# Patient Record
Sex: Male | Born: 1950 | Race: White | Hispanic: No | Smoking: Former smoker
Health system: Southern US, Community
[De-identification: ages and names within clinical notes are randomized; demographics above are authoritative.]

## PROBLEM LIST (undated history)

## (undated) DIAGNOSIS — I2510 Atherosclerotic heart disease of native coronary artery without angina pectoris: Secondary | ICD-10-CM

## (undated) DIAGNOSIS — J309 Allergic rhinitis, unspecified: Secondary | ICD-10-CM

## (undated) DIAGNOSIS — M791 Myalgia, unspecified site: Secondary | ICD-10-CM

## (undated) DIAGNOSIS — F32A Depression, unspecified: Secondary | ICD-10-CM

## (undated) DIAGNOSIS — C801 Malignant (primary) neoplasm, unspecified: Secondary | ICD-10-CM

## (undated) DIAGNOSIS — Z8719 Personal history of other diseases of the digestive system: Secondary | ICD-10-CM

## (undated) DIAGNOSIS — A048 Other specified bacterial intestinal infections: Secondary | ICD-10-CM

## (undated) DIAGNOSIS — R51 Headache: Secondary | ICD-10-CM

## (undated) DIAGNOSIS — K219 Gastro-esophageal reflux disease without esophagitis: Secondary | ICD-10-CM

## (undated) DIAGNOSIS — I5020 Unspecified systolic (congestive) heart failure: Secondary | ICD-10-CM

## (undated) DIAGNOSIS — E785 Hyperlipidemia, unspecified: Secondary | ICD-10-CM

## (undated) DIAGNOSIS — T8859XA Other complications of anesthesia, initial encounter: Secondary | ICD-10-CM

## (undated) DIAGNOSIS — J449 Chronic obstructive pulmonary disease, unspecified: Secondary | ICD-10-CM

## (undated) DIAGNOSIS — K649 Unspecified hemorrhoids: Secondary | ICD-10-CM

## (undated) DIAGNOSIS — E119 Type 2 diabetes mellitus without complications: Secondary | ICD-10-CM

## (undated) DIAGNOSIS — G4700 Insomnia, unspecified: Secondary | ICD-10-CM

## (undated) DIAGNOSIS — K279 Peptic ulcer, site unspecified, unspecified as acute or chronic, without hemorrhage or perforation: Secondary | ICD-10-CM

## (undated) DIAGNOSIS — R918 Other nonspecific abnormal finding of lung field: Secondary | ICD-10-CM

## (undated) DIAGNOSIS — F419 Anxiety disorder, unspecified: Secondary | ICD-10-CM

## (undated) DIAGNOSIS — M549 Dorsalgia, unspecified: Secondary | ICD-10-CM

## (undated) DIAGNOSIS — B353 Tinea pedis: Secondary | ICD-10-CM

## (undated) DIAGNOSIS — F329 Major depressive disorder, single episode, unspecified: Secondary | ICD-10-CM

## (undated) DIAGNOSIS — K469 Unspecified abdominal hernia without obstruction or gangrene: Secondary | ICD-10-CM

## (undated) DIAGNOSIS — K635 Polyp of colon: Secondary | ICD-10-CM

## (undated) DIAGNOSIS — I509 Heart failure, unspecified: Secondary | ICD-10-CM

## (undated) DIAGNOSIS — Z972 Presence of dental prosthetic device (complete) (partial): Secondary | ICD-10-CM

## (undated) DIAGNOSIS — I255 Ischemic cardiomyopathy: Secondary | ICD-10-CM

## (undated) DIAGNOSIS — K2289 Other specified disease of esophagus: Secondary | ICD-10-CM

## (undated) DIAGNOSIS — R1310 Dysphagia, unspecified: Secondary | ICD-10-CM

## (undated) DIAGNOSIS — Z9889 Other specified postprocedural states: Secondary | ICD-10-CM

## (undated) DIAGNOSIS — I10 Essential (primary) hypertension: Secondary | ICD-10-CM

## (undated) DIAGNOSIS — Z9981 Dependence on supplemental oxygen: Secondary | ICD-10-CM

## (undated) DIAGNOSIS — I2102 ST elevation (STEMI) myocardial infarction involving left anterior descending coronary artery: Secondary | ICD-10-CM

## (undated) DIAGNOSIS — Z8489 Family history of other specified conditions: Secondary | ICD-10-CM

## (undated) HISTORY — DX: Headache: R51

## (undated) HISTORY — DX: Hyperlipidemia, unspecified: E78.5

## (undated) HISTORY — DX: Chronic obstructive pulmonary disease, unspecified: J44.9

## (undated) HISTORY — PX: COLONOSCOPY: SHX174

## (undated) HISTORY — DX: Myalgia, unspecified site: M79.10

## (undated) HISTORY — DX: Tinea pedis: B35.3

## (undated) HISTORY — DX: Insomnia, unspecified: G47.00

## (undated) HISTORY — PX: ESOPHAGEAL DILATION: SHX303

## (undated) HISTORY — DX: Depression, unspecified: F32.A

## (undated) HISTORY — DX: Malignant (primary) neoplasm, unspecified: C80.1

## (undated) HISTORY — PX: CARDIAC CATHETERIZATION: SHX172

## (undated) HISTORY — DX: Atherosclerotic heart disease of native coronary artery without angina pectoris: I25.10

## (undated) HISTORY — DX: Unspecified hemorrhoids: K64.9

## (undated) HISTORY — DX: Dorsalgia, unspecified: M54.9

## (undated) HISTORY — DX: Unspecified abdominal hernia without obstruction or gangrene: K46.9

## (undated) HISTORY — DX: Personal history of other diseases of the digestive system: Z87.19

## (undated) HISTORY — PX: OTHER SURGICAL HISTORY: SHX169

## (undated) HISTORY — DX: Peptic ulcer, site unspecified, unspecified as acute or chronic, without hemorrhage or perforation: K27.9

## (undated) HISTORY — DX: Allergic rhinitis, unspecified: J30.9

## (undated) HISTORY — DX: Type 2 diabetes mellitus without complications: E11.9

## (undated) HISTORY — DX: Gastro-esophageal reflux disease without esophagitis: K21.9

## (undated) HISTORY — DX: Unspecified systolic (congestive) heart failure: I50.20

## (undated) HISTORY — DX: Anxiety disorder, unspecified: F41.9

## (undated) HISTORY — PX: PENILE PROSTHESIS IMPLANT: SHX240

## (undated) HISTORY — DX: Major depressive disorder, single episode, unspecified: F32.9

## (undated) HISTORY — DX: Other specified bacterial intestinal infections: A04.8

## (undated) HISTORY — DX: Essential (primary) hypertension: I10

## (undated) HISTORY — DX: Polyp of colon: K63.5

## (undated) HISTORY — DX: Other specified postprocedural states: Z98.890

## (undated) MED FILL — Dexamethasone Sodium Phosphate Inj 100 MG/10ML: INTRAMUSCULAR | Qty: 1 | Status: AC

---

## 1998-12-20 DIAGNOSIS — Z8719 Personal history of other diseases of the digestive system: Secondary | ICD-10-CM

## 1998-12-20 HISTORY — DX: Personal history of other diseases of the digestive system: Z87.19

## 2005-08-31 ENCOUNTER — Ambulatory Visit: Payer: Self-pay | Admitting: Internal Medicine

## 2005-08-31 IMAGING — RF DG UGI W/O KUB
1 series · 15 of 19 positions shown · non-contrast
Comparison: none

REASON FOR EXAM: reflux
COMMENTS:

[Series 1: run · 15 of 19 slices shown]
[im 1/19]
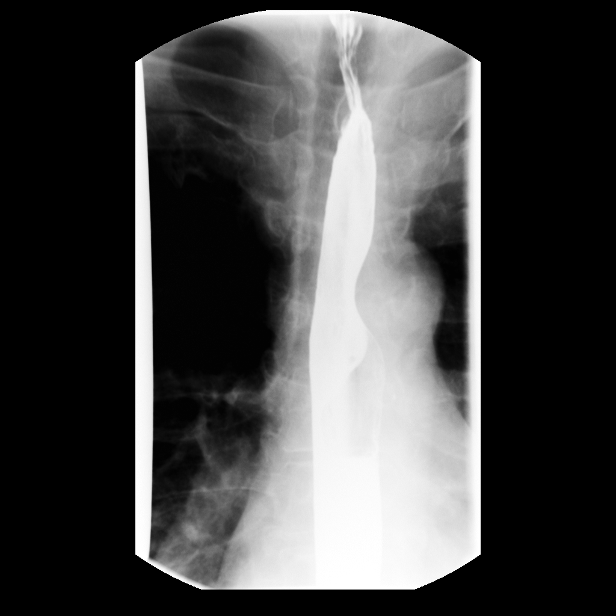
[im 2/19]
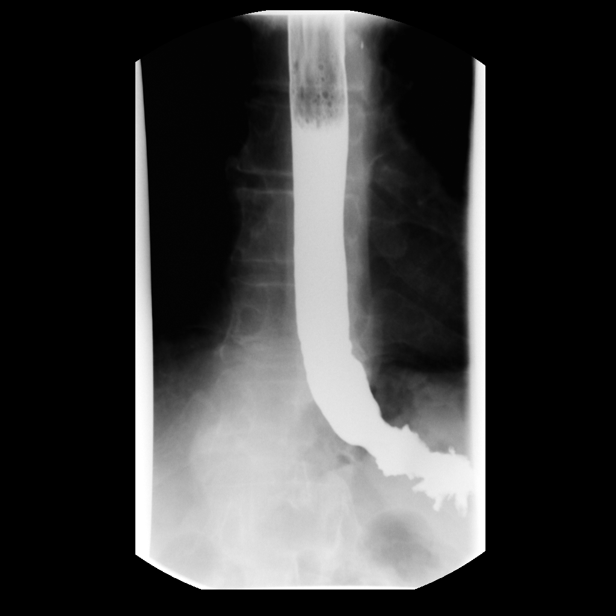
[im 4/19]
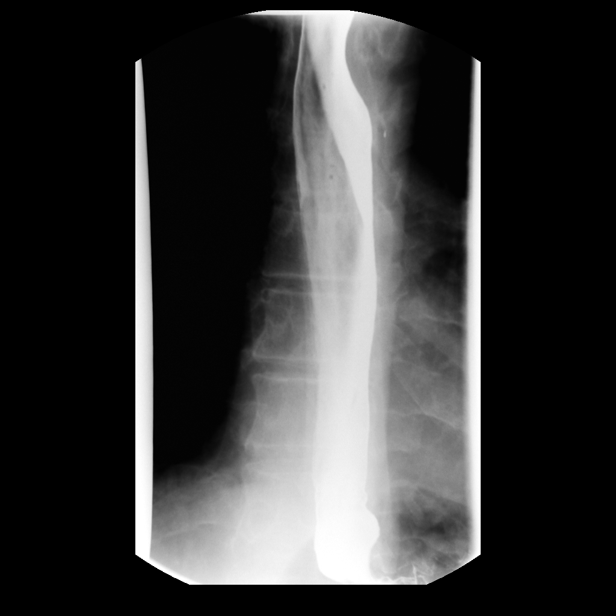
[im 5/19]
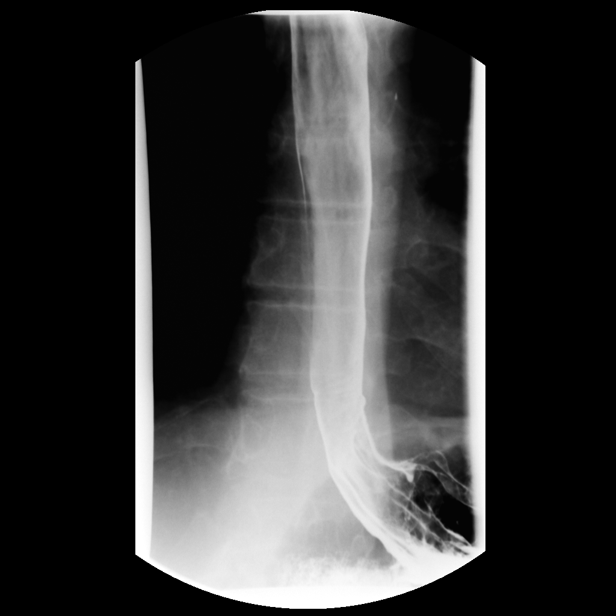
[im 6/19]
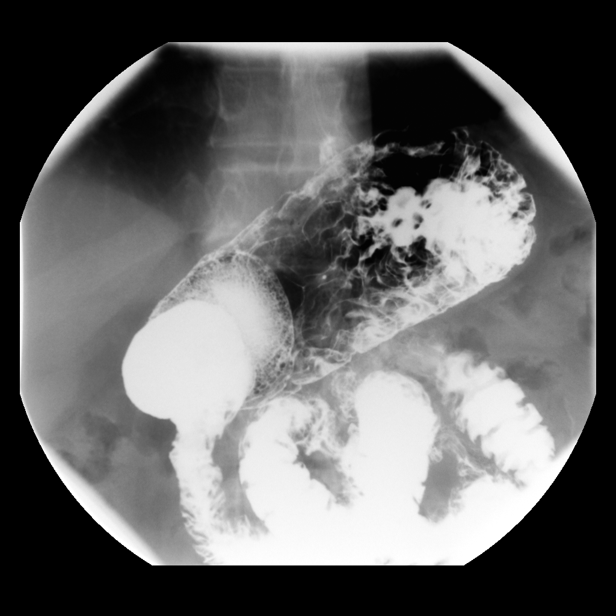
[im 7/19]
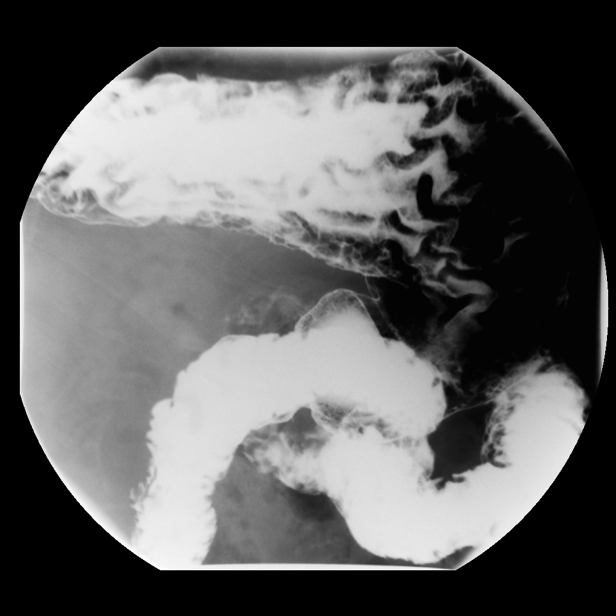
[im 9/19]
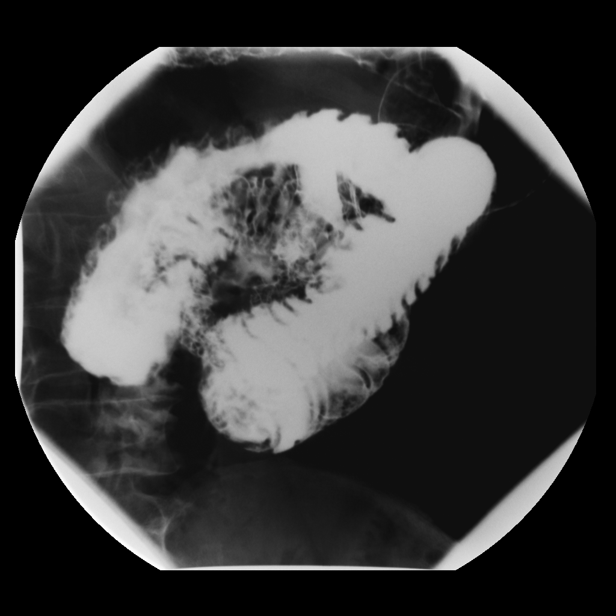
[im 10/19]
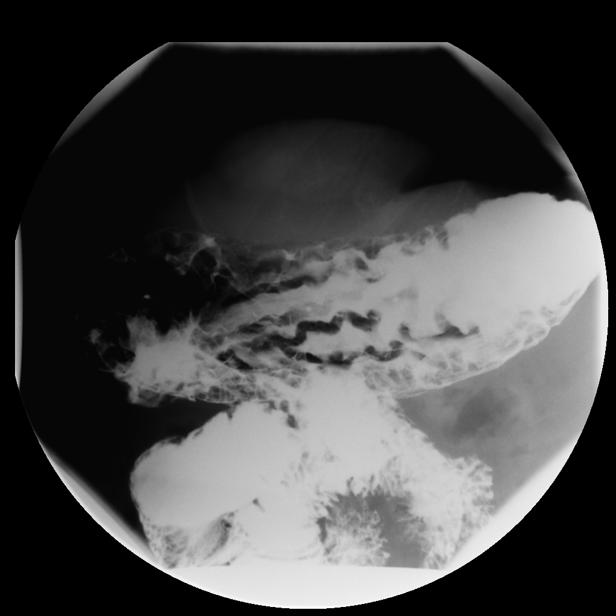
[im 11/19]
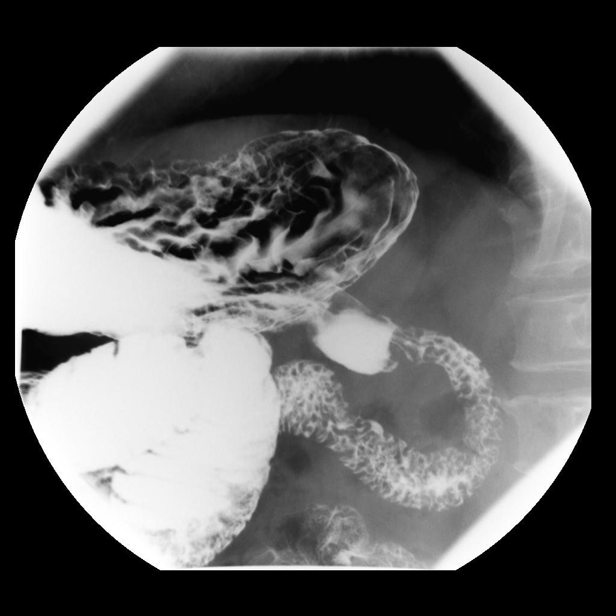
[im 13/19]
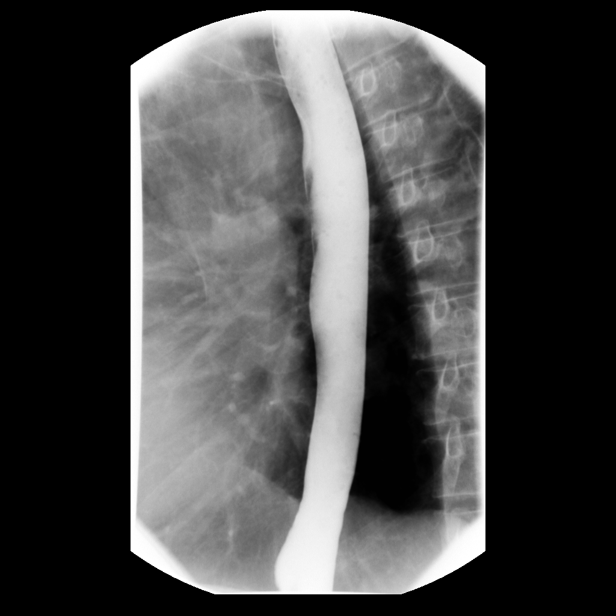
[im 14/19]
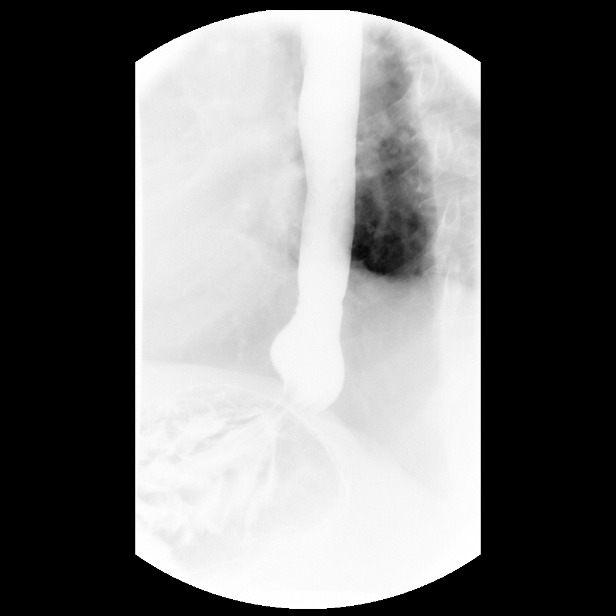
[im 15/19]
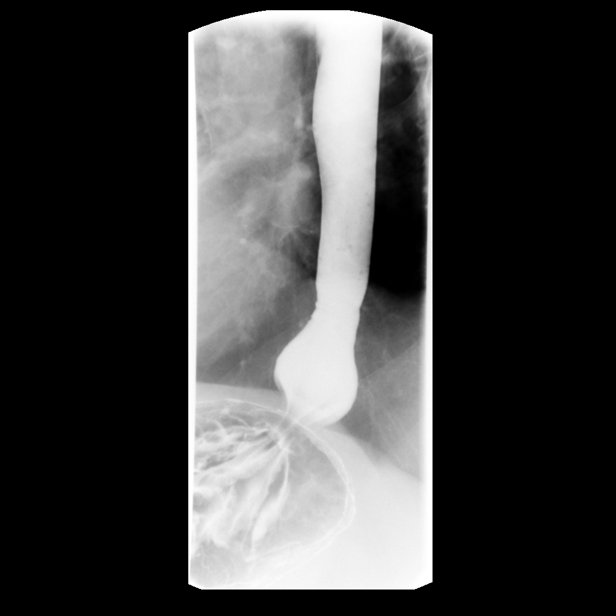
[im 16/19]
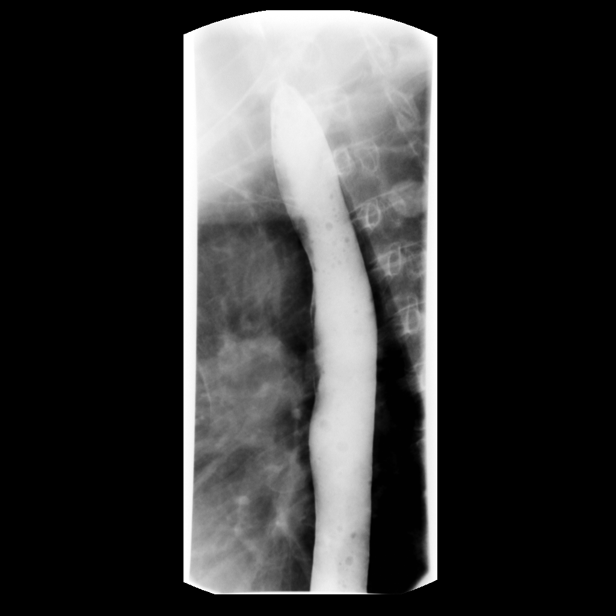
[im 18/19]
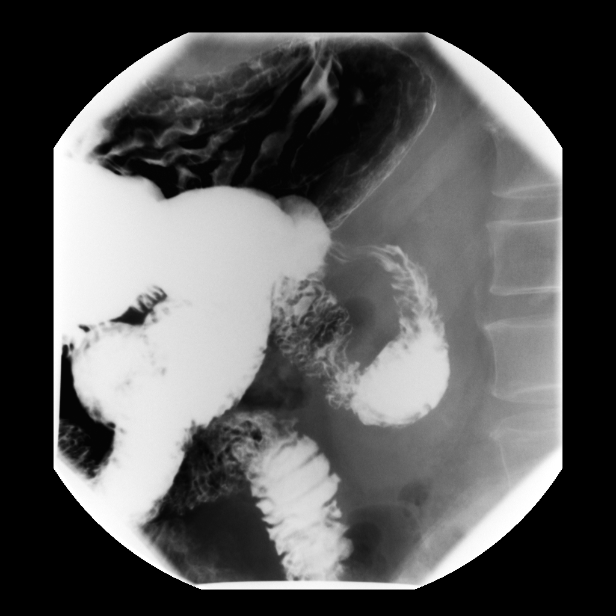
[im 19/19]
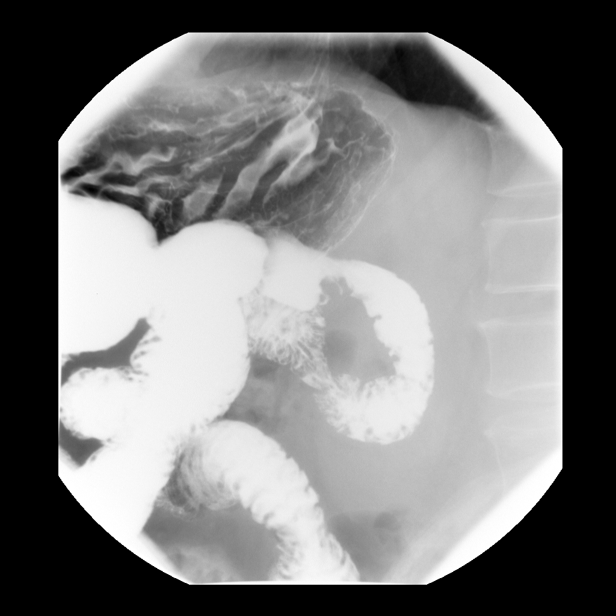

[15 of 19 positions shown; findings below may reference images not displayed]

PROCEDURE:     FL  - FL UPPER GI  - [DATE]  [DATE]

RESULT:     Barium passed through the esophagus unobstructed with normal
peristaltic activity. No hiatal hernia is identified. No gastroesophageal
reflux could be elicited on this exam.

In the stomach the folds appear mildly prominent, which could indicate
gastritis, however, no ulcer craters are seen.  The barium emptied into the
duodenal bulb without delay. No ulcer craters are identified in the duodenal
bulb or duodenal C-loop.
IMPRESSION: 1)Some mild prominence of the gastric mucosal folds.  Findings may be
compatible with mild gastritis. No ulcer craters are seen and no
gastroesophageal reflux is noted.

## 2006-12-20 HISTORY — PX: ESOPHAGOGASTRODUODENOSCOPY: SHX1529

## 2007-04-05 ENCOUNTER — Ambulatory Visit: Payer: Self-pay | Admitting: Gastroenterology

## 2009-01-27 ENCOUNTER — Ambulatory Visit: Payer: Self-pay | Admitting: Family Medicine

## 2009-01-27 IMAGING — CR DG CHEST 2V
1 series · 4 of 4 positions shown · non-contrast
Comparison: none

REASON FOR EXAM: SOB
COMMENTS:

PROCEDURE:     KDR - KDXR CHEST PA (OR AP) AND LAT  - [DATE]  [DATE]
RESULT:     There is flattening of the diaphragms and increased AP diameter
of the chest. No focal region of consolidation or focal infiltrates
appreciated.

[Series 1: view not recorded · 0.17mm/px · 4 of 4 slices shown]
[im 1/4]
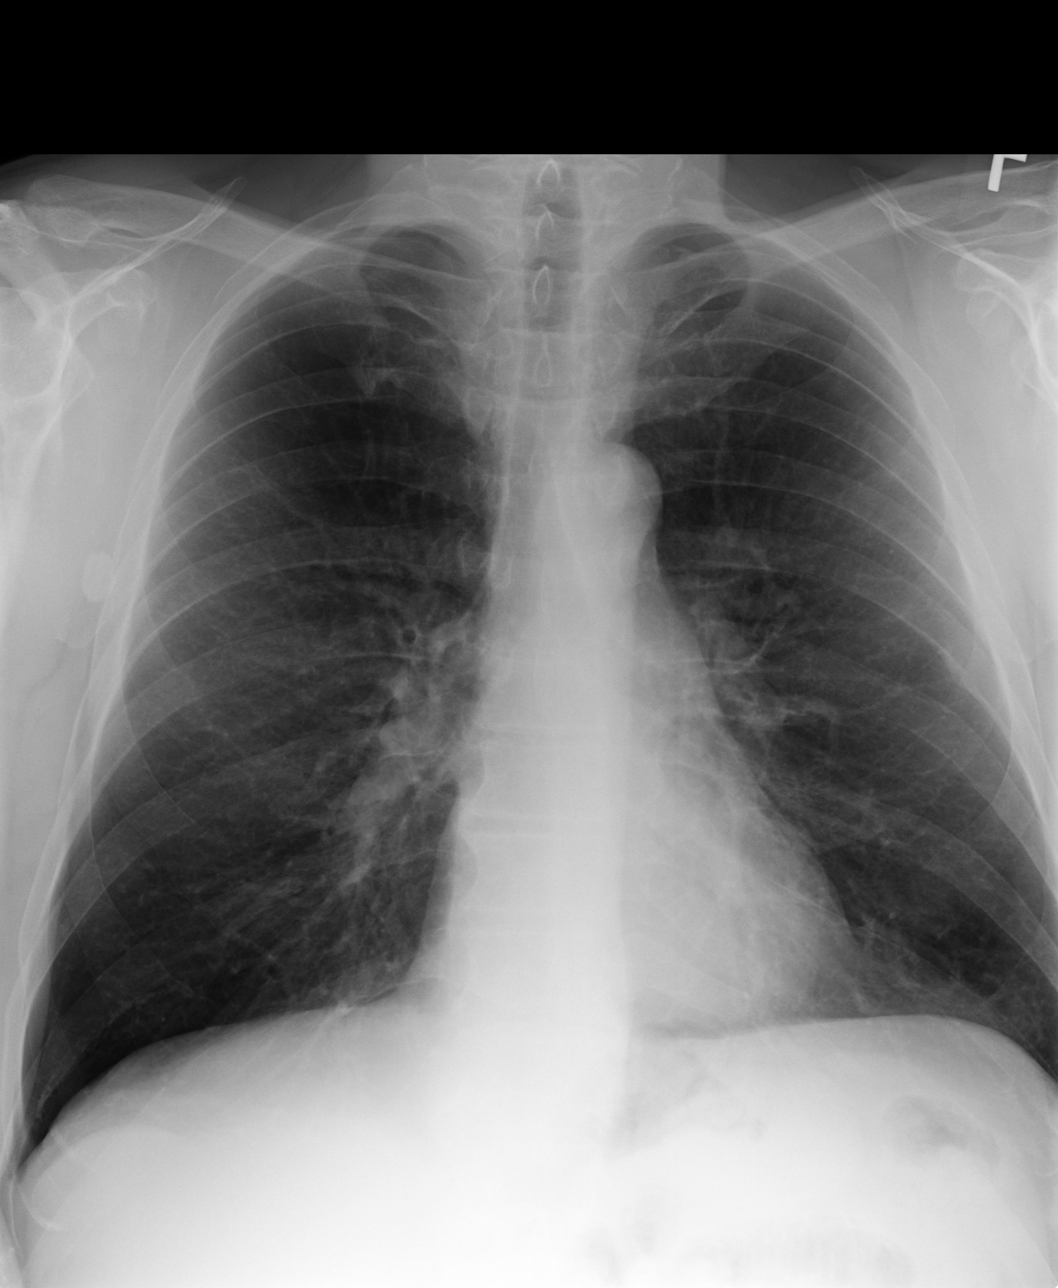
[im 2/4]
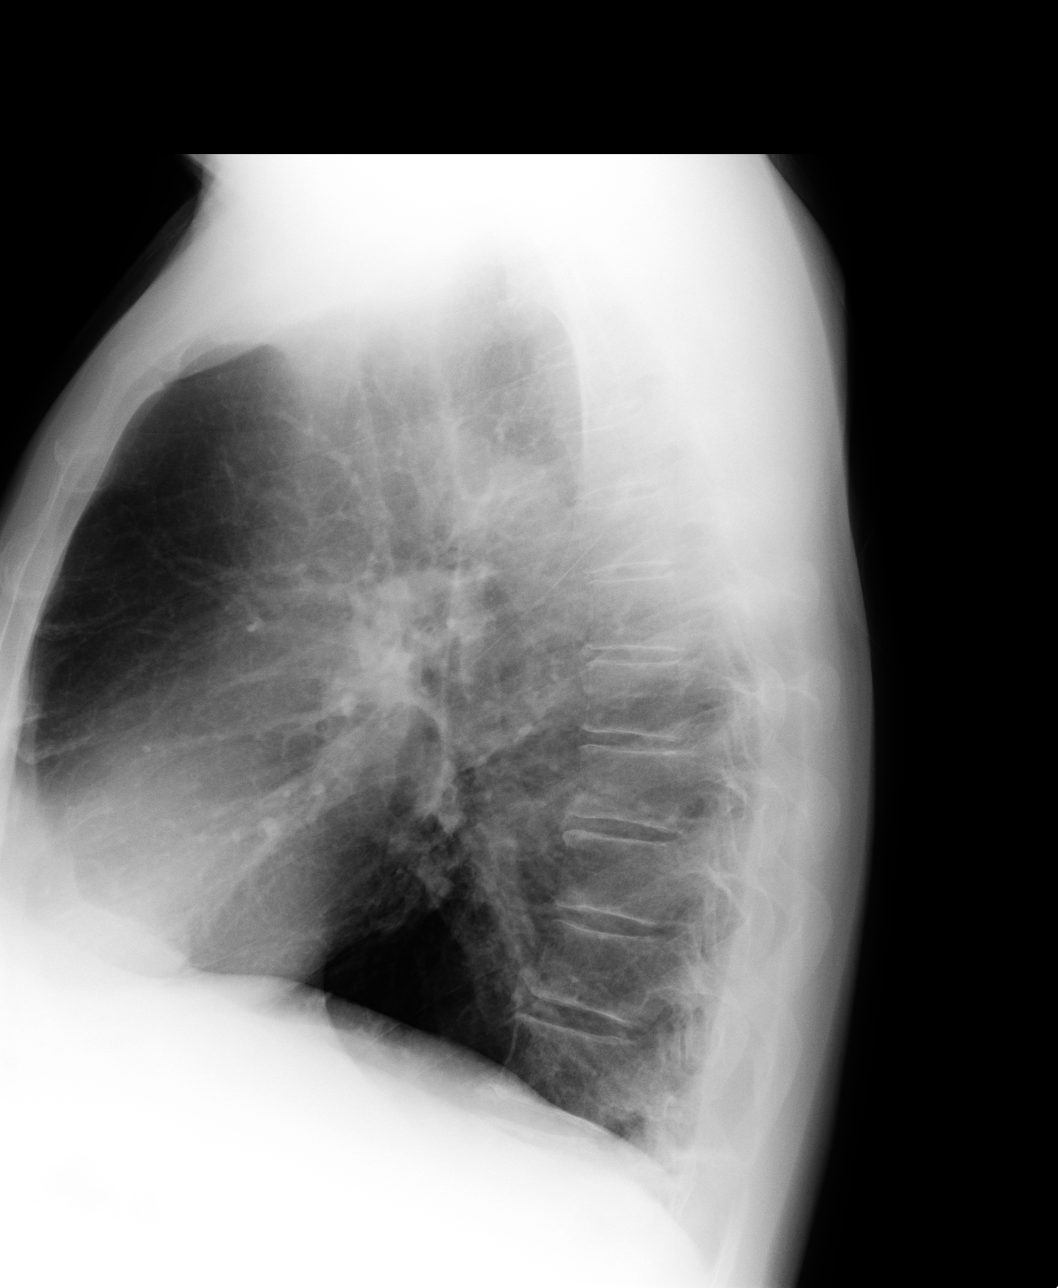
[im 3/4]
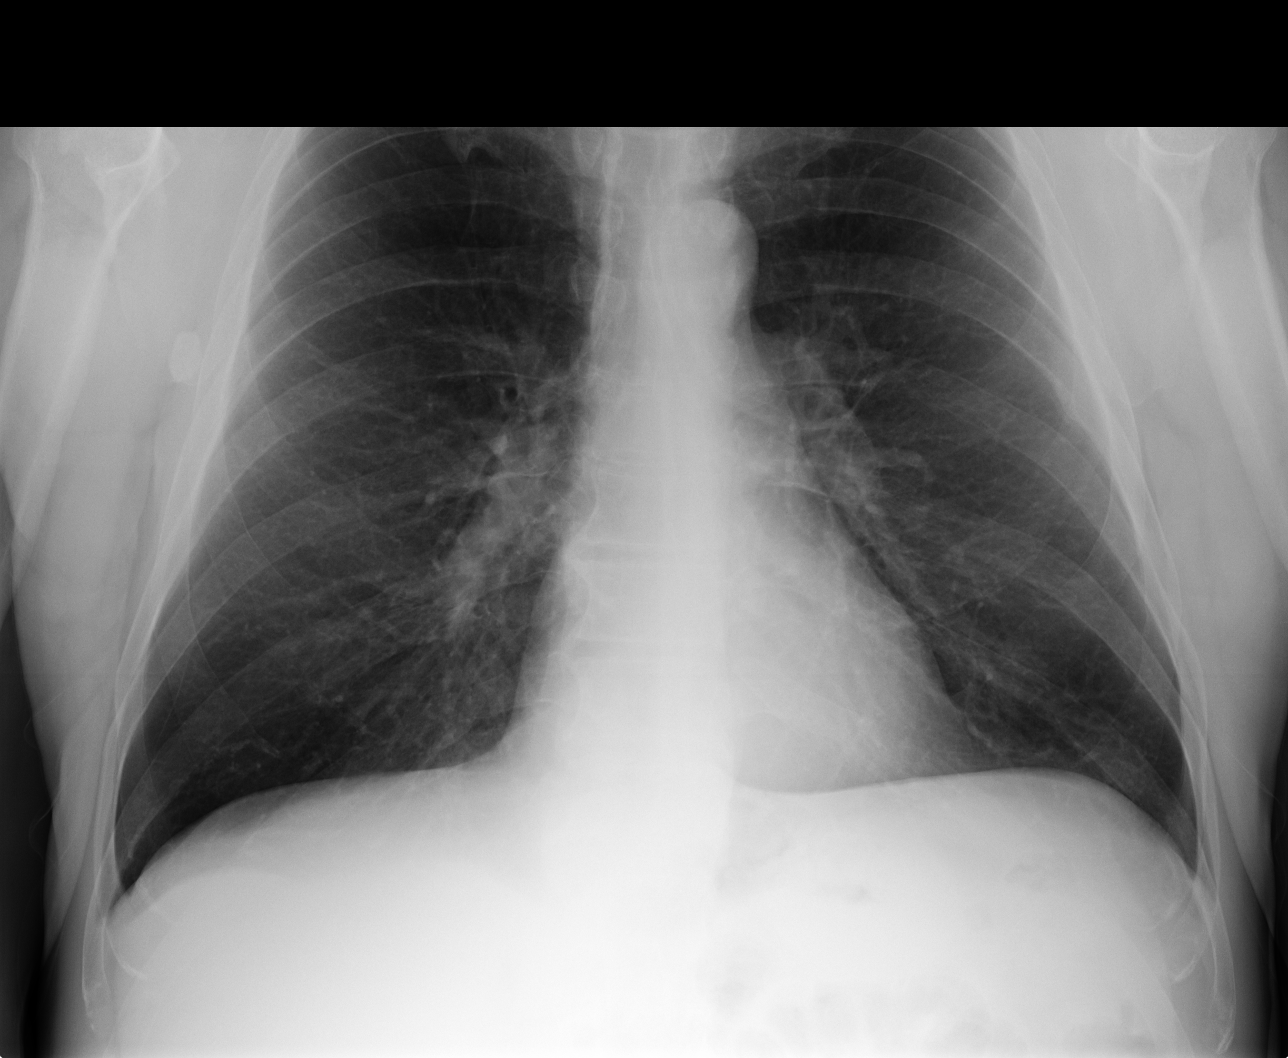
[im 4/4]
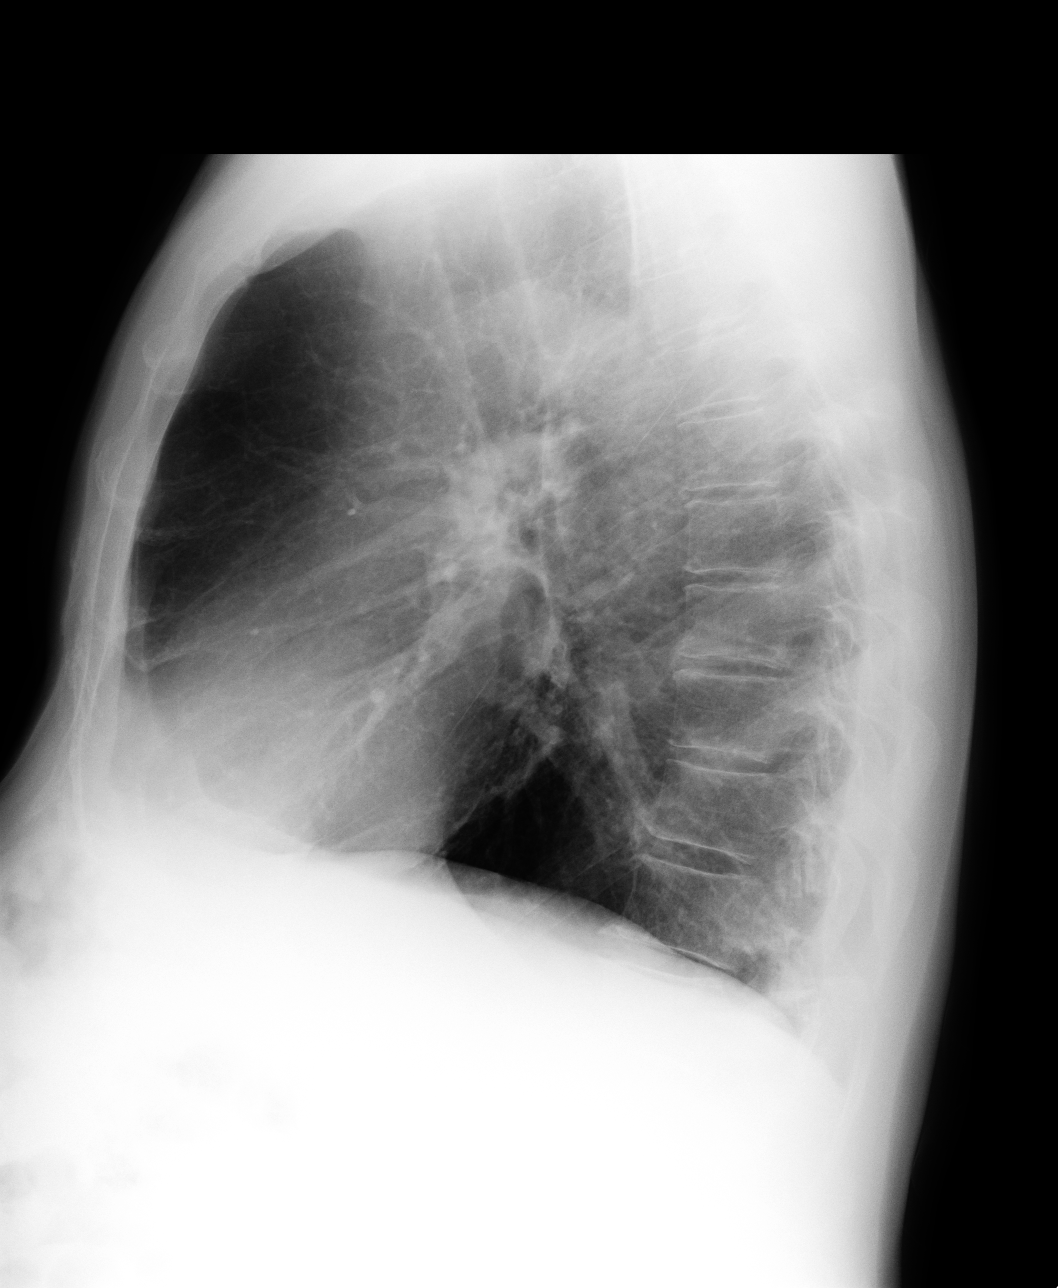

[4 of 4 positions shown; findings below may reference images not displayed]

IMPRESSION: Findings consistent with mild to moderate COPD without evidence of acute
cardiopulmonary disease.

## 2009-09-19 HISTORY — PX: CORONARY ANGIOPLASTY WITH STENT PLACEMENT: SHX49

## 2009-11-19 ENCOUNTER — Ambulatory Visit: Payer: Self-pay | Admitting: Family Medicine

## 2009-11-19 ENCOUNTER — Encounter: Payer: Self-pay | Admitting: Cardiology

## 2009-11-19 DIAGNOSIS — I2102 ST elevation (STEMI) myocardial infarction involving left anterior descending coronary artery: Secondary | ICD-10-CM

## 2009-11-19 DIAGNOSIS — I2510 Atherosclerotic heart disease of native coronary artery without angina pectoris: Secondary | ICD-10-CM

## 2009-11-19 HISTORY — DX: ST elevation (STEMI) myocardial infarction involving left anterior descending coronary artery: I21.02

## 2009-11-19 HISTORY — DX: Atherosclerotic heart disease of native coronary artery without angina pectoris: I25.10

## 2009-11-19 IMAGING — CR DG CHEST 2V
1 series · 3 of 3 positions shown · non-contrast
Comparison: none

REASON FOR EXAM: cough, shortness of breath
COMMENTS:

[Series 1: view not recorded · 0.17mm/px · 3 of 3 slices shown]
[im 1/3]
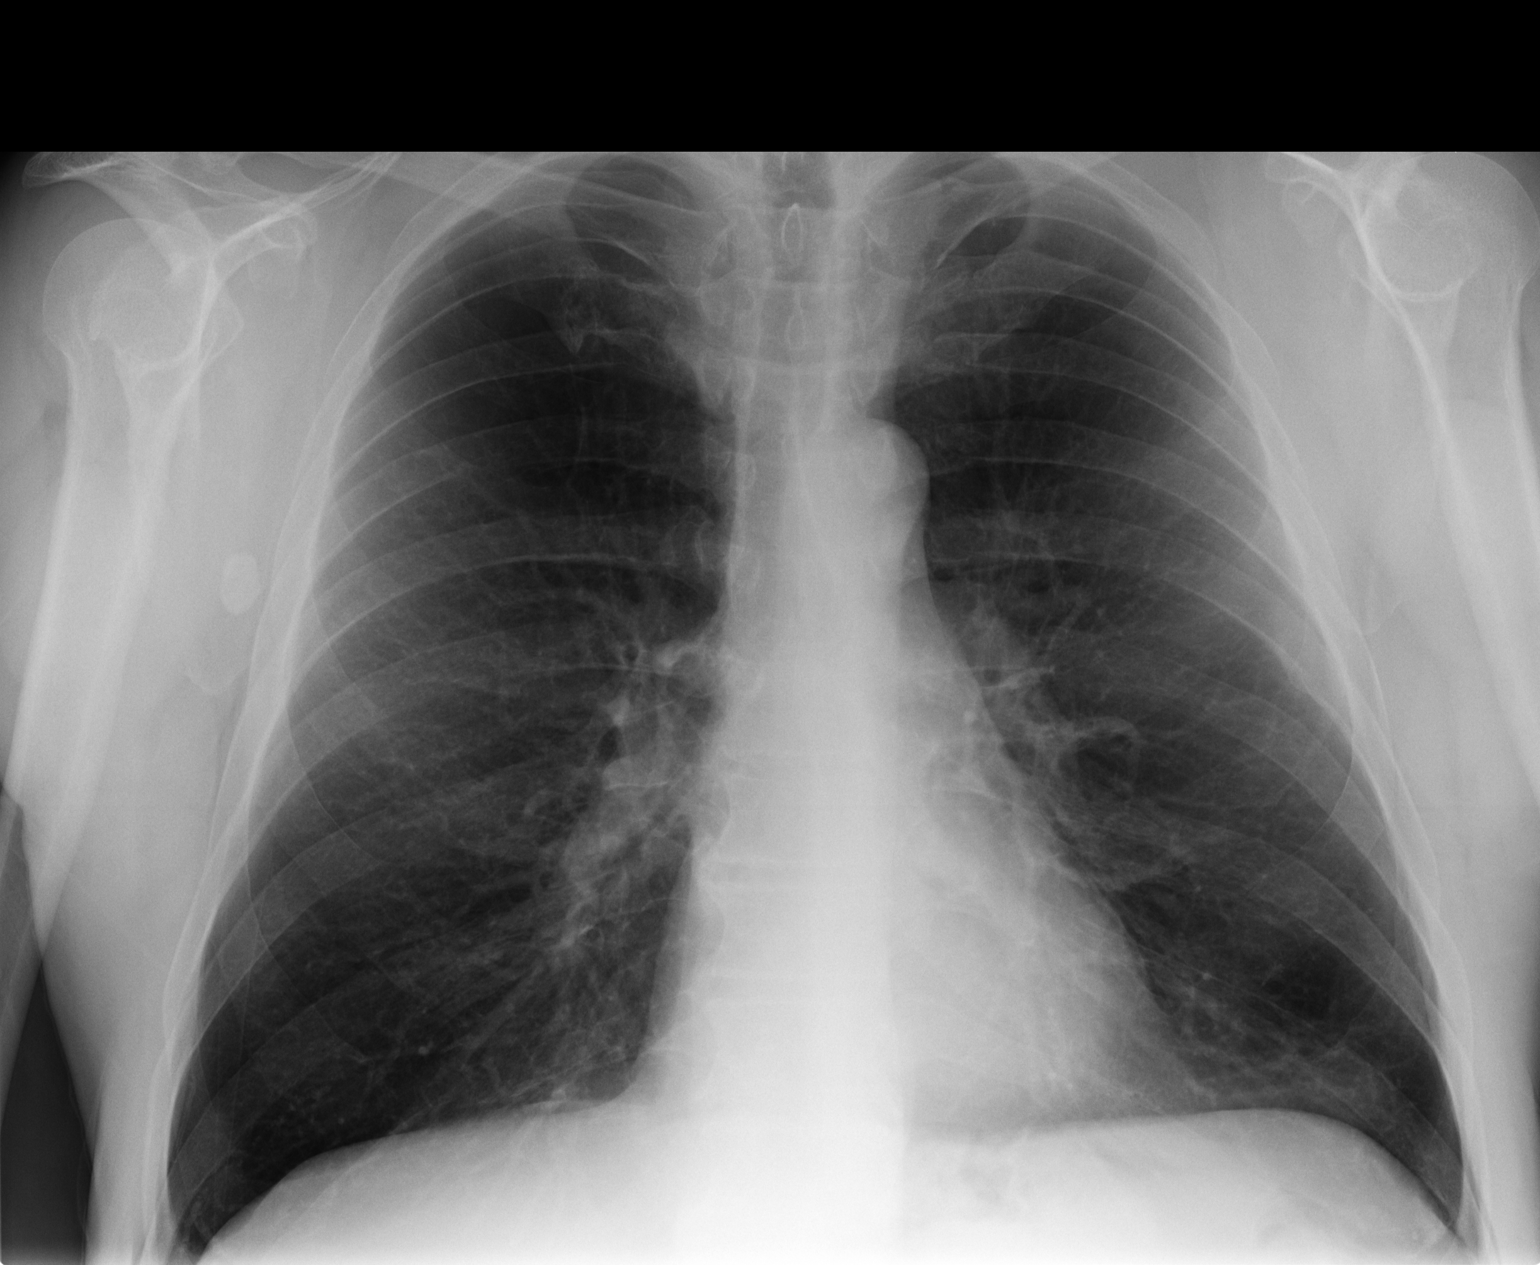
[im 2/3]
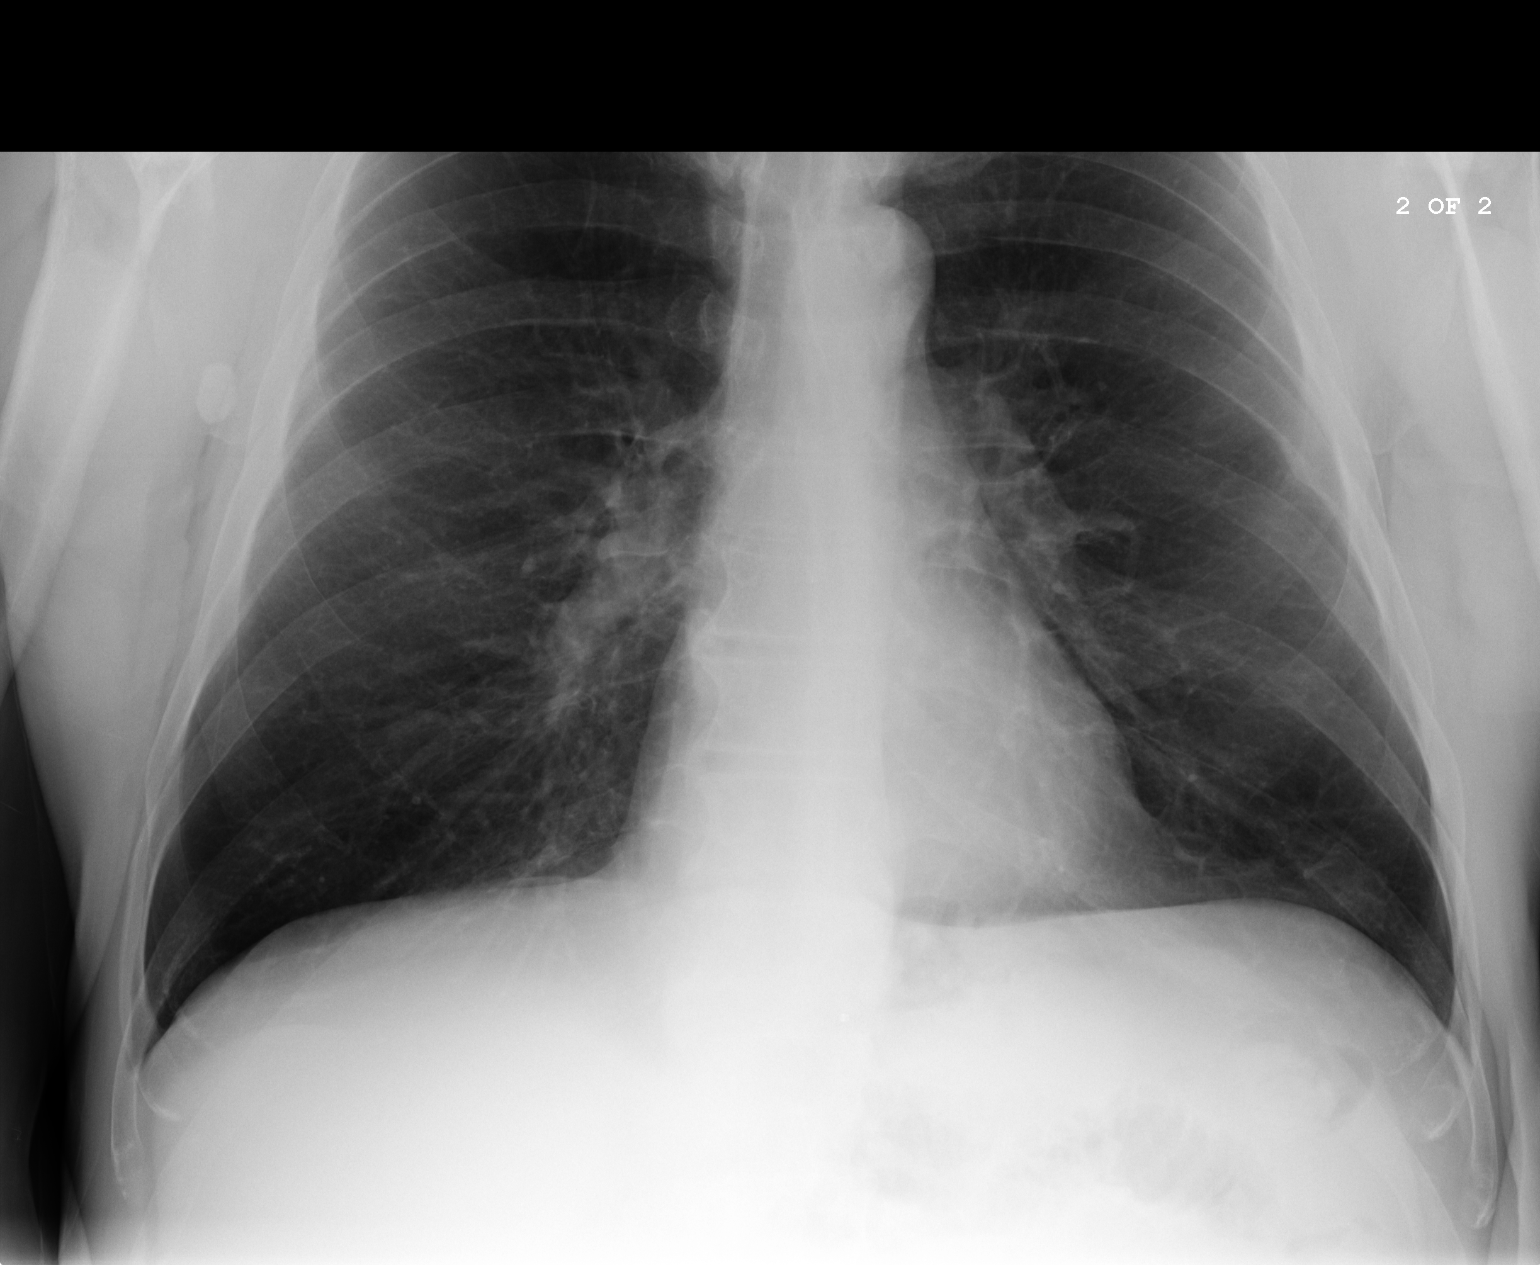
[im 3/3]
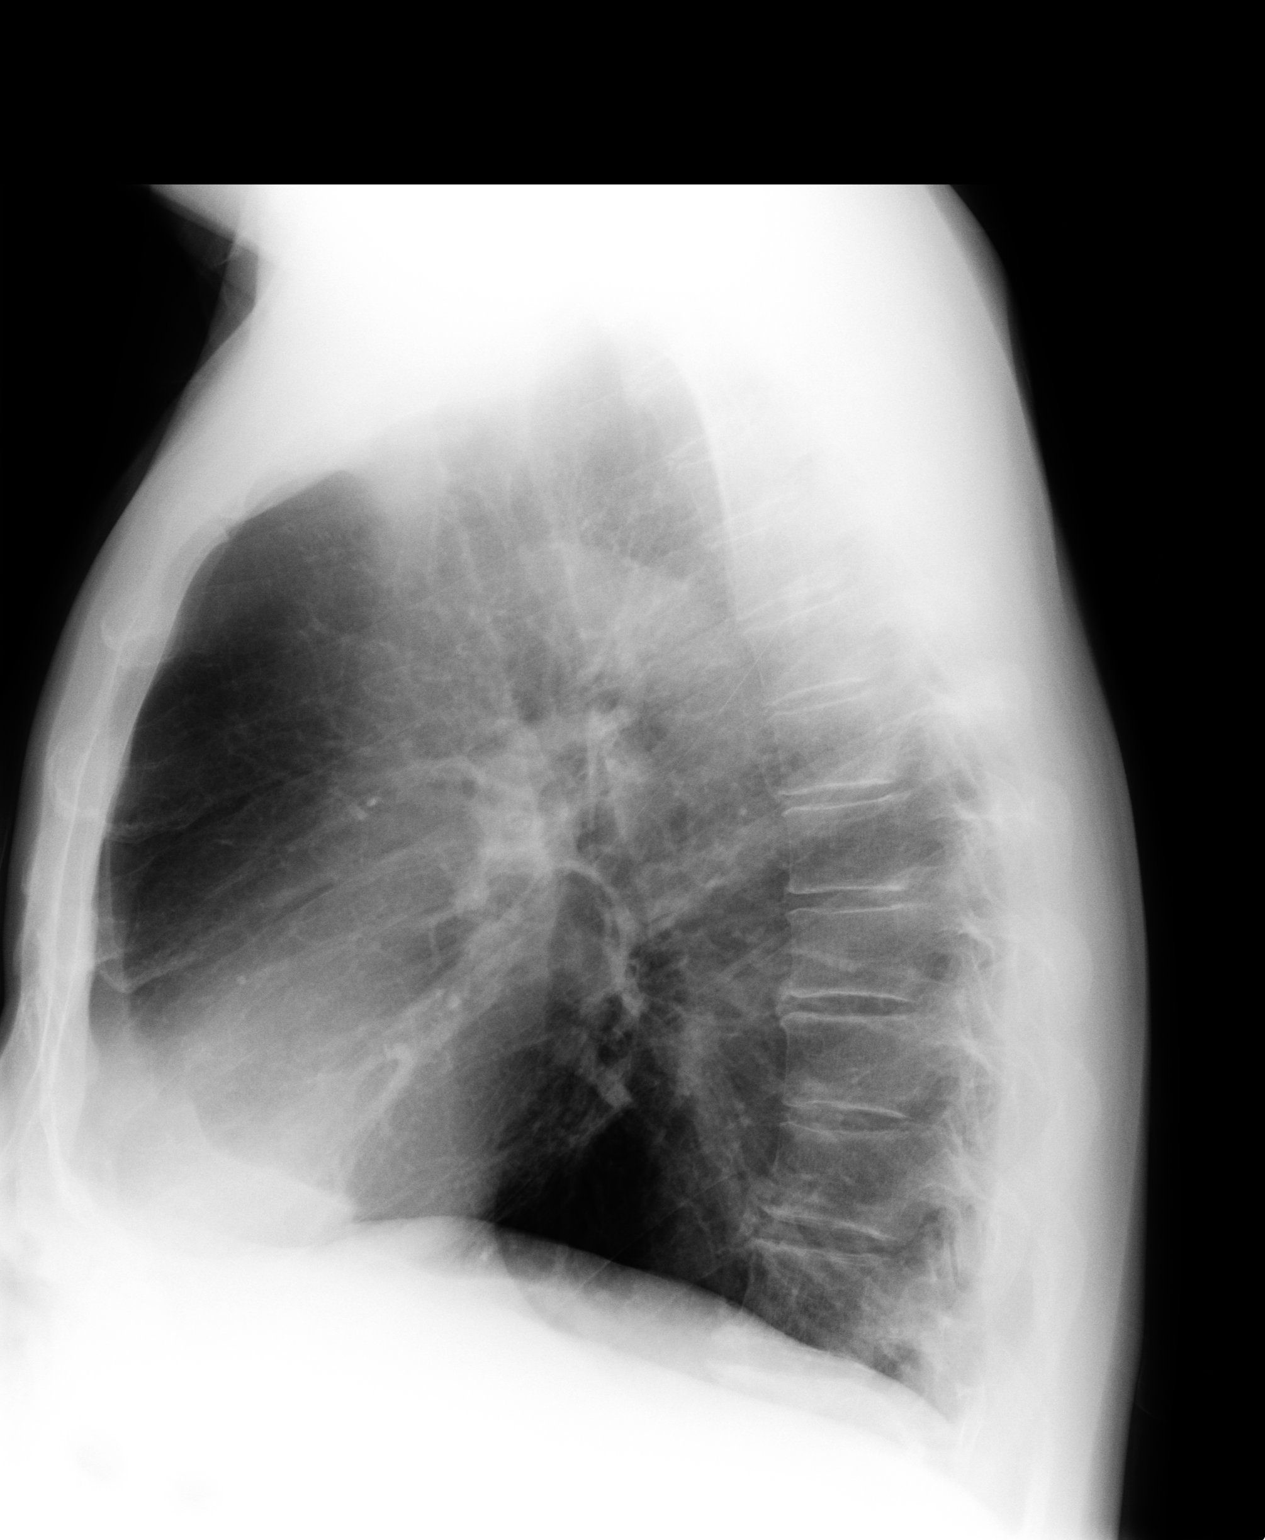

[3 of 3 positions shown; findings below may reference images not displayed]

PROCEDURE:     KDR - KDXR CHEST PA (OR AP) AND LAT  - [DATE]  [DATE]

RESULT:     Comparison is made to a prior exam of [DATE].

The lung fields are clear. No pneumonia, pneumothorax or pleural effusion is
seen. The heart size is normal. The mediastinal and osseous structures show
no significant abnormalities. The chest appears mildly hyperexpanded
suspicious for a history of COPD or asthma.
IMPRESSION: 1.  Stable appearing chest as compared to the exam of [DATE].
[DATE].  The lung fields are clear.
3.  The chest appears mildly hyperexpanded consistent with a history of COPD.

## 2009-11-21 ENCOUNTER — Ambulatory Visit: Payer: Self-pay | Admitting: Cardiology

## 2009-11-21 DIAGNOSIS — R079 Chest pain, unspecified: Secondary | ICD-10-CM | POA: Insufficient documentation

## 2009-11-21 DIAGNOSIS — I10 Essential (primary) hypertension: Secondary | ICD-10-CM | POA: Insufficient documentation

## 2009-11-26 ENCOUNTER — Telehealth (INDEPENDENT_AMBULATORY_CARE_PROVIDER_SITE_OTHER): Payer: Self-pay | Admitting: *Deleted

## 2009-11-27 ENCOUNTER — Telehealth (INDEPENDENT_AMBULATORY_CARE_PROVIDER_SITE_OTHER): Payer: Self-pay

## 2009-12-04 ENCOUNTER — Telehealth (INDEPENDENT_AMBULATORY_CARE_PROVIDER_SITE_OTHER): Payer: Self-pay | Admitting: *Deleted

## 2009-12-09 ENCOUNTER — Inpatient Hospital Stay: Payer: Self-pay | Admitting: Cardiovascular Disease

## 2009-12-09 IMAGING — CR DG CHEST 1V PORT
1 series · 1 of 1 positions shown · non-contrast
Comparison: none

REASON FOR EXAM: Chest Pain
COMMENTS:

PROCEDURE:     DXR - DXR PORTABLE CHEST SINGLE VIEW  - [DATE]  [DATE]
RESULT:     Comparison is made to the prior exam [DATE]. The lung fields
remain clear of infiltrate. Heart size is normal. The mediastinal and
osseous structures are normal in appearance. Monitoring electrodes are
present.

[view not recorded]
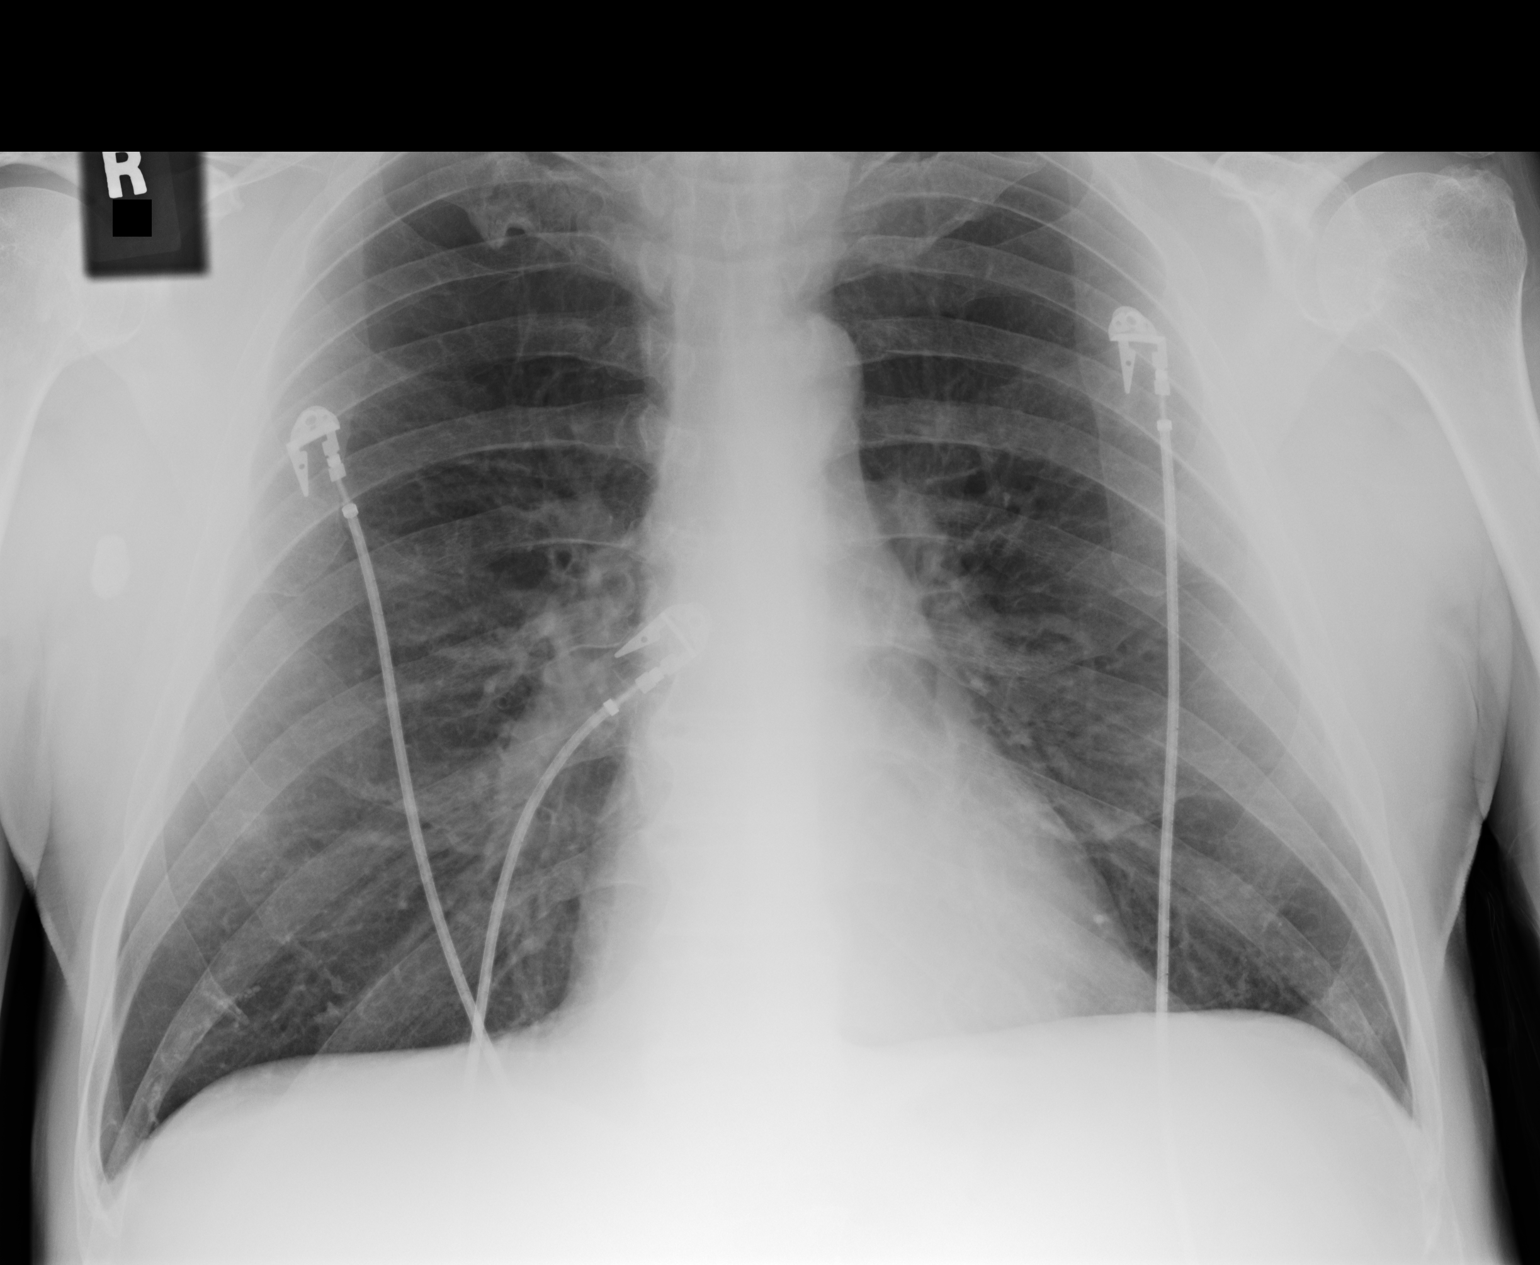

[1 of 1 positions shown; findings below may reference images not displayed]

IMPRESSION: No acute changes are identified.

## 2009-12-16 ENCOUNTER — Telehealth (INDEPENDENT_AMBULATORY_CARE_PROVIDER_SITE_OTHER): Payer: Self-pay | Admitting: *Deleted

## 2010-01-15 ENCOUNTER — Encounter: Payer: Self-pay | Admitting: Cardiovascular Disease

## 2010-01-15 LAB — CONVERTED CEMR LAB
ALT: 20 U/L
AST: 15 U/L
Albumin: 4.3 g/dL
Alkaline Phosphatase: 79 U/L
BUN: 12 mg/dL
Basophils Relative: 0 %
CO2: 31 meq/L
Calcium: 9.8 mg/dL
Chloride: 103 meq/L
Cholesterol: 120 mg/dL
Creatinine, Ser: 0.77 mg/dL
Eosinophils Relative: 3 %
Glucose, Bld: 100 mg/dL
HCT: 45.6 %
HDL: 37 mg/dL
Hemoglobin: 14.9 g/dL
LDL Cholesterol: 70 mg/dL
Lymphocytes, automated: 25 %
MCV: 90.1 fL
Monocytes Relative: 10 %
Neutrophils Relative %: 63 %
Platelets: 279 K/uL
Potassium: 4.5 meq/L
RBC: 5.06 M/uL
RDW: 12.6 %
Retic Ct Pct: 0 %
Sodium: 141 meq/L
Total Bilirubin: 0.5 mg/dL
Total Protein: 6.4 g/dL
Triglyceride fasting, serum: 65 mg/dL
WBC: 9 10*3/microliter

## 2010-02-04 ENCOUNTER — Encounter: Payer: Self-pay | Admitting: Cardiovascular Disease

## 2010-02-17 ENCOUNTER — Encounter: Payer: Self-pay | Admitting: Cardiovascular Disease

## 2010-02-26 ENCOUNTER — Encounter: Payer: Self-pay | Admitting: Cardiovascular Disease

## 2010-03-20 ENCOUNTER — Encounter: Payer: Self-pay | Admitting: Cardiovascular Disease

## 2010-03-24 ENCOUNTER — Encounter: Payer: Self-pay | Admitting: Cardiovascular Disease

## 2010-03-24 DIAGNOSIS — I5022 Chronic systolic (congestive) heart failure: Secondary | ICD-10-CM | POA: Insufficient documentation

## 2010-03-24 DIAGNOSIS — J449 Chronic obstructive pulmonary disease, unspecified: Secondary | ICD-10-CM | POA: Insufficient documentation

## 2010-03-24 DIAGNOSIS — E785 Hyperlipidemia, unspecified: Secondary | ICD-10-CM | POA: Insufficient documentation

## 2010-03-24 DIAGNOSIS — F341 Dysthymic disorder: Secondary | ICD-10-CM | POA: Insufficient documentation

## 2010-03-24 DIAGNOSIS — I5032 Chronic diastolic (congestive) heart failure: Secondary | ICD-10-CM | POA: Insufficient documentation

## 2010-03-24 DIAGNOSIS — D126 Benign neoplasm of colon, unspecified: Secondary | ICD-10-CM | POA: Insufficient documentation

## 2010-03-24 DIAGNOSIS — K219 Gastro-esophageal reflux disease without esophagitis: Secondary | ICD-10-CM | POA: Insufficient documentation

## 2010-04-23 ENCOUNTER — Ambulatory Visit: Payer: Self-pay | Admitting: Cardiovascular Disease

## 2010-04-23 DIAGNOSIS — I2510 Atherosclerotic heart disease of native coronary artery without angina pectoris: Secondary | ICD-10-CM | POA: Insufficient documentation

## 2010-04-28 ENCOUNTER — Ambulatory Visit: Payer: Self-pay | Admitting: Cardiovascular Disease

## 2010-04-29 ENCOUNTER — Telehealth: Payer: Self-pay | Admitting: Cardiovascular Disease

## 2010-06-01 ENCOUNTER — Telehealth: Payer: Self-pay | Admitting: Cardiovascular Disease

## 2010-06-22 ENCOUNTER — Telehealth: Payer: Self-pay | Admitting: Nurse Practitioner

## 2010-07-01 ENCOUNTER — Ambulatory Visit: Payer: Self-pay | Admitting: Cardiovascular Disease

## 2010-07-30 ENCOUNTER — Ambulatory Visit: Payer: Self-pay | Admitting: Cardiovascular Disease

## 2010-07-30 DIAGNOSIS — F17200 Nicotine dependence, unspecified, uncomplicated: Secondary | ICD-10-CM | POA: Insufficient documentation

## 2010-08-26 ENCOUNTER — Encounter: Payer: Self-pay | Admitting: Cardiovascular Disease

## 2010-08-28 ENCOUNTER — Telehealth: Payer: Self-pay | Admitting: Cardiovascular Disease

## 2010-10-01 ENCOUNTER — Telehealth: Payer: Self-pay | Admitting: Cardiovascular Disease

## 2010-10-02 ENCOUNTER — Ambulatory Visit: Payer: Self-pay | Admitting: Family Medicine

## 2010-10-02 IMAGING — CR DG CHEST 2V
1 series · 3 of 3 positions shown · non-contrast
Comparison: none

REASON FOR EXAM: shortness of breath cough
COMMENTS:

PROCEDURE:     KDR - KDXR CHEST PA (OR AP) AND LAT  - [DATE]  [DATE]
RESULT:     Comparison: [DATE]

[Series 1: view not recorded · 0.17mm/px · 3 of 3 slices shown]
[im 1/3]
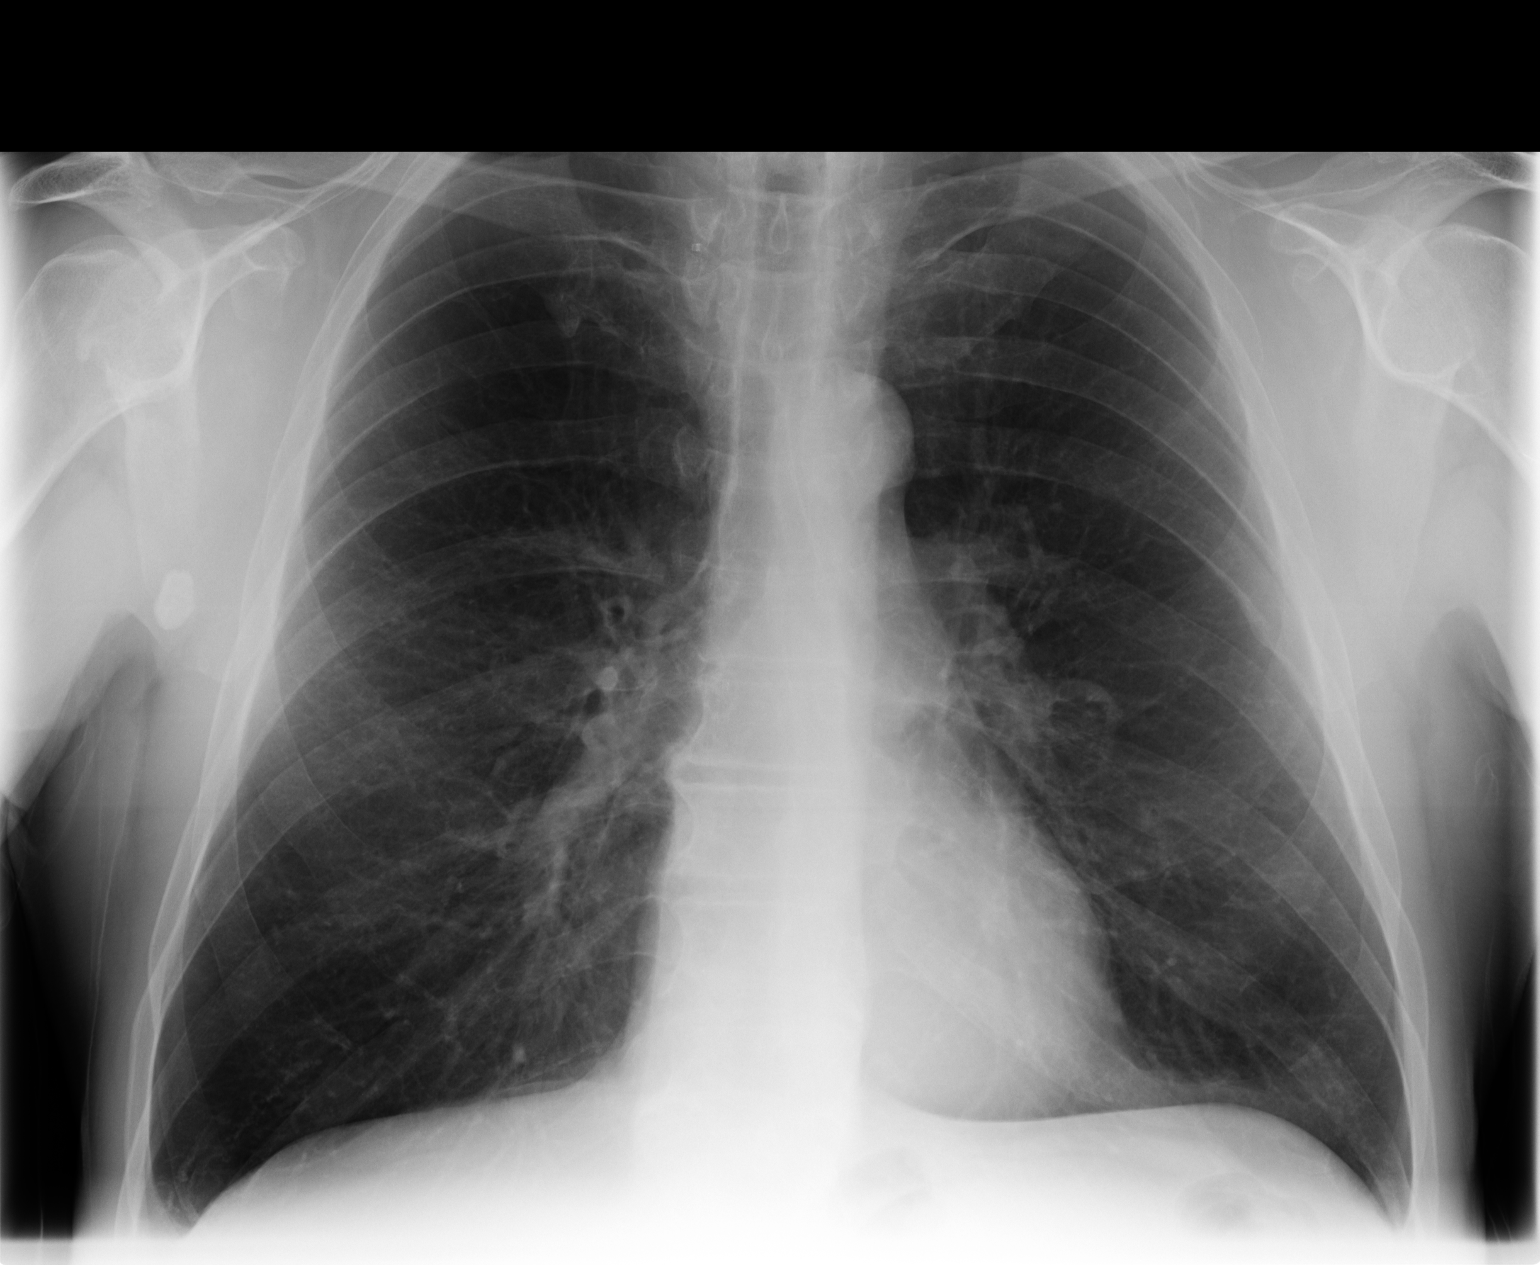
[im 2/3]
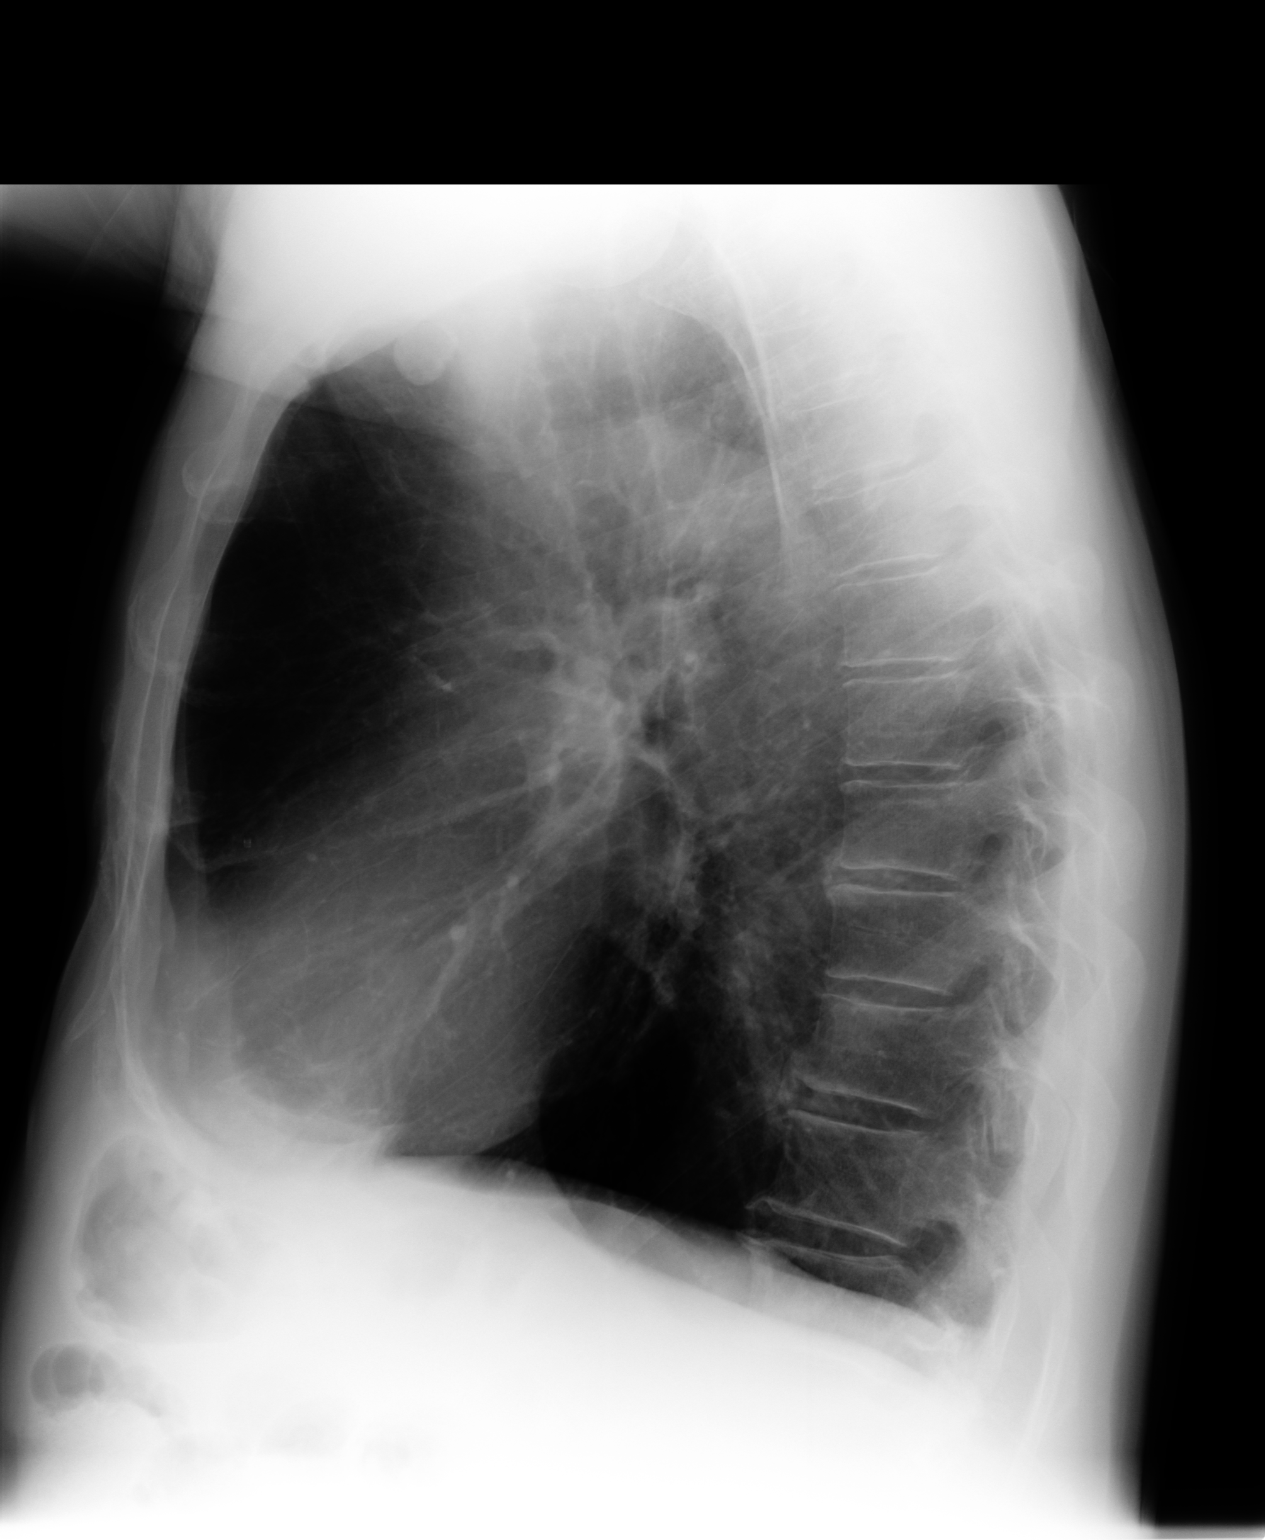
[im 3/3]
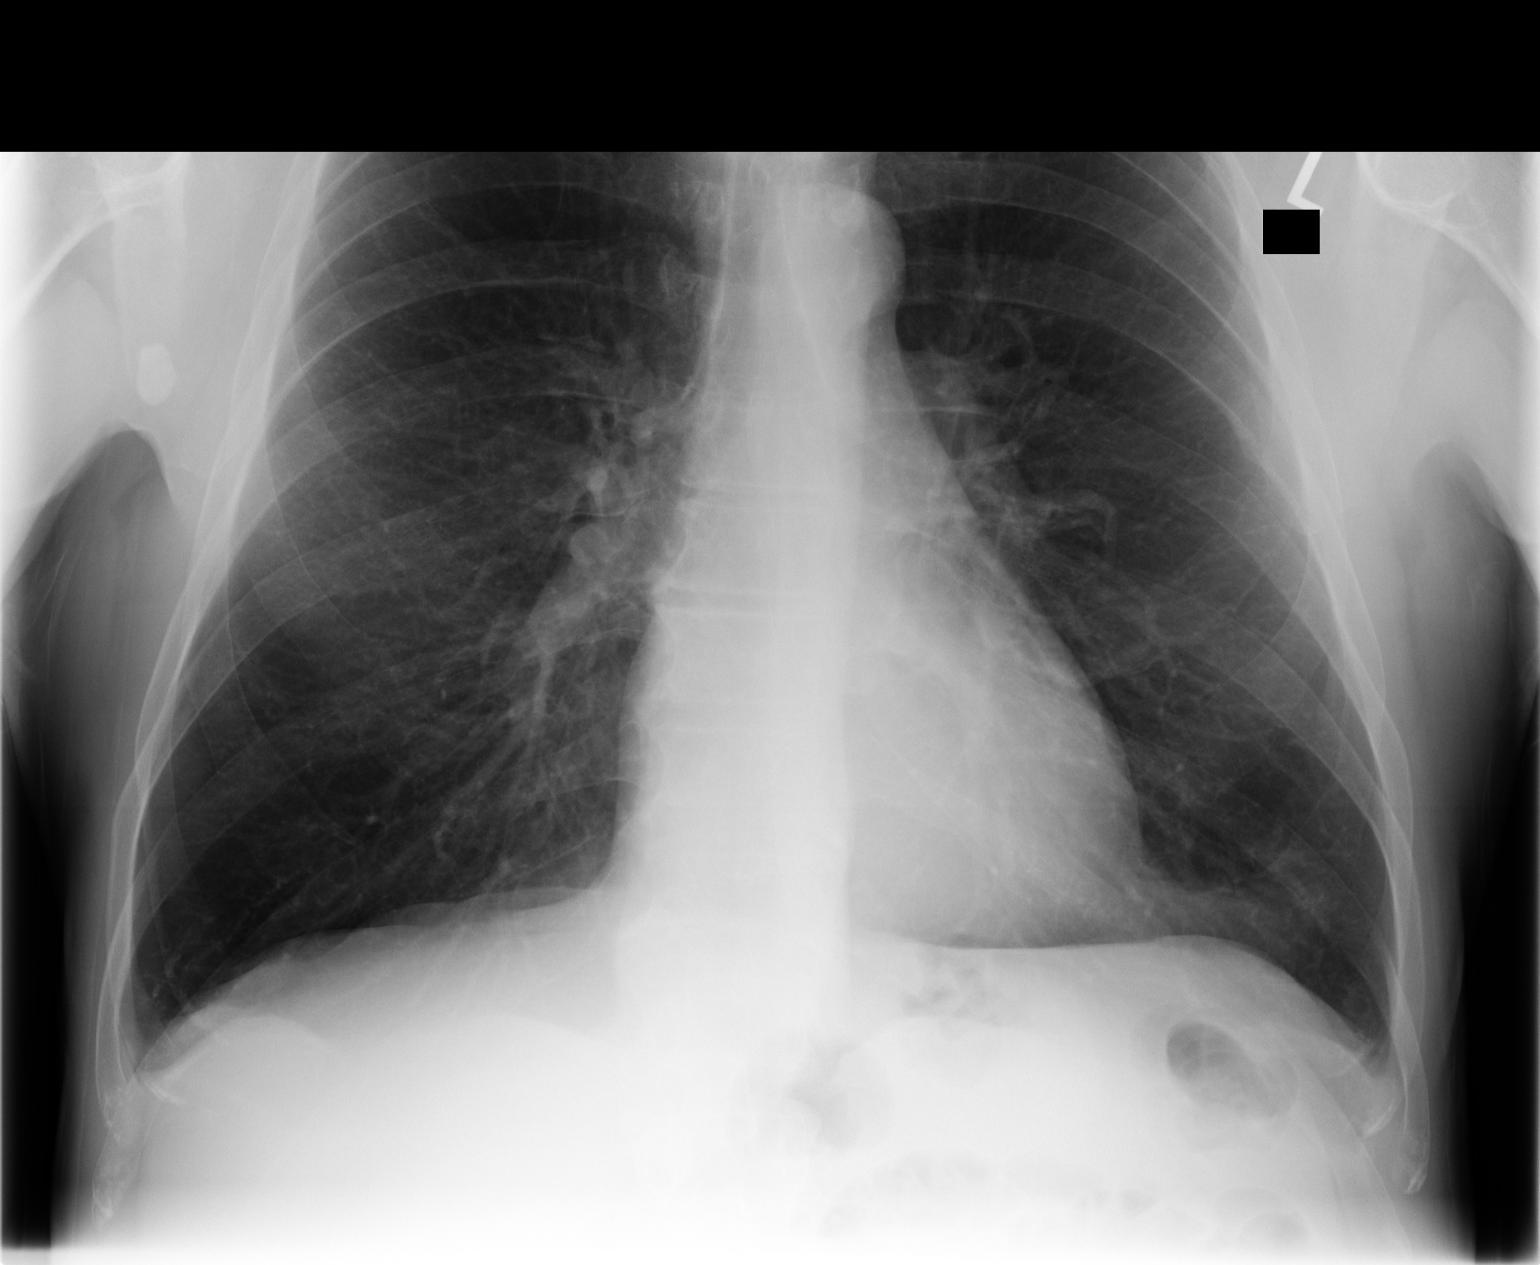

[3 of 3 positions shown; findings below may reference images not displayed]

FINDINGS: PA and lateral chest radiographs are provided. The lungs are hyperinflated
likely secondary to COPD. There is no focal parenchymal opacity, pleural
effusion, or pneumothorax. The heart and mediastinum are unremarkable. The
osseous structures are unremarkable.
IMPRESSION: No acute disease of the chest.

## 2010-10-05 ENCOUNTER — Ambulatory Visit: Payer: Self-pay | Admitting: Cardiology

## 2010-10-08 ENCOUNTER — Telehealth (INDEPENDENT_AMBULATORY_CARE_PROVIDER_SITE_OTHER): Payer: Self-pay | Admitting: *Deleted

## 2010-10-12 ENCOUNTER — Encounter: Payer: Self-pay | Admitting: Cardiology

## 2010-10-12 ENCOUNTER — Ambulatory Visit: Payer: Self-pay

## 2010-10-12 ENCOUNTER — Encounter: Payer: Self-pay | Admitting: Internal Medicine

## 2010-10-12 ENCOUNTER — Ambulatory Visit: Payer: Self-pay | Admitting: Internal Medicine

## 2010-10-12 ENCOUNTER — Encounter (HOSPITAL_COMMUNITY)
Admission: RE | Admit: 2010-10-12 | Discharge: 2010-12-19 | Payer: Self-pay | Source: Home / Self Care | Attending: Cardiology | Admitting: Cardiology

## 2010-10-15 ENCOUNTER — Encounter: Payer: Self-pay | Admitting: Cardiovascular Disease

## 2010-10-16 ENCOUNTER — Telehealth: Payer: Self-pay | Admitting: Cardiovascular Disease

## 2010-10-20 ENCOUNTER — Ambulatory Visit: Payer: Self-pay | Admitting: Cardiovascular Disease

## 2010-12-02 ENCOUNTER — Ambulatory Visit: Payer: Self-pay | Admitting: Cardiovascular Disease

## 2011-01-12 ENCOUNTER — Telehealth: Payer: Self-pay | Admitting: Cardiovascular Disease

## 2011-01-20 NOTE — Assessment & Plan Note (Signed)
Summary: BP CHECK SINCE STOPPING MEDS  Nurse Visit   Vital Signs:  Patient profile:   60 year old male Pulse rate:   68 / minute Pulse rhythm:   regular Resp:     18 per minute BP sitting:   108 / 52  (left arm) Cuff size:   large  Vitals Entered By: Sherri Rad, RN, BSN (July 01, 2010 2:19 PM)  Current Medications (verified): 1)  Aspirin 81 Mg Tbec (Aspirin) .... 2 By Mouth Once Daily 2)  Ventolin Hfa 108 (90 Base) Mcg/act Aers (Albuterol Sulfate) .... 2-3 Times Daily As Needed 3)  Advair Diskus 500-50 Mcg/dose Aepb (Fluticasone-Salmeterol) .... Use Twice Daily 4)  Lisinopril 5 Mg Tabs (Lisinopril) .... Hold 5)  Carvedilol 3.125 Mg Tabs (Carvedilol) .... Take1/2 Tablet By Mouth Twice A Day 6)  Citalopram Hydrobromide 40 Mg Tabs (Citalopram Hydrobromide) .... Take 1 Tablet By Mouth Once Daily 7)  Spiriva Handihaler 18 Mcg Caps (Tiotropium Bromide Monohydrate) .... Inhale 1 Capsule By Mouth Once Daily 8)  Lipitor 20 Mg Tabs (Atorvastatin Calcium) .... Hold 9)  Clonazepam 0.5 Mg Tabs (Clonazepam) .... Take 1/2 Tablet By Mouth Once Daily 10)  Chantix Starting Month Pak 0.5 Mg X 11 & 1 Mg X 42 Tabs (Varenicline Tartrate) .... Use As Directed 11)  Chantix Continuing Month Pak 1 Mg Tabs (Varenicline Tartrate) .... Take 1 Tablet By Mouth Once A Day 12)  Nitrostat 0.4 Mg Subl (Nitroglycerin) .[name].. 1 Tablet Under Tongue At Onset of Chest Pain; You May Repeat Every 5 Minutes For Up To 3 Doses. 13)  Pantoprazole Sodium 40 Mg Tbec (Pantoprazole Sodium) .... Take One Tab By Mouth Once Daily  Allergies: No Known Drug Allergies  Visit Type:  Nurse Visit- BP check Primary Provider:  Nilda Simmer   History of Present Illness: The pt came in the office today to f/u on his BP since his PCP decreased his carvedilol dose to 3.125mg  1/2 two times a day starting 06/25/10. He does have anxiety/ depression and does continue to smoke one to one & a half PPD. He does complain of some lightheadedness and  headaches. He states he is coming off wellbutrin due to bad side effects. He stated he started not to feel well after starting the wellbutrin. He has Chantix listed on his med list, but this is on hold until his depression is under control. We will review with Dr. Mariah Milling.

## 2011-01-20 NOTE — Assessment & Plan Note (Signed)
Summary: EC6   Visit Type:  Follow-up Primary Provider:  Nilda Simmer   History of Present Illness: 60 yo with history of smoking and COPD, coronary artery disease with a MI in December 2010, stent placed to his proximal LAD(3.0x23 mm Xience sent), and stent placed to the mid RCA ( 4.0 x 15 mm Xience stent), also with 30% left circumflex disease, ejection fraction 35% at that time with most recent ejection fraction of 45%, history of peptic ulcer and esophageal reflux.  Stress test March 2011 shows infarction in the LAD territory with some residual ischemia, ejection fraction 65%. There is apical, anteroseptal and inferior wall fixed defect with partial reversibility. Apical anteroseptal hypokinesis.  Echocardiogram March 2001 shows ejection fraction 45-50%, mild distal anterior wall hypokinesis, moderate LVH normal right ventricular systolic pressures.  He denies any further episodes of chest pain and states that overall he feels well. He is active, is a handyman and stays busy though does not do any official exercise program. He continues to smoke though is interested in trying chantix.  ECG: NSR, lateral T wave inversions  Current Medications (verified): 1)  Aspirin 81 Mg Tbec (Aspirin) .[name].. 1 By Mouth Once Daily 2)  Simvastatin 40 Mg Tabs (Simvastatin) .... Take One Tablet By Mouth Daily At Bedtime 3)  Ventolin Hfa 108 (90 Base) Mcg/act Aers (Albuterol Sulfate) .... 2-3 Times Daily As Needed 4)  Advair Diskus 500-50 Mcg/dose Aepb (Fluticasone-Salmeterol) .... Use Twice Daily 5)  Lisinopril 5 Mg Tabs (Lisinopril) .... Take One Tablet By Mouth Daily 6)  Carvedilol 3.125 Mg Tabs (Carvedilol) .... Take One Tablet By Mouth Twice A Day 7)  Citalopram Hydrobromide 40 Mg Tabs (Citalopram Hydrobromide) .... Take 1 Tablet By Mouth Once Daily 8)  Spiriva Handihaler 18 Mcg Caps (Tiotropium Bromide Monohydrate) .... Inhale 1 Capsule By Mouth Once Daily 9)  Lipitor 20 Mg Tabs (Atorvastatin Calcium)  .... Take One Tablet By Mouth Daily. 10)  Clonazepam 0.5 Mg Tabs (Clonazepam) .... Take 1/2 Tablet By Mouth Once Daily  Allergies (verified): No Known Drug Allergies  Review of Systems  The patient denies fever, weight loss, weight gain, vision loss, decreased hearing, hoarseness, chest pain, syncope, dyspnea on exertion, peripheral edema, prolonged cough, abdominal pain, incontinence, muscle weakness, depression, and enlarged lymph nodes.    Vital Signs:  Patient profile:   60 year old male Height:      70 inches Weight:      158 pounds Pulse rate:   54 / minute BP sitting:   116 / 66  (left arm) Cuff size:   large  Physical Exam  General:  well-appearing 33 middle-age gentleman in no apparent distress, HEENT exam is benign oropharynx is clear, neck is supple no JVP or carotid bruits, heart sounds are regular with S1-S2 and 2/6 systolic ejection murmur heard at the right sternal border, lungs are clear to auscultation with no wheezes Rales, abdominal exam is benign, no significant lower extremity edema, neurologic exam is grossly nonfocal, skin is warm and dry    EKG  Procedure date:  04/23/2010  Findings:      normal sinus rhythm with rate 54 beats per minute, old anterior infarct, T wave inversions V4 through V6, one and aVL  Impression & Recommendations:  Problem # 1:  CAD, NATIVE VESSEL (ICD-414.01) history of coronary artery disease, MI in December 2010 with subtotal occlusion of his proximal LAD and RCA with stents placed x2.  Followup stress testing showing no ischemia though a region of  scar in the anterior wall. Ejection fraction 45-50% on recent echocardiography.  He continues to smoke and we have counseled him on this for 10 minutes. He will try changing to experience he will also consider trying electronic cigarettes. He states that the nicotine patches did not work as he smokes with the patches on.  We'll continue him on aspirin and cholesterol  medication.  The following medications were removed from the medication list:    Plavix 75 Mg Tabs (Clopidogrel bisulfate) .[name]... 1 tab by mouth daily His updated medication list for this problem includes:    Aspirin 81 Mg Tbec (Aspirin) .[name]... 1 by mouth once daily    Lisinopril 5 Mg Tabs (Lisinopril) .[name]... Take one tablet by mouth daily    Carvedilol 3.125 Mg Tabs (Carvedilol) .[name]... Take one tablet by mouth twice a day    Nitrostat 0.4 Mg Subl (Nitroglycerin) .[name]... 1 tablet under tongue at onset of chest pain; you may repeat every 5 minutes for up to 3 doses.  Problem # 2:  HYPERLIPIDEMIA (ICD-272.4) he'll continue on his Lipitor 10 mg daily Dr. Oswaldo Done will be checking his cholesterol later in the summer. I suspect that he would need Lipitor 20 mg daily the we will evaluate his cholesterol when it is available.  Problem # 3:  COPD (ICD-496) his COPD is handled by Dr. Katrinka Blazing. We have counseled him on smoking cessation. His updated medication list for this problem includes:    Ventolin Hfa 108 (90 Base) Mcg/act Aers (Albuterol sulfate) .[name]... 2-3 times daily as needed    Advair Diskus 500-50 Mcg/dose Aepb (Fluticasone-salmeterol) ..... Use twice daily    Spiriva Handihaler 18 Mcg Caps (Tiotropium bromide monohydrate) ..... Inhale 1 capsule by mouth once daily  Problem # 4:  CARDIOMYOPATHY (ICD-425.4) ejection fraction has improved from 35% to 45-50%. We have suggested he continue on his current medication regimen.  The following medications were removed from the medication list:    Plavix 75 Mg Tabs (Clopidogrel bisulfate) .[name]... 1 tab by mouth daily His updated medication list for this problem includes:    Aspirin 81 Mg Tbec (Aspirin) .[name]... 1 by mouth once daily    Lisinopril 5 Mg Tabs (Lisinopril) .[name]... Take one tablet by mouth daily    Carvedilol 3.125 Mg Tabs (Carvedilol) .[name]... Take one tablet by mouth twice a day    Nitrostat 0.4 Mg Subl (Nitroglycerin) .[name]... 1 tablet under tongue  at onset of chest pain; you may repeat every 5 minutes for up to 3 doses.  Patient Instructions: 1)  Your physician recommends that you schedule a follow-up appointment in: 6 months 2)  Your physician has recommended you make the following change in your medication:Start taking Nitroglycerin as needed for chest pain.  3)  Your physician discussed the hazards of tobacco use.  Tobacco use cessation is recommended and techniques and options to help you quit were discussed. Start taking Chantix and stop smoking. Prescriptions: NITROSTAT 0.4 MG SUBL (NITROGLYCERIN) 1 tablet under tongue at onset of chest pain; you may repeat every 5 minutes for up to 3 doses.  #25 x 11   Entered by:   Cloyde Reams RN   Authorized by:   Dossie Arbour MD   Signed by:   Cloyde Reams RN on 04/23/2010   Method used:   Electronically to        [facility] (retail)       [address]  [place], [place]  [address]       Ph: [phone]       Fax: [phone]   RxID:   [id number] CHANTIX CONTINUING MONTH PAK 1 MG TABS (VARENICLINE TARTRATE) Take 1 tablet by mouth once a day  #30 x 1   Entered by:   Cloyde Reams RN   Authorized by:   Dossie Arbour MD   Signed by:   Cloyde Reams RN on 04/23/2010   Method used:   Electronically to        [facility] (retail)       [address]       [place], [place]  [address]       Ph: [phone]       Fax: [phone]   RxID:   [id number] CHANTIX STARTING MONTH PAK 0.5 MG X 11 & 1 MG X 42 TABS (VARENICLINE TARTRATE) use as directed  #1 x 0   Entered by:   Cloyde Reams RN   Authorized by:   Dossie Arbour MD   Signed by:   Cloyde Reams RN on 04/23/2010   Method used:   Electronically to        [facility] (retail)       [address]       [place], [place]  [address]       Ph: [phone]       Fax: [phone]   RxID:   [id number]

## 2011-01-20 NOTE — Assessment & Plan Note (Signed)
Summary: ROV   Visit Type:  Initial Consult Primary Provider:  Nilda Simmer  CC:  c/o chest pain (tightness) and shortness of breath..  History of Present Illness: 60 yo with history of CAD s/p MI in 12/10 with DES to proximal LAD and mid RCA as well as mild ischemic CMP and COPD presents for cardiology followup.  Patient reports tightness in his mid chest that has been present continuously for 2 weeks.  It is fairly mild but has been nagging.  It is not worse with exertion.  He has been wheezing occasionally but not continuously and has been using his inhalers.  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  As mentioned, pain is no worse with heavy exertion.  He is mildly short of breath walking up steps or up a hill, which he attributes to COPD.  His joint pain has lessened now that he is taking Crestor every other day.  ECG: NSR, old anterior MI  Current Medications (verified): 1)  Aspirin 81 Mg Tbec (Aspirin) .... 2 By Mouth Once Daily 2)  Ventolin Hfa 108 (90 Base) Mcg/act Aers (Albuterol Sulfate) .... 2-3 Times Daily As Needed 3)  Advair Diskus 500-50 Mcg/dose Aepb (Fluticasone-Salmeterol) .... Use Twice Daily 4)  Carvedilol 3.125 Mg Tabs (Carvedilol) .... Take1/2 Tablet By Mouth Twice A Day 5)  Citalopram Hydrobromide 40 Mg Tabs (Citalopram Hydrobromide) .... Take 1 Tablet By Mouth Once Daily 6)  Spiriva Handihaler 18 Mcg Caps (Tiotropium Bromide Monohydrate) .... Inhale 1 Capsule By Mouth Once Daily 7)  Clonazepam 0.5 Mg Tabs (Clonazepam) .... Take 1/2 Tablet By Mouth Once Daily 8)  Nitrostat 0.4 Mg Subl (Nitroglycerin) .[name].. 1 Tablet Under Tongue At Onset of Chest Pain; You May Repeat Every 5 Minutes For Up To 3 Doses. 9)  Effient 10 Mg Tabs (Prasugrel Hcl) .... Take One Tablet By Mouth Daily 10)  Crestor 10 Mg Tabs (Rosuvastatin Calcium) .... Take 1/2 Tablet Every Other Day 11)  Nexium 40 Mg Cpdr (Esomeprazole Magnesium)  .... One Tablet Once Daily 12)  Imdur 30 Mg Xr24h-Tab (Isosorbide Mononitrate) .... One Tablet Once Daily  Allergies (verified): No Known Drug Allergies  Past History:  Past Surgical History: Last updated: 11/20/2009 1) Penile implant 2002 by [facility]on 2) EGD 2008 by [facility] 3) Colonoscopy 2008; colonic polyps 4) Esophageal dilation 2000 by [facility] 5) ETT 2006  Family History: Last updated: 05-21-10 Father: deceased @ 58. CAD (1st MI in late 26s to early 56s), CHF, diabetes, hypertension, hypercholesterolemia Mother: deceased @ 71. 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  Myoview (3/11) with apical, apical anteroseptal, and apical inferior fixed defect with mild reversibility suggesting LAD territory infarct with mild peri-infarct ischemia (done at outside site).   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  Father: deceased @ 11. CAD (1st MI in late 23s to early 52s), CHF, diabetes, hypertension, hypercholesterolemia Mother: deceased @ 61. COPD Brother with MI in his early 50s FH: Heart Disease  Social History: Reviewed history from 07/30/2010 and no changes required. 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   Vital Signs:  Patient profile:   60 year old male Height:      70 inches Weight:      158 pounds BMI:     22.75 Pulse rate:   74 / minute BP sitting:   126 / 71  (left arm) Cuff size:   regular  Vitals Entered By: Bishop Dublin, CMA (October 05, 2010 11:38 AM)  Physical Exam  General:  Well developed, well nourished, in no acute distress. Neck:  Neck supple, no JVD. No masses, thyromegaly or abnormal cervical nodes. Lungs:  Decreased breath sounds bilaterally.  Heart:  Non-displaced PMI, chest non-tender; regular rate and rhythm, S1, S2 without murmurs, rubs or gallops. Carotid upstroke normal, no bruit. Pedals normal pulses. No edema, no varicosities. Abdomen:  Bowel sounds positive; abdomen soft and non-tender without masses, organomegaly, or hernias noted. No hepatosplenomegaly. Extremities:  No clubbing or cyanosis. Neurologic:  Alert and oriented x 3. Psych:  Normal affect.   Impression & Recommendations:  Problem # 1:  CAD, NATIVE VESSEL (ICD-414.01) Atypical chest pain, has been fairly continuous.  However, he does continue to smoke and had extensive CAD (s/p DES to LAD and RCA in 12/10).  I will draw a troponin today to make sure that there has been no myocardial damage given the ongoing pain (x 2 wks).  If that is ok, will get ETT-myoview.  Continue cardiac meds except will change Coreg to bisoprolol 2.5 mg daily given COPD.  Will followup in 2 wks.   Problem # 2:  ISCHEMIC CMP Euvolemic.  Changing beta blocker to bisoprolol.  At next appointment, should add ACEI.   Problem # 3:  SMOKER (ICD-305.1) Advised to quit.   Other Orders: T-CK Total [phone]) T-CK MB Fract ([id number]) T-Troponin I ([id number]) Nuclear Stress Test (Nuc Stress Test)  Patient Instructions: 1)   Your physician recommends that you schedule a follow-up appointment in: 2 weeks with Dr. Mariah Milling 2)  Your physician has recommended you make the following change in your medication: Stop Coreg.  Start Bisoprolol 2.5 mg one tablet everyday and CoQ10 100 mg one tablet everyday. 3)  Your physician has requested that you have an exercise stress myoview.  For further information please visit [email or web address]  Please follow instruction sheet, as given. Prescriptions: BISOPROLOL FUMARATE 5 MG TABS (BISOPROLOL FUMARATE) take 1/2 tablet once daily  #15 x 3   Entered by:   Bishop Dublin, CMA   Authorized by:   Marca Ancona, MD   Signed by:   Bishop Dublin, CMA on 10/05/2010   Method used:   Electronically to        [facility] (retail)       [address]       [place], [place]  [address]       Ph: [phone]       Fax: [phone]  RxID:   [id number]

## 2011-01-20 NOTE — Progress Notes (Signed)
Summary: Muscle Ache  Phone Note Call from Patient   Caller: Patient Call For: Gollan  Summary of Call: Pt came in to office wanting to talk about medications. C/O muscle pains, is taking crestor 10mg  taking 1/2 tab daily. Please advise.  Initial call taken by: Benedict Needy, RN,  October 01, 2010 11:10 AM  Follow-up for Phone Call        pt seen in office by Dr. Shirlee Latch.  Follow-up by: Benedict Needy, RN,  October 06, 2010 3:09 PM

## 2011-01-20 NOTE — Letter (Signed)
Summary: [place] [employer] [facility], agricultural at [facility] [address]   [place], [place] [address]   Phone: [phone]  Fax: [phone]      October 12, 2010 MRN: [id number]   [facility] [address] [place], [place]  [address]   Dear Mr. Pandit,  Your test ordered by Selena Batten has been reviewed by your physician (or physician assistant) and was found to be normal or stable. Your physician (or physician assistant) felt no changes were needed at this time.  ____ Echocardiogram  ____ Cardiac Stress Test  __x__ Lab Work  ____ Peripheral vascular study of arms, legs or neck  ____ CT scan or X-ray  ____ Lung or Breathing test  ____ Other:   Thank you.   Benedict Needy, RN    Marca Ancona, MD

## 2011-01-20 NOTE — Letter (Signed)
Summary: [place] [employer] [facility], agricultural at [facility] [address]   [place], [place] [address]   Phone: [phone]  Fax: [phone]      October 15, 2010 MRN: [id number]   [facility] [address] [place], [place]  [address]   Dear Mr. Stage,  Your test ordered by Selena Batten has been reviewed by your physician (or physician assistant) and was found to be normal or stable. Your physician (or physician assistant) felt no changes were needed at this time.  ____ Echocardiogram  __x__ Cardiac Stress Test  ____ Lab Work  ____ Peripheral vascular study of arms, legs or neck  ____ CT scan or X-ray  ____ Lung or Breathing test  ____ Other:   Thank you.   Benedict Needy, RN    Dossie Arbour, MD

## 2011-01-20 NOTE — Assessment & Plan Note (Signed)
Summary: F2W/AMD   Visit Type:  Follow-up Primary Provider:  Nilda Simmer  CC:  c/o shortness of breath..  History of Present Illness: 60 yo with history of CAD s/p MI in 12/10 with DES to proximal LAD and mid RCA as well as mild ischemic CM and COPD presents for cardiology followup. he has a long smoking hx.   overall he has been doing well. He continues to smoke one pack or more a day and has chronic shortness of breath with exertion. He rides horses for fun, moves Hay, push mowes with no significant symptoms of chest discomfort. He has been tolerating his Crestor with no symptoms.    his stress test showed regions of scar that was consistent with previous stress test. No significant ischemia noted. Medical management was recommended.  Old ECG: NSR, old anterior MI  Current Medications (verified): 1)  Aspirin 81 Mg Tbec (Aspirin) .... 2 By Mouth Once Daily 2)  Ventolin Hfa 108 (90 Base) Mcg/act Aers (Albuterol Sulfate) .... 2-3 Times Daily As Needed 3)  Advair Diskus 500-50 Mcg/dose Aepb (Fluticasone-Salmeterol) .... Use Twice Daily 4)  Citalopram Hydrobromide 40 Mg Tabs (Citalopram Hydrobromide) .... Take 1 Tablet By Mouth Once Daily 5)  Spiriva Handihaler 18 Mcg Caps (Tiotropium Bromide Monohydrate) .... Inhale 1 Capsule By Mouth Once Daily 6)  Clonazepam 0.5 Mg Tabs (Clonazepam) .... Take 1/2 Tablet By Mouth Once Daily 7)  Nitrostat 0.4 Mg Subl (Nitroglycerin) .[name].. 1 Tablet Under Tongue At Onset of Chest Pain; You May Repeat Every 5 Minutes For Up To 3 Doses. 8)  Effient 10 Mg Tabs (Prasugrel Hcl) .... Take One Tablet By Mouth Daily 9)  Crestor 10 Mg Tabs (Rosuvastatin Calcium) .... Take 1/2 Tablet Every Other Day 10)  Nexium 40 Mg Cpdr (Esomeprazole Magnesium) .... One Tablet Once Daily 11)  Imdur 30 Mg Xr24h-Tab (Isosorbide Mononitrate) .... One Tablet Once Daily 12)  Bisoprolol Fumarate 5 Mg Tabs (Bisoprolol Fumarate) .... Take 1/2 Tablet Once Daily  Allergies (verified): No  Known Drug Allergies  Past History:  Past Medical History: Last updated: 10/05/2010 >Hyperlipidemia >COPD: prior smoker of 2 ppd, now down to < 1/2 ppd >Depression >GERD >Colon polyps >Esophageal dilatation in 2000 >CAD: MI 12/10 with DES to proximal LAD and mid RCA.  Myoview (3/11) with apical, apical anteroseptal, and apical inferior fixed defect with mild reversibility suggesting LAD territory infarct with mild peri-infarct ischemia (done at outside site).   >Ischemic CMP: EF 45% >Nicotine dependence >Peptic Ulcer  Past Surgical History: Last updated: 11/20/2009 1) Penile implant 2002 by [facility] 2) EGD 2008 by [facility] 3) Colonoscopy 2008; colonic polyps 4) Esophageal dilation 2000 by [facility] 5) ETT 2006  Family History: Last updated: May 07, 2010 Father: deceased @ 6. CAD (1st MI in late 72s to early 62s), CHF, diabetes, hypertension, hypercholesterolemia Mother: deceased @ 59. 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  The patient denies fever, weight loss, weight gain, vision loss, decreased hearing, hoarseness, chest pain, syncope, peripheral edema, prolonged cough, abdominal pain, incontinence, muscle  weakness, depression, and enlarged lymph nodes.    Vital Signs:  Patient profile:   60 year old male Height:      70 inches Weight:      160 pounds BMI:     23.04 Pulse rate:   52 / minute BP sitting:   128 / 76  (left arm) Cuff size:   regular  Vitals Entered By: Bishop Dublin,  CMA (October 20, 2010 11:20 AM)  Physical Exam  General:  Well developed, well nourished, in no acute distress. Head:  normocephalic and atraumatic Neck:  Neck supple, no JVD. No masses, thyromegaly or abnormal cervical nodes. Lungs:  decreased breath sounds throughout bilaterally Heart:  Non-displaced PMI, chest non-tender; regular rate and rhythm, S1, S2 without murmurs, rubs or gallops. Carotid upstroke normal, no bruit. Pedals normal pulses. No edema, no varicosities. Abdomen:  Bowel sounds positive; abdomen soft and non-tender without masses Msk:  Back normal, normal gait. Muscle strength and tone normal. Extremities:  No clubbing or cyanosis. Neurologic:  Alert and oriented x 3. Psych:  Normal affect.   Impression & Recommendations:  Problem # 1:  CAD, NATIVE VESSEL (ICD-414.01) severe coronary artery disease with 2 stents placed to his LAD and RCA in December 2010. He reports no significant anginal symptoms. We'll continue medical management at this time.  His updated medication list for this problem includes:    Aspirin 81 Mg Tbec (Aspirin) .[name]... 2 by mouth once daily    Nitrostat 0.4 Mg Subl (Nitroglycerin) .[name]... 1 tablet under tongue at onset of chest pain; you may repeat every 5 minutes for up to 3 doses.    Effient 10 Mg Tabs (Prasugrel hcl) .[name]... Take one tablet by mouth daily    Imdur 30 Mg Xr24h-tab (Isosorbide mononitrate) ..... One tablet once daily    Bisoprolol Fumarate 5 Mg Tabs (Bisoprolol fumarate) .[name]... Take 1/2 tablet once daily  Problem # 2:  SMOKER (ICD-305.1) We have encouraged him to continue to pursue smoking cessation plans. He is tried various things so nothing seems to work and he continues to smoke more than one pack per day. This is very important in the overall picture for his cardiovascular health.  Problem # 3:  HYPERLIPIDEMIA (ICD-272.4) We'll continue Crestor 5 mg daily. Last cholesterol check in January of this year was at goal.  His  updated medication list for this problem includes:    Crestor 10 Mg Tabs (Rosuvastatin calcium) .[name]... Take 1/2 tablet once daily  Problem # 4:  COPD (ICD-496) He does have severe COPD likely secondary to long smoking history. Improved on inhalers.  His updated medication list for this problem includes:    Ventolin Hfa 108 (90 Base) Mcg/act Aers (Albuterol sulfate) .[name]... 2-3 times daily as needed    Advair Diskus 500-50 Mcg/dose Aepb (Fluticasone-salmeterol) ..... Use twice daily    Spiriva Handihaler 18 Mcg Caps (Tiotropium bromide monohydrate) ..... Inhale 1 capsule by mouth once daily  Problem # 5:  DEPRESSION/ANXIETY (ICD-300.4) He does not want to try chantx because of a history of anxiety and depression.  Patient Instructions: 1)  Your physician recommends that you schedule a follow-up appointment in: 6 months. 2)  Your physician has recommended you make the following change in your medication: Take Crestor 10 mg 1/2 tablet everyday.

## 2011-01-20 NOTE — Progress Notes (Signed)
Summary: CLEARANCE  Phone Note Call from Patient Call back at Home Phone [phone]   Caller: SELF Call For: [facility] Summary of Call: NEEDS CLEARANCE FOR A COLONOSCOPY WITH DR Wynelle Link [facility] AT [facility]-#[phone] #[phone] Initial call taken by: Harlon Flor,  June 01, 2010 2:50 PM  Follow-up for Phone Call        Is pt okay for colonoscopy or do they need OV first.  Thanks Benedict Needy, RN  June 01, 2010 4:06 PM     Additional Follow-up for Phone Call Additional follow up Details #2::    Can not do routine colo for at least one year after stent placement 11/2009 Can not stop plavix/asa for colonoscopy. He has drug eluting stent   Appended Document: CLEARANCE faxed note to Dr Marlan Palau office

## 2011-01-20 NOTE — Assessment & Plan Note (Signed)
Summary: ROV/AMD   Primary Provider:  Nilda Simmer  CC:  Incr SOB x 2 days, c/o chest discomfort, "Sunday c/o fatigue, and onset of dizziness/lightheaded x 2 days..  History of Present Illness: 60 yo with history of smoking and COPD, coronary artery disease with a MI in December 2010, stent placed to his proximal LAD(3.0x23 mm Xience sent), and stent placed to the mid RCA ( 4.0 x 15 mm Xience stent), also with 30% left circumflex disease, ejection fraction 35% at that time with most recent ejection fraction of 45%, history of peptic ulcer and esophageal reflux.  Leslie Sims states that he started developing some malaise on Saturday and Sunday when he was working. He had a little bit of chest discomfort that developed yesterday on Monday. He's continued to have a little bit of chest burning and discomfort today. He is uncertain of this feels like his typical anginal equivalent or whether it is anxiety or whether it is heartburn.  He has had a cough since December and wonders if it might be due to one of the medications for blood pressure. He also wonders whether he's recent muscle ache over the past several days with malaise might be due to the Lipitor. He has not been taking any nitroglycerin.  he is very anxious about the upcoming week as he is supposed to be at his twin daughters birthday party this Friday we supposed to leave to drive for that day for his son's wedding on Saturday.  Stress test March 2011 shows infarction in the LAD territory with some residual ischemia, ejection fraction 65%. There is apical, anteroseptal and inferior wall fixed defect with partial reversibility. Apical anteroseptal hypokinesis.  Echocardiogram March 2001 shows ejection fraction 45-50%, mild distal anterior wall hypokinesis, moderate LVH normal right ventricular systolic pressures.    ECG: NSR, lateral T wave inversions  Allergies: No Known Drug Allergies  Review of Systems       The patient complains  of chest pain.  The patient denies fatigue, malaise, fever, weight gain/loss, vision loss, decreased hearing, hoarseness, palpitations, shortness of breath, prolonged cough, wheezing, sleep apnea, coughing up blood, abdominal pain, blood in stool, nausea, vomiting, diarrhea, heartburn, incontinence, blood in urine, muscle weakness, joint pain, leg swelling, rash, skin lesions, headache, fainting, dizziness, depression, anxiety, enlarged lymph nodes, easy bruising or bleeding, and environmental allergies.         Malaise, chest burning  Vital Signs:  Patient profile:   60 year old male Height:      70 inches Weight:      15" 8 pounds BP sitting:   138 / 60  (left arm) Cuff size:   large  Vitals Entered By: Cloyde Reams RN (Apr 28, 2010 11:57 AM)  Physical Exam  General:  thin tan gentleman in no apparent distress, HEENT exam is benign, oropharynx is clear, neck is supple with no JVP or carotid bruits, heart sounds are regular with normal S1-S2 and no murmurs appreciated, lungs are clear to auscultation with no wheezes or rales, abdominal exam is benign, no significant lower extremity edema, neurologic exam is nonfocal, skin is warm and dry. Pulses are equal and symmetrical in his upper and lower extremities.   New Orders:     1)  T-CK Total [phone])     2)  T-CK MB Fract ([id number])     3)  T-Troponin I ([id number])   EKG  Procedure date:  04/28/2010  Findings:      normal sinus rhythm with rate  of 54 beats per minute, old anterior infarct, T-wave abnormality noted in one and aVL. This was noted on previous EKG from December 2010.  Impression & Recommendations:  Problem # 1:  CHEST PAIN UNSPECIFIED (ICD-786.50) he has had malaise over the past several days with chest discomfort and burning in the past day or 2. It is difficult to tell if this is G2 anxiety, GERD or coronary ischemia. It does seem somewhat atypical though we have encouraged him to take nitroglycerin sublingual  if his pain does not improve. We'll check a set of cardiac enzymes today. I've asked him to double up on his proton pump inhibitor. For now I will hold the lisinopril to to his history of cough since December. I will also hold the Lipitor as he comments on having some malaise and muscle aches. My suspicion is this may be due to the stress from his upcoming family events this Friday and Saturday.  I've asked him to contact me if his symptoms get worse and we will evaluate him in the emergency room.  His updated medication list for this problem includes:    Aspirin 81 Mg Tbec (Aspirin) .[name]... 2 by mouth once daily    Lisinopril 5 Mg Tabs (Lisinopril) ..... Hold    Carvedilol 3.125 Mg Tabs (Carvedilol) .[name]... Take one tablet by mouth twice a day    Nitrostat 0.4 Mg Subl (Nitroglycerin) .[name]... 1 tablet under tongue at onset of chest pain; you may repeat every 5 minutes for up to 3 doses.  Orders: T-CK Total [id number]) T-CK MB Fract ([id number]) T-Troponin I ([id number])  Patient Instructions: 1)  Your physician has recommended you make the following change in your medication: Hold your Lisinopril and Lipitor.

## 2011-01-20 NOTE — Assessment & Plan Note (Signed)
Summary: Cardiology Nuclear Testing  Nuclear Med Background Indications for Stress Test: Evaluation for Ischemia, Stent Patency   History: COPD, Echo, Emphysema, GXT, Heart Catheterization, Myocardial Infarction, Myocardial Perfusion Study, Stents  History Comments: 3/11 EAV:[id number] scar with mild P.I. ischemia,  EF=65%   Symptoms: Chest Tightness, Dizziness, DOE, Fatigue, Light-Headedness, SOB  Symptoms Comments: Last episode of CP:"constant", this a.m., 1/10.   Nuclear Pre-Procedure Cardiac Risk Factors: Family History - CAD, Hypertension, Lipids, Smoker Caffeine/Decaff Intake: None NPO After: 7:30 PM Lungs: Clear. IV 0.9% NS with Angio Cath: 22g     IV Site: R Antecubital IV Started by: Bonnita Levan, RN Chest Size (in) 40     Height (in): 70 Weight (lb): 158 BMI: 22.75 Tech Comments: Bisoprolol held x 24 hours.  Nuclear Med Study 1 or 2 day study:  1 day     Stress Test Type:  Treadmill/Lexiscan Reading MD:  Dietrich Pates, MD     Referring MD:  Marca Ancona, MD Resting Radionuclide:  Technetium 71m Tetrofosmin     Resting Radionuclide Dose:  11 mCi  Stress Radionuclide:  Technetium 53m Tetrofosmin     Stress Radionuclide Dose:  33 mCi   Stress Protocol Exercise Time (min):  9:03 min     Max HR:  118 bpm     Predicted Max HR:  164 bpm       Percent Max HR:  71.95 %        Nuclear Technologist:  Harlow Asa, CNMT  Rest Procedure  Myocardial perfusion imaging was performed at rest 45 minutes following the intravenous administration of Technetium 98m Tetrofosmin.  Stress Procedure  The patient initially walked the treadmill utilizing the Bruce protocol for 8:00, but was unable to obtain his desired heart rate.  He was then given IV Lexiscan 0.4 mg over 15-seconds while continuing to walk on the treadmill and then Technetium 78m Tetrofosmin was injected at 30-seconds.  There were ST-T wave changes prior to lexiscan and he c/o chest tightness prior to exercise, 1/10.   This did not increase with exercise or infusion.  Quantitative spect images were obtained after a 45 minute delay.  QPS Raw Data Images:  Soft tissue (diaphragm, bowel activity) underlies heart. Stress Images:  Defect in the anterolateral wall (mid, distal), anteror wall (mid, distal) and apex.   Rest Images:  No significatn change from the stress images. Transient Ischemic Dilatation:  1.02  (Normal <1.22)  Lung/Heart Ratio:  .30  (Normal <0.45)  Quantitative Gated Spect Images QGS EDV:  153 ml QGS ESV:  73 ml QGS EF:  52 % QGS cine images:  Hypokiinesis and akinesis in the distal anterior and apical walls.   Overall Impression  Exercise Capacity: Good exercise capacity.  Heart rate did not increase so lexiscan given at 8 minutes. BP Response: Normal blood pressure response. Clinical Symptoms: No chest pain ECG Impression: Less than 1 mm ST depression in the inferior leads at in the recovery period.   Overall Impression Comments: Scar in the mid/distal anterior, anterolateral and apical walls.  There does not appear to be significant ischemia.  LVEF calculated at 52%.      Appended Document: Cardiology Nuclear Testing Report called from [facility] in [facility] department.  Reported that images were different from the last stress done.  Appended Document: Cardiology Nuclear Testing Would continue medical management  Appended Document: Cardiology Nuclear Testing letter mailed to pt

## 2011-01-20 NOTE — Progress Notes (Signed)
Summary: MEDICATION AND BLEEDING  Phone Note Call from Patient Call back at Home Phone [phone]   Caller: SELF Call For: [facility] Summary of Call: PT IS BACK TO 1/2 TABLET OF CRESTOR BECAUSE OF SORENESS-PT IS ALSO TAKING EFFIENT AND IS HAVING HEAVY BLEEDING, ESPECIALLY FROM HIS HEMORRHOIDS Initial call taken by: Harlon Flor,  August 28, 2010 10:49 AM  Follow-up for Phone Call        [facility] pt had stent 11/2009. Benedict Needy, RN  August 28, 2010 12:15 PM   [facility] TCB Benedict Needy, RN  August 28, 2010 2:17 PM   Spoke to pt he c/o bleeding from his hemorhoids. Went to [facility] and had labs drawn. Called [facility] to comfirm that he did have labs: CBC,CMP,LIPIDS and PLATLETS. Benedict Needy, RN  August 28, 2010 4:11 PM   Additional Follow-up for Phone Call Additional follow up Details #1::        Per Dr. Mariah Milling pt may stop ASA take stool softner. Pt will cut back to 1 asa 81mg  daily and take stool softner. Will call [facility] on Monday to get labs. Benedict Needy, RN  August 28, 2010 5:15 PM   Called [facility] and requested labwork be faxed to Dr Mariah Milling.  Additional Follow-up by: Cloyde Reams RN,  September 02, 2010 3:58 PM    Additional Follow-up for Phone Call Additional follow up Details #2::    [facility] Benedict Needy, RN  September 04, 2010 11:40 AM   Pt called back. Bleeding improved since d/c aspirin.  Pt got a flu shot last week is c/o fatigue and aching. He doesn't know if the aching is the crestor or not. Will stop the crestor for a few days and call back. Benedict Needy, RN  September 07, 2010 9:20 AM

## 2011-01-20 NOTE — Assessment & Plan Note (Signed)
Summary: F3M/AMD   Visit Type:  Follow-up Primary Provider:  Nilda Simmer  CC:  c/o fatigue and some shortness of breath.  Has some depression and just had a death in family.Leslie Sims  History of Present Illness: 60 yo with Long history of smoking and COPD continues to smoke 1-1/2 packs per day, coronary artery disease with a MI in December 2010, stent placed to his proximal LAD(3.0x23 mm Xience sent), and stent placed to the mid RCA ( 4.0 x 15 mm Xience stent), also with 30% left circumflex disease, ejection fraction 35% at that time with most recent ejection fraction of 45%, history of peptic ulcer and esophageal reflux who presents for routine followup.  he denies any recent episodes of chest pain. He has been working on his diabetes and reports that his hemoglobin A1c has dropped from greater than 7-6.4. He states his brother recently passed away from cardiac related disease. He's been very anxious and sad because of this. We held his Lipitor on his last visit secondary to myalgias though he is uncertain if this helped to a significant degree and is willing to retry a statin given what happened to his brother.  Also he is not taking Plavix for uncertain reasons. He has a drug-eluting stent placed in 11/2009.  Stress test March 2011 shows infarction in the LAD territory with some residual ischemia, ejection fraction 65%. There is apical, anteroseptal and inferior wall fixed defect with partial reversibility. Apical anteroseptal hypokinesis.  Echocardiogram March 2001 shows ejection fraction 45-50%, mild distal anterior wall hypokinesis, moderate LVH normal right ventricular systolic pressures.  old ECG: NSR, lateral T wave inversions   Current Medications (verified): 1)  Aspirin 81 Mg Tbec (Aspirin) .... 2 By Mouth Once Daily 2)  Ventolin Hfa 108 (90 Base) Mcg/act Aers (Albuterol Sulfate) .... 2-3 Times Daily As Needed 3)  Advair Diskus 500-50 Mcg/dose Aepb (Fluticasone-Salmeterol) .... Use  Twice Daily 4)  Carvedilol 3.125 Mg Tabs (Carvedilol) .... Take1/2 Tablet By Mouth Twice A Day 5)  Citalopram Hydrobromide 40 Mg Tabs (Citalopram Hydrobromide) .... Take 1 Tablet By Mouth Once Daily 6)  Spiriva Handihaler 18 Mcg Caps (Tiotropium Bromide Monohydrate) .... Inhale 1 Capsule By Mouth Once Daily 7)  Clonazepam 0.5 Mg Tabs (Clonazepam) .... Take 1/2 Tablet By Mouth Once Daily 8)  Nitrostat 0.4 Mg Subl (Nitroglycerin) .Leslie Sims.. 1 Tablet Under Tongue At Onset of Chest Pain; You May Repeat Every 5 Minutes For Up To 3 Doses. 9)  Pantoprazole Sodium 40 Mg Tbec (Pantoprazole Sodium) .... Take One Tab By Mouth Once Daily  Allergies (verified): No Known Drug Allergies  Past History:  Past Medical History: Last updated: 05/04/2010 >Hyperlipidemia >COPD: prior smoker of 2 ppd, now down to < 1/2 ppd >Depression >GERD >Colon polyps >Esophageal dilatation in 2000 >Prior chest pain: ETT-myoview negative in 2006 per patient, prior to that had left heart catheterization at [facility] 16 years ago that was reportedly normal.  >Cardiomyopathy >Nicotine dependence >Peptic Ulcer  Past Surgical History: Last updated: 11/20/2009 1) Penile implant 2002 by Sheppard Penton 2) EGD 2008 by [facility] 3) Colonoscopy 2008; colonic polyps 4) Esophageal dilation 2000 by [facility] 5) ETT 2006  Family History: Last updated: 05/04/10 Father: deceased @ 31. CAD (1st MI in late 106s to early 60s), CHF, diabetes, hypertension, hypercholesterolemia Mother: deceased @ 33. 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         anxious  Vital Signs:  Patient profile:   60 year old male Height:      70 inches Weight:      157 pounds BMI:     22.61 Pulse rate:   73 / minute BP sitting:   123 / 81  (left arm) Cuff size:   regular  Vitals Entered By: Bishop Dublin, CMA (July 30, 2010 10:02 AM)  Physical Exam  General:  thin tan gentleman in no apparent distress, HEENT exam is benign, oropharynx is clear, neck is supple with no JVP or carotid bruits, heart sounds are regular with normal S1-S2 and no murmurs appreciated, lungs Half moderately decreased breath sounds throughout bilaterally, abdominal exam is benign, no significant lower extremity edema, neurologic exam is nonfocal, skin is warm and dry. Pulses are equal and symmetrical in his upper and lower extremities.   Impression & Recommendations:  Problem # 1:  CAD, NATIVE VESSEL (ICD-414.01) severe CAD with recent stent placement x2. He needs to be on Plavix with his aspirin. We will start him on effient given that he is high risk of stent thrombosis.  we will also start him back on a statin, with Crestor 5 mg titrating up to 10 mg daily. We will check his lipids and liver in 2 to 3 months time.  The following medications were removed from the medication list:    Lisinopril 5 Mg Tabs (Lisinopril) ..... Hold His updated medication list for this problem includes:    Aspirin 81 Mg Tbec (Aspirin) .Leslie Sims... 2 by mouth once daily    Carvedilol  3.125 Mg Tabs (Carvedilol) .Leslie Sims... Take1/2 tablet by mouth twice a day    Nitrostat 0.4 Mg Subl (Nitroglycerin) .Leslie Sims... 1 tablet under tongue at onset of chest pain; you may repeat every 5 minutes for up to 3 doses.    Effient 10 Mg Tabs (Prasugrel hcl) .Leslie Sims... Take one tablet by mouth daily  Problem # 2:  UNSPECIFIED ESSENTIAL HYPERTENSION (ICD-401.9) blood pressure is reasonably well controlled on his current medication regimen.  The following medications were removed from the medication list:    Lisinopril 5 Mg Tabs (Lisinopril) ..... Hold His updated medication list for this problem includes:    Aspirin 81 Mg Tbec (Aspirin) .Leslie Sims... 2 by mouth once daily    Carvedilol 3.125 Mg Tabs (Carvedilol) .Leslie Sims... Take1/2 tablet by mouth twice a day  Problem # 3:  HYPERLIPIDEMIA (ICD-272.4) starting Crestor 5 mg titrating up to 10 mg. Myalgias on Lipitor.  The following medications were removed from the medication list:    Lipitor 20 Mg Tabs (Atorvastatin calcium) ..... Hold His updated medication list for this problem includes:    Crestor 10 Mg Tabs (Rosuvastatin calcium) .Leslie Sims... Take one tablet by mouth daily.  Problem # 4:  SMOKER (ICD-305.1)  We talked extensively to him about his smoking. He is smoking more than before. He will try alternates to see if he can decrease his amount of smoking.  Patient Instructions: 1)  Your physician has recommended you make the following change in your medication: START effient daily and crestor start with 1/2 pill if you have no muscle aches start taking 1 whole tabs.  2)  Your physician wants you to follow-up in:  6 months  You will receive a reminder letter in the mail two months in advance. If you don't receive a letter, please call our office to schedule the follow-up appointment. 3)  Your physician recommends that you return for a FASTING lipid profile: in 3 months (lip/lft) Prescriptions: CRESTOR 10 MG TABS (ROSUVASTATIN CALCIUM) Take one tablet by mouth daily.   #30 x 6   Entered by:   Benedict Needy, RN   Authorized by:   Dossie Arbour MD   Signed by:   Benedict Needy, RN on 07/30/2010   Method used:   Electronically to        [facility] (retail)       [address]       [place], [place]  [address]       Ph: [phone]       Fax: [phone]   RxID:   [id number] EFFIENT 10 MG TABS (PRASUGREL HCL) Take one tablet by mouth daily  #30 x 6   Entered by:   Benedict Needy, RN   Authorized by:   Dossie Arbour MD   Signed by:   Benedict Needy, RN on 07/30/2010   Method used:   Electronically to        [facility] (retail)       [address]       [place], [place]  [address]       Ph: [phone]       Fax: [phone]   RxID:   [id number]

## 2011-01-20 NOTE — Progress Notes (Signed)
Summary: Cardiology Phone Note - neck pain and headaches  Phone Note Call from Patient   Caller: Patient Summary of Call: pt has been having vague burning in his upper chest and neck assoc. with headaches since last wed.  can generally last all day w/o assoc. ss.  had similar ss in may when seen by dr. Mariah Milling.  he says at that time ss just went away but are now back.  he has not taken anything for ss. by his description it's difficult to tell what's going on but ss sound more flu-like/myalgias.  he is clear that ss are different from prev. mi ss.  i rec that if ss are concerning that he should present to local ed for eval.  otw, he should try tylenol and hydration for myalgias.  he was grateful for the call back. Initial call taken by: Creig Hines, ANP-BC,  June 22, 2010 10:34 AM

## 2011-01-20 NOTE — Letter (Signed)
Summary: Medical Record Release  Medical Record Release   Imported By: Harlon Flor 04/03/2010 11:00:15  _____________________________________________________________________  External Attachment:    Type:   Image     Comment:   External Document

## 2011-01-20 NOTE — Progress Notes (Signed)
Summary: Cardiac enzyme results  Phone Note Other Incoming   Caller: [facility] Summary of Call: Calling results of cardiac enzymes CK-56 (normal) CKMB 3.6 (normal) Troponin I <0.31 (normal). Initial call taken by: Cloyde Reams RN,  Apr 29, 2010 4:01 PM  Follow-up for Phone Call        Attempted to call pt with results.  LMOM TCB. Cloyde Reams RN  Apr 29, 2010 4:01 PM   SPOKE WITH PT AWARE OF RESULTS.   Follow-up by: Cloyde Reams RN,  Apr 29, 2010 5:01 PM

## 2011-01-20 NOTE — Progress Notes (Signed)
Summary: GOING OUT OF TOWN  Phone Note Call from Patient Call back at Home Phone [phone]   Caller: SELF Call For: [facility] Summary of Call: PT WANTS TO MAKE SURE THAT THE STRESS TEST WAS GOOD ENOUGH FOR THE PT TO GO OUT OF TOWN Initial call taken by: Harlon Flor,  October 16, 2010 11:06 AM  Follow-up for Phone Call        Called spoke with pt going [place] of town to beach for his [place] today! Wished pt Happy Birthday and advised letter was mailed, results stable, continue medical management.  Follow-up by: Cloyde Reams RN,  October 16, 2010 11:14 AM

## 2011-01-20 NOTE — Progress Notes (Signed)
Summary: Nuclear Pre-Procedure  Phone Note Outgoing Call   Call placed by: Milana Na, EMT-P,  October 08, 2010 4:07 PM Summary of Call: Left message with information on Myoview Information Sheet (see scanned document for details).      Nuclear Med Background Indications for Stress Test: Evaluation for Ischemia, Stent Patency   History: COPD, Echo, GXT, Heart Catheterization, Myocardial Infarction, Myocardial Perfusion Study, Stents  History Comments: 12/10 MI --Stents LAD 03/11 ECHO EF 45-50% MR 03/11 MPS EF 65%   Symptoms: Chest Tightness, DOE, SOB    Nuclear Pre-Procedure Cardiac Risk Factors: Hypertension, Lipids, Smoker Height (in): 70  Nuclear Med Study Referring MD:  D.McLean

## 2011-01-21 NOTE — Progress Notes (Signed)
Summary: RX  Phone Note Refill Request Call back at Home Phone [phone] Message from:  Patient on January 12, 2011 11:05 AM  Refills Requested: Medication #1:  EFFIENT 10 MG TABS Take one tablet by mouth daily [facility]  Initial call taken by: Harlon Flor,  January 12, 2011 11:05 AM    Prescriptions: EFFIENT 10 MG TABS (PRASUGREL HCL) Take one tablet by mouth daily  #30 x 6   Entered by:   Bishop Dublin, CMA   Authorized by:   Dossie Arbour MD   Signed by:   Bishop Dublin, CMA on 01/12/2011   Method used:   Faxed to ...       [facility] (mail-order)             , [place]         Ph: [phone]       Fax: [phone]   RxID:   [id number] EFFIENT 10 MG TABS (PRASUGREL HCL) Take one tablet by mouth daily  #30 x 6   Entered by:   Bishop Dublin, CMA   Authorized by:   Dossie Arbour MD   Signed by:   Bishop Dublin, CMA on 01/12/2011   Method used:   Electronically to        [facility] (retail)       [address]       [place], [place]  [address]       Ph: [phone]       Fax: [phone]   RxID:   [id number]  Notified pharmacist tech Luster Landsberg) need to cancel Rx that was sent in for effient he uses the [facility] services.

## 2011-01-21 NOTE — Assessment & Plan Note (Signed)
Summary: NURSE/AMD  Nurse Visit   Vital Signs:  Patient profile:   60 year old male Height:      70 inches Weight:      158 pounds BMI:     22.75 Pulse rate:   56 / minute BP sitting:   100 / 64  (left arm) Cuff size:   regular  Vitals Entered By: Lanny Hurst RN (December 02, 2010 4:10 PM)  Visit Type:  nurse  CC:  Hematoma.  CC: Hematoma Comments Pt in office c/o bruise noted left upper chest near left axilla. Pt reports only mild soreness occasionally. He cannot recall any incident causing bruise. Upon palpation noted small hematoma 1cm in diameter with mild discoloration around about inches. Advised pt to keep a monitor on site and to notify office of any changes in color or increase in size. Pt is ok with this.    Allergies: No Known Drug Allergies

## 2011-01-29 ENCOUNTER — Telehealth: Payer: Self-pay | Admitting: Cardiovascular Disease

## 2011-02-04 NOTE — Progress Notes (Signed)
Summary: RX  Phone Note Refill Request Call back at Home Phone [phone] Message from:  Patient on January 29, 2011 9:41 AM  Refills Requested: Medication #1:  BISOPROLOL FUMARATE 5 MG TABS take 1/2 tablet once daily. [facility]  Initial call taken by: Harlon Flor,  January 29, 2011 9:41 AM    Prescriptions: BISOPROLOL FUMARATE 5 MG TABS (BISOPROLOL FUMARATE) take 1/2 tablet once daily  #15 x 3   Entered by:   Lysbeth Galas CMA   Authorized by:   Dossie Arbour MD   Signed by:   Lysbeth Galas CMA on 01/29/2011   Method used:   Electronically to        [facility] (retail)       [address]       [place], [place]  [address]       Ph: [phone]       Fax: [phone]   RxID:   [id number]

## 2011-02-17 ENCOUNTER — Telehealth: Payer: Self-pay | Admitting: Cardiovascular Disease

## 2011-02-19 ENCOUNTER — Encounter: Payer: Self-pay | Admitting: Cardiovascular Disease

## 2011-02-25 NOTE — Progress Notes (Signed)
Summary: RX  Phone Note Refill Request Call back at Home Phone [phone] Message from:  Patient on February 17, 2011 11:47 AM  Refills Requested: Medication #1:  EFFIENT 10 MG TABS Take one tablet by mouth daily   Notes: Pt has 6 refills left, but the RX is written to give the pt 1 month at a time.  Pt needs 3 months at a time. [facility]  Initial call taken by: Harlon Flor,  February 17, 2011 11:47 AM  Follow-up for Phone Call        spoke with pharmacist @ [facility] told need to make sure patient get a 90 day supply with the next refill. patient also aware. Follow-up by: Bishop Dublin, CMA,  February 17, 2011 12:33 PM    Prescriptions: EFFIENT 10 MG TABS (PRASUGREL HCL) Take one tablet by mouth daily  #90 x 3   Entered by:   Bishop Dublin, CMA   Authorized by:   Dossie Arbour MD   Signed by:   Bishop Dublin, CMA on 02/17/2011   Method used:   Faxed to ...       [facility] (mail-order)             , [place]         Ph: [phone]       Fax: [phone]   RxID:   [id number]

## 2011-03-03 ENCOUNTER — Ambulatory Visit: Payer: Self-pay | Admitting: Gastroenterology

## 2011-03-06 LAB — PATHOLOGY REPORT

## 2011-03-25 ENCOUNTER — Ambulatory Visit: Payer: Self-pay | Admitting: Family Medicine

## 2011-03-25 IMAGING — CR DG CHEST 2V
1 series · 4 of 4 positions shown · non-contrast
Comparison: none

REASON FOR EXAM: cough
COMMENTS:

PROCEDURE:     KDR - KDXR CHEST PA (OR AP) AND LAT  - [DATE] [DATE]
RESULT:     Comparison is made to the prior exam of [DATE]. The lung
fields are clear. No pneumonia, pneumothorax or pleural effusion is seen.
Heart size is normal.

[Series 1: view not recorded · 0.17mm/px · 4 of 4 slices shown]
[im 1/4]
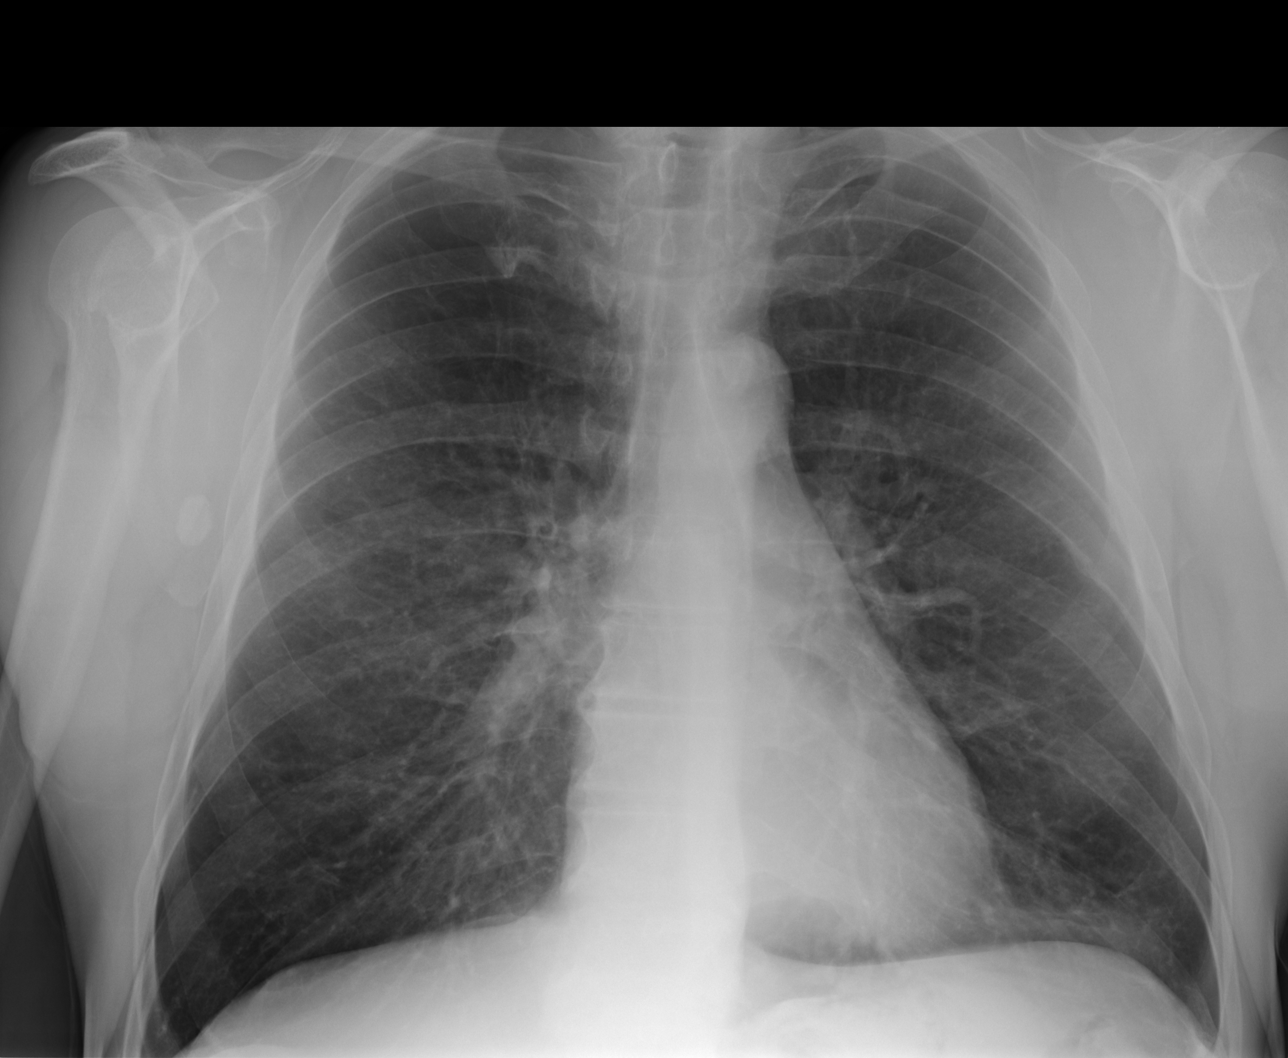
[im 2/4]
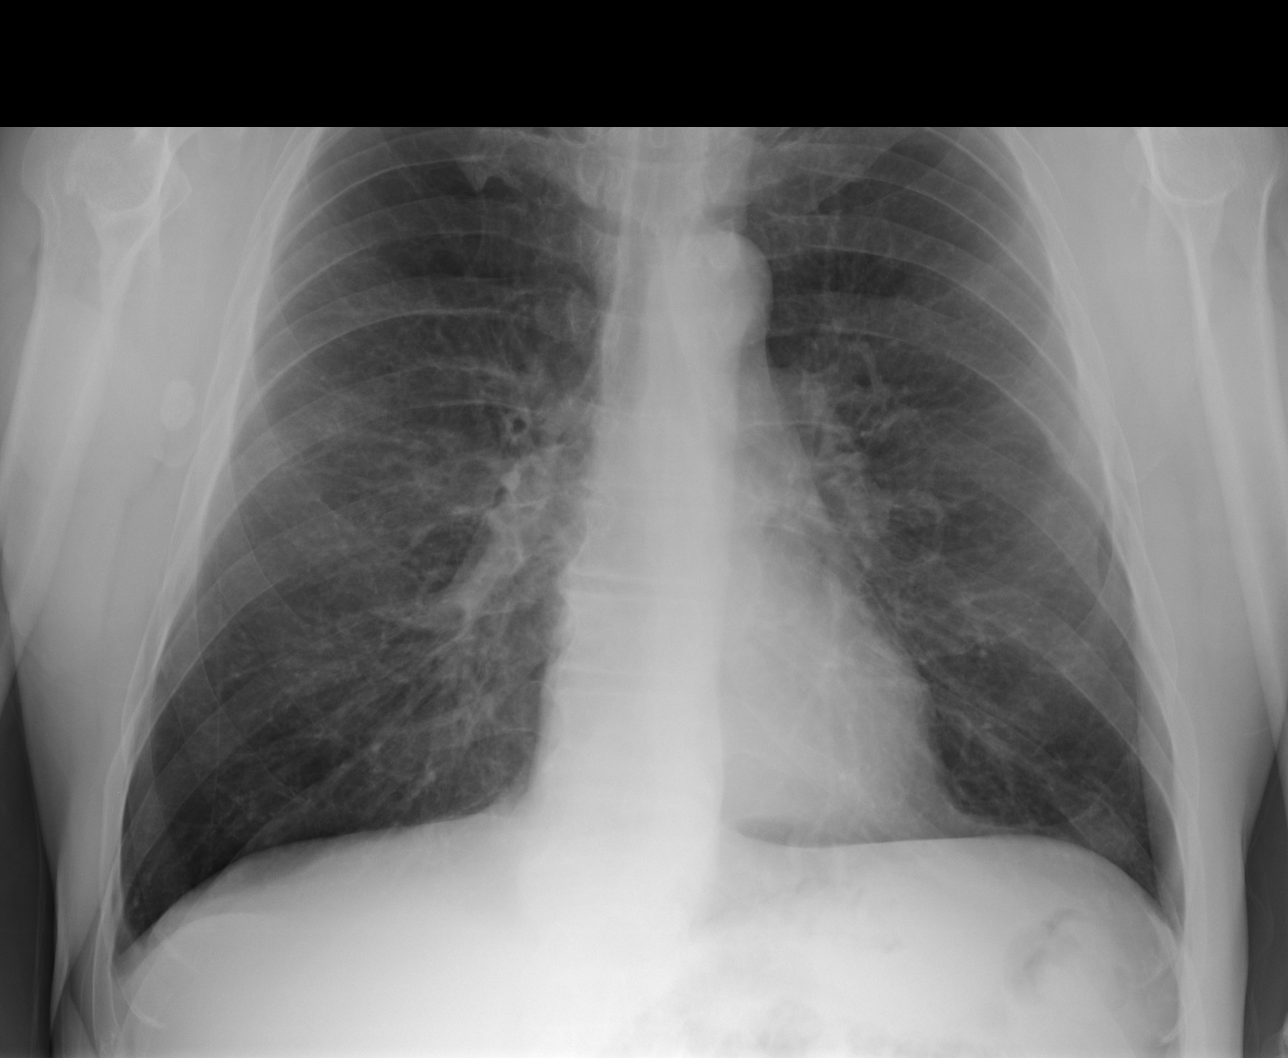
[im 3/4]
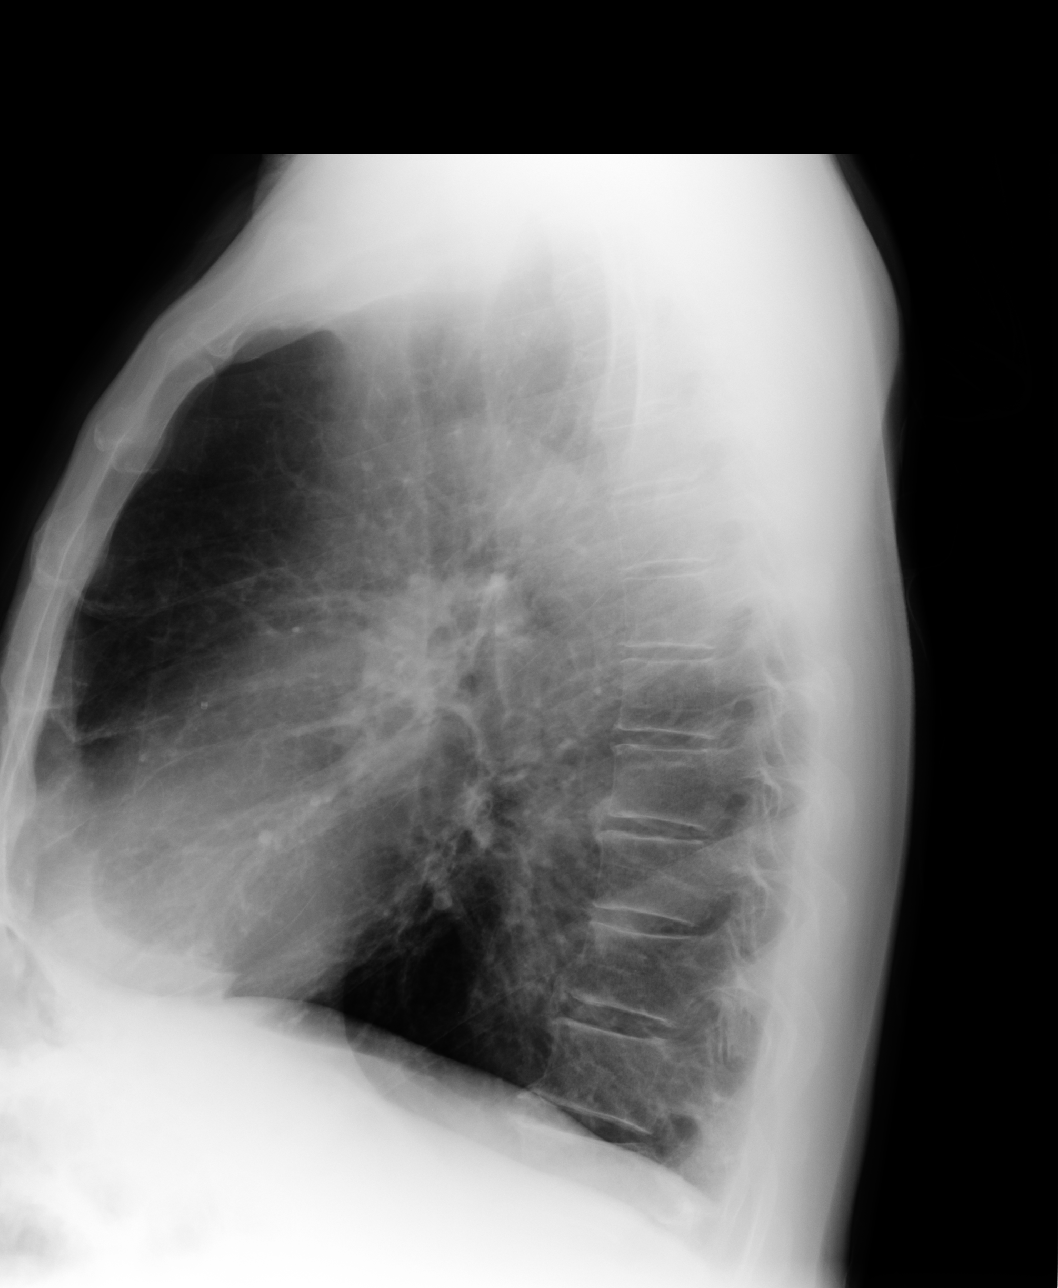
[im 4/4]
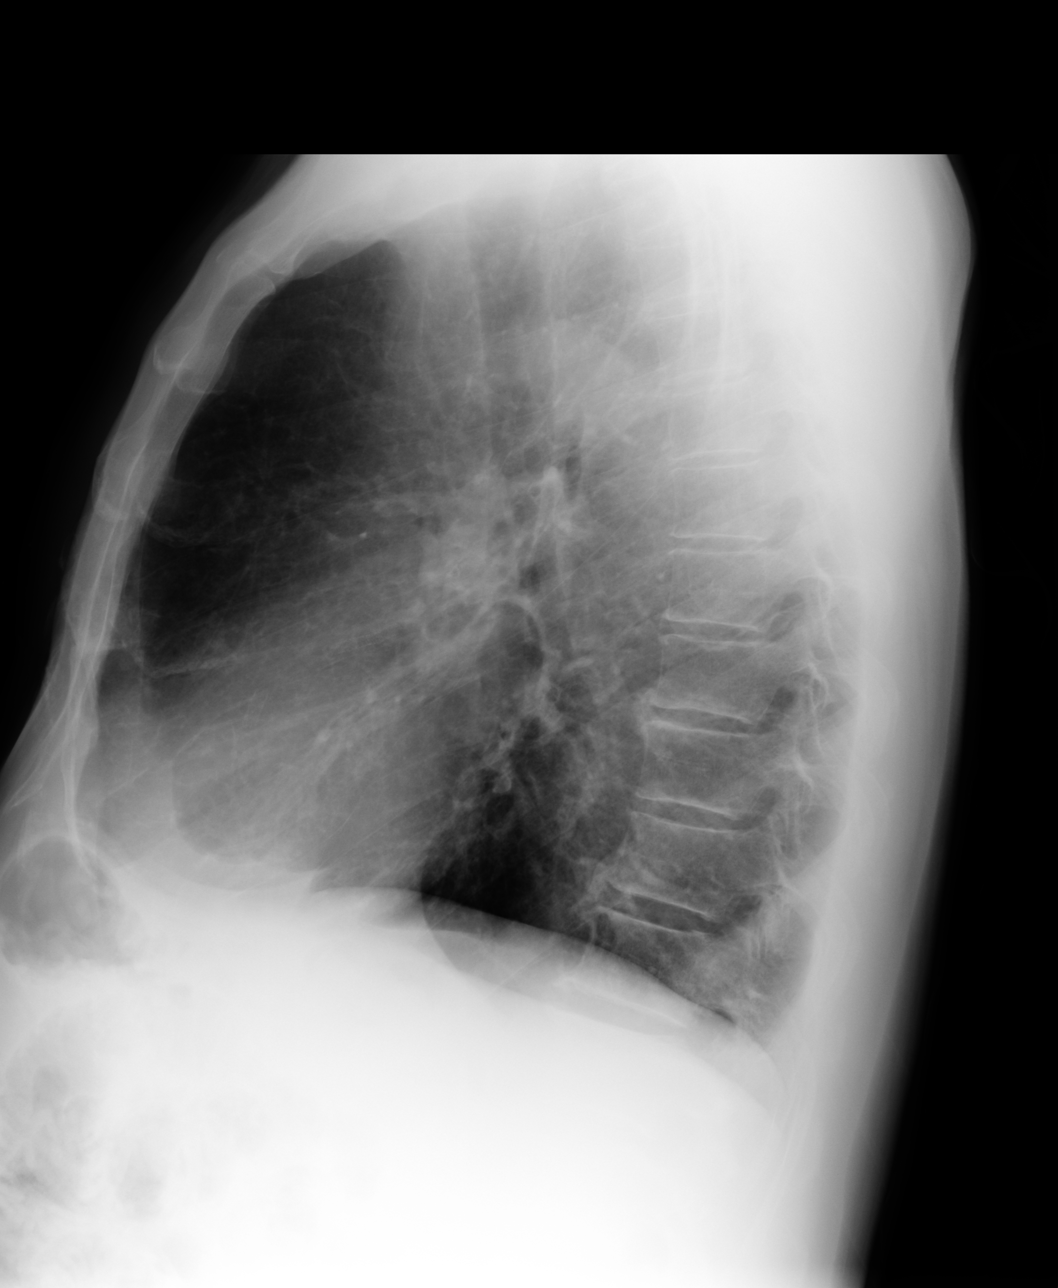

[4 of 4 positions shown; findings below may reference images not displayed]

IMPRESSION: 1.     No acute changes are identified.

## 2011-04-20 ENCOUNTER — Ambulatory Visit (INDEPENDENT_AMBULATORY_CARE_PROVIDER_SITE_OTHER): Payer: BC Managed Care – PPO | Admitting: Cardiovascular Disease

## 2011-04-20 ENCOUNTER — Encounter: Payer: Self-pay | Admitting: Cardiovascular Disease

## 2011-04-20 DIAGNOSIS — J4489 Other specified chronic obstructive pulmonary disease: Secondary | ICD-10-CM

## 2011-04-20 DIAGNOSIS — F17200 Nicotine dependence, unspecified, uncomplicated: Secondary | ICD-10-CM

## 2011-04-20 DIAGNOSIS — I10 Essential (primary) hypertension: Secondary | ICD-10-CM

## 2011-04-20 DIAGNOSIS — J449 Chronic obstructive pulmonary disease, unspecified: Secondary | ICD-10-CM

## 2011-04-20 DIAGNOSIS — I2510 Atherosclerotic heart disease of native coronary artery without angina pectoris: Secondary | ICD-10-CM

## 2011-04-20 DIAGNOSIS — I428 Other cardiomyopathies: Secondary | ICD-10-CM

## 2011-04-20 DIAGNOSIS — D126 Benign neoplasm of colon, unspecified: Secondary | ICD-10-CM

## 2011-04-20 DIAGNOSIS — R001 Bradycardia, unspecified: Secondary | ICD-10-CM

## 2011-04-20 DIAGNOSIS — E785 Hyperlipidemia, unspecified: Secondary | ICD-10-CM

## 2011-04-20 DIAGNOSIS — F341 Dysthymic disorder: Secondary | ICD-10-CM

## 2011-04-20 MED ORDER — ROSUVASTATIN CALCIUM 10 MG PO TABS: 10.0000 mg | ORAL_TABLET | Freq: Every day | ORAL | Status: DC

## 2011-04-20 MED ORDER — NITROGLYCERIN 0.4 MG SL SUBL: 0.4000 mg | SUBLINGUAL_TABLET | SUBLINGUAL | Status: DC | PRN

## 2011-04-20 NOTE — Assessment & Plan Note (Signed)
We have encouraged him to continue to work on weaning his cigarettes and smoking cessation. He will continue to work on this and does not want any assistance with chantix.  

## 2011-04-20 NOTE — Progress Notes (Signed)
   Patient ID: Leslie Sims, male    DOB: Apr 03, 1951, 60 y.o.   MRN: [id number]  HPI Comments: 60 yo with history of CAD s/p MI in 12/10 with DES to proximal LAD and mid RCA as well as mild ischemic CM and COPD presents for cardiology followup. he has a long smoking hx.     He has been very active and has no complaints of chest pain or shortness of breath. He continues to smoke who was working with his wife to stop smoking. She is going to a class. He is trying to wean the number of cigarettes down.[name]  He reports having some fatigue.    His old stress test showed regions of scar that was consistent with previous stress test. No significant ischemia noted. Medical management was recommended.   EKG shows sinus bradycardia with rate 51 beats per minute with old anterior infarct, nonspecific ST changes      Review of Systems  Constitutional: Negative.   HENT: Negative.   Eyes: Negative.   Respiratory: Negative.   Cardiovascular: Negative.   Gastrointestinal: Negative.   Musculoskeletal: Negative.   Skin: Negative.   Neurological: Negative.   Hematological: Negative.   Psychiatric/Behavioral: Negative.   All other systems reviewed and are negative.    BP 108/68  Pulse 53  Ht 5\' 10"  (1.778 m)  Wt 161 lb (73.029 kg)  BMI 23.10 kg/m2   Physical Exam  Nursing note and vitals reviewed. Constitutional: He is oriented to person, place, and time. He appears well-developed and well-nourished.  HENT:  Head: Normocephalic.  Nose: Nose normal.  Mouth/Throat: Oropharynx is clear and moist.  Eyes: Conjunctivae are normal. Pupils are equal, round, and reactive to light.  Neck: Normal range of motion. Neck supple. No JVD present.  Cardiovascular: Normal rate, regular rhythm, S1 normal, S2 normal, normal heart sounds and intact distal pulses.  Exam reveals no gallop and no friction rub.   No murmur heard. Pulses:      Carotid pulses are 2+ on the right side, and 2+ on the left side.      Radial pulses are 2+ on the right side, and 2+ on the left side.       Posterior tibial pulses are 1+ on the right side, and 1+ on the left side.  Pulmonary/Chest: Effort normal and breath sounds normal. No respiratory distress. He has no wheezes. He has no rales. He exhibits no tenderness.  Abdominal: Soft. Bowel sounds are normal. He exhibits no distension. There is no tenderness.  Musculoskeletal: Normal range of motion. He exhibits no edema and no tenderness.  Lymphadenopathy:    He has no cervical adenopathy.  Neurological: He is alert and oriented to person, place, and time. Coordination normal.  Skin: Skin is warm and dry. No rash noted. No erythema.  Psychiatric: He has a normal mood and affect. His behavior is normal. Judgment and thought content normal.           Assessment and Plan

## 2011-04-20 NOTE — Assessment & Plan Note (Signed)
Heart rate is low and he does have fatigue at baseline. We will hold his bisoprolol. We have suggested he closely monitor his blood pressure.

## 2011-04-20 NOTE — Patient Instructions (Signed)
Hold your bisoprolol. We have sent refills for your nitroglycerin and Crestor. Please call our office with any issues needed to be addressed prior to your next follow up. Your physician recommends that you schedule a follow-up appointment in: 6 months

## 2011-04-20 NOTE — Assessment & Plan Note (Signed)
Cholesterol is at goal on the current lipid regimen. No changes to the medications were made.  

## 2011-04-20 NOTE — Assessment & Plan Note (Addendum)
Blood pressure is well controlled on today's visit. We are holding his bisoprolol for bradycardia.

## 2011-04-20 NOTE — Assessment & Plan Note (Signed)
Currently with no symptoms of angina. No further workup at this time. Continue current medication regimen. 

## 2011-06-17 ENCOUNTER — Other Ambulatory Visit: Payer: Self-pay | Admitting: Cardiovascular Disease

## 2011-06-17 MED ORDER — ROSUVASTATIN CALCIUM 10 MG PO TABS: 10.0000 mg | ORAL_TABLET | Freq: Every day | ORAL | Status: DC

## 2011-06-17 NOTE — Telephone Encounter (Signed)
Refilled medications per Lanny Hurst.

## 2011-09-14 ENCOUNTER — Ambulatory Visit: Payer: Self-pay | Admitting: Family Medicine

## 2011-09-14 IMAGING — CR DG CHEST 2V
1 series · 4 of 4 positions shown · non-contrast
Comparison: none

REASON FOR EXAM: cough
COMMENTS:

[Series 1: view not recorded · 0.17mm/px · 4 of 4 slices shown]
[im 1/4]
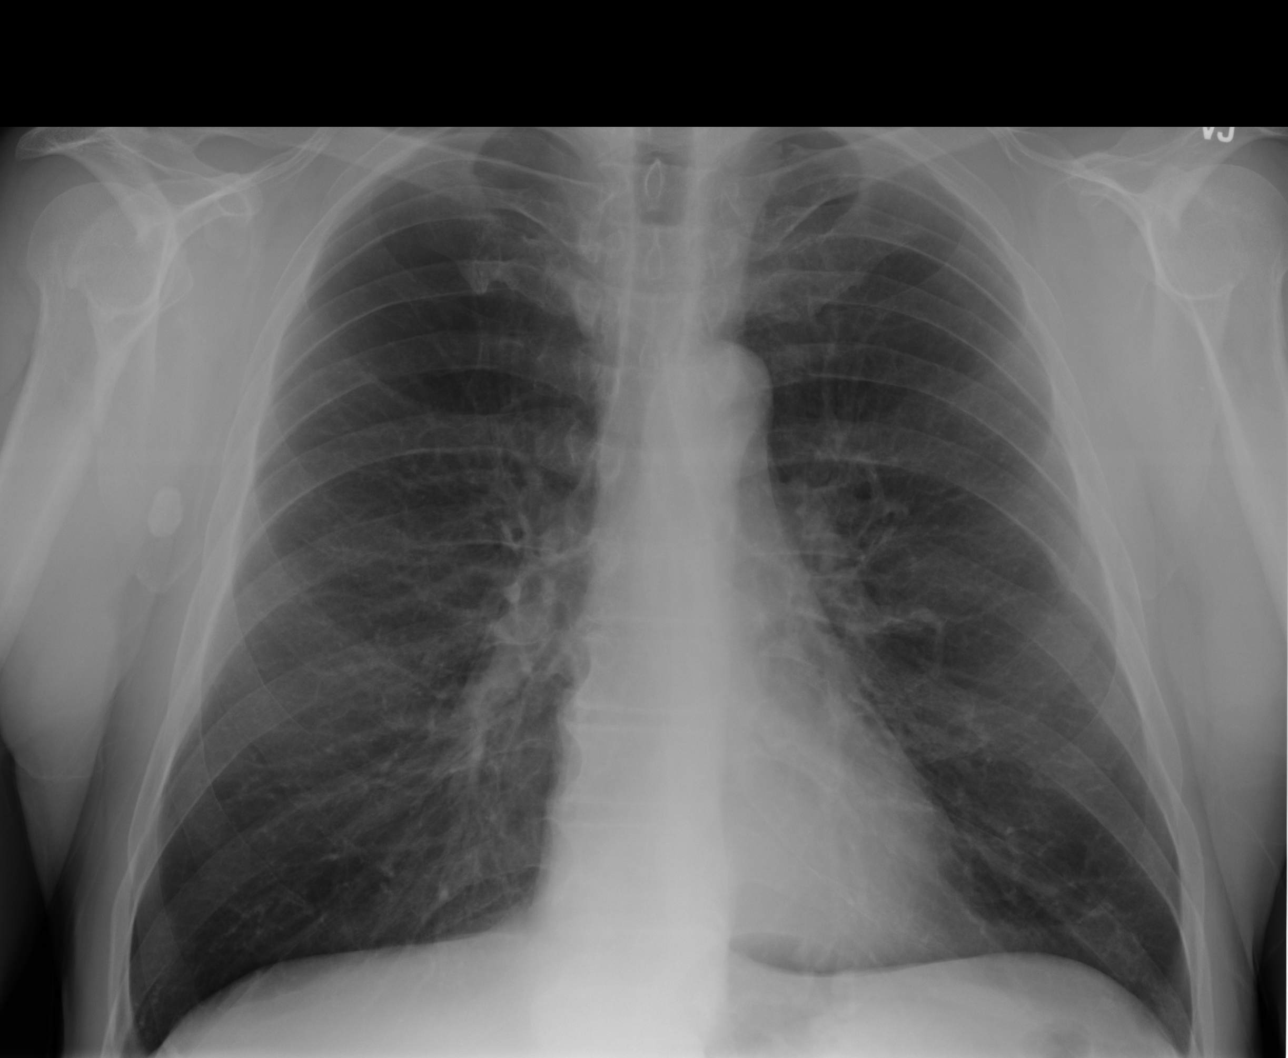
[im 2/4]
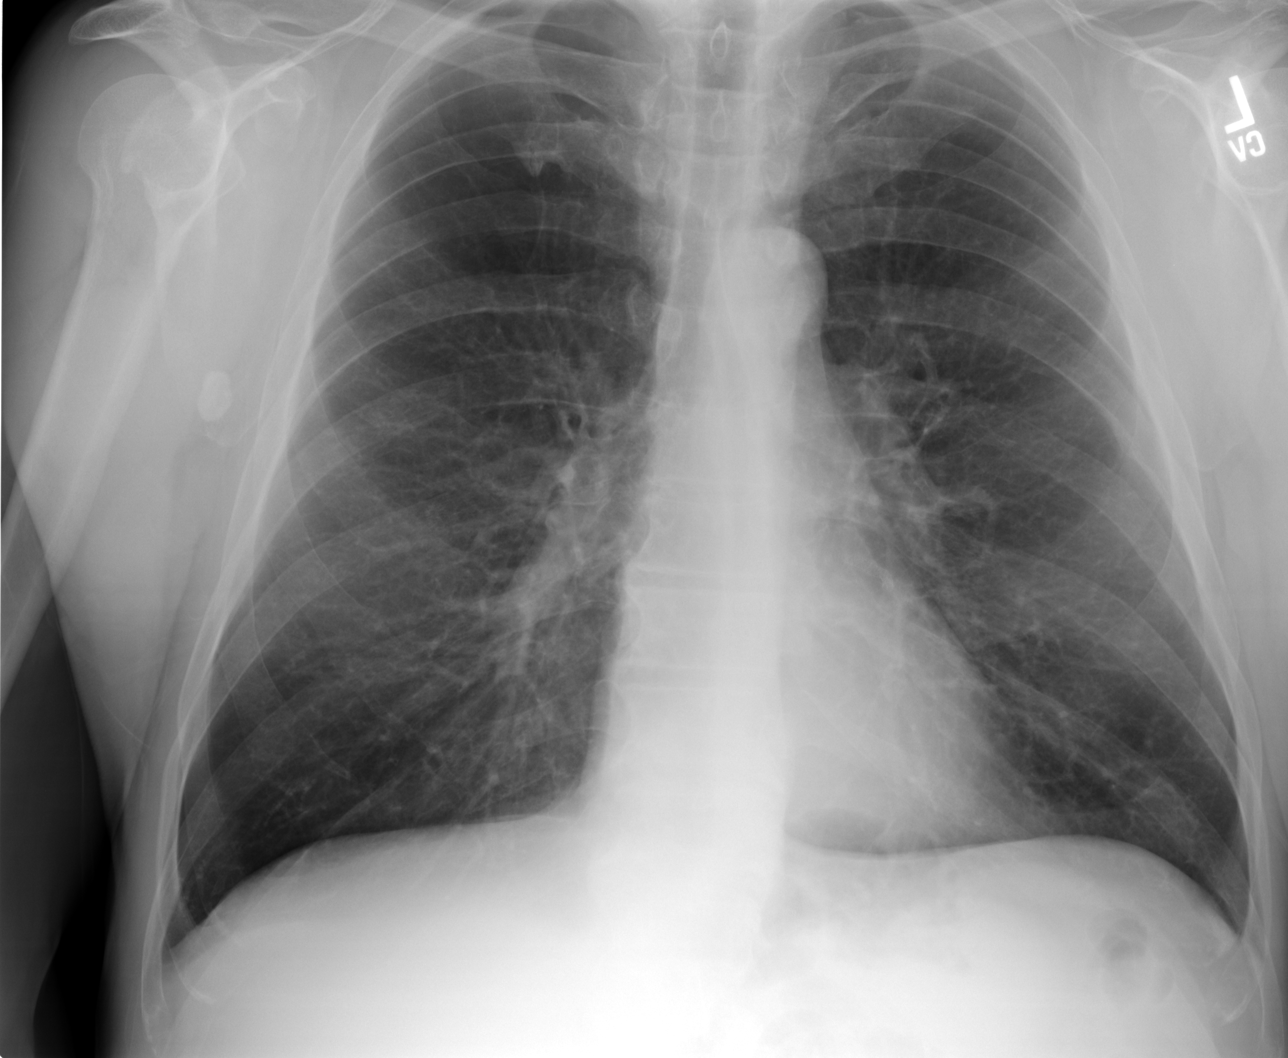
[im 3/4]
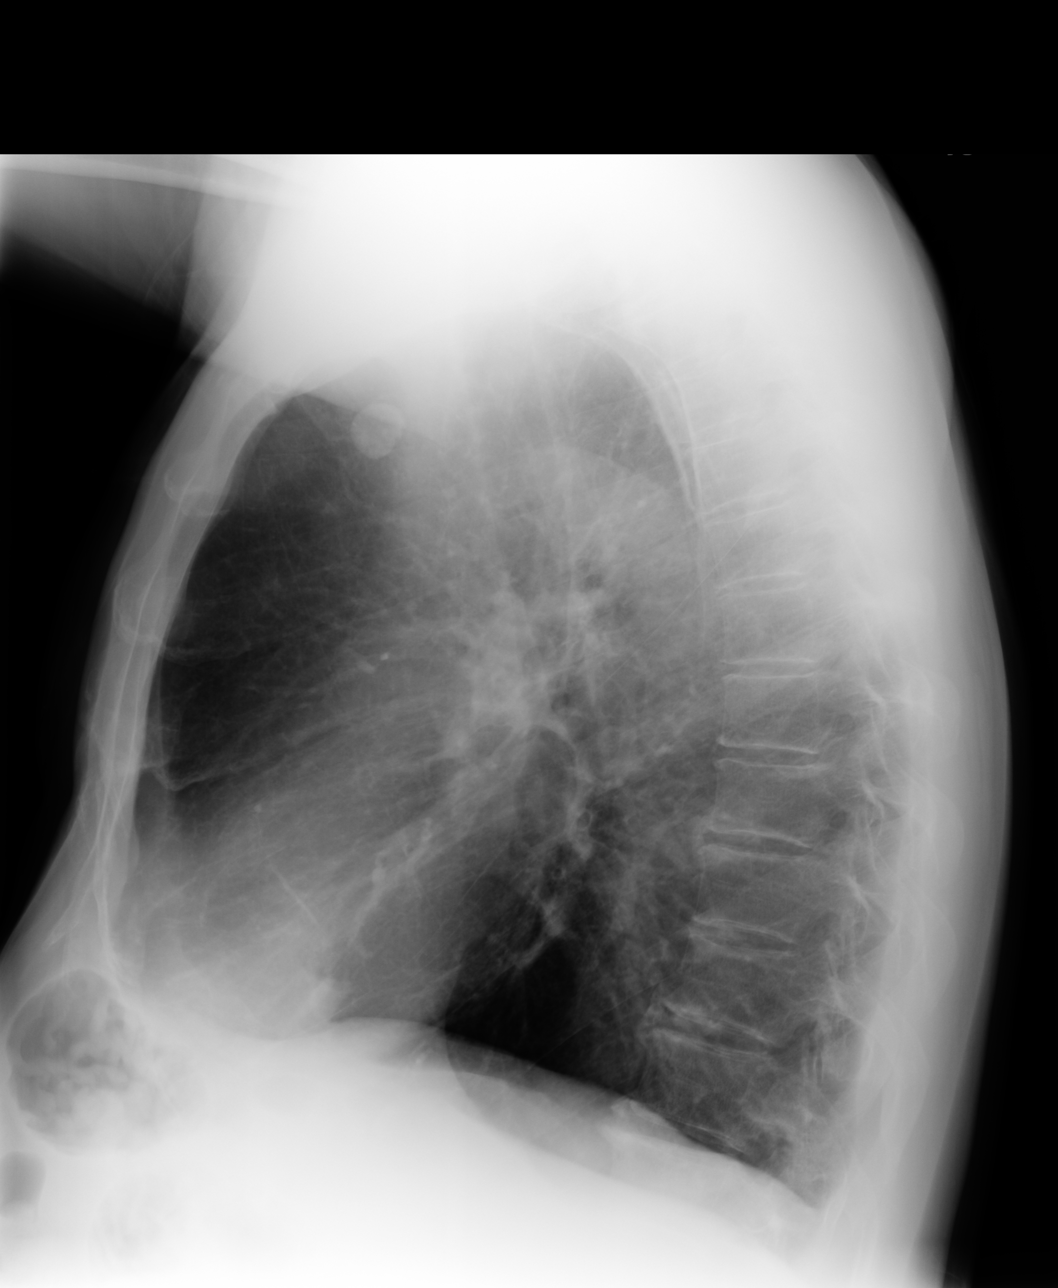
[im 4/4]
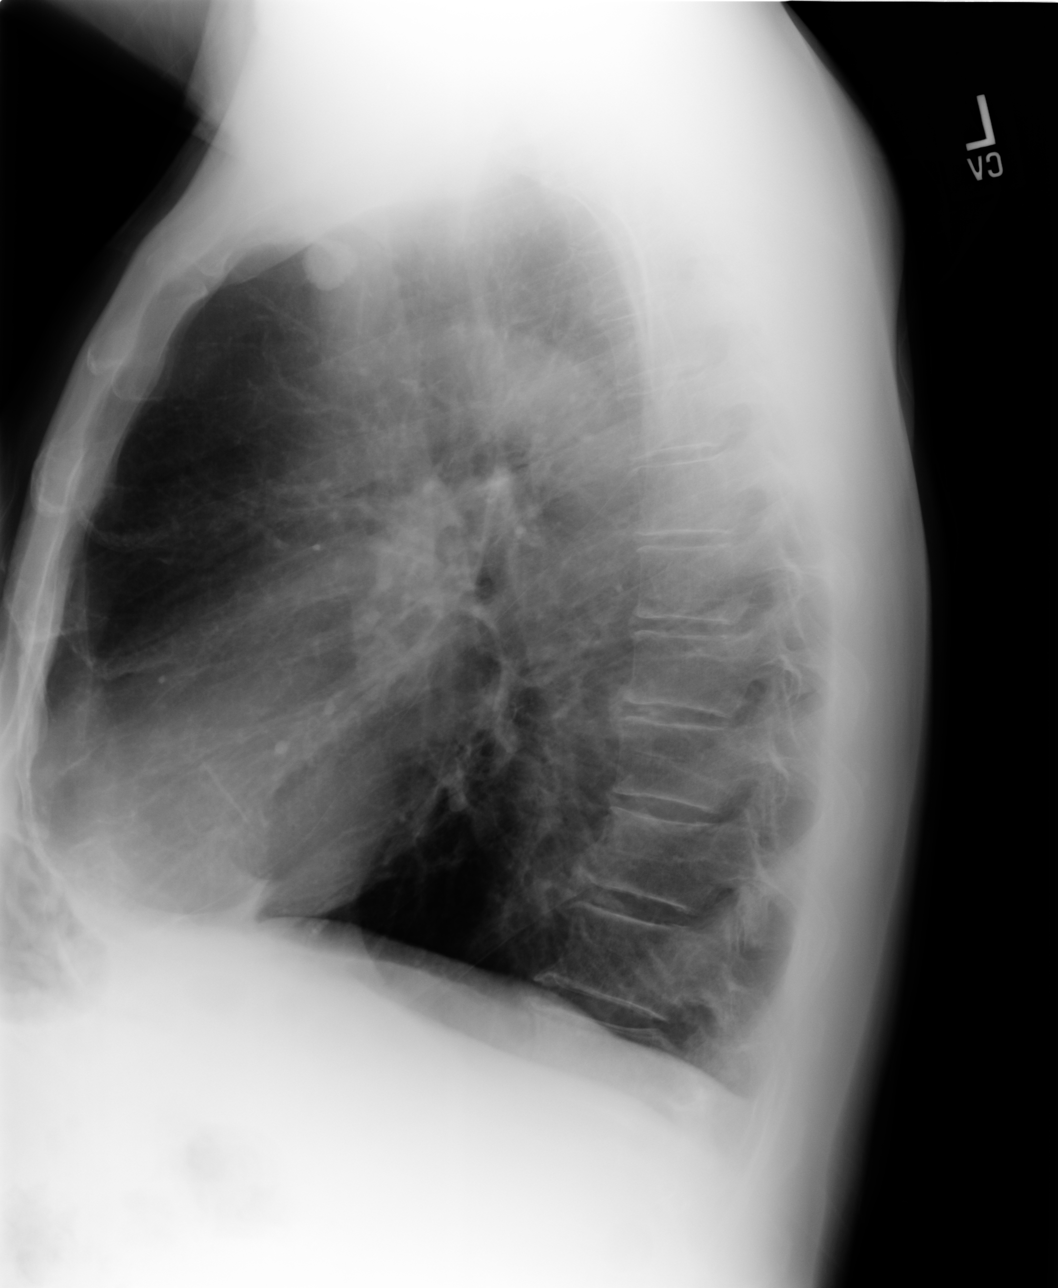

[4 of 4 positions shown; findings below may reference images not displayed]

PROCEDURE:     KDR - KDXR CHEST PA (OR AP) AND LAT  - [DATE] [DATE]

RESULT:     Comparison is made to the prior exam of [DATE].

The lung fields are clear of infiltrate. No pneumonia, pneumothorax or
pleural effusion is seen. Heart size is normal. The lungs are bilaterally
hyperinflated but no progressive hyperinflation is seen as compared to the
prior exam. There is a 1.8 cm calcification projected over the soft tissues
of the right lateral hemithorax. This is stable in appearance as compared to
the prior examinations of [DATE] and [DATE].
IMPRESSION: 1. The lung fields are clear.
2. The chest is bilaterally hyperinflated suggestive of a history of COPD or
asthma.
3. There is a stable soft tissue calcification along the right lateral
hemithorax.

## 2011-10-08 ENCOUNTER — Encounter: Payer: Self-pay | Admitting: Cardiovascular Disease

## 2011-10-18 ENCOUNTER — Encounter: Payer: Self-pay | Admitting: Cardiovascular Disease

## 2011-10-18 ENCOUNTER — Ambulatory Visit (INDEPENDENT_AMBULATORY_CARE_PROVIDER_SITE_OTHER): Payer: BC Managed Care – PPO | Admitting: Cardiovascular Disease

## 2011-10-18 DIAGNOSIS — I428 Other cardiomyopathies: Secondary | ICD-10-CM

## 2011-10-18 DIAGNOSIS — I10 Essential (primary) hypertension: Secondary | ICD-10-CM

## 2011-10-18 DIAGNOSIS — I2510 Atherosclerotic heart disease of native coronary artery without angina pectoris: Secondary | ICD-10-CM

## 2011-10-18 DIAGNOSIS — F17200 Nicotine dependence, unspecified, uncomplicated: Secondary | ICD-10-CM

## 2011-10-18 DIAGNOSIS — E785 Hyperlipidemia, unspecified: Secondary | ICD-10-CM

## 2011-10-18 NOTE — Assessment & Plan Note (Signed)
Cholesterol is at goal on the current lipid regimen. No changes to the medications were made.  

## 2011-10-18 NOTE — Assessment & Plan Note (Signed)
Blood pressure is well controlled on today's visit. No changes made to the medications. 

## 2011-10-18 NOTE — Assessment & Plan Note (Signed)
Currently with no symptoms of angina. No further workup at this time. Continue current medication regimen. I have suggested when he runs out of effient that he contact us and we will change him to Plavix 75 mg daily with aspirin 81 mg daily.

## 2011-10-18 NOTE — Patient Instructions (Addendum)
You are doing well.  Call the office when the effient in almost out  We will change the effient to plavix 75 mg once a day Stay on aspirin 81 mg daily  Cal the office for chest pains  Please call us if you have new issues that need to be addressed before your next appt.  The office will contact you for a follow up Appt. In 12 months

## 2011-10-18 NOTE — Assessment & Plan Note (Signed)
We spent some time talking to him about smoking cessation and the importance that he quit. He is certainly at high risk of in-stent restenosis or development of a new lesion. He seems to understand this and will continue to try to quit. He reports having nicotine patches and lozenges.

## 2011-10-18 NOTE — Progress Notes (Signed)
Patient ID: Leslie Sims, male    DOB: 1951/08/12, 60 y.o.   MRN: [id number]  HPI Comments: 60 yo with history of CAD s/p MI in 12/10 with DES to proximal LAD and mid RCA as well as mild ischemic CM and COPD presents for cardiology followup. he has a long smoking hx And continues to smoke.   He has been very active and has no complaints of chest pain or shortness of breath. He continues to smoke Though is trying to quit.  He reports having some fatigue.    His old stress test showed regions of scar that was consistent with previous stress test. No significant ischemia noted. Medical management was recommended.   EKG shows sinus bradycardia with rate 57 beats per minute with old anterior infarct, nonspecific ST changes    Outpatient Encounter Prescriptions as of 10/18/2011  Medication Sig Dispense Refill  . albuterol (VENTOLIN HFA) 108 (90 BASE) MCG/ACT inhaler Inhale 2 puffs into the lungs every 6 (six) hours as needed.        [name] aspirin 81 MG tablet Take 81 mg by mouth daily.        . citalopram (CELEXA) 40 MG tablet Take 40 mg by mouth daily.        [name] esomeprazole (NEXIUM) 40 MG capsule Take 40 mg by mouth daily before breakfast.        . Fluticasone-Salmeterol (ADVAIR DISKUS) 500-50 MCG/DOSE AEPB Inhale 1 puff into the lungs every 12 (twelve) hours.        [name] levalbuterol (XOPENEX) 0.63 MG/3ML nebulizer solution Take 1 ampule by nebulization every 4 (four) hours as needed.        . nitroGLYCERIN (NITROSTAT) 0.4 MG SL tablet Place 1 tablet (0.4 mg total) under the tongue every 5 (five) minutes as needed.  25 tablet  3  . prasugrel (EFFIENT) 10 MG TABS Take 10 mg by mouth daily.        . rosuvastatin (CRESTOR) 10 MG tablet Take 1 tablet (10 mg total) by mouth daily.  90 tablet  4  . tiotropium (SPIRIVA) 18 MCG inhalation capsule Place 18 mcg into inhaler and inhale daily.           Review of Systems  Constitutional: Negative.   HENT: Negative.   Eyes: Negative.   Respiratory:  Negative.   Cardiovascular: Negative.   Gastrointestinal: Negative.   Musculoskeletal: Negative.   Skin: Negative.   Neurological: Negative.   Hematological: Negative.   Psychiatric/Behavioral: Negative.   All other systems reviewed and are negative.    BP 130/86  Pulse 58  Ht 5\' 10"  (1.778 m)  Wt 163 lb 8 oz (74.163 kg)  BMI 23.46 kg/m2   Physical Exam  Nursing note and vitals reviewed. Constitutional: He is oriented to person, place, and time. He appears well-developed and well-nourished.  HENT:  Head: Normocephalic.  Nose: Nose normal.  Mouth/Throat: Oropharynx is clear and moist.  Eyes: Conjunctivae are normal. Pupils are equal, round, and reactive to light.  Neck: Normal range of motion. Neck supple. No JVD present.  Cardiovascular: Normal rate, regular rhythm, S1 normal, S2 normal, normal heart sounds and intact distal pulses.  Exam reveals no gallop and no friction rub.   No murmur heard. Pulses:      Carotid pulses are 2+ on the right side, and 2+ on the left side.      Radial pulses are 2+ on the right side, and 2+ on the left side.  Posterior tibial pulses are 1+ on the right side, and 1+ on the left side.  Pulmonary/Chest: Effort normal and breath sounds normal. No respiratory distress. He has no wheezes. He has no rales. He exhibits no tenderness.  Abdominal: Soft. Bowel sounds are normal. He exhibits no distension. There is no tenderness.  Musculoskeletal: Normal range of motion. He exhibits no edema and no tenderness.  Lymphadenopathy:    He has no cervical adenopathy.  Neurological: He is alert and oriented to person, place, and time. Coordination normal.  Skin: Skin is warm and dry. No rash noted. No erythema.  Psychiatric: He has a normal mood and affect. His behavior is normal. Judgment and thought content normal.           Assessment and Plan

## 2012-01-07 ENCOUNTER — Telehealth: Payer: Self-pay | Admitting: Cardiovascular Disease

## 2012-01-07 MED ORDER — CLOPIDOGREL BISULFATE 75 MG PO TABS: 75.0000 mg | ORAL_TABLET | Freq: Every day | ORAL | Status: AC

## 2012-01-07 NOTE — Telephone Encounter (Signed)
A new Rx sent for plavix. LMOM the effient is being changed to plavix.

## 2012-01-07 NOTE — Telephone Encounter (Signed)
Patient stopped by office and reported that he is almost out of effient.  He said at last office visit with DR. Gollan when the medication was almost out that he was going to call in a different medication for him that was cheaper.  Per office note patient can be switched to Plavix 75mg .  Patient would like this called into [facility] for a 90 day supply.

## 2012-01-17 ENCOUNTER — Telehealth: Payer: Self-pay | Admitting: Cardiovascular Disease

## 2012-01-17 NOTE — Telephone Encounter (Signed)
Pt has questions concerning his plavix mg. Please call pt concerning this

## 2012-01-17 NOTE — Telephone Encounter (Signed)
Pt wanted to make sure that this was the medication that replaced Effient. I told pt it was, and to only take after he has completed Effient.

## 2012-02-07 ENCOUNTER — Telehealth: Payer: Self-pay | Admitting: Cardiovascular Disease

## 2012-02-07 DIAGNOSIS — Z79899 Other long term (current) drug therapy: Secondary | ICD-10-CM

## 2012-02-07 DIAGNOSIS — E7800 Pure hypercholesterolemia, unspecified: Secondary | ICD-10-CM

## 2012-02-07 NOTE — Telephone Encounter (Signed)
Could try lipitor 20 mg daily, #90, refill x 4 Recheck lipids and LFTs in three months

## 2012-02-07 NOTE — Telephone Encounter (Signed)
Patient Would like to know if he can switch to Lipitor. He is  Taken   Crestor 10 mg daily . Patient said   Crestor is too expensive . Patient said MD had suggested it before. If MD okays the change, he would like to know, also need to send the prescription to [facility] pharmacy.

## 2012-02-07 NOTE — Telephone Encounter (Signed)
LMTCB ./CY 

## 2012-02-07 NOTE — Telephone Encounter (Signed)
Pt callings wanting to know if he can switch to lipitor from crestor. Crestor is getting to expensive for pt to fill. Pt uses [facility] and would need a script sent to them if ok to change. Pt would like a call either way and it is ok to leave a message on his VM.

## 2012-02-08 MED ORDER — ATORVASTATIN CALCIUM 20 MG PO TABS: 20.0000 mg | ORAL_TABLET | Freq: Every day | ORAL | Status: DC

## 2012-02-08 NOTE — Telephone Encounter (Signed)
Patient aware that is okay with DR. Gollan to try Lipitor 20 mg one tablet by mouth daily, prescription send to [facility] order.  Pt. To have fasting Lipid and Liver panel 3 months after start taken Lipitor  20 mg. An appointment was made for pt for labs on May 24 th. Patient aware.

## 2012-02-08 NOTE — Telephone Encounter (Signed)
Left patient a message to call back.

## 2012-03-07 ENCOUNTER — Other Ambulatory Visit: Payer: Self-pay | Admitting: Cardiovascular Disease

## 2012-03-07 MED ORDER — ATORVASTATIN CALCIUM 20 MG PO TABS: 20.0000 mg | ORAL_TABLET | Freq: Every day | ORAL | Status: DC

## 2012-03-07 NOTE — Telephone Encounter (Signed)
30 day til [facility] can send

## 2012-03-07 NOTE — Telephone Encounter (Signed)
Refill sent for 30 day supply for lipitor

## 2012-03-16 ENCOUNTER — Ambulatory Visit: Payer: Self-pay | Admitting: Family Medicine

## 2012-03-16 IMAGING — CR DG CHEST 2V
1 series · 2 of 2 positions shown · non-contrast
Comparison: none

REASON FOR EXAM: cough
COMMENTS:

[Series 1: pa · 0.17mm/px · 2 of 2 slices shown]
[im 1/2]
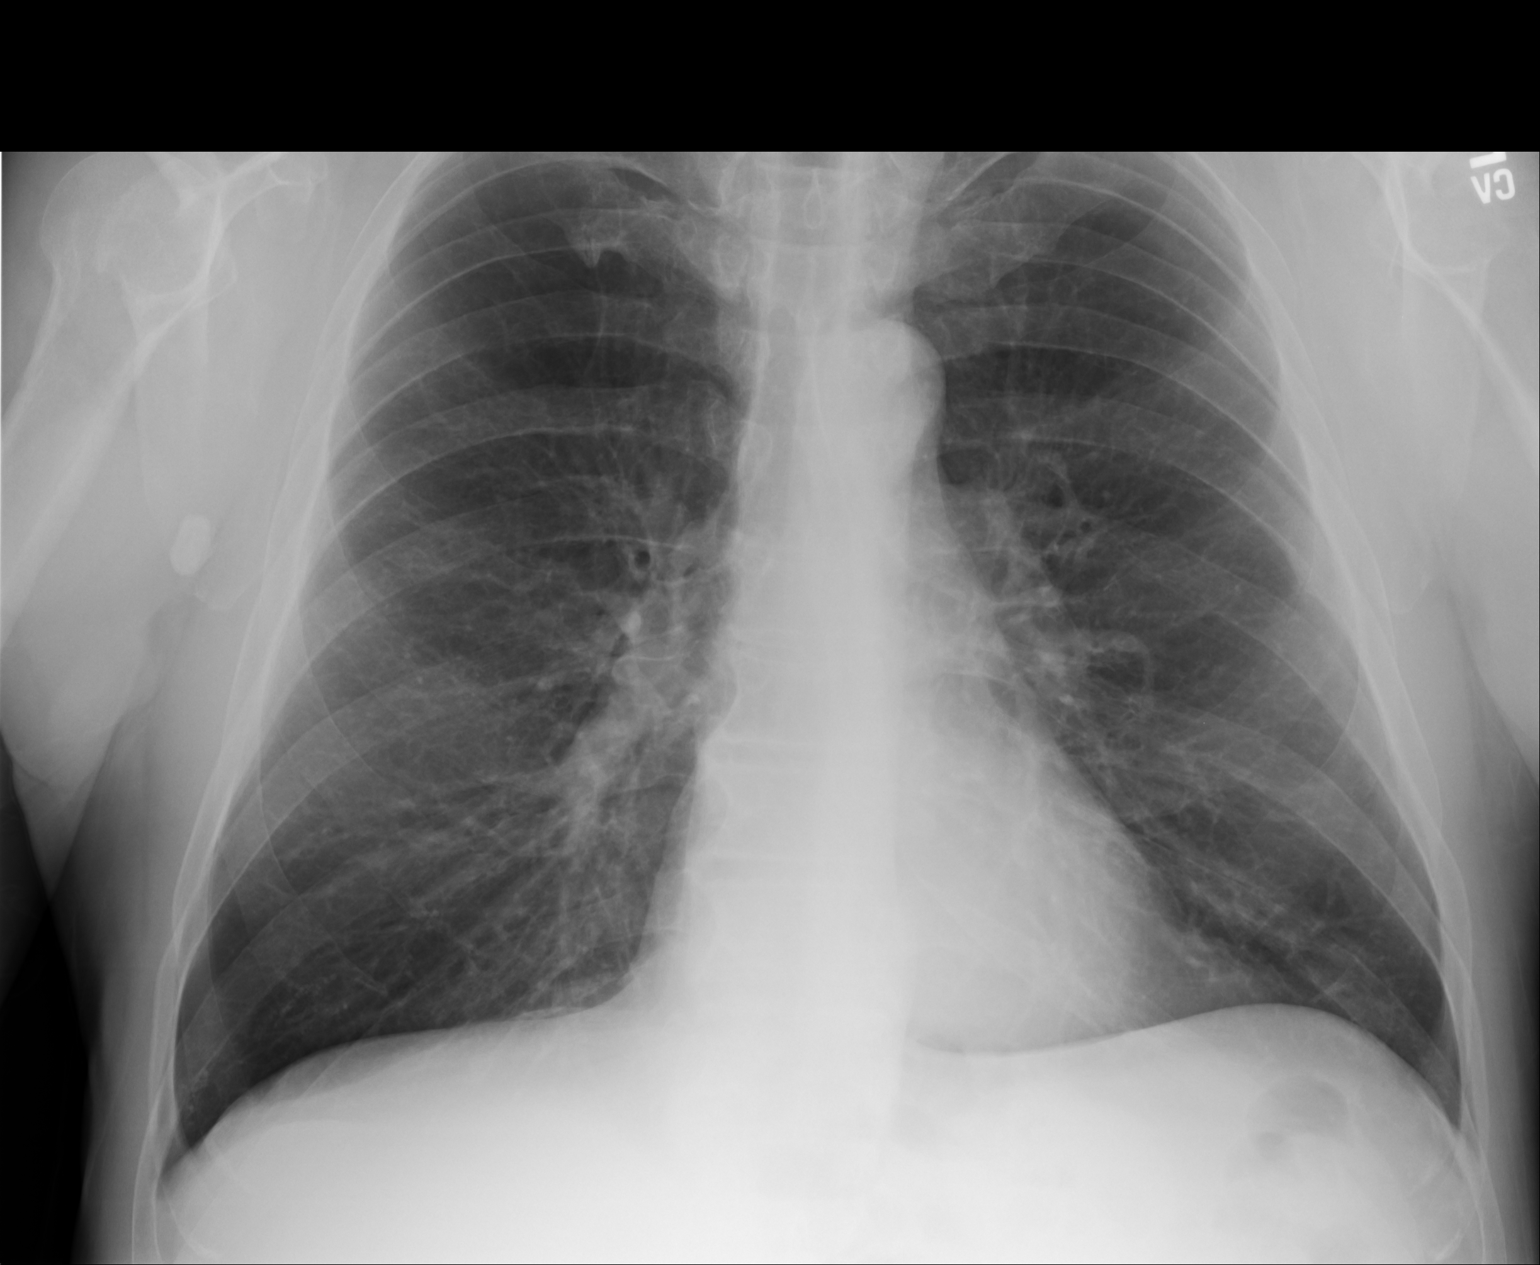
[im 2/2]
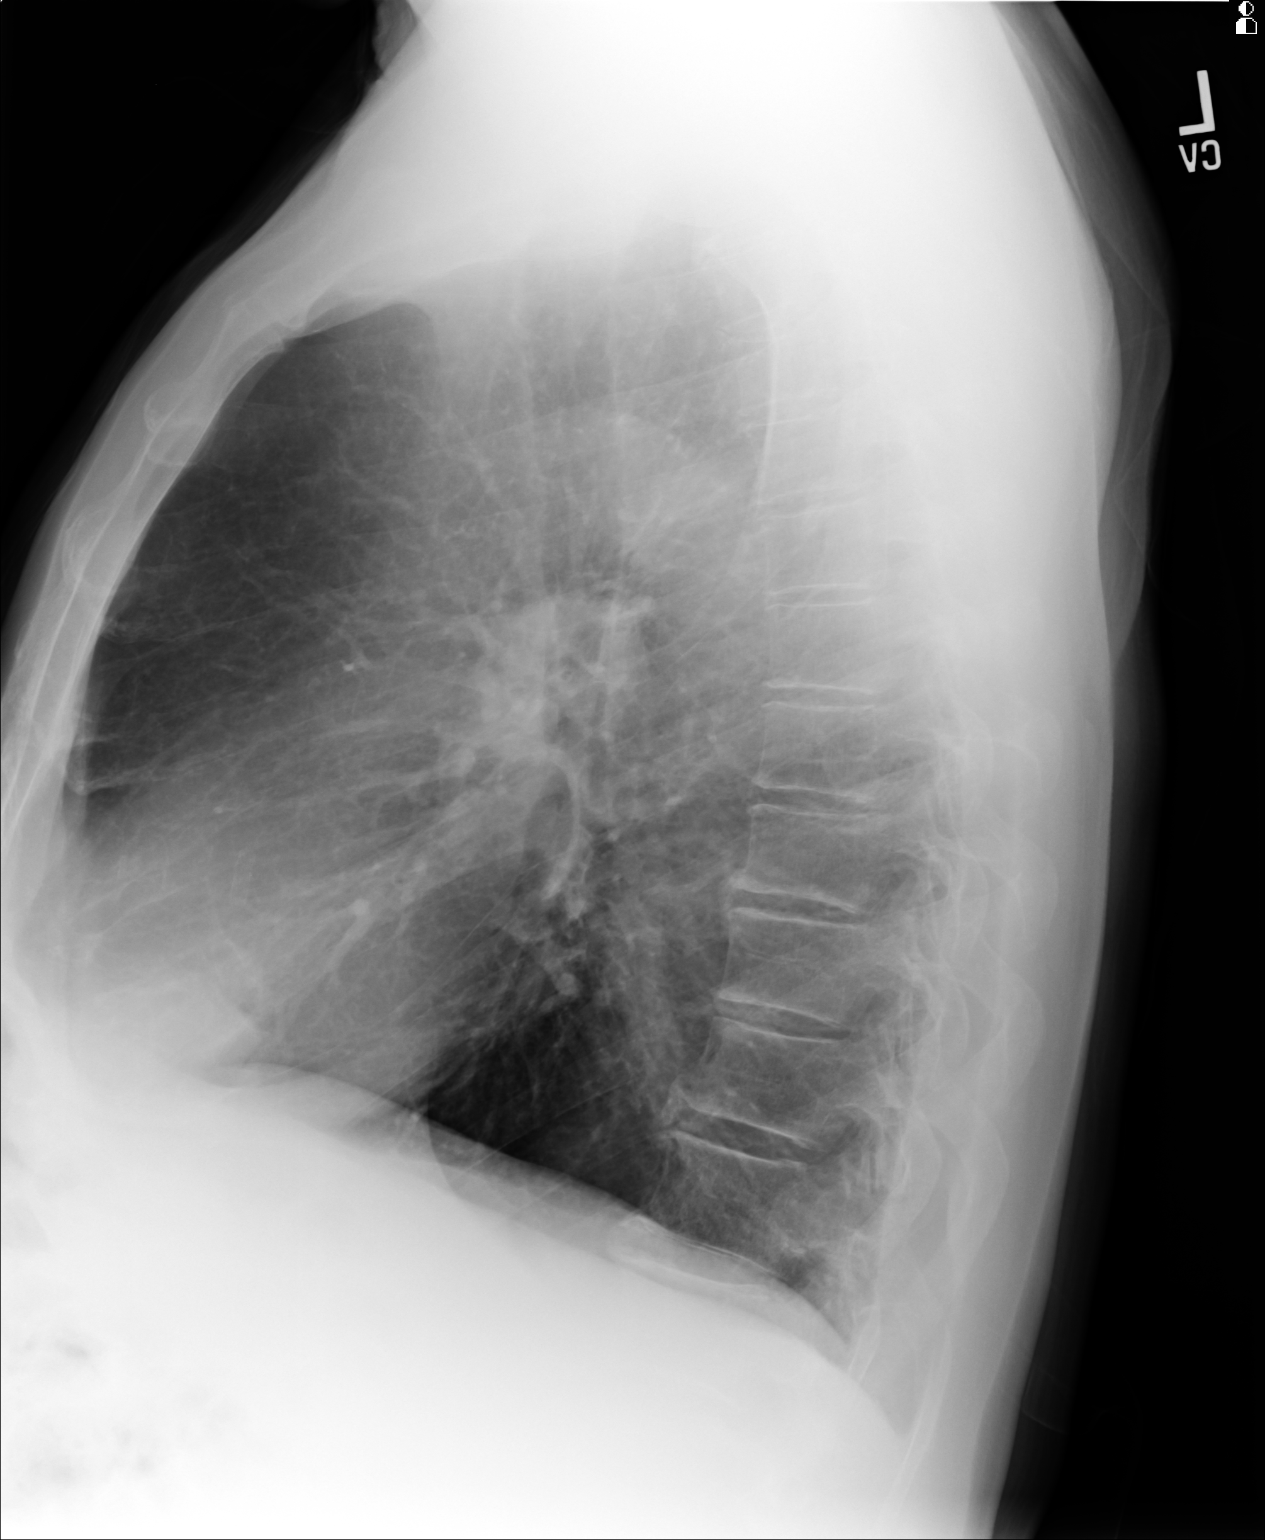

[2 of 2 positions shown; findings below may reference images not displayed]

PROCEDURE:     KDR - KDXR CHEST PA (OR AP) AND LAT  - [DATE]  [DATE]

RESULT:     Comparison is made to the prior exam of [DATE]. The lung
fields are clear. No pneumonia, pneumothorax or pleural effusion is seen.
Heart size is normal. The chest appears bilaterally hyperinflated compatible
with a history of COPD or asthma. There is again noted a calcified lymph
node at the right axilla.
IMPRESSION: 1. The lung fields are clear.
2. Heart size is normal.
3. The chest appears bilaterally hyperinflated compatible with a history of
COPD or asthma.

## 2012-04-14 ENCOUNTER — Ambulatory Visit (INDEPENDENT_AMBULATORY_CARE_PROVIDER_SITE_OTHER): Payer: BC Managed Care – PPO | Admitting: Cardiovascular Disease

## 2012-04-14 ENCOUNTER — Encounter: Payer: Self-pay | Admitting: Cardiovascular Disease

## 2012-04-14 DIAGNOSIS — I2510 Atherosclerotic heart disease of native coronary artery without angina pectoris: Secondary | ICD-10-CM

## 2012-04-14 DIAGNOSIS — I10 Essential (primary) hypertension: Secondary | ICD-10-CM

## 2012-04-14 DIAGNOSIS — R079 Chest pain, unspecified: Secondary | ICD-10-CM

## 2012-04-14 DIAGNOSIS — E785 Hyperlipidemia, unspecified: Secondary | ICD-10-CM

## 2012-04-14 DIAGNOSIS — I428 Other cardiomyopathies: Secondary | ICD-10-CM

## 2012-04-14 NOTE — Assessment & Plan Note (Signed)
I will see what his ejection fraction is. If his EF is low, he would need to be treated with an ACE inhibitor. It appears that he is no longer on a beta blocker due to bradycardia.

## 2012-04-14 NOTE — Patient Instructions (Signed)
Your physician has requested that you have en exercise stress myoview. For further information please visit [email or web address] Please follow instruction sheet, as given.  follow up in 6 months.

## 2012-04-14 NOTE — Progress Notes (Signed)
HPI  This is a 61 year old man who is here today for an urgent evaluation. He is well-known to me. In December of 2010, he presented with an acute anterior myocardial infarction with late presentation. Cardiac catheterization showed proximal LAD occlusion with 99% mid RCA stenosis. He underwent an angioplasty and drug-eluting stent placement to both LAD and RCA without complications. His ejection fraction was 35% but improved subsequently. He called the office today because his blood pressure was running high. The patient has not been feeling well over the last few days. He denies any chest pain but has noticed increased exertional dyspnea as well as increased fatigue. He went to the drug store and got his blood pressure checked. He was told that his systolic blood pressure was fine but his diastolic blood pressure was 110. He denies any headache. He does not have any other neurologic symptoms.   No Known Allergies   Current Outpatient Prescriptions on File Prior to Visit  Medication Sig Dispense Refill  . albuterol (VENTOLIN HFA) 108 (90 BASE) MCG/ACT inhaler Inhale 2 puffs into the lungs every 6 (six) hours as needed.        [name] aspirin 81 MG tablet Take 81 mg by mouth daily.        [name] atorvastatin (LIPITOR) 20 MG tablet Take 1 tablet (20 mg total) by mouth daily.  30 tablet  1  . citalopram (CELEXA) 40 MG tablet Take 40 mg by mouth daily.        . clopidogrel (PLAVIX) 75 MG tablet Take 1 tablet (75 mg total) by mouth daily.  90 tablet  3  . esomeprazole (NEXIUM) 40 MG capsule Take 40 mg by mouth daily before breakfast.        . Fluticasone-Salmeterol (ADVAIR DISKUS) 500-50 MCG/DOSE AEPB Inhale 1 puff into the lungs every 12 (twelve) hours.        [name] levalbuterol (XOPENEX) 0.63 MG/3ML nebulizer solution Take 1 ampule by nebulization every 4 (four) hours as needed.        . nitroGLYCERIN (NITROSTAT) 0.4 MG SL tablet Place 1 tablet (0.4 mg total) under the tongue every 5 (five) minutes as needed.   25 tablet  3  . tiotropium (SPIRIVA) 18 MCG inhalation capsule Place 18 mcg into inhaler and inhale daily.           Past Medical History  Diagnosis Date  . Hyperlipidemia   . COPD (chronic obstructive pulmonary disease)   . Depression   . GERD (gastroesophageal reflux disease)   . Colon polyps   . Status post dilation of esophageal narrowing 2000  . Peptic ulcer   . Coronary artery disease 11/2009    Anterior MI with late presentation 11/2009. Cath showed a 100% proximal LAD occlusion, 99% mid RCA. Had a PCI and 2 DES to both LAD and RCA. EF was 35%.      Past Surgical History  Procedure Date  . Penile prosthesis implant   . Esophagogastroduodenoscopy 2008  . Colonoscopy   . Esophageal dilation   . Cardiac catheterization   . Coronary angioplasty with stent placement 09/2009    LAD 3.0 X23 mm Xience DES, RCA: 4.0 X 15 mm Xience DES     Family History  Problem Relation Age of Onset  . Heart attack Brother      History   Social History  . Marital Status: Married    Spouse Name: N/A    Number of Children: N/A  . Years of Education:  N/A   Occupational History  . Not on file.   Social History Main Topics  . Smoking status: Current Everyday Smoker -- 1.5 packs/day for 45 years    Types: Cigarettes  . Smokeless tobacco: Not on file  . Alcohol Use: No  . Drug Use: No  . Sexually Active: Not on file   Other Topics Concern  . Not on file   Social History Narrative  . No narrative on file     PHYSICAL EXAM   BP 140/84  Pulse 82  Resp 18  Ht 5\' 10"  (1.778 m)  Wt 171 lb (77.565 kg)  BMI 24.54 kg/m2  Constitutional: He is oriented to person, place, and time. He appears well-developed and well-nourished. No distress.  HENT: No nasal discharge.  Head: Normocephalic and atraumatic.  Eyes: Pupils are equal and round. Right eye exhibits no discharge. Left eye exhibits no discharge.  Neck: Normal range of motion. Neck supple. No JVD present. No thyromegaly  present.  Cardiovascular: Normal rate, regular rhythm, normal heart sounds and. Exam reveals no gallop and no friction rub. No murmur heard.  Pulmonary/Chest: Effort normal and diminished breath sounds. No stridor. No respiratory distress. He has mild wheezes. He has no rales. He exhibits no tenderness.  Abdominal: Soft. Bowel sounds are normal. He exhibits no distension. There is no tenderness. There is no rebound and no guarding.  Musculoskeletal: Normal range of motion. He exhibits no edema and no tenderness.  Neurological: He is alert and oriented to person, place, and time. Coordination normal.  Skin: Skin is warm and dry. No rash noted. He is not diaphoretic. No erythema. No pallor.  Psychiatric: He has a normal mood and affect. His behavior is normal. Judgment and thought content normal.        ASSESSMENT AND PLAN

## 2012-04-14 NOTE — Assessment & Plan Note (Signed)
His blood pressure seems to be fine now. It's possible that the blood pressure reading at the drugstore was wrong. However, as mentioned above if his ejection fraction is low, he would benefit from an ACE inhibitor or ARB.

## 2012-04-14 NOTE — Assessment & Plan Note (Signed)
The patient is having increased fatigue and exertional dyspnea without chest pain. He has not had ischemic cardiac evaluation in about 2 years. I recommend proceeding with an exercise nuclear stress test for further evaluation. His baseline ECG is abnormal with evidence of prior anteroseptal infarct with minor ST changes. Thus, a treadmill stress test along with not be sufficient. I recommend continuing his same medications for now.

## 2012-04-14 NOTE — Assessment & Plan Note (Signed)
Lab Results  Component Value Date   CHOL 120 01/15/2010   HDL 37 01/15/2010   LDLCALC 70 01/15/2010   Continue treatment with atorvastatin.

## 2012-04-17 ENCOUNTER — Encounter: Payer: Self-pay | Admitting: *Deleted

## 2012-04-24 ENCOUNTER — Ambulatory Visit: Payer: Self-pay | Admitting: Cardiovascular Disease

## 2012-04-24 DIAGNOSIS — R079 Chest pain, unspecified: Secondary | ICD-10-CM

## 2012-04-28 ENCOUNTER — Other Ambulatory Visit: Payer: Self-pay | Admitting: Cardiovascular Disease

## 2012-05-01 ENCOUNTER — Telehealth: Payer: Self-pay

## 2012-05-01 MED ORDER — LISINOPRIL 10 MG PO TABS: 10.0000 mg | ORAL_TABLET | Freq: Every day | ORAL | Status: DC

## 2012-05-01 MED ORDER — METOPROLOL SUCCINATE ER 25 MG PO TB24: 25.0000 mg | ORAL_TABLET | Freq: Every day | ORAL | Status: DC

## 2012-05-01 NOTE — Telephone Encounter (Signed)
Notified patient need to send in lisinopril & Toprol to pharmacy per Dr. Kirke Corin.

## 2012-05-08 ENCOUNTER — Encounter: Payer: Self-pay | Admitting: Cardiovascular Disease

## 2012-05-08 ENCOUNTER — Ambulatory Visit (INDEPENDENT_AMBULATORY_CARE_PROVIDER_SITE_OTHER): Payer: BC Managed Care – PPO | Admitting: Cardiovascular Disease

## 2012-05-08 VITALS — BP 112/74 | HR 54 | Ht 70.0 in | Wt 170.0 lb

## 2012-05-08 DIAGNOSIS — I10 Essential (primary) hypertension: Secondary | ICD-10-CM

## 2012-05-08 DIAGNOSIS — I428 Other cardiomyopathies: Secondary | ICD-10-CM

## 2012-05-08 DIAGNOSIS — E785 Hyperlipidemia, unspecified: Secondary | ICD-10-CM

## 2012-05-08 DIAGNOSIS — I2510 Atherosclerotic heart disease of native coronary artery without angina pectoris: Secondary | ICD-10-CM

## 2012-05-08 DIAGNOSIS — R079 Chest pain, unspecified: Secondary | ICD-10-CM

## 2012-05-08 MED ORDER — NITROGLYCERIN 0.4 MG SL SUBL: 0.4000 mg | SUBLINGUAL_TABLET | SUBLINGUAL | Status: DC | PRN

## 2012-05-08 NOTE — Assessment & Plan Note (Signed)
Continue treatment with atorvastatin. Target LDL is less than 70. 

## 2012-05-08 NOTE — Patient Instructions (Signed)
Labs today.  Continue same medications.  Follow up in 6 months.  

## 2012-05-08 NOTE — Assessment & Plan Note (Signed)
His ejection fraction was 43%. Thus, there is an indication for an ACE inhibitor and a beta blocker. He was started on Toprol as well as lisinopril. He did have previous bradycardia on a beta blocker and thus will not try to increase the dose of Toprol. I will get basic metabolic profile today given that he was initiated on lisinopril.

## 2012-05-08 NOTE — Progress Notes (Signed)
HPI  This is a 61 year old male who is here today for a followup visit. He was seen recently for elevated blood pressure and dyspnea. He underwent a treadmill nuclear stress test which showed evidence of prior anterior and inferior infarcts without significant ischemia. Ejection fraction was 43%. He was hypertensive at baseline and had a hypertensive response to exercise with systolic blood pressure greater than 200 with 2 mm ST depression with exercise. Since then, I started him on Toprol 25 mg once daily and lisinopril. The patient today reports significant improvement in his symptoms. He has not had any chest pain. His dyspnea has improved.  No Known Allergies   Current Outpatient Prescriptions on File Prior to Visit  Medication Sig Dispense Refill  . albuterol (VENTOLIN HFA) 108 (90 BASE) MCG/ACT inhaler Inhale 2 puffs into the lungs every 6 (six) hours as needed.        [name] amoxicillin-clarithromycin-lansoprazole (PREVPAC) combo pack Takes 30 mg twice a day.      [name] aspirin 81 MG tablet Take 81 mg by mouth daily.        [name] atorvastatin (LIPITOR) 20 MG tablet Take 1 tablet (20 mg total) by mouth daily.  30 tablet  1  . clopidogrel (PLAVIX) 75 MG tablet Take 1 tablet (75 mg total) by mouth daily.  90 tablet  3  . esomeprazole (NEXIUM) 40 MG capsule Take 40 mg by mouth daily before breakfast.        . fluticasone (FLONASE) 50 MCG/ACT nasal spray Place 2 sprays into the nose daily.      . Fluticasone-Salmeterol (ADVAIR DISKUS) 500-50 MCG/DOSE AEPB Inhale 1 puff into the lungs every 12 (twelve) hours.        [name] levalbuterol (XOPENEX) 0.63 MG/3ML nebulizer solution Take 1 ampule by nebulization every 4 (four) hours as needed.        [name] lisinopril (PRINIVIL,ZESTRIL) 10 MG tablet Take 1 tablet (10 mg total) by mouth daily.  30 tablet  2  . loratadine (CLARITIN) 10 MG tablet Take 10 mg by mouth daily.      . metoprolol succinate (TOPROL XL) 25 MG 24 hr tablet Take 1 tablet (25 mg total) by mouth  daily.  30 tablet  2  . sertraline (ZOLOFT) 50 MG tablet 50 mg. Takes 3 tablets daily.      . sucralfate (CARAFATE) 1 G tablet Take 1 g by mouth as needed.      . tiotropium (SPIRIVA) 18 MCG inhalation capsule Place 18 mcg into inhaler and inhale daily.        [name] DISCONTD: nitroGLYCERIN (NITROSTAT) 0.4 MG SL tablet Place 1 tablet (0.4 mg total) under the tongue every 5 (five) minutes as needed.  25 tablet  3     Past Medical History  Diagnosis Date  . Hyperlipidemia   . COPD (chronic obstructive pulmonary disease)   . Depression   . GERD (gastroesophageal reflux disease)   . Colon polyps   . Status post dilation of esophageal narrowing 2000  . Peptic ulcer   . Coronary artery disease 11/2009    Anterior MI with late presentation 11/2009. Cath showed a 100% proximal LAD occlusion, 99% mid RCA. Had a PCI and 2 DES to both LAD and RCA. EF was 35%. Nuclear stress test 05/13: prior anterior/inferior infarcts without ischemic, EF 43%  . Hypertension      Past Surgical History  Procedure Date  . Penile prosthesis implant   . Esophagogastroduodenoscopy 2008  . Colonoscopy   .  Esophageal dilation   . Cardiac catheterization   . Coronary angioplasty with stent placement 09/2009    LAD 3.0 X23 mm Xience DES, RCA: 4.0 X 15 mm Xience DES     Family History  Problem Relation Age of Onset  . Heart attack Brother      History   Social History  . Marital Status: Married    Spouse Name: N/A    Number of Children: N/A  . Years of Education: N/A   Occupational History  . Not on file.   Social History Main Topics  . Smoking status: Current Everyday Smoker -- 1.5 packs/day for 45 years    Types: Cigarettes  . Smokeless tobacco: Not on file  . Alcohol Use: No  . Drug Use: No  . Sexually Active: Not on file   Other Topics Concern  . Not on file   Social History Narrative  . No narrative on file     PHYSICAL EXAM   BP 112/74  Pulse 54  Ht 5\' 10"  (1.778 m)  Wt 170 lb  (77.111 kg)  BMI 24.39 kg/m2  Constitutional: He is oriented to person, place, and time. He appears well-developed and well-nourished. No distress.  HENT: No nasal discharge.  Head: Normocephalic and atraumatic.  Eyes: Pupils are equal and round. Right eye exhibits no discharge. Left eye exhibits no discharge.  Neck: Normal range of motion. Neck supple. No JVD present. No thyromegaly present.  Cardiovascular: Normal rate, regular rhythm, normal heart sounds and. Exam reveals no gallop and no friction rub. No murmur heard.  Pulmonary/Chest: Effort normal and diminished breath sounds. No stridor. No respiratory distress. He has no wheezes. He has no rales. He exhibits no tenderness.  Abdominal: Soft. Bowel sounds are normal. He exhibits no distension. There is no tenderness. There is no rebound and no guarding.  Musculoskeletal: Normal range of motion. He exhibits no edema and no tenderness.  Neurological: He is alert and oriented to person, place, and time. Coordination normal.  Skin: Skin is warm and dry. No rash noted. He is not diaphoretic. No erythema. No pallor.  Psychiatric: He has a normal mood and affect. His behavior is normal. Judgment and thought content normal.      EKG: Sinus  Bradycardia ,-Right axis -consider right ventricular hypertrophy.  -Old anterior infarct.  -  Nonspecific T-abnormality.    ASSESSMENT AND PLAN

## 2012-05-08 NOTE — Assessment & Plan Note (Signed)
His recent stress test showed no evidence of ischemia. He is not having anginal chest pain at this time. I recommend continuing medical therapy.

## 2012-05-08 NOTE — Assessment & Plan Note (Signed)
His blood pressure is now well controlled. It's likely beneficial to keep his blood pressure on the low side due to his cardiomyopathy.

## 2012-05-09 ENCOUNTER — Telehealth: Payer: Self-pay | Admitting: *Deleted

## 2012-05-09 LAB — BASIC METABOLIC PANEL
BUN/Creatinine Ratio: 15 (ref 9–20)
BUN: 12 mg/dL (ref 6–24)
CO2: 23 mmol/L (ref 20–32)
Calcium: 9.2 mg/dL (ref 8.7–10.2)
Chloride: 102 mmol/L (ref 97–108)
Creatinine, Ser: 0.81 mg/dL (ref 0.76–1.27)
GFR calc Af Amer: 113 mL/min/{1.73_m2} (ref >59)
GFR calc non Af Amer: 98 mL/min/{1.73_m2} (ref >59)
Glucose: 108 mg/dL — ABNORMAL HIGH (ref 65–99)
Potassium: 4 mmol/L (ref 3.5–5.2)
Sodium: 138 mmol/L (ref 134–144)

## 2012-05-09 NOTE — Telephone Encounter (Signed)
Message copied by Burnell Blanks on Tue May 09, 2012  9:35 AM ------      Message from: Lorine Bears A      Created: Tue May 09, 2012  8:16 AM       Inform patient that labs were normal.

## 2012-05-09 NOTE — Telephone Encounter (Signed)
Advised of labs 

## 2012-05-12 ENCOUNTER — Other Ambulatory Visit: Payer: BC Managed Care – PPO

## 2012-05-16 ENCOUNTER — Telehealth: Payer: Self-pay | Admitting: Cardiovascular Disease

## 2012-05-16 ENCOUNTER — Other Ambulatory Visit: Payer: Self-pay

## 2012-05-16 DIAGNOSIS — R001 Bradycardia, unspecified: Secondary | ICD-10-CM

## 2012-05-16 DIAGNOSIS — R29898 Other symptoms and signs involving the musculoskeletal system: Secondary | ICD-10-CM

## 2012-05-16 NOTE — Telephone Encounter (Signed)
Pt called back. I explained plan to him. He verb. Understanding and was transferred to FD to schedule appts.

## 2012-05-16 NOTE — Telephone Encounter (Signed)
Pt calling with question about his lisinopril and Metoprolol. Pt states that he is having a lot of GI issues with it with SOB and muscle weakness.

## 2012-05-16 NOTE — Telephone Encounter (Signed)
LMTCB

## 2012-05-16 NOTE — Telephone Encounter (Signed)
Pt states since last OV/med changes (Toprol and lisinopril), he has noticed frequent abdominal cramping (no diarrhea), muscle weakness and worsening DOE. He feels these symptoms are all r/t the lisinopril and Toprol.  He says he is usually able to walk a good distance but lately legs become so fatigued that he has to stop.  Says they tend to feel "like rubber".    I will send message to Dr. Kirke Corin and call pt back with response.

## 2012-05-16 NOTE — Telephone Encounter (Signed)
Hold Toprol and Lisniopril for few days and see if symptoms improve.  The leg fatigue might be related to PVD. Schedule him for ABI/LE arterial doppler and then follow up with me after that.

## 2012-05-25 ENCOUNTER — Encounter (INDEPENDENT_AMBULATORY_CARE_PROVIDER_SITE_OTHER): Payer: BC Managed Care – PPO

## 2012-05-25 DIAGNOSIS — I739 Peripheral vascular disease, unspecified: Secondary | ICD-10-CM

## 2012-05-25 DIAGNOSIS — R001 Bradycardia, unspecified: Secondary | ICD-10-CM

## 2012-05-25 DIAGNOSIS — R29898 Other symptoms and signs involving the musculoskeletal system: Secondary | ICD-10-CM

## 2012-05-29 ENCOUNTER — Ambulatory Visit (INDEPENDENT_AMBULATORY_CARE_PROVIDER_SITE_OTHER): Payer: BC Managed Care – PPO | Admitting: Cardiovascular Disease

## 2012-05-29 ENCOUNTER — Encounter: Payer: Self-pay | Admitting: Cardiovascular Disease

## 2012-05-29 VITALS — BP 120/80 | HR 65 | Ht 70.0 in | Wt 169.0 lb

## 2012-05-29 DIAGNOSIS — M79609 Pain in unspecified limb: Secondary | ICD-10-CM

## 2012-05-29 DIAGNOSIS — F17200 Nicotine dependence, unspecified, uncomplicated: Secondary | ICD-10-CM

## 2012-05-29 DIAGNOSIS — I428 Other cardiomyopathies: Secondary | ICD-10-CM

## 2012-05-29 DIAGNOSIS — M79606 Pain in leg, unspecified: Secondary | ICD-10-CM | POA: Insufficient documentation

## 2012-05-29 DIAGNOSIS — I10 Essential (primary) hypertension: Secondary | ICD-10-CM

## 2012-05-29 DIAGNOSIS — I2510 Atherosclerotic heart disease of native coronary artery without angina pectoris: Secondary | ICD-10-CM

## 2012-05-29 NOTE — Patient Instructions (Signed)
Continue same medications.  We will call you to let you know the results of the circulation test.  Follow up in 6 months.

## 2012-05-29 NOTE — Progress Notes (Signed)
HPI  This is a 61 year old male who is here today for a followup visit. He was seen recently for elevated blood pressure and dyspnea. He underwent a treadmill nuclear stress test which showed evidence of prior anterior and inferior infarcts without significant ischemia. Ejection fraction was 43%. He was hypertensive at baseline and had a hypertensive response to exercise with systolic blood pressure greater than 200 with 2 mm ST depression with exercise. He was started on small dose Toprol and lisinopril. He called [place] back about a week later complaining of dizziness, fatigue and lower extremity discomfort every time he took the 2 medications. Both medications were stopped and his symptoms improved. An ABI was requested to evaluate for possible underlying peripheral arterial disease. He is currently feeling better. No chest pain. His dyspnea is stable. He is still smoking one and half pack per day .     No Known Allergies   Current Outpatient Prescriptions on File Prior to Visit  Medication Sig Dispense Refill  . albuterol (VENTOLIN HFA) 108 (90 BASE) MCG/ACT inhaler Inhale 2 puffs into the lungs every 6 (six) hours as needed.        [name] aspirin 81 MG tablet Take 81 mg by mouth daily.        [name] atorvastatin (LIPITOR) 20 MG tablet Take 1 tablet (20 mg total) by mouth daily.  30 tablet  1  . clopidogrel (PLAVIX) 75 MG tablet Take 1 tablet (75 mg total) by mouth daily.  90 tablet  3  . esomeprazole (NEXIUM) 40 MG capsule Take 40 mg by mouth daily before breakfast.        . fluticasone (FLONASE) 50 MCG/ACT nasal spray Place 2 sprays into the nose daily.      . Fluticasone-Salmeterol (ADVAIR DISKUS) 500-50 MCG/DOSE AEPB Inhale 1 puff into the lungs every 12 (twelve) hours.        [name] levalbuterol (XOPENEX) 0.63 MG/3ML nebulizer solution Take 1 ampule by nebulization every 4 (four) hours as needed.        [name] LORazepam (ATIVAN) 0.5 MG tablet Take 0.5 mg by mouth at bedtime.      . nitroGLYCERIN  (NITROSTAT) 0.4 MG SL tablet Place 1 tablet (0.4 mg total) under the tongue every 5 (five) minutes as needed.  25 tablet  3  . sertraline (ZOLOFT) 50 MG tablet 50 mg. Takes 3 tablets daily.      . sucralfate (CARAFATE) 1 G tablet Take 1 g by mouth as needed.      . tiotropium (SPIRIVA) 18 MCG inhalation capsule Place 18 mcg into inhaler and inhale daily.           Past Medical History  Diagnosis Date  . Hyperlipidemia   . COPD (chronic obstructive pulmonary disease)   . Depression   . GERD (gastroesophageal reflux disease)   . Colon polyps   . Status post dilation of esophageal narrowing 2000  . Peptic ulcer   . Coronary artery disease 11/2009    Anterior MI with late presentation 11/2009. Cath showed a 100% proximal LAD occlusion, 99% mid RCA. Had a PCI and 2 DES to both LAD and RCA. EF was 35%. Nuclear stress test 05/13: prior anterior/inferior infarcts without ischemic, EF 43%  . Hypertension   . CARDIOMYOPATHY 03/24/2010    did not tolerate Toprol and Lisinopril.      Past Surgical History  Procedure Date  . Penile prosthesis implant   . Esophagogastroduodenoscopy 2008  . Colonoscopy   .  Esophageal dilation   . Cardiac catheterization   . Coronary angioplasty with stent placement 09/2009    LAD 3.0 X23 mm Xience DES, RCA: 4.0 X 15 mm Xience DES     Family History  Problem Relation Age of Onset  . Heart attack Brother      History   Social History  . Marital Status: Married    Spouse Name: N/A    Number of Children: N/A  . Years of Education: N/A   Occupational History  . Not on file.   Social History Main Topics  . Smoking status: Current Everyday Smoker -- 1.5 packs/day for 45 years    Types: Cigarettes  . Smokeless tobacco: Not on file  . Alcohol Use: No  . Drug Use: No  . Sexually Active: Not on file   Other Topics Concern  . Not on file   Social History Narrative  . No narrative on file      PHYSICAL EXAM   BP 120/80  Pulse 65  Ht 5'  10" (1.778 m)  Wt 169 lb (76.658 kg)  BMI 24.25 kg/m2 Constitutional: He is oriented to person, place, and time. He appears well-developed and well-nourished. No distress.  HENT: No nasal discharge.  Head: Normocephalic and atraumatic.  Eyes: Pupils are equal and round. Right eye exhibits no discharge. Left eye exhibits no discharge.  Neck: Normal range of motion. Neck supple. No JVD present. No thyromegaly present.  Cardiovascular: Normal rate, regular rhythm, normal heart sounds and. Exam reveals no gallop and no friction rub. No murmur heard.  Pulmonary/Chest: Effort normal and diminished breath sounds. No stridor. No respiratory distress. He has no wheezes. He has no rales. He exhibits no tenderness.  Abdominal: Soft. Bowel sounds are normal. He exhibits no distension. There is no tenderness. There is no rebound and no guarding.  Musculoskeletal: Normal range of motion. He exhibits no edema and no tenderness.  Neurological: He is alert and oriented to person, place, and time. Coordination normal.  Skin: Skin is warm and dry. No rash noted. He is not diaphoretic. No erythema. No pallor.  Psychiatric: He has a normal mood and affect. His behavior is normal. Judgment and thought content normal.       EKG: Sinus  Rhythm  -Poor R-wave progression -nonspecific -consider old anterior infarct.   -  Nonspecific T-abnormality.    ASSESSMENT AND PLAN

## 2012-05-29 NOTE — Assessment & Plan Note (Signed)
No symptoms suggestive of angina. Continue medical therapy. 

## 2012-05-29 NOTE — Assessment & Plan Note (Signed)
His blood pressure is currently normal off medications.

## 2012-05-29 NOTE — Assessment & Plan Note (Signed)
I again discussed with him the importance of smoking cessation. He does not seem to be able to quit at this time.

## 2012-05-29 NOTE — Assessment & Plan Note (Signed)
This might represent atypical claudication. He is obviously at risk for PAD given his prolonged history of tobacco use. By physical exam, his pulse is seems to be well preserved. He is going to have an ABI done later this week.

## 2012-05-29 NOTE — Assessment & Plan Note (Signed)
He appears to be euvolemic. Unfortunately, he did not tolerate small dose Toprol and lisinopril.

## 2012-06-06 ENCOUNTER — Telehealth: Payer: Self-pay | Admitting: *Deleted

## 2012-06-06 NOTE — Telephone Encounter (Signed)
LMOM

## 2012-06-07 ENCOUNTER — Telehealth: Payer: Self-pay

## 2012-06-07 NOTE — Telephone Encounter (Signed)
Pt called wanting ABI results. These were normal. Results given to pt.

## 2012-06-08 ENCOUNTER — Ambulatory Visit: Payer: Self-pay | Admitting: Family Medicine

## 2012-06-08 IMAGING — CR DG CHEST 2V
1 series · 3 of 3 positions shown · non-contrast
Comparison: none

REASON FOR EXAM: cough
COMMENTS:

[Series 1: pa · 0.17mm/px · 3 of 3 slices shown]
[im 1/3]
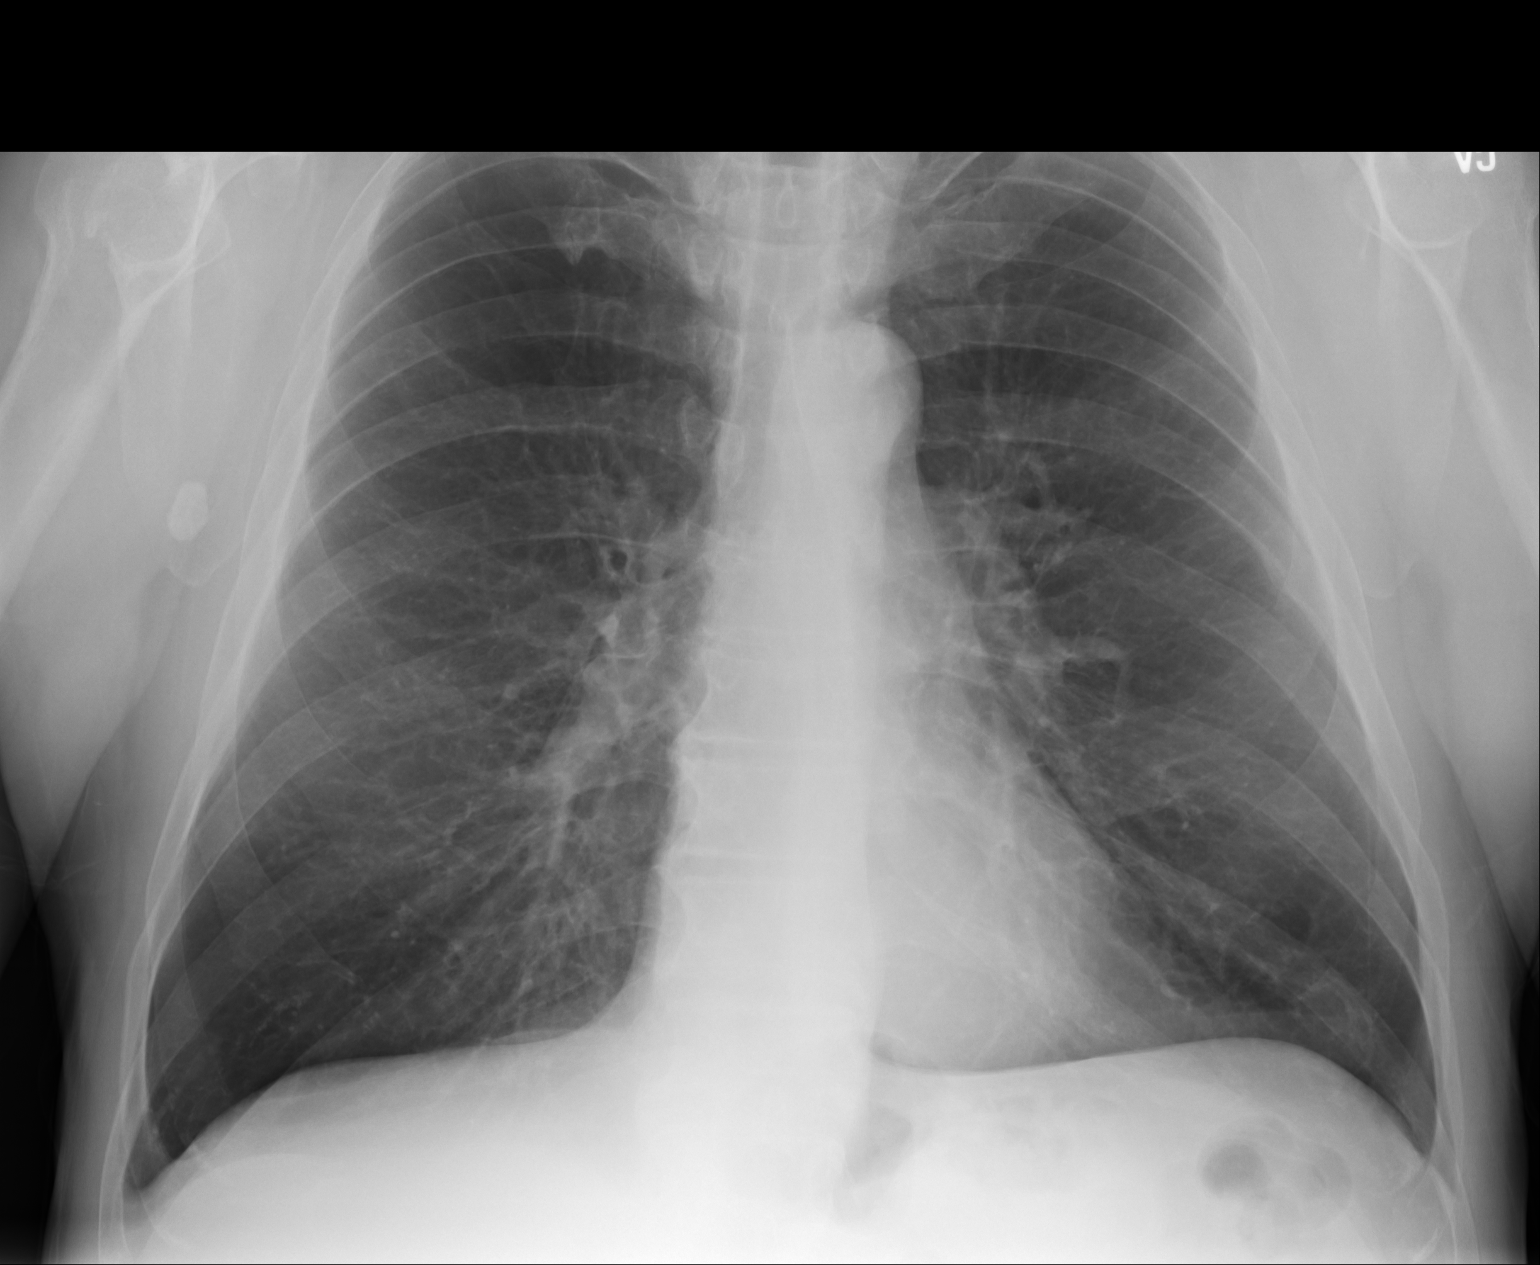
[im 2/3]
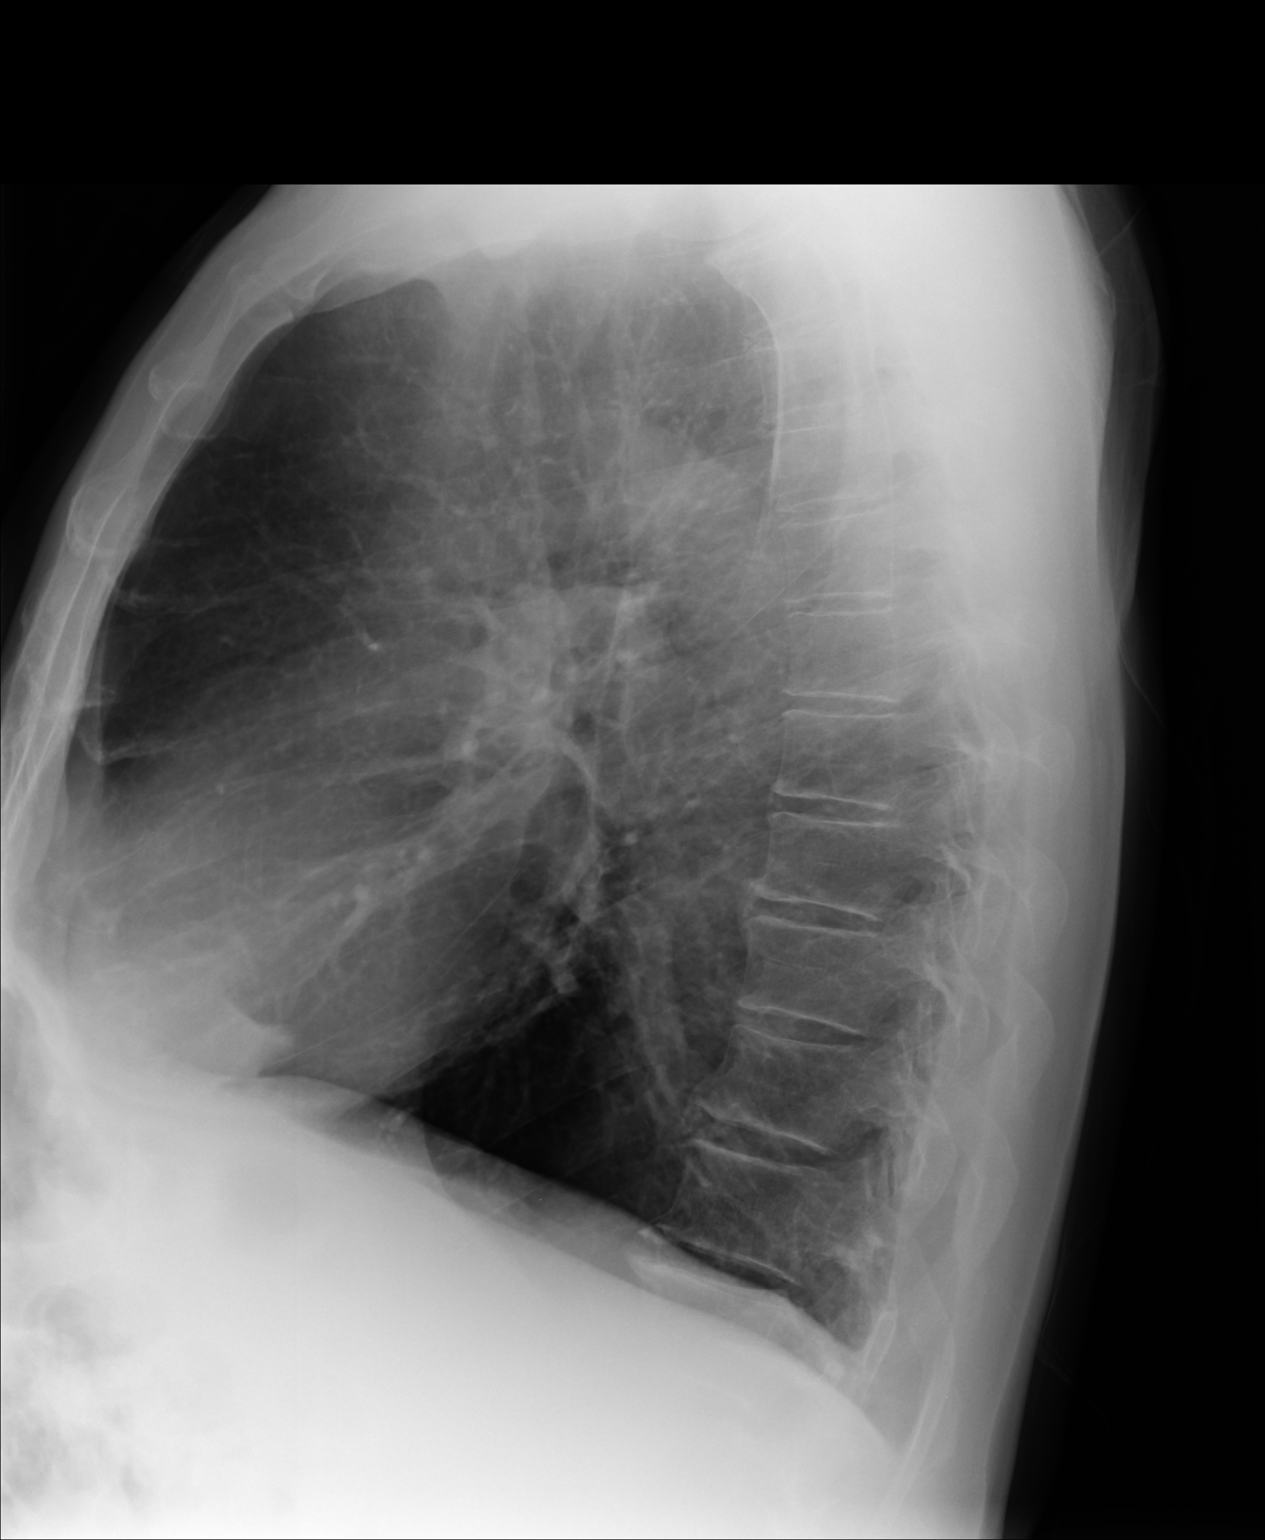
[im 3/3]
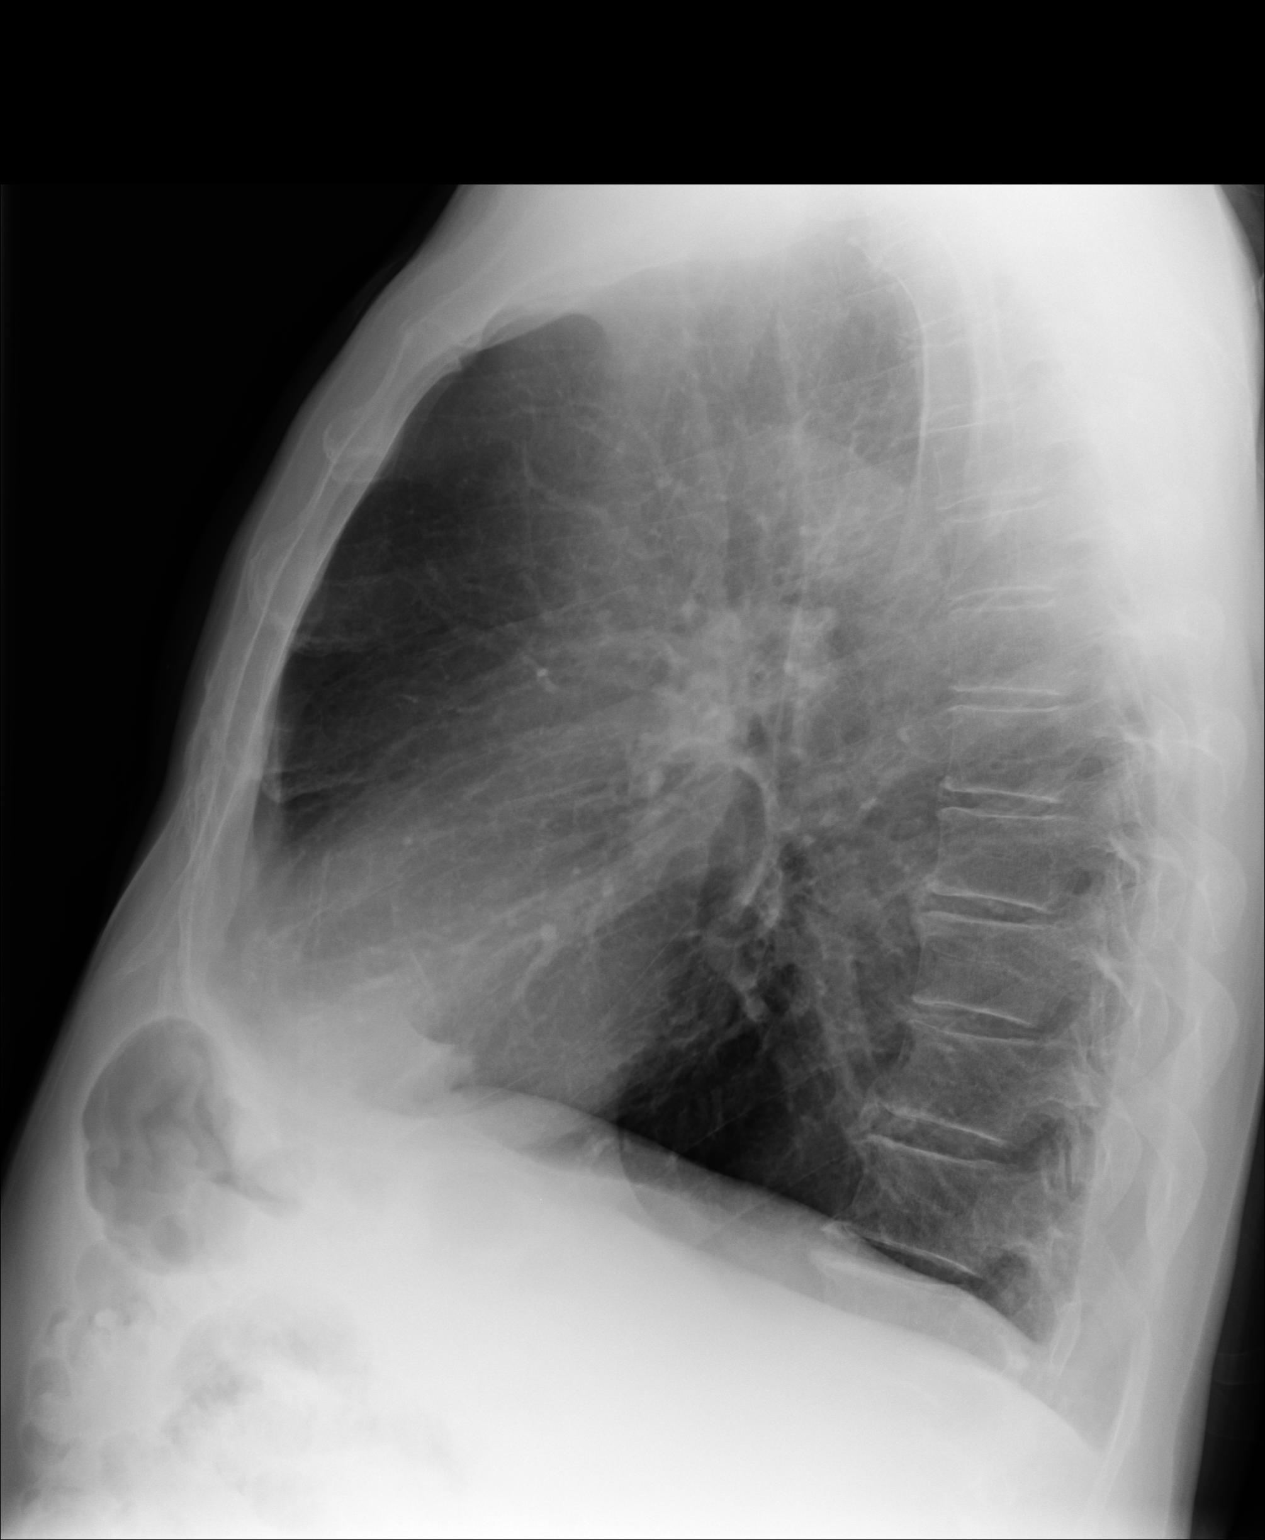

[3 of 3 positions shown; findings below may reference images not displayed]

PROCEDURE:     KDR - KDXR CHEST PA (OR AP) AND LAT  - [DATE] [DATE]

RESULT:     Comparison is made to the prior exam of [DATE]. No
significant interval changes are identified. No pneumonia is seen. No
pneumothorax or pleural effusion is identified. Heart size is normal. No
acute bony abnormalities are identified. The lungs are bilaterally
hyperinflated consistent with a history of COPD.
IMPRESSION: 1. The lungs are hyperinflated consistent with a history of COPD.
2. The lung fields are clear.
3. Heart size is normal.

[REDACTED]

## 2012-06-18 ENCOUNTER — Other Ambulatory Visit: Payer: Self-pay

## 2012-06-18 ENCOUNTER — Emergency Department (HOSPITAL_COMMUNITY)
Admission: EM | Admit: 2012-06-18 | Discharge: 2012-06-18 | Disposition: A | Discharge status: Home / Self Care | Payer: BC Managed Care – PPO | Attending: Emergency Medicine | Admitting: Emergency Medicine

## 2012-06-18 ENCOUNTER — Encounter (HOSPITAL_COMMUNITY): Payer: Self-pay | Admitting: Emergency Medicine

## 2012-06-18 ENCOUNTER — Emergency Department (HOSPITAL_COMMUNITY): Payer: BC Managed Care – PPO

## 2012-06-18 DIAGNOSIS — Z79899 Other long term (current) drug therapy: Secondary | ICD-10-CM | POA: Insufficient documentation

## 2012-06-18 DIAGNOSIS — J4489 Other specified chronic obstructive pulmonary disease: Secondary | ICD-10-CM | POA: Insufficient documentation

## 2012-06-18 DIAGNOSIS — I428 Other cardiomyopathies: Secondary | ICD-10-CM | POA: Insufficient documentation

## 2012-06-18 DIAGNOSIS — F17200 Nicotine dependence, unspecified, uncomplicated: Secondary | ICD-10-CM | POA: Insufficient documentation

## 2012-06-18 DIAGNOSIS — R0602 Shortness of breath: Secondary | ICD-10-CM

## 2012-06-18 DIAGNOSIS — F329 Major depressive disorder, single episode, unspecified: Secondary | ICD-10-CM | POA: Insufficient documentation

## 2012-06-18 DIAGNOSIS — I2510 Atherosclerotic heart disease of native coronary artery without angina pectoris: Secondary | ICD-10-CM | POA: Insufficient documentation

## 2012-06-18 DIAGNOSIS — E785 Hyperlipidemia, unspecified: Secondary | ICD-10-CM | POA: Insufficient documentation

## 2012-06-18 DIAGNOSIS — J449 Chronic obstructive pulmonary disease, unspecified: Secondary | ICD-10-CM

## 2012-06-18 DIAGNOSIS — I10 Essential (primary) hypertension: Secondary | ICD-10-CM | POA: Insufficient documentation

## 2012-06-18 DIAGNOSIS — K219 Gastro-esophageal reflux disease without esophagitis: Secondary | ICD-10-CM | POA: Insufficient documentation

## 2012-06-18 DIAGNOSIS — Z7982 Long term (current) use of aspirin: Secondary | ICD-10-CM | POA: Insufficient documentation

## 2012-06-18 DIAGNOSIS — F3289 Other specified depressive episodes: Secondary | ICD-10-CM | POA: Insufficient documentation

## 2012-06-18 LAB — CBC
Platelets: 224 10*3/uL (ref 150–400)
RDW: 13.2 % (ref 11.5–15.5)
WBC: 10.5 10*3/uL (ref 4.0–10.5)

## 2012-06-18 LAB — BASIC METABOLIC PANEL
Calcium: 9.5 mg/dL (ref 8.4–10.5)
Creatinine, Ser: 0.73 mg/dL (ref 0.50–1.35)
GFR calc Af Amer: >90 mL/min (ref >90)
GFR calc non Af Amer: >90 mL/min (ref >90)

## 2012-06-18 LAB — POCT I-STAT TROPONIN I

## 2012-06-18 IMAGING — CR DG CHEST 2V
2 series · 2 of 2 positions shown · non-contrast
Comparison: None

CLINICAL DATA: Shortness of breath and chest pain.

CHEST - 2 VIEW

[w chest pa]
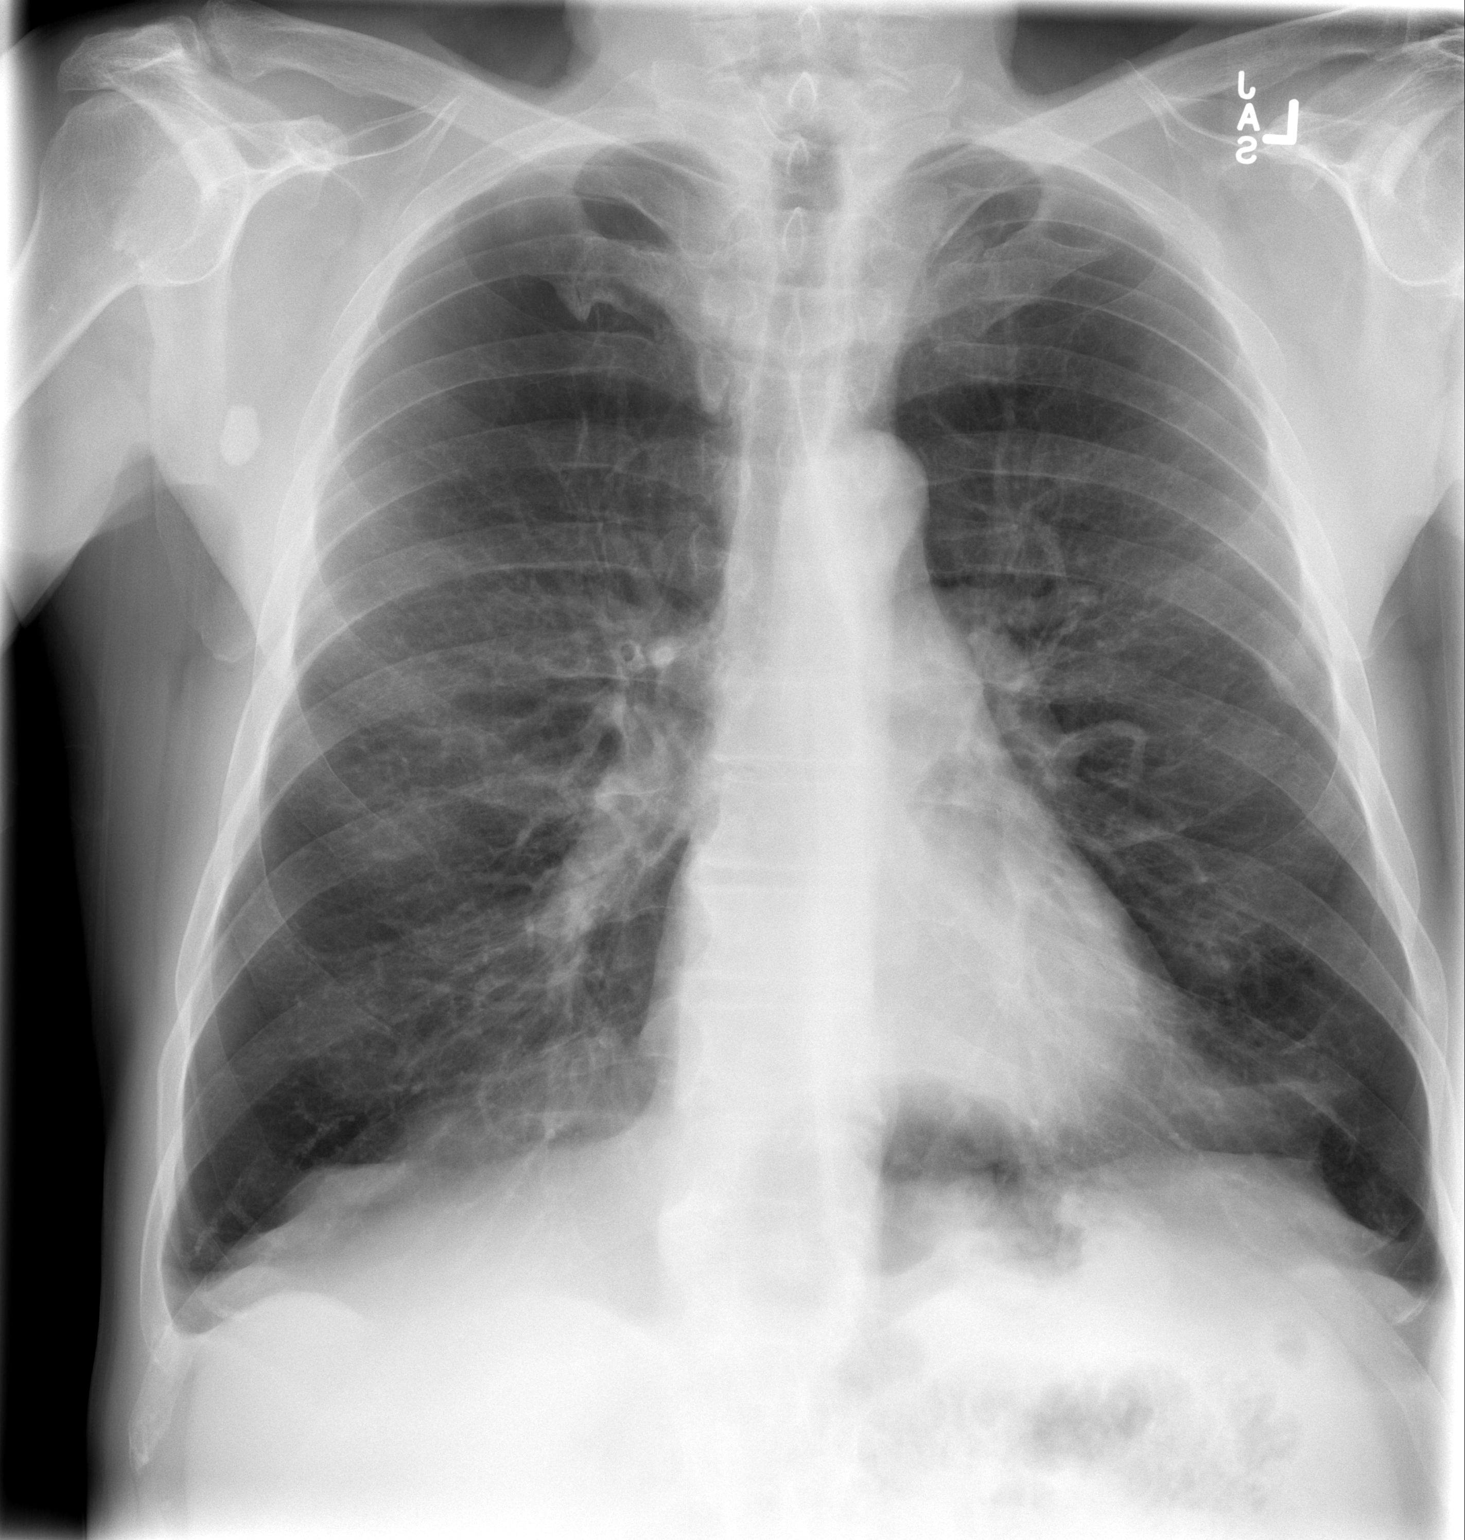

[w chest lat]
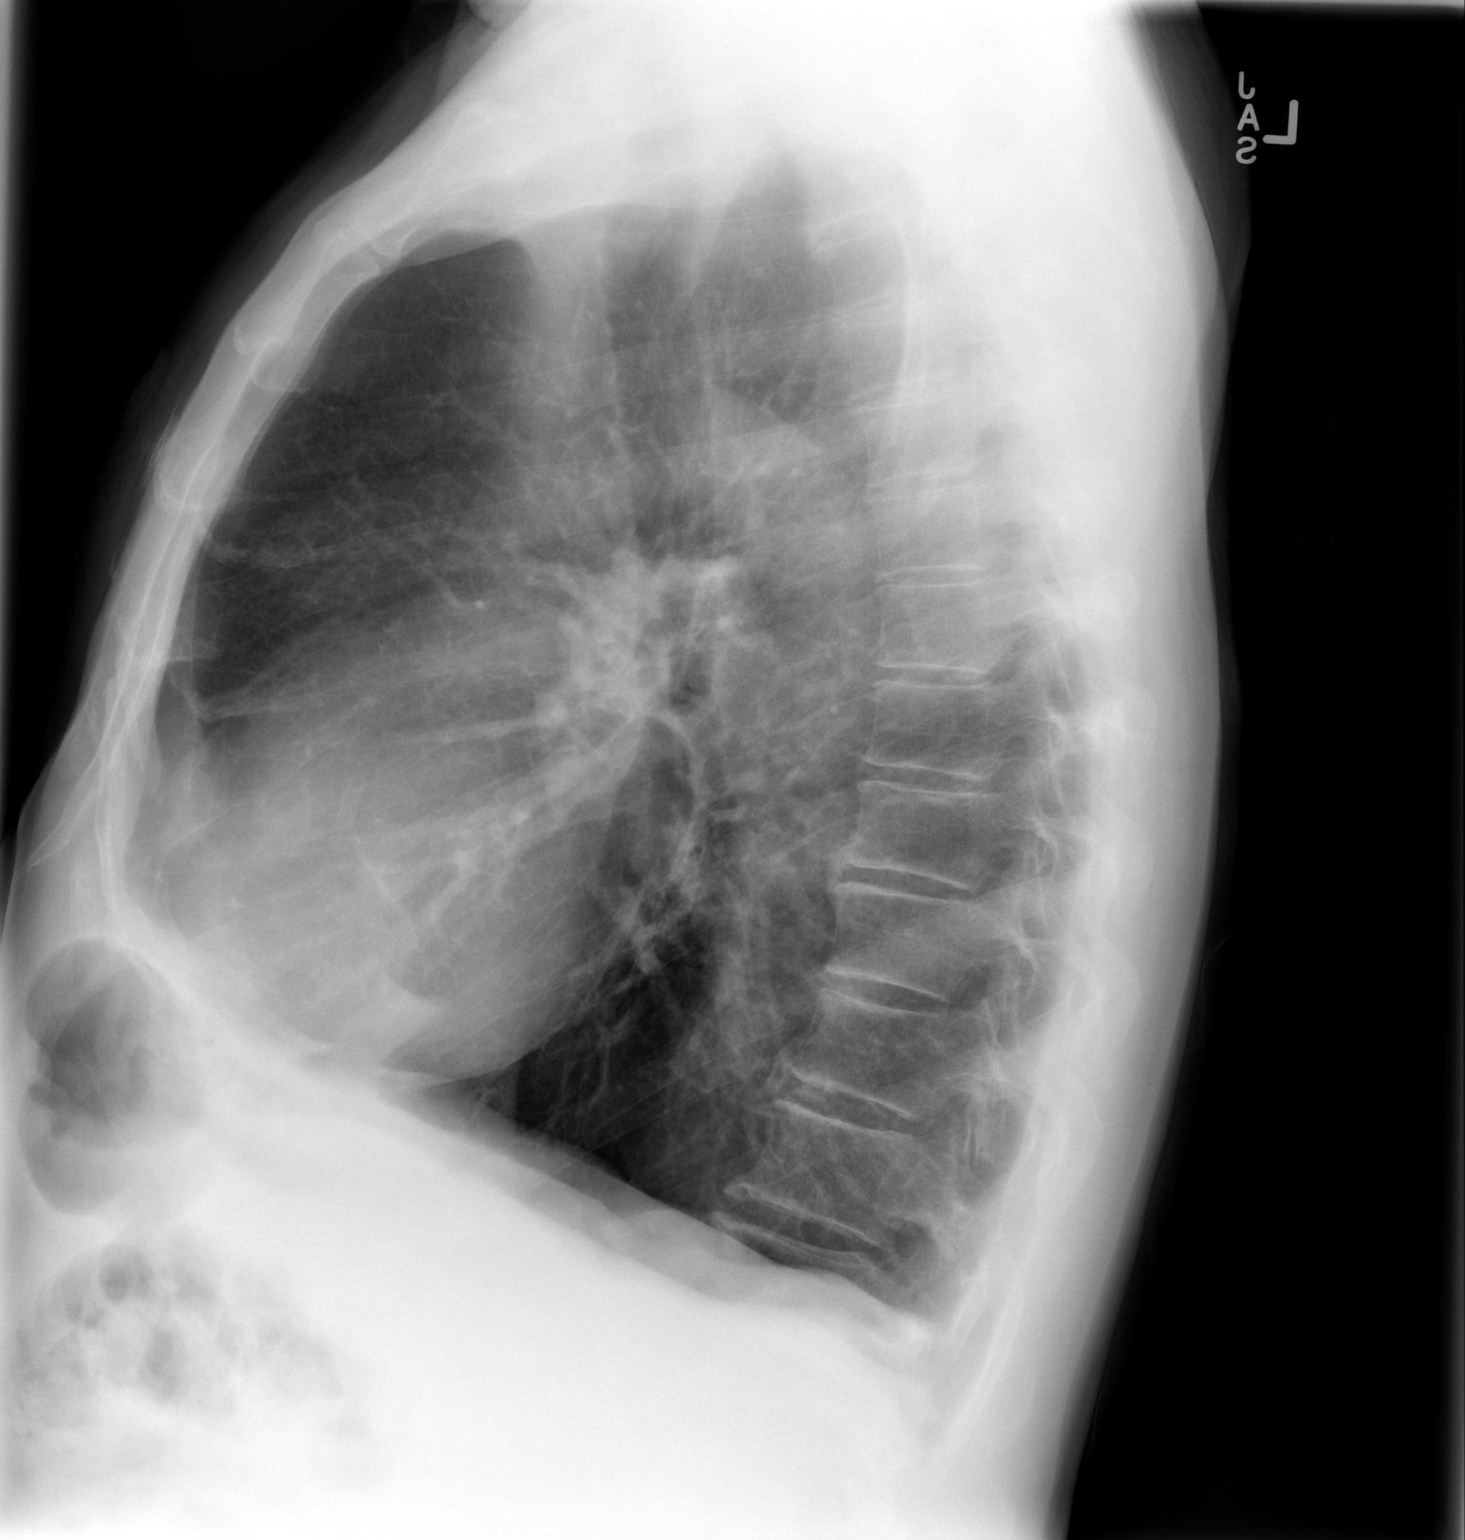

[2 of 2 positions shown; findings below may reference images not displayed]

FINDINGS: Heart size is normal.  No pleural effusion or edema.  No
airspace consolidation identified.  Chronic interstitial coarsening
is noted.
IMPRESSION: 1.  No acute cardiopulmonary abnormalities.

## 2012-06-18 MED ORDER — METHYLPREDNISOLONE SODIUM SUCC 125 MG IJ SOLR
125.0000 mg | Freq: Once | INTRAMUSCULAR | Status: AC
  Administered 2012-06-18: 125 mg via INTRAMUSCULAR
  Filled 2012-06-18: qty 2

## 2012-06-18 MED ORDER — METHYLPREDNISOLONE SODIUM SUCC 125 MG IJ SOLR: 125.0000 mg | Freq: Once | INTRAMUSCULAR | Status: DC

## 2012-06-18 MED ORDER — IPRATROPIUM BROMIDE 0.02 % IN SOLN
0.5000 mg | Freq: Once | RESPIRATORY_TRACT | Status: AC
  Administered 2012-06-18: 0.5 mg via RESPIRATORY_TRACT
  Filled 2012-06-18: qty 2.5

## 2012-06-18 MED ORDER — ALBUTEROL SULFATE (5 MG/ML) 0.5% IN NEBU
5.0000 mg | INHALATION_SOLUTION | Freq: Once | RESPIRATORY_TRACT | Status: AC
  Administered 2012-06-18: 5 mg via RESPIRATORY_TRACT
  Filled 2012-06-18: qty 1

## 2012-06-18 NOTE — ED Provider Notes (Signed)
History     CSN: [id number]  Arrival date & time 06/18/12  1527   First MD Initiated Contact with Patient 06/18/12 1710      Chief Complaint  Patient presents with  . Chest Pain    (Consider location/radiation/quality/duration/timing/severity/associated sxs/prior treatment) Patient is a 61 y.o. male presenting with shortness of breath. The history is provided by the patient.  Shortness of Breath  The current episode started more than 1 week ago. The problem occurs continuously. The problem has been gradually worsening. The problem is moderate. The symptoms are relieved by rest. Associated symptoms include chest pain, shortness of breath and wheezing. Pertinent negatives include no chest pressure, no fever, no sore throat, no stridor and no cough.  Pt with COPD. Progressive cough, shortness of breath. Was seen by his PCP 7 days ago, started on prednisone pack. States taking spiriva, advair, albuterol at home. Not improving. Denies fever, chills, chest pain. States sob worsening with walking and activity. No other complaints.   Past Medical History  Diagnosis Date  . Hyperlipidemia   . COPD (chronic obstructive pulmonary disease)   . Depression   . GERD (gastroesophageal reflux disease)   . Colon polyps   . Status post dilation of esophageal narrowing 2000  . Peptic ulcer   . Coronary artery disease 11/2009    Anterior MI with late presentation 11/2009. Cath showed a 100% proximal LAD occlusion, 99% mid RCA. Had a PCI and 2 DES to both LAD and RCA. EF was 35%. Nuclear stress test 05/13: prior anterior/inferior infarcts without ischemic, EF 43%  . Hypertension   . CARDIOMYOPATHY 03/24/2010    did not tolerate Toprol and Lisinopril.     Past Surgical History  Procedure Date  . Penile prosthesis implant   . Esophagogastroduodenoscopy 2008  . Colonoscopy   . Esophageal dilation   . Cardiac catheterization   . Coronary angioplasty with stent placement 09/2009    LAD 3.0 X23 mm  Xience DES, RCA: 4.0 X 15 mm Xience DES    Family History  Problem Relation Age of Onset  . Heart attack Brother     History  Substance Use Topics  . Smoking status: Current Everyday Smoker -- 1.5 packs/day for 45 years    Types: Cigarettes  . Smokeless tobacco: Not on file  . Alcohol Use: No      Review of Systems  Constitutional: Negative for fever and chills.  HENT: Negative for congestion, sore throat and neck pain.   Respiratory: Positive for shortness of breath and wheezing. Negative for cough, chest tightness and stridor.   Cardiovascular: Positive for chest pain. Negative for palpitations and leg swelling.  Gastrointestinal: Negative for nausea, vomiting and abdominal pain.  Musculoskeletal: Negative.   Skin: Negative.   Neurological: Negative for dizziness, weakness, numbness and headaches.    Allergies  Review of patient's allergies indicates no known allergies.  Home Medications   Current Outpatient Rx  Name Route Sig Dispense Refill  . ALBUTEROL SULFATE HFA 108 (90 BASE) MCG/ACT IN AERS Inhalation Inhale 2 puffs into the lungs every 6 (six) hours as needed. For wheezing    . ASPIRIN 81 MG PO TABS Oral Take 81 mg by mouth daily.      . ATORVASTATIN CALCIUM 20 MG PO TABS Oral Take 1 tablet (20 mg total) by mouth daily. 30 tablet 1  . CLOPIDOGREL BISULFATE 75 MG PO TABS Oral Take 1 tablet (75 mg total) by mouth daily. 90 tablet 3  . ESOMEPRAZOLE  MAGNESIUM 40 MG PO CPDR Oral Take 40 mg by mouth daily before breakfast.      . FLUTICASONE PROPIONATE 50 MCG/ACT NA SUSP Nasal Place 2 sprays into the nose daily.    [name] FLUTICASONE-SALMETEROL 500-50 MCG/DOSE IN AEPB Inhalation Inhale 1 puff into the lungs every 12 (twelve) hours.      [name] LEVALBUTEROL HCL 0.63 MG/3ML IN NEBU Nebulization Take 1 ampule by nebulization every 4 (four) hours as needed.      [name] LORAZEPAM 0.5 MG PO TABS Oral Take 0.5 mg by mouth at bedtime.    [name] PREDNISONE 10 MG PO TABS Oral Take 10 mg by mouth  daily. For 7 days    . SERTRALINE HCL 50 MG PO TABS  50 mg. Takes 3 tablets daily.    [name] TIOTROPIUM BROMIDE MONOHYDRATE 18 MCG IN CAPS Inhalation Place 18 mcg into inhaler and inhale daily.      [name] NITROGLYCERIN 0.4 MG SL SUBL Sublingual Place 1 tablet (0.4 mg total) under the tongue every 5 (five) minutes as needed. 25 tablet 3    BP 130/86  Pulse 76  Temp 98.3 F (36.8 C) (Oral)  Resp 20  SpO2 95%  Physical Exam  Nursing note and vitals reviewed. Constitutional: He appears well-developed and well-nourished. No distress.  Cardiovascular: Normal rate, regular rhythm and normal heart sounds.   Pulmonary/Chest: Effort normal. No respiratory distress. He has wheezes. He has no rales.       Expiratory wheezes bilaterally  Abdominal: Soft. Bowel sounds are normal. He exhibits no distension. There is no tenderness. There is no rebound.  Musculoskeletal: He exhibits no edema.  Neurological: He is alert.  Skin: Skin is warm and dry.  Psychiatric: He has a normal mood and affect.    ED Course  Procedures (including critical care time)  Pt denies CP, progressive SOB and cough with thick sputum. Finishing prednisone pack with no imrovement. Pt ambulated in the hallway, maintaining his oxygen above 92 on RA. Nebs ordered, steroid IM ordrered.   Results for orders placed during the hospital encounter of 06/18/12  BASIC METABOLIC PANEL      Component Value Range   Sodium 134 (*) 135 - 145 mEq/L   Potassium 4.6  3.5 - 5.1 mEq/L   Chloride 96  96 - 112 mEq/L   CO2 24  19 - 32 mEq/L   Glucose, Bld 106 (*) 70 - 99 mg/dL   BUN 10  6 - 23 mg/dL   Creatinine, Ser 3.08  0.50 - 1.35 mg/dL   Calcium 9.5  8.4 - 65.7 mg/dL   GFR calc non Af Amer >90  >90 mL/min   GFR calc Af Amer >90  >90 mL/min  CBC      Component Value Range   WBC 10.5  4.0 - 10.5 K/uL   RBC 5.49  4.22 - 5.81 MIL/uL   Hemoglobin 16.5  13.0 - 17.0 g/dL   HCT 84.6  96.2 - 95.2 %   MCV 87.4  78.0 - 100.0 fL   MCH 30.1  26.0 -  34.0 pg   MCHC 34.4  30.0 - 36.0 g/dL   RDW 84.1  32.4 - 40.1 %   Platelets 224  150 - 400 K/uL  POCT I-STAT TROPONIN I      Component Value Range   Troponin i, poc 0.02  0.00 - 0.08 ng/mL   Comment 3            Dg Chest 2  View  06/18/2012  *RADIOLOGY REPORT*  Clinical Data: Shortness of breath and chest pain.  CHEST - 2 VIEW  Comparison: None  Findings: Heart size is normal.  No pleural effusion or edema.  No airspace consolidation identified.  Chronic interstitial coarsening is noted.  IMPRESSION:  1.  No acute cardiopulmonary abnormalities.  Original Report Authenticated By: Rosealee Albee, M.D.    Date: 06/18/2012  Rate: 90  Rhythm: normal sinus rhythm  QRS Axis: right  Intervals: normal  ST/T Wave abnormalities: normal  Conduction Disutrbances:none  Narrative Interpretation: Q waves anteriorly  Old EKG Reviewed: none available   7:16 PM CXR negative. Troponin negative. i suspect COPD exacerbation. Doubt ACS since no CP, pt is wheezing, hx of severe COPD, coughing. Doubt PE. He is afebrile, no WBC, doubt pneumonia. He is maintaining his oxygen sat. Offered admission since worsening with at home treatments, however, pt refused. States will follow up with his PCP tomorrow.  Filed Vitals:   06/18/12 1751  BP: 130/86  Pulse: 76  Temp:   Resp: 20    1. COPD (chronic obstructive pulmonary disease)   2. Shortness of breath       MDM         Lottie Mussel, PA 06/19/12 [id number]

## 2012-06-18 NOTE — ED Notes (Signed)
Burning to L side of chest since yesterday.  Reports sob and productive cough with light green sputum x 3 weeks. Taking antibiotic for URI.

## 2012-06-18 NOTE — Discharge Instructions (Signed)
Your chest x-ray is normal. Your labs are normal. Continue all your regular medications. Take albuterol inhaler 2 puffs every 4 hrs when awake. Return if worsening.  Chronic Obstructive Pulmonary Disease Chronic obstructive pulmonary disease (COPD) is a condition in which airflow from the lungs is restricted. The lungs can never return to normal, but there are measures you can take which will improve them and make you feel better. CAUSES   Smoking.   Exposure to secondhand smoke.   Breathing in irritants (pollution, cigarette smoke, strong smells, aerosol sprays, paint fumes).   History of lung infections.  TREATMENT  Treatment focuses on making you comfortable (supportive care). Your caregiver may prescribe medications (inhaled or pills) to help improve your breathing. HOME CARE INSTRUCTIONS   If you smoke, stop smoking.   Avoid exposure to smoke, chemicals, and fumes that aggravate your breathing.   Take antibiotic medicines as directed by your caregiver.   Avoid medicines that dry up your system and slow down the elimination of secretions (antihistamines and cough syrups). This decreases respiratory capacity and may lead to infections.   Drink enough water and fluids to keep your urine clear or pale yellow. This loosens secretions.   Use humidifiers at home and at your bedside if they do not make breathing difficult.   Receive all protective vaccines your caregiver suggests, especially pneumococcal and influenza.   Use home oxygen as suggested.   Stay active. Exercise and physical activity will help maintain your ability to do things you want to do.   Eat a healthy diet.  SEEK MEDICAL CARE IF:   You develop pus-like mucus (sputum).   Breathing is more labored or exercise becomes difficult to do.   You are running out of the medicine you take for your breathing.  SEEK IMMEDIATE MEDICAL CARE IF:   You have a rapid heart rate.   You have agitation, confusion, tremors, or  are in a stupor (family members may need to observe this).   It becomes difficult to breathe.   You develop chest pain.   You have a fever.  MAKE SURE YOU:   Understand these instructions.   Will watch your condition.   Will get help right away if you are not doing well or get worse.  Document Released: 09/15/2005 Document Revised: 11/25/2011 Document Reviewed: 02/05/2011 [facility] Patient Information 2012 [place], [place].

## 2012-06-19 NOTE — ED Provider Notes (Signed)
Medical screening examination/treatment/procedure(s) were performed by non-physician practitioner and as supervising physician I was immediately available for consultation/collaboration.  Mieke Brinley K Linker, MD 06/19/12 1504 

## 2012-07-20 HISTORY — PX: HERNIA REPAIR: SHX51

## 2012-07-21 ENCOUNTER — Encounter: Payer: Self-pay | Admitting: Gastroenterology

## 2012-07-21 ENCOUNTER — Encounter: Payer: Self-pay | Admitting: *Deleted

## 2012-07-24 ENCOUNTER — Encounter: Payer: Self-pay | Admitting: Pulmonary Disease

## 2012-07-24 ENCOUNTER — Ambulatory Visit (INDEPENDENT_AMBULATORY_CARE_PROVIDER_SITE_OTHER): Payer: BC Managed Care – PPO | Admitting: Pulmonary Disease

## 2012-07-24 VITALS — BP 112/62 | HR 72 | Temp 97.6°F | Ht 70.0 in | Wt 164.8 lb

## 2012-07-24 DIAGNOSIS — F17200 Nicotine dependence, unspecified, uncomplicated: Secondary | ICD-10-CM

## 2012-07-24 DIAGNOSIS — J449 Chronic obstructive pulmonary disease, unspecified: Secondary | ICD-10-CM

## 2012-07-24 NOTE — Progress Notes (Signed)
Subjective:    Patient ID: Leslie Sims, male    DOB: March 19, 1951, 61 y.o.   MRN: [id number]  HPI This is a very pleasant 61 year old male with a past medical history significant for COPD who comes her clinic today to establish care for the same. He was referred to [place] by the [facility] family practice. He states that he has been on Advair and Spiriva for many years for COPD after he noticed dyspnea on exertion the leg and adulthood. He had a normal childhood without respiratory complications but smoked one half pack per day for greater than 40 years and continues to smoke every day. He never had pneumonia, bronchitis, or an exacerbation of COPD until June 2013 what sounds like he had an acute exacerbation of COPD. He was seen in the emergency room at [facility] and was given prednisone and antibiotics which she said helped the shortness of breath significantly. However since that attack he has had ongoing dyspnea and doesn't felt like he's returned to his baseline. 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 He states that recently after using some deck stain without using a mask he developed significant wheezing and shortness of breath. As recent as 2 or 3 months ago he was walking 1-1/4 miles on a daily basis without difficulty. Since his asthma exacerbation he has not been participating in regular exercise. He states that he has a minimal cough at baseline which is rarely productive of yellow phlegm mostly in the evenings. Prior to the recent acute exacerbation of COPD he did not require use of his rescue inhalers however after that episode he needed to use them 2-3 times a day. He is now only having to use it once or twice per day in the last few weeks.   he has tried to quit smoking unsuccessfully many times in the past.   Past Medical History  Diagnosis Date  . Hyperlipidemia   . COPD (chronic obstructive  pulmonary disease)   . Depression   . GERD (gastroesophageal reflux disease)   . Colon polyps   . Status post dilation of esophageal narrowing 2000  . Peptic ulcer   . Coronary artery disease 11/2009    Anterior MI with late presentation 11/2009. Cath showed a 100% proximal LAD occlusion, 99% mid RCA. Had a PCI and 2 DES to both LAD and RCA. EF was 35%. Nuclear stress test 05/13: prior anterior/inferior infarcts without ischemic, EF 43%  . Hypertension   . CARDIOMYOPATHY 03/24/2010    did not tolerate Toprol and Lisinopril.   . Insomnia   . Hemorrhoids   . MI (myocardial infarction)   . Atherosclerosis   . Anxiety   . Headache   . Tinea pedis   . Myalgia   . Helicobacter pylori (H. pylori)   . Thyroid dysfunction   . Chest pain   . Back pain   . Hernia   . Allergic rhinitis      Family History  Problem Relation Age of Onset  . Heart attack Brother      History   Social History  . Marital Status: Married    Spouse Name: N/A    Number of Children: N/A  . Years of Education: N/A   Occupational History  . Not on file.   Social History Main Topics  . Smoking status: Current Everyday Smoker -- 1.5 packs/day for 42 years  Types: Cigarettes  . Smokeless tobacco: Never Used  . Alcohol Use: No  . Drug Use: No  . Sexually Active: Not on file   Other Topics Concern  . Not on file   Social History Narrative  . No narrative on file     Allergies  Allergen Reactions  . Wellbutrin (Bupropion)     unknown     Outpatient Prescriptions Prior to Visit  Medication Sig Dispense Refill  . albuterol (VENTOLIN HFA) 108 (90 BASE) MCG/ACT inhaler Inhale 2 puffs into the lungs every 6 (six) hours as needed. For wheezing      . aspirin 81 MG tablet Take 81 mg by mouth daily.        [name] atorvastatin (LIPITOR) 20 MG tablet Take 1 tablet (20 mg total) by mouth daily.  30 tablet  1  . clopidogrel (PLAVIX) 75 MG tablet Take 1 tablet (75 mg total) by mouth daily.  90 tablet  3  .  esomeprazole (NEXIUM) 40 MG capsule Take 40 mg by mouth daily before breakfast.        . Fluticasone-Salmeterol (ADVAIR DISKUS) 500-50 MCG/DOSE AEPB Inhale 1 puff into the lungs every 12 (twelve) hours.        [name] levalbuterol (XOPENEX) 0.63 MG/3ML nebulizer solution Take 1 ampule by nebulization every 4 (four) hours as needed.        [name] LORazepam (ATIVAN) 0.5 MG tablet Take 0.5 mg by mouth at bedtime.      . nitroGLYCERIN (NITROSTAT) 0.4 MG SL tablet Place 1 tablet (0.4 mg total) under the tongue every 5 (five) minutes as needed.  25 tablet  3  . sertraline (ZOLOFT) 50 MG tablet 50 mg. Takes 3 tablets daily.      . sucralfate (CARAFATE) 1 G tablet Take 1 g by mouth 4 (four) times daily as needed.       . tiotropium (SPIRIVA) 18 MCG inhalation capsule Place 18 mcg into inhaler and inhale daily.        . fluticasone (FLONASE) 50 MCG/ACT nasal spray Place 2 sprays into the nose daily.      [name] levofloxacin (LEVAQUIN) 500 MG tablet Take 500 mg by mouth daily.      . predniSONE (DELTASONE) 10 MG tablet Take 10 mg by mouth daily. For 7 days          Review of Systems  Constitutional: Negative for fever, chills, activity change and appetite change.  HENT: Positive for trouble swallowing. Negative for hearing loss, ear pain, congestion, rhinorrhea, sneezing, neck pain, neck stiffness, postnasal drip and sinus pressure.   Eyes: Negative for redness, itching and visual disturbance.  Respiratory: Positive for cough and shortness of breath. Negative for chest tightness and wheezing.   Cardiovascular: Negative for chest pain, palpitations and leg swelling.  Gastrointestinal: Negative for nausea, vomiting, abdominal pain, diarrhea, constipation, blood in stool and abdominal distention.  Musculoskeletal: Negative for myalgias, joint swelling, arthralgias and gait problem.  Skin: Negative for rash.  Neurological: Positive for light-headedness. Negative for dizziness, numbness and headaches.  Hematological:  Bruises/bleeds easily.  Psychiatric/Behavioral: Negative for confusion and dysphoric mood.       Objective:   Physical Exam  Filed Vitals:   07/24/12 1508  BP: 112/62  Pulse: 72  Temp: 97.6 F (36.4 C)  TempSrc: Oral  Height: 5\' 10"  (1.778 m)  Weight: 164 lb 12.8 oz (74.753 kg)  SpO2: 96%    Gen: well appearing, no acute distress HEENT: NCAT, PERRL, EOMi, OP clear,  neck supple without masses PULM: Few scattered exp wheezes CV: distant heart sounds, no clear murmur, gallop or rub AB: BS+, soft, nontender, no hsm Ext: warm, no edema, no clubbing, no cyanosis Derm: no rash or skin breakdown Neuro: A&Ox4, CN II-XII intact, strength 5/5 in all 4 extremities  June 2013 chest x-ray personally reviewed by me: Emphysema bilaterally  August 2013 simple spirometry: Ratio 45% FEV1 1.15 L (31% predicted)      Assessment & Plan:   COPD COPD: GOLD Grade C  Combined recommendations from the [employer], [employer], Designer, television/film set, European Respiratory Society (Qaseem A et al, Ann Intern Med. 2011;155(3):179) recommends tobacco cessation, pulmonary rehab (for symptomatic patients with an FEV1 < 50% predicted), supplemental oxygen (for patients with SaO2 <88% or paO2 <55), and appropriate bronchodilator therapy.  In regards to long acting bronchodilators, they recommend monotherapy (FEV1 60-80% with symptoms weak evidence, FEV1 with symptoms <60% strong evidence), or combination therapy (FEV1 <60% with symptoms, strong recommendation, moderate evidence).  One should also provide patients with annual immunizations and consider therapy for prevention of COPD exacerbations (ie. roflumilast or azithromycin) when appopriate.  -O2 therapy: not indicated -Immunizations: Up to date -Tobacco use: Advised at length to quit, see below (THIS IS REALLY THE ONLY INTERVENTION THAT WE CAN MAKE THAT WOULD IMPROVE HIS COPD SYMPTOMS AT THIS POINT AS HE  IS ON MAXIMUM MEDICAL THERAPY.) -Exercise: Encouraged to start walking regularly again -Bronchodilator therapy: Continue Spiriva and Advair, reviewed proper inhaler use today -Exacerbation prevention: n/a   SMOKER Explained to Mr. Hollers at length today in clinic that the only intervention we can make for his COPD would be for him to quit smoking. He states he has tried multiple therapies in the past including Chantix (gave him mental health disturbance), Wellbutrin (didn't work), nicotine replacement therapy (didn't work), and hypnosis which also did not work. He wants to quit and we reviewed the major complications of ongoing tobacco use in clinic today. He is reluctant to try Chantix again due to his significant depression and anxiety. I advised him to call the quit line (1-800-quit- now) to obtain free counseling and nicotine replacement therapy. He states that he will consider doing this.    Updated Medication List Outpatient Encounter Prescriptions as of 07/24/2012  Medication Sig Dispense Refill  . albuterol (VENTOLIN HFA) 108 (90 BASE) MCG/ACT inhaler Inhale 2 puffs into the lungs every 6 (six) hours as needed. For wheezing      . aspirin 81 MG tablet Take 81 mg by mouth daily.        [name] atorvastatin (LIPITOR) 20 MG tablet Take 1 tablet (20 mg total) by mouth daily.  30 tablet  1  . clopidogrel (PLAVIX) 75 MG tablet Take 1 tablet (75 mg total) by mouth daily.  90 tablet  3  . esomeprazole (NEXIUM) 40 MG capsule Take 40 mg by mouth daily before breakfast.        . Fluticasone-Salmeterol (ADVAIR DISKUS) 500-50 MCG/DOSE AEPB Inhale 1 puff into the lungs every 12 (twelve) hours.        [name] levalbuterol (XOPENEX) 0.63 MG/3ML nebulizer solution Take 1 ampule by nebulization every 4 (four) hours as needed.        [name] LORazepam (ATIVAN) 0.5 MG tablet Take 0.5 mg by mouth at bedtime.      . nitroGLYCERIN (NITROSTAT) 0.4 MG SL tablet Place 1 tablet (0.4 mg total) under the tongue every 5 (five) minutes  as  needed.  25 tablet  3  . Pseudoephedrine-DM-GG (ROBITUSSIN COLD & COUGH PO) As directed per bottle when needed for cough      . sertraline (ZOLOFT) 50 MG tablet 50 mg. Takes 3 tablets daily.      . sucralfate (CARAFATE) 1 G tablet Take 1 g by mouth 4 (four) times daily as needed.       . tiotropium (SPIRIVA) 18 MCG inhalation capsule Place 18 mcg into inhaler and inhale daily.        [name] DISCONTD: fluticasone (FLONASE) 50 MCG/ACT nasal spray Place 2 sprays into the nose daily.      [name] DISCONTD: levofloxacin (LEVAQUIN) 500 MG tablet Take 500 mg by mouth daily.      [name] DISCONTD: predniSONE (DELTASONE) 10 MG tablet Take 10 mg by mouth daily. For 7 days

## 2012-07-24 NOTE — Patient Instructions (Signed)
Keep taking your medications as written Call the [facility] QuitLine (1-800-QUIT-NOW) for free counseling and nicotine replacement therapy (patches, gum, etc). Quit smoking! Start exercising again as you were doing before.

## 2012-07-24 NOTE — Assessment & Plan Note (Signed)
Explained to Leslie Sims at length today in clinic that the only intervention we can make for his COPD would be for him to quit smoking. He states he has tried multiple therapies in the past including Chantix (gave him mental health disturbance), Wellbutrin (didn't work), nicotine replacement therapy (didn't work), and hypnosis which also did not work. He wants to quit and we reviewed the major complications of ongoing tobacco use in clinic today. He is reluctant to try Chantix again due to his significant depression and anxiety. I advised him to call the quit line (1-800-quit- now) to obtain free counseling and nicotine replacement therapy. He states that he will consider doing this.

## 2012-07-24 NOTE — Assessment & Plan Note (Signed)
COPD: GOLD Grade C  Combined recommendations from the [employer]	[place], [employer], Designer, television/film set, European Respiratory Society (Qaseem A et al, Ann Intern Med. 2011;155(3):179) recommends tobacco cessation, pulmonary rehab (for symptomatic patients with an FEV1 < 50% predicted), supplemental oxygen (for patients with SaO2 <88% or paO2 <55), and appropriate bronchodilator therapy.  In regards to long acting bronchodilators, they recommend monotherapy (FEV1 60-80% with symptoms weak evidence, FEV1 with symptoms <60% strong evidence), or combination therapy (FEV1 <60% with symptoms, strong recommendation, moderate evidence).  One should also provide patients with annual immunizations and consider therapy for prevention of COPD exacerbations (ie. roflumilast or azithromycin) when appopriate.  -O2 therapy: not indicated -Immunizations: Up to date -Tobacco use: Advised at length to quit, see below (THIS IS REALLY THE ONLY INTERVENTION THAT WE CAN MAKE THAT WOULD IMPROVE HIS COPD SYMPTOMS AT THIS POINT AS HE IS ON MAXIMUM MEDICAL THERAPY.) -Exercise: Encouraged to start walking regularly again -Bronchodilator therapy: Continue Spiriva and Advair, reviewed proper inhaler use today -Exacerbation prevention: n/a

## 2012-08-02 ENCOUNTER — Telehealth (INDEPENDENT_AMBULATORY_CARE_PROVIDER_SITE_OTHER): Payer: Self-pay

## 2012-08-02 NOTE — Telephone Encounter (Signed)
Patient calling requesting an appointment to Evaluate Possible Hernia.  Access to EPIC was not available at the time, I informed Leslie Sims of this and I would call him at a later time. 6:40 pm--Called Mr. Markman @ [phone], no answer left voicemail for patient to return my in the morning.

## 2012-08-11 ENCOUNTER — Telehealth (INDEPENDENT_AMBULATORY_CARE_PROVIDER_SITE_OTHER): Payer: Self-pay

## 2012-08-11 NOTE — Telephone Encounter (Signed)
Left voice message for Leslie Sims to call our office re:  Appointment w/Dr. Biagio Quint on 08/18/12 @ 10:45 am if he would still like to follow up with our office after his GI appointment on 08/15/12.

## 2012-08-15 ENCOUNTER — Ambulatory Visit (INDEPENDENT_AMBULATORY_CARE_PROVIDER_SITE_OTHER): Payer: BC Managed Care – PPO | Admitting: Gastroenterology

## 2012-08-15 ENCOUNTER — Encounter: Payer: Self-pay | Admitting: Gastroenterology

## 2012-08-15 VITALS — BP 126/70 | HR 68 | Ht 68.0 in | Wt 163.4 lb

## 2012-08-15 DIAGNOSIS — J449 Chronic obstructive pulmonary disease, unspecified: Secondary | ICD-10-CM

## 2012-08-15 DIAGNOSIS — K625 Hemorrhage of anus and rectum: Secondary | ICD-10-CM

## 2012-08-15 DIAGNOSIS — F17210 Nicotine dependence, cigarettes, uncomplicated: Secondary | ICD-10-CM

## 2012-08-15 DIAGNOSIS — F17200 Nicotine dependence, unspecified, uncomplicated: Secondary | ICD-10-CM

## 2012-08-15 DIAGNOSIS — I2510 Atherosclerotic heart disease of native coronary artery without angina pectoris: Secondary | ICD-10-CM

## 2012-08-15 DIAGNOSIS — Z86010 Personal history of colonic polyps: Secondary | ICD-10-CM

## 2012-08-15 DIAGNOSIS — K644 Residual hemorrhoidal skin tags: Secondary | ICD-10-CM

## 2012-08-15 DIAGNOSIS — K219 Gastro-esophageal reflux disease without esophagitis: Secondary | ICD-10-CM

## 2012-08-15 DIAGNOSIS — Z7901 Long term (current) use of anticoagulants: Secondary | ICD-10-CM

## 2012-08-15 MED ORDER — HYDROCORTISONE ACE-PRAMOXINE 1-1 % RE CREA: TOPICAL_CREAM | Freq: Two times a day (BID) | RECTAL | Status: AC

## 2012-08-15 NOTE — Patient Instructions (Addendum)
You will need to call back to schedule your colonoscopy/endoscopy suggested to you by Dr Mosetta Pigeon will be scheduled for a PreVisit at that time to sign all paperwork and get instructions You will need a Plavix clearance from your cardiologist

## 2012-08-15 NOTE — Progress Notes (Signed)
History of Present Illness:  This is a complex 61 year old Caucasian male referred by Dr. Nilda Simmer for evaluation of multiple GI complaints. Currently, the patient complains of a dull left lower quadrant pain on and off daily without real precipitating or alleviating elements. He has regular bowel movements without melena or hematochezia. Recent examination showed evidence of a left inguinal hernia, and he has surgical consultation planned. I do not have records of recent endoscopy and colonoscopy that were apparently completed and [place], [place] by Dr.Wohl, and apparently multiple polyps were excised. Since that time, patient who has severe coronary artery disease,had a myocardial infarction, with angioplasty with stent placement. He is followed by Dr. Glori Luis in cardiology. Patient had an Outstanding workup evaluation by Dr. Katrinka Blazing on June 13,2011, and July 29,2013 that were reviewed. Patient has COPD and continues to smoke 1/2-2 packs of cigarettes per day, uses a variety of inhalers, has chronic anxiety and depression with psychiatric consult pending, and apparently has a chronic diarrhea state although he denies this today. He has not used alcohol in many years, and denies previous alcoholism, history of hepatitis or pancreatitis.[name] He cares a diagnosis of mild cardiomyopathy, COPD, apparently was previously treated for H. pylori gastritis, and has an history of chronic acid reflux and apparently has had prior endoscopic dilations. Again, I do not have his endoscopic reports for review. Review of recent labs shows normal CBC and metabolic profile.   I have reviewed this patient's present history, medical and surgical past history, allergies and medications.     ROS: The remainder of the 10 point ROS is negative... patient has shortness of breath with exertion but can apparently walk several blocks. His chronic cough but denies hemoptysis. His anxiety and depression seem to be partially  related to his multiple chronic medical problems, and he is on Ativan and Zoloft. Is not on Coumadin but takes Plavix 75 mg a day and aspirin 81 mg a day. His upper gastrointestinal symptoms are fairly well managed with Nexium 40 mg a day and Carafate 1 g 4 times a day as needed. He currently denies dysphagia, anorexia, weight loss, or any specific food intolerances. The past he apparently had reactions to Wellbutrin. Chart review shows no evidence of recent CT scans. I previously did endoscopy and colonoscopy on this patient in June of 2004. He also had previous colonoscopy in April 2001 with multiple adenomatous polyps excised at the [facility] in [place]     Physical Exam:Blood pressure 126/70, pulse 68 and regular, and weight under and 63 pounds with a BMI of 24.84. Patient has diffuse tan but no visible stigmata of chronic liver disease. He has an obvious smoker's appearance.  General well developed well nourished patient in no acute distress, appearing their stated age Eyes PERRLA, no icterus, fundoscopic exam per opthamologist Skin no lesions noted Neck supple, no adenopathy, no thyroid enlargement, no tenderness Chest clear to percussion and auscultation, no wheezes or rhonchi noted.  Heart no significant murmurs, gallops or rubs noted, 1/6 systolic ejection murmur noted.  Abdomen no hepatosplenomegaly masses or tenderness, BS normal. Bulge in his left anal area but no evidence of a large nonreducible hernia.  Rectal inspection normal no fissures, or fistulae noted. He has large bleeding and swollen external hemorrhoids, and I did not attempt digital exam today.  Extremities no acute joint lesions, edema, phlebitis or evidence of cellulitis. Neurologic patient oriented x 3, cranial nerves intact, no focal neurologic deficits noted. Psychological mental status normal  and normal affect.  Assessment and plan:Complex patient with coronary artery disease, coronary artery stenting,  chronic anticoagulation, COPD, and multiple GI complaints include a long history of acid reflux, previously treated H. Pylori infection, and multiple colon polyps. His cardiovascular and pulmonary status appears stable, and I've scheduled him for colonoscopy and endoscopy with propofol sedation and nurse anesthesia. We will get cardiology clearance from Dr. Julien Nordmann to hold his Plavix 5 days before his procedures. I have reviewed her reflux regime with him and we'll continue his PPI therapy as per Dr. Katrinka Blazing. I've suggested to him that he take daily Metamucil, twice a day Sitz Baths and local twice a day Analpram cream. Pending are psychiatric in general surgical consultations.  No diagnosis found.

## 2012-08-16 ENCOUNTER — Telehealth: Payer: Self-pay | Admitting: *Deleted

## 2012-08-16 NOTE — Telephone Encounter (Signed)
See below Ok to hold plavix? Cleared for procedures?

## 2012-08-16 NOTE — Telephone Encounter (Signed)
Patient needs to have a colonoscopy/endoscopy with Dr. Jarold Motto [phone] Fax: [phone]. Patient will also need a Hernia Repair with Dr. Lemar Livings [phone] Fax:[phone]. Patient will need to be off Plavix 1 wk prior to surgeries.  He is trying to get both procedures while being off Plavix.  Please advise if ok to come off Plavix 1 wk prior to surgery and if he is cleared from a cardiac standpoint for the above surgeries. Patient has not scheduled surgeries, awaiting for cadiac clearance.

## 2012-08-16 NOTE — Telephone Encounter (Signed)
Pt informed Understanding verb 

## 2012-08-16 NOTE — Telephone Encounter (Signed)
He is considered at moderate risk for cardiovascular complications. He had a stress test few months ago. Thus, no need for another one.  Plavix can be stopped 1 week before surgery and resumed after. Aspirin 81 mg once daily should be continued without interruption.

## 2012-08-16 NOTE — Telephone Encounter (Signed)
Will fax cardiac clearance letter to # provided

## 2012-08-17 ENCOUNTER — Telehealth: Payer: Self-pay | Admitting: Gastroenterology

## 2012-08-17 NOTE — Telephone Encounter (Signed)
Pt called to ask if we have heard anything from Cardiology concerning his procedures. Found the encounter from 08/16/12 where pt is instructed to stop plavix 5 days prior to his ECL, but remain on 81 mg ASA. Pt is scheduled for a PV on 08/28/12 with his procedures on 09/04/12.

## 2012-08-18 ENCOUNTER — Ambulatory Visit (INDEPENDENT_AMBULATORY_CARE_PROVIDER_SITE_OTHER): Payer: Self-pay | Admitting: General Surgery

## 2012-08-20 DIAGNOSIS — K635 Polyp of colon: Secondary | ICD-10-CM

## 2012-08-20 HISTORY — DX: Polyp of colon: K63.5

## 2012-08-20 HISTORY — PX: ESOPHAGOGASTRODUODENOSCOPY: SHX1529

## 2012-08-23 ENCOUNTER — Other Ambulatory Visit: Payer: BC Managed Care – PPO | Admitting: Gastroenterology

## 2012-08-28 ENCOUNTER — Ambulatory Visit (AMBULATORY_SURGERY_CENTER): Payer: BC Managed Care – PPO | Admitting: *Deleted

## 2012-08-28 ENCOUNTER — Encounter: Payer: Self-pay | Admitting: Gastroenterology

## 2012-08-28 VITALS — Ht 68.0 in | Wt 162.0 lb

## 2012-08-28 DIAGNOSIS — Z1211 Encounter for screening for malignant neoplasm of colon: Secondary | ICD-10-CM

## 2012-08-28 DIAGNOSIS — R197 Diarrhea, unspecified: Secondary | ICD-10-CM

## 2012-08-28 DIAGNOSIS — K219 Gastro-esophageal reflux disease without esophagitis: Secondary | ICD-10-CM

## 2012-08-28 DIAGNOSIS — R10814 Left lower quadrant abdominal tenderness: Secondary | ICD-10-CM

## 2012-08-28 MED ORDER — MOVIPREP 100 G PO SOLR: ORAL | Status: DC

## 2012-08-30 ENCOUNTER — Ambulatory Visit: Payer: Self-pay | Admitting: Family Medicine

## 2012-08-30 IMAGING — CR DG CHEST 2V
1 series · 2 of 2 positions shown · non-contrast
Comparison: none

REASON FOR EXAM: BRONCHITIS COUGH
COMMENTS:

[Series 1: pa · 0.17mm/px · 2 of 2 slices shown]
[im 1/2]
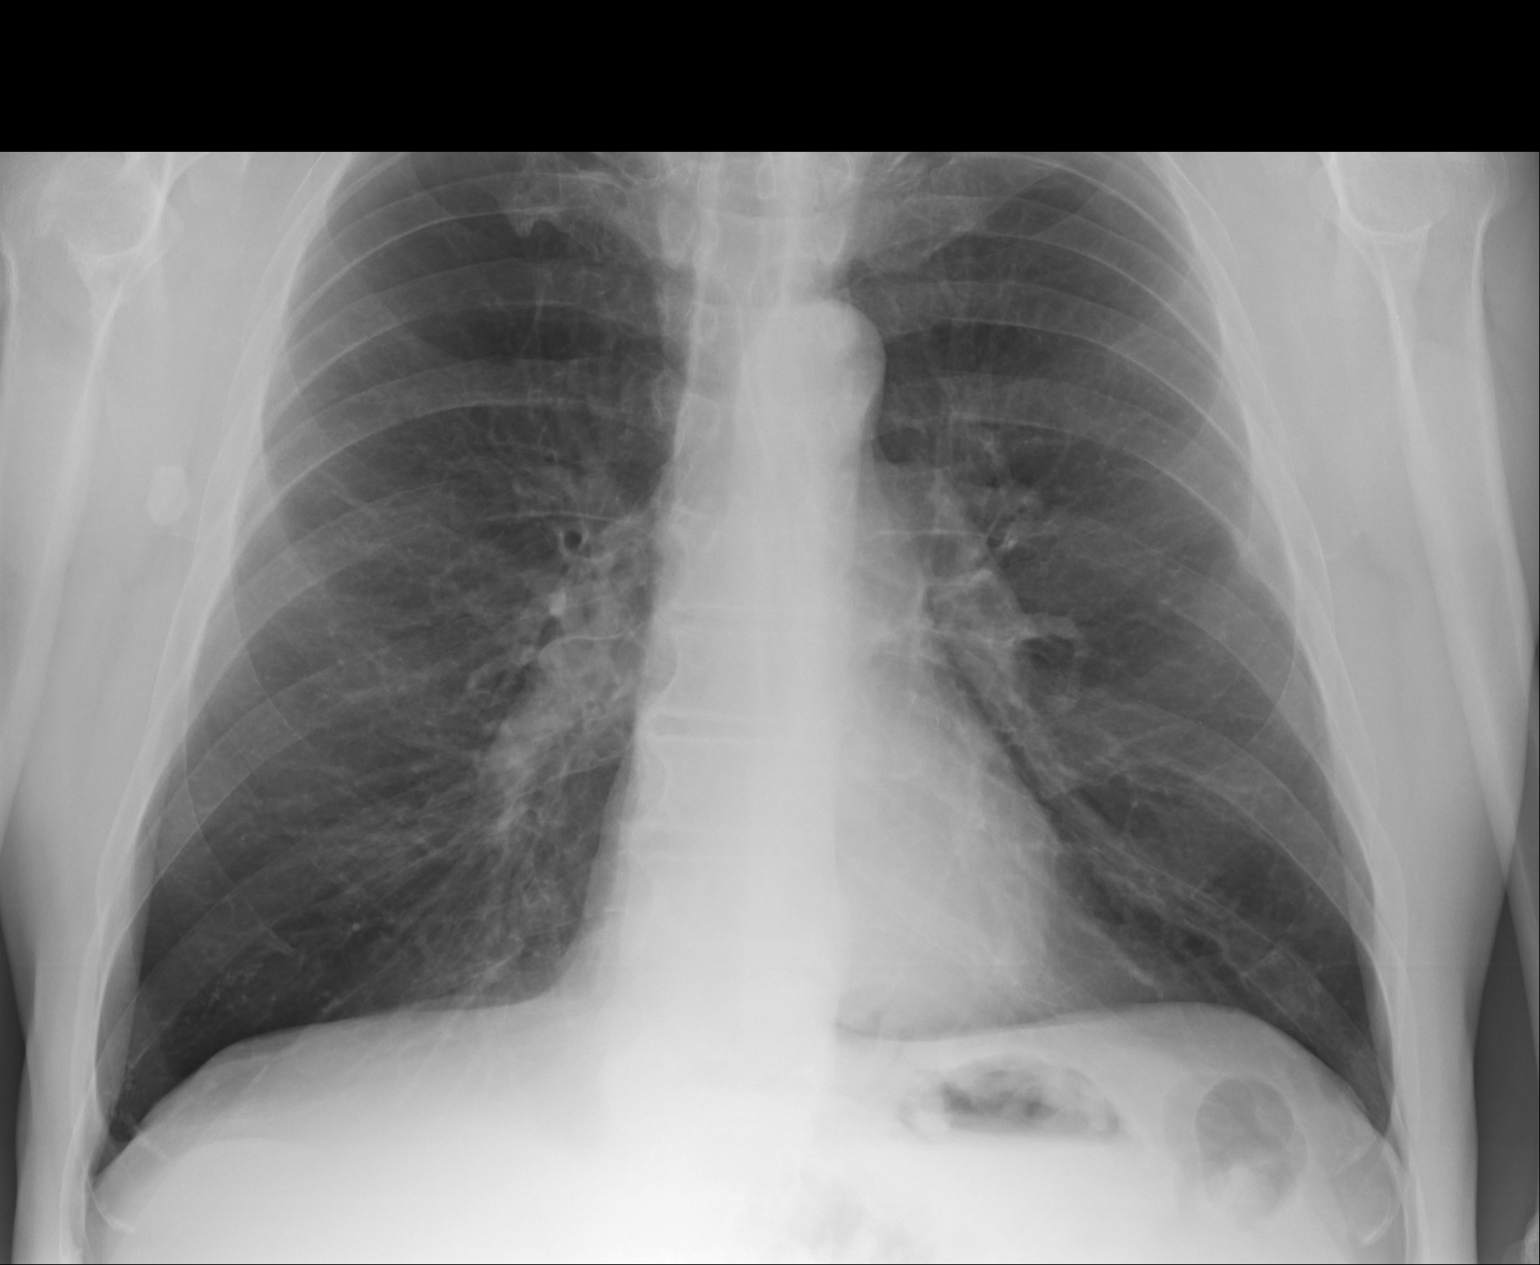
[im 2/2]
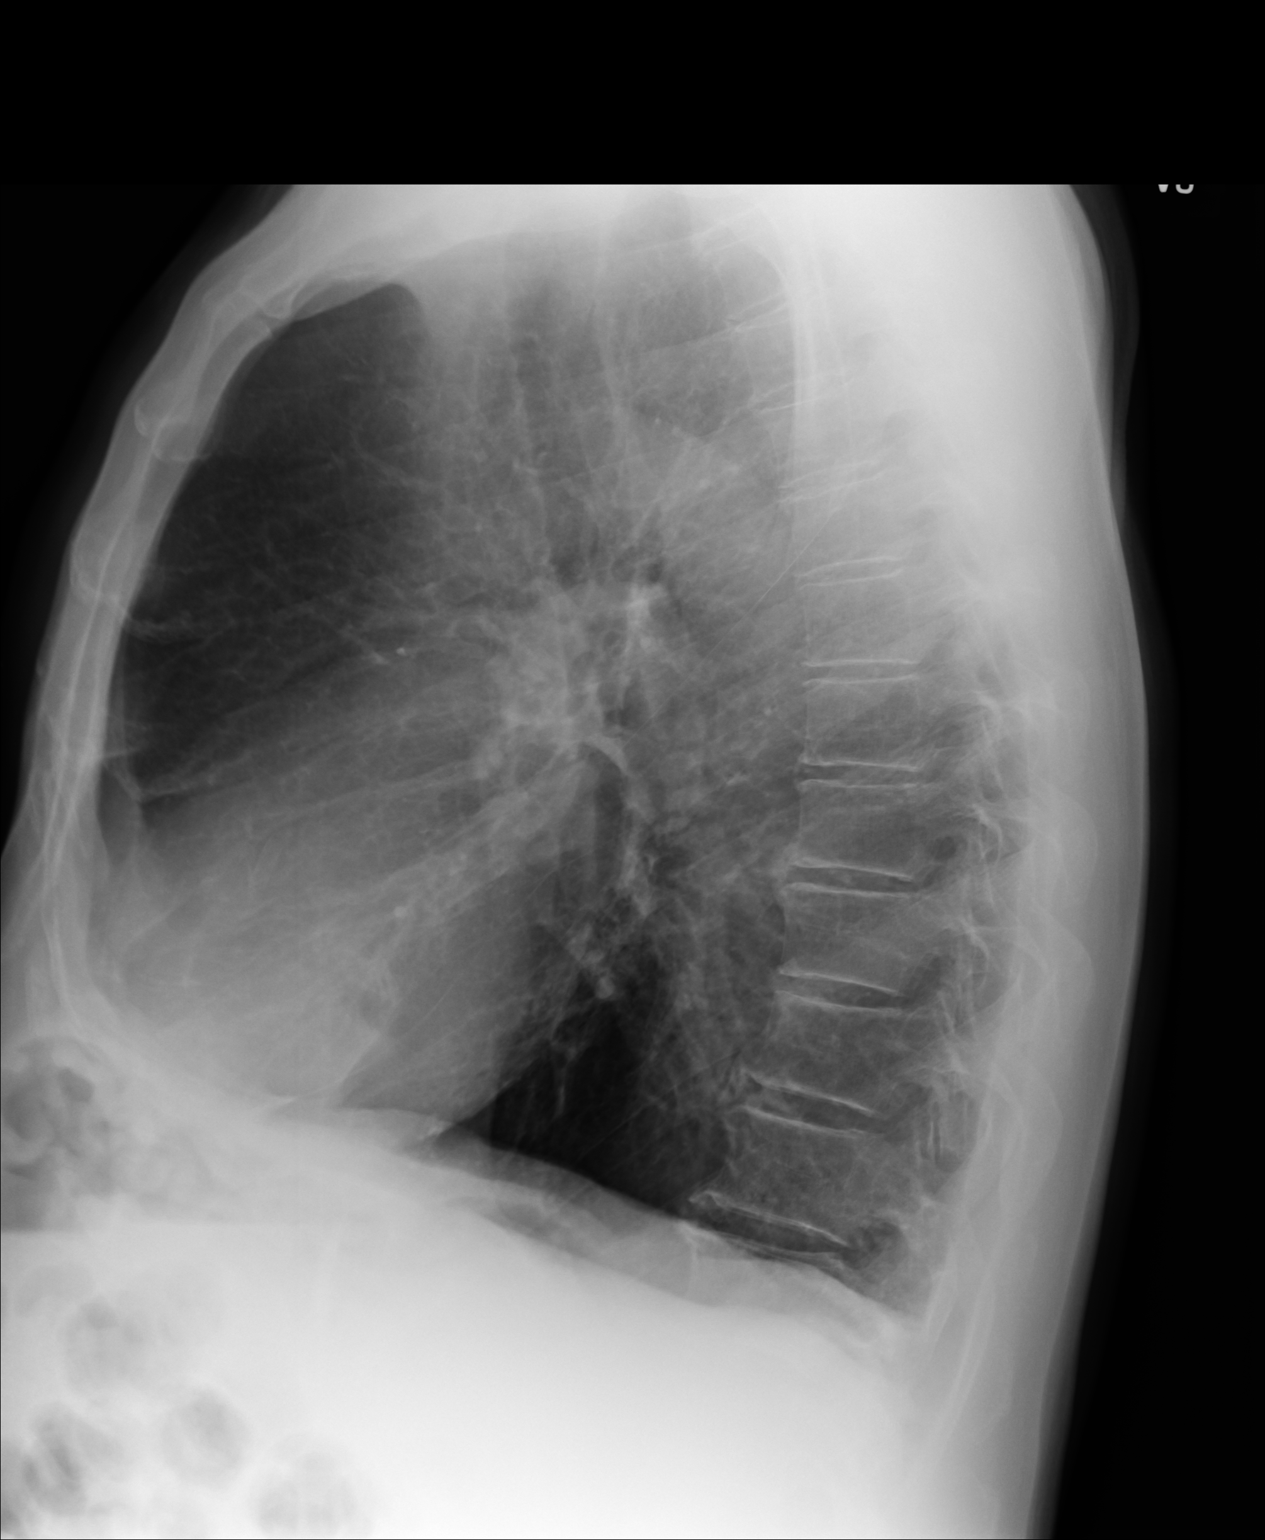

[2 of 2 positions shown; findings below may reference images not displayed]

PROCEDURE:     KDR - KDXR CHEST PA (OR AP) AND LAT  - [DATE]  [DATE]

RESULT:     Comparison is made to the study [DATE].

The lungs are hyperinflated with hemidiaphragm flattening. There is no focal
infiltrate. The cardiac silhouette is normal in size. The central pulmonary
vascularity is prominent though stable. The mediastinum is normal in width.
There is no pleural effusion. The bony thorax exhibits no acute abnormality.
IMPRESSION: There are findings consistent with COPD. There is no
evidence of CHF nor of pneumonia. I cannot exclude acute bronchitis in the
appropriate clinical setting.

[REDACTED]

## 2012-09-04 ENCOUNTER — Encounter: Payer: Self-pay | Admitting: Gastroenterology

## 2012-09-04 ENCOUNTER — Ambulatory Visit (AMBULATORY_SURGERY_CENTER): Payer: BC Managed Care – PPO | Admitting: Gastroenterology

## 2012-09-04 VITALS — BP 137/78 | HR 77 | Temp 97.0°F | Resp 20 | Ht 68.0 in | Wt 162.0 lb

## 2012-09-04 DIAGNOSIS — R197 Diarrhea, unspecified: Secondary | ICD-10-CM

## 2012-09-04 DIAGNOSIS — Z1211 Encounter for screening for malignant neoplasm of colon: Secondary | ICD-10-CM

## 2012-09-04 DIAGNOSIS — I8500 Esophageal varices without bleeding: Secondary | ICD-10-CM

## 2012-09-04 DIAGNOSIS — D126 Benign neoplasm of colon, unspecified: Secondary | ICD-10-CM

## 2012-09-04 DIAGNOSIS — R079 Chest pain, unspecified: Secondary | ICD-10-CM

## 2012-09-04 DIAGNOSIS — K219 Gastro-esophageal reflux disease without esophagitis: Secondary | ICD-10-CM

## 2012-09-04 MED ORDER — SODIUM CHLORIDE 0.9 % IV SOLN: 500.0000 mL | INTRAVENOUS | Status: DC

## 2012-09-04 NOTE — Patient Instructions (Addendum)
Discharge instructions given with verbal understanding. Handout on polyps given. Resume previous medications. YOU HAD AN ENDOSCOPIC PROCEDURE TODAY AT THE [facility]: Refer to the procedure report that was given to you for any specific questions about what was found during the examination.  If the procedure report does not answer your questions, please call your gastroenterologist to clarify.  If you requested that your care partner not be given the details of your procedure findings, then the procedure report has been included in a sealed envelope for you to review at your convenience later.  YOU SHOULD EXPECT: Some feelings of bloating in the abdomen. Passage of more gas than usual.  Walking can help get rid of the air that was put into your GI tract during the procedure and reduce the bloating. If you had a lower endoscopy (such as a colonoscopy or flexible sigmoidoscopy) you may notice spotting of blood in your stool or on the toilet paper. If you underwent a bowel prep for your procedure, then you may not have a normal bowel movement for a few days.  DIET: Your first meal following the procedure should be a light meal and then it is ok to progress to your normal diet.  A half-sandwich or bowl of soup is an example of a good first meal.  Heavy or fried foods are harder to digest and may make you feel nauseous or bloated.  Likewise meals heavy in dairy and vegetables can cause extra gas to form and this can also increase the bloating.  Drink plenty of fluids but you should avoid alcoholic beverages for 24 hours.  ACTIVITY: Your care partner should take you home directly after the procedure.  You should plan to take it easy, moving slowly for the rest of the day.  You can resume normal activity the day after the procedure however you should NOT DRIVE or use heavy machinery for 24 hours (because of the sedation medicines used during the test).    SYMPTOMS TO REPORT IMMEDIATELY: A  gastroenterologist can be reached at any hour.  During normal business hours, 8:30 AM to 5:00 PM Monday through Friday, call [phone].  After hours and on weekends, please call the GI answering service at [phone] who will take a message and have the physician on call contact you.   Following lower endoscopy (colonoscopy or flexible sigmoidoscopy):  Excessive amounts of blood in the stool  Significant tenderness or worsening of abdominal pains  Swelling of the abdomen that is new, acute  Fever of 100F or higher  Following upper endoscopy (EGD)  Vomiting of blood or coffee ground material  New chest pain or pain under the shoulder blades  Painful or persistently difficult swallowing  New shortness of breath  Fever of 100F or higher  Black, tarry-looking stools  FOLLOW UP: If any biopsies were taken you will be contacted by phone or by letter within the next 1-3 weeks.  Call your gastroenterologist if you have not heard about the biopsies in 3 weeks.  Our staff will call the home number listed on your records the next business day following your procedure to check on you and address any questions or concerns that you may have at that time regarding the information given to you following your procedure. This is a courtesy call and so if there is no answer at the home number and we have not heard from you through the emergency physician on call, we will assume that you have returned   to your regular daily activities without incident.  SIGNATURES/CONFIDENTIALITY: You and/or your care partner have signed paperwork which will be entered into your electronic medical record.  These signatures attest to the fact that that the information above on your After Visit Summary has been reviewed and is understood.  Full responsibility of the confidentiality of this discharge information lies with you and/or your care-partner. 

## 2012-09-04 NOTE — Op Note (Addendum)
[facility] [address] [place], [address]   ENDOSCOPY PROCEDURE REPORT  PATIENT: Leslie, Sims  MR#: [id number] BIRTHDATE: August 11, 1951 , 60  yrs. old GENDER: Male ENDOSCOPIST:Kelechi Astarita Hale Bogus, MD, [facility] REFERRED BY: Nilda Simmer, M.D. PROCEDURE DATE:  09/04/2012 PROCEDURE:   EGD w/ biopsy for H.pylori ASA CLASS:    Class III INDICATIONS: heartburn and Previous Rx.  for H.pylori. MEDICATION: There was residual sedation effect present from prior procedure and propofol (Diprivan) 200mg  IV TOPICAL ANESTHETIC:   Cetacaine Spray  DESCRIPTION OF PROCEDURE:   After the risks and benefits of the procedure were explained, informed consent was obtained.  The LB GIF-H180 [id number]  endoscope was introduced through the mouth  and advanced to the second portion of the duodenum .  The instrument was slowly withdrawn as the mucosa was fully examined.      The upper, middle and distal third of the esophagus were carefully inspected and no abnormalities were noted.  The z-line was well seen at the GEJ.  The endoscope was pushed into the fundus which was normal including a retroflexed view.  The antrum, gastric body, first and second part of the duodenum were unremarkable.   See pictures. CLO biopsy done.  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  The varix was not actively bleeding.    Retroflexed views revealed no abnormalities.    The scope was then withdrawn from the patient and the procedure completed.See pictures.  COMPLICATIONS: There were no complications.   ENDOSCOPIC IMPRESSION: 1.   Normal EGD 2.  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  RECOMMENDATIONS: 1.  continue current medications 2.  await pathology results 3.   w/u for cirrhosis needed.    _______________________________ eSignedMardella Layman, MD, [facility] 09/04/2012 3:15 PM   standard  discharge

## 2012-09-04 NOTE — Progress Notes (Signed)
Patient did not experience any of the following events: a burn prior to discharge; a fall within the facility; wrong site/side/patient/procedure/implant event; or a hospital transfer or hospital admission upon discharge from the facility. (G8907) Patient did not have preoperative order for IV antibiotic SSI prophylaxis. (G8918)  

## 2012-09-04 NOTE — Op Note (Signed)
[facility] [address] [place], [address]   COLONOSCOPY PROCEDURE REPORT  PATIENT: Leslie Sims, Leslie Sims  MR#: [id number] BIRTHDATE: 07-12-1951 , 60  yrs. old GENDER: Male ENDOSCOPIST: Mardella Layman, MD, [facility] REFERRED BY: PROCEDURE DATE:  09/04/2012 PROCEDURE:   Colonoscopy with snare polypectomy ASA CLASS:   Class III INDICATIONS:patient's personal history of adenomatous colon polyps.  MEDICATIONS: propofol (Diprivan) 300mg  IV  DESCRIPTION OF PROCEDURE:   After the risks and benefits and of the procedure were explained, informed consent was obtained.  A digital rectal exam revealed external hemorrhoids.    The LB CF-H180AL [id number]  endoscope was introduced through the anus and advanced to the cecum, which was identified by both the appendix and ileocecal valve .  The quality of the prep was adequate, using MoviPrep . The instrument was then slowly withdrawn as the colon was fully examined.     COLON FINDINGS: An innumerable number of small smooth flat polyps, ranging between 3-63mm in size, were found throughout the entire examined colon.  A polypectomy was performed using snare cautery. The resection was complete and the polyp tissue was partially retrieved.   Multiple hyperplastic nodules cauterized..see pictures.   More than 10 significent sized polyps removed. Retroflexed views revealed external hemorrhoids.     The scope was then withdrawn from the patient and the procedure completed.  COMPLICATIONS: There were no complications. ENDOSCOPIC IMPRESSION: 1.   Innumerable number of small flat polyps, ranging between 3-27mm in size, were found throughout the entire examined colon; polypectomy was performed using snare cautery 2.   Multiple hyperplastic nodules cauterized..see pictures 3.   More than 10 significent sized polyps removed.Marland KitchenMarland Kitchen?? [facility] SYNDROME HERE.  RECOMMENDATIONS:   REPEAT EXAM:  XB:JYNWGN Katrinka Blazing,  MD  _______________________________ eSigned:  Mardella Layman, MD, [facility] 09/04/2012 3:05 PM     PATIENT NAME:  Leslie Sims, Leslie Sims MR#: [id number]

## 2012-09-05 ENCOUNTER — Telehealth: Payer: Self-pay | Admitting: *Deleted

## 2012-09-05 LAB — HELICOBACTER PYLORI SCREEN-BIOPSY: UREASE: NEGATIVE

## 2012-09-05 NOTE — Telephone Encounter (Signed)
  Follow up Call-  Call back number 09/04/2012  Post procedure Call Back phone  # [phone]  Permission to leave phone message Yes     Patient questions:  Do you have a fever, pain , or abdominal swelling? no Pain Score  0 *  Have you tolerated food without any problems? yes  Have you been able to return to your normal activities? yes  Do you have any questions about your discharge instructions: Diet   no Medications  no Follow up visit  no  Do you have questions or concerns about your Care? no  Actions: * If pain score is 4 or above: No action needed, pain <4.

## 2012-09-06 ENCOUNTER — Encounter: Payer: Self-pay | Admitting: Gastroenterology

## 2012-09-06 ENCOUNTER — Ambulatory Visit: Payer: Self-pay | Admitting: General Surgery

## 2012-09-08 ENCOUNTER — Encounter: Payer: Self-pay | Admitting: Gastroenterology

## 2012-09-14 ENCOUNTER — Telehealth: Payer: Self-pay | Admitting: Gastroenterology

## 2012-09-14 DIAGNOSIS — I8500 Esophageal varices without bleeding: Secondary | ICD-10-CM

## 2012-09-15 NOTE — Telephone Encounter (Signed)
Informed pt of his path results, but he had received his letters. He was calling to schedule his u/s since he had surgery almost 2 weeks ago. lmom for pt and mailed him a letter with date and instructions.

## 2012-09-20 ENCOUNTER — Ambulatory Visit (HOSPITAL_COMMUNITY)
Admission: RE | Admit: 2012-09-20 | Discharge: 2012-09-20 | Disposition: A | Discharge status: Home / Self Care | Payer: BC Managed Care – PPO | Source: Ambulatory Visit | Attending: Gastroenterology | Admitting: Gastroenterology

## 2012-09-20 DIAGNOSIS — I8500 Esophageal varices without bleeding: Secondary | ICD-10-CM | POA: Insufficient documentation

## 2012-09-20 IMAGING — US US ABDOMEN COMPLETE
1 series · 14 of 25 positions shown · non-contrast
Comparison: None.

The *RADIOLOGY REPORT*
CLINICAL DATA: Esophageal varix on endoscopy

COMPLETE ABDOMINAL ULTRASOUND

[Series 1: us abdomen complete · 0.32mm/px · 14 of 80 slices shown]
[im 1/80]
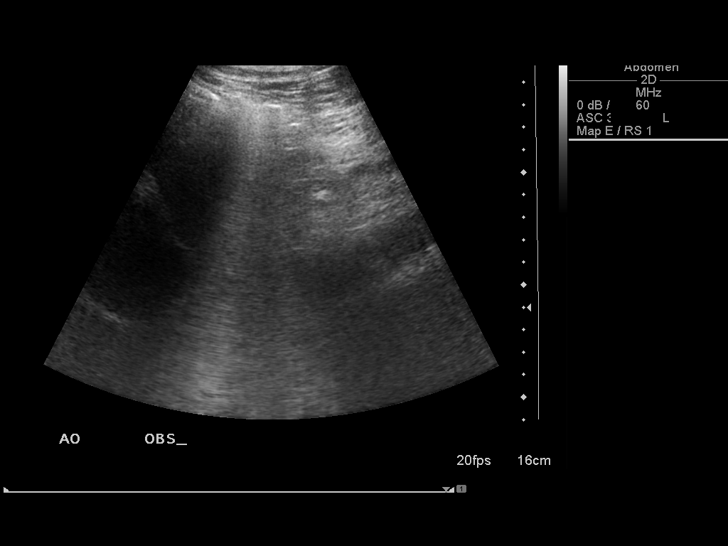
[im 7/80]
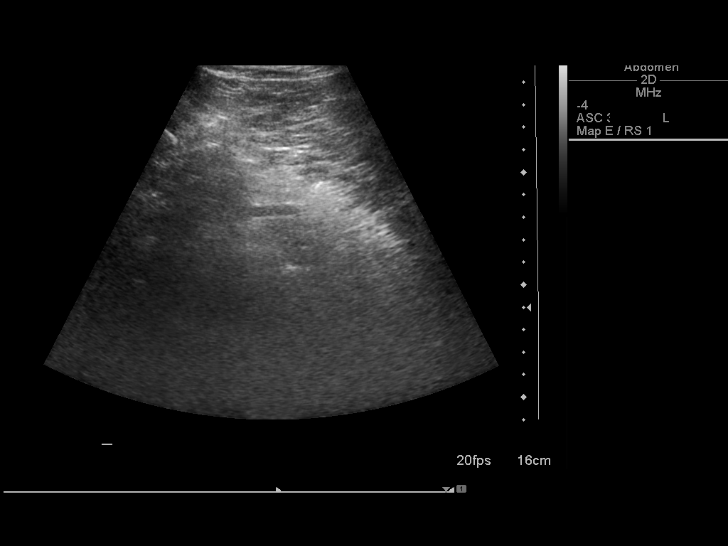
[im 14/80]
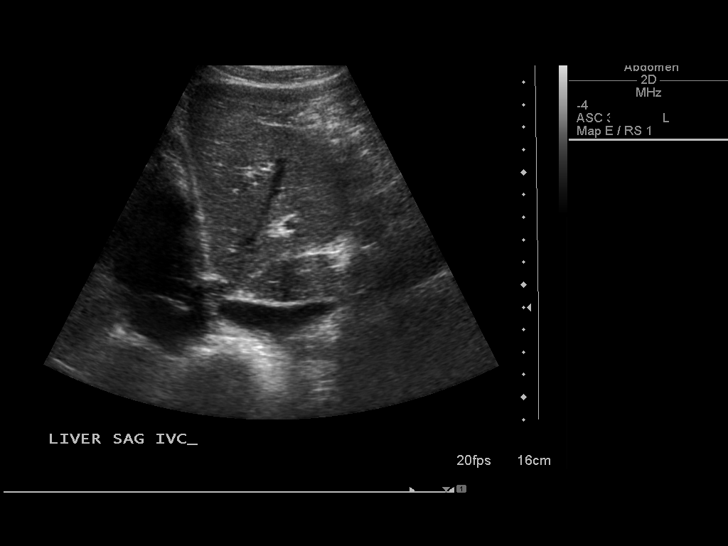
[im 20/80]
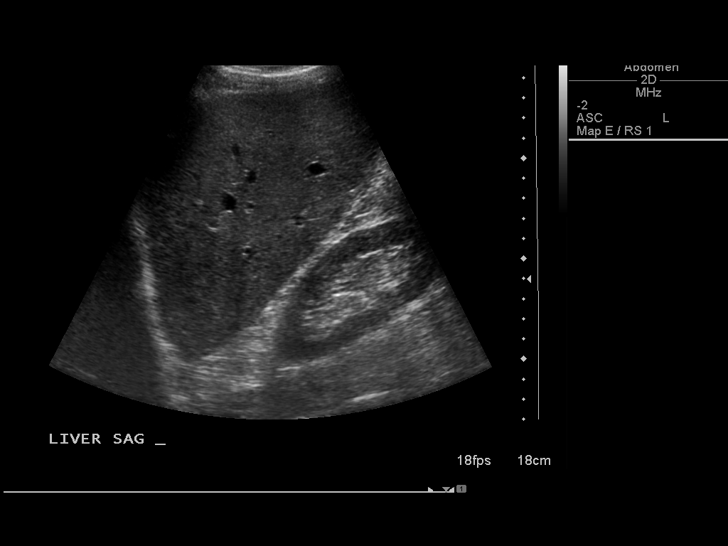
[im 27/80]
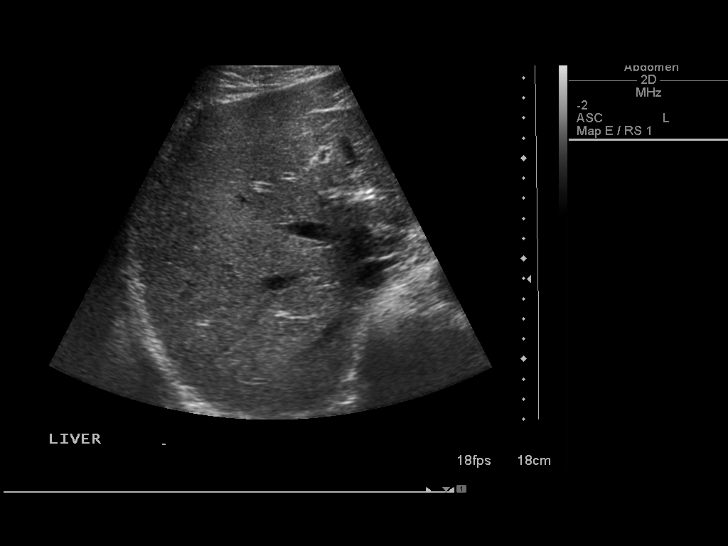
[im 30/80]
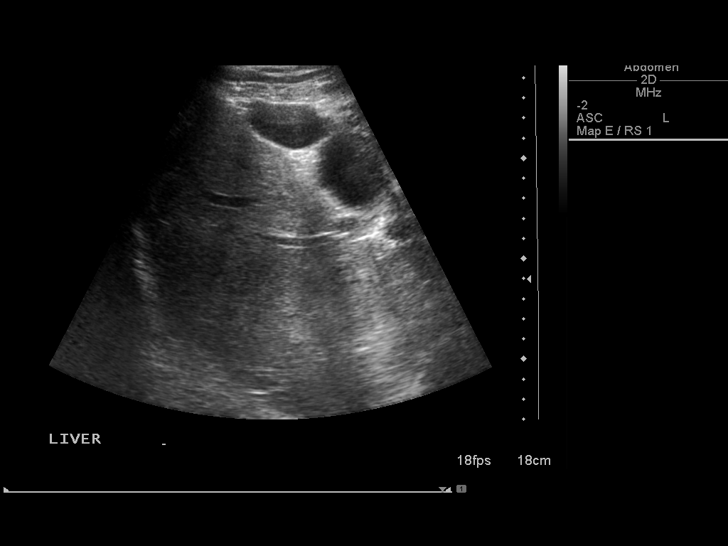
[im 37/80]
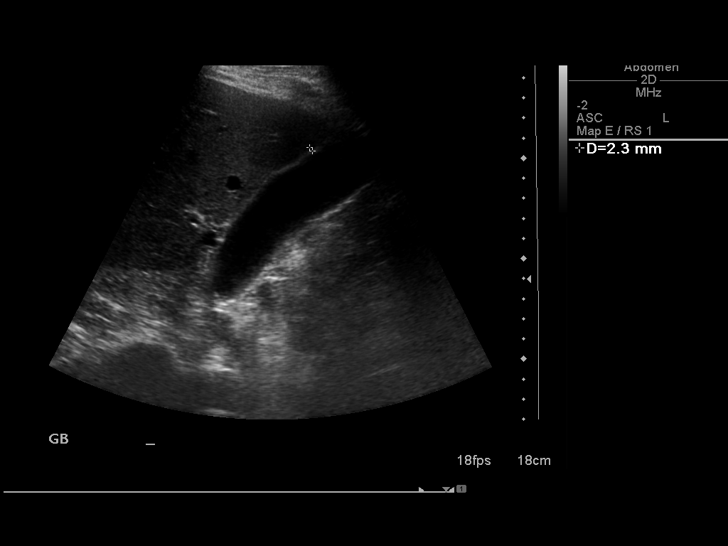
[im 43/80]
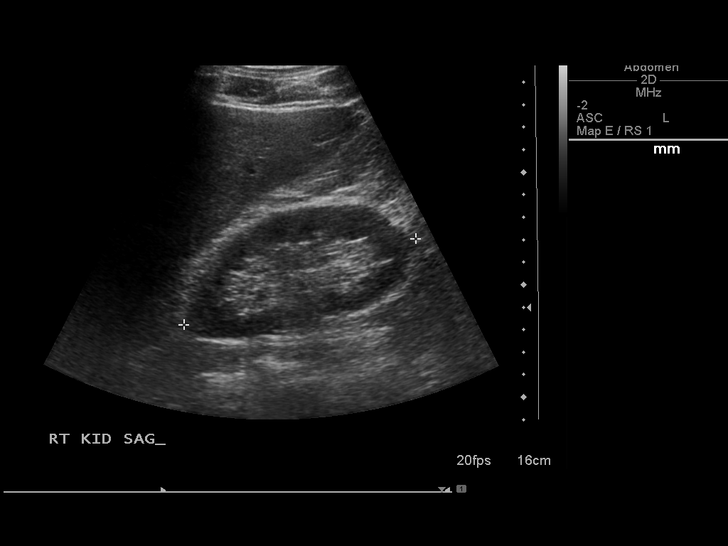
[im 50/80]
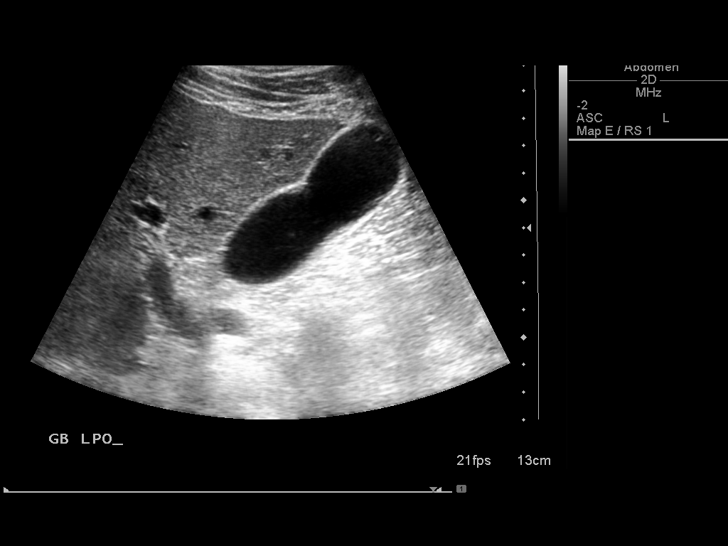
[im 53/80]
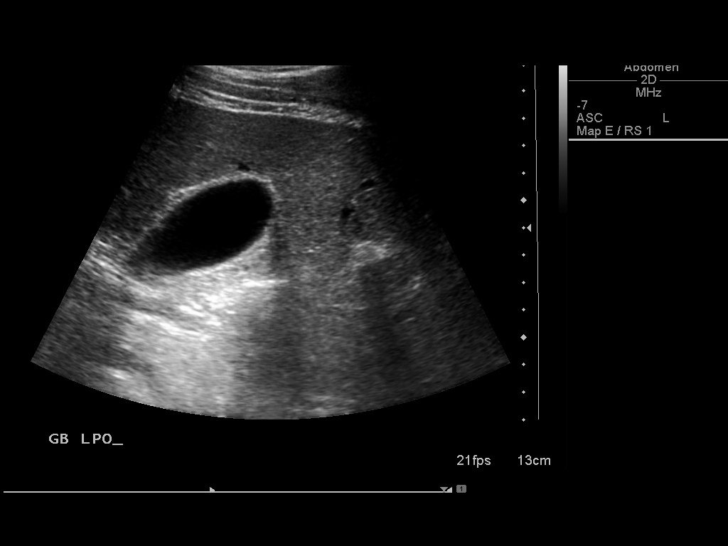
[im 60/80]
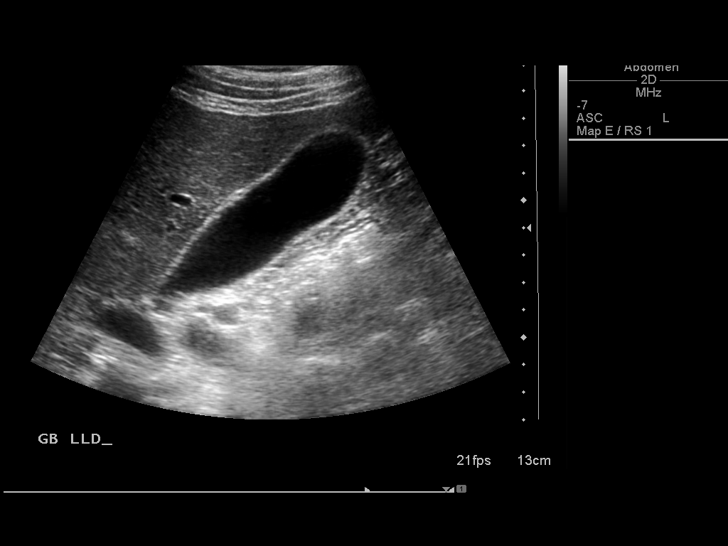
[im 66/80]
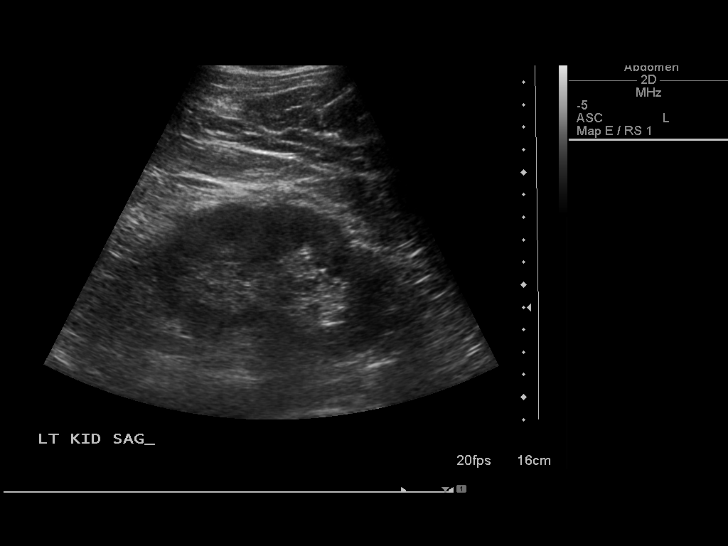
[im 73/80]
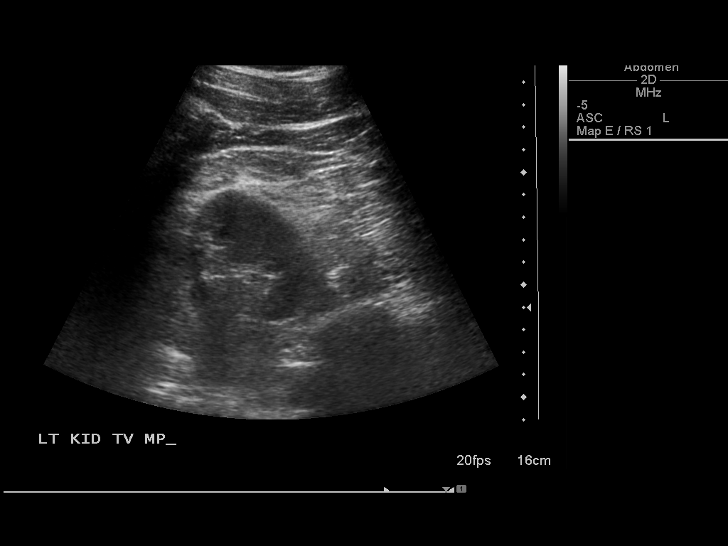
[im 80/80]
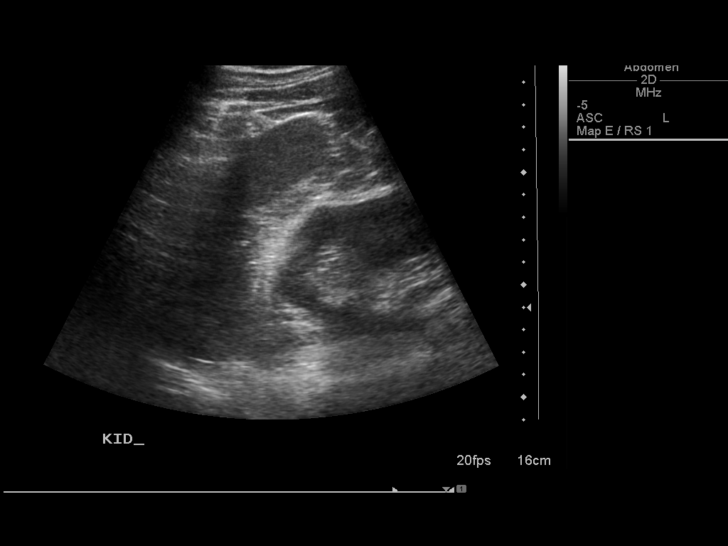

[14 of 25 positions shown; findings below may reference images not displayed]

FINDINGS: Gallbladder:  The gallbladder is visualized and no gallstones are
noted.  There is no pain over the gallbladder with compression.

Common bile duct:  The common bile duct is normal measuring 5.4 mm
in diameter.

Liver:  The liver has a normal echogenic pattern.  No ductal
dilatation is seen.

IVC:  Appears normal.

Pancreas:  The pancreas is partially obscured by bowel gas.

Spleen:  The spleen is normal measuring 8.5 cm sagittally.

Right Kidney:    No hydronephrosis is seen.  The right kidney
measures 11.0 cm sagittally.

Left Kidney:  No hydronephrosis is noted.  The left kidney measures
11.4 cm.

Abdominal aorta:   The abdominal aorta is obscured proximally but
no focal abdominal aortic aneurysm is seen.
IMPRESSION: Negative abdominal ultrasound.  However portions of the pancreas
are obscured by bowel gas.}.

## 2012-10-06 ENCOUNTER — Telehealth: Payer: Self-pay | Admitting: Gastroenterology

## 2012-10-09 NOTE — Telephone Encounter (Signed)
lmom for pt to call back

## 2012-10-10 NOTE — Telephone Encounter (Signed)
Informed pt his u/s was unremarkable; pt stated understanding.

## 2012-10-31 ENCOUNTER — Telehealth: Payer: Self-pay | Admitting: Pulmonary Disease

## 2012-10-31 NOTE — Telephone Encounter (Signed)
Called pt and left a message on machine x 3 to make next ov per recall.  Sent reminder letter on 10/31/12. Emily E McAlister  °

## 2012-11-07 ENCOUNTER — Ambulatory Visit: Payer: BC Managed Care – PPO | Admitting: Pulmonary Disease

## 2012-11-13 ENCOUNTER — Ambulatory Visit: Payer: BC Managed Care – PPO | Admitting: Cardiovascular Disease

## 2012-11-14 ENCOUNTER — Ambulatory Visit: Payer: BC Managed Care – PPO | Admitting: Cardiovascular Disease

## 2012-11-14 ENCOUNTER — Ambulatory Visit: Payer: BC Managed Care – PPO | Admitting: Pulmonary Disease

## 2012-11-14 ENCOUNTER — Encounter: Payer: Self-pay | Admitting: Pulmonary Disease

## 2012-11-14 ENCOUNTER — Ambulatory Visit (INDEPENDENT_AMBULATORY_CARE_PROVIDER_SITE_OTHER): Payer: BC Managed Care – PPO | Admitting: Pulmonary Disease

## 2012-11-14 VITALS — BP 112/70 | HR 81 | Temp 97.5°F | Ht 70.0 in | Wt 160.4 lb

## 2012-11-14 DIAGNOSIS — R059 Cough, unspecified: Secondary | ICD-10-CM

## 2012-11-14 DIAGNOSIS — J449 Chronic obstructive pulmonary disease, unspecified: Secondary | ICD-10-CM

## 2012-11-14 DIAGNOSIS — F17200 Nicotine dependence, unspecified, uncomplicated: Secondary | ICD-10-CM

## 2012-11-14 DIAGNOSIS — R05 Cough: Secondary | ICD-10-CM

## 2012-11-14 MED ORDER — DOXYCYCLINE HYCLATE 100 MG PO TABS: 100.0000 mg | ORAL_TABLET | Freq: Two times a day (BID) | ORAL | Status: AC

## 2012-11-14 NOTE — Assessment & Plan Note (Signed)
Discussed at length today.  He and his wife want to quit soon.  Plan: -advised to call 1-800-QUIT-NOW -offered lung cancer screening, he is going to think about it

## 2012-11-14 NOTE — Assessment & Plan Note (Signed)
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  Currently with increased sputum production, wheezing is at baseline after completing prednisone.  Plan: -doxycycline 100mg  po bid for one wee -continue inhalers -stop smoking! -flu shot is up to date

## 2012-11-14 NOTE — Progress Notes (Signed)
Subjective:    Patient ID: Leslie Sims, male    DOB: Nov 24, 1951, 61 y.o.   MRN: [id number]  Mr. Monds first saw the [facility] in August 2013 for COPD.  He has smoked 1.5 PPD for greater than 40 years and continued to smoke when we saw him.  His simple spirometry showed an FEV 1 1.15L (31% pred) and a CXR in 05/2012 showed emphysema.   HPI  11/14/2012 ROV -- Delta returns to clinic today stating that he was recently seen in urgent care facility for sinus congestion and shortness of breath. He was started on prednisone and loratadine as well as fluticasone and he says that this has helped some. He finished taking the prednisone today any shortness of breath and wheezing are a little better. He is still coughing green to brown sputum up on a regular basis. He denies fevers chills, chest pain, or weight loss. He is been a little stressed out lately because his wife had surgery and he's had to be taking care of her at home. He wants to quit smoking but as of now he is still smoking about a pack a day.   Past Medical History  Diagnosis Date  . Hyperlipidemia   . COPD (chronic obstructive pulmonary disease)   . Depression   . GERD (gastroesophageal reflux disease)   . Colon polyps 2008    colonoscopy  . Status post dilation of esophageal narrowing 2000  . Peptic ulcer   . Coronary artery disease 11/2009    Anterior MI with late presentation 11/2009. Cath showed a 100% proximal LAD occlusion, 99% mid RCA. Had a PCI and 2 DES to both LAD and RCA. EF was 35%. Nuclear stress test 05/13: prior anterior/inferior infarcts without ischemic, EF 43%  . Hypertension   . CARDIOMYOPATHY 03/24/2010    did not tolerate Toprol and Lisinopril.   . Insomnia   . Hemorrhoids   . MI (myocardial infarction)   . Atherosclerosis   . Anxiety   . Headache   . Tinea pedis   . Myalgia   . Helicobacter pylori (H. pylori)   . Thyroid dysfunction   . Chest pain   . Back pain   . Hernia       inguinal  . Allergic rhinitis   . Bronchitis     09-14-11  . Chronic airway obstruction, not elsewhere classified      Review of Systems  HENT: Positive for congestion, rhinorrhea and postnasal drip.   Respiratory: Positive for cough and wheezing. Negative for shortness of breath.   Cardiovascular: Negative for chest pain, palpitations and leg swelling.       Objective:   Physical Exam  Filed Vitals:   11/14/12 1547  BP: 112/70  Pulse: 81  Temp: 97.5 F (36.4 C)  TempSrc: Oral  Height: 5\' 10"  (1.778 m)  Weight: 160 lb 6.4 oz (72.757 kg)  SpO2: 98%    Gen: well appearing, no acute distress HEENT: NCAT, PERRL, EOMi, OP clear, neck supple without masses PULM: exp wheezing bilaterally CV: RRR, no mgr, no JVD AB: BS+, soft, nontender, no hsm Ext: warm, no edema, no clubbing, no cyanosis   June 2013 chest x-ray personally reviewed by me: Emphysema bilaterally  August 2013 simple spirometry: Ratio 45% FEV1 1.15 L (31% predicted)     Assessment & Plan:   COPD Severe COPD, 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  Currently with increased sputum production, wheezing is at baseline after completing prednisone.  Plan: -doxycycline 100mg  po bid for one wee -continue inhalers -stop smoking! -flu shot is up to date  SMOKER Discussed at length today.  He and his wife want to quit soon.  Plan: -advised to call 1-800-QUIT-NOW -offered lung cancer screening, he is going to think about it  Cough Certainly due to COPD and chronic bronchitis, but I think that chronic post nasal drip is contributing.  Plan: -continue flonase recently started (advised on proper use) -continue loratadine -add saline rinses   Updated Medication List Outpatient Encounter Prescriptions as of 11/14/2012  Medication Sig Dispense Refill  . albuterol (VENTOLIN HFA) 108 (90 BASE) MCG/ACT inhaler Inhale 2 puffs into the lungs every 6 (six) hours as needed. For wheezing      . aspirin 81 MG tablet  Take 81 mg by mouth daily.        [name] atorvastatin (LIPITOR) 20 MG tablet Take 1 tablet (20 mg total) by mouth daily.  30 tablet  1  . chlorpheniramine-HYDROcodone (TUSSIONEX) 10-8 MG/5ML LQCR       . clopidogrel (PLAVIX) 75 MG tablet Take 1 tablet (75 mg total) by mouth daily.  90 tablet  3  . esomeprazole (NEXIUM) 40 MG capsule Take 40 mg by mouth daily before breakfast.        . Fluticasone-Salmeterol (ADVAIR DISKUS) 500-50 MCG/DOSE AEPB Inhale 1 puff into the lungs every 12 (twelve) hours.        [name] levalbuterol (XOPENEX) 0.63 MG/3ML nebulizer solution Take 1 ampule by nebulization every 4 (four) hours as needed.        . loratadine (CLARITIN) 10 MG tablet Take 10 mg by mouth daily.      [name] LORazepam (ATIVAN) 0.5 MG tablet Take 0.5 mg by mouth at bedtime.      . nitroGLYCERIN (NITROSTAT) 0.4 MG SL tablet Place 1 tablet (0.4 mg total) under the tongue every 5 (five) minutes as needed.  25 tablet  3  . pramoxine-hydrocortisone (PROCTOCREAM-HC) 1-1 % rectal cream Place 1 application rectally as directed. hemorrhoids      . predniSONE (DELTASONE) 10 MG tablet Tapered dose as directed      . Pseudoephedrine-DM-GG (ROBITUSSIN COLD & COUGH PO) As directed per bottle when needed for cough      . sertraline (ZOLOFT) 50 MG tablet 50 mg. Takes 3 tablets daily.      . sucralfate (CARAFATE) 1 G tablet Take 1 g by mouth 4 (four) times daily as needed.       . tiotropium (SPIRIVA) 18 MCG inhalation capsule Place 18 mcg into inhaler and inhale daily.        [name] doxycycline (VIBRA-TABS) 100 MG tablet Take 1 tablet (100 mg total) by mouth 2 (two) times daily.  14 tablet  0  . [DISCONTINUED] levofloxacin (LEVAQUIN) 500 MG tablet

## 2012-11-14 NOTE — Patient Instructions (Signed)
Use Neil Med rinses with distilled water at least twice per day using the instructions on the package. 1/2 hour after using the [facility] Med rinse, use Flonase two puffs in each nostril once per day. Use loratidine and an over the counter decongestant (pseudophed or phenylephrine) as needed for the cough.  Call 1-800-QUIT-NOW to get free nicotine replacement  If you want a referral for the lung cancer screening study let us know  We will see you back in 3 months or sooner if needed

## 2012-11-14 NOTE — Assessment & Plan Note (Signed)
Certainly due to COPD and chronic bronchitis, but I think that chronic post nasal drip is contributing.  Plan: -continue flonase recently started (advised on proper use) -continue loratadine -add saline rinses

## 2012-11-21 ENCOUNTER — Ambulatory Visit (INDEPENDENT_AMBULATORY_CARE_PROVIDER_SITE_OTHER): Payer: BC Managed Care – PPO | Admitting: Cardiovascular Disease

## 2012-11-21 ENCOUNTER — Encounter: Payer: Self-pay | Admitting: Cardiovascular Disease

## 2012-11-21 VITALS — BP 130/80 | HR 83 | Ht 70.0 in | Wt 161.0 lb

## 2012-11-21 DIAGNOSIS — F17200 Nicotine dependence, unspecified, uncomplicated: Secondary | ICD-10-CM

## 2012-11-21 DIAGNOSIS — R0789 Other chest pain: Secondary | ICD-10-CM

## 2012-11-21 DIAGNOSIS — R0602 Shortness of breath: Secondary | ICD-10-CM

## 2012-11-21 DIAGNOSIS — I428 Other cardiomyopathies: Secondary | ICD-10-CM

## 2012-11-21 DIAGNOSIS — M79609 Pain in unspecified limb: Secondary | ICD-10-CM

## 2012-11-21 DIAGNOSIS — M79606 Pain in leg, unspecified: Secondary | ICD-10-CM

## 2012-11-21 DIAGNOSIS — I2510 Atherosclerotic heart disease of native coronary artery without angina pectoris: Secondary | ICD-10-CM

## 2012-11-21 NOTE — Assessment & Plan Note (Signed)
ABI was normal 

## 2012-11-21 NOTE — Patient Instructions (Addendum)
Continue same medications.   Follow up in 2 months.  

## 2012-11-21 NOTE — Assessment & Plan Note (Signed)
I again discussed with him my concerns about continuing smoking use especially with his heart disease and severe COPD.

## 2012-11-21 NOTE — Progress Notes (Signed)
HPI  This is a 61 year old male who is here today for a followup visit. He has known history of coronary artery disease status post anterior myocardial infarction in December of 2012. His LAD was occluded and RCA had a 99% stenosis. He had an angioplasty and drug-eluting stent placement to both LAD and RCA at that time.  In May of this year, He underwent a treadmill nuclear stress test which showed evidence of prior anterior and inferior infarcts without significant ischemia. Ejection fraction was 43%. He was hypertensive at baseline and had a hypertensive response to exercise with systolic blood pressure greater than 200 with 2 mm ST depression with exercise. He was started on small dose Toprol and lisinopril but did not tolerate both medications due to dizziness, fatigue and lower extremity discomfort. . An ABI  was normal. He sees Dr. Kendrick Fries for severe COPD. Unfortunately, he continues to smoke. The patient had more episodes of substernal chest tightness lately some of these episodes were exertional and lasted for about 5-10 minutes. He is having increased exertional dyspnea as well.   Allergies  Allergen Reactions  . Wellbutrin (Bupropion) Other (See Comments)    Unknown/"made me feel real funny"     Current Outpatient Prescriptions on File Prior to Visit  Medication Sig Dispense Refill  . albuterol (VENTOLIN HFA) 108 (90 BASE) MCG/ACT inhaler Inhale 2 puffs into the lungs every 6 (six) hours as needed. For wheezing      . aspirin 81 MG tablet Take 81 mg by mouth daily.        [name] atorvastatin (LIPITOR) 20 MG tablet Take 1 tablet (20 mg total) by mouth daily.  30 tablet  1  . chlorpheniramine-HYDROcodone (TUSSIONEX) 10-8 MG/5ML LQCR       . clopidogrel (PLAVIX) 75 MG tablet Take 1 tablet (75 mg total) by mouth daily.  90 tablet  3  . doxycycline (VIBRA-TABS) 100 MG tablet Take 1 tablet (100 mg total) by mouth 2 (two) times daily.  14 tablet  0  . esomeprazole (NEXIUM) 40 MG capsule Take  40 mg by mouth daily before breakfast.        . fluticasone (VERAMYST) 27.5 MCG/SPRAY nasal spray Place 2 sprays into the nose daily.      . Fluticasone-Salmeterol (ADVAIR DISKUS) 500-50 MCG/DOSE AEPB Inhale 1 puff into the lungs every 12 (twelve) hours.        [name] levalbuterol (XOPENEX) 0.63 MG/3ML nebulizer solution Take 1 ampule by nebulization every 4 (four) hours as needed.        [name] LORazepam (ATIVAN) 0.5 MG tablet Take 0.5 mg by mouth at bedtime.      . nitroGLYCERIN (NITROSTAT) 0.4 MG SL tablet Place 1 tablet (0.4 mg total) under the tongue every 5 (five) minutes as needed.  25 tablet  3  . pramoxine-hydrocortisone (PROCTOCREAM-HC) 1-1 % rectal cream Place 1 application rectally as directed. hemorrhoids      . Pseudoephedrine-DM-GG (ROBITUSSIN COLD & COUGH PO) As directed per bottle when needed for cough      . sertraline (ZOLOFT) 50 MG tablet Take 2  tablets daily.      . sucralfate (CARAFATE) 1 G tablet Take 1 g by mouth 4 (four) times daily as needed.       . tiotropium (SPIRIVA) 18 MCG inhalation capsule Place 18 mcg into inhaler and inhale daily.           Past Medical History  Diagnosis Date  . Hyperlipidemia   .  COPD (chronic obstructive pulmonary disease)   . Depression   . GERD (gastroesophageal reflux disease)   . Colon polyps 2008    colonoscopy  . Status post dilation of esophageal narrowing 2000  . Peptic ulcer   . Coronary artery disease 11/2009    Anterior MI with late presentation 11/2009. Cath showed a 100% proximal LAD occlusion, 99% mid RCA. Had a PCI and 2 DES to both LAD and RCA. EF was 35%. Nuclear stress test 05/13: prior anterior/inferior infarcts without ischemic, EF 43%  . Hypertension   . CARDIOMYOPATHY 03/24/2010    did not tolerate Toprol and Lisinopril.   . Insomnia   . Hemorrhoids   . MI (myocardial infarction)   . Atherosclerosis   . Anxiety   . Headache   . Tinea pedis   . Myalgia   . Helicobacter pylori (H. pylori)   . Thyroid dysfunction     . Chest pain   . Back pain   . Hernia     inguinal  . Allergic rhinitis   . Bronchitis     09-14-11  . Chronic airway obstruction, not elsewhere classified      Past Surgical History  Procedure Date  . Penile prosthesis implant   . Esophagogastroduodenoscopy 2008  . Colonoscopy   . Esophageal dilation   . Cardiac catheterization   . Coronary angioplasty with stent placement 09/2009    LAD 3.0 X23 mm Xience DES, RCA: 4.0 X 15 mm Xience DES     Family History  Problem Relation Age of Onset  . Heart attack Brother     Brother #1  . Diabetes Brother     brother #2  . Hypertension Brother     #3  . Coronary artery disease Father 4    deceased  . COPD Mother 53    deceased  . Heart attack Sister 67    acute-cocaine abuse/deceased  . Heart attack Brother 18    #4 deceased  . Diabetes Father      History   Social History  . Marital Status: Married    Spouse Name: N/A    Number of Children: 5  . 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 Smoking status: Current Every Day Smoker -- 1.5 packs/day for 42 years    Types: Cigarettes  . Smokeless tobacco: Never Used  . Alcohol Use: No  . Drug Use: No  . Sexually Active: Not on file   Other Topics Concern  . Not on file   Social History Narrative   1 stepchild and 15 grandchildren      PHYSICAL EXAM   BP 130/80  Pulse 83  Ht 5\' 10"  (1.778 m)  Wt 161 lb (73.029 kg)  BMI 23.10 kg/m2 Constitutional: He is oriented to person, place, and time. He appears well-developed and well-nourished. No distress.  HENT: No nasal discharge.  Head: Normocephalic and atraumatic.  Eyes: Pupils are equal and round. Right eye exhibits no discharge. Left eye exhibits no discharge.  Neck: Normal range of motion. Neck supple. No JVD present. No thyromegaly present.  Cardiovascular: Normal rate, regular rhythm, normal heart sounds and. Exam reveals no gallop  and no friction rub. No murmur heard.  Pulmonary/Chest: Effort normal and diminished breath sounds. No stridor. No respiratory distress. He has no wheezes. He has no rales. He exhibits no tenderness.  Abdominal: Soft. Bowel sounds are normal. He exhibits no  distension. There is no tenderness. There is no rebound and no guarding.  Musculoskeletal: Normal range of motion. He exhibits no edema and no tenderness.  Neurological: He is alert and oriented to person, place, and time. Coordination normal.  Skin: Skin is warm and dry. No rash noted. He is not diaphoretic. No erythema. No pallor.  Psychiatric: He has a normal mood and affect. His behavior is normal. Judgment and thought content normal.       EKG: Sinus  Rhythm  -Poor R-wave progression -old anteroseptal infarct  -  Nonspecific T-abnormality.   ABNORMAL    ASSESSMENT AND PLAN

## 2012-11-21 NOTE — Assessment & Plan Note (Signed)
The patient is having increased symptoms of chest tightness and dyspnea with minimal activities. I suspect that his dyspnea is mostly related to COPD. However, his frequent chest discomfort is concerning. Most recent stress test early this year showed no clear evidence of ischemia on nuclear imaging. However, there was a fixed anterior and inferior defects due to prior infarcts. He did have significant ST depression with exercise as well as hypertensive response. I believe the best option is to proceed with cardiac catheterization to reevaluate his ischemic heart disease. I discussed this with him and he wants to wait until after the new year. I asked him to notify me if his symptoms worsen. Otherwise would continue medical therapy and have him followup in 2 months with me.

## 2012-11-21 NOTE — Assessment & Plan Note (Signed)
He did not tolerate treatment with Toprol likely due to his lung disease. He also did not tolerate lisinopril but that was given at the same time with Toprol. Given his reduced LV systolic function, I will consider starting him again on an ACE inhibitor without beta blocker.

## 2012-12-04 ENCOUNTER — Telehealth: Payer: Self-pay | Admitting: Pulmonary Disease

## 2012-12-04 NOTE — Telephone Encounter (Signed)
Spoke with pt. He states that for the past several days having increased nasal congestion and sinus pressure/HA. He has had some cough- prod with minimal clear to light yellow sputum. He has been taking loratidine, nasal washes and flonase with only little relief. He has had no changes in his breathing. Denies CP or wheeze. He is requesting rx for doxy to be called in. Okay with a call back tomorrow. Please advise, thanks! Allergies  Allergen Reactions  . Wellbutrin (Bupropion) Other (See Comments)    Unknown/"made me feel real funny"

## 2012-12-04 NOTE — Telephone Encounter (Signed)
LMTCB

## 2012-12-05 NOTE — Telephone Encounter (Signed)
Please let him know that it's not a great idea for him to be on antibiotics again because of antibiotic resistance, risk of c.diff, etc.  He should use over the counter decongestants (mucinex, pseudophed) in addition to his current nasal sprays and rinses.  If no improvement or fever or chills he should see [place] in the office.

## 2012-12-05 NOTE — Telephone Encounter (Signed)
Spoke with pt and notified of recs per BQ He verbalized understanding and denied any questions or further needs

## 2012-12-06 ENCOUNTER — Encounter: Payer: Self-pay | Admitting: Family Medicine

## 2012-12-06 ENCOUNTER — Ambulatory Visit (INDEPENDENT_AMBULATORY_CARE_PROVIDER_SITE_OTHER): Payer: BC Managed Care – PPO | Admitting: Family Medicine

## 2012-12-06 ENCOUNTER — Ambulatory Visit: Payer: BC Managed Care – PPO

## 2012-12-06 VITALS — BP 122/78 | HR 93 | Temp 98.1°F | Resp 16 | Ht 69.0 in | Wt 159.4 lb

## 2012-12-06 DIAGNOSIS — J449 Chronic obstructive pulmonary disease, unspecified: Secondary | ICD-10-CM

## 2012-12-06 DIAGNOSIS — J9801 Acute bronchospasm: Secondary | ICD-10-CM

## 2012-12-06 DIAGNOSIS — R059 Cough, unspecified: Secondary | ICD-10-CM

## 2012-12-06 DIAGNOSIS — R05 Cough: Secondary | ICD-10-CM

## 2012-12-06 DIAGNOSIS — J209 Acute bronchitis, unspecified: Secondary | ICD-10-CM

## 2012-12-06 LAB — POCT INFLUENZA A/B: Influenza A, POC: NEGATIVE

## 2012-12-06 IMAGING — CR DG CHEST 2V
3 series · 3 of 3 positions shown · non-contrast
Comparison: [DATE]

CLINICAL DATA: Cough, shortness of breath

CHEST - 2 VIEW

[PA (1 of 2)]
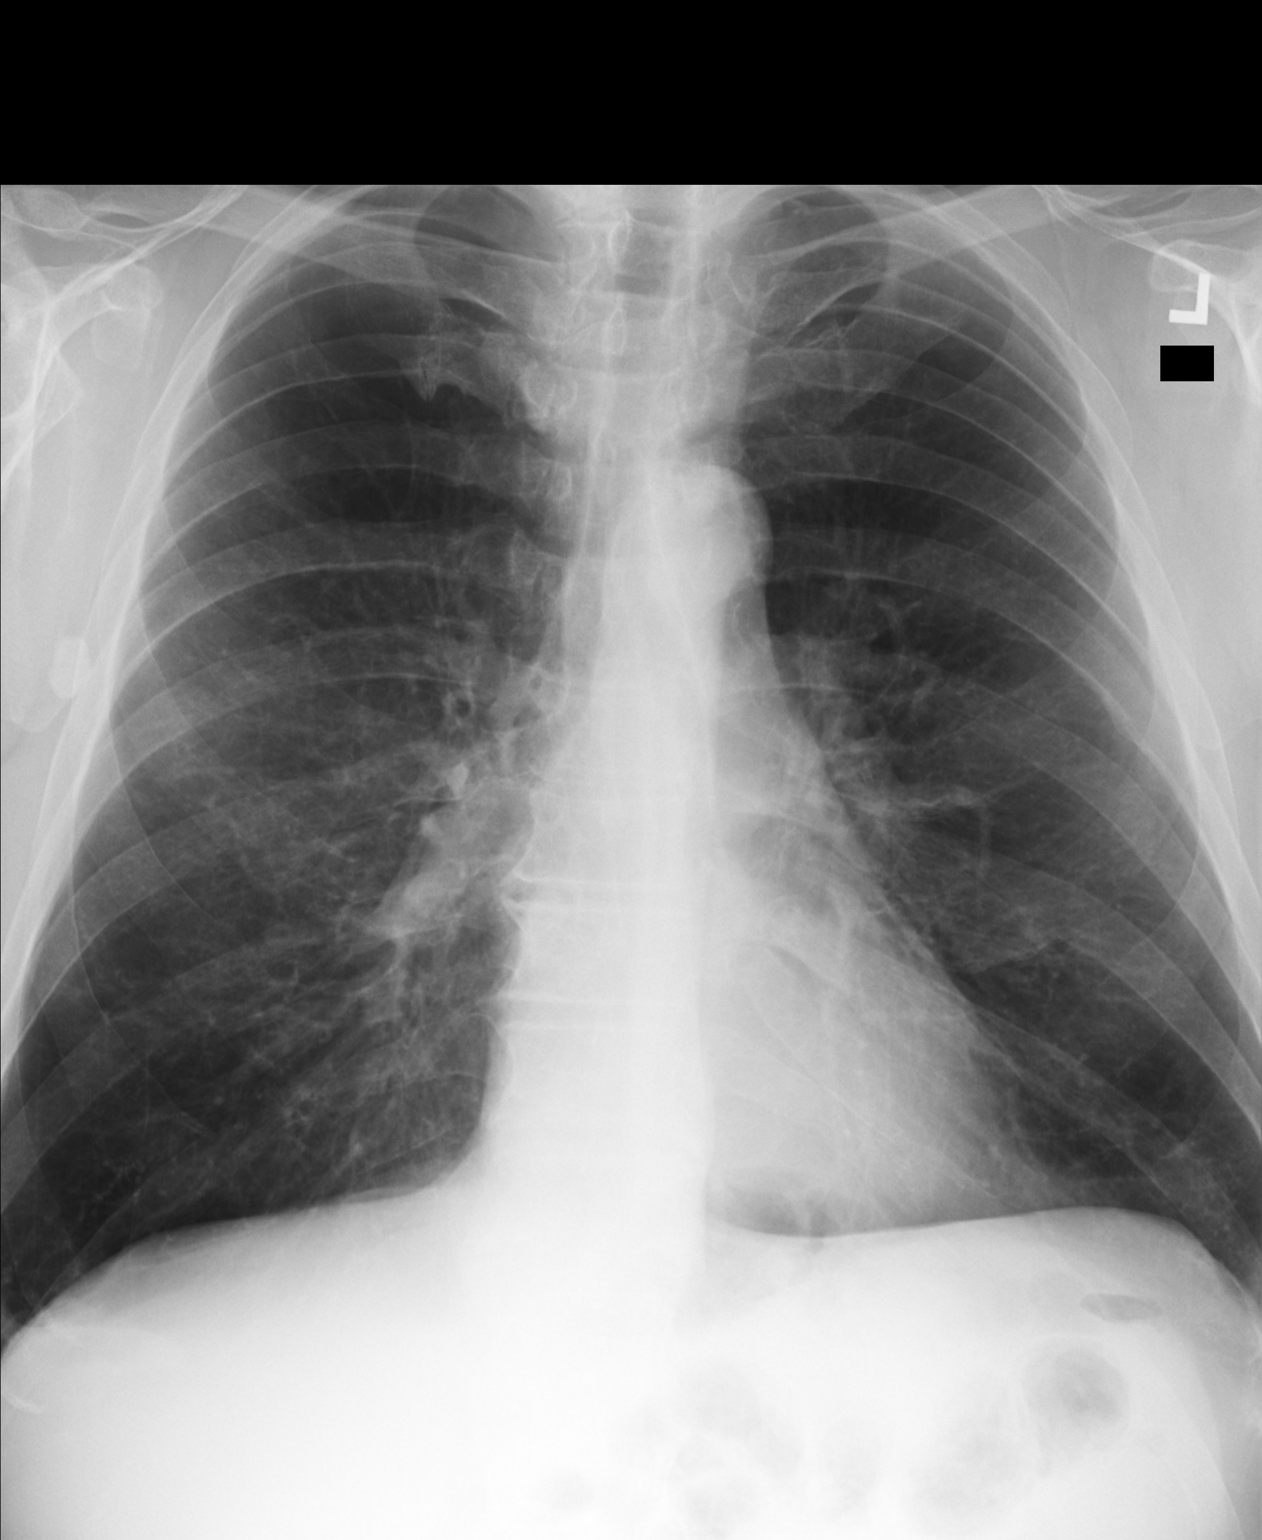

[lateral]
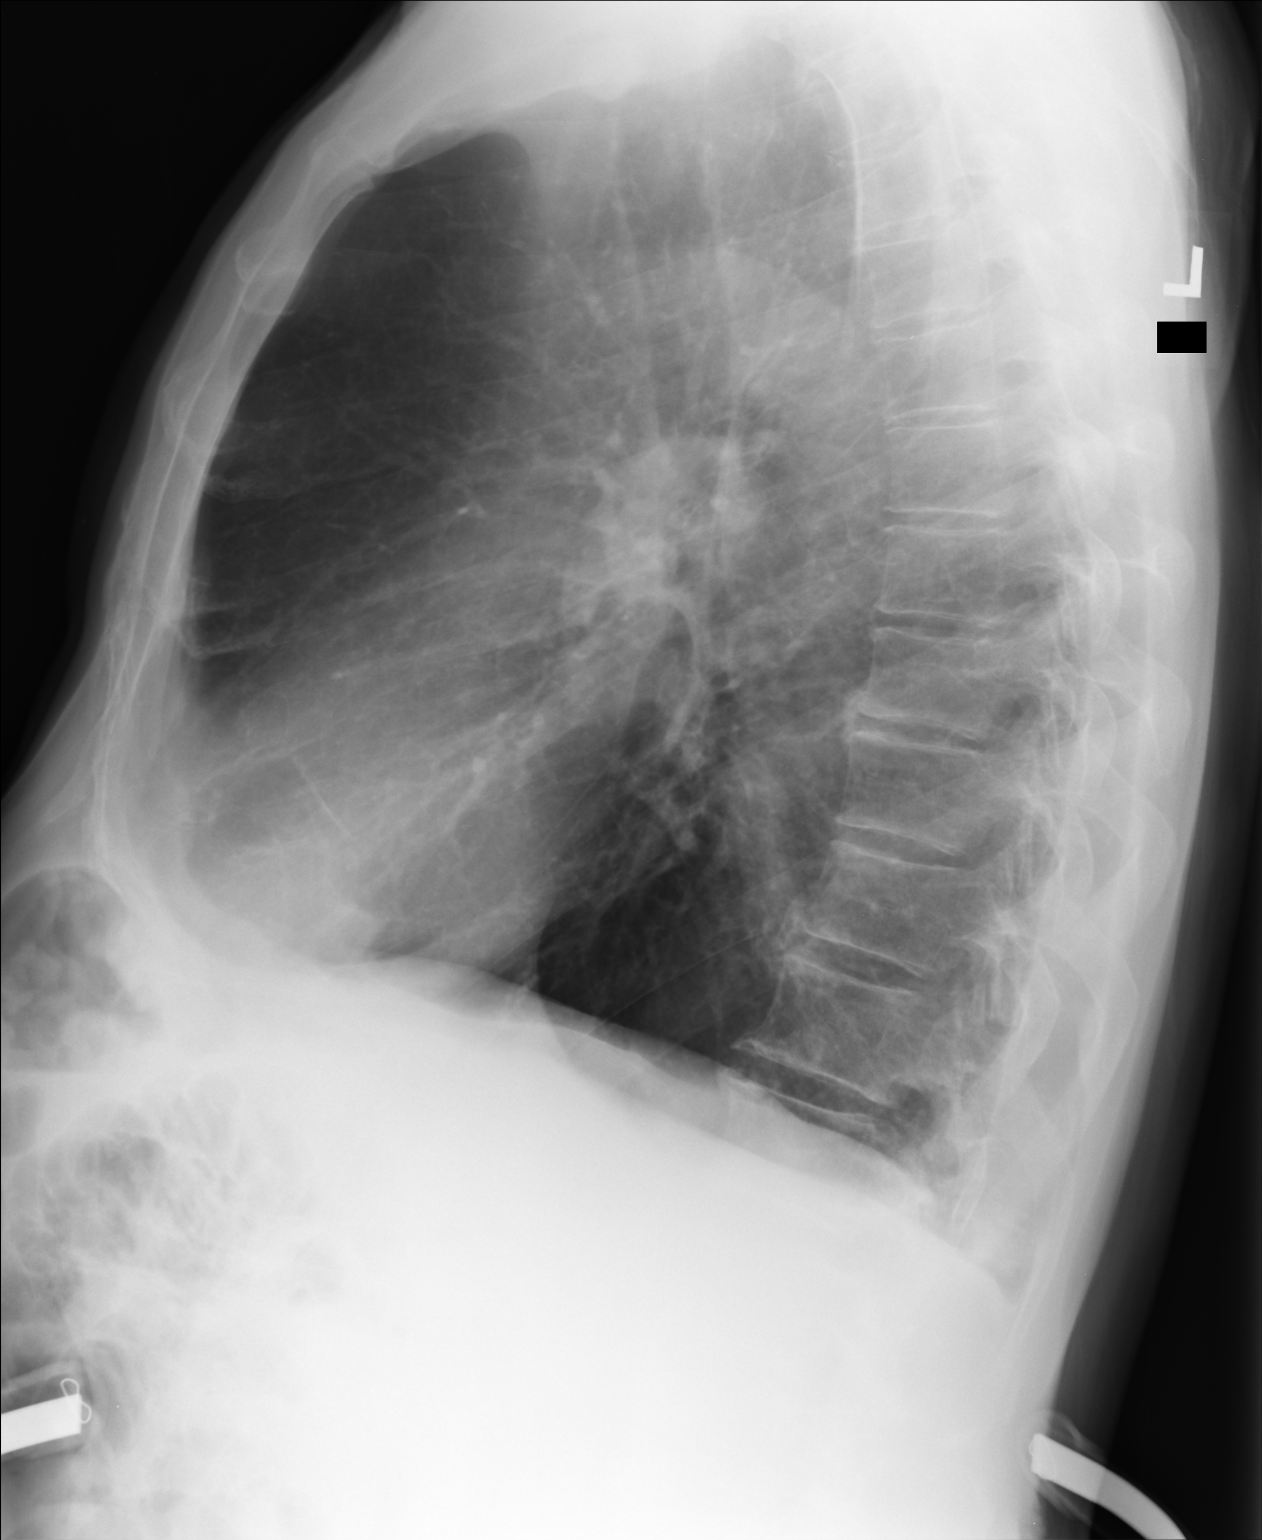

[PA (2 of 2)]
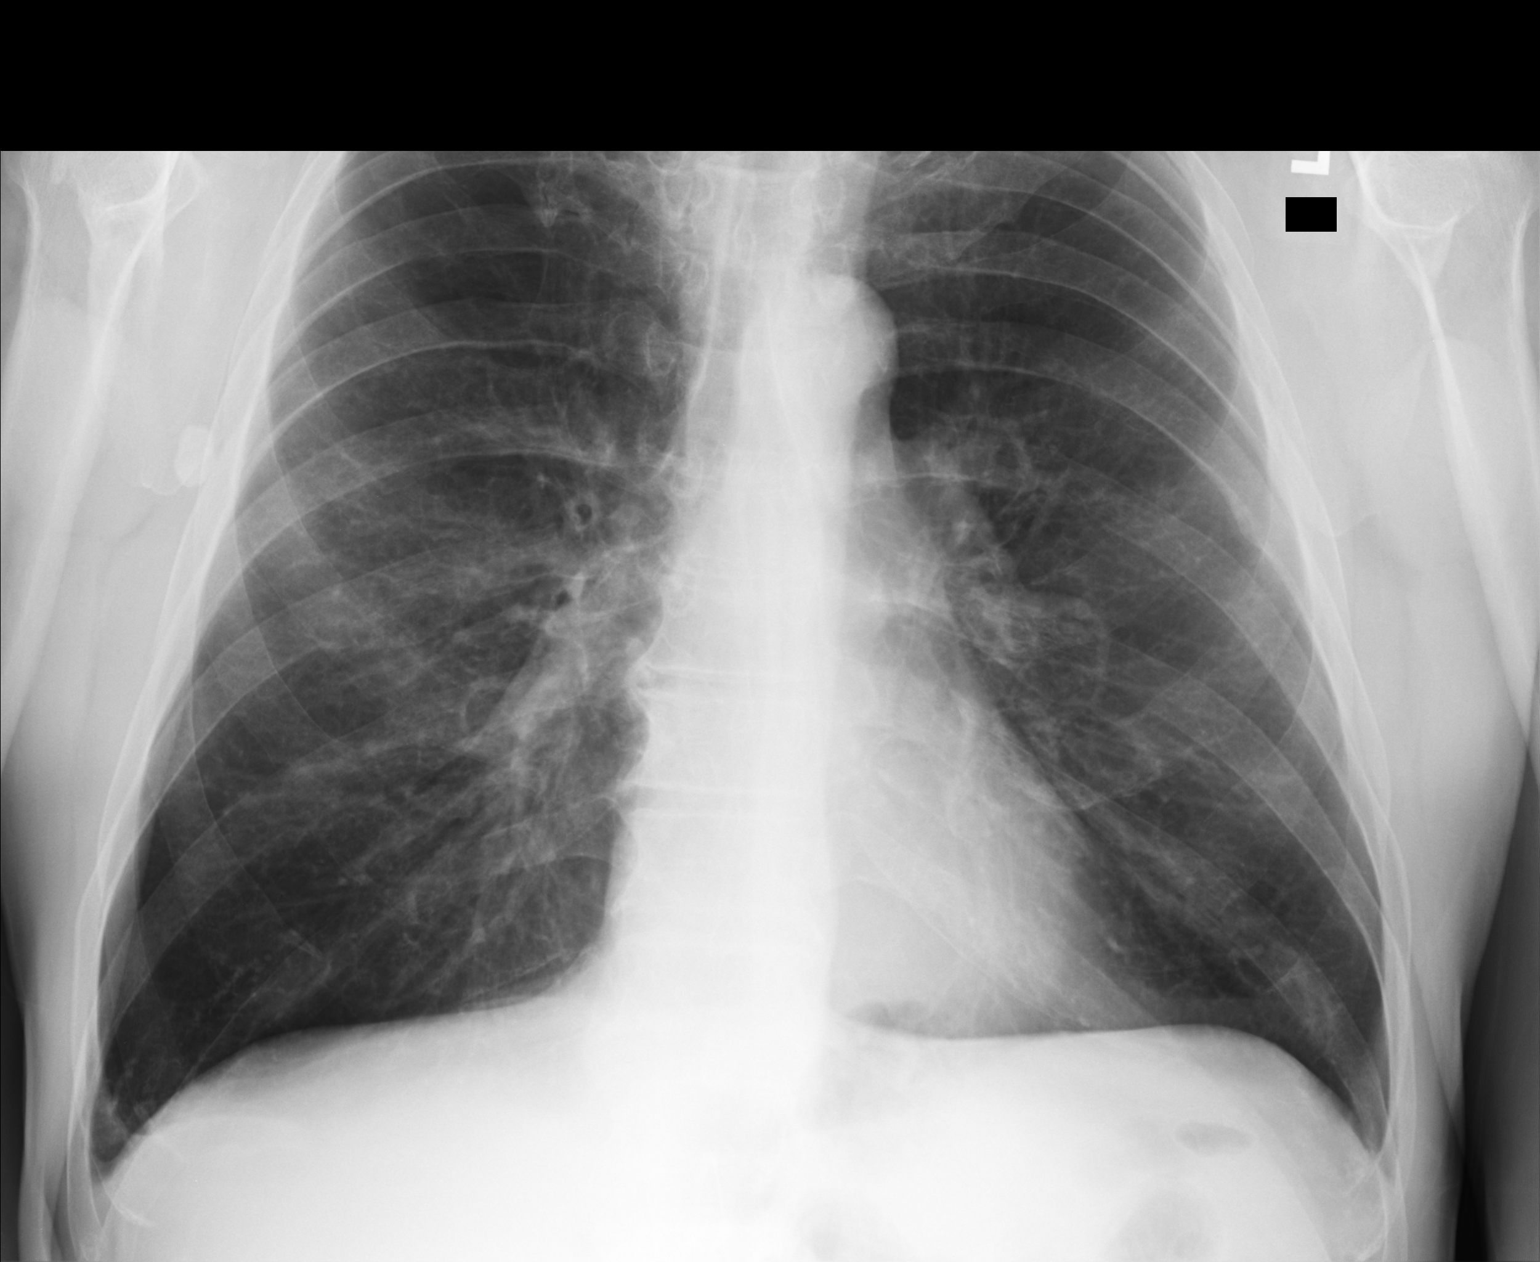

[3 of 3 positions shown; findings below may reference images not displayed]

FINDINGS: Chronic background COPD/emphysema noted.  Stable heart
size and vascularity.  Healed left rib fractures.  Stable right
axillary calcification.  No superimposed pneumonia, collapse,
consolidation, edema, effusion, or pneumothorax.
IMPRESSION: Stable COPD/emphysema.  No acute process.

## 2012-12-06 MED ORDER — LEVOFLOXACIN 500 MG PO TABS: 500.0000 mg | ORAL_TABLET | Freq: Every day | ORAL | Status: DC

## 2012-12-06 MED ORDER — PREDNISONE 20 MG PO TABS: ORAL_TABLET | ORAL | Status: DC

## 2012-12-06 NOTE — Progress Notes (Signed)
[address]   [place], [place]  [address]   [phone]  Subjective:    Patient ID: Leslie Sims, male    DOB: 1951/06/20, 61 y.o.   MRN: [id number]  HPIThis 61 y.o. male presents for evaluation of SOB,nasal congestion.  Onset four days ago. +Head congestion; +chest is full; +SOB.  +Sleeping all day.  Low grade fever; +sweats; no chills.  +HA. No ear pain; no ST; +rhinorrhea; using saline wash.  +nasal congestion.  +cough; +light green; using Advair twice daily; using Spiriva once daily.  Using rescue inhaler 6-8 times per day.  Prescribed nebulizer; medication not working well; has not used nebulizer in past 48 hours.  Not using nebulizer machine much.  Taking Mucinex, Rotibussin.  Called pulmonologist yesterday and requested antibiotic/Doxycycline but was denied; recommended supportive care with Mucinex, nasal saline.  Using Loratine, Flonase daily as recommended by Sharen Hint. Pulmonologist refused antibiotic.  S/p flu vaccine this season.     Review of Systems  Constitutional: Positive for fever, chills and fatigue. Negative for diaphoresis.  HENT: Positive for congestion, rhinorrhea, postnasal drip and sinus pressure. Negative for ear pain, sore throat and sneezing.   Respiratory: Positive for cough, shortness of breath and wheezing. Negative for stridor.   Cardiovascular: Negative for chest pain, palpitations and leg swelling.  Gastrointestinal: Negative for nausea, vomiting and diarrhea.  Skin: Negative for rash.  Neurological: Positive for headaches. Negative for dizziness and light-headedness.        Past Medical History  Diagnosis Date  . Hyperlipidemia   . COPD (chronic obstructive pulmonary disease)   . Depression   . GERD (gastroesophageal reflux disease)   . Colon polyps 2008    colonoscopy  . Status post dilation of esophageal narrowing 2000  . Peptic ulcer   . Coronary artery disease 11/2009    Anterior MI with late presentation 11/2009. Cath showed a 100%  proximal LAD occlusion, 99% mid RCA. Had a PCI and 2 DES to both LAD and RCA. EF was 35%. Nuclear stress test 05/13: prior anterior/inferior infarcts without ischemic, EF 43%  . Hypertension   . CARDIOMYOPATHY 03/24/2010    did not tolerate Toprol and Lisinopril.   . Insomnia   . Hemorrhoids   . MI (myocardial infarction)   . Atherosclerosis   . Anxiety   . Headache   . Tinea pedis   . Myalgia   . Helicobacter pylori (H. pylori)   . Thyroid dysfunction   . Chest pain   . Back pain   . Hernia     inguinal  . Allergic rhinitis   . Bronchitis     09-14-11  . Chronic airway obstruction, not elsewhere classified     Past Surgical History  Procedure Date  . Penile prosthesis implant   . Esophagogastroduodenoscopy 2008  . Colonoscopy   . Esophageal dilation   . Cardiac catheterization   . Coronary angioplasty with stent placement 09/2009    LAD 3.0 X23 mm Xience DES, RCA: 4.0 X 15 mm Xience DES    Prior to Admission medications   Medication Sig Start Date End Date Taking? Authorizing Provider  albuterol (VENTOLIN HFA) 108 (90 BASE) MCG/ACT inhaler Inhale 2 puffs into the lungs every 6 (six) hours as needed. For wheezing   Yes Historical Provider, MD  aspirin 81 MG tablet Take 81 mg by mouth daily.     Yes Historical Provider, MD  atorvastatin (LIPITOR) 20 MG tablet Take 1 tablet (20 mg total) by  mouth daily. 03/07/12 03/07/13 Yes Antonieta Iba, MD  clopidogrel (PLAVIX) 75 MG tablet Take 1 tablet (75 mg total) by mouth daily. 01/07/12 01/06/13 Yes Antonieta Iba, MD  Dextromethorphan-Guaifenesin (MUCINEX FAST-MAX DM MAX) 5-100 MG/5ML LIQD Take 20 mg by mouth every 4 (four) hours.   Yes Historical Provider, MD  esomeprazole (NEXIUM) 40 MG capsule Take 40 mg by mouth daily before breakfast.     Yes Historical Provider, MD  fluticasone (FLONASE) 50 MCG/ACT nasal spray Place 2 sprays into the nose daily.   Yes Historical Provider, MD  Fluticasone-Salmeterol (ADVAIR DISKUS) 500-50  MCG/DOSE AEPB Inhale 1 puff into the lungs every 12 (twelve) hours.     Yes Historical Provider, MD  Hypertonic Nasal Wash (SINUS RINSE NA) Place 1 Package into the nose as directed.   Yes Historical Provider, MD  levalbuterol Pauline Aus) 0.63 MG/3ML nebulizer solution Take 1 ampule by nebulization every 4 (four) hours as needed.     Yes Historical Provider, MD  Loratadine 10 MG CAPS Take 10 mg by mouth daily.   Yes Historical Provider, MD  LORazepam (ATIVAN) 0.5 MG tablet Take 0.5 mg by mouth at bedtime.   Yes Historical Provider, MD  nitroGLYCERIN (NITROSTAT) 0.4 MG SL tablet Place 1 tablet (0.4 mg total) under the tongue every 5 (five) minutes as needed. 05/08/12  Yes Iran Ouch, MD  sertraline (ZOLOFT) 50 MG tablet Take 2  tablets daily.   Yes Historical Provider, MD  sucralfate (CARAFATE) 1 G tablet Take 1 g by mouth 4 (four) times daily as needed.    Yes Historical Provider, MD  tiotropium (SPIRIVA) 18 MCG inhalation capsule Place 18 mcg into inhaler and inhale daily.     Yes Historical Provider, MD  chlorpheniramine-HYDROcodone (TUSSIONEX) 10-8 MG/5ML [facility]  08/30/12   Historical Provider, MD  fluticasone (VERAMYST) 27.5 MCG/SPRAY nasal spray Place 2 sprays into the nose daily.    Historical Provider, MD  levofloxacin (LEVAQUIN) 500 MG tablet Take 1 tablet (500 mg total) by mouth daily. 12/06/12   [name], MD  pramoxine-hydrocortisone (PROCTOCREAM-HC) 1-1 % rectal cream Place 1 application rectally as directed. hemorrhoids 08/15/12   Historical Provider, MD  predniSONE (DELTASONE) 20 MG tablet 3 tablets daily x 1 day, then 2 tablets daily x 5 days, then 1 tablet daily x 5 days 12/06/12   [name], MD  Pseudoephedrine-DM-GG [facility] COLD & COUGH PO) As directed per bottle when needed for cough    Historical Provider, MD    Allergies  Allergen Reactions  . Wellbutrin (Bupropion) Other (See Comments)    Unknown/"made me feel real funny"    History   Social History  .  Marital Status: Married    Spouse Name: N/A    Number of Children: 5  . 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 Smoking status: Current Every Day Smoker -- 1.5 packs/day for 42 years    Types: Cigarettes  . Smokeless tobacco: Never Used  . Alcohol Use: No  . Drug Use: No  . Sexually Active: Yes   Other Topics Concern  . Not on file   Social History Narrative   Marital status: married   Children:  1 stepchild and 15 grandchildren   Lives: with wife   Employment:  Renovations; home repairs   Tobacco: 1-2 ppd   Alcohol: none   Drugs: none   Exercise: no formal exercise; physically demanding  job; owns horses.    Family History  Problem Relation Age of Onset  . Heart attack Brother     Brother #1  . Diabetes Brother     brother #2  . Hypertension Brother     #3  . Coronary artery disease Father 26    deceased  . COPD Mother 61    deceased  . Heart attack Sister 14    acute-cocaine abuse/deceased  . Heart attack Brother 53    #4 deceased  . Diabetes Father     Objective:   Physical Exam  Nursing note and vitals reviewed. Constitutional: He is oriented to person, place, and time. He appears well-developed and well-nourished. No distress.  HENT:  Head: Normocephalic and atraumatic.  Right Ear: External ear normal.  Left Ear: External ear normal.  Nose: Nose normal.  Mouth/Throat: Mucous membranes are normal. No posterior oropharyngeal edema or posterior oropharyngeal erythema.       Drainage in OP.  Eyes: Conjunctivae normal and EOM are normal. Pupils are equal, round, and reactive to light.  Neck: Normal range of motion. Neck supple.  Cardiovascular: Normal rate, regular rhythm and normal heart sounds.   No murmur heard. Pulmonary/Chest: Effort normal. No accessory muscle usage. Not tachypneic. No respiratory distress. He has wheezes. He has no rhonchi. He has no rales.       Distant breath sounds  throughout; prolonged expiratory phase with expiratory wheezing throughout.  Speaking complete sentences.  Neurological: He is alert and oriented to person, place, and time.  Skin: Skin is warm and dry. No rash noted. He is not diaphoretic.  Psychiatric: He has a normal mood and affect. His behavior is normal.      [facility] reading (PRIMARY) by  Dr. Katrinka Blazing.  CXR:  NAD  ALBUTEROL NEBULIZER SOLUTION 2.5MG /3ML PROVIDED TO USE IN HOME NEBULIZER.   Assessment & Plan:   1. Cough  POCT Influenza A/B  2. Bronchospasm  DG Chest 2 View  3. Chronic airway obstruction, not elsewhere classified      1.  Acute bronchitis:  New. Recent rx for Doxycycline at end of 10/2012; thus, will treat with Levaquin.  Continue supportive care with Mucinex, Loratadine, Flonase, nasal saline.   2.  COPD exacerbation:  New.  Declined nebulizer treatment in office. Rx for Prednisone provided; to use nebulizer tid to qid for next week and then PRN.  RTC for acute worsening or follow-up with pulmonology.    Meds ordered this encounter  Medications  . fluticasone (FLONASE) 50 MCG/ACT nasal spray    Sig: Place 2 sprays into the nose daily.  [name] Dextromethorphan-Guaifenesin (MUCINEX FAST-MAX DM MAX) 5-100 MG/5ML LIQD    Sig: Take 20 mg by mouth every 4 (four) hours.  . Hypertonic Nasal Wash (SINUS RINSE NA)    Sig: Place 1 Package into the nose as directed.  [name] levofloxacin (LEVAQUIN) 500 MG tablet    Sig: Take 1 tablet (500 mg total) by mouth daily.    Dispense:  10 tablet    Refill:  0  . predniSONE (DELTASONE) 20 MG tablet    Sig: 3 tablets daily x 1 day, then 2 tablets daily x 5 days, then 1 tablet daily x 5 days    Dispense:  18 tablet    Refill:  0

## 2012-12-06 NOTE — Patient Instructions (Addendum)
1. Cough  POCT Influenza A/B  2. Bronchospasm  DG Chest 2 View  3. Chronic airway obstruction, not elsewhere classified

## 2012-12-07 ENCOUNTER — Telehealth: Payer: Self-pay

## 2012-12-07 NOTE — Telephone Encounter (Signed)
Pt is waiting a call regarding his labs and chest x-ray results.  Call [phone]

## 2012-12-07 NOTE — Telephone Encounter (Signed)
Spoke with pt advised xray report not in yet.

## 2012-12-08 ENCOUNTER — Encounter: Payer: Self-pay | Admitting: Family Medicine

## 2012-12-20 HISTORY — PX: OTHER SURGICAL HISTORY: SHX169

## 2013-01-02 ENCOUNTER — Ambulatory Visit (INDEPENDENT_AMBULATORY_CARE_PROVIDER_SITE_OTHER): Payer: BC Managed Care – PPO | Admitting: Pulmonary Disease

## 2013-01-02 ENCOUNTER — Encounter: Payer: Self-pay | Admitting: Pulmonary Disease

## 2013-01-02 VITALS — BP 132/70 | HR 74 | Temp 98.3°F | Ht 70.0 in | Wt 163.0 lb

## 2013-01-02 DIAGNOSIS — R059 Cough, unspecified: Secondary | ICD-10-CM

## 2013-01-02 DIAGNOSIS — F17200 Nicotine dependence, unspecified, uncomplicated: Secondary | ICD-10-CM

## 2013-01-02 DIAGNOSIS — R05 Cough: Secondary | ICD-10-CM

## 2013-01-02 DIAGNOSIS — J329 Chronic sinusitis, unspecified: Secondary | ICD-10-CM

## 2013-01-02 DIAGNOSIS — J441 Chronic obstructive pulmonary disease with (acute) exacerbation: Secondary | ICD-10-CM

## 2013-01-02 MED ORDER — LEVOFLOXACIN 250 MG PO TABS: 250.0000 mg | ORAL_TABLET | Freq: Every day | ORAL | Status: DC

## 2013-01-02 MED ORDER — PREDNISONE 1 MG PO TABS: 1.0000 mg | ORAL_TABLET | Freq: Every day | ORAL | Status: DC

## 2013-01-02 MED ORDER — PREDNISONE 10 MG PO TABS: ORAL_TABLET | ORAL | Status: DC

## 2013-01-02 MED ORDER — LEVOFLOXACIN 250 MG PO TABS: 500.0000 mg | ORAL_TABLET | Freq: Every day | ORAL | Status: AC

## 2013-01-02 NOTE — Assessment & Plan Note (Signed)
Leslie Sims has experienced sinus congestion mostly localized to the right side for the first time in his life for the last several months. He also has some tonsillar swelling on the right side noted on physical exam today. Because of his ongoing smoking history and these focal findings on exam I want him to be evaluated by ear nose and throat physician. We will refer him to the local otolaryngology group which has provided excellent care for our patients.

## 2013-01-02 NOTE — Assessment & Plan Note (Addendum)
Leslie Sims is having another flare of COPD. These have been recurrent lately and definitely due to his ongoing smoking.  However I would also consider hypersensitivity pneumonitis versus allergic bronchogenic pulmonary aspergillosis given the fact that he reports significant mold in his house.  He does not have evidence of pneumonia based on the fact that he is afebrile and his lungs do not have focal findings. His most recent chest x-ray was performed about 3 weeks ago and was clear so I don't think we need one today.  The main intervention we need to make at this point is to get him to quit smoking.  Plan: -Educated at length to quit smoking, see below -Continue Spiriva, Advair, short acting bronchodilators -Check serum IgE, IgE to Aspergillus, and hypersensitivity pneumonitis panels -Consider CT chest if no improvement in 2-3 weeks -Prednisone taper -Levaquin

## 2013-01-02 NOTE — Patient Instructions (Addendum)
Take the prednisone as written Take the levaquin as written Call us if you are not better, but we will plan on seeing you in 2-3 weeks If your shortness of breath worsens or if you develop chest pain, fevers, or chills you should be seen by a doctor sooner  We will refer you to ENT for evaluation of your ongoing right sided sinus congestion and tonsillar swelling.  We will see you back in 2-3 weeks or sooner if needed

## 2013-01-02 NOTE — Progress Notes (Signed)
Subjective:    Patient ID: Leslie Sims, male    DOB: [birth year], 62 y.o.   MRN: [id number]  [name] first saw the [facility] in August 2013 for COPD.  He has smoked 1.5 PPD for greater than 40 years and continued to smoke when we saw him.  His simple spirometry showed an FEV 1 1.15L (31% pred) and a CXR in 05/2012 showed emphysema.   HPI   11/14/2012 ROV -- Leslie Sims returns to clinic today stating that he was recently seen in urgent care facility for sinus congestion and shortness of breath. He was started on prednisone and loratadine as well as fluticasone and he says that this has helped some. He finished taking the prednisone today any shortness of breath and wheezing are a little better. He is still coughing green to brown sputum up on a regular basis. He denies fevers chills, chest pain, or weight loss. He is been a little stressed out lately because his wife had surgery and he's had to be taking care of her at home. He wants to quit smoking but as of now he is still smoking about a pack a day.  01/02/13 sick visit-- Terri has experienced increased sinus congestion, green sputum production, chest tightness, and increasing shortness of breath over the last 4 days. He has not experienced fever or chest pain. He has been using all of his inhalers and his saline rinses and nasal steroid as written. He continues to smoke cigarettes. He was seen in December 2013 for a flare of COPD. He is unaware of any sick contacts in the last few days. He is considering starting use of the e e-cigarette.   Past Medical History  Diagnosis Date  . Hyperlipidemia   . COPD (chronic obstructive pulmonary disease)   . Depression   . GERD (gastroesophageal reflux disease)   . Colon polyps 2008    colonoscopy  . Status post dilation of esophageal narrowing 2000  . Peptic ulcer   . Coronary artery disease 11/2009    Anterior MI with late presentation 11/2009. Cath showed a 100%  proximal LAD occlusion, 99% mid RCA. Had a PCI and 2 DES to both LAD and RCA. EF was 35%. Nuclear stress test 05/13: prior anterior/inferior infarcts without ischemic, EF 43%  . Hypertension   . CARDIOMYOPATHY 03/24/2010    did not tolerate Toprol and Lisinopril.   . Insomnia   . Hemorrhoids   . MI (myocardial infarction)   . Atherosclerosis   . Anxiety   . Headache   . Tinea pedis   . Myalgia   . Helicobacter pylori (H. pylori)   . Thyroid dysfunction   . Chest pain   . Back pain   . Hernia     inguinal  . Allergic rhinitis   . Bronchitis     09-14-11  . Chronic airway obstruction, not elsewhere classified      Review of Systems  Constitutional: Positive for fatigue. Negative for fever and chills.  HENT: Positive for congestion, rhinorrhea and postnasal drip.   Respiratory: Positive for cough, shortness of breath and wheezing.   Cardiovascular: Negative for chest pain, palpitations and leg swelling.       Objective:   Physical Exam   Filed Vitals:   01/02/13 1618  BP: 132/70  Pulse: 74  Temp: 98.3 F (36.8 C)  TempSrc: Oral  Height: 5\' 10"  (1.778 m)  Weight: 163 lb (73.936 kg)  SpO2: 93%    Gen:  Mildly ill appearing, no acute distress HEENT: NCAT, PERRL, EOMi, OP with right-sided tonsillar swelling (no exudate), left submandibular tender lymphadenopathy PULM: exp wheezing bilaterally, no crackles CV: RRR, no mgr, no JVD AB: BS+, soft, nontender, no hsm Ext: warm, no edema, no clubbing, no cyanosis   June 2013 chest x-ray personally reviewed by me: Emphysema bilaterally  August 2013 simple spirometry: Ratio 45% FEV1 1.15 L (31% predicted) December 20th 2013 chest x-ray-bilateral emphysema no focal infiltrate     Assessment & Plan:   COPD exacerbation Leslie Sims is having another flare of COPD. These have been recurrent lately and definitely due to his ongoing smoking.  However I would also consider hypersensitivity pneumonitis versus allergic bronchogenic  pulmonary aspergillosis given the fact that he reports significant mold in his house.  He does not have evidence of pneumonia based on the fact that he is afebrile and his lungs do not have focal findings. His most recent chest x-ray was performed about 3 weeks ago and was clear so I don't think we need one today.  The main intervention we need to make at this point is to get him to quit smoking.  Plan: -Educated at length to quit smoking, see below -Continue Spiriva, Advair, short acting bronchodilators -Check serum IgE, IgE to Aspergillus, and hypersensitivity pneumonitis panels -Consider CT chest if no improvement in 2-3 weeks -Prednisone taper -Levaquin  SMOKER We discussed this at length today. His ongoing smoking is clearly contributing to his recurrent COPD exacerbations. He has nicotine patches at home and he is contemplating using the e-cigarette. I explained to him that I don't know its in the e-cigarette because it is not regulated by the FDA however anything that'll get him to quit smoking is beneficial at this point. He does not want Chantix because it made him feel jittery before.  Sinusitis Leslie Sims has experienced sinus congestion mostly localized to the right side for the first time in his life for the last several months. He also has some tonsillar swelling on the right side noted on physical exam today. Because of his ongoing smoking history and these focal findings on exam I want him to be evaluated by ear nose and throat physician. We will refer him to the local otolaryngology group which has provided excellent care for our patients.    Updated Medication List Outpatient Encounter Prescriptions as of 01/02/2013  Medication Sig Dispense Refill  . albuterol (VENTOLIN HFA) 108 (90 BASE) MCG/ACT inhaler Inhale 2 puffs into the lungs every 6 (six) hours as needed. For wheezing      . aspirin 81 MG tablet Take 81 mg by mouth daily.        [name] atorvastatin (LIPITOR) 20 MG tablet  Take 1 tablet (20 mg total) by mouth daily.  30 tablet  1  . clopidogrel (PLAVIX) 75 MG tablet Take 1 tablet (75 mg total) by mouth daily.  90 tablet  3  . Dextromethorphan-Guaifenesin (MUCINEX FAST-MAX DM MAX) 5-100 MG/5ML LIQD Take 20 mg by mouth every 4 (four) hours.      [name] esomeprazole (NEXIUM) 40 MG capsule Take 40 mg by mouth daily before breakfast.        . fluticasone (FLONASE) 50 MCG/ACT nasal spray Place 2 sprays into the nose daily.      . Fluticasone-Salmeterol (ADVAIR DISKUS) 500-50 MCG/DOSE AEPB Inhale 1 puff into the lungs every 12 (twelve) hours.        . Hypertonic Nasal Wash (SINUS RINSE NA) Place 1 Package into  the nose as directed.      . levalbuterol (XOPENEX) 0.63 MG/3ML nebulizer solution Take 1 ampule by nebulization every 4 (four) hours as needed.        [name] LORazepam (ATIVAN) 0.5 MG tablet Take 0.5 mg by mouth at bedtime.      . nitroGLYCERIN (NITROSTAT) 0.4 MG SL tablet Place 1 tablet (0.4 mg total) under the tongue every 5 (five) minutes as needed.  25 tablet  3  . pramoxine-hydrocortisone (PROCTOCREAM-HC) 1-1 % rectal cream Place 1 application rectally as directed. hemorrhoids      . Pseudoephedrine-DM-GG (ROBITUSSIN COLD & COUGH PO) As directed per bottle when needed for cough      . sertraline (ZOLOFT) 50 MG tablet Take 2  tablets daily.      . sucralfate (CARAFATE) 1 G tablet Take 1 g by mouth 4 (four) times daily as needed.       . tiotropium (SPIRIVA) 18 MCG inhalation capsule Place 18 mcg into inhaler and inhale daily.        . [DISCONTINUED] chlorpheniramine-HYDROcodone (TUSSIONEX) 10-8 MG/5ML LQCR       . [DISCONTINUED] fluticasone (VERAMYST) 27.5 MCG/SPRAY nasal spray Place 2 sprays into the nose daily.      . [DISCONTINUED] levofloxacin (LEVAQUIN) 500 MG tablet Take 1 tablet (500 mg total) by mouth daily.  10 tablet  0  . [DISCONTINUED] Loratadine 10 MG CAPS Take 10 mg by mouth daily.      . [DISCONTINUED] predniSONE (DELTASONE) 20 MG tablet 3 tablets daily x 1  day, then 2 tablets daily x 5 days, then 1 tablet daily x 5 days  18 tablet  0

## 2013-01-02 NOTE — Assessment & Plan Note (Signed)
We discussed this at length today. His ongoing smoking is clearly contributing to his recurrent COPD exacerbations. He has nicotine patches at home and he is contemplating using the e-cigarette. I explained to him that I don't know its in the e-cigarette because it is not regulated by the FDA however anything that'll get him to quit smoking is beneficial at this point. He does not want Chantix because it made him feel jittery before.

## 2013-01-03 ENCOUNTER — Other Ambulatory Visit: Payer: Self-pay | Admitting: Pulmonary Disease

## 2013-01-03 ENCOUNTER — Other Ambulatory Visit: Payer: Self-pay | Admitting: *Deleted

## 2013-01-03 DIAGNOSIS — J329 Chronic sinusitis, unspecified: Secondary | ICD-10-CM

## 2013-01-03 DIAGNOSIS — J351 Hypertrophy of tonsils: Secondary | ICD-10-CM

## 2013-01-03 MED ORDER — ATORVASTATIN CALCIUM 20 MG PO TABS: 20.0000 mg | ORAL_TABLET | Freq: Every day | ORAL | Status: DC

## 2013-01-03 MED ORDER — ESOMEPRAZOLE MAGNESIUM 40 MG PO CPDR: 40.0000 mg | DELAYED_RELEASE_CAPSULE | Freq: Every day | ORAL | Status: DC

## 2013-01-03 NOTE — Telephone Encounter (Signed)
Pt needs a 7 day supply due to mail order loosing his meds

## 2013-01-03 NOTE — Telephone Encounter (Signed)
Refilled Atorvastatin and Nexium pt requesting 7 day refill until mail order is received.

## 2013-01-04 LAB — ALLERGEN A NIGER M207: Aspergillus niger, m207: <0.1 kU/L

## 2013-01-05 ENCOUNTER — Ambulatory Visit: Payer: Self-pay | Admitting: Unknown Physician Specialty

## 2013-01-05 IMAGING — CR PARANASAL SINUSES - COMPLETE 3 + VIEW
1 series · 4 of 4 positions shown · non-contrast
Comparison: none

REASON FOR EXAM: deviated nasal septum hypertrophy of nasal turbinates
COMMENTS:

PROCEDURE:     KDR - KDXR SINUSES PARANASAL COMPLETE  - [DATE] [DATE]
RESULT:     Air-fluid level in the right maxillary sinus. This is consistent
sinusitis. Orbits normal. Mastoids are clear. Sella turcica normal.

[Series 1: pa · 0.17mm/px · 4 of 4 slices shown]
[im 1/4]
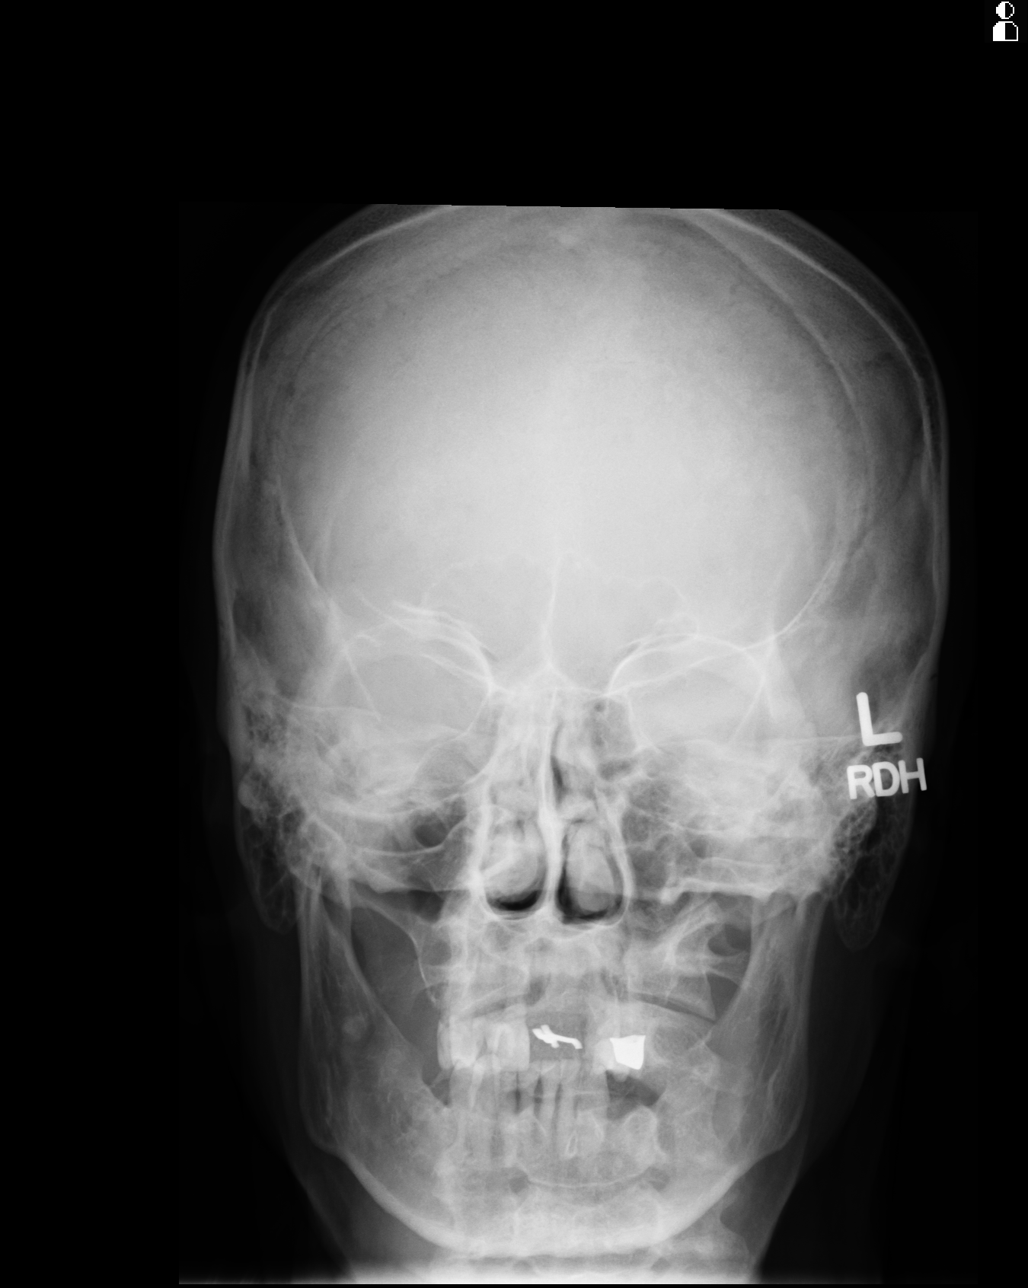
[im 2/4]
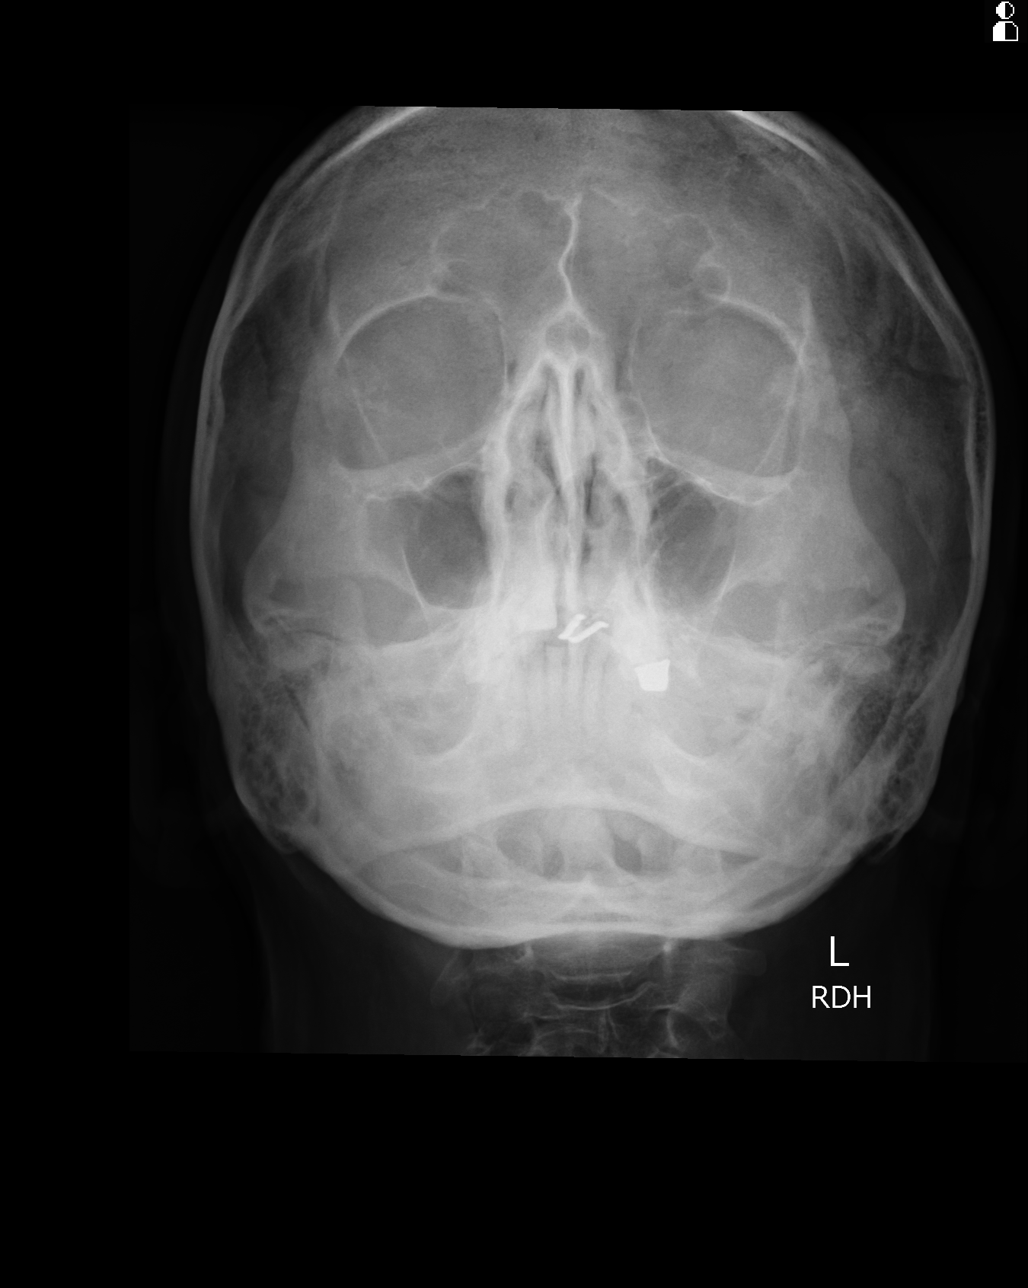
[im 3/4]
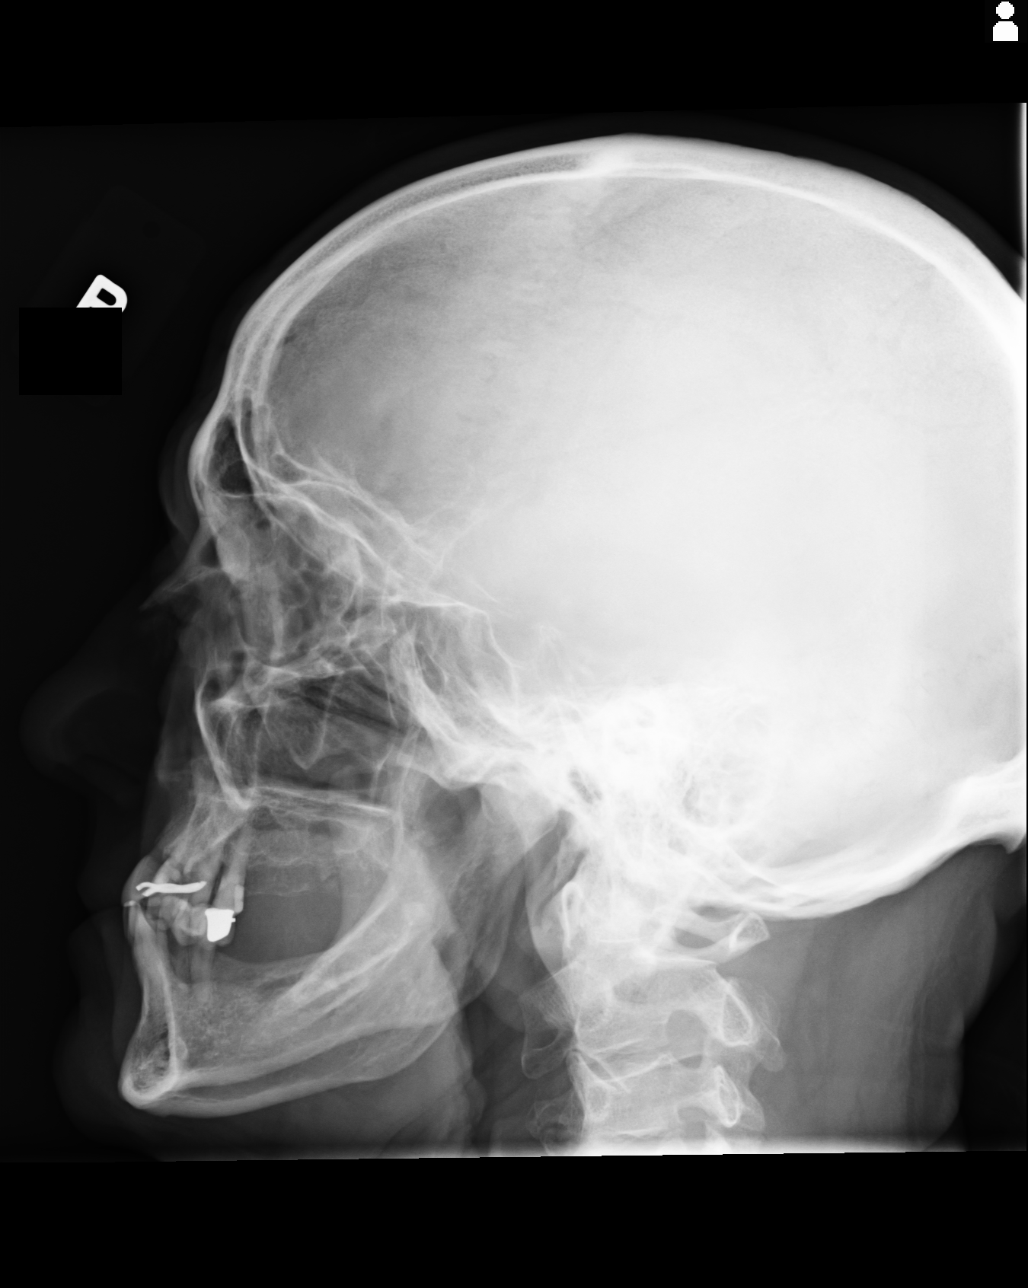
[im 4/4]
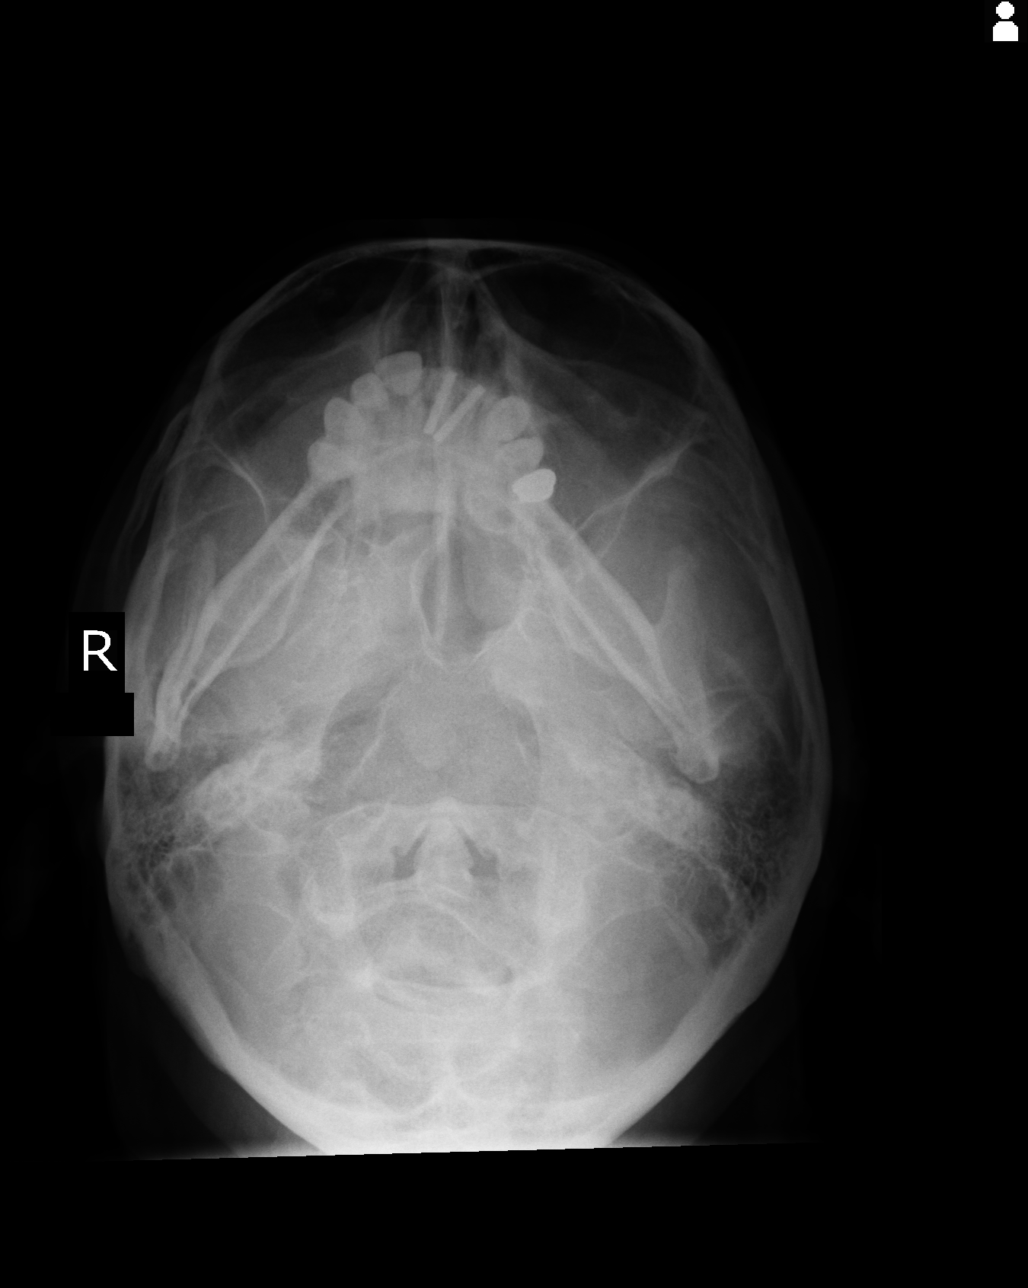

[4 of 4 positions shown; findings below may reference images not displayed]

IMPRESSION: Air-fluid level right maxillary sinus consistent with
sinusitis.

## 2013-01-06 LAB — ALLERGEN A FUMIGATUS IGG

## 2013-01-10 ENCOUNTER — Inpatient Hospital Stay: Payer: Self-pay | Admitting: Internal Medicine

## 2013-01-10 ENCOUNTER — Telehealth: Payer: Self-pay

## 2013-01-10 LAB — COMPREHENSIVE METABOLIC PANEL
Albumin: 3.6 g/dL (ref 3.4–5.0)
Anion Gap: 7 (ref 7–16)
BUN: 8 mg/dL (ref 7–18)
Calcium, Total: 8.5 mg/dL (ref 8.5–10.1)
Chloride: 106 mmol/L (ref 98–107)
Creatinine: 0.71 mg/dL (ref 0.60–1.30)
EGFR (African American): >60
EGFR (Non-African Amer.): >60
Osmolality: 278 (ref 275–301)
SGOT(AST): 19 U/L (ref 15–37)
SGPT (ALT): 22 U/L (ref 12–78)
Sodium: 140 mmol/L (ref 136–145)

## 2013-01-10 LAB — CBC
HCT: 41.7 % (ref 40.0–52.0)
HGB: 14.3 g/dL (ref 13.0–18.0)
MCHC: 34.2 g/dL (ref 32.0–36.0)
MCV: 87 fL (ref 80–100)
Platelet: 258 10*3/uL (ref 150–440)
RBC: 4.82 10*6/uL (ref 4.40–5.90)
RDW: 14.8 % — ABNORMAL HIGH (ref 11.5–14.5)
WBC: 7.5 10*3/uL (ref 3.8–10.6)

## 2013-01-10 LAB — CK TOTAL AND CKMB (NOT AT ARMC)
CK, Total: 41 U/L (ref 35–232)
CK-MB: 1.8 ng/mL (ref 0.5–3.6)

## 2013-01-10 LAB — URINALYSIS, COMPLETE
Blood: NEGATIVE
Leukocyte Esterase: NEGATIVE
Nitrite: NEGATIVE
Ph: 7 (ref 4.5–8.0)
Protein: NEGATIVE
RBC,UR: NONE SEEN /HPF (ref 0–5)
Specific Gravity: 1.004 (ref 1.003–1.030)
Squamous Epithelial: NONE SEEN

## 2013-01-10 LAB — TROPONIN I: Troponin-I: <0.02 ng/mL

## 2013-01-10 IMAGING — CR DG CHEST 1V PORT
1 series · 1 of 1 positions shown · non-contrast
Comparison: none

REASON FOR EXAM: DYSPNEA
COMMENTS:

PROCEDURE:     DXR - DXR PORTABLE CHEST SINGLE VIEW  - [DATE]  [DATE]
RESULT:     Comparison: [DATE], [DATE]

[ap]
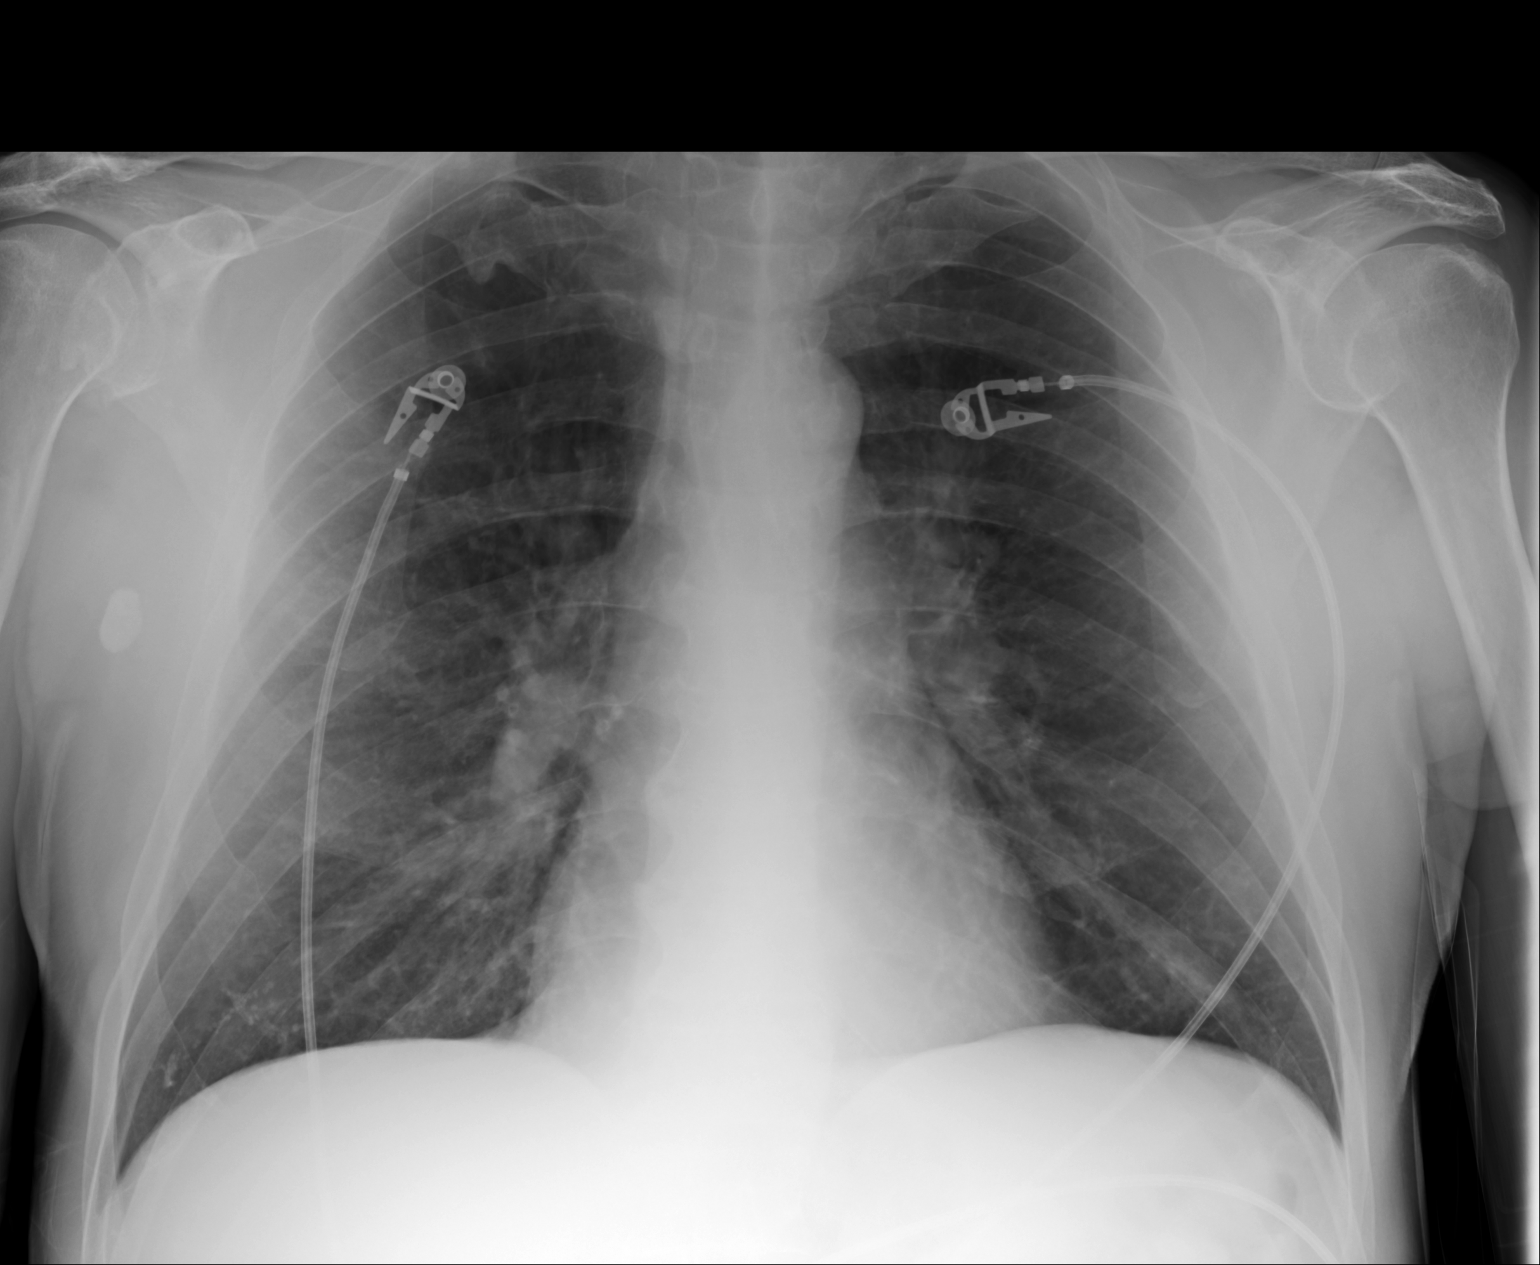

[1 of 1 positions shown; findings below may reference images not displayed]

FINDINGS: The heart and mediastinum are stable. There is borderline hyperinflation.
Mild right infrahilar opacities may be secondary to atelectasis and/or
technique.
IMPRESSION: Mild right infrahilar opacities may be secondary to atelectasis in
technique. However, PA and lateral chest radiograph is recommended to
exclude other etiology.

[REDACTED]

## 2013-01-10 NOTE — Telephone Encounter (Signed)
Dr. Katrinka Blazing   Mr. Kiss is in [facility] with upper infection on his lungs.  Mrs. Wisdom Rickey wanted you to know this information.   She can be reached at [phone]

## 2013-01-11 LAB — CBC WITH DIFFERENTIAL/PLATELET
Basophil #: 0 10*3/uL (ref 0.0–0.1)
Eosinophil #: 0 10*3/uL (ref 0.0–0.7)
Eosinophil %: 0 %
HCT: 43.4 % (ref 40.0–52.0)
HGB: 15 g/dL (ref 13.0–18.0)
Lymphocyte %: 5.1 %
MCHC: 34.6 g/dL (ref 32.0–36.0)
Neutrophil #: 8.5 10*3/uL — ABNORMAL HIGH (ref 1.4–6.5)
Platelet: 254 10*3/uL (ref 150–440)
RDW: 14.7 % — ABNORMAL HIGH (ref 11.5–14.5)

## 2013-01-11 LAB — BASIC METABOLIC PANEL
Anion Gap: 4 — ABNORMAL LOW (ref 7–16)
Chloride: 105 mmol/L (ref 98–107)
EGFR (African American): >60
Sodium: 140 mmol/L (ref 136–145)

## 2013-01-11 IMAGING — CR DG CHEST 2V
1 series · 4 of 4 positions shown · non-contrast
Comparison: none

REASON FOR EXAM: HYPOXIA- PNEUMONIA.
COMMENTS:

[Series 1: x chest ap · 0.14mm/px · 4 of 4 slices shown]
[im 1/4]
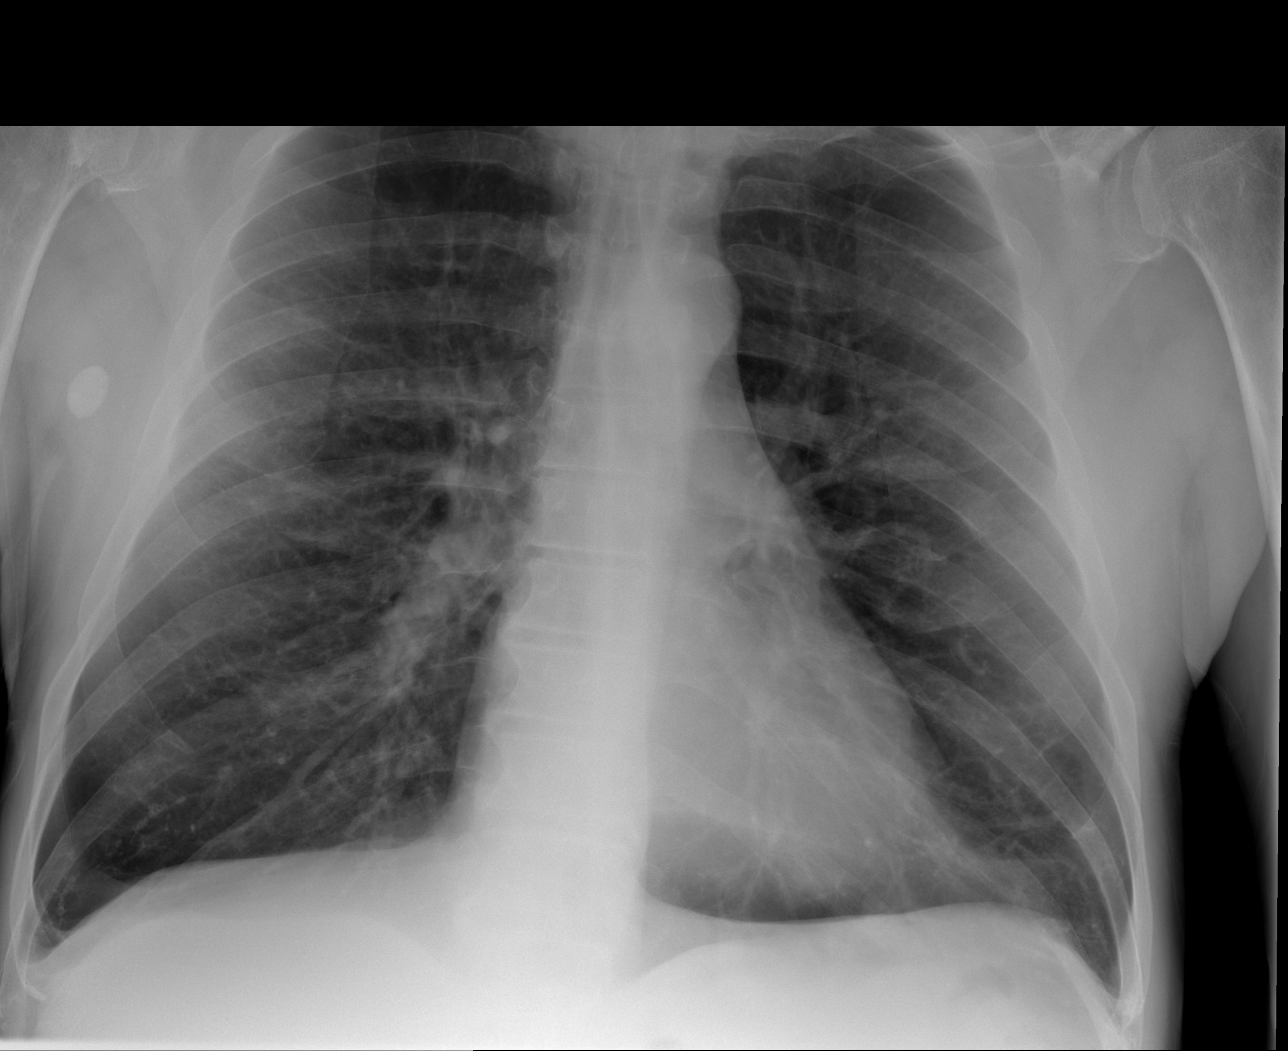
[im 2/4]
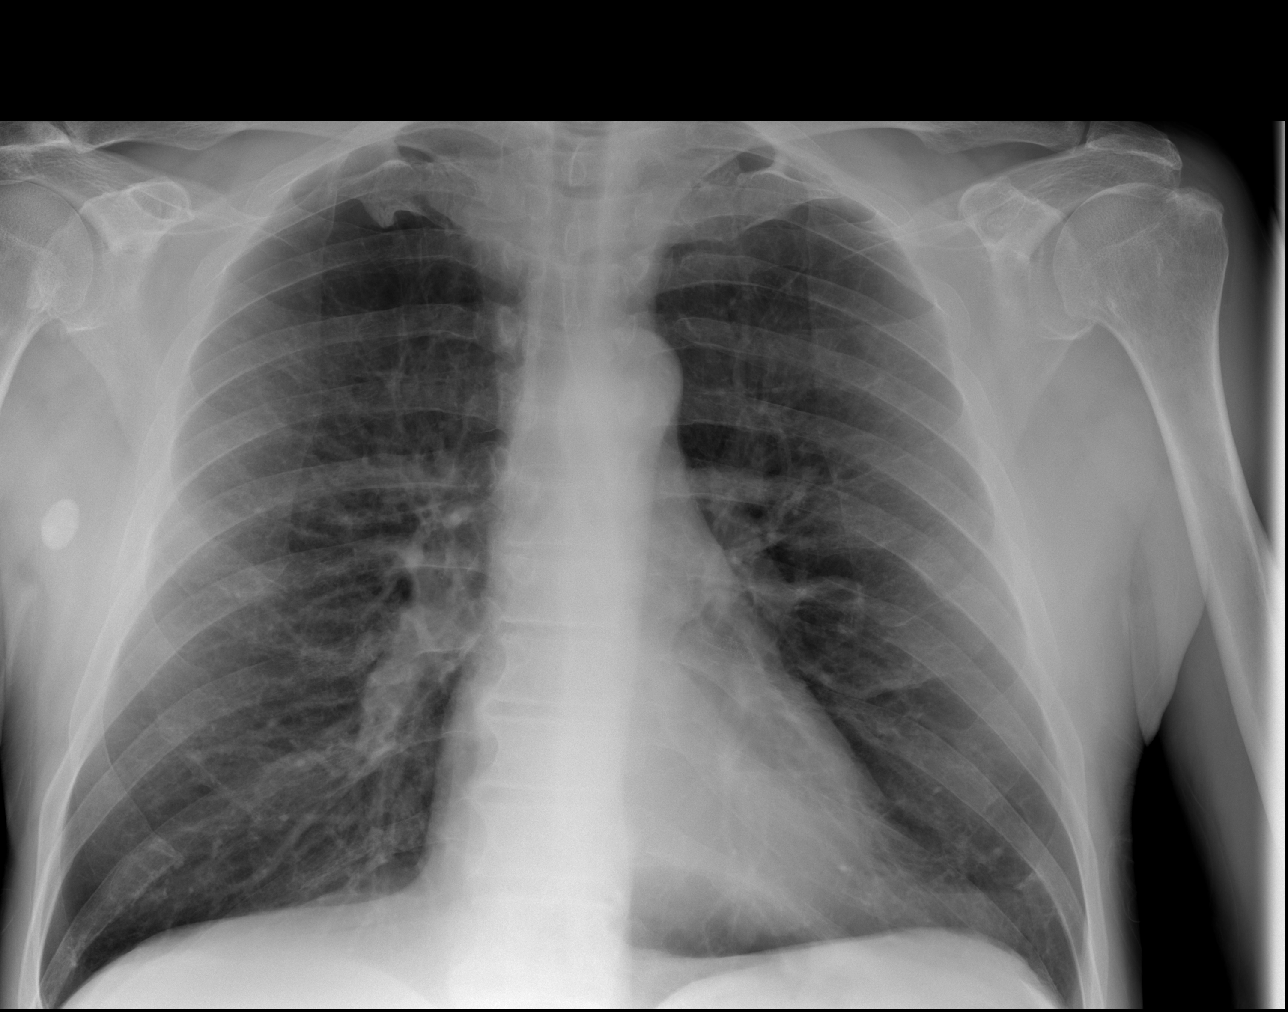
[im 3/4]
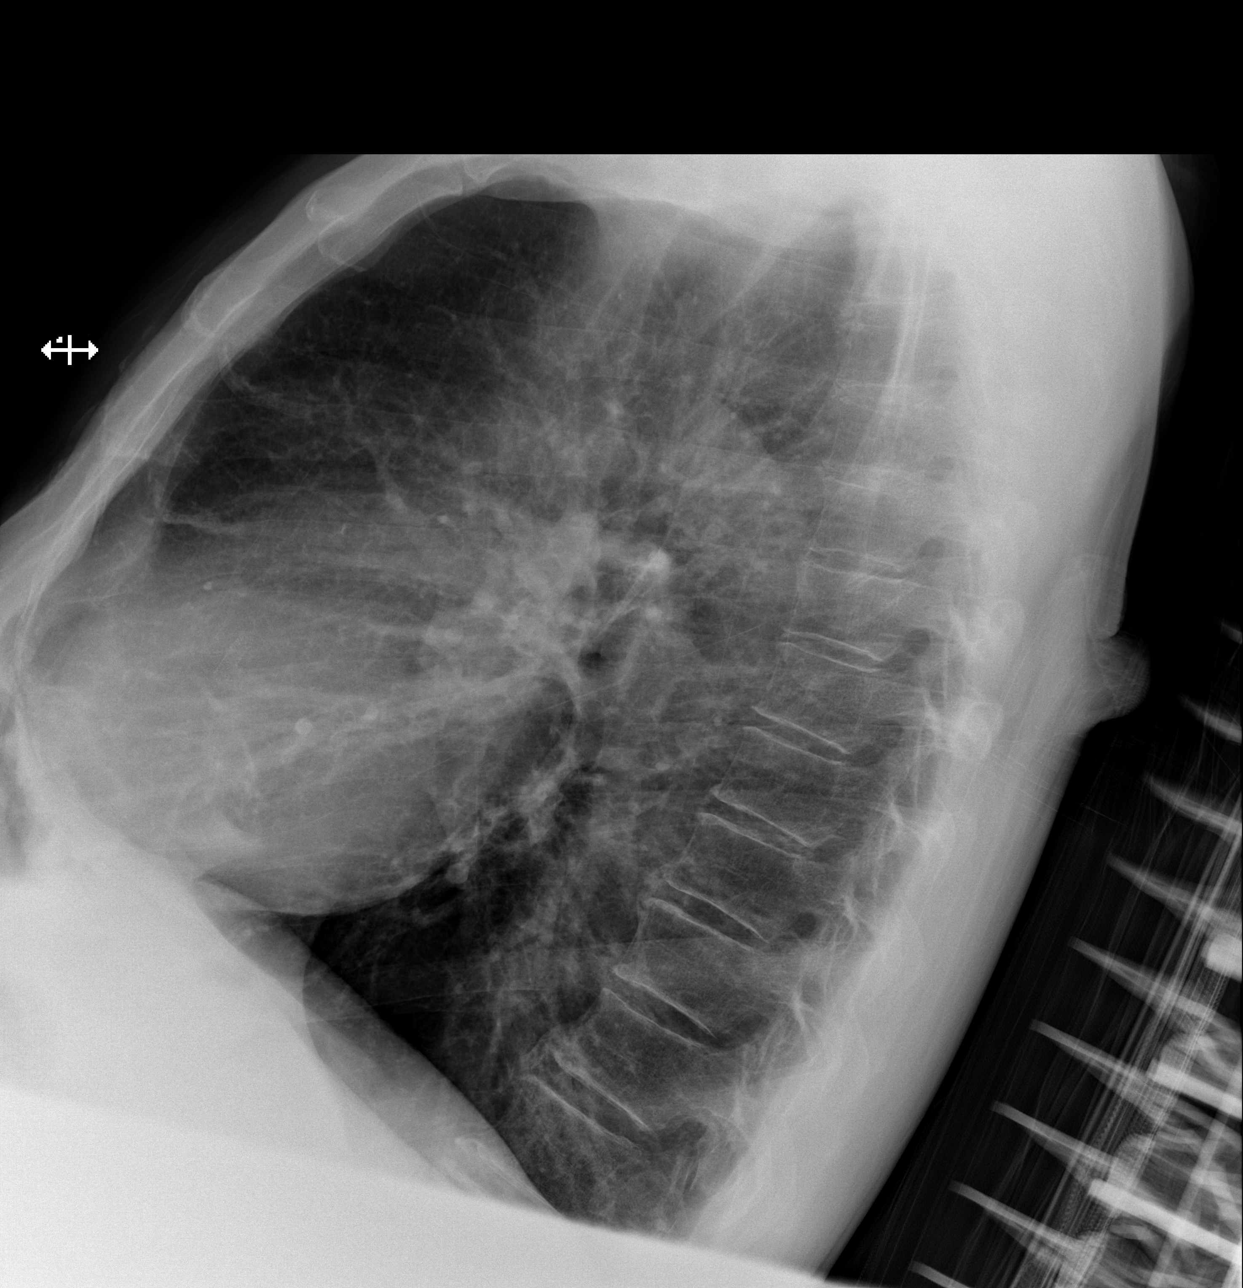
[im 4/4]
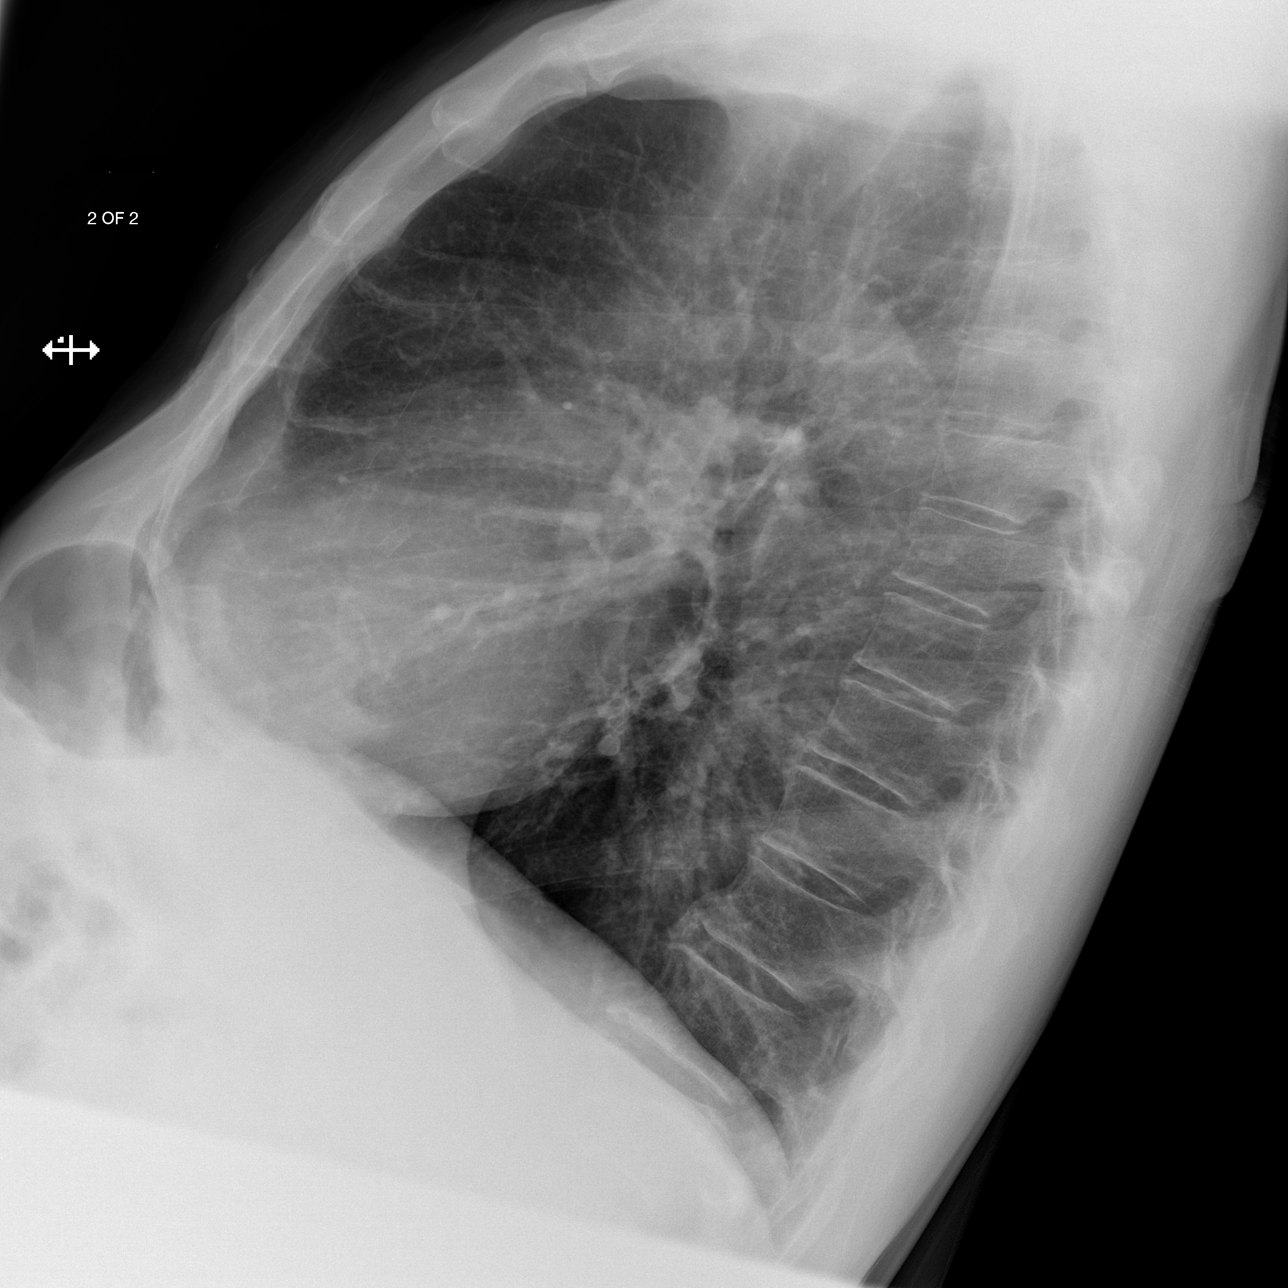

[4 of 4 positions shown; findings below may reference images not displayed]

PROCEDURE:     DXR - DXR CHEST PA (OR AP) AND LATERAL  - [DATE]  [DATE]

RESULT:     Comparison is made to the previous exam dated [DATE].

The lungs are hyperinflated consistent with COPD. The lungs are clear. The
heart and pulmonary vessels are normal. The bony and mediastinal structures
are unremarkable. There is no effusion. There is no pneumothorax or evidence
of congestive failure.
IMPRESSION: No acute cardiopulmonary disease.

[REDACTED]

## 2013-01-15 NOTE — Telephone Encounter (Signed)
Spoke with patient; awoke short of breath, unable to breath.  Admitted at [facility] Wednesday to Sunday.  Discharged yesterday.  Still very weak.  Diagnosed with bronchitis, COPD exacerbation.  To follow-up with pulmonology next week.  Scheduled to see me in two days.

## 2013-01-16 ENCOUNTER — Telehealth: Payer: Self-pay | Admitting: *Deleted

## 2013-01-16 NOTE — Telephone Encounter (Signed)
LMTCB

## 2013-01-16 NOTE — Telephone Encounter (Signed)
Message copied by Caryl Ada on Tue Jan 16, 2013  8:40 AM ------      Message from: Max Fickle B      Created: Tue Jan 16, 2013  8:32 AM       L,            Please tell him that his blood work showed that he is allergic to a mold in his house.  I think that he may need special treatment for it, but he needs to see an allergist to confirm it first.  I'm going to refer him to Dr. Maple Hudson.              Thanks,      Kipp Brood

## 2013-01-17 ENCOUNTER — Encounter: Payer: BC Managed Care – PPO | Admitting: Family Medicine

## 2013-01-17 NOTE — Telephone Encounter (Signed)
LMTCB

## 2013-01-18 NOTE — Telephone Encounter (Signed)
LMOMTCB X3

## 2013-01-22 ENCOUNTER — Telehealth: Payer: Self-pay | Admitting: Pulmonary Disease

## 2013-01-22 ENCOUNTER — Ambulatory Visit: Payer: BC Managed Care – PPO | Admitting: Cardiovascular Disease

## 2013-01-22 NOTE — Telephone Encounter (Signed)
Returning call can be reached at [phone].Leslie Sims

## 2013-01-22 NOTE — Telephone Encounter (Signed)
LMTCB again on [phone] per pt request. Carron Curie, CMA

## 2013-01-22 NOTE — Telephone Encounter (Signed)
LMTCB x1 for pt.  

## 2013-01-22 NOTE — Telephone Encounter (Signed)
Duplicate message. See message from 01-16-13. Carron Curie, CMA

## 2013-01-23 NOTE — Telephone Encounter (Signed)
Pt returned call.  Advised of lab results / recs as stated by Dr Kendrick Fries below.  Pt okay with these recs and verbalized his understanding.  Consult to be scheduled by [facility] up front; pt transferred.  Pt scheduled to see CY 3.7.14 @ 2:30pm. Will sign and forward to BQ as FYI.

## 2013-01-30 ENCOUNTER — Other Ambulatory Visit: Payer: Self-pay | Admitting: *Deleted

## 2013-01-30 ENCOUNTER — Encounter: Payer: Self-pay | Admitting: Cardiovascular Disease

## 2013-01-30 ENCOUNTER — Ambulatory Visit (INDEPENDENT_AMBULATORY_CARE_PROVIDER_SITE_OTHER): Payer: BC Managed Care – PPO | Admitting: Cardiovascular Disease

## 2013-01-30 VITALS — BP 140/80 | HR 71 | Ht 70.0 in | Wt 162.0 lb

## 2013-01-30 DIAGNOSIS — I428 Other cardiomyopathies: Secondary | ICD-10-CM

## 2013-01-30 DIAGNOSIS — I2510 Atherosclerotic heart disease of native coronary artery without angina pectoris: Secondary | ICD-10-CM

## 2013-01-30 DIAGNOSIS — E785 Hyperlipidemia, unspecified: Secondary | ICD-10-CM

## 2013-01-30 MED ORDER — CLOPIDOGREL BISULFATE 75 MG PO TABS: 75.0000 mg | ORAL_TABLET | Freq: Every day | ORAL | Status: DC

## 2013-01-30 NOTE — Assessment & Plan Note (Signed)
Continue treatment with atorvastatin. 

## 2013-01-30 NOTE — Assessment & Plan Note (Signed)
His dyspnea improved after cutting down on tobacco use. However, he continues to have frequent episodes of substernal chest discomfort which is worrisome for angina. He does have known history of coronary artery disease. Stress test last year was abnormal by ECG criteria with significant ST depression. He did have a hypertensive response. I recommend proceeding with cardiac catheterization and possible coronary intervention. Risks, benefits and alternatives were discussed with him.

## 2013-01-30 NOTE — Progress Notes (Signed)
Primary care physician: Dr. Nilda Simmer  HPI  This is a 62 year old male who is here today for a followup visit. He has known history of coronary artery disease status post anterior myocardial infarction in December of 2012. His LAD was occluded and RCA had a 99% stenosis. He had an angioplasty and drug-eluting stent placement to both LAD and RCA at that time.  In May of last year He underwent a treadmill nuclear stress test which showed evidence of prior anterior and inferior infarcts without significant ischemia. Ejection fraction was 43%. He was hypertensive at baseline and had a hypertensive response to exercise with systolic blood pressure greater than 200 with 2 mm ST depression with exercise. He was started on small dose Toprol and lisinopril but did not tolerate both medications due to dizziness, fatigue and lower extremity discomfort. . he had lower extremity discomfort but  ABI  was normal.  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 He cut down to 10 cigarettes a day. He feels significant improvement in his dyspnea. However, he continues to have frequent episodes of substernal chest discomfort lasting for 5-10 minutes both at rest and with activities.  Allergies  Allergen Reactions  . Wellbutrin (Bupropion) Other (See Comments)    Unknown/"made me feel real funny"     Current Outpatient Prescriptions on File Prior to Visit  Medication Sig Dispense Refill  . albuterol (VENTOLIN HFA) 108 (90 BASE) MCG/ACT inhaler Inhale 2 puffs into the lungs every 6 (six) hours as needed. For wheezing      . aspirin 81 MG tablet Take 81 mg by mouth daily.        [name] atorvastatin (LIPITOR) 20 MG tablet Take 1 tablet (20 mg total) by mouth daily.  7 tablet  0  . Dextromethorphan-Guaifenesin (MUCINEX FAST-MAX DM MAX) 5-100 MG/5ML LIQD Take 20 mg by mouth as needed.       [name] esomeprazole (NEXIUM) 40 MG capsule Take 1 capsule (40 mg total) by mouth daily before breakfast.  7 capsule  0  .  fluticasone (FLONASE) 50 MCG/ACT nasal spray Place 2 sprays into the nose daily.      . Fluticasone-Salmeterol (ADVAIR DISKUS) 500-50 MCG/DOSE AEPB Inhale 1 puff into the lungs every 12 (twelve) hours.        . Hypertonic Nasal Wash (SINUS RINSE NA) Place 1 Package into the nose as needed.       [name] LORazepam (ATIVAN) 0.5 MG tablet Take 0.5 mg by mouth at bedtime.      . nitroGLYCERIN (NITROSTAT) 0.4 MG SL tablet Place 1 tablet (0.4 mg total) under the tongue every 5 (five) minutes as needed.  25 tablet  3  . Pseudoephedrine-DM-GG (ROBITUSSIN COLD & COUGH PO) As directed per bottle when needed for cough      . sertraline (ZOLOFT) 50 MG tablet Take 2  tablets daily.      . sucralfate (CARAFATE) 1 G tablet Take 1 g by mouth as needed.       . tiotropium (SPIRIVA) 18 MCG inhalation capsule Place 18 mcg into inhaler and inhale daily.        [name] levalbuterol (XOPENEX) 0.63 MG/3ML nebulizer solution Take 1 ampule by nebulization every 4 (four) hours as needed.         No current facility-administered medications on file prior to visit.     Past Medical History  Diagnosis Date  . Hyperlipidemia   . COPD (chronic obstructive pulmonary disease)   .  Depression   . GERD (gastroesophageal reflux disease)   . Colon polyps 2008    colonoscopy  . Status post dilation of esophageal narrowing 2000  . Peptic ulcer   . Coronary artery disease 11/2009    Anterior MI with late presentation 11/2009. Cath showed a 100% proximal LAD occlusion, 99% mid RCA. Had a PCI and 2 DES to both LAD and RCA. EF was 35%. Nuclear stress test 05/13: prior anterior/inferior infarcts without ischemic, EF 43%  . Hypertension   . CARDIOMYOPATHY 03/24/2010    did not tolerate Toprol and Lisinopril.   . Insomnia   . Hemorrhoids   . MI (myocardial infarction)   . Atherosclerosis   . Anxiety   . Headache   . Tinea pedis   . Myalgia   . Helicobacter pylori (H. pylori)   . Thyroid dysfunction   . Chest pain   . Back pain   .  Hernia     inguinal  . Allergic rhinitis   . Bronchitis     09-14-11  . Chronic airway obstruction, not elsewhere classified      Past Surgical History  Procedure Laterality Date  . Penile prosthesis implant    . Esophagogastroduodenoscopy  2008  . Colonoscopy    . Esophageal dilation    . Cardiac catheterization    . Coronary angioplasty with stent placement  09/2009    LAD 3.0 X23 mm Xience DES, RCA: 4.0 X 15 mm Xience DES     Family History  Problem Relation Age of Onset  . Heart attack Brother     Brother #1  . Diabetes Brother     brother #2  . Hypertension Brother     #3  . Coronary artery disease Father 36    deceased  . COPD Mother 83    deceased  . Heart attack Sister 46    acute-cocaine abuse/deceased  . Heart attack Brother 37    #4 deceased  . Diabetes Father      History   Social History  . Marital Status: Married    Spouse Name: N/A    Number of Children: 5  . 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 Smoking status: Current Every Day Smoker -- 1.00 packs/day for 42 years    Types: Cigarettes  . Smokeless tobacco: Never Used  . Alcohol Use: No  . Drug Use: No  . Sexually Active: Yes   Other Topics Concern  . Not on file   Social History Narrative   Marital status: married      Children:  1 stepchild and 15 grandchildren      Lives: with wife      Employment:  Renovations; home repairs      Tobacco: 1-2 ppd      Alcohol: none      Drugs: none      Exercise: no formal exercise; physically demanding job; owns horses.      PHYSICAL EXAM   BP 140/80  Pulse 71  Ht 5\' 10"  (1.778 m)  Wt 162 lb (73.483 kg)  BMI 23.24 kg/m2 Constitutional: He is oriented to person, place, and time. He appears well-developed and well-nourished. No distress.  HENT: No nasal discharge.  Head: Normocephalic and atraumatic.  Eyes: Pupils are equal and round. Right eye exhibits no  discharge. Left eye exhibits no discharge.  Neck: Normal range of motion. Neck supple. No JVD  present. No thyromegaly present.  Cardiovascular: Normal rate, regular rhythm, normal heart sounds and. Exam reveals no gallop and no friction rub. No murmur heard.  Pulmonary/Chest: Effort normal and diminished breath sounds. No stridor. No respiratory distress. He has no wheezes. He has no rales. He exhibits no tenderness.  Abdominal: Soft. Bowel sounds are normal. He exhibits no distension. There is no tenderness. There is no rebound and no guarding.  Musculoskeletal: Normal range of motion. He exhibits no edema and no tenderness.  Neurological: He is alert and oriented to person, place, and time. Coordination normal.  Skin: Skin is warm and dry. No rash noted. He is not diaphoretic. No erythema. No pallor.  Psychiatric: He has a normal mood and affect. His behavior is normal. Judgment and thought content normal.       EKG: Sinus  Rhythm  -Incomplete right bundle branch block.   -Old anterior infarct.   -Nonspecific ST depression   +   Nonspecific T-abnormality  -Nondiagnostic.   ABNORMAL    ASSESSMENT AND PLAN

## 2013-01-30 NOTE — Assessment & Plan Note (Signed)
I will reevaluate his ejection fraction during cardiac catheterization. He did not tolerate treatment with a beta blocker and ACE inhibitor in the past.

## 2013-01-30 NOTE — Telephone Encounter (Signed)
Pt requested 90 day supply to be sent to [facility]

## 2013-01-30 NOTE — Patient Instructions (Addendum)

## 2013-01-31 ENCOUNTER — Encounter: Payer: BC Managed Care – PPO | Admitting: Family Medicine

## 2013-01-31 LAB — CBC WITH DIFFERENTIAL
Basophils Absolute: 0 10*3/uL (ref 0.0–0.2)
Basos: 0 % (ref 0–3)
Eos: 2 % (ref 0–5)
HCT: 40.2 % (ref 37.5–51.0)
Hemoglobin: 13.7 g/dL (ref 12.6–17.7)
Immature Grans (Abs): 0 10*3/uL (ref 0.0–0.1)
Immature Granulocytes: 0 % (ref 0–2)
Lymphs: 25 % (ref 14–46)
Monocytes: 9 % (ref 4–12)
Neutrophils Absolute: 3.9 10*3/uL (ref 1.4–7.0)
RBC: 4.75 x10E6/uL (ref 4.14–5.80)
WBC: 6.1 10*3/uL (ref 3.4–10.8)

## 2013-01-31 LAB — BASIC METABOLIC PANEL
Calcium: 9.5 mg/dL (ref 8.7–10.2)
Creatinine, Ser: 0.73 mg/dL — ABNORMAL LOW (ref 0.76–1.27)
GFR calc non Af Amer: 102 mL/min/{1.73_m2} (ref >59)
Glucose: 92 mg/dL (ref 65–99)
Sodium: 144 mmol/L (ref 134–144)

## 2013-01-31 LAB — PROTIME-INR: Prothrombin Time: 10.7 s (ref 9.1–12.0)

## 2013-02-05 ENCOUNTER — Other Ambulatory Visit: Payer: Self-pay

## 2013-02-05 ENCOUNTER — Ambulatory Visit: Payer: BC Managed Care – PPO | Admitting: Pulmonary Disease

## 2013-02-05 ENCOUNTER — Ambulatory Visit: Payer: Self-pay | Admitting: Cardiovascular Disease

## 2013-02-05 DIAGNOSIS — I2510 Atherosclerotic heart disease of native coronary artery without angina pectoris: Secondary | ICD-10-CM

## 2013-02-05 HISTORY — PX: CARDIAC CATHETERIZATION: SHX172

## 2013-02-05 MED ORDER — CLOPIDOGREL BISULFATE 75 MG PO TABS: 75.0000 mg | ORAL_TABLET | Freq: Every day | ORAL | Status: DC

## 2013-02-05 NOTE — Telephone Encounter (Signed)
Refill sent for plavix 75 mg [facility]

## 2013-02-05 NOTE — Telephone Encounter (Signed)
LMOM to have [facility].

## 2013-02-08 ENCOUNTER — Telehealth: Payer: Self-pay | Admitting: Pulmonary Disease

## 2013-02-08 NOTE — Telephone Encounter (Signed)
I spoke with pt. He stated he thought that Dr. Kendrick Fries wanted to set up a CT scan for him at last OV. I advised him that Dr. Kendrick Fries stated if he wasn't feeling better then he would consider a CT scan per OV note. He has an appt coming up with Dr. Kendrick Fries and he stated he wants to wait and see him first before anything is decided. Will sign off message

## 2013-02-09 ENCOUNTER — Ambulatory Visit (INDEPENDENT_AMBULATORY_CARE_PROVIDER_SITE_OTHER): Payer: BC Managed Care – PPO

## 2013-02-09 VITALS — BP 118/70 | HR 91 | Ht 70.0 in | Wt 162.0 lb

## 2013-02-09 DIAGNOSIS — R079 Chest pain, unspecified: Secondary | ICD-10-CM

## 2013-02-09 NOTE — Progress Notes (Signed)
Pt walked in office c/o CP, left arm pain s/p left heart cath Monday 02/05/13 Per Dr. Kirke Corin, cath showed no new blockages and stents were patent Pt says he has been feeling poorly since Wednesday 2/19 EKG performed Reviewed with Dr. Kirke Corin I reassured pt, per Dr. Kirke Corin, EKG was unchanged from prior EKG and all stents were patent  He advises pt to f/u with pulmonology since symptoms may be secondary to pulm issues Pt verb understanding

## 2013-02-09 NOTE — Patient Instructions (Addendum)
Follow up with pulmonology

## 2013-02-11 ENCOUNTER — Telehealth: Payer: Self-pay | Admitting: *Deleted

## 2013-02-11 MED ORDER — LORAZEPAM 0.5 MG PO TABS: 0.5000 mg | ORAL_TABLET | Freq: Every day | ORAL | Status: DC

## 2013-02-11 NOTE — Telephone Encounter (Signed)
[facility] requesting refill on lorazepam 0.5mg .  Last fill 12/22/12.  Pt has appt on 02/14/13

## 2013-02-11 NOTE — Telephone Encounter (Signed)
Refill approved; please call rx into [facility]; I am out of the office today.  Thanks!

## 2013-02-11 NOTE — Telephone Encounter (Signed)
rx called in

## 2013-02-12 ENCOUNTER — Encounter: Payer: Self-pay | Admitting: *Deleted

## 2013-02-13 ENCOUNTER — Encounter: Payer: Self-pay | Admitting: Pulmonary Disease

## 2013-02-13 ENCOUNTER — Ambulatory Visit (INDEPENDENT_AMBULATORY_CARE_PROVIDER_SITE_OTHER): Payer: BC Managed Care – PPO | Admitting: Pulmonary Disease

## 2013-02-13 VITALS — BP 132/80 | HR 85 | Temp 98.0°F | Ht 70.0 in | Wt 163.0 lb

## 2013-02-13 DIAGNOSIS — J449 Chronic obstructive pulmonary disease, unspecified: Secondary | ICD-10-CM

## 2013-02-13 NOTE — Patient Instructions (Signed)
Continue to take the spiriva, advair, and albuterol as you are doing Start going to pulmonary rehab Quit smoking Keep the prescription for doxycycline to use as needed for increasing chest congestion, change in the color of your sputum, and worsening shortness of breath.  Call us if you are not improved after taking it for 1-2 days. We will see you back in 6 weeks or sooner if needed

## 2013-02-13 NOTE — Assessment & Plan Note (Signed)
Leslie Sims was hospitalized in January 2014 for a COPD exacerbation. 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 The following day he developed increasing shortness of breath. Next line he appears to have improved but his ongoing tobacco use is certainly contributing to his COPD.  I also question whether or not there is a component of ABPA based on the mold in his house, this sudden spike in respiratory symptoms in 2013, and the positive Aspergillus serologies.  Plan:  -educated again today to quit smoking -Start pulmonary rehabilitation -Continue Spiriva, Advair, when necessary albuterol -He is to followup with allergy in March to evaluate for ABPA -Followup late March

## 2013-02-13 NOTE — Progress Notes (Signed)
Subjective:    Patient ID: Leslie Sims, male    DOB: 1951/02/17, 62 y.o.   MRN: [id number]  [name] first saw the [facility] clinic in August 2013 for COPD.  He has smoked 1.5 PPD for greater than 40 years and continued to smoke when we saw him.  His simple spirometry showed an FEV 1 1.15L (31% pred) and a CXR in 05/2012 showed emphysema.   HPI   11/14/2012 ROV -- [name] returns to clinic today stating that he was recently seen in urgent care facility for sinus congestion and shortness of breath. He was started on prednisone and loratadine as well as fluticasone and he says that this has helped some. He finished taking the prednisone today any shortness of breath and wheezing are a little better. He is still coughing green to brown sputum up on a regular basis. He denies fevers chills, chest pain, or weight loss. He is been a little stressed out lately because his wife had surgery and he's had to be taking care of her at home. He wants to quit smoking but as of now he is still smoking about a pack a day.  01/02/13 sick visit-- [name] has experienced increased sinus congestion, green sputum production, chest tightness, and increasing shortness of breath over the last 4 days. He has not experienced fever or chest pain. He has been using all of his inhalers and his saline rinses and nasal steroid as written. He continues to smoke cigarettes. He was seen in December 2013 for a flare of COPD. He is unaware of any sick contacts in the last few days. He is considering starting use of the e e-cigarette.  02/13/2013 ROV -- [name] had a left heart catheterization recently which showed no evidence of obstructive coronary disease. (By report, I do not have studies.) He states that he was hospitalized in January 2014 for an exacerbation of COPD. 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 He was discharged on an antibiotic as well as prednisone. He  is now feeling much better and is back to work. He still feels that his exercise tolerance is low compared to 6 months ago. He still produces some white phlegm. He has not had to use his rescue inhaler lately.    Past Medical History  Diagnosis Date  . Hyperlipidemia   . COPD (chronic obstructive pulmonary disease)   . Depression   . GERD (gastroesophageal reflux disease)   . Colon polyps 2008    colonoscopy  . Status post dilation of esophageal narrowing 2000  . Peptic ulcer   . Coronary artery disease 11/2009    Anterior MI with late presentation 11/2009. Cath showed a 100% proximal LAD occlusion, 99% mid RCA. Had a PCI and 2 DES to both LAD and RCA. EF was 35%. Nuclear stress test 05/13: prior anterior/inferior infarcts without ischemic, EF 43%  . Hypertension   . CARDIOMYOPATHY 03/24/2010    did not tolerate Toprol and Lisinopril.   . Insomnia   . Hemorrhoids   . MI (myocardial infarction)   . Atherosclerosis   . Anxiety   . Headache   . Tinea pedis   . Myalgia   . Helicobacter pylori (H. pylori)   . Thyroid dysfunction   . Chest pain   . Back pain   . Hernia     inguinal  . Allergic rhinitis   . Bronchitis     09-14-11  . Chronic  airway obstruction, not elsewhere classified      Review of Systems  Constitutional: Negative for fever, chills and fatigue.  HENT: Negative for congestion, rhinorrhea and postnasal drip.   Respiratory: Positive for shortness of breath. Negative for cough and wheezing.   Cardiovascular: Negative for chest pain, palpitations and leg swelling.       Objective:   Physical Exam   Filed Vitals:   02/13/13 0929  BP: 132/80  Pulse: 85  Height: 5\' 10"  (1.778 m)  Weight: 163 lb (73.936 kg)  SpO2: 96%    Gen: Mildly ill appearing, no acute distress HEENT: NCAT, EOMi PULM: Clear to auscultation today CV: RRR, no mgr, no JVD AB: BS+, soft, nontender, no hsm Ext: warm, no edema, no clubbing, no cyanosis   June 2013 chest x-ray  personally reviewed by me: Emphysema bilaterally  August 2013 simple spirometry: Ratio 45% FEV1 1.15 L (31% predicted) December 20th 2013 chest x-ray-bilateral emphysema no focal infiltrate     Assessment & Plan:   COPD [name] was hospitalized in January 2014 for a COPD exacerbation. 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 The following day he developed increasing shortness of breath. Next line he appears to have improved but his ongoing tobacco use is certainly contributing to his COPD.  I also question whether or not there is a component of ABPA based on the mold in his house, this sudden spike in respiratory symptoms in 2013, and the positive Aspergillus serologies.  Plan:  -educated again today to quit smoking -Start pulmonary rehabilitation -Continue Spiriva, Advair, when necessary albuterol -He is to followup with allergy in March to evaluate for ABPA -Followup late March    Updated Medication List Outpatient Encounter Prescriptions as of 02/13/2013  Medication Sig Dispense Refill  . albuterol (VENTOLIN HFA) 108 (90 BASE) MCG/ACT inhaler Inhale 2 puffs into the lungs every 6 (six) hours as needed. For wheezing      . aspirin 81 MG tablet Take 81 mg by mouth daily.        [name] atorvastatin (LIPITOR) 20 MG tablet Take 1 tablet (20 mg total) by mouth daily.  7 tablet  0  . Budesonide (PULMICORT FLEXHALER) 90 MCG/ACT inhaler Inhale 1 puff into the lungs 2 (two) times daily as needed.      . clopidogrel (PLAVIX) 75 MG tablet Take 1 tablet (75 mg total) by mouth daily.  90 tablet  3  . Dextromethorphan-Guaifenesin (MUCINEX FAST-MAX DM MAX) 5-100 MG/5ML LIQD Take 20 mg by mouth as needed.       [name] esomeprazole (NEXIUM) 40 MG capsule Take 1 capsule (40 mg total) by mouth daily before breakfast.  7 capsule  0  . fluticasone (FLONASE) 50 MCG/ACT nasal spray Place 2 sprays into the nose daily.      .  Fluticasone-Salmeterol (ADVAIR DISKUS) 500-50 MCG/DOSE AEPB Inhale 1 puff into the lungs every 12 (twelve) hours.        . Hypertonic Nasal Wash (SINUS RINSE NA) Place 1 Package into the nose as needed.       . levalbuterol (XOPENEX) 0.63 MG/3ML nebulizer solution Take 1 ampule by nebulization every 4 (four) hours as needed.        . loratadine (CLARITIN) 10 MG tablet Take 10 mg by mouth daily.      [name] LORazepam (ATIVAN) 0.5 MG tablet Take 1 tablet (0.5 mg total) by mouth at  bedtime.  30 tablet  0  . nitroGLYCERIN (NITROSTAT) 0.4 MG SL tablet Place 1 tablet (0.4 mg total) under the tongue every 5 (five) minutes as needed.  25 tablet  3  . Pseudoephedrine-DM-GG (ROBITUSSIN COLD & COUGH PO) As directed per bottle when needed for cough      . sertraline (ZOLOFT) 50 MG tablet Take 2  tablets daily.      . sucralfate (CARAFATE) 1 G tablet Take 1 g by mouth as needed.       . tiotropium (SPIRIVA) 18 MCG inhalation capsule Place 18 mcg into inhaler and inhale daily.        . vitamin C (ASCORBIC ACID) 500 MG tablet Take 500 mg by mouth daily.       No facility-administered encounter medications on file as of 02/13/2013.

## 2013-02-14 ENCOUNTER — Ambulatory Visit (INDEPENDENT_AMBULATORY_CARE_PROVIDER_SITE_OTHER): Payer: BC Managed Care – PPO | Admitting: Family Medicine

## 2013-02-14 ENCOUNTER — Encounter: Payer: Self-pay | Admitting: Family Medicine

## 2013-02-14 ENCOUNTER — Other Ambulatory Visit: Payer: Self-pay | Admitting: Radiology

## 2013-02-14 VITALS — BP 117/70 | HR 89 | Temp 97.7°F | Resp 16 | Ht 69.5 in | Wt 163.0 lb

## 2013-02-14 DIAGNOSIS — J449 Chronic obstructive pulmonary disease, unspecified: Secondary | ICD-10-CM

## 2013-02-14 DIAGNOSIS — D126 Benign neoplasm of colon, unspecified: Secondary | ICD-10-CM

## 2013-02-14 DIAGNOSIS — I2510 Atherosclerotic heart disease of native coronary artery without angina pectoris: Secondary | ICD-10-CM

## 2013-02-14 DIAGNOSIS — K219 Gastro-esophageal reflux disease without esophagitis: Secondary | ICD-10-CM

## 2013-02-14 DIAGNOSIS — Z0000 Encounter for general adult medical examination without abnormal findings: Secondary | ICD-10-CM

## 2013-02-14 DIAGNOSIS — J441 Chronic obstructive pulmonary disease with (acute) exacerbation: Secondary | ICD-10-CM

## 2013-02-14 LAB — CBC WITH DIFFERENTIAL/PLATELET
Basophils Absolute: 0 10*3/uL (ref 0.0–0.1)
Eosinophils Relative: 2 % (ref 0–5)
HCT: 40.6 % (ref 39.0–52.0)
Hemoglobin: 14 g/dL (ref 13.0–17.0)
Lymphocytes Relative: 16 % (ref 12–46)
Lymphs Abs: 1.4 10*3/uL (ref 0.7–4.0)
MCV: 82.5 fL (ref 78.0–100.0)
Monocytes Absolute: 0.9 10*3/uL (ref 0.1–1.0)
Neutro Abs: 6.1 10*3/uL (ref 1.7–7.7)
RBC: 4.92 MIL/uL (ref 4.22–5.81)
WBC: 8.6 10*3/uL (ref 4.0–10.5)

## 2013-02-14 LAB — COMPREHENSIVE METABOLIC PANEL
AST: 15 U/L (ref 0–37)
Albumin: 4.3 g/dL (ref 3.5–5.2)
Alkaline Phosphatase: 69 U/L (ref 39–117)
BUN: 6 mg/dL (ref 6–23)
Calcium: 9.9 mg/dL (ref 8.4–10.5)
Chloride: 102 mEq/L (ref 96–112)
Glucose, Bld: 105 mg/dL — ABNORMAL HIGH (ref 70–99)
Potassium: 4.2 mEq/L (ref 3.5–5.3)
Sodium: 140 mEq/L (ref 135–145)
Total Protein: 6.4 g/dL (ref 6.0–8.3)

## 2013-02-14 LAB — POCT URINALYSIS DIPSTICK
Leukocytes, UA: NEGATIVE
Nitrite, UA: NEGATIVE
Protein, UA: NEGATIVE
pH, UA: 7

## 2013-02-14 LAB — LIPID PANEL
Cholesterol: 142 mg/dL (ref 0–200)
Total CHOL/HDL Ratio: 3 Ratio
VLDL: 15 mg/dL (ref 0–40)

## 2013-02-14 MED ORDER — NYSTATIN-TRIAMCINOLONE 100000-0.1 UNIT/GM-% EX OINT: TOPICAL_OINTMENT | Freq: Three times a day (TID) | CUTANEOUS | Status: DC

## 2013-02-14 NOTE — Progress Notes (Signed)
  Subjective:    Patient ID: Leslie Sims, male    DOB: [birth year], 62 y.o.   MRN: [id number]  HPI    Review of Systems  Constitutional: Positive for fatigue.  HENT: Positive for congestion, neck stiffness and sinus pressure.   Respiratory: Positive for cough, chest tightness, shortness of breath and wheezing.   Gastrointestinal: Positive for abdominal pain and anal bleeding.  Musculoskeletal: Positive for arthralgias.  Neurological: Positive for headaches.  Hematological: Bruises/bleeds easily.       Objective:   Physical Exam        Assessment & Plan:

## 2013-02-14 NOTE — Progress Notes (Signed)
[address]   [place], [place]  [address]   [phone]  Subjective:    Patient ID: Leslie Sims, male    DOB: 1951/04/13, 62 y.o.   MRN: [id number]  HPI This 62 y.o. male presents to establish care and for CPE.    Last CPE 02/2012. TDAP 11/2011. Pneumovax 2008. Zostavax never. Flu vaccine 2013. Colonoscopy 2008.  ; repeat 08/2012; repeat in one year.  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 Dental exam every six months.  Dr. Karolee Stamps at [facility].   COPD Exacerbation: s/p hospitalization at [facility] 12/2012; five day admission; referral to allergist Clint Young on 02/2013.  Worried about allergy component.  Now smoking 10-15 cigarettes per day.  Wife quit smoking 01/20/13.  No smoking in the house.  Goes outside to smoke.  Will not smoke in front of wife.   Pulmonology has recommended Pulmonary rehab and going to see if insurance will cover.  Chest pain: s/p cardiac catheterization earlier in month for recurrent L sided chest pain; no significant CAD identified; stents patent.     Review of Systems  Constitutional: Positive for fatigue. Negative for fever, chills, diaphoresis, activity change, appetite change and unexpected weight change.  HENT: Positive for congestion. Negative for hearing loss, ear pain, nosebleeds, sore throat, facial swelling, rhinorrhea, sneezing, drooling, mouth sores, trouble swallowing, neck pain, neck stiffness, dental problem, voice change, postnasal drip, sinus pressure, tinnitus and ear discharge.   Eyes: Negative for photophobia, pain, discharge, redness, itching and visual disturbance.  Respiratory: Positive for shortness of breath. Negative for cough, choking, chest tightness, wheezing and stridor.   Cardiovascular: Positive for chest pain. Negative for palpitations and leg swelling.  Gastrointestinal: Positive for anal bleeding.  Negative for nausea, vomiting, abdominal pain, diarrhea, constipation, blood in stool, abdominal distention and rectal pain.  Endocrine: Negative for cold intolerance, heat intolerance, polydipsia, polyphagia and polyuria.  Genitourinary: Negative for dysuria, urgency, frequency, hematuria, flank pain, decreased urine volume, discharge, penile swelling, scrotal swelling, enuresis, difficulty urinating, genital sores, penile pain and testicular pain.  Skin: Positive for color change and rash. Negative for pallor and wound.       R foot rash has worsened; +itching; +scaling; +thickened toenails.  Allergic/Immunologic: Negative for immunocompromised state.  Neurological: Positive for headaches. Negative for dizziness, tremors, seizures, syncope, facial asymmetry, speech difficulty, weakness, light-headedness and numbness.  Hematological: Negative for adenopathy. Does not bruise/bleed easily.  Psychiatric/Behavioral: Negative for suicidal ideas, hallucinations, behavioral problems, confusion, sleep disturbance, self-injury, dysphoric mood, decreased concentration and agitation. The patient is not nervous/anxious and is not hyperactive.         Past Medical History  Diagnosis Date  . Hyperlipidemia   . COPD (chronic obstructive pulmonary disease)   . Depression   . GERD (gastroesophageal reflux disease)   . Colon polyps 08/2012    colonoscopy; multiple colon polyps; repeat colonoscopy in one year.  . Status post dilation of esophageal narrowing 2000  . Peptic ulcer   . Coronary artery disease 11/2009    Anterior MI with late presentation 11/2009. Cath showed a 100% proximal LAD occlusion, 99% mid RCA. Had a PCI and 2 DES to both LAD and RCA. EF was 35%. Nuclear stress test 05/13: prior anterior/inferior infarcts without ischemic, EF 43%  . Hypertension   . CARDIOMYOPATHY 03/24/2010    did not tolerate Toprol and Lisinopril.   . Insomnia   .  Hemorrhoids   . MI (myocardial infarction)   .  Atherosclerosis   . Anxiety   . Headache   . Tinea pedis   . Myalgia   . Helicobacter pylori (H. pylori)   . Thyroid dysfunction   . Chest pain   . Back pain   . Hernia     inguinal  . Allergic rhinitis   . Bronchitis     09-14-11  . Chronic airway obstruction, not elsewhere classified     Past Surgical History  Procedure Laterality Date  . Penile prosthesis implant    . Esophagogastroduodenoscopy  2008  . Colonoscopy    . Esophageal dilation    . Cardiac catheterization    . Coronary angioplasty with stent placement  09/2009    LAD 3.0 X23 mm Xience DES, RCA: 4.0 X 15 mm Xience DES  . Cardiac catheterization  02/05/13    [facility]  . Hernia repair  07/20/2012    L inguinal hernia repair    Prior to Admission medications   Medication Sig Start Date End Date Taking? Authorizing Provider  albuterol (VENTOLIN HFA) 108 (90 BASE) MCG/ACT inhaler Inhale 2 puffs into the lungs every 6 (six) hours as needed. For wheezing   Yes Historical Provider, MD  aspirin 81 MG tablet Take 81 mg by mouth daily.     Yes Historical Provider, MD  atorvastatin (LIPITOR) 20 MG tablet Take 1 tablet (20 mg total) by mouth daily. 01/03/13 01/03/14 Yes Antonieta Iba, MD  clopidogrel (PLAVIX) 75 MG tablet Take 1 tablet (75 mg total) by mouth daily. 02/05/13  Yes Antonieta Iba, MD  Dextromethorphan-Guaifenesin (MUCINEX FAST-MAX DM MAX) 5-100 MG/5ML LIQD Take 20 mg by mouth as needed.    Yes Historical Provider, MD  esomeprazole (NEXIUM) 40 MG capsule Take 1 capsule (40 mg total) by mouth daily before breakfast. 01/03/13  Yes Antonieta Iba, MD  fluticasone (FLONASE) 50 MCG/ACT nasal spray Place 2 sprays into the nose daily.   Yes Historical Provider, MD  Fluticasone-Salmeterol (ADVAIR DISKUS) 500-50 MCG/DOSE AEPB Inhale 1 puff into the lungs every 12 (twelve) hours.     Yes Historical Provider, MD  levalbuterol Pauline Aus) 0.63 MG/3ML nebulizer solution Take 1 ampule by nebulization every 4 (four) hours as  needed.     Yes Historical Provider, MD  loratadine (CLARITIN) 10 MG tablet Take 10 mg by mouth daily.   Yes Historical Provider, MD  LORazepam (ATIVAN) 0.5 MG tablet Take 1 tablet (0.5 mg total) by mouth at bedtime. 02/11/13  Yes [name], MD  nitroGLYCERIN (NITROSTAT) 0.4 MG SL tablet Place 1 tablet (0.4 mg total) under the tongue every 5 (five) minutes as needed. 05/08/12  Yes Iran Ouch, MD  Pseudoephedrine-DM-GG (ROBITUSSIN COLD & COUGH PO) As directed per bottle when needed for cough   Yes Historical Provider, MD  sertraline (ZOLOFT) 50 MG tablet Take 2  tablets daily.   Yes Historical Provider, MD  sucralfate (CARAFATE) 1 G tablet Take 1 g by mouth as needed.    Yes Historical Provider, MD  tiotropium (SPIRIVA) 18 MCG inhalation capsule Place 18 mcg into inhaler and inhale daily.     Yes Historical Provider, MD  vitamin C (ASCORBIC ACID) 500 MG tablet Take 500 mg by mouth daily.   Yes Historical Provider, MD  Hypertonic Nasal Wash (SINUS RINSE NA) Place 1 Package into the nose as needed.     Historical Provider, MD  nystatin-triamcinolone ointment (MYCOLOG) Apply topically 3 (three) times  daily. 02/14/13   [name], MD    Allergies  Allergen Reactions  . Wellbutrin (Bupropion) Other (See Comments)    Unknown/"made me feel real funny"    History   Social History  . Marital Status: Married    Spouse Name: N/A    Number of Children: 5  . 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 Smoking status: Current Every Day Smoker -- 1.00 packs/day for 42 years    Types: Cigarettes  . Smokeless tobacco: Never Used  . Alcohol Use: No  . Drug Use: No  . Sexually Active: Yes   Other Topics Concern  . Not on file   Social History Narrative   Marital status: married x 31 years; happily married.      Children: 4 children;  1 stepchild and 15 grandchildren; no great grandchildren.      Lives: with wife; 2  dogs, 1 cat.      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      Tobacco: 1-2 ppd x 45 years      Alcohol: none      Drugs: none      Exercise: no formal exercise; physically demanding job; owns horses.      Seatbelts:  100%      Guns: one loaded unsecured gun in the home.      Sunscreen: none    Family History  Problem Relation Age of Onset  . Heart attack Brother     Brother #1  . Diabetes Brother     brother #2  . Hypertension Brother     #3  . Coronary artery disease Father 43    deceased  . COPD Mother 56    deceased  . Heart attack Sister 78    acute-cocaine abuse/deceased  . Heart attack Brother 49    #4 deceased  . Diabetes Father     Objective:   Physical Exam  Nursing note and vitals reviewed. Constitutional: He is oriented to person, place, and time. He appears well-developed and well-nourished. No distress.  HENT:  Head: Normocephalic and atraumatic.  Right Ear: External ear normal.  Left Ear: External ear normal.  Nose: Nose normal.  Mouth/Throat: Oropharynx is clear and moist.  Eyes: Conjunctivae and EOM are normal. Pupils are equal, round, and reactive to light.  Neck: Normal range of motion. Neck supple. No JVD present. No thyromegaly present.  Cardiovascular: Normal rate, regular rhythm, normal heart sounds and intact distal pulses.  Exam reveals no gallop and no friction rub.   No murmur heard. Pulmonary/Chest: Effort normal. No respiratory distress. He has no wheezes. He has no rales.  +distant breath sounds throughout.  Abdominal: Soft. Bowel sounds are normal. He exhibits no distension and no mass. There is no tenderness. There is no rebound and no guarding. Hernia confirmed negative in the right inguinal area and confirmed negative in the left inguinal area.  Genitourinary: Rectum normal, prostate normal, testes normal and penis normal. Rectal exam shows no mass. Prostate is not tender. Right testis shows no mass, no swelling and  no tenderness. Left testis shows no mass, no swelling and no tenderness.  Musculoskeletal: Normal range of motion.  Lymphadenopathy:    He has no cervical adenopathy.       Right: No inguinal adenopathy present.       Left: No inguinal adenopathy present.  Neurological: He  is alert and oriented to person, place, and time. He has normal reflexes. No cranial nerve deficit. He exhibits normal muscle tone. Coordination normal.  Skin: Rash noted. He is not diaphoretic.  R FOOT: DIFFUSE ERYTHEMA WITH SCALING MEDIAL>LATERAL.    Psychiatric: He has a normal mood and affect. His behavior is normal. Judgment and thought content normal.        Assessment & Plan:  Annual physical exam - Plan: CBC with Differential, CK, Comprehensive metabolic panel, Hemoglobin A1c, Lipid panel, PSA, Vitamin B12, Vitamin D 25 hydroxy, POCT urinalysis dipstick, Folate, CANCELED: EKG 12-Lead   Meds ordered this encounter  Medications  . nystatin-triamcinolone ointment (MYCOLOG)    Sig: Apply topically 3 (three) times daily.    Dispense:  60 g    Refill:  0

## 2013-02-14 NOTE — Patient Instructions (Addendum)
Annual physical exam - Plan: CBC with Differential, CK, Comprehensive metabolic panel, Hemoglobin A1c, Lipid panel, PSA, Vitamin B12, Vitamin D 25 hydroxy, POCT urinalysis dipstick, Folate, CANCELED: EKG 12-Lead

## 2013-02-15 LAB — VITAMIN D 25 HYDROXY (VIT D DEFICIENCY, FRACTURES): Vit D, 25-Hydroxy: 25 ng/mL — ABNORMAL LOW (ref 30–89)

## 2013-02-15 LAB — PSA: PSA: 2.1 ng/mL (ref <=4.00)

## 2013-02-18 ENCOUNTER — Encounter: Payer: Self-pay | Admitting: Family Medicine

## 2013-02-18 DIAGNOSIS — Z0000 Encounter for general adult medical examination without abnormal findings: Secondary | ICD-10-CM | POA: Insufficient documentation

## 2013-02-18 NOTE — Assessment & Plan Note (Signed)
S/p recent admission; followed up with pulmonology yesterday. Encourage pulmonary rehab and smoking cessation.  Congratulated on decreased cigarette use over past several weeks.

## 2013-02-18 NOTE — Assessment & Plan Note (Signed)
Stable; s/p recent cardiac catheterization.

## 2013-02-18 NOTE — Assessment & Plan Note (Signed)
Anticipatory guidance --- smoking cessation, increase exercise.  Immunizations UTD.  Colonoscopy UTD.  Obtain labs.

## 2013-02-18 NOTE — Assessment & Plan Note (Signed)
Recent hospitalization; will obtain records.

## 2013-02-19 ENCOUNTER — Ambulatory Visit (INDEPENDENT_AMBULATORY_CARE_PROVIDER_SITE_OTHER): Payer: BC Managed Care – PPO | Admitting: Cardiovascular Disease

## 2013-02-19 ENCOUNTER — Encounter: Payer: Self-pay | Admitting: Cardiovascular Disease

## 2013-02-19 VITALS — BP 148/77 | HR 76 | Ht 70.0 in | Wt 163.8 lb

## 2013-02-19 DIAGNOSIS — I5022 Chronic systolic (congestive) heart failure: Secondary | ICD-10-CM

## 2013-02-19 DIAGNOSIS — I2510 Atherosclerotic heart disease of native coronary artery without angina pectoris: Secondary | ICD-10-CM

## 2013-02-19 DIAGNOSIS — F17200 Nicotine dependence, unspecified, uncomplicated: Secondary | ICD-10-CM

## 2013-02-19 DIAGNOSIS — E785 Hyperlipidemia, unspecified: Secondary | ICD-10-CM

## 2013-02-19 DIAGNOSIS — I428 Other cardiomyopathies: Secondary | ICD-10-CM

## 2013-02-19 MED ORDER — LOSARTAN POTASSIUM 25 MG PO TABS: 25.0000 mg | ORAL_TABLET | Freq: Every day | ORAL | Status: DC

## 2013-02-19 NOTE — Assessment & Plan Note (Signed)
I again discussed with him the importance of smoking cessation. 

## 2013-02-19 NOTE — Assessment & Plan Note (Signed)
Recent cardiac catheterization showed patent stents with no evidence of obstructive coronary artery disease. Ejection fraction was 40%. Continue medical therapy.

## 2013-02-19 NOTE — Patient Instructions (Addendum)
Start Losartan 25 mg once daily.  Labs in week.  Follow up in 3 months.

## 2013-02-19 NOTE — Assessment & Plan Note (Signed)
Lab Results  Component Value Date   CHOL 142 02/14/2013   HDL 47 02/14/2013   LDLCALC 80 02/14/2013   TRIG 75 02/14/2013   CHOLHDL 3.0 02/14/2013   Continue lifelong treatment with a statin.

## 2013-02-19 NOTE — Assessment & Plan Note (Signed)
Most recent ejection fraction was 40% due to ischemic cardiomyopathy. In the past, he did not tolerate a beta blocker due to worsening dyspnea. He also did not tolerate ACE inhibitor due to dizziness. I will start him on small dose losartan 25 mg once daily. Check basic metabolic profile in one week.

## 2013-02-19 NOTE — Progress Notes (Signed)
Primary care physician: Dr. Nilda Simmer  HPI  This is a 62 year old male who is here today for a followup visit. He has known history of coronary artery disease status post anterior myocardial infarction in December of 2012. His LAD was occluded and RCA had a 99% stenosis. He had an angioplasty and drug-eluting stent placement to both LAD and RCA at that time. 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 He cut down to 10 cigarettes a day.  He was seen recently for worsening exertional dyspnea and chest pain. Thus, I decided to proceed with cardiac catheterization via the right radial artery which showed widely patent stents in the LAD and RCA with no evidence of other obstructive disease. Ejection fraction was 40% with mildly elevated left ventricular end-diastolic pressure. He had worsening dyspnea which was thought to be due to COPD.  Allergies  Allergen Reactions  . Wellbutrin (Bupropion) Other (See Comments)    Unknown/"made me feel real funny"     Current Outpatient Prescriptions on File Prior to Visit  Medication Sig Dispense Refill  . albuterol (VENTOLIN HFA) 108 (90 BASE) MCG/ACT inhaler Inhale 2 puffs into the lungs every 6 (six) hours as needed. For wheezing      . aspirin 81 MG tablet Take 81 mg by mouth daily.        [name] atorvastatin (LIPITOR) 20 MG tablet Take 1 tablet (20 mg total) by mouth daily.  7 tablet  0  . clopidogrel (PLAVIX) 75 MG tablet Take 1 tablet (75 mg total) by mouth daily.  90 tablet  3  . Dextromethorphan-Guaifenesin (MUCINEX FAST-MAX DM MAX) 5-100 MG/5ML LIQD Take 20 mg by mouth as needed.       [name] esomeprazole (NEXIUM) 40 MG capsule Take 1 capsule (40 mg total) by mouth daily before breakfast.  7 capsule  0  . fluticasone (FLONASE) 50 MCG/ACT nasal spray Place 2 sprays into the nose daily.      . Fluticasone-Salmeterol (ADVAIR DISKUS) 500-50 MCG/DOSE AEPB Inhale 1 puff into the lungs every 12 (twelve) hours.        . Hypertonic Nasal Wash  (SINUS RINSE NA) Place 1 Package into the nose as needed.       . levalbuterol (XOPENEX) 0.63 MG/3ML nebulizer solution Take 1 ampule by nebulization every 4 (four) hours as needed.        . loratadine (CLARITIN) 10 MG tablet Take 10 mg by mouth daily.      [name] LORazepam (ATIVAN) 0.5 MG tablet Take 1 tablet (0.5 mg total) by mouth at bedtime.  30 tablet  0  . nitroGLYCERIN (NITROSTAT) 0.4 MG SL tablet Place 1 tablet (0.4 mg total) under the tongue every 5 (five) minutes as needed.  25 tablet  3  . nystatin-triamcinolone ointment (MYCOLOG) Apply topically 3 (three) times daily.  60 g  0  . Pseudoephedrine-DM-GG (ROBITUSSIN COLD & COUGH PO) As directed per bottle when needed for cough      . sertraline (ZOLOFT) 50 MG tablet Take 2  tablets daily.      . sucralfate (CARAFATE) 1 G tablet Take 1 g by mouth as needed.       . tiotropium (SPIRIVA) 18 MCG inhalation capsule Place 18 mcg into inhaler and inhale daily.        . vitamin C (ASCORBIC ACID) 500 MG tablet Take 500 mg by mouth daily.       No current facility-administered medications on file prior to visit.  Past Medical History  Diagnosis Date  . Hyperlipidemia   . COPD (chronic obstructive pulmonary disease)   . Depression   . GERD (gastroesophageal reflux disease)   . Colon polyps 08/2012    colonoscopy; multiple colon polyps; repeat colonoscopy in one year.  . Status post dilation of esophageal narrowing 2000  . Peptic ulcer   . Hypertension   . CARDIOMYOPATHY 03/24/2010    did not tolerate Toprol and Lisinopril.   . Insomnia   . Hemorrhoids   . MI (myocardial infarction)   . Atherosclerosis   . Anxiety   . Headache   . Tinea pedis   . Myalgia   . Helicobacter pylori (H. pylori)   . Thyroid dysfunction   . Chest pain   . Back pain   . Hernia     inguinal  . Allergic rhinitis   . Bronchitis     09-14-11  . Chronic airway obstruction, not elsewhere classified   . Coronary artery disease 11/2009    Anterior MI with late  presentation 11/2009. Cath showed a 100% proximal LAD occlusion, 99% mid RCA. Had a PCI and 2 DES to both LAD and RCA. EF was 35%. Nuclear stress test 05/13: prior anterior/inferior infarcts without ischemic, EF 43%, cardiac cath in 02/14: Widely patent stents with no other obstructive disease. EF 40%      Past Surgical History  Procedure Laterality Date  . Penile prosthesis implant    . Esophagogastroduodenoscopy  2008  . Colonoscopy    . Esophageal dilation    . Hernia repair  07/20/2012    L inguinal hernia repair  . Admission  12/20/2012    COPD exacerbation.  [facility].  . Esophagogastroduodenoscopy  08/20/2012  . Cardiac catheterization    . Coronary angioplasty with stent placement  09/2009    LAD 3.0 X23 mm Xience DES, RCA: 4.0 X 15 mm Xience DES  . Cardiac catheterization  02/05/13    [facility]     Family History  Problem Relation Age of Onset  . Heart attack Brother     Brother #1  . Diabetes Brother     brother #2  . Hypertension Brother     #3  . Coronary artery disease Father 29    deceased  . COPD Mother 28    deceased  . Heart attack Sister 33    acute-cocaine abuse/deceased  . Heart attack Brother 74    #4 deceased  . Diabetes Father      History   Social History  . Marital Status: Married    Spouse Name: N/A    Number of Children: 5  . 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 Smoking status: Current Every Day Smoker -- 1.00 packs/day for 42 years    Types: Cigarettes  . Smokeless tobacco: Never Used  . Alcohol Use: No  . Drug Use: No  . Sexually Active: Yes   Other Topics Concern  . Not on file   Social History Narrative   Marital status: married x 31 years; happily married.      Children: 4 children;  1 stepchild and 15 grandchildren; no great grandchildren.      Lives: with wife; 2 dogs, 1 cat.      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      Tobacco: 1-2  ppd x 45 years      Alcohol:  none      Drugs: none      Exercise: no formal exercise; physically demanding job; owns horses.      Seatbelts:  100%      Guns: one loaded unsecured gun in the home.      Sunscreen: none      PHYSICAL EXAM   BP 148/77  Pulse 76  Ht 5\' 10"  (1.778 m)  Wt 163 lb 12 oz (74.277 kg)  BMI 23.5 kg/m2 Constitutional: He is oriented to person, place, and time. He appears well-developed and well-nourished. No distress.  HENT: No nasal discharge.  Head: Normocephalic and atraumatic.  Eyes: Pupils are equal and round. Right eye exhibits no discharge. Left eye exhibits no discharge.  Neck: Normal range of motion. Neck supple. No JVD present. No thyromegaly present.  Cardiovascular: Normal rate, regular rhythm, normal heart sounds and. Exam reveals no gallop and no friction rub. No murmur heard.  Pulmonary/Chest: Effort normal and diminished breath sounds. No stridor. No respiratory distress. He has no wheezes. He has no rales. He exhibits no tenderness.  Abdominal: Soft. Bowel sounds are normal. He exhibits no distension. There is no tenderness. There is no rebound and no guarding.  Musculoskeletal: Normal range of motion. He exhibits no edema and no tenderness.  Neurological: He is alert and oriented to person, place, and time. Coordination normal.  Skin: Skin is warm and dry. No rash noted. He is not diaphoretic. No erythema. No pallor.  Psychiatric: He has a normal mood and affect. His behavior is normal. Judgment and thought content normal.  Right radial pulse is normal with no hematoma.     EKG: Sinus  Rhythm  -RSR(V1) -nondiagnostic.   -Old anterior infarct.   -  Nonspecific T-abnormality.   ABNORMAL    ASSESSMENT AND PLAN

## 2013-02-21 ENCOUNTER — Encounter: Payer: Self-pay | Admitting: Cardiovascular Disease

## 2013-02-22 ENCOUNTER — Telehealth: Payer: Self-pay | Admitting: Radiology

## 2013-02-22 NOTE — Telephone Encounter (Signed)
Patient called back and was advised of lab results and he will take Vitamin D 1000 units daily.

## 2013-02-23 ENCOUNTER — Institutional Professional Consult (permissible substitution): Payer: BC Managed Care – PPO | Admitting: Internal Medicine

## 2013-02-26 ENCOUNTER — Ambulatory Visit (INDEPENDENT_AMBULATORY_CARE_PROVIDER_SITE_OTHER): Payer: BC Managed Care – PPO

## 2013-02-26 VITALS — BP 142/88 | HR 85 | Ht 70.0 in | Wt 160.5 lb

## 2013-02-26 DIAGNOSIS — I5022 Chronic systolic (congestive) heart failure: Secondary | ICD-10-CM

## 2013-02-26 DIAGNOSIS — R079 Chest pain, unspecified: Secondary | ICD-10-CM

## 2013-02-27 LAB — BASIC METABOLIC PANEL
Creatinine, Ser: 0.79 mg/dL (ref 0.76–1.27)
GFR calc Af Amer: 114 mL/min/{1.73_m2} (ref >59)
GFR calc non Af Amer: 98 mL/min/{1.73_m2} (ref >59)
Glucose: 141 mg/dL — ABNORMAL HIGH (ref 65–99)
Potassium: 4.3 mmol/L (ref 3.5–5.2)

## 2013-03-06 ENCOUNTER — Ambulatory Visit: Payer: BC Managed Care – PPO | Admitting: Pulmonary Disease

## 2013-03-09 ENCOUNTER — Other Ambulatory Visit: Payer: Self-pay | Admitting: Radiology

## 2013-03-09 ENCOUNTER — Other Ambulatory Visit: Payer: Self-pay | Admitting: Family Medicine

## 2013-03-09 MED ORDER — LORAZEPAM 0.5 MG PO TABS: 0.5000 mg | ORAL_TABLET | Freq: Every day | ORAL | Status: DC

## 2013-03-09 NOTE — Telephone Encounter (Signed)
Please advise on Lorazepam renewal pended

## 2013-03-12 ENCOUNTER — Other Ambulatory Visit: Payer: Self-pay | Admitting: Radiology

## 2013-03-12 NOTE — Telephone Encounter (Signed)
Faxed Rx [facility]

## 2013-03-14 ENCOUNTER — Other Ambulatory Visit (INDEPENDENT_AMBULATORY_CARE_PROVIDER_SITE_OTHER): Payer: BC Managed Care – PPO | Admitting: Family Medicine

## 2013-03-14 DIAGNOSIS — Z720 Tobacco use: Secondary | ICD-10-CM

## 2013-03-14 DIAGNOSIS — F17200 Nicotine dependence, unspecified, uncomplicated: Secondary | ICD-10-CM

## 2013-03-16 LAB — NICOTINE/COTININE METABOLITES: Cotinine: >100 ng/mL

## 2013-03-21 ENCOUNTER — Ambulatory Visit: Payer: BC Managed Care – PPO | Admitting: Pulmonary Disease

## 2013-03-27 ENCOUNTER — Ambulatory Visit: Payer: BC Managed Care – PPO | Admitting: Pulmonary Disease

## 2013-03-30 ENCOUNTER — Institutional Professional Consult (permissible substitution): Payer: BC Managed Care – PPO | Admitting: Internal Medicine

## 2013-04-16 ENCOUNTER — Telehealth: Payer: Self-pay

## 2013-04-16 NOTE — Telephone Encounter (Signed)
Pt is currenltly out of nexium, pt would like to know if it would be okay to take prilocsec until he receives his refill. Best# [phone]

## 2013-04-17 ENCOUNTER — Other Ambulatory Visit: Payer: Self-pay | Admitting: Cardiovascular Disease

## 2013-04-17 NOTE — Telephone Encounter (Signed)
Yes this would be fine. Called him to advise.

## 2013-04-18 ENCOUNTER — Other Ambulatory Visit: Payer: Self-pay | Admitting: *Deleted

## 2013-04-18 MED ORDER — ATORVASTATIN CALCIUM 20 MG PO TABS: 20.0000 mg | ORAL_TABLET | Freq: Every day | ORAL | Status: DC

## 2013-04-18 NOTE — Telephone Encounter (Signed)
[facility]

## 2013-04-19 ENCOUNTER — Other Ambulatory Visit: Payer: Self-pay

## 2013-04-19 NOTE — Telephone Encounter (Signed)
Patient states [facility] has been calling us about his RX and we have not spoken to them yet. He is completely out of his nexium and has been out for a few days. Needs a 10 day supply called in to [facility] until he can get it from [facility]. Sees Dr. Katrinka Blazing. CB # is [phone].

## 2013-04-20 MED ORDER — ESOMEPRAZOLE MAGNESIUM 40 MG PO CPDR: 40.0000 mg | DELAYED_RELEASE_CAPSULE | Freq: Every day | ORAL | Status: DC

## 2013-04-20 NOTE — Telephone Encounter (Signed)
He was asking earlier in the week if he can take Prilosec, was advised to use this. We have never given him the Nexium, he did see you for his physical recently, pended Rx (for local pharmacy) please advise also we probably need to send in 90 day Rx to [facility]. Naveyah Iacovelli

## 2013-04-20 NOTE — Addendum Note (Signed)
Addended by: [name] on: 04/20/2013 05:39 PM   Modules accepted: Orders

## 2013-04-20 NOTE — Telephone Encounter (Signed)
Nexium rx sent to [facility] and also to [facility].  Please advise pt.  KMS

## 2013-04-23 NOTE — Telephone Encounter (Signed)
Notified pt. 

## 2013-04-27 ENCOUNTER — Other Ambulatory Visit: Payer: Self-pay

## 2013-04-27 MED ORDER — ATORVASTATIN CALCIUM 20 MG PO TABS: 20.0000 mg | ORAL_TABLET | Freq: Every day | ORAL | Status: DC

## 2013-04-27 NOTE — Telephone Encounter (Signed)
Pt states when he received his lipitor in the mail it was only a 30 day supply, he needs a 90 day supply.

## 2013-05-10 ENCOUNTER — Telehealth: Payer: Self-pay

## 2013-05-10 NOTE — Telephone Encounter (Signed)
Was in regards to his wife.

## 2013-05-10 NOTE — Telephone Encounter (Signed)
Pt is returning amy's call from yesterday Call back number is [phone]

## 2013-05-15 ENCOUNTER — Encounter: Payer: Self-pay | Admitting: Family Medicine

## 2013-05-15 ENCOUNTER — Ambulatory Visit (INDEPENDENT_AMBULATORY_CARE_PROVIDER_SITE_OTHER): Payer: BC Managed Care – PPO | Admitting: Family Medicine

## 2013-05-15 ENCOUNTER — Telehealth: Payer: Self-pay | Admitting: *Deleted

## 2013-05-15 VITALS — BP 132/68 | HR 106 | Temp 98.0°F | Resp 18 | Ht 69.5 in | Wt 166.4 lb

## 2013-05-15 DIAGNOSIS — F32A Depression, unspecified: Secondary | ICD-10-CM

## 2013-05-15 DIAGNOSIS — E559 Vitamin D deficiency, unspecified: Secondary | ICD-10-CM

## 2013-05-15 DIAGNOSIS — I2510 Atherosclerotic heart disease of native coronary artery without angina pectoris: Secondary | ICD-10-CM

## 2013-05-15 DIAGNOSIS — R7309 Other abnormal glucose: Secondary | ICD-10-CM

## 2013-05-15 DIAGNOSIS — F341 Dysthymic disorder: Secondary | ICD-10-CM

## 2013-05-15 DIAGNOSIS — G4710 Hypersomnia, unspecified: Secondary | ICD-10-CM

## 2013-05-15 DIAGNOSIS — F329 Major depressive disorder, single episode, unspecified: Secondary | ICD-10-CM

## 2013-05-15 DIAGNOSIS — J449 Chronic obstructive pulmonary disease, unspecified: Secondary | ICD-10-CM

## 2013-05-15 DIAGNOSIS — R899 Unspecified abnormal finding in specimens from other organs, systems and tissues: Secondary | ICD-10-CM

## 2013-05-15 DIAGNOSIS — F419 Anxiety disorder, unspecified: Secondary | ICD-10-CM

## 2013-05-15 NOTE — Progress Notes (Signed)
[address]   [place], [place]  [address]   [phone]  Subjective:    Patient ID: Leslie Sims, male    DOB: January 03, 1951, 62 y.o.   MRN: [id number]  HPI This 62 y.o. male presents for evaluation for four month follow-up:  1.  Sick:  Tired and give out 1.5 weeks ago.  Stopped by [employer]; ate baked potatoe and chilies.  Ate orange slices later.  Vomited x 2 afterwards.  +nausea; no heartburn; no reflux; +epigastric discomfort.  Taking Nexium.  No diarrhea; slight burning.  Taking Carafate not everyday.  Thinks due to [facility] meal; feeling better now.   2.  Sinus congestion: going to sinus specialist in three days.  Dr. Fannie Knee.    3. CHF/HTN: Dr. Kirke Corin prescribed Losartan 25mg  one daily. Pt has not started day.  Very reluctant tot start; wants to discuss with PCP.  4.  Fatigue/hypersomnolence: takes a nap every day; has taken a nap everyday.  Will sleep 30 minutes to 3 hours.  Sleeps 11:00-7:00am.  Falls back asleep 7:30-9:00.   Last sleep study years ago 10-15 years ago.  Known sleep apnea; has never been able to tolerate CPAP.    5.  COPD: breathing worsened 1-2 years ago after chemical/pain/stain exposure.  Continues to smoke.  Compliance with medication.  6.  Depression: very irritable; very short tempered; has worsened in past few weeks. Compliance with medication.  Resistant in past to psychiatry referral.   7. Glucose Intolerance: worsening; not monitoring diet closely.  8.  Vitamin D deficiency: compliance with supplement.   Review of Systems  Constitutional: Positive for fatigue. Negative for fever, chills and diaphoresis.  HENT: Positive for congestion, rhinorrhea and postnasal drip. Negative for ear pain and sinus pressure.   Respiratory: Positive for shortness of breath and wheezing. Negative for cough and stridor.   Cardiovascular: Negative for chest pain, palpitations and leg swelling.  Gastrointestinal: Positive for vomiting. Negative for nausea, abdominal  pain, diarrhea, constipation, blood in stool, abdominal distention, anal bleeding and rectal pain.  Skin: Negative for pallor, rash and wound.  Neurological: Negative for dizziness, tremors, seizures, syncope, facial asymmetry, speech difficulty, weakness, light-headedness, numbness and headaches.  Psychiatric/Behavioral: Positive for dysphoric mood. Negative for suicidal ideas, confusion, sleep disturbance, self-injury and decreased concentration. The patient is nervous/anxious.    Past Medical History  Diagnosis Date  . Hyperlipidemia   . COPD (chronic obstructive pulmonary disease)   . Depression   . GERD (gastroesophageal reflux disease)   . Colon polyps 08/2012    colonoscopy; multiple colon polyps; repeat colonoscopy in one year.  . Status post dilation of esophageal narrowing 2000  . Peptic ulcer   . Hypertension   . CARDIOMYOPATHY 03/24/2010    did not tolerate Toprol and Lisinopril.   . Insomnia   . Hemorrhoids   . MI (myocardial infarction)   . Atherosclerosis   . Anxiety   . Headache(784.0)   . Tinea pedis   . Myalgia   . Helicobacter pylori (H. pylori)   . Thyroid dysfunction   . Chest pain   . Back pain   . Hernia     inguinal  . Allergic rhinitis   . Bronchitis     09-14-11  . Chronic airway obstruction, not elsewhere classified   . Coronary artery disease 11/2009    Anterior MI with late presentation 11/2009. Cath showed a 100% proximal LAD occlusion, 99% mid RCA. Had a PCI and 2 DES to both LAD  and RCA. EF was 35%. Nuclear stress test 05/13: prior anterior/inferior infarcts without ischemic, EF 43%, cardiac cath in 02/14: Widely patent stents with no other obstructive disease. EF 40%    Current Outpatient Prescriptions on File Prior to Visit  Medication Sig Dispense Refill  . albuterol (VENTOLIN HFA) 108 (90 BASE) MCG/ACT inhaler Inhale 2 puffs into the lungs every 6 (six) hours as needed. For wheezing      . aspirin 81 MG tablet Take 81 mg by mouth daily.          [name] atorvastatin (LIPITOR) 20 MG tablet Take 1 tablet (20 mg total) by mouth daily.  90 tablet  3  . clopidogrel (PLAVIX) 75 MG tablet Take 1 tablet (75 mg total) by mouth daily.  90 tablet  3  . Dextromethorphan-Guaifenesin (MUCINEX FAST-MAX DM MAX) 5-100 MG/5ML LIQD Take 20 mg by mouth as needed.       [name] esomeprazole (NEXIUM) 40 MG capsule Take 1 capsule (40 mg total) by mouth daily before breakfast.  90 capsule  3  . fluticasone (FLONASE) 50 MCG/ACT nasal spray Place 2 sprays into the nose daily.      . Fluticasone-Salmeterol (ADVAIR DISKUS) 500-50 MCG/DOSE AEPB Inhale 1 puff into the lungs every 12 (twelve) hours.        . Hypertonic Nasal Wash (SINUS RINSE NA) Place 1 Package into the nose as needed.       . levalbuterol (XOPENEX) 0.63 MG/3ML nebulizer solution Take 1 ampule by nebulization every 4 (four) hours as needed.        . loratadine (CLARITIN) 10 MG tablet Take 10 mg by mouth daily as needed.       [name] LORazepam (ATIVAN) 0.5 MG tablet Take 1 tablet (0.5 mg total) by mouth at bedtime.  30 tablet  5  . losartan (COZAAR) 25 MG tablet Take 1 tablet (25 mg total) by mouth daily.  30 tablet  6  . Pseudoephedrine-DM-GG (ROBITUSSIN COLD & COUGH PO) As directed per bottle when needed for cough      . sertraline (ZOLOFT) 50 MG tablet Take 2  tablets daily.      [name] tiotropium (SPIRIVA) 18 MCG inhalation capsule Place 18 mcg into inhaler and inhale daily.        . vitamin C (ASCORBIC ACID) 500 MG tablet Take 500 mg by mouth daily.      [name] nystatin-triamcinolone ointment (MYCOLOG) Apply topically 3 (three) times daily.  60 g  0   No current facility-administered medications on file prior to visit.   History   Social History  . Marital Status: Married    Spouse Name: N/A    Number of Children: 5  . 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 Smoking status: Current Every Day Smoker -- 1.00 packs/day for 42 years    Types:  Cigarettes  . Smokeless tobacco: Never Used  . Alcohol Use: No  . Drug Use: No  . Sexually Active: Yes   Other Topics Concern  . Not on file   Social History Narrative   Marital status: married x 31 years; happily married.      Children: 4 children;  1 stepchild and 15 grandchildren; no great grandchildren.      Lives: with wife; 2 dogs, 1 cat.      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      Tobacco: 1-2  ppd x 45 years      Alcohol: none      Drugs: none      Exercise: no formal exercise; physically demanding job; owns horses.      Seatbelts:  100%      Guns: one loaded unsecured gun in the home.      Sunscreen: none       Objective:   Physical Exam  Nursing note and vitals reviewed. Constitutional: He is oriented to person, place, and time. He appears well-developed and well-nourished. No distress.  HENT:  Head: Normocephalic and atraumatic.  Right Ear: External ear normal.  Left Ear: External ear normal.  Nose: Nose normal.  Mouth/Throat: Oropharynx is clear and moist.  Eyes: Conjunctivae and EOM are normal. Pupils are equal, round, and reactive to light.  Neck: Normal range of motion. Neck supple. No thyromegaly present.  Cardiovascular: Normal rate, regular rhythm, normal heart sounds and intact distal pulses.  Exam reveals no gallop and no friction rub.   No murmur heard. Pulmonary/Chest: Effort normal and breath sounds normal. He has no wheezes. He has no rales.  Distant breath sounds throughout.  Abdominal: Soft. Bowel sounds are normal. He exhibits no distension. There is no tenderness. There is no rebound and no guarding.  Lymphadenopathy:    He has no cervical adenopathy.  Neurological: He is alert and oriented to person, place, and time. No cranial nerve deficit. He exhibits normal muscle tone. Coordination normal.  Skin: He is not diaphoretic.  Psychiatric: He has a normal mood and affect. His behavior is normal. Judgment and thought  content normal.       Assessment & Plan:  Other abnormal glucose - Plan: Comprehensive metabolic panel, Hemoglobin A1c  Unspecified vitamin D deficiency - Plan: Vitamin D 25 hydroxy  Hypersomnolence - Plan: Ambulatory referral to Sleep Studies  Anxiety and depression - Plan: Ambulatory referral to Psychiatry  CAD, NATIVE VESSEL  COPD   1.  Glucose Intolerance: worsening; repeat labs; recommend weight loss, exercise, low-sugar food intake. 2.  Vitamin D deficiency: uncontrolled; obtain labs. 3.  Hypersomnolence:  Worsening; refer for sleep study; history of OSA but intolerant to CPAP machine. 4.  Anxiety and depression: worsening; referral to psychiatry.  No changes in management at this time. 5.  CAD: stable; advised to start Losartan therapy as directed by cardiology. 6. COPD: stable; no acute issues; progressively worsening with continued tobacco use.

## 2013-05-15 NOTE — Telephone Encounter (Signed)
Error

## 2013-05-16 LAB — COMPREHENSIVE METABOLIC PANEL
ALT: 17 U/L (ref 0–53)
AST: 17 U/L (ref 0–37)
Albumin: 4 g/dL (ref 3.5–5.2)
CO2: 25 mEq/L (ref 19–32)
Calcium: 9.4 mg/dL (ref 8.4–10.5)
Chloride: 106 mEq/L (ref 96–112)
Creat: 0.73 mg/dL (ref 0.50–1.35)
Potassium: 3.7 mEq/L (ref 3.5–5.3)
Total Protein: 6.1 g/dL (ref 6.0–8.3)

## 2013-05-16 LAB — VITAMIN D 25 HYDROXY (VIT D DEFICIENCY, FRACTURES): Vit D, 25-Hydroxy: 45 ng/mL (ref 30–89)

## 2013-05-16 LAB — HEMOGLOBIN A1C: Hgb A1c MFr Bld: 6 % — ABNORMAL HIGH (ref <5.7)

## 2013-05-17 ENCOUNTER — Other Ambulatory Visit: Payer: Self-pay

## 2013-05-17 NOTE — Telephone Encounter (Signed)
Dr Katrinka Blazing, pharm requests RFs of sucralfate 1 gm, 1 tab before meals and at bedtime, #60. I see in your office notes that pt is taking Rx as needed but not every day. The sig in EPIC is to take 1 tab as needed. I wasn't sure how you wanted sig to read and what quantity you want to Rx for pt?

## 2013-05-18 ENCOUNTER — Encounter: Payer: Self-pay | Admitting: Internal Medicine

## 2013-05-18 ENCOUNTER — Other Ambulatory Visit: Payer: BC Managed Care – PPO

## 2013-05-18 ENCOUNTER — Ambulatory Visit (INDEPENDENT_AMBULATORY_CARE_PROVIDER_SITE_OTHER): Payer: BC Managed Care – PPO | Admitting: Internal Medicine

## 2013-05-18 VITALS — BP 118/64 | HR 81 | Ht 68.5 in | Wt 165.6 lb

## 2013-05-18 DIAGNOSIS — J302 Other seasonal allergic rhinitis: Secondary | ICD-10-CM

## 2013-05-18 DIAGNOSIS — J309 Allergic rhinitis, unspecified: Secondary | ICD-10-CM

## 2013-05-18 MED ORDER — SUCRALFATE 1 G PO TABS: 1.0000 g | ORAL_TABLET | Freq: Four times a day (QID) | ORAL | Status: DC

## 2013-05-18 NOTE — Telephone Encounter (Signed)
What pharmacy?  Mail order or local?

## 2013-05-18 NOTE — Progress Notes (Signed)
33 yoM smoker followed by Dr.McQuaid for COPD and referred now for allergy evaluation. He gives a history of allergic rhinitis with frequent head congestion and frequent head and chest infections. Remote history of hayfever. His primary physician has recommended daily Claritin and Flonase. He also uses a Neti pot for saline nasal rinse. He describes an episode where he used Therapist, nutritional through a Development worker, international aid with no respiratory protection. He ended up at the hospital in respiratory distress. I believe that product is an oil, meaning he may have had a lipoid pneumonia at that time. Pt reports he has a lot of nasal congestion, PND, cough from drainage, itchy eyes. He denies allergy problems with skin, insect bites or foods. Complicated medical problems include coronary artery disease/stent/MI, hypertension, GERD with esophageal dilatation.  Prior to Admission medications   Medication Sig Start Date End Date Taking? Authorizing Provider  albuterol (VENTOLIN HFA) 108 (90 BASE) MCG/ACT inhaler Inhale 2 puffs into the lungs every 6 (six) hours as needed. For wheezing   Yes Historical Provider, MD  aspirin 81 MG tablet Take 81 mg by mouth daily.     Yes Historical Provider, MD  atorvastatin (LIPITOR) 20 MG tablet Take 1 tablet (20 mg total) by mouth daily. 04/27/13 04/27/14 Yes Antonieta Iba, MD  clopidogrel (PLAVIX) 75 MG tablet Take 1 tablet (75 mg total) by mouth daily. 02/05/13  Yes Antonieta Iba, MD  Dextromethorphan-Guaifenesin (MUCINEX FAST-MAX DM MAX) 5-100 MG/5ML LIQD Take 20 mg by mouth as needed.    Yes Historical Provider, MD  esomeprazole (NEXIUM) 40 MG capsule Take 1 capsule (40 mg total) by mouth daily before breakfast. 04/20/13  Yes [name], MD  fluticasone [facility]) 50 MCG/ACT nasal spray Place 2 sprays into the nose daily.   Yes Historical Provider, MD  Fluticasone-Salmeterol (ADVAIR DISKUS) 500-50 MCG/DOSE AEPB Inhale 1 puff into the lungs every 12 (twelve) hours.     Yes  Historical Provider, MD  Hypertonic Nasal Wash (SINUS RINSE NA) Place 1 Package into the nose as needed.    Yes Historical Provider, MD  levalbuterol Pauline Aus) 0.63 MG/3ML nebulizer solution Take 1 ampule by nebulization every 4 (four) hours as needed.     Yes Historical Provider, MD  loratadine (CLARITIN) 10 MG tablet Take 10 mg by mouth daily.   Yes Historical Provider, MD  LORazepam (ATIVAN) 0.5 MG tablet Take 1 tablet (0.5 mg total) by mouth at bedtime. 03/09/13  Yes [name], MD  losartan (COZAAR) 25 MG tablet Take 1 tablet (25 mg total) by mouth daily. 02/19/13  Yes Iran Ouch, MD  nystatin-triamcinolone ointment (MYCOLOG) Apply topically 3 (three) times daily. 02/14/13  Yes [name], MD  Pseudoephedrine-DM-GG [facility] COLD & COUGH PO) As directed per bottle when needed for cough   Yes Historical Provider, MD  sertraline (ZOLOFT) 50 MG tablet Take 2  tablets daily.   Yes Historical Provider, MD  sucralfate (CARAFATE) 1 G tablet Take 1 tablet (1 g total) by mouth 4 (four) times daily. 05/18/13  Yes [name], MD  tiotropium (SPIRIVA) 18 MCG inhalation capsule Place 18 mcg into inhaler and inhale daily.     Yes Historical Provider, MD  vitamin C (ASCORBIC ACID) 500 MG tablet Take 500 mg by mouth daily.   Yes Historical Provider, MD  nitroGLYCERIN (NITROSTAT) 0.4 MG SL tablet Place 1 tablet (0.4 mg total) under the tongue every 5 (five) minutes as needed. 05/24/13   Iran Ouch, MD  Past Medical History  Diagnosis Date  . Hyperlipidemia   . COPD (chronic obstructive pulmonary disease)   . Depression   . GERD (gastroesophageal reflux disease)   . Colon polyps 08/2012    colonoscopy; multiple colon polyps; repeat colonoscopy in one year.  . Status post dilation of esophageal narrowing 2000  . Peptic ulcer   . Hypertension   . CARDIOMYOPATHY 03/24/2010    did not tolerate Toprol and Lisinopril.   . Insomnia   . Hemorrhoids   . MI (myocardial infarction)   .  Atherosclerosis   . Anxiety   . Headache(784.0)   . Tinea pedis   . Myalgia   . Helicobacter pylori (H. pylori)   . Thyroid dysfunction   . Chest pain   . Back pain   . Hernia     inguinal  . Allergic rhinitis   . Bronchitis     09-14-11  . Chronic airway obstruction, not elsewhere classified   . Coronary artery disease 11/2009    Anterior MI with late presentation 11/2009. Cath showed a 100% proximal LAD occlusion, 99% mid RCA. Had a PCI and 2 DES to both LAD and RCA. EF was 35%. Nuclear stress test 05/13: prior anterior/inferior infarcts without ischemic, EF 43%, cardiac cath in 02/14: Widely patent stents with no other obstructive disease. EF 40%    Past Surgical History  Procedure Laterality Date  . Penile prosthesis implant    . Esophagogastroduodenoscopy  2008  . Colonoscopy    . Esophageal dilation    . Hernia repair  07/20/2012    L inguinal hernia repair  . Admission  12/20/2012    COPD exacerbation.  [facility].  . Esophagogastroduodenoscopy  08/20/2012  . Cardiac catheterization    . Coronary angioplasty with stent placement  09/2009    LAD 3.0 X23 mm Xience DES, RCA: 4.0 X 15 mm Xience DES  . Cardiac catheterization  02/05/13    [facility]   Family History  Problem Relation Age of Onset  . Heart attack Brother     Brother #1  . Diabetes Brother     brother #2  . Hypertension Brother     #3  . Coronary artery disease Father 42    deceased  . COPD Mother 39    deceased  . Heart attack Sister 38    acute-cocaine abuse/deceased  . Heart attack Father   . Diabetes Father    History   Social History  . Marital Status: Married    Spouse Name: N/A    Number of Children: 5  . 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 Smoking status: Current Every Day Smoker -- 1.00 packs/day for 42 years    Types: Cigarettes  . Smokeless tobacco: Never Used  . Alcohol Use: No  . Drug Use: No  . Sexually Active: Yes    Other Topics Concern  . Not on file   Social History Narrative   Marital status: married x 31 years; happily married.      Children: 4 children;  1 stepchild and 15 grandchildren; no great grandchildren.      Lives: with wife; 2 dogs, 1 cat.      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      Tobacco: 1-2 ppd x 45 years      Alcohol: none      Drugs: none  Exercise: no formal exercise; physically demanding job; owns horses.      Seatbelts:  100%      Guns: one loaded unsecured gun in the home.      Sunscreen: none   ROS-see HPI Constitutional:   No-   weight loss, night sweats, fevers, chills, fatigue, lassitude. HEENT:   No-  headaches, difficulty swallowing, tooth/dental problems, sore throat,       No-  sneezing, itching, ear ache, +nasal congestion, post nasal drip,  CV:  No-   chest pain, orthopnea, PND, swelling in lower extremities, anasarca,                                  dizziness, palpitations Resp: + shortness of breath with exertion or at rest.              No-   productive cough,  No non-productive cough,  No- coughing up of blood.              No-   change in color of mucus.  No- wheezing.   Skin: No-   rash or lesions. GI:  +heartburn, indigestion, No-abdominal pain, nausea, vomiting, diarrhea,                 change in bowel habits, loss of appetite GU: No-   dysuria, change in color of urine, no urgency or frequency.  No- flank pain. MS:  No-   joint pain or swelling.  No- decreased range of motion.  No- back pain. Neuro-     nothing unusual Psych:  No- change in mood or affect. +depression or anxiety.  No memory loss.  OBJ- Physical Exam General- Alert, Oriented, Affect-appropriate, Distress- none acute Skin- rash-none, tattoos, suntan Lymphadenopathy- none Head- atraumatic            Eyes- Gross vision intact, PERRLA, conjunctivae and secretions clear            Ears- Hearing, canals-normal            Nose- Clear, no-Septal dev,  mucus, polyps, erosion, perforation             Throat- Mallampati II , mucosa clear , drainage- none, tonsils- atrophic. Missing teeth Neck- flexible , trachea midline, no stridor , thyroid nl, carotid no bruit Chest - symmetrical excursion , unlabored           Heart/CV- RRR , no murmur , no gallop  , no rub, nl s1 s2                           - JVD- none , edema- none, stasis changes- none, varices- none           Lung- +diminished, wheeze-+faint, cough- none , dullness-none, rub- none           Chest wall-  Abd- tender-no, distended-no, bowel sounds-present, HSM- no Br/ Gen/ Rectal- Not done, not indicated Extrem- cyanosis- none, clubbing, none, atrophy- none, strength- nl Neuro- grossly intact to observation

## 2013-05-18 NOTE — Patient Instructions (Addendum)
Ok to continue the [employer] pot rinses and the Flonase nasal spray  Use Claritin as needed for nasal drainage, sneezing  Order- lab   Allergy Profile    Dx rhinitis

## 2013-05-21 LAB — ALLERGY FULL PROFILE
Allergen, D pternoyssinus,d7: 0.19 kU/L — ABNORMAL HIGH
Allergen,Goose feathers, e70: <0.1 kU/L
Alternaria Alternata: 1.66 kU/L — ABNORMAL HIGH
Aspergillus fumigatus, m3: 30 kU/L — ABNORMAL HIGH
Bahia Grass: 0.69 kU/L — ABNORMAL HIGH
Bermuda Grass: 0.46 kU/L — ABNORMAL HIGH
Box Elder IgE: 0.15 kU/L — ABNORMAL HIGH
Candida Albicans: 5.19 kU/L — ABNORMAL HIGH
Cat Dander: 0.22 kU/L — ABNORMAL HIGH
Common Ragweed: 1.65 kU/L — ABNORMAL HIGH
Curvularia lunata: 0.14 kU/L — ABNORMAL HIGH
D. farinae: 0.54 kU/L — ABNORMAL HIGH
Dog Dander: 0.34 kU/L — ABNORMAL HIGH
Elm IgE: 0.16 kU/L — ABNORMAL HIGH
Fescue: 1 kU/L — ABNORMAL HIGH
G005 Rye, Perennial: 0.87 kU/L — ABNORMAL HIGH
G009 Red Top: 1.02 kU/L — ABNORMAL HIGH
Goldenrod: 0.19 kU/L — ABNORMAL HIGH
Helminthosporium halodes: 0.51 kU/L — ABNORMAL HIGH
House Dust Hollister: 0.14 kU/L — ABNORMAL HIGH
IgE (Immunoglobulin E), Serum: 664.8 [IU]/mL — ABNORMAL HIGH (ref 0.0–180.0)
Lamb's Quarters: 0.19 kU/L — ABNORMAL HIGH
Oak: 0.17 kU/L — ABNORMAL HIGH
Plantain: 0.18 kU/L — ABNORMAL HIGH
Stemphylium Botryosum: 0.23 kU/L — ABNORMAL HIGH
Sycamore Tree: 0.13 kU/L — ABNORMAL HIGH
Timothy Grass: 0.65 kU/L — ABNORMAL HIGH

## 2013-05-24 ENCOUNTER — Ambulatory Visit (INDEPENDENT_AMBULATORY_CARE_PROVIDER_SITE_OTHER): Payer: BC Managed Care – PPO | Admitting: Cardiovascular Disease

## 2013-05-24 ENCOUNTER — Encounter: Payer: Self-pay | Admitting: Cardiovascular Disease

## 2013-05-24 VITALS — BP 120/78 | HR 75 | Ht 70.0 in | Wt 167.0 lb

## 2013-05-24 DIAGNOSIS — R079 Chest pain, unspecified: Secondary | ICD-10-CM

## 2013-05-24 DIAGNOSIS — I2510 Atherosclerotic heart disease of native coronary artery without angina pectoris: Secondary | ICD-10-CM

## 2013-05-24 DIAGNOSIS — I428 Other cardiomyopathies: Secondary | ICD-10-CM

## 2013-05-24 DIAGNOSIS — I10 Essential (primary) hypertension: Secondary | ICD-10-CM

## 2013-05-24 DIAGNOSIS — R0789 Other chest pain: Secondary | ICD-10-CM

## 2013-05-24 DIAGNOSIS — R0602 Shortness of breath: Secondary | ICD-10-CM

## 2013-05-24 DIAGNOSIS — E785 Hyperlipidemia, unspecified: Secondary | ICD-10-CM

## 2013-05-24 MED ORDER — NITROGLYCERIN 0.4 MG SL SUBL: 0.4000 mg | SUBLINGUAL_TABLET | SUBLINGUAL | Status: DC | PRN

## 2013-05-24 NOTE — Assessment & Plan Note (Signed)
Most recent ejection fraction was 40%. He did not tolerate treatment with a beta blocker due to worsening dyspnea. He is tolerating small dose losartan. He appears to be euvolemic.

## 2013-05-24 NOTE — Progress Notes (Signed)
Primary care physician: Dr. Nilda Simmer  HPI  This is a 62 year old male who is here today for a followup visit. He has known history of coronary artery disease status post anterior myocardial infarction in December of 2012. His LAD was occluded and RCA had a 99% stenosis. He had an angioplasty and drug-eluting stent placement to both LAD and RCA at that time. 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 He cut down to 10 cigarettes a day.  He had worsening exertional dyspnea and chest pain early this year. Thus, I decided to proceed with cardiac catheterization in 02/14  which showed widely patent stents in the LAD and RCA with no evidence of other obstructive disease. Ejection fraction was 40% with mildly elevated left ventricular end-diastolic pressure. Unfortunately, he continues to smoke. He was started on Losartan 25 mg once daily during last visit. He is tolerating this. He denies chest pain.   Allergies  Allergen Reactions  . Wellbutrin (Bupropion) Other (See Comments)    Unknown/"made me feel real funny"     Current Outpatient Prescriptions on File Prior to Visit  Medication Sig Dispense Refill  . albuterol (VENTOLIN HFA) 108 (90 BASE) MCG/ACT inhaler Inhale 2 puffs into the lungs every 6 (six) hours as needed. For wheezing      . aspirin 81 MG tablet Take 81 mg by mouth daily.        [name] atorvastatin (LIPITOR) 20 MG tablet Take 1 tablet (20 mg total) by mouth daily.  90 tablet  3  . clopidogrel (PLAVIX) 75 MG tablet Take 1 tablet (75 mg total) by mouth daily.  90 tablet  3  . Dextromethorphan-Guaifenesin (MUCINEX FAST-MAX DM MAX) 5-100 MG/5ML LIQD Take 20 mg by mouth as needed.       [name] esomeprazole (NEXIUM) 40 MG capsule Take 1 capsule (40 mg total) by mouth daily before breakfast.  90 capsule  3  . fluticasone (FLONASE) 50 MCG/ACT nasal spray Place 2 sprays into the nose daily.      . Fluticasone-Salmeterol (ADVAIR DISKUS) 500-50 MCG/DOSE AEPB Inhale 1 puff into the  lungs every 12 (twelve) hours.        . Hypertonic Nasal Wash (SINUS RINSE NA) Place 1 Package into the nose as needed.       . levalbuterol (XOPENEX) 0.63 MG/3ML nebulizer solution Take 1 ampule by nebulization every 4 (four) hours as needed.        . loratadine (CLARITIN) 10 MG tablet Take 10 mg by mouth daily.      [name] LORazepam (ATIVAN) 0.5 MG tablet Take 1 tablet (0.5 mg total) by mouth at bedtime.  30 tablet  5  . losartan (COZAAR) 25 MG tablet Take 1 tablet (25 mg total) by mouth daily.  30 tablet  6  . nitroGLYCERIN (NITROSTAT) 0.4 MG SL tablet Place 1 tablet (0.4 mg total) under the tongue every 5 (five) minutes as needed.  25 tablet  3  . nystatin-triamcinolone ointment (MYCOLOG) Apply topically 3 (three) times daily.  60 g  0  . Pseudoephedrine-DM-GG (ROBITUSSIN COLD & COUGH PO) As directed per bottle when needed for cough      . sertraline (ZOLOFT) 50 MG tablet Take 2  tablets daily.      . sucralfate (CARAFATE) 1 G tablet Take 1 tablet (1 g total) by mouth 4 (four) times daily.  360 tablet  1  . tiotropium (SPIRIVA) 18 MCG inhalation capsule Place 18 mcg into inhaler and inhale  daily.        . vitamin C (ASCORBIC ACID) 500 MG tablet Take 500 mg by mouth daily.       No current facility-administered medications on file prior to visit.     Past Medical History  Diagnosis Date  . Hyperlipidemia   . COPD (chronic obstructive pulmonary disease)   . Depression   . GERD (gastroesophageal reflux disease)   . Colon polyps 08/2012    colonoscopy; multiple colon polyps; repeat colonoscopy in one year.  . Status post dilation of esophageal narrowing 2000  . Peptic ulcer   . Hypertension   . CARDIOMYOPATHY 03/24/2010    did not tolerate Toprol and Lisinopril.   . Insomnia   . Hemorrhoids   . MI (myocardial infarction)   . Atherosclerosis   . Anxiety   . Headache(784.0)   . Tinea pedis   . Myalgia   . Helicobacter pylori (H. pylori)   . Thyroid dysfunction   . Chest pain   . Back  pain   . Hernia     inguinal  . Allergic rhinitis   . Bronchitis     09-14-11  . Chronic airway obstruction, not elsewhere classified   . Coronary artery disease 11/2009    Anterior MI with late presentation 11/2009. Cath showed a 100% proximal LAD occlusion, 99% mid RCA. Had a PCI and 2 DES to both LAD and RCA. EF was 35%. Nuclear stress test 05/13: prior anterior/inferior infarcts without ischemic, EF 43%, cardiac cath in 02/14: Widely patent stents with no other obstructive disease. EF 40%      Past Surgical History  Procedure Laterality Date  . Penile prosthesis implant    . Esophagogastroduodenoscopy  2008  . Colonoscopy    . Esophageal dilation    . Hernia repair  07/20/2012    L inguinal hernia repair  . Admission  12/20/2012    COPD exacerbation.  [facility].  . Esophagogastroduodenoscopy  08/20/2012  . Cardiac catheterization    . Coronary angioplasty with stent placement  09/2009    LAD 3.0 X23 mm Xience DES, RCA: 4.0 X 15 mm Xience DES  . Cardiac catheterization  02/05/13    [facility]     Family History  Problem Relation Age of Onset  . Heart attack Brother     Brother #1  . Diabetes Brother     brother #2  . Hypertension Brother     #3  . Coronary artery disease Father 90    deceased  . COPD Mother 58    deceased  . Heart attack Sister 57    acute-cocaine abuse/deceased  . Heart attack Father   . Diabetes Father      History   Social History  . Marital Status: Married    Spouse Name: N/A    Number of Children: 5  . 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 Smoking status: Current Every Day Smoker -- 1.00 packs/day for 42 years    Types: Cigarettes  . Smokeless tobacco: Never Used  . Alcohol Use: No  . Drug Use: No  . Sexually Active: Yes   Other Topics Concern  . Not on file   Social History Narrative   Marital status: married x 31 years; happily married.      Children: 4 children;   1 stepchild and 15 grandchildren; no great grandchildren.      Lives: with wife;  2 dogs, 1 cat.      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      Tobacco: 1-2 ppd x 45 years      Alcohol: none      Drugs: none      Exercise: no formal exercise; physically demanding job; owns horses.      Seatbelts:  100%      Guns: one loaded unsecured gun in the home.      Sunscreen: none      PHYSICAL EXAM   BP 120/78  Pulse 75  Ht 5\' 10"  (1.778 m)  Wt 167 lb (75.751 kg)  BMI 23.96 kg/m2 Constitutional: He is oriented to person, place, and time. He appears well-developed and well-nourished. No distress.  HENT: No nasal discharge.  Head: Normocephalic and atraumatic.  Eyes: Pupils are equal and round. Right eye exhibits no discharge. Left eye exhibits no discharge.  Neck: Normal range of motion. Neck supple. No JVD present. No thyromegaly present.  Cardiovascular: Normal rate, regular rhythm, normal heart sounds and. Exam reveals no gallop and no friction rub. No murmur heard.  Pulmonary/Chest: Effort normal and diminished breath sounds. No stridor. No respiratory distress. He has no wheezes. He has no rales. He exhibits no tenderness.  Abdominal: Soft. Bowel sounds are normal. He exhibits no distension. There is no tenderness. There is no rebound and no guarding.  Musculoskeletal: Normal range of motion. He exhibits no edema and no tenderness.  Neurological: He is alert and oriented to person, place, and time. Coordination normal.  Skin: Skin is warm and dry. No rash noted. He is not diaphoretic. No erythema. No pallor.  Psychiatric: He has a normal mood and affect. His behavior is normal. Judgment and thought content normal.  Right radial pulse is normal with no hematoma.     EKG: Sinus  Rhythm  Incomplete right bundle branch block  -Old anterior infarct.   ABNORMAL    ASSESSMENT AND PLAN

## 2013-05-24 NOTE — Assessment & Plan Note (Signed)
Lab Results  Component Value Date   CHOL 142 02/14/2013   HDL 47 02/14/2013   LDLCALC 80 02/14/2013   TRIG 75 02/14/2013   CHOLHDL 3.0 02/14/2013   Continue treatment with atorvastatin.

## 2013-05-24 NOTE — Assessment & Plan Note (Signed)
He is doing well at this point with no recurrent angina. Continue medical therapy.

## 2013-05-24 NOTE — Patient Instructions (Addendum)
Continue same medications.  Follow up in 6 months.  

## 2013-05-24 NOTE — Assessment & Plan Note (Signed)
Blood pressure is well controlled 

## 2013-05-28 NOTE — Progress Notes (Signed)
Quick Note:  Pt aware of results. ______ 

## 2013-05-29 DIAGNOSIS — J3089 Other allergic rhinitis: Secondary | ICD-10-CM | POA: Insufficient documentation

## 2013-05-29 DIAGNOSIS — J302 Other seasonal allergic rhinitis: Secondary | ICD-10-CM | POA: Insufficient documentation

## 2013-05-29 NOTE — Assessment & Plan Note (Signed)
With his smoking history, it remains to be seen if his rhinitis or bronchitis symptoms are from an allergy component, beyond what would be explained by tobacco use Plan-environmental precautions and smoking cessation were discussed. Lab for allergy profile. Consider skin testing later if needed.

## 2013-05-31 ENCOUNTER — Telehealth: Payer: Self-pay | Admitting: Pulmonary Disease

## 2013-05-31 MED ORDER — AMOXICILLIN-POT CLAVULANATE 875-125 MG PO TABS: 1.0000 | ORAL_TABLET | Freq: Two times a day (BID) | ORAL | Status: DC

## 2013-05-31 NOTE — Telephone Encounter (Signed)
Per CY-okay to give Augmentin 875 mg #14 take 1 po BID no refills.  

## 2013-05-31 NOTE — Telephone Encounter (Signed)
Spoke to pt. Reports having chest congestion, coughing with production of green mucus, body aches and SOB. Denies fever/chills, chest tightness. Has tried Mucinex with no relief. Wants CY recommendations.  Last OV 05/18/13 Pending OV 07/02/13 Pharmacy: [facility]  Allergies  Allergen Reactions  . Wellbutrin (Bupropion) Other (See Comments)    Unknown/"made me feel real funny"    CY - please advise. Thanks.

## 2013-05-31 NOTE — Telephone Encounter (Signed)
Pt aware of recs. rx called in 

## 2013-06-01 ENCOUNTER — Ambulatory Visit: Payer: BC Managed Care – PPO

## 2013-06-01 ENCOUNTER — Ambulatory Visit (INDEPENDENT_AMBULATORY_CARE_PROVIDER_SITE_OTHER): Payer: BC Managed Care – PPO | Admitting: Family Medicine

## 2013-06-01 ENCOUNTER — Telehealth: Payer: Self-pay | Admitting: *Deleted

## 2013-06-01 VITALS — BP 122/64 | HR 74 | Temp 97.9°F | Resp 18 | Ht 69.0 in | Wt 167.0 lb

## 2013-06-01 DIAGNOSIS — R059 Cough, unspecified: Secondary | ICD-10-CM

## 2013-06-01 DIAGNOSIS — J441 Chronic obstructive pulmonary disease with (acute) exacerbation: Secondary | ICD-10-CM

## 2013-06-01 DIAGNOSIS — R7309 Other abnormal glucose: Secondary | ICD-10-CM

## 2013-06-01 DIAGNOSIS — R05 Cough: Secondary | ICD-10-CM

## 2013-06-01 LAB — POCT CBC
Granulocyte percent: 68.7 %G (ref 37–80)
HCT, POC: 46.3 % (ref 43.5–53.7)
Hemoglobin: 14.4 g/dL (ref 14.1–18.1)
POC Granulocyte: 4.9 (ref 2–6.9)
RDW, POC: 14.1 %

## 2013-06-01 IMAGING — CR DG CHEST 2V
3 series · 3 of 3 positions shown · non-contrast
Comparison: [DATE].

CLINICAL DATA: History of shortness of breath, wheezing, and
coughing.

CHEST - 2 VIEW

[PA (1 of 2)]
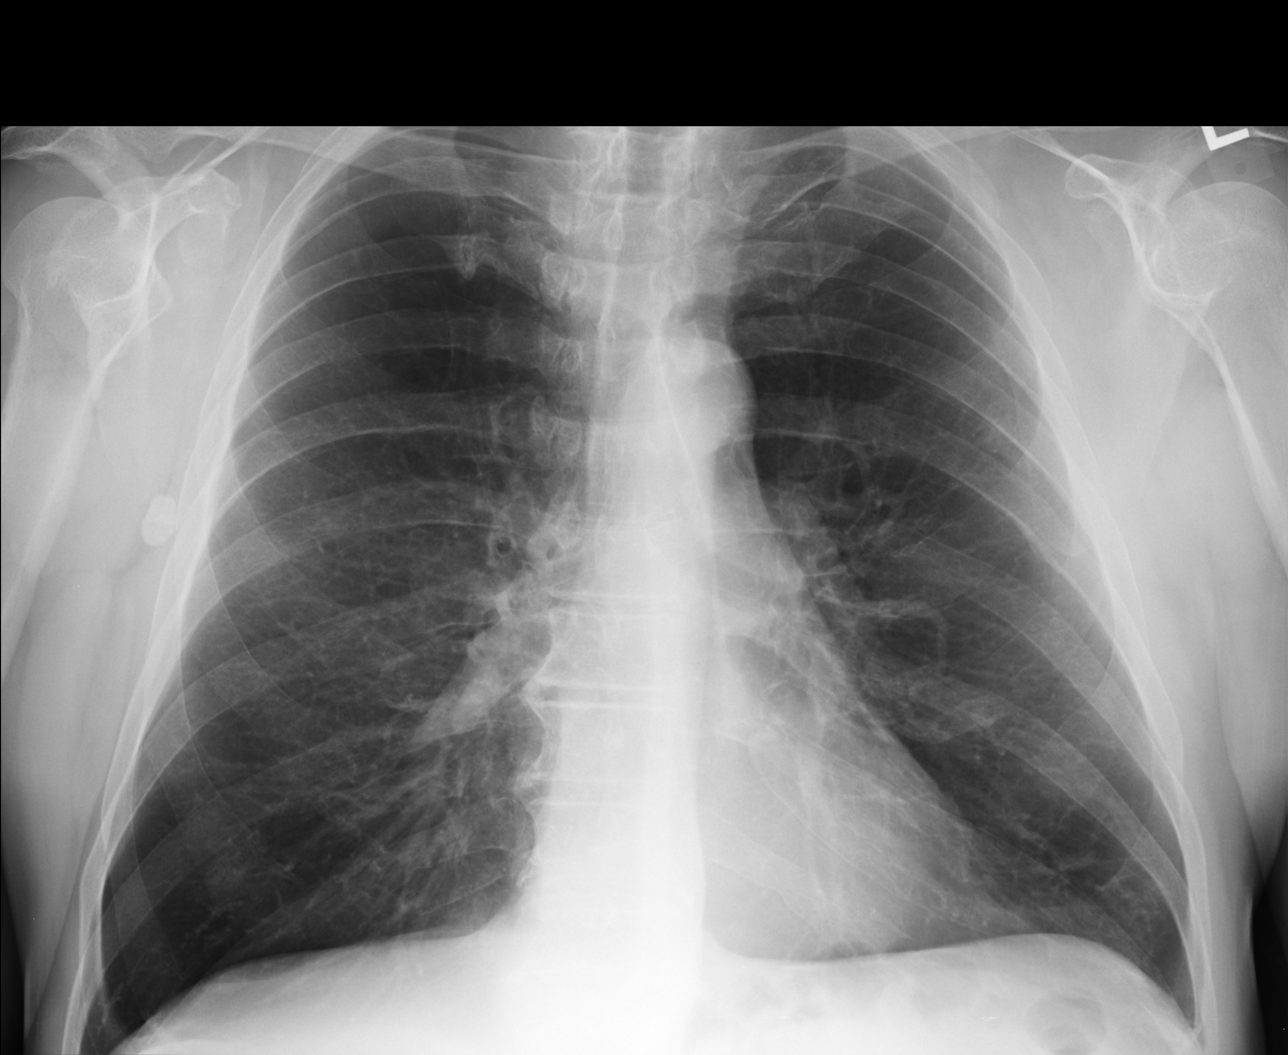

[lateral]
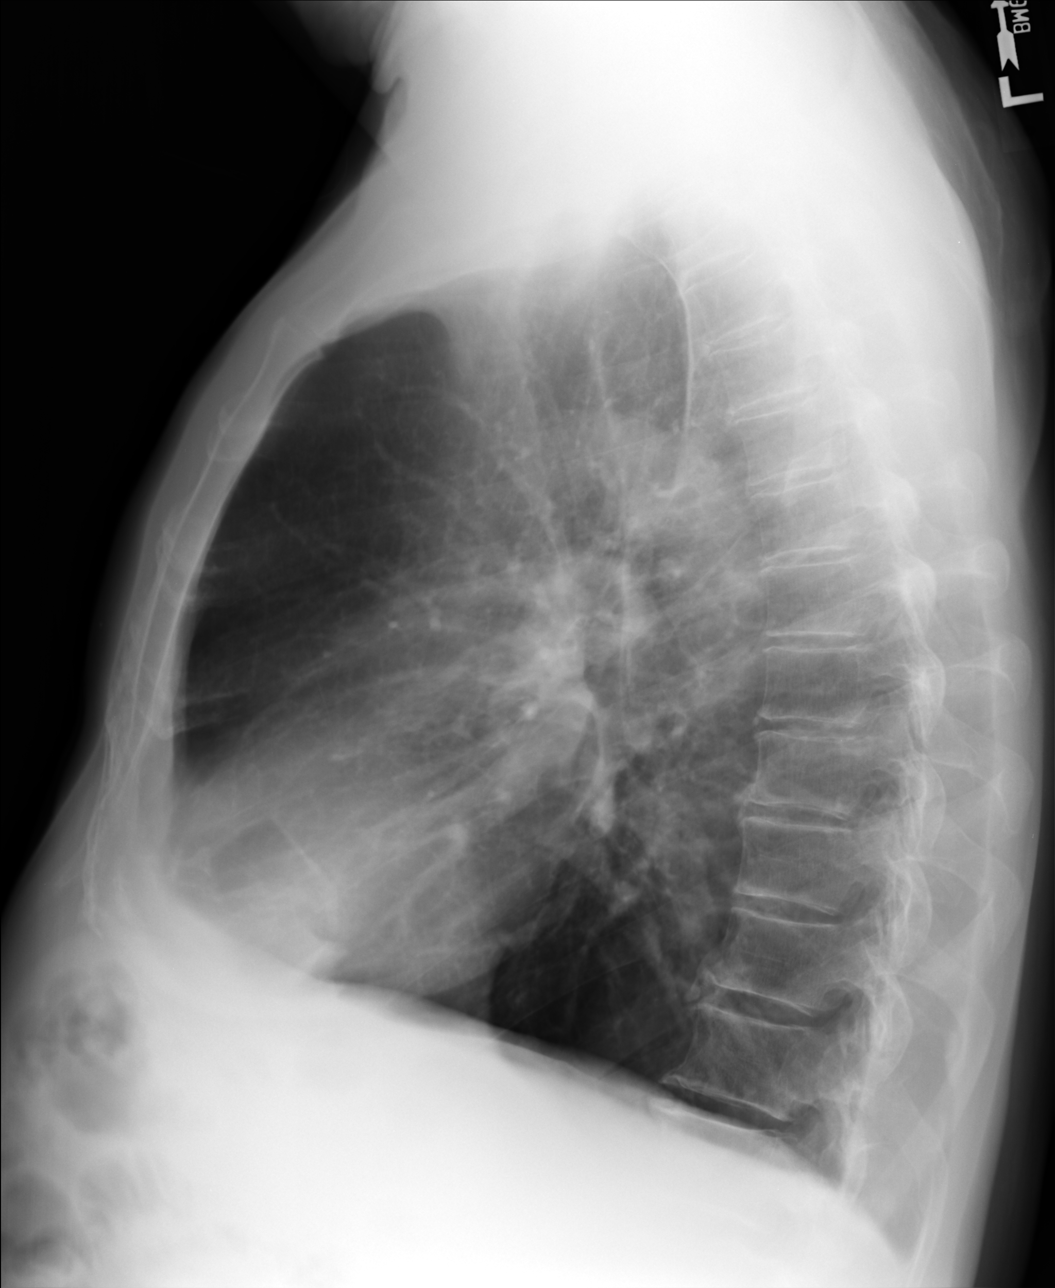

[PA (2 of 2)]
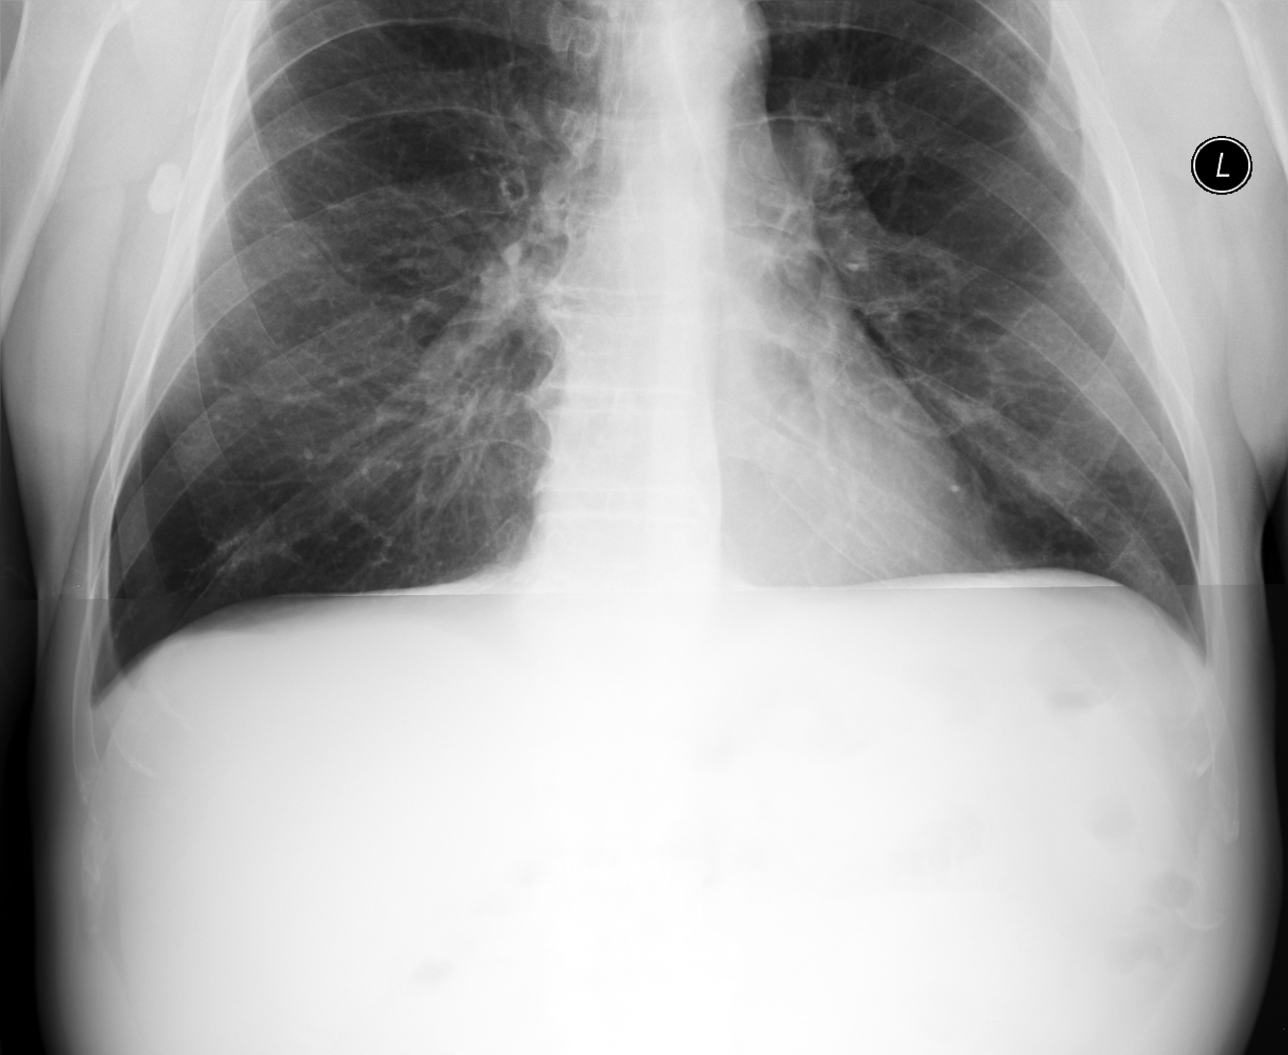

[3 of 3 positions shown; findings below may reference images not displayed]

FINDINGS: Cardiac silhouette is normal size and shape.  It appears
stable.  Mediastinal and hilar contours appear stable.  There is
generalized hyperinflation configuration consistent with
obstructive pulmonary disease with flattening of the diaphragm on
lateral image.  No pulmonary infiltrates, consolidation, or pleural
effusions are evident. There is minimal central peribronchial
thickening.  There is narrowing of some of the intervertebral disc
spaces.  Multilevel marginal osteophyte formation of degenerative
spondylosis is present.
IMPRESSION: Generalized hyperinflation configuration with with obstructive
pulmonary disease.  No consolidation or pleural effusion. Minimal
central peribronchial thickening which may be associated with
asthma, bronchitis, and reactive airway disease.

Clinically significant discrepancy from primary report, if
provided: None

## 2013-06-01 MED ORDER — ALBUTEROL SULFATE (2.5 MG/3ML) 0.083% IN NEBU
2.5000 mg | INHALATION_SOLUTION | Freq: Once | RESPIRATORY_TRACT | Status: AC
  Administered 2013-06-01: 2.5 mg via RESPIRATORY_TRACT

## 2013-06-01 MED ORDER — DOXYCYCLINE HYCLATE 100 MG PO CAPS: 100.0000 mg | ORAL_CAPSULE | Freq: Two times a day (BID) | ORAL | Status: DC

## 2013-06-01 MED ORDER — PREDNISONE 20 MG PO TABS: ORAL_TABLET | ORAL | Status: DC

## 2013-06-01 NOTE — Telephone Encounter (Signed)
Pt presented to office, complaining of shortness of breath, started on Augmentin yesterday for symptoms of cough and congestion. Pt states no improvement since starting antibiotic, concerned he is having a reaction to the Augmentin. Pt denies any itching, throat swelling. Pt states has been wheezing at home, using nebulizer treatment as directed. Pt complaining of mild, productive cough today. Denies chest pain, body aches, fever. B/P 130/62, HR 70, O2 sat 95%, Temp 98.0. Pt states slightly anxious, drove himself to the office. Pt has not contacted his PCP or pulmonologist's office today. Offered to call his PCP to see if they have available appointment or can go to Urgent Care, advised pt of importance of being evaluated today. Offered to call him a ride if nervous to drive. Pt declined, stated he would call on his own. Pt returned to state that he had spoken with Dr. Michaelle Copas office and has an appointment at 3:00, but that they advised him to go sooner if symptoms worsened. Pt also stated his daughter was on her way to drive him to the appointment.

## 2013-06-01 NOTE — Progress Notes (Signed)
[address]   [place], [place]  [address]   [phone]  Subjective:    Patient ID: Leslie Sims, male    DOB: 15-Jul-1951, 62 y.o.   MRN: [id number]  HPI This 62 y.o. male presents for evaluation of shortness of breath.  Onset one week ago.  Cut grass with mask one week ago; crawling under trailer; did not wear mask; had fiberglass productions in face; breathing got worse.  Called Dr. Ulyses Jarred office but not in; called Dr. Roxy Cedar office; not in the office; pulse oximetry 95%.  Called in Augmentin yesterday.  No fever/chills but +mild sweats.  Some headache.  No ear pain; no sore throat but scratchy.  No rhinorrhea; no nasal congestion.  Flushed with sinus rinse without drainage.  Coughing up phlegm which is gummy and greenish; small amount of sputum.  Wheezing a lot; using rescue inhaler four times per day.  Using nebulizer twice daily without improvement.  Using Advair and Spiriva every day.  No myalgias.  Started Augmentin yesterday with acute worsening of SOB.  Breathing gets worse after taking Augmentin.  Breathing will get worse 30 minutes after Augmentin dose.  Will gasp for air for a while.  Cannot walk far before running out of breath.    Review of Systems  Constitutional: Positive for diaphoresis. Negative for fever, chills and fatigue.  HENT: Negative for ear pain, congestion, rhinorrhea, sneezing and postnasal drip.   Respiratory: Positive for cough, shortness of breath and wheezing.   Cardiovascular: Negative for chest pain, palpitations and leg swelling.  Gastrointestinal: Negative for nausea, vomiting, abdominal pain and diarrhea.  Skin: Negative for rash.   Past Medical History  Diagnosis Date  . Hyperlipidemia   . COPD (chronic obstructive pulmonary disease)   . Depression   . GERD (gastroesophageal reflux disease)   . Colon polyps 08/2012    colonoscopy; multiple colon polyps; repeat colonoscopy in one year.  . Status post dilation of esophageal narrowing 2000  .  Peptic ulcer   . Hypertension   . CARDIOMYOPATHY 03/24/2010    did not tolerate Toprol and Lisinopril.   . Insomnia   . Hemorrhoids   . MI (myocardial infarction)   . Atherosclerosis   . Anxiety   . Headache(784.0)   . Tinea pedis   . Myalgia   . Helicobacter pylori (H. pylori)   . Thyroid dysfunction   . Chest pain   . Back pain   . Hernia     inguinal  . Allergic rhinitis   . Bronchitis     09-14-11  . Chronic airway obstruction, not elsewhere classified   . Coronary artery disease 11/2009    Anterior MI with late presentation 11/2009. Cath showed a 100% proximal LAD occlusion, 99% mid RCA. Had a PCI and 2 DES to both LAD and RCA. EF was 35%. Nuclear stress test 05/13: prior anterior/inferior infarcts without ischemic, EF 43%, cardiac cath in 02/14: Widely patent stents with no other obstructive disease. EF 40%    Current Outpatient Prescriptions on File Prior to Visit  Medication Sig Dispense Refill  . albuterol (VENTOLIN HFA) 108 (90 BASE) MCG/ACT inhaler Inhale 2 puffs into the lungs every 6 (six) hours as needed. For wheezing      . amoxicillin-clavulanate (AUGMENTIN) 875-125 MG per tablet Take 1 tablet by mouth 2 (two) times daily.  14 tablet  0  . aspirin 81 MG tablet Take 81 mg by mouth daily.        [name]  atorvastatin (LIPITOR) 20 MG tablet Take 1 tablet (20 mg total) by mouth daily.  90 tablet  3  . clopidogrel (PLAVIX) 75 MG tablet Take 1 tablet (75 mg total) by mouth daily.  90 tablet  3  . Dextromethorphan-Guaifenesin (MUCINEX FAST-MAX DM MAX) 5-100 MG/5ML LIQD Take 20 mg by mouth as needed.       [name] esomeprazole (NEXIUM) 40 MG capsule Take 1 capsule (40 mg total) by mouth daily before breakfast.  90 capsule  3  . fluticasone (FLONASE) 50 MCG/ACT nasal spray Place 2 sprays into the nose daily.      . Fluticasone-Salmeterol (ADVAIR DISKUS) 500-50 MCG/DOSE AEPB Inhale 1 puff into the lungs every 12 (twelve) hours.        . Hypertonic Nasal Wash (SINUS RINSE NA) Place 1 Package  into the nose as needed.       . levalbuterol (XOPENEX) 0.63 MG/3ML nebulizer solution Take 1 ampule by nebulization every 4 (four) hours as needed.        . loratadine (CLARITIN) 10 MG tablet Take 10 mg by mouth daily.      [name] LORazepam (ATIVAN) 0.5 MG tablet Take 1 tablet (0.5 mg total) by mouth at bedtime.  30 tablet  5  . losartan (COZAAR) 25 MG tablet Take 1 tablet (25 mg total) by mouth daily.  30 tablet  6  . nitroGLYCERIN (NITROSTAT) 0.4 MG SL tablet Place 1 tablet (0.4 mg total) under the tongue every 5 (five) minutes as needed.  25 tablet  3  . nystatin-triamcinolone ointment (MYCOLOG) Apply topically 3 (three) times daily.  60 g  0  . Pseudoephedrine-DM-GG (ROBITUSSIN COLD & COUGH PO) As directed per bottle when needed for cough      . sertraline (ZOLOFT) 50 MG tablet Take 2  tablets daily.      . sucralfate (CARAFATE) 1 G tablet Take 1 tablet (1 g total) by mouth 4 (four) times daily.  360 tablet  1  . tiotropium (SPIRIVA) 18 MCG inhalation capsule Place 18 mcg into inhaler and inhale daily.        . vitamin C (ASCORBIC ACID) 500 MG tablet Take 500 mg by mouth daily.       No current facility-administered medications on file prior to visit.       Objective:   Physical Exam  Nursing note and vitals reviewed. Constitutional: He is oriented to person, place, and time. He appears well-developed and well-nourished. No distress.  HENT:  Head: Normocephalic and atraumatic.  Right Ear: External ear normal.  Left Ear: External ear normal.  Nose: Nose normal.  Mouth/Throat: No oropharyngeal exudate.  Eyes: Conjunctivae and EOM are normal. Pupils are equal, round, and reactive to light.  Neck: Normal range of motion. Neck supple. No thyromegaly present.  Cardiovascular: Normal rate, regular rhythm and normal heart sounds.  Exam reveals no gallop and no friction rub.   No murmur heard. Pulmonary/Chest: Effort normal. No respiratory distress. He has wheezes.  Moderate air movement;  scant wheezing B lung fields throughout; speaking complete sentences.  Lymphadenopathy:    He has no cervical adenopathy.  Neurological: He is alert and oriented to person, place, and time.  Skin: Skin is warm and dry. No rash noted. He is not diaphoretic.  Psychiatric: He has a normal mood and affect. His behavior is normal.   Results for orders placed in visit on 06/01/13  POCT CBC      Result Value Range   WBC 7.1  4.6 - 10.2 K/uL   Lymph, poc 1.6  0.6 - 3.4   POC LYMPH PERCENT 22.6  10 - 50 %L   MID (cbc) 0.6  0 - 0.9   POC MID % 8.7  0 - 12 %M   POC Granulocyte 4.9  2 - 6.9   Granulocyte percent 68.7  37 - 80 %G   RBC 5.04  4.69 - 6.13 M/uL   Hemoglobin 14.4  14.1 - 18.1 g/dL   HCT, POC 40.9  81.1 - 53.7 %   MCV 91.8  80 - 97 fL   MCH, POC 28.6  27 - 31.2 pg   MCHC 31.1 (*) 31.8 - 35.4 g/dL   RDW, POC 91.4     Platelet Count, POC 240  142 - 424 K/uL   MPV 8.0  0 - 99.8 fL  GLUCOSE, POCT (MANUAL RESULT ENTRY)      Result Value Range   POC Glucose 90  70 - 99 mg/dl   [facility] reading (PRIMARY) by  Dr. Katrinka Blazing.  CXR:  NAD  ALBUTEROL NEBULIZER 5.0MG  ADMINISTERED IN OFFICE.  PULSE OXIMETRY POST-NEB 97%.    Assessment & Plan:  COPD exacerbation - Plan: DG Chest 2 View, predniSONE (DELTASONE) 20 MG tablet, doxycycline (VIBRAMYCIN) 100 MG capsule, albuterol (PROVENTIL) (2.5 MG/3ML) 0.083% nebulizer solution 2.5 mg, albuterol (PROVENTIL) (2.5 MG/3ML) 0.083% nebulizer solution 2.5 mg  Cough - Plan: POCT CBC  Other abnormal glucose - Plan: POCT glucose (manual entry)   1. COPD exacerbation:  New.  Secondary to dust and chemical exposure.  S/p Albuterol nebulizer in office.  Rx for Prednisone provided.  RTC immediately for acute worsening. 2.  Cough: New.  Associated with COPD exacerbation. Stop Augmentin; rx for Doxycycyline provided. Meds ordered this encounter  Medications  . predniSONE (DELTASONE) 20 MG tablet    Sig: 2 tablets daily x 5 days then 1 tablet daily x 5 days     Dispense:  15 tablet    Refill:  0  . doxycycline (VIBRAMYCIN) 100 MG capsule    Sig: Take 1 capsule (100 mg total) by mouth 2 (two) times daily.    Dispense:  20 capsule    Refill:  0  . albuterol (PROVENTIL) (2.5 MG/3ML) 0.083% nebulizer solution 2.5 mg    Sig:   . albuterol (PROVENTIL) (2.5 MG/3ML) 0.083% nebulizer solution 2.5 mg    Sig:

## 2013-06-12 ENCOUNTER — Ambulatory Visit: Payer: Self-pay | Admitting: Family Medicine

## 2013-06-25 ENCOUNTER — Telehealth: Payer: Self-pay | Admitting: Radiology

## 2013-06-25 ENCOUNTER — Encounter: Payer: Self-pay | Admitting: Pulmonary Disease

## 2013-06-25 DIAGNOSIS — R7981 Abnormal blood-gas level: Secondary | ICD-10-CM

## 2013-06-25 DIAGNOSIS — G478 Other sleep disorders: Secondary | ICD-10-CM

## 2013-06-25 NOTE — Telephone Encounter (Signed)
1.  I am not sure if Dr. Fannie Knee is appropriate to evaluate upper airway resistance.  Dr. Maple Hudson is a pulmonologist who specializes in allergy also. I will send a message to Dr. Maple Hudson asking if he could evaluate upper airway resistance. 2. I signed order for nighttime oxygen supplementation; I did not print out order and sign because I am out of town; please have another provider sign in my absence.  3. Lastly, would patient like to start medication for restless leg syndrome?

## 2013-06-25 NOTE — Telephone Encounter (Signed)
Patient does not have sleep apnea. He does have upper airway resistance, needs ENT eval also has low O2 levels at night he should have night time O2 if he agres. He also has restless leg syndrome and should discuss this at follow up with Dr Katrinka Blazing, I have called patient to advise. Left message for him to see if he has ENT or is he wanting referral, also is he agreeable to O2 @ night.

## 2013-06-25 NOTE — Telephone Encounter (Signed)
He called back, yes he has seen an ENT Dr Synthia Innocent, he does not want to go back to him, he does have a Dr he has been seeing for his sinuses. He is seeing a pulmonologist Dr Fannie Knee. Report faxed to Dr Maple Hudson. Also yes, he does want the night time oxygen. I pended this order. Report of the study faxed to Dr Maple Hudson. He wants to know if Dr young can take care of him or does he have to see a pulmonologist. Study in my box to send with the O2 orders, please indicate in order how many liters you need then print and sign and I can fax.

## 2013-06-27 NOTE — Telephone Encounter (Signed)
Continuation, your written note indicated will discuss at office visit. What do you want to offer for him? I will call and offer, what do you want to give him? Order for home o2 signed by Herbert Seta and faxed to [facility]

## 2013-06-27 NOTE — Telephone Encounter (Signed)
Your note indicated you wanted to discuss at office visit. So I did not ask,

## 2013-06-27 NOTE — Telephone Encounter (Signed)
Would offer Requip 0.25mg  1-2 tablets qhs #60 3 refills.

## 2013-06-28 NOTE — Telephone Encounter (Signed)
Noted. Sent email to Dr. Maple Hudson on 06/27/13; awaiting his response.

## 2013-06-28 NOTE — Telephone Encounter (Signed)
Thanks I called pt he states he is fine with his legs as long as he sleeps with a pillow b/t his legs, he does not want to start any medication for the restless legs at this time. I told him we will let him know if Dr Maple Hudson can take care of his upper airway resistance.

## 2013-07-02 ENCOUNTER — Encounter: Payer: Self-pay | Admitting: Internal Medicine

## 2013-07-02 ENCOUNTER — Ambulatory Visit (INDEPENDENT_AMBULATORY_CARE_PROVIDER_SITE_OTHER): Payer: BC Managed Care – PPO | Admitting: Internal Medicine

## 2013-07-02 VITALS — BP 122/70 | HR 93 | Ht 69.0 in | Wt 168.4 lb

## 2013-07-02 DIAGNOSIS — J449 Chronic obstructive pulmonary disease, unspecified: Secondary | ICD-10-CM

## 2013-07-02 DIAGNOSIS — J309 Allergic rhinitis, unspecified: Secondary | ICD-10-CM

## 2013-07-02 DIAGNOSIS — J302 Other seasonal allergic rhinitis: Secondary | ICD-10-CM

## 2013-07-02 DIAGNOSIS — G479 Sleep disorder, unspecified: Secondary | ICD-10-CM

## 2013-07-02 DIAGNOSIS — F17200 Nicotine dependence, unspecified, uncomplicated: Secondary | ICD-10-CM

## 2013-07-02 MED ORDER — PROTRIPTYLINE HCL 5 MG PO TABS: 5.0000 mg | ORAL_TABLET | Freq: Every day | ORAL | Status: DC

## 2013-07-02 NOTE — Progress Notes (Signed)
34 yoM smoker followed by Dr.McQuaid for COPD and referred now for allergy evaluation. He gives a history of allergic rhinitis with frequent head congestion and frequent head and chest infections. Remote history of hayfever. His primary physician has recommended daily Claritin and Flonase. He also uses a Neti pot for saline nasal rinse. He describes an episode where he used Therapist, nutritional through a Development worker, international aid with no respiratory protection. He ended up at the hospital in respiratory distress. I believe that product is an oil, meaning he may have had a lipoid pneumonia at that time. Pt reports he has a lot of nasal congestion, PND, cough from drainage, itchy eyes. He denies allergy problems with skin, insect bites or foods. Complicated medical problems include coronary artery disease/stent/MI, hypertension, GERD with esophageal dilatation.  07/02/13- 61 yoM smoker followed by Dr.McQuaid for COPD and referred  for allergy evaluation/ allergic rhinitis FOLLOWS FOR: pt reports still having congestion in throat and chest,constantly having prod cough w light green mucus-- using Neti Pot w good results Dr Nilda Simmer had noted Upper Airway Resistance Syndrome and Restless Legs. Sleep study in [place] 3 weeks ago. We are seeking report for this file. He complains of persistent fatigue "no energy", taking Ativan and sertraline x2 at bedtime. Does not sleep on his back. No effort to stop smoking. Blames nose and chest congestion" weather", continuing Flonase. Allergy Profile 05/18/2013-total IgE 664.8 with elevated specific antigens for almost everything on the panel. Wears a mask to mow. O2 2l/ Sleep  Note especially high IgE level for Aspergillus.  ROS-see HPI Constitutional:   No-   weight loss, night sweats, fevers, chills, +fatigue, lassitude. HEENT:   No-  headaches, difficulty swallowing, tooth/dental problems, sore throat,       No-  sneezing, itching, ear ache, +nasal congestion, post nasal  drip,  CV:  No-   chest pain, orthopnea, PND, swelling in lower extremities, anasarca, dizziness, palpitations Resp: + shortness of breath with exertion or at rest.              No-   productive cough,  No non-productive cough,  No- coughing up of blood.              No-   change in color of mucus.  No- wheezing.   Skin: No-   rash or lesions. GI:  +heartburn, indigestion, No-abdominal pain, nausea, vomiting, GU:  MS:  No-   joint pain or swelling. . Neuro-     nothing unusual Psych:  No- change in mood or affect. +depression or anxiety.  No memory loss.  OBJ- Physical Exam General- Alert, Oriented, Affect-appropriate, Distress- none acute Skin- rash-none, tattoos, suntan Lymphadenopathy- none Head- atraumatic            Eyes- Gross vision intact, PERRLA, conjunctivae and secretions clear            Ears- Hearing, canals-normal            Nose- +turbinate edema, no-Septal dev, mucus, polyps, erosion, perforation             Throat- Mallampati II , mucosa clear , drainage- none, tonsils- atrophic. Missing teeth Neck- flexible , trachea midline, no stridor , thyroid nl, carotid no bruit Chest - symmetrical excursion , unlabored           Heart/CV- RRR , no murmur , no gallop  , no rub, nl s1 s2                           -  JVD- none , edema- none, stasis changes- none, varices- none           Lung- +diminished, wheeze-none, cough- none , dullness-none, rub- none           Chest wall-  Abd-  Br/ Gen/ Rectal- Not done, not indicated Extrem- cyanosis- none, clubbing, none, atrophy- none, strength- nl Neuro- grossly intact to observation

## 2013-07-02 NOTE — Patient Instructions (Addendum)
Keep trying to stop smoking!  Avoid sleeping on your back  Try script for protriptyline/ Vivactil to see if it reduces snoring and has you feeling less tired in the daytime.  Your allergy antibody levels are high enough that allergy may be important. Antihistamines and your breathing meds are fine if they work well enough. We can talk about other tools if needed.

## 2013-07-03 ENCOUNTER — Encounter: Payer: Self-pay | Admitting: Gastroenterology

## 2013-07-09 ENCOUNTER — Telehealth: Payer: Self-pay | Admitting: Family Medicine

## 2013-07-09 NOTE — Telephone Encounter (Signed)
Patient called requesting prednisone and doxycycline. Advised needed to be seen but stated that Dr. Katrinka Blazing will send in because this happens often. For several days he has been having chest congestion, cough, productive/little green at times, and having increased SOB since this started. [facility]. Please advise

## 2013-07-10 NOTE — Telephone Encounter (Signed)
Call --- needs OV; offer to see today at 12:30 by appointment or I work Wednesday and Thursday evenings 5-10 and Friday 10-2 at 102.

## 2013-07-10 NOTE — Telephone Encounter (Signed)
Left message to return call 

## 2013-07-11 NOTE — Telephone Encounter (Signed)
Advised pt that he must come in. Pt agreed

## 2013-07-12 ENCOUNTER — Ambulatory Visit (INDEPENDENT_AMBULATORY_CARE_PROVIDER_SITE_OTHER): Payer: BC Managed Care – PPO | Admitting: Family Medicine

## 2013-07-12 VITALS — BP 120/88 | HR 88 | Temp 98.0°F | Resp 16 | Ht 70.0 in | Wt 168.0 lb

## 2013-07-12 DIAGNOSIS — J209 Acute bronchitis, unspecified: Secondary | ICD-10-CM

## 2013-07-12 DIAGNOSIS — J441 Chronic obstructive pulmonary disease with (acute) exacerbation: Secondary | ICD-10-CM

## 2013-07-12 DIAGNOSIS — F341 Dysthymic disorder: Secondary | ICD-10-CM

## 2013-07-12 MED ORDER — SUCRALFATE 1 G PO TABS: 1.0000 g | ORAL_TABLET | Freq: Four times a day (QID) | ORAL | Status: DC

## 2013-07-12 MED ORDER — PREDNISONE 20 MG PO TABS: ORAL_TABLET | ORAL | Status: DC

## 2013-07-12 MED ORDER — ALBUTEROL SULFATE (2.5 MG/3ML) 0.083% IN NEBU: 2.5000 mg | INHALATION_SOLUTION | Freq: Once | RESPIRATORY_TRACT | Status: DC

## 2013-07-12 MED ORDER — DOXYCYCLINE HYCLATE 100 MG PO CAPS: 100.0000 mg | ORAL_CAPSULE | Freq: Two times a day (BID) | ORAL | Status: DC

## 2013-07-12 NOTE — Progress Notes (Signed)
[address]   [place], [place]  [address]   [phone]  Subjective:    Patient ID: Leslie Sims, male    DOB: 02-01-1951, 62 y.o.   MRN: [id number]  HPI This 62 y.o. male presents for evaluation of shortness of breath, cough.  Onset of chest congestion and SOB started one week ago.  No fever/chills/sweats.  Light headache. Mild sore throat; no ear pain; +rhinorrhea; mild nasal congestion; some coughing; +light green and thick; no increase sputum production.  +wheezing: nebulizer tid.  Advair and Spiriva.  Still DOE; can walk far.  Mornings and afternoons feel badly; mornings are really bad.  Animals cat and two dogs.  S/p blood testing for allergies; +test result for all allergens except goose.  Dr. Maple Hudson recommended allergy shots; patient considering.    2.  Depression: increased Zoloft and stopped nerve medication; prescribed new medication but has not started taking yet.  Prescribed Trazodone 50mg  1-2 qhs. Had decreased Zoloft to 100mg  on now before appointment; recommended increasing to 150mg  daily.  Follow up with Maryruth Bun in two weeks.   3. GERD: needs refill of Carafate sent to [facility].   Review of Systems  Constitutional: Negative for fever, chills, diaphoresis and fatigue.  HENT: Positive for congestion and sore throat. Negative for ear pain, rhinorrhea, sneezing, trouble swallowing, voice change and postnasal drip.   Respiratory: Positive for cough, shortness of breath and wheezing.   Cardiovascular: Negative for chest pain, palpitations and leg swelling.  Gastrointestinal: Negative for nausea, vomiting and diarrhea.  Neurological: Positive for headaches. Negative for dizziness and light-headedness.  Hematological: Negative for adenopathy.   Past Medical History  Diagnosis Date  . Hyperlipidemia   . COPD (chronic obstructive pulmonary disease)   . Depression   . GERD (gastroesophageal reflux disease)   . Colon polyps 08/2012    colonoscopy; multiple colon polyps;  repeat colonoscopy in one year.  . Status post dilation of esophageal narrowing 2000  . Peptic ulcer   . Hypertension   . CARDIOMYOPATHY 03/24/2010    did not tolerate Toprol and Lisinopril.   . Insomnia   . Hemorrhoids   . MI (myocardial infarction)   . Atherosclerosis   . Anxiety   . Headache(784.0)   . Tinea pedis   . Myalgia   . Helicobacter pylori (H. pylori)   . Thyroid dysfunction   . Chest pain   . Back pain   . Hernia     inguinal  . Allergic rhinitis   . Bronchitis     09-14-11  . Chronic airway obstruction, not elsewhere classified   . Coronary artery disease 11/2009    Anterior MI with late presentation 11/2009. Cath showed a 100% proximal LAD occlusion, 99% mid RCA. Had a PCI and 2 DES to both LAD and RCA. EF was 35%. Nuclear stress test 05/13: prior anterior/inferior infarcts without ischemic, EF 43%, cardiac cath in 02/14: Widely patent stents with no other obstructive disease. EF 40%    Current Outpatient Prescriptions on File Prior to Visit  Medication Sig Dispense Refill  . albuterol (VENTOLIN HFA) 108 (90 BASE) MCG/ACT inhaler Inhale 2 puffs into the lungs every 6 (six) hours as needed. For wheezing      . aspirin 81 MG tablet Take 81 mg by mouth daily.        [name] atorvastatin (LIPITOR) 20 MG tablet Take 1 tablet (20 mg total) by mouth daily.  90 tablet  3  . clopidogrel (PLAVIX) 75  MG tablet Take 1 tablet (75 mg total) by mouth daily.  90 tablet  3  . Dextromethorphan-Guaifenesin (MUCINEX FAST-MAX DM MAX) 5-100 MG/5ML LIQD Take 20 mg by mouth as needed.       [name] esomeprazole (NEXIUM) 40 MG capsule Take 1 capsule (40 mg total) by mouth daily before breakfast.  90 capsule  3  . fluticasone (FLONASE) 50 MCG/ACT nasal spray Place 2 sprays into the nose daily.      . Fluticasone-Salmeterol (ADVAIR DISKUS) 500-50 MCG/DOSE AEPB Inhale 1 puff into the lungs every 12 (twelve) hours.        . Hypertonic Nasal Wash (SINUS RINSE NA) Place 1 Package into the nose as needed.        . loratadine (CLARITIN) 10 MG tablet Take 10 mg by mouth daily as needed.       [name] losartan (COZAAR) 25 MG tablet Take 1 tablet (25 mg total) by mouth daily.  30 tablet  6  . vitamin C (ASCORBIC ACID) 500 MG tablet Take 500 mg by mouth daily.      [name] levalbuterol (XOPENEX) 0.63 MG/3ML nebulizer solution Take 1 ampule by nebulization every 4 (four) hours as needed.        [name] LORazepam (ATIVAN) 0.5 MG tablet Take 1 tablet (0.5 mg total) by mouth at bedtime.  30 tablet  5  . nitroGLYCERIN (NITROSTAT) 0.4 MG SL tablet Place 1 tablet (0.4 mg total) under the tongue every 5 (five) minutes as needed.  25 tablet  3  . nystatin-triamcinolone ointment (MYCOLOG) Apply topically 3 (three) times daily.  60 g  0  . protriptyline (VIVACTIL) 5 MG tablet Take 1 tablet (5 mg total) by mouth at bedtime.  30 tablet  5  . Pseudoephedrine-DM-GG (ROBITUSSIN COLD & COUGH PO) As directed per bottle when needed for cough      . sertraline (ZOLOFT) 50 MG tablet Take 2  tablets daily.      [name] tiotropium (SPIRIVA) 18 MCG inhalation capsule Place 18 mcg into inhaler and inhale daily.         No current facility-administered medications on file prior to visit.       Objective:   Physical Exam  Nursing note and vitals reviewed. Constitutional: He is oriented to person, place, and time. He appears well-developed and well-nourished. No distress.  HENT:  Head: Normocephalic and atraumatic.  Right Ear: External ear normal.  Left Ear: External ear normal.  Nose: Nose normal.  Mouth/Throat: Oropharynx is clear and moist.  Eyes: Conjunctivae are normal. Pupils are equal, round, and reactive to light.  Neck: Normal range of motion. Neck supple.  Cardiovascular: Normal rate, regular rhythm and normal heart sounds.   Pulmonary/Chest: No respiratory distress. He has no decreased breath sounds. He has wheezes in the right upper field, the right middle field, the right lower field, the left upper field, the left middle field and the  left lower field. He has no rhonchi. He has no rales.  Distant breath sounds throughout; speaking complete sentences.  Diffuse scant end expiratory wheezing.    Neurological: He is alert and oriented to person, place, and time.  Skin: He is not diaphoretic.  Psychiatric: He has a normal mood and affect. His behavior is normal.    Post ambulation pulse oximetry of 91%; RR 32 post-ambulation with recovery to 24 RR at rest.Pulse oximetry at rest 95%  ALBUTEROL 5MG  NEBULIZER ADMINISTERED IN OFFICE.  Improved air movement after nebulizer.    Assessment &  Plan:  Acute bronchitis - Plan: doxycycline (VIBRAMYCIN) 100 MG capsule, albuterol (PROVENTIL) (2.5 MG/3ML) 0.083% nebulizer solution 2.5 mg, albuterol (PROVENTIL) (2.5 MG/3ML) 0.083% nebulizer solution 2.5 mg  COPD exacerbation - Plan: predniSONE (DELTASONE) 20 MG tablet, albuterol (PROVENTIL) (2.5 MG/3ML) 0.083% nebulizer solution 2.5 mg, albuterol (PROVENTIL) (2.5 MG/3ML) 0.083% nebulizer solution 2.5 mg  DEPRESSION/ANXIETY - Plan: albuterol (PROVENTIL) (2.5 MG/3ML) 0.083% nebulizer solution 2.5 mg, albuterol (PROVENTIL) (2.5 MG/3ML) 0.083% nebulizer solution 2.5 mg   1. Acute bronchitis:  New.  Rx for Doxycycline provided. 2 . COPD exacerbation: New.  Rx for Prednisone provided; s/p albuterol nebulizer 5mg  in office.  Continue nebulizer at home tid to qid scheduled.  Avoid dust exposure. 3.  Allergic rhinitis: s/p allergy testing by blood per Dr. Maple Hudson; recommend allergy shots. 4.  GERD: stable; rx for Carafate sent to local pharmacy. 5. Depression: stable; s/p psychiatry consultation; Zoloft increased to 150mg  daily.  Meds ordered this encounter  Medications  . sucralfate (CARAFATE) 1 G tablet    Sig: Take 1 tablet (1 g total) by mouth 4 (four) times daily.    Dispense:  360 tablet    Refill:  1  . doxycycline (VIBRAMYCIN) 100 MG capsule    Sig: Take 1 capsule (100 mg total) by mouth 2 (two) times daily.    Dispense:  20 capsule     Refill:  0  . predniSONE (DELTASONE) 20 MG tablet    Sig: Three tablets daily x 1 day then two tablets daily x 5 days then one tablet daily x 5 days    Dispense:  18 tablet    Refill:  0  . albuterol (PROVENTIL) (2.5 MG/3ML) 0.083% nebulizer solution 2.5 mg    Sig:   . albuterol (PROVENTIL) (2.5 MG/3ML) 0.083% nebulizer solution 2.5 mg    Sig:

## 2013-07-18 DIAGNOSIS — G479 Sleep disorder, unspecified: Secondary | ICD-10-CM | POA: Insufficient documentation

## 2013-07-18 NOTE — Assessment & Plan Note (Signed)
Smoking cessation conversation initiated again today. He is not motivated to stop smoking

## 2013-07-18 NOTE — Assessment & Plan Note (Signed)
Aspergillus IgE level remains quite high. We can arrange to skin test for Aspergillus when he returns. Consider sputum culture for fungus, watching for potential diagnosis of ABPA.

## 2013-07-18 NOTE — Assessment & Plan Note (Signed)
I haven't seen the formal report from his sleep study. We would not prescribe CPAP for UARS. We did discuss trying Vivactil as a nonsedating/likely stimulating antidepressant. This has been used in the past occasionally to suppress snore of sleep apnea marginally, although never confirmed on study trials. Plan- try Vivactil for "energy" and UARS. Deliberately giving it at bedtime.

## 2013-07-18 NOTE — Assessment & Plan Note (Signed)
Allergy Profile 05/18/2013-total IgE 664.8 with elevated specific antigens for almost everything on the panel.  Significantly atopic. We discussed availability of standard allergy vaccine and Xolair if appropriate. Right now he feels he is doing well enough with Flonase and antihistamines. 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

## 2013-07-22 ENCOUNTER — Telehealth: Payer: Self-pay | Admitting: Family Medicine

## 2013-07-22 DIAGNOSIS — J441 Chronic obstructive pulmonary disease with (acute) exacerbation: Secondary | ICD-10-CM

## 2013-07-22 MED ORDER — LEVOFLOXACIN 750 MG PO TABS: 750.0000 mg | ORAL_TABLET | Freq: Every day | ORAL | Status: DC

## 2013-07-22 MED ORDER — PREDNISONE 20 MG PO TABS: ORAL_TABLET | ORAL | Status: DC

## 2013-07-22 NOTE — Telephone Encounter (Signed)
Patient calling; requesting additional medication; completed Doxycycline and Prednisone.  Still coughing with mucous production (light green), chest tightness.  A/P: Acute bronchitis with COPD exacerbation: rx for Levaquin and Prednisone sent.

## 2013-08-21 ENCOUNTER — Ambulatory Visit: Payer: BC Managed Care – PPO | Admitting: Family Medicine

## 2013-08-31 LAB — PULMONARY FUNCTION TEST

## 2013-09-11 ENCOUNTER — Ambulatory Visit: Payer: BC Managed Care – PPO

## 2013-09-11 ENCOUNTER — Ambulatory Visit (INDEPENDENT_AMBULATORY_CARE_PROVIDER_SITE_OTHER): Payer: BC Managed Care – PPO | Admitting: Family Medicine

## 2013-09-11 VITALS — BP 132/82 | HR 82 | Temp 98.5°F | Resp 17 | Ht 68.5 in | Wt 160.0 lb

## 2013-09-11 DIAGNOSIS — IMO0001 Reserved for inherently not codable concepts without codable children: Secondary | ICD-10-CM

## 2013-09-11 DIAGNOSIS — F341 Dysthymic disorder: Secondary | ICD-10-CM

## 2013-09-11 DIAGNOSIS — R079 Chest pain, unspecified: Secondary | ICD-10-CM

## 2013-09-11 DIAGNOSIS — K219 Gastro-esophageal reflux disease without esophagitis: Secondary | ICD-10-CM

## 2013-09-11 DIAGNOSIS — M791 Myalgia, unspecified site: Secondary | ICD-10-CM

## 2013-09-11 DIAGNOSIS — J441 Chronic obstructive pulmonary disease with (acute) exacerbation: Secondary | ICD-10-CM

## 2013-09-11 LAB — POCT CBC
Lymph, poc: 2.2 (ref 0.6–3.4)
MCH, POC: 29.7 pg (ref 27–31.2)
MCHC: 31.6 g/dL — AB (ref 31.8–35.4)
MID (cbc): 0.7 (ref 0–0.9)
MPV: 8.3 fL (ref 0–99.8)
POC Granulocyte: 5.4 (ref 2–6.9)
POC MID %: 7.9 %M (ref 0–12)
Platelet Count, POC: 258 10*3/uL (ref 142–424)
RBC: 4.88 M/uL (ref 4.69–6.13)
WBC: 8.3 10*3/uL (ref 4.6–10.2)

## 2013-09-11 IMAGING — CR DG CHEST 2V
2 series · 2 of 2 positions shown · non-contrast
Comparison: PA and lateral chest [DATE] and [DATE].

CLINICAL DATA: Cough, congestion and shortness of breath.

EXAM:
CHEST  2 VIEW

[lateral]
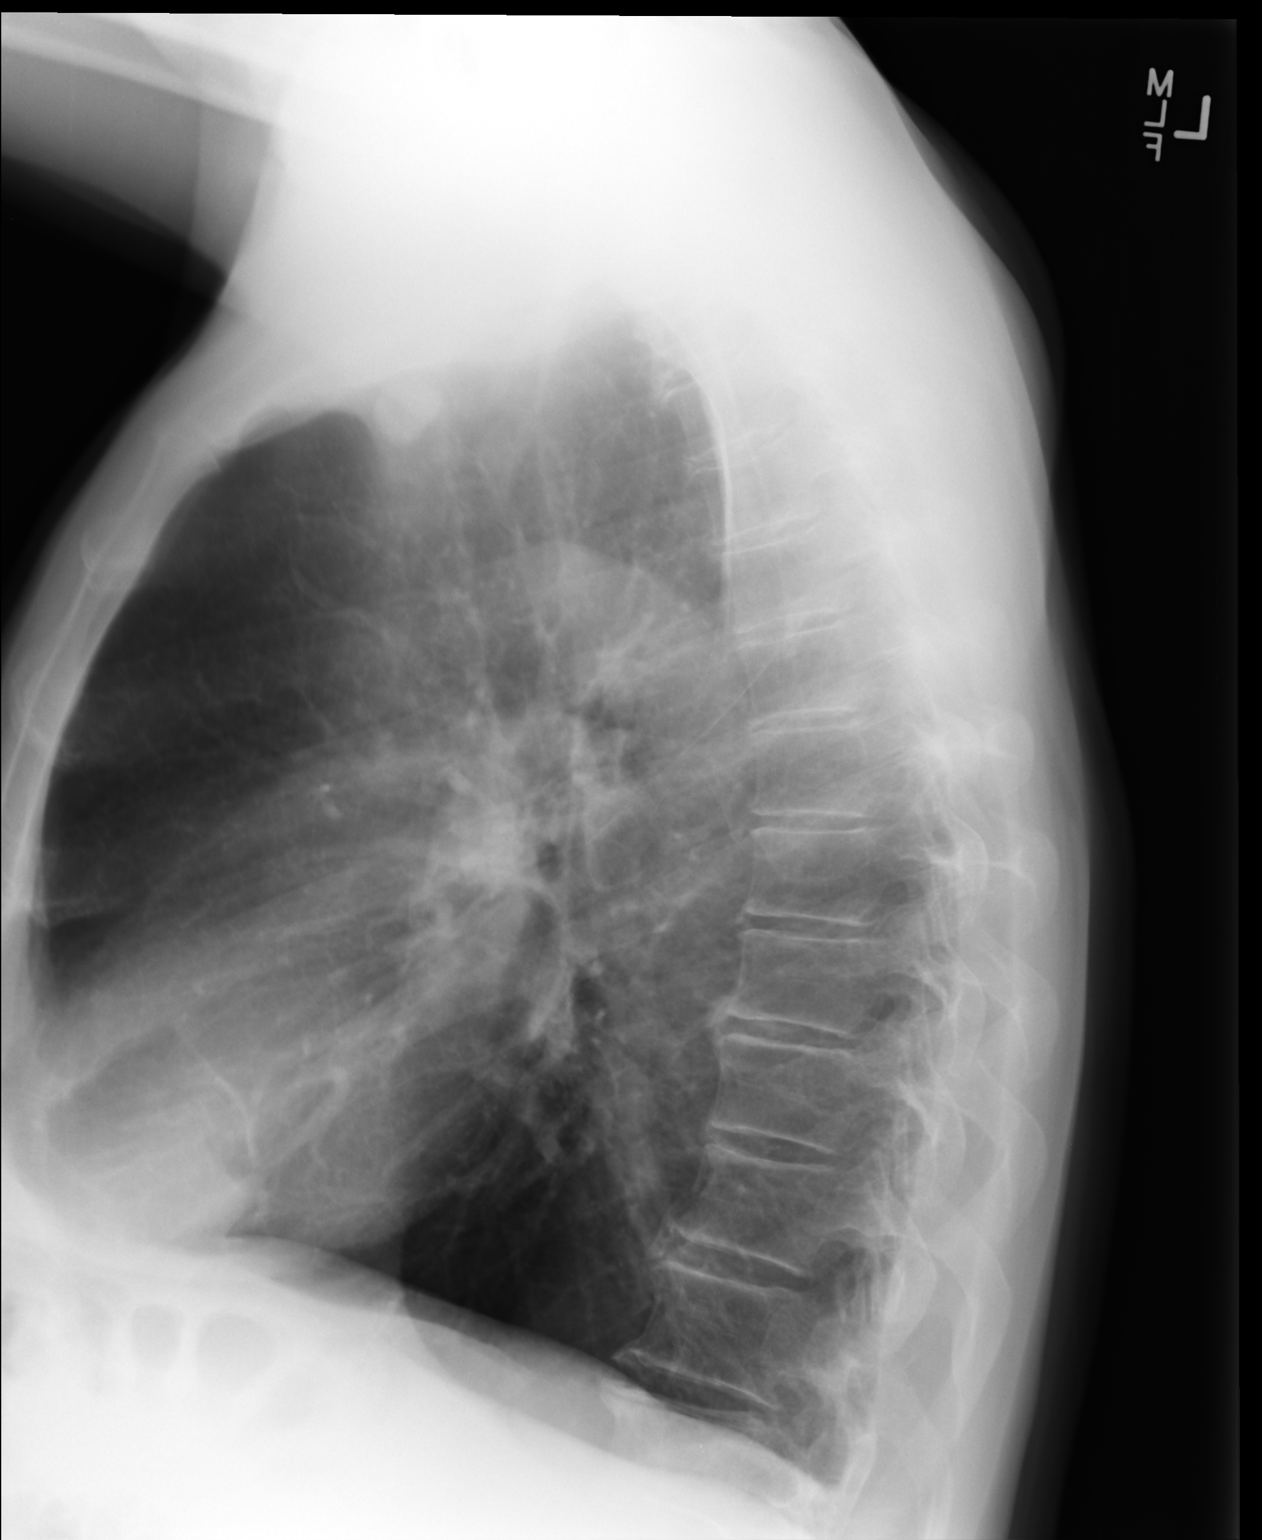

[PA]
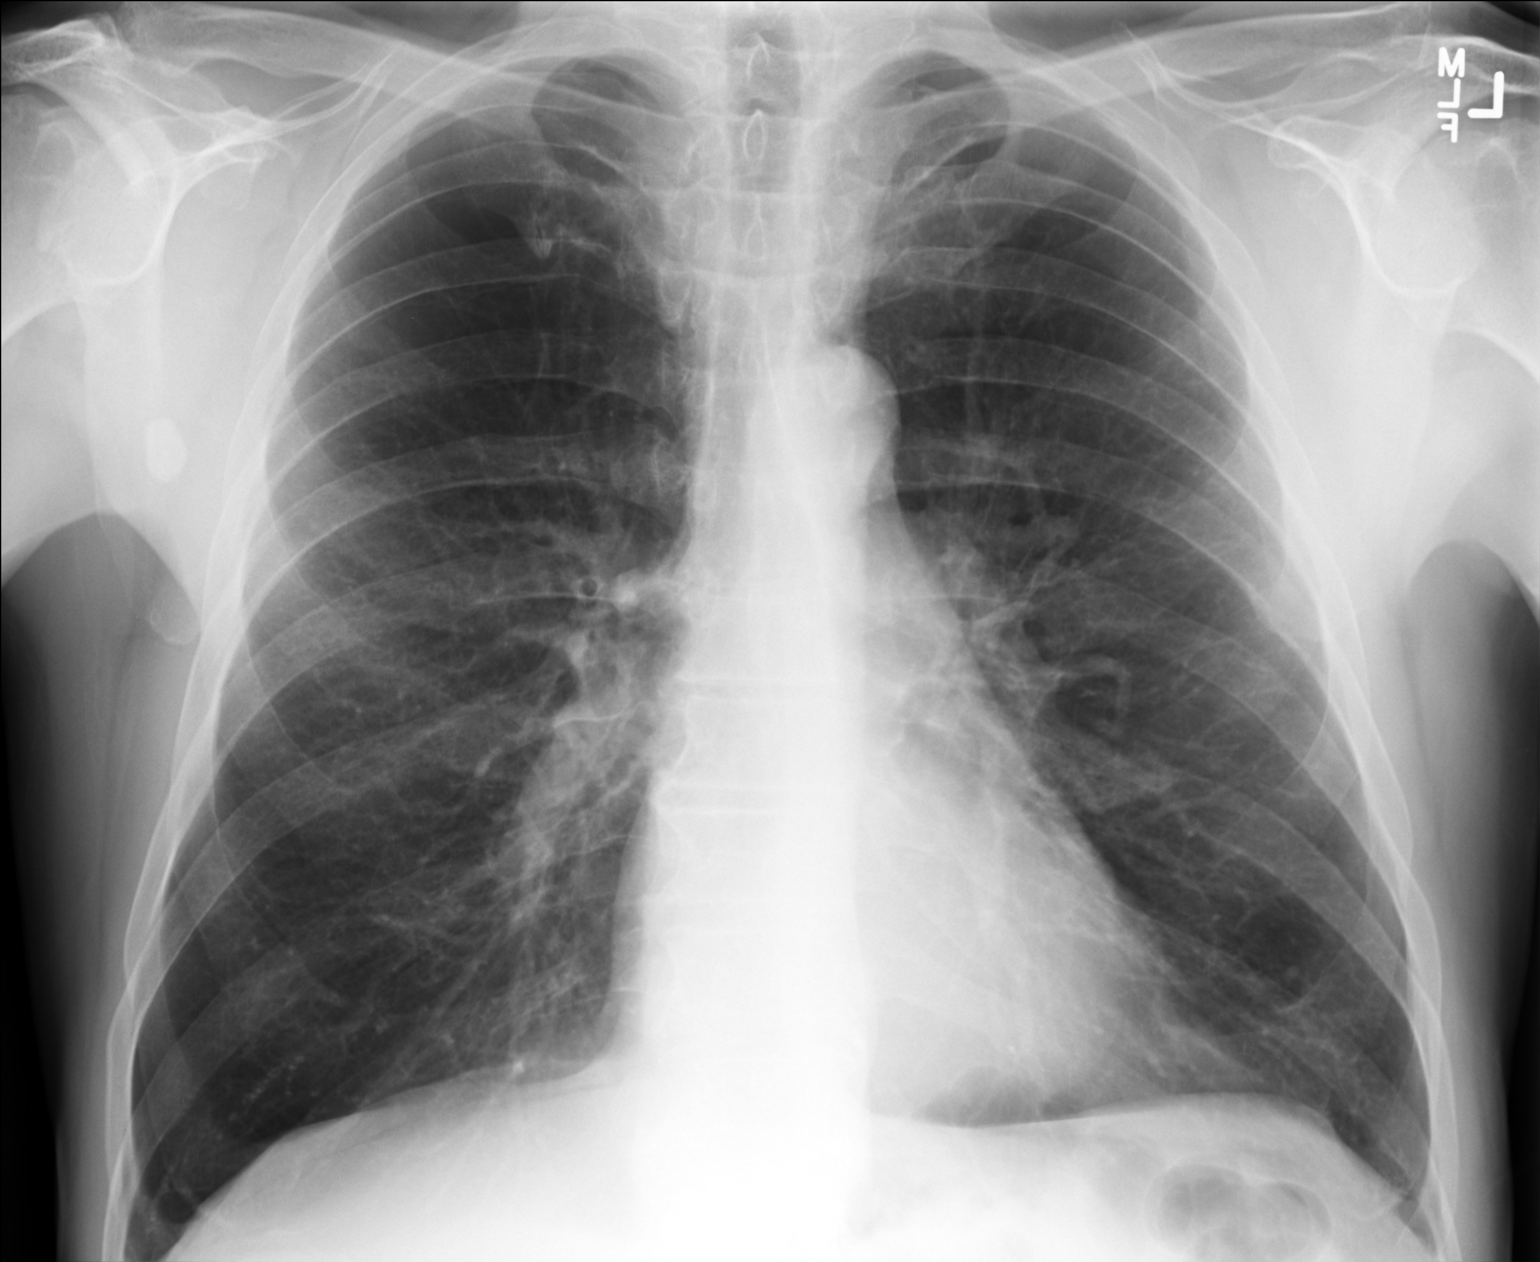

[2 of 2 positions shown; findings below may reference images not displayed]

FINDINGS: The lungs are hyperexpanded with attenuation of the pulmonary
vasculature. No consolidative process, pneumothorax or effusion is
identified. Heart size is normal.
IMPRESSION: Emphysema without acute disease.

## 2013-09-11 MED ORDER — PREDNISONE 20 MG PO TABS: ORAL_TABLET | ORAL | Status: DC

## 2013-09-11 NOTE — Patient Instructions (Addendum)
1. INCREASE NEXIUM TO TWICE DAILY.   2.  TAKE NERVE PILL EVERY EVENING TO LOOSEN MUSCLE.

## 2013-09-11 NOTE — Progress Notes (Signed)
[address]   [place], [place]  [address]   [phone]  Subjective:    Patient ID: DIETER HANE, male    DOB: [birth year], 62 y.o.   MRN: [id number]  HPI This 62 y.o. male presents for evaluation of chest pain.    1. Chest pain:  Burning sensation; starts from epigastric region to L anterior chest.  Not like heart attack.  Mild SOB.  Eating, walking, sitting makes worse.  +body aches present.  2. COPD:  One week ago, working in [employer]; particles of [employer].  +coughing.  +wheezing.  No change in sputum.  Using inhalers as prescribed.    3.  GERD: worsening lately; thinks chest pain starting in stomach.    4.  CAD:  Not worried about heart; chest pain feels different.    5.  Anxiety with depression: worsening in past few weeks; completed move recently; wife has been sick and unable to help much with move.  Patient having to take on more and more responsibility at home.   Review of Systems  Constitutional: Positive for appetite change. Negative for fever, chills, diaphoresis and fatigue.  HENT: Negative for congestion, ear pain, postnasal drip, rhinorrhea and sore throat.   Respiratory: Positive for cough, shortness of breath and wheezing. Negative for stridor.   Cardiovascular: Positive for chest pain. Negative for palpitations and leg swelling.  Gastrointestinal: Positive for abdominal pain and abdominal distention. Negative for nausea, vomiting, diarrhea, blood in stool and anal bleeding.  Neurological: Negative for dizziness, numbness and headaches.   Past Medical History  Diagnosis Date  . Hyperlipidemia   . COPD (chronic obstructive pulmonary disease)   . Depression   . GERD (gastroesophageal reflux disease)   . Colon polyps 08/2012    colonoscopy; multiple colon polyps; repeat colonoscopy in one year.  . Status post dilation of esophageal narrowing 2000  . Peptic ulcer   . Hypertension   . CARDIOMYOPATHY 03/24/2010    did not tolerate Toprol and Lisinopril.   .  Insomnia   . Hemorrhoids   . MI (myocardial infarction)   . Atherosclerosis   . Anxiety   . Headache(784.0)   . Tinea pedis   . Myalgia   . Helicobacter pylori (H. pylori)   . Thyroid dysfunction   . Chest pain   . Back pain   . Hernia     inguinal  . Allergic rhinitis   . Bronchitis     09-14-11  . Chronic airway obstruction, not elsewhere classified   . Coronary artery disease 11/2009    Anterior MI with late presentation 11/2009. Cath showed a 100% proximal LAD occlusion, 99% mid RCA. Had a PCI and 2 DES to both LAD and RCA. EF was 35%. Nuclear stress test 05/13: prior anterior/inferior infarcts without ischemic, EF 43%, cardiac cath in 02/14: Widely patent stents with no other obstructive disease. EF 40%    Allergies  Allergen Reactions  . Wellbutrin [Bupropion] Other (See Comments)    Unknown/"made me feel real funny"   Current Outpatient Prescriptions on File Prior to Visit  Medication Sig Dispense Refill  . aspirin 81 MG tablet Take 81 mg by mouth daily.        [name] atorvastatin (LIPITOR) 20 MG tablet Take 1 tablet (20 mg total) by mouth daily.  90 tablet  3  . clopidogrel (PLAVIX) 75 MG tablet Take 1 tablet (75 mg total) by mouth daily.  90 tablet  3  . losartan (COZAAR) 25 MG tablet  Take 1 tablet (25 mg total) by mouth daily.  30 tablet  6  . nitroGLYCERIN (NITROSTAT) 0.4 MG SL tablet Place 1 tablet (0.4 mg total) under the tongue every 5 (five) minutes as needed.  25 tablet  3  . Pseudoephedrine-DM-GG (ROBITUSSIN COLD & COUGH PO) As directed per bottle when needed for cough       Current Facility-Administered Medications on File Prior to Visit  Medication Dose Route Frequency Provider Last Rate Last Dose  . albuterol (PROVENTIL) (2.5 MG/3ML) 0.083% nebulizer solution 2.5 mg  2.5 mg Nebulization Once [name], MD      . albuterol (PROVENTIL) (2.5 MG/3ML) 0.083% nebulizer solution 2.5 mg  2.5 mg Nebulization Once [name], MD           Objective:   Physical  Exam  Nursing note and vitals reviewed. Constitutional: He is oriented to person, place, and time. He appears well-developed and well-nourished. No distress.  HENT:  Head: Normocephalic and atraumatic.  Right Ear: External ear normal.  Left Ear: External ear normal.  Nose: Nose normal.  Mouth/Throat: Oropharynx is clear and moist.  Eyes: Conjunctivae and EOM are normal. Pupils are equal, round, and reactive to light.  Neck: Normal range of motion. Neck supple. No thyromegaly present.  Cardiovascular: Normal rate, regular rhythm, normal heart sounds and intact distal pulses.  Exam reveals no gallop and no friction rub.   No murmur heard. Pulmonary/Chest: Effort normal. He has wheezes. He has no rales.  Distant breath sounds throughout; +scant wheezing expiration throughout; good air movement.  Abdominal: Soft. Bowel sounds are normal. He exhibits no distension and no mass. There is tenderness in the epigastric area. There is no rebound and no guarding.  Lymphadenopathy:    He has no cervical adenopathy.  Neurological: He is alert and oriented to person, place, and time.  Skin: Skin is warm and dry. No rash noted. He is not diaphoretic.  Psychiatric: He has a normal mood and affect. His behavior is normal.    Results for orders placed in visit on 09/11/13  COMPREHENSIVE METABOLIC PANEL      Result Value Range   Sodium 142  135 - 145 mEq/L   Potassium 3.9  3.5 - 5.3 mEq/L   Chloride 106  96 - 112 mEq/L   CO2 29  19 - 32 mEq/L   Glucose, Bld 110 (*) 70 - 99 mg/dL   BUN 8  6 - 23 mg/dL   Creat 1.91  4.78 - 2.95 mg/dL   Total Bilirubin 0.3  0.3 - 1.2 mg/dL   Alkaline Phosphatase 84  39 - 117 U/L   AST 15  0 - 37 U/L   ALT 14  0 - 53 U/L   Total Protein 6.6  6.0 - 8.3 g/dL   Albumin 4.3  3.5 - 5.2 g/dL   Calcium 9.5  8.4 - 62.1 mg/dL  CK      Result Value Range   Total CK 52  7 - 232 U/L  SEDIMENTATION RATE      Result Value Range   Sed Rate 4  0 - 16 mm/hr  POCT CBC       Result Value Range   WBC 8.3  4.6 - 10.2 K/uL   Lymph, poc 2.2  0.6 - 3.4   POC LYMPH PERCENT 27.0  10 - 50 %L   MID (cbc) 0.7  0 - 0.9   POC MID % 7.9  0 - 12 %  M   POC Granulocyte 5.4  2 - 6.9   Granulocyte percent 65.1  37 - 80 %G   RBC 4.88  4.69 - 6.13 M/uL   Hemoglobin 14.5  14.1 - 18.1 g/dL   HCT, POC 86.5  78.4 - 53.7 %   MCV 94.0  80 - 97 fL   MCH, POC 29.7  27 - 31.2 pg   MCHC 31.6 (*) 31.8 - 35.4 g/dL   RDW, POC 69.6     Platelet Count, POC 258  142 - 424 K/uL   MPV 8.3  0 - 99.8 fL   [facility] reading (PRIMARY) by  Dr. Katrinka Blazing. CXR: hyperexpansion; NAD.  EKG: NSR; T wave changes old; no acute changes.    Assessment & Plan:  Chest pain - Plan: DG Chest 2 View, EKG 12-Lead, CANCELED: EKG 12-Lead  Myalgia - Plan: POCT CBC, Comprehensive metabolic panel, CK, Sedimentation Rate  COPD exacerbation  DEPRESSION/ANXIETY  GERD     1.  Chest pain:  New.  Not consistent with cardiac origin; EKG stable; CXR negative; treat COPD exacerbation and reevaluate.  Also increase use of Carafate to qid.   2.  COPD exacerbation:  New.  Rx for Prednisone provided; continue other home regimen. 3. Myalgias; New. Obtain labs including ESR, CK.   4.  Depression/anxiety: worsening; family stressors lately with recent move and acute illness of wife.  Counseling provided; followed by psychiatry. 5. GERD:  Worsening; continue Nexium; increase Carafate to qid.  Meds ordered this encounter  Medications  . DISCONTD: predniSONE (DELTASONE) 20 MG tablet    Sig: Two tablets daily x 5 days then one tablet daily x 5 days    Dispense:  15 tablet    Refill:  0

## 2013-09-12 LAB — COMPREHENSIVE METABOLIC PANEL
ALT: 14 U/L (ref 0–53)
AST: 15 U/L (ref 0–37)
Creat: 0.8 mg/dL (ref 0.50–1.35)
Sodium: 142 mEq/L (ref 135–145)
Total Bilirubin: 0.3 mg/dL (ref 0.3–1.2)

## 2013-09-12 LAB — CK: Total CK: 52 U/L (ref 7–232)

## 2013-09-13 ENCOUNTER — Ambulatory Visit: Payer: BC Managed Care – PPO | Admitting: Family Medicine

## 2013-09-19 HISTORY — PX: CARDIAC CATHETERIZATION: SHX172

## 2013-09-27 ENCOUNTER — Ambulatory Visit: Payer: BC Managed Care – PPO

## 2013-09-27 ENCOUNTER — Telehealth: Payer: Self-pay | Admitting: *Deleted

## 2013-09-27 ENCOUNTER — Ambulatory Visit (INDEPENDENT_AMBULATORY_CARE_PROVIDER_SITE_OTHER): Payer: BC Managed Care – PPO | Admitting: Family Medicine

## 2013-09-27 VITALS — BP 120/78 | HR 93 | Temp 99.0°F | Resp 17 | Ht 69.5 in | Wt 161.0 lb

## 2013-09-27 DIAGNOSIS — R079 Chest pain, unspecified: Secondary | ICD-10-CM

## 2013-09-27 DIAGNOSIS — R0602 Shortness of breath: Secondary | ICD-10-CM

## 2013-09-27 DIAGNOSIS — D649 Anemia, unspecified: Secondary | ICD-10-CM

## 2013-09-27 DIAGNOSIS — R51 Headache: Secondary | ICD-10-CM

## 2013-09-27 DIAGNOSIS — R1013 Epigastric pain: Secondary | ICD-10-CM

## 2013-09-27 DIAGNOSIS — J209 Acute bronchitis, unspecified: Secondary | ICD-10-CM

## 2013-09-27 LAB — POCT CBC
Granulocyte percent: 69.1 %G (ref 37–80)
HCT, POC: 39 % — AB (ref 43.5–53.7)
Hemoglobin: 12.2 g/dL — AB (ref 14.1–18.1)
Lymph, poc: 1.5 (ref 0.6–3.4)
MCHC: 31.3 g/dL — AB (ref 31.8–35.4)
MID (cbc): 0.5 (ref 0–0.9)
MPV: 8.2 fL (ref 0–99.8)
POC Granulocyte: 4.6 (ref 2–6.9)
POC MID %: 8.2 %M (ref 0–12)
Platelet Count, POC: 185 10*3/uL (ref 142–424)
RBC: 4.14 M/uL — AB (ref 4.69–6.13)

## 2013-09-27 LAB — IFOBT (OCCULT BLOOD): IFOBT: POSITIVE

## 2013-09-27 IMAGING — CR DG CHEST 2V
2 series · 2 of 2 positions shown · non-contrast
Comparison: [DATE]

CLINICAL DATA: Chest pain. Shortness of Breath.

EXAM:
CHEST  2 VIEW

[PA]
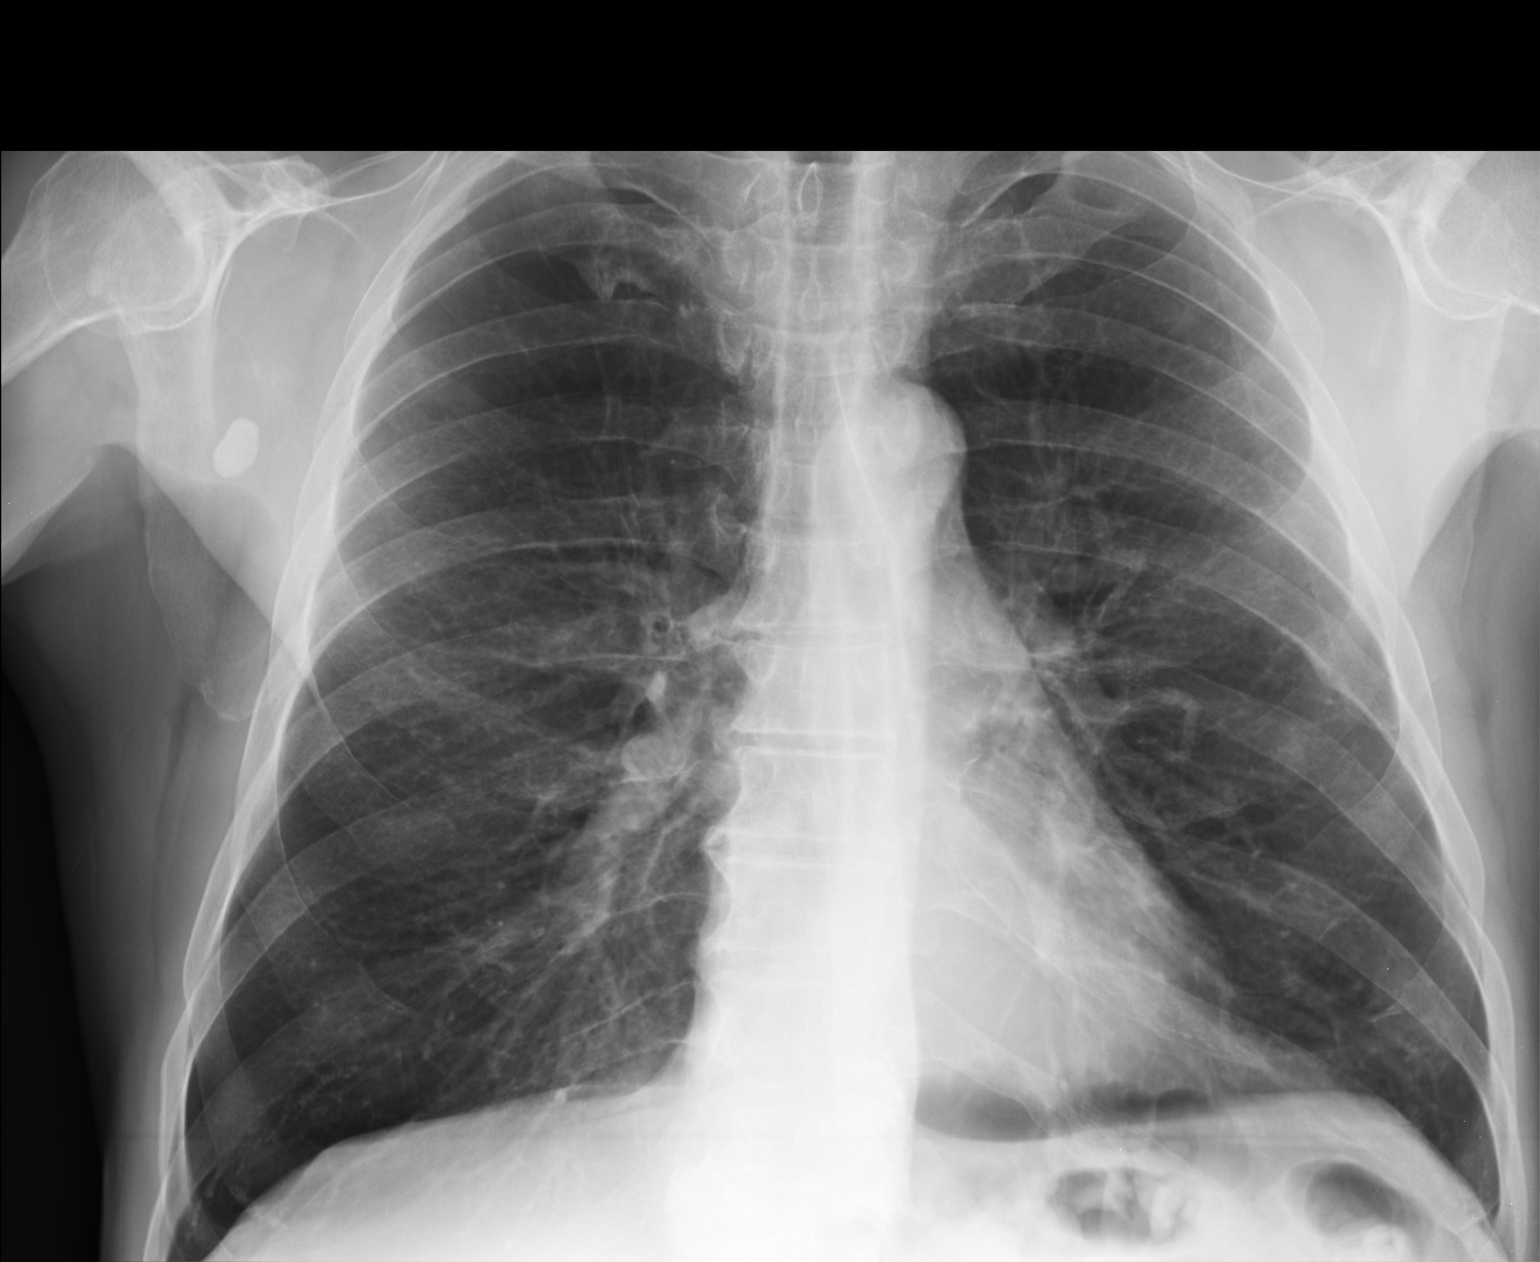

[lateral]
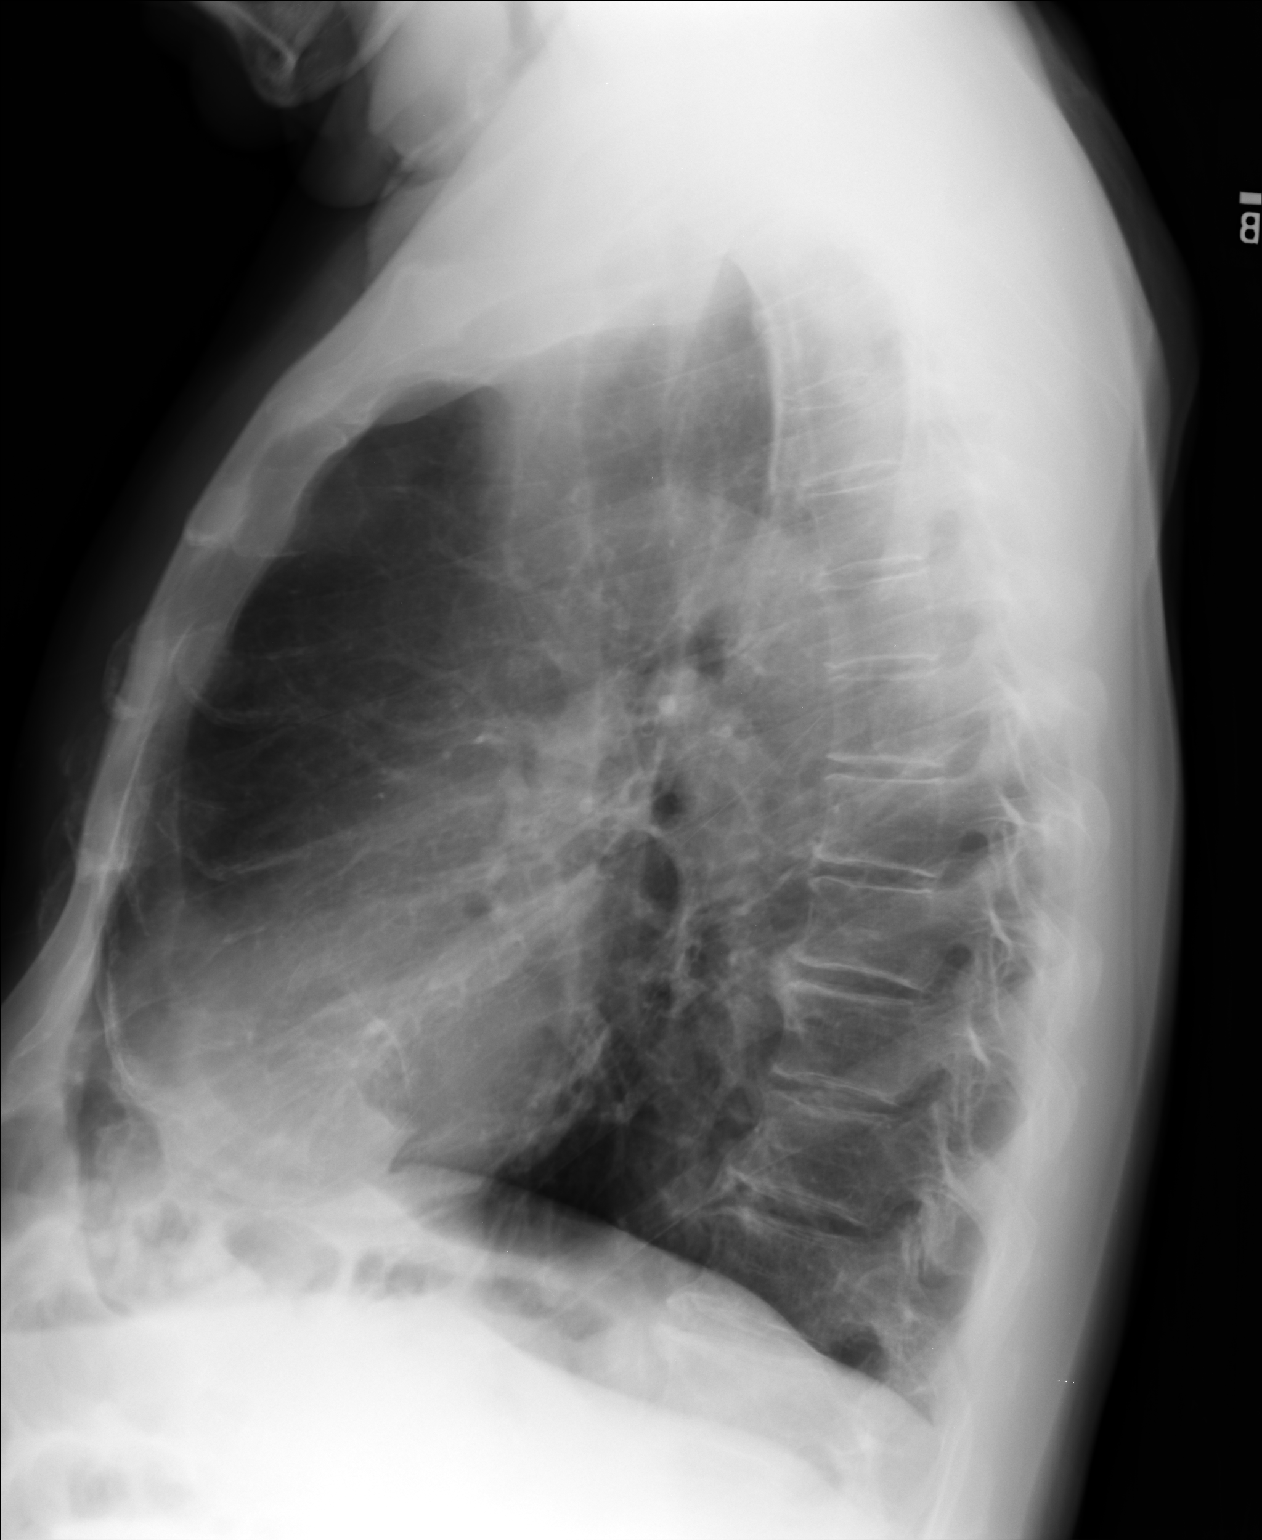

[2 of 2 positions shown; findings below may reference images not displayed]

FINDINGS: The heart size and mediastinal contours are within normal limits.

The lungs are hyperexpanded. There is a relative paucity of vascular
markings in the upper lobes consistent with emphysema. The lungs are
clear. No pleural effusion or pneumothorax.

The bony thorax is demineralized but intact.

Right axillary calcification, which may be in a lymph node, stable
from the prior study.
IMPRESSION: No acute cardiopulmonary disease.

COPD.

## 2013-09-27 MED ORDER — DOXYCYCLINE HYCLATE 100 MG PO CAPS: 100.0000 mg | ORAL_CAPSULE | Freq: Two times a day (BID) | ORAL | Status: DC

## 2013-09-27 MED ORDER — IPRATROPIUM BROMIDE 0.02 % IN SOLN
0.5000 mg | Freq: Once | RESPIRATORY_TRACT | Status: AC
  Administered 2013-09-27: 0.5 mg via RESPIRATORY_TRACT

## 2013-09-27 MED ORDER — ALBUTEROL SULFATE (5 MG/ML) 0.5% IN NEBU
2.5000 mg | INHALATION_SOLUTION | Freq: Once | RESPIRATORY_TRACT | Status: AC
  Administered 2013-09-27: 2.5 mg via RESPIRATORY_TRACT

## 2013-09-27 NOTE — Progress Notes (Signed)
[address]   [place], [place]  [address]   [phone]  Subjective:    Patient ID: LAQUINTON BIHM, male    DOB: 03/16/1951, 62 y.o.   MRN: [id number]  HPI This 62 y.o. male presents for evaluation of persistent symptoms.    1.  Chest burning: two week follow-up; s/p EKG and CXR stable; prescribed Prednisone with minimal relief in breathing.  Abdominal burning starts into abdominal region and radiates substernal and into L upper chest.  Taking Nexium daily; taking Carafate 3-4 times per day.  No relief.  Drinking milk calms down burning.  Constant burning sensation.  No worsening pain in mornings.  History of stomach ulcers.  Eating has no effect.  Lots of belching, indigestion, heartburn.  Wheezing and really DOE from baseline.  Small amount of phlegm.  No vomiting; no diarrhea; no black stools; no bloody stools.  Appetite is down for two days; decreased amount of eating.  Today went to book keepers office; went to [place]. Called cardiology; they recommended taking NTG x 3 without any relief in abdominal or chest burning.  Has a headache which started after taking NTG.  2. SOB: worsening; Prednisone helped a little two weeks ago.  No fever/chills/sweats.  No head congestion; mild hoarseness; FB sensation in throat.  Scant cough; +wheezing.  +SOB at night.  SOB going to bathroom; taking 2 puffs of inhaler after going to bathroom at night.  Compliance with Advair and Spiriva.  Using Albuterol twice per day on average; using nebulizer 1-2 times per day if needed.  Breathing fluctuates; can be labored and then can be fine; able to carry out normal daily activities.  3.  Anxiety: started Cymbalta three days ago.  Has been feeling worse since starting medication.  Review of Systems  Constitutional: Negative for chills, diaphoresis and fatigue.  HENT: Negative for postnasal drip, rhinorrhea, sinus pressure and sore throat.   Respiratory: Positive for cough, shortness of breath and wheezing.  Negative for stridor.   Cardiovascular: Positive for chest pain. Negative for palpitations and leg swelling.  Gastrointestinal: Positive for abdominal pain. Negative for nausea, vomiting, diarrhea, constipation, blood in stool, abdominal distention and anal bleeding.  Neurological: Positive for headaches. Negative for dizziness, tremors, facial asymmetry, light-headedness and numbness.  Psychiatric/Behavioral: Positive for dysphoric mood. The patient is nervous/anxious.    Past Medical History  Diagnosis Date  . Hyperlipidemia   . COPD (chronic obstructive pulmonary disease)   . Depression   . GERD (gastroesophageal reflux disease)   . Colon polyps 08/2012    colonoscopy; multiple colon polyps; repeat colonoscopy in one year.  . Status post dilation of esophageal narrowing 2000  . Peptic ulcer   . Hypertension   . CARDIOMYOPATHY 03/24/2010    did not tolerate Toprol and Lisinopril.   . Insomnia   . Hemorrhoids   . MI (myocardial infarction)   . Atherosclerosis   . Anxiety   . Headache(784.0)   . Tinea pedis   . Myalgia   . Helicobacter pylori (H. pylori)   . Thyroid dysfunction   . Chest pain   . Back pain   . Hernia     inguinal  . Allergic rhinitis   . Bronchitis     09-14-11  . Chronic airway obstruction, not elsewhere classified   . Coronary artery disease 11/2009    Anterior MI with late presentation 11/2009. Cath showed a 100% proximal LAD occlusion, 99% mid RCA. Had a PCI and 2 DES to  both LAD and RCA. EF was 35%. Nuclear stress test 05/13: prior anterior/inferior infarcts without ischemic, EF 43%, cardiac cath in 02/14: Widely patent stents with no other obstructive disease. EF 40%    Past Surgical History  Procedure Laterality Date  . Penile prosthesis implant    . Esophagogastroduodenoscopy  2008  . Colonoscopy    . Esophageal dilation    . Hernia repair  07/20/2012    L inguinal hernia repair  . Admission  12/20/2012    COPD exacerbation.  [facility].  .  Esophagogastroduodenoscopy  08/20/2012  . Cardiac catheterization    . Coronary angioplasty with stent placement  09/2009    LAD 3.0 X23 mm Xience DES, RCA: 4.0 X 15 mm Xience DES  . Cardiac catheterization  02/05/13    [facility]   Allergies  Allergen Reactions  . Wellbutrin [Bupropion] Other (See Comments)    Unknown/"made me feel real funny"   Current Outpatient Prescriptions on File Prior to Visit  Medication Sig Dispense Refill  . albuterol (VENTOLIN HFA) 108 (90 BASE) MCG/ACT inhaler Inhale 2 puffs into the lungs every 6 (six) hours as needed. For wheezing      . aspirin 81 MG tablet Take 81 mg by mouth daily.        [name] atorvastatin (LIPITOR) 20 MG tablet Take 1 tablet (20 mg total) by mouth daily.  90 tablet  3  . clopidogrel (PLAVIX) 75 MG tablet Take 1 tablet (75 mg total) by mouth daily.  90 tablet  3  . Dextromethorphan-Guaifenesin (MUCINEX FAST-MAX DM MAX) 5-100 MG/5ML LIQD Take 20 mg by mouth as needed.       . doxycycline (VIBRAMYCIN) 100 MG capsule Take 1 capsule (100 mg total) by mouth 2 (two) times daily.  20 capsule  0  . esomeprazole (NEXIUM) 40 MG capsule Take 1 capsule (40 mg total) by mouth daily before breakfast.  90 capsule  3  . fluticasone (FLONASE) 50 MCG/ACT nasal spray Place 2 sprays into the nose daily.      . Fluticasone-Salmeterol (ADVAIR DISKUS) 500-50 MCG/DOSE AEPB Inhale 1 puff into the lungs every 12 (twelve) hours.        . Hypertonic Nasal Wash (SINUS RINSE NA) Place 1 Package into the nose as needed.       . levalbuterol (XOPENEX) 0.63 MG/3ML nebulizer solution Take 1 ampule by nebulization every 4 (four) hours as needed.        [name] levofloxacin (LEVAQUIN) 750 MG tablet Take 1 tablet (750 mg total) by mouth daily.  7 tablet  0  . loratadine (CLARITIN) 10 MG tablet Take 10 mg by mouth daily as needed.       [name] LORazepam (ATIVAN) 0.5 MG tablet Take 1 tablet (0.5 mg total) by mouth at bedtime.  30 tablet  5  . losartan (COZAAR) 25 MG tablet Take 1 tablet (25 mg  total) by mouth daily.  30 tablet  6  . nitroGLYCERIN (NITROSTAT) 0.4 MG SL tablet Place 1 tablet (0.4 mg total) under the tongue every 5 (five) minutes as needed.  25 tablet  3  . nystatin-triamcinolone ointment (MYCOLOG) Apply topically 3 (three) times daily.  60 g  0  . predniSONE (DELTASONE) 20 MG tablet Two tablets daily x 5 days then one tablet daily x 5 days  15 tablet  0  . protriptyline (VIVACTIL) 5 MG tablet Take 1 tablet (5 mg total) by mouth at bedtime.  30 tablet  5  . Pseudoephedrine-DM-GG (ROBITUSSIN COLD &  COUGH PO) As directed per bottle when needed for cough      . sertraline (ZOLOFT) 50 MG tablet Take 2  tablets daily.      . sucralfate (CARAFATE) 1 G tablet Take 1 tablet (1 g total) by mouth 4 (four) times daily.  360 tablet  1  . tiotropium (SPIRIVA) 18 MCG inhalation capsule Place 18 mcg into inhaler and inhale daily.        . vitamin C (ASCORBIC ACID) 500 MG tablet Take 500 mg by mouth daily.       Current Facility-Administered Medications on File Prior to Visit  Medication Dose Route Frequency Provider Last Rate Last Dose  . albuterol (PROVENTIL) (2.5 MG/3ML) 0.083% nebulizer solution 2.5 mg  2.5 mg Nebulization Once [name], MD      . albuterol (PROVENTIL) (2.5 MG/3ML) 0.083% nebulizer solution 2.5 mg  2.5 mg Nebulization Once [name], MD           Objective:   Physical Exam  Nursing note and vitals reviewed. Constitutional: He is oriented to person, place, and time. He appears well-developed and well-nourished. No distress.  HENT:  Head: Normocephalic and atraumatic.  Mouth/Throat: Oropharynx is clear and moist.  Eyes: Conjunctivae and EOM are normal. Pupils are equal, round, and reactive to light.  Neck: Normal range of motion. Neck supple. No thyromegaly present.  Cardiovascular: Normal rate, regular rhythm and normal heart sounds.   Pulmonary/Chest: Effort normal. He has wheezes in the right lower field and the left lower field. He has no rales.    Distant breath sounds throughout.  Abdominal: Soft. Bowel sounds are normal. He exhibits no distension and no mass. There is no tenderness. There is no rebound and no guarding.  Mild diffuse generalized TTP throughout.  Lymphadenopathy:    He has no cervical adenopathy.  Neurological: He is alert and oriented to person, place, and time.  Skin: Skin is warm and dry. No rash noted. He is not diaphoretic.  Psychiatric: He has a normal mood and affect. His behavior is normal.   Results for orders placed in visit on 09/27/13  POCT CBC      Result Value Range   WBC 6.6  4.6 - 10.2 K/uL   Lymph, poc 1.5  0.6 - 3.4   POC LYMPH PERCENT 22.7  10 - 50 %L   MID (cbc) 0.5  0 - 0.9   POC MID % 8.2  0 - 12 %M   POC Granulocyte 4.6  2 - 6.9   Granulocyte percent 69.1  37 - 80 %G   RBC 4.14 (*) 4.69 - 6.13 M/uL   Hemoglobin 12.2 (*) 14.1 - 18.1 g/dL   HCT, POC 57.8 (*) 46.9 - 53.7 %   MCV 94.1  80 - 97 fL   MCH, POC 29.5  27 - 31.2 pg   MCHC 31.3 (*) 31.8 - 35.4 g/dL   RDW, POC 62.9     Platelet Count, POC 185  142 - 424 K/uL   MPV 8.2  0 - 99.8 fL   EKG: NSR; ST changes with inversion V4-6.  [facility] reading (PRIMARY) by  Dr. Katrinka Blazing.  CXR:  Hyperinflation; NAD; +calcification R axillary region.      Assessment & Plan:  Chest pain - Plan: DG Chest 2 View, EKG 12-Lead, POCT CBC, Comprehensive metabolic panel, albuterol (PROVENTIL) (5 MG/ML) 0.5% nebulizer solution 2.5 mg, ipratropium (ATROVENT) nebulizer solution 0.5 mg  Abdominal pain, epigastric - Plan: DG Chest  2 View, EKG 12-Lead, POCT CBC, Comprehensive metabolic panel, albuterol (PROVENTIL) (5 MG/ML) 0.5% nebulizer solution 2.5 mg, ipratropium (ATROVENT) nebulizer solution 0.5 mg, IFOBT POC (occult bld, rslt in office)  SOB (shortness of breath) - Plan: DG Chest 2 View, EKG 12-Lead, POCT CBC, Comprehensive metabolic panel, albuterol (PROVENTIL) (5 MG/ML) 0.5% nebulizer solution 2.5 mg, ipratropium (ATROVENT) nebulizer solution 0.5  mg  Headache(784.0) - Plan: DG Chest 2 View, EKG 12-Lead, POCT CBC, Comprehensive metabolic panel, albuterol (PROVENTIL) (5 MG/ML) 0.5% nebulizer solution 2.5 mg, ipratropium (ATROVENT) nebulizer solution 0.5 mg  Acute bronchitis - Plan: doxycycline (VIBRAMYCIN) 100 MG capsule  Anemia   1.  Chest pain: Persistent with worsening over the past two weeks; constant; radiating from abdomen.  Consistent with GI etiology yet known CAD.  S/p NTG x 3 today without improvement; will forward note to Dr. Kirke Corin for review.   2.  Abdominal pain epigastric:  Worsening; with radiation into chest, DOE.  New onset anemia.  Hemoccult +; concern for ulcer.  Refer to Dr. Jarold Motto of GI for EGD.  Stop Carafate; increase Nexium to bid. Bland diet. 3.  SOB/DOE: worsening; stable CXR; s/p Albuterol/Atrovent nebulizer in office; stable pulse oximetry.  Mild sputum. 4.  Acute bronchitis: New. Mild; rx for Doxy provided. 5. COPD: worsening; s/p Albuterol/Atrovent nebulizer in office; increase Albuterol to qid for the next week and then decrease to PRN. 6. Anemia:  New onset.  Hemoccult +; close follow-up to repeat H/H.  Meds ordered this encounter  Medications  . albuterol (PROVENTIL) (5 MG/ML) 0.5% nebulizer solution 2.5 mg    Sig:   . ipratropium (ATROVENT) nebulizer solution 0.5 mg    Sig:   . doxycycline (VIBRAMYCIN) 100 MG capsule    Sig: Take 1 capsule (100 mg total) by mouth 2 (two) times daily.    Dispense:  20 capsule    Refill:  0

## 2013-09-27 NOTE — Patient Instructions (Signed)
1.  INCREASE NEXIUM 40MG  TO ONE PILL TWICE DAILY. 2.  I WILL SCHEDULE YOU TO SEE DR. PATTERSON IN [place] 1-3 WEEKS. 3.  I WILL ALSO SEND DR. ARIDA A MESSAGE AND TO REVIEW YOUR EKG.

## 2013-09-27 NOTE — Telephone Encounter (Signed)
Spoke w/ pt.  States that he has had "dull ache, not pain, more like a burning sensation for several days". States that it has radiated into his left arm "about down to his elbow". Pt has nitro, but has not taken it, as he is "scared of it" b/c the last time he took it, he explains that he "fell out and had to lay down". Explained purpose, effects, and side effects of nitro to pt.   He states that he recently saw his PCP for "overdoing it" and was instructed not to overexert himself. Pt admits that he is "stubborn and didn't listen" and has not slowed down. He reports that the pain may be muscular, but he wants to make sure.   Has an appt w/ PCP this afternoon at 4:00. Pt states that he is with his family at the moment, he will tell them that he is going to lie down, take a nitro and see if the pain goes away. Instructed pt on taking nitro and when to call 911. Pt states that he believes that it is muscle pain, but wanted to let us know "just in case". He plans to keep his appt at 4:00 "unless I'm in the hospital".

## 2013-09-27 NOTE — Telephone Encounter (Signed)
Having left arm discomfort. Please advise

## 2013-09-28 LAB — COMPREHENSIVE METABOLIC PANEL
ALT: 13 U/L (ref 0–53)
Albumin: 4 g/dL (ref 3.5–5.2)
CO2: 31 mEq/L (ref 19–32)
Glucose, Bld: 104 mg/dL — ABNORMAL HIGH (ref 70–99)
Potassium: 4.5 mEq/L (ref 3.5–5.3)
Sodium: 138 mEq/L (ref 135–145)
Total Bilirubin: 0.4 mg/dL (ref 0.3–1.2)
Total Protein: 6 g/dL (ref 6.0–8.3)

## 2013-10-16 ENCOUNTER — Ambulatory Visit (INDEPENDENT_AMBULATORY_CARE_PROVIDER_SITE_OTHER): Payer: BC Managed Care – PPO | Admitting: Gastroenterology

## 2013-10-16 ENCOUNTER — Encounter: Payer: Self-pay | Admitting: Gastroenterology

## 2013-10-16 VITALS — BP 120/62 | HR 94 | Ht 69.5 in | Wt 161.0 lb

## 2013-10-16 DIAGNOSIS — J441 Chronic obstructive pulmonary disease with (acute) exacerbation: Secondary | ICD-10-CM

## 2013-10-16 DIAGNOSIS — R079 Chest pain, unspecified: Secondary | ICD-10-CM

## 2013-10-16 DIAGNOSIS — I7090 Unspecified atherosclerosis: Secondary | ICD-10-CM

## 2013-10-16 DIAGNOSIS — Z7901 Long term (current) use of anticoagulants: Secondary | ICD-10-CM

## 2013-10-16 DIAGNOSIS — I2510 Atherosclerotic heart disease of native coronary artery without angina pectoris: Secondary | ICD-10-CM

## 2013-10-16 MED ORDER — SUCRALFATE 1 GM/10ML PO SUSP: 1.0000 g | Freq: Four times a day (QID) | ORAL | Status: DC

## 2013-10-16 NOTE — Patient Instructions (Addendum)
We have sent the following medications to your pharmacy for you to pick up at your convenience: Carafate, please take as directed  You have an appointment with Dr. Jerolyn Center on Thursday October 18, 2013 at 1145 am If pain or tightness in chest gets worse then you need to go to the Emergency Room Department immediatly

## 2013-10-16 NOTE — Progress Notes (Signed)
This is a extremely old Caucasian male smoker with severe COPD and severe coronary artery disease who has had coronary artery stenting and is on when necessary nitroglycerin and multiple pulmonary and cardiac medications including Clopidrogel.  He's had atypical chest pain on and off for over a year worse over the last week.  One year ago I did an endoscopy on him which was entirely normal.  He's been on omeprazole 40 mg twice a day for the last week with no improvement in what he described as a constant substernal pain with associated shortness of breath and dyspnea on exertion.  He apparently saw his primary care physician and she told him that he had a normal cardiogram.  He is followed by labile a cardiology.  He denies current cough, sputum production, or hemoptysis.  He denies true reflux symptoms and has no dysphagia or hepatobiliary complaints.  Current Medications, Allergies, Past Medical History, Past Surgical History, Family History and Social History were reviewed in [employer] record.  ROS: All systems were reviewed and are negative unless otherwise stated in the HPI.          Physical Exam: Patient with severe COPD who has shortness of breath at rest. Pressure 120/62, pulse 94 and regular and weight 161.  He has wheezes and rhonchi in both lung fields.  I cannot hear his heart sounds very well but cannot appreciate any definite murmurs gallops or rubs.  His abdomen is somewhat distended but there is no organomegaly, masses or tenderness.  Bowel sounds are normal.  Mental status is normal    Assessment and Plan: I'm concerned this patient's chest pain is related to his coronary artery disease, and is unlikely that it is related to acid reflux with a normal endoscopy and no response to twice a day PPI therapy.  I've suggested to the patient that he go to the emergency room for evaluation, and he has refused.  I have set him up to see a [place] cardiologist ASAP,  have expressed my concern about his condition , and empirically placed him on Carafate suspension 1 tablespoon every 2 hours while continuing his above-mentioned medications.  I suspect he will need hospitalization repeat catheterization.  He has severe COPD which also needs followup per his pulmonologist.  I see no need for repeat endoscopy in this patient, and in any case he is not a candidate for this procedure with such severe COPD.  As mentioned above endoscopy was done within the last year as was colonoscopy.  CBC and metabolic profile are normal in October 9 of this year.  Chest x-ray is consistent with severe emphysema.  Gallbladder ultrasound was negative in September of 2013.

## 2013-10-18 ENCOUNTER — Ambulatory Visit (INDEPENDENT_AMBULATORY_CARE_PROVIDER_SITE_OTHER): Payer: BC Managed Care – PPO | Admitting: Cardiovascular Disease

## 2013-10-18 ENCOUNTER — Encounter: Payer: Self-pay | Admitting: Cardiovascular Disease

## 2013-10-18 VITALS — BP 116/74 | HR 81 | Ht 70.0 in | Wt 159.0 lb

## 2013-10-18 DIAGNOSIS — I428 Other cardiomyopathies: Secondary | ICD-10-CM

## 2013-10-18 DIAGNOSIS — R0789 Other chest pain: Secondary | ICD-10-CM

## 2013-10-18 DIAGNOSIS — I2510 Atherosclerotic heart disease of native coronary artery without angina pectoris: Secondary | ICD-10-CM

## 2013-10-18 NOTE — Assessment & Plan Note (Signed)
Most recent ejection fraction was 40%. Continue treatment with losartan. He did not tolerate Toprol due to worsening dyspnea and bradycardia.

## 2013-10-18 NOTE — Progress Notes (Signed)
Primary care physician: Dr. Nilda Simmer  HPI  This is a 62 year old male who is here today for a followup visit. He has known history of coronary artery disease status post anterior myocardial infarction in December of 2012. His LAD was occluded and RCA had a 99% stenosis. He had an angioplasty and drug-eluting stent placement to both LAD and RCA at that time. 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 He cut down to 10 cigarettes a day.  He had worsening exertional dyspnea and chest pain early this year. Thus, I decided to proceed with cardiac catheterization in 02/14  which showed widely patent stents in the LAD and RCA with no evidence of other obstructive disease. Ejection fraction was 40% with mildly elevated left ventricular end-diastolic pressure. Unfortunately, he continues to smoke. He did not tolerate beta blockers due to worsening dyspnea.  He started having epigastric and lower chest in sensation over the last few weeks which has been intermittent. It can happen at rest and with physical activities. He complains of dyspnea with minimal activities which has been a chronic issue related to her COPD. EKG did not show new changes. We felt that some of his symptoms might be GI in nature. He was seen by Dr. Jarold Motto who was concerned that her symptoms were probably not related to a GI etiology. He had an endoscopy done last year which was normal. His symptoms have not improved with the PPI. Current symptoms are similar to his presentation with myocardial infarction according to the patient.  Allergies  Allergen Reactions  . Wellbutrin [Bupropion] Other (See Comments)    Unknown/"made me feel real funny"     Current Outpatient Prescriptions on File Prior to Visit  Medication Sig Dispense Refill  . albuterol (VENTOLIN HFA) 108 (90 BASE) MCG/ACT inhaler Inhale 2 puffs into the lungs every 6 (six) hours as needed. For wheezing      . aspirin 81 MG tablet Take 81 mg by  mouth daily.        [name] atorvastatin (LIPITOR) 20 MG tablet Take 1 tablet (20 mg total) by mouth daily.  90 tablet  3  . clopidogrel (PLAVIX) 75 MG tablet Take 1 tablet (75 mg total) by mouth daily.  90 tablet  3  . losartan (COZAAR) 25 MG tablet Take 1 tablet (25 mg total) by mouth daily.  30 tablet  6  . nitroGLYCERIN (NITROSTAT) 0.4 MG SL tablet Place 1 tablet (0.4 mg total) under the tongue every 5 (five) minutes as needed.  25 tablet  3  . Pseudoephedrine-DM-GG (ROBITUSSIN COLD & COUGH PO) As directed per bottle when needed for cough      . sucralfate (CARAFATE) 1 GM/10ML suspension Take 10 mLs (1 g total) by mouth 4 (four) times daily.  420 mL  3  . tiotropium (SPIRIVA) 18 MCG inhalation capsule Place 18 mcg into inhaler and inhale daily.         Current Facility-Administered Medications on File Prior to Visit  Medication Dose Route Frequency Provider Last Rate Last Dose  . albuterol (PROVENTIL) (2.5 MG/3ML) 0.083% nebulizer solution 2.5 mg  2.5 mg Nebulization Once [name], MD      . albuterol (PROVENTIL) (2.5 MG/3ML) 0.083% nebulizer solution 2.5 mg  2.5 mg Nebulization Once [name], MD         Past Medical History  Diagnosis Date  . Hyperlipidemia   . COPD (chronic obstructive pulmonary disease)   . Depression   .  GERD (gastroesophageal reflux disease)   . Colon polyps 08/2012    colonoscopy; multiple colon polyps; repeat colonoscopy in one year.  . Status post dilation of esophageal narrowing 2000  . Peptic ulcer   . Hypertension   . CARDIOMYOPATHY 03/24/2010    did not tolerate Toprol and Lisinopril.   . Insomnia   . Hemorrhoids   . MI (myocardial infarction)   . Atherosclerosis   . Anxiety   . Headache(784.0)   . Tinea pedis   . Myalgia   . Helicobacter pylori (H. pylori)   . Thyroid dysfunction   . Chest pain   . Back pain   . Hernia     inguinal  . Allergic rhinitis   . Bronchitis     09-14-11  . Chronic airway obstruction, not elsewhere classified    . Coronary artery disease 11/2009    Anterior MI with late presentation 11/2009. Cath showed a 100% proximal LAD occlusion, 99% mid RCA. Had a PCI and 2 DES to both LAD and RCA. EF was 35%. Nuclear stress test 05/13: prior anterior/inferior infarcts without ischemic, EF 43%, cardiac cath in 02/14: Widely patent stents with no other obstructive disease. EF 40%      Past Surgical History  Procedure Laterality Date  . Penile prosthesis implant    . Esophagogastroduodenoscopy  2008  . Colonoscopy    . Esophageal dilation    . Hernia repair  07/20/2012    L inguinal hernia repair  . Admission  12/20/2012    COPD exacerbation.  [facility].  . Esophagogastroduodenoscopy  08/20/2012  . Cardiac catheterization    . Coronary angioplasty with stent placement  09/2009    LAD 3.0 X23 mm Xience DES, RCA: 4.0 X 15 mm Xience DES  . Cardiac catheterization  02/05/13    [facility]     Family History  Problem Relation Age of Onset  . Heart attack Brother     Brother #1  . Diabetes Brother     brother #2  . Hypertension Brother     #3  . Coronary artery disease Father 41    deceased  . COPD Mother 38    deceased  . Heart attack Sister 73    acute-cocaine abuse/deceased  . Heart attack Father   . Diabetes Father      History   Social History  . Marital Status: Married    Spouse Name: N/A    Number of Children: 5  . 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 Smoking status: Current Every Day Smoker -- 2.00 packs/day for 42 years    Types: Cigarettes  . Smokeless tobacco: Never Used  . Alcohol Use: No  . Drug Use: No  . Sexual Activity: Yes   Other Topics Concern  . Not on file   Social History Narrative   Marital status: married x 31 years; happily married.      Children: 4 children;  1 stepchild and 15 grandchildren; no great grandchildren.      Lives: with wife; 2 dogs, 1 cat.      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      Tobacco: 1-2 ppd x 45 years      Alcohol: none      Drugs: none      Exercise: no formal exercise; physically demanding job; owns horses.      Seatbelts:  100%      Guns: one loaded unsecured gun in the home.      Sunscreen: none      PHYSICAL EXAM   Ht 5\' 10"  (1.778 m)  Wt 159 lb (72.122 kg)  BMI 22.81 kg/m2 Constitutional: He is oriented to person, place, and time. He appears well-developed and well-nourished. No distress.  HENT: No nasal discharge.  Head: Normocephalic and atraumatic.  Eyes: Pupils are equal and round. Right eye exhibits no discharge. Left eye exhibits no discharge.  Neck: Normal range of motion. Neck supple. No JVD present. No thyromegaly present.  Cardiovascular: Normal rate, regular rhythm, normal heart sounds and. Exam reveals no gallop and no friction rub. No murmur heard.  Pulmonary/Chest: Effort normal and diminished breath sounds. No stridor. No respiratory distress. He has no wheezes. He has no rales. He exhibits no tenderness.  Abdominal: Soft. Bowel sounds are normal. He exhibits no distension. There is no tenderness. There is no rebound and no guarding.  Musculoskeletal: Normal range of motion. He exhibits no edema and no tenderness.  Neurological: He is alert and oriented to person, place, and time. Coordination normal.  Skin: Skin is warm and dry. No rash noted. He is not diaphoretic. No erythema. No pallor.  Psychiatric: He has a normal mood and affect. His behavior is normal. Judgment and thought content normal.       EKG: Sinus  Rhythm  Incomplete right bundle branch block  -Old anterior infarct.  Nonspecific ST changes. ABNORMAL    ASSESSMENT AND PLAN

## 2013-10-18 NOTE — Patient Instructions (Addendum)
Your physician has requested that you have a cardiac catheterization. Cardiac catheterization is used to diagnose and/or treat various heart conditions. Doctors may recommend this procedure for a number of different reasons. The most common reason is to evaluate chest pain. Chest pain can be a symptom of coronary artery disease (CAD), and cardiac catheterization can show whether plaque is narrowing or blocking your heart's arteries. This procedure is also used to evaluate the valves, as well as measure the blood flow and oxygen levels in different parts of your heart. For further information please visit [email or web address] Please follow instruction sheet, as given.  [facility] Cardiac Cath Instructions   You are scheduled for a Cardiac Cath on:_____Friday 10/19/13 at 11:30 am___with Dr. Arida_________________  Please arrive at __10:30_____am on the day of your procedure  You will need to pre-register prior to the day of your procedure.  Enter through the [employer] at [facility].  Registration is the first desk on your right.  Please take the procedure order we have given you in order to be registered appropriately  Do not eat/drink anything after midnight  Someone will need to drive you home  It is recommended someone be with you for the first 24 hours after your procedure  Wear clothes that are easy to get on/off and wear slip on shoes if possible   Medications bring a current list of all medications with you  _x__ You may take all of your medications the morning of your procedure with enough water to swallow safely  ___ Do not take these medications before your procedure:_______________________________________________________________________________________________________________________________________________________________________________________________________________   Day of your procedure: [facility]rive at the [facility] entrance.  Free valet service is available.  After entering the  [facility] please check-in at the registration desk (1st desk on your right) to receive your armband. After receiving your armband someone will escort you to the cardiac cath/special procedures waiting area.  The usual length of stay after your procedure is about 2 to 3 hours.  This can vary.  If you have any questions, please call our office at [phone], or you may call the cardiac cath lab at [facility] directly at [phone]

## 2013-10-18 NOTE — Assessment & Plan Note (Signed)
The patient is having recurrent chest pain with known history of coronary artery disease. Although his current symptoms might be GI in nature, he reports that they are similar to when he presented with myocardial infarction. Also he has not responded to aggressive treatment of reflux. Dr. Jarold Motto reports a normal EGD within the last year. Based on the above, I agree that he needs further ischemic cardiac evaluation. Due to previous anterior scar, a stress test will likely be of limited diagnostic utility. I recommend proceeding with cardiac catheterization and possible coronary intervention. Risks, benefits and alternatives were discussed. Continue current medications.

## 2013-10-19 ENCOUNTER — Encounter: Payer: Self-pay | Admitting: Cardiovascular Disease

## 2013-10-19 ENCOUNTER — Ambulatory Visit: Payer: Self-pay | Admitting: Cardiovascular Disease

## 2013-10-19 DIAGNOSIS — I2510 Atherosclerotic heart disease of native coronary artery without angina pectoris: Secondary | ICD-10-CM

## 2013-10-19 LAB — CBC WITH DIFFERENTIAL/PLATELET
Basophils Absolute: 0 10*3/uL (ref 0.0–0.2)
Basos: 0 %
Eosinophils Absolute: 0.1 10*3/uL (ref 0.0–0.4)
Immature Granulocytes: 0 %
Lymphs: 15 %
MCH: 29.3 pg (ref 26.6–33.0)
MCHC: 34.3 g/dL (ref 31.5–35.7)
MCV: 86 fL (ref 79–97)
Monocytes Absolute: 0.7 10*3/uL (ref 0.1–0.9)
Neutrophils Absolute: 5.2 10*3/uL (ref 1.4–7.0)
Neutrophils Relative %: 75 %
RDW: 12.8 % (ref 12.3–15.4)

## 2013-10-19 LAB — BASIC METABOLIC PANEL
BUN/Creatinine Ratio: 10 (ref 10–22)
BUN: 8 mg/dL (ref 8–27)
CO2: 24 mmol/L (ref 18–29)
Calcium: 9.3 mg/dL (ref 8.6–10.2)
Chloride: 99 mmol/L (ref 97–108)
Creatinine, Ser: 0.82 mg/dL (ref 0.76–1.27)
GFR calc Af Amer: 111 mL/min/{1.73_m2} (ref >59)
GFR calc non Af Amer: 96 mL/min/{1.73_m2} (ref >59)
Glucose: 96 mg/dL (ref 65–99)
Potassium: 4.2 mmol/L (ref 3.5–5.2)
Sodium: 140 mmol/L (ref 134–144)

## 2013-10-19 LAB — PROTIME-INR: INR: 1 (ref 0.8–1.2)

## 2013-10-24 ENCOUNTER — Other Ambulatory Visit: Payer: Self-pay

## 2013-10-24 ENCOUNTER — Encounter: Payer: Self-pay | Admitting: Family Medicine

## 2013-10-24 ENCOUNTER — Ambulatory Visit (INDEPENDENT_AMBULATORY_CARE_PROVIDER_SITE_OTHER): Payer: BC Managed Care – PPO | Admitting: Family Medicine

## 2013-10-24 ENCOUNTER — Telehealth: Payer: Self-pay | Admitting: *Deleted

## 2013-10-24 VITALS — BP 128/72 | HR 75 | Temp 97.9°F | Resp 16 | Ht 68.0 in | Wt 160.2 lb

## 2013-10-24 DIAGNOSIS — J441 Chronic obstructive pulmonary disease with (acute) exacerbation: Secondary | ICD-10-CM

## 2013-10-24 DIAGNOSIS — D649 Anemia, unspecified: Secondary | ICD-10-CM

## 2013-10-24 DIAGNOSIS — J209 Acute bronchitis, unspecified: Secondary | ICD-10-CM

## 2013-10-24 DIAGNOSIS — R1013 Epigastric pain: Secondary | ICD-10-CM

## 2013-10-24 DIAGNOSIS — R079 Chest pain, unspecified: Secondary | ICD-10-CM

## 2013-10-24 DIAGNOSIS — Z23 Encounter for immunization: Secondary | ICD-10-CM

## 2013-10-24 MED ORDER — FLUTICASONE-SALMETEROL 500-50 MCG/DOSE IN AEPB: 1.0000 | INHALATION_SPRAY | Freq: Two times a day (BID) | RESPIRATORY_TRACT | Status: DC

## 2013-10-24 MED ORDER — LORAZEPAM 0.5 MG PO TABS: 0.5000 mg | ORAL_TABLET | ORAL | Status: DC | PRN

## 2013-10-24 MED ORDER — FLUTICASONE PROPIONATE 50 MCG/ACT NA SUSP: 2.0000 | Freq: Every day | NASAL | Status: DC

## 2013-10-24 MED ORDER — PREDNISONE 20 MG PO TABS: ORAL_TABLET | ORAL | Status: DC

## 2013-10-24 MED ORDER — FLUTICASONE-SALMETEROL 500-50 MCG/DOSE IN AEPB: 2.0000 | INHALATION_SPRAY | Freq: Every day | RESPIRATORY_TRACT | Status: DC

## 2013-10-24 MED ORDER — LEVALBUTEROL HCL 1.25 MG/3ML IN NEBU: 1.2500 mg | INHALATION_SOLUTION | Freq: Three times a day (TID) | RESPIRATORY_TRACT | Status: DC

## 2013-10-24 MED ORDER — ALBUTEROL SULFATE HFA 108 (90 BASE) MCG/ACT IN AERS: 2.0000 | INHALATION_SPRAY | Freq: Four times a day (QID) | RESPIRATORY_TRACT | Status: DC | PRN

## 2013-10-24 MED ORDER — ESOMEPRAZOLE MAGNESIUM 40 MG PO CPDR: 80.0000 mg | DELAYED_RELEASE_CAPSULE | Freq: Two times a day (BID) | ORAL | Status: DC

## 2013-10-24 MED ORDER — LEVOFLOXACIN 750 MG PO TABS: 750.0000 mg | ORAL_TABLET | Freq: Every day | ORAL | Status: DC

## 2013-10-24 MED ORDER — TIOTROPIUM BROMIDE MONOHYDRATE 18 MCG IN CAPS: 18.0000 ug | ORAL_CAPSULE | Freq: Every day | RESPIRATORY_TRACT | Status: DC

## 2013-10-24 NOTE — Progress Notes (Signed)
[address]   [place], [place]  [address]   [phone]  Subjective:    Patient ID: Leslie Sims, male    DOB: 05/20/1951, 62 y.o.   MRN: [id number]  Cough Associated symptoms include rhinorrhea and wheezing. Pertinent negatives include no chest pain, chills, ear pain, fever, headaches, rash or sore throat.   This 62 y.o. male presents for three week follow-up:  1.  Congestion/cough:  Onset one week ago.  No fever/chills but +sweats.  No headache.  No ear pain or sore throat.  Mild rhinorrhea; no nasal congestion; some sneezing; no itchy eyes or nose.  +sputum white and thick.  +coughing; +SOB; +wheezing.  Taking inhalers, Mucinex, Albuterol 4-5 times per day.  Nebulizer atleast twice daily.  Has been out of Spiriva for one week; should be in the mail.  Started Flonase yesterday.  No antihistamine.  Pt very frustrated by recurrent congestion and breathing issues.  No chemical or environmental exposures in past week.  S/p consultation by Dr. Fannie Knee; no changes in management made; discussed allergy shots but not initiated; no follow up scheduled.  Patient asking what can be done to improve breathing.  2.  Flu vaccine: requesting.    3.  Chest pain/epigastric pain: s/p consultation by Dr. Jarold Motto who was concerned about cardiac etiology of symptoms; recommended follow-up with Dr. Kirke Corin; Dr. Jarold Motto did switch Carafate to liquid.  Chest and epigastric pain has improved.  S/p evaluation by Dr. Kirke Corin; scheduled cardiac catheterization that was overall stable without acute occlusive disease.  Catheterization was five days ago; will follow-up with Arida in two weeks.  No follow-up with Jarold Motto of GI.    4.  Anemia: with hemoccult + stool at last visit; Dr. Kirke Corin repeated labs on 10/18/13; Hgb was 14.9.  No bloody stools or melanotic stools.     Review of Systems  Constitutional: Positive for diaphoresis and fatigue. Negative for fever and chills.  HENT: Positive for rhinorrhea.  Negative for congestion, ear pain, sore throat, trouble swallowing and voice change.   Respiratory: Positive for cough and wheezing. Negative for stridor.   Cardiovascular: Negative for chest pain, palpitations and leg swelling.  Gastrointestinal: Positive for abdominal pain. Negative for nausea, vomiting, diarrhea and constipation.  Skin: Negative for rash.  Neurological: Negative for dizziness, syncope, numbness and headaches.  Psychiatric/Behavioral: Negative for sleep disturbance and dysphoric mood. The patient is not nervous/anxious.    Past Medical History  Diagnosis Date  . Hyperlipidemia   . COPD (chronic obstructive pulmonary disease)   . Depression   . GERD (gastroesophageal reflux disease)   . Colon polyps 08/2012    colonoscopy; multiple colon polyps; repeat colonoscopy in one year.  . Status post dilation of esophageal narrowing 2000  . Peptic ulcer   . Hypertension   . CARDIOMYOPATHY 03/24/2010    did not tolerate Toprol and Lisinopril.   . Insomnia   . Hemorrhoids   . MI (myocardial infarction)   . Atherosclerosis   . Anxiety   . Headache(784.0)   . Tinea pedis   . Myalgia   . Helicobacter pylori (H. pylori)   . Thyroid dysfunction   . Chest pain   . Back pain   . Hernia     inguinal  . Allergic rhinitis   . Bronchitis     09-14-11  . Chronic airway obstruction, not elsewhere classified   . Coronary artery disease 11/2009    Anterior MI with late presentation 11/2009. Cath showed a  100% proximal LAD occlusion, 99% mid RCA. Had a PCI and 2 DES to both LAD and RCA. EF was 35%. Nuclear stress test 05/13: prior anterior/inferior infarcts without ischemic, EF 43%, cardiac cath in 02/14: Widely patent stents with no other obstructive disease. EF 40%    Past Surgical History  Procedure Laterality Date  . Penile prosthesis implant    . Esophagogastroduodenoscopy  2008  . Colonoscopy    . Esophageal dilation    . Hernia repair  07/20/2012    L inguinal hernia repair    . Admission  12/20/2012    COPD exacerbation.  [facility].  . Esophagogastroduodenoscopy  08/20/2012  . Cardiac catheterization    . Coronary angioplasty with stent placement  09/2009    LAD 3.0 X23 mm Xience DES, RCA: 4.0 X 15 mm Xience DES  . Cardiac catheterization  02/05/13    [facility]   Allergies  Allergen Reactions  . Wellbutrin [Bupropion] Other (See Comments)    Unknown/"made me feel real funny"   Current Outpatient Prescriptions on File Prior to Visit  Medication Sig Dispense Refill  . aspirin 81 MG tablet Take 81 mg by mouth daily.        [name] atorvastatin (LIPITOR) 20 MG tablet Take 1 tablet (20 mg total) by mouth daily.  90 tablet  3  . clopidogrel (PLAVIX) 75 MG tablet Take 1 tablet (75 mg total) by mouth daily.  90 tablet  3  . esomeprazole (NEXIUM) 40 MG capsule Take 80 mg by mouth daily before breakfast.      . nitroGLYCERIN (NITROSTAT) 0.4 MG SL tablet Place 1 tablet (0.4 mg total) under the tongue every 5 (five) minutes as needed.  25 tablet  3  . Pseudoephedrine-DM-GG (ROBITUSSIN COLD & COUGH PO) As directed per bottle when needed for cough      . sertraline (ZOLOFT) 100 MG tablet Take 100 mg by mouth daily.      . sucralfate (CARAFATE) 1 GM/10ML suspension Take 10 mLs (1 g total) by mouth 4 (four) times daily.  420 mL  3  . losartan (COZAAR) 25 MG tablet Take 1 tablet (25 mg total) by mouth daily.  30 tablet  6   Current Facility-Administered Medications on File Prior to Visit  Medication Dose Route Frequency Provider Last Rate Last Dose  . albuterol (PROVENTIL) (2.5 MG/3ML) 0.083% nebulizer solution 2.5 mg  2.5 mg Nebulization Once [name], MD      . albuterol (PROVENTIL) (2.5 MG/3ML) 0.083% nebulizer solution 2.5 mg  2.5 mg Nebulization Once [name], MD       History   Social History  . Marital Status: Married    Spouse Name: N/A    Number of Children: 5  . 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 Smoking status: Current Every Day Smoker -- 2.00 packs/day for 42 years    Types: Cigarettes  . Smokeless tobacco: Never Used  . Alcohol Use: No  . Drug Use: No  . Sexual Activity: Yes   Other Topics Concern  . Not on file   Social History Narrative   Marital status: married x 31 years; happily married.      Children: 4 children;  1 stepchild and 15 grandchildren; no great grandchildren.      Lives: with wife; 2 dogs, 1 cat.      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      Tobacco: 1-2 ppd x 45 years      Alcohol: none      Drugs: none      Exercise: no formal exercise; physically demanding job; owns horses.      Seatbelts:  100%      Guns: one loaded unsecured gun in the home.      Sunscreen: none       Objective:   Physical Exam  Nursing note and vitals reviewed. Constitutional: He is oriented to person, place, and time. He appears well-developed and well-nourished. No distress.  HENT:  Head: Normocephalic and atraumatic.  Right Ear: External ear normal.  Left Ear: External ear normal.  Nose: Nose normal.  Mouth/Throat: Oropharynx is clear and moist. No oropharyngeal exudate.  Eyes: Conjunctivae and EOM are normal. Pupils are equal, round, and reactive to light.  Neck: Normal range of motion. Neck supple.  Cardiovascular: Normal rate, regular rhythm and normal heart sounds.  Exam reveals no gallop and no friction rub.   No murmur heard. Pulmonary/Chest: No accessory muscle usage. Tachypnea noted. He is in respiratory distress. He has wheezes in the right upper field, the right middle field, the right lower field, the left upper field and the left middle field. He has no rhonchi. He has no rales.  Mild respiratory distress; speaking complete sentences; diffuse inspiratory and expiratory wheezing; decreased breath sounds and air movement.  No retractions or nasal flaring.  Lymphadenopathy:    He has no cervical adenopathy.  Neurological: He  is alert and oriented to person, place, and time.  Skin: No rash noted. He is not diaphoretic.  Psychiatric: He has a normal mood and affect. His behavior is normal.   INFLUENZA VACCINE ADMINISTERED IN OFFICE.    Assessment & Plan:  Acute bronchitis  COPD exacerbation  Anemia  Chest pain  Abdominal pain, epigastric  Need for prophylactic vaccination and inoculation against influenza  1. Acute bronchitis:  New. Rx for Levaquin provided.   2.  COPD exacerbation:  New.  Rx for Prednisone provided; rx for Xopenex nebulizer also provided; continue Advair and Spiriva; perform nebulizer tid scheduled for one week and then PRN; RTC for acute worsening. 3.  Anemia; improved/resolved; repeat labs with Arida/cardiology. 4.  Chest pain: improved; s/p extensive cardiac evaluation with cardiac cath; stable.  S/p GI consultation.   5. Abdominal pain epigastric: improved with switch to liquid Carafate. 6. S/p flu vaccine.  Meds ordered this encounter  Medications  . levalbuterol (XOPENEX) 1.25 MG/3ML nebulizer solution    Sig: Take 1.25 mg by nebulization 3 (three) times daily.    Dispense:  270 mL    Refill:  11  . LORazepam (ATIVAN) 0.5 MG tablet    Sig: Take 1 tablet (0.5 mg total) by mouth as needed.    Dispense:  30 tablet    Refill:  3  . Fluticasone-Salmeterol (ADVAIR) 500-50 MCG/DOSE AEPB    Sig: Inhale 1 puff into the lungs 2 (two) times daily.    Dispense:  180 each    Refill:  3  . albuterol (VENTOLIN HFA) 108 (90 BASE) MCG/ACT inhaler    Sig: Inhale 2 puffs into the lungs every 6 (six) hours as needed. For wheezing    Dispense:  54 g    Refill:  3  . fluticasone (FLONASE) 50 MCG/ACT nasal spray    Sig: Place 2 sprays into both nostrils daily.    Dispense:  48 g    Refill:  3  . tiotropium (SPIRIVA) 18 MCG inhalation capsule    Sig: Place 1 capsule (18 mcg total) into inhaler and inhale daily.    Dispense:  90 capsule    Refill:  3  . DISCONTD: predniSONE (DELTASONE) 20  MG tablet    Sig: Three tablets daily x 1 day then two tablets daily x 5 days then one tablet daily x 5 days    Dispense:  18 tablet    Refill:  0  . DISCONTD: levofloxacin (LEVAQUIN) 750 MG tablet    Sig: Take 1 tablet (750 mg total) by mouth daily.    Dispense:  10 tablet    Refill:  0  . DISCONTD: esomeprazole (NEXIUM) 40 MG capsule    Sig: Take 2 capsules (80 mg total) by mouth 2 (two) times daily before a meal.    Dispense:  180 capsule    Refill:  3   Nilda Simmer, M.D.  [facility] [address] [place], [place]  [address] [phone] phone [phone] fax

## 2013-10-26 ENCOUNTER — Telehealth: Payer: Self-pay | Admitting: Radiology

## 2013-10-26 NOTE — Telephone Encounter (Signed)
Ref # [id number] [facility] wants to clarify sig on Nexium, 2 bid is above max dosing, please clarify and we can resend the Rx with the reference number they have provided. Amy

## 2013-10-29 MED ORDER — ESOMEPRAZOLE MAGNESIUM 40 MG PO CPDR: 40.0000 mg | DELAYED_RELEASE_CAPSULE | Freq: Two times a day (BID) | ORAL | Status: DC

## 2013-10-29 NOTE — Telephone Encounter (Signed)
Error on Rx; should have been Nexium 40mg  one tablet bid; I have sent in new rx.

## 2013-10-29 NOTE — Telephone Encounter (Signed)
Exp scripts called to check and I advised them that Dr Katrinka Blazing had just sent in a new corrected Rx and gave pharmacist correct sig.

## 2013-11-05 ENCOUNTER — Encounter: Payer: Self-pay | Admitting: Cardiovascular Disease

## 2013-11-05 ENCOUNTER — Ambulatory Visit (INDEPENDENT_AMBULATORY_CARE_PROVIDER_SITE_OTHER): Payer: BC Managed Care – PPO | Admitting: Cardiovascular Disease

## 2013-11-05 VITALS — BP 135/80 | HR 67 | Ht 70.0 in | Wt 163.2 lb

## 2013-11-05 DIAGNOSIS — I5022 Chronic systolic (congestive) heart failure: Secondary | ICD-10-CM

## 2013-11-05 DIAGNOSIS — I428 Other cardiomyopathies: Secondary | ICD-10-CM

## 2013-11-05 DIAGNOSIS — I10 Essential (primary) hypertension: Secondary | ICD-10-CM

## 2013-11-05 DIAGNOSIS — F17200 Nicotine dependence, unspecified, uncomplicated: Secondary | ICD-10-CM

## 2013-11-05 DIAGNOSIS — R0789 Other chest pain: Secondary | ICD-10-CM

## 2013-11-05 DIAGNOSIS — I2510 Atherosclerotic heart disease of native coronary artery without angina pectoris: Secondary | ICD-10-CM

## 2013-11-05 DIAGNOSIS — E785 Hyperlipidemia, unspecified: Secondary | ICD-10-CM

## 2013-11-05 MED ORDER — LOSARTAN POTASSIUM 25 MG PO TABS: 25.0000 mg | ORAL_TABLET | Freq: Every day | ORAL | Status: DC

## 2013-11-05 NOTE — Patient Instructions (Signed)
Continue same medications.   Your physician wants you to follow-up in: 6 months.  You will receive a reminder letter in the mail two months in advance. If you don't receive a letter, please call our office to schedule the follow-up appointment.  

## 2013-11-05 NOTE — Progress Notes (Signed)
Primary care physician: Dr. Nilda Simmer  HPI  This is a 62 year old male who is here today for a followup visit. He has known history of coronary artery disease status post anterior myocardial infarction in December of 2012. His LAD was occluded and RCA had a 99% stenosis. He had an angioplasty and drug-eluting stent placement to both LAD and RCA at that time. 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 He is trying to quit.  He had worsening exertional dyspnea and chest pain early this year. Thus, I decided to proceed with cardiac catheterization in 02/14  which showed widely patent stents in the LAD and RCA with no evidence of other obstructive disease. Ejection fraction was 40% with mildly elevated left ventricular end-diastolic pressure. He did not tolerate beta blockers due to worsening dyspnea.  He had recent epigastric and lower chest in sensation . EKG did not show new changes.  His symptoms did not improved with the PPI. Symptoms were similar to his presentation with myocardial infarction. I proceeded with cardiac catheterization which showed no significant change in coronary anatomy. He is feeling better. Symptoms improved with Carafate.     Allergies  Allergen Reactions  . Wellbutrin [Bupropion] Other (See Comments)    Unknown/"made me feel real funny"     Current Outpatient Prescriptions on File Prior to Visit  Medication Sig Dispense Refill  . albuterol (VENTOLIN HFA) 108 (90 BASE) MCG/ACT inhaler Inhale 2 puffs into the lungs every 6 (six) hours as needed. For wheezing  54 g  3  . aspirin 81 MG tablet Take 81 mg by mouth daily.        [name] atorvastatin (LIPITOR) 20 MG tablet Take 1 tablet (20 mg total) by mouth daily.  90 tablet  3  . clopidogrel (PLAVIX) 75 MG tablet Take 1 tablet (75 mg total) by mouth daily.  90 tablet  3  . esomeprazole (NEXIUM) 40 MG capsule Take 1 capsule (40 mg total) by mouth 2 (two) times daily before a meal.  180 capsule  3  .  fluticasone (FLONASE) 50 MCG/ACT nasal spray Place 2 sprays into both nostrils daily.  48 g  3  . Fluticasone-Salmeterol (ADVAIR) 500-50 MCG/DOSE AEPB Inhale 1 puff into the lungs 2 (two) times daily.  180 each  3  . levalbuterol (XOPENEX) 1.25 MG/3ML nebulizer solution Take 1.25 mg by nebulization 3 (three) times daily.  270 mL  11  . LORazepam (ATIVAN) 0.5 MG tablet Take 1 tablet (0.5 mg total) by mouth as needed.  30 tablet  3  . losartan (COZAAR) 25 MG tablet Take 1 tablet (25 mg total) by mouth daily.  30 tablet  6  . nitroGLYCERIN (NITROSTAT) 0.4 MG SL tablet Place 1 tablet (0.4 mg total) under the tongue every 5 (five) minutes as needed.  25 tablet  3  . Pseudoephedrine-DM-GG (ROBITUSSIN COLD & COUGH PO) As directed per bottle when needed for cough      . sertraline (ZOLOFT) 100 MG tablet Take 100 mg by mouth daily.      . sucralfate (CARAFATE) 1 GM/10ML suspension Take 10 mLs (1 g total) by mouth 4 (four) times daily.  420 mL  3  . tiotropium (SPIRIVA) 18 MCG inhalation capsule Place 1 capsule (18 mcg total) into inhaler and inhale daily.  90 capsule  3   Current Facility-Administered Medications on File Prior to Visit  Medication Dose Route Frequency Provider Last Rate Last Dose  . albuterol (PROVENTIL) (  2.5 MG/3ML) 0.083% nebulizer solution 2.5 mg  2.5 mg Nebulization Once [name], MD      . albuterol (PROVENTIL) (2.5 MG/3ML) 0.083% nebulizer solution 2.5 mg  2.5 mg Nebulization Once [name], MD         Past Medical History  Diagnosis Date  . Hyperlipidemia   . COPD (chronic obstructive pulmonary disease)   . Depression   . GERD (gastroesophageal reflux disease)   . Colon polyps 08/2012    colonoscopy; multiple colon polyps; repeat colonoscopy in one year.  . Status post dilation of esophageal narrowing 2000  . Peptic ulcer   . Hypertension   . CARDIOMYOPATHY 03/24/2010    did not tolerate Toprol and Lisinopril.   . Insomnia   . Hemorrhoids   . MI (myocardial  infarction)   . Atherosclerosis   . Anxiety   . Headache(784.0)   . Tinea pedis   . Myalgia   . Helicobacter pylori (H. pylori)   . Thyroid dysfunction   . Chest pain   . Back pain   . Hernia     inguinal  . Allergic rhinitis   . Bronchitis     09-14-11  . Chronic airway obstruction, not elsewhere classified   . Coronary artery disease 11/2009    Anterior MI with late presentation 11/2009. Cath showed a 100% proximal LAD occlusion, 99% mid RCA. Had a PCI and 2 DES to both LAD and RCA. EF was 35%. Nuclear stress test 05/13: prior anterior/inferior infarcts without ischemic, EF 43%, cardiac cath in 02/14: Widely patent stents with no other obstructive disease. EF 40%      Past Surgical History  Procedure Laterality Date  . Penile prosthesis implant    . Esophagogastroduodenoscopy  2008  . Colonoscopy    . Esophageal dilation    . Hernia repair  07/20/2012    L inguinal hernia repair  . Admission  12/20/2012    COPD exacerbation.  [facility].  . Esophagogastroduodenoscopy  08/20/2012  . Cardiac catheterization    . Coronary angioplasty with stent placement  09/2009    LAD 3.0 X23 mm Xience DES, RCA: 4.0 X 15 mm Xience DES  . Cardiac catheterization  02/05/13    [facility]  . Cardiac catheterization  10/14    [facility] no stent     Family History  Problem Relation Age of Onset  . Heart attack Brother     Brother #1  . Diabetes Brother     brother #2  . Hypertension Brother     #3  . Coronary artery disease Father 52    deceased  . COPD Mother 27    deceased  . Heart attack Sister 26    acute-cocaine abuse/deceased  . Heart attack Father   . Diabetes Father      History   Social History  . Marital Status: Married    Spouse Name: N/A    Number of Children: 5  . 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 Smoking status: Current Every Day Smoker -- 2.00 packs/day for 42 years    Types: Cigarettes  . Smokeless  tobacco: Never Used  . Alcohol Use: No  . Drug Use: No  . Sexual Activity: Yes   Other Topics Concern  . Not on file   Social History Narrative   Marital status: married x 31 years; happily married.      Children:  4 children;  1 stepchild and 15 grandchildren; no great grandchildren.      Lives: with wife; 2 dogs, 1 cat.      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      Tobacco: 1-2 ppd x 45 years      Alcohol: none      Drugs: none      Exercise: no formal exercise; physically demanding job; owns horses.      Seatbelts:  100%      Guns: one loaded unsecured gun in the home.      Sunscreen: none      PHYSICAL EXAM   BP 135/80  Pulse 67  Ht 5\' 10"  (1.778 m)  Wt 163 lb 4 oz (74.05 kg)  BMI 23.42 kg/m2 Constitutional: He is oriented to person, place, and time. He appears well-developed and well-nourished. No distress.  HENT: No nasal discharge.  Head: Normocephalic and atraumatic.  Eyes: Pupils are equal and round. Right eye exhibits no discharge. Left eye exhibits no discharge.  Neck: Normal range of motion. Neck supple. No JVD present. No thyromegaly present.  Cardiovascular: Normal rate, regular rhythm, normal heart sounds and. Exam reveals no gallop and no friction rub. No murmur heard.  Pulmonary/Chest: Effort normal and diminished breath sounds. No stridor. No respiratory distress. He has no wheezes. He has no rales. He exhibits no tenderness.  Abdominal: Soft. Bowel sounds are normal. He exhibits no distension. There is no tenderness. There is no rebound and no guarding.  Musculoskeletal: Normal range of motion. He exhibits no edema and no tenderness.  Neurological: He is alert and oriented to person, place, and time. Coordination normal.  Skin: Skin is warm and dry. No rash noted. He is not diaphoretic. No erythema. No pallor.  Psychiatric: He has a normal mood and affect. His behavior is normal. Judgment and thought content normal.  Right radial  pulse is normal with no hematoma.      EKG: Sinus  Rhythm  -Old anterior infarct.   -Nonspecific ST depression   +   Nonspecific T-abnormality.   ABNORMAL   ASSESSMENT AND PLAN

## 2013-11-06 ENCOUNTER — Encounter: Payer: Self-pay | Admitting: Cardiovascular Disease

## 2013-11-06 NOTE — Assessment & Plan Note (Signed)
Lab Results  Component Value Date   CHOL 142 02/14/2013   HDL 47 02/14/2013   LDLCALC 80 02/14/2013   TRIG 75 02/14/2013   CHOLHDL 3.0 02/14/2013   Continue treatment with atorvastatin.

## 2013-11-06 NOTE — Assessment & Plan Note (Signed)
We discussed smoking cessation today. He wants to try quitting on his own.

## 2013-11-06 NOTE — Assessment & Plan Note (Signed)
Ischemic cardiomyopathy with an ejection fraction of 40%. No clinical heart failure. Continue losartan.

## 2013-11-06 NOTE — Assessment & Plan Note (Signed)
Blood pressure is well controlled 

## 2013-11-06 NOTE — Assessment & Plan Note (Signed)
Recent cardiac catheterization showed patent stents with no significant change in coronary anatomy. Ejection fraction was 40%. The patient did not tolerate a beta blocker due to worsening dyspnea. Continue treatment with losartan. Continue dual antiplatelet therapy.

## 2013-11-27 ENCOUNTER — Ambulatory Visit: Payer: BC Managed Care – PPO

## 2013-11-27 ENCOUNTER — Encounter: Payer: Self-pay | Admitting: Family Medicine

## 2013-11-27 ENCOUNTER — Ambulatory Visit (INDEPENDENT_AMBULATORY_CARE_PROVIDER_SITE_OTHER): Payer: BC Managed Care – PPO | Admitting: Family Medicine

## 2013-11-27 VITALS — BP 128/68 | HR 80 | Temp 97.8°F | Resp 16 | Ht 68.0 in | Wt 161.2 lb

## 2013-11-27 DIAGNOSIS — J449 Chronic obstructive pulmonary disease, unspecified: Secondary | ICD-10-CM

## 2013-11-27 DIAGNOSIS — R05 Cough: Secondary | ICD-10-CM

## 2013-11-27 DIAGNOSIS — J4489 Other specified chronic obstructive pulmonary disease: Secondary | ICD-10-CM

## 2013-11-27 DIAGNOSIS — R059 Cough, unspecified: Secondary | ICD-10-CM

## 2013-11-27 DIAGNOSIS — J441 Chronic obstructive pulmonary disease with (acute) exacerbation: Secondary | ICD-10-CM

## 2013-11-27 DIAGNOSIS — J029 Acute pharyngitis, unspecified: Secondary | ICD-10-CM

## 2013-11-27 LAB — POCT CBC
Granulocyte percent: 58.9 %G (ref 37–80)
Hemoglobin: 14.9 g/dL (ref 14.1–18.1)
MCH, POC: 28.5 pg (ref 27–31.2)
MID (cbc): 0.5 (ref 0–0.9)
MPV: 9.1 fL (ref 0–99.8)
POC Granulocyte: 2.1 (ref 2–6.9)
POC MID %: 12.8 %M — AB (ref 0–12)
Platelet Count, POC: 179 10*3/uL (ref 142–424)
RBC: 5.22 M/uL (ref 4.69–6.13)

## 2013-11-27 LAB — POCT INFLUENZA A/B: Influenza A, POC: NEGATIVE

## 2013-11-27 LAB — POCT RAPID STREP A (OFFICE): Rapid Strep A Screen: NEGATIVE
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
## 2013-11-27 MED ORDER — AZITHROMYCIN 250 MG PO TABS: ORAL_TABLET | ORAL | Status: DC

## 2013-11-27 MED ORDER — OSELTAMIVIR PHOSPHATE 75 MG PO CAPS: 75.0000 mg | ORAL_CAPSULE | Freq: Two times a day (BID) | ORAL | Status: DC

## 2013-11-27 MED ORDER — METHYLPREDNISOLONE ACETATE 80 MG/ML IJ SUSP
120.0000 mg | Freq: Once | INTRAMUSCULAR | Status: AC
  Administered 2013-11-27: 120 mg via INTRAMUSCULAR

## 2013-11-27 MED ORDER — PREDNISONE 20 MG PO TABS: ORAL_TABLET | ORAL | Status: DC

## 2013-11-27 NOTE — Progress Notes (Signed)
Subjective:    Patient ID: Leslie Sims, male    DOB: Jun 14, 1951, 62 y.o.   MRN: [id number]  HPI This 62 y.o. male presents for acute visit:  1.  Cold: onset two days ago.  No fever; +chills; no sweats.  +HA.  No ear pain.  +ST diffuse; +pain with swallowing.  Mild rhinorrhea; mild congestion; +PND.  +Mostly a cough; +sputum clear.  +SOB really severe; cannot walk far in the house without having to stop to breath.  Onset of SOB yesterday.  Wheezing a lot. Nebulizer bid.  Using Advair and Spiriva.  +sputum clear.  Using rescue inhaler quite a bit; 5-6 times per day. +body aches.  S/p flu vaccine.  Continues to smoke.  Wife with same exact symptoms. Has been taking Mucinex.  Review of Systems  Constitutional: Positive for chills, activity change and fatigue. Negative for fever and diaphoresis.  HENT: Positive for congestion, postnasal drip, rhinorrhea, sore throat and trouble swallowing. Negative for ear pain and sinus pressure.   Respiratory: Positive for cough, shortness of breath and wheezing.   Cardiovascular: Negative for chest pain.  Gastrointestinal: Negative for nausea, vomiting and diarrhea.  Neurological: Positive for headaches. Negative for dizziness and light-headedness.   Past Medical History  Diagnosis Date  . Hyperlipidemia   . COPD (chronic obstructive pulmonary disease)   . Depression   . GERD (gastroesophageal reflux disease)   . Colon polyps 08/2012    colonoscopy; multiple colon polyps; repeat colonoscopy in one year.  . Status post dilation of esophageal narrowing 2000  . Peptic ulcer   . Hypertension   . CARDIOMYOPATHY 03/24/2010    did not tolerate Toprol and Lisinopril.   . Insomnia   . Hemorrhoids   . MI (myocardial infarction)   . Atherosclerosis   . Anxiety   . Headache(784.0)   . Tinea pedis   . Myalgia   . Helicobacter pylori (H. pylori)   . Thyroid dysfunction   . Chest pain   . Back pain   . Hernia     inguinal  . Allergic rhinitis   .  Bronchitis     09-14-11  . Chronic airway obstruction, not elsewhere classified   . Coronary artery disease 11/2009    Anterior MI with late presentation 11/2009. Cath showed a 100% proximal LAD occlusion, 99% mid RCA. Had a PCI and 2 DES to both LAD and RCA. EF was 35%. Nuclear stress test 05/13: prior anterior/inferior infarcts without ischemic, EF 43%, cardiac cath in 02/14: Widely patent stents with no other obstructive disease. EF 40%    Allergies  Allergen Reactions  . Wellbutrin [Bupropion] Other (See Comments)    Unknown/"made me feel real funny"   Current Outpatient Prescriptions on File Prior to Visit  Medication Sig Dispense Refill  . albuterol (VENTOLIN HFA) 108 (90 BASE) MCG/ACT inhaler Inhale 2 puffs into the lungs every 6 (six) hours as needed. For wheezing  54 g  3  . aspirin 81 MG tablet Take 81 mg by mouth daily.        [name] atorvastatin (LIPITOR) 20 MG tablet Take 1 tablet (20 mg total) by mouth daily.  90 tablet  3  . clopidogrel (PLAVIX) 75 MG tablet Take 1 tablet (75 mg total) by mouth daily.  90 tablet  3  . esomeprazole (NEXIUM) 40 MG capsule Take 1 capsule (40 mg total) by mouth 2 (two) times daily before a meal.  180 capsule  3  . fluticasone (FLONASE) 50  MCG/ACT nasal spray Place 2 sprays into both nostrils daily.  48 g  3  . Fluticasone-Salmeterol (ADVAIR) 500-50 MCG/DOSE AEPB Inhale 1 puff into the lungs 2 (two) times daily.  180 each  3  . levalbuterol (XOPENEX) 1.25 MG/3ML nebulizer solution Take 1.25 mg by nebulization 3 (three) times daily.  270 mL  11  . LORazepam (ATIVAN) 0.5 MG tablet Take 1 tablet (0.5 mg total) by mouth as needed.  30 tablet  3  . losartan (COZAAR) 25 MG tablet Take 1 tablet (25 mg total) by mouth daily.  90 tablet  4  . nitroGLYCERIN (NITROSTAT) 0.4 MG SL tablet Place 1 tablet (0.4 mg total) under the tongue every 5 (five) minutes as needed.  25 tablet  3  . Pseudoephedrine-DM-GG (ROBITUSSIN COLD & COUGH PO) As directed per bottle when  needed for cough      . sertraline (ZOLOFT) 100 MG tablet Take 100 mg by mouth daily.      [name] tiotropium (SPIRIVA) 18 MCG inhalation capsule Place 1 capsule (18 mcg total) into inhaler and inhale daily.  90 capsule  3  . sucralfate (CARAFATE) 1 GM/10ML suspension Take 10 mLs (1 g total) by mouth 4 (four) times daily.  420 mL  3   Current Facility-Administered Medications on File Prior to Visit  Medication Dose Route Frequency Provider Last Rate Last Dose  . albuterol (PROVENTIL) (2.5 MG/3ML) 0.083% nebulizer solution 2.5 mg  2.5 mg Nebulization Once [name], MD      . albuterol (PROVENTIL) (2.5 MG/3ML) 0.083% nebulizer solution 2.5 mg  2.5 mg Nebulization Once [name], MD       History   Social History  . Marital Status: Married    Spouse Name: N/A    Number of Children: 5  . 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 Smoking status: Current Every Day Smoker -- 2.00 packs/day for 42 years    Types: Cigarettes  . Smokeless tobacco: Never Used  . Alcohol Use: No  . Drug Use: No  . Sexual Activity: Yes   Other Topics Concern  . Not on file   Social History Narrative   Marital status: married x 31 years; happily married.      Children: 4 children;  1 stepchild and 15 grandchildren; no great grandchildren.      Lives: with wife; 2 dogs, 1 cat.      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      Tobacco: 1-2 ppd x 45 years      Alcohol: none      Drugs: none      Exercise: no formal exercise; physically demanding job; owns horses.      Seatbelts:  100%      Guns: one loaded unsecured gun in the home.      Sunscreen: none       Objective:   Physical Exam  Nursing note and vitals reviewed. Constitutional: He is oriented to person, place, and time. He appears well-developed and well-nourished. He appears ill. No distress.  HENT:  Head: Normocephalic and atraumatic.  Right Ear:  External ear normal.  Left Ear: External ear normal.  Nose: Mucosal edema and rhinorrhea present.  Mouth/Throat: Uvula is midline and mucous membranes are normal. Posterior oropharyngeal erythema present. No oropharyngeal exudate, posterior oropharyngeal edema or tonsillar abscesses.  Eyes: Conjunctivae and EOM are normal. Pupils  are equal, round, and reactive to light.  Neck: Normal range of motion. Neck supple.  Cardiovascular: Normal rate, regular rhythm and normal heart sounds.   Distant heart sounds chronic.  Pulmonary/Chest: No respiratory distress. He has wheezes in the right upper field, the right middle field, the left upper field and the left middle field. He has no rhonchi. He has no rales.  Moderate air movement; distant breath sounds throughout which is chronic; speaking complete sentences; RR 24.  Prolonged expiratory phase.  Lymphadenopathy:    He has no cervical adenopathy.  Neurological: He is alert and oriented to person, place, and time.  Skin: Skin is warm and dry. No rash noted. He is not diaphoretic.  Psychiatric: He has a normal mood and affect. His behavior is normal.   ALBUTEROL 5.0MG  NEBULIZER ADMINISTERED IN OFFICE. POST-NEBULIZER PULSE OXIMETRY: 93% POST-NEBULIZER LUNG EXAM: IMPROVED AIR MOVEMENT; DECREASED WHEEZING BUT PERSISTENT WHEEZING THROUGHOUT.  SPEAKING COMPLETE SENTENCES.  DEPOMEDROL 120MG  IM ADMINISTERED.  [facility] reading (PRIMARY) by  Dr. Katrinka Blazing.  CXR: NAD  Results for orders placed in visit on 11/27/13  POCT CBC      Result Value Range   WBC 3.6 (*) 4.6 - 10.2 K/uL   Lymph, poc 1.0  0.6 - 3.4   POC LYMPH PERCENT 28.3  10 - 50 %L   MID (cbc) 0.5  0 - 0.9   POC MID % 12.8 (*) 0 - 12 %M   POC Granulocyte 2.1  2 - 6.9   Granulocyte percent 58.9  37 - 80 %G   RBC 5.22  4.69 - 6.13 M/uL   Hemoglobin 14.9  14.1 - 18.1 g/dL   HCT, POC 14.7  82.9 - 53.7 %   MCV 94.0  80 - 97 fL   MCH, POC 28.5  27 - 31.2 pg   MCHC 30.3 (*) 31.8 - 35.4 g/dL   RDW, POC  56.2     Platelet Count, POC 179  142 - 424 K/uL   MPV 9.1  0 - 99.8 fL  POCT INFLUENZA A/B      Result Value Range   Influenza A, POC Negative     Influenza B, POC Negative    POCT RAPID STREP A (OFFICE)      Result Value Range   Rapid Strep A Screen Negative  Negative         Assessment & Plan:  Cough - Plan: POCT Influenza A/B, DG Chest 2 View  Acute pharyngitis - Plan: POCT CBC, POCT Influenza A/B, POCT rapid strep A, Culture, Group A Strep, methylPREDNISolone acetate (DEPO-MEDROL) injection 120 mg  COPD exacerbation - Plan: DG Chest 2 View, methylPREDNISolone acetate (DEPO-MEDROL) injection 120 mg  1.  Flu like illness:  New.  Onset in past 48 hours.  Treat empirically with Tamiflu due to comorbidities of COPD, CAD.  Also will cover for atypicals with high dose Zithromax for five days. Close follow-up in 24-48 hours.  To ED for acute decline. 2.  COPD exacerbation: moderate.  S/p Albuterol nebulizer in office; s/p depomedrol in office; rx for Prednisone taper; increase Albuterol nebulizer to qid scheduled for next week and then PRN.  Close follow-up in 24-48 hours; to ED for acute decline.    Meds ordered this encounter  Medications  . predniSONE (DELTASONE) 20 MG tablet    Sig: Three tablets daily x 2 days then two tablets daily x 5 days then 1 tablet daily x 5 days    Dispense:  21 tablet  Refill:  0  . azithromycin (ZITHROMAX) 250 MG tablet    Sig: Two tablets daily x 5 days    Dispense:  10 tablet    Refill:  0  . oseltamivir (TAMIFLU) 75 MG capsule    Sig: Take 1 capsule (75 mg total) by mouth 2 (two) times daily.    Dispense:  10 capsule    Refill:  0  . methylPREDNISolone acetate (DEPO-MEDROL) injection 120 mg    Sig:

## 2013-11-29 ENCOUNTER — Ambulatory Visit (INDEPENDENT_AMBULATORY_CARE_PROVIDER_SITE_OTHER): Payer: BC Managed Care – PPO | Admitting: Family Medicine

## 2013-11-29 ENCOUNTER — Encounter: Payer: Self-pay | Admitting: Family Medicine

## 2013-11-29 VITALS — BP 124/84 | HR 80 | Temp 97.5°F | Resp 18 | Ht 68.5 in | Wt 162.0 lb

## 2013-11-29 DIAGNOSIS — J209 Acute bronchitis, unspecified: Secondary | ICD-10-CM

## 2013-11-29 DIAGNOSIS — J441 Chronic obstructive pulmonary disease with (acute) exacerbation: Secondary | ICD-10-CM

## 2013-11-29 DIAGNOSIS — F341 Dysthymic disorder: Secondary | ICD-10-CM

## 2013-11-29 LAB — CULTURE, GROUP A STREP

## 2013-11-29 MED ORDER — ALBUTEROL SULFATE (2.5 MG/3ML) 0.083% IN NEBU: 2.5000 mg | INHALATION_SOLUTION | Freq: Four times a day (QID) | RESPIRATORY_TRACT | Status: DC | PRN

## 2013-11-29 NOTE — Progress Notes (Signed)
This chart was scribed for Leslie Chick, MD by Joaquin Music, ED Scribe. This patient was seen in room Room/bed 3 and the patient's care was started at 2:23 PM. Subjective:    Patient ID: Leslie Sims, male    DOB: Jun 10, 1951, 62 y.o.   MRN: [id number] Chief Complaint  Patient presents with  . Follow-up    to see dr Katrinka Blazing feeling some better  . COPD   HPI Leslie Sims is a 62 y.o. male who presents to the [facility] for 48 hour F/U apt for COPD exacerbation and acute bronchitis. Pt was treated with albuterol and Atrovent in the office as well as a DepoMedrol injection, Zithromax, and Tamiflu at last visit.  Patient reports that he feels better but his breathing is no different and he continues to struggle with severe DOE.  Pt states he is still feeling congested with associated sore throat, chest tightness, SOB, and HA. He states his breathing has not improved with his nebulizer,Z-Pack, and other medications. He states he does not suspect he has gotten worse but states his breathing has not improved "at all". Pt states he had trouble breathing while in the shower today. He states this has happened in the past but he has a tendency to have breathing problems while showering. He denies rhinorrhea, congestion, otalgia, nausea, emesis, and diarrhea. He has been using Xopenex nebulizer tid.  Pt states his last nebulizer tx was at 11am this morning (11/29/2013). Pt states he has taken 3 Prednisone today.   Active Ambulatory Problems    Diagnosis Date Noted  . COLONIC POLYPS 03/24/2010  . HYPERLIPIDEMIA 03/24/2010  . DEPRESSION/ANXIETY 03/24/2010  . SMOKER 07/30/2010  . UNSPECIFIED ESSENTIAL HYPERTENSION 11/21/2009  . CAD, NATIVE VESSEL 04/23/2010  . CARDIOMYOPATHY 03/24/2010  . COPD 03/24/2010  . GERD 03/24/2010  . CHEST PAIN UNSPECIFIED 11/21/2009  . Bradycardia 04/20/2011  . Hypertension   . Leg pain 05/29/2012  . Cough 11/14/2012  . COPD exacerbation 01/02/2013  .  Sinusitis 01/02/2013  . Annual physical exam 02/18/2013  . Seasonal and perennial allergic rhinitis 05/29/2013  . Sleep disorder 07/18/2013   Resolved Ambulatory Problems    Diagnosis Date Noted  . No Resolved Ambulatory Problems   Past Medical History  Diagnosis Date  . Hyperlipidemia   . COPD (chronic obstructive pulmonary disease)   . Depression   . GERD (gastroesophageal reflux disease)   . Colon polyps 08/2012  . Status post dilation of esophageal narrowing 2000  . Peptic ulcer   . Insomnia   . Hemorrhoids   . MI (myocardial infarction)   . Atherosclerosis   . Anxiety   . Headache(784.0)   . Tinea pedis   . Myalgia   . Helicobacter pylori (H. pylori)   . Thyroid dysfunction   . Chest pain   . Back pain   . Hernia   . Allergic rhinitis   . Bronchitis   . Coronary artery disease 11/2009   Allergies  Allergen Reactions  . Wellbutrin [Bupropion] Other (See Comments)    Unknown/"made me feel real funny"   Current outpatient prescriptions:albuterol (VENTOLIN HFA) 108 (90 BASE) MCG/ACT inhaler, Inhale 2 puffs into the lungs every 6 (six) hours as needed. For wheezing, Disp: 54 g, Rfl: 3;  aspirin 81 MG tablet, Take 81 mg by mouth daily.  , Disp: , Rfl: ;  atorvastatin (LIPITOR) 20 MG tablet, Take 1 tablet (20 mg total) by mouth daily., Disp: 90 tablet, Rfl: 3;  azithromycin (ZITHROMAX) 250  MG tablet, Two tablets daily x 5 days, Disp: 10 tablet, Rfl: 0 clopidogrel (PLAVIX) 75 MG tablet, Take 1 tablet (75 mg total) by mouth daily., Disp: 90 tablet, Rfl: 3;  esomeprazole (NEXIUM) 40 MG capsule, Take 1 capsule (40 mg total) by mouth 2 (two) times daily before a meal., Disp: 180 capsule, Rfl: 3;  fluticasone (FLONASE) 50 MCG/ACT nasal spray, Place 2 sprays into both nostrils daily., Disp: 48 g, Rfl: 3 Fluticasone-Salmeterol (ADVAIR) 500-50 MCG/DOSE AEPB, Inhale 1 puff into the lungs 2 (two) times daily., Disp: 180 each, Rfl: 3;  levalbuterol (XOPENEX) 1.25 MG/3ML nebulizer solution,  Take 1.25 mg by nebulization 3 (three) times daily., Disp: 270 mL, Rfl: 11;  LORazepam (ATIVAN) 0.5 MG tablet, Take 1 tablet (0.5 mg total) by mouth as needed., Disp: 30 tablet, Rfl: 3 losartan (COZAAR) 25 MG tablet, Take 1 tablet (25 mg total) by mouth daily., Disp: 90 tablet, Rfl: 4;  nitroGLYCERIN (NITROSTAT) 0.4 MG SL tablet, Place 1 tablet (0.4 mg total) under the tongue every 5 (five) minutes as needed., Disp: 25 tablet, Rfl: 3;  oseltamivir (TAMIFLU) 75 MG capsule, Take 1 capsule (75 mg total) by mouth 2 (two) times daily., Disp: 10 capsule, Rfl: 0 predniSONE (DELTASONE) 20 MG tablet, Three tablets daily x 2 days then two tablets daily x 5 days then 1 tablet daily x 5 days, Disp: 21 tablet, Rfl: 0;  Pseudoephedrine-DM-GG (ROBITUSSIN COLD & COUGH PO), As directed per bottle when needed for cough, Disp: , Rfl: ;  sertraline (ZOLOFT) 100 MG tablet, Take 100 mg by mouth daily., Disp: , Rfl:  sucralfate (CARAFATE) 1 GM/10ML suspension, Take 10 mLs (1 g total) by mouth 4 (four) times daily., Disp: 420 mL, Rfl: 3;  tiotropium (SPIRIVA) 18 MCG inhalation capsule, Place 1 capsule (18 mcg total) into inhaler and inhale daily., Disp: 90 capsule, Rfl: 3;  albuterol (PROVENTIL) (2.5 MG/3ML) 0.083% nebulizer solution, Take 3 mLs (2.5 mg total) by nebulization every 6 (six) hours as needed for wheezing or shortness of breath., Disp: 75 mL, Rfl: 12 Current facility-administered medications:albuterol (PROVENTIL) (2.5 MG/3ML) 0.083% nebulizer solution 2.5 mg, 2.5 mg, Nebulization, Once, Leslie Chick, MD;  albuterol (PROVENTIL) (2.5 MG/3ML) 0.083% nebulizer solution 2.5 mg, 2.5 mg, Nebulization, Once, Leslie Chick, MD  Review of Systems  Constitutional: Positive for activity change. Negative for fever, chills, diaphoresis and fatigue.  HENT: Positive for sore throat. Negative for congestion, ear pain, postnasal drip, rhinorrhea, sinus pressure and trouble swallowing.   Respiratory: Positive for cough, chest  tightness, shortness of breath and wheezing.   Cardiovascular: Negative for chest pain and leg swelling.  Gastrointestinal: Negative for nausea, vomiting, abdominal pain and diarrhea.  Neurological: Positive for headaches. Negative for dizziness and light-headedness.   Objective:   Physical Exam  Nursing note and vitals reviewed. Constitutional: He appears well-developed and well-nourished.  HENT:  Head: Normocephalic.  Right Ear: External ear normal.  Left Ear: External ear normal.  Mouth/Throat: Oropharynx is clear and moist. No oropharyngeal exudate.  Eyes: Conjunctivae and EOM are normal. Pupils are equal, round, and reactive to light.  Neck: Neck supple.  Cardiovascular: Normal rate, regular rhythm and normal heart sounds.   No murmur heard. Pulmonary/Chest: He has wheezes.  Scant expiratory wheezes throughout. Distant breath sounds throughout (baseline).  RR 20.  +prolonged expiratory phase.  Speaking complete sentences. No retractions or accessory muscle use.  Lymphadenopathy:    He has no cervical adenopathy.  Psychiatric: He has a normal mood and affect. His  behavior is normal.   Results for orders placed in visit on 11/27/13  CULTURE, GROUP A STREP      Result Value Range   Organism ID, Bacteria Normal Upper Respiratory Flora     Organism ID, Bacteria No Beta Hemolytic Streptococci Isolated    POCT CBC      Result Value Range   WBC 3.6 (*) 4.6 - 10.2 K/uL   Lymph, poc 1.0  0.6 - 3.4   POC LYMPH PERCENT 28.3  10 - 50 %L   MID (cbc) 0.5  0 - 0.9   POC MID % 12.8 (*) 0 - 12 %M   POC Granulocyte 2.1  2 - 6.9   Granulocyte percent 58.9  37 - 80 %G   RBC 5.22  4.69 - 6.13 M/uL   Hemoglobin 14.9  14.1 - 18.1 g/dL   HCT, POC 57.8  46.9 - 53.7 %   MCV 94.0  80 - 97 fL   MCH, POC 28.5  27 - 31.2 pg   MCHC 30.3 (*) 31.8 - 35.4 g/dL   RDW, POC 62.9     Platelet Count, POC 179  142 - 424 K/uL   MPV 9.1  0 - 99.8 fL  POCT INFLUENZA A/B      Result Value Range   Influenza  A, POC Negative     Influenza B, POC Negative    POCT RAPID STREP A (OFFICE)      Result Value Range   Rapid Strep A Screen Negative  Negative   POST-AMBULATION PULSE OXIMETRY 92% HR 104 RR 22. ALBUTEROL 5MG /ATROVENT 0.5MG  NEBULIZER ADMINISTERED IN OFFICE. 9:39 AM-PE Post Albuterol Tx Pulmonary/Chest-Improved air movement. Distant air movements throughout.  Triage Vitals:BP 124/84  Pulse 80  Temp(Src) 97.5 F (36.4 C) (Oral)  Resp 18  Ht 5' 8.5" (1.74 m)  Wt 162 lb (73.483 kg)  BMI 24.27 kg/m2  SpO2 96% Assessment & Plan:   1. Acute bronchitis   2. COPD exacerbation   3. DEPRESSION/ANXIETY    1. Acute Bronchitis:  Improving; continue high dose Zithromax and Tamiflu.  Feeling better.   2.  COPD exacerbation: stable; minimal improvement in 48 hours but no worsening; s/p Albuterol/Atrovent nebulizer in office; continue Prednisone 60mg  daily x 3 more days and then decrease to 40mg  daily.  Rx for Albuterol nebulizer solution provided to use qid scheduled at home for next week.  To ED for acute decline.  Recommend rest, limiting activity.  Meds ordered this encounter  Medications  . albuterol (PROVENTIL) (2.5 MG/3ML) 0.083% nebulizer solution    Sig: Take 3 mLs (2.5 mg total) by nebulization every 6 (six) hours as needed for wheezing or shortness of breath.    Dispense:  75 mL    Refill:  12   I personally performed the services described in this documentation, which was scribed in my presence.  The recorded information has been reviewed and is accurate.

## 2013-11-29 NOTE — Patient Instructions (Signed)
1.  Continue Prednisone three tablets daily for Friday and Saturday; then decrease to two tablets daily x 5 days (Sun, Mon, Tues, Wed, Thurs) then one tablet daily x 3 days (Fri, Sat, Sun).

## 2013-12-12 ENCOUNTER — Telehealth: Payer: Self-pay | Admitting: Family Medicine

## 2013-12-12 MED ORDER — LEVOFLOXACIN 750 MG PO TABS: 750.0000 mg | ORAL_TABLET | Freq: Every day | ORAL | Status: DC

## 2013-12-12 NOTE — Telephone Encounter (Signed)
Patient called requesting additional antibiotics for recent acute bronchitis with COPD exacerbation.  Still suffering with significant chest congestion, SOB/DOE, wheezing.  Still taking Prednisone taper.  Completed a Zpack.  A/P:  Acute bronchitis with COPD exacerbation: rx for Levaquin 750mg  daily sent to [facility].

## 2013-12-20 ENCOUNTER — Ambulatory Visit (INDEPENDENT_AMBULATORY_CARE_PROVIDER_SITE_OTHER): Payer: BC Managed Care – PPO | Admitting: Family Medicine

## 2013-12-20 ENCOUNTER — Ambulatory Visit: Payer: BC Managed Care – PPO

## 2013-12-20 VITALS — BP 130/78 | HR 115 | Temp 98.0°F | Resp 18 | Ht 68.75 in | Wt 156.2 lb

## 2013-12-20 DIAGNOSIS — J209 Acute bronchitis, unspecified: Secondary | ICD-10-CM

## 2013-12-20 DIAGNOSIS — J441 Chronic obstructive pulmonary disease with (acute) exacerbation: Secondary | ICD-10-CM

## 2013-12-20 DIAGNOSIS — J449 Chronic obstructive pulmonary disease, unspecified: Secondary | ICD-10-CM

## 2013-12-20 DIAGNOSIS — R7309 Other abnormal glucose: Secondary | ICD-10-CM

## 2013-12-20 DIAGNOSIS — F17200 Nicotine dependence, unspecified, uncomplicated: Secondary | ICD-10-CM

## 2013-12-20 LAB — POCT CBC
Granulocyte percent: 83 %G — AB (ref 37–80)
HEMATOCRIT: 51.1 % (ref 43.5–53.7)
HEMOGLOBIN: 16.1 g/dL (ref 14.1–18.1)
Lymph, poc: 0.9 (ref 0.6–3.4)
MCH, POC: 29.4 pg (ref 27–31.2)
MCHC: 31.5 g/dL — AB (ref 31.8–35.4)
MCV: 93.3 fL (ref 80–97)
MID (cbc): 0.3 (ref 0–0.9)
MPV: 8.7 fL (ref 0–99.8)
POC Granulocyte: 5.7 (ref 2–6.9)
POC LYMPH PERCENT: 12.4 %L (ref 10–50)
POC MID %: 4.6 %M (ref 0–12)
Platelet Count, POC: 175 10*3/uL (ref 142–424)
RBC: 5.48 M/uL (ref 4.69–6.13)
RDW, POC: 14.5 %
WBC: 6.9 10*3/uL (ref 4.6–10.2)

## 2013-12-20 LAB — GLUCOSE, POCT (MANUAL RESULT ENTRY): POC GLUCOSE: 122 mg/dL — AB (ref 70–99)

## 2013-12-20 IMAGING — CR DG CHEST 2V
2 series · 2 of 2 positions shown · non-contrast
Comparison: [DATE] and [DATE]

CLINICAL DATA: Bronchitis.

EXAM:
CHEST  2 VIEW

[PA]
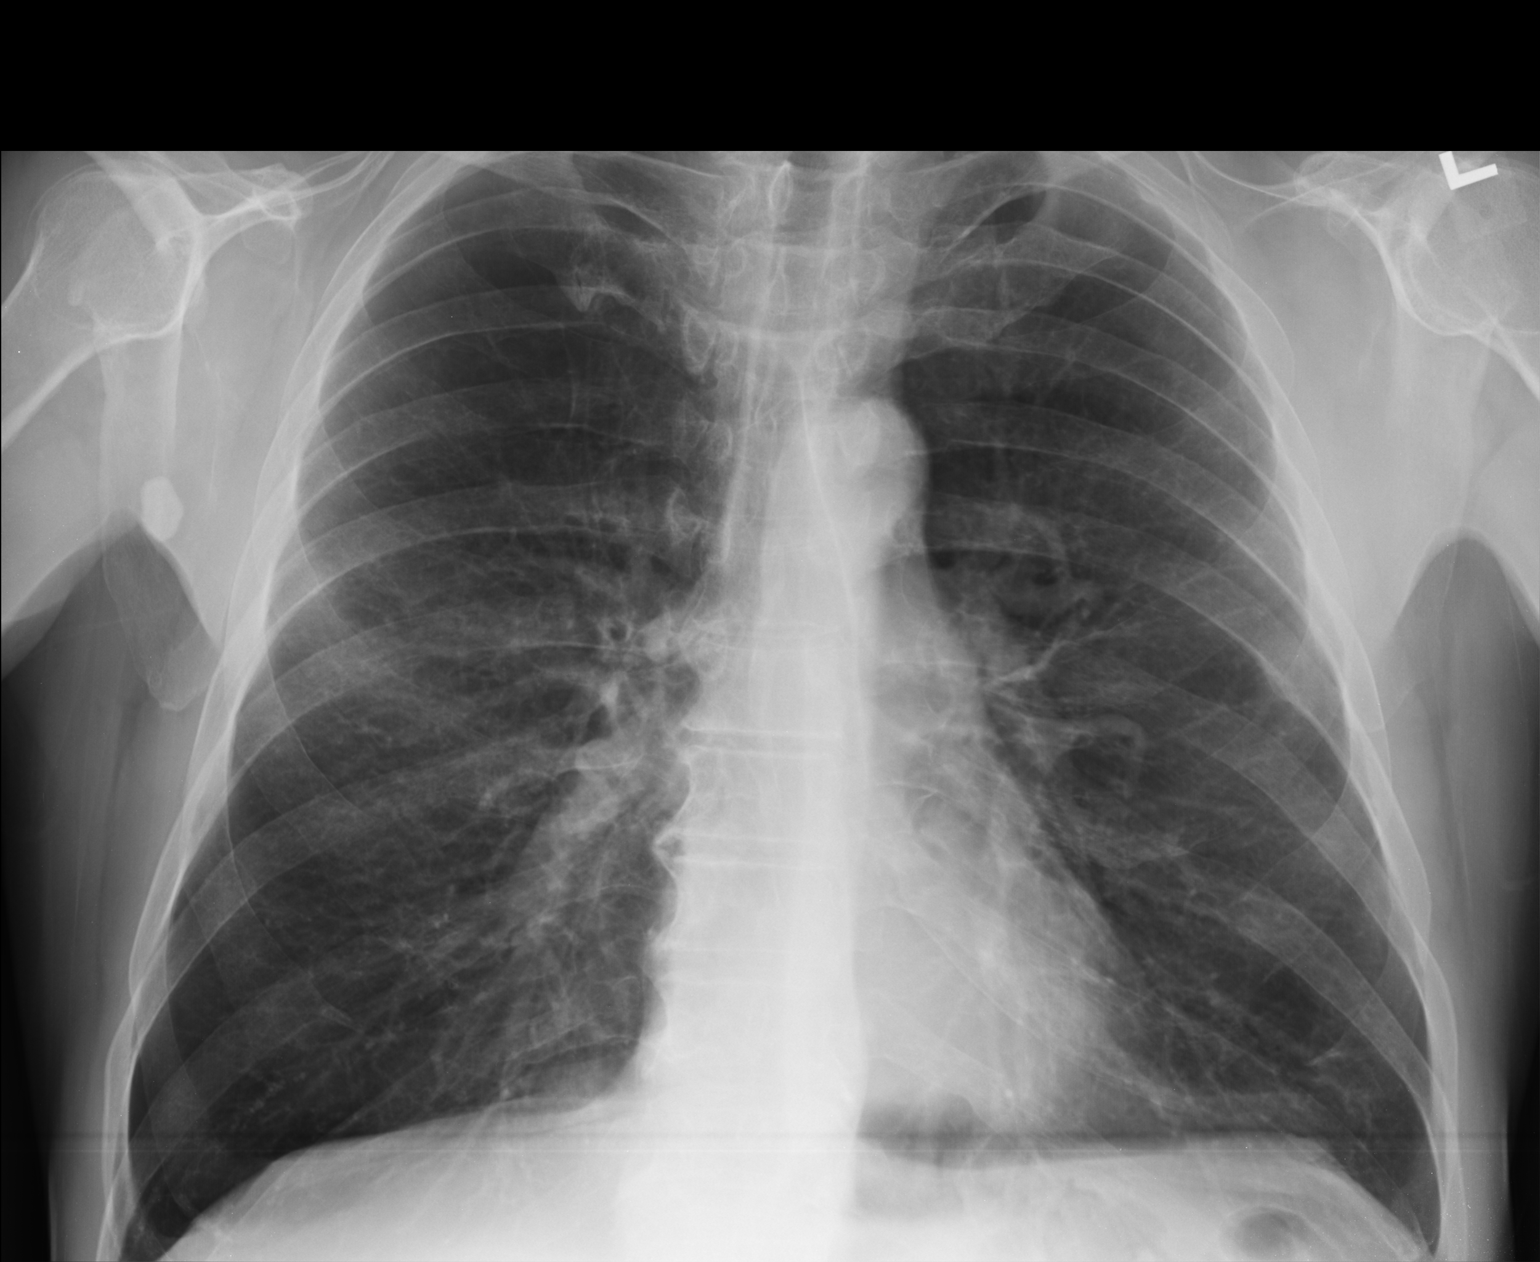

[lateral]
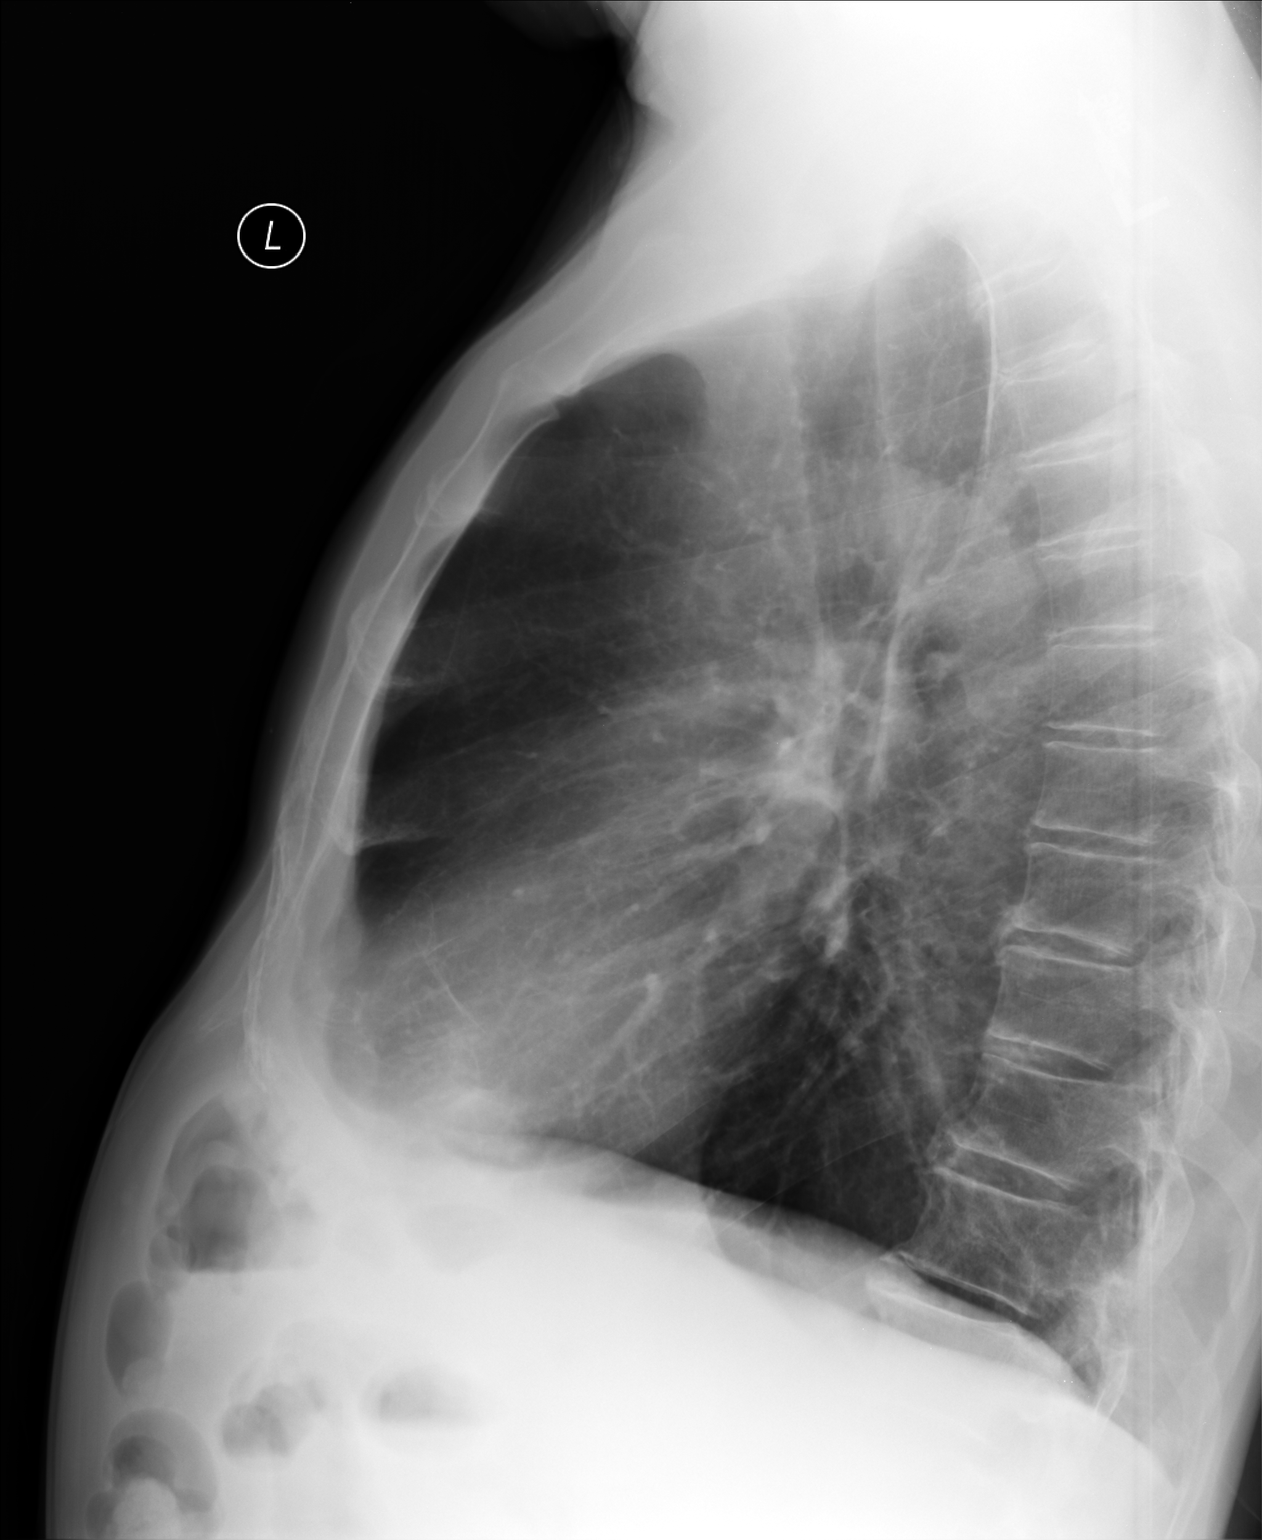

[2 of 2 positions shown; findings below may reference images not displayed]

FINDINGS: Two views of the chest demonstrates hyperinflation and lucency in
the upper lungs. Findings suggest underlying emphysema or COPD.
There is no focal airspace disease. Heart and mediastinum are
stable. Again noted is a large calcification in the right axilla.
Trachea is midline.
IMPRESSION: Chronic lung changes without acute findings.

## 2013-12-20 MED ORDER — ALBUTEROL SULFATE (2.5 MG/3ML) 0.083% IN NEBU
5.0000 mg | INHALATION_SOLUTION | Freq: Once | RESPIRATORY_TRACT | Status: AC
  Administered 2013-12-20: 5 mg via RESPIRATORY_TRACT

## 2013-12-20 MED ORDER — VARENICLINE TARTRATE 0.5 MG X 11 & 1 MG X 42 PO MISC: ORAL | Status: DC

## 2013-12-20 MED ORDER — VARENICLINE TARTRATE 1 MG PO TABS: 1.0000 mg | ORAL_TABLET | Freq: Two times a day (BID) | ORAL | Status: DC

## 2013-12-20 MED ORDER — PREDNISONE 20 MG PO TABS: 20.0000 mg | ORAL_TABLET | Freq: Every day | ORAL | Status: DC

## 2013-12-20 MED ORDER — METHYLPREDNISOLONE ACETATE 80 MG/ML IJ SUSP
120.0000 mg | Freq: Once | INTRAMUSCULAR | Status: AC
  Administered 2013-12-20: 120 mg via INTRAMUSCULAR

## 2013-12-20 NOTE — Progress Notes (Signed)
Subjective:    Patient ID: Leslie Sims, male    DOB: 1951-08-07, 63 y.o.   MRN: [id number]  HPI Scribed for [name] MD, the patient was seen in room 10. This chart was scribed by Denice Bors, ED scribe. Patient's care was started at 9:19 AM,   HPI Comments: Hx was provided by pt.  Leslie Sims is a 63 y.o. male who presents to the [facility] complaining of constant moderate shortness of breath onset two days ago. Reports associated congestion, chills, sweats, sore throat (few days), productive cough, and white thick rhinorrhea. Reports symptoms are aggravated with activity. Reports taking Z-pack, nebulizer, prednisone, Spiriva, Advair, and Levaquin with mild relief of symptoms. Denies associated chest pain, fever, nausea, vomiting, diarrhea, dysphagia, and pain with swallowing.   Past Medical History  Diagnosis Date  . Hyperlipidemia   . COPD (chronic obstructive pulmonary disease)   . Depression   . GERD (gastroesophageal reflux disease)   . Colon polyps 08/2012    colonoscopy; multiple colon polyps; repeat colonoscopy in one year.  . Status post dilation of esophageal narrowing 2000  . Peptic ulcer   . Hypertension   . CARDIOMYOPATHY 03/24/2010    did not tolerate Toprol and Lisinopril.   . Insomnia   . Hemorrhoids   . MI (myocardial infarction)   . Atherosclerosis   . Anxiety   . Headache(784.0)   . Tinea pedis   . Myalgia   . Helicobacter pylori (H. pylori)   . Thyroid dysfunction   . Chest pain   . Back pain   . Hernia     inguinal  . Allergic rhinitis   . Bronchitis     09-14-11  . Chronic airway obstruction, not elsewhere classified   . Coronary artery disease 11/2009    Anterior MI with late presentation 11/2009. Cath showed a 100% proximal LAD occlusion, 99% mid RCA. Had a PCI and 2 DES to both LAD and RCA. EF was 35%. Nuclear stress test 05/13: prior anterior/inferior infarcts without ischemic, EF 43%, cardiac cath in  02/14: Widely patent stents with no other obstructive disease. EF 40%     Past Surgical History  Procedure Laterality Date  . Penile prosthesis implant    . Esophagogastroduodenoscopy  2008  . Colonoscopy    . Esophageal dilation    . Hernia repair  07/20/2012    L inguinal hernia repair  . Admission  12/20/2012    COPD exacerbation.  Leslie Sims.  . Esophagogastroduodenoscopy  08/20/2012  . Cardiac catheterization    . Coronary angioplasty with stent placement  09/2009    LAD 3.0 X23 mm Xience DES, RCA: 4.0 X 15 mm Xience DES  . Cardiac catheterization  02/05/13    [facility]  . Cardiac catheterization  10/14    [facility] : patent stents with no change in anatomy. EF: 40$    Family History  Problem Relation Age of Onset  . Heart attack Brother     Brother #1  . Diabetes Brother     brother #2  . Hypertension Brother     #3  . Coronary artery disease Father 19    deceased  . Heart attack Father   . Diabetes Father   . Heart disease Father   . COPD Mother 69    deceased  . Heart attack Sister 54    acute-cocaine abuse/deceased  . Alcohol abuse Sister     polysubstance abuse  . COPD Sister   .  Alcohol abuse Sister     polysubstance abuse  . Heart disease Brother     AMI x multiple    History   Social History  . Marital Status: Married    Spouse Name: N/A    Number of Children: 34  . 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 Smoking status: Current Every Day Smoker -- 2.00 packs/day for 42 years    Types: Cigarettes  . Smokeless tobacco: Never Used  . Alcohol Use: No  . Drug Use: No  . Sexual Activity: Yes   Other Topics Concern  . Not on file   Social History Narrative   Marital status: married x 32 years; happily married.      Children: 4 children;  1 stepchild and 15 grandchildren; no great grandchildren.      Lives: with wife; 2 dogs, 1 cat.      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      Tobacco: 1 ppd x 45 years      Alcohol: none; previous alcoholism      Drugs: none      Exercise: no formal exercise; physically demanding job; owns horses.      Seatbelts:  100%      Guns: one loaded unsecured gun in the home.      Sunscreen: none    Allergies  Allergen Reactions  . Wellbutrin [Bupropion] Other (See Comments)    Unknown/"made me feel real funny"    Patient Active Problem List   Diagnosis Date Noted  . Glucose intolerance (impaired glucose tolerance) 06/18/2014  . Sleep disorder 07/18/2013  . Seasonal and perennial allergic rhinitis 05/29/2013  . Annual physical exam 02/18/2013  . COPD exacerbation 01/02/2013  . Sinusitis 01/02/2013  . Cough 11/14/2012  . Leg pain 05/29/2012  . Hypertension   . Bradycardia 04/20/2011  . SMOKER 07/30/2010  . CAD, NATIVE VESSEL 04/23/2010  . COLONIC POLYPS 03/24/2010  . HYPERLIPIDEMIA 03/24/2010  . DEPRESSION/ANXIETY 03/24/2010  . CARDIOMYOPATHY 03/24/2010  . COPD 03/24/2010  . GERD 03/24/2010  . UNSPECIFIED ESSENTIAL HYPERTENSION 11/21/2009  . CHEST PAIN UNSPECIFIED 11/21/2009       Review of Systems  Constitutional: Positive for chills and diaphoresis. Negative for fever.  HENT: Positive for congestion, rhinorrhea and sore throat.   Respiratory: Positive for cough and shortness of breath.   Gastrointestinal: Negative for nausea, vomiting and diarrhea.      A complete 10 system review of systems was obtained and all systems are negative except as noted in the HPI and PMHx.    Objective:   Physical Exam  Nursing note and vitals reviewed. Constitutional: He is oriented to person, place, and time. He appears well-developed and well-nourished. No distress.  HENT:  Head: Normocephalic and atraumatic.  Mouth/Throat: Uvula is midline and mucous membranes are normal. Mucous membranes are not dry. Posterior oropharyngeal erythema (mild and diffuse) present. No oropharyngeal exudate, posterior oropharyngeal  edema or tonsillar abscesses.  Eyes: Conjunctivae and EOM are normal.  Neck: Neck supple. No tracheal deviation present.  Cardiovascular: Normal rate and regular rhythm.   No murmur heard. Pulmonary/Chest: Effort normal and breath sounds normal. No accessory muscle usage. Not tachypneic. No respiratory distress. He has no wheezes. He has no rales.  Speaking in complete sentences. Expiratory wheeze, expiratory phase is prolonged, and decreased breath sounds.   Musculoskeletal: Normal range of motion.  Neurological: He  is alert and oriented to person, place, and time.  Skin: Skin is warm and dry.  Psychiatric: He has a normal mood and affect. His behavior is normal.  DIAGNOSTIC STUDIES: Oxygen Saturation is 94% on room air, normal  by my interpretation.    COORDINATION OF CARE:  Nursing notes reviewed. Vital signs reviewed. Initial pt interview and examination performed.   9:19 AM-Discussed work up plan with pt at bedside, which includes CXR, and CBC with diff panel. Pt agrees with plan.  9:19 AM Pt was counseled about the importance of smoking cessation.     Treatment plan initiated:Medications - No data to display   Initial diagnostic testing ordered.      [facility] reading (PRIMARY) by  Dr. Tamala Julian.  CXR: NAD; calcified lymph node R axilla.  ALBUTEROL NEBULIZER 5MG  ADMINISTERED.  METHYLPREDNISOLONE 120MG  IM ADMINISTERED.  Results for orders placed or performed in visit on 12/20/13  POCT CBC  Result Value Ref Range   WBC 6.9 4.6 - 10.2 K/uL   Lymph, poc 0.9 0.6 - 3.4   POC LYMPH PERCENT 12.4 10 - 50 %L   MID (cbc) 0.3 0 - 0.9   POC MID % 4.6 0 - 12 %M   POC Granulocyte 5.7 2 - 6.9   Granulocyte percent 83.0 (A) 37 - 80 %G   RBC 5.48 4.69 - 6.13 M/uL   Hemoglobin 16.1 14.1 - 18.1 g/dL   HCT, POC 51.1 43.5 - 53.7 %   MCV 93.3 80 - 97 fL   MCH, POC 29.4 27 - 31.2 pg   MCHC 31.5 (A) 31.8 - 35.4 g/dL   RDW, POC 14.5 %   Platelet Count, POC 175 142 - 424 K/uL   MPV 8.7 0 -  99.8 fL  POCT glucose (manual entry)  Result Value Ref Range   POC Glucose 122 (A) 70 - 99 mg/dl       Assessment & Plan:  Chronic airway obstruction, not elsewhere classified - Plan: POCT CBC, POCT glucose (manual entry), DG Chest 2 View, albuterol (PROVENTIL) (2.5 MG/3ML) 0.083% nebulizer solution 5 mg, methylPREDNISolone acetate (DEPO-MEDROL) injection 120 mg  Other abnormal glucose - Plan: POCT CBC, POCT glucose (manual entry), DG Chest 2 View, albuterol (PROVENTIL) (2.5 MG/3ML) 0.083% nebulizer solution 5 mg, methylPREDNISolone acetate (DEPO-MEDROL) injection 120 mg  Acute bronchitis - Plan: POCT CBC, POCT glucose (manual entry), DG Chest 2 View, albuterol (PROVENTIL) (2.5 MG/3ML) 0.083% nebulizer solution 5 mg, methylPREDNISolone acetate (DEPO-MEDROL) injection 120 mg  COPD exacerbation  SMOKER  1. COPD exacerbation: persistent; s/p Depomedrol 120mg  IM in office; s/p Albuterol nebulizer in office; continue with Prednisone taper and scheduled nebulizers qid for the next week. RTC for acute worsening. 2.  Acute bronchitis: improving; s/p Zpack and Levaquin.  Continue Mucinex DM bid. 3.  Glucose intolerance: stable sugar. 4.  Tobacco abuse: encourage cessation; rx for Chantix provided; contemplative; wife still smokes as well.  5. Thrush:  New. Rx for Nystatin provided.  Meds ordered this encounter  Medications  . albuterol (PROVENTIL) (2.5 MG/3ML) 0.083% nebulizer solution 5 mg    Sig:   . methylPREDNISolone acetate (DEPO-MEDROL) injection 120 mg    Sig:   . DISCONTD: varenicline (CHANTIX STARTING MONTH PAK) 0.5 MG X 11 & 1 MG X 42 tablet    Sig: Take one 0.5 mg tablet by mouth once daily for 3 days, then increase to one 0.5 mg tablet twice daily for 4 days, then increase to one 1 mg tablet twice daily.  Dispense:  53 tablet    Refill:  0  . DISCONTD: varenicline (CHANTIX CONTINUING MONTH PAK) 1 MG tablet    Sig: Take 1 tablet (1 mg total) by mouth 2 (two) times daily.     Dispense:  60 tablet    Refill:  2  . DISCONTD: predniSONE (DELTASONE) 20 MG tablet    Sig: Take 1 tablet (20 mg total) by mouth daily with breakfast. Three tablets daily x 5 days then two tablets daily x 5 days then one tablet daily x 5 days then 1/2 tablet daily x 5 days    Dispense:  35 tablet    Refill:  0  . DISCONTD: nystatin (MYCOSTATIN) 100000 UNIT/ML suspension    Sig: Take 5 mLs (500,000 Units total) by mouth 4 (four) times daily.    Dispense:  120 mL    Refill:  0    I personally performed the services described in this documentation, which was scribed in my presence.  The recorded information has been reviewed and is accurate.  [name], M.D.  [facility] [address] [place], [place]  [phone] phone [phone] fax

## 2013-12-21 MED ORDER — NYSTATIN 100000 UNIT/ML MT SUSP: 5.0000 mL | Freq: Four times a day (QID) | OROMUCOSAL | Status: DC

## 2013-12-24 ENCOUNTER — Ambulatory Visit: Payer: BC Managed Care – PPO | Admitting: Family Medicine

## 2013-12-25 ENCOUNTER — Telehealth: Payer: Self-pay

## 2013-12-25 ENCOUNTER — Encounter: Payer: Self-pay | Admitting: Pulmonary Disease

## 2013-12-25 NOTE — Telephone Encounter (Signed)
req faxed for Rx for nebulizer. Dr Tamala Julian, is it OK to send Rx pended?

## 2013-12-26 MED ORDER — NEBULIZER COMPRESSOR MISC: Status: DC

## 2013-12-26 NOTE — Telephone Encounter (Signed)
Rx for nebulizer approved.

## 2014-01-08 ENCOUNTER — Encounter: Payer: Self-pay | Admitting: Family Medicine

## 2014-01-08 ENCOUNTER — Ambulatory Visit (INDEPENDENT_AMBULATORY_CARE_PROVIDER_SITE_OTHER): Payer: BC Managed Care – PPO | Admitting: Family Medicine

## 2014-01-08 VITALS — BP 153/80 | HR 101 | Temp 98.3°F | Resp 18 | Ht 68.5 in | Wt 156.0 lb

## 2014-01-08 DIAGNOSIS — J029 Acute pharyngitis, unspecified: Secondary | ICD-10-CM

## 2014-01-08 DIAGNOSIS — E7800 Pure hypercholesterolemia, unspecified: Secondary | ICD-10-CM

## 2014-01-08 DIAGNOSIS — J449 Chronic obstructive pulmonary disease, unspecified: Secondary | ICD-10-CM

## 2014-01-08 DIAGNOSIS — J4489 Other specified chronic obstructive pulmonary disease: Secondary | ICD-10-CM

## 2014-01-08 DIAGNOSIS — I2510 Atherosclerotic heart disease of native coronary artery without angina pectoris: Secondary | ICD-10-CM

## 2014-01-08 DIAGNOSIS — K219 Gastro-esophageal reflux disease without esophagitis: Secondary | ICD-10-CM

## 2014-01-08 DIAGNOSIS — R7309 Other abnormal glucose: Secondary | ICD-10-CM

## 2014-01-08 MED ORDER — PREDNISONE 2.5 MG PO TABS: ORAL_TABLET | ORAL | Status: DC

## 2014-01-08 NOTE — Progress Notes (Signed)
Subjective:    Patient ID: Leslie Sims, male    DOB: May 11, 1951, 63 y.o.   MRN: [id number]  HPI This 63 y.o. male presents for three week follow-up:  1.  COPD exacerbation:  Taking 1/2 tablet daily of Prednisone; has four remaining days.  Currently taking 10mg  daily.  SOB is much improved.  Feels like throat has congestion.  Able to work some lately.  Doing a few things.  Doing some plumbing in a house; started last week.  Taking care of horses again; started one week ago.  Not handling hay everyday.  Went to the dump Saturday with nephews.  Wants to get out and do things.  When gets out gets scared to struggle.  Doing better in the shower; no severe SOB.  Using Spiriva and Advair.  Using nebulizer 2-3 times per day.  Bought new nebulizer and it helps a lot; much quieter.  Sometimes will double dose.  Using rescue inhaler 3-4 times per day depending on activity.  Plans to start Pulmonary Rehab tomorrow.   Has not started Chantix yet due to SOB, diarrhea.   Smoking less than 1 ppd.    2.  Sore throat/Thrush: Nystatin liquid caused horrible diarrhea.  Throat is still irritated and feels sore.  Feels like throat is closed up.  Throat is still sore some; Nystatin did not help at all.  Using Nystatin tid x 2-3 weeks without improvement.  Problems with swallowing; no choking or food getting stuck.  Last EGD 08/2012; history of esophagus stenosis s/p dilatation.    3.  Diarrhea: onset one week ago; having 4-5 stools per day; baseline is one stool per day every morning.  No dark or tarry black stools.  Watery stools.  Called yesterday; advised to stop medication; last Nystatin dose 36 hours ago and stools slowing down.  Stools yesterday 2-3.  One stool this morning solid.  Missed pulmonary rehab Friday and Monday due to feeling poorly.    4.  GERD:  Having reflux/burning in chest since taking Nystatin.  Has not been taking Carafate with Nystatin.    5. Hyperlipidemia:  Non-fasting today; ate honeybun  this morning.  Reports good compliance with Lipitor; good tolerance to Lipitor; good symptom control.  Followed by cardiology every six months.  6.  Glucose Intolerance: due for labs; not watching diet closely; ate a honeybun on the way to office; weight is down five to ten pounds over the past several months.  Review of Systems  Constitutional: Positive for fatigue. Negative for fever, chills, diaphoresis, activity change and appetite change.  HENT: Positive for sore throat and trouble swallowing. Negative for congestion, ear pain, mouth sores, postnasal drip and voice change.   Respiratory: Positive for shortness of breath. Negative for cough, wheezing and stridor.   Cardiovascular: Negative for chest pain, palpitations and leg swelling.  Gastrointestinal: Positive for diarrhea. Negative for nausea, vomiting, abdominal pain and blood in stool.  Endocrine: Negative for polydipsia, polyphagia and polyuria.  Skin: Negative for rash.  Neurological: Positive for headaches. Negative for dizziness, facial asymmetry, light-headedness and numbness.   Past Medical History  Diagnosis Date  . Hyperlipidemia   . COPD (chronic obstructive pulmonary disease)   . Depression   . GERD (gastroesophageal reflux disease)   . Colon polyps 08/2012    colonoscopy; multiple colon polyps; repeat colonoscopy in one year.  . Status post dilation of esophageal narrowing 2000  . Peptic ulcer   . Hypertension   . CARDIOMYOPATHY 03/24/2010  did not tolerate Toprol and Lisinopril.   . Insomnia   . Hemorrhoids   . MI (myocardial infarction)   . Atherosclerosis   . Anxiety   . Headache(784.0)   . Tinea pedis   . Myalgia   . Helicobacter pylori (H. pylori)   . Thyroid dysfunction   . Chest pain   . Back pain   . Hernia     inguinal  . Allergic rhinitis   . Bronchitis     09-14-11  . Chronic airway obstruction, not elsewhere classified   . Coronary artery disease 11/2009    Anterior MI with late  presentation 11/2009. Cath showed a 100% proximal LAD occlusion, 99% mid RCA. Had a PCI and 2 DES to both LAD and RCA. EF was 35%. Nuclear stress test 05/13: prior anterior/inferior infarcts without ischemic, EF 43%, cardiac cath in 02/14: Widely patent stents with no other obstructive disease. EF 40%    Past Surgical History  Procedure Laterality Date  . Penile prosthesis implant    . Esophagogastroduodenoscopy  2008  . Colonoscopy    . Esophageal dilation    . Hernia repair  07/20/2012    L inguinal hernia repair  . Admission  12/20/2012    COPD exacerbation.  [place].  . Esophagogastroduodenoscopy  08/20/2012  . Cardiac catheterization    . Coronary angioplasty with stent placement  09/2009    LAD 3.0 X23 mm Xience DES, RCA: 4.0 X 15 mm Xience DES  . Cardiac catheterization  02/05/13    [facility]  . Cardiac catheterization  10/14    [facility] : patent stents with no change in anatomy. EF: 40$   Allergies  Allergen Reactions  . Wellbutrin [Bupropion] Other (See Comments)    Unknown/"made me feel real funny"   Current Outpatient Prescriptions on File Prior to Visit  Medication Sig Dispense Refill  . albuterol (PROVENTIL) (2.5 MG/3ML) 0.083% nebulizer solution Take 3 mLs (2.5 mg total) by nebulization every 6 (six) hours as needed for wheezing or shortness of breath.  75 mL  12  . albuterol (VENTOLIN HFA) 108 (90 BASE) MCG/ACT inhaler Inhale 2 puffs into the lungs every 6 (six) hours as needed. For wheezing  54 g  3  . aspirin 81 MG tablet Take 81 mg by mouth daily.        [name] atorvastatin (LIPITOR) 20 MG tablet Take 1 tablet (20 mg total) by mouth daily.  90 tablet  3  . clopidogrel (PLAVIX) 75 MG tablet Take 1 tablet (75 mg total) by mouth daily.  90 tablet  3  . esomeprazole (NEXIUM) 40 MG capsule Take 1 capsule (40 mg total) by mouth 2 (two) times daily before a meal.  180 capsule  3  . fluticasone (FLONASE) 50 MCG/ACT nasal spray Place 2 sprays into both nostrils daily.  48 g  3  .  Fluticasone-Salmeterol (ADVAIR) 500-50 MCG/DOSE AEPB Inhale 1 puff into the lungs 2 (two) times daily.  180 each  3  . levalbuterol (XOPENEX) 1.25 MG/3ML nebulizer solution Take 1.25 mg by nebulization 3 (three) times daily.  270 mL  11  . LORazepam (ATIVAN) 0.5 MG tablet Take 1 tablet (0.5 mg total) by mouth as needed.  30 tablet  3  . losartan (COZAAR) 25 MG tablet Take 1 tablet (25 mg total) by mouth daily.  90 tablet  4  . Nebulizers (NEBULIZER COMPRESSOR) MISC Use to inhale albuterol solution every 6 hrs as needed. Dx codes: F9807496, 491.21, 466.0.  1  each  0  . nitroGLYCERIN (NITROSTAT) 0.4 MG SL tablet Place 1 tablet (0.4 mg total) under the tongue every 5 (five) minutes as needed.  25 tablet  3  . Pseudoephedrine-DM-GG (ROBITUSSIN COLD & COUGH PO) As directed per bottle when needed for cough      . sertraline (ZOLOFT) 100 MG tablet Take 100 mg by mouth daily.      [name] tiotropium (SPIRIVA) 18 MCG inhalation capsule Place 1 capsule (18 mcg total) into inhaler and inhale daily.  90 capsule  3  . sucralfate (CARAFATE) 1 GM/10ML suspension Take 10 mLs (1 g total) by mouth 4 (four) times daily.  420 mL  3  . varenicline (CHANTIX CONTINUING MONTH PAK) 1 MG tablet Take 1 tablet (1 mg total) by mouth 2 (two) times daily.  60 tablet  2  . varenicline (CHANTIX STARTING MONTH PAK) 0.5 MG X 11 & 1 MG X 42 tablet Take one 0.5 mg tablet by mouth once daily for 3 days, then increase to one 0.5 mg tablet twice daily for 4 days, then increase to one 1 mg tablet twice daily.  53 tablet  0   Current Facility-Administered Medications on File Prior to Visit  Medication Dose Route Frequency Provider Last Rate Last Dose  . albuterol (PROVENTIL) (2.5 MG/3ML) 0.083% nebulizer solution 2.5 mg  2.5 mg Nebulization Once [name], MD      . albuterol (PROVENTIL) (2.5 MG/3ML) 0.083% nebulizer solution 2.5 mg  2.5 mg Nebulization Once [name], MD       History   Social History  . Marital Status: Married     Spouse Name: N/A    Number of Children: 28  . 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 Smoking status: Current Every Day Smoker -- 2.00 packs/day for 42 years    Types: Cigarettes  . Smokeless tobacco: Never Used  . Alcohol Use: No  . Drug Use: No  . Sexual Activity: Yes   Other Topics Concern  . Not on file   Social History Narrative   Marital status: married x 31 years; happily married.      Children: 4 children;  1 stepchild and 15 grandchildren; no great grandchildren.      Lives: with wife; 2 dogs, 1 cat.      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      Tobacco: 1-2 ppd x 45 years      Alcohol: none      Drugs: none      Exercise: no formal exercise; physically demanding job; owns horses.      Seatbelts:  100%      Guns: one loaded unsecured gun in the home.      Sunscreen: none       Objective:   Physical Exam  Nursing note and vitals reviewed. Constitutional: He is oriented to person, place, and time. He appears well-developed and well-nourished. No distress.  HENT:  Head: Normocephalic and atraumatic.  Right Ear: External ear normal.  Left Ear: External ear normal.  Nose: Nose normal.  Mouth/Throat: Mucous membranes are normal. Posterior oropharyngeal erythema present. No oropharyngeal exudate, posterior oropharyngeal edema or tonsillar abscesses.  Neck: Normal range of motion. Neck supple. No JVD present. No thyromegaly present.  Cardiovascular: Normal rate, regular rhythm and normal heart sounds.  Exam reveals no gallop and no friction rub.   No  murmur heard. Pulmonary/Chest: Effort normal and breath sounds normal. No respiratory distress. He has no wheezes. He has no rales.  Distant breath sounds throughout.  Abdominal: Soft. Bowel sounds are normal. He exhibits no distension and no mass. There is tenderness. There is no rebound and no guarding.  Mild TTP epigastric  region; no g/r.  Lymphadenopathy:    He has no cervical adenopathy.  Neurological: He is alert and oriented to person, place, and time.  Skin: Skin is warm and dry. No rash noted. He is not diaphoretic.  Psychiatric: He has a normal mood and affect. His behavior is normal.   THROAT CULTURE OBTAINED.    Assessment & Plan:  Pure hypercholesterolemia - Plan: CBC with Differential, COMPLETE METABOLIC PANEL WITH GFR, Lipid panel  Other abnormal glucose - Plan: CBC with Differential, COMPLETE METABOLIC PANEL WITH GFR, Hemoglobin A1c  Chronic airway obstruction, not elsewhere classified - Plan: Ambulatory referral to Pulmonology  Acute pharyngitis - Plan: Culture, Group A Strep  GERD  CAD, NATIVE VESSEL   1.  Hypercholesterolemia: controlled; obtain labs; continue current medication; goal LDL< 70. 2.  Glucose intolerance: stable; obtain labs; recommend dietary modification. 3.  COPD: improved since visit three weeks ago; has been on prolonged prednisone taper; thus, will wean prednisone further over the upcoming week.  To start pulmonary rehab this week; working on smoking cessation and to start Chantix.  Desires second opinion from different pulmonologist who may be more available. 4.  Pharyngitis: persistent; no improvement with Nystatin swish and swallow; throat culture obtained. Start Carafate. If no improvement with Carafate and if throat culture negative, refer to ENT. 5.  GERD: worsening over past week with diarrhea secondary to Nystatin; start Carafate; continue Nexium. 6.  CAD: stable; followed by cardiology every six months; no acute issues. 7. Tobacco abuse: contemplative; has decreased intake to 1 ppd; to start Chantix in upcoming week.  Meds ordered this encounter  Medications  . predniSONE (DELTASONE) 2.5 MG tablet    Sig: Take two tablets daily x 3 days then one tablet daily x 3 days then 1/2 tablet daily x 3 days then stop    Dispense:  12 tablet    Refill:  0   [name], M.D.  [facility] [address] [place], [place]  [address] [phone] phone [phone] fax

## 2014-01-09 LAB — CBC WITH DIFFERENTIAL/PLATELET
Basophils Absolute: 0 10*3/uL (ref 0.0–0.1)
Basophils Relative: 0 % (ref 0–1)
EOS ABS: 0 10*3/uL (ref 0.0–0.7)
EOS PCT: 0 % (ref 0–5)
HEMATOCRIT: 46.2 % (ref 39.0–52.0)
Hemoglobin: 16 g/dL (ref 13.0–17.0)
Lymphocytes Relative: 17 % (ref 12–46)
Lymphs Abs: 1.7 10*3/uL (ref 0.7–4.0)
MCH: 29.1 pg (ref 26.0–34.0)
MCHC: 34.6 g/dL (ref 30.0–36.0)
MCV: 84 fL (ref 78.0–100.0)
Monocytes Absolute: 1 10*3/uL (ref 0.1–1.0)
Monocytes Relative: 10 % (ref 3–12)
Neutro Abs: 7.5 10*3/uL (ref 1.7–7.7)
Neutrophils Relative %: 73 % (ref 43–77)
PLATELETS: 214 10*3/uL (ref 150–400)
RBC: 5.5 MIL/uL (ref 4.22–5.81)
RDW: 14.3 % (ref 11.5–15.5)
WBC: 10.2 10*3/uL (ref 4.0–10.5)

## 2014-01-09 LAB — COMPLETE METABOLIC PANEL WITH GFR
ALT: 18 U/L (ref 0–53)
AST: 14 U/L (ref 0–37)
Albumin: 4.4 g/dL (ref 3.5–5.2)
Alkaline Phosphatase: 76 U/L (ref 39–117)
BILIRUBIN TOTAL: 0.4 mg/dL (ref 0.3–1.2)
BUN: 9 mg/dL (ref 6–23)
CALCIUM: 10 mg/dL (ref 8.4–10.5)
CHLORIDE: 100 meq/L (ref 96–112)
CO2: 26 meq/L (ref 19–32)
Creat: 0.77 mg/dL (ref 0.50–1.35)
GFR, Est African American: >89 mL/min
GLUCOSE: 92 mg/dL (ref 70–99)
Potassium: 3.7 mEq/L (ref 3.5–5.3)
Sodium: 140 mEq/L (ref 135–145)
Total Protein: 6.6 g/dL (ref 6.0–8.3)

## 2014-01-09 LAB — LIPID PANEL
CHOLESTEROL: 183 mg/dL (ref 0–200)
HDL: 77 mg/dL (ref >39)
LDL CALC: 79 mg/dL (ref 0–99)
TRIGLYCERIDES: 136 mg/dL (ref <150)
Total CHOL/HDL Ratio: 2.4 Ratio
VLDL: 27 mg/dL (ref 0–40)

## 2014-01-09 LAB — HEMOGLOBIN A1C
HEMOGLOBIN A1C: 6.5 % — AB (ref <5.7)
Mean Plasma Glucose: 140 mg/dL — ABNORMAL HIGH (ref <117)

## 2014-01-10 LAB — CULTURE, GROUP A STREP: Organism ID, Bacteria: NORMAL

## 2014-01-16 ENCOUNTER — Telehealth: Payer: Self-pay

## 2014-01-16 NOTE — Telephone Encounter (Signed)
SMITH - Pt needs a refill on his advair and spiriva.   This needs to be sent in to [facility]. [phone]

## 2014-01-17 NOTE — Telephone Encounter (Signed)
LMOM that a years worth of RFs was sent to [facility] in Nov. Asked pt to check w/them and CB to let me know if RFs are needed.

## 2014-01-20 ENCOUNTER — Encounter: Payer: Self-pay | Admitting: Pulmonary Disease

## 2014-01-23 ENCOUNTER — Encounter: Payer: Self-pay | Admitting: Pulmonary Disease

## 2014-02-17 ENCOUNTER — Encounter: Payer: Self-pay | Admitting: Pulmonary Disease

## 2014-02-19 ENCOUNTER — Ambulatory Visit: Payer: BC Managed Care – PPO | Admitting: Family Medicine

## 2014-02-26 ENCOUNTER — Ambulatory Visit: Payer: BC Managed Care – PPO | Admitting: Pulmonary Disease

## 2014-02-26 NOTE — Progress Notes (Signed)
No show

## 2014-03-07 ENCOUNTER — Other Ambulatory Visit: Payer: Self-pay | Admitting: *Deleted

## 2014-03-07 ENCOUNTER — Other Ambulatory Visit: Payer: Self-pay | Admitting: Cardiovascular Disease

## 2014-03-07 DIAGNOSIS — R0789 Other chest pain: Secondary | ICD-10-CM

## 2014-03-07 DIAGNOSIS — I5022 Chronic systolic (congestive) heart failure: Secondary | ICD-10-CM

## 2014-03-07 MED ORDER — LOSARTAN POTASSIUM 25 MG PO TABS: 25.0000 mg | ORAL_TABLET | Freq: Every day | ORAL | Status: DC

## 2014-03-07 MED ORDER — CLOPIDOGREL BISULFATE 75 MG PO TABS: 75.0000 mg | ORAL_TABLET | Freq: Every day | ORAL | Status: DC

## 2014-03-07 NOTE — Telephone Encounter (Signed)
Requested Prescriptions   Signed Prescriptions Disp Refills  . clopidogrel (PLAVIX) 75 MG tablet 90 tablet 3    Sig: Take 1 tablet (75 mg total) by mouth daily.    Authorizing Provider: Kathlyn Sacramento A    Ordering User: Britt Bottom

## 2014-03-19 ENCOUNTER — Encounter: Payer: Self-pay | Admitting: Family Medicine

## 2014-03-19 ENCOUNTER — Ambulatory Visit (INDEPENDENT_AMBULATORY_CARE_PROVIDER_SITE_OTHER): Payer: BC Managed Care – PPO | Admitting: Family Medicine

## 2014-03-19 VITALS — BP 120/54 | HR 92 | Temp 98.0°F | Resp 20 | Ht 69.0 in | Wt 161.2 lb

## 2014-03-19 DIAGNOSIS — J309 Allergic rhinitis, unspecified: Secondary | ICD-10-CM

## 2014-03-19 DIAGNOSIS — F17200 Nicotine dependence, unspecified, uncomplicated: Secondary | ICD-10-CM

## 2014-03-19 DIAGNOSIS — J441 Chronic obstructive pulmonary disease with (acute) exacerbation: Secondary | ICD-10-CM

## 2014-03-19 MED ORDER — HYDROCOD POLST-CHLORPHEN POLST 10-8 MG/5ML PO LQCR: 5.0000 mL | Freq: Two times a day (BID) | ORAL | Status: DC | PRN

## 2014-03-19 MED ORDER — PREDNISONE 20 MG PO TABS: ORAL_TABLET | ORAL | Status: DC

## 2014-03-19 MED ORDER — LORATADINE 10 MG PO TABS: 10.0000 mg | ORAL_TABLET | Freq: Every day | ORAL | Status: DC

## 2014-03-19 MED ORDER — NICOTINE 10 MG IN INHA: 1.0000 | RESPIRATORY_TRACT | Status: DC | PRN

## 2014-03-19 NOTE — Progress Notes (Signed)
Subjective:  This chart was scribed for [name], MD by Stacy Gardner, [facility] Scribe. The patient was seen in room and the patient's care was started at 11:54 AM.   Patient ID: Leslie Sims, male    DOB: 1951/03/08, 63 y.o.   MRN: [id number]  03/19/2014  Follow-up   HPI HPI Comments: Leslie Sims is a 63 y.o. male with a hx of COPD and chronic airway obstruction, who arrives to the [facility] follow up of his breathing problems after cleaning floors three days ago. He was not wearing a mask and reports opening windows.  Pt reports having sneezing, cough, wheezing, watery eyes and rhinorrhea. Denies chills, fever, myalgia, and diaphoresis. Pt is taking his medications as instructed. He is using his rescue inhaler more frequently. Pt is using his nebulizer twice a day and had one three hours PTA.  Denies chest pain.  Pt has followed up with a pulmonologist. He goes to the pulmonary rehab three times a week and exercises reguarly. He has seen significant changes and loves going there. His lowest oxygen saturation is 92%.   Pt goes to an orthopedist for bilateral knee pain.  Pt has He admits to smoking 1.5 pk of cigarettes a day. He requests more information about the nicotine inhaler and other methods of smoking cessation.  He is eating normally and denies any significant weight loss.   Review of Systems  Constitutional: Negative for fever, chills, diaphoresis, activity change, appetite change, fatigue and unexpected weight change.  HENT: Positive for congestion, postnasal drip, rhinorrhea, sneezing and sore throat.   Eyes: Positive for discharge (watery). Negative for visual disturbance.  Respiratory: Positive for cough, shortness of breath and wheezing.   Cardiovascular: Negative for chest pain, palpitations and leg swelling.  Gastrointestinal: Negative for nausea, vomiting and diarrhea.  Endocrine: Negative for cold  intolerance, heat intolerance, polydipsia, polyphagia and polyuria.  Musculoskeletal: Positive for arthralgias. Negative for myalgias.  Skin: Negative for rash.  Neurological: Negative for dizziness, tremors, seizures, syncope, facial asymmetry, speech difficulty, weakness, light-headedness, numbness and headaches.  All other systems reviewed and are negative.   Past Medical History  Diagnosis Date  . Hyperlipidemia   . COPD (chronic obstructive pulmonary disease)   . Depression   . GERD (gastroesophageal reflux disease)   . Colon polyps 08/2012    colonoscopy; multiple colon polyps; repeat colonoscopy in one year.  . Status post dilation of esophageal narrowing 2000  . Peptic ulcer   . Hypertension   . CARDIOMYOPATHY 03/24/2010    did not tolerate Toprol and Lisinopril.   . Insomnia   . Hemorrhoids   . MI (myocardial infarction)   . Atherosclerosis   . Anxiety   . Headache(784.0)   . Tinea pedis   . Myalgia   . Helicobacter pylori (H. pylori)   . Thyroid dysfunction   . Chest pain   . Back pain   . Hernia     inguinal  . Allergic rhinitis   . Bronchitis     09-14-11  . Chronic airway obstruction, not elsewhere classified   . Coronary artery disease 11/2009    Anterior MI with late presentation 11/2009. Cath showed a 100% proximal LAD occlusion, 99% mid RCA. Had a PCI and 2 DES to both LAD and RCA. EF was 35%. Nuclear stress test 05/13: prior anterior/inferior infarcts without ischemic, EF 43%, cardiac cath in 02/14: Widely patent stents with no other obstructive disease. EF  40%    Allergies  Allergen Reactions  . Wellbutrin [Bupropion] Other (See Comments)    Unknown/"made me feel real funny"   Current Outpatient Prescriptions  Medication Sig Dispense Refill  . albuterol (PROVENTIL) (2.5 MG/3ML) 0.083% nebulizer solution Take 3 mLs (2.5 mg total) by nebulization every 6 (six) hours as needed for wheezing or shortness of breath. 75 mL 12  . aspirin 81 MG tablet Take 81 mg  by mouth daily.      . fluticasone (FLONASE) 50 MCG/ACT nasal spray Place 2 sprays into both nostrils daily. 48 g 3  . levalbuterol (XOPENEX) 1.25 MG/3ML nebulizer solution Take 1.25 mg by nebulization 3 (three) times daily. 270 mL 11  . LORazepam (ATIVAN) 0.5 MG tablet Take 1 tablet (0.5 mg total) by mouth as needed. 30 tablet 3  . Nebulizers (NEBULIZER COMPRESSOR) MISC Use to inhale albuterol solution every 6 hrs as needed. Dx codes: 993, 491.21, 466.0. 1 each 0  . PRESCRIPTION MEDICATION Daliresp  300 mcg taking daily    . tiotropium (SPIRIVA) 18 MCG inhalation capsule Place 1 capsule (18 mcg total) into inhaler and inhale daily. 90 capsule 3  . ADVAIR DISKUS 500-50 MCG/DOSE AEPB USE 2 INHALATIONS INTO THE LUNGS  DAILY (DISCARD 30 DAYS AFTER   OPENING) 3 each 1  . albuterol (VENTOLIN HFA) 108 (90 BASE) MCG/ACT inhaler Inhale 2 puffs into the lungs every 6 (six) hours as needed. For wheezing 54 g 3  . atorvastatin (LIPITOR) 20 MG tablet TAKE 1 TABLET DAILY 90 tablet 3  . chlorpheniramine-HYDROcodone (TUSSIONEX) 10-8 MG/5ML LQCR Take 5 mLs by mouth every 12 (twelve) hours as needed for cough. 180 mL 0  . clopidogrel (PLAVIX) 75 MG tablet Take 1 tablet (75 mg total) by mouth daily. 7 tablet 1  . doxycycline (VIBRAMYCIN) 100 MG capsule Take 1 capsule (100 mg total) by mouth 2 (two) times daily. 20 capsule 0  . esomeprazole (NEXIUM) 40 MG capsule Take 40 mg by mouth daily at 12 noon.    . hydrocortisone (PROCTOCORT) 90 MG/DOSE rectal foam Place 1 applicator rectally 2 (two) times daily. 15 g 3  . losartan (COZAAR) 25 MG tablet Take 1 tablet (25 mg total) by mouth daily. 7 tablet 1  . nicotine (NICOTROL) 10 MG inhaler Inhale 1 cartridge (1 continuous puffing total) into the lungs as needed for smoking cessation. Use as directed 42 each 3  . nitroGLYCERIN (NITROSTAT) 0.4 MG SL tablet Place 1 tablet (0.4 mg total) under the tongue every 5 (five) minutes as needed. 25 tablet 3  . predniSONE (DELTASONE)  20 MG tablet Two tablets daily x 5 days then one tablet daily x 5 days 15 tablet 0  . sertraline (ZOLOFT) 100 MG tablet Take 2 tablets (200 mg total) by mouth daily. 60 tablet 11  . terbinafine (LAMISIL) 1 % cream Apply 1 application topically 2 (two) times daily.     Current Facility-Administered Medications  Medication Dose Route Frequency Provider Last Rate Last Dose  . albuterol (PROVENTIL) (2.5 MG/3ML) 0.083% nebulizer solution 2.5 mg  2.5 mg Nebulization Once [name], MD      . albuterol (PROVENTIL) (2.5 MG/3ML) 0.083% nebulizer solution 2.5 mg  2.5 mg Nebulization Once [name], MD           Objective:    BP 120/54 mmHg  Pulse 92  Temp(Src) 98 F (36.7 C) (Oral)  Resp 20  Ht 5\' 9"  (1.753 m)  Wt 161 lb 3.2 oz (73.12  kg)  BMI 23.79 kg/m2  SpO2 96% Physical Exam  Constitutional: He is oriented to person, place, and time. He appears well-developed and well-nourished. No distress.  HENT:  Head: Normocephalic and atraumatic.  Right Ear: External ear normal.  Left Ear: External ear normal.  Nose: Nose normal.  Mouth/Throat: Oropharynx is clear and moist. No oropharyngeal exudate.  Post nasal drainage   Eyes: Conjunctivae and EOM are normal. Pupils are equal, round, and reactive to light.  Neck: Normal range of motion. Neck supple. Carotid bruit is not present. No thyromegaly present.  Cardiovascular: Normal rate, regular rhythm, normal heart sounds and intact distal pulses.  Exam reveals no gallop and no friction rub.   No murmur heard. Pulmonary/Chest: Effort normal. No respiratory distress. He has wheezes (diffuse bilateral wheezes throughout lungs). He has no rales.  Abdominal: Soft. Bowel sounds are normal. He exhibits no distension and no mass. There is no tenderness. There is no rebound and no guarding.  Lymphadenopathy:    He has no cervical adenopathy.  Neurological: He is alert and oriented to person, place, and time. No cranial nerve deficit.  Skin: Skin  is warm and dry. No rash noted. He is not diaphoretic.  Psychiatric: He has a normal mood and affect. His behavior is normal.  Nursing note and vitals reviewed.   ALBUTEROL/ATROVENT NEBULIZER ADMINISTERED.    Assessment & Plan:  COPD exacerbation - Plan: PR INHAL RX, AIRWAY OBST/DX SPUTUM INDUCT  Allergic rhinitis, cause unspecified  SMOKER   1. COPD Exacerbation: New.  S/p nebulizer in office; rx for Prednisone taper and Tussionex provided.  RTC for acute worsening; increase nebulizer use at home to qid.  2.  Allergic Rhinitis: uncontrolled; rx for Claritin provided. 3.  Tobacco abuse: contemplative; rx for Nicotrol inhaler provided.   Meds ordered this encounter  Medications  . PRESCRIPTION MEDICATION    Sig: Daliresp  300 mcg taking daily  . DISCONTD: predniSONE (DELTASONE) 20 MG tablet    Sig: Take three tablets daily x 2 days then two tablets daily x 5 days then 1 tablet daily x 5 days then stop    Dispense:  21 tablet    Refill:  0  . DISCONTD: chlorpheniramine-HYDROcodone (TUSSIONEX) 10-8 MG/5ML LQCR    Sig: Take 5 mLs by mouth every 12 (twelve) hours as needed for cough.    Dispense:  180 mL    Refill:  0  . DISCONTD: loratadine (CLARITIN) 10 MG tablet    Sig: Take 1 tablet (10 mg total) by mouth daily.    Dispense:  30 tablet    Refill:  2  . nicotine (NICOTROL) 10 MG inhaler    Sig: Inhale 1 cartridge (1 continuous puffing total) into the lungs as needed for smoking cessation. Use as directed    Dispense:  42 each    Refill:  3    Return in about 3 months (around 06/18/2014) for recheck.   I personally performed the services described in this documentation, which was scribed in my presence.  The recorded information has been reviewed and is accurate.  [name], M.D.  [facility] [address] [place], [place]  [address] [phone] phone [phone] fax

## 2014-03-20 ENCOUNTER — Encounter: Payer: Self-pay | Admitting: Pulmonary Disease

## 2014-03-25 ENCOUNTER — Ambulatory Visit: Payer: Self-pay

## 2014-03-25 IMAGING — CR DG CHEST 2V
1 series · 3 of 3 positions shown · non-contrast
Comparison: [DATE].

CLINICAL DATA: Shortness of breath.

EXAM:
CHEST  2 VIEW

[Series 1: pa · 0.17mm/px · 3 of 3 slices shown]
[im 1/3]
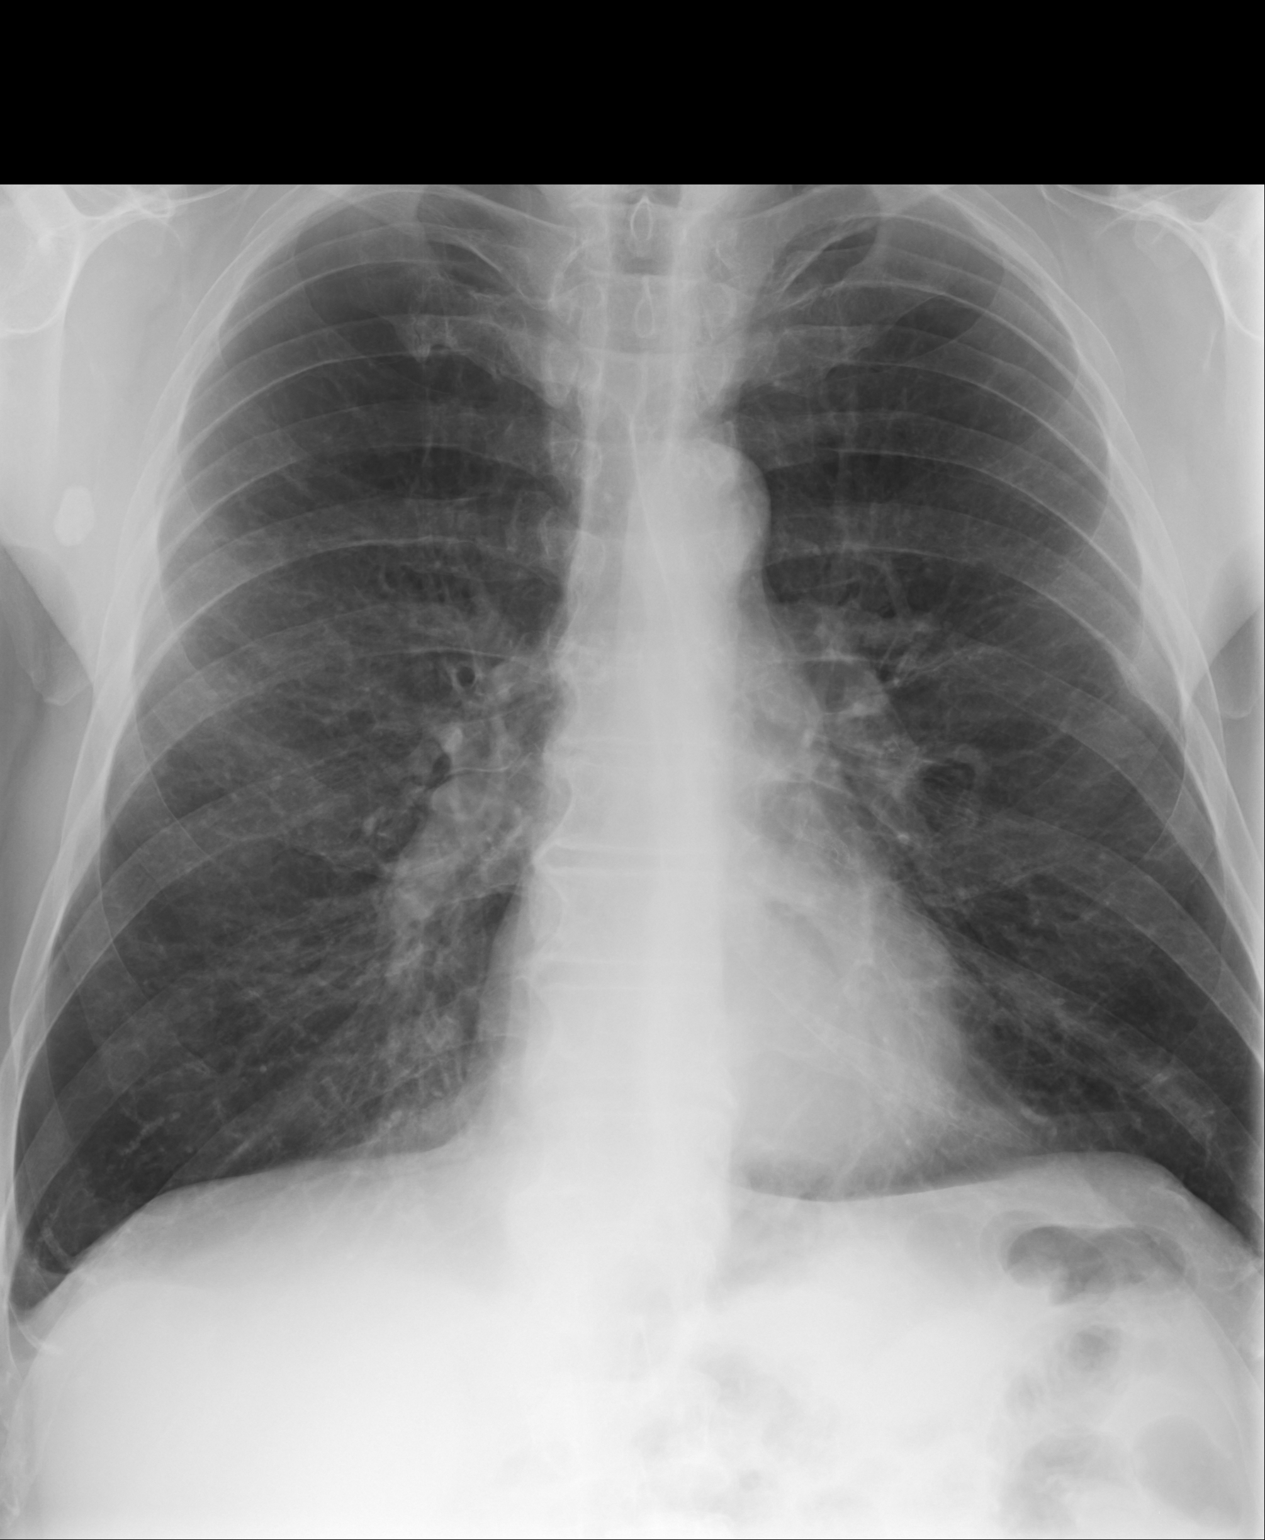
[im 2/3]
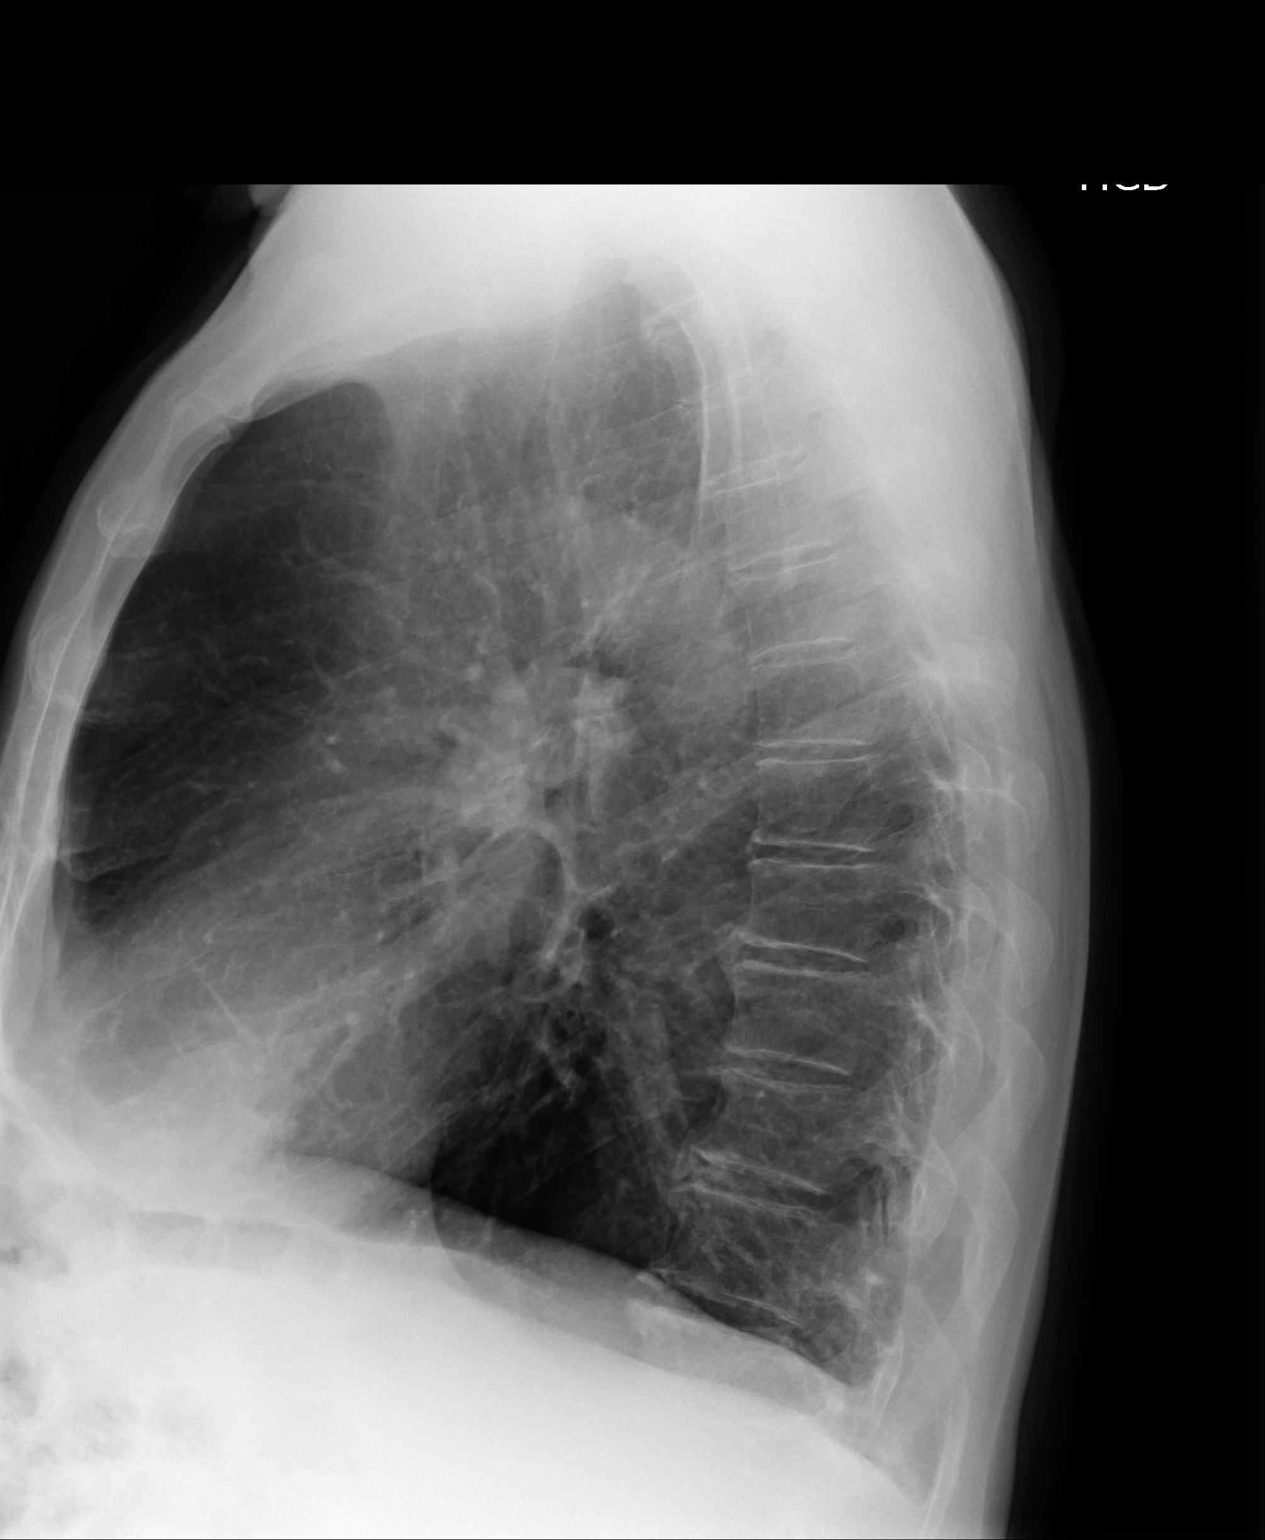
[im 3/3]
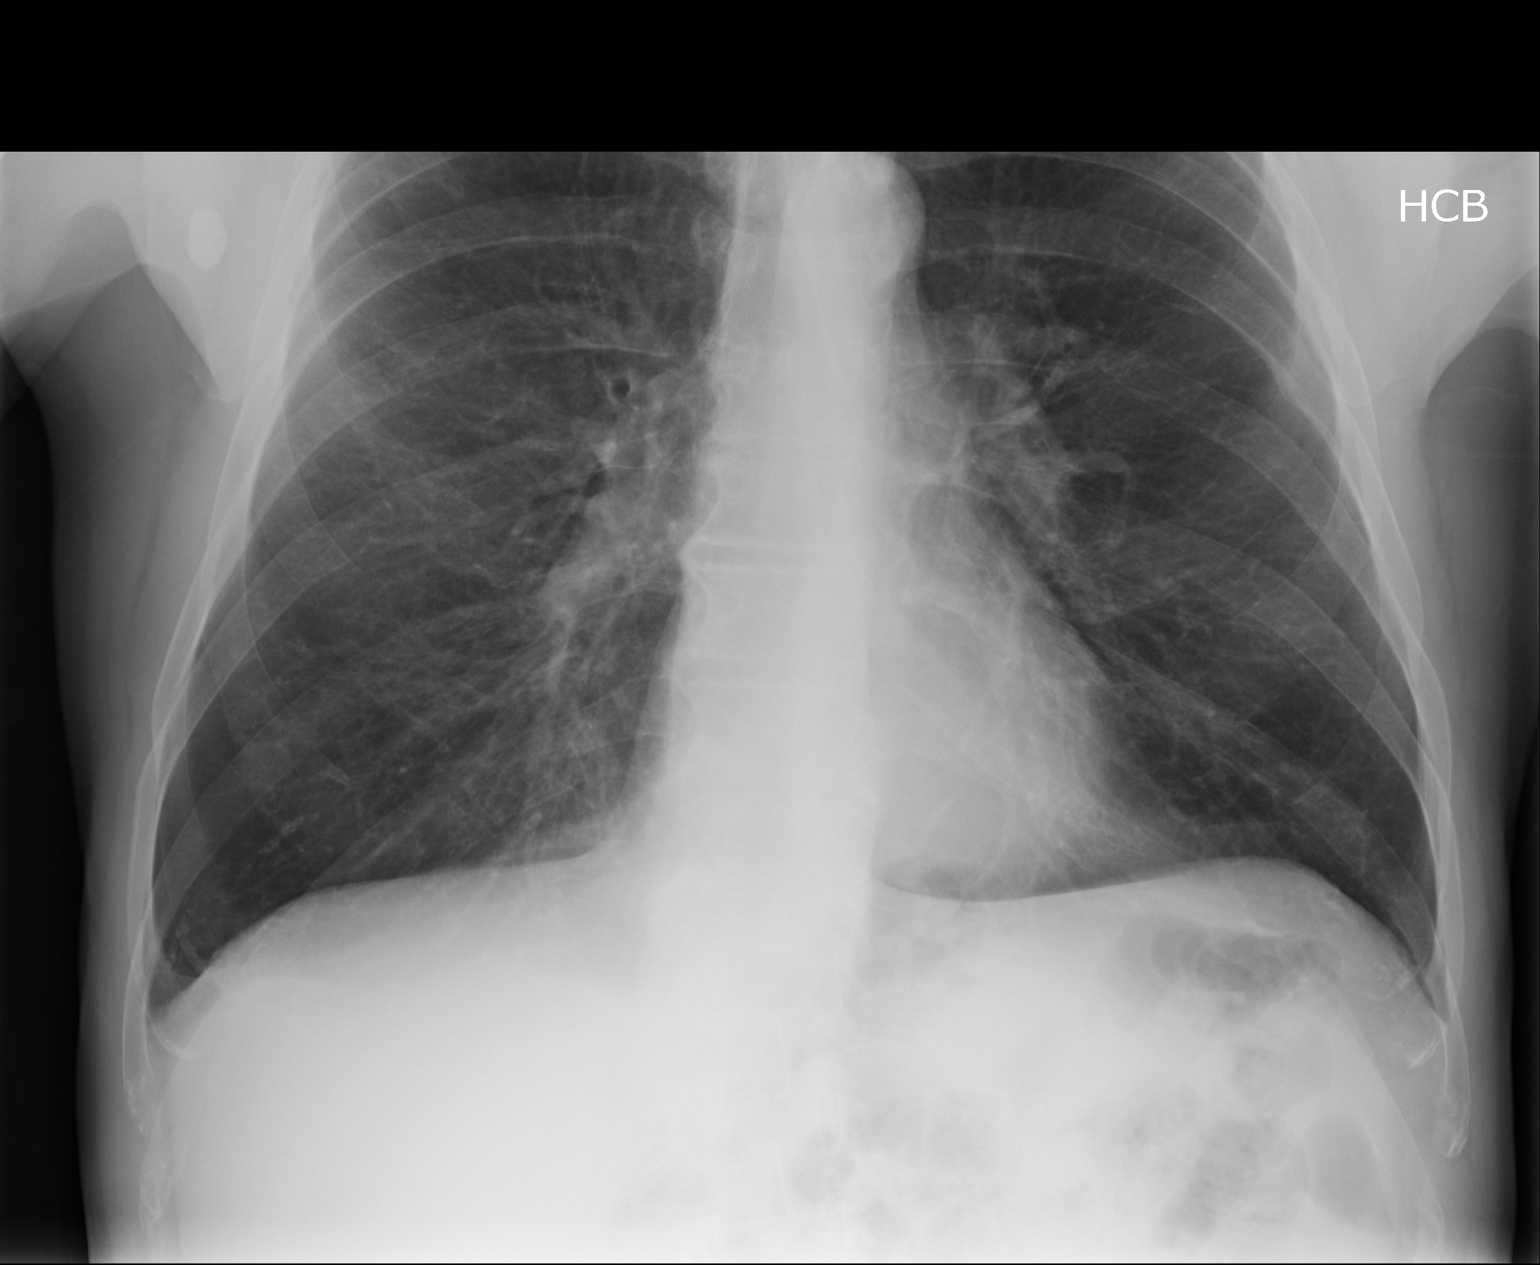

[3 of 3 positions shown; findings below may reference images not displayed]

FINDINGS: Normal cardiomediastinal silhouette. Clear lung fields.
Hyperinflation. Degenerative change thoracic spine. No infiltrate or
pulmonary nodule. Rounded ovoid density in the axillary region on
the right could represent calcified lymph node or radiopaque foreign
body.
IMPRESSION: No active cardiopulmonary disease.  Stable chest.

## 2014-04-17 ENCOUNTER — Other Ambulatory Visit: Payer: Self-pay | Admitting: Family Medicine

## 2014-04-17 DIAGNOSIS — I10 Essential (primary) hypertension: Secondary | ICD-10-CM

## 2014-04-17 DIAGNOSIS — R079 Chest pain, unspecified: Secondary | ICD-10-CM

## 2014-04-17 MED ORDER — NITROGLYCERIN 0.4 MG SL SUBL: 0.4000 mg | SUBLINGUAL_TABLET | SUBLINGUAL | Status: DC | PRN

## 2014-04-19 ENCOUNTER — Encounter: Payer: Self-pay | Admitting: Pulmonary Disease

## 2014-05-06 ENCOUNTER — Other Ambulatory Visit: Payer: Self-pay | Admitting: Family Medicine

## 2014-05-06 ENCOUNTER — Ambulatory Visit (INDEPENDENT_AMBULATORY_CARE_PROVIDER_SITE_OTHER): Payer: BC Managed Care – PPO | Admitting: Cardiovascular Disease

## 2014-05-06 ENCOUNTER — Encounter: Payer: Self-pay | Admitting: Cardiovascular Disease

## 2014-05-06 ENCOUNTER — Other Ambulatory Visit: Payer: Self-pay | Admitting: Cardiovascular Disease

## 2014-05-06 VITALS — BP 138/78 | HR 84 | Ht 70.0 in | Wt 160.5 lb

## 2014-05-06 DIAGNOSIS — R0602 Shortness of breath: Secondary | ICD-10-CM

## 2014-05-06 DIAGNOSIS — I428 Other cardiomyopathies: Secondary | ICD-10-CM

## 2014-05-06 DIAGNOSIS — E785 Hyperlipidemia, unspecified: Secondary | ICD-10-CM

## 2014-05-06 DIAGNOSIS — I2510 Atherosclerotic heart disease of native coronary artery without angina pectoris: Secondary | ICD-10-CM

## 2014-05-06 DIAGNOSIS — F17200 Nicotine dependence, unspecified, uncomplicated: Secondary | ICD-10-CM

## 2014-05-06 NOTE — Assessment & Plan Note (Signed)
Lab Results  Component Value Date   CHOL 183 01/08/2014   HDL 77 01/08/2014   LDLCALC 79 01/08/2014   TRIG 136 01/08/2014   CHOLHDL 2.4 01/08/2014   Continue treatment with atorvastatin. Lipid profile was good. Liver profile was normal.

## 2014-05-06 NOTE — Assessment & Plan Note (Signed)
Ischemic cardiomyopathy with an ejection fraction of 40%. No clinical heart failure. Continue losartan. He did not tolerate treatment with a beta blocker due to worsening dyspnea and bradycardia.

## 2014-05-06 NOTE — Assessment & Plan Note (Signed)
He is still trying quit smoking but has not been successful.

## 2014-05-06 NOTE — Progress Notes (Signed)
Primary care physician: Dr. [name]  HPI  This is a 63 year old male who is here today for a followup visit. He has known history of coronary artery disease status post anterior myocardial infarction in December of 2010. His LAD was occluded and RCA had a 99% stenosis. He had an angioplasty and drug-eluting stent placement to both LAD and RCA at that time. 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 He is trying to quit.  Cardiac catheterization in 02/14  showed widely patent stents in the LAD and RCA with no evidence of other obstructive disease. Ejection fraction was 40% with mildly elevated left ventricular end-diastolic pressure. He has been doing reasonably well and denies any chest pain. No worsening dyspnea. He is attending pulmonary rehabilitation with good improvement. He is still trying to quit smoking.    Allergies  Allergen Reactions  . Wellbutrin [Bupropion] Other (See Comments)    Unknown/"made me feel real funny"     Current Outpatient Prescriptions on File Prior to Visit  Medication Sig Dispense Refill  . albuterol (PROVENTIL) (2.5 MG/3ML) 0.083% nebulizer solution Take 3 mLs (2.5 mg total) by nebulization every 6 (six) hours as needed for wheezing or shortness of breath.  75 mL  12  . albuterol (VENTOLIN HFA) 108 (90 BASE) MCG/ACT inhaler Inhale 2 puffs into the lungs every 6 (six) hours as needed. For wheezing  54 g  3  . aspirin 81 MG tablet Take 81 mg by mouth daily.        [name] atorvastatin (LIPITOR) 20 MG tablet Take 1 tablet (20 mg total) by mouth daily.  90 tablet  3  . chlorpheniramine-HYDROcodone (TUSSIONEX) 10-8 MG/5ML LQCR Take 5 mLs by mouth every 12 (twelve) hours as needed for cough.  180 mL  0  . clopidogrel (PLAVIX) 75 MG tablet Take 1 tablet (75 mg total) by mouth daily.  90 tablet  3  . fluticasone (FLONASE) 50 MCG/ACT nasal spray Place 2 sprays into both nostrils daily.  48 g  3  . Fluticasone-Salmeterol (ADVAIR) 500-50 MCG/DOSE AEPB  Inhale 1 puff into the lungs 2 (two) times daily.  180 each  3  . levalbuterol (XOPENEX) 1.25 MG/3ML nebulizer solution Take 1.25 mg by nebulization 3 (three) times daily.  270 mL  11  . LORazepam (ATIVAN) 0.5 MG tablet Take 1 tablet (0.5 mg total) by mouth as needed.  30 tablet  3  . losartan (COZAAR) 25 MG tablet Take 1 tablet (25 mg total) by mouth daily.  14 tablet  4  . Nebulizers (NEBULIZER COMPRESSOR) MISC Use to inhale albuterol solution every 6 hrs as needed. Dx codes: 496, 491.21, 466.0.  1 each  0  . nicotine (NICOTROL) 10 MG inhaler Inhale 1 cartridge (1 continuous puffing total) into the lungs as needed for smoking cessation. Use as directed  42 each  3  . nitroGLYCERIN (NITROSTAT) 0.4 MG SL tablet Place 1 tablet (0.4 mg total) under the tongue every 5 (five) minutes as needed.  25 tablet  3  . PRESCRIPTION MEDICATION Daliresp  300 mcg taking daily      . Pseudoephedrine-DM-GG (ROBITUSSIN COLD & COUGH PO) As directed per bottle when needed for cough      . sertraline (ZOLOFT) 100 MG tablet Take 100 mg by mouth daily.      [name] tiotropium (SPIRIVA) 18 MCG inhalation capsule Place 1 capsule (18 mcg total) into inhaler and inhale daily.  90 capsule  3  Current Facility-Administered Medications on File Prior to Visit  Medication Dose Route Frequency Provider Last Rate Last Dose  . albuterol (PROVENTIL) (2.5 MG/3ML) 0.083% nebulizer solution 2.5 mg  2.5 mg Nebulization Once [name], MD      . albuterol (PROVENTIL) (2.5 MG/3ML) 0.083% nebulizer solution 2.5 mg  2.5 mg Nebulization Once [name], MD         Past Medical History  Diagnosis Date  . Hyperlipidemia   . COPD (chronic obstructive pulmonary disease)   . Depression   . GERD (gastroesophageal reflux disease)   . Colon polyps 08/2012    colonoscopy; multiple colon polyps; repeat colonoscopy in one year.  . Status post dilation of esophageal narrowing 2000  . Peptic ulcer   . Hypertension   . CARDIOMYOPATHY  03/24/2010    did not tolerate Toprol and Lisinopril.   . Insomnia   . Hemorrhoids   . MI (myocardial infarction)   . Atherosclerosis   . Anxiety   . Headache(784.0)   . Tinea pedis   . Myalgia   . Helicobacter pylori (H. pylori)   . Thyroid dysfunction   . Chest pain   . Back pain   . Hernia     inguinal  . Allergic rhinitis   . Bronchitis     09-14-11  . Chronic airway obstruction, not elsewhere classified   . Coronary artery disease 11/2009    Anterior MI with late presentation 11/2009. Cath showed a 100% proximal LAD occlusion, 99% mid RCA. Had a PCI and 2 DES to both LAD and RCA. EF was 35%. Nuclear stress test 05/13: prior anterior/inferior infarcts without ischemic, EF 43%, cardiac cath in 02/14: Widely patent stents with no other obstructive disease. EF 40%      Past Surgical History  Procedure Laterality Date  . Penile prosthesis implant    . Esophagogastroduodenoscopy  2008  . Colonoscopy    . Esophageal dilation    . Hernia repair  07/20/2012    L inguinal hernia repair  . Admission  12/20/2012    COPD exacerbation.  [place].  . Esophagogastroduodenoscopy  08/20/2012  . Cardiac catheterization    . Coronary angioplasty with stent placement  09/2009    LAD 3.0 X23 mm Xience DES, RCA: 4.0 X 15 mm Xience DES  . Cardiac catheterization  02/05/13    [facility]  . Cardiac catheterization  10/14    [facility] : patent stents with no change in anatomy. EF: 40$     Family History  Problem Relation Age of Onset  . Heart attack Brother     Brother #1  . Diabetes Brother     brother #2  . Hypertension Brother     #3  . Coronary artery disease Father 37    deceased  . COPD Mother 72    deceased  . Heart attack Sister 29    acute-cocaine abuse/deceased  . Heart attack Father   . Diabetes Father      History   Social History  . Marital Status: Married    Spouse Name: N/A    Number of Children: 85  . 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 Smoking status: Current Every Day Smoker -- 2.00 packs/day for 42 years    Types: Cigarettes  . Smokeless tobacco: Never Used  . Alcohol Use: No  . Drug Use: No  . Sexual Activity: Yes  Other Topics Concern  . Not on file   Social History Narrative   Marital status: married x 31 years; happily married.      Children: 4 children;  1 stepchild and 15 grandchildren; no great grandchildren.      Lives: with wife; 2 dogs, 1 cat.      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      Tobacco: 1-2 ppd x 45 years      Alcohol: none      Drugs: none      Exercise: no formal exercise; physically demanding job; owns horses.      Seatbelts:  100%      Guns: one loaded unsecured gun in the home.      Sunscreen: none      PHYSICAL EXAM   BP 138/78  Pulse 84  Ht 5\' 10"  (1.778 m)  Wt 160 lb 8 oz (72.802 kg)  BMI 23.03 kg/m2 Constitutional: He is oriented to person, place, and time. He appears well-developed and well-nourished. No distress.  HENT: No nasal discharge.  Head: Normocephalic and atraumatic.  Eyes: Pupils are equal and round. Right eye exhibits no discharge. Left eye exhibits no discharge.  Neck: Normal range of motion. Neck supple. No JVD present. No thyromegaly present.  Cardiovascular: Normal rate, regular rhythm, normal heart sounds and. Exam reveals no gallop and no friction rub. No murmur heard.  Pulmonary/Chest: Effort normal and diminished breath sounds. No stridor. No respiratory distress. He has no wheezes. He has no rales. He exhibits no tenderness.  Abdominal: Soft. Bowel sounds are normal. He exhibits no distension. There is no tenderness. There is no rebound and no guarding.  Musculoskeletal: Normal range of motion. He exhibits no edema and no tenderness.  Neurological: He is alert and oriented to person, place, and time. Coordination normal.  Skin: Skin is warm and dry. No rash noted. He is  not diaphoretic. No erythema. No pallor.  Psychiatric: He has a normal mood and affect. His behavior is normal. Judgment and thought content normal.  Right radial pulse is normal with no hematoma.      EKG: Sinus  Rhythm  -Old anterior infarct.   -Nonspecific ST depression   +   Nonspecific T-abnormality.   Rightward P/QRS axis and rotation -possible pulmonary disease.   ABNORMAL   ASSESSMENT AND PLAN

## 2014-05-06 NOTE — Patient Instructions (Signed)
Continue same medications.   Your physician wants you to follow-up in: 6 months.  You will receive a reminder letter in the mail two months in advance. If you don't receive a letter, please call our office to schedule the follow-up appointment.  

## 2014-05-06 NOTE — Assessment & Plan Note (Signed)
He is doing very well with no symptoms of angina. Continue medical therapy.  

## 2014-05-10 ENCOUNTER — Ambulatory Visit (INDEPENDENT_AMBULATORY_CARE_PROVIDER_SITE_OTHER): Payer: BC Managed Care – PPO | Admitting: Family Medicine

## 2014-05-10 VITALS — BP 140/86 | HR 74 | Temp 97.7°F | Resp 18 | Ht 70.0 in | Wt 159.0 lb

## 2014-05-10 DIAGNOSIS — E7800 Pure hypercholesterolemia, unspecified: Secondary | ICD-10-CM

## 2014-05-10 DIAGNOSIS — Z0000 Encounter for general adult medical examination without abnormal findings: Secondary | ICD-10-CM

## 2014-05-10 DIAGNOSIS — J209 Acute bronchitis, unspecified: Secondary | ICD-10-CM

## 2014-05-10 DIAGNOSIS — I10 Essential (primary) hypertension: Secondary | ICD-10-CM

## 2014-05-10 DIAGNOSIS — R7309 Other abnormal glucose: Secondary | ICD-10-CM

## 2014-05-10 LAB — LIPID PANEL
CHOL/HDL RATIO: 4.6 ratio
CHOLESTEROL: 169 mg/dL (ref 0–200)
HDL: 37 mg/dL — ABNORMAL LOW (ref >39)
LDL Cholesterol: 113 mg/dL — ABNORMAL HIGH (ref 0–99)
TRIGLYCERIDES: 94 mg/dL (ref <150)
VLDL: 19 mg/dL (ref 0–40)

## 2014-05-10 LAB — POCT URINALYSIS DIPSTICK
Bilirubin, UA: NEGATIVE
Blood, UA: NEGATIVE
GLUCOSE UA: NEGATIVE
Ketones, UA: NEGATIVE
Leukocytes, UA: NEGATIVE
Nitrite, UA: NEGATIVE
PROTEIN UA: NEGATIVE
SPEC GRAV UA: 1.01
Urobilinogen, UA: 0.2
pH, UA: 7

## 2014-05-10 LAB — HEMOGLOBIN A1C
Hgb A1c MFr Bld: 6.2 % — ABNORMAL HIGH (ref <5.7)
MEAN PLASMA GLUCOSE: 131 mg/dL — AB (ref <117)

## 2014-05-10 MED ORDER — LEVOFLOXACIN 750 MG PO TABS: 750.0000 mg | ORAL_TABLET | Freq: Every day | ORAL | Status: DC

## 2014-05-10 NOTE — Progress Notes (Signed)
Subjective:    Patient ID: Leslie Sims, male    DOB: 08/08/1951, 63 y.o.   MRN: [id number]  05/10/2014  Wellness exam   HPI This 63 y.o. male presents for Complete Physical Examination.  Last physical 02/14/2013. Tetanus 2012. Pneumovax 2008. Zostavax never. Previous chicken pox?  Not interested in Zostavax at this time. Influenza vaccine 2014. Colonoscopy 08/2012; multiple flat polyps.  Repeat colonoscopy recommended in one year. Eye exam 2011. [facility].  No g/c. Dental exam [facility] every six months.  S/p follow-up with cardiology last week; no changes to management; follow-up recommended in six months.  Cough: onset in past 1-2 weeks; +chest congestion; mild rhinorrhea and nasal congestion; no sneezing.  Compliance with inhalers.  Continues to attend Pulmonary Rehab.  Review of Systems  Constitutional: Negative for fever, chills, diaphoresis, activity change, appetite change, fatigue and unexpected weight change.  HENT: Negative for congestion, dental problem, drooling, ear discharge, ear pain, facial swelling, hearing loss, postnasal drip, sore throat and trouble swallowing.   Eyes: Negative for photophobia and visual disturbance.  Respiratory: Positive for cough, shortness of breath and wheezing. Negative for stridor.   Cardiovascular: Negative for chest pain, palpitations and leg swelling.  Gastrointestinal: Positive for rectal pain. Negative for nausea, vomiting, abdominal pain, diarrhea, constipation, blood in stool and abdominal distention.  Endocrine: Negative for cold intolerance, heat intolerance, polydipsia, polyphagia and polyuria.  Genitourinary: Negative for dysuria, urgency, frequency, hematuria, flank pain, discharge, penile swelling, scrotal swelling, genital sores, penile pain and testicular pain.  Musculoskeletal: Negative for arthralgias, back pain, gait problem, joint swelling, myalgias, neck pain and neck stiffness.  Skin: Negative  for color change, pallor, rash and wound.  Allergic/Immunologic: Negative for immunocompromised state.  Neurological: Negative for dizziness, tremors, seizures, syncope, facial asymmetry, speech difficulty, weakness, light-headedness, numbness and headaches.  Hematological: Negative for adenopathy. Does not bruise/bleed easily.  Psychiatric/Behavioral: Negative for suicidal ideas, sleep disturbance, self-injury and dysphoric mood. The patient is not nervous/anxious.     Past Medical History  Diagnosis Date  . Hyperlipidemia   . COPD (chronic obstructive pulmonary disease)   . Depression   . GERD (gastroesophageal reflux disease)   . Colon polyps 08/2012    colonoscopy; multiple colon polyps; repeat colonoscopy in one year.  . Status post dilation of esophageal narrowing 2000  . Peptic ulcer   . Hypertension   . CARDIOMYOPATHY 03/24/2010    did not tolerate Toprol and Lisinopril.   . Insomnia   . Hemorrhoids   . MI (myocardial infarction)   . Atherosclerosis   . Anxiety   . Headache(784.0)   . Tinea pedis   . Myalgia   . Helicobacter pylori (H. pylori)   . Thyroid dysfunction   . Chest pain   . Back pain   . Hernia     inguinal  . Allergic rhinitis   . Bronchitis     09-14-11  . Chronic airway obstruction, not elsewhere classified   . Coronary artery disease 11/2009    Anterior MI with late presentation 11/2009. Cath showed a 100% proximal LAD occlusion, 99% mid RCA. Had a PCI and 2 DES to both LAD and RCA. EF was 35%. Nuclear stress test 05/13: prior anterior/inferior infarcts without ischemic, EF 43%, cardiac cath in 02/14: Widely patent stents with no other obstructive disease. EF 40%    Past Surgical History  Procedure Laterality Date  . Penile prosthesis implant    . Esophagogastroduodenoscopy  2008  . Colonoscopy    .  Esophageal dilation    . Hernia repair  07/20/2012    L inguinal hernia repair  . Admission  12/20/2012    COPD exacerbation.  [place].  .  Esophagogastroduodenoscopy  08/20/2012  . Cardiac catheterization    . Coronary angioplasty with stent placement  09/2009    LAD 3.0 X23 mm Xience DES, RCA: 4.0 X 15 mm Xience DES  . Cardiac catheterization  02/05/13    [facility]  . Cardiac catheterization  10/14    [facility] : patent stents with no change in anatomy. EF: 40$    Allergies  Allergen Reactions  . Wellbutrin [Bupropion] Other (See Comments)    Unknown/"made me feel real funny"   Current Outpatient Prescriptions  Medication Sig Dispense Refill  . ADVAIR DISKUS 500-50 MCG/DOSE AEPB USE 2 INHALATIONS INTO THE LUNGS  DAILY (DISCARD 30 DAYS AFTER   OPENING)  3 each  1  . albuterol (PROVENTIL) (2.5 MG/3ML) 0.083% nebulizer solution Take 3 mLs (2.5 mg total) by nebulization every 6 (six) hours as needed for wheezing or shortness of breath.  75 mL  12  . albuterol (VENTOLIN HFA) 108 (90 BASE) MCG/ACT inhaler Inhale 2 puffs into the lungs every 6 (six) hours as needed. For wheezing  54 g  3  . aspirin 81 MG tablet Take 81 mg by mouth daily.        [name] atorvastatin (LIPITOR) 20 MG tablet TAKE 1 TABLET DAILY  90 tablet  3  . chlorpheniramine-HYDROcodone (TUSSIONEX) 10-8 MG/5ML LQCR Take 5 mLs by mouth every 12 (twelve) hours as needed for cough.  180 mL  0  . clopidogrel (PLAVIX) 75 MG tablet Take 1 tablet (75 mg total) by mouth daily.  90 tablet  3  . esomeprazole (NEXIUM) 40 MG capsule Take 40 mg by mouth daily at 12 noon.      . fluticasone (FLONASE) 50 MCG/ACT nasal spray Place 2 sprays into both nostrils daily.  48 g  3  . levalbuterol (XOPENEX) 1.25 MG/3ML nebulizer solution Take 1.25 mg by nebulization 3 (three) times daily.  270 mL  11  . LORazepam (ATIVAN) 0.5 MG tablet Take 1 tablet (0.5 mg total) by mouth as needed.  30 tablet  3  . losartan (COZAAR) 25 MG tablet Take 1 tablet (25 mg total) by mouth daily.  14 tablet  4  . Nebulizers (NEBULIZER COMPRESSOR) MISC Use to inhale albuterol solution every 6 hrs as needed. Dx codes: 496, 491.21,  466.0.  1 each  0  . nicotine (NICOTROL) 10 MG inhaler Inhale 1 cartridge (1 continuous puffing total) into the lungs as needed for smoking cessation. Use as directed  42 each  3  . nitroGLYCERIN (NITROSTAT) 0.4 MG SL tablet Place 1 tablet (0.4 mg total) under the tongue every 5 (five) minutes as needed.  25 tablet  3  . PRESCRIPTION MEDICATION Daliresp  300 mcg taking daily      . sertraline (ZOLOFT) 100 MG tablet Take 100 mg by mouth daily.      [name] terbinafine (LAMISIL) 1 % cream Apply 1 application topically 2 (two) times daily.      [name] tiotropium (SPIRIVA) 18 MCG inhalation capsule Place 1 capsule (18 mcg total) into inhaler and inhale daily.  90 capsule  3  . levofloxacin (LEVAQUIN) 750 MG tablet Take 1 tablet (750 mg total) by mouth daily.  7 tablet  0   Current Facility-Administered Medications  Medication Dose Route Frequency Provider Last Rate Last Dose  .  albuterol (PROVENTIL) (2.5 MG/3ML) 0.083% nebulizer solution 2.5 mg  2.5 mg Nebulization Once [name], MD      . albuterol (PROVENTIL) (2.5 MG/3ML) 0.083% nebulizer solution 2.5 mg  2.5 mg Nebulization Once [name], MD       History   Social History  . Marital Status: Married    Spouse Name: N/A    Number of Children: 5  . 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 Smoking status: Current Every Day Smoker -- 2.00 packs/day for 42 years    Types: Cigarettes  . Smokeless tobacco: Never Used  . Alcohol Use: No  . Drug Use: No  . Sexual Activity: Yes   Other Topics Concern  . Not on file   Social History Narrative   Marital status: married x 32 years; happily married.      Children: 4 children;  1 stepchild and 15 grandchildren; no great grandchildren.      Lives: with wife; 2 dogs, 1 cat.      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      Tobacco: 1 ppd x 45 years      Alcohol: none; previous alcoholism      Drugs: none       Exercise: no formal exercise; physically demanding job; owns horses.      Seatbelts:  100%      Guns: one loaded unsecured gun in the home.      Sunscreen: none   Family History  Problem Relation Age of Onset  . Heart attack Brother     Brother #1  . Diabetes Brother     brother #2  . Hypertension Brother     #3  . Coronary artery disease Father 39    deceased  . Heart attack Father   . Diabetes Father   . Heart disease Father   . COPD Mother 35    deceased  . Heart attack Sister 40    acute-cocaine abuse/deceased  . Alcohol abuse Sister     polysubstance abuse  . COPD Sister   . Alcohol abuse Sister     polysubstance abuse  . Heart disease Brother     AMI x multiple       Objective:    BP 140/86  Pulse 74  Temp(Src) 97.7 F (36.5 C) (Oral)  Resp 18  Ht 5\' 10"  (1.778 m)  Wt 159 lb (72.122 kg)  BMI 22.81 kg/m2  SpO2 96% Physical Exam  Constitutional: He is oriented to person, place, and time. He appears well-developed and well-nourished. No distress.  HENT:  Head: Normocephalic and atraumatic.  Right Ear: External ear normal.  Left Ear: External ear normal.  Nose: Nose normal.  Mouth/Throat: Oropharynx is clear and moist.  Eyes: Conjunctivae and EOM are normal. Pupils are equal, round, and reactive to light.  Neck: Normal range of motion. Neck supple. Carotid bruit is not present. No thyromegaly present.  Cardiovascular: Normal rate, regular rhythm, normal heart sounds and intact distal pulses.  Exam reveals no gallop and no friction rub.   No murmur heard. Pulmonary/Chest: Effort normal and breath sounds normal. He has no wheezes. He has no rales.  Distant breath sounds throughout.  No tachypnea or respiratory distress.  Abdominal: Soft. Bowel sounds are normal. He exhibits no distension and no mass. There is no tenderness. There is no rebound and no guarding.  Hernia confirmed negative in the right inguinal area and confirmed negative in the left  inguinal area.  Genitourinary: Prostate normal, testes normal and penis normal. Rectal exam shows external hemorrhoid. Prostate is not enlarged and not tender. Circumcised. No penile tenderness.  Penile prosthesis present.  Musculoskeletal:       Right shoulder: Normal.       Left shoulder: Normal.       Cervical back: Normal.  Lymphadenopathy:    He has no cervical adenopathy.       Right: No inguinal adenopathy present.       Left: No inguinal adenopathy present.  Neurological: He is alert and oriented to person, place, and time. He has normal reflexes. No cranial nerve deficit. He exhibits normal muscle tone. Coordination normal.  Skin: Skin is warm and dry. No rash noted. He is not diaphoretic.  Psychiatric: He has a normal mood and affect. His behavior is normal. Judgment and thought content normal.   Results for orders placed in visit on 05/10/14  POCT URINALYSIS DIPSTICK      Result Value Ref Range   Color, UA yellow     Clarity, UA clear     Glucose, UA neg     Bilirubin, UA neg     Ketones, UA neg     Spec Grav, UA 1.010     Blood, UA neg     pH, UA 7.0     Protein, UA neg     Urobilinogen, UA 0.2     Nitrite, UA neg     Leukocytes, UA Negative         Assessment & Plan:  Routine general medical examination at a health care facility - Plan: POCT urinalysis dipstick, Lipid panel, Hemoglobin A1c, CANCELED: POCT glycosylated hemoglobin (Hb A1C)  Pure hypercholesterolemia - Plan: Lipid panel  Other abnormal glucose - Plan: Hemoglobin A1c, CANCELED: POCT glycosylated hemoglobin (Hb A1C)  Essential hypertension, benign  Acute bronchitis  1.  Complete physical examination:  Anticipatory guidance --- continued exercise with pulmonary rehab, smoking cessation.  Colonoscopy due.  Immunizations reviewed--pt declined Zostavax rx.  Prostate exam completed; did not obtain PSA ; plans to go urology PSA clinic.  Insurance form completed. 2.  Hyperlipidemia: controlled; obtain  labs.   3.  Glucose intolerance:  Controlled; obtain labs.  Continue with dietary modification. 4.  HTN: moderately controlled. 5. Acute bronchitis: New.  Rx for Levaquin provided. 6.  COPD: stable; followed by [facility]; participating in pulmonary rehab; continue current regimen.  Meds ordered this encounter  Medications  . terbinafine (LAMISIL) 1 % cream    Sig: Apply 1 application topically 2 (two) times daily.  [name] levofloxacin (LEVAQUIN) 750 MG tablet    Sig: Take 1 tablet (750 mg total) by mouth daily.    Dispense:  7 tablet    Refill:  0    No Follow-up on file.   [name], M.D.  [facility] [address] [place], [place]  [address] [phone] phone [phone] fax

## 2014-05-20 ENCOUNTER — Encounter: Payer: Self-pay | Admitting: Pulmonary Disease

## 2014-06-17 ENCOUNTER — Encounter: Payer: Self-pay | Admitting: Family Medicine

## 2014-06-17 ENCOUNTER — Ambulatory Visit (INDEPENDENT_AMBULATORY_CARE_PROVIDER_SITE_OTHER): Payer: BC Managed Care – PPO | Admitting: Family Medicine

## 2014-06-17 VITALS — BP 120/58 | HR 76 | Temp 98.3°F | Resp 16 | Ht 65.5 in | Wt 153.4 lb

## 2014-06-17 DIAGNOSIS — R7302 Impaired glucose tolerance (oral): Secondary | ICD-10-CM

## 2014-06-17 DIAGNOSIS — I2510 Atherosclerotic heart disease of native coronary artery without angina pectoris: Secondary | ICD-10-CM

## 2014-06-17 DIAGNOSIS — J441 Chronic obstructive pulmonary disease with (acute) exacerbation: Secondary | ICD-10-CM

## 2014-06-17 DIAGNOSIS — S29011A Strain of muscle and tendon of front wall of thorax, initial encounter: Secondary | ICD-10-CM

## 2014-06-17 DIAGNOSIS — F341 Dysthymic disorder: Secondary | ICD-10-CM

## 2014-06-17 DIAGNOSIS — I10 Essential (primary) hypertension: Secondary | ICD-10-CM

## 2014-06-17 DIAGNOSIS — F17200 Nicotine dependence, unspecified, uncomplicated: Secondary | ICD-10-CM

## 2014-06-17 DIAGNOSIS — E785 Hyperlipidemia, unspecified: Secondary | ICD-10-CM

## 2014-06-17 DIAGNOSIS — K219 Gastro-esophageal reflux disease without esophagitis: Secondary | ICD-10-CM

## 2014-06-17 DIAGNOSIS — R7309 Other abnormal glucose: Secondary | ICD-10-CM

## 2014-06-17 DIAGNOSIS — IMO0002 Reserved for concepts with insufficient information to code with codable children: Secondary | ICD-10-CM

## 2014-06-17 LAB — CBC WITH DIFFERENTIAL/PLATELET
BASOS ABS: 0 10*3/uL (ref 0.0–0.1)
Basophils Relative: 0 % (ref 0–1)
Eosinophils Absolute: 0.1 10*3/uL (ref 0.0–0.7)
Eosinophils Relative: 2 % (ref 0–5)
HEMATOCRIT: 43.8 % (ref 39.0–52.0)
Hemoglobin: 15.2 g/dL (ref 13.0–17.0)
LYMPHS PCT: 23 % (ref 12–46)
Lymphs Abs: 1.5 10*3/uL (ref 0.7–4.0)
MCH: 28.4 pg (ref 26.0–34.0)
MCHC: 34.7 g/dL (ref 30.0–36.0)
MCV: 81.9 fL (ref 78.0–100.0)
Monocytes Absolute: 0.7 10*3/uL (ref 0.1–1.0)
Monocytes Relative: 10 % (ref 3–12)
NEUTROS ABS: 4.4 10*3/uL (ref 1.7–7.7)
Neutrophils Relative %: 65 % (ref 43–77)
Platelets: 238 10*3/uL (ref 150–400)
RBC: 5.35 MIL/uL (ref 4.22–5.81)
RDW: 13.9 % (ref 11.5–15.5)
WBC: 6.7 10*3/uL (ref 4.0–10.5)

## 2014-06-17 LAB — COMPREHENSIVE METABOLIC PANEL
ALT: 13 U/L (ref 0–53)
AST: 13 U/L (ref 0–37)
Albumin: 4.3 g/dL (ref 3.5–5.2)
Alkaline Phosphatase: 73 U/L (ref 39–117)
BUN: 9 mg/dL (ref 6–23)
CALCIUM: 9.3 mg/dL (ref 8.4–10.5)
CHLORIDE: 103 meq/L (ref 96–112)
CO2: 30 meq/L (ref 19–32)
CREATININE: 0.89 mg/dL (ref 0.50–1.35)
GLUCOSE: 76 mg/dL (ref 70–99)
Potassium: 4.1 mEq/L (ref 3.5–5.3)
Sodium: 140 mEq/L (ref 135–145)
Total Bilirubin: 0.3 mg/dL (ref 0.2–1.2)
Total Protein: 6.1 g/dL (ref 6.0–8.3)

## 2014-06-17 MED ORDER — PREDNISONE 20 MG PO TABS: ORAL_TABLET | ORAL | Status: DC

## 2014-06-17 NOTE — Progress Notes (Signed)
Subjective:  This chart was scribed for Leslie Honour, MD by Ladene Artist, ED Scribe. The patient was seen in room 21. Patient's care was started at 11:26 AM.   Patient ID: Leslie Sims, male    DOB: November 08, 1951, 63 y.o.   MRN: [id number]  06/17/2014  Hypertension, hypercholesterolemia and COPD  HPI HPI Comments: Leslie Sims is a 63 y.o. male who presents to the [facility] for 3 month follow-up for COPD, glucose intolerance, and hyperlipidemia. Last cholesterol check on 05/10/14, LDL was 113, checked glucose intolerance, hgb A1c was 6.2 which was down from 6.5 in January. Saw cardiology on 05/06/14, h/o CAD. No changes to management were made at that cardiology visit. Pt has a h/o anxiety and sees psychiatry; has been doing well.   Pt reports intermittent SOB onset 2 weeks ago. He reports associated mild cough, mild wheezing. Pt states that he used Clorox to pressure wash his boat and mobile home. He believes that his symptoms are related to fumes although he was wearing a mask. Pt denies fever, chills, congestion, rhinorrhea. Uses nebulizer x2 daily, inhaler use varies. Pt has not checked O2 levels at home. Pt also wants advice on whether he should take daliresp or not.  He has not been to pulmonary rehab for the past several weeks due to B knee pain.  He plans to return to rehab this week.  He also reports intermittent mild chest discomfort that radiates into L shoulder. Pt rates his pain 2-3/10. Pain is not exacerbated with lifting or moving. Nothing improves pain; nothing makes pain worse. No exertional component; no diaphoresis or nausea or SOB related with pain.  Pt also states that he had cortisone injections in both knees with approximately 50% relief. Next appointment in 6 months. Pt reports most pain at night. He was also prescribed medication for pain (Tramadol ER) that he has not taken yet. Pt has tried Tylenol before bedtime with mild relief. Pt also  reports foot cramps last night. He reports sweating more than usual but denies being dehydrated or unusual activities.   Pt attributes elevated cholesterol levels at visit in May to running out of his medication. Pt states that he wakes up at 7:30 AM and eats 4 pieces of buttered toast for breakfast with sweet tea following coffee at 11 AM. Pt states that he eats Leslie Sims for lunch and meat with vegetables for dinner.   Pt states that his stomach is doing well. GERD is well controlled; denies n/v/d/c; denies melanotic or bloody stools.    Pt states that he takes his pills for anxiety "once in a blue moon". Pt reports that he is doing well emotionally. 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 Pt states that this is his stress reliever. He smokes 1 ppd again, but denies a reason why his smoking has recently increased; denies recent stressors; knows that he needs to quit.   Review of Systems  Constitutional: Negative for fever, chills, diaphoresis, activity change and fatigue.  HENT: Negative for congestion, ear pain, rhinorrhea and sore throat.   Respiratory: Positive for cough (mild), shortness of breath (intermittent) and wheezing (mild). Negative for stridor.   Cardiovascular: Positive for chest pain (discomfort). Negative for palpitations and leg swelling.  Gastrointestinal: Negative for nausea, vomiting, abdominal pain, diarrhea, constipation and abdominal distention.  Endocrine: Negative for polydipsia, polyphagia and polyuria.  Musculoskeletal: Positive for arthralgias.  Neurological: Negative for dizziness, tremors, seizures, syncope, facial asymmetry,  speech difficulty, weakness, light-headedness, numbness and headaches.  Psychiatric/Behavioral: Negative for sleep disturbance and dysphoric mood. The patient is not nervous/anxious.    Past Medical History  Diagnosis Date  . Hyperlipidemia   . COPD (chronic obstructive pulmonary disease)   . Depression   . GERD (gastroesophageal  reflux disease)   . Colon polyps 08/2012    colonoscopy; multiple colon polyps; repeat colonoscopy in one year.  . Status post dilation of esophageal narrowing 2000  . Peptic ulcer   . Hypertension   . CARDIOMYOPATHY 03/24/2010    did not tolerate Toprol and Lisinopril.   . Insomnia   . Hemorrhoids   . MI (myocardial infarction)   . Atherosclerosis   . Anxiety   . Headache(784.0)   . Tinea pedis   . Myalgia   . Helicobacter pylori (H. pylori)   . Thyroid dysfunction   . Chest pain   . Back pain   . Hernia     inguinal  . Allergic rhinitis   . Bronchitis     09-14-11  . Chronic airway obstruction, not elsewhere classified   . Coronary artery disease 11/2009    Anterior MI with late presentation 11/2009. Cath showed a 100% proximal LAD occlusion, 99% mid RCA. Had a PCI and 2 DES to both LAD and RCA. EF was 35%. Nuclear stress test 05/13: prior anterior/inferior infarcts without ischemic, EF 43%, cardiac cath in 02/14: Widely patent stents with no other obstructive disease. EF 40%    Past Surgical History  Procedure Laterality Date  . Penile prosthesis implant    . Esophagogastroduodenoscopy  2008  . Colonoscopy    . Esophageal dilation    . Hernia repair  07/20/2012    L inguinal hernia repair  . Admission  12/20/2012    COPD exacerbation.  [place].  . Esophagogastroduodenoscopy  08/20/2012  . Cardiac catheterization    . Coronary angioplasty with stent placement  09/2009    LAD 3.0 X23 mm Xience DES, RCA: 4.0 X 15 mm Xience DES  . Cardiac catheterization  02/05/13    [facility]  . Cardiac catheterization  10/14    [facility] : patent stents with no change in anatomy. EF: 40$    Allergies  Allergen Reactions  . Wellbutrin [Bupropion] Other (See Comments)    Unknown/"made me feel real funny"   Current Outpatient Prescriptions  Medication Sig Dispense Refill  . ADVAIR DISKUS 500-50 MCG/DOSE AEPB USE 2 INHALATIONS INTO THE LUNGS  DAILY (DISCARD 30 DAYS AFTER   OPENING)  3 each  1  .  albuterol (PROVENTIL) (2.5 MG/3ML) 0.083% nebulizer solution Take 3 mLs (2.5 mg total) by nebulization every 6 (six) hours as needed for wheezing or shortness of breath.  75 mL  12  . albuterol (VENTOLIN HFA) 108 (90 BASE) MCG/ACT inhaler Inhale 2 puffs into the lungs every 6 (six) hours as needed. For wheezing  54 g  3  . aspirin 81 MG tablet Take 81 mg by mouth daily.        [name] atorvastatin (LIPITOR) 20 MG tablet TAKE 1 TABLET DAILY  90 tablet  3  . clopidogrel (PLAVIX) 75 MG tablet Take 1 tablet (75 mg total) by mouth daily.  90 tablet  3  . esomeprazole (NEXIUM) 40 MG capsule Take 40 mg by mouth daily at 12 noon.      . fluticasone (FLONASE) 50 MCG/ACT nasal spray Place 2 sprays into both nostrils daily.  48 g  3  . levalbuterol (XOPENEX)  1.25 MG/3ML nebulizer solution Take 1.25 mg by nebulization 3 (three) times daily.  270 mL  11  . LORazepam (ATIVAN) 0.5 MG tablet Take 1 tablet (0.5 mg total) by mouth as needed.  30 tablet  3  . losartan (COZAAR) 25 MG tablet Take 1 tablet (25 mg total) by mouth daily.  14 tablet  4  . Nebulizers (NEBULIZER COMPRESSOR) MISC Use to inhale albuterol solution every 6 hrs as needed. Dx codes: 496, 491.21, 466.0.  1 each  0  . nicotine (NICOTROL) 10 MG inhaler Inhale 1 cartridge (1 continuous puffing total) into the lungs as needed for smoking cessation. Use as directed  42 each  3  . nitroGLYCERIN (NITROSTAT) 0.4 MG SL tablet Place 1 tablet (0.4 mg total) under the tongue every 5 (five) minutes as needed.  25 tablet  3  . PRESCRIPTION MEDICATION Daliresp  300 mcg taking daily      . sertraline (ZOLOFT) 100 MG tablet Take 100 mg by mouth daily.      [name] terbinafine (LAMISIL) 1 % cream Apply 1 application topically 2 (two) times daily.      [name] tiotropium (SPIRIVA) 18 MCG inhalation capsule Place 1 capsule (18 mcg total) into inhaler and inhale daily.  90 capsule  3  . predniSONE (DELTASONE) 20 MG tablet Two tablets daily x 5 days then one tablet daily x 5 days  15  tablet  0   Current Facility-Administered Medications  Medication Dose Route Frequency Provider Last Rate Last Dose  . albuterol (PROVENTIL) (2.5 MG/3ML) 0.083% nebulizer solution 2.5 mg  2.5 mg Nebulization Once Leslie Honour, MD      . albuterol (PROVENTIL) (2.5 MG/3ML) 0.083% nebulizer solution 2.5 mg  2.5 mg Nebulization Once Leslie Honour, MD       History   Social History  . Marital Status: Married    Spouse Name: N/A    Number of Children: 28  . 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 Smoking status: Current Every Day Smoker -- 2.00 packs/day for 42 years    Types: Cigarettes  . Smokeless tobacco: Never Used  . Alcohol Use: No  . Drug Use: No  . Sexual Activity: Yes   Other Topics Concern  . Not on file   Social History Narrative   Marital status: married x 32 years; happily married.      Children: 4 children;  1 stepchild and 15 grandchildren; no great grandchildren.      Lives: with wife; 2 dogs, 1 cat.      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      Tobacco: 1 ppd x 45 years      Alcohol: none; previous alcoholism      Drugs: none      Exercise: no formal exercise; physically demanding job; owns horses.      Seatbelts:  100%      Guns: one loaded unsecured gun in the home.      Sunscreen: none       Objective:    Triage Vitals: BP 120/58  Pulse 76  Temp(Src) 98.3 F (36.8 C) (Oral)  Resp 16  Ht 5' 5.5" (1.664 m)  Wt 153 lb 6.4 oz (69.582 kg)  BMI 25.13 kg/m2  SpO2 95% Physical Exam  Nursing note and vitals reviewed. Constitutional: He is oriented to person, place, and time. He appears well-developed  and well-nourished. No distress.  HENT:  Head: Normocephalic and atraumatic.  Right Ear: Tympanic membrane and external ear normal.  Left Ear: Tympanic membrane and external ear normal.  Nose: Nose normal.  Mouth/Throat: Oropharynx is clear and moist and  mucous membranes are normal.  Eyes: Conjunctivae and EOM are normal. Pupils are equal, round, and reactive to light.  Neck: Normal range of motion. Neck supple. Carotid bruit is not present. No mass and no thyromegaly present.  Cardiovascular: Normal rate, regular rhythm, normal heart sounds and intact distal pulses.  Exam reveals no gallop and no friction rub.   No murmur heard. Pulmonary/Chest: Effort normal and breath sounds normal. No respiratory distress. He has no wheezes. He has no rales. He exhibits tenderness. He exhibits no mass, no bony tenderness, no crepitus, no edema and no swelling. Left breast exhibits tenderness.    Distant breath sounds throughout.  Abdominal: Soft. Bowel sounds are normal. He exhibits no distension and no mass. There is no tenderness. There is no rebound and no guarding.  Musculoskeletal: Normal range of motion.       Right shoulder: Normal.       Left shoulder: Normal.       Cervical back: Normal.  +TTP L anterior chest wall.  Lymphadenopathy:    He has no cervical adenopathy.  Neurological: He is alert and oriented to person, place, and time. No cranial nerve deficit.  Skin: Skin is warm and dry. No rash noted. He is not diaphoretic.  Psychiatric: He has a normal mood and affect. His behavior is normal.   Results for orders placed in visit on 06/17/14  COMPREHENSIVE METABOLIC PANEL      Result Value Ref Range   Sodium 140  135 - 145 mEq/L   Potassium 4.1  3.5 - 5.3 mEq/L   Chloride 103  96 - 112 mEq/L   CO2 30  19 - 32 mEq/L   Glucose, Bld 76  70 - 99 mg/dL   BUN 9  6 - 23 mg/dL   Creat 0.89  0.50 - 1.35 mg/dL   Total Bilirubin 0.3  0.2 - 1.2 mg/dL   Alkaline Phosphatase 73  39 - 117 U/L   AST 13  0 - 37 U/L   ALT 13  0 - 53 U/L   Total Protein 6.1  6.0 - 8.3 g/dL   Albumin 4.3  3.5 - 5.2 g/dL   Calcium 9.3  8.4 - 10.5 mg/dL  CBC WITH DIFFERENTIAL      Result Value Ref Range   WBC 6.7  4.0 - 10.5 K/uL   RBC 5.35  4.22 - 5.81 MIL/uL    Hemoglobin 15.2  13.0 - 17.0 g/dL   HCT 43.8  39.0 - 52.0 %   MCV 81.9  78.0 - 100.0 fL   MCH 28.4  26.0 - 34.0 pg   MCHC 34.7  30.0 - 36.0 g/dL   RDW 13.9  11.5 - 15.5 %   Platelets 238  150 - 400 K/uL   Neutrophils Relative % 65  43 - 77 %   Neutro Abs 4.4  1.7 - 7.7 K/uL   Lymphocytes Relative 23  12 - 46 %   Lymphs Abs 1.5  0.7 - 4.0 K/uL   Monocytes Relative 10  3 - 12 %   Monocytes Absolute 0.7  0.1 - 1.0 K/uL   Eosinophils Relative 2  0 - 5 %   Eosinophils Absolute 0.1  0.0 - 0.7 K/uL  Basophils Relative 0  0 - 1 %   Basophils Absolute 0.0  0.0 - 0.1 K/uL   Smear Review Criteria for review not met        Assessment & Plan:   1. DEPRESSION/ANXIETY   2. HYPERLIPIDEMIA   3. SMOKER   4. Atherosclerosis of native coronary artery of native heart without angina pectoris   5. COPD exacerbation   6. Essential hypertension, benign   7. Gastroesophageal reflux disease, esophagitis presence not specified   8. Chest wall muscle strain, initial encounter   9. Glucose intolerance (impaired glucose tolerance)    1. COPD: worsening due to chemical exposure; rx for Prednisone provided.  Continue current medications.  Continue pulmonary rehab.  Continue management by Dr. Raul Del of pulmonology in [place]. 2.  Hyperlipidemia: uncontrolled last month due to running out of medication; has now restarted.  Recheck in six months.  Obtain CMET. 3.  HTN: controlled.  Obtain CMET.   4.  CAD: asymptomatic; s/p recent follow-up with cardiology; no changes to management. 5.  GERD: controlled; no change to management. 6.  Chest wall strain L; New.  Reassurance provided.  Recommend resting, avoiding lifting > 10 pounds for two weeks. 7.  Anxiety and depression: controlled; managed by psychiatry.   8. OA B knees: new and improved; s/p steroid injections by [facility]. 9. Glucose intolerance: stable; continue with dietary modification; counseling provided on diet in office.  Meds ordered this  encounter  Medications  . predniSONE (DELTASONE) 20 MG tablet    Sig: Two tablets daily x 5 days then one tablet daily x 5 days    Dispense:  15 tablet    Refill:  0    Return in about 3 months (around 09/17/2014) for recheck high choleserol, COPD, HTN, glucose intolerance.   I personally performed the services described in this documentation, which was scribed in my presence. The recorded information has been reviewed and is accurate.  [name], M.D.  [facility] [address] [place], [place]  [address] [phone] phone [phone] fax

## 2014-06-18 DIAGNOSIS — R7302 Impaired glucose tolerance (oral): Secondary | ICD-10-CM | POA: Insufficient documentation

## 2014-06-19 ENCOUNTER — Encounter: Payer: Self-pay | Admitting: Pulmonary Disease

## 2014-06-28 ENCOUNTER — Other Ambulatory Visit: Payer: Self-pay

## 2014-06-28 DIAGNOSIS — I5022 Chronic systolic (congestive) heart failure: Secondary | ICD-10-CM

## 2014-06-28 DIAGNOSIS — R0789 Other chest pain: Secondary | ICD-10-CM

## 2014-06-28 MED ORDER — LOSARTAN POTASSIUM 25 MG PO TABS: 25.0000 mg | ORAL_TABLET | Freq: Every day | ORAL | Status: DC

## 2014-06-28 MED ORDER — CLOPIDOGREL BISULFATE 75 MG PO TABS: 75.0000 mg | ORAL_TABLET | Freq: Every day | ORAL | Status: DC

## 2014-06-28 NOTE — Telephone Encounter (Signed)
Pt needs only 7 day supply

## 2014-07-20 ENCOUNTER — Encounter: Payer: Self-pay | Admitting: Pulmonary Disease

## 2014-08-15 ENCOUNTER — Other Ambulatory Visit: Payer: Self-pay | Admitting: Family Medicine

## 2014-08-15 ENCOUNTER — Telehealth: Payer: Self-pay | Admitting: Family Medicine

## 2014-08-15 MED ORDER — HYDROCORTISONE ACETATE 10 % RE FOAM: 1.0000 | Freq: Two times a day (BID) | RECTAL | Status: DC

## 2014-08-15 MED ORDER — PREDNISONE 20 MG PO TABS: ORAL_TABLET | ORAL | Status: DC

## 2014-08-15 MED ORDER — ALBUTEROL SULFATE HFA 108 (90 BASE) MCG/ACT IN AERS: 2.0000 | INHALATION_SPRAY | Freq: Four times a day (QID) | RESPIRATORY_TRACT | Status: DC | PRN

## 2014-08-15 NOTE — Telephone Encounter (Signed)
Pt needs refill of Albuterol HFA and Prednisone due to pressure washing last week. Has been wheezing a little.

## 2014-08-20 ENCOUNTER — Encounter: Payer: Self-pay | Admitting: Pulmonary Disease

## 2014-09-16 ENCOUNTER — Ambulatory Visit: Payer: BC Managed Care – PPO | Admitting: Family Medicine

## 2014-09-19 ENCOUNTER — Encounter: Payer: Self-pay | Admitting: Pulmonary Disease

## 2014-09-30 ENCOUNTER — Ambulatory Visit (INDEPENDENT_AMBULATORY_CARE_PROVIDER_SITE_OTHER): Payer: BC Managed Care – PPO

## 2014-09-30 ENCOUNTER — Ambulatory Visit (INDEPENDENT_AMBULATORY_CARE_PROVIDER_SITE_OTHER): Payer: BC Managed Care – PPO | Admitting: Family Medicine

## 2014-09-30 ENCOUNTER — Encounter: Payer: Self-pay | Admitting: Family Medicine

## 2014-09-30 VITALS — BP 124/69 | HR 85 | Temp 98.3°F | Resp 18 | Ht 69.5 in | Wt 153.6 lb

## 2014-09-30 DIAGNOSIS — J441 Chronic obstructive pulmonary disease with (acute) exacerbation: Secondary | ICD-10-CM

## 2014-09-30 DIAGNOSIS — I10 Essential (primary) hypertension: Secondary | ICD-10-CM

## 2014-09-30 DIAGNOSIS — R05 Cough: Secondary | ICD-10-CM

## 2014-09-30 DIAGNOSIS — R7302 Impaired glucose tolerance (oral): Secondary | ICD-10-CM

## 2014-09-30 DIAGNOSIS — E785 Hyperlipidemia, unspecified: Secondary | ICD-10-CM

## 2014-09-30 DIAGNOSIS — R059 Cough, unspecified: Secondary | ICD-10-CM

## 2014-09-30 LAB — CBC WITH DIFFERENTIAL/PLATELET
Basophils Absolute: 0 10*3/uL (ref 0.0–0.1)
Basophils Relative: 0 % (ref 0–1)
EOS ABS: 0.1 10*3/uL (ref 0.0–0.7)
Eosinophils Relative: 2 % (ref 0–5)
HCT: 41.6 % (ref 39.0–52.0)
Hemoglobin: 14.4 g/dL (ref 13.0–17.0)
LYMPHS ABS: 1.4 10*3/uL (ref 0.7–4.0)
Lymphocytes Relative: 20 % (ref 12–46)
MCH: 28.6 pg (ref 26.0–34.0)
MCHC: 34.6 g/dL (ref 30.0–36.0)
MCV: 82.5 fL (ref 78.0–100.0)
MONO ABS: 1 10*3/uL (ref 0.1–1.0)
MONOS PCT: 14 % — AB (ref 3–12)
Neutro Abs: 4.4 10*3/uL (ref 1.7–7.7)
Neutrophils Relative %: 64 % (ref 43–77)
Platelets: 206 10*3/uL (ref 150–400)
RBC: 5.04 MIL/uL (ref 4.22–5.81)
RDW: 14.4 % (ref 11.5–15.5)
WBC: 6.9 10*3/uL (ref 4.0–10.5)

## 2014-09-30 LAB — COMPREHENSIVE METABOLIC PANEL
ALT: 16 U/L (ref 0–53)
AST: 16 U/L (ref 0–37)
Albumin: 4.4 g/dL (ref 3.5–5.2)
Alkaline Phosphatase: 88 U/L (ref 39–117)
BILIRUBIN TOTAL: 0.5 mg/dL (ref 0.2–1.2)
BUN: 9 mg/dL (ref 6–23)
CO2: 26 mEq/L (ref 19–32)
CREATININE: 0.78 mg/dL (ref 0.50–1.35)
Calcium: 9.5 mg/dL (ref 8.4–10.5)
Chloride: 103 mEq/L (ref 96–112)
Glucose, Bld: 81 mg/dL (ref 70–99)
Potassium: 4.3 mEq/L (ref 3.5–5.3)
Sodium: 140 mEq/L (ref 135–145)
Total Protein: 6.6 g/dL (ref 6.0–8.3)

## 2014-09-30 LAB — HEMOGLOBIN A1C
Hgb A1c MFr Bld: 6.4 % — ABNORMAL HIGH (ref <5.7)
MEAN PLASMA GLUCOSE: 137 mg/dL — AB (ref <117)

## 2014-09-30 IMAGING — CR DG CHEST 2V
3 series · 3 of 3 positions shown · non-contrast
Comparison: Chest x-ray of [DATE]

CLINICAL DATA: Cough, COPD exacerbation

EXAM:
CHEST  2 VIEW

[PA (1 of 2)]
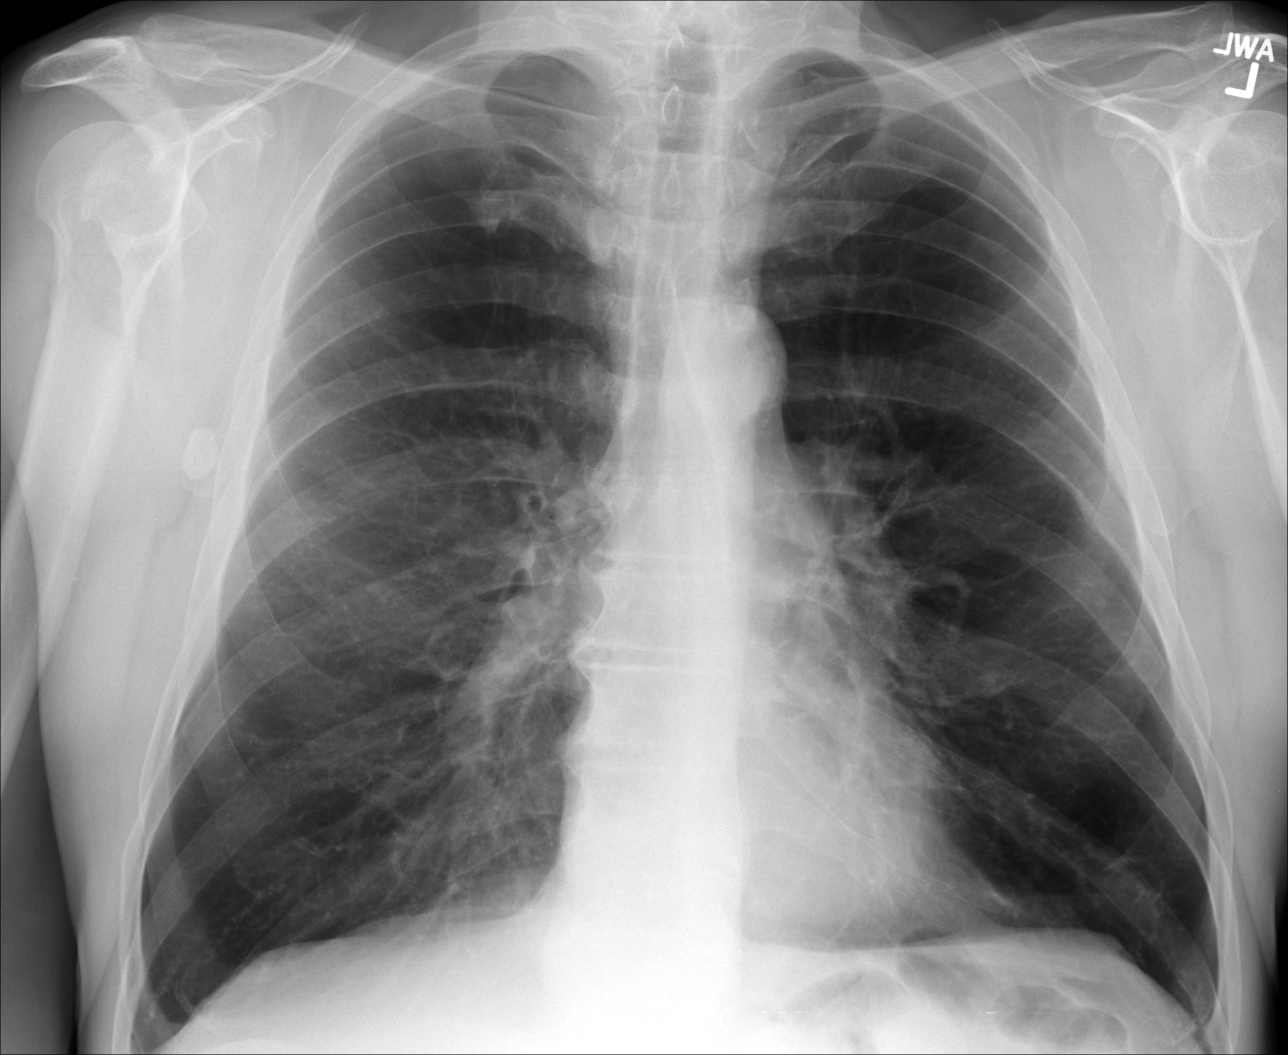

[lateral]
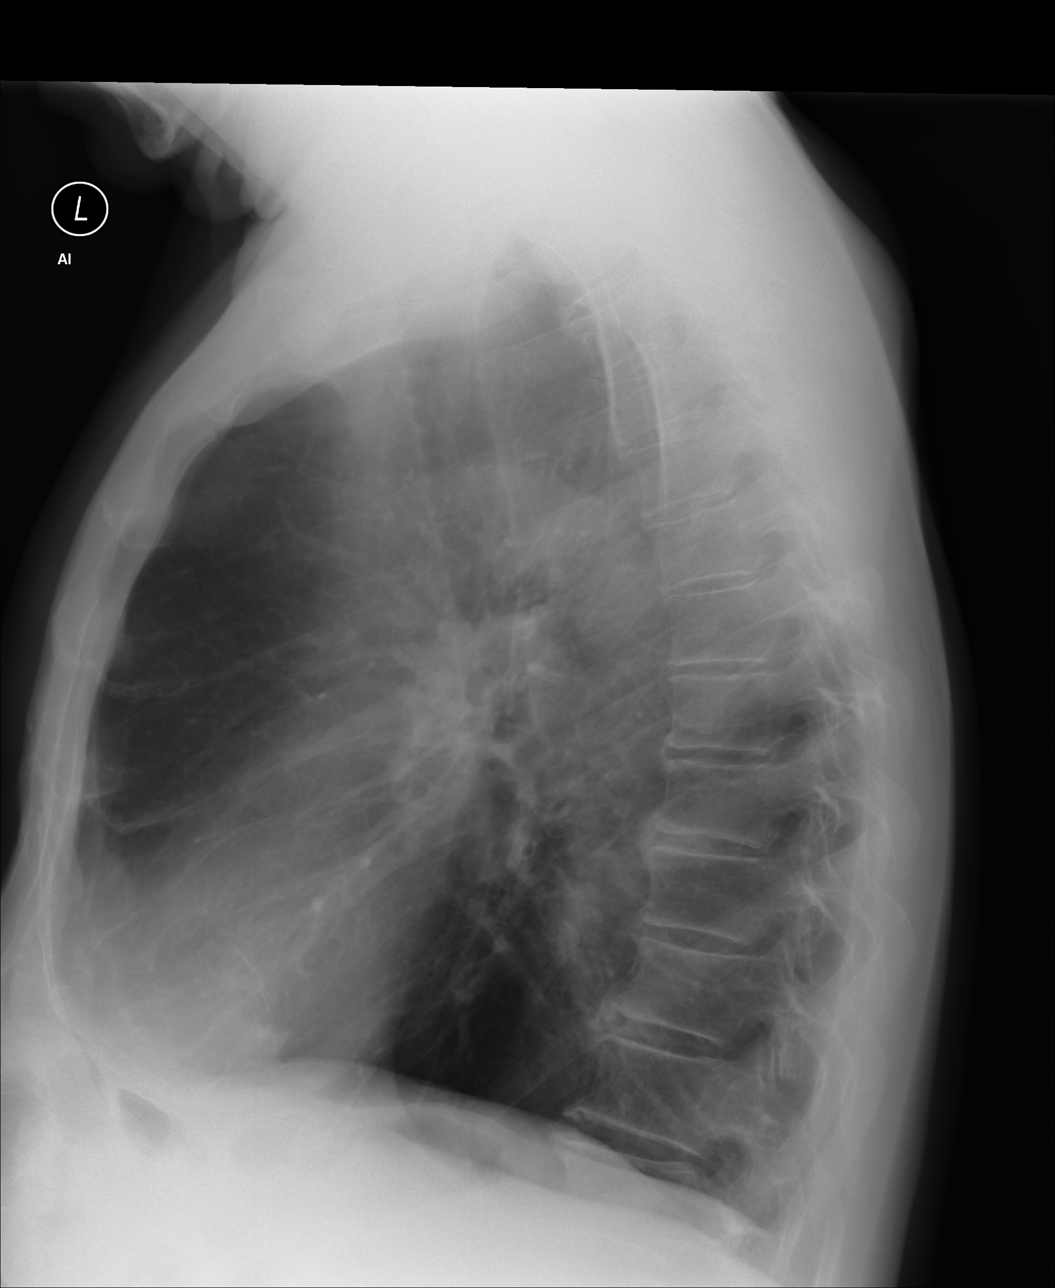

[PA (2 of 2)]
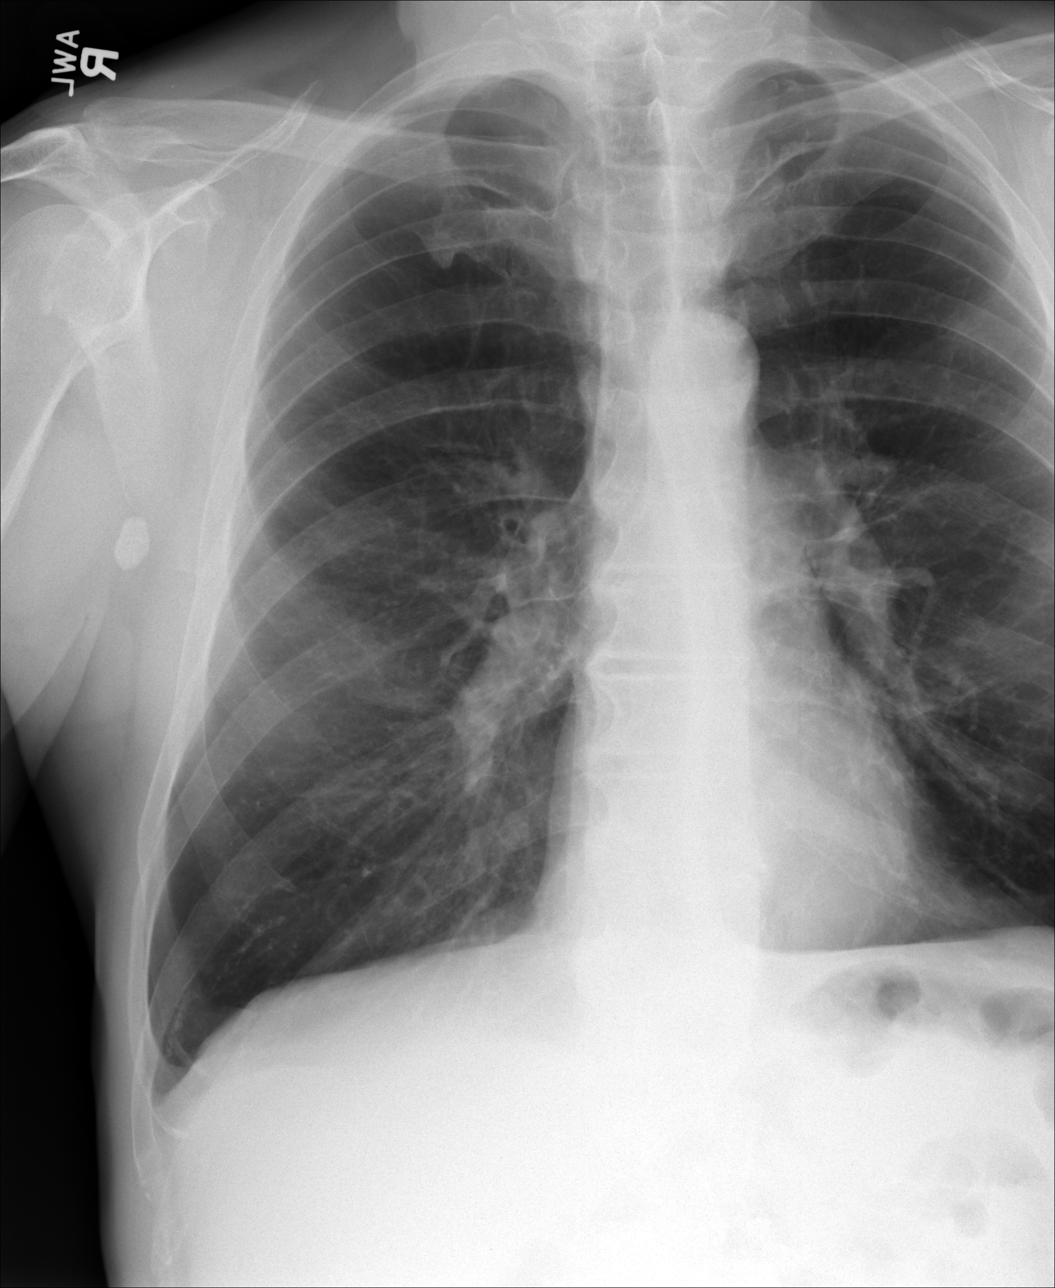

[3 of 3 positions shown; findings below may reference images not displayed]

FINDINGS: The lungs are hyperaerated with flattened hemidiaphragms and
increased AP diameter consistent with emphysema. No focal infiltrate
or effusion is seen. Mild peribronchial thickening is again noted.
The heart is within normal limits in size. There are mild
degenerative changes in the lower thoracic spine. A calcified right
axillary lymph node is again noted.
IMPRESSION: Emphysema.  No active lung disease.  Mild peribronchial thickening.

## 2014-09-30 MED ORDER — ALBUTEROL SULFATE (2.5 MG/3ML) 0.083% IN NEBU
2.5000 mg | INHALATION_SOLUTION | Freq: Once | RESPIRATORY_TRACT | Status: AC
  Administered 2014-09-30: 2.5 mg via RESPIRATORY_TRACT

## 2014-09-30 MED ORDER — METHYLPREDNISOLONE ACETATE 80 MG/ML IJ SUSP
80.0000 mg | Freq: Once | INTRAMUSCULAR | Status: AC
Start: 2014-09-30 — End: 2014-09-30
  Administered 2014-09-30: 80 mg via INTRAMUSCULAR

## 2014-09-30 MED ORDER — HYDROCOD POLST-CHLORPHEN POLST 10-8 MG/5ML PO LQCR: 5.0000 mL | Freq: Two times a day (BID) | ORAL | Status: DC | PRN

## 2014-09-30 MED ORDER — DOXYCYCLINE HYCLATE 100 MG PO CAPS: 100.0000 mg | ORAL_CAPSULE | Freq: Two times a day (BID) | ORAL | Status: DC

## 2014-09-30 MED ORDER — PREDNISONE 20 MG PO TABS: ORAL_TABLET | ORAL | Status: DC

## 2014-09-30 NOTE — Progress Notes (Signed)
Subjective:    Patient ID: Leslie Sims, male    DOB: 07/30/1951, 63 y.o.   MRN: [id number]  Shortness of Breath   Mr. Speigner is presenting for follow up on multiple chronic issues.  COPD - reports medication compliance. However, Mr. Ruderman has not quit smoking, is back up to 1.5 ppd. Reports that he's been going under some stress lately and picked it back up despite knowing he should quit all together. Today, he reports a 2 week history of worsening productive cough with clear sputum. Associated symptoms include sob, sore throat from coughing, runny nose, congestion, slight headache, sinus pressure. Admits that this feels like his previous COPD exacerbation. He has tried increasing his albuterol to twice a day, he's used his home nebulizer as well with some relief but symptoms not resolving completely. Denies fevers, chills, chest pain, palpitations, heart racing, n/v, diarrhea, ear pain or drainage, recent sick contacts prior to becoming sob. Of note, Mr. Vanlanen has not been able to go pulmonary rehab due to needing to have surgery on right knee and left elbow. Is planning on scheduling surgical procedures so he could return to rehab which he states significantly helped his COPD.  Glucose intolerance - diet has been good, cutting out sweets, eating healthier. Exercise is limited. Not currently on medications.  HTN and HL - reports medication compliance. Avoids added salt in his diet, otherwise diet and exercise as above.  Cardiovascular Disease - seeing cardiologist next month. No other aggravating or relieving factors.  Review of Systems  As in subjective.     Objective:   Physical Exam  Constitutional: He is oriented to person, place, and time. He appears well-developed. No distress.  BP 124/69  Pulse 85  Temp(Src) 98.3 F (36.8 C) (Oral)  Resp 18  Ht 5' 9.5" (1.765 m)  Wt 153 lb 9.6 oz (69.673 kg)  BMI 22.37 kg/m2  SpO2 93%  BP Readings from Last 3  Encounters: 09/30/14 : 124/69 06/17/14 : 120/58 05/10/14 : 140/86   HENT:  Head: Normocephalic and atraumatic.  Right Ear: External ear normal.  Left Ear: External ear normal.  Mouth/Throat: No oropharyngeal exudate.  Somewhat dry mucous mebranes  Eyes: Conjunctivae and EOM are normal. Right eye exhibits no discharge. Left eye exhibits no discharge. No scleral icterus.  Neck: Normal range of motion. Neck supple.  Cardiovascular: Normal rate and regular rhythm.  Exam reveals no gallop and no friction rub.   No murmur heard. Distant heart sounds.  Pulmonary/Chest: No respiratory distress. He has wheezes (mild-moderate expiratory wheezes). He has no rales.  Distant lung sounds, barrel chest  Abdominal: Soft. Bowel sounds are normal. He exhibits no distension. There is no tenderness.  Musculoskeletal: He exhibits no edema and no tenderness.  Neurological: He is alert and oriented to person, place, and time.  Skin: Skin is warm and dry. No rash noted. He is not diaphoretic. No erythema.  Psychiatric: He has a normal mood and affect. His behavior is normal.   [facility] reading (PRIMARY) by  Dr. Tamala Julian and PA-Elecia Serafin.      Assessment & Plan:   1. COPD exacerbation 2. Cough - DG Chest 2 View; Future - PR INHAL RX, AIRWAY OBST/DX SPUTUM INDUCT - albuterol (PROVENTIL) (2.5 MG/3ML) 0.083% nebulizer solution 2.5 mg; Take 3 mLs (2.5 mg total) by nebulization once. - methylPREDNISolone acetate (DEPO-MEDROL) injection 80 mg; Inject 1 mL (80 mg total) into the muscle once. - albuterol (PROVENTIL) (2.5 MG/3ML) 0.083% nebulizer solution 2.5 mg;  Take 3 mLs (2.5 mg total) by nebulization once. - doxycycline (VIBRAMYCIN) 100 MG capsule; Take 1 capsule (100 mg total) by mouth 2 (two) times daily.  Dispense: 20 capsule; Refill: 0 - predniSONE (DELTASONE) 20 MG tablet; Two tablets daily x 5 days then one tablet daily x 5 days  Dispense: 15 tablet; Refill: 0  3. Essential hypertension Stable, continue  losartan and diet modifications, repeat labs today - CBC with Differential - Comprehensive metabolic panel  4. Hyperlipidemia Not controlled per 04/2014 labs, repeat labs today - CBC with Differential - Comprehensive metabolic panel  5. Glucose intolerance (impaired glucose tolerance) Stable, repeat labs today - CBC with Differential - Comprehensive metabolic panel - Hemoglobin A1c   Jaynee Eagles, PA-C [facility] [phone] 09/30/2014 5:56 PM

## 2014-10-01 NOTE — Progress Notes (Signed)
History and physical examinations obtained iwht Jaynee Eagles, PA-C.  [facility] reading (PRIMARY) by  Dr. Tamala Julian and PA Bess Harvest:  CXR: hyper-expanded lungs; no acute process.  A/P: 1 URI:  New.  No evidence of infiltrate on CXR; treat empirically with Doxycycline.  Rx for cough syrup.  2. COPD exacerbation moderate: s/p depomedrol in office; s/p Albuterol nebulizer in office; rx for Prednisone taper provided; recommend Albuterol qid scheduled for one week and then PRN: RTC immediately for acute worsening. 3. Glucose intolerance: stable; obtain labs; continue with dietary modification.  4. Hyperlipidemia: stable; obtain labs; continue statin.  5. CAD: stable; follow-up with cardiology next month.

## 2014-10-16 ENCOUNTER — Telehealth: Payer: Self-pay

## 2014-10-16 NOTE — Telephone Encounter (Signed)
Pharm sent req for Rx for sertraline w/note that pt is no longer seeing Dr Nicolasa Ducking and they are requesting Rx from Dr Tamala Julian. Dr Tamala Julian, I see that you have assessed pt for depression fairly recently 06/17/14. Please advise. Pended with current dose as written on Rx req.

## 2014-10-17 MED ORDER — SERTRALINE HCL 100 MG PO TABS: 200.0000 mg | ORAL_TABLET | Freq: Every day | ORAL | Status: DC

## 2014-10-20 ENCOUNTER — Encounter: Payer: Self-pay | Admitting: Pulmonary Disease

## 2014-11-04 ENCOUNTER — Ambulatory Visit (INDEPENDENT_AMBULATORY_CARE_PROVIDER_SITE_OTHER): Payer: BC Managed Care – PPO | Admitting: Cardiovascular Disease

## 2014-11-04 ENCOUNTER — Encounter: Payer: Self-pay | Admitting: Cardiovascular Disease

## 2014-11-04 VITALS — BP 112/70 | HR 84 | Ht 69.5 in | Wt 157.0 lb

## 2014-11-04 DIAGNOSIS — E785 Hyperlipidemia, unspecified: Secondary | ICD-10-CM

## 2014-11-04 DIAGNOSIS — R0989 Other specified symptoms and signs involving the circulatory and respiratory systems: Secondary | ICD-10-CM

## 2014-11-04 DIAGNOSIS — I5022 Chronic systolic (congestive) heart failure: Secondary | ICD-10-CM

## 2014-11-04 DIAGNOSIS — I10 Essential (primary) hypertension: Secondary | ICD-10-CM

## 2014-11-04 DIAGNOSIS — I2510 Atherosclerotic heart disease of native coronary artery without angina pectoris: Secondary | ICD-10-CM

## 2014-11-04 NOTE — Assessment & Plan Note (Signed)
Lab Results  Component Value Date   CHOL 169 05/10/2014   HDL 37* 05/10/2014   LDLCALC 113* 05/10/2014   TRIG 94 05/10/2014   CHOLHDL 4.6 05/10/2014   He was not taking atorvastatin with most recent lipid profile was done. He is now taking the medication regularly. He will need repeat lipid profile. Recommend a target LDL of less than 70.

## 2014-11-04 NOTE — Assessment & Plan Note (Signed)
He is doing well overall with no symptoms suggestive of angina. Continue medical therapy. 

## 2014-11-04 NOTE — Assessment & Plan Note (Signed)
Blood pressure is controlled on losartan. 

## 2014-11-04 NOTE — Progress Notes (Signed)
Primary care physician: Dr. [name]  HPI  This is a 63 year old male who is here today for a followup visit. He has known history of coronary artery disease status post anterior myocardial infarction in December of 2010. His LAD was occluded and RCA had a 99% stenosis. He had an angioplasty and drug-eluting stent placement to both LAD and RCA at that time. 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 He is trying to quit.  Cardiac catheterization in 02/14  showed widely patent stents in the LAD and RCA with no evidence of other obstructive disease. Ejection fraction was 40% with mildly elevated left ventricular end-diastolic pressure. He has been doing reasonably well and denies any chest pain. No worsening dyspnea.  Unfortunately, he continues to smoke. He has been unable to quit smoking in spite of trying different methods and medications.   Allergies  Allergen Reactions  . Wellbutrin [Bupropion] Other (See Comments)    Unknown/"made me feel real funny"     Current Outpatient Prescriptions on File Prior to Visit  Medication Sig Dispense Refill  . ADVAIR DISKUS 500-50 MCG/DOSE AEPB USE 2 INHALATIONS INTO THE LUNGS  DAILY (DISCARD 30 DAYS AFTER   OPENING) 3 each 1  . albuterol (PROVENTIL) (2.5 MG/3ML) 0.083% nebulizer solution Take 3 mLs (2.5 mg total) by nebulization every 6 (six) hours as needed for wheezing or shortness of breath. 75 mL 12  . albuterol (VENTOLIN HFA) 108 (90 BASE) MCG/ACT inhaler Inhale 2 puffs into the lungs every 6 (six) hours as needed. For wheezing 54 g 3  . aspirin 81 MG tablet Take 81 mg by mouth daily.      [name] atorvastatin (LIPITOR) 20 MG tablet TAKE 1 TABLET DAILY 90 tablet 3  . chlorpheniramine-HYDROcodone (TUSSIONEX) 10-8 MG/5ML LQCR Take 5 mLs by mouth every 12 (twelve) hours as needed for cough. 180 mL 0  . clopidogrel (PLAVIX) 75 MG tablet Take 1 tablet (75 mg total) by mouth daily. 7 tablet 1  . esomeprazole (NEXIUM) 40 MG capsule Take  40 mg by mouth daily at 12 noon.    [name] LORazepam (ATIVAN) 0.5 MG tablet Take 1 tablet (0.5 mg total) by mouth as needed. 30 tablet 3  . losartan (COZAAR) 25 MG tablet Take 1 tablet (25 mg total) by mouth daily. 7 tablet 1  . nitroGLYCERIN (NITROSTAT) 0.4 MG SL tablet Place 1 tablet (0.4 mg total) under the tongue every 5 (five) minutes as needed. 25 tablet 3  . sertraline (ZOLOFT) 100 MG tablet Take 2 tablets (200 mg total) by mouth daily. 60 tablet 11  . tiotropium (SPIRIVA) 18 MCG inhalation capsule Place 1 capsule (18 mcg total) into inhaler and inhale daily. 90 capsule 3   Current Facility-Administered Medications on File Prior to Visit  Medication Dose Route Frequency Provider Last Rate Last Dose  . albuterol (PROVENTIL) (2.5 MG/3ML) 0.083% nebulizer solution 2.5 mg  2.5 mg Nebulization Once [name], MD      . albuterol (PROVENTIL) (2.5 MG/3ML) 0.083% nebulizer solution 2.5 mg  2.5 mg Nebulization Once [name], MD         Past Medical History  Diagnosis Date  . Hyperlipidemia   . COPD (chronic obstructive pulmonary disease)   . Depression   . GERD (gastroesophageal reflux disease)   . Colon polyps 08/2012    colonoscopy; multiple colon polyps; repeat colonoscopy in one year.  . Status post dilation of esophageal narrowing 2000  . Peptic ulcer   .  Hypertension   . CARDIOMYOPATHY 03/24/2010    did not tolerate Toprol and Lisinopril.   . Insomnia   . Hemorrhoids   . MI (myocardial infarction)   . Atherosclerosis   . Anxiety   . Headache(784.0)   . Tinea pedis   . Myalgia   . Helicobacter pylori (H. pylori)   . Thyroid dysfunction   . Chest pain   . Back pain   . Hernia     inguinal  . Allergic rhinitis   . Bronchitis     09-14-11  . Chronic airway obstruction, not elsewhere classified   . Coronary artery disease 11/2009    Anterior MI with late presentation 11/2009. Cath showed a 100% proximal LAD occlusion, 99% mid RCA. Had a PCI and 2 DES to both LAD and  RCA. EF was 35%. Nuclear stress test 05/13: prior anterior/inferior infarcts without ischemic, EF 43%, cardiac cath in 02/14: Widely patent stents with no other obstructive disease. EF 40%      Past Surgical History  Procedure Laterality Date  . Penile prosthesis implant    . Esophagogastroduodenoscopy  2008  . Colonoscopy    . Esophageal dilation    . Hernia repair  07/20/2012    L inguinal hernia repair  . Admission  12/20/2012    COPD exacerbation.  [place].  . Esophagogastroduodenoscopy  08/20/2012  . Cardiac catheterization    . Coronary angioplasty with stent placement  09/2009    LAD 3.0 X23 mm Xience DES, RCA: 4.0 X 15 mm Xience DES  . Cardiac catheterization  02/05/13    [facility]  . Cardiac catheterization  10/14    [facility] : patent stents with no change in anatomy. EF: 40$     Family History  Problem Relation Age of Onset  . Heart attack Brother     Brother #1  . Diabetes Brother     brother #2  . Hypertension Brother     #3  . Coronary artery disease Father 8    deceased  . Heart attack Father   . Diabetes Father   . Heart disease Father   . COPD Mother 27    deceased  . Heart attack Sister 66    acute-cocaine abuse/deceased  . Alcohol abuse Sister     polysubstance abuse  . COPD Sister   . Alcohol abuse Sister     polysubstance abuse  . Heart disease Brother     AMI x multiple     History   Social History  . Marital Status: Married    Spouse Name: N/A    Number of Children: 86  . 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 Smoking status: Current Every Day Smoker -- 2.00 packs/day for 42 years    Types: Cigarettes  . Smokeless tobacco: Never Used  . Alcohol Use: No  . Drug Use: No  . Sexual Activity: Yes   Other Topics Concern  . Not on file   Social History Narrative   Marital status: married x 32 years; happily married.      Children: 4 children;  1 stepchild and 15  grandchildren; no great grandchildren.      Lives: with wife; 2 dogs, 1 cat.      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      Tobacco: 1 ppd x 45 years      Alcohol: none; previous  alcoholism      Drugs: none      Exercise: no formal exercise; physically demanding job; owns horses.      Seatbelts:  100%      Guns: one loaded unsecured gun in the home.      Sunscreen: none      PHYSICAL EXAM   BP 112/70 mmHg  Pulse 84  Ht 5' 9.5" (1.765 m)  Wt 157 lb (71.215 kg)  BMI 22.86 kg/m2 Constitutional: He is oriented to person, place, and time. He appears well-developed and well-nourished. No distress.  HENT: No nasal discharge.  Head: Normocephalic and atraumatic.  Eyes: Pupils are equal and round. Right eye exhibits no discharge. Left eye exhibits no discharge.  Neck: Normal range of motion. Neck supple. No JVD present. No thyromegaly present. There is a faint left carotid bruit Cardiovascular: Normal rate, regular rhythm, normal heart sounds and. Exam reveals no gallop and no friction rub. No murmur heard.  Pulmonary/Chest: Effort normal and diminished breath sounds. No stridor. No respiratory distress. He has no wheezes. He has no rales. He exhibits no tenderness.  Abdominal: Soft. Bowel sounds are normal. He exhibits no distension. There is no tenderness. There is no rebound and no guarding.  Musculoskeletal: Normal range of motion. He exhibits no edema and no tenderness.  Neurological: He is alert and oriented to person, place, and time. Coordination normal.  Skin: Skin is warm and dry. No rash noted. He is not diaphoretic. No erythema. No pallor.  Psychiatric: He has a normal mood and affect. His behavior is normal. Judgment and thought content normal.      EKG: Sinus  Rhythm  -Old anterior infarct.   -Nonspecific ST depression   +   Nonspecific T-abnormality.   ABNORMAL   ASSESSMENT AND PLAN

## 2014-11-04 NOTE — Assessment & Plan Note (Signed)
I requested carotid Doppler especially with his prolonged history of tobacco use.

## 2014-11-04 NOTE — Assessment & Plan Note (Signed)
He appears to be euvolemic. Most recent ejection fraction was 40-45%. He did not tolerate a beta blocker in the past and currently is on losartan.

## 2014-11-04 NOTE — Patient Instructions (Addendum)
Continue same medications.   Your physician has requested that you have a carotid duplex. This test is an ultrasound of the carotid arteries in your neck. It looks at blood flow through these arteries that supply the brain with blood. Allow one hour for this exam. There are no restrictions or special instructions.   Your physician wants you to follow-up in: 6 months.  You will receive a reminder letter in the mail two months in advance. If you don't receive a letter, please call our office to schedule the follow-up appointment.

## 2014-11-07 ENCOUNTER — Telehealth: Payer: Self-pay

## 2014-11-07 MED ORDER — TIOTROPIUM BROMIDE MONOHYDRATE 18 MCG IN CAPS: 18.0000 ug | ORAL_CAPSULE | Freq: Every day | RESPIRATORY_TRACT | Status: DC

## 2014-11-07 MED ORDER — FLUTICASONE-SALMETEROL 500-50 MCG/DOSE IN AEPB: 2.0000 | INHALATION_SPRAY | Freq: Every day | RESPIRATORY_TRACT | Status: DC

## 2014-11-07 MED ORDER — ALBUTEROL SULFATE HFA 108 (90 BASE) MCG/ACT IN AERS: 2.0000 | INHALATION_SPRAY | Freq: Four times a day (QID) | RESPIRATORY_TRACT | Status: DC | PRN

## 2014-11-07 NOTE — Telephone Encounter (Signed)
Pt called and wanted to know if we could RF his Advair, Spriva, and Ventolin. Ok to do per Dr. Tamala Julian with 1 yr RF

## 2014-11-09 ENCOUNTER — Ambulatory Visit: Payer: Self-pay | Admitting: Physician Assistant

## 2014-11-11 ENCOUNTER — Encounter (INDEPENDENT_AMBULATORY_CARE_PROVIDER_SITE_OTHER): Payer: BC Managed Care – PPO

## 2014-11-11 DIAGNOSIS — R0989 Other specified symptoms and signs involving the circulatory and respiratory systems: Secondary | ICD-10-CM

## 2014-11-20 ENCOUNTER — Telehealth: Payer: Self-pay | Admitting: *Deleted

## 2014-11-20 DIAGNOSIS — I6523 Occlusion and stenosis of bilateral carotid arteries: Secondary | ICD-10-CM

## 2014-11-20 NOTE — Telephone Encounter (Signed)
-----   Message from Wellington Hampshire, MD sent at 11/16/2014  3:39 AM EST ----- Mild bilateral carotid stenosis. Recommend a follow up carotid doppler in 2 years.

## 2014-11-25 ENCOUNTER — Encounter: Payer: Self-pay | Admitting: Family Medicine

## 2014-11-25 ENCOUNTER — Ambulatory Visit (INDEPENDENT_AMBULATORY_CARE_PROVIDER_SITE_OTHER): Payer: BC Managed Care – PPO | Admitting: Family Medicine

## 2014-11-25 VITALS — BP 118/62 | HR 74 | Temp 98.0°F | Resp 16 | Ht 68.75 in | Wt 154.4 lb

## 2014-11-25 DIAGNOSIS — E785 Hyperlipidemia, unspecified: Secondary | ICD-10-CM

## 2014-11-25 DIAGNOSIS — J441 Chronic obstructive pulmonary disease with (acute) exacerbation: Secondary | ICD-10-CM

## 2014-11-25 DIAGNOSIS — Z23 Encounter for immunization: Secondary | ICD-10-CM | POA: Diagnosis not present

## 2014-11-25 DIAGNOSIS — Z720 Tobacco use: Secondary | ICD-10-CM

## 2014-11-25 DIAGNOSIS — J439 Emphysema, unspecified: Secondary | ICD-10-CM

## 2014-11-25 MED ORDER — LEVOFLOXACIN 750 MG PO TABS: 750.0000 mg | ORAL_TABLET | Freq: Every day | ORAL | Status: DC

## 2014-11-25 MED ORDER — PREDNISONE 20 MG PO TABS: ORAL_TABLET | ORAL | Status: DC

## 2014-11-25 NOTE — Progress Notes (Addendum)
Subjective:    Patient ID: Leslie Sims, male    DOB: 29-Jan-1951, 63 y.o.   MRN: [id number]  11/25/2014  Follow-up   HPI This 63 y.o. male presents for cough, cold symptoms. Onset three weeks ago.  NO fever/chills/sweats.  Slight headache.  No ear pain.  Slight sore throat; intermittent; no pain with swallowing.  Mild rhinorrhea.  Nasal congestion.  Horrible cough.  Sputum white.  Wheezing a lot.  Using nebulizer four times a day.  Using Advair and Spiriva.  Smoking 1 ppd.  Holidays are bad time. Mother passed 12/3.   [facility]; prescribed Prednisone, Levaquin 750mg  daily.  Scant improvement.  Mucous more discolored.  Last nebulizer treatment last night.    NeedS flu vaccine.   Burning sensation in chest: increased lately.  Worried about lung cancer.  Requesting low dose CT.    R knee surgery:  To schedule knee surgery after Christmas.    Hyperlipidemia:  Patient reports good compliance with medication, good tolerance to medication, and good symptom control.  Non-fasting today.   Glucose Intolerance:  Not watching diet very closely; drinking 1/2 and 1/2 sweet tea.     Review of Systems  Constitutional: Negative for fever, chills, diaphoresis, activity change, appetite change and fatigue.  HENT: Positive for congestion, postnasal drip, rhinorrhea and sore throat. Negative for ear pain.   Eyes: Negative for visual disturbance.  Respiratory: Positive for cough, shortness of breath and wheezing.   Cardiovascular: Negative for chest pain, palpitations and leg swelling.  Endocrine: Negative for cold intolerance, heat intolerance, polydipsia, polyphagia and polyuria.  Neurological: Positive for headaches. Negative for dizziness, tremors, seizures, syncope, facial asymmetry, speech difficulty, weakness, light-headedness and numbness.    Past Medical History  Diagnosis Date  . Hyperlipidemia   . COPD (chronic obstructive pulmonary disease)   . Depression   . GERD  (gastroesophageal reflux disease)   . Colon polyps 08/2012    colonoscopy; multiple colon polyps; repeat colonoscopy in one year.  . Status post dilation of esophageal narrowing 2000  . Peptic ulcer   . Hypertension   . CARDIOMYOPATHY 03/24/2010    did not tolerate Toprol and Lisinopril.   . Insomnia   . Hemorrhoids   . MI (myocardial infarction)   . Atherosclerosis   . Anxiety   . Headache(784.0)   . Tinea pedis   . Myalgia   . Helicobacter pylori (H. pylori)   . Thyroid dysfunction   . Chest pain   . Back pain   . Hernia     inguinal  . Allergic rhinitis   . Bronchitis     09-14-11  . Chronic airway obstruction, not elsewhere classified   . Coronary artery disease 11/2009    Anterior MI with late presentation 11/2009. Cath showed a 100% proximal LAD occlusion, 99% mid RCA. Had a PCI and 2 DES to both LAD and RCA. EF was 35%. Nuclear stress test 05/13: prior anterior/inferior infarcts without ischemic, EF 43%, cardiac cath in 02/14: Widely patent stents with no other obstructive disease. EF 40%    Past Surgical History  Procedure Laterality Date  . Penile prosthesis implant    . Esophagogastroduodenoscopy  2008  . Colonoscopy    . Esophageal dilation    . Hernia repair  07/20/2012    L inguinal hernia repair  . Admission  12/20/2012    COPD exacerbation.  [place].  . Esophagogastroduodenoscopy  08/20/2012  . Cardiac catheterization    . Coronary angioplasty with  stent placement  09/2009    LAD 3.0 X23 mm Xience DES, RCA: 4.0 X 15 mm Xience DES  . Cardiac catheterization  02/05/13    [facility]  . Cardiac catheterization  10/14    [facility] : patent stents with no change in anatomy. EF: 40$   Allergies  Allergen Reactions  . Wellbutrin [Bupropion] Other (See Comments)    Unknown/"made me feel real funny"   Current Outpatient Prescriptions  Medication Sig Dispense Refill  . albuterol (VENTOLIN HFA) 108 (90 BASE) MCG/ACT inhaler Inhale 2 puffs into the lungs every 6 (six) hours as needed.  For wheezing 54 g 11  . aspirin 81 MG tablet Take 81 mg by mouth daily.      . clopidogrel (PLAVIX) 75 MG tablet Take 1 tablet (75 mg total) by mouth daily. 7 tablet 1  . esomeprazole (NEXIUM) 40 MG capsule Take 40 mg by mouth daily at 12 noon.    . Fluticasone-Salmeterol (ADVAIR DISKUS) 500-50 MCG/DOSE AEPB Inhale 2 puffs into the lungs daily. 3 each 3  . LORazepam (ATIVAN) 0.5 MG tablet Take 1 tablet (0.5 mg total) by mouth as needed. 30 tablet 3  . losartan (COZAAR) 25 MG tablet Take 1 tablet (25 mg total) by mouth daily. 7 tablet 1  . nitroGLYCERIN (NITROSTAT) 0.4 MG SL tablet Place 1 tablet (0.4 mg total) under the tongue every 5 (five) minutes as needed. 25 tablet 3  . sertraline (ZOLOFT) 100 MG tablet Take 2 tablets (200 mg total) by mouth daily. 60 tablet 11  . tiotropium (SPIRIVA) 18 MCG inhalation capsule Place 1 capsule (18 mcg total) into inhaler and inhale daily. 90 capsule 3  . albuterol (PROVENTIL) (2.5 MG/3ML) 0.083% nebulizer solution ONE VIAL VIA NEBULIZER EVERY 6 HOURS AS NEEDED FOR WHEEZING OR SHORTNESS OF BREATH 225 mL 4  . atorvastatin (LIPITOR) 20 MG tablet TAKE 1 TABLET DAILY 90 tablet 3  . azithromycin (ZITHROMAX) 250 MG tablet Two tablets daily x 1 day, then one tablet daily x 4 days 6 tablet 0  . chlorpheniramine-HYDROcodone (TUSSIONEX) 10-8 MG/5ML LQCR Take 5 mLs by mouth every 12 (twelve) hours as needed for cough. 180 mL 0  . predniSONE (DELTASONE) 20 MG tablet TAKE 3 TABLETS BY MOUTH ONCE DAILY FOR 1DAY, THEN 2 TABLETS ONCE DAILY FOR 5 DAYS, THEN 1 TABLET ONCE DAILY FOR 5 DAYS 18 tablet 0   Current Facility-Administered Medications  Medication Dose Route Frequency Provider Last Rate Last Dose  . albuterol (PROVENTIL) (2.5 MG/3ML) 0.083% nebulizer solution 2.5 mg  2.5 mg Nebulization Once [name], MD      . albuterol (PROVENTIL) (2.5 MG/3ML) 0.083% nebulizer solution 2.5 mg  2.5 mg Nebulization Once [name], MD           Objective:    BP 118/62  mmHg  Pulse 74  Temp(Src) 98 F (36.7 C) (Oral)  Resp 16  Ht 5' 8.75" (1.746 m)  Wt 154 lb 6.4 oz (70.035 kg)  BMI 22.97 kg/m2  SpO2 90% Physical Exam  Constitutional: He is oriented to person, place, and time. He appears well-developed and well-nourished. No distress.  HENT:  Head: Normocephalic and atraumatic.  Right Ear: External ear normal.  Left Ear: External ear normal.  Nose: Nose normal.  Mouth/Throat: Oropharynx is clear and moist.  Eyes: Conjunctivae and EOM are normal. Pupils are equal, round, and reactive to light.  Neck: Normal range of motion. Neck supple. Carotid bruit is not present. No thyromegaly present.  Cardiovascular: Normal rate, regular rhythm, normal heart sounds and intact distal pulses.  Exam reveals no gallop and no friction rub.   No murmur heard. Pulmonary/Chest: Effort normal. No respiratory distress. He has wheezes. He has no rales.  Abdominal: Soft. Bowel sounds are normal. He exhibits no distension and no mass. There is no tenderness. There is no rebound and no guarding.  Lymphadenopathy:    He has no cervical adenopathy.  Neurological: He is alert and oriented to person, place, and time. No cranial nerve deficit.  Skin: Skin is warm and dry. No rash noted. He is not diaphoretic.  Psychiatric: He has a normal mood and affect. His behavior is normal.  Nursing note and vitals reviewed.   INFLUENZA VACCINE ADMINISTERED.  PREVNAR-13 ADMINISTERED.    Assessment & Plan:   1. Need for prophylactic vaccination and inoculation against influenza   2. Pulmonary emphysema, unspecified emphysema type   3. Tobacco abuse   4. Need for prophylactic vaccination against Streptococcus pneumoniae (pneumococcus)   5. COPD exacerbation   6. Hyperlipidemia      1. COPD exacerbation: New.  Rx for Prednisone, Levaquin 750mg  provided; increase nebulizer to qid dosing for one week and then PRN.  RTC for acute worsening. 2.  Hyperlipidemia: controlled; continue  current medications; non-fasting today; will RTC next OV fasting. 3.  Tobacco abuse: encourage cessation. 4.  S/p flu vaccine. 5. S/p Prevnar 13.    Meds ordered this encounter  Medications  . DISCONTD: predniSONE (DELTASONE) 20 MG tablet    Sig: Two tablets daily x 5 days then one tablet daily x 5 days    Dispense:  15 tablet    Refill:  0  . DISCONTD: predniSONE (DELTASONE) 20 MG tablet    Sig: Two tablets daily x 5 days then one tablet daily x 5 days    Dispense:  15 tablet    Refill:  0  . DISCONTD: levofloxacin (LEVAQUIN) 750 MG tablet    Sig: Take 1 tablet (750 mg total) by mouth daily.    Dispense:  10 tablet    Refill:  0  . DISCONTD: levofloxacin (LEVAQUIN) 750 MG tablet    Sig:   . DISCONTD: predniSONE (DELTASONE) 20 MG tablet    Sig:     Return in about 3 months (around 02/24/2015) for recheck CHOLESTEROL.    [name], M.D.  [facility] [address] [place], [place]  [address] [phone] phone [phone] fax

## 2014-11-26 ENCOUNTER — Other Ambulatory Visit: Payer: Self-pay | Admitting: Radiology

## 2014-11-26 DIAGNOSIS — J441 Chronic obstructive pulmonary disease with (acute) exacerbation: Secondary | ICD-10-CM

## 2014-12-04 ENCOUNTER — Other Ambulatory Visit: Payer: BC Managed Care – PPO

## 2014-12-11 ENCOUNTER — Other Ambulatory Visit: Payer: Self-pay | Admitting: Family Medicine

## 2014-12-18 ENCOUNTER — Inpatient Hospital Stay: Admission: RE | Admit: 2014-12-18 | Payer: BC Managed Care – PPO | Source: Ambulatory Visit

## 2014-12-18 ENCOUNTER — Ambulatory Visit
Admission: RE | Admit: 2014-12-18 | Discharge: 2014-12-18 | Disposition: A | Discharge status: Home / Self Care | Payer: BC Managed Care – PPO | Source: Ambulatory Visit | Attending: Family Medicine | Admitting: Family Medicine

## 2014-12-18 ENCOUNTER — Other Ambulatory Visit: Payer: Self-pay | Admitting: Family Medicine

## 2014-12-18 DIAGNOSIS — J441 Chronic obstructive pulmonary disease with (acute) exacerbation: Secondary | ICD-10-CM

## 2014-12-18 IMAGING — CT CT CHEST W/O CM
3 of 4 series · 17 of 30 positions shown, 19 images · non-contrast
Comparison: Chest x-ray dated [DATE]

CLINICAL DATA: COPD exacerbation.  Cough.

EXAM:
CT CHEST WITHOUT CONTRAST
TECHNIQUE: Multidetector CT imaging of the chest was performed following the
standard protocol without IV contrast..

[Series 3: chest w/o · axial · non-contrast · 0.80mm/px · z∈[-297,-82]mm · 4 of 73 slices shown]
[im 15/73  lung]
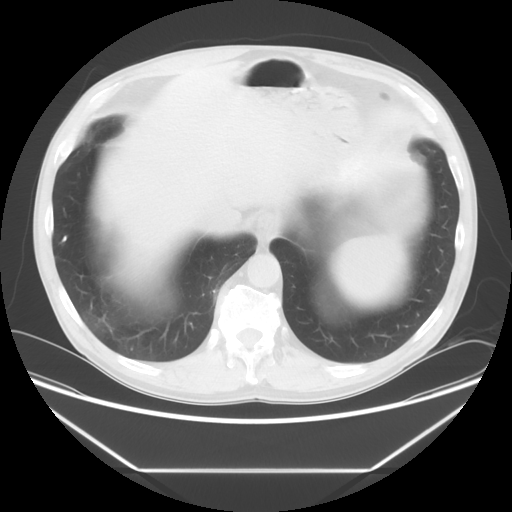
[im 29/73  lung]
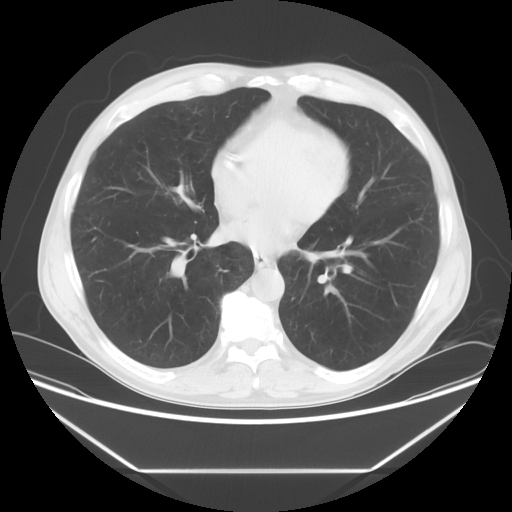
[im 44/73  lung]
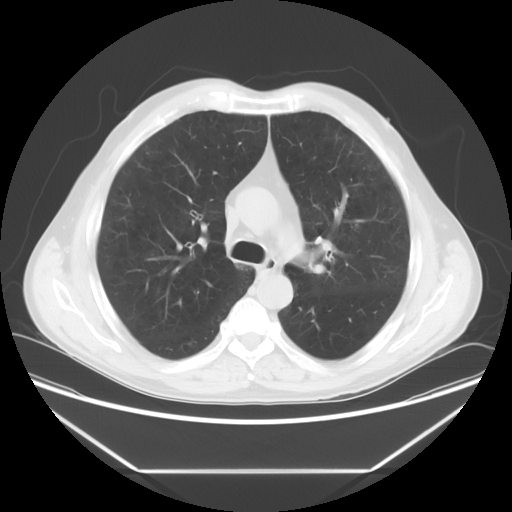
[im 58/73  lung]
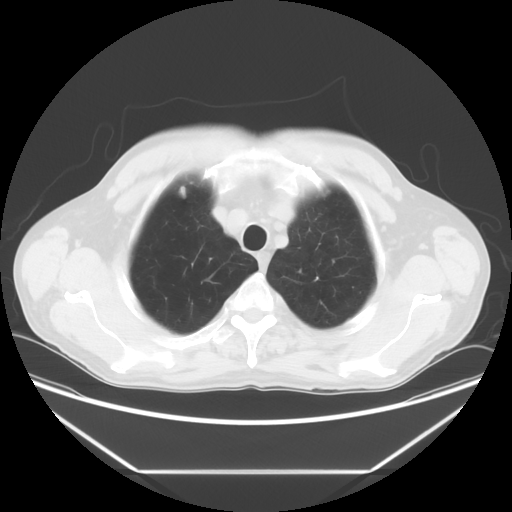

[Series 4: lung windows · axial · 0.80mm/px · z∈[-307,-67]mm · 5 of 73 slices shown, 7 images]
[im 13/73  mediastinal]
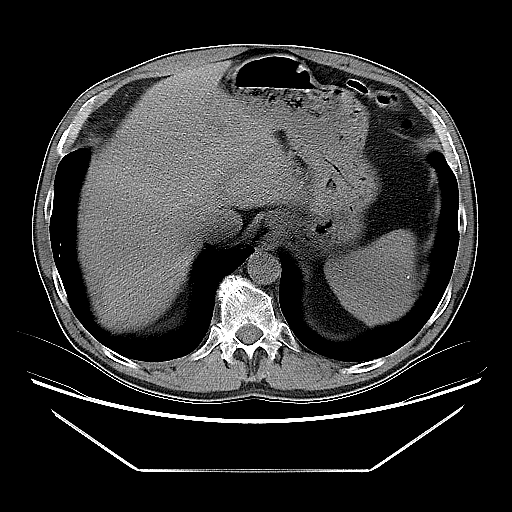
[im 13/73  lung]
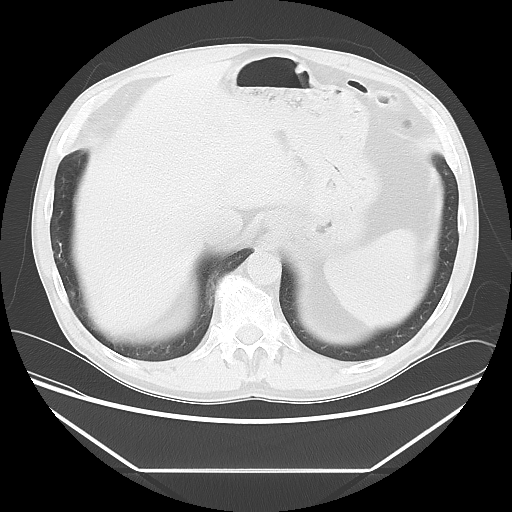
[im 25/73  lung]
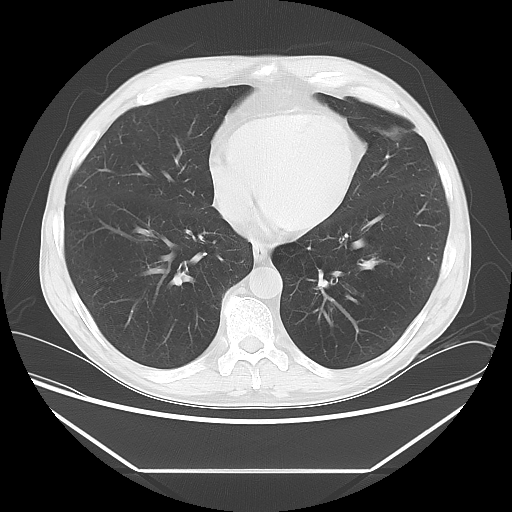
[im 37/73  lung]
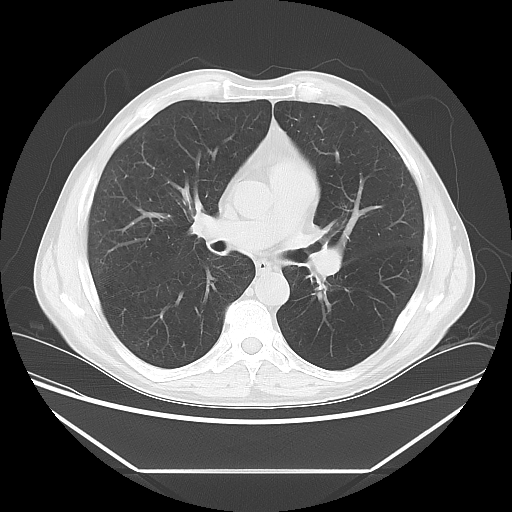
[im 49/73  lung]
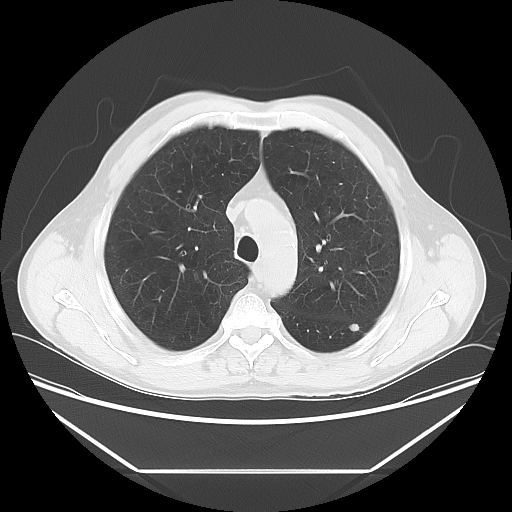
[im 61/73  mediastinal]
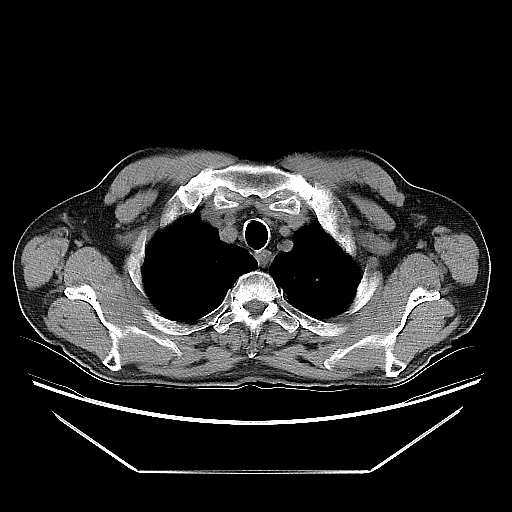
[im 61/73  lung]
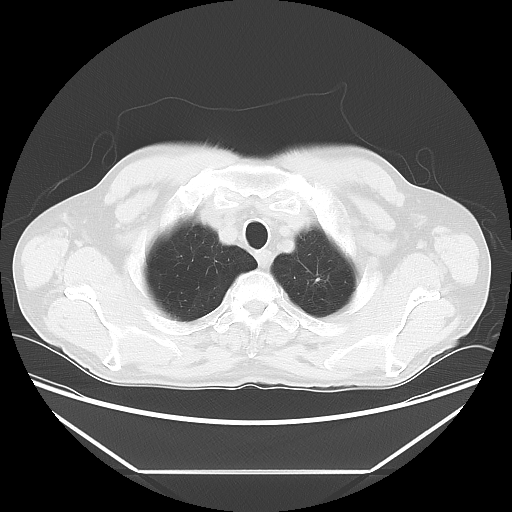

[Series 602: sagittal body · sagittal · 0.80mm/px · 8 of 166 slices shown]
[im 12/166  mediastinal]
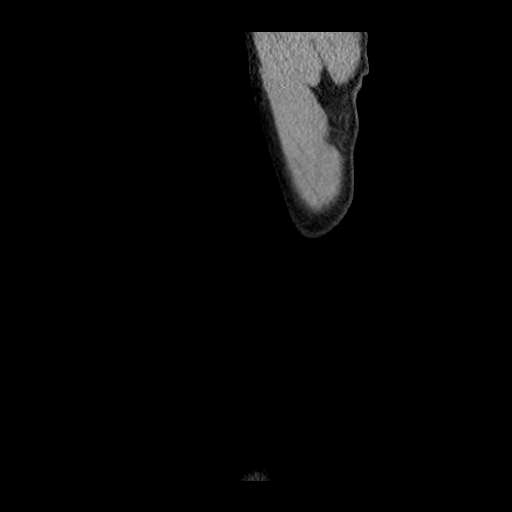
[im 36/166  mediastinal]
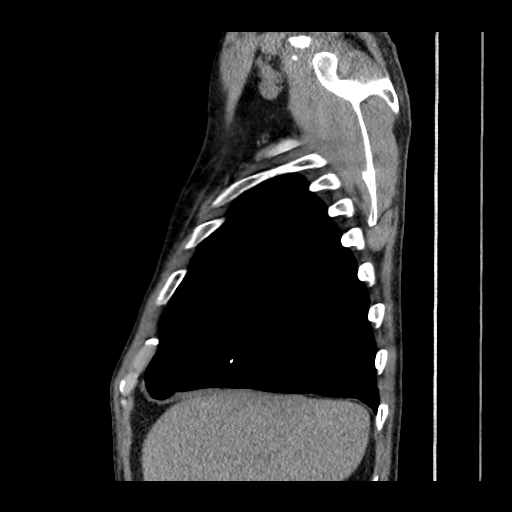
[im 59/166  mediastinal]
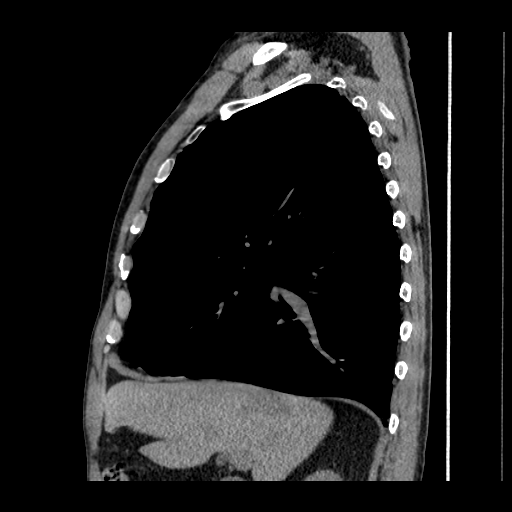
[im 71/166  mediastinal]
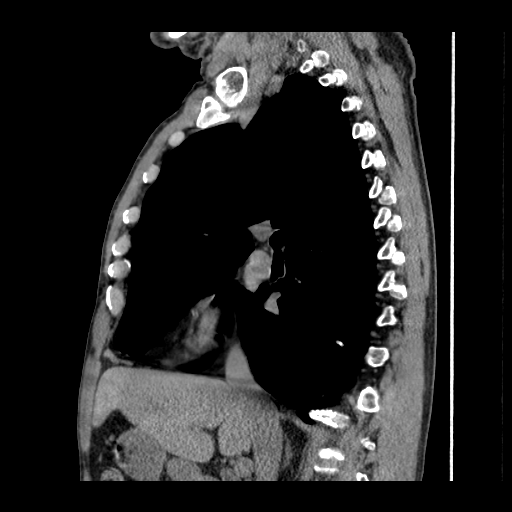
[im 95/166  mediastinal]
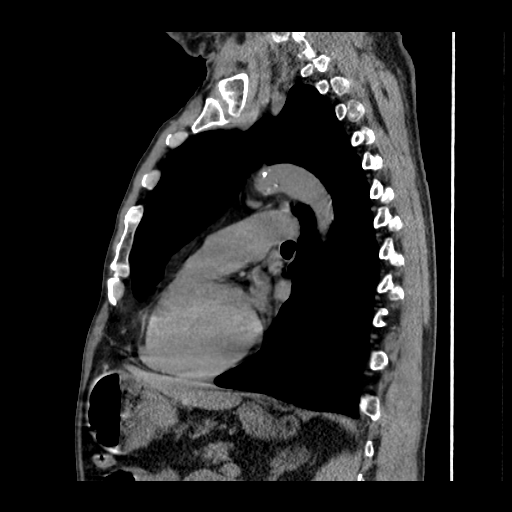
[im 107/166  mediastinal]
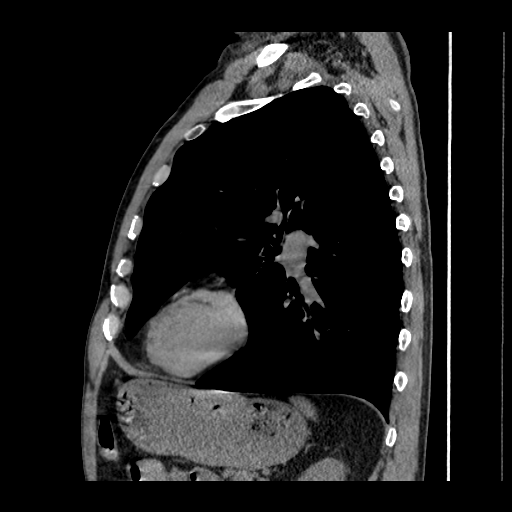
[im 130/166  mediastinal]
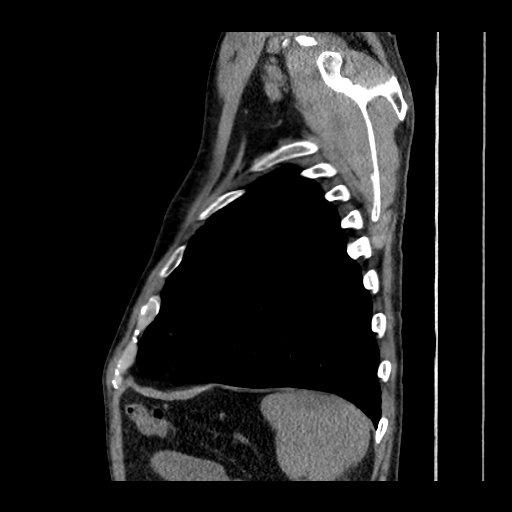
[im 154/166  mediastinal]
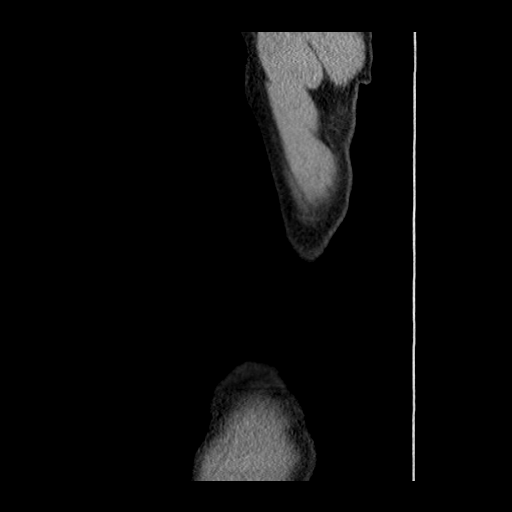

[17 of 30 positions shown; findings below may reference images not displayed]

FINDINGS: There is a 7 mm nodule in the superior segment of the right lower
lobe with an adjacent 5 mm nodule slightly more medial. These are
seen on images 23 and 25 of series 4. There is also a 4 mm nodule at
the left lung apex on image 13 of series 4.

There is diffuse emphysema in both lungs. There are some calcified
vessels at the right lung base, probably representing calcification
of chronically thrombosed arteries.

Heart size and pulmonary vascularity are normal. Coronary artery
stent is noted. No osseous abnormality. The visualized portion of
the upper abdomen demonstrates calcified granulomas in the spleen
and a 2.8 cm adenoma on the left adrenal gland. There is a 2.3 cm
adenoma on the right adrenal gland.
IMPRESSION: 1. Extensive emphysematous changes throughout both lungs.
2. Small indeterminate nodules in the left upper and lower lobes. If
the patient is at high risk for bronchogenic carcinoma, follow-up
chest CT at 3-6 months is recommended. If the patient is at low risk
for bronchogenic carcinoma, follow-up chest CT at 6-12 months is
recommended. This recommendation follows the consensus statement:
Guidelines for Management of Small Pulmonary Nodules Detected on CT
Scans: A Statement from the [HOSPITAL] as published in

## 2014-12-24 ENCOUNTER — Telehealth: Payer: Self-pay | Admitting: Family Medicine

## 2014-12-24 MED ORDER — LEVOFLOXACIN 750 MG PO TABS: 750.0000 mg | ORAL_TABLET | Freq: Every day | ORAL | Status: DC

## 2014-12-24 MED ORDER — PREDNISONE 20 MG PO TABS: ORAL_TABLET | ORAL | Status: DC

## 2014-12-24 NOTE — Telephone Encounter (Signed)
Pt called--- two days ago, onset of chest congestion and head congestion.  No fever/chills/sweats. Mild headache; mild sore throat.  +rhinorrhea; +nasal congestion. +cough. +sputum production increased amount and change in color. +worsening DOE. Using Abluterol tid.  Compliance with COPD medications.  Wife recovering from aortic aneurysm dissection and pt unable to come to office for evaluation.  A/P:  URI with COPD exacerbation: New. Levaquin, Prednisone. Increase Albuterol to qid. To office for worsening DOE, wheezing.

## 2014-12-31 ENCOUNTER — Other Ambulatory Visit: Payer: Self-pay | Admitting: Family Medicine

## 2015-01-02 ENCOUNTER — Ambulatory Visit (INDEPENDENT_AMBULATORY_CARE_PROVIDER_SITE_OTHER): Payer: BLUE CROSS/BLUE SHIELD

## 2015-01-02 ENCOUNTER — Ambulatory Visit (INDEPENDENT_AMBULATORY_CARE_PROVIDER_SITE_OTHER): Payer: BLUE CROSS/BLUE SHIELD | Admitting: Family Medicine

## 2015-01-02 VITALS — BP 100/62 | HR 87 | Temp 98.4°F | Resp 20 | Ht 68.75 in | Wt 160.2 lb

## 2015-01-02 DIAGNOSIS — J22 Unspecified acute lower respiratory infection: Secondary | ICD-10-CM

## 2015-01-02 DIAGNOSIS — R059 Cough, unspecified: Secondary | ICD-10-CM

## 2015-01-02 DIAGNOSIS — R918 Other nonspecific abnormal finding of lung field: Secondary | ICD-10-CM

## 2015-01-02 DIAGNOSIS — J441 Chronic obstructive pulmonary disease with (acute) exacerbation: Secondary | ICD-10-CM

## 2015-01-02 DIAGNOSIS — R05 Cough: Secondary | ICD-10-CM

## 2015-01-02 DIAGNOSIS — J988 Other specified respiratory disorders: Secondary | ICD-10-CM

## 2015-01-02 IMAGING — CR DG CHEST 2V
2 series · 2 of 2 positions shown · non-contrast
Comparison: Chest CT [DATE]

CLINICAL DATA: Cough and chest congestion.  COPD exacerbation.

EXAM:
CHEST  2 VIEW

[PA]
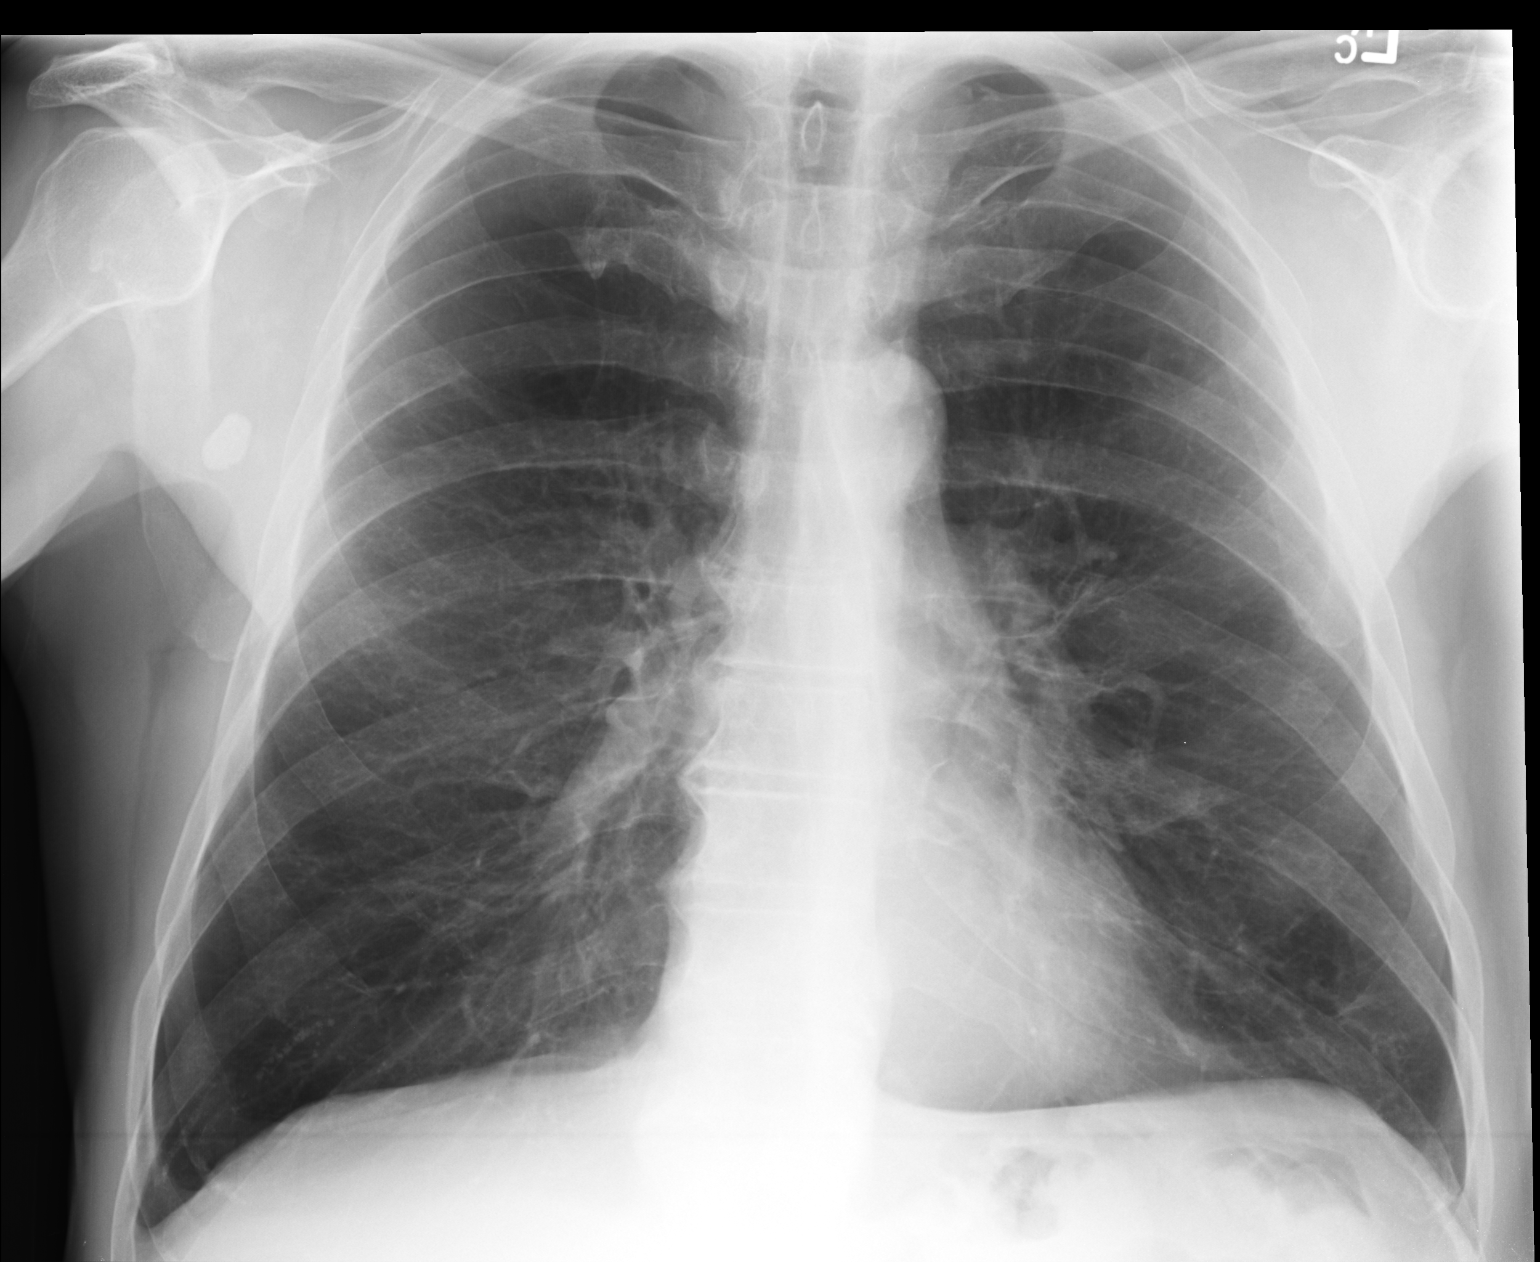

[lateral]
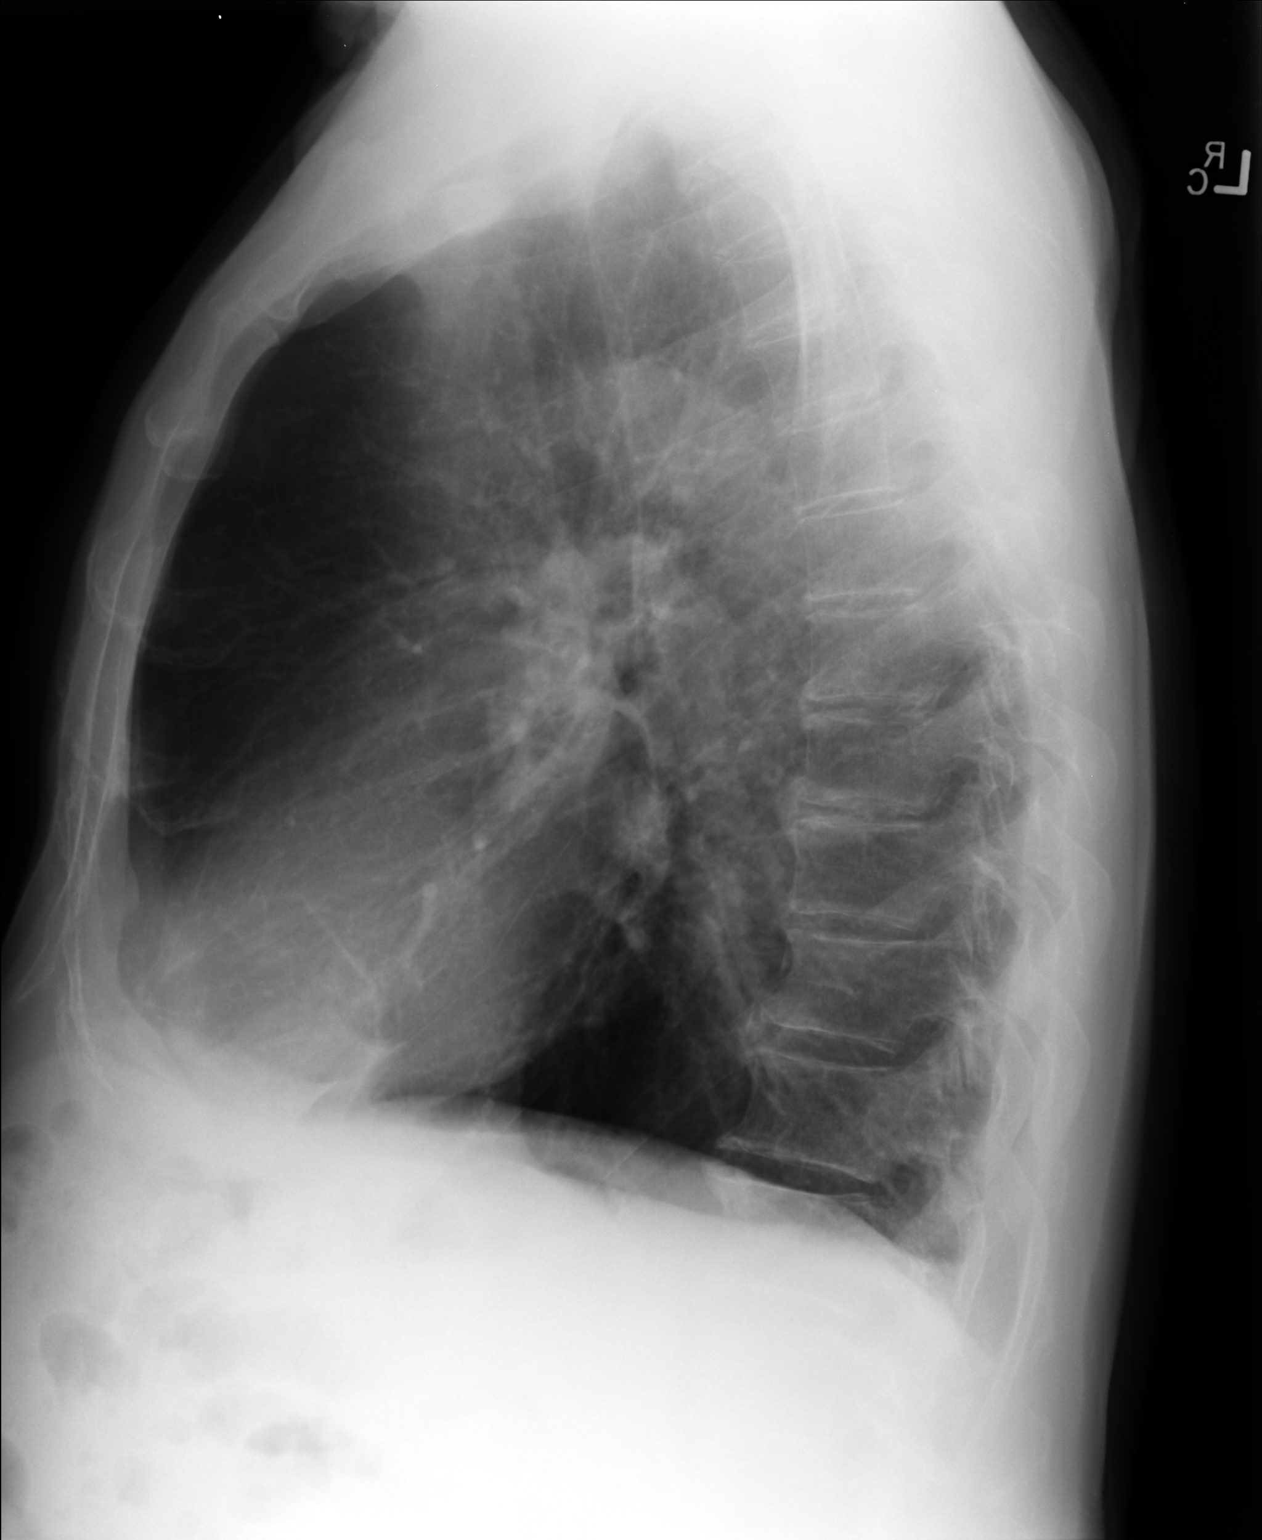

[2 of 2 positions shown; findings below may reference images not displayed]

FINDINGS: The lungs remain hyperinflated with emphysema. The cardiomediastinal
contours are normal. Pulmonary vasculature is normal. The small
pulmonary nodules on CT are not well seen. No consolidation, pleural
effusion, or pneumothorax. No acute osseous abnormalities are seen.
Bullet shaped density in the right axilla up
IMPRESSION: Emphysema and hyperinflation. No superimposed acute pulmonary
process.

## 2015-01-02 MED ORDER — AZITHROMYCIN 250 MG PO TABS: ORAL_TABLET | ORAL | Status: DC

## 2015-01-02 MED ORDER — ALBUTEROL SULFATE (2.5 MG/3ML) 0.083% IN NEBU: INHALATION_SOLUTION | RESPIRATORY_TRACT | Status: DC

## 2015-01-02 NOTE — Progress Notes (Addendum)
Subjective:  This chart was scribed for Leslie Forts, MD by Mercy Moore, Medial Scribe. This patient was seen in room 14 and the patient's care was started at 6:55 PM.   Patient ID: Leslie Sims, male    DOB: 1951-11-07, 64 y.o.   MRN: [id number]  01/02/2015  Cough; Shortness of Breath; and Nasal Congestion   HPI HPI Comments: Leslie Sims is a 64 y.o. male with PMHx of COPD, CAD, ongoing tobacco abuse who presents to the [facility] complaining of chest congestion for ten days now. Patient reports improvement of his symptoms since his last visit stating that he "doesn't have as much phlegm." Patient also reports cough, headache, right ear pain, sore throat likened to having something lodged in this throat, and rhinorrhea with light yellow discharge. Patient denies sinus pressure stating that he is using his Netti pot once a day. Patient reports nebulizer treatments 3 times a day. Patient reports taking the Levaquin and Prednisone prescribed at his last visit but he did not use the Carafate due to interaction warnings on the label. Patient denies fever, chills, and sweats. Patient reports fleeting central chest pain and shortness of breath with exertion. Patient describes dull burning sensation in his chest; patient denies pain with exertion and states the pain presents spontaneously. Patient reports that he has an upcoming appointment with pulmonology on Monday, in four days. Patient denies painting, pressure washing, or cleaning dusty area recently. Patient reports that when he is working he wears a mask, but he feels that it "isn't filtering enough." Patient agrees that he will stop smoking and states he is working on it.  Wife is currently recovering from aortic aneurysm dissection.  Chest congestion is still significant and different from baseline.  Pt s/p CT lungs which revealed small nodules in left upper and lower lobes; repeat CT recommended in 3-6 months.   Pt unable to attend pulmonary rehab due to bad knee.  Review of Systems  Constitutional: Negative for fever, chills, diaphoresis and fatigue.  HENT: Positive for congestion, ear pain, postnasal drip, rhinorrhea, sore throat, trouble swallowing and voice change. Negative for ear discharge, sinus pressure and tinnitus.   Respiratory: Positive for cough, chest tightness, shortness of breath and wheezing.   Gastrointestinal: Negative for nausea, vomiting, abdominal pain and diarrhea.  Skin: Negative for rash.  Neurological: Positive for headaches.    Past Medical History  Diagnosis Date   Hyperlipidemia    COPD (chronic obstructive pulmonary disease)    Depression    GERD (gastroesophageal reflux disease)    Colon polyps 08/2012    colonoscopy; multiple colon polyps; repeat colonoscopy in one year.   Status post dilation of esophageal narrowing 2000   Peptic ulcer    Hypertension    CARDIOMYOPATHY 03/24/2010    did not tolerate Toprol and Lisinopril.    Insomnia    Hemorrhoids    MI (myocardial infarction)    Atherosclerosis    Anxiety    Headache(784.0)    Tinea pedis    Myalgia    Helicobacter pylori (H. pylori)    Thyroid dysfunction    Chest pain    Back pain    Hernia     inguinal   Allergic rhinitis    Bronchitis     09-14-11   Chronic airway obstruction, not elsewhere classified    Coronary artery disease 11/2009    Anterior MI with late presentation 11/2009. Cath showed a 100% proximal LAD occlusion, 99%  mid RCA. Had a PCI and 2 DES to both LAD and RCA. EF was 35%. Nuclear stress test 05/13: prior anterior/inferior infarcts without ischemic, EF 43%, cardiac cath in 02/14: Widely patent stents with no other obstructive disease. EF 40%    Past Surgical History  Procedure Laterality Date   Penile prosthesis implant     Esophagogastroduodenoscopy  2008   Colonoscopy     Esophageal dilation     Hernia repair  07/20/2012    L inguinal hernia  repair   Admission  12/20/2012    COPD exacerbation.  [place].   Esophagogastroduodenoscopy  08/20/2012   Cardiac catheterization     Coronary angioplasty with stent placement  09/2009    LAD 3.0 X23 mm Xience DES, RCA: 4.0 X 15 mm Xience DES   Cardiac catheterization  02/05/13    [facility]   Cardiac catheterization  10/14    [facility] : patent stents with no change in anatomy. EF: 40$   Allergies  Allergen Reactions   Wellbutrin [Bupropion] Other (See Comments)    Unknown/"made me feel real funny"   Current Outpatient Prescriptions  Medication Sig Dispense Refill   albuterol (PROVENTIL) (2.5 MG/3ML) 0.083% nebulizer solution ONE VIAL VIA NEBULIZER EVERY 6 HOURS AS NEEDED FOR WHEEZING OR SHORTNESS OF BREATH 225 mL 4   albuterol (VENTOLIN HFA) 108 (90 BASE) MCG/ACT inhaler Inhale 2 puffs into the lungs every 6 (six) hours as needed. For wheezing 54 g 11   aspirin 81 MG tablet Take 81 mg by mouth daily.       atorvastatin (LIPITOR) 20 MG tablet TAKE 1 TABLET DAILY 90 tablet 3   clopidogrel (PLAVIX) 75 MG tablet Take 1 tablet (75 mg total) by mouth daily. 7 tablet 1   Fluticasone-Salmeterol (ADVAIR DISKUS) 500-50 MCG/DOSE AEPB Inhale 2 puffs into the lungs daily. 3 each 3   LORazepam (ATIVAN) 0.5 MG tablet Take 1 tablet (0.5 mg total) by mouth as needed. 30 tablet 3   losartan (COZAAR) 25 MG tablet Take 1 tablet (25 mg total) by mouth daily. 7 tablet 1   nitroGLYCERIN (NITROSTAT) 0.4 MG SL tablet Place 1 tablet (0.4 mg total) under the tongue every 5 (five) minutes as needed. 25 tablet 3   sertraline (ZOLOFT) 100 MG tablet Take 2 tablets (200 mg total) by mouth daily. 60 tablet 11   tiotropium (SPIRIVA) 18 MCG inhalation capsule Place 1 capsule (18 mcg total) into inhaler and inhale daily. 90 capsule 3   azithromycin (ZITHROMAX) 250 MG tablet Two tablets daily x 1 day, then one tablet daily x 4 days 6 tablet 0   CARAFATE 1 GM/10ML suspension TAKE 10ML( 2 TEASPOONSFUL) BY MOUTH FOUR  TIMES DAILY (Patient not taking: Reported on 01/02/2015) 420 mL 1   chlorpheniramine-HYDROcodone (TUSSIONEX) 10-8 MG/5ML LQCR Take 5 mLs by mouth every 12 (twelve) hours as needed for cough. (Patient not taking: Reported on 11/25/2014) 180 mL 0   esomeprazole (NEXIUM) 40 MG capsule Take 40 mg by mouth daily at 12 noon.     Current Facility-Administered Medications  Medication Dose Route Frequency Provider Last Rate Last Dose   albuterol (PROVENTIL) (2.5 MG/3ML) 0.083% nebulizer solution 2.5 mg  2.5 mg Nebulization Once [name], MD       albuterol (PROVENTIL) (2.5 MG/3ML) 0.083% nebulizer solution 2.5 mg  2.5 mg Nebulization Once [name], MD           Objective:    BP 100/62 mmHg   Pulse 87  Temp(Src) 98.4 F (36.9 C) (Oral)   Resp 20   Ht 5' 8.75" (1.746 m)   Wt 160 lb 4 oz (72.689 kg)   BMI 23.84 kg/m2   SpO2 95% Physical Exam  Constitutional: He is oriented to person, place, and time. He appears well-developed and well-nourished. No distress.  HENT:  Head: Normocephalic and atraumatic.  Right Ear: External ear normal.  Left Ear: External ear normal.  Nose: Nose normal.  Mouth/Throat: Oropharynx is clear and moist. No oropharyngeal exudate.  Eyes: Conjunctivae and EOM are normal. Pupils are equal, round, and reactive to light.  Neck: Neck supple. No tracheal deviation present.  Cardiovascular: Normal rate, regular rhythm and normal heart sounds.  Exam reveals no gallop and no friction rub.   No murmur heard. Pulmonary/Chest: Effort normal. No respiratory distress. He has no wheezes. He has no rales.  Breath sound diffusely distant throughout.   Abdominal: Soft. Bowel sounds are normal. He exhibits no distension and no mass. There is no tenderness. There is no rebound and no guarding.  Musculoskeletal: Normal range of motion. He exhibits no edema.  Lymphadenopathy:    He has no cervical adenopathy.  Neurological: He is alert and oriented to person, place, and time.    Skin: Skin is warm and dry. He is not diaphoretic.  Psychiatric: He has a normal mood and affect. His behavior is normal.  Nursing note and vitals reviewed.   [facility] reading (PRIMARY) by  Dr. Tamala Julian. CXR: NAD       Assessment & Plan:   1. Cough   2. Lower respiratory infection   3. COPD exacerbation   4. Pulmonary nodules     1. Lower respiratory infection: New.  S/p Levaquin and Prednisone.  Rx for Zpack provided.   2.  COPD exacerbation: new.  S/p Prednisone; complete taper; continue Albuterol nebulizer tid.  Continue Advair and Spiriva.  Unable to attend pulmonary rehab at current time due to knee pain.  Has appointment next week with new pulmonologist.  Highly recommend smoking cessation to help decrease frequency of exacerbations. 3.  Pulmonary nodules: New.  Warrants repeat CT in 3-6 months.    Meds ordered this encounter  Medications   azithromycin (ZITHROMAX) 250 MG tablet    Sig: Two tablets daily x 1 day, then one tablet daily x 4 days    Dispense:  6 tablet    Refill:  0   albuterol (PROVENTIL) (2.5 MG/3ML) 0.083% nebulizer solution    Sig: ONE VIAL VIA NEBULIZER EVERY 6 HOURS AS NEEDED FOR WHEEZING OR SHORTNESS OF BREATH    Dispense:  225 mL    Refill:  4    No Follow-up on file.    I personally performed the services described in this documentation, which was scribed in my presence. The recorded information has been reviewed and considered.   Kristi Elayne Guerin, M.D. [facility] [address] [place], [place]  [address] [phone] phone [phone] fax

## 2015-01-05 DIAGNOSIS — R918 Other nonspecific abnormal finding of lung field: Secondary | ICD-10-CM | POA: Insufficient documentation

## 2015-01-06 ENCOUNTER — Ambulatory Visit (INDEPENDENT_AMBULATORY_CARE_PROVIDER_SITE_OTHER): Payer: BLUE CROSS/BLUE SHIELD | Admitting: Internal Medicine

## 2015-01-06 ENCOUNTER — Encounter: Payer: Self-pay | Admitting: Internal Medicine

## 2015-01-06 VITALS — BP 124/62 | HR 78 | Temp 97.7°F | Ht 69.0 in | Wt 159.0 lb

## 2015-01-06 DIAGNOSIS — R05 Cough: Secondary | ICD-10-CM

## 2015-01-06 DIAGNOSIS — J309 Allergic rhinitis, unspecified: Secondary | ICD-10-CM

## 2015-01-06 DIAGNOSIS — F17200 Nicotine dependence, unspecified, uncomplicated: Secondary | ICD-10-CM

## 2015-01-06 DIAGNOSIS — J302 Other seasonal allergic rhinitis: Secondary | ICD-10-CM

## 2015-01-06 DIAGNOSIS — J449 Chronic obstructive pulmonary disease, unspecified: Secondary | ICD-10-CM

## 2015-01-06 DIAGNOSIS — R059 Cough, unspecified: Secondary | ICD-10-CM

## 2015-01-06 DIAGNOSIS — J3089 Other allergic rhinitis: Principal | ICD-10-CM

## 2015-01-06 DIAGNOSIS — Z720 Tobacco use: Secondary | ICD-10-CM

## 2015-01-06 NOTE — Assessment & Plan Note (Addendum)
Certainly due to COPD and chronic bronchitis, although there might be a allergic component that is contributing.  Plan: -continue COPD treatment. -may use Netti pot.

## 2015-01-06 NOTE — Assessment & Plan Note (Addendum)
Patient with stage IV COPD I believe he does have an allergic component along with tobacco use that is currently making his COPD exacerbations more frequent. Currently he is finishing another course of steroid and antibiotic, will plan to resume pulmonary rehabilitation in a few weeks.  Plan:  -educated again today to quit smoking -cont with pulmonary rehabilitation -Continue Spiriva, Advair, when necessary albuterol -check IgE level prior to next visit - may consider xolair and further testing for ABPA (this was initiated in 2014 but patient switched to [facility]) at next visit. -plan for PFTs and a 6 minute walk test prior to next visit -MMRC=0, CAT=24

## 2015-01-06 NOTE — Assessment & Plan Note (Signed)
Tobacco Cessation - Counseling regarding benefits of smoking cessation strategies was provided for more than 12 min. - Educated that at this time smoking- cessation represents the single most important step that patient can take to enhance the length and quality of live. - Educated patient regarding alternatives of behavior interventions, pharmacotherapy including NRT and non-nicotine therapy such, and combinations of both. - Patient at this time will try to quit on his own. 

## 2015-01-06 NOTE — Assessment & Plan Note (Signed)
Review of chart showed the following: Allergy Profile 05/18/2013-total IgE 664.8 with elevated specific antigens for almost everything on the panel.  Wears a mask to work now. 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  Patient stated he has moved since 2014, but has not noticed any improvement in the number of COPD exacerbations he is having.  Plan: -Check IgE levels -May consider reinitiating/continuing ABPA workup.  Will continue optimizing COPD treatment and reevaluate for ABPA at next visit if symptoms are not improving.

## 2015-01-06 NOTE — Patient Instructions (Signed)
Please call our [place] office to schedule a 2 month follow up. You will need to schedule your walk test and breathing test. You will need to go by [facility] on [address] For lab work.

## 2015-01-06 NOTE — Progress Notes (Signed)
Date: 01/06/2015  MRN# [id number] Leslie Sims [facility] [birth year]  CC: Chief Complaint  Patient presents with  . Advice Only    Pt switching Leslie. He was seeing pulmonologist at [facility]. He has COPD and has recurrent URI. Pt has cough with yellow mucus, wheezing, sob and chest tightness. He is on a zpack last day tomorrow.     Follow up clinic visit Brief History  38 yoM smoker followed by [facility] for COPD and referred now for allergy evaluation. He gives a history of allergic rhinitis with frequent head congestion and frequent head and chest infections. Remote history of hayfever. His primary physician has recommended daily Claritin and Flonase. He also uses a Neti pot for saline nasal rinse. He describes an episode where he used Leslie Sims through a Leslie Sims with no respiratory protection. He ended up at the hospital in respiratory distress. I believe that product is an oil, meaning he may have had a lipoid pneumonia at that time. Pt reports he has a lot of nasal congestion, PND, cough from drainage, itchy eyes. He denies allergy problems with skin, insect bites or foods. Complicated medical problems include coronary artery disease/stent/MI, hypertension, GERD with esophageal dilatation.  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 Sleep study in [place] 3 weeks ago. We are seeking report for this file. He complains of persistent fatigue "no energy", taking Ativan and sertraline x2 at bedtime. Does not sleep on his back. No effort to stop smoking. Blames nose and chest congestion" weather", continuing Flonase. Allergy Profile 05/18/2013-total IgE 664.8 with elevated specific antigens for almost  everything on the panel. Wears a mask to mow. O2 2l/ Sleep Note especially high IgE level for Aspergillus.  05/14/2014 [facility] - Leslie Sims History of Present Illness: Leslie Sims is a 64 y.o. male that presents to clinic for Stage IV COPD. 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 Today have a little difficulty. Out of his meds x 1 week. Just got them today. That is wheezing, coughing and dyspnea. Still smoking, 8-18 cigarettes a day. Works in home improvement. No chest pain. No calf pain or leg swelling. No blood. No recent travel. Appetite is good. No weight loss. No bone pain. Completing a course of levaquin. Nicotine cessation education given. On nicotrol.  Had pneumonia shot 3 years or so.  STAGE IV COPD, stable. Recent flare probable due to lack of meds.  Plan: -stop smoking -continue and stay present medications -resume Daliresp  -follow up in 4-5 months -flu shot annually   HPI:  Patient is a pleasant 64 year old male presents today for a followup visit. He was previously following with this office in 2014 and then switched to [facility] (Leslie Sims) in 2015. Brief history of his pulmonary issues-history of stage IV COPD currently on Advair, Spiriva, as needed albuterol. Noticed increasing exacerbations around 2013 was following with Leslie Sims and then Leslie Sims, noted to have increased IgE levels and Aspergillus skin testing positive, patient did not followup after these workup. His increase COPD exacerbations were initially thought to be due to mold in his house, he has since moved and is still having same amount of exacerbations. Patient states that he has COPD exacerbations/bronchitis about 4-5 times per year, each time  requiring antibiotics and steroids. Last year he had about 5 physician visits for "bronchitis" each time treated with steroids and antibiotics. He is currently still smoking about 1.5 packs per day, he was down to less than half pack a day however,  resumed smoking heavily again late in 2015. Patient states he has a chronic morning cough with mild to thick whitish sputum. COPD exacerbation symptoms-increase sputum production, sputum becomes mild yellow to green, increased shortness of breath. The patient endorses mild dyspnea today his MMRC score was 0, CAT score was 24. Patient was recently diagnosed with another acute bronchitis episode and treated with a Z-Pak and steroids, currently he has 2 days of steroids remaining (see review of chart history below). Patient states that he was attending pulmonary rehabilitation however has been having increased knee pain (he has been evaluated by orthopedics for possible surgical intervention) and has not been in rehabilitation for a few weeks due to knee pain and recurrent bronchitis episodes.  Review of chart: Urgent Care Initial visit 11/25/14 This 64 y.o. male presents for cough, cold symptoms. Onset three weeks ago. NO fever/chills/sweats. Slight headache. No ear pain. Slight sore throat; intermittent; no pain with swallowing. Mild rhinorrhea. Nasal congestion. Horrible cough. Sputum white. Wheezing a lot. Using nebulizer four times a day. Using Advair and Spiriva. Smoking 1 ppd. Holidays are bad time. Mother passed 12/3. [facility]; prescribed Prednisone, Levaquin 750mg  daily. Scant improvement. Mucous more discolored. Last nebulizer treatment last night.  Need flu vaccine.  Burning sensation in chest: increased lately. Worried about lung cancer. Requesting low dose CT.  R knee surgery: To schedule knee surgery after Christmas.  Plan: supportive Care   Telephone encounter 12/24/14 - Leslie Sims Pt called--- two days ago, onset of chest congestion and head congestion. No fever/chills/sweats. Mild headache; mild sore throat. +rhinorrhea; +nasal congestion. +cough. +sputum production increased amount and change in color. +worsening DOE. Using Abluterol tid. Compliance with COPD  medications. Wife recovering from aortic aneurysm dissection and pt unable to come to office for evaluation. A/P: URI with COPD exacerbation: New. Levaquin, Prednisone. Increase Albuterol to qid. To office for worsening DOE, wheezing.  Plan: Given Levaquin, prednisone, increase albuterol to QID  Urgent Care Follow up visit 01/02/15 Madix Blowe [facility] is a 64 y.o. male with PMHx of COPD, CAD, ongoing tobacco abuse who [facility] complaining of chest congestion for ten days now. Patient reports improvement of his symptoms since his last visit stating that he "doesn't have as much phlegm." Patient also reports cough, headache, right ear pain, sore throat likened to having something lodged in this throat, and rhinorrhea with light yellow discharge. Patient denies sinus pressure stating that he is using his Netti pot once a day. Patient reports nebulizer treatments 3 times a day. Patient reports taking the Levaquin and Prednisone prescribed at his last visit but he did not use the Carafate due to interaction warnings on the label. Patient denies fever, chills, and sweats. Patient reports fleeting central chest pain and shortness of breath with exertion. Patient describes dull burning sensation in his chest; patient denies pain with exertion and states the pain presents spontaneously. Patient reports that he has an upcoming appointment with pulmonology on Monday, in four days. Patient denies painting, pressure washing, or cleaning dusty area recently. Patient reports that when he is working he wears a mask, but he feels that it "isn't filtering enough." Patient agrees that he will stop smoking and states  he is working on it. Wife is currently recovering from aortic aneurysm dissection. Chest congestion is still significant and different from baseline.  Pt s/p CT lungs which revealed small nodules in left upper and lower lobes; repeat CT recommended in 3-6 months. Pt unable to  attend pulmonary rehab due to bad knee Assessment & Plan:    1.  Cough   2.  Lower respiratory infection   3.  COPD exacerbation   4.  Pulmonary nodules    1. Lower respiratory infection: New. S/p Levaquin and Prednisone. Rx for Zpack provided.  2. COPD exacerbation: new. S/p Prednisone; complete taper; continue Albuterol nebulizer tid. Continue Advair and Spiriva. Unable to attend pulmonary rehab at current time due to knee pain. Has appointment next week with new pulmonologist. Highly recommend smoking cessation to help decrease frequency of exacerbations.  3. Pulmonary nodules: New. Warrants repeat CT in 3-6 months.  PMHX:   Past Medical History  Diagnosis Date  . Hyperlipidemia   . COPD (chronic obstructive pulmonary disease)   . Depression   . GERD (gastroesophageal reflux disease)   . Colon polyps 08/2012    colonoscopy; multiple colon polyps; repeat colonoscopy in one year.  . Status post dilation of esophageal narrowing 2000  . Peptic ulcer   . Hypertension   . CARDIOMYOPATHY 03/24/2010    did not tolerate Toprol and Lisinopril.   . Insomnia   . Hemorrhoids   . MI (myocardial infarction)   . Atherosclerosis   . Anxiety   . Headache(784.0)   . Tinea pedis   . Myalgia   . Helicobacter pylori (H. pylori)   . Thyroid dysfunction   . Chest pain   . Back pain   . Hernia     inguinal  . Allergic rhinitis   . Bronchitis     09-14-11  . Chronic airway obstruction, not elsewhere classified   . Coronary artery disease 11/2009    Anterior MI with late presentation 11/2009. Cath showed a 100% proximal LAD occlusion, 99% mid RCA. Had a PCI and 2 DES to both LAD and RCA. EF was 35%. Nuclear stress test 05/13: prior anterior/inferior infarcts without ischemic, EF 43%, cardiac cath in 02/14: Widely patent stents with no other obstructive disease. EF 40%    Surgical Hx:  Past Surgical History  Procedure Laterality Date  . Penile prosthesis implant    . Esophagogastroduodenoscopy   2008  . Colonoscopy    . Esophageal dilation    . Hernia repair  07/20/2012    L inguinal hernia repair  . Admission  12/20/2012    COPD exacerbation.  [facility].  . Esophagogastroduodenoscopy  08/20/2012  . Cardiac catheterization    . Coronary angioplasty with stent placement  09/2009    LAD 3.0 X23 mm Xience DES, RCA: 4.0 X 15 mm Xience DES  . Cardiac catheterization  02/05/13    [facility]  . Cardiac catheterization  10/14    [facility] : patent stents with no change in anatomy. EF: 40$   Family Hx:  Family History  Problem Relation Age of Onset  . Heart attack Brother     Brother #1  . Diabetes Brother     brother #2  . Hypertension Brother     #3  . Coronary artery disease Father 87    deceased  . Heart attack Father   . Diabetes Father   . Heart disease Father   . COPD Mother 54    deceased  . Heart attack  Sister 40    acute-cocaine abuse/deceased  . Alcohol abuse Sister     polysubstance abuse  . COPD Sister   . Alcohol abuse Sister     polysubstance abuse  . Heart disease Brother     AMI x multiple   Social Hx:   History  Substance Use Topics  . Smoking status: Current Every Day Smoker -- 2.00 packs/day for 42 years    Types: Cigarettes  . Smokeless tobacco: Never Used  . Alcohol Use: No   Medication:   Current Outpatient Rx  Name  Route  Sig  Dispense  Refill  . albuterol (PROVENTIL) (2.5 MG/3ML) 0.083% nebulizer solution      ONE VIAL VIA NEBULIZER EVERY 6 HOURS AS NEEDED FOR WHEEZING OR SHORTNESS OF BREATH   225 mL   4   . albuterol (VENTOLIN HFA) 108 (90 BASE) MCG/ACT inhaler   Inhalation   Inhale 2 puffs into the lungs every 6 (six) hours as needed. For wheezing   54 g   11   . aspirin 81 MG tablet   Oral   Take 81 mg by mouth daily.           [name] atorvastatin (LIPITOR) 20 MG tablet      TAKE 1 TABLET DAILY   90 tablet   3   . azithromycin (ZITHROMAX) 250 MG tablet      Two tablets daily x 1 day, then one tablet daily x 4 days   6 tablet   0   .  chlorpheniramine-HYDROcodone (TUSSIONEX) 10-8 MG/5ML LQCR   Oral   Take 5 mLs by mouth every 12 (twelve) hours as needed for cough.   180 mL   0   . clopidogrel (PLAVIX) 75 MG tablet   Oral   Take 1 tablet (75 mg total) by mouth daily.   7 tablet   1   . esomeprazole (NEXIUM) 40 MG capsule   Oral   Take 40 mg by mouth daily at 12 noon.         . Fluticasone-Salmeterol (ADVAIR DISKUS) 500-50 MCG/DOSE AEPB   Inhalation   Inhale 2 puffs into the lungs daily.   3 each   3   . LORazepam (ATIVAN) 0.5 MG tablet   Oral   Take 1 tablet (0.5 mg total) by mouth as needed.   30 tablet   3   . losartan (COZAAR) 25 MG tablet   Oral   Take 1 tablet (25 mg total) by mouth daily.   7 tablet   1   . nitroGLYCERIN (NITROSTAT) 0.4 MG SL tablet   Sublingual   Place 1 tablet (0.4 mg total) under the tongue every 5 (five) minutes as needed.   25 tablet   3   . sertraline (ZOLOFT) 100 MG tablet   Oral   Take 2 tablets (200 mg total) by mouth daily.   60 tablet   11   . tiotropium (SPIRIVA) 18 MCG inhalation capsule   Inhalation   Place 1 capsule (18 mcg total) into inhaler and inhale daily.   90 capsule   3       Allergies:  Wellbutrin  Review of Systems: Gen:  Denies  fever, sweats, chills HEENT: Denies blurred vision, double vision, ear pain, eye pain, hearing loss, nose bleeds, sore throat Cvc:  No dizziness, chest pain or heaviness Resp:   Cough (improving), moderate sputum production (thin to thick white) Gi: Denies swallowing difficulty, stomach pain, nausea or  vomiting, diarrhea, constipation, bowel incontinence Gu:  Denies bladder incontinence, burning urine Ext:   No Joint pain, stiffness or swelling Skin: No skin rash, easy bruising or bleeding or hives Endoc:  No polyuria, polydipsia , polyphagia or weight change Psych: No depression, insomnia or hallucinations  Other:  All other systems negative  Physical Examination:   VS: BP 124/62 mmHg  Pulse 78   Temp(Src) 97.7 F (36.5 C) (Oral)  Ht 5\' 9"  (1.753 m)  Wt 159 lb (72.122 kg)  BMI 23.47 kg/m2  SpO2 94%  General Appearance: No distress  Neuro:without focal findings, mental status, speech normal, alert and oriented, cranial nerves 2-12 intact, reflexes normal and symmetric, sensation grossly normal  HEENT: PERRLA, EOM intact, no ptosis, no other lesions noticed; Mallampati 1 Pulmonary: coarse upper airway sounds, faint BS at the bases, prolong expiratory phase (about 3-4 secs), cough, no wheeze.  Sputum production - moderate thick white CardiovascularNormal S1,S2.  No m/r/g.  Abdominal aorta pulsation normal.    Abdomen: Benign, Soft, non-tender, No masses, hepatosplenomegaly, No lymphadenopathy Renal:  No costovertebral tenderness  GU:  No performed at this time. Endoc: No evident thyromegaly, no signs of acromegaly or Cushing features Skin:   warm, no rashes, no ecchymosis  Extremities: normal, no cyanosis, clubbing, no edema, warm with normal capillary refill. Other findings:none     Rad results: (The following images and results were reviewed by Leslie. Stevenson Clinch). CT Chest 12/18/14 CT CHEST WITHOUT CONTRAST  TECHNIQUE: Multidetector CT imaging of the chest was performed following the standard protocol without IV contrast.  COMPARISON: Chest x-ray dated 09/30/2014  FINDINGS: There is a 7 mm nodule in the superior segment of the right lower lobe with an adjacent 5 mm nodule slightly more medial. These are seen on images 23 and 25 of series 4. There is also a 4 mm nodule at the left lung apex on image 13 of series 4.  There is diffuse emphysema in both lungs. There are some calcified vessels at the right lung base, probably representing calcification of chronically thrombosed arteries.  Heart size and pulmonary vascularity are normal. Coronary artery stent is noted. No osseous abnormality. The visualized portion of the upper abdomen demonstrates calcified granulomas in  the spleen and a 2.8 cm adenoma on the left adrenal gland. There is a 2.3 cm adenoma on the right adrenal gland.  IMPRESSION: 1. Extensive emphysematous changes throughout both lungs. 2. Small indeterminate nodules in the left upper and lower lobes. If the patient is at high risk for bronchogenic carcinoma, follow-up chest CT at 3-6 months is recommended. If the patient is at low risk for bronchogenic carcinoma, follow-up chest CT at 6-12 months is recommended. This recommendation follows the consensus statement: Guidelines for Management of Small Pulmonary Nodules Detected on CT Scans: A Statement from the [employer] as published in Radiology 2005; 237:395-400.   CXR 01/02/15 The lungs remain hyperinflated with emphysema. The cardiomediastinal contours are normal. Pulmonary vasculature is normal. The small pulmonary nodules on CT are not well seen. No consolidation, pleural effusion, or pneumothorax. No acute osseous abnormalities are seen. Bullet shaped density in the right axilla up  IMPRESSION: Emphysema and hyperinflation. No superimposed acute pulmonary process.  Assessment and Plan: COPD, very severe Patient with stage IV COPD I believe he does have an allergic component along with tobacco use that is currently making his COPD exacerbations more frequent. Currently he is finishing another course of steroid and antibiotic, will plan to resume pulmonary rehabilitation in a  few weeks.  Plan:  -educated again today to quit smoking -cont with pulmonary rehabilitation -Continue Spiriva, Advair, when necessary albuterol -check IgE level prior to next visit - may consider xolair and further testing for ABPA (this was initiated in 2014 but patient switched to [facility]) at next visit. -plan for PFTs and a 6 minute walk test prior to next visit -Will discuss restarting Daliresp at next visit depending on his clinical status. -MMRC=0, CAT=24   Seasonal and  perennial allergic rhinitis Review of chart showed the following: Allergy Profile 05/18/2013-total IgE 664.8 with elevated specific antigens for almost everything on the panel.  Wears a mask to work now. 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  Patient stated he has moved since 2014, but has not noticed any improvement in the number of COPD exacerbations he is having.  Plan: -Check IgE levels -May consider reinitiating/continuing ABPA workup.  Will continue optimizing COPD treatment and reevaluate for ABPA at next visit if symptoms are not improving.   SMOKER Tobacco Cessation - Counseling regarding benefits of smoking cessation strategies was provided for more than 12 min. - Educated that at this time smoking- cessation represents the single most important step that patient can take to enhance the length and quality of live. - Educated patient regarding alternatives of behavior interventions, pharmacotherapy including NRT and non-nicotine therapy such, and combinations of both. - Patient at this time:  will try to quit on his own.   Cough Certainly due to COPD and chronic bronchitis, but I think that allergic component is also contributing.  Plan: -continue flonase  -continue loratadine -continue saline rinses       Updated Medication List Outpatient Encounter Prescriptions as of 01/06/2015  Medication Sig  . albuterol (PROVENTIL) (2.5 MG/3ML) 0.083% nebulizer solution ONE VIAL VIA NEBULIZER EVERY 6 HOURS AS NEEDED FOR WHEEZING OR SHORTNESS OF BREATH  . albuterol (VENTOLIN HFA) 108 (90 BASE) MCG/ACT inhaler Inhale 2 puffs into the lungs every 6 (six) hours as needed. For wheezing  . aspirin 81 MG tablet Take 81 mg by mouth daily.    [name] atorvastatin (LIPITOR) 20 MG tablet TAKE 1 TABLET DAILY  . azithromycin (ZITHROMAX) 250 MG tablet Two tablets daily x 1 day, then one tablet daily x 4 days  . chlorpheniramine-HYDROcodone (TUSSIONEX) 10-8 MG/5ML LQCR Take 5 mLs by mouth  every 12 (twelve) hours as needed for cough.  . clopidogrel (PLAVIX) 75 MG tablet Take 1 tablet (75 mg total) by mouth daily.  [name] esomeprazole (NEXIUM) 40 MG capsule Take 40 mg by mouth daily at 12 noon.  . Fluticasone-Salmeterol (ADVAIR DISKUS) 500-50 MCG/DOSE AEPB Inhale 2 puffs into the lungs daily.  [name] LORazepam (ATIVAN) 0.5 MG tablet Take 1 tablet (0.5 mg total) by mouth as needed.  [name] losartan (COZAAR) 25 MG tablet Take 1 tablet (25 mg total) by mouth daily.  . nitroGLYCERIN (NITROSTAT) 0.4 MG SL tablet Place 1 tablet (0.4 mg total) under the tongue every 5 (five) minutes as needed.  . sertraline (ZOLOFT) 100 MG tablet Take 2 tablets (200 mg total) by mouth daily.  [name] tiotropium (SPIRIVA) 18 MCG inhalation capsule Place 1 capsule (18 mcg total) into inhaler and inhale daily.  . [DISCONTINUED] CARAFATE 1 GM/10ML suspension TAKE 10ML( 2 TEASPOONSFUL) BY MOUTH FOUR TIMES DAILY (Patient not taking: Reported on 01/02/2015)    Orders for this visit: No orders of the defined types were placed in this encounter.     Thank  you for the consultation and  for allowing [facility] to assist in the care of your patient. Our recommendations are noted above.  Please contact us if we can be of further service.   Vilinda Boehringer, MD [facility] Office Number: [phone]

## 2015-01-08 ENCOUNTER — Other Ambulatory Visit: Payer: Self-pay | Admitting: Family Medicine

## 2015-01-09 NOTE — Telephone Encounter (Signed)
Dr Tamala Julian, I see that you often Rx pred for this pt. Do you want to give RF? Pt saw pulmonologist since he was here.

## 2015-01-21 ENCOUNTER — Other Ambulatory Visit: Payer: Self-pay

## 2015-01-21 MED ORDER — SUCRALFATE 1 GM/10ML PO SUSP: ORAL | Status: DC

## 2015-01-21 NOTE — Telephone Encounter (Addendum)
[facility] Script[facility] reqs Rf of Carafate. I have pended the last Rx on file for it. Dr Tamala Julian, you just saw pt in Jan but dont see GERD discussed since 06/17/14 OV. Do you want to give RFs? I did not know the quantity you would want to Rx for 90 days (quantity figured is for pt taking QID every day, but don't know if you want pt taking med on a regular basis)?

## 2015-01-22 ENCOUNTER — Other Ambulatory Visit: Payer: Self-pay | Admitting: Family Medicine

## 2015-01-23 NOTE — Telephone Encounter (Signed)
Please call in or fax refill to local pharmacy.

## 2015-01-24 NOTE — Telephone Encounter (Signed)
Faxed

## 2015-01-31 ENCOUNTER — Other Ambulatory Visit: Payer: Self-pay

## 2015-01-31 DIAGNOSIS — I5022 Chronic systolic (congestive) heart failure: Secondary | ICD-10-CM

## 2015-01-31 DIAGNOSIS — R0789 Other chest pain: Secondary | ICD-10-CM

## 2015-01-31 MED ORDER — CLOPIDOGREL BISULFATE 75 MG PO TABS: 75.0000 mg | ORAL_TABLET | Freq: Every day | ORAL | Status: DC

## 2015-01-31 MED ORDER — LOSARTAN POTASSIUM 25 MG PO TABS: 25.0000 mg | ORAL_TABLET | Freq: Every day | ORAL | Status: DC

## 2015-02-19 ENCOUNTER — Telehealth: Payer: Self-pay | Admitting: Family Medicine

## 2015-02-19 MED ORDER — DOXYCYCLINE HYCLATE 100 MG PO CAPS: 100.0000 mg | ORAL_CAPSULE | Freq: Two times a day (BID) | ORAL | Status: DC

## 2015-02-19 NOTE — Telephone Encounter (Signed)
Patient called reporting chest congestion for one week since exposure to paint fumes.  No fever/chillss/sweats. No headache, ear pain, sore throat.  +rhinorrhea; +nasal congestion. +cough; +sputum production; +wheezing; no SOB.  A/P:  COPD exacerbation secondary to chemical exposure:  New.  Rx for Doxy sent to pharmacy. To start Prednisone.

## 2015-02-24 ENCOUNTER — Ambulatory Visit: Payer: BC Managed Care – PPO | Admitting: Family Medicine

## 2015-03-13 ENCOUNTER — Other Ambulatory Visit: Payer: Self-pay | Admitting: *Deleted

## 2015-03-13 ENCOUNTER — Telehealth: Payer: Self-pay | Admitting: Cardiovascular Disease

## 2015-03-13 DIAGNOSIS — I10 Essential (primary) hypertension: Secondary | ICD-10-CM

## 2015-03-13 DIAGNOSIS — R0789 Other chest pain: Secondary | ICD-10-CM

## 2015-03-13 DIAGNOSIS — I2510 Atherosclerotic heart disease of native coronary artery without angina pectoris: Secondary | ICD-10-CM

## 2015-03-13 DIAGNOSIS — I5022 Chronic systolic (congestive) heart failure: Secondary | ICD-10-CM

## 2015-03-13 MED ORDER — NITROGLYCERIN 0.4 MG SL SUBL: 0.4000 mg | SUBLINGUAL_TABLET | SUBLINGUAL | Status: DC | PRN

## 2015-03-13 NOTE — Telephone Encounter (Signed)
Patient needs refill of Nitroglycerin.  Patient is aware that this may be sent on Monday.

## 2015-03-13 NOTE — Telephone Encounter (Signed)
Patient goes to [facility]

## 2015-03-13 NOTE — Telephone Encounter (Signed)
ERROR

## 2015-03-28 ENCOUNTER — Telehealth: Payer: Self-pay | Admitting: Family Medicine

## 2015-03-28 MED ORDER — AZITHROMYCIN 250 MG PO TABS: ORAL_TABLET | ORAL | Status: DC

## 2015-03-28 MED ORDER — PREDNISONE 20 MG PO TABS: ORAL_TABLET | ORAL | Status: DC

## 2015-03-28 NOTE — Telephone Encounter (Signed)
Patient called requesting Prednisone taper and antibiotic due to allergies and congestion for several weeks.  Denies fever/chills/sweats.  Denies severe DOE.  +cough; +wheezing; +worsening dyspnea.   A/P Allergic rhinitis and COPD: Prednisone taper sent in.

## 2015-04-08 NOTE — Op Note (Signed)
PATIENT NAME:  Leslie Sims, DAFT MR#:  [id number] DATE OF BIRTH:  07-Jul-1951  DATE OF PROCEDURE:  09/06/2012

## 2015-04-11 NOTE — Consult Note (Signed)
PATIENT NAME:  Leslie Sims, Leslie Sims MR#:  [id number] DATE OF BIRTH:  1951/03/27  DATE OF CONSULTATION:  01/13/2013  REFERRING PHYSICIAN:  Dr. Manuella Ghazi.  CONSULTING PHYSICIAN:  Allyne Gee, MD  HISTORY OF PRESENT ILLNESS:  The patient is a 64 year old gentleman who has past medical history significant for COPD and coronary disease who comes into the hospital because of having increasing shortness of breath.  He is unfortunately still a smoker and continues to smoke.  He apparently had a nicotine patch on and had a couple of electronic cigarettes sitting on his table at the bedside.  The patient said that he has been having some cough and congestion, bringing up some phlegm.  He was seen as an outpatient by his primary care physician and was prescribed antibiotics as well as steroids.  He finished the antibiotics and was tapering on his steroid, but did not really show much in the way of improvement.  The patient in the Emergency Room was given IV steroids as well as aggressive nebulizer treatment.   PAST MEDICAL HISTORY:  Significant for coronary disease, hyperlipidemia, depression and COPD.  I do not believe he is on oxygen at this time.   PAST SURGICAL HISTORY:  He has had hernias in the past.   SOCIAL HISTORY:  Positive for tobacco use, greater than a pack, almost a pack and a half a day.  No alcohol abuse.  No drug use.   FAMILY HISTORY:  Significant for cardiac disease as well as COPD.    REVIEW OF SYSTEMS:  A complete 12 point system review was performed and is unremarkable other than what is noted up in the history of present illness referable to the pulmonary system.    PHYSICAL EXAMINATION: VITAL SIGNS:  Temp was 97.3, pulse 87, respiratory rate 20, blood pressure 162/80, saturations were 92% and that was on 1 liter of oxygen.  NECK:  Appeared to be supple.  There was no JVD.  No lymphadenopathy.  No thyromegaly.  HEENT:  Extraocular movements were grossly intact.  ABDOMEN:  Appeared to be  soft and nontender.  CHEST:  Showed good air entry bilaterally.  There were some distant rhonchi.  No rales.  Expansion appeared to be equal although the breath sounds were somewhat diminished.  EXTREMITIES:  Without cyanosis, clubbing. Pulses equal. No edema noted.  NEUROLOGIC:  Awake and alert, moving all four extremities.  Gait was not checked.  SKIN:  Without any acute rashes.  MUSCULOSKELETAL:  Without any active synovitis.   LABORATORY DATA:  He had an ABG on admission that was 74, 12, 42, 55 and that was on room air.  The patient's other labs that were done included a white count 9.7, hemoglobin 15, hematocrit is 43.4.  The patient did have a chest x-ray done which was reviewed by me.  Did not show any acute infiltrates and it was consistent with COPD.   IMPRESSION: 1.  Acute on chronic respiratory failure with hypoxemia.  2.  Chronic obstructive pulmonary disease with exacerbation.   PLAN:  Currently he is on high-dose steroids which will be continued.  He is on antibiotics which will be continued.  I reviewed his inhalers and he will be continued on current inhaler therapy.  As far as nicotine abuse is concerned, I educated him on not using the nicotine patch and also not to use the electronic cigarette concomitant with.  I urged him to stop smoking as this will significantly help with his respiratory status.  We will continue to follow along while he is in the hospital.       ____________________________ Allyne Gee, MD sak:ea D: 01/13/2013 19:54:00 ET T: 01/14/2013 06:41:14 ET JOB#: [id number]  cc: Allyne Gee, MD, <Dictator> Roselie Awkward, MD [facility]) Allyne Gee MD ELECTRONICALLY SIGNED 02/09/2013 15:59

## 2015-04-11 NOTE — H&P (Signed)
PATIENT NAME:  Leslie Sims, Leslie Sims MR#:  [id number] DATE OF BIRTH:  1951-09-21  DATE OF ADMISSION:  01/10/2013  PRIMARY CARE PHYSICIAN: [name], MD  PULMONOLOGIST: Roselie Awkward, MD ([facility])  CARDIOLOGIST: Ida Rogue, MD ([facility])  REFERRING ER PHYSICIAN: Marjean Donna, MD  CHIEF COMPLAINT: Shortness of breath.   HISTORY OF PRESENT ILLNESS: This is a 64 year old male with past medical history of COPD, coronary artery disease status post stent in the past, hyperlipidemia and current smoker who started feeling short of breath 8 to 9 days ago and he called his doctor. He prescribed him oral steroids and antibiotics. He finished the course of antibiotics in the last 6 days and he is on tapering dose of steroid, that he still has to take for 2 more days, but his complaint did not resolve. He continued feeling very short of breath and coughing up greenish sputum. He denies any fever or weakness or chest pain. He denies any sick contact. Finally he decided to come to the Emergency Room. Today, in the ER, they gave him multiple nebulizers and oxygen supplementation, but he did not recover and that is why they gave him to [place] for admission.  REVIEW OF SYSTEMS:  CONSTITUTIONAL: Negative for fever, fatigue, weakness, pain or weight loss.   EYES: No blurring or double lesion. No redness or discharge.   ENT: No tinnitus, ear pain, hearing loss or any discharge.   RESPIRATORY: He has cough, wheezing and greenish sputum.   CARDIOVASCULAR: Denies any chest pain, orthopnea, edema, arrhythmia or palpitations.   GASTROINTESTINAL: Denies any nausea, vomiting, diarrhea or abdominal pain.   GENITOURINARY: Denies dysuria, hematuria or increased frequency.   HEMATOLOGY: Denies anemia, easy bruising, bleeding or swollen glands.   MUSCULOSKELETAL: Denies any pain or swelling in the joints.   NEUROLOGICAL: Denies any numbness, weakness, dysarthria or tremors.   PSYCHIATRIC: Denies any  anxiety, insomnia.  PAST MEDICAL HISTORY:  1. Chronic obstructive pulmonary disease. 2. Coronary artery disease status post stent. 3. Hyperlipidemia. 4. Depression.   PAST SURGICAL HISTORY: Hernia surgery.   SOCIAL HISTORY: He is a current smoker, smokes 1-1/2 packs a day. He denies drinking alcohol. No use of illegal drugs.   FAMILY HISTORY: Positive for heart disease in brother and COPD in mother.  PHYSICAL EXAMINATION:  VITAL SIGNS: Temperature 98.5, pulse rate 108, respirations 24, blood pressure 151/87 and 90% pulse oximetry on room air.   GENERAL: He is fully alert and oriented to time, place, and person, in mild respiratory distress due to shortness of breath, has to use nasal cannula oxygen.   HEENT: Head and neck atraumatic. Conjunctivae pink. Oral mucosa moist.   RESPIRATORY: Bilateral equal air entry. Extensive expiratory wheezing heard bilaterally. No crepitation.   CARDIOVASCULAR: S1 and S2 present, regular. No murmur. No JVD. No lower extremity edema.   ABDOMEN: Soft, nontender. Bowel sounds present. No organomegaly appreciated.   SKIN: No rashes.   NEUROLOGICAL: Power five out of five in all four limbs. No tremor. Follows commands.   PSYCHIATRIC: Does not appear in any acute psychiatric illness at this point of time.  LABS/RADIOLOGIC STUDIES: Glucose 100, BUN 8, creatinine 0.71, sodium 140, potassium 3.1, chloride 106, CO2 27, calcium 8.5, total protein 6.7, albumin 3.6, bilirubin 0.3, alkaline phosphate 99, SGOT 19 and SGPT 22. Troponin less than 0.02. WBC 7.5, hemoglobin 14.3, platelet count 258 and MCV 87.   ABG: pH 7.44, pCO2 42 and pO2 55 on 21% FiO2 oxygen supplementation.   Chest x-ray:  Not officially reported but done. It shows right middle lobe increased markings, possibly infective to me. Follow official report.   ASSESSMENT AND PLAN: A 64 year old male with COPD and current smoker feeling short of breath for the last few days and was on outpatient  therapy for COPD exacerbation with oral steroids and antibiotics, but did not help after finishing 5-days course of antibiotic and still on tapering dose of oral steroids.  1. Acute hypoxic respiratory failure secondary to COPD exacerbation and pneumonia. He is using nasal cannula oxygen currently and oxygen saturation is more than 90% with 2 liters supplementation. We will treat the underlying cause.  2. Chronic obstructive pulmonary disease exacerbation. IV Solu-Medrol and IV Rocephin plus Zithromax, DuoNebs, Spiriva and Pulmicort.  3. Pneumonia. He failed outside oral antibiotic treatment, finished 5-days course, no help, has greenish sputum. IV Rocephin and Zithromax. Sputum culture.  4. Current smoker. Counseled him about smoking cessation. He agrees for now to stop smoking. He has asked for nicotine patch. We will provide it. Total time spent in counseling, 5 minutes.   5. Coronary artery disease status post stent. Currently does not appear in any acute distress because of this problem, stable issue. We will continue his home medication as he was taking before. 6. Hypokalemia. We will replace and recheck in the morning.  7. Depression. We will continue his fluoxetine for this point. 8. GI prophylaxis. We will continue his Nexium and sucralfate as he was taking at home.  CODE STATUS: FULL CODE. Healthcare proxy is his wife.  TOTAL TIME SPENT: 55 minutes.  ____________________________ Ceasar Lund Anselm Jungling, MD vgv:sb D: 01/10/2013 08:30:04 ET T: 01/10/2013 08:48:52 ET JOB#: [id number]  cc: Ceasar Lund. Anselm Jungling, MD, <Dictator> Vaughan Basta MD ELECTRONICALLY SIGNED 02/09/2013 17:51

## 2015-04-11 NOTE — Discharge Summary (Signed)
PATIENT NAME:  Leslie Sims, Leslie Sims MR#:  [id number] DATE OF BIRTH:  [birth year]  DATE OF ADMISSION:  01/10/2013 DATE OF DISCHARGE:  01/14/2013  PRIMARY CARE PROVIDER:  Maurine Minister, NP  CONSULTANT:  Pulmonary, Allyne Gee, MD  DISCHARGE DIAGNOSES:   1.  Chronic obstructive pulmonary disease exacerbation.  2.  Pneumonia.  3.  Smoker.  4.  Coronary artery disease.  5.  Depression.   HISTORY OF PRESENT ILLNESS:  This is a 64 year old male with past medical history of COPD, coronary artery disease status post stent in the past, hyperlipidemia, and current smoker who started feeling short of breath 8 to 9 days ago before presentation. He called his doctor. He prescribed him oral steroids and antibiotics. He finished the course of antibiotics in the last 6 days and a tapering dose of steroids also, but still his complaints did not resolve. He continued feeling very short of breath and coughing up greenish sputum. Denied any fever, weakness or chest pain. Finally, he decided to come to the Emergency Room. The ER physician gave him multiple nebulizers and oxygen supplementation, but he did not recover and so he was admitted to hospital for further management as an inpatient.   HOSPITAL COURSE AND STAY:   1.  Acute hypoxic respiratory failure secondary to COPD exacerbation and pneumonia. He was admitted with this diagnosis. Usually, he was not needing any oxygen at home, but in the hospital, he was needing 2 to 3 liters nasal cannula oxygen supplementation to maintain his saturation up to 90%. We treated the underlying cause of COPD and pneumonia.  2.  Chronic obstructive pulmonary disease exacerbation. We treated it with IV Solu-Medrol, IV Rocephin and IV Zithromax with DuoNebs, Spiriva and Pulmicort. He had a slow, but gradual improvement in his condition. Initially for 3 days, he was feeling improvement, but he still was using 2 liters nasal cannula oxygen supplementation and due to his very slow  improvement in symptoms, we called a pulmonary consult with Dr. Devona Konig. He saw the patient and he suggested to continue the current management and after that on the fourth and fifth day, the patient felt better. He tolerated room air on exertion and so we will discharge him without any oxygen at home.  3.  For pneumonia, he failed outpatient treatment with a 5-day antibiotic course and tapering steroids so we started him on IV Rocephin and IV Zithromax and he felt significantly better after 5 days of inpatient treatment. We discharged him on oral antibiotics for the next few days.  4.  Smoker. He was still smoking so I did the smoking cessation counseling and he agreed to stop smoking. We provided him a nicotine patch while in the hospital and on discharge.  5.  Coronary artery disease status post stent. He did not have any acute distress, chest pain or any symptoms due to this. We continued his home medication while he was in the hospital.  6.  Hypokalemia. On presentation, his potassium was low. We replaced and it remained normal afterwards.  7.  Depression. We continued his fluoxetine while in the hospital.   DIAGNOSTIC DATA:  CK total 41. On admission, BUN was 8, creatinine 0.71, sodium 140, potassium 3.1, chloride 106, CO2 was 27, bilirubin 0.3, alkaline phosphate 99, SGPT 22, SGOT 19. WBC was 7.5, RBC 4.82, hemoglobin 14.3, platelet count 258 and MCV 87. Troponin on admission was less than 0.02. X-ray on admission: Mild right infrahilar opacity may be secondary to atelectasis in  technique; however, PA and lateral chest radiograph is recommended. ABG on admission: pH was 7.44, pCO2 was 42 and pO2 was 55 on room air. Blood culture on admission remained negative. Urinalysis on admission was grossly negative. Influenza A and B antigens on admission were negative. Legionella urinary antigen was negative. His potassium on followup increased to 4.4.   CONDITION ON DISCHARGE:  Stable.   CODE STATUS:   Full code.   MEDICATIONS ADVISED ON DISCHARGE:  Hydrocodone/_____ 10/5 mg 2 times a day as needed, Nexium 40 mg oral delayed-release capsule once a day, clopidogrel 75 mg oral tablet once a day, aspirin 81 mg oral tablet once a day, sucralfate 1 gram oral tablet 4 times a day as needed, atorvastatin 20 mg oral tablet once a day, lorazepam 0.5 mg oral tablet once a day at bedtime, sertraline 50 mg oral tablet 3 times a day as needed, Ventolin HFA 2 puffs inhaled 1 to 4 times a day as needed, loratadine 10 mg oral tablet once a day, Nitrostat 0.4 mg orally as needed for chest pain, prednisone 10 mg oral tablet start with 60 mg and taper by 10 mg daily until complete, Tiotropium 18 mcg inhaled capsule once a day, _____ 500 mg oral tablet 2 times a day for 4 days, budesonide 90 mcg inhalation powder 1 puff 2 times a day, nicotine patch 21 mg extended release once a day for 14 days.   HOME HEALTH:  No.   HOME OXYGEN ON DISCHARGE:  No.   DIET ON DISCHARGE:  Regular.   DIET CONSISTENCY ON DISCHARGE:  Regular.   ACTIVITY LIMITATION:  As tolerated.   TIMEFRAME TO FOLLOWUP:  In 1 to 2 weeks with Dr. Juanito Doom at [facility] in pulmonology and Maurine Minister, NP, primary care provider.   TOTAL TIME SPENT ON THIS DISCHARGE:  45 minutes.    ____________________________ Ceasar Lund Anselm Jungling, MD vgv:si D: 01/18/2013 15:59:00 ET T: 01/18/2013 17:47:53 ET JOB#: [id number]  cc: Ceasar Lund. Anselm Jungling, MD, <Dictator> Meredith Leeds, NP Allyne Gee, MD Juanito Doom, MD   Vaughan Basta MD ELECTRONICALLY SIGNED 02/09/2013 17:51

## 2015-05-06 ENCOUNTER — Ambulatory Visit (INDEPENDENT_AMBULATORY_CARE_PROVIDER_SITE_OTHER): Payer: BLUE CROSS/BLUE SHIELD | Admitting: Cardiovascular Disease

## 2015-05-06 ENCOUNTER — Encounter: Payer: Self-pay | Admitting: Cardiovascular Disease

## 2015-05-06 VITALS — BP 112/68 | HR 78 | Ht 70.0 in | Wt 156.0 lb

## 2015-05-06 DIAGNOSIS — I2510 Atherosclerotic heart disease of native coronary artery without angina pectoris: Secondary | ICD-10-CM

## 2015-05-06 DIAGNOSIS — R0602 Shortness of breath: Secondary | ICD-10-CM

## 2015-05-06 DIAGNOSIS — F17200 Nicotine dependence, unspecified, uncomplicated: Secondary | ICD-10-CM

## 2015-05-06 DIAGNOSIS — I10 Essential (primary) hypertension: Secondary | ICD-10-CM

## 2015-05-06 DIAGNOSIS — E785 Hyperlipidemia, unspecified: Secondary | ICD-10-CM

## 2015-05-06 DIAGNOSIS — I5022 Chronic systolic (congestive) heart failure: Secondary | ICD-10-CM | POA: Diagnosis not present

## 2015-05-06 DIAGNOSIS — Z720 Tobacco use: Secondary | ICD-10-CM

## 2015-05-06 NOTE — Assessment & Plan Note (Signed)
He appears to be euvolemic without diuretics. Continue small dose losartan. He did not tolerate treatment with a beta blocker. Most recent ejection fraction was 40%.

## 2015-05-06 NOTE — Assessment & Plan Note (Signed)
I again discussed the importance of smoking cessation materials not ready to quit.

## 2015-05-06 NOTE — Assessment & Plan Note (Signed)
He is currently on atorvastatin 20 mg daily. He is due for a fasting lipid and liver profile which can be done with his upcoming annual physical exam with Dr. Tamala Julian. I recommend a target LDL below 70. His LDL last year was higher than that and if it continues to be the same this year, I recommend increasing the dose of atorvastatin.

## 2015-05-06 NOTE — Patient Instructions (Signed)
Medication Instructions: No changes.   Labwork: None.   Procedures/Testing: None.   Follow-Up: 6 month with Dr. Fletcher Anon.   Any Additional Special Instructions Will Be Listed Below (If Applicable).

## 2015-05-06 NOTE — Assessment & Plan Note (Signed)
He is doing well overall with no clear symptoms of angina. I recommend continuing medical therapy.

## 2015-05-06 NOTE — Progress Notes (Signed)
Primary care physician: Dr. [name]  HPI  This is a 64 year old male who is here today for a followup visit. He has known history of coronary artery disease status post anterior myocardial infarction in December of 2010. His LAD was occluded and RCA had a 99% stenosis. He had an angioplasty and drug-eluting stent placement to both LAD and RCA at that time. 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 Cardiac catheterization in 02/14  showed widely patent stents in the LAD and RCA with no evidence of other obstructive disease. Ejection fraction was 40% with mildly elevated left ventricular end-diastolic pressure. He has been doing reasonably well and denies any chest pain. No worsening dyspnea.  Unfortunately, he continues to smoke. He has been unable to quit smoking in spite of trying different methods and medications. He was noted to have carotid bruit. Carotid Doppler in November showed only mild bilateral carotid stenosis.   Allergies  Allergen Reactions  . Wellbutrin [Bupropion] Other (See Comments)    Unknown/"made me feel real funny"     Current Outpatient Prescriptions on File Prior to Visit  Medication Sig Dispense Refill  . albuterol (PROVENTIL) (2.5 MG/3ML) 0.083% nebulizer solution ONE VIAL VIA NEBULIZER EVERY 6 HOURS AS NEEDED FOR WHEEZING OR SHORTNESS OF BREATH 225 mL 4  . albuterol (VENTOLIN HFA) 108 (90 BASE) MCG/ACT inhaler Inhale 2 puffs into the lungs every 6 (six) hours as needed. For wheezing 54 g 11  . aspirin 81 MG tablet Take 81 mg by mouth daily.      [name] atorvastatin (LIPITOR) 20 MG tablet TAKE 1 TABLET DAILY 90 tablet 3  . chlorpheniramine-HYDROcodone (TUSSIONEX) 10-8 MG/5ML LQCR Take 5 mLs by mouth every 12 (twelve) hours as needed for cough. 180 mL 0  . clopidogrel (PLAVIX) 75 MG tablet Take 1 tablet (75 mg total) by mouth daily. 90 tablet 3  . esomeprazole (NEXIUM) 40 MG capsule Take 40 mg by mouth daily at 12 noon.    .  Fluticasone-Salmeterol (ADVAIR DISKUS) 500-50 MCG/DOSE AEPB Inhale 2 puffs into the lungs daily. 3 each 3  . LORazepam (ATIVAN) 0.5 MG tablet Take 1 tablet (0.5 mg total) by mouth daily as needed for anxiety. 30 tablet 0  . losartan (COZAAR) 25 MG tablet Take 1 tablet (25 mg total) by mouth daily. 90 tablet 3  . nitroGLYCERIN (NITROSTAT) 0.4 MG SL tablet Place 1 tablet (0.4 mg total) under the tongue every 5 (five) minutes as needed. 25 tablet 3  . sucralfate (CARAFATE) 1 GM/10ML suspension TAKE 10ML( 2 TEASPOONSFUL) BY MOUTH FOUR TIMES DAILY 3600 mL 1  . tiotropium (SPIRIVA) 18 MCG inhalation capsule Place 1 capsule (18 mcg total) into inhaler and inhale daily. 90 capsule 3   Current Facility-Administered Medications on File Prior to Visit  Medication Dose Route Frequency Provider Last Rate Last Dose  . albuterol (PROVENTIL) (2.5 MG/3ML) 0.083% nebulizer solution 2.5 mg  2.5 mg Nebulization Once [name], MD      . albuterol (PROVENTIL) (2.5 MG/3ML) 0.083% nebulizer solution 2.5 mg  2.5 mg Nebulization Once [name], MD         Past Medical History  Diagnosis Date  . Hyperlipidemia   . COPD (chronic obstructive pulmonary disease)   . Depression   . GERD (gastroesophageal reflux disease)   . Colon polyps 08/2012    colonoscopy; multiple colon polyps; repeat colonoscopy in one year.  . Status post dilation of esophageal narrowing 2000  .  Peptic ulcer   . Hypertension   . CARDIOMYOPATHY 03/24/2010    did not tolerate Toprol and Lisinopril.   . Insomnia   . Hemorrhoids   . MI (myocardial infarction)   . Atherosclerosis   . Anxiety   . Headache(784.0)   . Tinea pedis   . Myalgia   . Helicobacter pylori (H. pylori)   . Thyroid dysfunction   . Chest pain   . Back pain   . Hernia     inguinal  . Allergic rhinitis   . Bronchitis     09-14-11  . Chronic airway obstruction, not elsewhere classified   . Coronary artery disease 11/2009    Anterior MI with late presentation  11/2009. Cath showed a 100% proximal LAD occlusion, 99% mid RCA. Had a PCI and 2 DES to both LAD and RCA. EF was 35%. Nuclear stress test 05/13: prior anterior/inferior infarcts without ischemic, EF 43%, cardiac cath in 02/14: Widely patent stents with no other obstructive disease. EF 40%      Past Surgical History  Procedure Laterality Date  . Penile prosthesis implant    . Esophagogastroduodenoscopy  2008  . Colonoscopy    . Esophageal dilation    . Hernia repair  07/20/2012    L inguinal hernia repair  . Admission  12/20/2012    COPD exacerbation.  [place].  . Esophagogastroduodenoscopy  08/20/2012  . Cardiac catheterization    . Coronary angioplasty with stent placement  09/2009    LAD 3.0 X23 mm Xience DES, RCA: 4.0 X 15 mm Xience DES  . Cardiac catheterization  02/05/13    [facility]  . Cardiac catheterization  10/14    [facility] : patent stents with no change in anatomy. EF: 40$     Family History  Problem Relation Age of Onset  . Heart attack Brother     Brother #1  . Diabetes Brother     brother #2  . Hypertension Brother     #3  . Coronary artery disease Father 26    deceased  . Heart attack Father   . Diabetes Father   . Heart disease Father   . COPD Mother 64    deceased  . Heart attack Sister 11    acute-cocaine abuse/deceased  . Alcohol abuse Sister     polysubstance abuse  . COPD Sister   . Alcohol abuse Sister     polysubstance abuse  . Heart disease Brother     AMI x multiple     History   Social History  . Marital Status: Married    Spouse Name: N/A  . Number of Children: 5  . 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 Smoking status: Current Every Day Smoker -- 1.50 packs/day for 42 years    Types: Cigarettes  . Smokeless tobacco: Never Used  . Alcohol Use: No  . Drug Use: No  . Sexual Activity: Yes   Other Topics Concern  . Not on file   Social History Narrative   Marital  status: married x 32 years; happily married.      Children: 4 children;  1 stepchild and 15 grandchildren; no great grandchildren.      Lives: with wife; 2 dogs, 1 cat.      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      Tobacco: 1 ppd x 45 years  Alcohol: none; previous alcoholism      Drugs: none      Exercise: no formal exercise; physically demanding job; owns horses.      Seatbelts:  100%      Guns: one loaded unsecured gun in the home.      Sunscreen: none      PHYSICAL EXAM   BP 112/68 mmHg  Pulse 78  Ht 5\' 10"  (1.778 m)  Wt 156 lb (70.761 kg)  BMI 22.38 kg/m2 Constitutional: He is oriented to person, place, and time. He appears well-developed and well-nourished. No distress.  HENT: No nasal discharge.  Head: Normocephalic and atraumatic.  Eyes: Pupils are equal and round. Right eye exhibits no discharge. Left eye exhibits no discharge.  Neck: Normal range of motion. Neck supple. No JVD present. No thyromegaly present. There is a faint left carotid bruit Cardiovascular: Normal rate, regular rhythm, normal heart sounds and. Exam reveals no gallop and no friction rub. No murmur heard.  Pulmonary/Chest: Effort normal and diminished breath sounds. No stridor. No respiratory distress. He has no wheezes. He has no rales. He exhibits no tenderness.  Abdominal: Soft. Bowel sounds are normal. He exhibits no distension. There is no tenderness. There is no rebound and no guarding.  Musculoskeletal: Normal range of motion. He exhibits no edema and no tenderness.  Neurological: He is alert and oriented to person, place, and time. Coordination normal.  Skin: Skin is warm and dry. No rash noted. He is not diaphoretic. No erythema. No pallor.  Psychiatric: He has a normal mood and affect. His behavior is normal. Judgment and thought content normal.      EKG:  Sinus  Rhythm  -Old anterior infarct.   -  Nonspecific T-abnormality.   Rightward axis and rotation  -possible pulmonary disease.   ABNORMAL    ASSESSMENT AND PLAN

## 2015-05-06 NOTE — Assessment & Plan Note (Signed)
Blood pressure is controlled

## 2015-05-08 ENCOUNTER — Other Ambulatory Visit: Payer: Self-pay | Admitting: Cardiovascular Disease

## 2015-05-09 ENCOUNTER — Other Ambulatory Visit: Payer: Self-pay

## 2015-05-09 MED ORDER — ESOMEPRAZOLE MAGNESIUM 40 MG PO CPDR: 40.0000 mg | DELAYED_RELEASE_CAPSULE | Freq: Every day | ORAL | Status: DC

## 2015-05-13 ENCOUNTER — Other Ambulatory Visit: Payer: Self-pay

## 2015-05-13 MED ORDER — ALBUTEROL SULFATE (2.5 MG/3ML) 0.083% IN NEBU: INHALATION_SOLUTION | RESPIRATORY_TRACT | Status: DC

## 2015-05-13 MED ORDER — SERTRALINE HCL 100 MG PO TABS: 100.0000 mg | ORAL_TABLET | Freq: Every day | ORAL | Status: DC

## 2015-06-13 ENCOUNTER — Ambulatory Visit (INDEPENDENT_AMBULATORY_CARE_PROVIDER_SITE_OTHER): Payer: BLUE CROSS/BLUE SHIELD | Admitting: Family Medicine

## 2015-06-13 ENCOUNTER — Encounter: Payer: Self-pay | Admitting: Family Medicine

## 2015-06-13 VITALS — BP 118/71 | HR 70 | Temp 98.0°F | Resp 16 | Ht 68.5 in | Wt 150.8 lb

## 2015-06-13 DIAGNOSIS — I2510 Atherosclerotic heart disease of native coronary artery without angina pectoris: Secondary | ICD-10-CM

## 2015-06-13 DIAGNOSIS — I10 Essential (primary) hypertension: Secondary | ICD-10-CM

## 2015-06-13 DIAGNOSIS — J449 Chronic obstructive pulmonary disease, unspecified: Secondary | ICD-10-CM | POA: Diagnosis not present

## 2015-06-13 DIAGNOSIS — K219 Gastro-esophageal reflux disease without esophagitis: Secondary | ICD-10-CM

## 2015-06-13 DIAGNOSIS — R7302 Impaired glucose tolerance (oral): Secondary | ICD-10-CM

## 2015-06-13 DIAGNOSIS — E785 Hyperlipidemia, unspecified: Secondary | ICD-10-CM | POA: Diagnosis not present

## 2015-06-13 DIAGNOSIS — F341 Dysthymic disorder: Secondary | ICD-10-CM

## 2015-06-13 DIAGNOSIS — Z125 Encounter for screening for malignant neoplasm of prostate: Secondary | ICD-10-CM

## 2015-06-13 DIAGNOSIS — R918 Other nonspecific abnormal finding of lung field: Secondary | ICD-10-CM

## 2015-06-13 MED ORDER — ESOMEPRAZOLE MAGNESIUM 40 MG PO CPDR: 40.0000 mg | DELAYED_RELEASE_CAPSULE | Freq: Two times a day (BID) | ORAL | Status: DC

## 2015-06-13 MED ORDER — VARENICLINE TARTRATE 0.5 MG X 11 & 1 MG X 42 PO MISC: ORAL | Status: DC

## 2015-06-13 MED ORDER — VARENICLINE TARTRATE 1 MG PO TABS: 1.0000 mg | ORAL_TABLET | Freq: Two times a day (BID) | ORAL | Status: DC

## 2015-06-13 MED ORDER — SERTRALINE HCL 100 MG PO TABS: 100.0000 mg | ORAL_TABLET | Freq: Every day | ORAL | Status: DC

## 2015-06-13 NOTE — Progress Notes (Signed)
Subjective:    Patient ID: Leslie Sims, male    DOB: 01-Jan-1951, 64 y.o.   MRN: [id number]  06/13/2015  Follow-up   HPI This 64 y.o. male presents for evaluation of  1.  CAD: s/p evaluation by [facility]; followed every six months.  No chest pain.  2.  COPD:  Last pulmonology follow-up not sure.  No pulmonary rehab right now; last appointment six months ago.  Advair bid, Sprivia once daily.  Rescue inhaler at bedtime.  Nebulizer machine using twice daily.    3.  Hyperlipidemia:  Patient reports good compliance with medication, good tolerance to medication, and good symptom control.   Fasting today.  4.  Glucose Intolerance:  Weight down ten pounds in six months; no excessive sugar intake; eats a lot of bread.  White bread.    5. Anxiety and depression: Patient reports good compliance with medication, good tolerance to medication, and good symptom control.  Stable.    6. GERD: stopped Nexium; ran out of it.  After first of year, price was going up.  Recommended alternative; sent 30 day supply of generic Nexium.   Review of Systems  Constitutional: Negative for fever, chills, diaphoresis, activity change, appetite change and fatigue.  Respiratory: Negative for cough and shortness of breath.   Cardiovascular: Negative for chest pain, palpitations and leg swelling.  Gastrointestinal: Negative for nausea, vomiting, abdominal pain and diarrhea.  Endocrine: Negative for cold intolerance, heat intolerance, polydipsia, polyphagia and polyuria.  Skin: Negative for color change, rash and wound.  Neurological: Negative for dizziness, tremors, seizures, syncope, facial asymmetry, speech difficulty, weakness, light-headedness, numbness and headaches.  Psychiatric/Behavioral: Negative for sleep disturbance and dysphoric mood. The patient is not nervous/anxious.     Past Medical History  Diagnosis Date  . Hyperlipidemia   . COPD (chronic obstructive pulmonary disease)   . Depression   .  GERD (gastroesophageal reflux disease)   . Colon polyps 08/2012    colonoscopy; multiple colon polyps; repeat colonoscopy in one year.  . Status post dilation of esophageal narrowing 2000  . Peptic ulcer   . Hypertension   . CARDIOMYOPATHY 03/24/2010    did not tolerate Toprol and Lisinopril.   . Insomnia   . Hemorrhoids   . MI (myocardial infarction)   . Atherosclerosis   . Anxiety   . Headache(784.0)   . Tinea pedis   . Myalgia   . Helicobacter pylori (H. pylori)   . Thyroid dysfunction   . Chest pain   . Back pain   . Hernia     inguinal  . Allergic rhinitis   . Bronchitis     09-14-11  . Chronic airway obstruction, not elsewhere classified   . Coronary artery disease 11/2009    Anterior MI with late presentation 11/2009. Cath showed a 100% proximal LAD occlusion, 99% mid RCA. Had a PCI and 2 DES to both LAD and RCA. EF was 35%. Nuclear stress test 05/13: prior anterior/inferior infarcts without ischemic, EF 43%, cardiac cath in 02/14: Widely patent stents with no other obstructive disease. EF 40%    Past Surgical History  Procedure Laterality Date  . Penile prosthesis implant    . Esophagogastroduodenoscopy  2008  . Colonoscopy    . Esophageal dilation    . Hernia repair  07/20/2012    L inguinal hernia repair  . Admission  12/20/2012    COPD exacerbation.  [place].  . Esophagogastroduodenoscopy  08/20/2012  . Cardiac catheterization    . Coronary  angioplasty with stent placement  09/2009    LAD 3.0 X23 mm Xience DES, RCA: 4.0 X 15 mm Xience DES  . Cardiac catheterization  02/05/13    [facility]  . Cardiac catheterization  10/14    [facility] : patent stents with no change in anatomy. EF: 40$   Allergies  Allergen Reactions  . Wellbutrin [Bupropion] Other (See Comments)    Unknown/"made me feel real funny"   Current Outpatient Prescriptions  Medication Sig Dispense Refill  . albuterol (PROVENTIL) (2.5 MG/3ML) 0.083% nebulizer solution ONE VIAL VIA NEBULIZER EVERY 6 HOURS AS NEEDED  FOR WHEEZING OR SHORTNESS OF BREATH 300 mL 0  . albuterol (VENTOLIN HFA) 108 (90 BASE) MCG/ACT inhaler Inhale 2 puffs into the lungs every 6 (six) hours as needed. For wheezing 54 g 11  . aspirin 81 MG tablet Take 81 mg by mouth daily.      [name] atorvastatin (LIPITOR) 20 MG tablet TAKE 1 TABLET DAILY 90 tablet 3  . clopidogrel (PLAVIX) 75 MG tablet Take 1 tablet (75 mg total) by mouth daily. 90 tablet 3  . Fluticasone-Salmeterol (ADVAIR DISKUS) 500-50 MCG/DOSE AEPB Inhale 2 puffs into the lungs daily. 3 each 3  . LORazepam (ATIVAN) 0.5 MG tablet Take 1 tablet (0.5 mg total) by mouth daily as needed for anxiety. 30 tablet 0  . losartan (COZAAR) 25 MG tablet Take 1 tablet (25 mg total) by mouth daily. 90 tablet 3  . nitroGLYCERIN (NITROSTAT) 0.4 MG SL tablet Place 1 tablet (0.4 mg total) under the tongue every 5 (five) minutes as needed. 25 tablet 3  . sertraline (ZOLOFT) 100 MG tablet Take 1-2 tablets (100-200 mg total) by mouth daily. 180 tablet 3  . sucralfate (CARAFATE) 1 GM/10ML suspension TAKE 10ML( 2 TEASPOONSFUL) BY MOUTH FOUR TIMES DAILY 3600 mL 1  . tiotropium (SPIRIVA) 18 MCG inhalation capsule Place 1 capsule (18 mcg total) into inhaler and inhale daily. 90 capsule 3  . esomeprazole (NEXIUM) 40 MG capsule Take 1 capsule (40 mg total) by mouth 2 (two) times daily before a meal. 180 capsule 3  . varenicline (CHANTIX CONTINUING MONTH PAK) 1 MG tablet Take 1 tablet (1 mg total) by mouth 2 (two) times daily. 60 tablet 2  . varenicline (CHANTIX STARTING MONTH PAK) 0.5 MG X 11 & 1 MG X 42 tablet Take one 0.5 mg tablet by mouth once daily for 3 days, then increase to one 0.5 mg tablet twice daily for 4 days, then increase to one 1 mg tablet twice daily. 53 tablet 0   Current Facility-Administered Medications  Medication Dose Route Frequency Provider Last Rate Last Dose  . albuterol (PROVENTIL) (2.5 MG/3ML) 0.083% nebulizer solution 2.5 mg  2.5 mg Nebulization Once [name], MD             Objective:    BP 118/71 mmHg  Pulse 70  Temp(Src) 98 F (36.7 C) (Oral)  Resp 16  Ht 5' 8.5" (1.74 m)  Wt 150 lb 12.8 oz (68.402 kg)  BMI 22.59 kg/m2  SpO2 96% Physical Exam  Constitutional: He is oriented to person, place, and time. He appears well-developed and well-nourished. No distress.  HENT:  Head: Normocephalic and atraumatic.  Right Ear: External ear normal.  Left Ear: External ear normal.  Nose: Nose normal.  Mouth/Throat: Oropharynx is clear and moist.  Eyes: Conjunctivae and EOM are normal. Pupils are equal, round, and reactive to light.  Neck: Normal range of motion. Neck supple. Carotid bruit is not  present. No thyromegaly present.  Cardiovascular: Normal rate, regular rhythm, normal heart sounds and intact distal pulses.  Exam reveals no gallop and no friction rub.   No murmur heard. Pulmonary/Chest: Effort normal and breath sounds normal. He has no wheezes. He has no rales.  Abdominal: Soft. Bowel sounds are normal. He exhibits no distension and no mass. There is no tenderness. There is no rebound and no guarding.  Lymphadenopathy:    He has no cervical adenopathy.  Neurological: He is alert and oriented to person, place, and time. No cranial nerve deficit.  Skin: Skin is warm and dry. No rash noted. He is not diaphoretic.  Psychiatric: He has a normal mood and affect. His behavior is normal.  Nursing note and vitals reviewed.  Results for orders placed or performed in visit on 06/13/15  PSA  Result Value Ref Range   PSA 2.29 <=4.00 ng/mL       Assessment & Plan:   1. Essential hypertension   2. Hyperlipidemia   3. Glucose intolerance (impaired glucose tolerance)   4. Gastroesophageal reflux disease, esophagitis presence not specified   5. DEPRESSION/ANXIETY   6. COPD, very severe   7. Atherosclerosis of native coronary artery of native heart without angina pectoris   8. Prostate cancer screening   9. Lung nodule, multiple     Meds ordered this  encounter  Medications  . sertraline (ZOLOFT) 100 MG tablet    Sig: Take 1-2 tablets (100-200 mg total) by mouth daily.    Dispense:  180 tablet    Refill:  3  . esomeprazole (NEXIUM) 40 MG capsule    Sig: Take 1 capsule (40 mg total) by mouth 2 (two) times daily before a meal.    Dispense:  180 capsule    Refill:  3  . varenicline (CHANTIX STARTING MONTH PAK) 0.5 MG X 11 & 1 MG X 42 tablet    Sig: Take one 0.5 mg tablet by mouth once daily for 3 days, then increase to one 0.5 mg tablet twice daily for 4 days, then increase to one 1 mg tablet twice daily.    Dispense:  53 tablet    Refill:  0  . varenicline (CHANTIX CONTINUING MONTH PAK) 1 MG tablet    Sig: Take 1 tablet (1 mg total) by mouth 2 (two) times daily.    Dispense:  60 tablet    Refill:  2    Return in about 4 months (around 10/13/2015) for recheck.     Leslie Sims, M.D. [facility] [address] [place], [place]  [address] [phone] phone [phone] fax

## 2015-06-14 LAB — PSA: PSA: 2.29 ng/mL (ref <=4.00)

## 2015-06-18 ENCOUNTER — Ambulatory Visit
Admission: RE | Admit: 2015-06-18 | Discharge: 2015-06-18 | Disposition: A | Discharge status: Home / Self Care | Payer: BLUE CROSS/BLUE SHIELD | Source: Ambulatory Visit | Attending: Family Medicine | Admitting: Family Medicine

## 2015-06-18 DIAGNOSIS — J439 Emphysema, unspecified: Secondary | ICD-10-CM | POA: Diagnosis not present

## 2015-06-18 DIAGNOSIS — R918 Other nonspecific abnormal finding of lung field: Secondary | ICD-10-CM | POA: Diagnosis not present

## 2015-06-18 DIAGNOSIS — D3502 Benign neoplasm of left adrenal gland: Secondary | ICD-10-CM | POA: Insufficient documentation

## 2015-06-18 DIAGNOSIS — D3501 Benign neoplasm of right adrenal gland: Secondary | ICD-10-CM | POA: Diagnosis not present

## 2015-06-18 IMAGING — CT CT CHEST W/O CM
2 of 3 series · 15 of 36 positions shown, 18 images · non-contrast
Comparison: Chest CT [DATE].  No other comparison CT.

CLINICAL DATA: Follow up pulmonary nodule. No history of
malignancy. Cough, congestion and left-sided chest pain for years.
Subsequent encounter.

EXAM:
CT CHEST WITHOUT CONTRAST
TECHNIQUE: Multidetector CT imaging of the chest was performed following the
standard protocol without IV contrast.

[Series 2: routine chest wo · axial · 0.76mm/px · z∈[-680,-395]mm · 12 of 67 slices shown, 15 images]
[im 5/67  mediastinal]
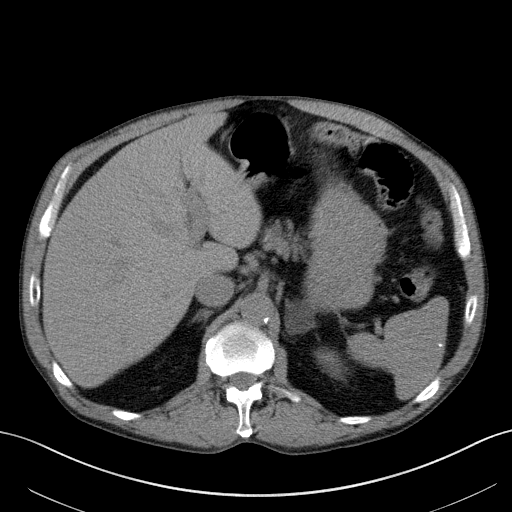
[im 5/67  lung]
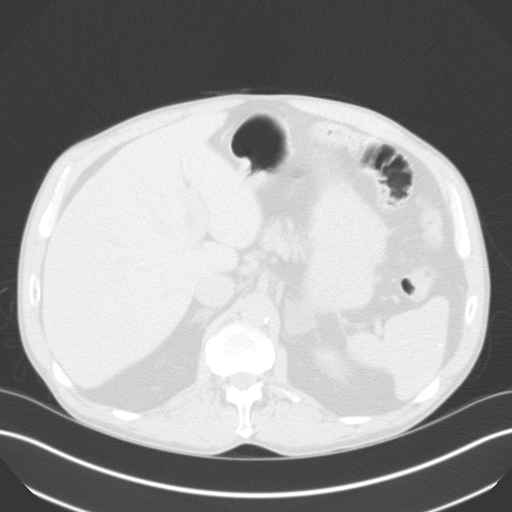
[im 10/67  lung]
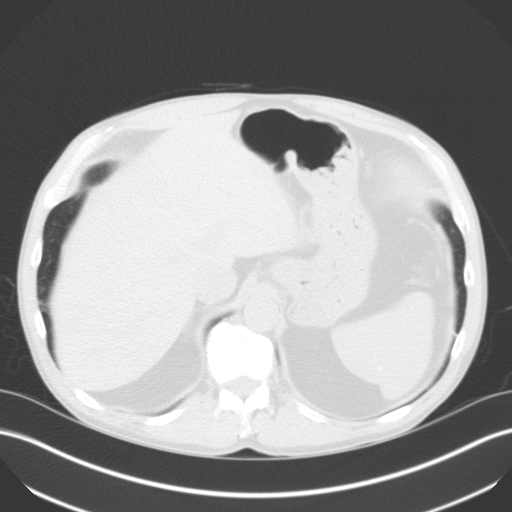
[im 15/67  lung]
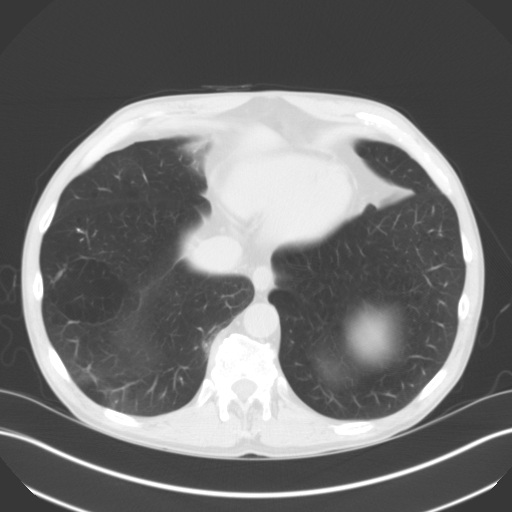
[im 20/67  lung]
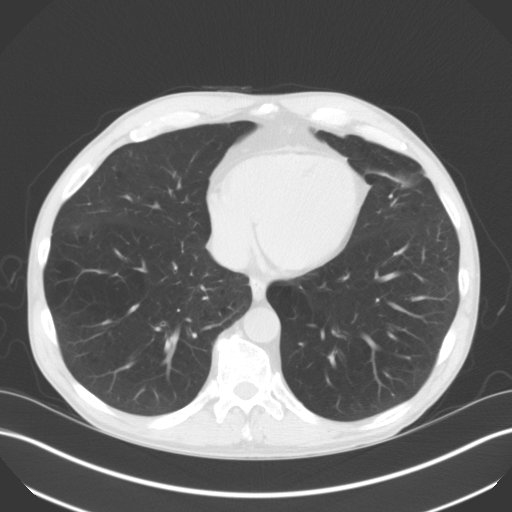
[im 25/67  mediastinal]
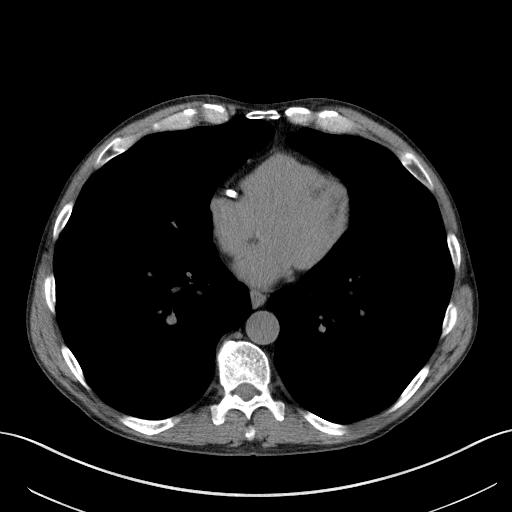
[im 25/67  lung]
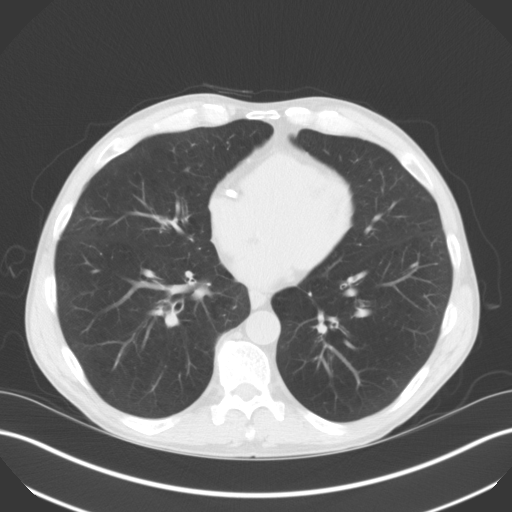
[im 30/67  lung]
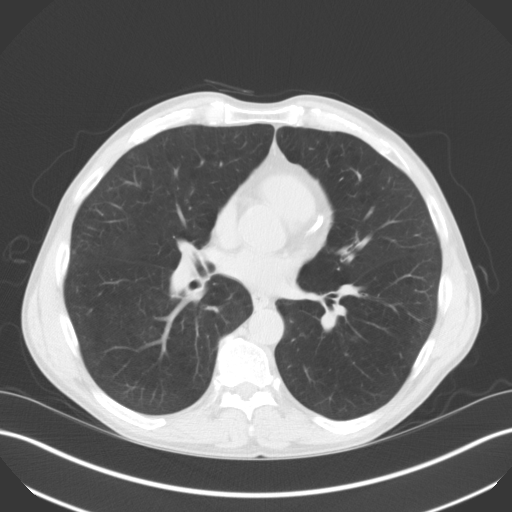
[im 37/67  lung]
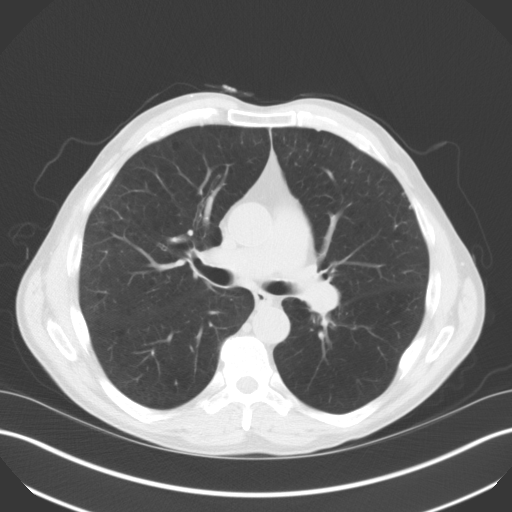
[im 42/67  lung]
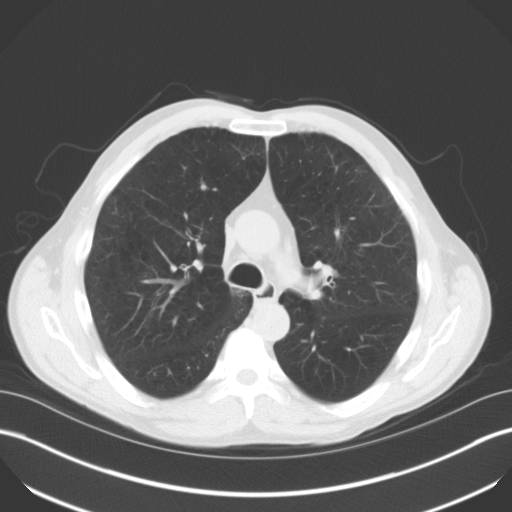
[im 47/67  mediastinal]
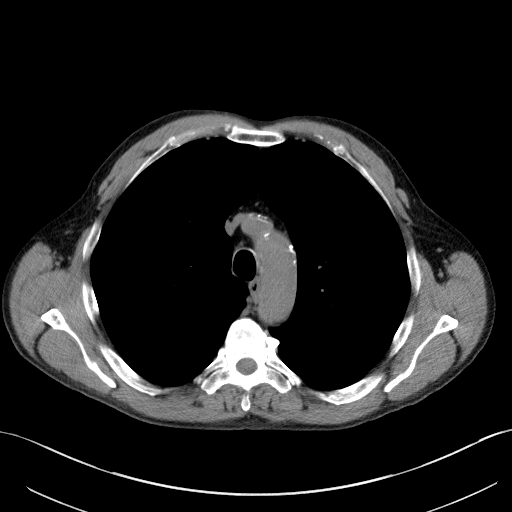
[im 47/67  lung]
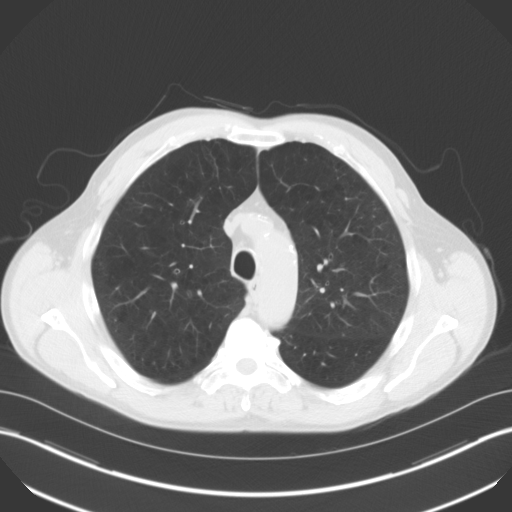
[im 52/67  lung]
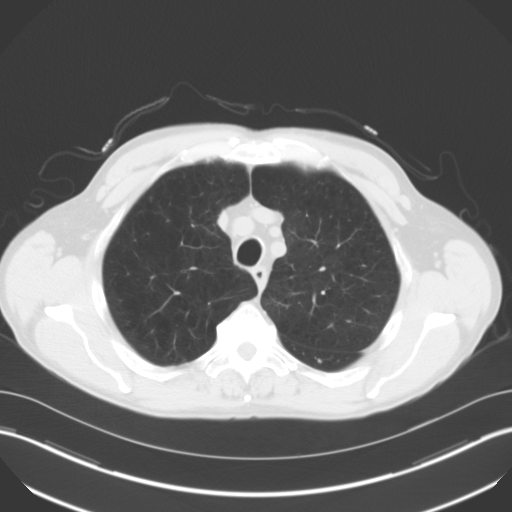
[im 57/67  lung]
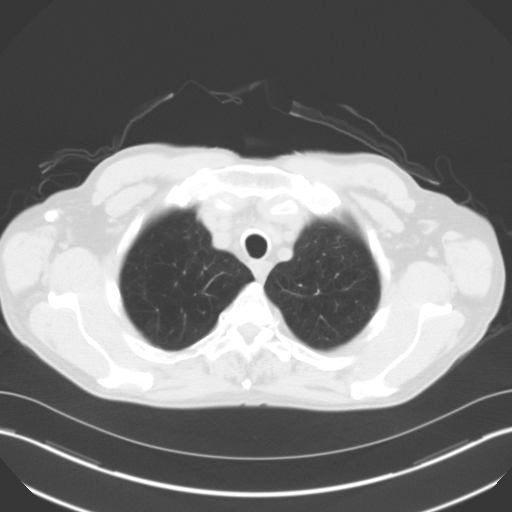
[im 62/67  lung]
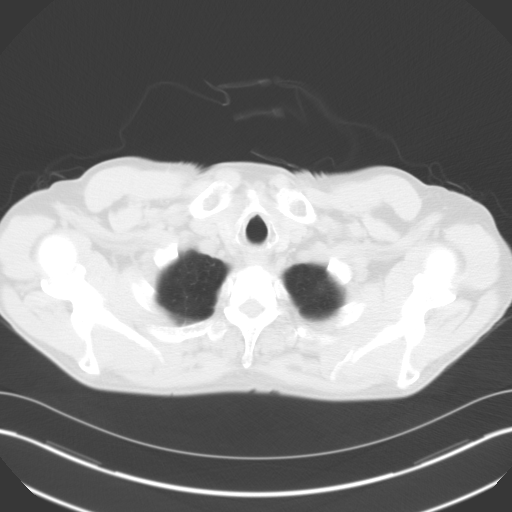

[Series 5: cor routine chest wo · coronal · 0.68mm/px · 3 of 138 slices shown]
[im 28/138  lung]
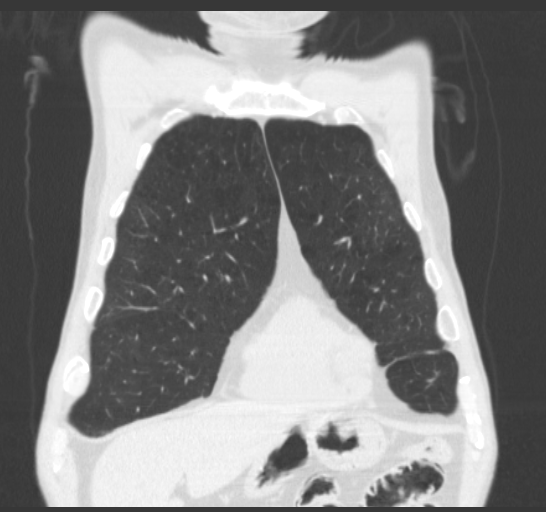
[im 55/138  lung]
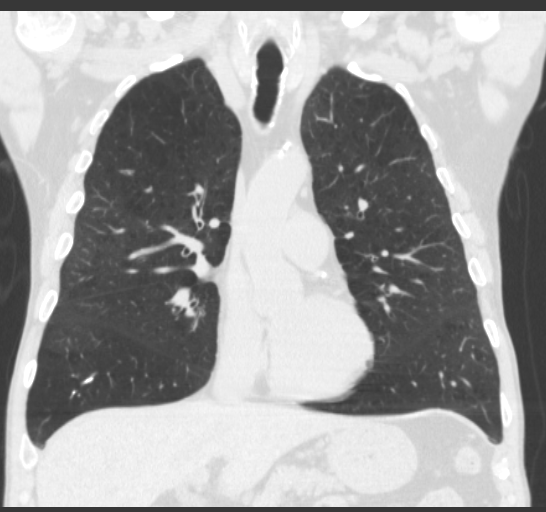
[im 83/138  lung]
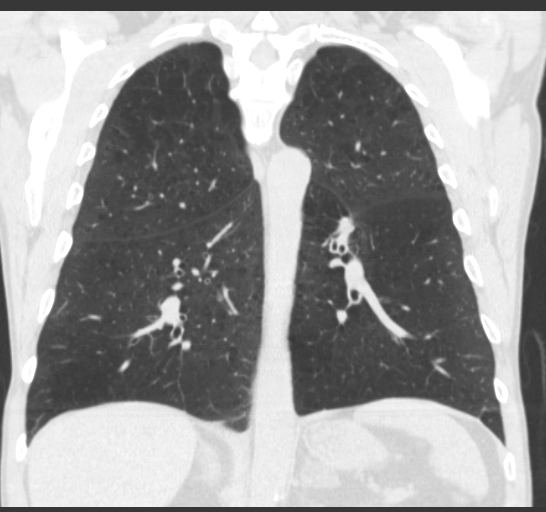

[15 of 36 positions shown; findings below may reference images not displayed]

FINDINGS: Mediastinum/Nodes: There are no enlarged mediastinal, hilar or
axillary lymph nodes. Mild thyroid heterogeneity is stable. The
heart size is normal. There is no pericardial
effusion.Atherosclerosis of the aorta, great vessels and coronary
arteries noted. There are coronary artery stents.

Lungs/Pleura: There is no pleural effusion. There is an error in the
findings section of the previous report. All of the described
pulmonary nodules are in the left lung. Both nodules in the superior
segment of the lower lobe have decreased in size, measuring 3 mm on
image 16 and 6 mm on image 19. 3 mm left upper lobe nodule on image
number 8 is unchanged. There are stable branching calcifications in
the right lower lobe. No new, enlarging or morphologically
suspicious pulmonary nodules demonstrated. Moderate emphysematous
changes are noted.

Upper abdomen: Stable appearance. Bilateral adrenal adenomas (larger
on the left) are unchanged. There are small splenic granulomas.

Musculoskeletal/Chest wall: There is no chest wall mass or
suspicious osseous finding. Rounded calcification within the
subcutaneous fat anterior to the right shoulder is again noted.
IMPRESSION: 1. Moderate emphysema.
2. Previously described small left lung nodules are stable to
slightly decreased in size, consistent with a benign etiology
(likely postinflammatory). No suspicious nodules.
3. Stable bilateral adrenal adenomas.

## 2015-06-19 ENCOUNTER — Ambulatory Visit: Admission: RE | Admit: 2015-06-19 | Payer: BLUE CROSS/BLUE SHIELD | Source: Ambulatory Visit

## 2015-07-10 ENCOUNTER — Telehealth: Payer: Self-pay | Admitting: *Deleted

## 2015-07-10 NOTE — Telephone Encounter (Signed)
We need an updated address; A letter was mailed to make patient aware that Dr. Lake Bells is no longer seeing patient in the [facility] office. Pt can go the [place] if he would like to continue seeing Dr. Lake Bells. OR  Pt can also be seen in the [place] office if he is not able to go to [place].

## 2015-08-14 ENCOUNTER — Other Ambulatory Visit: Payer: Self-pay | Admitting: Family Medicine

## 2015-08-16 ENCOUNTER — Ambulatory Visit
Admission: EM | Admit: 2015-08-16 | Discharge: 2015-08-16 | Disposition: A | Discharge status: Home / Self Care | Payer: BLUE CROSS/BLUE SHIELD | Attending: Family Medicine | Admitting: Family Medicine

## 2015-08-16 DIAGNOSIS — J441 Chronic obstructive pulmonary disease with (acute) exacerbation: Secondary | ICD-10-CM

## 2015-08-16 MED ORDER — SULFAMETHOXAZOLE-TRIMETHOPRIM 800-160 MG PO TABS: 1.0000 | ORAL_TABLET | Freq: Two times a day (BID) | ORAL | Status: DC

## 2015-08-16 MED ORDER — PREDNISONE 20 MG PO TABS: ORAL_TABLET | ORAL | Status: DC

## 2015-08-16 NOTE — ED Notes (Signed)
Increasing shortness of breath for the past few weeks, getting worse.  Still smoking.

## 2015-08-16 NOTE — ED Provider Notes (Signed)
CSN: [id number]     Arrival date & time 08/16/15  1346 History   First MD Initiated Contact with Patient 08/16/15 1443     Chief Complaint  Patient presents with  . Shortness of Breath   (Consider location/radiation/quality/duration/timing/severity/associated sxs/prior Treatment) HPI Comments: 64 yo male with a 2 weeks h/o increased wheezing and productive cough. Has a h/o COPD and has been doing  his home nebulizer treatments, Advair and Spiriva. Denies fevers, chills, chest pains.   Patient is a 64 y.o. male presenting with shortness of breath. The history is provided by the patient.  Shortness of Breath   Past Medical History  Diagnosis Date  . Hyperlipidemia   . COPD (chronic obstructive pulmonary disease)   . Depression   . GERD (gastroesophageal reflux disease)   . Colon polyps 08/2012    colonoscopy; multiple colon polyps; repeat colonoscopy in one year.  . Status post dilation of esophageal narrowing 2000  . Peptic ulcer   . Hypertension   . CARDIOMYOPATHY 03/24/2010    did not tolerate Toprol and Lisinopril.   . Insomnia   . Hemorrhoids   . MI (myocardial infarction)   . Atherosclerosis   . Anxiety   . Headache(784.0)   . Tinea pedis   . Myalgia   . Helicobacter pylori (H. pylori)   . Thyroid dysfunction   . Chest pain   . Back pain   . Hernia     inguinal  . Allergic rhinitis   . Bronchitis     09-14-11  . Chronic airway obstruction, not elsewhere classified   . Coronary artery disease 11/2009    Anterior MI with late presentation 11/2009. Cath showed a 100% proximal LAD occlusion, 99% mid RCA. Had a PCI and 2 DES to both LAD and RCA. EF was 35%. Nuclear stress test 05/13: prior anterior/inferior infarcts without ischemic, EF 43%, cardiac cath in 02/14: Widely patent stents with no other obstructive disease. EF 40%    Past Surgical History  Procedure Laterality Date  . Penile prosthesis implant    . Esophagogastroduodenoscopy  2008  . Colonoscopy    .  Esophageal dilation    . Hernia repair  07/20/2012    L inguinal hernia repair  . Admission  12/20/2012    COPD exacerbation.  [place].  . Esophagogastroduodenoscopy  08/20/2012  . Cardiac catheterization    . Coronary angioplasty with stent placement  09/2009    LAD 3.0 X23 mm Xience DES, RCA: 4.0 X 15 mm Xience DES  . Cardiac catheterization  02/05/13    [facility]  . Cardiac catheterization  10/14    [facility] : patent stents with no change in anatomy. EF: 40$   Family History  Problem Relation Age of Onset  . Heart attack Brother     Brother #1  . Diabetes Brother     brother #2  . Hypertension Brother     #3  . Coronary artery disease Father 63    deceased  . Heart attack Father   . Diabetes Father   . Heart disease Father   . COPD Mother 26    deceased  . Heart attack Sister 62    acute-cocaine abuse/deceased  . Alcohol abuse Sister     polysubstance abuse  . COPD Sister   . Alcohol abuse Sister     polysubstance abuse  . Heart disease Brother     AMI x multiple   Social History  Substance Use Topics  . Smoking status:  Current Every Day Smoker -- 1.50 packs/day for 42 years    Types: Cigarettes  . Smokeless tobacco: Never Used  . Alcohol Use: No    Review of Systems  Respiratory: Positive for shortness of breath.     Allergies  Wellbutrin  Home Medications   Prior to Admission medications   Medication Sig Start Date End Date Taking? Authorizing Provider  albuterol (PROVENTIL) (2.5 MG/3ML) 0.083% nebulizer solution USE 1 VIAL VIA NEBULIZER EVERY 6 HOURS AS NEEDED FOR WHEEZING OR SHORTNESS OF BREATH 08/15/15   [name], MD  albuterol (VENTOLIN HFA) 108 (90 BASE) MCG/ACT inhaler Inhale 2 puffs into the lungs every 6 (six) hours as needed. For wheezing 11/07/14   [name], MD  aspirin 81 MG tablet Take 81 mg by mouth daily.      Historical Provider, MD  atorvastatin (LIPITOR) 20 MG tablet TAKE 1 TABLET DAILY 05/08/15   Wellington Hampshire, MD  clopidogrel (PLAVIX) 75  MG tablet Take 1 tablet (75 mg total) by mouth daily. 01/31/15   Wellington Hampshire, MD  esomeprazole (NEXIUM) 40 MG capsule Take 1 capsule (40 mg total) by mouth 2 (two) times daily before a meal. 06/13/15   [name], MD  Fluticasone-Salmeterol (ADVAIR DISKUS) 500-50 MCG/DOSE AEPB Inhale 2 puffs into the lungs daily. 11/07/14   [name], MD  LORazepam (ATIVAN) 0.5 MG tablet Take 1 tablet (0.5 mg total) by mouth daily as needed for anxiety. 01/23/15   [name], MD  losartan (COZAAR) 25 MG tablet Take 1 tablet (25 mg total) by mouth daily. 01/31/15   Wellington Hampshire, MD  nitroGLYCERIN (NITROSTAT) 0.4 MG SL tablet Place 1 tablet (0.4 mg total) under the tongue every 5 (five) minutes as needed. 03/13/15   Minna Merritts, MD  predniSONE (DELTASONE) 20 MG tablet 3 tabs po qd for 3 days, then 2 tabs po qd for 4 days, then 1 tab po qd for 4 days, then half a tab po qd for 3 days 08/16/15   Norval Gable, MD  sertraline (ZOLOFT) 100 MG tablet Take 1-2 tablets (100-200 mg total) by mouth daily. 06/13/15   [name], MD  sucralfate (CARAFATE) 1 GM/10ML suspension TAKE 10ML( 2 TEASPOONSFUL) BY MOUTH FOUR TIMES DAILY 01/21/15   [name], MD  sulfamethoxazole-trimethoprim (BACTRIM DS,SEPTRA DS) 800-160 MG per tablet Take 1 tablet by mouth 2 (two) times daily. 08/16/15   Norval Gable, MD  tiotropium (SPIRIVA) 18 MCG inhalation capsule Place 1 capsule (18 mcg total) into inhaler and inhale daily. 11/07/14   [name], MD  varenicline (CHANTIX CONTINUING MONTH PAK) 1 MG tablet Take 1 tablet (1 mg total) by mouth 2 (two) times daily. 06/13/15   [name], MD  varenicline (CHANTIX STARTING MONTH PAK) 0.5 MG X 11 & 1 MG X 42 tablet Take one 0.5 mg tablet by mouth once daily for 3 days, then increase to one 0.5 mg tablet twice daily for 4 days, then increase to one 1 mg tablet twice daily. 06/13/15   [name], MD   Meds Ordered and Administered this Visit  Medications - No data to  display  BP 110/82 mmHg  Pulse 80  Temp(Src) 98.1 F (36.7 C) (Oral)  Resp 22  Ht 5\' 10"  (1.778 m)  Wt 160 lb (72.576 kg)  BMI 22.96 kg/m2  SpO2 95% No data found.   Physical Exam  Constitutional: He appears well-developed and well-nourished. No  distress.  HENT:  Head: Normocephalic and atraumatic.  Right Ear: External ear normal.  Left Ear: External ear normal.  Nose: Nose normal.  Mouth/Throat: Uvula is midline, oropharynx is clear and moist and mucous membranes are normal. No oropharyngeal exudate or tonsillar abscesses.  Eyes: Conjunctivae and EOM are normal. Pupils are equal, round, and reactive to light. Right eye exhibits no discharge. Left eye exhibits no discharge. No scleral icterus.  Neck: Normal range of motion. Neck supple. No tracheal deviation present. No thyromegaly present.  Cardiovascular: Normal rate, regular rhythm and normal heart sounds.   Pulmonary/Chest: Effort normal. No stridor. No respiratory distress. He has wheezes (few expiratory wheezes and diffuse rhonchi). He has no rales. He exhibits no tenderness.  Lymphadenopathy:    He has no cervical adenopathy.  Neurological: He is alert.  Skin: Skin is warm and dry. No rash noted. He is not diaphoretic.  Nursing note and vitals reviewed.   ED Course  Procedures (including critical care time)  Labs Review Labs Reviewed - No data to display  Imaging Review No results found.   Visual Acuity Review  Right Eye Distance:   Left Eye Distance:   Bilateral Distance:    Right Eye Near:   Left Eye Near:    Bilateral Near:         MDM   1. COPD with exacerbation    Discharge Medication List as of 08/16/2015  3:09 PM    START taking these medications   Details  predniSONE (DELTASONE) 20 MG tablet 3 tabs po qd for 3 days, then 2 tabs po qd for 4 days, then 1 tab po qd for 4 days, then half a tab po qd for 3 days, Print    sulfamethoxazole-trimethoprim (BACTRIM DS,SEPTRA DS) 800-160 MG per  tablet Take 1 tablet by mouth 2 (two) times daily., Starting 08/16/2015, Until Discontinued, Print      Plan: 1. diagnosis reviewed with patient 2. rx as per orders; risks, benefits, potential side effects reviewed with patient 3. Recommend continue current home meds/inhalers; recommend smoking cessation 4. F/u prn if symptoms worsen or don't improve    Norval Gable, MD 08/16/15 1635

## 2015-08-27 ENCOUNTER — Other Ambulatory Visit: Payer: Self-pay | Admitting: Family Medicine

## 2015-08-28 ENCOUNTER — Other Ambulatory Visit: Payer: Self-pay

## 2015-08-28 MED ORDER — LORAZEPAM 0.5 MG PO TABS: 0.5000 mg | ORAL_TABLET | Freq: Every day | ORAL | Status: DC | PRN

## 2015-08-28 NOTE — Telephone Encounter (Signed)
Approved refill of Lorazepam.  Please call into pharmacy as approved.

## 2015-08-28 NOTE — Telephone Encounter (Signed)
Pharm reqs RF of lorazepam. Pended. 

## 2015-08-29 NOTE — Telephone Encounter (Signed)
Called in.

## 2015-09-01 ENCOUNTER — Telehealth: Payer: Self-pay | Admitting: Family Medicine

## 2015-09-01 MED ORDER — PREDNISONE 20 MG PO TABS: ORAL_TABLET | ORAL | Status: DC

## 2015-09-01 MED ORDER — AMOXICILLIN-POT CLAVULANATE 875-125 MG PO TABS: 1.0000 | ORAL_TABLET | Freq: Two times a day (BID) | ORAL | Status: DC

## 2015-09-01 NOTE — Telephone Encounter (Signed)
Pt called; requesting medication for congestion, cough. S/p evaluation by [facility]; treated with Septra, Prednisone.  Requesting further treatment.  A/P:  COPD exacerbation: sent in Augmentin and Prednisone; start Claritin.

## 2015-09-28 ENCOUNTER — Ambulatory Visit (INDEPENDENT_AMBULATORY_CARE_PROVIDER_SITE_OTHER): Payer: BLUE CROSS/BLUE SHIELD

## 2015-09-28 ENCOUNTER — Ambulatory Visit (INDEPENDENT_AMBULATORY_CARE_PROVIDER_SITE_OTHER): Payer: BLUE CROSS/BLUE SHIELD | Admitting: Family Medicine

## 2015-09-28 VITALS — BP 128/72 | HR 94 | Temp 98.5°F | Resp 16 | Ht 68.0 in | Wt 156.0 lb

## 2015-09-28 DIAGNOSIS — R0600 Dyspnea, unspecified: Secondary | ICD-10-CM | POA: Diagnosis not present

## 2015-09-28 DIAGNOSIS — I2510 Atherosclerotic heart disease of native coronary artery without angina pectoris: Secondary | ICD-10-CM | POA: Diagnosis not present

## 2015-09-28 DIAGNOSIS — J441 Chronic obstructive pulmonary disease with (acute) exacerbation: Secondary | ICD-10-CM | POA: Diagnosis not present

## 2015-09-28 DIAGNOSIS — F341 Dysthymic disorder: Secondary | ICD-10-CM

## 2015-09-28 DIAGNOSIS — R0789 Other chest pain: Secondary | ICD-10-CM | POA: Diagnosis not present

## 2015-09-28 LAB — POCT CBC
Granulocyte percent: 73.1 %G (ref 37–80)
HCT, POC: 45.9 % (ref 43.5–53.7)
Hemoglobin: 14.3 g/dL (ref 14.1–18.1)
LYMPH, POC: 1.2 (ref 0.6–3.4)
MCH, POC: 27.9 pg (ref 27–31.2)
MCHC: 31.1 g/dL — AB (ref 31.8–35.4)
MCV: 89.7 fL (ref 80–97)
MID (CBC): 0.4 (ref 0–0.9)
MPV: 6.2 fL (ref 0–99.8)
PLATELET COUNT, POC: 239 10*3/uL (ref 142–424)
POC Granulocyte: 4.5 (ref 2–6.9)
POC LYMPH %: 20.1 % (ref 10–50)
POC MID %: 6.8 %M (ref 0–12)
RBC: 5.12 M/uL (ref 4.69–6.13)
RDW, POC: 15.2 %
WBC: 6.2 10*3/uL (ref 4.6–10.2)

## 2015-09-28 IMAGING — CR DG CHEST 2V
3 series · 3 of 3 positions shown · non-contrast
Comparison: Chest CT [DATE]; chest radiograph [DATE]

CLINICAL DATA: Patient shortness of breath and burning sensation in
the chest.

EXAM:
CHEST  2 VIEW

[PA (1 of 2)]
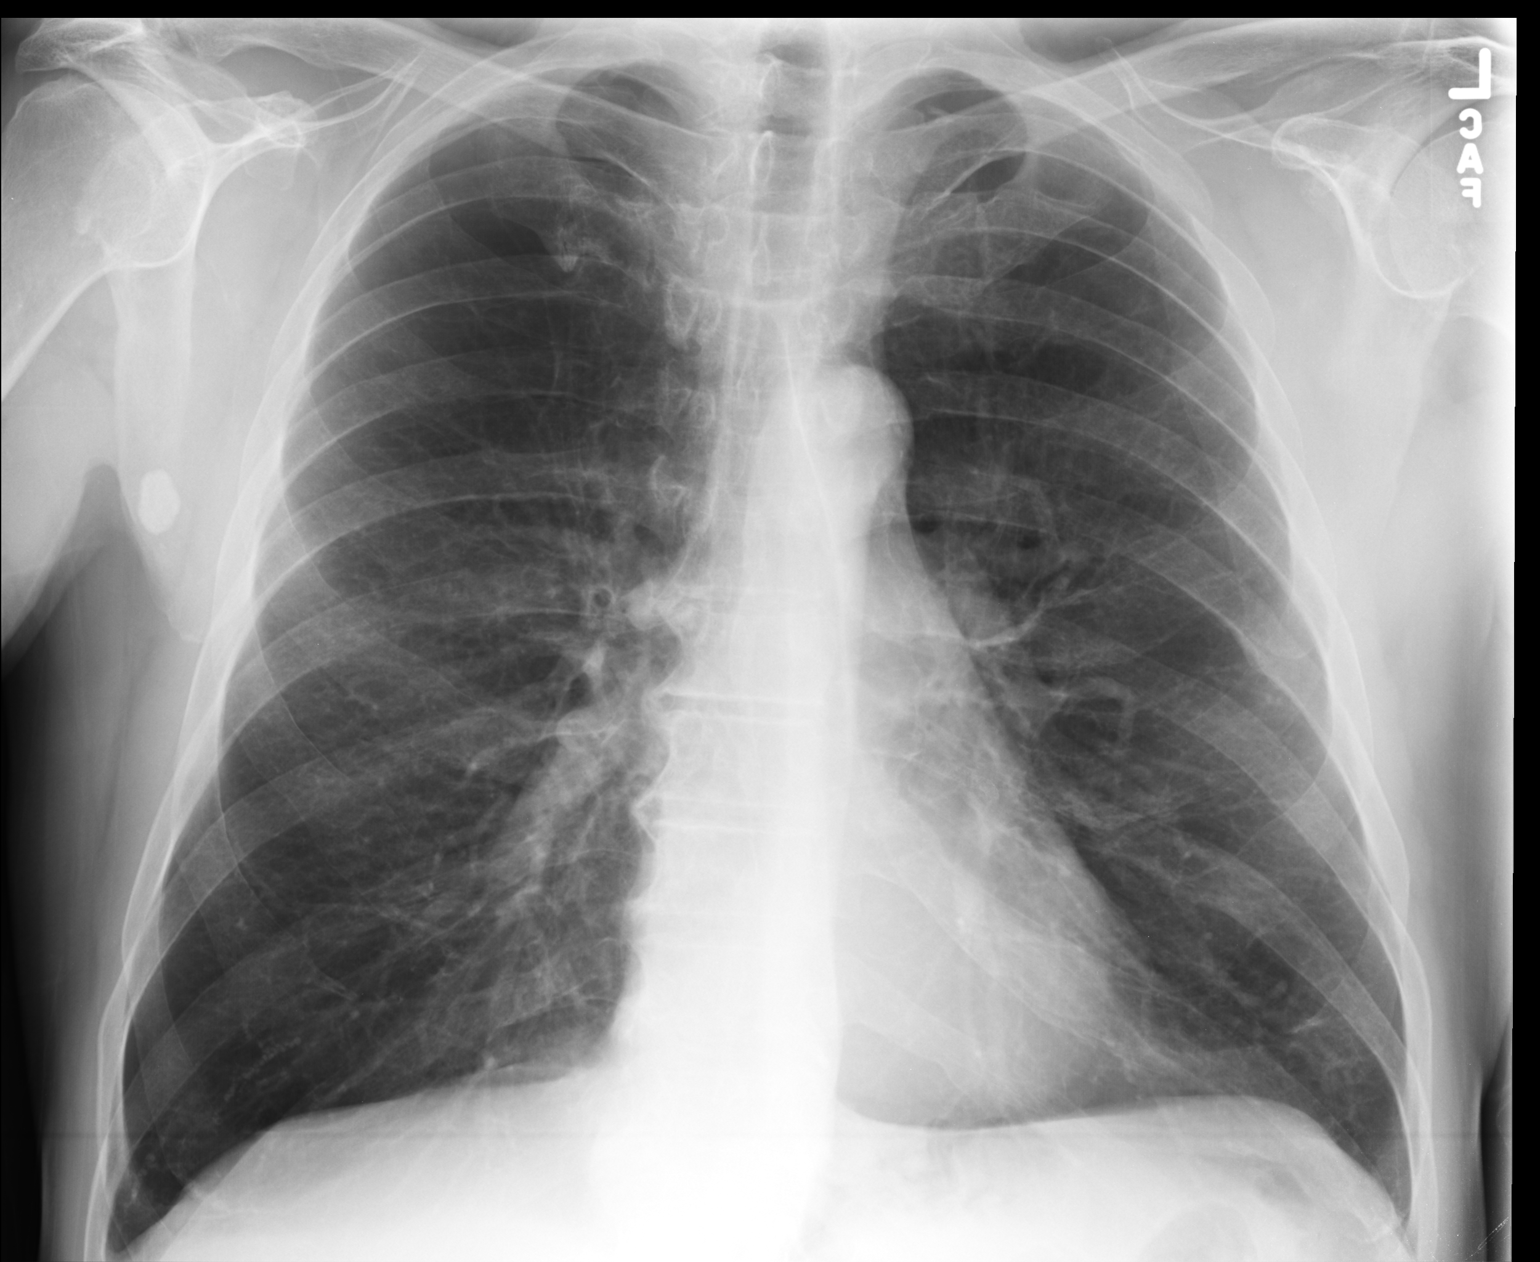

[lateral]
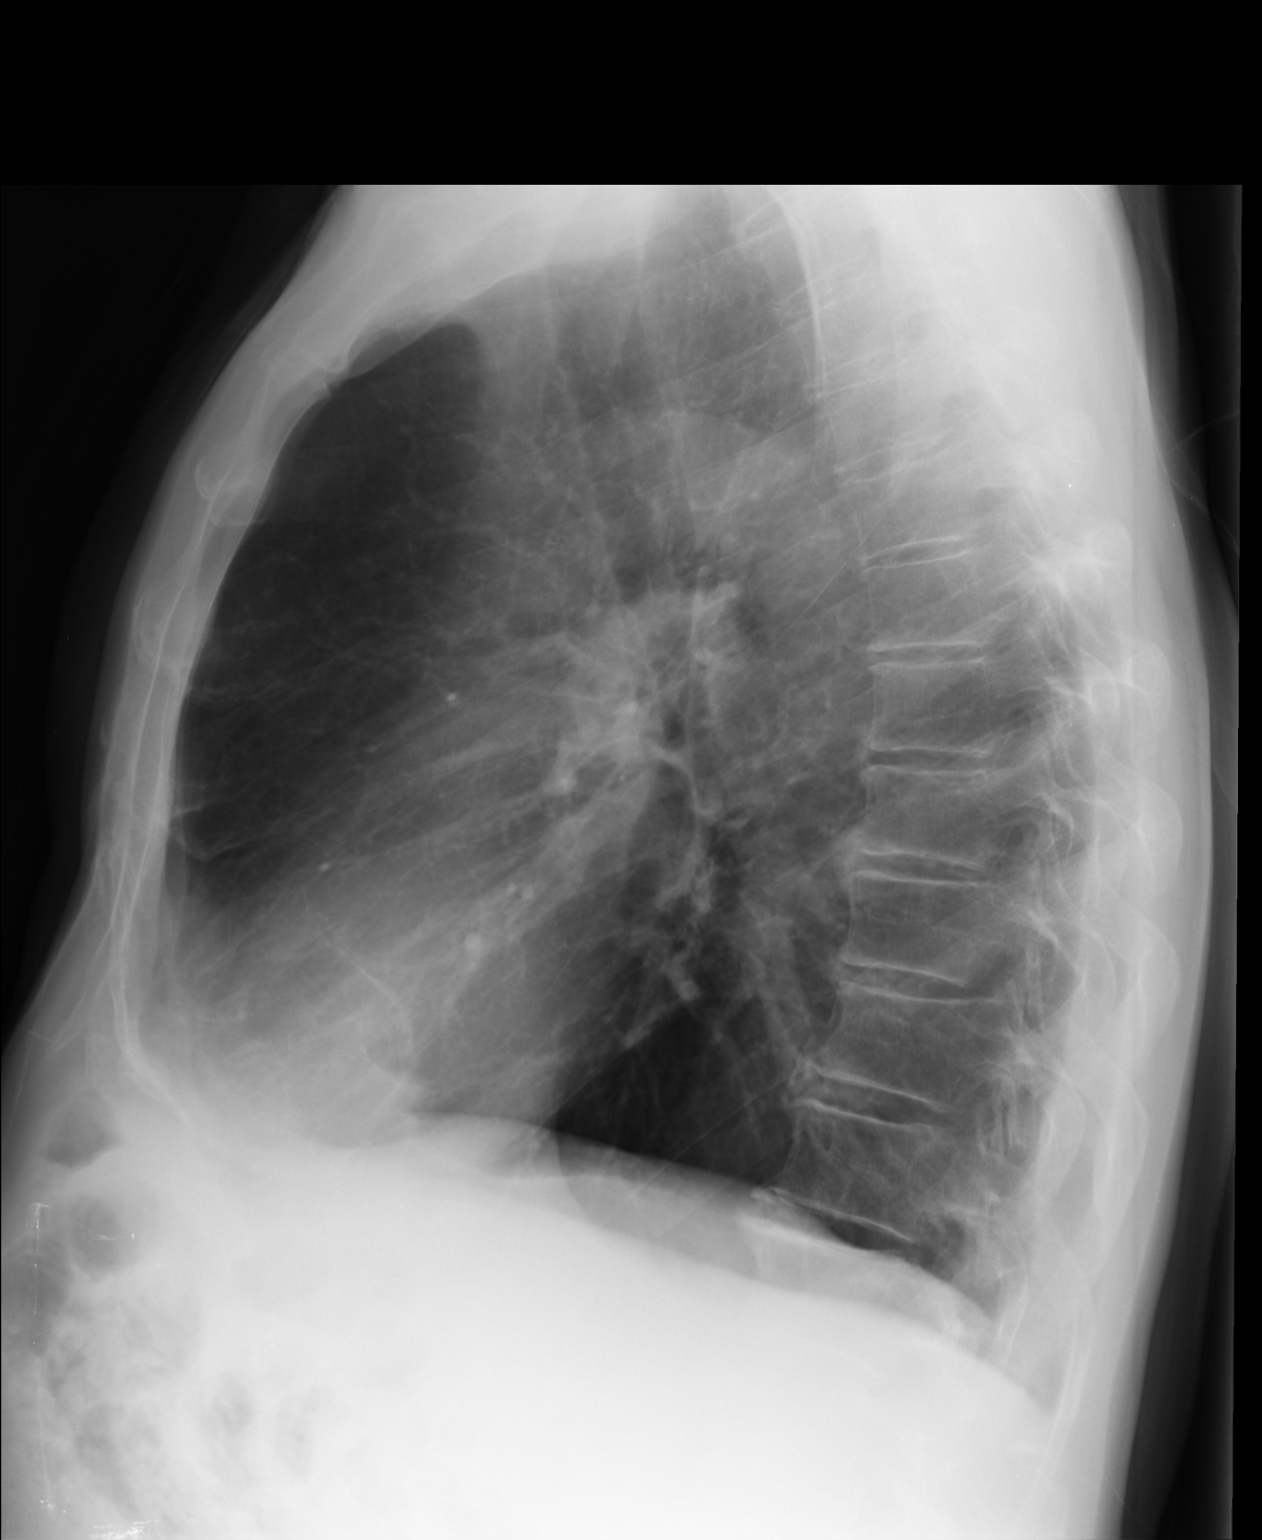

[PA (2 of 2)]
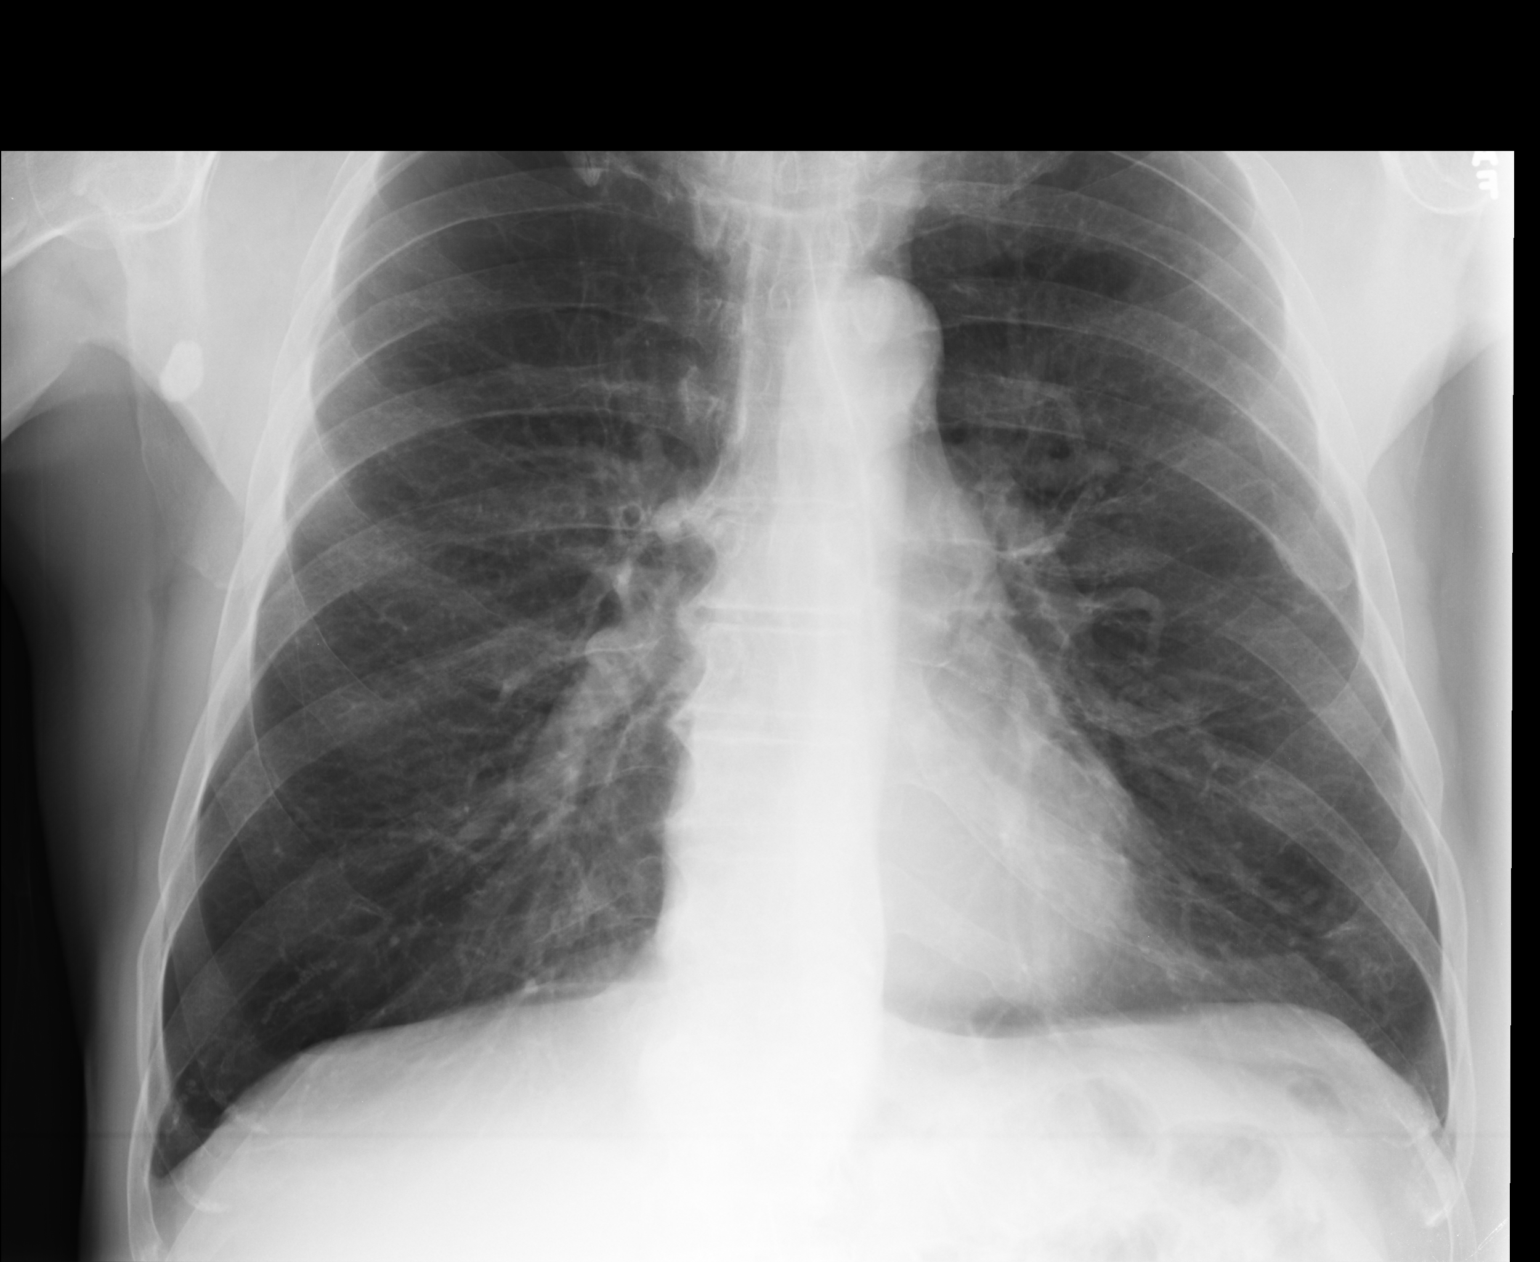

[3 of 3 positions shown; findings below may reference images not displayed]

FINDINGS: Stable cardiac and mediastinal contours. Emphysematous change. No
consolidative pulmonary opacities. No pleural effusion or
pneumothorax. Pulmonary hyperinflation. Mid thoracic spine
degenerative changes.
IMPRESSION: No acute cardiopulmonary process.

Emphysema and pulmonary hyperinflation.

## 2015-09-28 MED ORDER — PREDNISONE 20 MG PO TABS: ORAL_TABLET | ORAL | Status: DC

## 2015-09-28 MED ORDER — GI COCKTAIL ~~LOC~~: 30.0000 mL | Freq: Once | ORAL | Status: DC

## 2015-09-28 MED ORDER — LORAZEPAM 0.5 MG PO TABS: 0.5000 mg | ORAL_TABLET | Freq: Every day | ORAL | Status: DC | PRN

## 2015-09-28 NOTE — Progress Notes (Addendum)
Subjective:  This chart was scribed for [name], MD by [facility], medical scribe at [facility].The patient was seen in exam room 03 and the patient's care was started at 3:03 PM.   Patient ID: Leslie Sims, male    DOB: 11/13/1951, 64 y.o.   MRN: [id number]  09/28/2015  breathing and burning in chest and stomach  HPI HPI Comments: Leslie Sims is a 64 y.o. male with a hx of COPD who presents to [facility] complaining of a right sided constant burning sensation in his chest rated 3/10 and a dull soreness in his abdomen, onset one week ago. This pain travels into his right. Pain does not travel into his back. This does not worsen with eating or movement. No known injury. Movement does not worsen the pain. Recently around polyurethane and did no mask worn. 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 Also complains of a headache and fatigue. Complaining of rhinorrhea, congestion, post nasal drip. Mucous is a "dull" color. Increased cough producing a "dull" color. SOB with movement. Using his rescue inhaler everyday, also using his nebulizer several times in the day. Some wheezing. No fever, chills, sweats, sore throat, ear pain, nausea, vomiting, diarrhea and melena. Unsure of last stress test. He has been very stressed recently. Taking Zoloft twice day.  Smoking 2 ppd.     Review of Systems  Constitutional: Negative for fever, chills, diaphoresis, activity change, appetite change and fatigue.  HENT: Positive for congestion, postnasal drip and rhinorrhea.   Respiratory: Positive for cough and shortness of breath.   Cardiovascular: Positive for chest pain. Negative for palpitations and leg swelling.  Gastrointestinal: Positive for abdominal pain. Negative for nausea, vomiting and diarrhea.  Endocrine: Negative for cold intolerance, heat intolerance, polydipsia, polyphagia and polyuria.  Skin: Negative for color change,  rash and wound.  Neurological: Negative for dizziness, tremors, seizures, syncope, facial asymmetry, speech difficulty, weakness, light-headedness, numbness and headaches.  Psychiatric/Behavioral: Negative for sleep disturbance and dysphoric mood. The patient is nervous/anxious.     Past Medical History  Diagnosis Date  . Hyperlipidemia   . COPD (chronic obstructive pulmonary disease) ([place])   . Depression   . GERD (gastroesophageal reflux disease)   . Colon polyps 08/2012    colonoscopy; multiple colon polyps; repeat colonoscopy in one year.  . Status post dilation of esophageal narrowing 2000  . Peptic ulcer   . Hypertension   . CARDIOMYOPATHY 03/24/2010    did not tolerate Toprol and Lisinopril.   . Insomnia   . Hemorrhoids   . MI (myocardial infarction) ([place])   . Atherosclerosis   . Anxiety   . Headache(784.0)   . Tinea pedis   . Myalgia   . Helicobacter pylori (H. pylori)   . Thyroid dysfunction   . Chest pain   . Back pain   . Hernia     inguinal  . Allergic rhinitis   . Bronchitis     09-14-11  . Chronic airway obstruction, not elsewhere classified   . Coronary artery disease 11/2009    Anterior MI with late presentation 11/2009. Cath showed a 100% proximal LAD occlusion, 99% mid RCA. Had a PCI and 2 DES to both LAD and RCA. EF was 35%. Nuclear stress test 05/13: prior anterior/inferior infarcts without ischemic, EF 43%, cardiac cath in 02/14: Widely patent stents with no other obstructive disease. EF 40%    Past Surgical History  Procedure Laterality Date  . Penile prosthesis implant    . Esophagogastroduodenoscopy  2008  . Colonoscopy    . Esophageal dilation    . Hernia repair  07/20/2012    L inguinal hernia repair  . Admission  12/20/2012    COPD exacerbation.  [place].  . Esophagogastroduodenoscopy  08/20/2012  . Cardiac catheterization    . Coronary angioplasty with stent placement  09/2009    LAD 3.0 X23 mm Xience DES, RCA: 4.0 X 15 mm Xience DES  . Cardiac  catheterization  02/05/13    [facility]  . Cardiac catheterization  10/14    [facility] : patent stents with no change in anatomy. EF: 40$   Allergies  Allergen Reactions  . Wellbutrin [Bupropion] Other (See Comments)    Unknown/"made me feel real funny"   Social History   Social History  . Marital Status: Married    Spouse Name: N/A  . Number of Children: 5  . 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 Smoking status: Current Every Day Smoker -- 1.50 packs/day for 42 years    Types: Cigarettes  . Smokeless tobacco: Never Used  . Alcohol Use: No  . Drug Use: No  . Sexual Activity: Yes   Other Topics Concern  . Not on file   Social History Narrative   Marital status: married x 32 years; happily married.      Children: 4 children;  1 stepchild and 15 grandchildren; no great grandchildren.      Lives: with wife; 2 dogs, 1 cat.      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      Tobacco: 1 ppd x 45 years      Alcohol: none; previous alcoholism      Drugs: none      Exercise: no formal exercise; physically demanding job; owns horses.      Seatbelts:  100%      Guns: one loaded unsecured gun in the home.      Sunscreen: none        Objective:    BP 128/72 mmHg  Pulse 94  Temp(Src) 98.5 F (36.9 C) (Oral)  Resp 16  Ht 5\' 8"  (1.727 m)  Wt 156 lb (70.761 kg)  BMI 23.73 kg/m2  SpO2 95% Physical Exam  Constitutional: He is oriented to person, place, and time. He appears well-developed and well-nourished. No distress.  HENT:  Head: Normocephalic and atraumatic.  Right Ear: External ear normal.  Left Ear: External ear normal.  Nose: Nose normal.  Mouth/Throat: Oropharynx is clear and moist.  Eyes: Conjunctivae and EOM are normal. Pupils are equal, round, and reactive to light.  Neck: Normal range of motion. Neck supple. Carotid bruit is not present. No thyromegaly present.  Cardiovascular:  Normal rate, regular rhythm, normal heart sounds and intact distal pulses.  Exam reveals no gallop and no friction rub.   No murmur heard. Pulmonary/Chest: Effort normal and breath sounds normal. He has no wheezes. He has no rales.  TTP over the right intercostal junction and right upper sternal border.  Abdominal: Soft. Bowel sounds are normal. He exhibits no distension and no mass. There is no tenderness. There is no rebound and no guarding.  Lymphadenopathy:    He has no cervical adenopathy.  Neurological: He is alert and oriented to person, place, and time. No cranial nerve deficit.  Skin: Skin is warm  and dry. No rash noted. He is not diaphoretic.  Psychiatric: He has a normal mood and affect. His behavior is normal.  Nursing note and vitals reviewed.  Results for orders placed or performed in visit on 09/28/15  Comprehensive metabolic panel  Result Value Ref Range   Sodium 142 135 - 146 mmol/L   Potassium 4.8 3.5 - 5.3 mmol/L   Chloride 103 98 - 110 mmol/L   CO2 32 (H) 20 - 31 mmol/L   Glucose, Bld 89 65 - 99 mg/dL   BUN 9 7 - 25 mg/dL   Creat 0.81 0.70 - 1.25 mg/dL   Total Bilirubin 0.4 0.2 - 1.2 mg/dL   Alkaline Phosphatase 70 40 - 115 U/L   AST 13 10 - 35 U/L   ALT 13 9 - 46 U/L   Total Protein 6.1 6.1 - 8.1 g/dL   Albumin 4.3 3.6 - 5.1 g/dL   Calcium 9.5 8.6 - 10.3 mg/dL  POCT CBC  Result Value Ref Range   WBC 6.2 4.6 - 10.2 K/uL   Lymph, poc 1.2 0.6 - 3.4   POC LYMPH PERCENT 20.1 10 - 50 %L   MID (cbc) 0.4 0 - 0.9   POC MID % 6.8 0 - 12 %M   POC Granulocyte 4.5 2 - 6.9   Granulocyte percent 73.1 37 - 80 %G   RBC 5.12 4.69 - 6.13 M/uL   Hemoglobin 14.3 14.1 - 18.1 g/dL   HCT, POC 45.9 43.5 - 53.7 %   MCV 89.7 80 - 97 fL   MCH, POC 27.9 27 - 31.2 pg   MCHC 31.1 (A) 31.8 - 35.4 g/dL   RDW, POC 15.2 %   Platelet Count, POC 239 142 - 424 K/uL   MPV 6.2 0 - 99.8 fL      [facility] reading (PRIMARY) by  Dr. Tamala Julian. CXR: NAD; hyperinflation.  EKG: NSR; no acute process;  chronic ST changes.    GI COCKTAIL ADMINISTERED DURING VISIT. Assessment & Plan:   1. Burning chest pain   2. COPD exacerbation ([place])   3. Dyspnea   4. Atherosclerosis of native coronary artery of native heart without angina pectoris   5. DEPRESSION/ANXIETY    -New. -Improvement with GI cocktail. -Rx for Prednisone provided. -Refill of Ativan also provided due to increased stressors.   Meds ordered this encounter  Medications  . DISCONTD: predniSONE (DELTASONE) 20 MG tablet    Sig: 3 tabs po qd for 2 days, then 2 tabs po qd for 5 days, then 1 tab po qd for 5 days    Dispense:  21 tablet    Refill:  0  . LORazepam (ATIVAN) 0.5 MG tablet    Sig: Take 1 tablet (0.5 mg total) by mouth daily as needed for anxiety.    Dispense:  30 tablet    Refill:  3  . gi cocktail (Maalox,Lidocaine,Donnatal)    Sig:   . predniSONE (DELTASONE) 20 MG tablet    Sig: 3 tabs po qd for 2 days, then 2 tabs po qd for 5 days, then 1 tab po qd for 5 days    Dispense:  21 tablet    Refill:  0    No Follow-up on file.  By signing my name below, I, Nadim Abuhashem, attest that this documentation has been prepared under the direction and in the presence of [name], MD.  Electronically Signed: Lora Havens, medical scribe. 10/20/2015, 3:43 PM.   I personally performed the  services described in this documentation, which was scribed in my presence. The recorded information has been reviewed and considered.  Jakeisha Stricker Elayne Guerin, M.D. [facility] [address] [place], [place]  [address] [phone] phone [phone] fax

## 2015-09-29 LAB — COMPREHENSIVE METABOLIC PANEL
ALBUMIN: 4.3 g/dL (ref 3.6–5.1)
ALT: 13 U/L (ref 9–46)
AST: 13 U/L (ref 10–35)
Alkaline Phosphatase: 70 U/L (ref 40–115)
BILIRUBIN TOTAL: 0.4 mg/dL (ref 0.2–1.2)
BUN: 9 mg/dL (ref 7–25)
CALCIUM: 9.5 mg/dL (ref 8.6–10.3)
CO2: 32 mmol/L — AB (ref 20–31)
Chloride: 103 mmol/L (ref 98–110)
Creat: 0.81 mg/dL (ref 0.70–1.25)
Glucose, Bld: 89 mg/dL (ref 65–99)
Potassium: 4.8 mmol/L (ref 3.5–5.3)
Sodium: 142 mmol/L (ref 135–146)
Total Protein: 6.1 g/dL (ref 6.1–8.1)

## 2015-10-06 ENCOUNTER — Encounter: Payer: Self-pay | Admitting: Gastroenterology

## 2015-10-13 ENCOUNTER — Ambulatory Visit: Payer: BLUE CROSS/BLUE SHIELD | Admitting: Family Medicine

## 2015-10-14 ENCOUNTER — Other Ambulatory Visit: Payer: Self-pay | Admitting: Family Medicine

## 2015-10-16 NOTE — Telephone Encounter (Signed)
Dr Tamala Julian, you saw this pt this month, but don't see this exact med Rxd before. Similar meds have been Rxd in past. Do you want to Rx?

## 2015-10-20 ENCOUNTER — Encounter: Payer: Self-pay | Admitting: Family Medicine

## 2015-10-20 ENCOUNTER — Ambulatory Visit (INDEPENDENT_AMBULATORY_CARE_PROVIDER_SITE_OTHER): Payer: BLUE CROSS/BLUE SHIELD | Admitting: Family Medicine

## 2015-10-20 VITALS — BP 114/70 | HR 93 | Temp 97.8°F | Resp 16 | Ht 68.5 in | Wt 154.4 lb

## 2015-10-20 DIAGNOSIS — J449 Chronic obstructive pulmonary disease, unspecified: Secondary | ICD-10-CM | POA: Diagnosis not present

## 2015-10-20 DIAGNOSIS — Z1159 Encounter for screening for other viral diseases: Secondary | ICD-10-CM | POA: Diagnosis not present

## 2015-10-20 DIAGNOSIS — K219 Gastro-esophageal reflux disease without esophagitis: Secondary | ICD-10-CM

## 2015-10-20 DIAGNOSIS — Z114 Encounter for screening for human immunodeficiency virus [HIV]: Secondary | ICD-10-CM | POA: Diagnosis not present

## 2015-10-20 DIAGNOSIS — J3089 Other allergic rhinitis: Secondary | ICD-10-CM

## 2015-10-20 DIAGNOSIS — R918 Other nonspecific abnormal finding of lung field: Secondary | ICD-10-CM

## 2015-10-20 DIAGNOSIS — I10 Essential (primary) hypertension: Secondary | ICD-10-CM | POA: Diagnosis not present

## 2015-10-20 DIAGNOSIS — J309 Allergic rhinitis, unspecified: Secondary | ICD-10-CM

## 2015-10-20 DIAGNOSIS — R7302 Impaired glucose tolerance (oral): Secondary | ICD-10-CM

## 2015-10-20 DIAGNOSIS — J3081 Allergic rhinitis due to animal (cat) (dog) hair and dander: Secondary | ICD-10-CM

## 2015-10-20 DIAGNOSIS — E785 Hyperlipidemia, unspecified: Secondary | ICD-10-CM

## 2015-10-20 DIAGNOSIS — F341 Dysthymic disorder: Secondary | ICD-10-CM | POA: Diagnosis not present

## 2015-10-20 DIAGNOSIS — J302 Other seasonal allergic rhinitis: Secondary | ICD-10-CM

## 2015-10-20 LAB — COMPREHENSIVE METABOLIC PANEL
ALBUMIN: 4.3 g/dL (ref 3.6–5.1)
ALT: 12 U/L (ref 9–46)
AST: 14 U/L (ref 10–35)
Alkaline Phosphatase: 65 U/L (ref 40–115)
BILIRUBIN TOTAL: 0.6 mg/dL (ref 0.2–1.2)
BUN: 10 mg/dL (ref 7–25)
CHLORIDE: 102 mmol/L (ref 98–110)
CO2: 31 mmol/L (ref 20–31)
CREATININE: 0.82 mg/dL (ref 0.70–1.25)
Calcium: 9.3 mg/dL (ref 8.6–10.3)
Glucose, Bld: 89 mg/dL (ref 65–99)
Potassium: 4.3 mmol/L (ref 3.5–5.3)
SODIUM: 141 mmol/L (ref 135–146)
TOTAL PROTEIN: 6.2 g/dL (ref 6.1–8.1)

## 2015-10-20 LAB — CBC WITH DIFFERENTIAL/PLATELET
BASOS PCT: 0 % (ref 0–1)
Basophils Absolute: 0 10*3/uL (ref 0.0–0.1)
Eosinophils Absolute: 0.1 10*3/uL (ref 0.0–0.7)
Eosinophils Relative: 1 % (ref 0–5)
HCT: 43 % (ref 39.0–52.0)
HEMOGLOBIN: 14.7 g/dL (ref 13.0–17.0)
Lymphocytes Relative: 23 % (ref 12–46)
Lymphs Abs: 1.5 10*3/uL (ref 0.7–4.0)
MCH: 29.3 pg (ref 26.0–34.0)
MCHC: 34.2 g/dL (ref 30.0–36.0)
MCV: 85.8 fL (ref 78.0–100.0)
MPV: 9.8 fL (ref 8.6–12.4)
Monocytes Absolute: 0.7 10*3/uL (ref 0.1–1.0)
Monocytes Relative: 11 % (ref 3–12)
NEUTROS ABS: 4.2 10*3/uL (ref 1.7–7.7)
NEUTROS PCT: 65 % (ref 43–77)
Platelets: 189 10*3/uL (ref 150–400)
RBC: 5.01 MIL/uL (ref 4.22–5.81)
RDW: 13.6 % (ref 11.5–15.5)
WBC: 6.5 10*3/uL (ref 4.0–10.5)

## 2015-10-20 LAB — LIPID PANEL
Cholesterol: 165 mg/dL (ref 125–200)
HDL: 54 mg/dL (ref >=40)
LDL Cholesterol: 90 mg/dL (ref <130)
Total CHOL/HDL Ratio: 3.1 Ratio (ref <=5.0)
Triglycerides: 103 mg/dL (ref <150)
VLDL: 21 mg/dL (ref <30)

## 2015-10-20 LAB — HEMOGLOBIN A1C
HEMOGLOBIN A1C: 6.5 % — AB (ref <5.7)
MEAN PLASMA GLUCOSE: 140 mg/dL — AB (ref <117)

## 2015-10-20 NOTE — Progress Notes (Signed)
Subjective:    Patient ID: Leslie Sims, male    DOB: June 28, 1951, 64 y.o.   MRN: [id number]  10/20/2015  Follow-up and Medication Refill   HPI This 64 y.o. male presents for evaluation of  1. Hyperlipidemia:  Patient reports good compliance with medication, good tolerance to medication, and good symptom control.    2. Glucose intolerance: stable; due for labs.  3.  CAD:  Patient reports good compliance with medication, good tolerance to medication, and good symptom control.   Plavix daily.  ASA daily.  Losartan 25mg  daily.  No need for NTG.    4. COPD:  Patient reports good compliance with medication, good tolerance to medication, and good symptom control.  Advair bid, Spiriva daily, nebulizer tid.  Concerned regarding recurrent infections and congestion.  Non-compliance with previous allergy consultation; now agreeable.  With work, exposed to dust, chemicals, animals.   5. GERD: Patient reports good compliance with medication, good tolerance to medication, and good symptom control.   Nexium generic once daily.  Carafate twice monthly on average.  6.  Hemorrhoids:  Bleeding; refill of Cortiform foam requested.  7. Tobacco abuse: could not tolerate Chantix.  Took for two weeks; could not tolerate.  8.  Anxiety and depression:  Patient reports good compliance with medication, good tolerance to medication, and good symptom control.  Sertraline 100mg  two daily.  Lorazepam once daily recently in midday section.      Review of Systems  Constitutional: Negative for fever, chills, diaphoresis, activity change, appetite change and fatigue.  Respiratory: Negative for cough and shortness of breath.   Cardiovascular: Negative for chest pain, palpitations and leg swelling.  Gastrointestinal: Negative for nausea, vomiting, abdominal pain and diarrhea.  Endocrine: Negative for cold intolerance, heat intolerance, polydipsia, polyphagia and polyuria.  Skin: Negative for color change,  rash and wound.  Neurological: Negative for dizziness, tremors, seizures, syncope, facial asymmetry, speech difficulty, weakness, light-headedness, numbness and headaches.  Psychiatric/Behavioral: Negative for sleep disturbance and dysphoric mood. The patient is not nervous/anxious.     Past Medical History  Diagnosis Date  . Hyperlipidemia   . COPD (chronic obstructive pulmonary disease) ([place])   . Depression   . GERD (gastroesophageal reflux disease)   . Colon polyps 08/2012    colonoscopy; multiple colon polyps; repeat colonoscopy in one year.  . Status post dilation of esophageal narrowing 2000  . Peptic ulcer   . Hypertension   . CARDIOMYOPATHY 03/24/2010    did not tolerate Toprol and Lisinopril.   . Insomnia   . Hemorrhoids   . MI (myocardial infarction) ([place])   . Atherosclerosis   . Anxiety   . Headache(784.0)   . Tinea pedis   . Myalgia   . Helicobacter pylori (H. pylori)   . Thyroid dysfunction   . Chest pain   . Back pain   . Hernia     inguinal  . Allergic rhinitis   . Bronchitis     09-14-11  . Chronic airway obstruction, not elsewhere classified   . Coronary artery disease 11/2009    Anterior MI with late presentation 11/2009. Cath showed a 100% proximal LAD occlusion, 99% mid RCA. Had a PCI and 2 DES to both LAD and RCA. EF was 35%. Nuclear stress test 05/13: prior anterior/inferior infarcts without ischemic, EF 43%, cardiac cath in 02/14: Widely patent stents with no other obstructive disease. EF 40%    Past Surgical History  Procedure Laterality Date  . Penile prosthesis implant    .  Esophagogastroduodenoscopy  2008  . Colonoscopy    . Esophageal dilation    . Hernia repair  07/20/2012    L inguinal hernia repair  . Admission  12/20/2012    COPD exacerbation.  [place].  . Esophagogastroduodenoscopy  08/20/2012  . Cardiac catheterization    . Coronary angioplasty with stent placement  09/2009    LAD 3.0 X23 mm Xience DES, RCA: 4.0 X 15 mm Xience DES  . Cardiac  catheterization  02/05/13    [facility]  . Cardiac catheterization  10/14    [facility] : patent stents with no change in anatomy. EF: 40$   Allergies  Allergen Reactions  . Wellbutrin [Bupropion] Other (See Comments)    Unknown/"made me feel real funny"   Current Outpatient Prescriptions  Medication Sig Dispense Refill  . albuterol (PROVENTIL) (2.5 MG/3ML) 0.083% nebulizer solution USE 1 VIAL VIA NEBULIZER EVERY 6 HOURS AS NEEDED FOR WHEEZING OR SHORTNESS OF BREATH 360 vial 0  . albuterol (VENTOLIN HFA) 108 (90 BASE) MCG/ACT inhaler Inhale 2 puffs into the lungs every 6 (six) hours as needed. For wheezing 54 g 11  . aspirin 81 MG tablet Take 81 mg by mouth daily.      [name] atorvastatin (LIPITOR) 20 MG tablet TAKE 1 TABLET DAILY 90 tablet 3  . clopidogrel (PLAVIX) 75 MG tablet Take 1 tablet (75 mg total) by mouth daily. 90 tablet 3  . CORTIFOAM 10 % rectal foam PLACE ONE APPLICATION RECTALLY TWICE DAILY 15 g 5  . esomeprazole (NEXIUM) 40 MG capsule Take 1 capsule (40 mg total) by mouth 2 (two) times daily before a meal. 180 capsule 3  . Fluticasone-Salmeterol (ADVAIR DISKUS) 500-50 MCG/DOSE AEPB Inhale 2 puffs into the lungs daily. 3 each 3  . LORazepam (ATIVAN) 0.5 MG tablet Take 1 tablet (0.5 mg total) by mouth daily as needed for anxiety. 30 tablet 3  . losartan (COZAAR) 25 MG tablet Take 1 tablet (25 mg total) by mouth daily. 90 tablet 3  . nitroGLYCERIN (NITROSTAT) 0.4 MG SL tablet Place 1 tablet (0.4 mg total) under the tongue every 5 (five) minutes as needed. 25 tablet 3  . sertraline (ZOLOFT) 100 MG tablet Take 1-2 tablets (100-200 mg total) by mouth daily. 180 tablet 3  . sucralfate (CARAFATE) 1 GM/10ML suspension TAKE 10ML( 2 TEASPOONSFUL) BY MOUTH FOUR TIMES DAILY 3600 mL 1  . predniSONE (DELTASONE) 20 MG tablet 3 tabs po qd for 2 days, then 2 tabs po qd for 5 days, then 1 tab po qd for 5 days (Patient not taking: Reported on 10/20/2015) 21 tablet 0  . varenicline (CHANTIX CONTINUING MONTH PAK)  1 MG tablet Take 1 tablet (1 mg total) by mouth 2 (two) times daily. (Patient not taking: Reported on 09/28/2015) 60 tablet 2  . varenicline (CHANTIX STARTING MONTH PAK) 0.5 MG X 11 & 1 MG X 42 tablet Take one 0.5 mg tablet by mouth once daily for 3 days, then increase to one 0.5 mg tablet twice daily for 4 days, then increase to one 1 mg tablet twice daily. (Patient not taking: Reported on 09/28/2015) 53 tablet 0   Current Facility-Administered Medications  Medication Dose Route Frequency Provider Last Rate Last Dose  . albuterol (PROVENTIL) (2.5 MG/3ML) 0.083% nebulizer solution 2.5 mg  2.5 mg Nebulization Once [name], MD      . gi cocktail (Maalox,Lidocaine,Donnatal)  30 mL Oral Once [name], MD       Social History   Social  History  . Marital Status: Married    Spouse Name: N/A  . Number of Children: 5  . 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 Smoking status: Current Every Day Smoker -- 1.50 packs/day for 42 years    Types: Cigarettes  . Smokeless tobacco: Never Used  . Alcohol Use: No  . Drug Use: No  . Sexual Activity: Yes   Other Topics Concern  . Not on file   Social History Narrative   Marital status: married x 32 years; happily married.      Children: 4 children;  1 stepchild and 15 grandchildren; no great grandchildren.      Lives: with wife; 2 dogs, 1 cat.      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      Tobacco: 1 ppd x 45 years      Alcohol: none; previous alcoholism      Drugs: none      Exercise: no formal exercise; physically demanding job; owns horses.      Seatbelts:  100%      Guns: one loaded unsecured gun in the home.      Sunscreen: none   Family History  Problem Relation Age of Onset  . Heart attack Brother     Brother #1  . Diabetes Brother     brother #2  . Hypertension Brother     #3  . Coronary artery disease Father 48    deceased  .  Heart attack Father   . Diabetes Father   . Heart disease Father   . COPD Mother 23    deceased  . Heart attack Sister 30    acute-cocaine abuse/deceased  . Alcohol abuse Sister     polysubstance abuse  . COPD Sister   . Alcohol abuse Sister     polysubstance abuse  . Heart disease Brother     AMI x multiple       Objective:    BP 114/70 mmHg  Pulse 93  Temp(Src) 97.8 F (36.6 C) (Oral)  Resp 16  Ht 5' 8.5" (1.74 m)  Wt 154 lb 6.4 oz (70.035 kg)  BMI 23.13 kg/m2 Physical Exam  Constitutional: He is oriented to person, place, and time. He appears well-developed and well-nourished. No distress.  HENT:  Head: Normocephalic and atraumatic.  Right Ear: External ear normal.  Left Ear: External ear normal.  Nose: Nose normal.  Mouth/Throat: Oropharynx is clear and moist.  Eyes: Conjunctivae and EOM are normal. Pupils are equal, round, and reactive to light.  Neck: Normal range of motion. Neck supple. Carotid bruit is not present. No thyromegaly present.  Cardiovascular: Normal rate, regular rhythm, normal heart sounds and intact distal pulses.  Exam reveals no gallop and no friction rub.   No murmur heard. Pulmonary/Chest: Effort normal and breath sounds normal. He has no wheezes. He has no rales.  Distant breath sounds throughout.  Abdominal: Soft. Bowel sounds are normal. He exhibits no distension and no mass. There is no tenderness. There is no rebound and no guarding.  Lymphadenopathy:    He has no cervical adenopathy.  Neurological: He is alert and oriented to person, place, and time. No cranial nerve deficit.  Skin: Skin is warm and dry. No rash noted. He is not diaphoretic.  Psychiatric: He has a normal mood and affect. His behavior is normal.  Nursing note and vitals reviewed.  Results for orders placed or performed in visit on 09/28/15  Comprehensive metabolic panel  Result Value Ref Range   Sodium 142 135 - 146 mmol/L   Potassium 4.8 3.5 - 5.3 mmol/L    Chloride 103 98 - 110 mmol/L   CO2 32 (H) 20 - 31 mmol/L   Glucose, Bld 89 65 - 99 mg/dL   BUN 9 7 - 25 mg/dL   Creat 0.81 0.70 - 1.25 mg/dL   Total Bilirubin 0.4 0.2 - 1.2 mg/dL   Alkaline Phosphatase 70 40 - 115 U/L   AST 13 10 - 35 U/L   ALT 13 9 - 46 U/L   Total Protein 6.1 6.1 - 8.1 g/dL   Albumin 4.3 3.6 - 5.1 g/dL   Calcium 9.5 8.6 - 10.3 mg/dL  POCT CBC  Result Value Ref Range   WBC 6.2 4.6 - 10.2 K/uL   Lymph, poc 1.2 0.6 - 3.4   POC LYMPH PERCENT 20.1 10 - 50 %L   MID (cbc) 0.4 0 - 0.9   POC MID % 6.8 0 - 12 %M   POC Granulocyte 4.5 2 - 6.9   Granulocyte percent 73.1 37 - 80 %G   RBC 5.12 4.69 - 6.13 M/uL   Hemoglobin 14.3 14.1 - 18.1 g/dL   HCT, POC 45.9 43.5 - 53.7 %   MCV 89.7 80 - 97 fL   MCH, POC 27.9 27 - 31.2 pg   MCHC 31.1 (A) 31.8 - 35.4 g/dL   RDW, POC 15.2 %   Platelet Count, POC 239 142 - 424 K/uL   MPV 6.2 0 - 99.8 fL   INFLUENZA VACCINE ADMINISTERED.    Assessment & Plan:   1. Hyperlipidemia   2. Glucose intolerance (impaired glucose tolerance)   3. Need for hepatitis C screening test   4. Screening for HIV (human immunodeficiency virus)   5. Seasonal and perennial allergic rhinitis   6. Pulmonary nodules   7. Essential hypertension   8. Gastroesophageal reflux disease without esophagitis   9. DEPRESSION/ANXIETY   10. COPD, very severe ([place])   11. Allergic rhinitis due to animal hair and dander    -refer for allergy testing with allergy/immunology. -Obtain labs.  -No changes to management at this time.  Orders Placed This Encounter  Procedures  . CBC with Differential/Platelet  . Comprehensive metabolic panel    Order Specific Question:  Has the patient fasted?    Answer:  Yes  . Hemoglobin A1c  . Lipid panel    Order Specific Question:  Has the patient fasted?    Answer:  Yes  . Hepatitis C antibody  . HIV antibody  . Ambulatory referral to Allergy    Referral Priority:  Routine    Referral Type:  Allergy Testing    Referral  Reason:  Specialty Services Required    Requested Specialty:  Allergy    Number of Visits Requested:  1   No orders of the defined types were placed in this encounter.    Return in about 4 months (around 02/17/2016) for recheck.    Omah Dewalt Elayne Guerin, M.D. [facility] [address] [place], [place]  [address] [phone] phone [phone] fax

## 2015-10-21 LAB — HIV ANTIBODY (ROUTINE TESTING W REFLEX): HIV: NONREACTIVE

## 2015-10-21 LAB — HEPATITIS C ANTIBODY: HCV AB: NEGATIVE

## 2015-10-26 MED ORDER — METFORMIN HCL 500 MG PO TABS: 500.0000 mg | ORAL_TABLET | Freq: Every day | ORAL | Status: DC

## 2015-10-26 NOTE — Addendum Note (Signed)
Addended by: [name] on: 10/26/2015 10:01 PM   Modules accepted: Orders

## 2015-11-03 ENCOUNTER — Encounter: Payer: Self-pay | Admitting: *Deleted

## 2015-11-03 ENCOUNTER — Ambulatory Visit
Admission: EM | Admit: 2015-11-03 | Discharge: 2015-11-03 | Disposition: A | Discharge status: Home / Self Care | Payer: BLUE CROSS/BLUE SHIELD | Attending: Family Medicine | Admitting: Family Medicine

## 2015-11-03 DIAGNOSIS — J441 Chronic obstructive pulmonary disease with (acute) exacerbation: Secondary | ICD-10-CM

## 2015-11-03 DIAGNOSIS — J40 Bronchitis, not specified as acute or chronic: Secondary | ICD-10-CM | POA: Diagnosis not present

## 2015-11-03 DIAGNOSIS — J209 Acute bronchitis, unspecified: Secondary | ICD-10-CM

## 2015-11-03 MED ORDER — LEVOFLOXACIN 500 MG PO TABS: 500.0000 mg | ORAL_TABLET | Freq: Every day | ORAL | Status: DC

## 2015-11-03 MED ORDER — HYDROCOD POLST-CPM POLST ER 10-8 MG/5ML PO SUER: 5.0000 mL | Freq: Two times a day (BID) | ORAL | Status: DC | PRN

## 2015-11-03 MED ORDER — IPRATROPIUM-ALBUTEROL 0.5-2.5 (3) MG/3ML IN SOLN
3.0000 mL | Freq: Once | RESPIRATORY_TRACT | Status: AC
  Administered 2015-11-03: 3 mL via RESPIRATORY_TRACT

## 2015-11-03 MED ORDER — PREDNISONE 10 MG (21) PO TBPK: ORAL_TABLET | ORAL | Status: DC

## 2015-11-03 NOTE — ED Provider Notes (Signed)
CSN: [id number]     Arrival date & time 11/03/15  1915 History   First MD Initiated Contact with Patient 11/03/15 1925    Nurse's notes were reviewed Chief Complaint  Patient presents with  . Nasal Congestion  . Shortness of Breath     Patient 64 year old white male with history of COPD and recurrent exacerbation. States he has 45 episodes a year. He smokes he feels and he states that they felt that maybe something else exacerbating his COPD illness. 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 He is supposed to have allergy testing this Thursday and was told not to take any type of antihistamines to have a successful test. He states that everything started about 4 days ago. And he still smokes he did take a nebulizer albuterol treatment before he came here but he still feels short of breath. (Consider location/radiation/quality/duration/timing/severity/associated sxs/prior Treatment) Patient is a 64 y.o. male presenting with shortness of breath. The history is provided by the patient. No language interpreter was used.  Shortness of Breath Severity:  Moderate Onset quality:  Gradual Duration:  4 days Timing:  Constant Progression:  Worsening Chronicity:  New Context: activity, fumes, occupational exposure, smoke exposure, strong odors and URI   Context: not animal exposure and not weather changes   Relieved by:  Nothing Ineffective treatments:  None tried Associated symptoms: sputum production, swollen glands and wheezing   Associated symptoms: no chest pain, no fever, no headaches and no sore throat   Risk factors: tobacco use   Risk factors: no recent alcohol use, no family hx of DVT and no hx of cancer     Past Medical History  Diagnosis Date  . Hyperlipidemia   . COPD (chronic obstructive pulmonary disease) ([place])   . Depression   . GERD (gastroesophageal reflux disease)   . Colon polyps 08/2012     colonoscopy; multiple colon polyps; repeat colonoscopy in one year.  . Status post dilation of esophageal narrowing 2000  . Peptic ulcer   . Hypertension   . CARDIOMYOPATHY 03/24/2010    did not tolerate Toprol and Lisinopril.   . Insomnia   . Hemorrhoids   . MI (myocardial infarction) ([place])   . Atherosclerosis   . Anxiety   . Headache(784.0)   . Tinea pedis   . Myalgia   . Helicobacter pylori (H. pylori)   . Thyroid dysfunction   . Chest pain   . Back pain   . Hernia     inguinal  . Allergic rhinitis   . Bronchitis     09-14-11  . Chronic airway obstruction, not elsewhere classified   . Coronary artery disease 11/2009    Anterior MI with late presentation 11/2009. Cath showed a 100% proximal LAD occlusion, 99% mid RCA. Had a PCI and 2 DES to both LAD and RCA. EF was 35%. Nuclear stress test 05/13: prior anterior/inferior infarcts without ischemic, EF 43%, cardiac cath in 02/14: Widely patent stents with no other obstructive disease. EF 40%    Past Surgical History  Procedure Laterality Date  . Penile prosthesis implant    . Esophagogastroduodenoscopy  2008  . Colonoscopy    . Esophageal dilation    . Hernia repair  07/20/2012    L inguinal hernia repair  . Admission  12/20/2012    COPD exacerbation.  [place].  . Esophagogastroduodenoscopy  08/20/2012  . Cardiac catheterization    .  Coronary angioplasty with stent placement  09/2009    LAD 3.0 X23 mm Xience DES, RCA: 4.0 X 15 mm Xience DES  . Cardiac catheterization  02/05/13    [facility]  . Cardiac catheterization  10/14    [facility] : patent stents with no change in anatomy. EF: 40$   Family History  Problem Relation Age of Onset  . Heart attack Brother     Brother #1  . Diabetes Brother     brother #2  . Hypertension Brother     #3  . Coronary artery disease Father 57    deceased  . Heart attack Father   . Diabetes Father   . Heart disease Father   . COPD Mother 77    deceased  . Heart attack Sister 32    acute-cocaine  abuse/deceased  . Alcohol abuse Sister     polysubstance abuse  . COPD Sister   . Alcohol abuse Sister     polysubstance abuse  . Heart disease Brother     AMI x multiple   Social History  Substance Use Topics  . Smoking status: Current Every Day Smoker -- 1.50 packs/day for 42 years    Types: Cigarettes  . Smokeless tobacco: Never Used  . Alcohol Use: No    Review of Systems  Constitutional: Negative for fever.  HENT: Negative for sore throat.   Respiratory: Positive for sputum production, shortness of breath and wheezing.   Cardiovascular: Negative for chest pain.  Neurological: Negative for headaches.  All other systems reviewed and are negative.   Allergies  Wellbutrin  Home Medications   Prior to Admission medications   Medication Sig Start Date End Date Taking? Authorizing Provider  albuterol (PROVENTIL) (2.5 MG/3ML) 0.083% nebulizer solution USE 1 VIAL VIA NEBULIZER EVERY 6 HOURS AS NEEDED FOR WHEEZING OR SHORTNESS OF BREATH 08/15/15   [name], MD  albuterol (VENTOLIN HFA) 108 (90 BASE) MCG/ACT inhaler Inhale 2 puffs into the lungs every 6 (six) hours as needed. For wheezing 11/07/14   [name], MD  aspirin 81 MG tablet Take 81 mg by mouth daily.      Historical Provider, MD  atorvastatin (LIPITOR) 20 MG tablet TAKE 1 TABLET DAILY 05/08/15   Wellington Hampshire, MD  chlorpheniramine-HYDROcodone (TUSSIONEX PENNKINETIC ER) 10-8 MG/5ML SUER Take 5 mLs by mouth every 12 (twelve) hours as needed for cough. 11/03/15   Frederich Cha, MD  clopidogrel (PLAVIX) 75 MG tablet Take 1 tablet (75 mg total) by mouth daily. 01/31/15   Wellington Hampshire, MD  CORTIFOAM 10 % rectal foam PLACE ONE APPLICATION RECTALLY TWICE DAILY 10/17/15   [name], MD  esomeprazole (NEXIUM) 40 MG capsule Take 1 capsule (40 mg total) by mouth 2 (two) times daily before a meal. 06/13/15   [name], MD  Fluticasone-Salmeterol (ADVAIR DISKUS) 500-50 MCG/DOSE AEPB Inhale 2 puffs into the lungs  daily. 11/07/14   [name], MD  levofloxacin (LEVAQUIN) 500 MG tablet Take 1 tablet (500 mg total) by mouth daily. 11/03/15   Frederich Cha, MD  LORazepam (ATIVAN) 0.5 MG tablet Take 1 tablet (0.5 mg total) by mouth daily as needed for anxiety. 09/28/15   [name], MD  losartan (COZAAR) 25 MG tablet Take 1 tablet (25 mg total) by mouth daily. 01/31/15   Wellington Hampshire, MD  metFORMIN (GLUCOPHAGE) 500 MG tablet Take 1 tablet (500 mg total) by mouth daily after supper. 10/26/15   [name],  MD  nitroGLYCERIN (NITROSTAT) 0.4 MG SL tablet Place 1 tablet (0.4 mg total) under the tongue every 5 (five) minutes as needed. 03/13/15   Minna Merritts, MD  predniSONE (DELTASONE) 20 MG tablet 3 tabs po qd for 2 days, then 2 tabs po qd for 5 days, then 1 tab po qd for 5 days Patient not taking: Reported on 10/20/2015 09/28/15   [name], MD  predniSONE (STERAPRED UNI-PAK 21 TAB) 10 MG (21) TBPK tablet Sig 6 tablet day 1, 5 tablets day 2, 4 tablets day 3,,3tablets day 4, 2 tablets day 5, 1 tablet day 6 take all tablets orally Do not take until after allergy testing has been done on Thursday, 11/06/2015 11/03/15   Frederich Cha, MD  sertraline (ZOLOFT) 100 MG tablet Take 1-2 tablets (100-200 mg total) by mouth daily. 06/13/15   [name], MD  sucralfate (CARAFATE) 1 GM/10ML suspension TAKE 10ML( 2 TEASPOONSFUL) BY MOUTH FOUR TIMES DAILY 01/21/15   [name], MD  varenicline (CHANTIX CONTINUING MONTH PAK) 1 MG tablet Take 1 tablet (1 mg total) by mouth 2 (two) times daily. Patient not taking: Reported on 09/28/2015 06/13/15   [name], MD  varenicline (CHANTIX STARTING MONTH PAK) 0.5 MG X 11 & 1 MG X 42 tablet Take one 0.5 mg tablet by mouth once daily for 3 days, then increase to one 0.5 mg tablet twice daily for 4 days, then increase to one 1 mg tablet twice daily. Patient not taking: Reported on 09/28/2015 06/13/15   [name], MD   Meds Ordered and Administered this Visit    Medications  ipratropium-albuterol (DUONEB) 0.5-2.5 (3) MG/3ML nebulizer solution 3 mL (not administered)    BP 154/64 mmHg  Pulse 97  Temp(Src) 97.9 F (36.6 C) (Oral)  Ht 5\' 10"  (1.778 m)  Wt 158 lb (71.668 kg)  BMI 22.67 kg/m2  SpO2 98% No data found.   Physical Exam  Constitutional: He is oriented to person, place, and time. He appears well-developed and well-nourished.  Patient is obviously short of breath  HENT:  Head: Normocephalic and atraumatic.  Right Ear: External ear normal.  Left Ear: External ear normal.  Eyes: Pupils are equal, round, and reactive to light.  Neck: Normal range of motion. Neck supple. No tracheal deviation present.  Cardiovascular: Normal rate, regular rhythm and normal heart sounds.   Pulmonary/Chest: Effort normal. No respiratory distress. He has wheezes.  Musculoskeletal: Normal range of motion.  Lymphadenopathy:    He has cervical adenopathy.  Neurological: He is alert and oriented to person, place, and time.  Skin: Skin is warm and dry. No erythema.  Psychiatric: He has a normal mood and affect.  Vitals reviewed.   ED Course  Procedures (including critical care time)  Labs Review Labs Reviewed - No data to display  Imaging Review No results found.   Visual Acuity Review  Right Eye Distance:   Left Eye Distance:   Bilateral Distance:    Right Eye Near:   Left Eye Near:    Bilateral Near:         MDM   1. COPD with acute exacerbation ([place])   2. Bronchitis with bronchospasm     We'll treat patient with Tussionex 1 teaspoon twice a day when necessary for the cough. He requests ANABIOTIC for his COPD which we Levaquin 500 mg 1 tablet day for 10 days. And lastly is expected to normally we'll place him on prednisone since she's having  allergy testing on Thursday for his recurrence exacerbation of his COPD he cannot take the prednisone quite for antihistamine until he has been tested. If he gets worse he may need talk to  the allergist and postpone his allergy testing but that decision will have to depend on him and his allergist in the symptoms that he has.  He was given a DuoNeb treatment which improvement of his breathing. And once again stressed importance to stop smoking.     Frederich Cha, MD 11/03/15 2003

## 2015-11-03 NOTE — ED Notes (Signed)
Pt states that he has congestion and is short of breath, started about 1 week ago.

## 2015-11-03 NOTE — Discharge Instructions (Signed)
Asthma, Acute Bronchospasm °Acute bronchospasm caused by asthma is also referred to as an asthma attack. Bronchospasm means your air passages become narrowed. The narrowing is caused by inflammation and tightening of the muscles in the air tubes (bronchi) in your lungs. This can make it hard to breathe or cause you to wheeze and cough. °CAUSES °Possible triggers are: °· Animal dander from the skin, hair, or feathers of animals. °· Dust mites contained in house dust. °· Cockroaches. °· Pollen from trees or grass. °· Mold. °· Cigarette or tobacco smoke. °· Air pollutants such as dust, household cleaners, hair sprays, aerosol sprays, paint fumes, strong chemicals, or strong odors. °· Cold air or weather changes. Cold air may trigger inflammation. Winds increase molds and pollens in the air. °· Strong emotions such as crying or laughing hard. °· Stress. °· Certain medicines such as aspirin or beta-blockers. °· Sulfites in foods and drinks, such as dried fruits and wine. °· Infections or inflammatory conditions, such as a flu, cold, or inflammation of the nasal membranes (rhinitis). °· Gastroesophageal reflux disease (GERD). GERD is a condition where stomach acid backs up into your esophagus. °· Exercise or strenuous activity. °SIGNS AND SYMPTOMS  °· Wheezing. °· Excessive coughing, particularly at night. °· Chest tightness. °· Shortness of breath. °DIAGNOSIS  °Your health care provider will ask you about your medical history and perform a physical exam. A chest X-ray or blood testing may be performed to look for other causes of your symptoms or other conditions that may have triggered your asthma attack.  °TREATMENT  °Treatment is aimed at reducing inflammation and opening up the airways in your lungs.  Most asthma attacks are treated with inhaled medicines. These include quick relief or rescue medicines (such as bronchodilators) and controller medicines (such as inhaled corticosteroids). These medicines are sometimes  given through an inhaler or a nebulizer. Systemic steroid medicine taken by mouth or given through an IV tube also can be used to reduce the inflammation when an attack is moderate or severe. Antibiotic medicines are only used if a bacterial infection is present.  °HOME CARE INSTRUCTIONS  °· Rest. °· Drink plenty of liquids. This helps the mucus to remain thin and be easily coughed up. Only use caffeine in moderation and do not use alcohol until you have recovered from your illness. °· Do not smoke. Avoid being exposed to secondhand smoke. °· You play a critical role in keeping yourself in good health. Avoid exposure to things that cause you to wheeze or to have breathing problems. °· Keep your medicines up-to-date and available. Carefully follow your health care provider's treatment plan. °· Take your medicine exactly as prescribed. °· When pollen or pollution is bad, keep windows closed and use an air conditioner or go to places with air conditioning. °· Asthma requires careful medical care. See your health care provider for a follow-up as advised. If you are more than [redacted] weeks pregnant and you were prescribed any new medicines, let your obstetrician know about the visit and how you are doing. Follow up with your health care provider as directed. °· After you have recovered from your asthma attack, make an appointment with your outpatient doctor to talk about ways to reduce the likelihood of future attacks. If you do not have a doctor who manages your asthma, make an appointment with a primary care doctor to discuss your asthma. °SEEK IMMEDIATE MEDICAL CARE IF:  °· You are getting worse. °· You have trouble breathing. If severe, call your local   emergency services (911 in the [place] °· You develop chest pain or discomfort. °· You are vomiting. °· You are not able to keep fluids down. °· You are coughing up yellow, green, brown, or bloody sputum. °· You have a fever and your symptoms suddenly get worse. °· You have  trouble swallowing. °MAKE SURE YOU:  °· Understand these instructions. °· Will watch your condition. °· Will get help right away if you are not doing well or get worse. °  °This information is not intended to replace advice given to you by your health care provider. Make sure you discuss any questions you have with your health care provider. °  °Document Released: 03/23/2007 Document Revised: 12/11/2013 Document Reviewed: 06/13/2013 °Elsevier Interactive Patient Education ©2016 [employer] ° °Chronic Obstructive Pulmonary Disease Exacerbation °Chronic obstructive pulmonary disease (COPD) is a common lung problem. In COPD, the flow of air from the lungs is limited. COPD exacerbations are times that breathing gets worse and you need extra treatment. Without treatment they can be life threatening. If they happen often, your lungs can become more damaged. If your COPD gets worse, your doctor may treat you with: °· Medicines. °· Oxygen. °· Different ways to clear your airway, such as using a mask. °HOME CARE °· Do not smoke. °· Avoid tobacco smoke and other things that bother your lungs. °· If given, take your antibiotic medicine as told. Finish the medicine even if you start to feel better. °· Only take medicines as told by your doctor. °· Drink enough fluids to keep your pee (urine) clear or pale yellow (unless your doctor has told you not to). °· Use a cool mist machine (vaporizer). °· If you use oxygen or a machine that turns liquid medicine into a mist (nebulizer), continue to use them as told. °· Keep up with shots (vaccinations) as told by your doctor. °· Exercise regularly. °· Eat healthy foods. °· Keep all doctor visits as told. °GET HELP RIGHT AWAY IF: °· You are very short of breath and it gets worse. °· You have trouble talking. °· You have bad chest pain. °· You have blood in your spit (sputum). °· You have a fever. °· You keep throwing up (vomiting). °· You feel weak, or you pass out (faint). °· You feel  confused. °· You keep getting worse. °MAKE SURE YOU: °· Understand these instructions. °· Will watch your condition. °· Will get help right away if you are not doing well or get worse. °  °This information is not intended to replace advice given to you by your health care provider. Make sure you discuss any questions you have with your health care provider. °  °Document Released: 11/25/2011 Document Revised: 12/27/2014 Document Reviewed: 08/10/2013 °Elsevier Interactive Patient Education ©2016 [employer] ° °

## 2015-11-07 ENCOUNTER — Ambulatory Visit (INDEPENDENT_AMBULATORY_CARE_PROVIDER_SITE_OTHER): Payer: BLUE CROSS/BLUE SHIELD | Admitting: Cardiovascular Disease

## 2015-11-07 ENCOUNTER — Encounter: Payer: Self-pay | Admitting: Cardiovascular Disease

## 2015-11-07 VITALS — BP 128/70 | HR 83 | Ht 70.0 in | Wt 160.8 lb

## 2015-11-07 DIAGNOSIS — I5022 Chronic systolic (congestive) heart failure: Secondary | ICD-10-CM | POA: Diagnosis not present

## 2015-11-07 DIAGNOSIS — I2510 Atherosclerotic heart disease of native coronary artery without angina pectoris: Secondary | ICD-10-CM | POA: Diagnosis not present

## 2015-11-07 DIAGNOSIS — E785 Hyperlipidemia, unspecified: Secondary | ICD-10-CM | POA: Diagnosis not present

## 2015-11-07 DIAGNOSIS — I10 Essential (primary) hypertension: Secondary | ICD-10-CM

## 2015-11-07 MED ORDER — ATORVASTATIN CALCIUM 40 MG PO TABS
40.0000 mg | ORAL_TABLET | Freq: Every day | ORAL | Status: DC
Start: 2015-11-07 — End: 2016-10-13

## 2015-11-07 NOTE — Progress Notes (Signed)
Primary care physician: Dr. [name]  HPI  This is a 64 year old male who is here today for a followup visit. He has known history of coronary artery disease status post anterior myocardial infarction in December of 2010. His LAD was occluded and RCA had a 99% stenosis. He had an angioplasty and drug-eluting stent placement to both LAD and RCA at that time. 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 Cardiac catheterization in 02/14  showed widely patent stents in the LAD and RCA with no evidence of other obstructive disease. Ejection fraction was 40% with mildly elevated left ventricular end-diastolic pressure. He has been doing reasonably well and denies any chest pain.  Unfortunately, he continues to smoke. He has been unable to quit smoking in spite of trying different methods and medications.  Carotid Doppler in November, 2015 showed only mild bilateral carotid stenosis. He is on prednisone for COPD exacerbation. He was started on metformin for type 2 diabetes.    Allergies  Allergen Reactions  . Wellbutrin [Bupropion] Other (See Comments)    Unknown/"made me feel real funny"     Current Outpatient Prescriptions on File Prior to Visit  Medication Sig Dispense Refill  . albuterol (PROVENTIL) (2.5 MG/3ML) 0.083% nebulizer solution USE 1 VIAL VIA NEBULIZER EVERY 6 HOURS AS NEEDED FOR WHEEZING OR SHORTNESS OF BREATH 360 vial 0  . albuterol (VENTOLIN HFA) 108 (90 BASE) MCG/ACT inhaler Inhale 2 puffs into the lungs every 6 (six) hours as needed. For wheezing 54 g 11  . aspirin 81 MG tablet Take 81 mg by mouth daily.      [name] atorvastatin (LIPITOR) 20 MG tablet TAKE 1 TABLET DAILY 90 tablet 3  . chlorpheniramine-HYDROcodone (TUSSIONEX PENNKINETIC ER) 10-8 MG/5ML SUER Take 5 mLs by mouth every 12 (twelve) hours as needed for cough. 115 mL 0  . clopidogrel (PLAVIX) 75 MG tablet Take 1 tablet (75 mg total) by mouth daily. 90 tablet 3  . CORTIFOAM 10 % rectal foam PLACE ONE  APPLICATION RECTALLY TWICE DAILY 15 g 5  . esomeprazole (NEXIUM) 40 MG capsule Take 1 capsule (40 mg total) by mouth 2 (two) times daily before a meal. 180 capsule 3  . Fluticasone-Salmeterol (ADVAIR DISKUS) 500-50 MCG/DOSE AEPB Inhale 2 puffs into the lungs daily. 3 each 3  . levofloxacin (LEVAQUIN) 500 MG tablet Take 1 tablet (500 mg total) by mouth daily. 10 tablet 0  . LORazepam (ATIVAN) 0.5 MG tablet Take 1 tablet (0.5 mg total) by mouth daily as needed for anxiety. 30 tablet 3  . losartan (COZAAR) 25 MG tablet Take 1 tablet (25 mg total) by mouth daily. 90 tablet 3  . metFORMIN (GLUCOPHAGE) 500 MG tablet Take 1 tablet (500 mg total) by mouth daily after supper. 90 tablet 1  . nitroGLYCERIN (NITROSTAT) 0.4 MG SL tablet Place 1 tablet (0.4 mg total) under the tongue every 5 (five) minutes as needed. 25 tablet 3  . predniSONE (DELTASONE) 20 MG tablet 3 tabs po qd for 2 days, then 2 tabs po qd for 5 days, then 1 tab po qd for 5 days 21 tablet 0  . predniSONE (STERAPRED UNI-PAK 21 TAB) 10 MG (21) TBPK tablet Sig 6 tablet day 1, 5 tablets day 2, 4 tablets day 3,,3tablets day 4, 2 tablets day 5, 1 tablet day 6 take all tablets orally Do not take until after allergy testing has been done on Thursday, 11/06/2015 21 tablet 0  . sertraline (ZOLOFT) 100 MG tablet  Take 1-2 tablets (100-200 mg total) by mouth daily. 180 tablet 3  . sucralfate (CARAFATE) 1 GM/10ML suspension TAKE 10ML( 2 TEASPOONSFUL) BY MOUTH FOUR TIMES DAILY 3600 mL 1   Current Facility-Administered Medications on File Prior to Visit  Medication Dose Route Frequency Provider Last Rate Last Dose  . albuterol (PROVENTIL) (2.5 MG/3ML) 0.083% nebulizer solution 2.5 mg  2.5 mg Nebulization Once [name], MD      . gi cocktail (Maalox,Lidocaine,Donnatal)  30 mL Oral Once [name], MD         Past Medical History  Diagnosis Date  . Hyperlipidemia   . COPD (chronic obstructive pulmonary disease) ([place])   . Depression   . GERD  (gastroesophageal reflux disease)   . Colon polyps 08/2012    colonoscopy; multiple colon polyps; repeat colonoscopy in one year.  . Status post dilation of esophageal narrowing 2000  . Peptic ulcer   . Hypertension   . CARDIOMYOPATHY 03/24/2010    did not tolerate Toprol and Lisinopril.   . Insomnia   . Hemorrhoids   . MI (myocardial infarction) ([place])   . Atherosclerosis   . Anxiety   . Headache(784.0)   . Tinea pedis   . Myalgia   . Helicobacter pylori (H. pylori)   . Thyroid dysfunction   . Chest pain   . Back pain   . Hernia     inguinal  . Allergic rhinitis   . Bronchitis     09-14-11  . Chronic airway obstruction, not elsewhere classified   . Coronary artery disease 11/2009    Anterior MI with late presentation 11/2009. Cath showed a 100% proximal LAD occlusion, 99% mid RCA. Had a PCI and 2 DES to both LAD and RCA. EF was 35%. Nuclear stress test 05/13: prior anterior/inferior infarcts without ischemic, EF 43%, cardiac cath in 02/14: Widely patent stents with no other obstructive disease. EF 40%      Past Surgical History  Procedure Laterality Date  . Penile prosthesis implant    . Esophagogastroduodenoscopy  2008  . Colonoscopy    . Esophageal dilation    . Hernia repair  07/20/2012    L inguinal hernia repair  . Admission  12/20/2012    COPD exacerbation.  [place].  . Esophagogastroduodenoscopy  08/20/2012  . Cardiac catheterization    . Coronary angioplasty with stent placement  09/2009    LAD 3.0 X23 mm Xience DES, RCA: 4.0 X 15 mm Xience DES  . Cardiac catheterization  02/05/13    [facility]  . Cardiac catheterization  10/14    [facility] : patent stents with no change in anatomy. EF: 40$     Family History  Problem Relation Age of Onset  . Heart attack Brother     Brother #1  . Diabetes Brother     brother #2  . Hypertension Brother     #3  . Coronary artery disease Father 76    deceased  . Heart attack Father   . Diabetes Father   . Heart disease Father   . COPD  Mother 40    deceased  . Heart attack Sister 75    acute-cocaine abuse/deceased  . Alcohol abuse Sister     polysubstance abuse  . COPD Sister   . Alcohol abuse Sister     polysubstance abuse  . Heart disease Brother     AMI x multiple     Social History   Social History  . Marital Status: Married  Spouse Name: N/A  . Number of Children: 5  . 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 Smoking status: Current Every Day Smoker -- 1.50 packs/day for 42 years    Types: Cigarettes  . Smokeless tobacco: Never Used  . Alcohol Use: No  . Drug Use: No  . Sexual Activity: Yes   Other Topics Concern  . Not on file   Social History Narrative   Marital status: married x 32 years; happily married.      Children: 4 children;  1 stepchild and 15 grandchildren; no great grandchildren.      Lives: with wife; 2 dogs, 1 cat.      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      Tobacco: 1 ppd x 45 years      Alcohol: none; previous alcoholism      Drugs: none      Exercise: no formal exercise; physically demanding job; owns horses.      Seatbelts:  100%      Guns: one loaded unsecured gun in the home.      Sunscreen: none      PHYSICAL EXAM   BP 128/70 mmHg  Pulse 83  Ht 5\' 10"  (1.778 m)  Wt 160 lb 12 oz (72.916 kg)  BMI 23.07 kg/m2 Constitutional: He is oriented to person, place, and time. He appears well-developed and well-nourished. No distress.  HENT: No nasal discharge.  Head: Normocephalic and atraumatic.  Eyes: Pupils are equal and round. Right eye exhibits no discharge. Left eye exhibits no discharge.  Neck: Normal range of motion. Neck supple. No JVD present. No thyromegaly present. There is a faint left carotid bruit Cardiovascular: Normal rate, regular rhythm, normal heart sounds and. Exam reveals no gallop and no friction rub. No murmur heard.  Pulmonary/Chest: Effort normal  and diminished breath sounds. No stridor. No respiratory distress. He has no wheezes. He has no rales. He exhibits no tenderness.  Abdominal: Soft. Bowel sounds are normal. He exhibits no distension. There is no tenderness. There is no rebound and no guarding.  Musculoskeletal: Normal range of motion. He exhibits no edema and no tenderness.  Neurological: He is alert and oriented to person, place, and time. Coordination normal.  Skin: Skin is warm and dry. No rash noted. He is not diaphoretic. No erythema. No pallor.  Psychiatric: He has a normal mood and affect. His behavior is normal. Judgment and thought content normal.      EKG:  Sinus  Rhythm  -Old anterior infarct.   -  Nonspecific T-abnormality.   Rightward axis and rotation -possible pulmonary disease.   ABNORMAL    ASSESSMENT AND PLAN

## 2015-11-07 NOTE — Patient Instructions (Signed)
Medication Instructions:  Your physician has recommended you make the following change in your medication:  INCREASE atorvastatin to 40mg once per day   Labwork: none  Testing/Procedures: none  Follow-Up: Your physician wants you to follow-up in: six months with Dr. Arida.  You will receive a reminder letter in the mail two months in advance. If you don't receive a letter, please call our office to schedule the follow-up appointment.   Any Other Special Instructions Will Be Listed Below (If Applicable).     If you need a refill on your cardiac medications before your next appointment, please call your pharmacy.   

## 2015-11-07 NOTE — Assessment & Plan Note (Signed)
Most recent ejection fraction was 40%. He did not tolerate treatment with a beta blocker due to worsening COPD. He is on losartan.

## 2015-11-07 NOTE — Assessment & Plan Note (Signed)
He is doing very well with no symptoms suggestive of angina. Continue medical therapy. 

## 2015-11-07 NOTE — Assessment & Plan Note (Signed)
Lab Results  Component Value Date   CHOL 165 10/20/2015   HDL 54 10/20/2015   LDLCALC 90 10/20/2015   TRIG 103 10/20/2015   CHOLHDL 3.1 10/20/2015   I increased the dose of atorvastatin 40 mg once daily to try to achieve an LDL of less than 70.

## 2015-11-07 NOTE — Assessment & Plan Note (Signed)
Blood pressure is well controlled on current medications. 

## 2015-11-10 ENCOUNTER — Other Ambulatory Visit: Payer: Self-pay | Admitting: Family Medicine

## 2015-11-18 ENCOUNTER — Encounter: Payer: Self-pay | Admitting: Cardiovascular Disease

## 2015-11-25 ENCOUNTER — Other Ambulatory Visit: Payer: Self-pay | Admitting: Family Medicine

## 2015-11-25 DIAGNOSIS — J441 Chronic obstructive pulmonary disease with (acute) exacerbation: Secondary | ICD-10-CM

## 2015-11-25 DIAGNOSIS — R0989 Other specified symptoms and signs involving the circulatory and respiratory systems: Secondary | ICD-10-CM

## 2015-12-05 ENCOUNTER — Other Ambulatory Visit: Payer: Self-pay | Admitting: Cardiovascular Disease

## 2015-12-05 DIAGNOSIS — R0989 Other specified symptoms and signs involving the circulatory and respiratory systems: Secondary | ICD-10-CM

## 2015-12-08 ENCOUNTER — Other Ambulatory Visit (INDEPENDENT_AMBULATORY_CARE_PROVIDER_SITE_OTHER): Payer: BLUE CROSS/BLUE SHIELD | Admitting: Family Medicine

## 2015-12-08 DIAGNOSIS — E119 Type 2 diabetes mellitus without complications: Secondary | ICD-10-CM | POA: Diagnosis not present

## 2015-12-08 DIAGNOSIS — J441 Chronic obstructive pulmonary disease with (acute) exacerbation: Secondary | ICD-10-CM

## 2015-12-08 LAB — GLUCOSE, POCT (MANUAL RESULT ENTRY): POC Glucose: 130 mg/dl — AB (ref 70–99)

## 2015-12-08 MED ORDER — ALBUTEROL SULFATE (2.5 MG/3ML) 0.083% IN NEBU: 2.5000 mg | INHALATION_SOLUTION | Freq: Four times a day (QID) | RESPIRATORY_TRACT | Status: DC | PRN

## 2015-12-17 ENCOUNTER — Telehealth: Payer: Self-pay

## 2015-12-17 NOTE — Telephone Encounter (Signed)
Left message on machine to call back  

## 2015-12-17 NOTE — Telephone Encounter (Signed)
Pt's wife called wanting to know the blood glucose results but I dont see a result. Can you check this for me?

## 2015-12-17 NOTE — Telephone Encounter (Signed)
Patient is calling to request lab results. Please call!

## 2015-12-19 NOTE — Telephone Encounter (Signed)
Spoke with Kennyth Lose (wife). She said that the pt had his fingerstick on the same day as she was seen. I do not see it documented in EPIC nor do I see it on paper at 104. Will talk to [place] when she comes in today. Kennyth Lose notified of all of this

## 2015-12-19 NOTE — Telephone Encounter (Signed)
Spoke with Dr. Tamala Julian. She said his pt's sugar was around 130. Leslie Sims notified. Value entered in EPIC

## 2016-02-04 ENCOUNTER — Telehealth: Payer: Self-pay | Admitting: *Deleted

## 2016-02-04 DIAGNOSIS — R0789 Other chest pain: Secondary | ICD-10-CM

## 2016-02-04 DIAGNOSIS — I5022 Chronic systolic (congestive) heart failure: Secondary | ICD-10-CM

## 2016-02-04 MED ORDER — LOSARTAN POTASSIUM 25 MG PO TABS: 25.0000 mg | ORAL_TABLET | Freq: Every day | ORAL | Status: DC

## 2016-02-04 MED ORDER — CLOPIDOGREL BISULFATE 75 MG PO TABS: 75.0000 mg | ORAL_TABLET | Freq: Every day | ORAL | Status: DC

## 2016-02-04 NOTE — Telephone Encounter (Signed)
Plavix and losartan sent for a 90 day supply to [facility].

## 2016-02-04 NOTE — Telephone Encounter (Signed)
°*  STAT* If patient is at the pharmacy, call can be transferred to refill team.   1. Which medications need to be refilled? (please list name of each medication and dose if known) Clopidogrel, losartan   2. Which pharmacy/location (including street and city if local pharmacy) is medication to be sent to? [facility]  3. Do they need a 30 day or 90 day supply? 90 day

## 2016-02-10 ENCOUNTER — Telehealth: Payer: Self-pay | Admitting: Family Medicine

## 2016-02-10 DIAGNOSIS — J449 Chronic obstructive pulmonary disease, unspecified: Secondary | ICD-10-CM

## 2016-02-10 NOTE — Telephone Encounter (Signed)
SPOKE WITH PATIENT AND LET HIM KNOW THAT DR Tamala Julian WAS GOING TO ORDER THE OXYGEN IN HIS NAME.

## 2016-02-10 NOTE — Telephone Encounter (Signed)
PATIENT WOULD LIKE TO KNOW IF YOU CAN WRITE A PRESCRIPTION FOR HIM TO KEEP THE OXYGEN THAT HIS WIFE HAD?

## 2016-02-12 NOTE — Telephone Encounter (Signed)
Please call ---- I can order nighttime oxygen for patient; however, home health will likely need to remove wife's oxygen from the home and deliver his oxygen to the home (if he qualifies).

## 2016-02-16 ENCOUNTER — Ambulatory Visit: Payer: BLUE CROSS/BLUE SHIELD | Admitting: Family Medicine

## 2016-02-16 NOTE — Telephone Encounter (Signed)
Spoke with pt, he agrees with Dr. Tamala Julian. Dr. Tamala Julian do you want to write it on a paper Rx?

## 2016-02-27 NOTE — Telephone Encounter (Signed)
Dr Tamala Julian order pended, please sign.

## 2016-02-27 NOTE — Telephone Encounter (Signed)
I signed order for oxygen.  He needs [facility] to deliver oxygen to home for nighttime use for COPD.  Please coordinate referral.

## 2016-02-27 NOTE — Telephone Encounter (Signed)
Patient's spouse is calling because he hasn't heard anything. Please call! [phone]

## 2016-02-28 ENCOUNTER — Encounter: Payer: Self-pay | Admitting: Emergency Medicine

## 2016-02-28 ENCOUNTER — Ambulatory Visit (INDEPENDENT_AMBULATORY_CARE_PROVIDER_SITE_OTHER): Payer: BLUE CROSS/BLUE SHIELD

## 2016-02-28 ENCOUNTER — Ambulatory Visit
Admission: EM | Admit: 2016-02-28 | Discharge: 2016-02-28 | Disposition: A | Discharge status: Home / Self Care | Payer: BLUE CROSS/BLUE SHIELD | Attending: Family Medicine | Admitting: Family Medicine

## 2016-02-28 DIAGNOSIS — H6593 Unspecified nonsuppurative otitis media, bilateral: Secondary | ICD-10-CM

## 2016-02-28 DIAGNOSIS — J301 Allergic rhinitis due to pollen: Secondary | ICD-10-CM

## 2016-02-28 DIAGNOSIS — J441 Chronic obstructive pulmonary disease with (acute) exacerbation: Secondary | ICD-10-CM

## 2016-02-28 DIAGNOSIS — J209 Acute bronchitis, unspecified: Secondary | ICD-10-CM

## 2016-02-28 LAB — URINALYSIS COMPLETE WITH MICROSCOPIC (ARMC ONLY)
BILIRUBIN URINE: NEGATIVE
Bacteria, UA: NONE SEEN
GLUCOSE, UA: NEGATIVE mg/dL
Hgb urine dipstick: NEGATIVE
KETONES UR: NEGATIVE mg/dL
Leukocytes, UA: NEGATIVE
Nitrite: NEGATIVE
PROTEIN: NEGATIVE mg/dL
RBC / HPF: NONE SEEN RBC/hpf (ref 0–5)
SPECIFIC GRAVITY, URINE: 1.01 (ref 1.005–1.030)
SQUAMOUS EPITHELIAL / LPF: NONE SEEN
pH: 6.5 (ref 5.0–8.0)

## 2016-02-28 IMAGING — CR DG CHEST 2V
3 series · 3 of 3 positions shown · non-contrast
Comparison: [DATE]

CLINICAL DATA: Shortness of breath, productive cough x3 weeks

EXAM:
CHEST  2 VIEW

[chest pa (1 of 2)]
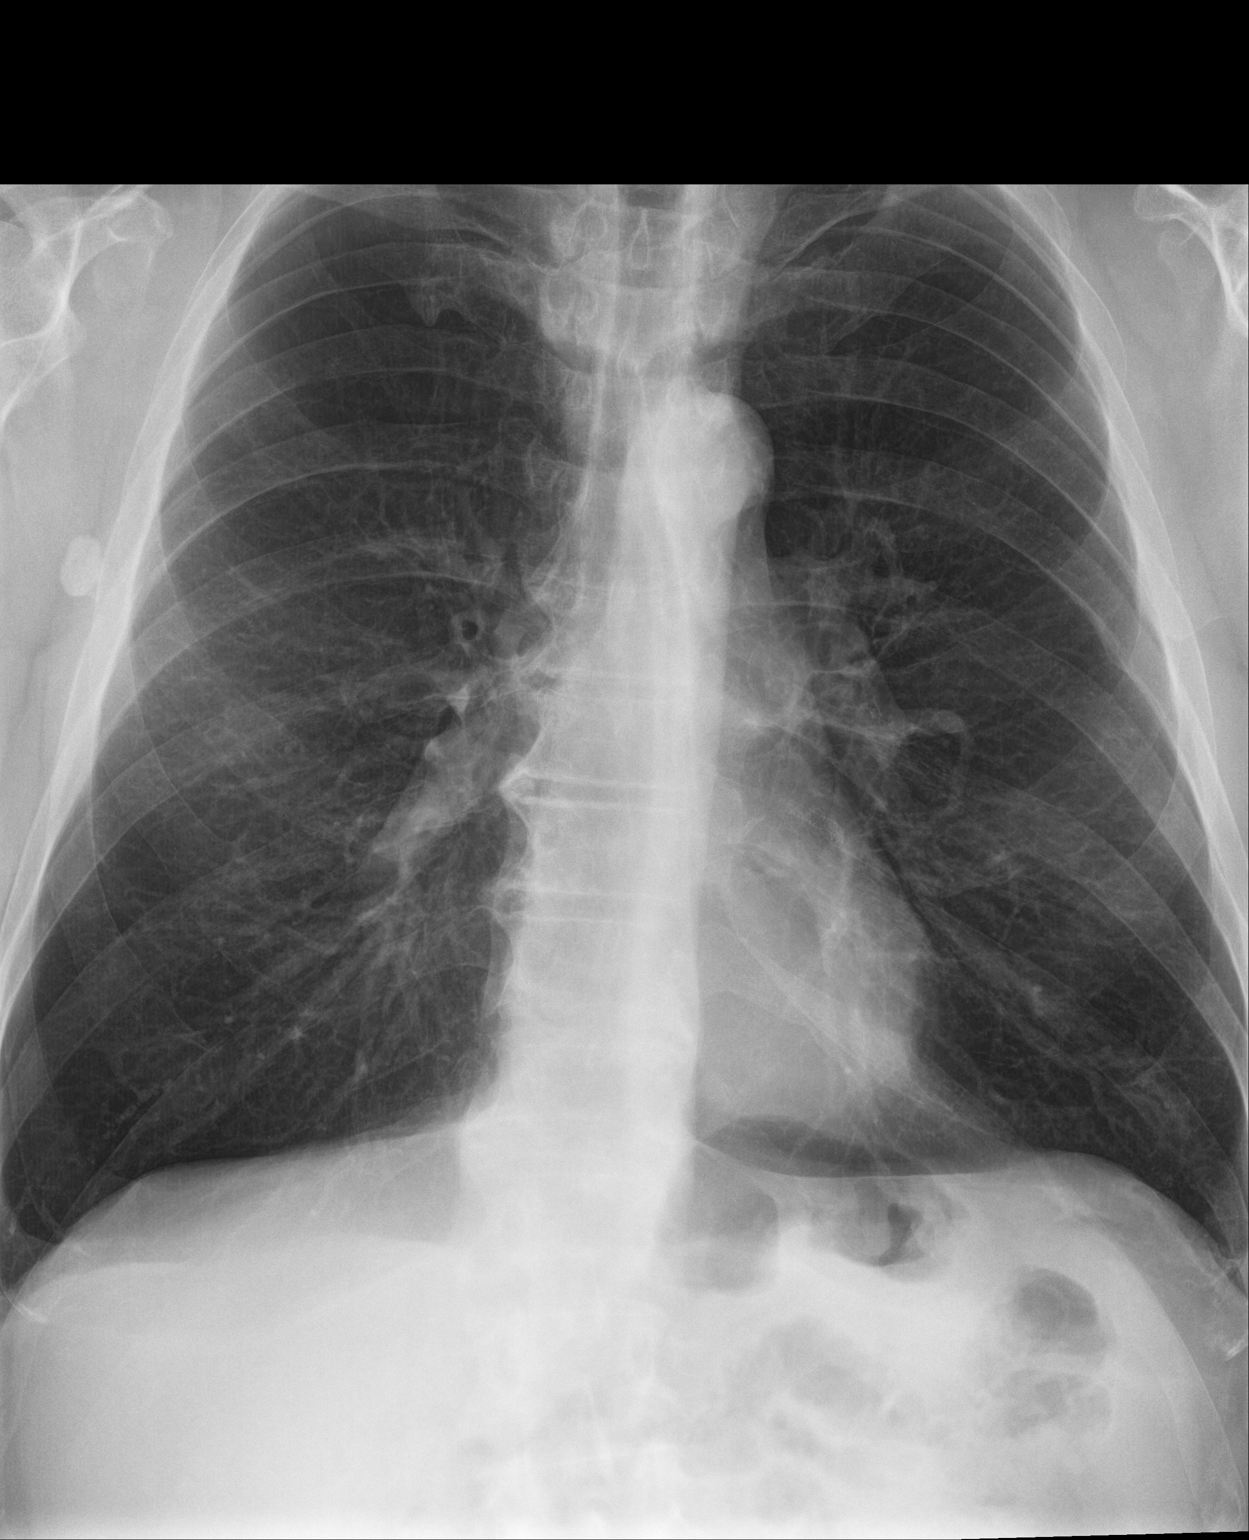

[chest lat]
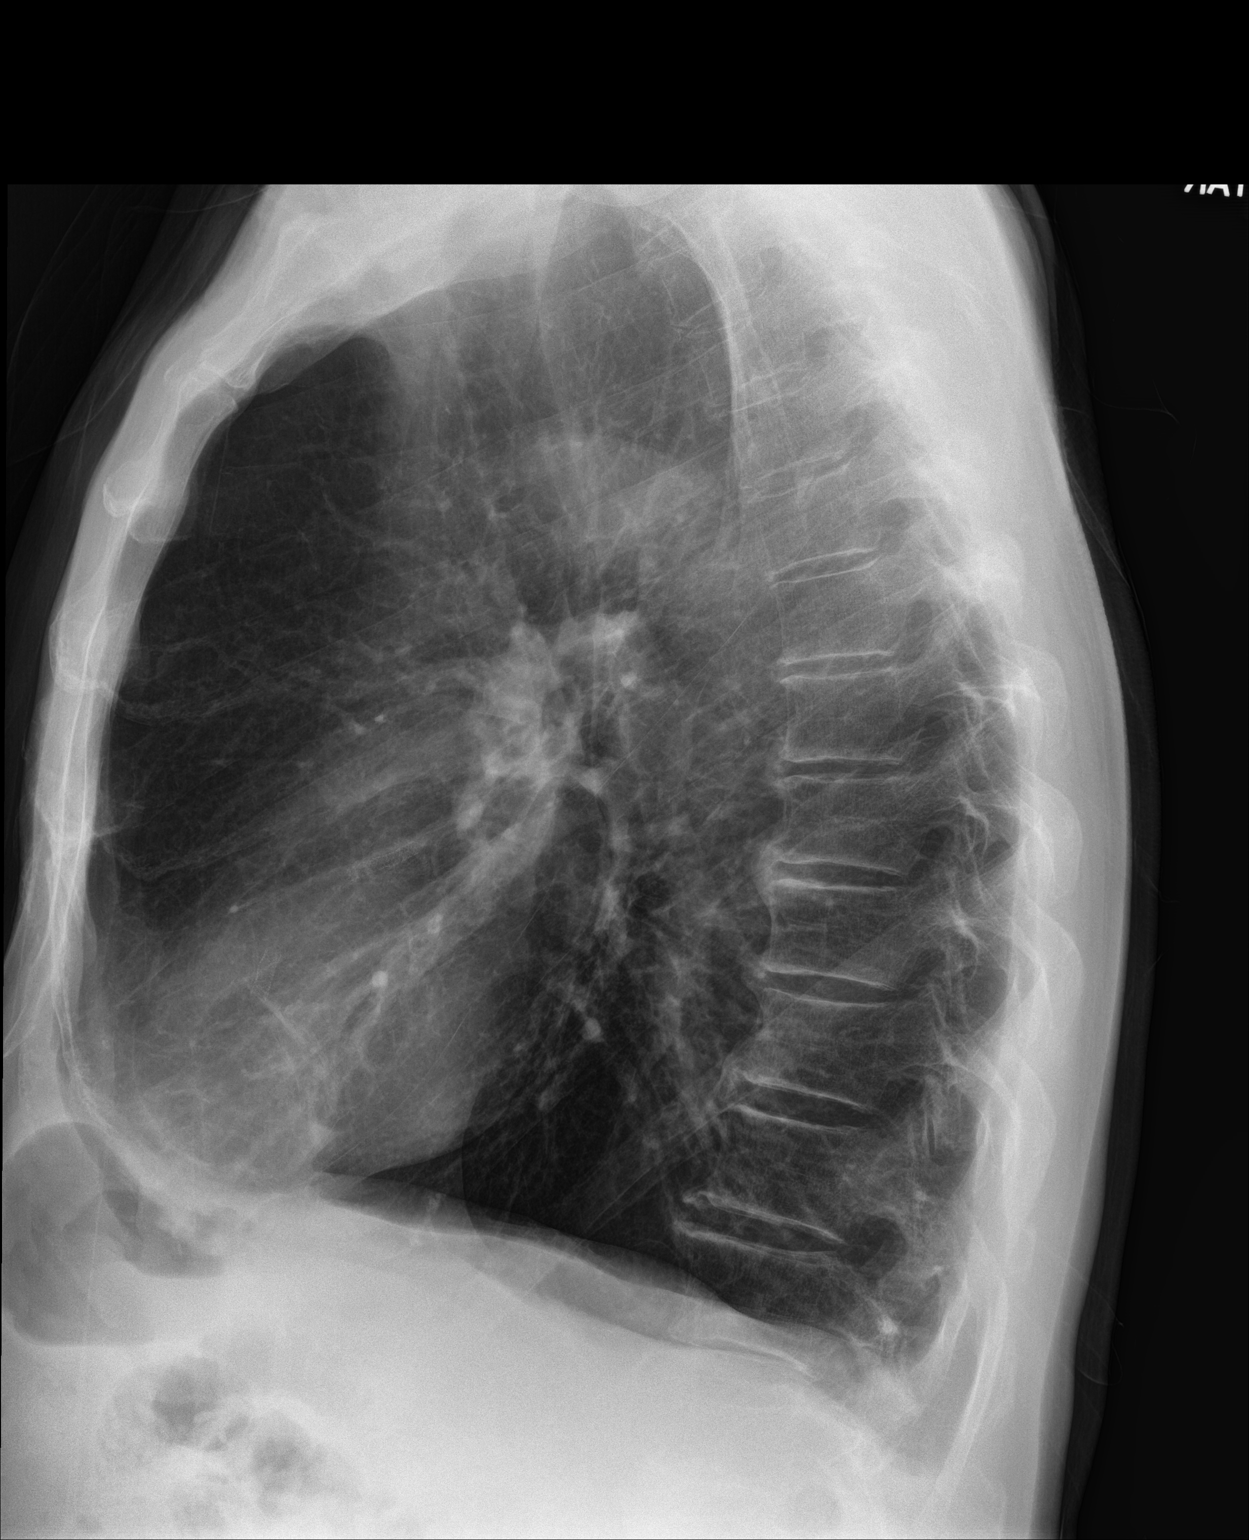

[chest pa (2 of 2)]
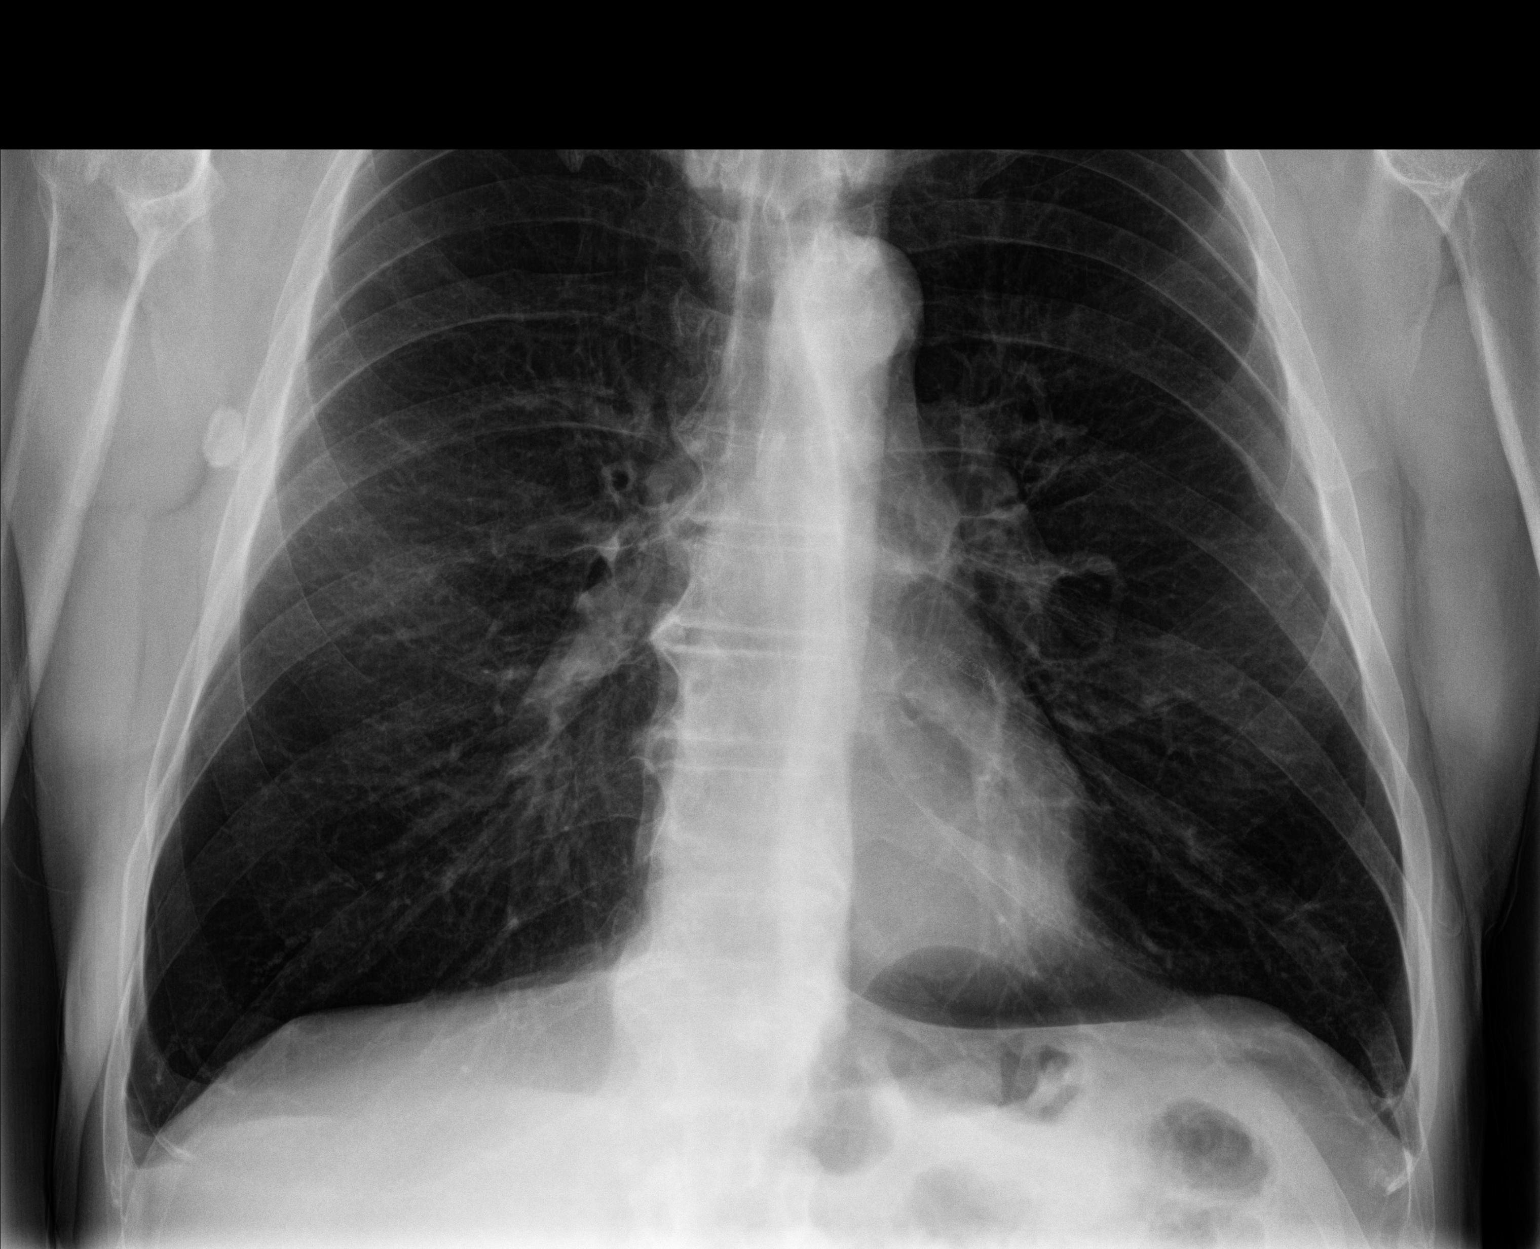

[3 of 3 positions shown; findings below may reference images not displayed]

FINDINGS: Lungs are clear.  No pleural effusion or pneumothorax.

The heart is normal in size.

Degenerative changes of the visualized thoracolumbar spine.
IMPRESSION: No evidence of acute cardiopulmonary disease.

## 2016-02-28 MED ORDER — IPRATROPIUM-ALBUTEROL 0.5-2.5 (3) MG/3ML IN SOLN
3.0000 mL | Freq: Once | RESPIRATORY_TRACT | Status: AC
  Administered 2016-02-28: 3 mL via RESPIRATORY_TRACT

## 2016-02-28 MED ORDER — PREDNISONE 20 MG PO TABS: 40.0000 mg | ORAL_TABLET | Freq: Every day | ORAL | Status: DC

## 2016-02-28 MED ORDER — BENZONATATE 200 MG PO CAPS: 200.0000 mg | ORAL_CAPSULE | Freq: Three times a day (TID) | ORAL | Status: DC | PRN

## 2016-02-28 MED ORDER — FLUTICASONE PROPIONATE 50 MCG/ACT NA SUSP: 1.0000 | Freq: Two times a day (BID) | NASAL | Status: DC

## 2016-02-28 MED ORDER — DOXYCYCLINE HYCLATE 100 MG PO CAPS
100.0000 mg | ORAL_CAPSULE | Freq: Two times a day (BID) | ORAL | Status: DC
Start: 2016-02-28 — End: 2016-03-29

## 2016-02-28 MED ORDER — GUAIFENESIN-CODEINE 100-10 MG/5ML PO SYRP: 5.0000 mL | ORAL_SOLUTION | Freq: Every evening | ORAL | Status: AC | PRN

## 2016-02-28 MED ORDER — SALINE SPRAY 0.65 % NA SOLN: 2.0000 | NASAL | Status: DC

## 2016-02-28 MED ORDER — LEVOFLOXACIN 500 MG PO TABS: 500.0000 mg | ORAL_TABLET | Freq: Every day | ORAL | Status: DC

## 2016-02-28 MED ORDER — LORATADINE 10 MG PO TABS: 10.0000 mg | ORAL_TABLET | Freq: Every day | ORAL | Status: DC

## 2016-02-28 NOTE — ED Provider Notes (Signed)
CSN: [id number]     Arrival date & time 02/28/16  1017 History   First MD Initiated Contact with Patient 02/28/16 1033     Chief Complaint  Patient presents with  . Shortness of Breath  . Back Pain   (Consider location/radiation/quality/duration/timing/severity/associated sxs/prior Treatment) HPI Comments: Established Single caucasian male here for evaluation of cough x 3 weeks, worsening SOB.  Has been working on house with mold in dirt.  His coworker with cough also.  Using inhalers and nebulizers 6-8 times per day helps for a little while and then wheezing restarts.  Appt scheduled 02 Mar 2016 with PCM but wheezing worsening couldn't wait.  PMHx spring seasonal allergies currently not taking his allergy medication pills has not tried flonase.  Left flank pain and thorax pain with cough burning with urination this am; Still smoking 1 1/2 PPD tussionex helped with cough last time from Dr Alveta Heimlich and levofloxacin and prednisone cleared up his wheezing Nov 2016  Patient requesting duoneb treatment "it works better here in the clinic than the one I have at home".  Patient is a 65 y.o. male presenting with shortness of breath and back pain. The history is provided by the patient.  Shortness of Breath Severity:  Moderate Onset quality:  Sudden Duration:  3 weeks Timing:  Constant Progression:  Worsening Chronicity:  Recurrent Context: activity, known allergens, occupational exposure, pollens, smoke exposure and weather changes   Context: not animal exposure, not emotional upset, not fumes, not strong odors and not URI   Relieved by:  Inhaler Worsened by:  Exertion Ineffective treatments:  Inhaler, lying down, position changes and rest Associated symptoms: cough and wheezing   Associated symptoms: no abdominal pain, no chest pain, no claudication, no diaphoresis, no ear pain, no fever, no headaches, no hemoptysis, no neck pain, no PND, no rash, no sore throat, no sputum production, no syncope, no  swollen glands and no vomiting   Cough:    Cough characteristics:  Non-productive   Severity:  Moderate   Onset quality:  Gradual   Duration:  3 weeks   Timing:  Constant   Progression:  Worsening   Chronicity:  Recurrent Wheezing:    Severity:  Moderate   Onset quality:  Gradual   Duration:  3 weeks   Timing:  Constant   Progression:  Worsening   Chronicity:  Recurrent Risk factors: tobacco use   Risk factors: no hx of PE/DVT, no obesity, no oral contraceptive use, no prolonged immobilization and no recent surgery   Back Pain Location:  Thoracic spine Radiates to:  Does not radiate Pain severity:  Mild Pain is:  Same all the time Onset quality:  Gradual Relieved by:  Nothing Worsened by:  Coughing Ineffective treatments:  Lying down and being still Associated symptoms: dysuria   Associated symptoms: no abdominal pain, no chest pain, no fever, no headaches and no weakness     Past Medical History  Diagnosis Date  . Hyperlipidemia   . COPD (chronic obstructive pulmonary disease) ([place])   . Depression   . GERD (gastroesophageal reflux disease)   . Colon polyps 08/2012    colonoscopy; multiple colon polyps; repeat colonoscopy in one year.  . Status post dilation of esophageal narrowing 2000  . Peptic ulcer   . Hypertension   . CARDIOMYOPATHY 03/24/2010    did not tolerate Toprol and Lisinopril.   . Insomnia   . Hemorrhoids   . MI (myocardial infarction) ([place])   . Atherosclerosis   .  Anxiety   . Headache(784.0)   . Tinea pedis   . Myalgia   . Helicobacter pylori (H. pylori)   . Thyroid dysfunction   . Chest pain   . Back pain   . Hernia     inguinal  . Allergic rhinitis   . Bronchitis     09-14-11  . Chronic airway obstruction, not elsewhere classified   . Coronary artery disease 11/2009    Anterior MI with late presentation 11/2009. Cath showed a 100% proximal LAD occlusion, 99% mid RCA. Had a PCI and 2 DES to both LAD and RCA. EF was 35%. Nuclear stress test  05/13: prior anterior/inferior infarcts without ischemic, EF 43%, cardiac cath in 02/14: Widely patent stents with no other obstructive disease. EF 40%    Past Surgical History  Procedure Laterality Date  . Penile prosthesis implant    . Esophagogastroduodenoscopy  2008  . Colonoscopy    . Esophageal dilation    . Hernia repair  07/20/2012    L inguinal hernia repair  . Admission  12/20/2012    COPD exacerbation.  [place].  . Esophagogastroduodenoscopy  08/20/2012  . Cardiac catheterization    . Coronary angioplasty with stent placement  09/2009    LAD 3.0 X23 mm Xience DES, RCA: 4.0 X 15 mm Xience DES  . Cardiac catheterization  02/05/13    [facility]  . Cardiac catheterization  10/14    [facility] : patent stents with no change in anatomy. EF: 40$   Family History  Problem Relation Age of Onset  . Heart attack Brother     Brother #1  . Diabetes Brother     brother #2  . Hypertension Brother     #3  . Coronary artery disease Father 15    deceased  . Heart attack Father   . Diabetes Father   . Heart disease Father   . COPD Mother 43    deceased  . Heart attack Sister 62    acute-cocaine abuse/deceased  . Alcohol abuse Sister     polysubstance abuse  . COPD Sister   . Alcohol abuse Sister     polysubstance abuse  . Heart disease Brother     AMI x multiple   Social History  Substance Use Topics  . Smoking status: Current Every Day Smoker -- 1.50 packs/day for 42 years    Types: Cigarettes  . Smokeless tobacco: Never Used  . Alcohol Use: No    Review of Systems  Constitutional: Positive for activity change and fatigue. Negative for fever, diaphoresis and appetite change.  HENT: Positive for postnasal drip. Negative for congestion, dental problem, drooling, ear discharge, ear pain, facial swelling, hearing loss, mouth sores, nosebleeds, rhinorrhea, sinus pressure, sneezing, sore throat, tinnitus, trouble swallowing and voice change.   Eyes: Negative for photophobia, pain, discharge,  redness, itching and visual disturbance.  Respiratory: Positive for cough, shortness of breath and wheezing. Negative for apnea, hemoptysis, sputum production, choking and chest tightness.   Cardiovascular: Negative for chest pain, palpitations, claudication, leg swelling, syncope and PND.  Gastrointestinal: Negative for nausea, vomiting, abdominal pain, diarrhea, blood in stool and abdominal distention.  Endocrine: Negative for cold intolerance and heat intolerance.  Genitourinary: Positive for dysuria and flank pain. Negative for urgency, frequency, hematuria, discharge, penile swelling, scrotal swelling, enuresis, difficulty urinating, genital sores, penile pain and testicular pain.  Musculoskeletal: Positive for myalgias and back pain. Negative for joint swelling, arthralgias, gait problem, neck pain and neck stiffness.  Skin: Negative  for color change, pallor, rash and wound.  Allergic/Immunologic: Positive for environmental allergies. Negative for food allergies.  Neurological: Negative for dizziness, tremors, seizures, syncope, facial asymmetry, speech difficulty, weakness, light-headedness and headaches.  Hematological: Negative for adenopathy. Does not bruise/bleed easily.  Psychiatric/Behavioral: Positive for sleep disturbance. Negative for behavioral problems, confusion and agitation.    Allergies  Wellbutrin  Home Medications   Prior to Admission medications   Medication Sig Start Date End Date Taking? Authorizing Provider  ADVAIR DISKUS 500-50 MCG/DOSE AEPB USE 2 INHALATIONS DAILY 11/10/15   [name], MD  albuterol (PROVENTIL) (2.5 MG/3ML) 0.083% nebulizer solution Take 3 mLs (2.5 mg total) by nebulization every 6 (six) hours as needed for wheezing or shortness of breath. 12/08/15   [name], MD  albuterol (VENTOLIN HFA) 108 (90 BASE) MCG/ACT inhaler Inhale 2 puffs into the lungs every 6 (six) hours as needed. For wheezing 11/07/14   [name], MD  aspirin 81 MG  tablet Take 81 mg by mouth daily.      Historical Provider, MD  atorvastatin (LIPITOR) 40 MG tablet Take 1 tablet (40 mg total) by mouth daily. 11/07/15   Wellington Hampshire, MD  benzonatate (TESSALON) 200 MG capsule Take 1 capsule (200 mg total) by mouth 3 (three) times daily as needed for cough. 02/28/16   Olen Cordial, NP  clopidogrel (PLAVIX) 75 MG tablet Take 1 tablet (75 mg total) by mouth daily. 02/04/16   Minna Merritts, MD  CORTIFOAM 10 % rectal foam PLACE ONE APPLICATION RECTALLY TWICE DAILY 10/17/15   [name], MD  esomeprazole (NEXIUM) 40 MG capsule Take 1 capsule (40 mg total) by mouth 2 (two) times daily before a meal. 06/13/15   [name], MD  fluticasone (FLONASE) 50 MCG/ACT nasal spray Place 1 spray into both nostrils 2 (two) times daily. 02/28/16   Olen Cordial, NP  guaiFENesin-codeine (ROBITUSSIN AC) 100-10 MG/5ML syrup Take 5 mLs by mouth at bedtime as needed for cough or congestion (do not take ativan if taking cheratussin). 02/28/16 03/13/16  Olen Cordial, NP  levofloxacin (LEVAQUIN) 500 MG tablet Take 1 tablet (500 mg total) by mouth daily. 02/28/16 03/08/16  Olen Cordial, NP  loratadine (CLARITIN) 10 MG tablet Take 1 tablet (10 mg total) by mouth daily. 02/28/16   Olen Cordial, NP  LORazepam (ATIVAN) 0.5 MG tablet Take 1 tablet (0.5 mg total) by mouth daily as needed for anxiety. 09/28/15   [name], MD  losartan (COZAAR) 25 MG tablet Take 1 tablet (25 mg total) by mouth daily. 02/04/16   Minna Merritts, MD  metFORMIN (GLUCOPHAGE) 500 MG tablet Take 1 tablet (500 mg total) by mouth daily after supper. 10/26/15   [name], MD  nitroGLYCERIN (NITROSTAT) 0.4 MG SL tablet Place 1 tablet (0.4 mg total) under the tongue every 5 (five) minutes as needed. 03/13/15   Minna Merritts, MD  predniSONE (DELTASONE) 20 MG tablet Take 2 tablets (40 mg total) by mouth daily with breakfast. 02/28/16   Olen Cordial, NP  sertraline (ZOLOFT) 100 MG tablet  Take 1-2 tablets (100-200 mg total) by mouth daily. 06/13/15   [name], MD  sodium chloride (OCEAN) 0.65 % SOLN nasal spray Place 2 sprays into both nostrils every 2 (two) hours while awake. 02/28/16 03/20/16  Olen Cordial, NP  SPIRIVA HANDIHALER 18 MCG inhalation capsule PLACE 1 CAPSULE INTO INHALER AND INHALE CONTENTS OF 1 CAPSULE DAILY  11/10/15   [name], MD  sucralfate (CARAFATE) 1 GM/10ML suspension TAKE 10ML( 2 TEASPOONSFUL) BY MOUTH FOUR TIMES DAILY 01/21/15   [name], MD   Meds Ordered and Administered this Visit   Medications  ipratropium-albuterol (DUONEB) 0.5-2.5 (3) MG/3ML nebulizer solution 3 mL (3 mLs Nebulization Given 02/28/16 1128)    BP 143/76 mmHg  Pulse 66  Temp(Src) 97 F (36.1 C) (Tympanic)  Resp 17  Ht 5\' 10"  (1.778 m)  Wt 160 lb (72.576 kg)  BMI 22.96 kg/m2  SpO2 98% No data found.   Physical Exam  Constitutional: He is oriented to person, place, and time. Vital signs are normal. He appears well-developed and well-nourished. He is active and cooperative.  Non-toxic appearance. He does not have a sickly appearance. He appears ill. No distress.  HENT:  Head: Normocephalic and atraumatic.  Right Ear: External ear and ear canal normal. Tympanic membrane is injected. A middle ear effusion is present.  Left Ear: Hearing, external ear and ear canal normal. Tympanic membrane is injected. A middle ear effusion is present.  Nose: Mucosal edema and rhinorrhea present. No nose lacerations, sinus tenderness, nasal deformity, septal deviation or nasal septal hematoma. No epistaxis.  No foreign bodies. Right sinus exhibits no maxillary sinus tenderness and no frontal sinus tenderness. Left sinus exhibits no maxillary sinus tenderness and no frontal sinus tenderness.  Mouth/Throat: Uvula is midline and mucous membranes are normal. Mucous membranes are not pale, not dry and not cyanotic. He does not have dentures. No oral lesions. No trismus in the jaw. Normal  dentition. No dental abscesses, uvula swelling, lacerations or dental caries. Posterior oropharyngeal edema and posterior oropharyngeal erythema present. No oropharyngeal exudate or tonsillar abscesses.  Cobblestoning posterior pharynx; bilateral TMs with excoriated vasculature clear air fluid level; bilateral nasal turbinates with edema/erythema clear discharge;   Eyes: Conjunctivae, EOM and lids are normal. Pupils are equal, round, and reactive to light. Right eye exhibits no chemosis, no discharge, no exudate and no hordeolum. No foreign body present in the right eye. Left eye exhibits no chemosis, no discharge, no exudate and no hordeolum. No foreign body present in the left eye. Right conjunctiva is not injected. Right conjunctiva has no hemorrhage. Left conjunctiva is not injected. Left conjunctiva has no hemorrhage. No scleral icterus. Right eye exhibits normal extraocular motion and no nystagmus. Left eye exhibits normal extraocular motion and no nystagmus. Right pupil is round and reactive. Left pupil is round and reactive. Pupils are equal.  Neck: Trachea normal and normal range of motion. Neck supple. No tracheal tenderness, no spinous process tenderness and no muscular tenderness present. No rigidity. No tracheal deviation, no edema, no erythema and normal range of motion present. No thyroid mass and no thyromegaly present.  Cardiovascular: Normal rate, regular rhythm, S1 normal, S2 normal, normal heart sounds and intact distal pulses.  PMI is not displaced.  Exam reveals no gallop and no friction rub.   No murmur heard. Pulmonary/Chest: Effort normal. No stridor. No respiratory distress. He has decreased breath sounds in the right lower field and the left lower field. He has no wheezes. He has no rhonchi. He has no rales.  Negative egophany all fields; nonproductive cough in exam room intermittent; able to speak in full sentences  Abdominal: Soft. He exhibits no distension.  Musculoskeletal:  Normal range of motion. He exhibits no edema or tenderness.       Right shoulder: Normal.       Left shoulder: Normal.  Right elbow: Normal.      Left elbow: Normal.       Right hip: Normal.       Left hip: Normal.       Right knee: Normal.       Left knee: Normal.       Cervical back: Normal.       Thoracic back: He exhibits pain. He exhibits normal range of motion, no tenderness, no bony tenderness, no swelling, no edema, no deformity, no laceration, no spasm and normal pulse.       Lumbar back: He exhibits pain. He exhibits normal range of motion, no tenderness, no bony tenderness, no swelling, no edema, no deformity, no laceration, no spasm and normal pulse.       Back:       Right hand: Normal.       Left hand: Normal.  Lymphadenopathy:       Head (right side): No submental, no submandibular, no tonsillar, no preauricular, no posterior auricular and no occipital adenopathy present.       Head (left side): No submental, no submandibular, no tonsillar, no preauricular, no posterior auricular and no occipital adenopathy present.    He has no cervical adenopathy.       Right cervical: No superficial cervical, no deep cervical and no posterior cervical adenopathy present.      Left cervical: No superficial cervical, no deep cervical and no posterior cervical adenopathy present.  Neurological: He is alert and oriented to person, place, and time. He displays no atrophy and no tremor. No cranial nerve deficit or sensory deficit. He exhibits normal muscle tone. He displays no seizure activity. Coordination and gait normal. GCS eye subscore is 4. GCS verbal subscore is 5. GCS motor subscore is 6.  Skin: Skin is warm, dry and intact. No abrasion, no bruising, no burn, no ecchymosis, no laceration, no lesion, no petechiae and no rash noted. He is not diaphoretic. No cyanosis or erythema. No pallor. Nails show no clubbing.  Psychiatric: He has a normal mood and affect. His speech is normal and  behavior is normal. Judgment and thought content normal. Cognition and memory are normal.  Nursing note and vitals reviewed.   ED Course  Procedures (including critical care time)  Labs Review Labs Reviewed  URINALYSIS COMPLETEWITH MICROSCOPIC ([place])    Imaging Review Dg Chest 2 View  02/28/2016  CLINICAL DATA:  Shortness of breath, productive cough x3 weeks EXAM: CHEST  2 VIEW COMPARISON:  09/28/2015 FINDINGS: Lungs are clear.  No pleural effusion or pneumothorax. The heart is normal in size. Degenerative changes of the visualized thoracolumbar spine. IMPRESSION: No evidence of acute cardiopulmonary disease. Electronically Signed   By: Julian Hy M.D.   On: 02/28/2016 11:09    Reviewed [place] Controlled Substances Website: Last Name:  [place]  County:  First Name:  [place]  Zip Code:  Date of Birth:  1950-12-28  Dispensed Start Date:  12/30/2014 Dispensed End Date:  02/28/2016  Recipients:        Dispensed From 12/30/2014 to 02/28/2016 3 out of 3 Recipient(s) Selected Jerelene Redden - DOB: [birth year] - [address] [place], [place] - DOB: 03/11/1951 - [address] [place], [place] - DOB: 1951/02/04 - [address] " Date Dispensed/ Date Prescribed Drug Name/ NDC Qty. Dispensed/ Days Supply Refill #/ Authorized Refills Programmer, multimedia Recipient *Pmt. Method MED Daily  02/16/2016 09/28/2015 LORAZEPAM 0.5 MG TABLET [id number] 30 30 1 3  [id number]  [name] MD [place], [place] [id number] [place], [place], [place]alberto December 31, 1950 [address] [place], [place] [address] 04 0  11/04/2015 11/03/2015 [facility] [id number] 115 11 0 0 [id number] [place] [address] MD [place], [place] [id number] [place], [place], [place] Sep 07, 1951 [address] [place], [place] [address] 04 0  09/29/2015 09/28/2015 LORAZEPAM 0.5 MG TABLET [id number] 30 30 0 3 [id number] [place], [place] [id number]  [place], [place] Reckner, [place] May 09, 1951 [address] [place], [place] [address] 04 0  01/24/2015 01/24/2015 LORAZEPAM 0.5 MG TABLET [id number] 30 30 0 0 [id number] [place], [place] [id number] [facility] [place], [place] OBAMA, GARNO 10-15-1951 [address] [place], [place] [address] 04 0   1133 duoneb 24ml administered by RN Tula Nakayama.  Urine sample submitted by patient and transported to lab for urinalysis.  1145 patient feeling slightly better after duoneb.  Discussed urinalysis normal/negative for infection.  Push po fluids as yellow color sample to decrease to pale yellow.  BBS CTA improved airflow to bases bilaterally.  Keep follow up appt with [facility] Tuesday for re-evaluation.  Patient verbalized understanding of information/instructions, agreed with plan of care and had no further questions at this time.  MDM   1. Bronchitis, acute, with bronchospasm   2. Otitis media with effusion, bilateral   3. Allergic rhinitis due to pollen   4. COPD with exacerbation (HCC)    Doxycycline 100mg  po BID x 10 days; tessalon pearles 200mg  po TID prn cough during day; robitussin ac 27ml po at bedtime hold ativan at bedtime if taking robitussin.  Avoid alcohol and driving after taking robitussin.  Prednisone 40mg  po daily x 5 days.  Bronchitis simple, community acquired, may have started as viral (probably respiratory syncytial, parainfluenza, influenza, or adenovirus), but now evidence of acute purulent bronchitis with resultant bronchial edema and mucus formation.  Viruses are the most common cause of bronchial inflammation in otherwise healthy adults with acute bronchitis.  The appearance of sputum is not predictive of whether a bacterial infection is present.  Purulent sputum is most often caused by viral infections.  There are a small portion of those caused by non-viral agents being Mycoplamsa pneumonia.  Microscopic examination or C&S of sputum in the healthy adult  with acute bronchitis is generally not helpful (usually negative or normal respiratory flora) other considerations being cough from upper respiratory tract infections, sinusitis or allergic syndromes (mild asthma or viral pneumonia).  Differential Diagnosis:  reactive airway disease (asthma, allergic aspergillosis (eosinophilia), chronic bronchitis, respiratory infection (Sinusitis, Common cold, pneumonia), congestive heart failure, reflux esophagitis, bronchogenic tumor, aspiration syndromes and/or exposure irritants/tobacco smoke.  In this case, there is no evidence of any invasive bacterial illness.  Most likely viral etiology so will hold on antibiotic treatment.  Advise supportive care with rest, encourage fluids, good hygiene and watch for any worsening symptoms.  If they were to develop:  come back to the office or go to the emergency room if after hours.  Without high fever, severe dyspnea, lack of physical findings or other risk factors, I will hold on CBC at this time. I discussed that approximately 50% of patients with acute bronchitis have a cough that lasts up to three weeks, and 25% for over a month.  Tylenol, one to two tablets every four hours as needed for fever or myalgias.   No  aspirin.  Patient instructed to follow up in one week or sooner if symptoms worsen. Patient verbalized agreement and understanding of treatment plan.  P2:  hand washing and cover cough   Supportive treatment.   No evidence of invasive bacterial infection, non toxic and well hydrated.  This is most likely self limiting viral infection.  I do not see where any further testing or imaging is necessary at this time.   I will suggest supportive care, rest, good hygiene and encourage the patient to take adequate fluids.  The patient is to return to clinic or EMERGENCY ROOM if symptoms worsen or change significantly e.g. ear pain, fever, purulent discharge from ears or bleeding.  Exitcare handout on otitis media with effusion  given to patient.  Patient verbalized agreement and understanding of treatment plan.    Patient notified rapid strep negative.  Suspect Viral illness: no evidence of invasive bacterial infection, non toxic and well hydrated.  This is most likely self limiting viral infection.  I do not see where any further testing or imaging is necessary at this time.   I will suggest supportive care, rest, good hygiene and encourage the patient to take adequate fluids.  Does not require work excuse. flonase 1 spray each nostril BID prn, nasal saline 1-2 sprays each nostril prn q2h, motrin 800mg  po TID prn.  Discussed honey with lemon and salt water gargles for comfort also.  The patient is to return to clinic or EMERGENCY ROOM if symptoms worsen or change significantly e.g. fever, lethargy, SOB, wheezing.  Exitcare handout on viral illness given to patient.  Patient verbalized agreement and understanding of treatment plan.    claritin 10mg  po daily if no relief start flonase 1 spray each nostril BID.  Nasal saline 2 sprays each nostril q2h prn congestion.  Patient may use normal saline nasal spray as needed.  Consider antihistamine or nasal steroid use.  Avoid triggers if possible.  Shower prior to bedtime if exposed to triggers.  If allergic dust/dust mites recommend mattress/pillow covers/encasements; washing linens, vacuuming, sweeping, dusting weekly.  Call or return to clinic as needed if these symptoms worsen or fail to improve as anticipated.   Exitcare handout on allergic rhinitis given to patient.  Patient verbalized understanding of instructions, agreed with plan of care and had no further questions at this time.  P2:  Avoidance and hand washing.  Bronchitis simple, community acquired, may have started as viral (probably respiratory syncytial, parainfluenza, influenza, or adenovirus), but now evidence of acute purulent bronchitis with resultant bronchial edema and mucus formation.  Viruses are the most common  cause of bronchial inflammation in otherwise healthy adults with acute bronchitis.  The appearance of sputum is not predictive of whether a bacterial infection is present.  Purulent sputum is most often caused by viral infections.  There are a small portion of those caused by non-viral agents being Mycoplamsa pneumonia.  Microscopic examination or C&S of sputum in the healthy adult with acute bronchitis is generally not helpful (usually negative or normal respiratory flora) other considerations being cough from upper respiratory tract infections, sinusitis or allergic syndromes (mild asthma or viral pneumonia).  Differential Diagnosis:  reactive airway disease (asthma, allergic aspergillosis (eosinophilia), chronic bronchitis, respiratory infection (Sinusitis, Common cold, pneumonia), congestive heart failure, reflux esophagitis, bronchogenic tumor, aspiration syndromes and/or exposure irritants/tobacco smoke.  In this case, there is no evidence of any invasive bacterial illness.  Most likely viral etiology so will hold on antibiotic treatment.  Advise supportive care  with rest, encourage fluids, good hygiene and watch for any worsening symptoms.  If they were to develop:  come back to the office or go to the emergency room if after hours.  Without high fever, severe dyspnea, lack of physical findings or other risk factors, I will hold on CBC at this time. I discussed that approximately 50% of patients with acute bronchitis have a cough that lasts up to three weeks, and 25% for over a month.  Tylenol, one to two tablets every four hours as needed for fever or myalgias.   No aspirin.  Patient instructed to follow up in one week or sooner if symptoms worsen. Patient verbalized agreement and understanding of treatment plan.  P2:  hand washing and cover cough   Olen Cordial, NP 02/28/16 1151

## 2016-02-28 NOTE — Telephone Encounter (Signed)
Staff message sent to [facility]

## 2016-02-28 NOTE — Discharge Instructions (Signed)
Acute Bronchitis Bronchitis is inflammation of the airways that extend from the windpipe into the lungs (bronchi). The inflammation often causes mucus to develop. This leads to a cough, which is the most common symptom of bronchitis.  In acute bronchitis, the condition usually develops suddenly and goes away over time, usually in a couple weeks. Smoking, allergies, and asthma can make bronchitis worse. Repeated episodes of bronchitis may cause further lung problems.  CAUSES Acute bronchitis is most often caused by the same virus that causes a cold. The virus can spread from person to person (contagious) through coughing, sneezing, and touching contaminated objects. SIGNS AND SYMPTOMS  1. Cough.  2. Fever.  3. Coughing up mucus.  4. Body aches.  5. Chest congestion.  6. Chills.  7. Shortness of breath.  8. Sore throat.  DIAGNOSIS  Acute bronchitis is usually diagnosed through a physical exam. Your health care provider will also ask you questions about your medical history. Tests, such as chest X-rays, are sometimes done to rule out other conditions.  TREATMENT  Acute bronchitis usually goes away in a couple weeks. Oftentimes, no medical treatment is necessary. Medicines are sometimes given for relief of fever or cough. Antibiotic medicines are usually not needed but may be prescribed in certain situations. In some cases, an inhaler may be recommended to help reduce shortness of breath and control the cough. A cool mist vaporizer may also be used to help thin bronchial secretions and make it easier to clear the chest.  HOME CARE INSTRUCTIONS 1. Get plenty of rest.  2. Drink enough fluids to keep your urine clear or pale yellow (unless you have a medical condition that requires fluid restriction). Increasing fluids may help thin your respiratory secretions (sputum) and reduce chest congestion, and it will prevent dehydration.  3. Take medicines only as directed by your health care  provider. 4. If you were prescribed an antibiotic medicine, finish it all even if you start to feel better. 5. Avoid smoking and secondhand smoke. Exposure to cigarette smoke or irritating chemicals will make bronchitis worse. If you are a smoker, consider using nicotine gum or skin patches to help control withdrawal symptoms. Quitting smoking will help your lungs heal faster.  6. Reduce the chances of another bout of acute bronchitis by washing your hands frequently, avoiding people with cold symptoms, and trying not to touch your hands to your mouth, nose, or eyes.  7. Keep all follow-up visits as directed by your health care provider.  SEEK MEDICAL CARE IF: Your symptoms do not improve after 1 week of treatment.  SEEK IMMEDIATE MEDICAL CARE IF:  You develop an increased fever or chills.   You have chest pain.   You have severe shortness of breath.  You have bloody sputum.   You develop dehydration.  You faint or repeatedly feel like you are going to pass out.  You develop repeated vomiting.  You develop a severe headache. MAKE SURE YOU:   Understand these instructions.  Will watch your condition.  Will get help right away if you are not doing well or get worse.   This information is not intended to replace advice given to you by your health care provider. Make sure you discuss any questions you have with your health care provider.   Document Released: 01/13/2005 Document Revised: 12/27/2014 Document Reviewed: 05/29/2013 Elsevier Interactive Patient Education 2016 [employer]  Acute Bronchitis Bronchitis is inflammation of the airways that extend from the windpipe into the lungs (bronchi). The  inflammation often causes mucus to develop. This leads to a cough, which is the most common symptom of bronchitis.  In acute bronchitis, the condition usually develops suddenly and goes away over time, usually in a couple weeks. Smoking, allergies, and asthma can make  bronchitis worse. Repeated episodes of bronchitis may cause further lung problems.  CAUSES Acute bronchitis is most often caused by the same virus that causes a cold. The virus can spread from person to person (contagious) through coughing, sneezing, and touching contaminated objects. SIGNS AND SYMPTOMS  9. Cough.  10. Fever.  11. Coughing up mucus.  12. Body aches.  13. Chest congestion.  14. Chills.  15. Shortness of breath.  16. Sore throat.  DIAGNOSIS  Acute bronchitis is usually diagnosed through a physical exam. Your health care provider will also ask you questions about your medical history. Tests, such as chest X-rays, are sometimes done to rule out other conditions.  TREATMENT  Acute bronchitis usually goes away in a couple weeks. Oftentimes, no medical treatment is necessary. Medicines are sometimes given for relief of fever or cough. Antibiotic medicines are usually not needed but may be prescribed in certain situations. In some cases, an inhaler may be recommended to help reduce shortness of breath and control the cough. A cool mist vaporizer may also be used to help thin bronchial secretions and make it easier to clear the chest.  HOME CARE INSTRUCTIONS 8. Get plenty of rest.  9. Drink enough fluids to keep your urine clear or pale yellow (unless you have a medical condition that requires fluid restriction). Increasing fluids may help thin your respiratory secretions (sputum) and reduce chest congestion, and it will prevent dehydration.  10. Take medicines only as directed by your health care provider. 11. If you were prescribed an antibiotic medicine, finish it all even if you start to feel better. 12. Avoid smoking and secondhand smoke. Exposure to cigarette smoke or irritating chemicals will make bronchitis worse. If you are a smoker, consider using nicotine gum or skin patches to help control withdrawal symptoms. Quitting smoking will help your lungs heal faster.   13. Reduce the chances of another bout of acute bronchitis by washing your hands frequently, avoiding people with cold symptoms, and trying not to touch your hands to your mouth, nose, or eyes.  41. Keep all follow-up visits as directed by your health care provider.  SEEK MEDICAL CARE IF: Your symptoms do not improve after 1 week of treatment.  SEEK IMMEDIATE MEDICAL CARE IF:  You develop an increased fever or chills.   You have chest pain.   You have severe shortness of breath.  You have bloody sputum.   You develop dehydration.  You faint or repeatedly feel like you are going to pass out.  You develop repeated vomiting.  You develop a severe headache. MAKE SURE YOU:   Understand these instructions.  Will watch your condition.  Will get help right away if you are not doing well or get worse.   This information is not intended to replace advice given to you by your health care provider. Make sure you discuss any questions you have with your health care provider.   Document Released: 01/13/2005 Document Revised: 12/27/2014 Document Reviewed: 05/29/2013 Elsevier Interactive Patient Education 2016 [employer]. Otitis Media With Effusion Otitis media with effusion is the presence of fluid in the middle ear. This is a common problem in children, which often follows ear infections. It may be present for weeks or  longer after the infection. Unlike an acute ear infection, otitis media with effusion refers only to fluid behind the ear drum and not infection. Children with repeated ear and sinus infections and allergy problems are the most likely to get otitis media with effusion. CAUSES  The most frequent cause of the fluid buildup is dysfunction of the eustachian tubes. These are the tubes that drain fluid in the ears to the back of the nose (nasopharynx). SYMPTOMS  17. The main symptom of this condition is hearing loss. As a result, you or your child may: 1. Listen to the TV  at a loud volume. 2. Not respond to questions. 3. Ask "what" often when spoken to. 4. Mistake or confuse one sound or word for another. 18. There may be a sensation of fullness or pressure but usually not pain. DIAGNOSIS  15. Your health care provider will diagnose this condition by examining you or your child's ears. 15. Your health care provider may test the pressure in you or your child's ear with a tympanometer. 17. A hearing test may be conducted if the problem persists. TREATMENT   Treatment depends on the duration and the effects of the effusion.  Antibiotics, decongestants, nose drops, and cortisone-type drugs (tablets or nasal spray) may not be helpful.  Children with persistent ear effusions may have delayed language or behavioral problems. Children at risk for developmental delays in hearing, learning, and speech may require referral to a specialist earlier than children not at risk.  You or your child's health care provider may suggest a referral to an ear, nose, and throat surgeon for treatment. The following may help restore normal hearing:  Drainage of fluid.  Placement of ear tubes (tympanostomy tubes).  Removal of adenoids (adenoidectomy). HOME CARE INSTRUCTIONS   Avoid secondhand smoke.  Infants who are breastfed are less likely to have this condition.  Avoid feeding infants while they are lying flat.  Avoid known environmental allergens.  Avoid people who are sick. SEEK MEDICAL CARE IF:   Hearing is not better in 3 months.  Hearing is worse.  Ear pain.  Drainage from the ear.  Dizziness. MAKE SURE YOU:   Understand these instructions.  Will watch your condition.  Will get help right away if you are not doing well or get worse.   This information is not intended to replace advice given to you by your health care provider. Make sure you discuss any questions you have with your health care provider.   Document Released: 01/13/2005 Document  Revised: 12/27/2014 Document Reviewed: 07/03/2013 Elsevier Interactive Patient Education 2016 [employer]. How to Use a Nebulizer If you have asthma or other breathing problems, you might need to breathe in (inhale) medicine. This can be done with a nebulizer. A nebulizer is a device that turns liquid medicine into a mist that you can inhale.  There are different kinds of nebulizers. Most are small. With some, you breathe in through a mouthpiece. With others, a mask fits over your nose and mouth. Most nebulizers must be connected to a small air compressor. Air is forced through tubing from the compressor to the nebulizer. The forced air changes the liquid into a fine spray. RISKS AND COMPLICATIONS The nebulizer must work properly for it to help your breathing. If the nebulizer does not produce mist, or if foam comes out, this indicates that the nebulizer is not working properly. Sometimes a filter can get clogged, or there might be a problem with the air compressor. Check  the instruction booklet that came with your nebulizer. It should tell you how to fix problems or where to call for help. You should have at least one extra nebulizer at home. That way, you will always have one when you need it.  HOW TO PREPARE BEFORE USING THE NEBULIZER Take these steps before using the nebulizer: 19. Check your medicine. Make sure it has not expired and is not damaged in any way.  44. Wash your hands with soap and water.  21. Put all the parts of your nebulizer on a sturdy, flat surface. Make sure the tubing connects the compressor and the nebulizer. 22. Measure the liquid medicine according to your health care provider's instructions. Pour it into the nebulizer. 23. Attach the mouthpiece or mask.  24. Test the nebulizer by turning it on to make sure a spray is coming out. Then, turn it off.  HOW TO USE THE NEBULIZER 18. Sit down and focus on staying relaxed.  19. If your nebulizer has a mask, put it over  your nose and mouth. If you use a mouthpiece, put it in your mouth. Press your lips firmly around the mouthpiece. 20. Turn on the nebulizer.  21. Breathe out.  22. Some nebulizers have a finger valve. If yours does, cover up the air hole so the air gets to the nebulizer. 23. Once the medicine begins to mist out, take slow, deep breaths. If there is a finger valve, release it at the end of your breath. 24. Continue taking slow, deep breaths until the nebulizer is empty.  Be sure to stop the machine at any point if you start coughing or if the medicine foams or bubbles. HOW TO CLEAN THE NEBULIZER  The nebulizer and all its parts must be kept very clean. Follow the manufacturer's instructions for cleaning. For most nebulizers, you should follow these guidelines:  Wash the nebulizer after each use. Use warm water and soap. Rinse it well. Shake the nebulizer to remove extra water. Put it on a clean towel until it is completely dry. To make sure it is dry, put the nebulizer back together. Turn on the compressor for a few minutes. This will blow air through the nebulizer.   Do not wash the tubing or the finger valve.   Store the nebulizer in a dust-free place.   Inspect the filter every week. Replace it any time it looks dirty.   Sometimes the nebulizer will need a more complete cleaning. The instruction booklet should say how often you need to do this. SEEK MEDICAL CARE IF:   You continue to have difficulty breathing.   You have trouble using the nebulizer.    This information is not intended to replace advice given to you by your health care provider. Make sure you discuss any questions you have with your health care provider.   Document Released: 11/24/2009 Document Revised: 12/27/2014 Document Reviewed: 05/28/2013 Elsevier Interactive Patient Education 2016 Elsevier Inc. Metered Dose Inhaler (No Spacer Used) Inhaled medicines are the basis of treatment for asthma and other  breathing problems. Inhaled medicine can only be effective if used properly. Good technique assures that the medicine reaches the lungs. Metered dose inhalers (MDIs) are used to deliver a variety of inhaled medicines. These include quick relief or rescue medicines (such as bronchodilators) and controller medicines (such as corticosteroids). The medicine is delivered by pushing down on a metal canister to release a set amount of spray. If you are using different kinds of inhalers, use  your quick relief medicine to open the airways 10-15 minutes before using a steroid, if instructed to do so by your health care provider. If you are unsure which inhalers to use and the order of using them, ask your health care provider, nurse, or respiratory therapist. HOW TO USE THE INHALER 25. Remove the cap from the inhaler. 26. If you are using the inhaler for the first time, you will need to prime it. Shake the inhaler for 5 seconds and release four puffs into the air, away from your face. Ask your health care provider or pharmacist if you have questions about priming your inhaler. 27. Shake the inhaler for 5 seconds before each breath in (inhalation). 28. Position the inhaler so that the top of the canister faces up. 29. Put your index finger on the top of the medicine canister. Your thumb supports the bottom of the inhaler. 30. Open your mouth. 31. Either place the inhaler between your teeth and place your lips tightly around the mouthpiece, or hold the inhaler 1-2 inches away from your open mouth. If you are unsure of which technique to use, ask your health care provider. 32. Breathe out (exhale) normally and as completely as possible. 33. Press the canister down with the index finger to release the medicine. 34. At the same time as the canister is pressed, inhale deeply and slowly until your lungs are completely filled. This should take 4-6 seconds. Keep your tongue down. 35. Hold the medicine in your lungs for  5-10 seconds (10 seconds is best). This helps the medicine get into the small airways of your lungs. 36. Breathe out slowly, through pursed lips. Whistling is an example of pursed lips. 37. Wait at least 1 minute between puffs. Continue with the above steps until you have taken the number of puffs your health care provider has ordered. Do not use the inhaler more than your health care provider directs you to. 38. Replace the cap on the inhaler. 39. Follow the directions from your health care provider or the inhaler insert for cleaning the inhaler. If you are using a steroid inhaler, after your last puff, rinse your mouth with water, gargle, and spit out the water. Do not swallow the water. AVOID: 25. Inhaling before or after starting the spray of medicine. It takes practice to coordinate your breathing with triggering the spray. 26. Inhaling through the nose (rather than the mouth) when triggering the spray. HOW TO DETERMINE IF YOUR INHALER IS FULL OR NEARLY EMPTY You cannot know when an inhaler is empty by shaking it. Some inhalers are now being made with dose counters. Ask your health care provider for a prescription that has a dose counter if you feel you need that extra help. If your inhaler does not have a counter, ask your health care provider to help you determine the date you need to refill your inhaler. Write the refill date on a calendar or your inhaler canister. Refill your inhaler 7-10 days before it runs out. Be sure to keep an adequate supply of medicine. This includes making sure it has not expired, and making sure you have a spare inhaler. SEEK MEDICAL CARE IF:  Symptoms are only partially relieved with your inhaler.  You are having trouble using your inhaler.  You experience an increase in phlegm. SEEK IMMEDIATE MEDICAL CARE IF:  You feel little or no relief with your inhalers. You are still wheezing and feeling shortness of breath, tightness in your chest, or both.  You  have  dizziness, headaches, or a fast heart rate.  You have chills, fever, or night sweats.  There is a noticeable increase in phlegm production, or there is blood in the phlegm. MAKE SURE YOU:  Understand these instructions.  Will watch your condition.  Will get help right away if you are not doing well or get worse.   This information is not intended to replace advice given to you by your health care provider. Make sure you discuss any questions you have with your health care provider.   Document Released: 10/03/2007 Document Revised: 12/27/2014 Document Reviewed: 05/24/2013 Elsevier Interactive Patient Education [employer].

## 2016-02-28 NOTE — ED Notes (Signed)
Patient c/o cough, chest congestion, SOB and left sided back pain for 3 weeks.  Patient denies fevers.

## 2016-03-02 ENCOUNTER — Ambulatory Visit (INDEPENDENT_AMBULATORY_CARE_PROVIDER_SITE_OTHER): Payer: BLUE CROSS/BLUE SHIELD | Admitting: Family Medicine

## 2016-03-02 ENCOUNTER — Encounter: Payer: Self-pay | Admitting: Family Medicine

## 2016-03-02 VITALS — BP 129/66 | HR 58 | Temp 98.4°F | Resp 16 | Ht 68.5 in | Wt 152.8 lb

## 2016-03-02 DIAGNOSIS — Z23 Encounter for immunization: Secondary | ICD-10-CM

## 2016-03-02 DIAGNOSIS — E7800 Pure hypercholesterolemia, unspecified: Secondary | ICD-10-CM | POA: Diagnosis not present

## 2016-03-02 DIAGNOSIS — Z86010 Personal history of colonic polyps: Secondary | ICD-10-CM

## 2016-03-02 DIAGNOSIS — E119 Type 2 diabetes mellitus without complications: Secondary | ICD-10-CM

## 2016-03-02 DIAGNOSIS — R5383 Other fatigue: Secondary | ICD-10-CM | POA: Diagnosis not present

## 2016-03-02 LAB — COMPREHENSIVE METABOLIC PANEL
ALBUMIN: 4.4 g/dL (ref 3.6–5.1)
ALK PHOS: 63 U/L (ref 40–115)
ALT: 13 U/L (ref 9–46)
AST: 12 U/L (ref 10–35)
BILIRUBIN TOTAL: 0.4 mg/dL (ref 0.2–1.2)
BUN: 15 mg/dL (ref 7–25)
CALCIUM: 9.6 mg/dL (ref 8.6–10.3)
CO2: 33 mmol/L — AB (ref 20–31)
CREATININE: 0.86 mg/dL (ref 0.70–1.25)
Chloride: 99 mmol/L (ref 98–110)
Glucose, Bld: 96 mg/dL (ref 65–99)
Potassium: 4.1 mmol/L (ref 3.5–5.3)
SODIUM: 142 mmol/L (ref 135–146)
TOTAL PROTEIN: 6.5 g/dL (ref 6.1–8.1)

## 2016-03-02 LAB — CBC WITH DIFFERENTIAL/PLATELET
BASOS PCT: 1 % (ref 0–1)
Basophils Absolute: 0.1 10*3/uL (ref 0.0–0.1)
Eosinophils Absolute: 0.1 10*3/uL (ref 0.0–0.7)
Eosinophils Relative: 1 % (ref 0–5)
HEMATOCRIT: 42.7 % (ref 39.0–52.0)
HEMOGLOBIN: 14.3 g/dL (ref 13.0–17.0)
LYMPHS PCT: 32 % (ref 12–46)
Lymphs Abs: 2.5 10*3/uL (ref 0.7–4.0)
MCH: 29.6 pg (ref 26.0–34.0)
MCHC: 33.5 g/dL (ref 30.0–36.0)
MCV: 88.4 fL (ref 78.0–100.0)
MONO ABS: 0.8 10*3/uL (ref 0.1–1.0)
MONOS PCT: 10 % (ref 3–12)
MPV: 9.7 fL (ref 8.6–12.4)
NEUTROS ABS: 4.3 10*3/uL (ref 1.7–7.7)
NEUTROS PCT: 56 % (ref 43–77)
Platelets: 231 10*3/uL (ref 150–400)
RBC: 4.83 MIL/uL (ref 4.22–5.81)
RDW: 13.3 % (ref 11.5–15.5)
WBC: 7.7 10*3/uL (ref 4.0–10.5)

## 2016-03-02 LAB — CK: CK TOTAL: 52 U/L (ref 7–232)

## 2016-03-02 LAB — LIPID PANEL
CHOLESTEROL: 140 mg/dL (ref 125–200)
HDL: 73 mg/dL (ref >=40)
LDL Cholesterol: 53 mg/dL (ref <130)
Total CHOL/HDL Ratio: 1.9 Ratio (ref <=5.0)
Triglycerides: 71 mg/dL (ref <150)
VLDL: 14 mg/dL (ref <30)

## 2016-03-02 NOTE — Progress Notes (Signed)
Subjective:    Patient ID: Leslie Sims, male    DOB: [birth year], 65 y.o.   MRN: [id number]  03/02/2016  Follow-up   HPI This 65 y.o. male presents for four month follow-up:   1.  DMII: HgbA1c 6.5.  Taking Metformin 500mg  one tablet daily.   2.  Hyperlipidemia: Patient reports good compliance with medication, good tolerance to medication, and good symptom control.    3.  Acute bronchitis with COPD exacerbation:  Evaluated by Urgent Care 02/28/2015; prescribed Doxycycline, Prednisone, Tessalon Perles, Tussionex.  S/p CXR negative.  Mold exposure; started three weeks ago; friend was working on old house; no tearing out of house; had someone else tear out old sheet rock.  Then started getting sick; friend also could not breathe well.  Then, pt could not breathe well.  Covered dirt up with plastic; then a lot of moisture.  No masks while renovating.    4.  Anxiety and depression: Patient reports good compliance with medication, good tolerance to medication, and good symptom control.    5.  Tobacco abuse: 1.5-2ppd.    6.  GERD: stomach started burning a lot; started taking Carafate;   7.  Aches all over: hurts all over; tired all the time.  Wants to go to bed when aching.  Joints hurt.  No muscles.    8. Allergic rhinitis: s/p allergy testing. Insurance changed after visit; going to start allergy shots.     Review of Systems  Constitutional: Negative for fever, chills, diaphoresis, activity change, appetite change and fatigue.  Respiratory: Negative for cough and shortness of breath.   Cardiovascular: Negative for chest pain, palpitations and leg swelling.  Gastrointestinal: Negative for nausea, vomiting, abdominal pain and diarrhea.  Endocrine: Negative for cold intolerance, heat intolerance, polydipsia, polyphagia and polyuria.  Skin: Negative for color change, rash and wound.  Neurological: Negative for dizziness, tremors, seizures, syncope, facial asymmetry, speech  difficulty, weakness, light-headedness, numbness and headaches.  Psychiatric/Behavioral: Negative for sleep disturbance and dysphoric mood. The patient is not nervous/anxious.     Past Medical History  Diagnosis Date  . Hyperlipidemia   . COPD (chronic obstructive pulmonary disease) ([place])   . Depression   . GERD (gastroesophageal reflux disease)   . Colon polyps 08/2012    colonoscopy; multiple colon polyps; repeat colonoscopy in one year.  . Status post dilation of esophageal narrowing 2000  . Peptic ulcer   . Hypertension   . CARDIOMYOPATHY 03/24/2010    did not tolerate Toprol and Lisinopril.   . Insomnia   . Hemorrhoids   . MI (myocardial infarction) ([place])   . Atherosclerosis   . Anxiety   . Headache(784.0)   . Tinea pedis   . Myalgia   . Helicobacter pylori (H. pylori)   . Thyroid dysfunction   . Chest pain   . Back pain   . Hernia     inguinal  . Allergic rhinitis   . Bronchitis     09-14-11  . Chronic airway obstruction, not elsewhere classified   . Coronary artery disease 11/2009    Anterior MI with late presentation 11/2009. Cath showed a 100% proximal LAD occlusion, 99% mid RCA. Had a PCI and 2 DES to both LAD and RCA. EF was 35%. Nuclear stress test 05/13: prior anterior/inferior infarcts without ischemic, EF 43%, cardiac cath in 02/14: Widely patent stents with no other obstructive disease. EF 40%    Past Surgical History  Procedure Laterality Date  . Penile prosthesis implant    .  Esophagogastroduodenoscopy  2008  . Colonoscopy    . Esophageal dilation    . Hernia repair  07/20/2012    L inguinal hernia repair  . Admission  12/20/2012    COPD exacerbation.  [place].  . Esophagogastroduodenoscopy  08/20/2012  . Cardiac catheterization    . Coronary angioplasty with stent placement  09/2009    LAD 3.0 X23 mm Xience DES, RCA: 4.0 X 15 mm Xience DES  . Cardiac catheterization  02/05/13    [facility]  . Cardiac catheterization  10/14    [facility] : patent stents with no change  in anatomy. EF: 40$   Allergies  Allergen Reactions  . Wellbutrin [Bupropion] Other (See Comments)    Unknown/"made me feel real funny"    Social History   Social History  . Marital Status: Married    Spouse Name: N/A  . Number of Children: 5  . 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 Smoking status: Current Every Day Smoker -- 1.50 packs/day for 42 years    Types: Cigarettes  . Smokeless tobacco: Never Used  . Alcohol Use: No  . Drug Use: No  . Sexual Activity: Yes   Other Topics Concern  . Not on file   Social History Narrative   Marital status: married x 32 years; happily married.      Children: 4 children;  1 stepchild and 15 grandchildren; no great grandchildren.      Lives: with wife; 2 dogs, 1 cat.      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      Tobacco: 1 ppd x 45 years      Alcohol: none; previous alcoholism      Drugs: none      Exercise: no formal exercise; physically demanding job; owns horses.      Seatbelts:  100%      Guns: one loaded unsecured gun in the home.      Sunscreen: none   Family History  Problem Relation Age of Onset  . Heart attack Brother     Brother #1  . Diabetes Brother     brother #2  . Hypertension Brother     #3  . Coronary artery disease Father 55    deceased  . Heart attack Father   . Diabetes Father   . Heart disease Father   . COPD Mother 51    deceased  . Heart attack Sister 33    acute-cocaine abuse/deceased  . Alcohol abuse Sister     polysubstance abuse  . COPD Sister   . Alcohol abuse Sister     polysubstance abuse  . Heart disease Brother     AMI x multiple       Objective:    BP 129/66 mmHg  Pulse 58  Temp(Src) 98.4 F (36.9 C) (Oral)  Resp 16  Ht 5' 8.5" (1.74 m)  Wt 152 lb 12.8 oz (69.31 kg)  BMI 22.89 kg/m2 Physical Exam  Constitutional: He is oriented to person, place, and time. He appears  well-developed and well-nourished. No distress.  HENT:  Head: Normocephalic and atraumatic.  Right Ear: External ear normal.  Left Ear: External ear normal.  Nose: Nose normal.  Mouth/Throat: Oropharynx is clear and moist.  Eyes: Conjunctivae and EOM are normal. Pupils are equal, round, and reactive to light.  Neck: Normal range of motion. Neck supple. Carotid bruit is not  present. No thyromegaly present.  Cardiovascular: Normal rate, regular rhythm, normal heart sounds and intact distal pulses.  Exam reveals no gallop and no friction rub.   No murmur heard. Pulmonary/Chest: Effort normal and breath sounds normal. He has no wheezes. He has no rales.  Abdominal: Soft. Bowel sounds are normal. He exhibits no distension and no mass. There is no tenderness. There is no rebound and no guarding.  Lymphadenopathy:    He has no cervical adenopathy.  Neurological: He is alert and oriented to person, place, and time. No cranial nerve deficit.  Skin: Skin is warm and dry. No rash noted. He is not diaphoretic.  Psychiatric: He has a normal mood and affect. His behavior is normal.  Nursing note and vitals reviewed.       Assessment & Plan:   1. Type 2 diabetes mellitus without complication, without long-term current use of insulin ([place])   2. Pure hypercholesterolemia   3. Need for prophylactic vaccination and inoculation against influenza   4. Other fatigue   5. History of colonic polyps     Orders Placed This Encounter  Procedures  . Flu Vaccine QUAD 36+ mos IM  . CBC with Differential/Platelet  . Comprehensive metabolic panel    Order Specific Question:  Has the patient fasted?    Answer:  Yes  . Hemoglobin A1c  . Lipid panel    Order Specific Question:  Has the patient fasted?    Answer:  Yes  . CK  . Vitamin B12  . VITAMIN D 25 Hydroxy (Vit-D Deficiency, Fractures)  . Ambulatory referral to Gastroenterology    Referral Priority:  Routine    Referral Type:  Consultation     Referral Reason:  Specialty Services Required    Number of Visits Requested:  1   No orders of the defined types were placed in this encounter.    Return in about 4 months (around 07/02/2016) for recheck.    Brynda Heick Elayne Guerin, M.D. [facility] [address] [place], [place]  [address] [phone] phone [phone] fax

## 2016-03-02 NOTE — Patient Instructions (Signed)
     IF you received an x-ray today, you will receive an invoice from [facility]. Please contact [facility] at [phone] with questions or concerns regarding your invoice.   IF you received labwork today, you will receive an invoice from [facility]. Please contact [facility] at [phone] with questions or concerns regarding your invoice.   Our billing staff will not be able to assist you with questions regarding bills from these companies.  You will be contacted with the lab results as soon as they are available. The fastest way to get your results is to activate your My Chart account. Instructions are located on the last page of this paperwork. If you have not heard from us regarding the results in 2 weeks, please contact this office.      

## 2016-03-03 LAB — HEMOGLOBIN A1C
HEMOGLOBIN A1C: 6.1 % — AB (ref <5.7)
MEAN PLASMA GLUCOSE: 128 mg/dL — AB (ref <117)

## 2016-03-03 LAB — VITAMIN D 25 HYDROXY (VIT D DEFICIENCY, FRACTURES): VIT D 25 HYDROXY: 23 ng/mL — AB (ref 30–100)

## 2016-03-03 LAB — VITAMIN B12: VITAMIN B 12: 566 pg/mL (ref 200–1100)

## 2016-03-04 ENCOUNTER — Other Ambulatory Visit: Payer: Self-pay

## 2016-03-04 NOTE — Telephone Encounter (Signed)
PA needed for both medication esomeprazole and then quantity limit over ride for BID dosing. I called and Lm for pt to CB w/hx of other GERD meds tried in past. I see in EPIC notes that pt has tried BID dosing of omeprazole 40 mg, and also QD dosing of esomeprazole. Pt also uses Carafate. Need to know if pt has tried/failed any other PPIs and also Pepcid/Zantac.

## 2016-03-05 NOTE — Telephone Encounter (Signed)
Pt called back and stated that he has also tried prevacid and pantoprazole, and the Nexium brand and generic have worked the best for him with BID dosing. He has also tried the Pepcid and Zantac. Completed PA on the phone. Pending.

## 2016-03-11 ENCOUNTER — Telehealth: Payer: Self-pay | Admitting: Gastroenterology

## 2016-03-11 NOTE — Telephone Encounter (Signed)
colonoscopy

## 2016-03-12 ENCOUNTER — Other Ambulatory Visit: Payer: Self-pay

## 2016-03-12 NOTE — Telephone Encounter (Signed)
Gastroenterology Pre-Procedure Review  Request Date: 04/09/16 Requesting Physician: Dr. [name]  PATIENT REVIEW QUESTIONS: The patient responded to the following health history questions as indicated:    1. Are you having any GI issues? no 2. Do you have a personal history of Polyps? yes (5 years) 3. Do you have a family history of Colon Cancer or Polyps? no 4. Diabetes Mellitus? no 5. Joint replacements in the past 12 months?no 6. Major health problems in the past 3 months?no 7. Any artificial heart valves, MVP, or defibrillator?yes (Stent x 6 years)    MEDICATIONS & ALLERGIES:    Patient reports the following regarding taking any anticoagulation/antiplatelet therapy:   Plavix, Coumadin, Eliquis, Xarelto, Lovenox, Pradaxa, Brilinta, or Effient? yes (Plavix 75mg ) Aspirin? yes (ASA 81mg )  Patient confirms/reports the following medications:  Current Outpatient Prescriptions  Medication Sig Dispense Refill  . ADVAIR DISKUS 500-50 MCG/DOSE AEPB USE 2 INHALATIONS DAILY 180 each 2  . albuterol (PROVENTIL) (2.5 MG/3ML) 0.083% nebulizer solution Take 3 mLs (2.5 mg total) by nebulization every 6 (six) hours as needed for wheezing or shortness of breath. 360 vial 4  . albuterol (VENTOLIN HFA) 108 (90 BASE) MCG/ACT inhaler Inhale 2 puffs into the lungs every 6 (six) hours as needed. For wheezing 54 g 11  . aspirin 81 MG tablet Take 81 mg by mouth daily.      [name] atorvastatin (LIPITOR) 40 MG tablet Take 1 tablet (40 mg total) by mouth daily. 90 tablet 3  . benzonatate (TESSALON) 200 MG capsule Take 1 capsule (200 mg total) by mouth 3 (three) times daily as needed for cough. (Patient not taking: Reported on 03/02/2016) 20 capsule 0  . clopidogrel (PLAVIX) 75 MG tablet Take 1 tablet (75 mg total) by mouth daily. 90 tablet 3  . CORTIFOAM 10 % rectal foam PLACE ONE APPLICATION RECTALLY TWICE DAILY 15 g 5  . doxycycline (VIBRAMYCIN) 100 MG capsule Take 1 capsule (100 mg total) by mouth 2 (two) times  daily. 20 capsule 0  . esomeprazole (NEXIUM) 40 MG capsule Take 1 capsule (40 mg total) by mouth 2 (two) times daily before a meal. (Patient not taking: Reported on 03/02/2016) 180 capsule 3  . fluticasone (FLONASE) 50 MCG/ACT nasal spray Place 1 spray into both nostrils 2 (two) times daily. 16 g 0  . guaiFENesin-codeine (ROBITUSSIN AC) 100-10 MG/5ML syrup Take 5 mLs by mouth at bedtime as needed for cough or congestion (do not take ativan if taking cheratussin). (Patient not taking: Reported on 03/02/2016) 118 mL 0  . loratadine (CLARITIN) 10 MG tablet Take 1 tablet (10 mg total) by mouth daily. (Patient not taking: Reported on 03/02/2016) 30 tablet 0  . LORazepam (ATIVAN) 0.5 MG tablet Take 1 tablet (0.5 mg total) by mouth daily as needed for anxiety. 30 tablet 3  . losartan (COZAAR) 25 MG tablet Take 1 tablet (25 mg total) by mouth daily. 90 tablet 3  . metFORMIN (GLUCOPHAGE) 500 MG tablet Take 1 tablet (500 mg total) by mouth daily after supper. 90 tablet 1  . nitroGLYCERIN (NITROSTAT) 0.4 MG SL tablet Place 1 tablet (0.4 mg total) under the tongue every 5 (five) minutes as needed. 25 tablet 3  . predniSONE (DELTASONE) 20 MG tablet Take 2 tablets (40 mg total) by mouth daily with breakfast. 10 tablet 0  . sertraline (ZOLOFT) 100 MG tablet Take 1-2 tablets (100-200 mg total) by mouth daily. 180 tablet 3  . sodium chloride (OCEAN) 0.65 % SOLN nasal spray Place 2  sprays into both nostrils every 2 (two) hours while awake. (Patient not taking: Reported on 03/02/2016)  0  . SPIRIVA HANDIHALER 18 MCG inhalation capsule PLACE 1 CAPSULE INTO INHALER AND INHALE CONTENTS OF 1 CAPSULE DAILY 90 capsule 2  . sucralfate (CARAFATE) 1 GM/10ML suspension TAKE 10ML( 2 TEASPOONSFUL) BY MOUTH FOUR TIMES DAILY 3600 mL 1   No current facility-administered medications for this visit.    Patient confirms/reports the following allergies:  Allergies  Allergen Reactions  . Wellbutrin [Bupropion] Other (See Comments)     Unknown/"made me feel real funny"    No orders of the defined types were placed in this encounter.    AUTHORIZATION INFORMATION Primary Insurance: 1D#: Group #:  Secondary Insurance: 1D#: Group #:  SCHEDULE INFORMATION: Date: 04/09/16 Time: Location: [place]

## 2016-03-12 NOTE — Telephone Encounter (Signed)
Pt scheduled for colonoscopy at [facility] on 04/09/16. Hx of colon polyps ([id number]) Please precert insurance.

## 2016-03-15 MED ORDER — PANTOPRAZOLE SODIUM 40 MG PO TBEC: 40.0000 mg | DELAYED_RELEASE_TABLET | Freq: Two times a day (BID) | ORAL | Status: DC

## 2016-03-15 NOTE — Telephone Encounter (Signed)
[facility] advised that PA was denied because they need documentation of trial and failure of 80 mg QD of pantoprazole. Dr Tamala Julian, do you want to Rx this for pt? Pended.

## 2016-03-15 NOTE — Telephone Encounter (Signed)
I approved Pantoprazole 40mg  bid for patient; please advise pt.

## 2016-03-15 NOTE — Telephone Encounter (Signed)
Pt called to check status of PA. I have not received any answer from ins yet. Advised pt that if I don't receive a decision today in the faxes from over the weekend, I will call and check w/ins.

## 2016-03-16 MED ORDER — PANTOPRAZOLE SODIUM 40 MG PO TBEC: 40.0000 mg | DELAYED_RELEASE_TABLET | Freq: Two times a day (BID) | ORAL | Status: DC

## 2016-03-16 NOTE — Telephone Encounter (Signed)
Called and advised pt of change. He agreed to try this, but asked that I re-send it to [facility]. Done.

## 2016-03-21 ENCOUNTER — Encounter: Payer: Self-pay | Admitting: Family Medicine

## 2016-03-25 ENCOUNTER — Telehealth: Payer: Self-pay

## 2016-03-25 NOTE — Telephone Encounter (Signed)
Dr. Tamala Julian   Patient went to [facility] to pick up his doxycycline (VIBRAMYCIN) 100 MG capsule And it was not there for him.   [phone]

## 2016-03-29 ENCOUNTER — Ambulatory Visit: Payer: BLUE CROSS/BLUE SHIELD

## 2016-03-29 DIAGNOSIS — R0989 Other specified symptoms and signs involving the circulatory and respiratory systems: Secondary | ICD-10-CM

## 2016-03-29 DIAGNOSIS — R9089 Other abnormal findings on diagnostic imaging of central nervous system: Secondary | ICD-10-CM | POA: Diagnosis not present

## 2016-03-29 MED ORDER — DOXYCYCLINE HYCLATE 100 MG PO CAPS: 100.0000 mg | ORAL_CAPSULE | Freq: Two times a day (BID) | ORAL | Status: DC

## 2016-03-29 NOTE — Telephone Encounter (Signed)
Pt is taking his allergy meds and Flonase. Informed him that his Abx was called in. He understood.

## 2016-03-29 NOTE — Telephone Encounter (Signed)
Call patient -- Doxycycline called into pharmacy for sinusitis.  Please make sure that patient is taking allergy pill daily and Flonase daily.

## 2016-04-01 ENCOUNTER — Encounter: Payer: Self-pay | Admitting: *Deleted

## 2016-04-05 ENCOUNTER — Encounter: Payer: Self-pay | Admitting: Anesthesiology

## 2016-04-05 ENCOUNTER — Other Ambulatory Visit: Payer: Self-pay

## 2016-04-09 ENCOUNTER — Ambulatory Visit
Admission: RE | Admit: 2016-04-09 | Payer: BLUE CROSS/BLUE SHIELD | Source: Ambulatory Visit | Admitting: Gastroenterology

## 2016-04-09 ENCOUNTER — Telehealth: Payer: Self-pay | Admitting: Cardiovascular Disease

## 2016-04-09 HISTORY — DX: Presence of dental prosthetic device (complete) (partial): Z97.2

## 2016-04-09 SURGERY — COLONOSCOPY WITH PROPOFOL
Anesthesia: Choice

## 2016-04-09 NOTE — Telephone Encounter (Signed)
Faxed clearance for colonoscopy to [facility], [phone]

## 2016-04-20 ENCOUNTER — Encounter: Payer: Self-pay | Admitting: *Deleted

## 2016-04-20 ENCOUNTER — Ambulatory Visit
Admission: EM | Admit: 2016-04-20 | Discharge: 2016-04-20 | Disposition: A | Discharge status: Home / Self Care | Payer: BLUE CROSS/BLUE SHIELD | Attending: Family Medicine | Admitting: Family Medicine

## 2016-04-20 ENCOUNTER — Other Ambulatory Visit: Payer: Self-pay | Admitting: Family Medicine

## 2016-04-20 DIAGNOSIS — J441 Chronic obstructive pulmonary disease with (acute) exacerbation: Secondary | ICD-10-CM

## 2016-04-20 MED ORDER — PREDNISONE 20 MG PO TABS: ORAL_TABLET | ORAL | Status: DC

## 2016-04-20 MED ORDER — DOXYCYCLINE HYCLATE 100 MG PO TABS: 100.0000 mg | ORAL_TABLET | Freq: Two times a day (BID) | ORAL | Status: DC

## 2016-04-20 NOTE — ED Notes (Signed)
Patient has had difficulty breathing 6 days ago when exposed to hay dust. Patient has a history of COPD.

## 2016-04-20 NOTE — ED Provider Notes (Signed)
CSN: [id number]     Arrival date & time 04/20/16  1227 History   First MD Initiated Contact with Patient 04/20/16 1324     Chief Complaint  Patient presents with  . Respiratory Distress   (Consider location/radiation/quality/duration/timing/severity/associated sxs/prior Treatment) Patient is a 65 y.o. male presenting with URI.  URI Presenting symptoms: congestion and cough   Severity:  Moderate Onset quality:  Sudden Duration:  6 days Timing:  Constant Progression:  Worsening Chronicity:  Recurrent Relieved by:  Nebulizer treatments (some relief) Associated symptoms: wheezing   Associated symptoms: no arthralgias, no headaches, no myalgias, no neck pain and no sinus pain   Risk factors: chronic respiratory disease (COPD)   Risk factors: not elderly, no chronic cardiac disease, no chronic kidney disease, no diabetes mellitus, no immunosuppression, no recent illness, no recent travel and no sick contacts     Past Medical History  Diagnosis Date  . Hyperlipidemia   . COPD (chronic obstructive pulmonary disease) ([place])   . Depression   . GERD (gastroesophageal reflux disease)   . Colon polyps 08/2012    colonoscopy; multiple colon polyps; repeat colonoscopy in one year.  . Status post dilation of esophageal narrowing 2000  . Peptic ulcer   . Hypertension   . CARDIOMYOPATHY 03/24/2010    did not tolerate Toprol and Lisinopril.   . Insomnia   . Hemorrhoids   . MI (myocardial infarction) ([place])   . Atherosclerosis   . Anxiety   . Headache(784.0)   . Tinea pedis   . Myalgia   . Helicobacter pylori (H. pylori)   . Thyroid dysfunction   . Chest pain   . Back pain   . Hernia     inguinal  . Allergic rhinitis   . Bronchitis     09-14-11  . Chronic airway obstruction, not elsewhere classified   . Coronary artery disease 11/2009    Anterior MI with late presentation 11/2009. Cath showed a 100% proximal LAD occlusion, 99% mid RCA. Had a PCI and 2 DES to both LAD and RCA. EF was 35%.  Nuclear stress test 05/13: prior anterior/inferior infarcts without ischemic, EF 43%, cardiac cath in 02/14: Widely patent stents with no other obstructive disease. EF 40%   . Wears dentures     partial upper  . Diabetes mellitus without complication [facility])    Past Surgical History  Procedure Laterality Date  . Penile prosthesis implant    . Esophagogastroduodenoscopy  2008  . Colonoscopy    . Esophageal dilation    . Hernia repair  07/20/2012    L inguinal hernia repair  . Admission  12/20/2012    COPD exacerbation.  [place].  . Esophagogastroduodenoscopy  08/20/2012  . Cardiac catheterization    . Coronary angioplasty with stent placement  09/2009    LAD 3.0 X23 mm Xience DES, RCA: 4.0 X 15 mm Xience DES  . Cardiac catheterization  02/05/13    [facility]  . Cardiac catheterization  10/14    [facility] : patent stents with no change in anatomy. EF: 40$   Family History  Problem Relation Age of Onset  . Heart attack Brother     Brother #1  . Diabetes Brother     brother #2  . Hypertension Brother     #3  . Coronary artery disease Father 28    deceased  . Heart attack Father   . Diabetes Father   . Heart disease Father   . COPD Mother 65  deceased  . Heart attack Sister 52    acute-cocaine abuse/deceased  . Alcohol abuse Sister     polysubstance abuse  . COPD Sister   . Alcohol abuse Sister     polysubstance abuse  . Heart disease Brother     AMI x multiple   Social History  Substance Use Topics  . Smoking status: Current Every Day Smoker -- 1.50 packs/day for 42 years    Types: Cigarettes  . Smokeless tobacco: Never Used  . Alcohol Use: No    Review of Systems  HENT: Positive for congestion.   Respiratory: Positive for cough and wheezing.   Musculoskeletal: Negative for myalgias, arthralgias and neck pain.  Neurological: Negative for headaches.    Allergies  Wellbutrin  Home Medications   Prior to Admission medications   Medication Sig Start Date End Date Taking?  Authorizing Provider  ADVAIR DISKUS 500-50 MCG/DOSE AEPB USE 2 INHALATIONS DAILY 11/10/15  Yes [name], MD  albuterol (PROVENTIL) (2.5 MG/3ML) 0.083% nebulizer solution Take 3 mLs (2.5 mg total) by nebulization every 6 (six) hours as needed for wheezing or shortness of breath. 12/08/15  Yes [name], MD  albuterol (VENTOLIN HFA) 108 (90 BASE) MCG/ACT inhaler Inhale 2 puffs into the lungs every 6 (six) hours as needed. For wheezing 11/07/14  Yes [name], MD  aspirin 81 MG tablet Take 81 mg by mouth daily.     Yes Historical Provider, MD  atorvastatin (LIPITOR) 40 MG tablet Take 1 tablet (40 mg total) by mouth daily. 11/07/15  Yes Wellington Hampshire, MD  benzonatate (TESSALON) 200 MG capsule Take 1 capsule (200 mg total) by mouth 3 (three) times daily as needed for cough. 02/28/16  Yes Olen Cordial, NP  Cholecalciferol (VITAMIN D PO) Take by mouth daily.   Yes Historical Provider, MD  clopidogrel (PLAVIX) 75 MG tablet Take 1 tablet (75 mg total) by mouth daily. 02/04/16  Yes Minna Merritts, MD  fluticasone (FLONASE) 50 MCG/ACT nasal spray Place 1 spray into both nostrils 2 (two) times daily. 02/28/16  Yes Olen Cordial, NP  loratadine (CLARITIN) 10 MG tablet Take 1 tablet (10 mg total) by mouth daily. 02/28/16  Yes Olen Cordial, NP  LORazepam (ATIVAN) 0.5 MG tablet Take 1 tablet (0.5 mg total) by mouth daily as needed for anxiety. 09/28/15  Yes [name], MD  losartan (COZAAR) 25 MG tablet Take 1 tablet (25 mg total) by mouth daily. 02/04/16  Yes Minna Merritts, MD  metFORMIN (GLUCOPHAGE) 500 MG tablet Take 1 tablet (500 mg total) by mouth daily after supper. 10/26/15  Yes [name], MD  Multiple Vitamin (MULTIVITAMIN) capsule Take 1 capsule by mouth daily.   Yes Historical Provider, MD  pantoprazole (PROTONIX) 40 MG tablet Take 1 tablet (40 mg total) by mouth 2 (two) times daily. 03/16/16  Yes [name], MD  sertraline (ZOLOFT) 100 MG tablet Take 1-2 tablets  (100-200 mg total) by mouth daily. 06/13/15  Yes [name], MD  SPIRIVA HANDIHALER 18 MCG inhalation capsule PLACE 1 CAPSULE INTO INHALER AND INHALE CONTENTS OF 1 CAPSULE DAILY 11/10/15  Yes [name], MD  sucralfate (CARAFATE) 1 GM/10ML suspension TAKE 10ML( 2 TEASPOONSFUL) BY MOUTH FOUR TIMES DAILY 01/21/15  Yes [name], MD  CORTIFOAM 10 % rectal foam PLACE ONE APPLICATION RECTALLY TWICE DAILY 10/17/15   [name], MD  doxycycline (VIBRA-TABS) 100 MG tablet Take 1 tablet (100 mg  total) by mouth 2 (two) times daily. 04/20/16   Norval Gable, MD  nitroGLYCERIN (NITROSTAT) 0.4 MG SL tablet Place 1 tablet (0.4 mg total) under the tongue every 5 (five) minutes as needed. 03/13/15   Minna Merritts, MD  predniSONE (DELTASONE) 20 MG tablet 3 tabs po qd for 2 days, then 2 tabs po qd for 3 days, then 1 tab po qd for 3 days, then half a tab po qd for 2 days 04/20/16   Norval Gable, MD  sodium chloride (OCEAN) 0.65 % SOLN nasal spray Place 2 sprays into both nostrils every 2 (two) hours while awake. Patient not taking: Reported on 03/02/2016 02/28/16 03/20/16  Olen Cordial, NP   Meds Ordered and Administered this Visit  Medications - No data to display  BP 133/75 mmHg  Pulse 85  Temp(Src) 97 F (36.1 C) (Oral)  Resp 18  Ht 5\' 10"  (1.778 m)  Wt 160 lb (72.576 kg)  BMI 22.96 kg/m2  SpO2 100% No data found.   Physical Exam  Constitutional: He appears well-developed and well-nourished. No distress.  HENT:  Head: Normocephalic and atraumatic.  Right Ear: Tympanic membrane, external ear and ear canal normal.  Left Ear: Tympanic membrane, external ear and ear canal normal.  Nose: Nose normal.  Mouth/Throat: Uvula is midline, oropharynx is clear and moist and mucous membranes are normal. No oropharyngeal exudate or tonsillar abscesses.  Eyes: Conjunctivae and EOM are normal. Pupils are equal, round, and reactive to light. Right eye exhibits no discharge. Left eye exhibits no  discharge. No scleral icterus.  Neck: Normal range of motion. Neck supple. No tracheal deviation present. No thyromegaly present.  Cardiovascular: Normal rate, regular rhythm and normal heart sounds.   Pulmonary/Chest: Effort normal. No stridor. No respiratory distress. He has wheezes (mild diffuse rhonchi and wheezes). He has no rales. He exhibits no tenderness.  Lymphadenopathy:    He has no cervical adenopathy.  Neurological: He is alert.  Skin: Skin is warm and dry. No rash noted. He is not diaphoretic.  Nursing note and vitals reviewed.   ED Course  Procedures (including critical care time)  Labs Review Labs Reviewed - No data to display  Imaging Review No results found.   Visual Acuity Review  Right Eye Distance:   Left Eye Distance:   Bilateral Distance:    Right Eye Near:   Left Eye Near:    Bilateral Near:         MDM   1. COPD exacerbation [facility])     Discharge Medication List as of 04/20/2016  1:52 PM    START taking these medications   Details  doxycycline (VIBRA-TABS) 100 MG tablet Take 1 tablet (100 mg total) by mouth 2 (two) times daily., Starting 04/20/2016, Until Discontinued, Normal    predniSONE (DELTASONE) 20 MG tablet 3 tabs po qd for 2 days, then 2 tabs po qd for 3 days, then 1 tab po qd for 3 days, then half a tab po qd for 2 days, Normal       1. diagnosis reviewed with patient 2. rx as per orders above; reviewed possible side effects, interactions, risks and benefits  3. Recommend continue home nebulizer treatments and inhalers 4. Follow-up prn if symptoms worsen or don't improve   Norval Gable, MD 04/20/16 1624

## 2016-04-24 NOTE — Telephone Encounter (Signed)
Does he need Ventolin inhaler? Hasn't been prescribed since 2015?

## 2016-04-28 ENCOUNTER — Other Ambulatory Visit: Payer: Self-pay

## 2016-04-28 MED ORDER — LORAZEPAM 0.5 MG PO TABS: 0.5000 mg | ORAL_TABLET | Freq: Every day | ORAL | Status: DC | PRN

## 2016-04-28 NOTE — Telephone Encounter (Signed)
Please call in or fax refill of Lorazepam to pharmacy.

## 2016-04-28 NOTE — Telephone Encounter (Signed)
Pharm reqs RFs of lorazepam. Pended.

## 2016-04-29 NOTE — Telephone Encounter (Signed)
Called in.

## 2016-05-07 ENCOUNTER — Ambulatory Visit (INDEPENDENT_AMBULATORY_CARE_PROVIDER_SITE_OTHER): Payer: BLUE CROSS/BLUE SHIELD | Admitting: Cardiovascular Disease

## 2016-05-07 ENCOUNTER — Encounter: Payer: Self-pay | Admitting: Cardiovascular Disease

## 2016-05-07 ENCOUNTER — Encounter (INDEPENDENT_AMBULATORY_CARE_PROVIDER_SITE_OTHER): Payer: Self-pay

## 2016-05-07 VITALS — BP 120/68 | HR 63 | Ht 70.0 in | Wt 152.0 lb

## 2016-05-07 DIAGNOSIS — I5022 Chronic systolic (congestive) heart failure: Secondary | ICD-10-CM

## 2016-05-07 DIAGNOSIS — I10 Essential (primary) hypertension: Secondary | ICD-10-CM

## 2016-05-07 DIAGNOSIS — I2510 Atherosclerotic heart disease of native coronary artery without angina pectoris: Secondary | ICD-10-CM | POA: Diagnosis not present

## 2016-05-07 NOTE — Progress Notes (Signed)
Cardiology Office Note   Date:  05/07/2016   ID:  Leslie Sims, DOB 1950-12-23, MRN [id number]  PCP:  [name], MD  Cardiologist:   Kathlyn Sacramento, MD   Chief Complaint  Patient presents with  . other    6 month follow up. Meds reviewed by the ptient vebally. "doing well."       History of Present Illness: Leslie Sims is a 65 y.o. male who presents for a follow-up visit regarding coronary artery disease and mild ischemic cardiomyopathy. He had previous anterior myocardial infarction in December of 2010. His LAD was occluded and RCA had a 99% stenosis. He had an angioplasty and drug-eluting stent placement to both LAD and RCA at that time. 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 Cardiac catheterization in 02/14  showed widely patent stents in the LAD and RCA with no evidence of other obstructive disease. Ejection fraction was 40% with mildly elevated left ventricular end-diastolic pressure.  Carotid Doppler in November, 2015 showed only mild bilateral carotid stenosis which remained stable in 2017. He has been doing well and denies any chest pain. He has chronic exertional dyspnea and related to COPD. Atorvastatin was increased to 40 mg once daily during last visit.    Past Medical History  Diagnosis Date  . Hyperlipidemia   . COPD (chronic obstructive pulmonary disease) ([place])   . Depression   . GERD (gastroesophageal reflux disease)   . Colon polyps 08/2012    colonoscopy; multiple colon polyps; repeat colonoscopy in one year.  . Status post dilation of esophageal narrowing 2000  . Peptic ulcer   . Hypertension   . CARDIOMYOPATHY 03/24/2010    did not tolerate Toprol and Lisinopril.   . Insomnia   . Hemorrhoids   . MI (myocardial infarction) ([place])   . Atherosclerosis   . Anxiety   . Headache(784.0)   . Tinea pedis   . Myalgia   . Helicobacter pylori (H. pylori)   . Thyroid dysfunction   . Chest pain   . Back pain   . Hernia    inguinal  . Allergic rhinitis   . Bronchitis     09-14-11  . Chronic airway obstruction, not elsewhere classified   . Coronary artery disease 11/2009    Anterior MI with late presentation 11/2009. Cath showed a 100% proximal LAD occlusion, 99% mid RCA. Had a PCI and 2 DES to both LAD and RCA. EF was 35%. Nuclear stress test 05/13: prior anterior/inferior infarcts without ischemic, EF 43%, cardiac cath in 02/14: Widely patent stents with no other obstructive disease. EF 40%   . Wears dentures     partial upper  . Diabetes mellitus without complication [facility])     Past Surgical History  Procedure Laterality Date  . Penile prosthesis implant    . Esophagogastroduodenoscopy  2008  . Colonoscopy    . Esophageal dilation    . Hernia repair  07/20/2012    L inguinal hernia repair  . Admission  12/20/2012    COPD exacerbation.  [place].  . Esophagogastroduodenoscopy  08/20/2012  . Cardiac catheterization    . Coronary angioplasty with stent placement  09/2009    LAD 3.0 X23 mm Xience DES, RCA: 4.0 X 15 mm Xience DES  . Cardiac catheterization  02/05/13    [facility]  . Cardiac catheterization  10/14    [facility] : patent stents with no change in anatomy. EF: 40$     Current Outpatient Prescriptions  Medication Sig Dispense Refill  . ADVAIR DISKUS 500-50 MCG/DOSE AEPB USE 2 INHALATIONS DAILY 180 each 2  . albuterol (PROVENTIL) (2.5 MG/3ML) 0.083% nebulizer solution Take 3 mLs (2.5 mg total) by nebulization every 6 (six) hours as needed for wheezing or shortness of breath. 360 vial 4  . aspirin 81 MG tablet Take 81 mg by mouth daily.      [name] atorvastatin (LIPITOR) 40 MG tablet Take 1 tablet (40 mg total) by mouth daily. 90 tablet 3  . benzonatate (TESSALON) 200 MG capsule Take 1 capsule (200 mg total) by mouth 3 (three) times daily as needed for cough. 20 capsule 0  . Cholecalciferol (VITAMIN D PO) Take by mouth daily.    . CORTIFOAM 10 % rectal foam PLACE ONE APPLICATION RECTALLY TWICE DAILY 15 g 5  .  doxycycline (VIBRA-TABS) 100 MG tablet Take 1 tablet (100 mg total) by mouth 2 (two) times daily. 20 tablet 0  . fluticasone (FLONASE) 50 MCG/ACT nasal spray Place 1 spray into both nostrils 2 (two) times daily. 16 g 0  . loratadine (CLARITIN) 10 MG tablet Take 1 tablet (10 mg total) by mouth daily. 30 tablet 0  . LORazepam (ATIVAN) 0.5 MG tablet Take 1 tablet (0.5 mg total) by mouth daily as needed for anxiety. 30 tablet 3  . losartan (COZAAR) 25 MG tablet Take 1 tablet (25 mg total) by mouth daily. 90 tablet 3  . metFORMIN (GLUCOPHAGE) 500 MG tablet Take 1 tablet (500 mg total) by mouth daily after supper. 90 tablet 1  . Multiple Vitamin (MULTIVITAMIN) capsule Take 1 capsule by mouth daily.    . nitroGLYCERIN (NITROSTAT) 0.4 MG SL tablet Place 1 tablet (0.4 mg total) under the tongue every 5 (five) minutes as needed. 25 tablet 3  . pantoprazole (PROTONIX) 40 MG tablet Take 1 tablet (40 mg total) by mouth 2 (two) times daily. 180 tablet 3  . predniSONE (DELTASONE) 20 MG tablet 3 tabs po qd for 2 days, then 2 tabs po qd for 3 days, then 1 tab po qd for 3 days, then half a tab po qd for 2 days 16 tablet 0  . sertraline (ZOLOFT) 100 MG tablet Take 1-2 tablets (100-200 mg total) by mouth daily. 180 tablet 3  . SPIRIVA HANDIHALER 18 MCG inhalation capsule PLACE 1 CAPSULE INTO INHALER AND INHALE CONTENTS OF 1 CAPSULE DAILY 90 capsule 2  . sucralfate (CARAFATE) 1 GM/10ML suspension TAKE 10ML( 2 TEASPOONSFUL) BY MOUTH FOUR TIMES DAILY 3600 mL 1  . VENTOLIN HFA 108 (90 Base) MCG/ACT inhaler USE 2 INHALATIONS INTO  THE LUNGS  EVERY 6 HOURS AS NEEDED FOR WHEEZING 54 g 1  . sodium chloride (OCEAN) 0.65 % SOLN nasal spray Place 2 sprays into both nostrils every 2 (two) hours while awake. (Patient not taking: Reported on 03/02/2016)  0   No current facility-administered medications for this visit.    Allergies:   Wellbutrin    Social History:  The patient  reports that he has been smoking Cigarettes.  He has  a 63 pack-year smoking history. He has never used smokeless tobacco. He reports that he does not drink alcohol or use illicit drugs.   Family History:  The patient's family history includes Alcohol abuse in his sister and sister; COPD in his sister; COPD (age of onset: 52) in his mother; Coronary artery disease (age of onset: 88) in his father; Diabetes in his brother and father; Heart attack in his brother and father; Heart attack (  age of onset: 36) in his sister; Heart disease in his brother and father; Hypertension in his brother.    ROS:  Please see the history of present illness.   Otherwise, review of systems are positive for none.   All other systems are reviewed and negative.    PHYSICAL EXAM: VS:  BP 120/68 mmHg  Pulse 63  Ht 5\' 10"  (1.778 m)  Wt 152 lb (68.947 kg)  BMI 21.81 kg/m2 , BMI Body mass index is 21.81 kg/(m^2). GEN: Well nourished, well developed, in no acute distress HEENT: normal Neck: no JVD, carotid bruits, or masses Cardiac: RRR; no murmurs, rubs, or gallops,no edema  Respiratory:  clear to auscultation bilaterally, normal work of breathing GI: soft, nontender, nondistended, + BS MS: no deformity or atrophy Skin: warm and dry, no rash Neuro:  Strength and sensation are intact Psych: euthymic mood, full affect   EKG:  EKG is ordered today. The ekg ordered today demonstrates normal sinus rhythm with old anteroseptal infarct.   Recent Labs: 03/02/2016: ALT 13; BUN 15; Creat 0.86; Hemoglobin 14.3; Platelets 231; Potassium 4.1; Sodium 142    Lipid Panel    Component Value Date/Time   CHOL 140 03/02/2016 1314   TRIG 71 03/02/2016 1314   TRIG 65 01/15/2010   HDL 73 03/02/2016 1314   CHOLHDL 1.9 03/02/2016 1314   VLDL 14 03/02/2016 1314   LDLCALC 53 03/02/2016 1314      Wt Readings from Last 3 Encounters:  05/07/16 152 lb (68.947 kg)  04/20/16 160 lb (72.576 kg)  04/01/16 153 lb (69.4 kg)        ASSESSMENT AND PLAN:  1.  Coronary artery  disease involving native coronary arteries without angina: Drug-eluting stent placement was more than 7 years ago. Thus, I elected to discontinue Plavix. Continue lifelong low-dose aspirin.  2. Ischemic cardiomyopathy: Most recent ejection fraction was 40%. He did not tolerate treatment with a beta blocker and currently on low-dose losartan.  3. Hyperlipidemia: Lipid profile improved significantly after increasing the dose of atorvastatin. Most recent LDL was 53.  4. Tobacco use: Unfortunately, he is not able to quit smoking.    Disposition:   FU with me in 6 months  Signed,  Kathlyn Sacramento, MD  05/07/2016 12:38 PM    [place]

## 2016-05-07 NOTE — Patient Instructions (Signed)
Medication Instructions: Stop taking Plavix.  Continue other medications.   Labwork: None.   Procedures/Testing: None.   Follow-Up: 6 months with Dr. Tametria Aho.   Any Additional Special Instructions Will Be Listed Below (If Applicable).     If you need a refill on your cardiac medications before your next appointment, please call your pharmacy.   

## 2016-05-14 ENCOUNTER — Encounter: Payer: Self-pay | Admitting: *Deleted

## 2016-05-18 ENCOUNTER — Ambulatory Visit: Payer: BLUE CROSS/BLUE SHIELD | Admitting: Anesthesiology

## 2016-05-18 ENCOUNTER — Encounter: Payer: Self-pay | Admitting: Anesthesiology

## 2016-05-18 ENCOUNTER — Encounter
Admission: RE | Disposition: A | Discharge status: Home / Self Care | Payer: Self-pay | Source: Ambulatory Visit | Attending: Gastroenterology

## 2016-05-18 ENCOUNTER — Ambulatory Visit
Admission: RE | Admit: 2016-05-18 | Discharge: 2016-05-18 | Disposition: A | Discharge status: Home / Self Care | Payer: BLUE CROSS/BLUE SHIELD | Source: Ambulatory Visit | Attending: Gastroenterology | Admitting: Gastroenterology

## 2016-05-18 DIAGNOSIS — D122 Benign neoplasm of ascending colon: Secondary | ICD-10-CM | POA: Diagnosis not present

## 2016-05-18 DIAGNOSIS — I252 Old myocardial infarction: Secondary | ICD-10-CM | POA: Insufficient documentation

## 2016-05-18 DIAGNOSIS — Z8711 Personal history of peptic ulcer disease: Secondary | ICD-10-CM | POA: Diagnosis not present

## 2016-05-18 DIAGNOSIS — K5730 Diverticulosis of large intestine without perforation or abscess without bleeding: Secondary | ICD-10-CM | POA: Insufficient documentation

## 2016-05-18 DIAGNOSIS — Z7982 Long term (current) use of aspirin: Secondary | ICD-10-CM | POA: Diagnosis not present

## 2016-05-18 DIAGNOSIS — D125 Benign neoplasm of sigmoid colon: Secondary | ICD-10-CM | POA: Diagnosis not present

## 2016-05-18 DIAGNOSIS — D124 Benign neoplasm of descending colon: Secondary | ICD-10-CM | POA: Insufficient documentation

## 2016-05-18 DIAGNOSIS — Z1211 Encounter for screening for malignant neoplasm of colon: Secondary | ICD-10-CM | POA: Diagnosis present

## 2016-05-18 DIAGNOSIS — K641 Second degree hemorrhoids: Secondary | ICD-10-CM | POA: Insufficient documentation

## 2016-05-18 DIAGNOSIS — Z955 Presence of coronary angioplasty implant and graft: Secondary | ICD-10-CM | POA: Insufficient documentation

## 2016-05-18 DIAGNOSIS — F418 Other specified anxiety disorders: Secondary | ICD-10-CM | POA: Diagnosis not present

## 2016-05-18 DIAGNOSIS — Z860100 Personal history of colon polyps, unspecified: Secondary | ICD-10-CM | POA: Insufficient documentation

## 2016-05-18 DIAGNOSIS — E785 Hyperlipidemia, unspecified: Secondary | ICD-10-CM | POA: Diagnosis not present

## 2016-05-18 DIAGNOSIS — E119 Type 2 diabetes mellitus without complications: Secondary | ICD-10-CM | POA: Insufficient documentation

## 2016-05-18 DIAGNOSIS — D123 Benign neoplasm of transverse colon: Secondary | ICD-10-CM | POA: Insufficient documentation

## 2016-05-18 DIAGNOSIS — I10 Essential (primary) hypertension: Secondary | ICD-10-CM | POA: Insufficient documentation

## 2016-05-18 DIAGNOSIS — F17210 Nicotine dependence, cigarettes, uncomplicated: Secondary | ICD-10-CM | POA: Insufficient documentation

## 2016-05-18 DIAGNOSIS — Z79899 Other long term (current) drug therapy: Secondary | ICD-10-CM | POA: Insufficient documentation

## 2016-05-18 DIAGNOSIS — K219 Gastro-esophageal reflux disease without esophagitis: Secondary | ICD-10-CM | POA: Insufficient documentation

## 2016-05-18 DIAGNOSIS — I2510 Atherosclerotic heart disease of native coronary artery without angina pectoris: Secondary | ICD-10-CM | POA: Insufficient documentation

## 2016-05-18 DIAGNOSIS — J449 Chronic obstructive pulmonary disease, unspecified: Secondary | ICD-10-CM | POA: Insufficient documentation

## 2016-05-18 DIAGNOSIS — Z7984 Long term (current) use of oral hypoglycemic drugs: Secondary | ICD-10-CM | POA: Insufficient documentation

## 2016-05-18 HISTORY — PX: COLONOSCOPY WITH PROPOFOL: SHX5780

## 2016-05-18 LAB — GLUCOSE, CAPILLARY: GLUCOSE-CAPILLARY: 89 mg/dL (ref 65–99)

## 2016-05-18 SURGERY — COLONOSCOPY WITH PROPOFOL
Anesthesia: General

## 2016-05-18 MED ORDER — PROPOFOL 500 MG/50ML IV EMUL
INTRAVENOUS | Status: DC | PRN
  Administered 2016-05-18: 125 ug/kg/min via INTRAVENOUS

## 2016-05-18 MED ORDER — SODIUM CHLORIDE 0.9 % IV SOLN
INTRAVENOUS | Status: DC
  Administered 2016-05-18: 1000 mL via INTRAVENOUS

## 2016-05-18 MED ORDER — LIDOCAINE HCL (CARDIAC) 20 MG/ML IV SOLN
INTRAVENOUS | Status: DC | PRN
  Administered 2016-05-18: 40 mg via INTRAVENOUS

## 2016-05-18 MED ORDER — PROPOFOL 10 MG/ML IV BOLUS
INTRAVENOUS | Status: DC | PRN
  Administered 2016-05-18: 70 mg via INTRAVENOUS

## 2016-05-18 NOTE — Anesthesia Postprocedure Evaluation (Signed)
Anesthesia Post Note  Patient: Devian Tata Catalano  Procedure(s) Performed: Procedure(s) (LRB): COLONOSCOPY WITH PROPOFOL (N/A)  Patient location during evaluation: PACU Anesthesia Type: General Level of consciousness: oriented, awake and awake and alert Pain management: pain level controlled Vital Signs Assessment: post-procedure vital signs reviewed and stable Respiratory status: spontaneous breathing, respiratory function stable and nonlabored ventilation Cardiovascular status: stable Anesthetic complications: no    Last Vitals:  Filed Vitals:   05/18/16 1053 05/18/16 1103  BP: 130/83 135/73  Pulse: 61 55  Temp:    Resp: 16 13    Last Pain: There were no vitals filed for this visit.               [employer]

## 2016-05-18 NOTE — Op Note (Signed)
[facility] Patient Name: Leslie Sims Procedure Date: 05/18/2016 9:53 AM MRN: [id number] Account #: [id number] Date of Birth: 11-Feb-1951 Admit Type: Outpatient Age: 65 Room: [facility] ENDO ROOM 4 Gender: Male Note Status: Finalized Procedure:            Colonoscopy Indications:          High risk colon cancer surveillance: Personal history                        of colonic polyps Providers:            Lucilla Lame, MD Referring MD:         Renette Butters. Tamala Julian, MD (Referring MD) Medicines:            Propofol per Anesthesia Complications:        No immediate complications. Procedure:            Pre-Anesthesia Assessment:                       - Prior to the procedure, a History and Physical was                        performed, and patient medications and allergies were                        reviewed. The patient's tolerance of previous                        anesthesia was also reviewed. The risks and benefits of                        the procedure and the sedation options and risks were                        discussed with the patient. All questions were                        answered, and informed consent was obtained. Prior                        Anticoagulants: The patient has taken no previous                        anticoagulant or antiplatelet agents. ASA Grade                        Assessment: II - A patient with mild systemic disease.                        After reviewing the risks and benefits, the patient was                        deemed in satisfactory condition to undergo the                        procedure.                       After obtaining informed consent, the colonoscope was  passed under direct vision. Throughout the procedure,                        the patient's blood pressure, pulse, and oxygen                        saturations were monitored continuously. The                        Colonoscope  was introduced through the anus and                        advanced to the the cecum, identified by appendiceal                        orifice and ileocecal valve. The colonoscopy was                        performed without difficulty. The patient tolerated the                        procedure well. The quality of the bowel preparation                        was excellent. Findings:      The perianal and digital rectal examinations were normal.      Multiple sessile polyps were found in the ascending colon. The polyps       were 4 to 10 mm in size. These polyps were removed with a cold snare.       Resection and retrieval were complete.      Multiple sessile polyps were found in the sigmoid colon. The polyps were       7 to 9 mm in size. These polyps were removed with a cold snare.       Resection and retrieval were complete.      A 6 mm polyp was found in the sigmoid colon. The polyp was sessile. The       polyp was removed with a hot snare. Resection and retrieval were       complete.      A 4 mm polyp was found in the descending colon. The polyp was sessile.       The polyp was removed with a cold biopsy forceps. Resection and       retrieval were complete.      Non-bleeding internal hemorrhoids were found during retroflexion. The       hemorrhoids were Grade II (internal hemorrhoids that prolapse but reduce       spontaneously).      Multiple small-mouthed diverticula were found in the sigmoid colon.      Two sessile polyps were found in the transverse colon. The polyps were 4       to 7 mm in size. These polyps were removed with a cold snare. Resection       and retrieval were complete. Impression:           - Multiple 4 to 10 mm polyps in the ascending colon,                        removed with a cold snare. Resected and retrieved.                       -  Multiple 7 to 9 mm polyps in the sigmoid colon,                        removed with a cold snare. Resected and retrieved.                        - One 6 mm polyp in the sigmoid colon, removed with a                        hot snare. Resected and retrieved.                       - One 4 mm polyp in the descending colon, removed with                        a cold biopsy forceps. Resected and retrieved.                       - Non-bleeding internal hemorrhoids.                       - Diverticulosis in the sigmoid colon.                       - Two 4 to 7 mm polyps in the transverse colon, removed                        with a cold snare. Resected and retrieved. Recommendation:       - Await pathology results.                       - Repeat colonoscopy in 3 years if polyp adenoma and 10                        years if hyperplastic Procedure Code(s):    --- Professional ---                       [phone], Colonoscopy, flexible; with removal of tumor(s),                        polyp(s), or other lesion(s) by snare technique                       45380, 56, Colonoscopy, flexible; with biopsy, single                        or multiple Diagnosis Code(s):    --- Professional ---                       Z86.010, Personal history of colonic polyps                       D12.2, Benign neoplasm of ascending colon                       D12.4, Benign neoplasm of descending colon                       D12.5, Benign neoplasm of sigmoid colon CPT copyright 2016 American  Medical Association. All rights reserved. The codes documented in this report are preliminary and upon coder review may  be revised to meet current compliance requirements. Lucilla Lame, MD 05/18/2016 10:33:18 AM This report has been signed electronically. Number of Addenda: 0 Note Initiated On: 05/18/2016 9:53 AM Scope Withdrawal Time: 0 hours 19 minutes 40 seconds  Total Procedure Duration: 0 hours 28 minutes 58 seconds       [facility]

## 2016-05-18 NOTE — Anesthesia Postprocedure Evaluation (Signed)
Anesthesia Post Note  Patient: Leslie Sims  Procedure(s) Performed: Procedure(s) (LRB): COLONOSCOPY WITH PROPOFOL (N/A)  Patient location during evaluation: Endoscopy Anesthesia Type: General Level of consciousness: awake and alert Pain management: pain level controlled Vital Signs Assessment: post-procedure vital signs reviewed and stable Respiratory status: spontaneous breathing, nonlabored ventilation, respiratory function stable and patient connected to nasal cannula oxygen Cardiovascular status: blood pressure returned to baseline and stable Postop Assessment: no signs of nausea or vomiting Anesthetic complications: no    Last Vitals:  Filed Vitals:   05/18/16 1053 05/18/16 1103  BP: 130/83 135/73  Pulse: 61 55  Temp:    Resp: 16 13    Last Pain: There were no vitals filed for this visit.               Erial Fikes S

## 2016-05-18 NOTE — Transfer of Care (Signed)
Immediate Anesthesia Transfer of Care Note  Patient: Leslie Sims Study  Procedure(s) Performed: Procedure(s): COLONOSCOPY WITH PROPOFOL (N/A)  Patient Location: PACU  Anesthesia Type:General  Level of Consciousness: responds to stimulation  Airway & Oxygen Therapy: Patient Spontanous Breathing and Patient connected to nasal cannula oxygen  Post-op Assessment: Report given to RN and Post -op Vital signs reviewed and stable  Post vital signs: Reviewed and stable  Last Vitals:  Filed Vitals:   05/18/16 0900 05/18/16 1033  BP: 129/75 114/74  Pulse: 74 70  Temp: 36.1 C   Resp: 20 16    Last Pain: There were no vitals filed for this visit.       Complications: No apparent anesthesia complications

## 2016-05-18 NOTE — Anesthesia Preprocedure Evaluation (Signed)
Anesthesia Evaluation  Patient identified by MRN, date of birth, ID band Patient awake    Reviewed: Allergy & Precautions, NPO status , Patient's Chart, lab work & pertinent test results, reviewed documented beta blocker date and time   Airway Mallampati: II  TM Distance: >3 FB     Dental  (+) Chipped, Partial Upper   Pulmonary COPD, Current Smoker,           Cardiovascular hypertension, Pt. on medications + CAD, + Past MI and + Cardiac Stents       Neuro/Psych  Headaches, PSYCHIATRIC DISORDERS Anxiety Depression    GI/Hepatic PUD, GERD  ,  Endo/Other  diabetes, Type 2  Renal/GU      Musculoskeletal   Abdominal   Peds  Hematology   Anesthesia Other Findings Stents echo shows EF 40. Took bronchodil and steroids this am.  Reproductive/Obstetrics                             Anesthesia Physical Anesthesia Plan  ASA: III  Anesthesia Plan: General   Post-op Pain Management:    Induction: Intravenous  Airway Management Planned: Nasal Cannula  Additional Equipment:   Intra-op Plan:   Post-operative Plan:   Informed Consent: I have reviewed the patients History and Physical, chart, labs and discussed the procedure including the risks, benefits and alternatives for the proposed anesthesia with the patient or authorized representative who has indicated his/her understanding and acceptance.     Plan Discussed with: CRNA  Anesthesia Plan Comments:         Anesthesia Quick Evaluation

## 2016-05-18 NOTE — H&P (Signed)
[facility]  [address], [place],  [address] Phone: [phone] Fax : [phone]  Primary Care Physician:  [name], MD Primary Gastroenterologist:  Dr. Allen Norris  Pre-Procedure History & Physical: HPI:  Leslie Sims is a 65 y.o. male is here for an colonoscopy.   Past Medical History  Diagnosis Date  . Hyperlipidemia   . COPD (chronic obstructive pulmonary disease) ([place])   . Depression   . GERD (gastroesophageal reflux disease)   . Colon polyps 08/2012    colonoscopy; multiple colon polyps; repeat colonoscopy in one year.  . Status post dilation of esophageal narrowing 2000  . Peptic ulcer   . Hypertension   . CARDIOMYOPATHY 03/24/2010    did not tolerate Toprol and Lisinopril.   . Insomnia   . Hemorrhoids   . MI (myocardial infarction) ([place])   . Atherosclerosis   . Anxiety   . Headache(784.0)   . Tinea pedis   . Myalgia   . Helicobacter pylori (H. pylori)   . Thyroid dysfunction   . Chest pain   . Back pain   . Hernia     inguinal  . Allergic rhinitis   . Bronchitis     09-14-11  . Chronic airway obstruction, not elsewhere classified   . Coronary artery disease 11/2009    Anterior MI with late presentation 11/2009. Cath showed a 100% proximal LAD occlusion, 99% mid RCA. Had a PCI and 2 DES to both LAD and RCA. EF was 35%. Nuclear stress test 05/13: prior anterior/inferior infarcts without ischemic, EF 43%, cardiac cath in 02/14: Widely patent stents with no other obstructive disease. EF 40%   . Wears dentures     partial upper  . Diabetes mellitus without complication ([place])   . Shortness of breath dyspnea     Past Surgical History  Procedure Laterality Date  . Penile prosthesis implant    . Esophagogastroduodenoscopy  2008  . Colonoscopy    . Esophageal dilation    . Hernia repair  07/20/2012    L inguinal hernia repair  . Admission  12/20/2012    COPD exacerbation.  [place].  . Esophagogastroduodenoscopy  08/20/2012  .  Cardiac catheterization    . Coronary angioplasty with stent placement  09/2009    LAD 3.0 X23 mm Xience DES, RCA: 4.0 X 15 mm Xience DES  . Cardiac catheterization  02/05/13    [facility]  . Cardiac catheterization  10/14    [facility] : patent stents with no change in anatomy. EF: 40$    Prior to Admission medications   Medication Sig Start Date End Date Taking? Authorizing Provider  ADVAIR DISKUS 500-50 MCG/DOSE AEPB USE 2 INHALATIONS DAILY 11/10/15  Yes [name], MD  albuterol (PROVENTIL) (2.5 MG/3ML) 0.083% nebulizer solution Take 3 mLs (2.5 mg total) by nebulization every 6 (six) hours as needed for wheezing or shortness of breath. 12/08/15  Yes [name], MD  SPIRIVA HANDIHALER 18 MCG inhalation capsule PLACE 1 CAPSULE INTO INHALER AND INHALE CONTENTS OF 1 CAPSULE DAILY 11/10/15  Yes [name], MD  aspirin 81 MG tablet Take 81 mg by mouth daily.      Historical Provider, MD  atorvastatin (LIPITOR) 40 MG tablet Take 1 tablet (40 mg total) by mouth daily. 11/07/15   Wellington Hampshire, MD  benzonatate (TESSALON) 200 MG capsule Take 1 capsule (200 mg total) by mouth 3 (three) times daily as needed for cough. 02/28/16   Olen Cordial, NP  Cholecalciferol (VITAMIN D PO) Take by mouth daily.    Historical Provider, MD  CORTIFOAM 10 % rectal foam PLACE ONE APPLICATION RECTALLY TWICE DAILY 10/17/15   [name], MD  doxycycline (VIBRA-TABS) 100 MG tablet Take 1 tablet (100 mg total) by mouth 2 (two) times daily. 04/20/16   Norval Gable, MD  fluticasone (FLONASE) 50 MCG/ACT nasal spray Place 1 spray into both nostrils 2 (two) times daily. 02/28/16   Olen Cordial, NP  loratadine (CLARITIN) 10 MG tablet Take 1 tablet (10 mg total) by mouth daily. 02/28/16   Olen Cordial, NP  LORazepam (ATIVAN) 0.5 MG tablet Take 1 tablet (0.5 mg total) by mouth daily as needed for anxiety. 04/28/16   [name], MD  losartan (COZAAR) 25 MG tablet Take 1 tablet (25 mg total) by mouth daily.  02/04/16   Minna Merritts, MD  metFORMIN (GLUCOPHAGE) 500 MG tablet Take 1 tablet (500 mg total) by mouth daily after supper. 10/26/15   [name], MD  Multiple Vitamin (MULTIVITAMIN) capsule Take 1 capsule by mouth daily.    Historical Provider, MD  nitroGLYCERIN (NITROSTAT) 0.4 MG SL tablet Place 1 tablet (0.4 mg total) under the tongue every 5 (five) minutes as needed. 03/13/15   Minna Merritts, MD  pantoprazole (PROTONIX) 40 MG tablet Take 1 tablet (40 mg total) by mouth 2 (two) times daily. 03/16/16   [name], MD  predniSONE (DELTASONE) 20 MG tablet 3 tabs po qd for 2 days, then 2 tabs po qd for 3 days, then 1 tab po qd for 3 days, then half a tab po qd for 2 days 04/20/16   Norval Gable, MD  sertraline (ZOLOFT) 100 MG tablet Take 1-2 tablets (100-200 mg total) by mouth daily. 06/13/15   [name], MD  sodium chloride (OCEAN) 0.65 % SOLN nasal spray Place 2 sprays into both nostrils every 2 (two) hours while awake. Patient not taking: Reported on 03/02/2016 02/28/16 03/20/16  Olen Cordial, NP  sucralfate (CARAFATE) 1 GM/10ML suspension TAKE 10ML( 2 TEASPOONSFUL) BY MOUTH FOUR TIMES DAILY 01/21/15   [name], MD  VENTOLIN HFA 108 [phone] Base) MCG/ACT inhaler USE 2 INHALATIONS INTO  THE LUNGS  EVERY 6 HOURS AS NEEDED FOR WHEEZING 04/26/16   [name], MD    Allergies as of 04/05/2016 - Review Complete 04/01/2016  Allergen Reaction Noted  . Wellbutrin [bupropion] Other (See Comments) 07/21/2012    Family History  Problem Relation Age of Onset  . Heart attack Brother     Brother #1  . Diabetes Brother     brother #2  . Hypertension Brother     #3  . Coronary artery disease Father 66    deceased  . Heart attack Father   . Diabetes Father   . Heart disease Father   . COPD Mother 77    deceased  . Heart attack Sister 73    acute-cocaine abuse/deceased  . Alcohol abuse Sister     polysubstance abuse  . COPD Sister   . Alcohol abuse Sister     polysubstance  abuse  . Heart disease Brother     AMI x multiple    Social History   Social History  . Marital Status: Married    Spouse Name: N/A  . Number of Children: 5  . 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  Smoking status: Current Every Day Smoker -- 1.50 packs/day for 42 years    Types: Cigarettes  . Smokeless tobacco: Never Used  . Alcohol Use: No  . Drug Use: No  . Sexual Activity: Yes   Other Topics Concern  . Not on file   Social History Narrative   Marital status: married x 32 years; happily married.      Children: 4 children;  1 stepchild and 15 grandchildren; no great grandchildren.      Lives: with wife; 2 dogs, 1 cat.      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      Tobacco: 1 ppd x 45 years      Alcohol: none; previous alcoholism      Drugs: none      Exercise: no formal exercise; physically demanding job; owns horses.      Seatbelts:  100%      Guns: one loaded unsecured gun in the home.      Sunscreen: none    Review of Systems: See HPI, otherwise negative ROS  Physical Exam: BP 129/75 mmHg  Pulse 74  Temp(Src) 97 F (36.1 C) (Tympanic)  Resp 20  SpO2 96% General:   Alert,  pleasant and cooperative in NAD Head:  Normocephalic and atraumatic. Neck:  Supple; no masses or thyromegaly. Lungs:  Clear throughout to auscultation.    Heart:  Regular rate and rhythm. Abdomen:  Soft, nontender and nondistended. Normal bowel sounds, without guarding, and without rebound.   Neurologic:  Alert and  oriented x4;  grossly normal neurologically.  Impression/Plan: Leslie Sims is here for an colonoscopy to be performed for history of colon polyps  Risks, benefits, limitations, and alternatives regarding  colonoscopy have been reviewed with the patient.  Questions have been answered.  All parties agreeable.   Lucilla Lame, MD  05/18/2016, 9:20 AM

## 2016-05-19 ENCOUNTER — Encounter: Payer: Self-pay | Admitting: Gastroenterology

## 2016-05-20 LAB — SURGICAL PATHOLOGY

## 2016-05-21 ENCOUNTER — Ambulatory Visit
Admission: EM | Admit: 2016-05-21 | Discharge: 2016-05-21 | Disposition: A | Discharge status: Home / Self Care | Payer: BLUE CROSS/BLUE SHIELD | Attending: Family Medicine | Admitting: Family Medicine

## 2016-05-21 DIAGNOSIS — J441 Chronic obstructive pulmonary disease with (acute) exacerbation: Secondary | ICD-10-CM

## 2016-05-21 MED ORDER — LEVOFLOXACIN 500 MG PO TABS: 500.0000 mg | ORAL_TABLET | Freq: Every day | ORAL | Status: DC

## 2016-05-21 MED ORDER — PREDNISONE 10 MG (21) PO TBPK: ORAL_TABLET | ORAL | Status: DC

## 2016-05-21 MED ORDER — METHYLPREDNISOLONE SODIUM SUCC 125 MG IJ SOLR
125.0000 mg | Freq: Once | INTRAMUSCULAR | Status: AC
  Administered 2016-05-21: 125 mg via INTRAMUSCULAR

## 2016-05-21 MED ORDER — BENZONATATE 100 MG PO CAPS: 200.0000 mg | ORAL_CAPSULE | Freq: Three times a day (TID) | ORAL | Status: DC | PRN

## 2016-05-21 MED ORDER — LORATADINE-PSEUDOEPHEDRINE ER 10-240 MG PO TB24: 1.0000 | ORAL_TABLET | Freq: Every day | ORAL | Status: DC

## 2016-05-21 NOTE — ED Provider Notes (Signed)
CSN: [id number]     Arrival date & time 05/21/16  1223 History   First MD Initiated Contact with Patient 05/21/16 1341    Nurses notes were reviewed. Chief Complaint  Patient presents with  . Breathing Problem    Pt with COPD and often has breathing issues. Seen last week by Allergist for similiar issues and had PFTs performed. Reports he gets recurrent URIs. Reports latest occurrance has lasted approx 10 days and coughing up greenish phlegm. Pt continues to smoke   Patient with a history of COPD and breathing troubles. Since the last 10 days she's been coughing up greenish phlegm is asked exacerbation of his acute COPD was about a month ago. Unfortunately he still smokes. He has had a recent colonoscopy. He has previous past smoker history of hypertension cardiomyopathy insomnia hemorrhoids myocardial infarction as well. He also has diabetes. Family medical history multiple family members with coronary artery disease COPD and diabetes  He had a colonoscopy earlier this week pulse ox about 95. He states that he's recently had a breathing treatment at home and doesn't like he needs another breathing treatment now.  (Consider location/radiation/quality/duration/timing/severity/associated sxs/prior Treatment) Patient is a 65 y.o. male presenting with difficulty breathing. The history is provided by the patient. No language interpreter was used.  Breathing Problem This is a recurrent problem. The current episode started more than 1 week ago. The problem occurs constantly. The problem has been gradually worsening. Associated symptoms include shortness of breath. Pertinent negatives include no chest pain, no abdominal pain and no headaches. The symptoms are aggravated by coughing and exertion. Nothing relieves the symptoms. Treatments tried: Procedures multiple his inhalers.    Past Medical History  Diagnosis Date  . Hyperlipidemia   . COPD (chronic obstructive pulmonary disease) ([place])   . Depression    . GERD (gastroesophageal reflux disease)   . Colon polyps 08/2012    colonoscopy; multiple colon polyps; repeat colonoscopy in one year.  . Status post dilation of esophageal narrowing 2000  . Peptic ulcer   . Hypertension   . CARDIOMYOPATHY 03/24/2010    did not tolerate Toprol and Lisinopril.   . Insomnia   . Hemorrhoids   . MI (myocardial infarction) ([place])   . Atherosclerosis   . Anxiety   . Headache(784.0)   . Tinea pedis   . Myalgia   . Helicobacter pylori (H. pylori)   . Thyroid dysfunction   . Chest pain   . Back pain   . Hernia     inguinal  . Allergic rhinitis   . Bronchitis     09-14-11  . Chronic airway obstruction, not elsewhere classified   . Coronary artery disease 11/2009    Anterior MI with late presentation 11/2009. Cath showed a 100% proximal LAD occlusion, 99% mid RCA. Had a PCI and 2 DES to both LAD and RCA. EF was 35%. Nuclear stress test 05/13: prior anterior/inferior infarcts without ischemic, EF 43%, cardiac cath in 02/14: Widely patent stents with no other obstructive disease. EF 40%   . Wears dentures     partial upper  . Diabetes mellitus without complication ([place])   . Shortness of breath dyspnea    Past Surgical History  Procedure Laterality Date  . Penile prosthesis implant    . Esophagogastroduodenoscopy  2008  . Colonoscopy    . Esophageal dilation    . Hernia repair  07/20/2012    L inguinal hernia repair  . Admission  12/20/2012    COPD exacerbation.  [facility].  . Esophagogastroduodenoscopy  08/20/2012  . Cardiac catheterization    . Coronary angioplasty with stent placement  09/2009    LAD 3.0 X23 mm Xience DES, RCA: 4.0 X 15 mm Xience DES  . Cardiac catheterization  02/05/13    [facility]  . Cardiac catheterization  10/14    [facility] : patent stents with no change in anatomy. EF: 40$  . Colonoscopy with propofol N/A 05/18/2016    Procedure: COLONOSCOPY WITH PROPOFOL;  Surgeon: Lucilla Lame, MD;  Location: [facility] ENDOSCOPY;  Service: Endoscopy;  Laterality:  N/A;   Family History  Problem Relation Age of Onset  . Heart attack Brother     Brother #1  . Diabetes Brother     brother #2  . Hypertension Brother     #3  . Coronary artery disease Father 37    deceased  . Heart attack Father   . Diabetes Father   . Heart disease Father   . COPD Mother 73    deceased  . Heart attack Sister 63    acute-cocaine abuse/deceased  . Alcohol abuse Sister     polysubstance abuse  . COPD Sister   . Alcohol abuse Sister     polysubstance abuse  . Heart disease Brother     AMI x multiple   Social History  Substance Use Topics  . Smoking status: Current Every Day Smoker -- 2.00 packs/day for 42 years    Types: Cigarettes  . Smokeless tobacco: Never Used  . Alcohol Use: No    Review of Systems  Respiratory: Positive for shortness of breath.   Cardiovascular: Negative for chest pain.  Gastrointestinal: Negative for abdominal pain.  Neurological: Negative for headaches.  All other systems reviewed and are negative.   Allergies  Wellbutrin  Home Medications   Prior to Admission medications   Medication Sig Start Date End Date Taking? Authorizing Provider  ADVAIR DISKUS 500-50 MCG/DOSE AEPB USE 2 INHALATIONS DAILY 11/10/15  Yes [name], MD  albuterol (PROVENTIL) (2.5 MG/3ML) 0.083% nebulizer solution Take 3 mLs (2.5 mg total) by nebulization every 6 (six) hours as needed for wheezing or shortness of breath. 12/08/15  Yes [name], MD  aspirin 81 MG tablet Take 81 mg by mouth daily.     Yes Historical Provider, MD  atorvastatin (LIPITOR) 40 MG tablet Take 1 tablet (40 mg total) by mouth daily. 11/07/15  Yes Wellington Hampshire, MD  Cholecalciferol (VITAMIN D PO) Take by mouth daily.   Yes Historical Provider, MD  CORTIFOAM 10 % rectal foam PLACE ONE APPLICATION RECTALLY TWICE DAILY 10/17/15  Yes [name], MD  fluticasone [facility]) 50 MCG/ACT nasal spray Place 1 spray into both nostrils 2 (two) times daily. 02/28/16  Yes Olen Cordial, NP  loratadine (CLARITIN) 10 MG tablet Take 1 tablet (10 mg total) by mouth daily. 02/28/16  Yes Olen Cordial, NP  LORazepam (ATIVAN) 0.5 MG tablet Take 1 tablet (0.5 mg total) by mouth daily as needed for anxiety. 04/28/16  Yes [name], MD  losartan (COZAAR) 25 MG tablet Take 1 tablet (25 mg total) by mouth daily. 02/04/16  Yes Minna Merritts, MD  metFORMIN (GLUCOPHAGE) 500 MG tablet Take 1 tablet (500 mg total) by mouth daily after supper. 10/26/15  Yes [name], MD  Multiple Vitamin (MULTIVITAMIN) capsule Take 1 capsule by mouth daily.   Yes Historical Provider, MD  nitroGLYCERIN (NITROSTAT) 0.4 MG SL tablet Place 1 tablet (0.4  mg total) under the tongue every 5 (five) minutes as needed. 03/13/15  Yes Minna Merritts, MD  pantoprazole (PROTONIX) 40 MG tablet Take 1 tablet (40 mg total) by mouth 2 (two) times daily. 03/16/16  Yes [name], MD  sertraline (ZOLOFT) 100 MG tablet Take 1-2 tablets (100-200 mg total) by mouth daily. 06/13/15  Yes [name], MD  sodium chloride (OCEAN) 0.65 % SOLN nasal spray Place 2 sprays into both nostrils every 2 (two) hours while awake. 02/28/16 05/21/16 Yes Olen Cordial, NP  SPIRIVA HANDIHALER 18 MCG inhalation capsule PLACE 1 CAPSULE INTO INHALER AND INHALE CONTENTS OF 1 CAPSULE DAILY 11/10/15  Yes [name], MD  sucralfate (CARAFATE) 1 GM/10ML suspension TAKE 10ML( 2 TEASPOONSFUL) BY MOUTH FOUR TIMES DAILY 01/21/15  Yes [name], MD  VENTOLIN HFA 108 [phone] Base) MCG/ACT inhaler USE 2 INHALATIONS INTO  THE LUNGS  EVERY 6 HOURS AS NEEDED FOR WHEEZING 04/26/16  Yes [name], MD  benzonatate (TESSALON PERLES) 100 MG capsule Take 2 capsules (200 mg total) by mouth 3 (three) times daily as needed for cough. 05/21/16   Frederich Cha, MD  benzonatate (TESSALON) 200 MG capsule Take 1 capsule (200 mg total) by mouth 3 (three) times daily as needed for cough. 02/28/16   Olen Cordial, NP  doxycycline (VIBRA-TABS) 100 MG tablet  Take 1 tablet (100 mg total) by mouth 2 (two) times daily. 04/20/16   Norval Gable, MD  levofloxacin (LEVAQUIN) 500 MG tablet Take 1 tablet (500 mg total) by mouth daily. 05/21/16   Frederich Cha, MD  loratadine-pseudoephedrine (CLARITIN-D 24 HOUR) 10-240 MG 24 hr tablet Take 1 tablet by mouth daily. 05/21/16   Frederich Cha, MD  predniSONE (DELTASONE) 20 MG tablet 3 tabs po qd for 2 days, then 2 tabs po qd for 3 days, then 1 tab po qd for 3 days, then half a tab po qd for 2 days 04/20/16   Norval Gable, MD  predniSONE (STERAPRED UNI-PAK 21 TAB) 10 MG (21) TBPK tablet Sig 6 tablet day 1, 5 tablets day 2, 4 tablets day 3,,3tablets day 4, 2 tablets day 5, 1 tablet day 6 take all tablets orally 05/21/16   Frederich Cha, MD   Meds Ordered and Administered this Visit   Medications  methylPREDNISolone sodium succinate (SOLU-MEDROL) 125 mg/2 mL injection 125 mg (125 mg Intramuscular Given 05/21/16 1407)    BP 122/63 mmHg  Pulse 57  Temp(Src) 98 F (36.7 C) (Oral)  Resp 20  Ht 5\' 10"  (1.778 m)  Wt 152 lb (68.947 kg)  BMI 21.81 kg/m2  SpO2 94% No data found.   Physical Exam  Constitutional: He is oriented to person, place, and time. He appears cachectic.  Ill-appearing white male  HENT:  Head: Normocephalic and atraumatic.  Eyes: Conjunctivae are normal. Pupils are equal, round, and reactive to light.  Neck: Neck supple.  Cardiovascular: Exam reveals distant heart sounds.   Pulmonary/Chest: He has decreased breath sounds. He has wheezes.  Musculoskeletal: Normal range of motion. He exhibits no tenderness.  Neurological: He is alert and oriented to person, place, and time.  Skin: Skin is warm and dry.  Vitals reviewed.   ED Course  Procedures (including critical care time)  Labs Review Labs Reviewed - No data to display  Imaging Review No results found.   Visual Acuity Review  Right Eye Distance:   Left Eye Distance:   Bilateral Distance:    Right Eye Near:  Left Eye Near:     Bilateral Near:         MDM   1. COPD with acute exacerbation ([place])     Patient declined of breathing treatment here pulse ox is 95%. Will give him Solu-Medrol 125 mg IM. He requested a switch from Claritin to Claritin-D. We'll place him on Tessalon Perles 200 mg 1 tablet 3 times a day as needed and also will place him back on Levaquin 500 mg 1 tablet day for 10 days since that seemed to work most effectively for him. Follow-up with PCP in about 2 weeks. Also placed on prednisone for the next 6 days as well. On a decreasing dose pack.  Note: This dictation was prepared with Dragon dictation along with smaller phrase technology. Any transcriptional errors that result from this process are unintentional.    Frederich Cha, MD 05/21/16 1535

## 2016-05-21 NOTE — Discharge Instructions (Signed)

## 2016-05-24 ENCOUNTER — Encounter: Payer: Self-pay | Admitting: Gastroenterology

## 2016-06-01 ENCOUNTER — Encounter: Payer: Self-pay | Admitting: Emergency Medicine

## 2016-06-01 ENCOUNTER — Ambulatory Visit
Admission: EM | Admit: 2016-06-01 | Discharge: 2016-06-01 | Disposition: A | Discharge status: Home / Self Care | Payer: BLUE CROSS/BLUE SHIELD | Attending: Internal Medicine | Admitting: Internal Medicine

## 2016-06-01 DIAGNOSIS — R05 Cough: Secondary | ICD-10-CM | POA: Diagnosis not present

## 2016-06-01 DIAGNOSIS — R0902 Hypoxemia: Secondary | ICD-10-CM | POA: Diagnosis not present

## 2016-06-01 MED ORDER — DOXYCYCLINE HYCLATE 100 MG PO CAPS: 100.0000 mg | ORAL_CAPSULE | Freq: Two times a day (BID) | ORAL | Status: DC

## 2016-06-01 NOTE — ED Provider Notes (Signed)
CSN: [id number]     Arrival date & time 06/01/16  1354 History   First MD Initiated Contact with Patient 06/01/16 1501     Chief Complaint  Patient presents with  . Fatigue   HPI  Patient is a 65 year old with past medical history notable for COPD. He was working next to his mobile home 3 days ago, and accidentally cut a sewer line. He was overwhelmed by gases, and went into the house for breathing treatment. Respiratory symptoms subsided quickly, but he has felt kind of out of it, tired and draggy, since. Cough is at baseline, mostly occurs at night. No change in phlegm production. No fever. Had some headache on Saturday, which is improving. He feels achy, but this is not really new. No nausea/vomiting. One day recently, had a little bit of loose stool, had 3-4 episodes that day. Has been sleeping in the recliner for the last 3 or 4 months. Wife has some health issues, and he needs to be close by in case she needs him. Recent antibiotic use, had doxycycline and prednisone May 2. Had Levaquin and prednisone June 2.  Past Medical History  Diagnosis Date  . Hyperlipidemia   . COPD (chronic obstructive pulmonary disease) ([place])   . Depression   . GERD (gastroesophageal reflux disease)   . Colon polyps 08/2012    colonoscopy; multiple colon polyps; repeat colonoscopy in one year.  . Status post dilation of esophageal narrowing 2000  . Peptic ulcer   . Hypertension   . CARDIOMYOPATHY 03/24/2010    did not tolerate Toprol and Lisinopril.   . Insomnia   . Hemorrhoids   . MI (myocardial infarction) ([place])   . Atherosclerosis   . Anxiety   . Headache(784.0)   . Tinea pedis   . Myalgia   . Helicobacter pylori (H. pylori)   . Thyroid dysfunction   . Chest pain   . Back pain   . Hernia     inguinal  . Allergic rhinitis   . Bronchitis     09-14-11  . Chronic airway obstruction, not elsewhere classified   . Coronary artery disease 11/2009    Anterior MI with late presentation 11/2009. Cath  showed a 100% proximal LAD occlusion, 99% mid RCA. Had a PCI and 2 DES to both LAD and RCA. EF was 35%. Nuclear stress test 05/13: prior anterior/inferior infarcts without ischemic, EF 43%, cardiac cath in 02/14: Widely patent stents with no other obstructive disease. EF 40%   . Wears dentures     partial upper  . Diabetes mellitus without complication ([place])   . Shortness of breath dyspnea    Past Surgical History  Procedure Laterality Date  . Penile prosthesis implant    . Esophagogastroduodenoscopy  2008  . Colonoscopy    . Esophageal dilation    . Hernia repair  07/20/2012    L inguinal hernia repair  . Admission  12/20/2012    COPD exacerbation.  [place].  . Esophagogastroduodenoscopy  08/20/2012  . Cardiac catheterization    . Coronary angioplasty with stent placement  09/2009    LAD 3.0 X23 mm Xience DES, RCA: 4.0 X 15 mm Xience DES  . Cardiac catheterization  02/05/13    [facility]  . Cardiac catheterization  10/14    [facility] : patent stents with no change in anatomy. EF: 40$  . Colonoscopy with propofol N/A 05/18/2016    Procedure: COLONOSCOPY WITH PROPOFOL;  Surgeon: Lucilla Lame, MD;  Location: [facility] ENDOSCOPY;  Service:  Endoscopy;  Laterality: N/A;   Family History  Problem Relation Age of Onset  . Heart attack Brother     Brother #1  . Diabetes Brother     brother #2  . Hypertension Brother     #3  . Coronary artery disease Father 24    deceased  . Heart attack Father   . Diabetes Father   . Heart disease Father   . COPD Mother 60    deceased  . Heart attack Sister 110    acute-cocaine abuse/deceased  . Alcohol abuse Sister     polysubstance abuse  . COPD Sister   . Alcohol abuse Sister     polysubstance abuse  . Heart disease Brother     AMI x multiple   Social History  Substance Use Topics  . Smoking status: Current Every Day Smoker -- 2.00 packs/day for 42 years    Types: Cigarettes  . Smokeless tobacco: Never Used  . Alcohol Use: No    Review of Systems  All  other systems reviewed and are negative.   Allergies  Wellbutrin  Home Medications   Prior to Admission medications   Medication Sig Start Date End Date Taking? Authorizing Provider  ADVAIR DISKUS 500-50 MCG/DOSE AEPB USE 2 INHALATIONS DAILY 11/10/15  Yes [name], MD  albuterol (PROVENTIL) (2.5 MG/3ML) 0.083% nebulizer solution Take 3 mLs (2.5 mg total) by nebulization every 6 (six) hours as needed for wheezing or shortness of breath. 12/08/15  Yes [name], MD  aspirin 81 MG tablet Take 81 mg by mouth daily.     Yes Historical Provider, MD  atorvastatin (LIPITOR) 40 MG tablet Take 1 tablet (40 mg total) by mouth daily. 11/07/15  Yes Wellington Hampshire, MD  benzonatate (TESSALON PERLES) 100 MG capsule Take 2 capsules (200 mg total) by mouth 3 (three) times daily as needed for cough. 05/21/16  Yes Frederich Cha, MD  loratadine-pseudoephedrine (CLARITIN-D 24 HOUR) 10-240 MG 24 hr tablet Take 1 tablet by mouth daily. 05/21/16  Yes Frederich Cha, MD  losartan (COZAAR) 25 MG tablet Take 1 tablet (25 mg total) by mouth daily. 02/04/16  Yes Minna Merritts, MD  metFORMIN (GLUCOPHAGE) 500 MG tablet Take 1 tablet (500 mg total) by mouth daily after supper. 10/26/15  Yes [name], MD  Multiple Vitamin (MULTIVITAMIN) capsule Take 1 capsule by mouth daily.   Yes Historical Provider, MD  sertraline (ZOLOFT) 100 MG tablet Take 1-2 tablets (100-200 mg total) by mouth daily. 06/13/15  Yes [name], MD  Cholecalciferol (VITAMIN D PO) Take by mouth daily.    Historical Provider, MD  CORTIFOAM 10 % rectal foam PLACE ONE APPLICATION RECTALLY TWICE DAILY 10/17/15   [name], MD  LORazepam (ATIVAN) 0.5 MG tablet Take 1 tablet (0.5 mg total) by mouth daily as needed for anxiety. 04/28/16   [name], MD  nitroGLYCERIN (NITROSTAT) 0.4 MG SL tablet Place 1 tablet (0.4 mg total) under the tongue every 5 (five) minutes as needed. 03/13/15   Minna Merritts, MD  pantoprazole (PROTONIX) 40 MG  tablet Take 1 tablet (40 mg total) by mouth 2 (two) times daily. 03/16/16   [name], MD  SPIRIVA HANDIHALER 18 MCG inhalation capsule PLACE 1 CAPSULE INTO INHALER AND INHALE CONTENTS OF 1 CAPSULE DAILY 11/10/15   [name], MD  sucralfate (CARAFATE) 1 GM/10ML suspension TAKE 10ML( 2 TEASPOONSFUL) BY MOUTH FOUR TIMES DAILY 01/21/15   [name],  MD  VENTOLIN HFA 108 (90 Base) MCG/ACT inhaler USE 2 INHALATIONS INTO  THE LUNGS  EVERY 6 HOURS AS NEEDED FOR WHEEZING 04/26/16   [name], MD      BP 102/56 mmHg  Pulse 79  Temp(Src) 97.7 F (36.5 C) (Tympanic)  Resp 18  Ht 5\' 10"  (1.778 m)  Wt 151 lb (68.493 kg)  BMI 21.67 kg/m2  SpO2 97% Physical Exam  Constitutional: He is oriented to person, place, and time. No distress.  Alert, nicely groomed Splinting slightly, appears slightly dyspneic with speech, but not acutely so  HENT:  Head: Atraumatic.  Bilateral TMs are moderately dull, red tinged Moderate nasal congestion bilaterally Throat is red with some postnasal drainage, right tonsil is slightly enlarged with scant exudate versus tonsil stones. Throat is not sore  Eyes:  Conjugate gaze, no eye redness/drainage  Neck: Neck supple.  Cardiovascular: Normal rate and regular rhythm.   Pulmonary/Chest: No respiratory distress. He has no rales.  Diffuse expiratory wheezes, but symmetric breath sounds Somewhat diminished posteriorly  Abdominal: He exhibits no distension.  Musculoskeletal: Normal range of motion. He exhibits no edema.  Neurological: He is alert and oriented to person, place, and time.  Skin: Skin is warm and dry. No rash noted. No pallor.  Tanned. No cyanosis  Nursing note and vitals reviewed.   ED Course  Procedures (including critical care time)  None   MDM   1. Hypoxic episode    Anticipate gradual improvement in energy and well-being following fume exposure a few days ago. Prescription for doxycycline given, to fill if increasing  cough/increasing phlegm production/new fever >100.5 occur. Followup with Dr Tamala Julian to discuss further evaluation if symptoms not continuing to improve over the next several days.    Sherlene Shams, MD 06/09/16 [phone]

## 2016-06-01 NOTE — Discharge Instructions (Signed)
Anticipate gradual improvement in energy and well-being following fume exposure a few days ago. Prescription for doxycycline given, to fill if increasing cough/increasing phlegm production/new fever >100.5 occur. Followup with Dr Tamala Julian to discuss further evaluation if symptoms not continuing to improve over the next several days.

## 2016-06-01 NOTE — ED Notes (Signed)
Patient states he broke a sewer line under his mobile home and immediately developed chills and fatigue

## 2016-06-15 ENCOUNTER — Ambulatory Visit: Payer: BLUE CROSS/BLUE SHIELD

## 2016-06-15 ENCOUNTER — Ambulatory Visit (INDEPENDENT_AMBULATORY_CARE_PROVIDER_SITE_OTHER): Payer: BLUE CROSS/BLUE SHIELD

## 2016-06-15 ENCOUNTER — Ambulatory Visit (INDEPENDENT_AMBULATORY_CARE_PROVIDER_SITE_OTHER): Payer: BLUE CROSS/BLUE SHIELD | Admitting: Family Medicine

## 2016-06-15 ENCOUNTER — Encounter: Payer: Self-pay | Admitting: Family Medicine

## 2016-06-15 VITALS — BP 106/64 | HR 87 | Temp 98.1°F | Resp 16 | Ht 68.0 in | Wt 150.0 lb

## 2016-06-15 DIAGNOSIS — J449 Chronic obstructive pulmonary disease, unspecified: Secondary | ICD-10-CM

## 2016-06-15 DIAGNOSIS — J441 Chronic obstructive pulmonary disease with (acute) exacerbation: Secondary | ICD-10-CM | POA: Diagnosis not present

## 2016-06-15 DIAGNOSIS — E119 Type 2 diabetes mellitus without complications: Secondary | ICD-10-CM | POA: Diagnosis not present

## 2016-06-15 DIAGNOSIS — E7800 Pure hypercholesterolemia, unspecified: Secondary | ICD-10-CM

## 2016-06-15 LAB — COMPREHENSIVE METABOLIC PANEL
ALK PHOS: 60 U/L (ref 40–115)
ALT: 15 U/L (ref 9–46)
AST: 16 U/L (ref 10–35)
Albumin: 4.4 g/dL (ref 3.6–5.1)
BILIRUBIN TOTAL: 0.3 mg/dL (ref 0.2–1.2)
BUN: 13 mg/dL (ref 7–25)
CO2: 27 mmol/L (ref 20–31)
CREATININE: 0.79 mg/dL (ref 0.70–1.25)
Calcium: 10 mg/dL (ref 8.6–10.3)
Chloride: 101 mmol/L (ref 98–110)
GLUCOSE: 78 mg/dL (ref 65–99)
Potassium: 4.5 mmol/L (ref 3.5–5.3)
SODIUM: 139 mmol/L (ref 135–146)
Total Protein: 6.4 g/dL (ref 6.1–8.1)

## 2016-06-15 LAB — CBC WITH DIFFERENTIAL/PLATELET
BASOS PCT: 1 %
Basophils Absolute: 73 cells/uL (ref 0–200)
EOS PCT: 4 %
Eosinophils Absolute: 292 cells/uL (ref 15–500)
HEMATOCRIT: 43.4 % (ref 38.5–50.0)
Hemoglobin: 14.8 g/dL (ref 13.2–17.1)
LYMPHS PCT: 30 %
Lymphs Abs: 2190 cells/uL (ref 850–3900)
MCH: 29.6 pg (ref 27.0–33.0)
MCHC: 34.1 g/dL (ref 32.0–36.0)
MCV: 86.8 fL (ref 80.0–100.0)
MONO ABS: 803 {cells}/uL (ref 200–950)
MONOS PCT: 11 %
MPV: 9.7 fL (ref 7.5–12.5)
Neutro Abs: 3942 cells/uL (ref 1500–7800)
Neutrophils Relative %: 54 %
PLATELETS: 247 10*3/uL (ref 140–400)
RBC: 5 MIL/uL (ref 4.20–5.80)
RDW: 13.3 % (ref 11.0–15.0)
WBC: 7.3 10*3/uL (ref 3.8–10.8)

## 2016-06-15 LAB — LIPID PANEL
Cholesterol: 132 mg/dL (ref 125–200)
HDL: 69 mg/dL (ref >=40)
LDL CALC: 40 mg/dL (ref <130)
Total CHOL/HDL Ratio: 1.9 Ratio (ref <=5.0)
Triglycerides: 115 mg/dL (ref <150)
VLDL: 23 mg/dL (ref <30)

## 2016-06-15 LAB — GLUCOSE, POCT (MANUAL RESULT ENTRY): POC Glucose: 104 mg/dl — AB (ref 70–99)

## 2016-06-15 LAB — POCT GLYCOSYLATED HEMOGLOBIN (HGB A1C): Hemoglobin A1C: 6.3

## 2016-06-15 IMAGING — CR DG CHEST 2V
3 series · 3 of 3 positions shown · non-contrast
Comparison: [DATE].

CLINICAL DATA: COPD.

EXAM:
CHEST  2 VIEW

[PA (1 of 2)]
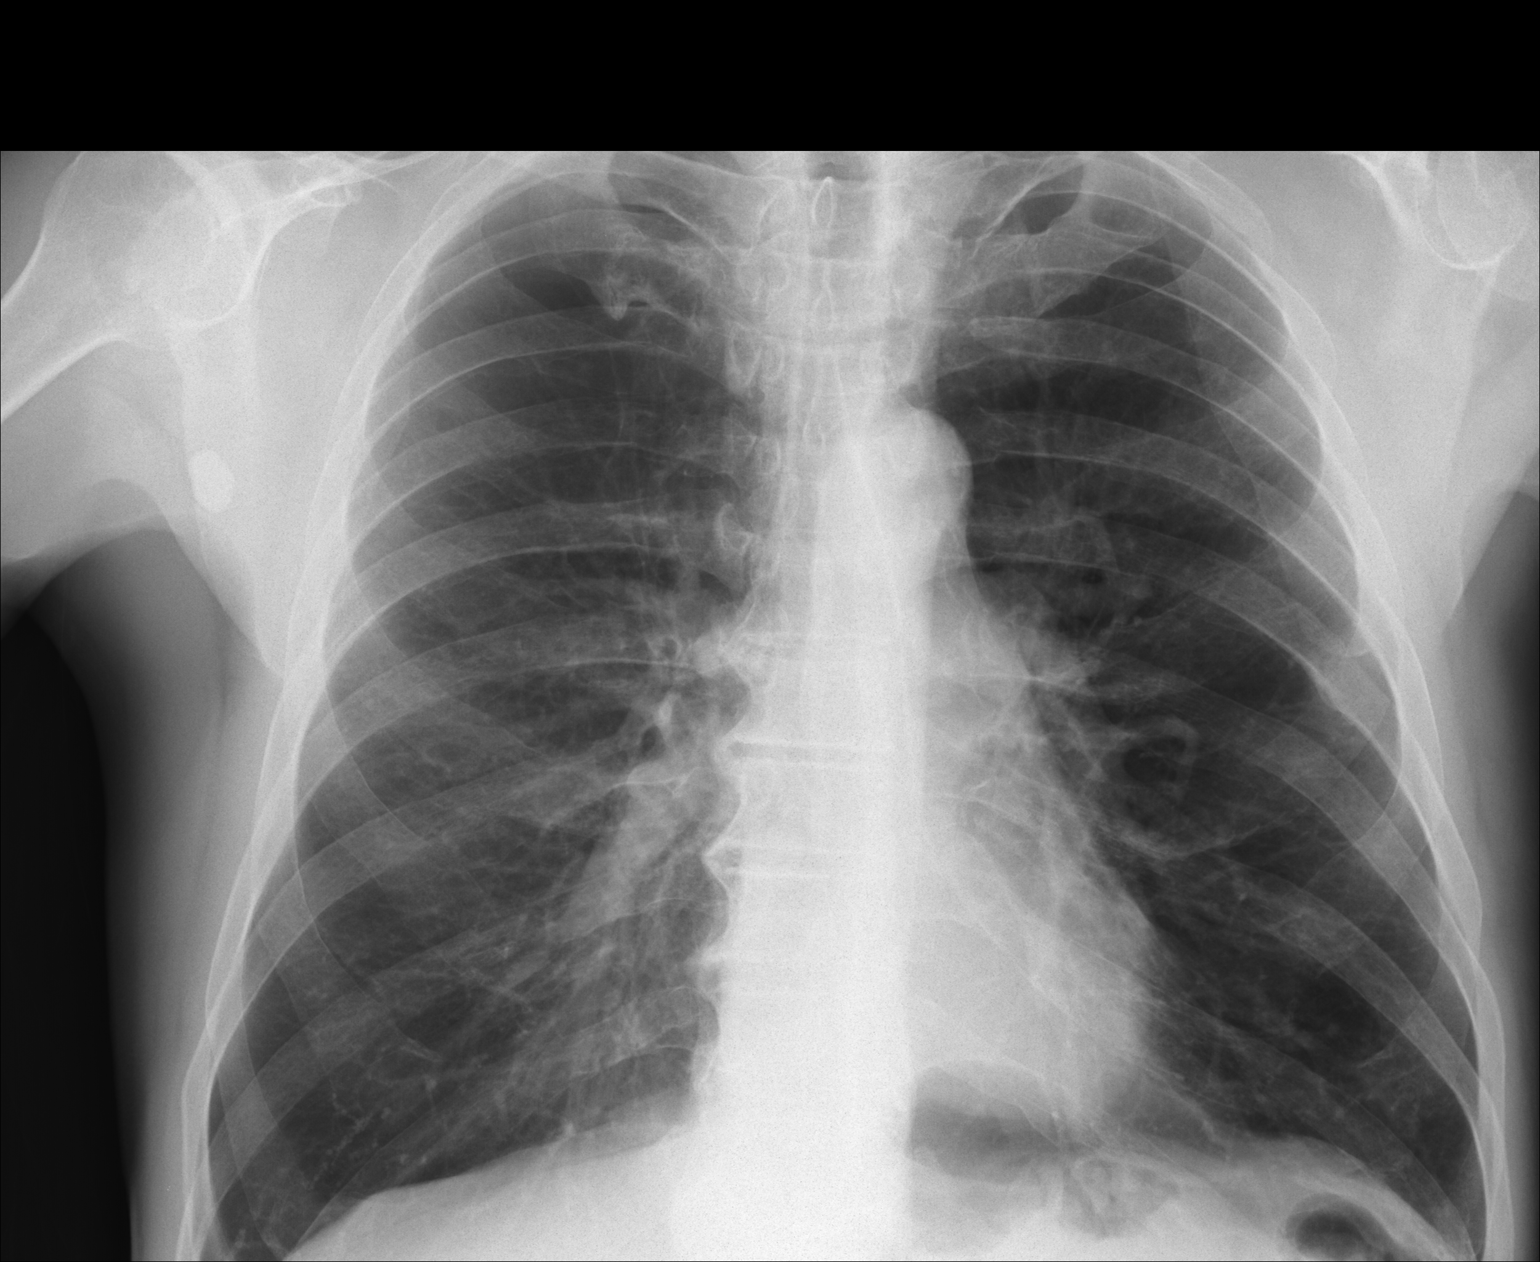

[lateral]
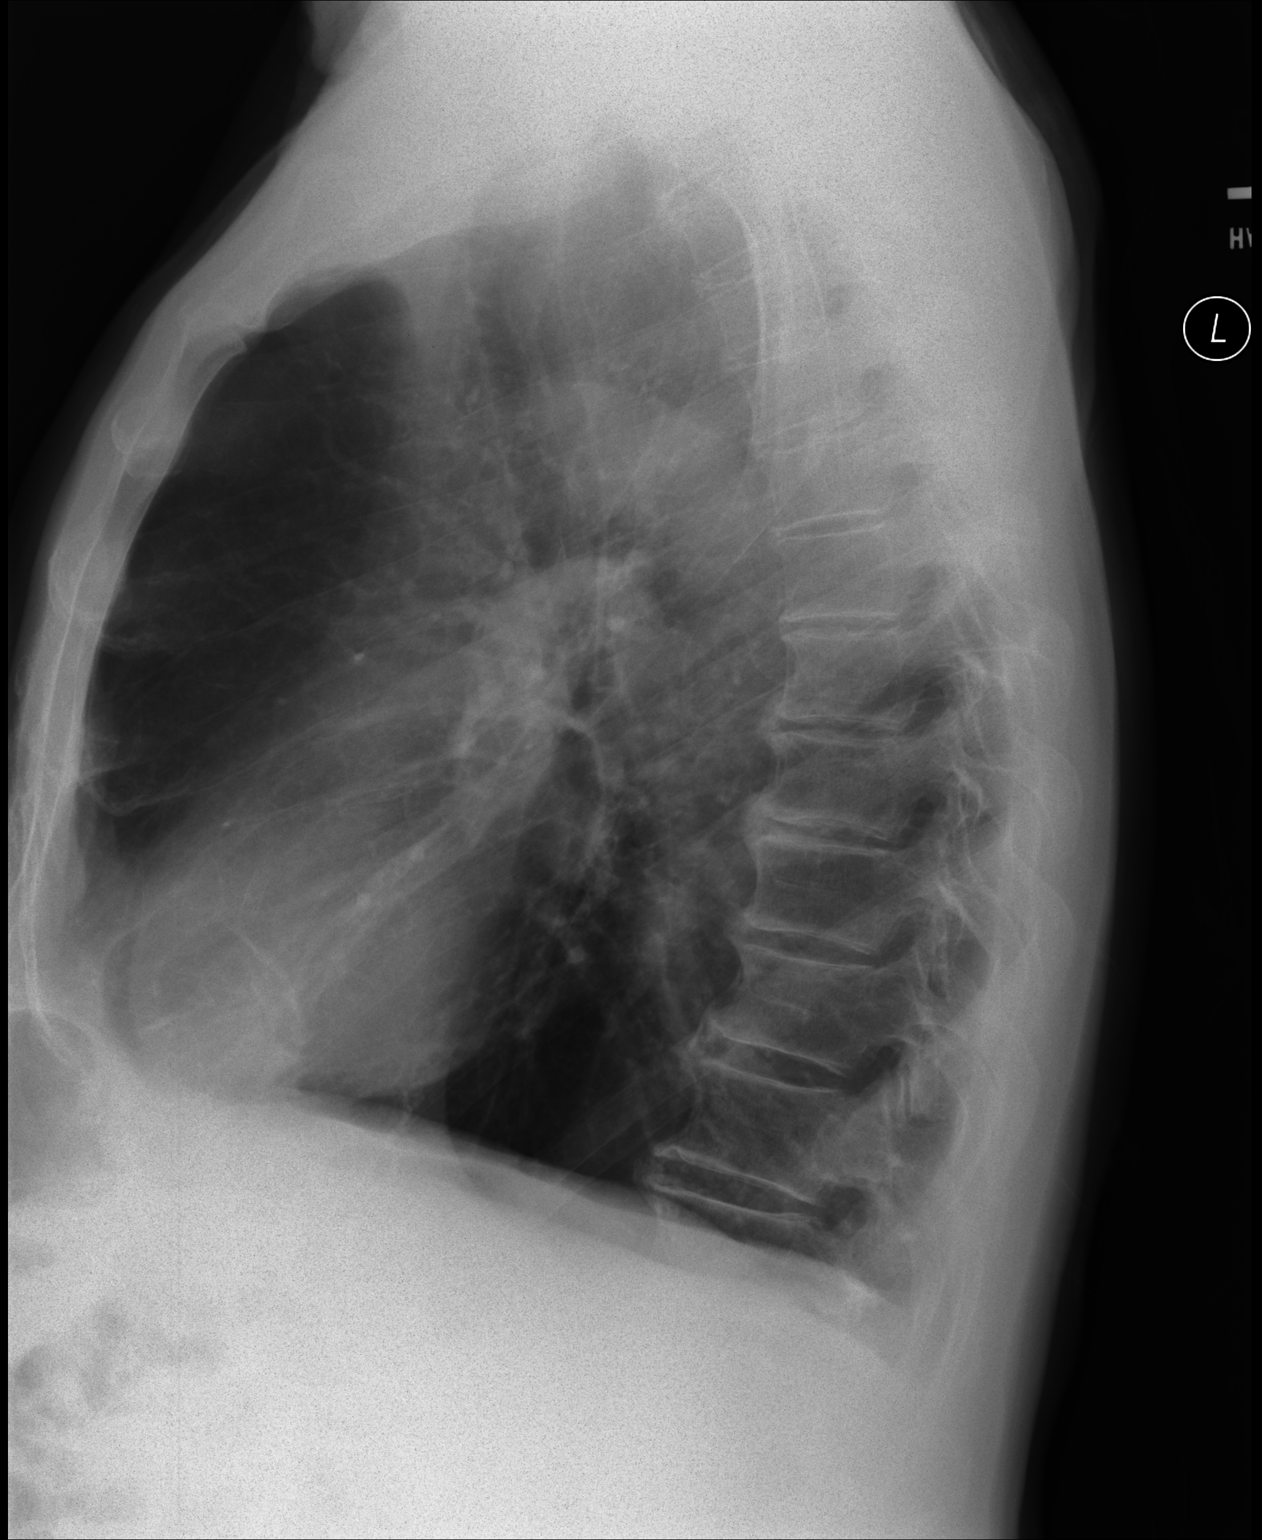

[PA (2 of 2)]
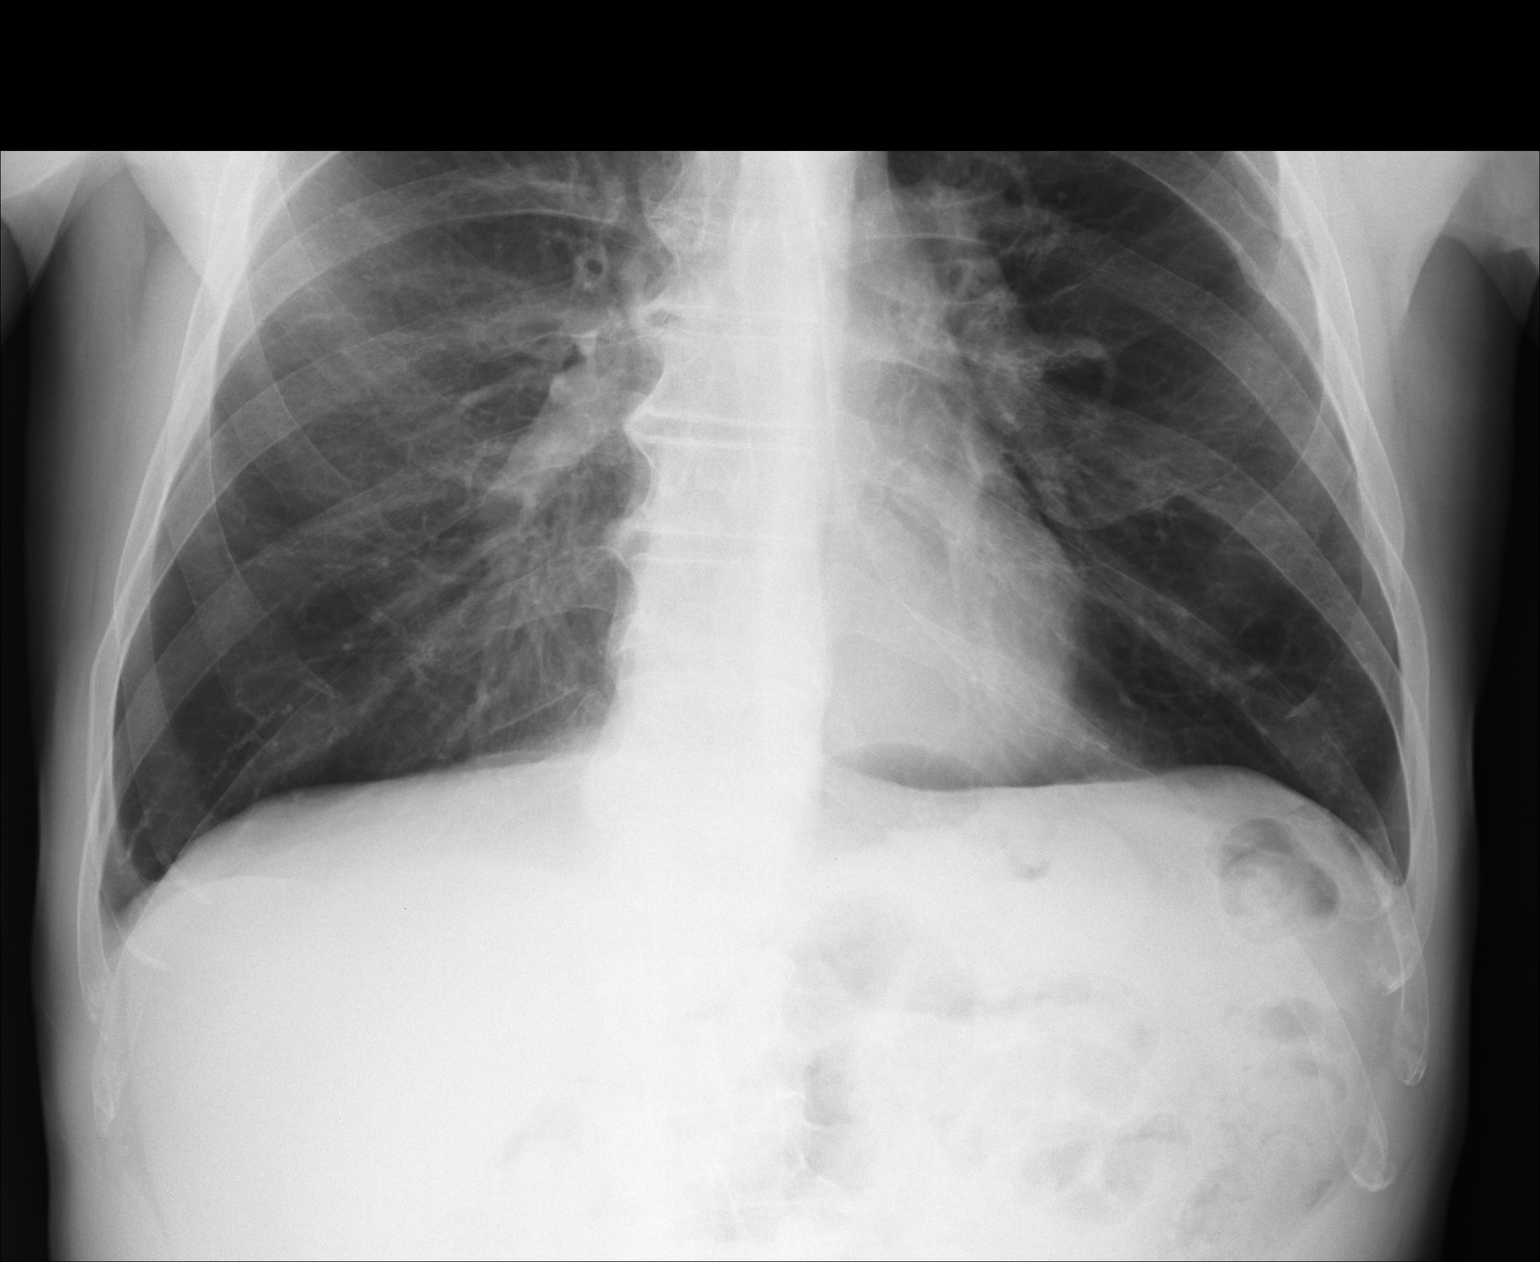

[3 of 3 positions shown; findings below may reference images not displayed]

FINDINGS: Mediastinum and hilar structures normal. Bullous COPD. No focal
infiltrate. No pleural effusion or pneumothorax. No acute bony
abnormality. Heart size normal. Degenerative changes thoracic spine.
IMPRESSION: 1. Bullous COPD.

2.  No acute cardiopulmonary disease.

## 2016-06-15 MED ORDER — PREDNISONE 20 MG PO TABS: ORAL_TABLET | ORAL | Status: DC
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
## 2016-06-15 NOTE — Patient Instructions (Addendum)
   IF you received an x-ray today, you will receive an invoice from [facility]. Please contact [facility] at [phone] with questions or concerns regarding your invoice.   IF you received labwork today, you will receive an invoice from [facility]. Please contact [facility] at [phone] with questions or concerns regarding your invoice.   Our billing staff will not be able to assist you with questions regarding bills from these companies.  You will be contacted with the lab results as soon as they are available. The fastest way to get your results is to activate your My Chart account. Instructions are located on the last page of this paperwork. If you have not heard from us regarding the results in 2 weeks, please contact this office.    Chronic Obstructive Pulmonary Disease Chronic obstructive pulmonary disease (COPD) is a common lung condition in which airflow from the lungs is limited. COPD is a general term that can be used to describe many different lung problems that limit airflow, including both chronic bronchitis and emphysema. If you have COPD, your lung function will probably never return to normal, but there are measures you can take to improve lung function and make yourself feel better. CAUSES   Smoking (common).  Exposure to secondhand smoke.  Genetic problems.  Chronic inflammatory lung diseases or recurrent infections. SYMPTOMS  Shortness of breath, especially with physical activity.  Deep, persistent (chronic) cough with a large amount of thick mucus.  Wheezing.  Rapid breaths (tachypnea).  Gray or bluish discoloration (cyanosis) of the skin, especially in your fingers, toes, or lips.  Fatigue.  Weight loss.  Frequent infections or episodes when breathing symptoms become much worse (exacerbations).  Chest tightness. DIAGNOSIS Your health care provider will take a medical history and perform a physical  examination to diagnose COPD. Additional tests for COPD may include:  Lung (pulmonary) function tests.  Chest X-ray.  CT scan.  Blood tests. TREATMENT  Treatment for COPD may include:  Inhaler and nebulizer medicines. These help manage the symptoms of COPD and make your breathing more comfortable.  Supplemental oxygen. Supplemental oxygen is only helpful if you have a low oxygen level in your blood.  Exercise and physical activity. These are beneficial for nearly all people with COPD.  Lung surgery or transplant.  Nutrition therapy to gain weight, if you are underweight.  Pulmonary rehabilitation. This may involve working with a team of health care providers and specialists, such as respiratory, occupational, and physical therapists. HOME CARE INSTRUCTIONS  Take all medicines (inhaled or pills) as directed by your health care provider.  Avoid over-the-counter medicines or cough syrups that dry up your airway (such as antihistamines) and slow down the elimination of secretions unless instructed otherwise by your health care provider.  If you are a smoker, the most important thing that you can do is stop smoking. Continuing to smoke will cause further lung damage and breathing trouble. Ask your health care provider for help with quitting smoking. He or she can direct you to community resources or hospitals that provide support.  Avoid exposure to irritants such as smoke, chemicals, and fumes that aggravate your breathing.  Use oxygen therapy and pulmonary rehabilitation if directed by your health care provider. If you require home oxygen therapy, ask your health care provider whether you should purchase a pulse oximeter to measure your oxygen level at home.  Avoid contact with individuals who have a contagious illness.  Avoid extreme temperature and humidity changes.    Eat healthy foods. Eating smaller, more frequent meals and resting before meals may help you maintain your  strength.  Stay active, but balance activity with periods of rest. Exercise and physical activity will help you maintain your ability to do things you want to do.  Preventing infection and hospitalization is very important when you have COPD. Make sure to receive all the vaccines your health care provider recommends, especially the pneumococcal and influenza vaccines. Ask your health care provider whether you need a pneumonia vaccine.  Learn and use relaxation techniques to manage stress.  Learn and use controlled breathing techniques as directed by your health care provider. Controlled breathing techniques include:  Pursed lip breathing. Start by breathing in (inhaling) through your nose for 1 second. Then, purse your lips as if you were going to whistle and breathe out (exhale) through the pursed lips for 2 seconds.  Diaphragmatic breathing. Start by putting one hand on your abdomen just above your waist. Inhale slowly through your nose. The hand on your abdomen should move out. Then purse your lips and exhale slowly. You should be able to feel the hand on your abdomen moving in as you exhale.  Learn and use controlled coughing to clear mucus from your lungs. Controlled coughing is a series of short, progressive coughs. The steps of controlled coughing are: 1. Lean your head slightly forward. 2. Breathe in deeply using diaphragmatic breathing. 3. Try to hold your breath for 3 seconds. 4. Keep your mouth slightly open while coughing twice. 5. Spit any mucus out into a tissue. 6. Rest and repeat the steps once or twice as needed. SEEK MEDICAL CARE IF:  You are coughing up more mucus than usual.  There is a change in the color or thickness of your mucus.  Your breathing is more labored than usual.  Your breathing is faster than usual. SEEK IMMEDIATE MEDICAL CARE IF:  You have shortness of breath while you are resting.  You have shortness of breath that prevents you from:  Being  able to talk.  Performing your usual physical activities.  You have chest pain lasting longer than 5 minutes.  Your skin color is more cyanotic than usual.  You measure low oxygen saturations for longer than 5 minutes with a pulse oximeter. MAKE SURE YOU:  Understand these instructions.  Will watch your condition.  Will get help right away if you are not doing well or get worse.   This information is not intended to replace advice given to you by your health care provider. Make sure you discuss any questions you have with your health care provider.   Document Released: 09/15/2005 Document Revised: 12/27/2014 Document Reviewed: 08/02/2013 Elsevier Interactive Patient Education 2016 Elsevier Inc.  

## 2016-06-17 ENCOUNTER — Telehealth: Payer: Self-pay

## 2016-06-17 MED ORDER — PREDNISONE 20 MG PO TABS: ORAL_TABLET | ORAL | Status: DC

## 2016-06-17 NOTE — Telephone Encounter (Signed)
Patient was seen Tuesday and the pharmacy did not receive the prescription for prednisone. The prescription failed to go through. Please resend!  [address] [facility]

## 2016-06-17 NOTE — Telephone Encounter (Signed)
Re-sent pred Rx and notified pt's wife.

## 2016-07-02 ENCOUNTER — Other Ambulatory Visit: Payer: Self-pay | Admitting: Family Medicine

## 2016-07-13 ENCOUNTER — Ambulatory Visit: Payer: BLUE CROSS/BLUE SHIELD | Admitting: Family Medicine

## 2016-07-19 ENCOUNTER — Other Ambulatory Visit: Payer: Self-pay | Admitting: Family Medicine

## 2016-07-27 ENCOUNTER — Ambulatory Visit (INDEPENDENT_AMBULATORY_CARE_PROVIDER_SITE_OTHER): Payer: BLUE CROSS/BLUE SHIELD

## 2016-07-27 ENCOUNTER — Ambulatory Visit (INDEPENDENT_AMBULATORY_CARE_PROVIDER_SITE_OTHER): Payer: BLUE CROSS/BLUE SHIELD | Admitting: Family Medicine

## 2016-07-27 ENCOUNTER — Telehealth: Payer: Self-pay | Admitting: *Deleted

## 2016-07-27 ENCOUNTER — Encounter: Payer: Self-pay | Admitting: Family Medicine

## 2016-07-27 VITALS — BP 120/74 | HR 89 | Temp 97.9°F | Resp 17 | Ht 68.0 in | Wt 153.0 lb

## 2016-07-27 DIAGNOSIS — J988 Other specified respiratory disorders: Secondary | ICD-10-CM

## 2016-07-27 DIAGNOSIS — J22 Unspecified acute lower respiratory infection: Secondary | ICD-10-CM

## 2016-07-27 DIAGNOSIS — R05 Cough: Secondary | ICD-10-CM

## 2016-07-27 DIAGNOSIS — E119 Type 2 diabetes mellitus without complications: Secondary | ICD-10-CM

## 2016-07-27 DIAGNOSIS — J441 Chronic obstructive pulmonary disease with (acute) exacerbation: Secondary | ICD-10-CM | POA: Diagnosis not present

## 2016-07-27 DIAGNOSIS — R059 Cough, unspecified: Secondary | ICD-10-CM

## 2016-07-27 DIAGNOSIS — R0602 Shortness of breath: Secondary | ICD-10-CM

## 2016-07-27 DIAGNOSIS — I2510 Atherosclerotic heart disease of native coronary artery without angina pectoris: Secondary | ICD-10-CM

## 2016-07-27 DIAGNOSIS — I5022 Chronic systolic (congestive) heart failure: Secondary | ICD-10-CM

## 2016-07-27 LAB — POCT CBC
GRANULOCYTE PERCENT: 63.2 % (ref 37–80)
HCT, POC: 41.3 % — AB (ref 43.5–53.7)
Hemoglobin: 14.2 g/dL (ref 14.1–18.1)
Lymph, poc: 2.3 (ref 0.6–3.4)
MCH: 30 pg (ref 27–31.2)
MCHC: 34.3 g/dL (ref 31.8–35.4)
MCV: 87.3 fL (ref 80–97)
MID (CBC): 0.7 (ref 0–0.9)
MPV: 6.9 fL (ref 0–99.8)
POC GRANULOCYTE: 5.2 (ref 2–6.9)
POC LYMPH PERCENT: 28.1 %L (ref 10–50)
POC MID %: 8.7 % (ref 0–12)
Platelet Count, POC: 233 10*3/uL (ref 142–424)
RBC: 4.72 M/uL (ref 4.69–6.13)
RDW, POC: 13.6 %
WBC: 8.2 10*3/uL (ref 4.6–10.2)

## 2016-07-27 LAB — GLUCOSE, POCT (MANUAL RESULT ENTRY): POC Glucose: 81 mg/dl (ref 70–99)

## 2016-07-27 IMAGING — DX DG CHEST 2V
3 series · 3 of 3 positions shown · non-contrast
Comparison: [DATE].

CLINICAL DATA: Chest tightness with burning coughing and sweats.

EXAM:
CHEST  2 VIEW

[chest pa (1 of 2)]
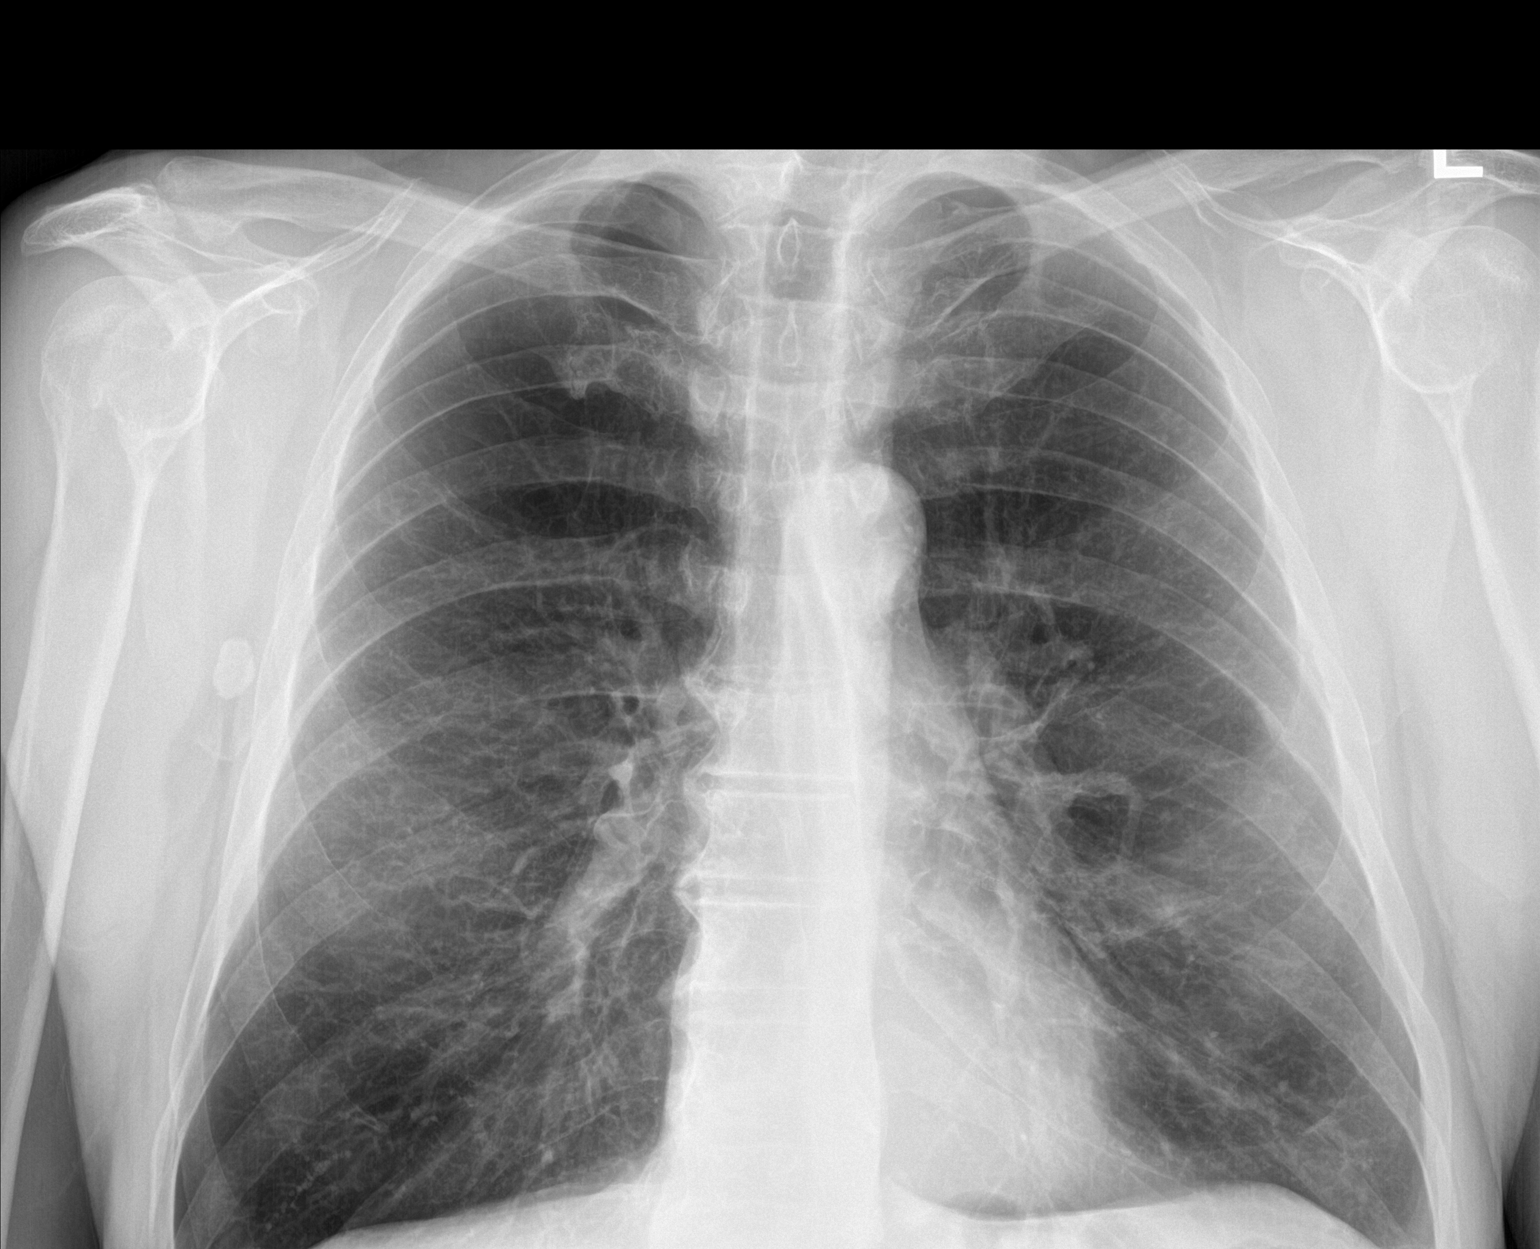

[chest lat]
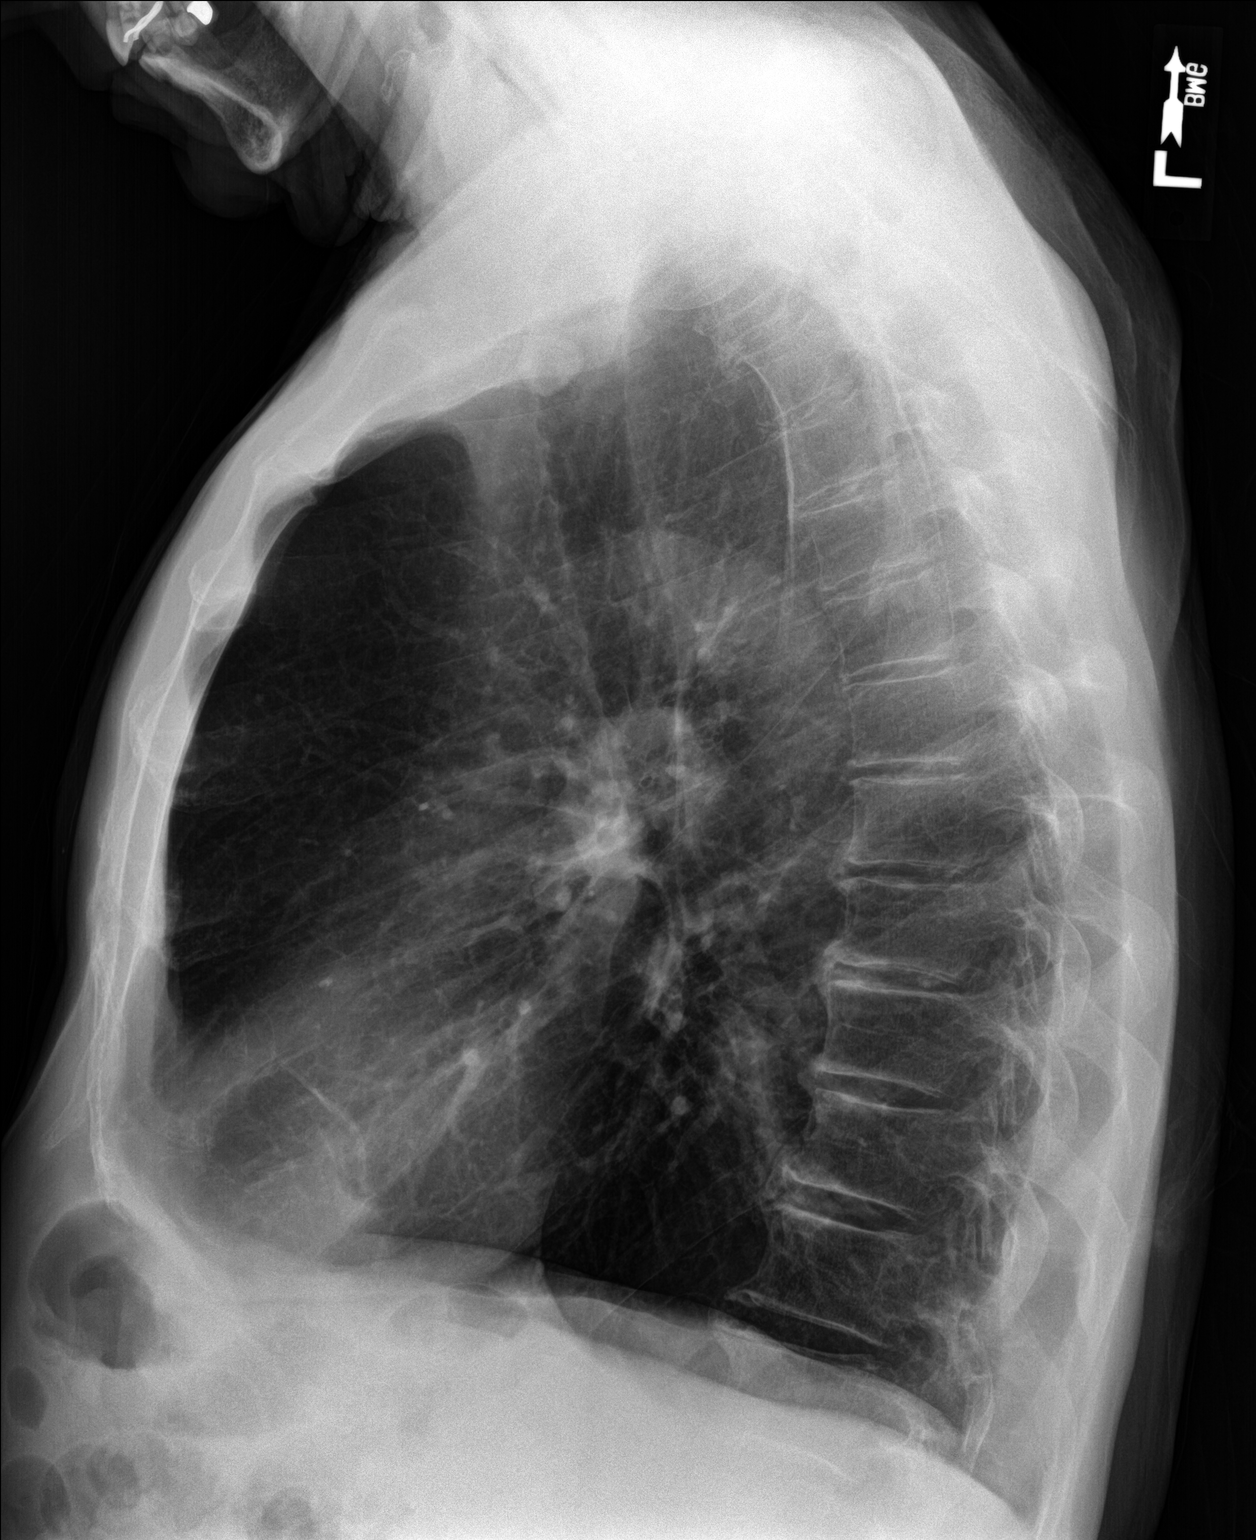

[chest pa (2 of 2)]
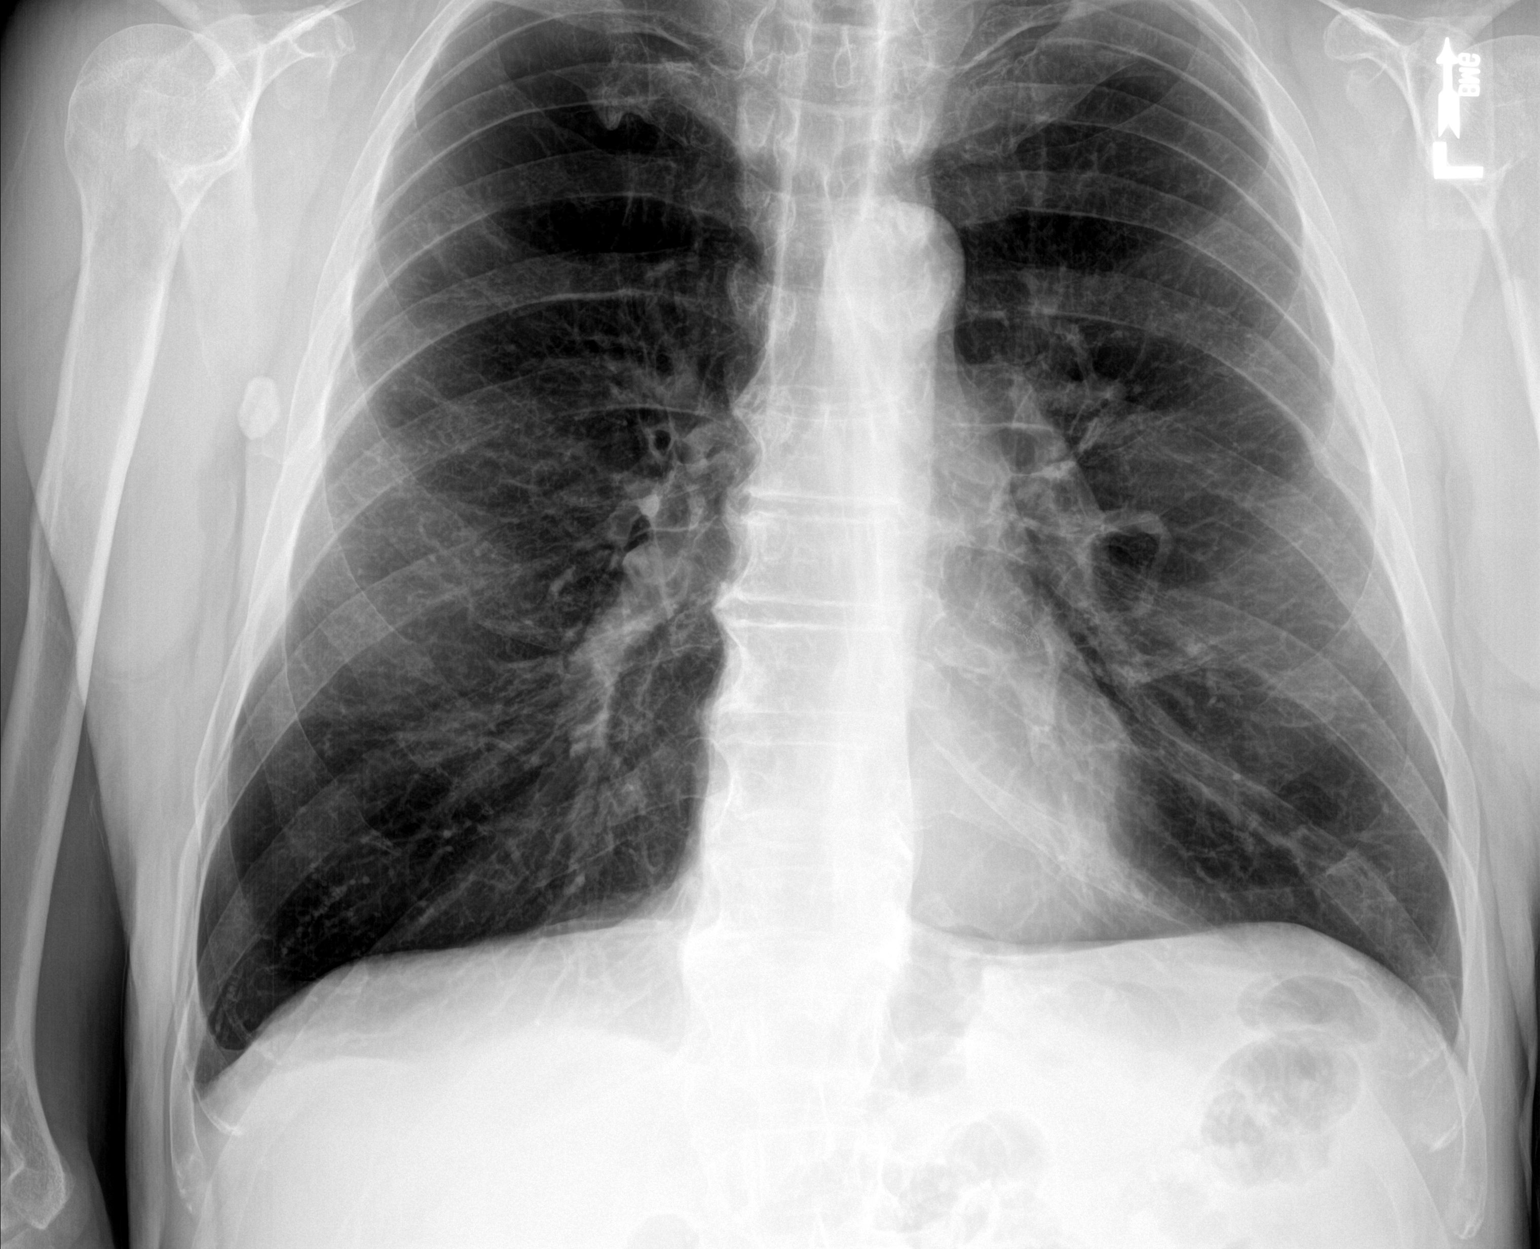

[3 of 3 positions shown; findings below may reference images not displayed]

FINDINGS: Hyperexpansion is consistent with emphysema. The lungs are clear
wiithout focal pneumonia, edema, pneumothorax or pleural effusion.
Dense calcification in the right axilla again noted.
IMPRESSION: Emphysema without acute findings.

## 2016-07-27 MED ORDER — ALBUTEROL SULFATE (2.5 MG/3ML) 0.083% IN NEBU
5.0000 mg | INHALATION_SOLUTION | Freq: Once | RESPIRATORY_TRACT | Status: AC
  Administered 2016-07-27: 5 mg via RESPIRATORY_TRACT

## 2016-07-27 MED ORDER — PREDNISONE 20 MG PO TABS: ORAL_TABLET | ORAL | 0 refills | Status: DC

## 2016-07-27 MED ORDER — TIOTROPIUM BROMIDE MONOHYDRATE 18 MCG IN CAPS: 18.0000 ug | ORAL_CAPSULE | Freq: Every day | RESPIRATORY_TRACT | 3 refills | Status: DC

## 2016-07-27 MED ORDER — FLUTICASONE-SALMETEROL 500-50 MCG/DOSE IN AEPB: 1.0000 | INHALATION_SPRAY | Freq: Two times a day (BID) | RESPIRATORY_TRACT | 3 refills | Status: DC

## 2016-07-27 MED ORDER — DOXYCYCLINE HYCLATE 100 MG PO CAPS: 100.0000 mg | ORAL_CAPSULE | Freq: Two times a day (BID) | ORAL | 0 refills | Status: DC

## 2016-07-27 NOTE — Patient Instructions (Signed)
     IF you received an x-ray today, you will receive an invoice from [facility]. Please contact [facility] at [phone] with questions or concerns regarding your invoice.   IF you received labwork today, you will receive an invoice from [facility]. Please contact [facility] at [phone] with questions or concerns regarding your invoice.   Our billing staff will not be able to assist you with questions regarding bills from these companies.  You will be contacted with the lab results as soon as they are available. The fastest way to get your results is to activate your My Chart account. Instructions are located on the last page of this paperwork. If you have not heard from us regarding the results in 2 weeks, please contact this office.      

## 2016-07-27 NOTE — Telephone Encounter (Signed)
Dr. Tamala Julian would like for referrals to call and check the status on his referral to pulmonology.  It looks like we did our part and sent to their workque.

## 2016-07-27 NOTE — Progress Notes (Signed)
Patient ID: Leslie Sims, male   DOB: 05/08/1951, 65 y.o.   MRN: [id number]   By signing my name below I, Tereasa Coop, attest that this documentation has been prepared under the direction and in the presence of [name] MD. Electonically Signed. Tereasa Coop, Scribe 07/27/2016 at 5:26 PM   Subjective:    Patient ID: Leslie Sims, male    DOB: 1950-12-24, 65 y.o.   MRN: [id number]  07/27/2016  Shortness of Breath and Chest Pain (x1week. Centralized "tightness")   HPI Jamire Macnab is a 65 y.o. male who presents to the [facility] complaining of SOB and chest tightness that was worsening after onset a week ago and started improving over the past 2 days. SOB is so bad he has to sit down in the shower. Pt also reports mildly productive cough with clear phlegm, wheezing, sinus congestion, diaphoresis, HA, and runny nose. Wheezing is worsened at night. Pt denies ear pain, or fever. Pt has history of COPD and is an active smoker. Pt has been taking, albuterol, advair, mucinex, and flonase to control symptoms. Pt has also been using his nebulizer 6 times a day. Last nebulizer was 2.5 hrs ago. Pt reports being around family member that was sick 2 weeks ago. Symptoms also started after he was up in an attic that has insulation that was making his friend sick.   Pt denies any CP. Pt reports chest tightness and chest burning from cough. Pt states it is a very different sensation from his MI 8 years ago.  Pt also has history of CAD  Pt also reports getting "down about [his] health", and having some anxiety.  Pt just bought a camper and is excited about going camping.  Review of Systems  Constitutional: Positive for diaphoresis. Negative for activity change, appetite change, chills, fatigue and fever.  HENT: Positive for congestion and rhinorrhea. Negative for ear pain, sore throat, tinnitus, trouble swallowing and voice change.   Respiratory: Positive for  cough, shortness of breath and wheezing.   Cardiovascular: Negative for chest pain, palpitations and leg swelling.  Gastrointestinal: Negative for abdominal pain, diarrhea, nausea and vomiting.  Endocrine: Negative for cold intolerance, heat intolerance, polydipsia, polyphagia and polyuria.  Skin: Negative for color change, rash and wound.  Neurological: Positive for headaches. Negative for dizziness, tremors, seizures, syncope, facial asymmetry, speech difficulty, weakness, light-headedness and numbness.  Psychiatric/Behavioral: Negative for dysphoric mood and sleep disturbance. The patient is nervous/anxious.     Past Medical History:  Diagnosis Date  . Allergic rhinitis   . Anxiety   . Atherosclerosis   . Back pain   . Bronchitis    09-14-11  . CARDIOMYOPATHY 03/24/2010   did not tolerate Toprol and Lisinopril.   . Chest pain   . Chronic airway obstruction, not elsewhere classified   . Colon polyps 08/2012   colonoscopy; multiple colon polyps; repeat colonoscopy in one year.  [name] COPD (chronic obstructive pulmonary disease) ([place])   . Coronary artery disease 11/2009   Anterior MI with late presentation 11/2009. Cath showed a 100% proximal LAD occlusion, 99% mid RCA. Had a PCI and 2 DES to both LAD and RCA. EF was 35%. Nuclear stress test 05/13: prior anterior/inferior infarcts without ischemic, EF 43%, cardiac cath in 02/14: Widely patent stents with no other obstructive disease. EF 40%   . Depression   . Diabetes mellitus without complication ([place])   . GERD (gastroesophageal reflux disease)   . Headache(784.0)   .  Helicobacter pylori (H. pylori)   . Hemorrhoids   . Hernia    inguinal  . Hyperlipidemia   . Hypertension   . Insomnia   . MI (myocardial infarction) ([place])   . Myalgia   . Peptic ulcer   . Shortness of breath dyspnea   . Status post dilation of esophageal narrowing 2000  . Thyroid dysfunction   . Tinea pedis   . Wears dentures    partial upper   Past Surgical  History:  Procedure Laterality Date  . Admission  12/20/2012   COPD exacerbation.  [place].  [name] CARDIAC CATHETERIZATION    . CARDIAC CATHETERIZATION  02/05/13   [facility]  . CARDIAC CATHETERIZATION  10/14   [facility] : patent stents with no change in anatomy. EF: 40$  . COLONOSCOPY    . COLONOSCOPY WITH PROPOFOL N/A 05/18/2016   Procedure: COLONOSCOPY WITH PROPOFOL;  Surgeon: Lucilla Lame, MD;  Location: [facility] ENDOSCOPY;  Service: Endoscopy;  Laterality: N/A;  . CORONARY ANGIOPLASTY WITH STENT PLACEMENT  09/2009   LAD 3.0 X23 mm Xience DES, RCA: 4.0 X 15 mm Xience DES  . ESOPHAGEAL DILATION    . ESOPHAGOGASTRODUODENOSCOPY  2008  . ESOPHAGOGASTRODUODENOSCOPY  08/20/2012  . HERNIA REPAIR  07/20/2012   L inguinal hernia repair  . PENILE PROSTHESIS IMPLANT     Allergies  Allergen Reactions  . Wellbutrin [Bupropion] Other (See Comments)    Unknown/"made me feel real funny"    Social History   Social History  . Marital status: Married    Spouse name: N/A  . Number of children: 5  . Years of education: N/A   Occupational History  . 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 Smoking status: Current Every Day Smoker    Packs/day: 1.50    Years: 42.00    Types: Cigarettes  . Smokeless tobacco: Never Used  . Alcohol use No  . Drug use: No  . Sexual activity: Yes   Other Topics Concern  . Not on file   Social History Narrative   Marital status: married x 32 years; happily married.      Children: 4 children;  1 stepchild and 15 grandchildren; no great grandchildren.      Lives: with wife; 2 dogs, 1 cat.      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      Tobacco: 1 ppd x 45 years      Alcohol: none; previous alcoholism      Drugs: none      Exercise: no formal exercise; physically demanding job; owns horses.      Seatbelts:  100%      Guns: one loaded unsecured gun in the home.      Sunscreen: none   Family History  Problem Relation Age of  Onset  . Heart attack Brother     Brother #1  . Diabetes Brother     brother #2  . Hypertension Brother     #3  . Coronary artery disease Father 52    deceased  . Heart attack Father   . Diabetes Father   . Heart disease Father   . COPD Mother 80    deceased  . Heart attack Sister 77    acute-cocaine abuse/deceased  . Alcohol abuse Sister     polysubstance abuse  . COPD Sister   . Alcohol abuse Sister     polysubstance abuse  . Heart disease Brother  AMI x multiple       Objective:    BP 120/74 (BP Location: Left Arm, Patient Position: Sitting, Cuff Size: Normal)   Pulse 89   Temp 97.9 F (36.6 C) (Oral)   Resp 17   Ht 5\' 8"  (1.727 m)   Wt 153 lb (69.4 kg)   SpO2 95%   BMI 23.26 kg/m  Physical Exam  Constitutional: He is oriented to person, place, and time. He appears well-developed and well-nourished. No distress.  HENT:  Head: Normocephalic and atraumatic.  Right Ear: Hearing, tympanic membrane, external ear and ear canal normal.  Left Ear: Hearing, tympanic membrane, external ear and ear canal normal.  Nose: Nose normal. Right sinus exhibits no maxillary sinus tenderness and no frontal sinus tenderness. Left sinus exhibits no maxillary sinus tenderness and no frontal sinus tenderness.  Mouth/Throat: Oropharynx is clear and moist and mucous membranes are normal. No oropharyngeal exudate or posterior oropharyngeal erythema.  Eyes: Conjunctivae and EOM are normal. Pupils are equal, round, and reactive to light.  Neck: Normal range of motion. Neck supple. Carotid bruit is not present. No thyromegaly present.  Cardiovascular: Normal rate, regular rhythm, normal heart sounds and intact distal pulses.  Exam reveals no gallop and no friction rub.   No murmur heard. Pulmonary/Chest: Effort normal. No respiratory distress. He has decreased breath sounds (distant breath sounds throughout. ). He has no wheezes. He has no rhonchi. He has no rales.  Lymphadenopathy:    He  has no cervical adenopathy.  Neurological: He is alert and oriented to person, place, and time. No cranial nerve deficit.  Skin: Skin is warm and dry. No rash noted. He is not diaphoretic.  Psychiatric: He has a normal mood and affect. His behavior is normal.  Nursing note and vitals reviewed.  Results for orders placed or performed in visit on 07/27/16  POCT CBC  Result Value Ref Range   WBC 8.2 4.6 - 10.2 K/uL   Lymph, poc 2.3 0.6 - 3.4   POC LYMPH PERCENT 28.1 10 - 50 %L   MID (cbc) 0.7 0 - 0.9   POC MID % 8.7 0 - 12 %M   POC Granulocyte 5.2 2 - 6.9   Granulocyte percent 63.2 37 - 80 %G   RBC 4.72 4.69 - 6.13 M/uL   Hemoglobin 14.2 14.1 - 18.1 g/dL   HCT, POC 41.3 (A) 43.5 - 53.7 %   MCV 87.3 80 - 97 fL   MCH, POC 30.0 27 - 31.2 pg   MCHC 34.3 31.8 - 35.4 g/dL   RDW, POC 13.6 %   Platelet Count, POC 233 142 - 424 K/uL   MPV 6.9 0 - 99.8 fL  POCT glucose (manual entry)  Result Value Ref Range   POC Glucose 81 70 - 99 mg/dl   EKG viewed and interpreted by Dr Tamala Julian. EKG shows normal sinus rhythm with no acute changes. EKG compared to October 2016.   Dg Chest 2 View  Result Date: 07/27/2016 CLINICAL DATA:  Chest tightness with burning coughing and sweats. EXAM: CHEST  2 VIEW COMPARISON:  06/15/2016. FINDINGS: Hyperexpansion is consistent with emphysema. The lungs are clear wiithout focal pneumonia, edema, pneumothorax or pleural effusion. Dense calcification in the right axilla again noted. IMPRESSION: Emphysema without acute findings. Electronically Signed   By: Misty Stanley M.D.   On: 07/27/2016 17:06        Assessment & Plan:   1. COPD exacerbation ([place])   2. Lower respiratory infection  3. Cough   4. Shortness of breath   5. Type 2 diabetes mellitus without complication, without long-term current use of insulin ([place])   6. Atherosclerosis of native coronary artery of native heart without angina pectoris   7. Chronic systolic heart failure ([place])     Orders Placed  This Encounter  Procedures  . DG Chest 2 View    Standing Status:   Future    Number of Occurrences:   1    Standing Expiration Date:   07/27/2017    Order Specific Question:   Reason for Exam (SYMPTOM  OR DIAGNOSIS REQUIRED)    Answer:   chest tightness, burning, coughing, sweats    Order Specific Question:   Preferred imaging location?    Answer:   External  . POCT CBC  . POCT glucose (manual entry)  . EKG 12-Lead   Meds ordered this encounter  Medications  . albuterol (PROVENTIL) (2.5 MG/3ML) 0.083% nebulizer solution 5 mg  . DISCONTD: predniSONE (DELTASONE) 20 MG tablet    Sig: Take 3 tablets daily x 2 days then 2 tablets daily x 5 days then 1 tablet daily x 5 days    Dispense:  21 tablet    Refill:  0  . DISCONTD: doxycycline (VIBRAMYCIN) 100 MG capsule    Sig: Take 1 capsule (100 mg total) by mouth 2 (two) times daily.    Dispense:  20 capsule    Refill:  0  . tiotropium (SPIRIVA HANDIHALER) 18 MCG inhalation capsule    Sig: Place 1 capsule (18 mcg total) into inhaler and inhale daily.    Dispense:  90 capsule    Refill:  3  . Fluticasone-Salmeterol (ADVAIR DISKUS) 500-50 MCG/DOSE AEPB    Sig: Inhale 1 puff into the lungs 2 (two) times daily.    Dispense:  180 each    Refill:  3   Will treat pt for COPD exacerbation and place pt on prednisone and give pt a prescription of doxycycline to use if prednisone does not resolve pt's symptoms.   new prescription for pt's advair and spiriva.  Continue nebulizer every 4-6 hours.  To ED for acute decline.   No Follow-up on file.   I personally performed the services described in this documentation, which was scribed in my presence. The recorded information has been reviewed and considered.  Khalea Ventura Elayne Guerin, M.D. [facility] [address] [place], [place]  [address] [phone] phone [phone] fax

## 2016-07-29 NOTE — Telephone Encounter (Signed)
The patient is scheduled at [facility] at [facility] on 08/10/16.

## 2016-08-01 ENCOUNTER — Encounter: Payer: Self-pay | Admitting: Family Medicine

## 2016-08-10 ENCOUNTER — Encounter: Payer: Self-pay | Admitting: Pulmonary Disease

## 2016-08-10 ENCOUNTER — Encounter (INDEPENDENT_AMBULATORY_CARE_PROVIDER_SITE_OTHER): Payer: Self-pay

## 2016-08-10 ENCOUNTER — Other Ambulatory Visit: Payer: Self-pay

## 2016-08-10 ENCOUNTER — Ambulatory Visit (INDEPENDENT_AMBULATORY_CARE_PROVIDER_SITE_OTHER): Payer: BLUE CROSS/BLUE SHIELD | Admitting: Pulmonary Disease

## 2016-08-10 VITALS — BP 132/68 | HR 72 | Ht 70.0 in | Wt 152.0 lb

## 2016-08-10 DIAGNOSIS — Z720 Tobacco use: Secondary | ICD-10-CM

## 2016-08-10 DIAGNOSIS — J42 Unspecified chronic bronchitis: Secondary | ICD-10-CM

## 2016-08-10 DIAGNOSIS — J441 Chronic obstructive pulmonary disease with (acute) exacerbation: Secondary | ICD-10-CM | POA: Diagnosis not present

## 2016-08-10 DIAGNOSIS — J449 Chronic obstructive pulmonary disease, unspecified: Secondary | ICD-10-CM | POA: Diagnosis not present

## 2016-08-10 DIAGNOSIS — F17200 Nicotine dependence, unspecified, uncomplicated: Secondary | ICD-10-CM

## 2016-08-10 DIAGNOSIS — J439 Emphysema, unspecified: Secondary | ICD-10-CM

## 2016-08-10 MED ORDER — SERTRALINE HCL 100 MG PO TABS: 100.0000 mg | ORAL_TABLET | Freq: Every day | ORAL | 1 refills | Status: DC

## 2016-08-10 MED ORDER — NICOTINE 21 MG/24HR TD PT24: 21.0000 mg | MEDICATED_PATCH | Freq: Every day | TRANSDERMAL | 12 refills | Status: DC

## 2016-08-10 NOTE — Patient Instructions (Signed)
You have very severe COPD with both emphysema and chronic bronchitis  You must quit smoking if we are to derive any benefit from our medical therapies  I have recommended nicotine replacement therapy    Nicotine patch (21 mg) - place in morning and remove @ bedtime    Continue Nicotrol inhaler as prescribed by Dr Tamala Julian  Continue Advair and Spiriva as previously  Follow up in 6 weeks  Over time we will work on refining your COPD medical regimen

## 2016-08-15 NOTE — Progress Notes (Signed)
PULMONARY CONSULT NOTE  Requesting MD/Service: [name], MD Date of initial consultation: 08/10/16 Reason for consultation: COPD, smoker  PT PROFILE: 37 M smoker with severe emphysema seen by multiple pulmonologists in past ([place], [place], [place], [place]) referred for further eval and mgmt of COPD   DATA: 07/24/12 Office Spirometry: Severe obstruction (FEV1 31% pred) 08/31/13 Office Spirometry: very severe obstruction (FEV1 25% pred) 06/18/15 CT chest: Severe emphysema 07/27/16 CXR: Severe hyperinflation and hyperlucency   HPI:  14 M smoker with severe emphysema seen by multiple pulmonologists in past ([place], [place], [facility] [employer], [place]) referred for further eval and mgmt of COPD after being treated by Dr Tamala Julian recently for a COPD exacerbation. At his baseline, he has class II dyspnea per his description but he does seem to have compensated for his pulmonary limitations and it is likely that it is worse than this. He reports daily cough productive of yellow-greem mucus. He has never had hemoptysis. His SOB is made worse by extreme heat and humidity. There is day to day variability in his DOE. 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 . Despite his very severe emphysema and obstruction demonstrated on prior PFTs, he continues to smoke. He is maintained on Advair and Spiriva.   Past Medical History:  Diagnosis Date  . Allergic rhinitis   . Anxiety   . Atherosclerosis   . Back pain   . Bronchitis    09-14-11  . CARDIOMYOPATHY 03/24/2010   did not tolerate Toprol and Lisinopril.   . Chest pain   . Chronic airway obstruction, not elsewhere classified   . Colon polyps 08/2012   colonoscopy; multiple colon polyps; repeat colonoscopy in one year.  [name] COPD (chronic obstructive pulmonary disease) ([place])   . Coronary artery disease 11/2009   Anterior MI with late presentation 11/2009. Cath showed a 100% proximal LAD occlusion, 99% mid RCA. Had a PCI and 2 DES to both LAD  and RCA. EF was 35%. Nuclear stress test 05/13: prior anterior/inferior infarcts without ischemic, EF 43%, cardiac cath in 02/14: Widely patent stents with no other obstructive disease. EF 40%   . Depression   . Diabetes mellitus without complication ([place])   . GERD (gastroesophageal reflux disease)   . Headache(784.0)   . Helicobacter pylori (H. pylori)   . Hemorrhoids   . Hernia    inguinal  . Hyperlipidemia   . Hypertension   . Insomnia   . MI (myocardial infarction) ([place])   . Myalgia   . Peptic ulcer   . Shortness of breath dyspnea   . Status post dilation of esophageal narrowing 2000  . Thyroid dysfunction   . Tinea pedis   . Wears dentures    partial upper    Past Surgical History:  Procedure Laterality Date  . Admission  12/20/2012   COPD exacerbation.  [place].  [name] CARDIAC CATHETERIZATION    . CARDIAC CATHETERIZATION  02/05/13   [facility]  . CARDIAC CATHETERIZATION  10/14   [facility] : patent stents with no change in anatomy. EF: 40$  . COLONOSCOPY    . COLONOSCOPY WITH PROPOFOL N/A 05/18/2016   Procedure: COLONOSCOPY WITH PROPOFOL;  Surgeon: Lucilla Lame, MD;  Location: [facility] ENDOSCOPY;  Service: Endoscopy;  Laterality: N/A;  . CORONARY ANGIOPLASTY WITH STENT PLACEMENT  09/2009   LAD 3.0 X23 mm Xience DES, RCA: 4.0 X 15 mm Xience DES  . ESOPHAGEAL DILATION    . ESOPHAGOGASTRODUODENOSCOPY  2008  . ESOPHAGOGASTRODUODENOSCOPY  08/20/2012  . HERNIA REPAIR  07/20/2012   L inguinal hernia repair  . PENILE PROSTHESIS IMPLANT      MEDICATIONS: I have reviewed all medications and confirmed regimen as documented  Social History   Social History  . Marital status: Married    Spouse name: N/A  . Number of children: 5  . Years of education: N/A   Occupational History  . 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 Smoking status: Current Every Day Smoker    Packs/day: 1.50    Years: 42.00    Types: Cigarettes  . Smokeless tobacco: Never Used  . Alcohol  use No  . Drug use: No  . Sexual activity: Yes   Other Topics Concern  . Not on file   Social History Narrative   Marital status: married x 32 years; happily married.      Children: 4 children;  1 stepchild and 15 grandchildren; no great grandchildren.      Lives: with wife; 2 dogs, 1 cat.      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      Tobacco: 1 ppd x 45 years      Alcohol: none; previous alcoholism      Drugs: none      Exercise: no formal exercise; physically demanding job; owns horses.      Seatbelts:  100%      Guns: one loaded unsecured gun in the home.      Sunscreen: none    Family History  Problem Relation Age of Onset  . Heart attack Brother     Brother #1  . Diabetes Brother     brother #2  . Hypertension Brother     #3  . Coronary artery disease Father 68    deceased  . Heart attack Father   . Diabetes Father   . Heart disease Father   . COPD Mother 75    deceased  . Heart attack Sister 23    acute-cocaine abuse/deceased  . Alcohol abuse Sister     polysubstance abuse  . COPD Sister   . Alcohol abuse Sister     polysubstance abuse  . Heart disease Brother     AMI x multiple    ROS: No fever, myalgias/arthralgias, unexplained weight loss or weight gain No new focal weakness or sensory deficits No otalgia, hearing loss, visual changes, nasal and sinus symptoms, mouth and throat problems No neck pain or adenopathy No abdominal pain, N/V/D, diarrhea, change in bowel pattern No dysuria, change in urinary pattern   Vitals:   08/10/16 1134  BP: 132/68  Pulse: 72  SpO2: 97%  Weight: 152 lb (68.9 kg)  Height: 5\' 10"  (1.778 m)     EXAM:  Gen: WDWN, Appears much older than his true age, No overt respiratory distress HEENT: NCAT, sclera white, oropharynx normal Neck: Supple without LAN, thyromegaly, JVD Lungs: breath sounds: moderately diminished, percussion: hyperresonant , scattered wheezes Cardiovascular: RRR, no murmurs  noted Abdomen: Soft, nontender, normal BS Ext: without clubbing, cyanosis, edema Neuro: CNs grossly intact, motor and sensory intact Skin: Limited exam, no lesions noted  DATA:   BMP Latest Ref Rng & Units 06/15/2016 03/02/2016 10/20/2015  Glucose 65 - 99 mg/dL 78 96 89  BUN 7 - 25 mg/dL 13 15 10   Creatinine 0.70 - 1.25 mg/dL 0.79 0.86 0.82  BUN/Creat Ratio 10 - 22 - - -  Sodium 135 - 146 mmol/L 139 142 141  Potassium 3.5 - 5.3  mmol/L 4.5 4.1 4.3  Chloride 98 - 110 mmol/L 101 99 102  CO2 20 - 31 mmol/L 27 33(H) 31  Calcium 8.6 - 10.3 mg/dL 10.0 9.6 9.3    CBC Latest Ref Rng & Units 07/27/2016 06/15/2016 03/02/2016  WBC 4.6 - 10.2 K/uL 8.2 7.3 7.7  Hemoglobin 14.1 - 18.1 g/dL 14.2 14.8 14.3  Hematocrit 43.5 - 53.7 % 41.3(A) 43.4 42.7  Platelets 140 - 400 K/uL - 247 231    CXR:  As above  IMPRESSION:     ICD-9-CM ICD-10-CM   1. Smoker 305.1 Z72.0   2. COPD, very severe ([place]) 496 J44.9   3. Pulmonary emphysema, unspecified emphysema type ([place]) 492.8 J43.9   4. Recurrent COPD exacerbations 491.21 J44.1   5. Chronic bronchitis, unspecified chronic bronchitis type ([place]) 491.9 J42      PLAN:  1) I counseled in detail about the imperative of smoking cessation and strategies. I recommended NRT with 21 mg patches and that he continue to use the Nicotrol inhaler as prescribed by Dr Tamala Julian. The ultimate goal is for him to be liberated from nicotine addiction but for now. I have recommended that he use as much NRT as is necessary for him to abstain from cigarettes 2) Continue Advair and Spiriva as well as PRN albuterol 3) ROV in 6 weeks   Merton Border, MD PCCM service Mobile [phone] Pager [phone] 08/15/2016

## 2016-08-24 ENCOUNTER — Encounter: Payer: Self-pay | Admitting: Family Medicine

## 2016-08-24 ENCOUNTER — Ambulatory Visit (INDEPENDENT_AMBULATORY_CARE_PROVIDER_SITE_OTHER): Payer: BLUE CROSS/BLUE SHIELD | Admitting: Family Medicine

## 2016-08-24 VITALS — BP 104/68 | HR 88 | Temp 98.2°F | Resp 18 | Ht 70.0 in | Wt 154.0 lb

## 2016-08-24 DIAGNOSIS — E119 Type 2 diabetes mellitus without complications: Secondary | ICD-10-CM | POA: Diagnosis not present

## 2016-08-24 DIAGNOSIS — E785 Hyperlipidemia, unspecified: Secondary | ICD-10-CM

## 2016-08-24 DIAGNOSIS — I10 Essential (primary) hypertension: Secondary | ICD-10-CM

## 2016-08-24 DIAGNOSIS — J449 Chronic obstructive pulmonary disease, unspecified: Secondary | ICD-10-CM

## 2016-08-24 DIAGNOSIS — F341 Dysthymic disorder: Secondary | ICD-10-CM

## 2016-08-24 DIAGNOSIS — F17200 Nicotine dependence, unspecified, uncomplicated: Secondary | ICD-10-CM | POA: Diagnosis not present

## 2016-08-24 DIAGNOSIS — Z23 Encounter for immunization: Secondary | ICD-10-CM | POA: Diagnosis not present

## 2016-08-24 LAB — COMPREHENSIVE METABOLIC PANEL
ALT: 16 U/L (ref 9–46)
AST: 14 U/L (ref 10–35)
Albumin: 4.2 g/dL (ref 3.6–5.1)
Alkaline Phosphatase: 59 U/L (ref 40–115)
BILIRUBIN TOTAL: 0.4 mg/dL (ref 0.2–1.2)
BUN: 8 mg/dL (ref 7–25)
CALCIUM: 9.4 mg/dL (ref 8.6–10.3)
CO2: 30 mmol/L (ref 20–31)
CREATININE: 0.9 mg/dL (ref 0.70–1.25)
Chloride: 102 mmol/L (ref 98–110)
GLUCOSE: 112 mg/dL — AB (ref 65–99)
POTASSIUM: 4 mmol/L (ref 3.5–5.3)
Sodium: 140 mmol/L (ref 135–146)
TOTAL PROTEIN: 6.2 g/dL (ref 6.1–8.1)

## 2016-08-24 LAB — CBC WITH DIFFERENTIAL/PLATELET
BASOS PCT: 1 %
Basophils Absolute: 61 cells/uL (ref 0–200)
EOS ABS: 305 {cells}/uL (ref 15–500)
Eosinophils Relative: 5 %
HEMATOCRIT: 41.8 % (ref 38.5–50.0)
Hemoglobin: 14.4 g/dL (ref 13.2–17.1)
Lymphocytes Relative: 28 %
Lymphs Abs: 1708 cells/uL (ref 850–3900)
MCH: 30.3 pg (ref 27.0–33.0)
MCHC: 34.4 g/dL (ref 32.0–36.0)
MCV: 88 fL (ref 80.0–100.0)
MONO ABS: 732 {cells}/uL (ref 200–950)
MPV: 9.6 fL (ref 7.5–12.5)
Monocytes Relative: 12 %
NEUTROS ABS: 3294 {cells}/uL (ref 1500–7800)
Neutrophils Relative %: 54 %
Platelets: 232 10*3/uL (ref 140–400)
RBC: 4.75 MIL/uL (ref 4.20–5.80)
RDW: 13.3 % (ref 11.0–15.0)
WBC: 6.1 10*3/uL (ref 3.8–10.8)

## 2016-08-24 LAB — LIPID PANEL
Cholesterol: 123 mg/dL — ABNORMAL LOW (ref 125–200)
HDL: 68 mg/dL (ref >=40)
LDL CALC: 30 mg/dL (ref <130)
TRIGLYCERIDES: 126 mg/dL (ref <150)
Total CHOL/HDL Ratio: 1.8 Ratio (ref <=5.0)
VLDL: 25 mg/dL (ref <30)

## 2016-08-24 MED ORDER — PREDNISONE 20 MG PO TABS: ORAL_TABLET | ORAL | 0 refills | Status: DC

## 2016-08-24 NOTE — Progress Notes (Signed)
Subjective:    Patient ID: Leslie Sims, male    DOB: 01-18-1951, 65 y.o.   MRN: [id number]  08/24/2016  Follow-up (copd/[facility])  By signing my name below, I, Leslie Sims, attest that this documentation has been prepared under the direction and in the presence of Sonia Baller, MD. Electronically Signed: Judithe Sims, ER Scribe. 08/24/2016. 10:38 PM.  HPI HPI Comments: Leslie Sims is a 65 y.o. male who presents to [facility] reporting for a follow up for COPD. Pt saw Dr. [name] on 08/22 to establish for severe COPD. He states he is feeling fatigued. He is not sleeping well. He has been sleeping in a recliner because he gets SOB when he sleeps in his bed. He gets scared when he gets SOB in his bed. He states he feels like giving up at times because his breathing is so difficult. He is is taking one sertraline per day. He denies SI. He is using patches and nicotrol inhaler. He is using his rescue inhaler 5-6 times per day. He has been having rhinorrhea in the morning. His next appointment with his cardiologist is in November, 2017. He always waits until October to take his flu shot. He states his bowels have been moving well. He only has GERD sx when he eats spicy food at night. He denies chest pain, but states he has occasional chest discomfort that he believes is related to his COPD. He reports compliance with Metformin for diabetes.      Review of Systems  Constitutional: Negative for activity change, appetite change, chills, diaphoresis, fatigue and fever.  HENT: Positive for congestion and rhinorrhea. Negative for sore throat, trouble swallowing and voice change.   Respiratory: Positive for shortness of breath and wheezing. Negative for cough.   Cardiovascular: Negative for chest pain, palpitations and leg swelling.  Gastrointestinal: Negative for abdominal pain, diarrhea, nausea and vomiting.  Endocrine: Negative for cold intolerance, heat intolerance, polydipsia,  polyphagia and polyuria.  Skin: Negative for color change, rash and wound.  Neurological: Negative for dizziness, tremors, seizures, syncope, facial asymmetry, speech difficulty, weakness, light-headedness, numbness and headaches.  Psychiatric/Behavioral: Positive for dysphoric mood. Negative for sleep disturbance. The patient is not nervous/anxious.     Past Medical History:  Diagnosis Date  . Allergic rhinitis   . Anxiety   . Atherosclerosis   . Back pain   . Bronchitis    09-14-11  . CARDIOMYOPATHY 03/24/2010   did not tolerate Toprol and Lisinopril.   . Chest pain   . Chronic airway obstruction, not elsewhere classified   . Colon polyps 08/2012   colonoscopy; multiple colon polyps; repeat colonoscopy in one year.  [name] COPD (chronic obstructive pulmonary disease) ([place])   . Coronary artery disease 11/2009   Anterior MI with late presentation 11/2009. Cath showed a 100% proximal LAD occlusion, 99% mid RCA. Had a PCI and 2 DES to both LAD and RCA. EF was 35%. Nuclear stress test 05/13: prior anterior/inferior infarcts without ischemic, EF 43%, cardiac cath in 02/14: Widely patent stents with no other obstructive disease. EF 40%   . Depression   . Diabetes mellitus without complication ([place])   . GERD (gastroesophageal reflux disease)   . Headache(784.0)   . Helicobacter pylori (H. pylori)   . Hemorrhoids   . Hernia    inguinal  . Hyperlipidemia   . Hypertension   . Insomnia   . MI (myocardial infarction) ([place])   . Myalgia   . Peptic ulcer   .  Shortness of breath dyspnea   . Status post dilation of esophageal narrowing 2000  . Thyroid dysfunction   . Tinea pedis   . Wears dentures    partial upper   Past Surgical History:  Procedure Laterality Date  . Admission  12/20/2012   COPD exacerbation.  [address].  [name] CARDIAC CATHETERIZATION    . CARDIAC CATHETERIZATION  02/05/13   [facility]  . CARDIAC CATHETERIZATION  10/14   [facility] : patent stents with no change in anatomy. EF: 40$  .  COLONOSCOPY    . COLONOSCOPY WITH PROPOFOL N/A 05/18/2016   Procedure: COLONOSCOPY WITH PROPOFOL;  Surgeon: Lucilla Lame, MD;  Location: [facility] ENDOSCOPY;  Service: Endoscopy;  Laterality: N/A;  . CORONARY ANGIOPLASTY WITH STENT PLACEMENT  09/2009   LAD 3.0 X23 mm Xience DES, RCA: 4.0 X 15 mm Xience DES  . ESOPHAGEAL DILATION    . ESOPHAGOGASTRODUODENOSCOPY  2008  . ESOPHAGOGASTRODUODENOSCOPY  08/20/2012  . HERNIA REPAIR  07/20/2012   L inguinal hernia repair  . PENILE PROSTHESIS IMPLANT     Allergies  Allergen Reactions  . Wellbutrin [Bupropion] Other (See Comments)    Unknown/"made me feel real funny"   Current Outpatient Prescriptions  Medication Sig Dispense Refill  . albuterol (PROVENTIL) (2.5 MG/3ML) 0.083% nebulizer solution Take 3 mLs (2.5 mg total) by nebulization every 6 (six) hours as needed for wheezing or shortness of breath. 360 vial 4  . aspirin 81 MG tablet Take 81 mg by mouth daily.      [name] atorvastatin (LIPITOR) 40 MG tablet Take 1 tablet (40 mg total) by mouth daily. 90 tablet 3  . benzonatate (TESSALON) 200 MG capsule Take 1 capsule (200 mg total) by mouth 3 (three) times daily as needed for cough. 20 capsule 0  . CORTIFOAM 10 % rectal foam PLACE ONE APPLICATION RECTALLY TWICE DAILY 15 g 5  . fluticasone (FLONASE) 50 MCG/ACT nasal spray Place 1 spray into both nostrils 2 (two) times daily. 16 g 0  . Fluticasone-Salmeterol (ADVAIR DISKUS) 500-50 MCG/DOSE AEPB Inhale 1 puff into the lungs 2 (two) times daily. 180 each 3  . LORazepam (ATIVAN) 0.5 MG tablet Take 1 tablet (0.5 mg total) by mouth daily as needed for anxiety. 30 tablet 3  . losartan (COZAAR) 25 MG tablet Take 1 tablet (25 mg total) by mouth daily. 90 tablet 3  . metFORMIN (GLUCOPHAGE) 500 MG tablet TAKE 1 TABLET DAILY AFTER SUPPER 90 tablet 0  . Multiple Vitamin (MULTIVITAMIN) capsule Take 1 capsule by mouth daily.    . nicotine (NICODERM CQ - DOSED IN MG/24 HOURS) 21 mg/24hr patch Place 1 patch (21 mg total) onto  the skin daily. 28 patch 12  . NICOTROL 10 MG inhaler INHALE CONTENTS OF 1 CARTRIDGE (CONTINUOUS PUFFING) INTO THE LUNGS AS NEEDED FOR SMOKING CESSATION ( 168 each 4  . nitroGLYCERIN (NITROSTAT) 0.4 MG SL tablet Place 1 tablet (0.4 mg total) under the tongue every 5 (five) minutes as needed. 25 tablet 3  . pantoprazole (PROTONIX) 40 MG tablet Take 1 tablet (40 mg total) by mouth 2 (two) times daily. 180 tablet 3  . sertraline (ZOLOFT) 100 MG tablet Take 1-2 tablets (100-200 mg total) by mouth daily. 180 tablet 1  . sucralfate (CARAFATE) 1 GM/10ML suspension TAKE 10ML( 2 TEASPOONSFUL) BY MOUTH FOUR TIMES DAILY 3600 mL 1  . tiotropium (SPIRIVA HANDIHALER) 18 MCG inhalation capsule Place 1 capsule (18 mcg total) into inhaler and inhale daily. 90 capsule 3  . VENTOLIN HFA 108 (  90 Base) MCG/ACT inhaler USE 2 INHALATIONS INTO  THE LUNGS  EVERY 6 HOURS AS NEEDED FOR WHEEZING 54 g 1  . predniSONE (DELTASONE) 20 MG tablet Two tablets daily x 5 days then one tablet daily x 5 days 15 tablet 0  . sodium chloride (OCEAN) 0.65 % SOLN nasal spray Place 2 sprays into both nostrils every 2 (two) hours while awake.  0   No current facility-administered medications for this visit.    Social History   Social History  . Marital status: Married    Spouse name: N/A  . Number of children: 5  . Years of education: N/A   Occupational History  . 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 Smoking status: Current Every Day Smoker    Packs/day: 1.50    Years: 42.00    Types: Cigarettes  . Smokeless tobacco: Never Used  . Alcohol use No  . Drug use: No  . Sexual activity: Yes   Other Topics Concern  . Not on file   Social History Narrative   Marital status: married x 32 years; happily married.      Children: 4 children;  1 stepchild and 15 grandchildren; no great grandchildren.      Lives: with wife; 2 dogs, 1 cat.      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      Tobacco: 1 ppd x 45 years      Alcohol: none; previous alcoholism      Drugs: none      Exercise: no formal exercise; physically demanding job; owns horses.      Seatbelts:  100%      Guns: one loaded unsecured gun in the home.      Sunscreen: none   Family History  Problem Relation Age of Onset  . Heart attack Brother     Brother #1  . Diabetes Brother     brother #2  . Hypertension Brother     #3  . Coronary artery disease Father 46    deceased  . Heart attack Father   . Diabetes Father   . Heart disease Father   . COPD Mother 86    deceased  . Heart attack Sister 46    acute-cocaine abuse/deceased  . Alcohol abuse Sister     polysubstance abuse  . COPD Sister   . Alcohol abuse Sister     polysubstance abuse  . Heart disease Brother     AMI x multiple       Objective:    BP 104/68 (BP Location: Right Arm, Patient Position: Sitting, Cuff Size: Small)   Pulse 88   Temp 98.2 F (36.8 C) (Oral)   Resp 18   Ht 5' 10"  (1.778 m)   Wt 154 lb (69.9 kg)   SpO2 94%   BMI 22.10 kg/m  Physical Exam  Constitutional: He is oriented to person, place, and time. He appears well-developed and well-nourished. No distress.  HENT:  Head: Normocephalic and atraumatic.  Right Ear: External ear normal.  Left Ear: External ear normal.  Nose: Nose normal.  Mouth/Throat: Oropharynx is clear and moist.  Eyes: Conjunctivae and EOM are normal. Pupils are equal, round, and reactive to light.  Neck: Normal range of motion. Neck supple. Carotid bruit is not present. No thyromegaly present.  Cardiovascular: Normal rate, regular rhythm, normal heart sounds and intact distal pulses.  Exam reveals no gallop and no friction rub.  No murmur heard. Pulmonary/Chest: Effort normal and breath sounds normal. No respiratory distress. He has no wheezes. He has no rales.  Abdominal: Soft. Bowel sounds are normal. He exhibits no distension and no mass. There is no tenderness. There is no  rebound and no guarding.  Musculoskeletal: Normal range of motion.  Lymphadenopathy:    He has no cervical adenopathy.  Neurological: He is alert and oriented to person, place, and time. No cranial nerve deficit. Coordination normal.  Skin: Skin is warm and dry. No rash noted. He is not diaphoretic.  Psychiatric: He has a normal mood and affect. His behavior is normal.  Nursing note and vitals reviewed.  Results for orders placed or performed in visit on 08/24/16  CBC with Differential/Platelet  Result Value Ref Range   WBC 6.1 3.8 - 10.8 K/uL   RBC 4.75 4.20 - 5.80 MIL/uL   Hemoglobin 14.4 13.2 - 17.1 g/dL   HCT 41.8 38.5 - 50.0 %   MCV 88.0 80.0 - 100.0 fL   MCH 30.3 27.0 - 33.0 pg   MCHC 34.4 32.0 - 36.0 g/dL   RDW 13.3 11.0 - 15.0 %   Platelets 232 140 - 400 K/uL   MPV 9.6 7.5 - 12.5 fL   Neutro Abs 3,294 1,500 - 7,800 cells/uL   Lymphs Abs 1,708 850 - 3,900 cells/uL   Monocytes Absolute 732 200 - 950 cells/uL   Eosinophils Absolute 305 15 - 500 cells/uL   Basophils Absolute 61 0 - 200 cells/uL   Neutrophils Relative % 54 %   Lymphocytes Relative 28 %   Monocytes Relative 12 %   Eosinophils Relative 5 %   Basophils Relative 1 %   Smear Review Criteria for review not met   Comprehensive metabolic panel  Result Value Ref Range   Sodium 140 135 - 146 mmol/L   Potassium 4.0 3.5 - 5.3 mmol/L   Chloride 102 98 - 110 mmol/L   CO2 30 20 - 31 mmol/L   Glucose, Bld 112 (H) 65 - 99 mg/dL   BUN 8 7 - 25 mg/dL   Creat 0.90 0.70 - 1.25 mg/dL   Total Bilirubin 0.4 0.2 - 1.2 mg/dL   Alkaline Phosphatase 59 40 - 115 U/L   AST 14 10 - 35 U/L   ALT 16 9 - 46 U/L   Total Protein 6.2 6.1 - 8.1 g/dL   Albumin 4.2 3.6 - 5.1 g/dL   Calcium 9.4 8.6 - 10.3 mg/dL  Lipid panel  Result Value Ref Range   Cholesterol 123 (L) 125 - 200 mg/dL   Triglycerides 126 <150 mg/dL   HDL 68 >=40 mg/dL   Total CHOL/HDL Ratio 1.8 <=5.0 Ratio   VLDL 25 <30 mg/dL   LDL Cholesterol 30 <130 mg/dL    Hemoglobin A1c  Result Value Ref Range   Hgb A1c MFr Bld 5.9 (H) <5.7 %   Mean Plasma Glucose 123 mg/dL       Assessment & Plan:   1. Essential hypertension   2. COPD, very severe ([place])   3. Hyperlipidemia   4. DEPRESSION/ANXIETY   5. SMOKER   6. Need for prophylactic vaccination against Streptococcus pneumoniae (pneumococcus)   7. Type 2 diabetes mellitus without complication, without long-term current use of insulin ([facility])    -controlled. -obtain labs. -refer for diabetic education. -highly highly recommend smoking cessation; congratulations to patient for using nicotrol inhaler. -contracted safety regarding depression; increase Sertraline to 1.5 tablets daily.   Orders Placed This  Encounter  Procedures  . Pneumococcal polysaccharide vaccine 23-valent greater than or equal to 2yo subcutaneous/IM  . CBC with Differential/Platelet  . Comprehensive metabolic panel    Order Specific Question:   Has the patient fasted?    Answer:   Yes  . Lipid panel    Order Specific Question:   Has the patient fasted?    Answer:   Yes  . Hemoglobin A1c  . Ambulatory referral to diabetic education    Referral Priority:   Routine    Referral Type:   Consultation    Referral Reason:   Specialty Services Required    Number of Visits Requested:   1   Meds ordered this encounter  Medications  . predniSONE (DELTASONE) 20 MG tablet    Sig: Two tablets daily x 5 days then one tablet daily x 5 days    Dispense:  15 tablet    Refill:  0    Return in about 3 months (around 11/23/2016) for recheck diabetes, hyperlipidemia.   I personally performed the services described in this documentation, which was scribed in my presence. The recorded information has been reviewed and considered.  Tyden Kann Elayne Guerin, M.D. [facility] [address] [place], [place]  [address] [phone] phone [phone] fax

## 2016-08-24 NOTE — Patient Instructions (Signed)
     IF you received an x-ray today, you will receive an invoice from [facility]. Please contact [facility] at [phone] with questions or concerns regarding your invoice.   IF you received labwork today, you will receive an invoice from [facility]. Please contact [facility] at [phone] with questions or concerns regarding your invoice.   Our billing staff will not be able to assist you with questions regarding bills from these companies.  You will be contacted with the lab results as soon as they are available. The fastest way to get your results is to activate your My Chart account. Instructions are located on the last page of this paperwork. If you have not heard from us regarding the results in 2 weeks, please contact this office.      

## 2016-08-25 LAB — HEMOGLOBIN A1C
HEMOGLOBIN A1C: 5.9 % — AB (ref <5.7)
Mean Plasma Glucose: 123 mg/dL

## 2016-09-17 ENCOUNTER — Ambulatory Visit (INDEPENDENT_AMBULATORY_CARE_PROVIDER_SITE_OTHER): Payer: BLUE CROSS/BLUE SHIELD | Admitting: Family Medicine

## 2016-09-17 VITALS — BP 116/78 | HR 90 | Temp 98.6°F | Resp 18 | Ht 70.0 in | Wt 153.2 lb

## 2016-09-17 DIAGNOSIS — J441 Chronic obstructive pulmonary disease with (acute) exacerbation: Secondary | ICD-10-CM

## 2016-09-17 MED ORDER — IPRATROPIUM BROMIDE 0.02 % IN SOLN
0.5000 mg | Freq: Once | RESPIRATORY_TRACT | Status: AC
  Administered 2016-09-17: 0.5 mg via RESPIRATORY_TRACT

## 2016-09-17 MED ORDER — LEVOFLOXACIN 500 MG PO TABS: 500.0000 mg | ORAL_TABLET | Freq: Every day | ORAL | 0 refills | Status: DC

## 2016-09-17 MED ORDER — ALBUTEROL SULFATE (2.5 MG/3ML) 0.083% IN NEBU
2.5000 mg | INHALATION_SOLUTION | Freq: Once | RESPIRATORY_TRACT | Status: AC
  Administered 2016-09-17: 2.5 mg via RESPIRATORY_TRACT

## 2016-09-17 MED ORDER — BENZONATATE 200 MG PO CAPS: 200.0000 mg | ORAL_CAPSULE | Freq: Three times a day (TID) | ORAL | 0 refills | Status: DC | PRN

## 2016-09-17 MED ORDER — PREDNISONE 20 MG PO TABS: ORAL_TABLET | ORAL | 0 refills | Status: DC

## 2016-09-17 MED ORDER — BLOOD GLUCOSE MONITOR KIT: PACK | 0 refills | Status: DC

## 2016-09-17 MED ORDER — METHYLPREDNISOLONE SODIUM SUCC 125 MG IJ SOLR
125.0000 mg | Freq: Once | INTRAMUSCULAR | Status: AC
  Administered 2016-09-17: 125 mg via INTRAMUSCULAR

## 2016-09-17 NOTE — Patient Instructions (Addendum)
IF you received an x-ray today, you will receive an invoice from [facility]. Please contact [facility] at [phone] with questions or concerns regarding your invoice.   IF you received labwork today, you will receive an invoice from [facility]. Please contact [facility] at [phone] with questions or concerns regarding your invoice.   Our billing staff will not be able to assist you with questions regarding bills from these companies.  You will be contacted with the lab results as soon as they are available. The fastest way to get your results is to activate your My Chart account. Instructions are located on the last page of this paperwork. If you have not heard from [place] regarding the results in 2 weeks, please contact this office.     Smoking Cessation, Tips for Success If you are ready to quit smoking, congratulations! You have chosen to help yourself be healthier. Cigarettes bring nicotine, tar, carbon monoxide, and other irritants into your body. Your lungs, heart, and blood vessels will be able to work better without these poisons. There are many different ways to quit smoking. Nicotine gum, nicotine patches, a nicotine inhaler, or nicotine nasal spray can help with physical craving. Hypnosis, support groups, and medicines help break the habit of smoking. WHAT THINGS CAN I DO TO MAKE QUITTING EASIER?  Here are some tips to help you quit for good:  Pick a date when you will quit smoking completely. Tell all of your friends and family about your plan to quit on that date.  Do not try to slowly cut down on the number of cigarettes you are smoking. Pick a quit date and quit smoking completely starting on that day.  Throw away all cigarettes.   Clean and remove all ashtrays from your home, work, and car.  On a card, write down your reasons for quitting. Carry the card with you and read it when you get the urge to smoke.  Cleanse your  body of nicotine. Drink enough water and fluids to keep your urine clear or pale yellow. Do this after quitting to flush the nicotine from your body.  Learn to predict your moods. Do not let a bad situation be your excuse to have a cigarette. Some situations in your life might tempt you into wanting a cigarette.  Never have "just one" cigarette. It leads to wanting another and another. Remind yourself of your decision to quit.  Change habits associated with smoking. If you smoked while driving or when feeling stressed, try other activities to replace smoking. Stand up when drinking your coffee. Brush your teeth after eating. Sit in a different chair when you read the paper. Avoid alcohol while trying to quit, and try to drink fewer caffeinated beverages. Alcohol and caffeine may urge you to smoke.  Avoid foods and drinks that can trigger a desire to smoke, such as sugary or spicy foods and alcohol.  Ask people who smoke not to smoke around you.  Have something planned to do right after eating or having a cup of coffee. For example, plan to take a walk or exercise.  Try a relaxation exercise to calm you down and decrease your stress. Remember, you may be tense and nervous for the first 2 weeks after you quit, but this will pass.  Find new activities to keep your hands busy. Play with a pen, coin, or rubber band. Doodle or draw things on paper.  Brush your teeth right after eating. This will  help cut down on the craving for the taste of tobacco after meals. You can also try mouthwash.   Use oral substitutes in place of cigarettes. Try using lemon drops, carrots, cinnamon sticks, or chewing gum. Keep them handy so they are available when you have the urge to smoke.  When you have the urge to smoke, try deep breathing.  Designate your home as a nonsmoking area.  If you are a heavy smoker, ask your health care provider about a prescription for nicotine chewing gum. It can ease your withdrawal  from nicotine.  Reward yourself. Set aside the cigarette money you save and buy yourself something nice.  Look for support from others. Join a support group or smoking cessation program. Ask someone at home or at work to help you with your plan to quit smoking.  Always ask yourself, "Do I need this cigarette or is this just a reflex?" Tell yourself, "Today, I choose not to smoke," or "I do not want to smoke." You are reminding yourself of your decision to quit.  Do not replace cigarette smoking with electronic cigarettes (commonly called e-cigarettes). The safety of e-cigarettes is unknown, and some may contain harmful chemicals.  If you relapse, do not give up! Plan ahead and think about what you will do the next time you get the urge to smoke. HOW WILL I FEEL WHEN I QUIT SMOKING? You may have symptoms of withdrawal because your body is used to nicotine (the addictive substance in cigarettes). You may crave cigarettes, be irritable, feel very hungry, cough often, get headaches, or have difficulty concentrating. The withdrawal symptoms are only temporary. They are strongest when you first quit but will go away within 10-14 days. When withdrawal symptoms occur, stay in control. Think about your reasons for quitting. Remind yourself that these are signs that your body is healing and getting used to being without cigarettes. Remember that withdrawal symptoms are easier to treat than the major diseases that smoking can cause.  Even after the withdrawal is over, expect periodic urges to smoke. However, these cravings are generally short lived and will go away whether you smoke or not. Do not smoke! WHAT RESOURCES ARE AVAILABLE TO HELP ME QUIT SMOKING? Your health care provider can direct you to community resources or hospitals for support, which may include:  Group support.  Education.  Hypnosis.  Therapy.   This information is not intended to replace advice given to you by your health care  provider. Make sure you discuss any questions you have with your health care provider.   Document Released: 09/03/2004 Document Revised: 12/27/2014 Document Reviewed: 05/24/2013 Elsevier Interactive Patient Education [employer].

## 2016-09-17 NOTE — Progress Notes (Signed)
By signing my name below I, Tereasa Coop, attest that this documentation has been prepared under the direction and in the presence of Delman Cheadle, MD. Electonically Signed. Tereasa Coop, Scribe 09/17/2016 at 12:12 PM   Subjective:    Patient ID: Leslie Sims, male    DOB: Sep 23, 1951, 65 y.o.   MRN: [id number]  Chief Complaint  Patient presents with  . Cough    chest congestion and shortness of breath    HPI Dickson Kostelnik is a 65 y.o. male who presents to the [facility] complaining of productive cough with yellow/green phlegm for the past week. Pt also reports mild SOB, chest congestion, and chest tightness. Pt has been using his nebulizer machine 5-6 times a day for the past week with mild relief. Pt also reports nasal congestion. Denies any blood in cough or chills. Pt states he has tolerated prednisone well in the past.   Pt was on doxycycline 6 weeks ago. For similar symptoms. Pt states symptoms this week are worse than 6 weeks ago.   Pt has history of COPD.  Patient Active Problem List   Diagnosis Date Noted  . Benign neoplasm of ascending colon   . Benign neoplasm of descending colon   . Benign neoplasm of sigmoid colon   . Personal history of colonic polyps   . Pulmonary nodules 01/05/2015  . Bruit of left carotid artery 11/04/2014  . Glucose intolerance (impaired glucose tolerance) 06/18/2014  . Sleep disorder 07/18/2013  . Seasonal and perennial allergic rhinitis 05/29/2013  . Annual physical exam 02/18/2013  . COPD exacerbation ([place]) 01/02/2013  . Sinusitis 01/02/2013  . Cough 11/14/2012  . Leg pain 05/29/2012  . Hypertension   . Bradycardia 04/20/2011  . SMOKER 07/30/2010  . CAD, NATIVE VESSEL 04/23/2010  . COLONIC POLYPS 03/24/2010  . Hyperlipidemia 03/24/2010  . DEPRESSION/ANXIETY 03/24/2010  . Chronic systolic heart failure ([place]) 03/24/2010  . COPD, very severe ([place]) 03/24/2010  . GERD 03/24/2010  . Essential hypertension  11/21/2009  . Chest pain 11/21/2009    Current Outpatient Prescriptions on File Prior to Visit  Medication Sig Dispense Refill  . albuterol (PROVENTIL) (2.5 MG/3ML) 0.083% nebulizer solution Take 3 mLs (2.5 mg total) by nebulization every 6 (six) hours as needed for wheezing or shortness of breath. 360 vial 4  . aspirin 81 MG tablet Take 81 mg by mouth daily.      [name] atorvastatin (LIPITOR) 40 MG tablet Take 1 tablet (40 mg total) by mouth daily. 90 tablet 3  . benzonatate (TESSALON) 200 MG capsule Take 1 capsule (200 mg total) by mouth 3 (three) times daily as needed for cough. 20 capsule 0  . CORTIFOAM 10 % rectal foam PLACE ONE APPLICATION RECTALLY TWICE DAILY 15 g 5  . fluticasone (FLONASE) 50 MCG/ACT nasal spray Place 1 spray into both nostrils 2 (two) times daily. 16 g 0  . Fluticasone-Salmeterol (ADVAIR DISKUS) 500-50 MCG/DOSE AEPB Inhale 1 puff into the lungs 2 (two) times daily. 180 each 3  . LORazepam (ATIVAN) 0.5 MG tablet Take 1 tablet (0.5 mg total) by mouth daily as needed for anxiety. 30 tablet 3  . losartan (COZAAR) 25 MG tablet Take 1 tablet (25 mg total) by mouth daily. 90 tablet 3  . metFORMIN (GLUCOPHAGE) 500 MG tablet TAKE 1 TABLET DAILY AFTER SUPPER 90 tablet 0  . Multiple Vitamin (MULTIVITAMIN) capsule Take 1 capsule by mouth daily.    . nicotine (NICODERM CQ - DOSED IN MG/24  HOURS) 21 mg/24hr patch Place 1 patch (21 mg total) onto the skin daily. 28 patch 12  . NICOTROL 10 MG inhaler INHALE CONTENTS OF 1 CARTRIDGE (CONTINUOUS PUFFING) INTO THE LUNGS AS NEEDED FOR SMOKING CESSATION ( 168 each 4  . nitroGLYCERIN (NITROSTAT) 0.4 MG SL tablet Place 1 tablet (0.4 mg total) under the tongue every 5 (five) minutes as needed. 25 tablet 3  . pantoprazole (PROTONIX) 40 MG tablet Take 1 tablet (40 mg total) by mouth 2 (two) times daily. 180 tablet 3  . predniSONE (DELTASONE) 20 MG tablet Two tablets daily x 5 days then one tablet daily x 5 days 15 tablet 0  . sertraline (ZOLOFT) 100  MG tablet Take 1-2 tablets (100-200 mg total) by mouth daily. 180 tablet 1  . sucralfate (CARAFATE) 1 GM/10ML suspension TAKE 10ML( 2 TEASPOONSFUL) BY MOUTH FOUR TIMES DAILY 3600 mL 1  . tiotropium (SPIRIVA HANDIHALER) 18 MCG inhalation capsule Place 1 capsule (18 mcg total) into inhaler and inhale daily. 90 capsule 3  . VENTOLIN HFA 108 (90 Base) MCG/ACT inhaler USE 2 INHALATIONS INTO  THE LUNGS  EVERY 6 HOURS AS NEEDED FOR WHEEZING 54 g 1  . sodium chloride (OCEAN) 0.65 % SOLN nasal spray Place 2 sprays into both nostrils every 2 (two) hours while awake.  0   No current facility-administered medications on file prior to visit.     Allergies  Allergen Reactions  . Wellbutrin [Bupropion] Other (See Comments)    Unknown/"made me feel real funny"    Depression screen [facility] 2/9 09/17/2016 08/24/2016 07/27/2016 06/15/2016 03/02/2016  Decreased Interest 0 0 0 0 0  Down, Depressed, Hopeless 0 0 1 0 0  PHQ - 2 Score 0 0 1 0 0  Altered sleeping - - - - -  Tired, decreased energy - - - - -  Change in appetite - - - - -  Feeling bad or failure about yourself  - - - - -  Trouble concentrating - - - - -  Moving slowly or fidgety/restless - - - - -  Suicidal thoughts - - - - -  PHQ-9 Score - - - - -       Review of Systems  Constitutional: Negative for chills.  HENT: Positive for congestion.   Eyes: Negative.   Respiratory: Positive for cough, chest tightness and shortness of breath.   Cardiovascular: Negative for chest pain, palpitations and leg swelling.  Gastrointestinal: Negative.   Genitourinary: Negative.   Musculoskeletal: Negative.   Skin: Negative.   Neurological: Negative.   Psychiatric/Behavioral: Negative.        Objective:   Physical Exam  Constitutional: He is oriented to person, place, and time. He appears well-developed and well-nourished. No distress.  HENT:  Head: Normocephalic and atraumatic.  Eyes: Conjunctivae are normal. Pupils are equal, round, and reactive to  light.  Neck: Neck supple.  Cardiovascular: Normal rate, regular rhythm and normal heart sounds.  Exam reveals no gallop and no friction rub.   No murmur heard. Pulmonary/Chest: Accessory muscle usage present. He has decreased breath sounds (throughout).  Musculoskeletal: Normal range of motion.  Neurological: He is alert and oriented to person, place, and time.  Skin: Skin is warm and dry.  Psychiatric: He has a normal mood and affect. His behavior is normal.  Nursing note and vitals reviewed. BP 116/78 (BP Location: Left Arm, Patient Position: Sitting, Cuff Size: Normal)   Pulse 90   Temp 98.6 F (37 C) (Oral)  Resp 18   Ht _0  (1.778 m)   Wt 153 lb 3.2 oz (69.5 kg)   SpO2 97%   BMI 21.98 kg/m    Pt has much improved, good air movement after duoneb and solumedrol treatment. Upon auscultation there is good air movement, no wheezes, rales, or rhonchi.       Assessment & Plan:   1. COPD exacerbation ([place])     Meds ordered this encounter  Medications  . albuterol (PROVENTIL) (2.5 MG/3ML) 0.083% nebulizer solution 2.5 mg  . ipratropium (ATROVENT) nebulizer solution 0.5 mg  . methylPREDNISolone sodium succinate (SOLU-MEDROL) 125 mg/2 mL injection 125 mg  . blood glucose meter kit and supplies KIT    Sig: Dispense based on patient and insurance preference. Use up to four times daily as directed. (FOR ICD-9 250.00, 250.01).    Dispense:  1 each    Refill:  0    Order Specific Question:   Number of strips    Answer:   1000    Order Specific Question:   Number of lancets    Answer:   1000  . levofloxacin (LEVAQUIN) 500 MG tablet    Sig: Take 1 tablet (500 mg total) by mouth daily.    Dispense:  7 tablet    Refill:  0  . predniSONE (DELTASONE) 20 MG tablet    Sig: Two tablets daily x 5 days then one tablet daily x 5 days    Dispense:  15 tablet    Refill:  0  . benzonatate (TESSALON) 200 MG capsule    Sig: Take 1 capsule (200 mg total) by mouth 3 (three) times daily as  needed for cough.    Dispense:  60 capsule    Refill:  0    I personally performed the services described in this documentation, which was scribed in my presence. The recorded information has been reviewed and considered, and addended by me as needed.   Delman Cheadle, M.D.  [facility] [address] [place], [place] [address] [phone] phone [phone] fax  09/20/16 1:08 PM

## 2016-09-21 ENCOUNTER — Encounter: Payer: Self-pay | Admitting: Pulmonary Disease

## 2016-09-21 ENCOUNTER — Ambulatory Visit (INDEPENDENT_AMBULATORY_CARE_PROVIDER_SITE_OTHER): Payer: BLUE CROSS/BLUE SHIELD | Admitting: Pulmonary Disease

## 2016-09-21 VITALS — BP 112/60 | HR 87 | Ht 70.0 in | Wt 153.0 lb

## 2016-09-21 DIAGNOSIS — J449 Chronic obstructive pulmonary disease, unspecified: Secondary | ICD-10-CM | POA: Diagnosis not present

## 2016-09-21 DIAGNOSIS — F17200 Nicotine dependence, unspecified, uncomplicated: Secondary | ICD-10-CM

## 2016-09-21 NOTE — Patient Instructions (Signed)
Smoking cessation  Follow up in 3 months

## 2016-09-24 NOTE — Progress Notes (Signed)
PULMONARY OFFICE FOLLOW UP NOTE  Requesting MD/Service: [name], MD Date of initial consultation: 08/10/16 Reason for consultation: COPD, smoker  PT PROFILE: 80 M smoker with severe emphysema seen by multiple pulmonologists in past ([place], [place], [place], [place]) referred for further eval and mgmt of COPD   DATA: 07/24/12 Office Spirometry: Severe obstruction (FEV1 31% pred) 08/31/13 Office Spirometry: very severe obstruction (FEV1 25% pred) 06/18/15 CT chest: Severe emphysema 07/27/16 CXR: Severe hyperinflation and hyperlucency   SUBJ: Routine re-eval. No major changes. No new complaints. Has not really been using NRT consistently. Still smoking almost a pack per day. Denies CP, fever, purulent sputum, hemoptysis, LE edema and calf tenderness. Continues to have class II dyspnea.  OBJ: Vitals:   09/21/16 1116  BP: 112/60  Pulse: 87  SpO2: 96%  Weight: 153 lb (69.4 kg)  Height: 5\' 10"  (1.778 m)     EXAM:  Gen: WDWN, Appears much older than his true age, No overt respiratory distress HEENT: NCAT, sclera white, oropharynx normal Neck: Supple without LAN, thyromegaly, JVD Lungs: breath sounds moderately diminished, no wheezes Cardiovascular: RRR, no murmurs noted Abdomen: Soft, nontender, normal BS Ext: without clubbing, cyanosis, edema Neuro: CNs grossly intact, motor and sensory intact  DATA:   BMP Latest Ref Rng & Units 08/24/2016 06/15/2016 03/02/2016  Glucose 65 - 99 mg/dL 112(H) 78 96  BUN 7 - 25 mg/dL 8 13 15   Creatinine 0.70 - 1.25 mg/dL 0.90 0.79 0.86  BUN/Creat Ratio 10 - 22 - - -  Sodium 135 - 146 mmol/L 140 139 142  Potassium 3.5 - 5.3 mmol/L 4.0 4.5 4.1  Chloride 98 - 110 mmol/L 102 101 99  CO2 20 - 31 mmol/L 30 27 33(H)  Calcium 8.6 - 10.3 mg/dL 9.4 10.0 9.6    CBC Latest Ref Rng & Units 08/24/2016 07/27/2016 06/15/2016  WBC 3.8 - 10.8 K/uL 6.1 8.2 7.3  Hemoglobin 13.2 - 17.1 g/dL 14.4 14.2 14.8  Hematocrit 38.5 - 50.0 % 41.8 41.3(A) 43.4  Platelets  140 - 400 K/uL 232 - 247    CXR:  NNF  IMPRESSION:     ICD-9-CM ICD-10-CM   1. Stage 4 very severe COPD by GOLD classification ([place]) 496 J44.9   2. Smoker 305.1 F17.200      PLAN:  1) Iagain  counseled re: smoking cessation and strategies and again recommended NRT patches and Nicotrol inhaler as prescribed by Dr Tamala Julian.  2) Continue Advair and Spiriva as well as PRN albuterol 3) ROV in 3 months   Merton Border, MD PCCM service Mobile [phone] Pager [phone] 09/24/2016

## 2016-10-01 ENCOUNTER — Other Ambulatory Visit: Payer: Self-pay | Admitting: Family Medicine

## 2016-10-05 ENCOUNTER — Other Ambulatory Visit: Payer: Self-pay | Admitting: Family Medicine

## 2016-10-08 NOTE — Telephone Encounter (Signed)
Called in RF 

## 2016-10-13 ENCOUNTER — Encounter: Payer: Self-pay | Admitting: Family Medicine

## 2016-10-13 ENCOUNTER — Ambulatory Visit (INDEPENDENT_AMBULATORY_CARE_PROVIDER_SITE_OTHER): Payer: BLUE CROSS/BLUE SHIELD

## 2016-10-13 ENCOUNTER — Ambulatory Visit (INDEPENDENT_AMBULATORY_CARE_PROVIDER_SITE_OTHER): Payer: BLUE CROSS/BLUE SHIELD | Admitting: Family Medicine

## 2016-10-13 VITALS — BP 126/70 | HR 101 | Temp 97.5°F | Resp 16 | Ht 68.25 in | Wt 147.6 lb

## 2016-10-13 DIAGNOSIS — I2510 Atherosclerotic heart disease of native coronary artery without angina pectoris: Secondary | ICD-10-CM | POA: Diagnosis not present

## 2016-10-13 DIAGNOSIS — F341 Dysthymic disorder: Secondary | ICD-10-CM

## 2016-10-13 DIAGNOSIS — J441 Chronic obstructive pulmonary disease with (acute) exacerbation: Secondary | ICD-10-CM | POA: Diagnosis not present

## 2016-10-13 DIAGNOSIS — E119 Type 2 diabetes mellitus without complications: Secondary | ICD-10-CM

## 2016-10-13 DIAGNOSIS — R0602 Shortness of breath: Secondary | ICD-10-CM | POA: Diagnosis not present

## 2016-10-13 DIAGNOSIS — F17200 Nicotine dependence, unspecified, uncomplicated: Secondary | ICD-10-CM | POA: Diagnosis not present

## 2016-10-13 DIAGNOSIS — J3089 Other allergic rhinitis: Secondary | ICD-10-CM | POA: Diagnosis not present

## 2016-10-13 DIAGNOSIS — J302 Other seasonal allergic rhinitis: Secondary | ICD-10-CM | POA: Diagnosis not present

## 2016-10-13 LAB — CBC WITH DIFFERENTIAL/PLATELET
BASOS ABS: 0 {cells}/uL (ref 0–200)
Basophils Relative: 0 %
EOS PCT: 1 %
Eosinophils Absolute: 64 cells/uL (ref 15–500)
HCT: 46.7 % (ref 38.5–50.0)
HEMOGLOBIN: 16.1 g/dL (ref 13.2–17.1)
LYMPHS PCT: 26 %
Lymphs Abs: 1664 cells/uL (ref 850–3900)
MCH: 30.8 pg (ref 27.0–33.0)
MCHC: 34.5 g/dL (ref 32.0–36.0)
MCV: 89.5 fL (ref 80.0–100.0)
MONOS PCT: 10 %
MPV: 9.5 fL (ref 7.5–12.5)
Monocytes Absolute: 640 cells/uL (ref 200–950)
NEUTROS PCT: 63 %
Neutro Abs: 4032 cells/uL (ref 1500–7800)
PLATELETS: 217 10*3/uL (ref 140–400)
RBC: 5.22 MIL/uL (ref 4.20–5.80)
RDW: 13.4 % (ref 11.0–15.0)
WBC: 6.4 10*3/uL (ref 3.8–10.8)

## 2016-10-13 LAB — COMPREHENSIVE METABOLIC PANEL
ALT: 18 U/L (ref 9–46)
AST: 16 U/L (ref 10–35)
Albumin: 4.7 g/dL (ref 3.6–5.1)
Alkaline Phosphatase: 65 U/L (ref 40–115)
BUN: 8 mg/dL (ref 7–25)
CHLORIDE: 100 mmol/L (ref 98–110)
CO2: 32 mmol/L — ABNORMAL HIGH (ref 20–31)
Calcium: 10 mg/dL (ref 8.6–10.3)
Creat: 0.92 mg/dL (ref 0.70–1.25)
Glucose, Bld: 60 mg/dL — ABNORMAL LOW (ref 65–99)
POTASSIUM: 4.8 mmol/L (ref 3.5–5.3)
Sodium: 142 mmol/L (ref 135–146)
TOTAL PROTEIN: 6.8 g/dL (ref 6.1–8.1)
Total Bilirubin: 0.5 mg/dL (ref 0.2–1.2)

## 2016-10-13 IMAGING — DX DG CHEST 2V
2 series · 2 of 2 positions shown · non-contrast
Comparison: [DATE]; [DATE]; [DATE]; chest CT -
[DATE]

CLINICAL DATA: Shortness of breath for the past 10 days.

EXAM:
CHEST  2 VIEW

[chest pa]
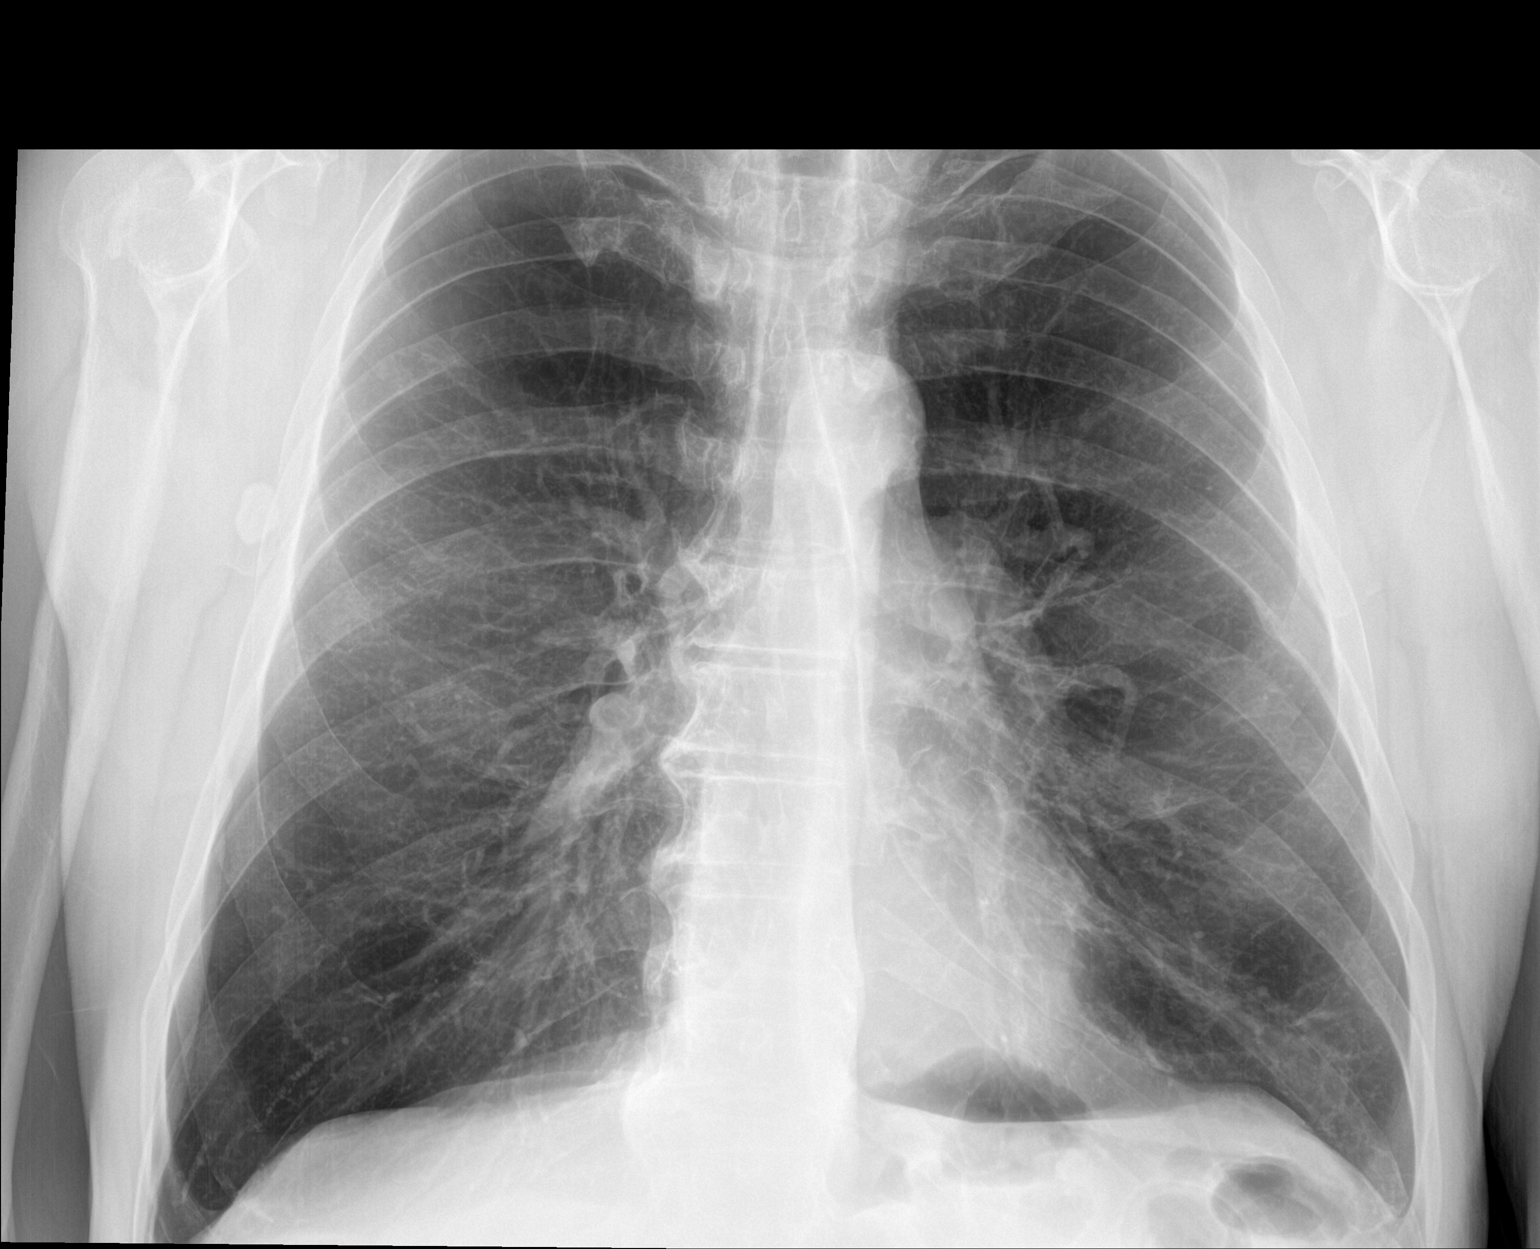

[chest lat]
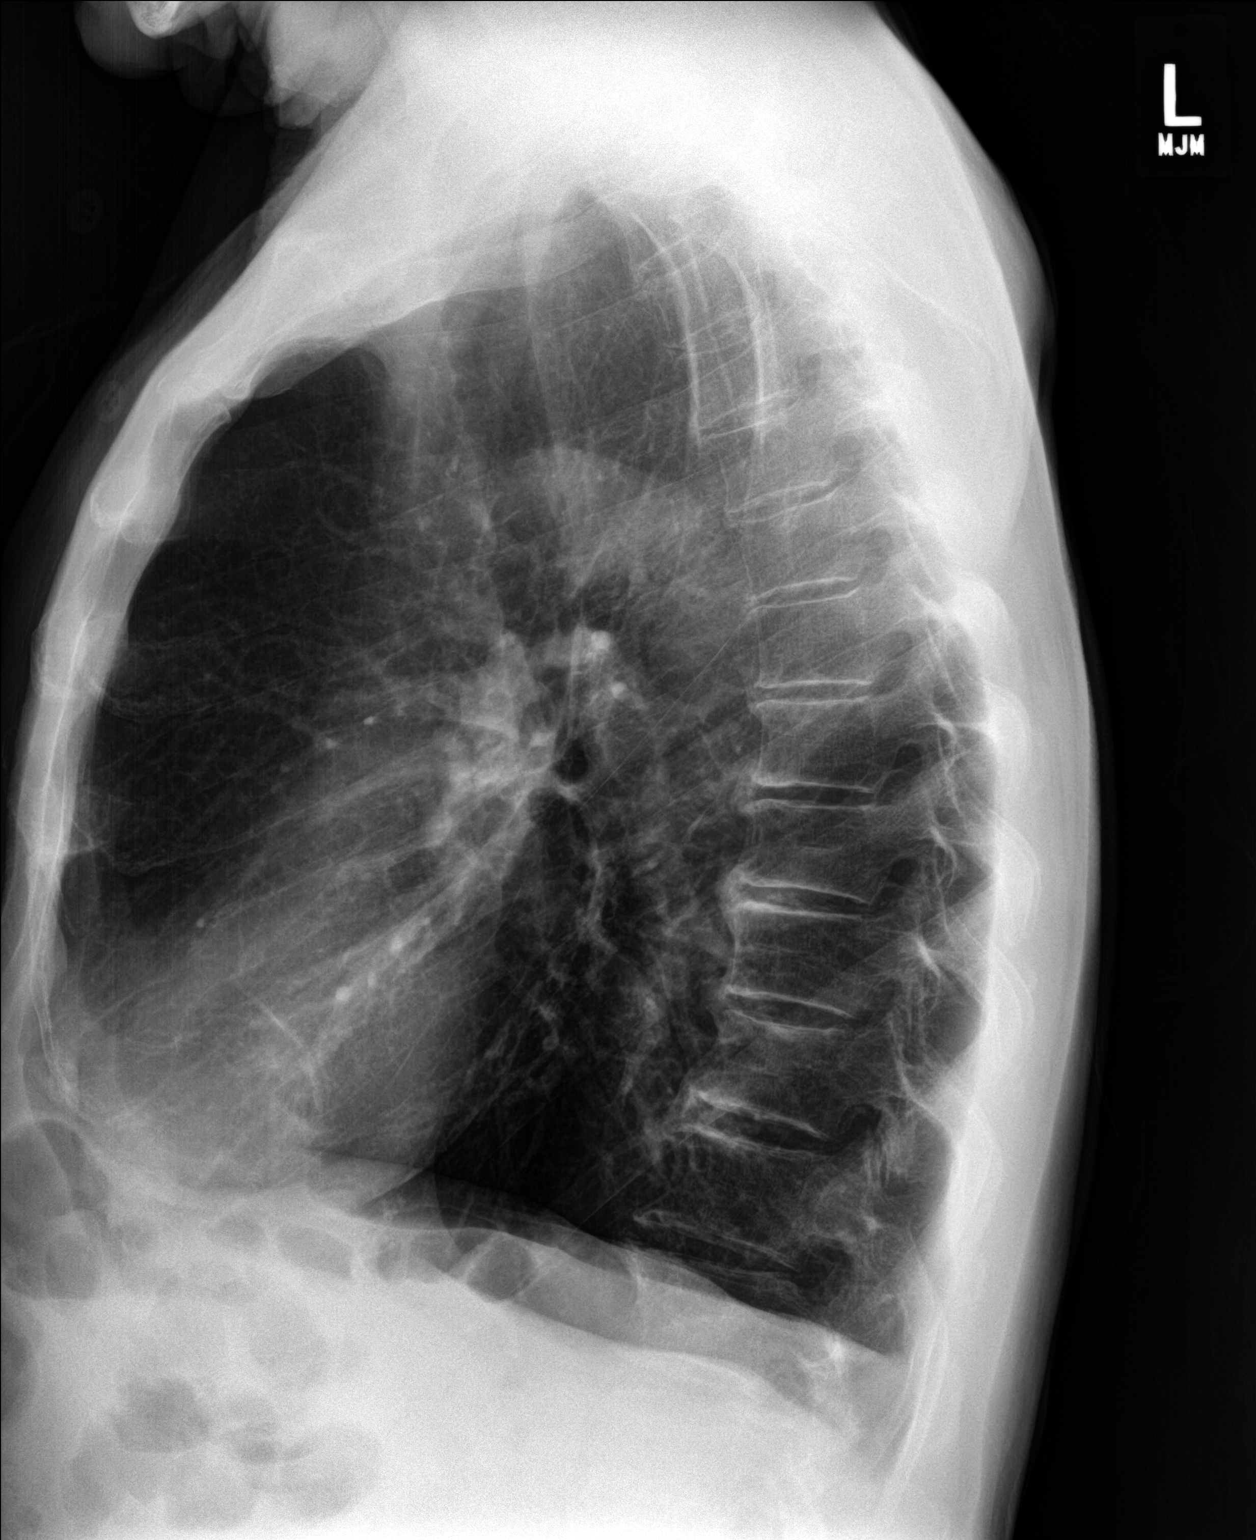

[2 of 2 positions shown; findings below may reference images not displayed]

FINDINGS: Grossly unchanged cardiac silhouette and mediastinal contours. The
lungs are hyperexpanded with flattening of the diaphragms and
blunting of bilateral costophrenic angles. No pleural effusion or
pneumothorax. No evidence of edema. No acute osseus abnormalities.
IMPRESSION: 1. Marked lung hyper expansion without acute cardiopulmonary
disease.
2.  Emphysema. ([N6]-[N6])

## 2016-10-13 MED ORDER — METFORMIN HCL 500 MG PO TABS: ORAL_TABLET | ORAL | 3 refills | Status: DC

## 2016-10-13 MED ORDER — DOXYCYCLINE HYCLATE 100 MG PO CAPS: 100.0000 mg | ORAL_CAPSULE | Freq: Two times a day (BID) | ORAL | 0 refills | Status: DC

## 2016-10-13 MED ORDER — PREDNISONE 20 MG PO TABS: ORAL_TABLET | ORAL | 0 refills | Status: DC

## 2016-10-13 MED ORDER — ATORVASTATIN CALCIUM 40 MG PO TABS: 40.0000 mg | ORAL_TABLET | Freq: Every day | ORAL | 3 refills | Status: DC

## 2016-10-13 NOTE — Progress Notes (Signed)
Subjective:    Patient ID: Leslie Sims, male    DOB: 11/11/1951, 65 y.o.   MRN: [id number]  10/13/2016  SOB (x 1 1/2 weeks, depression score 16 during triage)   HPI This 65 y.o. male presents for evaluation of shortness of breath for ten days.  Has been really SOB for the past 10 days; putting it all; a lot of phlegm; gooky light yellow and thick thickphlegm; burning in chest upper.  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  A little SOB prior to cutting grass; must have triggered it.  Trying to work some to get mucous up.  Not sitting down. Must take a lot more breaks than usual.  Must rest every 30 minutes.  No fever/chills/sweats.  Mild headache.  No ear pain or sore throat.  +rhinorrhea white and thick.  +nasal congestion chronic.  +PND.  Minimal wheezing; has not noticed wheezing.  No swelling; still sleeping in recliner.  Using Advair and Spiriva.  Nebulizer 5-6 times per day; rescue HFA 2-3 times per day.  No other medications. No allergy tablet. Prescribed nicotine patches and nicotrol.  Down to 3/4 pack of cigarettes.  Smoking for 45 years.  Called and asked about vapor cigarettes did not recommend vapor cigarettes due to lack of knowing what is in it.   Constant chest discomfort right now.  Does not feel like wheezing.  Scared to death to come up to office.  [place] felt better on 70; felt safer.  No lightheaded, clammy, or dizzy.  Only taking one Sertraline every morning.  Anxiety; worrying about health. Due for Jackie's follow-up.  Death in family; had to cancel appointments.    Depression screen [facility] 2/9 10/13/2016 09/17/2016 08/24/2016 07/27/2016 06/15/2016  Decreased Interest 2 0 0 0 0  Down, Depressed, Hopeless 2 0 0 1 0  PHQ - 2 Score 4 0 0 1 0  Altered sleeping 3 - - - -  Tired, decreased energy 3 - - - -  Change in appetite 0 - - - -  Feeling bad or failure about yourself  2 - - - -  Trouble concentrating 2 - - - -  Moving  slowly or fidgety/restless 2 - - - -  Suicidal thoughts 0 - - - -  PHQ-9 Score 16 - - - -  Difficult doing work/chores Somewhat difficult - - - -    Review of Systems  Constitutional: Negative for activity change, appetite change, chills, diaphoresis, fatigue and fever.  HENT: Positive for congestion and rhinorrhea.   Eyes: Negative for visual disturbance.  Respiratory: Positive for shortness of breath. Negative for cough.   Cardiovascular: Negative for chest pain, palpitations and leg swelling.  Endocrine: Negative for cold intolerance, heat intolerance, polydipsia, polyphagia and polyuria.  Neurological: Positive for headaches. Negative for dizziness, tremors, seizures, syncope, facial asymmetry, speech difficulty, weakness, light-headedness and numbness.    Past Medical History:  Diagnosis Date  . Allergic rhinitis   . Anxiety   . Atherosclerosis   . Back pain   . Bronchitis    09-14-11  . CARDIOMYOPATHY 03/24/2010   did not tolerate Toprol and Lisinopril.   . Chest pain   . Chronic airway obstruction, not elsewhere classified   . Colon polyps 08/2012   colonoscopy; multiple colon polyps; repeat colonoscopy in one year.  [name] COPD (chronic obstructive pulmonary disease) ([place])   . Coronary artery disease 11/2009   Anterior MI with  late presentation 11/2009. Cath showed a 100% proximal LAD occlusion, 99% mid RCA. Had a PCI and 2 DES to both LAD and RCA. EF was 35%. Nuclear stress test 05/13: prior anterior/inferior infarcts without ischemic, EF 43%, cardiac cath in 02/14: Widely patent stents with no other obstructive disease. EF 40%   . Depression   . Diabetes mellitus without complication ([place])   . GERD (gastroesophageal reflux disease)   . Headache(784.0)   . Helicobacter pylori (H. pylori)   . Hemorrhoids   . Hernia    inguinal  . Hyperlipidemia   . Hypertension   . Insomnia   . MI (myocardial infarction)   . Myalgia   . Peptic ulcer   . Shortness of breath dyspnea   .  Status post dilation of esophageal narrowing 2000  . Thyroid dysfunction   . Tinea pedis   . Wears dentures    partial upper   Past Surgical History:  Procedure Laterality Date  . Admission  12/20/2012   COPD exacerbation.  [place].  [name] CARDIAC CATHETERIZATION    . CARDIAC CATHETERIZATION  02/05/13   [facility]  . CARDIAC CATHETERIZATION  10/14   [facility] : patent stents with no change in anatomy. EF: 40$  . COLONOSCOPY    . COLONOSCOPY WITH PROPOFOL N/A 05/18/2016   Procedure: COLONOSCOPY WITH PROPOFOL;  Surgeon: Lucilla Lame, MD;  Location: [facility] ENDOSCOPY;  Service: Endoscopy;  Laterality: N/A;  . CORONARY ANGIOPLASTY WITH STENT PLACEMENT  09/2009   LAD 3.0 X23 mm Xience DES, RCA: 4.0 X 15 mm Xience DES  . ESOPHAGEAL DILATION    . ESOPHAGOGASTRODUODENOSCOPY  2008  . ESOPHAGOGASTRODUODENOSCOPY  08/20/2012  . HERNIA REPAIR  07/20/2012   L inguinal hernia repair  . PENILE PROSTHESIS IMPLANT     Allergies  Allergen Reactions  . Wellbutrin [Bupropion] Other (See Comments)    Unknown/"made me feel real funny"    Social History   Social History  . Marital status: Married    Spouse name: N/A  . Number of children: 5  . Years of education: N/A   Occupational History  . 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 Smoking status: Current Every Day Smoker    Packs/day: 0.00    Years: 42.00    Types: Cigarettes  . Smokeless tobacco: Never Used     Comment: smoking 15-18 cigarettes daily  . Alcohol use No  . Drug use: No  . Sexual activity: Yes   Other Topics Concern  . Not on file   Social History Narrative   Marital status: married x 32 years; happily married.      Children: 4 children;  1 stepchild and 15 grandchildren; no great grandchildren.      Lives: with wife; 2 dogs, 1 cat.      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      Tobacco: 1 ppd x 45 years      Alcohol: none; previous alcoholism      Drugs: none      Exercise: no  formal exercise; physically demanding job; owns horses.      Seatbelts:  100%      Guns: one loaded unsecured gun in the home.      Sunscreen: none   Family History  Problem Relation Age of Onset  . Heart attack Brother     Brother #1  . Diabetes Brother     brother #2  . Hypertension Brother     #  3  . Coronary artery disease Father 50    deceased  . Heart attack Father   . Diabetes Father   . Heart disease Father   . COPD Mother 63    deceased  . Heart attack Sister 23    acute-cocaine abuse/deceased  . Alcohol abuse Sister     polysubstance abuse  . COPD Sister   . Alcohol abuse Sister     polysubstance abuse  . Heart disease Brother     AMI x multiple       Objective:    BP 126/70   Pulse (!) 101   Temp 97.5 F (36.4 C) (Oral)   Resp 16   Ht 5' 8.25" (1.734 m)   Wt 147 lb 9.6 oz (67 kg)   SpO2 95%   BMI 22.28 kg/m  Physical Exam  Constitutional: He is oriented to person, place, and time. He appears well-developed and well-nourished. No distress.  HENT:  Head: Normocephalic and atraumatic.  Right Ear: External ear normal.  Left Ear: External ear normal.  Nose: Nose normal.  Mouth/Throat: Oropharynx is clear and moist.  Eyes: Conjunctivae and EOM are normal. Pupils are equal, round, and reactive to light.  Neck: Normal range of motion. Neck supple. Carotid bruit is not present. No thyromegaly present.  Cardiovascular: Normal rate, regular rhythm, normal heart sounds and intact distal pulses.  Exam reveals no gallop and no friction rub.   No murmur heard. Pulmonary/Chest: Effort normal and breath sounds normal. He has no wheezes. He has no rales.  Good air movement.  No active wheezing; no tachypnea.  Abdominal: Soft. Bowel sounds are normal. He exhibits no distension and no mass. There is no tenderness. There is no rebound and no guarding.  Lymphadenopathy:    He has no cervical adenopathy.  Neurological: He is alert and oriented to person, place, and  time. No cranial nerve deficit.  Skin: Skin is warm and dry. No rash noted. He is not diaphoretic.  Psychiatric: He has a normal mood and affect. His behavior is normal.  Nursing note and vitals reviewed.       Assessment & Plan:   1. Shortness of breath   2. COPD exacerbation ([place])   3. Seasonal and perennial allergic rhinitis   4. Atherosclerosis of native coronary artery of native heart without angina pectoris   5. DEPRESSION/ANXIETY   6. SMOKER   7. Type 2 diabetes mellitus without complication, without long-term current use of insulin ([place])    -New. -CXR negative without infiltrate. -EKG stable/unchanged. -obtain labs. -treat for COPD exacerbation with Prednisone and Doxycycline. -monitor sugars closely while maintained on Prednisone. -continue chronic COPD medications. -to ED for acute decline. -encourage smoking cessation.  Orders Placed This Encounter  Procedures  . DG Chest 2 View    Standing Status:   Future    Number of Occurrences:   1    Standing Expiration Date:   10/13/2017    Order Specific Question:   Reason for Exam (SYMPTOM  OR DIAGNOSIS REQUIRED)    Answer:   SOB for ten days; chest soreness, burning, change in sputum color    Order Specific Question:   Preferred imaging location?    Answer:   External  . CBC with Differential/Platelet  . Comprehensive metabolic panel  . EKG 12-Lead   Meds ordered this encounter  Medications  . doxycycline (VIBRAMYCIN) 100 MG capsule    Sig: Take 1 capsule (100 mg total) by mouth 2 (two) times  daily.    Dispense:  20 capsule    Refill:  0  . predniSONE (DELTASONE) 20 MG tablet    Sig: Three tablets daily x 2 days, then two tablets daily x 5 days then one tablet daily x 5 days    Dispense:  21 tablet    Refill:  0  . atorvastatin (LIPITOR) 40 MG tablet    Sig: Take 1 tablet (40 mg total) by mouth daily.    Dispense:  90 tablet    Refill:  3  . metFORMIN (GLUCOPHAGE) 500 MG tablet    Sig: TAKE 1 TABLET DAILY  AFTER SUPPER    Dispense:  90 tablet    Refill:  3    No Follow-up on file.   Kristi Elayne Guerin, M.D. [facility] [address] [place], [place]  [address] [phone] phone [phone] fax

## 2016-10-13 NOTE — Patient Instructions (Addendum)
  PURCHASE MUCINEX AND START TAKING.   IF you received an x-ray today, you will receive an invoice from [facility]. Please contact [facility] at [phone] with questions or concerns regarding your invoice.   IF you received labwork today, you will receive an invoice from [employer]. Please contact [facility] at [phone] with questions or concerns regarding your invoice.   Our billing staff will not be able to assist you with questions regarding bills from these companies.  You will be contacted with the lab results as soon as they are available. The fastest way to get your results is to activate your My Chart account. Instructions are located on the last page of this paperwork. If you have not heard from [place] regarding the results in 2 weeks, please contact this office.

## 2016-10-18 ENCOUNTER — Telehealth: Payer: Self-pay | Admitting: Radiology

## 2016-10-18 NOTE — Telephone Encounter (Signed)
Leslie Sims called in to ask about his x-ray results from 10/16/16. Please review.

## 2016-10-19 ENCOUNTER — Encounter: Payer: Self-pay | Admitting: Emergency Medicine

## 2016-10-19 ENCOUNTER — Emergency Department
Admission: EM | Admit: 2016-10-19 | Discharge: 2016-10-19 | Disposition: A | Discharge status: Home / Self Care | Payer: BLUE CROSS/BLUE SHIELD | Attending: Emergency Medicine | Admitting: Emergency Medicine

## 2016-10-19 ENCOUNTER — Emergency Department: Payer: BLUE CROSS/BLUE SHIELD

## 2016-10-19 DIAGNOSIS — Z85038 Personal history of other malignant neoplasm of large intestine: Secondary | ICD-10-CM | POA: Diagnosis not present

## 2016-10-19 DIAGNOSIS — Z7982 Long term (current) use of aspirin: Secondary | ICD-10-CM | POA: Insufficient documentation

## 2016-10-19 DIAGNOSIS — I110 Hypertensive heart disease with heart failure: Secondary | ICD-10-CM | POA: Insufficient documentation

## 2016-10-19 DIAGNOSIS — Z79899 Other long term (current) drug therapy: Secondary | ICD-10-CM | POA: Insufficient documentation

## 2016-10-19 DIAGNOSIS — I5022 Chronic systolic (congestive) heart failure: Secondary | ICD-10-CM | POA: Insufficient documentation

## 2016-10-19 DIAGNOSIS — J441 Chronic obstructive pulmonary disease with (acute) exacerbation: Secondary | ICD-10-CM

## 2016-10-19 DIAGNOSIS — F17210 Nicotine dependence, cigarettes, uncomplicated: Secondary | ICD-10-CM | POA: Diagnosis not present

## 2016-10-19 DIAGNOSIS — I2510 Atherosclerotic heart disease of native coronary artery without angina pectoris: Secondary | ICD-10-CM | POA: Insufficient documentation

## 2016-10-19 DIAGNOSIS — E119 Type 2 diabetes mellitus without complications: Secondary | ICD-10-CM | POA: Diagnosis not present

## 2016-10-19 DIAGNOSIS — R0602 Shortness of breath: Secondary | ICD-10-CM | POA: Diagnosis present

## 2016-10-19 DIAGNOSIS — Z7984 Long term (current) use of oral hypoglycemic drugs: Secondary | ICD-10-CM | POA: Insufficient documentation

## 2016-10-19 DIAGNOSIS — Z9861 Coronary angioplasty status: Secondary | ICD-10-CM | POA: Insufficient documentation

## 2016-10-19 DIAGNOSIS — R0789 Other chest pain: Secondary | ICD-10-CM

## 2016-10-19 LAB — CBC
HEMATOCRIT: 44.9 % (ref 40.0–52.0)
HEMOGLOBIN: 15.2 g/dL (ref 13.0–18.0)
MCH: 30.3 pg (ref 26.0–34.0)
MCHC: 33.8 g/dL (ref 32.0–36.0)
MCV: 89.6 fL (ref 80.0–100.0)
Platelets: 259 10*3/uL (ref 150–440)
RBC: 5.01 MIL/uL (ref 4.40–5.90)
RDW: 13.4 % (ref 11.5–14.5)
WBC: 9 10*3/uL (ref 3.8–10.6)

## 2016-10-19 LAB — BASIC METABOLIC PANEL
ANION GAP: 9 (ref 5–15)
BUN: 11 mg/dL (ref 6–20)
CHLORIDE: 100 mmol/L — AB (ref 101–111)
CO2: 28 mmol/L (ref 22–32)
Calcium: 9.3 mg/dL (ref 8.9–10.3)
Creatinine, Ser: 0.82 mg/dL (ref 0.61–1.24)
GFR calc non Af Amer: >60 mL/min (ref >60)
Glucose, Bld: 150 mg/dL — ABNORMAL HIGH (ref 65–99)
POTASSIUM: 3.4 mmol/L — AB (ref 3.5–5.1)
SODIUM: 137 mmol/L (ref 135–145)

## 2016-10-19 LAB — TROPONIN I: Troponin I: <0.03 ng/mL (ref <0.03)

## 2016-10-19 IMAGING — CR DG CHEST 2V
1 series · 2 of 2 positions shown · non-contrast
Comparison: [DATE]

CLINICAL DATA: Shortness of breath and chest pain

EXAM:
CHEST  2 VIEW

[Series 1: dg chest 2 view · 0.14mm/px · 2 of 2 slices shown]
[im 1/2]
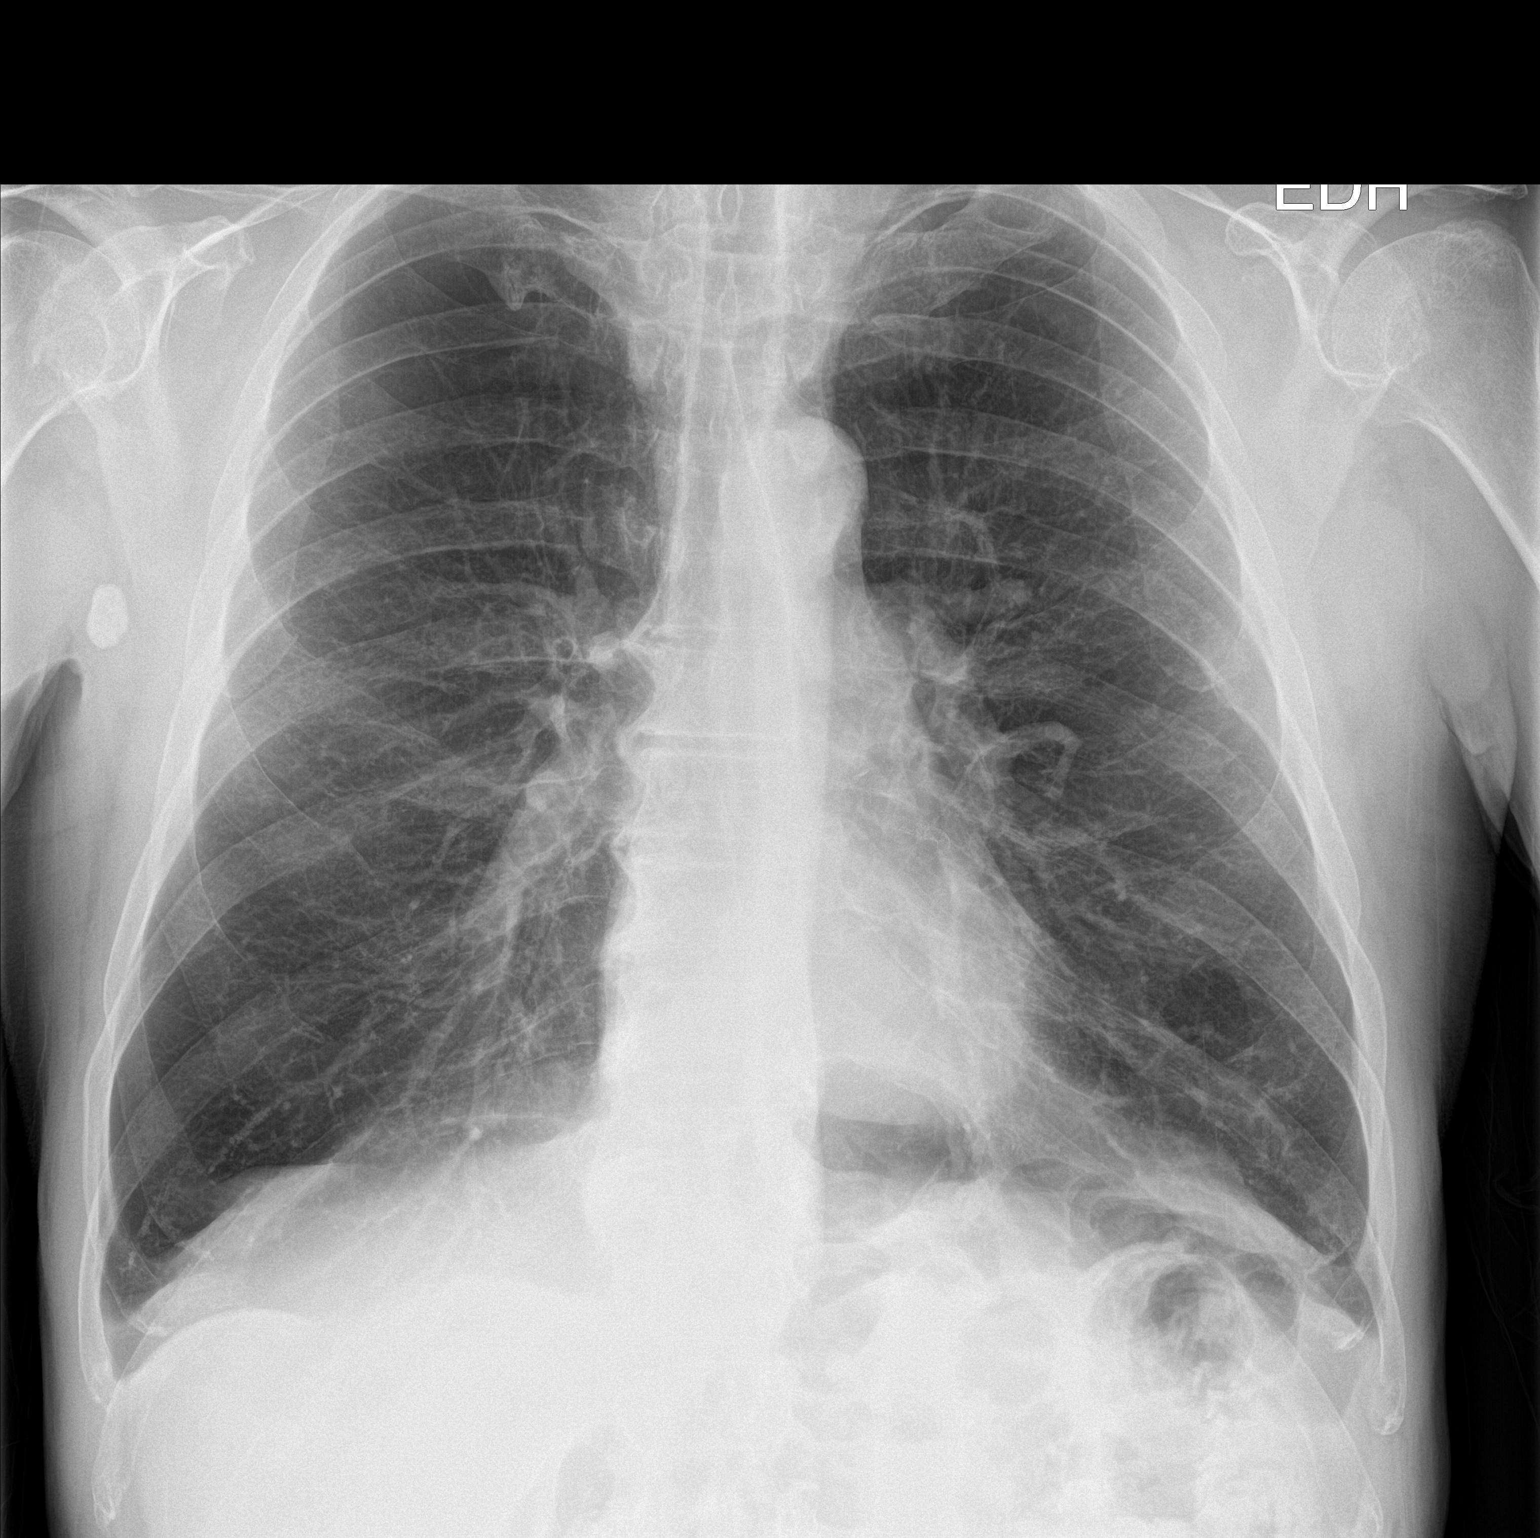
[im 2/2]
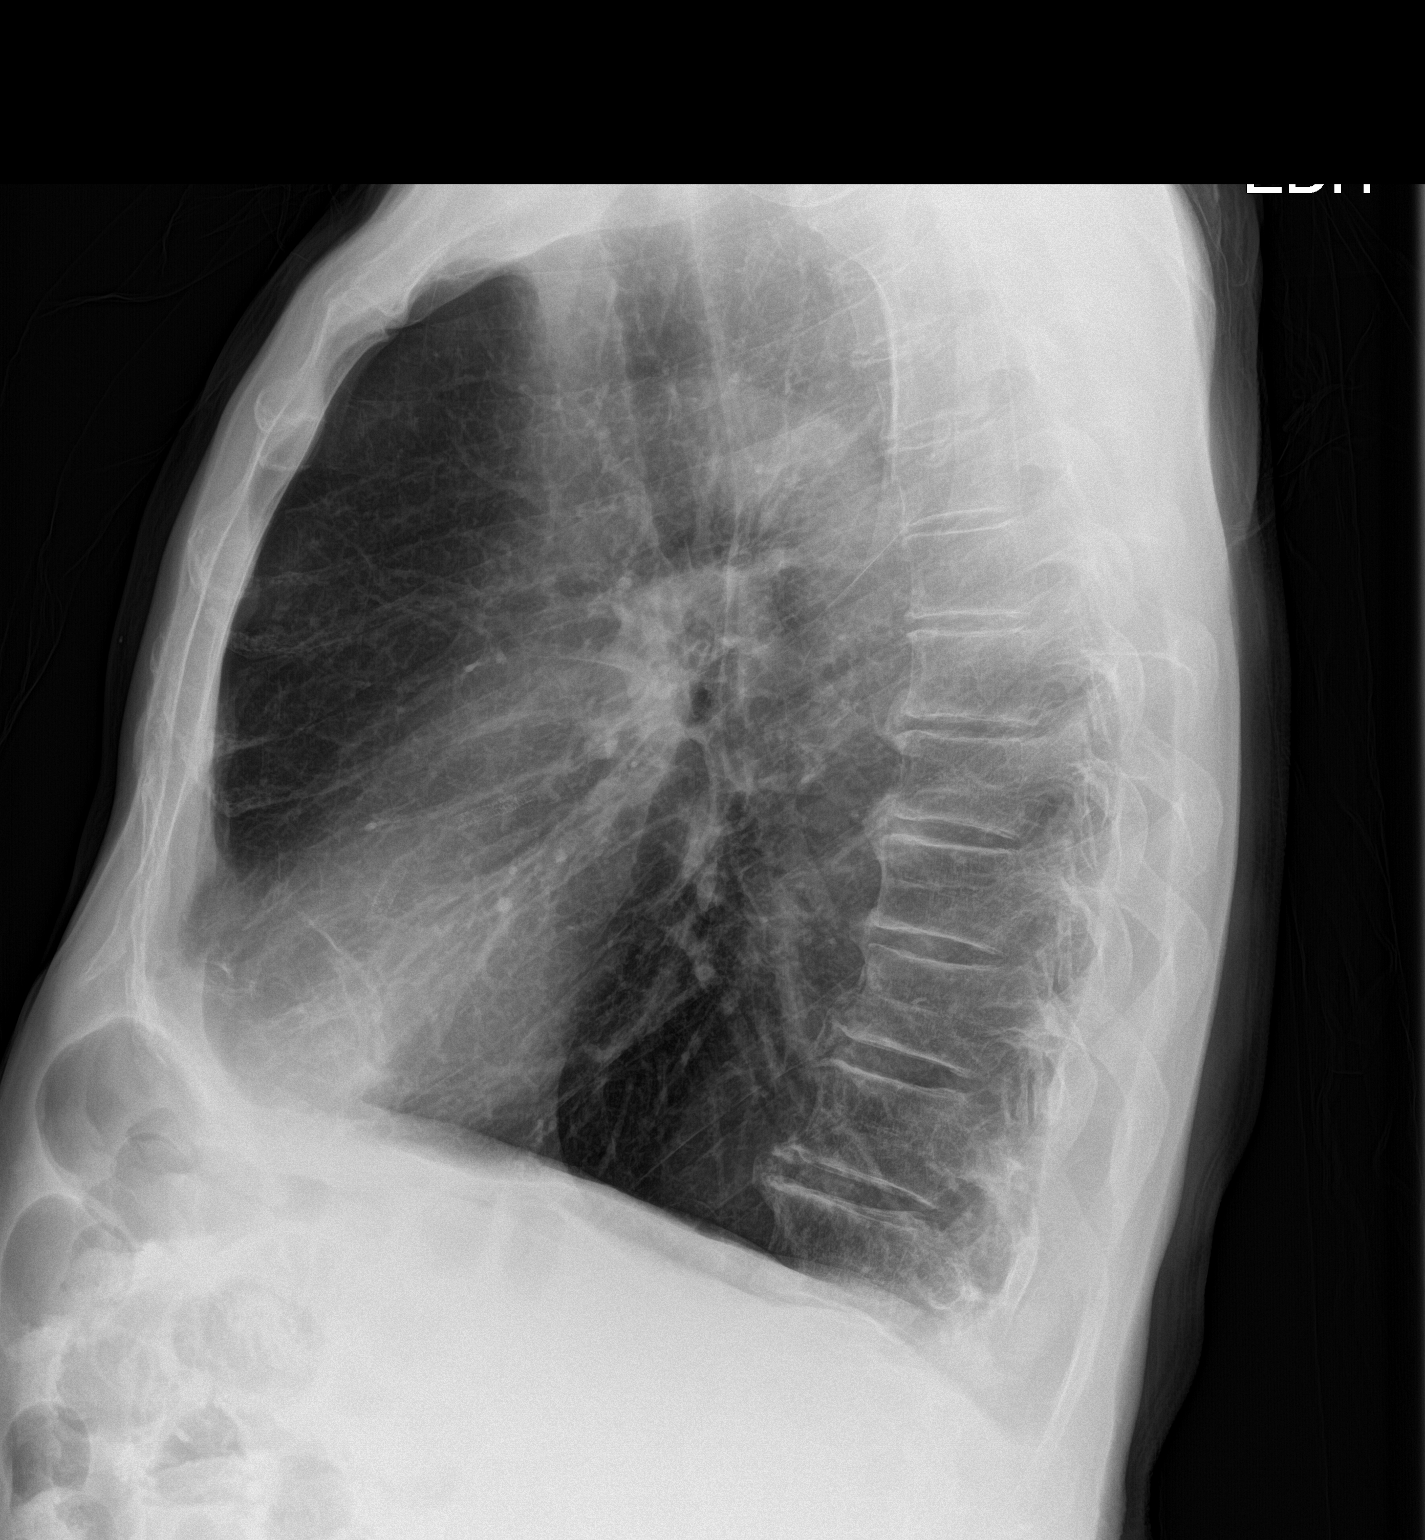

[2 of 2 positions shown; findings below may reference images not displayed]

FINDINGS: Emphysema and mild scarring in the lingula. There is no edema,
consolidation, effusion, or pneumothorax. Normal heart size and
mediastinal contours. Calcified right axillary lymph node.
IMPRESSION: Emphysema without acute superimposed finding.

## 2016-10-19 MED ORDER — IPRATROPIUM-ALBUTEROL 0.5-2.5 (3) MG/3ML IN SOLN
3.0000 mL | Freq: Once | RESPIRATORY_TRACT | Status: AC
  Administered 2016-10-19: 3 mL via RESPIRATORY_TRACT
  Filled 2016-10-19: qty 3

## 2016-10-19 MED ORDER — IPRATROPIUM-ALBUTEROL 0.5-2.5 (3) MG/3ML IN SOLN: 3.0000 mL | Freq: Four times a day (QID) | RESPIRATORY_TRACT | 1 refills | Status: DC | PRN

## 2016-10-19 MED ORDER — METHYLPREDNISOLONE SODIUM SUCC 125 MG IJ SOLR
125.0000 mg | Freq: Once | INTRAMUSCULAR | Status: AC
  Administered 2016-10-19: 125 mg via INTRAVENOUS
  Filled 2016-10-19: qty 2

## 2016-10-19 NOTE — ED Notes (Signed)
Pt in NAD. Pt given cup of water after okaying it with Dr. Jimmye Norman.

## 2016-10-19 NOTE — Telephone Encounter (Signed)
Pt states that he doesn't feel any better patient is still taking medicine that was given to him gave patient  results of CXR patient understands

## 2016-10-19 NOTE — ED Triage Notes (Signed)
Pt presents with reports of central chest pain that sometimes radiates to the left side of his face, dyspnea on exertion, shortness of breath and lightheadedness for approximately one week.

## 2016-10-19 NOTE — ED Notes (Signed)
Pt stating that he was having chest discomforting with SOB, denies n/v. Pt is a smoker, smoking about 3/4 a pack a day. Pt stating he has smoked for the last 25yrs and has been decreasing how much he is smoking. Pt stating that the discomfort has been happening the last 2 weeks but that it has gotten progressively worse. Pt stating that he does "cough up" mucous in the morning. Pt has hx of COPD.

## 2016-10-19 NOTE — Telephone Encounter (Signed)
Chest xray is normal ; no evidence of pneumonia or other abnormality. How is he feeling?

## 2016-10-19 NOTE — ED Provider Notes (Signed)
[facility] Emergency Department Provider Note        Time seen: ----------------------------------------- 5:17 PM on 10/19/2016 -----------------------------------------    I have reviewed the triage vital signs and the nursing notes.   HISTORY  Chief Complaint Shortness of Breath and Chest Pain    HPI Leslie Sims is a 65 y.o. male who presents to the ER for central chest pain sometimes radiates to the left side of his face. Patient has dyspnea on exertion, shortness of breath and lightheadedness for the last week. Patient states went lowing on his face her chest seems to make his symptoms worse. He is not sure if this has to do with his COPD or not. Patient states he has smoked for the last 45 years and is decreasing her much he smokes. Patient states his chest discomfort has been going on for 2 weeks but is getting worse.   Past Medical History:  Diagnosis Date  . Allergic rhinitis   . Anxiety   . Atherosclerosis   . Back pain   . Bronchitis    09-14-11  . CARDIOMYOPATHY 03/24/2010   did not tolerate Toprol and Lisinopril.   . Chest pain   . Chronic airway obstruction, not elsewhere classified   . Colon polyps 08/2012   colonoscopy; multiple colon polyps; repeat colonoscopy in one year.  [name] COPD (chronic obstructive pulmonary disease) ([place])   . Coronary artery disease 11/2009   Anterior MI with late presentation 11/2009. Cath showed a 100% proximal LAD occlusion, 99% mid RCA. Had a PCI and 2 DES to both LAD and RCA. EF was 35%. Nuclear stress test 05/13: prior anterior/inferior infarcts without ischemic, EF 43%, cardiac cath in 02/14: Widely patent stents with no other obstructive disease. EF 40%   . Depression   . Diabetes mellitus without complication ([place])   . GERD (gastroesophageal reflux disease)   . Headache(784.0)   . Helicobacter pylori (H. pylori)   . Hemorrhoids   . Hernia    inguinal  . Hyperlipidemia   . Hypertension   .  Insomnia   . MI (myocardial infarction)   . Myalgia   . Peptic ulcer   . Shortness of breath dyspnea   . Status post dilation of esophageal narrowing 2000  . Thyroid dysfunction   . Tinea pedis   . Wears dentures    partial upper    Patient Active Problem List   Diagnosis Date Noted  . Benign neoplasm of ascending colon   . Benign neoplasm of descending colon   . Benign neoplasm of sigmoid colon   . Personal history of colonic polyps   . Pulmonary nodules 01/05/2015  . Bruit of left carotid artery 11/04/2014  . Glucose intolerance (impaired glucose tolerance) 06/18/2014  . Sleep disorder 07/18/2013  . Seasonal and perennial allergic rhinitis 05/29/2013  . Annual physical exam 02/18/2013  . COPD exacerbation ([place]) 01/02/2013  . Sinusitis 01/02/2013  . Cough 11/14/2012  . Leg pain 05/29/2012  . Hypertension   . Bradycardia 04/20/2011  . SMOKER 07/30/2010  . CAD, NATIVE VESSEL 04/23/2010  . COLONIC POLYPS 03/24/2010  . Hyperlipidemia 03/24/2010  . DEPRESSION/ANXIETY 03/24/2010  . Chronic systolic heart failure ([place]) 03/24/2010  . COPD, very severe ([place]) 03/24/2010  . GERD 03/24/2010  . Essential hypertension 11/21/2009  . Chest pain 11/21/2009    Past Surgical History:  Procedure Laterality Date  . Admission  12/20/2012   COPD exacerbation.  [place].  [name] CARDIAC CATHETERIZATION    .  CARDIAC CATHETERIZATION  02/05/13   [facility]  . CARDIAC CATHETERIZATION  10/14   [facility] : patent stents with no change in anatomy. EF: 40$  . COLONOSCOPY    . COLONOSCOPY WITH PROPOFOL N/A 05/18/2016   Procedure: COLONOSCOPY WITH PROPOFOL;  Surgeon: Lucilla Lame, MD;  Location: [facility] ENDOSCOPY;  Service: Endoscopy;  Laterality: N/A;  . CORONARY ANGIOPLASTY WITH STENT PLACEMENT  09/2009   LAD 3.0 X23 mm Xience DES, RCA: 4.0 X 15 mm Xience DES  . ESOPHAGEAL DILATION    . ESOPHAGOGASTRODUODENOSCOPY  2008  . ESOPHAGOGASTRODUODENOSCOPY  08/20/2012  . HERNIA REPAIR  07/20/2012   L inguinal hernia repair  .  PENILE PROSTHESIS IMPLANT      Allergies Wellbutrin [bupropion]  Social History Social History  Substance Use Topics  . Smoking status: Current Every Day Smoker    Packs/day: 0.00    Years: 42.00    Types: Cigarettes  . Smokeless tobacco: Never Used     Comment: smoking 15-18 cigarettes daily  . Alcohol use No    Review of Systems Constitutional: Negative for fever. Cardiovascular:Positive for chest discomfort Respiratory: Positive for shortness of breath and cough Gastrointestinal: Negative for abdominal pain, vomiting and diarrhea. Genitourinary: Negative for dysuria. Musculoskeletal: Negative for back pain. Skin: Negative for rash. Neurological: Negative for headaches, focal weakness or numbness.  10-point ROS otherwise negative.  ____________________________________________   PHYSICAL EXAM:  VITAL SIGNS: ED Triage Vitals  Enc Vitals Group     BP 10/19/16 1606 (!) 109/92     Pulse Rate 10/19/16 1606 (!) 101     Resp 10/19/16 1606 18     Temp 10/19/16 1606 98.4 F (36.9 C)     Temp Source 10/19/16 1606 Oral     SpO2 10/19/16 1606 96 %     Weight 10/19/16 1601 148 lb (67.1 kg)     Height 10/19/16 1601 5\' 10"  (1.778 m)     Head Circumference --      Peak Flow --      Pain Score 10/19/16 1601 2     Pain Loc --      Pain Edu? --      Excl. in [place]? --     Constitutional: Alert and oriented. Well appearing and in no distress. Eyes: Conjunctivae are normal. PERRL. Normal extraocular movements. ENT   Head: Normocephalic and atraumatic.   Nose: No congestion/rhinnorhea.   Mouth/Throat: Mucous membranes are moist.   Neck: No stridor. Cardiovascular: Normal rate, regular rhythm. No murmurs, rubs, or gallops. Respiratory: Normal respiratory effort without tachypnea nor retractions. Breath sounds are clear and equal bilaterally. No wheezes/rales/rhonchi. Gastrointestinal: Soft and nontender. Normal bowel sounds Musculoskeletal: Nontender with normal  range of motion in all extremities. No lower extremity tenderness nor edema. Neurologic:  Normal speech and language. No gross focal neurologic deficits are appreciated.  Skin:  Skin is warm, dry and intact. No rash noted. Psychiatric: Mood and affect are normal. Speech and behavior are normal.  ____________________________________________  EKG: Interpreted by me.Sinus tachycardia with a rate of 102 bpm, normal PR interval, normal QRS size, normal QT, left axis deviation, incomplete right bundle branch block, possible septal infarct age indeterminate  ____________________________________________  ED COURSE:  Pertinent labs & imaging results that were available during my care of the patient were reviewed by me and considered in my medical decision making (see chart for details). Clinical Course  Patient presents to the ER with nonspecific chest discomfort. We will assess with labs and imaging. He received  DuoNeb since steroids.  Procedures ____________________________________________   LABS (pertinent positives/negatives)  Labs Reviewed  BASIC METABOLIC PANEL - Abnormal; Notable for the following:       Result Value   Potassium 3.4 (*)    Chloride 100 (*)    Glucose, Bld 150 (*)    All other components within normal limits  CBC  TROPONIN I  TROPONIN I    RADIOLOGY Chest x-ray  IMPRESSION: Emphysema without acute superimposed finding.   ____________________________________________  FINAL ASSESSMENT AND PLAN  COPD exacerbation, chest pain, anxiety  Plan: Patient with labs and imaging as dictated above. Patient is in no distress, currently feeling better after DuoNeb since steroids. I will prescribe DuoNeb treatment to take only if his previously prescribed inhalers are not helping. He is currently on prednisone. He is stable for outpatient follow-up.   Earleen Newport, MD   Note: This dictation was prepared with Dragon dictation. Any transcriptional errors  that result from this process are unintentional    Earleen Newport, MD 10/19/16 1843

## 2016-10-25 DIAGNOSIS — E119 Type 2 diabetes mellitus without complications: Secondary | ICD-10-CM | POA: Insufficient documentation

## 2016-10-27 ENCOUNTER — Ambulatory Visit (INDEPENDENT_AMBULATORY_CARE_PROVIDER_SITE_OTHER): Payer: BLUE CROSS/BLUE SHIELD | Admitting: Family Medicine

## 2016-10-27 ENCOUNTER — Encounter: Payer: Self-pay | Admitting: Family Medicine

## 2016-10-27 VITALS — BP 116/62 | HR 101 | Temp 98.0°F | Resp 16 | Ht 68.25 in | Wt 147.8 lb

## 2016-10-27 DIAGNOSIS — E119 Type 2 diabetes mellitus without complications: Secondary | ICD-10-CM | POA: Diagnosis not present

## 2016-10-27 DIAGNOSIS — K219 Gastro-esophageal reflux disease without esophagitis: Secondary | ICD-10-CM

## 2016-10-27 DIAGNOSIS — J3089 Other allergic rhinitis: Secondary | ICD-10-CM | POA: Diagnosis not present

## 2016-10-27 DIAGNOSIS — R0602 Shortness of breath: Secondary | ICD-10-CM

## 2016-10-27 DIAGNOSIS — Z23 Encounter for immunization: Secondary | ICD-10-CM

## 2016-10-27 DIAGNOSIS — I2510 Atherosclerotic heart disease of native coronary artery without angina pectoris: Secondary | ICD-10-CM

## 2016-10-27 DIAGNOSIS — J302 Other seasonal allergic rhinitis: Secondary | ICD-10-CM

## 2016-10-27 DIAGNOSIS — F341 Dysthymic disorder: Secondary | ICD-10-CM

## 2016-10-27 DIAGNOSIS — F17200 Nicotine dependence, unspecified, uncomplicated: Secondary | ICD-10-CM | POA: Diagnosis not present

## 2016-10-27 DIAGNOSIS — J449 Chronic obstructive pulmonary disease, unspecified: Secondary | ICD-10-CM | POA: Diagnosis not present

## 2016-10-27 MED ORDER — LORAZEPAM 0.5 MG PO TABS: 0.5000 mg | ORAL_TABLET | Freq: Two times a day (BID) | ORAL | 2 refills | Status: DC | PRN

## 2016-10-27 NOTE — Patient Instructions (Addendum)
  1.  Increase Lorazepam to twice daily --- morning and evening. 2. Continue Sertraline 100mg  twice daily. 3.  Continue to HOLD Metformin for diabetes. 4.  Decrease caffeine intake during the day.   IF you received an x-ray today, you will receive an invoice from [facility]. Please contact [facility] at [phone] with questions or concerns regarding your invoice.   IF you received labwork today, you will receive an invoice from [employer]. Please contact [facility] at [phone] with questions or concerns regarding your invoice.   Our billing staff will not be able to assist you with questions regarding bills from these companies.  You will be contacted with the lab results as soon as they are available. The fastest way to get your results is to activate your My Chart account. Instructions are located on the last page of this paperwork. If you have not heard from [place] regarding the results in 2 weeks, please contact this office.

## 2016-10-27 NOTE — Progress Notes (Signed)
Subjective:    Patient ID: Leslie Sims, male    DOB: 1951-05-25, 65 y.o.   MRN: [id number]  10/27/2016  Follow-up (SHORTNESS OF BREATH); Panic Attack (X 2 WEEKS); and Medication Refill (OCEAN and FLONASE)   HPI This 65 y.o. male presents for evaluation of persistent shortness of breath and possible anxiety attacks.  Head is fatigued, hurts all over, wants to sleep to avoid everything. Has been to emergency room. Taking medication bid and nerve pills without improvement.  Worrying about health.  I am convinced that not breathing well because of cardiac pathology.  When went to ED; asked if had undergone stress test; cannot tolerate stress test; ED provider insisted on nuclear stress test; now worrying excessively.  Next appointment with Arida on 11/05/16 for routine follow-up.  Before went to ED, called pulmonary provider; still has not received a call back from pulmonary provider which upset pt.  Went to ED alone; got upset with wife who would not go with patient; daughter met pt at ED; then daughter had to leave.  Wife did not want to engage.  Pt always  Goes wth wife to doctor's office.  Love wife to death but loses temper with dogs especially big dogs. Rudi Rummage who is a Environmental health practitioner.  Has been taking Sertraline to 165m bid for the past week.  Taking one Lorazepam per day; midday.  Has so much stuff that needs to get done; scared to leave the house; scared to go outside; always has panic attacks in shower.  Held not bar in shower.  Opened up shower curtain and had to hurry up and get out.  Sometimes shower is just fine. Burns in chest all the time; no dizziness; thoughts are scrambled in head.  No SI.  Drinks 2 cups in morning coffee.  Drinking 1/2 gallon a day caffeinated.  Stopped Metformin for one week; worried that side effects may be causing symptoms; some friends advised that not diabetic and pt doubting diagnosis.   Stopped Metformin two weeks ago; afraid that medication the cause of  current symptoms.  Wt Readings from Last 3 Encounters:  11/12/16 148 lb (67.1 kg)  11/05/16 147 lb (66.7 kg)  10/27/16 147 lb 12.8 oz (67 kg)    BP Readings from Last 3 Encounters:  11/12/16 130/78  11/05/16 110/64  10/27/16 116/62    Review of Systems  Constitutional: Negative for activity change, appetite change, chills, diaphoresis, fatigue and fever.  Respiratory: Positive for cough, shortness of breath and wheezing.   Cardiovascular: Negative for chest pain, palpitations and leg swelling.  Gastrointestinal: Negative for abdominal pain, diarrhea, nausea and vomiting.  Endocrine: Negative for cold intolerance, heat intolerance, polydipsia, polyphagia and polyuria.  Skin: Negative for color change, rash and wound.  Neurological: Negative for dizziness, tremors, seizures, syncope, facial asymmetry, speech difficulty, weakness, light-headedness, numbness and headaches.  Psychiatric/Behavioral: Positive for dysphoric mood. Negative for sleep disturbance. The patient is nervous/anxious.     Past Medical History:  Diagnosis Date  . Allergic rhinitis   . Anxiety   . Atherosclerosis   . Back pain   . Bronchitis    09-14-11  . CARDIOMYOPATHY 03/24/2010   did not tolerate Toprol and Lisinopril.   . Chest pain   . Chronic airway obstruction, not elsewhere classified   . Colon polyps 08/2012   colonoscopy; multiple colon polyps; repeat colonoscopy in one year.  .Marland KitchenCOPD (chronic obstructive pulmonary disease) ([place]   . Coronary artery disease 11/2009   Anterior  MI with late presentation 11/2009. Cath showed a 100% proximal LAD occlusion, 99% mid RCA. Had a PCI and 2 DES to both LAD and RCA. EF was 35%. Nuclear stress test 05/13: prior anterior/inferior infarcts without ischemic, EF 43%, cardiac cath in 02/14: Widely patent stents with no other obstructive disease. EF 40%   . Depression   . Diabetes mellitus without complication ([place])   . GERD (gastroesophageal reflux disease)   .  Headache(784.0)   . Helicobacter pylori (H. pylori)   . Hemorrhoids   . Hernia    inguinal  . Hyperlipidemia   . Hypertension   . Insomnia   . MI (myocardial infarction)   . Myalgia   . Peptic ulcer   . Shortness of breath dyspnea   . Status post dilation of esophageal narrowing 2000  . Thyroid dysfunction   . Tinea pedis   . Wears dentures    partial upper   Past Surgical History:  Procedure Laterality Date  . Admission  12/20/2012   COPD exacerbation.  [place].  [name] CARDIAC CATHETERIZATION    . CARDIAC CATHETERIZATION  02/05/13   [facility]  . CARDIAC CATHETERIZATION  10/14   [facility] : patent stents with no change in anatomy. EF: 40$  . CARDIAC CATHETERIZATION Left 11/12/2016   Procedure: Left Heart Cath and Coronary Angiography;  Surgeon: Wellington Hampshire, MD;  Location: [facility];  Service: Cardiovascular;  Laterality: Left;  . COLONOSCOPY    . COLONOSCOPY WITH PROPOFOL N/A 05/18/2016   Procedure: COLONOSCOPY WITH PROPOFOL;  Surgeon: Lucilla Lame, MD;  Location: [facility] ENDOSCOPY;  Service: Endoscopy;  Laterality: N/A;  . CORONARY ANGIOPLASTY WITH STENT PLACEMENT  09/2009   LAD 3.0 X23 mm Xience DES, RCA: 4.0 X 15 mm Xience DES  . ESOPHAGEAL DILATION    . ESOPHAGOGASTRODUODENOSCOPY  2008  . ESOPHAGOGASTRODUODENOSCOPY  08/20/2012  . HERNIA REPAIR  07/20/2012   L inguinal hernia repair  . PENILE PROSTHESIS IMPLANT     Allergies  Allergen Reactions  . Wellbutrin [Bupropion] Other (See Comments)    Unknown/"made me feel real funny"   Current Outpatient Prescriptions  Medication Sig Dispense Refill  . albuterol (PROVENTIL) (2.5 MG/3ML) 0.083% nebulizer solution Take 3 mLs (2.5 mg total) by nebulization every 6 (six) hours as needed for wheezing or shortness of breath. 360 vial 4  . aspirin 81 MG tablet Take 81 mg by mouth daily.      [name] atorvastatin (LIPITOR) 40 MG tablet Take 1 tablet (40 mg total) by mouth daily. 90 tablet 3  . CORTIFOAM 10 % rectal foam PLACE ONE APPLICATION  RECTALLY TWICE DAILY 15 g 5  . fluticasone (FLONASE) 50 MCG/ACT nasal spray Place 1 spray into both nostrils 2 (two) times daily. 16 g 0  . Fluticasone-Salmeterol (ADVAIR DISKUS) 500-50 MCG/DOSE AEPB Inhale 1 puff into the lungs 2 (two) times daily. 180 each 3  . ipratropium-albuterol (DUONEB) 0.5-2.5 (3) MG/3ML SOLN Take 3 mLs by nebulization every 6 (six) hours as needed. 360 mL 1  . LORazepam (ATIVAN) 0.5 MG tablet Take 1 tablet (0.5 mg total) by mouth 2 (two) times daily as needed for anxiety. 60 tablet 2  . losartan (COZAAR) 25 MG tablet Take 1 tablet (25 mg total) by mouth daily. 90 tablet 3  . metFORMIN (GLUCOPHAGE) 500 MG tablet TAKE 1 TABLET DAILY AFTER SUPPER 90 tablet 3  . Multiple Vitamin (MULTIVITAMIN) capsule Take 1 capsule by mouth daily.    . nicotine (NICODERM CQ - DOSED IN  MG/24 HOURS) 21 mg/24hr patch Place 1 patch (21 mg total) onto the skin daily. 28 patch 12  . NICOTROL 10 MG inhaler INHALE CONTENTS OF 1 CARTRIDGE (CONTINUOUS PUFFING) INTO THE LUNGS AS NEEDED FOR SMOKING CESSATION ( 168 each 4  . nitroGLYCERIN (NITROSTAT) 0.4 MG SL tablet Place 1 tablet (0.4 mg total) under the tongue every 5 (five) minutes as needed. 25 tablet 3  . sertraline (ZOLOFT) 100 MG tablet Take 1-2 tablets (100-200 mg total) by mouth daily. 180 tablet 1  . sucralfate (CARAFATE) 1 GM/10ML suspension TAKE 10ML( 2 TEASPOONSFUL) BY MOUTH FOUR TIMES DAILY 3600 mL 1  . tiotropium (SPIRIVA HANDIHALER) 18 MCG inhalation capsule Place 1 capsule (18 mcg total) into inhaler and inhale daily. 90 capsule 3  . VENTOLIN HFA 108 (90 Base) MCG/ACT inhaler USE 2 INHALATIONS INTO  THE LUNGS  EVERY 6 HOURS AS NEEDED FOR WHEEZING 54 g 1  . levofloxacin (LEVAQUIN) 500 MG tablet Take 1 tablet (500 mg total) by mouth daily. (Patient not taking: Reported on 11/12/2016) 7 tablet 0  . sodium chloride (OCEAN) 0.65 % SOLN nasal spray Place 2 sprays into both nostrils every 2 (two) hours while awake.  0   No current  facility-administered medications for this visit.    Social History   Social History  . Marital status: Married    Spouse name: N/A  . Number of children: 5  . Years of education: N/A   Occupational History  . 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 Smoking status: Current Every Day Smoker    Packs/day: 0.00    Years: 42.00    Types: Cigarettes  . Smokeless tobacco: Never Used     Comment: smoking 15-18 cigarettes daily  . Alcohol use No  . Drug use: No  . Sexual activity: Yes   Other Topics Concern  . Not on file   Social History Narrative   Marital status: married x 32 years; happily married.      Children: 4 children;  1 stepchild and 15 grandchildren; no great grandchildren.      Lives: with wife; 2 dogs, 1 cat.      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      Tobacco: 1 ppd x 45 years      Alcohol: none; previous alcoholism      Drugs: none      Exercise: no formal exercise; physically demanding job; owns horses.      Seatbelts:  100%      Guns: one loaded unsecured gun in the home.      Sunscreen: none   Family History  Problem Relation Age of Onset  . Heart attack Brother     Brother #1  . Diabetes Brother     brother #2  . Hypertension Brother     #3  . Coronary artery disease Father 11    deceased  . Heart attack Father   . Diabetes Father   . Heart disease Father   . COPD Mother 60    deceased  . Heart attack Sister 21    acute-cocaine abuse/deceased  . Alcohol abuse Sister     polysubstance abuse  . COPD Sister   . Alcohol abuse Sister     polysubstance abuse  . Heart disease Brother     AMI x multiple       Objective:    BP 116/62 (BP Location: Left Arm, Patient Position: Sitting,  Cuff Size: Normal)   Pulse (!) 101   Temp 98 F (36.7 C) (Oral)   Resp 16   Ht 5' 8.25" (1.734 m)   Wt 147 lb 12.8 oz (67 kg)   SpO2 94%   BMI 22.31 kg/m  Physical Exam  Constitutional: He is  oriented to person, place, and time. He appears well-developed and well-nourished. No distress.  HENT:  Head: Normocephalic and atraumatic.  Right Ear: External ear normal.  Left Ear: External ear normal.  Nose: Nose normal.  Mouth/Throat: Oropharynx is clear and moist.  Eyes: Conjunctivae and EOM are normal. Pupils are equal, round, and reactive to light.  Neck: Normal range of motion. Neck supple. Carotid bruit is not present. No thyromegaly present.  Cardiovascular: Normal rate, regular rhythm, normal heart sounds and intact distal pulses.  Exam reveals no gallop and no friction rub.   No murmur heard. Pulmonary/Chest: Effort normal and breath sounds normal. He has no wheezes. He has no rales.  Abdominal: Soft. Bowel sounds are normal. He exhibits no distension and no mass. There is no tenderness. There is no rebound and no guarding.  Lymphadenopathy:    He has no cervical adenopathy.  Neurological: He is alert and oriented to person, place, and time. No cranial nerve deficit. He exhibits normal muscle tone. Coordination normal.  Skin: Skin is warm and dry. No rash noted. He is not diaphoretic.  Psychiatric: He has a normal mood and affect. His behavior is normal. Judgment and thought content normal.  Nursing note and vitals reviewed.      Assessment & Plan:   1. Shortness of breath   2. COPD, very severe ([place])   3. Atherosclerosis of native coronary artery of native heart without angina pectoris   4. Seasonal and perennial allergic rhinitis   5. Gastroesophageal reflux disease without esophagitis   6. Type 2 diabetes mellitus without complication, without long-term current use of insulin ([facility])   7. DEPRESSION/ANXIETY   8. SMOKER   9. Need for prophylactic vaccination and inoculation against influenza   10. Need for prophylactic vaccination against Streptococcus pneumoniae (pneumococcus)    -persistent SOB despite improved COPD symptoms; no evidence of wheezing today.  S/p ED  visit with thorough evaluation; appointment in upcoming two weeks with cardiology.  Encourage pt to call for earlier appointment with Ar[facility].  Last cardiac cath 09/2013. -pt recently increased Zoloft to 232m daily last week for worsening anxiety; refill of Lorazepam also provided to treat anxiety while Zoloft is taking effect; extensive counseling provided in office today.  Decrease caffeine intake with acute anxiety. -discussed DMII diagnosis at length; advised patient that he suffers with mild DMII yet with ongoing tobacco abuse, COPD, and CAD, he warrants very aggressive treatment of DMII to prevent recurrent AMI.  Agreeable with holding Metformin at this time; I do not think that Metformin is etiology to current symptoms.  Follow Hgba1c closely. -s/p Pneumovax and flu vaccines during visit. -to ED for acute decline or worsening symptoms.   Orders Placed This Encounter  Procedures  . Pneumococcal polysaccharide vaccine 23-valent greater than or equal to 2yo subcutaneous/IM  . Flu Vaccine QUAD 36+ mos IM   Meds ordered this encounter  Medications  . LORazepam (ATIVAN) 0.5 MG tablet    Sig: Take 1 tablet (0.5 mg total) by mouth 2 (two) times daily as needed for anxiety.    Dispense:  60 tablet    Refill:  2    This prescription was filled on 10/05/2016.  Any refills authorized will be placed on file.    Return in about 4 weeks (around 11/24/2016) for recheck.   Clotile Whittington Elayne Guerin, M.D. [facility] [address] [place], [place]  [address] [phone] phone [phone] fax

## 2016-11-05 ENCOUNTER — Encounter: Payer: Self-pay | Admitting: Cardiovascular Disease

## 2016-11-05 ENCOUNTER — Ambulatory Visit (INDEPENDENT_AMBULATORY_CARE_PROVIDER_SITE_OTHER): Payer: BLUE CROSS/BLUE SHIELD | Admitting: Cardiovascular Disease

## 2016-11-05 VITALS — BP 110/64 | HR 98 | Ht 70.0 in | Wt 147.0 lb

## 2016-11-05 DIAGNOSIS — Z01812 Encounter for preprocedural laboratory examination: Secondary | ICD-10-CM

## 2016-11-05 DIAGNOSIS — I2510 Atherosclerotic heart disease of native coronary artery without angina pectoris: Secondary | ICD-10-CM

## 2016-11-05 DIAGNOSIS — I200 Unstable angina: Secondary | ICD-10-CM

## 2016-11-05 DIAGNOSIS — I5022 Chronic systolic (congestive) heart failure: Secondary | ICD-10-CM | POA: Diagnosis not present

## 2016-11-05 NOTE — Progress Notes (Signed)
Cardiology Office Note   Date:  11/05/2016   ID:  Leslie Sims, DOB [birth year], MRN [id number]  PCP:  [name], MD  Cardiologist:   Kathlyn Sacramento, MD   Chief Complaint  Patient presents with  . other    6 month f/u c/o chest pain, depression and sob. Meds reviewed verbally with pt.      History of Present Illness: Leslie Sims is a 65 y.o. male who presents for a follow-up visit regarding coronary artery disease and mild ischemic cardiomyopathy. He had previous anterior myocardial infarction in December of 2010. His LAD was occluded and RCA had a 99% stenosis. He had an angioplasty and drug-eluting stent placement to both LAD and RCA at that time. 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 Cardiac catheterization in 02/14  showed widely patent stents in the LAD and RCA with no evidence of other obstructive disease. Ejection fraction was 40% with mildly elevated left ventricular end-diastolic pressure.  Carotid Doppler in November, 2015 showed only mild bilateral carotid stenosis which remained stable in 2017. He reports significant worsening of exertional dyspnea and chest pain over the last few weeks and he had one emergency room visit for this. He feels extremely anxious about his health issues and he is extremely worried that there is something wrong with previous stents. He reports dyspnea with minimal activities with associated chest tightness. No orthopnea, PND or leg edema. He is time to quit smoking but has not been successful.    Past Medical History:  Diagnosis Date  . Allergic rhinitis   . Anxiety   . Atherosclerosis   . Back pain   . Bronchitis    09-14-11  . CARDIOMYOPATHY 03/24/2010   did not tolerate Toprol and Lisinopril.   . Chest pain   . Chronic airway obstruction, not elsewhere classified   . Colon polyps 08/2012   colonoscopy; multiple colon polyps; repeat colonoscopy in one year.  [name] COPD (chronic obstructive pulmonary  disease) ([place])   . Coronary artery disease 11/2009   Anterior MI with late presentation 11/2009. Cath showed a 100% proximal LAD occlusion, 99% mid RCA. Had a PCI and 2 DES to both LAD and RCA. EF was 35%. Nuclear stress test 05/13: prior anterior/inferior infarcts without ischemic, EF 43%, cardiac cath in 02/14: Widely patent stents with no other obstructive disease. EF 40%   . Depression   . Diabetes mellitus without complication ([place])   . GERD (gastroesophageal reflux disease)   . Headache(784.0)   . Helicobacter pylori (H. pylori)   . Hemorrhoids   . Hernia    inguinal  . Hyperlipidemia   . Hypertension   . Insomnia   . MI (myocardial infarction)   . Myalgia   . Peptic ulcer   . Shortness of breath dyspnea   . Status post dilation of esophageal narrowing 2000  . Thyroid dysfunction   . Tinea pedis   . Wears dentures    partial upper    Past Surgical History:  Procedure Laterality Date  . Admission  12/20/2012   COPD exacerbation.  [place].  [name] CARDIAC CATHETERIZATION    . CARDIAC CATHETERIZATION  02/05/13   [facility]  . CARDIAC CATHETERIZATION  10/14   [facility] : patent stents with no change in anatomy. EF: 40$  . COLONOSCOPY    . COLONOSCOPY WITH PROPOFOL N/A 05/18/2016   Procedure: COLONOSCOPY WITH PROPOFOL;  Surgeon: Lucilla Lame, MD;  Location: [facility] ENDOSCOPY;  Service: Endoscopy;  Laterality:  N/A;  . CORONARY ANGIOPLASTY WITH STENT PLACEMENT  09/2009   LAD 3.0 X23 mm Xience DES, RCA: 4.0 X 15 mm Xience DES  . ESOPHAGEAL DILATION    . ESOPHAGOGASTRODUODENOSCOPY  2008  . ESOPHAGOGASTRODUODENOSCOPY  08/20/2012  . HERNIA REPAIR  07/20/2012   L inguinal hernia repair  . PENILE PROSTHESIS IMPLANT       Current Outpatient Prescriptions  Medication Sig Dispense Refill  . albuterol (PROVENTIL) (2.5 MG/3ML) 0.083% nebulizer solution Take 3 mLs (2.5 mg total) by nebulization every 6 (six) hours as needed for wheezing or shortness of breath. 360 vial 4  . aspirin 81 MG tablet Take 81 mg by  mouth daily.      [name] atorvastatin (LIPITOR) 40 MG tablet Take 1 tablet (40 mg total) by mouth daily. 90 tablet 3  . blood glucose meter kit and supplies KIT Dispense based on patient and insurance preference. Use up to four times daily as directed. (FOR ICD-9 250.00, 250.01). 1 each 0  . CORTIFOAM 10 % rectal foam PLACE ONE APPLICATION RECTALLY TWICE DAILY 15 g 5  . fluticasone (FLONASE) 50 MCG/ACT nasal spray Place 1 spray into both nostrils 2 (two) times daily. 16 g 0  . Fluticasone-Salmeterol (ADVAIR DISKUS) 500-50 MCG/DOSE AEPB Inhale 1 puff into the lungs 2 (two) times daily. 180 each 3  . ipratropium-albuterol (DUONEB) 0.5-2.5 (3) MG/3ML SOLN Take 3 mLs by nebulization every 6 (six) hours as needed. 360 mL 1  . levofloxacin (LEVAQUIN) 500 MG tablet Take 1 tablet (500 mg total) by mouth daily. 7 tablet 0  . LORazepam (ATIVAN) 0.5 MG tablet Take 1 tablet (0.5 mg total) by mouth 2 (two) times daily as needed for anxiety. 60 tablet 2  . losartan (COZAAR) 25 MG tablet Take 1 tablet (25 mg total) by mouth daily. 90 tablet 3  . metFORMIN (GLUCOPHAGE) 500 MG tablet TAKE 1 TABLET DAILY AFTER SUPPER 90 tablet 3  . Multiple Vitamin (MULTIVITAMIN) capsule Take 1 capsule by mouth daily.    . nicotine (NICODERM CQ - DOSED IN MG/24 HOURS) 21 mg/24hr patch Place 1 patch (21 mg total) onto the skin daily. 28 patch 12  . NICOTROL 10 MG inhaler INHALE CONTENTS OF 1 CARTRIDGE (CONTINUOUS PUFFING) INTO THE LUNGS AS NEEDED FOR SMOKING CESSATION ( 168 each 4  . nitroGLYCERIN (NITROSTAT) 0.4 MG SL tablet Place 1 tablet (0.4 mg total) under the tongue every 5 (five) minutes as needed. 25 tablet 3  . sertraline (ZOLOFT) 100 MG tablet Take 1-2 tablets (100-200 mg total) by mouth daily. 180 tablet 1  . sodium chloride (OCEAN) 0.65 % SOLN nasal spray Place 2 sprays into both nostrils every 2 (two) hours while awake.  0  . sucralfate (CARAFATE) 1 GM/10ML suspension TAKE 10ML( 2 TEASPOONSFUL) BY MOUTH FOUR TIMES DAILY 3600  mL 1  . tiotropium (SPIRIVA HANDIHALER) 18 MCG inhalation capsule Place 1 capsule (18 mcg total) into inhaler and inhale daily. 90 capsule 3  . VENTOLIN HFA 108 (90 Base) MCG/ACT inhaler USE 2 INHALATIONS INTO  THE LUNGS  EVERY 6 HOURS AS NEEDED FOR WHEEZING 54 g 1   No current facility-administered medications for this visit.     Allergies:   Wellbutrin [bupropion]    Social History:  The patient  reports that he has been smoking Cigarettes.  He has been smoking about 0.00 packs per day for the past 42.00 years. He has never used smokeless tobacco. He reports that he does not drink alcohol or use  drugs.   Family History:  The patient's family history includes Alcohol abuse in his sister and sister; COPD in his sister; COPD (age of onset: 63) in his mother; Coronary artery disease (age of onset: 41) in his father; Diabetes in his brother and father; Heart attack in his brother and father; Heart attack (age of onset: 57) in his sister; Heart disease in his brother and father; Hypertension in his brother.    ROS:  Please see the history of present illness.   Otherwise, review of systems are positive for none.   All other systems are reviewed and negative.    PHYSICAL EXAM: VS:  BP 110/64 (BP Location: Left Arm, Patient Position: Sitting, Cuff Size: Normal)   Pulse 98   Ht 5' 10" (1.778 m)   Wt 147 lb (66.7 kg)   BMI 21.09 kg/m  , BMI Body mass index is 21.09 kg/m. GEN: Well nourished, well developed, in no acute distress  HEENT: normal  Neck: no JVD, carotid bruits, or masses Cardiac: RRR; no murmurs, rubs, or gallops,no edema  Respiratory:  clear to auscultation bilaterally, normal work of breathing GI: soft, nontender, nondistended, + BS MS: no deformity or atrophy  Skin: warm and dry, no rash Neuro:  Strength and sensation are intact Psych: euthymic mood, full affect   EKG:  EKG is ordered today. The ekg ordered today demonstrates normal sinus rhythm with old anteroseptal  infarct.   Recent Labs: 10/13/2016: ALT 18 10/19/2016: BUN 11; Creatinine, Ser 0.82; Hemoglobin 15.2; Platelets 259; Potassium 3.4; Sodium 137    Lipid Panel    Component Value Date/Time   CHOL 123 (L) 08/24/2016 1344   TRIG 126 08/24/2016 1344   TRIG 65 01/15/2010   HDL 68 08/24/2016 1344   CHOLHDL 1.8 08/24/2016 1344   VLDL 25 08/24/2016 1344   LDLCALC 30 08/24/2016 1344      Wt Readings from Last 3 Encounters:  11/05/16 147 lb (66.7 kg)  10/27/16 147 lb 12.8 oz (67 kg)  10/19/16 148 lb (67.1 kg)        ASSESSMENT AND PLAN:  1.  Coronary artery disease involving native coronary arteries with unstable angina:  The patient reports significant worsening of exertional chest pain and shortness of breath which is worrisome given his cardiac history. His EKG is consistent with prior anterior infarct. Anxiety might be contributing but that's a diagnosis of exclusion. I gave him the option of either stress testing or proceeding directly with cardiac catheterization. The patient was not able to exercise on a treadmill previously and given his advanced lung disease, stress testing is going to have decreased accuracy. Thus, I think it's more reasonable to proceed directly with cardiac catheterization which was discussed with him today. Risks and benefits were explained and we'll plan on doing this next week.  2. Ischemic cardiomyopathy: Most recent ejection fraction was 40%. He did not tolerate treatment with a beta blocker and currently on low-dose losartan.  3. Hyperlipidemia: Continue treatment with atorvastatin.  4. Tobacco use: Unfortunately, he is not able to quit smoking.    Disposition:   FU with me in 1 months  Signed,  Kathlyn Sacramento, MD  11/05/2016 2:28 PM    [place]

## 2016-11-05 NOTE — Patient Instructions (Addendum)
Medication Instructions:  Your physician recommends that you continue on your current medications as directed. Please refer to the Current Medication list given to you today.   Labwork: BMET, CBC, PT/INR  Testing/Procedures: Your physician has requested that you have a cardiac catheterization. Cardiac catheterization is used to diagnose and/or treat various heart conditions. Doctors may recommend this procedure for a number of different reasons. The most common reason is to evaluate chest pain. Chest pain can be a symptom of coronary artery disease (CAD), and cardiac catheterization can show whether plaque is narrowing or blocking your heart's arteries. This procedure is also used to evaluate the valves, as well as measure the blood flow and oxygen levels in different parts of your heart. For further information please visit [email or web address]. Please follow instruction sheet, as given.  [facility] Cardiac Cath Instructions   You are scheduled for a Cardiac Cath on: Friday, November 24  Please arrive at 6:30am on the day of your procedure  Please expect a call from our [facility] to pre-register you  Do not eat/drink anything after midnight  Someone will need to drive you home  It is recommended someone be with you for the first 24 hours after your procedure  Wear clothes that are easy to get on/off and wear slip on shoes if possible   Medications bring a current list of all medications with you  _xx__ Do not take these medications before your procedure. DO NOT TAKE METFORMIN 24 HOURS BEFORE AND FOR 48 HOURS AFTER YOUR PROCEDURE.    Day of your procedure: Arrive at the [facility] entrance.  Free valet service is available.  After entering the [facility] please check-in at the registration desk (1st desk on your right) to receive your armband. After receiving your armband someone will escort you to the cardiac cath/special procedures waiting area.  The usual  length of stay after your procedure is about 2 to 3 hours.  This can vary.  If you have any questions, please call our office at [phone], or you may call the cardiac cath lab at [facility] directly at [phone]   Follow-Up: Your physician recommends that you schedule a follow-up appointment in: one month with Dr. Fletcher Anon.    Any Other Special Instructions Will Be Listed Below (If Applicable).     If you need a refill on your cardiac medications before your next appointment, please call your pharmacy.   Angiogram An angiogram, also called angiography, is a procedure used to look at the blood vessels. In this procedure, dye is injected through a long, thin tube (catheter) into an artery. X-rays are then taken. The X-rays will show if there is a blockage or problem in a blood vessel. Tell a health care provider about:  Any allergies you have, including allergies to shellfish or contrast dye.  All medicines you are taking, including vitamins, herbs, eye drops, creams, and over-the-counter medicines.  Any problems you or family members have had with anesthetic medicines.  Any blood disorders you have.  Any surgeries you have had.  Any previous kidney problems or failure you have had.  Any medical conditions you have.  Possibility of pregnancy, if this applies. What are the risks? Generally, an angiogram is a safe procedure. However, as with any procedure, problems can occur. Possible problems include:  Injury to the blood vessels, including rupture or bleeding.  Infection or bruising at the catheter site.  Allergic reaction to the dye or contrast used.  Kidney damage from  the dye or contrast used.  Blood clots that can lead to a stroke or heart attack. What happens before the procedure?  Do not eat or drink after midnight on the night before the procedure, or as directed by your health care provider.  Ask your health care provider if you may drink enough water to take  any needed medicines the morning of the procedure. What happens during the procedure?  You may be given a medicine to help you relax (sedative) before and during the procedure. This medicine is given through an IV access tube that is inserted into one of your veins.  The area where the catheter will be inserted will be washed and shaved. This is usually done in the groin but may be done in the fold of your arm (near your elbow) or in the wrist.  A medicine will be given to numb the area where the catheter will be inserted (local anesthetic).  The catheter will be inserted with a guide wire into an artery. The catheter is guided by using a type of X-ray (fluoroscopy) to the blood vessel being examined.  Dye is then injected into the catheter, and X-rays are taken. The dye helps to show where any narrowing or blockages are located. What happens after the procedure?  If the procedure is done through the leg, you will be kept in bed lying flat for several hours. You will be instructed to not bend or cross your legs.  The insertion site will be checked frequently.  The pulse in your feet or wrist will be checked frequently.  Additional blood tests, X-rays, and electrocardiography may be done.  You may need to stay in the hospital overnight for observation. This information is not intended to replace advice given to you by your health care provider. Make sure you discuss any questions you have with your health care provider. Document Released: 09/15/2005 Document Revised: 05/19/2016 Document Reviewed: 05/09/2013 Elsevier Interactive Patient Education  2017 [place] After Refer to this sheet in the next few weeks. These instructions provide you with information about caring for yourself after your procedure. Your health care provider may also give you more specific instructions. Your treatment has been planned according to current medical practices, but problems sometimes  occur. Call your health care provider if you have any problems or questions after your procedure. What can I expect after the procedure? After your procedure, it is typical to have the following:  Bruising at the catheter insertion site that usually fades within 1-2 weeks.  Blood collecting in the tissue (hematoma) that may be painful to the touch. It should usually decrease in size and tenderness within 1-2 weeks. Follow these instructions at home:  Take medicines only as directed by your health care provider.  You may shower 24-48 hours after the procedure or as directed by your health care provider. Remove the bandage (dressing) and gently wash the site with plain soap and water. Pat the area dry with a clean towel. Do not rub the site, because this may cause bleeding.  Do not take baths, swim, or use a hot tub until your health care provider approves.  Check your insertion site every day for redness, swelling, or drainage.  Do not apply powder or lotion to the site.  Do not lift over 10 lb (4.5 kg) for 5 days after your procedure or as directed by your health care provider.  Ask your health care provider when it is okay to:  Return to work or school.  Resume usual physical activities or sports.  Resume sexual activity.  Do not drive home if you are discharged the same day as the procedure. Have someone else drive you.  You may drive 24 hours after the procedure unless otherwise instructed by your health care provider.  Do not operate machinery or power tools for 24 hours after the procedure or as directed by your health care provider.  If your procedure was done as an outpatient procedure, which means that you went home the same day as your procedure, a responsible adult should be with you for the first 24 hours after you arrive home.  Keep all follow-up visits as directed by your health care provider. This is important. Contact a health care provider if:  You have a  fever.  You have chills.  You have increased bleeding from the catheter insertion site. Hold pressure on the site. Get help right away if:  You have unusual pain at the catheter insertion site.  You have redness, warmth, or swelling at the catheter insertion site.  You have drainage (other than a small amount of blood on the dressing) from the catheter insertion site.  The catheter insertion site is bleeding, and the bleeding does not stop after 30 minutes of holding steady pressure on the site.  The area near or just beyond the catheter insertion site becomes pale, cool, tingly, or numb. This information is not intended to replace advice given to you by your health care provider. Make sure you discuss any questions you have with your health care provider. Document Released: 06/24/2005 Document Revised: 05/13/2016 Document Reviewed: 05/09/2013 Elsevier Interactive Patient Education  2017 [employer].

## 2016-11-06 LAB — BASIC METABOLIC PANEL
BUN/Creatinine Ratio: 12 (ref 10–24)
BUN: 10 mg/dL (ref 8–27)
CALCIUM: 9.6 mg/dL (ref 8.6–10.2)
CO2: 25 mmol/L (ref 18–29)
CREATININE: 0.82 mg/dL (ref 0.76–1.27)
Chloride: 100 mmol/L (ref 96–106)
GFR calc Af Amer: 109 mL/min/{1.73_m2} (ref >59)
GFR calc non Af Amer: 94 mL/min/{1.73_m2} (ref >59)
GLUCOSE: 148 mg/dL — AB (ref 65–99)
Potassium: 4.2 mmol/L (ref 3.5–5.2)
SODIUM: 142 mmol/L (ref 134–144)

## 2016-11-06 LAB — CBC
HEMOGLOBIN: 15.3 g/dL (ref 12.6–17.7)
Hematocrit: 44.4 % (ref 37.5–51.0)
MCH: 30.4 pg (ref 26.6–33.0)
MCHC: 34.5 g/dL (ref 31.5–35.7)
MCV: 88 fL (ref 79–97)
PLATELETS: 217 10*3/uL (ref 150–379)
RBC: 5.04 x10E6/uL (ref 4.14–5.80)
RDW: 13 % (ref 12.3–15.4)
WBC: 6.7 10*3/uL (ref 3.4–10.8)

## 2016-11-06 LAB — PROTIME-INR
INR: 1 (ref 0.8–1.2)
Prothrombin Time: 10.8 s (ref 9.1–12.0)

## 2016-11-10 ENCOUNTER — Telehealth: Payer: Self-pay | Admitting: Cardiovascular Disease

## 2016-11-10 NOTE — Telephone Encounter (Signed)
Left detailed message on pt home VM regarding 11/24 cardiac catheterization. Provided CB number.

## 2016-11-12 ENCOUNTER — Encounter: Payer: Self-pay | Admitting: *Deleted

## 2016-11-12 ENCOUNTER — Encounter
Admission: RE | Disposition: A | Discharge status: Home / Self Care | Payer: Self-pay | Source: Ambulatory Visit | Attending: Cardiovascular Disease

## 2016-11-12 ENCOUNTER — Ambulatory Visit
Admission: RE | Admit: 2016-11-12 | Discharge: 2016-11-12 | Disposition: A | Discharge status: Home / Self Care | Payer: BLUE CROSS/BLUE SHIELD | Source: Ambulatory Visit | Attending: Cardiovascular Disease | Admitting: Cardiovascular Disease

## 2016-11-12 DIAGNOSIS — I25110 Atherosclerotic heart disease of native coronary artery with unstable angina pectoris: Secondary | ICD-10-CM

## 2016-11-12 DIAGNOSIS — F419 Anxiety disorder, unspecified: Secondary | ICD-10-CM | POA: Insufficient documentation

## 2016-11-12 DIAGNOSIS — Z7984 Long term (current) use of oral hypoglycemic drugs: Secondary | ICD-10-CM | POA: Diagnosis not present

## 2016-11-12 DIAGNOSIS — F17210 Nicotine dependence, cigarettes, uncomplicated: Secondary | ICD-10-CM | POA: Diagnosis not present

## 2016-11-12 DIAGNOSIS — Z955 Presence of coronary angioplasty implant and graft: Secondary | ICD-10-CM | POA: Diagnosis not present

## 2016-11-12 DIAGNOSIS — E119 Type 2 diabetes mellitus without complications: Secondary | ICD-10-CM | POA: Insufficient documentation

## 2016-11-12 DIAGNOSIS — Z79899 Other long term (current) drug therapy: Secondary | ICD-10-CM | POA: Insufficient documentation

## 2016-11-12 DIAGNOSIS — G4700 Insomnia, unspecified: Secondary | ICD-10-CM | POA: Diagnosis not present

## 2016-11-12 DIAGNOSIS — I252 Old myocardial infarction: Secondary | ICD-10-CM | POA: Diagnosis not present

## 2016-11-12 DIAGNOSIS — R079 Chest pain, unspecified: Secondary | ICD-10-CM | POA: Diagnosis present

## 2016-11-12 DIAGNOSIS — I200 Unstable angina: Secondary | ICD-10-CM

## 2016-11-12 DIAGNOSIS — J449 Chronic obstructive pulmonary disease, unspecified: Secondary | ICD-10-CM | POA: Diagnosis not present

## 2016-11-12 DIAGNOSIS — E785 Hyperlipidemia, unspecified: Secondary | ICD-10-CM | POA: Insufficient documentation

## 2016-11-12 DIAGNOSIS — F329 Major depressive disorder, single episode, unspecified: Secondary | ICD-10-CM | POA: Insufficient documentation

## 2016-11-12 DIAGNOSIS — I10 Essential (primary) hypertension: Secondary | ICD-10-CM | POA: Diagnosis not present

## 2016-11-12 DIAGNOSIS — J309 Allergic rhinitis, unspecified: Secondary | ICD-10-CM | POA: Insufficient documentation

## 2016-11-12 DIAGNOSIS — Z8249 Family history of ischemic heart disease and other diseases of the circulatory system: Secondary | ICD-10-CM | POA: Diagnosis not present

## 2016-11-12 DIAGNOSIS — I255 Ischemic cardiomyopathy: Secondary | ICD-10-CM | POA: Diagnosis not present

## 2016-11-12 DIAGNOSIS — I25118 Atherosclerotic heart disease of native coronary artery with other forms of angina pectoris: Secondary | ICD-10-CM | POA: Insufficient documentation

## 2016-11-12 DIAGNOSIS — Z7982 Long term (current) use of aspirin: Secondary | ICD-10-CM | POA: Insufficient documentation

## 2016-11-12 DIAGNOSIS — K219 Gastro-esophageal reflux disease without esophagitis: Secondary | ICD-10-CM | POA: Insufficient documentation

## 2016-11-12 DIAGNOSIS — Z7951 Long term (current) use of inhaled steroids: Secondary | ICD-10-CM | POA: Insufficient documentation

## 2016-11-12 HISTORY — PX: CARDIAC CATHETERIZATION: SHX172

## 2016-11-12 SURGERY — LEFT HEART CATH AND CORONARY ANGIOGRAPHY
Anesthesia: Moderate Sedation | Laterality: Left

## 2016-11-12 MED ORDER — SODIUM CHLORIDE 0.9% FLUSH: 3.0000 mL | Freq: Two times a day (BID) | INTRAVENOUS | Status: DC

## 2016-11-12 MED ORDER — HEPARIN SODIUM (PORCINE) 1000 UNIT/ML IJ SOLN
INTRAMUSCULAR | Status: DC | PRN
  Administered 2016-11-12: 3500 [IU] via INTRAVENOUS

## 2016-11-12 MED ORDER — MIDAZOLAM HCL 2 MG/2ML IJ SOLN
INTRAMUSCULAR | Status: DC | PRN
  Administered 2016-11-12: 1 mg via INTRAVENOUS

## 2016-11-12 MED ORDER — FENTANYL CITRATE (PF) 100 MCG/2ML IJ SOLN
INTRAMUSCULAR | Status: AC
  Filled 2016-11-12: qty 2

## 2016-11-12 MED ORDER — IOPAMIDOL (ISOVUE-300) INJECTION 61%
INTRAVENOUS | Status: DC | PRN
  Administered 2016-11-12: 75 mL via INTRA_ARTERIAL

## 2016-11-12 MED ORDER — SODIUM CHLORIDE 0.9 % IV SOLN: 250.0000 mL | INTRAVENOUS | Status: DC | PRN

## 2016-11-12 MED ORDER — HEPARIN (PORCINE) IN NACL 2-0.9 UNIT/ML-% IJ SOLN
INTRAMUSCULAR | Status: AC
  Filled 2016-11-12: qty 500

## 2016-11-12 MED ORDER — MIDAZOLAM HCL 2 MG/2ML IJ SOLN
INTRAMUSCULAR | Status: AC
  Filled 2016-11-12: qty 2

## 2016-11-12 MED ORDER — VERAPAMIL HCL 2.5 MG/ML IV SOLN
INTRAVENOUS | Status: AC
  Filled 2016-11-12: qty 2

## 2016-11-12 MED ORDER — ASPIRIN 81 MG PO CHEW: 81.0000 mg | CHEWABLE_TABLET | ORAL | Status: DC

## 2016-11-12 MED ORDER — SODIUM CHLORIDE 0.9 % IV SOLN
INTRAVENOUS | Status: DC
  Administered 2016-11-12: 07:00:00 via INTRAVENOUS

## 2016-11-12 MED ORDER — VERAPAMIL HCL 2.5 MG/ML IV SOLN
INTRAVENOUS | Status: DC | PRN
  Administered 2016-11-12: 2.5 mg via INTRA_ARTERIAL

## 2016-11-12 MED ORDER — HEPARIN SODIUM (PORCINE) 1000 UNIT/ML IJ SOLN
INTRAMUSCULAR | Status: AC
  Filled 2016-11-12: qty 1

## 2016-11-12 MED ORDER — SODIUM CHLORIDE 0.9% FLUSH: 3.0000 mL | INTRAVENOUS | Status: DC | PRN

## 2016-11-12 MED ORDER — FENTANYL CITRATE (PF) 100 MCG/2ML IJ SOLN
INTRAMUSCULAR | Status: DC | PRN
  Administered 2016-11-12: 25 ug via INTRAVENOUS

## 2016-11-12 SURGICAL SUPPLY — 8 items
CATH 5FR PIGTAIL DIAGNOSTIC (CATHETERS) ×2 IMPLANT
CATH OPTITORQUE JACKY 4.0 5F (CATHETERS) ×2 IMPLANT
DEVICE RAD TR BAND REGULAR (VASCULAR PRODUCTS) ×2 IMPLANT
GLIDESHEATH SLEND SS 6F .021 (SHEATH) ×2 IMPLANT
KIT MANI 3VAL PERCEP (MISCELLANEOUS) ×2 IMPLANT
PACK CARDIAC CATH (CUSTOM PROCEDURE TRAY) ×2 IMPLANT
WIRE HITORQ VERSACORE ST 145CM (WIRE) ×2 IMPLANT
WIRE ROSEN-J .035X260CM (WIRE) ×2 IMPLANT

## 2016-11-12 NOTE — Interval H&P Note (Signed)
Cath Lab Visit (complete for each Cath Lab visit)  Clinical Evaluation Leading to the Procedure:   ACS: No.  Non-ACS:    Anginal Classification: CCS III  Anti-ischemic medical therapy: Minimal Therapy (1 class of medications)  Non-Invasive Test Results: No non-invasive testing performed  Prior CABG: No previous CABG      History and Physical Interval Note:  11/12/2016 7:46 AM  Leslie Sims  has presented today for surgery, with the diagnosis of LT cath   Chest pain  The various methods of treatment have been discussed with the patient and family. After consideration of risks, benefits and other options for treatment, the patient has consented to  Procedure(s): Left Heart Cath and Coronary Angiography (Left) as a surgical intervention .  The patient's history has been reviewed, patient examined, no change in status, stable for surgery.  I have reviewed the patient's chart and labs.  Questions were answered to the patient's satisfaction.     Leslie Sims

## 2016-11-12 NOTE — Discharge Instructions (Signed)
Resume Metformin after 2 days. Keep right wrist elevated on pillow above the heart for today.  Watch right wrist for evidence of bleeding or hematoma.. If bleeding or hematoma noted, hold pressure over the site for at least 15 minutes and notify the physician.  No blending or flexing of the wrist--no lifting for the remainder of the day or for 2 weeks after your procedure. Notify the physician for evidence of infection or if you get a temperature.

## 2016-11-12 NOTE — H&P (View-Only) (Signed)
  Cardiology Office Note   Date:  11/05/2016   ID:  Leslie Sims, DOB 09/22/1951, MRN [id number]  PCP:  SMITH,KRISTI, MD  Cardiologist:   Enzley Kitchens, MD   Chief Complaint  Patient presents with  . other    6 month f/u c/o chest pain, depression and sob. Meds reviewed verbally with pt.      History of Present Illness: Leslie Sims is a 65 y.o. male who presents for a follow-up visit regarding coronary artery disease and mild ischemic cardiomyopathy. He had previous anterior myocardial infarction in December of 2010. His LAD was occluded and RCA had a 99% stenosis. He had an angioplasty and drug-eluting stent placement to both LAD and RCA at that time. 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 Cardiac catheterization in 02/14  showed widely patent stents in the LAD and RCA with no evidence of other obstructive disease. Ejection fraction was 40% with mildly elevated left ventricular end-diastolic pressure.  Carotid Doppler in November, 2015 showed only mild bilateral carotid stenosis which remained stable in 2017. He reports significant worsening of exertional dyspnea and chest pain over the last few weeks and he had one emergency room visit for this. He feels extremely anxious about his health issues and he is extremely worried that there is something wrong with previous stents. He reports dyspnea with minimal activities with associated chest tightness. No orthopnea, PND or leg edema. He is time to quit smoking but has not been successful.    Past Medical History:  Diagnosis Date  . Allergic rhinitis   . Anxiety   . Atherosclerosis   . Back pain   . Bronchitis    09-14-11  . CARDIOMYOPATHY 03/24/2010   did not tolerate Toprol and Lisinopril.   . Chest pain   . Chronic airway obstruction, not elsewhere classified   . Colon polyps 08/2012   colonoscopy; multiple colon polyps; repeat colonoscopy in one year.  . COPD (chronic obstructive pulmonary  disease) (HCC)   . Coronary artery disease 11/2009   Anterior MI with late presentation 11/2009. Cath showed a 100% proximal LAD occlusion, 99% mid RCA. Had a PCI and 2 DES to both LAD and RCA. EF was 35%. Nuclear stress test 05/13: prior anterior/inferior infarcts without ischemic, EF 43%, cardiac cath in 02/14: Widely patent stents with no other obstructive disease. EF 40%   . Depression   . Diabetes mellitus without complication (HCC)   . GERD (gastroesophageal reflux disease)   . Headache(784.0)   . Helicobacter pylori (H. pylori)   . Hemorrhoids   . Hernia    inguinal  . Hyperlipidemia   . Hypertension   . Insomnia   . MI (myocardial infarction)   . Myalgia   . Peptic ulcer   . Shortness of breath dyspnea   . Status post dilation of esophageal narrowing 2000  . Thyroid dysfunction   . Tinea pedis   . Wears dentures    partial upper    Past Surgical History:  Procedure Laterality Date  . Admission  12/20/2012   COPD exacerbation.  [facility].  . CARDIAC CATHETERIZATION    . CARDIAC CATHETERIZATION  02/05/13   [facility]  . CARDIAC CATHETERIZATION  10/14   [facility] : patent stents with no change in anatomy. EF: 40$  . COLONOSCOPY    . COLONOSCOPY WITH PROPOFOL N/A 05/18/2016   Procedure: COLONOSCOPY WITH PROPOFOL;  Surgeon: Darren Wohl, MD;  Location: [facility] ENDOSCOPY;  Service: Endoscopy;  Laterality:   N/A;  . CORONARY ANGIOPLASTY WITH STENT PLACEMENT  09/2009   LAD 3.0 X23 mm Xience DES, RCA: 4.0 X 15 mm Xience DES  . ESOPHAGEAL DILATION    . ESOPHAGOGASTRODUODENOSCOPY  2008  . ESOPHAGOGASTRODUODENOSCOPY  08/20/2012  . HERNIA REPAIR  07/20/2012   L inguinal hernia repair  . PENILE PROSTHESIS IMPLANT       Current Outpatient Prescriptions  Medication Sig Dispense Refill  . albuterol (PROVENTIL) (2.5 MG/3ML) 0.083% nebulizer solution Take 3 mLs (2.5 mg total) by nebulization every 6 (six) hours as needed for wheezing or shortness of breath. 360 vial 4  . aspirin 81 MG tablet Take 81 mg by  mouth daily.      . atorvastatin (LIPITOR) 40 MG tablet Take 1 tablet (40 mg total) by mouth daily. 90 tablet 3  . blood glucose meter kit and supplies KIT Dispense based on patient and insurance preference. Use up to four times daily as directed. (FOR ICD-9 250.00, 250.01). 1 each 0  . CORTIFOAM 10 % rectal foam PLACE ONE APPLICATION RECTALLY TWICE DAILY 15 g 5  . fluticasone (FLONASE) 50 MCG/ACT nasal spray Place 1 spray into both nostrils 2 (two) times daily. 16 g 0  . Fluticasone-Salmeterol (ADVAIR DISKUS) 500-50 MCG/DOSE AEPB Inhale 1 puff into the lungs 2 (two) times daily. 180 each 3  . ipratropium-albuterol (DUONEB) 0.5-2.5 (3) MG/3ML SOLN Take 3 mLs by nebulization every 6 (six) hours as needed. 360 mL 1  . levofloxacin (LEVAQUIN) 500 MG tablet Take 1 tablet (500 mg total) by mouth daily. 7 tablet 0  . LORazepam (ATIVAN) 0.5 MG tablet Take 1 tablet (0.5 mg total) by mouth 2 (two) times daily as needed for anxiety. 60 tablet 2  . losartan (COZAAR) 25 MG tablet Take 1 tablet (25 mg total) by mouth daily. 90 tablet 3  . metFORMIN (GLUCOPHAGE) 500 MG tablet TAKE 1 TABLET DAILY AFTER SUPPER 90 tablet 3  . Multiple Vitamin (MULTIVITAMIN) capsule Take 1 capsule by mouth daily.    . nicotine (NICODERM CQ - DOSED IN MG/24 HOURS) 21 mg/24hr patch Place 1 patch (21 mg total) onto the skin daily. 28 patch 12  . NICOTROL 10 MG inhaler INHALE CONTENTS OF 1 CARTRIDGE (CONTINUOUS PUFFING) INTO THE LUNGS AS NEEDED FOR SMOKING CESSATION ( 168 each 4  . nitroGLYCERIN (NITROSTAT) 0.4 MG SL tablet Place 1 tablet (0.4 mg total) under the tongue every 5 (five) minutes as needed. 25 tablet 3  . sertraline (ZOLOFT) 100 MG tablet Take 1-2 tablets (100-200 mg total) by mouth daily. 180 tablet 1  . sodium chloride (OCEAN) 0.65 % SOLN nasal spray Place 2 sprays into both nostrils every 2 (two) hours while awake.  0  . sucralfate (CARAFATE) 1 GM/10ML suspension TAKE 10ML( 2 TEASPOONSFUL) BY MOUTH FOUR TIMES DAILY 3600  mL 1  . tiotropium (SPIRIVA HANDIHALER) 18 MCG inhalation capsule Place 1 capsule (18 mcg total) into inhaler and inhale daily. 90 capsule 3  . VENTOLIN HFA 108 (90 Base) MCG/ACT inhaler USE 2 INHALATIONS INTO  THE LUNGS  EVERY 6 HOURS AS NEEDED FOR WHEEZING 54 g 1   No current facility-administered medications for this visit.     Allergies:   Wellbutrin [bupropion]    Social History:  The patient  reports that he has been smoking Cigarettes.  He has been smoking about 0.00 packs per day for the past 42.00 years. He has never used smokeless tobacco. He reports that he does not drink alcohol or use   drugs.   Family History:  The patient's family history includes Alcohol abuse in his sister and sister; COPD in his sister; COPD (age of onset: 58) in his mother; Coronary artery disease (age of onset: 64) in his father; Diabetes in his brother and father; Heart attack in his brother and father; Heart attack (age of onset: 55) in his sister; Heart disease in his brother and father; Hypertension in his brother.    ROS:  Please see the history of present illness.   Otherwise, review of systems are positive for none.   All other systems are reviewed and negative.    PHYSICAL EXAM: VS:  BP 110/64 (BP Location: Left Arm, Patient Position: Sitting, Cuff Size: Normal)   Pulse 98   Ht 5' 10" (1.778 m)   Wt 147 lb (66.7 kg)   BMI 21.09 kg/m  , BMI Body mass index is 21.09 kg/m. GEN: Well nourished, well developed, in no acute distress  HEENT: normal  Neck: no JVD, carotid bruits, or masses Cardiac: RRR; no murmurs, rubs, or gallops,no edema  Respiratory:  clear to auscultation bilaterally, normal work of breathing GI: soft, nontender, nondistended, + BS MS: no deformity or atrophy  Skin: warm and dry, no rash Neuro:  Strength and sensation are intact Psych: euthymic mood, full affect   EKG:  EKG is ordered today. The ekg ordered today demonstrates normal sinus rhythm with old anteroseptal  infarct.   Recent Labs: 10/13/2016: ALT 18 10/19/2016: BUN 11; Creatinine, Ser 0.82; Hemoglobin 15.2; Platelets 259; Potassium 3.4; Sodium 137    Lipid Panel    Component Value Date/Time   CHOL 123 (L) 08/24/2016 1344   TRIG 126 08/24/2016 1344   TRIG 65 01/15/2010   HDL 68 08/24/2016 1344   CHOLHDL 1.8 08/24/2016 1344   VLDL 25 08/24/2016 1344   LDLCALC 30 08/24/2016 1344      Wt Readings from Last 3 Encounters:  11/05/16 147 lb (66.7 kg)  10/27/16 147 lb 12.8 oz (67 kg)  10/19/16 148 lb (67.1 kg)        ASSESSMENT AND PLAN:  1.  Coronary artery disease involving native coronary arteries with unstable angina:  The patient reports significant worsening of exertional chest pain and shortness of breath which is worrisome given his cardiac history. His EKG is consistent with prior anterior infarct. Anxiety might be contributing but that's a diagnosis of exclusion. I gave him the option of either stress testing or proceeding directly with cardiac catheterization. The patient was not able to exercise on a treadmill previously and given his advanced lung disease, stress testing is going to have decreased accuracy. Thus, I think it's more reasonable to proceed directly with cardiac catheterization which was discussed with him today. Risks and benefits were explained and we'll plan on doing this next week.  2. Ischemic cardiomyopathy: Most recent ejection fraction was 40%. He did not tolerate treatment with a beta blocker and currently on low-dose losartan.  3. Hyperlipidemia: Continue treatment with atorvastatin.  4. Tobacco use: Unfortunately, he is not able to quit smoking.    Disposition:   FU with me in 1 months  Signed,  Jozlynn Plaia, MD  11/05/2016 2:28 PM    [facility]  

## 2016-11-14 ENCOUNTER — Telehealth: Payer: Self-pay | Admitting: Cardiology

## 2016-11-14 NOTE — Telephone Encounter (Signed)
Received call from patient. Underwent LHC on 11/24 with Dr. Fletcher Anon that noted patent stents in the LAD/RCA with no evidence of obstructive CAD. Pt reports he was having intermittent chest pain prior to the cath, which is somewhat improved. States since having the [place] he has intermittent burning sensation in the left upper chest. No chest pain, n/v, or diaphoresis. No pleuritic pain. States the burning does really hurt, but it more aggravating. He has follow up in the office in late December. I advised that if he is to develop worsening symptoms, to go to the ED for further evaluation. Otherwise, call the office in the am for sooner appt.

## 2016-11-15 NOTE — Telephone Encounter (Signed)
Pt reports some improvement in burning sensation that he felts after 11/24 LHC. He is agreeable to f/u 12/4 @ 2:15pm w/Dr. Fletcher Anon. He understands if sx worsen, he can proceed to the ED for further evaluation.

## 2016-11-15 NOTE — Telephone Encounter (Signed)
Patient calling to see if tingling and dull pain left side of chest is normal for s/p cath on Friday.  Please call.

## 2016-11-18 ENCOUNTER — Encounter: Payer: Self-pay | Admitting: Family Medicine

## 2016-11-18 ENCOUNTER — Ambulatory Visit (INDEPENDENT_AMBULATORY_CARE_PROVIDER_SITE_OTHER): Payer: BLUE CROSS/BLUE SHIELD | Admitting: Family Medicine

## 2016-11-18 VITALS — BP 100/62 | HR 86 | Temp 98.1°F | Ht 68.25 in | Wt 144.0 lb

## 2016-11-18 DIAGNOSIS — I2510 Atherosclerotic heart disease of native coronary artery without angina pectoris: Secondary | ICD-10-CM | POA: Diagnosis not present

## 2016-11-18 DIAGNOSIS — F341 Dysthymic disorder: Secondary | ICD-10-CM

## 2016-11-18 DIAGNOSIS — J302 Other seasonal allergic rhinitis: Secondary | ICD-10-CM | POA: Diagnosis not present

## 2016-11-18 DIAGNOSIS — J3089 Other allergic rhinitis: Secondary | ICD-10-CM | POA: Diagnosis not present

## 2016-11-18 DIAGNOSIS — J449 Chronic obstructive pulmonary disease, unspecified: Secondary | ICD-10-CM

## 2016-11-18 MED ORDER — IPRATROPIUM-ALBUTEROL 0.5-2.5 (3) MG/3ML IN SOLN: 3.0000 mL | Freq: Four times a day (QID) | RESPIRATORY_TRACT | 3 refills | Status: DC | PRN

## 2016-11-18 MED ORDER — LORAZEPAM 0.5 MG PO TABS: 0.5000 mg | ORAL_TABLET | Freq: Three times a day (TID) | ORAL | 2 refills | Status: DC | PRN

## 2016-11-18 MED ORDER — SALINE SPRAY 0.65 % NA SOLN: 2.0000 | NASAL | 5 refills | Status: DC

## 2016-11-18 MED ORDER — PAROXETINE HCL ER 12.5 MG PO TB24: 12.5000 mg | ORAL_TABLET | Freq: Every day | ORAL | 1 refills | Status: DC

## 2016-11-18 NOTE — Patient Instructions (Addendum)
  1.  Start Paxil at bedtime 2.  Decrease Zoloft to one tablet every morning. 3.  Increase Lorazepam to three times daily   IF you received an x-ray today, you will receive an invoice from [facility]. Please contact [facility] at [phone] with questions or concerns regarding your invoice.   IF you received labwork today, you will receive an invoice from [employer]. Please contact [facility] at [phone] with questions or concerns regarding your invoice.   Our billing staff will not be able to assist you with questions regarding bills from these companies.  You will be contacted with the lab results as soon as they are available. The fastest way to get your results is to activate your My Chart account. Instructions are located on the last page of this paperwork. If you have not heard from [place] regarding the results in 2 weeks, please contact this office.

## 2016-11-18 NOTE — Progress Notes (Signed)
Subjective:    Patient ID: Leslie Sims, male    DOB: [birth year], 65 y.o.   MRN: [id number]  11/18/2016  Anxiety (recently had heart cath - worry); Cough (very little product); Shortness of Breath (more than normal); and Depression (screening positive at triage)   HPI This 65 y.o. male presents for evaluation of   Hacking cough and shortness of breath since heart catherization.  Worried about reaction to dye with heart cath.  Small amount of mucous.  Coughing in the mornings and at night.   No head congestion.    Excessive worrying; no improvement.  Taking Klonopin every morning; taking Zoloft every morning; at 3:00pm takes Klonopin; then takes another 6:00-9:00 Zoloft.  No energy to do any thing. "I'm going crazy".  Increased Zoloft three weeks ago.  No better at all.  Has been taking Zoloft a long time.  Gets panic attack several times.  Panic attack duration 5 minutes. When gets panic attack, breathing gets worse.  May be intolerant to Effexor. Scared to leave the house; scared to death to go but felt good to get out.  Was going to go the grocery store but no good. Has half roof on.    Wellbutrin makes feel funny.  No Lexapro.  No Prozac.  No SI.  Tired all the time; lost buddy; Audry Pili started working with [employer]; now does not have anyone to hang out with.     Review of Systems  Constitutional: Positive for fatigue. Negative for activity change, appetite change, chills, diaphoresis and fever.  HENT: Negative for congestion, postnasal drip, rhinorrhea, sinus pain, sinus pressure and sore throat.   Respiratory: Positive for cough and shortness of breath.   Cardiovascular: Negative for chest pain, palpitations and leg swelling.  Gastrointestinal: Negative for abdominal pain, diarrhea, nausea and vomiting.  Endocrine: Negative for cold intolerance, heat intolerance, polydipsia, polyphagia and polyuria.  Skin: Negative for color change, rash and wound.  Neurological: Negative for  dizziness, tremors, seizures, syncope, facial asymmetry, speech difficulty, weakness, light-headedness, numbness and headaches.  Psychiatric/Behavioral: Positive for dysphoric mood. Negative for sleep disturbance. The patient is nervous/anxious.     Past Medical History:  Diagnosis Date  . Allergic rhinitis   . Anxiety   . Atherosclerosis   . Back pain   . Bronchitis    09-14-11  . CARDIOMYOPATHY 03/24/2010   did not tolerate Toprol and Lisinopril.   . Chest pain   . Chronic airway obstruction, not elsewhere classified   . Colon polyps 08/2012   colonoscopy; multiple colon polyps; repeat colonoscopy in one year.  [name] COPD (chronic obstructive pulmonary disease) ([place])   . Coronary artery disease 11/2009   Anterior MI with late presentation 11/2009. Cath showed a 100% proximal LAD occlusion, 99% mid RCA. Had a PCI and 2 DES to both LAD and RCA. EF was 35%. Nuclear stress test 05/13: prior anterior/inferior infarcts without ischemic, EF 43%, cardiac cath in 02/14: Widely patent stents with no other obstructive disease. EF 40%   . Depression   . Diabetes mellitus without complication ([place])   . GERD (gastroesophageal reflux disease)   . Headache(784.0)   . Helicobacter pylori (H. pylori)   . Hemorrhoids   . Hernia    inguinal  . Hyperlipidemia   . Hypertension   . Insomnia   . MI (myocardial infarction)   . Myalgia   . Peptic ulcer   . Shortness of breath dyspnea   . Status post dilation of esophageal narrowing 2000  .  Thyroid dysfunction   . Tinea pedis   . Wears dentures    partial upper   Past Surgical History:  Procedure Laterality Date  . Admission  12/20/2012   COPD exacerbation.  [place].  [name] CARDIAC CATHETERIZATION    . CARDIAC CATHETERIZATION  02/05/13   [facility]  . CARDIAC CATHETERIZATION  10/14   [facility] : patent stents with no change in anatomy. EF: 40$  . CARDIAC CATHETERIZATION Left 11/12/2016   Procedure: Left Heart Cath and Coronary Angiography;  Surgeon: Wellington Hampshire,  MD;  Location: [facility];  Service: Cardiovascular;  Laterality: Left;  . COLONOSCOPY    . COLONOSCOPY WITH PROPOFOL N/A 05/18/2016   Procedure: COLONOSCOPY WITH PROPOFOL;  Surgeon: Lucilla Lame, MD;  Location: [facility] ENDOSCOPY;  Service: Endoscopy;  Laterality: N/A;  . CORONARY ANGIOPLASTY WITH STENT PLACEMENT  09/2009   LAD 3.0 X23 mm Xience DES, RCA: 4.0 X 15 mm Xience DES  . ESOPHAGEAL DILATION    . ESOPHAGOGASTRODUODENOSCOPY  2008  . ESOPHAGOGASTRODUODENOSCOPY  08/20/2012  . HERNIA REPAIR  07/20/2012   L inguinal hernia repair  . PENILE PROSTHESIS IMPLANT     Allergies  Allergen Reactions  . Wellbutrin [Bupropion] Other (See Comments)    Unknown/"made me feel real funny"    Social History   Social History  . Marital status: Married    Spouse name: N/A  . Number of children: 5  . Years of education: N/A   Occupational History  . 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 Smoking status: Current Every Day Smoker    Packs/day: 0.00    Years: 42.00    Types: Cigarettes  . Smokeless tobacco: Never Used     Comment: smoking 15-18 cigarettes daily  . Alcohol use No  . Drug use: No  . Sexual activity: Yes   Other Topics Concern  . Not on file   Social History Narrative   Marital status: married x 32 years; happily married.      Children: 4 children;  1 stepchild and 15 grandchildren; no great grandchildren.      Lives: with wife; 2 dogs, 1 cat.      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      Tobacco: 1 ppd x 45 years      Alcohol: none; previous alcoholism      Drugs: none      Exercise: no formal exercise; physically demanding job; owns horses.      Seatbelts:  100%      Guns: one loaded unsecured gun in the home.      Sunscreen: none   Family History  Problem Relation Age of Onset  . Heart attack Brother     Brother #1  . Diabetes Brother     brother #2  . Hypertension Brother     #3  . Coronary  artery disease Father 50    deceased  . Heart attack Father   . Diabetes Father   . Heart disease Father   . COPD Mother 11    deceased  . Heart attack Sister 46    acute-cocaine abuse/deceased  . Alcohol abuse Sister     polysubstance abuse  . COPD Sister   . Alcohol abuse Sister     polysubstance abuse  . Heart disease Brother     AMI x multiple       Objective:    BP 100/62 (BP Location: Left Arm,  Patient Position: Sitting, Cuff Size: Large)   Pulse 86   Temp 98.1 F (36.7 C) (Oral)   Ht 5' 8.25" (1.734 m)   Wt 144 lb (65.3 kg)   SpO2 93%   BMI 21.74 kg/m  Physical Exam  Constitutional: He is oriented to person, place, and time. He appears well-developed and well-nourished. No distress.  HENT:  Head: Normocephalic and atraumatic.  Right Ear: External ear normal.  Left Ear: External ear normal.  Nose: Nose normal.  Mouth/Throat: Oropharynx is clear and moist.  Eyes: Conjunctivae and EOM are normal. Pupils are equal, round, and reactive to light.  Neck: Normal range of motion. Neck supple. Carotid bruit is not present. No thyromegaly present.  Cardiovascular: Normal rate, regular rhythm, normal heart sounds and intact distal pulses.  Exam reveals no gallop and no friction rub.   No murmur heard. Pulmonary/Chest: Effort normal and breath sounds normal. No accessory muscle usage. No tachypnea. No respiratory distress. He has no wheezes. He has no rhonchi. He has no rales.  No increased work of breathing; speaking complete sentences.  Abdominal: Soft. Bowel sounds are normal. He exhibits no distension and no mass. There is no tenderness. There is no rebound and no guarding.  Lymphadenopathy:    He has no cervical adenopathy.  Neurological: He is alert and oriented to person, place, and time. No cranial nerve deficit. He exhibits normal muscle tone. Coordination normal.  Skin: Skin is warm and dry. No rash noted. He is not diaphoretic.  Psychiatric: He has a normal mood  and affect. His behavior is normal. Judgment and thought content normal.  Nursing note and vitals reviewed.  Results for orders placed or performed in visit on 0000000  Basic Metabolic Panel (BMET)  Result Value Ref Range   Glucose 148 (H) 65 - 99 mg/dL   BUN 10 8 - 27 mg/dL   Creatinine, Ser 0.82 0.76 - 1.27 mg/dL   GFR calc non Af Amer 94 >59 mL/min/1.73   GFR calc Af Amer 109 >59 mL/min/1.73   BUN/Creatinine Ratio 12 10 - 24   Sodium 142 134 - 144 mmol/L   Potassium 4.2 3.5 - 5.2 mmol/L   Chloride 100 96 - 106 mmol/L   CO2 25 18 - 29 mmol/L   Calcium 9.6 8.6 - 10.2 mg/dL  CBC  Result Value Ref Range   WBC 6.7 3.4 - 10.8 x10E3/uL   RBC 5.04 4.14 - 5.80 x10E6/uL   Hemoglobin 15.3 12.6 - 17.7 g/dL   Hematocrit 44.4 37.5 - 51.0 %   MCV 88 79 - 97 fL   MCH 30.4 26.6 - 33.0 pg   MCHC 34.5 31.5 - 35.7 g/dL   RDW 13.0 12.3 - 15.4 %   Platelets 217 150 - 379 x10E3/uL  INR/PT  Result Value Ref Range   INR 1.0 0.8 - 1.2   Prothrombin Time 10.8 9.1 - 12.0 sec   Depression screen [facility] 2/9 11/18/2016 10/27/2016 10/13/2016 09/17/2016 08/24/2016  Decreased Interest 3 2 2  0 0  Down, Depressed, Hopeless 3 2 2  0 0  PHQ - 2 Score 6 4 4  0 0  Altered sleeping 2 3 3  - -  Tired, decreased energy 2 3 3  - -  Change in appetite 2 2 0 - -  Feeling bad or failure about yourself  3 1 2  - -  Trouble concentrating 3 2 2  - -  Moving slowly or fidgety/restless 0 1 2 - -  Suicidal thoughts 0 - 0 - -  PHQ-9 Score 18 16 16  - -  Difficult doing work/chores - - Somewhat difficult - -   GAD 7 : Generalized Anxiety Score 10/27/2016  Nervous, Anxious, on Edge 2  Control/stop worrying 3  Worry too much - different things 3  Trouble relaxing 3  Restless 2  Easily annoyed or irritable 3  Afraid - awful might happen 3  Total GAD 7 Score 19  Anxiety Difficulty Somewhat difficult        Assessment & Plan:   1. DEPRESSION/ANXIETY   2. Atherosclerosis of native coronary artery of native heart without  angina pectoris   3. COPD, very severe ([place])   4. Seasonal and perennial allergic rhinitis    -uncontrolled anxiety/depression; suffering with excessive worry over overall health and well-being; s/p cardiac catheterization without reassurance; no acute COPD exacerbation.  S/p CXR that was negative; s/p ED visit in past month. -rx for Paxil CR 12.5mg  daily one daily; increase Lorazepam to tid scheduled; decrease Zoloft to one daily for the next month; will increase Paxil to 25mg  in one month and decrease Zoloft dose.  -has follow-up with cardiology next week. -refill of nebulizer solution provided; recommend follow-up with pulmonology.  No orders of the defined types were placed in this encounter.  Meds ordered this encounter  Medications  . PARoxetine (PAXIL-CR) 12.5 MG 24 hr tablet    Sig: Take 1 tablet (12.5 mg total) by mouth daily.    Dispense:  30 tablet    Refill:  1  . LORazepam (ATIVAN) 0.5 MG tablet    Sig: Take 1 tablet (0.5 mg total) by mouth every 8 (eight) hours as needed for anxiety.    Dispense:  90 tablet    Refill:  2    This prescription was filled on 10/05/2016. Any refills authorized will be placed on file.  . sodium chloride (OCEAN) 0.65 % SOLN nasal spray    Sig: Place 2 sprays into both nostrils every 2 (two) hours while awake.    Dispense:  88 mL    Refill:  5  . DISCONTD: ipratropium-albuterol (DUONEB) 0.5-2.5 (3) MG/3ML SOLN    Sig: Take 3 mLs by nebulization every 6 (six) hours as needed.    Dispense:  480 mL    Refill:  3  . ipratropium-albuterol (DUONEB) 0.5-2.5 (3) MG/3ML SOLN    Sig: Take 3 mLs by nebulization every 6 (six) hours as needed.    Dispense:  480 mL    Refill:  3    Return in about 4 weeks (around 12/16/2016) for recheck anxiety.   Tip Atienza Elayne Guerin, M.D. [facility] [address] [place], [place]  [phone] phone [phone] fax

## 2016-11-22 ENCOUNTER — Encounter: Payer: Self-pay | Admitting: Cardiovascular Disease

## 2016-11-22 ENCOUNTER — Ambulatory Visit (INDEPENDENT_AMBULATORY_CARE_PROVIDER_SITE_OTHER): Payer: BLUE CROSS/BLUE SHIELD | Admitting: Cardiovascular Disease

## 2016-11-22 VITALS — BP 100/64 | HR 93 | Ht 70.0 in | Wt 148.5 lb

## 2016-11-22 DIAGNOSIS — Z720 Tobacco use: Secondary | ICD-10-CM | POA: Diagnosis not present

## 2016-11-22 DIAGNOSIS — I5022 Chronic systolic (congestive) heart failure: Secondary | ICD-10-CM | POA: Diagnosis not present

## 2016-11-22 DIAGNOSIS — I2510 Atherosclerotic heart disease of native coronary artery without angina pectoris: Secondary | ICD-10-CM | POA: Diagnosis not present

## 2016-11-22 DIAGNOSIS — I10 Essential (primary) hypertension: Secondary | ICD-10-CM

## 2016-11-22 MED ORDER — FUROSEMIDE 20 MG PO TABS: 20.0000 mg | ORAL_TABLET | Freq: Every day | ORAL | 3 refills | Status: DC

## 2016-11-22 MED ORDER — ISOSORBIDE MONONITRATE ER 30 MG PO TB24: 30.0000 mg | ORAL_TABLET | Freq: Every day | ORAL | 3 refills | Status: DC

## 2016-11-22 NOTE — Progress Notes (Signed)
Cardiology Office Note   Date:  11/22/2016   ID:  Leslie Sims, DOB 02-11-1951, MRN [id number]  PCP:  [name], MD  Cardiologist:   Kathlyn Sacramento, MD   Chief Complaint  Patient presents with  . other     F/u Cath on 11/12/16. Pt c/o more than normal sob and burning in left chest region. Reviewed meds with pt verbally.      History of Present Illness: Leslie Sims is a 65 y.o. male who presents for a follow-up visit regarding coronary artery disease and ischemic cardiomyopathy. He had previous anterior myocardial infarction in December of 2010. His LAD was occluded and RCA had a 99% stenosis. He had an angioplasty and drug-eluting stent placement to both LAD and RCA at that time. 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 Cardiac catheterization in 02/14  showed widely patent stents in the LAD and RCA with no evidence of other obstructive disease. Ejection fraction was 40% with mildly elevated left ventricular end-diastolic pressure.  Carotid Doppler in November, 2015 showed only mild bilateral carotid stenosis which remained stable in 2017. He was seen recently for significant worsening of exertional dyspnea and chest pain . He has been dealing with significant anxiety symptoms. I proceeded with cardiac catheterization which showed patent LAD and RCA stents with no evidence of obstructive disease. Ejection fraction was 35-40% with severe anterior and apical hypokinesis. Left ventricular end-diastolic pressure was moderately elevated. The patient continues to feel the same. He appears to be extremely anxious. He complains of left-sided chest burning and continued shortness of breath. He has no energy throughout the day.  Past Medical History:  Diagnosis Date  . Allergic rhinitis   . Anxiety   . Atherosclerosis   . Back pain   . Bronchitis    09-14-11  . CARDIOMYOPATHY 03/24/2010   did not tolerate Toprol and Lisinopril.   . Chest pain   . Chronic  airway obstruction, not elsewhere classified   . Colon polyps 08/2012   colonoscopy; multiple colon polyps; repeat colonoscopy in one year.  [name] COPD (chronic obstructive pulmonary disease) ([place])   . Coronary artery disease 11/2009   Anterior MI with late presentation 11/2009. Cath showed a 100% proximal LAD occlusion, 99% mid RCA. Had a PCI and 2 DES to both LAD and RCA. EF was 35%. Nuclear stress test 05/13: prior anterior/inferior infarcts without ischemic, EF 43%, cardiac cath in 02/14: Widely patent stents with no other obstructive disease. EF 40%   . Depression   . Diabetes mellitus without complication ([place])   . GERD (gastroesophageal reflux disease)   . Headache(784.0)   . Helicobacter pylori (H. pylori)   . Hemorrhoids   . Hernia    inguinal  . Hyperlipidemia   . Hypertension   . Insomnia   . MI (myocardial infarction)   . Myalgia   . Peptic ulcer   . Shortness of breath dyspnea   . Status post dilation of esophageal narrowing 2000  . Thyroid dysfunction   . Tinea pedis   . Wears dentures    partial upper    Past Surgical History:  Procedure Laterality Date  . Admission  12/20/2012   COPD exacerbation.  [facility].  [name] CARDIAC CATHETERIZATION    . CARDIAC CATHETERIZATION  02/05/13   [facility]  . CARDIAC CATHETERIZATION  10/14   [facility] : patent stents with no change in anatomy. EF: 40$  . CARDIAC CATHETERIZATION Left 11/12/2016   Procedure: Left Heart  Cath and Coronary Angiography;  Surgeon: Wellington Hampshire, MD;  Location: [facility];  Service: Cardiovascular;  Laterality: Left;  . COLONOSCOPY    . COLONOSCOPY WITH PROPOFOL N/A 05/18/2016   Procedure: COLONOSCOPY WITH PROPOFOL;  Surgeon: Lucilla Lame, MD;  Location: [facility] ENDOSCOPY;  Service: Endoscopy;  Laterality: N/A;  . CORONARY ANGIOPLASTY WITH STENT PLACEMENT  09/2009   LAD 3.0 X23 mm Xience DES, RCA: 4.0 X 15 mm Xience DES  . ESOPHAGEAL DILATION    . ESOPHAGOGASTRODUODENOSCOPY  2008  . ESOPHAGOGASTRODUODENOSCOPY   08/20/2012  . HERNIA REPAIR  07/20/2012   L inguinal hernia repair  . PENILE PROSTHESIS IMPLANT       Current Outpatient Prescriptions  Medication Sig Dispense Refill  . albuterol (PROVENTIL) (2.5 MG/3ML) 0.083% nebulizer solution Take 3 mLs (2.5 mg total) by nebulization every 6 (six) hours as needed for wheezing or shortness of breath. 360 vial 4  . aspirin 81 MG tablet Take 81 mg by mouth daily.      [name] atorvastatin (LIPITOR) 40 MG tablet Take 1 tablet (40 mg total) by mouth daily. 90 tablet 3  . CORTIFOAM 10 % rectal foam PLACE ONE APPLICATION RECTALLY TWICE DAILY 15 g 5  . fluticasone (FLONASE) 50 MCG/ACT nasal spray Place 1 spray into both nostrils 2 (two) times daily. 16 g 0  . Fluticasone-Salmeterol (ADVAIR DISKUS) 500-50 MCG/DOSE AEPB Inhale 1 puff into the lungs 2 (two) times daily. 180 each 3  . ipratropium-albuterol (DUONEB) 0.5-2.5 (3) MG/3ML SOLN Take 3 mLs by nebulization every 6 (six) hours as needed. 480 mL 3  . LORazepam (ATIVAN) 0.5 MG tablet Take 1 tablet (0.5 mg total) by mouth every 8 (eight) hours as needed for anxiety. 90 tablet 2  . losartan (COZAAR) 25 MG tablet Take 1 tablet (25 mg total) by mouth daily. 90 tablet 3  . metFORMIN (GLUCOPHAGE) 500 MG tablet TAKE 1 TABLET DAILY AFTER SUPPER 90 tablet 3  . Multiple Vitamin (MULTIVITAMIN) capsule Take 1 capsule by mouth daily.    . nicotine (NICODERM CQ - DOSED IN MG/24 HOURS) 21 mg/24hr patch Place 1 patch (21 mg total) onto the skin daily. 28 patch 12  . NICOTROL 10 MG inhaler INHALE CONTENTS OF 1 CARTRIDGE (CONTINUOUS PUFFING) INTO THE LUNGS AS NEEDED FOR SMOKING CESSATION ( 168 each 4  . nitroGLYCERIN (NITROSTAT) 0.4 MG SL tablet Place 1 tablet (0.4 mg total) under the tongue every 5 (five) minutes as needed. 25 tablet 3  . PARoxetine (PAXIL-CR) 12.5 MG 24 hr tablet Take 1 tablet (12.5 mg total) by mouth daily. 30 tablet 1  . sertraline (ZOLOFT) 100 MG tablet Take 1-2 tablets (100-200 mg total) by mouth daily. 180 tablet  1  . sodium chloride (OCEAN) 0.65 % SOLN nasal spray Place 2 sprays into both nostrils every 2 (two) hours while awake. 88 mL 5  . sucralfate (CARAFATE) 1 GM/10ML suspension TAKE 10ML( 2 TEASPOONSFUL) BY MOUTH FOUR TIMES DAILY 3600 mL 1  . tiotropium (SPIRIVA HANDIHALER) 18 MCG inhalation capsule Place 1 capsule (18 mcg total) into inhaler and inhale daily. 90 capsule 3  . VENTOLIN HFA 108 (90 Base) MCG/ACT inhaler USE 2 INHALATIONS INTO  THE LUNGS  EVERY 6 HOURS AS NEEDED FOR WHEEZING 54 g 1   No current facility-administered medications for this visit.     Allergies:   Wellbutrin [bupropion]    Social History:  The patient  reports that he has been smoking Cigarettes.  He has been smoking  about 0.00 packs per day for the past 42.00 years. He has never used smokeless tobacco. He reports that he does not drink alcohol or use drugs.   Family History:  The patient's family history includes Alcohol abuse in his sister and sister; COPD in his sister; COPD (age of onset: 4) in his mother; Coronary artery disease (age of onset: 38) in his father; Diabetes in his brother and father; Heart attack in his brother and father; Heart attack (age of onset: 71) in his sister; Heart disease in his brother and father; Hypertension in his brother.    ROS:  Please see the history of present illness.   Otherwise, review of systems are positive for none.   All other systems are reviewed and negative.    PHYSICAL EXAM: VS:  BP 100/64 (BP Location: Left Arm, Patient Position: Sitting, Cuff Size: Normal)   Pulse 93   Ht 5\' 10"  (1.778 m)   Wt 148 lb 8 oz (67.4 kg)   BMI 21.31 kg/m  , BMI Body mass index is 21.31 kg/m. GEN: Well nourished, well developed, in no acute distress  HEENT: normal  Neck: no JVD, carotid bruits, or masses Cardiac: RRR; no murmurs, rubs, or gallops,no edema  Respiratory:  clear to auscultation bilaterally With diminished breath sounds, normal work of breathing GI: soft, nontender,  nondistended, + BS MS: no deformity or atrophy  Skin: warm and dry, no rash Neuro:  Strength and sensation are intact Psych: euthymic mood, full affect Right radial pulse is normal with no hematoma.  EKG:  EKG is ordered today. The ekg ordered today demonstrates normal sinus rhythm with old anteroseptal infarct.   Recent Labs: 10/13/2016: ALT 18 10/19/2016: Hemoglobin 15.2 11/05/2016: BUN 10; Creatinine, Ser 0.82; Platelets 217; Potassium 4.2; Sodium 142    Lipid Panel    Component Value Date/Time   CHOL 123 (L) 08/24/2016 1344   TRIG 126 08/24/2016 1344   TRIG 65 01/15/2010   HDL 68 08/24/2016 1344   CHOLHDL 1.8 08/24/2016 1344   VLDL 25 08/24/2016 1344   LDLCALC 30 08/24/2016 1344      Wt Readings from Last 3 Encounters:  11/22/16 148 lb 8 oz (67.4 kg)  11/18/16 144 lb (65.3 kg)  11/12/16 148 lb (67.1 kg)        ASSESSMENT AND PLAN:  1.  Coronary artery disease involving native coronary arteries with Other forms of angina:   Recent cardiac catheterization was overall reassuring but the patient continues to have chest pain and shortness of breath which very well could be due to uncontrolled anxiety. Microvascular disease cannot be completely excluded and thus I elected to add small dose Imdur 30 mg once daily.  2. Ischemic cardiomyopathy: Most recent ejection fraction was 35-40. He did not tolerate treatment with a beta blocker and currently on low-dose losartan. His BP is too low.   His left ventricular end-diastolic pressure was moderately elevated. I elected to add small dose furosemide 20 mg once daily. Check basic metabolic profile in one week.  3. Hyperlipidemia: Continue treatment with atorvastatin.  4. Tobacco use: I again discussed with him the importance of smoking cessation and he is going to try to quit at the same time with his wife but current anxiety symptoms do not help with this.    Disposition:   FU with me in 1 months  Signed,  Kathlyn Sacramento,  MD  11/22/2016 2:35 PM    [facility]

## 2016-11-22 NOTE — Patient Instructions (Signed)
Medication Instructions:  Your physician has recommended you make the following change in your medication:  START taking lasix 20mg  once daily START taking Imdur 30mg  once daily   Labwork: BMET in one week  Testing/Procedures: none  Follow-Up: Your physician recommends that you schedule a follow-up appointment in: one month with Dr. Fletcher Anon.    Any Other Special Instructions Will Be Listed Below (If Applicable).     If you need a refill on your cardiac medications before your next appointment, please call your pharmacy.

## 2016-11-23 ENCOUNTER — Telehealth: Payer: Self-pay | Admitting: Cardiovascular Disease

## 2016-11-23 NOTE — Telephone Encounter (Signed)
Pt calling stating he would like to cancel the refills that we send to [facility]  Would like to make sure For he picked it up at his local pharmacy.  Please advise

## 2016-11-23 NOTE — Telephone Encounter (Signed)
Lmovm letting pt know that we didn't have any medications sent to [facility].

## 2016-11-23 NOTE — Telephone Encounter (Signed)
I don't see any medications that we have recently sent in to [facility].

## 2016-11-24 ENCOUNTER — Ambulatory Visit: Payer: BLUE CROSS/BLUE SHIELD | Admitting: Family Medicine

## 2016-11-26 ENCOUNTER — Encounter: Payer: Self-pay | Admitting: Family Medicine

## 2016-11-29 ENCOUNTER — Telehealth: Payer: Self-pay | Admitting: Cardiovascular Disease

## 2016-11-29 ENCOUNTER — Other Ambulatory Visit: Payer: BLUE CROSS/BLUE SHIELD

## 2016-11-29 ENCOUNTER — Other Ambulatory Visit: Payer: Self-pay

## 2016-11-29 ENCOUNTER — Other Ambulatory Visit
Admission: RE | Admit: 2016-11-29 | Discharge: 2016-11-29 | Disposition: A | Discharge status: Home / Self Care | Payer: BLUE CROSS/BLUE SHIELD | Source: Ambulatory Visit | Attending: Cardiovascular Disease | Admitting: Cardiovascular Disease

## 2016-11-29 DIAGNOSIS — I5022 Chronic systolic (congestive) heart failure: Secondary | ICD-10-CM

## 2016-11-29 LAB — BASIC METABOLIC PANEL
Anion gap: 8 (ref 5–15)
BUN: 8 mg/dL (ref 6–20)
CALCIUM: 9.4 mg/dL (ref 8.9–10.3)
CHLORIDE: 101 mmol/L (ref 101–111)
CO2: 31 mmol/L (ref 22–32)
CREATININE: 0.87 mg/dL (ref 0.61–1.24)
GFR calc Af Amer: >60 mL/min (ref >60)
GFR calc non Af Amer: >60 mL/min (ref >60)
Glucose, Bld: 124 mg/dL — ABNORMAL HIGH (ref 65–99)
Potassium: 3.5 mmol/L (ref 3.5–5.1)
SODIUM: 140 mmol/L (ref 135–145)

## 2016-11-29 NOTE — Telephone Encounter (Signed)
Pt reports "feeling like crap with no energy". He feels more SOB, difficulty breathing. Oxygen level 95% on room air. Nebulizer treatments have not provided much relief. He has been experiencing a headache, more chest discomfort, more burning on the left-side of his chest and discomfort/dull ache in the center of chest and soreness in the the center of his abdomen since starting imdur and lasix on Dec 4. The headache has improved slightly but is still a dull pain.  Pt admits to having anxiety and depression which could be causing his sx. He has not noticed increased output since starting lasix 20mg .  He has not taken imdur today and was scheduled to have labs this AM in our office but was told they would need to be rescheduled d/t smoke-smell in the lobby. Advised pt to proceed to the [facility] for outpatient labs today. He will hold imdur today to see if sx improve and will await lab results and further recommendations.

## 2016-11-29 NOTE — Telephone Encounter (Signed)
Pt states he is having difficutly with Isosorbide. And Furosemide  States he is having more SOB, very weak, and more discomfort. STates has more chest discomfort than when he started on this medication. Please call.

## 2016-11-30 ENCOUNTER — Ambulatory Visit: Payer: BLUE CROSS/BLUE SHIELD | Admitting: Family Medicine

## 2016-12-01 ENCOUNTER — Emergency Department
Admission: EM | Admit: 2016-12-01 | Discharge: 2016-12-01 | Disposition: A | Discharge status: Home / Self Care | Payer: BLUE CROSS/BLUE SHIELD | Attending: Emergency Medicine | Admitting: Emergency Medicine

## 2016-12-01 ENCOUNTER — Encounter: Payer: Self-pay | Admitting: Emergency Medicine

## 2016-12-01 ENCOUNTER — Emergency Department: Payer: BLUE CROSS/BLUE SHIELD

## 2016-12-01 DIAGNOSIS — I5022 Chronic systolic (congestive) heart failure: Secondary | ICD-10-CM | POA: Diagnosis not present

## 2016-12-01 DIAGNOSIS — I2510 Atherosclerotic heart disease of native coronary artery without angina pectoris: Secondary | ICD-10-CM | POA: Diagnosis not present

## 2016-12-01 DIAGNOSIS — F17210 Nicotine dependence, cigarettes, uncomplicated: Secondary | ICD-10-CM | POA: Diagnosis not present

## 2016-12-01 DIAGNOSIS — E119 Type 2 diabetes mellitus without complications: Secondary | ICD-10-CM | POA: Insufficient documentation

## 2016-12-01 DIAGNOSIS — Z7984 Long term (current) use of oral hypoglycemic drugs: Secondary | ICD-10-CM | POA: Diagnosis not present

## 2016-12-01 DIAGNOSIS — Z79899 Other long term (current) drug therapy: Secondary | ICD-10-CM | POA: Insufficient documentation

## 2016-12-01 DIAGNOSIS — R079 Chest pain, unspecified: Secondary | ICD-10-CM | POA: Diagnosis present

## 2016-12-01 DIAGNOSIS — I110 Hypertensive heart disease with heart failure: Secondary | ICD-10-CM | POA: Diagnosis not present

## 2016-12-01 DIAGNOSIS — Z7982 Long term (current) use of aspirin: Secondary | ICD-10-CM | POA: Diagnosis not present

## 2016-12-01 DIAGNOSIS — J441 Chronic obstructive pulmonary disease with (acute) exacerbation: Secondary | ICD-10-CM | POA: Diagnosis not present

## 2016-12-01 LAB — CBC
HEMATOCRIT: 45.2 % (ref 40.0–52.0)
Hemoglobin: 15.1 g/dL (ref 13.0–18.0)
MCH: 30.1 pg (ref 26.0–34.0)
MCHC: 33.5 g/dL (ref 32.0–36.0)
MCV: 89.9 fL (ref 80.0–100.0)
Platelets: 248 10*3/uL (ref 150–440)
RBC: 5.03 MIL/uL (ref 4.40–5.90)
RDW: 13.5 % (ref 11.5–14.5)
WBC: 9.1 10*3/uL (ref 3.8–10.6)

## 2016-12-01 LAB — BASIC METABOLIC PANEL
Anion gap: 7 (ref 5–15)
BUN: 12 mg/dL (ref 6–20)
CHLORIDE: 100 mmol/L — AB (ref 101–111)
CO2: 31 mmol/L (ref 22–32)
Calcium: 9.3 mg/dL (ref 8.9–10.3)
Creatinine, Ser: 0.83 mg/dL (ref 0.61–1.24)
GFR calc Af Amer: >60 mL/min (ref >60)
GFR calc non Af Amer: >60 mL/min (ref >60)
GLUCOSE: 99 mg/dL (ref 65–99)
POTASSIUM: 3.9 mmol/L (ref 3.5–5.1)
Sodium: 138 mmol/L (ref 135–145)

## 2016-12-01 LAB — TROPONIN I: Troponin I: <0.03 ng/mL (ref <0.03)

## 2016-12-01 LAB — FIBRIN DERIVATIVES D-DIMER (ARMC ONLY): Fibrin derivatives D-dimer (ARMC): 376 (ref 0–499)

## 2016-12-01 IMAGING — CR DG CHEST 2V
2 series · 2 of 2 positions shown · non-contrast
Comparison: [DATE].  [DATE].

CLINICAL DATA: Chest pain.  Cough.

EXAM:
CHEST  2 VIEW

[chest pa]
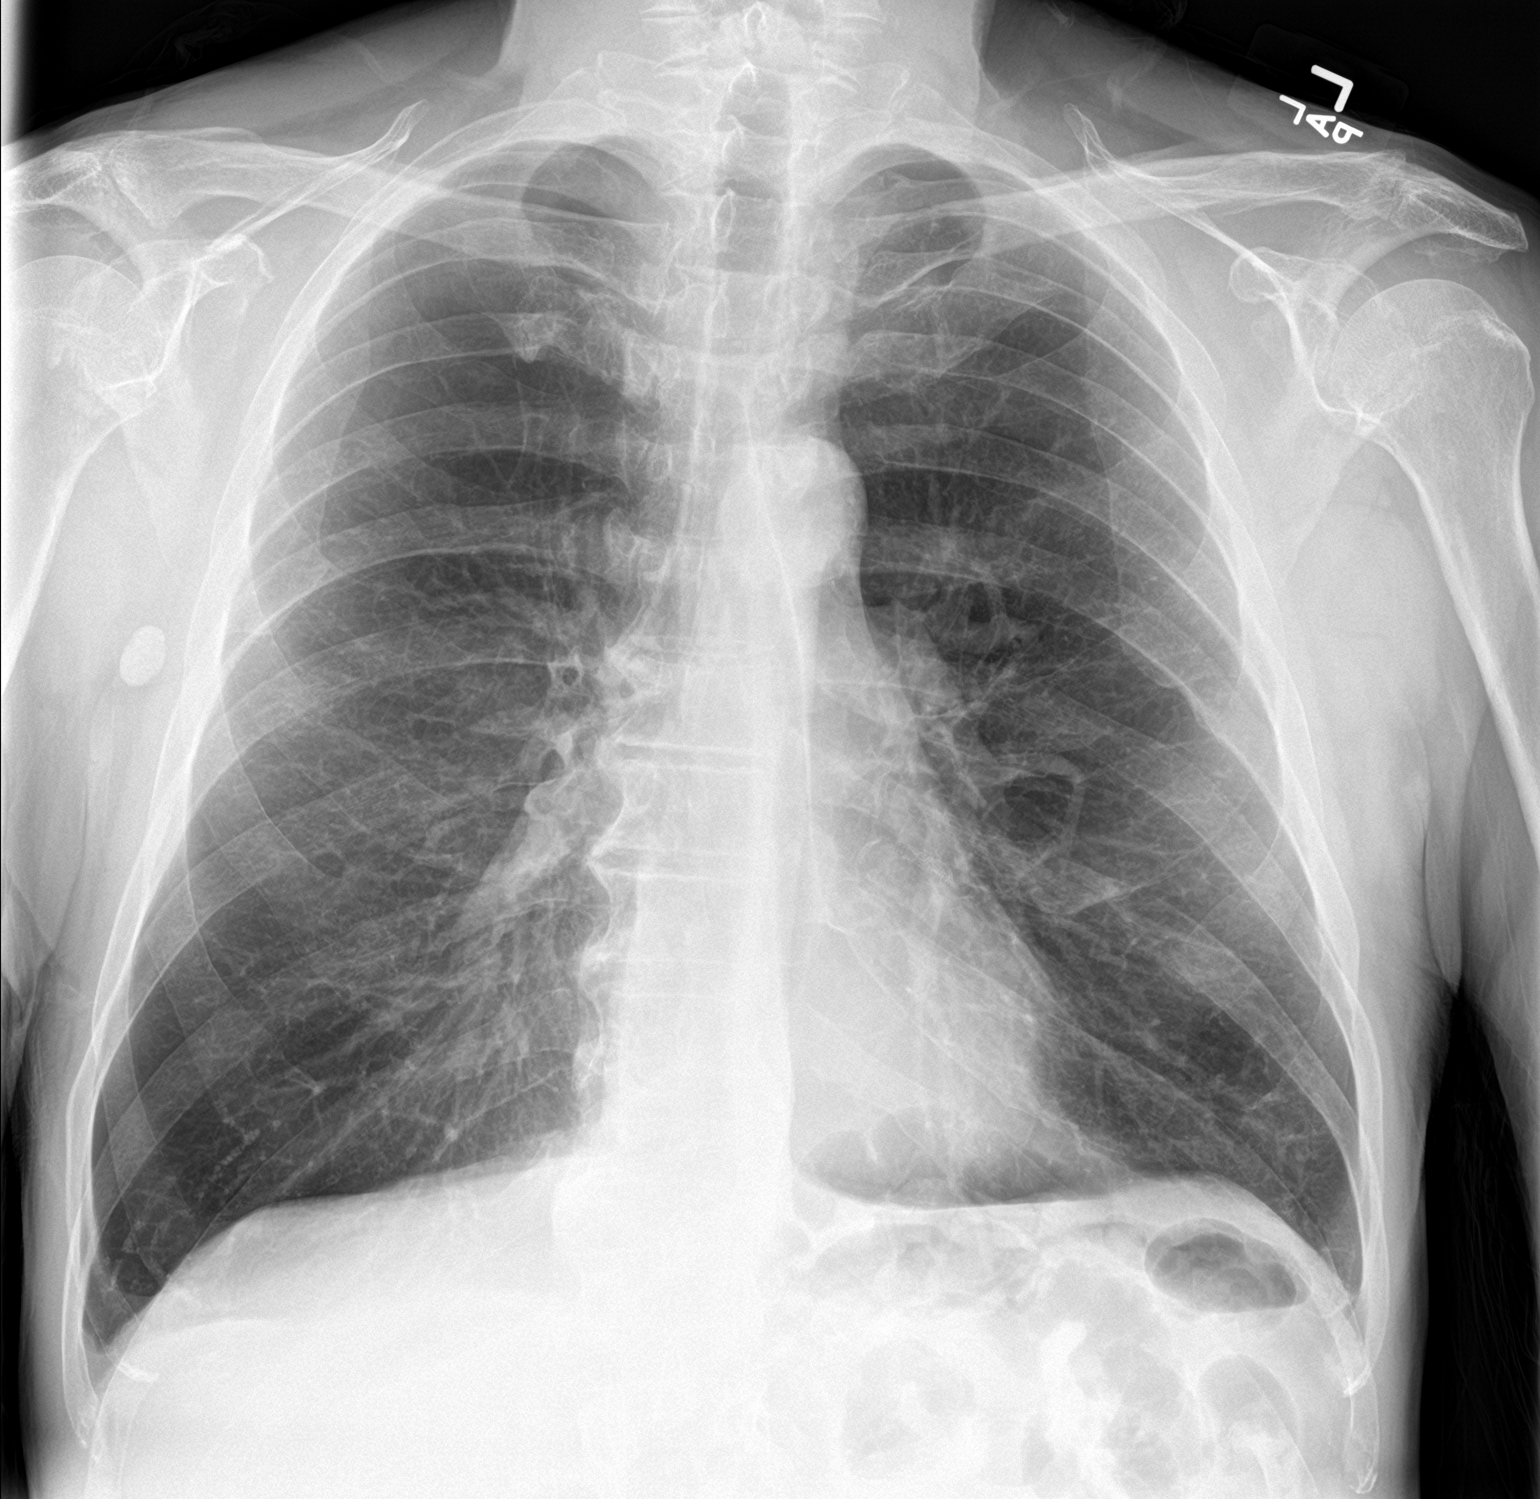

[chest lat]
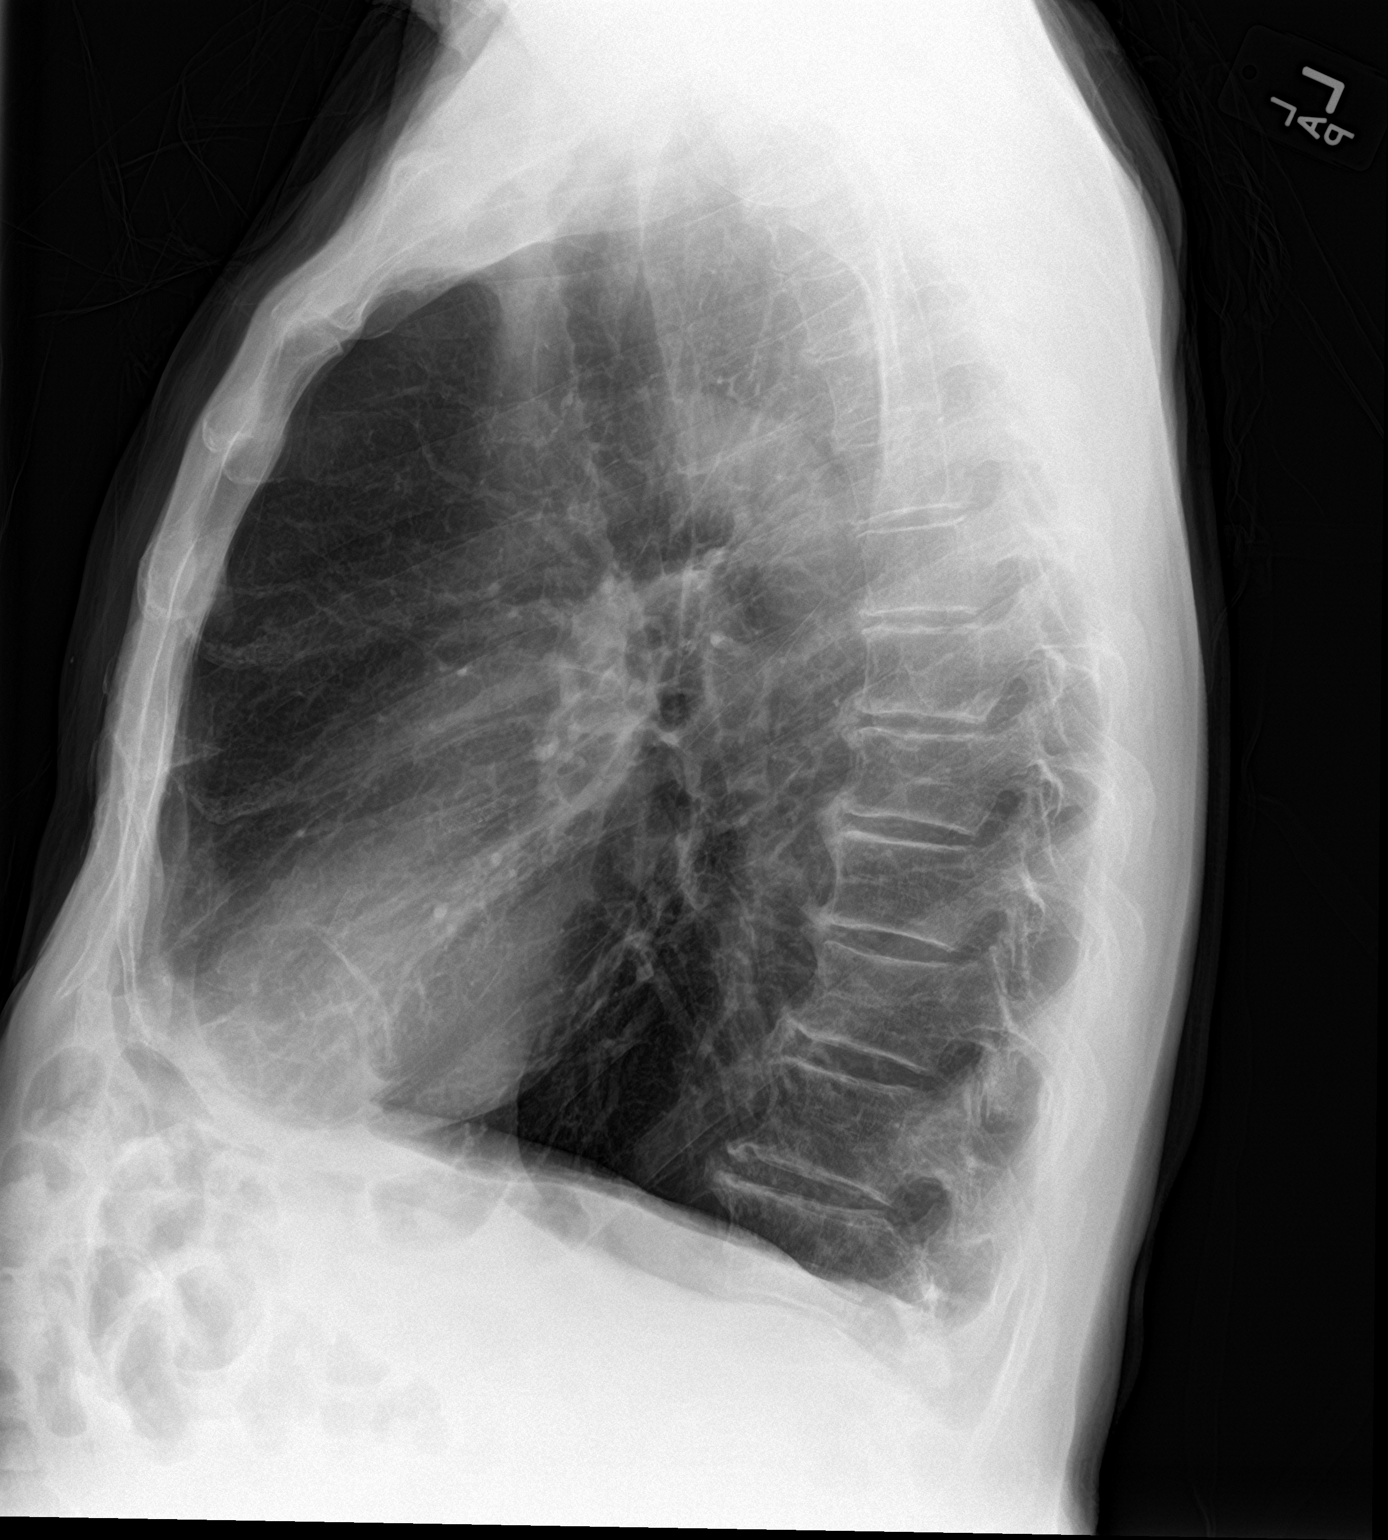

[2 of 2 positions shown; findings below may reference images not displayed]

FINDINGS: Mediastinum and hilar structures are normal. Bullous COPD . Lungs
are clear. No pleural effusion or pneumothorax. Heart size normal.
Degenerative changes thoracic spine. Calcified right axillary
density again noted consistent calcified lymph node. This is stable.
IMPRESSION: 1. Bullous COPD.

2.  No acute abnormality.

## 2016-12-01 MED ORDER — GI COCKTAIL ~~LOC~~
30.0000 mL | Freq: Once | ORAL | Status: AC
  Administered 2016-12-01: 30 mL via ORAL
  Filled 2016-12-01: qty 30

## 2016-12-01 NOTE — Discharge Instructions (Signed)
Your heart blood work looks normal today your catheterization was normal recently. Please follow-up with your family doctor return for any further problems.

## 2016-12-01 NOTE — ED Provider Notes (Signed)
[facility] Emergency Department Provider Note   ____________________________________________   First MD Initiated Contact with Patient 12/01/16 1713     (approximate)  I have reviewed the triage vital signs and the nursing notes.   HISTORY  Chief Complaint Chest Pain    HPI Leslie Sims is a 65 y.o. male she reports she has burning in his chest for the last 2 weeks constantly. He says he feels weak and shortness of short of breath constantly. Nothing he does changes these symptoms at all he has been moving hay bales without any difficulty. He says he has a history of anxiety. One time he had anxiety very bad for some time and really didn't hardly even get out of bed for several weeks. His doctor has been changing his medicines but he has not taken any medicine. He called his cardiologist today and described his sensations and he was told to come to the ER for check.  Past Medical History:  Diagnosis Date  . Allergic rhinitis   . Anxiety   . Atherosclerosis   . Back pain   . Bronchitis    09-14-11  . CARDIOMYOPATHY 03/24/2010   did not tolerate Toprol and Lisinopril.   . Chest pain   . Chronic airway obstruction, not elsewhere classified   . Colon polyps 08/2012   colonoscopy; multiple colon polyps; repeat colonoscopy in one year.  [name] COPD (chronic obstructive pulmonary disease) ([place])   . Coronary artery disease 11/2009   Anterior MI with late presentation 11/2009. Cath showed a 100% proximal LAD occlusion, 99% mid RCA. Had a PCI and 2 DES to both LAD and RCA. EF was 35%. Nuclear stress test 05/13: prior anterior/inferior infarcts without ischemic, EF 43%, cardiac cath in 02/14: Widely patent stents with no other obstructive disease. EF 40%   . Depression   . Diabetes mellitus without complication ([place])   . GERD (gastroesophageal reflux disease)   . Headache(784.0)   . Helicobacter pylori (H. pylori)   . Hemorrhoids   . Hernia    inguinal  .  Hyperlipidemia   . Hypertension   . Insomnia   . MI (myocardial infarction)   . Myalgia   . Peptic ulcer   . Shortness of breath dyspnea   . Status post dilation of esophageal narrowing 2000  . Thyroid dysfunction   . Tinea pedis   . Wears dentures    partial upper    Patient Active Problem List   Diagnosis Date Noted  . Unstable angina ([place])   . Diabetes ([place]) 10/25/2016  . Benign neoplasm of ascending colon   . Benign neoplasm of descending colon   . Benign neoplasm of sigmoid colon   . Personal history of colonic polyps   . Pulmonary nodules 01/05/2015  . Bruit of left carotid artery 11/04/2014  . Sleep disorder 07/18/2013  . Seasonal and perennial allergic rhinitis 05/29/2013  . COPD exacerbation ([place]) 01/02/2013  . Bradycardia 04/20/2011  . SMOKER 07/30/2010  . CAD, NATIVE VESSEL 04/23/2010  . COLONIC POLYPS 03/24/2010  . Hyperlipidemia 03/24/2010  . DEPRESSION/ANXIETY 03/24/2010  . Chronic systolic heart failure ([place]) 03/24/2010  . 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 GERD 03/24/2010  . Essential hypertension 11/21/2009    Past Surgical History:  Procedure Laterality Date  . Admission  12/20/2012   COPD exacerbation.  [place].  [name] CARDIAC CATHETERIZATION    . CARDIAC CATHETERIZATION  02/05/13   [facility]  . CARDIAC CATHETERIZATION  10/14  [facility] : patent stents with no change in anatomy. EF: 40$  . CARDIAC CATHETERIZATION Left 11/12/2016   Procedure: Left Heart Cath and Coronary Angiography;  Surgeon: Wellington Hampshire, MD;  Location: [facility];  Service: Cardiovascular;  Laterality: Left;  . COLONOSCOPY    . COLONOSCOPY WITH PROPOFOL N/A 05/18/2016   Procedure: COLONOSCOPY WITH PROPOFOL;  Surgeon: Lucilla Lame, MD;  Location: [facility] ENDOSCOPY;  Service: Endoscopy;  Laterality: N/A;  . CORONARY ANGIOPLASTY WITH STENT PLACEMENT  09/2009   LAD 3.0 X23 mm Xience DES, RCA: 4.0 X 15 mm Xience DES  . ESOPHAGEAL DILATION    . ESOPHAGOGASTRODUODENOSCOPY  2008  .  ESOPHAGOGASTRODUODENOSCOPY  08/20/2012  . HERNIA REPAIR  07/20/2012   L inguinal hernia repair  . PENILE PROSTHESIS IMPLANT      Prior to Admission medications   Medication Sig Start Date End Date Taking? Authorizing Provider  albuterol (PROVENTIL) (2.5 MG/3ML) 0.083% nebulizer solution Take 3 mLs (2.5 mg total) by nebulization every 6 (six) hours as needed for wheezing or shortness of breath. 12/08/15   [name], MD  aspirin 81 MG tablet Take 81 mg by mouth daily.      Historical Provider, MD  atorvastatin (LIPITOR) 40 MG tablet Take 1 tablet (40 mg total) by mouth daily. 10/13/16   [name], MD  CORTIFOAM 10 % rectal foam PLACE ONE APPLICATION RECTALLY TWICE DAILY 10/17/15   [name], MD  fluticasone [facility]) 50 MCG/ACT nasal spray Place 1 spray into both nostrils 2 (two) times daily. 02/28/16   Olen Cordial, NP  Fluticasone-Salmeterol (ADVAIR DISKUS) 500-50 MCG/DOSE AEPB Inhale 1 puff into the lungs 2 (two) times daily. 07/27/16   [name], MD  furosemide (LASIX) 20 MG tablet Take 1 tablet (20 mg total) by mouth daily. 11/22/16 02/20/17  Wellington Hampshire, MD  ipratropium-albuterol (DUONEB) 0.5-2.5 (3) MG/3ML SOLN Take 3 mLs by nebulization every 6 (six) hours as needed. 11/18/16   [name], MD  isosorbide mononitrate (IMDUR) 30 MG 24 hr tablet Take 1 tablet (30 mg total) by mouth daily. 11/22/16 02/20/17  Wellington Hampshire, MD  LORazepam (ATIVAN) 0.5 MG tablet Take 1 tablet (0.5 mg total) by mouth every 8 (eight) hours as needed for anxiety. 11/18/16   [name], MD  losartan (COZAAR) 25 MG tablet Take 1 tablet (25 mg total) by mouth daily. 02/04/16   Minna Merritts, MD  metFORMIN (GLUCOPHAGE) 500 MG tablet TAKE 1 TABLET DAILY AFTER SUPPER 10/13/16   [name], MD  Multiple Vitamin (MULTIVITAMIN) capsule Take 1 capsule by mouth daily.    Historical Provider, MD  nicotine (NICODERM CQ - DOSED IN MG/24 HOURS) 21 mg/24hr patch Place 1 patch (21 mg total) onto the  skin daily. 08/10/16   Wilhelmina Mcardle, MD  NICOTROL 10 MG inhaler INHALE CONTENTS OF 1 CARTRIDGE (CONTINUOUS PUFFING) INTO THE LUNGS AS NEEDED FOR SMOKING CESSATION ( 07/23/16   [name], MD  nitroGLYCERIN (NITROSTAT) 0.4 MG SL tablet Place 1 tablet (0.4 mg total) under the tongue every 5 (five) minutes as needed. 03/13/15   Minna Merritts, MD  PARoxetine (PAXIL-CR) 12.5 MG 24 hr tablet Take 1 tablet (12.5 mg total) by mouth daily. 11/18/16   [name], MD  sertraline (ZOLOFT) 100 MG tablet Take 1-2 tablets (100-200 mg total) by mouth daily. 08/10/16   [name], MD  sodium chloride (OCEAN) 0.65 % SOLN nasal spray Place  2 sprays into both nostrils every 2 (two) hours while awake. 11/18/16 07/27/17  [name], MD  sucralfate (CARAFATE) 1 GM/10ML suspension TAKE 10ML( 2 TEASPOONSFUL) BY MOUTH FOUR TIMES DAILY 01/21/15   [name], MD  tiotropium (SPIRIVA HANDIHALER) 18 MCG inhalation capsule Place 1 capsule (18 mcg total) into inhaler and inhale daily. 07/27/16   [name], MD  VENTOLIN HFA 108 [id number] Base) MCG/ACT inhaler USE 2 INHALATIONS INTO  THE LUNGS  EVERY 6 HOURS AS NEEDED FOR WHEEZING 04/26/16   [name], MD    Allergies Effexor xr [venlafaxine hcl er] and Wellbutrin [bupropion]  Family History  Problem Relation Age of Onset  . Heart attack Brother     Brother #1  . Diabetes Brother     brother #2  . Hypertension Brother     #3  . Coronary artery disease Father 60    deceased  . Heart attack Father   . Diabetes Father   . Heart disease Father   . COPD Mother 64    deceased  . Heart attack Sister 24    acute-cocaine abuse/deceased  . Alcohol abuse Sister     polysubstance abuse  . COPD Sister   . Alcohol abuse Sister     polysubstance abuse  . Heart disease Brother     AMI x multiple    Social History Social History  Substance Use Topics  . Smoking status: Current Every Day Smoker    Packs/day: 1.00    Years: 42.00    Types: Cigarettes  .  Smokeless tobacco: Never Used     Comment: smoking 15-18 cigarettes daily  . Alcohol use No    Review of Systems Constitutional: No fever/chills Eyes: No visual changes. ENT: No sore throat. Cardiovascular chest pain. RespiratorySee history of present illness Gastrointestinal: No abdominal pain.  No nausea, no vomiting.  No diarrhea.  No constipation. Genitourinary: Negative for dysuria. Musculoskeletal: Negative for back pain. Skin: Negative for rash.  10-point ROS otherwise negative.  ____________________________________________   PHYSICAL EXAM:  VITAL SIGNS: ED Triage Vitals [12/01/16 1558]  Enc Vitals Group     BP 125/73     Pulse Rate 81     Resp 20     Temp 97.4 F (36.3 C)     Temp Source Oral     SpO2 94 %     Weight 148 lb (67.1 kg)     Height 5\' 10"  (1.778 m)     Head Circumference      Peak Flow      Pain Score 3     Pain Loc      Pain Edu?      Excl. in [place]?     Constitutional: Alert and oriented. Well appearing and in no acute distress. Eyes: Conjunctivae are normal. PERRL. EOMI. Head: Atraumatic. Nose: No congestion/rhinnorhea. Mouth/Throat: Mucous membranes are moist.  Oropharynx non-erythematous. Neck: No stridor. Cardiovascular: Normal rate, regular rhythm. Grossly normal heart sounds.  Good peripheral circulation.There is a small amount of pain in his left chest on palpation it is similar to what he has had in her what he is having actually. Respiratory: Normal respiratory effort.  No retractions. Lungs CTAB. Gastrointestinal: Soft and nontender. No distention. No abdominal bruits. No CVA tenderness. Musculoskeletal: No lower extremity tenderness nor edema.  No joint effusions. Neurologic:  Normal speech and language. No gross focal neurologic deficits are appreciated. No gait instability. Skin:  Skin is warm, dry and intact.  No rash noted.   ____________________________________________   LABS (all labs ordered are listed, but only abnormal  results are displayed)  Labs Reviewed  BASIC METABOLIC PANEL - Abnormal; Notable for the following:       Result Value   Chloride 100 (*)    All other components within normal limits  CBC  TROPONIN I  FIBRIN DERIVATIVES D-DIMER ([facility] ONLY)   ____________________________________________  EKG  EKG read and interpreted by me shows no sinus rhythm rate of 79 left axis no acute ST-T wave changes computer is reading possible right ventricular conduction delay and he has left anterior hemiblock ____________________________________________  RADIOLOGY  Study Result   CLINICAL DATA:  Chest pain.  Cough.  EXAM: CHEST  2 VIEW  COMPARISON:  10/19/2016.  09/28/2015.  FINDINGS: Mediastinum and hilar structures are normal. Bullous COPD . Lungs are clear. No pleural effusion or pneumothorax. Heart size normal. Degenerative changes thoracic spine. Calcified right axillary density again noted consistent calcified lymph node. This is stable.  IMPRESSION: 1. Bullous COPD.  2.  No acute abnormality.   Electronically Signed   By: Marcello Moores  Register   On: 12/01/2016 17:15    Procedures   Left Heart Cath and Coronary Angiography  Conclusion     There is mild to moderate left ventricular systolic dysfunction.  The left ventricular ejection fraction is 35-45% by visual estimate.  LV end diastolic pressure is moderately elevated.  Prox RCA to Mid RCA lesion, 10 %stenosed.  Mid RCA lesion, 40 %stenosed.  Mid LAD lesion, 10 %stenosed.  Dist LAD lesion, 20 %stenosed.   1. Patent LAD and RCA stents with no evidence of obstructive coronary artery disease. 2. Mildly to moderately reduced LV systolic function with severe anterior and apical hypokinesis. Ejection fraction is 35-40%. 3. Moderately elevated left ventricular end-diastolic pressure.  Recommendations: Continue aggressive medical therapy. Consider treatment with a beta blocker if the patient can tolerate and  consider a small dose diuretic.     ____________________________________________   PROCEDURES  Procedure(s) performed:  Procedures  Critical Care performed:  ____________________________________________   INITIAL IMPRESSION / ASSESSMENT AND PLAN / ED COURSE  Pertinent labs & imaging results that were available during my care of the patient were reviewed by me and considered in my medical decision making (see chart for details).    Clinical Course    Patient says he was supposed to start a different anti- depressant but didn't. I will have him contact his regular doctor make sure it's not  ____________________________________________   FINAL CLINICAL IMPRESSION(S) / ED DIAGNOSES  Final diagnoses:  Nonspecific chest pain      NEW MEDICATIONS STARTED DURING THIS VISIT:  New Prescriptions   No medications on file     Note:  This document was prepared using Dragon voice recognition software and may include unintentional dictation errors.    Nena Polio, MD 12/01/16 [phone]

## 2016-12-01 NOTE — ED Triage Notes (Signed)
Pt from home with chest pain/burning x 2 weeks. He had cardiac cath and has had this pain since then. Pt states that he called his cardiologist's office and was told to come over to ED for blood work, etc.

## 2016-12-01 NOTE — Telephone Encounter (Signed)
Pt reports he has not taken any more imdur and headache has not improved. Discomfort in left shoulder, 4/10 left-hand side of chest burning sensation and dull pain. Denies nausea or diaphoresis.  SOB, reports oxygen level 97% on RA but feels difficulty breathing; gives out easily. He had a "fierce, continuous headache last night". Took 2 tylenol with little improvement but resolved w/sleep. Headache reoccured today; less severe than last night, now he reports it is often accompanied by burning sensation in the left side of his face.  States he had the heart catheterization 11/28 to relieve his anxiety regarding concern for heart issues. Recommendations were to continue aggressive medical therapy. Imdur and lasix added at 12/4 OV. He has noticed more urine output since adding lasix 20mg  daily.  He has decreased his anxiety medication to BID instead of TID. He was prescribed an additional anxiety medication (paxil) but he has not yet started taking it.  Pt sounds to be very anxious on the phone. I have advised him to proceed to the ER where he can be evaluated for his headache, breathing issues, and chest discomfort. He states he feels this is a good idea and will go there now.

## 2016-12-01 NOTE — Telephone Encounter (Signed)
Pt had labs drawn as instructed. Awaiting MD review. I called pt to get an update on sx.  Wife states he is not at home therefore, I called his cell phone. No answer; left message

## 2016-12-02 NOTE — Telephone Encounter (Signed)
His lung situation might be getting worse. He should follow-up with Dr. [name].

## 2016-12-03 NOTE — Telephone Encounter (Signed)
Left voicemail message for patient to call back.

## 2016-12-03 NOTE — Telephone Encounter (Signed)
Reviewed Dr. Tyrell Antonio recommendations that he follow up with Dr. [name] and he verbalized understanding and had no further questions at this time. He was appreciative for the call and let him know to call back if needed.

## 2016-12-10 ENCOUNTER — Ambulatory Visit: Payer: BLUE CROSS/BLUE SHIELD | Admitting: Cardiovascular Disease

## 2016-12-15 ENCOUNTER — Encounter: Payer: Self-pay | Admitting: Family Medicine

## 2016-12-15 ENCOUNTER — Ambulatory Visit (INDEPENDENT_AMBULATORY_CARE_PROVIDER_SITE_OTHER): Payer: BLUE CROSS/BLUE SHIELD | Admitting: Family Medicine

## 2016-12-15 VITALS — BP 114/66 | HR 91 | Temp 98.4°F | Resp 16 | Ht 70.0 in | Wt 146.0 lb

## 2016-12-15 DIAGNOSIS — I5022 Chronic systolic (congestive) heart failure: Secondary | ICD-10-CM

## 2016-12-15 DIAGNOSIS — J3089 Other allergic rhinitis: Secondary | ICD-10-CM

## 2016-12-15 DIAGNOSIS — I25118 Atherosclerotic heart disease of native coronary artery with other forms of angina pectoris: Secondary | ICD-10-CM | POA: Diagnosis not present

## 2016-12-15 DIAGNOSIS — J449 Chronic obstructive pulmonary disease, unspecified: Secondary | ICD-10-CM | POA: Diagnosis not present

## 2016-12-15 DIAGNOSIS — F341 Dysthymic disorder: Secondary | ICD-10-CM | POA: Diagnosis not present

## 2016-12-15 DIAGNOSIS — F17200 Nicotine dependence, unspecified, uncomplicated: Secondary | ICD-10-CM

## 2016-12-15 DIAGNOSIS — K219 Gastro-esophageal reflux disease without esophagitis: Secondary | ICD-10-CM

## 2016-12-15 DIAGNOSIS — J302 Other seasonal allergic rhinitis: Secondary | ICD-10-CM | POA: Diagnosis not present

## 2016-12-15 MED ORDER — PAROXETINE HCL ER 25 MG PO TB24: 25.0000 mg | ORAL_TABLET | Freq: Every day | ORAL | 1 refills | Status: DC

## 2016-12-15 NOTE — Patient Instructions (Addendum)
1. Decrease ZOLOFT to 1/2 tablet daily for two weeks and then STOP. 2.  Decrease nerve pill to 1 daily for two weeks and then only as needed. 3.  In two weeks, increase Paxil/Paroxetine to 25mg  daily.     IF you received an x-ray today, you will receive an invoice from [facility]. Please contact [facility] at [phone] with questions or concerns regarding your invoice.   IF you received labwork today, you will receive an invoice from [facility]. Please contact [facility] at [phone] with questions or concerns regarding your invoice.   Our billing staff will not be able to assist you with questions regarding bills from these companies.  You will be contacted with the lab results as soon as they are available. The fastest way to get your results is to activate your My Chart account. Instructions are located on the last page of this paperwork. If you have not heard from [place] regarding the results in 2 weeks, please contact this office.

## 2016-12-15 NOTE — Progress Notes (Signed)
Subjective:    Patient ID: Leslie Sims, male    DOB: Jun 03, 1951, 65 y.o.   MRN: [id number]  12/15/2016  Follow-up (depression anxiety )   HPI This 65 y.o. male presents for evaluation of anxiety and depression. Started Paxil 12.5mg  daily two weeks ago.  Thought had prescribed Effexor so did not start after last visit.  Followed up with Ar[facility] after last visit; added Imdur and Lasix; stopped Imdur due to lack of improvement; follow-up in one month. S/p ED visit on 12/01/16 for chest pain; s/p CXR negative.  No energy to do anything. Having a burning in chest.  SOB as well.  Taking Sertraline 100mg  daily.  Taking Ativan/Lorazepam 0.5mg  bid.  Excessive worry has improved.  Actually went to church on Friday prior to church; women make over pt; kisses pt and loves on him.  Best friend took job with [employer] for the holidays; may stay on after the holidays.  Pt has been down about not spending time with friend.    Immunization History  Administered Date(s) Administered  . Influenza Whole 09/19/2012  . Influenza,inj,Quad PF,36+ Mos 10/24/2013, 11/25/2014, 03/02/2016, 10/27/2016  . Pneumococcal Conjugate-13 11/25/2014  . Pneumococcal Polysaccharide-23 08/24/2016, 10/27/2016  . Pneumococcal-Unspecified 12/20/2008  . Tdap 12/20/2008   BP Readings from Last 3 Encounters:  12/28/16 104/60  12/15/16 114/66  12/01/16 126/84   Wt Readings from Last 3 Encounters:  12/28/16 146 lb 12 oz (66.6 kg)  12/15/16 146 lb (66.2 kg)  12/01/16 148 lb (67.1 kg)    Review of Systems  Constitutional: Negative for activity change, appetite change, chills, diaphoresis, fatigue and fever.  Respiratory: Positive for shortness of breath. Negative for cough.   Cardiovascular: Positive for chest pain. Negative for palpitations and leg swelling.  Gastrointestinal: Negative for abdominal pain, diarrhea, nausea and vomiting.  Endocrine: Negative for cold intolerance, heat intolerance, polydipsia, polyphagia and  polyuria.  Skin: Negative for color change, rash and wound.  Neurological: Negative for dizziness, tremors, seizures, syncope, facial asymmetry, speech difficulty, weakness, light-headedness, numbness and headaches.  Psychiatric/Behavioral: Positive for dysphoric mood. Negative for sleep disturbance. The patient is nervous/anxious.     Past Medical History:  Diagnosis Date  . Allergic rhinitis   . Anxiety   . Atherosclerosis   . Back pain   . Bronchitis    09-14-11  . CARDIOMYOPATHY 03/24/2010   did not tolerate Toprol and Lisinopril.   . Chest pain   . Chronic airway obstruction, not elsewhere classified   . Colon polyps 08/2012   colonoscopy; multiple colon polyps; repeat colonoscopy in one year.  [name] COPD (chronic obstructive pulmonary disease) ([place])   . Coronary artery disease 11/2009   Anterior MI with late presentation 11/2009. Cath showed a 100% proximal LAD occlusion, 99% mid RCA. Had a PCI and 2 DES to both LAD and RCA. EF was 35%. Nuclear stress test 05/13: prior anterior/inferior infarcts without ischemic, EF 43%, cardiac cath in 02/14: Widely patent stents with no other obstructive disease. EF 40%   . Depression   . Diabetes mellitus without complication ([place])   . GERD (gastroesophageal reflux disease)   . Headache(784.0)   . Helicobacter pylori (H. pylori)   . Hemorrhoids   . Hernia    inguinal  . Hyperlipidemia   . Hypertension   . Insomnia   . MI (myocardial infarction)   . Myalgia   . Peptic ulcer   . Shortness of breath dyspnea   . Status post dilation of esophageal narrowing 2000  .  Thyroid dysfunction   . Tinea pedis   . Wears dentures    partial upper   Past Surgical History:  Procedure Laterality Date  . Admission  12/20/2012   COPD exacerbation.  [place].  [name] CARDIAC CATHETERIZATION    . CARDIAC CATHETERIZATION  02/05/13   [facility]  . CARDIAC CATHETERIZATION  10/14   [facility] : patent stents with no change in anatomy. EF: 40$  . CARDIAC CATHETERIZATION Left  11/12/2016   Procedure: Left Heart Cath and Coronary Angiography;  Surgeon: Wellington Hampshire, MD;  Location: [facility];  Service: Cardiovascular;  Laterality: Left;  . COLONOSCOPY    . COLONOSCOPY WITH PROPOFOL N/A 05/18/2016   Procedure: COLONOSCOPY WITH PROPOFOL;  Surgeon: Lucilla Lame, MD;  Location: [facility];  Service: Endoscopy;  Laterality: N/A;  . CORONARY ANGIOPLASTY WITH STENT PLACEMENT  09/2009   LAD 3.0 X23 mm Xience DES, RCA: 4.0 X 15 mm Xience DES  . ESOPHAGEAL DILATION    . ESOPHAGOGASTRODUODENOSCOPY  2008  . ESOPHAGOGASTRODUODENOSCOPY  08/20/2012  . HERNIA REPAIR  07/20/2012   L inguinal hernia repair  . PENILE PROSTHESIS IMPLANT     Allergies  Allergen Reactions  . Effexor Xr [Venlafaxine Hcl Er]   . Wellbutrin [Bupropion] Other (See Comments)    Unknown/"made me feel real funny"    Social History   Social History  . Marital status: Married    Spouse name: N/A  . Number of children: 5  . Years of education: N/A   Occupational History  . 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 Smoking status: Current Every Day Smoker    Packs/day: 1.00    Years: 42.00    Types: Cigarettes  . Smokeless tobacco: Never Used     Comment: smoking 15-18 cigarettes daily  . Alcohol use No  . Drug use: No  . Sexual activity: Yes   Other Topics Concern  . Not on file   Social History Narrative   Marital status: married x 32 years; happily married.      Children: 4 children;  1 stepchild and 15 grandchildren; no great grandchildren.      Lives: with wife; 2 dogs, 1 cat.      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      Tobacco: 1 ppd x 45 years      Alcohol: none; previous alcoholism      Drugs: none      Exercise: no formal exercise; physically demanding job; owns horses.      Seatbelts:  100%      Guns: one loaded unsecured gun in the home.      Sunscreen: none   Family History  Problem Relation Age of  Onset  . Heart attack Brother     Brother #1  . Diabetes Brother     brother #2  . Hypertension Brother     #3  . Coronary artery disease Father 77    deceased  . Heart attack Father   . Diabetes Father   . Heart disease Father   . COPD Mother 48    deceased  . Heart attack Sister 20    acute-cocaine abuse/deceased  . Alcohol abuse Sister     polysubstance abuse  . COPD Sister   . Alcohol abuse Sister     polysubstance abuse  . Heart disease Brother     AMI x multiple       Objective:  BP 114/66 (BP Location: Right Arm, Patient Position: Sitting, Cuff Size: Small)   Pulse 91   Temp 98.4 F (36.9 C) (Oral)   Resp 16   Ht 5\' 10"  (1.778 m)   Wt 146 lb (66.2 kg)   SpO2 95%   BMI 20.95 kg/m  Physical Exam  Constitutional: He is oriented to person, place, and time. He appears well-developed and well-nourished. No distress.  HENT:  Head: Normocephalic and atraumatic.  Right Ear: External ear normal.  Left Ear: External ear normal.  Nose: Nose normal.  Mouth/Throat: Oropharynx is clear and moist.  Eyes: Conjunctivae and EOM are normal. Pupils are equal, round, and reactive to light.  Neck: Normal range of motion. Neck supple. Carotid bruit is not present. No thyromegaly present.  Cardiovascular: Normal rate, regular rhythm, normal heart sounds and intact distal pulses.  Exam reveals no gallop and no friction rub.   No murmur heard. Pulmonary/Chest: Effort normal and breath sounds normal. He has no wheezes. He has no rales.  Abdominal: Soft. Bowel sounds are normal. He exhibits no distension and no mass. There is no tenderness. There is no rebound and no guarding.  Lymphadenopathy:    He has no cervical adenopathy.  Neurological: He is alert and oriented to person, place, and time. No cranial nerve deficit.  Skin: Skin is warm and dry. No rash noted. He is not diaphoretic.  Psychiatric: He has a normal mood and affect. His behavior is normal. Thought content normal.    Nursing note and vitals reviewed.  Results for orders placed or performed during the hospital encounter of [id number]  Basic metabolic panel  Result Value Ref Range   Sodium 138 135 - 145 mmol/L   Potassium 3.9 3.5 - 5.1 mmol/L   Chloride 100 (L) 101 - 111 mmol/L   CO2 31 22 - 32 mmol/L   Glucose, Bld 99 65 - 99 mg/dL   BUN 12 6 - 20 mg/dL   Creatinine, Ser 0.83 0.61 - 1.24 mg/dL   Calcium 9.3 8.9 - 10.3 mg/dL   GFR calc non Af Amer >60 >60 mL/min   GFR calc Af Amer >60 >60 mL/min   Anion gap 7 5 - 15  CBC  Result Value Ref Range   WBC 9.1 3.8 - 10.6 K/uL   RBC 5.03 4.40 - 5.90 MIL/uL   Hemoglobin 15.1 13.0 - 18.0 g/dL   HCT 45.2 40.0 - 52.0 %   MCV 89.9 80.0 - 100.0 fL   MCH 30.1 26.0 - 34.0 pg   MCHC 33.5 32.0 - 36.0 g/dL   RDW 13.5 11.5 - 14.5 %   Platelets 248 150 - 440 K/uL  Troponin I  Result Value Ref Range   Troponin I <0.03 <0.03 ng/mL  Fibrin derivatives D-Dimer  Result Value Ref Range   Fibrin derivatives D-dimer ([facility]) 376 0 - 499       Assessment & Plan:   1. DEPRESSION/ANXIETY   2. COPD, very severe ([place])   3. Atherosclerosis of native coronary artery of native heart with other form of angina pectoris ([place])   4. Chronic systolic heart failure ([place])   5. Seasonal and perennial allergic rhinitis   6. Gastroesophageal reflux disease without esophagitis   7. SMOKER    -slight improvement in anxiety due to extensive cardiac evaluation recently; increase Paxil to 25mg   In upcoming two weeks.  Transition off of Sertraline.  Counseling provided; supportive of weaning benzo as tolerated as definitely contributing to fatigue. -s/p  cardiac catheterization by Dr. Fletcher Anon with stable CAD yet EF 35-40%.  No improvement with Imdur or Lasix therapies.   -highly encourage smoking cessation as I expect that COPD continues to progress. -reviewed recent work up during visit; pt reassured and doing better. -starting to increase social outings which is helpful mentally;  encourage ongoing activities.    No orders of the defined types were placed in this encounter.  Meds ordered this encounter  Medications  . PARoxetine (PAXIL-CR) 25 MG 24 hr tablet    Sig: Take 1 tablet (25 mg total) by mouth daily.    Dispense:  30 tablet    Refill:  1    Return in about 2 months (around 02/15/2017) for recheck.   Donato Studley Elayne Guerin, M.D. [facility] [address] [place], [place]  [address] [phone] phone [phone] fax

## 2016-12-28 ENCOUNTER — Ambulatory Visit (INDEPENDENT_AMBULATORY_CARE_PROVIDER_SITE_OTHER): Payer: BLUE CROSS/BLUE SHIELD | Admitting: Cardiovascular Disease

## 2016-12-28 ENCOUNTER — Encounter: Payer: Self-pay | Admitting: Cardiovascular Disease

## 2016-12-28 VITALS — BP 104/60 | HR 81 | Ht 70.0 in | Wt 146.8 lb

## 2016-12-28 DIAGNOSIS — E7800 Pure hypercholesterolemia, unspecified: Secondary | ICD-10-CM | POA: Diagnosis not present

## 2016-12-28 DIAGNOSIS — I25118 Atherosclerotic heart disease of native coronary artery with other forms of angina pectoris: Secondary | ICD-10-CM

## 2016-12-28 DIAGNOSIS — I10 Essential (primary) hypertension: Secondary | ICD-10-CM | POA: Diagnosis not present

## 2016-12-28 DIAGNOSIS — R0602 Shortness of breath: Secondary | ICD-10-CM | POA: Diagnosis not present

## 2016-12-28 DIAGNOSIS — Z720 Tobacco use: Secondary | ICD-10-CM

## 2016-12-28 MED ORDER — FUROSEMIDE 20 MG PO TABS: 20.0000 mg | ORAL_TABLET | Freq: Every day | ORAL | 1 refills | Status: DC

## 2016-12-28 NOTE — Patient Instructions (Signed)
Medication Instructions:  Your physician has recommended you make the following change in your medication:  STOP taking imdur   Labwork: none  Testing/Procedures: none  Follow-Up: Your physician wants you to follow-up in: 6 months with Dr. Fletcher Anon.  You will receive a reminder letter in the mail two months in advance. If you don't receive a letter, please call our office to schedule the follow-up appointment.  Any Other Special Instructions Will Be Listed Below (If Applicable).     If you need a refill on your cardiac medications before your next appointment, please call your pharmacy.

## 2016-12-28 NOTE — Progress Notes (Signed)
Cardiology Office Note   Date:  12/28/2016   ID:  Leslie Sims, DOB 1951/11/05, MRN [id number]  PCP:  [name], MD  Cardiologist:   Kathlyn Sacramento, MD   Chief Complaint  Patient presents with  . other    1 month follow up as well as [facility] from chest pain. Meds reviewed by the pt. verbally. Pt. c/o shortness of breath.       History of Present Illness: Leslie Sims is a 66 y.o. male who presents for a follow-up visit regarding coronary artery disease and ischemic cardiomyopathy. He had previous anterior myocardial infarction in December of 2010. His LAD was occluded and RCA had a 99% stenosis. He had an angioplasty and drug-eluting stent placement to both LAD and RCA at that time. 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 Cardiac catheterization in 02/14  showed widely patent stents in the LAD and RCA with no evidence of other obstructive disease. Ejection fraction was 40% with mildly elevated left ventricular end-diastolic pressure.  Carotid Doppler in November, 2015 showed only mild bilateral carotid stenosis which remained stable in 2017. He was seen recently for significant worsening of exertional dyspnea and chest pain . He has been dealing with significant anxiety symptoms. I proceeded with cardiac catheterization which showed patent LAD and RCA stents with no evidence of obstructive disease. Ejection fraction was 35-40% with severe anterior and apical hypokinesis. Left ventricular end-diastolic pressure was moderately elevated.  He was started on small dose furosemide. Imdur did not help with his symptoms. A lot of his symptoms were felt to be due to severe anxiety and this has improved significantly since most recent evaluation. No chest pain. He is trying to cut down on smoking..  Past Medical History:  Diagnosis Date  . Allergic rhinitis   . Anxiety   . Atherosclerosis   . Back pain   . Bronchitis    09-14-11  . CARDIOMYOPATHY 03/24/2010    did not tolerate Toprol and Lisinopril.   . Chest pain   . Chronic airway obstruction, not elsewhere classified   . Colon polyps 08/2012   colonoscopy; multiple colon polyps; repeat colonoscopy in one year.  [name] COPD (chronic obstructive pulmonary disease) ([place])   . Coronary artery disease 11/2009   Anterior MI with late presentation 11/2009. Cath showed a 100% proximal LAD occlusion, 99% mid RCA. Had a PCI and 2 DES to both LAD and RCA. EF was 35%. Nuclear stress test 05/13: prior anterior/inferior infarcts without ischemic, EF 43%, cardiac cath in 02/14: Widely patent stents with no other obstructive disease. EF 40%   . Depression   . Diabetes mellitus without complication ([place])   . GERD (gastroesophageal reflux disease)   . Headache(784.0)   . Helicobacter pylori (H. pylori)   . Hemorrhoids   . Hernia    inguinal  . Hyperlipidemia   . Hypertension   . Insomnia   . MI (myocardial infarction)   . Myalgia   . Peptic ulcer   . Shortness of breath dyspnea   . Status post dilation of esophageal narrowing 2000  . Thyroid dysfunction   . Tinea pedis   . Wears dentures    partial upper    Past Surgical History:  Procedure Laterality Date  . Admission  12/20/2012   COPD exacerbation.  [place].  [name] CARDIAC CATHETERIZATION    . CARDIAC CATHETERIZATION  02/05/13   [facility]  . CARDIAC CATHETERIZATION  10/14   [facility] : patent  stents with no change in anatomy. EF: 40$  . CARDIAC CATHETERIZATION Left 11/12/2016   Procedure: Left Heart Cath and Coronary Angiography;  Surgeon: Wellington Hampshire, MD;  Location: [facility];  Service: Cardiovascular;  Laterality: Left;  . COLONOSCOPY    . COLONOSCOPY WITH PROPOFOL N/A 05/18/2016   Procedure: COLONOSCOPY WITH PROPOFOL;  Surgeon: Lucilla Lame, MD;  Location: [facility];  Service: Endoscopy;  Laterality: N/A;  . CORONARY ANGIOPLASTY WITH STENT PLACEMENT  09/2009   LAD 3.0 X23 mm Xience DES, RCA: 4.0 X 15 mm Xience DES  . ESOPHAGEAL DILATION      . ESOPHAGOGASTRODUODENOSCOPY  2008  . ESOPHAGOGASTRODUODENOSCOPY  08/20/2012  . HERNIA REPAIR  07/20/2012   L inguinal hernia repair  . PENILE PROSTHESIS IMPLANT       Current Outpatient Prescriptions  Medication Sig Dispense Refill  . albuterol (PROVENTIL) (2.5 MG/3ML) 0.083% nebulizer solution Take 3 mLs (2.5 mg total) by nebulization every 6 (six) hours as needed for wheezing or shortness of breath. 360 vial 4  . aspirin 81 MG tablet Take 81 mg by mouth daily.      [name] atorvastatin (LIPITOR) 40 MG tablet Take 1 tablet (40 mg total) by mouth daily. 90 tablet 3  . CORTIFOAM 10 % rectal foam PLACE ONE APPLICATION RECTALLY TWICE DAILY 15 g 5  . fluticasone (FLONASE) 50 MCG/ACT nasal spray Place 1 spray into both nostrils 2 (two) times daily. 16 g 0  . Fluticasone-Salmeterol (ADVAIR DISKUS) 500-50 MCG/DOSE AEPB Inhale 1 puff into the lungs 2 (two) times daily. 180 each 3  . furosemide (LASIX) 20 MG tablet Take 1 tablet (20 mg total) by mouth daily. 30 tablet 3  . ipratropium-albuterol (DUONEB) 0.5-2.5 (3) MG/3ML SOLN Take 3 mLs by nebulization every 6 (six) hours as needed. 480 mL 3  . isosorbide mononitrate (IMDUR) 30 MG 24 hr tablet Take 1 tablet (30 mg total) by mouth daily. 30 tablet 3  . LORazepam (ATIVAN) 0.5 MG tablet Take 1 tablet (0.5 mg total) by mouth every 8 (eight) hours as needed for anxiety. 90 tablet 2  . losartan (COZAAR) 25 MG tablet Take 1 tablet (25 mg total) by mouth daily. 90 tablet 3  . metFORMIN (GLUCOPHAGE) 500 MG tablet TAKE 1 TABLET DAILY AFTER SUPPER 90 tablet 3  . Multiple Vitamin (MULTIVITAMIN) capsule Take 1 capsule by mouth daily.    . nicotine (NICODERM CQ - DOSED IN MG/24 HOURS) 21 mg/24hr patch Place 1 patch (21 mg total) onto the skin daily. 28 patch 12  . NICOTROL 10 MG inhaler INHALE CONTENTS OF 1 CARTRIDGE (CONTINUOUS PUFFING) INTO THE LUNGS AS NEEDED FOR SMOKING CESSATION ( 168 each 4  . nitroGLYCERIN (NITROSTAT) 0.4 MG SL tablet Place 1 tablet (0.4 mg  total) under the tongue every 5 (five) minutes as needed. 25 tablet 3  . PARoxetine (PAXIL-CR) 25 MG 24 hr tablet Take 1 tablet (25 mg total) by mouth daily. 30 tablet 1  . sertraline (ZOLOFT) 100 MG tablet Take 1-2 tablets (100-200 mg total) by mouth daily. 180 tablet 1  . sodium chloride (OCEAN) 0.65 % SOLN nasal spray Place 2 sprays into both nostrils every 2 (two) hours while awake. 88 mL 5  . sucralfate (CARAFATE) 1 GM/10ML suspension TAKE 10ML( 2 TEASPOONSFUL) BY MOUTH FOUR TIMES DAILY 3600 mL 1  . tiotropium (SPIRIVA HANDIHALER) 18 MCG inhalation capsule Place 1 capsule (18 mcg total) into inhaler and inhale daily. 90 capsule 3  . VENTOLIN HFA 108 (  90 Base) MCG/ACT inhaler USE 2 INHALATIONS INTO  THE LUNGS  EVERY 6 HOURS AS NEEDED FOR WHEEZING 54 g 1   No current facility-administered medications for this visit.     Allergies:   Effexor xr [venlafaxine hcl er] and Wellbutrin [bupropion]    Social History:  The patient  reports that he has been smoking Cigarettes.  He has a 42.00 pack-year smoking history. He has never used smokeless tobacco. He reports that he does not drink alcohol or use drugs.   Family History:  The patient's family history includes Alcohol abuse in his sister and sister; COPD in his sister; COPD (age of onset: 42) in his mother; Coronary artery disease (age of onset: 38) in his father; Diabetes in his brother and father; Heart attack in his brother and father; Heart attack (age of onset: 57) in his sister; Heart disease in his brother and father; Hypertension in his brother.    ROS:  Please see the history of present illness.   Otherwise, review of systems are positive for none.   All other systems are reviewed and negative.    PHYSICAL EXAM: VS:  BP 104/60 (BP Location: Left Arm, Patient Position: Sitting, Cuff Size: Normal)   Pulse 81   Ht 5\' 10"  (1.778 m)   Wt 146 lb 12 oz (66.6 kg)   BMI 21.06 kg/m  , BMI Body mass index is 21.06 kg/m. GEN: Well  nourished, well developed, in no acute distress  HEENT: normal  Neck: no JVD, carotid bruits, or masses Cardiac: RRR; no murmurs, rubs, or gallops,no edema  Respiratory:  clear to auscultation bilaterally With diminished breath sounds, normal work of breathing GI: soft, nontender, nondistended, + BS MS: no deformity or atrophy  Skin: warm and dry, no rash Neuro:  Strength and sensation are intact Psych: euthymic mood, full affect Right radial pulse is normal with no hematoma.  EKG:  EKG is ordered today. The ekg ordered today demonstrates normal sinus rhythm with old anteroseptal infarct. Left anterior fascicular block.   Recent Labs: 10/13/2016: ALT 18 12/01/2016: BUN 12; Creatinine, Ser 0.83; Hemoglobin 15.1; Platelets 248; Potassium 3.9; Sodium 138    Lipid Panel    Component Value Date/Time   CHOL 123 (L) 08/24/2016 1344   TRIG 126 08/24/2016 1344   TRIG 65 01/15/2010   HDL 68 08/24/2016 1344   CHOLHDL 1.8 08/24/2016 1344   VLDL 25 08/24/2016 1344   LDLCALC 30 08/24/2016 1344      Wt Readings from Last 3 Encounters:  12/28/16 146 lb 12 oz (66.6 kg)  12/15/16 146 lb (66.2 kg)  12/01/16 148 lb (67.1 kg)        ASSESSMENT AND PLAN:  1.  Coronary artery disease involving native coronary arteries with other forms of angina:   Recent cardiac catheterization was overall reassuring . His symptoms improved after treatment of his anxiety. Imdur was discontinued as it made no difference in his symptoms.  2. Ischemic cardiomyopathy: Most recent ejection fraction was 35-40. He did not tolerate treatment with a beta blocker and currently on low-dose losartan. His BP is too low.   His left ventricular end-diastolic pressure was moderately elevated. Continue small dose furosemide.  3. Hyperlipidemia: Continue treatment with atorvastatin. Most recent LDL was 30.  4. Tobacco use: He is trying to cut down on smoking again but he has not been successful in the past in quitting  completely.  Disposition:   FU with me in 6 months  Signed,  Kathlyn Sacramento, MD  12/28/2016 3:23 PM    [place]

## 2017-01-12 ENCOUNTER — Other Ambulatory Visit: Payer: Self-pay | Admitting: Family Medicine

## 2017-01-15 ENCOUNTER — Ambulatory Visit (INDEPENDENT_AMBULATORY_CARE_PROVIDER_SITE_OTHER): Payer: BLUE CROSS/BLUE SHIELD | Admitting: Family Medicine

## 2017-01-15 ENCOUNTER — Ambulatory Visit (INDEPENDENT_AMBULATORY_CARE_PROVIDER_SITE_OTHER): Payer: BLUE CROSS/BLUE SHIELD

## 2017-01-15 VITALS — BP 110/68 | HR 108 | Temp 99.1°F | Resp 18 | Ht 70.0 in | Wt 149.0 lb

## 2017-01-15 DIAGNOSIS — F418 Other specified anxiety disorders: Secondary | ICD-10-CM | POA: Diagnosis not present

## 2017-01-15 DIAGNOSIS — R059 Cough, unspecified: Secondary | ICD-10-CM

## 2017-01-15 DIAGNOSIS — R05 Cough: Secondary | ICD-10-CM | POA: Diagnosis not present

## 2017-01-15 DIAGNOSIS — J0101 Acute recurrent maxillary sinusitis: Secondary | ICD-10-CM

## 2017-01-15 DIAGNOSIS — J3089 Other allergic rhinitis: Secondary | ICD-10-CM | POA: Diagnosis not present

## 2017-01-15 DIAGNOSIS — F419 Anxiety disorder, unspecified: Secondary | ICD-10-CM

## 2017-01-15 DIAGNOSIS — F32A Depression, unspecified: Secondary | ICD-10-CM

## 2017-01-15 DIAGNOSIS — J441 Chronic obstructive pulmonary disease with (acute) exacerbation: Secondary | ICD-10-CM | POA: Diagnosis not present

## 2017-01-15 DIAGNOSIS — J302 Other seasonal allergic rhinitis: Secondary | ICD-10-CM

## 2017-01-15 DIAGNOSIS — F329 Major depressive disorder, single episode, unspecified: Secondary | ICD-10-CM

## 2017-01-15 DIAGNOSIS — E7800 Pure hypercholesterolemia, unspecified: Secondary | ICD-10-CM

## 2017-01-15 LAB — POCT INFLUENZA A/B
INFLUENZA A, POC: NEGATIVE
INFLUENZA B, POC: NEGATIVE

## 2017-01-15 IMAGING — DX DG CHEST 2V
2 series · 2 of 2 positions shown · non-contrast
Comparison: [DATE]

CLINICAL DATA: Cough, COPD

EXAM:
CHEST  2 VIEW

[chest pa]
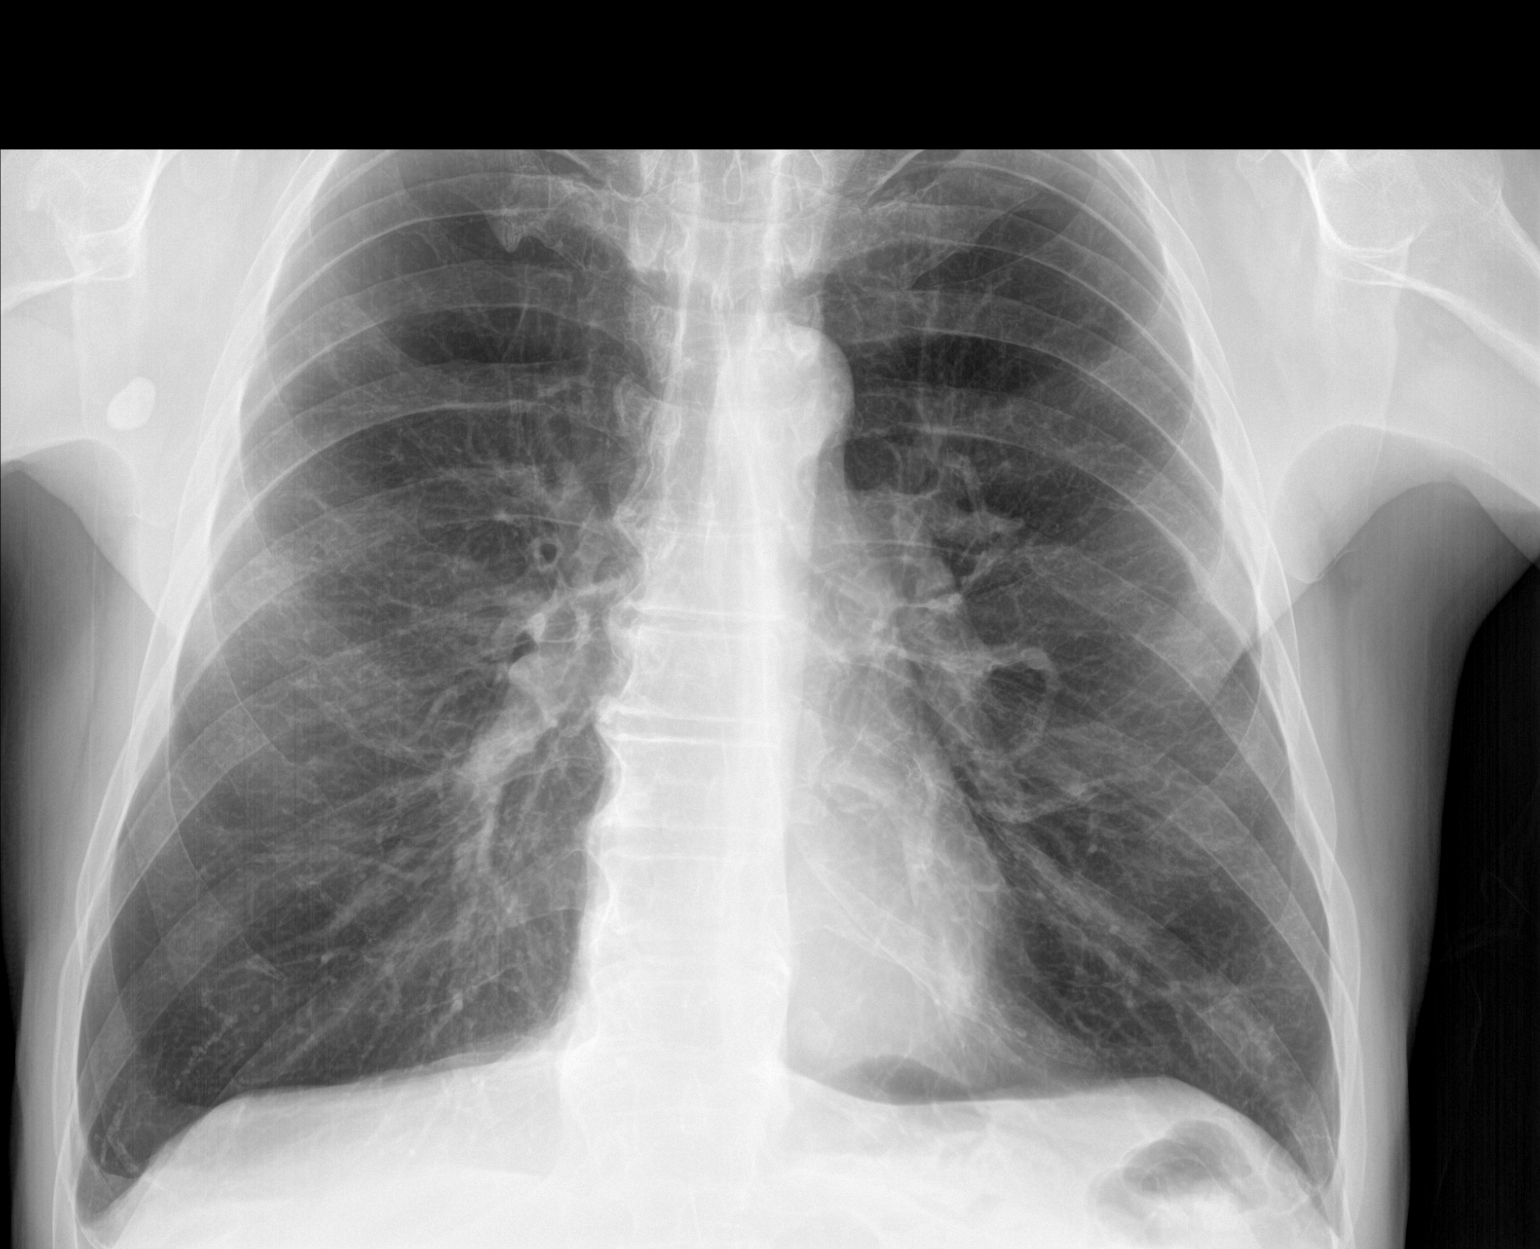

[chest lat]
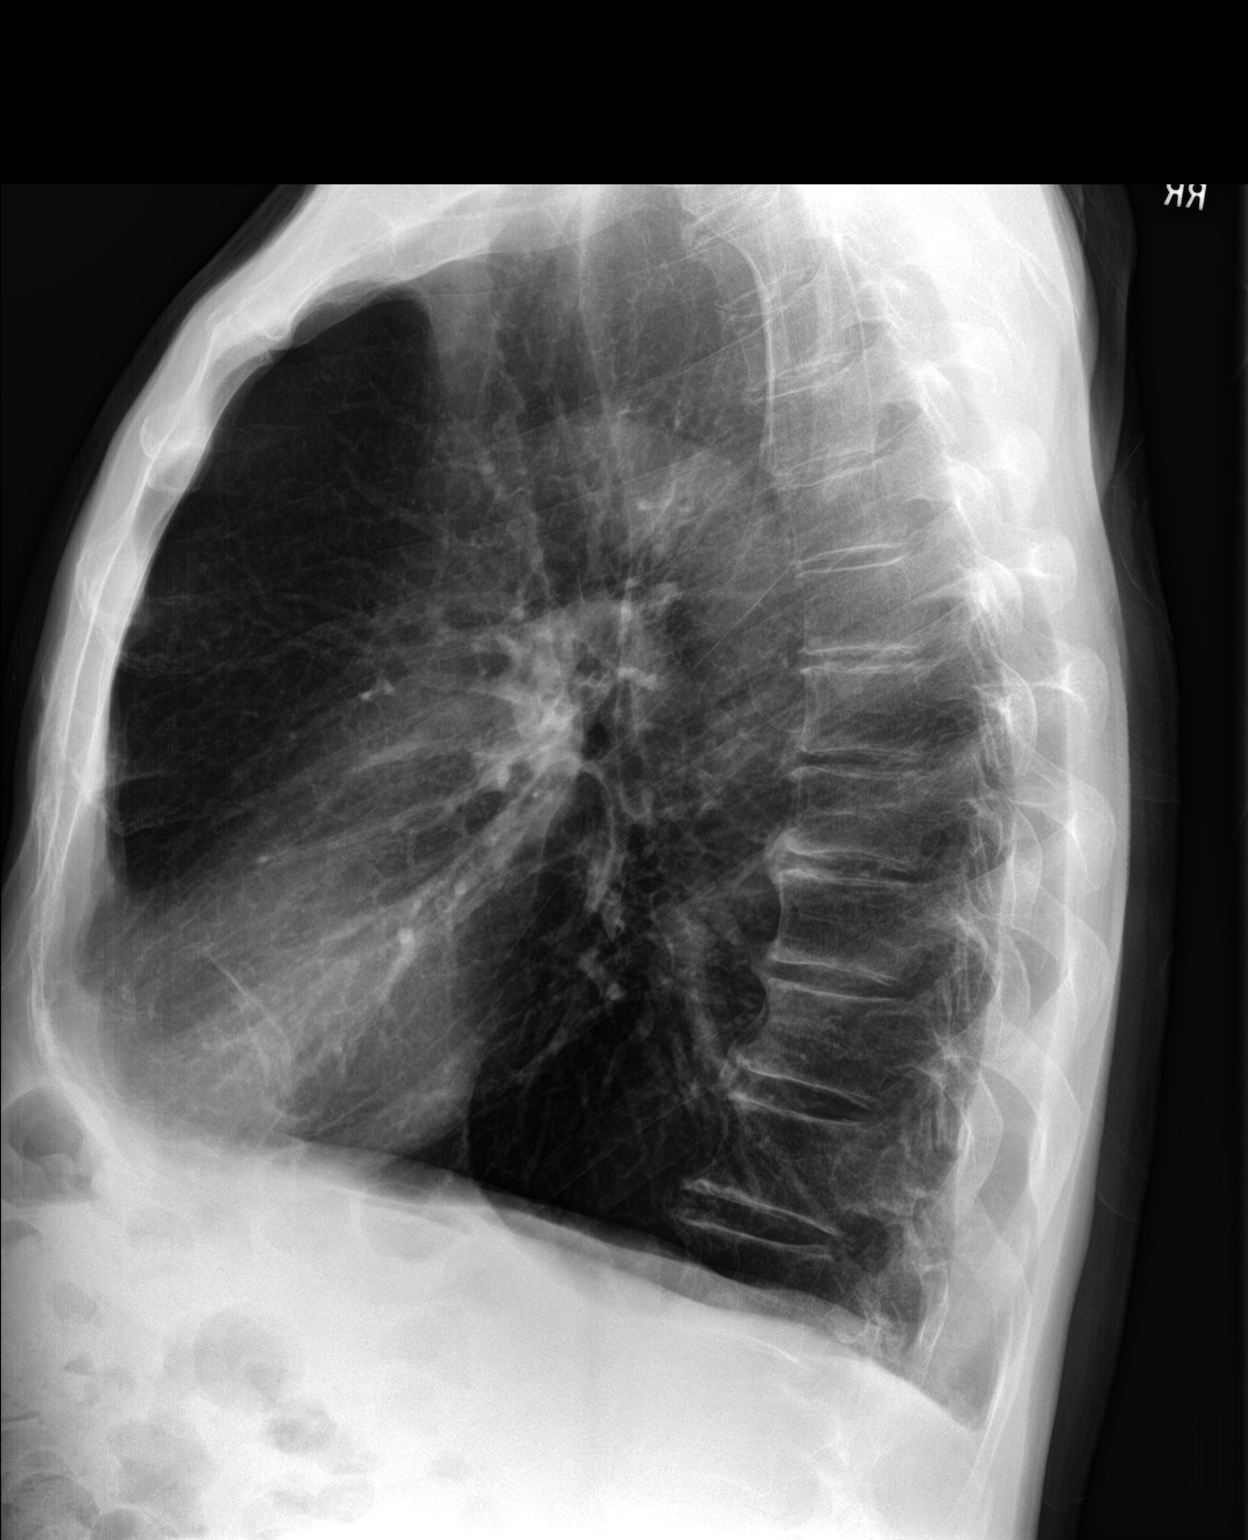

[2 of 2 positions shown; findings below may reference images not displayed]

FINDINGS: There is hyperinflation of the lungs compatible with COPD. Heart and
mediastinal contours are within normal limits. No focal opacities or
effusions. No acute bony abnormality.
IMPRESSION: COPD.  No active disease.

## 2017-01-15 MED ORDER — ATORVASTATIN CALCIUM 40 MG PO TABS: 40.0000 mg | ORAL_TABLET | Freq: Every day | ORAL | 3 refills | Status: DC

## 2017-01-15 MED ORDER — AMOXICILLIN-POT CLAVULANATE 875-125 MG PO TABS: 1.0000 | ORAL_TABLET | Freq: Two times a day (BID) | ORAL | 0 refills | Status: DC

## 2017-01-15 MED ORDER — IPRATROPIUM-ALBUTEROL 0.5-2.5 (3) MG/3ML IN SOLN: 3.0000 mL | Freq: Four times a day (QID) | RESPIRATORY_TRACT | 3 refills | Status: DC | PRN

## 2017-01-15 NOTE — Progress Notes (Signed)
Subjective:    Patient ID: Leslie Sims, male    DOB: Oct 22, 1951, 66 y.o.   MRN: [id number]  01/15/2017  Shortness of Breath and Follow-up (PAXIL)   HPI This 66 y.o. male presents for evaluation of shortness of breath and anxiety.  When made appointment on Tuesday, having a really difficult time breathing.  No fever; no chills/sweats.  +body aches mild.  Slight headache.  Throat is not sore but is scratchy.  No rhinorrhea; +nasal congestion; +sinus pressure; +coughing mild to moderate yet not more than normal.  Produces a lot of sputum every morning; sputum is white but thick.  Some wheezing.  Really SOB on Tuesday and Wednesday.  Using nebulizer every bid; other using 4-5 times per day.  Rescue inhaler using bid when goes to feed horses.  No v/d.  Taking Mucinex 1-2 times per day but not daily.  Burning in L anterior chest for the past week.  Also has pain in L shoulder.  Sleeps on couch.  Started vapor cigarettes a week ago.  Started researching more.  Wants to taking 12.5mg  daily.  Still does not have any energy; stays depressed.  No get up and go.  Ready to try something.  Taking Sertraline 1 bid.  Taking Lorazepam 1 every morning; might take one in afternoon. Never filled 25mg  Paxil.    Immunization History  Administered Date(s) Administered  . Influenza Whole 09/19/2012  . Influenza,inj,Quad PF,36+ Mos 10/24/2013, 11/25/2014, 03/02/2016, 10/27/2016  . Pneumococcal Conjugate-13 11/25/2014  . Pneumococcal Polysaccharide-23 08/24/2016, 10/27/2016  . Pneumococcal-Unspecified 12/20/2008  . Tdap 12/20/2008   BP Readings from Last 3 Encounters:  01/15/17 110/68  12/28/16 104/60  12/15/16 114/66   Wt Readings from Last 3 Encounters:  01/15/17 149 lb (67.6 kg)  12/28/16 146 lb 12 oz (66.6 kg)  12/15/16 146 lb (66.2 kg)    Review of Systems  Constitutional: Positive for fatigue. Negative for activity change, appetite change, chills, diaphoresis and fever.  HENT: Positive for  congestion, postnasal drip, rhinorrhea, sinus pain, sinus pressure and sneezing. Negative for ear pain.   Respiratory: Positive for cough. Negative for shortness of breath.   Cardiovascular: Positive for chest pain. Negative for palpitations and leg swelling.  Gastrointestinal: Negative for abdominal pain, diarrhea, nausea and vomiting.  Endocrine: Negative for cold intolerance, heat intolerance, polydipsia, polyphagia and polyuria.  Skin: Negative for color change, rash and wound.  Neurological: Negative for dizziness, tremors, seizures, syncope, facial asymmetry, speech difficulty, weakness, light-headedness, numbness and headaches.  Psychiatric/Behavioral: Positive for dysphoric mood. Negative for sleep disturbance. The patient is not nervous/anxious.     Past Medical History:  Diagnosis Date  . Allergic rhinitis   . Anxiety   . Atherosclerosis   . Back pain   . Bronchitis    09-14-11  . CARDIOMYOPATHY 03/24/2010   did not tolerate Toprol and Lisinopril.   . Chest pain   . Chronic airway obstruction, not elsewhere classified   . Colon polyps 08/2012   colonoscopy; multiple colon polyps; repeat colonoscopy in one year.  [name] COPD (chronic obstructive pulmonary disease) ([place])   . Coronary artery disease 11/2009   Anterior MI with late presentation 11/2009. Cath showed a 100% proximal LAD occlusion, 99% mid RCA. Had a PCI and 2 DES to both LAD and RCA. EF was 35%. Nuclear stress test 05/13: prior anterior/inferior infarcts without ischemic, EF 43%, cardiac cath in 02/14: Widely patent stents with no other obstructive disease. EF 40%   . Depression   .  Diabetes mellitus without complication ([place])   . GERD (gastroesophageal reflux disease)   . Headache(784.0)   . Helicobacter pylori (H. pylori)   . Hemorrhoids   . Hernia    inguinal  . Hyperlipidemia   . Hypertension   . Insomnia   . MI (myocardial infarction)   . Myalgia   . Peptic ulcer   . Shortness of breath dyspnea   . Status  post dilation of esophageal narrowing 2000  . Thyroid dysfunction   . Tinea pedis   . Wears dentures    partial upper   Past Surgical History:  Procedure Laterality Date  . Admission  12/20/2012   COPD exacerbation.  [place].  [name] CARDIAC CATHETERIZATION    . CARDIAC CATHETERIZATION  02/05/13   [facility]  . CARDIAC CATHETERIZATION  10/14   [facility] : patent stents with no change in anatomy. EF: 40$  . CARDIAC CATHETERIZATION Left 11/12/2016   Procedure: Left Heart Cath and Coronary Angiography;  Surgeon: Wellington Hampshire, MD;  Location: [facility];  Service: Cardiovascular;  Laterality: Left;  . COLONOSCOPY    . COLONOSCOPY WITH PROPOFOL N/A 05/18/2016   Procedure: COLONOSCOPY WITH PROPOFOL;  Surgeon: Lucilla Lame, MD;  Location: [facility] ENDOSCOPY;  Service: Endoscopy;  Laterality: N/A;  . CORONARY ANGIOPLASTY WITH STENT PLACEMENT  09/2009   LAD 3.0 X23 mm Xience DES, RCA: 4.0 X 15 mm Xience DES  . ESOPHAGEAL DILATION    . ESOPHAGOGASTRODUODENOSCOPY  2008  . ESOPHAGOGASTRODUODENOSCOPY  08/20/2012  . HERNIA REPAIR  07/20/2012   L inguinal hernia repair  . PENILE PROSTHESIS IMPLANT     Allergies  Allergen Reactions  . Effexor Xr [Venlafaxine Hcl Er]   . Wellbutrin [Bupropion] Other (See Comments)    Unknown/"made me feel real funny"    Social History   Social History  . Marital status: Married    Spouse name: N/A  . Number of children: 5  . Years of education: N/A   Occupational History  . 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 Smoking status: Current Every Day Smoker    Packs/day: 1.00    Years: 42.00    Types: Cigarettes  . Smokeless tobacco: Never Used     Comment: smoking 15-18 cigarettes daily  . Alcohol use No  . Drug use: No  . Sexual activity: Yes   Other Topics Concern  . Not on file   Social History Narrative   Marital status: married x 32 years; happily married.      Children: 4 children;  1 stepchild and 15 grandchildren; no  great grandchildren.      Lives: with wife; 2 dogs, 1 cat.      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      Tobacco: 1 ppd x 45 years      Alcohol: none; previous alcoholism      Drugs: none      Exercise: no formal exercise; physically demanding job; owns horses.      Seatbelts:  100%      Guns: one loaded unsecured gun in the home.      Sunscreen: none   Family History  Problem Relation Age of Onset  . Heart attack Brother     Brother #1  . Diabetes Brother     brother #2  . Hypertension Brother     #3  . Coronary artery disease Father 59    deceased  . Heart attack  Father   . Diabetes Father   . Heart disease Father   . COPD Mother 44    deceased  . Heart attack Sister 14    acute-cocaine abuse/deceased  . Alcohol abuse Sister     polysubstance abuse  . COPD Sister   . Alcohol abuse Sister     polysubstance abuse  . Heart disease Brother     AMI x multiple       Objective:    BP 110/68 (BP Location: Right Arm, Patient Position: Sitting, Cuff Size: Small)   Pulse (!) 108   Temp 99.1 F (37.3 C) (Oral)   Resp 18   Ht 5\' 10"  (1.778 m)   Wt 149 lb (67.6 kg)   SpO2 95%   BMI 21.38 kg/m  Physical Exam  Constitutional: He is oriented to person, place, and time. He appears well-developed and well-nourished. No distress.  HENT:  Head: Normocephalic and atraumatic.  Right Ear: External ear normal.  Left Ear: External ear normal.  Nose: Nose normal.  Mouth/Throat: Oropharynx is clear and moist.  Eyes: Conjunctivae and EOM are normal. Pupils are equal, round, and reactive to light.  Neck: Normal range of motion. Neck supple. Carotid bruit is not present. No thyromegaly present.  Cardiovascular: Normal rate, regular rhythm, normal heart sounds and intact distal pulses.  Exam reveals no gallop and no friction rub.   No murmur heard. Pulmonary/Chest: Effort normal and breath sounds normal. He has no wheezes. He has no rales.  Abdominal: Soft.  Bowel sounds are normal. He exhibits no distension and no mass. There is no tenderness. There is no rebound and no guarding.  Genitourinary: Rectal exam shows guaiac positive stool.  Lymphadenopathy:    He has no cervical adenopathy.  Neurological: He is alert and oriented to person, place, and time. No cranial nerve deficit.  Skin: Skin is warm and dry. No rash noted. He is not diaphoretic.  Psychiatric: He has a normal mood and affect. His behavior is normal.  Nursing note and vitals reviewed.  Results for orders placed or performed in visit on 01/15/17  POCT Influenza A/B  Result Value Ref Range   Influenza A, POC Negative Negative   Influenza B, POC Negative Negative   No results found.     Assessment & Plan:   1. Cough   2. COPD exacerbation ([place])   3. Seasonal and perennial allergic rhinitis   4. Anxiety and depression   5. Acute recurrent maxillary sinusitis   6. Pure hypercholesterolemia    -New acute sinusitis; rx for Augmentin provided; continue Mucinex DM and antihistamine -refill of Atorvastatin provided. -s/p CXR that is normal. -decrease Sertraline to 100mg  daily for one month; start Paxil CR 12.5mg  daily for one month; then decrease Sertraline to 1/2 tablet daily for one month and increase to Paxil CR 25mg  daily.  After second month, stop Sertraline and continue Paxil CR 25mg  daliy. -refill Duoneb as well -highly, highly recommend smoking cessation!  Orders Placed This Encounter  Procedures  . DG Chest 2 View    Standing Status:   Future    Number of Occurrences:   1    Standing Expiration Date:   01/15/2018    Order Specific Question:   Reason for Exam (SYMPTOM  OR DIAGNOSIS REQUIRED)    Answer:   cough; L sided chest burning; COPD; tobacco abuse    Order Specific Question:   Preferred imaging location?    Answer:   External  . POCT  Influenza A/B   Meds ordered this encounter  Medications  . atorvastatin (LIPITOR) 40 MG tablet    Sig: Take 1 tablet (40  mg total) by mouth daily.    Dispense:  90 tablet    Refill:  3  . ipratropium-albuterol (DUONEB) 0.5-2.5 (3) MG/3ML SOLN    Sig: Take 3 mLs by nebulization every 6 (six) hours as needed.    Dispense:  270 mL    Refill:  3  . amoxicillin-clavulanate (AUGMENTIN) 875-125 MG tablet    Sig: Take 1 tablet by mouth 2 (two) times daily.    Dispense:  20 tablet    Refill:  0    No Follow-up on file.   Nicha Hemann Elayne Guerin, M.D. Primary Care at [facility] previously [facility] [address] [place], [place]  [address] [phone] phone [phone] fax

## 2017-01-15 NOTE — Patient Instructions (Addendum)
Start Paxil 12.5mg  daily.  Decrease Sertraline to one tablet daily.   In one month, increase Paxil to 25mg  daily; decrease Sertraline to 1/2 tablet daily.   In one month, continue Paxil 25mg  daily and STOP Sertraline.    IF you received an x-ray today, you will receive an invoice from [facility]. Please contact [facility] at [phone] with questions or concerns regarding your invoice.   IF you received labwork today, you will receive an invoice from [place]. Please contact [facility] at [phone] with questions or concerns regarding your invoice.   Our billing staff will not be able to assist you with questions regarding bills from these companies.  You will be contacted with the lab results as soon as they are available. The fastest way to get your results is to activate your My Chart account. Instructions are located on the last page of this paperwork. If you have not heard from [place] regarding the results in 2 weeks, please contact this office.    Sinusitis, Adult Sinusitis is soreness and inflammation of your sinuses. Sinuses are hollow spaces in the bones around your face. Your sinuses are located:  Around your eyes.  In the middle of your forehead.  Behind your nose.  In your cheekbones. Your sinuses and nasal passages are lined with a stringy fluid (mucus). Mucus normally drains out of your sinuses. When your nasal tissues become inflamed or swollen, the mucus can become trapped or blocked so air cannot flow through your sinuses. This allows bacteria, viruses, and funguses to grow, which leads to infection. Sinusitis can develop quickly and last for 7?10 days (acute) or for more than 12 weeks (chronic). Sinusitis often develops after a cold. What are the causes? This condition is caused by anything that creates swelling in the sinuses or stops mucus from draining, including:  Allergies.  Asthma.  Bacterial or viral infection.  Abnormally shaped bones  between the nasal passages.  Nasal growths that contain mucus (nasal polyps).  Narrow sinus openings.  Pollutants, such as chemicals or irritants in the air.  A foreign object stuck in the nose.  A fungal infection. This is rare. What increases the risk? The following factors may make you more likely to develop this condition:  Having allergies or asthma.  Having had a recent cold or respiratory tract infection.  Having structural deformities or blockages in your nose or sinuses.  Having a weak immune system.  Doing a lot of swimming or diving.  Overusing nasal sprays.  Smoking. What are the signs or symptoms? The main symptoms of this condition are pain and a feeling of pressure around the affected sinuses. Other symptoms include:  Upper toothache.  Earache.  Headache.  Bad breath.  Decreased sense of smell and taste.  A cough that may get worse at night.  Fatigue.  Fever.  Thick drainage from your nose. The drainage is often green and it may contain pus (purulent).  Stuffy nose or congestion.  Postnasal drip. This is when extra mucus collects in the throat or back of the nose.  Swelling and warmth over the affected sinuses.  Sore throat.  Sensitivity to light. How is this diagnosed? This condition is diagnosed based on symptoms, a medical history, and a physical exam. To find out if your condition is acute or chronic, your health care provider may:  Look in your nose for signs of nasal polyps.  Tap over the affected sinus to check for signs of infection.  View the inside  of your sinuses using an imaging device that has a light attached (endoscope). If your health care provider suspects that you have chronic sinusitis, you may also:  Be tested for allergies.  Have a sample of mucus taken from your nose (nasal culture) and checked for bacteria.  Have a mucus sample examined to see if your sinusitis is related to an allergy. If your sinusitis  does not respond to treatment and it lasts longer than 8 weeks, you may have an MRI or CT scan to check your sinuses. These scans also help to determine how severe your infection is. In rare cases, a bone biopsy may be done to rule out more serious types of fungal sinus disease. How is this treated? Treatment for sinusitis depends on the cause and whether your condition is chronic or acute. If a virus is causing your sinusitis, your symptoms will go away on their own within 10 days. You may be given medicines to relieve your symptoms, including:  Topical nasal decongestants. They shrink swollen nasal passages and let mucus drain from your sinuses.  Antihistamines. These drugs block inflammation that is triggered by allergies. This can help to ease swelling in your nose and sinuses.  Topical nasal corticosteroids. These are nasal sprays that ease inflammation and swelling in your nose and sinuses.  Nasal saline washes. These rinses can help to get rid of thick mucus in your nose. If your condition is caused by bacteria, you will be given an antibiotic medicine. If your condition is caused by a fungus, you will be given an antifungal medicine. Surgery may be needed to correct underlying conditions, such as narrow nasal passages. Surgery may also be needed to remove polyps. Follow these instructions at home: Medicines  Take, use, or apply over-the-counter and prescription medicines only as told by your health care provider. These may include nasal sprays.  If you were prescribed an antibiotic medicine, take it as told by your health care provider. Do not stop taking the antibiotic even if you start to feel better. Hydrate and Humidify  Drink enough water to keep your urine clear or pale yellow. Staying hydrated will help to thin your mucus.  Use a cool mist humidifier to keep the humidity level in your home above 50%.  Inhale steam for 10-15 minutes, 3-4 times a day or as told by your health  care provider. You can do this in the bathroom while a hot shower is running.  Limit your exposure to cool or dry air. Rest  Rest as much as possible.  Sleep with your head raised (elevated).  Make sure to get enough sleep each night. General instructions  Apply a warm, moist washcloth to your face 3-4 times a day or as told by your health care provider. This will help with discomfort.  Wash your hands often with soap and water to reduce your exposure to viruses and other germs. If soap and water are not available, use hand sanitizer.  Do not smoke. Avoid being around people who are smoking (secondhand smoke).  Keep all follow-up visits as told by your health care provider. This is important. Contact a health care provider if:  You have a fever.  Your symptoms get worse.  Your symptoms do not improve within 10 days. Get help right away if:  You have a severe headache.  You have persistent vomiting.  You have pain or swelling around your face or eyes.  You have vision problems.  You develop confusion.  Your  neck is stiff.  You have trouble breathing. This information is not intended to replace advice given to you by your health care provider. Make sure you discuss any questions you have with your health care provider. Document Released: 12/06/2005 Document Revised: 08/01/2016 Document Reviewed: 10/01/2015 Elsevier Interactive Patient Education  2017 [employer].

## 2017-01-25 ENCOUNTER — Other Ambulatory Visit: Payer: Self-pay | Admitting: Family Medicine

## 2017-01-26 NOTE — Telephone Encounter (Signed)
Left a voice message for patient. Lorazepam is controlled substance. Plan was to wean patient off of benzo as stated in office visit 12/15/2016. Patient needs to come back for an office visit with his PCP, Dr. Tamala Julian, for continued management and plan to wean off which is in line with our controlled substance policy.

## 2017-01-27 MED ORDER — LORAZEPAM 0.5 MG PO TABS: 0.5000 mg | ORAL_TABLET | Freq: Two times a day (BID) | ORAL | 0 refills | Status: DC | PRN

## 2017-01-27 NOTE — Telephone Encounter (Signed)
Refill of Lorazepam approved; filled 90 tablets one month ago; will need assistance weaning; approved bid for one month with plan to decrease to daily for one month next month and then PRN.

## 2017-01-27 NOTE — Addendum Note (Signed)
Addended by: [name] on: 01/27/2017 10:24 AM   Modules accepted: Orders

## 2017-01-27 NOTE — Telephone Encounter (Signed)
Faxed rx to pharmacy  

## 2017-02-03 ENCOUNTER — Encounter: Payer: Self-pay | Admitting: Internal Medicine

## 2017-02-03 ENCOUNTER — Ambulatory Visit: Payer: BLUE CROSS/BLUE SHIELD | Admitting: Pulmonary Disease

## 2017-02-03 ENCOUNTER — Ambulatory Visit (INDEPENDENT_AMBULATORY_CARE_PROVIDER_SITE_OTHER): Payer: BLUE CROSS/BLUE SHIELD | Admitting: Internal Medicine

## 2017-02-03 VITALS — BP 142/84 | HR 87 | Wt 149.0 lb

## 2017-02-03 DIAGNOSIS — J449 Chronic obstructive pulmonary disease, unspecified: Secondary | ICD-10-CM | POA: Diagnosis not present

## 2017-02-03 DIAGNOSIS — J441 Chronic obstructive pulmonary disease with (acute) exacerbation: Secondary | ICD-10-CM | POA: Diagnosis not present

## 2017-02-03 MED ORDER — PREDNISONE 20 MG PO TABS: 40.0000 mg | ORAL_TABLET | Freq: Every day | ORAL | 0 refills | Status: DC

## 2017-02-03 MED ORDER — AMOXICILLIN-POT CLAVULANATE 875-125 MG PO TABS: 1.0000 | ORAL_TABLET | Freq: Two times a day (BID) | ORAL | 0 refills | Status: AC

## 2017-02-03 NOTE — Addendum Note (Signed)
Addended by: Renelda Mom on: 02/03/2017 09:58 AM   Modules accepted: Orders

## 2017-02-03 NOTE — Patient Instructions (Signed)
1.prednisone 40 mg daily for 7 days 2.augmentin 875 mg daily for 10 days 3.albuterol as needed 4.douneb in office today 5.smoking cessation strongly advised 6.check ONO and 6WMT  Follow up with Dr [name]

## 2017-02-03 NOTE — Progress Notes (Signed)
PULMONARY OFFICE FOLLOW UP NOTE  Requesting MD/Service: [name], MD Date of initial consultation: 08/10/16 Reason for consultation: COPD, smoker  PT PROFILE: 74 M smoker with severe emphysema seen by multiple pulmonologists in past ([place], [place], [place], [place]) referred for further eval and mgmt of COPD   DATA: 07/24/12 Office Spirometry: Severe obstruction (FEV1 31% pred) 08/31/13 Office Spirometry: very severe obstruction (FEV1 25% pred) 06/18/15 CT chest: Severe emphysema 07/27/16 CXR: Severe hyperinflation and hyperlucency   SUBJ: Follow up COPD exacerbation Patient still feels bad, cough and chest congestion with wheezing Just completed z pak and prednisone taper  Still smokes 1 ppd Uses avdair and spiriva  Has thick productive cough Has chronic cough and productive cough  OBJ: Vitals:   02/03/17 0927  BP: (!) 142/84  Pulse: 87  SpO2: 100%  Weight: 149 lb (67.6 kg)     EXAM:  Gen: WDWN, Appears much older than his true age, No overt respiratory distress HEENT: NCAT, sclera white, oropharynx normal Neck: Supple without LAN, thyromegaly, JVD Lungs: breath sounds moderately diminished, + wheezes Cardiovascular: RRR, no murmurs noted Abdomen: Soft, nontender, normal BS Ext: without clubbing, cyanosis, edema Neuro: CNs grossly intact, motor and sensory intact  DATA:   BMP Latest Ref Rng & Units 12/01/2016 11/29/2016 11/05/2016  Glucose 65 - 99 mg/dL 99 124(H) 148(H)  BUN 6 - 20 mg/dL 12 8 10   Creatinine 0.61 - 1.24 mg/dL 0.83 0.87 0.82  BUN/Creat Ratio 10 - 24 - - 12  Sodium 135 - 145 mmol/L 138 140 142  Potassium 3.5 - 5.1 mmol/L 3.9 3.5 4.2  Chloride 101 - 111 mmol/L 100(L) 101 100  CO2 22 - 32 mmol/L 31 31 25   Calcium 8.9 - 10.3 mg/dL 9.3 9.4 9.6    CBC Latest Ref Rng & Units 12/01/2016 11/05/2016 10/19/2016  WBC 3.8 - 10.6 K/uL 9.1 6.7 9.0  Hemoglobin 13.0 - 18.0 g/dL 15.1 - 15.2  Hematocrit 40.0 - 52.0 % 45.2 44.4 44.9  Platelets 150 - 440  K/uL 248 217 259    CXR:  NNF  IMPRESSION:   66 yo white male with Severe COPD Gold Stage D with acute COPD exacerbation with active smoking  PLAN:  1.prednisone 40 mg daily for 7 days 2.augmentin 875 mg daily for 10 days 3.albuterol as needed 4.douneb in office today 5.smoking cessation strongly advised 6.check ONO and 6WMT  Follow up in 4 weeks with Dr.[name]  I have personally obtained a history, examined the patient, evaluated Pertinent laboratory and [employer]/imaging results, and  formulated the assessment and plan   The Patient requires high complexity decision making for assessment and support, frequent evaluation and titration of therapies.   Patient satisfied with Plan of action and management. All questions answered  Corrin Parker, M.D.  [facility]  Medical Director [place] Director [facility] Cardio-Pulmonary Department

## 2017-02-09 ENCOUNTER — Ambulatory Visit: Payer: BLUE CROSS/BLUE SHIELD | Admitting: Family Medicine

## 2017-02-17 ENCOUNTER — Other Ambulatory Visit: Payer: Self-pay

## 2017-02-17 DIAGNOSIS — R0789 Other chest pain: Secondary | ICD-10-CM

## 2017-02-17 DIAGNOSIS — I5022 Chronic systolic (congestive) heart failure: Secondary | ICD-10-CM

## 2017-02-17 MED ORDER — LOSARTAN POTASSIUM 25 MG PO TABS: 25.0000 mg | ORAL_TABLET | Freq: Every day | ORAL | 3 refills | Status: DC

## 2017-03-01 ENCOUNTER — Encounter: Payer: Self-pay | Admitting: Family Medicine

## 2017-03-01 ENCOUNTER — Ambulatory Visit (INDEPENDENT_AMBULATORY_CARE_PROVIDER_SITE_OTHER): Payer: BLUE CROSS/BLUE SHIELD | Admitting: Family Medicine

## 2017-03-01 VITALS — BP 128/67 | HR 93 | Temp 98.2°F | Resp 16 | Ht 70.0 in | Wt 146.0 lb

## 2017-03-01 DIAGNOSIS — Z716 Tobacco abuse counseling: Secondary | ICD-10-CM

## 2017-03-01 DIAGNOSIS — J449 Chronic obstructive pulmonary disease, unspecified: Secondary | ICD-10-CM

## 2017-03-01 DIAGNOSIS — I25118 Atherosclerotic heart disease of native coronary artery with other forms of angina pectoris: Secondary | ICD-10-CM | POA: Diagnosis not present

## 2017-03-01 DIAGNOSIS — I10 Essential (primary) hypertension: Secondary | ICD-10-CM

## 2017-03-01 DIAGNOSIS — E119 Type 2 diabetes mellitus without complications: Secondary | ICD-10-CM

## 2017-03-01 DIAGNOSIS — I5022 Chronic systolic (congestive) heart failure: Secondary | ICD-10-CM | POA: Diagnosis not present

## 2017-03-01 DIAGNOSIS — E7800 Pure hypercholesterolemia, unspecified: Secondary | ICD-10-CM | POA: Diagnosis not present

## 2017-03-01 DIAGNOSIS — J3089 Other allergic rhinitis: Secondary | ICD-10-CM | POA: Diagnosis not present

## 2017-03-01 DIAGNOSIS — J302 Other seasonal allergic rhinitis: Secondary | ICD-10-CM | POA: Diagnosis not present

## 2017-03-01 MED ORDER — NICOTINE POLACRILEX 4 MG MT GUM: 4.0000 mg | CHEWING_GUM | OROMUCOSAL | 0 refills | Status: DC | PRN

## 2017-03-01 MED ORDER — AZELASTINE HCL 0.1 % NA SOLN: 2.0000 | Freq: Two times a day (BID) | NASAL | 12 refills | Status: DC

## 2017-03-01 MED ORDER — FLUTICASONE PROPIONATE 50 MCG/ACT NA SUSP: 1.0000 | Freq: Two times a day (BID) | NASAL | 3 refills | Status: DC

## 2017-03-01 MED ORDER — FLUTICASONE PROPIONATE 50 MCG/ACT NA SUSP: 1.0000 | Freq: Two times a day (BID) | NASAL | 2 refills | Status: DC

## 2017-03-01 MED ORDER — NICOTINE 21 MG/24HR TD PT24: 21.0000 mg | MEDICATED_PATCH | Freq: Every day | TRANSDERMAL | 12 refills | Status: DC

## 2017-03-01 NOTE — Patient Instructions (Addendum)
Change Paroxetine to bedtime. Start pulmonary rehab. Start Flonase daily. Start nicorette gum.    IF you received an x-ray today, you will receive an invoice from [facility]. Please contact [facility] at [phone] with questions or concerns regarding your invoice.   IF you received labwork today, you will receive an invoice from [place]. Please contact [facility] at [phone] with questions or concerns regarding your invoice.   Our billing staff will not be able to assist you with questions regarding bills from these companies.  You will be contacted with the lab results as soon as they are available. The fastest way to get your results is to activate your My Chart account. Instructions are located on the last page of this paperwork. If you have not heard from [place] regarding the results in 2 weeks, please contact this office.

## 2017-03-01 NOTE — Progress Notes (Signed)
Subjective:    Patient ID: Leslie Sims, male    DOB: December 23, 1950, 66 y.o.   MRN: [id number]  03/01/2017  Follow-up   HPI This 66 y.o. male presents for  Two month follow-up of COPD, CAD with CHF, anxiety/depression, tobacco abuse ongoing.  Still complaining of lack of energy; no energy to do anything.  Scheduled for home sleep study; also scheduled this week for six minute walk. Has been to [facility]; placed on Zpack and Prednisone.  Went to [place]; waited for 1.5 hours.  S/p labs and CXR; recommended smoking cessation; recommended Mucinex and inhalers.  On 02/03/17, evaluated by Dr. Mortimer Fries; prescribed Augmentin and Prednisone, Duoneb in office, smoking cessation.  When sleeping, no cough.  When up, coughs and has horrible congestion. Using netti pot; gargling with salt water.  Throat gets sore; feels like throat is swelling.  Stays congested.  Less head congestion. Significant nasal congestion. Smoking 1ppd now; decreased from 2ppd two months ago.  Spring is a horrible time for allergies for patient.  Breathing is just not good.    Review of Systems  Constitutional: Negative for activity change, appetite change, chills, diaphoresis, fatigue and fever.  HENT: Positive for congestion, postnasal drip and rhinorrhea. Negative for ear pain, sore throat and trouble swallowing.   Respiratory: Positive for shortness of breath and wheezing. Negative for cough.   Cardiovascular: Negative for chest pain, palpitations and leg swelling.  Gastrointestinal: Negative for abdominal pain, diarrhea, nausea and vomiting.  Endocrine: Negative for cold intolerance, heat intolerance, polydipsia, polyphagia and polyuria.  Skin: Negative for color change, rash and wound.  Neurological: Negative for dizziness, tremors, seizures, syncope, facial asymmetry, speech difficulty, weakness, light-headedness, numbness and headaches.  Psychiatric/Behavioral: Positive for dysphoric mood. Negative for sleep  disturbance. The patient is nervous/anxious.     Past Medical History:  Diagnosis Date  . Allergic rhinitis   . Anxiety   . Atherosclerosis   . Back pain   . Bronchitis    09-14-11  . CARDIOMYOPATHY 03/24/2010   did not tolerate Toprol and Lisinopril.   . Chest pain   . Chronic airway obstruction, not elsewhere classified   . Colon polyps 08/2012   colonoscopy; multiple colon polyps; repeat colonoscopy in one year.  [name] COPD (chronic obstructive pulmonary disease) ([place])   . Coronary artery disease 11/2009   Anterior MI with late presentation 11/2009. Cath showed a 100% proximal LAD occlusion, 99% mid RCA. Had a PCI and 2 DES to both LAD and RCA. EF was 35%. Nuclear stress test 05/13: prior anterior/inferior infarcts without ischemic, EF 43%, cardiac cath in 02/14: Widely patent stents with no other obstructive disease. EF 40%   . Depression   . Diabetes mellitus without complication ([place])   . GERD (gastroesophageal reflux disease)   . Headache(784.0)   . Helicobacter pylori (H. pylori)   . Hemorrhoids   . Hernia    inguinal  . Hyperlipidemia   . Hypertension   . Insomnia   . MI (myocardial infarction)   . Myalgia   . Peptic ulcer   . Shortness of breath dyspnea   . Status post dilation of esophageal narrowing 2000  . Thyroid dysfunction   . Tinea pedis   . Wears dentures    partial upper   Past Surgical History:  Procedure Laterality Date  . Admission  12/20/2012   COPD exacerbation.  [place].  [name] CARDIAC CATHETERIZATION    . CARDIAC CATHETERIZATION  02/05/13   [facility]  .  CARDIAC CATHETERIZATION  10/14   [facility] : patent stents with no change in anatomy. EF: 40$  . CARDIAC CATHETERIZATION Left 11/12/2016   Procedure: Left Heart Cath and Coronary Angiography;  Surgeon: Wellington Hampshire, MD;  Location: [facility];  Service: Cardiovascular;  Laterality: Left;  . COLONOSCOPY    . COLONOSCOPY WITH PROPOFOL N/A 05/18/2016   Procedure: COLONOSCOPY WITH PROPOFOL;  Surgeon: Lucilla Lame, MD;  Location: [facility] ENDOSCOPY;  Service: Endoscopy;  Laterality: N/A;  . CORONARY ANGIOPLASTY WITH STENT PLACEMENT  09/2009   LAD 3.0 X23 mm Xience DES, RCA: 4.0 X 15 mm Xience DES  . ESOPHAGEAL DILATION    . ESOPHAGOGASTRODUODENOSCOPY  2008  . ESOPHAGOGASTRODUODENOSCOPY  08/20/2012  . HERNIA REPAIR  07/20/2012   L inguinal hernia repair  . PENILE PROSTHESIS IMPLANT     Allergies  Allergen Reactions  . Effexor Xr [Venlafaxine Hcl Er]   . Wellbutrin [Bupropion] Other (See Comments)    Unknown/"made me feel real funny"    Social History   Social History  . Marital status: Married    Spouse name: N/A  . Number of children: 5  . Years of education: N/A   Occupational History  . 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 Smoking status: Current Every Day Smoker    Packs/day: 0.50    Years: 42.00    Types: Cigarettes  . Smokeless tobacco: Never Used     Comment: smoking 15-18 cigarettes daily  . Alcohol use No  . Drug use: No  . Sexual activity: Yes   Other Topics Concern  . Not on file   Social History Narrative   Marital status: married x 32 years; happily married.      Children: 4 children;  1 stepchild and 15 grandchildren; no great grandchildren.      Lives: with wife; 2 dogs, 1 cat.      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      Tobacco: 1 ppd x 45 years      Alcohol: none; previous alcoholism      Drugs: none      Exercise: no formal exercise; physically demanding job; owns horses.      Seatbelts:  100%      Guns: one loaded unsecured gun in the home.      Sunscreen: none   Family History  Problem Relation Age of Onset  . Heart attack Brother     Brother #1  . Diabetes Brother     brother #2  . Hypertension Brother     #3  . Coronary artery disease Father 73    deceased  . Heart attack Father   . Diabetes Father   . Heart disease Father   . COPD Mother 71    deceased  . Heart attack Sister 105     acute-cocaine abuse/deceased  . Alcohol abuse Sister     polysubstance abuse  . COPD Sister   . Alcohol abuse Sister     polysubstance abuse  . Heart disease Brother     AMI x multiple       Objective:    BP 128/67   Pulse 93   Temp 98.2 F (36.8 C) (Oral)   Resp 16   Ht 5\' 10"  (1.778 m)   Wt 146 lb (66.2 kg)   SpO2 93%   BMI 20.95 kg/m  Physical Exam  Constitutional: He is oriented to person, place,  and time. He appears well-developed and well-nourished. No distress.  HENT:  Head: Normocephalic and atraumatic.  Right Ear: External ear normal.  Left Ear: External ear normal.  Nose: Nose normal.  Mouth/Throat: Oropharynx is clear and moist.  Eyes: Conjunctivae and EOM are normal. Pupils are equal, round, and reactive to light.  Neck: Normal range of motion. Neck supple. Carotid bruit is not present. No thyromegaly present.  Cardiovascular: Normal rate, regular rhythm, normal heart sounds and intact distal pulses.  Exam reveals no gallop and no friction rub.   No murmur heard. Pulmonary/Chest: Effort normal and breath sounds normal. He has no wheezes. He has no rales.  Distant breath sounds throughout.  Abdominal: Soft. Bowel sounds are normal. He exhibits no distension and no mass. There is no tenderness. There is no rebound and no guarding.  Lymphadenopathy:    He has no cervical adenopathy.  Neurological: He is alert and oriented to person, place, and time. No cranial nerve deficit.  Skin: Skin is warm and dry. No rash noted. He is not diaphoretic.  Psychiatric: He has a normal mood and affect. His behavior is normal.  Nursing note and vitals reviewed.   Depression screen [facility] 2/9 01/15/2017 12/15/2016 11/18/2016 10/27/2016 10/13/2016  Decreased Interest 0 1 3 2 2   Down, Depressed, Hopeless 0 1 3 2 2   PHQ - 2 Score 0 2 6 4 4   Altered sleeping - 0 2 3 3   Tired, decreased energy - 3 2 3 3   Change in appetite - 0 2 2 0  Feeling bad or failure about yourself  - 0 3  1 2   Trouble concentrating - 3 3 2 2   Moving slowly or fidgety/restless - 3 0 1 2  Suicidal thoughts - 0 0 - 0  PHQ-9 Score - 11 18 16 16   Difficult doing work/chores - - - - Somewhat difficult       Assessment & Plan:   1. Type 2 diabetes mellitus without complication, without long-term current use of insulin ([place])   2. Essential hypertension   3. Pure hypercholesterolemia   4. COPD, very severe ([place])   5. Seasonal and perennial allergic rhinitis   6. Atherosclerosis of native coronary artery of native heart with other form of angina pectoris ([place])   7. Chronic systolic heart failure (H[facility])    -stopped Metformin due to concern of potential side effects of medication; obtain labs; monitor closely. Continue with dietary modification. -COPD symptoms continue to worsen; counseled extensively during visit regarding progressive nature of COPD with ongoing smoking; highly encouraged smoking cessation with patient; counseled regarding cessation for 15 minutes in office.  Provided rx for nicorette gum and patches. -pt requesting referral to pulmonary rehab; referral placed; highly supportive of resuming pulmonary rehab. -acutely suffering with allergic rhinitis; rx for Flonase provided.  Also recommend restarting oral antihistamine. -s/p extensive cardiac evaluation recently; stable from a cardiac standpoint. -anxiety/depression all surrounding declining health; counseling provided; continue current SSRI and benzo.  Change paroxetine to qhs due to fatigue/hypersomnolence. -undergo sleep study and six minute walk as scheduled by pulm.   Orders Placed This Encounter  Procedures  . CBC with Differential/Platelet  . Comprehensive metabolic panel    Order Specific Question:   Has the patient fasted?    Answer:   Yes  . Lipid panel    Order Specific Question:   Has the patient fasted?    Answer:   Yes  . Hemoglobin A1c  . Hemoglobin A1c  .  AMB referral to pulmonary rehabilitation    Referral  Priority:   Routine    Referral Type:   Consultation    Number of Visits Requested:   1  . HM Diabetes Foot Exam   Meds ordered this encounter  Medications  . DISCONTD: fluticasone (FLONASE) 50 MCG/ACT nasal spray    Sig: Place 1 spray into both nostrils 2 (two) times daily.    Dispense:  16 g    Refill:  2  . fluticasone (FLONASE) 50 MCG/ACT nasal spray    Sig: Place 1 spray into both nostrils 2 (two) times daily.    Dispense:  48 g    Refill:  3  . DISCONTD: nicotine polacrilex (NICORETTE) 4 MG gum    Sig: Take 1 each (4 mg total) by mouth as needed for smoking cessation.    Dispense:  220 tablet    Refill:  0  . nicotine (NICODERM CQ - DOSED IN MG/24 HOURS) 21 mg/24hr patch    Sig: Place 1 patch (21 mg total) onto the skin daily.    Dispense:  28 patch    Refill:  12  . nicotine polacrilex (NICORETTE) 4 MG gum    Sig: Take 1 each (4 mg total) by mouth as needed for smoking cessation.    Dispense:  220 tablet    Refill:  0  . azelastine (ASTELIN) 0.1 % nasal spray    Sig: Place 2 sprays into both nostrils 2 (two) times daily. Use in each nostril as directed    Dispense:  30 mL    Refill:  12    Return in about 4 months (around 07/01/2017) for complete physical examiniation.   Callum Wolf Elayne Guerin, M.D. Primary Care at [facility] previously [facility] [address] [place], [place]  [phone] phone [phone] fax

## 2017-03-02 LAB — CBC WITH DIFFERENTIAL/PLATELET
BASOS: 0 %
Basophils Absolute: 0 10*3/uL (ref 0.0–0.2)
EOS (ABSOLUTE): 0.1 10*3/uL (ref 0.0–0.4)
Eos: 1 %
HEMATOCRIT: 40.1 % (ref 37.5–51.0)
Hemoglobin: 13.5 g/dL (ref 13.0–17.7)
Immature Grans (Abs): 0 10*3/uL (ref 0.0–0.1)
Immature Granulocytes: 0 %
LYMPHS ABS: 1.5 10*3/uL (ref 0.7–3.1)
Lymphs: 20 %
MCH: 34.3 pg — AB (ref 26.6–33.0)
MCHC: 33.7 g/dL (ref 31.5–35.7)
MCV: 102 fL — ABNORMAL HIGH (ref 79–97)
MONOS ABS: 0.9 10*3/uL (ref 0.1–0.9)
Monocytes: 12 %
NEUTROS ABS: 5 10*3/uL (ref 1.4–7.0)
NEUTROS PCT: 67 %
PLATELETS: 277 10*3/uL (ref 150–379)
RBC: 3.94 x10E6/uL — ABNORMAL LOW (ref 4.14–5.80)
RDW: 13.3 % (ref 12.3–15.4)
WBC: 7.5 10*3/uL (ref 3.4–10.8)

## 2017-03-02 LAB — COMPREHENSIVE METABOLIC PANEL
A/G RATIO: 1.4 (ref 1.2–2.2)
ALT: 9 IU/L (ref 0–44)
AST: 12 IU/L (ref 0–40)
Albumin: 3.8 g/dL (ref 3.6–4.8)
Alkaline Phosphatase: 96 IU/L (ref 39–117)
BILIRUBIN TOTAL: 0.2 mg/dL (ref 0.0–1.2)
BUN/Creatinine Ratio: 14 (ref 10–24)
BUN: 18 mg/dL (ref 8–27)
CHLORIDE: 100 mmol/L (ref 96–106)
CO2: 22 mmol/L (ref 18–29)
Calcium: 8.9 mg/dL (ref 8.6–10.2)
Creatinine, Ser: 1.25 mg/dL (ref 0.76–1.27)
GFR calc Af Amer: 70 mL/min/{1.73_m2} (ref >59)
GFR calc non Af Amer: 61 mL/min/{1.73_m2} (ref >59)
GLOBULIN, TOTAL: 2.7 g/dL (ref 1.5–4.5)
Glucose: 89 mg/dL (ref 65–99)
POTASSIUM: 4.3 mmol/L (ref 3.5–5.2)
SODIUM: 139 mmol/L (ref 134–144)
Total Protein: 6.5 g/dL (ref 6.0–8.5)

## 2017-03-02 LAB — LIPID PANEL
CHOL/HDL RATIO: 3.4 ratio (ref 0.0–5.0)
Cholesterol, Total: 112 mg/dL (ref 100–199)
HDL: 33 mg/dL — AB (ref >39)
LDL Calculated: 47 mg/dL (ref 0–99)
TRIGLYCERIDES: 158 mg/dL — AB (ref 0–149)
VLDL Cholesterol Cal: 32 mg/dL (ref 5–40)

## 2017-03-02 LAB — HEMOGLOBIN A1C
Est. average glucose Bld gHb Est-mCnc: 105 mg/dL
HEMOGLOBIN A1C: 5.3 % (ref 4.8–5.6)

## 2017-03-03 ENCOUNTER — Encounter: Payer: Self-pay | Admitting: Internal Medicine

## 2017-03-03 ENCOUNTER — Ambulatory Visit (INDEPENDENT_AMBULATORY_CARE_PROVIDER_SITE_OTHER): Payer: BLUE CROSS/BLUE SHIELD | Admitting: *Deleted

## 2017-03-03 DIAGNOSIS — J449 Chronic obstructive pulmonary disease, unspecified: Secondary | ICD-10-CM | POA: Diagnosis not present

## 2017-03-03 NOTE — Progress Notes (Signed)
SMW performed today. 

## 2017-03-04 ENCOUNTER — Telehealth: Payer: Self-pay | Admitting: Family Medicine

## 2017-03-04 NOTE — Telephone Encounter (Signed)
Incoming fax from [facility] stating that we must submit pre and post spiromerty test results before this patient can be scheduled with them. I do not see that we have these results. Do we need to do a FT visit to preform this test?  Their office number is [phone] for any questions. Results to be faxed to [phone]

## 2017-03-04 NOTE — Telephone Encounter (Signed)
Spoke with [facility].  I do not see that spirometry has been done on this patient, though I do see an order from 2016 that remains active/open.  Angela Nevin will pass on the information. She thinks that they can do this there, and will let us know if that is not the case.

## 2017-03-07 ENCOUNTER — Ambulatory Visit (INDEPENDENT_AMBULATORY_CARE_PROVIDER_SITE_OTHER): Payer: BLUE CROSS/BLUE SHIELD | Admitting: Pulmonary Disease

## 2017-03-07 ENCOUNTER — Encounter: Payer: Self-pay | Admitting: Pulmonary Disease

## 2017-03-07 VITALS — BP 110/66 | HR 104 | Wt 146.0 lb

## 2017-03-07 DIAGNOSIS — F17200 Nicotine dependence, unspecified, uncomplicated: Secondary | ICD-10-CM | POA: Diagnosis not present

## 2017-03-07 DIAGNOSIS — R0902 Hypoxemia: Secondary | ICD-10-CM | POA: Diagnosis not present

## 2017-03-07 DIAGNOSIS — J42 Unspecified chronic bronchitis: Secondary | ICD-10-CM | POA: Diagnosis not present

## 2017-03-07 DIAGNOSIS — G4734 Idiopathic sleep related nonobstructive alveolar hypoventilation: Secondary | ICD-10-CM | POA: Diagnosis not present

## 2017-03-07 DIAGNOSIS — J432 Centrilobular emphysema: Secondary | ICD-10-CM

## 2017-03-07 DIAGNOSIS — J449 Chronic obstructive pulmonary disease, unspecified: Secondary | ICD-10-CM | POA: Diagnosis not present

## 2017-03-07 NOTE — Patient Instructions (Signed)
Continue Advair and Spiriva Continue albuterol as inhaler or nebulizer as needed Smoking cessation as we discussed We will arrange oxygen therapy Follow up in 4 months

## 2017-03-08 NOTE — Progress Notes (Signed)
PULMONARY OFFICE FOLLOW UP NOTE  Requesting MD/Service: [name], MD Date of initial consultation: 08/10/16 Reason for consultation: COPD, smoker  PT PROFILE: 47 M smoker with severe emphysema seen by multiple pulmonologists in past ([place], [place], [place], [place]) referred for further eval and mgmt of COPD   DATA: 07/24/12 Office Spirometry: Severe obstruction (FEV1 31% pred) 08/31/13 Office Spirometry: very severe obstruction (FEV1 25% pred) 06/18/15 CT chest: Severe emphysema 07/27/16 CXR: Severe hyperinflation and hyperlucency 03/03/17 Overnight oximetry: Lowest SpO2 72%. 95% of time SpO2 was below 90% 03/03/17 6MWT: 384 meters. Desat to 84%  INTERVAL: Seen as acute visit 02/03/17 by DK with increased wheezing, dyspnea. Treated with more prednisone and Augmentin. Counseled re: smoking cessation. This F/U was arranged. ONO and 6MWT ordered   SUBJ: Continues to have class III dyspnea. Continues to smoke approx 1/2 PPD. Working on cessation - NRT.  Continues to have mild intermittent cough with occasional mucus production. No new complaints. Here to review results of ONO and 6MWT  OBJ: Vitals:   03/07/17 1613  BP: 110/66  Pulse: (!) 104  SpO2: 93%  Weight: 146 lb (66.2 kg)  RA  EXAM:  Gen: Appears much older than his true age, No overt respiratory distress HEENT: NCAT, sclera white, oropharynx normal Neck: Supple without LAN, thyromegaly, JVD Lungs: breath sounds moderately diminished, no wheezes Cardiovascular: RRR, no murmurs noted Abdomen: Soft, nontender, normal BS Ext: without clubbing, cyanosis, edema Neuro: CNs grossly intact, motor and sensory intact  DATA:   BMP Latest Ref Rng & Units 03/01/2017 12/01/2016 11/29/2016  Glucose 65 - 99 mg/dL 89 99 124(H)  BUN 8 - 27 mg/dL 18 12 8   Creatinine 0.76 - 1.27 mg/dL 1.25 0.83 0.87  BUN/Creat Ratio 10 - 24 14 - -  Sodium 134 - 144 mmol/L 139 138 140  Potassium 3.5 - 5.2 mmol/L 4.3 3.9 3.5  Chloride 96 - 106  mmol/L 100 100(L) 101  CO2 18 - 29 mmol/L 22 31 31   Calcium 8.6 - 10.2 mg/dL 8.9 9.3 9.4    CBC Latest Ref Rng & Units 03/01/2017 12/01/2016 11/05/2016  WBC 3.4 - 10.8 x10E3/uL 7.5 9.1 6.7  Hemoglobin 13.0 - 18.0 g/dL - 15.1 -  Hematocrit 37.5 - 51.0 % 40.1 45.2 44.4  Platelets 150 - 379 x10E3/uL 277 248 217   ONO, 6 MWT as above CXR:  NNF  IMPRESSION:      1. COPD, very severe  2. Centrilobular emphysema   3. Recalcitrant smoker  4. Oxygen desaturation with exertion  5. Nocturnal hypoxemia  6. Chronic bronchitis     PLAN:  1) Again  counseled re: smoking cessation  2) Continue Advair and Spiriva as well as PRN albuterol 3) Supplemental O2 with sleep and exertion 4) ROV 4 months   Merton Border, MD PCCM service Mobile [phone] Pager [phone] 03/08/2017

## 2017-03-11 ENCOUNTER — Other Ambulatory Visit: Payer: Self-pay

## 2017-03-11 DIAGNOSIS — J441 Chronic obstructive pulmonary disease with (acute) exacerbation: Secondary | ICD-10-CM

## 2017-03-11 MED ORDER — IPRATROPIUM-ALBUTEROL 0.5-2.5 (3) MG/3ML IN SOLN: 3.0000 mL | Freq: Four times a day (QID) | RESPIRATORY_TRACT | 3 refills | Status: DC | PRN

## 2017-03-11 NOTE — Telephone Encounter (Signed)
Fax req [facility] Ipratr-Albuterol inh sol 0.5/3mg  sent

## 2017-03-15 NOTE — Addendum Note (Signed)
Addended by: [name] on: 03/15/2017 10:35 PM   Modules accepted: Orders

## 2017-03-22 ENCOUNTER — Encounter: Payer: BLUE CROSS/BLUE SHIELD | Attending: Family Medicine | Admitting: Respiratory Therapy

## 2017-03-22 VITALS — Ht 69.0 in | Wt 149.9 lb

## 2017-03-22 DIAGNOSIS — J449 Chronic obstructive pulmonary disease, unspecified: Secondary | ICD-10-CM | POA: Insufficient documentation

## 2017-03-28 ENCOUNTER — Telehealth: Payer: Self-pay | Admitting: Family Medicine

## 2017-03-28 MED ORDER — ALBUTEROL SULFATE HFA 108 (90 BASE) MCG/ACT IN AERS: INHALATION_SPRAY | RESPIRATORY_TRACT | 0 refills | Status: DC

## 2017-03-28 NOTE — Telephone Encounter (Signed)
Pt is needing a refill on a rescue inhaler and he thought this was requested last week but nothing in system as far as a request he states that this is [facility]   Best number [phone]

## 2017-04-04 DIAGNOSIS — J449 Chronic obstructive pulmonary disease, unspecified: Secondary | ICD-10-CM

## 2017-04-04 NOTE — Progress Notes (Signed)
Daily Session Note  Patient Details  Name: Leslie Sims MRN: [id number] Date of Birth: March 15, 1951 Referring Provider:     Pulmonary Rehab from 03/22/2017 in [facility] Card[facility] Pulmonary Rehab  Referring Provider  Tamala Julian      Encounter Date: 04/04/2017  Check In:     Session Check In - 04/04/17 1237      Check-In   Location [facility]-Cardiac & Pulmonary Rehab   Staff Present Carson Myrtle, BS, RRT, Respiratory Dareen Piano, BA, ACSM CEP, Exercise Physiologist;Kelly Amedeo Plenty, BS, ACSM CEP, Exercise Physiologist   Supervising physician immediately available to respond to emergencies [facility] immediately available ER MD   Physician(s) Mable Paris and K[place]   Medication changes reported     No   Fall or balance concerns reported    No   Tobacco Cessation No Change   Warm-up and Cool-down Performed as group-led instruction   Resistance Training Performed Yes   VAD Patient? No     Pain Assessment   Currently in Pain? No/denies   Multiple Pain Sites No         History  Smoking Status  . Current Every Day Smoker  . Packs/day: 0.50  . Years: 42.00  . Types: Cigarettes  Smokeless Tobacco  . Never Used    Comment: smoking 15-18 cigarettes daily    Goals Met:  Proper associated with RPD/PD & O2 Sat Independence with exercise equipment Exercise tolerated well Strength training completed today  Goals Unmet:  Not Applicable  Comments: First full day of exercise!  Patient was oriented to gym and equipment including functions, settings, policies, and procedures.  Patient's individual exercise prescription and treatment plan were reviewed.  All starting workloads were established based on the results of the 6 minute walk test done at initial orientation visit.  The plan for exercise progression was also introduced and progression will be customized based on patient's performance and goals.    Dr. Emily Filbert is Medical Director for [facility] and  [facility].

## 2017-04-06 ENCOUNTER — Other Ambulatory Visit: Payer: Self-pay | Admitting: Family Medicine

## 2017-04-13 ENCOUNTER — Telehealth: Payer: Self-pay | Admitting: *Deleted

## 2017-04-13 NOTE — Telephone Encounter (Signed)
Called to check on status of return, out since first visit last week. Left message on VM.

## 2017-04-15 ENCOUNTER — Telehealth: Payer: Self-pay | Admitting: *Deleted

## 2017-04-15 NOTE — Telephone Encounter (Signed)
Leslie Sims returned our call and plans to return on Monday.

## 2017-04-18 DIAGNOSIS — J449 Chronic obstructive pulmonary disease, unspecified: Secondary | ICD-10-CM | POA: Diagnosis not present

## 2017-04-18 NOTE — Progress Notes (Signed)
Daily Session Note  Patient Details  Name: Leslie Sims MRN: [id number] Date of Birth: 19-Dec-1951 Referring Provider:     Pulmonary Rehab from 03/22/2017 in [facility] Cardiac and Pulmonary Rehab  Referring Provider  Tamala Julian      Encounter Date: 04/18/2017  Check In:     Session Check In - 04/18/17 1517      Check-In   Location [facility]-Cardiac & Pulmonary Rehab   Staff Present Carson Myrtle, BS, RRT, Respiratory Therapist;Kelly Amedeo Plenty, BS, ACSM CEP, Exercise Physiologist;Amanda Oletta Darter, BA, ACSM CEP, Exercise Physiologist   Supervising physician immediately available to respond to emergencies [facility] immediately available ER MD   Physician(s) Clearnce Hasten and Corky Downs   Medication changes reported     No   Fall or balance concerns reported    No   Warm-up and Cool-down Performed as group-led instruction   Resistance Training Performed Yes   VAD Patient? No     Pain Assessment   Currently in Pain? No/denies         History  Smoking Status  . Current Every Day Smoker  . Packs/day: 0.50  . Years: 42.00  . Types: Cigarettes  Smokeless Tobacco  . Never Used    Comment: smoking 15-18 cigarettes daily    Goals Met:  Proper associated with RPD/PD & O2 Sat Independence with exercise equipment Exercise tolerated well Strength training completed today  Goals Unmet:  Not Applicable  Comments: Pt able to follow exercise prescription today without complaint.  Will continue to monitor for progression.    Dr. Emily Filbert is Medical Director for [facility] and [facility].

## 2017-04-20 ENCOUNTER — Encounter: Payer: BLUE CROSS/BLUE SHIELD | Attending: Family Medicine

## 2017-04-20 DIAGNOSIS — J449 Chronic obstructive pulmonary disease, unspecified: Secondary | ICD-10-CM | POA: Insufficient documentation

## 2017-04-25 ENCOUNTER — Encounter: Payer: Self-pay | Admitting: Respiratory Therapy

## 2017-04-25 DIAGNOSIS — J449 Chronic obstructive pulmonary disease, unspecified: Secondary | ICD-10-CM

## 2017-04-25 NOTE — Progress Notes (Signed)
Pulmonary Individual Treatment Plan  Patient Details  Name: Leslie Sims MRN: [id number] Date of Birth: 1951/02/04 Referring Provider:     Pulmonary Rehab from 03/22/2017 in [facility] Cardiac and Pulmonary Rehab  Referring Provider  Tamala Julian      Initial Encounter Date:    Pulmonary Rehab from 03/22/2017 in [facility] Cardiac and Pulmonary Rehab  Date  03/22/17  Referring Provider  Tamala Julian      Visit Diagnosis: Chronic obstructive pulmonary disease, unspecified COPD type ([place])  Patient's Home Medications on Admission:  Current Outpatient Prescriptions:    albuterol (PROVENTIL) (2.5 MG/3ML) 0.083% nebulizer solution, Take 3 mLs (2.5 mg total) by nebulization every 6 (six) hours as needed for wheezing or shortness of breath., Disp: 360 vial, Rfl: 4   albuterol (VENTOLIN HFA) 108 (90 Base) MCG/ACT inhaler, USE 2 INHALATIONS INTO  THE LUNGS  EVERY 6 HOURS AS NEEDED FOR WHEEZING, Disp: 3 Inhaler, Rfl: 0   aspirin 81 MG tablet, Take 81 mg by mouth daily.  , Disp: , Rfl:    atorvastatin (LIPITOR) 40 MG tablet, Take 1 tablet (40 mg total) by mouth daily., Disp: 90 tablet, Rfl: 3   azelastine (ASTELIN) 0.1 % nasal spray, Place 2 sprays into both nostrils 2 (two) times daily. Use in each nostril as directed, Disp: 30 mL, Rfl: 12   CORTIFOAM 10 % rectal foam, PLACE ONE APPLICATION RECTALLY TWICE DAILY, Disp: 15 g, Rfl: 5   fluticasone (FLONASE) 50 MCG/ACT nasal spray, Place 1 spray into both nostrils 2 (two) times daily., Disp: 48 g, Rfl: 3   Fluticasone-Salmeterol (ADVAIR DISKUS) 500-50 MCG/DOSE AEPB, Inhale 1 puff into the lungs 2 (two) times daily., Disp: 180 each, Rfl: 3   furosemide (LASIX) 20 MG tablet, Take 1 tablet (20 mg total) by mouth daily., Disp: 90 tablet, Rfl: 1   ipratropium-albuterol (DUONEB) 0.5-2.5 (3) MG/3ML SOLN, Take 3 mLs by nebulization every 6 (six) hours as needed., Disp: 270 mL, Rfl: 3   LORazepam (ATIVAN) 0.5 MG tablet, Take 1 tablet (0.5 mg total) by mouth 2 (two)  times daily as needed for anxiety., Disp: 60 tablet, Rfl: 0   losartan (COZAAR) 25 MG tablet, Take 1 tablet (25 mg total) by mouth daily., Disp: 90 tablet, Rfl: 3   Multiple Vitamin (MULTIVITAMIN) capsule, Take 1 capsule by mouth daily., Disp: , Rfl:    nicotine (NICODERM CQ - DOSED IN MG/24 HOURS) 21 mg/24hr patch, Place 1 patch (21 mg total) onto the skin daily., Disp: 28 patch, Rfl: 12   nicotine polacrilex (NICORETTE) 4 MG gum, Take 1 each (4 mg total) by mouth as needed for smoking cessation., Disp: 220 tablet, Rfl: 0   NICOTROL 10 MG inhaler, INHALE CONTENTS OF 1 CARTRIDGE (CONTINUOUS PUFFING) INTO THE LUNGS AS NEEDED FOR SMOKING CESSATION (, Disp: 168 each, Rfl: 4   nitroGLYCERIN (NITROSTAT) 0.4 MG SL tablet, Place 1 tablet (0.4 mg total) under the tongue every 5 (five) minutes as needed., Disp: 25 tablet, Rfl: 3   PARoxetine (PAXIL-CR) 25 MG 24 hr tablet, TAKE ONE TABLET BY MOUTH EVERY DAY, Disp: 30 tablet, Rfl: 5   sodium chloride (OCEAN) 0.65 % SOLN nasal spray, Place 2 sprays into both nostrils every 2 (two) hours while awake., Disp: 88 mL, Rfl: 5   sucralfate (CARAFATE) 1 GM/10ML suspension, TAKE 10ML( 2 TEASPOONSFUL) BY MOUTH FOUR TIMES DAILY, Disp: 3600 mL, Rfl: 1   tiotropium (SPIRIVA HANDIHALER) 18 MCG inhalation capsule, Place 1 capsule (18 mcg total) into inhaler and inhale daily.,  Disp: 90 capsule, Rfl: 3  Past Medical History: Past Medical History:  Diagnosis Date   Allergic rhinitis    Anxiety    Atherosclerosis    Back pain    Bronchitis    09-14-11   CARDIOMYOPATHY 03/24/2010   did not tolerate Toprol and Lisinopril.    Chest pain    Chronic airway obstruction, not elsewhere classified    Colon polyps 08/2012   colonoscopy; multiple colon polyps; repeat colonoscopy in one year.   COPD (chronic obstructive pulmonary disease) ([place])    Coronary artery disease 11/2009   Anterior MI with late presentation 11/2009. Cath showed a 100% proximal LAD  occlusion, 99% mid RCA. Had a PCI and 2 DES to both LAD and RCA. EF was 35%. Nuclear stress test 05/13: prior anterior/inferior infarcts without ischemic, EF 43%, cardiac cath in 02/14: Widely patent stents with no other obstructive disease. EF 40%    Depression    Diabetes mellitus without complication (HCC)    GERD (gastroesophageal reflux disease)    JOINOMVE(720.9)    Helicobacter pylori (H. pylori)    Hemorrhoids    Hernia    inguinal   Hyperlipidemia    Hypertension    Insomnia    MI (myocardial infarction)    Myalgia    Peptic ulcer    Shortness of breath dyspnea    Status post dilation of esophageal narrowing 2000   Thyroid dysfunction    Tinea pedis    Wears dentures    partial upper    Tobacco Use: History  Smoking Status   Current Every Day Smoker   Packs/day: 0.50   Years: 42.00   Types: Cigarettes  Smokeless Tobacco   Never Used    Comment: smoking 15-18 cigarettes daily    Labs: Recent Review Flowsheet Data    Labs for ITP Cardiac and Pulmonary Rehab Latest Ref Rng & Units 10/20/2015 03/02/2016 06/15/2016 08/24/2016 03/01/2017   Cholestrol 100 - 199 mg/dL 165 140 132 123(L) 112   LDLCALC 0 - 99 mg/dL 90 53 40 30 47   HDL >39 mg/dL 54 73 69 68 33(L)   Trlycerides 0 - 149 mg/dL 103 71 115 126 158(H)   Hemoglobin A1c 4.8 - 5.6 % 6.5(H) 6.1(H) 6.3 5.9(H) 5.3       ADL UCSD:     Pulmonary Assessment Scores    Row Name 03/22/17 1519         ADL UCSD   ADL Phase Entry     SOB Score total 37     Rest 0     Walk 2     Stairs 3     Bath 2     Dress 1     Shop 3        Pulmonary Function Assessment:     Pulmonary Function Assessment - 03/22/17 1517      Initial Spirometry Results   FVC% 31 %   FEV1% 20 %   FEV1/FVC Ratio 47.76     Post Bronchodilator Spirometry Results   FVC% 56 %   FEV1% 30 %   FEV1/FVC Ratio 42.23     Breath   Shortness of Breath Yes;Limiting activity;Fear of Shortness of Breath       Exercise Target Goals:    Exercise Program Goal: Individual exercise prescription set with THRR, safety & activity barriers. Participant demonstrates ability to understand and report RPE using BORG scale, to self-measure pulse accurately, and to acknowledge the importance of the exercise  prescription.  Exercise Prescription Goal: Starting with aerobic activity 30 plus minutes a day, 3 days per week for initial exercise prescription. Provide home exercise prescription and guidelines that participant acknowledges understanding prior to discharge.  Activity Barriers & Risk Stratification:   6 Minute Walk:     6 Minute Walk    Row Name 03/22/17 1532         6 Minute Walk   Distance 1050 feet     Walk Time 4.5 minutes     # of Rest Breaks 3     MPH 2.65     METS 2.5     RPE 13     Perceived Dyspnea  4     VO2 Peak 8.74     Symptoms Yes (comment)     Comments Gets nervous and shortof breath     Resting HR 102 bpm     Resting BP 128/58     Max Ex. HR 125 bpm     Max Ex. BP 142/52       Interval HR   Baseline HR 102     2 Minute HR 105     4 Minute HR 102     5 Minute HR 110     6 Minute HR 125     2 Minute Post HR 94     Interval Heart Rate? Yes       Interval Oxygen   Interval Oxygen? Yes     Baseline Oxygen Saturation % 93 %     2 Minute Oxygen Saturation % 89 %     4 Minute Oxygen Saturation % 92 %     5 Minute Oxygen Saturation % 90 %     6 Minute Oxygen Saturation % 89 %     2 Minute Post Oxygen Saturation % 94 %       Oxygen Initial Assessment:     Oxygen Initial Assessment - 03/22/17 1529      Home Oxygen   Home Oxygen Device Portable Concentrator;E-Tanks   Sleep Oxygen Prescription Continuous   Liters per minute 2   Home Exercise Oxygen Prescription Continuous   Liters per minute 2  as needed   Home at Rest Exercise Oxygen Prescription Continuous   Liters per minute 2   Compliance with Home Oxygen Use Yes     Initial 6 min Walk   Oxygen  Used Continuous;E-Tanks   Liters per minute 2   Resting Oxygen Saturation  during 6 min walk 93 %   Exercise Oxygen Saturation  during 6 min walk 89 %     Program Oxygen Prescription   Program Oxygen Prescription Continuous;E-Tanks   Liters per minute 2     Intervention   Short Term Goals To learn and exhibit compliance with exercise, home and travel O2 prescription;To learn and understand importance of monitoring SPO2 with pulse oximeter and demonstrate accurate use of the pulse oximeter.;To Learn and understand importance of maintaining oxygen saturations>88%;To learn and demonstrate proper purse lipped breathing techniques or other breathing techniques.;To learn and demonstrate proper use of respiratory medications   Long  Term Goals Exhibits compliance with exercise, home and travel O2 prescription;Verbalizes importance of monitoring SPO2 with pulse oximeter and return demonstration;Maintenance of O2 saturations>88%;Exhibits proper breathing techniques, such as purse lipped breathing or other method taught during program session;Compliance with respiratory medication;Demonstrates proper use of MDIs      Oxygen Re-Evaluation:   Oxygen Discharge (Final Oxygen Re-Evaluation):   Initial Exercise Prescription:  Initial Exercise Prescription - 03/22/17 1500      Date of Initial Exercise RX and Referring Provider   Date 03/22/17   Referring Provider Tamala Julian     Treadmill   MPH 2   Grade 0   Minutes 15  3/3/3   METs 2.5     Recumbant Bike   Level 2   RPM 60   Minutes 15   METs 2.5     NuStep   Level 3   SPM 80   Minutes 15   METs 2.5     Recumbant Elliptical   Level 1   RPM 50   Minutes 15   METs 2.5     T5 Nustep   Level 2   SPM 60   Minutes 15   METs 2.5     Prescription Details   Frequency (times per week) 3   Duration Progress to 45 minutes of aerobic exercise without signs/symptoms of physical distress     Intensity   THRR 40-80% of Max  Heartrate 124-146   Ratings of Perceived Exertion 11-13   Perceived Dyspnea 0-4     Progression   Progression Continue to progress workloads to maintain intensity without signs/symptoms of physical distress.     Resistance Training   Training Prescription Yes   Weight 3   Reps 10-15      Perform Capillary Blood Glucose checks as needed.  Exercise Prescription Changes:     Exercise Prescription Changes    Row Name 04/13/17 1400             Response to Exercise   Blood Pressure (Admit) 110/58       Blood Pressure (Exercise) 120/60       Blood Pressure (Exit) 100/60       Heart Rate (Admit) 82 bpm       Heart Rate (Exercise) 107 bpm       Heart Rate (Exit) 80 bpm       Oxygen Saturation (Admit) 96 %       Oxygen Saturation (Exercise) 92 %       Oxygen Saturation (Exit) 95 %       Rating of Perceived Exertion (Exercise) 11       Perceived Dyspnea (Exercise) 2       Symptoms none       Comments first full day of exercise results       Duration Progress to 45 minutes of aerobic exercise without signs/symptoms of physical distress       Intensity THRR unchanged         Progression   Progression Continue to progress workloads to maintain intensity without signs/symptoms of physical distress.       Average METs 2.17         Resistance Training   Training Prescription Yes       Weight 5 lbs       Reps 10-15         Interval Training   Interval Training No         Treadmill   MPH 2       Grade 0       METs 2.53         NuStep   Level 3       SPM 71       Minutes 15       METs 2.3         Recumbant Elliptical   Level 1  RPM 50       Minutes 15       METs 1.7          Exercise Comments:     Exercise Comments    Row Name 04/04/17 1241           Exercise Comments First full day of exercise!  Patient was oriented to gym and equipment including functions, settings, policies, and procedures.  Patient's individual exercise prescription and  treatment plan were reviewed.  All starting workloads were established based on the results of the 6 minute walk test done at initial orientation visit.  The plan for exercise progression was also introduced and progression will be customized based on patient's performance and goals.          Exercise Goals and Review:     Exercise Goals    Row Name 03/22/17 1535             Exercise Goals   Increase Physical Activity Yes       Intervention Provide advice, education, support and counseling about physical activity/exercise needs.;Develop an individualized exercise prescription for aerobic and resistive training based on initial evaluation findings, risk stratification, comorbidities and participant's personal goals.       Expected Outcomes Achievement of increased cardiorespiratory fitness and enhanced flexibility, muscular endurance and strength shown through measurements of functional capacity and personal statement of participant.       Increase Strength and Stamina Yes       Intervention Provide advice, education, support and counseling about physical activity/exercise needs.;Develop an individualized exercise prescription for aerobic and resistive training based on initial evaluation findings, risk stratification, comorbidities and participant's personal goals.       Expected Outcomes Achievement of increased cardiorespiratory fitness and enhanced flexibility, muscular endurance and strength shown through measurements of functional capacity and personal statement of participant.          Exercise Goals Re-Evaluation :     Exercise Goals Re-Evaluation    [place] Name 04/13/17 1403             Exercise Goal Re-Evaluation   Exercise Goals Review Increase Physical Activity;Increase Strenth and Stamina       Comments Daltin has completed his first full day of exercise.  He has not been back, but we will continue to track his progress.       Expected Outcomes Short: Come to classes  more consisitently.  Long: Work on Animator.          Discharge Exercise Prescription (Final Exercise Prescription Changes):     Exercise Prescription Changes - 04/13/17 1400      Response to Exercise   Blood Pressure (Admit) 110/58   Blood Pressure (Exercise) 120/60   Blood Pressure (Exit) 100/60   Heart Rate (Admit) 82 bpm   Heart Rate (Exercise) 107 bpm   Heart Rate (Exit) 80 bpm   Oxygen Saturation (Admit) 96 %   Oxygen Saturation (Exercise) 92 %   Oxygen Saturation (Exit) 95 %   Rating of Perceived Exertion (Exercise) 11   Perceived Dyspnea (Exercise) 2   Symptoms none   Comments first full day of exercise results   Duration Progress to 45 minutes of aerobic exercise without signs/symptoms of physical distress   Intensity THRR unchanged     Progression   Progression Continue to progress workloads to maintain intensity without signs/symptoms of physical distress.   Average METs 2.17     Resistance Training  Training Prescription Yes   Weight 5 lbs   Reps 10-15     Interval Training   Interval Training No     Treadmill   MPH 2   Grade 0   METs 2.53     NuStep   Level 3   SPM 71   Minutes 15   METs 2.3     Recumbant Elliptical   Level 1   RPM 50   Minutes 15   METs 1.7      Nutrition:  Target Goals: Understanding of nutrition guidelines, daily intake of sodium 1500mg , cholesterol 200mg , calories 30% from fat and 7% or less from saturated fats, daily to have 5 or more servings of fruits and vegetables.  Biometrics:     Pre Biometrics - 03/22/17 1532      Pre Biometrics   Height 5\' 9"  (1.753 m)   Weight 149 lb 14.4 oz (68 kg)   Waist Circumference 37 inches   Hip Circumference 38.25 inches   Waist to Hip Ratio 0.97 %   BMI (Calculated) 22.2       Nutrition Therapy Plan and Nutrition Goals:   Nutrition Discharge: Rate Your Plate Scores:   Nutrition Goals Re-Evaluation:   Nutrition Goals Discharge (Final  Nutrition Goals Re-Evaluation):   Psychosocial: Target Goals: Acknowledge presence or absence of significant depression and/or stress, maximize coping skills, provide positive support system. Participant is able to verbalize types and ability to use techniques and skills needed for reducing stress and depression.   Initial Review & Psychosocial Screening:     Initial Psych Review & Screening - 03/22/17 1531      Barriers   Psychosocial barriers to participate in program The patient should benefit from training in stress management and relaxation.     Screening Interventions   Interventions Encouraged to exercise;Program counselor consult      Quality of Life Scores:     Quality of Life - 03/22/17 1529      Quality of Life Scores   Health/Function Pre 21 %   Socioeconomic Pre 21 %   Psych/Spiritual Pre 21 %   Family Pre 21 %   GLOBAL Pre 21 %      PHQ-9: Recent Review Flowsheet Data    Depression screen [facility] 2/9 03/22/2017 01/15/2017 12/15/2016 11/18/2016 10/27/2016   Decreased Interest 2 0 1 3 2    Down, Depressed, Hopeless 2 0 1 3 2    PHQ - 2 Score 4 0 2 6 4    Altered sleeping 2 - 0 2 3   Tired, decreased energy 2 - 3 2 3    Change in appetite 2 - 0 2 2   Feeling bad or failure about yourself  2 - 0 3 1   Trouble concentrating 2 - 3 3 2    Moving slowly or fidgety/restless 2 - 3 0 1   Suicidal thoughts 0 - 0 0 -   PHQ-9 Score 16 - 11 18 16    Difficult doing work/chores Somewhat difficult - - - -     Interpretation of Total Score  Total Score Depression Severity:  1-4 = Minimal depression, 5-9 = Mild depression, 10-14 = Moderate depression, 15-19 = Moderately severe depression, 20-27 = Severe depression   Psychosocial Evaluation and Intervention:   Psychosocial Re-Evaluation:   Psychosocial Discharge (Final Psychosocial Re-Evaluation):   Education: Education Goals: Education classes will be provided on a weekly basis, covering required topics. Participant will  state understanding/return demonstration of topics presented.  Learning Barriers/Preferences:  Learning Barriers/Preferences - 03/22/17 1517      Learning Barriers/Preferences   Learning Barriers None   Learning Preferences None      Education Topics: Initial Evaluation Education: - Verbal, written and demonstration of respiratory meds, RPE/PD scales, oximetry and breathing techniques. Instruction on use of nebulizers and MDIs: cleaning and proper use, rinsing mouth with steroid doses and importance of monitoring MDI activations.   Pulmonary Rehab from 03/22/2017 in [facility] Cardiac and Pulmonary Rehab  Date  03/22/17  Educator  LB  Instruction Review Code  2- meets goals/outcomes      General Nutrition Guidelines/Fats and Fiber: -Group instruction provided by verbal, written material, models and posters to present the general guidelines for heart healthy nutrition. Gives an explanation and review of dietary fats and fiber.   Pulmonary Rehab from 04/18/2017 in [facility] Cardiac and Pulmonary Rehab  Date  04/18/17  Educator  CR  Instruction Review Code  2- meets goals/outcomes      Controlling Sodium/Reading Food Labels: -Group verbal and written material supporting the discussion of sodium use in heart healthy nutrition. Review and explanation with models, verbal and written materials for utilization of the food label.   Exercise Physiology & Risk Factors: - Group verbal and written instruction with models to review the exercise physiology of the cardiovascular system and associated critical values. Details cardiovascular disease risk factors and the goals associated with each risk factor.   Aerobic Exercise & Resistance Training: - Gives group verbal and written discussion on the health impact of inactivity. On the components of aerobic and resistive training programs and the benefits of this training and how to safely progress through these programs.   Flexibility, Balance,  General Exercise Guidelines: - Provides group verbal and written instruction on the benefits of flexibility and balance training programs. Provides general exercise guidelines with specific guidelines to those with heart or lung disease. Demonstration and skill practice provided.   Stress Management: - Provides group verbal and written instruction about the health risks of elevated stress, cause of high stress, and healthy ways to reduce stress.   Depression: - Provides group verbal and written instruction on the correlation between heart/lung disease and depressed mood, treatment options, and the stigmas associated with seeking treatment.   Exercise & Equipment Safety: - Individual verbal instruction and demonstration of equipment use and safety with use of the equipment.   Pulmonary Rehab from 04/18/2017 in [facility] Cardiac and Pulmonary Rehab  Date  04/04/17  Educator  AS  Instruction Review Code  2- meets goals/outcomes      Infection Prevention: - Provides verbal and written material to individual with discussion of infection control including proper hand washing and proper equipment cleaning during exercise session.   Pulmonary Rehab from 04/18/2017 in [facility] Cardiac and Pulmonary Rehab  Date  04/04/17  Educator  AS  Instruction Review Code  2- meets goals/outcomes      Falls Prevention: - Provides verbal and written material to individual with discussion of falls prevention and safety.   Diabetes: - Individual verbal and written instruction to review signs/symptoms of diabetes, desired ranges of glucose level fasting, after meals and with exercise. Advice that pre and post exercise glucose checks will be done for 3 sessions at entry of program.   Chronic Lung Diseases: - Group verbal and written instruction to review new updates, new respiratory medications, new advancements in procedures and treatments. Provide informative websites and "800" numbers of self-education.   Lung  Procedures: - Group verbal and  written instruction to describe testing methods done to diagnose lung disease. Review the outcome of test results. Describe the treatment choices: Pulmonary Function Tests, ABGs and oximetry.   Energy Conservation: - Provide group verbal and written instruction for methods to conserve energy, plan and organize activities. Instruct on pacing techniques, use of adaptive equipment and posture/positioning to relieve shortness of breath.   Triggers: - Group verbal and written instruction to review types of environmental controls: home humidity, furnaces, filters, dust mite/pet prevention, HEPA vacuums. To discuss weather changes, air quality and the benefits of nasal washing.   Exacerbations: - Group verbal and written instruction to provide: warning signs, infection symptoms, calling MD promptly, preventive modes, and value of vaccinations. Review: effective airway clearance, coughing and/or vibration techniques. Create an Sports administrator.   Oxygen: - Individual and group verbal and written instruction on oxygen therapy. Includes supplement oxygen, available portable oxygen systems, continuous and intermittent flow rates, oxygen safety, concentrators, and Medicare reimbursement for oxygen.   Pulmonary Rehab from 03/22/2017 in [facility] Cardiac and Pulmonary Rehab  Date  03/22/17  Educator  LB  Instruction Review Code  2- meets goals/outcomes      Respiratory Medications: - Group verbal and written instruction to review medications for lung disease. Drug class, frequency, complications, importance of spacers, rinsing mouth after steroid MDI's, and proper cleaning methods for nebulizers.   Pulmonary Rehab from 03/22/2017 in [facility] Cardiac and Pulmonary Rehab  Date  03/22/17  Educator  LB  Instruction Review Code  2- meets goals/outcomes      AED/CPR: - Group verbal and written instruction with the use of models to demonstrate the basic use of the AED with the basic ABC's  of resuscitation.   Breathing Retraining: - Provides individuals verbal and written instruction on purpose, frequency, and proper technique of diaphragmatic breathing and pursed-lipped breathing. Applies individual practice skills.   Pulmonary Rehab from 03/22/2017 in [facility] Cardiac and Pulmonary Rehab  Date  03/22/17  Educator  LB  Instruction Review Code  2- meets goals/outcomes      Anatomy and Physiology of the Lungs: - Group verbal and written instruction with the use of models to provide basic lung anatomy and physiology related to function, structure and complications of lung disease.   Heart Failure: - Group verbal and written instruction on the basics of heart failure: signs/symptoms, treatments, explanation of ejection fraction, enlarged heart and cardiomyopathy.   Sleep Apnea: - Individual verbal and written instruction to review Obstructive Sleep Apnea. Review of risk factors, methods for diagnosing and types of masks and machines for OSA.   Anxiety: - Provides group, verbal and written instruction on the correlation between heart/lung disease and anxiety, treatment options, and management of anxiety.   Relaxation: - Provides group, verbal and written instruction about the benefits of relaxation for patients with heart/lung disease. Also provides patients with examples of relaxation techniques.   Knowledge Questionnaire Score:     Knowledge Questionnaire Score - 03/22/17 1517      Knowledge Questionnaire Score   Pre Score 10/10       Core Components/Risk Factors/Patient Goals at Admission:     Personal Goals and Risk Factors at Admission - 03/22/17 1521      Core Components/Risk Factors/Patient Goals on Admission    Weight Management Yes;Weight Gain   Intervention Weight Management: Develop a combined nutrition and exercise program designed to reach desired caloric intake, while maintaining appropriate intake of nutrient and fiber, sodium and fats, and  appropriate energy expenditure  required for the weight goal.;Weight Management: Provide education and appropriate resources to help participant work on and attain dietary goals.   Admit Weight 150 lb (68 kg)   Goal Weight: Short Term 155 lb (70.3 kg)   Goal Weight: Long Term 160 lb (72.6 kg)   Expected Outcomes Weight Gain: Understanding of general recommendations for a high calorie, high protein meal plan that promotes weight gain by distributing calorie intake throughout the day with the consumption for 4-5 meals, snacks, and/or supplements;Short Term: Continue to assess and modify interventions until short term weight is achieved;Long Term: Adherence to nutrition and physical activity/exercise program aimed toward attainment of established weight goal   Tobacco Cessation Yes   Number of packs per day 1   Intervention Assist the participant in steps to quit. Provide individualized education and counseling about committing to Tobacco Cessation, relapse prevention, and pharmacological support that can be provided by physician.;Advice worker, assist with locating and accessing local/national Quit Smoking programs, and support quit date choice.  Using nicotine gum and inhaler   Expected Outcomes Short Term: Will demonstrate readiness to quit, by selecting a quit date.;Short Term: Will quit all tobacco product use, adhering to prevention of relapse plan.;Long Term: Complete abstinence from all tobacco products for at least 12 months from quit date.   Improve shortness of breath with ADL's Yes   Intervention Provide education, individualized exercise plan and daily activity instruction to help decrease symptoms of SOB with activities of daily living.   Expected Outcomes Short Term: Achieves a reduction of symptoms when performing activities of daily living.   Develop more efficient breathing techniques such as purse lipped breathing and diaphragmatic breathing; and practicing self-pacing  with activity Yes  Advair, Spiriva, Ventolin, MDI; Duoneb and Ventolin SVN   Intervention Provide education, demonstration and support about specific breathing techniuqes utilized for more efficient breathing. Include techniques such as pursed lipped breathing, diaphragmatic breathing and self-pacing activity.   Expected Outcomes Short Term: Participant will be able to demonstrate and use breathing techniques as needed throughout daily activities.   Increase knowledge of respiratory medications and ability to use respiratory devices properly  Yes   Intervention Provide education and demonstration as needed of appropriate use of medications, inhalers, and oxygen therapy.   Expected Outcomes Short Term: Achieves understanding of medications use. Understands that oxygen is a medication prescribed by physician. Demonstrates appropriate use of inhaler and oxygen therapy.   Hypertension Yes   Intervention Provide education on lifestyle modifcations including regular physical activity/exercise, weight management, moderate sodium restriction and increased consumption of fresh fruit, vegetables, and low fat dairy, alcohol moderation, and smoking cessation.;Monitor prescription use compliance.   Expected Outcomes Short Term: Continued assessment and intervention until BP is < 140/53mm HG in hypertensive participants. < 130/75mm HG in hypertensive participants with diabetes, heart failure or chronic kidney disease.;Long Term: Maintenance of blood pressure at goal levels.   Lipids Yes   Intervention Provide education and support for participant on nutrition & aerobic/resistive exercise along with prescribed medications to achieve LDL 70mg , HDL >40mg .   Expected Outcomes Short Term: Participant states understanding of desired cholesterol values and is compliant with medications prescribed. Participant is following exercise prescription and nutrition guidelines.;Long Term: Cholesterol controlled with medications as  prescribed, with individualized exercise RX and with personalized nutrition plan. Value goals: LDL < 70mg , HDL > 40 mg.      Core Components/Risk Factors/Patient Goals Review:    Core Components/Risk Factors/Patient Goals at Discharge (Final Review):  ITP Comments:     ITP Comments    Row Name 04/13/17 1407 04/25/17 0905         ITP Comments Called to check on status of return, out since first visit last week.  Left message on VM. 30 day note review with Dr Emily Filbert, Medical Director of [facility]         Comments: 30 day note review with Dr Emily Filbert, Medical Director of [facility]

## 2017-05-02 DIAGNOSIS — J449 Chronic obstructive pulmonary disease, unspecified: Secondary | ICD-10-CM | POA: Diagnosis not present

## 2017-05-02 NOTE — Progress Notes (Signed)
Daily Session Note  Patient Details  Name: Leslie Sims MRN: [id number] Date of Birth: 03-20-1951 Referring Provider:     Pulmonary Rehab from 03/22/2017 in [facility]  Referring Provider  Tamala Julian      Encounter Date: 05/02/2017  Check In:     Session Check In - 05/02/17 1527      Check-In   Location [facility] & [facility]   Staff Present Carson Myrtle, BS, RRT, Respiratory Dareen Piano, BA, ACSM CEP, Exercise Physiologist;Kelly Amedeo Plenty, BS, ACSM CEP, Exercise Physiologist   Supervising physician immediately available to respond to emergencies [facility] immediately available ER MD   Medication changes reported     No   Fall or balance concerns reported    No   Warm-up and Cool-down Performed as group-led instruction   Resistance Training Performed Yes   VAD Patient? No     Pain Assessment   Currently in Pain? No/denies         History  Smoking Status  . Current Every Day Smoker  . Packs/day: 0.50  . Years: 42.00  . Types: Cigarettes  Smokeless Tobacco  . Never Used    Comment: smoking 15-18 cigarettes daily    Goals Met:  Proper associated with RPD/PD & O2 Sat Independence with exercise equipment Exercise tolerated well Strength training completed today  Goals Unmet:  Not Applicable  Comments: Pt able to follow exercise prescription today without complaint.  Will continue to monitor for progression.    Dr. Emily Filbert is Medical Director for [place] and [facility].

## 2017-05-09 DIAGNOSIS — J449 Chronic obstructive pulmonary disease, unspecified: Secondary | ICD-10-CM | POA: Diagnosis not present

## 2017-05-09 NOTE — Progress Notes (Signed)
Daily Session Note  Patient Details  Name: Leslie Sims MRN: [id number] Date of Birth: 08/23/1951 Referring Provider:     Pulmonary Rehab from 03/22/2017 in [facility]  Referring Provider  Tamala Julian      Encounter Date: 05/09/2017  Check In:     Session Check In - 05/09/17 1312      Check-In   Location [facility]   Staff Present Carson Myrtle, BS, RRT, Respiratory Dareen Piano, BA, ACSM CEP, Exercise Physiologist;Kelly Amedeo Plenty, BS, ACSM CEP, Exercise Physiologist   Supervising physician immediately available to respond to emergencies [facility] immediately available ER MD   Physician(s) Jimmye Norman and Corky Downs   Medication changes reported     No   Fall or balance concerns reported    No   Warm-up and Cool-down Performed as group-led instruction   Resistance Training Performed Yes   VAD Patient? No     Pain Assessment   Currently in Pain? No/denies         History  Smoking Status  . Current Every Day Smoker  . Packs/day: 0.50  . Years: 42.00  . Types: Cigarettes  Smokeless Tobacco  . Never Used    Comment: smoking 15-18 cigarettes daily    Goals Met:  Proper associated with RPD/PD & O2 Sat Independence with exercise equipment Exercise tolerated well Strength training completed today  Goals Unmet:  Not Applicable  Comments: Pt able to follow exercise prescription today without complaint.  Will continue to monitor for progression.    Dr. Emily Filbert is Medical Director for [place] and [facility].

## 2017-05-18 ENCOUNTER — Encounter: Payer: Self-pay | Admitting: Respiratory Therapy

## 2017-05-20 ENCOUNTER — Other Ambulatory Visit: Payer: Self-pay | Admitting: Family Medicine

## 2017-05-20 ENCOUNTER — Encounter: Payer: BLUE CROSS/BLUE SHIELD | Attending: Family Medicine

## 2017-05-20 DIAGNOSIS — J449 Chronic obstructive pulmonary disease, unspecified: Secondary | ICD-10-CM | POA: Insufficient documentation

## 2017-05-20 DIAGNOSIS — J441 Chronic obstructive pulmonary disease with (acute) exacerbation: Secondary | ICD-10-CM

## 2017-05-21 NOTE — Telephone Encounter (Signed)
11/2015 last refill  Is quantity asked for ok?  Last ov 03/01/2017

## 2017-05-23 ENCOUNTER — Encounter: Payer: Self-pay | Admitting: Respiratory Therapy

## 2017-05-23 DIAGNOSIS — J449 Chronic obstructive pulmonary disease, unspecified: Secondary | ICD-10-CM

## 2017-05-23 NOTE — Progress Notes (Signed)
Pulmonary Individual Treatment Plan  Patient Details  Name: Leslie Sims MRN: [id number] Date of Birth: Dec 31, 1950 Referring Provider:     Pulmonary Rehab from 03/22/2017 in [facility] Cardiac and Pulmonary Rehab  Referring Provider  Katrinka Blazing      Initial Encounter Date:    Pulmonary Rehab from 03/22/2017 in [facility] Cardiac and Pulmonary Rehab  Date  03/22/17  Referring Provider  Katrinka Blazing      Visit Diagnosis: Chronic obstructive pulmonary disease, unspecified COPD type (HCC)  Patient's Home Medications on Admission:  Current Outpatient Prescriptions:    albuterol (PROVENTIL) (2.5 MG/3ML) 0.083% nebulizer solution, Take 3 mLs (2.5 mg total) by nebulization every 6 (six) hours as needed for wheezing or shortness of breath., Disp: 360 vial, Rfl: 4   albuterol (VENTOLIN HFA) 108 (90 Base) MCG/ACT inhaler, USE 2 INHALATIONS INTO  THE LUNGS  EVERY 6 HOURS AS NEEDED FOR WHEEZING, Disp: 3 Inhaler, Rfl: 0   aspirin 81 MG tablet, Take 81 mg by mouth daily.  , Disp: , Rfl:    atorvastatin (LIPITOR) 40 MG tablet, Take 1 tablet (40 mg total) by mouth daily., Disp: 90 tablet, Rfl: 3   azelastine (ASTELIN) 0.1 % nasal spray, Place 2 sprays into both nostrils 2 (two) times daily. Use in each nostril as directed, Disp: 30 mL, Rfl: 12   CORTIFOAM 10 % rectal foam, PLACE ONE APPLICATION RECTALLY TWICE DAILY, Disp: 15 g, Rfl: 5   fluticasone (FLONASE) 50 MCG/ACT nasal spray, Place 1 spray into both nostrils 2 (two) times daily., Disp: 48 g, Rfl: 3   Fluticasone-Salmeterol (ADVAIR DISKUS) 500-50 MCG/DOSE AEPB, Inhale 1 puff into the lungs 2 (two) times daily., Disp: 180 each, Rfl: 3   furosemide (LASIX) 20 MG tablet, Take 1 tablet (20 mg total) by mouth daily., Disp: 90 tablet, Rfl: 1   ipratropium-albuterol (DUONEB) 0.5-2.5 (3) MG/3ML SOLN, Take 3 mLs by nebulization every 6 (six) hours as needed., Disp: 270 mL, Rfl: 3   LORazepam (ATIVAN) 0.5 MG tablet, Take 1 tablet (0.5 mg total) by mouth 2 (two)  times daily as needed for anxiety., Disp: 60 tablet, Rfl: 0   losartan (COZAAR) 25 MG tablet, Take 1 tablet (25 mg total) by mouth daily., Disp: 90 tablet, Rfl: 3   Multiple Vitamin (MULTIVITAMIN) capsule, Take 1 capsule by mouth daily., Disp: , Rfl:    nicotine (NICODERM CQ - DOSED IN MG/24 HOURS) 21 mg/24hr patch, Place 1 patch (21 mg total) onto the skin daily., Disp: 28 patch, Rfl: 12   nicotine polacrilex (NICORETTE) 4 MG gum, Take 1 each (4 mg total) by mouth as needed for smoking cessation., Disp: 220 tablet, Rfl: 0   NICOTROL 10 MG inhaler, INHALE CONTENTS OF 1 CARTRIDGE (CONTINUOUS PUFFING) INTO THE LUNGS AS NEEDED FOR SMOKING CESSATION (, Disp: 168 each, Rfl: 4   nitroGLYCERIN (NITROSTAT) 0.4 MG SL tablet, Place 1 tablet (0.4 mg total) under the tongue every 5 (five) minutes as needed., Disp: 25 tablet, Rfl: 3   PARoxetine (PAXIL-CR) 25 MG 24 hr tablet, TAKE ONE TABLET BY MOUTH EVERY DAY, Disp: 30 tablet, Rfl: 5   sodium chloride (OCEAN) 0.65 % SOLN nasal spray, Place 2 sprays into both nostrils every 2 (two) hours while awake., Disp: 88 mL, Rfl: 5   sucralfate (CARAFATE) 1 GM/10ML suspension, TAKE ( 2 TEASPOONSFUL) BY MOUTH FOUR TIMES DAILY, Disp: 3600 mL, Rfl: 1   tiotropium (SPIRIVA HANDIHALER) 18 MCG inhalation capsule, Place 1 capsule (18 mcg total) into inhaler and inhale daily.,  Disp: 90 capsule, Rfl: 3  Past Medical History: Past Medical History:  Diagnosis Date   Allergic rhinitis    Anxiety    Atherosclerosis    Back pain    Bronchitis    09-14-11   CARDIOMYOPATHY 03/24/2010   did not tolerate Toprol and Lisinopril.    Chest pain    Chronic airway obstruction, not elsewhere classified    Colon polyps 08/2012   colonoscopy; multiple colon polyps; repeat colonoscopy in one year.   COPD (chronic obstructive pulmonary disease) ([place])    Coronary artery disease 11/2009   Anterior MI with late presentation 11/2009. Cath showed a 100% proximal LAD  occlusion, 99% mid RCA. Had a PCI and 2 DES to both LAD and RCA. EF was 35%. Nuclear stress test 05/13: prior anterior/inferior infarcts without ischemic, EF 43%, cardiac cath in 02/14: Widely patent stents with no other obstructive disease. EF 40%    Depression    Diabetes mellitus without complication (HCC)    GERD (gastroesophageal reflux disease)    [id number](253.6)    Helicobacter pylori (H. pylori)    Hemorrhoids    Hernia    inguinal   Hyperlipidemia    Hypertension    Insomnia    MI (myocardial infarction)    Myalgia    Peptic ulcer    Shortness of breath dyspnea    Status post dilation of esophageal narrowing 2000   Thyroid dysfunction    Tinea pedis    Wears dentures    partial upper    Tobacco Use: History  Smoking Status   Current Every Day Smoker   Packs/day: 0.50   Years: 42.00   Types: Cigarettes  Smokeless Tobacco   Never Used    Comment: smoking 15-18 cigarettes daily    Labs: Recent Review Flowsheet Data    Labs for ITP Cardiac and Pulmonary Rehab Latest Ref Rng & Units 10/20/2015 03/02/2016 06/15/2016 08/24/2016 03/01/2017   Cholestrol 100 - 199 mg/dL 165 140 132 123(L) 112   LDLCALC 0 - 99 mg/dL 90 53 40 30 47   HDL >39 mg/dL 54 73 69 68 33(L)   Trlycerides 0 - 149 mg/dL 103 71 115 126 158(H)   Hemoglobin A1c 4.8 - 5.6 % 6.5(H) 6.1(H) 6.3 5.9(H) 5.3       ADL UCSD:   Pulmonary Function Assessment:   Exercise Target Goals:    Exercise Program Goal: Individual exercise prescription set with THRR, safety & activity barriers. Participant demonstrates ability to understand and report RPE using BORG scale, to self-measure pulse accurately, and to acknowledge the importance of the exercise prescription.  Exercise Prescription Goal: Starting with aerobic activity 30 plus minutes a day, 3 days per week for initial exercise prescription. Provide home exercise prescription and guidelines that participant acknowledges understanding  prior to discharge.  Activity Barriers & Risk Stratification:   6 Minute Walk:  Oxygen Initial Assessment:   Oxygen Re-Evaluation:   Oxygen Discharge (Final Oxygen Re-Evaluation):   Initial Exercise Prescription:   Perform Capillary Blood Glucose checks as needed.  Exercise Prescription Changes:     Exercise Prescription Changes    Row Name 04/13/17 1400 04/27/17 1300 05/10/17 1500         Response to Exercise   Blood Pressure (Admit) 110/58 106/50 110/60     Blood Pressure (Exercise) 120/60 162/76 130/70     Blood Pressure (Exit) 100/60 114/62 104/46     Heart Rate (Admit) 82 bpm 63 bpm 87 bpm  Heart Rate (Exercise) 107 bpm 107 bpm 125 bpm     Heart Rate (Exit) 80 bpm 77 bpm 93 bpm     Oxygen Saturation (Admit) 96 % 94 % 93 %     Oxygen Saturation (Exercise) 92 % 90 % 89 %     Oxygen Saturation (Exit) 95 % 93 % 94 %     Rating of Perceived Exertion (Exercise) _0 Perceived Dyspnea (Exercise) _1 Symptoms none none none     Comments first full day of exercise results second full day  --     Duration Progress to 45 minutes of aerobic exercise without signs/symptoms of physical distress Progress to 45 minutes of aerobic exercise without signs/symptoms of physical distress Continue with 45 min of aerobic exercise without signs/symptoms of physical distress.     Intensity THRR unchanged THRR unchanged THRR unchanged       Progression   Progression Continue to progress workloads to maintain intensity without signs/symptoms of physical distress. Continue to progress workloads to maintain intensity without signs/symptoms of physical distress. Continue to progress workloads to maintain intensity without signs/symptoms of physical distress.     Average METs 2.17 2.67 3.06       Resistance Training   Training Prescription Yes Yes Yes     Weight 5 lbs 5 lbs 5 lbs     Reps 10-15 10-15 10-15       Interval Training   Interval Training No No No        Treadmill   MPH _2 Grade 0 0 0.5     Minutes  -- 15 15     METs 2.53 2.53 2.67       NuStep   Level 3  -- 5     SPM 71  -- 83     Minutes 15  -- 15     METs 2.3  -- 3.7       Recumbant Elliptical   Level _3 RPM 50 60 45     Minutes _4 METs 1.7 2.3 2.7        Exercise Comments:     Exercise Comments    Row Name 04/04/17 1241           Exercise Comments First full day of exercise!  Patient was oriented to gym and equipment including functions, settings, policies, and procedures.  Patient's individual exercise prescription and treatment plan were reviewed.  All starting workloads were established based on the results of the 6 minute walk test done at initial orientation visit.  The plan for exercise progression was also introduced and progression will be customized based on patient's performance and goals.          Exercise Goals and Review:   Exercise Goals Re-Evaluation :     Exercise Goals Re-Evaluation    Row Name 04/13/17 1403 04/27/17 1348 05/10/17 1504         Exercise Goal Re-Evaluation   Exercise Goals Review Increase Physical Activity;Increase Strenth and Stamina Increase Physical Activity;Increase Strenth and Stamina Increase Physical Activity;Increase Strenth and Stamina     Comments Leondro has completed his first full day of exercise.  He has not been back, but we will continue to track his progress. Charlee has only attended one day of exercise since last review. He had improved to level  3 on the Recumbent Elliptical.  We will  continue to monitor his progress. Dom has only attended two days since last review.  He was able to add some incline to the treadmill.  We will continue to work on his progression.     Expected Outcomes Short: Come to classes more consisitently.  Long: Work on Animator. Short: Improve consisitency.  Long: Make exercise part of routine. Short: Come to class regularly.  Long: Make  exercise part of daily activity.        Discharge Exercise Prescription (Final Exercise Prescription Changes):     Exercise Prescription Changes - 05/10/17 1500      Response to Exercise   Blood Pressure (Admit) 110/60   Blood Pressure (Exercise) 130/70   Blood Pressure (Exit) 104/46   Heart Rate (Admit) 87 bpm   Heart Rate (Exercise) 125 bpm   Heart Rate (Exit) 93 bpm   Oxygen Saturation (Admit) 93 %   Oxygen Saturation (Exercise) 89 %   Oxygen Saturation (Exit) 94 %   Rating of Perceived Exertion (Exercise) 11   Perceived Dyspnea (Exercise) 2   Symptoms none   Duration Continue with 45 min of aerobic exercise without signs/symptoms of physical distress.   Intensity THRR unchanged     Progression   Progression Continue to progress workloads to maintain intensity without signs/symptoms of physical distress.   Average METs 3.06     Resistance Training   Training Prescription Yes   Weight 5 lbs   Reps 10-15     Interval Training   Interval Training No     Treadmill   MPH 2   Grade 0.5   Minutes 15   METs 2.67     NuStep   Level 5   SPM 83   Minutes 15   METs 3.7     Recumbant Elliptical   Level 4   RPM 45   Minutes 15   METs 2.7      Nutrition:  Target Goals: Understanding of nutrition guidelines, daily intake of sodium <1547m, cholesterol <2021m calories 30% from fat and 7% or less from saturated fats, daily to have 5 or more servings of fruits and vegetables.  Biometrics:    Nutrition Therapy Plan and Nutrition Goals:   Nutrition Discharge: Rate Your Plate Scores:   Nutrition Goals Re-Evaluation:   Nutrition Goals Discharge (Final Nutrition Goals Re-Evaluation):   Psychosocial: Target Goals: Acknowledge presence or absence of significant depression and/or stress, maximize coping skills, provide positive support system. Participant is able to verbalize types and ability to use techniques and skills needed for reducing stress and  depression.   Initial Review & Psychosocial Screening:   Quality of Life Scores:   PHQ-9: Recent Review Flowsheet Data    [facility] 03/22[facility]2017 01/15/2017 12/15/2016 11/18/2016 10/27/2016   Decreased Interest 2 0 _0 Down, Depressed, Hopeless 2 0 _1 PHQ - 2 Score 4 0 _2 Altered sleeping 2 - 0 2 3   Tired, decreased energy 2 - _3 Change in appetite 2 - 0 2 2   Feeling bad or failure about yourself  2 - 0 3 1   Trouble concentrating 2 - _4 Moving slowly or fidgety/restless 2 - 3 0 1   Suicidal thoughts 0 - 0 0 -   PHQ-9 Score 16 - _5 Difficult doing  work/chores Somewhat difficult - - - -     Interpretation of Total Score  Total Score Depression Severity:  1-4 = Minimal depression, 5-9 = Mild depression, 10-14 = Moderate depression, 15-19 = Moderately severe depression, 20-27 = Severe depression   Psychosocial Evaluation and Intervention:     Psychosocial Evaluation - 05/09/17 1214      Psychosocial Evaluation & Interventions   Interventions Encouraged to exercise with the program and follow exercise prescription;Stress management education;Relaxation education   Comments Counselor met with Mr. Pak [facility]) today for initial psychosocial evaluation.  He is a 66 year old who has Chronic Bronchitis; emphysema; and COPD.  He has a strong support system with a spouse of over 30 years; adult children who live close by and participates in his local church.  Maurio had heart stents inserted 8-9 years ago.  He reports "not sleeping well" but having a good appetite.  He also admits to a history of depression and anxiety and is on medications that help with any current symptoms.  His PHQ-9 scores are 16 indicating moderately severe depression.   However, he reports typically being in a positive mood.  Makana has multiple stressors which can be impacting his depression inventory with his own health and that of his spouse who had a stroke and he  is having to be a caregiver to some degree.  Jahquez also reports it is stressful for him to try to quit smoking; reporting smokes approx. 1 pack per day - down from 2-3 packs several years ago and has smoked for 45-50 years.  Everrett is using patches; Nicorette gum and Nicatrol to help with this.  Counselor encouraged him to try cutting out one cigarette per week as a means of smoking reduction and he agreed to give it a try.  He has goals to "get rid of the cigarettes," and to just feel better and stronger overall.  Counselor and staff will continue to monitor his progress throughout the course of this program.      Expected Outcomes Harshan will benefit from consistent exercise to achieve his stated goals.  He will also benefit from the stress management and relaxation education to help with positive coping strategies for his current stressors.  Staff will follow with Romie to encourage & provide educational information for smoking cessation.     Continue Psychosocial Services  Follow up required by staff      Psychosocial Re-Evaluation:   Psychosocial Discharge (Final Psychosocial Re-Evaluation):   Education: Education Goals: Education classes will be provided on a weekly basis, covering required topics. Participant will state understanding/return demonstration of topics presented.  Learning Barriers/Preferences:   Education Topics: Initial Evaluation Education: - Verbal, written and demonstration of respiratory meds, RPE/PD scales, oximetry and breathing techniques. Instruction on use of nebulizers and MDIs: cleaning and proper use, rinsing mouth with steroid doses and importance of monitoring MDI activations.   Pulmonary Rehab from 03/22/2017 in [facility] Cardiac and Pulmonary Rehab  Date  03/22/17  Educator  LB  Instruction Review Code  2- meets goals/outcomes      General Nutrition Guidelines/Fats and Fiber: -Group instruction provided by verbal, written material, models and posters  to present the general guidelines for heart healthy nutrition. Gives an explanation and review of dietary fats and fiber.   Pulmonary Rehab from 04/18/2017 in [facility] Cardiac and Pulmonary Rehab  Date  04/18/17  Educator  CR  Instruction Review Code  2- meets goals/outcomes      Controlling Sodium/Reading Food  Labels: -Group verbal and written material supporting the discussion of sodium use in heart healthy nutrition. Review and explanation with models, verbal and written materials for utilization of the food label.   Exercise Physiology & Risk Factors: - Group verbal and written instruction with models to review the exercise physiology of the cardiovascular system and associated critical values. Details cardiovascular disease risk factors and the goals associated with each risk factor.   Aerobic Exercise & Resistance Training: - Gives group verbal and written discussion on the health impact of inactivity. On the components of aerobic and resistive training programs and the benefits of this training and how to safely progress through these programs.   Flexibility, Balance, General Exercise Guidelines: - Provides group verbal and written instruction on the benefits of flexibility and balance training programs. Provides general exercise guidelines with specific guidelines to those with heart or lung disease. Demonstration and skill practice provided.   Stress Management: - Provides group verbal and written instruction about the health risks of elevated stress, cause of high stress, and healthy ways to reduce stress.   Depression: - Provides group verbal and written instruction on the correlation between heart/lung disease and depressed mood, treatment options, and the stigmas associated with seeking treatment.   Exercise & Equipment Safety: - Individual verbal instruction and demonstration of equipment use and safety with use of the equipment.   Pulmonary Rehab from 04/18/2017 in [facility]  Cardiac and Pulmonary Rehab  Date  04/04/17  Educator  AS  Instruction Review Code  2- meets goals/outcomes      Infection Prevention: - Provides verbal and written material to individual with discussion of infection control including proper hand washing and proper equipment cleaning during exercise session.   Pulmonary Rehab from 04/18/2017 in [facility] Cardiac and Pulmonary Rehab  Date  04/04/17  Educator  AS  Instruction Review Code  2- meets goals/outcomes      Falls Prevention: - Provides verbal and written material to individual with discussion of falls prevention and safety.   Diabetes: - Individual verbal and written instruction to review signs/symptoms of diabetes, desired ranges of glucose level fasting, after meals and with exercise. Advice that pre and post exercise glucose checks will be done for 3 sessions at entry of program.   Chronic Lung Diseases: - Group verbal and written instruction to review new updates, new respiratory medications, new advancements in procedures and treatments. Provide informative websites and "800" numbers of self-education.   Lung Procedures: - Group verbal and written instruction to describe testing methods done to diagnose lung disease. Review the outcome of test results. Describe the treatment choices: Pulmonary Function Tests, ABGs and oximetry.   Energy Conservation: - Provide group verbal and written instruction for methods to conserve energy, plan and organize activities. Instruct on pacing techniques, use of adaptive equipment and posture/positioning to relieve shortness of breath.   Triggers: - Group verbal and written instruction to review types of environmental controls: home humidity, furnaces, filters, dust mite/pet prevention, HEPA vacuums. To discuss weather changes, air quality and the benefits of nasal washing.   Exacerbations: - Group verbal and written instruction to provide: warning signs, infection symptoms, calling MD  promptly, preventive modes, and value of vaccinations. Review: effective airway clearance, coughing and/or vibration techniques. Create an Sports administrator.   Oxygen: - Individual and group verbal and written instruction on oxygen therapy. Includes supplement oxygen, available portable oxygen systems, continuous and intermittent flow rates, oxygen safety, concentrators, and Medicare reimbursement for oxygen.   Pulmonary  Rehab from 03/22/2017 in [facility] Cardiac and Pulmonary Rehab  Date  03/22/17  Educator  LB  Instruction Review Code  2- meets goals/outcomes      Respiratory Medications: - Group verbal and written instruction to review medications for lung disease. Drug class, frequency, complications, importance of spacers, rinsing mouth after steroid MDI's, and proper cleaning methods for nebulizers.   Pulmonary Rehab from 03/22/2017 in [facility] Cardiac and Pulmonary Rehab  Date  03/22/17  Educator  LB  Instruction Review Code  2- meets goals/outcomes      AED/CPR: - Group verbal and written instruction with the use of models to demonstrate the basic use of the AED with the basic ABC's of resuscitation.   Breathing Retraining: - Provides individuals verbal and written instruction on purpose, frequency, and proper technique of diaphragmatic breathing and pursed-lipped breathing. Applies individual practice skills.   Pulmonary Rehab from 03/22/2017 in [facility] Cardiac and Pulmonary Rehab  Date  03/22/17  Educator  LB  Instruction Review Code  2- meets goals/outcomes      Anatomy and Physiology of the Lungs: - Group verbal and written instruction with the use of models to provide basic lung anatomy and physiology related to function, structure and complications of lung disease.   Heart Failure: - Group verbal and written instruction on the basics of heart failure: signs/symptoms, treatments, explanation of ejection fraction, enlarged heart and cardiomyopathy.   Sleep Apnea: - Individual verbal  and written instruction to review Obstructive Sleep Apnea. Review of risk factors, methods for diagnosing and types of masks and machines for OSA.   Anxiety: - Provides group, verbal and written instruction on the correlation between heart/lung disease and anxiety, treatment options, and management of anxiety.   Relaxation: - Provides group, verbal and written instruction about the benefits of relaxation for patients with heart/lung disease. Also provides patients with examples of relaxation techniques.   Knowledge Questionnaire Score:    Core Components/Risk Factors/Patient Goals at Admission:   Core Components/Risk Factors/Patient Goals Review:      Goals and Risk Factor Review    Row Name 05/18/17 1510             Core Components/Risk Factors/Patient Goals Review   Personal Goals Review Weight Management/Obesity;Improve shortness of breath with ADL's;Increase knowledge of respiratory medications and ability to use respiratory devices properly.;Develop more efficient breathing techniques such as purse lipped breathing and diaphragmatic breathing and practicing self-pacing with activity.;Tobacco Cessation;Hypertension;Lipids       Review Mr Stoudt has completed 4 sessions of [facility], but has not attended regularly. He still works and does take care of his wife who had a stroke. Mr Milligan has made some increased with his exercise goals and does pace himself and uses PLB with his goals and home activities. He was a 1 and 1/2ppd, has decreased to 1dd, but he has difficulty smoking less than the pack. Mr Kapur is using nicotine gum. He has a good understanding of his MDI's and other medication and is compliant. He would like to gain 10-15lbs, and hopefully we can set him up with the dietitian.       Expected Outcomes Continue exercise with regualr attendance and continue working on smoking cessation.          Core Components/Risk Factors/Patient Goals at Discharge (Final Review):       Goals and Risk Factor Review - 05/18/17 1510      Core Components/Risk Factors/Patient Goals Review   Personal Goals Review Weight Management/Obesity;Improve shortness of  breath with ADL's;Increase knowledge of respiratory medications and ability to use respiratory devices properly.;Develop more efficient breathing techniques such as purse lipped breathing and diaphragmatic breathing and practicing self-pacing with activity.;Tobacco Cessation;Hypertension;Lipids   Review Mr Granier has completed 4 sessions of [facility], but has not attended regularly. He still works and does take care of his wife who had a stroke. Mr Ambrosini has made some increased with his exercise goals and does pace himself and uses PLB with his goals and home activities. He was a 1 and 1/2ppd, has decreased to 1dd, but he has difficulty smoking less than the pack. Mr Dimaio is using nicotine gum. He has a good understanding of his MDI's and other medication and is compliant. He would like to gain 10-15lbs, and hopefully we can set him up with the dietitian.   Expected Outcomes Continue exercise with regualr attendance and continue working on smoking cessation.      ITP Comments:     ITP Comments    Row Name 04/13/17 1407 04/25/17 0905 05/23/17 0837       ITP Comments Called to check on status of return, out since first visit last week.  Left message on VM. 30 day note review with Dr Emily Filbert, Medical Director of [facility] 30 day note review by Dr Emily Filbert, Medical Director of [facility]        Comments: 30 day note review by Dr Emily Filbert, Medical Director of [facility] [facility]

## 2017-05-25 ENCOUNTER — Encounter: Payer: Self-pay | Admitting: *Deleted

## 2017-05-25 ENCOUNTER — Telehealth: Payer: Self-pay | Admitting: *Deleted

## 2017-05-25 DIAGNOSIS — J449 Chronic obstructive pulmonary disease, unspecified: Secondary | ICD-10-CM

## 2017-05-25 NOTE — Telephone Encounter (Signed)
Called to check on status of patient.  Last attended on 05/09/17.  Left message on voicemail.

## 2017-06-08 ENCOUNTER — Encounter: Payer: Self-pay | Admitting: *Deleted

## 2017-06-08 ENCOUNTER — Telehealth: Payer: Self-pay | Admitting: *Deleted

## 2017-06-08 DIAGNOSIS — J449 Chronic obstructive pulmonary disease, unspecified: Secondary | ICD-10-CM

## 2017-06-08 NOTE — Telephone Encounter (Signed)
Called to check on status of return.  He stated he has not been feeling up to coming.  He stated that he will be back on Monday.

## 2017-06-17 ENCOUNTER — Ambulatory Visit (INDEPENDENT_AMBULATORY_CARE_PROVIDER_SITE_OTHER): Payer: BLUE CROSS/BLUE SHIELD | Admitting: Family Medicine

## 2017-06-17 ENCOUNTER — Encounter: Payer: Self-pay | Admitting: Family Medicine

## 2017-06-17 VITALS — BP 108/63 | HR 92 | Temp 98.6°F | Resp 16 | Ht 68.5 in | Wt 150.2 lb

## 2017-06-17 DIAGNOSIS — I10 Essential (primary) hypertension: Secondary | ICD-10-CM | POA: Diagnosis not present

## 2017-06-17 DIAGNOSIS — F341 Dysthymic disorder: Secondary | ICD-10-CM | POA: Diagnosis not present

## 2017-06-17 DIAGNOSIS — I25118 Atherosclerotic heart disease of native coronary artery with other forms of angina pectoris: Secondary | ICD-10-CM

## 2017-06-17 DIAGNOSIS — F17200 Nicotine dependence, unspecified, uncomplicated: Secondary | ICD-10-CM

## 2017-06-17 DIAGNOSIS — R5383 Other fatigue: Secondary | ICD-10-CM

## 2017-06-17 DIAGNOSIS — E7439 Other disorders of intestinal carbohydrate absorption: Secondary | ICD-10-CM | POA: Diagnosis not present

## 2017-06-17 DIAGNOSIS — J449 Chronic obstructive pulmonary disease, unspecified: Secondary | ICD-10-CM | POA: Diagnosis not present

## 2017-06-17 DIAGNOSIS — E7800 Pure hypercholesterolemia, unspecified: Secondary | ICD-10-CM

## 2017-06-17 LAB — POCT URINALYSIS DIP (MANUAL ENTRY)
Bilirubin, UA: NEGATIVE
Glucose, UA: NEGATIVE mg/dL
Ketones, POC UA: NEGATIVE mg/dL
Leukocytes, UA: NEGATIVE
NITRITE UA: NEGATIVE
RBC UA: NEGATIVE
SPEC GRAV UA: 1.015 (ref 1.010–1.025)
UROBILINOGEN UA: 0.2 U/dL
pH, UA: 8 (ref 5.0–8.0)

## 2017-06-17 MED ORDER — FLUOXETINE HCL 20 MG PO TABS: 20.0000 mg | ORAL_TABLET | Freq: Every day | ORAL | 3 refills | Status: DC

## 2017-06-17 MED ORDER — PREDNISONE 20 MG PO TABS: ORAL_TABLET | ORAL | 0 refills | Status: DC

## 2017-06-17 NOTE — Progress Notes (Signed)
Subjective:    Patient ID: Leslie Sims, male    DOB: September 24, 1951, 65 y.o.   MRN: [id number]  06/17/2017  Fatigue (Pt been sleeping a lot) and Tingling (in lower extermities )   HPI This 66 y.o. male presents for evaluation of fatigue, tingling in lower extremities.  Three month follow-up.  Tired and does not want to do anything.  Sleeps all day long; then runs an errand and sleep all night.  Makes self to get up and go.  Tired and goes to sleep.  Falls asleep.  Does not take nerve pill every day.  Emotionally better.  No previous Lexapro.   Previous Cymbalta, Wellbutrin, Effexor XR, Prozac.    Feet and bottom of legs are burning and tingling.  Light.  For several months.  Mood is good; worries about health all the time.  Worrying about the same.  Smoking 1 ppd. BP Readings from Last 3 Encounters:  06/17/17 108/63  03/07/17 110/66  03/01/17 128/67   Wt Readings from Last 3 Encounters:  06/17/17 150 lb 3.2 oz (68.1 kg)  03/22/17 149 lb 14.4 oz (68 kg)  03/07/17 146 lb (66.2 kg)   Immunization History  Administered Date(s) Administered  . Influenza Whole 09/19/2012  . Influenza,inj,Quad PF,36+ Mos 10/24/2013, 11/25/2014, 03/02/2016, 10/27/2016  . Pneumococcal Conjugate-13 11/25/2014  . Pneumococcal Polysaccharide-23 08/24/2016, 10/27/2016  . Pneumococcal-Unspecified 12/20/2008  . Tdap 12/20/2008    Review of Systems  Constitutional: Positive for fatigue. Negative for activity change, appetite change, chills, diaphoresis and fever.  Respiratory: Negative for cough and shortness of breath.   Cardiovascular: Negative for chest pain, palpitations and leg swelling.  Gastrointestinal: Negative for abdominal pain, diarrhea, nausea and vomiting.  Endocrine: Negative for cold intolerance, heat intolerance, polydipsia, polyphagia and polyuria.  Skin: Negative for color change, rash and wound.  Neurological: Negative for dizziness, tremors, seizures, syncope, facial asymmetry,  speech difficulty, weakness, light-headedness, numbness and headaches.  Psychiatric/Behavioral: Negative for agitation, dysphoric mood and sleep disturbance. The patient is not nervous/anxious.     Past Medical History:  Diagnosis Date  . Allergic rhinitis   . Anxiety   . Atherosclerosis   . Back pain   . Bronchitis    09-14-11  . CARDIOMYOPATHY 03/24/2010   did not tolerate Toprol and Lisinopril.   . Chest pain   . Chronic airway obstruction, not elsewhere classified   . Colon polyps 08/2012   colonoscopy; multiple colon polyps; repeat colonoscopy in one year.  [name] COPD (chronic obstructive pulmonary disease) ([place])   . Coronary artery disease 11/2009   Anterior MI with late presentation 11/2009. Cath showed a 100% proximal LAD occlusion, 99% mid RCA. Had a PCI and 2 DES to both LAD and RCA. EF was 35%. Nuclear stress test 05/13: prior anterior/inferior infarcts without ischemic, EF 43%, cardiac cath in 02/14: Widely patent stents with no other obstructive disease. EF 40%   . Depression   . Diabetes mellitus without complication ([place])   . GERD (gastroesophageal reflux disease)   . Headache(784.0)   . Helicobacter pylori (H. pylori)   . Hemorrhoids   . Hernia    inguinal  . Hyperlipidemia   . Hypertension   . Insomnia   . MI (myocardial infarction) ([place])   . Myalgia   . Peptic ulcer   . Shortness of breath dyspnea   . Status post dilation of esophageal narrowing 2000  . Thyroid dysfunction   . Tinea pedis   . Wears dentures  partial upper   Past Surgical History:  Procedure Laterality Date  . Admission  12/20/2012   COPD exacerbation.  [place].  [name] CARDIAC CATHETERIZATION    . CARDIAC CATHETERIZATION  02/05/13   [facility]  . CARDIAC CATHETERIZATION  10/14   [facility] : patent stents with no change in anatomy. EF: 40$  . CARDIAC CATHETERIZATION Left 11/12/2016   Procedure: Left Heart Cath and Coronary Angiography;  Surgeon: Wellington Hampshire, MD;  Location: [facility];  Service:  Cardiovascular;  Laterality: Left;  . COLONOSCOPY    . COLONOSCOPY WITH PROPOFOL N/A 05/18/2016   Procedure: COLONOSCOPY WITH PROPOFOL;  Surgeon: Lucilla Lame, MD;  Location: [facility]OSCOPY;  Service: Endoscopy;  Laterality: N/A;  . CORONARY ANGIOPLASTY WITH STENT PLACEMENT  09/2009   LAD 3.0 X23 mm Xience DES, RCA: 4.0 X 15 mm Xience DES  . ESOPHAGEAL DILATION    . ESOPHAGOGASTRODUODENOSCOPY  2008  . ESOPHAGOGASTRODUODENOSCOPY  08/20/2012  . HERNIA REPAIR  07/20/2012   L inguinal hernia repair  . PENILE PROSTHESIS IMPLANT     Allergies  Allergen Reactions  . Effexor Xr [Venlafaxine Hcl Er]   . Wellbutrin [Bupropion] Other (See Comments)    Unknown/"made me feel real funny"    Social History   Social History  . Marital status: Married    Spouse name: N/A  . Number of children: 5  . Years of education: N/A   Occupational History  . 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 Smoking status: Current Every Day Smoker    Packs/day: 0.50    Years: 42.00    Types: Cigarettes  . Smokeless tobacco: Never Used     Comment: smoking 15-18 cigarettes daily  . Alcohol use No  . Drug use: No  . Sexual activity: Yes   Other Topics Concern  . Not on file   Social History Narrative   Marital status: married x 32 years; happily married.      Children: 4 children;  1 stepchild and 15 grandchildren; no great grandchildren.      Lives: with wife; 2 dogs, 1 cat.      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      Tobacco: 1 ppd x 45 years      Alcohol: none; previous alcoholism      Drugs: none      Exercise: no formal exercise; physically demanding job; owns horses.      Seatbelts:  100%      Guns: one loaded unsecured gun in the home.      Sunscreen: none   Family History  Problem Relation Age of Onset  . Heart attack Brother        Brother #1  . Diabetes Brother        brother #2  . Hypertension Brother        #3  . Coronary  artery disease Father 47       deceased  . Heart attack Father   . Diabetes Father   . Heart disease Father   . COPD Mother 56       deceased  . Heart attack Sister 69       acute-cocaine abuse/deceased  . Alcohol abuse Sister        polysubstance abuse  . COPD Sister   . Alcohol abuse Sister        polysubstance abuse  . Heart disease Brother  AMI x multiple       Objective:    BP 108/63   Pulse 92   Temp 98.6 F (37 C) (Oral)   Resp 16   Ht 5' 8.5" (1.74 m)   Wt 150 lb 3.2 oz (68.1 kg)   SpO2 98%   BMI 22.51 kg/m  Physical Exam  Constitutional: He is oriented to person, place, and time. He appears well-developed and well-nourished. No distress.  HENT:  Head: Normocephalic and atraumatic.  Right Ear: External ear normal.  Left Ear: External ear normal.  Nose: Nose normal.  Mouth/Throat: Oropharynx is clear and moist.  Eyes: Conjunctivae and EOM are normal. Pupils are equal, round, and reactive to light.  Neck: Normal range of motion. Neck supple. Carotid bruit is not present. No thyromegaly present.  Cardiovascular: Normal rate, regular rhythm, normal heart sounds and intact distal pulses.  Exam reveals no gallop and no friction rub.   No murmur heard. Pulmonary/Chest: Effort normal and breath sounds normal. He has no wheezes. He has no rales.  Abdominal: Soft. Bowel sounds are normal. He exhibits no distension and no mass. There is no tenderness. There is no rebound and no guarding.  Lymphadenopathy:    He has no cervical adenopathy.  Neurological: He is alert and oriented to person, place, and time. No cranial nerve deficit.  Skin: Skin is warm and dry. No rash noted. He is not diaphoretic.  Psychiatric: He has a normal mood and affect. His behavior is normal. Judgment and thought content normal.  Nursing note and vitals reviewed.   Depression screen [facility] 2/9 06/17/2017 03/22/2017 01/15/2017 12/15/2016 11/18/2016  Decreased Interest 0 2 0 1 3  Down,  Depressed, Hopeless 0 2 0 1 3  PHQ - 2 Score 0 4 0 2 6  Altered sleeping - 2 - 0 2  Tired, decreased energy - 2 - 3 2  Change in appetite - 2 - 0 2  Feeling bad or failure about yourself  - 2 - 0 3  Trouble concentrating - 2 - 3 3  Moving slowly or fidgety/restless - 2 - 3 0  Suicidal thoughts - 0 - 0 0  PHQ-9 Score - 16 - 11 18  Difficult doing work/chores - Somewhat difficult - - -  Some recent data might be hidden       Assessment & Plan:   1. Other fatigue   2. Essential hypertension   3. Atherosclerosis of native coronary artery of native heart with other form of angina pectoris ([place])   4. COPD, very severe ([place])   5. DEPRESSION/ANXIETY   6. Pure hypercholesterolemia   7. SMOKER   8. Glucose intolerance    -presenting with fatigue yet is doing well emotionally on Paxil; will switch Paxil to PRozac as Paxil can cause hypersomnolence and fatigue. Recommend stopping Benzo as well. -obtain labs to rule out secondary causes of fatigue. -encourage smoking cessation. -pt requesting refill of Prednisone to have on hand for COPD exacerbation; rx provided yet discussed side effects and risk of repeat prednisone exposure.   Orders Placed This Encounter  Procedures  . CBC with Differential/Platelet  . Comprehensive metabolic panel  . Hemoglobin A1c  . TSH  . VITAMIN D 25 Hydroxy (Vit-D Deficiency, Fractures)  . Vitamin B12  . POCT urinalysis dipstick   Meds ordered this encounter  Medications  . FLUoxetine (PROZAC) 20 MG tablet    Sig: Take 1 tablet (20 mg total) by mouth daily.    Dispense:  30 tablet    Refill:  3  . predniSONE (DELTASONE) 20 MG tablet    Sig: Two tablets daily x 5 days then one tablet daily x 5 days    Dispense:  15 tablet    Refill:  0    Return in about 4 weeks (around 07/15/2017) for complete physical examiniation.   Nikesh Teschner Elayne Guerin, M.D. Primary Care at [facility] [address] [place], [place]  [address] [phone] phone [phone] fax

## 2017-06-17 NOTE — Patient Instructions (Addendum)
  DECREASE PAXIL XR 25MG  ONE EVERY OTHER DAY FOR ONE MONTH AND THEN STOP. START PROZAC/FLUOXETINE 20MG  ONE TABLET EVERY MORNING.   IF you received an x-ray today, you will receive an invoice from [facility]. Please contact [facility] at [phone] with questions or concerns regarding your invoice.   IF you received labwork today, you will receive an invoice from [facility]. Please contact [facility] at [phone] with questions or concerns regarding your invoice.   Our billing staff will not be able to assist you with questions regarding bills from these companies.  You will be contacted with the lab results as soon as they are available. The fastest way to get your results is to activate your My Chart account. Instructions are located on the last page of this paperwork. If you have not heard from [place] regarding the results in 2 weeks, please contact this office.

## 2017-06-18 LAB — CBC WITH DIFFERENTIAL/PLATELET
BASOS ABS: 0 10*3/uL (ref 0.0–0.2)
BASOS: 0 %
EOS (ABSOLUTE): 0.8 10*3/uL — AB (ref 0.0–0.4)
Eos: 11 %
HEMOGLOBIN: 13.4 g/dL (ref 13.0–17.7)
Hematocrit: 41 % (ref 37.5–51.0)
IMMATURE GRANS (ABS): 0 10*3/uL (ref 0.0–0.1)
IMMATURE GRANULOCYTES: 0 %
Lymphocytes Absolute: 1.9 10*3/uL (ref 0.7–3.1)
Lymphs: 26 %
MCH: 29.6 pg (ref 26.6–33.0)
MCHC: 32.7 g/dL (ref 31.5–35.7)
MCV: 91 fL (ref 79–97)
MONOCYTES: 11 %
Monocytes Absolute: 0.8 10*3/uL (ref 0.1–0.9)
NEUTROS ABS: 3.7 10*3/uL (ref 1.4–7.0)
NEUTROS PCT: 52 %
PLATELETS: 258 10*3/uL (ref 150–379)
RBC: 4.53 x10E6/uL (ref 4.14–5.80)
RDW: 13.4 % (ref 12.3–15.4)
WBC: 7.2 10*3/uL (ref 3.4–10.8)

## 2017-06-18 LAB — COMPREHENSIVE METABOLIC PANEL
ALBUMIN: 4.4 g/dL (ref 3.6–4.8)
ALT: 22 IU/L (ref 0–44)
AST: 22 IU/L (ref 0–40)
Albumin/Globulin Ratio: 2.6 — ABNORMAL HIGH (ref 1.2–2.2)
Alkaline Phosphatase: 79 IU/L (ref 39–117)
BILIRUBIN TOTAL: 0.2 mg/dL (ref 0.0–1.2)
BUN / CREAT RATIO: 11 (ref 10–24)
BUN: 8 mg/dL (ref 8–27)
CALCIUM: 9.4 mg/dL (ref 8.6–10.2)
CO2: 28 mmol/L (ref 20–29)
Chloride: 98 mmol/L (ref 96–106)
Creatinine, Ser: 0.73 mg/dL — ABNORMAL LOW (ref 0.76–1.27)
GFR calc Af Amer: 114 mL/min/{1.73_m2} (ref >59)
GFR, EST NON AFRICAN AMERICAN: 99 mL/min/{1.73_m2} (ref >59)
GLUCOSE: 98 mg/dL (ref 65–99)
Globulin, Total: 1.7 g/dL (ref 1.5–4.5)
Potassium: 3.8 mmol/L (ref 3.5–5.2)
Sodium: 143 mmol/L (ref 134–144)
TOTAL PROTEIN: 6.1 g/dL (ref 6.0–8.5)

## 2017-06-18 LAB — HEMOGLOBIN A1C
Est. average glucose Bld gHb Est-mCnc: 120 mg/dL
Hgb A1c MFr Bld: 5.8 % — ABNORMAL HIGH (ref 4.8–5.6)

## 2017-06-18 LAB — VITAMIN B12: VITAMIN B 12: 748 pg/mL (ref 232–1245)

## 2017-06-18 LAB — TSH: TSH: 3.33 u[IU]/mL (ref 0.450–4.500)

## 2017-06-18 LAB — VITAMIN D 25 HYDROXY (VIT D DEFICIENCY, FRACTURES): VIT D 25 HYDROXY: 39.8 ng/mL (ref 30.0–100.0)

## 2017-06-19 ENCOUNTER — Encounter: Payer: Self-pay | Admitting: Family Medicine

## 2017-06-20 ENCOUNTER — Encounter: Payer: BLUE CROSS/BLUE SHIELD | Attending: Family Medicine

## 2017-06-20 ENCOUNTER — Encounter: Payer: Self-pay | Admitting: Respiratory Therapy

## 2017-06-20 DIAGNOSIS — J449 Chronic obstructive pulmonary disease, unspecified: Secondary | ICD-10-CM | POA: Insufficient documentation

## 2017-06-20 NOTE — Progress Notes (Signed)
Letter sent.

## 2017-06-20 NOTE — Progress Notes (Signed)
Pulmonary Individual Treatment Plan  Patient Details  Name: Mikaeel Petrow MRN: [id number] Date of Birth: February 23, 1951 Referring Provider:     Pulmonary Rehab from 03/22/2017 in [facility] Cardiac and Pulmonary Rehab  Referring Provider  Tamala Julian      Initial Encounter Date:    Pulmonary Rehab from 03/22/2017 in [facility] Cardiac and Pulmonary Rehab  Date  03/22/17  Referring Provider  Tamala Julian      Visit Diagnosis: Chronic obstructive pulmonary disease, unspecified COPD type ([place])  Patient's Home Medications on Admission:  Current Outpatient Prescriptions:    albuterol (PROVENTIL) (2.5 MG/3ML) 0.083% nebulizer solution, USE 1 VIAL (3 ML) BY NEBULIZATION EVERY 6 HOURS AS NEEDED FOR WHEEZING OR SHORTNESS OF BREATH, Disp: 1080 mL, Rfl: 3   albuterol (VENTOLIN HFA) 108 (90 Base) MCG/ACT inhaler, USE 2 INHALATIONS INTO  THE LUNGS  EVERY 6 HOURS AS NEEDED FOR WHEEZING, Disp: 3 Inhaler, Rfl: 0   aspirin 81 MG tablet, Take 81 mg by mouth daily.  , Disp: , Rfl:    atorvastatin (LIPITOR) 40 MG tablet, Take 1 tablet (40 mg total) by mouth daily., Disp: 90 tablet, Rfl: 3   azelastine (ASTELIN) 0.1 % nasal spray, Place 2 sprays into both nostrils 2 (two) times daily. Use in each nostril as directed, Disp: 30 mL, Rfl: 12   CORTIFOAM 10 % rectal foam, PLACE ONE APPLICATION RECTALLY TWICE DAILY (Patient not taking: Reported on 06/17/2017), Disp: 15 g, Rfl: 5   FLUoxetine (PROZAC) 20 MG tablet, Take 1 tablet (20 mg total) by mouth daily., Disp: 30 tablet, Rfl: 3   fluticasone (FLONASE) 50 MCG/ACT nasal spray, Place 1 spray into both nostrils 2 (two) times daily., Disp: 48 g, Rfl: 3   Fluticasone-Salmeterol (ADVAIR DISKUS) 500-50 MCG/DOSE AEPB, Inhale 1 puff into the lungs 2 (two) times daily., Disp: 180 each, Rfl: 3   furosemide (LASIX) 20 MG tablet, Take 1 tablet (20 mg total) by mouth daily., Disp: 90 tablet, Rfl: 1   LORazepam (ATIVAN) 0.5 MG tablet, Take 1 tablet (0.5 mg total) by mouth 2 (two) times  daily as needed for anxiety., Disp: 60 tablet, Rfl: 0   losartan (COZAAR) 25 MG tablet, Take 1 tablet (25 mg total) by mouth daily., Disp: 90 tablet, Rfl: 3   Multiple Vitamin (MULTIVITAMIN) capsule, Take 1 capsule by mouth daily., Disp: , Rfl:    nicotine (NICODERM CQ - DOSED IN MG/24 HOURS) 21 mg/24hr patch, Place 1 patch (21 mg total) onto the skin daily., Disp: 28 patch, Rfl: 12   nicotine polacrilex (NICORETTE) 4 MG gum, Take 1 each (4 mg total) by mouth as needed for smoking cessation., Disp: 220 tablet, Rfl: 0   NICOTROL 10 MG inhaler, INHALE CONTENTS OF 1 CARTRIDGE (CONTINUOUS PUFFING) INTO THE LUNGS AS NEEDED FOR SMOKING CESSATION (, Disp: 168 each, Rfl: 4   nitroGLYCERIN (NITROSTAT) 0.4 MG SL tablet, Place 1 tablet (0.4 mg total) under the tongue every 5 (five) minutes as needed., Disp: 25 tablet, Rfl: 3   PARoxetine (PAXIL-CR) 25 MG 24 hr tablet, TAKE ONE TABLET BY MOUTH EVERY DAY, Disp: 30 tablet, Rfl: 5   predniSONE (DELTASONE) 20 MG tablet, Two tablets daily x 5 days then one tablet daily x 5 days, Disp: 15 tablet, Rfl: 0   sodium chloride (OCEAN) 0.65 % SOLN nasal spray, Place 2 sprays into both nostrils every 2 (two) hours while awake., Disp: 88 mL, Rfl: 5   tiotropium (SPIRIVA HANDIHALER) 18 MCG inhalation capsule, Place 1 capsule (18 mcg total)  into inhaler and inhale daily., Disp: 90 capsule, Rfl: 3  Past Medical History: Past Medical History:  Diagnosis Date   Allergic rhinitis    Anxiety    Atherosclerosis    Back pain    Bronchitis    09-14-11   CARDIOMYOPATHY 03/24/2010   did not tolerate Toprol and Lisinopril.    Chest pain    Chronic airway obstruction, not elsewhere classified    Colon polyps 08/2012   colonoscopy; multiple colon polyps; repeat colonoscopy in one year.   COPD (chronic obstructive pulmonary disease) ([place])    Coronary artery disease 11/2009   Anterior MI with late presentation 11/2009. Cath showed a 100% proximal LAD occlusion,  99% mid RCA. Had a PCI and 2 DES to both LAD and RCA. EF was 35%. Nuclear stress test 05/13: prior anterior/inferior infarcts without ischemic, EF 43%, cardiac cath in 02/14: Widely patent stents with no other obstructive disease. EF 40%    Depression    Diabetes mellitus without complication (HCC)    GERD (gastroesophageal reflux disease)    DXIPJASN(053.9)    Helicobacter pylori (H. pylori)    Hemorrhoids    Hernia    inguinal   Hyperlipidemia    Hypertension    Insomnia    MI (myocardial infarction) (HCC)    Myalgia    Peptic ulcer    Shortness of breath dyspnea    Status post dilation of esophageal narrowing 2000   Thyroid dysfunction    Tinea pedis    Wears dentures    partial upper    Tobacco Use: History  Smoking Status   Current Every Day Smoker   Packs/day: 0.50   Years: 42.00   Types: Cigarettes  Smokeless Tobacco   Never Used    Comment: smoking 15-18 cigarettes daily    Labs: Recent Review Flowsheet Data    Labs for ITP Cardiac and Pulmonary Rehab Latest Ref Rng & Units 03/02/2016 06/15/2016 08/24/2016 03/01/2017 06/17/2017   Cholestrol 100 - 199 mg/dL 140 132 123(L) 112 -   LDLCALC 0 - 99 mg/dL 53 40 30 47 -   HDL >39 mg/dL 73 69 68 33(L) -   Trlycerides 0 - 149 mg/dL 71 115 126 158(H) -   Hemoglobin A1c 4.8 - 5.6 % 6.1(H) 6.3 5.9(H) 5.3 5.8(H)       ADL UCSD:   Pulmonary Function Assessment:   Exercise Target Goals:    Exercise Program Goal: Individual exercise prescription set with THRR, safety & activity barriers. Participant demonstrates ability to understand and report RPE using BORG scale, to self-measure pulse accurately, and to acknowledge the importance of the exercise prescription.  Exercise Prescription Goal: Starting with aerobic activity 30 plus minutes a day, 3 days per week for initial exercise prescription. Provide home exercise prescription and guidelines that participant acknowledges understanding prior to  discharge.  Activity Barriers & Risk Stratification:   6 Minute Walk:  Oxygen Initial Assessment:   Oxygen Re-Evaluation:   Oxygen Discharge (Final Oxygen Re-Evaluation):   Initial Exercise Prescription:   Perform Capillary Blood Glucose checks as needed.  Exercise Prescription Changes:     Exercise Prescription Changes    Row Name 05/10/17 1500             Response to Exercise   Blood Pressure (Admit) 110/60       Blood Pressure (Exercise) 130/70       Blood Pressure (Exit) 104/46       Heart Rate (Admit) 87 bpm  Heart Rate (Exercise) 125 bpm       Heart Rate (Exit) 93 bpm       Oxygen Saturation (Admit) 93 %       Oxygen Saturation (Exercise) 89 %       Oxygen Saturation (Exit) 94 %       Rating of Perceived Exertion (Exercise) 11       Perceived Dyspnea (Exercise) 2       Symptoms none       Duration Continue with 45 min of aerobic exercise without signs/symptoms of physical distress.       Intensity THRR unchanged         Progression   Progression Continue to progress workloads to maintain intensity without signs/symptoms of physical distress.       Average METs 3.06         Resistance Training   Training Prescription Yes       Weight 5 lbs       Reps 10-15         Interval Training   Interval Training No         Treadmill   MPH 2       Grade 0.5       Minutes 15       METs 2.67         NuStep   Level 5       SPM 83       Minutes 15       METs 3.7         Recumbant Elliptical   Level 4       RPM 45       Minutes 15       METs 2.7          Exercise Comments:   Exercise Goals and Review:   Exercise Goals Re-Evaluation :     Exercise Goals Re-Evaluation    [place] Name 05/10/17 1504 05/25/17 1516 06/08/17 1402         Exercise Goal Re-Evaluation   Exercise Goals Review Increase Physical Activity;Increase Strenth and Stamina  --  --     Comments Kymari has only attended two days since last review.  He was able to add  some incline to the treadmill.  We will continue to work on his progression. Out since last review. Out since last review.     Expected Outcomes Short: Come to class regularly.  Long: Make exercise part of daily activity.  --  --        Discharge Exercise Prescription (Final Exercise Prescription Changes):     Exercise Prescription Changes - 05/10/17 1500      Response to Exercise   Blood Pressure (Admit) 110/60   Blood Pressure (Exercise) 130/70   Blood Pressure (Exit) 104/46   Heart Rate (Admit) 87 bpm   Heart Rate (Exercise) 125 bpm   Heart Rate (Exit) 93 bpm   Oxygen Saturation (Admit) 93 %   Oxygen Saturation (Exercise) 89 %   Oxygen Saturation (Exit) 94 %   Rating of Perceived Exertion (Exercise) 11   Perceived Dyspnea (Exercise) 2   Symptoms none   Duration Continue with 45 min of aerobic exercise without signs/symptoms of physical distress.   Intensity THRR unchanged     Progression   Progression Continue to progress workloads to maintain intensity without signs/symptoms of physical distress.   Average METs 3.06     Resistance Training   Training Prescription Yes  Weight 5 lbs   Reps 10-15     Interval Training   Interval Training No     Treadmill   MPH 2   Grade 0.5   Minutes 15   METs 2.67     NuStep   Level 5   SPM 83   Minutes 15   METs 3.7     Recumbant Elliptical   Level 4   RPM 45   Minutes 15   METs 2.7      Nutrition:  Target Goals: Understanding of nutrition guidelines, daily intake of sodium <1578m, cholesterol <2082m calories 30% from fat and 7% or less from saturated fats, daily to have 5 or more servings of fruits and vegetables.  Biometrics:    Nutrition Therapy Plan and Nutrition Goals:   Nutrition Discharge: Rate Your Plate Scores:   Nutrition Goals Re-Evaluation:   Nutrition Goals Discharge (Final Nutrition Goals Re-Evaluation):   Psychosocial: Target Goals: Acknowledge presence or absence of significant  depression and/or stress, maximize coping skills, provide positive support system. Participant is able to verbalize types and ability to use techniques and skills needed for reducing stress and depression.   Initial Review & Psychosocial Screening:   Quality of Life Scores:   PHQ-9: Recent Review Flowsheet Data    [facility] [address] 06/17/2017 03/22/2017 01/15/2017 12/15/2016 11/18/2016   Decreased Interest 0 2 0 1 3   Down, Depressed, Hopeless 0 2 0 1 3   PHQ - 2 Score 0 4 0 2 6   Altered sleeping - 2 - 0 2   Tired, decreased energy - 2 - 3 2   Change in appetite - 2 - 0 2   Feeling bad or failure about yourself  - 2 - 0 3   Trouble concentrating - 2 - 3 3   Moving slowly or fidgety/restless - 2 - 3 0   Suicidal thoughts - 0 - 0 0   PHQ-9 Score - 16 - 11 18   Difficult doing work/chores - Somewhat difficult - - -     Interpretation of Total Score  Total Score Depression Severity:  1-4 = Minimal depression, 5-9 = Mild depression, 10-14 = Moderate depression, 15-19 = Moderately severe depression, 20-27 = Severe depression   Psychosocial Evaluation and Intervention:     Psychosocial Evaluation - 05/09/17 1214      Psychosocial Evaluation & Interventions   Interventions Encouraged to exercise with the program and follow exercise prescription;Stress management education;Relaxation education   Comments Counselor [facility] for initial psychosocial evaluation.  He is a [age]ear old who has Chronic Bronchitis; emphysema; and COPD.  He has a strong support system with a spouse of over 30 years; adult children who live close by and participates in his local church.  Jojo had heart stents inserted 8-9 years ago.  He reports "not sleeping well" but having a good appetite.  He also admits to a history of depression and anxiety and is on medications that help with any current symptoms.  His PHQ-9 scores are 16 indicating moderately severe depression.   However,  he reports typically being in a positive mood.  JoAgostinoas multiple stressors which can be impacting his depression inventory with his own health and that of his spouse who had a stroke and he is having to be a caregiver to some degree.  Eryn also reports it is stressful for him to try to quit smoking; reporting smokes approx. 1 pack per  day - down from 2-3 packs several years ago and has smoked for 45-50 years.  Marcelle is using patches; Nicorette gum and Nicatrol to help with this.  Counselor encouraged him to try cutting out one cigarette per week as a means of smoking reduction and he agreed to give it a try.  He has goals to "get rid of the cigarettes," and to just feel better and stronger overall.  Counselor and staff will continue to monitor his progress throughout the course of this program.      Expected Outcomes Amahd will benefit from consistent exercise to achieve his stated goals.  He will also benefit from the stress management and relaxation education to help with positive coping strategies for his current stressors.  Staff will follow with Aaric to encourage & provide educational information for smoking cessation.     Continue Psychosocial Services  Follow up required by staff      Psychosocial Re-Evaluation:   Psychosocial Discharge (Final Psychosocial Re-Evaluation):   Education: Education Goals: Education classes will be provided on a weekly basis, covering required topics. Participant will state understanding/return demonstration of topics presented.  Learning Barriers/Preferences:   Education Topics: Initial Evaluation Education: - Verbal, written and demonstration of respiratory meds, RPE/PD scales, oximetry and breathing techniques. Instruction on use of nebulizers and MDIs: cleaning and proper use, rinsing mouth with steroid doses and importance of monitoring MDI activations.   Pulmonary Rehab from 03/22/2017 in [facility] Cardiac and Pulmonary Rehab  Date  03/22/17   Educator  LB  Instruction Review Code  2- meets goals/outcomes      General Nutrition Guidelines/Fats and Fiber: -Group instruction provided by verbal, written material, models and posters to present the general guidelines for heart healthy nutrition. Gives an explanation and review of dietary fats and fiber.   Pulmonary Rehab from 04/18/2017 in [facility] Cardiac and Pulmonary Rehab  Date  04/18/17  Educator  CR  Instruction Review Code  2- meets goals/outcomes      Controlling Sodium/Reading Food Labels: -Group verbal and written material supporting the discussion of sodium use in heart healthy nutrition. Review and explanation with models, verbal and written materials for utilization of the food label.   Exercise Physiology & Risk Factors: - Group verbal and written instruction with models to review the exercise physiology of the cardiovascular system and associated critical values. Details cardiovascular disease risk factors and the goals associated with each risk factor.   Aerobic Exercise & Resistance Training: - Gives group verbal and written discussion on the health impact of inactivity. On the components of aerobic and resistive training programs and the benefits of this training and how to safely progress through these programs.   Flexibility, Balance, General Exercise Guidelines: - Provides group verbal and written instruction on the benefits of flexibility and balance training programs. Provides general exercise guidelines with specific guidelines to those with heart or lung disease. Demonstration and skill practice provided.   Stress Management: - Provides group verbal and written instruction about the health risks of elevated stress, cause of high stress, and healthy ways to reduce stress.   Depression: - Provides group verbal and written instruction on the correlation between heart/lung disease and depressed mood, treatment options, and the stigmas associated with seeking  treatment.   Exercise & Equipment Safety: - Individual verbal instruction and demonstration of equipment use and safety with use of the equipment.   Pulmonary Rehab from 04/18/2017 in [facility] Cardiac and Pulmonary Rehab  Date  04/04/17  Educator  AS  Instruction Review Code  2- meets goals/outcomes      Infection Prevention: - Provides verbal and written material to individual with discussion of infection control including proper hand washing and proper equipment cleaning during exercise session.   Pulmonary Rehab from 04/18/2017 in [facility] Cardiac and Pulmonary Rehab  Date  04/04/17  Educator  AS  Instruction Review Code  2- meets goals/outcomes      Falls Prevention: - Provides verbal and written material to individual with discussion of falls prevention and safety.   Diabetes: - Individual verbal and written instruction to review signs/symptoms of diabetes, desired ranges of glucose level fasting, after meals and with exercise. Advice that pre and post exercise glucose checks will be done for 3 sessions at entry of program.   Chronic Lung Diseases: - Group verbal and written instruction to review new updates, new respiratory medications, new advancements in procedures and treatments. Provide informative websites and "800" numbers of self-education.   Lung Procedures: - Group verbal and written instruction to describe testing methods done to diagnose lung disease. Review the outcome of test results. Describe the treatment choices: Pulmonary Function Tests, ABGs and oximetry.   Energy Conservation: - Provide group verbal and written instruction for methods to conserve energy, plan and organize activities. Instruct on pacing techniques, use of adaptive equipment and posture/positioning to relieve shortness of breath.   Triggers: - Group verbal and written instruction to review types of environmental controls: home humidity, furnaces, filters, dust mite/pet prevention, HEPA vacuums.  To discuss weather changes, air quality and the benefits of nasal washing.   Exacerbations: - Group verbal and written instruction to provide: warning signs, infection symptoms, calling MD promptly, preventive modes, and value of vaccinations. Review: effective airway clearance, coughing and/or vibration techniques. Create an Sports administrator.   Oxygen: - Individual and group verbal and written instruction on oxygen therapy. Includes supplement oxygen, available portable oxygen systems, continuous and intermittent flow rates, oxygen safety, concentrators, and Medicare reimbursement for oxygen.   Pulmonary Rehab from 03/22/2017 in [facility] Cardiac and Pulmonary Rehab  Date  03/22/17  Educator  LB  Instruction Review Code  2- meets goals/outcomes      Respiratory Medications: - Group verbal and written instruction to review medications for lung disease. Drug class, frequency, complications, importance of spacers, rinsing mouth after steroid MDI's, and proper cleaning methods for nebulizers.   Pulmonary Rehab from 03/22/2017 in [facility] Cardiac and Pulmonary Rehab  Date  03/22/17  Educator  LB  Instruction Review Code  2- meets goals/outcomes      AED/CPR: - Group verbal and written instruction with the use of models to demonstrate the basic use of the AED with the basic ABC's of resuscitation.   Breathing Retraining: - Provides individuals verbal and written instruction on purpose, frequency, and proper technique of diaphragmatic breathing and pursed-lipped breathing. Applies individual practice skills.   Pulmonary Rehab from 03/22/2017 in [facility] Cardiac and Pulmonary Rehab  Date  03/22/17  Educator  LB  Instruction Review Code  2- meets goals/outcomes      Anatomy and Physiology of the Lungs: - Group verbal and written instruction with the use of models to provide basic lung anatomy and physiology related to function, structure and complications of lung disease.   Heart Failure: - Group verbal  and written instruction on the basics of heart failure: signs/symptoms, treatments, explanation of ejection fraction, enlarged heart and cardiomyopathy.   Sleep Apnea: - Individual verbal and written instruction to review Obstructive Sleep  Apnea. Review of risk factors, methods for diagnosing and types of masks and machines for OSA.   Anxiety: - Provides group, verbal and written instruction on the correlation between heart/lung disease and anxiety, treatment options, and management of anxiety.   Relaxation: - Provides group, verbal and written instruction about the benefits of relaxation for patients with heart/lung disease. Also provides patients with examples of relaxation techniques.   Knowledge Questionnaire Score:    Core Components/Risk Factors/Patient Goals at Admission:   Core Components/Risk Factors/Patient Goals Review:      Goals and Risk Factor Review    Row Name 05/18/17 1510             Core Components/Risk Factors/Patient Goals Review   Personal Goals Review Weight Management/Obesity;Improve shortness of breath with ADL's;Increase knowledge of respiratory medications and ability to use respiratory devices properly.;Develop more efficient breathing techniques such as purse lipped breathing and diaphragmatic breathing and practicing self-pacing with activity.;Tobacco Cessation;Hypertension;Lipids       Review Mr Stucky has completed 4 sessions of [facility], but has not attended regularly. He still works and does take care of his wife who had a stroke. Mr Cephas has made some increased with his exercise goals and does pace himself and uses PLB with his goals and home activities. He was a 1 and 1/2ppd, has decreased to 1dd, but he has difficulty smoking less than the pack. Mr Codrington is using nicotine gum. He has a good understanding of his MDI's and other medication and is compliant. He would like to gain 10-15lbs, and hopefully we can set him up with the dietitian.        Expected Outcomes Continue exercise with regualr attendance and continue working on smoking cessation.          Core Components/Risk Factors/Patient Goals at Discharge (Final Review):      Goals and Risk Factor Review - 05/18/17 1510      Core Components/Risk Factors/Patient Goals Review   Personal Goals Review Weight Management/Obesity;Improve shortness of breath with ADL's;Increase knowledge of respiratory medications and ability to use respiratory devices properly.;Develop more efficient breathing techniques such as purse lipped breathing and diaphragmatic breathing and practicing self-pacing with activity.;Tobacco Cessation;Hypertension;Lipids   Review Mr Olmeda has completed 4 sessions of [facility], but has not attended regularly. He still works and does take care of his wife who had a stroke. Mr Hass has made some increased with his exercise goals and does pace himself and uses PLB with his goals and home activities. He was a 1 and 1/2ppd, has decreased to 1dd, but he has difficulty smoking less than the pack. Mr Okey is using nicotine gum. He has a good understanding of his MDI's and other medication and is compliant. He would like to gain 10-15lbs, and hopefully we can set him up with the dietitian.   Expected Outcomes Continue exercise with regualr attendance and continue working on smoking cessation.      ITP Comments:     ITP Comments    Row Name 04/25/17 0905 05/23/17 0837 05/25/17 1516 06/08/17 1401 06/20/17 0801   ITP Comments 30 day note review with Dr Emily Filbert, Medical Director of [place] 30 day note review by Dr Emily Filbert, Medical Director of [place] since last review. Left message on voice mail. Called to check on status of return.  He stated he has not been feeling up to coming.  He stated that he will be back on Monday. 30 day note review with Dr Elta Guadeloupe  Sabra Heck, Medical Director of [facility]      Comments: 30 day note review with Dr Emily Filbert,  Medical Director of [place]

## 2017-06-23 ENCOUNTER — Other Ambulatory Visit: Payer: Self-pay | Admitting: Family Medicine

## 2017-06-24 ENCOUNTER — Telehealth: Payer: Self-pay

## 2017-06-24 NOTE — Telephone Encounter (Signed)
Left a message for Leslie Sims to his wife to see if he was ok and if he would be returning to class. His wife said that she hopes so and would have him call back later.

## 2017-06-30 ENCOUNTER — Encounter: Payer: Self-pay | Admitting: Family Medicine

## 2017-06-30 ENCOUNTER — Encounter: Payer: Self-pay | Admitting: *Deleted

## 2017-06-30 ENCOUNTER — Ambulatory Visit
Admission: EM | Admit: 2017-06-30 | Discharge: 2017-06-30 | Discharge status: Against Medical Advice | Payer: BLUE CROSS/BLUE SHIELD | Attending: Family Medicine | Admitting: Family Medicine

## 2017-06-30 DIAGNOSIS — R0602 Shortness of breath: Secondary | ICD-10-CM | POA: Diagnosis not present

## 2017-06-30 DIAGNOSIS — J441 Chronic obstructive pulmonary disease with (acute) exacerbation: Secondary | ICD-10-CM | POA: Diagnosis not present

## 2017-06-30 MED ORDER — METHYLPREDNISOLONE SODIUM SUCC 125 MG IJ SOLR
125.0000 mg | Freq: Once | INTRAMUSCULAR | Status: AC
  Administered 2017-06-30: 125 mg via INTRAMUSCULAR

## 2017-06-30 MED ORDER — PREDNISONE 10 MG (21) PO TBPK: ORAL_TABLET | ORAL | 0 refills | Status: DC

## 2017-06-30 MED ORDER — IPRATROPIUM-ALBUTEROL 0.5-2.5 (3) MG/3ML IN SOLN
3.0000 mL | Freq: Once | RESPIRATORY_TRACT | Status: AC
  Administered 2017-06-30: 3 mL via RESPIRATORY_TRACT

## 2017-06-30 NOTE — ED Provider Notes (Signed)
[facility]    CSN: [id number] Arrival date & time: 06/30/17  1353     History   Chief Complaint Chief Complaint  Patient presents with  . Shortness of Breath    HPI Leslie Sims is a 66 y.o. male.   Patient is a 66 year old white male who comes with exacerbation COPD. He states that he was working on having around his brother who was working a house and a lot of fumes. This happened end of last week when she into the house he did wear mask and started coughing and feeling chest congestion. He has some prednisone at home took 1 or 2 days of it but has not helped. His pulse ox is about 94 and is much lower than that here. He reports shortness of breath not much productive cough but came in because of the [place] progressive shortness of breath. States he doesn't like to use oral prednisone he does have inhaler nebulizer home. Unfortunately patient still smokes. Along with COPD he has colonic polyps pulmonary nodules GERD and atherosclerosis and anxiety. He also has a history of cardiomyopathy as well. Significant COPD cardiac problems in the family mother father Brothers and sisters. He is allergic to Effexor and Wellbutrin.   The history is provided by the patient. No language interpreter was used.  Shortness of Breath  Severity:  Moderate Onset quality:  Sudden Timing:  Constant Progression:  Worsening Chronicity:  New Context: activity, occupational exposure and strong odors   Relieved by:  Nothing Worsened by:  Activity Ineffective treatments:  Inhaler Associated symptoms: sputum production and wheezing     Past Medical History:  Diagnosis Date  . Allergic rhinitis   . Anxiety   . Atherosclerosis   . Back pain   . Bronchitis    09-14-11  . CARDIOMYOPATHY 03/24/2010   did not tolerate Toprol and Lisinopril.   . Chest pain   . Chronic airway obstruction, not elsewhere classified   . Colon polyps 08/2012   colonoscopy; multiple colon polyps; repeat  colonoscopy in one year.  [name] COPD (chronic obstructive pulmonary disease) ([place])   . Coronary artery disease 11/2009   Anterior MI with late presentation 11/2009. Cath showed a 100% proximal LAD occlusion, 99% mid RCA. Had a PCI and 2 DES to both LAD and RCA. EF was 35%. Nuclear stress test 05/13: prior anterior/inferior infarcts without ischemic, EF 43%, cardiac cath in 02/14: Widely patent stents with no other obstructive disease. EF 40%   . Depression   . Diabetes mellitus without complication ([place])   . GERD (gastroesophageal reflux disease)   . Headache(784.0)   . Helicobacter pylori (H. pylori)   . Hemorrhoids   . Hernia    inguinal  . Hyperlipidemia   . Hypertension   . Insomnia   . MI (myocardial infarction) ([place])   . Myalgia   . Peptic ulcer   . Shortness of breath dyspnea   . Status post dilation of esophageal narrowing 2000  . Thyroid dysfunction   . Tinea pedis   . Wears dentures    partial upper    Patient Active Problem List   Diagnosis Date Noted  . Unstable angina ([place])   . Benign neoplasm of ascending colon   . Benign neoplasm of descending colon   . Benign neoplasm of sigmoid colon   . Personal history of colonic polyps   . Pulmonary nodules 01/05/2015  . Bruit of left carotid artery 11/04/2014  . Sleep disorder 07/18/2013  .  Seasonal and perennial allergic rhinitis 05/29/2013  . COPD exacerbation ([place]) 01/02/2013  . Bradycardia 04/20/2011  . SMOKER 07/30/2010  . CAD, NATIVE VESSEL 04/23/2010  . Hyperlipidemia 03/24/2010  . DEPRESSION/ANXIETY 03/24/2010  . Chronic systolic heart failure ([place]) 03/24/2010  . COPD, very severe ([place]) 03/24/2010  . GERD 03/24/2010  . Essential hypertension 11/21/2009    Past Surgical History:  Procedure Laterality Date  . Admission  12/20/2012   COPD exacerbation.  [place].  [name] CARDIAC CATHETERIZATION    . CARDIAC CATHETERIZATION  02/05/13   [facility]  . CARDIAC CATHETERIZATION  10/14   [facility] : patent stents with no change in  anatomy. EF: 40$  . CARDIAC CATHETERIZATION Left 11/12/2016   Procedure: Left Heart Cath and Coronary Angiography;  Surgeon: Wellington Hampshire, MD;  Location: [facility];  Service: Cardiovascular;  Laterality: Left;  . COLONOSCOPY    . COLONOSCOPY WITH PROPOFOL N/A 05/18/2016   Procedure: COLONOSCOPY WITH PROPOFOL;  Surgeon: Lucilla Lame, MD;  Location: [facility];  Service: Endoscopy;  Laterality: N/A;  . CORONARY ANGIOPLASTY WITH STENT PLACEMENT  09/2009   LAD 3.0 X23 mm Xience DES, RCA: 4.0 X 15 mm Xience DES  . ESOPHAGEAL DILATION    . ESOPHAGOGASTRODUODENOSCOPY  2008  . ESOPHAGOGASTRODUODENOSCOPY  08/20/2012  . HERNIA REPAIR  07/20/2012   L inguinal hernia repair  . PENILE PROSTHESIS IMPLANT         Home Medications    Prior to Admission medications   Medication Sig Start Date End Date Taking? Authorizing Provider  albuterol (PROVENTIL) (2.5 MG/3ML) 0.083% nebulizer solution USE 1 VIAL (3 ML) BY NEBULIZATION EVERY 6 HOURS AS NEEDED FOR WHEEZING OR SHORTNESS OF BREATH 05/23/17  Yes [name], MD  aspirin 81 MG tablet Take 81 mg by mouth daily.     Yes [provider]  atorvastatin (LIPITOR) 40 MG tablet Take 1 tablet (40 mg total) by mouth daily. 01/15/17  Yes [name], MD  azelastine (ASTELIN) 0.1 % nasal spray Place 2 sprays into both nostrils 2 (two) times daily. Use in each nostril as directed 03/01/17  Yes [name], MD  CORTIFOAM 10 % rectal foam PLACE ONE APPLICATION RECTALLY TWICE DAILY 10/17/15  Yes [name], MD  fluticasone [facility]) 50 MCG/ACT nasal spray Place 1 spray into both nostrils 2 (two) times daily. 03/01/17  Yes [name], MD  Fluticasone-Salmeterol (ADVAIR DISKUS) 500-50 MCG/DOSE AEPB Inhale 1 puff into the lungs 2 (two) times daily. 07/27/16  Yes [name], MD  furosemide (LASIX) 20 MG tablet Take 1 tablet (20 mg total) by mouth daily. 12/28/16 06/30/17 Yes Wellington Hampshire, MD  losartan (COZAAR) 25 MG tablet Take  1 tablet (25 mg total) by mouth daily. 02/17/17  Yes Minna Merritts, MD  Multiple Vitamin (MULTIVITAMIN) capsule Take 1 capsule by mouth daily.   Yes [provider]  nicotine (NICODERM CQ - DOSED IN MG/24 HOURS) 21 mg/24hr patch Place 1 patch (21 mg total) onto the skin daily. 03/01/17  Yes [name], MD  nicotine polacrilex (NICORETTE) 4 MG gum Take 1 each (4 mg total) by mouth as needed for smoking cessation. 03/01/17  Yes [name], MD  NICOTROL 10 MG inhaler INHALE CONTENTS OF 1 CARTRIDGE (CONTINUOUS PUFFING) INTO THE LUNGS AS NEEDED FOR SMOKING CESSATION ( 07/23/16  Yes [name], MD  PARoxetine (PAXIL-CR) 25 MG 24 hr tablet TAKE ONE TABLET BY MOUTH EVERY DAY 04/06/17  Yes  [name], MD  sodium chloride (OCEAN) 0.65 % SOLN nasal spray Place 2 sprays into both nostrils every 2 (two) hours while awake. 11/18/16 07/27/17 Yes [name], MD  tiotropium (SPIRIVA HANDIHALER) 18 MCG inhalation capsule Place 1 capsule (18 mcg total) into inhaler and inhale daily. 07/27/16  Yes [name], MD  VENTOLIN HFA 108 [phone] Base) MCG/ACT inhaler USE 2 INHALATIONS EVERY 6 HOURS AS NEEDED FOR WHEEZING 06/25/17  Yes [name], MD  FLUoxetine (PROZAC) 20 MG tablet Take 1 tablet (20 mg total) by mouth daily. 06/17/17   [name], MD  LORazepam (ATIVAN) 0.5 MG tablet Take 1 tablet (0.5 mg total) by mouth 2 (two) times daily as needed for anxiety. 01/27/17   [name], MD  nitroGLYCERIN (NITROSTAT) 0.4 MG SL tablet Place 1 tablet (0.4 mg total) under the tongue every 5 (five) minutes as needed. 03/13/15   Minna Merritts, MD  predniSONE (DELTASONE) 20 MG tablet Two tablets daily x 5 days then one tablet daily x 5 days 06/17/17   [name], MD  predniSONE (STERAPRED UNI-PAK 21 TAB) 10 MG (21) TBPK tablet Sig 6 tablet day 1, 5 tablets day 2, 4 tablets day 3,,3tablets day 4, 2 tablets day 5, 1 tablet day 6 take all tablets orally 06/30/17   Frederich Cha, MD    Family  History Family History  Problem Relation Age of Onset  . Heart attack Brother        Brother #1  . Diabetes Brother        brother #2  . Hypertension Brother        #3  . Coronary artery disease Father 73       deceased  . Heart attack Father   . Diabetes Father   . Heart disease Father   . COPD Mother 49       deceased  . Heart attack Sister 77       acute-cocaine abuse/deceased  . Alcohol abuse Sister        polysubstance abuse  . COPD Sister   . Alcohol abuse Sister        polysubstance abuse  . Heart disease Brother        AMI x multiple    Social History Social History  Substance Use Topics  . Smoking status: Current Every Day Smoker    Packs/day: 0.50    Years: 42.00    Types: Cigarettes  . Smokeless tobacco: Never Used     Comment: smoking 15-18 cigarettes daily  . Alcohol use No     Allergies   Effexor xr [venlafaxine hcl er] and Wellbutrin [bupropion]   Review of Systems Review of Systems  Respiratory: Positive for sputum production, chest tightness, shortness of breath and wheezing.   All other systems reviewed and are negative.    Physical Exam Triage Vital Signs ED Triage Vitals  Enc Vitals Group     BP 06/30/17 1357 128/66     Pulse Rate 06/30/17 1357 95     Resp 06/30/17 1357 18     Temp 06/30/17 1357 97.6 F (36.4 C)     Temp Source 06/30/17 1357 Oral     SpO2 06/30/17 1357 (!) 87 %     Weight --      Height --      Head Circumference --      Peak Flow --      Pain Score 06/30/17 1359 0  Pain Loc --      Pain Edu? --      Excl. in [place]? --    No data found.   Updated Vital Signs BP 128/66 (BP Location: Left Arm)   Pulse 95   Temp 97.6 F (36.4 C) (Oral)   Resp 18   SpO2 (!) 84%   Visual Acuity Right Eye Distance:   Left Eye Distance:   Bilateral Distance:    Right Eye Near:   Left Eye Near:    Bilateral Near:     Physical Exam  Constitutional: He is oriented to person, place, and time. He appears  well-developed and well-nourished. No distress.  HENT:  Head: Normocephalic and atraumatic.  Right Ear: External ear normal.  Left Ear: External ear normal.  Mouth/Throat: Oropharynx is clear and moist.  Eyes: Pupils are equal, round, and reactive to light. EOM are normal.  Neck: Normal range of motion. Neck supple.  Cardiovascular: Normal rate.   Pulmonary/Chest: He is in respiratory distress. He has decreased breath sounds. He has wheezes.  Musculoskeletal: Normal range of motion.  Neurological: He is alert and oriented to person, place, and time.  Skin: Skin is warm. He is not diaphoretic.  Psychiatric: He has a normal mood and affect.  Vitals reviewed.    UC Treatments / Results  Labs (all labs ordered are listed, but only abnormal results are displayed) Labs Reviewed - No data to display  EKG  EKG Interpretation None       Radiology No results found.  Procedures Procedures (including critical care time)  Medications Ordered in UC Medications  ipratropium-albuterol (DUONEB) 0.5-2.5 (3) MG/3ML nebulizer solution 3 mL (3 mLs Nebulization Given 06/30/17 1412)  methylPREDNISolone sodium succinate (SOLU-MEDROL) 125 mg/2 mL injection 125 mg (125 mg Intramuscular Given 06/30/17 1451)     Initial Impression / Assessment and Plan / UC Course  I have reviewed the triage vital signs and the nursing notes.  Pertinent labs & imaging results that were available during my care of the patient were reviewed by me and considered in my medical decision making (see chart for details).     Patient was given a DuoNeb treatment still having shortness of breath and wheezing we given 125 mg Solu-Medrol and given prescription for prednisone. I did recommend going to the ED of his choice. His wife said [facility] and he is going to do now and I recommend that he check and that ED or we can send him by rescue squad [facility] he has declined both options wants to go home at this time. I explained to him  this is a urgent care we've really done everything I think that we can do by giving him a breathing treatment and Solu-Medrol IM and glaucoma or prednisone. He admits what he's coughing up is not much so does not require antibiotic at this time asked the nurses get him to sign AMA form since she's insistent on not going to the hospital at this time warned him strongly if he gets worse he really should go to the hospital  Final Clinical Impressions(s) / UC Diagnoses   Final diagnoses:  COPD exacerbation ([place])  SOB (shortness of breath)    New Prescriptions Discharge Medication List as of 06/30/2017  2:59 PM    START taking these medications   Details  predniSONE (STERAPRED UNI-PAK 21 TAB) 10 MG (21) TBPK tablet Sig 6 tablet day 1, 5 tablets day 2, 4 tablets day 3,,3tablets day 4, 2 tablets day  5, 1 tablet day 6 take all tablets orally, Normal           Note: This dictation was prepared with Dragon dictation along with smaller phrase technology. Any transcriptional errors that result from this process are unintentional.   Frederich Cha, MD 06/30/17 [phone]

## 2017-06-30 NOTE — ED Triage Notes (Signed)
Patient started having symptom of SOB 6 days ago. Patient has COPD.

## 2017-07-02 ENCOUNTER — Telehealth: Payer: Self-pay | Admitting: Family Medicine

## 2017-07-02 MED ORDER — DOXYCYCLINE HYCLATE 100 MG PO CAPS: 100.0000 mg | ORAL_CAPSULE | Freq: Two times a day (BID) | ORAL | 0 refills | Status: DC

## 2017-07-02 NOTE — Telephone Encounter (Signed)
Adv pt we did recv'e his email for a medication to be called in.

## 2017-07-02 NOTE — Telephone Encounter (Signed)
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  Presented to Urgent Care two days ago and treated for COPD exacerbation with Solumedrol 125mg  and duoneb.  Pulse oximetry malfunctioning at Urgent Care and reading 88%; pt had checked at home by own pulse oximeter and reading 94-100% on room air.  Requesting antibiotic.  Breathing improved since Solumedrol.  A/P COPD exacerbation: continue Prednisone. Add Doxycyline.

## 2017-07-05 DIAGNOSIS — J449 Chronic obstructive pulmonary disease, unspecified: Secondary | ICD-10-CM

## 2017-07-05 NOTE — Progress Notes (Signed)
Daily Session Note  Patient Details  Name: Leslie Sims MRN: [id number] Date of Birth: 09/05/1951 Referring Provider:     Pulmonary Rehab from 03/22/2017 in [facility]  Referring Provider  Smith      Encounter Date: 03/22/2017  Check In:      History  Smoking Status  . Current Every Day Smoker  . Packs/day: 0.50  . Years: 42.00  . Types: Cigarettes  Smokeless Tobacco  . Never Used    Comment: smoking 15-18 cigarettes daily    Goals Met:    Goals Unmet:    Comments: Medical evaluation completed today. Upon review of chart no ITP created.   Dr. Mark Miller is Medical Director for [facility] and [facility]. 

## 2017-07-05 NOTE — Progress Notes (Signed)
Pulmonary Individual Treatment Plan  Patient Details  Name: Leslie Sims MRN: [id number] Date of Birth: [birth year] Referring Provider:     Pulmonary Rehab from 03/22/2017 in [facility] Cardiac and Pulmonary Rehab  Referring Provider  Tamala Julian      Initial Encounter Date:    Pulmonary Rehab from 03/22/2017 in [facility] Cardiac and Pulmonary Rehab  Date  03/22/17  Referring Provider  Tamala Julian      Visit Diagnosis: Chronic obstructive pulmonary disease, unspecified COPD type ([place])  Patient's Home Medications on Admission:  Current Outpatient Prescriptions:  .  albuterol (PROVENTIL) (2.5 MG/3ML) 0.083% nebulizer solution, USE 1 VIAL (3 ML) BY NEBULIZATION EVERY 6 HOURS AS NEEDED FOR WHEEZING OR SHORTNESS OF BREATH, Disp: 1080 mL, Rfl: 3 .  aspirin 81 MG tablet, Take 81 mg by mouth daily.  , Disp: , Rfl:  .  atorvastatin (LIPITOR) 40 MG tablet, Take 1 tablet (40 mg total) by mouth daily., Disp: 90 tablet, Rfl: 3 .  azelastine (ASTELIN) 0.1 % nasal spray, Place 2 sprays into both nostrils 2 (two) times daily. Use in each nostril as directed, Disp: 30 mL, Rfl: 12 .  CORTIFOAM 10 % rectal foam, PLACE ONE APPLICATION RECTALLY TWICE DAILY, Disp: 15 g, Rfl: 5 .  doxycycline (VIBRAMYCIN) 100 MG capsule, Take 1 capsule (100 mg total) by mouth 2 (two) times daily., Disp: 20 capsule, Rfl: 0 .  FLUoxetine (PROZAC) 20 MG tablet, Take 1 tablet (20 mg total) by mouth daily., Disp: 30 tablet, Rfl: 3 .  fluticasone (FLONASE) 50 MCG/ACT nasal spray, Place 1 spray into both nostrils 2 (two) times daily., Disp: 48 g, Rfl: 3 .  Fluticasone-Salmeterol (ADVAIR DISKUS) 500-50 MCG/DOSE AEPB, Inhale 1 puff into the lungs 2 (two) times daily., Disp: 180 each, Rfl: 3 .  furosemide (LASIX) 20 MG tablet, Take 1 tablet (20 mg total) by mouth daily., Disp: 90 tablet, Rfl: 1 .  LORazepam (ATIVAN) 0.5 MG tablet, Take 1 tablet (0.5 mg total) by mouth 2 (two) times daily as needed for anxiety., Disp: 60 tablet, Rfl: 0 .  losartan  (COZAAR) 25 MG tablet, Take 1 tablet (25 mg total) by mouth daily., Disp: 90 tablet, Rfl: 3 .  Multiple Vitamin (MULTIVITAMIN) capsule, Take 1 capsule by mouth daily., Disp: , Rfl:  .  nicotine (NICODERM CQ - DOSED IN MG/24 HOURS) 21 mg/24hr patch, Place 1 patch (21 mg total) onto the skin daily., Disp: 28 patch, Rfl: 12 .  nicotine polacrilex (NICORETTE) 4 MG gum, Take 1 each (4 mg total) by mouth as needed for smoking cessation., Disp: 220 tablet, Rfl: 0 .  NICOTROL 10 MG inhaler, INHALE CONTENTS OF 1 CARTRIDGE (CONTINUOUS PUFFING) INTO THE LUNGS AS NEEDED FOR SMOKING CESSATION (, Disp: 168 each, Rfl: 4 .  nitroGLYCERIN (NITROSTAT) 0.4 MG SL tablet, Place 1 tablet (0.4 mg total) under the tongue every 5 (five) minutes as needed., Disp: 25 tablet, Rfl: 3 .  PARoxetine (PAXIL-CR) 25 MG 24 hr tablet, TAKE ONE TABLET BY MOUTH EVERY DAY, Disp: 30 tablet, Rfl: 5 .  predniSONE (DELTASONE) 20 MG tablet, Two tablets daily x 5 days then one tablet daily x 5 days, Disp: 15 tablet, Rfl: 0 .  predniSONE (STERAPRED UNI-PAK 21 TAB) 10 MG (21) TBPK tablet, Sig 6 tablet day 1, 5 tablets day 2, 4 tablets day 3,,3tablets day 4, 2 tablets day 5, 1 tablet day 6 take all tablets orally, Disp: 21 tablet, Rfl: 0 .  sodium chloride (OCEAN) 0.65 % SOLN nasal spray,  Place 2 sprays into both nostrils every 2 (two) hours while awake., Disp: 88 mL, Rfl: 5 .  tiotropium (SPIRIVA HANDIHALER) 18 MCG inhalation capsule, Place 1 capsule (18 mcg total) into inhaler and inhale daily., Disp: 90 capsule, Rfl: 3 .  VENTOLIN HFA 108 (90 Base) MCG/ACT inhaler, USE 2 INHALATIONS EVERY 6 HOURS AS NEEDED FOR WHEEZING, Disp: 54 g, Rfl: 0  Past Medical History: Past Medical History:  Diagnosis Date  . Allergic rhinitis   . Anxiety   . Atherosclerosis   . Back pain   . Bronchitis    09-14-11  . CARDIOMYOPATHY 03/24/2010   did not tolerate Toprol and Lisinopril.   . Chest pain   . Chronic airway obstruction, not elsewhere classified   .  Colon polyps 08/2012   colonoscopy; multiple colon polyps; repeat colonoscopy in one year.  [name] COPD (chronic obstructive pulmonary disease) ([place])   . Coronary artery disease 11/2009   Anterior MI with late presentation 11/2009. Cath showed a 100% proximal LAD occlusion, 99% mid RCA. Had a PCI and 2 DES to both LAD and RCA. EF was 35%. Nuclear stress test 05/13: prior anterior/inferior infarcts without ischemic, EF 43%, cardiac cath in 02/14: Widely patent stents with no other obstructive disease. EF 40%   . Depression   . Diabetes mellitus without complication ([place])   . GERD (gastroesophageal reflux disease)   . Headache(784.0)   . Helicobacter pylori (H. pylori)   . Hemorrhoids   . Hernia    inguinal  . Hyperlipidemia   . Hypertension   . Insomnia   . MI (myocardial infarction) ([place])   . Myalgia   . Peptic ulcer   . Shortness of breath dyspnea   . Status post dilation of esophageal narrowing 2000  . Thyroid dysfunction   . Tinea pedis   . Wears dentures    partial upper    Tobacco Use: History  Smoking Status  . Current Every Day Smoker  . Packs/day: 0.50  . Years: 42.00  . Types: Cigarettes  Smokeless Tobacco  . Never Used    Comment: smoking 15-18 cigarettes daily    Labs: Recent Review Flowsheet Data    Labs for ITP Cardiac and Pulmonary Rehab Latest Ref Rng & Units 03/02/2016 06/15/2016 08/24/2016 03/01/2017 06/17/2017   Cholestrol 100 - 199 mg/dL 140 132 123(L) 112 -   LDLCALC 0 - 99 mg/dL 53 40 30 47 -   HDL >39 mg/dL 73 69 68 33(L) -   Trlycerides 0 - 149 mg/dL 71 115 126 158(H) -   Hemoglobin A1c 4.8 - 5.6 % 6.1(H) 6.3 5.9(H) 5.3 5.8(H)       ADL UCSD:   Pulmonary Function Assessment:   Exercise Target Goals:    Exercise Program Goal: Individual exercise prescription set with THRR, safety & activity barriers. Participant demonstrates ability to understand and report RPE using BORG scale, to self-measure pulse accurately, and to acknowledge the importance  of the exercise prescription.  Exercise Prescription Goal: Starting with aerobic activity 30 plus minutes a day, 3 days per week for initial exercise prescription. Provide home exercise prescription and guidelines that participant acknowledges understanding prior to discharge.  Activity Barriers & Risk Stratification:   6 Minute Walk:  Oxygen Initial Assessment:   Oxygen Re-Evaluation:   Oxygen Discharge (Final Oxygen Re-Evaluation):   Initial Exercise Prescription:   Perform Capillary Blood Glucose checks as needed.  Exercise Prescription Changes:   Exercise Comments:   Exercise Goals and Review:  Exercise Goals Re-Evaluation :     Exercise Goals Re-Evaluation    Row Name 05/25/17 1516 06/08/17 1402           Exercise Goal Re-Evaluation   Comments Out since last review. Out since last review.         Discharge Exercise Prescription (Final Exercise Prescription Changes):   Nutrition:  Target Goals: Understanding of nutrition guidelines, daily intake of sodium <1513m, cholesterol <2058m calories 30% from fat and 7% or less from saturated fats, daily to have 5 or more servings of fruits and vegetables.  Biometrics:    Nutrition Therapy Plan and Nutrition Goals:   Nutrition Discharge: Rate Your Plate Scores:   Nutrition Goals Re-Evaluation:   Nutrition Goals Discharge (Final Nutrition Goals Re-Evaluation):   Psychosocial: Target Goals: Acknowledge presence or absence of significant depression and/or stress, maximize coping skills, provide positive support system. Participant is able to verbalize types and ability to use techniques and skills needed for reducing stress and depression.   Initial Review & Psychosocial Screening:   Quality of Life Scores:   PHQ-9: Recent Review Flowsheet Data    [facility] 06/17/2017 03/22/2017 01/15/2017 12/15/2016 11/18/2016   Decreased Interest 0 2 0 1 3   Down, Depressed, Hopeless 0 2 0 1 3    PHQ - 2 Score 0 4 0 2 6   Altered sleeping - 2 - 0 2   Tired, decreased energy - 2 - 3 2   Change in appetite - 2 - 0 2   Feeling bad or failure about yourself  - 2 - 0 3   Trouble concentrating - 2 - 3 3   Moving slowly or fidgety/restless - 2 - 3 0   Suicidal thoughts - 0 - 0 0   PHQ-9 Score - 16 - 11 18   Difficult doing work/chores - Somewhat difficult - - -     Interpretation of Total Score  Total Score Depression Severity:  1-4 = Minimal depression, 5-9 = Mild depression, 10-14 = Moderate depression, 15-19 = Moderately severe depression, 20-27 = Severe depression   Psychosocial Evaluation and Intervention:     Psychosocial Evaluation - 05/09/17 1214      Psychosocial Evaluation & Interventions   Interventions Encouraged to exercise with the program and follow exercise prescription;Stress management education;Relaxation education   Comments Counselor met with [facility] for initial psychosocial evaluation.  He is a [age]ear old who has Chronic Bronchitis; emphysema; and COPD.  He has a strong support system with a spouse of over 30 years; adult children who live close by and participates in his local church.  Eric had heart stents inserted 8-9 years ago.  He reports "not sleeping well" but having a good appetite.  He also admits to a history of depression and anxiety and is on medications that help with any current symptoms.  His PHQ-9 scores are 16 indicating moderately severe depression.   However, he reports typically being in a positive mood.  JoAntrellas multiple stressors which can be impacting his depression inventory with his own health and that of his spouse who had a stroke and he is having to be a caregiver to some degree.  Perrion also reports it is stressful for him to try to quit smoking; reporting smokes approx. 1 pack per day - down from 2-3 packs several years ago and has smoked for 45-50 years.  [name] is using patches; Nicorette gum and Nicatrol  to help  with this.  Counselor encouraged him to try cutting out one cigarette per week as a means of smoking reduction and he agreed to give it a try.  He has goals to "get rid of the cigarettes," and to just feel better and stronger overall.  Counselor and staff will continue to monitor his progress throughout the course of this program.      Expected Outcomes Cour[place]ier will benefit from consistent exercise to achieve his stated goals.  He will also benefit from the stress management and relaxation education to help with positive coping strategies for his current stressors.  Staff will follow with Edilson to encourage & provide educational information for smoking cessation.     Continue Psychosocial Services  Follow up required by staff      Psychosocial Re-Evaluation:   Psychosocial Discharge (Final Psychosocial Re-Evaluation):   Education: Education Goals: Education classes will be provided on a weekly basis, covering required topics. Participant will state understanding/return demonstration of topics presented.  Learning Barriers/Preferences:   Education Topics: Initial Evaluation Education: - Verbal, written and demonstration of respiratory meds, RPE/PD scales, oximetry and breathing techniques. Instruction on use of nebulizers and MDIs: cleaning and proper use, rinsing mouth with steroid doses and importance of monitoring MDI activations.   Pulmonary Rehab from 03/22/2017 in [facility] Cardiac and Pulmonary Rehab  Date  03/22/17  Educator  LB  Instruction Review Code  2- meets goals/outcomes      General Nutrition Guidelines/Fats and Fiber: -Group instruction provided by verbal, written material, models and posters to present the general guidelines for heart healthy nutrition. Gives an explanation and review of dietary fats and fiber.   Pulmonary Rehab from 04/18/2017 in [facility] Cardiac and Pulmonary Rehab  Date  04/18/17  Educator  CR  Instruction Review Code  2- meets goals/outcomes       Controlling Sodium/Reading Food Labels: -Group verbal and written material supporting the discussion of sodium use in heart healthy nutrition. Review and explanation with models, verbal and written materials for utilization of the food label.   Exercise Physiology & Risk Factors: - Group verbal and written instruction with models to review the exercise physiology of the cardiovascular system and associated critical values. Details cardiovascular disease risk factors and the goals associated with each risk factor.   Aerobic Exercise & Resistance Training: - Gives group verbal and written discussion on the health impact of inactivity. On the components of aerobic and resistive training programs and the benefits of this training and how to safely progress through these programs.   Flexibility, Balance, General Exercise Guidelines: - Provides group verbal and written instruction on the benefits of flexibility and balance training programs. Provides general exercise guidelines with specific guidelines to those with heart or lung disease. Demonstration and skill practice provided.   Stress Management: - Provides group verbal and written instruction about the health risks of elevated stress, cause of high stress, and healthy ways to reduce stress.   Depression: - Provides group verbal and written instruction on the correlation between heart/lung disease and depressed mood, treatment options, and the stigmas associated with seeking treatment.   Exercise & Equipment Safety: - Individual verbal instruction and demonstration of equipment use and safety with use of the equipment.   Pulmonary Rehab from 04/18/2017 in [facility] Cardiac and Pulmonary Rehab  Date  04/04/17  Educator  AS  Instruction Review Code  2- meets goals/outcomes      Infection Prevention: - Provides verbal and written material to individual with discussion of  infection control including proper hand washing and proper  equipment cleaning during exercise session.   Pulmonary Rehab from 04/18/2017 in [facility] Cardiac and Pulmonary Rehab  Date  04/04/17  Educator  AS  Instruction Review Code  2- meets goals/outcomes      Falls Prevention: - Provides verbal and written material to individual with discussion of falls prevention and safety.   Diabetes: - Individual verbal and written instruction to review signs/symptoms of diabetes, desired ranges of glucose level fasting, after meals and with exercise. Advice that pre and post exercise glucose checks will be done for 3 sessions at entry of program.   Chronic Lung Diseases: - Group verbal and written instruction to review new updates, new respiratory medications, new advancements in procedures and treatments. Provide informative websites and "800" numbers of self-education.   Lung Procedures: - Group verbal and written instruction to describe testing methods done to diagnose lung disease. Review the outcome of test results. Describe the treatment choices: Pulmonary Function Tests, ABGs and oximetry.   Energy Conservation: - Provide group verbal and written instruction for methods to conserve energy, plan and organize activities. Instruct on pacing techniques, use of adaptive equipment and posture/positioning to relieve shortness of breath.   Triggers: - Group verbal and written instruction to review types of environmental controls: home humidity, furnaces, filters, dust mite/pet prevention, HEPA vacuums. To discuss weather changes, air quality and the benefits of nasal washing.   Exacerbations: - Group verbal and written instruction to provide: warning signs, infection symptoms, calling MD promptly, preventive modes, and value of vaccinations. Review: effective airway clearance, coughing and/or vibration techniques. Create an Sports administrator.   Oxygen: - Individual and group verbal and written instruction on oxygen therapy. Includes supplement oxygen,  available portable oxygen systems, continuous and intermittent flow rates, oxygen safety, concentrators, and Medicare reimbursement for oxygen.   Pulmonary Rehab from 03/22/2017 in [facility] Cardiac and Pulmonary Rehab  Date  03/22/17  Educator  LB  Instruction Review Code  2- meets goals/outcomes      Respiratory Medications: - Group verbal and written instruction to review medications for lung disease. Drug class, frequency, complications, importance of spacers, rinsing mouth after steroid MDI's, and proper cleaning methods for nebulizers.   Pulmonary Rehab from 03/22/2017 in [facility] Cardiac and Pulmonary Rehab  Date  03/22/17  Educator  LB  Instruction Review Code  2- meets goals/outcomes      AED/CPR: - Group verbal and written instruction with the use of models to demonstrate the basic use of the AED with the basic ABC's of resuscitation.   Breathing Retraining: - Provides individuals verbal and written instruction on purpose, frequency, and proper technique of diaphragmatic breathing and pursed-lipped breathing. Applies individual practice skills.   Pulmonary Rehab from 03/22/2017 in [facility] Cardiac and Pulmonary Rehab  Date  03/22/17  Educator  LB  Instruction Review Code  2- meets goals/outcomes      Anatomy and Physiology of the Lungs: - Group verbal and written instruction with the use of models to provide basic lung anatomy and physiology related to function, structure and complications of lung disease.   Heart Failure: - Group verbal and written instruction on the basics of heart failure: signs/symptoms, treatments, explanation of ejection fraction, enlarged heart and cardiomyopathy.   Sleep Apnea: - Individual verbal and written instruction to review Obstructive Sleep Apnea. Review of risk factors, methods for diagnosing and types of masks and machines for OSA.   Anxiety: - Provides group, verbal and written instruction on  the correlation between heart/lung disease and anxiety,  treatment options, and management of anxiety.   Relaxation: - Provides group, verbal and written instruction about the benefits of relaxation for patients with heart/lung disease. Also provides patients with examples of relaxation techniques.   Knowledge Questionnaire Score:    Core Components/Risk Factors/Patient Goals at Admission:   Core Components/Risk Factors/Patient Goals Review:      Goals and Risk Factor Review    Row Name 05/18/17 1510             Core Components/Risk Factors/Patient Goals Review   Personal Goals Review Weight Management/Obesity;Improve shortness of breath with ADL's;Increase knowledge of respiratory medications and ability to use respiratory devices properly.;Develop more efficient breathing techniques such as purse lipped breathing and diaphragmatic breathing and practicing self-pacing with activity.;Tobacco Cessation;Hypertension;Lipids       Review Mr Schubring has completed 4 sessions of [facility], but has not attended regularly. He still works and does take care of his wife who had a stroke. Mr Haisley has made some increased with his exercise goals and does pace himself and uses PLB with his goals and home activities. He was a 1 and 1/2ppd, has decreased to 1dd, but he has difficulty smoking less than the pack. Mr Goettel is using nicotine gum. He has a good understanding of his MDI's and other medication and is compliant. He would like to gain 10-15lbs, and hopefully we can set him up with the dietitian.       Expected Outcomes Continue exercise with regualr attendance and continue working on smoking cessation.          Core Components/Risk Factors/Patient Goals at Discharge (Final Review):      Goals and Risk Factor Review - 05/18/17 1510      Core Components/Risk Factors/Patient Goals Review   Personal Goals Review Weight Management/Obesity;Improve shortness of breath with ADL's;Increase knowledge of respiratory medications and ability to use  respiratory devices properly.;Develop more efficient breathing techniques such as purse lipped breathing and diaphragmatic breathing and practicing self-pacing with activity.;Tobacco Cessation;Hypertension;Lipids   Review Mr Schreur has completed 4 sessions of [facility], but has not attended regularly. He still works and does take care of his wife who had a stroke. Mr Elsass has made some increased with his exercise goals and does pace himself and uses PLB with his goals and home activities. He was a 1 and 1/2ppd, has decreased to 1dd, but he has difficulty smoking less than the pack. Mr Basaldua is using nicotine gum. He has a good understanding of his MDI's and other medication and is compliant. He would like to gain 10-15lbs, and hopefully we can set him up with the dietitian.   Expected Outcomes Continue exercise with regualr attendance and continue working on smoking cessation.      ITP Comments:     ITP Comments    Row Name 05/23/17 0837 05/25/17 1516 06/08/17 1401 06/20/17 0801 06/24/17 1404   ITP Comments 30 day note review by Dr Emily Filbert, Medical Director of [facility] since last review. Left message on voice mail. Called to check on status of return.  He stated he has not been feeling up to coming.  He stated that he will be back on Monday. 30 day note review with Dr Emily Filbert, Medical Director of [facility] Left a message for Mr. Dupee to his wife to see if he was ok and if he would be returning to class. His wife said that she hopes so and would have  him call back later.   [place] Name 07/05/17 1557           ITP Comments sent patient letter for discharg. Patient has not returned calls and has not attended since 05/30/17          Comments: discharge ITP

## 2017-07-05 NOTE — Progress Notes (Signed)
Discharge Summary  Patient Details  Name: Leslie Sims MRN: [id number] Date of Birth: Mar 28, 1951 Referring Provider:     Pulmonary Rehab from 03/22/2017 in [facility]  Referring Provider  Tamala Julian       Number of Visits: 5/36  Reason for Discharge:  Early Exit:  patient has not been attending  Smoking History:  History  Smoking Status  . Current Every Day Smoker  . Packs/day: 0.50  . Years: 42.00  . Types: Cigarettes  Smokeless Tobacco  . Never Used    Comment: smoking 15-18 cigarettes daily    Diagnosis:  Chronic obstructive pulmonary disease, unspecified COPD type ([place])  ADL UCSD:   Initial Exercise Prescription:   Discharge Exercise Prescription (Final Exercise Prescription Changes):   Functional Capacity:   Psychological, QOL, Others - Outcomes: PHQ 2/9: Depression screen [facility] 2/9 06/17/2017 03/22/2017 01/15/2017 12/15/2016 11/18/2016  Decreased Interest 0 2 0 1 3  Down, Depressed, Hopeless 0 2 0 1 3  PHQ - 2 Score 0 4 0 2 6  Altered sleeping - 2 - 0 2  Tired, decreased energy - 2 - 3 2  Change in appetite - 2 - 0 2  Feeling bad or failure about yourself  - 2 - 0 3  Trouble concentrating - 2 - 3 3  Moving slowly or fidgety/restless - 2 - 3 0  Suicidal thoughts - 0 - 0 0  PHQ-9 Score - 16 - 11 18  Difficult doing work/chores - Somewhat difficult - - -  Some recent data might be hidden    Quality of Life:   Personal Goals: Goals established at orientation with interventions provided to work toward goal.    Personal Goals Discharge:     Goals and Risk Factor Review    Row Name 05/18/17 1510             Core Components/Risk Factors/Patient Goals Review   Personal Goals Review Weight Management/Obesity;Improve shortness of breath with ADL's;Increase knowledge of respiratory medications and ability to use respiratory devices properly.;Develop more efficient breathing techniques such as purse lipped breathing and  diaphragmatic breathing and practicing self-pacing with activity.;Tobacco Cessation;Hypertension;Lipids       Review [name] has completed 4 sessions of [facility], but has not attended regularly. He still works and does take care of his wife who had a stroke. [name] has made some increased with his exercise goals and does pace himself and uses PLB with his goals and home activities. He was a 1 and 1/2ppd, has decreased to 1dd, but he has difficulty smoking less than the pack. [name] is using nicotine gum. He has a good understanding of his MDI's and other medication and is compliant. He would like to gain 10-15lbs, and hopefully we can set him up with the dietitian.       Expected Outcomes Continue exercise with regualr attendance and continue working on smoking cessation.          Nutrition & Weight - Outcomes:    Nutrition:   Nutrition Discharge:   Education Questionnaire Score:   Goals reviewed with patient; copy given to patient.

## 2017-07-06 ENCOUNTER — Encounter: Payer: BLUE CROSS/BLUE SHIELD | Admitting: Family Medicine

## 2017-07-07 ENCOUNTER — Telehealth: Payer: Self-pay | Admitting: Family Medicine

## 2017-07-07 NOTE — Telephone Encounter (Signed)
Pt states he would like to talk to you, he would not tell me about what.  Contact number [phone]

## 2017-07-07 NOTE — Telephone Encounter (Signed)
FYI. Pt would like for you to know that his wife Waris Rodger DOB 05/17/53 is in the hospital.

## 2017-07-08 NOTE — Telephone Encounter (Signed)
Spoke with patient regarding status of wife (admitted [facility] for acute lower back pain and subsequently diagnosed with acute leukemia; currently in ICU).

## 2017-07-15 ENCOUNTER — Telehealth: Payer: Self-pay | Admitting: Family Medicine

## 2017-07-15 MED ORDER — SUCRALFATE 1 GM/10ML PO SUSP: 1.0000 g | Freq: Three times a day (TID) | ORAL | 4 refills | Status: DC

## 2017-07-15 NOTE — Telephone Encounter (Signed)
Patient called requesting refill of Carafate. Wife in ICU at [facility].  Not eating well and stress.  A/P: GERD with acute stress: refill of Carafate sent to [facility].

## 2017-07-22 ENCOUNTER — Telehealth: Payer: Self-pay | Admitting: Family Medicine

## 2017-07-22 NOTE — Telephone Encounter (Signed)
Pt advised the he will have to wait until appointment on 07/27/17 to have refills.

## 2017-07-22 NOTE — Telephone Encounter (Signed)
PATIENT WOULD LIKE DR. Tamala Julian TO KNOW THAT IT IS TIME TO GET SOME OF HIS REFILLS. 1. FUROSEMIDE (LASIX) 20 MG   2. ADVAIR 550   3. TIOTROPIUM (SPIRIVA HANDIHALER) 18 MCG   4. VENTOLIN HFA INHALER  BEST PHONE [phone] (HOME) PHARMACY CHOICE IS [facility]. [place]

## 2017-07-27 ENCOUNTER — Ambulatory Visit (INDEPENDENT_AMBULATORY_CARE_PROVIDER_SITE_OTHER): Payer: BLUE CROSS/BLUE SHIELD | Admitting: Family Medicine

## 2017-07-27 ENCOUNTER — Encounter: Payer: Self-pay | Admitting: Family Medicine

## 2017-07-27 VITALS — BP 108/62 | HR 87 | Temp 98.9°F | Resp 16 | Ht 68.0 in | Wt 143.8 lb

## 2017-07-27 DIAGNOSIS — Z0000 Encounter for general adult medical examination without abnormal findings: Secondary | ICD-10-CM | POA: Diagnosis not present

## 2017-07-27 DIAGNOSIS — F341 Dysthymic disorder: Secondary | ICD-10-CM | POA: Diagnosis not present

## 2017-07-27 DIAGNOSIS — J449 Chronic obstructive pulmonary disease, unspecified: Secondary | ICD-10-CM

## 2017-07-27 DIAGNOSIS — E7800 Pure hypercholesterolemia, unspecified: Secondary | ICD-10-CM | POA: Diagnosis not present

## 2017-07-27 DIAGNOSIS — Z125 Encounter for screening for malignant neoplasm of prostate: Secondary | ICD-10-CM | POA: Diagnosis not present

## 2017-07-27 DIAGNOSIS — E7439 Other disorders of intestinal carbohydrate absorption: Secondary | ICD-10-CM | POA: Diagnosis not present

## 2017-07-27 DIAGNOSIS — Z86010 Personal history of colonic polyps: Secondary | ICD-10-CM | POA: Diagnosis not present

## 2017-07-27 DIAGNOSIS — I25118 Atherosclerotic heart disease of native coronary artery with other forms of angina pectoris: Secondary | ICD-10-CM | POA: Diagnosis not present

## 2017-07-27 DIAGNOSIS — J3089 Other allergic rhinitis: Secondary | ICD-10-CM

## 2017-07-27 DIAGNOSIS — J302 Other seasonal allergic rhinitis: Secondary | ICD-10-CM

## 2017-07-27 DIAGNOSIS — F17200 Nicotine dependence, unspecified, uncomplicated: Secondary | ICD-10-CM

## 2017-07-27 DIAGNOSIS — K219 Gastro-esophageal reflux disease without esophagitis: Secondary | ICD-10-CM

## 2017-07-27 LAB — POCT URINALYSIS DIP (MANUAL ENTRY)
BILIRUBIN UA: NEGATIVE
Blood, UA: NEGATIVE
GLUCOSE UA: NEGATIVE mg/dL
Ketones, POC UA: NEGATIVE mg/dL
LEUKOCYTES UA: NEGATIVE
NITRITE UA: NEGATIVE
PH UA: 6 (ref 5.0–8.0)
Protein Ur, POC: NEGATIVE mg/dL
Spec Grav, UA: 1.01 (ref 1.010–1.025)
UROBILINOGEN UA: 0.2 U/dL

## 2017-07-27 MED ORDER — SERTRALINE HCL 50 MG PO TABS: 50.0000 mg | ORAL_TABLET | Freq: Every day | ORAL | 1 refills | Status: DC

## 2017-07-27 MED ORDER — TIOTROPIUM BROMIDE MONOHYDRATE 18 MCG IN CAPS
18.0000 ug | ORAL_CAPSULE | Freq: Every day | RESPIRATORY_TRACT | 3 refills | Status: DC
Start: 2017-07-27 — End: 2018-07-26

## 2017-07-27 MED ORDER — ALBUTEROL SULFATE HFA 108 (90 BASE) MCG/ACT IN AERS: INHALATION_SPRAY | RESPIRATORY_TRACT | 3 refills | Status: DC

## 2017-07-27 MED ORDER — FUROSEMIDE 20 MG PO TABS: 20.0000 mg | ORAL_TABLET | Freq: Every day | ORAL | 1 refills | Status: DC

## 2017-07-27 MED ORDER — FLUTICASONE-SALMETEROL 500-50 MCG/DOSE IN AEPB: 1.0000 | INHALATION_SPRAY | Freq: Two times a day (BID) | RESPIRATORY_TRACT | 3 refills | Status: DC

## 2017-07-27 NOTE — Progress Notes (Signed)
Subjective:    Patient ID: Leslie Sims, male    DOB: [birth year], 66 y.o.   MRN: [id number]   HPI This 66 y.o. male presents for Complete Physical Exmaniation.  Wife passed away of ALL since last visit.  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  Coping moderately well at this time.  Decreased activity. Sleeping a lot.  Crying a lot.  Denies SI.   BP Readings from Last 3 Encounters:  07/27/17 108/62  06/30/17 128/66  06/17/17 108/63   Wt Readings from Last 3 Encounters:  07/27/17 143 lb 12.8 oz (65.2 kg)  06/17/17 150 lb 3.2 oz (68.1 kg)  03/22/17 149 lb 14.4 oz (68 kg)   Immunization History  Administered Date(s) Administered  . Influenza Whole 09/19/2012  . Influenza,inj,Quad PF,36+ Mos 10/24/2013, 11/25/2014, 03/02/2016, 10/27/2016  . Pneumococcal Conjugate-13 11/25/2014  . Pneumococcal Polysaccharide-23 08/24/2016, 10/27/2016  . Pneumococcal-Unspecified 12/20/2008  . Tdap 12/20/2008     Visual Acuity Screening   Right eye Left eye Both eyes  Without correction: 20/20 20/20 20/20   With correction:       Review of Systems  Constitutional: Negative for activity change, appetite change, chills, diaphoresis, fatigue, fever and unexpected weight change.  HENT: Negative for congestion, dental problem, drooling, ear discharge, ear pain, facial swelling, hearing loss, mouth sores, nosebleeds, postnasal drip, rhinorrhea, sinus pressure, sneezing, sore throat, tinnitus, trouble swallowing and voice change.   Eyes: Negative for photophobia, pain, discharge, redness, itching and visual disturbance.  Respiratory: Positive for cough, choking, chest tightness, shortness of breath and wheezing. Negative for apnea and stridor.   Cardiovascular:  Negative for chest pain, palpitations and leg swelling.  Gastrointestinal: Negative for abdominal pain, blood in stool, constipation, diarrhea, nausea and vomiting.  Endocrine: Negative for cold intolerance, heat intolerance, polydipsia, polyphagia and polyuria.  Genitourinary: Negative for decreased urine volume, difficulty urinating, discharge, dysuria, enuresis, flank pain, frequency, genital sores, hematuria, penile pain, penile swelling, scrotal swelling, testicular pain and urgency.  Musculoskeletal: Positive for myalgias. Negative for arthralgias, back pain, gait problem, joint swelling, neck pain and neck stiffness.  Skin: Negative for color change, pallor, rash and wound.  Allergic/Immunologic: Negative for environmental allergies, food allergies and immunocompromised state.  Neurological: Positive for weakness and headaches. Negative for dizziness, tremors, seizures, syncope, facial asymmetry, speech difficulty, light-headedness and numbness.  Hematological: Negative for adenopathy. Does not bruise/bleed easily.  Psychiatric/Behavioral: Positive for dysphoric mood. Negative for agitation, behavioral problems, confusion, decreased concentration, hallucinations, self-injury, sleep disturbance and suicidal ideas. The patient is not nervous/anxious and is not hyperactive.    Past Medical History:  Diagnosis Date  . Allergic rhinitis   . Anxiety   . Atherosclerosis   . Back pain   . Bronchitis    09-14-11  . CARDIOMYOPATHY 03/24/2010   did not tolerate Toprol and Lisinopril.   . Chest pain   . Chronic airway obstruction, not elsewhere classified   . Colon polyps 08/2012   colonoscopy; multiple colon polyps; repeat colonoscopy in one year.  [name] COPD (chronic obstructive pulmonary disease) ([place])   . Coronary artery disease 11/2009   Anterior MI with late presentation 11/2009. Cath showed a 100% proximal LAD occlusion, 99% mid RCA. Had a PCI and 2 DES to both LAD and RCA.  EF was 35%. Nuclear  stress test 05/13: prior anterior/inferior infarcts without ischemic, EF 43%, cardiac cath in 02/14: Widely patent stents with no other obstructive disease. EF 40%   . Depression   . Diabetes mellitus without complication ([place])   . GERD (gastroesophageal reflux disease)   . Headache(784.0)   . Helicobacter pylori (H. pylori)   . Hemorrhoids   . Hernia    inguinal  . Hyperlipidemia   . Hypertension   . Insomnia   . MI (myocardial infarction) ([place])   . Myalgia   . Peptic ulcer   . Shortness of breath dyspnea   . Status post dilation of esophageal narrowing 2000  . Thyroid dysfunction   . Tinea pedis   . Wears dentures    partial upper   Past Surgical History:  Procedure Laterality Date  . Admission  12/20/2012   COPD exacerbation.  [place].  [name] CARDIAC CATHETERIZATION    . CARDIAC CATHETERIZATION  02/05/13   [facility]  . CARDIAC CATHETERIZATION  10/14   [facility] : patent stents with no change in anatomy. EF: 40$  . CARDIAC CATHETERIZATION Left 11/12/2016   Procedure: Left Heart Cath and Coronary Angiography;  Surgeon: Wellington Hampshire, MD;  Location: [facility];  Service: Cardiovascular;  Laterality: Left;  . COLONOSCOPY    . COLONOSCOPY WITH PROPOFOL N/A 05/18/2016   Procedure: COLONOSCOPY WITH PROPOFOL;  Surgeon: Lucilla Lame, MD;  Location: [facility] ENDOSCOPY;  Service: Endoscopy;  Laterality: N/A;  . CORONARY ANGIOPLASTY WITH STENT PLACEMENT  09/2009   LAD 3.0 X23 mm Xience DES, RCA: 4.0 X 15 mm Xience DES  . ESOPHAGEAL DILATION    . ESOPHAGOGASTRODUODENOSCOPY  2008  . ESOPHAGOGASTRODUODENOSCOPY  08/20/2012  . HERNIA REPAIR  07/20/2012   L inguinal hernia repair  . PENILE PROSTHESIS IMPLANT     Allergies  Allergen Reactions  . Effexor Xr [Venlafaxine Hcl Er]   . Wellbutrin [Bupropion] Other (See Comments)    Unknown/"made me feel real funny"   Social History   Social History  . Marital status: Married    Spouse name: N/A  . Number of children: 5  . Years of education: N/A     Occupational History  . 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 Smoking status: Current Every Day Smoker    Packs/day: 0.50    Years: 42.00    Types: Cigarettes  . Smokeless tobacco: Never Used     Comment: smoking 15-18 cigarettes daily  . Alcohol use No  . Drug use: No  . Sexual activity: Yes   Other Topics Concern  . Not on file   Social History Narrative   Marital status: married x 32 years; happily married.      Children: 4 children;  1 stepchild and 15 grandchildren; no great grandchildren.      Lives: with wife; 2 dogs, 1 cat.      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      Tobacco: 1 ppd x 45 years      Alcohol: none; previous alcoholism      Drugs: none      Exercise: no formal exercise; physically demanding job; owns horses.      Seatbelts:  100%      Guns: one loaded unsecured gun in the home.      Sunscreen: none        Objective:   Physical Exam  Constitutional: He is oriented to person,  place, and time. He appears well-developed and well-nourished. No distress.  HENT:  Head: Normocephalic and atraumatic.  Right Ear: External ear normal.  Left Ear: External ear normal.  Nose: Nose normal.  Mouth/Throat: Oropharynx is clear and moist.  Eyes: Pupils are equal, round, and reactive to light. Conjunctivae and EOM are normal.  Neck: Normal range of motion. Neck supple. Carotid bruit is not present. No thyromegaly present.  Cardiovascular: Normal rate, regular rhythm, normal heart sounds and intact distal pulses.  Exam reveals no gallop and no friction rub.   No murmur heard. Pulmonary/Chest: Effort normal and breath sounds normal. He has no wheezes. He has no rales.  Abdominal: Soft. Bowel sounds are normal. He exhibits no distension and no mass. There is no tenderness. There is no rebound and no guarding. A hernia is present. Hernia confirmed positive in the right inguinal area. Hernia confirmed negative  in the left inguinal area.  Genitourinary: Testes normal and penis normal.  Musculoskeletal:       Right shoulder: Normal.       Left shoulder: Normal.       Cervical back: Normal.  Lymphadenopathy:    He has no cervical adenopathy.  Neurological: He is alert and oriented to person, place, and time. He has normal reflexes. No cranial nerve deficit. He exhibits normal muscle tone. Coordination normal.  Skin: Skin is warm and dry. No rash noted. He is not diaphoretic.  Psychiatric: He has a normal mood and affect. His behavior is normal. Judgment and thought content normal.   Depression screen [facility] 2/9 07/27/2017 06/17/2017 03/22/2017 01/15/2017 12/15/2016  Decreased Interest 3 0 2 0 1  Down, Depressed, Hopeless 3 0 2 0 1  PHQ - 2 Score 6 0 4 0 2  Altered sleeping 3 - 2 - 0  Tired, decreased energy 3 - 2 - 3  Change in appetite 3 - 2 - 0  Feeling bad or failure about yourself  1 - 2 - 0  Trouble concentrating 3 - 2 - 3  Moving slowly or fidgety/restless 2 - 2 - 3  Suicidal thoughts 0 - 0 - 0  PHQ-9 Score 21 - 16 - 11  Difficult doing work/chores Somewhat difficult - Somewhat difficult - -  Some recent data might be hidden   Fall Risk  07/27/2017 06/17/2017 03/22/2017 01/15/2017 12/15/2016  Falls in the past year? No No No No No       Assessment & Plan:  1. Routine physical examination -anticipatory guidance provided --- exercise, weight loss, safe driving practices, aspirin 81mg  daily. -obtain age appropriate screening labs and labs for chronic disease management.  2. Atherosclerosis of native coronary artery of native heart with other form of angina pectoris ([place])  3. COPD, very severe ([place]) -persistent; encourage smoking cessation; plans to join [facility] smoking cessation program with daughter.  4. Seasonal and perennial allergic rhinitis  5. Gastroesophageal reflux disease without esophagitis - Comprehensive metabolic panel - POCT urinalysis dipstick  6. DEPRESSION/ANXIETY -worsening  due to acute grief reaction from death of wife.  Rx for Zoloft 50mg  daily provided.   7. Pure hypercholesterolemia -controlled - Lipid panel - Comprehensive metabolic panel  8. Personal history of colonic polyps 9. SMOKER -encourage cessation.  10. Glucose intolerance - HM Diabetes Foot Exam  11. Screening for prostate cancer - PSA - POCT urinalysis dipstick   Meds ordered this encounter  Medications  . DISCONTD: sertraline (ZOLOFT) 50 MG tablet    Sig: Take 1 tablet (50  mg total) by mouth daily.    Dispense:  90 tablet    Refill:  1  . DISCONTD: albuterol (VENTOLIN HFA) 108 (90 Base) MCG/ACT inhaler    Sig: USE 2 INHALATIONS EVERY 6 HOURS AS NEEDED FOR WHEEZING    Dispense:  48 g    Refill:  3  . tiotropium (SPIRIVA HANDIHALER) 18 MCG inhalation capsule    Sig: Place 1 capsule (18 mcg total) into inhaler and inhale daily.    Dispense:  90 capsule    Refill:  3  . furosemide (LASIX) 20 MG tablet    Sig: Take 1 tablet (20 mg total) by mouth daily.    Dispense:  90 tablet    Refill:  1  . Fluticasone-Salmeterol (ADVAIR DISKUS) 500-50 MCG/DOSE AEPB    Sig: Inhale 1 puff into the lungs 2 (two) times daily.    Dispense:  180 each    Refill:  3  . sertraline (ZOLOFT) 50 MG tablet    Sig: Take 1 tablet (50 mg total) by mouth daily.    Dispense:  30 tablet    Refill:  0  . albuterol (VENTOLIN HFA) 108 (90 Base) MCG/ACT inhaler    Sig: USE 2 INHALATIONS EVERY 6 HOURS AS NEEDED FOR WHEEZING    Dispense:  48 g    Refill:  3    PLEASE DISPENSE ALBUTEROL HFA THAT INSURANCE PREFERS.    Donevan Biller Elayne Guerin, M.D. Primary Care at [facility] previously [facility] [address] [place], [place]  [address] [phone] phone [phone] fax

## 2017-07-27 NOTE — Patient Instructions (Addendum)
   IF you received an x-ray today, you will receive an invoice from [facility]. Please contact [facility] at [phone] with questions or concerns regarding your invoice.   IF you received labwork today, you will receive an invoice from [facility]. Please contact [facility] at [phone] with questions or concerns regarding your invoice.   Our billing staff will not be able to assist you with questions regarding bills from these companies.  You will be contacted with the lab results as soon as they are available. The fastest way to get your results is to activate your My Chart account. Instructions are located on the last page of this paperwork. If you have not heard from us regarding the results in 2 weeks, please contact this office.     Preventive Care 40-64 Years, Male Preventive care refers to lifestyle choices and visits with your health care provider that can promote health and wellness. What does preventive care include?  A yearly physical exam. This is also called an annual well check.  Dental exams once or twice a year.  Routine eye exams. Ask your health care provider how often you should have your eyes checked.  Personal lifestyle choices, including: ? Daily care of your teeth and gums. ? Regular physical activity. ? Eating a healthy diet. ? Avoiding tobacco and drug use. ? Limiting alcohol use. ? Practicing safe sex. ? Taking low-dose aspirin every day starting at age 50. What happens during an annual well check? The services and screenings done by your health care provider during your annual well check will depend on your age, overall health, lifestyle risk factors, and family history of disease. Counseling Your health care provider may ask you questions about your:  Alcohol use.  Tobacco use.  Drug use.  Emotional well-being.  Home and relationship well-being.  Sexual activity.  Eating habits.  Work and work  environment.  Screening You may have the following tests or measurements:  Height, weight, and BMI.  Blood pressure.  Lipid and cholesterol levels. These may be checked every 5 years, or more frequently if you are over 50 years old.  Skin check.  Lung cancer screening. You may have this screening every year starting at age 55 if you have a 30-pack-year history of smoking and currently smoke or have quit within the past 15 years.  Fecal occult blood test (FOBT) of the stool. You may have this test every year starting at age 50.  Flexible sigmoidoscopy or colonoscopy. You may have a sigmoidoscopy every 5 years or a colonoscopy every 10 years starting at age 50.  Prostate cancer screening. Recommendations will vary depending on your family history and other risks.  Hepatitis C blood test.  Hepatitis B blood test.  Sexually transmitted disease (STD) testing.  Diabetes screening. This is done by checking your blood sugar (glucose) after you have not eaten for a while (fasting). You may have this done every 1-3 years.  Discuss your test results, treatment options, and if necessary, the need for more tests with your health care provider. Vaccines Your health care provider may recommend certain vaccines, such as:  Influenza vaccine. This is recommended every year.  Tetanus, diphtheria, and acellular pertussis (Tdap, Td) vaccine. You may need a Td booster every 10 years.  Varicella vaccine. You may need this if you have not been vaccinated.  Zoster vaccine. You may need this after age 60.  Measles, mumps, and rubella (MMR) vaccine. You may need at least one dose of   MMR if you were born in 1957 or later. You may also need a second dose.  Pneumococcal 13-valent conjugate (PCV13) vaccine. You may need this if you have certain conditions and have not been vaccinated.  Pneumococcal polysaccharide (PPSV23) vaccine. You may need one or two doses if you smoke cigarettes or if you have  certain conditions.  Meningococcal vaccine. You may need this if you have certain conditions.  Hepatitis A vaccine. You may need this if you have certain conditions or if you travel or work in places where you may be exposed to hepatitis A.  Hepatitis B vaccine. You may need this if you have certain conditions or if you travel or work in places where you may be exposed to hepatitis B.  Haemophilus influenzae type b (Hib) vaccine. You may need this if you have certain risk factors.  Talk to your health care provider about which screenings and vaccines you need and how often you need them. This information is not intended to replace advice given to you by your health care provider. Make sure you discuss any questions you have with your health care provider. Document Released: 01/02/2016 Document Revised: 08/25/2016 Document Reviewed: 10/07/2015 Elsevier Interactive Patient Education  2017 [employer].

## 2017-07-28 ENCOUNTER — Telehealth: Payer: Self-pay | Admitting: Family Medicine

## 2017-07-28 LAB — COMPREHENSIVE METABOLIC PANEL
A/G RATIO: 2.4 — AB (ref 1.2–2.2)
ALBUMIN: 4.6 g/dL (ref 3.6–4.8)
ALK PHOS: 77 IU/L (ref 39–117)
ALT: 20 IU/L (ref 0–44)
AST: 18 IU/L (ref 0–40)
BILIRUBIN TOTAL: 0.4 mg/dL (ref 0.0–1.2)
BUN / CREAT RATIO: 15 (ref 10–24)
BUN: 11 mg/dL (ref 8–27)
CHLORIDE: 98 mmol/L (ref 96–106)
CO2: 30 mmol/L — ABNORMAL HIGH (ref 20–29)
Calcium: 9.8 mg/dL (ref 8.6–10.2)
Creatinine, Ser: 0.75 mg/dL — ABNORMAL LOW (ref 0.76–1.27)
GFR calc non Af Amer: 98 mL/min/{1.73_m2} (ref >59)
GFR, EST AFRICAN AMERICAN: 113 mL/min/{1.73_m2} (ref >59)
GLOBULIN, TOTAL: 1.9 g/dL (ref 1.5–4.5)
Glucose: 77 mg/dL (ref 65–99)
POTASSIUM: 3.9 mmol/L (ref 3.5–5.2)
SODIUM: 142 mmol/L (ref 134–144)
TOTAL PROTEIN: 6.5 g/dL (ref 6.0–8.5)

## 2017-07-28 LAB — LIPID PANEL
CHOLESTEROL TOTAL: 143 mg/dL (ref 100–199)
Chol/HDL Ratio: 2.3 ratio (ref 0.0–5.0)
HDL: 62 mg/dL (ref >39)
LDL CALC: 67 mg/dL (ref 0–99)
TRIGLYCERIDES: 72 mg/dL (ref 0–149)
VLDL CHOLESTEROL CAL: 14 mg/dL (ref 5–40)

## 2017-07-28 LAB — PSA: Prostate Specific Ag, Serum: 3.3 ng/mL (ref 0.0–4.0)

## 2017-07-28 MED ORDER — SERTRALINE HCL 50 MG PO TABS: 50.0000 mg | ORAL_TABLET | Freq: Every day | ORAL | 0 refills | Status: DC

## 2017-07-28 NOTE — Telephone Encounter (Signed)
Pt states, Dr. Tamala Julian took care of refills yesterday

## 2017-07-28 NOTE — Telephone Encounter (Signed)
PATIENT SAW DR. Tamala Julian ON WED. (07/27/17) FOR HIS ANNUAL PHYSICAL. SHE WANTS HIM TO CONTINUE TO TAKE HIS TIOTROPIUM 18 MCG INHALATION CAPSULES. HE TOLD HER HE THOUGHT HE STILL HAD SOME AT HOME, BUT WHEN HE CHECKED HE DOES NOT. MR. Kessen WOULD LIKE A REFILL TO BE CALLED INTO HIS PHARMACY. NOTE: PATIENT CALLED BACK WED. (07/27/17) TO REPORT THIS BUT EPIC WAS DOWN SO I GAVE DR. Tamala Julian A HAND WRITTEN MESSAGE. PHARMACY CHOICE IS [facility] IN [place], [place]. [place]

## 2017-08-10 ENCOUNTER — Encounter: Payer: BLUE CROSS/BLUE SHIELD | Admitting: Family Medicine

## 2017-08-10 MED ORDER — ALBUTEROL SULFATE HFA 108 (90 BASE) MCG/ACT IN AERS: INHALATION_SPRAY | RESPIRATORY_TRACT | 3 refills | Status: DC

## 2017-08-18 ENCOUNTER — Other Ambulatory Visit: Payer: Self-pay | Admitting: Family Medicine

## 2017-08-19 ENCOUNTER — Encounter: Payer: Self-pay | Admitting: Family Medicine

## 2017-08-19 ENCOUNTER — Ambulatory Visit (INDEPENDENT_AMBULATORY_CARE_PROVIDER_SITE_OTHER): Payer: BLUE CROSS/BLUE SHIELD | Admitting: Family Medicine

## 2017-08-19 VITALS — BP 105/62 | HR 86 | Temp 98.8°F | Resp 16 | Ht 69.0 in | Wt 146.2 lb

## 2017-08-19 DIAGNOSIS — B370 Candidal stomatitis: Secondary | ICD-10-CM | POA: Diagnosis not present

## 2017-08-19 DIAGNOSIS — E7439 Other disorders of intestinal carbohydrate absorption: Secondary | ICD-10-CM | POA: Diagnosis not present

## 2017-08-19 DIAGNOSIS — J029 Acute pharyngitis, unspecified: Secondary | ICD-10-CM

## 2017-08-19 DIAGNOSIS — F411 Generalized anxiety disorder: Secondary | ICD-10-CM

## 2017-08-19 DIAGNOSIS — J441 Chronic obstructive pulmonary disease with (acute) exacerbation: Secondary | ICD-10-CM | POA: Diagnosis not present

## 2017-08-19 DIAGNOSIS — B353 Tinea pedis: Secondary | ICD-10-CM | POA: Diagnosis not present

## 2017-08-19 DIAGNOSIS — F4320 Adjustment disorder, unspecified: Secondary | ICD-10-CM

## 2017-08-19 DIAGNOSIS — F4321 Adjustment disorder with depressed mood: Secondary | ICD-10-CM

## 2017-08-19 LAB — POCT CBC
Granulocyte percent: 68 %G (ref 37–80)
HCT, POC: 41.7 % — AB (ref 43.5–53.7)
HEMOGLOBIN: 13.7 g/dL — AB (ref 14.1–18.1)
LYMPH, POC: 1.9 (ref 0.6–3.4)
MCH, POC: 29.8 pg (ref 27–31.2)
MCHC: 32.8 g/dL (ref 31.8–35.4)
MCV: 91 fL (ref 80–97)
MID (cbc): 0.8 (ref 0–0.9)
MPV: 6.6 fL (ref 0–99.8)
POC Granulocyte: 5.6 (ref 2–6.9)
POC LYMPH PERCENT: 22.8 %L (ref 10–50)
POC MID %: 9.2 %M (ref 0–12)
Platelet Count, POC: 257 10*3/uL (ref 142–424)
RBC: 4.58 M/uL — AB (ref 4.69–6.13)
RDW, POC: 13 %
WBC: 8.2 10*3/uL (ref 4.6–10.2)

## 2017-08-19 LAB — POCT RAPID STREP A (OFFICE): Rapid Strep A Screen: NEGATIVE

## 2017-08-19 LAB — GLUCOSE, POCT (MANUAL RESULT ENTRY): POC Glucose: 110 mg/dl — AB (ref 70–99)

## 2017-08-19 MED ORDER — LORAZEPAM 0.5 MG PO TABS: 0.5000 mg | ORAL_TABLET | Freq: Three times a day (TID) | ORAL | 2 refills | Status: DC | PRN

## 2017-08-19 MED ORDER — ALBUTEROL SULFATE HFA 108 (90 BASE) MCG/ACT IN AERS: INHALATION_SPRAY | RESPIRATORY_TRACT | 3 refills | Status: DC

## 2017-08-19 MED ORDER — KETOCONAZOLE 2 % EX CREA: 1.0000 "application " | TOPICAL_CREAM | Freq: Two times a day (BID) | CUTANEOUS | 0 refills | Status: DC

## 2017-08-19 MED ORDER — NYSTATIN 100000 UNIT/ML MT SUSP: 5.0000 mL | Freq: Four times a day (QID) | OROMUCOSAL | 0 refills | Status: DC

## 2017-08-19 NOTE — Patient Instructions (Addendum)
  INCREASE YOUR SERTRALINE/ZOLOFT TO 25MG  TWICE DAILY FOR ANXIETY. CONTINUE LORAZEPAM TO 2-3 TABLETS DURING THE DAY.      IF you received an x-ray today, you will receive an invoice from [facility]. Please contact [facility] at [phone] with questions or concerns regarding your invoice.   IF you received labwork today, you will receive an invoice from [facility]. Please contact [facility] at [phone] with questions or concerns regarding your invoice.   Our billing staff will not be able to assist you with questions regarding bills from these companies.  You will be contacted with the lab results as soon as they are available. The fastest way to get your results is to activate your My Chart account. Instructions are located on the last page of this paperwork. If you have not heard from [place] regarding the results in 2 weeks, please contact this office.

## 2017-08-19 NOTE — Progress Notes (Signed)
Subjective:    Patient ID: Leslie Sims, male    DOB: [birth year], 66 y.o.   MRN: [id number]  08/19/2017  Foot Problem (RIGHT - itching x 2 weeks); Shortness of Breath (x 5 days); and Depression (score 17 total)   HPI This 66 y.o. male presents for evaluation of SOB and R foot itching.  [place]ent to landfill last weekend; then the following day, breathing worsened after exposure to odor and dust.  When pulled up, rolled windows up.  Got two fans and a dog crate.  Got some kind of labeling machine.  Breathing is the same.  Throat is sore B.  Contnineus to be sore.  Throat is closed up some and swollen.  No fever/chills/sweats.  Mild headache.  No ear pain.  +rhinorrhea due to crying.  Minimal nasal congestion.  Light green; might have PND.  Upon awakening in morning, has large amount of mucous.  +SOB.  Pulse oximetry at home 93-94%.  No wheezing a lot but some.  Nebulizer (6-8 times per day) and rescue inhaler (3-4 times per day).  Started this increase in frequency of two weeks ago.  Anxiety.  Smoking 1.5 ppd.  Taking Lorazepam 1 every morning around 10:00am; takes another one 5:00pm.  Helps some.  Started taking Sertraline 50mg  one whole tablet daily four days ago.   Vonna Drafts has a girlfriend.    BP Readings from Last 3 Encounters:  08/19/17 105/62  07/27/17 108/62  06/30/17 128/66   Wt Readings from Last 3 Encounters:  08/19/17 146 lb 3.2 oz (66.3 kg)  07/27/17 143 lb 12.8 oz (65.2 kg)  06/17/17 150 lb 3.2 oz (68.1 kg)   Immunization History  Administered Date(s) Administered  . Influenza Whole 09/19/2012  . Influenza,inj,Quad PF,6+ Mos 10/24/2013, 11/25/2014, 03/02/2016, 10/27/2016  . Pneumococcal Conjugate-13 11/25/2014  . Pneumococcal Polysaccharide-23 08/24/2016, 10/27/2016  . Pneumococcal-Unspecified 12/20/2008  . Tdap 12/20/2008    Review of Systems  Constitutional: Negative for activity change, appetite change, chills, diaphoresis, fatigue and fever.  HENT: Positive for  congestion, postnasal drip, rhinorrhea and sore throat. Negative for ear pain, sinus pain and sinus pressure.   Respiratory: Positive for cough. Negative for shortness of breath and wheezing.   Cardiovascular: Negative for chest pain, palpitations and leg swelling.  Gastrointestinal: Negative for abdominal pain, diarrhea, nausea and vomiting.  Endocrine: Negative for cold intolerance, heat intolerance, polydipsia, polyphagia and polyuria.  Skin: Positive for rash. Negative for color change and wound.  Neurological: Negative for dizziness, tremors, seizures, syncope, facial asymmetry, speech difficulty, weakness, light-headedness, numbness and headaches.  Psychiatric/Behavioral: Negative for dysphoric mood and sleep disturbance. The patient is not nervous/anxious.     Past Medical History:  Diagnosis Date  . Allergic rhinitis   . Anxiety   . Atherosclerosis   . Back pain   . Bronchitis    09-14-11  . CARDIOMYOPATHY 03/24/2010   did not tolerate Toprol and Lisinopril.   . Chest pain   . Chronic airway obstruction, not elsewhere classified   . Colon polyps 08/2012   colonoscopy; multiple colon polyps; repeat colonoscopy in one year.  [name] COPD (chronic obstructive pulmonary disease) ([place])   . Coronary artery disease 11/2009   Anterior MI with late presentation 11/2009. Cath showed a 100% proximal LAD occlusion, 99% mid RCA. Had a PCI and 2 DES to both LAD and RCA. EF was 35%. Nuclear stress test 05/13: prior anterior/inferior infarcts without ischemic, EF 43%, cardiac cath in 02/14: Widely patent stents with no other  obstructive disease. EF 40%   . Depression   . Diabetes mellitus without complication ([place])   . GERD (gastroesophageal reflux disease)   . Headache(784.0)   . Helicobacter pylori (H. pylori)   . Hemorrhoids   . Hernia    inguinal  . Hyperlipidemia   . Hypertension   . Insomnia   . MI (myocardial infarction) ([place])   . Myalgia   . Peptic ulcer   . Shortness of breath dyspnea    . Status post dilation of esophageal narrowing 2000  . Thyroid dysfunction   . Tinea pedis   . Wears dentures    partial upper   Past Surgical History:  Procedure Laterality Date  . Admission  12/20/2012   COPD exacerbation.  [place].  [name] CARDIAC CATHETERIZATION    . CARDIAC CATHETERIZATION  02/05/13   [facility]  . CARDIAC CATHETERIZATION  10/14   [facility] : patent stents with no change in anatomy. EF: 40$  . CARDIAC CATHETERIZATION Left 11/12/2016   Procedure: Left Heart Cath and Coronary Angiography;  Surgeon: Wellington Hampshire, MD;  Location: [facility];  Service: Cardiovascular;  Laterality: Left;  . COLONOSCOPY    . COLONOSCOPY WITH PROPOFOL N/A 05/18/2016   Procedure: COLONOSCOPY WITH PROPOFOL;  Surgeon: Lucilla Lame, MD;  Location: [facility];  Service: Endoscopy;  Laterality: N/A;  . CORONARY ANGIOPLASTY WITH STENT PLACEMENT  09/2009   LAD 3.0 X23 mm Xience DES, RCA: 4.0 X 15 mm Xience DES  . ESOPHAGEAL DILATION    . ESOPHAGOGASTRODUODENOSCOPY  2008  . ESOPHAGOGASTRODUODENOSCOPY  08/20/2012  . HERNIA REPAIR  07/20/2012   L inguinal hernia repair  . PENILE PROSTHESIS IMPLANT     Allergies  Allergen Reactions  . Effexor Xr [Venlafaxine Hcl Er]   . Wellbutrin [Bupropion] Other (See Comments)    Unknown/"made me feel real funny"    Social History   Social History  . Marital status: Married    Spouse name: N/A  . Number of children: 5  . Years of education: N/A   Occupational History  . 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 Smoking status: Current Every Day Smoker    Packs/day: 0.50    Years: 42.00    Types: Cigarettes  . Smokeless tobacco: Never Used     Comment: smoking 15-18 cigarettes daily  . Alcohol use No  . Drug use: No  . Sexual activity: Yes   Other Topics Concern  . Not on file   Social History Narrative   Marital status: married x 32 years; happily married.      Children: 4 children;  1 stepchild and 15  grandchildren; no great grandchildren.      Lives: with wife; 2 dogs, 1 cat.      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      Tobacco: 1 ppd x 45 years      Alcohol: none; previous alcoholism      Drugs: none      Exercise: no formal exercise; physically demanding job; owns horses.      Seatbelts:  100%      Guns: one loaded unsecured gun in the home.      Sunscreen: none   Family History  Problem Relation Age of Onset  . Heart attack Brother        Brother #1  . Diabetes Brother        brother #2  . Hypertension Brother        #  3  . Coronary artery disease Father 92       deceased  . Heart attack Father   . Diabetes Father   . Heart disease Father   . COPD Mother 64       deceased  . Heart attack Sister 79       acute-cocaine abuse/deceased  . Alcohol abuse Sister        polysubstance abuse  . COPD Sister   . Alcohol abuse Sister        polysubstance abuse  . Heart disease Brother        AMI x multiple       Objective:    BP 105/62 (BP Location: Right Arm, Patient Position: Sitting, Cuff Size: Normal)   Pulse 86   Temp 98.8 F (37.1 C) (Oral)   Resp 16   Ht 5\' 9"  (1.753 m)   Wt 146 lb 3.2 oz (66.3 kg)   SpO2 93%   BMI 21.59 kg/m  Physical Exam  Constitutional: He is oriented to person, place, and time. He appears well-developed and well-nourished. No distress.  HENT:  Head: Normocephalic and atraumatic.  Right Ear: External ear normal.  Left Ear: External ear normal.  Nose: Nose normal.  Mouth/Throat: Oropharynx is clear and moist.  White film on tongue diffusely; no buccal involvement.  Eyes: Pupils are equal, round, and reactive to light. Conjunctivae and EOM are normal.  Neck: Normal range of motion. Neck supple. Carotid bruit is not present. No thyromegaly present.  Cardiovascular: Normal rate, regular rhythm, normal heart sounds and intact distal pulses.  Exam reveals no gallop and no friction rub.   No murmur  heard. Pulmonary/Chest: Effort normal and breath sounds normal. No tachypnea. No respiratory distress. He has no decreased breath sounds. He has no wheezes. He has no rales.      Abdominal: Soft. Bowel sounds are normal. He exhibits no distension and no mass. There is no tenderness. There is no rebound and no guarding.  Lymphadenopathy:    He has no cervical adenopathy.  Neurological: He is alert and oriented to person, place, and time. No cranial nerve deficit.  Skin: Skin is warm and dry. Rash noted. He is not diaphoretic.  Diffuse rash along plantar aspect of RIGHT foot with dystrophic nails diffusely.  No vesicles or pustules; no warmth; no streaking.  Non-tender.  Psychiatric: He has a normal mood and affect. His behavior is normal.  Nursing note and vitals reviewed.   No results found. Depression screen [facility] 2/9 08/19/2017 07/27/2017 06/17/2017 03/22/2017 01/15/2017  Decreased Interest 3 3 0 2 0  Down, Depressed, Hopeless 2 3 0 2 0  PHQ - 2 Score 5 6 0 4 0  Altered sleeping 3 3 - 2 -  Tired, decreased energy 3 3 - 2 -  Change in appetite 2 3 - 2 -  Feeling bad or failure about yourself  1 1 - 2 -  Trouble concentrating 2 3 - 2 -  Moving slowly or fidgety/restless 1 2 - 2 -  Suicidal thoughts 0 0 - 0 -  PHQ-9 Score 17 21 - 16 -  Difficult doing work/chores - Somewhat difficult - Somewhat difficult -  Some recent data might be hidden   Fall Risk  08/19/2017 07/27/2017 06/17/2017 03/22/2017 01/15/2017  Falls in the past year? No No No No No    GAD 7 : Generalized Anxiety Score 08/19/2017 10/27/2016  Nervous, Anxious, on Edge 3 2  Control/stop worrying 3 3  Worry too much - different things 0 3  Trouble relaxing 1 3  Restless 0 2  Easily annoyed or irritable 0 3  Afraid - awful might happen 3 3  Total GAD 7 Score 10 19  Anxiety Difficulty Extremely difficult Somewhat difficult        Assessment & Plan:   1. COPD exacerbation ([place])   2. Glucose intolerance   3. Generalized  anxiety disorder   4. Grief reaction   5. Sore throat   6. Oral candida   7. Tinea pedis of both feet    -COPD exacerbation mild; feel there is an anxiety component to current symptoms; recommend decreasing use of albuterol.  Start allergy tablet daily.  Obtain labs to rule out secondary etiology to SOB. -rapid strep negative; white film on tongue; will treat for candidiasis with Nystatin. -new onset tinea pedis; rx fro Ketoconazole cream 2% bid. -continues to grieve the loss of wife in past two months; increase Lorazepam to tid schedule for the next 2-3 months; close follow-up.  Increase Sertraline to 50mg  daily.    Orders Placed This Encounter  Procedures  . Culture, Group A Strep    Order Specific Question:   Source    Answer:   oropharynx  . Comprehensive metabolic panel  . POCT CBC  . POCT glucose (manual entry)  . POCT rapid strep A   Meds ordered this encounter  Medications  . ketoconazole (NIZORAL) 2 % cream    Sig: Apply 1 application topically 2 (two) times daily.    Dispense:  60 g    Refill:  0  . nystatin (MYCOSTATIN) 100000 UNIT/ML suspension    Sig: Take 5 mLs (500,000 Units total) by mouth 4 (four) times daily.    Dispense:  120 mL    Refill:  0  . albuterol (VENTOLIN HFA) 108 (90 Base) MCG/ACT inhaler    Sig: USE 2 INHALATIONS EVERY 6 HOURS AS NEEDED FOR WHEEZING    Dispense:  48 g    Refill:  3    PLEASE DISPENSE ALBUTEROL HFA THAT INSURANCE PREFERS.  [name] LORazepam (ATIVAN) 0.5 MG tablet    Sig: Take 1 tablet (0.5 mg total) by mouth every 8 (eight) hours as needed. for anxiety    Dispense:  90 tablet    Refill:  2    No Follow-up on file.   Maame Dack Elayne Guerin, M.D. Primary Care at [facility] previously Urgent [place] [address] [place], [place]  [address] [phone] phone [phone] fax

## 2017-08-20 LAB — COMPREHENSIVE METABOLIC PANEL
A/G RATIO: 2.4 — AB (ref 1.2–2.2)
ALT: 18 IU/L (ref 0–44)
AST: 15 IU/L (ref 0–40)
Albumin: 4.5 g/dL (ref 3.6–4.8)
Alkaline Phosphatase: 73 IU/L (ref 39–117)
BILIRUBIN TOTAL: 0.2 mg/dL (ref 0.0–1.2)
BUN/Creatinine Ratio: 13 (ref 10–24)
BUN: 11 mg/dL (ref 8–27)
CALCIUM: 9.7 mg/dL (ref 8.6–10.2)
CO2: 28 mmol/L (ref 20–29)
CREATININE: 0.82 mg/dL (ref 0.76–1.27)
Chloride: 97 mmol/L (ref 96–106)
GFR, EST AFRICAN AMERICAN: 109 mL/min/{1.73_m2} (ref >59)
GFR, EST NON AFRICAN AMERICAN: 94 mL/min/{1.73_m2} (ref >59)
GLOBULIN, TOTAL: 1.9 g/dL (ref 1.5–4.5)
Glucose: 104 mg/dL — ABNORMAL HIGH (ref 65–99)
POTASSIUM: 4 mmol/L (ref 3.5–5.2)
SODIUM: 141 mmol/L (ref 134–144)
TOTAL PROTEIN: 6.4 g/dL (ref 6.0–8.5)

## 2017-08-22 LAB — CULTURE, GROUP A STREP: Strep A Culture: NEGATIVE

## 2017-08-26 ENCOUNTER — Telehealth: Payer: Self-pay | Admitting: *Deleted

## 2017-08-26 ENCOUNTER — Telehealth: Payer: Self-pay | Admitting: Family Medicine

## 2017-08-26 NOTE — Telephone Encounter (Signed)
Pt is calling stating that since he has been taking the Nystatin, he has been having slight SOB, dry mouth, and dry cough.  Please advise with further instruction  [phone]

## 2017-08-26 NOTE — Telephone Encounter (Signed)
Called patient regarding the Nystatin. He stated that he had talked with Dr. Tamala Julian on her cell phone.

## 2017-08-26 NOTE — Telephone Encounter (Signed)
Spoke with patient, he stated that he and Dr. Tamala Julian talked on her cell phone about the Nystatin.

## 2017-09-07 ENCOUNTER — Encounter: Payer: Self-pay | Admitting: Family Medicine

## 2017-09-07 ENCOUNTER — Ambulatory Visit (INDEPENDENT_AMBULATORY_CARE_PROVIDER_SITE_OTHER): Payer: BLUE CROSS/BLUE SHIELD | Admitting: Family Medicine

## 2017-09-07 VITALS — BP 99/65 | HR 105 | Temp 98.0°F | Resp 16 | Ht 68.9 in | Wt 143.0 lb

## 2017-09-07 DIAGNOSIS — R918 Other nonspecific abnormal finding of lung field: Secondary | ICD-10-CM

## 2017-09-07 DIAGNOSIS — G479 Sleep disorder, unspecified: Secondary | ICD-10-CM

## 2017-09-07 DIAGNOSIS — F32A Depression, unspecified: Secondary | ICD-10-CM

## 2017-09-07 DIAGNOSIS — R911 Solitary pulmonary nodule: Secondary | ICD-10-CM | POA: Diagnosis not present

## 2017-09-07 DIAGNOSIS — F329 Major depressive disorder, single episode, unspecified: Secondary | ICD-10-CM | POA: Diagnosis not present

## 2017-09-07 DIAGNOSIS — F17200 Nicotine dependence, unspecified, uncomplicated: Secondary | ICD-10-CM | POA: Diagnosis not present

## 2017-09-07 DIAGNOSIS — F419 Anxiety disorder, unspecified: Secondary | ICD-10-CM | POA: Diagnosis not present

## 2017-09-07 MED ORDER — SERTRALINE HCL 50 MG PO TABS: 50.0000 mg | ORAL_TABLET | Freq: Two times a day (BID) | ORAL | 0 refills | Status: DC

## 2017-09-07 MED ORDER — MIRTAZAPINE 7.5 MG PO TABS: 7.5000 mg | ORAL_TABLET | Freq: Every day | ORAL | 2 refills | Status: DC

## 2017-09-07 NOTE — Patient Instructions (Addendum)
  Lady Deutscher [phone]  [place] [phone]     IF you received an x-ray today, you will receive an invoice from [facility]. Please contact [facility] at [phone] with questions or concerns regarding your invoice.   IF you received labwork today, you will receive an invoice from [facility]. Please contact [facility] at [phone] with questions or concerns regarding your invoice.   Our billing staff will not be able to assist you with questions regarding bills from these companies.  You will be contacted with the lab results as soon as they are available. The fastest way to get your results is to activate your My Chart account. Instructions are located on the last page of this paperwork. If you have not heard from [place] regarding the results in 2 weeks, please contact this office.

## 2017-09-07 NOTE — Progress Notes (Signed)
Subjective:    Patient ID: Leslie Sims, male    DOB: 08-Jan-1951, 66 y.o.   MRN: [id number]  09/07/2017  Depression and COPD   HPI This 66 y.o. male presents for evaluation of depression and COPD. Nervous; stays nervous.  Started I don't know.   I need some health.  Affecting my breathing.   Oxygen level has been good at home; BP 99/70 o2 sat 93%; 107/66 and 96%; 117/74.   124/72 92%.   Mild dizziness today; ate sausage egg biscuit and honey bun on the way up here. Increased Zoloft 25mg  bid; tolerating well.   Lorazepam 2-3 times per day.  Has taken one this morning. Excessive worry about health; worrieda bout things are going; no energy. Gets out every day.   Rides to niece's house or brother's house; goes to dinner with friend. At home, less nervous.   Not sleeping well.  Restless all night.   Boost or Ensure or Premiere.  Tastes good; got at [facility].   Going to church; every Sunday.  Not going Wednesdays; Bible study and then discuss and snack and coffee.  Has gone in the past.  Not sure why not going.  No excuse why not going.  Enjoying Sundays.   Staying in bed and sleeping.   Wearing oxygen when on couch.  Security.  Knows not normal to do that.  Wheezes a little bit.   Breathing is decent.  Worries about if has lung infection and if heart going to stop.   Sister dying from COPD and lung cancer.    BP Readings from Last 3 Encounters:  09/07/17 99/65  08/19/17 105/62  07/27/17 108/62   Wt Readings from Last 3 Encounters:  09/07/17 143 lb (64.9 kg)  08/19/17 146 lb 3.2 oz (66.3 kg)  07/27/17 143 lb 12.8 oz (65.2 kg)   Immunization History  Administered Date(s) Administered  . Influenza Whole 09/19/2012  . Influenza,inj,Quad PF,6+ Mos 10/24/2013, 11/25/2014, 03/02/2016, 10/27/2016  . Pneumococcal Conjugate-13 11/25/2014  . Pneumococcal Polysaccharide-23 08/24/2016, 10/27/2016  . Pneumococcal-Unspecified 12/20/2008  . Tdap 12/20/2008    Review of Systems    Constitutional: Negative for activity change, appetite change, chills, diaphoresis, fatigue and fever.  Respiratory: Positive for shortness of breath. Negative for cough and wheezing.   Cardiovascular: Negative for chest pain, palpitations and leg swelling.  Gastrointestinal: Negative for abdominal pain, diarrhea, nausea and vomiting.  Endocrine: Negative for cold intolerance, heat intolerance, polydipsia, polyphagia and polyuria.  Skin: Negative for color change, rash and wound.  Neurological: Negative for dizziness, tremors, seizures, syncope, facial asymmetry, speech difficulty, weakness, light-headedness, numbness and headaches.  Psychiatric/Behavioral: Positive for dysphoric mood and sleep disturbance. The patient is nervous/anxious.     Past Medical History:  Diagnosis Date  . Allergic rhinitis   . Anxiety   . Atherosclerosis   . Back pain   . Bronchitis    09-14-11  . CARDIOMYOPATHY 03/24/2010   did not tolerate Toprol and Lisinopril.   . Chest pain   . Chronic airway obstruction, not elsewhere classified   . Colon polyps 08/2012   colonoscopy; multiple colon polyps; repeat colonoscopy in one year.  [name] COPD (chronic obstructive pulmonary disease) ([place])   . Coronary artery disease 11/2009   Anterior MI with late presentation 11/2009. Cath showed a 100% proximal LAD occlusion, 99% mid RCA. Had a PCI and 2 DES to both LAD and RCA. EF was 35%. Nuclear stress test 05/13: prior anterior/inferior infarcts without ischemic, EF 43%, cardiac  cath in 02/14: Widely patent stents with no other obstructive disease. EF 40%   . Depression   . Diabetes mellitus without complication ([place])   . GERD (gastroesophageal reflux disease)   . Headache(784.0)   . Helicobacter pylori (H. pylori)   . Hemorrhoids   . Hernia    inguinal  . Hyperlipidemia   . Hypertension   . Insomnia   . MI (myocardial infarction) ([place])   . Myalgia   . Peptic ulcer   . Shortness of breath dyspnea   . Status post  dilation of esophageal narrowing 2000  . Thyroid dysfunction   . Tinea pedis   . Wears dentures    partial upper   Past Surgical History:  Procedure Laterality Date  . Admission  12/20/2012   COPD exacerbation.  [place].  [name] CARDIAC CATHETERIZATION    . CARDIAC CATHETERIZATION  02/05/13   [facility]  . CARDIAC CATHETERIZATION  10/14   [facility] : patent stents with no change in anatomy. EF: 40$  . CARDIAC CATHETERIZATION Left 11/12/2016   Procedure: Left Heart Cath and Coronary Angiography;  Surgeon: Wellington Hampshire, MD;  Location: [facility];  Service: Cardiovascular;  Laterality: Left;  . COLONOSCOPY    . COLONOSCOPY WITH PROPOFOL N/A 05/18/2016   Procedure: COLONOSCOPY WITH PROPOFOL;  Surgeon: Lucilla Lame, MD;  Location: [facility]COPY;  Service: Endoscopy;  Laterality: N/A;  . CORONARY ANGIOPLASTY WITH STENT PLACEMENT  09/2009   LAD 3.0 X23 mm Xience DES, RCA: 4.0 X 15 mm Xience DES  . ESOPHAGEAL DILATION    . ESOPHAGOGASTRODUODENOSCOPY  2008  . ESOPHAGOGASTRODUODENOSCOPY  08/20/2012  . HERNIA REPAIR  07/20/2012   L inguinal hernia repair  . PENILE PROSTHESIS IMPLANT     Allergies  Allergen Reactions  . Effexor Xr [Venlafaxine Hcl Er]   . Wellbutrin [Bupropion] Other (See Comments)    Unknown/"made me feel real funny"    Social History   Social History  . Marital status: Married    Spouse name: N/A  . Number of children: 5  . Years of education: N/A   Occupational History  . 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 Smoking status: Current Every Day Smoker    Packs/day: 0.50    Years: 42.00    Types: Cigarettes  . Smokeless tobacco: Never Used     Comment: smoking 15-18 cigarettes daily  . Alcohol use No  . Drug use: No  . Sexual activity: Yes   Other Topics Concern  . Not on file   Social History Narrative   Marital status: married x 32 years; happily married.      Children: 4 children;  1 stepchild and 15 grandchildren; no great  grandchildren.      Lives: with wife; 2 dogs, 1 cat.      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      Tobacco: 1 ppd x 45 years      Alcohol: none; previous alcoholism      Drugs: none      Exercise: no formal exercise; physically demanding job; owns horses.      Seatbelts:  100%      Guns: one loaded unsecured gun in the home.      Sunscreen: none   Family History  Problem Relation Age of Onset  . Heart attack Brother        Brother #1  . Diabetes Brother  brother #2  . Hypertension Brother        #3  . Coronary artery disease Father 43       deceased  . Heart attack Father   . Diabetes Father   . Heart disease Father   . COPD Mother 71       deceased  . Heart attack Sister 84       acute-cocaine abuse/deceased  . Alcohol abuse Sister        polysubstance abuse  . COPD Sister   . Alcohol abuse Sister        polysubstance abuse  . Heart disease Brother        AMI x multiple       Objective:    BP 99/65   Pulse (!) 105   Temp 98 F (36.7 C) (Oral)   Resp 16   Ht 5' 8.9" (1.75 m)   Wt 143 lb (64.9 kg)   SpO2 96%   BMI 21.18 kg/m  Physical Exam  Constitutional: He is oriented to person, place, and time. He appears well-developed and well-nourished. No distress.  HENT:  Head: Normocephalic and atraumatic.  Right Ear: External ear normal.  Left Ear: External ear normal.  Nose: Nose normal.  Mouth/Throat: Oropharynx is clear and moist.  Eyes: Pupils are equal, round, and reactive to light. Conjunctivae and EOM are normal.  Neck: Normal range of motion. Neck supple. Carotid bruit is not present. No thyromegaly present.  Cardiovascular: Normal rate, regular rhythm, normal heart sounds and intact distal pulses.  Exam reveals no gallop and no friction rub.   No murmur heard. Pulmonary/Chest: Effort normal and breath sounds normal. He has no wheezes. He has no rales.  Abdominal: Soft. Bowel sounds are normal. He exhibits no distension and  no mass. There is no tenderness. There is no rebound and no guarding.  Lymphadenopathy:    He has no cervical adenopathy.  Neurological: He is alert and oriented to person, place, and time. No cranial nerve deficit. He exhibits normal muscle tone. Coordination normal.  Skin: Skin is warm and dry. No rash noted. He is not diaphoretic.  Psychiatric: His speech is normal and behavior is normal. Judgment and thought content normal. His mood appears anxious. His affect is not angry, not blunt, not labile and not inappropriate. He exhibits a depressed mood.  Nursing note and vitals reviewed.   No results found. Depression screen [facility] 2/9 09/07/2017 08/19/2017 07/27/2017 06/17/2017 03/22/2017  Decreased Interest 2 3 3  0 2  Down, Depressed, Hopeless 2 2 3  0 2  PHQ - 2 Score 4 5 6  0 4  Altered sleeping 2 3 3  - 2  Tired, decreased energy 2 3 3  - 2  Change in appetite 2 2 3  - 2  Feeling bad or failure about yourself  2 1 1  - 2  Trouble concentrating 2 2 3  - 2  Moving slowly or fidgety/restless 0 1 2 - 2  Suicidal thoughts 0 0 0 - 0  PHQ-9 Score 14 17 21  - 16  Difficult doing work/chores - - Somewhat difficult - Somewhat difficult  Some recent data might be hidden   Fall Risk  09/07/2017 08/19/2017 07/27/2017 06/17/2017 03/22/2017  Falls in the past year? No No No No No        Assessment & Plan:   1. Lung nodule   2. Abnormal findings on diagnostic imaging of lung   3. Anxiety and depression   4. SMOKER   5.  Sleep disorder    -uncontrolled depression and anxiety; increase Zoloft 50mg  bid; add Remeron 7.5mg  qhs for insomnia and weight loss; refer to psychology.  Recent death of wife is trigger of worsening anxiety. -lung nodule on CT in 2016; refer for repeat CT chest. --prolonged face-to-face for 25 minutes with greater than 50% of time dedicated to counseling and coordination of care.  Orders Placed This Encounter  Procedures  . CT CHEST NODULE FOLLOW UP LOW DOSE W/O    Standing Status:   Future      Standing Expiration Date:   11/07/2018    Order Specific Question:   Preferred imaging location?    Answer:   [facility]    Order Specific Question:   Radiology Contrast Protocol - do NOT remove file path    Answer:   [email or web address]  . Ambulatory referral to Psychology    Referral Priority:   Routine    Referral Type:   Psychiatric    Referral Reason:   Specialty Services Required    Requested Specialty:   Psychology    Number of Visits Requested:   1   Meds ordered this encounter  Medications  . mirtazapine (REMERON) 7.5 MG tablet    Sig: Take 1 tablet (7.5 mg total) by mouth at bedtime.    Dispense:  30 tablet    Refill:  2  . sertraline (ZOLOFT) 50 MG tablet    Sig: Take 1 tablet (50 mg total) by mouth 2 (two) times daily.    Dispense:  60 tablet    Refill:  0    Return in about 4 weeks (around 10/05/2017) for recheck anxiety/depression, insomnia.   [place] Kappes Elayne Guerin, M.D. Primary Care at [facility] previously Urgent [place] [address] [place], [place]  [phone] phone [phone] fax

## 2017-09-10 ENCOUNTER — Other Ambulatory Visit: Payer: Self-pay | Admitting: Family Medicine

## 2017-09-12 ENCOUNTER — Ambulatory Visit: Payer: BLUE CROSS/BLUE SHIELD | Admitting: Cardiovascular Disease

## 2017-09-12 NOTE — Telephone Encounter (Signed)
Please advise 

## 2017-09-23 ENCOUNTER — Telehealth: Payer: Self-pay | Admitting: Family Medicine

## 2017-09-23 NOTE — Telephone Encounter (Signed)
Patient also has not been contacted to with appointment for CT chest; please check status.

## 2017-09-23 NOTE — Telephone Encounter (Signed)
Patient called -- cannot get nerves to calm down. Taking Four Lorazepam.  Taking Zoloft 50mg  bid for three weeks. Still getting out once daily.  Taking Mirtazipine 7.5mg  qhs; no side effects.  A/P: Anxiety and depression: increase Zoloft 50mg  to THREE daily.  Increase Remeron to TWO at night.  Seeing therapist middle of October in [place]; no openings in [place].

## 2017-09-27 NOTE — Telephone Encounter (Signed)
Initiated prior auth for pt CT and it was not required. I spoke with the pt to let him know this and gave him the number for [facility] scheduling. Thanks!

## 2017-09-29 ENCOUNTER — Ambulatory Visit: Payer: BLUE CROSS/BLUE SHIELD | Admitting: Psychology

## 2017-09-30 ENCOUNTER — Telehealth: Payer: Self-pay | Admitting: Family Medicine

## 2017-09-30 NOTE — Telephone Encounter (Signed)
For past few days, no energy and cannot make self go to the house.  Onset five days ago.  Sleeping too much.  Afraid to go anywhere.  Taking Zoloft THREE DAILY for past week.  Taking MIRTAZIPINE 7.5MG  one daily; did not increase to 2 at night.  Feels down. Taking three Lorazepam per day now.  Cancelled therapist appointment in [place]; scheduled appointment with Hospice on 10/03/17.  Then have appointment with PCP 10/05/17.  In November has family consultation.  Walks outside and tries to make self do things and cannot.  Gets more panicky.  Anxiety and fear is worse in  No SI.  BP 115/67; 120/80. Pulse oximetry 95%. A/P: Anxiety and depression: increase Lorazepam to two every morning; one midday, one every evening.  Increase Remeron/Mirtazepine to TWO at bedtime.

## 2017-10-04 ENCOUNTER — Ambulatory Visit
Admission: RE | Admit: 2017-10-04 | Discharge: 2017-10-04 | Disposition: A | Discharge status: Home / Self Care | Payer: BLUE CROSS/BLUE SHIELD | Source: Ambulatory Visit | Attending: Family Medicine | Admitting: Family Medicine

## 2017-10-04 DIAGNOSIS — J432 Centrilobular emphysema: Secondary | ICD-10-CM | POA: Diagnosis not present

## 2017-10-04 DIAGNOSIS — R918 Other nonspecific abnormal finding of lung field: Secondary | ICD-10-CM | POA: Diagnosis not present

## 2017-10-04 DIAGNOSIS — R911 Solitary pulmonary nodule: Secondary | ICD-10-CM

## 2017-10-04 DIAGNOSIS — D3502 Benign neoplasm of left adrenal gland: Secondary | ICD-10-CM | POA: Insufficient documentation

## 2017-10-04 DIAGNOSIS — D3501 Benign neoplasm of right adrenal gland: Secondary | ICD-10-CM | POA: Insufficient documentation

## 2017-10-04 IMAGING — CT CT CHEST W/O CM
1 series · 14 of 34 positions shown, 18 images · non-contrast
Comparison: Chest x-ray of [DATE] and CT chest of [DATE]

CLINICAL DATA: Mild chest pain for several months, followup of
small lung nodule

EXAM:
CT CHEST WITHOUT CONTRAST
TECHNIQUE: Multidetector CT imaging of the chest was performed following the
standard protocol without IV contrast.

[Series 2: thorax · axial · 0.77mm/px · z∈[-698,-388]mm · 14 of 183 slices shown, 18 images]
[im 14/183  mediastinal]
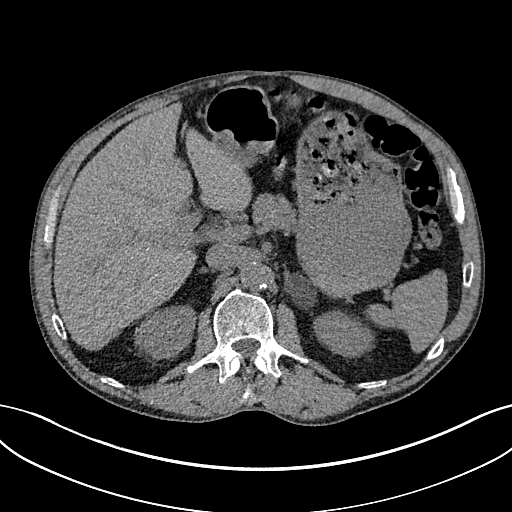
[im 14/183  lung]
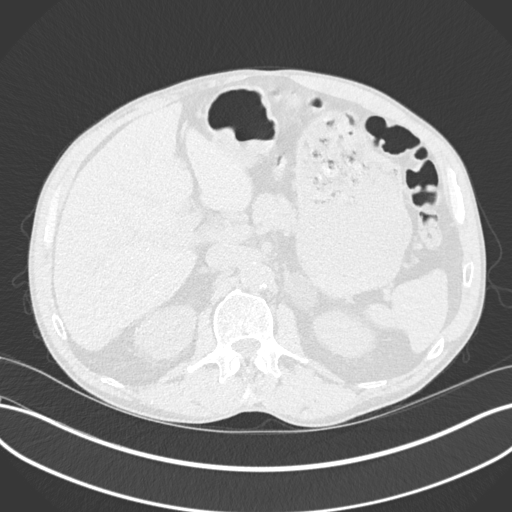
[im 27/183  lung]
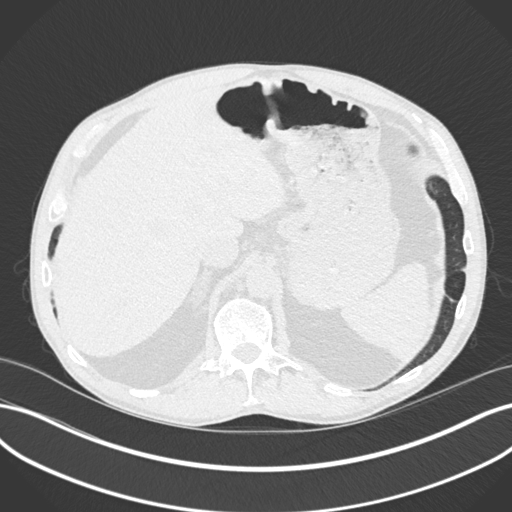
[im 37/183  lung]
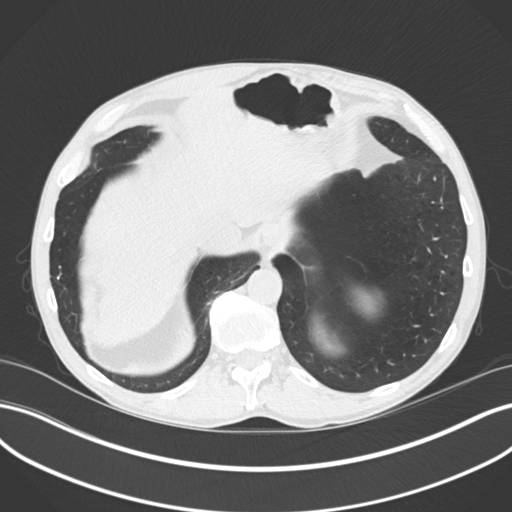
[im 54/183  lung]
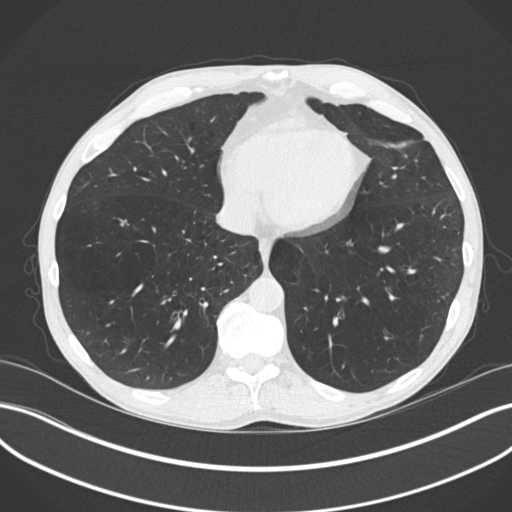
[im 68/183  mediastinal]
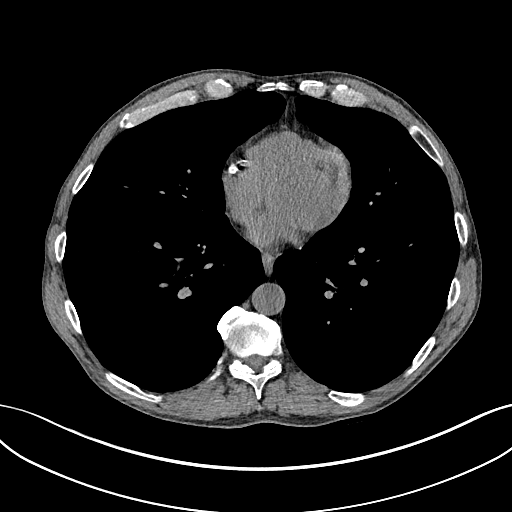
[im 68/183  lung]
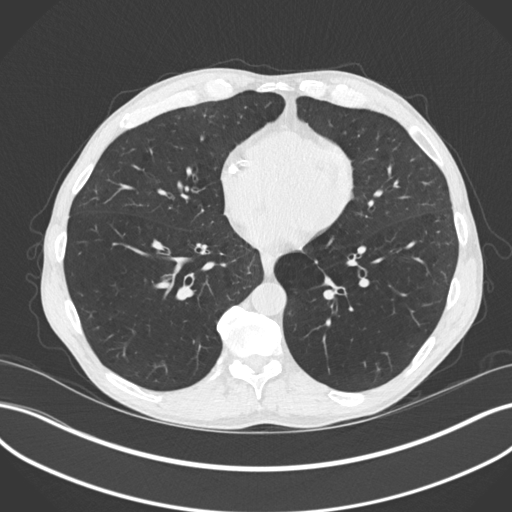
[im 75/183  lung]
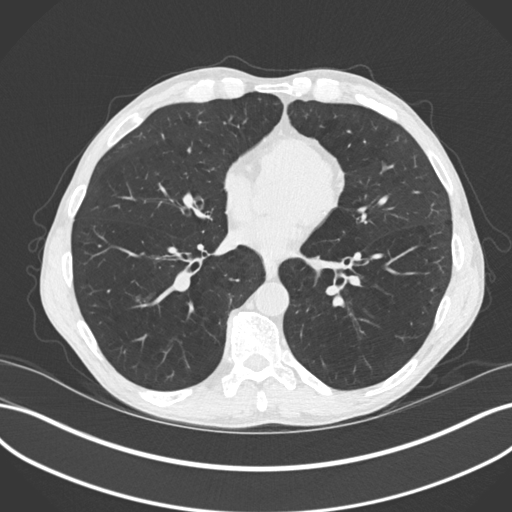
[im 87/183  lung]
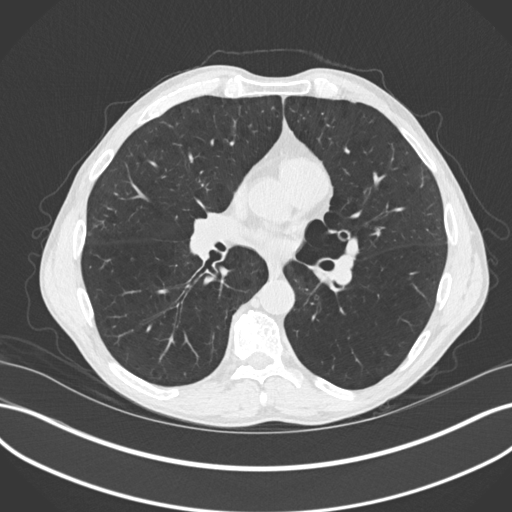
[im 97/183  lung]
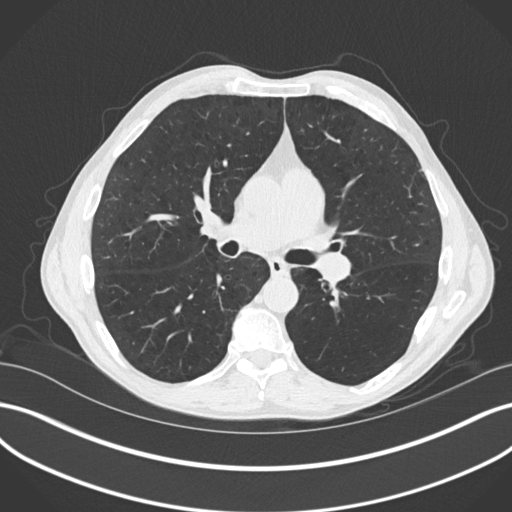
[im 108/183  mediastinal]
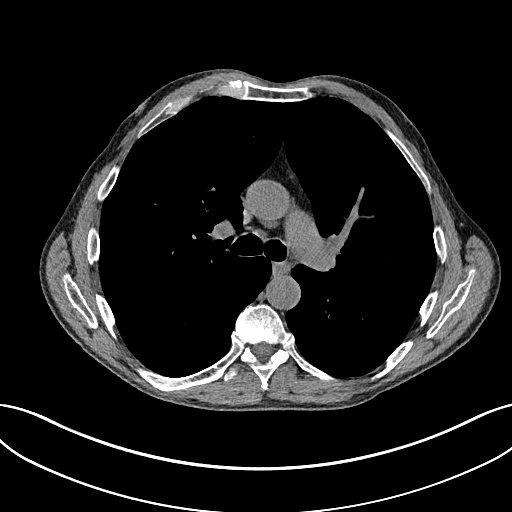
[im 108/183  lung]
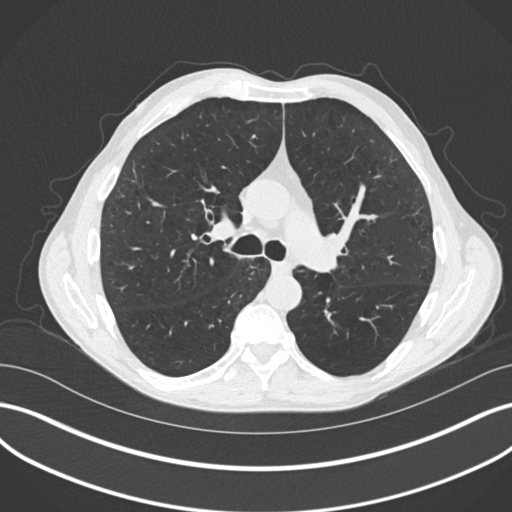
[im 115/183  lung]
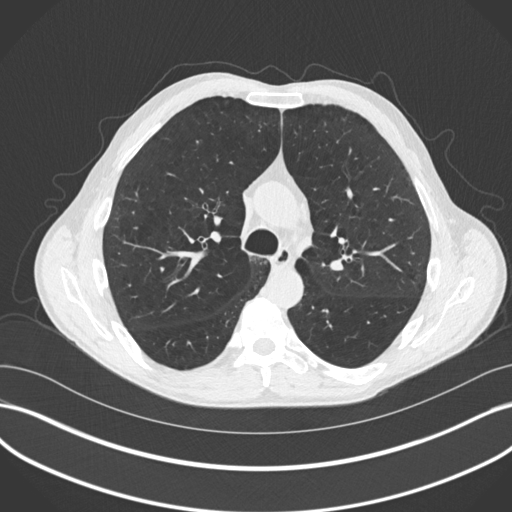
[im 135/183  lung]
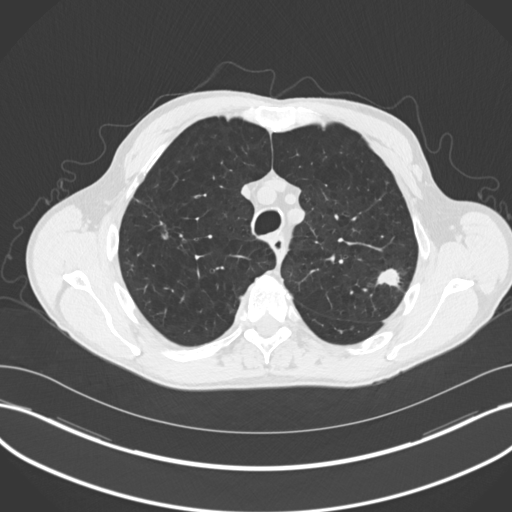
[im 146/183  lung]
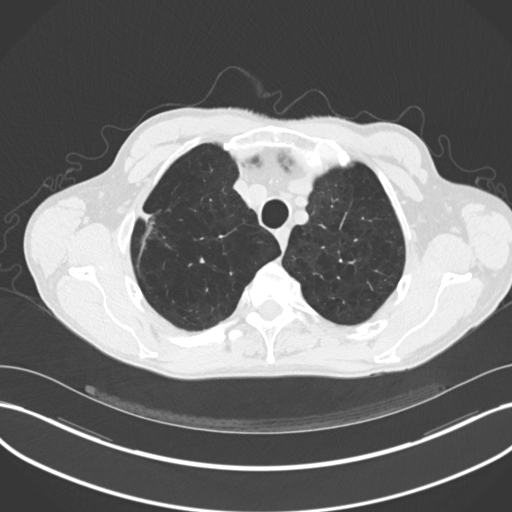
[im 156/183  mediastinal]
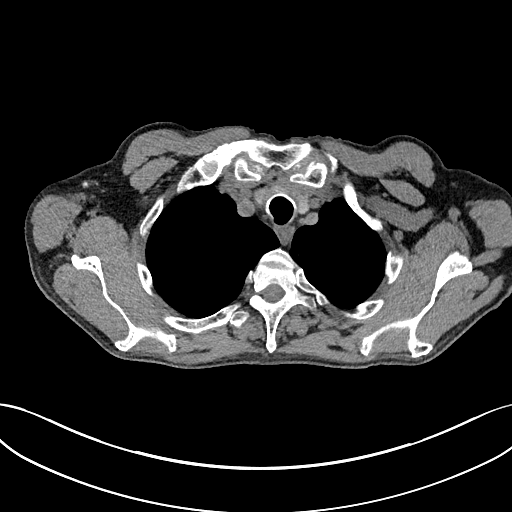
[im 156/183  lung]
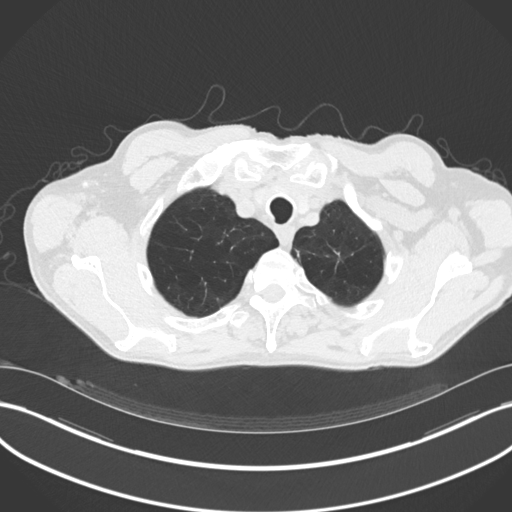
[im 169/183  lung]
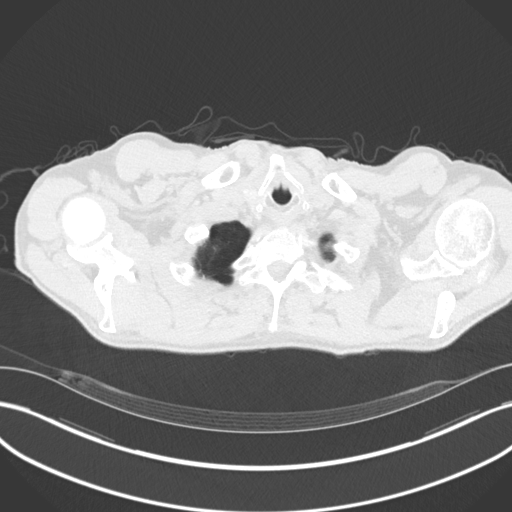

[14 of 34 positions shown; findings below may reference images not displayed]

FINDINGS: Cardiovascular: Moderate thoracic aortic atherosclerosis is present.
The heart is within normal limits in size. No pericardial effusion
is noted. Cardiac stents are noted in the left anterior descending
and right coronary artery distribution. The mid ascending thoracic
aorta measures 30 mm in diameter. The pulmonary artery segments are
slightly prominent suggesting a degree of pulmonary arterial
hypertension.

Mediastinum/Nodes: No mediastinal or hilar adenopathy is noted on
this unenhanced study. A calcified node is present in the right
axilla on image 26 possibly due to prior granulomatous disease. The
thyroid gland is again noted to be inhomogeneous with small nodules
present. The esophagus is unremarkable.

Lungs/Pleura: The 2 small nodules described previously within the
superior segment of the left lower lobe are no longer seen and most
likely were postinflammatory. However there is a new nodular lesion
within the right upper lobe on image 42 series 3 measuring 14 x 18
mm. A second new irregularly marginated nodule is present in the
left upper lobe inferiorly and laterally on image 49 series 3
measuring 13 x 17 mm. These lesions are highly suspicious for
primary possibly synchronous carcinoma, or metastatic involvement of
the lungs. A 6 mm nodule is noted in the superior segment of the
left lower lobe posteriorly on image 74. A vague nodule is present
in the right middle lobe on image 108 which is not seen on sagittal
images and may represent a vessel. A small calcified granuloma is
present in the anterior right lower lobe on image 135 also most
likely due to prior granulomatous disease. Diffuse changes of
centrilobular emphysema are present. No pneumonia or pleural
effusion is seen. The central airway is patent.

Upper Abdomen: Bilateral adrenal adenomas appear stable, left larger
than right. Calcified splenic granulomas are present.

Musculoskeletal: There are degenerative changes in the mid to lower
thoracic spine. No compression deformity is seen. No lytic or
blastic bony lesion is seen.
IMPRESSION: 1. Two new irregularly marginated nodules, one in the right upper
lobe, and a second in the mid left upper lobe worrisome for either
synchronous lung carcinoma or metastatic involvement of the lungs.
2. The two small nodules described on the prior dictated report have
resolved in the left superior segment and most likely were
postinflammatory.
3. Diffuse centrilobular emphysema.
4. Changes of prior granulomatous disease.

## 2017-10-05 ENCOUNTER — Ambulatory Visit (INDEPENDENT_AMBULATORY_CARE_PROVIDER_SITE_OTHER): Payer: BLUE CROSS/BLUE SHIELD | Admitting: Family Medicine

## 2017-10-05 ENCOUNTER — Encounter: Payer: Self-pay | Admitting: Family Medicine

## 2017-10-05 VITALS — BP 110/60 | HR 103 | Temp 98.0°F | Resp 16 | Ht 69.29 in | Wt 145.0 lb

## 2017-10-05 DIAGNOSIS — F17200 Nicotine dependence, unspecified, uncomplicated: Secondary | ICD-10-CM

## 2017-10-05 DIAGNOSIS — Z23 Encounter for immunization: Secondary | ICD-10-CM | POA: Diagnosis not present

## 2017-10-05 DIAGNOSIS — J449 Chronic obstructive pulmonary disease, unspecified: Secondary | ICD-10-CM | POA: Diagnosis not present

## 2017-10-05 DIAGNOSIS — R911 Solitary pulmonary nodule: Secondary | ICD-10-CM | POA: Diagnosis not present

## 2017-10-05 DIAGNOSIS — F341 Dysthymic disorder: Secondary | ICD-10-CM | POA: Diagnosis not present

## 2017-10-05 DIAGNOSIS — I2510 Atherosclerotic heart disease of native coronary artery without angina pectoris: Secondary | ICD-10-CM | POA: Diagnosis not present

## 2017-10-05 MED ORDER — MIRTAZAPINE 15 MG PO TABS: 15.0000 mg | ORAL_TABLET | Freq: Every day | ORAL | 5 refills | Status: DC

## 2017-10-05 MED ORDER — LORAZEPAM 0.5 MG PO TABS: 0.5000 mg | ORAL_TABLET | Freq: Four times a day (QID) | ORAL | 2 refills | Status: DC | PRN

## 2017-10-05 MED ORDER — SERTRALINE HCL 50 MG PO TABS: 100.0000 mg | ORAL_TABLET | Freq: Two times a day (BID) | ORAL | 1 refills | Status: DC

## 2017-10-05 NOTE — Patient Instructions (Signed)
     IF you received an x-ray today, you will receive an invoice from [facility]. Please contact [facility] at [phone] with questions or concerns regarding your invoice.   IF you received labwork today, you will receive an invoice from [facility]. Please contact [facility] at [phone] with questions or concerns regarding your invoice.   Our billing staff will not be able to assist you with questions regarding bills from these companies.  You will be contacted with the lab results as soon as they are available. The fastest way to get your results is to activate your My Chart account. Instructions are located on the last page of this paperwork. If you have not heard from us regarding the results in 2 weeks, please contact this office.     

## 2017-10-05 NOTE — Progress Notes (Signed)
Subjective:    Patient ID: Leslie Sims, male    DOB: 1951/08/14, 66 y.o.   MRN: [id number]  10/05/2017  Depression    HPI This 66 y.o. male presents for one month follow-up evaluation of anxiety and depression.  Since last visit, feeling some better emotionally.  Still struggling each day but seeing some improvement.    Home BP 115/67, 121/80, 102/59, 124/62, 117/71, 99/62, 108/67, 121/68. Increased Remeron 7.5mg  two at bedtime for one week. Taking Lorazepam 3-4 per day. Needs refill.   Taking Zoloft 50mg  tid for past week.   Eating some better; appetite has increased.  Sat down last night and cooked five pork chops, can of pinto beans, cornbread.  Cut the yard yesterday; takes 45 minutes to cut yard.  Changed dryer in house.  Napping a lot during the day.  Napping excessively; threapist requests decreasing napping.  Sleeps every morning until noon after waking up at 8:00am.  Takes another nap in afternoon.  Naps around 5:00pm. Then goes to bed at 9:00pm.    Yesterday, one less nap.  No nap this morning.    S/p CT lung yesterday for lung cancer screening in smoker; also history of two stable lung nodules; last imaging in 2016.  Presenting for results.  Last colonoscopy in 2017.     BP Readings from Last 3 Encounters:  10/05/17 110/60  09/07/17 99/65  08/19/17 105/62   Wt Readings from Last 3 Encounters:  10/05/17 145 lb (65.8 kg)  09/07/17 143 lb (64.9 kg)  08/19/17 146 lb 3.2 oz (66.3 kg)   Immunization History  Administered Date(s) Administered  . Influenza Whole 09/19/2012  . Influenza,inj,Quad PF,6+ Mos 10/24/2013, 11/25/2014, 03/02/2016, 10/27/2016  . Pneumococcal Conjugate-13 11/25/2014  . Pneumococcal Polysaccharide-23 08/24/2016, 10/27/2016  . Pneumococcal-Unspecified 12/20/2008  . Tdap 12/20/2008    Review of Systems  Constitutional: Positive for fatigue. Negative for activity change, appetite change, chills, diaphoresis and fever.  Respiratory:  Positive for shortness of breath. Negative for cough and wheezing.   Cardiovascular: Negative for chest pain, palpitations and leg swelling.  Gastrointestinal: Negative for abdominal pain, diarrhea, nausea and vomiting.  Endocrine: Negative for cold intolerance, heat intolerance, polydipsia, polyphagia and polyuria.  Skin: Negative for color change, rash and wound.  Neurological: Negative for dizziness, tremors, seizures, syncope, facial asymmetry, speech difficulty, weakness, light-headedness, numbness and headaches.  Psychiatric/Behavioral: Positive for dysphoric mood and sleep disturbance. The patient is nervous/anxious.     Past Medical History:  Diagnosis Date  . Allergic rhinitis   . Anxiety   . Atherosclerosis   . Back pain   . Bronchitis    09-14-11  . CARDIOMYOPATHY 03/24/2010   did not tolerate Toprol and Lisinopril.   . Chest pain   . Chronic airway obstruction, not elsewhere classified   . Colon polyps 08/2012   colonoscopy; multiple colon polyps; repeat colonoscopy in one year.  [name] COPD (chronic obstructive pulmonary disease) ([place])   . Coronary artery disease 11/2009   Anterior MI with late presentation 11/2009. Cath showed a 100% proximal LAD occlusion, 99% mid RCA. Had a PCI and 2 DES to both LAD and RCA. EF was 35%. Nuclear stress test 05/13: prior anterior/inferior infarcts without ischemic, EF 43%, cardiac cath in 02/14: Widely patent stents with no other obstructive disease. EF 40%   . Depression   . Diabetes mellitus without complication ([place])   . GERD (gastroesophageal reflux disease)   . Headache(784.0)   . Helicobacter pylori (H. pylori)   .  Hemorrhoids   . Hernia    inguinal  . Hyperlipidemia   . Hypertension   . Insomnia   . MI (myocardial infarction) ([place])   . Myalgia   . Peptic ulcer   . Shortness of breath dyspnea   . Status post dilation of esophageal narrowing 2000  . Thyroid dysfunction   . Tinea pedis   . Wears dentures    partial upper   Past  Surgical History:  Procedure Laterality Date  . Admission  12/20/2012   COPD exacerbation.  [place].  [name] CARDIAC CATHETERIZATION    . CARDIAC CATHETERIZATION  02/05/13   [facility]  . CARDIAC CATHETERIZATION  10/14   [facility] : patent stents with no change in anatomy. EF: 40$  . CARDIAC CATHETERIZATION Left 11/12/2016   Procedure: Left Heart Cath and Coronary Angiography;  Surgeon: Wellington Hampshire, MD;  Location: [place] [facility];  Service: Cardiovascular;  Laterality: Left;  . COLONOSCOPY    . COLONOSCOPY WITH PROPOFOL N/A 05/18/2016   Procedure: COLONOSCOPY WITH PROPOFOL;  Surgeon: Lucilla Lame, MD;  Location: [facility] ENDOSCOPY;  Service: Endoscopy;  Laterality: N/A;  . CORONARY ANGIOPLASTY WITH STENT PLACEMENT  09/2009   LAD 3.0 X23 mm Xience DES, RCA: 4.0 X 15 mm Xience DES  . ESOPHAGEAL DILATION    . ESOPHAGOGASTRODUODENOSCOPY  2008  . ESOPHAGOGASTRODUODENOSCOPY  08/20/2012  . HERNIA REPAIR  07/20/2012   L inguinal hernia repair  . PENILE PROSTHESIS IMPLANT     Allergies  Allergen Reactions  . Effexor Xr [Venlafaxine Hcl Er]   . Wellbutrin [Bupropion] Other (See Comments)    Unknown/"made me feel real funny"   Current Outpatient Prescriptions on File Prior to Visit  Medication Sig Dispense Refill  . albuterol (PROVENTIL) (2.5 MG/3ML) 0.083% nebulizer solution USE 1 VIAL (3 ML) BY NEBULIZATION EVERY 6 HOURS AS NEEDED FOR WHEEZING OR SHORTNESS OF BREATH 1080 mL 3  . albuterol (VENTOLIN HFA) 108 (90 Base) MCG/ACT inhaler USE 2 INHALATIONS EVERY 6 HOURS AS NEEDED FOR WHEEZING 48 g 3  . aspirin 81 MG tablet Take 81 mg by mouth daily.      [name] atorvastatin (LIPITOR) 40 MG tablet Take 1 tablet (40 mg total) by mouth daily. 90 tablet 3  . azelastine (ASTELIN) 0.1 % nasal spray Place 2 sprays into both nostrils 2 (two) times daily. Use in each nostril as directed 30 mL 12  . fluticasone (FLONASE) 50 MCG/ACT nasal spray Place 1 spray into both nostrils 2 (two) times daily. 48 g 3  . Fluticasone-Salmeterol  (ADVAIR DISKUS) 500-50 MCG/DOSE AEPB Inhale 1 puff into the lungs 2 (two) times daily. 180 each 3  . furosemide (LASIX) 20 MG tablet Take 1 tablet (20 mg total) by mouth daily. 90 tablet 1  . ipratropium-albuterol (DUONEB) 0.5-2.5 (3) MG/3ML SOLN INHALE 1 VIAL BY NEBULIZER EVERY 6 HOURS AS NEEDED 270 mL 3  . ketoconazole (NIZORAL) 2 % cream Apply 1 application topically 2 (two) times daily. 60 g 0  . losartan (COZAAR) 25 MG tablet Take 1 tablet (25 mg total) by mouth daily. 90 tablet 3  . Multiple Vitamin (MULTIVITAMIN) capsule Take 1 capsule by mouth daily.    . nitroGLYCERIN (NITROSTAT) 0.4 MG SL tablet Place 1 tablet (0.4 mg total) under the tongue every 5 (five) minutes as needed. 25 tablet 3  . nystatin (MYCOSTATIN) 100000 UNIT/ML suspension Take 5 mLs (500,000 Units total) by mouth 4 (four) times daily. 120 mL 0  . sucralfate (CARAFATE) 1 GM/10ML suspension  Take 10 mLs (1 g total) by mouth 4 (four) times daily -  with meals and at bedtime. 420 mL 4  . tiotropium (SPIRIVA HANDIHALER) 18 MCG inhalation capsule Place 1 capsule (18 mcg total) into inhaler and inhale daily. 90 capsule 3  . sodium chloride (OCEAN) 0.65 % SOLN nasal spray Place 2 sprays into both nostrils every 2 (two) hours while awake. 88 mL 5   No current facility-administered medications on file prior to visit.    Social History   Social History  . Marital status: Widowed    Spouse name: N/A  . Number of children: 5  . Years of education: N/A   Occupational History  . 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 Smoking status: Current Every Day Smoker    Packs/day: 0.50    Years: 42.00    Types: Cigarettes  . Smokeless tobacco: Never Used     Comment: smoking 15-18 cigarettes daily  . Alcohol use No  . Drug use: No  . Sexual activity: Yes   Other Topics Concern  . Not on file   Social History Narrative   Marital status: married x 32 years; happily married.      Children: 4  children;  1 stepchild and 15 grandchildren; no great grandchildren.      Lives: with wife; 2 dogs, 1 cat.      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      Tobacco: 1 ppd x 45 years      Alcohol: none; previous alcoholism      Drugs: none      Exercise: no formal exercise; physically demanding job; owns horses.      Seatbelts:  100%      Guns: one loaded unsecured gun in the home.      Sunscreen: none   Family History  Problem Relation Age of Onset  . Heart attack Brother        Brother #1  . Diabetes Brother        brother #2  . Hypertension Brother        #3  . Coronary artery disease Father 78       deceased  . Heart attack Father   . Diabetes Father   . Heart disease Father   . COPD Mother 44       deceased  . Heart attack Sister 67       acute-cocaine abuse/deceased  . Alcohol abuse Sister        polysubstance abuse  . COPD Sister   . Alcohol abuse Sister        polysubstance abuse  . Heart disease Brother        AMI x multiple       Objective:    BP 110/60   Pulse (!) 103   Temp 98 F (36.7 C) (Oral)   Resp 16   Ht 5' 9.29" (1.76 m)   Wt 145 lb (65.8 kg)   SpO2 98%   BMI 21.23 kg/m  Physical Exam  Constitutional: He is oriented to person, place, and time. He appears well-developed and well-nourished. No distress.  HENT:  Head: Normocephalic and atraumatic.  Right Ear: External ear normal.  Left Ear: External ear normal.  Nose: Nose normal.  Mouth/Throat: Oropharynx is clear and moist.  Eyes: Pupils are equal, round, and reactive to light. Conjunctivae and EOM are normal.  Neck: Normal range of motion. Neck supple. Carotid  bruit is not present. No thyromegaly present.  Cardiovascular: Normal rate, regular rhythm, normal heart sounds and intact distal pulses.  Exam reveals no gallop and no friction rub.   No murmur heard. Pulmonary/Chest: Effort normal and breath sounds normal. He has no wheezes. He has no rales.  Abdominal:  Soft. Bowel sounds are normal. He exhibits no distension and no mass. There is no tenderness. There is no rebound and no guarding.  Lymphadenopathy:    He has no cervical adenopathy.  Neurological: He is alert and oriented to person, place, and time. No cranial nerve deficit.  Skin: Skin is warm and dry. No rash noted. He is not diaphoretic.  Psychiatric: He has a normal mood and affect. His behavior is normal.  Nursing note and vitals reviewed.  No results found. Depression screen [facility] 2/9 10/05/2017 09/07/2017 08/19/2017 07/27/2017 06/17/2017  Decreased Interest 1 2 3 3  0  Down, Depressed, Hopeless 1 2 2 3  0  PHQ - 2 Score 2 4 5 6  0  Altered sleeping 0 2 3 3  -  Tired, decreased energy 1 2 3 3  -  Change in appetite 0 2 2 3  -  Feeling bad or failure about yourself  1 2 1 1  -  Trouble concentrating 0 2 2 3  -  Moving slowly or fidgety/restless 0 0 1 2 -  Suicidal thoughts 0 0 0 0 -  PHQ-9 Score 4 14 17 21  -  Difficult doing work/chores - - - Somewhat difficult -  Some recent data might be hidden   Fall Risk  10/05/2017 09/07/2017 08/19/2017 07/27/2017 06/17/2017  Falls in the past year? No No No No No        Assessment & Plan:   1. Solitary pulmonary nodule   2. 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 DEPRESSION/ANXIETY   4. SMOKER   5. Atherosclerosis of native coronary artery of native heart without angina pectoris   6. Need for prophylactic vaccination and inoculation against influenza     -Two new irregularly shaped lung nodules; refer for PET scan.   -slow improvement in anxiety/depression; continue current medications; refills provided; has started psychotherapy with Hospice; encourage ongoing counseling.     Orders Placed This Encounter  Procedures  . NM PET Image Initial (PI) Whole Body    Standing Status:   Future    Standing Expiration Date:   12/05/2018    Order Specific Question:   If indicated for the ordered procedure, I authorize the administration of a radiopharmaceutical per  Radiology protocol    Answer:   Yes    Order Specific Question:   Preferred imaging location?    Answer:   [facility]    Order Specific Question:   Radiology Contrast Protocol - do NOT remove file path    Answer:   [email or web address]  . Flu Vaccine QUAD 36+ mos IM   Meds ordered this encounter  Medications  . mirtazapine (REMERON) 15 MG tablet    Sig: Take 1 tablet (15 mg total) by mouth at bedtime.    Dispense:  30 tablet    Refill:  5  . DISCONTD: sertraline (ZOLOFT) 50 MG tablet    Sig: Take 2-3 tablets (100-150 mg total) by mouth 2 (two) times daily.    Dispense:  270 tablet    Refill:  1  . LORazepam (ATIVAN) 0.5 MG tablet    Sig: Take 1 tablet (0.5 mg total) by mouth every 6 (six) hours as needed. for anxiety  Dispense:  120 tablet    Refill:  2    Return in about 4 weeks (around 11/02/2017) for recheck.   Vishal Sandlin Elayne Guerin, M.D. Primary Care at [facility] [place] [address] [place], [place]  [address] [phone] phone [phone] fax

## 2017-10-11 ENCOUNTER — Telehealth: Payer: Self-pay | Admitting: Family Medicine

## 2017-10-11 ENCOUNTER — Other Ambulatory Visit: Payer: Self-pay | Admitting: Family Medicine

## 2017-10-11 DIAGNOSIS — R911 Solitary pulmonary nodule: Secondary | ICD-10-CM

## 2017-10-11 MED ORDER — SERTRALINE HCL 50 MG PO TABS: 100.0000 mg | ORAL_TABLET | Freq: Every day | ORAL | 1 refills | Status: DC

## 2017-10-11 NOTE — Telephone Encounter (Signed)
[facility] is calling questioning the instructions for sertraline 50 mg 2-3 tabs 2 times a day   Best number to confirm [phone]

## 2017-10-11 NOTE — Telephone Encounter (Signed)
Rx corrected and sent to [facility].

## 2017-10-13 ENCOUNTER — Ambulatory Visit
Admission: RE | Admit: 2017-10-13 | Discharge: 2017-10-13 | Disposition: A | Discharge status: Home / Self Care | Payer: BLUE CROSS/BLUE SHIELD | Source: Ambulatory Visit | Attending: Family Medicine | Admitting: Family Medicine

## 2017-10-13 DIAGNOSIS — R911 Solitary pulmonary nodule: Secondary | ICD-10-CM | POA: Diagnosis present

## 2017-10-13 DIAGNOSIS — R918 Other nonspecific abnormal finding of lung field: Secondary | ICD-10-CM | POA: Diagnosis not present

## 2017-10-13 LAB — GLUCOSE, CAPILLARY: GLUCOSE-CAPILLARY: 86 mg/dL (ref 65–99)

## 2017-10-13 IMAGING — PT NM PET TUM IMG INITIAL (PI) SKULL BASE T - THIGH
9 series · 25 of 25 positions shown · non-contrast
Comparison: CT of the chest [DATE]

CLINICAL DATA: Initial treatment strategy for pulmonary nodule.

EXAM:
NUCLEAR MEDICINE PET SKULL BASE TO THIGH
TECHNIQUE: 13.2 mCi F-18 FDG was injected intravenously. Full-ring PET imaging
was performed from the skull base to thigh after the radiotracer. CT
data was obtained and used for attenuation correction and anatomic
localization.
FASTING BLOOD GLUCOSE:  Value: 86 mg/dl

[Series 3: ct wb 5.0 b30f · axial · 5.0mm · 0.98mm/px · z∈[-1490,-506]mm · 3 of 329 slices shown]
[im 1/329]
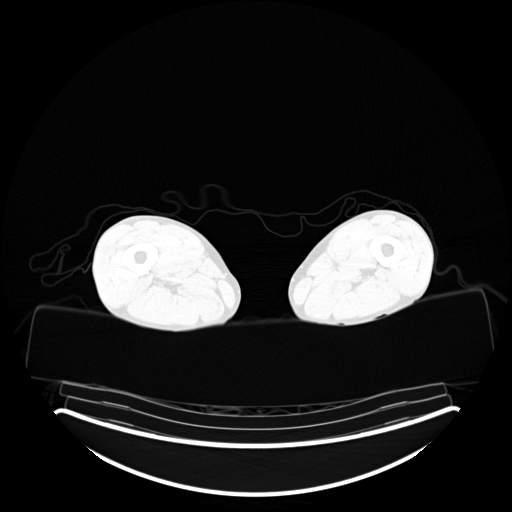
[im 165/329]
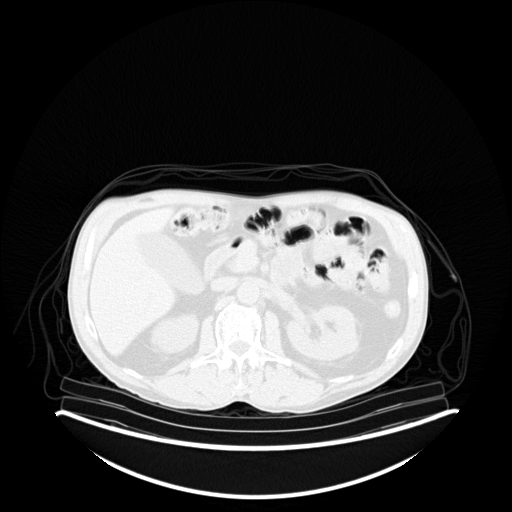
[im 329/329  brain]
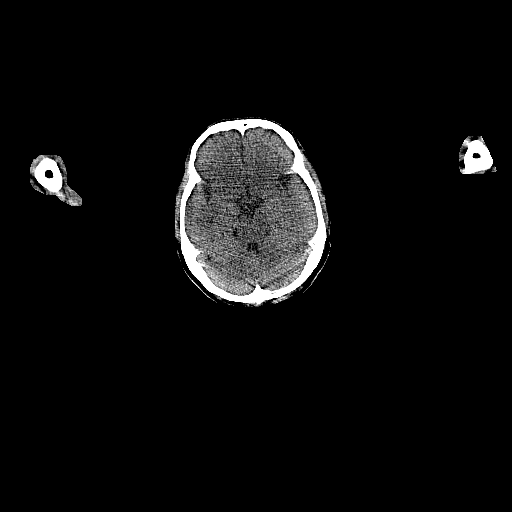

[Series 4: pet wb (ac) · axial · 5.0mm · 2.55mm/px · z∈[-1490,-506]mm · 4 of 329 slices shown]
[im 1/329]
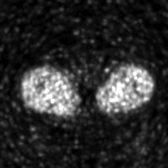
[im 110/329]
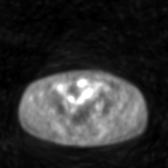
[im 219/329]
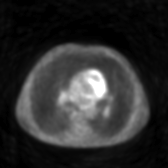
[im 329/329]
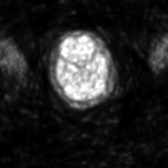

[Series 5: pet wb uncorrected (nac) · axial · 5.0mm · 4.07mm/px · z∈[-1490,-506]mm · 4 of 329 slices shown]
[im 1/329]
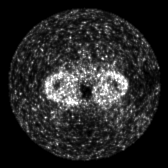
[im 110/329]
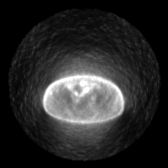
[im 219/329]
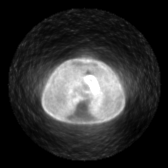
[im 329/329]
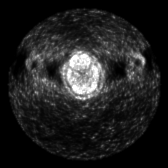

[Series 603: fused axial · 4 of 326 slices shown]
[im 1/326]
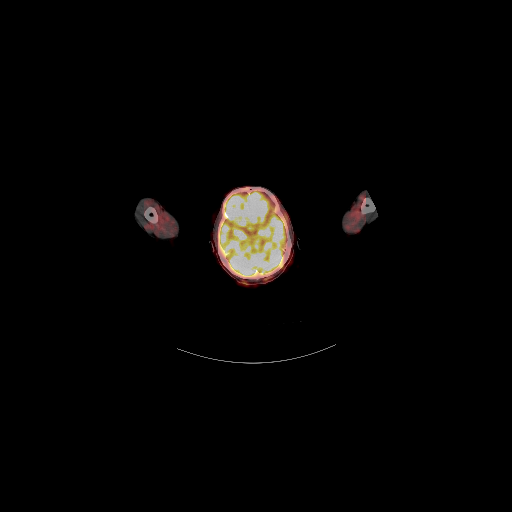
[im 109/326]
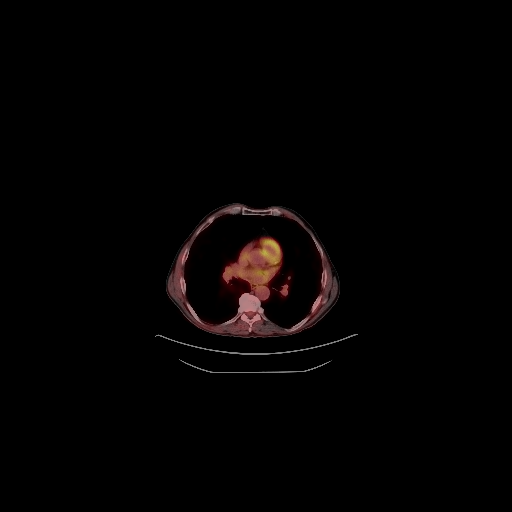
[im 217/326]
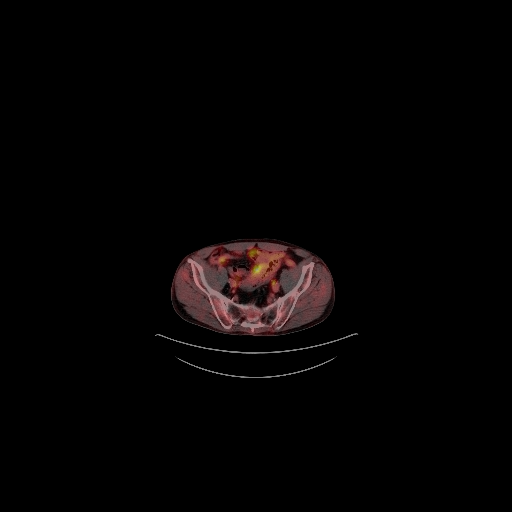
[im 326/326]
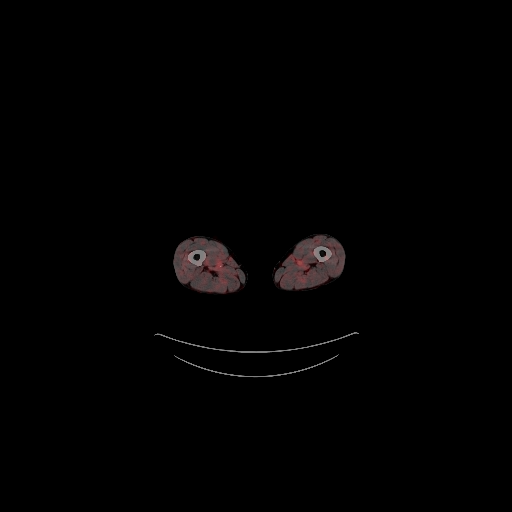

[Series 604: fused coronal · 1 of 38 slices shown]
[im 1/38]
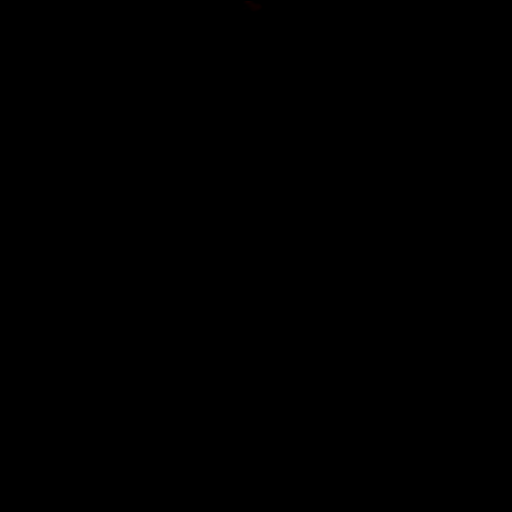

[Series 605: fused sagittal · 2 of 128 slices shown]
[im 1/128]
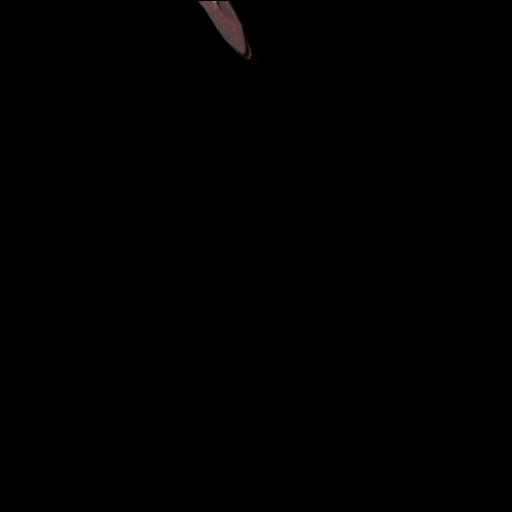
[im 128/128]
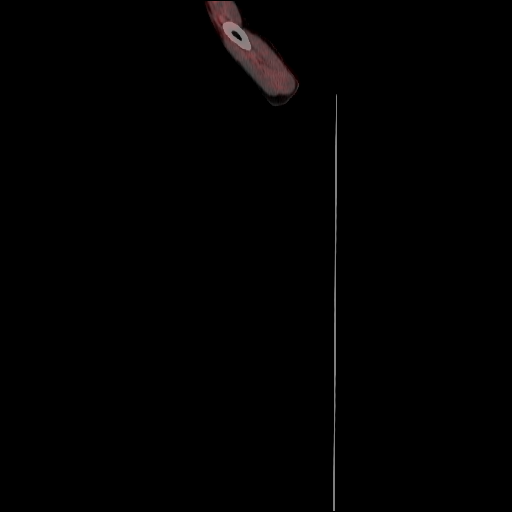

[Series 606: pet axial · 4 of 327 slices shown]
[im 1/327]
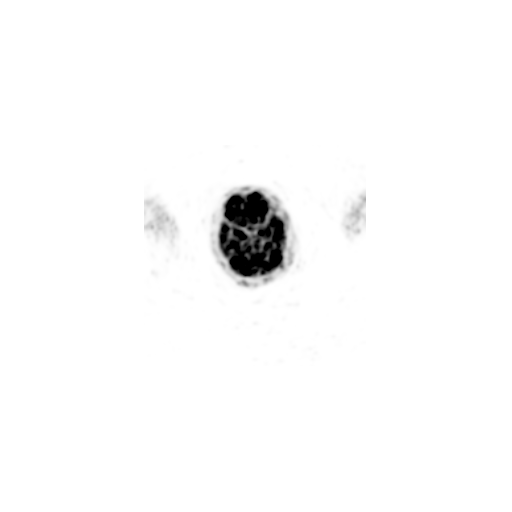
[im 109/327]
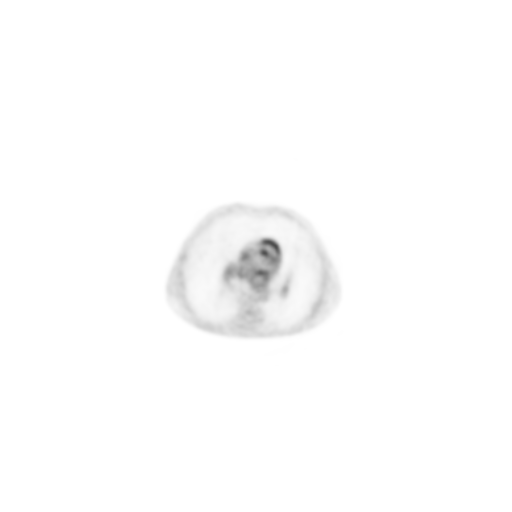
[im 218/327]
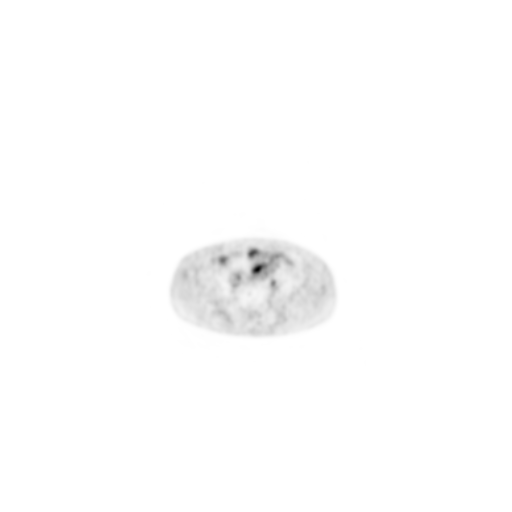
[im 327/327]
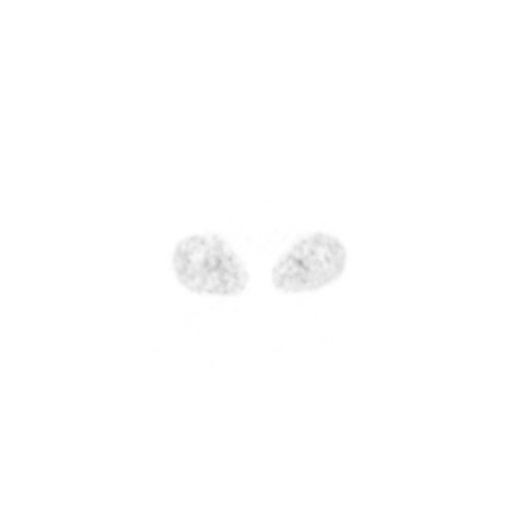

[Series 607: pet coronal · 1 of 103 slices shown]
[im 1/103]
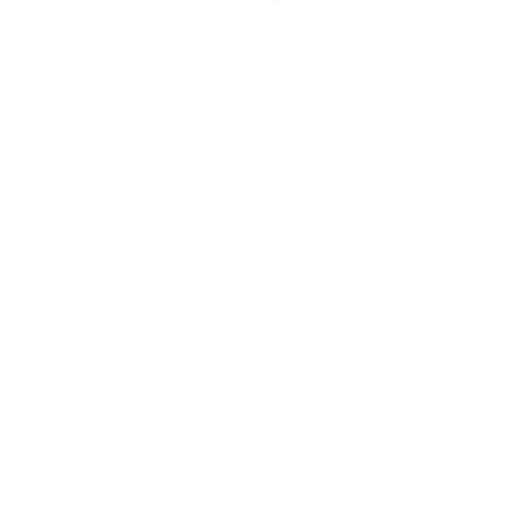

[Series 608: pet sagittal · 2 of 141 slices shown]
[im 1/141]
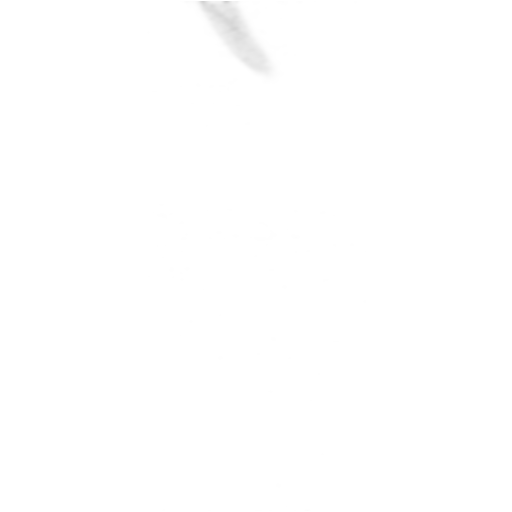
[im 141/141]
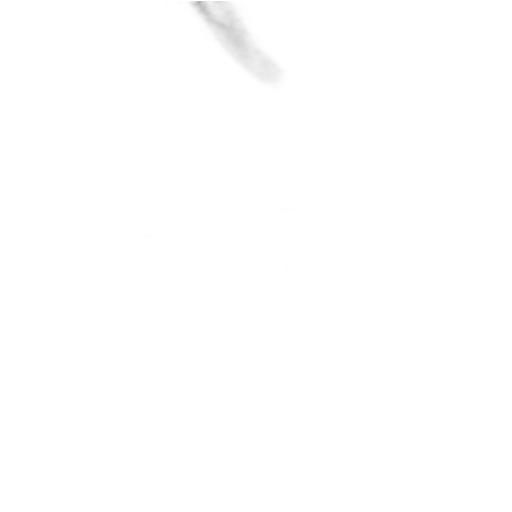

[25 of 25 positions shown; findings below may reference images not displayed]

FINDINGS: NECK: No hypermetabolic lymph nodes in the neck.

CHEST: The new right upper lobe pulmonary nodule measures 1.8 by
cm on both studies with a maximum SUV of 1.4. The new nodule in the
left upper lobe measures 16 by 13 mm on today's study versus 17 x 13
mm previously with a maximum SUV of 5.8. No other abnormal FDG
uptake in the chest.

ABDOMEN/PELVIS: There is a nodule in the left adrenal gland with a
maximum SUV of less than 0 Hounsfield units consistent with an
adenoma. No significant uptake seen in either adrenal gland. There
is a probable small adenoma in the right adrenal gland as well.
Atherosclerotic changes seen in the nonaneurysmal aorta. The penile
implant reservoir is collapsed. No other significant CT findings. No
other abnormal uptake in the abdomen or pelvis.

SKELETON: No focal hypermetabolic activity to suggest skeletal
metastasis.
IMPRESSION: 1. The new left upper lobe nodule is FDG avid consistent with
malignancy. Recommend tissue confirmation.
2. The new nodule in the right upper lobe demonstrates low level
uptake. While the level of uptake suggests the possibility of an
inflammatory or infectious process, a low-grade malignancy is not
excluded. Given the suspected malignancy on the left, tissue
confirmation should be considered on the right despite the low level
of uptake.
3. No distant metastatic disease identified.

## 2017-10-13 MED ORDER — FLUDEOXYGLUCOSE F - 18 (FDG) INJECTION
12.0000 | Freq: Once | INTRAVENOUS | Status: AC | PRN
  Administered 2017-10-13: 13.18 via INTRAVENOUS

## 2017-10-14 ENCOUNTER — Encounter: Payer: Self-pay | Admitting: *Deleted

## 2017-10-14 ENCOUNTER — Other Ambulatory Visit: Payer: Self-pay | Admitting: Family Medicine

## 2017-10-14 DIAGNOSIS — R911 Solitary pulmonary nodule: Secondary | ICD-10-CM

## 2017-10-14 DIAGNOSIS — R918 Other nonspecific abnormal finding of lung field: Secondary | ICD-10-CM

## 2017-10-14 NOTE — Progress Notes (Signed)
  Oncology Nurse Navigator Documentation  Navigator Location: [facility] (10/14/17 1400) Referral date to RadOnc/MedOnc: 10/14/17 (10/14/17 1400) )Navigator Encounter Type: Telephone (10/14/17 1400) Telephone: Incoming Call (10/14/17 1400) Abnormal Finding Date: 10/04/17 (10/14/17 1400)                   Treatment Phase: Abnormal Scans (10/14/17 1400) Barriers/Navigation Needs: Coordination of Care (10/14/17 1400)   Interventions: Coordination of Care (10/14/17 1400)   Coordination of Care: Appts (10/14/17 1400)        Acuity: Level 2 (10/14/17 1400)   Acuity Level 2: Assistance expediting appointments (10/14/17 1400)  referral received from [name], MD for bilateral pulmonary nodules. PET scan reviewed by Dr. Mike Gip who recommended pt be added to tumor board to review images and offer further discussion regarding biopsy planning. Will contact patient on Monday to further discuss next steps.    Time Spent with Patient: 30 (10/14/17 1400)

## 2017-10-17 ENCOUNTER — Ambulatory Visit (INDEPENDENT_AMBULATORY_CARE_PROVIDER_SITE_OTHER): Payer: BLUE CROSS/BLUE SHIELD | Admitting: Cardiovascular Disease

## 2017-10-17 ENCOUNTER — Encounter: Payer: Self-pay | Admitting: Cardiovascular Disease

## 2017-10-17 ENCOUNTER — Encounter: Payer: Self-pay | Admitting: *Deleted

## 2017-10-17 VITALS — BP 100/66 | HR 82 | Ht 70.0 in | Wt 145.0 lb

## 2017-10-17 DIAGNOSIS — I5022 Chronic systolic (congestive) heart failure: Secondary | ICD-10-CM | POA: Diagnosis not present

## 2017-10-17 DIAGNOSIS — I10 Essential (primary) hypertension: Secondary | ICD-10-CM

## 2017-10-17 DIAGNOSIS — I2510 Atherosclerotic heart disease of native coronary artery without angina pectoris: Secondary | ICD-10-CM | POA: Diagnosis not present

## 2017-10-17 MED ORDER — NITROGLYCERIN 0.4 MG SL SUBL: 0.4000 mg | SUBLINGUAL_TABLET | SUBLINGUAL | 3 refills | Status: DC | PRN

## 2017-10-17 NOTE — Progress Notes (Signed)
Cardiology Office Note   Date:  10/17/2017   ID:  Leslie Sims, DOB [birth year], MRN [id number]  PCP:  [name], MD  Cardiologist:   Kathlyn Sacramento, MD   Chief Complaint  Patient presents with  . other    6 month follow up. Patient states he lost his wife 3 months ago. Patient c/o SOB but thinks it is from COPD. Meds reviewed verbally with patient.       History of Present Illness: Fontaine Hehl is a 66 y.o. male who presents for a follow-up visit regarding coronary artery disease and ischemic cardiomyopathy. He had previous anterior myocardial infarction in December of 2010. His LAD was occluded and RCA had a 99% stenosis. He had an angioplasty and drug-eluting stent placement to both LAD and RCA at that time. 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  Carotid Doppler in November, 2015 showed only mild bilateral carotid stenosis which remained stable in 2017. He was seen recently for significant worsening of exertional dyspnea and chest pain . He has been dealing with significant anxiety symptoms. Most recent cardiac catheterization in November 2017 showed patent LAD and RCA stents with no evidence of obstructive disease. Ejection fraction was 35-40% with severe anterior and apical hypokinesis. Left ventricular end-diastolic pressure was moderately elevated. Imdur did not help with his symptoms.   He has been stable from a cardiac standpoint.  However, he was recently diagnosed with a lung nodule suspicious for cancer.  He has an appointment scheduled with oncology to discuss options.  He has been under significant stress after the death of his wife 3 months ago.  Also his sister died yesterday.  No chest pain or worsening dyspnea.   Past Medical History:  Diagnosis Date  . Allergic rhinitis   . Anxiety   . Atherosclerosis   . Back pain   . Bronchitis    09-14-11  . CARDIOMYOPATHY 03/24/2010   did not tolerate Toprol and Lisinopril.   .  Chest pain   . Chronic airway obstruction, not elsewhere classified   . Colon polyps 08/2012   colonoscopy; multiple colon polyps; repeat colonoscopy in one year.  [name] COPD (chronic obstructive pulmonary disease) ([place])   . Coronary artery disease 11/2009   Anterior MI with late presentation 11/2009. Cath showed a 100% proximal LAD occlusion, 99% mid RCA. Had a PCI and 2 DES to both LAD and RCA. EF was 35%. Nuclear stress test 05/13: prior anterior/inferior infarcts without ischemic, EF 43%, cardiac cath in 02/14: Widely patent stents with no other obstructive disease. EF 40%   . Depression   . Diabetes mellitus without complication ([place])   . GERD (gastroesophageal reflux disease)   . Headache(784.0)   . Helicobacter pylori (H. pylori)   . Hemorrhoids   . Hernia    inguinal  . Hyperlipidemia   . Hypertension   . Insomnia   . MI (myocardial infarction) ([place])   . Myalgia   . Peptic ulcer   . Shortness of breath dyspnea   . Status post dilation of esophageal narrowing 2000  . Thyroid dysfunction   . Tinea pedis   . Wears dentures    partial upper    Past Surgical History:  Procedure Laterality Date  . Admission  12/20/2012   COPD exacerbation.  [place].  [name] CARDIAC CATHETERIZATION    . CARDIAC CATHETERIZATION  02/05/13   [facility]  . CARDIAC CATHETERIZATION  10/14   [facility] : patent stents  with no change in anatomy. EF: 40$  . CARDIAC CATHETERIZATION Left 11/12/2016   Procedure: Left Heart Cath and Coronary Angiography;  Surgeon: Wellington Hampshire, MD;  Location: [facility];  Service: Cardiovascular;  Laterality: Left;  . COLONOSCOPY    . COLONOSCOPY WITH PROPOFOL N/A 05/18/2016   Procedure: COLONOSCOPY WITH PROPOFOL;  Surgeon: Lucilla Lame, MD;  Location: [facility] END[facility]COPY;  Service: Endoscopy;  Laterality: N/A;  . CORONARY ANGIOPLASTY WITH STENT PLACEMENT  09/2009   LAD 3.0 X23 mm Xience DES, RCA: 4.0 X 15 mm Xience DES  . ESOPHAGEAL DILATION    . ESOPHAGOGASTRODUODENOSCOPY  2008  .  ESOPHAGOGASTRODUODENOSCOPY  08/20/2012  . HERNIA REPAIR  07/20/2012   L inguinal hernia repair  . PENILE PROSTHESIS IMPLANT       Current Outpatient Prescriptions  Medication Sig Dispense Refill  . albuterol (PROVENTIL) (2.5 MG/3ML) 0.083% nebulizer solution USE 1 VIAL (3 ML) BY NEBULIZATION EVERY 6 HOURS AS NEEDED FOR WHEEZING OR SHORTNESS OF BREATH 1080 mL 3  . albuterol (VENTOLIN HFA) 108 (90 Base) MCG/ACT inhaler USE 2 INHALATIONS EVERY 6 HOURS AS NEEDED FOR WHEEZING 48 g 3  . aspirin 81 MG tablet Take 81 mg by mouth daily.      [name] atorvastatin (LIPITOR) 40 MG tablet Take 1 tablet (40 mg total) by mouth daily. 90 tablet 3  . azelastine (ASTELIN) 0.1 % nasal spray Place 2 sprays into both nostrils 2 (two) times daily. Use in each nostril as directed 30 mL 12  . fluticasone (FLONASE) 50 MCG/ACT nasal spray Place 1 spray into both nostrils 2 (two) times daily. 48 g 3  . Fluticasone-Salmeterol (ADVAIR DISKUS) 500-50 MCG/DOSE AEPB Inhale 1 puff into the lungs 2 (two) times daily. 180 each 3  . furosemide (LASIX) 20 MG tablet Take 1 tablet (20 mg total) by mouth daily. 90 tablet 1  . ipratropium-albuterol (DUONEB) 0.5-2.5 (3) MG/3ML SOLN INHALE 1 VIAL BY NEBULIZER EVERY 6 HOURS AS NEEDED 270 mL 3  . ketoconazole (NIZORAL) 2 % cream Apply 1 application topically 2 (two) times daily. 60 g 0  . LORazepam (ATIVAN) 0.5 MG tablet Take 1 tablet (0.5 mg total) by mouth every 6 (six) hours as needed. for anxiety 120 tablet 2  . losartan (COZAAR) 25 MG tablet Take 1 tablet (25 mg total) by mouth daily. 90 tablet 3  . mirtazapine (REMERON) 15 MG tablet Take 1 tablet (15 mg total) by mouth at bedtime. 30 tablet 5  . Multiple Vitamin (MULTIVITAMIN) capsule Take 1 capsule by mouth daily.    . nitroGLYCERIN (NITROSTAT) 0.4 MG SL tablet Place 1 tablet (0.4 mg total) under the tongue every 5 (five) minutes as needed. 25 tablet 3  . nystatin (MYCOSTATIN) 100000 UNIT/ML suspension Take 5 mLs (500,000 Units total) by  mouth 4 (four) times daily. 120 mL 0  . sertraline (ZOLOFT) 50 MG tablet Take 2-3 tablets (100-150 mg total) by mouth daily. 270 tablet 1  . sucralfate (CARAFATE) 1 GM/10ML suspension Take 10 mLs (1 g total) by mouth 4 (four) times daily -  with meals and at bedtime. 420 mL 4  . tiotropium (SPIRIVA HANDIHALER) 18 MCG inhalation capsule Place 1 capsule (18 mcg total) into inhaler and inhale daily. 90 capsule 3  . sodium chloride (OCEAN) 0.65 % SOLN nasal spray Place 2 sprays into both nostrils every 2 (two) hours while awake. 88 mL 5   No current facility-administered medications for this visit.     Allergies:  Effexor xr [venlafaxine hcl er] and Wellbutrin [bupropion]    Social History:  The patient  reports that he has been smoking Cigarettes.  He has a 21.00 pack-year smoking history. He has never used smokeless tobacco. He reports that he does not drink alcohol or use drugs.   Family History:  The patient's family history includes Alcohol abuse in his sister and sister; COPD in his sister; COPD (age of onset: 79) in his mother; Coronary artery disease (age of onset: 40) in his father; Diabetes in his brother and father; Heart attack in his brother and father; Heart attack (age of onset: 74) in his sister; Heart disease in his brother and father; Hypertension in his brother.    ROS:  Please see the history of present illness.   Otherwise, review of systems are positive for none.   All other systems are reviewed and negative.    PHYSICAL EXAM: VS:  BP 100/66 (BP Location: Left Arm, Patient Position: Sitting, Cuff Size: Normal)   Pulse 82   Ht 5\' 10"  (1.778 m)   Wt 145 lb (65.8 kg)   BMI 20.81 kg/m  , BMI Body mass index is 20.81 kg/m. GEN: Well nourished, well developed, in no acute distress  HEENT: normal  Neck: no JVD, carotid bruits, or masses Cardiac: RRR; no murmurs, rubs, or gallops,no edema  Respiratory:  clear to auscultation bilaterally With diminished breath sounds, normal  work of breathing GI: soft, nontender, nondistended, + BS MS: no deformity or atrophy  Skin: warm and dry, no rash Neuro:  Strength and sensation are intact Psych: euthymic mood, full affect Right radial pulse is normal with no hematoma.  EKG:  EKG is ordered today. The ekg ordered today demonstrates normal sinus rhythm with old anteroseptal infarct. Left anterior fascicular block.   Recent Labs: 06/17/2017: Platelets 258; TSH 3.330 08/19/2017: ALT 18; BUN 11; Creatinine, Ser 0.82; Hemoglobin 13.7; Potassium 4.0; Sodium 141    Lipid Panel    Component Value Date/Time   CHOL 143 07/27/2017 1525   TRIG 72 07/27/2017 1525   TRIG 65 01/15/2010   HDL 62 07/27/2017 1525   CHOLHDL 2.3 07/27/2017 1525   CHOLHDL 1.8 08/24/2016 1344   VLDL 25 08/24/2016 1344   LDLCALC 67 07/27/2017 1525      Wt Readings from Last 3 Encounters:  10/17/17 145 lb (65.8 kg)  10/05/17 145 lb (65.8 kg)  09/07/17 143 lb (64.9 kg)        ASSESSMENT AND PLAN:  1.  Coronary artery disease involving native coronary arteries without angina:    Cardiac catheterization in November of last year showed patent LAD and RCA stents.  Continue medical therapy.  His symptoms are stable.  2.  Chronic systolic heart failure: Most recent ejection fraction was 35-40. He did not tolerate treatment with a beta blocker due to worsening dyspnea.  Currently on low-dose losartan.  He appears to be euvolemic on small dose of furosemide.  3. Hyperlipidemia: Continue treatment with atorvastatin. Most recent LDL was 67  4. Tobacco use: He is trying to cut down on smoking again but he has not been successful in the past in quitting completely.  5.  Preop cardiovascular evaluation.  The patient might require lung resection.  He is considered moderate risk from a cardiac standpoint if surgery is needed.  Disposition:   FU with me in 6 months  Signed,  Kathlyn Sacramento, MD  10/17/2017 2:08 PM    [facility]

## 2017-10-17 NOTE — Patient Instructions (Signed)
Medication Instructions: Continue same medications.   Labwork: None.   Procedures/Testing: None.   Follow-Up: 6 months with Dr. Arely Tinner.   Any Additional Special Instructions Will Be Listed Below (If Applicable).     If you need a refill on your cardiac medications before your next appointment, please call your pharmacy.   

## 2017-10-17 NOTE — Progress Notes (Signed)
  Oncology Nurse Navigator Documentation  Navigator Location: [facility] (10/17/17 1100)   )Navigator Encounter Type: Introductory phone call (10/17/17 1100) Telephone: Lahoma Crocker Call;Appt Confirmation/Clarification (10/17/17 1100)                           Interventions: Coordination of Care (10/17/17 1100)   Coordination of Care: Appts (10/17/17 1100)     spoke with patient to introduce to navigator services and to give appt details for new patient appt scheduled on Thurs 11/1 with Dr. Grayland Ormond at 3pm. Informed pt of what to expect at appt. Also informed pt that he may see cardiothoracic surgeon, Dr. Genevive Bi, in addition to Dr. Grayland Ormond. Informed pt that I would notify him if additional appts are scheduled for other providers. Contact info given and instructed pt to call if has any further questions or needs. Pt verbalized understanding and confirmed appt with Dr. Grayland Ormond.          Time Spent with Patient: 15 (10/17/17 1100)

## 2017-10-18 ENCOUNTER — Other Ambulatory Visit: Payer: Self-pay | Admitting: Oncology

## 2017-10-18 ENCOUNTER — Telehealth: Payer: Self-pay | Admitting: *Deleted

## 2017-10-18 DIAGNOSIS — R918 Other nonspecific abnormal finding of lung field: Secondary | ICD-10-CM

## 2017-10-18 NOTE — Telephone Encounter (Signed)
Spoke with patient to coordinate appt for PFTs. Appt given for 11/1 at 7am for PFTs. Pt declined appt and stated that it was too early. Request appt be scheduled after 10am if possible. Informed pt that PFT's will most likely be next week then could get appt for him to see Dr. Genevive Bi after PFTs for surgical resection evaluation. Informed pt that will keep appt with Dr. Grayland Ormond on Thursday 11/1 as scheduled. Pt will be notified on Thursday regarding appts for PFTs and consult with Dr. Genevive Bi. Pt verbalized understanding.

## 2017-10-18 NOTE — Progress Notes (Signed)
[place]  [phone] [phone]) [phone]  ID: Leslie Sims George OB: Sep 24, 1951  MR#: [id number]  MHD#:[id number]  Patient Care Team: [name], MD as PCP - General (Family Medicine) [name], MD (Family Medicine) Wellington Hampshire, MD as Consulting Physician (Cardiology) Telford Nab, RN as Registered Nurse  CHIEF COMPLAINT: Bilateral pulmonary nodules.  INTERVAL HISTORY: Patient is a 66 year old male along tobacco use recently had a CT and PET scan lesions in his right and left upper lobe highly suspicious for underlying malignancy.  He is anxious, but otherwise feels well.  He has no neurologic complaints.  He has a good appetite and denies weight loss.  He denies any recent fevers or illnesses. He denies any chest pain, shortness of breath, cough, or hemoptysis.  He has no nausea, vomiting, constipation, or diarrhea.  No urinary complaints.  Patient offers no further specific complaints today.  REVIEW OF SYSTEMS:   Review of Systems  Constitutional: Negative.  Negative for fever, malaise/fatigue and weight loss.  Respiratory: Negative.  Negative for cough, hemoptysis and shortness of breath.   Cardiovascular: Negative.  Negative for chest pain and leg swelling.  Gastrointestinal: Negative.  Negative for abdominal pain, blood in stool and melena.  Genitourinary: Negative.   Musculoskeletal: Negative.  Negative for joint pain.  Skin: Negative.  Negative for rash.  Neurological: Negative.  Negative for sensory change.  Psychiatric/Behavioral: Negative.  The patient is not nervous/anxious.     As per HPI. Otherwise, a complete review of systems is negative.  PAST MEDICAL HISTORY: Past Medical History:  Diagnosis Date  . Allergic rhinitis   . Anxiety   . Atherosclerosis   . Back pain   . Bronchitis    09-14-11  . CARDIOMYOPATHY 03/24/2010   did not tolerate Toprol and Lisinopril.   . Chest pain   . Chronic airway obstruction,  not elsewhere classified   . Colon polyps 08/2012   colonoscopy; multiple colon polyps; repeat colonoscopy in one year.  [name] COPD (chronic obstructive pulmonary disease) ([place])   . Coronary artery disease 11/2009   Anterior MI with late presentation 11/2009. Cath showed a 100% proximal LAD occlusion, 99% mid RCA. Had a PCI and 2 DES to both LAD and RCA. EF was 35%. Nuclear stress test 05/13: prior anterior/inferior infarcts without ischemic, EF 43%, cardiac cath in 02/14: Widely patent stents with no other obstructive disease. EF 40%   . Depression   . Diabetes mellitus without complication ([place])   . GERD (gastroesophageal reflux disease)   . Headache(784.0)   . Helicobacter pylori (H. pylori)   . Hemorrhoids   . Hernia    inguinal  . Hyperlipidemia   . Hypertension   . Insomnia   . MI (myocardial infarction) ([place])   . Myalgia   . Peptic ulcer   . Shortness of breath dyspnea   . Status post dilation of esophageal narrowing 2000  . Thyroid dysfunction   . Tinea pedis   . Wears dentures    partial upper    PAST SURGICAL HISTORY: Past Surgical History:  Procedure Laterality Date  . Admission  12/20/2012   COPD exacerbation.  [place].  [name] CARDIAC CATHETERIZATION    . CARDIAC CATHETERIZATION  02/05/13   [facility]  . CARDIAC CATHETERIZATION  10/14   [facility] : patent stents with no change in anatomy. EF: 40$  . CARDIAC CATHETERIZATION Left 11/12/2016   Procedure: Left Heart Cath and Coronary Angiography;  Surgeon: Wellington Hampshire, MD;  Location: [facility];  Service: Cardiovascular;  Laterality: Left;  . COLONOSCOPY    . COLONOSCOPY WITH PROPOFOL N/A 05/18/2016   Procedure: COLONOSCOPY WITH PROPOFOL;  Surgeon: Lucilla Lame, MD;  Location: [facility] ENDOSCOPY;  Service: Endoscopy;  Laterality: N/A;  . CORONARY ANGIOPLASTY WITH STENT PLACEMENT  09/2009   LAD 3.0 X23 mm Xience DES, RCA: 4.0 X 15 mm Xience DES  . ESOPHAGEAL DILATION    . ESOPHAGOGASTRODUODENOSCOPY  2008  .  ESOPHAGOGASTRODUODENOSCOPY  08/20/2012  . HERNIA REPAIR  07/20/2012   L inguinal hernia repair  . PENILE PROSTHESIS IMPLANT      FAMILY HISTORY: Family History  Problem Relation Age of Onset  . Heart attack Brother        Brother #1  . Diabetes Brother   . Hypertension Brother        #3  . Coronary artery disease Father 97       deceased  . Heart attack Father   . Diabetes Father   . Heart disease Father   . COPD Mother 27       deceased  . Alcohol abuse Sister        polysubstance abuse  . COPD Sister   . Lung cancer Sister   . Alcohol abuse Sister        polysubstance abuse  . Penile cancer Brother   . Diabetes Brother     ADVANCED DIRECTIVES (Y/N):  N  HEALTH MAINTENANCE: Social History  Substance Use Topics  . Smoking status: Current Every Day Smoker    Packs/day: 0.50    Years: 42.00    Types: Cigarettes  . Smokeless tobacco: Never Used     Comment: smoking 15-18 cigarettes daily  . Alcohol use No     Colonoscopy:  PAP:  Bone density:  Lipid panel:  Allergies  Allergen Reactions  . Effexor Xr [Venlafaxine Hcl Er]   . Wellbutrin [Bupropion] Other (See Comments)    Unknown/"made me feel real funny"    Current Outpatient Prescriptions  Medication Sig Dispense Refill  . albuterol (PROVENTIL) (2.5 MG/3ML) 0.083% nebulizer solution USE 1 VIAL (3 ML) BY NEBULIZATION EVERY 6 HOURS AS NEEDED FOR WHEEZING OR SHORTNESS OF BREATH 1080 mL 3  . albuterol (VENTOLIN HFA) 108 (90 Base) MCG/ACT inhaler USE 2 INHALATIONS EVERY 6 HOURS AS NEEDED FOR WHEEZING 48 g 3  . aspirin 81 MG tablet Take 81 mg by mouth daily.      [name] atorvastatin (LIPITOR) 40 MG tablet Take 1 tablet (40 mg total) by mouth daily. 90 tablet 3  . azelastine (ASTELIN) 0.1 % nasal spray Place 2 sprays into both nostrils 2 (two) times daily. Use in each nostril as directed 30 mL 12  . fluticasone (FLONASE) 50 MCG/ACT nasal spray Place 1 spray into both nostrils 2 (two) times daily. 48 g 3  .  Fluticasone-Salmeterol (ADVAIR DISKUS) 500-50 MCG/DOSE AEPB Inhale 1 puff into the lungs 2 (two) times daily. 180 each 3  . furosemide (LASIX) 20 MG tablet Take 1 tablet (20 mg total) by mouth daily. 90 tablet 1  . ipratropium-albuterol (DUONEB) 0.5-2.5 (3) MG/3ML SOLN INHALE 1 VIAL BY NEBULIZER EVERY 6 HOURS AS NEEDED 270 mL 3  . ketoconazole (NIZORAL) 2 % cream Apply 1 application topically 2 (two) times daily. 60 g 0  . LORazepam (ATIVAN) 0.5 MG tablet Take 1 tablet (0.5 mg total) by mouth every 6 (six) hours as needed. for anxiety 120 tablet 2  . losartan (COZAAR) 25  MG tablet Take 1 tablet (25 mg total) by mouth daily. 90 tablet 3  . mirtazapine (REMERON) 15 MG tablet Take 1 tablet (15 mg total) by mouth at bedtime. 30 tablet 5  . Multiple Vitamin (MULTIVITAMIN) capsule Take 1 capsule by mouth daily.    . nitroGLYCERIN (NITROSTAT) 0.4 MG SL tablet Place 1 tablet (0.4 mg total) under the tongue every 5 (five) minutes as needed. 25 tablet 3  . nystatin (MYCOSTATIN) 100000 UNIT/ML suspension Take 5 mLs (500,000 Units total) by mouth 4 (four) times daily. 120 mL 0  . sertraline (ZOLOFT) 50 MG tablet Take 2-3 tablets (100-150 mg total) by mouth daily. 270 tablet 1  . sucralfate (CARAFATE) 1 GM/10ML suspension Take 10 mLs (1 g total) by mouth 4 (four) times daily -  with meals and at bedtime. 420 mL 4  . tiotropium (SPIRIVA HANDIHALER) 18 MCG inhalation capsule Place 1 capsule (18 mcg total) into inhaler and inhale daily. 90 capsule 3  . sodium chloride (OCEAN) 0.65 % SOLN nasal spray Place 2 sprays into both nostrils every 2 (two) hours while awake. 88 mL 5   No current facility-administered medications for this visit.     OBJECTIVE: Vitals:   10/20/17 1533  BP: 109/60  Pulse: 79  Resp: 18  Temp: (!) 97.5 F (36.4 C)     Body mass index is 21.09 kg/m.    ECOG FS:0 - Asymptomatic  General: Well-developed, well-nourished, no acute distress. Eyes: Pink conjunctiva, anicteric  sclera. HEENT: Normocephalic, moist mucous membranes, clear oropharnyx. Lungs: Clear to auscultation bilaterally. Heart: Regular rate and rhythm. No rubs, murmurs, or gallops. Abdomen: Soft, nontender, nondistended. No organomegaly noted, normoactive bowel sounds. Musculoskeletal: No edema, cyanosis, or clubbing. Neuro: Alert, answering all questions appropriately. Cranial nerves grossly intact. Skin: No rashes or petechiae noted. Psych: Normal affect. Lymphatics: No cervical, calvicular, axillary or inguinal LAD.   LAB RESULTS:  Lab Results  Component Value Date   NA 141 08/19/2017   K 4.0 08/19/2017   CL 97 08/19/2017   CO2 28 08/19/2017   GLUCOSE 104 (H) 08/19/2017   BUN 11 08/19/2017   CREATININE 0.82 08/19/2017   CALCIUM 9.7 08/19/2017   PROT 6.4 08/19/2017   ALBUMIN 4.5 08/19/2017   AST 15 08/19/2017   ALT 18 08/19/2017   ALKPHOS 73 08/19/2017   BILITOT 0.2 08/19/2017   GFRNONAA 94 08/19/2017   GFRAA 109 08/19/2017    Lab Results  Component Value Date   WBC 8.2 08/19/2017   NEUTROABS 3.7 06/17/2017   HGB 13.7 (A) 08/19/2017   HCT 41.7 (A) 08/19/2017   MCV 91.0 08/19/2017   PLT 258 06/17/2017     STUDIES: Ct Chest Wo Contrast  Result Date: 10/04/2017 CLINICAL DATA:  Mild chest pain for several months, followup of small lung nodule EXAM: CT CHEST WITHOUT CONTRAST TECHNIQUE: Multidetector CT imaging of the chest was performed following the standard protocol without IV contrast. COMPARISON:  Chest x-ray of 01/15/2017 and CT chest of 06/18/2015 FINDINGS: Cardiovascular: Moderate thoracic aortic atherosclerosis is present. The heart is within normal limits in size. No pericardial effusion is noted. Cardiac stents are noted in the left anterior descending and right coronary artery distribution. The mid ascending thoracic aorta measures 30 mm in diameter. The pulmonary artery segments are slightly prominent suggesting a degree of pulmonary arterial hypertension.  Mediastinum/Nodes: No mediastinal or hilar adenopathy is noted on this unenhanced study. A calcified node is present in the right axilla on image 26 possibly due  to prior granulomatous disease. The thyroid gland is again noted to be inhomogeneous with small nodules present. The esophagus is unremarkable. Lungs/Pleura: The 2 small nodules described previously within the superior segment of the left lower lobe are no longer seen and most likely were postinflammatory. However there is a new nodular lesion within the right upper lobe on image 42 series 3 measuring 14 x 18 mm. A second new irregularly marginated nodule is present in the left upper lobe inferiorly and laterally on image 49 series 3 measuring 13 x 17 mm. These lesions are highly suspicious for primary possibly synchronous carcinoma, or metastatic involvement of the lungs. A 6 mm nodule is noted in the superior segment of the left lower lobe posteriorly on image 74. A vague nodule is present in the right middle lobe on image 108 which is not seen on sagittal images and may represent a vessel. A small calcified granuloma is present in the anterior right lower lobe on image 135 also most likely due to prior granulomatous disease. Diffuse changes of centrilobular emphysema are present. No pneumonia or pleural effusion is seen. The central airway is patent. Upper Abdomen: Bilateral adrenal adenomas appear stable, left larger than right. Calcified splenic granulomas are present. Musculoskeletal: There are degenerative changes in the mid to lower thoracic spine. No compression deformity is seen. No lytic or blastic bony lesion is seen. IMPRESSION: 1. Two new irregularly marginated nodules, one in the right upper lobe, and a second in the mid left upper lobe worrisome for either synchronous lung carcinoma or metastatic involvement of the lungs. 2. The two small nodules described on the prior dictated report have resolved in the left superior segment and most  likely were postinflammatory. 3. Diffuse centrilobular emphysema. 4. Changes of prior granulomatous disease. Electronically Signed   By: Ivar Drape M.D.   On: 10/04/2017 14:42   Nm Pet Image Initial (pi) Skull Base To Thigh  Result Date: 10/13/2017 CLINICAL DATA:  Initial treatment strategy for pulmonary nodule. EXAM: NUCLEAR MEDICINE PET SKULL BASE TO THIGH TECHNIQUE: 13.2 mCi F-18 FDG was injected intravenously. Full-ring PET imaging was performed from the skull base to thigh after the radiotracer. CT data was obtained and used for attenuation correction and anatomic localization. FASTING BLOOD GLUCOSE:  Value: 86 mg/dl COMPARISON:  CT of the chest October 04, 2017 FINDINGS: NECK: No hypermetabolic lymph nodes in the neck. CHEST: The new right upper lobe pulmonary nodule measures 1.8 by 1.4 cm on both studies with a maximum SUV of 1.4. The new nodule in the left upper lobe measures 16 by 13 mm on today's study versus 17 x 13 mm previously with a maximum SUV of 5.8. No other abnormal FDG uptake in the chest. ABDOMEN/PELVIS: There is a nodule in the left adrenal gland with a maximum SUV of less than 0 Hounsfield units consistent with an adenoma. No significant uptake seen in either adrenal gland. There is a probable small adenoma in the right adrenal gland as well. Atherosclerotic changes seen in the nonaneurysmal aorta. The penile implant reservoir is collapsed. No other significant CT findings. No other abnormal uptake in the abdomen or pelvis. SKELETON: No focal hypermetabolic activity to suggest skeletal metastasis. IMPRESSION: 1. The new left upper lobe nodule is FDG avid consistent with malignancy. Recommend tissue confirmation. 2. The new nodule in the right upper lobe demonstrates low level uptake. While the level of uptake suggests the possibility of an inflammatory or infectious process, a low-grade malignancy is not excluded. Given  the suspected malignancy on the left, tissue confirmation should be  considered on the right despite the low level of uptake. 3. No distant metastatic disease identified. Electronically Signed   By: Dorise Bullion III M.D   On: 10/13/2017 12:58    ASSESSMENT: Bilateral pulmonary nodule.  PLAN:    1. Bilateral pulmonary nodule: CT and PET results reviewed independently and reported as above.  Suspect synchronous primaries in his right and left upper lobe.  Patient was also discussed at cancer conference and has consultation with thoracic surgery on Friday, although patient is unlikely a surgical candidate. His PFTs are scheduled for next week.  Percutaneous CT-guided biopsy was thought to be high risk for pneumothorax, therefore patient was given a referral to pulmonology for consideration of bronchoscopy/ENB and biopsy of both lesions.  Patient will return to clinic in approximately 2 weeks, after his biopsy, to discuss the results and treatment planning.  Approximately 60 minutes was spent in discussion of which greater than 50% was consultation.  Patient expressed understanding and was in agreement with this plan. He also understands that He can call clinic at any time with any questions, concerns, or complaints.   Cancer Staging No matching staging information was found for the patient.  Lloyd Huger, MD   10/21/2017 9:39 AM

## 2017-10-20 ENCOUNTER — Encounter: Payer: Self-pay | Admitting: Oncology

## 2017-10-20 ENCOUNTER — Inpatient Hospital Stay: Payer: BLUE CROSS/BLUE SHIELD | Attending: Oncology | Admitting: Oncology

## 2017-10-20 VITALS — BP 109/60 | HR 79 | Temp 97.5°F | Resp 18 | Ht 70.0 in | Wt 147.0 lb

## 2017-10-20 DIAGNOSIS — I2510 Atherosclerotic heart disease of native coronary artery without angina pectoris: Secondary | ICD-10-CM | POA: Insufficient documentation

## 2017-10-20 DIAGNOSIS — J449 Chronic obstructive pulmonary disease, unspecified: Secondary | ICD-10-CM | POA: Diagnosis not present

## 2017-10-20 DIAGNOSIS — Z8711 Personal history of peptic ulcer disease: Secondary | ICD-10-CM | POA: Insufficient documentation

## 2017-10-20 DIAGNOSIS — I252 Old myocardial infarction: Secondary | ICD-10-CM | POA: Insufficient documentation

## 2017-10-20 DIAGNOSIS — R918 Other nonspecific abnormal finding of lung field: Secondary | ICD-10-CM | POA: Diagnosis not present

## 2017-10-20 DIAGNOSIS — E079 Disorder of thyroid, unspecified: Secondary | ICD-10-CM | POA: Diagnosis not present

## 2017-10-20 DIAGNOSIS — K219 Gastro-esophageal reflux disease without esophagitis: Secondary | ICD-10-CM | POA: Insufficient documentation

## 2017-10-20 DIAGNOSIS — E785 Hyperlipidemia, unspecified: Secondary | ICD-10-CM | POA: Diagnosis not present

## 2017-10-20 DIAGNOSIS — F419 Anxiety disorder, unspecified: Secondary | ICD-10-CM | POA: Diagnosis not present

## 2017-10-20 DIAGNOSIS — R419 Unspecified symptoms and signs involving cognitive functions and awareness: Secondary | ICD-10-CM | POA: Diagnosis not present

## 2017-10-20 DIAGNOSIS — Z79899 Other long term (current) drug therapy: Secondary | ICD-10-CM | POA: Insufficient documentation

## 2017-10-20 DIAGNOSIS — Z801 Family history of malignant neoplasm of trachea, bronchus and lung: Secondary | ICD-10-CM | POA: Diagnosis not present

## 2017-10-20 DIAGNOSIS — I10 Essential (primary) hypertension: Secondary | ICD-10-CM | POA: Insufficient documentation

## 2017-10-20 DIAGNOSIS — Z7982 Long term (current) use of aspirin: Secondary | ICD-10-CM | POA: Insufficient documentation

## 2017-10-20 DIAGNOSIS — F17210 Nicotine dependence, cigarettes, uncomplicated: Secondary | ICD-10-CM | POA: Insufficient documentation

## 2017-10-20 DIAGNOSIS — E119 Type 2 diabetes mellitus without complications: Secondary | ICD-10-CM | POA: Insufficient documentation

## 2017-10-21 ENCOUNTER — Encounter: Payer: Self-pay | Admitting: *Deleted

## 2017-10-21 ENCOUNTER — Ambulatory Visit (INDEPENDENT_AMBULATORY_CARE_PROVIDER_SITE_OTHER): Payer: BLUE CROSS/BLUE SHIELD | Admitting: Cardiothoracic Surgery

## 2017-10-21 ENCOUNTER — Encounter: Payer: Self-pay | Admitting: Cardiothoracic Surgery

## 2017-10-21 VITALS — BP 102/64 | HR 98 | Temp 98.3°F | Resp 14 | Ht 70.0 in | Wt 147.0 lb

## 2017-10-21 DIAGNOSIS — R918 Other nonspecific abnormal finding of lung field: Secondary | ICD-10-CM

## 2017-10-21 NOTE — Progress Notes (Signed)
Patient ID: Leslie Sims, male   DOB: [birth year], 66 y.o.   MRN: [id number]  Chief Complaint  Patient presents with  . New Patient (Initial Visit)    Bilateral Lung Nodules   Dr. Grayland Ormond Referred By Dr. Grayland Ormond Reason for Referral bilateral pulmonary nodules  HPI Location, Quality, Duration, Severity, Timing, Context, Modifying Factors, Associated Signs and Symptoms.  Leslie Sims is a 66 y.o. male.  He is a 66 year old gentleman who recently underwent a screening CT scan of the chest.  He states that the time he was in pulmonary rehab and he was recommended to have that made.  He did have that performed that revealed bilateral pulmonary nodules.  He had previously had a CT scan back in 2016 which demonstrated 2 small 3-4 mm nodules in the left lung.  These were stable but the 2 new lesions measured about 2 cm each.  A PET scan was performed.  The PET scan showed intense uptake in the left upper lobe and less conclusive uptake in the right upper lobe although both were thought to perhaps represent malignancies.  The patient was scheduled to have pulmonary function studies made yesterday but that did not happen.  He did have pulmonary function studies May from 2015 which revealed his FEV1 to be 25% of predicted.  He does continue to smoke and smokes about 1-2 packs/day.  He states that he was down to about a pack per day but with the recent loss of his wife 3 months ago has caused him to increase his tobacco usage.  He gets short of breath with activities.  He is able to walk on the level ground about 200 feet before he has to stop.  He denied any fevers chills or hemoptysis.  He denied any previous lung surgery or lung infections.   Past Medical History:  Diagnosis Date  . Allergic rhinitis   . Anxiety   . Atherosclerosis   . Back pain   . Bronchitis    09-14-11  . CARDIOMYOPATHY 03/24/2010   did not tolerate Toprol and Lisinopril.   . Chest pain   . Chronic airway  obstruction, not elsewhere classified   . Colon polyps 08/2012   colonoscopy; multiple colon polyps; repeat colonoscopy in one year.  [name] COPD (chronic obstructive pulmonary disease) ([place])   . Coronary artery disease 11/2009   Anterior MI with late presentation 11/2009. Cath showed a 100% proximal LAD occlusion, 99% mid RCA. Had a PCI and 2 DES to both LAD and RCA. EF was 35%. Nuclear stress test 05/13: prior anterior/inferior infarcts without ischemic, EF 43%, cardiac cath in 02/14: Widely patent stents with no other obstructive disease. EF 40%   . Depression   . Diabetes mellitus without complication ([place])   . GERD (gastroesophageal reflux disease)   . Headache(784.0)   . Helicobacter pylori (H. pylori)   . Hemorrhoids   . Hernia    inguinal  . Hyperlipidemia   . Hypertension   . Insomnia   . MI (myocardial infarction) ([place])   . Myalgia   . Peptic ulcer   . Shortness of breath dyspnea   . Status post dilation of esophageal narrowing 2000  . Thyroid dysfunction   . Tinea pedis   . Wears dentures    partial upper    Past Surgical History:  Procedure Laterality Date  . Admission  12/20/2012   COPD exacerbation.  [place].  [name] CARDIAC CATHETERIZATION    . CARDIAC CATHETERIZATION  02/05/13  [place]  . CARDIAC CATHETERIZATION  10/14   [facility] : patent stents with no change in anatomy. EF: 40$  . CARDIAC CATHETERIZATION Left 11/12/2016   Procedure: Left Heart Cath and Coronary Angiography;  Surgeon: Wellington Hampshire, MD;  Location: [facility];  Service: Cardiovascular;  Laterality: Left;  . COLONOSCOPY    . COLONOSCOPY WITH PROPOFOL N/A 05/18/2016   Procedure: COLONOSCOPY WITH PROPOFOL;  Surgeon: Lucilla Lame, MD;  Location: [facility]COPY;  Service: Endoscopy;  Laterality: N/A;  . CORONARY ANGIOPLASTY WITH STENT PLACEMENT  09/2009   LAD 3.0 X23 mm Xience DES, RCA: 4.0 X 15 mm Xience DES  . ESOPHAGEAL DILATION    . ESOPHAGOGASTRODUODENOSCOPY  2008  . ESOPHAGOGASTRODUODENOSCOPY   08/20/2012  . HERNIA REPAIR  07/20/2012   L inguinal hernia repair  . PENILE PROSTHESIS IMPLANT      Family History  Problem Relation Age of Onset  . Heart attack Brother        Brother #1  . Diabetes Brother   . Hypertension Brother        #3  . Coronary artery disease Father 7       deceased  . Heart attack Father   . Diabetes Father   . Heart disease Father   . COPD Mother 51       deceased  . Alcohol abuse Sister        polysubstance abuse  . COPD Sister   . Lung cancer Sister   . Alcohol abuse Sister        polysubstance abuse  . Penile cancer Brother   . Diabetes Brother     Social History Social History  Substance Use Topics  . Smoking status: Current Every Day Smoker    Packs/day: 0.50    Years: 42.00    Types: Cigarettes  . Smokeless tobacco: Never Used     Comment: smoking 15-18 cigarettes daily  . Alcohol use No    Allergies  Allergen Reactions  . Effexor Xr [Venlafaxine Hcl Er]   . Wellbutrin [Bupropion] Other (See Comments)    Unknown/"made me feel real funny"    Current Outpatient Prescriptions  Medication Sig Dispense Refill  . albuterol (PROVENTIL) (2.5 MG/3ML) 0.083% nebulizer solution USE 1 VIAL (3 ML) BY NEBULIZATION EVERY 6 HOURS AS NEEDED FOR WHEEZING OR SHORTNESS OF BREATH 1080 mL 3  . albuterol (VENTOLIN HFA) 108 (90 Base) MCG/ACT inhaler USE 2 INHALATIONS EVERY 6 HOURS AS NEEDED FOR WHEEZING 48 g 3  . aspirin 81 MG tablet Take 81 mg by mouth daily.      [name] atorvastatin (LIPITOR) 40 MG tablet Take 1 tablet (40 mg total) by mouth daily. 90 tablet 3  . azelastine (ASTELIN) 0.1 % nasal spray Place 2 sprays into both nostrils 2 (two) times daily. Use in each nostril as directed 30 mL 12  . fluticasone (FLONASE) 50 MCG/ACT nasal spray Place 1 spray into both nostrils 2 (two) times daily. 48 g 3  . Fluticasone-Salmeterol (ADVAIR DISKUS) 500-50 MCG/DOSE AEPB Inhale 1 puff into the lungs 2 (two) times daily. 180 each 3  . furosemide (LASIX) 20 MG  tablet Take 1 tablet (20 mg total) by mouth daily. 90 tablet 1  . ipratropium-albuterol (DUONEB) 0.5-2.5 (3) MG/3ML SOLN INHALE 1 VIAL BY NEBULIZER EVERY 6 HOURS AS NEEDED 270 mL 3  . ketoconazole (NIZORAL) 2 % cream Apply 1 application topically 2 (two) times daily. 60 g 0  . LORazepam (ATIVAN) 0.5 MG tablet Take  1 tablet (0.5 mg total) by mouth every 6 (six) hours as needed. for anxiety 120 tablet 2  . losartan (COZAAR) 25 MG tablet Take 1 tablet (25 mg total) by mouth daily. 90 tablet 3  . mirtazapine (REMERON) 15 MG tablet Take 1 tablet (15 mg total) by mouth at bedtime. 30 tablet 5  . Multiple Vitamin (MULTIVITAMIN) capsule Take 1 capsule by mouth daily.    . nitroGLYCERIN (NITROSTAT) 0.4 MG SL tablet Place 1 tablet (0.4 mg total) under the tongue every 5 (five) minutes as needed. 25 tablet 3  . nystatin (MYCOSTATIN) 100000 UNIT/ML suspension Take 5 mLs (500,000 Units total) by mouth 4 (four) times daily. 120 mL 0  . sertraline (ZOLOFT) 50 MG tablet Take 2-3 tablets (100-150 mg total) by mouth daily. 270 tablet 1  . sucralfate (CARAFATE) 1 GM/10ML suspension Take 10 mLs (1 g total) by mouth 4 (four) times daily -  with meals and at bedtime. 420 mL 4  . tiotropium (SPIRIVA HANDIHALER) 18 MCG inhalation capsule Place 1 capsule (18 mcg total) into inhaler and inhale daily. 90 capsule 3  . sodium chloride (OCEAN) 0.65 % SOLN nasal spray Place 2 sprays into both nostrils every 2 (two) hours while awake. 88 mL 5   No current facility-administered medications for this visit.       Review of Systems A complete review of systems was asked and was negative except for the following positive findings shortness of breath, weight loss, cough, depression, easy bruising.  Blood pressure 102/64, pulse 98, temperature 98.3 F (36.8 C), temperature source Oral, resp. rate 14, height 5\' 10"  (1.778 m), weight 147 lb (66.7 kg), SpO2 95 %.  Physical Exam CONSTITUTIONAL:  Pleasant, well-developed,  well-nourished, and in no acute distress. EYES: Pupils equal and reactive to light, Sclera non-icteric EARS, NOSE, MOUTH AND THROAT:  The oropharynx was clear.  Dentition is good repair.  Oral mucosa pink and moist. LYMPH NODES:  Lymph nodes in the neck and axillae were normal RESPIRATORY:  Lungs were clear.  Normal respiratory effort without pathologic use of accessory muscles of respiration CARDIOVASCULAR: Heart was regular without murmurs.  There were no carotid bruits. GI: The abdomen was soft, nontender, and nondistended. There were no palpable masses. There was no hepatosplenomegaly. There were normal bowel sounds in all quadrants. GU:  Rectal deferred.   MUSCULOSKELETAL:  Normal muscle strength and tone.  No clubbing or cyanosis.   SKIN:  There were no pathologic skin lesions.  There were no nodules on palpation. NEUROLOGIC:  Sensation is normal.  Cranial nerves are grossly intact. PSYCH:  Oriented to person, place and time.  Mood and affect are normal.  Data Reviewed CT scans  I have personally reviewed the patient's imaging, laboratory findings and medical records.    Assessment    I have independently reviewed the patient's CT scans.  There is a dominant right upper and left upper lobe nodules.  I believe that the left upper lobe nodule is almost certainly malignant in the right upper lobe nodule certainly has the possibility of malignancy.    Plan    The patient will keep his appointment on November 13 to have his pulmonary function studies made.  These were rescheduled from yesterday.  He will follow-up with her pulmonologist next week for ENB.  Given his pulmonary functions from several years ago I do not believe that he is a candidate for surgical resection.  Thank you very much for allowing me to participate in  his care.       Nestor Lewandowsky, MD 10/21/2017, 12:03 PM

## 2017-10-21 NOTE — Progress Notes (Signed)
  Oncology Nurse Navigator Documentation  Navigator Location: [facility] (10/20/17 1600)   )Navigator Encounter Type: Initial MedOnc (10/20/17 1600)                         Barriers/Navigation Needs: Coordination of Care (10/20/17 1600)   Interventions: Coordination of Care;Referrals (10/20/17 1600)   Coordination of Care: Appts;Broncoscopy (10/20/17 1600)       met with patient during initial consultation with Dr. Grayland Ormond on 10/20/17. All questions answered at the time of visit. Pt discussed at case conference on 11/1 and overall consensus was to refer to surgery for possible resection and if not surgical candidate then pt would need to be referred to pulmonary for biopsy of both pulmonary nodules. Per Dr. Kathlene Cote, pt high risk for percutaneous biopsy and bronchoscopy was determined safer route for biopsy.   Informed pt that will need referral to pulmonary if not surgical candidate for biopsy of both pulmonary nodules. Pt scheduled to see Dr. Genevive Bi on 11/2. Scheduled to see pulmonary on 11/5. Attempted to schedule pt for PFTs sooner but pt request appt time later in the AM. PFTs scheduled for 11/13 if needed. Contact info given to patient and instructed to call with any further questions. Pt informed that will follow up with Dr. Grayland Ormond after biopsy to review results and discuss treatment planning if not a surgical candidate. Pt verbalized understanding. No further questions at this time.            Time Spent with Patient: 60 (10/20/17 1600)

## 2017-10-24 ENCOUNTER — Ambulatory Visit: Payer: BLUE CROSS/BLUE SHIELD | Admitting: Pulmonary Disease

## 2017-10-24 ENCOUNTER — Encounter: Payer: Self-pay | Admitting: Pulmonary Disease

## 2017-10-24 VITALS — BP 120/60 | HR 104 | Ht 70.0 in | Wt 151.0 lb

## 2017-10-24 DIAGNOSIS — R918 Other nonspecific abnormal finding of lung field: Secondary | ICD-10-CM

## 2017-10-24 DIAGNOSIS — J449 Chronic obstructive pulmonary disease, unspecified: Secondary | ICD-10-CM

## 2017-10-24 DIAGNOSIS — F17200 Nicotine dependence, unspecified, uncomplicated: Secondary | ICD-10-CM | POA: Diagnosis not present

## 2017-10-24 DIAGNOSIS — J9611 Chronic respiratory failure with hypoxia: Secondary | ICD-10-CM | POA: Diagnosis not present

## 2017-10-24 NOTE — Progress Notes (Signed)
PULMONARY OFFICE FOLLOW UP NOTE  Requesting MD/Service: [name], MD Date of initial consultation: 08/10/16 Reason for consultation: COPD, smoker  PT PROFILE: 72 M smoker with severe emphysema seen by multiple pulmonologists in past ([place], [place], [place], [place]) referred for further eval and mgmt of COPD   DATA: 07/24/12 Office Spirometry: Severe obstruction (FEV1 31% pred) 08/31/13 Office Spirometry: very severe obstruction (FEV1 25% pred) 06/18/15 CT chest: Severe emphysema 07/27/16 CXR: Severe hyperinflation and hyperlucency 03/03/17 Overnight oximetry: Lowest SpO2 72%. 95% of time SpO2 was below 90% 03/03/17 6MWT: 384 meters. Desat to 84% 10/04/17 CT chest: Two new irregularly marginated nodules, one in the right upper lobe, and a second in the mid left upper lobe worrisome for either synchronous lung carcinoma or metastatic involvement of the lungs. The two small nodules described on the prior dictated report have resolved in the left superior segment and most likely were postinflammatory. Diffuse centrilobular emphysema. 10/13/17 PET scan: The new left upper lobe nodule is FDG avid consistent with malignancy. Recommend tissue confirmation. The new nodule in the right upper lobe demonstrates low level uptake. While the level of uptake suggests the possibility of an inflammatory or infectious process, a low-grade malignancy is not excluded. Given the suspected malignancy on the left, tissue confirmation should be considered on the right despite the low level of uptake. No distant metastatic disease identified  INTERVAL: Seen by other providers including Dr. Grayland Ormond CT chest and PET scan with results as documented above   SUBJ: Referred back to me for consideration of bronchoscopic biopsy of the newly detected bilateral pulmonary opacities/masses.  He continues to smoke 1-1.5 packs of cigarettes per day.  He continues to have class III dyspnea.  He continues to have mild  intermittent cough with occasional mucus production.   OBJ: Vitals:   10/24/17 1547 10/24/17 1550  BP:  120/60  Pulse:  (!) 104  SpO2:  94%  Weight: 68.5 kg (151 lb)   Height: 5\' 10"  (1.778 m)   RA  EXAM:  Gen: Appears much older than his true age, No overt respiratory distress HEENT: NCAT, sclera white, oropharynx normal Neck: Supple without LAN, thyromegaly, JVD Lungs: breath sounds moderately diminished, no wheezes Cardiovascular: RRR, no murmurs noted Abdomen: Soft, nontender, normal BS Ext: without clubbing, cyanosis, edema Neuro: CNs grossly intact, motor and sensory intact  DATA:   BMP Latest Ref Rng & Units 08/19/2017 07/27/2017 06/17/2017  Glucose 65 - 99 mg/dL 104(H) 77 98  BUN 8 - 27 mg/dL 11 11 8   Creatinine 0.76 - 1.27 mg/dL 0.82 0.75(L) 0.73(L)  BUN/Creat Ratio 10 - 24 13 15 11   Sodium 134 - 144 mmol/L 141 142 143  Potassium 3.5 - 5.2 mmol/L 4.0 3.9 3.8  Chloride 96 - 106 mmol/L 97 98 98  CO2 20 - 29 mmol/L 28 30(H) 28  Calcium 8.6 - 10.2 mg/dL 9.7 9.8 9.4    CBC Latest Ref Rng & Units 08/19/2017 06/17/2017 03/01/2017  WBC 4.6 - 10.2 K/uL 8.2 7.2 7.5  Hemoglobin 14.1 - 18.1 g/dL 13.7(A) 13.4 13.5  Hematocrit 43.5 - 53.7 % 41.7(A) 41.0 40.1  Platelets 150 - 379 x10E3/uL - 258 277   ONO, 6 MWT as above CXR:  NNF  IMPRESSION:   Smoker  COPD, severe   Bilateral lung nodules -FDG avid (left nodule more so than the right) - Possibly synchronous malignancies  Chronic hypoxemic respiratory failure    We discussed the likely diagnosis of lung cancer and possible diagnostic strategies.  I agree with the previous assessment that transthoracic approach would carry a very high risk of pneumothorax.  I will discuss the possibility of a navigation bronchoscopy with Dr. Mortimer Fries  PLAN:  I will review scans with Dr. Mortimer Fries and anticipate proceeding with navigation bronchoscopy.  Discussed with the patient the procedure of bronchoscopy including its indication, risks and  alternatives.  He agrees to proceed.  Follow-up will be arranged after that procedure.   Merton Border, MD PCCM service Mobile [phone] Pager [phone] 10/25/2017  4:07 PM

## 2017-10-24 NOTE — Patient Instructions (Signed)
I will discuss with Dr. Mortimer Fries to arrange a navigation bronchoscopy for biopsy of the lung nodule noted on recent CT scan of the chest  My office will contact you regarding the scheduling of this procedure and any other testing that needs to be done  Follow-up will be arranged

## 2017-10-24 NOTE — H&P (View-Only) (Signed)
PULMONARY OFFICE FOLLOW UP NOTE  Requesting MD/Service: [name], MD Date of initial consultation: 08/10/16 Reason for consultation: COPD, smoker  PT PROFILE: 80 M smoker with severe emphysema seen by multiple pulmonologists in past ([facility], [place], [place], [place]) referred for further eval and mgmt of COPD   DATA: 07/24/12 Office Spirometry: Severe obstruction (FEV1 31% pred) 08/31/13 Office Spirometry: very severe obstruction (FEV1 25% pred) 06/18/15 CT chest: Severe emphysema 07/27/16 CXR: Severe hyperinflation and hyperlucency 03/03/17 Overnight oximetry: Lowest SpO2 72%. 95% of time SpO2 was below 90% 03/03/17 6MWT: 384 meters. Desat to 84% 10/04/17 CT chest: Two new irregularly marginated nodules, one in the right upper lobe, and a second in the mid left upper lobe worrisome for either synchronous lung carcinoma or metastatic involvement of the lungs. The two small nodules described on the prior dictated report have resolved in the left superior segment and most likely were postinflammatory. Diffuse centrilobular emphysema. 10/13/17 PET scan: The new left upper lobe nodule is FDG avid consistent with malignancy. Recommend tissue confirmation. The new nodule in the right upper lobe demonstrates low level uptake. While the level of uptake suggests the possibility of an inflammatory or infectious process, a low-grade malignancy is not excluded. Given the suspected malignancy on the left, tissue confirmation should be considered on the right despite the low level of uptake. No distant metastatic disease identified  INTERVAL: Seen by other providers including Dr. Grayland Ormond CT chest and PET scan with results as documented above   SUBJ: Referred back to me for consideration of bronchoscopic biopsy of the newly detected bilateral pulmonary opacities/masses.  He continues to smoke 1-1.5 packs of cigarettes per day.  He continues to have class III dyspnea.  He continues to have mild  intermittent cough with occasional mucus production.   OBJ: Vitals:   10/24/17 1547 10/24/17 1550  BP:  120/60  Pulse:  (!) 104  SpO2:  94%  Weight: 68.5 kg (151 lb)   Height: 5\' 10"  (1.778 m)   RA  EXAM:  Gen: Appears much older than his true age, No overt respiratory distress HEENT: NCAT, sclera white, oropharynx normal Neck: Supple without LAN, thyromegaly, JVD Lungs: breath sounds moderately diminished, no wheezes Cardiovascular: RRR, no murmurs noted Abdomen: Soft, nontender, normal BS Ext: without clubbing, cyanosis, edema Neuro: CNs grossly intact, motor and sensory intact  DATA:   BMP Latest Ref Rng & Units 08/19/2017 07/27/2017 06/17/2017  Glucose 65 - 99 mg/dL 104(H) 77 98  BUN 8 - 27 mg/dL 11 11 8   Creatinine 0.76 - 1.27 mg/dL 0.82 0.75(L) 0.73(L)  BUN/Creat Ratio 10 - 24 13 15 11   Sodium 134 - 144 mmol/L 141 142 143  Potassium 3.5 - 5.2 mmol/L 4.0 3.9 3.8  Chloride 96 - 106 mmol/L 97 98 98  CO2 20 - 29 mmol/L 28 30(H) 28  Calcium 8.6 - 10.2 mg/dL 9.7 9.8 9.4    CBC Latest Ref Rng & Units 08/19/2017 06/17/2017 03/01/2017  WBC 4.6 - 10.2 K/uL 8.2 7.2 7.5  Hemoglobin 14.1 - 18.1 g/dL 13.7(A) 13.4 13.5  Hematocrit 43.5 - 53.7 % 41.7(A) 41.0 40.1  Platelets 150 - 379 x10E3/uL - 258 277   ONO, 6 MWT as above CXR:  NNF  IMPRESSION:   Smoker  COPD, severe   Bilateral lung nodules -FDG avid (left nodule more so than the right) - Possibly synchronous malignancies  Chronic hypoxemic respiratory failure    We discussed the likely diagnosis of lung cancer and possible diagnostic strategies.  I agree with the previous assessment that transthoracic approach would carry a very high risk of pneumothorax.  I will discuss the possibility of a navigation bronchoscopy with Dr. Mortimer Fries  PLAN:  I will review scans with Dr. Mortimer Fries and anticipate proceeding with navigation bronchoscopy.  Discussed with the patient the procedure of bronchoscopy including its indication, risks and  alternatives.  He agrees to proceed.  Follow-up will be arranged after that procedure.   Merton Border, MD PCCM service Mobile [phone] Pager [phone] 10/25/2017  4:07 PM

## 2017-10-25 ENCOUNTER — Encounter: Payer: Self-pay | Admitting: Pulmonary Disease

## 2017-10-25 ENCOUNTER — Other Ambulatory Visit: Payer: Self-pay | Admitting: *Deleted

## 2017-10-25 DIAGNOSIS — R918 Other nonspecific abnormal finding of lung field: Secondary | ICD-10-CM

## 2017-10-26 ENCOUNTER — Ambulatory Visit
Admission: RE | Admit: 2017-10-26 | Discharge: 2017-10-26 | Disposition: A | Discharge status: Home / Self Care | Payer: BLUE CROSS/BLUE SHIELD | Source: Ambulatory Visit | Attending: Internal Medicine | Admitting: Internal Medicine

## 2017-10-26 ENCOUNTER — Other Ambulatory Visit: Payer: Self-pay

## 2017-10-26 ENCOUNTER — Encounter
Admission: RE | Admit: 2017-10-26 | Discharge: 2017-10-26 | Disposition: A | Discharge status: Home / Self Care | Payer: BLUE CROSS/BLUE SHIELD | Source: Ambulatory Visit | Attending: Internal Medicine | Admitting: Internal Medicine

## 2017-10-26 DIAGNOSIS — R918 Other nonspecific abnormal finding of lung field: Secondary | ICD-10-CM

## 2017-10-26 DIAGNOSIS — J432 Centrilobular emphysema: Secondary | ICD-10-CM | POA: Insufficient documentation

## 2017-10-26 LAB — BASIC METABOLIC PANEL
ANION GAP: 11 (ref 5–15)
BUN: 22 mg/dL — AB (ref 6–20)
CALCIUM: 9.8 mg/dL (ref 8.9–10.3)
CO2: 31 mmol/L (ref 22–32)
Chloride: 97 mmol/L — ABNORMAL LOW (ref 101–111)
Creatinine, Ser: 0.74 mg/dL (ref 0.61–1.24)
GFR calc Af Amer: >60 mL/min (ref >60)
GLUCOSE: 85 mg/dL (ref 65–99)
Potassium: 4 mmol/L (ref 3.5–5.1)
Sodium: 139 mmol/L (ref 135–145)

## 2017-10-26 LAB — CBC
HCT: 41.1 % (ref 40.0–52.0)
Hemoglobin: 13.9 g/dL (ref 13.0–18.0)
MCH: 30.7 pg (ref 26.0–34.0)
MCHC: 33.8 g/dL (ref 32.0–36.0)
MCV: 90.9 fL (ref 80.0–100.0)
PLATELETS: 266 10*3/uL (ref 150–440)
RBC: 4.53 MIL/uL (ref 4.40–5.90)
RDW: 13.3 % (ref 11.5–14.5)
WBC: 8.3 10*3/uL (ref 3.8–10.6)

## 2017-10-26 IMAGING — CT CT CHEST SUPER D W/O CM
2 of 4 series · 15 of 36 positions shown, 18 images · non-contrast
Comparison: PET-CT [DATE]

CLINICAL DATA: Bronchoscopy planning. Hypermetabolic upper lobe
pulmonary nodules.

EXAM:
CT CHEST WITHOUT CONTRAST
TECHNIQUE: Multidetector CT imaging of the chest was performed using thin slice
collimation for electromagnetic bronchoscopy planning purposes,
without intravenous contrast.

[Series 4: super d · axial · 0.74mm/px · z∈[-366,-41]mm · 12 of 446 slices shown, 15 images]
[im 20/446  mediastinal]
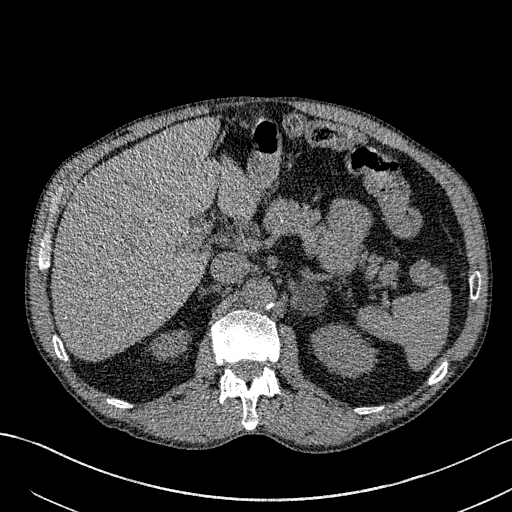
[im 20/446  lung]
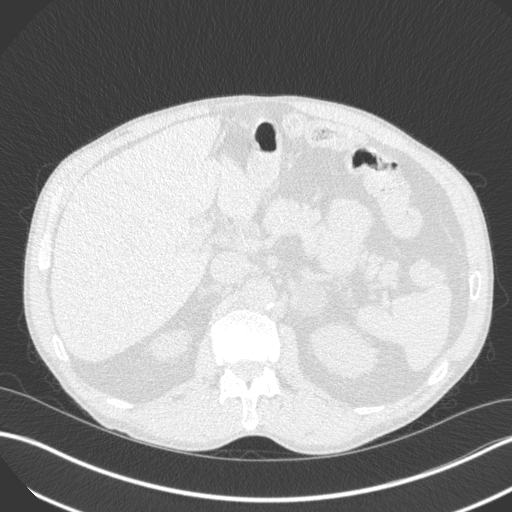
[im 59/446  lung]
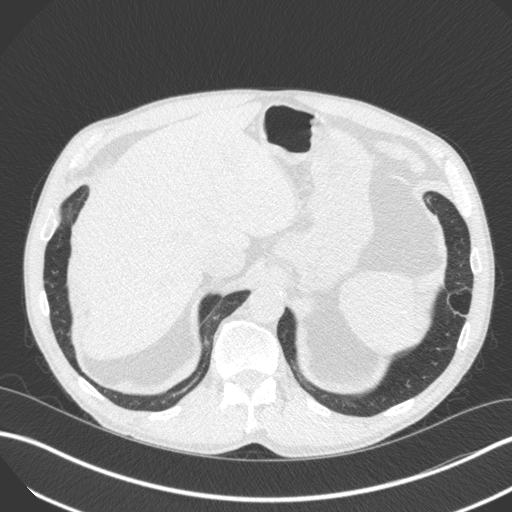
[im 97/446  lung]
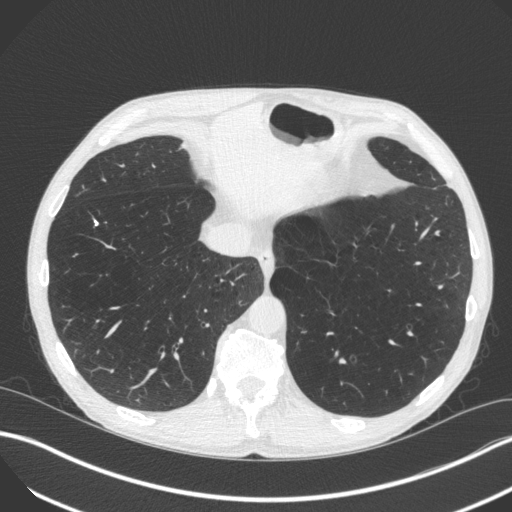
[im 136/446  lung]
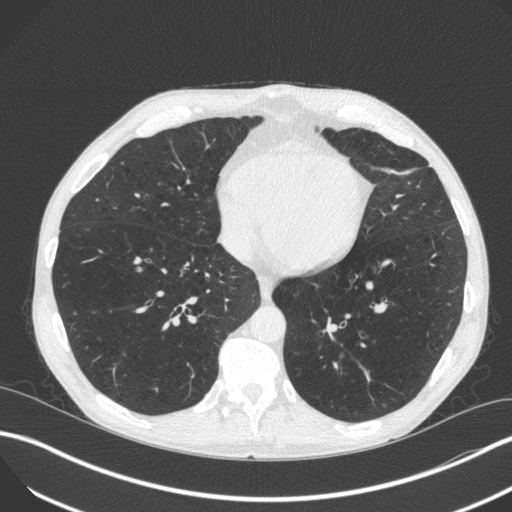
[im 175/446  mediastinal]
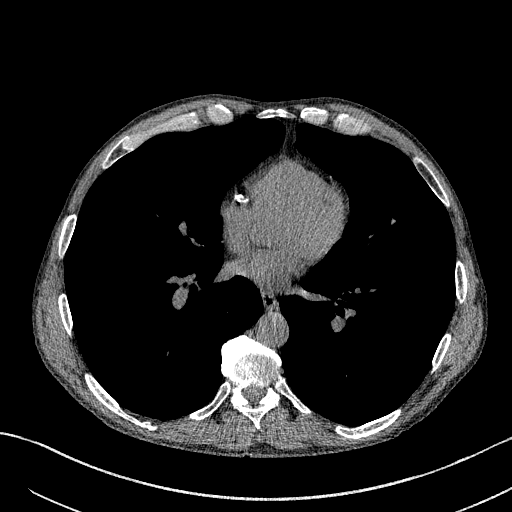
[im 175/446  lung]
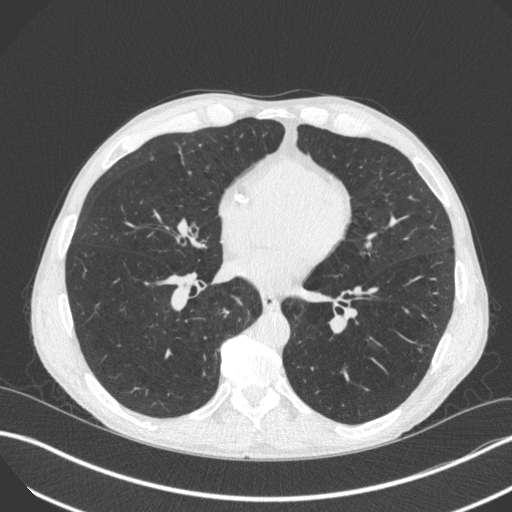
[im 213/446  lung]
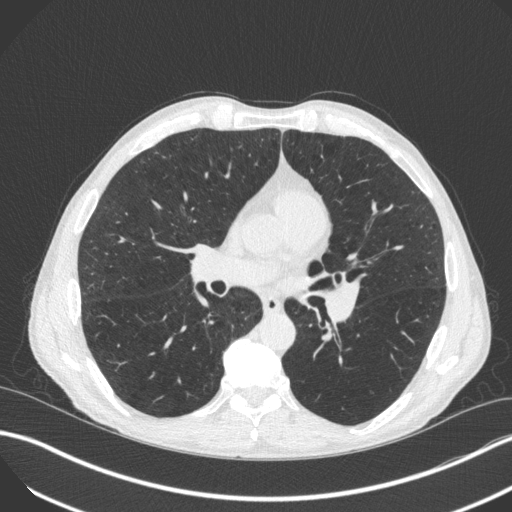
[im 233/446  lung]
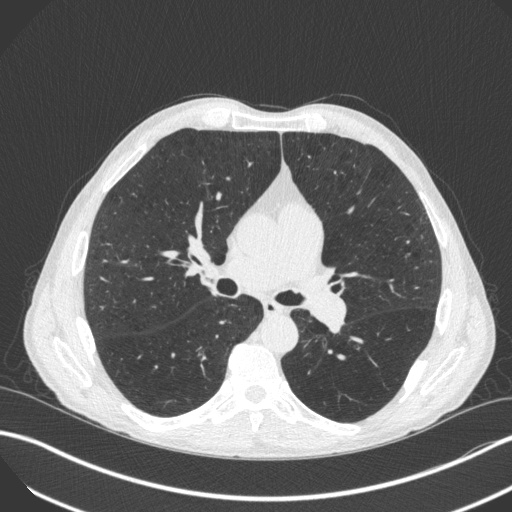
[im 271/446  lung]
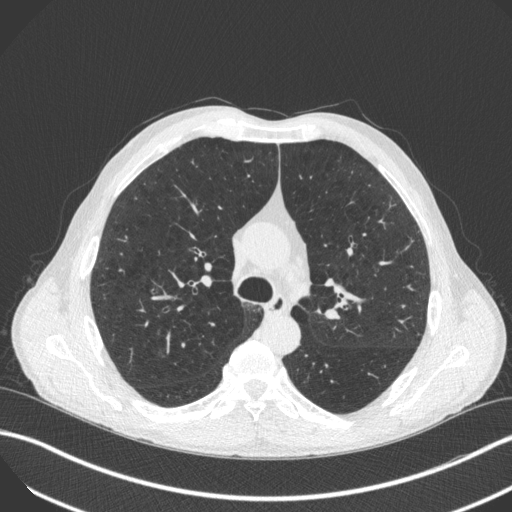
[im 310/446  mediastinal]
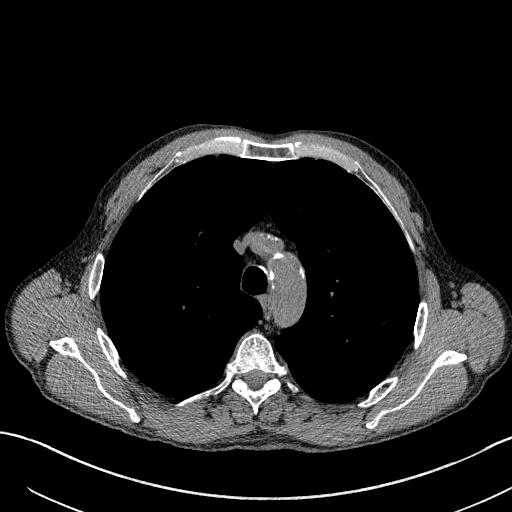
[im 310/446  lung]
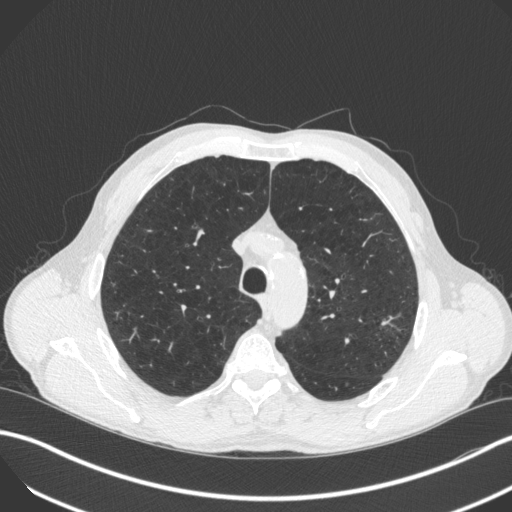
[im 349/446  lung]
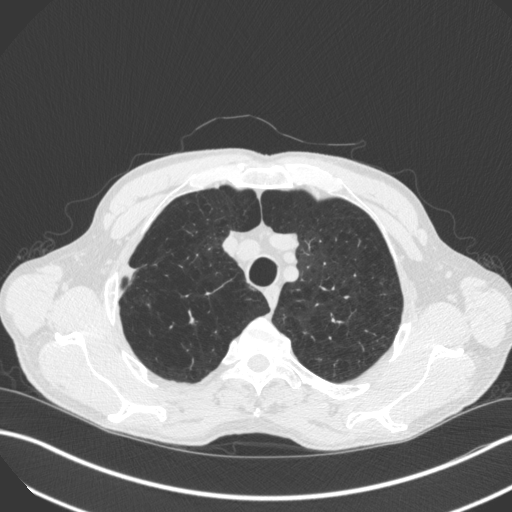
[im 387/446  lung]
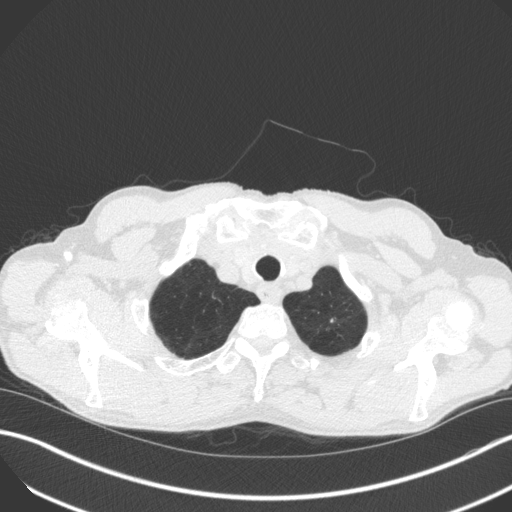
[im 426/446  lung]
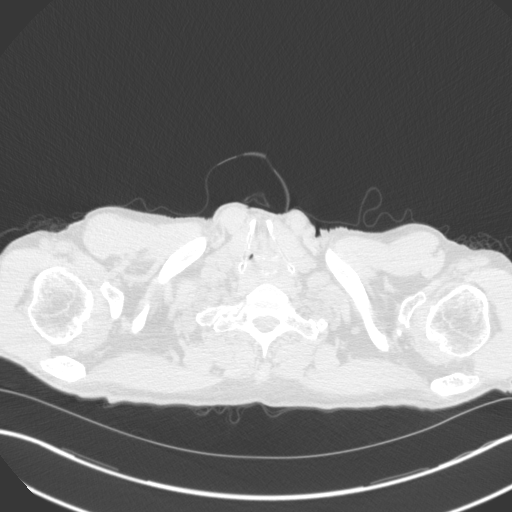

[Series 6: coronal · coronal · 0.71mm/px · 3 of 133 slices shown]
[im 27/133  lung]
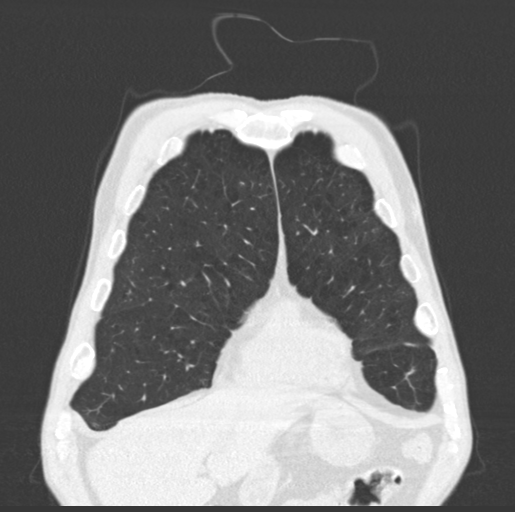
[im 53/133  lung]
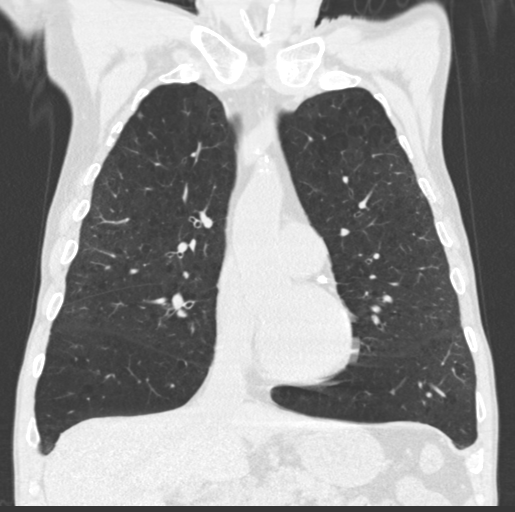
[im 80/133  lung]
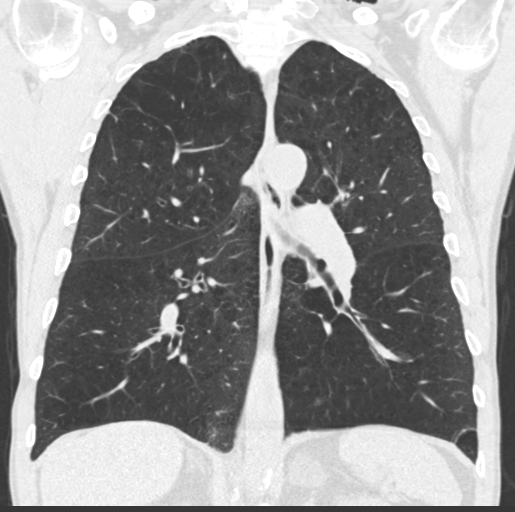

[15 of 36 positions shown; findings below may reference images not displayed]

FINDINGS: Cardiovascular: Coronary artery calcification and aortic
atherosclerotic calcification.

Mediastinum/Nodes: No axillary or supraclavicular adenopathy. Loose
body into the RIGHT shoulder joint.

No mediastinal adenopathy.  No hilar adenopathy.

Lungs/Pleura: 15 mm RIGHT upper lobe nodule with some parenchymal
retraction (image 19, series 3).

Similar 14 mm LEFT upper lobe nodule (image 20, series 3).

Lungs are hyperinflated.  Centrilobular emphysema

Upper Abdomen: Low-density enlargement of the LEFT adrenal gland
consistent benign adenoma.

Musculoskeletal: No aggressive osseous lesion
IMPRESSION: 1. Bilateral upper lobe solitary pulmonary nodules. Nodules
hypermetabolic on recent FDG PET scan.
2. No mediastinal lymphadenopathy.
3. Emphysema.

## 2017-10-26 NOTE — Patient Instructions (Signed)
Your procedure is scheduled on: 10/27/17 Report to [facility]. 2ND FLOOR MEDICAL MALL ENTRANCE. To find out your arrival time please call [phone] between 1PM - 3PM on 10/26/17.  Remember: Instructions that are not followed completely may result in serious medical risk, up to and including death, or upon the discretion of your surgeon and anesthesiologist your surgery may need to be rescheduled.    __X__ 1. Do not eat anything after midnight the night before your    procedure.  No gum chewing or hard candies.  You may drink clear   liquids up to 2 hours before you are scheduled to arrive at the   hospital for your procedure. Do not drink clear liquids within 2   hours of scheduled arrival to the hospital as this may lead to your   procedure being delayed or rescheduled.       Clear liquids include:   Water or Apple juice without pulp   Clear carbohydrate beverage such as Clearfast or Gatorade   Black coffee or Clear Tea (no milk, no creamer, do not add anything   to the coffee or tea)    Diabetics should only drink water   __X__ 2. No Alcohol for 24 hours before or after surgery.   ____ 3. Bring all medications with you on the day of surgery if instructed.    __X__ 4. Notify your doctor if there is any change in your medical condition     (cold, fever, infections).             __X___5. No smoking within 24 hours of your surgery.     Do not wear jewelry, make-up, hairpins, clips or nail polish.  Do not wear lotions, powders, or perfumes.   Do not shave 48 hours prior to surgery. Men may shave face and neck.  Do not bring valuables to the hospital.    [facility] is not responsible for any belongings or valuables.               Contacts, dentures or bridgework may not be worn into surgery.  Leave your suitcase in the car. After surgery it may be brought to your room.  For patients admitted to the hospital, discharge time is determined by your                treatment  team.   Patients discharged the day of surgery will not be allowed to drive home.   Please read over the following fact sheets that you were given:   MRSA Information   __X__ Take these medicines the morning of surgery with A SIP OF WATER:    1. LORAZEPAM IF NEEDED  2. ZOLOFT  3.   4.  5.  6.  ____ Fleet Enema (as directed)   ____ Use CHG Soap/SAGE wipes as directed  __X__ Use inhalers on the day of surgery/AND NEBULIZER  ____ Stop metformin 2 days prior to surgery    ____ Take 1/2 of usual insulin dose the night before surgery and none on the morning of surgery.   ____ Stop Coumadin/Plavix/aspirin on   __X__ Stop Anti-inflammatories such as Advil, Aleve, Ibuprofen, Motrin, Naproxen, Naprosyn, Goodies,powder, or aspirin products.  OK to take Tylenol.   __X__ Stop supplements, Vitamin E, Fish Oil until after surgery.    ____ Bring C-Pap to the hospital.

## 2017-10-27 ENCOUNTER — Ambulatory Visit
Admission: RE | Admit: 2017-10-27 | Discharge: 2017-10-27 | Disposition: A | Discharge status: Home / Self Care | Payer: BLUE CROSS/BLUE SHIELD | Source: Ambulatory Visit | Attending: Internal Medicine | Admitting: Internal Medicine

## 2017-10-27 ENCOUNTER — Ambulatory Visit: Payer: BLUE CROSS/BLUE SHIELD | Admitting: Anesthesiology

## 2017-10-27 ENCOUNTER — Ambulatory Visit: Payer: BLUE CROSS/BLUE SHIELD

## 2017-10-27 ENCOUNTER — Other Ambulatory Visit: Payer: Self-pay

## 2017-10-27 ENCOUNTER — Encounter
Admission: RE | Disposition: A | Discharge status: Home / Self Care | Payer: Self-pay | Source: Ambulatory Visit | Attending: Internal Medicine

## 2017-10-27 DIAGNOSIS — I252 Old myocardial infarction: Secondary | ICD-10-CM | POA: Diagnosis not present

## 2017-10-27 DIAGNOSIS — F329 Major depressive disorder, single episode, unspecified: Secondary | ICD-10-CM | POA: Insufficient documentation

## 2017-10-27 DIAGNOSIS — G4700 Insomnia, unspecified: Secondary | ICD-10-CM | POA: Diagnosis not present

## 2017-10-27 DIAGNOSIS — J9611 Chronic respiratory failure with hypoxia: Secondary | ICD-10-CM | POA: Insufficient documentation

## 2017-10-27 DIAGNOSIS — I10 Essential (primary) hypertension: Secondary | ICD-10-CM | POA: Insufficient documentation

## 2017-10-27 DIAGNOSIS — I7090 Unspecified atherosclerosis: Secondary | ICD-10-CM | POA: Insufficient documentation

## 2017-10-27 DIAGNOSIS — J449 Chronic obstructive pulmonary disease, unspecified: Secondary | ICD-10-CM | POA: Diagnosis not present

## 2017-10-27 DIAGNOSIS — J309 Allergic rhinitis, unspecified: Secondary | ICD-10-CM | POA: Diagnosis not present

## 2017-10-27 DIAGNOSIS — I2510 Atherosclerotic heart disease of native coronary artery without angina pectoris: Secondary | ICD-10-CM | POA: Diagnosis not present

## 2017-10-27 DIAGNOSIS — R51 Headache: Secondary | ICD-10-CM | POA: Insufficient documentation

## 2017-10-27 DIAGNOSIS — Z825 Family history of asthma and other chronic lower respiratory diseases: Secondary | ICD-10-CM | POA: Insufficient documentation

## 2017-10-27 DIAGNOSIS — Z811 Family history of alcohol abuse and dependence: Secondary | ICD-10-CM | POA: Insufficient documentation

## 2017-10-27 DIAGNOSIS — E785 Hyperlipidemia, unspecified: Secondary | ICD-10-CM | POA: Diagnosis not present

## 2017-10-27 DIAGNOSIS — E079 Disorder of thyroid, unspecified: Secondary | ICD-10-CM | POA: Insufficient documentation

## 2017-10-27 DIAGNOSIS — E119 Type 2 diabetes mellitus without complications: Secondary | ICD-10-CM | POA: Diagnosis not present

## 2017-10-27 DIAGNOSIS — K219 Gastro-esophageal reflux disease without esophagitis: Secondary | ICD-10-CM | POA: Insufficient documentation

## 2017-10-27 DIAGNOSIS — Z955 Presence of coronary angioplasty implant and graft: Secondary | ICD-10-CM | POA: Insufficient documentation

## 2017-10-27 DIAGNOSIS — Z8619 Personal history of other infectious and parasitic diseases: Secondary | ICD-10-CM | POA: Insufficient documentation

## 2017-10-27 DIAGNOSIS — M549 Dorsalgia, unspecified: Secondary | ICD-10-CM | POA: Insufficient documentation

## 2017-10-27 DIAGNOSIS — R911 Solitary pulmonary nodule: Secondary | ICD-10-CM | POA: Diagnosis not present

## 2017-10-27 DIAGNOSIS — F419 Anxiety disorder, unspecified: Secondary | ICD-10-CM | POA: Insufficient documentation

## 2017-10-27 DIAGNOSIS — F17210 Nicotine dependence, cigarettes, uncomplicated: Secondary | ICD-10-CM | POA: Insufficient documentation

## 2017-10-27 DIAGNOSIS — R918 Other nonspecific abnormal finding of lung field: Secondary | ICD-10-CM

## 2017-10-27 DIAGNOSIS — Z86010 Personal history of colonic polyps: Secondary | ICD-10-CM | POA: Diagnosis not present

## 2017-10-27 DIAGNOSIS — B353 Tinea pedis: Secondary | ICD-10-CM | POA: Insufficient documentation

## 2017-10-27 DIAGNOSIS — Z833 Family history of diabetes mellitus: Secondary | ICD-10-CM | POA: Insufficient documentation

## 2017-10-27 DIAGNOSIS — I429 Cardiomyopathy, unspecified: Secondary | ICD-10-CM | POA: Diagnosis not present

## 2017-10-27 DIAGNOSIS — Z888 Allergy status to other drugs, medicaments and biological substances status: Secondary | ICD-10-CM | POA: Insufficient documentation

## 2017-10-27 DIAGNOSIS — Z8711 Personal history of peptic ulcer disease: Secondary | ICD-10-CM | POA: Diagnosis not present

## 2017-10-27 DIAGNOSIS — Z8249 Family history of ischemic heart disease and other diseases of the circulatory system: Secondary | ICD-10-CM | POA: Insufficient documentation

## 2017-10-27 DIAGNOSIS — Z9981 Dependence on supplemental oxygen: Secondary | ICD-10-CM | POA: Insufficient documentation

## 2017-10-27 HISTORY — PX: ELECTROMAGNETIC NAVIGATION BROCHOSCOPY: SHX5369

## 2017-10-27 LAB — GLUCOSE, CAPILLARY
GLUCOSE-CAPILLARY: 86 mg/dL (ref 65–99)
GLUCOSE-CAPILLARY: 111 mg/dL — AB (ref 65–99)

## 2017-10-27 SURGERY — ELECTROMAGNETIC NAVIGATION BRONCHOSCOPY
Anesthesia: General

## 2017-10-27 MED ORDER — MIDAZOLAM HCL 5 MG/5ML IJ SOLN
INTRAMUSCULAR | Status: DC | PRN
  Administered 2017-10-27: 2 mg via INTRAVENOUS

## 2017-10-27 MED ORDER — EPHEDRINE SULFATE 50 MG/ML IJ SOLN
INTRAMUSCULAR | Status: DC | PRN
  Administered 2017-10-27 (×2): 5 mg via INTRAVENOUS

## 2017-10-27 MED ORDER — SUCCINYLCHOLINE CHLORIDE 20 MG/ML IJ SOLN
INTRAMUSCULAR | Status: AC
  Filled 2017-10-27: qty 1

## 2017-10-27 MED ORDER — ROCURONIUM BROMIDE 100 MG/10ML IV SOLN
INTRAVENOUS | Status: DC | PRN
  Administered 2017-10-27: 10 mg via INTRAVENOUS
  Administered 2017-10-27: 5 mg via INTRAVENOUS
  Administered 2017-10-27 (×2): 10 mg via INTRAVENOUS

## 2017-10-27 MED ORDER — SEVOFLURANE IN SOLN
RESPIRATORY_TRACT | Status: AC
Start: 2017-10-27 — End: ?
  Filled 2017-10-27: qty 250

## 2017-10-27 MED ORDER — DEXAMETHASONE SODIUM PHOSPHATE 10 MG/ML IJ SOLN
INTRAMUSCULAR | Status: DC | PRN
  Administered 2017-10-27: 10 mg via INTRAVENOUS

## 2017-10-27 MED ORDER — SUCCINYLCHOLINE CHLORIDE 20 MG/ML IJ SOLN
INTRAMUSCULAR | Status: DC | PRN
  Administered 2017-10-27: 120 mg via INTRAVENOUS

## 2017-10-27 MED ORDER — SUGAMMADEX SODIUM 200 MG/2ML IV SOLN
INTRAVENOUS | Status: DC | PRN
  Administered 2017-10-27: 130 mg via INTRAVENOUS

## 2017-10-27 MED ORDER — FAMOTIDINE 20 MG PO TABS
ORAL_TABLET | ORAL | Status: AC
  Filled 2017-10-27: qty 1

## 2017-10-27 MED ORDER — ONDANSETRON HCL 4 MG/2ML IJ SOLN
INTRAMUSCULAR | Status: DC | PRN
  Administered 2017-10-27: 4 mg via INTRAVENOUS

## 2017-10-27 MED ORDER — LIDOCAINE HCL (CARDIAC) 20 MG/ML IV SOLN
INTRAVENOUS | Status: DC | PRN
  Administered 2017-10-27: 60 mg via INTRAVENOUS

## 2017-10-27 MED ORDER — SEVOFLURANE IN SOLN
RESPIRATORY_TRACT | Status: AC
  Filled 2017-10-27: qty 250

## 2017-10-27 MED ORDER — MIDAZOLAM HCL 2 MG/2ML IJ SOLN
INTRAMUSCULAR | Status: AC
  Filled 2017-10-27: qty 2

## 2017-10-27 MED ORDER — ONDANSETRON HCL 4 MG/2ML IJ SOLN
INTRAMUSCULAR | Status: AC
  Filled 2017-10-27: qty 2

## 2017-10-27 MED ORDER — DEXAMETHASONE SODIUM PHOSPHATE 10 MG/ML IJ SOLN
INTRAMUSCULAR | Status: AC
  Filled 2017-10-27: qty 1

## 2017-10-27 MED ORDER — FENTANYL CITRATE (PF) 100 MCG/2ML IJ SOLN
INTRAMUSCULAR | Status: DC | PRN
  Administered 2017-10-27: 100 ug via INTRAVENOUS

## 2017-10-27 MED ORDER — PROPOFOL 10 MG/ML IV BOLUS
INTRAVENOUS | Status: AC
  Filled 2017-10-27: qty 20

## 2017-10-27 MED ORDER — ROCURONIUM BROMIDE 50 MG/5ML IV SOLN
INTRAVENOUS | Status: AC
  Filled 2017-10-27: qty 1

## 2017-10-27 MED ORDER — PROPOFOL 10 MG/ML IV BOLUS
INTRAVENOUS | Status: DC | PRN
  Administered 2017-10-27: 160 mg via INTRAVENOUS

## 2017-10-27 MED ORDER — FAMOTIDINE 20 MG PO TABS
20.0000 mg | ORAL_TABLET | Freq: Once | ORAL | Status: AC
  Administered 2017-10-27: 20 mg via ORAL

## 2017-10-27 MED ORDER — SODIUM CHLORIDE 0.9 % IV SOLN
INTRAVENOUS | Status: DC
  Administered 2017-10-27 (×2): via INTRAVENOUS

## 2017-10-27 MED ORDER — FENTANYL CITRATE (PF) 100 MCG/2ML IJ SOLN
INTRAMUSCULAR | Status: AC
  Filled 2017-10-27: qty 2

## 2017-10-27 NOTE — Discharge Instructions (Signed)

## 2017-10-27 NOTE — Interval H&P Note (Signed)
History and Physical Interval Note:  10/27/2017 12:38 PM  Leslie Sims  has presented today for surgery, with the diagnosis of LUNG MASS  The various methods of treatment have been discussed with the patient and family. After consideration of risks, benefits and other options for treatment, the patient has consented to  Procedure(s): ELECTROMAGNETIC NAVIGATION BRONCHOSCOPY (N/A) as a surgical intervention .  The patient's history has been reviewed, patient examined, no change in status, stable for surgery.  I have reviewed the patient's chart and labs.  Questions were answered to the patient's satisfaction.     Flora Lipps

## 2017-10-27 NOTE — Transfer of Care (Signed)
Immediate Anesthesia Transfer of Care Note  Patient: Leslie Sims  Procedure(s) Performed: ELECTROMAGNETIC NAVIGATION BRONCHOSCOPY (N/A )  Patient Location: PACU  Anesthesia Type:General  Level of Consciousness: sedated  Airway & Oxygen Therapy: Patient Spontanous Breathing and Patient connected to face mask oxygen  Post-op Assessment: Report given to RN and Post -op Vital signs reviewed and stable  Post vital signs: Reviewed and stable  Last Vitals:  Vitals:   10/27/17 1130 10/27/17 1455  BP: 136/72 127/67  Pulse: 87 70  Resp: 20 14  Temp: (!) 36.2 C (!) 36.3 C  SpO2: 96% 100%    Last Pain:  Vitals:   10/27/17 1130  TempSrc: Temporal         Complications: No apparent anesthesia complications

## 2017-10-27 NOTE — OR Nursing (Signed)
Dr Mortimer Fries and Jacqulyn Ducking RT both made aware that patient smoked 6 cigarettes this am and had aspirin 81 mg 10/26/17 in the am.

## 2017-10-27 NOTE — Anesthesia Procedure Notes (Signed)
Procedure Name: Intubation Date/Time: 10/27/2017 1:23 PM Performed by: Dionne Bucy, CRNA Pre-anesthesia Checklist: Patient identified, Patient being monitored, Timeout performed, Emergency Drugs available and Suction available Patient Re-evaluated:Patient Re-evaluated prior to induction Oxygen Delivery Method: Circle system utilized Preoxygenation: Pre-oxygenation with 100% oxygen Induction Type: IV induction Ventilation: Mask ventilation without difficulty Laryngoscope Size: Mac and 4 Grade View: Grade II Tube type: Oral Tube size: 8.5 mm Number of attempts: 1 Airway Equipment and Method: Stylet Placement Confirmation: ETT inserted through vocal cords under direct vision,  positive ETCO2 and breath sounds checked- equal and bilateral Secured at: 23 cm Tube secured with: Tape Dental Injury: Teeth and Oropharynx as per pre-operative assessment

## 2017-10-27 NOTE — Anesthesia Preprocedure Evaluation (Signed)
Anesthesia Evaluation  Patient identified by MRN, date of birth, ID band Patient awake    Reviewed: Allergy & Precautions, NPO status , Patient's Chart, lab work & pertinent test results, reviewed documented beta blocker date and time   History of Anesthesia Complications Negative for: history of anesthetic complications  Airway Mallampati: II  TM Distance: >3 FB     Dental  (+) Chipped, Partial Upper, Dental Advidsory Given, Poor Dentition   Pulmonary shortness of breath and with exertion, neg sleep apnea, COPD,  COPD inhaler and oxygen dependent, neg recent URI, Current Smoker,           Cardiovascular Exercise Tolerance: Poor hypertension, Pt. on medications (-) angina+ CAD, + Past MI and + Cardiac Stents  (-) CABG (-) dysrhythmias (-) Valvular Problems/Murmurs     Neuro/Psych  Headaches, neg Seizures PSYCHIATRIC DISORDERS    GI/Hepatic Neg liver ROS, PUD, GERD  ,  Endo/Other  diabetes (Pre-DM)  Renal/GU negative Renal ROS     Musculoskeletal   Abdominal   Peds  Hematology negative hematology ROS (+)   Anesthesia Other Findings Past Medical History: No date: Allergic rhinitis No date: Anxiety No date: Atherosclerosis No date: Back pain No date: Bronchitis     Comment:  09-14-11 03/24/2010: CARDIOMYOPATHY     Comment:  did not tolerate Toprol and Lisinopril.  No date: Chest pain No date: Chronic airway obstruction, not elsewhere classified 08/2012: Colon polyps     Comment:  colonoscopy; multiple colon polyps; repeat colonoscopy               in one year. No date: COPD (chronic obstructive pulmonary disease) ([place]) 11/2009: Coronary artery disease     Comment:  Anterior MI with late presentation 11/2009. Cath showed               a 100% proximal LAD occlusion, 99% mid RCA. Had a PCI and              2 DES to both LAD and RCA. EF was 35%. Nuclear stress               test 05/13: prior anterior/inferior  infarcts without               ischemic, EF 43%, cardiac cath in 02/14: Widely patent               stents with no other obstructive disease. EF 40%  No date: Depression No date: Diabetes mellitus without complication (HCC) No date: GERD (gastroesophageal reflux disease) No date: ZSWFUXNA(355.7) No date: Helicobacter pylori (H. pylori) No date: Hemorrhoids No date: Hernia     Comment:  inguinal No date: Hyperlipidemia No date: Hypertension No date: Insomnia No date: MI (myocardial infarction) ([place]) No date: Myalgia No date: Peptic ulcer No date: Shortness of breath dyspnea 2000: Status post dilation of esophageal narrowing No date: Thyroid dysfunction No date: Tinea pedis No date: Wears dentures     Comment:  partial upper   Reproductive/Obstetrics                             Anesthesia Physical  Anesthesia Plan  ASA: III  Anesthesia Plan: General   Post-op Pain Management:    Induction: Intravenous  PONV Risk Score and Plan: 1 and Ondansetron and Dexamethasone  Airway Management Planned: Oral ETT  Additional Equipment:   Intra-op Plan:   Post-operative Plan: Extubation in OR  Informed Consent: I  have reviewed the patients History and Physical, chart, labs and discussed the procedure including the risks, benefits and alternatives for the proposed anesthesia with the patient or authorized representative who has indicated his/her understanding and acceptance.     Plan Discussed with: CRNA  Anesthesia Plan Comments:         Anesthesia Quick Evaluation

## 2017-10-27 NOTE — Op Note (Signed)
Electromagnetic Navigation Bronchoscopy: Indication: RT and LT lung nodule  Preoperative Diagnosis:RT and LT lung nodule Consent: Verbal/Written  The Risks and Benefits of the procedure explained to patient/family prior to start of procedure and I have discussed the risk for acute bleeding, increased chance of infection, increased chance of respiratory failure and cardiac arrest and death.  I have also explained to avoid all types of NSAIDs to decrease chance of bleeding, and to avoid food and drinks the midnight prior to procedure.  The procedure consists of a video camera with a light source to be placed and inserted  into the lungs to  look for abnormal tissue and to obtain tissue samples by using needle and biopsy tools.  The patient/family understand the risks and benefits and have agreed to proceed with procedure.   Hand washing performed prior to starting the procedure.   Type of Anesthesia: see Anesthesiology records .   Procedure Performed:  Virtual Bronchoscopy with Multi-planar Image analysis, 3-D reconstruction of coronal, sagittal and multi-planar images for the purposes of planning real-time bronchoscopy using the i[employer] Electromagnetic Navigation Bronchoscopy System (superDimension).  Description of Procedure: After obtaining informed consent from the patient, the above sedative and anesthetic measures were carried out, flexible fiberoptic bronchoscope was inserted via Endotracheal tube after patient was intubated by CNA/Anesthesiologist.   The virtual camera was then placed into the central portion of the trachea. The trachea itself was inspected.  The main carina, right and left midstem bronchus and all the segmental and subsegmental airways by virtual bronchoscopy were inspected. The camera was directed to standard registration points at the following centers: main carina, right upper lobe bronchus, right lower lobe bronchus, right middle lobe bronchus, left upper lobe  bronchus, and the left lower lobe bronchus. This data was transferred to the i-[employer] ENB system for real-time bronchoscopy.   The scope was then navigated to the RUL and LUL nodules  for tissue sampling  Specimans Obtained: RUL nodule  Transbronchial Fine Needle Aspirations 21G times:7  Transbronchial Forceps Biopsy times:6  Transbronchial Triple Needle Brush:1   Specimans Obtained: LUL nodule  Transbronchial Fine Needle Aspirations 21G times:3  Transbronchial Forceps Biopsy times:3  Transbronchial Triple Needle Brush:1    Fluoroscopy:  Fluoroscopy was utilized during the course of this procedure to assure that biopsies were taken in a safe manner under fluoroscopic guidance with spot films required.  approx 11 minutes  Complications:None  Estimated Blood Loss: minimal approx 1cc  Monitoring:  The patient was monitored with continuous oximetry and received supplemental nasal cannula oxygen throughout the procedure. In addition, serial blood pressure measurements and continuous electrocardiography showed these physiologic parameters to remain tolerable throughout the procedure.   Assessment and Plan/Additional Comments: Follow up Pathology Reports    Corrin Parker, M.D.  [facility]  Medical Director [place] Director [facility] Cardio-Pulmonary Department

## 2017-10-27 NOTE — Anesthesia Post-op Follow-up Note (Signed)
Anesthesia QCDR form completed.        

## 2017-10-28 ENCOUNTER — Encounter: Payer: Self-pay | Admitting: Internal Medicine

## 2017-10-28 NOTE — Anesthesia Postprocedure Evaluation (Signed)
Anesthesia Post Note  Patient: Leslie Sims  Procedure(s) Performed: ELECTROMAGNETIC NAVIGATION BRONCHOSCOPY (N/A )  Patient location during evaluation: PACU Anesthesia Type: General Level of consciousness: awake and alert Pain management: pain level controlled Vital Signs Assessment: post-procedure vital signs reviewed and stable Respiratory status: spontaneous breathing, nonlabored ventilation, respiratory function stable and patient connected to nasal cannula oxygen Cardiovascular status: blood pressure returned to baseline and stable Postop Assessment: no apparent nausea or vomiting Anesthetic complications: no     Last Vitals:  Vitals:   10/27/17 1540 10/27/17 1546  BP: 123/69 125/68  Pulse: 85   Resp: 16   Temp: (!) 36.2 C   SpO2: 95% 99%    Last Pain:  Vitals:   10/28/17 0836  TempSrc:   PainSc: 0-No pain                 Martha Clan

## 2017-10-31 ENCOUNTER — Ambulatory Visit
Admission: RE | Admit: 2017-10-31 | Discharge: 2017-10-31 | Disposition: A | Discharge status: Home / Self Care | Payer: BLUE CROSS/BLUE SHIELD | Source: Ambulatory Visit | Attending: Pulmonary Disease | Admitting: Pulmonary Disease

## 2017-10-31 ENCOUNTER — Other Ambulatory Visit: Payer: Self-pay | Admitting: *Deleted

## 2017-10-31 ENCOUNTER — Ambulatory Visit: Payer: BLUE CROSS/BLUE SHIELD | Admitting: Family Medicine

## 2017-10-31 ENCOUNTER — Telehealth: Payer: Self-pay | Admitting: Internal Medicine

## 2017-10-31 DIAGNOSIS — R918 Other nonspecific abnormal finding of lung field: Secondary | ICD-10-CM | POA: Insufficient documentation

## 2017-10-31 DIAGNOSIS — R0602 Shortness of breath: Secondary | ICD-10-CM | POA: Insufficient documentation

## 2017-10-31 DIAGNOSIS — J449 Chronic obstructive pulmonary disease, unspecified: Secondary | ICD-10-CM | POA: Insufficient documentation

## 2017-10-31 DIAGNOSIS — R079 Chest pain, unspecified: Secondary | ICD-10-CM | POA: Insufficient documentation

## 2017-10-31 LAB — CYTOLOGY - NON PAP

## 2017-10-31 LAB — SURGICAL PATHOLOGY

## 2017-10-31 IMAGING — CR DG CHEST 2V
1 series · 2 of 2 positions shown · non-contrast
Comparison: CT [DATE], chest x-ray [DATE]

CLINICAL DATA: Chest pain and short of breath

EXAM:
CHEST  2 VIEW

[Series 1: dg chest 2 view · 0.14mm/px · 2 of 2 slices shown]
[im 1/2]
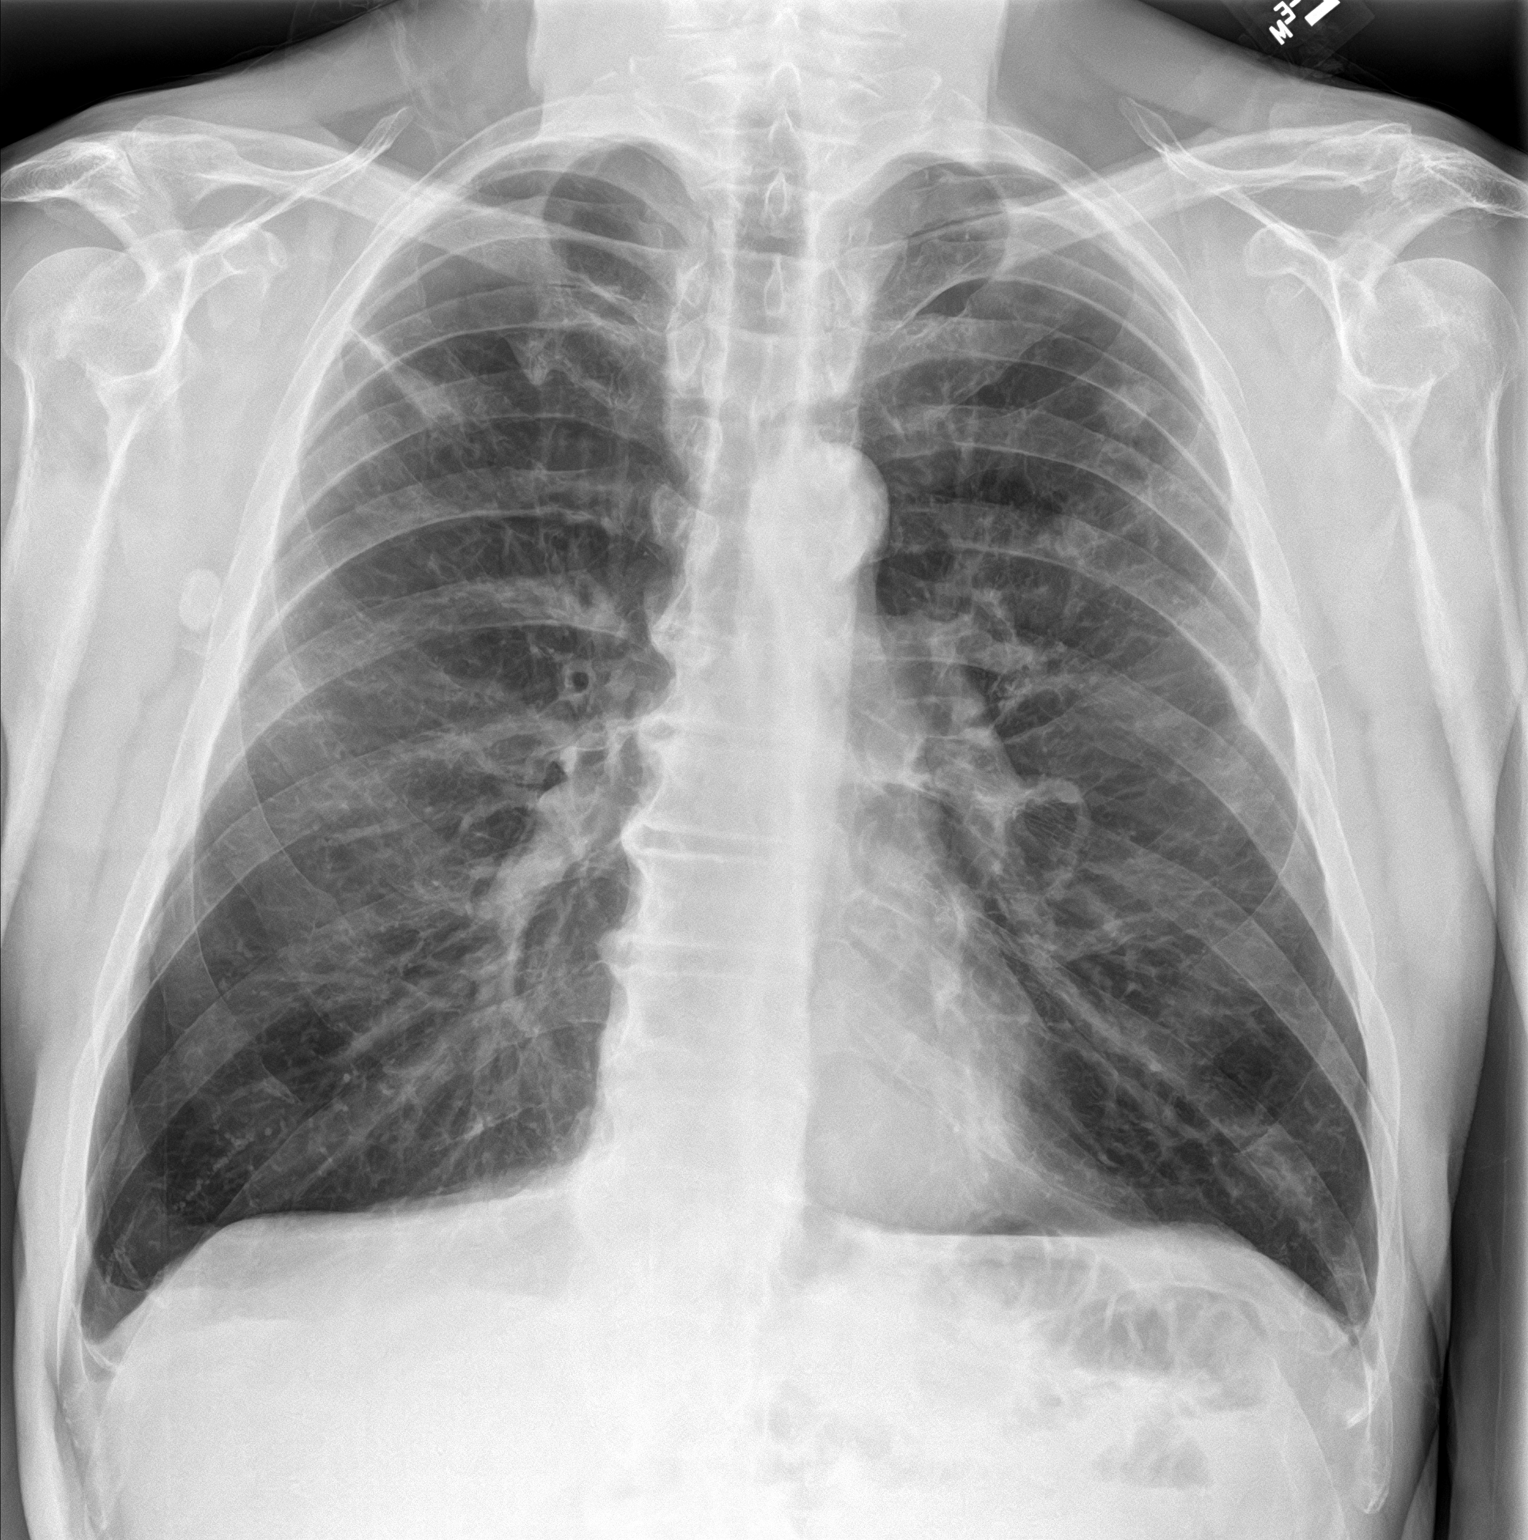
[im 2/2]
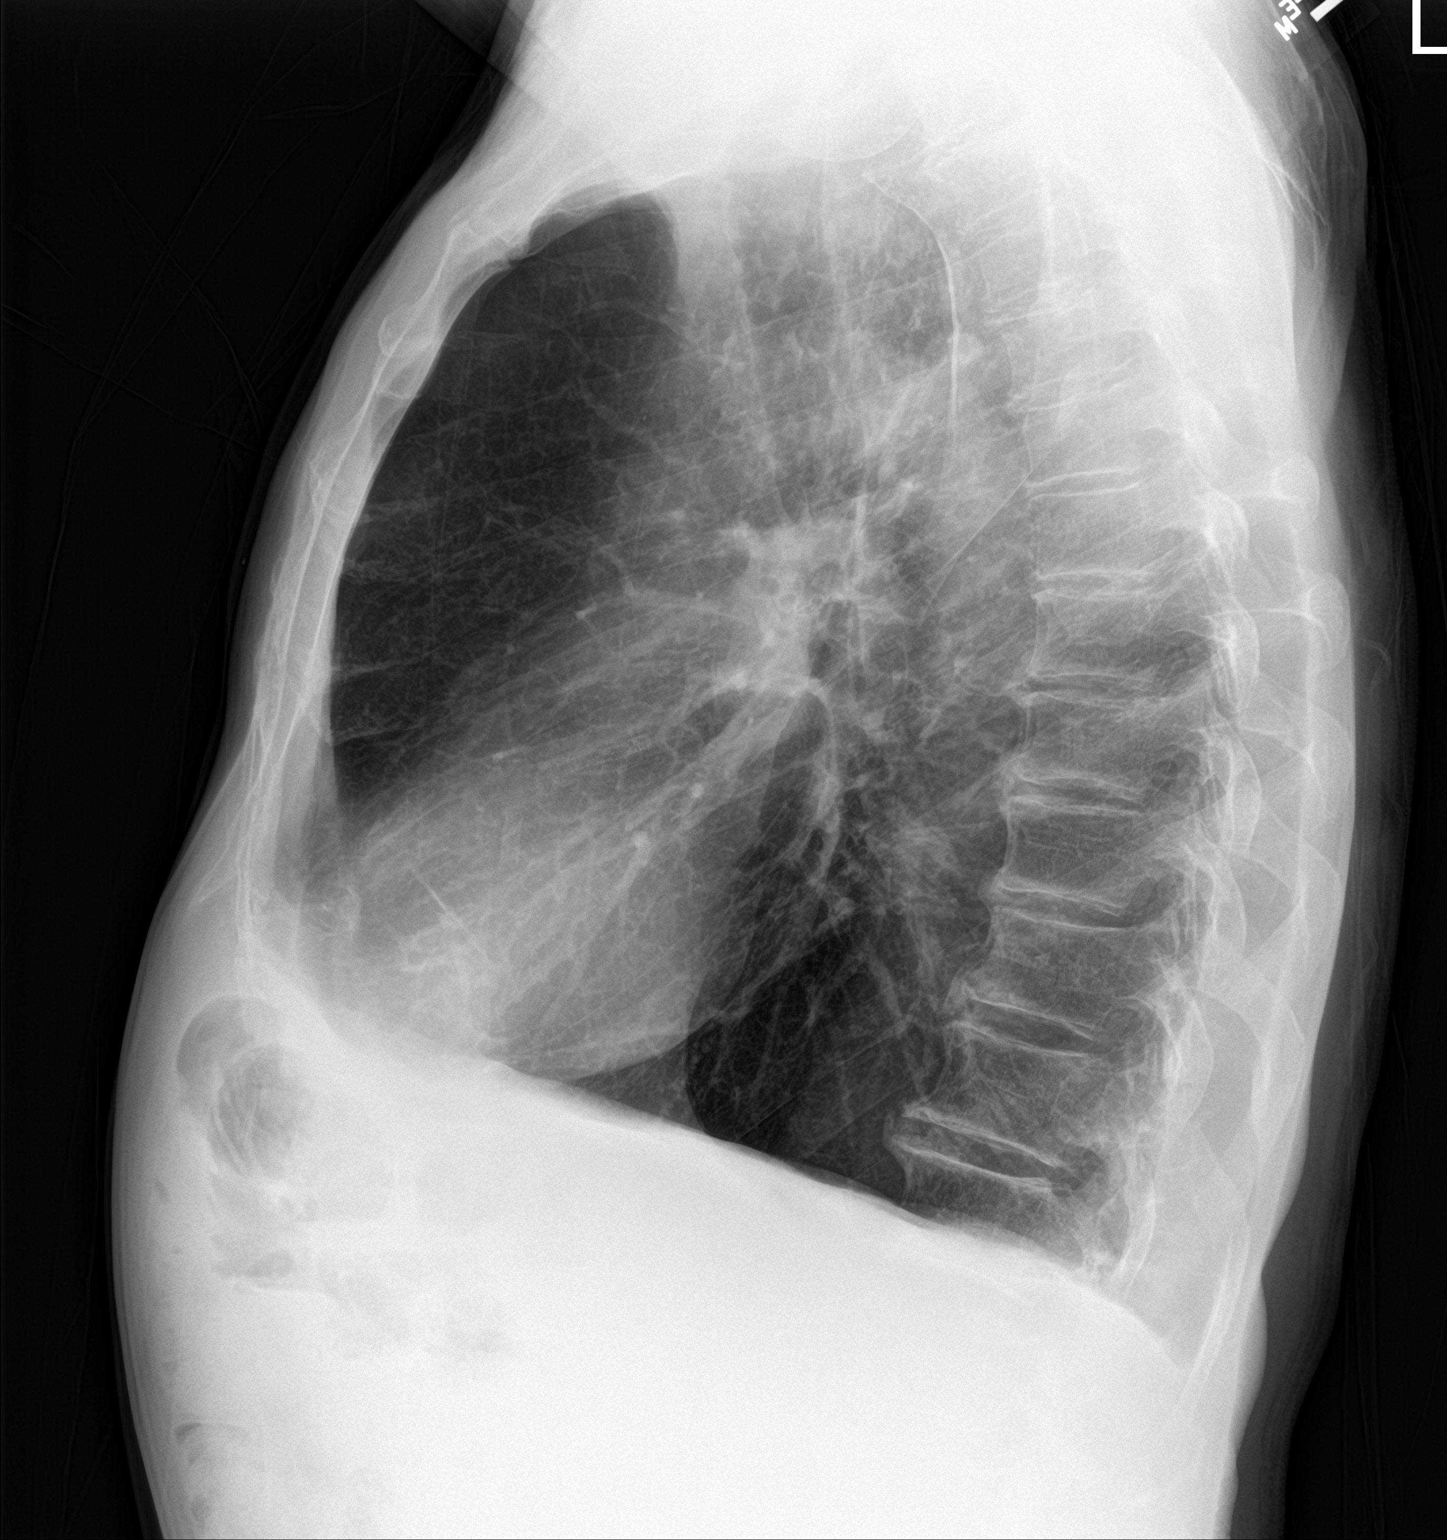

[2 of 2 positions shown; findings below may reference images not displayed]

FINDINGS: COPD with pulmonary hyperinflation. Bilateral upper lobe nodules
unchanged from recent CT. Suspicion for neoplasm. Negative for heart
failure. Negative for pneumonia or effusion. Chronic left rib
fractures.
IMPRESSION: Advanced COPD. Bilateral upper lobe lung nodules, suspicious for
neoplasm unchanged from recent CT.

## 2017-10-31 NOTE — Telephone Encounter (Signed)
Pt calling stating since the biopsy on Thursday  He's been more SOB, along with some burning on left side on chest  States it is a constant pain   Would like to know if this is normal   Please advise.

## 2017-10-31 NOTE — Telephone Encounter (Signed)
Kasa did an ENB on 10/27/17. Please advise on message below.

## 2017-10-31 NOTE — Telephone Encounter (Signed)
Per DS, pt needs to go and get a CXR. Pt informed to go and get a CXR. Order placed. Nothing further needed.

## 2017-11-01 ENCOUNTER — Ambulatory Visit (HOSPITAL_COMMUNITY): Payer: BLUE CROSS/BLUE SHIELD

## 2017-11-01 DIAGNOSIS — R918 Other nonspecific abnormal finding of lung field: Secondary | ICD-10-CM

## 2017-11-01 DIAGNOSIS — R0602 Shortness of breath: Secondary | ICD-10-CM | POA: Diagnosis not present

## 2017-11-01 DIAGNOSIS — J449 Chronic obstructive pulmonary disease, unspecified: Secondary | ICD-10-CM

## 2017-11-01 MED ORDER — ALBUTEROL SULFATE (2.5 MG/3ML) 0.083% IN NEBU
2.5000 mg | INHALATION_SOLUTION | Freq: Once | RESPIRATORY_TRACT | Status: AC
  Administered 2017-11-01: 2.5 mg via RESPIRATORY_TRACT
  Filled 2017-11-01: qty 3

## 2017-11-02 NOTE — Progress Notes (Signed)
[facility]  [phone] [phone]) [phone]  ID: [employer] OB: 08/12/1951  MR#: [id number]  XTG#:[id number]  Patient Care Team: [name], MD as PCP - General (Family Medicine) [name], MD (Family Medicine) Wellington Hampshire, MD as Consulting Physician (Cardiology) Telford Nab, RN as Registered Nurse  CHIEF COMPLAINT: Bilateral pulmonary nodules.  INTERVAL HISTORY: Patient returns to clinic today for further evaluation and discussion of his biopsy results.  He continues to be anxious, but otherwise feels well.  He continues to smoke heavily.  He has no neurologic complaints.  He has a good appetite and denies weight loss.  He denies any recent fevers or illnesses. He denies any chest pain, shortness of breath, cough, or hemoptysis.  He has no nausea, vomiting, constipation, or diarrhea.  No urinary complaints.  Patient offers no further specific complaints today.  REVIEW OF SYSTEMS:   Review of Systems  Constitutional: Negative.  Negative for fever, malaise/fatigue and weight loss.  Respiratory: Negative.  Negative for cough, hemoptysis and shortness of breath.   Cardiovascular: Negative.  Negative for chest pain and leg swelling.  Gastrointestinal: Negative.  Negative for abdominal pain, blood in stool and melena.  Genitourinary: Negative.   Musculoskeletal: Negative.  Negative for joint pain.  Skin: Negative.  Negative for rash.  Neurological: Negative.  Negative for sensory change.  Psychiatric/Behavioral: The patient is nervous/anxious.     As per HPI. Otherwise, a complete review of systems is negative.  PAST MEDICAL HISTORY: Past Medical History:  Diagnosis Date  . Allergic rhinitis   . Anxiety   . Atherosclerosis   . Back pain   . Bronchitis    09-14-11  . CARDIOMYOPATHY 03/24/2010   did not tolerate Toprol and Lisinopril.   . Chest pain   . Chronic airway obstruction, not elsewhere classified   . Colon polyps  08/2012   colonoscopy; multiple colon polyps; repeat colonoscopy in one year.  [name] COPD (chronic obstructive pulmonary disease) ([place])   . Coronary artery disease 11/2009   Anterior MI with late presentation 11/2009. Cath showed a 100% proximal LAD occlusion, 99% mid RCA. Had a PCI and 2 DES to both LAD and RCA. EF was 35%. Nuclear stress test 05/13: prior anterior/inferior infarcts without ischemic, EF 43%, cardiac cath in 02/14: Widely patent stents with no other obstructive disease. EF 40%   . Depression   . Diabetes mellitus without complication ([place])   . GERD (gastroesophageal reflux disease)   . Headache(784.0)   . Helicobacter pylori (H. pylori)   . Hemorrhoids   . Hernia    inguinal  . Hyperlipidemia   . Hypertension   . Insomnia   . MI (myocardial infarction) ([place])   . Myalgia   . Peptic ulcer   . Shortness of breath dyspnea   . Status post dilation of esophageal narrowing 2000  . Thyroid dysfunction   . Tinea pedis   . Wears dentures    partial upper    PAST SURGICAL HISTORY: Past Surgical History:  Procedure Laterality Date  . Admission  12/20/2012   COPD exacerbation.  [place].  [name] CARDIAC CATHETERIZATION    . CARDIAC CATHETERIZATION  02/05/13   [facility]  . CARDIAC CATHETERIZATION  10/14   [facility] : patent stents with no change in anatomy. EF: 40$  . CARDIAC CATHETERIZATION Left 11/12/2016   Procedure: Left Heart Cath and Coronary Angiography;  Surgeon: Wellington Hampshire, MD;  Location: [facility];  Service: Cardiovascular;  Laterality: Left;  . COLONOSCOPY    . COLONOSCOPY WITH PROPOFOL N/A 05/18/2016   Procedure: COLONOSCOPY WITH PROPOFOL;  Surgeon: Lucilla Lame, MD;  Location: [facility] ENDOSCOPY;  Service: Endoscopy;  Laterality: N/A;  . CORONARY ANGIOPLASTY WITH STENT PLACEMENT  09/2009   LAD 3.0 X23 mm Xience DES, RCA: 4.0 X 15 mm Xience DES  . ELECTROMAGNETIC NAVIGATION BROCHOSCOPY N/A 10/27/2017   Procedure: ELECTROMAGNETIC NAVIGATION BRONCHOSCOPY;  Surgeon: Flora Lipps, MD;  Location: [facility] ORS;  Service: Cardiopulmonary;  Laterality: N/A;  . ESOPHAGEAL DILATION    . ESOPHAGOGASTRODUODENOSCOPY  2008  . ESOPHAGOGASTRODUODENOSCOPY  08/20/2012  . HERNIA REPAIR  07/20/2012   L inguinal hernia repair  . PENILE PROSTHESIS IMPLANT      FAMILY HISTORY: Family History  Problem Relation Age of Onset  . Heart attack Brother        Brother #1  . Diabetes Brother   . Hypertension Brother        #3  . Coronary artery disease Father 30       deceased  . Heart attack Father   . Diabetes Father   . Heart disease Father   . COPD Mother 29       deceased  . Alcohol abuse Sister        polysubstance abuse  . COPD Sister   . Lung cancer Sister   . Alcohol abuse Sister        polysubstance abuse  . Penile cancer Brother   . Diabetes Brother     ADVANCED DIRECTIVES (Y/N):  N  HEALTH MAINTENANCE: Social History   Tobacco Use  . Smoking status: Current Every Day Smoker    Packs/day: 1.50    Years: 42.00    Pack years: 63.00    Types: Cigarettes  . Smokeless tobacco: Never Used  . Tobacco comment: smoking 15-18 cigarettes daily  Substance Use Topics  . Alcohol use: No    Alcohol/week: 0.0 oz  . Drug use: No     Colonoscopy:  PAP:  Bone density:  Lipid panel:  Allergies  Allergen Reactions  . Effexor Xr [Venlafaxine Hcl Er]   . Wellbutrin [Bupropion] Other (See Comments)    Unknown/"made me feel real funny"    Current Outpatient Medications  Medication Sig Dispense Refill  . albuterol (PROVENTIL) (2.5 MG/3ML) 0.083% nebulizer solution USE 1 VIAL (3 ML) BY NEBULIZATION EVERY 6 HOURS AS NEEDED FOR WHEEZING OR SHORTNESS OF BREATH 1080 mL 3  . albuterol (VENTOLIN HFA) 108 (90 Base) MCG/ACT inhaler USE 2 INHALATIONS EVERY 6 HOURS AS NEEDED FOR WHEEZING 48 g 3  . aspirin 81 MG tablet Take 81 mg by mouth daily.      [name] atorvastatin (LIPITOR) 40 MG tablet Take 1 tablet (40 mg total) by mouth daily. 90 tablet 3  . azelastine (ASTELIN) 0.1 %  nasal spray Place 2 sprays into both nostrils 2 (two) times daily. Use in each nostril as directed 30 mL 12  . fluticasone (FLONASE) 50 MCG/ACT nasal spray Place 1 spray into both nostrils 2 (two) times daily. 48 g 3  . Fluticasone-Salmeterol (ADVAIR DISKUS) 500-50 MCG/DOSE AEPB Inhale 1 puff into the lungs 2 (two) times daily. 180 each 3  . ipratropium-albuterol (DUONEB) 0.5-2.5 (3) MG/3ML SOLN INHALE 1 VIAL BY NEBULIZER EVERY 6 HOURS AS NEEDED 270 mL 3  . ketoconazole (NIZORAL) 2 % cream Apply 1 application topically 2 (two) times daily. 60 g 0  . LORazepam (ATIVAN) 0.5 MG tablet Take 1 tablet (0.5  mg total) by mouth every 6 (six) hours as needed. for anxiety 120 tablet 2  . losartan (COZAAR) 25 MG tablet Take 1 tablet (25 mg total) by mouth daily. 90 tablet 3  . mirtazapine (REMERON) 15 MG tablet Take 1 tablet (15 mg total) by mouth at bedtime. 30 tablet 5  . Multiple Vitamin (MULTIVITAMIN) capsule Take 1 capsule by mouth daily.    . nitroGLYCERIN (NITROSTAT) 0.4 MG SL tablet Place 1 tablet (0.4 mg total) under the tongue every 5 (five) minutes as needed. 25 tablet 3  . nystatin (MYCOSTATIN) 100000 UNIT/ML suspension Take 5 mLs (500,000 Units total) by mouth 4 (four) times daily. 120 mL 0  . sertraline (ZOLOFT) 50 MG tablet Take 2-3 tablets (100-150 mg total) by mouth daily. 270 tablet 1  . sucralfate (CARAFATE) 1 GM/10ML suspension Take 10 mLs (1 g total) by mouth 4 (four) times daily -  with meals and at bedtime. 420 mL 4  . tiotropium (SPIRIVA HANDIHALER) 18 MCG inhalation capsule Place 1 capsule (18 mcg total) into inhaler and inhale daily. 90 capsule 3  . furosemide (LASIX) 20 MG tablet Take 1 tablet (20 mg total) by mouth daily. 90 tablet 1  . sodium chloride (OCEAN) 0.65 % SOLN nasal spray Place 2 sprays into both nostrils every 2 (two) hours while awake. 88 mL 5   No current facility-administered medications for this visit.     OBJECTIVE: Vitals:   11/03/17 0929  BP: 115/66  Pulse:  86  Resp: 18  Temp: 97.9 F (36.6 C)     Body mass index is 21.49 kg/m.    ECOG FS:0 - Asymptomatic  General: Well-developed, well-nourished, no acute distress. Eyes: Pink conjunctiva, anicteric sclera. Lungs: Clear to auscultation bilaterally. Heart: Regular rate and rhythm. No rubs, murmurs, or gallops. Abdomen: Soft, nontender, nondistended. No organomegaly noted, normoactive bowel sounds. Musculoskeletal: No edema, cyanosis, or clubbing. Neuro: Alert, answering all questions appropriately. Cranial nerves grossly intact. Skin: No rashes or petechiae noted. Psych: Normal affect.   LAB RESULTS:  Lab Results  Component Value Date   NA 139 10/26/2017   K 4.0 10/26/2017   CL 97 (L) 10/26/2017   CO2 31 10/26/2017   GLUCOSE 85 10/26/2017   BUN 22 (H) 10/26/2017   CREATININE 0.74 10/26/2017   CALCIUM 9.8 10/26/2017   PROT 6.4 08/19/2017   ALBUMIN 4.5 08/19/2017   AST 15 08/19/2017   ALT 18 08/19/2017   ALKPHOS 73 08/19/2017   BILITOT 0.2 08/19/2017   GFRNONAA >60 10/26/2017   GFRAA >60 10/26/2017    Lab Results  Component Value Date   WBC 8.3 10/26/2017   NEUTROABS 3.7 06/17/2017   HGB 13.9 10/26/2017   HCT 41.1 10/26/2017   MCV 90.9 10/26/2017   PLT 266 10/26/2017     STUDIES: Dg Chest 2 View  Result Date: 10/31/2017 CLINICAL DATA:  Chest pain and short of breath EXAM: CHEST  2 VIEW COMPARISON:  CT 10/26/2017, chest x-ray 01/15/2017 FINDINGS: COPD with pulmonary hyperinflation. Bilateral upper lobe nodules unchanged from recent CT. Suspicion for neoplasm. Negative for heart failure. Negative for pneumonia or effusion. Chronic left rib fractures. IMPRESSION: Advanced COPD. Bilateral upper lobe lung nodules, suspicious for neoplasm unchanged from recent CT. Electronically Signed   By: Franchot Gallo M.D.   On: 10/31/2017 13:07   Ct Chest Wo Contrast  Result Date: 10/04/2017 CLINICAL DATA:  Mild chest pain for several months, followup of small lung nodule EXAM: CT  CHEST WITHOUT CONTRAST TECHNIQUE:  Multidetector CT imaging of the chest was performed following the standard protocol without IV contrast. COMPARISON:  Chest x-ray of 01/15/2017 and CT chest of 06/18/2015 FINDINGS: Cardiovascular: Moderate thoracic aortic atherosclerosis is present. The heart is within normal limits in size. No pericardial effusion is noted. Cardiac stents are noted in the left anterior descending and right coronary artery distribution. The mid ascending thoracic aorta measures 30 mm in diameter. The pulmonary artery segments are slightly prominent suggesting a degree of pulmonary arterial hypertension. Mediastinum/Nodes: No mediastinal or hilar adenopathy is noted on this unenhanced study. A calcified node is present in the right axilla on image 26 possibly due to prior granulomatous disease. The thyroid gland is again noted to be inhomogeneous with small nodules present. The esophagus is unremarkable. Lungs/Pleura: The 2 small nodules described previously within the superior segment of the left lower lobe are no longer seen and most likely were postinflammatory. However there is a new nodular lesion within the right upper lobe on image 42 series 3 measuring 14 x 18 mm. A second new irregularly marginated nodule is present in the left upper lobe inferiorly and laterally on image 49 series 3 measuring 13 x 17 mm. These lesions are highly suspicious for primary possibly synchronous carcinoma, or metastatic involvement of the lungs. A 6 mm nodule is noted in the superior segment of the left lower lobe posteriorly on image 74. A vague nodule is present in the right middle lobe on image 108 which is not seen on sagittal images and may represent a vessel. A small calcified granuloma is present in the anterior right lower lobe on image 135 also most likely due to prior granulomatous disease. Diffuse changes of centrilobular emphysema are present. No pneumonia or pleural effusion is seen. The central  airway is patent. Upper Abdomen: Bilateral adrenal adenomas appear stable, left larger than right. Calcified splenic granulomas are present. Musculoskeletal: There are degenerative changes in the mid to lower thoracic spine. No compression deformity is seen. No lytic or blastic bony lesion is seen. IMPRESSION: 1. Two new irregularly marginated nodules, one in the right upper lobe, and a second in the mid left upper lobe worrisome for either synchronous lung carcinoma or metastatic involvement of the lungs. 2. The two small nodules described on the prior dictated report have resolved in the left superior segment and most likely were postinflammatory. 3. Diffuse centrilobular emphysema. 4. Changes of prior granulomatous disease. Electronically Signed   By: Ivar Drape M.D.   On: 10/04/2017 14:42   Nm Pet Image Initial (pi) Skull Base To Thigh  Result Date: 10/13/2017 CLINICAL DATA:  Initial treatment strategy for pulmonary nodule. EXAM: NUCLEAR MEDICINE PET SKULL BASE TO THIGH TECHNIQUE: 13.2 mCi F-18 FDG was injected intravenously. Full-ring PET imaging was performed from the skull base to thigh after the radiotracer. CT data was obtained and used for attenuation correction and anatomic localization. FASTING BLOOD GLUCOSE:  Value: 86 mg/dl COMPARISON:  CT of the chest October 04, 2017 FINDINGS: NECK: No hypermetabolic lymph nodes in the neck. CHEST: The new right upper lobe pulmonary nodule measures 1.8 by 1.4 cm on both studies with a maximum SUV of 1.4. The new nodule in the left upper lobe measures 16 by 13 mm on today's study versus 17 x 13 mm previously with a maximum SUV of 5.8. No other abnormal FDG uptake in the chest. ABDOMEN/PELVIS: There is a nodule in the left adrenal gland with a maximum SUV of less than 0 Hounsfield units consistent  with an adenoma. No significant uptake seen in either adrenal gland. There is a probable small adenoma in the right adrenal gland as well. Atherosclerotic changes seen  in the nonaneurysmal aorta. The penile implant reservoir is collapsed. No other significant CT findings. No other abnormal uptake in the abdomen or pelvis. SKELETON: No focal hypermetabolic activity to suggest skeletal metastasis. IMPRESSION: 1. The new left upper lobe nodule is FDG avid consistent with malignancy. Recommend tissue confirmation. 2. The new nodule in the right upper lobe demonstrates low level uptake. While the level of uptake suggests the possibility of an inflammatory or infectious process, a low-grade malignancy is not excluded. Given the suspected malignancy on the left, tissue confirmation should be considered on the right despite the low level of uptake. 3. No distant metastatic disease identified. Electronically Signed   By: Dorise Bullion III M.D   On: 10/13/2017 12:58   Dg C-arm 1-60 Min-no Report  Result Date: 10/27/2017 Fluoroscopy was utilized by the requesting physician.  No radiographic interpretation.   Ct Super D Chest Wo Contrast  Result Date: 10/26/2017 CLINICAL DATA:  Bronchoscopy planning. Hypermetabolic upper lobe pulmonary nodules. EXAM: CT CHEST WITHOUT CONTRAST TECHNIQUE: Multidetector CT imaging of the chest was performed using thin slice collimation for electromagnetic bronchoscopy planning purposes, without intravenous contrast. COMPARISON:  PET-CT 10/13/2017 FINDINGS: Cardiovascular: Coronary artery calcification and aortic atherosclerotic calcification. Mediastinum/Nodes: No axillary or supraclavicular adenopathy. Loose body into the RIGHT shoulder joint. No mediastinal adenopathy.  No hilar adenopathy. Lungs/Pleura: 15 mm RIGHT upper lobe nodule with some parenchymal retraction (image 19, series 3). Similar 14 mm LEFT upper lobe nodule (image 20, series 3). Lungs are hyperinflated.  Centrilobular emphysema Upper Abdomen: Low-density enlargement of the LEFT adrenal gland consistent benign adenoma. Musculoskeletal: No aggressive osseous lesion IMPRESSION: 1.  Bilateral upper lobe solitary pulmonary nodules. Nodules hypermetabolic on recent FDG PET scan. 2. No mediastinal lymphadenopathy. 3. Emphysema. Electronically Signed   By: Suzy Bouchard M.D.   On: 10/26/2017 14:53    ASSESSMENT: Bilateral pulmonary nodule.  PLAN:    1. Bilateral pulmonary nodule: CT and PET results reviewed independently.  Patient underwent biopsies of both lesions which returned negative for malignancy.  Despite this, still high suspicion of synchronous primaries in his right and left upper lobe. Percutaneous CT-guided biopsy was thought to be high risk for pneumothorax.  Plan is to repeat CT scan in 3 months to assess for interval change.  If lesions increase in size, will consider repeat biopsy or possibly treatment with SBRT.  Return to clinic 1-2 days after CT scan to discuss the results and additional treatment or diagnostic planning. 2.  Smoking cessation: Patient was given resources and states he plans to quit.  Approximately 30 minutes was spent in discussion of which greater than 50% was consultation.  Patient expressed understanding and was in agreement with this plan. He also understands that He can call clinic at any time with any questions, concerns, or complaints.   Cancer Staging No matching staging information was found for the patient.  Lloyd Huger, MD   11/03/2017 10:58 AM

## 2017-11-03 ENCOUNTER — Encounter: Payer: Self-pay | Admitting: *Deleted

## 2017-11-03 ENCOUNTER — Inpatient Hospital Stay (HOSPITAL_BASED_OUTPATIENT_CLINIC_OR_DEPARTMENT_OTHER): Payer: BLUE CROSS/BLUE SHIELD | Admitting: Oncology

## 2017-11-03 VITALS — BP 115/66 | HR 86 | Temp 97.9°F | Resp 18 | Wt 149.8 lb

## 2017-11-03 DIAGNOSIS — I10 Essential (primary) hypertension: Secondary | ICD-10-CM | POA: Diagnosis not present

## 2017-11-03 DIAGNOSIS — E119 Type 2 diabetes mellitus without complications: Secondary | ICD-10-CM

## 2017-11-03 DIAGNOSIS — F419 Anxiety disorder, unspecified: Secondary | ICD-10-CM | POA: Diagnosis not present

## 2017-11-03 DIAGNOSIS — Z79899 Other long term (current) drug therapy: Secondary | ICD-10-CM

## 2017-11-03 DIAGNOSIS — R911 Solitary pulmonary nodule: Secondary | ICD-10-CM

## 2017-11-03 DIAGNOSIS — Z7982 Long term (current) use of aspirin: Secondary | ICD-10-CM

## 2017-11-03 DIAGNOSIS — J449 Chronic obstructive pulmonary disease, unspecified: Secondary | ICD-10-CM

## 2017-11-03 DIAGNOSIS — Z801 Family history of malignant neoplasm of trachea, bronchus and lung: Secondary | ICD-10-CM

## 2017-11-03 DIAGNOSIS — R918 Other nonspecific abnormal finding of lung field: Secondary | ICD-10-CM | POA: Diagnosis not present

## 2017-11-03 DIAGNOSIS — E785 Hyperlipidemia, unspecified: Secondary | ICD-10-CM

## 2017-11-03 DIAGNOSIS — I252 Old myocardial infarction: Secondary | ICD-10-CM

## 2017-11-03 DIAGNOSIS — I2510 Atherosclerotic heart disease of native coronary artery without angina pectoris: Secondary | ICD-10-CM | POA: Diagnosis not present

## 2017-11-03 DIAGNOSIS — K219 Gastro-esophageal reflux disease without esophagitis: Secondary | ICD-10-CM

## 2017-11-03 DIAGNOSIS — F17210 Nicotine dependence, cigarettes, uncomplicated: Secondary | ICD-10-CM | POA: Diagnosis not present

## 2017-11-03 DIAGNOSIS — E079 Disorder of thyroid, unspecified: Secondary | ICD-10-CM

## 2017-11-03 DIAGNOSIS — Z8711 Personal history of peptic ulcer disease: Secondary | ICD-10-CM

## 2017-11-03 NOTE — Progress Notes (Signed)
  Oncology Nurse Navigator Documentation  Navigator Location: [facility] (11/03/17 1000)   )Navigator Encounter Type: Other (11/03/17 1000)                         Barriers/Navigation Needs: Coordination of Care (11/03/17 1000)   Interventions: Coordination of Care (11/03/17 1000)  pt scheduled to follow up in 3 months with CT scan to evaluate bilateral pulmonary nodules. Pt will follow up with Dr. Grayland Ormond after CT scan to review results. Spoke with Dr. Baruch Gouty regarding if patient would be candidate for SBRT treatments in the future to treat both pulmonary nodules. Dr. Baruch Gouty stated that can do SBRT treatments if nodules increase in size on CT scan and he will not need to pursue another biopsy. Dr. Grayland Ormond has been made aware of recommendation and will assess need for SBRT and discuss with patient after CT scan in 3 months. Nothing further needed at this time.                     Time Spent with Patient: 15 (11/03/17 1000)

## 2017-11-14 ENCOUNTER — Other Ambulatory Visit: Payer: Self-pay | Admitting: Internal Medicine

## 2017-11-14 ENCOUNTER — Other Ambulatory Visit: Payer: Self-pay | Admitting: *Deleted

## 2017-11-14 ENCOUNTER — Telehealth: Payer: Self-pay | Admitting: Pulmonary Disease

## 2017-11-14 MED ORDER — PREDNISONE 10 MG (21) PO TBPK: ORAL_TABLET | ORAL | 0 refills | Status: DC

## 2017-11-14 NOTE — Progress Notes (Signed)
Returned call to patient and made him aware rx sent to his pharmacy. Asked that he call office back to let us know if this helps.

## 2017-11-14 NOTE — Telephone Encounter (Signed)
Might be bronchitis from the biopsy. Sent script for prednisone taper.

## 2017-11-14 NOTE — Telephone Encounter (Signed)
Pt calling stating since his biopsy two weeks ago He's been having some chest discomfort along with some burning  Not sure if he needs to go ED he states or not   Would like some advise on this  Please call back

## 2017-11-16 ENCOUNTER — Ambulatory Visit: Payer: BLUE CROSS/BLUE SHIELD | Admitting: Family Medicine

## 2017-11-16 ENCOUNTER — Other Ambulatory Visit: Payer: Self-pay

## 2017-11-16 ENCOUNTER — Ambulatory Visit (INDEPENDENT_AMBULATORY_CARE_PROVIDER_SITE_OTHER): Payer: BLUE CROSS/BLUE SHIELD

## 2017-11-16 ENCOUNTER — Encounter: Payer: Self-pay | Admitting: Family Medicine

## 2017-11-16 VITALS — BP 102/62 | HR 115 | Temp 98.7°F | Resp 16 | Ht 69.29 in | Wt 148.0 lb

## 2017-11-16 DIAGNOSIS — J449 Chronic obstructive pulmonary disease, unspecified: Secondary | ICD-10-CM | POA: Diagnosis not present

## 2017-11-16 DIAGNOSIS — F341 Dysthymic disorder: Secondary | ICD-10-CM | POA: Diagnosis not present

## 2017-11-16 DIAGNOSIS — R079 Chest pain, unspecified: Secondary | ICD-10-CM | POA: Diagnosis not present

## 2017-11-16 DIAGNOSIS — R918 Other nonspecific abnormal finding of lung field: Secondary | ICD-10-CM | POA: Diagnosis not present

## 2017-11-16 DIAGNOSIS — J22 Unspecified acute lower respiratory infection: Secondary | ICD-10-CM | POA: Diagnosis not present

## 2017-11-16 IMAGING — DX DG CHEST 2V
3 series · 3 of 3 positions shown · non-contrast
Comparison: [DATE]

CLINICAL DATA: Left chest burning since bronchoscopy 2 weeks ago.

EXAM:
CHEST  2 VIEW

[chest pa (1 of 2)]
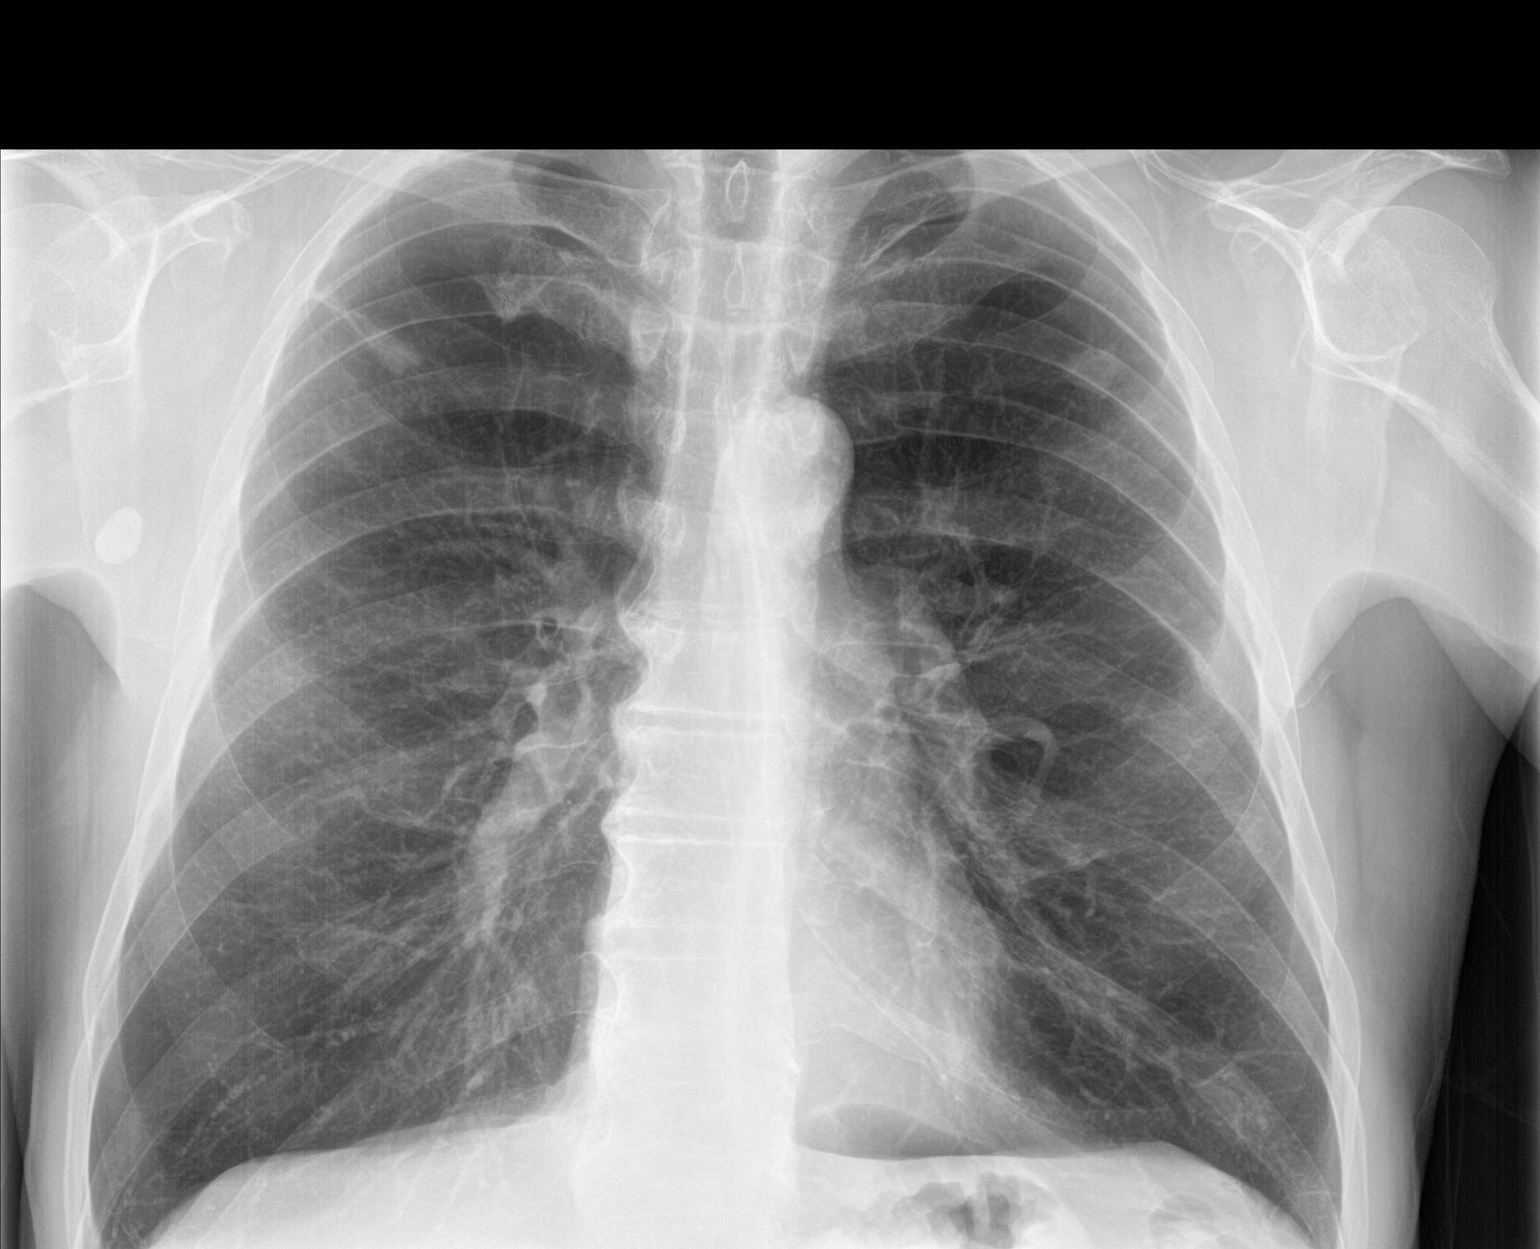

[chest lat]
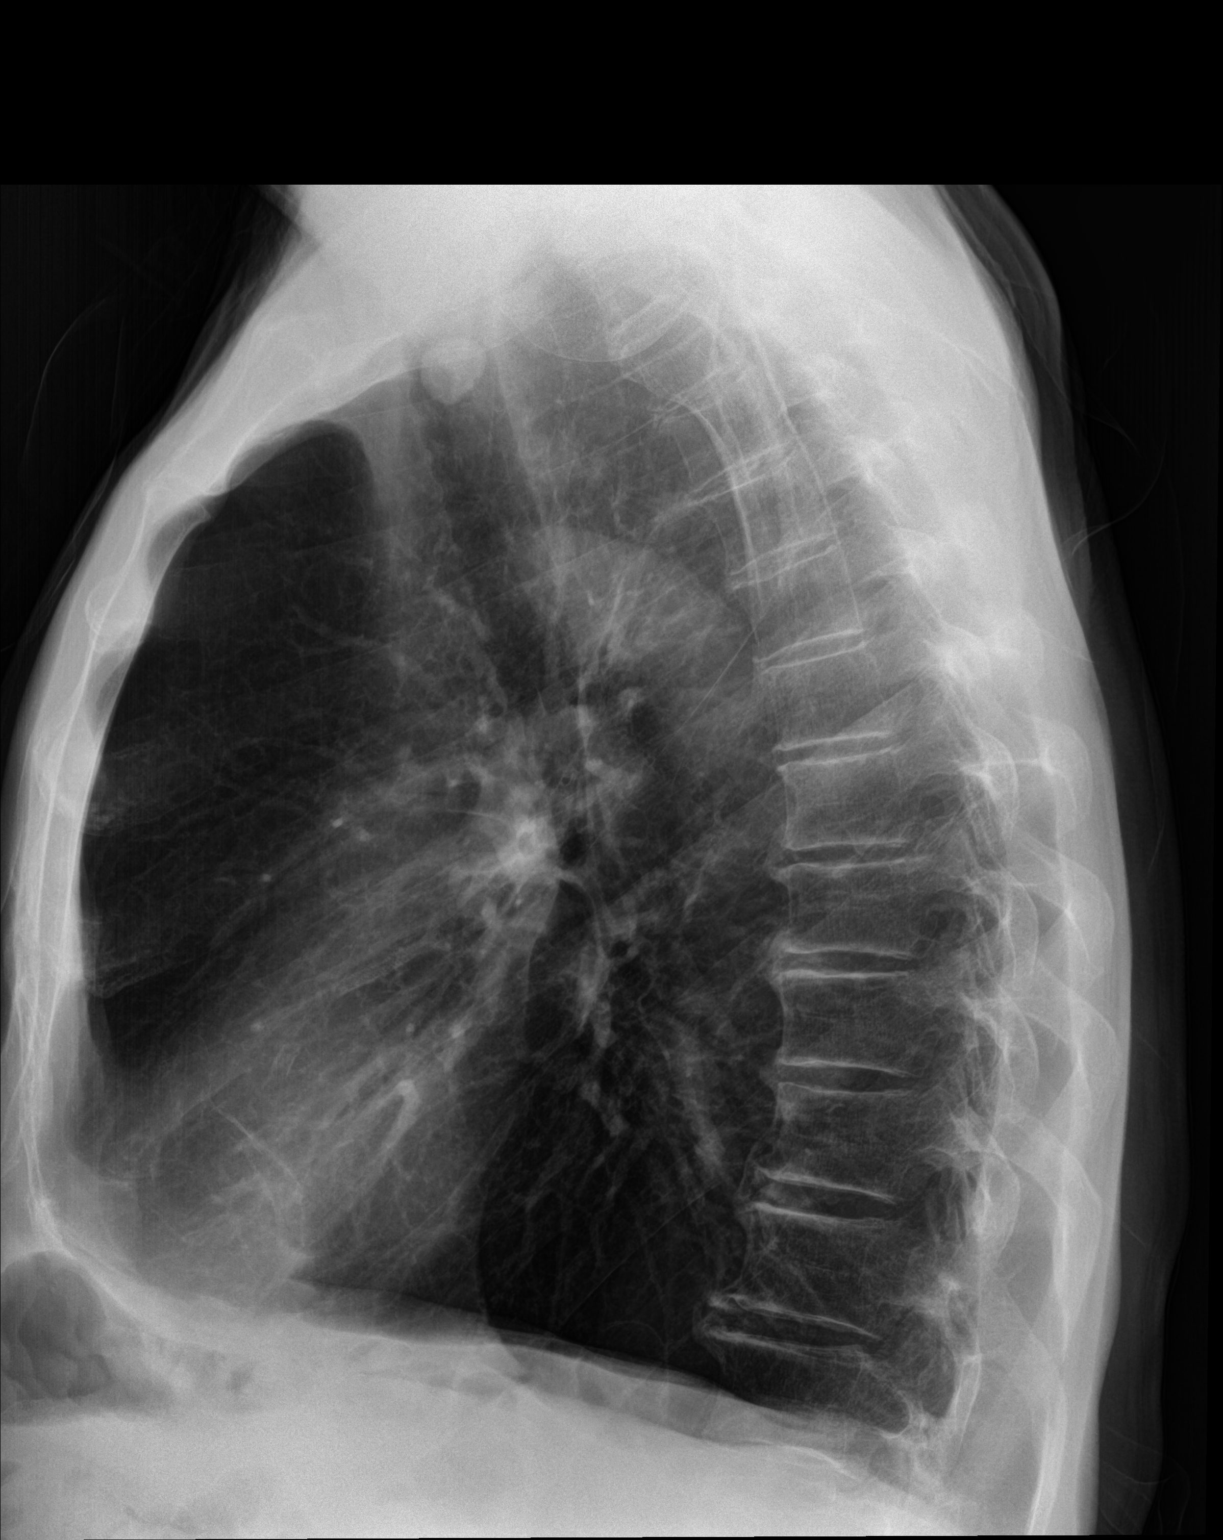

[chest pa (2 of 2)]
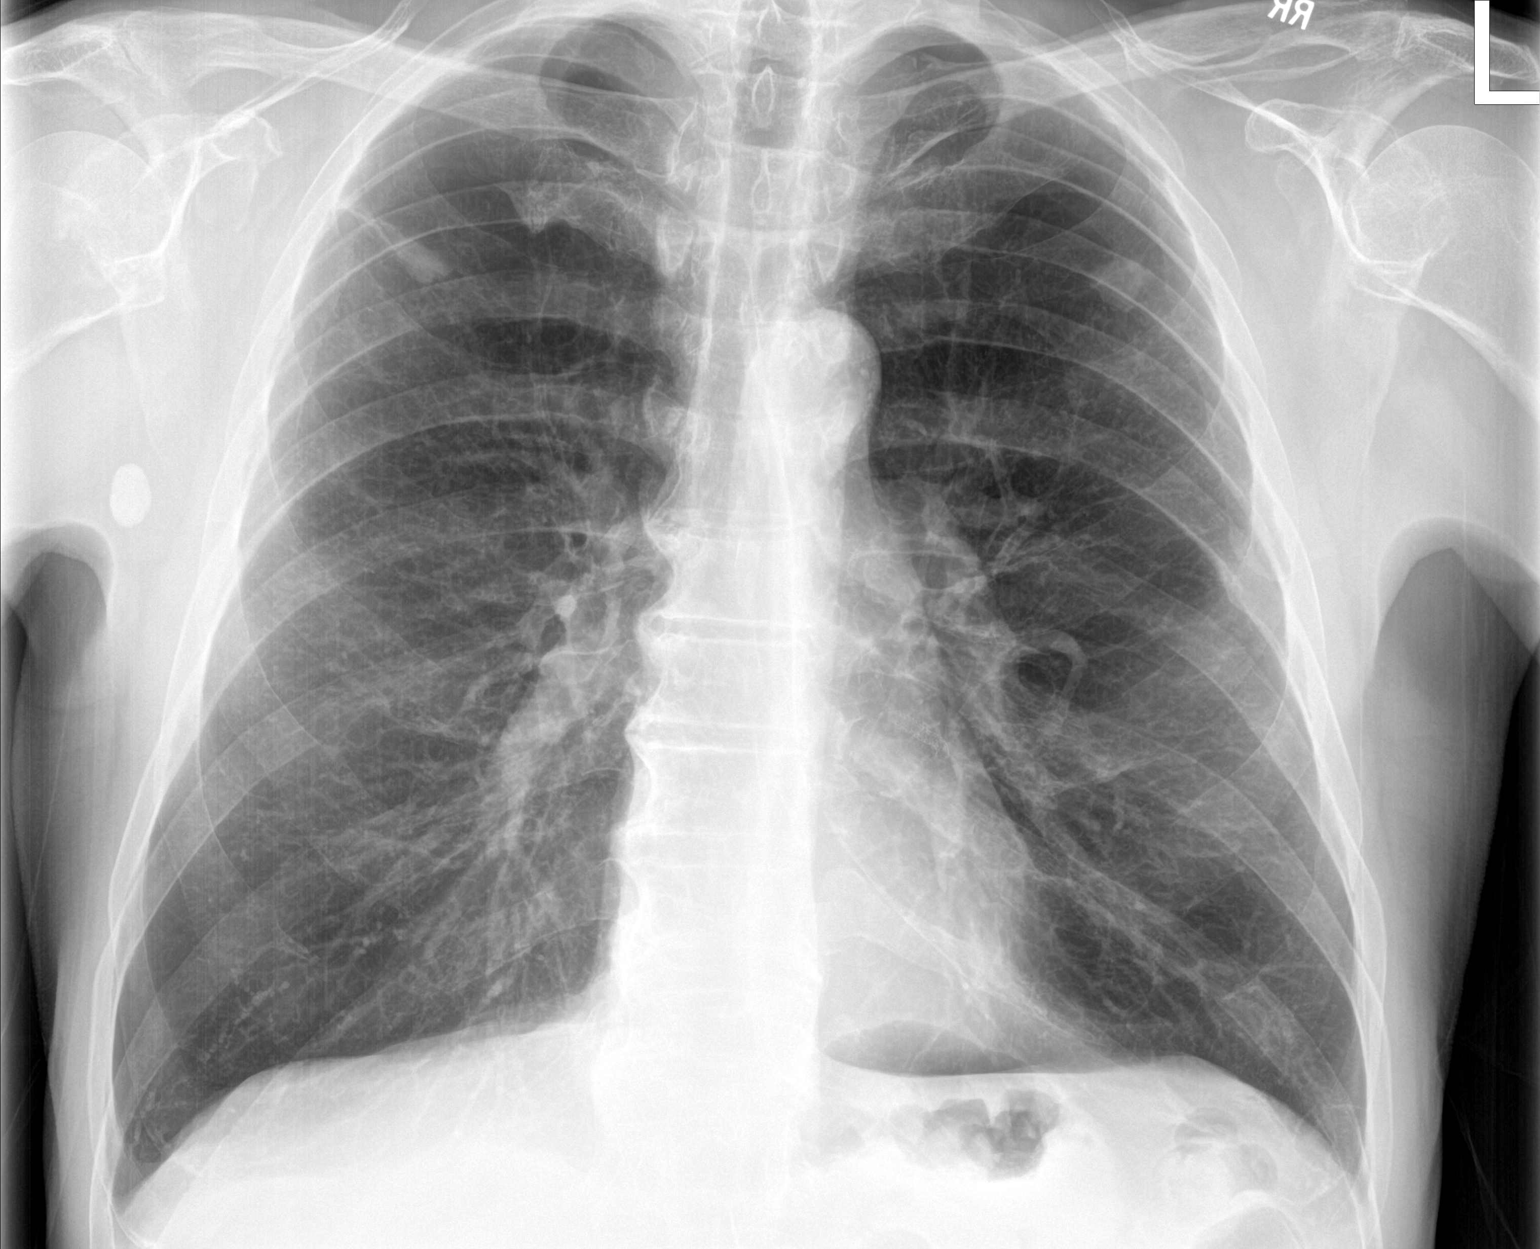

[3 of 3 positions shown; findings below may reference images not displayed]

FINDINGS: Hyperinflation and emphysema. Elongated right and rounded left
biapical nodules as described on CT [DATE]. Normal heart size
and mediastinal contours. Mild scarring at the level of the lingula.
No acute osseous finding. Calcified node in the right axilla.
IMPRESSION: 1. No acute finding or change from prior.
2. Known biapical pulmonary nodule.

## 2017-11-16 MED ORDER — DOXYCYCLINE HYCLATE 100 MG PO TABS: 100.0000 mg | ORAL_TABLET | Freq: Two times a day (BID) | ORAL | 0 refills | Status: DC

## 2017-11-16 NOTE — Patient Instructions (Addendum)
  Change Zoloft to 100mg  tablet plus a 50mg  tablet each day = 150mg  daily Zoloft (Take 100mg  at night, take 50mg  every morning). Restart Mirtazipine 15mg  at bedtime to help with sleep, depression, appetite. Call Hospice to start counseling. Decrease nerve pill to 1/2 every morning.   IF you received an x-ray today, you will receive an invoice from [facility]. Please contact [facility] at [phone] with questions or concerns regarding your invoice.   IF you received labwork today, you will receive an invoice from [facility]. Please contact [facility] at [phone] with questions or concerns regarding your invoice.   Our billing staff will not be able to assist you with questions regarding bills from these companies.  You will be contacted with the lab results as soon as they are available. The fastest way to get your results is to activate your My Chart account. Instructions are located on the last page of this paperwork. If you have not heard from [place] regarding the results in 2 weeks, please contact this office.

## 2017-11-16 NOTE — Progress Notes (Signed)
Subjective:    Patient ID: Leslie Sims, male    DOB: 05-13-1951, 66 y.o.   MRN: [id number]  11/16/2017  Cough (with some chest burning x 2 weeks )    HPI This 66 y.o. male presents for evaluation of burning in L anterior chest immediately after lung biopsy.  Called pulmonologist; performed CXR; no infiltrate.  No improvement.  Called in Prednisone two days ago.  No fever/chills/sweats.  Throat clearing in throat.  No rhinorrhea; no nasal congestion.  Cannot get a lot of mucous up. Yellow mucous with some green.  SOB.  Pulse ox running 92-95%; wheezing.   BP running 109-127/61-80.  No nausea or vomiting.  Fatigued more than normal.  Sleeping too much.  Sleeping all day and all night for a while.  So tired.  Taking Zoloft bid.     BP Readings from Last 3 Encounters:  11/16/17 102/62  11/03/17 115/66  10/27/17 125/68   Wt Readings from Last 3 Encounters:  11/16/17 148 lb (67.1 kg)  11/03/17 149 lb 12.8 oz (67.9 kg)  10/27/17 148 lb (67.1 kg)   Immunization History  Administered Date(s) Administered  . Influenza Whole 09/19/2012  . Influenza,inj,Quad PF,6+ Mos 10/24/2013, 11/25/2014, 03/02/2016, 10/27/2016, 10/05/2017  . Pneumococcal Conjugate-13 11/25/2014  . Pneumococcal Polysaccharide-23 08/24/2016, 10/27/2016  . Pneumococcal-Unspecified 12/20/2008  . Tdap 12/20/2008    Review of Systems  Constitutional: Positive for fatigue. Negative for activity change, appetite change, chills, diaphoresis and fever.  HENT: Negative for congestion, ear pain, nosebleeds, postnasal drip, rhinorrhea, sinus pressure, sinus pain, sneezing, sore throat, trouble swallowing and voice change.   Respiratory: Positive for chest tightness and shortness of breath. Negative for cough and wheezing.   Cardiovascular: Negative for chest pain, palpitations and leg swelling.  Gastrointestinal: Negative for abdominal pain, diarrhea, nausea and vomiting.  Endocrine: Negative for cold intolerance,  heat intolerance, polydipsia, polyphagia and polyuria.  Skin: Negative for color change, rash and wound.  Neurological: Negative for dizziness, tremors, seizures, syncope, facial asymmetry, speech difficulty, weakness, light-headedness, numbness and headaches.  Psychiatric/Behavioral: Positive for dysphoric mood. Negative for self-injury, sleep disturbance and suicidal ideas. The patient is nervous/anxious.     Past Medical History:  Diagnosis Date  . Allergic rhinitis   . Anxiety   . Atherosclerosis   . Back pain   . Bronchitis    09-14-11  . CARDIOMYOPATHY 03/24/2010   did not tolerate Toprol and Lisinopril.   . Chest pain   . Chronic airway obstruction, not elsewhere classified   . Colon polyps 08/2012   colonoscopy; multiple colon polyps; repeat colonoscopy in one year.  [name] COPD (chronic obstructive pulmonary disease) ([place])   . Coronary artery disease 11/2009   Anterior MI with late presentation 11/2009. Cath showed a 100% proximal LAD occlusion, 99% mid RCA. Had a PCI and 2 DES to both LAD and RCA. EF was 35%. Nuclear stress test 05/13: prior anterior/inferior infarcts without ischemic, EF 43%, cardiac cath in 02/14: Widely patent stents with no other obstructive disease. EF 40%   . Depression   . Diabetes mellitus without complication ([place])   . GERD (gastroesophageal reflux disease)   . Headache(784.0)   . Helicobacter pylori (H. pylori)   . Hemorrhoids   . Hernia    inguinal  . Hyperlipidemia   . Hypertension   . Insomnia   . MI (myocardial infarction) ([place])   . Myalgia   . Peptic ulcer   . Shortness of breath dyspnea   . Status  post dilation of esophageal narrowing 2000  . Thyroid dysfunction   . Tinea pedis   . Wears dentures    partial upper   Past Surgical History:  Procedure Laterality Date  . Admission  12/20/2012   COPD exacerbation.  [place].  [name] CARDIAC CATHETERIZATION    . CARDIAC CATHETERIZATION  02/05/13   [facility]  . CARDIAC CATHETERIZATION  10/14   [facility] :  patent stents with no change in anatomy. EF: 40$  . CARDIAC CATHETERIZATION Left 11/12/2016   Procedure: Left Heart Cath and Coronary Angiography;  Surgeon: Wellington Hampshire, MD;  Location: [facility];  Service: Cardiovascular;  Laterality: Left;  . COLONOSCOPY    . COLONOSCOPY WITH PROPOFOL N/A 05/18/2016   Procedure: COLONOSCOPY WITH PROPOFOL;  Surgeon: Lucilla Lame, MD;  Location: [facility];  Service: Endoscopy;  Laterality: N/A;  . CORONARY ANGIOPLASTY WITH STENT PLACEMENT  09/2009   LAD 3.0 X23 mm Xience DES, RCA: 4.0 X 15 mm Xience DES  . ELECTROMAGNETIC NAVIGATION BROCHOSCOPY N/A 10/27/2017   Procedure: ELECTROMAGNETIC NAVIGATION BRONCHOSCOPY;  Surgeon: Flora Lipps, MD;  Location: [facility];  Service: Cardiopulmonary;  Laterality: N/A;  . ESOPHAGEAL DILATION    . ESOPHAGOGASTRODUODENOSCOPY  2008  . ESOPHAGOGASTRODUODENOSCOPY  08/20/2012  . HERNIA REPAIR  07/20/2012   L inguinal hernia repair  . PENILE PROSTHESIS IMPLANT     Allergies  Allergen Reactions  . Effexor Xr [Venlafaxine Hcl Er]   . Wellbutrin [Bupropion] Other (See Comments)    Unknown/"made me feel real funny"   Current Outpatient Medications on File Prior to Visit  Medication Sig Dispense Refill  . albuterol (PROVENTIL) (2.5 MG/3ML) 0.083% nebulizer solution USE 1 VIAL (3 ML) BY NEBULIZATION EVERY 6 HOURS AS NEEDED FOR WHEEZING OR SHORTNESS OF BREATH 1080 mL 3  . albuterol (VENTOLIN HFA) 108 (90 Base) MCG/ACT inhaler USE 2 INHALATIONS EVERY 6 HOURS AS NEEDED FOR WHEEZING 48 g 3  . aspirin 81 MG tablet Take 81 mg by mouth daily.      [name] atorvastatin (LIPITOR) 40 MG tablet Take 1 tablet (40 mg total) by mouth daily. 90 tablet 3  . azelastine (ASTELIN) 0.1 % nasal spray Place 2 sprays into both nostrils 2 (two) times daily. Use in each nostril as directed 30 mL 12  . fluticasone (FLONASE) 50 MCG/ACT nasal spray Place 1 spray into both nostrils 2 (two) times daily. 48 g 3  . Fluticasone-Salmeterol (ADVAIR DISKUS)  500-50 MCG/DOSE AEPB Inhale 1 puff into the lungs 2 (two) times daily. 180 each 3  . ipratropium-albuterol (DUONEB) 0.5-2.5 (3) MG/3ML SOLN INHALE 1 VIAL BY NEBULIZER EVERY 6 HOURS AS NEEDED 270 mL 3  . ketoconazole (NIZORAL) 2 % cream Apply 1 application topically 2 (two) times daily. 60 g 0  . LORazepam (ATIVAN) 0.5 MG tablet Take 1 tablet (0.5 mg total) by mouth every 6 (six) hours as needed. for anxiety 120 tablet 2  . losartan (COZAAR) 25 MG tablet Take 1 tablet (25 mg total) by mouth daily. 90 tablet 3  . mirtazapine (REMERON) 15 MG tablet Take 1 tablet (15 mg total) by mouth at bedtime. 30 tablet 5  . Multiple Vitamin (MULTIVITAMIN) capsule Take 1 capsule by mouth daily.    . nitroGLYCERIN (NITROSTAT) 0.4 MG SL tablet Place 1 tablet (0.4 mg total) under the tongue every 5 (five) minutes as needed. 25 tablet 3  . nystatin (MYCOSTATIN) 100000 UNIT/ML suspension Take 5 mLs (500,000 Units total) by mouth 4 (four) times daily. 120 mL  0  . predniSONE (STERAPRED UNI-PAK 21 TAB) 10 MG (21) TBPK tablet Take as directed 21 tablet 0  . sertraline (ZOLOFT) 50 MG tablet Take 2-3 tablets (100-150 mg total) by mouth daily. 270 tablet 1  . sucralfate (CARAFATE) 1 GM/10ML suspension Take 10 mLs (1 g total) by mouth 4 (four) times daily -  with meals and at bedtime. 420 mL 4  . tiotropium (SPIRIVA HANDIHALER) 18 MCG inhalation capsule Place 1 capsule (18 mcg total) into inhaler and inhale daily. 90 capsule 3  . furosemide (LASIX) 20 MG tablet Take 1 tablet (20 mg total) by mouth daily. 90 tablet 1  . sodium chloride (OCEAN) 0.65 % SOLN nasal spray Place 2 sprays into both nostrils every 2 (two) hours while awake. 88 mL 5   No current facility-administered medications on file prior to visit.    Social History   Socioeconomic History  . Marital status: Widowed    Spouse name: Not on file  . Number of children: 5  . Years of education: Not on file  . Highest education level: Not on file  Social Needs  .  Financial resource strain: Not on file  . Food insecurity - worry: Not on file  . Food insecurity - inability: Not on file  . Transportation needs - medical: Not on file  . Transportation needs - non-medical: Not on file  Occupational History  . 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 Smoking status: Current Every Day Smoker    Packs/day: 1.50    Years: 42.00    Pack years: 63.00    Types: Cigarettes  . Smokeless tobacco: Never Used  . Tobacco comment: smoking 15-18 cigarettes daily  Substance and Sexual Activity  . Alcohol use: No    Alcohol/week: 0.0 oz  . Drug use: No  . Sexual activity: Yes  Other Topics Concern  . Not on file  Social History Narrative   Marital status: married x 32 years; happily married.      Children: 4 children;  1 stepchild and 15 grandchildren; no great grandchildren.      Lives: with wife; 2 dogs, 1 cat.      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      Tobacco: 1 ppd x 45 years      Alcohol: none; previous alcoholism      Drugs: none      Exercise: no formal exercise; physically demanding job; owns horses.      Seatbelts:  100%      Guns: one loaded unsecured gun in the home.      Sunscreen: none   Family History  Problem Relation Age of Onset  . Heart attack Brother        Brother #1  . Diabetes Brother   . Hypertension Brother        #3  . Coronary artery disease Father 4       deceased  . Heart attack Father   . Diabetes Father   . Heart disease Father   . COPD Mother 83       deceased  . Alcohol abuse Sister        polysubstance abuse  . COPD Sister   . Lung cancer Sister   . Alcohol abuse Sister        polysubstance abuse  . Penile cancer Brother   . Diabetes Brother        Objective:  BP 102/62   Pulse (!) 115   Temp 98.7 F (37.1 C) (Oral)   Resp 16   Ht 5' 9.29" (1.76 m)   Wt 148 lb (67.1 kg)   SpO2 94%   BMI 21.67 kg/m  Physical Exam    Constitutional: He is oriented to person, place, and time. He appears well-developed and well-nourished. No distress.  HENT:  Head: Normocephalic and atraumatic.  Right Ear: External ear normal.  Left Ear: External ear normal.  Nose: Nose normal.  Mouth/Throat: Oropharynx is clear and moist.  Eyes: Conjunctivae and EOM are normal. Pupils are equal, round, and reactive to light.  Neck: Normal range of motion. Neck supple. Carotid bruit is not present. No thyromegaly present.  Cardiovascular: Normal rate, regular rhythm, normal heart sounds and intact distal pulses. Exam reveals no gallop and no friction rub.  No murmur heard. Pulmonary/Chest: Effort normal and breath sounds normal. He has no wheezes. He has no rales.  Abdominal: Soft. Bowel sounds are normal. He exhibits no distension and no mass. There is no tenderness. There is no rebound and no guarding.  Lymphadenopathy:    He has no cervical adenopathy.  Neurological: He is alert and oriented to person, place, and time. No cranial nerve deficit.  Skin: Skin is warm and dry. No rash noted. He is not diaphoretic.  Psychiatric: His speech is normal and behavior is normal. Judgment and thought content normal. Cognition and memory are normal. He exhibits a depressed mood.  Tearful.  Nursing note and vitals reviewed.  No results found. Depression screen [facility] 2/9 11/16/2017 10/05/2017 09/07/2017 08/19/2017 07/27/2017  Decreased Interest 2 1 2 3 3   Down, Depressed, Hopeless 2 1 2 2 3   PHQ - 2 Score 4 2 4 5 6   Altered sleeping 1 0 2 3 3   Tired, decreased energy 1 1 2 3 3   Change in appetite 2 0 2 2 3   Feeling bad or failure about yourself  1 1 2 1 1   Trouble concentrating 0 0 2 2 3   Moving slowly or fidgety/restless 0 0 0 1 2  Suicidal thoughts 0 0 0 0 0  PHQ-9 Score 9 4 14 17 21   Difficult doing work/chores - - - - Somewhat difficult  Some recent data might be hidden   Fall Risk  11/16/2017 10/05/2017 09/07/2017 08/19/2017 07/27/2017   Falls in the past year? No No No No No        Assessment & Plan:   1. Lower respiratory infection   2. Lung nodules   3. DEPRESSION/ANXIETY   4. COPD, very severe ([place])   5. Left sided chest pain    Status post biopsies of bilateral lung nodules concerning for malignancy.  Pathology is negative.  Since biopsies, patient has been suffering with chest burning and cough with minimal sputum production.  Prednisone called in to pharmacy 48 hours ago without improvement.  Repeat chest x-ray today is negative for infiltrate or acute process.  Patient is producing small amounts of thick dark sputum.  Thus we will treat with doxycycline for underlying infection and inflammation.  Continue prednisone and COPD inhalers as prescribed.  Lung biopsies negative for malignancy however PET scan concerning for neoplasm.  Patient to repeat CT scans in 3 months per oncology and pulmonology.  Ongoing depression anxiety severe as he grieves the death of his wife.  Patient's sister also died recently.  Patient has not attended any psychotherapy is planned after last visit.  Patient is also  only taking Zoloft 100 mg daily and is not using Remeron regularly at nighttime.  Patient is to start Remeron 15 mg every night.  Increase sertraline to 150 mg daily.  Decrease Lorazepam to 1/2 tablet 4 times daily to decrease hypersomnolence during the day.  Patient continues to get out on a regular basis and interact with family and friends.  I encouraged regular outings.  I recommend patient staying busy throughout the day.  Pt to contact hospice to initiate psychotherapy.  Patient has decreased smoking since last visit; congratulations on this success!  Orders Placed This Encounter  Procedures  . DG Chest 2 View    Standing Status:   Future    Standing Expiration Date:   11/16/2018    Order Specific Question:   Reason for Exam (SYMPTOM  OR DIAGNOSIS REQUIRED)    Answer:   L chest burning since bronchoscopy    Order  Specific Question:   Preferred imaging location?    Answer:   External  . CBC with Differential/Platelet  . Comprehensive metabolic panel   Meds ordered this encounter  Medications  . doxycycline (VIBRA-TABS) 100 MG tablet    Sig: Take 1 tablet (100 mg total) by mouth 2 (two) times daily.    Dispense:  20 tablet    Refill:  0    Return for recheck.   Melaney Tellefsen Elayne Guerin, M.D. Primary Care at [facility] previously [facility] [address] [place], [place]  [address] [phone] phone [phone] fax

## 2017-11-17 LAB — CBC WITH DIFFERENTIAL/PLATELET
BASOS: 0 %
Basophils Absolute: 0 10*3/uL (ref 0.0–0.2)
EOS (ABSOLUTE): 0 10*3/uL (ref 0.0–0.4)
EOS: 0 %
HEMATOCRIT: 40.7 % (ref 37.5–51.0)
Hemoglobin: 13.6 g/dL (ref 13.0–17.7)
IMMATURE GRANULOCYTES: 0 %
Immature Grans (Abs): 0 10*3/uL (ref 0.0–0.1)
LYMPHS ABS: 0.8 10*3/uL (ref 0.7–3.1)
Lymphs: 8 %
MCH: 29.6 pg (ref 26.6–33.0)
MCHC: 33.4 g/dL (ref 31.5–35.7)
MCV: 89 fL (ref 79–97)
MONOS ABS: 0.5 10*3/uL (ref 0.1–0.9)
Monocytes: 5 %
NEUTROS ABS: 8.7 10*3/uL — AB (ref 1.4–7.0)
Neutrophils: 87 %
Platelets: 259 10*3/uL (ref 150–379)
RBC: 4.59 x10E6/uL (ref 4.14–5.80)
RDW: 13 % (ref 12.3–15.4)
WBC: 10 10*3/uL (ref 3.4–10.8)

## 2017-11-17 LAB — COMPREHENSIVE METABOLIC PANEL
A/G RATIO: 2.8 — AB (ref 1.2–2.2)
ALT: 21 IU/L (ref 0–44)
AST: 17 IU/L (ref 0–40)
Albumin: 4.8 g/dL (ref 3.6–4.8)
Alkaline Phosphatase: 67 IU/L (ref 39–117)
BILIRUBIN TOTAL: 0.2 mg/dL (ref 0.0–1.2)
BUN/Creatinine Ratio: 23 (ref 10–24)
BUN: 21 mg/dL (ref 8–27)
CALCIUM: 9.8 mg/dL (ref 8.6–10.2)
CHLORIDE: 102 mmol/L (ref 96–106)
CO2: 28 mmol/L (ref 20–29)
Creatinine, Ser: 0.93 mg/dL (ref 0.76–1.27)
GFR, EST AFRICAN AMERICAN: 100 mL/min/{1.73_m2} (ref >59)
GFR, EST NON AFRICAN AMERICAN: 86 mL/min/{1.73_m2} (ref >59)
GLOBULIN, TOTAL: 1.7 g/dL (ref 1.5–4.5)
Glucose: 132 mg/dL — ABNORMAL HIGH (ref 65–99)
POTASSIUM: 4.6 mmol/L (ref 3.5–5.2)
SODIUM: 144 mmol/L (ref 134–144)
Total Protein: 6.5 g/dL (ref 6.0–8.5)

## 2017-11-17 NOTE — Telephone Encounter (Signed)
Prescription shredded.  Never picked up

## 2017-11-18 ENCOUNTER — Ambulatory Visit: Payer: BLUE CROSS/BLUE SHIELD | Admitting: Family Medicine

## 2017-11-30 ENCOUNTER — Other Ambulatory Visit: Payer: Self-pay

## 2017-11-30 ENCOUNTER — Ambulatory Visit (INDEPENDENT_AMBULATORY_CARE_PROVIDER_SITE_OTHER): Payer: BLUE CROSS/BLUE SHIELD | Admitting: Family Medicine

## 2017-11-30 ENCOUNTER — Encounter: Payer: Self-pay | Admitting: Family Medicine

## 2017-11-30 VITALS — BP 108/58 | HR 86 | Temp 98.5°F | Resp 16 | Ht 69.49 in | Wt 147.0 lb

## 2017-11-30 DIAGNOSIS — F341 Dysthymic disorder: Secondary | ICD-10-CM | POA: Diagnosis not present

## 2017-11-30 DIAGNOSIS — I2510 Atherosclerotic heart disease of native coronary artery without angina pectoris: Secondary | ICD-10-CM | POA: Diagnosis not present

## 2017-11-30 DIAGNOSIS — R911 Solitary pulmonary nodule: Secondary | ICD-10-CM | POA: Diagnosis not present

## 2017-11-30 DIAGNOSIS — R0789 Other chest pain: Secondary | ICD-10-CM

## 2017-11-30 DIAGNOSIS — J449 Chronic obstructive pulmonary disease, unspecified: Secondary | ICD-10-CM

## 2017-11-30 DIAGNOSIS — Z9889 Other specified postprocedural states: Secondary | ICD-10-CM | POA: Diagnosis not present

## 2017-11-30 DIAGNOSIS — F17200 Nicotine dependence, unspecified, uncomplicated: Secondary | ICD-10-CM

## 2017-11-30 MED ORDER — SERTRALINE HCL 50 MG PO TABS: 50.0000 mg | ORAL_TABLET | Freq: Every day | ORAL | 1 refills | Status: DC

## 2017-11-30 MED ORDER — SERTRALINE HCL 100 MG PO TABS: 100.0000 mg | ORAL_TABLET | Freq: Every day | ORAL | 1 refills | Status: DC

## 2017-11-30 NOTE — Patient Instructions (Addendum)
   DECREASE NERVE PILL TO 1/2 TABLET THREE TIMES DAILY.  IF you received an x-ray today, you will receive an invoice from [facility]. Please contact [name] at [phone] with questions or concerns regarding your invoice.   IF you received labwork today, you will receive an invoice from [facility]. Please contact [facility] at [phone] with questions or concerns regarding your invoice.   Our billing staff will not be able to assist you with questions regarding bills from these companies.  You will be contacted with the lab results as soon as they are available. The fastest way to get your results is to activate your My Chart account. Instructions are located on the last page of this paperwork. If you have not heard from [place] regarding the results in 2 weeks, please contact this office.

## 2017-11-30 NOTE — Progress Notes (Signed)
Subjective:    Patient ID: Leslie Sims, male    DOB: 11-06-1951, 66 y.o.   MRN: [id number]  11/30/2017  Lower Respiratory Infection (follow-up, pt states he is still having some of the same symtoms with some shob) and Depression    HPI This 66 y.o. male presents for evaluation of depression/anxiety and COPD with lung nodules and SOB.  Still having chest burning, coughing.  Still coughing.  Completed all Doxycycline today.  S/p Prednisone.  Oxygen level 92-94%; BP 108-137/65-83.  Concerned with ongoing burning; will worsen with cold air exposure.  Less sputum from last visit; no bloody sputum.    Really SOB with pulling hose out of snow yesterday while watering horses.  Really SOB yesterday.  Did nebulizer treatment before leaving to feed horses.    Discussed with Roselyn Reef; did not recommend going to emergency room.   Increased Remeron 15mg  qhs; sleeping really well.  Dogs snuggling with patient.  Taking nerve pill 1/2 then one whole tablet then 1/2.   Did not scrape snow off with snow.   Replaced fence post with neighbor.    BP Readings from Last 3 Encounters:  11/30/17 (!) 108/58  11/16/17 102/62  11/03/17 115/66   Wt Readings from Last 3 Encounters:  11/30/17 147 lb (66.7 kg)  11/16/17 148 lb (67.1 kg)  11/03/17 149 lb 12.8 oz (67.9 kg)   Immunization History  Administered Date(s) Administered  . Influenza Whole 09/19/2012  . Influenza,inj,Quad PF,6+ Mos 10/24/2013, 11/25/2014, 03/02/2016, 10/27/2016, 10/05/2017  . Pneumococcal Conjugate-13 11/25/2014  . Pneumococcal Polysaccharide-23 08/24/2016, 10/27/2016  . Pneumococcal-Unspecified 12/20/2008  . Tdap 12/20/2008    Review of Systems  Constitutional: Negative for activity change, appetite change, chills, diaphoresis, fatigue and fever.  Respiratory: Positive for shortness of breath. Negative for cough.   Cardiovascular: Positive for chest pain. Negative for palpitations and leg swelling.  Gastrointestinal:  Negative for abdominal pain, diarrhea, nausea and vomiting.  Endocrine: Negative for cold intolerance, heat intolerance, polydipsia, polyphagia and polyuria.  Skin: Negative for color change, rash and wound.  Neurological: Negative for dizziness, tremors, seizures, syncope, facial asymmetry, speech difficulty, weakness, light-headedness, numbness and headaches.  Psychiatric/Behavioral: Positive for dysphoric mood. Negative for self-injury, sleep disturbance and suicidal ideas. The patient is nervous/anxious.     Past Medical History:  Diagnosis Date  . Allergic rhinitis   . Anxiety   . Atherosclerosis   . Back pain   . Bronchitis    09-14-11  . CARDIOMYOPATHY 03/24/2010   did not tolerate Toprol and Lisinopril.   . Chest pain   . Chronic airway obstruction, not elsewhere classified   . Colon polyps 08/2012   colonoscopy; multiple colon polyps; repeat colonoscopy in one year.  [name] COPD (chronic obstructive pulmonary disease) ([place])   . Coronary artery disease 11/2009   Anterior MI with late presentation 11/2009. Cath showed a 100% proximal LAD occlusion, 99% mid RCA. Had a PCI and 2 DES to both LAD and RCA. EF was 35%. Nuclear stress test 05/13: prior anterior/inferior infarcts without ischemic, EF 43%, cardiac cath in 02/14: Widely patent stents with no other obstructive disease. EF 40%   . Depression   . Diabetes mellitus without complication ([place])   . GERD (gastroesophageal reflux disease)   . Headache(784.0)   . Helicobacter pylori (H. pylori)   . Hemorrhoids   . Hernia    inguinal  . Hyperlipidemia   . Hypertension   . Insomnia   . MI (myocardial infarction) ([place])   .  Myalgia   . Peptic ulcer   . Shortness of breath dyspnea   . Status post dilation of esophageal narrowing 2000  . Thyroid dysfunction   . Tinea pedis   . Wears dentures    partial upper   Past Surgical History:  Procedure Laterality Date  . Admission  12/20/2012   COPD exacerbation.  [place].  [name] CARDIAC  CATHETERIZATION    . CARDIAC CATHETERIZATION  02/05/13   [facility]  . CARDIAC CATHETERIZATION  10/14   [facility] : patent stents with no change in anatomy. EF: 40$  . CARDIAC CATHETERIZATION Left 11/12/2016   Procedure: Left Heart Cath and Coronary Angiography;  Surgeon: Wellington Hampshire, MD;  Location: [place] [facility];  Service: Cardiovascular;  Laterality: Left;  . COLONOSCOPY    . COLONOSCOPY WITH PROPOFOL N/A 05/18/2016   Procedure: COLONOSCOPY WITH PROPOFOL;  Surgeon: Lucilla Lame, MD;  Location: [facility] ENDOSCOPY;  Service: Endoscopy;  Laterality: N/A;  . CORONARY ANGIOPLASTY WITH STENT PLACEMENT  09/2009   LAD 3.0 X23 mm Xience DES, RCA: 4.0 X 15 mm Xience DES  . ELECTROMAGNETIC NAVIGATION BROCHOSCOPY N/A 10/27/2017   Procedure: ELECTROMAGNETIC NAVIGATION BRONCHOSCOPY;  Surgeon: Flora Lipps, MD;  Location: [facility] ORS;  Service: Cardiopulmonary;  Laterality: N/A;  . ESOPHAGEAL DILATION    . ESOPHAGOGASTRODUODENOSCOPY  2008  . ESOPHAGOGASTRODUODENOSCOPY  08/20/2012  . HERNIA REPAIR  07/20/2012   L inguinal hernia repair  . PENILE PROSTHESIS IMPLANT     Allergies  Allergen Reactions  . Effexor Xr [Venlafaxine Hcl Er]   . Wellbutrin [Bupropion] Other (See Comments)    Unknown/"made me feel real funny"   Current Outpatient Medications on File Prior to Visit  Medication Sig Dispense Refill  . albuterol (PROVENTIL) (2.5 MG/3ML) 0.083% nebulizer solution USE 1 VIAL (3 ML) BY NEBULIZATION EVERY 6 HOURS AS NEEDED FOR WHEEZING OR SHORTNESS OF BREATH 1080 mL 3  . albuterol (VENTOLIN HFA) 108 (90 Base) MCG/ACT inhaler USE 2 INHALATIONS EVERY 6 HOURS AS NEEDED FOR WHEEZING 48 g 3  . aspirin 81 MG tablet Take 81 mg by mouth daily.      [name] atorvastatin (LIPITOR) 40 MG tablet Take 1 tablet (40 mg total) by mouth daily. 90 tablet 3  . azelastine (ASTELIN) 0.1 % nasal spray Place 2 sprays into both nostrils 2 (two) times daily. Use in each nostril as directed 30 mL 12  . fluticasone (FLONASE) 50 MCG/ACT nasal  spray Place 1 spray into both nostrils 2 (two) times daily. 48 g 3  . Fluticasone-Salmeterol (ADVAIR DISKUS) 500-50 MCG/DOSE AEPB Inhale 1 puff into the lungs 2 (two) times daily. 180 each 3  . ipratropium-albuterol (DUONEB) 0.5-2.5 (3) MG/3ML SOLN INHALE 1 VIAL BY NEBULIZER EVERY 6 HOURS AS NEEDED 270 mL 3  . ketoconazole (NIZORAL) 2 % cream Apply 1 application topically 2 (two) times daily. 60 g 0  . levalbuterol (XOPENEX) 1.25 MG/3ML nebulizer solution Inhale into the lungs.    [name] LORazepam (ATIVAN) 0.5 MG tablet Take 1 tablet (0.5 mg total) by mouth every 6 (six) hours as needed. for anxiety 120 tablet 2  . losartan (COZAAR) 25 MG tablet Take 1 tablet (25 mg total) by mouth daily. 90 tablet 3  . mirtazapine (REMERON) 15 MG tablet Take 1 tablet (15 mg total) by mouth at bedtime. 30 tablet 5  . Multiple Vitamin (MULTIVITAMIN) capsule Take 1 capsule by mouth daily.    . nitroGLYCERIN (NITROSTAT) 0.4 MG SL tablet Place 1 tablet (0.4 mg total) under the  tongue every 5 (five) minutes as needed. 25 tablet 3  . sucralfate (CARAFATE) 1 GM/10ML suspension Take 10 mLs (1 g total) by mouth 4 (four) times daily -  with meals and at bedtime. 420 mL 4  . tiotropium (SPIRIVA HANDIHALER) 18 MCG inhalation capsule Place 1 capsule (18 mcg total) into inhaler and inhale daily. 90 capsule 3  . furosemide (LASIX) 20 MG tablet Take 1 tablet (20 mg total) by mouth daily. 90 tablet 1  . sodium chloride (OCEAN) 0.65 % SOLN nasal spray Place 2 sprays into both nostrils every 2 (two) hours while awake. 88 mL 5   No current facility-administered medications on file prior to visit.    Social History   Socioeconomic History  . Marital status: Widowed    Spouse name: Not on file  . Number of children: 5  . Years of education: Not on file  . Highest education level: Not on file  Social Needs  . Financial resource strain: Not on file  . Food insecurity - worry: Not on file  . Food insecurity - inability: Not on file    . Transportation needs - medical: Not on file  . Transportation needs - non-medical: Not on file  Occupational History  . 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 Smoking status: Current Every Day Smoker    Packs/day: 1.50    Years: 42.00    Pack years: 63.00    Types: Cigarettes  . Smokeless tobacco: Never Used  . Tobacco comment: smoking 15-18 cigarettes daily  Substance and Sexual Activity  . Alcohol use: No    Alcohol/week: 0.0 oz  . Drug use: No  . Sexual activity: Yes  Other Topics Concern  . Not on file  Social History Narrative   Marital status: married x 32 years; happily married.      Children: 4 children;  1 stepchild and 15 grandchildren; no great grandchildren.      Lives: with wife; 2 dogs, 1 cat.      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      Tobacco: 1 ppd x 45 years      Alcohol: none; previous alcoholism      Drugs: none      Exercise: no formal exercise; physically demanding job; owns horses.      Seatbelts:  100%      Guns: one loaded unsecured gun in the home.      Sunscreen: none   Family History  Problem Relation Age of Onset  . Heart attack Brother        Brother #1  . Diabetes Brother   . Hypertension Brother        #3  . Coronary artery disease Father 58       deceased  . Heart attack Father   . Diabetes Father   . Heart disease Father   . COPD Mother 8       deceased  . Alcohol abuse Sister        polysubstance abuse  . COPD Sister   . Lung cancer Sister   . Alcohol abuse Sister        polysubstance abuse  . Penile cancer Brother   . Diabetes Brother        Objective:    BP (!) 108/58   Pulse 86   Temp 98.5 F (36.9 C) (Oral)   Resp 16   Ht 5' 9.49" (1.765  m)   Wt 147 lb (66.7 kg)   SpO2 93%   BMI 21.40 kg/m  Physical Exam  Constitutional: He is oriented to person, place, and time. He appears well-developed and well-nourished. No distress.  HENT:   Head: Normocephalic and atraumatic.  Right Ear: External ear normal.  Left Ear: External ear normal.  Nose: Nose normal.  Mouth/Throat: Oropharynx is clear and moist.  Eyes: Conjunctivae and EOM are normal. Pupils are equal, round, and reactive to light.  Neck: Normal range of motion. Neck supple. Carotid bruit is not present. No thyromegaly present.  Cardiovascular: Normal rate, regular rhythm, normal heart sounds and intact distal pulses. Exam reveals no gallop and no friction rub.  No murmur heard. Pulmonary/Chest: Effort normal and breath sounds normal. He has no wheezes. He has no rales.  Abdominal: Soft. Bowel sounds are normal. He exhibits no distension and no mass. There is no tenderness. There is no rebound and no guarding.  Lymphadenopathy:    He has no cervical adenopathy.  Neurological: He is alert and oriented to person, place, and time. No cranial nerve deficit. He exhibits normal muscle tone. Coordination normal.  Skin: Skin is warm and dry. No rash noted. He is not diaphoretic.  Psychiatric: He has a normal mood and affect. His behavior is normal. Judgment and thought content normal.  Nursing note and vitals reviewed.  No results found. Depression screen [facility] 2/9 11/16/2017 10/05/2017 09/07/2017 08/19/2017 07/27/2017  Decreased Interest 2 1 2 3 3   Down, Depressed, Hopeless 2 1 2 2 3   PHQ - 2 Score 4 2 4 5 6   Altered sleeping 1 0 2 3 3   Tired, decreased energy 1 1 2 3 3   Change in appetite 2 0 2 2 3   Feeling bad or failure about yourself  1 1 2 1 1   Trouble concentrating 0 0 2 2 3   Moving slowly or fidgety/restless 0 0 0 1 2  Suicidal thoughts 0 0 0 0 0  PHQ-9 Score 9 4 14 17 21   Difficult doing work/chores - - - - Somewhat difficult  Some recent data might be hidden   Fall Risk  11/16/2017 10/05/2017 09/07/2017 08/19/2017 07/27/2017  Falls in the past year? No No No No No        Assessment & Plan:   1. Other chest pain   2. S/P bronchoscopy with biopsy   3.  DEPRESSION/ANXIETY   4. COPD, very severe ([place])   5. Atherosclerosis of native coronary artery of native heart without angina pectoris   6. Right upper lobe pulmonary nodule   7. Left upper lobe pulmonary nodule   8. SMOKER     Persistent left-sided chest pain despite prednisone and doxycycline therapy.  Status post bronchoscopy with constant left anterior chest pain.  Benign exam.  Stable pulse oximetry in the office and at home.  Recommend patient contacting pulmonologist regarding ongoing left-sided chest pain following bronchoscopy.  Status post chest x-ray at last visit that was negative for acute pathology.  Depression anxiety slightly improved today despite the holiday season.  Improve compliance with Remeron and sertraline.  Has decreased lorazepam dose during the day to decrease hypersomnolence.  Recommend decreasing lorazepam to 1/2 tablet 3 times daily.  Highly encouraged psychotherapy through hospice.  Encourage patient to remain active in getting out of the house daily.  COPD is stable today.  No changes in therapy.  New onset bilateral upper lobe pulmonary nodules.  Status post PET CT scan.  Status  post biopsy via bronchoscopy.  Pathology negative.  Will warrant repeat CT scans in 3 months.  Followed by pulmonology and oncology team at [facility].  Highly encourage smoking cessation.  No orders of the defined types were placed in this encounter.  Meds ordered this encounter  Medications  . DISCONTD: sertraline (ZOLOFT) 50 MG tablet    Sig: Take 1 tablet (50 mg total) by mouth at bedtime.    Dispense:  90 tablet    Refill:  1  . sertraline (ZOLOFT) 100 MG tablet    Sig: Take 1 tablet (100 mg total) by mouth at bedtime.    Dispense:  90 tablet    Refill:  1    Return in about 4 weeks (around 12/28/2017) for follow-up chronic medical conditions.   Trinidi Toppins Elayne Guerin, M.D. Primary Care at [facility] previously [facility] [address] [place],   [address] [phone] phone [phone] fax

## 2017-12-01 ENCOUNTER — Telehealth: Payer: Self-pay | Admitting: *Deleted

## 2017-12-01 NOTE — Telephone Encounter (Signed)
Thank you :)

## 2017-12-01 NOTE — Telephone Encounter (Signed)
Pt called in to report that is having burning sensation to left side of chest which started after bronchoscopy. Pt states has followed up with pulmonary and PCP and has completed rounds of steroids and antibiotics. Pt is calling to get advice from provider at cancer center as to what is causing the burning sensation and if anything can be done to relieve it. Pt states has been wearing oxygen at night and asks if that could cause the burning sensation. States he has been more short of breath recently and has a slight productive cough at times but no reports of fever. Advised pt to try adding moisture to home oxygen when wearing at night since that might cause his airway to become dry. Instructed pt to try that for 1 week and to call back if not better to discuss with Dr. Grayland Ormond if CT scan needs to be scheduled sooner. Pt verbalized understanding.  Pt currently scheduled for CT chest on 02/01/18 and follow up with Dr. Grayland Ormond on 02/07/18.

## 2017-12-01 NOTE — Telephone Encounter (Signed)
Agreed and thank you.  We can do a PE protocol CT is no improvement after a week like you instructed.

## 2017-12-07 ENCOUNTER — Ambulatory Visit
Admission: RE | Admit: 2017-12-07 | Discharge: 2017-12-07 | Disposition: A | Discharge status: Home / Self Care | Payer: BLUE CROSS/BLUE SHIELD | Source: Ambulatory Visit | Attending: Oncology | Admitting: Oncology

## 2017-12-07 ENCOUNTER — Telehealth: Payer: Self-pay | Admitting: *Deleted

## 2017-12-07 DIAGNOSIS — J439 Emphysema, unspecified: Secondary | ICD-10-CM | POA: Insufficient documentation

## 2017-12-07 DIAGNOSIS — R918 Other nonspecific abnormal finding of lung field: Secondary | ICD-10-CM

## 2017-12-07 DIAGNOSIS — R0602 Shortness of breath: Secondary | ICD-10-CM | POA: Insufficient documentation

## 2017-12-07 DIAGNOSIS — D3501 Benign neoplasm of right adrenal gland: Secondary | ICD-10-CM | POA: Diagnosis not present

## 2017-12-07 HISTORY — DX: Heart failure, unspecified: I50.9

## 2017-12-07 IMAGING — CT CT ANGIO CHEST
2 of 7 series · 18 of 46 positions shown · IV contrast (APPLIED)
Comparison: [DATE] CT of the chest.

CLINICAL DATA: 66 y/o M; bilateral lung nodules. Burning sensation
in left-sided chest with shortness of breath. Current smoker with
COPD.

EXAM:
CT ANGIOGRAPHY CHEST WITH CONTRAST
TECHNIQUE: Multidetector CT imaging of the chest was performed using the
standard protocol during bolus administration of intravenous
contrast. Multiplanar CT image reconstructions and MIPs were
obtained to evaluate the vascular anatomy.
CONTRAST:  75mL [TD] IOPAMIDOL ([TD]) INJECTION 76%

[Series 5: thins · axial · 0.75mm/px · z∈[-252,+71]mm · 15 of 363 slices shown]
[im 20/363  lung]
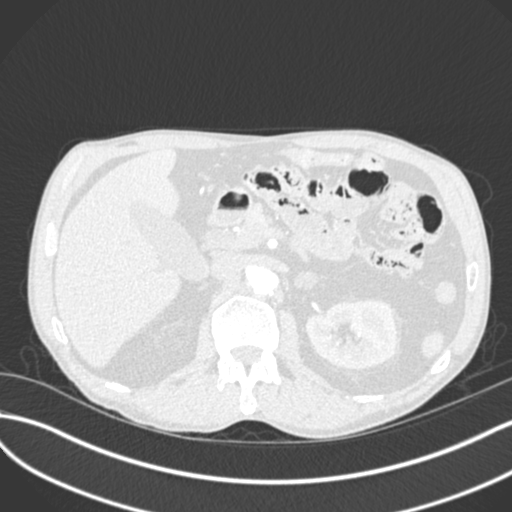
[im 39/363  soft-tissue]
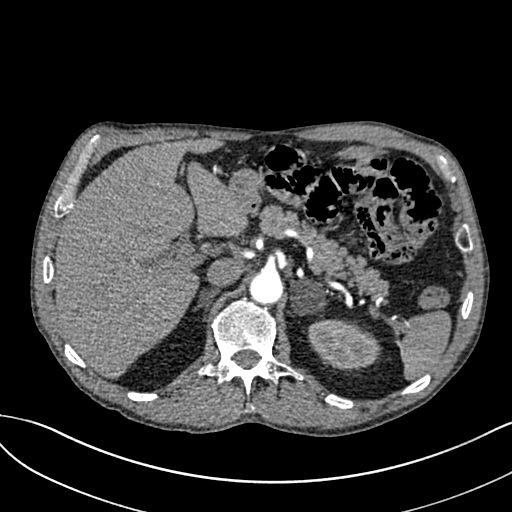
[im 77/363  lung]
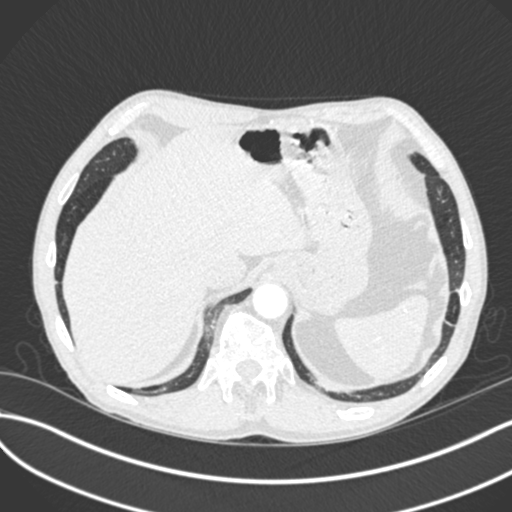
[im 96/363  soft-tissue]
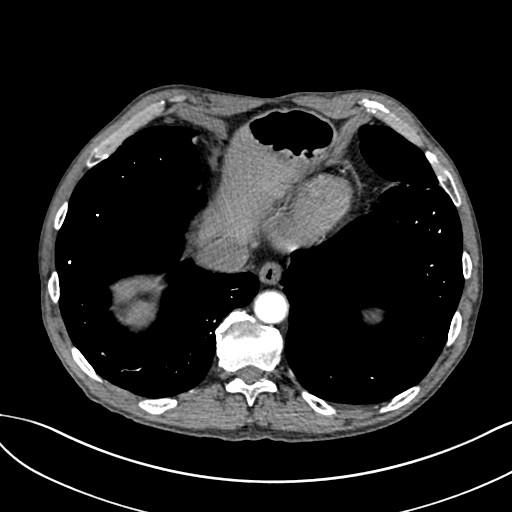
[im 115/363  lung]
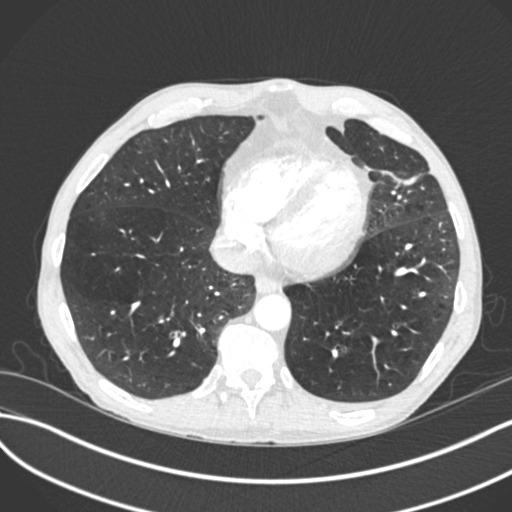
[im 134/363  soft-tissue]
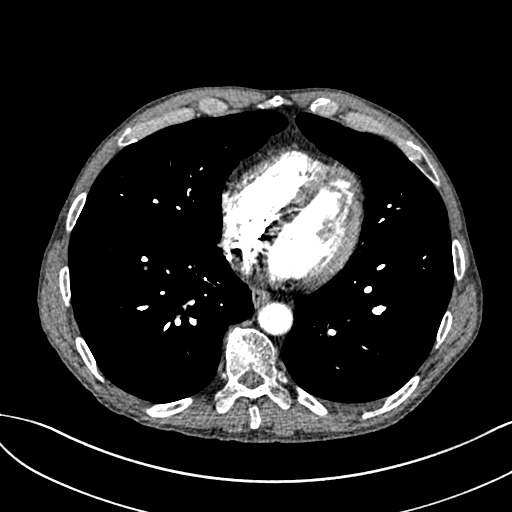
[im 153/363  lung]
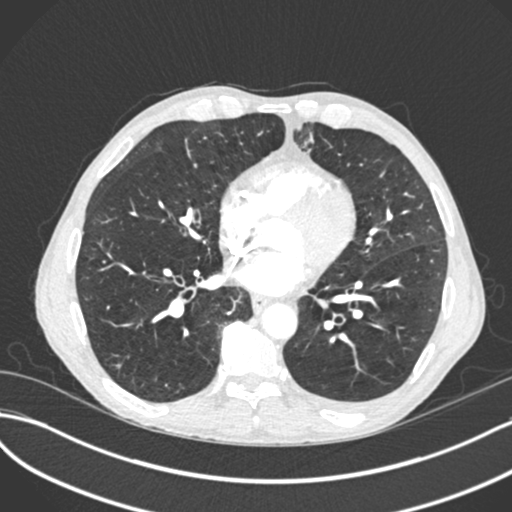
[im 191/363  soft-tissue]
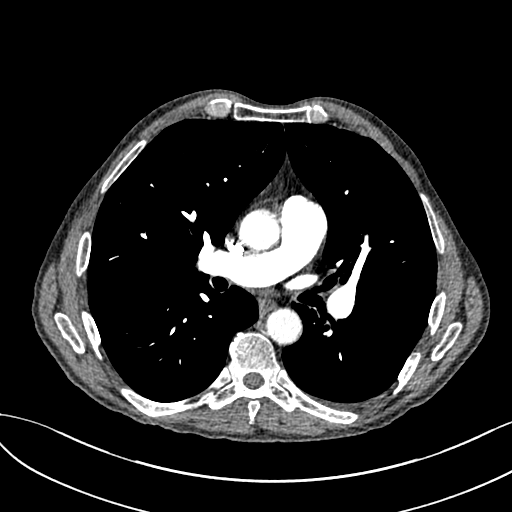
[im 210/363  lung]
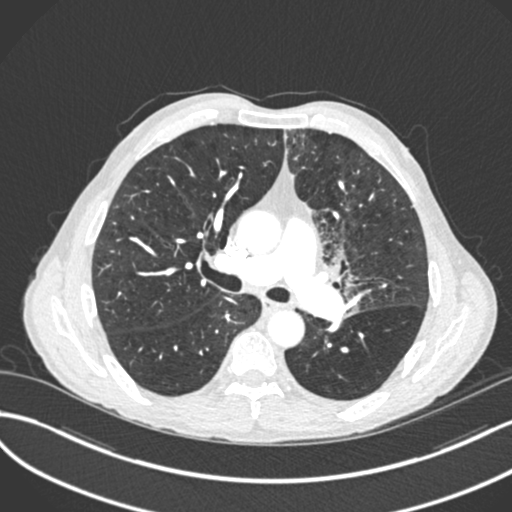
[im 229/363  soft-tissue]
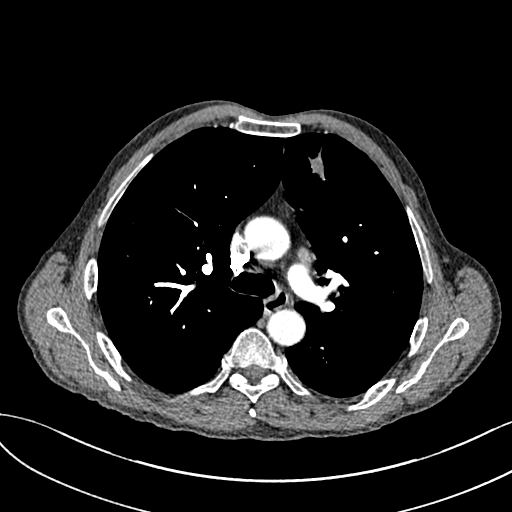
[im 248/363  lung]
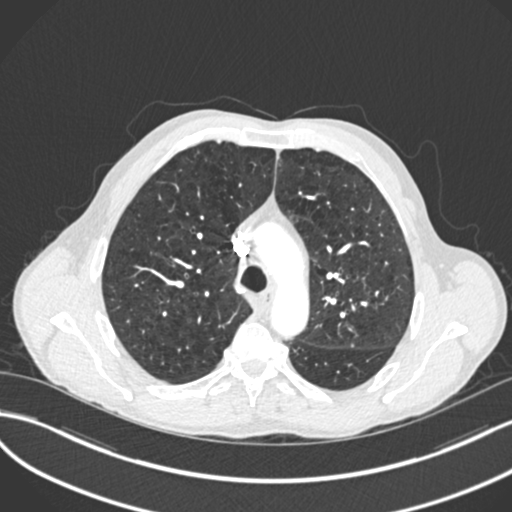
[im 267/363  soft-tissue]
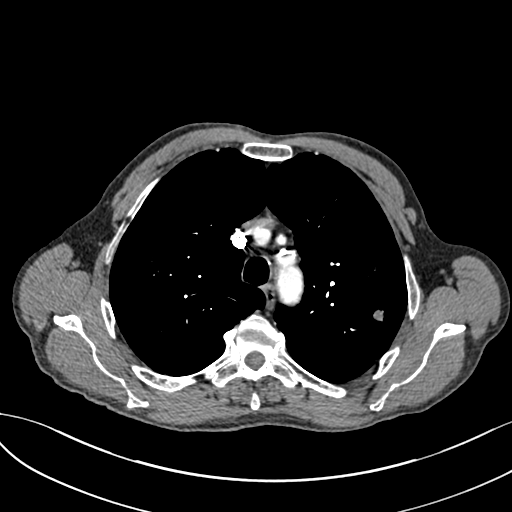
[im 305/363  lung]
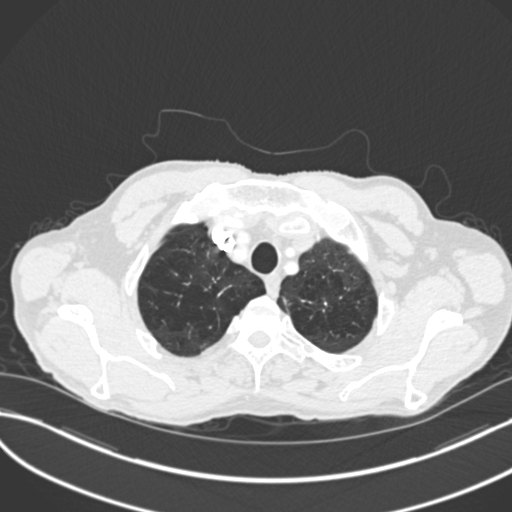
[im 324/363  soft-tissue]
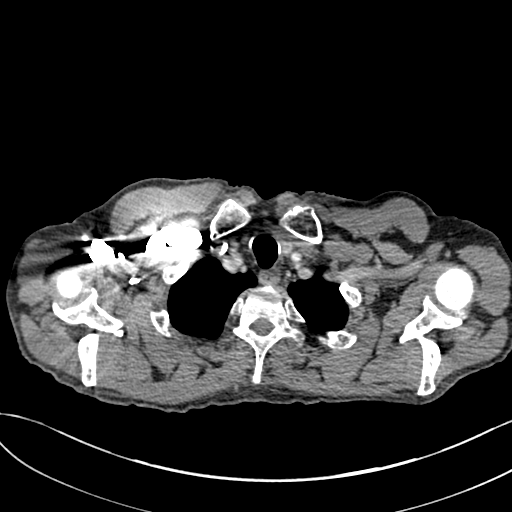
[im 343/363  lung]
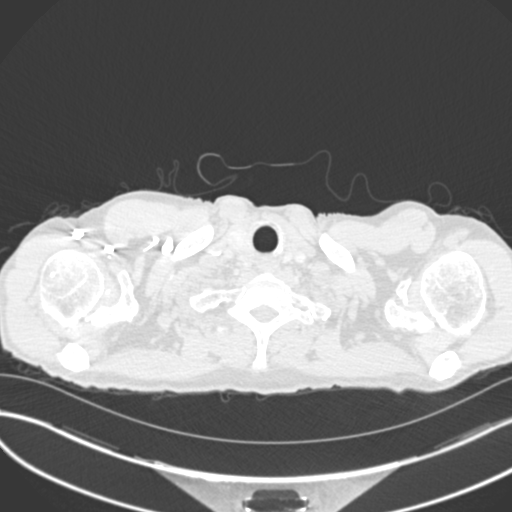

[Series 7: coronal mpr · coronal · 0.69mm/px · 3 of 101 slices shown]
[im 26/101  soft-tissue]
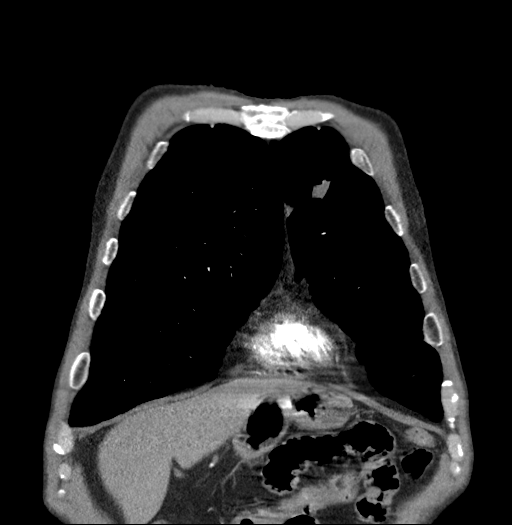
[im 51/101  soft-tissue]
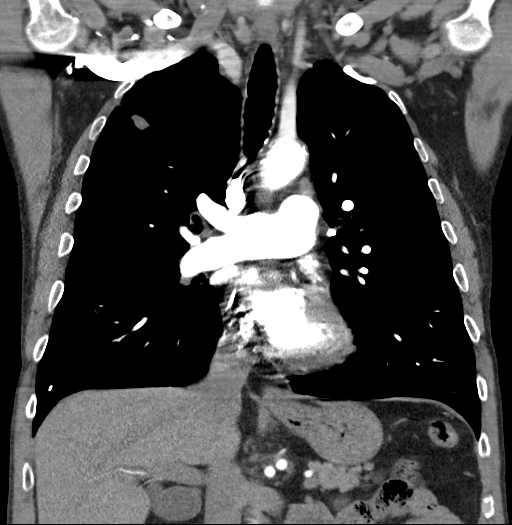
[im 76/101  soft-tissue]
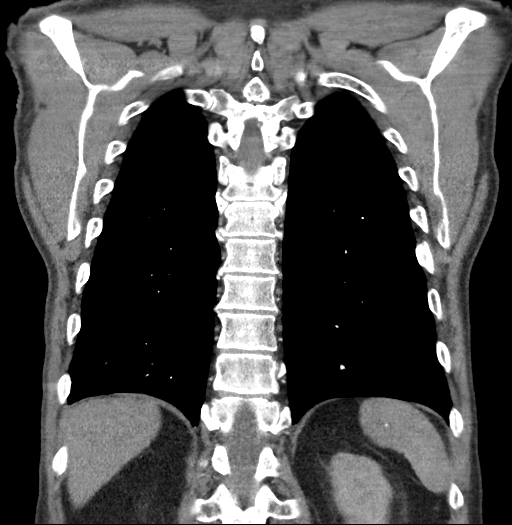

[18 of 46 positions shown; findings below may reference images not displayed]

FINDINGS: Cardiovascular: Satisfactory opacification of the pulmonary arteries
to the segmental level. No evidence of pulmonary embolism. Normal
heart size. No pericardial effusion. Normal caliber thoracic aorta
with mild calcific atherosclerosis. Moderate coronary artery
calcification.

Mediastinum/Nodes: No enlarged mediastinal, hilar, or axillary lymph
nodes. Thyroid gland, trachea, and esophagus demonstrate no
significant findings.

Lungs/Pleura: New ground-glass opacity with nodular consolidation in
the anterior left upper lobe (series 6, image 45) probably
representing an area of developing pneumonitis.

Stable right upper lobe 15 mm pulmonary nodule (series 6, image 29).
Stable left upper lobe 14 mm pulmonary nodule (series 6, image 32).
Stable additional subcentimeter pulmonary nodules in left lung apex
(series 6, image 18) and left lower lobe superior segment (series 6,
image 38 and 48).

Stable centrilobular emphysema greatest in upper lobes. Scattered
calcified granulomas.

Upper Abdomen: 13 mm right and 27 mm left adrenal nodules measuring
-3 HU compatible with adenoma.

Musculoskeletal: No chest wall abnormality. No acute or significant
osseous findings.

Review of the MIP images confirms the above findings.
IMPRESSION: 1. No pulmonary embolus identified.
2. Anterior left upper lobe ground-glass and nodular consolidation
likely representing pneumonitis.
3. Stable bilateral upper lobe pulmonary nodules.
4. Stable emphysema.
5. Stable bilateral adrenal adenoma.

By: IVONNE M.D.

## 2017-12-07 MED ORDER — IOPAMIDOL (ISOVUE-370) INJECTION 76%
75.0000 mL | Freq: Once | INTRAVENOUS | Status: AC | PRN
  Administered 2017-12-07: 75 mL via INTRAVENOUS

## 2017-12-07 MED ORDER — METHYLPREDNISOLONE 4 MG PO TBPK: ORAL_TABLET | ORAL | 0 refills | Status: DC

## 2017-12-07 NOTE — Telephone Encounter (Signed)
Pt called in to report that burning to left side of chest is not better and is having increasing shortness of breath with exertion. Spoke with Dr. Grayland Ormond who advised that pt be scheduled for STAT CT angio for PE protocol. Message sent to scheduling and pt notified with appt. Informed pt that will followup with him regarding results once report is available. Pt verbalized understanding.

## 2017-12-07 NOTE — Addendum Note (Signed)
Addended by: Telford Nab on: 12/07/2017 04:01 PM   Modules accepted: Orders

## 2017-12-07 NOTE — Telephone Encounter (Signed)
Pt scheduled for CT scan at 2:30pm today. Pt instructed to arrive at 2:15pm and instructed to not eat or drink anything until after his CT scan. Informed pt to expect follow up phone call this afternoon or early in the morning with results. Pt verbalized understanding.

## 2017-12-07 NOTE — Telephone Encounter (Signed)
Dr. Grayland Ormond reviewed CT scan results and recommended for pt to take medrol dose pak. Pt notified of results and MD recommendations. Pt in agreement and asked that prescription be sent into [facility]. rx has been escribed. Pt instructed to call back if not better in the next couple of weeks. Pt verbalized understanding.

## 2017-12-15 ENCOUNTER — Telehealth: Payer: Self-pay | Admitting: *Deleted

## 2017-12-15 ENCOUNTER — Telehealth: Payer: Self-pay | Admitting: Pulmonary Disease

## 2017-12-15 NOTE — Telephone Encounter (Signed)
Pt called in to report that continues to have burning to left side of chest with shortness of breath. Pt states has finished medrol dose pak. Per Dr. Grayland Ormond, it was recommended that pt follow up with pulmonologist and/or cardiologist to further manage symptoms. Informed pt of MD recommendations. Pt verbalized understanding and stated will give their office a call. Nothing further needed at this time.

## 2017-12-15 NOTE — Telephone Encounter (Signed)
Pt calling stating since the nodule removal he's been having some burning and pains in his chest  He called cancer center and they advised him to call us  He states he is not sure what is causing this   Would like to know if we can help.   Please advise

## 2017-12-15 NOTE — Telephone Encounter (Signed)
Called patient and went over common side effects after a bronchoscopy. Chest pain and or discomfort are not normal side effects. Patient needs to go to ED or Urgent care. Patient verbalizes understanding.

## 2017-12-16 ENCOUNTER — Other Ambulatory Visit: Payer: Self-pay

## 2017-12-16 ENCOUNTER — Encounter: Payer: Self-pay | Admitting: *Deleted

## 2017-12-16 ENCOUNTER — Ambulatory Visit
Admission: EM | Admit: 2017-12-16 | Discharge: 2017-12-16 | Disposition: A | Discharge status: Home / Self Care | Payer: BLUE CROSS/BLUE SHIELD | Attending: Family Medicine | Admitting: Family Medicine

## 2017-12-16 DIAGNOSIS — J441 Chronic obstructive pulmonary disease with (acute) exacerbation: Secondary | ICD-10-CM

## 2017-12-16 DIAGNOSIS — R0602 Shortness of breath: Secondary | ICD-10-CM | POA: Diagnosis not present

## 2017-12-16 MED ORDER — PREDNISONE 20 MG PO TABS: ORAL_TABLET | ORAL | 0 refills | Status: DC

## 2017-12-16 NOTE — ED Provider Notes (Signed)
[facility]    CSN: [id number] Arrival date & time: 12/16/17  1359     History   Chief Complaint Chief Complaint  Patient presents with  . Shortness of Breath    HPI Leslie Sims is a 66 y.o. male.   66 yo male with a c/o chronic cough and chronic shortness of breath for months. Patient has a h/o emphysema, pulmonary nodules, lung biopsy 6 weeks ago and is being followed by pulmonology. No new complaints. States he finished a round of prednisone, which helped him, earlier this week. Also recently finished a course of antibiotic. Using inhalers. Denies any fevers or chills.    The history is provided by the patient.  Shortness of Breath    Past Medical History:  Diagnosis Date  . Allergic rhinitis   . Anxiety   . Atherosclerosis   . Back pain   . Bronchitis    09-14-11  . CARDIOMYOPATHY 03/24/2010   did not tolerate Toprol and Lisinopril.   . Chest pain   . CHF (congestive heart failure) ([place])   . Chronic airway obstruction, not elsewhere classified   . Colon polyps 08/2012   colonoscopy; multiple colon polyps; repeat colonoscopy in one year.  [name] COPD (chronic obstructive pulmonary disease) ([place])   . Coronary artery disease 11/2009   Anterior MI with late presentation 11/2009. Cath showed a 100% proximal LAD occlusion, 99% mid RCA. Had a PCI and 2 DES to both LAD and RCA. EF was 35%. Nuclear stress test 05/13: prior anterior/inferior infarcts without ischemic, EF 43%, cardiac cath in 02/14: Widely patent stents with no other obstructive disease. EF 40%   . Depression   . Diabetes mellitus without complication ([place])   . GERD (gastroesophageal reflux disease)   . Headache(784.0)   . Helicobacter pylori (H. pylori)   . Hemorrhoids   . Hernia    inguinal  . Hyperlipidemia   . Hypertension   . Insomnia   . MI (myocardial infarction) ([place])   . Myalgia   . Peptic ulcer   . Shortness of breath dyspnea   . Status post dilation of esophageal narrowing  2000  . Thyroid dysfunction   . Tinea pedis   . Wears dentures    partial upper    Patient Active Problem List   Diagnosis Date Noted  . Unstable angina ([place])   . Benign neoplasm of ascending colon   . Benign neoplasm of descending colon   . Benign neoplasm of sigmoid colon   . Personal history of colonic polyps   . Left upper lobe pulmonary nodule 01/05/2015  . Bruit of left carotid artery 11/04/2014  . Sleep disorder 07/18/2013  . Seasonal and perennial allergic rhinitis 05/29/2013  . Bradycardia 04/20/2011  . SMOKER 07/30/2010  . CAD, NATIVE VESSEL 04/23/2010  . Hyperlipidemia 03/24/2010  . DEPRESSION/ANXIETY 03/24/2010  . Chronic systolic heart failure ([place]) 03/24/2010  . COPD, very severe ([place]) 03/24/2010  . GERD 03/24/2010  . Essential hypertension 11/21/2009    Past Surgical History:  Procedure Laterality Date  . Admission  12/20/2012   COPD exacerbation.  [place].  [name] CARDIAC CATHETERIZATION    . CARDIAC CATHETERIZATION  02/05/13   [facility]  . CARDIAC CATHETERIZATION  10/14   [facility] : patent stents with no change in anatomy. EF: 40$  . CARDIAC CATHETERIZATION Left 11/12/2016   Procedure: Left Heart Cath and Coronary Angiography;  Surgeon: Wellington Hampshire, MD;  Location: [place] [facility];  Service: Cardiovascular;  Laterality: Left;  . COLONOSCOPY    . COLONOSCOPY WITH PROPOFOL N/A 05/18/2016   Procedure: COLONOSCOPY WITH PROPOFOL;  Surgeon: Lucilla Lame, MD;  Location: [facility];  Service: Endoscopy;  Laterality: N/A;  . CORONARY ANGIOPLASTY WITH STENT PLACEMENT  09/2009   LAD 3.0 X23 mm Xience DES, RCA: 4.0 X 15 mm Xience DES  . ELECTROMAGNETIC NAVIGATION BROCHOSCOPY N/A 10/27/2017   Procedure: ELECTROMAGNETIC NAVIGATION BRONCHOSCOPY;  Surgeon: Flora Lipps, MD;  Location: [facility] ORS;  Service: Cardiopulmonary;  Laterality: N/A;  . ESOPHAGEAL DILATION    . ESOPHAGOGASTRODUODENOSCOPY  2008  . ESOPHAGOGASTRODUODENOSCOPY  08/20/2012  . HERNIA REPAIR  07/20/2012   L  inguinal hernia repair  . PENILE PROSTHESIS IMPLANT         Home Medications    Prior to Admission medications   Medication Sig Start Date End Date Taking? Authorizing Provider  albuterol (VENTOLIN HFA) 108 (90 Base) MCG/ACT inhaler USE 2 INHALATIONS EVERY 6 HOURS AS NEEDED FOR WHEEZING 08/19/17  Yes [name], MD  aspirin 81 MG tablet Take 81 mg by mouth daily.     Yes [provider]  atorvastatin (LIPITOR) 40 MG tablet Take 1 tablet (40 mg total) by mouth daily. 01/15/17  Yes [name], MD  azelastine (ASTELIN) 0.1 % nasal spray Place 2 sprays into both nostrils 2 (two) times daily. Use in each nostril as directed 03/01/17  Yes [name], MD  Fluticasone-Salmeterol (ADVAIR DISKUS) 500-50 MCG/DOSE AEPB Inhale 1 puff into the lungs 2 (two) times daily. 07/27/17  Yes [name], MD  furosemide (LASIX) 20 MG tablet Take 1 tablet (20 mg total) by mouth daily. 07/27/17 12/16/17 Yes [name], MD  ipratropium-albuterol (DUONEB) 0.5-2.5 (3) MG/3ML SOLN INHALE 1 VIAL BY NEBULIZER EVERY 6 HOURS AS NEEDED 09/12/17  Yes [name], MD  ketoconazole (NIZORAL) 2 % cream Apply 1 application topically 2 (two) times daily. 08/19/17  Yes [name], MD  LORazepam (ATIVAN) 0.5 MG tablet Take 1 tablet (0.5 mg total) by mouth every 6 (six) hours as needed. for anxiety 10/05/17  Yes [name], MD  losartan (COZAAR) 25 MG tablet Take 1 tablet (25 mg total) by mouth daily. 02/17/17  Yes Minna Merritts, MD  mirtazapine (REMERON) 15 MG tablet Take 1 tablet (15 mg total) by mouth at bedtime. 10/05/17  Yes [name], MD  Multiple Vitamin (MULTIVITAMIN) capsule Take 1 capsule by mouth daily.   Yes [provider]  sertraline (ZOLOFT) 100 MG tablet Take 1 tablet (100 mg total) by mouth at bedtime. 11/30/17  Yes [name], MD  tiotropium (SPIRIVA HANDIHALER) 18 MCG inhalation capsule Place 1 capsule (18 mcg total) into inhaler and inhale daily. 07/27/17   Yes [name], MD  albuterol (PROVENTIL) (2.5 MG/3ML) 0.083% nebulizer solution USE 1 VIAL (3 ML) BY NEBULIZATION EVERY 6 HOURS AS NEEDED FOR WHEEZING OR SHORTNESS OF BREATH 05/23/17   [name], MD  fluticasone [facility]) 50 MCG/ACT nasal spray Place 1 spray into both nostrils 2 (two) times daily. 03/01/17   [name], MD  levalbuterol Penne Lash) 1.25 MG/3ML nebulizer solution Inhale into the lungs.    [provider]  methylPREDNISolone (MEDROL DOSEPAK) 4 MG TBPK tablet Take as directed. 12/07/17   Lloyd Huger, MD  nitroGLYCERIN (NITROSTAT) 0.4 MG SL tablet Place 1 tablet (0.4 mg total) under the tongue every 5 (five) minutes as needed. 10/17/17   Wellington Hampshire, MD  predniSONE (DELTASONE) 20 MG tablet 3 tabs po once, then 2 tabs po qd for 2 days, then 1 tab po qd for 2 days, then half a tab po qd x 2 days 12/16/17   Norval Gable, MD  sodium chloride (OCEAN) 0.65 % SOLN nasal spray Place 2 sprays into both nostrils every 2 (two) hours while awake. 11/18/16 10/27/17  [name], MD  sucralfate (CARAFATE) 1 GM/10ML suspension Take 10 mLs (1 g total) by mouth 4 (four) times daily -  with meals and at bedtime. 07/15/17   [name], MD    Family History Family History  Problem Relation Age of Onset  . Heart attack Brother        Brother #1  . Diabetes Brother   . Hypertension Brother        #3  . Coronary artery disease Father 23       deceased  . Heart attack Father   . Diabetes Father   . Heart disease Father   . COPD Mother 51       deceased  . Alcohol abuse Sister        polysubstance abuse  . COPD Sister   . Lung cancer Sister   . Alcohol abuse Sister        polysubstance abuse  . Penile cancer Brother   . Diabetes Brother     Social History Social History   Tobacco Use  . Smoking status: Current Every Day Smoker    Packs/day: 1.50    Years: 42.00    Pack years: 63.00    Types: Cigarettes  . Smokeless tobacco: Never Used  .  Tobacco comment: smoking 15-18 cigarettes daily  Substance Use Topics  . Alcohol use: No    Alcohol/week: 0.0 oz  . Drug use: No     Allergies   Effexor xr [venlafaxine hcl er] and Wellbutrin [bupropion]   Review of Systems Review of Systems  Respiratory: Positive for shortness of breath.      Physical Exam Triage Vital Signs ED Triage Vitals  Enc Vitals Group     BP 12/16/17 1444 117/76     Pulse Rate 12/16/17 1444 97     Resp 12/16/17 1444 20     Temp 12/16/17 1444 97.7 F (36.5 C)     Temp Source 12/16/17 1444 Oral     SpO2 12/16/17 1444 98 %     Weight --      Height --      Head Circumference --      Peak Flow --      Pain Score 12/16/17 1447 0     Pain Loc --      Pain Edu? --      Excl. in [place]? --    No data found.  Updated Vital Signs BP 117/76 (BP Location: Left Arm)   Pulse 97   Temp 97.7 F (36.5 C) (Oral)   Resp 20   SpO2 98%   Visual Acuity Right Eye Distance:   Left Eye Distance:   Bilateral Distance:    Right Eye Near:   Left Eye Near:    Bilateral Near:     Physical Exam  Constitutional: He appears well-developed and well-nourished. No distress.  Cardiovascular: Normal rate, regular rhythm and normal heart sounds.  Pulmonary/Chest: Effort normal. No stridor. No respiratory distress. He has no wheezes.  Decreased breath sounds throughout  Skin: He is not diaphoretic.  Nursing note and vitals reviewed.  UC Treatments / Results  Labs (all labs ordered are listed, but only abnormal results are displayed) Labs Reviewed - No data to display  EKG  EKG Interpretation None       Radiology No results found.  Procedures Procedures (including critical care time)  Medications Ordered in UC Medications - No data to display   Initial Impression / Assessment and Plan / UC Course  I have reviewed the triage vital signs and the nursing notes.  Pertinent labs & imaging results that were available during my care of the patient  were reviewed by me and considered in my medical decision making (see chart for details).       Final Clinical Impressions(s) / UC Diagnoses   Final diagnoses:  Shortness of breath  COPD exacerbation [facility])    ED Discharge Orders        Ordered    predniSONE (DELTASONE) 20 MG tablet     12/16/17 1528     1. diagnosis reviewed with patient 2. rx as per orders above; reviewed possible side effects, interactions, risks and benefits  3. Recommend continue home medication including inhalers and oxygen   4. Follow-up with pulmonologist next week  Controlled Substance Prescriptions [place] Controlled Substance Registry consulted? Not Applicable   Norval Gable, MD 12/16/17 [phone]

## 2017-12-16 NOTE — Discharge Instructions (Signed)
Follow up with pulmonologist next week

## 2017-12-16 NOTE — ED Triage Notes (Signed)
Patient started having symptoms of SOB, cough, and chest discomfort 6 week ago when a lung biopsy was performed.

## 2017-12-23 ENCOUNTER — Other Ambulatory Visit: Payer: Self-pay

## 2017-12-23 ENCOUNTER — Encounter: Payer: Self-pay | Admitting: Emergency Medicine

## 2017-12-23 ENCOUNTER — Emergency Department: Payer: BLUE CROSS/BLUE SHIELD

## 2017-12-23 ENCOUNTER — Emergency Department
Admission: EM | Admit: 2017-12-23 | Discharge: 2017-12-23 | Disposition: A | Discharge status: Home / Self Care | Payer: BLUE CROSS/BLUE SHIELD | Attending: Emergency Medicine | Admitting: Emergency Medicine

## 2017-12-23 DIAGNOSIS — I252 Old myocardial infarction: Secondary | ICD-10-CM | POA: Insufficient documentation

## 2017-12-23 DIAGNOSIS — F17210 Nicotine dependence, cigarettes, uncomplicated: Secondary | ICD-10-CM | POA: Insufficient documentation

## 2017-12-23 DIAGNOSIS — I110 Hypertensive heart disease with heart failure: Secondary | ICD-10-CM | POA: Diagnosis not present

## 2017-12-23 DIAGNOSIS — F410 Panic disorder [episodic paroxysmal anxiety] without agoraphobia: Secondary | ICD-10-CM | POA: Insufficient documentation

## 2017-12-23 DIAGNOSIS — J449 Chronic obstructive pulmonary disease, unspecified: Secondary | ICD-10-CM | POA: Diagnosis not present

## 2017-12-23 DIAGNOSIS — I2510 Atherosclerotic heart disease of native coronary artery without angina pectoris: Secondary | ICD-10-CM | POA: Diagnosis not present

## 2017-12-23 DIAGNOSIS — R0602 Shortness of breath: Secondary | ICD-10-CM | POA: Diagnosis present

## 2017-12-23 DIAGNOSIS — I5022 Chronic systolic (congestive) heart failure: Secondary | ICD-10-CM | POA: Diagnosis not present

## 2017-12-23 DIAGNOSIS — E119 Type 2 diabetes mellitus without complications: Secondary | ICD-10-CM | POA: Insufficient documentation

## 2017-12-23 DIAGNOSIS — R0600 Dyspnea, unspecified: Secondary | ICD-10-CM

## 2017-12-23 LAB — BASIC METABOLIC PANEL
ANION GAP: 9 (ref 5–15)
BUN: 13 mg/dL (ref 6–20)
CALCIUM: 9.3 mg/dL (ref 8.9–10.3)
CO2: 35 mmol/L — AB (ref 22–32)
Chloride: 97 mmol/L — ABNORMAL LOW (ref 101–111)
Creatinine, Ser: 0.82 mg/dL (ref 0.61–1.24)
GFR calc Af Amer: >60 mL/min (ref >60)
GFR calc non Af Amer: >60 mL/min (ref >60)
GLUCOSE: 125 mg/dL — AB (ref 65–99)
POTASSIUM: 3.7 mmol/L (ref 3.5–5.1)
Sodium: 141 mmol/L (ref 135–145)

## 2017-12-23 LAB — CBC WITH DIFFERENTIAL/PLATELET
BASOS ABS: 0 10*3/uL (ref 0–0.1)
Basophils Relative: 0 %
Eosinophils Absolute: 0.5 10*3/uL (ref 0–0.7)
Eosinophils Relative: 3 %
HCT: 43.9 % (ref 40.0–52.0)
Hemoglobin: 14.4 g/dL (ref 13.0–18.0)
LYMPHS ABS: 1.2 10*3/uL (ref 1.0–3.6)
LYMPHS PCT: 7 %
MCH: 30.2 pg (ref 26.0–34.0)
MCHC: 32.8 g/dL (ref 32.0–36.0)
MCV: 92 fL (ref 80.0–100.0)
MONO ABS: 1.8 10*3/uL — AB (ref 0.2–1.0)
Monocytes Relative: 11 %
Neutro Abs: 13.5 10*3/uL — ABNORMAL HIGH (ref 1.4–6.5)
Neutrophils Relative %: 79 %
Platelets: 231 10*3/uL (ref 150–440)
RBC: 4.77 MIL/uL (ref 4.40–5.90)
RDW: 13.5 % (ref 11.5–14.5)
WBC: 17.1 10*3/uL — ABNORMAL HIGH (ref 3.8–10.6)

## 2017-12-23 LAB — BRAIN NATRIURETIC PEPTIDE: B Natriuretic Peptide: 49 pg/mL (ref 0.0–100.0)

## 2017-12-23 LAB — TROPONIN I: Troponin I: 0.03 ng/mL (ref <0.03)

## 2017-12-23 IMAGING — CR DG CHEST 2V
1 series · 2 of 2 positions shown · non-contrast
Comparison: [DATE] and [DATE]

CLINICAL DATA: Shortness of breath. Smoker. COPD. Diabetes and
coronary artery disease.

EXAM:
CHEST  2 VIEW

[Series 1: dg chest 2 view · 0.14mm/px · 2 of 2 slices shown]
[im 1/2]
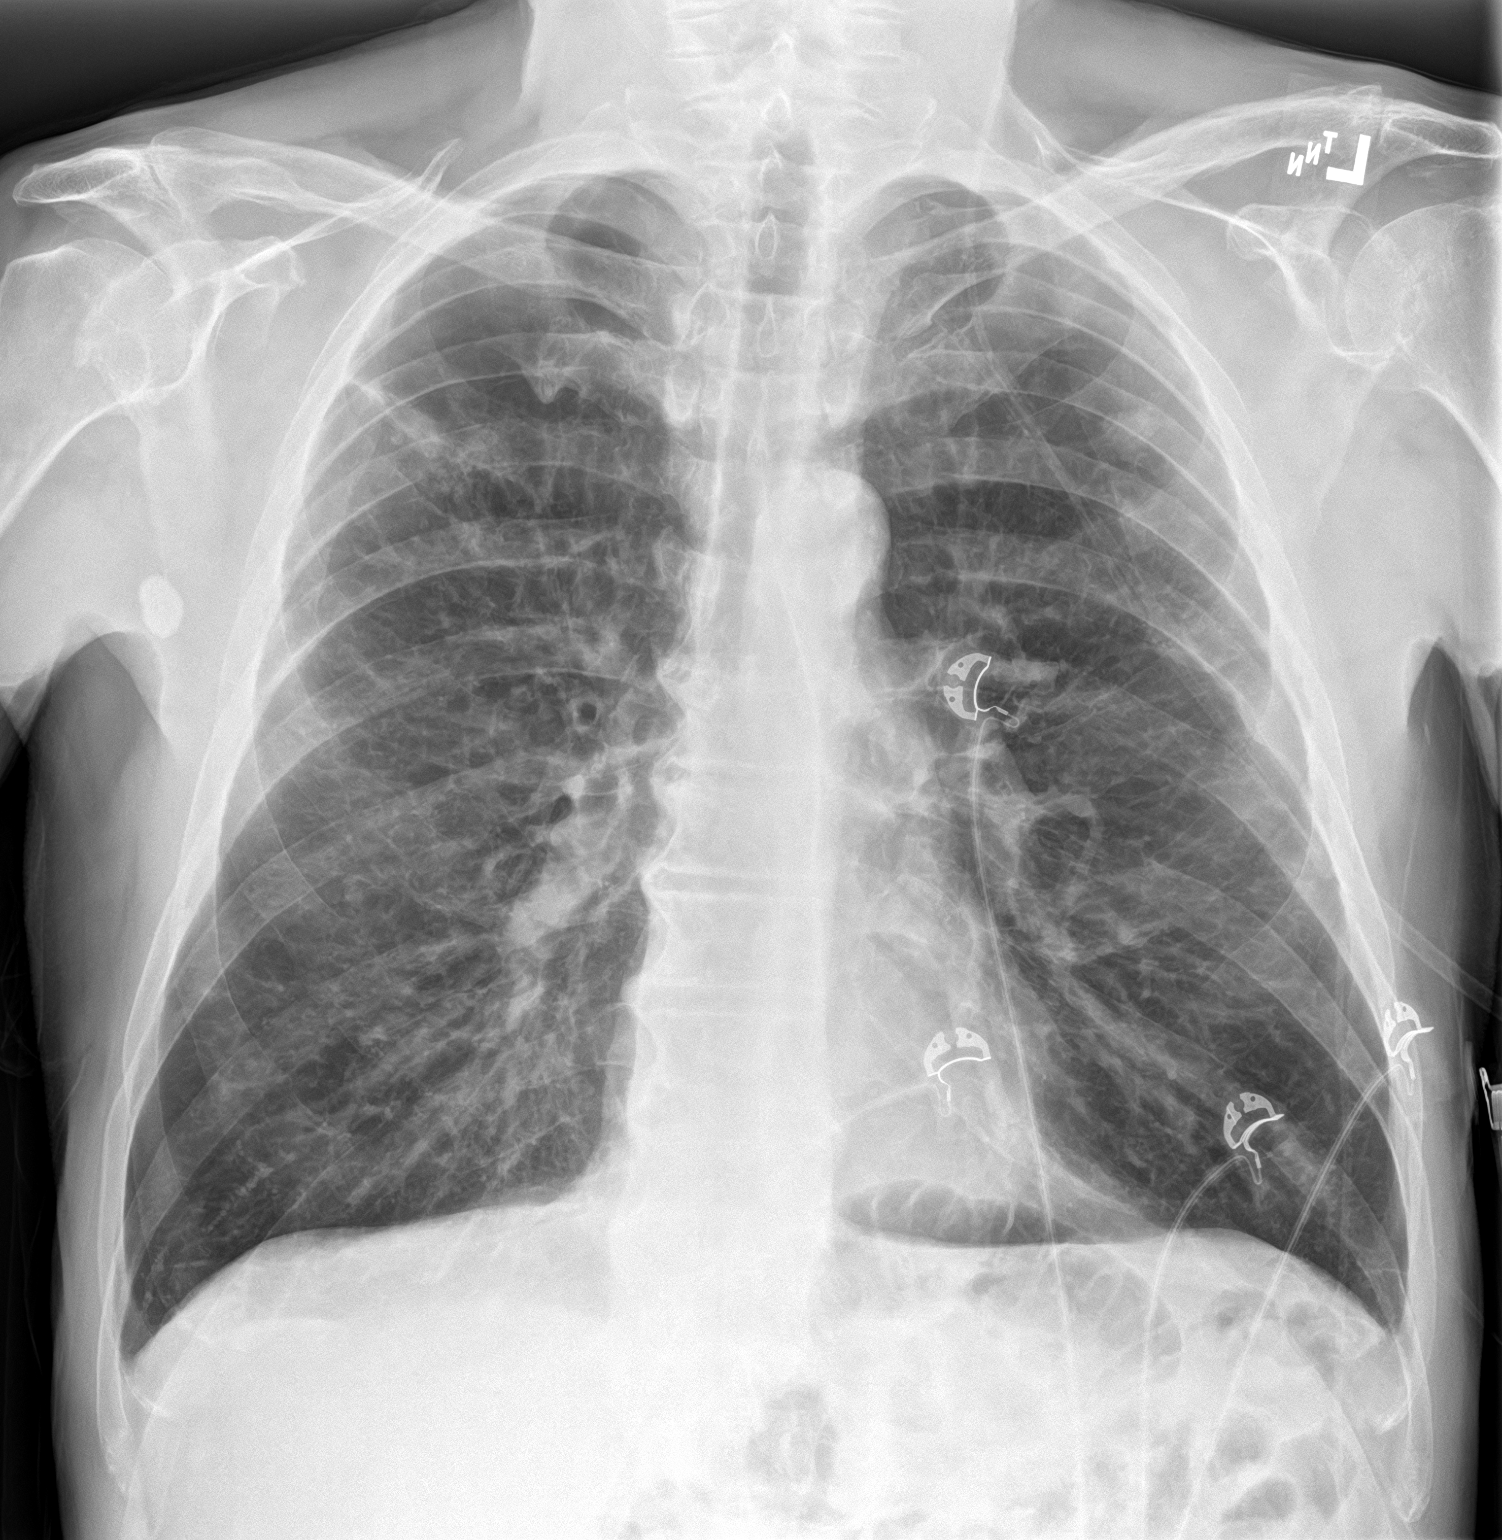
[im 2/2]
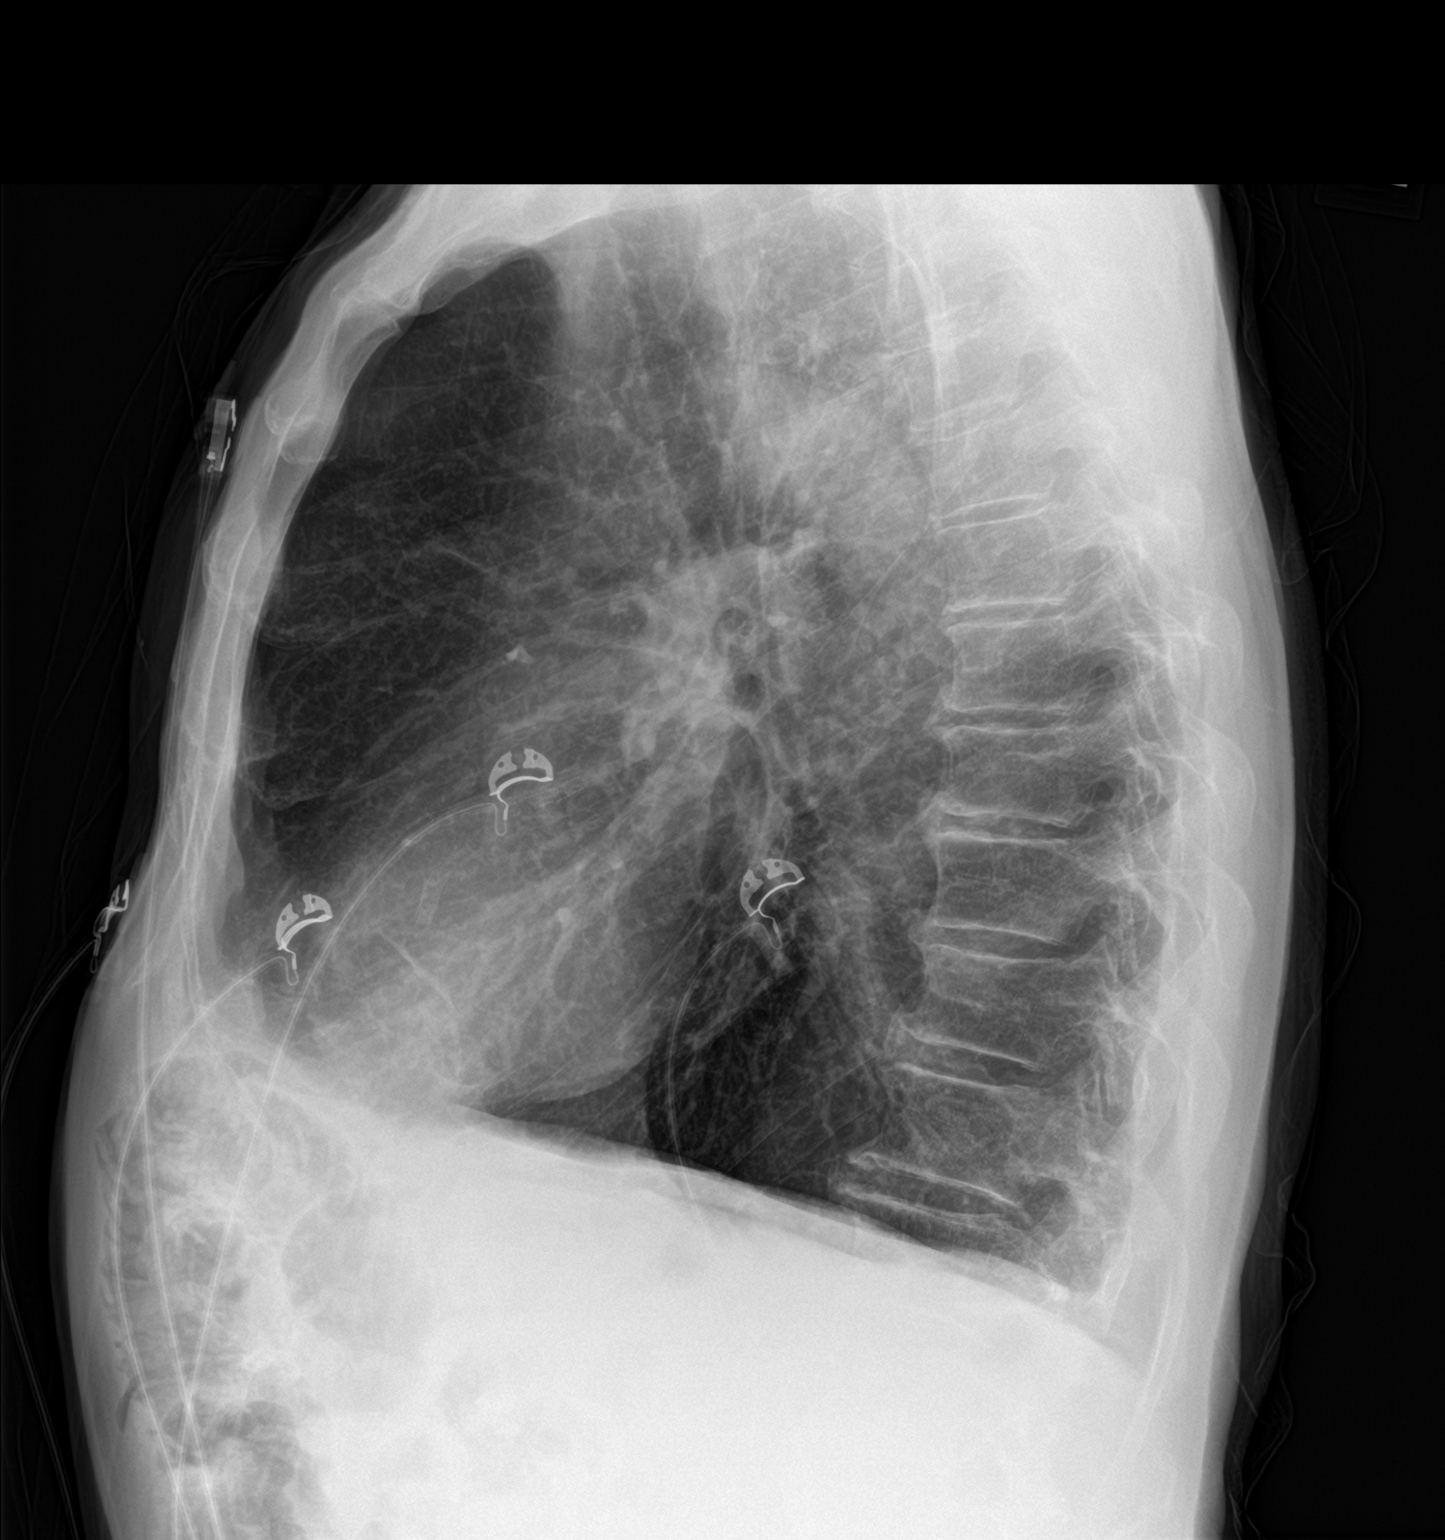

[2 of 2 positions shown; findings below may reference images not displayed]

FINDINGS: Pulmonary hyperinflation again seen, consistent COPD. Bilateral
upper lobe pulmonary nodules are unchanged since most recent study
of [DATE], but are new compared to earlier exam on [DATE].

No evidence of pulmonary consolidation or edema. No evidence of
pleural effusion.
IMPRESSION: COPD.  No evidence of acute infiltrate or edema.

Indeterminate bilateral upper lobe pulmonary nodules are stable
since most recent exam, but new compared to earlier study less than
1 year ago. Recommend continued followup by chest CT in 1-2 months
(2-3 months from date of most recent CT on [DATE]) .

## 2017-12-23 MED ORDER — LORAZEPAM 2 MG/ML IJ SOLN
1.0000 mg | Freq: Once | INTRAMUSCULAR | Status: AC
  Administered 2017-12-23: 1 mg via INTRAVENOUS
  Filled 2017-12-23: qty 1

## 2017-12-23 MED ORDER — LORAZEPAM 1 MG PO TABS: 1.0000 mg | ORAL_TABLET | Freq: Two times a day (BID) | ORAL | 0 refills | Status: DC

## 2017-12-23 MED ORDER — IPRATROPIUM-ALBUTEROL 0.5-2.5 (3) MG/3ML IN SOLN
3.0000 mL | Freq: Once | RESPIRATORY_TRACT | Status: AC
  Administered 2017-12-23: 3 mL via RESPIRATORY_TRACT
  Filled 2017-12-23: qty 3

## 2017-12-23 NOTE — ED Triage Notes (Addendum)
[facility] called to residence for c/o SOB.  Patient states symptoms started yesterday afternoon.  Patient recently completed a prednisone taper for COPD exacerbation.  Patient has been taking alberterol at home without relief.  125 mg solumedrol given, duoneb and albuterol given by EMS PTA.  20 ga RAC.  Patient reports a productive cough.  Wears home oxygen 2l/[place] at night.

## 2017-12-23 NOTE — ED Provider Notes (Signed)
[facility] Emergency Department Provider Note       Time seen: ----------------------------------------- 3:14 PM on 12/23/2017 -----------------------------------------   I have reviewed the triage vital signs and the nursing notes.  HISTORY   Chief Complaint Shortness of Breath    HPI Leslie Sims is a 67 y.o. male with a history of anxiety, COPD, CHF and chest pain who presents to the ED for shortness of breath.  Patient states symptoms started yesterday afternoon.  He recently completed a prednisone taper for COPD exacerbation.  He had been taking albuterol at home without relief.  His main issue seems to be stress he thinks.  His wife of over 30 years left him in the last 6 months.  He denies any productive cough.  He wears home oxygen only at night.  Past Medical History:  Diagnosis Date  . Allergic rhinitis   . Anxiety   . Atherosclerosis   . Back pain   . Bronchitis    09-14-11  . CARDIOMYOPATHY 03/24/2010   did not tolerate Toprol and Lisinopril.   . Chest pain   . CHF (congestive heart failure) ([place])   . Chronic airway obstruction, not elsewhere classified   . Colon polyps 08/2012   colonoscopy; multiple colon polyps; repeat colonoscopy in one year.  [name] COPD (chronic obstructive pulmonary disease) ([place])   . Coronary artery disease 11/2009   Anterior MI with late presentation 11/2009. Cath showed a 100% proximal LAD occlusion, 99% mid RCA. Had a PCI and 2 DES to both LAD and RCA. EF was 35%. Nuclear stress test 05/13: prior anterior/inferior infarcts without ischemic, EF 43%, cardiac cath in 02/14: Widely patent stents with no other obstructive disease. EF 40%   . Depression   . Diabetes mellitus without complication ([place])   . GERD (gastroesophageal reflux disease)   . Headache(784.0)   . Helicobacter pylori (H. pylori)   . Hemorrhoids   . Hernia    inguinal  . Hyperlipidemia   . Hypertension   . Insomnia   . MI (myocardial  infarction) ([place])   . Myalgia   . Peptic ulcer   . Shortness of breath dyspnea   . Status post dilation of esophageal narrowing 2000  . Thyroid dysfunction   . Tinea pedis   . Wears dentures    partial upper    Patient Active Problem List   Diagnosis Date Noted  . Unstable angina ([place])   . Benign neoplasm of ascending colon   . Benign neoplasm of descending colon   . Benign neoplasm of sigmoid colon   . Personal history of colonic polyps   . Left upper lobe pulmonary nodule 01/05/2015  . Bruit of left carotid artery 11/04/2014  . Sleep disorder 07/18/2013  . Seasonal and perennial allergic rhinitis 05/29/2013  . Bradycardia 04/20/2011  . SMOKER 07/30/2010  . CAD, NATIVE VESSEL 04/23/2010  . Hyperlipidemia 03/24/2010  . DEPRESSION/ANXIETY 03/24/2010  . Chronic systolic heart failure ([place]) 03/24/2010  . COPD, very severe ([place]) 03/24/2010  . GERD 03/24/2010  . Essential hypertension 11/21/2009    Past Surgical History:  Procedure Laterality Date  . Admission  12/20/2012   COPD exacerbation.  [place].  [name] CARDIAC CATHETERIZATION    . CARDIAC CATHETERIZATION  02/05/13   [facility]  . CARDIAC CATHETERIZATION  10/14   [facility] : patent stents with no change in anatomy. EF: 40$  . CARDIAC CATHETERIZATION Left 11/12/2016   Procedure: Left Heart Cath and Coronary Angiography;  Surgeon: Mertie Clause  Fletcher Anon, MD;  Location: [place] [facility];  Service: Cardiovascular;  Laterality: Left;  . COLONOSCOPY    . COLONOSCOPY WITH PROPOFOL N/A 05/18/2016   Procedure: COLONOSCOPY WITH PROPOFOL;  Surgeon: Lucilla Lame, MD;  Location: [facility];  Service: Endoscopy;  Laterality: N/A;  . CORONARY ANGIOPLASTY WITH STENT PLACEMENT  09/2009   LAD 3.0 X23 mm Xience DES, RCA: 4.0 X 15 mm Xience DES  . ELECTROMAGNETIC NAVIGATION BROCHOSCOPY N/A 10/27/2017   Procedure: ELECTROMAGNETIC NAVIGATION BRONCHOSCOPY;  Surgeon: Flora Lipps, MD;  Location: [facility];  Service: Cardiopulmonary;  Laterality: N/A;  .  ESOPHAGEAL DILATION    . ESOPHAGOGASTRODUODENOSCOPY  2008  . ESOPHAGOGASTRODUODENOSCOPY  08/20/2012  . HERNIA REPAIR  07/20/2012   L inguinal hernia repair  . PENILE PROSTHESIS IMPLANT      Allergies Effexor xr [venlafaxine hcl er] and Wellbutrin [bupropion]  Social History Social History   Tobacco Use  . Smoking status: Current Every Day Smoker    Packs/day: 1.50    Years: 42.00    Pack years: 63.00    Types: Cigarettes  . Smokeless tobacco: Never Used  . Tobacco comment: smoking 15-18 cigarettes daily  Substance Use Topics  . Alcohol use: No    Alcohol/week: 0.0 oz  . Drug use: No    Review of Systems Constitutional: Negative for fever. Cardiovascular: Negative for chest pain. Respiratory: Positive for shortness of breath Gastrointestinal: Negative for abdominal pain, vomiting and diarrhea. Genitourinary: Negative for dysuria. Musculoskeletal: Negative for back pain. Skin: Negative for rash. Neurological: Negative for headaches, focal weakness or numbness.  All systems negative/normal/unremarkable except as stated in the HPI  ____________________________________________   PHYSICAL EXAM:  VITAL SIGNS: ED Triage Vitals  Enc Vitals Group     BP 12/23/17 1447 116/66     Pulse Rate 12/23/17 1447 (!) 102     Resp 12/23/17 1447 20     Temp 12/23/17 1447 98.5 F (36.9 C)     Temp Source 12/23/17 1447 Oral     SpO2 12/23/17 1447 95 %     Weight 12/23/17 1445 147 lb (66.7 kg)     Height 12/23/17 1445 5\' 10"  (1.778 m)     Head Circumference --      Peak Flow --      Pain Score --      Pain Loc --      Pain Edu? --      Excl. in [place]? --     Constitutional: Alert and oriented.  Anxious, mild distress Eyes: Conjunctivae are normal. Normal extraocular movements. ENT   Head: Normocephalic and atraumatic.   Nose: No congestion/rhinnorhea.   Mouth/Throat: Mucous membranes are moist.   Neck: No stridor. Cardiovascular: Normal rate, regular rhythm. No  murmurs, rubs, or gallops. Respiratory: Prolonged expirations, scattered rhonchi. Gastrointestinal: Soft and nontender. Normal bowel sounds Musculoskeletal: Nontender with normal range of motion in extremities. No lower extremity tenderness nor edema. Neurologic:  Normal speech and language. No gross focal neurologic deficits are appreciated.  Skin:  Skin is warm, dry and intact. No rash noted. Psychiatric: Depressed mood and affect, cries easily. ____________________________________________  EKG: Interpreted by me.  Sinus tachycardia with a rate of 110 bpm, PVCs, possible anterior infarct age-indeterminate, left axis deviation  ____________________________________________  ED COURSE:  As part of my medical decision making, I reviewed the following data within the [place] History obtained from family if available, nursing notes, old chart and ekg, as well as notes from prior ED visits.  Patient presented for shortness of breath, we will assess with labs and imaging as indicated at this time.   Procedures ____________________________________________   LABS (pertinent positives/negatives)  Labs Reviewed  CBC WITH DIFFERENTIAL/PLATELET - Abnormal; Notable for the following components:      Result Value   WBC 17.1 (*)    Neutro Abs 13.5 (*)    Monocytes Absolute 1.8 (*)    All other components within normal limits  BASIC METABOLIC PANEL - Abnormal; Notable for the following components:   Chloride 97 (*)    CO2 35 (*)    Glucose, Bld 125 (*)    All other components within normal limits  TROPONIN I - Abnormal; Notable for the following components:   Troponin I 0.03 (*)    All other components within normal limits  BLOOD GAS, VENOUS - Abnormal; Notable for the following components:   pO2, Ven 52.0 (*)    Bicarbonate 37.2 (*)    Acid-Base Excess 10.5 (*)    All other components within normal limits  BRAIN NATRIURETIC PEPTIDE    RADIOLOGY Images were viewed by  me  Chest x-ray IMPRESSION: COPD. No evidence of acute infiltrate or edema.  Indeterminate bilateral upper lobe pulmonary nodules are stable since most recent exam, but new compared to earlier study less than 1 year ago. Recommend continued followup by chest CT in 1-2 months (2-3 months from date of most recent CT on 12/07/2017) . ____________________________________________  DIFFERENTIAL DIAGNOSIS   COPD, anxiety, PE, pneumothorax, unstable angina, pneumonia  FINAL ASSESSMENT AND PLAN  Dyspnea, anxiety attack  Plan: Patient had presented for dyspnea and anxiety. Patient's labs were not concerning at this time. Patient's imaging revealed known pulmonary nodules but otherwise no acute process.  He was feeling much better after Ativan here.  I think his symptoms are consistent with a panic attack.  He is stable for outpatient follow-up.   Earleen Newport, MD   Note: This note was generated in part or whole with voice recognition software. Voice recognition is usually quite accurate but there are transcription errors that can and very often do occur. I apologize for any typographical errors that were not detected and corrected.     Earleen Newport, MD 12/23/17 [phone]

## 2017-12-23 NOTE — ED Notes (Signed)
Date and time results received: 12/23/17 1551 (use smartphrase ".now" to insert current time)  Test: Troponin Critical Value: 0.03  Name of Provider Notified: Dr. Corky Downs  Orders Received? Or Actions Taken?: Notified

## 2017-12-23 NOTE — ED Notes (Signed)

## 2017-12-25 ENCOUNTER — Encounter: Payer: Self-pay | Admitting: Emergency Medicine

## 2017-12-25 ENCOUNTER — Emergency Department: Payer: BLUE CROSS/BLUE SHIELD

## 2017-12-25 ENCOUNTER — Other Ambulatory Visit: Payer: Self-pay

## 2017-12-25 ENCOUNTER — Inpatient Hospital Stay
Admission: EM | Admit: 2017-12-25 | Discharge: 2017-12-30 | DRG: 190 | Disposition: A | Discharge status: Home / Self Care | Payer: BLUE CROSS/BLUE SHIELD | Attending: Internal Medicine | Admitting: Internal Medicine

## 2017-12-25 DIAGNOSIS — Z9981 Dependence on supplemental oxygen: Secondary | ICD-10-CM

## 2017-12-25 DIAGNOSIS — I248 Other forms of acute ischemic heart disease: Secondary | ICD-10-CM | POA: Diagnosis present

## 2017-12-25 DIAGNOSIS — Z972 Presence of dental prosthetic device (complete) (partial): Secondary | ICD-10-CM

## 2017-12-25 DIAGNOSIS — Z9689 Presence of other specified functional implants: Secondary | ICD-10-CM | POA: Diagnosis present

## 2017-12-25 DIAGNOSIS — I5022 Chronic systolic (congestive) heart failure: Secondary | ICD-10-CM | POA: Diagnosis present

## 2017-12-25 DIAGNOSIS — J440 Chronic obstructive pulmonary disease with acute lower respiratory infection: Secondary | ICD-10-CM | POA: Diagnosis present

## 2017-12-25 DIAGNOSIS — E43 Unspecified severe protein-calorie malnutrition: Secondary | ICD-10-CM | POA: Diagnosis present

## 2017-12-25 DIAGNOSIS — R0902 Hypoxemia: Secondary | ICD-10-CM | POA: Diagnosis present

## 2017-12-25 DIAGNOSIS — Z801 Family history of malignant neoplasm of trachea, bronchus and lung: Secondary | ICD-10-CM

## 2017-12-25 DIAGNOSIS — I110 Hypertensive heart disease with heart failure: Secondary | ICD-10-CM | POA: Diagnosis present

## 2017-12-25 DIAGNOSIS — J209 Acute bronchitis, unspecified: Secondary | ICD-10-CM | POA: Diagnosis present

## 2017-12-25 DIAGNOSIS — I429 Cardiomyopathy, unspecified: Secondary | ICD-10-CM | POA: Diagnosis present

## 2017-12-25 DIAGNOSIS — Z7982 Long term (current) use of aspirin: Secondary | ICD-10-CM

## 2017-12-25 DIAGNOSIS — E119 Type 2 diabetes mellitus without complications: Secondary | ICD-10-CM | POA: Diagnosis present

## 2017-12-25 DIAGNOSIS — Z888 Allergy status to other drugs, medicaments and biological substances status: Secondary | ICD-10-CM

## 2017-12-25 DIAGNOSIS — Z8711 Personal history of peptic ulcer disease: Secondary | ICD-10-CM

## 2017-12-25 DIAGNOSIS — R64 Cachexia: Secondary | ICD-10-CM | POA: Diagnosis present

## 2017-12-25 DIAGNOSIS — F419 Anxiety disorder, unspecified: Secondary | ICD-10-CM | POA: Diagnosis present

## 2017-12-25 DIAGNOSIS — Z7952 Long term (current) use of systemic steroids: Secondary | ICD-10-CM

## 2017-12-25 DIAGNOSIS — Z8249 Family history of ischemic heart disease and other diseases of the circulatory system: Secondary | ICD-10-CM

## 2017-12-25 DIAGNOSIS — Z8049 Family history of malignant neoplasm of other genital organs: Secondary | ICD-10-CM

## 2017-12-25 DIAGNOSIS — F17210 Nicotine dependence, cigarettes, uncomplicated: Secondary | ICD-10-CM | POA: Diagnosis present

## 2017-12-25 DIAGNOSIS — Z6820 Body mass index (BMI) 20.0-20.9, adult: Secondary | ICD-10-CM | POA: Diagnosis not present

## 2017-12-25 DIAGNOSIS — I2510 Atherosclerotic heart disease of native coronary artery without angina pectoris: Secondary | ICD-10-CM | POA: Diagnosis present

## 2017-12-25 DIAGNOSIS — Z825 Family history of asthma and other chronic lower respiratory diseases: Secondary | ICD-10-CM

## 2017-12-25 DIAGNOSIS — J9621 Acute and chronic respiratory failure with hypoxia: Secondary | ICD-10-CM | POA: Diagnosis not present

## 2017-12-25 DIAGNOSIS — R918 Other nonspecific abnormal finding of lung field: Secondary | ICD-10-CM | POA: Diagnosis present

## 2017-12-25 DIAGNOSIS — F329 Major depressive disorder, single episode, unspecified: Secondary | ICD-10-CM | POA: Diagnosis present

## 2017-12-25 DIAGNOSIS — J449 Chronic obstructive pulmonary disease, unspecified: Secondary | ICD-10-CM | POA: Diagnosis present

## 2017-12-25 DIAGNOSIS — K219 Gastro-esophageal reflux disease without esophagitis: Secondary | ICD-10-CM | POA: Diagnosis present

## 2017-12-25 DIAGNOSIS — Z79899 Other long term (current) drug therapy: Secondary | ICD-10-CM

## 2017-12-25 DIAGNOSIS — I252 Old myocardial infarction: Secondary | ICD-10-CM

## 2017-12-25 DIAGNOSIS — Z833 Family history of diabetes mellitus: Secondary | ICD-10-CM

## 2017-12-25 DIAGNOSIS — Z955 Presence of coronary angioplasty implant and graft: Secondary | ICD-10-CM

## 2017-12-25 DIAGNOSIS — J441 Chronic obstructive pulmonary disease with (acute) exacerbation: Principal | ICD-10-CM | POA: Diagnosis present

## 2017-12-25 DIAGNOSIS — E785 Hyperlipidemia, unspecified: Secondary | ICD-10-CM | POA: Diagnosis present

## 2017-12-25 LAB — COMPREHENSIVE METABOLIC PANEL
ALBUMIN: 3.9 g/dL (ref 3.5–5.0)
ALT: 29 U/L (ref 17–63)
AST: 22 U/L (ref 15–41)
Alkaline Phosphatase: 66 U/L (ref 38–126)
Anion gap: 10 (ref 5–15)
BUN: 19 mg/dL (ref 6–20)
CHLORIDE: 93 mmol/L — AB (ref 101–111)
CO2: 36 mmol/L — ABNORMAL HIGH (ref 22–32)
CREATININE: 0.7 mg/dL (ref 0.61–1.24)
Calcium: 8.9 mg/dL (ref 8.9–10.3)
GFR calc Af Amer: >60 mL/min (ref >60)
GLUCOSE: 164 mg/dL — AB (ref 65–99)
Potassium: 4.5 mmol/L (ref 3.5–5.1)
Sodium: 139 mmol/L (ref 135–145)
Total Bilirubin: 0.7 mg/dL (ref 0.3–1.2)
Total Protein: 7.1 g/dL (ref 6.5–8.1)

## 2017-12-25 LAB — CBC WITH DIFFERENTIAL/PLATELET
Basophils Absolute: 0 10*3/uL (ref 0–0.1)
Basophils Relative: 0 %
EOS PCT: 1 %
Eosinophils Absolute: 0.1 10*3/uL (ref 0–0.7)
HEMATOCRIT: 43.1 % (ref 40.0–52.0)
Hemoglobin: 14.1 g/dL (ref 13.0–18.0)
LYMPHS ABS: 0.6 10*3/uL — AB (ref 1.0–3.6)
Lymphocytes Relative: 8 %
MCH: 30.1 pg (ref 26.0–34.0)
MCHC: 32.7 g/dL (ref 32.0–36.0)
MCV: 92.2 fL (ref 80.0–100.0)
MONO ABS: 2 10*3/uL — AB (ref 0.2–1.0)
MONOS PCT: 28 %
Neutro Abs: 4.7 10*3/uL (ref 1.4–6.5)
Neutrophils Relative %: 63 %
PLATELETS: 210 10*3/uL (ref 150–440)
RBC: 4.68 MIL/uL (ref 4.40–5.90)
RDW: 13.5 % (ref 11.5–14.5)
WBC: 7.4 10*3/uL (ref 3.8–10.6)

## 2017-12-25 LAB — BLOOD GAS, VENOUS
Acid-Base Excess: 10.5 mmol/L — ABNORMAL HIGH (ref 0.0–2.0)
Bicarbonate: 41.4 mmol/L — ABNORMAL HIGH (ref 20.0–28.0)
O2 Saturation: 81.6 %
PATIENT TEMPERATURE: 37
PO2 VEN: 52 mmHg — AB (ref 32.0–45.0)
pCO2, Ven: 88 mmHg (ref 44.0–60.0)
pH, Ven: 7.28 (ref 7.250–7.430)

## 2017-12-25 LAB — INFLUENZA PANEL BY PCR (TYPE A & B)
INFLAPCR: NEGATIVE
INFLBPCR: NEGATIVE

## 2017-12-25 LAB — LACTIC ACID, PLASMA: LACTIC ACID, VENOUS: 1 mmol/L (ref 0.5–1.9)

## 2017-12-25 LAB — TROPONIN I: TROPONIN I: 0.08 ng/mL — AB (ref <0.03)

## 2017-12-25 IMAGING — DX DG CHEST 1V PORT
1 series · 1 of 1 positions shown · non-contrast
Comparison: [DATE]

CLINICAL DATA: Patient coming from home via EMS for respiratory
1 dueneb and 125mg solumedrol. Placed on c-pap and 18 gauge IV
started.

EXAM:
PORTABLE CHEST 1 VIEW

[chest ap]
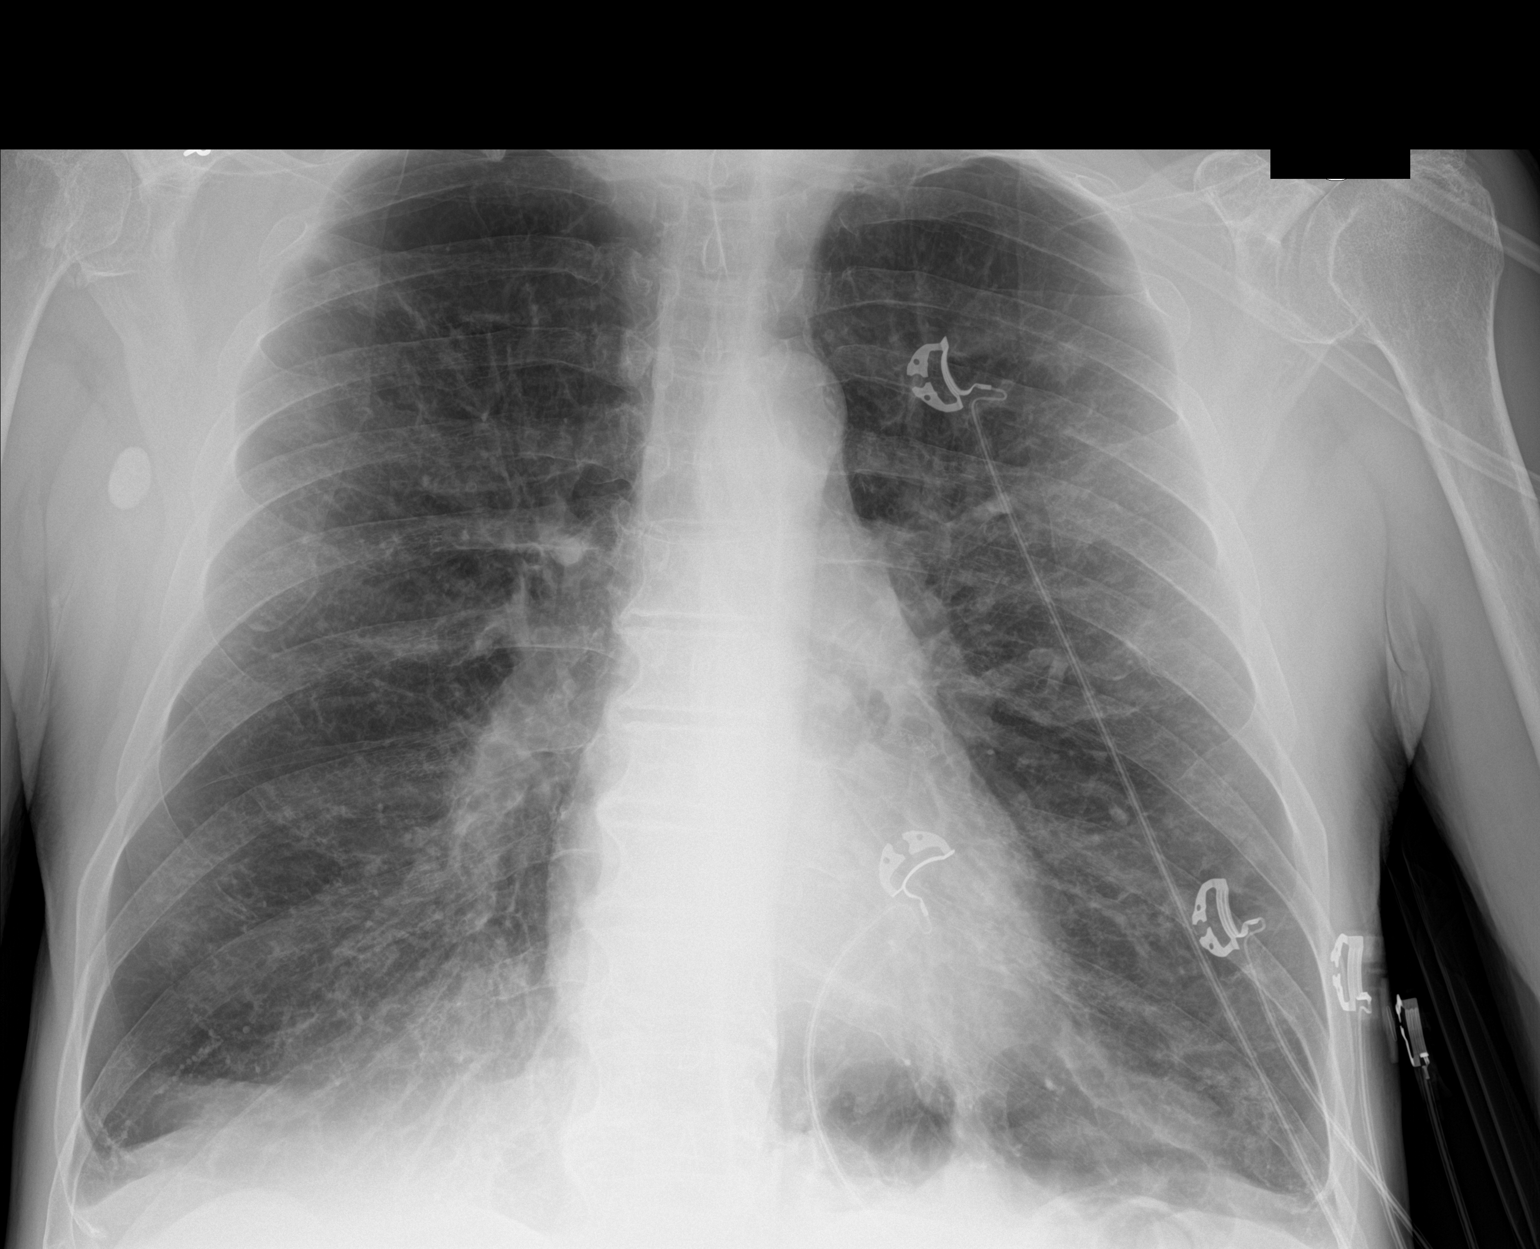

[1 of 1 positions shown; findings below may reference images not displayed]

FINDINGS: Lungs are hyperinflated. Heart size is normal. There is perihilar
peribronchial thickening. No focal consolidations or pleural
effusions. No pulmonary edema. Mass in the right upper lobe measures
1.4 x 1.7 cm. Mass in the left upper lobe measures 1.4 x 1.1 cm.
IMPRESSION: 1. Emphysematous and bronchitic changes.
2. Bilateral upper lobe masses, previously evaluated by PET and CT
exam.

## 2017-12-25 MED ORDER — ASPIRIN 81 MG PO CHEW
324.0000 mg | CHEWABLE_TABLET | Freq: Once | ORAL | Status: AC
  Administered 2017-12-25: 324 mg via ORAL
  Filled 2017-12-25: qty 4

## 2017-12-25 MED ORDER — IPRATROPIUM-ALBUTEROL 0.5-2.5 (3) MG/3ML IN SOLN
3.0000 mL | Freq: Once | RESPIRATORY_TRACT | Status: AC
  Administered 2017-12-25: 3 mL via RESPIRATORY_TRACT

## 2017-12-25 NOTE — ED Provider Notes (Signed)
[facility] Emergency Department Provider Note    None    (approximate)  I have reviewed the triage vital signs and the nursing notes.   HISTORY  Chief Complaint Respiratory Distress    HPI Leslie Sims is a 67 y.o. male with a history of COPD anxiety depression on 2 L of nasal cannula at home for chronic respiratory failure presents via EMS in acute respiratory distress.  Patient was recently admitted to the hospital for reported anxiety and discharged with Xanax.  Does admit to taking one this afternoon.  On arrival patient is on CPAP unable to provide much history due to acute respiratory distress.  Per EMS report the patient was found by the fire department at 70% oxygen saturations on 3 L nasal cannula.  EMS evaluated patient gave him 125 Solu-Medrol DuoNeb and placed him on CPAP and were able to get his oxygen saturations to improve to the low 90s.  Patient denies any fevers or cough.    Past Medical History:  Diagnosis Date  . Allergic rhinitis   . Anxiety   . Atherosclerosis   . Back pain   . Bronchitis    09-14-11  . CARDIOMYOPATHY 03/24/2010   did not tolerate Toprol and Lisinopril.   . Chest pain   . CHF (congestive heart failure) ([place])   . Chronic airway obstruction, not elsewhere classified   . Colon polyps 08/2012   colonoscopy; multiple colon polyps; repeat colonoscopy in one year.  [name] COPD (chronic obstructive pulmonary disease) ([place])   . Coronary artery disease 11/2009   Anterior MI with late presentation 11/2009. Cath showed a 100% proximal LAD occlusion, 99% mid RCA. Had a PCI and 2 DES to both LAD and RCA. EF was 35%. Nuclear stress test 05/13: prior anterior/inferior infarcts without ischemic, EF 43%, cardiac cath in 02/14: Widely patent stents with no other obstructive disease. EF 40%   . Depression   . Diabetes mellitus without complication ([place])   . GERD (gastroesophageal reflux disease)   . Headache(784.0)   .  Helicobacter pylori (H. pylori)   . Hemorrhoids   . Hernia    inguinal  . Hyperlipidemia   . Hypertension   . Insomnia   . MI (myocardial infarction) ([place])   . Myalgia   . Peptic ulcer   . Shortness of breath dyspnea   . Status post dilation of esophageal narrowing 2000  . Thyroid dysfunction   . Tinea pedis   . Wears dentures    partial upper   Family History  Problem Relation Age of Onset  . Heart attack Brother        Brother #1  . Diabetes Brother   . Hypertension Brother        #3  . Coronary artery disease Father 4       deceased  . Heart attack Father   . Diabetes Father   . Heart disease Father   . COPD Mother 49       deceased  . Alcohol abuse Sister        polysubstance abuse  . COPD Sister   . Lung cancer Sister   . Alcohol abuse Sister        polysubstance abuse  . Penile cancer Brother   . Diabetes Brother    Past Surgical History:  Procedure Laterality Date  . Admission  12/20/2012   COPD exacerbation.  [place].  [name] CARDIAC CATHETERIZATION    . CARDIAC CATHETERIZATION  02/05/13  [place]  . CARDIAC CATHETERIZATION  10/14   [facility] : patent stents with no change in anatomy. EF: 40$  . CARDIAC CATHETERIZATION Left 11/12/2016   Procedure: Left Heart Cath and Coronary Angiography;  Surgeon: Wellington Hampshire, MD;  Location: [place] [facility];  Service: Cardiovascular;  Laterality: Left;  . COLONOSCOPY    . COLONOSCOPY WITH PROPOFOL N/A 05/18/2016   Procedure: COLONOSCOPY WITH PROPOFOL;  Surgeon: Lucilla Lame, MD;  Location: [facility] ENDOSCOPY;  Service: Endoscopy;  Laterality: N/A;  . CORONARY ANGIOPLASTY WITH STENT PLACEMENT  09/2009   LAD 3.0 X23 mm Xience DES, RCA: 4.0 X 15 mm Xience DES  . ELECTROMAGNETIC NAVIGATION BROCHOSCOPY N/A 10/27/2017   Procedure: ELECTROMAGNETIC NAVIGATION BRONCHOSCOPY;  Surgeon: Flora Lipps, MD;  Location: [facility] ORS;  Service: Cardiopulmonary;  Laterality: N/A;  . ESOPHAGEAL DILATION    . ESOPHAGOGASTRODUODENOSCOPY  2008  .  ESOPHAGOGASTRODUODENOSCOPY  08/20/2012  . HERNIA REPAIR  07/20/2012   L inguinal hernia repair  . PENILE PROSTHESIS IMPLANT     Patient Active Problem List   Diagnosis Date Noted  . Unstable angina ([place])   . Benign neoplasm of ascending colon   . Benign neoplasm of descending colon   . Benign neoplasm of sigmoid colon   . Personal history of colonic polyps   . Left upper lobe pulmonary nodule 01/05/2015  . Bruit of left carotid artery 11/04/2014  . Sleep disorder 07/18/2013  . Seasonal and perennial allergic rhinitis 05/29/2013  . Bradycardia 04/20/2011  . SMOKER 07/30/2010  . CAD, NATIVE VESSEL 04/23/2010  . Hyperlipidemia 03/24/2010  . DEPRESSION/ANXIETY 03/24/2010  . Chronic systolic heart failure ([place]) 03/24/2010  . COPD, very severe ([place]) 03/24/2010  . GERD 03/24/2010  . Essential hypertension 11/21/2009      Prior to Admission medications   Medication Sig Start Date End Date Taking? Authorizing Provider  albuterol (PROVENTIL) (2.5 MG/3ML) 0.083% nebulizer solution USE 1 VIAL (3 ML) BY NEBULIZATION EVERY 6 HOURS AS NEEDED FOR WHEEZING OR SHORTNESS OF BREATH 05/23/17   [name], MD  albuterol (VENTOLIN HFA) 108 (90 Base) MCG/ACT inhaler USE 2 INHALATIONS EVERY 6 HOURS AS NEEDED FOR WHEEZING 08/19/17   [name], MD  aspirin 81 MG tablet Take 81 mg by mouth daily.      [provider]  atorvastatin (LIPITOR) 40 MG tablet Take 1 tablet (40 mg total) by mouth daily. 01/15/17   [name], MD  azelastine (ASTELIN) 0.1 % nasal spray Place 2 sprays into both nostrils 2 (two) times daily. Use in each nostril as directed 03/01/17   [name], MD  fluticasone [facility]) 50 MCG/ACT nasal spray Place 1 spray into both nostrils 2 (two) times daily. 03/01/17   [name], MD  Fluticasone-Salmeterol (ADVAIR DISKUS) 500-50 MCG/DOSE AEPB Inhale 1 puff into the lungs 2 (two) times daily. 07/27/17   [name], MD  furosemide (LASIX) 20 MG tablet Take 1 tablet  (20 mg total) by mouth daily. 07/27/17 12/16/17  [name], MD  ipratropium-albuterol (DUONEB) 0.5-2.5 (3) MG/3ML SOLN INHALE 1 VIAL BY NEBULIZER EVERY 6 HOURS AS NEEDED 09/12/17   [name], MD  ketoconazole (NIZORAL) 2 % cream Apply 1 application topically 2 (two) times daily. 08/19/17   [name], MD  levalbuterol Penne Lash) 1.25 MG/3ML nebulizer solution Inhale into the lungs.    [provider]  LORazepam (ATIVAN) 1 MG tablet Take 1 tablet (1 mg total) by mouth 2 (two) times daily. 12/23/17  12/23/18  Earleen Newport, MD  losartan (COZAAR) 25 MG tablet Take 1 tablet (25 mg total) by mouth daily. 02/17/17   Minna Merritts, MD  methylPREDNISolone (MEDROL DOSEPAK) 4 MG TBPK tablet Take as directed. 12/07/17   Lloyd Huger, MD  mirtazapine (REMERON) 15 MG tablet Take 1 tablet (15 mg total) by mouth at bedtime. 10/05/17   [name], MD  Multiple Vitamin (MULTIVITAMIN) capsule Take 1 capsule by mouth daily.    [provider]  nitroGLYCERIN (NITROSTAT) 0.4 MG SL tablet Place 1 tablet (0.4 mg total) under the tongue every 5 (five) minutes as needed. 10/17/17   Wellington Hampshire, MD  predniSONE (DELTASONE) 20 MG tablet 3 tabs po once, then 2 tabs po qd for 2 days, then 1 tab po qd for 2 days, then half a tab po qd x 2 days 12/16/17   Norval Gable, MD  sertraline (ZOLOFT) 100 MG tablet Take 1 tablet (100 mg total) by mouth at bedtime. 11/30/17   [name], MD  sodium chloride (OCEAN) 0.65 % SOLN nasal spray Place 2 sprays into both nostrils every 2 (two) hours while awake. 11/18/16 10/27/17  [name], MD  sucralfate (CARAFATE) 1 GM/10ML suspension Take 10 mLs (1 g total) by mouth 4 (four) times daily -  with meals and at bedtime. 07/15/17   [name], MD  tiotropium (SPIRIVA HANDIHALER) 18 MCG inhalation capsule Place 1 capsule (18 mcg total) into inhaler and inhale daily. 07/27/17   [name], MD    Allergies Effexor xr Henrene Hawking  hcl er] and Wellbutrin [bupropion]    Social History Social History   Tobacco Use  . Smoking status: Current Every Day Smoker    Packs/day: 1.50    Years: 42.00    Pack years: 63.00    Types: Cigarettes  . Smokeless tobacco: Never Used  . Tobacco comment: smoking 15-18 cigarettes daily  Substance Use Topics  . Alcohol use: No    Alcohol/week: 0.0 oz  . Drug use: No    Review of Systems Patient denies headaches, rhinorrhea, blurry vision, numbness, shortness of breath, chest pain, edema, cough, abdominal pain, nausea, vomiting, diarrhea, dysuria, fevers, rashes or hallucinations unless otherwise stated above in HPI. ____________________________________________   PHYSICAL EXAM:  VITAL SIGNS: Vitals:   12/25/17 2043 12/25/17 2100  BP: (!) 178/160 (!) 157/96  Pulse: (!) 104 (!) 114  Resp:  20  Temp: (!) 97.4 F (36.3 C)   SpO2: 98% 98%    Constitutional: Drowsy but protecting his airway in moderate to severe respiratory distress Eyes: Conjunctivae are normal.  Head: Atraumatic. Nose: No congestion/rhinnorhea. Mouth/Throat: Mucous membranes are moist.   Neck: No stridor. Painless ROM.  Cardiovascular: Cardiac, regular rhythm. Grossly normal heart sounds.  Good peripheral circulation. Respiratory: Acute respiratory distress with prolonged expiratory phase with expiratory crackles in the bases but faint wheezes with diminished breath sounds bilaterally Gastrointestinal: Soft and nontender. No distention. No abdominal bruits. No CVA tenderness. Genitourinary:  Musculoskeletal: No lower extremity tenderness nor edema.  No joint effusions. Neurologic:  Normal speech and language. No gross focal neurologic deficits are appreciated. No facial droop Skin:  Skin is warm, dry and intact. No rash noted. Psychiatric: unable to assess     ____________________________________________   LABS (all labs ordered are listed, but only abnormal results are displayed)  Results for  orders placed or performed during the hospital encounter of 12/25/17 (from the past 24 hour(s))  Lactic acid,  plasma     Status: None   Collection Time: 12/25/17  8:44 PM  Result Value Ref Range   Lactic Acid, Venous 1.0 0.5 - 1.9 mmol/L  Blood gas, venous ([facility], [facility], [facility])     Status: Abnormal   Collection Time: 12/25/17  8:45 PM  Result Value Ref Range   pH, Ven 7.28 7.250 - 7.430   pCO2, Ven 88 (HH) 44.0 - 60.0 mmHg   pO2, Ven 52.0 (H) 32.0 - 45.0 mmHg   Bicarbonate 41.4 (H) 20.0 - 28.0 mmol/L   Acid-Base Excess 10.5 (H) 0.0 - 2.0 mmol/L   O2 Saturation 81.6 %   Patient temperature 37.0    Collection site LINE    Sample type VENOUS   Comprehensive metabolic panel     Status: Abnormal   Collection Time: 12/25/17  8:50 PM  Result Value Ref Range   Sodium 139 135 - 145 mmol/L   Potassium 4.5 3.5 - 5.1 mmol/L   Chloride 93 (L) 101 - 111 mmol/L   CO2 36 (H) 22 - 32 mmol/L   Glucose, Bld 164 (H) 65 - 99 mg/dL   BUN 19 6 - 20 mg/dL   Creatinine, Ser 0.70 0.61 - 1.24 mg/dL   Calcium 8.9 8.9 - 10.3 mg/dL   Total Protein 7.1 6.5 - 8.1 g/dL   Albumin 3.9 3.5 - 5.0 g/dL   AST 22 15 - 41 U/L   ALT 29 17 - 63 U/L   Alkaline Phosphatase 66 38 - 126 U/L   Total Bilirubin 0.7 0.3 - 1.2 mg/dL   GFR calc non Af Amer >60 >60 mL/min   GFR calc Af Amer >60 >60 mL/min   Anion gap 10 5 - 15  Troponin I     Status: Abnormal   Collection Time: 12/25/17  8:50 PM  Result Value Ref Range   Troponin I 0.08 (HH) <0.03 ng/mL  CBC WITH DIFFERENTIAL     Status: Abnormal   Collection Time: 12/25/17  8:50 PM  Result Value Ref Range   WBC 7.4 3.8 - 10.6 K/uL   RBC 4.68 4.40 - 5.90 MIL/uL   Hemoglobin 14.1 13.0 - 18.0 g/dL   HCT 43.1 40.0 - 52.0 %   MCV 92.2 80.0 - 100.0 fL   MCH 30.1 26.0 - 34.0 pg   MCHC 32.7 32.0 - 36.0 g/dL   RDW 13.5 11.5 - 14.5 %   Platelets 210 150 - 440 K/uL   Neutrophils Relative % 63 %   Neutro Abs 4.7 1.4 - 6.5 K/uL   Lymphocytes Relative 8 %   Lymphs Abs 0.6 (L) 1.0 -  3.6 K/uL   Monocytes Relative 28 %   Monocytes Absolute 2.0 (H) 0.2 - 1.0 K/uL   Eosinophils Relative 1 %   Eosinophils Absolute 0.1 0 - 0.7 K/uL   Basophils Relative 0 %   Basophils Absolute 0.0 0 - 0.1 K/uL  Influenza panel by PCR (type A & B)     Status: None   Collection Time: 12/25/17  9:19 PM  Result Value Ref Range   Influenza A By PCR NEGATIVE NEGATIVE   Influenza B By PCR NEGATIVE NEGATIVE   ____________________________________________  EKG My review and personal interpretation at Time: 20:42   Indication: resp distress  Rate: 120 Rhythm: sinus Axis: left Other: no stemi, nonspecific st changes likely rate dependent, normal intervals.  Limited interpretation 2/2 baseline artifact ____________________________________________  RADIOLOGY  I personally reviewed all radiographic images ordered to evaluate  for the above acute complaints and reviewed radiology reports and findings.  These findings were personally discussed with the patient.  Please see medical record for radiology report.     EMERGENCY DEPARTMENT [place] CARDIAC EXAM "Study: Limited Ultrasound of the Heart and Pericardium"  INDICATIONS:Abnormal vital signs and Dyspnea Multiple views of the heart and pericardium were obtained in real-time with a multi-frequency probe.  PERFORMED HY:QMVHQI IMAGES ARCHIVED?: Yes LIMITATIONS:  Emergent procedure VIEWS USED: Subcostal 4 chamber INTERPRETATION: Cardiac activity present, Pericardial effusioin absent and Volume status normal  EMERGENCY DEPARTMENT [place] LUNG EXAM "Study: Limited Ultrasound of the Lung and Thorax"  INDICATIONS: Dyspnea and Abnormal vitals Multiple views of both lungs using sagittal orientation were obtained.  PERFORMED BY: Myself IMAGES ARCHIVED?: Yes LIMITATIONS: Respiratory distress VIEWS USED: Anterior lung fields and Posterior lung fields INTERPRETATION: No pneumothorax, No pulmonary edema, No pleural  effusion        ____________________________________________   PROCEDURES  Procedure(s) performed:  .Critical Care Performed by: Merlyn Lot, MD Authorized by: Merlyn Lot, MD   Critical care provider statement:    Critical care time (minutes):  40   Critical care time was exclusive of:  Separately billable procedures and treating other patients   Critical care was necessary to treat or prevent imminent or life-threatening deterioration of the following conditions:  Respiratory failure   Critical care was time spent personally by me on the following activities:  Development of treatment plan with patient or surrogate, discussions with consultants, evaluation of patient's response to treatment, examination of patient, obtaining history from patient or surrogate, ordering and performing treatments and interventions, ordering and review of laboratory studies, ordering and review of radiographic studies, pulse oximetry, re-evaluation of patient's condition and review of old charts       Critical Care performed: yes ____________________________________________   INITIAL IMPRESSION / [place] / ED COURSE  Pertinent labs & imaging results that were available during my care of the patient were reviewed by me and considered in my medical decision making (see chart for details).  DDX: Asthma, copd, CHF, pna, ptx, malignancy, Pe, anemia   Leslie Sims is a 67 y.o. who presents to the ED with respiratory distress on CPAP.  Patient with history of COPD.  Patient transitioned over to BiPAP due to acute respiratory failure.  Lung sounds and bedside ultrasound show presentation most likely secondary to severe COPD exacerbation.  Patient is protecting his airway.  Blood work sent for the above differential.  Seems less consistent with PE or ACS.  EKG shows some abnormalities but likely secondary to strain pattern in the setting of respiratory distress.  Denies any  chest pain.  The patient will be placed on continuous pulse oximetry and telemetry for monitoring.  Laboratory evaluation will be sent to evaluate for the above complaints.     Clinical Course as of Dec 25 2217  Nancy Fetter Dec 25, 2017  2058 Patient reassessed air movement does seem to be improving after continuous nebulizer and BiPAP.  States that he is feeling improved.  [PR]  2218 Patient reassessed.  Patient also has elevation of troponin but denies any chest pain.  Most likely demand ischemia in setting of respiratory distress.  Will give aspirin.  States that he is feeling much improved after BiPAP and nebulizers.  At this point do believe patient is stable for admission the hospital for continued medical management.  [PR]    Clinical Course User Index [PR] Merlyn Lot, MD  ____________________________________________   FINAL CLINICAL IMPRESSION(S) / ED DIAGNOSES  Final diagnoses:  Acute on chronic respiratory failure with hypoxia (HCC)      NEW MEDICATIONS STARTED DURING THIS VISIT:  This SmartLink is deprecated. Use AVSMEDLIST instead to display the medication list for a patient.   Note:  This document was prepared using Dragon voice recognition software and may include unintentional dictation errors.    Merlyn Lot, MD 12/25/17 2219

## 2017-12-25 NOTE — ED Notes (Signed)
Date and time results received: 12/25/17 2155  Test: Troponin  Critical Value: 0.08  Name of Provider Notified: Dr. Quentin Cornwall

## 2017-12-25 NOTE — ED Triage Notes (Signed)
Patient coming from home via EMS for respiratory distress for sats in high 70's -low 80's on 2 L [place]. Patient received 1 dueneb and 125mg  solumedrol. Placed on c-pap and 18 gauge IV started. Patient diminished with rales and rhonchi. ED MD at bedside and RT placed on bi-pap.

## 2017-12-26 DIAGNOSIS — J441 Chronic obstructive pulmonary disease with (acute) exacerbation: Principal | ICD-10-CM

## 2017-12-26 LAB — BASIC METABOLIC PANEL
Anion gap: 8 (ref 5–15)
BUN: 20 mg/dL (ref 6–20)
CHLORIDE: 95 mmol/L — AB (ref 101–111)
CO2: 35 mmol/L — ABNORMAL HIGH (ref 22–32)
Calcium: 9 mg/dL (ref 8.9–10.3)
Creatinine, Ser: 0.68 mg/dL (ref 0.61–1.24)
GFR calc Af Amer: >60 mL/min (ref >60)
Glucose, Bld: 205 mg/dL — ABNORMAL HIGH (ref 65–99)
POTASSIUM: 4.2 mmol/L (ref 3.5–5.1)
Sodium: 138 mmol/L (ref 135–145)

## 2017-12-26 LAB — CBC
HEMATOCRIT: 39.3 % — AB (ref 40.0–52.0)
Hemoglobin: 13 g/dL (ref 13.0–18.0)
MCH: 30.3 pg (ref 26.0–34.0)
MCHC: 33.1 g/dL (ref 32.0–36.0)
MCV: 91.8 fL (ref 80.0–100.0)
Platelets: 176 10*3/uL (ref 150–440)
RBC: 4.28 MIL/uL — ABNORMAL LOW (ref 4.40–5.90)
RDW: 13.2 % (ref 11.5–14.5)
WBC: 4.5 10*3/uL (ref 3.8–10.6)

## 2017-12-26 LAB — GLUCOSE, CAPILLARY: Glucose-Capillary: 146 mg/dL — ABNORMAL HIGH (ref 65–99)

## 2017-12-26 LAB — MRSA PCR SCREENING: MRSA by PCR: NEGATIVE

## 2017-12-26 MED ORDER — ACETAMINOPHEN 650 MG RE SUPP: 650.0000 mg | Freq: Four times a day (QID) | RECTAL | Status: DC | PRN

## 2017-12-26 MED ORDER — MULTIVITAMINS PO CAPS: 1.0000 | ORAL_CAPSULE | Freq: Every day | ORAL | Status: DC

## 2017-12-26 MED ORDER — ONDANSETRON HCL 4 MG/2ML IJ SOLN: 4.0000 mg | Freq: Four times a day (QID) | INTRAMUSCULAR | Status: DC | PRN

## 2017-12-26 MED ORDER — HYDROCODONE-ACETAMINOPHEN 5-325 MG PO TABS
1.0000 | ORAL_TABLET | ORAL | Status: DC | PRN
  Administered 2017-12-26: 2 via ORAL
  Administered 2017-12-27: 1 via ORAL
  Filled 2017-12-26: qty 1
  Filled 2017-12-26: qty 2

## 2017-12-26 MED ORDER — CEFTRIAXONE SODIUM 1 G IJ SOLR
1.0000 g | INTRAMUSCULAR | Status: DC
  Administered 2017-12-27 – 2017-12-28 (×2): 1 g via INTRAVENOUS
  Filled 2017-12-26 (×3): qty 10

## 2017-12-26 MED ORDER — ASPIRIN 81 MG PO CHEW
81.0000 mg | CHEWABLE_TABLET | Freq: Every day | ORAL | Status: DC
  Administered 2017-12-26 – 2017-12-30 (×5): 81 mg via ORAL
  Filled 2017-12-26 (×5): qty 1

## 2017-12-26 MED ORDER — HEPARIN SODIUM (PORCINE) 5000 UNIT/ML IJ SOLN
5000.0000 [IU] | Freq: Three times a day (TID) | INTRAMUSCULAR | Status: DC
  Administered 2017-12-26 – 2017-12-30 (×9): 5000 [IU] via SUBCUTANEOUS
  Filled 2017-12-26 (×9): qty 1

## 2017-12-26 MED ORDER — LOSARTAN POTASSIUM 25 MG PO TABS
25.0000 mg | ORAL_TABLET | Freq: Every day | ORAL | Status: DC
  Administered 2017-12-26 – 2017-12-30 (×5): 25 mg via ORAL
  Filled 2017-12-26 (×5): qty 1

## 2017-12-26 MED ORDER — MIRTAZAPINE 15 MG PO TABS
15.0000 mg | ORAL_TABLET | Freq: Every day | ORAL | Status: DC
  Administered 2017-12-26 – 2017-12-29 (×4): 15 mg via ORAL
  Filled 2017-12-26 (×5): qty 1

## 2017-12-26 MED ORDER — ACETAMINOPHEN 325 MG PO TABS: 650.0000 mg | ORAL_TABLET | Freq: Four times a day (QID) | ORAL | Status: DC | PRN

## 2017-12-26 MED ORDER — ATORVASTATIN CALCIUM 20 MG PO TABS
40.0000 mg | ORAL_TABLET | Freq: Every day | ORAL | Status: DC
  Administered 2017-12-26 – 2017-12-30 (×5): 40 mg via ORAL
  Filled 2017-12-26 (×5): qty 2

## 2017-12-26 MED ORDER — DEXTROSE 5 % IV SOLN
1.0000 g | INTRAVENOUS | Status: DC
  Administered 2017-12-26: 1 g via INTRAVENOUS
  Filled 2017-12-26: qty 10

## 2017-12-26 MED ORDER — SUCRALFATE 1 GM/10ML PO SUSP
1.0000 g | Freq: Three times a day (TID) | ORAL | Status: DC
  Administered 2017-12-26 – 2017-12-30 (×18): 1 g via ORAL
  Filled 2017-12-26 (×18): qty 10

## 2017-12-26 MED ORDER — SALINE SPRAY 0.65 % NA SOLN
2.0000 | NASAL | Status: DC
Start: 2017-12-26 — End: 2017-12-28
  Filled 2017-12-26: qty 44

## 2017-12-26 MED ORDER — LORAZEPAM 0.5 MG PO TABS
0.5000 mg | ORAL_TABLET | Freq: Four times a day (QID) | ORAL | Status: DC | PRN
  Administered 2017-12-26 – 2017-12-30 (×3): 0.5 mg via ORAL
  Filled 2017-12-26 (×3): qty 1

## 2017-12-26 MED ORDER — DOCUSATE SODIUM 100 MG PO CAPS
100.0000 mg | ORAL_CAPSULE | Freq: Two times a day (BID) | ORAL | Status: DC
  Administered 2017-12-26 – 2017-12-30 (×6): 100 mg via ORAL
  Filled 2017-12-26 (×9): qty 1

## 2017-12-26 MED ORDER — IPRATROPIUM-ALBUTEROL 0.5-2.5 (3) MG/3ML IN SOLN
3.0000 mL | RESPIRATORY_TRACT | Status: DC
  Administered 2017-12-26 – 2017-12-27 (×3): 3 mL via RESPIRATORY_TRACT
  Filled 2017-12-26 (×3): qty 3

## 2017-12-26 MED ORDER — DEXTROSE 5 % IV SOLN
500.0000 mg | INTRAVENOUS | Status: DC
  Administered 2017-12-27: 500 mg via INTRAVENOUS
  Filled 2017-12-26 (×2): qty 500

## 2017-12-26 MED ORDER — ADULT MULTIVITAMIN W/MINERALS CH
1.0000 | ORAL_TABLET | Freq: Every day | ORAL | Status: DC
  Administered 2017-12-26 – 2017-12-30 (×5): 1 via ORAL
  Filled 2017-12-26 (×5): qty 1

## 2017-12-26 MED ORDER — FUROSEMIDE 20 MG PO TABS
20.0000 mg | ORAL_TABLET | Freq: Every day | ORAL | Status: DC
  Administered 2017-12-26 – 2017-12-30 (×5): 20 mg via ORAL
  Filled 2017-12-26 (×5): qty 1

## 2017-12-26 MED ORDER — IPRATROPIUM-ALBUTEROL 0.5-2.5 (3) MG/3ML IN SOLN
3.0000 mL | Freq: Four times a day (QID) | RESPIRATORY_TRACT | Status: DC
  Administered 2017-12-26 (×4): 3 mL via RESPIRATORY_TRACT
  Filled 2017-12-26 (×4): qty 3

## 2017-12-26 MED ORDER — TIOTROPIUM BROMIDE MONOHYDRATE 18 MCG IN CAPS
18.0000 ug | ORAL_CAPSULE | Freq: Every day | RESPIRATORY_TRACT | Status: DC
Start: 2017-12-26 — End: 2017-12-30
  Administered 2017-12-26 – 2017-12-30 (×5): 18 ug via RESPIRATORY_TRACT
  Filled 2017-12-26: qty 5

## 2017-12-26 MED ORDER — ONDANSETRON HCL 4 MG PO TABS: 4.0000 mg | ORAL_TABLET | Freq: Four times a day (QID) | ORAL | Status: DC | PRN

## 2017-12-26 MED ORDER — QUETIAPINE FUMARATE 25 MG PO TABS: 50.0000 mg | ORAL_TABLET | Freq: Two times a day (BID) | ORAL | Status: DC

## 2017-12-26 MED ORDER — DEXTROSE 5 % IV SOLN
500.0000 mg | INTRAVENOUS | Status: DC
  Administered 2017-12-26: 500 mg via INTRAVENOUS
  Filled 2017-12-26: qty 500

## 2017-12-26 MED ORDER — BISACODYL 5 MG PO TBEC: 5.0000 mg | DELAYED_RELEASE_TABLET | Freq: Every day | ORAL | Status: DC | PRN

## 2017-12-26 MED ORDER — METHYLPREDNISOLONE SODIUM SUCC 125 MG IJ SOLR
80.0000 mg | Freq: Four times a day (QID) | INTRAMUSCULAR | Status: DC
  Administered 2017-12-26 – 2017-12-29 (×13): 80 mg via INTRAVENOUS
  Filled 2017-12-26 (×14): qty 2

## 2017-12-26 MED ORDER — NITROGLYCERIN 0.4 MG SL SUBL: 0.4000 mg | SUBLINGUAL_TABLET | SUBLINGUAL | Status: DC | PRN

## 2017-12-26 MED ORDER — SERTRALINE HCL 50 MG PO TABS
100.0000 mg | ORAL_TABLET | Freq: Every day | ORAL | Status: DC
  Administered 2017-12-26 – 2017-12-29 (×4): 100 mg via ORAL
  Filled 2017-12-26 (×5): qty 2

## 2017-12-26 NOTE — Progress Notes (Signed)
[place] [facility] [place] NAME: Shalin Vonbargen    MR#:  [id number]  DATE OF BIRTH:  Apr 02, 1951  SUBJECTIVE:  CHIEF COMPLAINT:   Chief Complaint  Patient presents with  . Respiratory Distress  sitting in bed, very dyspneic. Looks sick. Daughter at bedside REVIEW OF SYSTEMS:  Review of Systems  Constitutional: Positive for malaise/fatigue. Negative for chills, fever and weight loss.  HENT: Negative for nosebleeds and sore throat.   Eyes: Negative for blurred vision.  Respiratory: Positive for cough and shortness of breath. Negative for wheezing.   Cardiovascular: Negative for chest pain, orthopnea, leg swelling and PND.  Gastrointestinal: Negative for abdominal pain, constipation, diarrhea, heartburn, nausea and vomiting.  Genitourinary: Negative for dysuria and urgency.  Musculoskeletal: Negative for back pain.  Skin: Negative for rash.  Neurological: Positive for weakness. Negative for dizziness, speech change, focal weakness and headaches.  Endo/Heme/Allergies: Does not bruise/bleed easily.  Psychiatric/Behavioral: Negative for depression.    DRUG ALLERGIES:   Allergies  Allergen Reactions  . Effexor Xr [Venlafaxine Hcl Er]   . Wellbutrin [Bupropion] Other (See Comments)    Unknown/"made me feel real funny"   VITALS:  Blood pressure 131/78, pulse 84, temperature 98.2 F (36.8 C), temperature source Oral, resp. rate 14, height 5\' 10"  (1.778 m), weight 63.4 kg (139 lb 12.4 oz), SpO2 98 %. PHYSICAL EXAMINATION:  Physical Exam  Constitutional: He is oriented to person, place, and time. He appears malnourished. He appears unhealthy. He appears cachectic. He appears toxic. He has a sickly appearance.  HENT:  Head: Normocephalic and atraumatic.  Eyes: Conjunctivae and EOM are normal. Pupils are equal, round, and reactive to light.  Neck: Normal range of motion. Neck supple. No tracheal deviation present. No thyromegaly present.    Cardiovascular: Normal rate, regular rhythm and normal heart sounds.  Pulmonary/Chest: Accessory muscle usage present. No tachypnea. He is in respiratory distress. He has decreased breath sounds. He has wheezes. He exhibits no tenderness.  Abdominal: Soft. Bowel sounds are normal. He exhibits no distension. There is no tenderness.  Musculoskeletal: Normal range of motion.  Neurological: He is alert and oriented to person, place, and time. No cranial nerve deficit.  Skin: Skin is warm and dry. No rash noted.  Psychiatric: Mood and affect normal.   LABORATORY PANEL:  Male CBC Recent Labs  Lab 12/26/17 0507  WBC 4.5  HGB 13.0  HCT 39.3*  PLT 176   ------------------------------------------------------------------------------------------------------------------ Chemistries  Recent Labs  Lab 12/25/17 2050 12/26/17 0507  NA 139 138  K 4.5 4.2  CL 93* 95*  CO2 36* 35*  GLUCOSE 164* 205*  BUN 19 20  CREATININE 0.70 0.68  CALCIUM 8.9 9.0  AST 22  --   ALT 29  --   ALKPHOS 66  --   BILITOT 0.7  --    RADIOLOGY:  Dg Chest Port 1 View  Result Date: 12/25/2017 CLINICAL DATA:  Patient coming from home via EMS for respiratory distress for sats in high 70's -low 80's on 2 L [place]. Patient received 1 dueneb and 125mg  solumedrol. Placed on c-pap and 18 gauge IV started. EXAM: PORTABLE CHEST 1 VIEW COMPARISON:  12/23/2017 FINDINGS: Lungs are hyperinflated. Heart size is normal. There is perihilar peribronchial thickening. No focal consolidations or pleural effusions. No pulmonary edema. Mass in the right upper lobe measures 1.4 x 1.7 cm. Mass in the left upper lobe measures 1.4 x 1.1 cm. IMPRESSION: 1. Emphysematous and bronchitic changes. 2.  Bilateral upper lobe masses, previously evaluated by PET and CT exam. Electronically Signed   By: Nolon Nations M.D.   On: 12/25/2017 22:00   ASSESSMENT AND PLAN:   1.  Acute hypoxemic respiratory failure: continue oxygen treatment, weaned off BiPAP,  continue antibiotics, steroids and nebulizer treatments.  Monitor in stepdown unit per PCCM 2.  Acute COPD exacerbation, treatment as above. 3.  Acute bronchitis, see treatment above under #1. 4.  Benign bilateral lung nodules, status post recent biopsy by Dr Mortimer Fries 5.  Tobacco abuse.  Smoking cessation was discussed with patient in detail by admitting Dr 6.  Elevated troponin level, likely secondary demand ischemia       All the records are reviewed and case discussed with Care Management/Social Worker. Management plans discussed with the patient, family (daughter at bedside) and they are in agreement.  CODE STATUS: Full Code  TOTAL TIME TAKING CARE OF THIS PATIENT: 35 minutes.   More than 50% of the time was spent in counseling/coordination of care: YES  POSSIBLE D/C IN 2-3 DAYS, DEPENDING ON CLINICAL CONDITION.   Max Sane M.D on 12/26/2017 at 5:47 PM  Between 7am to 6pm - Pager - [phone]  After 6pm go to [email or web address] - Proofreader  [facility]  Office  [phone]  CC: Primary care physician; [name], MD  Note: This dictation was prepared with Dragon dictation along with smaller phrase technology. Any transcriptional errors that result from this process are unintentional.

## 2017-12-26 NOTE — Progress Notes (Signed)
If patient gets transferred to floor, please place on 1-c   

## 2017-12-26 NOTE — H&P (Signed)
[facility] NAME: Leslie Sims    MR#:  [id number]  DATE OF BIRTH:  03/26/1951  DATE OF ADMISSION:  12/25/2017  PRIMARY CARE PHYSICIAN: [name], MD   REQUESTING/REFERRING PHYSICIAN:   CHIEF COMPLAINT:   Chief Complaint  Patient presents with  . Respiratory Distress    HISTORY OF PRESENT ILLNESS: Leslie Sims  is a 67 y.o. male with a known history of COPD on chronic 2 L oxygen at home, CHF and anxiety disorder.  Patient is a heavy smoker for many years. He was brought to emergency room and acute respiratory distress.  Per EMS, initially, oxygen saturation was in the low 70s on 3 L nasal cannula oxygen.  In the emergency room he was placed on BiPAP some improvement, but he continues to require BiPAP use.  Patient has been having shortness of breath and productive cough for the past 7-10 days.  He was seen in the emergency room 2 days ago for similar symptoms.  He was discharged on steroids and nebulizer treatment that did not help.  Chest x-ray shows acute bronchitis changes, no infiltrate.  WBC is elevated at 17,000.  Troponin level is mildly elevated at 0.08. He denies any fever or chills although he did feel hot at home, but never checked her temperature.  No chest pain, no nausea, vomiting, abdominal pain, diarrhea or bleeding.  PAST MEDICAL HISTORY:   Past Medical History:  Diagnosis Date  . Allergic rhinitis   . Anxiety   . Atherosclerosis   . Back pain   . Bronchitis    09-14-11  . CARDIOMYOPATHY 03/24/2010   did not tolerate Toprol and Lisinopril.   . Chest pain   . CHF (congestive heart failure) ([place])   . Chronic airway obstruction, not elsewhere classified   . Colon polyps 08/2012   colonoscopy; multiple colon polyps; repeat colonoscopy in one year.  [name] COPD (chronic obstructive pulmonary disease) ([place])   . Coronary artery disease 11/2009   Anterior MI with late presentation 11/2009. Cath showed a 100%  proximal LAD occlusion, 99% mid RCA. Had a PCI and 2 DES to both LAD and RCA. EF was 35%. Nuclear stress test 05/13: prior anterior/inferior infarcts without ischemic, EF 43%, cardiac cath in 02/14: Widely patent stents with no other obstructive disease. EF 40%   . Depression   . Diabetes mellitus without complication ([place])   . GERD (gastroesophageal reflux disease)   . Headache(784.0)   . Helicobacter pylori (H. pylori)   . Hemorrhoids   . Hernia    inguinal  . Hyperlipidemia   . Hypertension   . Insomnia   . MI (myocardial infarction) ([place])   . Myalgia   . Peptic ulcer   . Shortness of breath dyspnea   . Status post dilation of esophageal narrowing 2000  . Thyroid dysfunction   . Tinea pedis   . Wears dentures    partial upper    PAST SURGICAL HISTORY:  Past Surgical History:  Procedure Laterality Date  . Admission  12/20/2012   COPD exacerbation.  [place].  [name] CARDIAC CATHETERIZATION    . CARDIAC CATHETERIZATION  02/05/13   [facility]  . CARDIAC CATHETERIZATION  10/14   [facility] : patent stents with no change in anatomy. EF: 40$  . CARDIAC CATHETERIZATION Left 11/12/2016   Procedure: Left Heart Cath and Coronary Angiography;  Surgeon: Wellington Hampshire, MD;  Location: [facility];  Service: Cardiovascular;  Laterality: Left;  . COLONOSCOPY    . COLONOSCOPY WITH PROPOFOL N/A 05/18/2016   Procedure: COLONOSCOPY WITH PROPOFOL;  Surgeon: Lucilla Lame, MD;  Location: [facility] ENDOSCOPY;  Service: Endoscopy;  Laterality: N/A;  . CORONARY ANGIOPLASTY WITH STENT PLACEMENT  09/2009   LAD 3.0 X23 mm Xience DES, RCA: 4.0 X 15 mm Xience DES  . ELECTROMAGNETIC NAVIGATION BROCHOSCOPY N/A 10/27/2017   Procedure: ELECTROMAGNETIC NAVIGATION BRONCHOSCOPY;  Surgeon: Flora Lipps, MD;  Location: [facility] ORS;  Service: Cardiopulmonary;  Laterality: N/A;  . ESOPHAGEAL DILATION    . ESOPHAGOGASTRODUODENOSCOPY  2008  . ESOPHAGOGASTRODUODENOSCOPY  08/20/2012  . HERNIA REPAIR  07/20/2012   L inguinal hernia repair   . PENILE PROSTHESIS IMPLANT      SOCIAL HISTORY:  Social History   Tobacco Use  . Smoking status: Current Every Day Smoker    Packs/day: 1.50    Years: 42.00    Pack years: 63.00    Types: Cigarettes  . Smokeless tobacco: Never Used  . Tobacco comment: smoking 15-18 cigarettes daily  Substance Use Topics  . Alcohol use: No    Alcohol/week: 0.0 oz    FAMILY HISTORY:  Family History  Problem Relation Age of Onset  . Heart attack Brother        Brother #1  . Diabetes Brother   . Hypertension Brother        #3  . Coronary artery disease Father 100       deceased  . Heart attack Father   . Diabetes Father   . Heart disease Father   . COPD Mother 53       deceased  . Alcohol abuse Sister        polysubstance abuse  . COPD Sister   . Lung cancer Sister   . Alcohol abuse Sister        polysubstance abuse  . Penile cancer Brother   . Diabetes Brother     DRUG ALLERGIES:  Allergies  Allergen Reactions  . Effexor Xr [Venlafaxine Hcl Er]   . Wellbutrin [Bupropion] Other (See Comments)    Unknown/"made me feel real funny"    REVIEW OF SYSTEMS:   CONSTITUTIONAL: No fever. Pt c/o fatigue or weakness.  EYES: No blurred or double vision.  EARS, NOSE, AND THROAT: No tinnitus or ear pain.  RESPIRATORY: Pt c/o cough, shortness of breath, wheezing. No hemoptysis.  CARDIOVASCULAR: No chest pain, orthopnea, edema.  GASTROINTESTINAL: No nausea, vomiting, diarrhea or abdominal pain.  GENITOURINARY: No dysuria, hematuria.  ENDOCRINE: No polyuria, nocturia,  HEMATOLOGY: No easy bruising or bleeding. SKIN: No rash or lesion. MUSCULOSKELETAL: H/o of osteoarthritis.   NEUROLOGIC: No focal weakness.  PSYCHIATRY: No anxiety or depression.   MEDICATIONS AT HOME:  Prior to Admission medications   Medication Sig Start Date End Date Taking? Authorizing Provider  albuterol (VENTOLIN HFA) 108 (90 Base) MCG/ACT inhaler USE 2 INHALATIONS EVERY 6 HOURS AS NEEDED FOR WHEEZING 08/19/17   Yes [name], MD  aspirin 81 MG tablet Take 81 mg by mouth daily.     Yes [provider]  atorvastatin (LIPITOR) 40 MG tablet Take 1 tablet (40 mg total) by mouth daily. 01/15/17  Yes [name], MD  Fluticasone-Salmeterol (ADVAIR DISKUS) 500-50 MCG/DOSE AEPB Inhale 1 puff into the lungs 2 (two) times daily. 07/27/17  Yes [name], MD  furosemide (LASIX) 20 MG tablet Take 1 tablet (20 mg total) by mouth daily. 07/27/17 12/25/17 Yes [name], MD  ipratropium-albuterol (DUONEB)  0.5-2.5 (3) MG/3ML SOLN INHALE 1 VIAL BY NEBULIZER EVERY 6 HOURS AS NEEDED 09/12/17  Yes [name], MD  LORazepam (ATIVAN) 1 MG tablet Take 1 tablet (1 mg total) by mouth 2 (two) times daily. Patient taking differently: Take 0.5 mg by mouth every 6 (six) hours as needed.  12/23/17 12/23/18 Yes Earleen Newport, MD  losartan (COZAAR) 25 MG tablet Take 1 tablet (25 mg total) by mouth daily. 02/17/17  Yes Minna Merritts, MD  mirtazapine (REMERON) 15 MG tablet Take 1 tablet (15 mg total) by mouth at bedtime. 10/05/17  Yes [name], MD  Multiple Vitamin (MULTIVITAMIN) capsule Take 1 capsule by mouth daily.   Yes [provider]  nitroGLYCERIN (NITROSTAT) 0.4 MG SL tablet Place 1 tablet (0.4 mg total) under the tongue every 5 (five) minutes as needed. 10/17/17  Yes Wellington Hampshire, MD  sertraline (ZOLOFT) 100 MG tablet Take 1 tablet (100 mg total) by mouth at bedtime. 11/30/17  Yes [name], MD  sucralfate (CARAFATE) 1 GM/10ML suspension Take 10 mLs (1 g total) by mouth 4 (four) times daily -  with meals and at bedtime. 07/15/17  Yes [name], MD  tiotropium (SPIRIVA HANDIHALER) 18 MCG inhalation capsule Place 1 capsule (18 mcg total) into inhaler and inhale daily. 07/27/17  Yes [name], MD  albuterol (PROVENTIL) (2.5 MG/3ML) 0.083% nebulizer solution USE 1 VIAL (3 ML) BY NEBULIZATION EVERY 6 HOURS AS NEEDED FOR WHEEZING OR SHORTNESS OF BREATH Patient not taking:  Reported on 12/25/2017 05/23/17   [name], MD  azelastine (ASTELIN) 0.1 % nasal spray Place 2 sprays into both nostrils 2 (two) times daily. Use in each nostril as directed Patient not taking: Reported on 12/25/2017 03/01/17   [name], MD  fluticasone [facility]) 50 MCG/ACT nasal spray Place 1 spray into both nostrils 2 (two) times daily. Patient not taking: Reported on 12/25/2017 03/01/17   [name], MD  ketoconazole (NIZORAL) 2 % cream Apply 1 application topically 2 (two) times daily. 08/19/17   [name], MD  levalbuterol Penne Lash) 1.25 MG/3ML nebulizer solution Inhale into the lungs.    [provider]  methylPREDNISolone (MEDROL DOSEPAK) 4 MG TBPK tablet Take as directed. Patient not taking: Reported on 12/25/2017 12/07/17   Lloyd Huger, MD  predniSONE (DELTASONE) 20 MG tablet 3 tabs po once, then 2 tabs po qd for 2 days, then 1 tab po qd for 2 days, then half a tab po qd x 2 days Patient not taking: Reported on 12/25/2017 12/16/17   Norval Gable, MD  sodium chloride (OCEAN) 0.65 % SOLN nasal spray Place 2 sprays into both nostrils every 2 (two) hours while awake. 11/18/16 10/27/17  [name], MD      PHYSICAL EXAMINATION:   VITAL SIGNS: Blood pressure 129/80, pulse 96, temperature 97.7 F (36.5 C), temperature source Axillary, resp. rate 17, height 5\' 10"  (1.778 m), weight 63.4 kg (139 lb 12.4 oz), SpO2 95 %.  GENERAL:  67 y.o.-year-old patient lying in the bed, in mild distress, secondary to shortness of breath, on BiPAP. EYES: Pupils equal, round, reactive to light and accommodation. No scleral icterus. Extraocular muscles intact.  HEENT: Head atraumatic, normocephalic. Oropharynx and nasopharynx clear.  NECK:  Supple, no jugular venous distention. No thyroid enlargement, no tenderness.  LUNGS: Reduced breath sounds and wheezing noted bilaterally.  Patient is using accessory muscles for breathing.  CARDIOVASCULAR: S1, S2 normal. No S3/S4.  ABDOMEN: Soft, nontender, nondistended. Bowel sounds present. No organomegaly or mass.  EXTREMITIES: No pedal edema, cyanosis, or clubbing.  NEUROLOGIC: No focal weakness; gait not checked.  PSYCHIATRIC: The patient is alert and oriented x 3.  SKIN: No obvious rash, lesion, or ulcer.   LABORATORY PANEL:   CBC Recent Labs  Lab 12/23/17 1455 12/25/17 2050  WBC 17.1* 7.4  HGB 14.4 14.1  HCT 43.9 43.1  PLT 231 210  MCV 92.0 92.2  MCH 30.2 30.1  MCHC 32.8 32.7  RDW 13.5 13.5  LYMPHSABS 1.2 0.6*  MONOABS 1.8* 2.0*  EOSABS 0.5 0.1  BASOSABS 0.0 0.0   ------------------------------------------------------------------------------------------------------------------  Chemistries  Recent Labs  Lab 12/23/17 1455 12/25/17 2050  NA 141 139  K 3.7 4.5  CL 97* 93*  CO2 35* 36*  GLUCOSE 125* 164*  BUN 13 19  CREATININE 0.82 0.70  CALCIUM 9.3 8.9  AST  --  22  ALT  --  29  ALKPHOS  --  66  BILITOT  --  0.7   ------------------------------------------------------------------------------------------------------------------ estimated creatinine clearance is 83.7 mL/min (by C-G formula based on SCr of 0.7 mg/dL). ------------------------------------------------------------------------------------------------------------------ No results for input(s): TSH, T4TOTAL, T3FREE, THYROIDAB in the last 72 hours.  Invalid input(s): FREET3   Coagulation profile No results for input(s): INR, PROTIME in the last 168 hours. ------------------------------------------------------------------------------------------------------------------- No results for input(s): DDIMER in the last 72 hours. -------------------------------------------------------------------------------------------------------------------  Cardiac Enzymes Recent Labs  Lab 12/23/17 1455 12/25/17 2050  TROPONINI 0.03* 0.08*    ------------------------------------------------------------------------------------------------------------------ Invalid input(s): POCBNP  ---------------------------------------------------------------------------------------------------------------  Urinalysis    Component Value Date/Time   COLORURINE YELLOW 02/28/2016 1125   APPEARANCEUR CLEAR 02/28/2016 1125   APPEARANCEUR Clear 01/10/2013 1205   LABSPEC 1.010 02/28/2016 1125   LABSPEC 1.004 01/10/2013 1205   PHURINE 6.5 02/28/2016 1125   GLUCOSEU NEGATIVE 02/28/2016 1125   GLUCOSEU 50 mg/dL 01/10/2013 1205   HGBUR NEGATIVE 02/28/2016 1125   BILIRUBINUR negative 07/27/2017 1259   BILIRUBINUR neg 05/10/2014 1208   BILIRUBINUR Negative 01/10/2013 1205   KETONESUR negative 07/27/2017 1259   KETONESUR NEGATIVE 02/28/2016 1125   PROTEINUR negative 07/27/2017 1259   PROTEINUR NEGATIVE 02/28/2016 1125   UROBILINOGEN 0.2 07/27/2017 1259   NITRITE Negative 07/27/2017 1259   NITRITE NEGATIVE 02/28/2016 1125   LEUKOCYTESUR Negative 07/27/2017 1259   LEUKOCYTESUR Negative 01/10/2013 1205     RADIOLOGY: Dg Chest Port 1 View  Result Date: 12/25/2017 CLINICAL DATA:  Patient coming from home via EMS for respiratory distress for sats in high 70's -low 80's on 2 L [place]. Patient received 1 dueneb and 125mg  solumedrol. Placed on c-pap and 18 gauge IV started. EXAM: PORTABLE CHEST 1 VIEW COMPARISON:  12/23/2017 FINDINGS: Lungs are hyperinflated. Heart size is normal. There is perihilar peribronchial thickening. No focal consolidations or pleural effusions. No pulmonary edema. Mass in the right upper lobe measures 1.4 x 1.7 cm. Mass in the left upper lobe measures 1.4 x 1.1 cm. IMPRESSION: 1. Emphysematous and bronchitic changes. 2. Bilateral upper lobe masses, previously evaluated by PET and CT exam. Electronically Signed   By: Nolon Nations M.D.   On: 12/25/2017 22:00    EKG: Orders placed or performed during the hospital encounter of  12/25/17  . ED EKG 12-Lead  . ED EKG 12-Lead  . EKG 12-Lead  . EKG 12-Lead    IMPRESSION AND PLAN:  1.  Acute hypoxemic respiratory failure.  Started oxygen treatment, BiPAP, antibiotics, steroids and nebulizer treatments.  Will admit patient to stepdown unit and  continue to monitor clinically closely. 2.  Acute COPD exacerbation, treatment as above. 3.  Acute bronchitis, see treatment above under #1. 4.  Benign bilateral lung nodules, status post recent biopsy. 5.  Tobacco abuse.  Smoking cessation was discussed with patient in detail.  6.  Elevated troponin level, likely secondary to acute respiratory failure.  No acute changes on EKG. Continue ASA and follow troponin level.  All the records are reviewed and case discussed with ED provider. Management plans discussed with the patient, family and they are in agreement.  CODE STATUS:    Code Status Orders  (From admission, onward)        Start     Ordered   12/26/17 0130  Full code  Continuous     12/26/17 0129    Code Status History    Date Active Date Inactive Code Status Order ID Comments User Context   This patient has a current code status but no historical code status.       TOTAL TIME TAKING CARE OF THIS PATIENT: 40 minutes.    Amelia Jo M.D on 12/26/2017 at 2:18 AM  Between 7am to 6pm - Pager - [phone]  After 6pm go to [email or web address] - password [email or web address] [facility]  Office  [phone]  CC: Primary care physician; [name], MD

## 2017-12-26 NOTE — Consult Note (Signed)
[facility] Consultation    ASSESSMENT/PLAN   COPD exacerbation. Agree with management, albuterol, Atrovent, Solu-Medrol, azithromycin, Rocephin. Has been weaned off of noninvasive ventilation presently on nasal cannula with stable oxygenation will monitor closely today.  Abnormal CT scan of the chest. He has multiple nodules that have been added for PET scan uptake. Has been followed by Dr.Kasa in the outpatient setting  Name: Leslie Sims [facility] MRN: [id number] DOB: October 14, 1951    ADMISSION DATE:  12/25/2017 CONSULTATION DATE:  12/26/2017  REFERRING MD :  Hospitalist  CHIEF COMPLAINT:  Shortness of breath   HISTORY OF PRESENT ILLNESS:  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 Patient was noted to be desaturating with oxygen levels in the 70s on 3 L nasal cannula. He was started initially on noninvasive ventilation with eventual improvement. He was recently seen in emergency department with similar symptoms and was discharged on steroids. Pertinent labs in emergency department was white count of 17,000 and a mildly elevated troponin I 0.08. CO2 is elevated at 35 consistent with chronic hypercapnia  PAST MEDICAL HISTORY :  Past Medical History:  Diagnosis Date  . Allergic rhinitis   . Anxiety   . Atherosclerosis   . Back pain   . Bronchitis    09-14-11  . CARDIOMYOPATHY 03/24/2010   did not tolerate Toprol and Lisinopril.   . Chest pain   . CHF (congestive heart failure) ([place])   . Chronic airway obstruction, not elsewhere classified   . Colon polyps 08/2012   colonoscopy; multiple colon polyps; repeat colonoscopy in one year.  [name]  COPD (chronic obstructive pulmonary disease) ([place])   . Coronary artery disease 11/2009   Anterior MI with late presentation 11/2009. Cath showed a 100% proximal LAD occlusion, 99% mid RCA. Had a PCI and 2 DES to both LAD and RCA. EF was 35%. Nuclear stress test 05/13: prior anterior/inferior infarcts without ischemic, EF 43%, cardiac cath in 02/14: Widely patent stents with no other obstructive disease. EF 40%   . Depression   . Diabetes mellitus without complication ([place])   . GERD (gastroesophageal reflux disease)   . Headache(784.0)   . Helicobacter pylori (H. pylori)   . Hemorrhoids   . Hernia    inguinal  . Hyperlipidemia   . Hypertension   . Insomnia   . MI (myocardial infarction) ([place])   . Myalgia   . Peptic ulcer   . Shortness of breath dyspnea   . Status post dilation of esophageal narrowing 2000  . Thyroid dysfunction   . Tinea pedis   . Wears dentures    partial upper   Past Surgical History:  Procedure Laterality Date  . Admission  12/20/2012   COPD exacerbation.  [place].  [name] CARDIAC CATHETERIZATION    . CARDIAC CATHETERIZATION  02/05/13   [facility]  . CARDIAC CATHETERIZATION  10/14   [facility] : patent stents with no change in anatomy. EF: 40$  . CARDIAC CATHETERIZATION Left 11/12/2016   Procedure: Left Heart Cath and Coronary Angiography;  Surgeon: Wellington Hampshire, MD;  Location: [facility];  Service: Cardiovascular;  Laterality: Left;  . COLONOSCOPY    . COLONOSCOPY WITH PROPOFOL N/A 05/18/2016   Procedure: COLONOSCOPY WITH PROPOFOL;  Surgeon:  Lucilla Lame, MD;  Location: [facility] ENDOSCOPY;  Service: Endoscopy;  Laterality: N/A;  . CORONARY ANGIOPLASTY WITH STENT PLACEMENT  09/2009   LAD 3.0 X23 mm Xience DES, RCA: 4.0 X 15 mm Xience DES  . ELECTROMAGNETIC NAVIGATION BROCHOSCOPY N/A 10/27/2017   Procedure: ELECTROMAGNETIC NAVIGATION BRONCHOSCOPY;  Surgeon: Flora Lipps, MD;  Location: [facility] ORS;  Service: Cardiopulmonary;  Laterality: N/A;  . ESOPHAGEAL DILATION    .  ESOPHAGOGASTRODUODENOSCOPY  2008  . ESOPHAGOGASTRODUODENOSCOPY  08/20/2012  . HERNIA REPAIR  07/20/2012   L inguinal hernia repair  . PENILE PROSTHESIS IMPLANT     Prior to Admission medications   Medication Sig Start Date End Date Taking? Authorizing Provider  albuterol (VENTOLIN HFA) 108 (90 Base) MCG/ACT inhaler USE 2 INHALATIONS EVERY 6 HOURS AS NEEDED FOR WHEEZING 08/19/17  Yes [name], MD  aspirin 81 MG tablet Take 81 mg by mouth daily.     Yes [provider]  atorvastatin (LIPITOR) 40 MG tablet Take 1 tablet (40 mg total) by mouth daily. 01/15/17  Yes [name], MD  Fluticasone-Salmeterol (ADVAIR DISKUS) 500-50 MCG/DOSE AEPB Inhale 1 puff into the lungs 2 (two) times daily. 07/27/17  Yes [name], MD  furosemide (LASIX) 20 MG tablet Take 1 tablet (20 mg total) by mouth daily. 07/27/17 12/25/17 Yes [name], MD  ipratropium-albuterol (DUONEB) 0.5-2.5 (3) MG/3ML SOLN INHALE 1 VIAL BY NEBULIZER EVERY 6 HOURS AS NEEDED 09/12/17  Yes [name], MD  LORazepam (ATIVAN) 1 MG tablet Take 1 tablet (1 mg total) by mouth 2 (two) times daily. Patient taking differently: Take 0.5 mg by mouth every 6 (six) hours as needed.  12/23/17 12/23/18 Yes Earleen Newport, MD  losartan (COZAAR) 25 MG tablet Take 1 tablet (25 mg total) by mouth daily. 02/17/17  Yes Minna Merritts, MD  mirtazapine (REMERON) 15 MG tablet Take 1 tablet (15 mg total) by mouth at bedtime. 10/05/17  Yes [name], MD  Multiple Vitamin (MULTIVITAMIN) capsule Take 1 capsule by mouth daily.   Yes [provider]  nitroGLYCERIN (NITROSTAT) 0.4 MG SL tablet Place 1 tablet (0.4 mg total) under the tongue every 5 (five) minutes as needed. 10/17/17  Yes Wellington Hampshire, MD  sertraline (ZOLOFT) 100 MG tablet Take 1 tablet (100 mg total) by mouth at bedtime. 11/30/17  Yes [name], MD  sucralfate (CARAFATE) 1 GM/10ML suspension Take 10 mLs (1 g total) by mouth 4 (four) times daily -  with  meals and at bedtime. 07/15/17  Yes [name], MD  tiotropium (SPIRIVA HANDIHALER) 18 MCG inhalation capsule Place 1 capsule (18 mcg total) into inhaler and inhale daily. 07/27/17  Yes [name], MD  albuterol (PROVENTIL) (2.5 MG/3ML) 0.083% nebulizer solution USE 1 VIAL (3 ML) BY NEBULIZATION EVERY 6 HOURS AS NEEDED FOR WHEEZING OR SHORTNESS OF BREATH Patient not taking: Reported on 12/25/2017 05/23/17   [name], MD  azelastine (ASTELIN) 0.1 % nasal spray Place 2 sprays into both nostrils 2 (two) times daily. Use in each nostril as directed Patient not taking: Reported on 12/25/2017 03/01/17   [name], MD  fluticasone [facility]) 50 MCG/ACT nasal spray Place 1 spray into both nostrils 2 (two) times daily. Patient not taking: Reported on 12/25/2017 03/01/17   [name], MD  ketoconazole (NIZORAL) 2 % cream Apply 1 application topically 2 (two) times daily. 08/19/17   [name], MD  levalbuterol Penne Lash) 1.25 MG/3ML nebulizer  solution Inhale into the lungs.    [provider]  methylPREDNISolone (MEDROL DOSEPAK) 4 MG TBPK tablet Take as directed. Patient not taking: Reported on 12/25/2017 12/07/17   Lloyd Huger, MD  predniSONE (DELTASONE) 20 MG tablet 3 tabs po once, then 2 tabs po qd for 2 days, then 1 tab po qd for 2 days, then half a tab po qd x 2 days Patient not taking: Reported on 12/25/2017 12/16/17   Norval Gable, MD  sodium chloride (OCEAN) 0.65 % SOLN nasal spray Place 2 sprays into both nostrils every 2 (two) hours while awake. 11/18/16 10/27/17  [name], MD   Allergies  Allergen Reactions  . Effexor Xr [Venlafaxine Hcl Er]   . Wellbutrin [Bupropion] Other (See Comments)    Unknown/"made me feel real funny"    FAMILY HISTORY:  Family History  Problem Relation Age of Onset  . Heart attack Brother        Brother #1  . Diabetes Brother   . Hypertension Brother        #3  . Coronary artery disease Father 82       deceased  . Heart  attack Father   . Diabetes Father   . Heart disease Father   . COPD Mother 55       deceased  . Alcohol abuse Sister        polysubstance abuse  . COPD Sister   . Lung cancer Sister   . Alcohol abuse Sister        polysubstance abuse  . Penile cancer Brother   . Diabetes Brother    SOCIAL HISTORY:  reports that he has been smoking cigarettes.  He has a 63.00 pack-year smoking history. he has never used smokeless tobacco. He reports that he does not drink alcohol or use drugs.  REVIEW OF SYSTEMS:   Constitutional: Feels well. Cardiovascular: No chest pain.  Pulmonary: Denies dyspnea.   The remainder of systems were reviewed and were found to be negative other than what is documented in the HPI.    VITAL SIGNS: Temp:  [97.4 F (36.3 C)-97.8 F (36.6 C)] 97.8 F (36.6 C) (01/07 0400) Pulse Rate:  [88-114] 89 (01/07 0500) Resp:  [15-28] 26 (01/07 0500) BP: (111-178)/(71-160) 146/91 (01/07 0500) SpO2:  [92 %-99 %] 97 % (01/07 0500) FiO2 (%):  [30 %-36 %] 36 % (01/07 0153) Weight:  [63.4 kg (139 lb 12.4 oz)-66.7 kg (147 lb)] 63.4 kg (139 lb 12.4 oz) (01/07 0137) HEMODYNAMICS:   VENTILATOR SETTINGS: FiO2 (%):  [30 %-36 %] 36 % INTAKE / OUTPUT:  Intake/Output Summary (Last 24 hours) at 12/26/2017 0932 Last data filed at 12/26/2017 0321 Gross per 24 hour  Intake 300 ml  Output -  Net 300 ml    Physical Examination:   VS: BP (!) 146/91   Pulse 89   Temp 97.8 F (36.6 C) (Oral)   Resp (!) 26   Ht 5\' 10"  (1.778 m)   Wt 63.4 kg (139 lb 12.4 oz)   SpO2 97%   BMI 20.06 kg/m   General Appearance: No distress  Neuro:without focal findings, mental status, speech normal,. HEENT: PERRLA, EOM intact, no ptosis, no other lesions noticed;  Pulmonary: normal breath sounds., diaphragmatic excursion normal. CardiovascularNormal S1,S2.  No m/r/g.    Abdomen: Benign, Soft, non-tender, No masses, hepatosplenomegaly, No lymphadenopathy Renal:  No costovertebral tenderness  GU:  Not  performed at this time. Endoc: No evident thyromegaly, no signs of acromegaly.  Skin:   warm, no rashes, no ecchymosis  Extremities: normal, no cyanosis, clubbing, no edema, warm with normal capillary refill.    LABS: Reviewed   LABORATORY PANEL:   CBC Recent Labs  Lab 12/26/17 0507  WBC 4.5  HGB 13.0  HCT 39.3*  PLT 176    Chemistries  Recent Labs  Lab 12/25/17 2050 12/26/17 0507  NA 139 138  K 4.5 4.2  CL 93* 95*  CO2 36* 35*  GLUCOSE 164* 205*  BUN 19 20  CREATININE 0.70 0.68  CALCIUM 8.9 9.0  AST 22  --   ALT 29  --   ALKPHOS 66  --   BILITOT 0.7  --     Recent Labs  Lab 12/26/17 0126  GLUCAP 146*   No results for input(s): PHART, PCO2ART, PO2ART in the last 168 hours. Recent Labs  Lab 12/25/17 2050  AST 22  ALT 29  ALKPHOS 66  BILITOT 0.7  ALBUMIN 3.9    Cardiac Enzymes Recent Labs  Lab 12/25/17 2050  TROPONINI 0.08*    RADIOLOGY:  Dg Chest Port 1 View  Result Date: 12/25/2017 CLINICAL DATA:  Patient coming from home via EMS for respiratory distress for sats in high 70's -low 80's on 2 L [place]. Patient received 1 dueneb and 125mg  solumedrol. Placed on c-pap and 18 gauge IV started. EXAM: PORTABLE CHEST 1 VIEW COMPARISON:  12/23/2017 FINDINGS: Lungs are hyperinflated. Heart size is normal. There is perihilar peribronchial thickening. No focal consolidations or pleural effusions. No pulmonary edema. Mass in the right upper lobe measures 1.4 x 1.7 cm. Mass in the left upper lobe measures 1.4 x 1.1 cm. IMPRESSION: 1. Emphysematous and bronchitic changes. 2. Bilateral upper lobe masses, previously evaluated by PET and CT exam. Electronically Signed   By: Nolon Nations M.D.   On: 12/25/2017 22:00    Hermelinda Dellen, DO  12/26/2017, 9:32 AM

## 2017-12-26 NOTE — Progress Notes (Signed)
eLink Physician-Brief Progress Note Patient Name: Leslie Sims [facility] DOB: 06-Sep-1951 MRN: [id number]   Date of Service  12/26/2017  HPI/Events of Note  67 yo male with PMH of COPD. Admitted with AECOPD. PCCM asked to assume care in stepdown unit. Now on BiPAP. Sat = 99% and RR = 13.   eICU Interventions  No new orders.      Intervention Category Evaluation Type: New Patient Evaluation  Lysle Dingwall 12/26/2017, 1:51 AM

## 2017-12-26 NOTE — Telephone Encounter (Signed)
error 

## 2017-12-27 ENCOUNTER — Other Ambulatory Visit: Payer: Self-pay

## 2017-12-27 LAB — GLUCOSE, CAPILLARY
GLUCOSE-CAPILLARY: 131 mg/dL — AB (ref 65–99)
GLUCOSE-CAPILLARY: 233 mg/dL — AB (ref 65–99)
Glucose-Capillary: 175 mg/dL — ABNORMAL HIGH (ref 65–99)
Glucose-Capillary: 145 mg/dL — ABNORMAL HIGH (ref 65–99)

## 2017-12-27 LAB — HIV ANTIBODY (ROUTINE TESTING W REFLEX): HIV Screen 4th Generation wRfx: NONREACTIVE

## 2017-12-27 MED ORDER — INSULIN ASPART 100 UNIT/ML ~~LOC~~ SOLN
0.0000 [IU] | Freq: Three times a day (TID) | SUBCUTANEOUS | Status: DC
  Administered 2017-12-27: 17:00:00 2 [IU] via SUBCUTANEOUS
  Administered 2017-12-28 (×3): 1 [IU] via SUBCUTANEOUS
  Filled 2017-12-27 (×5): qty 1

## 2017-12-27 MED ORDER — IPRATROPIUM-ALBUTEROL 0.5-2.5 (3) MG/3ML IN SOLN
3.0000 mL | Freq: Four times a day (QID) | RESPIRATORY_TRACT | Status: DC
  Administered 2017-12-27 – 2017-12-30 (×13): 3 mL via RESPIRATORY_TRACT
  Filled 2017-12-27 (×13): qty 3

## 2017-12-27 MED ORDER — INSULIN ASPART 100 UNIT/ML ~~LOC~~ SOLN
0.0000 [IU] | Freq: Every day | SUBCUTANEOUS | Status: DC
  Administered 2017-12-29: 21:00:00 2 [IU] via SUBCUTANEOUS
  Filled 2017-12-27: qty 1

## 2017-12-27 MED ORDER — INSULIN ASPART 100 UNIT/ML ~~LOC~~ SOLN
1.0000 [IU] | SUBCUTANEOUS | Status: DC
  Administered 2017-12-27: 3 [IU] via SUBCUTANEOUS
  Filled 2017-12-27: qty 1

## 2017-12-27 NOTE — Care Management (Addendum)
Patient weaned from [place] per note on 12/26/17 however still requiring SDU due to respiratory risks. RNCM will follow. Supplemental O2 appears to be acute. Per progression rounds, patient only uses O2 at night/while sleeping.  He will require a new O2 assessment in order to fulfill chronic O2 need prior to discharge.

## 2017-12-27 NOTE — Progress Notes (Signed)
[facility] Progess Note    SYNOPSIS   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    ASSESSMENT/PLAN   COPD exacerbation. Agree with management, albuterol, Atrovent, Solu-Medrol, azithromycin, Rocephin. Has been weaned off of noninvasive ventilation presently on nasal cannula stable for floor transfer  Abnormal CT scan of the chest. He has multiple nodules that have been avid for PET scan uptake. Has been followed by Dr.Kasa in the outpatient setting S/P biopsy, Dr. Mortimer Fries to follow up.   Name: Leslie Sims MRN: [id number] DOB: November 17, 1951    ADMISSION DATE:  12/25/2017  SUBJECTIVE:   Patient had no difficulties yesterday, did not require noninvasive ventilation, complaining of a headache but other than that just feels fatigued. States his shortness of breath is back to baseline  VITAL SIGNS: Temp:  [97.6 F (36.4 C)-98.2 F (36.8 C)] 97.6 F (36.4 C) (01/07 1930) Pulse Rate:  [76-95] 85 (01/08 0300) Resp:  [11-29] 29 (01/08 0300) BP: (94-137)/(55-86) 114/60 (01/08 0300) SpO2:  [87 %-98 %] 87 % (01/08 0300) FiO2 (%):  [32 %] 32 % (01/07 1954)  PHYSICAL EXAMINATION: Physical Examination:   VS: BP 114/60   Pulse 85   Temp 97.6 F (36.4 C) (Axillary)   Resp (!) 29   Ht 5\' 10"  (1.778 m)   Wt 63.4 kg (139 lb 12.4 oz)   SpO2 (!) 87%   BMI 20.06 kg/m   General Appearance: No distress  Neuro:without focal findings, mental status normal. HEENT: PERRLA, EOM intact. Pulmonary: Diminished breath sounds but clear, prolonged expiratory phase CardiovascularNormal S1,S2.  No m/r/g.   Abdomen: Benign, Soft,  non-tender.. Skin:   warm, no rashes, no ecchymosis  Extremities: normal, no cyanosis, clubbing.    LABORATORY PANEL:   CBC Recent Labs  Lab 12/26/17 0507  WBC 4.5  HGB 13.0  HCT 39.3*  PLT 176    Chemistries  Recent Labs  Lab 12/25/17 2050 12/26/17 0507  NA 139 138  K 4.5 4.2  CL 93* 95*  CO2 36* 35*  GLUCOSE 164* 205*  BUN 19 20  CREATININE 0.70 0.68  CALCIUM 8.9 9.0  AST 22  --   ALT 29  --   ALKPHOS 66  --   BILITOT 0.7  --     Recent Labs  Lab 12/26/17 0126 12/27/17 0759  GLUCAP 146* 131*   No results for input(s): PHART, PCO2ART, PO2ART in the last 168 hours. Recent Labs  Lab 12/25/17 2050  AST 22  ALT 29  ALKPHOS 66  BILITOT 0.7  ALBUMIN 3.9    Cardiac Enzymes Recent Labs  Lab 12/25/17 2050  TROPONINI 0.08*    RADIOLOGY:  Dg Chest Port 1 View  Result Date: 12/25/2017 CLINICAL DATA:  Patient coming from home via EMS for respiratory distress for sats in high 70's -low 80's on 2 L [place]. Patient received 1 dueneb and 125mg  solumedrol. Placed on c-pap and 18 gauge IV started. EXAM: PORTABLE CHEST 1 VIEW COMPARISON:  12/23/2017 FINDINGS: Lungs are hyperinflated. Heart size is normal. There is perihilar peribronchial thickening. No focal consolidations or pleural effusions. No pulmonary edema. Mass in the right upper lobe measures 1.4 x 1.7 cm. Mass in the left upper lobe measures 1.4  x 1.1 cm. IMPRESSION: 1. Emphysematous and bronchitic changes. 2. Bilateral upper lobe masses, previously evaluated by PET and CT exam. Electronically Signed   By: Nolon Nations M.D.   On: 12/25/2017 22:00     Hermelinda Dellen, DO 12/27/2017

## 2017-12-27 NOTE — Progress Notes (Signed)
[facility] [place] NAME: Leslie Sims    MR#:  [id number]  DATE OF BIRTH:  11-09-1951  SUBJECTIVE:  CHIEF COMPLAINT:   Chief Complaint  Patient presents with  . Respiratory Distress  sitting in bed, very dyspneic.  He feels about the same, c/o burning pain on chest wall - no lesions REVIEW OF SYSTEMS:  Review of Systems  Constitutional: Positive for malaise/fatigue. Negative for chills, fever and weight loss.  HENT: Negative for nosebleeds and sore throat.   Eyes: Negative for blurred vision.  Respiratory: Positive for cough and shortness of breath. Negative for wheezing.   Cardiovascular: Negative for chest pain, orthopnea, leg swelling and PND.  Gastrointestinal: Negative for abdominal pain, constipation, diarrhea, heartburn, nausea and vomiting.  Genitourinary: Negative for dysuria and urgency.  Musculoskeletal: Negative for back pain.  Skin: Negative for rash.  Neurological: Positive for weakness. Negative for dizziness, speech change, focal weakness and headaches.  Endo/Heme/Allergies: Does not bruise/bleed easily.  Psychiatric/Behavioral: Negative for depression.   DRUG ALLERGIES:   Allergies  Allergen Reactions  . Effexor Xr [Venlafaxine Hcl Er]   . Wellbutrin [Bupropion] Other (See Comments)    Unknown/"made me feel real funny"   VITALS:  Blood pressure 131/65, pulse 93, temperature (!) 97.5 F (36.4 C), temperature source Oral, resp. rate 18, height 5\' 10"  (1.778 m), weight 63.4 kg (139 lb 12.4 oz), SpO2 95 %. PHYSICAL EXAMINATION:  Physical Exam  Constitutional: He is oriented to person, place, and time. He appears malnourished. He appears unhealthy. He appears cachectic. He appears toxic. He has a sickly appearance.  HENT:  Head: Normocephalic and atraumatic.  Eyes: Conjunctivae and EOM are normal. Pupils are equal, round, and reactive to light.  Neck: Normal range of motion. Neck supple. No tracheal deviation  present. No thyromegaly present.  Cardiovascular: Normal rate, regular rhythm and normal heart sounds.  Pulmonary/Chest: Accessory muscle usage present. No tachypnea. He is in respiratory distress. He has decreased breath sounds. He has wheezes. He exhibits no tenderness.  Abdominal: Soft. Bowel sounds are normal. He exhibits no distension. There is no tenderness.  Musculoskeletal: Normal range of motion.  Neurological: He is alert and oriented to person, place, and time. No cranial nerve deficit.  Skin: Skin is warm and dry. No rash noted.  Psychiatric: Mood and affect normal.   LABORATORY PANEL:  Male CBC Recent Labs  Lab 12/26/17 0507  WBC 4.5  HGB 13.0  HCT 39.3*  PLT 176   ------------------------------------------------------------------------------------------------------------------ Chemistries  Recent Labs  Lab 12/25/17 2050 12/26/17 0507  NA 139 138  K 4.5 4.2  CL 93* 95*  CO2 36* 35*  GLUCOSE 164* 205*  BUN 19 20  CREATININE 0.70 0.68  CALCIUM 8.9 9.0  AST 22  --   ALT 29  --   ALKPHOS 66  --   BILITOT 0.7  --    RADIOLOGY:  No results found. ASSESSMENT AND PLAN:   1.  Acute hypoxemic respiratory failure: continue oxygen treatment, continue antibiotics, steroids and nebulizer treatments.  2.  Acute COPD exacerbation, treatment as above. 3.  Acute bronchitis, see treatment above under #1. 4.  Benign bilateral lung nodules, status post recent biopsy by Dr Mortimer Fries, it was non cancerous per pt 5.  Tobacco abuse.  Smoking cessation was discussed with patient in detail by admitting Dr 6.  Elevated troponin level, likely secondary demand ischemia       All the records are reviewed and  case discussed with Care Management/Social Worker. Management plans discussed with the patient, nursing and they are in agreement.  CODE STATUS: Full Code  TOTAL TIME TAKING CARE OF THIS PATIENT: 35 minutes.   More than 50% of the time was spent in counseling/coordination  of care: YES  POSSIBLE D/C IN 1-2 DAYS, DEPENDING ON CLINICAL CONDITION.   Max Sane M.D on 12/27/2017 at 4:40 PM  Between 7am to 6pm - Pager - [phone]  After 6pm go to [email or web address] - Proofreader  [facility]  Office  [phone]  CC: Primary care physician; [name], MD  Note: This dictation was prepared with Dragon dictation along with smaller phrase technology. Any transcriptional errors that result from this process are unintentional.

## 2017-12-28 ENCOUNTER — Ambulatory Visit: Payer: BLUE CROSS/BLUE SHIELD | Admitting: Family Medicine

## 2017-12-28 LAB — GLUCOSE, CAPILLARY
GLUCOSE-CAPILLARY: 150 mg/dL — AB (ref 65–99)
GLUCOSE-CAPILLARY: 128 mg/dL — AB (ref 65–99)
Glucose-Capillary: 132 mg/dL — ABNORMAL HIGH (ref 65–99)
Glucose-Capillary: 149 mg/dL — ABNORMAL HIGH (ref 65–99)
Glucose-Capillary: 173 mg/dL — ABNORMAL HIGH (ref 65–99)

## 2017-12-28 MED ORDER — AZITHROMYCIN 500 MG PO TABS
500.0000 mg | ORAL_TABLET | Freq: Every day | ORAL | Status: DC
  Administered 2017-12-28: 500 mg via ORAL
  Filled 2017-12-28 (×2): qty 1

## 2017-12-28 NOTE — Progress Notes (Signed)
PHARMACIST - PHYSICIAN COMMUNICATION DR:   Manuella Ghazi CONCERNING: Antibiotic IV to Oral Route Change Policy  RECOMMENDATION: This patient is receiving azithromycin by the intravenous route.  Based on criteria approved by the Pharmacy and Therapeutics Committee, the antibiotic(s) is/are being converted to the equivalent oral dose form(s).   DESCRIPTION: These criteria include:  Patient being treated for a respiratory tract infection, urinary tract infection, cellulitis or clostridium difficile associated diarrhea if on metronidazole  The patient is not neutropenic and does not exhibit a GI malabsorption state  The patient is eating (either orally or via tube) and/or has been taking other orally administered medications for a least 24 hours  The patient is improving clinically and has a Tmax < 100.5  If you have questions about this conversion, please contact the [place], PharmD, BCPS Clinical Pharmacist 12/28/17 7:50 AM

## 2017-12-28 NOTE — Plan of Care (Signed)
  Progressing Education: Knowledge of General Education information will improve 12/28/2017 0350 - Progressing by Denice Bors, RN Health Behavior/Discharge Planning: Ability to manage health-related needs will improve 12/28/2017 0350 - Progressing by Denice Bors, RN Clinical Measurements: Ability to maintain clinical measurements within normal limits will improve 12/28/2017 0350 - Progressing by Denice Bors, RN Will remain free from infection 12/28/2017 0350 - Progressing by Denice Bors, RN Diagnostic test results will improve 12/28/2017 0350 - Progressing by Denice Bors, RN Respiratory complications will improve 12/28/2017 0350 - Progressing by Denice Bors, RN Cardiovascular complication will be avoided 12/28/2017 0350 - Progressing by Denice Bors, RN Activity: Risk for activity intolerance will decrease 12/28/2017 0350 - Progressing by Denice Bors, RN Nutrition: Adequate nutrition will be maintained 12/28/2017 0350 - Progressing by Denice Bors, RN Coping: Level of anxiety will decrease 12/28/2017 0350 - Progressing by Denice Bors, RN Elimination: Will not experience complications related to bowel motility 12/28/2017 0350 - Progressing by Denice Bors, RN Will not experience complications related to urinary retention 12/28/2017 0350 - Progressing by Denice Bors, RN Pain Managment: General experience of comfort will improve 12/28/2017 0350 - Progressing by Denice Bors, RN Safety: Ability to remain free from injury will improve 12/28/2017 0350 - Progressing by Denice Bors, RN Skin Integrity: Risk for impaired skin integrity will decrease 12/28/2017 0350 - Progressing by Denice Bors, RN

## 2017-12-28 NOTE — Progress Notes (Signed)
[facility] at [place] NAME: Sai Zinn    MR#:  [id number]  DATE OF BIRTH:  [birth year]  SUBJECTIVE:  CHIEF COMPLAINT:   Chief Complaint  Patient presents with  . Respiratory Distress  improving, does not feel he can go home today REVIEW OF SYSTEMS:  Review of Systems  Constitutional: Positive for malaise/fatigue. Negative for chills, fever and weight loss.  HENT: Negative for nosebleeds and sore throat.   Eyes: Negative for blurred vision.  Respiratory: Positive for cough and shortness of breath. Negative for wheezing.   Cardiovascular: Negative for chest pain, orthopnea, leg swelling and PND.  Gastrointestinal: Negative for abdominal pain, constipation, diarrhea, heartburn, nausea and vomiting.  Genitourinary: Negative for dysuria and urgency.  Musculoskeletal: Negative for back pain.  Skin: Negative for rash.  Neurological: Positive for weakness. Negative for dizziness, speech change, focal weakness and headaches.  Endo/Heme/Allergies: Does not bruise/bleed easily.  Psychiatric/Behavioral: Negative for depression.   DRUG ALLERGIES:   Allergies  Allergen Reactions  . Effexor Xr [Venlafaxine Hcl Er]   . Wellbutrin [Bupropion] Other (See Comments)    Unknown/"made me feel real funny"   VITALS:  Blood pressure (!) 122/59, pulse 86, temperature 97.8 F (36.6 C), temperature source Oral, resp. rate 18, height 5\' 10"  (1.778 m), weight 62.1 kg (137 lb), SpO2 97 %. PHYSICAL EXAMINATION:  Physical Exam  Constitutional: He is oriented to person, place, and time. He appears malnourished. He appears unhealthy. He appears cachectic. He appears toxic. He has a sickly appearance.  HENT:  Head: Normocephalic and atraumatic.  Eyes: Conjunctivae and EOM are normal. Pupils are equal, round, and reactive to light.  Neck: Normal range of motion. Neck supple. No tracheal deviation present. No thyromegaly present.  Cardiovascular: Normal rate,  regular rhythm and normal heart sounds.  Pulmonary/Chest: No accessory muscle usage. No tachypnea. He has decreased breath sounds. He exhibits no tenderness.  Abdominal: Soft. Bowel sounds are normal. He exhibits no distension. There is no tenderness.  Musculoskeletal: Normal range of motion.  Neurological: He is alert and oriented to person, place, and time. No cranial nerve deficit.  Skin: Skin is warm and dry. No rash noted.  Psychiatric: Mood and affect normal.   LABORATORY PANEL:  Male CBC Recent Labs  Lab 12/26/17 0507  WBC 4.5  HGB 13.0  HCT 39.3*  PLT 176   ------------------------------------------------------------------------------------------------------------------ Chemistries  Recent Labs  Lab 12/25/17 2050 12/26/17 0507  NA 139 138  K 4.5 4.2  CL 93* 95*  CO2 36* 35*  GLUCOSE 164* 205*  BUN 19 20  CREATININE 0.70 0.68  CALCIUM 8.9 9.0  AST 22  --   ALT 29  --   ALKPHOS 66  --   BILITOT 0.7  --    RADIOLOGY:  No results found. ASSESSMENT AND PLAN:   1.  Acute hypoxemic respiratory failure: continue oxygen treatment -wean as tolerated, continue antibiotics, steroids and nebulizer treatments.  2.  Acute COPD exacerbation, treatment as above. 3.  Acute bronchitis, see treatment above under #1. 4.  Benign bilateral lung nodules, status post recent biopsy by Dr Mortimer Fries, it was non cancerous per pt 5.  Tobacco abuse.  Smoking cessation was discussed with patient in detail by admitting Dr 6.  Elevated troponin level, likely secondary demand ischemia       All the records are reviewed and case discussed with Care Management/Social Worker. Management plans discussed with the patient, nursing and they are in agreement.  CODE STATUS: Full Code  TOTAL TIME TAKING CARE OF THIS PATIENT: 35 minutes.   More than 50% of the time was spent in counseling/coordination of care: YES  POSSIBLE D/C IN 1 DAYS, DEPENDING ON CLINICAL CONDITION.   Max Sane M.D on  12/28/2017 at 3:08 PM  Between 7am to 6pm - Pager - [phone]  After 6pm go to [email or web address] - Proofreader  [facility]  Office  [phone]  CC: Primary care physician; [name], MD  Note: This dictation was prepared with Dragon dictation along with smaller phrase technology. Any transcriptional errors that result from this process are unintentional.

## 2017-12-29 LAB — BASIC METABOLIC PANEL
ANION GAP: 8 (ref 5–15)
BUN: 23 mg/dL — ABNORMAL HIGH (ref 6–20)
CO2: 36 mmol/L — ABNORMAL HIGH (ref 22–32)
Calcium: 9.2 mg/dL (ref 8.9–10.3)
Chloride: 96 mmol/L — ABNORMAL LOW (ref 101–111)
Creatinine, Ser: 0.65 mg/dL (ref 0.61–1.24)
GFR calc Af Amer: >60 mL/min (ref >60)
GLUCOSE: 118 mg/dL — AB (ref 65–99)
POTASSIUM: 4.4 mmol/L (ref 3.5–5.1)
SODIUM: 140 mmol/L (ref 135–145)

## 2017-12-29 LAB — GLUCOSE, CAPILLARY
GLUCOSE-CAPILLARY: 108 mg/dL — AB (ref 65–99)
Glucose-Capillary: 153 mg/dL — ABNORMAL HIGH (ref 65–99)
Glucose-Capillary: 103 mg/dL — ABNORMAL HIGH (ref 65–99)
Glucose-Capillary: 239 mg/dL — ABNORMAL HIGH (ref 65–99)

## 2017-12-29 LAB — CBC
HCT: 40.4 % (ref 40.0–52.0)
Hemoglobin: 13.1 g/dL (ref 13.0–18.0)
MCH: 29.5 pg (ref 26.0–34.0)
MCHC: 32.4 g/dL (ref 32.0–36.0)
MCV: 91.1 fL (ref 80.0–100.0)
PLATELETS: 243 10*3/uL (ref 150–440)
RBC: 4.44 MIL/uL (ref 4.40–5.90)
RDW: 13.1 % (ref 11.5–14.5)
WBC: 11 10*3/uL — AB (ref 3.8–10.6)

## 2017-12-29 MED ORDER — METHYLPREDNISOLONE SODIUM SUCC 125 MG IJ SOLR
80.0000 mg | Freq: Two times a day (BID) | INTRAMUSCULAR | Status: DC
  Administered 2017-12-29 – 2017-12-30 (×2): 80 mg via INTRAVENOUS
  Filled 2017-12-29 (×2): qty 2

## 2017-12-29 MED ORDER — AMOXICILLIN-POT CLAVULANATE 875-125 MG PO TABS
1.0000 | ORAL_TABLET | Freq: Two times a day (BID) | ORAL | Status: DC
  Administered 2017-12-29 – 2017-12-30 (×3): 1 via ORAL
  Filled 2017-12-29 (×3): qty 1

## 2017-12-29 NOTE — Plan of Care (Signed)
  Progressing Education: Knowledge of General Education information will improve 12/29/2017 0356 - Progressing by Denice Bors, RN Health Behavior/Discharge Planning: Ability to manage health-related needs will improve 12/29/2017 0356 - Progressing by Denice Bors, RN Clinical Measurements: Ability to maintain clinical measurements within normal limits will improve 12/29/2017 0356 - Progressing by Denice Bors, RN Will remain free from infection 12/29/2017 0356 - Progressing by Denice Bors, RN Diagnostic test results will improve 12/29/2017 0356 - Progressing by Denice Bors, RN Respiratory complications will improve 12/29/2017 0356 - Progressing by Denice Bors, RN Cardiovascular complication will be avoided 12/29/2017 0356 - Progressing by Denice Bors, RN Activity: Risk for activity intolerance will decrease 12/29/2017 0356 - Progressing by Denice Bors, RN Nutrition: Adequate nutrition will be maintained 12/29/2017 0356 - Progressing by Denice Bors, RN Coping: Level of anxiety will decrease 12/29/2017 0356 - Progressing by Denice Bors, RN Elimination: Will not experience complications related to bowel motility 12/29/2017 0356 - Progressing by Denice Bors, RN Will not experience complications related to urinary retention 12/29/2017 0356 - Progressing by Denice Bors, RN Pain Managment: General experience of comfort will improve 12/29/2017 0356 - Progressing by Denice Bors, RN Safety: Ability to remain free from injury will improve 12/29/2017 0356 - Progressing by Denice Bors, RN Skin Integrity: Risk for impaired skin integrity will decrease 12/29/2017 0356 - Progressing by Denice Bors, RN

## 2017-12-29 NOTE — Progress Notes (Signed)
[facility] at [place] NAME: Leslie Sims    MR#:  [id number]  DATE OF BIRTH:  [birth year]  SUBJECTIVE:  CHIEF COMPLAINT:   Chief Complaint  Patient presents with  . Respiratory Distress  improving, does not feel he can go home today REVIEW OF SYSTEMS:  Review of Systems  Constitutional: Positive for malaise/fatigue. Negative for chills, fever and weight loss.  HENT: Negative for nosebleeds and sore throat.   Eyes: Negative for blurred vision.  Respiratory: Positive for cough and shortness of breath. Negative for wheezing.   Cardiovascular: Negative for chest pain, orthopnea, leg swelling and PND.  Gastrointestinal: Negative for abdominal pain, constipation, diarrhea, heartburn, nausea and vomiting.  Genitourinary: Negative for dysuria and urgency.  Musculoskeletal: Negative for back pain.  Skin: Negative for rash.  Neurological: Positive for weakness. Negative for dizziness, speech change, focal weakness and headaches.  Endo/Heme/Allergies: Does not bruise/bleed easily.  Psychiatric/Behavioral: Negative for depression.   DRUG ALLERGIES:   Allergies  Allergen Reactions  . Effexor Xr [Venlafaxine Hcl Er]   . Wellbutrin [Bupropion] Other (See Comments)    Unknown/"made me feel real funny"   VITALS:  Blood pressure 135/72, pulse 72, temperature 98.3 F (36.8 C), resp. rate 18, height 5\' 10"  (1.778 m), weight 62.7 kg (138 lb 4.8 oz), SpO2 100 %. PHYSICAL EXAMINATION:  Physical Exam  Constitutional: He is oriented to person, place, and time. He appears malnourished. He appears unhealthy. He appears cachectic. He appears toxic. He has a sickly appearance.  HENT:  Head: Normocephalic and atraumatic.  Eyes: Conjunctivae and EOM are normal. Pupils are equal, round, and reactive to light.  Neck: Normal range of motion. Neck supple. No tracheal deviation present. No thyromegaly present.  Cardiovascular: Normal rate, regular rhythm and  normal heart sounds.  Pulmonary/Chest: No accessory muscle usage. No tachypnea. He has decreased breath sounds. He exhibits no tenderness.  Abdominal: Soft. Bowel sounds are normal. He exhibits no distension. There is no tenderness.  Musculoskeletal: Normal range of motion.  Neurological: He is alert and oriented to person, place, and time. No cranial nerve deficit.  Skin: Skin is warm and dry. No rash noted.  Psychiatric: Mood and affect normal.   LABORATORY PANEL:  Male CBC Recent Labs  Lab 12/29/17 0623  WBC 11.0*  HGB 13.1  HCT 40.4  PLT 243   ------------------------------------------------------------------------------------------------------------------ Chemistries  Recent Labs  Lab 12/25/17 2050  12/29/17 0623  NA 139   < > 140  K 4.5   < > 4.4  CL 93*   < > 96*  CO2 36*   < > 36*  GLUCOSE 164*   < > 118*  BUN 19   < > 23*  CREATININE 0.70   < > 0.65  CALCIUM 8.9   < > 9.2  AST 22  --   --   ALT 29  --   --   ALKPHOS 66  --   --   BILITOT 0.7  --   --    < > = values in this interval not displayed.   RADIOLOGY:  No results found. ASSESSMENT AND PLAN:   1.  Acute hypoxemic respiratory failure: continue oxygen treatment - wean as tolerated, taper steroids  - continue nebulizer treatments.  -Change intravenous antibiotics to oral Augmentin 2.  Acute COPD exacerbation, treatment as above. 3.  Acute bronchitis, see treatment above under #1. 4.  Benign bilateral lung nodules, status post recent biopsy by Dr  Kasa, it was non cancerous per pt 5.  Tobacco abuse.  Smoking cessation was discussed with patient in detail by admitting Dr 6.  Elevated troponin level, likely secondary demand ischemia   Will benefit from pulmonary rehab as an outpatient -Consult physical therapy, likely benefit from home health PT. he refused rehab    All the records are reviewed and case discussed with Care Management/Social Worker. Management plans discussed with the patient,  nursing and they are in agreement.  CODE STATUS: Full Code  TOTAL TIME TAKING CARE OF THIS PATIENT: 35 minutes.   More than 50% of the time was spent in counseling/coordination of care: YES  POSSIBLE D/C IN 1 DAYS, DEPENDING ON CLINICAL CONDITION.   Max Sane M.D on 12/29/2017 at 8:18 AM  Between 7am to 6pm - Pager - [phone]  After 6pm go to [email or web address] - Proofreader  [facility]  Office  [phone]  CC: Primary care physician; [name], MD  Note: This dictation was prepared with Dragon dictation along with smaller phrase technology. Any transcriptional errors that result from this process are unintentional.

## 2017-12-29 NOTE — Progress Notes (Signed)
Patient ambulated with physical therapy and pulse oximetry checked while walking.  Patients 02 SATs were 89-90% while ambulating on 2L nasal cannula and patient recovered to 92% at rest on 2L nasal cannula.

## 2017-12-29 NOTE — Evaluation (Signed)
Physical Therapy Evaluation Patient Details Name: Leslie Sims MRN: [id number] DOB: Oct 06, 1951 Today's Date: 12/29/2017   History of Present Illness  Pt is a78 y.o.malewith a known history of COPD on chronic 2 L oxygen at home, CHF and anxiety disorder.Patient is a heavy smoker for many years. Hewas brought to emergency room and acute respiratory distress.Per EMS,initially,oxygen saturation was in the low 70s on 3 L nasal cannula oxygen.In the emergency room he was placed on BiPAP some improvement,but continued to require BiPAP use. Patient has been having shortness of breath and productive cough for the past 7-10 days.He was seen in the emergency room 5 days ago for similar symptoms.He was discharged on steroids and nebulizer treatment that did not help.Chest x-ray shows acute bronchitis changes,no infiltrate.WBC is elevated at 17,000.Troponin level is mildly elevated at 0.08.  Assessment includes: acute hypoxemic respiratory failure, acute COPD exacerbation, acute bronchitis, and elevated troponin level likely secondary to demand ischemia.     Clinical Impression  Pt presents with deficits in general strength and activity tolerance.  Pt presented with good stability during amb without AD on 3LO2/min after amb a max of 200' pt did report mod SOB with the following vital sign readings: SpO2/HR at baseline 94%/88 bpm, after amb 60' 92%/104 bpm, after amb 200' 88%/104 bpm; Vital signs returned to near baseline level in 60 sec with cues for PLB.  Will continue to work with pt while in acute care for activity progression to address above deficits.  Upon discharge from acute care pt reports that is planning on attending pulmonary rehab.    Follow Up Recommendations Other (comment)(Pulmonary rehab)    Equipment Recommendations  Other (comment)    Recommendations for Other Services       Precautions / Restrictions Precautions Precautions: None Restrictions Weight  Bearing Restrictions: No      Mobility  Bed Mobility Overal bed mobility: Independent                Transfers Overall transfer level: Independent Equipment used: None             General transfer comment: Good eccentric and concentric control during transfers with good stability  Ambulation/Gait Ambulation/Gait assistance: Independent Ambulation Distance (Feet): 200 Feet Assistive device: None Gait Pattern/deviations: WFL(Within Functional Limits)   Gait velocity interpretation: Below normal speed for age/gender General Gait Details: Good stability during amb without AD on 3LO2/min; SpO2/HR at baseline 94%/88 bpm, after amb 60' 92%/104 bpm, after amb 200' 88%/104 bpm; Vital signs returned to near baseline level in 60 sec.  Stairs            Wheelchair Mobility    Modified Rankin (Stroke Patients Only)       Balance Overall balance assessment: Independent                                           Pertinent Vitals/Pain Pain Assessment: No/denies pain    Home Living Family/patient expects to be discharged to:: Private residence Living Arrangements: Alone Available Help at Discharge: Family;Friend(s);Available PRN/intermittently Type of Home: Mobile home Home Access: Stairs to enter Entrance Stairs-Rails: Right;Left;Can reach both Entrance Stairs-Number of Steps: 4 Home Layout: One level Home Equipment: Cane - single point;Walker - 4 wheels      Prior Function Level of Independence: Independent         Comments: Ind amb community distances without AD,  Ind with ADLs, no fall history      Hand Dominance        Extremity/Trunk Assessment   Upper Extremity Assessment Upper Extremity Assessment: Overall WFL for tasks assessed    Lower Extremity Assessment Lower Extremity Assessment: Overall WFL for tasks assessed       Communication   Communication: No difficulties  Cognition Arousal/Alertness:  Awake/alert Behavior During Therapy: WFL for tasks assessed/performed Overall Cognitive Status: Within Functional Limits for tasks assessed                                        General Comments General comments (skin integrity, edema, etc.): Pt steady with feet together and eyes closed    Exercises Total Joint Exercises Ankle Circles/Pumps: AROM;Both;10 reps Quad Sets: Strengthening;Both;10 reps Gluteal Sets: Strengthening;Both;10 reps Hip ABduction/ADduction: AROM;Both;10 reps Straight Leg Raises: AROM;Both;10 reps Long Arc Quad: AROM;Both;10 reps Knee Flexion: AROM;Both;10 reps Marching in Standing: AROM;Both;10 reps   Assessment/Plan    PT Assessment Patient needs continued PT services  PT Problem List Decreased activity tolerance       PT Treatment Interventions Gait training;Stair training;Therapeutic activities;Therapeutic exercise    PT Goals (Current goals can be found in the Care Plan section)  Acute Rehab PT Goals Patient Stated Goal: Improved endurance PT Goal Formulation: With patient Time For Goal Achievement: 01/11/18 Potential to Achieve Goals: Good    Frequency Min 2X/week   Barriers to discharge        Co-evaluation               AM-PAC PT "6 Clicks" Daily Activity  Outcome Measure Difficulty turning over in bed (including adjusting bedclothes, sheets and blankets)?: None Difficulty moving from lying on back to sitting on the side of the bed? : None Difficulty sitting down on and standing up from a chair with arms (e.g., wheelchair, bedside commode, etc,.)?: None Help needed moving to and from a bed to chair (including a wheelchair)?: None Help needed walking in hospital room?: None Help needed climbing 3-5 steps with a railing? : A Little 6 Click Score: 23    End of Session Equipment Utilized During Treatment: Gait belt Activity Tolerance: Patient tolerated treatment well Patient left: in bed;with call bell/phone  within reach Nurse Communication: Mobility status;Other (comment)(SpO2 with amb) PT Visit Diagnosis: Difficulty in walking, not elsewhere classified (R26.2)    Time: 1478-2956 PT Time Calculation (min) (ACUTE ONLY): 28 min   Charges:   PT Evaluation $PT Eval Low Complexity: 1 Low PT Treatments $Therapeutic Exercise: 8-22 mins   PT G Codes:        DRoyetta Asal PT, DPT 12/29/17, 1:47 PM

## 2017-12-30 LAB — CULTURE, BLOOD (ROUTINE X 2)
CULTURE: NO GROWTH
Culture: NO GROWTH
SPECIAL REQUESTS: ADEQUATE

## 2017-12-30 LAB — GLUCOSE, CAPILLARY
GLUCOSE-CAPILLARY: 102 mg/dL — AB (ref 65–99)
GLUCOSE-CAPILLARY: 119 mg/dL — AB (ref 65–99)
Glucose-Capillary: 80 mg/dL (ref 65–99)

## 2017-12-30 MED ORDER — AMOXICILLIN-POT CLAVULANATE 875-125 MG PO TABS: 1.0000 | ORAL_TABLET | Freq: Two times a day (BID) | ORAL | 0 refills | Status: DC

## 2017-12-30 MED ORDER — PREDNISONE 10 MG (21) PO TBPK: ORAL_TABLET | ORAL | 0 refills | Status: DC

## 2017-12-30 MED ORDER — SENNOSIDES-DOCUSATE SODIUM 8.6-50 MG PO TABS
2.0000 | ORAL_TABLET | ORAL | Status: AC
  Administered 2017-12-30: 15:00:00 2 via ORAL
  Filled 2017-12-30: qty 2

## 2017-12-30 MED ORDER — FUROSEMIDE 20 MG PO TABS: 20.0000 mg | ORAL_TABLET | Freq: Every day | ORAL | 0 refills | Status: DC

## 2017-12-30 NOTE — Progress Notes (Signed)
Taken off O2, at rest sats 88%. Pt taken for walk without oxygen, patient walked 30 feet sats dropped to 84% on roomair. Patient brought back to room and placed back on O2 at 2lpm [place], sats came up to 95%

## 2017-12-30 NOTE — Discharge Instructions (Signed)

## 2017-12-30 NOTE — Care Management (Signed)
Will need continuous oxygen 2 liters per nasal cannula. Order for continuous oxygen and qualifing saturations faxed to [facility]. Discharge to home today per Dr. Manuella Ghazi. Family will transport Shelbie Ammons RN MSN CCM Care management [phone]

## 2017-12-30 NOTE — Progress Notes (Signed)
Patient discharged with daughter, discharge instructions and prescriptions reviewed with patient, states understanding. IV removed, catheter intact. Patient with no complaints.

## 2017-12-31 LAB — BLOOD GAS, VENOUS
Acid-Base Excess: 10.5 mmol/L — ABNORMAL HIGH (ref 0.0–2.0)
Bicarbonate: 37.2 mmol/L — ABNORMAL HIGH (ref 20.0–28.0)
O2 Saturation: 87.4 %
PCO2 VEN: 56 mmHg (ref 44.0–60.0)
PH VEN: 7.43 (ref 7.250–7.430)
PO2 VEN: 52 mmHg — AB (ref 32.0–45.0)
Patient temperature: 37

## 2018-01-02 NOTE — Discharge Summary (Signed)
[facility] NAME: Leslie Sims    MR#:  [id number]  DATE OF BIRTH:  Feb 13, 1957  DATE OF ADMISSION:  12/25/2017   ADMITTING PHYSICIAN: Amelia Jo, MD  DATE OF DISCHARGE: 12/30/2017  6:00 PM  PRIMARY CARE PHYSICIAN: [name], MD   ADMISSION DIAGNOSIS:  Acute on chronic respiratory failure with hypoxia ([place]) [J96.21] DISCHARGE DIAGNOSIS:  Active Problems:   COPD with hypoxia ([place])  SECONDARY DIAGNOSIS:   Past Medical History:  Diagnosis Date  . Allergic rhinitis   . Anxiety   . Atherosclerosis   . Back pain   . Bronchitis    09-14-11  . CARDIOMYOPATHY 03/24/2010   did not tolerate Toprol and Lisinopril.   . Chest pain   . CHF (congestive heart failure) ([place])   . Chronic airway obstruction, not elsewhere classified   . Colon polyps 08/2012   colonoscopy; multiple colon polyps; repeat colonoscopy in one year.  [name] COPD (chronic obstructive pulmonary disease) ([place])   . Coronary artery disease 11/2009   Anterior MI with late presentation 11/2009. Cath showed a 100% proximal LAD occlusion, 99% mid RCA. Had a PCI and 2 DES to both LAD and RCA. EF was 35%. Nuclear stress test 05/13: prior anterior/inferior infarcts without ischemic, EF 43%, cardiac cath in 02/14: Widely patent stents with no other obstructive disease. EF 40%   . Depression   . Diabetes mellitus without complication ([place])   . GERD (gastroesophageal reflux disease)   . Headache(784.0)   . Helicobacter pylori (H. pylori)   . Hemorrhoids   . Hernia    inguinal  . Hyperlipidemia   . Hypertension   . Insomnia   . MI (myocardial infarction) ([place])   . Myalgia   . Peptic ulcer   . Shortness of breath dyspnea   . Status post dilation of esophageal narrowing 2000  . Thyroid dysfunction   . Tinea pedis   . Wears dentures    partial upper   HOSPITAL COURSE:  1.Acute hypoxemic respiratory failure: improving with steroids and nebs 2.Acute COPD  exacerbation 3.Acute bronchitis 4.Benign bilateral lung nodules,status post recent biopsy by Dr Mortimer Fries, it was non cancerous per pt 5.Tobacco abuse.Smoking cessation was discussed with patient in detail by admitting Dr 6.Elevated troponin level,likely secondary demand ischemia DISCHARGE CONDITIONS:  stable CONSULTS OBTAINED:   DRUG ALLERGIES:   Allergies  Allergen Reactions  . Effexor Xr [Venlafaxine Hcl Er]   . Wellbutrin [Bupropion] Other (See Comments)    Unknown/"made me feel real funny"   DISCHARGE MEDICATIONS:   Allergies as of 12/30/2017      Reactions   Effexor Xr [venlafaxine Hcl Er]    Wellbutrin [bupropion] Other (See Comments)   Unknown/"made me feel real funny"      Medication List    STOP taking these medications   methylPREDNISolone 4 MG Tbpk tablet Commonly known as:  MEDROL DOSEPAK   predniSONE 20 MG tablet Commonly known as:  DELTASONE Replaced by:  predniSONE 10 MG (21) Tbpk tablet   sodium chloride 0.65 % Soln nasal spray Commonly known as:  OCEAN     TAKE these medications   albuterol (2.5 MG/3ML) 0.083% nebulizer solution Commonly known as:  PROVENTIL USE 1 VIAL (3 ML) BY NEBULIZATION EVERY 6 HOURS AS NEEDED FOR WHEEZING OR SHORTNESS OF BREATH   albuterol 108 (90 Base) MCG/ACT inhaler Commonly known as:  VENTOLIN HFA USE 2 INHALATIONS EVERY 6 HOURS AS NEEDED FOR WHEEZING  amoxicillin-clavulanate 875-125 MG tablet Commonly known as:  AUGMENTIN Take 1 tablet by mouth every 12 (twelve) hours.   aspirin 81 MG tablet Take 81 mg by mouth daily.   atorvastatin 40 MG tablet Commonly known as:  LIPITOR Take 1 tablet (40 mg total) by mouth daily.   azelastine 0.1 % nasal spray Commonly known as:  ASTELIN Place 2 sprays into both nostrils 2 (two) times daily. Use in each nostril as directed   fluticasone 50 MCG/ACT nasal spray Commonly known as:  FLONASE Place 1 spray into both nostrils 2 (two) times daily.     Fluticasone-Salmeterol 500-50 MCG/DOSE Aepb Commonly known as:  ADVAIR DISKUS Inhale 1 puff into the lungs 2 (two) times daily.   furosemide 20 MG tablet Commonly known as:  LASIX Take 1 tablet (20 mg total) by mouth daily.   ipratropium-albuterol 0.5-2.5 (3) MG/3ML Soln Commonly known as:  DUONEB INHALE 1 VIAL BY NEBULIZER EVERY 6 HOURS AS NEEDED   ketoconazole 2 % cream Commonly known as:  NIZORAL Apply 1 application topically 2 (two) times daily.   LORazepam 1 MG tablet Commonly known as:  ATIVAN Take 1 tablet (1 mg total) by mouth 2 (two) times daily. What changed:    how much to take  when to take this  reasons to take this   losartan 25 MG tablet Commonly known as:  COZAAR Take 1 tablet (25 mg total) by mouth daily.   mirtazapine 15 MG tablet Commonly known as:  REMERON Take 1 tablet (15 mg total) by mouth at bedtime.   multivitamin capsule Take 1 capsule by mouth daily.   nitroGLYCERIN 0.4 MG SL tablet Commonly known as:  NITROSTAT Place 1 tablet (0.4 mg total) under the tongue every 5 (five) minutes as needed.   predniSONE 10 MG (21) Tbpk tablet Commonly known as:  STERAPRED UNI-PAK 21 TAB Start 60 mg po daily, taper 10 mg once daily until done Replaces:  predniSONE 20 MG tablet   sertraline 100 MG tablet Commonly known as:  ZOLOFT Take 1 tablet (100 mg total) by mouth at bedtime.   sucralfate 1 GM/10ML suspension Commonly known as:  CARAFATE Take 10 mLs (1 g total) by mouth 4 (four) times daily -  with meals and at bedtime.   tiotropium 18 MCG inhalation capsule Commonly known as:  SPIRIVA HANDIHALER Place 1 capsule (18 mcg total) into inhaler and inhale daily.   XOPENEX 1.25 MG/3ML nebulizer solution Generic drug:  levalbuterol Inhale into the lungs.        DISCHARGE INSTRUCTIONS:   DIET:  Regular diet DISCHARGE CONDITION:  Good ACTIVITY:  Activity as tolerated OXYGEN:  Home Oxygen: Yes.    Oxygen Delivery: 2 liters/min via  Patient connected to nasal cannula oxygen DISCHARGE LOCATION:  home   If you experience worsening of your admission symptoms, develop shortness of breath, life threatening emergency, suicidal or homicidal thoughts you must seek medical attention immediately by calling 911 or calling your MD immediately  if symptoms less severe.  You Must read complete instructions/literature along with all the possible adverse reactions/side effects for all the Medicines you take and that have been prescribed to you. Take any new Medicines after you have completely understood and accpet all the possible adverse reactions/side effects.   Please note  You were cared for by a hospitalist during your hospital stay. If you have any questions about your discharge medications or the care you received while you were in the hospital after you are  discharged, you can call the unit and asked to speak with the hospitalist on call if the hospitalist that took care of you is not available. Once you are discharged, your primary care physician will handle any further medical issues. Please note that NO REFILLS for any discharge medications will be authorized once you are discharged, as it is imperative that you return to your primary care physician (or establish a relationship with a primary care physician if you do not have one) for your aftercare needs so that they can reassess your need for medications and monitor your lab values.    On the day of Discharge:  VITAL SIGNS:  Blood pressure (!) 151/85, pulse 74, temperature (!) 97.5 F (36.4 C), temperature source Oral, resp. rate 16, height 5\' 10"  (1.778 m), weight 62 kg (136 lb 9.6 oz), SpO2 98 %. PHYSICAL EXAMINATION:  GENERAL:  67 y.o.-year-old patient lying in the bed with no acute distress.  EYES: Pupils equal, round, reactive to light and accommodation. No scleral icterus. Extraocular muscles intact.  HEENT: Head atraumatic, normocephalic. Oropharynx and nasopharynx  clear.  NECK:  Supple, no jugular venous distention. No thyroid enlargement, no tenderness.  LUNGS: Normal breath sounds bilaterally, no wheezing, rales,rhonchi or crepitation. No use of accessory muscles of respiration.  CARDIOVASCULAR: S1, S2 normal. No murmurs, rubs, or gallops.  ABDOMEN: Soft, non-tender, non-distended. Bowel sounds present. No organomegaly or mass.  EXTREMITIES: No pedal edema, cyanosis, or clubbing.  NEUROLOGIC: Cranial nerves II through XII are intact. Muscle strength 5/5 in all extremities. Sensation intact. Gait not checked.  PSYCHIATRIC: The patient is alert and oriented x 3.  SKIN: No obvious rash, lesion, or ulcer.  DATA REVIEW:   CBC Recent Labs  Lab 12/29/17 0623  WBC 11.0*  HGB 13.1  HCT 40.4  PLT 243    Chemistries  Recent Labs  Lab 12/29/17 0623  NA 140  K 4.4  CL 96*  CO2 36*  GLUCOSE 118*  BUN 23*  CREATININE 0.65  CALCIUM 9.2     Follow-up Information    [name], MD. Go on 01/09/2018.   Specialty:  Family Medicine Why:  at 5:20 p.m.-Hospital follow up Contact information: [place] [address] [phone]        Erby Pian, MD. Go on 01/18/2018.   Specialty:  Specialist Why:  at 9:15 a.m.  Contact information: [address] [facility] [place]  [address] [phone]           Management plans discussed with the patient, family and they are in agreement.  CODE STATUS: Prior   TOTAL TIME TAKING CARE OF THIS PATIENT: 45 minutes.    Max Sane M.D on 01/02/2018 at 2:48 PM  Between 7am to 6pm - Pager - [phone]  After 6pm go to [email or web address] - Proofreader  [facility]  Office  [phone]  CC: Primary care physician; [name], MD   Note: This dictation was prepared with Dragon dictation along with smaller phrase technology. Any transcriptional errors that result from this process are  unintentional.

## 2018-01-04 ENCOUNTER — Other Ambulatory Visit: Payer: Self-pay | Admitting: Family Medicine

## 2018-01-04 NOTE — Telephone Encounter (Signed)
Controlled substance 

## 2018-01-09 ENCOUNTER — Encounter: Payer: Self-pay | Admitting: Family Medicine

## 2018-01-09 ENCOUNTER — Ambulatory Visit (INDEPENDENT_AMBULATORY_CARE_PROVIDER_SITE_OTHER): Payer: BLUE CROSS/BLUE SHIELD | Admitting: Family Medicine

## 2018-01-09 VITALS — BP 122/82 | HR 116 | Temp 98.0°F | Resp 16 | Ht 69.29 in | Wt 140.0 lb

## 2018-01-09 DIAGNOSIS — F341 Dysthymic disorder: Secondary | ICD-10-CM

## 2018-01-09 DIAGNOSIS — J441 Chronic obstructive pulmonary disease with (acute) exacerbation: Secondary | ICD-10-CM | POA: Diagnosis not present

## 2018-01-09 DIAGNOSIS — F17200 Nicotine dependence, unspecified, uncomplicated: Secondary | ICD-10-CM | POA: Diagnosis not present

## 2018-01-09 DIAGNOSIS — J449 Chronic obstructive pulmonary disease, unspecified: Secondary | ICD-10-CM

## 2018-01-09 DIAGNOSIS — F4321 Adjustment disorder with depressed mood: Secondary | ICD-10-CM

## 2018-01-09 MED ORDER — ALBUTEROL SULFATE (2.5 MG/3ML) 0.083% IN NEBU
2.5000 mg | INHALATION_SOLUTION | Freq: Once | RESPIRATORY_TRACT | Status: AC
  Administered 2018-01-09: 2.5 mg via RESPIRATORY_TRACT

## 2018-01-09 MED ORDER — IPRATROPIUM BROMIDE 0.02 % IN SOLN
0.5000 mg | Freq: Once | RESPIRATORY_TRACT | Status: AC
  Administered 2018-01-09: 0.5 mg via RESPIRATORY_TRACT

## 2018-01-09 MED ORDER — IPRATROPIUM-ALBUTEROL 0.5-2.5 (3) MG/3ML IN SOLN: 3.0000 mL | Freq: Four times a day (QID) | RESPIRATORY_TRACT | 3 refills | Status: DC | PRN

## 2018-01-09 NOTE — Progress Notes (Signed)
Subjective:    Patient ID: Leslie Sims, male    DOB: [birth year], 67 y.o.   MRN: [id number]  01/09/2018  Hospitalization Follow-up (pt was in the ER on 12/25/2017 for COPD/hypoxia pt states he feels better. ) and Depression (pt states his depression has been bad for the last few weeks )    HPI This 67 y.o. male presents for evaluation of hospital follow-up for COPD exacerbation and anxiety.   Has not been notified regarding pulmonary rehab. First grief session last Monday behind [employer].  Smoking cessation course; no one signed up; will meet with pt.   Leslie Sims taking care of horses.   Not going to church.   Has not seen best friend.   Brother invited pateint to ride to [place]. No SI; sleeping pretty well. Lorazepam 1.5 daily; taking middle of day one whole one; then took 1/2 after lunch.    Chest pain eased off a bit with abx therapy and steroids.  Details of discharge summary are as follows: DATE OF ADMISSION:  12/25/2017       ADMITTING PHYSICIAN: Amelia Jo, MD DATE OF DISCHARGE: 12/30/2017  6:00 PM PRIMARY CARE PHYSICIAN: [name], MD   ADMISSION DIAGNOSIS:  Acute on chronic respiratory failure with hypoxia ([place]) [J96.21] DISCHARGE DIAGNOSIS:  Active Problems:   COPD with hypoxia ([place])  SECONDARY DIAGNOSIS:       Past Medical History:  Diagnosis Date  . Allergic rhinitis   . Anxiety   . Atherosclerosis   . Back pain   . Bronchitis    09-14-11  . CARDIOMYOPATHY 03/24/2010   did not tolerate Toprol and Lisinopril.   . Chest pain   . CHF (congestive heart failure) ([place])   . Chronic airway obstruction, not elsewhere classified   . Colon polyps 08/2012   colonoscopy; multiple colon polyps; repeat colonoscopy in one year.  [name] COPD (chronic obstructive pulmonary disease) ([place])   . Coronary artery disease 11/2009   Anterior MI with late presentation 11/2009. Cath showed a 100% proximal LAD occlusion, 99% mid RCA. Had a PCI and 2  DES to both LAD and RCA. EF was 35%. Nuclear stress test 05/13: prior anterior/inferior infarcts without ischemic, EF 43%, cardiac cath in 02/14: Widely patent stents with no other obstructive disease. EF 40%   . Depression   . Diabetes mellitus without complication ([place])   . GERD (gastroesophageal reflux disease)   . Headache(784.0)   . Helicobacter pylori (H. pylori)   . Hemorrhoids   . Hernia    inguinal  . Hyperlipidemia   . Hypertension   . Insomnia   . MI (myocardial infarction) ([place])   . Myalgia   . Peptic ulcer   . Shortness of breath dyspnea   . Status post dilation of esophageal narrowing 2000  . Thyroid dysfunction   . Tinea pedis   . Wears dentures    partial upper   HOSPITAL COURSE:  1.Acute hypoxemic respiratory failure: improving with steroids and nebs 2.Acute COPD exacerbation 3.Acute bronchitis 4.Benign bilateral lung nodules,status post recent biopsy by Dr Mortimer Fries, it was non cancerous per pt 5.Tobacco abuse.Smoking cessation was discussed with patient in detail by admitting Dr 6.Elevated troponin level,likely secondary demand ischemia DISCHARGE CONDITIONS:  stable CONSULTS OBTAINED:  DRUG ALLERGIES:        Allergies  Allergen Reactions  . Effexor Xr [Venlafaxine Hcl Er]   . Wellbutrin [Bupropion] Other (See Comments)    Unknown/"made me feel real funny"   DISCHARGE  MEDICATIONS:       Allergies as of 12/30/2017      Reactions   Effexor Xr [venlafaxine Hcl Er]    Wellbutrin [bupropion] Other (See Comments)   Unknown/"made me feel real funny"          Medication List     STOP taking these medications   methylPREDNISolone 4 MG Tbpk tablet Commonly known as:  MEDROL DOSEPAK   predniSONE 20 MG tablet Commonly known as:  DELTASONE Replaced by:  predniSONE 10 MG (21) Tbpk tablet   sodium chloride 0.65 % Soln nasal spray Commonly known as:  OCEAN     TAKE these medications   albuterol (2.5  MG/3ML) 0.083% nebulizer solution Commonly known as:  PROVENTIL USE 1 VIAL (3 ML) BY NEBULIZATION EVERY 6 HOURS AS NEEDED FOR WHEEZING OR SHORTNESS OF BREATH   albuterol 108 (90 Base) MCG/ACT inhaler Commonly known as:  VENTOLIN HFA USE 2 INHALATIONS EVERY 6 HOURS AS NEEDED FOR WHEEZING   amoxicillin-clavulanate 875-125 MG tablet Commonly known as:  AUGMENTIN Take 1 tablet by mouth every 12 (twelve) hours.   aspirin 81 MG tablet Take 81 mg by mouth daily.   atorvastatin 40 MG tablet Commonly known as:  LIPITOR Take 1 tablet (40 mg total) by mouth daily.   azelastine 0.1 % nasal spray Commonly known as:  ASTELIN Place 2 sprays into both nostrils 2 (two) times daily. Use in each nostril as directed   fluticasone 50 MCG/ACT nasal spray Commonly known as:  FLONASE Place 1 spray into both nostrils 2 (two) times daily.   Fluticasone-Salmeterol 500-50 MCG/DOSE Aepb Commonly known as:  ADVAIR DISKUS Inhale 1 puff into the lungs 2 (two) times daily.   furosemide 20 MG tablet Commonly known as:  LASIX Take 1 tablet (20 mg total) by mouth daily.   ipratropium-albuterol 0.5-2.5 (3) MG/3ML Soln Commonly known as:  DUONEB INHALE 1 VIAL BY NEBULIZER EVERY 6 HOURS AS NEEDED   ketoconazole 2 % cream Commonly known as:  NIZORAL Apply 1 application topically 2 (two) times daily.   LORazepam 1 MG tablet Commonly known as:  ATIVAN Take 1 tablet (1 mg total) by mouth 2 (two) times daily. What changed:    how much to take  when to take this  reasons to take this   losartan 25 MG tablet Commonly known as:  COZAAR Take 1 tablet (25 mg total) by mouth daily.   mirtazapine 15 MG tablet Commonly known as:  REMERON Take 1 tablet (15 mg total) by mouth at bedtime.   multivitamin capsule Take 1 capsule by mouth daily.   nitroGLYCERIN 0.4 MG SL tablet Commonly known as:  NITROSTAT Place 1 tablet (0.4 mg total) under the tongue every 5 (five) minutes as needed.     predniSONE 10 MG (21) Tbpk tablet Commonly known as:  STERAPRED UNI-PAK 21 TAB Start 60 mg po daily, taper 10 mg once daily until done Replaces:  predniSONE 20 MG tablet   sertraline 100 MG tablet Commonly known as:  ZOLOFT Take 1 tablet (100 mg total) by mouth at bedtime.   sucralfate 1 GM/10ML suspension Commonly known as:  CARAFATE Take 10 mLs (1 g total) by mouth 4 (four) times daily -  with meals and at bedtime.   tiotropium 18 MCG inhalation capsule Commonly known as:  SPIRIVA HANDIHALER Place 1 capsule (18 mcg total) into inhaler and inhale daily.   XOPENEX 1.25 MG/3ML nebulizer solution Generic drug:  levalbuterol Inhale into the lungs.  DISCHARGE INSTRUCTIONS:   DIET:  Regular diet DISCHARGE CONDITION:  Good ACTIVITY:  Activity as tolerated OXYGEN:  Home Oxygen: Yes.    Oxygen Delivery: 2 liters/min via Patient connected to nasal cannula oxygen DISCHARGE LOCATION:  home    BP Readings from Last 3 Encounters:  01/09/18 122/82  12/30/17 (!) 151/85  12/23/17 103/80   Wt Readings from Last 3 Encounters:  01/09/18 140 lb (63.5 kg)  12/30/17 136 lb 9.6 oz (62 kg)  12/23/17 147 lb (66.7 kg)   Immunization History  Administered Date(s) Administered  . Influenza Whole 09/19/2012  . Influenza,inj,Quad PF,6+ Mos 10/24/2013, 11/25/2014, 03/02/2016, 10/27/2016, 10/05/2017  . Pneumococcal Conjugate-13 11/25/2014  . Pneumococcal Polysaccharide-23 08/24/2016, 10/27/2016  . Pneumococcal-Unspecified 12/20/2008  . Tdap 12/20/2008    Review of Systems  Constitutional: Negative for activity change, appetite change, chills, diaphoresis, fatigue, fever and unexpected weight change.  HENT: Negative for congestion, dental problem, drooling, ear discharge, ear pain, facial swelling, hearing loss, mouth sores, nosebleeds, postnasal drip, rhinorrhea, sinus pressure, sneezing, sore throat, tinnitus, trouble swallowing and voice change.   Eyes: Negative for  photophobia, pain, discharge, redness, itching and visual disturbance.  Respiratory: Positive for shortness of breath and wheezing. Negative for apnea, cough, choking, chest tightness and stridor.   Cardiovascular: Negative for chest pain, palpitations and leg swelling.  Gastrointestinal: Negative for abdominal pain, blood in stool, constipation, diarrhea, nausea and vomiting.  Endocrine: Negative for cold intolerance, heat intolerance, polydipsia, polyphagia and polyuria.  Genitourinary: Negative for decreased urine volume, difficulty urinating, discharge, dysuria, enuresis, flank pain, frequency, genital sores, hematuria, penile pain, penile swelling, scrotal swelling, testicular pain and urgency.  Musculoskeletal: Negative for arthralgias, back pain, gait problem, joint swelling, myalgias, neck pain and neck stiffness.  Skin: Negative for color change, pallor, rash and wound.  Allergic/Immunologic: Negative for environmental allergies, food allergies and immunocompromised state.  Neurological: Negative for dizziness, tremors, seizures, syncope, facial asymmetry, speech difficulty, weakness, light-headedness, numbness and headaches.  Hematological: Negative for adenopathy. Does not bruise/bleed easily.  Psychiatric/Behavioral: Positive for dysphoric mood. Negative for agitation, behavioral problems, confusion, decreased concentration, hallucinations, self-injury, sleep disturbance and suicidal ideas. The patient is nervous/anxious. The patient is not hyperactive.     Past Medical History:  Diagnosis Date  . Allergic rhinitis   . Anxiety   . Atherosclerosis   . Back pain   . Bronchitis    09-14-11  . CARDIOMYOPATHY 03/24/2010   did not tolerate Toprol and Lisinopril.   . Chest pain   . CHF (congestive heart failure) ([place])   . Chronic airway obstruction, not elsewhere classified   . Colon polyps 08/2012   colonoscopy; multiple colon polyps; repeat colonoscopy in one year.  [name] COPD (chronic  obstructive pulmonary disease) ([place])   . Coronary artery disease 11/2009   Anterior MI with late presentation 11/2009. Cath showed a 100% proximal LAD occlusion, 99% mid RCA. Had a PCI and 2 DES to both LAD and RCA. EF was 35%. Nuclear stress test 05/13: prior anterior/inferior infarcts without ischemic, EF 43%, cardiac cath in 02/14: Widely patent stents with no other obstructive disease. EF 40%   . Depression   . Diabetes mellitus without complication ([place])   . GERD (gastroesophageal reflux disease)   . Headache(784.0)   . Helicobacter pylori (H. pylori)   . Hemorrhoids   . Hernia    inguinal  . Hyperlipidemia   . Hypertension   . Insomnia   . MI (myocardial infarction) ([place])   . Myalgia   .  Peptic ulcer   . Shortness of breath dyspnea   . Status post dilation of esophageal narrowing 2000  . Thyroid dysfunction   . Tinea pedis   . Wears dentures    partial upper   Past Surgical History:  Procedure Laterality Date  . Admission  12/20/2012   COPD exacerbation.  [facility].  [name] CARDIAC CATHETERIZATION    . CARDIAC CATHETERIZATION  02/05/13   [facility]  . CARDIAC CATHETERIZATION  10/14   [facility] : patent stents with no change in anatomy. EF: 40$  . CARDIAC CATHETERIZATION Left 11/12/2016   Procedure: Left Heart Cath and Coronary Angiography;  Surgeon: Wellington Hampshire, MD;  Location: [facility];  Service: Cardiovascular;  Laterality: Left;  . COLONOSCOPY    . COLONOSCOPY WITH PROPOFOL N/A 05/18/2016   Procedure: COLONOSCOPY WITH PROPOFOL;  Surgeon: Lucilla Lame, MD;  Location: [facility] ENDOSCOPY;  Service: Endoscopy;  Laterality: N/A;  . CORONARY ANGIOPLASTY WITH STENT PLACEMENT  09/2009   LAD 3.0 X23 mm Xience DES, RCA: 4.0 X 15 mm Xience DES  . ELECTROMAGNETIC NAVIGATION BROCHOSCOPY N/A 10/27/2017   Procedure: ELECTROMAGNETIC NAVIGATION BRONCHOSCOPY;  Surgeon: Flora Lipps, MD;  Location: [facility] ORS;  Service: Cardiopulmonary;  Laterality: N/A;  . ESOPHAGEAL DILATION    .  ESOPHAGOGASTRODUODENOSCOPY  2008  . ESOPHAGOGASTRODUODENOSCOPY  08/20/2012  . HERNIA REPAIR  07/20/2012   L inguinal hernia repair  . PENILE PROSTHESIS IMPLANT     Allergies  Allergen Reactions  . Effexor Xr [Venlafaxine Hcl Er]   . Wellbutrin [Bupropion] Other (See Comments)    Unknown/"made me feel real funny"   Current Outpatient Medications on File Prior to Visit  Medication Sig Dispense Refill  . albuterol (PROVENTIL) (2.5 MG/3ML) 0.083% nebulizer solution USE 1 VIAL (3 ML) BY NEBULIZATION EVERY 6 HOURS AS NEEDED FOR WHEEZING OR SHORTNESS OF BREATH (Patient not taking: Reported on 12/25/2017) 1080 mL 3  . albuterol (VENTOLIN HFA) 108 (90 Base) MCG/ACT inhaler USE 2 INHALATIONS EVERY 6 HOURS AS NEEDED FOR WHEEZING 48 g 3  . amoxicillin-clavulanate (AUGMENTIN) 875-125 MG tablet Take 1 tablet by mouth every 12 (twelve) hours. 4 tablet 0  . aspirin 81 MG tablet Take 81 mg by mouth daily.      [name] atorvastatin (LIPITOR) 40 MG tablet Take 1 tablet (40 mg total) by mouth daily. 90 tablet 3  . azelastine (ASTELIN) 0.1 % nasal spray Place 2 sprays into both nostrils 2 (two) times daily. Use in each nostril as directed (Patient not taking: Reported on 12/25/2017) 30 mL 12  . fluticasone (FLONASE) 50 MCG/ACT nasal spray Place 1 spray into both nostrils 2 (two) times daily. (Patient not taking: Reported on 12/25/2017) 48 g 3  . Fluticasone-Salmeterol (ADVAIR DISKUS) 500-50 MCG/DOSE AEPB Inhale 1 puff into the lungs 2 (two) times daily. 180 each 3  . furosemide (LASIX) 20 MG tablet Take 1 tablet (20 mg total) by mouth daily. 30 tablet 0  . ketoconazole (NIZORAL) 2 % cream Apply 1 application topically 2 (two) times daily. 60 g 0  . levalbuterol (XOPENEX) 1.25 MG/3ML nebulizer solution Inhale into the lungs.    [name] LORazepam (ATIVAN) 1 MG tablet TAKE 1/2 TABLET BY MOUTH EVERY 6 HOURS AS NEEDED FOR ANXIETY 60 tablet 1  . losartan (COZAAR) 25 MG tablet Take 1 tablet (25 mg total) by mouth daily. 90 tablet 3  .  mirtazapine (REMERON) 15 MG tablet Take 1 tablet (15 mg total) by mouth at bedtime. 30 tablet 5  . Multiple  Vitamin (MULTIVITAMIN) capsule Take 1 capsule by mouth daily.    . nitroGLYCERIN (NITROSTAT) 0.4 MG SL tablet Place 1 tablet (0.4 mg total) under the tongue every 5 (five) minutes as needed. 25 tablet 3  . predniSONE (STERAPRED UNI-PAK 21 TAB) 10 MG (21) TBPK tablet Start 60 mg po daily, taper 10 mg once daily until done 21 tablet 0  . sertraline (ZOLOFT) 100 MG tablet Take 1 tablet (100 mg total) by mouth at bedtime. 90 tablet 1  . sucralfate (CARAFATE) 1 GM/10ML suspension Take 10 mLs (1 g total) by mouth 4 (four) times daily -  with meals and at bedtime. 420 mL 4  . tiotropium (SPIRIVA HANDIHALER) 18 MCG inhalation capsule Place 1 capsule (18 mcg total) into inhaler and inhale daily. 90 capsule 3   No current facility-administered medications on file prior to visit.    Social History   Socioeconomic History  . Marital status: Widowed    Spouse name: Not on file  . Number of children: 5  . Years of education: Not on file  . Highest education level: Not on file  Social Needs  . Financial resource strain: Not on file  . Food insecurity - worry: Not on file  . Food insecurity - inability: Not on file  . Transportation needs - medical: Not on file  . Transportation needs - non-medical: Not on file  Occupational History  . 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 Smoking status: Current Every Day Smoker    Packs/day: 1.50    Years: 42.00    Pack years: 63.00    Types: Cigarettes  . Smokeless tobacco: Never Used  . Tobacco comment: smoking 15-18 cigarettes daily  Substance and Sexual Activity  . Alcohol use: No    Alcohol/week: 0.0 oz  . Drug use: No  . Sexual activity: Yes  Other Topics Concern  . Not on file  Social History Narrative   Marital status: married x 32 years; happily married.      Children: 4 children;  1 stepchild  and 15 grandchildren; no great grandchildren.      Lives: with wife; 2 dogs, 1 cat.      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      Tobacco: 1 ppd x 45 years      Alcohol: none; previous alcoholism      Drugs: none      Exercise: no formal exercise; physically demanding job; owns horses.      Seatbelts:  100%      Guns: one loaded unsecured gun in the home.      Sunscreen: none   Family History  Problem Relation Age of Onset  . Heart attack Brother        Brother #1  . Diabetes Brother   . Hypertension Brother        #3  . Coronary artery disease Father 59       deceased  . Heart attack Father   . Diabetes Father   . Heart disease Father   . COPD Mother 86       deceased  . Alcohol abuse Sister        polysubstance abuse  . COPD Sister   . Lung cancer Sister   . Alcohol abuse Sister        polysubstance abuse  . Penile cancer Brother   . Diabetes Brother  Objective:    BP 122/82   Pulse (!) 116   Temp 98 F (36.7 C)   Resp 16   Ht 5' 9.29" (1.76 m)   Wt 140 lb (63.5 kg)   SpO2 93%   BMI 20.50 kg/m  Physical Exam  Constitutional: He is oriented to person, place, and time. He appears well-developed and well-nourished. No distress.  HENT:  Head: Normocephalic and atraumatic.  Right Ear: External ear normal.  Left Ear: External ear normal.  Nose: Nose normal.  Mouth/Throat: Oropharynx is clear and moist.  Eyes: Conjunctivae and EOM are normal. Pupils are equal, round, and reactive to light.  Neck: Normal range of motion. Neck supple. Carotid bruit is not present. No thyromegaly present.  Cardiovascular: Normal rate, regular rhythm, normal heart sounds and intact distal pulses. Exam reveals no gallop and no friction rub.  No murmur heard. Pulmonary/Chest: Effort normal. No accessory muscle usage. No tachypnea. No respiratory distress. He has wheezes in the right lower field and the left lower field. He has no rales.  Abdominal: Soft.  Bowel sounds are normal. He exhibits no distension and no mass. There is no tenderness. There is no rebound and no guarding.  Lymphadenopathy:    He has no cervical adenopathy.  Neurological: He is alert and oriented to person, place, and time. No cranial nerve deficit.  Skin: Skin is warm and dry. No rash noted. He is not diaphoretic.  Psychiatric: His speech is normal and behavior is normal. Judgment and thought content normal. His mood appears anxious. Cognition and memory are normal. He exhibits a depressed mood.  Nursing note and vitals reviewed.  No results found. Depression screen [facility] 2/9 01/09/2018 11/16/2017 10/05/2017 09/07/2017 08/19/2017  Decreased Interest 3 2 1 2 3   Down, Depressed, Hopeless 3 2 1 2 2   PHQ - 2 Score 6 4 2 4 5   Altered sleeping 3 1 0 2 3  Tired, decreased energy 3 1 1 2 3   Change in appetite 3 2 0 2 2  Feeling bad or failure about yourself  2 1 1 2 1   Trouble concentrating 2 0 0 2 2  Moving slowly or fidgety/restless 0 0 0 0 1  Suicidal thoughts 0 0 0 0 0  PHQ-9 Score 19 9 4 14 17   Difficult doing work/chores - - - - -  Some recent data might be hidden   Fall Risk  01/09/2018 11/16/2017 10/05/2017 09/07/2017 08/19/2017  Falls in the past year? No No No No No        Assessment & Plan:   1. COPD exacerbation ([place])   2. COPD, very severe ([place])   3. DEPRESSION/ANXIETY   4. SMOKER   5. Grief reaction    Admission notes reviewed in detail during visit.  Treated with steroids and antibiotic therapy during admission for COPD exacerbation.  Clinically improved during admission.  Status post DuoNeb in office today.  Compliance with medications for COPD.  I highly recommend pulmonary rehab for COPD management as well as anxiety management.  This provides patient with the opportunity to leave the home, exercise under supervision, and to participate in exercise program.  Continues to grieve the death of wife.  Suffering with major depression and anxiety.  Has  excessive anxiety regarding her own health and well-being and afraid to leave the home.  Attending second grief group counseling session with daughter this evening.  Encouraged weekly counseling sessions for grief.  Continue current medications including sertraline 150 mg daily,  Remeron 15 mg at bedtime, and lorazepam twice daily.  Highly recommend regular exercise with pulmonary rehab for depression management.  Patient denies suicidal ideations at this time.  I highly recommend smoking cessation for patient.  Patient has identified a smoking cessation coach at [facility].  I encourage engaging in that program.  No orders of the defined types were placed in this encounter.  Meds ordered this encounter  Medications  . ipratropium-albuterol (DUONEB) 0.5-2.5 (3) MG/3ML SOLN    Sig: Take 3 mLs by nebulization every 6 (six) hours as needed.    Dispense:  225 mL    Refill:  3    DISPENSE QUANTITY SUFFICIENT FOR 3 MONTHS  . albuterol (PROVENTIL) (2.5 MG/3ML) 0.083% nebulizer solution 2.5 mg  . ipratropium (ATROVENT) nebulizer solution 0.5 mg    Return in about 4 weeks (around 02/06/2018) for follow-up chronic medical conditions.   Mikyle Sox Elayne Guerin, M.D. Primary Care at [facility] previously [facility] [place] [address] [place], [place]  [id number] [phone] phone [phone] fax

## 2018-01-09 NOTE — Patient Instructions (Signed)
     IF you received an x-ray today, you will receive an invoice from [facility]. Please contact [facility] at [phone] with questions or concerns regarding your invoice.   IF you received labwork today, you will receive an invoice from [facility]. Please contact [facility] at [phone] with questions or concerns regarding your invoice.   Our billing staff will not be able to assist you with questions regarding bills from these companies.  You will be contacted with the lab results as soon as they are available. The fastest way to get your results is to activate your My Chart account. Instructions are located on the last page of this paperwork. If you have not heard from us regarding the results in 2 weeks, please contact this office.     

## 2018-01-13 DIAGNOSIS — F4320 Adjustment disorder, unspecified: Secondary | ICD-10-CM | POA: Insufficient documentation

## 2018-01-13 DIAGNOSIS — F4321 Adjustment disorder with depressed mood: Secondary | ICD-10-CM | POA: Insufficient documentation

## 2018-01-16 ENCOUNTER — Telehealth: Payer: Self-pay | Admitting: Family Medicine

## 2018-01-16 MED ORDER — DOXYCYCLINE HYCLATE 100 MG PO CAPS: 100.0000 mg | ORAL_CAPSULE | Freq: Two times a day (BID) | ORAL | 0 refills | Status: DC

## 2018-01-16 MED ORDER — PREDNISONE 20 MG PO TABS: 20.0000 mg | ORAL_TABLET | Freq: Every day | ORAL | 0 refills | Status: DC

## 2018-01-16 NOTE — Telephone Encounter (Signed)
Cough and congestion for two days after spending time in horse barn.  A/P: COPD exacerbation: doxy and prednisone sent in.

## 2018-01-18 ENCOUNTER — Telehealth: Payer: Self-pay | Admitting: Family Medicine

## 2018-01-18 MED ORDER — IPRATROPIUM-ALBUTEROL 0.5-2.5 (3) MG/3ML IN SOLN: 3.0000 mL | Freq: Four times a day (QID) | RESPIRATORY_TRACT | 3 refills | Status: DC | PRN

## 2018-01-18 NOTE — Telephone Encounter (Signed)
Patient needs duoneb resent to mail order pharmacy.

## 2018-01-24 ENCOUNTER — Encounter: Payer: Self-pay | Admitting: *Deleted

## 2018-01-24 ENCOUNTER — Encounter: Payer: BLUE CROSS/BLUE SHIELD | Attending: Pulmonary Disease | Admitting: *Deleted

## 2018-01-24 VITALS — Ht 69.5 in | Wt 141.8 lb

## 2018-01-24 DIAGNOSIS — J449 Chronic obstructive pulmonary disease, unspecified: Secondary | ICD-10-CM | POA: Insufficient documentation

## 2018-01-24 DIAGNOSIS — J9611 Chronic respiratory failure with hypoxia: Secondary | ICD-10-CM | POA: Diagnosis present

## 2018-01-24 NOTE — Progress Notes (Signed)
Pulmonary Individual Treatment Plan  Patient Details  Name: Leslie Sims MRN: [id number] Date of Birth: Jun 16, 1951 Referring Provider:     Pulmonary Rehab from 01/24/2018 in [facility] Cardiac and Pulmonary Rehab  Referring Provider  [name]      Initial Encounter Date:    Pulmonary Rehab from 01/24/2018 in [facility] Cardiac and Pulmonary Rehab  Date  01/24/18  Referring Provider  [name]      Visit Diagnosis: Chronic obstructive pulmonary disease, unspecified COPD type ([place])  Patient's Home Medications on Admission:  Current Outpatient Medications:  .  albuterol (VENTOLIN HFA) 108 (90 Base) MCG/ACT inhaler, USE 2 INHALATIONS EVERY 6 HOURS AS NEEDED FOR WHEEZING, Disp: 48 g, Rfl: 3 .  aspirin 81 MG tablet, Take 81 mg by mouth daily.  , Disp: , Rfl:  .  atorvastatin (LIPITOR) 40 MG tablet, Take 1 tablet (40 mg total) by mouth daily., Disp: 90 tablet, Rfl: 3 .  Fluticasone-Salmeterol (ADVAIR DISKUS) 500-50 MCG/DOSE AEPB, Inhale 1 puff into the lungs 2 (two) times daily., Disp: 180 each, Rfl: 3 .  furosemide (LASIX) 20 MG tablet, Take 1 tablet (20 mg total) by mouth daily., Disp: 30 tablet, Rfl: 0 .  ipratropium-albuterol (DUONEB) 0.5-2.5 (3) MG/3ML SOLN, Take 3 mLs by nebulization every 6 (six) hours as needed., Disp: 225 mL, Rfl: 3 .  ketoconazole (NIZORAL) 2 % cream, Apply 1 application topically 2 (two) times daily., Disp: 60 g, Rfl: 0 .  LORazepam (ATIVAN) 1 MG tablet, TAKE 1/2 TABLET BY MOUTH EVERY 6 HOURS AS NEEDED FOR ANXIETY, Disp: 60 tablet, Rfl: 1 .  losartan (COZAAR) 25 MG tablet, Take 1 tablet (25 mg total) by mouth daily., Disp: 90 tablet, Rfl: 3 .  mirtazapine (REMERON) 15 MG tablet, Take 1 tablet (15 mg total) by mouth at bedtime., Disp: 30 tablet, Rfl: 5 .  Multiple Vitamin (MULTIVITAMIN) capsule, Take 1 capsule by mouth daily., Disp: , Rfl:  .  nitroGLYCERIN (NITROSTAT) 0.4 MG SL tablet, Place 1 tablet (0.4 mg total) under the tongue every 5 (five) minutes as needed., Disp:  25 tablet, Rfl: 3 .  sertraline (ZOLOFT) 100 MG tablet, Take 1 tablet (100 mg total) by mouth at bedtime., Disp: 90 tablet, Rfl: 1 .  sucralfate (CARAFATE) 1 GM/10ML suspension, Take 10 mLs (1 g total) by mouth 4 (four) times daily -  with meals and at bedtime., Disp: 420 mL, Rfl: 4 .  tiotropium (SPIRIVA HANDIHALER) 18 MCG inhalation capsule, Place 1 capsule (18 mcg total) into inhaler and inhale daily., Disp: 90 capsule, Rfl: 3 .  amoxicillin-clavulanate (AUGMENTIN) 875-125 MG tablet, Take 1 tablet by mouth every 12 (twelve) hours. (Patient not taking: Reported on 01/24/2018), Disp: 4 tablet, Rfl: 0 .  doxycycline (VIBRAMYCIN) 100 MG capsule, Take 1 capsule (100 mg total) by mouth 2 (two) times daily. (Patient not taking: Reported on 01/24/2018), Disp: 20 capsule, Rfl: 0 .  levalbuterol (XOPENEX) 1.25 MG/3ML nebulizer solution, Inhale into the lungs., Disp: , Rfl:  .  predniSONE (DELTASONE) 20 MG tablet, Take 1 tablet (20 mg total) by mouth daily with breakfast. (Patient not taking: Reported on 01/24/2018), Disp: 10 tablet, Rfl: 0 .  predniSONE (STERAPRED UNI-PAK 21 TAB) 10 MG (21) TBPK tablet, Start 60 mg po daily, taper 10 mg once daily until done (Patient not taking: Reported on 01/24/2018), Disp: 21 tablet, Rfl: 0  Past Medical History: Past Medical History:  Diagnosis Date  . Allergic rhinitis   . Anxiety   . Atherosclerosis   . Back  pain   . Bronchitis    09-14-11  . CARDIOMYOPATHY 03/24/2010   did not tolerate Toprol and Lisinopril.   . Chest pain   . CHF (congestive heart failure) ([place])   . Chronic airway obstruction, not elsewhere classified   . Colon polyps 08/2012   colonoscopy; multiple colon polyps; repeat colonoscopy in one year.  [name] COPD (chronic obstructive pulmonary disease) ([place])   . Coronary artery disease 11/2009   Anterior MI with late presentation 11/2009. Cath showed a 100% proximal LAD occlusion, 99% mid RCA. Had a PCI and 2 DES to both LAD and RCA. EF was 35%. Nuclear stress  test 05/13: prior anterior/inferior infarcts without ischemic, EF 43%, cardiac cath in 02/14: Widely patent stents with no other obstructive disease. EF 40%   . Depression   . Diabetes mellitus without complication ([place])   . GERD (gastroesophageal reflux disease)   . Headache(784.0)   . Helicobacter pylori (H. pylori)   . Hemorrhoids   . Hernia    inguinal  . Hyperlipidemia   . Hypertension   . Insomnia   . MI (myocardial infarction) ([place])   . Myalgia   . Peptic ulcer   . Shortness of breath dyspnea   . Status post dilation of esophageal narrowing 2000  . Thyroid dysfunction   . Tinea pedis   . Wears dentures    partial upper    Tobacco Use: Social History   Tobacco Use  Smoking Status Current Every Day Smoker  . Packs/day: 1.00  . Years: 42.00  . Pack years: 42.00  . Types: Cigarettes  Smokeless Tobacco Never Used  Tobacco Comment   01/24/2018 states smoking less than a pack a day. Wants to quit.  Has contact number for smoking cessation counselor    Labs: Recent Review Flowsheet Data    Labs for ITP Cardiac and Pulmonary Rehab Latest Ref Rng & Units 03/01/2017 06/17/2017 07/27/2017 12/23/2017 12/25/2017   Cholestrol 100 - 199 mg/dL 112 - 143 - -   LDLCALC 0 - 99 mg/dL 47 - 67 - -   HDL >39 mg/dL 33(L) - 62 - -   Trlycerides 0 - 149 mg/dL 158(H) - 72 - -   Hemoglobin A1c 4.8 - 5.6 % 5.3 5.8(H) - - -   HCO3 20.0 - 28.0 mmol/L - - - 37.2(H) 41.4(H)   O2SAT % - - - 87.4 81.6       Pulmonary Assessment Scores: Pulmonary Assessment Scores    Row Name 01/24/18 1247         ADL UCSD   ADL Phase  Entry New Entry numbers 01/24/2018     SOB Score total  86     Rest  2     Walk  4     Stairs  5     Bath  3     Dress  4     Shop  5       CAT Score   CAT Score  28       mMRC Score   mMRC Score  1        Pulmonary Function Assessment: Pulmonary Function Assessment - 01/24/18 1246      Pulmonary Function Tests   FVC%  72 % date performed 11/01/17    FEV1%  27 %     FEV1/FVC Ratio  30      Breath   Shortness of Breath  Yes;Limiting activity;Fear of Shortness of Breath  Exercise Target Goals: Date: 01/24/18  Exercise Program Goal: Individual exercise prescription set using results from initial 6 min walk test and THRR while considering  patient's activity barriers and safety.    Exercise Prescription Goal: Initial exercise prescription builds to 30-45 minutes a day of aerobic activity, 2-3 days per week.  Home exercise guidelines will be given to patient during program as part of exercise prescription that the participant will acknowledge.  Activity Barriers & Risk Stratification:   6 Minute Walk: 6 Minute Walk    Row Name 01/24/18 1258         6 Minute Walk   Distance  1000 feet     Walk Time  4.75 minutes     # of Rest Breaks  1     MPH  2.4     METS  3.4     RPE  11     Perceived Dyspnea   2     VO2 Peak  11.9     Symptoms  No     Resting HR  93 bpm     Resting BP  106/72     Resting Oxygen Saturation   95 %     Exercise Oxygen Saturation  during 6 min walk  80 %     Max Ex. HR  123 bpm     Max Ex. BP  132/64     2 Minute Post BP  108/64       Interval HR   1 Minute HR  106     2 Minute HR  104     3 Minute HR  102     4 Minute HR  123     6 Minute HR  120     2 Minute Post HR  97     Interval Heart Rate?  Yes       Interval Oxygen   Interval Oxygen?  Yes     Baseline Oxygen Saturation %  95 %     1 Minute Oxygen Saturation %  82 %     1 Minute Liters of Oxygen  2 L     2 Minute Oxygen Saturation %  82 %     2 Minute Liters of Oxygen  2 L     3 Minute Oxygen Saturation %  81 %     3 Minute Liters of Oxygen  2 L     4 Minute Oxygen Saturation %  83 %     4 Minute Liters of Oxygen  2 L     5 Minute Oxygen Saturation %  81 %     5 Minute Liters of Oxygen  2 L     6 Minute Oxygen Saturation %  80 %     6 Minute Liters of Oxygen  2 L     2 Minute Post Oxygen Saturation %  95 %     2 Minute Post Liters of  Oxygen  2 L       Oxygen Initial Assessment: Oxygen Initial Assessment - 01/24/18 1220      Home Oxygen   Home Oxygen Device  Home Concentrator;E-Tanks    Sleep Oxygen Prescription  Continuous    Liters per minute  2    Home Exercise Oxygen Prescription  Continuous    Liters per minute  2 UNtil sees his pulmonary Doctor again.  This is post discharge form January 2019    Home at Rest Exercise Oxygen Prescription  Continuous    Liters per minute  2    Compliance with Home Oxygen Use  Yes      Initial 6 min Walk   Oxygen Used  Continuous;E-Tanks    Liters per minute  2      Program Oxygen Prescription   Program Oxygen Prescription  Continuous;E-Tanks    Liters per minute  2      Intervention   Short Term Goals  To learn and exhibit compliance with exercise, home and travel O2 prescription;To learn and understand importance of monitoring SPO2 with pulse oximeter and demonstrate accurate use of the pulse oximeter.;To learn and understand importance of maintaining oxygen saturations>88%;To learn and demonstrate proper use of respiratory medications;To learn and demonstrate proper pursed lip breathing techniques or other breathing techniques.    Long  Term Goals  Exhibits compliance with exercise, home and travel O2 prescription;Verbalizes importance of monitoring SPO2 with pulse oximeter and return demonstration;Maintenance of O2 saturations>88%;Exhibits proper breathing techniques, such as pursed lip breathing or other method taught during program session;Compliance with respiratory medication;Demonstrates proper use of MDI's       Oxygen Re-Evaluation:   Oxygen Discharge (Final Oxygen Re-Evaluation):   Initial Exercise Prescription: Initial Exercise Prescription - 01/24/18 1300      Date of Initial Exercise RX and Referring Provider   Date  01/24/18    Referring Provider  [name]      Oxygen   Oxygen  Continuous    Liters  2      Treadmill   MPH  2.4    Grade  1.4     Minutes  15    METs  3.3      Recumbant Bike   Level  1    RPM  60    Watts  25    Minutes  15    METs  3.3      T5 Nustep   Level  2    SPM  80    Minutes  15    METs  3.3      Prescription Details   Frequency (times per week)  3    Duration  Progress to 45 minutes of aerobic exercise without signs/symptoms of physical distress      Intensity   THRR 40-80% of Max Heartrate  118-143    Ratings of Perceived Exertion  11-13    Perceived Dyspnea  0-4      Resistance Training   Training Prescription  Yes    Weight  3 lb    Reps  10-15       Perform Capillary Blood Glucose checks as needed.  Exercise Prescription Changes:   Exercise Comments:   Exercise Goals and Review: Exercise Goals    Row Name 01/24/18 1257             Exercise Goals   Increase Physical Activity  Yes       Intervention  Provide advice, education, support and counseling about physical activity/exercise needs.;Develop an individualized exercise prescription for aerobic and resistive training based on initial evaluation findings, risk stratification, comorbidities and participant's personal goals.       Expected Outcomes  Short Term: Attend rehab on a regular basis to increase amount of physical activity.;Long Term: Add in home exercise to make exercise part of routine and to increase amount of physical activity.;Long Term: Exercising regularly at least 3-5 days a week.       Increase Strength and Stamina  Yes  Intervention  Provide advice, education, support and counseling about physical activity/exercise needs.;Develop an individualized exercise prescription for aerobic and resistive training based on initial evaluation findings, risk stratification, comorbidities and participant's personal goals.       Expected Outcomes  Short Term: Increase workloads from initial exercise prescription for resistance, speed, and METs.;Short Term: Perform resistance training exercises routinely during rehab  and add in resistance training at home;Long Term: Improve cardiorespiratory fitness, muscular endurance and strength as measured by increased METs and functional capacity (6MWT)       Able to understand and use rate of perceived exertion (RPE) scale  Yes       Intervention  Provide education and explanation on how to use RPE scale       Expected Outcomes  Short Term: Able to use RPE daily in rehab to express subjective intensity level;Long Term:  Able to use RPE to guide intensity level when exercising independently       Able to understand and use Dyspnea scale  Yes       Intervention  Provide education and explanation on how to use Dyspnea scale       Expected Outcomes  Short Term: Able to use Dyspnea scale daily in rehab to express subjective sense of shortness of breath during exertion;Long Term: Able to use Dyspnea scale to guide intensity level when exercising independently       Knowledge and understanding of Target Heart Rate Range (THRR)  Yes       Intervention  Provide education and explanation of THRR including how the numbers were predicted and where they are located for reference       Expected Outcomes  Short Term: Able to state/look up THRR;Long Term: Able to use THRR to govern intensity when exercising independently;Short Term: Able to use daily as guideline for intensity in rehab       Able to check pulse independently  Yes       Intervention  Provide education and demonstration on how to check pulse in carotid and radial arteries.;Review the importance of being able to check your own pulse for safety during independent exercise       Expected Outcomes  Short Term: Able to explain why pulse checking is important during independent exercise;Long Term: Able to check pulse independently and accurately       Understanding of Exercise Prescription  Yes       Intervention  Provide education, explanation, and written materials on patient's individual exercise prescription       Expected  Outcomes  Short Term: Able to explain program exercise prescription;Long Term: Able to explain home exercise prescription to exercise independently          Exercise Goals Re-Evaluation :   Discharge Exercise Prescription (Final Exercise Prescription Changes):   Nutrition:  Target Goals: Understanding of nutrition guidelines, daily intake of sodium <1532m, cholesterol <2052m calories 30% from fat and 7% or less from saturated fats, daily to have 5 or more servings of fruits and vegetables.  Biometrics: Pre Biometrics - 01/24/18 1257      Pre Biometrics   Height  5' 9.5" (1.765 m)    Weight  141 lb 12.8 oz (64.3 kg)    Waist Circumference  35 inches    Hip Circumference  37.25 inches    Waist to Hip Ratio  0.94 %    BMI (Calculated)  20.65        Nutrition Therapy Plan and Nutrition Goals: Nutrition Therapy &  Goals - 01/24/18 1226      Intervention Plan   Intervention  Prescribe, educate and counsel regarding individualized specific dietary modifications aiming towards targeted core components such as weight, hypertension, lipid management, diabetes, heart failure and other comorbidities.    Expected Outcomes  Short Term Goal: Understand basic principles of dietary content, such as calories, fat, sodium, cholesterol and nutrients.;Short Term Goal: A plan has been developed with personal nutrition goals set during dietitian appointment.;Long Term Goal: Adherence to prescribed nutrition plan.       Nutrition Assessments: Nutrition Assessments - 01/24/18 1224      MEDFICTS Scores   Pre Score  38       Nutrition Goals Re-Evaluation:   Nutrition Goals Discharge (Final Nutrition Goals Re-Evaluation):   Psychosocial: Target Goals: Acknowledge presence or absence of significant depression and/or stress, maximize coping skills, provide positive support system. Participant is able to verbalize types and ability to use techniques and skills needed for reducing stress and  depression.   Initial Review & Psychosocial Screening: Initial Psych Review & Screening - 01/24/18 1251      Initial Review   Comments  Landry states that he tries to get out of the house, away from home,at least 30 min every day.   He does have  a CAT Scan due soon for followup of nodules found earlier in his chest.  Biopsy did indicate not cancer.       Family Dynamics   Good Support System?  -- Harce has his 2 dogs at home that are great companions to him       Quality of Life Scores:  Scores of 19 and below usually indicate a poorer quality of life in these areas.  A difference of  2-3 points is a clinically meaningful difference.  A difference of 2-3 points in the total score of the Quality of Life Index has been associated with significant improvement in overall quality of life, self-image, physical symptoms, and general health in studies assessing change in quality of life.  PHQ-9: Recent Review Flowsheet Data    Depression screen [facility] 2/9 01/24/2018 01/09/2018 11/16/2017 10/05/2017 09/07/2017   Decreased Interest 3 3 2 1 2    Down, Depressed, Hopeless 3 3 2 1 2    PHQ - 2 Score 6 6 4 2 4    Altered sleeping 3 3 1  0 2   Tired, decreased energy 3 3 1 1 2    Change in appetite 3 3 2  0 2   Feeling bad or failure about yourself  1 2 1 1 2    Trouble concentrating 3 2 0 0 2   Moving slowly or fidgety/restless 3 0 0 0 0   Suicidal thoughts 0 0 0 0 0   PHQ-9 Score 22 19 9 4 14    Difficult doing work/chores Extremely dIfficult - - - -     Interpretation of Total Score  Total Score Depression Severity:  1-4 = Minimal depression, 5-9 = Mild depression, 10-14 = Moderate depression, 15-19 = Moderately severe depression, 20-27 = Severe depression   Psychosocial Evaluation and Intervention:   Psychosocial Re-Evaluation:   Psychosocial Discharge (Final Psychosocial Re-Evaluation):   Education: Education Goals: Education classes will be provided on a weekly basis, covering required  topics. Participant will state understanding/return demonstration of topics presented.  Learning Barriers/Preferences: Learning Barriers/Preferences - 01/24/18 1237      Learning Barriers/Preferences   Learning Barriers  None    Learning Preferences  Verbal Instruction;Video  Education Topics:  Initial Evaluation Education: - Verbal, written and demonstration of respiratory meds, oximetry and breathing techniques. Instruction on use of nebulizers and MDIs and importance of monitoring MDI activations.   Pulmonary Rehab from 01/24/2018 in [facility] Cardiac and Pulmonary Rehab  Date  01/24/18  Educator  SB  Instruction Review Code  1- Verbalizes Understanding      General Nutrition Guidelines/Fats and Fiber: -Group instruction provided by verbal, written material, models and posters to present the general guidelines for heart healthy nutrition. Gives an explanation and review of dietary fats and fiber.   Pulmonary Rehab from 04/18/2017 in [facility] Cardiac and Pulmonary Rehab  Date  04/18/17  Educator  CR  Instruction Review Code (retired)  2- meets goals/outcomes      Controlling Sodium/Reading Food Labels: -Group verbal and written material supporting the discussion of sodium use in heart healthy nutrition. Review and explanation with models, verbal and written materials for utilization of the food label.   Exercise Physiology & General Exercise Guidelines: - Group verbal and written instruction with models to review the exercise physiology of the cardiovascular system and associated critical values. Provides general exercise guidelines with specific guidelines to those with heart or lung disease.    Aerobic Exercise & Resistance Training: - Gives group verbal and written instruction on the various components of exercise. Focuses on aerobic and resistive training programs and the benefits of this training and how to safely progress through these programs.   Flexibility, Balance,  Mind/Body Relaxation: Provides group verbal/written instruction on the benefits of flexibility and balance training, including mind/body exercise modes such as yoga, pilates and tai chi.  Demonstration and skill practice provided.   Stress and Anxiety: - Provides group verbal and written instruction about the health risks of elevated stress and causes of high stress.  Discuss the correlation between heart/lung disease and anxiety and treatment options. Review healthy ways to manage with stress and anxiety.   Depression: - Provides group verbal and written instruction on the correlation between heart/lung disease and depressed mood, treatment options, and the stigmas associated with seeking treatment.   Exercise & Equipment Safety: - Individual verbal instruction and demonstration of equipment use and safety with use of the equipment.   Pulmonary Rehab from 01/24/2018 in [facility] Cardiac and Pulmonary Rehab  Date  01/24/18  Educator  Sb  Instruction Review Code  1- Verbalizes Understanding      Infection Prevention: - Provides verbal and written material to individual with discussion of infection control including proper hand washing and proper equipment cleaning during exercise session.   Pulmonary Rehab from 01/24/2018 in [facility] Cardiac and Pulmonary Rehab  Date  01/24/18  Educator  Sb  Instruction Review Code  1- Verbalizes Understanding      Falls Prevention: - Provides verbal and written material to individual with discussion of falls prevention and safety.   Pulmonary Rehab from 01/24/2018 in [facility] Cardiac and Pulmonary Rehab  Date  01/24/18  Educator  SB  Instruction Review Code  1- Verbalizes Understanding      Diabetes: - Individual verbal and written instruction to review signs/symptoms of diabetes, desired ranges of glucose level fasting, after meals and with exercise. Advice that pre and post exercise glucose checks will be done for 3 sessions at entry of program.   Chronic  Lung Diseases: - Group verbal and written instruction to review updates, respiratory medications, advancements in procedures and treatments. Discuss use of supplemental oxygen including available portable oxygen systems, continuous and intermittent  flow rates, concentrators, personal use and safety guidelines. Review proper use of inhaler and spacers. Provide informative websites for self-education.    Energy Conservation: - Provide group verbal and written instruction for methods to conserve energy, plan and organize activities. Instruct on pacing techniques, use of adaptive equipment and posture/positioning to relieve shortness of breath.   Triggers and Exacerbations: - Group verbal and written instruction to review types of environmental triggers and ways to prevent exacerbations. Discuss weather changes, air quality and the benefits of nasal washing. Review warning signs and symptoms to help prevent infections. Discuss techniques for effective airway clearance, coughing, and vibrations.   AED/CPR: - Group verbal and written instruction with the use of models to demonstrate the basic use of the AED with the basic ABC's of resuscitation.   Anatomy and Physiology of the Lungs: - Group verbal and written instruction with the use of models to provide basic lung anatomy and physiology related to function, structure and complications of lung disease.   Anatomy & Physiology of the Heart: - Group verbal and written instruction and models provide basic cardiac anatomy and physiology, with the coronary electrical and arterial systems. Review of Valvular disease and Heart Failure   Cardiac Medications: - Group verbal and written instruction to review commonly prescribed medications for heart disease. Reviews the medication, class of the drug, and side effects.   Know Your Numbers and Risk Factors: -Group verbal and written instruction about important numbers in your health.  Discussion of what  are risk factors and how they play a role in the disease process.  Review of Cholesterol, Blood Pressure, Diabetes, and BMI and the role they play in your overall health.   Sleep Hygiene: -Provides group verbal and written instruction about how sleep can affect your health.  Define sleep hygiene, discuss sleep cycles and impact of sleep habits. Review good sleep hygiene tips.    Other: -Provides group and verbal instruction on various topics (see comments)    Knowledge Questionnaire Score: Knowledge Questionnaire Score - 01/24/18 1237      Knowledge Questionnaire Score   Pre Score  15/18 Reviewed correct responses with Ziv. He verbalized understading of the correct response. He had no questions today        Core Components/Risk Factors/Patient Goals at Admission: Personal Goals and Risk Factors at Admission - 01/24/18 1226      Core Components/Risk Factors/Patient Goals on Admission    Weight Management  Yes    Intervention  Weight Management: Develop a combined nutrition and exercise program designed to reach desired caloric intake, while maintaining appropriate intake of nutrient and fiber, sodium and fats, and appropriate energy expenditure required for the weight goal.;Weight Management: Provide education and appropriate resources to help participant work on and attain dietary goals.;Weight Management/Obesity: Establish reasonable short term and long term weight goals.    Admit Weight  141 lb 12.8 oz (64.3 kg)    Goal Weight: Short Term  145 lb (65.8 kg)    Goal Weight: Long Term  145 lb (65.8 kg)    Expected Outcomes  Short Term: Continue to assess and modify interventions until short term weight is achieved;Long Term: Adherence to nutrition and physical activity/exercise program aimed toward attainment of established weight goal;Weight Maintenance: Understanding of the daily nutrition guidelines, which includes 25-35% calories from fat, 7% or less cal from saturated fats, less  than 210m cholesterol, less than 1.5gm of sodium, & 5 or more servings of fruits and vegetables daily;Understanding recommendations for meals  to include 15-35% energy as protein, 25-35% energy from fat, 35-60% energy from carbohydrates, less than 292m of dietary cholesterol, 20-35 gm of total fiber daily;Understanding of distribution of calorie intake throughout the day with the consumption of 4-5 meals/snacks    Tobacco Cessation  Yes    Intervention  Assist the participant in steps to quit. Provide individualized education and counseling about committing to Tobacco Cessation, relapse prevention, and pharmacological support that can be provided by physician.;OAdvice worker assist with locating and accessing local/national Quit Smoking programs, and support quit date choice. Given smoking cessation packet. Reviewed need to see the counselor to help him with the smoking cessation journey.    Expected Outcomes  Short Term: Will demonstrate readiness to quit, by selecting a quit date.;Long Term: Complete abstinence from all tobacco products for at least 12 months from quit date.;Short Term: Will quit all tobacco product use, adhering to prevention of relapse plan.    Improve shortness of breath with ADL's  Yes    Intervention  Provide education, individualized exercise plan and daily activity instruction to help decrease symptoms of SOB with activities of daily living.    Expected Outcomes  Short Term: Improve cardiorespiratory fitness to achieve a reduction of symptoms when performing ADLs;Long Term: Be able to perform more ADLs without symptoms or delay the onset of symptoms    Heart Failure  Yes    Intervention  Provide a combined exercise and nutrition program that is supplemented with education, support and counseling about heart failure. Directed toward relieving symptoms such as shortness of breath, decreased exercise tolerance, and extremity edema.    Expected Outcomes  Improve  functional capacity of life;Short term: Attendance in program 2-3 days a week with increased exercise capacity. Reported lower sodium intake. Reported increased fruit and vegetable intake. Reports medication compliance.;Short term: Daily weights obtained and reported for increase. Utilizing diuretic protocols set by physician.;Long term: Adoption of self-care skills and reduction of barriers for early signs and symptoms recognition and intervention leading to self-care maintenance.    Hypertension  Yes    Intervention  Provide education on lifestyle modifcations including regular physical activity/exercise, weight management, moderate sodium restriction and increased consumption of fresh fruit, vegetables, and low fat dairy, alcohol moderation, and smoking cessation.;Monitor prescription use compliance.    Expected Outcomes  Short Term: Continued assessment and intervention until BP is < 140/967mHG in hypertensive participants. < 130/8015mG in hypertensive participants with diabetes, heart failure or chronic kidney disease.;Long Term: Maintenance of blood pressure at goal levels.    Lipids  Yes    Intervention  Provide education and support for participant on nutrition & aerobic/resistive exercise along with prescribed medications to achieve LDL <67m59mDL >40mg69m Expected Outcomes  Short Term: Participant states understanding of desired cholesterol values and is compliant with medications prescribed. Participant is following exercise prescription and nutrition guidelines.;Long Term: Cholesterol controlled with medications as prescribed, with individualized exercise RX and with personalized nutrition plan. Value goals: LDL < 67mg,75m > 40 mg.    Stress  Yes    Intervention  Offer individual and/or small group education and counseling on adjustment to heart disease, stress management and health-related lifestyle change. Teach and support self-help strategies.;Refer participants experiencing significant  psychosocial distress to appropriate mental health specialists for further evaluation and treatment. When possible, include family members and significant others in education/counseling sessions. Loss of three family members in the past year. One being his wife of 37 yea56.  Continues to grieve for his wife.Is attending grief counseling program.     Expected Outcomes  Short Term: Participant demonstrates changes in health-related behavior, relaxation and other stress management skills, ability to obtain effective social support, and compliance with psychotropic medications if prescribed.;Long Term: Emotional wellbeing is indicated by absence of clinically significant psychosocial distress or social isolation.       Core Components/Risk Factors/Patient Goals Review:    Core Components/Risk Factors/Patient Goals at Discharge (Final Review):    ITP Comments: ITP Comments    Row Name 01/24/18 1218           ITP Comments  Medical review completed today. ITP to Dr Sabra Heck for review, changes as needed and signature.   Documentation of diagnosis can be found in [facility] Encounter 12/25/2017.  New admission. Did not complete program last year, because his wife became very sick and died in July 24, 2023.           Comments: Initial ITP

## 2018-01-24 NOTE — Patient Instructions (Signed)
Patient Instructions  Patient Details  Name: Leslie Sims MRN: [id number] Date of Birth: July 20, 1951 Referring Provider:  Wilhelmina Mcardle, MD  Below are your personal goals for exercise, nutrition, and risk factors. Our goal is to help you stay on track towards obtaining and maintaining these goals. We will be discussing your progress on these goals with you throughout the program.  Initial Exercise Prescription: Initial Exercise Prescription - 01/24/18 1300      Date of Initial Exercise RX and Referring Provider   Date  01/24/18    Referring Provider  [name]      Oxygen   Oxygen  Continuous    Liters  2      Treadmill   MPH  2.4    Grade  1.4    Minutes  15    METs  3.3      Recumbant Bike   Level  1    RPM  60    Watts  25    Minutes  15    METs  3.3      T5 Nustep   Level  2    SPM  80    Minutes  15    METs  3.3      Prescription Details   Frequency (times per week)  3    Duration  Progress to 45 minutes of aerobic exercise without signs/symptoms of physical distress      Intensity   THRR 40-80% of Max Heartrate  118-143    Ratings of Perceived Exertion  11-13    Perceived Dyspnea  0-4      Resistance Training   Training Prescription  Yes    Weight  3 lb    Reps  10-15       Exercise Goals: Frequency: Be able to perform aerobic exercise two to three times per week in program working toward 2-5 days per week of home exercise.  Intensity: Work with a perceived exertion of 11 (fairly light) - 15 (hard) while following your exercise prescription.  We will make changes to your prescription with you as you progress through the program.   Duration: Be able to do 30 to 45 minutes of continuous aerobic exercise in addition to a 5 minute warm-up and a 5 minute cool-down routine.   Nutrition Goals: Your personal nutrition goals will be established when you do your nutrition analysis with the dietician.  The following are general nutrition guidelines  to follow: Cholesterol < 200mg /day Sodium < 1500mg /day Fiber: Men over 50 yrs - 30 grams per day  Personal Goals: Personal Goals and Risk Factors at Admission - 01/24/18 1226      Core Components/Risk Factors/Patient Goals on Admission    Weight Management  Yes    Intervention  Weight Management: Develop a combined nutrition and exercise program designed to reach desired caloric intake, while maintaining appropriate intake of nutrient and fiber, sodium and fats, and appropriate energy expenditure required for the weight goal.;Weight Management: Provide education and appropriate resources to help participant work on and attain dietary goals.;Weight Management/Obesity: Establish reasonable short term and long term weight goals.    Admit Weight  141 lb 12.8 oz (64.3 kg)    Goal Weight: Short Term  145 lb (65.8 kg)    Goal Weight: Long Term  145 lb (65.8 kg)    Expected Outcomes  Short Term: Continue to assess and modify interventions until short term weight is achieved;Long Term: Adherence to nutrition and physical activity/exercise  program aimed toward attainment of established weight goal;Weight Maintenance: Understanding of the daily nutrition guidelines, which includes 25-35% calories from fat, 7% or less cal from saturated fats, less than 200mg  cholesterol, less than 1.5gm of sodium, & 5 or more servings of fruits and vegetables daily;Understanding recommendations for meals to include 15-35% energy as protein, 25-35% energy from fat, 35-60% energy from carbohydrates, less than 200mg  of dietary cholesterol, 20-35 gm of total fiber daily;Understanding of distribution of calorie intake throughout the day with the consumption of 4-5 meals/snacks    Tobacco Cessation  Yes    Intervention  Assist the participant in steps to quit. Provide individualized education and counseling about committing to Tobacco Cessation, relapse prevention, and pharmacological support that can be provided by physician.;Ship broker, assist with locating and accessing local/national Quit Smoking programs, and support quit date choice. Given smoking cessation packet. Reviewed need to see the counselor to help him with the smoking cessation journey.    Expected Outcomes  Short Term: Will demonstrate readiness to quit, by selecting a quit date.;Long Term: Complete abstinence from all tobacco products for at least 12 months from quit date.;Short Term: Will quit all tobacco product use, adhering to prevention of relapse plan.    Improve shortness of breath with ADL's  Yes    Intervention  Provide education, individualized exercise plan and daily activity instruction to help decrease symptoms of SOB with activities of daily living.    Expected Outcomes  Short Term: Improve cardiorespiratory fitness to achieve a reduction of symptoms when performing ADLs;Long Term: Be able to perform more ADLs without symptoms or delay the onset of symptoms    Heart Failure  Yes    Intervention  Provide a combined exercise and nutrition program that is supplemented with education, support and counseling about heart failure. Directed toward relieving symptoms such as shortness of breath, decreased exercise tolerance, and extremity edema.    Expected Outcomes  Improve functional capacity of life;Short term: Attendance in program 2-3 days a week with increased exercise capacity. Reported lower sodium intake. Reported increased fruit and vegetable intake. Reports medication compliance.;Short term: Daily weights obtained and reported for increase. Utilizing diuretic protocols set by physician.;Long term: Adoption of self-care skills and reduction of barriers for early signs and symptoms recognition and intervention leading to self-care maintenance.    Hypertension  Yes    Intervention  Provide education on lifestyle modifcations including regular physical activity/exercise, weight management, moderate sodium restriction and increased  consumption of fresh fruit, vegetables, and low fat dairy, alcohol moderation, and smoking cessation.;Monitor prescription use compliance.    Expected Outcomes  Short Term: Continued assessment and intervention until BP is < 140/30mm HG in hypertensive participants. < 130/77mm HG in hypertensive participants with diabetes, heart failure or chronic kidney disease.;Long Term: Maintenance of blood pressure at goal levels.    Lipids  Yes    Intervention  Provide education and support for participant on nutrition & aerobic/resistive exercise along with prescribed medications to achieve LDL 70mg , HDL >40mg .    Expected Outcomes  Short Term: Participant states understanding of desired cholesterol values and is compliant with medications prescribed. Participant is following exercise prescription and nutrition guidelines.;Long Term: Cholesterol controlled with medications as prescribed, with individualized exercise RX and with personalized nutrition plan. Value goals: LDL < 70mg , HDL > 40 mg.    Stress  Yes    Intervention  Offer individual and/or small group education and counseling on adjustment to heart disease, stress management and health-related  lifestyle change. Teach and support self-help strategies.;Refer participants experiencing significant psychosocial distress to appropriate mental health specialists for further evaluation and treatment. When possible, include family members and significant others in education/counseling sessions. Loss of three family members in the past year. One being his wife of 69 years.  Continues to grieve for his wife.Is attending grief counseling program.     Expected Outcomes  Short Term: Participant demonstrates changes in health-related behavior, relaxation and other stress management skills, ability to obtain effective social support, and compliance with psychotropic medications if prescribed.;Long Term: Emotional wellbeing is indicated by absence of clinically significant  psychosocial distress or social isolation.       Tobacco Use Initial Evaluation: Social History   Tobacco Use  Smoking Status Current Every Day Smoker  . Packs/day: 1.00  . Years: 42.00  . Pack years: 42.00  . Types: Cigarettes  Smokeless Tobacco Never Used  Tobacco Comment   01/24/2018 states smoking less than a pack a day. Wants to quit.  Has contact number for smoking cessation counselor    Exercise Goals and Review: Exercise Goals    Row Name 01/24/18 1257             Exercise Goals   Increase Physical Activity  Yes       Intervention  Provide advice, education, support and counseling about physical activity/exercise needs.;Develop an individualized exercise prescription for aerobic and resistive training based on initial evaluation findings, risk stratification, comorbidities and participant's personal goals.       Expected Outcomes  Short Term: Attend rehab on a regular basis to increase amount of physical activity.;Long Term: Add in home exercise to make exercise part of routine and to increase amount of physical activity.;Long Term: Exercising regularly at least 3-5 days a week.       Increase Strength and Stamina  Yes       Intervention  Provide advice, education, support and counseling about physical activity/exercise needs.;Develop an individualized exercise prescription for aerobic and resistive training based on initial evaluation findings, risk stratification, comorbidities and participant's personal goals.       Expected Outcomes  Short Term: Increase workloads from initial exercise prescription for resistance, speed, and METs.;Short Term: Perform resistance training exercises routinely during rehab and add in resistance training at home;Long Term: Improve cardiorespiratory fitness, muscular endurance and strength as measured by increased METs and functional capacity (6MWT)       Able to understand and use rate of perceived exertion (RPE) scale  Yes       Intervention   Provide education and explanation on how to use RPE scale       Expected Outcomes  Short Term: Able to use RPE daily in rehab to express subjective intensity level;Long Term:  Able to use RPE to guide intensity level when exercising independently       Able to understand and use Dyspnea scale  Yes       Intervention  Provide education and explanation on how to use Dyspnea scale       Expected Outcomes  Short Term: Able to use Dyspnea scale daily in rehab to express subjective sense of shortness of breath during exertion;Long Term: Able to use Dyspnea scale to guide intensity level when exercising independently       Knowledge and understanding of Target Heart Rate Range (THRR)  Yes       Intervention  Provide education and explanation of THRR including how the numbers were predicted and where they  are located for reference       Expected Outcomes  Short Term: Able to state/look up THRR;Long Term: Able to use THRR to govern intensity when exercising independently;Short Term: Able to use daily as guideline for intensity in rehab       Able to check pulse independently  Yes       Intervention  Provide education and demonstration on how to check pulse in carotid and radial arteries.;Review the importance of being able to check your own pulse for safety during independent exercise       Expected Outcomes  Short Term: Able to explain why pulse checking is important during independent exercise;Long Term: Able to check pulse independently and accurately       Understanding of Exercise Prescription  Yes       Intervention  Provide education, explanation, and written materials on patient's individual exercise prescription       Expected Outcomes  Short Term: Able to explain program exercise prescription;Long Term: Able to explain home exercise prescription to exercise independently          Copy of goals given to participant.

## 2018-01-26 ENCOUNTER — Other Ambulatory Visit: Payer: Self-pay | Admitting: Cardiovascular Disease

## 2018-01-26 ENCOUNTER — Other Ambulatory Visit: Payer: Self-pay | Admitting: Family Medicine

## 2018-01-26 DIAGNOSIS — R0789 Other chest pain: Secondary | ICD-10-CM

## 2018-01-26 DIAGNOSIS — I5022 Chronic systolic (congestive) heart failure: Secondary | ICD-10-CM

## 2018-01-30 DIAGNOSIS — J449 Chronic obstructive pulmonary disease, unspecified: Secondary | ICD-10-CM

## 2018-01-30 NOTE — Progress Notes (Signed)
Pulmonary Individual Treatment Plan  Patient Details  Name: Leslie Sims MRN: [id number] Date of Birth: May 15, 1951 Referring Provider:     Pulmonary Rehab from 01/24/2018 in [facility] Cardiac and Pulmonary Rehab  Referring Provider  [name]      Initial Encounter Date:    Pulmonary Rehab from 01/24/2018 in [facility] Cardiac and Pulmonary Rehab  Date  01/24/18  Referring Provider  [name]      Visit Diagnosis: Chronic obstructive pulmonary disease, unspecified COPD type ([place])  Patient's Home Medications on Admission:  Current Outpatient Medications:  .  albuterol (VENTOLIN HFA) 108 (90 Base) MCG/ACT inhaler, USE 2 INHALATIONS EVERY 6 HOURS AS NEEDED FOR WHEEZING, Disp: 48 g, Rfl: 3 .  amoxicillin-clavulanate (AUGMENTIN) 875-125 MG tablet, Take 1 tablet by mouth every 12 (twelve) hours. (Patient not taking: Reported on 01/24/2018), Disp: 4 tablet, Rfl: 0 .  aspirin 81 MG tablet, Take 81 mg by mouth daily.  , Disp: , Rfl:  .  atorvastatin (LIPITOR) 40 MG tablet, Take 1 tablet (40 mg total) by mouth daily., Disp: 90 tablet, Rfl: 3 .  doxycycline (VIBRAMYCIN) 100 MG capsule, Take 1 capsule (100 mg total) by mouth 2 (two) times daily. (Patient not taking: Reported on 01/24/2018), Disp: 20 capsule, Rfl: 0 .  Fluticasone-Salmeterol (ADVAIR DISKUS) 500-50 MCG/DOSE AEPB, Inhale 1 puff into the lungs 2 (two) times daily., Disp: 180 each, Rfl: 3 .  furosemide (LASIX) 20 MG tablet, Take 1 tablet (20 mg total) by mouth daily., Disp: 30 tablet, Rfl: 0 .  furosemide (LASIX) 20 MG tablet, TAKE 1 TABLET DAILY, Disp: 90 tablet, Rfl: 1 .  ipratropium-albuterol (DUONEB) 0.5-2.5 (3) MG/3ML SOLN, Take 3 mLs by nebulization every 6 (six) hours as needed., Disp: 225 mL, Rfl: 3 .  ketoconazole (NIZORAL) 2 % cream, Apply 1 application topically 2 (two) times daily., Disp: 60 g, Rfl: 0 .  levalbuterol (XOPENEX) 1.25 MG/3ML nebulizer solution, Inhale into the lungs., Disp: , Rfl:  .  LORazepam (ATIVAN) 1 MG tablet, TAKE  1/2 TABLET BY MOUTH EVERY 6 HOURS AS NEEDED FOR ANXIETY, Disp: 60 tablet, Rfl: 1 .  losartan (COZAAR) 25 MG tablet, TAKE 1 TABLET DAILY, Disp: 90 tablet, Rfl: 3 .  mirtazapine (REMERON) 15 MG tablet, Take 1 tablet (15 mg total) by mouth at bedtime., Disp: 30 tablet, Rfl: 5 .  Multiple Vitamin (MULTIVITAMIN) capsule, Take 1 capsule by mouth daily., Disp: , Rfl:  .  nitroGLYCERIN (NITROSTAT) 0.4 MG SL tablet, Place 1 tablet (0.4 mg total) under the tongue every 5 (five) minutes as needed., Disp: 25 tablet, Rfl: 3 .  predniSONE (DELTASONE) 20 MG tablet, Take 1 tablet (20 mg total) by mouth daily with breakfast. (Patient not taking: Reported on 01/24/2018), Disp: 10 tablet, Rfl: 0 .  predniSONE (STERAPRED UNI-PAK 21 TAB) 10 MG (21) TBPK tablet, Start 60 mg po daily, taper 10 mg once daily until done (Patient not taking: Reported on 01/24/2018), Disp: 21 tablet, Rfl: 0 .  sertraline (ZOLOFT) 100 MG tablet, Take 1 tablet (100 mg total) by mouth at bedtime., Disp: 90 tablet, Rfl: 1 .  sucralfate (CARAFATE) 1 GM/10ML suspension, Take 10 mLs (1 g total) by mouth 4 (four) times daily -  with meals and at bedtime., Disp: 420 mL, Rfl: 4 .  tiotropium (SPIRIVA HANDIHALER) 18 MCG inhalation capsule, Place 1 capsule (18 mcg total) into inhaler and inhale daily., Disp: 90 capsule, Rfl: 3  Past Medical History: Past Medical History:  Diagnosis Date  . Allergic rhinitis   .  Anxiety   . Atherosclerosis   . Back pain   . Bronchitis    09-14-11  . CARDIOMYOPATHY 03/24/2010   did not tolerate Toprol and Lisinopril.   . Chest pain   . CHF (congestive heart failure) ([place])   . Chronic airway obstruction, not elsewhere classified   . Colon polyps 08/2012   colonoscopy; multiple colon polyps; repeat colonoscopy in one year.  [name] COPD (chronic obstructive pulmonary disease) ([place])   . Coronary artery disease 11/2009   Anterior MI with late presentation 11/2009. Cath showed a 100% proximal LAD occlusion, 99% mid RCA. Had a PCI  and 2 DES to both LAD and RCA. EF was 35%. Nuclear stress test 05/13: prior anterior/inferior infarcts without ischemic, EF 43%, cardiac cath in 02/14: Widely patent stents with no other obstructive disease. EF 40%   . Depression   . Diabetes mellitus without complication ([place])   . GERD (gastroesophageal reflux disease)   . Headache(784.0)   . Helicobacter pylori (H. pylori)   . Hemorrhoids   . Hernia    inguinal  . Hyperlipidemia   . Hypertension   . Insomnia   . MI (myocardial infarction) ([place])   . Myalgia   . Peptic ulcer   . Shortness of breath dyspnea   . Status post dilation of esophageal narrowing 2000  . Thyroid dysfunction   . Tinea pedis   . Wears dentures    partial upper    Tobacco Use: Social History   Tobacco Use  Smoking Status Current Every Day Smoker  . Packs/day: 1.00  . Years: 42.00  . Pack years: 42.00  . Types: Cigarettes  Smokeless Tobacco Never Used  Tobacco Comment   01/24/2018 states smoking less than a pack a day. Wants to quit.  Has contact number for smoking cessation counselor    Labs: Recent Review Flowsheet Data    Labs for ITP Cardiac and Pulmonary Rehab Latest Ref Rng & Units 03/01/2017 06/17/2017 07/27/2017 12/23/2017 12/25/2017   Cholestrol 100 - 199 mg/dL 112 - 143 - -   LDLCALC 0 - 99 mg/dL 47 - 67 - -   HDL >39 mg/dL 33(L) - 62 - -   Trlycerides 0 - 149 mg/dL 158(H) - 72 - -   Hemoglobin A1c 4.8 - 5.6 % 5.3 5.8(H) - - -   HCO3 20.0 - 28.0 mmol/L - - - 37.2(H) 41.4(H)   O2SAT % - - - 87.4 81.6       Pulmonary Assessment Scores: Pulmonary Assessment Scores    Row Name 01/24/18 1247         ADL UCSD   ADL Phase  Entry New Entry numbers 01/24/2018     SOB Score total  86     Rest  2     Walk  4     Stairs  5     Bath  3     Dress  4     Shop  5       CAT Score   CAT Score  28       mMRC Score   mMRC Score  1        Pulmonary Function Assessment: Pulmonary Function Assessment - 01/24/18 1246      Pulmonary Function  Tests   FVC%  72 % date performed 11/01/17    FEV1%  27 %    FEV1/FVC Ratio  30      Breath   Shortness of Breath  Yes;Limiting activity;Fear  of Shortness of Breath       Exercise Target Goals:    Exercise Program Goal: Individual exercise prescription set using results from initial 6 min walk test and THRR while considering  patient's activity barriers and safety.    Exercise Prescription Goal: Initial exercise prescription builds to 30-45 minutes a day of aerobic activity, 2-3 days per week.  Home exercise guidelines will be given to patient during program as part of exercise prescription that the participant will acknowledge.  Activity Barriers & Risk Stratification:   6 Minute Walk: 6 Minute Walk    Row Name 01/24/18 1258         6 Minute Walk   Distance  1000 feet     Walk Time  4.75 minutes     # of Rest Breaks  1     MPH  2.4     METS  3.4     RPE  11     Perceived Dyspnea   2     VO2 Peak  11.9     Symptoms  No     Resting HR  93 bpm     Resting BP  106/72     Resting Oxygen Saturation   95 %     Exercise Oxygen Saturation  during 6 min walk  80 %     Max Ex. HR  123 bpm     Max Ex. BP  132/64     2 Minute Post BP  108/64       Interval HR   1 Minute HR  106     2 Minute HR  104     3 Minute HR  102     4 Minute HR  123     6 Minute HR  120     2 Minute Post HR  97     Interval Heart Rate?  Yes       Interval Oxygen   Interval Oxygen?  Yes     Baseline Oxygen Saturation %  95 %     1 Minute Oxygen Saturation %  82 %     1 Minute Liters of Oxygen  2 L     2 Minute Oxygen Saturation %  82 %     2 Minute Liters of Oxygen  2 L     3 Minute Oxygen Saturation %  81 %     3 Minute Liters of Oxygen  2 L     4 Minute Oxygen Saturation %  83 %     4 Minute Liters of Oxygen  2 L     5 Minute Oxygen Saturation %  81 %     5 Minute Liters of Oxygen  2 L     6 Minute Oxygen Saturation %  80 %     6 Minute Liters of Oxygen  2 L     2 Minute Post Oxygen  Saturation %  95 %     2 Minute Post Liters of Oxygen  2 L       Oxygen Initial Assessment: Oxygen Initial Assessment - 01/24/18 1220      Home Oxygen   Home Oxygen Device  Home Concentrator;E-Tanks    Sleep Oxygen Prescription  Continuous    Liters per minute  2    Home Exercise Oxygen Prescription  Continuous    Liters per minute  2 UNtil sees his pulmonary Doctor again.  This is post discharge form January  2019    Home at Rest Exercise Oxygen Prescription  Continuous    Liters per minute  2    Compliance with Home Oxygen Use  Yes      Initial 6 min Walk   Oxygen Used  Continuous;E-Tanks    Liters per minute  2      Program Oxygen Prescription   Program Oxygen Prescription  Continuous;E-Tanks    Liters per minute  2      Intervention   Short Term Goals  To learn and exhibit compliance with exercise, home and travel O2 prescription;To learn and understand importance of monitoring SPO2 with pulse oximeter and demonstrate accurate use of the pulse oximeter.;To learn and understand importance of maintaining oxygen saturations>88%;To learn and demonstrate proper use of respiratory medications;To learn and demonstrate proper pursed lip breathing techniques or other breathing techniques.    Long  Term Goals  Exhibits compliance with exercise, home and travel O2 prescription;Verbalizes importance of monitoring SPO2 with pulse oximeter and return demonstration;Maintenance of O2 saturations>88%;Exhibits proper breathing techniques, such as pursed lip breathing or other method taught during program session;Compliance with respiratory medication;Demonstrates proper use of MDI's       Oxygen Re-Evaluation:   Oxygen Discharge (Final Oxygen Re-Evaluation):   Initial Exercise Prescription: Initial Exercise Prescription - 01/24/18 1300      Date of Initial Exercise RX and Referring Provider   Date  01/24/18    Referring Provider  [name]      Oxygen   Oxygen  Continuous    Liters  2       Treadmill   MPH  2.4    Grade  1.4    Minutes  15    METs  3.3      Recumbant Bike   Level  1    RPM  60    Watts  25    Minutes  15    METs  3.3      T5 Nustep   Level  2    SPM  80    Minutes  15    METs  3.3      Prescription Details   Frequency (times per week)  3    Duration  Progress to 45 minutes of aerobic exercise without signs/symptoms of physical distress      Intensity   THRR 40-80% of Max Heartrate  118-143    Ratings of Perceived Exertion  11-13    Perceived Dyspnea  0-4      Resistance Training   Training Prescription  Yes    Weight  3 lb    Reps  10-15       Perform Capillary Blood Glucose checks as needed.  Exercise Prescription Changes:   Exercise Comments:   Exercise Goals and Review: Exercise Goals    Row Name 01/24/18 1257             Exercise Goals   Increase Physical Activity  Yes       Intervention  Provide advice, education, support and counseling about physical activity/exercise needs.;Develop an individualized exercise prescription for aerobic and resistive training based on initial evaluation findings, risk stratification, comorbidities and participant's personal goals.       Expected Outcomes  Short Term: Attend rehab on a regular basis to increase amount of physical activity.;Long Term: Add in home exercise to make exercise part of routine and to increase amount of physical activity.;Long Term: Exercising regularly at least 3-5 days a week.  Increase Strength and Stamina  Yes       Intervention  Provide advice, education, support and counseling about physical activity/exercise needs.;Develop an individualized exercise prescription for aerobic and resistive training based on initial evaluation findings, risk stratification, comorbidities and participant's personal goals.       Expected Outcomes  Short Term: Increase workloads from initial exercise prescription for resistance, speed, and METs.;Short Term: Perform  resistance training exercises routinely during rehab and add in resistance training at home;Long Term: Improve cardiorespiratory fitness, muscular endurance and strength as measured by increased METs and functional capacity (6MWT)       Able to understand and use rate of perceived exertion (RPE) scale  Yes       Intervention  Provide education and explanation on how to use RPE scale       Expected Outcomes  Short Term: Able to use RPE daily in rehab to express subjective intensity level;Long Term:  Able to use RPE to guide intensity level when exercising independently       Able to understand and use Dyspnea scale  Yes       Intervention  Provide education and explanation on how to use Dyspnea scale       Expected Outcomes  Short Term: Able to use Dyspnea scale daily in rehab to express subjective sense of shortness of breath during exertion;Long Term: Able to use Dyspnea scale to guide intensity level when exercising independently       Knowledge and understanding of Target Heart Rate Range (THRR)  Yes       Intervention  Provide education and explanation of THRR including how the numbers were predicted and where they are located for reference       Expected Outcomes  Short Term: Able to state/look up THRR;Long Term: Able to use THRR to govern intensity when exercising independently;Short Term: Able to use daily as guideline for intensity in rehab       Able to check pulse independently  Yes       Intervention  Provide education and demonstration on how to check pulse in carotid and radial arteries.;Review the importance of being able to check your own pulse for safety during independent exercise       Expected Outcomes  Short Term: Able to explain why pulse checking is important during independent exercise;Long Term: Able to check pulse independently and accurately       Understanding of Exercise Prescription  Yes       Intervention  Provide education, explanation, and written materials on patient's  individual exercise prescription       Expected Outcomes  Short Term: Able to explain program exercise prescription;Long Term: Able to explain home exercise prescription to exercise independently          Exercise Goals Re-Evaluation :   Discharge Exercise Prescription (Final Exercise Prescription Changes):   Nutrition:  Target Goals: Understanding of nutrition guidelines, daily intake of sodium <1565m, cholesterol <2038m calories 30% from fat and 7% or less from saturated fats, daily to have 5 or more servings of fruits and vegetables.  Biometrics: Pre Biometrics - 01/24/18 1257      Pre Biometrics   Height  5' 9.5" (1.765 m)    Weight  141 lb 12.8 oz (64.3 kg)    Waist Circumference  35 inches    Hip Circumference  37.25 inches    Waist to Hip Ratio  0.94 %    BMI (Calculated)  20.65  Nutrition Therapy Plan and Nutrition Goals: Nutrition Therapy & Goals - 01/24/18 1226      Intervention Plan   Intervention  Prescribe, educate and counsel regarding individualized specific dietary modifications aiming towards targeted core components such as weight, hypertension, lipid management, diabetes, heart failure and other comorbidities.    Expected Outcomes  Short Term Goal: Understand basic principles of dietary content, such as calories, fat, sodium, cholesterol and nutrients.;Short Term Goal: A plan has been developed with personal nutrition goals set during dietitian appointment.;Long Term Goal: Adherence to prescribed nutrition plan.       Nutrition Assessments: Nutrition Assessments - 01/24/18 1224      MEDFICTS Scores   Pre Score  38       Nutrition Goals Re-Evaluation:   Nutrition Goals Discharge (Final Nutrition Goals Re-Evaluation):   Psychosocial: Target Goals: Acknowledge presence or absence of significant depression and/or stress, maximize coping skills, provide positive support system. Participant is able to verbalize types and ability to use  techniques and skills needed for reducing stress and depression.   Initial Review & Psychosocial Screening: Initial Psych Review & Screening - 01/24/18 1251      Initial Review   Comments  Yasseen states that he tries to get out of the house, away from home,at least 30 min every day.   He does have  a CAT Scan due soon for followup of nodules found earlier in his chest.  Biopsy did indicate not cancer.       Family Dynamics   Good Support System?  -- Eldar has his 2 dogs at home that are great companions to him       Quality of Life Scores:  Scores of 19 and below usually indicate a poorer quality of life in these areas.  A difference of  2-3 points is a clinically meaningful difference.  A difference of 2-3 points in the total score of the Quality of Life Index has been associated with significant improvement in overall quality of life, self-image, physical symptoms, and general health in studies assessing change in quality of life.  PHQ-9: Recent Review Flowsheet Data    Depression screen [facility] 2/9 01/24/2018 01/09/2018 11/16/2017 10/05/2017 09/07/2017   Decreased Interest 3 3 2 1 2    Down, Depressed, Hopeless 3 3 2 1 2    PHQ - 2 Score 6 6 4 2 4    Altered sleeping 3 3 1  0 2   Tired, decreased energy 3 3 1 1 2    Change in appetite 3 3 2  0 2   Feeling bad or failure about yourself  1 2 1 1 2    Trouble concentrating 3 2 0 0 2   Moving slowly or fidgety/restless 3 0 0 0 0   Suicidal thoughts 0 0 0 0 0   PHQ-9 Score 22 19 9 4 14    Difficult doing work/chores Extremely dIfficult - - - -     Interpretation of Total Score  Total Score Depression Severity:  1-4 = Minimal depression, 5-9 = Mild depression, 10-14 = Moderate depression, 15-19 = Moderately severe depression, 20-27 = Severe depression   Psychosocial Evaluation and Intervention:   Psychosocial Re-Evaluation:   Psychosocial Discharge (Final Psychosocial Re-Evaluation):   Education: Education Goals: Education classes  will be provided on a weekly basis, covering required topics. Participant will state understanding/return demonstration of topics presented.  Learning Barriers/Preferences: Learning Barriers/Preferences - 01/24/18 1237      Learning Barriers/Preferences   Learning Barriers  None  Learning Preferences  Verbal Instruction;Video       Education Topics:  Initial Evaluation Education: - Verbal, written and demonstration of respiratory meds, oximetry and breathing techniques. Instruction on use of nebulizers and MDIs and importance of monitoring MDI activations.   Pulmonary Rehab from 01/24/2018 in [facility] Cardiac and Pulmonary Rehab  Date  01/24/18  Educator  SB  Instruction Review Code  1- Verbalizes Understanding      General Nutrition Guidelines/Fats and Fiber: -Group instruction provided by verbal, written material, models and posters to present the general guidelines for heart healthy nutrition. Gives an explanation and review of dietary fats and fiber.   Pulmonary Rehab from 04/18/2017 in [facility] Cardiac and Pulmonary Rehab  Date  04/18/17  Educator  CR  Instruction Review Code (retired)  2- meets goals/outcomes      Controlling Sodium/Reading Food Labels: -Group verbal and written material supporting the discussion of sodium use in heart healthy nutrition. Review and explanation with models, verbal and written materials for utilization of the food label.   Exercise Physiology & General Exercise Guidelines: - Group verbal and written instruction with models to review the exercise physiology of the cardiovascular system and associated critical values. Provides general exercise guidelines with specific guidelines to those with heart or lung disease.    Aerobic Exercise & Resistance Training: - Gives group verbal and written instruction on the various components of exercise. Focuses on aerobic and resistive training programs and the benefits of this training and how to safely progress  through these programs.   Flexibility, Balance, Mind/Body Relaxation: Provides group verbal/written instruction on the benefits of flexibility and balance training, including mind/body exercise modes such as yoga, pilates and tai chi.  Demonstration and skill practice provided.   Stress and Anxiety: - Provides group verbal and written instruction about the health risks of elevated stress and causes of high stress.  Discuss the correlation between heart/lung disease and anxiety and treatment options. Review healthy ways to manage with stress and anxiety.   Depression: - Provides group verbal and written instruction on the correlation between heart/lung disease and depressed mood, treatment options, and the stigmas associated with seeking treatment.   Exercise & Equipment Safety: - Individual verbal instruction and demonstration of equipment use and safety with use of the equipment.   Pulmonary Rehab from 01/24/2018 in [facility] Cardiac and Pulmonary Rehab  Date  01/24/18  Educator  Sb  Instruction Review Code  1- Verbalizes Understanding      Infection Prevention: - Provides verbal and written material to individual with discussion of infection control including proper hand washing and proper equipment cleaning during exercise session.   Pulmonary Rehab from 01/24/2018 in [facility] Cardiac and Pulmonary Rehab  Date  01/24/18  Educator  Sb  Instruction Review Code  1- Verbalizes Understanding      Falls Prevention: - Provides verbal and written material to individual with discussion of falls prevention and safety.   Pulmonary Rehab from 01/24/2018 in [facility] Cardiac and Pulmonary Rehab  Date  01/24/18  Educator  SB  Instruction Review Code  1- Verbalizes Understanding      Diabetes: - Individual verbal and written instruction to review signs/symptoms of diabetes, desired ranges of glucose level fasting, after meals and with exercise. Advice that pre and post exercise glucose checks will be done  for 3 sessions at entry of program.   Chronic Lung Diseases: - Group verbal and written instruction to review updates, respiratory medications, advancements in procedures and treatments. Discuss use  of supplemental oxygen including available portable oxygen systems, continuous and intermittent flow rates, concentrators, personal use and safety guidelines. Review proper use of inhaler and spacers. Provide informative websites for self-education.    Energy Conservation: - Provide group verbal and written instruction for methods to conserve energy, plan and organize activities. Instruct on pacing techniques, use of adaptive equipment and posture/positioning to relieve shortness of breath.   Triggers and Exacerbations: - Group verbal and written instruction to review types of environmental triggers and ways to prevent exacerbations. Discuss weather changes, air quality and the benefits of nasal washing. Review warning signs and symptoms to help prevent infections. Discuss techniques for effective airway clearance, coughing, and vibrations.   AED/CPR: - Group verbal and written instruction with the use of models to demonstrate the basic use of the AED with the basic ABC's of resuscitation.   Anatomy and Physiology of the Lungs: - Group verbal and written instruction with the use of models to provide basic lung anatomy and physiology related to function, structure and complications of lung disease.   Anatomy & Physiology of the Heart: - Group verbal and written instruction and models provide basic cardiac anatomy and physiology, with the coronary electrical and arterial systems. Review of Valvular disease and Heart Failure   Cardiac Medications: - Group verbal and written instruction to review commonly prescribed medications for heart disease. Reviews the medication, class of the drug, and side effects.   Know Your Numbers and Risk Factors: -Group verbal and written instruction about  important numbers in your health.  Discussion of what are risk factors and how they play a role in the disease process.  Review of Cholesterol, Blood Pressure, Diabetes, and BMI and the role they play in your overall health.   Sleep Hygiene: -Provides group verbal and written instruction about how sleep can affect your health.  Define sleep hygiene, discuss sleep cycles and impact of sleep habits. Review good sleep hygiene tips.    Other: -Provides group and verbal instruction on various topics (see comments)    Knowledge Questionnaire Score: Knowledge Questionnaire Score - 01/24/18 1237      Knowledge Questionnaire Score   Pre Score  15/18 Reviewed correct responses with [place]. He verbalized understading of the correct response. He had no questions today        Core Components/Risk Factors/Patient Goals at Admission: Personal Goals and Risk Factors at Admission - 01/24/18 1226      Core Components/Risk Factors/Patient Goals on Admission    Weight Management  Yes    Intervention  Weight Management: Develop a combined nutrition and exercise program designed to reach desired caloric intake, while maintaining appropriate intake of nutrient and fiber, sodium and fats, and appropriate energy expenditure required for the weight goal.;Weight Management: Provide education and appropriate resources to help participant work on and attain dietary goals.;Weight Management/Obesity: Establish reasonable short term and long term weight goals.    Admit Weight  141 lb 12.8 oz (64.3 kg)    Goal Weight: Short Term  145 lb (65.8 kg)    Goal Weight: Long Term  145 lb (65.8 kg)    Expected Outcomes  Short Term: Continue to assess and modify interventions until short term weight is achieved;Long Term: Adherence to nutrition and physical activity/exercise program aimed toward attainment of established weight goal;Weight Maintenance: Understanding of the daily nutrition guidelines, which includes 25-35%  calories from fat, 7% or less cal from saturated fats, less than 250m cholesterol, less than 1.5gm of sodium, & 5  or more servings of fruits and vegetables daily;Understanding recommendations for meals to include 15-35% energy as protein, 25-35% energy from fat, 35-60% energy from carbohydrates, less than 235m of dietary cholesterol, 20-35 gm of total fiber daily;Understanding of distribution of calorie intake throughout the day with the consumption of 4-5 meals/snacks    Tobacco Cessation  Yes    Intervention  Assist the participant in steps to quit. Provide individualized education and counseling about committing to Tobacco Cessation, relapse prevention, and pharmacological support that can be provided by physician.;OAdvice worker assist with locating and accessing local/national Quit Smoking programs, and support quit date choice. Given smoking cessation packet. Reviewed need to see the counselor to help him with the smoking cessation journey.    Expected Outcomes  Short Term: Will demonstrate readiness to quit, by selecting a quit date.;Long Term: Complete abstinence from all tobacco products for at least 12 months from quit date.;Short Term: Will quit all tobacco product use, adhering to prevention of relapse plan.    Improve shortness of breath with ADL's  Yes    Intervention  Provide education, individualized exercise plan and daily activity instruction to help decrease symptoms of SOB with activities of daily living.    Expected Outcomes  Short Term: Improve cardiorespiratory fitness to achieve a reduction of symptoms when performing ADLs;Long Term: Be able to perform more ADLs without symptoms or delay the onset of symptoms    Heart Failure  Yes    Intervention  Provide a combined exercise and nutrition program that is supplemented with education, support and counseling about heart failure. Directed toward relieving symptoms such as shortness of breath, decreased exercise  tolerance, and extremity edema.    Expected Outcomes  Improve functional capacity of life;Short term: Attendance in program 2-3 days a week with increased exercise capacity. Reported lower sodium intake. Reported increased fruit and vegetable intake. Reports medication compliance.;Short term: Daily weights obtained and reported for increase. Utilizing diuretic protocols set by physician.;Long term: Adoption of self-care skills and reduction of barriers for early signs and symptoms recognition and intervention leading to self-care maintenance.    Hypertension  Yes    Intervention  Provide education on lifestyle modifcations including regular physical activity/exercise, weight management, moderate sodium restriction and increased consumption of fresh fruit, vegetables, and low fat dairy, alcohol moderation, and smoking cessation.;Monitor prescription use compliance.    Expected Outcomes  Short Term: Continued assessment and intervention until BP is < 140/977mHG in hypertensive participants. < 130/8052mG in hypertensive participants with diabetes, heart failure or chronic kidney disease.;Long Term: Maintenance of blood pressure at goal levels.    Lipids  Yes    Intervention  Provide education and support for participant on nutrition & aerobic/resistive exercise along with prescribed medications to achieve LDL <24m73mDL >40mg24m Expected Outcomes  Short Term: Participant states understanding of desired cholesterol values and is compliant with medications prescribed. Participant is following exercise prescription and nutrition guidelines.;Long Term: Cholesterol controlled with medications as prescribed, with individualized exercise RX and with personalized nutrition plan. Value goals: LDL < 24mg,34m > 40 mg.    Stress  Yes    Intervention  Offer individual and/or small group education and counseling on adjustment to heart disease, stress management and health-related lifestyle change. Teach and support  self-help strategies.;Refer participants experiencing significant psychosocial distress to appropriate mental health specialists for further evaluation and treatment. When possible, include family members and significant others in education/counseling sessions. Loss of three family members in  the past year. One being his wife of 45 years.  Continues to grieve for his wife.Is attending grief counseling program.     Expected Outcomes  Short Term: Participant demonstrates changes in health-related behavior, relaxation and other stress management skills, ability to obtain effective social support, and compliance with psychotropic medications if prescribed.;Long Term: Emotional wellbeing is indicated by absence of clinically significant psychosocial distress or social isolation.       Core Components/Risk Factors/Patient Goals Review:    Core Components/Risk Factors/Patient Goals at Discharge (Final Review):    ITP Comments: ITP Comments    Row Name 01/24/18 1218 01/30/18 0936         ITP Comments  Medical review completed today. ITP to Dr Sabra Heck for review, changes as needed and signature.   Documentation of diagnosis can be found in [facility] Encounter 12/25/2017.  New admission. Did not complete program last year, because his wife became very sick and died in 2023-08-12.   30 day review completed. ITP sent to Dr. Emily Filbert Director of [place]. Continue with ITP unless changes are made by physician.         Comments: 30 day review

## 2018-02-01 ENCOUNTER — Ambulatory Visit
Admission: RE | Admit: 2018-02-01 | Discharge: 2018-02-01 | Disposition: A | Discharge status: Home / Self Care | Payer: BLUE CROSS/BLUE SHIELD | Source: Ambulatory Visit | Attending: Oncology | Admitting: Oncology

## 2018-02-01 DIAGNOSIS — J439 Emphysema, unspecified: Secondary | ICD-10-CM | POA: Insufficient documentation

## 2018-02-01 DIAGNOSIS — R911 Solitary pulmonary nodule: Secondary | ICD-10-CM | POA: Insufficient documentation

## 2018-02-01 DIAGNOSIS — I700 Atherosclerosis of aorta: Secondary | ICD-10-CM | POA: Diagnosis not present

## 2018-02-01 DIAGNOSIS — I2510 Atherosclerotic heart disease of native coronary artery without angina pectoris: Secondary | ICD-10-CM | POA: Diagnosis not present

## 2018-02-01 IMAGING — CT CT CHEST W/ CM
2 of 4 series · 15 of 36 positions shown, 18 images · IV contrast (iopamidol)
Comparison: [DATE]

CLINICAL DATA: Followup CT angio chest. Follow-up pulmonary
nodules.

EXAM:
CT CHEST WITH CONTRAST
TECHNIQUE: Multidetector CT imaging of the chest was performed during
intravenous contrast administration.
CONTRAST:  75mL [KX] IOPAMIDOL ([KX]) INJECTION 61%

[Series 2: thorax 2.00 br36 s3 · axial · 0.71mm/px · z∈[-1449,-1141]mm · 12 of 183 slices shown, 15 images]
[im 15/183  mediastinal]
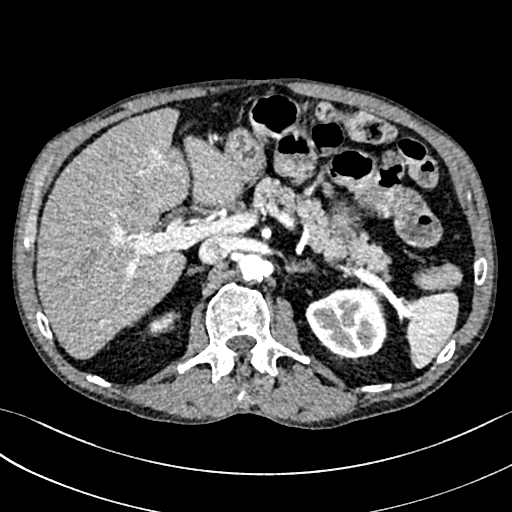
[im 15/183  lung]
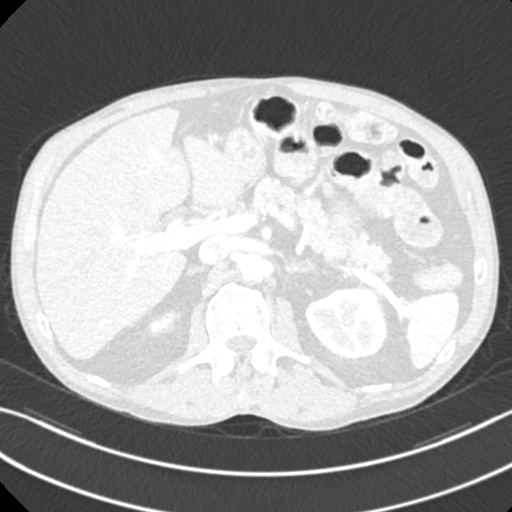
[im 29/183  lung]
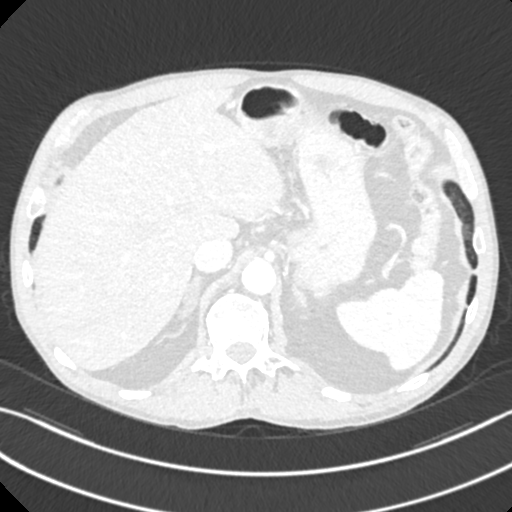
[im 43/183  lung]
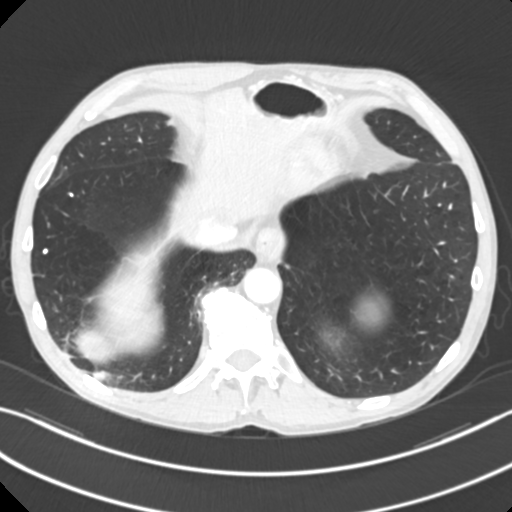
[im 57/183  lung]
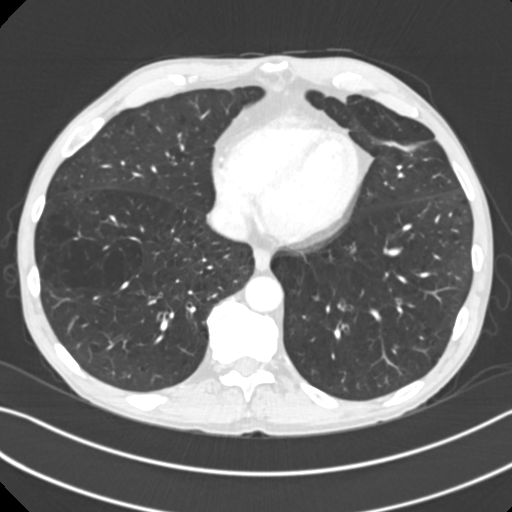
[im 71/183  mediastinal]
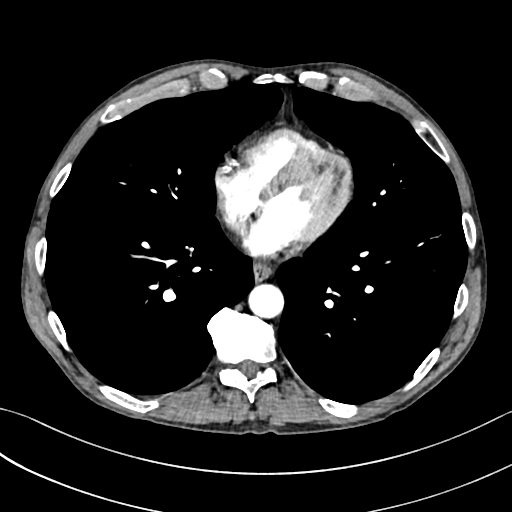
[im 71/183  lung]
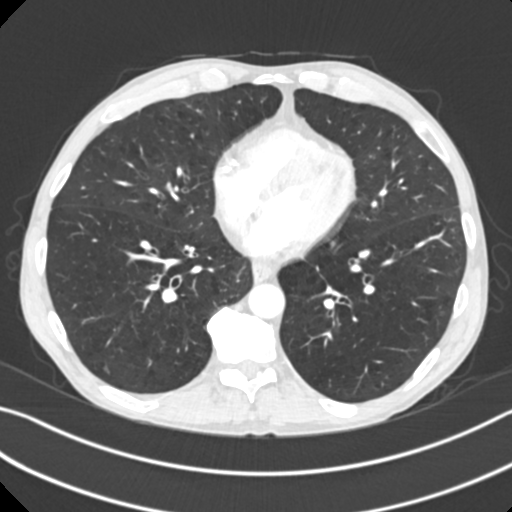
[im 85/183  lung]
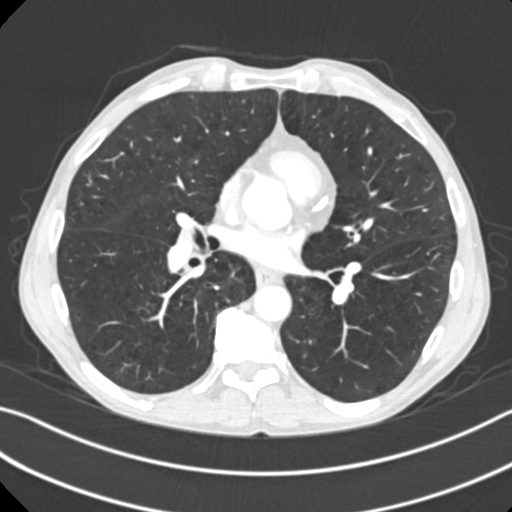
[im 99/183  lung]
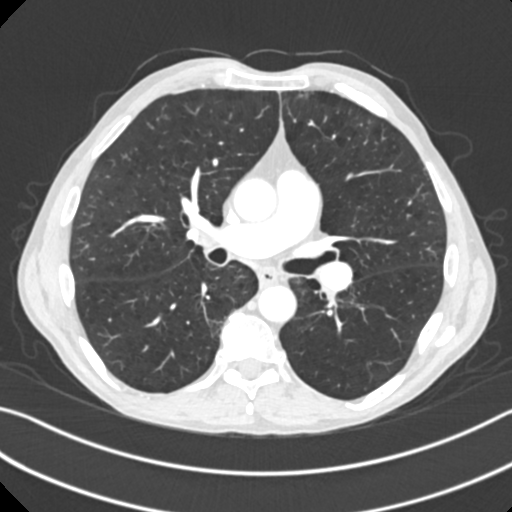
[im 113/183  lung]
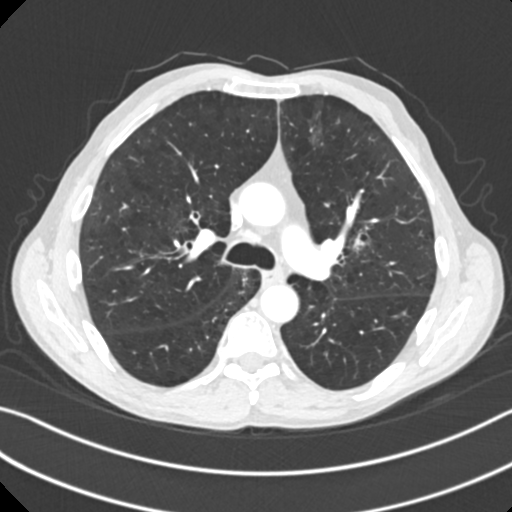
[im 127/183  mediastinal]
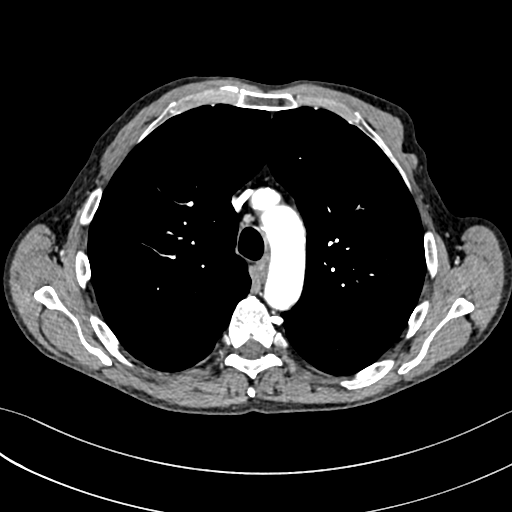
[im 127/183  lung]
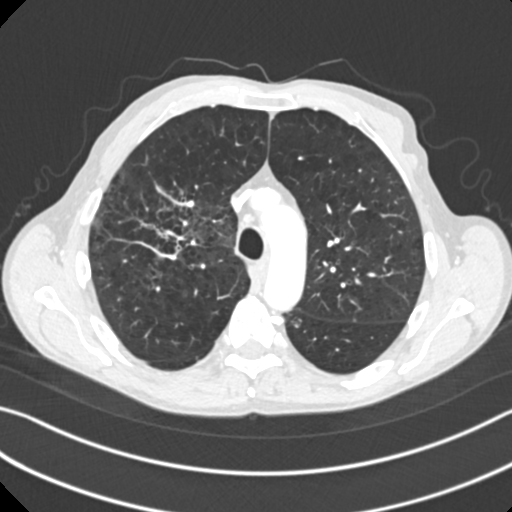
[im 141/183  lung]
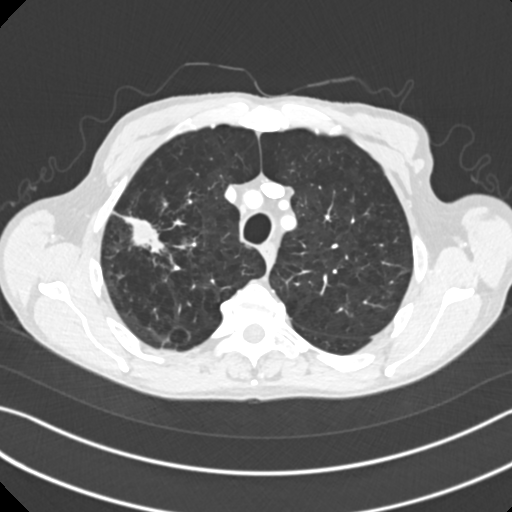
[im 155/183  lung]
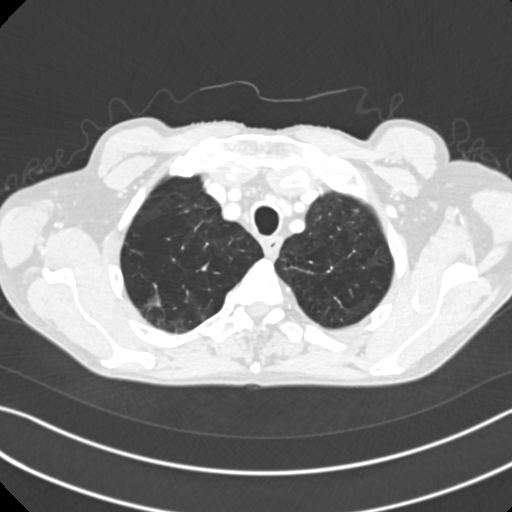
[im 169/183  lung]
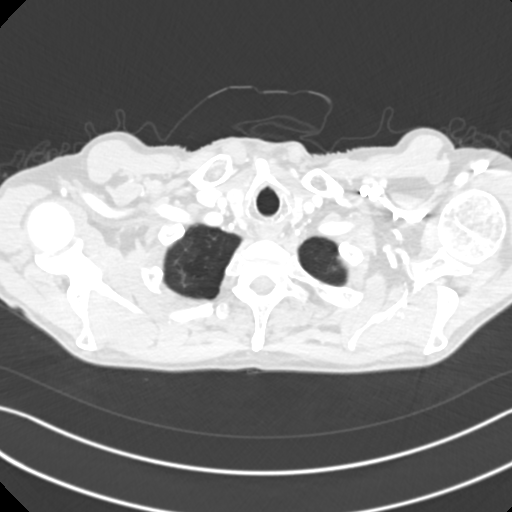

[Series 5: thorax 2.00 br36 s3 cor · coronal · 0.72mm/px · 3 of 142 slices shown]
[im 29/142  lung]
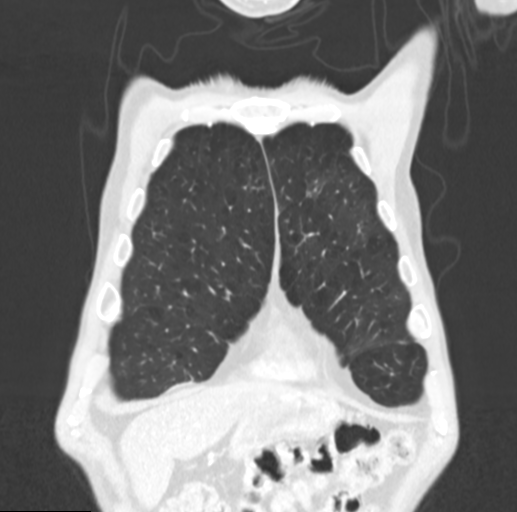
[im 57/142  lung]
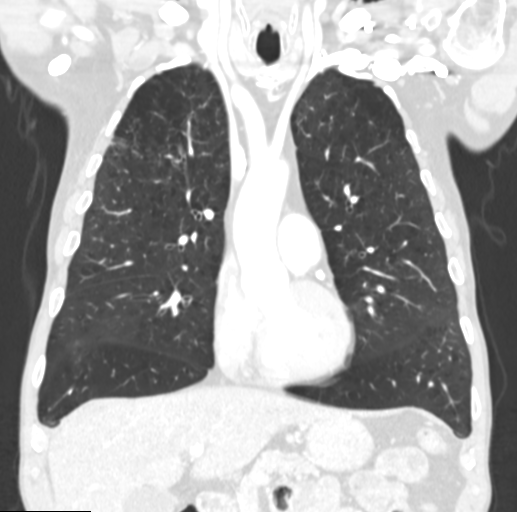
[im 85/142  lung]
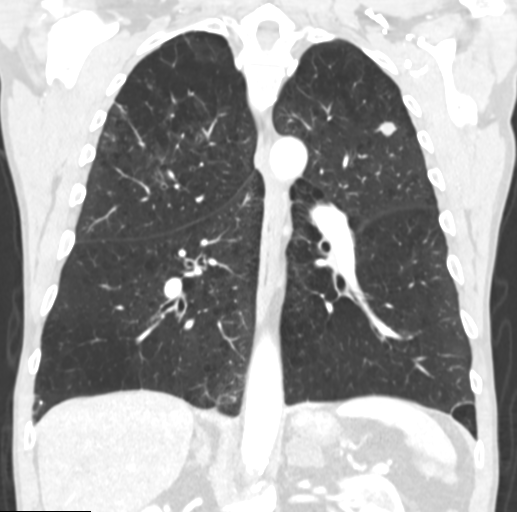

[15 of 36 positions shown; findings below may reference images not displayed]

FINDINGS: Cardiovascular: The heart size is normal. No pericardial effusion
identified. Aortic atherosclerosis. Lad and RCA coronary artery
calcifications noted.

Mediastinum/Nodes: The trachea appears patent and midline. Normal
appearance of the esophagus. Normal appearance of the thyroid gland.
No supraclavicular or axillary adenopathy. No mediastinal or hilar
adenopathy identified. No subcarinal adenopathy.

Lungs/Pleura: Advanced changes of emphysema. Left upper lobe solid
nodule measures 1 x 1.4 x 1.0 cm (volume = 0.7 cm^3), image 46 of
series 3. On the previous exam this measured 1.7 x 1.3 by 1.1 cm.
(Volume = 1.3 cm^3).

There is new masslike architectural distortion and ground-glass
attenuation surrounding the identified right upper lobe pulmonary
nodule. This partially obscures the index nodule. On today's study
the nodule measures approximately 1.8 cm versus 1.4 cm previously,
image 43 of series 3. Subsegmental atelectasis identified within the
right base.

Upper Abdomen: No acute abnormality. Bilateral adrenal gland
adenomas are again noted. Calcified granuloma is identified within
the spleen.

Musculoskeletal: Spondylosis within the thoracic spine. No
suspicious bone lesions identified.
IMPRESSION: 1. There is new masslike architectural distortion and ground-glass
attenuation surrounding the index nodule within the right upper
lobe. The appearance favors an inflammatory or infectious process.
Underlying tumor is considered less favored but not excluded and
follow-up imaging with a repeat CT of the chest following
appropriate antibiotic therapy is recommended in 3-4 weeks following
trial of antibiotic therapy to ensure resolution and exclude
underlying malignancy.
2. The index nodule in the left upper lobe is slightly decreased in
volume when compared with the previous exam favoring a benign
process. Attention on follow-up imaging advise.
3. Advanced changes of emphysema.
4. Aortic atherosclerosis. Calcifications in the LAD and RCA
coronary artery noted.

Aortic Atherosclerosis ([KX]-[KX]) and Emphysema ([KX]-[KX]).

## 2018-02-01 MED ORDER — IOPAMIDOL (ISOVUE-300) INJECTION 61%
75.0000 mL | Freq: Once | INTRAVENOUS | Status: AC | PRN
  Administered 2018-02-01: 75 mL via INTRAVENOUS

## 2018-02-03 DIAGNOSIS — J449 Chronic obstructive pulmonary disease, unspecified: Secondary | ICD-10-CM

## 2018-02-03 NOTE — Progress Notes (Signed)
Daily Session Note  Patient Details  Name: Leslie Sims MRN: [id number] Date of Birth: 07/19/1951 Referring Provider:     Pulmonary Rehab from 01/24/2018 in [facility]  Referring Provider  [name]      Encounter Date: 02/03/2018  Check In: Session Check In - 02/03/18 1134      Check-In   Location  [facility]-Cardiac & Pulmonary Rehab    Staff Present  Justin Mend RCP,RRT,BSRT;Meredith Sherryll Burger, RN BSN;Jessica Luan Pulling, MA, RCEP, CCRP, Exercise Physiologist    Supervising physician immediately available to respond to emergencies  [facility] immediately available ER MD    Physician(s)  Dr. Jacqualine Code and Quentin Cornwall    Medication changes reported      No    Fall or balance concerns reported     No    Tobacco Cessation  No Change    Warm-up and Cool-down  Performed as group-led instruction    Resistance Training Performed  Yes    VAD Patient?  No      Pain Assessment   Currently in Pain?  No/denies          Social History   Tobacco Use  Smoking Status Current Every Day Smoker  . Packs/day: 1.00  . Years: 42.00  . Pack years: 42.00  . Types: Cigarettes  Smokeless Tobacco Never Used  Tobacco Comment   01/24/2018 states smoking less than a pack a day. Wants to quit.  Has contact number for smoking cessation counselor    Goals Met:  Independence with exercise equipment Exercise tolerated well No report of cardiac concerns or symptoms Strength training completed today  Goals Unmet:  Not Applicable  Comments: First full day of exercise!  Patient was oriented to gym and equipment including functions, settings, policies, and procedures.  Patient's individual exercise prescription and treatment plan were reviewed.  All starting workloads were established based on the results of the 6 minute walk test done at initial orientation visit.  The plan for exercise progression was also introduced and progression will be customized based on patient's performance and  goals.   Dr. Emily Filbert is Medical Director for [facility] and [facility].

## 2018-02-05 NOTE — Progress Notes (Signed)
[place]  [phone] [phone]) [phone]  ID: Leslie Sims [place] OB: 28-Oct-1951  MR#: [id number]  MEQ#:[id number]  Patient Care Team: [name], MD as PCP - General (Family Medicine) [name], MD (Family Medicine) Wellington Hampshire, MD as Consulting Physician (Cardiology) Telford Nab, RN as Registered Nurse  CHIEF COMPLAINT: Bilateral pulmonary nodules.  INTERVAL HISTORY: Patient returns to clinic today for further evaluation and discussion of his imaging results.  He continues to be anxious, but otherwise feels well.  He recently finished a course of antibiotics approximately 2 weeks ago.  He continues to smoke heavily, but states he is trying to quit.  He also has chronic shortness of breath and uses oxygen mainly at night.  He has no neurologic complaints.  He has a good appetite and denies weight loss.  He denies any recent fevers. He denies any chest pain, cough, or hemoptysis.  He has no nausea, vomiting, constipation, or diarrhea.  No urinary complaints.  Patient offers no further specific complaints today.  REVIEW OF SYSTEMS:   Review of Systems  Constitutional: Negative.  Negative for fever, malaise/fatigue and weight loss.  Respiratory: Positive for shortness of breath. Negative for cough and hemoptysis.   Cardiovascular: Negative.  Negative for chest pain and leg swelling.  Gastrointestinal: Negative.  Negative for abdominal pain, blood in stool and melena.  Genitourinary: Negative.   Musculoskeletal: Negative.  Negative for joint pain.  Skin: Negative.  Negative for rash.  Neurological: Negative.  Negative for sensory change and weakness.  Psychiatric/Behavioral: The patient is nervous/anxious.     As per HPI. Otherwise, a complete review of systems is negative.  PAST MEDICAL HISTORY: Past Medical History:  Diagnosis Date  . Allergic rhinitis   . Anxiety   . Atherosclerosis   . Back pain   . Bronchitis    09-14-11    . CARDIOMYOPATHY 03/24/2010   did not tolerate Toprol and Lisinopril.   . Chest pain   . CHF (congestive heart failure) ([place])   . Chronic airway obstruction, not elsewhere classified   . Colon polyps 08/2012   colonoscopy; multiple colon polyps; repeat colonoscopy in one year.  [name] COPD (chronic obstructive pulmonary disease) ([place])   . Coronary artery disease 11/2009   Anterior MI with late presentation 11/2009. Cath showed a 100% proximal LAD occlusion, 99% mid RCA. Had a PCI and 2 DES to both LAD and RCA. EF was 35%. Nuclear stress test 05/13: prior anterior/inferior infarcts without ischemic, EF 43%, cardiac cath in 02/14: Widely patent stents with no other obstructive disease. EF 40%   . Depression   . GERD (gastroesophageal reflux disease)   . Headache(784.0)   . Helicobacter pylori (H. pylori)   . Hemorrhoids   . Hernia    inguinal  . Hyperlipidemia   . Hypertension   . Insomnia   . MI (myocardial infarction) ([place])   . Myalgia   . Peptic ulcer   . Shortness of breath dyspnea   . Status post dilation of esophageal narrowing 2000  . Thyroid dysfunction   . Tinea pedis   . Wears dentures    partial upper    PAST SURGICAL HISTORY: Past Surgical History:  Procedure Laterality Date  . Admission  12/20/2012   COPD exacerbation.  [place].  [name] CARDIAC CATHETERIZATION    . CARDIAC CATHETERIZATION  02/05/13   [facility]  . CARDIAC CATHETERIZATION  10/14   [facility] : patent stents with no change in anatomy. EF: 40$  .  CARDIAC CATHETERIZATION Left 11/12/2016   Procedure: Left Heart Cath and Coronary Angiography;  Surgeon: Wellington Hampshire, MD;  Location: [facility];  Service: Cardiovascular;  Laterality: Left;  . COLONOSCOPY    . COLONOSCOPY WITH PROPOFOL N/A 05/18/2016   Procedure: COLONOSCOPY WITH PROPOFOL;  Surgeon: Lucilla Lame, MD;  Location: [facility] ENDOSCOPY;  Service: Endoscopy;  Laterality: N/A;  . CORONARY ANGIOPLASTY WITH STENT PLACEMENT  09/2009   LAD 3.0 X23 mm Xience DES, RCA:  4.0 X 15 mm Xience DES  . ELECTROMAGNETIC NAVIGATION BROCHOSCOPY N/A 10/27/2017   Procedure: ELECTROMAGNETIC NAVIGATION BRONCHOSCOPY;  Surgeon: Flora Lipps, MD;  Location: [facility] ORS;  Service: Cardiopulmonary;  Laterality: N/A;  . ESOPHAGEAL DILATION    . ESOPHAGOGASTRODUODENOSCOPY  2008  . ESOPHAGOGASTRODUODENOSCOPY  08/20/2012  . HERNIA REPAIR  07/20/2012   L inguinal hernia repair  . PENILE PROSTHESIS IMPLANT      FAMILY HISTORY: Family History  Problem Relation Age of Onset  . Heart attack Brother        Brother #1  . Diabetes Brother   . Hypertension Brother        #3  . Coronary artery disease Father 108       deceased  . Heart attack Father   . Diabetes Father   . Heart disease Father   . COPD Mother 72       deceased  . Alcohol abuse Sister        polysubstance abuse  . COPD Sister   . Lung cancer Sister   . Alcohol abuse Sister        polysubstance abuse  . Penile cancer Brother   . Diabetes Brother     ADVANCED DIRECTIVES (Y/N):  N  HEALTH MAINTENANCE: Social History   Tobacco Use  . Smoking status: Current Every Day Smoker    Packs/day: 1.00    Years: 42.00    Pack years: 42.00    Types: Cigarettes  . Smokeless tobacco: Never Used  . Tobacco comment: 01/24/2018 states smoking less than a pack a day. Wants to quit.  Has contact number for smoking cessation counselor  Substance Use Topics  . Alcohol use: No    Alcohol/week: 0.0 oz  . Drug use: No     Colonoscopy:  PAP:  Bone density:  Lipid panel:  Allergies  Allergen Reactions  . Effexor Xr [Venlafaxine Hcl Er]   . Wellbutrin [Bupropion] Other (See Comments)    Unknown/"made me feel real funny"    Current Outpatient Medications  Medication Sig Dispense Refill  . albuterol (VENTOLIN HFA) 108 (90 Base) MCG/ACT inhaler USE 2 INHALATIONS EVERY 6 HOURS AS NEEDED FOR WHEEZING 48 g 3  . aspirin 81 MG tablet Take 81 mg by mouth daily.      [name] atorvastatin (LIPITOR) 40 MG tablet Take 1 tablet (40 mg  total) by mouth daily. 90 tablet 3  . doxycycline (VIBRAMYCIN) 100 MG capsule Take 1 capsule (100 mg total) by mouth 2 (two) times daily. 20 capsule 0  . Fluticasone-Salmeterol (ADVAIR DISKUS) 500-50 MCG/DOSE AEPB Inhale 1 puff into the lungs 2 (two) times daily. 180 each 3  . furosemide (LASIX) 20 MG tablet TAKE 1 TABLET DAILY 90 tablet 1  . ipratropium-albuterol (DUONEB) 0.5-2.5 (3) MG/3ML SOLN Take 3 mLs by nebulization every 6 (six) hours as needed. 225 mL 3  . ketoconazole (NIZORAL) 2 % cream Apply 1 application topically 2 (two) times daily. 60 g 0  . LORazepam (ATIVAN) 1 MG tablet  TAKE 1/2 TABLET BY MOUTH EVERY 6 HOURS AS NEEDED FOR ANXIETY 60 tablet 1  . losartan (COZAAR) 25 MG tablet TAKE 1 TABLET DAILY 90 tablet 3  . mirtazapine (REMERON) 15 MG tablet Take 1 tablet (15 mg total) by mouth at bedtime. 30 tablet 5  . Multiple Vitamin (MULTIVITAMIN) capsule Take 1 capsule by mouth daily.    . nitroGLYCERIN (NITROSTAT) 0.4 MG SL tablet Place 1 tablet (0.4 mg total) under the tongue every 5 (five) minutes as needed. 25 tablet 3  . sertraline (ZOLOFT) 100 MG tablet Take 1 tablet (100 mg total) by mouth at bedtime. 90 tablet 1  . sucralfate (CARAFATE) 1 GM/10ML suspension Take 10 mLs (1 g total) by mouth 4 (four) times daily -  with meals and at bedtime. 420 mL 4  . tiotropium (SPIRIVA HANDIHALER) 18 MCG inhalation capsule Place 1 capsule (18 mcg total) into inhaler and inhale daily. 90 capsule 3  . furosemide (LASIX) 20 MG tablet Take 1 tablet (20 mg total) by mouth daily. 30 tablet 0   No current facility-administered medications for this visit.     OBJECTIVE: Vitals:   02/06/18 1152  BP: 110/75  Pulse: (!) 102  Resp: 20  Temp: (!) 97.3 F (36.3 C)     Body mass index is 20.31 kg/m.    ECOG FS:0 - Asymptomatic  General: Well-developed, well-nourished, no acute distress. Eyes: Pink conjunctiva, anicteric sclera. Lungs: Clear to auscultation bilaterally. Heart: Regular rate and  rhythm. No rubs, murmurs, or gallops. Abdomen: Soft, nontender, nondistended. No organomegaly noted, normoactive bowel sounds. Musculoskeletal: No edema, cyanosis, or clubbing. Neuro: Alert, answering all questions appropriately. Cranial nerves grossly intact. Skin: No rashes or petechiae noted. Psych: Normal affect.   LAB RESULTS:  Lab Results  Component Value Date   NA 140 12/29/2017   K 4.4 12/29/2017   CL 96 (L) 12/29/2017   CO2 36 (H) 12/29/2017   GLUCOSE 118 (H) 12/29/2017   BUN 23 (H) 12/29/2017   CREATININE 0.65 12/29/2017   CALCIUM 9.2 12/29/2017   PROT 7.1 12/25/2017   ALBUMIN 3.9 12/25/2017   AST 22 12/25/2017   ALT 29 12/25/2017   ALKPHOS 66 12/25/2017   BILITOT 0.7 12/25/2017   GFRNONAA >60 12/29/2017   GFRAA >60 12/29/2017    Lab Results  Component Value Date   WBC 11.0 (H) 12/29/2017   NEUTROABS 4.7 12/25/2017   HGB 13.1 12/29/2017   HCT 40.4 12/29/2017   MCV 91.1 12/29/2017   PLT 243 12/29/2017     STUDIES: Ct Chest W Contrast  Result Date: 02/01/2018 CLINICAL DATA:  Followup CT angio chest. Follow-up pulmonary nodules. EXAM: CT CHEST WITH CONTRAST TECHNIQUE: Multidetector CT imaging of the chest was performed during intravenous contrast administration. CONTRAST:  50mL ISOVUE-300 IOPAMIDOL (ISOVUE-300) INJECTION 61% COMPARISON:  12/07/2017 FINDINGS: Cardiovascular: The heart size is normal. No pericardial effusion identified. Aortic atherosclerosis. Lad and RCA coronary artery calcifications noted. Mediastinum/Nodes: The trachea appears patent and midline. Normal appearance of the esophagus. Normal appearance of the thyroid gland. No supraclavicular or axillary adenopathy. No mediastinal or hilar adenopathy identified. No subcarinal adenopathy. Lungs/Pleura: Advanced changes of emphysema. Left upper lobe solid nodule measures 1 x 1.4 x 1.0 cm (volume = 0.7 cm^3), image 46 of series 3. On the previous exam this measured 1.7 x 1.3 by 1.1 cm. (Volume = 1.3  cm^3). There is new masslike architectural distortion and ground-glass attenuation surrounding the identified right upper lobe pulmonary nodule. This partially obscures the index  nodule. On today's study the nodule measures approximately 1.8 cm versus 1.4 cm previously, image 43 of series 3. Subsegmental atelectasis identified within the right base. Upper Abdomen: No acute abnormality. 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 Calcified granuloma is identified within the spleen. Musculoskeletal: Spondylosis within the thoracic spine. No suspicious bone lesions identified. IMPRESSION: 1. There is new masslike architectural distortion and ground-glass attenuation surrounding the index nodule within the right upper lobe. The appearance favors an inflammatory or infectious process. Underlying tumor is considered less favored but not excluded and follow-up imaging with a repeat CT of the chest following appropriate antibiotic therapy is recommended in 3-4 weeks following trial of antibiotic therapy to ensure resolution and exclude underlying malignancy. 2. The index nodule in the left upper lobe is slightly decreased in volume when compared with the previous exam favoring a benign process. Attention on follow-up imaging advise. 3. Advanced changes of emphysema. 4. Aortic atherosclerosis. Calcifications in the LAD and RCA coronary artery noted. Aortic Atherosclerosis (ICD10-I70.0) and Emphysema (ICD10-J43.9). Electronically Signed   By: Kerby Moors M.D.   On: 02/01/2018 10:33    ASSESSMENT: Bilateral pulmonary nodule.  PLAN:    1. Bilateral pulmonary nodule: Previously, patient underwent biopsies of both lesions on October 27, 2017 that were negative for malignancies.  CT scan results reviewed independently and report as above.  Despite this, still suspicion of synchronous primaries in his right and left upper lobe. Percutaneous CT-guided biopsy was thought to be high risk for pneumothorax.  Repeat CT  scan in 3 months to assess for interval change.  If lesions increase in size, will consider repeat biopsy or possibly treatment with SBRT.  Return to clinic 1-2 days after CT scan to discuss the results and additional treatment or diagnostic planning. 2.  Smoking cessation: Patient was given resources and states he plans to quit.  Approximately 20 minutes was spent in discussion of which greater than 50% was consultation.  Patient expressed understanding and was in agreement with this plan. He also understands that He can call clinic at any time with any questions, concerns, or complaints.   Cancer Staging No matching staging information was found for the patient.  Lloyd Huger, MD   02/06/2018 12:20 PM

## 2018-02-06 ENCOUNTER — Inpatient Hospital Stay: Payer: BLUE CROSS/BLUE SHIELD | Attending: Oncology | Admitting: Oncology

## 2018-02-06 ENCOUNTER — Encounter: Payer: Self-pay | Admitting: Oncology

## 2018-02-06 VITALS — BP 110/75 | HR 102 | Temp 97.3°F | Resp 20 | Wt 139.5 lb

## 2018-02-06 DIAGNOSIS — F17210 Nicotine dependence, cigarettes, uncomplicated: Secondary | ICD-10-CM | POA: Diagnosis not present

## 2018-02-06 DIAGNOSIS — R911 Solitary pulmonary nodule: Secondary | ICD-10-CM

## 2018-02-06 DIAGNOSIS — R918 Other nonspecific abnormal finding of lung field: Secondary | ICD-10-CM | POA: Insufficient documentation

## 2018-02-06 NOTE — Progress Notes (Signed)
Patient here today for CT results, denies any other concerns.

## 2018-02-08 DIAGNOSIS — J449 Chronic obstructive pulmonary disease, unspecified: Secondary | ICD-10-CM

## 2018-02-08 NOTE — Progress Notes (Signed)
Daily Session Note  Patient Details  Name: Leslie Sims MRN: [id number] Date of Birth: 1951-10-31 Referring Provider:     Pulmonary Rehab from 01/24/2018 in [facility] Cardiac and Pulmonary Rehab  Referring Provider  [name]      Encounter Date: 02/08/2018  Check In: Session Check In - 02/08/18 1126      Check-In   Location  [facility]-Cardiac & Pulmonary Rehab    Staff Present  Alberteen Sam, MA, RCEP, CCRP, Exercise Physiologist;Elease Swarm Sherryll Burger, RN BSN;Joseph Flavia Shipper    Supervising physician immediately available to respond to emergencies  [facility] immediately available ER MD    Physician(s)  Dr. Reita Cliche and Quentin Cornwall    Medication changes reported      No    Fall or balance concerns reported     No    Warm-up and Cool-down  Performed as group-led instruction    Resistance Training Performed  Yes    VAD Patient?  No      Pain Assessment   Currently in Pain?  No/denies          Social History   Tobacco Use  Smoking Status Current Every Day Smoker  . Packs/day: 1.00  . Years: 42.00  . Pack years: 42.00  . Types: Cigarettes  Smokeless Tobacco Never Used  Tobacco Comment   01/24/2018 states smoking less than a pack a day. Wants to quit.  Has contact number for smoking cessation counselor    Goals Met:  Proper associated with RPD/PD & O2 Sat Independence with exercise equipment Using PLB without cueing & demonstrates good technique Exercise tolerated well Strength training completed today  Goals Unmet:  Not Applicable  Comments: Pt able to follow exercise prescription today without complaint.  Will continue to monitor for progression.    Dr. Emily Filbert is Medical Director for [place] and [facility].

## 2018-02-10 ENCOUNTER — Encounter: Payer: Self-pay | Admitting: Pulmonary Disease

## 2018-02-10 ENCOUNTER — Ambulatory Visit (INDEPENDENT_AMBULATORY_CARE_PROVIDER_SITE_OTHER): Payer: BLUE CROSS/BLUE SHIELD | Admitting: Pulmonary Disease

## 2018-02-10 VITALS — BP 112/60 | HR 104 | Resp 16 | Ht 69.5 in | Wt 142.0 lb

## 2018-02-10 DIAGNOSIS — R918 Other nonspecific abnormal finding of lung field: Secondary | ICD-10-CM | POA: Diagnosis not present

## 2018-02-10 DIAGNOSIS — F17200 Nicotine dependence, unspecified, uncomplicated: Secondary | ICD-10-CM | POA: Diagnosis not present

## 2018-02-10 DIAGNOSIS — J449 Chronic obstructive pulmonary disease, unspecified: Secondary | ICD-10-CM

## 2018-02-10 DIAGNOSIS — J9611 Chronic respiratory failure with hypoxia: Secondary | ICD-10-CM | POA: Diagnosis not present

## 2018-02-10 NOTE — Addendum Note (Signed)
Addended by: Renelda Mom on: 02/10/2018 03:13 PM   Modules accepted: Orders

## 2018-02-10 NOTE — Patient Instructions (Signed)
Continue Advair and Spiriva Continue albuterol inhaler as needed Smoking cessation as we discussed Continue oxygen therapy with sleep and exertion We will try to obtain a portable oxygen concentrator through [place] Patient Follow-up in 4 months with chest x-ray prior to that visit

## 2018-02-10 NOTE — Progress Notes (Signed)
Daily Session Note  Patient Details  Name: Leslie Sims MRN: [id number] Date of Birth: Oct 11, 1951 Referring Provider:     Pulmonary Rehab from 01/24/2018 in [facility] Cardiac and Pulmonary Rehab  Referring Provider  [name]      Encounter Date: 02/10/2018  Check In: Session Check In - 02/10/18 1145      Check-In   Location  [facility]-Cardiac & Pulmonary Rehab    Staff Present  Renita Papa, RN BSN;Henrine Hayter Darrin Nipper, [place], RCEP, [place], Exercise Physiologist    Supervising physician immediately available to respond to emergencies  [facility] immediately available ER MD    Physician(s)   Irwin Brakeman and Paduchowski    Medication changes reported      No    Fall or balance concerns reported     No    Tobacco Cessation  No Change    Warm-up and Cool-down  Performed as group-led instruction    Resistance Training Performed  Yes    VAD Patient?  No      Pain Assessment   Currently in Pain?  No/denies          Social History   Tobacco Use  Smoking Status Current Every Day Smoker  . Packs/day: 1.00  . Years: 42.00  . Pack years: 42.00  . Types: Cigarettes  Smokeless Tobacco Never Used  Tobacco Comment   01/24/2018 states smoking less than a pack a day. Wants to quit.  Has contact number for smoking cessation counselor    Goals Met:  Independence with exercise equipment Exercise tolerated well No report of cardiac concerns or symptoms Strength training completed today  Goals Unmet:  Not Applicable  Comments: Pt able to follow exercise prescription today without complaint.  Will continue to monitor for progression.   Dr. Emily Filbert is Medical Director for [place] and [facility].

## 2018-02-10 NOTE — Progress Notes (Signed)
PULMONARY OFFICE FOLLOW UP NOTE  Requesting MD/Service: [name], MD Date of initial consultation: 08/10/16 Reason for consultation: COPD, smoker  PT PROFILE: 27 M smoker with severe emphysema seen by multiple pulmonologists in past ([place], [place], [place], [place]) referred for further eval and mgmt of COPD   DATA: 07/24/12 Office Spirometry: Severe obstruction (FEV1 31% pred) 08/31/13 Office Spirometry: very severe obstruction (FEV1 25% pred) 06/18/15 CT chest: Severe emphysema 03/03/17 Overnight oximetry: Lowest SpO2 72%. 95% of time SpO2 was below 90% 03/03/17 6MWT: 384 meters. Desat to 84% 10/04/17 CT chest: Two new irregularly marginated nodules, one in the right upper lobe, and a second in the mid left upper lobe worrisome for either synchronous lung carcinoma or metastatic involvement of the lungs. The two small nodules described on the prior dictated report have resolved in the left superior segment and most likely were postinflammatory. Diffuse centrilobular emphysema. 10/13/17 PET scan: The new left upper lobe nodule is FDG avid consistent with malignancy. Recommend tissue confirmation. The new nodule in the right upper lobe demonstrates low level uptake. While the level of uptake suggests the possibility of an inflammatory or infectious process, a low-grade malignancy is not excluded. Given the suspected malignancy on the left, tissue confirmation should be considered on the right despite the low level of uptake. No distant metastatic disease identified 10/27/17 ENB ([place]): Transbronchial Fine Needle Aspirations 21G X7, Transbronchial Forceps Biopsy X6. ALVEOLATED LUNG TISSUE WITH HEMOSIDERIN LADEN MACROPHAGES.  NEGATIVE FOR ATYPIA AND MALIGNANCY 12/07/17 CTA chest: No pulmonary embolus identified. 2. Anterior left upper lobe ground-glass and nodular consolidation likely representing pneumonitis. 3. Stable bilateral upper lobe pulmonary nodules.4. Stable emphysema. 01/21/18 CT  chest: There is new masslike architectural distortion and ground-glass attenuation surrounding the index nodule within the right upper lobe. The appearance favors an inflammatory or infectious process. Underlying tumor is considered less favored but not excluded and follow-up imaging with a repeat CT of the chest following appropriate antibiotic therapy is recommended in 3-4 weeks following trial of antibiotic therapy to ensure resolution and exclude underlying malignancy. 2. The index nodule in the left upper lobe is slightly decreased in volume when compared with the previous exam favoring a benign process. Attention on follow-up imaging advise. 3. Advanced changes of emphysema (very severe)  INTERVAL: Hospitalized 01/07-01/11/19 for COPD exacerbation. Required BiPAP briefly   SUBJ: This is a routine reevaluation.  He continues to smoke 16-18 cigarettes/day.  He has plans to enroll in a smoking cessation course next week.  He continues to have moderate to severe exertional dyspnea.  He wears his oxygen with sleep and exertion.  He has chronic cough productive of "phlegm".  He denies hemoptysis and pleuritic chest pain.  He is maintained on Advair and Spiriva.  He uses his albuterol rescue inhaler 1-2 times per day on most days.  OBJ: Vitals:   02/10/18 1425 02/10/18 1430  BP:  112/60  Pulse:  (!) 104  Resp: 16   SpO2:  94%  Weight: 64.4 kg (142 lb)   Height: 5' 9.5" (1.765 m)   RA  EXAM:  Gen: No overt distress, appears older than true age HEENT: NCAT, sclera white, oropharynx normal Neck: Supple without LAN, thyromegaly, JVD Lungs: breath sounds moderately diminished, no wheezes or other adventitious sounds Cardiovascular: RRR, no murmurs noted Abdomen: Soft, nontender, normal BS Ext: Tobacco staining of digits.  No clubbing, cyanosis or edema Neuro: No focal deficits on limited exam  DATA:   BMP Latest Ref Rng & Units 12/29/2017  12/26/2017 12/25/2017  Glucose 65 - 99 mg/dL 118(H)  205(H) 164(H)  BUN 6 - 20 mg/dL 23(H) 20 19  Creatinine 0.61 - 1.24 mg/dL 0.65 0.68 0.70  BUN/Creat Ratio 10 - 24 - - -  Sodium 135 - 145 mmol/L 140 138 139  Potassium 3.5 - 5.1 mmol/L 4.4 4.2 4.5  Chloride 101 - 111 mmol/L 96(L) 95(L) 93(L)  CO2 22 - 32 mmol/L 36(H) 35(H) 36(H)  Calcium 8.9 - 10.3 mg/dL 9.2 9.0 8.9    CBC Latest Ref Rng & Units 12/29/2017 12/26/2017 12/25/2017  WBC 3.8 - 10.6 K/uL 11.0(H) 4.5 7.4  Hemoglobin 13.0 - 18.0 g/dL 13.1 13.0 14.1  Hematocrit 40.0 - 52.0 % 40.4 39.3(L) 43.1  Platelets 150 - 440 K/uL 243 176 210   CXR:  NNF  IMPRESSION:   COPD, very severe ([place]) - Plan: DG Chest 2 View  Smoker  Chronic hypoxemic respiratory failure ([place])  Multiple pulmonary  -I still have some concern that these might represent malignancy.  If so, there would be little that we could do given the severity of his COPD - Plan: DG Chest 2 View   PLAN:  Continue Advair and Spiriva Continue albuterol inhaler as needed Smoking cessation as we discussed Continue oxygen therapy with sleep and exertion We will try to obtain a portable oxygen concentrator through [place] Patient Follow-up in 4 months with chest x-ray prior to that visit   Merton Border, MD PCCM service Mobile [phone] Pager [phone] 02/10/2018  2:49 PM

## 2018-02-13 DIAGNOSIS — J449 Chronic obstructive pulmonary disease, unspecified: Secondary | ICD-10-CM | POA: Diagnosis not present

## 2018-02-13 NOTE — Progress Notes (Signed)
Daily Session Note  Patient Details  Name: Leslie Sims MRN: [id number] Date of Birth: 15-Jun-1951 Referring Provider:     Pulmonary Rehab from 01/24/2018 in [facility] Cardiac and Pulmonary Rehab  Referring Provider  [name]      Encounter Date: 02/13/2018  Check In: Session Check In - 02/13/18 1140      Check-In   Location  [facility]Cardiac & Pulmonary Rehab    Staff Present  Earlean Shawl, BS, ACSM CEP, Exercise Physiologist;Amanda Oletta Darter, BA, ACSM CEP, Exercise Physiologist;Courtney Fenlon Flavia Shipper    Supervising physician immediately available to respond to emergencies  [facility] immediately available ER MD    Physician(s)  Dr. Reita Cliche and Mariea Clonts    Medication changes reported      No    Fall or balance concerns reported     No    Warm-up and Cool-down  Performed as group-led instruction    Resistance Training Performed  Yes    VAD Patient?  No      Pain Assessment   Currently in Pain?  No/denies          Social History   Tobacco Use  Smoking Status Current Every Day Smoker  . Packs/day: 1.00  . Years: 42.00  . Pack years: 42.00  . Types: Cigarettes  Smokeless Tobacco Never Used  Tobacco Comment   01/24/2018 states smoking less than a pack a day. Wants to quit.  Has contact number for smoking cessation counselor    Goals Met:  Independence with exercise equipment Exercise tolerated well No report of cardiac concerns or symptoms Strength training completed today  Goals Unmet:  Not Applicable  Comments: Pt able to follow exercise prescription today without complaint.  Will continue to monitor for progression.   Dr. Emily Filbert is Medical Director for [place] and [facility].

## 2018-02-15 DIAGNOSIS — J449 Chronic obstructive pulmonary disease, unspecified: Secondary | ICD-10-CM | POA: Diagnosis not present

## 2018-02-15 NOTE — Progress Notes (Signed)
Daily Session Note  Patient Details  Name: Leslie Sims MRN: [id number] Date of Birth: Jan 13, 1951 Referring Provider:     Pulmonary Rehab from 01/24/2018 in [facility] Cardiac and Pulmonary Rehab  Referring Provider  [name]      Encounter Date: 02/15/2018  Check In: Session Check In - 02/15/18 1127      Check-In   Location  [facility]Cardiac & Pulmonary Rehab    Staff Present  Justin Mend RCP,RRT,BSRT;Meredith Sherryll Burger, RN BSN;Jessica Luan Pulling, MA, RCEP, CCRP, Exercise Physiologist    Supervising physician immediately available to respond to emergencies  [facility] immediately available ER MD    Physician(s)  Dr. Reita Cliche and Jimmye Norman    Medication changes reported      No    Fall or balance concerns reported     No    Tobacco Cessation  No Change    Warm-up and Cool-down  Performed as group-led instruction    Resistance Training Performed  Yes    VAD Patient?  No      Pain Assessment   Currently in Pain?  No/denies          Social History   Tobacco Use  Smoking Status Current Every Day Smoker  . Packs/day: 1.00  . Years: 42.00  . Pack years: 42.00  . Types: Cigarettes  Smokeless Tobacco Never Used  Tobacco Comment   01/24/2018 states smoking less than a pack a day. Wants to quit.  Has contact number for smoking cessation counselor    Goals Met:  Independence with exercise equipment Exercise tolerated well No report of cardiac concerns or symptoms Strength training completed today  Goals Unmet:  Not Applicable  Comments: Pt able to follow exercise prescription today without complaint.  Will continue to monitor for progression. Reviewed home exercise with pt today.  Pt plans to walk and eventually use elliptical at home for exercise.  Reviewed THR, pulse, RPE, sign and symptoms, NTG use, and when to call 911 or MD.  Also discussed weather considerations and indoor options.  Pt voiced understanding.   Dr. Emily Filbert is Medical Director for [place] and [facility].

## 2018-02-17 ENCOUNTER — Encounter: Payer: BLUE CROSS/BLUE SHIELD | Attending: Pulmonary Disease | Admitting: *Deleted

## 2018-02-17 DIAGNOSIS — J449 Chronic obstructive pulmonary disease, unspecified: Secondary | ICD-10-CM | POA: Diagnosis present

## 2018-02-17 DIAGNOSIS — J9611 Chronic respiratory failure with hypoxia: Secondary | ICD-10-CM | POA: Insufficient documentation

## 2018-02-17 NOTE — Progress Notes (Signed)
Daily Session Note  Patient Details  Name: Leslie Sims MRN: [id number] Date of Birth: Jan 16, 1951 Referring Provider:     Pulmonary Rehab from 01/24/2018 in [facility] Cardiac and Pulmonary Rehab  Referring Provider  [name]      Encounter Date: 02/17/2018  Check In: Session Check In - 02/17/18 1129      Check-In   Staff Present  Renita Papa, RN Geralyn Corwin, RN BSN;Jessica Luan Pulling, MA, RCEP, CCRP, Exercise Physiologist    Supervising physician immediately available to respond to emergencies  [facility] immediately available ER MD    Physician(s)  Drs. Paduchowski and Schaevitz    Medication changes reported      No    Fall or balance concerns reported     No    Tobacco Cessation  No Change    Warm-up and Cool-down  Performed as group-led Higher education careers adviser Performed  Yes    VAD Patient?  No      Pain Assessment   Currently in Pain?  No/denies          Social History   Tobacco Use  Smoking Status Current Every Day Smoker  . Packs/day: 0.50  . Years: 42.00  . Pack years: 21.00  . Types: Cigarettes  Smokeless Tobacco Never Used  Tobacco Comment   02/15/18 For tobacco cessation he is getting better.  He is staying between 6-18 cigarettes a day versus a full pack.  He is also set up to start a one on one smoking cessation class since the current classes were already in progress and did not restart un    Goals Met:  Proper associated with RPD/PD & O2 Sat Independence with exercise equipment Using PLB without cueing & demonstrates good technique Exercise tolerated well Strength training completed today  Goals Unmet:  Not Applicable  Comments: Pt able to follow exercise prescription today without complaint.  Will continue to monitor for progression.    Dr. Emily Filbert is Medical Director for [place] and [facility].

## 2018-02-20 ENCOUNTER — Encounter: Payer: BLUE CROSS/BLUE SHIELD | Admitting: *Deleted

## 2018-02-20 DIAGNOSIS — J449 Chronic obstructive pulmonary disease, unspecified: Secondary | ICD-10-CM

## 2018-02-20 NOTE — Progress Notes (Signed)
Daily Session Note  Patient Details  Name: Leslie Sims MRN: [id number] Date of Birth: Mar 17, 1951 Referring Provider:     Pulmonary Rehab from 01/24/2018 in [facility] Cardiac and Pulmonary Rehab  Referring Provider  [name]      Encounter Date: 02/20/2018  Check In: Session Check In - 02/20/18 1137      Check-In   Location  [facility]Cardiac [facility] Pulmonary Rehab    Staff Present  Earlean Shawl, BS, ACSM CEP, Exercise Physiologist;Amanda Oletta Darter, BA, ACSM CEP, Exercise Physiologist;Joseph Flavia Shipper    Supervising physician immediately available to respond to emergencies  [facility] immediately available ER MD    Physician(s)  Drs. Mc[place]ane and Williams    Medication changes reported      No    Fall or balance concerns reported     No    Warm-up and Cool-down  Performed as group-led Higher education careers adviser Performed  Yes    VAD Patient?  No      Pain Assessment   Currently in Pain?  No/denies          Social History   Tobacco Use  Smoking Status Current Every Day Smoker  . Packs/day: 0.50  . Years: 42.00  . Pack years: 21.00  . Types: Cigarettes  Smokeless Tobacco Never Used  Tobacco Comment   02/15/18 For tobacco cessation he is getting better.  He is staying between 6-18 cigarettes a day versus a full pack.  He is also set up to start a one on one smoking cessation class since the current classes were already in progress and did not restart un    Goals Met:  Proper associated with RPD/PD & O2 Sat Independence with exercise equipment Using PLB without cueing & demonstrates good technique Exercise tolerated well No report of cardiac concerns or symptoms Strength training completed today  Goals Unmet:  Not Applicable  Comments: Pt able to follow exercise prescription today without complaint.  Will continue to monitor for progression.    Dr. Emily Filbert is Medical Director for [place] and [facility].

## 2018-02-22 ENCOUNTER — Encounter: Payer: BLUE CROSS/BLUE SHIELD | Admitting: *Deleted

## 2018-02-22 DIAGNOSIS — J449 Chronic obstructive pulmonary disease, unspecified: Secondary | ICD-10-CM

## 2018-02-22 NOTE — Progress Notes (Signed)
Daily Session Note  Patient Details  Name: Leslie Sims MRN: [id number] Date of Birth: [birth year] Referring Provider:     Pulmonary Rehab from 01/24/2018 in [facility] Cardiac and Pulmonary Rehab  Referring Provider  [name]      Encounter Date: 02/22/2018  Check In: Session Check In - 02/22/18 1124      Check-In   Location  [facility]Cardiac [facility] Pulmonary Rehab    Staff Present  Renita Papa, RN BSN;Joseph Darrin Nipper, [place], RCEP, [place], Exercise Physiologist    Supervising physician immediately available to respond to emergencies  [facility] immediately available ER MD    Physician(s)  Dr. Joni Fears and Jimmye Norman     Medication changes reported      No    Fall or balance concerns reported     No    Warm-up and Cool-down  Performed as group-led instruction    Resistance Training Performed  Yes    VAD Patient?  No      Pain Assessment   Currently in Pain?  No/denies          Social History   Tobacco Use  Smoking Status Current Every Day Smoker  . Packs/day: 0.50  . Years: 42.00  . Pack years: 21.00  . Types: Cigarettes  Smokeless Tobacco Never Used  Tobacco Comment   02/15/18 For tobacco cessation he is getting better.  He is staying between 6-18 cigarettes a day versus a full pack.  He is also set up to start a one on one smoking cessation class since the current classes were already in progress and did not restart un    Goals Met:  Proper associated with RPD/PD & O2 Sat Independence with exercise equipment Using PLB without cueing & demonstrates good technique Exercise tolerated well Strength training completed today  Goals Unmet:  Not Applicable  Comments: Pt able to follow exercise prescription today without complaint.  Will continue to monitor for progression.    Dr. Emily Filbert is Medical Director for [place] and [facility].

## 2018-02-24 ENCOUNTER — Encounter: Payer: BLUE CROSS/BLUE SHIELD | Admitting: *Deleted

## 2018-02-24 DIAGNOSIS — J449 Chronic obstructive pulmonary disease, unspecified: Secondary | ICD-10-CM

## 2018-02-24 NOTE — Progress Notes (Signed)
Daily Session Note  Patient Details  Name: Malekai Markwood MRN: [id number] Date of Birth: 1951/04/25 Referring Provider:     Pulmonary Rehab from 01/24/2018 in [facility] Cardiac and Pulmonary Rehab  Referring Provider  [name]      Encounter Date: 02/24/2018  Check In: Session Check In - 02/24/18 1157      Check-In   Location  [facility]    Staff Present  Renita Papa, RN BSN;Mary Kellie Shropshire, RN, BSN, [place], MA, RCEP, CCRP, Exercise Physiologist    Supervising physician immediately available to respond to emergencies  [facility] immediately available ER MD    Physician(s)  Dr. Reita Cliche and Archie Balboa     Medication changes reported      No    Fall or balance concerns reported     No    Warm-up and Cool-down  Performed as group-led instruction    Resistance Training Performed  Yes    VAD Patient?  No      Pain Assessment   Currently in Pain?  No/denies          Social History   Tobacco Use  Smoking Status Current Every Day Smoker  . Packs/day: 0.50  . Years: 42.00  . Pack years: 21.00  . Types: Cigarettes  Smokeless Tobacco Never Used  Tobacco Comment   02/15/18 For tobacco cessation he is getting better.  He is staying between 6-18 cigarettes a day versus a full pack.  He is also set up to start a one on one smoking cessation class since the current classes were already in progress and did not restart un    Goals Met:  Proper associated with RPD/PD & O2 Sat Independence with exercise equipment Using PLB without cueing & demonstrates good technique Exercise tolerated well Strength training completed today  Goals Unmet:  Not Applicable  Comments: Pt able to follow exercise prescription today without complaint.  Will continue to monitor for progression.    Dr. Emily Filbert is Medical Director for [place] and [facility].

## 2018-02-27 DIAGNOSIS — J449 Chronic obstructive pulmonary disease, unspecified: Secondary | ICD-10-CM

## 2018-02-27 NOTE — Progress Notes (Signed)
Daily Session Note  Patient Details  Name: Leslie Sims MRN: [id number] Date of Birth: January 23, 1951 Referring Provider:     Pulmonary Rehab from 01/24/2018 in [facility] Cardiac and Pulmonary Rehab  Referring Provider  [name]      Encounter Date: 02/27/2018  Check In: Session Check In - 02/27/18 1209      Check-In   Location  [facility]iac [facility]monary Re[facility]    Staff Present  Earlean Shawl, BS, ACSM CEP, Exercise Physiologist;Amanda Oletta Darter, BA, ACSM CEP, Exercise Physiologist;Katarzyna Wolven Flavia Shipper    Supervising physician immediately available to respond to emergencies  See telemetry face sheet for immediately available ER MD    Medication changes reported      No    Fall or balance concerns reported     No    Warm-up and Cool-down  Performed on first and last piece of equipment    Resistance Training Performed  Yes    VAD Patient?  No      Pain Assessment   Currently in Pain?  No/denies    Multiple Pain Sites  No          Social History   Tobacco Use  Smoking Status Current Every Day Smoker  . Packs/day: 0.50  . Years: 42.00  . Pack years: 21.00  . Types: Cigarettes  Smokeless Tobacco Never Used  Tobacco Comment   02/15/18 For tobacco cessation he is getting better.  He is staying between 6-18 cigarettes a day versus a full pack.  He is also set up to start a one on one smoking cessation class since the current classes were already in progress and did not restart un    Goals Met:  Independence with exercise equipment Exercise tolerated well No report of cardiac concerns or symptoms Strength training completed today  Goals Unmet:  Not Applicable  Comments: Pt able to follow exercise prescription today without complaint.  Will continue to monitor for progression.    Dr. Emily Filbert is Medical Director for [place] and [facility].

## 2018-02-27 NOTE — Progress Notes (Signed)
Pulmonary Individual Treatment Plan  Patient Details  Name: Leslie Sims MRN: [id number] Date of Birth: 1951-11-08 Referring Provider:     Pulmonary Rehab from 01/24/2018 in [facility] Cardiac and Pulmonary Rehab  Referring Provider  [name]      Initial Encounter Date:    Pulmonary Rehab from 01/24/2018 in [facility] Cardiac and Pulmonary Rehab  Date  01/24/18  Referring Provider  [name]      Visit Diagnosis: Chronic obstructive pulmonary disease, unspecified COPD type ([place])  Patient's Home Medications on Admission:  Current Outpatient Medications:  .  albuterol (VENTOLIN HFA) 108 (90 Base) MCG/ACT inhaler, USE 2 INHALATIONS EVERY 6 HOURS AS NEEDED FOR WHEEZING, Disp: 48 g, Rfl: 3 .  aspirin 81 MG tablet, Take 81 mg by mouth daily.  , Disp: , Rfl:  .  atorvastatin (LIPITOR) 40 MG tablet, Take 1 tablet (40 mg total) by mouth daily., Disp: 90 tablet, Rfl: 3 .  Fluticasone-Salmeterol (ADVAIR DISKUS) 500-50 MCG/DOSE AEPB, Inhale 1 puff into the lungs 2 (two) times daily., Disp: 180 each, Rfl: 3 .  furosemide (LASIX) 20 MG tablet, Take 1 tablet (20 mg total) by mouth daily., Disp: 30 tablet, Rfl: 0 .  ipratropium-albuterol (DUONEB) 0.5-2.5 (3) MG/3ML SOLN, Take 3 mLs by nebulization every 6 (six) hours as needed., Disp: 225 mL, Rfl: 3 .  ketoconazole (NIZORAL) 2 % cream, Apply 1 application topically 2 (two) times daily., Disp: 60 g, Rfl: 0 .  LORazepam (ATIVAN) 1 MG tablet, TAKE 1/2 TABLET BY MOUTH EVERY 6 HOURS AS NEEDED FOR ANXIETY, Disp: 60 tablet, Rfl: 1 .  losartan (COZAAR) 25 MG tablet, TAKE 1 TABLET DAILY, Disp: 90 tablet, Rfl: 3 .  mirtazapine (REMERON) 15 MG tablet, Take 1 tablet (15 mg total) by mouth at bedtime., Disp: 30 tablet, Rfl: 5 .  Multiple Vitamin (MULTIVITAMIN) capsule, Take 1 capsule by mouth daily., Disp: , Rfl:  .  nitroGLYCERIN (NITROSTAT) 0.4 MG SL tablet, Place 1 tablet (0.4 mg total) under the tongue every 5 (five) minutes as needed., Disp: 25 tablet, Rfl: 3 .   sertraline (ZOLOFT) 100 MG tablet, Take 1 tablet (100 mg total) by mouth at bedtime., Disp: 90 tablet, Rfl: 1 .  sucralfate (CARAFATE) 1 GM/10ML suspension, Take 10 mLs (1 g total) by mouth 4 (four) times daily -  with meals and at bedtime., Disp: 420 mL, Rfl: 4 .  tiotropium (SPIRIVA HANDIHALER) 18 MCG inhalation capsule, Place 1 capsule (18 mcg total) into inhaler and inhale daily., Disp: 90 capsule, Rfl: 3  Past Medical History: Past Medical History:  Diagnosis Date  . Allergic rhinitis   . Anxiety   . Atherosclerosis   . Back pain   . Bronchitis    09-14-11  . CARDIOMYOPATHY 03/24/2010   did not tolerate Toprol and Lisinopril.   . Chest pain   . CHF (congestive heart failure) ([place])   . Chronic airway obstruction, not elsewhere classified   . Colon polyps 08/2012   colonoscopy; multiple colon polyps; repeat colonoscopy in one year.  [name] COPD (chronic obstructive pulmonary disease) ([place])   . Coronary artery disease 11/2009   Anterior MI with late presentation 11/2009. Cath showed a 100% proximal LAD occlusion, 99% mid RCA. Had a PCI and 2 DES to both LAD and RCA. EF was 35%. Nuclear stress test 05/13: prior anterior/inferior infarcts without ischemic, EF 43%, cardiac cath in 02/14: Widely patent stents with no other obstructive disease. EF 40%   . Depression   . GERD (gastroesophageal reflux  disease)   . Headache(784.0)   . Helicobacter pylori (H. pylori)   . Hemorrhoids   . Hernia    inguinal  . Hyperlipidemia   . Hypertension   . Insomnia   . MI (myocardial infarction) ([place])   . Myalgia   . Peptic ulcer   . Shortness of breath dyspnea   . Status post dilation of esophageal narrowing 2000  . Thyroid dysfunction   . Tinea pedis   . Wears dentures    partial upper    Tobacco Use: Social History   Tobacco Use  Smoking Status Current Every Day Smoker  . Packs/day: 0.50  . Years: 42.00  . Pack years: 21.00  . Types: Cigarettes  Smokeless Tobacco Never Used  Tobacco  Comment   02/15/18 For tobacco cessation he is getting better.  He is staying between 6-18 cigarettes a day versus a full pack.  He is also set up to start a one on one smoking cessation class since the current classes were already in progress and did not restart un    Labs: Recent Review Flowsheet Data    Labs for ITP Cardiac and Pulmonary Rehab Latest Ref Rng & Units 03/01/2017 06/17/2017 07/27/2017 12/23/2017 12/25/2017   Cholestrol 100 - 199 mg/dL 112 - 143 - -   LDLCALC 0 - 99 mg/dL 47 - 67 - -   HDL >39 mg/dL 33(L) - 62 - -   Trlycerides 0 - 149 mg/dL 158(H) - 72 - -   Hemoglobin A1c 4.8 - 5.6 % 5.3 5.8(H) - - -   HCO3 20.0 - 28.0 mmol/L - - - 37.2(H) 41.4(H)   O2SAT % - - - 87.4 81.6       Pulmonary Assessment Scores: Pulmonary Assessment Scores    Row Name 01/24/18 1247         ADL UCSD   ADL Phase  Entry New Entry numbers 01/24/2018     SOB Score total  86     Rest  2     Walk  4     Stairs  5     Bath  3     Dress  4     Shop  5       CAT Score   CAT Score  28       mMRC Score   mMRC Score  1        Pulmonary Function Assessment: Pulmonary Function Assessment - 01/24/18 1246      Pulmonary Function Tests   FVC%  72 % date performed 11/01/17    FEV1%  27 %    FEV1/FVC Ratio  30      Breath   Shortness of Breath  Yes;Limiting activity;Fear of Shortness of Breath       Exercise Target Goals:    Exercise Program Goal: Individual exercise prescription set using results from initial 6 min walk test and THRR while considering  patient's activity barriers and safety.    Exercise Prescription Goal: Initial exercise prescription builds to 30-45 minutes a day of aerobic activity, 2-3 days per week.  Home exercise guidelines will be given to patient during program as part of exercise prescription that the participant will acknowledge.  Activity Barriers & Risk Stratification:   6 Minute Walk: 6 Minute Walk    Row Name 01/24/18 1258         6 Minute Walk    Distance  1000 feet     Walk Time  4.75  minutes     # of Rest Breaks  1     MPH  2.4     METS  3.4     RPE  11     Perceived Dyspnea   2     VO2 Peak  11.9     Symptoms  No     Resting HR  93 bpm     Resting BP  106/72     Resting Oxygen Saturation   95 %     Exercise Oxygen Saturation  during 6 min walk  80 %     Max Ex. HR  123 bpm     Max Ex. BP  132/64     2 Minute Post BP  108/64       Interval HR   1 Minute HR  106     2 Minute HR  104     3 Minute HR  102     4 Minute HR  123     6 Minute HR  120     2 Minute Post HR  97     Interval Heart Rate?  Yes       Interval Oxygen   Interval Oxygen?  Yes     Baseline Oxygen Saturation %  95 %     1 Minute Oxygen Saturation %  82 %     1 Minute Liters of Oxygen  2 L     2 Minute Oxygen Saturation %  82 %     2 Minute Liters of Oxygen  2 L     3 Minute Oxygen Saturation %  81 %     3 Minute Liters of Oxygen  2 L     4 Minute Oxygen Saturation %  83 %     4 Minute Liters of Oxygen  2 L     5 Minute Oxygen Saturation %  81 %     5 Minute Liters of Oxygen  2 L     6 Minute Oxygen Saturation %  80 %     6 Minute Liters of Oxygen  2 L     2 Minute Post Oxygen Saturation %  95 %     2 Minute Post Liters of Oxygen  2 L       Oxygen Initial Assessment: Oxygen Initial Assessment - 01/24/18 1220      Home Oxygen   Home Oxygen Device  Home Concentrator;E-Tanks    Sleep Oxygen Prescription  Continuous    Liters per minute  2    Home Exercise Oxygen Prescription  Continuous    Liters per minute  2 UNtil sees his pulmonary Doctor again.  This is post discharge form January 2019    Home at Rest Exercise Oxygen Prescription  Continuous    Liters per minute  2    Compliance with Home Oxygen Use  Yes      Initial 6 min Walk   Oxygen Used  Continuous;E-Tanks    Liters per minute  2      Program Oxygen Prescription   Program Oxygen Prescription  Continuous;E-Tanks    Liters per minute  2      Intervention   Short Term Goals   To learn and exhibit compliance with exercise, home and travel O2 prescription;To learn and understand importance of monitoring SPO2 with pulse oximeter and demonstrate accurate use of the pulse oximeter.;To learn and understand importance of maintaining oxygen saturations>88%;To learn and demonstrate proper  use of respiratory medications;To learn and demonstrate proper pursed lip breathing techniques or other breathing techniques.    Long  Term Goals  Exhibits compliance with exercise, home and travel O2 prescription;Verbalizes importance of monitoring SPO2 with pulse oximeter and return demonstration;Maintenance of O2 saturations>88%;Exhibits proper breathing techniques, such as pursed lip breathing or other method taught during program session;Compliance with respiratory medication;Demonstrates proper use of MDI's       Oxygen Re-Evaluation: Oxygen Re-Evaluation    Row Name 02/03/18 1136             Program Oxygen Prescription   Program Oxygen Prescription  Continuous;E-Tanks       Liters per minute  2         Home Oxygen   Home Oxygen Device  Home Concentrator;E-Tanks       Sleep Oxygen Prescription  Continuous       Liters per minute  2       Home Exercise Oxygen Prescription  Continuous       Liters per minute  2       Home at Rest Exercise Oxygen Prescription  Continuous       Liters per minute  2       Compliance with Home Oxygen Use  Yes         Goals/Expected Outcomes   Short Term Goals  To learn and exhibit compliance with exercise, home and travel O2 prescription;To learn and understand importance of monitoring SPO2 with pulse oximeter and demonstrate accurate use of the pulse oximeter.;To learn and understand importance of maintaining oxygen saturations>88%;To learn and demonstrate proper use of respiratory medications;To learn and demonstrate proper pursed lip breathing techniques or other breathing techniques.       Long  Term Goals  Exhibits compliance with exercise,  home and travel O2 prescription;Verbalizes importance of monitoring SPO2 with pulse oximeter and return demonstration;Maintenance of O2 saturations>88%;Exhibits proper breathing techniques, such as pursed lip breathing or other method taught during program session;Compliance with respiratory medication;Demonstrates proper use of MDI's       Comments  Reviewed PLB technique with pt.  Talked about how it work and it's important to maintaining his exercise saturations.         Goals/Expected Outcomes  Short: Become more profiecient at using PLB.   Long: Become independent at using PLB.          Oxygen Discharge (Final Oxygen Re-Evaluation): Oxygen Re-Evaluation - 02/03/18 1136      Program Oxygen Prescription   Program Oxygen Prescription  Continuous;E-Tanks    Liters per minute  2      Home Oxygen   Home Oxygen Device  Home Concentrator;E-Tanks    Sleep Oxygen Prescription  Continuous    Liters per minute  2    Home Exercise Oxygen Prescription  Continuous    Liters per minute  2    Home at Rest Exercise Oxygen Prescription  Continuous    Liters per minute  2    Compliance with Home Oxygen Use  Yes      Goals/Expected Outcomes   Short Term Goals  To learn and exhibit compliance with exercise, home and travel O2 prescription;To learn and understand importance of monitoring SPO2 with pulse oximeter and demonstrate accurate use of the pulse oximeter.;To learn and understand importance of maintaining oxygen saturations>88%;To learn and demonstrate proper use of respiratory medications;To learn and demonstrate proper pursed lip breathing techniques or other breathing techniques.    Long  Term Goals  Exhibits compliance with exercise, home and travel O2 prescription;Verbalizes importance of monitoring SPO2 with pulse oximeter and return demonstration;Maintenance of O2 saturations>88%;Exhibits proper breathing techniques, such as pursed lip breathing or other method taught during program  session;Compliance with respiratory medication;Demonstrates proper use of MDI's    Comments  Reviewed PLB technique with pt.  Talked about how it work and it's important to maintaining his exercise saturations.      Goals/Expected Outcomes  Short: Become more profiecient at using PLB.   Long: Become independent at using PLB.       Initial Exercise Prescription: Initial Exercise Prescription - 01/24/18 1300      Date of Initial Exercise RX and Referring Provider   Date  01/24/18    Referring Provider  [name]      Oxygen   Oxygen  Continuous    Liters  2      Treadmill   MPH  2.4    Grade  1.4    Minutes  15    METs  3.3      Recumbant Bike   Level  1    RPM  60    Watts  25    Minutes  15    METs  3.3      T5 Nustep   Level  2    SPM  80    Minutes  15    METs  3.3      Prescription Details   Frequency (times per week)  3    Duration  Progress to 45 minutes of aerobic exercise without signs/symptoms of physical distress      Intensity   THRR 40-80% of Max Heartrate  118-143    Ratings of Perceived Exertion  11-13    Perceived Dyspnea  0-4      Resistance Training   Training Prescription  Yes    Weight  3 lb    Reps  10-15       Perform Capillary Blood Glucose checks as needed.  Exercise Prescription Changes: Exercise Prescription Changes    Row Name 02/14/18 1000 02/15/18 1500           Response to Exercise   Blood Pressure (Admit)  142/80  -      Blood Pressure (Exercise)  126/74  -      Blood Pressure (Exit)  100/58  -      Heart Rate (Admit)  115 bpm  -      Heart Rate (Exercise)  130 bpm  -      Heart Rate (Exit)  101 bpm  -      Oxygen Saturation (Admit)  99 %  -      Oxygen Saturation (Exercise)  89 %  -      Oxygen Saturation (Exit)  98 %  -      Rating of Perceived Exertion (Exercise)  12  -      Perceived Dyspnea (Exercise)  2  -      Symptoms  none  -      Duration  Continue with 45 min of aerobic exercise without signs/symptoms of  physical distress.  -      Intensity  THRR unchanged  -        Progression   Progression  Continue to progress workloads to maintain intensity without signs/symptoms of physical distress.  -      Average METs  2.58  -        Resistance  Training   Training Prescription  Yes  -      Weight  4 lbs  -      Reps  10-15  -        Interval Training   Interval Training  No  -        Oxygen   Oxygen  Continuous  -      Liters  2  -        Treadmill   MPH  2.4  -      Grade  1  -      Minutes  15  -      METs  3.17  -        Recumbant Bike   Level  2  -      RPM  70  -      Watts  22  -      Minutes  15  -      METs  3.21  -        T5 Nustep   Level  3  -      SPM  86  -      Minutes  15  -      METs  2  -        Home Exercise Plan   Plans to continue exercise at  -  Home (comment) walking, ellipitical      Frequency  -  Add 2 additional days to program exercise sessions.      Initial Home Exercises Provided  -  02/15/18         Exercise Comments: Exercise Comments    Row Name 02/03/18 1135           Exercise Comments  First full day of exercise!  Patient was oriented to gym and equipment including functions, settings, policies, and procedures.  Patient's individual exercise prescription and treatment plan were reviewed.  All starting workloads were established based on the results of the 6 minute walk test done at initial orientation visit.  The plan for exercise progression was also introduced and progression will be customized based on patient's performance and goals.          Exercise Goals and Review: Exercise Goals    Row Name 01/24/18 1257             Exercise Goals   Increase Physical Activity  Yes       Intervention  Provide advice, education, support and counseling about physical activity/exercise needs.;Develop an individualized exercise prescription for aerobic and resistive training based on initial evaluation findings, risk stratification,  comorbidities and participant's personal goals.       Expected Outcomes  Short Term: Attend rehab on a regular basis to increase amount of physical activity.;Long Term: Add in home exercise to make exercise part of routine and to increase amount of physical activity.;Long Term: Exercising regularly at least 3-5 days a week.       Increase Strength and Stamina  Yes       Intervention  Provide advice, education, support and counseling about physical activity/exercise needs.;Develop an individualized exercise prescription for aerobic and resistive training based on initial evaluation findings, risk stratification, comorbidities and participant's personal goals.       Expected Outcomes  Short Term: Increase workloads from initial exercise prescription for resistance, speed, and METs.;Short Term: Perform resistance training exercises routinely during rehab and add in resistance training at home;Long Term: Improve cardiorespiratory fitness,  muscular endurance and strength as measured by increased METs and functional capacity (6MWT)       Able to understand and use rate of perceived exertion (RPE) scale  Yes       Intervention  Provide education and explanation on how to use RPE scale       Expected Outcomes  Short Term: Able to use RPE daily in rehab to express subjective intensity level;Long Term:  Able to use RPE to guide intensity level when exercising independently       Able to understand and use Dyspnea scale  Yes       Intervention  Provide education and explanation on how to use Dyspnea scale       Expected Outcomes  Short Term: Able to use Dyspnea scale daily in rehab to express subjective sense of shortness of breath during exertion;Long Term: Able to use Dyspnea scale to guide intensity level when exercising independently       Knowledge and understanding of Target Heart Rate Range (THRR)  Yes       Intervention  Provide education and explanation of THRR including how the numbers were predicted and  where they are located for reference       Expected Outcomes  Short Term: Able to state/look up THRR;Long Term: Able to use THRR to govern intensity when exercising independently;Short Term: Able to use daily as guideline for intensity in rehab       Able to check pulse independently  Yes       Intervention  Provide education and demonstration on how to check pulse in carotid and radial arteries.;Review the importance of being able to check your own pulse for safety during independent exercise       Expected Outcomes  Short Term: Able to explain why pulse checking is important during independent exercise;Long Term: Able to check pulse independently and accurately       Understanding of Exercise Prescription  Yes       Intervention  Provide education, explanation, and written materials on patient's individual exercise prescription       Expected Outcomes  Short Term: Able to explain program exercise prescription;Long Term: Able to explain home exercise prescription to exercise independently          Exercise Goals Re-Evaluation : Exercise Goals Re-Evaluation    Row Name 02/03/18 1135 02/14/18 1034 02/15/18 1513         Exercise Goal Re-Evaluation   Exercise Goals Review  Understanding of Exercise Prescription;Knowledge and understanding of Target Heart Rate Range (THRR);Able to understand and use rate of perceived exertion (RPE) scale;Able to understand and use Dyspnea scale  Increase Physical Activity;Understanding of Exercise Prescription;Increase Strength and Stamina  Increase Physical Activity;Understanding of Exercise Prescription;Increase Strength and Stamina;Able to understand and use Dyspnea scale;Knowledge and understanding of Target Heart Rate Range (THRR);Able to understand and use rate of perceived exertion (RPE) scale;Able to check pulse independently     Comments  Reviewed RPE scale, THR and program prescription with pt today.  Pt voiced understanding and was given a copy of goals to  take home.   Leslie Sims is off to a good start in rehab.  He is already up to level 2 on the recumbent bike and level 3 on the T5 NuStep.  We will continue to monitor his progression.   Reviewed home exercise with pt today.  Pt plans to walk and eventually use elliptical at home for exercise.  Reviewed THR, pulse, RPE, sign and symptoms,  NTG use, and when to call 911 or MD.  Also discussed weather considerations and indoor options.  Pt voiced understanding.     Expected Outcomes  Short: Use RPE daily to regulate intensity.  Long: Follow program prescription in THR.  Short: Continue to attend classes regularly.  Long: Continue to work on Animator.   Short: Add in at least one extra day of walking at home.  Long: Continue to build strength and stamina.         Discharge Exercise Prescription (Final Exercise Prescription Changes): Exercise Prescription Changes - 02/15/18 1500      Home Exercise Plan   Plans to continue exercise at  Home (comment) walking, ellipitical    Frequency  Add 2 additional days to program exercise sessions.    Initial Home Exercises Provided  02/15/18       Nutrition:  Target Goals: Understanding of nutrition guidelines, daily intake of sodium <1518m, cholesterol <2052m calories 30% from fat and 7% or less from saturated fats, daily to have 5 or more servings of fruits and vegetables.  Biometrics: Pre Biometrics - 01/24/18 1257      Pre Biometrics   Height  5' 9.5" (1.765 m)    Weight  141 lb 12.8 oz (64.3 kg)    Waist Circumference  35 inches    Hip Circumference  37.25 inches    Waist to Hip Ratio  0.94 %    BMI (Calculated)  20.65        Nutrition Therapy Plan and Nutrition Goals: Nutrition Therapy & Goals - 01/24/18 1226      Intervention Plan   Intervention  Prescribe, educate and counsel regarding individualized specific dietary modifications aiming towards targeted core components such as weight, hypertension, lipid management,  diabetes, heart failure and other comorbidities.    Expected Outcomes  Short Term Goal: Understand basic principles of dietary content, such as calories, fat, sodium, cholesterol and nutrients.;Short Term Goal: A plan has been developed with personal nutrition goals set during dietitian appointment.;Long Term Goal: Adherence to prescribed nutrition plan.       Nutrition Assessments: Nutrition Assessments - 01/24/18 1224      MEDFICTS Scores   Pre Score  38       Nutrition Goals Re-Evaluation: Nutrition Goals Re-Evaluation    [place] 02/15/18 1525             Goals   Current Weight  139 lb (63 kg)       Nutrition Goal  Meet with dietician to gain weight       Comment  Leslie Sims would like to gain weight.  He wants to learn to eat right but still gain some weight.  He has an appointment scheduled to meet with dietician next week.       Expected Outcome  Short: Meet with dietician.  Long: Continue to follow recommendations.           Nutrition Goals Discharge (Final Nutrition Goals Re-Evaluation): Nutrition Goals Re-Evaluation - 02/15/18 1525      Goals   Current Weight  139 lb (63 kg)    Nutrition Goal  Meet with dietician to gain weight    Comment  Leslie Sims would like to gain weight.  He wants to learn to eat right but still gain some weight.  He has an appointment scheduled to meet with dietician next week.    Expected Outcome  Short: Meet with dietician.  Long: Continue to follow recommendations.  Psychosocial: Target Goals: Acknowledge presence or absence of significant depression and/or stress, maximize coping skills, provide positive support system. Participant is able to verbalize types and ability to use techniques and skills needed for reducing stress and depression.   Initial Review & Psychosocial Screening: Initial Psych Review & Screening - 01/24/18 1251      Initial Review   Comments  Leslie Sims states that he tries to get out of the house, away from home,at  least 30 min every day.   He does have  a CAT Scan due soon for followup of nodules found earlier in his chest.  Biopsy did indicate not cancer.       Family Dynamics   Good Support System?  -- Leslie Sims has his 2 dogs at home that are great companions to him       Quality of Life Scores:  Scores of 19 and below usually indicate a poorer quality of life in these areas.  A difference of  2-3 points is a clinically meaningful difference.  A difference of 2-3 points in the total score of the Quality of Life Index has been associated with significant improvement in overall quality of life, self-image, physical symptoms, and general health in studies assessing change in quality of life.  PHQ-9: Recent Review Flowsheet Data    Depression screen [facility] 2/9 01/24/2018 01/09/2018 11/16/2017 10/05/2017 09/07/2017   Decreased Interest 3 3 2 1 2    Down, Depressed, Hopeless 3 3 2 1 2    PHQ - 2 Score 6 6 4 2 4    Altered sleeping 3 3 1  0 2   Tired, decreased energy 3 3 1 1 2    Change in appetite 3 3 2  0 2   Feeling bad or failure about yourself  1 2 1 1 2    Trouble concentrating 3 2 0 0 2   Moving slowly or fidgety/restless 3 0 0 0 0   Suicidal thoughts 0 0 0 0 0   PHQ-9 Score 22 19 9 4 14    Difficult doing work/chores Extremely dIfficult - - - -     Interpretation of Total Score  Total Score Depression Severity:  1-4 = Minimal depression, 5-9 = Mild depression, 10-14 = Moderate depression, 15-19 = Moderately severe depression, 20-27 = Severe depression   Psychosocial Evaluation and Intervention: Psychosocial Evaluation - 02/08/18 1219      Psychosocial Evaluation & Interventions   Comments  Leslie Sims [facility]) has returned to this Pulmonary Rehab program following a hospitalization recently for an upper lung infection.  He is a 67 year old who suffers with multiple health issues including COPD.  Leslie Sims has a strong support system with his daughter close by and his local church family.  He sadly lost  his primary support spouse this past July and is grieving that loss currently.  Leslie Sims reports chronic sleeping problems and has lost his appetite recently.  He has a history of depression and anxiety and reports these have both worsened with the loss of his spouse, brother and sister in the past several years.  He is on medication to help with this and reports it is working "somewhat."  Leslie Sims is attending Grief share support groups with his daughter and states this is helping as well.  Leslie Sims's PHQ-9 is "22" indicating severe depression currently.  Counselor discussed this with him and encouraged a medication evaluation if this doesn't improve significantly in the next few weeks.  Leslie Sims has goals to breathe better; be off the oxygen  during the day and improve his health and be able to return to more normal activities.  He will be followed by staff and this counselor throughout the course of this program.      Expected Outcomes  Leslie Sims will begin to exercise more consistently to help with his goals to breathe better and also to help with his depression/anxiety symptoms.  The educational and psychoeducational components of this program will help him understand and cope more positively with his condition. Leslie Sims will have a medication evaluation to address his mood symptoms in the near future.      Continue Psychosocial Services   Follow up required by counselor       Psychosocial Re-Evaluation:   Psychosocial Discharge (Final Psychosocial Re-Evaluation):   Education: Education Goals: Education classes will be provided on a weekly basis, covering required topics. Participant will state understanding/return demonstration of topics presented.  Learning Barriers/Preferences: Learning Barriers/Preferences - 01/24/18 1237      Learning Barriers/Preferences   Learning Barriers  None    Learning Preferences  Verbal Instruction;Video       Education Topics:  Initial Evaluation Education: -  Verbal, written and demonstration of respiratory meds, oximetry and breathing techniques. Instruction on use of nebulizers and MDIs and importance of monitoring MDI activations.   Pulmonary Rehab from 02/22/2018 in [facility] Cardiac and Pulmonary Rehab  Date  01/24/18  Educator  SB  Instruction Review Code  1- Verbalizes Understanding      General Nutrition Guidelines/Fats and Fiber: -Group instruction provided by verbal, written material, models and posters to present the general guidelines for heart healthy nutrition. Gives an explanation and review of dietary fats and fiber.   Pulmonary Rehab from 04/18/2017 in [facility] Cardiac and Pulmonary Rehab  Date  04/18/17  Educator  CR  Instruction Review Code (retired)  2- meets goals/outcomes      Controlling Sodium/Reading Food Labels: -Group verbal and written material supporting the discussion of sodium use in heart healthy nutrition. Review and explanation with models, verbal and written materials for utilization of the food label.   Exercise Physiology & General Exercise Guidelines: - Group verbal and written instruction with models to review the exercise physiology of the cardiovascular system and associated critical values. Provides general exercise guidelines with specific guidelines to those with heart or lung disease.    Aerobic Exercise & Resistance Training: - Gives group verbal and written instruction on the various components of exercise. Focuses on aerobic and resistive training programs and the benefits of this training and how to safely progress through these programs.   Flexibility, Balance, Mind/Body Relaxation: Provides group verbal/written instruction on the benefits of flexibility and balance training, including mind/body exercise modes such as yoga, pilates and tai chi.  Demonstration and skill practice provided.   Stress and Anxiety: - Provides group verbal and written instruction about the health risks of elevated stress  and causes of high stress.  Discuss the correlation between heart/lung disease and anxiety and treatment options. Review healthy ways to manage with stress and anxiety.   Pulmonary Rehab from 02/22/2018 in [facility] Cardiac and Pulmonary Rehab  Date  02/22/18  Educator  [facility]  Instruction Review Code  1- Verbalizes Understanding      Depression: - Provides group verbal and written instruction on the correlation between heart/lung disease and depressed mood, treatment options, and the stigmas associated with seeking treatment.   Pulmonary Rehab from 02/22/2018 in [facility] Cardiac and Pulmonary Rehab  Date  02/08/18  Educator  [facility]  Instruction Review Code  1- Verbalizes Understanding      Exercise & Equipment Safety: - Individual verbal instruction and demonstration of equipment use and safety with use of the equipment.   Pulmonary Rehab from 02/22/2018 in [facility] Cardiac and Pulmonary Rehab  Date  01/24/18  Educator  Sb  Instruction Review Code  1- Verbalizes Understanding      Infection Prevention: - Provides verbal and written material to individual with discussion of infection control including proper hand washing and proper equipment cleaning during exercise session.   Pulmonary Rehab from 02/22/2018 in [facility] Cardiac and Pulmonary Rehab  Date  01/24/18  Educator  Sb  Instruction Review Code  1- Verbalizes Understanding      Falls Prevention: - Provides verbal and written material to individual with discussion of falls prevention and safety.   Pulmonary Rehab from 02/22/2018 in [facility] Cardiac and Pulmonary Rehab  Date  01/24/18  Educator  SB  Instruction Review Code  1- Verbalizes Understanding      Diabetes: - Individual verbal and written instruction to review signs/symptoms of diabetes, desired ranges of glucose level fasting, after meals and with exercise. Advice that pre and post exercise glucose checks will be done for 3 sessions at entry of program.   Chronic Lung Diseases: - Group  verbal and written instruction to review updates, respiratory medications, advancements in procedures and treatments. Discuss use of supplemental oxygen including available portable oxygen systems, continuous and intermittent flow rates, concentrators, personal use and safety guidelines. Review proper use of inhaler and spacers. Provide informative websites for self-education.    Energy Conservation: - Provide group verbal and written instruction for methods to conserve energy, plan and organize activities. Instruct on pacing techniques, use of adaptive equipment and posture/positioning to relieve shortness of breath.   Pulmonary Rehab from 02/22/2018 in [facility] Cardiac and Pulmonary Rehab  Date  02/15/18  Educator  [facility]  Instruction Review Code  1- Verbalizes Understanding      Triggers and Exacerbations: - Group verbal and written instruction to review types of environmental triggers and ways to prevent exacerbations. Discuss weather changes, air quality and the benefits of nasal washing. Review warning signs and symptoms to help prevent infections. Discuss techniques for effective airway clearance, coughing, and vibrations.   AED/CPR: - Group verbal and written instruction with the use of models to demonstrate the basic use of the AED with the basic ABC's of resuscitation.   Pulmonary Rehab from 02/22/2018 in [facility] Cardiac and Pulmonary Rehab  Date  02/17/18  Educator  [facility]  Instruction Review Code  1- Actuary and Physiology of the Lungs: - Group verbal and written instruction with the use of models to provide basic lung anatomy and physiology related to function, structure and complications of lung disease.   Anatomy & Physiology of the Heart: - Group verbal and written instruction and models provide basic cardiac anatomy and physiology, with the coronary electrical and arterial systems. Review of Valvular disease and Heart Failure   Cardiac Medications: -  Group verbal and written instruction to review commonly prescribed medications for heart disease. Reviews the medication, class of the drug, and side effects.   Pulmonary Rehab from 02/22/2018 in [facility] Cardiac and Pulmonary Rehab  Date  02/03/18  Educator  [facility]  Instruction Review Code  1- Verbalizes Understanding      Know Your Numbers and Risk Factors: -Group verbal and written instruction about important numbers in your health.  Discussion of  what are risk factors and how they play a role in the disease process.  Review of Cholesterol, Blood Pressure, Diabetes, and BMI and the role they play in your overall health.   Sleep Hygiene: -Provides group verbal and written instruction about how sleep can affect your health.  Define sleep hygiene, discuss sleep cycles and impact of sleep habits. Review good sleep hygiene tips.    Other: -Provides group and verbal instruction on various topics (see comments)    Knowledge Questionnaire Score: Knowledge Questionnaire Score - 01/24/18 1237      Knowledge Questionnaire Score   Pre Score  15/18 Reviewed correct responses with Leslie Sims. He verbalized understading of the correct response. He had no questions today        Core Components/Risk Factors/Patient Goals at Admission: Personal Goals and Risk Factors at Admission - 01/24/18 1226      Core Components/Risk Factors/Patient Goals on Admission    Weight Management  Yes    Intervention  Weight Management: Develop a combined nutrition and exercise program designed to reach desired caloric intake, while maintaining appropriate intake of nutrient and fiber, sodium and fats, and appropriate energy expenditure required for the weight goal.;Weight Management: Provide education and appropriate resources to help participant work on and attain dietary goals.;Weight Management/Obesity: Establish reasonable short term and long term weight goals.    Admit Weight  141 lb 12.8 oz (64.3 kg)    Goal Weight: Short  Term  145 lb (65.8 kg)    Goal Weight: Long Term  145 lb (65.8 kg)    Expected Outcomes  Short Term: Continue to assess and modify interventions until short term weight is achieved;Long Term: Adherence to nutrition and physical activity/exercise program aimed toward attainment of established weight goal;Weight Maintenance: Understanding of the daily nutrition guidelines, which includes 25-35% calories from fat, 7% or less cal from saturated fats, less than 263m cholesterol, less than 1.5gm of sodium, & 5 or more servings of fruits and vegetables daily;Understanding recommendations for meals to include 15-35% energy as protein, 25-35% energy from fat, 35-60% energy from carbohydrates, less than 2023mof dietary cholesterol, 20-35 gm of total fiber daily;Understanding of distribution of calorie intake throughout the day with the consumption of 4-5 meals/snacks    Tobacco Cessation  Yes    Intervention  Assist the participant in steps to quit. Provide individualized education and counseling about committing to Tobacco Cessation, relapse prevention, and pharmacological support that can be provided by physician.;OfAdvice workerassist with locating and accessing local/national Quit Smoking programs, and support quit date choice. Given smoking cessation packet. Reviewed need to see the counselor to help him with the smoking cessation journey.    Expected Outcomes  Short Term: Will demonstrate readiness to quit, by selecting a quit date.;Long Term: Complete abstinence from all tobacco products for at least 12 months from quit date.;Short Term: Will quit all tobacco product use, adhering to prevention of relapse plan.    Improve shortness of breath with ADL's  Yes    Intervention  Provide education, individualized exercise plan and daily activity instruction to help decrease symptoms of SOB with activities of daily living.    Expected Outcomes  Short Term: Improve cardiorespiratory fitness to achieve  a reduction of symptoms when performing ADLs;Long Term: Be able to perform more ADLs without symptoms or delay the onset of symptoms    Heart Failure  Yes    Intervention  Provide a combined exercise and nutrition program that is supplemented with education, support  and counseling about heart failure. Directed toward relieving symptoms such as shortness of breath, decreased exercise tolerance, and extremity edema.    Expected Outcomes  Improve functional capacity of life;Short term: Attendance in program 2-3 days a week with increased exercise capacity. Reported lower sodium intake. Reported increased fruit and vegetable intake. Reports medication compliance.;Short term: Daily weights obtained and reported for increase. Utilizing diuretic protocols set by physician.;Long term: Adoption of self-care skills and reduction of barriers for early signs and symptoms recognition and intervention leading to self-care maintenance.    Hypertension  Yes    Intervention  Provide education on lifestyle modifcations including regular physical activity/exercise, weight management, moderate sodium restriction and increased consumption of fresh fruit, vegetables, and low fat dairy, alcohol moderation, and smoking cessation.;Monitor prescription use compliance.    Expected Outcomes  Short Term: Continued assessment and intervention until BP is < 140/58m HG in hypertensive participants. < 130/826mHG in hypertensive participants with diabetes, heart failure or chronic kidney disease.;Long Term: Maintenance of blood pressure at goal levels.    Lipids  Yes    Intervention  Provide education and support for participant on nutrition & aerobic/resistive exercise along with prescribed medications to achieve LDL <7061mHDL >24m53m  Expected Outcomes  Short Term: Participant states understanding of desired cholesterol values and is compliant with medications prescribed. Participant is following exercise prescription and nutrition  guidelines.;Long Term: Cholesterol controlled with medications as prescribed, with individualized exercise RX and with personalized nutrition plan. Value goals: LDL < 70mg5mL > 40 mg.    Stress  Yes    Intervention  Offer individual and/or small group education and counseling on adjustment to heart disease, stress management and health-related lifestyle change. Teach and support self-help strategies.;Refer participants experiencing significant psychosocial distress to appropriate mental health specialists for further evaluation and treatment. When possible, include family members and significant others in education/counseling sessions. Loss of three family members in the past year. One being his wife of 37 ye57s.  Continues to grieve for his wife.Is attending grief counseling program.     Expected Outcomes  Short Term: Participant demonstrates changes in health-related behavior, relaxation and other stress management skills, ability to obtain effective social support, and compliance with psychotropic medications if prescribed.;Long Term: Emotional wellbeing is indicated by absence of clinically significant psychosocial distress or social isolation.       Core Components/Risk Factors/Patient Goals Review:  Goals and Risk Factor Review    Row Name 02/15/18 1514             Core Components/Risk Factors/Patient Goals Review   Personal Goals Review  Weight Management/Obesity;Heart Failure;Hypertension;Tobacco Cessation;Lipids;Improve shortness of breath with ADL's       Review  Leslie Sims doing well in rehab.  His weight has been holding steady but he would like to gain weight.  His goal is to get back up to 145 lbs but he wants to build up muscle.  He has an appointment with the dietician during class next week to help with weight gain.  He is doing a little better with his shortness of breath but there are some days that it is worse than others.   He continues to work on his heart failure and  has not had any symptoms.  He monitors his blood pressures at home at least 3-4x a week and has it checked in class as well.  He is doing well on his medications.   For tobacco cessation he is getting better.  He is staying between 6-18 cigarettes a day versus a full pack.  He is also set up to start a one on one smoking cessation class since the current classes were already in progress and did not restart until April.  He has strated to make the right steps.  We talked a little about combination therapy as he would not like to take Chantix again.  I also gave him a fake cigarette to hold and use in lieu of a real one.  We will continue to monitor his tobacco use.       Expected Outcomes  Short: Start smoking cessation classes.  Long: Continue to work on weight gain.           Core Components/Risk Factors/Patient Goals at Discharge (Final Review):  Goals and Risk Factor Review - 02/15/18 1514      Core Components/Risk Factors/Patient Goals Review   Personal Goals Review  Weight Management/Obesity;Heart Failure;Hypertension;Tobacco Cessation;Lipids;Improve shortness of breath with ADL's    Review  Leslie Sims has been doing well in rehab.  His weight has been holding steady but he would like to gain weight.  His goal is to get back up to 145 lbs but he wants to build up muscle.  He has an appointment with the dietician during class next week to help with weight gain.  He is doing a little better with his shortness of breath but there are some days that it is worse than others.   He continues to work on his heart failure and has not had any symptoms.  He monitors his blood pressures at home at least 3-4x a week and has it checked in class as well.  He is doing well on his medications.   For tobacco cessation he is getting better.  He is staying between 6-18 cigarettes a day versus a full pack.  He is also set up to start a one on one smoking cessation class since the current classes were already in progress and  did not restart until April.  He has strated to make the right steps.  We talked a little about combination therapy as he would not like to take Chantix again.  I also gave him a fake cigarette to hold and use in lieu of a real one.  We will continue to monitor his tobacco use.    Expected Outcomes  Short: Start smoking cessation classes.  Long: Continue to work on weight gain.        ITP Comments: ITP Comments    Row Name 01/24/18 1218 01/30/18 0936 02/24/18 1227 02/27/18 0838     ITP Comments  Medical review completed today. ITP to Dr Sabra Heck for review, changes as needed and signature.   Documentation of diagnosis can be found in [facility] Encounter 12/25/2017.  New admission. Did not complete program last year, because his wife became very sick and died in 2023/08/09.   30 day review completed. ITP sent to Dr. Emily Filbert Director of [place]. Continue with ITP unless changes are made by physician.  Leslie Sims's daughter delivered his Nicotine patches during class. He is very motivated to quit smoking and has an appointment with a [employer] rep in a couple weeks   30 day review completed. ITP sent to Dr. Emily Filbert Director of [place]. Continue with ITP unless changes are made by physician.       Comments: 30 day review

## 2018-03-01 ENCOUNTER — Encounter: Payer: BLUE CROSS/BLUE SHIELD | Admitting: *Deleted

## 2018-03-01 DIAGNOSIS — J449 Chronic obstructive pulmonary disease, unspecified: Secondary | ICD-10-CM | POA: Diagnosis not present

## 2018-03-01 NOTE — Progress Notes (Signed)
Daily Session Note  Patient Details  Name: Leslie Sims MRN: [id number] Date of Birth: July 16, 1951 Referring Provider:     Pulmonary Rehab from 01/24/2018 in [facility] Cardiac and Pulmonary Rehab  Referring Provider  [name]      Encounter Date: 03/01/2018  Check In: Session Check In - 03/01/18 1127      Check-In   Location  [facility]-Cardiac & Pulmonary Rehab    Staff Present  Alberteen Sam, MA, RCEP, CCRP, Exercise Physiologist;Ashe Graybeal Sherryll Burger, RN BSN;Joseph Flavia Shipper    Supervising physician immediately available to respond to emergencies  [facility] immediately available ER MD    Physician(s)  Dr. Reita Cliche and Alfred Levins    Medication changes reported      No    Fall or balance concerns reported     No    Warm-up and Cool-down  Performed as group-led instruction    Resistance Training Performed  Yes    VAD Patient?  No      Pain Assessment   Currently in Pain?  No/denies        Exercise Prescription Changes - 02/28/18 1400      Response to Exercise   Blood Pressure (Admit)  124/70    Blood Pressure (Exit)  106/58    Heart Rate (Admit)  98 bpm    Heart Rate (Exercise)  121 bpm    Heart Rate (Exit)  102 bpm    Oxygen Saturation (Admit)  96 %    Oxygen Saturation (Exercise)  94 %    Oxygen Saturation (Exit)  95 %    Rating of Perceived Exertion (Exercise)  12    Perceived Dyspnea (Exercise)  1    Symptoms  none    Duration  Continue with 45 min of aerobic exercise without signs/symptoms of physical distress.    Intensity  THRR unchanged      Progression   Progression  Continue to progress workloads to maintain intensity without signs/symptoms of physical distress.    Average METs  2.82      Resistance Training   Training Prescription  Yes    Weight  5 lbs    Reps  10-15      Interval Training   Interval Training  No      Oxygen   Oxygen  Continuous    Liters  2 3L as needed       Treadmill   MPH  2.5    Grade  0    Minutes  15    METs  2.91      Recumbant Bike   Level  4    RPM  64    Watts  29    Minutes  15    METs  3.84      T5 Nustep   Level  4    SPM  82    Minutes  15    METs  2.1      Home Exercise Plan   Plans to continue exercise at  Home (comment) walking, ellipitical    Frequency  Add 2 additional days to program exercise sessions.    Initial Home Exercises Provided  02/15/18       Social History   Tobacco Use  Smoking Status Current Every Day Smoker  . Packs/day: 0.50  . Years: 42.00  . Pack years: 21.00  . Types: Cigarettes  Smokeless Tobacco Never Used  Tobacco Comment   02/15/18 For tobacco cessation he is getting better.  He is staying  between 6-18 cigarettes a day versus a full pack.  He is also set up to start a one on one smoking cessation class since the current classes were already in progress and did not restart un    Goals Met:  Proper associated with RPD/PD & O2 Sat Independence with exercise equipment Using PLB without cueing & demonstrates good technique Exercise tolerated well Strength training completed today  Goals Unmet:  Not Applicable  Comments: Pt able to follow exercise prescription today without complaint.  Will continue to monitor for progression.    Dr. Emily Filbert is Medical Director for [place] and [facility].

## 2018-03-03 ENCOUNTER — Other Ambulatory Visit: Payer: Self-pay

## 2018-03-03 ENCOUNTER — Ambulatory Visit: Payer: BLUE CROSS/BLUE SHIELD | Admitting: Family Medicine

## 2018-03-03 ENCOUNTER — Encounter: Payer: Self-pay | Admitting: Family Medicine

## 2018-03-03 VITALS — BP 102/63 | HR 118 | Temp 98.7°F | Resp 16 | Wt 142.0 lb

## 2018-03-03 DIAGNOSIS — F17200 Nicotine dependence, unspecified, uncomplicated: Secondary | ICD-10-CM | POA: Diagnosis not present

## 2018-03-03 DIAGNOSIS — Z716 Tobacco abuse counseling: Secondary | ICD-10-CM

## 2018-03-03 DIAGNOSIS — R0902 Hypoxemia: Secondary | ICD-10-CM

## 2018-03-03 DIAGNOSIS — F4321 Adjustment disorder with depressed mood: Secondary | ICD-10-CM | POA: Diagnosis not present

## 2018-03-03 DIAGNOSIS — J449 Chronic obstructive pulmonary disease, unspecified: Secondary | ICD-10-CM

## 2018-03-03 DIAGNOSIS — K219 Gastro-esophageal reflux disease without esophagitis: Secondary | ICD-10-CM | POA: Diagnosis not present

## 2018-03-03 DIAGNOSIS — E7800 Pure hypercholesterolemia, unspecified: Secondary | ICD-10-CM | POA: Diagnosis not present

## 2018-03-03 DIAGNOSIS — F341 Dysthymic disorder: Secondary | ICD-10-CM

## 2018-03-03 MED ORDER — FLUOXETINE HCL 20 MG PO TABS: 20.0000 mg | ORAL_TABLET | Freq: Every day | ORAL | 3 refills | Status: DC

## 2018-03-03 MED ORDER — ATORVASTATIN CALCIUM 40 MG PO TABS: 40.0000 mg | ORAL_TABLET | Freq: Every day | ORAL | 3 refills | Status: DC

## 2018-03-03 MED ORDER — SUCRALFATE 1 GM/10ML PO SUSP: 1.0000 g | Freq: Three times a day (TID) | ORAL | 4 refills | Status: DC

## 2018-03-03 NOTE — Progress Notes (Signed)
Subjective:    Patient ID: Leslie Sims, male    DOB: Oct 13, 1951, 67 y.o.   MRN: [id number]  03/03/2018  Depression (2 month follow-up ) and Anxiety    HPI This 67 y.o. male presents for TWO MONTH FOLLOW-UP evaluation of anxiety and depression.  Started nicotine patch for eight days.  On the night of the 18th, no cigarettes the night of the 18th for upcoming appointment on the 19th.  Shawn called today to check on patient.  First meeting for smoking cessation. Meets in cafeteria of [facility].  Went to pulmonary rehab today; ate biscuit and glass of tea.  Treadmill for 15 minutes at 2.86mh.  Then did new step for 15 minutes.  Then another machine pedaling for 15 minutes.  Hard workout.  Did not want to go this morning but made self go.  Knew it would be four days without rehab.  Met buddies.  [facility]  Has not done anything in six months. Has increased on two machines; increase speed to 3 and on 4.  Oxygen is on 92-94% on 2 liters nasal cannula.  Goal is to feel better and to get rid of this tank.  Appointment with nutritionist next Wednesday after class.    Anxiety and depression: attending grief classes every Monday night. JRoselyn Reefonly goes with patient.  Went to grief class without JRoselyn Reef  On-call nurse wanted to talk with PCP; anxiety has worsened; lacking motivation; does not want to be alone. Scares to get out alone. Isolating self. Never comfortable. Sleeping way too much.  Got up at 5:00am; went to bathroom. Called at 9:00am to make sure up.  Bedtime 9:30-10:00.  Naps during the day for 1-3 hours.  No SI. Enjoys church; going every Sunday.   Zoloft/Sertraline 1086mqhs and 5029mvery morning. Mirtazipine 58m81m bedtime.No insomnia. Lorazepam 1mg 85m2 every morning 1 every day 1/2 late afternoon.  No cymbalta. No effexor. No wellbutrin. No paxil.     BP Readings from Last 3 Encounters:  03/03/18 102/63  02/10/18 112/60  02/06/18 110/75   Wt Readings from  Last 3 Encounters:  03/03/18 142 lb (64.4 kg)  02/10/18 142 lb (64.4 kg)  02/06/18 139 lb 8 oz (63.3 kg)   Immunization History  Administered Date(s) Administered  . Influenza Whole 09/19/2012  . Influenza,inj,Quad PF,6+ Mos 10/24/2013, 11/25/2014, 03/02/2016, 10/27/2016, 10/05/2017  . Pneumococcal Conjugate-13 11/25/2014  . Pneumococcal Polysaccharide-23 08/24/2016, 10/27/2016  . Pneumococcal-Unspecified 12/20/2008  . Tdap 12/20/2008    Review of Systems  Constitutional: Negative for activity change, appetite change, chills, diaphoresis, fatigue and fever.  Respiratory: Positive for shortness of breath. Negative for cough.   Cardiovascular: Negative for chest pain, palpitations and leg swelling.  Gastrointestinal: Negative for abdominal pain, diarrhea, nausea and vomiting.  Endocrine: Negative for cold intolerance, heat intolerance, polydipsia, polyphagia and polyuria.  Skin: Negative for color change, rash and wound.  Neurological: Positive for tremors. Negative for dizziness, seizures, syncope, facial asymmetry, speech difficulty, weakness, light-headedness, numbness and headaches.  Psychiatric/Behavioral: Positive for dysphoric mood. Negative for self-injury, sleep disturbance and suicidal ideas. The patient is nervous/anxious.     Past Medical History:  Diagnosis Date  . Allergic rhinitis   . Anxiety   . Atherosclerosis   . Back pain   . Bronchitis    09-14-11  . CARDIOMYOPATHY 03/24/2010   did not tolerate Toprol and Lisinopril.   . Chest pain   . CHF (congestive heart failure) (HCC) [place]  Chronic airway obstruction, not elsewhere classified   . Colon polyps 08/2012   colonoscopy; multiple colon polyps; repeat colonoscopy in one year.  [name] COPD (chronic obstructive pulmonary disease) ([place])   . Coronary artery disease 11/2009   Anterior MI with late presentation 11/2009. Cath showed a 100% proximal LAD occlusion, 99% mid RCA. Had a PCI and 2 DES to both LAD and RCA. EF was  35%. Nuclear stress test 05/13: prior anterior/inferior infarcts without ischemic, EF 43%, cardiac cath in 02/14: Widely patent stents with no other obstructive disease. EF 40%   . Depression   . GERD (gastroesophageal reflux disease)   . Headache(784.0)   . Helicobacter pylori (H. pylori)   . Hemorrhoids   . Hernia    inguinal  . Hyperlipidemia   . Hypertension   . Insomnia   . MI (myocardial infarction) ([place])   . Myalgia   . Peptic ulcer   . Shortness of breath dyspnea   . Status post dilation of esophageal narrowing 2000  . Thyroid dysfunction   . Tinea pedis   . Wears dentures    partial upper   Past Surgical History:  Procedure Laterality Date  . Admission  12/20/2012   COPD exacerbation.  [place].  [name] CARDIAC CATHETERIZATION    . CARDIAC CATHETERIZATION  02/05/13   [facility]  . CARDIAC CATHETERIZATION  10/14   [facility] : patent stents with no change in anatomy. EF: 40$  . CARDIAC CATHETERIZATION Left 11/12/2016   Procedure: Left Heart Cath and Coronary Angiography;  Surgeon: Wellington Hampshire, MD;  Location: [place];  Service: Cardiovascular;  Laterality: Left;  . COLONOSCOPY    . COLONOSCOPY WITH PROPOFOL N/A 05/18/2016   Procedure: COLONOSCOPY WITH PROPOFOL;  Surgeon: Lucilla Lame, MD;  Location: [facility] ENDOSCOPY;  Service: Endoscopy;  Laterality: N/A;  . CORONARY ANGIOPLASTY WITH STENT PLACEMENT  09/2009   LAD 3.0 X23 mm Xience DES, RCA: 4.0 X 15 mm Xience DES  . ELECTROMAGNETIC NAVIGATION BROCHOSCOPY N/A 10/27/2017   Procedure: ELECTROMAGNETIC NAVIGATION BRONCHOSCOPY;  Surgeon: Flora Lipps, MD;  Location: [facility] ORS;  Service: Cardiopulmonary;  Laterality: N/A;  . ESOPHAGEAL DILATION    . ESOPHAGOGASTRODUODENOSCOPY  2008  . ESOPHAGOGASTRODUODENOSCOPY  08/20/2012  . HERNIA REPAIR  07/20/2012   L inguinal hernia repair  . PENILE PROSTHESIS IMPLANT     Allergies  Allergen Reactions  . Effexor Xr [Venlafaxine Hcl Er]   . Wellbutrin [Bupropion] Other (See Comments)     Unknown/"made me feel real funny"   Current Outpatient Medications on File Prior to Visit  Medication Sig Dispense Refill  . albuterol (VENTOLIN HFA) 108 (90 Base) MCG/ACT inhaler USE 2 INHALATIONS EVERY 6 HOURS AS NEEDED FOR WHEEZING 48 g 3  . aspirin 81 MG tablet Take 81 mg by mouth daily.      . Fluticasone-Salmeterol (ADVAIR DISKUS) 500-50 MCG/DOSE AEPB Inhale 1 puff into the lungs 2 (two) times daily. 180 each 3  . ipratropium-albuterol (DUONEB) 0.5-2.5 (3) MG/3ML SOLN Take 3 mLs by nebulization every 6 (six) hours as needed. 225 mL 3  . ketoconazole (NIZORAL) 2 % cream Apply 1 application topically 2 (two) times daily. 60 g 0  . losartan (COZAAR) 25 MG tablet TAKE 1 TABLET DAILY 90 tablet 3  . mirtazapine (REMERON) 15 MG tablet Take 1 tablet (15 mg total) by mouth at bedtime. 30 tablet 5  . Multiple Vitamin (MULTIVITAMIN) capsule Take 1 capsule by mouth daily.    . nitroGLYCERIN (NITROSTAT) 0.4 MG SL  tablet Place 1 tablet (0.4 mg total) under the tongue every 5 (five) minutes as needed. 25 tablet 3  . sertraline (ZOLOFT) 100 MG tablet Take 1 tablet (100 mg total) by mouth at bedtime. 90 tablet 1  . tiotropium (SPIRIVA HANDIHALER) 18 MCG inhalation capsule Place 1 capsule (18 mcg total) into inhaler and inhale daily. 90 capsule 3  . furosemide (LASIX) 20 MG tablet Take 1 tablet (20 mg total) by mouth daily. 30 tablet 0   No current facility-administered medications on file prior to visit.    Social History   Socioeconomic History  . Marital status: Widowed    Spouse name: Not on file  . Number of children: 5  . Years of education: Not on file  . Highest education level: Not on file  Occupational History  . 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 Financial resource strain: Not on file  . Food insecurity:    Worry: Not on file    Inability: Not on file  . Transportation needs:    Medical: Not on file    Non-medical: Not on file    Tobacco Use  . Smoking status: Current Every Day Smoker    Packs/day: 0.50    Years: 42.00    Pack years: 21.00    Types: Cigarettes  . Smokeless tobacco: Never Used  . Tobacco comment: 02/15/18 For tobacco cessation he is getting better.  He is staying between 6-18 cigarettes a day versus a full pack.  He is also set up to start a one on one smoking cessation class since the current classes were already in progress and did not restart un  Substance and Sexual Activity  . Alcohol use: No    Alcohol/week: 0.0 oz  . Drug use: No  . Sexual activity: Yes  Lifestyle  . Physical activity:    Days per week: Not on file    Minutes per session: Not on file  . Stress: Not on file  Relationships  . Social connections:    Talks on phone: Not on file    Gets together: Not on file    Attends religious service: Not on file    Active member of club or organization: Not on file    Attends meetings of clubs or organizations: Not on file    Relationship status: Not on file  . Intimate partner violence:    Fear of current or ex partner: Not on file    Emotionally abused: Not on file    Physically abused: Not on file    Forced sexual activity: Not on file  Other Topics Concern  . Not on file  Social History Narrative   Marital status: married x 32 years; happily married.      Children: 4 children;  1 stepchild and 15 grandchildren; no great grandchildren.      Lives: with wife; 2 dogs, 1 cat.      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      Tobacco: 1 ppd x 45 years      Alcohol: none; previous alcoholism      Drugs: none      Exercise: no formal exercise; physically demanding job; owns horses.      Seatbelts:  100%      Guns: one loaded unsecured gun in the home.      Sunscreen: none   Family History  Problem Relation Age of Onset  . Heart  attack Brother        Brother #1  . Diabetes Brother   . Hypertension Brother        #3  . Coronary artery disease Father 56        deceased  . Heart attack Father   . Diabetes Father   . Heart disease Father   . COPD Mother 31       deceased  . Alcohol abuse Sister        polysubstance abuse  . COPD Sister   . Lung cancer Sister   . Alcohol abuse Sister        polysubstance abuse  . Penile cancer Brother   . Diabetes Brother        Objective:    BP 102/63   Pulse (!) 118   Temp 98.7 F (37.1 C) (Oral)   Resp 16   Wt 142 lb (64.4 kg)   SpO2 95%   BMI 20.67 kg/m  Physical Exam  Constitutional: He is oriented to person, place, and time. He appears well-developed and well-nourished. No distress.  HENT:  Head: Normocephalic and atraumatic.  Right Ear: External ear normal.  Left Ear: External ear normal.  Nose: Nose normal.  Mouth/Throat: Oropharynx is clear and moist.  Eyes: Conjunctivae and EOM are normal. Pupils are equal, round, and reactive to light.  Neck: Normal range of motion. Neck supple. Carotid bruit is not present. No thyromegaly present.  Cardiovascular: Normal rate, regular rhythm, normal heart sounds and intact distal pulses. Exam reveals no gallop and no friction rub.  No murmur heard. Pulmonary/Chest: Effort normal and breath sounds normal. He has no wheezes. He has no rales.  Abdominal: Soft. Bowel sounds are normal. He exhibits no distension and no mass. There is no tenderness. There is no rebound and no guarding.  Lymphadenopathy:    He has no cervical adenopathy.  Neurological: He is alert and oriented to person, place, and time. No cranial nerve deficit. He exhibits normal muscle tone. Coordination normal.  Skin: Skin is warm and dry. No rash noted. He is not diaphoretic.  Psychiatric: His speech is normal and behavior is normal. Judgment and thought content normal. His mood appears anxious. Cognition and memory are normal. He exhibits a depressed mood.  Nursing note and vitals reviewed.  No results found. Depression screen [facility] 2/9 03/03/2018 01/24/2018 01/09/2018 11/16/2017  10/05/2017  Decreased Interest 3 3 3 2 1   Down, Depressed, Hopeless 3 3 3 2 1   PHQ - 2 Score 6 6 6 4 2   Altered sleeping 3 3 3 1  0  Tired, decreased energy 3 3 3 1 1   Change in appetite 1 3 3 2  0  Feeling bad or failure about yourself  1 1 2 1 1   Trouble concentrating 1 3 2  0 0  Moving slowly or fidgety/restless 1 3 0 0 0  Suicidal thoughts 1 0 0 0 0  PHQ-9 Score 17 22 19 9 4   Difficult doing work/chores - Extremely dIfficult - - -  Some recent data might be hidden   Fall Risk  03/03/2018 01/24/2018 01/09/2018 11/16/2017 10/05/2017  Falls in the past year? No No No No No        Assessment & Plan:   1. DEPRESSION/ANXIETY   2. Grief reaction   3. COPD with hypoxia ([place])   4. SMOKER   5. Encounter for smoking cessation counseling   6. Gastroesophageal reflux disease without esophagitis   7. Pure hypercholesterolemia  Worsening anxiety and depression despite current treatment regimen and grief counseling.  Refer to psychology and psychiatry.  Wean Zoloft over the upcoming month and initiate fluoxetine 20 mg daily.  Suffering with hypersomnolence, thus stop mirtazapine at bedtime.  Continue lorazepam at current dose.  COPD severe: Stable.  Engaging in pulmonary rehab which is excellent for physical conditioning and anxiety and depression management.  Also engaged in smoking cessation program.  Encouraged cessation.  Orders Placed This Encounter  Procedures  . Ambulatory referral to Psychology    Referral Priority:   Routine    Referral Type:   Psychiatric    Referral Reason:   Specialty Services Required    Requested Specialty:   Psychology    Number of Visits Requested:   1  . Ambulatory referral to Psychiatry    Referral Priority:   Routine    Referral Type:   Psychiatric    Referral Reason:   Specialty Services Required    Requested Specialty:   Psychiatry    Number of Visits Requested:   1   Meds ordered this encounter  Medications  . FLUoxetine (PROZAC) 20 MG  tablet    Sig: Take 1 tablet (20 mg total) by mouth daily.    Dispense:  30 tablet    Refill:  3  . atorvastatin (LIPITOR) 40 MG tablet    Sig: Take 1 tablet (40 mg total) by mouth daily.    Dispense:  90 tablet    Refill:  3  . sucralfate (CARAFATE) 1 GM/10ML suspension    Sig: Take 10 mLs (1 g total) by mouth 4 (four) times daily -  with meals and at bedtime.    Dispense:  420 mL    Refill:  4    Return in about 6 weeks (around 04/14/2018) for recheck.   Cait Locust Elayne Guerin, M.D. Primary Care at [facility] [address] [place], [place]  [address] [phone] phone [phone] fax

## 2018-03-03 NOTE — Patient Instructions (Addendum)
    STOP REMERON/MIRTAZIPINE AT BEDTIME. STOP ZOLOFT 50MG  EVERY MORNING. CONTINUE ZOLOFT 100MG  EVERY NIGHT. START PROZAC 20MG  EVERY MORNING. CONTINUE LORAZEPAM AT CURRENT DOSE.   IF you received an x-ray today, you will receive an invoice from [facility]. Please contact [facility] at [phone] with questions or concerns regarding your invoice.   IF you received labwork today, you will receive an invoice from [facility]. Please contact [facility] at [phone] with questions or concerns regarding your invoice.   Our billing staff will not be able to assist you with questions regarding bills from these companies.  You will be contacted with the lab results as soon as they are available. The fastest way to get your results is to activate your My Chart account. Instructions are located on the last page of this paperwork. If you have not heard from [place] regarding the results in 2 weeks, please contact this office.

## 2018-03-03 NOTE — Progress Notes (Signed)
Daily Session Note  Patient Details  Name: Leslie Sims MRN: [id number] Date of Birth: 03-02-1951 Referring Provider:     Pulmonary Rehab from 01/24/2018 in [facility] Cardiac and Pulmonary Rehab  Referring Provider  [name]      Encounter Date: 03/03/2018  Check In: Session Check In - 03/03/18 1128      Check-In   Location  [facility]-Cardiac & Pulmonary Rehab    Staff Present  Renita Papa, RN BSN;Mandi [place], BS, PEC;Normand Damron [place]    Supervising physician immediately available to respond to emergencies  [facility] immediately available ER MD    Physician(s)   Dr. Burlene Arnt and Corky Downs    Medication changes reported      No    Fall or balance concerns reported     No    Tobacco Cessation  No Change    Warm-up and Cool-down  Performed as group-led instruction    Resistance Training Performed  Yes    VAD Patient?  No      Pain Assessment   Currently in Pain?  No/denies          Social History   Tobacco Use  Smoking Status Current Every Day Smoker  . Packs/day: 0.50  . Years: 42.00  . Pack years: 21.00  . Types: Cigarettes  Smokeless Tobacco Never Used  Tobacco Comment   02/15/18 For tobacco cessation he is getting better.  He is staying between 6-18 cigarettes a day versus a full pack.  He is also set up to start a one on one smoking cessation class since the current classes were already in progress and did not restart un    Goals Met:  Independence with exercise equipment Exercise tolerated well Personal goals reviewed No report of cardiac concerns or symptoms Strength training completed today  Goals Unmet:  Not Applicable  Comments: Pt able to follow exercise prescription today without complaint.  Will continue to monitor for progression.   Dr. Emily Filbert is Medical Director for Leslie Sims and [facility].

## 2018-03-06 DIAGNOSIS — J449 Chronic obstructive pulmonary disease, unspecified: Secondary | ICD-10-CM | POA: Diagnosis not present

## 2018-03-06 NOTE — Progress Notes (Signed)
Daily Session Note  Patient Details  Name: Leslie Sims MRN: [id number] Date of Birth: 1951-08-22 Referring Provider:     Pulmonary Rehab from 01/24/2018 in [facility] Cardiac and Pulmonary Rehab  Referring Provider  [name]      Encounter Date: 03/06/2018  Check In: Session Check In - 03/06/18 1131      Check-In   Location  [facility]-Cardiac & Pulmonary Rehab    Staff Present  Earlean Shawl, BS, ACSM CEP, Exercise Physiologist;Amanda Oletta Darter, BA, ACSM CEP, Exercise Physiologist;Germani Gavilanes Flavia Shipper    Supervising physician immediately available to respond to emergencies  [facility] immediately available ER MD    Physician(s)   Dr. Burlene Arnt and Corky Downs    Medication changes reported      No    Fall or balance concerns reported     No    Tobacco Cessation  No Change    Warm-up and Cool-down  Performed as group-led instruction    Resistance Training Performed  Yes    VAD Patient?  No      Pain Assessment   Currently in Pain?  No/denies          Social History   Tobacco Use  Smoking Status Current Every Day Smoker  . Packs/day: 0.50  . Years: 42.00  . Pack years: 21.00  . Types: Cigarettes  Smokeless Tobacco Never Used  Tobacco Comment   02/15/18 For tobacco cessation he is getting better.  He is staying between 6-18 cigarettes a day versus a full pack.  He is also set up to start a one on one smoking cessation class since the current classes were already in progress and did not restart un    Goals Met:  Independence with exercise equipment Exercise tolerated well No report of cardiac concerns or symptoms Strength training completed today  Goals Unmet:  Not Applicable  Comments: Pt able to follow exercise prescription today without complaint.  Will continue to monitor for progression.   Dr. Emily Filbert is Medical Director for [place] and [facility].

## 2018-03-07 ENCOUNTER — Telehealth: Payer: Self-pay | Admitting: Pulmonary Disease

## 2018-03-07 NOTE — Telephone Encounter (Signed)
Patient wants to check status of o2 portable concentrator He will stop by office in the morning on the way to rehab

## 2018-03-08 DIAGNOSIS — J449 Chronic obstructive pulmonary disease, unspecified: Secondary | ICD-10-CM

## 2018-03-08 NOTE — Progress Notes (Signed)
Daily Session Note  Patient Details  Name: Leslie Sims MRN: [id number] Date of Birth: 10/11/1951 Referring Provider:     Pulmonary Rehab from 01/24/2018 in [facility] Cardiac and Pulmonary Rehab  Referring Provider  [name]      Encounter Date: 03/08/2018  Check In: Session Check In - 03/08/18 1129      Check-In   Location  [facility]-Cardiac & Pulmonary Rehab    Staff Present  Meredith Craven, RN BSN;Joseph Hood RCP,RRT,BSRT;Jessica Hawkins, MA, RCEP, CCRP, Exercise Physiologist    Supervising physician immediately available to respond to emergencies  [facility] immediately available ER MD    Physician(s)  Dr. Stafford and Williams    Medication changes reported      No    Fall or balance concerns reported     No    Tobacco Cessation  No Change    Warm-up and Cool-down  Performed as group-led instruction    Resistance Training Performed  Yes    VAD Patient?  No      Pain Assessment   Currently in Pain?  No/denies          Social History   Tobacco Use  Smoking Status Current Every Day Smoker  . Packs/day: 0.50  . Years: 42.00  . Pack years: 21.00  . Types: Cigarettes  Smokeless Tobacco Never Used  Tobacco Comment   02/15/18 For tobacco cessation he is getting better.  He is staying between 6-18 cigarettes a day versus a full pack.  He is also set up to start a one on one smoking cessation class since the current classes were already in progress and did not restart un    Goals Met:  Independence with exercise equipment Exercise tolerated well No report of cardiac concerns or symptoms Strength training completed today  Goals Unmet:  Not Applicable  Comments: Pt able to follow exercise prescription today without complaint.  Will continue to monitor for progression.   Dr. Mark Miller is Medical Director for [facility] and [facility]. 

## 2018-03-08 NOTE — Telephone Encounter (Signed)
Called and spoke with Kern Reap at [facility]. Order was received on 02/13/18. Kern Reap to research and return my call. Rhonda J Cobb

## 2018-03-08 NOTE — Telephone Encounter (Signed)
[facility] has a check list for POC's, one item on check list is how many tanks patient has used/ordered which will indicate how much pt is using o2.  Leslie Sims has only used 11 tanks thus far, therefore, pt not using 02 enough to warrant POC.   Pt is aware and in 3 months will re-evaluate for POC. Rhonda J Cobb

## 2018-03-08 NOTE — Telephone Encounter (Signed)
LMOAM for pt to return my call. Rhonda J Cobb ° °

## 2018-03-08 NOTE — Telephone Encounter (Signed)
Lorna with [facility] called and spoke with patient about this order. Waiting to hear back from [facility]. Rhonda J Cobb

## 2018-03-08 NOTE — Telephone Encounter (Signed)
Pt had order placed on 02/10/18 and is asking if we know the status of his POC.

## 2018-03-09 NOTE — Telephone Encounter (Signed)
Pt is currently not approved for POC but [facility] will attempt to send through the referral again in 3 months. Spoke with patient and he voiced understanding and is aware of the above. Rhonda J Cobb

## 2018-03-13 DIAGNOSIS — J449 Chronic obstructive pulmonary disease, unspecified: Secondary | ICD-10-CM

## 2018-03-13 NOTE — Progress Notes (Signed)
Daily Session Note  Patient Details  Name: Leslie Sims MRN: [id number] Date of Birth: 01/31/1951 Referring Provider:     Pulmonary Rehab from 01/24/2018 in [facility]  Referring Provider  [name]      Encounter Date: 03/13/2018  Check In: Session Check In - 03/13/18 1123      Check-In   Location  [facility]    Staff Present  Kelly Hayes, BS, ACSM CEP, Exercise Physiologist;Amanda Sommer, BA, ACSM CEP, Exercise Physiologist;  RCP,RRT,BSRT    Supervising physician immediately available to respond to emergencies  [facility] immediately available ER MD    Physician(s)  Dr. Veronese and Siadecki    Medication changes reported      No    Fall or balance concerns reported     No    Tobacco Cessation  No Change    Warm-up and Cool-down  Performed as group-led instruction    Resistance Training Performed  Yes    VAD Patient?  No      Pain Assessment   Currently in Pain?  No/denies          Social History   Tobacco Use  Smoking Status Current Every Day Smoker  . Packs/day: 0.50  . Years: 42.00  . Pack years: 21.00  . Types: Cigarettes  Smokeless Tobacco Never Used  Tobacco Comment   02/15/18 For tobacco cessation he is getting better.  He is staying between 6-18 cigarettes a day versus a full pack.  He is also set up to start a one on one smoking cessation class since the current classes were already in progress and did not restart un    Goals Met:  Independence with exercise equipment Exercise tolerated well No report of cardiac concerns or symptoms Strength training completed today  Goals Unmet:  Not Applicable  Comments: Pt able to follow exercise prescription today without complaint.  Will continue to monitor for progression.   Dr. Mark Miller is Medical Director for [facility] and [facility]. 

## 2018-03-14 ENCOUNTER — Ambulatory Visit: Payer: BLUE CROSS/BLUE SHIELD | Admitting: Psychology

## 2018-03-14 DIAGNOSIS — F411 Generalized anxiety disorder: Secondary | ICD-10-CM

## 2018-03-14 DIAGNOSIS — F331 Major depressive disorder, recurrent, moderate: Secondary | ICD-10-CM

## 2018-03-15 ENCOUNTER — Encounter: Payer: BLUE CROSS/BLUE SHIELD | Admitting: *Deleted

## 2018-03-15 ENCOUNTER — Other Ambulatory Visit: Payer: Self-pay | Admitting: Family Medicine

## 2018-03-15 DIAGNOSIS — J449 Chronic obstructive pulmonary disease, unspecified: Secondary | ICD-10-CM | POA: Diagnosis not present

## 2018-03-15 NOTE — Progress Notes (Signed)
Daily Session Note  Patient Details  Name: Leslie Sims MRN: [id number] Date of Birth: 1951-12-13 Referring Provider:     Pulmonary Rehab from 01/24/2018 in [facility]  Referring Provider  [name]      Encounter Date: 03/15/2018  Check In: Session Check In - 03/15/18 1127      Check-In   Location  [facility]    Staff Present  Alberteen Sam, MA, RCEP, CCRP, Exercise Physiologist;Josanne Boerema Sherryll Burger, RN BSN;Joseph Flavia Shipper    Supervising physician immediately available to respond to emergencies  [facility] immediately available ER MD    Physician(s)  Dr. Mariea Clonts and Jimmye Norman     Medication changes reported      No    Fall or balance concerns reported     No    Warm-up and Cool-down  Performed as group-led instruction    Resistance Training Performed  Yes    VAD Patient?  No      Pain Assessment   Currently in Pain?  No/denies          Social History   Tobacco Use  Smoking Status Current Every Day Smoker  . Packs/day: 0.50  . Years: 42.00  . Pack years: 21.00  . Types: Cigarettes  Smokeless Tobacco Never Used  Tobacco Comment   02/15/18 For tobacco cessation he is getting better.  He is staying between 6-18 cigarettes a day versus a full pack.  He is also set up to start a one on one smoking cessation class since the current classes were already in progress and did not restart un    Goals Met:  Proper associated with RPD/PD & O2 Sat Independence with exercise equipment Using PLB without cueing & demonstrates good technique Exercise tolerated well Strength training completed today  Goals Unmet:  Not Applicable  Comments: Pt able to follow exercise prescription today without complaint.  Will continue to monitor for progression.    Dr. Emily Filbert is Medical Director for [facility] and [facility].

## 2018-03-17 ENCOUNTER — Encounter: Payer: BLUE CROSS/BLUE SHIELD | Admitting: *Deleted

## 2018-03-17 DIAGNOSIS — J449 Chronic obstructive pulmonary disease, unspecified: Secondary | ICD-10-CM

## 2018-03-17 NOTE — Progress Notes (Signed)
Daily Session Note  Patient Details  Name: Leslie Sims MRN: [id number] Date of Birth: 11/23/1951 Referring Provider:     Pulmonary Rehab from 01/24/2018 in [facility]  Referring Provider  [name]      Encounter Date: 03/17/2018  Check In: Session Check In - 03/17/18 1135      Check-In   Location  [facility]    Staff Present  Mary Jo Abernethy, RN, BSN, MA;Jessica Hawkins, MA, RCEP, CCRP, Exercise Physiologist;Meredith Craven, RN BSN    Supervising physician immediately available to respond to emergencies  [facility] immediately available ER MD    Physician(s)  Dr. Lord and Quale    Medication changes reported      No    Fall or balance concerns reported     No    Tobacco Cessation  No Change    Warm-up and Cool-down  Performed as group-led instruction    Resistance Training Performed  Yes    VAD Patient?  No      Pain Assessment   Currently in Pain?  No/denies          Social History   Tobacco Use  Smoking Status Current Every Day Smoker  . Packs/day: 0.50  . Years: 42.00  . Pack years: 21.00  . Types: Cigarettes  Smokeless Tobacco Never Used  Tobacco Comment   02/15/18 For tobacco cessation he is getting better.  He is staying between 6-18 cigarettes a day versus a full pack.  He is also set up to start a one on one smoking cessation class since the current classes were already in progress and did not restart un    Goals Met:  Proper associated with RPD/PD & O2 Sat Independence with exercise equipment Using PLB without cueing & demonstrates good technique Exercise tolerated well Strength training completed today  Goals Unmet:  Not Applicable  Comments: Pt able to follow exercise prescription today without complaint.  Will continue to monitor for progression.    Dr. Mark Miller is Medical Director for [facility] and [facility]. 

## 2018-03-20 ENCOUNTER — Encounter: Payer: BLUE CROSS/BLUE SHIELD | Attending: Pulmonary Disease

## 2018-03-20 DIAGNOSIS — J449 Chronic obstructive pulmonary disease, unspecified: Secondary | ICD-10-CM | POA: Insufficient documentation

## 2018-03-20 DIAGNOSIS — J9611 Chronic respiratory failure with hypoxia: Secondary | ICD-10-CM | POA: Insufficient documentation

## 2018-03-20 NOTE — Progress Notes (Signed)
Daily Session Note  Patient Details  Name: Paolo Lee Thomley MRN: [id number] Date of Birth: 05/18/1951 Referring Provider:     Pulmonary Rehab from 01/24/2018 in [facility]  Referring Provider  [name]      Encounter Date: 03/20/2018  Check In: Session Check In - 03/20/18 1136      Check-In   Location  [facility]    Staff Present  Amanda Sommer, BA, ACSM CEP, Exercise Physiologist;Kelly Hayes, BS, ACSM CEP, Exercise Physiologist;  RCP,RRT,BSRT    Supervising physician immediately available to respond to emergencies  [facility] immediately available ER MD    Physician(s)  Dr. Williams and Kinner    Medication changes reported      No    Fall or balance concerns reported     No    Tobacco Cessation  No Change    Warm-up and Cool-down  Performed as group-led instruction    Resistance Training Performed  Yes    VAD Patient?  No      Pain Assessment   Currently in Pain?  No/denies          Social History   Tobacco Use  Smoking Status Current Every Day Smoker  . Packs/day: 0.50  . Years: 42.00  . Pack years: 21.00  . Types: Cigarettes  Smokeless Tobacco Never Used  Tobacco Comment   02/15/18 For tobacco cessation he is getting better.  He is staying between 6-18 cigarettes a day versus a full pack.  He is also set up to start a one on one smoking cessation class since the current classes were already in progress and did not restart un    Goals Met:  Independence with exercise equipment Exercise tolerated well No report of cardiac concerns or symptoms Strength training completed today  Goals Unmet:  Not Applicable  Comments: Pt able to follow exercise prescription today without complaint.  Will continue to monitor for progression.   Dr. Mark Miller is Medical Director for [facility] and [facility]. 

## 2018-03-21 ENCOUNTER — Ambulatory Visit: Payer: BLUE CROSS/BLUE SHIELD | Admitting: Psychology

## 2018-03-21 DIAGNOSIS — F411 Generalized anxiety disorder: Secondary | ICD-10-CM | POA: Diagnosis not present

## 2018-03-21 DIAGNOSIS — F331 Major depressive disorder, recurrent, moderate: Secondary | ICD-10-CM

## 2018-03-22 ENCOUNTER — Telehealth: Payer: Self-pay | Admitting: Family Medicine

## 2018-03-22 NOTE — Telephone Encounter (Signed)
Copied from [place]. Topic: Quick Communication - See Telephone Encounter >> Mar 22, 2018 12:03 PM Ivar Drape wrote: CRM for notification. See Telephone encounter for: 03/22/18. Olivia Mackie w/[employer] [phone] would like to clarify if the patient was suppose to see a different therapist or the same therapist for his depression and anxiety.  The patient said he was advised to see another therapist by the practice.

## 2018-03-24 ENCOUNTER — Encounter: Payer: BLUE CROSS/BLUE SHIELD | Admitting: *Deleted

## 2018-03-24 ENCOUNTER — Encounter: Payer: Self-pay | Admitting: *Deleted

## 2018-03-24 DIAGNOSIS — J449 Chronic obstructive pulmonary disease, unspecified: Secondary | ICD-10-CM | POA: Diagnosis not present

## 2018-03-24 NOTE — Progress Notes (Signed)
Daily Session Note  Patient Details  Name: Leslie Sims MRN: [id number] Date of Birth: Dec 29, 1950 Referring Provider:     Pulmonary Rehab from 01/24/2018 in [facility]  Referring Provider  [name]      Encounter Date: 03/24/2018  Check In: Session Check In - 03/24/18 1251      Check-In   Location  [facility]    Staff Present  Joellyn Rued, BS, PEC;Nitisha Civello, RN, BSN;Mary Kellie Shropshire, RN, BSN, MA    Supervising physician immediately available to respond to emergencies  [facility] immediately available ER MD    Physician(s)  Dr. Mariea Clonts and Dr. Mable Paris    Medication changes reported      No    Fall or balance concerns reported     No    Warm-up and Cool-down  Performed on first and last piece of equipment    Resistance Training Performed  Yes    VAD Patient?  No      Pain Assessment   Currently in Pain?  No/denies          Social History   Tobacco Use  Smoking Status Current Every Day Smoker  . Packs/day: 0.50  . Years: 42.00  . Pack years: 21.00  . Types: Cigarettes  Smokeless Tobacco Never Used  Tobacco Comment   02/15/18 For tobacco cessation he is getting better.  He is staying between 6-18 cigarettes a day versus a full pack.  He is also set up to start a one on one smoking cessation class since the current classes were already in progress and did not restart un    Goals Met:  Proper associated with RPD/PD & O2 Sat Exercise tolerated well Strength training completed today  Goals Unmet:  Not Applicable  Comments:     Dr. Emily Filbert is Medical Director for [facility] and [facility].

## 2018-03-27 DIAGNOSIS — J449 Chronic obstructive pulmonary disease, unspecified: Secondary | ICD-10-CM

## 2018-03-27 NOTE — Progress Notes (Signed)
Pulmonary Individual Treatment Plan  Patient Details  Name: Leslie Sims MRN: [id number] Date of Birth: Mar 08, 1951 Referring Provider:     Pulmonary Rehab from 01/24/2018 in [facility]  Referring Provider  [name]      Initial Encounter Date:    Pulmonary Rehab from 01/24/2018 in [facility] Cardiac and Pulmonary Rehab  Date  01/24/18  Referring Provider  [name]      Visit Diagnosis: Chronic obstructive pulmonary disease, unspecified COPD type ([place])  Patient's Home Medications on Admission:  Current Outpatient Medications:  .  albuterol (VENTOLIN HFA) 108 (90 Base) MCG/ACT inhaler, USE 2 INHALATIONS EVERY 6 HOURS AS NEEDED FOR WHEEZING, Disp: 48 g, Rfl: 3 .  aspirin 81 MG tablet, Take 81 mg by mouth daily.  , Disp: , Rfl:  .  atorvastatin (LIPITOR) 40 MG tablet, Take 1 tablet (40 mg total) by mouth daily., Disp: 90 tablet, Rfl: 3 .  FLUoxetine (PROZAC) 20 MG tablet, Take 1 tablet (20 mg total) by mouth daily., Disp: 30 tablet, Rfl: 3 .  Fluticasone-Salmeterol (ADVAIR DISKUS) 500-50 MCG/DOSE AEPB, Inhale 1 puff into the lungs 2 (two) times daily., Disp: 180 each, Rfl: 3 .  furosemide (LASIX) 20 MG tablet, Take 1 tablet (20 mg total) by mouth daily., Disp: 30 tablet, Rfl: 0 .  ipratropium-albuterol (DUONEB) 0.5-2.5 (3) MG/3ML SOLN, Take 3 mLs by nebulization every 6 (six) hours as needed., Disp: 225 mL, Rfl: 3 .  ketoconazole (NIZORAL) 2 % cream, Apply 1 application topically 2 (two) times daily., Disp: 60 g, Rfl: 0 .  LORazepam (ATIVAN) 1 MG tablet, TAKE 1/2 TABLET BY MOUTH EVERY 6 HOURS AS NEEDED FOR ANXIETY, Disp: 60 tablet, Rfl: 2 .  losartan (COZAAR) 25 MG tablet, TAKE 1 TABLET DAILY, Disp: 90 tablet, Rfl: 3 .  mirtazapine (REMERON) 15 MG tablet, Take 1 tablet (15 mg total) by mouth at bedtime., Disp: 30 tablet, Rfl: 5 .  Multiple Vitamin (MULTIVITAMIN) capsule, Take 1 capsule by mouth daily., Disp: , Rfl:  .  nitroGLYCERIN (NITROSTAT) 0.4 MG SL tablet, Place  1 tablet (0.4 mg total) under the tongue every 5 (five) minutes as needed., Disp: 25 tablet, Rfl: 3 .  sertraline (ZOLOFT) 100 MG tablet, Take 1 tablet (100 mg total) by mouth at bedtime., Disp: 90 tablet, Rfl: 1 .  sucralfate (CARAFATE) 1 GM/10ML suspension, Take 10 mLs (1 g total) by mouth 4 (four) times daily -  with meals and at bedtime., Disp: 420 mL, Rfl: 4 .  tiotropium (SPIRIVA HANDIHALER) 18 MCG inhalation capsule, Place 1 capsule (18 mcg total) into inhaler and inhale daily., Disp: 90 capsule, Rfl: 3  Past Medical History: Past Medical History:  Diagnosis Date  . Allergic rhinitis   . Anxiety   . Atherosclerosis   . Back pain   . Bronchitis    09-14-11  . CARDIOMYOPATHY 03/24/2010   did not tolerate Toprol and Lisinopril.   . Chest pain   . CHF (congestive heart failure) ([place])   . Chronic airway obstruction, not elsewhere classified   . Colon polyps 08/2012   colonoscopy; multiple colon polyps; repeat colonoscopy in one year.  [name] COPD (chronic obstructive pulmonary disease) ([place])   . Coronary artery disease 11/2009   Anterior MI with late presentation 11/2009. Cath showed a 100% proximal LAD occlusion, 99% mid RCA. Had a PCI and 2 DES to both LAD and RCA. EF was 35%. Nuclear stress test 05/13: prior anterior/inferior infarcts without ischemic, EF 43%, cardiac cath in  02/14: Widely patent stents with no other obstructive disease. EF 40%   . Depression   . GERD (gastroesophageal reflux disease)   . Headache(784.0)   . Helicobacter pylori (H. pylori)   . Hemorrhoids   . Hernia    inguinal  . Hyperlipidemia   . Hypertension   . Insomnia   . MI (myocardial infarction) ([place])   . Myalgia   . Peptic ulcer   . Shortness of breath dyspnea   . Status post dilation of esophageal narrowing 2000  . Thyroid dysfunction   . Tinea pedis   . Wears dentures    partial upper    Tobacco Use: Social History   Tobacco Use  Smoking Status Current Every Day Smoker  . Packs/day: 0.50  .  Years: 42.00  . Pack years: 21.00  . Types: Cigarettes  Smokeless Tobacco Never Used  Tobacco Comment   02/15/18 For tobacco cessation he is getting better.  He is staying between 6-18 cigarettes a day versus a full pack.  He is also set up to start a one on one smoking cessation class since the current classes were already in progress and did not restart un    Labs: Recent Review Flowsheet Data    Labs for [facility]P Cardiac and Pulmonary Rehab Latest Ref Rng & Units 03/01/2017 06/17/2017 07/27/2017 12/23/2017 12/25/2017   Cholestrol 100 - 199 mg/dL 112 - 143 - -   LDLCALC 0 - 99 mg/dL 47 - 67 - -   HDL >39 mg/dL 33(L) - 62 - -   Trlycerides 0 - 149 mg/dL 158(H) - 72 - -   Hemoglobin A1c 4.8 - 5.6 % 5.3 5.8(H) - - -   HCO3 20.0 - 28.0 mmol/L - - - 37.2(H) 41.4(H)   O2SAT % - - - 87.4 81.6       Pulmonary Assessment Scores: Pulmonary Assessment Scores    Row Name 01/24/18 1247         ADL UCSD   ADL Phase  Entry New Entry numbers 01/24/2018     SOB Score total  86     Rest  2     Walk  4     Stairs  5     Bath  3     Dress  4     Shop  5       CAT Score   CAT Score  28       mMRC Score   mMRC Score  1        Pulmonary Function Assessment: Pulmonary Function Assessment - 01/24/18 1246      Pulmonary Function Tests   FVC%  72 % date performed 11/01/17    FEV1%  27 %    FEV1/FVC Ratio  30      Breath   Shortness of Breath  Yes;Limiting activity;Fear of Shortness of Breath       Exercise Target Goals:    Exercise Program Goal: Individual exercise prescription set using results from initial 6 min walk test and THRR while considering  patient's activity barriers and safety.    Exercise Prescription Goal: Initial exercise prescription builds to 30-45 minutes a day of aerobic activity, 2-3 days per week.  Home exercise guidelines will be given to patient during program as part of exercise prescription that the participant will acknowledge.  Activity Barriers & Risk  Stratification:   6 Minute Walk: 6 Minute Walk    Row Name 01/24/18 1258  6 Minute Walk   Distance  1000 feet     Walk Time  4.75 minutes     # of Rest Breaks  1     MPH  2.4     METS  3.4     RPE  11     Perceived Dyspnea   2     VO2 Peak  11.9     Symptoms  No     Resting HR  93 bpm     Resting BP  106/72     Resting Oxygen Saturation   95 %     Exercise Oxygen Saturation  during 6 min walk  80 %     Max Ex. HR  123 bpm     Max Ex. BP  132/64     2 Minute Post BP  108/64       Interval HR   1 Minute HR  106     2 Minute HR  104     3 Minute HR  102     4 Minute HR  123     6 Minute HR  120     2 Minute Post HR  97     Interval Heart Rate?  Yes       Interval Oxygen   Interval Oxygen?  Yes     Baseline Oxygen Saturation %  95 %     1 Minute Oxygen Saturation %  82 %     1 Minute Liters of Oxygen  2 L     2 Minute Oxygen Saturation %  82 %     2 Minute Liters of Oxygen  2 L     3 Minute Oxygen Saturation %  81 %     3 Minute Liters of Oxygen  2 L     4 Minute Oxygen Saturation %  83 %     4 Minute Liters of Oxygen  2 L     5 Minute Oxygen Saturation %  81 %     5 Minute Liters of Oxygen  2 L     6 Minute Oxygen Saturation %  80 %     6 Minute Liters of Oxygen  2 L     2 Minute Post Oxygen Saturation %  95 %     2 Minute Post Liters of Oxygen  2 L       Oxygen Initial Assessment: Oxygen Initial Assessment - 01/24/18 1220      Home Oxygen   Home Oxygen Device  Home Concentrator;E-Tanks    Sleep Oxygen Prescription  Continuous    Liters per minute  2    Home Exercise Oxygen Prescription  Continuous    Liters per minute  2 UNtil sees his pulmonary Doctor again.  This is post discharge form January 2019    Home at Rest Exercise Oxygen Prescription  Continuous    Liters per minute  2    Compliance with Home Oxygen Use  Yes      Initial 6 min Walk   Oxygen Used  Continuous;E-Tanks    Liters per minute  2      Program Oxygen Prescription    Program Oxygen Prescription  Continuous;E-Tanks    Liters per minute  2      Intervention   Short Term Goals  To learn and exhibit compliance with exercise, home and travel O2 prescription;To learn and understand importance of monitoring SPO2 with pulse oximeter and demonstrate accurate  use of the pulse oximeter.;To learn and understand importance of maintaining oxygen saturations>88%;To learn and demonstrate proper use of respiratory medications;To learn and demonstrate proper pursed lip breathing techniques or other breathing techniques.    Long  Term Goals  Exhibits compliance with exercise, home and travel O2 prescription;Verbalizes importance of monitoring SPO2 with pulse oximeter and return demonstration;Maintenance of O2 saturations>88%;Exhibits proper breathing techniques, such as pursed lip breathing or other method taught during program session;Compliance with respiratory medication;Demonstrates proper use of MDI's       Oxygen Re-Evaluation: Oxygen Re-Evaluation    Row Name 02/03/18 1136 03/03/18 1155           Program Oxygen Prescription   Program Oxygen Prescription  Continuous;E-Tanks  Continuous;E-Tanks      Liters per minute  2  2        Home Oxygen   Home Oxygen Device  Home Concentrator;E-Tanks  Home Concentrator;E-Tanks      Sleep Oxygen Prescription  Continuous  Continuous      Liters per minute  2  2      Home Exercise Oxygen Prescription  Continuous  Continuous      Liters per minute  2  2      Home at Rest Exercise Oxygen Prescription  Continuous  Continuous      Liters per minute  2  2      Compliance with Home Oxygen Use  Yes  Yes        Goals/Expected Outcomes   Short Term Goals  To learn and exhibit compliance with exercise, home and travel O2 prescription;To learn and understand importance of monitoring SPO2 with pulse oximeter and demonstrate accurate use of the pulse oximeter.;To learn and understand importance of maintaining oxygen saturations>88%;To  learn and demonstrate proper use of respiratory medications;To learn and demonstrate proper pursed lip breathing techniques or other breathing techniques.  To learn and exhibit compliance with exercise, home and travel O2 prescription;To learn and understand importance of monitoring SPO2 with pulse oximeter and demonstrate accurate use of the pulse oximeter.;To learn and understand importance of maintaining oxygen saturations>88%;To learn and demonstrate proper use of respiratory medications;To learn and demonstrate proper pursed lip breathing techniques or other breathing techniques.      Long  Term Goals  Exhibits compliance with exercise, home and travel O2 prescription;Verbalizes importance of monitoring SPO2 with pulse oximeter and return demonstration;Maintenance of O2 saturations>88%;Exhibits proper breathing techniques, such as pursed lip breathing or other method taught during program session;Compliance with respiratory medication;Demonstrates proper use of MDI's  Exhibits compliance with exercise, home and travel O2 prescription;Verbalizes importance of monitoring SPO2 with pulse oximeter and return demonstration;Maintenance of O2 saturations>88%;Exhibits proper breathing techniques, such as pursed lip breathing or other method taught during program session;Compliance with respiratory medication;Demonstrates proper use of MDI's      Comments  Reviewed PLB technique with pt.  Talked about how it work and it's important to maintaining his exercise saturations.    Ibrohim has a pulse oximeter at home and understands that while exercising his oxygen should be above 88 percent. He is taking his respiratory medications regularly. He sates that he is more short of breath in the morning when he wakes up. He wears 2 liters at night when he sleeps. Sometimes when he uses humidity he wakes up with a stuffy nose and when he does not use it he wakes up to dry. Informed him to check his concentrator and make sure  the filter is clean.  Goals/Expected Outcomes  Short: Become more profiecient at using PLB.   Long: Become independent at using PLB.  Short: check his home cencentrator filter. Long: maintain proper use of concentrator         Oxygen Discharge (Final Oxygen Re-Evaluation): Oxygen Re-Evaluation - 03/03/18 1155      Program Oxygen Prescription   Program Oxygen Prescription  Continuous;E-Tanks    Liters per minute  2      Home Oxygen   Home Oxygen Device  Home Concentrator;E-Tanks    Sleep Oxygen Prescription  Continuous    Liters per minute  2    Home Exercise Oxygen Prescription  Continuous    Liters per minute  2    Home at Rest Exercise Oxygen Prescription  Continuous    Liters per minute  2    Compliance with Home Oxygen Use  Yes      Goals/Expected Outcomes   Short Term Goals  To learn and exhibit compliance with exercise, home and travel O2 prescription;To learn and understand importance of monitoring SPO2 with pulse oximeter and demonstrate accurate use of the pulse oximeter.;To learn and understand importance of maintaining oxygen saturations>88%;To learn and demonstrate proper use of respiratory medications;To learn and demonstrate proper pursed lip breathing techniques or other breathing techniques.    Long  Term Goals  Exhibits compliance with exercise, home and travel O2 prescription;Verbalizes importance of monitoring SPO2 with pulse oximeter and return demonstration;Maintenance of O2 saturations>88%;Exhibits proper breathing techniques, such as pursed lip breathing or other method taught during program session;Compliance with respiratory medication;Demonstrates proper use of MDI's    Comments  Faron has a pulse oximeter at home and understands that while exercising his oxygen should be above 88 percent. He is taking his respiratory medications regularly. He sates that he is more short of breath in the morning when he wakes up. He wears 2 liters at night when he sleeps.  Sometimes when he uses humidity he wakes up with a stuffy nose and when he does not use it he wakes up to dry. Informed him to check his concentrator and make sure the filter is clean.    Goals/Expected Outcomes  Short: check his home cencentrator filter. Long: maintain proper use of concentrator       Initial Exercise Prescription: Initial Exercise Prescription - 01/24/18 1300      Date of Initial Exercise RX and Referring Provider   Date  01/24/18    Referring Provider  [name]      Oxygen   Oxygen  Continuous    Liters  2      Treadmill   MPH  2.4    Grade  1.4    Minutes  15    METs  3.3      Recumbant Bike   Level  1    RPM  60    Watts  25    Minutes  15    METs  3.3      T5 Nustep   Level  2    SPM  80    Minutes  15    METs  3.3      Prescription Details   Frequency (times per week)  3    Duration  Progress to 45 minutes of aerobic exercise without signs/symptoms of physical distress      Intensity   THRR 40-80% of Max Heartrate  118-143    Ratings of Perceived Exertion  11-13    Perceived Dyspnea  0-4  Resistance Training   Training Prescription  Yes    Weight  3 lb    Reps  10-15       Perform Capillary Blood Glucose checks as needed.  Exercise Prescription Changes: Exercise Prescription Changes    Row Name 02/14/18 1000 02/15/18 1500 02/28/18 1400 03/15/18 1400       Response to Exercise   Blood Pressure (Admit)  142/80  -  124/70  108/58    Blood Pressure (Exercise)  126/74  -  -  -    Blood Pressure (Exit)  100/58  -  106/58  124/60    Heart Rate (Admit)  115 bpm  -  98 bpm  92 bpm    Heart Rate (Exercise)  130 bpm  -  121 bpm  102 bpm    Heart Rate (Exit)  101 bpm  -  102 bpm  96 bpm    Oxygen Saturation (Admit)  99 %  -  96 %  100 %    Oxygen Saturation (Exercise)  89 %  -  94 %  94 %    Oxygen Saturation (Exit)  98 %  -  95 %  95 %    Rating of Perceived Exertion (Exercise)  12  -  12  10    Perceived Dyspnea (Exercise)  2  -  1   1    Symptoms  none  -  none  none    Duration  Continue with 45 min of aerobic exercise without signs/symptoms of physical distress.  -  Continue with 45 min of aerobic exercise without signs/symptoms of physical distress.  Continue with 45 min of aerobic exercise without signs/symptoms of physical distress.    Intensity  THRR unchanged  -  THRR unchanged  THRR unchanged      Progression   Progression  Continue to progress workloads to maintain intensity without signs/symptoms of physical distress.  -  Continue to progress workloads to maintain intensity without signs/symptoms of physical distress.  Continue to progress workloads to maintain intensity without signs/symptoms of physical distress.    Average METs  2.58  -  2.82  3.04      Resistance Training   Training Prescription  Yes  -  Yes  Yes    Weight  4 lbs  -  5 lbs  5 lbs    Reps  10-15  -  10-15  10-15      Interval Training   Interval Training  No  -  No  No      Oxygen   Oxygen  Continuous  -  Continuous  Continuous    Liters  2  -  2 3L as needed   2      Treadmill   MPH  2.4  -  2.5  2.5    Grade  1  -  0  0.5    Minutes  15  -  15  15    METs  3.17  -  2.91  3.09      Recumbant Bike   Level  2  -  4  4    RPM  70  -  64  85    Watts  22  -  29  32    Minutes  15  -  15  15    METs  3.21  -  3.84  3.84      T5 Nustep   Level  3  -  4  5    SPM  86  -  82  81    Minutes  15  -  15  15    METs  2  -  2.1  2.2      Home Exercise Plan   Plans to continue exercise at  -  Home (comment) walking, ellipitical  Home (comment) walking, ellipitical  Home (comment) walking, ellipitical    Frequency  -  Add 2 additional days to program exercise sessions.  Add 2 additional days to program exercise sessions.  Add 2 additional days to program exercise sessions.    Initial Home Exercises Provided  -  02/15/18  02/15/18  02/15/18       Exercise Comments: Exercise Comments    Row Name 02/03/18 1135            Exercise Comments  First full day of exercise!  Patient was oriented to gym and equipment including functions, settings, policies, and procedures.  Patient's individual exercise prescription and treatment plan were reviewed.  All starting workloads were established based on the results of the 6 minute walk test done at initial orientation visit.  The plan for exercise progression was also introduced and progression will be customized based on patient's performance and goals.          Exercise Goals and Review: Exercise Goals    Row Name 01/24/18 1257             Exercise Goals   Increase Physical Activity  Yes       Intervention  Provide advice, education, support and counseling about physical activity/exercise needs.;Develop an individualized exercise prescription for aerobic and resistive training based on initial evaluation findings, risk stratification, comorbidities and participant's personal goals.       Expected Outcomes  Short Term: Attend rehab on a regular basis to increase amount of physical activity.;Long Term: Add in home exercise to make exercise part of routine and to increase amount of physical activity.;Long Term: Exercising regularly at least 3-5 days a week.       Increase Strength and Stamina  Yes       Intervention  Provide advice, education, support and counseling about physical activity/exercise needs.;Develop an individualized exercise prescription for aerobic and resistive training based on initial evaluation findings, risk stratification, comorbidities and participant's personal goals.       Expected Outcomes  Short Term: Increase workloads from initial exercise prescription for resistance, speed, and METs.;Short Term: Perform resistance training exercises routinely during rehab and add in resistance training at home;Long Term: Improve cardiorespiratory fitness, muscular endurance and strength as measured by increased METs and functional capacity (6MWT)       Able to  understand and use rate of perceived exertion (RPE) scale  Yes       Intervention  Provide education and explanation on how to use RPE scale       Expected Outcomes  Short Term: Able to use RPE daily in rehab to express subjective intensity level;Long Term:  Able to use RPE to guide intensity level when exercising independently       Able to understand and use Dyspnea scale  Yes       Intervention  Provide education and explanation on how to use Dyspnea scale       Expected Outcomes  Short Term: Able to use Dyspnea scale daily in rehab to express subjective sense of shortness of breath during exertion;Long Term: Able to  use Dyspnea scale to guide intensity level when exercising independently       Knowledge and understanding of Target Heart Rate Range (THRR)  Yes       Intervention  Provide education and explanation of THRR including how the numbers were predicted and where they are located for reference       Expected Outcomes  Short Term: Able to state/look up THRR;Long Term: Able to use THRR to govern intensity when exercising independently;Short Term: Able to use daily as guideline for intensity in rehab       Able to check pulse independently  Yes       Intervention  Provide education and demonstration on how to check pulse in carotid and radial arteries.;Review the importance of being able to check your own pulse for safety during independent exercise       Expected Outcomes  Short Term: Able to explain why pulse checking is important during independent exercise;Long Term: Able to check pulse independently and accurately       Understanding of Exercise Prescription  Yes       Intervention  Provide education, explanation, and written materials on patient's individual exercise prescription       Expected Outcomes  Short Term: Able to explain program exercise prescription;Long Term: Able to explain home exercise prescription to exercise independently          Exercise Goals Re-Evaluation  : Exercise Goals Re-Evaluation    Row Name 02/03/18 1135 02/14/18 1034 02/15/18 1513 02/28/18 1456 03/15/18 1447     Exercise Goal Re-Evaluation   Exercise Goals Review  Understanding of Exercise Prescription;Knowledge and understanding of Target Heart Rate Range (THRR);Able to understand and use rate of perceived exertion (RPE) scale;Able to understand and use Dyspnea scale  Increase Physical Activity;Understanding of Exercise Prescription;Increase Strength and Stamina  Increase Physical Activity;Understanding of Exercise Prescription;Increase Strength and Stamina;Able to understand and use Dyspnea scale;Knowledge and understanding of Target Heart Rate Range (THRR);Able to understand and use rate of perceived exertion (RPE) scale;Able to check pulse independently  Increase Physical Activity;Understanding of Exercise Prescription;Increase Strength and Stamina  Increase Physical Activity;Understanding of Exercise Prescription;Increase Strength and Stamina   Comments  Reviewed RPE scale, THR and program prescription with pt today.  Pt voiced understanding and was given a copy of goals to take home.   Walid is off to a good start in rehab.  He is already up to level 2 on the recumbent bike and level 3 on the T5 NuStep.  We will continue to monitor his progression.   Reviewed home exercise with pt today.  Pt plans to walk and eventually use elliptical at home for exercise.  Reviewed THR, pulse, RPE, sign and symptoms, NTG use, and when to call 911 or MD.  Also discussed weather considerations and indoor options.  Pt voiced understanding.  Maxton has been doing well in rehab.  He is now up to level 4 on the T5 NuStep and the recumbent bike.  He seems to be enjoying the exercise more this go around.  We will continue to monitor his progression.   Damaree continues to do well in rehab. He is now using 5 lbs handweights and moved up to level 5 on the T5 NuStep. He has also added 0.5% grade to his treadmill. We will  continue to monitor his progression.   Expected Outcomes  Short: Use RPE daily to regulate intensity.  Long: Follow program prescription in THR.  Short: Continue to attend classes regularly.  Long: Continue to work on Animator.   Short: Add in at least one extra day of walking at home.  Long: Continue to build strength and stamina.   Short: Continue to increase walking here and at home.  Long: Continue to exercise on off days.   Short: Add more incline to treadmill.  Long: Continue to build strength and stamina.       Discharge Exercise Prescription (Final Exercise Prescription Changes): Exercise Prescription Changes - 03/15/18 1400      Response to Exercise   Blood Pressure (Admit)  108/58    Blood Pressure (Exit)  124/60    Heart Rate (Admit)  92 bpm    Heart Rate (Exercise)  102 bpm    Heart Rate (Exit)  96 bpm    Oxygen Saturation (Admit)  100 %    Oxygen Saturation (Exercise)  94 %    Oxygen Saturation (Exit)  95 %    Rating of Perceived Exertion (Exercise)  10    Perceived Dyspnea (Exercise)  1    Symptoms  none    Duration  Continue with 45 min of aerobic exercise without signs/symptoms of physical distress.    Intensity  THRR unchanged      Progression   Progression  Continue to progress workloads to maintain intensity without signs/symptoms of physical distress.    Average METs  3.04      Resistance Training   Training Prescription  Yes    Weight  5 lbs    Reps  10-15      Interval Training   Interval Training  No      Oxygen   Oxygen  Continuous    Liters  2      Treadmill   MPH  2.5    Grade  0.5    Minutes  15    METs  3.09      Recumbant Bike   Level  4    RPM  85    Watts  32    Minutes  15    METs  3.84      T5 Nustep   Level  5    SPM  81    Minutes  15    METs  2.2      Home Exercise Plan   Plans to continue exercise at  Home (comment) walking, ellipitical    Frequency  Add 2 additional days to program exercise  sessions.    Initial Home Exercises Provided  02/15/18       Nutrition:  Target Goals: Understanding of nutrition guidelines, daily intake of sodium <1579m, cholesterol <2034m calories 30% from fat and 7% or less from saturated fats, daily to have 5 or more servings of fruits and vegetables.  Biometrics: Pre Biometrics - 01/24/18 1257      Pre Biometrics   Height  5' 9.5" (1.765 m)    Weight  141 lb 12.8 oz (64.3 kg)    Waist Circumference  35 inches    Hip Circumference  37.25 inches    Waist to Hip Ratio  0.94 %    BMI (Calculated)  20.65        Nutrition Therapy Plan and Nutrition Goals: Nutrition Therapy & Goals - 03/15/18 1227      Nutrition Therapy   Diet  TLC diabetic but does not follow diabetic diet, follows a low sodium diet    Drug/Food Interactions  Statins/Certain Fruits    Protein (specify units)  12-13oz    Fiber  30 grams    Whole Grain Foods  3 servings    Saturated Fats  16 max. grams    Fruits and Vegetables  2 servings/day does not regularly consume them; 8 servings ideal     Sodium  1500 grams      Personal Nutrition Goals   Nutrition Goal  Increase intake of fruits and vegetables to at least 1 serving per day    Personal Goal #2  Do not skip meals! Eat on a regular schedule and consume snacks between meals when possible. Focus on protein-rich meals and snacks    Personal Goal #3  Consume Premier Protein shakes more consistently, at least 1/day ideally    Comments  patient reports recent wt loss of 10# and is struggling to maintain weight. consumes seafood, pork chops, beans, eggs, dairy and peanut butter for protein      Intervention Plan   Intervention  Prescribe, educate and counsel regarding individualized specific dietary modifications aiming towards targeted core components such as weight, hypertension, lipid management, diabetes, heart failure and other comorbidities.;Nutrition handout(s) given to patient. Nutition therapy for COPD handout  provided    Expected Outcomes  Short Term Goal: Understand basic principles of dietary content, such as calories, fat, sodium, cholesterol and nutrients.;Short Term Goal: A plan has been developed with personal nutrition goals set during dietitian appointment.;Long Term Goal: Adherence to prescribed nutrition plan.       Nutrition Assessments: Nutrition Assessments - 01/24/18 1224      MEDFICTS Scores   Pre Score  38       Nutrition Goals Re-Evaluation: Nutrition Goals Re-Evaluation    Row Name 02/15/18 1525 03/03/18 1205 03/15/18 1231 03/15/18 1232       Goals   Current Weight  139 lb (63 kg)  144 lb (65.3 kg)  -  -    Nutrition Goal  Meet with dietician to gain weight  Meet with dietician to gain weight, eat healthier  Increase intake of fruits and vegetables, ideally working up to consuming each food group at least daily  Do not skip meals; eat on a regular schedule and utilize [employer] Protein shakes as needed for extra protein and calories    Comment  Joshuan would like to gain weight.  He wants to learn to eat right but still gain some weight.  He has an appointment scheduled to meet with dietician next week.  Eulis meets with the dietician next week. He wants to gain a little bit of weight. Informed him that his diet is important to his breathing. Patient verbalizes understanding.  He reports intake of fruits and vegetables to be low at this time  Patient reports difficulty maintaining weight, recent wt loss of 10# per his report, decreased appetite    Expected Outcome  Short: Meet with dietician.  Long: Continue to follow recommendations.   Short: Meet with dietician.  Long: Continue to follow recommendations of dietician.   Patient will consume both fruits and vegetables daily  Patient will maintain CBW or gain weight        Nutrition Goals Discharge (Final Nutrition Goals Re-Evaluation): Nutrition Goals Re-Evaluation - 03/15/18 1232      Goals   Nutrition Goal  Do not skip  meals; eat on a regular schedule and utilize Premier Protein shakes as needed for extra protein and calories    Comment  Patient reports difficulty maintaining weight, recent wt loss of 10# per his report, decreased appetite  Expected Outcome  Patient will maintain CBW or gain weight        Psychosocial: Target Goals: Acknowledge presence or absence of significant depression and/or stress, maximize coping skills, provide positive support system. Participant is able to verbalize types and ability to use techniques and skills needed for reducing stress and depression.   Initial Review & Psychosocial Screening: Initial Psych Review & Screening - 01/24/18 1251      Initial Review   Comments  Jheremy states that he tries to get out of the house, away from home,at least 30 min every day.   He does have  a CAT Scan due soon for followup of nodules found earlier in his chest.  Biopsy did indicate not cancer.       Family Dynamics   Good Support System?  -- Joeseph has his 2 dogs at home that are great companions to him       Quality of Life Scores:  Scores of 19 and below usually indicate a poorer quality of life in these areas.  A difference of  2-3 points is a clinically meaningful difference.  A difference of 2-3 points in the total score of the Quality of Life Index has been associated with significant improvement in overall quality of life, self-image, physical symptoms, and general health in studies assessing change in quality of life.  PHQ-9: Recent Review Flowsheet Data    Depression screen [facility] 2/9 03/03/2018 01/24/2018 01/09/2018 11/16/2017 10/05/2017   Decreased Interest 3 3 3 2 1    Down, Depressed, Hopeless 3 3 3 2 1    PHQ - 2 Score 6 6 6 4 2    Altered sleeping 3 3 3 1  0   Tired, decreased energy 3 3 3 1 1    Change in appetite 1 3 3 2  0   Feeling bad or failure about yourself  1 1 2 1 1    Trouble concentrating 1 3 2  0 0   Moving slowly or fidgety/restless 1 3 0 0 0   Suicidal  thoughts 1 0 0 0 0   PHQ-9 Score 17 22 19 9 4    Difficult doing work/chores - Extremely dIfficult - - -     Interpretation of Total Score  Total Score Depression Severity:  1-4 = Minimal depression, 5-9 = Mild depression, 10-14 = Moderate depression, 15-19 = Moderately severe depression, 20-27 = Severe depression   Psychosocial Evaluation and Intervention: Psychosocial Evaluation - 02/08/18 1219      Psychosocial Evaluation & Interventions   Comments  Mr. Hark[facility]) has returned to this Pulmonary Rehab program following a hospitalization recently for an upper lung infection.  He is a 67 year old who suffers with multiple health issues including COPD.  Arville has a strong support system with his daughter close by and his local church family.  He sadly lost his primary support spouse this past July and is grieving that loss currently.  Connell reports chronic sleeping problems and has lost his appetite recently.  He has a history of depression and anxiety and reports these have both worsened with the loss of his spouse, brother and sister in the past several years.  He is on medication to help with this and reports it is working "somewhat."  Arya is attending Grief share support groups with his daughter and states this is helping as well.  Ayaan's PHQ-9 is "22" indicating severe depression currently.  Counselor discussed this with him and encouraged a medication evaluation if this doesn't improve significantly  in the next few weeks.  Jaceon has goals to breathe better; be off the oxygen during the day and improve his health and be able to return to more normal activities.  He will be followed by staff and this counselor throughout the course of this program.      Expected Outcomes  Tashaun will begin to exercise more consistently to help with his goals to breathe better and also to help with his depression/anxiety symptoms.  The educational and psychoeducational components of this program  will help him understand and cope more positively with his condition. Chrishun will have a medication evaluation to address his mood symptoms in the near future.      Continue Psychosocial Services   Follow up required by counselor       Psychosocial Re-Evaluation: Psychosocial Re-Evaluation    [place] Name 03/13/18 1224             Psychosocial Re-Evaluation   Current issues with  Current Sleep Concerns;Current Psychotropic Meds;Current Anxiety/Panic;Current Depression       Comments  Counselor follow up with Daichi today.  He was tearful and reported "not sure what's going on."  He denies HI/SI at this time.  He states his anxiety has increased as he is afraid of going too far from home; afraid of increased shortness of breath; and is afraid of not being able to breathe when he takes a shower - opens window and shower curtain.  His medications for anxiety and depression have recently been changed and he continues to struggle.  Demarrion is attending grief-share for his complicated grief subsequent to the loss of his spouse 9 months ago.  He reports not sleeping well and no interest in things he used to enjoy.  Jadan is meeting with a therapist tomorrow for his initial intake appointment.  He is being followed on his medications by his PCP.  This counselor suggested a psychiatrist since he has had so many negative reactions to psychotropic medications.  Counselor provided a name and number for a local psychiatrist.  Daaron states he feels better once he works out.  He also thinks part of the problem yesterday was he forgot his nicotene patch in the morning.  Counselor will continue to provide support and Eulas will meet with his counselor; call his daughter for support; and contact a psychiatrist for medication evaluation.         Expected Outcomes  [place] will contact a psychiatrist; meet with a new therapist tomorrow; and call his Daughter for support during this time.  He also will continue  to attend the Grief support group.  LT - to continue to work on smoking cessation and exercise for improved breathing and health.       Interventions  Relaxation education;Physician referral;Encouraged to attend Pulmonary Rehabilitation for the exercise       Continue Psychosocial Services   Follow up required by counselor          Psychosocial Discharge (Final Psychosocial Re-Evaluation): Psychosocial Re-Evaluation - 03/13/18 1224      Psychosocial Re-Evaluation   Current issues with  Current Sleep Concerns;Current Psychotropic Meds;Current Anxiety/Panic;Current Depression    Comments  Counselor follow up with [place] today.  He was tearful and reported "not sure what's going on."  He denies HI/SI at this time.  He states his anxiety has increased as he is afraid of going too far from home; afraid of increased shortness of breath; and is afraid of not being able to  breathe when he takes a shower - opens window and shower curtain.  His medications for anxiety and depression have recently been changed and he continues to struggle.  Eesa is attending grief-share for his complicated grief subsequent to the loss of his spouse 9 months ago.  He reports not sleeping well and no interest in things he used to enjoy.  Ade is meeting with a therapist tomorrow for his initial intake appointment.  He is being followed on his medications by his PCP.  This counselor suggested a psychiatrist since he has had so many negative reactions to psychotropic medications.  Counselor provided a name and number for a local psychiatrist.  Amelio states he feels better once he works out.  He also thinks part of the problem yesterday was he forgot his nicotene patch in the morning.  Counselor will continue to provide support and Kento will meet with his counselor; call his daughter for support; and contact a psychiatrist for medication evaluation.      Expected Outcomes  [place] will contact a psychiatrist; meet with  a new therapist tomorrow; and call his Daughter for support during this time.  He also will continue to attend the Grief support group.  LT - to continue to work on smoking cessation and exercise for improved breathing and health.    Interventions  Relaxation education;Physician referral;Encouraged to attend Pulmonary Rehabilitation for the exercise    Continue Psychosocial Services   Follow up required by counselor       Education: Education Goals: Education classes will be provided on a weekly basis, covering required topics. Participant will state understanding/return demonstration of topics presented.  Learning Barriers/Preferences: Learning Barriers/Preferences - 01/24/18 1237      Learning Barriers/Preferences   Learning Barriers  None    Learning Preferences  Verbal Instruction;Video       Education Topics:  Initial Evaluation Education: - Verbal, written and demonstration of respiratory meds, oximetry and breathing techniques. Instruction on use of nebulizers and MDIs and importance of monitoring MDI activations.   Pulmonary Rehab from 03/20/2018 in [facility] Cardiac and Pulmonary Rehab  Date  01/24/18  Educator  SB  Instruction Review Code  1- Verbalizes Understanding      General Nutrition Guidelines/Fats and Fiber: -Group instruction provided by verbal, written material, models and posters to present the general guidelines for heart healthy nutrition. Gives an explanation and review of dietary fats and fiber.   Pulmonary Rehab from 03/20/2018 in [facility] Cardiac and Pulmonary Rehab  Date  03/13/18  Educator  CR  Instruction Review Code  1- Verbalizes Understanding      Controlling Sodium/Reading Food Labels: -Group verbal and written material supporting the discussion of sodium use in heart healthy nutrition. Review and explanation with models, verbal and written materials for utilization of the food label.   Pulmonary Rehab from 03/20/2018 in [facility] Cardiac and Pulmonary Rehab   Date  03/20/18  Educator  CR  Instruction Review Code  1- Verbalizes Understanding      Exercise Physiology & General Exercise Guidelines: - Group verbal and written instruction with models to review the exercise physiology of the cardiovascular system and associated critical values. Provides general exercise guidelines with specific guidelines to those with heart or lung disease.    Aerobic Exercise & Resistance Training: - Gives group verbal and written instruction on the various components of exercise. Focuses on aerobic and resistive training programs and the benefits of this training and how to safely progress through these  programs.   Flexibility, Balance, Mind/Body Relaxation: Provides group verbal/written instruction on the benefits of flexibility and balance training, including mind/body exercise modes such as yoga, pilates and tai chi.  Demonstration and skill practice provided.   Stress and Anxiety: - Provides group verbal and written instruction about the health risks of elevated stress and causes of high stress.  Discuss the correlation between heart/lung disease and anxiety and treatment options. Review healthy ways to manage with stress and anxiety.   Pulmonary Rehab from 03/20/2018 in [facility] Cardiac and Pulmonary Rehab  Date  02/22/18  Educator  [facility]  Instruction Review Code  1- Verbalizes Understanding      Depression: - Provides group verbal and written instruction on the correlation between heart/lung disease and depressed mood, treatment options, and the stigmas associated with seeking treatment.   Pulmonary Rehab from 03/20/2018 in [facility] Cardiac and Pulmonary Rehab  Date  02/08/18  Educator  [facility]  Instruction Review Code  1- Verbalizes Understanding      Exercise & Equipment Safety: - Individual verbal instruction and demonstration of equipment use and safety with use of the equipment.   Pulmonary Rehab from 03/20/2018 in [facility] Cardiac and Pulmonary Rehab  Date   01/24/18  Educator  Sb  Instruction Review Code  1- Verbalizes Understanding      Infection Prevention: - Provides verbal and written material to individual with discussion of infection control including proper hand washing and proper equipment cleaning during exercise session.   Pulmonary Rehab from 03/20/2018 in [facility] Cardiac and Pulmonary Rehab  Date  01/24/18  Educator  Sb  Instruction Review Code  1- Verbalizes Understanding      Falls Prevention: - Provides verbal and written material to individual with discussion of falls prevention and safety.   Pulmonary Rehab from 03/20/2018 in [facility] Cardiac and Pulmonary Rehab  Date  01/24/18  Educator  SB  Instruction Review Code  1- Verbalizes Understanding      Diabetes: - Individual verbal and written instruction to review signs/symptoms of diabetes, desired ranges of glucose level fasting, after meals and with exercise. Advice that pre and post exercise glucose checks will be done for 3 sessions at entry of program.   Chronic Lung Diseases: - Group verbal and written instruction to review updates, respiratory medications, advancements in procedures and treatments. Discuss use of supplemental oxygen including available portable oxygen systems, continuous and intermittent flow rates, concentrators, personal use and safety guidelines. Review proper use of inhaler and spacers. Provide informative websites for self-education.    Pulmonary Rehab from 03/20/2018 in [facility] Cardiac and Pulmonary Rehab  Date  03/15/18  Educator  [facility]  Instruction Review Code  1- Verbalizes Understanding      Energy Conservation: - Provide group verbal and written instruction for methods to conserve energy, plan and organize activities. Instruct on pacing techniques, use of adaptive equipment and posture/positioning to relieve shortness of breath.   Pulmonary Rehab from 03/20/2018 in [facility] Cardiac and Pulmonary Rehab  Date  02/15/18  Educator  [facility]  Instruction Review  Code  1- Verbalizes Understanding      Triggers and Exacerbations: - Group verbal and written instruction to review types of environmental triggers and ways to prevent exacerbations. Discuss weather changes, air quality and the benefits of nasal washing. Review warning signs and symptoms to help prevent infections. Discuss techniques for effective airway clearance, coughing, and vibrations.   AED/CPR: - Group verbal and written instruction with the use of models to demonstrate the basic use  of the AED with the basic ABC's of resuscitation.   Pulmonary Rehab from 03/20/2018 in [facility] Cardiac and Pulmonary Rehab  Date  02/17/18  Educator  [facility]  Instruction Review Code  1- Actuary and Physiology of the Lungs: - Group verbal and written instruction with the use of models to provide basic lung anatomy and physiology related to function, structure and complications of lung disease.   Pulmonary Rehab from 03/20/2018 in [facility] Cardiac and Pulmonary Rehab  Date  03/01/18  Educator  [facility]  Instruction Review Code  1- Verbalizes Understanding      Anatomy & Physiology of the Heart: - Group verbal and written instruction and models provide basic cardiac anatomy and physiology, with the coronary electrical and arterial systems. Review of Valvular disease and Heart Failure   Cardiac Medications: - Group verbal and written instruction to review commonly prescribed medications for heart disease. Reviews the medication, class of the drug, and side effects.   Pulmonary Rehab from 03/20/2018 in [facility] Cardiac and Pulmonary Rehab  Date  02/03/18  Educator  [facility]  Instruction Review Code  1- Verbalizes Understanding      Know Your Numbers and Risk Factors: -Group verbal and written instruction about important numbers in your health.  Discussion of what are risk factors and how they play a role in the disease process.  Review of Cholesterol, Blood Pressure, Diabetes, and BMI and the role  they play in your overall health.   Sleep Hygiene: -Provides group verbal and written instruction about how sleep can affect your health.  Define sleep hygiene, discuss sleep cycles and impact of sleep habits. Review good sleep hygiene tips.    Other: -Provides group and verbal instruction on various topics (see comments)    Knowledge Questionnaire Score: Knowledge Questionnaire Score - 01/24/18 1237      Knowledge Questionnaire Score   Pre Score  15/18 Reviewed correct responses with Jamarien. He verbalized understading of the correct response. He had no questions today        Core Components/Risk Factors/Patient Goals at Admission: Personal Goals and Risk Factors at Admission - 01/24/18 1226      Core Components/Risk Factors/Patient Goals on Admission    Weight Management  Yes    Intervention  Weight Management: Develop a combined nutrition and exercise program designed to reach desired caloric intake, while maintaining appropriate intake of nutrient and fiber, sodium and fats, and appropriate energy expenditure required for the weight goal.;Weight Management: Provide education and appropriate resources to help participant work on and attain dietary goals.;Weight Management/Obesity: Establish reasonable short term and long term weight goals.    Admit Weight  141 lb 12.8 oz (64.3 kg)    Goal Weight: Short Term  145 lb (65.8 kg)    Goal Weight: Long Term  145 lb (65.8 kg)    Expected Outcomes  Short Term: Continue to assess and modify interventions until short term weight is achieved;Long Term: Adherence to nutrition and physical activity/exercise program aimed toward attainment of established weight goal;Weight Maintenance: Understanding of the daily nutrition guidelines, which includes 25-35% calories from fat, 7% or less cal from saturated fats, less than 217m cholesterol, less than 1.5gm of sodium, & 5 or more servings of fruits and vegetables daily;Understanding recommendations for  meals to include 15-35% energy as protein, 25-35% energy from fat, 35-60% energy from carbohydrates, less than 2024mof dietary cholesterol, 20-35 gm of total fiber daily;Understanding of distribution of calorie intake throughout the  day with the consumption of 4-5 meals/snacks    Tobacco Cessation  Yes    Intervention  Assist the participant in steps to quit. Provide individualized education and counseling about committing to Tobacco Cessation, relapse prevention, and pharmacological support that can be provided by physician.;Advice worker, assist with locating and accessing local/national Quit Smoking programs, and support quit date choice. Given smoking cessation packet. Reviewed need to see the counselor to help him with the smoking cessation journey.    Expected Outcomes  Short Term: Will demonstrate readiness to quit, by selecting a quit date.;Long Term: Complete abstinence from all tobacco products for at least 12 months from quit date.;Short Term: Will quit all tobacco product use, adhering to prevention of relapse plan.    Improve shortness of breath with ADL's  Yes    Intervention  Provide education, individualized exercise plan and daily activity instruction to help decrease symptoms of SOB with activities of daily living.    Expected Outcomes  Short Term: Improve cardiorespiratory fitness to achieve a reduction of symptoms when performing ADLs;Long Term: Be able to perform more ADLs without symptoms or delay the onset of symptoms    Heart Failure  Yes    Intervention  Provide a combined exercise and nutrition program that is supplemented with education, support and counseling about heart failure. Directed toward relieving symptoms such as shortness of breath, decreased exercise tolerance, and extremity edema.    Expected Outcomes  Improve functional capacity of life;Short term: Attendance in program 2-3 days a week with increased exercise capacity. Reported lower sodium intake.  Reported increased fruit and vegetable intake. Reports medication compliance.;Short term: Daily weights obtained and reported for increase. Utilizing diuretic protocols set by physician.;Long term: Adoption of self-care skills and reduction of barriers for early signs and symptoms recognition and intervention leading to self-care maintenance.    Hypertension  Yes    Intervention  Provide education on lifestyle modifcations including regular physical activity/exercise, weight management, moderate sodium restriction and increased consumption of fresh fruit, vegetables, and low fat dairy, alcohol moderation, and smoking cessation.;Monitor prescription use compliance.    Expected Outcomes  Short Term: Continued assessment and intervention until BP is < 140/75m HG in hypertensive participants. < 130/8105mHG in hypertensive participants with diabetes, heart failure or chronic kidney disease.;Long Term: Maintenance of blood pressure at goal levels.    Lipids  Yes    Intervention  Provide education and support for participant on nutrition & aerobic/resistive exercise along with prescribed medications to achieve LDL <70100mHDL >36m47m  Expected Outcomes  Short Term: Participant states understanding of desired cholesterol values and is compliant with medications prescribed. Participant is following exercise prescription and nutrition guidelines.;Long Term: Cholesterol controlled with medications as prescribed, with individualized exercise RX and with personalized nutrition plan. Value goals: LDL < 70mg8mL > 40 mg.    Stress  Yes    Intervention  Offer individual and/or small group education and counseling on adjustment to heart disease, stress management and health-related lifestyle change. Teach and support self-help strategies.;Refer participants experiencing significant psychosocial distress to appropriate mental health specialists for further evaluation and treatment. When possible, include family members and  significant others in education/counseling sessions. Loss of three family members in the past year. One being his wife of 37 ye56s.  Continues to grieve for his wife.Is attending grief counseling program.     Expected Outcomes  Short Term: Participant demonstrates changes in health-related behavior, relaxation and other stress management skills, ability to  obtain effective social support, and compliance with psychotropic medications if prescribed.;Long Term: Emotional wellbeing is indicated by absence of clinically significant psychosocial distress or social isolation.       Core Components/Risk Factors/Patient Goals Review:  Goals and Risk Factor Review    Row Name 02/15/18 1514 03/03/18 1151           Core Components/Risk Factors/Patient Goals Review   Personal Goals Review  Weight Management/Obesity;Heart Failure;Hypertension;Tobacco Cessation;Lipids;Improve shortness of breath with ADL's  Weight Management/Obesity;Heart Failure;Hypertension;Tobacco Cessation;Lipids;Improve shortness of breath with ADL's      Review  Nakai has been doing well in rehab.  His weight has been holding steady but he would like to gain weight.  His goal is to get back up to 145 lbs but he wants to build up muscle.  He has an appointment with the dietician during class next week to help with weight gain.  He is doing a little better with his shortness of breath but there are some days that it is worse than others.   He continues to work on his heart failure and has not had any symptoms.  He monitors his blood pressures at home at least 3-4x a week and has it checked in class as well.  He is doing well on his medications.   For tobacco cessation he is getting better.  He is staying between 6-18 cigarettes a day versus a full pack.  He is also set up to start a one on one smoking cessation class since the current classes were already in progress and did not restart until April.  He has strated to make the right steps.  We  talked a little about combination therapy as he would not like to take Chantix again.  I also gave him a fake cigarette to hold and use in lieu of a real one.  We will continue to monitor his tobacco use.  Tamela Oddi has been attending class regularly and has been working hard to stop smoking. He started wearing the patch 2 weeks ago. He still smokes about 16-18 cigarettes a day. His blood pressure has been withing normal limits which he checks at home. His shortness of breath is getting a little better but somedays are rougher than others. He has yet to go to the smoking cessation class on March 19th.      Expected Outcomes  Short: Start smoking cessation classes.  Long: Continue to work on weight gain.   Short: attened smoking cessation class on the 19th. Long: quit smoking.         Core Components/Risk Factors/Patient Goals at Discharge (Final Review):  Goals and Risk Factor Review - 03/03/18 1151      Core Components/Risk Factors/Patient Goals Review   Personal Goals Review  Weight Management/Obesity;Heart Failure;Hypertension;Tobacco Cessation;Lipids;Improve shortness of breath with ADL's    Review  Tamela Oddi has been attending class regularly and has been working hard to stop smoking. He started wearing the patch 2 weeks ago. He still smokes about 16-18 cigarettes a day. His blood pressure has been withing normal limits which he checks at home. His shortness of breath is getting a little better but somedays are rougher than others. He has yet to go to the smoking cessation class on March 19th.    Expected Outcomes  Short: attened smoking cessation class on the 19th. Long: quit smoking.       ITP Comments: ITP Comments    Row Name 01/24/18 1218 01/30/18 3428 02/24/18 1227 02/27/18 7681  03/27/18 0901   ITP Comments  Medical review completed today. ITP to Dr Sabra Heck for review, changes as needed and signature.   Documentation of diagnosis can be found in [facility] Encounter 12/25/2017.  New admission. Did not  complete program last year, because his wife became very sick and died in 14-Jul-2023.   30 day review completed. ITP sent to Dr. Emily Filbert Director of [place]. Continue with ITP unless changes are made by physician.  Grigor's daughter delivered his Nicotine patches during class. He is very motivated to quit smoking and has an appointment with a [employer] rep in a couple weeks   30 day review completed. ITP sent to Dr. Emily Filbert Director of [place]. Continue with ITP unless changes are made by physician.   30 day review completed. ITP sent to Dr. Emily Filbert Director of [place]. Continue with ITP unless changes are made by physician      Comments: 30 day review

## 2018-03-27 NOTE — Progress Notes (Signed)
Daily Session Note  Patient Details  Name: Leslie Sims MRN: [id number] Date of Birth: 1950/12/24 Referring Provider:     Pulmonary Rehab from 01/24/2018 in [facility] Cardiac and Pulmonary Rehab  Referring Provider  [name]      Encounter Date: 03/27/2018  Check In: Session Check In - 03/27/18 1122      Check-In   Location  [facility]-Cardiac & Pulmonary Rehab    Staff Present  Earlean Shawl, BS, ACSM CEP, Exercise Physiologist;Amanda Oletta Darter, BA, ACSM CEP, Exercise Physiologist;Weston Kallman Flavia Shipper    Supervising physician immediately available to respond to emergencies  [facility] immediately available ER MD    Physician(s)   Dr. Corky Downs and [facility]    Medication changes reported      No    Fall or balance concerns reported     No    Tobacco Cessation  No Change    Warm-up and Cool-down  Performed as group-led instruction    Resistance Training Performed  Yes    VAD Patient?  No      Pain Assessment   Currently in Pain?  No/denies          Social History   Tobacco Use  Smoking Status Current Every Day Smoker  . Packs/day: 0.50  . Years: 42.00  . Pack years: 21.00  . Types: Cigarettes  Smokeless Tobacco Never Used  Tobacco Comment   02/15/18 For tobacco cessation he is getting better.  He is staying between 6-18 cigarettes a day versus a full pack.  He is also set up to start a one on one smoking cessation class since the current classes were already in progress and did not restart un    Goals Met:  Independence with exercise equipment Exercise tolerated well No report of cardiac concerns or symptoms Strength training completed today  Goals Unmet:  Not Applicable  Comments: Pt able to follow exercise prescription today without complaint.  Will continue to monitor for progression.   Dr. Emily Filbert is Medical Director for [place] and [facility].

## 2018-03-30 ENCOUNTER — Ambulatory Visit: Payer: BLUE CROSS/BLUE SHIELD | Admitting: Psychology

## 2018-03-30 DIAGNOSIS — F331 Major depressive disorder, recurrent, moderate: Secondary | ICD-10-CM

## 2018-03-30 DIAGNOSIS — F411 Generalized anxiety disorder: Secondary | ICD-10-CM

## 2018-03-31 ENCOUNTER — Telehealth: Payer: Self-pay | Admitting: Family Medicine

## 2018-03-31 DIAGNOSIS — J449 Chronic obstructive pulmonary disease, unspecified: Secondary | ICD-10-CM | POA: Diagnosis not present

## 2018-03-31 MED ORDER — PREDNISONE 20 MG PO TABS: ORAL_TABLET | ORAL | 0 refills | Status: DC

## 2018-03-31 NOTE — Telephone Encounter (Signed)
Pt calling reporting SOB, chest burning, rhinorrhea, sneezing for several days. No fever/chills/sweats.  No thick discolored sputum.  Has been working outside with exposure to pollen.  No sore throat or ear pain.  Wheezing some.  Using nebulizer with some relief.  Using nebulizer 3-4 times per day. Missed pulmonary rehab on Wednesday; went today to pulmonary rehab; oxygen level remaining 92-93% which is his baseline.  Has Flonase and Astelin yet not using; only using nasal saline spray.  A/P: COPD exacerbation secondary to allergic rhinitis exacerbation: start Flonase and Astelin; if no improvement in 48-72 hours, start Prednisone taper.  If worsens, warrants visit. Prednisone taper sent to [facility].

## 2018-03-31 NOTE — Progress Notes (Signed)
Daily Session Note  Patient Details  Name: Leslie Sims MRN: [id number] Date of Birth: December 01, 1951 Referring Provider:     Pulmonary Rehab from 01/24/2018 in [facility] Cardiac and Pulmonary Rehab  Referring Provider  [name]      Encounter Date: 03/31/2018  Check In: Session Check In - 03/31/18 1129      Check-In   Location  [facility]-Cardiac & Pulmonary Rehab    Staff Present  Alberteen Sam, MA, RCEP, CCRP, Exercise Physiologist;Meredith Sherryll Burger, RN BSN;Joseph Flavia Shipper    Supervising physician immediately available to respond to emergencies  [facility] immediately available ER MD    Physician(s)  Dr. Alfred Levins and [facility]    Medication changes reported      No    Fall or balance concerns reported     No    Tobacco Cessation  No Change    Warm-up and Cool-down  Performed as group-led instruction    Resistance Training Performed  Yes    VAD Patient?  No      Pain Assessment   Currently in Pain?  No/denies          Social History   Tobacco Use  Smoking Status Current Every Day Smoker  . Packs/day: 0.50  . Years: 42.00  . Pack years: 21.00  . Types: Cigarettes  Smokeless Tobacco Never Used  Tobacco Comment   02/15/18 For tobacco cessation he is getting better.  He is staying between 6-18 cigarettes a day versus a full pack.  He is also set up to start a one on one smoking cessation class since the current classes were already in progress and did not restart un    Goals Met:  Proper associated with RPD/PD & O2 Sat Independence with exercise equipment Using PLB without cueing & demonstrates good technique Exercise tolerated well No report of cardiac concerns or symptoms Strength training completed today  Goals Unmet:  Not Applicable  Comments: Pt able to follow exercise prescription today without complaint.  Will continue to monitor for progression.    Dr. Emily Filbert is Medical Director for [place] and [facility].

## 2018-04-03 NOTE — Telephone Encounter (Signed)
Left message on Leslie Sims/[employer] secured voicemail stating that Leslie Sims has requested a different therapist for depression/anxiety. He is struggling to talk openly with current therapist. Recommend Leslie Sims contact Leslie Sims to clarify if he would like to switch therapist at this time.

## 2018-04-04 ENCOUNTER — Telehealth: Payer: Self-pay | Admitting: Family Medicine

## 2018-04-04 NOTE — Telephone Encounter (Signed)
Patient is a regular patient of Dr. [name] Was seen at [facility] for COPD exacerbation on 04/04/18. Please contact patient to schedule a follow up appointment for him in one week.

## 2018-04-05 NOTE — Telephone Encounter (Signed)
Sending pt a mychart message about making an apt  Pt does already have an apt with Leslie Sims on 5/1

## 2018-04-07 ENCOUNTER — Encounter: Payer: Self-pay | Admitting: Family Medicine

## 2018-04-10 ENCOUNTER — Emergency Department: Payer: BLUE CROSS/BLUE SHIELD

## 2018-04-10 ENCOUNTER — Encounter: Payer: Self-pay | Admitting: Emergency Medicine

## 2018-04-10 ENCOUNTER — Other Ambulatory Visit: Payer: Self-pay

## 2018-04-10 ENCOUNTER — Emergency Department
Admission: EM | Admit: 2018-04-10 | Discharge: 2018-04-11 | Disposition: A | Discharge status: Home / Self Care | Payer: BLUE CROSS/BLUE SHIELD | Attending: Emergency Medicine | Admitting: Emergency Medicine

## 2018-04-10 DIAGNOSIS — F17210 Nicotine dependence, cigarettes, uncomplicated: Secondary | ICD-10-CM | POA: Diagnosis not present

## 2018-04-10 DIAGNOSIS — J441 Chronic obstructive pulmonary disease with (acute) exacerbation: Secondary | ICD-10-CM | POA: Diagnosis not present

## 2018-04-10 DIAGNOSIS — Z79899 Other long term (current) drug therapy: Secondary | ICD-10-CM | POA: Insufficient documentation

## 2018-04-10 DIAGNOSIS — I110 Hypertensive heart disease with heart failure: Secondary | ICD-10-CM | POA: Diagnosis not present

## 2018-04-10 DIAGNOSIS — R0602 Shortness of breath: Secondary | ICD-10-CM | POA: Diagnosis present

## 2018-04-10 DIAGNOSIS — F419 Anxiety disorder, unspecified: Secondary | ICD-10-CM | POA: Insufficient documentation

## 2018-04-10 DIAGNOSIS — I2510 Atherosclerotic heart disease of native coronary artery without angina pectoris: Secondary | ICD-10-CM | POA: Insufficient documentation

## 2018-04-10 DIAGNOSIS — I5022 Chronic systolic (congestive) heart failure: Secondary | ICD-10-CM | POA: Diagnosis not present

## 2018-04-10 DIAGNOSIS — F329 Major depressive disorder, single episode, unspecified: Secondary | ICD-10-CM | POA: Insufficient documentation

## 2018-04-10 DIAGNOSIS — I252 Old myocardial infarction: Secondary | ICD-10-CM | POA: Diagnosis not present

## 2018-04-10 DIAGNOSIS — Z7982 Long term (current) use of aspirin: Secondary | ICD-10-CM | POA: Diagnosis not present

## 2018-04-10 LAB — CBC
HEMATOCRIT: 41.3 % (ref 40.0–52.0)
Hemoglobin: 14.3 g/dL (ref 13.0–18.0)
MCH: 31.2 pg (ref 26.0–34.0)
MCHC: 34.5 g/dL (ref 32.0–36.0)
MCV: 90.4 fL (ref 80.0–100.0)
Platelets: 317 10*3/uL (ref 150–440)
RBC: 4.57 MIL/uL (ref 4.40–5.90)
RDW: 13.4 % (ref 11.5–14.5)
WBC: 10.5 10*3/uL (ref 3.8–10.6)

## 2018-04-10 LAB — BASIC METABOLIC PANEL
Anion gap: 8 (ref 5–15)
BUN: 20 mg/dL (ref 6–20)
CO2: 30 mmol/L (ref 22–32)
CREATININE: 0.65 mg/dL (ref 0.61–1.24)
Calcium: 9.6 mg/dL (ref 8.9–10.3)
Chloride: 98 mmol/L — ABNORMAL LOW (ref 101–111)
GFR calc Af Amer: >60 mL/min (ref >60)
Glucose, Bld: 116 mg/dL — ABNORMAL HIGH (ref 65–99)
Potassium: 4.2 mmol/L (ref 3.5–5.1)
SODIUM: 136 mmol/L (ref 135–145)

## 2018-04-10 LAB — TROPONIN I: Troponin I: <0.03 ng/mL (ref <0.03)

## 2018-04-10 IMAGING — CR DG CHEST 2V
1 series · 2 of 2 positions shown · non-contrast
Comparison: [DATE]

Correlation: CT chest [DATE]

CLINICAL DATA: Shortness of breath today, history COPD, bronchitis

EXAM:
CHEST - 2 VIEW

[Series 1: dg chest 2 view · 0.14mm/px · 2 of 2 slices shown]
[im 1/2]
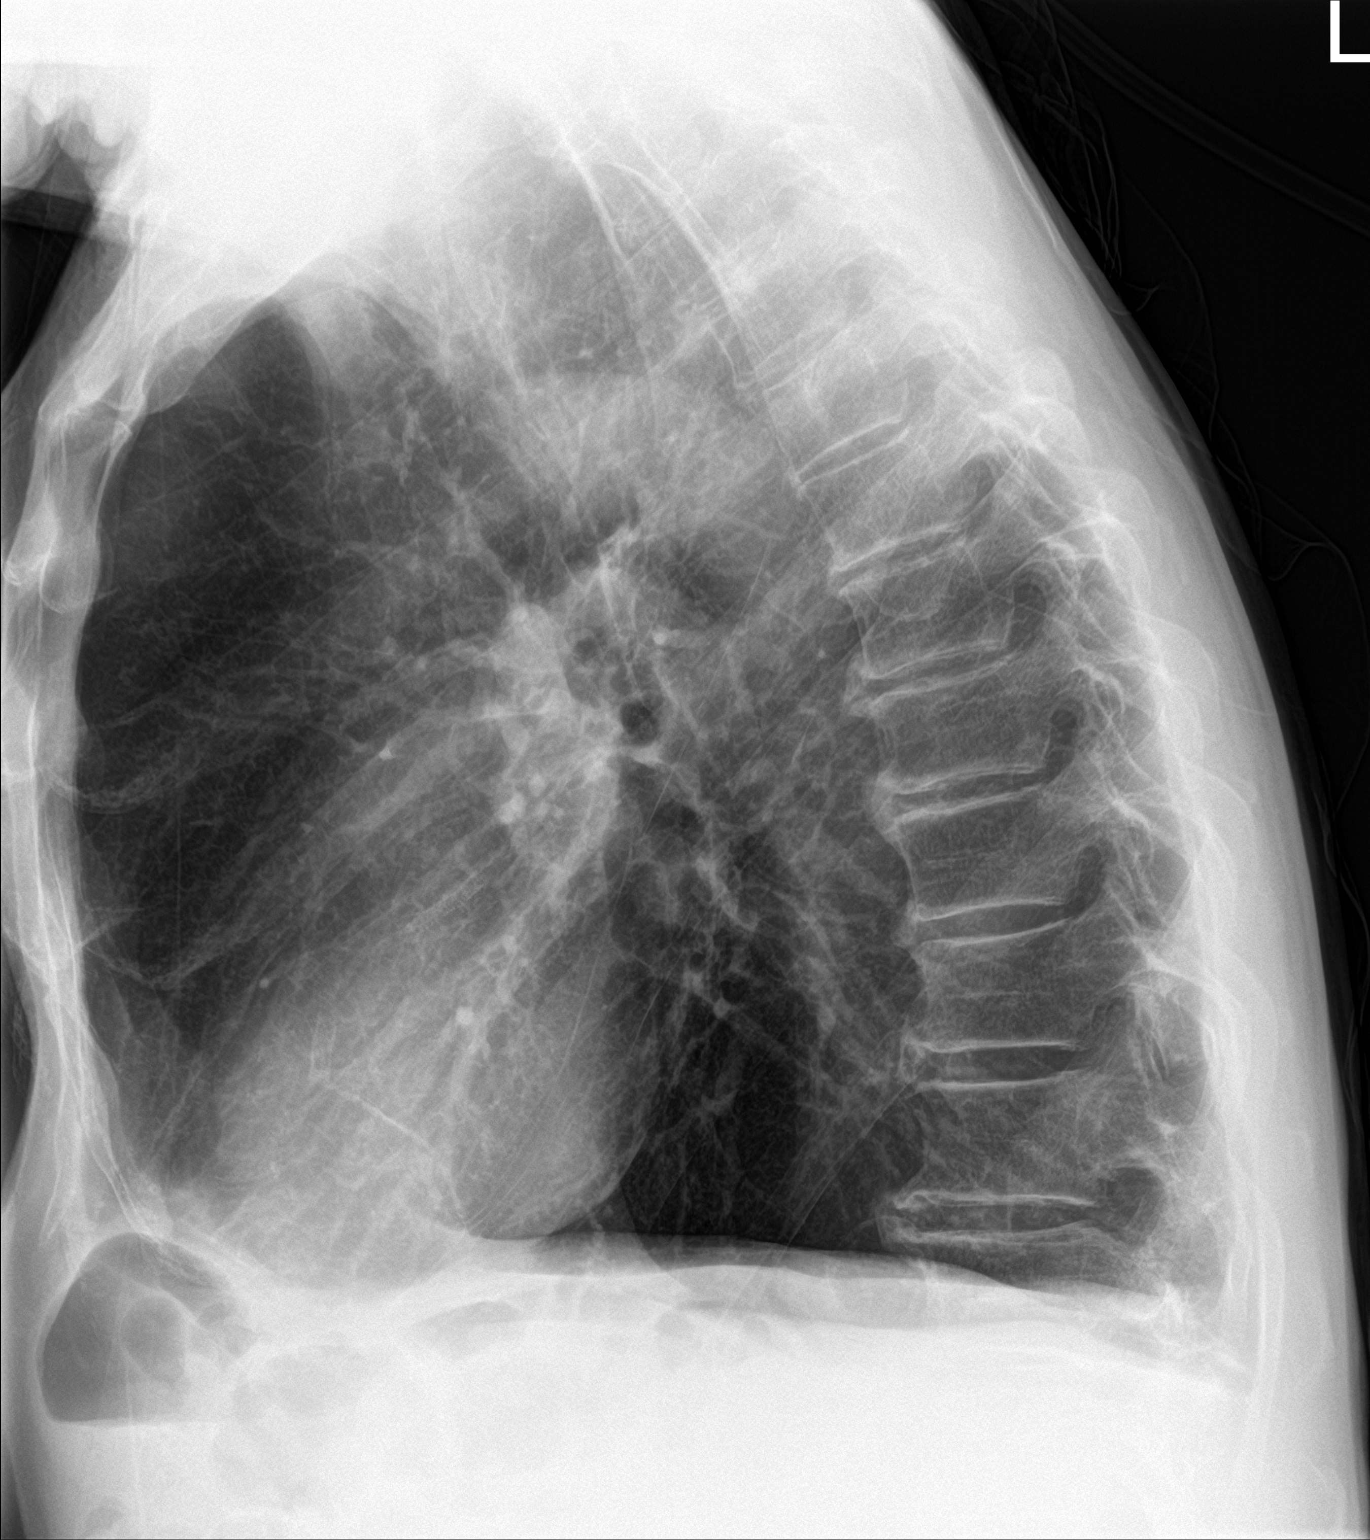
[im 2/2]
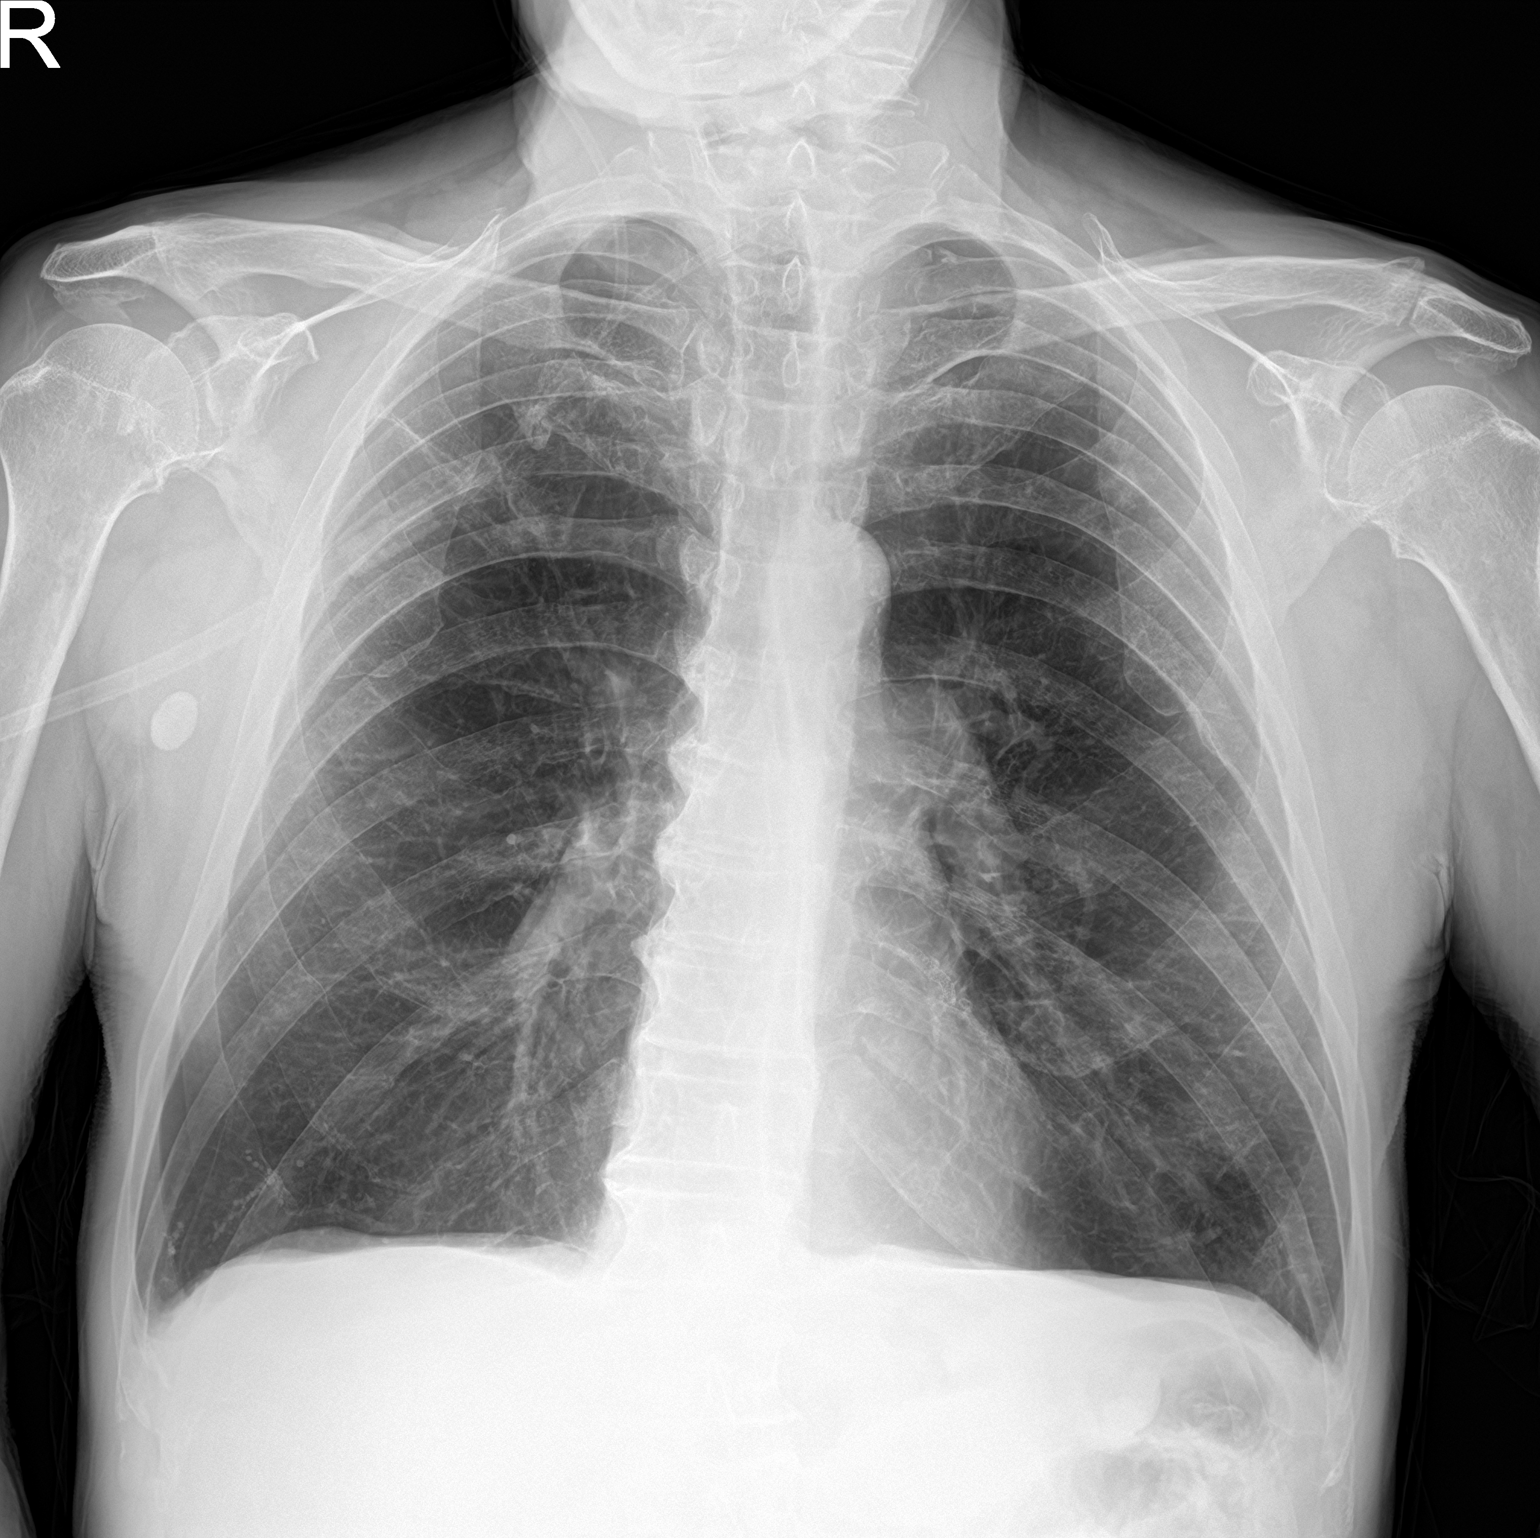

[2 of 2 positions shown; findings below may reference images not displayed]

FINDINGS: Normal heart size, mediastinal contours, and pulmonary vascularity.

Emphysematous changes consistent with COPD.

Somewhat linear opacity in the RIGHT upper lobe, question
atelectasis or scarring, appears decreased since prior CT.

Vague nodular density LEFT upper lobe again identified, 11 mm
diameter.

No infiltrate, pleural effusion or pneumothorax.

Tiny radiopacities at the RIGHT lung base question aspirated
contrast material within LEFT lower lobe bronchi.

Atherosclerotic calcifications aorta.

Bones demineralized.
IMPRESSION: COPD changes with decreased opacity in the RIGHT upper lobe question
atelectasis though requiring follow-up till complete resolution to
exclude mass.

Persistent LEFT upper lobe nodule 11 mm diameter.

## 2018-04-10 MED ORDER — PREDNISONE 20 MG PO TABS: ORAL_TABLET | ORAL | 0 refills | Status: AC

## 2018-04-10 MED ORDER — IPRATROPIUM-ALBUTEROL 0.5-2.5 (3) MG/3ML IN SOLN
3.0000 mL | Freq: Once | RESPIRATORY_TRACT | Status: AC
  Administered 2018-04-10: 3 mL via RESPIRATORY_TRACT
  Filled 2018-04-10: qty 3

## 2018-04-10 MED ORDER — METHYLPREDNISOLONE SODIUM SUCC 125 MG IJ SOLR
125.0000 mg | Freq: Once | INTRAMUSCULAR | Status: AC
Start: 2018-04-10 — End: 2018-04-10
  Administered 2018-04-10: 125 mg via INTRAVENOUS
  Filled 2018-04-10: qty 2

## 2018-04-10 NOTE — ED Notes (Signed)
Pt states that he went to PCP last week for shortness of breath - he was given Augmentin, prednisone, and claritin - he states that he has seen little improvement with the medication and continues to have shortness of breath with chest pain related to the difficulty breathing - reports it is a discomfort more than a pain

## 2018-04-10 NOTE — ED Provider Notes (Signed)
[facility] Emergency Department Provider Note ____________________________________________   First MD Initiated Contact with Patient 04/10/18 2120     (approximate)  I have reviewed the triage vital signs and the nursing notes.   HISTORY  Chief Complaint Shortness of Breath    HPI Leslie Sims is a 67 y.o. male with PMH as noted below including COPD on 2 L home O2 who presents with shortness of breath over the last several days, gradual onset, associated with nonproductive cough, not associated with fever or chest pain.  Patient states symptoms are similar to prior COPD symptoms.  He states that his symptoms initially worsened a few weeks ago when there was an increase in pollen, and about 10 days ago he was started on a prednisone course by his PMD which finishes today.  Patient states he felt somewhat better on prednisone but then felt worse several days ago.  He went to an urgent care and was started on Augmentin as well.  He states he has felt worse since he started that.   Past Medical History:  Diagnosis Date  . Allergic rhinitis   . Anxiety   . Atherosclerosis   . Back pain   . Bronchitis    09-14-11  . CARDIOMYOPATHY 03/24/2010   did not tolerate Toprol and Lisinopril.   . Chest pain   . CHF (congestive heart failure) ([address])   . Chronic airway obstruction, not elsewhere classified   . Colon polyps 08/2012   colonoscopy; multiple colon polyps; repeat colonoscopy in one year.  [name] COPD (chronic obstructive pulmonary disease) ([place])   . Coronary artery disease 11/2009   Anterior MI with late presentation 11/2009. Cath showed a 100% proximal LAD occlusion, 99% mid RCA. Had a PCI and 2 DES to both LAD and RCA. EF was 35%. Nuclear stress test 05/13: prior anterior/inferior infarcts without ischemic, EF 43%, cardiac cath in 02/14: Widely patent stents with no other obstructive disease. EF 40%   . Depression   . GERD (gastroesophageal reflux disease)     . Headache(784.0)   . Helicobacter pylori (H. pylori)   . Hemorrhoids   . Hernia    inguinal  . Hyperlipidemia   . Hypertension   . Insomnia   . MI (myocardial infarction) ([place])   . Myalgia   . Peptic ulcer   . Shortness of breath dyspnea   . Status post dilation of esophageal narrowing 2000  . Thyroid dysfunction   . Tinea pedis   . Wears dentures    partial upper    Patient Active Problem List   Diagnosis Date Noted  . Grief reaction 01/13/2018  . COPD with hypoxia ([place]) 12/25/2017  . Unstable angina ([place])   . Benign neoplasm of ascending colon   . Benign neoplasm of descending colon   . Benign neoplasm of sigmoid colon   . Personal history of colonic polyps   . Left upper lobe pulmonary nodule 01/05/2015  . Bruit of left carotid artery 11/04/2014  . Sleep disorder 07/18/2013  . Seasonal and perennial allergic rhinitis 05/29/2013  . Bradycardia 04/20/2011  . SMOKER 07/30/2010  . CAD, NATIVE VESSEL 04/23/2010  . Hyperlipidemia 03/24/2010  . DEPRESSION/ANXIETY 03/24/2010  . Chronic systolic heart failure ([place]) 03/24/2010  . COPD, very severe ([place]) 03/24/2010  . GERD 03/24/2010  . Essential hypertension 11/21/2009    Past Surgical History:  Procedure Laterality Date  . Admission  12/20/2012   COPD exacerbation.  [place].  [name] CARDIAC CATHETERIZATION    .  CARDIAC CATHETERIZATION  02/05/13   [facility]  . CARDIAC CATHETERIZATION  10/14   [facility] : patent stents with no change in anatomy. EF: 40$  . CARDIAC CATHETERIZATION Left 11/12/2016   Procedure: Left Heart Cath and Coronary Angiography;  Surgeon: Wellington Hampshire, MD;  Location: [facility];  Service: Cardiovascular;  Laterality: Left;  . COLONOSCOPY    . COLONOSCOPY WITH PROPOFOL N/A 05/18/2016   Procedure: COLONOSCOPY WITH PROPOFOL;  Surgeon: Lucilla Lame, MD;  Location: [facility] ENDOSCOPY;  Service: Endoscopy;  Laterality: N/A;  . CORONARY ANGIOPLASTY WITH STENT PLACEMENT  09/2009   LAD 3.0 X23 mm Xience DES, RCA:  4.0 X 15 mm Xience DES  . ELECTROMAGNETIC NAVIGATION BROCHOSCOPY N/A 10/27/2017   Procedure: ELECTROMAGNETIC NAVIGATION BRONCHOSCOPY;  Surgeon: Flora Lipps, MD;  Location: [facility] ORS;  Service: Cardiopulmonary;  Laterality: N/A;  . ESOPHAGEAL DILATION    . ESOPHAGOGASTRODUODENOSCOPY  2008  . ESOPHAGOGASTRODUODENOSCOPY  08/20/2012  . HERNIA REPAIR  07/20/2012   L inguinal hernia repair  . PENILE PROSTHESIS IMPLANT      Prior to Admission medications   Medication Sig Start Date End Date Taking? Authorizing Provider  albuterol (VENTOLIN HFA) 108 (90 Base) MCG/ACT inhaler USE 2 INHALATIONS EVERY 6 HOURS AS NEEDED FOR WHEEZING 08/19/17   [name], MD  aspirin 81 MG tablet Take 81 mg by mouth daily.      [provider]  atorvastatin (LIPITOR) 40 MG tablet Take 1 tablet (40 mg total) by mouth daily. 03/03/18   [name], MD  FLUoxetine (PROZAC) 20 MG tablet Take 1 tablet (20 mg total) by mouth daily. 03/03/18   [name], MD  Fluticasone-Salmeterol (ADVAIR DISKUS) 500-50 MCG/DOSE AEPB Inhale 1 puff into the lungs 2 (two) times daily. 07/27/17   [name], MD  furosemide (LASIX) 20 MG tablet Take 1 tablet (20 mg total) by mouth daily. 12/30/17 02/10/18  Max Sane, MD  ipratropium-albuterol (DUONEB) 0.5-2.5 (3) MG/3ML SOLN Take 3 mLs by nebulization every 6 (six) hours as needed. 01/18/18   [name], MD  ketoconazole (NIZORAL) 2 % cream Apply 1 application topically 2 (two) times daily. 08/19/17   [name], MD  LORazepam (ATIVAN) 1 MG tablet TAKE 1/2 TABLET BY MOUTH EVERY 6 HOURS AS NEEDED FOR ANXIETY 03/16/18   [name], MD  losartan (COZAAR) 25 MG tablet TAKE 1 TABLET DAILY 01/27/18   Minna Merritts, MD  mirtazapine (REMERON) 15 MG tablet Take 1 tablet (15 mg total) by mouth at bedtime. 10/05/17   [name], MD  Multiple Vitamin (MULTIVITAMIN) capsule Take 1 capsule by mouth daily.    [provider]  nitroGLYCERIN (NITROSTAT) 0.4 MG SL  tablet Place 1 tablet (0.4 mg total) under the tongue every 5 (five) minutes as needed. 10/17/17   Wellington Hampshire, MD  predniSONE (DELTASONE) 20 MG tablet Take 3 tablets (60 mg total) by mouth daily for 4 days, THEN 2 tablets (40 mg total) daily for 4 days, THEN 1 tablet (20 mg total) daily for 4 days. 04/11/18 04/23/18  Arta Silence, MD  sertraline (ZOLOFT) 100 MG tablet Take 1 tablet (100 mg total) by mouth at bedtime. 11/30/17   [name], MD  sucralfate (CARAFATE) 1 GM/10ML suspension Take 10 mLs (1 g total) by mouth 4 (four) times daily -  with meals and at bedtime. 03/03/18   [name], MD  tiotropium (SPIRIVA HANDIHALER) 18 MCG inhalation capsule Place 1 capsule (  18 mcg total) into inhaler and inhale daily. 07/27/17   [name], MD    Allergies Effexor xr Henrene Hawking hcl er] and Wellbutrin [bupropion]  Family History  Problem Relation Age of Onset  . Heart attack Brother        Brother #1  . Diabetes Brother   . Hypertension Brother        #3  . Coronary artery disease Father 66       deceased  . Heart attack Father   . Diabetes Father   . Heart disease Father   . COPD Mother 69       deceased  . Alcohol abuse Sister        polysubstance abuse  . COPD Sister   . Lung cancer Sister   . Alcohol abuse Sister        polysubstance abuse  . Penile cancer Brother   . Diabetes Brother     Social History Social History   Tobacco Use  . Smoking status: Current Every Day Smoker    Packs/day: 0.50    Years: 42.00    Pack years: 21.00    Types: Cigarettes  . Smokeless tobacco: Never Used  . Tobacco comment: 02/15/18 For tobacco cessation he is getting better.  He is staying between 6-18 cigarettes a day versus a full pack.  He is also set up to start a one on one smoking cessation class since the current classes were already in progress and did not restart un  Substance Use Topics  . Alcohol use: No    Alcohol/week: 0.0 oz  . Drug use: No    Review  of Systems  Constitutional: No fever. Eyes: No redness. ENT: No sore throat. Cardiovascular: Denies chest pain. Respiratory: Positive for shortness of breath. Gastrointestinal: No nausea, no vomiting.  No diarrhea.  Genitourinary: Negative for dysuria.  Musculoskeletal: Negative for back pain. Skin: Negative for rash. Neurological: Negative for headache.   ____________________________________________   PHYSICAL EXAM:  VITAL SIGNS: ED Triage Vitals  Enc Vitals Group     BP 04/10/18 1729 118/71     Pulse Rate 04/10/18 1729 (!) 104     Resp 04/10/18 1729 (!) 22     Temp 04/10/18 1729 98.6 F (37 C)     Temp Source 04/10/18 1729 Oral     SpO2 04/10/18 1729 97 %     Weight 04/10/18 1730 140 lb (63.5 kg)     Height 04/10/18 1730 5\' 10"  (1.778 m)     Head Circumference --      Peak Flow --      Pain Score 04/10/18 1730 0     Pain Loc --      Pain Edu? --      Excl. in [place]? --     Constitutional: Alert and oriented. Well appearing and in no acute distress. Eyes: Conjunctivae are normal.  Head: Atraumatic. Nose: No congestion/rhinnorhea. Mouth/Throat: Mucous membranes are slightly dry.   Neck: Normal range of motion.  Cardiovascular: Normal rate, regular rhythm. Grossly normal heart sounds.  Good peripheral circulation. Respiratory: Normal respiratory effort.  No retractions.  Decreased breath sounds bilaterally with trace wheezing. Gastrointestinal: No distention.  Genitourinary: No CVA tenderness. Musculoskeletal: No lower extremity edema.  Extremities warm and well perfused.  Neurologic:  Normal speech and language. No gross focal neurologic deficits are appreciated.  Skin:  Skin is warm and dry. No rash noted. Psychiatric: Mood and affect are normal. Speech and behavior are normal.  ____________________________________________   LABS (all labs ordered are listed, but only abnormal results are displayed)  Labs Reviewed  BASIC METABOLIC PANEL - Abnormal; Notable  for the following components:      Result Value   Chloride 98 (*)    Glucose, Bld 116 (*)    All other components within normal limits  CBC  TROPONIN I   ____________________________________________  EKG  ED ECG REPORT I, Arta Silence, the attending physician, personally viewed and interpreted this ECG.  Date: 04/10/2018 EKG Time: 1736 Rate: 97 Rhythm: normal sinus rhythm QRS Axis: normal Intervals: LAFB ST/T Wave abnormalities: Nonspecific anterolateral abnormalities Narrative Interpretation: no evidence of acute ischemia; no significant change when compared to EKG of 12/23/2017  ____________________________________________  RADIOLOGY  CXR: No focal infiltrate; left upper lobe opacity likely atelectasis  ____________________________________________   PROCEDURES  Procedure(s) performed: No  Procedures  Critical Care performed: No ____________________________________________   INITIAL IMPRESSION / ASSESSMENT AND PLAN / ED COURSE  Pertinent labs & imaging results that were available during my care of the patient were reviewed by me and considered in my medical decision making (see chart for details).  67 year old male with history of COPD and other PMH as noted above presents with worsening shortness of breath and cough primarily over the last several days.  Patient states initially symptoms worsen several weeks ago due to pollen, and he was put on a steroid course which ends today.  Symptoms have worsened in the last few days despite this, as well as an antibiotic he was started on several days ago.  Patient showed me his medication; the prednisone was a taper of 40 mg for 5 days and then 20 mg for 5 days.  On exam, the patient is well-appearing, O2 sat is in the high 90s on his normal 2 L by nasal cannula, and the remainder the exam is as described above.  No respiratory distress.  Patient's chest x-ray shows some possible atelectasis but no focal infiltrate,  and his lab work-up is unremarkable.  There is no evidence of infectious cause.  Presentation is entirely consistent with COPD exacerbation.  Since the steroid did initially help but then the symptoms worsened when he tapered down, I will give a dose of Solu-Medrol here in the ED and then send him home with a burst of 60 mg for several days and then taper down.  I will give a neb treatment here as well.  Plan to reassess after these medications and likely discharge home.    ----------------------------------------- 11:12 PM on 04/10/2018 -----------------------------------------  Patient is feeling well after Solu-Medrol and nebs.  He would like to go home.  I will prescribe steroid taper as mentioned above.  He agrees to follow-up with his primary care doctor.  Return precautions given, and he expresses understanding.  ____________________________________________   FINAL CLINICAL IMPRESSION(S) / ED DIAGNOSES  Final diagnoses:  COPD exacerbation ([place])      NEW MEDICATIONS STARTED DURING THIS VISIT:  New Prescriptions   PREDNISONE (DELTASONE) 20 MG TABLET    Take 3 tablets (60 mg total) by mouth daily for 4 days, THEN 2 tablets (40 mg total) daily for 4 days, THEN 1 tablet (20 mg total) daily for 4 days.     Note:  This document was prepared using Dragon voice recognition software and may include unintentional dictation errors.    Arta Silence, MD 04/10/18 [phone]

## 2018-04-10 NOTE — ED Notes (Signed)

## 2018-04-10 NOTE — ED Notes (Signed)
Pt waiting for ride.

## 2018-04-10 NOTE — ED Triage Notes (Signed)
Pt reports shortness of breath today, hx of copd and bronchitis. Pt normally wears 2L via [place], has it on 3L and sats are 99%. Pt currently taking amoxicillin and prednisone. Pt reports sx have gotten worse since taking amoxicillin.

## 2018-04-10 NOTE — Discharge Instructions (Signed)
Take the prednisone taper as prescribed starting tomorrow, with 3 pills daily for 4 days, then 2 pills for 4 days, then 1 pill for 4 days.  You should continue to use your albuterol inhaler or nebulizer every 4-6 hours for the next several days.  Return to the ER for new, worsening, or persistent difficulty breathing, chest pain, fever, weakness, low oxygen readings, or any other new or worsening symptoms that concern you.  Follow-up with your primary care doctor within the next week.

## 2018-04-11 ENCOUNTER — Ambulatory Visit: Payer: BLUE CROSS/BLUE SHIELD | Admitting: Psychology

## 2018-04-12 ENCOUNTER — Encounter: Payer: Self-pay | Admitting: *Deleted

## 2018-04-12 ENCOUNTER — Telehealth: Payer: Self-pay | Admitting: *Deleted

## 2018-04-12 DIAGNOSIS — J449 Chronic obstructive pulmonary disease, unspecified: Secondary | ICD-10-CM

## 2018-04-12 NOTE — Telephone Encounter (Signed)
Called Minor to check on status of return.  He has been out after having a difficult time breathing with the pollen. He was in the ED on Monday.  He was started on new medication and is trying to get a follow up appointment with his PCP and will return when he is able.  He wants to finish the program this time!

## 2018-04-17 ENCOUNTER — Ambulatory Visit: Payer: BLUE CROSS/BLUE SHIELD | Admitting: Psychology

## 2018-04-17 DIAGNOSIS — F411 Generalized anxiety disorder: Secondary | ICD-10-CM | POA: Diagnosis not present

## 2018-04-17 DIAGNOSIS — F331 Major depressive disorder, recurrent, moderate: Secondary | ICD-10-CM

## 2018-04-18 ENCOUNTER — Ambulatory Visit (INDEPENDENT_AMBULATORY_CARE_PROVIDER_SITE_OTHER): Payer: BLUE CROSS/BLUE SHIELD | Admitting: Cardiovascular Disease

## 2018-04-18 ENCOUNTER — Encounter: Payer: Self-pay | Admitting: Cardiovascular Disease

## 2018-04-18 VITALS — BP 122/60 | HR 106 | Ht 70.0 in | Wt 139.0 lb

## 2018-04-18 DIAGNOSIS — I5022 Chronic systolic (congestive) heart failure: Secondary | ICD-10-CM | POA: Diagnosis not present

## 2018-04-18 DIAGNOSIS — E7800 Pure hypercholesterolemia, unspecified: Secondary | ICD-10-CM

## 2018-04-18 DIAGNOSIS — I2510 Atherosclerotic heart disease of native coronary artery without angina pectoris: Secondary | ICD-10-CM

## 2018-04-18 DIAGNOSIS — Z720 Tobacco use: Secondary | ICD-10-CM

## 2018-04-18 NOTE — Patient Instructions (Signed)
Medication Instructions: Continue same medications.   Labwork: None.   Procedures/Testing: None.   Follow-Up: 6 months with Dr. Arida.   Any Additional Special Instructions Will Be Listed Below (If Applicable).     If you need a refill on your cardiac medications before your next appointment, please call your pharmacy.   

## 2018-04-18 NOTE — Progress Notes (Signed)
Cardiology Office Note   Date:  04/18/2018   ID:  Leslie Sims, DOB 01/23/1951, MRN [id number]  PCP:  [name], MD  Cardiologist:   Kathlyn Sacramento, MD   Chief Complaint  Patient presents with  . Other    6 month follow up. Patient c/o SOB. Meds reviewed verbally with patient.       History of Present Illness: Leslie Sims is a 67 y.o. male who presents for a follow-up visit regarding coronary artery disease and ischemic cardiomyopathy. He had previous anterior myocardial infarction in December of 2010. His LAD was occluded and RCA had a 99% stenosis. He had an angioplasty and drug-eluting stent placement to both LAD and RCA at that time. 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  Carotid Doppler in November, 2015 showed only mild bilateral carotid stenosis which remained stable in 2017. Most recent cardiac catheterization in November 2017 showed patent LAD and RCA stents with no evidence of obstructive disease. Ejection fraction was 35-40% with severe anterior and apical hypokinesis. Left ventricular end-diastolic pressure was moderately elevated. Imdur did not help with his symptoms.   Unfortunately, his wife died last year followed by his sister about 3 months later.  Since then, he has suffered from major anxiety and depression with significant grief.  He is going to see psychiatry in the near future.  He continues to have significant exertional dyspnea with no chest pain.  Unfortunately, he has not been able to cut down on smoking or quit.   Past Medical History:  Diagnosis Date  . Allergic rhinitis   . Anxiety   . Atherosclerosis   . Back pain   . Bronchitis    09-14-11  . CARDIOMYOPATHY 03/24/2010   did not tolerate Toprol and Lisinopril.   . Chest pain   . CHF (congestive heart failure) ([place])   . Chronic airway obstruction, not elsewhere classified   . Colon polyps 08/2012   colonoscopy; multiple colon polyps; repeat colonoscopy in  one year.  [name] COPD (chronic obstructive pulmonary disease) ([place])   . Coronary artery disease 11/2009   Anterior MI with late presentation 11/2009. Cath showed a 100% proximal LAD occlusion, 99% mid RCA. Had a PCI and 2 DES to both LAD and RCA. EF was 35%. Nuclear stress test 05/13: prior anterior/inferior infarcts without ischemic, EF 43%, cardiac cath in 02/14: Widely patent stents with no other obstructive disease. EF 40%   . Depression   . GERD (gastroesophageal reflux disease)   . Headache(784.0)   . Helicobacter pylori (H. pylori)   . Hemorrhoids   . Hernia    inguinal  . Hyperlipidemia   . Hypertension   . Insomnia   . MI (myocardial infarction) ([place])   . Myalgia   . Peptic ulcer   . Shortness of breath dyspnea   . Status post dilation of esophageal narrowing 2000  . Thyroid dysfunction   . Tinea pedis   . Wears dentures    partial upper    Past Surgical History:  Procedure Laterality Date  . Admission  12/20/2012   COPD exacerbation.  [place].  [name] CARDIAC CATHETERIZATION    . CARDIAC CATHETERIZATION  02/05/13   [facility]  . CARDIAC CATHETERIZATION  10/14   [facility] : patent stents with no change in anatomy. EF: 40$  . CARDIAC CATHETERIZATION Left 11/12/2016   Procedure: Left Heart Cath and Coronary Angiography;  Surgeon: Wellington Hampshire, MD;  Location: [facility]ASIVE [facility]  LAB;  Service: Cardiovascular;  Laterality: Left;  . COLONOSCOPY    . COLONOSCOPY WITH PROPOFOL N/A 05/18/2016   Procedure: COLONOSCOPY WITH PROPOFOL;  Surgeon: Lucilla Lame, MD;  Location: [facility] ENDOSCOPY;  Service: Endoscopy;  Laterality: N/A;  . CORONARY ANGIOPLASTY WITH STENT PLACEMENT  09/2009   LAD 3.0 X23 mm Xience DES, RCA: 4.0 X 15 mm Xience DES  . ELECTROMAGNETIC NAVIGATION BROCHOSCOPY N/A 10/27/2017   Procedure: ELECTROMAGNETIC NAVIGATION BRONCHOSCOPY;  Surgeon: Flora Lipps, MD;  Location: [facility] ORS;  Service: Cardiopulmonary;  Laterality: N/A;  . ESOPHAGEAL DILATION    . ESOPHAGOGASTRODUODENOSCOPY  2008  .  ESOPHAGOGASTRODUODENOSCOPY  08/20/2012  . HERNIA REPAIR  07/20/2012   L inguinal hernia repair  . PENILE PROSTHESIS IMPLANT       Current Outpatient Medications  Medication Sig Dispense Refill  . albuterol (VENTOLIN HFA) 108 (90 Base) MCG/ACT inhaler USE 2 INHALATIONS EVERY 6 HOURS AS NEEDED FOR WHEEZING 48 g 3  . aspirin 81 MG tablet Take 81 mg by mouth daily.      [name] atorvastatin (LIPITOR) 40 MG tablet Take 1 tablet (40 mg total) by mouth daily. 90 tablet 3  . FLUoxetine (PROZAC) 20 MG tablet Take 1 tablet (20 mg total) by mouth daily. 30 tablet 3  . Fluticasone-Salmeterol (ADVAIR DISKUS) 500-50 MCG/DOSE AEPB Inhale 1 puff into the lungs 2 (two) times daily. 180 each 3  . ipratropium-albuterol (DUONEB) 0.5-2.5 (3) MG/3ML SOLN Take 3 mLs by nebulization every 6 (six) hours as needed. 225 mL 3  . ketoconazole (NIZORAL) 2 % cream Apply 1 application topically 2 (two) times daily. 60 g 0  . LORazepam (ATIVAN) 1 MG tablet TAKE 1/2 TABLET BY MOUTH EVERY 6 HOURS AS NEEDED FOR ANXIETY 60 tablet 2  . losartan (COZAAR) 25 MG tablet TAKE 1 TABLET DAILY 90 tablet 3  . mirtazapine (REMERON) 15 MG tablet Take 1 tablet (15 mg total) by mouth at bedtime. 30 tablet 5  . Multiple Vitamin (MULTIVITAMIN) capsule Take 1 capsule by mouth daily.    . nitroGLYCERIN (NITROSTAT) 0.4 MG SL tablet Place 1 tablet (0.4 mg total) under the tongue every 5 (five) minutes as needed. 25 tablet 3  . predniSONE (DELTASONE) 20 MG tablet Take 3 tablets (60 mg total) by mouth daily for 4 days, THEN 2 tablets (40 mg total) daily for 4 days, THEN 1 tablet (20 mg total) daily for 4 days. 24 tablet 0  . sucralfate (CARAFATE) 1 GM/10ML suspension Take 10 mLs (1 g total) by mouth 4 (four) times daily -  with meals and at bedtime. 420 mL 4  . tiotropium (SPIRIVA HANDIHALER) 18 MCG inhalation capsule Place 1 capsule (18 mcg total) into inhaler and inhale daily. 90 capsule 3  . furosemide (LASIX) 20 MG tablet Take 1 tablet (20 mg total) by  mouth daily. 30 tablet 0   No current facility-administered medications for this visit.     Allergies:   Effexor xr [venlafaxine hcl er] and Wellbutrin [bupropion]    Social History:  The patient  reports that he has been smoking cigarettes.  He has a 21.00 pack-year smoking history. He has never used smokeless tobacco. He reports that he does not drink alcohol or use drugs.   Family History:  The patient's family history includes Alcohol abuse in his sister and sister; COPD in his sister; COPD (age of onset: 72) in his mother; Coronary artery disease (age of onset: 24) in his father; Diabetes in his brother, brother, and father; Heart  attack in his brother and father; Heart disease in his father; Hypertension in his brother; Lung cancer in his sister; Penile cancer in his brother.    ROS:  Please see the history of present illness.   Otherwise, review of systems are positive for none.   All other systems are reviewed and negative.    PHYSICAL EXAM: VS:  BP 122/60 (BP Location: Left Arm, Patient Position: Sitting, Cuff Size: Normal)   Pulse (!) 106   Ht 5\' 10"  (1.778 m)   Wt 139 lb (63 kg)   BMI 19.94 kg/m  , BMI Body mass index is 19.94 kg/m. GEN: Well nourished, well developed, in no acute distress  HEENT: normal  Neck: no JVD, carotid bruits, or masses Cardiac: RRR; no murmurs, rubs, or gallops,no edema  Respiratory:  clear to auscultation bilaterally With diminished breath sounds, normal work of breathing GI: soft, nontender, nondistended, + BS MS: no deformity or atrophy  Skin: warm and dry, no rash Neuro:  Strength and sensation are intact Psych: euthymic mood, full affect  EKG:  EKG is ordered today. The ekg ordered today demonstrates sinus tachycardia with left anterior fascicular block and old anterior septal infarct.   Recent Labs: 06/17/2017: TSH 3.330 12/23/2017: B Natriuretic Peptide 49.0 12/25/2017: ALT 29 04/10/2018: BUN 20; Creatinine, Ser 0.65; Hemoglobin 14.3;  Platelets 317; Potassium 4.2; Sodium 136    Lipid Panel    Component Value Date/Time   CHOL 143 07/27/2017 1525   TRIG 72 07/27/2017 1525   TRIG 65 01/15/2010   HDL 62 07/27/2017 1525   CHOLHDL 2.3 07/27/2017 1525   CHOLHDL 1.8 08/24/2016 1344   VLDL 25 08/24/2016 1344   LDLCALC 67 07/27/2017 1525      Wt Readings from Last 3 Encounters:  04/18/18 139 lb (63 kg)  04/10/18 140 lb (63.5 kg)  03/03/18 142 lb (64.4 kg)        ASSESSMENT AND PLAN:  1.  Coronary artery disease involving native coronary arteries without angina:    He is stable from a cardiac standpoint.  Continue medical therapy.  2.  Chronic systolic heart failure: Most recent ejection fraction was 35-40. He did not tolerate treatment with a beta blocker due to worsening dyspnea.  Currently on low-dose losartan.  He appears to be euvolemic on small dose of furosemide.  3. Hyperlipidemia: Continue treatment with atorvastatin. Most recent LDL in August 2018 was 67.   4. Tobacco use: Unfortunately, he has not been able to quit smoking in the setting of severe anxiety.  5. Severe anxiety and depression: I agree with psychiatric evaluation considering the severity of his symptoms.   Disposition:   FU with me in 6 months  Signed,  Kathlyn Sacramento, MD  04/18/2018 4:27 PM    [place]

## 2018-04-19 ENCOUNTER — Ambulatory Visit: Payer: BLUE CROSS/BLUE SHIELD | Admitting: Family Medicine

## 2018-04-19 ENCOUNTER — Other Ambulatory Visit: Payer: Self-pay

## 2018-04-19 ENCOUNTER — Encounter: Payer: BLUE CROSS/BLUE SHIELD | Attending: Pulmonary Disease

## 2018-04-19 ENCOUNTER — Encounter: Payer: Self-pay | Admitting: Family Medicine

## 2018-04-19 VITALS — BP 100/68 | HR 112 | Temp 98.0°F | Resp 16 | Ht 70.0 in | Wt 139.0 lb

## 2018-04-19 DIAGNOSIS — J9611 Chronic respiratory failure with hypoxia: Secondary | ICD-10-CM | POA: Insufficient documentation

## 2018-04-19 DIAGNOSIS — F341 Dysthymic disorder: Secondary | ICD-10-CM | POA: Diagnosis not present

## 2018-04-19 DIAGNOSIS — J449 Chronic obstructive pulmonary disease, unspecified: Secondary | ICD-10-CM | POA: Diagnosis not present

## 2018-04-19 DIAGNOSIS — F17200 Nicotine dependence, unspecified, uncomplicated: Secondary | ICD-10-CM | POA: Diagnosis not present

## 2018-04-19 DIAGNOSIS — F4321 Adjustment disorder with depressed mood: Secondary | ICD-10-CM

## 2018-04-19 DIAGNOSIS — I2510 Atherosclerotic heart disease of native coronary artery without angina pectoris: Secondary | ICD-10-CM | POA: Diagnosis not present

## 2018-04-19 DIAGNOSIS — I5022 Chronic systolic (congestive) heart failure: Secondary | ICD-10-CM | POA: Diagnosis not present

## 2018-04-19 NOTE — Patient Instructions (Addendum)
  Change LORAZEPAM/nerve pill to one whole tablet every morning upon awakening and one whole tablet around 2:00pm/midday. Start fluoxetine 20mg  every other day and stop morning sertraline/zoloft; continue to take sertraline/Zoloft 100mg  at beditme. When you increase fluoxetine to 20mg  every day, decrease sertraline/zoloft to 50mg  at bedtime.  Restart Mirtazipine 15mg  at bedtime/seleeping pill.   IF you received an x-ray today, you will receive an invoice from [facility]. Please contact [facility] at [phone] with questions or concerns regarding your invoice.   IF you received labwork today, you will receive an invoice from [facility]. Please contact [facility] at [phone] with questions or concerns regarding your invoice.   Our billing staff will not be able to assist you with questions regarding bills from these companies.  You will be contacted with the lab results as soon as they are available. The fastest way to get your results is to activate your My Chart account. Instructions are located on the last page of this paperwork. If you have not heard from [place] regarding the results in 2 weeks, please contact this office.

## 2018-04-19 NOTE — Progress Notes (Signed)
Subjective:    Patient ID: Leslie Sims, male    DOB: 01-Nov-1951, 67 y.o.   MRN: [id number]  04/19/2018  Depression (follow-up )    HPI This 67 y.o. male presents for evaluation of ANXIETY.  Also taking Prednisone.  No motivation; two weeks without pulmonary rehab. Getting out every day; sits on porch with dogs. Tries to get out; drives to get hamburger.  Underwent cardiology follow-up yesterday; heart rate up.  No changes. Scheduled to see Dr. Jake Michaelis on 05/03/18.  Cancellation list.   Has been back to see therapist; not beneficial.  Buena Irish, MSW, LCSW [phone].   Last visit with him on Monday.  Might feel a little better but not much.   Gave paper to complete and threw on table; too much. Overwhelmed.  No SI.  Feels like giving up.  Cannot put finger on it.  Niece came over the other day.   Ran around on wife one time a long time ago; never would let wife tear pictures up; not the source of anxiety.   Cannot dig through her stuff yet.  Kennyth Lose knew it but didn't know it for sure; Kennyth Lose talked to her.  Has not thought about it much.  Has asked for forgiveness.   STOPPED PROZAC 20MG  .  Currently taking Sertraline 50mg  and 100mg  daily.   Taking nerve pill four 1/2 daily; 1/2 upon awakening; then 10:00 takes another 1/2; then took another 1/2; then take another 8:00.  Stopped Mirtazipine.  Tosses and turns.  Sleeping a lot.  Chronic restless sleep.   Will take one whole tablet in the morning of Lorazepam.    Breathing is much better; has five more days of prednisone.  Running 95% room air.    Has gotten hooked on [employer]. Anxiety has interfered with ability to get things done.    BP Readings from Last 3 Encounters:  07/07/18 112/68  07/03/18 (!) 92/53  06/01/18 102/62   Wt Readings from Last 3 Encounters:  07/07/18 137 lb (62.1 kg)  07/02/18 137 lb 9.6 oz (62.4 kg)  06/01/18 137 lb (62.1 kg)   Immunization History  Administered Date(s)  Administered  . Influenza Whole 09/19/2012  . Influenza,inj,Quad PF,6+ Mos 10/24/2013, 11/25/2014, 03/02/2016, 10/27/2016, 10/05/2017  . Pneumococcal Conjugate-13 11/25/2014  . Pneumococcal Polysaccharide-23 08/24/2016, 10/27/2016  . Pneumococcal-Unspecified 12/20/2008  . Tdap 12/20/2008    Review of Systems  Constitutional: Negative for activity change, appetite change, chills, diaphoresis, fatigue, fever and unexpected weight change.  HENT: Negative for congestion, dental problem, drooling, ear discharge, ear pain, facial swelling, hearing loss, mouth sores, nosebleeds, postnasal drip, rhinorrhea, sinus pressure, sneezing, sore throat, tinnitus, trouble swallowing and voice change.   Eyes: Negative for photophobia, pain, discharge, redness, itching and visual disturbance.  Respiratory: Positive for shortness of breath and wheezing. Negative for apnea, cough, choking, chest tightness and stridor.   Cardiovascular: Negative for chest pain, palpitations and leg swelling.  Gastrointestinal: Negative for abdominal pain, blood in stool, constipation, diarrhea, nausea and vomiting.  Endocrine: Negative for cold intolerance, heat intolerance, polydipsia, polyphagia and polyuria.  Genitourinary: Negative for decreased urine volume, difficulty urinating, discharge, dysuria, enuresis, flank pain, frequency, genital sores, hematuria, penile pain, penile swelling, scrotal swelling, testicular pain and urgency.  Musculoskeletal: Negative for arthralgias, back pain, gait problem, joint swelling, myalgias, neck pain and neck stiffness.  Skin: Negative for color change, pallor, rash and wound.  Allergic/Immunologic: Negative for environmental allergies, food allergies and immunocompromised state.  Neurological: Negative  for dizziness, tremors, seizures, syncope, facial asymmetry, speech difficulty, weakness, light-headedness, numbness and headaches.  Hematological: Negative for adenopathy. Does not  bruise/bleed easily.  Psychiatric/Behavioral: Positive for dysphoric mood. Negative for agitation, behavioral problems, confusion, decreased concentration, hallucinations, self-injury, sleep disturbance and suicidal ideas. The patient is nervous/anxious. The patient is not hyperactive.     Past Medical History:  Diagnosis Date  . Allergic rhinitis   . Anxiety   . Back pain   . Colon polyps 08/2012   colonoscopy; multiple colon polyps; repeat colonoscopy in one year.  [name] COPD (chronic obstructive pulmonary disease) ([place])   . Coronary artery disease 11/2009   a. late presenting ant MI; b. LHC 100% pLAD s/p PCI/DES, 99% mRCA s/p PCI/DES, EF 35%; c. nuclear stress test 05/13: prior ant/inf infarcts w/o ischemia, EF 43%; d. LHC 02/14: widely patent stents with no other obs dz, EF 40%; e. LHC 11/17: LM nl, mLAD 10%, patent LAD stent, dLAD 20%, p-mRCA 10%, mRCA 40%, patent RCA stent, EF 35-45%   . Depression   . GERD (gastroesophageal reflux disease)   . Headache(784.0)   . Helicobacter pylori (H. pylori)   . Hemorrhoids   . Hernia    inguinal  . Hyperlipidemia   . Hypertension   . Insomnia   . Ischemic cardiomyopathy   . Myalgia   . Peptic ulcer   . ST elevation (STEMI) myocardial infarction involving left anterior descending coronary artery ([place]) 11/2009   a. s/p PCI to the LAD  . Status post dilation of esophageal narrowing 2000  . Tinea pedis   . Wears dentures    partial upper   Past Surgical History:  Procedure Laterality Date  . Admission  12/20/2012   COPD exacerbation.  [place].  [name] CARDIAC CATHETERIZATION    . CARDIAC CATHETERIZATION  02/05/13   [facility]  . CARDIAC CATHETERIZATION  10/14   [facility] : patent stents with no change in anatomy. EF: 40$  . CARDIAC CATHETERIZATION Left 11/12/2016   Procedure: Left Heart Cath and Coronary Angiography;  Surgeon: Wellington Hampshire, MD;  Location: [facility];  Service: Cardiovascular;  Laterality: Left;  . COLONOSCOPY    . COLONOSCOPY  WITH PROPOFOL N/A 05/18/2016   Procedure: COLONOSCOPY WITH PROPOFOL;  Surgeon: Lucilla Lame, MD;  Location: [facility] END[facility]COPY;  Service: Endoscopy;  Laterality: N/A;  . CORONARY ANGIOPLASTY WITH STENT PLACEMENT  09/2009   LAD 3.0 X23 mm Xience DES, RCA: 4.0 X 15 mm Xience DES  . ELECTROMAGNETIC NAVIGATION BROCHOSCOPY N/A 10/27/2017   Procedure: ELECTROMAGNETIC NAVIGATION BRONCHOSCOPY;  Surgeon: Flora Lipps, MD;  Location: [facility] ORS;  Service: Cardiopulmonary;  Laterality: N/A;  . ESOPHAGEAL DILATION    . ESOPHAGOGASTRODUODENOSCOPY  2008  . ESOPHAGOGASTRODUODENOSCOPY  08/20/2012  . HERNIA REPAIR  07/20/2012   L inguinal hernia repair  . PENILE PROSTHESIS IMPLANT     Allergies  Allergen Reactions  . Prozac [Fluoxetine Hcl] Shortness Of Breath  . Effexor Xr [Venlafaxine Hcl Er]   . Wellbutrin [Bupropion] Other (See Comments)    Unknown/"made me feel real funny"   Current Outpatient Medications on File Prior to Visit  Medication Sig Dispense Refill  . albuterol (VENTOLIN HFA) 108 (90 Base) MCG/ACT inhaler USE 2 INHALATIONS EVERY 6 HOURS AS NEEDED FOR WHEEZING 48 g 3  . aspirin 81 MG tablet Take 81 mg by mouth daily.      [name] atorvastatin (LIPITOR) 40 MG tablet Take 1 tablet (40 mg total) by mouth daily. 90 tablet 3  .  Fluticasone-Salmeterol (ADVAIR DISKUS) 500-50 MCG/DOSE AEPB Inhale 1 puff into the lungs 2 (two) times daily. 180 each 3  . losartan (COZAAR) 25 MG tablet TAKE 1 TABLET DAILY 90 tablet 3  . Multiple Vitamin (MULTIVITAMIN) capsule Take 1 capsule by mouth daily.    . nitroGLYCERIN (NITROSTAT) 0.4 MG SL tablet Place 1 tablet (0.4 mg total) under the tongue every 5 (five) minutes as needed. 25 tablet 3  . tiotropium (SPIRIVA HANDIHALER) 18 MCG inhalation capsule Place 1 capsule (18 mcg total) into inhaler and inhale daily. 90 capsule 3   No current facility-administered medications on file prior to visit.    Social History   Socioeconomic History  . Marital status: Widowed    Spouse  name: Not on file  . Number of children: 5  . Years of education: Not on file  . Highest education level: Not on file  Occupational History  . 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 Financial resource strain: Not on file  . Food insecurity:    Worry: Not on file    Inability: Not on file  . Transportation needs:    Medical: Not on file    Non-medical: Not on file  Tobacco Use  . Smoking status: Current Every Day Smoker    Packs/day: 0.50    Years: 42.00    Pack years: 21.00    Types: Cigarettes  . Smokeless tobacco: Never Used  . Tobacco comment: 02/15/18 For tobacco cessation he is getting better.  He is staying between 6-18 cigarettes a day versus a full pack.  He is also set up to start a one on one smoking cessation class since the current classes were already in progress and did not restart un  Substance and Sexual Activity  . Alcohol use: No    Alcohol/week: 0.0 oz  . Drug use: No  . Sexual activity: Yes  Lifestyle  . Physical activity:    Days per week: Not on file    Minutes per session: Not on file  . Stress: Not on file  Relationships  . Social connections:    Talks on phone: Not on file    Gets together: Not on file    Attends religious service: Not on file    Active member of club or organization: Not on file    Attends meetings of clubs or organizations: Not on file    Relationship status: Not on file  . Intimate partner violence:    Fear of current or ex partner: Not on file    Emotionally abused: Not on file    Physically abused: Not on file    Forced sexual activity: Not on file  Other Topics Concern  . Not on file  Social History Narrative   Marital status: married x 32 years; happily married.      Children: 4 children;  1 stepchild and 15 grandchildren; no great grandchildren.      Lives: with wife; 2 dogs, 1 cat.      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      Tobacco: 1 ppd x 45  years      Alcohol: none; previous alcoholism      Drugs: none      Exercise: no formal exercise; physically demanding job; owns horses.      Seatbelts:  100%      Guns: one loaded unsecured gun in the home.  Sunscreen: none   Family History  Problem Relation Age of Onset  . Heart attack Brother        Brother #1  . Diabetes Brother   . Hypertension Brother        #3  . Coronary artery disease Father 56       deceased  . Heart attack Father   . Diabetes Father   . Heart disease Father   . COPD Mother 69       deceased  . Alcohol abuse Sister        polysubstance abuse  . COPD Sister   . Lung cancer Sister   . Alcohol abuse Sister        polysubstance abuse  . Penile cancer Brother   . Diabetes Brother        Objective:    BP 100/68   Pulse (!) 112   Temp 98 F (36.7 C) (Oral)   Resp 16   Ht 5\' 10"  (1.778 m)   Wt 139 lb (63 kg)   SpO2 95%   BMI 19.94 kg/m  Physical Exam  Constitutional: He is oriented to person, place, and time. He appears well-developed and well-nourished. No distress.  HENT:  Head: Normocephalic and atraumatic.  Right Ear: External ear normal.  Left Ear: External ear normal.  Nose: Nose normal.  Mouth/Throat: Oropharynx is clear and moist.  Eyes: Pupils are equal, round, and reactive to light. Conjunctivae and EOM are normal.  Neck: Normal range of motion. Neck supple. Carotid bruit is not present. No thyromegaly present.  Cardiovascular: Normal rate, regular rhythm, normal heart sounds and intact distal pulses. Exam reveals no gallop and no friction rub.  No murmur heard. Pulmonary/Chest: Effort normal and breath sounds normal. He has no wheezes. He has no rales.  Abdominal: Soft. Bowel sounds are normal. He exhibits no distension and no mass. There is no tenderness. There is no rebound and no guarding.  Musculoskeletal:       Right shoulder: Normal.       Left shoulder: Normal.       Cervical back: Normal.  Lymphadenopathy:    He  has no cervical adenopathy.  Neurological: He is alert and oriented to person, place, and time. He has normal reflexes. No cranial nerve deficit. He exhibits normal muscle tone. Coordination normal.  Skin: Skin is warm and dry. No rash noted. He is not diaphoretic.  Psychiatric: His behavior is normal. Judgment and thought content normal. His mood appears anxious. He exhibits a depressed mood.   No results found. Depression screen [facility] 2/9 07/07/2018 05/22/2018 04/19/2018 03/03/2018 01/24/2018  Decreased Interest 2 2 3 3 3   Down, Depressed, Hopeless 2 2 3 3 3   PHQ - 2 Score 4 4 6 6 6   Altered sleeping 2 1 3 3 3   Tired, decreased energy 2 1 3 3 3   Change in appetite 2 1 3 1 3   Feeling bad or failure about yourself  2 1 3 1 1   Trouble concentrating 0 0 1 1 3   Moving slowly or fidgety/restless 0 0 0 1 3  Suicidal thoughts 0 0 0 1 0  PHQ-9 Score 12 8 19 17 22   Difficult doing work/chores - - - - Extremely dIfficult  Some recent data might be hidden   Fall Risk  07/07/2018 05/22/2018 04/19/2018 03/03/2018 01/24/2018  Falls in the past year? No No No No No        Assessment & Plan:   1.  COPD, very severe ([place])   2. Atherosclerosis of native coronary artery of native heart without angina pectoris   3. Chronic systolic heart failure ([place])   4. DEPRESSION/ANXIETY   5. Grief reaction   6. SMOKER     Anxiety and depression: poorly controlled; upcoming appointment with psychiatry and therapy; increase Lorazepam to 1mg  twice daily to help with morning anxiety.  Start Fluoxetine 20mg  every other day; stop morning Sertraline; continue to take Sertraline 100mg  at bedtime; when increase Fluoxetine to 20mg  once daily, decrease Sertraline to 50mg  daily.  Restart Mirtazipine 15mg  daily.   COPD stable at this time; much improved; anxiety is affecting breathing status and causing patient to increase smoking.    CAD and CHF: stable; asymptomatic.   No orders of the defined types were placed in this  encounter.  No orders of the defined types were placed in this encounter.   Return in about 1 month (around 05/17/2018) for recheck.   Leasa Kincannon Elayne Guerin, M.D. Primary Care at [facility] previously Ur[facility] [place] [address] [place], [place]  [address] [phone] phone [phone] fax

## 2018-04-24 ENCOUNTER — Ambulatory Visit: Payer: BLUE CROSS/BLUE SHIELD | Admitting: Psychology

## 2018-04-24 DIAGNOSIS — J449 Chronic obstructive pulmonary disease, unspecified: Secondary | ICD-10-CM

## 2018-04-24 NOTE — Progress Notes (Signed)
Pulmonary Individual Treatment Plan  Patient Details  Name: Leslie Sims MRN: [id number] Date of Birth: [birth year] Referring Provider:     Pulmonary Rehab from 01/24/2018 in [facility] Cardiac and Pulmonary Rehab  Referring Provider  [name]      Initial Encounter Date:    Pulmonary Rehab from 01/24/2018 in [facility] Cardiac and Pulmonary Rehab  Date  01/24/18  Referring Provider  [name]      Visit Diagnosis: Chronic obstructive pulmonary disease, unspecified COPD type ([place])  Patient's Home Medications on Admission:  Current Outpatient Medications:  .  albuterol (VENTOLIN HFA) 108 (90 Base) MCG/ACT inhaler, USE 2 INHALATIONS EVERY 6 HOURS AS NEEDED FOR WHEEZING, Disp: 48 g, Rfl: 3 .  aspirin 81 MG tablet, Take 81 mg by mouth daily.  , Disp: , Rfl:  .  atorvastatin (LIPITOR) 40 MG tablet, Take 1 tablet (40 mg total) by mouth daily., Disp: 90 tablet, Rfl: 3 .  FLUoxetine (PROZAC) 20 MG tablet, Take 1 tablet (20 mg total) by mouth daily., Disp: 30 tablet, Rfl: 3 .  Fluticasone-Salmeterol (ADVAIR DISKUS) 500-50 MCG/DOSE AEPB, Inhale 1 puff into the lungs 2 (two) times daily., Disp: 180 each, Rfl: 3 .  furosemide (LASIX) 20 MG tablet, Take 1 tablet (20 mg total) by mouth daily., Disp: 30 tablet, Rfl: 0 .  ipratropium-albuterol (DUONEB) 0.5-2.5 (3) MG/3ML SOLN, Take 3 mLs by nebulization every 6 (six) hours as needed., Disp: 225 mL, Rfl: 3 .  ketoconazole (NIZORAL) 2 % cream, Apply 1 application topically 2 (two) times daily., Disp: 60 g, Rfl: 0 .  LORazepam (ATIVAN) 1 MG tablet, TAKE 1/2 TABLET BY MOUTH EVERY 6 HOURS AS NEEDED FOR ANXIETY, Disp: 60 tablet, Rfl: 2 .  losartan (COZAAR) 25 MG tablet, TAKE 1 TABLET DAILY, Disp: 90 tablet, Rfl: 3 .  mirtazapine (REMERON) 15 MG tablet, Take 1 tablet (15 mg total) by mouth at bedtime., Disp: 30 tablet, Rfl: 5 .  Multiple Vitamin (MULTIVITAMIN) capsule, Take 1 capsule by mouth daily., Disp: , Rfl:  .  nitroGLYCERIN (NITROSTAT) 0.4 MG SL tablet, Place  1 tablet (0.4 mg total) under the tongue every 5 (five) minutes as needed., Disp: 25 tablet, Rfl: 3 .  sucralfate (CARAFATE) 1 GM/10ML suspension, Take 10 mLs (1 g total) by mouth 4 (four) times daily -  with meals and at bedtime., Disp: 420 mL, Rfl: 4 .  tiotropium (SPIRIVA HANDIHALER) 18 MCG inhalation capsule, Place 1 capsule (18 mcg total) into inhaler and inhale daily., Disp: 90 capsule, Rfl: 3  Past Medical History: Past Medical History:  Diagnosis Date  . Allergic rhinitis   . Anxiety   . Atherosclerosis   . Back pain   . Bronchitis    09-14-11  . CARDIOMYOPATHY 03/24/2010   did not tolerate Toprol and Lisinopril.   . Chest pain   . CHF (congestive heart failure) ([place])   . Chronic airway obstruction, not elsewhere classified   . Colon polyps 08/2012   colonoscopy; multiple colon polyps; repeat colonoscopy in one year.  [name] COPD (chronic obstructive pulmonary disease) ([place])   . Coronary artery disease 11/2009   Anterior MI with late presentation 11/2009. Cath showed a 100% proximal LAD occlusion, 99% mid RCA. Had a PCI and 2 DES to both LAD and RCA. EF was 35%. Nuclear stress test 05/13: prior anterior/inferior infarcts without ischemic, EF 43%, cardiac cath in 02/14: Widely patent stents with no other obstructive disease. EF 40%   . Depression   . GERD (gastroesophageal reflux disease)   .  Headache(784.0)   . Helicobacter pylori (H. pylori)   . Hemorrhoids   . Hernia    inguinal  . Hyperlipidemia   . Hypertension   . Insomnia   . MI (myocardial infarction) ([place])   . Myalgia   . Peptic ulcer   . Shortness of breath dyspnea   . Status post dilation of esophageal narrowing 2000  . Thyroid dysfunction   . Tinea pedis   . Wears dentures    partial upper    Tobacco Use: Social History   Tobacco Use  Smoking Status Current Every Day Smoker  . Packs/day: 0.50  . Years: 42.00  . Pack years: 21.00  . Types: Cigarettes  Smokeless Tobacco Never Used  Tobacco Comment    02/15/18 For tobacco cessation he is getting better.  He is staying between 6-18 cigarettes a day versus a full pack.  He is also set up to start a one on one smoking cessation class since the current classes were already in progress and did not restart un    Labs: Recent Review Flowsheet Data    Labs for ITP Cardiac and Pulmonary Rehab Latest Ref Rng & Units 03/01/2017 06/17/2017 07/27/2017 12/23/2017 12/25/2017   Cholestrol 100 - 199 mg/dL 112 - 143 - -   LDLCALC 0 - 99 mg/dL 47 - 67 - -   HDL >39 mg/dL 33(L) - 62 - -   Trlycerides 0 - 149 mg/dL 158(H) - 72 - -   Hemoglobin A1c 4.8 - 5.6 % 5.3 5.8(H) - - -   HCO3 20.0 - 28.0 mmol/L - - - 37.2(H) 41.4(H)   O2SAT % - - - 87.4 81.6       Pulmonary Assessment Scores: Pulmonary Assessment Scores    Row Name 01/24/18 1247         ADL UCSD   ADL Phase  Entry New Entry numbers 01/24/2018     SOB Score total  86     Rest  2     Walk  4     Stairs  5     Bath  3     Dress  4     Shop  5       CAT Score   CAT Score  28       mMRC Score   mMRC Score  1        Pulmonary Function Assessment: Pulmonary Function Assessment - 01/24/18 1246      Pulmonary Function Tests   FVC%  72 % date performed 11/01/17    FEV1%  27 %    FEV1/FVC Ratio  30      Breath   Shortness of Breath  Yes;Limiting activity;Fear of Shortness of Breath       Exercise Target Goals:    Exercise Program Goal: Individual exercise prescription set using results from initial 6 min walk test and THRR while considering  patient's activity barriers and safety.    Exercise Prescription Goal: Initial exercise prescription builds to 30-45 minutes a day of aerobic activity, 2-3 days per week.  Home exercise guidelines will be given to patient during program as part of exercise prescription that the participant will acknowledge.  Activity Barriers & Risk Stratification:   6 Minute Walk: 6 Minute Walk    Row Name 01/24/18 1258         6 Minute Walk   Distance   1000 feet     Walk Time  4.75 minutes     #  of Rest Breaks  1     MPH  2.4     METS  3.4     RPE  11     Perceived Dyspnea   2     VO2 Peak  11.9     Symptoms  No     Resting HR  93 bpm     Resting BP  106/72     Resting Oxygen Saturation   95 %     Exercise Oxygen Saturation  during 6 min walk  80 %     Max Ex. HR  123 bpm     Max Ex. BP  132/64     2 Minute Post BP  108/64       Interval HR   1 Minute HR  106     2 Minute HR  104     3 Minute HR  102     4 Minute HR  123     6 Minute HR  120     2 Minute Post HR  97     Interval Heart Rate?  Yes       Interval Oxygen   Interval Oxygen?  Yes     Baseline Oxygen Saturation %  95 %     1 Minute Oxygen Saturation %  82 %     1 Minute Liters of Oxygen  2 L     2 Minute Oxygen Saturation %  82 %     2 Minute Liters of Oxygen  2 L     3 Minute Oxygen Saturation %  81 %     3 Minute Liters of Oxygen  2 L     4 Minute Oxygen Saturation %  83 %     4 Minute Liters of Oxygen  2 L     5 Minute Oxygen Saturation %  81 %     5 Minute Liters of Oxygen  2 L     6 Minute Oxygen Saturation %  80 %     6 Minute Liters of Oxygen  2 L     2 Minute Post Oxygen Saturation %  95 %     2 Minute Post Liters of Oxygen  2 L       Oxygen Initial Assessment: Oxygen Initial Assessment - 01/24/18 1220      Home Oxygen   Home Oxygen Device  Home Concentrator;E-Tanks    Sleep Oxygen Prescription  Continuous    Liters per minute  2    Home Exercise Oxygen Prescription  Continuous    Liters per minute  2 UNtil sees his pulmonary Doctor again.  This is post discharge form January 2019    Home at Rest Exercise Oxygen Prescription  Continuous    Liters per minute  2    Compliance with Home Oxygen Use  Yes      Initial 6 min Walk   Oxygen Used  Continuous;E-Tanks    Liters per minute  2      Program Oxygen Prescription   Program Oxygen Prescription  Continuous;E-Tanks    Liters per minute  2      Intervention   Short Term Goals  To learn  and exhibit compliance with exercise, home and travel O2 prescription;To learn and understand importance of monitoring SPO2 with pulse oximeter and demonstrate accurate use of the pulse oximeter.;To learn and understand importance of maintaining oxygen saturations>88%;To learn and demonstrate proper use of respiratory medications;To learn and  demonstrate proper pursed lip breathing techniques or other breathing techniques.    Long  Term Goals  Exhibits compliance with exercise, home and travel O2 prescription;Verbalizes importance of monitoring SPO2 with pulse oximeter and return demonstration;Maintenance of O2 saturations>88%;Exhibits proper breathing techniques, such as pursed lip breathing or other method taught during program session;Compliance with respiratory medication;Demonstrates proper use of MDI's       Oxygen Re-Evaluation: Oxygen Re-Evaluation    Row Name 02/03/18 1136 03/03/18 1155           Program Oxygen Prescription   Program Oxygen Prescription  Continuous;E-Tanks  Continuous;E-Tanks      Liters per minute  2  2        Home Oxygen   Home Oxygen Device  Home Concentrator;E-Tanks  Home Concentrator;E-Tanks      Sleep Oxygen Prescription  Continuous  Continuous      Liters per minute  2  2      Home Exercise Oxygen Prescription  Continuous  Continuous      Liters per minute  2  2      Home at Rest Exercise Oxygen Prescription  Continuous  Continuous      Liters per minute  2  2      Compliance with Home Oxygen Use  Yes  Yes        Goals/Expected Outcomes   Short Term Goals  To learn and exhibit compliance with exercise, home and travel O2 prescription;To learn and understand importance of monitoring SPO2 with pulse oximeter and demonstrate accurate use of the pulse oximeter.;To learn and understand importance of maintaining oxygen saturations>88%;To learn and demonstrate proper use of respiratory medications;To learn and demonstrate proper pursed lip breathing techniques  or other breathing techniques.  To learn and exhibit compliance with exercise, home and travel O2 prescription;To learn and understand importance of monitoring SPO2 with pulse oximeter and demonstrate accurate use of the pulse oximeter.;To learn and understand importance of maintaining oxygen saturations>88%;To learn and demonstrate proper use of respiratory medications;To learn and demonstrate proper pursed lip breathing techniques or other breathing techniques.      Long  Term Goals  Exhibits compliance with exercise, home and travel O2 prescription;Verbalizes importance of monitoring SPO2 with pulse oximeter and return demonstration;Maintenance of O2 saturations>88%;Exhibits proper breathing techniques, such as pursed lip breathing or other method taught during program session;Compliance with respiratory medication;Demonstrates proper use of MDI's  Exhibits compliance with exercise, home and travel O2 prescription;Verbalizes importance of monitoring SPO2 with pulse oximeter and return demonstration;Maintenance of O2 saturations>88%;Exhibits proper breathing techniques, such as pursed lip breathing or other method taught during program session;Compliance with respiratory medication;Demonstrates proper use of MDI's      Comments  Reviewed PLB technique with pt.  Talked about how it work and it's important to maintaining his exercise saturations.    Matthew has a pulse oximeter at home and understands that while exercising his oxygen should be above 88 percent. He is taking his respiratory medications regularly. He sates that he is more short of breath in the morning when he wakes up. He wears 2 liters at night when he sleeps. Sometimes when he uses humidity he wakes up with a stuffy nose and when he does not use it he wakes up to dry. Informed him to check his concentrator and make sure the filter is clean.      Goals/Expected Outcomes  Short: Become more profiecient at using PLB.   Long: Become independent  at using PLB.  Short:  check his home cencentrator filter. Long: maintain proper use of concentrator         Oxygen Discharge (Final Oxygen Re-Evaluation): Oxygen Re-Evaluation - 03/03/18 1155      Program Oxygen Prescription   Program Oxygen Prescription  Continuous;E-Tanks    Liters per minute  2      Home Oxygen   Home Oxygen Device  Home Concentrator;E-Tanks    Sleep Oxygen Prescription  Continuous    Liters per minute  2    Home Exercise Oxygen Prescription  Continuous    Liters per minute  2    Home at Rest Exercise Oxygen Prescription  Continuous    Liters per minute  2    Compliance with Home Oxygen Use  Yes      Goals/Expected Outcomes   Short Term Goals  To learn and exhibit compliance with exercise, home and travel O2 prescription;To learn and understand importance of monitoring SPO2 with pulse oximeter and demonstrate accurate use of the pulse oximeter.;To learn and understand importance of maintaining oxygen saturations>88%;To learn and demonstrate proper use of respiratory medications;To learn and demonstrate proper pursed lip breathing techniques or other breathing techniques.    Long  Term Goals  Exhibits compliance with exercise, home and travel O2 prescription;Verbalizes importance of monitoring SPO2 with pulse oximeter and return demonstration;Maintenance of O2 saturations>88%;Exhibits proper breathing techniques, such as pursed lip breathing or other method taught during program session;Compliance with respiratory medication;Demonstrates proper use of MDI's    Comments  Avyon has a pulse oximeter at home and understands that while exercising his oxygen should be above 88 percent. He is taking his respiratory medications regularly. He sates that he is more short of breath in the morning when he wakes up. He wears 2 liters at night when he sleeps. Sometimes when he uses humidity he wakes up with a stuffy nose and when he does not use it he wakes up to dry. Informed him to  check his concentrator and make sure the filter is clean.    Goals/Expected Outcomes  Short: check his home cencentrator filter. Long: maintain proper use of concentrator       Initial Exercise Prescription: Initial Exercise Prescription - 01/24/18 1300      Date of Initial Exercise RX and Referring Provider   Date  01/24/18    Referring Provider  [name]      Oxygen   Oxygen  Continuous    Liters  2      Treadmill   MPH  2.4    Grade  1.4    Minutes  15    METs  3.3      Recumbant Bike   Level  1    RPM  60    Watts  25    Minutes  15    METs  3.3      T5 Nustep   Level  2    SPM  80    Minutes  15    METs  3.3      Prescription Details   Frequency (times per week)  3    Duration  Progress to 45 minutes of aerobic exercise without signs/symptoms of physical distress      Intensity   THRR 40-80% of Max Heartrate  118-143    Ratings of Perceived Exertion  11-13    Perceived Dyspnea  0-4      Resistance Training   Training Prescription  Yes    Weight  3 lb  Reps  10-15       Perform Capillary Blood Glucose checks as needed.  Exercise Prescription Changes: Exercise Prescription Changes    Row Name 02/14/18 1000 02/15/18 1500 02/28/18 1400 03/15/18 1400 03/27/18 1600     Response to Exercise   Blood Pressure (Admit)  142/80  -  124/70  108/58  122/70   Blood Pressure (Exercise)  126/74  -  -  -  -   Blood Pressure (Exit)  100/58  -  106/58  124/60  110/60   Heart Rate (Admit)  115 bpm  -  98 bpm  92 bpm  98 bpm   Heart Rate (Exercise)  130 bpm  -  121 bpm  102 bpm  109 bpm   Heart Rate (Exit)  101 bpm  -  102 bpm  96 bpm  92 bpm   Oxygen Saturation (Admit)  99 %  -  96 %  100 %  98 %   Oxygen Saturation (Exercise)  89 %  -  94 %  94 %  94 %   Oxygen Saturation (Exit)  98 %  -  95 %  95 %  94 %   Rating of Perceived Exertion (Exercise)  12  -  12  10  10    Perceived Dyspnea (Exercise)  2  -  1  1  2    Symptoms  none  -  none  none  none   Duration   Continue with 45 min of aerobic exercise without signs/symptoms of physical distress.  -  Continue with 45 min of aerobic exercise without signs/symptoms of physical distress.  Continue with 45 min of aerobic exercise without signs/symptoms of physical distress.  Continue with 45 min of aerobic exercise without signs/symptoms of physical distress.   Intensity  THRR unchanged  -  THRR unchanged  THRR unchanged  THRR unchanged     Progression   Progression  Continue to progress workloads to maintain intensity without signs/symptoms of physical distress.  -  Continue to progress workloads to maintain intensity without signs/symptoms of physical distress.  Continue to progress workloads to maintain intensity without signs/symptoms of physical distress.  Continue to progress workloads to maintain intensity without signs/symptoms of physical distress.   Average METs  2.58  -  2.82  3.04  2.9     Resistance Training   Training Prescription  Yes  -  Yes  Yes  Yes   Weight  4 lbs  -  5 lbs  5 lbs  5 lbs   Reps  10-15  -  10-15  10-15  10-15     Interval Training   Interval Training  No  -  No  No  No     Oxygen   Oxygen  Continuous  -  Continuous  Continuous  Continuous   Liters  2  -  2 3L as needed   2  2     Treadmill   MPH  2.4  -  2.5  2.5  2.5   Grade  1  -  0  0.5  0   Minutes  15  -  15  15  15    METs  3.17  -  2.91  3.09  2.91     Recumbant Bike   Level  2  -  4  4  5    RPM  70  -  64  85  59   Watts  22  -  29  32  34   Minutes  15  -  15  15  15    METs  3.21  -  3.84  3.84  3.84     T5 Nustep   Level  3  -  4  5  4    SPM  86  -  82  81  89   Minutes  15  -  15  15  15    METs  2  -  2.1  2.2  2.1     Home Exercise Plan   Plans to continue exercise at  -  Home (comment) walking, ellipitical  Home (comment) walking, ellipitical  Home (comment) walking, ellipitical  Home (comment) walking, ellipitical   Frequency  -  Add 2 additional days to program exercise sessions.  Add 2  additional days to program exercise sessions.  Add 2 additional days to program exercise sessions.  Add 2 additional days to program exercise sessions.   Initial Home Exercises Provided  -  02/15/18  02/15/18  02/15/18  02/15/18      Exercise Comments: Exercise Comments    Row Name 02/03/18 1135           Exercise Comments  First full day of exercise!  Patient was oriented to gym and equipment including functions, settings, policies, and procedures.  Patient's individual exercise prescription and treatment plan were reviewed.  All starting workloads were established based on the results of the 6 minute walk test done at initial orientation visit.  The plan for exercise progression was also introduced and progression will be customized based on patient's performance and goals.          Exercise Goals and Review: Exercise Goals    Row Name 01/24/18 1257             Exercise Goals   Increase Physical Activity  Yes       Intervention  Provide advice, education, support and counseling about physical activity/exercise needs.;Develop an individualized exercise prescription for aerobic and resistive training based on initial evaluation findings, risk stratification, comorbidities and participant's personal goals.       Expected Outcomes  Short Term: Attend rehab on a regular basis to increase amount of physical activity.;Long Term: Add in home exercise to make exercise part of routine and to increase amount of physical activity.;Long Term: Exercising regularly at least 3-5 days a week.       Increase Strength and Stamina  Yes       Intervention  Provide advice, education, support and counseling about physical activity/exercise needs.;Develop an individualized exercise prescription for aerobic and resistive training based on initial evaluation findings, risk stratification, comorbidities and participant's personal goals.       Expected Outcomes  Short Term: Increase workloads from initial  exercise prescription for resistance, speed, and METs.;Short Term: Perform resistance training exercises routinely during rehab and add in resistance training at home;Long Term: Improve cardiorespiratory fitness, muscular endurance and strength as measured by increased METs and functional capacity (6MWT)       Able to understand and use rate of perceived exertion (RPE) scale  Yes       Intervention  Provide education and explanation on how to use RPE scale       Expected Outcomes  Short Term: Able to use RPE daily in rehab to express subjective intensity level;Long Term:  Able to use RPE to guide intensity level when exercising independently       Able  to understand and use Dyspnea scale  Yes       Intervention  Provide education and explanation on how to use Dyspnea scale       Expected Outcomes  Short Term: Able to use Dyspnea scale daily in rehab to express subjective sense of shortness of breath during exertion;Long Term: Able to use Dyspnea scale to guide intensity level when exercising independently       Knowledge and understanding of Target Heart Rate Range (THRR)  Yes       Intervention  Provide education and explanation of THRR including how the numbers were predicted and where they are located for reference       Expected Outcomes  Short Term: Able to state/look up THRR;Long Term: Able to use THRR to govern intensity when exercising independently;Short Term: Able to use daily as guideline for intensity in rehab       Able to check pulse independently  Yes       Intervention  Provide education and demonstration on how to check pulse in carotid and radial arteries.;Review the importance of being able to check your own pulse for safety during independent exercise       Expected Outcomes  Short Term: Able to explain why pulse checking is important during independent exercise;Long Term: Able to check pulse independently and accurately       Understanding of Exercise Prescription  Yes        Intervention  Provide education, explanation, and written materials on patient's individual exercise prescription       Expected Outcomes  Short Term: Able to explain program exercise prescription;Long Term: Able to explain home exercise prescription to exercise independently          Exercise Goals Re-Evaluation : Exercise Goals Re-Evaluation    Row Name 02/03/18 1135 02/14/18 1034 02/15/18 1513 02/28/18 1456 03/15/18 1447     Exercise Goal Re-Evaluation   Exercise Goals Review  Understanding of Exercise Prescription;Knowledge and understanding of Target Heart Rate Range (THRR);Able to understand and use rate of perceived exertion (RPE) scale;Able to understand and use Dyspnea scale  Increase Physical Activity;Understanding of Exercise Prescription;Increase Strength and Stamina  Increase Physical Activity;Understanding of Exercise Prescription;Increase Strength and Stamina;Able to understand and use Dyspnea scale;Knowledge and understanding of Target Heart Rate Range (THRR);Able to understand and use rate of perceived exertion (RPE) scale;Able to check pulse independently  Increase Physical Activity;Understanding of Exercise Prescription;Increase Strength and Stamina  Increase Physical Activity;Understanding of Exercise Prescription;Increase Strength and Stamina   Comments  Reviewed RPE scale, THR and program prescription with pt today.  Pt voiced understanding and was given a copy of goals to take home.   Jaquavious is off to a good start in rehab.  He is already up to level 2 on the recumbent bike and level 3 on the T5 NuStep.  We will continue to monitor his progression.   Reviewed home exercise with pt today.  Pt plans to walk and eventually use elliptical at home for exercise.  Reviewed THR, pulse, RPE, sign and symptoms, NTG use, and when to call 911 or MD.  Also discussed weather considerations and indoor options.  Pt voiced understanding.  Jamorian has been doing well in rehab.  He is now up to level  4 on the T5 NuStep and the recumbent bike.  He seems to be enjoying the exercise more this go around.  We will continue to monitor his progression.   Mekai continues to do well in rehab. He  is now using 5 lbs handweights and moved up to level 5 on the T5 NuStep. He has also added 0.5% grade to his treadmill. We will continue to monitor his progression.   Expected Outcomes  Short: Use RPE daily to regulate intensity.  Long: Follow program prescription in THR.  Short: Continue to attend classes regularly.  Long: Continue to work on Animator.   Short: Add in at least one extra day of walking at home.  Long: Continue to build strength and stamina.   Short: Continue to increase walking here and at home.  Long: Continue to exercise on off days.   Short: Add more incline to treadmill.  Long: Continue to build strength and stamina.    [place] Name 03/27/18 1604 04/12/18 1456           Exercise Goal Re-Evaluation   Exercise Goals Review  Increase Physical Activity;Understanding of Exercise Prescription;Increase Strength and Stamina  -      Comments  Ikechukwu has been doing well in rehab.  He is now up to 2.5 mph on the treadmill.  We will continue to moniotr his progression.   Out since last review      Expected Outcomes  Short: Add incline back to treadmill.  Long: Continue to work on exercising at home.   -         Discharge Exercise Prescription (Final Exercise Prescription Changes): Exercise Prescription Changes - 03/27/18 1600      Response to Exercise   Blood Pressure (Admit)  122/70    Blood Pressure (Exit)  110/60    Heart Rate (Admit)  98 bpm    Heart Rate (Exercise)  109 bpm    Heart Rate (Exit)  92 bpm    Oxygen Saturation (Admit)  98 %    Oxygen Saturation (Exercise)  94 %    Oxygen Saturation (Exit)  94 %    Rating of Perceived Exertion (Exercise)  10    Perceived Dyspnea (Exercise)  2    Symptoms  none    Duration  Continue with 45 min of aerobic exercise without  signs/symptoms of physical distress.    Intensity  THRR unchanged      Progression   Progression  Continue to progress workloads to maintain intensity without signs/symptoms of physical distress.    Average METs  2.9      Resistance Training   Training Prescription  Yes    Weight  5 lbs    Reps  10-15      Interval Training   Interval Training  No      Oxygen   Oxygen  Continuous    Liters  2      Treadmill   MPH  2.5    Grade  0    Minutes  15    METs  2.91      Recumbant Bike   Level  5    RPM  59    Watts  34    Minutes  15    METs  3.84      T5 Nustep   Level  4    SPM  89    Minutes  15    METs  2.1      Home Exercise Plan   Plans to continue exercise at  Home (comment) walking, ellipitical    Frequency  Add 2 additional days to program exercise sessions.    Initial Home Exercises Provided  02/15/18  Nutrition:  Target Goals: Understanding of nutrition guidelines, daily intake of sodium <1581m, cholesterol <2020m calories 30% from fat and 7% or less from saturated fats, daily to have 5 or more servings of fruits and vegetables.  Biometrics: Pre Biometrics - 01/24/18 1257      Pre Biometrics   Height  5' 9.5" (1.765 m)    Weight  141 lb 12.8 oz (64.3 kg)    Waist Circumference  35 inches    Hip Circumference  37.25 inches    Waist to Hip Ratio  0.94 %    BMI (Calculated)  20.65        Nutrition Therapy Plan and Nutrition Goals: Nutrition Therapy & Goals - 03/15/18 1227      Nutrition Therapy   Diet  TLC diabetic but does not follow diabetic diet, follows a low sodium diet    Drug/Food Interactions  Statins/Certain Fruits    Protein (specify units)  12-13oz    Fiber  30 grams    Whole Grain Foods  3 servings    Saturated Fats  16 max. grams    Fruits and Vegetables  2 servings/day does not regularly consume them; 8 servings ideal     Sodium  1500 grams      Personal Nutrition Goals   Nutrition Goal  Increase intake of fruits and  vegetables to at least 1 serving per day    Personal Goal #2  Do not skip meals! Eat on a regular schedule and consume snacks between meals when possible. Focus on protein-rich meals and snacks    Personal Goal #3  Consume Premier Protein shakes more consistently, at least 1/day ideally    Comments  patient reports recent wt loss of 10# and is struggling to maintain weight. consumes seafood, pork chops, beans, eggs, dairy and peanut butter for protein      Intervention Plan   Intervention  Prescribe, educate and counsel regarding individualized specific dietary modifications aiming towards targeted core components such as weight, hypertension, lipid management, diabetes, heart failure and other comorbidities.;Nutrition handout(s) given to patient. Nutition therapy for COPD handout provided    Expected Outcomes  Short Term Goal: Understand basic principles of dietary content, such as calories, fat, sodium, cholesterol and nutrients.;Short Term Goal: A plan has been developed with personal nutrition goals set during dietitian appointment.;Long Term Goal: Adherence to prescribed nutrition plan.       Nutrition Assessments: Nutrition Assessments - 01/24/18 1224      MEDFICTS Scores   Pre Score  38       Nutrition Goals Re-Evaluation: Nutrition Goals Re-Evaluation    Row Name 02/15/18 1525 03/03/18 1205 03/15/18 1231 03/15/18 1232       Goals   Current Weight  139 lb (63 kg)  144 lb (65.3 kg)  -  -    Nutrition Goal  Meet with dietician to gain weight  Meet with dietician to gain weight, eat healthier  Increase intake of fruits and vegetables, ideally working up to consuming each food group at least daily  Do not skip meals; eat on a regular schedule and utilize PrAutoZonerotein shakes as needed for extra protein and calories    Comment  Aqeel would like to gain weight.  He wants to learn to eat right but still gain some weight.  He has an appointment scheduled to meet with dietician next  week.  Lemuel meets with the dietician next week. He wants to gain a little bit of weight. Informed  him that his diet is important to his breathing. Patient verbalizes understanding.  He reports intake of fruits and vegetables to be low at this time  Patient reports difficulty maintaining weight, recent wt loss of 10# per his report, decreased appetite    Expected Outcome  Short: Meet with dietician.  Long: Continue to follow recommendations.   Short: Meet with dietician.  Long: Continue to follow recommendations of dietician.   Patient will consume both fruits and vegetables daily  Patient will maintain CBW or gain weight        Nutrition Goals Discharge (Final Nutrition Goals Re-Evaluation): Nutrition Goals Re-Evaluation - 03/15/18 1232      Goals   Nutrition Goal  Do not skip meals; eat on a regular schedule and utilize Premier Protein shakes as needed for extra protein and calories    Comment  Patient reports difficulty maintaining weight, recent wt loss of 10# per his report, decreased appetite    Expected Outcome  Patient will maintain CBW or gain weight        Psychosocial: Target Goals: Acknowledge presence or absence of significant depression and/or stress, maximize coping skills, provide positive support system. Participant is able to verbalize types and ability to use techniques and skills needed for reducing stress and depression.   Initial Review & Psychosocial Screening: Initial Psych Review & Screening - 01/24/18 1251      Initial Review   Comments  Orval states that he tries to get out of the house, away from home,at least 30 min every day.   He does have  a CAT Scan due soon for followup of nodules found earlier in his chest.  Biopsy did indicate not cancer.       Family Dynamics   Good Support System?  -- Oneil has his 2 dogs at home that are great companions to him       Quality of Life Scores:  Scores of 19 and below usually indicate a poorer quality of life in  these areas.  A difference of  2-3 points is a clinically meaningful difference.  A difference of 2-3 points in the total score of the Quality of Life Index has been associated with significant improvement in overall quality of life, self-image, physical symptoms, and general health in studies assessing change in quality of life.  PHQ-9: Recent Review Flowsheet Data    Depression screen [facility] 2/9 04/19/2018 03/03/2018 01/24/2018 01/09/2018 11/16/2017   Decreased Interest 3 3 3 3 2    Down, Depressed, Hopeless 3 3 3 3 2    PHQ - 2 Score 6 6 6 6 4    Altered sleeping 3 3 3 3 1    Tired, decreased energy 3 3 3 3 1    Change in appetite 3 1 3 3 2    Feeling bad or failure about yourself  3 1 1 2 1    Trouble concentrating 1 1 3 2  0   Moving slowly or fidgety/restless 0 1 3 0 0   Suicidal thoughts 0 1 0 0 0   PHQ-9 Score 19 17 22 19 9    Difficult doing work/chores - - Extremely dIfficult - -     Interpretation of Total Score  Total Score Depression Severity:  1-4 = Minimal depression, 5-9 = Mild depression, 10-14 = Moderate depression, 15-19 = Moderately severe depression, 20-27 = Severe depression   Psychosocial Evaluation and Intervention: Psychosocial Evaluation - 02/08/18 1219      Psychosocial Evaluation & Interventions   Comments  Mr. Mayeda (  Kamari) has returned to this Pulmonary Rehab program following a hospitalization recently for an upper lung infection.  He is a 67 year old who suffers with multiple health issues including COPD.  Devesh has a strong support system with his daughter close by and his local church family.  He sadly lost his primary support spouse this past July and is grieving that loss currently.  Jad reports chronic sleeping problems and has lost his appetite recently.  He has a history of depression and anxiety and reports these have both worsened with the loss of his spouse, brother and sister in the past several years.  He is on medication to help with this and reports it  is working "somewhat."  Abyan is attending Grief share support groups with his daughter and states this is helping as well.  Kizer's PHQ-9 is "22" indicating severe depression currently.  Counselor discussed this with him and encouraged a medication evaluation if this doesn't improve significantly in the next few weeks.  Mecca has goals to breathe better; be off the oxygen during the day and improve his health and be able to return to more normal activities.  He will be followed by staff and this counselor throughout the course of this program.      Expected Outcomes  Jeriko will begin to exercise more consistently to help with his goals to breathe better and also to help with his depression/anxiety symptoms.  The educational and psychoeducational components of this program will help him understand and cope more positively with his condition. Lonney will have a medication evaluation to address his mood symptoms in the near future.      Continue Psychosocial Services   Follow up required by counselor       Psychosocial Re-Evaluation: Psychosocial Re-Evaluation    [place] Name 03/13/18 1224             Psychosocial Re-Evaluation   Current issues with  Current Sleep Concerns;Current Psychotropic Meds;Current Anxiety/Panic;Current Depression       Comments  Counselor follow up with Cortney today.  He was tearful and reported "not sure what's going on."  He denies HI/SI at this time.  He states his anxiety has increased as he is afraid of going too far from home; afraid of increased shortness of breath; and is afraid of not being able to breathe when he takes a shower - opens window and shower curtain.  His medications for anxiety and depression have recently been changed and he continues to struggle.  Denney is attending grief-share for his complicated grief subsequent to the loss of his spouse 9 months ago.  He reports not sleeping well and no interest in things he used to enjoy.  Lynwood is  meeting with a therapist tomorrow for his initial intake appointment.  He is being followed on his medications by his PCP.  This counselor suggested a psychiatrist since he has had so many negative reactions to psychotropic medications.  Counselor provided a name and number for a local psychiatrist.  Katie states he feels better once he works out.  He also thinks part of the problem yesterday was he forgot his nicotene patch in the morning.  Counselor will continue to provide support and Filbert will meet with his counselor; call his daughter for support; and contact a psychiatrist for medication evaluation.         Expected Outcomes  [place] will contact a psychiatrist; meet with a new therapist tomorrow; and call his Daughter for  support during this time.  He also will continue to attend the Grief support group.  LT - to continue to work on smoking cessation and exercise for improved breathing and health.       Interventions  Relaxation education;Physician referral;Encouraged to attend Pulmonary Rehabilitation for the exercise       Continue Psychosocial Services   Follow up required by counselor          Psychosocial Discharge (Final Psychosocial Re-Evaluation): Psychosocial Re-Evaluation - 03/13/18 1224      Psychosocial Re-Evaluation   Current issues with  Current Sleep Concerns;Current Psychotropic Meds;Current Anxiety/Panic;Current Depression    Comments  Counselor follow up with Dillwyn today.  He was tearful and reported "not sure what's going on."  He denies HI/SI at this time.  He states his anxiety has increased as he is afraid of going too far from home; afraid of increased shortness of breath; and is afraid of not being able to breathe when he takes a shower - opens window and shower curtain.  His medications for anxiety and depression have recently been changed and he continues to struggle.  Beau is attending grief-share for his complicated grief subsequent to the loss of his  spouse 9 months ago.  He reports not sleeping well and no interest in things he used to enjoy.  Arsenio is meeting with a therapist tomorrow for his initial intake appointment.  He is being followed on his medications by his PCP.  This counselor suggested a psychiatrist since he has had so many negative reactions to psychotropic medications.  Counselor provided a name and number for a local psychiatrist.  Gregery states he feels better once he works out.  He also thinks part of the problem yesterday was he forgot his nicotene patch in the morning.  Counselor will continue to provide support and Kam will meet with his counselor; call his daughter for support; and contact a psychiatrist for medication evaluation.      Expected Outcomes  Fate will contact a psychiatrist; meet with a new therapist tomorrow; and call his Daughter for support during this time.  He also will continue to attend the Grief support group.  LT - to continue to work on smoking cessation and exercise for improved breathing and health.    Interventions  Relaxation education;Physician referral;Encouraged to attend Pulmonary Rehabilitation for the exercise    Continue Psychosocial Services   Follow up required by counselor       Education: Education Goals: Education classes will be provided on a weekly basis, covering required topics. Participant will state understanding/return demonstration of topics presented.  Learning Barriers/Preferences: Learning Barriers/Preferences - 01/24/18 1237      Learning Barriers/Preferences   Learning Barriers  None    Learning Preferences  Verbal Instruction;Video       Education Topics:  Initial Evaluation Education: - Verbal, written and demonstration of respiratory meds, oximetry and breathing techniques. Instruction on use of nebulizers and MDIs and importance of monitoring MDI activations.   Pulmonary Rehab from 03/31/2018 in [facility] Cardiac and Pulmonary Rehab  Date  01/24/18   Educator  SB  Instruction Review Code  1- Verbalizes Understanding      General Nutrition Guidelines/Fats and Fiber: -Group instruction provided by verbal, written material, models and posters to present the general guidelines for heart healthy nutrition. Gives an explanation and review of dietary fats and fiber.   Pulmonary Rehab from 03/31/2018 in [facility] Cardiac and Pulmonary Rehab  Date  03/13/18  Educator  CR  Instruction Review Code  1- Verbalizes Understanding      Controlling Sodium/Reading Food Labels: -Group verbal and written material supporting the discussion of sodium use in heart healthy nutrition. Review and explanation with models, verbal and written materials for utilization of the food label.   Pulmonary Rehab from 03/31/2018 in [facility] Cardiac and Pulmonary Rehab  Date  03/20/18  Educator  CR  Instruction Review Code  1- Verbalizes Understanding      Exercise Physiology & General Exercise Guidelines: - Group verbal and written instruction with models to review the exercise physiology of the cardiovascular system and associated critical values. Provides general exercise guidelines with specific guidelines to those with heart or lung disease.    Aerobic Exercise & Resistance Training: - Gives group verbal and written instruction on the various components of exercise. Focuses on aerobic and resistive training programs and the benefits of this training and how to safely progress through these programs.   Flexibility, Balance, Mind/Body Relaxation: Provides group verbal/written instruction on the benefits of flexibility and balance training, including mind/body exercise modes such as yoga, pilates and tai chi.  Demonstration and skill practice provided.   Stress and Anxiety: - Provides group verbal and written instruction about the health risks of elevated stress and causes of high stress.  Discuss the correlation between heart/lung disease and anxiety and treatment  options. Review healthy ways to manage with stress and anxiety.   Pulmonary Rehab from 03/31/2018 in [facility] Cardiac and Pulmonary Rehab  Date  02/22/18  Educator  [employer]  Instruction Review Code  1- Verbalizes Understanding      Depression: - Provides group verbal and written instruction on the correlation between heart/lung disease and depressed mood, treatment options, and the stigmas associated with seeking treatment.   Pulmonary Rehab from 03/31/2018 in [facility] Cardiac and Pulmonary Rehab  Date  02/08/18  Educator  [facility]  Instruction Review Code  1- Verbalizes Understanding      Exercise & Equipment Safety: - Individual verbal instruction and demonstration of equipment use and safety with use of the equipment.   Pulmonary Rehab from 03/31/2018 in [facility] Cardiac and Pulmonary Rehab  Date  01/24/18  Educator  Sb  Instruction Review Code  1- Verbalizes Understanding      Infection Prevention: - Provides verbal and written material to individual with discussion of infection control including proper hand washing and proper equipment cleaning during exercise session.   Pulmonary Rehab from 03/31/2018 in [facility] Cardiac and Pulmonary Rehab  Date  01/24/18  Educator  Sb  Instruction Review Code  1- Verbalizes Understanding      Falls Prevention: - Provides verbal and written material to individual with discussion of falls prevention and safety.   Pulmonary Rehab from 03/31/2018 in [facility] Cardiac and Pulmonary Rehab  Date  01/24/18  Educator  SB  Instruction Review Code  1- Verbalizes Understanding      Diabetes: - Individual verbal and written instruction to review signs/symptoms of diabetes, desired ranges of glucose level fasting, after meals and with exercise. Advice that pre and post exercise glucose checks will be done for 3 sessions at entry of program.   Chronic Lung Diseases: - Group verbal and written instruction to review updates, respiratory medications, advancements in procedures and  treatments. Discuss use of supplemental oxygen including available portable oxygen systems, continuous and intermittent flow rates, concentrators, personal use and safety guidelines. Review proper use of inhaler and spacers. Provide informative websites for self-education.  Pulmonary Rehab from 03/31/2018 in [facility] Cardiac and Pulmonary Rehab  Date  03/15/18  Educator  [facility]  Instruction Review Code  1- Verbalizes Understanding      Energy Conservation: - Provide group verbal and written instruction for methods to conserve energy, plan and organize activities. Instruct on pacing techniques, use of adaptive equipment and posture/positioning to relieve shortness of breath.   Pulmonary Rehab from 03/31/2018 in [facility] Cardiac and Pulmonary Rehab  Date  02/15/18  Educator  [facility]  Instruction Review Code  1- Verbalizes Understanding      Triggers and Exacerbations: - Group verbal and written instruction to review types of environmental triggers and ways to prevent exacerbations. Discuss weather changes, air quality and the benefits of nasal washing. Review warning signs and symptoms to help prevent infections. Discuss techniques for effective airway clearance, coughing, and vibrations.   AED/CPR: - Group verbal and written instruction with the use of models to demonstrate the basic use of the AED with the basic ABC's of resuscitation.   Pulmonary Rehab from 03/31/2018 in [facility] Cardiac and Pulmonary Rehab  Date  02/17/18  Educator  [facility]  Instruction Review Code  1- Actuary and Physiology of the Lungs: - Group verbal and written instruction with the use of models to provide basic lung anatomy and physiology related to function, structure and complications of lung disease.   Pulmonary Rehab from 03/31/2018 in [facility] Cardiac and Pulmonary Rehab  Date  03/01/18  Educator  [facility]  Instruction Review Code  1- Verbalizes Understanding      Anatomy & Physiology of the Heart: - Group  verbal and written instruction and models provide basic cardiac anatomy and physiology, with the coronary electrical and arterial systems. Review of Valvular disease and Heart Failure   Cardiac Medications: - Group verbal and written instruction to review commonly prescribed medications for heart disease. Reviews the medication, class of the drug, and side effects.   Pulmonary Rehab from 03/31/2018 in [facility] Cardiac and Pulmonary Rehab  Date  02/03/18  Educator  [facility]  Instruction Review Code  1- Verbalizes Understanding      Know Your Numbers and Risk Factors: -Group verbal and written instruction about important numbers in your health.  Discussion of what are risk factors and how they play a role in the disease process.  Review of Cholesterol, Blood Pressure, Diabetes, and BMI and the role they play in your overall health.   Pulmonary Rehab from 03/31/2018 in [facility] Cardiac and Pulmonary Rehab  Date  03/31/18  Educator  [facility]  Instruction Review Code  1- Verbalizes Understanding      Sleep Hygiene: -Provides group verbal and written instruction about how sleep can affect your health.  Define sleep hygiene, discuss sleep cycles and impact of sleep habits. Review good sleep hygiene tips.    Other: -Provides group and verbal instruction on various topics (see comments)    Knowledge Questionnaire Score: Knowledge Questionnaire Score - 01/24/18 1237      Knowledge Questionnaire Score   Pre Score  15/18 Reviewed correct responses with Albino. He verbalized understading of the correct response. He had no questions today        Core Components/Risk Factors/Patient Goals at Admission: Personal Goals and Risk Factors at Admission - 01/24/18 1226      Core Components/Risk Factors/Patient Goals on Admission    Weight Management  Yes    Intervention  Weight Management: Develop a combined nutrition and exercise program designed to  reach desired caloric intake, while maintaining appropriate intake  of nutrient and fiber, sodium and fats, and appropriate energy expenditure required for the weight goal.;Weight Management: Provide education and appropriate resources to help participant work on and attain dietary goals.;Weight Management/Obesity: Establish reasonable short term and long term weight goals.    Admit Weight  141 lb 12.8 oz (64.3 kg)    Goal Weight: Short Term  145 lb (65.8 kg)    Goal Weight: Long Term  145 lb (65.8 kg)    Expected Outcomes  Short Term: Continue to assess and modify interventions until short term weight is achieved;Long Term: Adherence to nutrition and physical activity/exercise program aimed toward attainment of established weight goal;Weight Maintenance: Understanding of the daily nutrition guidelines, which includes 25-35% calories from fat, 7% or less cal from saturated fats, less than 234m cholesterol, less than 1.5gm of sodium, & 5 or more servings of fruits and vegetables daily;Understanding recommendations for meals to include 15-35% energy as protein, 25-35% energy from fat, 35-60% energy from carbohydrates, less than 2052mof dietary cholesterol, 20-35 gm of total fiber daily;Understanding of distribution of calorie intake throughout the day with the consumption of 4-5 meals/snacks    Tobacco Cessation  Yes    Intervention  Assist the participant in steps to quit. Provide individualized education and counseling about committing to Tobacco Cessation, relapse prevention, and pharmacological support that can be provided by physician.;OfAdvice workerassist with locating and accessing local/national Quit Smoking programs, and support quit date choice. Given smoking cessation packet. Reviewed need to see the counselor to help him with the smoking cessation journey.    Expected Outcomes  Short Term: Will demonstrate readiness to quit, by selecting a quit date.;Long Term: Complete abstinence from all tobacco products for at least 12 months from quit  date.;Short Term: Will quit all tobacco product use, adhering to prevention of relapse plan.    Improve shortness of breath with ADL's  Yes    Intervention  Provide education, individualized exercise plan and daily activity instruction to help decrease symptoms of SOB with activities of daily living.    Expected Outcomes  Short Term: Improve cardiorespiratory fitness to achieve a reduction of symptoms when performing ADLs;Long Term: Be able to perform more ADLs without symptoms or delay the onset of symptoms    Heart Failure  Yes    Intervention  Provide a combined exercise and nutrition program that is supplemented with education, support and counseling about heart failure. Directed toward relieving symptoms such as shortness of breath, decreased exercise tolerance, and extremity edema.    Expected Outcomes  Improve functional capacity of life;Short term: Attendance in program 2-3 days a week with increased exercise capacity. Reported lower sodium intake. Reported increased fruit and vegetable intake. Reports medication compliance.;Short term: Daily weights obtained and reported for increase. Utilizing diuretic protocols set by physician.;Long term: Adoption of self-care skills and reduction of barriers for early signs and symptoms recognition and intervention leading to self-care maintenance.    Hypertension  Yes    Intervention  Provide education on lifestyle modifcations including regular physical activity/exercise, weight management, moderate sodium restriction and increased consumption of fresh fruit, vegetables, and low fat dairy, alcohol moderation, and smoking cessation.;Monitor prescription use compliance.    Expected Outcomes  Short Term: Continued assessment and intervention until BP is < 140/9036mG in hypertensive participants. < 130/33m71m in hypertensive participants with diabetes, heart failure or chronic kidney disease.;Long Term: Maintenance of blood pressure at goal levels.  Lipids   Yes    Intervention  Provide education and support for participant on nutrition & aerobic/resistive exercise along with prescribed medications to achieve LDL <32m, HDL >454m    Expected Outcomes  Short Term: Participant states understanding of desired cholesterol values and is compliant with medications prescribed. Participant is following exercise prescription and nutrition guidelines.;Long Term: Cholesterol controlled with medications as prescribed, with individualized exercise RX and with personalized nutrition plan. Value goals: LDL < 7026mHDL > 40 mg.    Stress  Yes    Intervention  Offer individual and/or small group education and counseling on adjustment to heart disease, stress management and health-related lifestyle change. Teach and support self-help strategies.;Refer participants experiencing significant psychosocial distress to appropriate mental health specialists for further evaluation and treatment. When possible, include family members and significant others in education/counseling sessions. Loss of three family members in the past year. One being his wife of 37 84ars.  Continues to grieve for his wife.Is attending grief counseling program.     Expected Outcomes  Short Term: Participant demonstrates changes in health-related behavior, relaxation and other stress management skills, ability to obtain effective social support, and compliance with psychotropic medications if prescribed.;Long Term: Emotional wellbeing is indicated by absence of clinically significant psychosocial distress or social isolation.       Core Components/Risk Factors/Patient Goals Review:  Goals and Risk Factor Review    Row Name 02/15/18 1514 03/03/18 1151           Core Components/Risk Factors/Patient Goals Review   Personal Goals Review  Weight Management/Obesity;Heart Failure;Hypertension;Tobacco Cessation;Lipids;Improve shortness of breath with ADL's  Weight Management/Obesity;Heart  Failure;Hypertension;Tobacco Cessation;Lipids;Improve shortness of breath with ADL's      Review  JohShivens been doing well in rehab.  His weight has been holding steady but he would like to gain weight.  His goal is to get back up to 145 lbs but he wants to build up muscle.  He has an appointment with the dietician during class next week to help with weight gain.  He is doing a little better with his shortness of breath but there are some days that it is worse than others.   He continues to work on his heart failure and has not had any symptoms.  He monitors his blood pressures at home at least 3-4x a week and has it checked in class as well.  He is doing well on his medications.   For tobacco cessation he is getting better.  He is staying between 6-18 cigarettes a day versus a full pack.  He is also set up to start a one on one smoking cessation class since the current classes were already in progress and did not restart until April.  He has strated to make the right steps.  We talked a little about combination therapy as he would not like to take Chantix again.  I also gave him a fake cigarette to hold and use in lieu of a real one.  We will continue to monitor his tobacco use.  JonTamela Oddis been attending class regularly and has been working hard to stop smoking. He started wearing the patch 2 weeks ago. He still smokes about 16-18 cigarettes a day. His blood pressure has been withing normal limits which he checks at home. His shortness of breath is getting a little better but somedays are rougher than others. He has yet to go to the smoking cessation class on March 19th.  Expected Outcomes  Short: Start smoking cessation classes.  Long: Continue to work on weight gain.   Short: attened smoking cessation class on the 19th. Long: quit smoking.         Core Components/Risk Factors/Patient Goals at Discharge (Final Review):  Goals and Risk Factor Review - 03/03/18 1151      Core Components/Risk  Factors/Patient Goals Review   Personal Goals Review  Weight Management/Obesity;Heart Failure;Hypertension;Tobacco Cessation;Lipids;Improve shortness of breath with ADL's    Review  Tamela Oddi has been attending class regularly and has been working hard to stop smoking. He started wearing the patch 2 weeks ago. He still smokes about 16-18 cigarettes a day. His blood pressure has been withing normal limits which he checks at home. His shortness of breath is getting a little better but somedays are rougher than others. He has yet to go to the smoking cessation class on March 19th.    Expected Outcomes  Short: attened smoking cessation class on the 19th. Long: quit smoking.       ITP Comments: ITP Comments    Row Name 01/24/18 1218 01/30/18 0936 02/24/18 1227 02/27/18 0838 03/27/18 0901   ITP Comments  Medical review completed today. ITP to Dr Sabra Heck for review, changes as needed and signature.   Documentation of diagnosis can be found in [facility] Encounter 12/25/2017.  New admission. Did not complete program last year, because his wife became very sick and died in 07-15-23.   30 day review completed. ITP sent to Dr. Emily Filbert Director of [place]. Continue with ITP unless changes are made by physician.  Deniro's daughter delivered his Nicotine patches during class. He is very motivated to quit smoking and has an appointment with a [employer] rep in a couple weeks   30 day review completed. ITP sent to Dr. Emily Filbert Director of [place]. Continue with ITP unless changes are made by physician.   30 day review completed. ITP sent to Dr. Emily Filbert Director of [place]. Continue with ITP unless changes are made by physician   Row Name 04/12/18 1456 04/24/18 0853         ITP Comments  Called Sheri to check on status of return.  He has been out after having a difficult time breathing with the pollen. He was in the ED on Monday.  He was started on new medication and is trying to get a follow up appointment with his  PCP and will return when he is able.  He wants to finish the program this time!   30 day review completed. ITP sent to Dr. Emily Filbert Director of [place]. Continue with ITP unless changes are made by physician         Comments: 30 day review

## 2018-04-25 ENCOUNTER — Ambulatory Visit: Payer: BLUE CROSS/BLUE SHIELD | Admitting: Psychology

## 2018-04-25 ENCOUNTER — Encounter: Payer: Self-pay | Admitting: *Deleted

## 2018-04-25 DIAGNOSIS — F411 Generalized anxiety disorder: Secondary | ICD-10-CM | POA: Diagnosis not present

## 2018-04-25 DIAGNOSIS — F331 Major depressive disorder, recurrent, moderate: Secondary | ICD-10-CM

## 2018-04-25 DIAGNOSIS — J449 Chronic obstructive pulmonary disease, unspecified: Secondary | ICD-10-CM

## 2018-04-26 ENCOUNTER — Ambulatory Visit: Payer: BLUE CROSS/BLUE SHIELD

## 2018-04-27 ENCOUNTER — Telehealth: Payer: Self-pay | Admitting: Family Medicine

## 2018-04-27 ENCOUNTER — Ambulatory Visit
Admission: RE | Admit: 2018-04-27 | Discharge: 2018-04-27 | Disposition: A | Discharge status: Home / Self Care | Payer: BLUE CROSS/BLUE SHIELD | Source: Ambulatory Visit | Attending: Oncology | Admitting: Oncology

## 2018-04-27 DIAGNOSIS — I2510 Atherosclerotic heart disease of native coronary artery without angina pectoris: Secondary | ICD-10-CM | POA: Diagnosis not present

## 2018-04-27 DIAGNOSIS — I700 Atherosclerosis of aorta: Secondary | ICD-10-CM | POA: Insufficient documentation

## 2018-04-27 DIAGNOSIS — J439 Emphysema, unspecified: Secondary | ICD-10-CM | POA: Diagnosis not present

## 2018-04-27 DIAGNOSIS — R911 Solitary pulmonary nodule: Secondary | ICD-10-CM | POA: Insufficient documentation

## 2018-04-27 IMAGING — CT CT CHEST W/ CM
1 series · 15 of 34 positions shown, 19 images · IV contrast (iopamidol)
Comparison: [DATE]

CLINICAL DATA: Followup pulmonary nodule. Shortness of breath since
[REDACTED].

EXAM:
CT CHEST WITH CONTRAST
TECHNIQUE: Multidetector CT imaging of the chest was performed during
intravenous contrast administration.
CONTRAST:  75mL [KQ] IOPAMIDOL ([KQ]) INJECTION 76%

[Series 2: axial st · axial · 0.73mm/px · z∈[-676,-378]mm · 15 of 177 slices shown, 19 images]
[im 14/177  mediastinal]
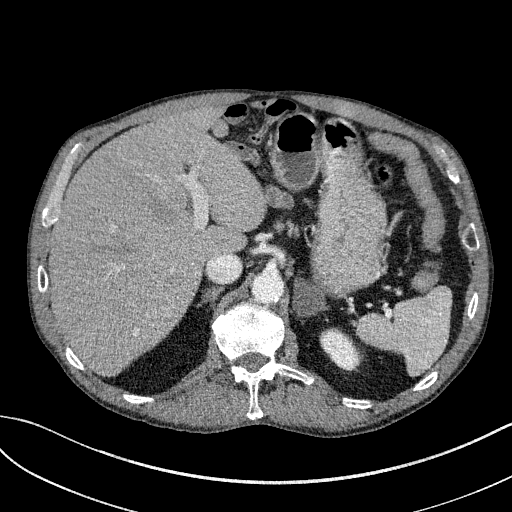
[im 14/177  lung]
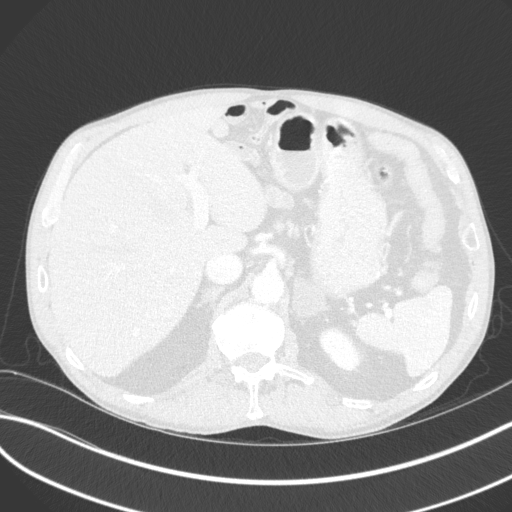
[im 27/177  lung]
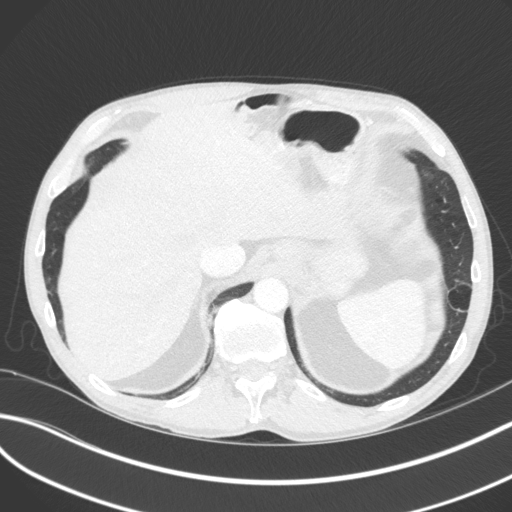
[im 36/177  lung]
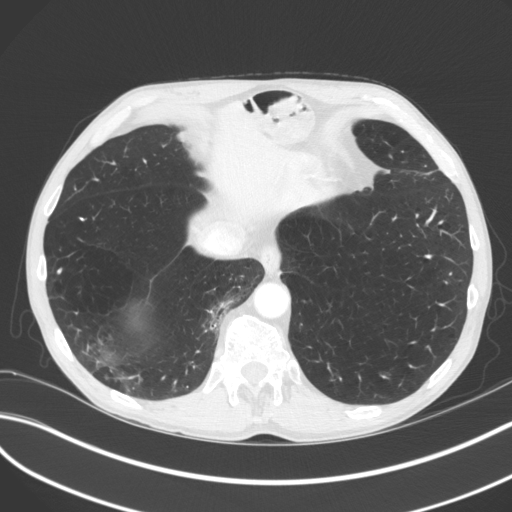
[im 46/177  lung]
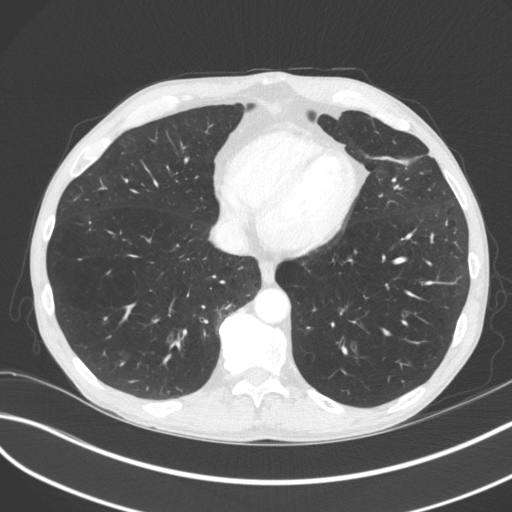
[im 59/177  mediastinal]
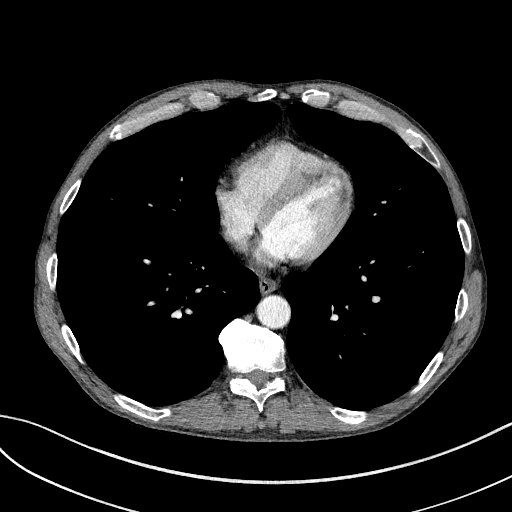
[im 59/177  lung]
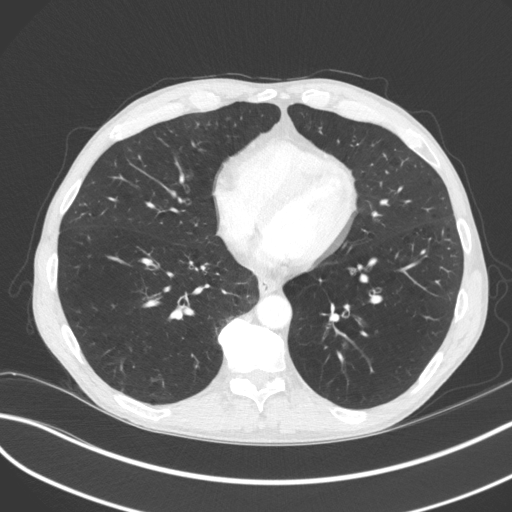
[im 71/177  lung]
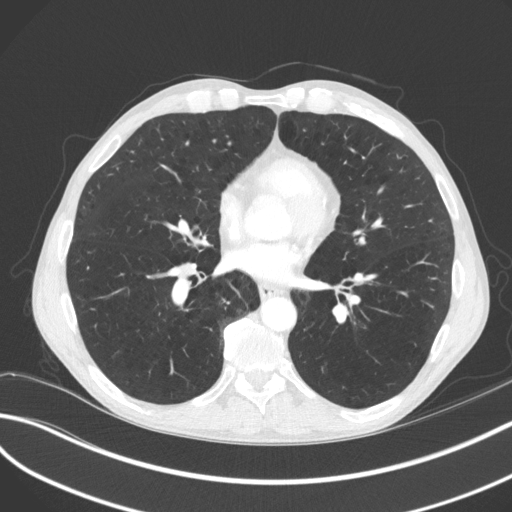
[im 79/177  lung]
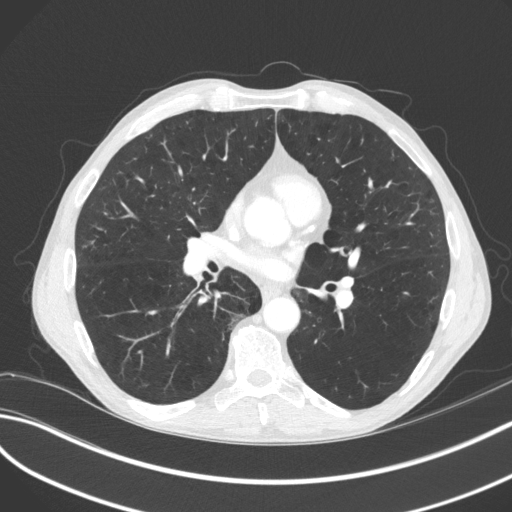
[im 92/177  lung]
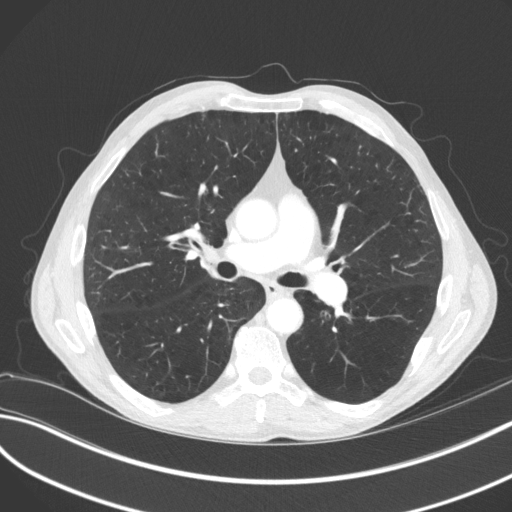
[im 98/177  mediastinal]
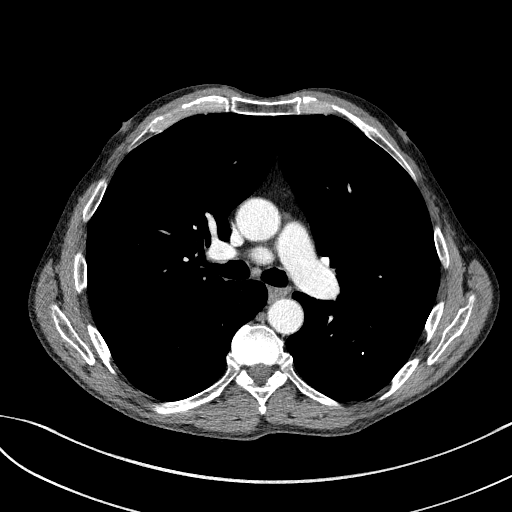
[im 98/177  lung]
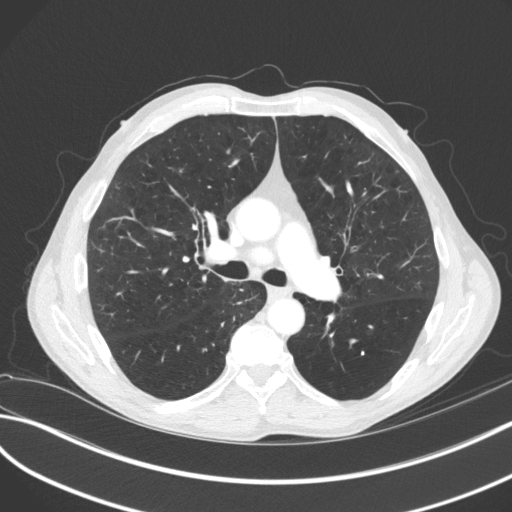
[im 106/177  lung]
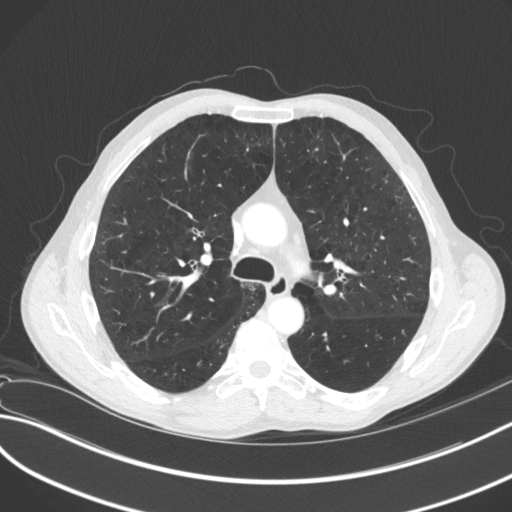
[im 118/177  lung]
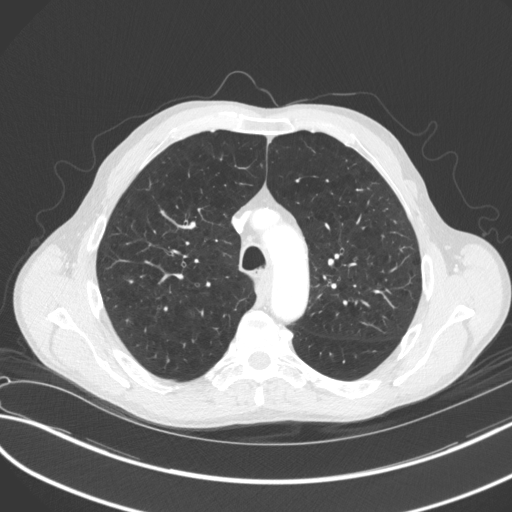
[im 131/177  lung]
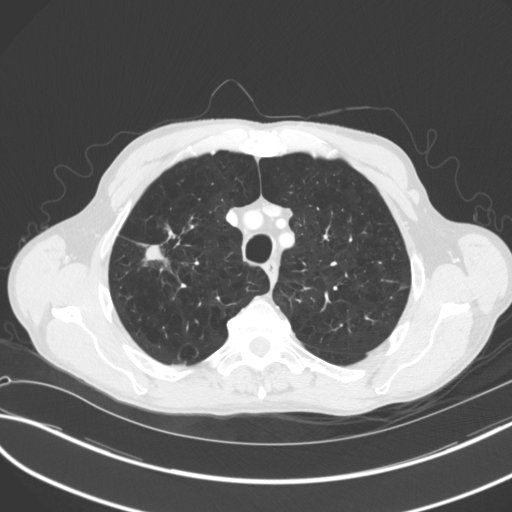
[im 141/177  mediastinal]
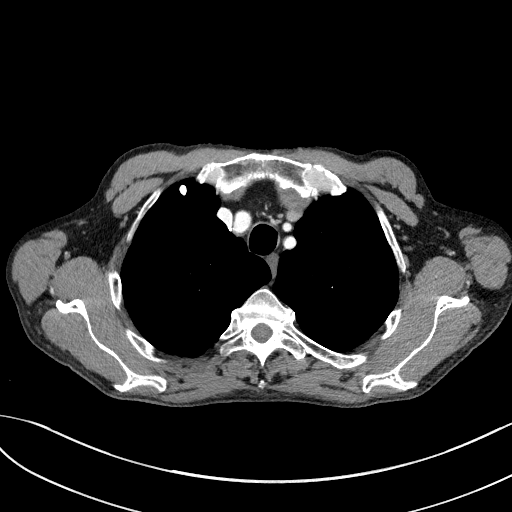
[im 141/177  lung]
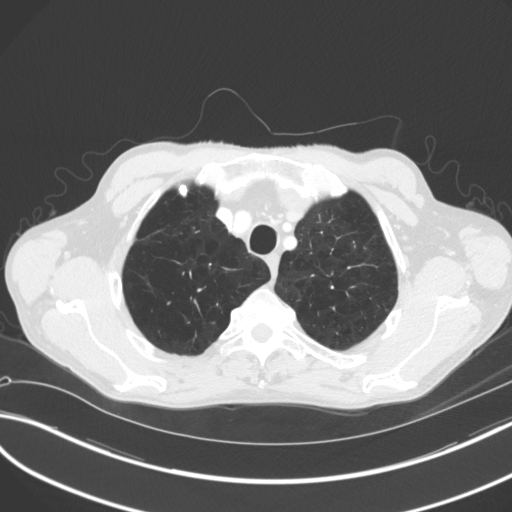
[im 150/177  lung]
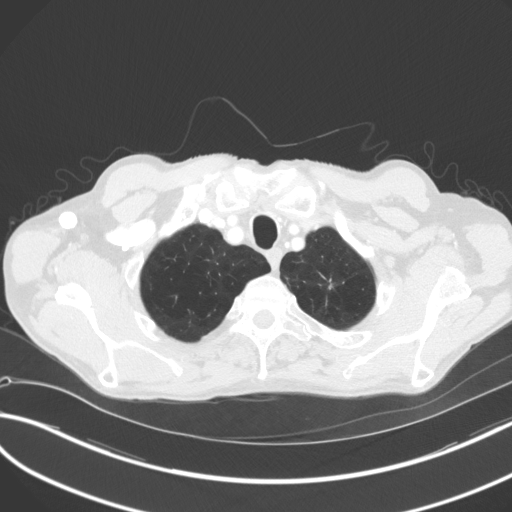
[im 163/177  lung]
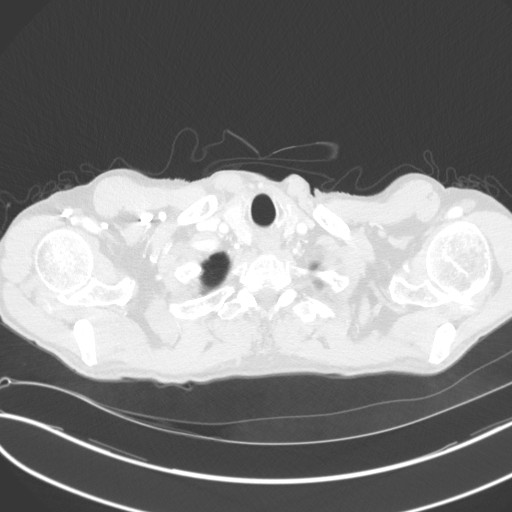

[15 of 34 positions shown; findings below may reference images not displayed]

FINDINGS: Cardiovascular: The heart size is normal. Aortic atherosclerosis
noted. LAD and RCA coronary artery atherosclerotic calcifications
noted.

Mediastinum/Nodes: Normal appearance of the thyroid gland. The
trachea appears patent and is midline. Normal appearance of the
esophagus. No enlarged mediastinal or hilar lymph nodes.

Lungs/Pleura: Advanced changes of emphysema. Nodule within the left
apex is unchanged measuring 5 mm, image [DATE]. The left upper lobe
lung nodule measures 1.0 x 1.1 cm, image 50/3. Previously 1.0 x
cm. The index nodule within the right upper lobe measures 1 cm,
image 48/3. Previously 1.8 cm. Persistent but improved appearance of
surrounding masslike architectural distortion and ground-glass
attenuation. No new lesions identified.

Upper Abdomen: No acute findings identified. Unchanged appearance of
bilateral adrenal nodules compatible with benign adenomas.

Musculoskeletal: Spondylosis identified within the thoracic spine.
No aggressive lytic or sclerotic bone lesions.
IMPRESSION: 1. Interval improvement in masslike architectural distortion and
ground-glass attenuation surrounding the index nodule within the
right upper lobe. The underlying lung nodule is decreased in size
compared with [DATE].
2. Mild decrease in size of left upper lobe lung nodule.
3. No new or progressive changes identified.
4. Aortic Atherosclerosis ([KQ]-[KQ]) and Emphysema ([KQ]-[KQ]).
5. LAD and RCA coronary artery atherosclerotic calcifications

## 2018-04-27 MED ORDER — IOPAMIDOL (ISOVUE-370) INJECTION 76%
75.0000 mL | Freq: Once | INTRAVENOUS | Status: AC | PRN
  Administered 2018-04-27: 75 mL via INTRAVENOUS

## 2018-04-27 NOTE — Telephone Encounter (Unsigned)
Copied from [place] [phone]. Topic: Quick Communication - See Telephone Encounter >> Apr 27, 2018  2:19 PM Neva Seat wrote: Dr. Nicolasa Ducking - needing last visit notes of pt. [phone] - phone [phone] - fax

## 2018-04-27 NOTE — Telephone Encounter (Signed)
Please send

## 2018-04-28 NOTE — Telephone Encounter (Signed)
Sent via fax 04/28/18

## 2018-05-01 NOTE — Progress Notes (Signed)
[place]  [phone] [phone]  ID: Leslie Sims OB: 1951-03-22  MR#: [id number]  HBZ#:[id number]  Patient Care Team: [name], MD as PCP - General (Family Medicine) [name], MD (Family Medicine) Wellington Hampshire, MD as Consulting Physician (Cardiology) Telford Nab, RN as Registered Nurse  CHIEF COMPLAINT: Bilateral pulmonary nodules.  INTERVAL HISTORY: Patient returns to clinic today for further evaluation and discussion of his imaging results.  He continues to smoke heavily, but states he is cutting back significantly.  He currently feels well and is at his baseline.  He has chronic shortness of breath and requires oxygen, mainly at night and when he is active.  He has no neurologic complaints.  He has a good appetite and denies weight loss.  He denies any recent fevers. He denies any chest pain, cough, or hemoptysis.  He has no nausea, vomiting, constipation, or diarrhea.  No urinary complaints.  Patient offers no further specific complaints today.  REVIEW OF SYSTEMS:   Review of Systems  Constitutional: Negative.  Negative for fever, malaise/fatigue and weight loss.  Respiratory: Positive for shortness of breath. Negative for cough and hemoptysis.   Cardiovascular: Negative.  Negative for chest pain and leg swelling.  Gastrointestinal: Negative.  Negative for abdominal pain, blood in stool and melena.  Genitourinary: Negative.   Musculoskeletal: Negative.  Negative for joint pain.  Skin: Negative.  Negative for rash.  Neurological: Negative.  Negative for sensory change and weakness.  Psychiatric/Behavioral: Negative.  The patient is not nervous/anxious.     As per HPI. Otherwise, a complete review of systems is negative.  PAST MEDICAL HISTORY: Past Medical History:  Diagnosis Date  . Allergic rhinitis   . Anxiety   . Atherosclerosis   . Back pain   . Bronchitis    09-14-11  . CARDIOMYOPATHY 03/24/2010   did not tolerate Toprol and Lisinopril.   . Chest pain   . CHF (congestive heart failure) ([place])   . Chronic airway obstruction, not elsewhere classified   . Colon polyps 08/2012   colonoscopy; multiple colon polyps; repeat colonoscopy in one year.  [name] COPD (chronic obstructive pulmonary disease) ([place])   . Coronary artery disease 11/2009   Anterior MI with late presentation 11/2009. Cath showed a 100% proximal LAD occlusion, 99% mid RCA. Had a PCI and 2 DES to both LAD and RCA. EF was 35%. Nuclear stress test 05/13: prior anterior/inferior infarcts without ischemic, EF 43%, cardiac cath in 02/14: Widely patent stents with no other obstructive disease. EF 40%   . Depression   . GERD (gastroesophageal reflux disease)   . Headache(784.0)   . Helicobacter pylori (H. pylori)   . Hemorrhoids   . Hernia    inguinal  . Hyperlipidemia   . Hypertension   . Insomnia   . MI (myocardial infarction) ([place])   . Myalgia   . Peptic ulcer   . Shortness of breath dyspnea   . Status post dilation of esophageal narrowing 2000  . Thyroid dysfunction   . Tinea pedis   . Wears dentures    partial upper    PAST SURGICAL HISTORY: Past Surgical History:  Procedure Laterality Date  . Admission  12/20/2012   COPD exacerbation.  [facility].  [name] CARDIAC CATHETERIZATION    . CARDIAC CATHETERIZATION  02/05/13   [facility]  . CARDIAC CATHETERIZATION  10/14   [facility] : patent stents with no change in anatomy. EF: 40$  . CARDIAC CATHETERIZATION Left 11/12/2016  Procedure: Left Heart Cath and Coronary Angiography;  Surgeon: Wellington Hampshire, MD;  Location: [facility];  Service: Cardiovascular;  Laterality: Left;  . COLONOSCOPY    . COLONOSCOPY WITH PROPOFOL N/A 05/18/2016   Procedure: COLONOSCOPY WITH PROPOFOL;  Surgeon: Lucilla Lame, MD;  Location: [facility]COPY;  Service: Endoscopy;  Laterality: N/A;  . CORONARY ANGIOPLASTY WITH STENT PLACEMENT  09/2009   LAD 3.0 X23 mm Xience DES, RCA: 4.0 X 15 mm Xience DES  .  ELECTROMAGNETIC NAVIGATION BROCHOSCOPY N/A 10/27/2017   Procedure: ELECTROMAGNETIC NAVIGATION BRONCHOSCOPY;  Surgeon: Flora Lipps, MD;  Location: [facility];  Service: Cardiopulmonary;  Laterality: N/A;  . ESOPHAGEAL DILATION    . ESOPHAGOGASTRODUODENOSCOPY  2008  . ESOPHAGOGASTRODUODENOSCOPY  08/20/2012  . HERNIA REPAIR  07/20/2012   L inguinal hernia repair  . PENILE PROSTHESIS IMPLANT      FAMILY HISTORY: Family History  Problem Relation Age of Onset  . Heart attack Brother        Brother #1  . Diabetes Brother   . Hypertension Brother        #3  . Coronary artery disease Father 23       deceased  . Heart attack Father   . Diabetes Father   . Heart disease Father   . COPD Mother 53       deceased  . Alcohol abuse Sister        polysubstance abuse  . COPD Sister   . Lung cancer Sister   . Alcohol abuse Sister        polysubstance abuse  . Penile cancer Brother   . Diabetes Brother     ADVANCED DIRECTIVES (Y/N):  N  HEALTH MAINTENANCE: Social History   Tobacco Use  . Smoking status: Current Every Day Smoker    Packs/day: 0.50    Years: 42.00    Pack years: 21.00    Types: Cigarettes  . Smokeless tobacco: Never Used  . Tobacco comment: 02/15/18 For tobacco cessation he is getting better.  He is staying between 6-18 cigarettes a day versus a full pack.  He is also set up to start a one on one smoking cessation class since the current classes were already in progress and did not restart un  Substance Use Topics  . Alcohol use: No    Alcohol/week: 0.0 oz  . Drug use: No     Colonoscopy:  PAP:  Bone density:  Lipid panel:  Allergies  Allergen Reactions  . Prozac [Fluoxetine Hcl] Shortness Of Breath  . Effexor Xr [Venlafaxine Hcl Er]   . Wellbutrin [Bupropion] Other (See Comments)    Unknown/"made me feel real funny"    Current Outpatient Medications  Medication Sig Dispense Refill  . aspirin 81 MG tablet Take 81 mg by mouth daily.      [name] atorvastatin  (LIPITOR) 40 MG tablet Take 1 tablet (40 mg total) by mouth daily. 90 tablet 3  . Fluticasone-Salmeterol (ADVAIR DISKUS) 500-50 MCG/DOSE AEPB Inhale 1 puff into the lungs 2 (two) times daily. 180 each 3  . LORazepam (ATIVAN) 1 MG tablet TAKE 1/2 TABLET BY MOUTH EVERY 6 HOURS AS NEEDED FOR ANXIETY 60 tablet 2  . losartan (COZAAR) 25 MG tablet TAKE 1 TABLET DAILY 90 tablet 3  . mirtazapine (REMERON) 15 MG tablet Take 1 tablet (15 mg total) by mouth at bedtime. 30 tablet 5  . Multiple Vitamin (MULTIVITAMIN) capsule Take 1 capsule by mouth daily.    [name] albuterol (VENTOLIN HFA)  108 (90 Base) MCG/ACT inhaler USE 2 INHALATIONS EVERY 6 HOURS AS NEEDED FOR WHEEZING (Patient not taking: Reported on 05/03/2018) 48 g 3  . FLUoxetine (PROZAC) 20 MG tablet Take 1 tablet (20 mg total) by mouth daily. 30 tablet 3  . furosemide (LASIX) 20 MG tablet Take 1 tablet (20 mg total) by mouth daily. 30 tablet 0  . ipratropium-albuterol (DUONEB) 0.5-2.5 (3) MG/3ML SOLN Take 3 mLs by nebulization every 6 (six) hours as needed. (Patient not taking: Reported on 05/03/2018) 225 mL 3  . ketoconazole (NIZORAL) 2 % cream Apply 1 application topically 2 (two) times daily. (Patient not taking: Reported on 05/03/2018) 60 g 0  . nitroGLYCERIN (NITROSTAT) 0.4 MG SL tablet Place 1 tablet (0.4 mg total) under the tongue every 5 (five) minutes as needed. (Patient not taking: Reported on 05/03/2018) 25 tablet 3  . sucralfate (CARAFATE) 1 GM/10ML suspension Take 10 mLs (1 g total) by mouth 4 (four) times daily -  with meals and at bedtime. (Patient not taking: Reported on 05/03/2018) 420 mL 4  . tiotropium (SPIRIVA HANDIHALER) 18 MCG inhalation capsule Place 1 capsule (18 mcg total) into inhaler and inhale daily. (Patient not taking: Reported on 05/03/2018) 90 capsule 3   No current facility-administered medications for this visit.     OBJECTIVE: Vitals:   05/03/18 1103  BP: 120/79  Pulse: (!) 102  Resp: 18  Temp: 97.7 F (36.5 C)     Body  mass index is 19.54 kg/m.    ECOG FS:0 - Asymptomatic  General: Well-developed, well-nourished, no acute distress. Eyes: Pink conjunctiva, anicteric sclera. Lungs: Clear to auscultation bilaterally. Heart: Regular rate and rhythm. No rubs, murmurs, or gallops. Abdomen: Soft, nontender, nondistended. No organomegaly noted, normoactive bowel sounds. Musculoskeletal: No edema, cyanosis, or clubbing. Neuro: Alert, answering all questions appropriately. Cranial nerves grossly intact. Skin: No rashes or petechiae noted. Psych: Normal affect.  LAB RESULTS:  Lab Results  Component Value Date   NA 136 04/10/2018   K 4.2 04/10/2018   CL 98 (L) 04/10/2018   CO2 30 04/10/2018   GLUCOSE 116 (H) 04/10/2018   BUN 20 04/10/2018   CREATININE 0.65 04/10/2018   CALCIUM 9.6 04/10/2018   PROT 7.1 12/25/2017   ALBUMIN 3.9 12/25/2017   AST 22 12/25/2017   ALT 29 12/25/2017   ALKPHOS 66 12/25/2017   BILITOT 0.7 12/25/2017   GFRNONAA >60 04/10/2018   GFRAA >60 04/10/2018    Lab Results  Component Value Date   WBC 10.5 04/10/2018   NEUTROABS 4.7 12/25/2017   HGB 14.3 04/10/2018   HCT 41.3 04/10/2018   MCV 90.4 04/10/2018   PLT 317 04/10/2018     STUDIES: Dg Chest 2 View  Result Date: 04/10/2018 CLINICAL DATA:  Shortness of breath today, history COPD, bronchitis EXAM: CHEST - 2 VIEW COMPARISON:  12/25/2017 Correlation: CT chest 02/01/2018 FINDINGS: Normal heart size, mediastinal contours, and pulmonary vascularity. Emphysematous changes consistent with COPD. Somewhat linear opacity in the RIGHT upper lobe, question atelectasis or scarring, appears decreased since prior CT. Vague nodular density LEFT upper lobe again identified, 11 mm diameter. No infiltrate, pleural effusion or pneumothorax. Tiny radiopacities at the RIGHT lung base question aspirated contrast material within LEFT lower lobe bronchi. Atherosclerotic calcifications aorta. Bones demineralized. IMPRESSION: COPD changes with  decreased opacity in the RIGHT upper lobe question atelectasis though requiring follow-up till complete resolution to exclude mass. Persistent LEFT upper lobe nodule 11 mm diameter. Electronically Signed   By: Crist Infante.D.  On: 04/10/2018 18:17   Ct Chest W Contrast  Result Date: 04/28/2018 CLINICAL DATA:  Followup pulmonary nodule. Shortness of breath since Monday. EXAM: CT CHEST WITH CONTRAST TECHNIQUE: Multidetector CT imaging of the chest was performed during intravenous contrast administration. CONTRAST:  61mL ISOVUE-370 IOPAMIDOL (ISOVUE-370) INJECTION 76% COMPARISON:  02/01/2018 FINDINGS: Cardiovascular: The heart size is normal. Aortic atherosclerosis noted. LAD and RCA coronary artery atherosclerotic calcifications noted. Mediastinum/Nodes: Normal appearance of the thyroid gland. The trachea appears patent and is midline. Normal appearance of the esophagus. No enlarged mediastinal or hilar lymph nodes. Lungs/Pleura: Advanced changes of emphysema. Nodule within the left apex is unchanged measuring 5 mm, image 27/3. The left upper lobe lung nodule measures 1.0 x 1.1 cm, image 50/3. Previously 1.0 x 1.4 cm. The index nodule within the right upper lobe measures 1 cm, image 48/3. Previously 1.8 cm. Persistent but improved appearance of surrounding masslike architectural distortion and ground-glass attenuation. No new lesions identified. Upper Abdomen: No acute findings identified. Unchanged appearance of bilateral adrenal nodules compatible with benign adenomas. Musculoskeletal: Spondylosis identified within the thoracic spine. No aggressive lytic or sclerotic bone lesions. IMPRESSION: 1. Interval improvement in masslike architectural distortion and ground-glass attenuation surrounding the index nodule within the right upper lobe. The underlying lung nodule is decreased in size compared with 02/01/2018. 2. Mild decrease in size of left upper lobe lung nodule. 3. No new or progressive changes identified.  4. Aortic Atherosclerosis (ICD10-I70.0) and Emphysema (ICD10-J43.9). 5. LAD and RCA coronary artery atherosclerotic calcifications Electronically Signed   By: Kerby Moors M.D.   On: 04/28/2018 13:00    ASSESSMENT: Bilateral pulmonary nodule.  PLAN:    1. Bilateral pulmonary nodule: Previously, patient underwent biopsies of both lesions on October 27, 2017 that were negative for malignancies.  CT scan results from Apr 28, 2018 reviewed independently and report as above with interval improvement of both lesions.  No intervention is needed at this time.  Will repeat CT scans in 6 months to assess for continued interval change.  If lesions increase in size, will consider repeat biopsy or possibly treatment with SBRT.  Return to clinic 1 to 2 days after his imaging for further evaluation and discussion of the results. 2.  Smoking cessation: Patient says he continues to cut back his tobacco use.  Approximately 20 minutes was spent in discussion of which greater than 50% was consultation.  Patient expressed understanding and was in agreement with this plan. He also understands that He can call clinic at any time with any questions, concerns, or complaints.   Cancer Staging No matching staging information was found for the patient.  Lloyd Huger, MD   05/05/2018 1:04 PM

## 2018-05-02 ENCOUNTER — Ambulatory Visit: Payer: BLUE CROSS/BLUE SHIELD | Admitting: Psychology

## 2018-05-03 ENCOUNTER — Inpatient Hospital Stay: Payer: BLUE CROSS/BLUE SHIELD | Attending: Oncology | Admitting: Oncology

## 2018-05-03 ENCOUNTER — Encounter: Payer: Self-pay | Admitting: Oncology

## 2018-05-03 VITALS — BP 120/79 | HR 102 | Temp 97.7°F | Resp 18 | Ht 70.0 in | Wt 136.2 lb

## 2018-05-03 DIAGNOSIS — R918 Other nonspecific abnormal finding of lung field: Secondary | ICD-10-CM | POA: Diagnosis not present

## 2018-05-03 DIAGNOSIS — I10 Essential (primary) hypertension: Secondary | ICD-10-CM | POA: Diagnosis not present

## 2018-05-03 DIAGNOSIS — F17210 Nicotine dependence, cigarettes, uncomplicated: Secondary | ICD-10-CM | POA: Insufficient documentation

## 2018-05-03 DIAGNOSIS — R0602 Shortness of breath: Secondary | ICD-10-CM | POA: Diagnosis not present

## 2018-05-03 NOTE — Progress Notes (Signed)
No new changes noted today 

## 2018-05-09 ENCOUNTER — Telehealth: Payer: Self-pay | Admitting: *Deleted

## 2018-05-09 ENCOUNTER — Encounter: Payer: Self-pay | Admitting: *Deleted

## 2018-05-09 NOTE — Telephone Encounter (Signed)
Called to check on status of return to rehab.  He said that he is feeling better and ready to get back. He is going to aim to return tomorrow or Friday.

## 2018-05-16 ENCOUNTER — Telehealth: Payer: Self-pay | Admitting: Family Medicine

## 2018-05-16 ENCOUNTER — Encounter: Payer: Self-pay | Admitting: Family Medicine

## 2018-05-16 ENCOUNTER — Other Ambulatory Visit: Payer: Self-pay | Admitting: Family Medicine

## 2018-05-16 MED ORDER — SUCRALFATE 1 GM/10ML PO SUSP
1.0000 g | Freq: Three times a day (TID) | ORAL | 3 refills | Status: DC
Start: 2018-05-16 — End: 2019-12-04

## 2018-05-16 MED ORDER — PREDNISONE 20 MG PO TABS: 20.0000 mg | ORAL_TABLET | Freq: Every day | ORAL | 0 refills | Status: DC

## 2018-05-16 MED ORDER — DOXYCYCLINE HYCLATE 100 MG PO TABS: 100.0000 mg | ORAL_TABLET | Freq: Two times a day (BID) | ORAL | 0 refills | Status: DC

## 2018-05-16 NOTE — Telephone Encounter (Signed)
Patient called -- two weeks of head congestion, wheezing, DOE.  No fever/chills/sweats.  Some rhinorrhea; some nasal congestion. Using Flonase and nasal saline.  Some chest congestion; light green.  Pulse oximetry is maintaining 92-94%.  Nebulizer once to every 5-6 hours.  A/P: sinsusitis with COPD exacerbation: Doxycycline and Prednisone.  Upcoming appointment this week.

## 2018-05-17 ENCOUNTER — Telehealth: Payer: Self-pay | Admitting: *Deleted

## 2018-05-17 NOTE — Telephone Encounter (Signed)
Leslie Sims called to inform staff that he has an upper respiratory infection this week. His doctor prescribed medication that he started this morning. He stated that he will be out the rest of this week and plans to return next week.

## 2018-05-22 ENCOUNTER — Other Ambulatory Visit: Payer: Self-pay

## 2018-05-22 ENCOUNTER — Ambulatory Visit: Payer: BLUE CROSS/BLUE SHIELD | Admitting: Family Medicine

## 2018-05-22 ENCOUNTER — Ambulatory Visit (INDEPENDENT_AMBULATORY_CARE_PROVIDER_SITE_OTHER): Payer: BLUE CROSS/BLUE SHIELD

## 2018-05-22 ENCOUNTER — Encounter: Payer: Self-pay | Admitting: Family Medicine

## 2018-05-22 VITALS — BP 102/68 | HR 90 | Temp 98.0°F | Resp 16 | Ht 68.9 in | Wt 138.0 lb

## 2018-05-22 DIAGNOSIS — J449 Chronic obstructive pulmonary disease, unspecified: Secondary | ICD-10-CM

## 2018-05-22 DIAGNOSIS — F341 Dysthymic disorder: Secondary | ICD-10-CM | POA: Diagnosis not present

## 2018-05-22 DIAGNOSIS — R739 Hyperglycemia, unspecified: Secondary | ICD-10-CM | POA: Diagnosis not present

## 2018-05-22 DIAGNOSIS — I2510 Atherosclerotic heart disease of native coronary artery without angina pectoris: Secondary | ICD-10-CM | POA: Diagnosis not present

## 2018-05-22 DIAGNOSIS — R0902 Hypoxemia: Secondary | ICD-10-CM | POA: Diagnosis not present

## 2018-05-22 IMAGING — DX DG CHEST 2V
3 series · 3 of 3 positions shown · non-contrast
Comparison: Radiographs [DATE].  CT scan [DATE].

CLINICAL DATA: Hypoxia.

EXAM:
CHEST - 2 VIEW

[chest pa (1 of 2)]
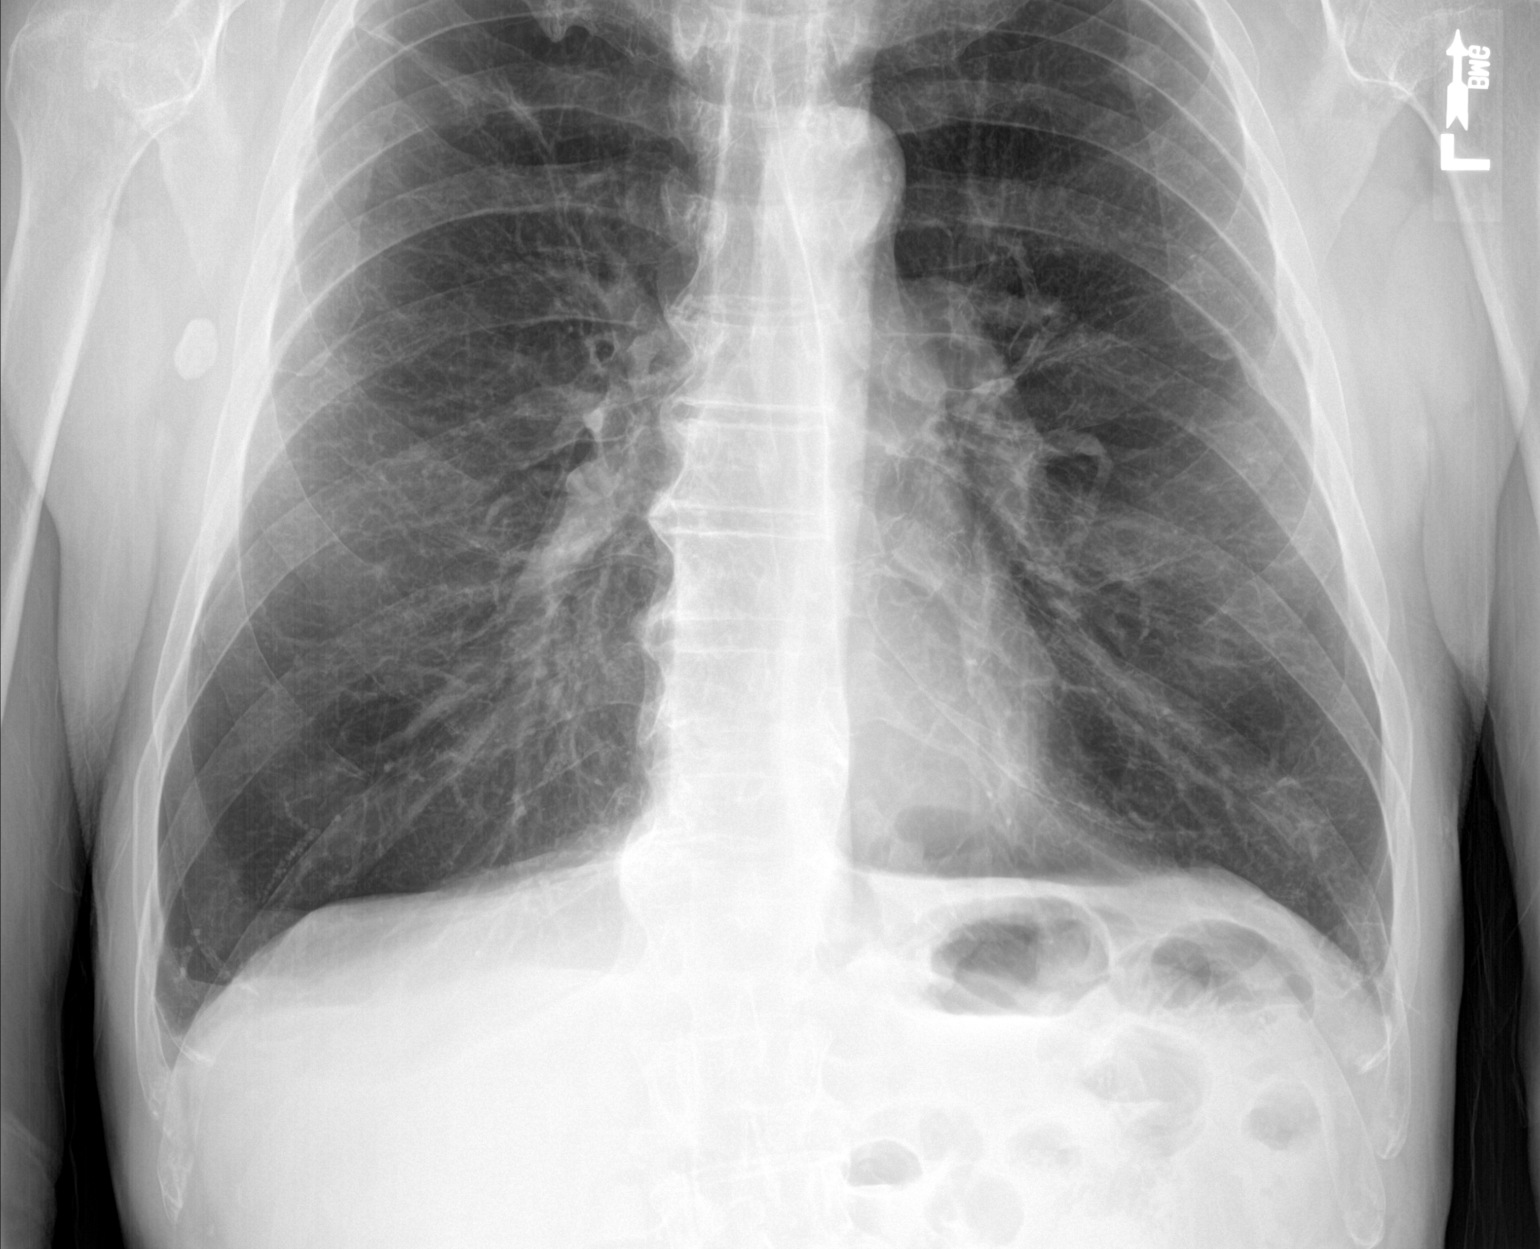

[chest lat]
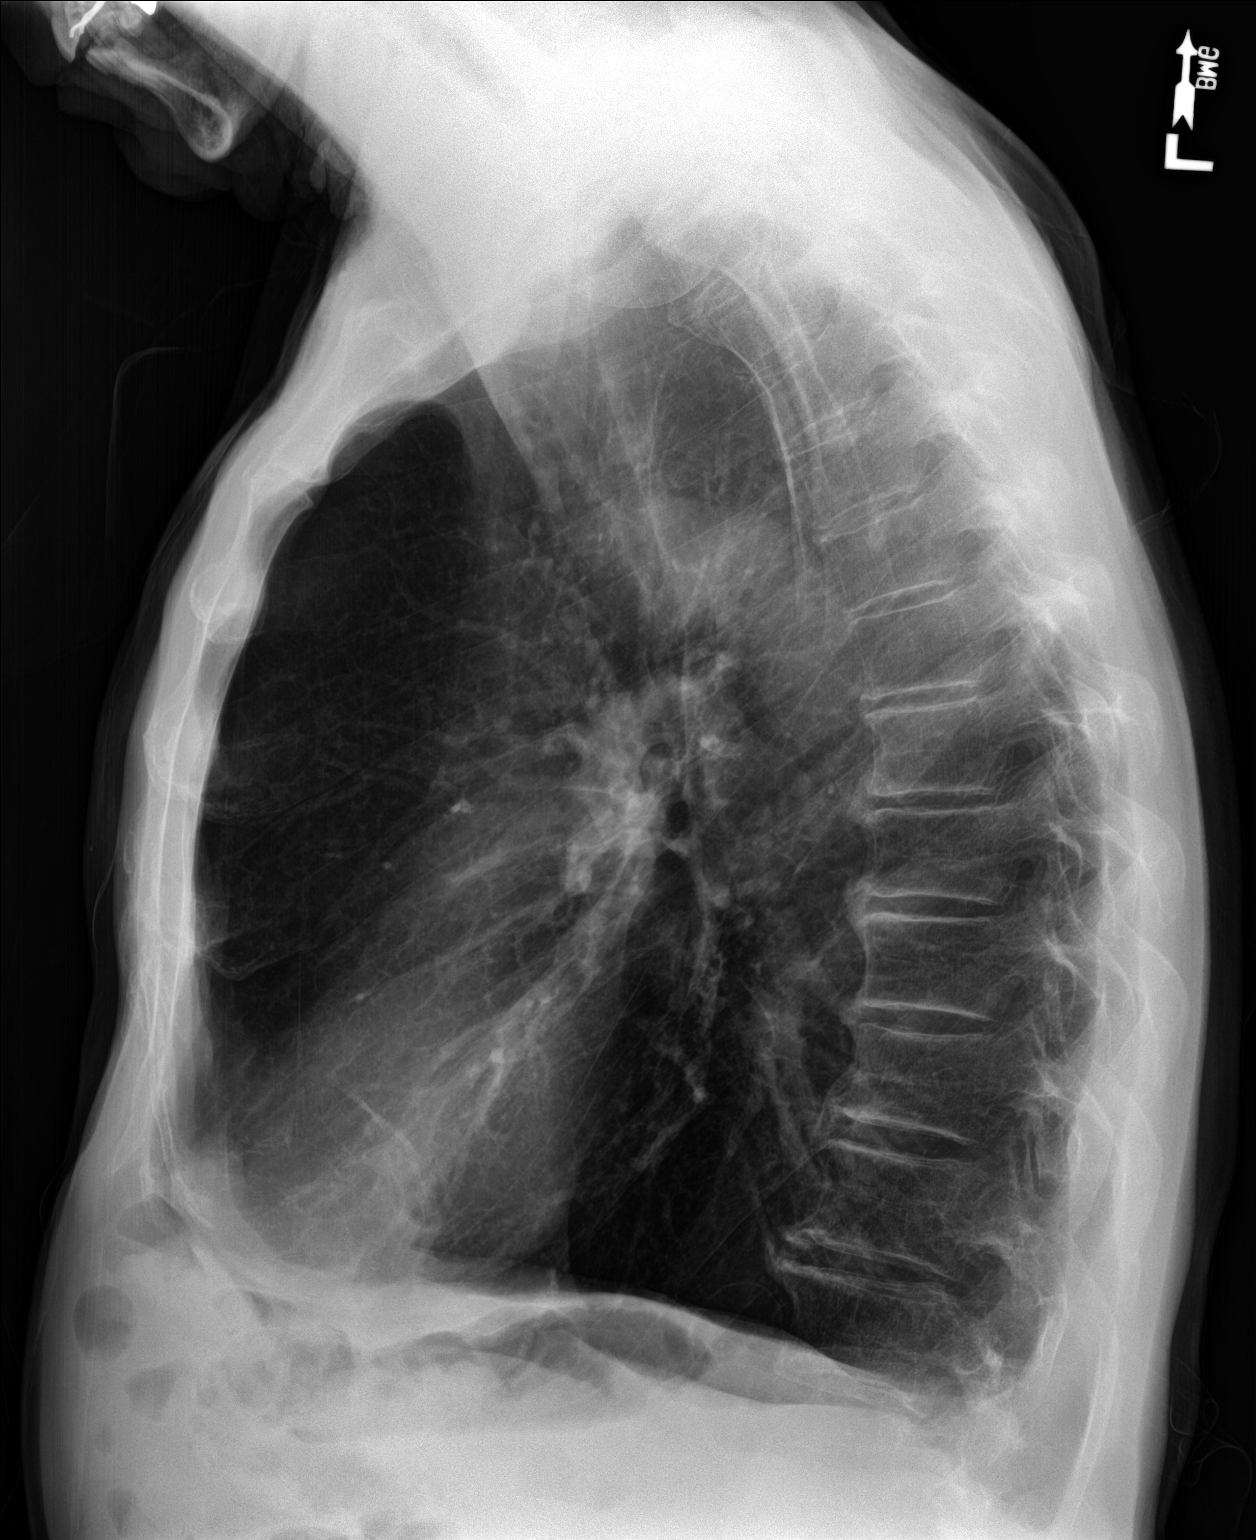

[chest pa (2 of 2)]
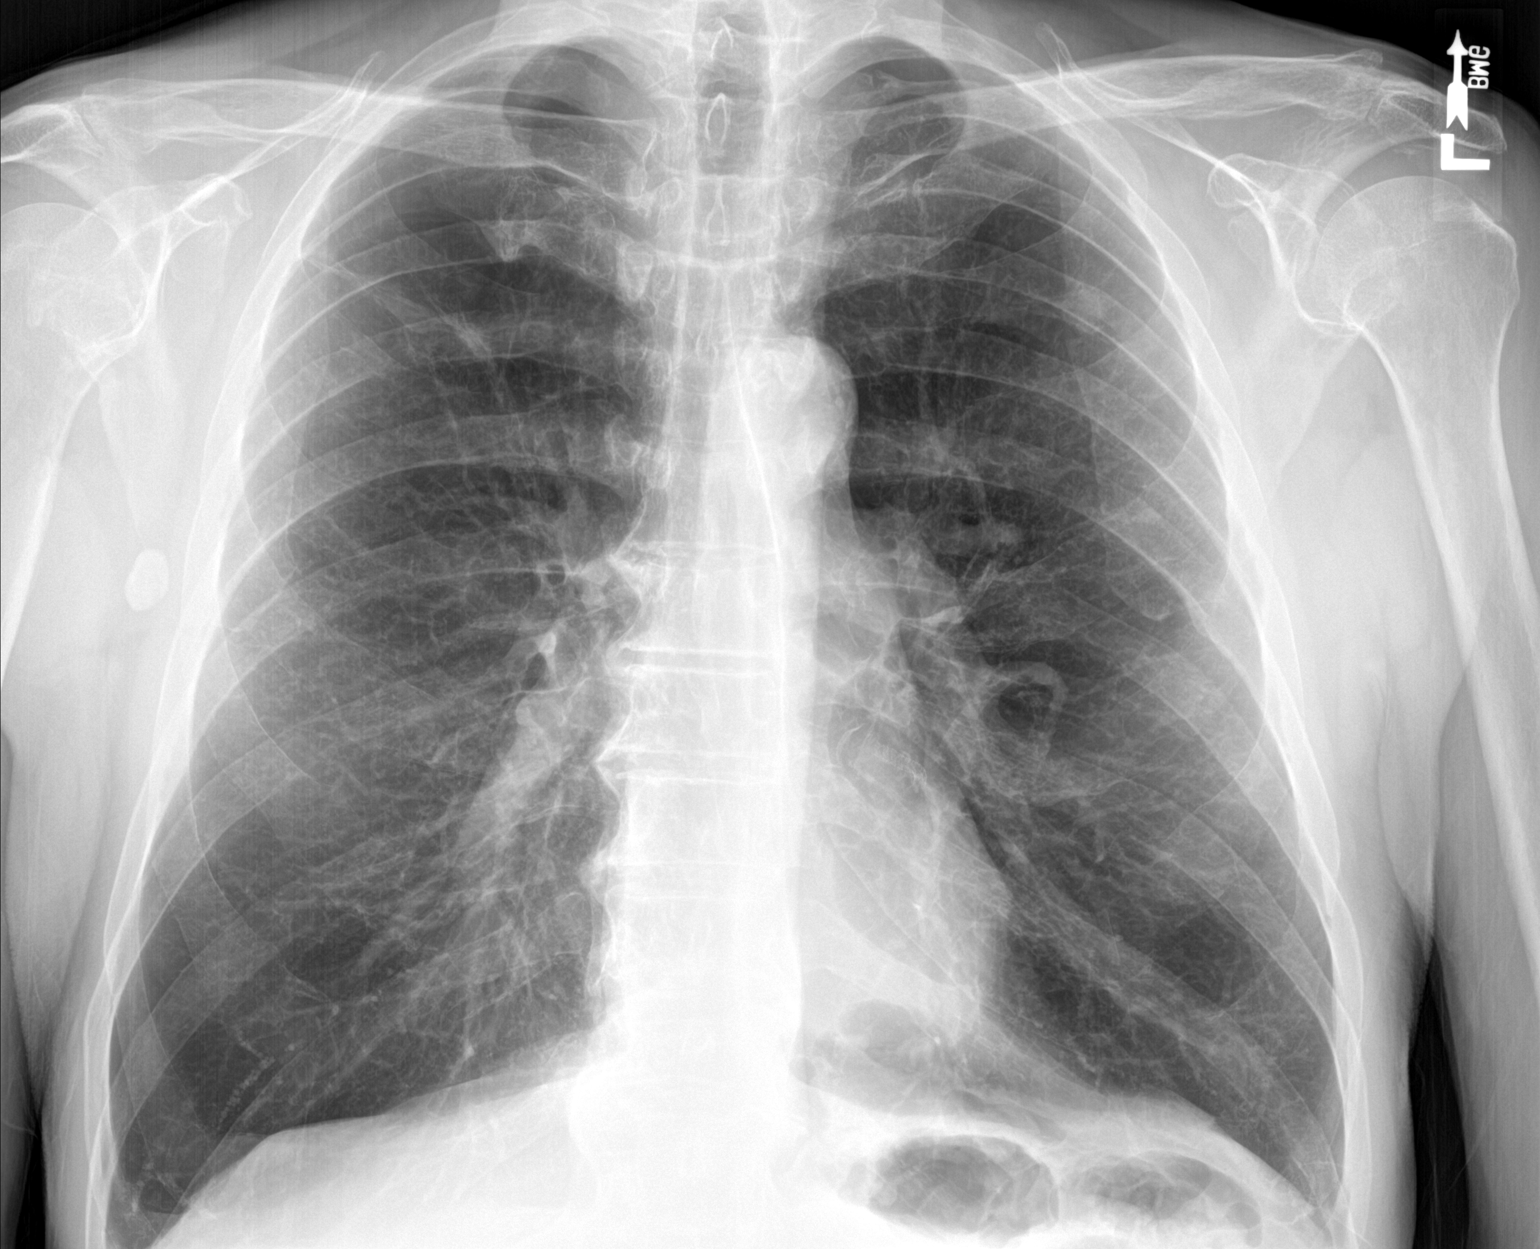

[3 of 3 positions shown; findings below may reference images not displayed]

FINDINGS: The heart size and mediastinal contours are within normal limits. No
pneumothorax or pleural effusion is noted. Hyperexpansion of the
lungs is noted. Emphysematous disease is noted in the upper lobes.
Stable left upper lobe nodule is noted compared to prior exam.. The
visualized skeletal structures are unremarkable.
IMPRESSION: No active cardiopulmonary disease. Hyperexpansion of the lungs.
Stable left upper lobe nodule.

Emphysema ([GX]-[GX]).

## 2018-05-22 MED ORDER — ALBUTEROL SULFATE (2.5 MG/3ML) 0.083% IN NEBU
5.0000 mg | INHALATION_SOLUTION | Freq: Once | RESPIRATORY_TRACT | Status: AC
  Administered 2018-05-22: 5 mg via RESPIRATORY_TRACT

## 2018-05-22 MED ORDER — METHYLPREDNISOLONE SODIUM SUCC 125 MG IJ SOLR
125.0000 mg | Freq: Once | INTRAMUSCULAR | Status: AC
  Administered 2018-05-22: 125 mg via INTRAMUSCULAR

## 2018-05-22 NOTE — Patient Instructions (Signed)
     IF you received an x-ray today, you will receive an invoice from [facility]. Please contact [facility] at [phone] with questions or concerns regarding your invoice.   IF you received labwork today, you will receive an invoice from [facility]. Please contact [facility] at [phone] with questions or concerns regarding your invoice.   Our billing staff will not be able to assist you with questions regarding bills from these companies.  You will be contacted with the lab results as soon as they are available. The fastest way to get your results is to activate your My Chart account. Instructions are located on the last page of this paperwork. If you have not heard from us regarding the results in 2 weeks, please contact this office.     

## 2018-05-22 NOTE — Progress Notes (Signed)
Subjective:    Patient ID: Leslie Sims, male    DOB: 02/19/1951, 67 y.o.   MRN: [id number]  05/22/2018  Depression (follow-up )    HPI This 67 y.o. male presents for evaluation of COPD exacerbation.  Called last week for DOE/SOB, wheezing; called in Doxycycline and Prednisone.  Started Doxycycline; did not start Prednisone.  No fever/chills/sweats.  Mild headache.  No ear pain; +ST constant since two days ago.  Feels like closing up.  Some rhinorrhea but mild; some nasal congestion; medium color and thick really.  Coughing a lot; onset several nights ago. +sputum production for two days.  +hypoxia started two days ago; lowest is 84%.  Using Robitussin.  Using nebulizer every 5-6 hours; last neb 12:00pm; using one albuterol.  Got in shower.  Lowest oxygen level at home mid 80s.    Sleeping with oxygen every night; 2 liters [place].  During the day, has oxygen on 1 lpm.  This morning, pulse oximetry 88% on oxygen. Not sure of trigger.  Was laying on ground last week working on [employer] car.    Started Wellbutrin by Dr. Nicolasa Ducking; was not urinating or having regular bowel movements.  Starting to feel better with Wellbutrin.  Recent increase in Wellbutrin to one full tablet.  Nervousness is persistent; sadness is better.  Granddaughter is home from college for the summer; one year left.    R hip pain: intermittent pain in R hip.  No pain with walking; pain with laying on that hip.   BP Readings from Last 3 Encounters:  07/07/18 112/68  07/03/18 (!) 92/53  06/01/18 102/62   Wt Readings from Last 3 Encounters:  07/07/18 137 lb (62.1 kg)  07/02/18 137 lb 9.6 oz (62.4 kg)  06/01/18 137 lb (62.1 kg)   Immunization History  Administered Date(s) Administered  . Influenza Whole 09/19/2012  . Influenza,inj,Quad PF,6+ Mos 10/24/2013, 11/25/2014, 03/02/2016, 10/27/2016, 10/05/2017  . Pneumococcal Conjugate-13 11/25/2014  . Pneumococcal Polysaccharide-23 08/24/2016, 10/27/2016  .  Pneumococcal-Unspecified 12/20/2008  . Tdap 12/20/2008    Review of Systems  Constitutional: Negative for activity change, appetite change, chills, diaphoresis, fatigue, fever and unexpected weight change.  HENT: Positive for congestion, postnasal drip and rhinorrhea. Negative for dental problem, drooling, ear discharge, ear pain, facial swelling, hearing loss, mouth sores, nosebleeds, sinus pressure, sneezing, sore throat, tinnitus, trouble swallowing and voice change.   Eyes: Negative for photophobia, pain, discharge, redness, itching and visual disturbance.  Respiratory: Positive for cough, shortness of breath and wheezing. Negative for apnea, choking, chest tightness and stridor.   Cardiovascular: Negative for chest pain, palpitations and leg swelling.  Gastrointestinal: Negative for abdominal pain, blood in stool, constipation, diarrhea, nausea and vomiting.  Endocrine: Negative for cold intolerance, heat intolerance, polydipsia, polyphagia and polyuria.  Genitourinary: Negative for decreased urine volume, difficulty urinating, discharge, dysuria, enuresis, flank pain, frequency, genital sores, hematuria, penile pain, penile swelling, scrotal swelling, testicular pain and urgency.  Musculoskeletal: Negative for arthralgias, back pain, gait problem, joint swelling, myalgias, neck pain and neck stiffness.  Skin: Negative for color change, pallor, rash and wound.  Allergic/Immunologic: Negative for environmental allergies, food allergies and immunocompromised state.  Neurological: Negative for dizziness, tremors, seizures, syncope, facial asymmetry, speech difficulty, weakness, light-headedness, numbness and headaches.  Hematological: Negative for adenopathy. Does not bruise/bleed easily.  Psychiatric/Behavioral: Positive for dysphoric mood. Negative for agitation, behavioral problems, confusion, decreased concentration, hallucinations, self-injury, sleep disturbance and suicidal ideas. The  patient is nervous/anxious. The patient is not hyperactive.  Past Medical History:  Diagnosis Date  . Allergic rhinitis   . Anxiety   . Back pain   . Colon polyps 08/2012   colonoscopy; multiple colon polyps; repeat colonoscopy in one year.  [name] COPD (chronic obstructive pulmonary disease) ([place])   . Coronary artery disease 11/2009   a. late presenting ant MI; b. LHC 100% pLAD s/p PCI/DES, 99% mRCA s/p PCI/DES, EF 35%; c. nuclear stress test 05/13: prior ant/inf infarcts w/o ischemia, EF 43%; d. LHC 02/14: widely patent stents with no other obs dz, EF 40%; e. LHC 11/17: LM nl, mLAD 10%, patent LAD stent, dLAD 20%, p-mRCA 10%, mRCA 40%, patent RCA stent, EF 35-45%   . Depression   . GERD (gastroesophageal reflux disease)   . Headache(784.0)   . Helicobacter pylori (H. pylori)   . Hemorrhoids   . Hernia    inguinal  . Hyperlipidemia   . Hypertension   . Insomnia   . Ischemic cardiomyopathy   . Myalgia   . Peptic ulcer   . ST elevation (STEMI) myocardial infarction involving left anterior descending coronary artery ([place]) 11/2009   a. s/p PCI to the LAD  . Status post dilation of esophageal narrowing 2000  . Tinea pedis   . Wears dentures    partial upper   Past Surgical History:  Procedure Laterality Date  . Admission  12/20/2012   COPD exacerbation.  [place].  [name] CARDIAC CATHETERIZATION    . CARDIAC CATHETERIZATION  02/05/13   [facility]  . CARDIAC CATHETERIZATION  10/14   [facility] : patent stents with no change in anatomy. EF: 40$  . CARDIAC CATHETERIZATION Left 11/12/2016   Procedure: Left Heart Cath and Coronary Angiography;  Surgeon: Wellington Hampshire, MD;  Location: [facility];  Service: Cardiovascular;  Laterality: Left;  . COLONOSCOPY    . COLONOSCOPY WITH PROPOFOL N/A 05/18/2016   Procedure: COLONOSCOPY WITH PROPOFOL;  Surgeon: Lucilla Lame, MD;  Location: [facility] ENDOSCOPY;  Service: Endoscopy;  Laterality: N/A;  . CORONARY ANGIOPLASTY WITH STENT PLACEMENT  09/2009   LAD 3.0  X23 mm Xience DES, RCA: 4.0 X 15 mm Xience DES  . ELECTROMAGNETIC NAVIGATION BROCHOSCOPY N/A 10/27/2017   Procedure: ELECTROMAGNETIC NAVIGATION BRONCHOSCOPY;  Surgeon: Flora Lipps, MD;  Location: [facility] ORS;  Service: Cardiopulmonary;  Laterality: N/A;  . ESOPHAGEAL DILATION    . ESOPHAGOGASTRODUODENOSCOPY  2008  . ESOPHAGOGASTRODUODENOSCOPY  08/20/2012  . HERNIA REPAIR  07/20/2012   L inguinal hernia repair  . PENILE PROSTHESIS IMPLANT     Allergies  Allergen Reactions  . Prozac [Fluoxetine Hcl] Shortness Of Breath  . Effexor Xr [Venlafaxine Hcl Er]   . Wellbutrin [Bupropion] Other (See Comments)    Unknown/"made me feel real funny"   Current Outpatient Medications on File Prior to Visit  Medication Sig Dispense Refill  . albuterol (VENTOLIN HFA) 108 (90 Base) MCG/ACT inhaler USE 2 INHALATIONS EVERY 6 HOURS AS NEEDED FOR WHEEZING 48 g 3  . aspirin 81 MG tablet Take 81 mg by mouth daily.      [name] atorvastatin (LIPITOR) 40 MG tablet Take 1 tablet (40 mg total) by mouth daily. 90 tablet 3  . Fluticasone-Salmeterol (ADVAIR DISKUS) 500-50 MCG/DOSE AEPB Inhale 1 puff into the lungs 2 (two) times daily. 180 each 3  . losartan (COZAAR) 25 MG tablet TAKE 1 TABLET DAILY 90 tablet 3  . Multiple Vitamin (MULTIVITAMIN) capsule Take 1 capsule by mouth daily.    . nitroGLYCERIN (NITROSTAT) 0.4 MG SL tablet Place 1 tablet (  0.4 mg total) under the tongue every 5 (five) minutes as needed. 25 tablet 3  . sucralfate (CARAFATE) 1 GM/10ML suspension Take 10 mLs (1 g total) by mouth 4 (four) times daily -  with meals and at bedtime. (Patient taking differently: Take 1 g by mouth 4 (four) times daily as needed. ) 3600 mL 3  . tiotropium (SPIRIVA HANDIHALER) 18 MCG inhalation capsule Place 1 capsule (18 mcg total) into inhaler and inhale daily. 90 capsule 3   No current facility-administered medications on file prior to visit.    Social History   Socioeconomic History  . Marital status: Widowed    Spouse name:  Not on file  . Number of children: 5  . Years of education: Not on file  . Highest education level: Not on file  Occupational History  . 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 Financial resource strain: Not on file  . Food insecurity:    Worry: Not on file    Inability: Not on file  . Transportation needs:    Medical: Not on file    Non-medical: Not on file  Tobacco Use  . Smoking status: Current Every Day Smoker    Packs/day: 0.50    Years: 42.00    Pack years: 21.00    Types: Cigarettes  . Smokeless tobacco: Never Used  . Tobacco comment: 02/15/18 For tobacco cessation he is getting better.  He is staying between 6-18 cigarettes a day versus a full pack.  He is also set up to start a one on one smoking cessation class since the current classes were already in progress and did not restart un  Substance and Sexual Activity  . Alcohol use: No    Alcohol/week: 0.0 oz  . Drug use: No  . Sexual activity: Yes  Lifestyle  . Physical activity:    Days per week: Not on file    Minutes per session: Not on file  . Stress: Not on file  Relationships  . Social connections:    Talks on phone: Not on file    Gets together: Not on file    Attends religious service: Not on file    Active member of club or organization: Not on file    Attends meetings of clubs or organizations: Not on file    Relationship status: Not on file  . Intimate partner violence:    Fear of current or ex partner: Not on file    Emotionally abused: Not on file    Physically abused: Not on file    Forced sexual activity: Not on file  Other Topics Concern  . Not on file  Social History Narrative   Marital status: married x 32 years; happily married.      Children: 4 children;  1 stepchild and 15 grandchildren; no great grandchildren.      Lives: with wife; 2 dogs, 1 cat.      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      Tobacco: 1 ppd x 45 years        Alcohol: none; previous alcoholism      Drugs: none      Exercise: no formal exercise; physically demanding job; owns horses.      Seatbelts:  100%      Guns: one loaded unsecured gun in the home.      Sunscreen: none   Family History  Problem Relation Age of Onset  .  Heart attack Brother        Brother #1  . Diabetes Brother   . Hypertension Brother        #3  . Coronary artery disease Father 7       deceased  . Heart attack Father   . Diabetes Father   . Heart disease Father   . COPD Mother 85       deceased  . Alcohol abuse Sister        polysubstance abuse  . COPD Sister   . Lung cancer Sister   . Alcohol abuse Sister        polysubstance abuse  . Penile cancer Brother   . Diabetes Brother        Objective:    BP 102/68   Pulse 90   Temp 98 F (36.7 C) (Oral)   Resp 16   Ht 5' 8.9" (1.75 m)   Wt 138 lb (62.6 kg)   SpO2 (!) 86%   BMI 20.44 kg/m  Physical Exam  Constitutional: He is oriented to person, place, and time. He appears well-developed and well-nourished. No distress.  HENT:  Head: Normocephalic and atraumatic.  Right Ear: External ear normal.  Left Ear: External ear normal.  Nose: Nose normal.  Mouth/Throat: Oropharynx is clear and moist.  Eyes: Pupils are equal, round, and reactive to light. Conjunctivae and EOM are normal.  Neck: Normal range of motion. Neck supple. Carotid bruit is not present. No thyromegaly present.  Cardiovascular: Normal rate, regular rhythm, normal heart sounds and intact distal pulses. Exam reveals no gallop and no friction rub.  No murmur heard. Pulmonary/Chest: Effort normal. No stridor. No respiratory distress. He has wheezes. He has no rales. He exhibits no tenderness.  Abdominal: Soft. Bowel sounds are normal. He exhibits no distension and no mass. There is no tenderness. There is no rebound and no guarding.  Lymphadenopathy:    He has no cervical adenopathy.  Neurological: He is alert and oriented to  person, place, and time. He displays normal reflexes. No cranial nerve deficit or sensory deficit. He exhibits normal muscle tone. Coordination normal.  Skin: Skin is warm and dry. No rash noted. He is not diaphoretic.  Psychiatric: He has a normal mood and affect. His behavior is normal. Judgment and thought content normal.  Nursing note and vitals reviewed.  No results found. Depression screen [facility] 2/9 07/07/2018 05/22/2018 04/19/2018 03/03/2018 01/24/2018  Decreased Interest 2 2 3 3 3   Down, Depressed, Hopeless 2 2 3 3 3   PHQ - 2 Score 4 4 6 6 6   Altered sleeping 2 1 3 3 3   Tired, decreased energy 2 1 3 3 3   Change in appetite 2 1 3 1 3   Feeling bad or failure about yourself  2 1 3 1 1   Trouble concentrating 0 0 1 1 3   Moving slowly or fidgety/restless 0 0 0 1 3  Suicidal thoughts 0 0 0 1 0  PHQ-9 Score 12 8 19 17 22   Difficult doing work/chores - - - - Extremely dIfficult  Some recent data might be hidden   Fall Risk  07/07/2018 05/22/2018 04/19/2018 03/03/2018 01/24/2018  Falls in the past year? No No No No No        Assessment & Plan:   1. COPD with hypoxia ([place])   2. Atherosclerosis of native coronary artery of native heart without angina pectoris   3. Hyperglycemia   4. DEPRESSION/ANXIETY    COPD exacerbation with hypoxia:  New; obtain CXR; s/p Methylprednisolone IM in office with [facility].  Clinically improved; pulse oximetry improved to 97% on room air.  Increase nebulizer to 5mg  every four hours.  Complete Prednisone and Doxycycline therapy.  To ED for acute decline.  Continue home oxygen supplementation.  CAD: stable; EKG stable; asymptomatic.    Orders Placed This Encounter  Procedures  . DG Chest 2 View    Standing Status:   Future    Number of Occurrences:   1    Standing Expiration Date:   05/22/2019    Order Specific Question:   Reason for Exam (SYMPTOM  OR DIAGNOSIS REQUIRED)    Answer:   hypoxia, COPD exacerbation    Order Specific Question:   Preferred imaging location?     Answer:   External  . CBC with Differential/Platelet  . Comprehensive metabolic panel  . Hemoglobin A1c  . EKG 12-Lead   Meds ordered this encounter  Medications  . methylPREDNISolone sodium succinate (SOLU-MEDROL) 125 mg/2 mL injection 125 mg  . albuterol (PROVENTIL) (2.5 MG/3ML) 0.083% nebulizer solution 5 mg    No follow-ups on file.   Sophi[facility] [place] Elayne Guerin, M.D. Primary Care at [facility] [address] [place], [place]  [address] [phone] phone [phone] fax

## 2018-05-22 NOTE — Progress Notes (Signed)
Pulmonary Individual Treatment Plan  Patient Details  Name: Leslie Sims MRN: [id number] Date of Birth: 09-Feb-1951 Referring Provider:     Pulmonary Rehab from 01/24/2018 in [facility] Cardiac and Pulmonary Rehab  Referring Provider  [name]      Initial Encounter Date:    Pulmonary Rehab from 01/24/2018 in [facility] Cardiac and Pulmonary Rehab  Date  01/24/18  Referring Provider  [name]      Visit Diagnosis: Chronic obstructive pulmonary disease, unspecified COPD type ([place])  Patient's Home Medications on Admission:  Current Outpatient Medications:  .  albuterol (VENTOLIN HFA) 108 (90 Base) MCG/ACT inhaler, USE 2 INHALATIONS EVERY 6 HOURS AS NEEDED FOR WHEEZING (Patient not taking: Reported on 05/03/2018), Disp: 48 g, Rfl: 3 .  aspirin 81 MG tablet, Take 81 mg by mouth daily.  , Disp: , Rfl:  .  atorvastatin (LIPITOR) 40 MG tablet, Take 1 tablet (40 mg total) by mouth daily., Disp: 90 tablet, Rfl: 3 .  doxycycline (VIBRA-TABS) 100 MG tablet, Take 1 tablet (100 mg total) by mouth 2 (two) times daily., Disp: 20 tablet, Rfl: 0 .  FLUoxetine (PROZAC) 20 MG tablet, Take 1 tablet (20 mg total) by mouth daily., Disp: 30 tablet, Rfl: 3 .  Fluticasone-Salmeterol (ADVAIR DISKUS) 500-50 MCG/DOSE AEPB, Inhale 1 puff into the lungs 2 (two) times daily., Disp: 180 each, Rfl: 3 .  furosemide (LASIX) 20 MG tablet, Take 1 tablet (20 mg total) by mouth daily., Disp: 30 tablet, Rfl: 0 .  ipratropium-albuterol (DUONEB) 0.5-2.5 (3) MG/3ML SOLN, Take 3 mLs by nebulization every 6 (six) hours as needed. (Patient not taking: Reported on 05/03/2018), Disp: 225 mL, Rfl: 3 .  ketoconazole (NIZORAL) 2 % cream, Apply 1 application topically 2 (two) times daily. (Patient not taking: Reported on 05/03/2018), Disp: 60 g, Rfl: 0 .  LORazepam (ATIVAN) 1 MG tablet, TAKE 1/2 TABLET BY MOUTH EVERY 6 HOURS AS NEEDED FOR ANXIETY, Disp: 60 tablet, Rfl: 2 .  losartan (COZAAR) 25 MG tablet, TAKE 1 TABLET DAILY, Disp: 90 tablet, Rfl:  3 .  mirtazapine (REMERON) 15 MG tablet, Take 1 tablet (15 mg total) by mouth at bedtime., Disp: 30 tablet, Rfl: 5 .  Multiple Vitamin (MULTIVITAMIN) capsule, Take 1 capsule by mouth daily., Disp: , Rfl:  .  nitroGLYCERIN (NITROSTAT) 0.4 MG SL tablet, Place 1 tablet (0.4 mg total) under the tongue every 5 (five) minutes as needed. (Patient not taking: Reported on 05/03/2018), Disp: 25 tablet, Rfl: 3 .  predniSONE (DELTASONE) 20 MG tablet, Take 1 tablet (20 mg total) by mouth daily with breakfast., Disp: 15 tablet, Rfl: 0 .  sucralfate (CARAFATE) 1 GM/10ML suspension, Take 10 mLs (1 g total) by mouth 4 (four) times daily -  with meals and at bedtime., Disp: 3600 mL, Rfl: 3 .  tiotropium (SPIRIVA HANDIHALER) 18 MCG inhalation capsule, Place 1 capsule (18 mcg total) into inhaler and inhale daily. (Patient not taking: Reported on 05/03/2018), Disp: 90 capsule, Rfl: 3  Past Medical History: Past Medical History:  Diagnosis Date  . Allergic rhinitis   . Anxiety   . Atherosclerosis   . Back pain   . Bronchitis    09-14-11  . CARDIOMYOPATHY 03/24/2010   did not tolerate Toprol and Lisinopril.   . Chest pain   . CHF (congestive heart failure) ([place])   . Chronic airway obstruction, not elsewhere classified   . Colon polyps 08/2012   colonoscopy; multiple colon polyps; repeat colonoscopy in one year.  [name] COPD (chronic obstructive pulmonary  disease) ([place])   . Coronary artery disease 11/2009   Anterior MI with late presentation 11/2009. Cath showed a 100% proximal LAD occlusion, 99% mid RCA. Had a PCI and 2 DES to both LAD and RCA. EF was 35%. Nuclear stress test 05/13: prior anterior/inferior infarcts without ischemic, EF 43%, cardiac cath in 02/14: Widely patent stents with no other obstructive disease. EF 40%   . Depression   . GERD (gastroesophageal reflux disease)   . Headache(784.0)   . Helicobacter pylori (H. pylori)   . Hemorrhoids   . Hernia    inguinal  . Hyperlipidemia   . Hypertension   .  Insomnia   . MI (myocardial infarction) ([place])   . Myalgia   . Peptic ulcer   . Shortness of breath dyspnea   . Status post dilation of esophageal narrowing 2000  . Thyroid dysfunction   . Tinea pedis   . Wears dentures    partial upper    Tobacco Use: Social History   Tobacco Use  Smoking Status Current Every Day Smoker  . Packs/day: 0.50  . Years: 42.00  . Pack years: 21.00  . Types: Cigarettes  Smokeless Tobacco Never Used  Tobacco Comment   02/15/18 For tobacco cessation he is getting better.  He is staying between 6-18 cigarettes a day versus a full pack.  He is also set up to start a one on one smoking cessation class since the current classes were already in progress and did not restart un    Labs: Recent Review Flowsheet Data    Labs for ITP Cardiac and Pulmonary Rehab Latest Ref Rng & Units 03/01/2017 06/17/2017 07/27/2017 12/23/2017 12/25/2017   Cholestrol 100 - 199 mg/dL 112 - 143 - -   LDLCALC 0 - 99 mg/dL 47 - 67 - -   HDL >39 mg/dL 33(L) - 62 - -   Trlycerides 0 - 149 mg/dL 158(H) - 72 - -   Hemoglobin A1c 4.8 - 5.6 % 5.3 5.8(H) - - -   HCO3 20.0 - 28.0 mmol/L - - - 37.2(H) 41.4(H)   O2SAT % - - - 87.4 81.6       Pulmonary Assessment Scores: Pulmonary Assessment Scores    Row Name 01/24/18 1247         ADL UCSD   ADL Phase  Entry Leslie Entry numbers 01/24/2018     SOB Score total  86     Rest  2     Walk  4     Stairs  5     Bath  3     Dress  4     Shop  5       CAT Score   CAT Score  28       mMRC Score   mMRC Score  1        Pulmonary Function Assessment: Pulmonary Function Assessment - 01/24/18 1246      Pulmonary Function Tests   FVC%  72 % date performed 11/01/17    FEV1%  27 %    FEV1/FVC Ratio  30      Breath   Shortness of Breath  Yes;Limiting activity;Fear of Shortness of Breath       Exercise Target Goals:    Exercise Program Goal: Individual exercise prescription set using results from initial 6 min walk test and THRR while  considering  patient's activity barriers and safety.    Exercise Prescription Goal: Initial exercise prescription builds to 30-45 minutes  a day of aerobic activity, 2-3 days per week.  Home exercise guidelines will be given to patient during program as part of exercise prescription that the participant will acknowledge.  Activity Barriers & Risk Stratification:   6 Minute Walk: 6 Minute Walk    Row Name 01/24/18 1258         6 Minute Walk   Distance  1000 feet     Walk Time  4.75 minutes     # of Rest Breaks  1     MPH  2.4     METS  3.4     RPE  11     Perceived Dyspnea   2     VO2 Peak  11.9     Symptoms  No     Resting HR  93 bpm     Resting BP  106/72     Resting Oxygen Saturation   95 %     Exercise Oxygen Saturation  during 6 min walk  80 %     Max Ex. HR  123 bpm     Max Ex. BP  132/64     2 Minute Post BP  108/64       Interval HR   1 Minute HR  106     2 Minute HR  104     3 Minute HR  102     4 Minute HR  123     6 Minute HR  120     2 Minute Post HR  97     Interval Heart Rate?  Yes       Interval Oxygen   Interval Oxygen?  Yes     Baseline Oxygen Saturation %  95 %     1 Minute Oxygen Saturation %  82 %     1 Minute Liters of Oxygen  2 L     2 Minute Oxygen Saturation %  82 %     2 Minute Liters of Oxygen  2 L     3 Minute Oxygen Saturation %  81 %     3 Minute Liters of Oxygen  2 L     4 Minute Oxygen Saturation %  83 %     4 Minute Liters of Oxygen  2 L     5 Minute Oxygen Saturation %  81 %     5 Minute Liters of Oxygen  2 L     6 Minute Oxygen Saturation %  80 %     6 Minute Liters of Oxygen  2 L     2 Minute Post Oxygen Saturation %  95 %     2 Minute Post Liters of Oxygen  2 L       Oxygen Initial Assessment: Oxygen Initial Assessment - 01/24/18 1220      Home Oxygen   Home Oxygen Device  Home Concentrator;E-Tanks    Sleep Oxygen Prescription  Continuous    Liters per minute  2    Home Exercise Oxygen Prescription  Continuous     Liters per minute  2 UNtil sees his pulmonary Doctor again.  This is post discharge form January 2019    Home at Rest Exercise Oxygen Prescription  Continuous    Liters per minute  2    Compliance with Home Oxygen Use  Yes      Initial 6 min Walk   Oxygen Used  Continuous;E-Tanks    Liters per minute  2  Program Oxygen Prescription   Program Oxygen Prescription  Continuous;E-Tanks    Liters per minute  2      Intervention   Short Term Goals  To learn and exhibit compliance with exercise, home and travel O2 prescription;To learn and understand importance of monitoring SPO2 with pulse oximeter and demonstrate accurate use of the pulse oximeter.;To learn and understand importance of maintaining oxygen saturations>88%;To learn and demonstrate proper use of respiratory medications;To learn and demonstrate proper pursed lip breathing techniques or other breathing techniques.    Long  Term Goals  Exhibits compliance with exercise, home and travel O2 prescription;Verbalizes importance of monitoring SPO2 with pulse oximeter and return demonstration;Maintenance of O2 saturations>88%;Exhibits proper breathing techniques, such as pursed lip breathing or other method taught during program session;Compliance with respiratory medication;Demonstrates proper use of MDI's       Oxygen Re-Evaluation: Oxygen Re-Evaluation    Row Name 02/03/18 1136 03/03/18 1155           Program Oxygen Prescription   Program Oxygen Prescription  Continuous;E-Tanks  Continuous;E-Tanks      Liters per minute  2  2        Home Oxygen   Home Oxygen Device  Home Concentrator;E-Tanks  Home Concentrator;E-Tanks      Sleep Oxygen Prescription  Continuous  Continuous      Liters per minute  2  2      Home Exercise Oxygen Prescription  Continuous  Continuous      Liters per minute  2  2      Home at Rest Exercise Oxygen Prescription  Continuous  Continuous      Liters per minute  2  2      Compliance with Home Oxygen  Use  Yes  Yes        Goals/Expected Outcomes   Short Term Goals  To learn and exhibit compliance with exercise, home and travel O2 prescription;To learn and understand importance of monitoring SPO2 with pulse oximeter and demonstrate accurate use of the pulse oximeter.;To learn and understand importance of maintaining oxygen saturations>88%;To learn and demonstrate proper use of respiratory medications;To learn and demonstrate proper pursed lip breathing techniques or other breathing techniques.  To learn and exhibit compliance with exercise, home and travel O2 prescription;To learn and understand importance of monitoring SPO2 with pulse oximeter and demonstrate accurate use of the pulse oximeter.;To learn and understand importance of maintaining oxygen saturations>88%;To learn and demonstrate proper use of respiratory medications;To learn and demonstrate proper pursed lip breathing techniques or other breathing techniques.      Long  Term Goals  Exhibits compliance with exercise, home and travel O2 prescription;Verbalizes importance of monitoring SPO2 with pulse oximeter and return demonstration;Maintenance of O2 saturations>88%;Exhibits proper breathing techniques, such as pursed lip breathing or other method taught during program session;Compliance with respiratory medication;Demonstrates proper use of MDI's  Exhibits compliance with exercise, home and travel O2 prescription;Verbalizes importance of monitoring SPO2 with pulse oximeter and return demonstration;Maintenance of O2 saturations>88%;Exhibits proper breathing techniques, such as pursed lip breathing or other method taught during program session;Compliance with respiratory medication;Demonstrates proper use of MDI's      Comments  Reviewed PLB technique with pt.  Talked about how it work and it's important to maintaining his exercise saturations.    Leslie Sims has a pulse oximeter at home and understands that while exercising his oxygen should be  above 88 percent. He is taking his respiratory medications regularly. He sates that he is more short of breath  in the morning when he wakes up. He wears 2 liters at night when he sleeps. Sometimes when he uses humidity he wakes up with a stuffy nose and when he does not use it he wakes up to dry. Informed him to check his concentrator and make sure the filter is clean.      Goals/Expected Outcomes  Short: Become more profiecient at using PLB.   Long: Become independent at using PLB.  Short: check his home cencentrator filter. Long: maintain proper use of concentrator         Oxygen Discharge (Final Oxygen Re-Evaluation): Oxygen Re-Evaluation - 03/03/18 1155      Program Oxygen Prescription   Program Oxygen Prescription  Continuous;E-Tanks    Liters per minute  2      Home Oxygen   Home Oxygen Device  Home Concentrator;E-Tanks    Sleep Oxygen Prescription  Continuous    Liters per minute  2    Home Exercise Oxygen Prescription  Continuous    Liters per minute  2    Home at Rest Exercise Oxygen Prescription  Continuous    Liters per minute  2    Compliance with Home Oxygen Use  Yes      Goals/Expected Outcomes   Short Term Goals  To learn and exhibit compliance with exercise, home and travel O2 prescription;To learn and understand importance of monitoring SPO2 with pulse oximeter and demonstrate accurate use of the pulse oximeter.;To learn and understand importance of maintaining oxygen saturations>88%;To learn and demonstrate proper use of respiratory medications;To learn and demonstrate proper pursed lip breathing techniques or other breathing techniques.    Long  Term Goals  Exhibits compliance with exercise, home and travel O2 prescription;Verbalizes importance of monitoring SPO2 with pulse oximeter and return demonstration;Maintenance of O2 saturations>88%;Exhibits proper breathing techniques, such as pursed lip breathing or other method taught during program session;Compliance with  respiratory medication;Demonstrates proper use of MDI's    Comments  Leslie Sims has a pulse oximeter at home and understands that while exercising his oxygen should be above 88 percent. He is taking his respiratory medications regularly. He sates that he is more short of breath in the morning when he wakes up. He wears 2 liters at night when he sleeps. Sometimes when he uses humidity he wakes up with a stuffy nose and when he does not use it he wakes up to dry. Informed him to check his concentrator and make sure the filter is clean.    Goals/Expected Outcomes  Short: check his home cencentrator filter. Long: maintain proper use of concentrator       Initial Exercise Prescription: Initial Exercise Prescription - 01/24/18 1300      Date of Initial Exercise RX and Referring Provider   Date  01/24/18    Referring Provider  [name]      Oxygen   Oxygen  Continuous    Liters  2      Treadmill   MPH  2.4    Grade  1.4    Minutes  15    METs  3.3      Recumbant Bike   Level  1    RPM  60    Watts  25    Minutes  15    METs  3.3      T5 Nustep   Level  2    SPM  80    Minutes  15    METs  3.3      Prescription  Details   Frequency (times per week)  3    Duration  Progress to 45 minutes of aerobic exercise without signs/symptoms of physical distress      Intensity   THRR 40-80% of Max Heartrate  118-143    Ratings of Perceived Exertion  11-13    Perceived Dyspnea  0-4      Resistance Training   Training Prescription  Yes    Weight  3 lb    Reps  10-15       Perform Capillary Blood Glucose checks as needed.  Exercise Prescription Changes: Exercise Prescription Changes    Row Name 02/14/18 1000 02/15/18 1500 02/28/18 1400 03/15/18 1400 03/27/18 1600     Response to Exercise   Blood Pressure (Admit)  142/80  -  124/70  108/58  122/70   Blood Pressure (Exercise)  126/74  -  -  -  -   Blood Pressure (Exit)  100/58  -  106/58  124/60  110/60   Heart Rate (Admit)  115 bpm  -   98 bpm  92 bpm  98 bpm   Heart Rate (Exercise)  130 bpm  -  121 bpm  102 bpm  109 bpm   Heart Rate (Exit)  101 bpm  -  102 bpm  96 bpm  92 bpm   Oxygen Saturation (Admit)  99 %  -  96 %  100 %  98 %   Oxygen Saturation (Exercise)  89 %  -  94 %  94 %  94 %   Oxygen Saturation (Exit)  98 %  -  95 %  95 %  94 %   Rating of Perceived Exertion (Exercise)  12  -  12  10  10    Perceived Dyspnea (Exercise)  2  -  1  1  2    Symptoms  none  -  none  none  none   Duration  Continue with 45 min of aerobic exercise without signs/symptoms of physical distress.  -  Continue with 45 min of aerobic exercise without signs/symptoms of physical distress.  Continue with 45 min of aerobic exercise without signs/symptoms of physical distress.  Continue with 45 min of aerobic exercise without signs/symptoms of physical distress.   Intensity  THRR unchanged  -  THRR unchanged  THRR unchanged  THRR unchanged     Progression   Progression  Continue to progress workloads to maintain intensity without signs/symptoms of physical distress.  -  Continue to progress workloads to maintain intensity without signs/symptoms of physical distress.  Continue to progress workloads to maintain intensity without signs/symptoms of physical distress.  Continue to progress workloads to maintain intensity without signs/symptoms of physical distress.   Average METs  2.58  -  2.82  3.04  2.9     Resistance Training   Training Prescription  Yes  -  Yes  Yes  Yes   Weight  4 lbs  -  5 lbs  5 lbs  5 lbs   Reps  10-15  -  10-15  10-15  10-15     Interval Training   Interval Training  No  -  No  No  No     Oxygen   Oxygen  Continuous  -  Continuous  Continuous  Continuous   Liters  2  -  2 3L as needed   2  2     Treadmill   MPH  2.4  -  2.5  2.5  2.5   Grade  1  -  0  0.5  0   Minutes  15  -  15  15  15    METs  3.17  -  2.91  3.09  2.91     Recumbant Bike   Level  2  -  4  4  5    RPM  70  -  64  85  59   Watts  22  -  29  32  34    Minutes  15  -  15  15  15    METs  3.21  -  3.84  3.84  3.84     T5 Nustep   Level  3  -  4  5  4    SPM  86  -  82  81  89   Minutes  15  -  15  15  15    METs  2  -  2.1  2.2  2.1     Home Exercise Plan   Plans to continue exercise at  -  Home (comment) walking, ellipitical  Home (comment) walking, ellipitical  Home (comment) walking, ellipitical  Home (comment) walking, ellipitical   Frequency  -  Add 2 additional days to program exercise sessions.  Add 2 additional days to program exercise sessions.  Add 2 additional days to program exercise sessions.  Add 2 additional days to program exercise sessions.   Initial Home Exercises Provided  -  02/15/18  02/15/18  02/15/18  02/15/18      Exercise Comments: Exercise Comments    Row Name 02/03/18 1135           Exercise Comments  First full day of exercise!  Patient was oriented to gym and equipment including functions, settings, policies, and procedures.  Patient's individual exercise prescription and treatment plan were reviewed.  All starting workloads were established based on the results of the 6 minute walk test done at initial orientation visit.  The plan for exercise progression was also introduced and progression will be customized based on patient's performance and goals.          Exercise Goals and Review: Exercise Goals    Row Name 01/24/18 1257             Exercise Goals   Increase Physical Activity  Yes       Intervention  Provide advice, education, support and counseling about physical activity/exercise needs.;Develop an individualized exercise prescription for aerobic and resistive training based on initial evaluation findings, risk stratification, comorbidities and participant's personal goals.       Expected Outcomes  Short Term: Attend rehab on a regular basis to increase amount of physical activity.;Long Term: Add in home exercise to make exercise part of routine and to increase amount of physical  activity.;Long Term: Exercising regularly at least 3-5 days a week.       Increase Strength and Stamina  Yes       Intervention  Provide advice, education, support and counseling about physical activity/exercise needs.;Develop an individualized exercise prescription for aerobic and resistive training based on initial evaluation findings, risk stratification, comorbidities and participant's personal goals.       Expected Outcomes  Short Term: Increase workloads from initial exercise prescription for resistance, speed, and METs.;Short Term: Perform resistance training exercises routinely during rehab and add in resistance training at home;Long Term: Improve cardiorespiratory fitness, muscular endurance and strength as measured by increased METs and functional  capacity (6MWT)       Able to understand and use rate of perceived exertion (RPE) scale  Yes       Intervention  Provide education and explanation on how to use RPE scale       Expected Outcomes  Short Term: Able to use RPE daily in rehab to express subjective intensity level;Long Term:  Able to use RPE to guide intensity level when exercising independently       Able to understand and use Dyspnea scale  Yes       Intervention  Provide education and explanation on how to use Dyspnea scale       Expected Outcomes  Short Term: Able to use Dyspnea scale daily in rehab to express subjective sense of shortness of breath during exertion;Long Term: Able to use Dyspnea scale to guide intensity level when exercising independently       Knowledge and understanding of Target Heart Rate Range (THRR)  Yes       Intervention  Provide education and explanation of THRR including how the numbers were predicted and where they are located for reference       Expected Outcomes  Short Term: Able to state/look up THRR;Long Term: Able to use THRR to govern intensity when exercising independently;Short Term: Able to use daily as guideline for intensity in rehab       Able  to check pulse independently  Yes       Intervention  Provide education and demonstration on how to check pulse in carotid and radial arteries.;Review the importance of being able to check your own pulse for safety during independent exercise       Expected Outcomes  Short Term: Able to explain why pulse checking is important during independent exercise;Long Term: Able to check pulse independently and accurately       Understanding of Exercise Prescription  Yes       Intervention  Provide education, explanation, and written materials on patient's individual exercise prescription       Expected Outcomes  Short Term: Able to explain program exercise prescription;Long Term: Able to explain home exercise prescription to exercise independently          Exercise Goals Re-Evaluation : Exercise Goals Re-Evaluation    Row Name 02/03/18 1135 02/14/18 1034 02/15/18 1513 02/28/18 1456 03/15/18 1447     Exercise Goal Re-Evaluation   Exercise Goals Review  Understanding of Exercise Prescription;Knowledge and understanding of Target Heart Rate Range (THRR);Able to understand and use rate of perceived exertion (RPE) scale;Able to understand and use Dyspnea scale  Increase Physical Activity;Understanding of Exercise Prescription;Increase Strength and Stamina  Increase Physical Activity;Understanding of Exercise Prescription;Increase Strength and Stamina;Able to understand and use Dyspnea scale;Knowledge and understanding of Target Heart Rate Range (THRR);Able to understand and use rate of perceived exertion (RPE) scale;Able to check pulse independently  Increase Physical Activity;Understanding of Exercise Prescription;Increase Strength and Stamina  Increase Physical Activity;Understanding of Exercise Prescription;Increase Strength and Stamina   Comments  Reviewed RPE scale, THR and program prescription with pt today.  Pt voiced understanding and was given a copy of goals to take home.   Vickie is off to a good start  in rehab.  He is already up to level 2 on the recumbent bike and level 3 on the T5 NuStep.  We will continue to monitor his progression.   Reviewed home exercise with pt today.  Pt plans to walk and eventually use elliptical at home for exercise.  Reviewed THR, pulse, RPE, sign and symptoms, NTG use, and when to call 911 or MD.  Also discussed weather considerations and indoor options.  Pt voiced understanding.  Leslie Sims has been doing well in rehab.  He is now up to level 4 on the T5 NuStep and the recumbent bike.  He seems to be enjoying the exercise more this go around.  We will continue to monitor his progression.   Leslie Sims continues to do well in rehab. He is now using 5 lbs handweights and moved up to level 5 on the T5 NuStep. He has also added 0.5% grade to his treadmill. We will continue to monitor his progression.   Expected Outcomes  Short: Use RPE daily to regulate intensity.  Long: Follow program prescription in [facility].  Short: Continue to attend classes regularly.  Long: Continue to work on [employer].   Short: Add in at least one extra day of walking at home.  Long: Continue to build strength and stamina.   Short: Continue to increase walking here and at home.  Long: Continue to exercise on off days.   Short: Add more incline to treadmill.  Long: Continue to build strength and stamina.    [place] Name 03/27/18 1604 04/12/18 1456 04/25/18 1602 05/09/18 1501       Exercise Goal Re-Evaluation   Exercise Goals Review  Increase Physical Activity;Understanding of Exercise Prescription;Increase Strength and Stamina  -  -  -    Comments  Micky has been doing well in rehab.  He is now up to 2.5 mph on the treadmill.  We will continue to moniotr his progression.   Out since last review  Out since last review  Out since last review.  Planning to return tomorrow 5/22 or Friday 5/24    Expected Outcomes  Short: Add incline back to treadmill.  Long: Continue to work on exercising at home.   -  -   -       Discharge Exercise Prescription (Final Exercise Prescription Changes): Exercise Prescription Changes - 03/27/18 1600      Response to Exercise   Blood Pressure (Admit)  122/70    Blood Pressure (Exit)  110/60    Heart Rate (Admit)  98 bpm    Heart Rate (Exercise)  109 bpm    Heart Rate (Exit)  92 bpm    Oxygen Saturation (Admit)  98 %    Oxygen Saturation (Exercise)  94 %    Oxygen Saturation (Exit)  94 %    Rating of Perceived Exertion (Exercise)  10    Perceived Dyspnea (Exercise)  2    Symptoms  none    Duration  Continue with 45 min of aerobic exercise without signs/symptoms of physical distress.    Intensity  THRR unchanged      Progression   Progression  Continue to progress workloads to maintain intensity without signs/symptoms of physical distress.    Average METs  2.9      Resistance Training   Training Prescription  Yes    Weight  5 lbs    Reps  10-15      Interval Training   Interval Training  No      Oxygen   Oxygen  Continuous    Liters  2      Treadmill   MPH  2.5    Grade  0    Minutes  15    METs  2.91      Recumbant Bike  Level  5    RPM  59    Watts  34    Minutes  15    METs  3.84      T5 Nustep   Level  4    SPM  89    Minutes  15    METs  2.1      Home Exercise Plan   Plans to continue exercise at  Home (comment) walking, ellipitical    Frequency  Add 2 additional days to program exercise sessions.    Initial Home Exercises Provided  02/15/18       Nutrition:  Target Goals: Understanding of nutrition guidelines, daily intake of sodium <1513m, cholesterol <2033m calories 30% from fat and 7% or less from saturated fats, daily to have 5 or more servings of fruits and vegetables.  Biometrics: Pre Biometrics - 01/24/18 1257      Pre Biometrics   Height  5' 9.5" (1.765 m)    Weight  141 lb 12.8 oz (64.3 kg)    Waist Circumference  35 inches    Hip Circumference  37.25 inches    Waist to Hip Ratio  0.94 %    BMI  (Calculated)  20.65        Nutrition Therapy Plan and Nutrition Goals: Nutrition Therapy & Goals - 03/15/18 1227      Nutrition Therapy   Diet  TLC diabetic but does not follow diabetic diet, follows a low sodium diet    Drug/Food Interactions  Statins/Certain Fruits    Protein (specify units)  12-13oz    Fiber  30 grams    Whole Grain Foods  3 servings    Saturated Fats  16 max. grams    Fruits and Vegetables  2 servings/day does not regularly consume them; 8 servings ideal     Sodium  1500 grams      Personal Nutrition Goals   Nutrition Goal  Increase intake of fruits and vegetables to at least 1 serving per day    Personal Goal #2  Do not skip meals! Eat on a regular schedule and consume snacks between meals when possible. Focus on protein-rich meals and snacks    Personal Goal #3  Consume Premier Protein shakes more consistently, at least 1/day ideally    Comments  patient reports recent wt loss of 10# and is struggling to maintain weight. consumes seafood, pork chops, beans, eggs, dairy and peanut butter for protein      Intervention Plan   Intervention  Prescribe, educate and counsel regarding individualized specific dietary modifications aiming towards targeted core components such as weight, hypertension, lipid management, diabetes, heart failure and other comorbidities.;Nutrition handout(s) given to patient. Nutition therapy for COPD handout provided    Expected Outcomes  Short Term Goal: Understand basic principles of dietary content, such as calories, fat, sodium, cholesterol and nutrients.;Short Term Goal: A plan has been developed with personal nutrition goals set during dietitian appointment.;Long Term Goal: Adherence to prescribed nutrition plan.       Nutrition Assessments: Nutrition Assessments - 01/24/18 1224      MEDFICTS Scores   Pre Score  38       Nutrition Goals Re-Evaluation: Nutrition Goals Re-Evaluation    Row Name 02/15/18 1525 03/03/18 1205 03/15/18  1231 03/15/18 1232       Goals   Current Weight  139 lb (63 kg)  144 lb (65.3 kg)  -  -    Nutrition Goal  Meet with dietician to  gain weight  Meet with dietician to gain weight, eat healthier  Increase intake of fruits and vegetables, ideally working up to consuming each food group at least daily  Do not skip meals; eat on a regular schedule and utilize [employer] Protein shakes as needed for extra protein and calories    Comment  Leslie Sims would like to gain weight.  He wants to learn to eat right but still gain some weight.  He has an appointment scheduled to meet with dietician next week.  Leslie Sims meets with the dietician next week. He wants to gain a little bit of weight. Informed him that his diet is important to his breathing. Patient verbalizes understanding.  He reports intake of fruits and vegetables to be low at this time  Patient reports difficulty maintaining weight, recent wt loss of 10# per his report, decreased appetite    Expected Outcome  Short: Meet with dietician.  Long: Continue to follow recommendations.   Short: Meet with dietician.  Long: Continue to follow recommendations of dietician.   Patient will consume both fruits and vegetables daily  Patient will maintain CBW or gain weight        Nutrition Goals Discharge (Final Nutrition Goals Re-Evaluation): Nutrition Goals Re-Evaluation - 03/15/18 1232      Goals   Nutrition Goal  Do not skip meals; eat on a regular schedule and utilize Premier Protein shakes as needed for extra protein and calories    Comment  Patient reports difficulty maintaining weight, recent wt loss of 10# per his report, decreased appetite    Expected Outcome  Patient will maintain CBW or gain weight        Psychosocial: Target Goals: Acknowledge presence or absence of significant depression and/or stress, maximize coping skills, provide positive support system. Participant is able to verbalize types and ability to use techniques and skills needed for  reducing stress and depression.   Initial Review & Psychosocial Screening: Initial Psych Review & Screening - 01/24/18 1251      Initial Review   Comments  Leslie Sims states that he tries to get out of the house, away from home,at least 30 min every day.   He does have  a CAT Scan due soon for followup of nodules found earlier in his chest.  Biopsy did indicate not cancer.       Family Dynamics   Good Support System?  -- Dandra has his 2 dogs at home that are great companions to him       Quality of Life Scores:  Scores of 19 and below usually indicate a poorer quality of life in these areas.  A difference of  2-3 points is a clinically meaningful difference.  A difference of 2-3 points in the total score of the Quality of Life Index has been associated with significant improvement in overall quality of life, self-image, physical symptoms, and general health in studies assessing change in quality of life.  PHQ-9: Recent Review Flowsheet Data    Depression screen [facility] 2/9 04/19/2018 03/03/2018 01/24/2018 01/09/2018 11/16/2017   Decreased Interest 3 3 3 3 2    Down, Depressed, Hopeless 3 3 3 3 2    PHQ - 2 Score 6 6 6 6 4    Altered sleeping 3 3 3 3 1    Tired, decreased energy 3 3 3 3 1    Change in appetite 3 1 3 3 2    Feeling bad or failure about yourself  3 1 1 2 1    Trouble concentrating 1  1 3 2  0   Moving slowly or fidgety/restless 0 1 3 0 0   Suicidal thoughts 0 1 0 0 0   PHQ-9 Score 19 17 22 19 9    Difficult doing work/chores - - Extremely dIfficult - -     Interpretation of Total Score  Total Score Depression Severity:  1-4 = Minimal depression, 5-9 = Mild depression, 10-14 = Moderate depression, 15-19 = Moderately severe depression, 20-27 = Severe depression   Psychosocial Evaluation and Intervention: Psychosocial Evaluation - 02/08/18 1219      Psychosocial Evaluation & Interventions   Comments  Leslie Sims) has returned to this Pulmonary Rehab program following a  hospitalization recently for an upper lung infection.  He is a 67 year old who suffers with multiple health issues including COPD.  Eliel has a strong support system with his daughter close by and his local church family.  He sadly lost his primary support spouse this past July and is grieving that loss currently.  [place] reports chronic sleeping problems and has lost his appetite recently.  He has a history of depression and anxiety and reports these have both worsened with the loss of his spouse, brother and sister in the past several years.  He is on medication to help with this and reports it is working "somewhat."  Kaleo is attending Grief share support groups with his daughter and states this is helping as well.  Leslie Sims's PHQ-9 is "22" indicating severe depression currently.  Counselor discussed this with him and encouraged a medication evaluation if this doesn't improve significantly in the next few weeks.  Tracer has goals to breathe better; be off the oxygen during the day and improve his health and be able to return to more normal activities.  He will be followed by staff and this counselor throughout the course of this program.      Expected Outcomes  Tamarion will begin to exercise more consistently to help with his goals to breathe better and also to help with his depression/anxiety symptoms.  The educational and psychoeducational components of this program will help him understand and cope more positively with his condition. Mareo will have a medication evaluation to address his mood symptoms in the near future.      Continue Psychosocial Services   Follow up required by counselor       Psychosocial Re-Evaluation: Psychosocial Re-Evaluation    Leslie Sims Name 03/13/18 1224             Psychosocial Re-Evaluation   Current issues with  Current Sleep Concerns;Current Psychotropic Meds;Current Anxiety/Panic;Current Depression       Comments  Counselor follow up with Leslie Sims today.  He was  tearful and reported "not sure what's going on."  He denies HI/SI at this time.  He states his anxiety has increased as he is afraid of going too far from home; afraid of increased shortness of breath; and is afraid of not being able to breathe when he takes a shower - opens window and shower curtain.  His medications for anxiety and depression have recently been changed and he continues to struggle.  Reakwon is attending grief-share for his complicated grief subsequent to the loss of his spouse 9 months ago.  He reports not sleeping well and no interest in things he used to enjoy.  Tian is meeting with a therapist tomorrow for his initial intake appointment.  He is being followed on his medications by his PCP.  This counselor suggested a psychiatrist  since he has had so many negative reactions to psychotropic medications.  Counselor provided a name and number for a local psychiatrist.  Leslie Sims states he feels better once he works out.  He also thinks part of the problem yesterday was he forgot his nicotene patch in the morning.  Counselor will continue to provide support and Leslie Sims will meet with his counselor; call his daughter for support; and contact a psychiatrist for medication evaluation.         Expected Outcomes  [place] will contact a psychiatrist; meet with a Leslie therapist tomorrow; and call his Daughter for support during this time.  He also will continue to attend the Grief support group.  LT - to continue to work on smoking cessation and exercise for improved breathing and health.       Interventions  Relaxation education;Physician referral;Encouraged to attend Pulmonary Rehabilitation for the exercise       Continue Psychosocial Services   Follow up required by counselor          Psychosocial Discharge (Final Psychosocial Re-Evaluation): Psychosocial Re-Evaluation - 03/13/18 1224      Psychosocial Re-Evaluation   Current issues with  Current Sleep Concerns;Current Psychotropic  Meds;Current Anxiety/Panic;Current Depression    Comments  Counselor follow up with Leslie Sims today.  He was tearful and reported "not sure what's going on."  He denies HI/SI at this time.  He states his anxiety has increased as he is afraid of going too far from home; afraid of increased shortness of breath; and is afraid of not being able to breathe when he takes a shower - opens window and shower curtain.  His medications for anxiety and depression have recently been changed and he continues to struggle.  Leslie Sims is attending grief-share for his complicated grief subsequent to the loss of his spouse 9 months ago.  He reports not sleeping well and no interest in things he used to enjoy.  [place] is meeting with a therapist tomorrow for his initial intake appointment.  He is being followed on his medications by his PCP.  This counselor suggested a psychiatrist since he has had so many negative reactions to psychotropic medications.  Counselor provided a name and number for a local psychiatrist.  Cochise states he feels better once he works out.  He also thinks part of the problem yesterday was he forgot his nicotene patch in the morning.  Counselor will continue to provide support and Abishai will meet with his counselor; call his daughter for support; and contact a psychiatrist for medication evaluation.      Expected Outcomes  [place] will contact a psychiatrist; meet with a Leslie therapist tomorrow; and call his Daughter for support during this time.  He also will continue to attend the Grief support group.  LT - to continue to work on smoking cessation and exercise for improved breathing and health.    Interventions  Relaxation education;Physician referral;Encouraged to attend Pulmonary Rehabilitation for the exercise    Continue Psychosocial Services   Follow up required by counselor       Education: Education Goals: Education classes will be provided on a weekly basis, covering required topics.  Participant will state understanding/return demonstration of topics presented.  Learning Barriers/Preferences: Learning Barriers/Preferences - 01/24/18 1237      Learning Barriers/Preferences   Learning Barriers  None    Learning Preferences  Verbal Instruction;Video       Education Topics:  Initial Evaluation Education: - Verbal, written and  demonstration of respiratory meds, oximetry and breathing techniques. Instruction on use of nebulizers and MDIs and importance of monitoring MDI activations.   Pulmonary Rehab from 03/31/2018 in [facility]  Date  01/24/18  Educator  SB  Instruction Review Code  1- Verbalizes Understanding      General Nutrition Guidelines/Fats and Fiber: -Group instruction provided by verbal, written material, models and posters to present the general guidelines for heart healthy nutrition. Gives an explanation and review of dietary fats and fiber.   Pulmonary Rehab from 03/31/2018 in [facility] Cardiac and Pulmonary Rehab  Date  03/13/18  Educator  CR  Instruction Review Code  1- Verbalizes Understanding      Controlling Sodium/Reading Food Labels: -Group verbal and written material supporting the discussion of sodium use in heart healthy nutrition. Review and explanation with models, verbal and written materials for utilization of the food label.   Pulmonary Rehab from 03/31/2018 in [facility] Cardiac and Pulmonary Rehab  Date  03/20/18  Educator  CR  Instruction Review Code  1- Verbalizes Understanding      Exercise Physiology & General Exercise Guidelines: - Group verbal and written instruction with models to review the exercise physiology of the cardiovascular system and associated critical values. Provides general exercise guidelines with specific guidelines to those with heart or lung disease.    Aerobic Exercise & Resistance Training: - Gives group verbal and written instruction on the various components of exercise. Focuses on aerobic  and resistive training programs and the benefits of this training and how to safely progress through these programs.   Flexibility, Balance, Mind/Body Relaxation: Provides group verbal/written instruction on the benefits of flexibility and balance training, including mind/body exercise modes such as yoga, pilates and tai chi.  Demonstration and skill practice provided.   Stress and Anxiety: - Provides group verbal and written instruction about the health risks of elevated stress and causes of high stress.  Discuss the correlation between heart/lung disease and anxiety and treatment options. Review healthy ways to manage with stress and anxiety.   Pulmonary Rehab from 03/31/2018 in [facility] and Pulmonary Rehab  Date  02/22/18  Educator  [facility]  Instruction Review Code  1- Verbalizes Understanding      Depression: - Provides group verbal and written instruction on the correlation between heart/lung disease and depressed mood, treatment options, and the stigmas associated with seeking treatment.   Pulmonary Rehab from 03/31/2018 in [facility] Cardiac and Pulmonary Rehab  Date  02/08/18  Educator  [facility]  Instruction Review Code  1- Verbalizes Understanding      Exercise & Equipment Safety: - Individual verbal instruction and demonstration of equipment use and safety with use of the equipment.   Pulmonary Rehab from 03/31/2018 in [facility] Cardiac and Pulmonary Rehab  Date  01/24/18  Educator  Sb  Instruction Review Code  1- Verbalizes Understanding      Infection Prevention: - Provides verbal and written material to individual with discussion of infection control including proper hand washing and proper equipment cleaning during exercise session.   Pulmonary Rehab from 03/31/2018 in [facility] Cardiac and Pulmonary Rehab  Date  01/24/18  Educator  Sb  Instruction Review Code  1- Verbalizes Understanding      Falls Prevention: - Provides verbal and written material to individual with discussion of  falls prevention and safety.   Pulmonary Rehab from 03/31/2018 in [facility] Cardiac and Pulmonary Rehab  Date  01/24/18  Educator  SB  Instruction Review Code  1-  Verbalizes Understanding      Diabetes: - Individual verbal and written instruction to review signs/symptoms of diabetes, desired ranges of glucose level fasting, after meals and with exercise. Advice that pre and post exercise glucose checks will be done for 3 sessions at entry of program.   Chronic Lung Diseases: - Group verbal and written instruction to review updates, respiratory medications, advancements in procedures and treatments. Discuss use of supplemental oxygen including available portable oxygen systems, continuous and intermittent flow rates, concentrators, personal use and safety guidelines. Review proper use of inhaler and spacers. Provide informative websites for self-education.    Pulmonary Rehab from 03/31/2018 in [facility]  Date  03/15/18  Educator  [facility]  Instruction Review Code  1- Verbalizes Understanding      Energy Conservation: - Provide group verbal and written instruction for methods to conserve energy, plan and organize activities. Instruct on pacing techniques, use of adaptive equipment and posture/positioning to relieve shortness of breath.   Pulmonary Rehab from 03/31/2018 in [facility] Cardiac and Pulmonary Rehab  Date  02/15/18  Educator  [facility]  Instruction Review Code  1- Verbalizes Understanding      Triggers and Exacerbations: - Group verbal and written instruction to review types of environmental triggers and ways to prevent exacerbations. Discuss weather changes, air quality and the benefits of nasal washing. Review warning signs and symptoms to help prevent infections. Discuss techniques for effective airway clearance, coughing, and vibrations.   AED/CPR: - Group verbal and written instruction with the use of models to demonstrate the basic use of the AED with the basic ABC's of  resuscitation.   Pulmonary Rehab from 03/31/2018 in [facility] Cardiac and Pulmonary Rehab  Date  02/17/18  Educator  [facility]  Instruction Review Code  1- Actuary and Physiology of the Lungs: - Group verbal and written instruction with the use of models to provide basic lung anatomy and physiology related to function, structure and complications of lung disease.   Pulmonary Rehab from 03/31/2018 in [facility] Cardiac and Pulmonary Rehab  Date  03/01/18  Educator  [facility]  Instruction Review Code  1- Verbalizes Understanding      Anatomy & Physiology of the Heart: - Group verbal and written instruction and models provide basic cardiac anatomy and physiology, with the coronary electrical and arterial systems. Review of Valvular disease and Heart Failure   Cardiac Medications: - Group verbal and written instruction to review commonly prescribed medications for heart disease. Reviews the medication, class of the drug, and side effects.   Pulmonary Rehab from 03/31/2018 in [facility] Card[facility] and Pulmonary Rehab  Date  02/03/18  Educator  [facility]  Instruction Review Code  1- Verbalizes Understanding      Know Your Numbers and Risk Factors: -Group verbal and written instruction about important numbers in your health.  Discussion of what are risk factors and how they play a role in the disease process.  Review of Cholesterol, Blood Pressure, Diabetes, and BMI and the role they play in your overall health.   Pulmonary Rehab from 03/31/2018 in [facility] and [facility]monary Re[facility]  Date  03/31/18  Educator  [facility]  Instruction Review Code  1- Verbalizes Understanding      Sleep Hygiene: -Provides group verbal and written instruction about how sleep can affect your health.  Define sleep hygiene, discuss sleep cycles and impact of sleep habits. Review good sleep hygiene tips.    Other: -Provides group and verbal instruction  on various topics (see comments)    Knowledge Questionnaire  Score: Knowledge Questionnaire Score - 01/24/18 1237      Knowledge Questionnaire Score   Pre Score  15/18 Reviewed correct responses with Leslie Sims. He verbalized understading of the correct response. He had no questions today        Core Components/Risk Factors/Patient Goals at Admission: Personal Goals and Risk Factors at Admission - 01/24/18 1226      Core Components/Risk Factors/Patient Goals on Admission    Weight Management  Yes    Intervention  Weight Management: Develop a combined nutrition and exercise program designed to reach desired caloric intake, while maintaining appropriate intake of nutrient and fiber, sodium and fats, and appropriate energy expenditure required for the weight goal.;Weight Management: Provide education and appropriate resources to help participant work on and attain dietary goals.;Weight Management/Obesity: Establish reasonable short term and long term weight goals.    Admit Weight  141 lb 12.8 oz (64.3 kg)    Goal Weight: Short Term  145 lb (65.8 kg)    Goal Weight: Long Term  145 lb (65.8 kg)    Expected Outcomes  Short Term: Continue to assess and modify interventions until short term weight is achieved;Long Term: Adherence to nutrition and physical activity/exercise program aimed toward attainment of established weight goal;Weight Maintenance: Understanding of the daily nutrition guidelines, which includes 25-35% calories from fat, 7% or less cal from saturated fats, less than 22m cholesterol, less than 1.5gm of sodium, & 5 or more servings of fruits and vegetables daily;Understanding recommendations for meals to include 15-35% energy as protein, 25-35% energy from fat, 35-60% energy from carbohydrates, less than 2035mof dietary cholesterol, 20-35 gm of total fiber daily;Understanding of distribution of calorie intake throughout the day with the consumption of 4-5 meals/snacks    Tobacco Cessation  Yes    Intervention  Assist the participant in steps to  quit. Provide individualized education and counseling about committing to Tobacco Cessation, relapse prevention, and pharmacological support that can be provided by physician.;OfAdvice workerassist with locating and accessing local/national Quit Smoking programs, and support quit date choice. Given smoking cessation packet. Reviewed need to see the counselor to help him with the smoking cessation journey.    Expected Outcomes  Short Term: Will demonstrate readiness to quit, by selecting a quit date.;Long Term: Complete abstinence from all tobacco products for at least 12 months from quit date.;Short Term: Will quit all tobacco product use, adhering to prevention of relapse plan.    Improve shortness of breath with ADL's  Yes    Intervention  Provide education, individualized exercise plan and daily activity instruction to help decrease symptoms of SOB with activities of daily living.    Expected Outcomes  Short Term: Improve cardiorespiratory fitness to achieve a reduction of symptoms when performing ADLs;Long Term: Be able to perform more ADLs without symptoms or delay the onset of symptoms    Heart Failure  Yes    Intervention  Provide a combined exercise and nutrition program that is supplemented with education, support and counseling about heart failure. Directed toward relieving symptoms such as shortness of breath, decreased exercise tolerance, and extremity edema.    Expected Outcomes  Improve functional capacity of life;Short term: Attendance in program 2-3 days a week with increased exercise capacity. Reported lower sodium intake. Reported increased fruit and vegetable intake. Reports medication compliance.;Short term: Daily weights obtained and reported for increase. Utilizing diuretic protocols set by physician.;Long term: Adoption of self-care skills  and reduction of barriers for early signs and symptoms recognition and intervention leading to self-care maintenance.     Hypertension  Yes    Intervention  Provide education on lifestyle modifcations including regular physical activity/exercise, weight management, moderate sodium restriction and increased consumption of fresh fruit, vegetables, and low fat dairy, alcohol moderation, and smoking cessation.;Monitor prescription use compliance.    Expected Outcomes  Short Term: Continued assessment and intervention until BP is < 140/4m HG in hypertensive participants. < 130/815mHG in hypertensive participants with diabetes, heart failure or chronic kidney disease.;Long Term: Maintenance of blood pressure at goal levels.    Lipids  Yes    Intervention  Provide education and support for participant on nutrition & aerobic/resistive exercise along with prescribed medications to achieve LDL <7046mHDL >48m39m  Expected Outcomes  Short Term: Participant states understanding of desired cholesterol values and is compliant with medications prescribed. Participant is following exercise prescription and nutrition guidelines.;Long Term: Cholesterol controlled with medications as prescribed, with individualized exercise RX and with personalized nutrition plan. Value goals: LDL < 70mg44mL > 40 mg.    Stress  Yes    Intervention  Offer individual and/or small group education and counseling on adjustment to heart disease, stress management and health-related lifestyle change. Teach and support self-help strategies.;Refer participants experiencing significant psychosocial distress to appropriate mental health specialists for further evaluation and treatment. When possible, include family members and significant others in education/counseling sessions. Loss of three family members in the past year. One being his wife of 37 ye42s.  Continues to grieve for his wife.Is attending grief counseling program.     Expected Outcomes  Short Term: Participant demonstrates changes in health-related behavior, relaxation and other stress management skills,  ability to obtain effective social support, and compliance with psychotropic medications if prescribed.;Long Term: Emotional wellbeing is indicated by absence of clinically significant psychosocial distress or social isolation.       Core Components/Risk Factors/Patient Goals Review:  Goals and Risk Factor Review    Row Name 02/15/18 1514 03/03/18 1151           Core Components/Risk Factors/Patient Goals Review   Personal Goals Review  Weight Management/Obesity;Heart Failure;Hypertension;Tobacco Cessation;Lipids;Improve shortness of breath with ADL's  Weight Management/Obesity;Heart Failure;Hypertension;Tobacco Cessation;Lipids;Improve shortness of breath with ADL's      Review  JohnnShahilbeen doing well in rehab.  His weight has been holding steady but he would like to gain weight.  His goal is to get back up to 145 lbs but he wants to build up muscle.  He has an appointment with the dietician during class next week to help with weight gain.  He is doing a little better with his shortness of breath but there are some days that it is worse than others.   He continues to work on his heart failure and has not had any symptoms.  He monitors his blood pressures at home at least 3-4x a week and has it checked in class as well.  He is doing well on his medications.   For tobacco cessation he is getting better.  He is staying between 6-18 cigarettes a day versus a full pack.  He is also set up to start a one on one smoking cessation class since the current classes were already in progress and did not restart until April.  He has strated to make the right steps.  We talked a little about combination therapy as he would not like to take  Chantix again.  I also gave him a fake cigarette to hold and use in lieu of a real one.  We will continue to monitor his tobacco use.  Tamela Oddi has been attending class regularly and has been working hard to stop smoking. He started wearing the patch 2 weeks ago. He still smokes  about 16-18 cigarettes a day. His blood pressure has been withing normal limits which he checks at home. His shortness of breath is getting a little better but somedays are rougher than others. He has yet to go to the smoking cessation class on March 19th.      Expected Outcomes  Short: Start smoking cessation classes.  Long: Continue to work on weight gain.   Short: attened smoking cessation class on the 19th. Long: quit smoking.         Core Components/Risk Factors/Patient Goals at Discharge (Final Review):  Goals and Risk Factor Review - 03/03/18 1151      Core Components/Risk Factors/Patient Goals Review   Personal Goals Review  Weight Management/Obesity;Heart Failure;Hypertension;Tobacco Cessation;Lipids;Improve shortness of breath with ADL's    Review  Tamela Oddi has been attending class regularly and has been working hard to stop smoking. He started wearing the patch 2 weeks ago. He still smokes about 16-18 cigarettes a day. His blood pressure has been withing normal limits which he checks at home. His shortness of breath is getting a little better but somedays are rougher than others. He has yet to go to the smoking cessation class on March 19th.    Expected Outcomes  Short: attened smoking cessation class on the 19th. Long: quit smoking.       ITP Comments: ITP Comments    Row Name 01/24/18 1218 01/30/18 0936 02/24/18 1227 02/27/18 0838 03/27/18 0901   ITP Comments  Medical review completed today. ITP to Dr Sabra Heck for review, changes as needed and signature.   Documentation of diagnosis can be found in [facility] Encounter 12/25/2017.  [facility] admission. Did not complete program last year, because his wife became very sick and died in 2023-07-25.   30 day review completed. ITP sent to Dr. Emily Filbert Director of [place]. Continue with ITP unless changes are made by physician.  Jamarrion's daughter delivered his Nicotine patches during class. He is very motivated to quit smoking and has an appointment with a  [employer] rep in a couple weeks   30 day review completed. ITP sent to Dr. Emily Filbert Director of [place]. Continue with ITP unless changes are made by physician.   30 day review completed. ITP sent to Dr. Emily Filbert Director of [place]. Continue with ITP unless changes are made by physician   Row Name 04/12/18 1456 04/24/18 0853 04/25/18 1601 05/09/18 1500 05/22/18 0839   ITP Comments  Called [facility] to check on status of return.  He has been out after having a difficult time breathing with the pollen. He was in the ED on Monday.  He was started on [facility] medication and is trying to get a follow up appointment with his PCP and will return when he is able.  He wants to finish the program this time!   30 day review completed. ITP sent to Dr. Emily Filbert Director of [place]. Continue with ITP unless changes are made by physician  Out since last reveiw, still recovering  Called to check on status of return to rehab.  He said that he is feeling better and ready to get back. He is going to aim to return  tomorrow or Friday.     30 day review completed. ITP sent to Dr. Emily Filbert Director of [place]. Continue with ITP unless changes are made by physician      Comments: 30 day review

## 2018-05-23 ENCOUNTER — Telehealth: Payer: Self-pay | Admitting: Family Medicine

## 2018-05-23 LAB — CBC WITH DIFFERENTIAL/PLATELET
BASOS ABS: 0 10*3/uL (ref 0.0–0.2)
Basos: 0 %
EOS (ABSOLUTE): 0.1 10*3/uL (ref 0.0–0.4)
Eos: 1 %
Hematocrit: 44.3 % (ref 37.5–51.0)
Hemoglobin: 15.1 g/dL (ref 13.0–17.7)
IMMATURE GRANS (ABS): 0 10*3/uL (ref 0.0–0.1)
IMMATURE GRANULOCYTES: 0 %
LYMPHS: 21 %
Lymphocytes Absolute: 1.8 10*3/uL (ref 0.7–3.1)
MCH: 30.1 pg (ref 26.6–33.0)
MCHC: 34.1 g/dL (ref 31.5–35.7)
MCV: 88 fL (ref 79–97)
MONOS ABS: 1 10*3/uL — AB (ref 0.1–0.9)
Monocytes: 11 %
NEUTROS PCT: 67 %
Neutrophils Absolute: 5.8 10*3/uL (ref 1.4–7.0)
PLATELETS: 287 10*3/uL (ref 150–450)
RBC: 5.02 x10E6/uL (ref 4.14–5.80)
RDW: 13.5 % (ref 12.3–15.4)
WBC: 8.7 10*3/uL (ref 3.4–10.8)

## 2018-05-23 LAB — HEMOGLOBIN A1C
ESTIMATED AVERAGE GLUCOSE: 126 mg/dL
Hgb A1c MFr Bld: 6 % — ABNORMAL HIGH (ref 4.8–5.6)

## 2018-05-23 LAB — COMPREHENSIVE METABOLIC PANEL
A/G RATIO: 2 (ref 1.2–2.2)
ALT: 17 IU/L (ref 0–44)
AST: 17 IU/L (ref 0–40)
Albumin: 4.5 g/dL (ref 3.6–4.8)
Alkaline Phosphatase: 73 IU/L (ref 39–117)
BILIRUBIN TOTAL: 0.2 mg/dL (ref 0.0–1.2)
BUN/Creatinine Ratio: 21 (ref 10–24)
BUN: 20 mg/dL (ref 8–27)
CALCIUM: 10.4 mg/dL — AB (ref 8.6–10.2)
CHLORIDE: 93 mmol/L — AB (ref 96–106)
CO2: 31 mmol/L — ABNORMAL HIGH (ref 20–29)
Creatinine, Ser: 0.95 mg/dL (ref 0.76–1.27)
GFR calc non Af Amer: 84 mL/min/{1.73_m2} (ref >59)
GFR, EST AFRICAN AMERICAN: 97 mL/min/{1.73_m2} (ref >59)
GLUCOSE: 161 mg/dL — AB (ref 65–99)
Globulin, Total: 2.2 g/dL (ref 1.5–4.5)
POTASSIUM: 4.6 mmol/L (ref 3.5–5.2)
Sodium: 141 mmol/L (ref 134–144)
TOTAL PROTEIN: 6.7 g/dL (ref 6.0–8.5)

## 2018-05-23 NOTE — Telephone Encounter (Signed)
Clarification given to pharmacist per Dr. Tamala Julian  3 tablets 2 times daily for 2 days 3 tablets daily for 3 days 2 tablets daily for 3 days 1 tablet daily for 3 days

## 2018-05-23 NOTE — Telephone Encounter (Signed)
Copied from [place] [phone]. Topic: Quick Communication - See Telephone Encounter >> May 23, 2018 10:32 AM Conception Chancy, NT wrote: CRM for notification. See Telephone encounter for: 05/23/18.  Steele Berg is calling from [facility] and states she needs clarification on predniSONE (DELTASONE) 20 MG tablet. She states she spoke with Dr. Tamala Julian today and needs a call back with clarification.   [place], [place], [place] - [address] [place] [address] Phone: [phone] Fax: [phone]

## 2018-05-24 ENCOUNTER — Ambulatory Visit: Payer: BLUE CROSS/BLUE SHIELD | Admitting: Psychology

## 2018-05-25 NOTE — Telephone Encounter (Signed)
Spoke with patient on 05/23/18 at 10:00am; pulse oximetry this morning on room air of 95-97%; administered Albuterol neb 5.0mg  at 8:00am.  Dyspnea has improved; fatigued.  Needs Prednisone sent to [facility]; see note.  Rx sent.    Advised patient to present to ED if pulse oximetry on room air less than 90%; expressed understanding.  Continue Albuterol 5mg  nebulizer every six hours.  No improvement in throat symptoms.  Spoke with patient on 05/25/18 at 9:45; slowly improving; pulse oximetry remaining in 92-95% room air; using Albuterol neb every 6 hours; taking 40mg  Prednisone today.

## 2018-06-01 ENCOUNTER — Ambulatory Visit
Admission: RE | Admit: 2018-06-01 | Discharge: 2018-06-01 | Disposition: A | Discharge status: Home / Self Care | Payer: BLUE CROSS/BLUE SHIELD | Source: Ambulatory Visit | Attending: Internal Medicine | Admitting: Internal Medicine

## 2018-06-01 ENCOUNTER — Ambulatory Visit (INDEPENDENT_AMBULATORY_CARE_PROVIDER_SITE_OTHER): Payer: BLUE CROSS/BLUE SHIELD | Admitting: Internal Medicine

## 2018-06-01 ENCOUNTER — Encounter: Payer: Self-pay | Admitting: Internal Medicine

## 2018-06-01 VITALS — BP 102/62 | HR 101 | Resp 16 | Ht 68.9 in | Wt 137.0 lb

## 2018-06-01 DIAGNOSIS — D3502 Benign neoplasm of left adrenal gland: Secondary | ICD-10-CM | POA: Insufficient documentation

## 2018-06-01 DIAGNOSIS — Z955 Presence of coronary angioplasty implant and graft: Secondary | ICD-10-CM | POA: Diagnosis not present

## 2018-06-01 DIAGNOSIS — J439 Emphysema, unspecified: Secondary | ICD-10-CM | POA: Insufficient documentation

## 2018-06-01 DIAGNOSIS — R0603 Acute respiratory distress: Secondary | ICD-10-CM | POA: Insufficient documentation

## 2018-06-01 DIAGNOSIS — F17200 Nicotine dependence, unspecified, uncomplicated: Secondary | ICD-10-CM | POA: Diagnosis not present

## 2018-06-01 DIAGNOSIS — D3501 Benign neoplasm of right adrenal gland: Secondary | ICD-10-CM | POA: Diagnosis not present

## 2018-06-01 IMAGING — CT CT ANGIO CHEST
2 of 7 series · 18 of 46 positions shown · IV contrast (iopamidol)
Comparison: [DATE], [DATE] and [DATE]

CLINICAL DATA: Dyspnea 2 weeks with no relief on prednisone.
Current smoker. Stable left upper lobe nodule.

EXAM:
CT ANGIOGRAPHY CHEST WITH CONTRAST
TECHNIQUE: Multidetector CT imaging of the chest was performed using the
standard protocol during bolus administration of intravenous
contrast. Multiplanar CT image reconstructions and MIPs were
obtained to evaluate the vascular anatomy.
CONTRAST:  75mL [3N] IOPAMIDOL ([3N]) INJECTION 76%

[Series 5: thins · axial · 0.74mm/px · z∈[-556,-250]mm · 15 of 344 slices shown]
[im 19/344  lung]
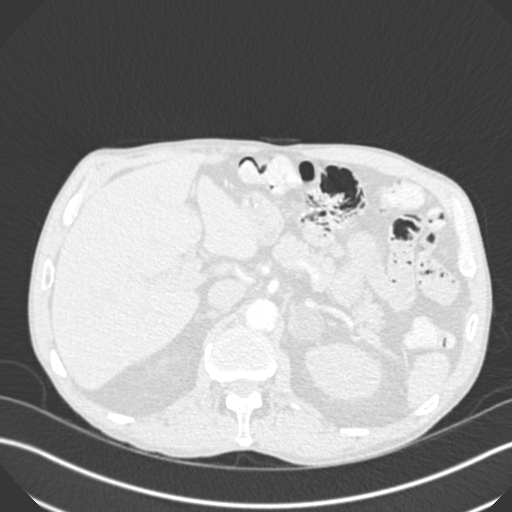
[im 37/344  soft-tissue]
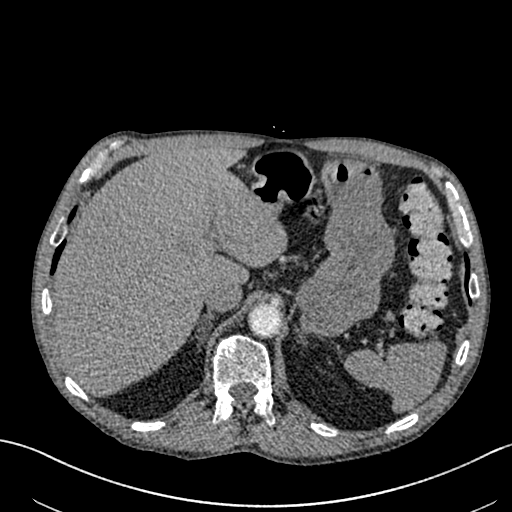
[im 73/344  lung]
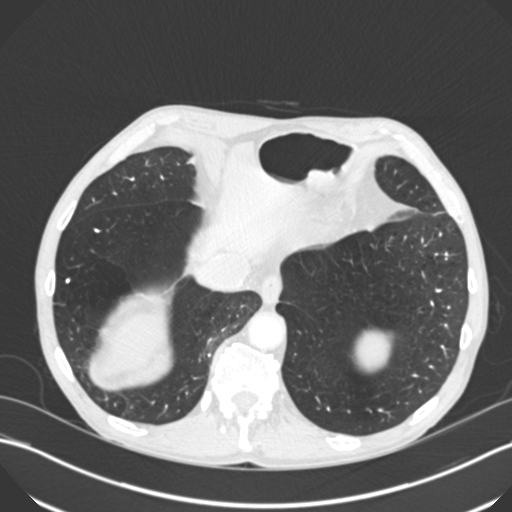
[im 91/344  soft-tissue]
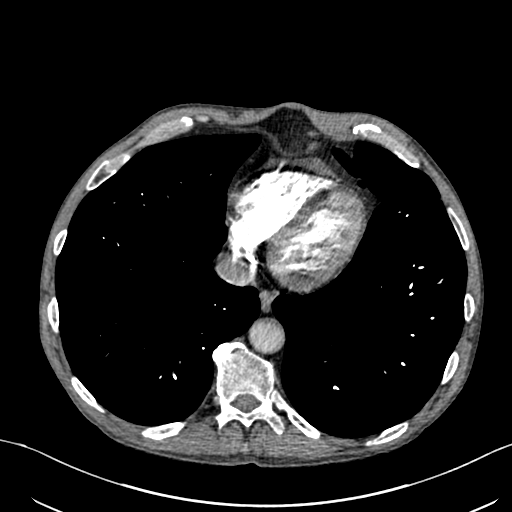
[im 109/344  lung]
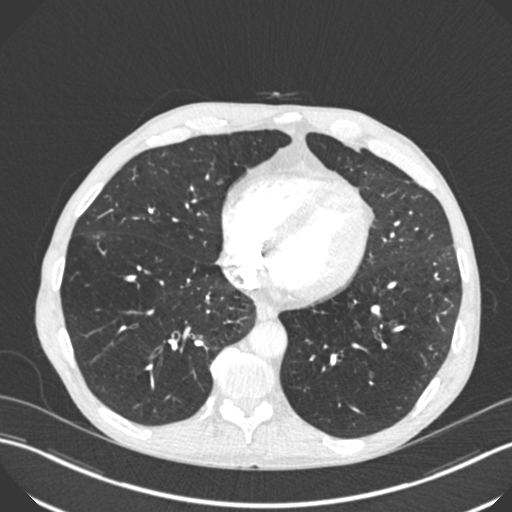
[im 127/344  soft-tissue]
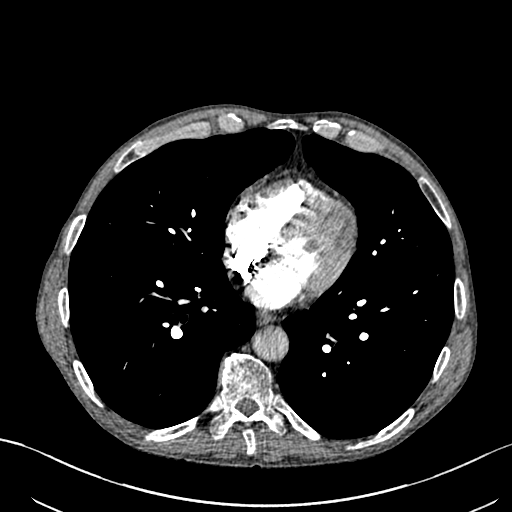
[im 145/344  lung]
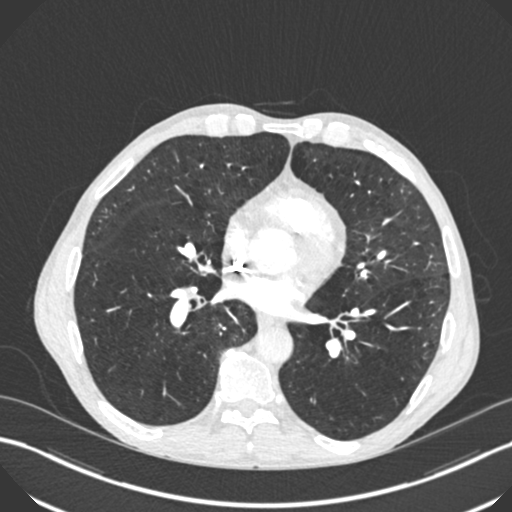
[im 181/344  soft-tissue]
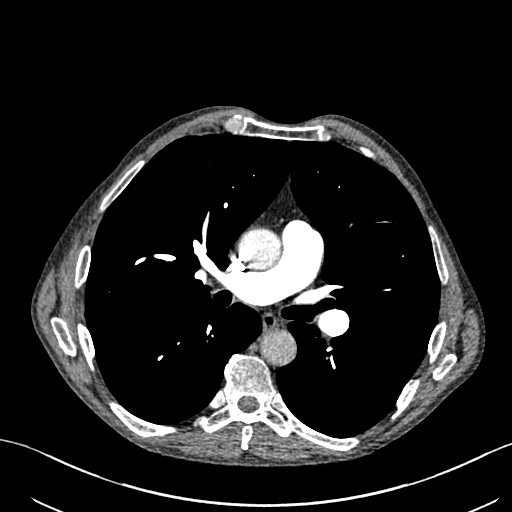
[im 199/344  lung]
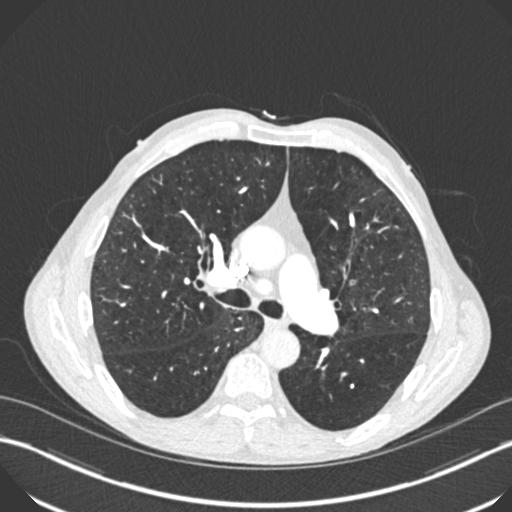
[im 217/344  soft-tissue]
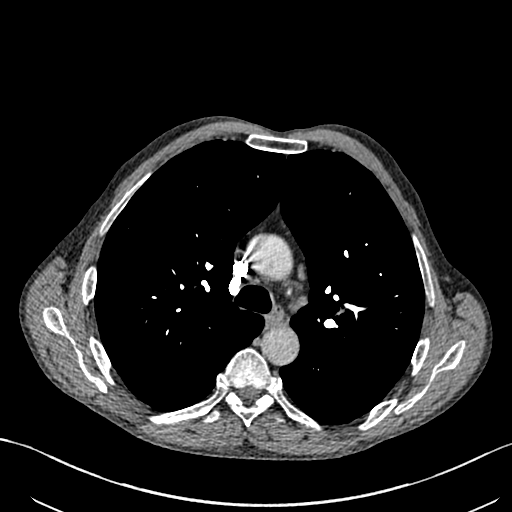
[im 235/344  lung]
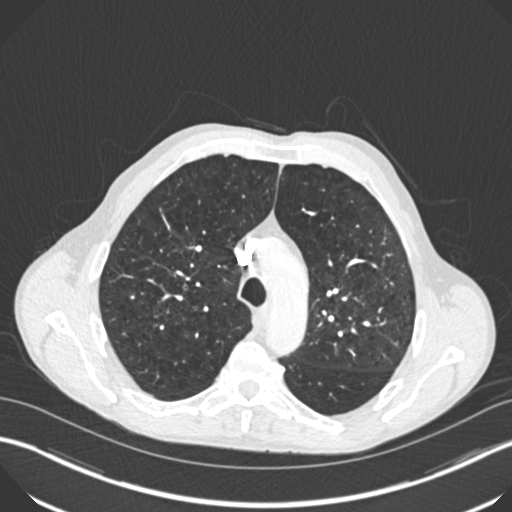
[im 253/344  soft-tissue]
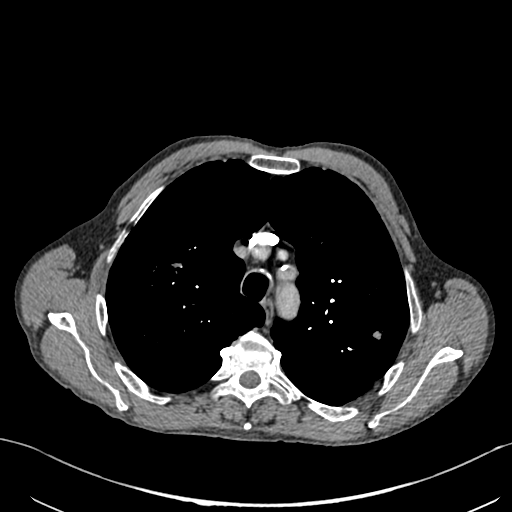
[im 289/344  lung]
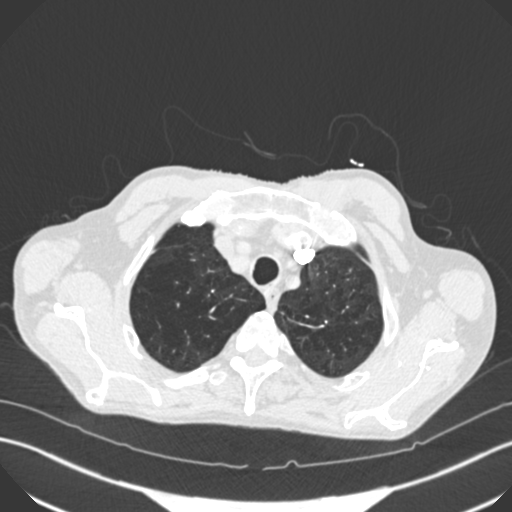
[im 307/344  soft-tissue]
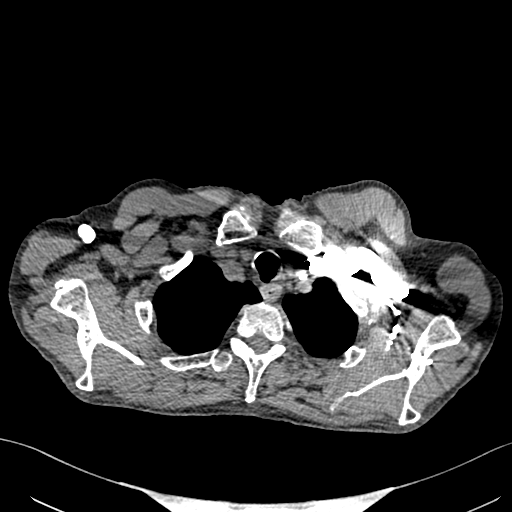
[im 325/344  lung]
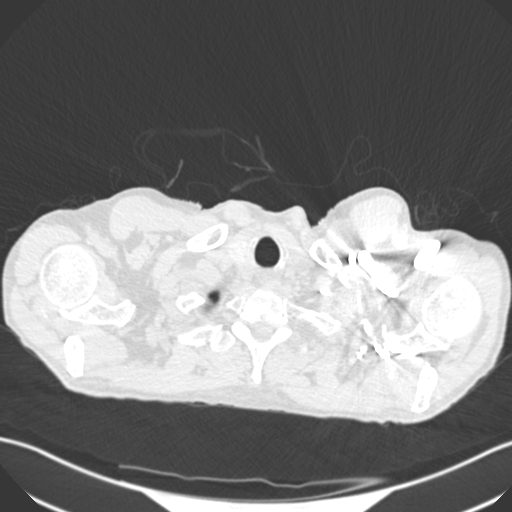

[Series 7: coronal mpr · coronal · 0.67mm/px · 3 of 89 slices shown]
[im 23/89  soft-tissue]
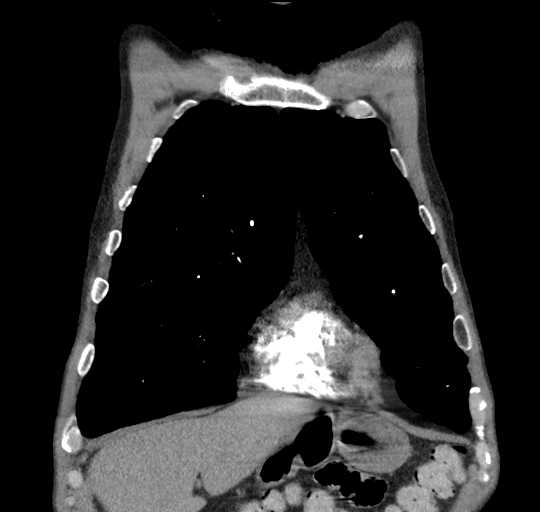
[im 45/89  soft-tissue]
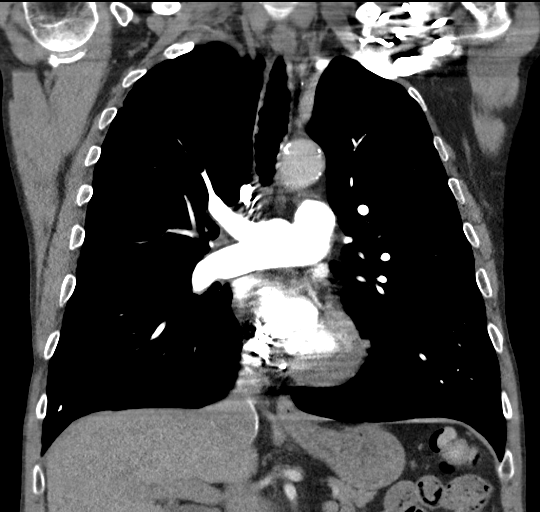
[im 67/89  soft-tissue]
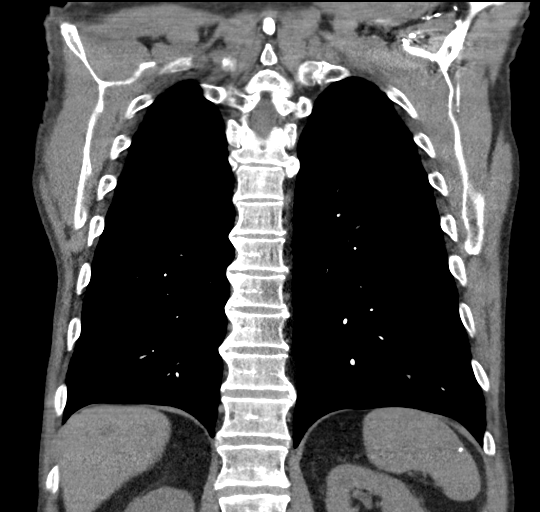

[18 of 46 positions shown; findings below may reference images not displayed]

FINDINGS: Cardiovascular: Heart is normal size. Tiny amount of pericardial
fluid is present. Suggestion of coronary stent over the left
anterior descending and right coronary arteries unchanged. Pulmonary
arterial system is normal without emboli. Remaining vascular
structures are unremarkable.

Mediastinum/Nodes: No mediastinal or hilar adenopathy. Remaining
mediastinal structures are within normal.

Lungs/Pleura: Stable 4 mm nodule over the left apex without
significant change since [DATE] cm nodule over the left upper
lobe slightly smaller compared to [DATE] (1.6 cm). Slight
decrease in size of right upper lobe nodule measuring 1.1 cm
(previously 1.5 cm). Stable subcentimeter nodule right upper lobe.
New focal 8 mm nodular opacification over the left upper lobe.
Resolution of 1.8 cm nodular focus of opacification over the
anterior left upper lobe. Two stable subcentimeter nodule opacities
over the superior segment left lower lobe. Few small calcified
granulomas over the lower lobes bilaterally. No focal airspace
consolidation or effusion. Moderate emphysematous disease unchanged.
Airways are within normal.

Upper Abdomen: Stable bilateral adrenal adenomas. No acute findings.

Musculoskeletal: Minimal degenerate change of the spine.

Review of the MIP images confirms the above findings.
IMPRESSION: No evidence of pulmonary embolism.

Waxing and waning bilateral pulmonary nodular process as described.
New 8 mm nodular ill-defined focus of opacification over the lingula
which may represent an acute inflammatory or infectious process. No
effusion. Recommend follow-up CT 6-8 weeks.

Emphysema ([3N]-[3N]).

Coronary stents as described.

Bilateral adrenal adenomas unchanged.

## 2018-06-01 MED ORDER — IOPAMIDOL (ISOVUE-370) INJECTION 76%
75.0000 mL | Freq: Once | INTRAVENOUS | Status: AC | PRN
  Administered 2018-06-01: 75 mL via INTRAVENOUS

## 2018-06-01 NOTE — Progress Notes (Signed)
PULMONARY OFFICE FOLLOW UP NOTE  Requesting MD/Service: [name], MD Date of initial consultation: 08/10/16 Reason for consultation: COPD, smoker  PT PROFILE: 67 M smoker with severe emphysema seen by multiple pulmonologists in past ([place], [place], [place], [place]) referred for further eval and mgmt of COPD   DATA: 07/24/12 Office Spirometry: Severe obstruction (FEV1 31% pred) 08/31/13 Office Spirometry: very severe obstruction (FEV1 25% pred) 06/18/15 CT chest: Severe emphysema 03/03/17 Overnight oximetry: Lowest SpO2 72%. 95% of time SpO2 was below 90% 03/03/17 6MWT: 65 meters. Desat to 84% 10/04/17 CT chest: Two new irregularly marginated nodules, one in the right upper lobe, and a second in the mid left upper lobe worrisome for either synchronous lung carcinoma or metastatic involvement of the lungs. The two small nodules described on the prior dictated report have resolved in the left superior segment and most likely were postinflammatory. Diffuse centrilobular emphysema. 10/13/17 PET scan: The new left upper lobe nodule is FDG avid consistent with malignancy. Recommend tissue confirmation. The new nodule in the right upper lobe demonstrates low level uptake. While the level of uptake suggests the possibility of an inflammatory or infectious process, a low-grade malignancy is not excluded. Given the suspected malignancy on the left, tissue confirmation should be considered on the right despite the low level of uptake. No distant metastatic disease identified 10/27/17 ENB ([place]): Transbronchial Fine Needle Aspirations 21G X7, Transbronchial Forceps Biopsy X6. ALVEOLATED LUNG TISSUE WITH HEMOSIDERIN LADEN MACROPHAGES.  NEGATIVE FOR ATYPIA AND MALIGNANCY 12/07/17 CTA chest: No pulmonary embolus identified. 2. Anterior left upper lobe ground-glass and nodular consolidation likely representing pneumonitis. 3. Stable bilateral upper lobe pulmonary nodules.4. Stable emphysema. 01/21/18 CT  chest: There is new masslike architectural distortion and ground-glass attenuation surrounding the index nodule within the right upper lobe. The appearance favors an inflammatory or infectious process. Underlying tumor is considered less favored but not excluded and follow-up imaging with a repeat CT of the chest following appropriate antibiotic therapy is recommended in 3-4 weeks following trial of antibiotic therapy to ensure resolution and exclude underlying malignancy. 2. The index nodule in the left upper lobe is slightly decreased in volume when compared with the previous exam favoring a benign process. Attention on follow-up imaging advise. 3. Advanced changes of emphysema (very severe)  INTERVAL: Hospitalized 01/07-01/11/19 for COPD exacerbation. Required BiPAP briefly   SUBJ: Pt normally sees Dr. [name], he is here for an acute visit. He notes that about 1 month ago he feels that his breathing has been worse. He was started on welbutrin around that time and he seems to attribute his symptoms to this. His sleeping pill was also increased. He is also appears anxious today.  He is taking spiriva once daily, advair disk bid, nebs of duonebs 4 times daily. He feels that the nebs help with his breathing. He is wearing oxygen 2L at night ant nothing during the day, he does not normally need to wear it but is wearing it today though his oxygen saturation is normal.  He is on his second course of prednisone, and is now on 20 mg for 5 more day.  He continues to smoke less than half ppd.   OBJ: Vitals:   06/01/18 1439 06/01/18 1445  BP:  102/62  Pulse:  (!) 101  Resp: 16   SpO2:  96%  Weight: 137 lb (62.1 kg)   Height: 5' 8.9" (1.75 m)   RA  EXAM:  Pt has no distress, but appears anxious and fidgety.  Gen: No  overt distress, appears older than true age 67: NCAT, sclera white, oropharynx normal Neck: Supple without LAN, thyromegaly, JVD Lungs: breath sounds moderately diminished, no  wheezes or other adventitious sounds Cardiovascular: RRR, no murmurs noted Abdomen: Soft, nontender, normal BS Ext: Tobacco staining of digits.  No clubbing, cyanosis or edema Neuro: No focal deficits on limited exam  DATA:   BMP Latest Ref Rng & Units 05/22/2018 04/10/2018 12/29/2017  Glucose 65 - 99 mg/dL 161(H) 116(H) 118(H)  BUN 8 - 27 mg/dL 20 20 23(H)  Creatinine 0.76 - 1.27 mg/dL 0.95 0.65 0.65  BUN/Creat Ratio 10 - 24 21 - -  Sodium 134 - 144 mmol/L 141 136 140  Potassium 3.5 - 5.2 mmol/L 4.6 4.2 4.4  Chloride 96 - 106 mmol/L 93(L) 98(L) 96(L)  CO2 20 - 29 mmol/L 31(H) 30 36(H)  Calcium 8.6 - 10.2 mg/dL 10.4(H) 9.6 9.2    CBC Latest Ref Rng & Units 05/22/2018 04/10/2018 12/29/2017  WBC 3.4 - 10.8 x10E3/uL 8.7 10.5 11.0(H)  Hemoglobin 13.0 - 17.7 g/dL 15.1 14.3 13.1  Hematocrit 37.5 - 51.0 % 44.3 41.3 40.4  Platelets 150 - 450 x10E3/uL 287 317 243   CXR:  NNF  IMPRESSION:   Acute exacerbation of COPD with acute respiratory distress. Nicotine abuse.  PLAN:  --Continue Advair and Spiriva --Continue duoneb.  --Will check CT Angio, reassured that if this is negative, his severe emphysema make take 4-6 weeks for him to feel back to normal.  Also discussed that with this exacerbation he may be at a new lower baseline which could continue to happen as he continues to smoke. --Discussed the importance of smoking cessation. Spent 3 min in discussion.    Marda Stalker, MD.   Board Certified in Internal Medicine, Pulmonary Medicine, [place], and Sleep Medicine.  [facility] Office Number: [phone] Pager: [phone]  Patricia Pesa, M.D.  Merton Border, M.D  06/01/2018  2:49 PM

## 2018-06-01 NOTE — Patient Instructions (Signed)
--  Quitting smoking is the most important thing that you can do for your health.  --Quitting smoking will have greater affect on your health than any medicine that we can give you.    

## 2018-06-07 ENCOUNTER — Encounter: Payer: Self-pay | Admitting: *Deleted

## 2018-06-07 ENCOUNTER — Telehealth: Payer: Self-pay | Admitting: *Deleted

## 2018-06-07 DIAGNOSIS — J449 Chronic obstructive pulmonary disease, unspecified: Secondary | ICD-10-CM

## 2018-06-07 NOTE — Telephone Encounter (Signed)
Called to check on Leslie Sims.  He has been out since April.  He continues to have ongoing pulmonary issues.  Left message stating that we will discharge him at this time.

## 2018-06-07 NOTE — Progress Notes (Unsigned)
Pulmonary Individual Treatment Plan  Patient Details  Name: Leslie Sims MRN: [id number] Date of Birth: January 27, 1951 Referring Provider:     Pulmonary Rehab from 01/24/2018 in [facility] Cardiac and Pulmonary Rehab  Referring Provider  [name]      Initial Encounter Date:    Pulmonary Rehab from 01/24/2018 in [facility] Cardiac and Pulmonary Rehab  Date  01/24/18  Referring Provider  [name]      Visit Diagnosis: Chronic obstructive pulmonary disease, unspecified COPD type ([place])  Patient's Home Medications on Admission:  Current Outpatient Medications:  .  albuterol (VENTOLIN HFA) 108 (90 Base) MCG/ACT inhaler, USE 2 INHALATIONS EVERY 6 HOURS AS NEEDED FOR WHEEZING, Disp: 48 g, Rfl: 3 .  aspirin 81 MG tablet, Take 81 mg by mouth daily.  , Disp: , Rfl:  .  atorvastatin (LIPITOR) 40 MG tablet, Take 1 tablet (40 mg total) by mouth daily., Disp: 90 tablet, Rfl: 3 .  buPROPion (WELLBUTRIN XL) 150 MG 24 hr tablet, TAKE ONE TABLET BY MOUTH EVERY MORNING WITH BREAKFAST, Disp: , Rfl: 0 .  Fluticasone-Salmeterol (ADVAIR DISKUS) 500-50 MCG/DOSE AEPB, Inhale 1 puff into the lungs 2 (two) times daily., Disp: 180 each, Rfl: 3 .  furosemide (LASIX) 20 MG tablet, Take 1 tablet (20 mg total) by mouth daily., Disp: 30 tablet, Rfl: 0 .  ipratropium-albuterol (DUONEB) 0.5-2.5 (3) MG/3ML SOLN, Take 3 mLs by nebulization every 6 (six) hours as needed., Disp: 225 mL, Rfl: 3 .  LORazepam (ATIVAN) 1 MG tablet, TAKE 1/2 TABLET BY MOUTH EVERY 6 HOURS AS NEEDED FOR ANXIETY, Disp: 60 tablet, Rfl: 2 .  losartan (COZAAR) 25 MG tablet, TAKE 1 TABLET DAILY, Disp: 90 tablet, Rfl: 3 .  mirtazapine (REMERON) 30 MG tablet, Take 30 mg by mouth at bedtime., Disp: , Rfl: 0 .  Multiple Vitamin (MULTIVITAMIN) capsule, Take 1 capsule by mouth daily., Disp: , Rfl:  .  nitroGLYCERIN (NITROSTAT) 0.4 MG SL tablet, Place 1 tablet (0.4 mg total) under the tongue every 5 (five) minutes as needed., Disp: 25 tablet, Rfl: 3 .  predniSONE  (DELTASONE) 20 MG tablet, Take 1 tablet (20 mg total) by mouth daily with breakfast., Disp: 15 tablet, Rfl: 0 .  sucralfate (CARAFATE) 1 GM/10ML suspension, Take 10 mLs (1 g total) by mouth 4 (four) times daily -  with meals and at bedtime., Disp: 3600 mL, Rfl: 3 .  tiotropium (SPIRIVA HANDIHALER) 18 MCG inhalation capsule, Place 1 capsule (18 mcg total) into inhaler and inhale daily., Disp: 90 capsule, Rfl: 3  Past Medical History: Past Medical History:  Diagnosis Date  . Allergic rhinitis   . Anxiety   . Atherosclerosis   . Back pain   . Bronchitis    09-14-11  . CARDIOMYOPATHY 03/24/2010   did not tolerate Toprol and Lisinopril.   . Chest pain   . CHF (congestive heart failure) ([place])   . Chronic airway obstruction, not elsewhere classified   . Colon polyps 08/2012   colonoscopy; multiple colon polyps; repeat colonoscopy in one year.  [name] COPD (chronic obstructive pulmonary disease) ([place])   . Coronary artery disease 11/2009   Anterior MI with late presentation 11/2009. Cath showed a 100% proximal LAD occlusion, 99% mid RCA. Had a PCI and 2 DES to both LAD and RCA. EF was 35%. Nuclear stress test 05/13: prior anterior/inferior infarcts without ischemic, EF 43%, cardiac cath in 02/14: Widely patent stents with no other obstructive disease. EF 40%   . Depression   . GERD (gastroesophageal reflux  disease)   . Headache(784.0)   . Helicobacter pylori (H. pylori)   . Hemorrhoids   . Hernia    inguinal  . Hyperlipidemia   . Hypertension   . Insomnia   . MI (myocardial infarction) ([place])   . Myalgia   . Peptic ulcer   . Shortness of breath dyspnea   . Status post dilation of esophageal narrowing 2000  . Thyroid dysfunction   . Tinea pedis   . Wears dentures    partial upper    Tobacco Use: Social History   Tobacco Use  Smoking Status Current Every Day Smoker  . Packs/day: 0.50  . Years: 42.00  . Pack years: 21.00  . Types: Cigarettes  Smokeless Tobacco Never Used  Tobacco  Comment   02/15/18 For tobacco cessation he is getting better.  He is staying between 6-18 cigarettes a day versus a full pack.  He is also set up to start a one on one smoking cessation class since the current classes were already in progress and did not restart un    Labs: Recent Review Flowsheet Data    Labs for ITP Cardiac and Pulmonary Rehab Latest Ref Rng & Units 06/17/2017 07/27/2017 12/23/2017 12/25/2017 05/22/2018   Cholestrol 100 - 199 mg/dL - 143 - - -   LDLCALC 0 - 99 mg/dL - 67 - - -   HDL >39 mg/dL - 62 - - -   Trlycerides 0 - 149 mg/dL - 72 - - -   Hemoglobin A1c 4.8 - 5.6 % 5.8(H) - - - 6.0(H)   HCO3 20.0 - 28.0 mmol/L - - 37.2(H) 41.4(H) -   O2SAT % - - 87.4 81.6 -       Pulmonary Assessment Scores: Pulmonary Assessment Scores    Row Name 01/24/18 1247         ADL UCSD   ADL Phase  Entry New Entry numbers 01/24/2018     SOB Score total  86     Rest  2     Walk  4     Stairs  5     Bath  3     Dress  4     Shop  5       CAT Score   CAT Score  28       mMRC Score   mMRC Score  1        Pulmonary Function Assessment: Pulmonary Function Assessment - 01/24/18 1246      Pulmonary Function Tests   FVC%  72 % date performed 11/01/17    FEV1%  27 %    FEV1/FVC Ratio  30      Breath   Shortness of Breath  Yes;Limiting activity;Fear of Shortness of Breath       Exercise Target Goals:    Exercise Program Goal: Individual exercise prescription set using results from initial 6 min walk test and THRR while considering  patient's activity barriers and safety.    Exercise Prescription Goal: Initial exercise prescription builds to 30-45 minutes a day of aerobic activity, 2-3 days per week.  Home exercise guidelines will be given to patient during program as part of exercise prescription that the participant will acknowledge.  Activity Barriers & Risk Stratification:   6 Minute Walk: 6 Minute Walk    Row Name 01/24/18 1258         6 Minute Walk   Distance   1000 feet     Walk Time  4.75  minutes     # of Rest Breaks  1     MPH  2.4     METS  3.4     RPE  11     Perceived Dyspnea   2     VO2 Peak  11.9     Symptoms  No     Resting HR  93 bpm     Resting BP  106/72     Resting Oxygen Saturation   95 %     Exercise Oxygen Saturation  during 6 min walk  80 %     Max Ex. HR  123 bpm     Max Ex. BP  132/64     2 Minute Post BP  108/64       Interval HR   1 Minute HR  106     2 Minute HR  104     3 Minute HR  102     4 Minute HR  123     6 Minute HR  120     2 Minute Post HR  97     Interval Heart Rate?  Yes       Interval Oxygen   Interval Oxygen?  Yes     Baseline Oxygen Saturation %  95 %     1 Minute Oxygen Saturation %  82 %     1 Minute Liters of Oxygen  2 L     2 Minute Oxygen Saturation %  82 %     2 Minute Liters of Oxygen  2 L     3 Minute Oxygen Saturation %  81 %     3 Minute Liters of Oxygen  2 L     4 Minute Oxygen Saturation %  83 %     4 Minute Liters of Oxygen  2 L     5 Minute Oxygen Saturation %  81 %     5 Minute Liters of Oxygen  2 L     6 Minute Oxygen Saturation %  80 %     6 Minute Liters of Oxygen  2 L     2 Minute Post Oxygen Saturation %  95 %     2 Minute Post Liters of Oxygen  2 L       Oxygen Initial Assessment: Oxygen Initial Assessment - 01/24/18 1220      Home Oxygen   Home Oxygen Device  Home Concentrator;E-Tanks    Sleep Oxygen Prescription  Continuous    Liters per minute  2    Home Exercise Oxygen Prescription  Continuous    Liters per minute  2 UNtil sees his pulmonary Doctor again.  This is post discharge form January 2019    Home at Rest Exercise Oxygen Prescription  Continuous    Liters per minute  2    Compliance with Home Oxygen Use  Yes      Initial 6 min Walk   Oxygen Used  Continuous;E-Tanks    Liters per minute  2      Program Oxygen Prescription   Program Oxygen Prescription  Continuous;E-Tanks    Liters per minute  2      Intervention   Short Term Goals  To learn  and exhibit compliance with exercise, home and travel O2 prescription;To learn and understand importance of monitoring SPO2 with pulse oximeter and demonstrate accurate use of the pulse oximeter.;To learn and understand importance of maintaining oxygen saturations>88%;To learn and demonstrate proper  use of respiratory medications;To learn and demonstrate proper pursed lip breathing techniques or other breathing techniques.    Long  Term Goals  Exhibits compliance with exercise, home and travel O2 prescription;Verbalizes importance of monitoring SPO2 with pulse oximeter and return demonstration;Maintenance of O2 saturations>88%;Exhibits proper breathing techniques, such as pursed lip breathing or other method taught during program session;Compliance with respiratory medication;Demonstrates proper use of MDI's       Oxygen Re-Evaluation: Oxygen Re-Evaluation    Row Name 02/03/18 1136 03/03/18 1155           Program Oxygen Prescription   Program Oxygen Prescription  Continuous;E-Tanks  Continuous;E-Tanks      Liters per minute  2  2        Home Oxygen   Home Oxygen Device  Home Concentrator;E-Tanks  Home Concentrator;E-Tanks      Sleep Oxygen Prescription  Continuous  Continuous      Liters per minute  2  2      Home Exercise Oxygen Prescription  Continuous  Continuous      Liters per minute  2  2      Home at Rest Exercise Oxygen Prescription  Continuous  Continuous      Liters per minute  2  2      Compliance with Home Oxygen Use  Yes  Yes        Goals/Expected Outcomes   Short Term Goals  To learn and exhibit compliance with exercise, home and travel O2 prescription;To learn and understand importance of monitoring SPO2 with pulse oximeter and demonstrate accurate use of the pulse oximeter.;To learn and understand importance of maintaining oxygen saturations>88%;To learn and demonstrate proper use of respiratory medications;To learn and demonstrate proper pursed lip breathing techniques  or other breathing techniques.  To learn and exhibit compliance with exercise, home and travel O2 prescription;To learn and understand importance of monitoring SPO2 with pulse oximeter and demonstrate accurate use of the pulse oximeter.;To learn and understand importance of maintaining oxygen saturations>88%;To learn and demonstrate proper use of respiratory medications;To learn and demonstrate proper pursed lip breathing techniques or other breathing techniques.      Long  Term Goals  Exhibits compliance with exercise, home and travel O2 prescription;Verbalizes importance of monitoring SPO2 with pulse oximeter and return demonstration;Maintenance of O2 saturations>88%;Exhibits proper breathing techniques, such as pursed lip breathing or other method taught during program session;Compliance with respiratory medication;Demonstrates proper use of MDI's  Exhibits compliance with exercise, home and travel O2 prescription;Verbalizes importance of monitoring SPO2 with pulse oximeter and return demonstration;Maintenance of O2 saturations>88%;Exhibits proper breathing techniques, such as pursed lip breathing or other method taught during program session;Compliance with respiratory medication;Demonstrates proper use of MDI's      Comments  Reviewed PLB technique with pt.  Talked about how it work and it's important to maintaining his exercise saturations.    Leslie Sims has a pulse oximeter at home and understands that while exercising his oxygen should be above 88 percent. He is taking his respiratory medications regularly. He sates that he is more short of breath in the morning when he wakes up. He wears 2 liters at night when he sleeps. Sometimes when he uses humidity he wakes up with a stuffy nose and when he does not use it he wakes up to dry. Informed him to check his concentrator and make sure the filter is clean.      Goals/Expected Outcomes  Short: Become more profiecient at using PLB.   Long: Become independent  at using PLB.  Short: check his home cencentrator filter. Long: maintain proper use of concentrator         Oxygen Discharge (Final Oxygen Re-Evaluation): Oxygen Re-Evaluation - 03/03/18 1155      Program Oxygen Prescription   Program Oxygen Prescription  Continuous;E-Tanks    Liters per minute  2      Home Oxygen   Home Oxygen Device  Home Concentrator;E-Tanks    Sleep Oxygen Prescription  Continuous    Liters per minute  2    Home Exercise Oxygen Prescription  Continuous    Liters per minute  2    Home at Rest Exercise Oxygen Prescription  Continuous    Liters per minute  2    Compliance with Home Oxygen Use  Yes      Goals/Expected Outcomes   Short Term Goals  To learn and exhibit compliance with exercise, home and travel O2 prescription;To learn and understand importance of monitoring SPO2 with pulse oximeter and demonstrate accurate use of the pulse oximeter.;To learn and understand importance of maintaining oxygen saturations>88%;To learn and demonstrate proper use of respiratory medications;To learn and demonstrate proper pursed lip breathing techniques or other breathing techniques.    Long  Term Goals  Exhibits compliance with exercise, home and travel O2 prescription;Verbalizes importance of monitoring SPO2 with pulse oximeter and return demonstration;Maintenance of O2 saturations>88%;Exhibits proper breathing techniques, such as pursed lip breathing or other method taught during program session;Compliance with respiratory medication;Demonstrates proper use of MDI's    Comments  Leslie Sims has a pulse oximeter at home and understands that while exercising his oxygen should be above 88 percent. He is taking his respiratory medications regularly. He sates that he is more short of breath in the morning when he wakes up. He wears 2 liters at night when he sleeps. Sometimes when he uses humidity he wakes up with a stuffy nose and when he does not use it he wakes up to dry. Informed him to  check his concentrator and make sure the filter is clean.    Goals/Expected Outcomes  Short: check his home cencentrator filter. Long: maintain proper use of concentrator       Initial Exercise Prescription: Initial Exercise Prescription - 01/24/18 1300      Date of Initial Exercise RX and Referring Provider   Date  01/24/18    Referring Provider  [name]      Oxygen   Oxygen  Continuous    Liters  2      Treadmill   MPH  2.4    Grade  1.4    Minutes  15    METs  3.3      Recumbant Bike   Level  1    RPM  60    Watts  25    Minutes  15    METs  3.3      T5 Nustep   Level  2    SPM  80    Minutes  15    METs  3.3      Prescription Details   Frequency (times per week)  3    Duration  Progress to 45 minutes of aerobic exercise without signs/symptoms of physical distress      Intensity   THRR 40-80% of Max Heartrate  118-143    Ratings of Perceived Exertion  11-13    Perceived Dyspnea  0-4      Resistance Training   Training Prescription  Yes  Weight  3 lb    Reps  10-15       Perform Capillary Blood Glucose checks as needed.  Exercise Prescription Changes: Exercise Prescription Changes    Row Name 02/14/18 1000 02/15/18 1500 02/28/18 1400 03/15/18 1400 03/27/18 1600     Response to Exercise   Blood Pressure (Admit)  142/80  -  124/70  108/58  122/70   Blood Pressure (Exercise)  126/74  -  -  -  -   Blood Pressure (Exit)  100/58  -  106/58  124/60  110/60   Heart Rate (Admit)  115 bpm  -  98 bpm  92 bpm  98 bpm   Heart Rate (Exercise)  130 bpm  -  121 bpm  102 bpm  109 bpm   Heart Rate (Exit)  101 bpm  -  102 bpm  96 bpm  92 bpm   Oxygen Saturation (Admit)  99 %  -  96 %  100 %  98 %   Oxygen Saturation (Exercise)  89 %  -  94 %  94 %  94 %   Oxygen Saturation (Exit)  98 %  -  95 %  95 %  94 %   Rating of Perceived Exertion (Exercise)  12  -  12  10  10    Perceived Dyspnea (Exercise)  2  -  1  1  2    Symptoms  none  -  none  none  none   Duration   Continue with 45 min of aerobic exercise without signs/symptoms of physical distress.  -  Continue with 45 min of aerobic exercise without signs/symptoms of physical distress.  Continue with 45 min of aerobic exercise without signs/symptoms of physical distress.  Continue with 45 min of aerobic exercise without signs/symptoms of physical distress.   Intensity  THRR unchanged  -  THRR unchanged  THRR unchanged  THRR unchanged     Progression   Progression  Continue to progress workloads to maintain intensity without signs/symptoms of physical distress.  -  Continue to progress workloads to maintain intensity without signs/symptoms of physical distress.  Continue to progress workloads to maintain intensity without signs/symptoms of physical distress.  Continue to progress workloads to maintain intensity without signs/symptoms of physical distress.   Average METs  2.58  -  2.82  3.04  2.9     Resistance Training   Training Prescription  Yes  -  Yes  Yes  Yes   Weight  4 lbs  -  5 lbs  5 lbs  5 lbs   Reps  10-15  -  10-15  10-15  10-15     Interval Training   Interval Training  No  -  No  No  No     Oxygen   Oxygen  Continuous  -  Continuous  Continuous  Continuous   Liters  2  -  2 3L as needed   2  2     Treadmill   MPH  2.4  -  2.5  2.5  2.5   Grade  1  -  0  0.5  0   Minutes  15  -  15  15  15    METs  3.17  -  2.91  3.09  2.91     Recumbant Bike   Level  2  -  4  4  5    RPM  70  -  64  85  59   Watts  22  -  29  32  34   Minutes  15  -  15  15  15    METs  3.21  -  3.84  3.84  3.84     T5 Nustep   Level  3  -  4  5  4    SPM  86  -  82  81  89   Minutes  15  -  15  15  15    METs  2  -  2.1  2.2  2.1     Home Exercise Plan   Plans to continue exercise at  -  Home (comment) walking, ellipitical  Home (comment) walking, ellipitical  Home (comment) walking, ellipitical  Home (comment) walking, ellipitical   Frequency  -  Add 2 additional days to program exercise sessions.  Add 2  additional days to program exercise sessions.  Add 2 additional days to program exercise sessions.  Add 2 additional days to program exercise sessions.   Initial Home Exercises Provided  -  02/15/18  02/15/18  02/15/18  02/15/18      Exercise Comments: Exercise Comments    Row Name 02/03/18 1135           Exercise Comments  First full day of exercise!  Patient was oriented to gym and equipment including functions, settings, policies, and procedures.  Patient's individual exercise prescription and treatment plan were reviewed.  All starting workloads were established based on the results of the 6 minute walk test done at initial orientation visit.  The plan for exercise progression was also introduced and progression will be customized based on patient's performance and goals.          Exercise Goals and Review: Exercise Goals    Row Name 01/24/18 1257             Exercise Goals   Increase Physical Activity  Yes       Intervention  Provide advice, education, support and counseling about physical activity/exercise needs.;Develop an individualized exercise prescription for aerobic and resistive training based on initial evaluation findings, risk stratification, comorbidities and participant's personal goals.       Expected Outcomes  Short Term: Attend rehab on a regular basis to increase amount of physical activity.;Long Term: Add in home exercise to make exercise part of routine and to increase amount of physical activity.;Long Term: Exercising regularly at least 3-5 days a week.       Increase Strength and Stamina  Yes       Intervention  Provide advice, education, support and counseling about physical activity/exercise needs.;Develop an individualized exercise prescription for aerobic and resistive training based on initial evaluation findings, risk stratification, comorbidities and participant's personal goals.       Expected Outcomes  Short Term: Increase workloads from initial  exercise prescription for resistance, speed, and METs.;Short Term: Perform resistance training exercises routinely during rehab and add in resistance training at home;Long Term: Improve cardiorespiratory fitness, muscular endurance and strength as measured by increased METs and functional capacity (6MWT)       Able to understand and use rate of perceived exertion (RPE) scale  Yes       Intervention  Provide education and explanation on how to use RPE scale       Expected Outcomes  Short Term: Able to use RPE daily in rehab to express subjective intensity level;Long Term:  Able to use RPE to guide intensity level when  exercising independently       Able to understand and use Dyspnea scale  Yes       Intervention  Provide education and explanation on how to use Dyspnea scale       Expected Outcomes  Short Term: Able to use Dyspnea scale daily in rehab to express subjective sense of shortness of breath during exertion;Long Term: Able to use Dyspnea scale to guide intensity level when exercising independently       Knowledge and understanding of Target Heart Rate Range (THRR)  Yes       Intervention  Provide education and explanation of THRR including how the numbers were predicted and where they are located for reference       Expected Outcomes  Short Term: Able to state/look up THRR;Long Term: Able to use THRR to govern intensity when exercising independently;Short Term: Able to use daily as guideline for intensity in rehab       Able to check pulse independently  Yes       Intervention  Provide education and demonstration on how to check pulse in carotid and radial arteries.;Review the importance of being able to check your own pulse for safety during independent exercise       Expected Outcomes  Short Term: Able to explain why pulse checking is important during independent exercise;Long Term: Able to check pulse independently and accurately       Understanding of Exercise Prescription  Yes        Intervention  Provide education, explanation, and written materials on patient's individual exercise prescription       Expected Outcomes  Short Term: Able to explain program exercise prescription;Long Term: Able to explain home exercise prescription to exercise independently          Exercise Goals Re-Evaluation : Exercise Goals Re-Evaluation    Row Name 02/03/18 1135 02/14/18 1034 02/15/18 1513 02/28/18 1456 03/15/18 1447     Exercise Goal Re-Evaluation   Exercise Goals Review  Understanding of Exercise Prescription;Knowledge and understanding of Target Heart Rate Range (THRR);Able to understand and use rate of perceived exertion (RPE) scale;Able to understand and use Dyspnea scale  Increase Physical Activity;Understanding of Exercise Prescription;Increase Strength and Stamina  Increase Physical Activity;Understanding of Exercise Prescription;Increase Strength and Stamina;Able to understand and use Dyspnea scale;Knowledge and understanding of Target Heart Rate Range (THRR);Able to understand and use rate of perceived exertion (RPE) scale;Able to check pulse independently  Increase Physical Activity;Understanding of Exercise Prescription;Increase Strength and Stamina  Increase Physical Activity;Understanding of Exercise Prescription;Increase Strength and Stamina   Comments  Reviewed RPE scale, THR and program prescription with pt today.  Pt voiced understanding and was given a copy of goals to take home.   Leslie Sims is off to a good start in rehab.  He is already up to level 2 on the recumbent bike and level 3 on the T5 NuStep.  We will continue to monitor his progression.   Reviewed home exercise with pt today.  Pt plans to walk and eventually use elliptical at home for exercise.  Reviewed THR, pulse, RPE, sign and symptoms, NTG use, and when to call 911 or MD.  Also discussed weather considerations and indoor options.  Pt voiced understanding.  Leslie Sims has been doing well in rehab.  He is now up to level  4 on the T5 NuStep and the recumbent bike.  He seems to be enjoying the exercise more this go around.  We will continue to monitor his progression.  Leslie Sims continues to do well in rehab. He is now using 5 lbs handweights and moved up to level 5 on the T5 NuStep. He has also added 0.5% grade to his treadmill. We will continue to monitor his progression.   Expected Outcomes  Short: Use RPE daily to regulate intensity.  Long: Follow program prescription in THR.  Short: Continue to attend classes regularly.  Long: Continue to work on Animator.   Short: Add in at least one extra day of walking at home.  Long: Continue to build strength and stamina.   Short: Continue to increase walking here and at home.  Long: Continue to exercise on off days.   Short: Add more incline to treadmill.  Long: Continue to build strength and stamina.    Supreme Name 03/27/18 1604 04/12/18 1456 04/25/18 1602 05/09/18 1501       Exercise Goal Re-Evaluation   Exercise Goals Review  Increase Physical Activity;Understanding of Exercise Prescription;Increase Strength and Stamina  -  -  -    Comments  Leslie Sims has been doing well in rehab.  He is now up to 2.5 mph on the treadmill.  We will continue to moniotr his progression.   Out since last review  Out since last review  Out since last review.  Planning to return tomorrow 5/22 or Friday 5/24    Expected Outcomes  Short: Add incline back to treadmill.  Long: Continue to work on exercising at home.   -  -  -       Discharge Exercise Prescription (Final Exercise Prescription Changes): Exercise Prescription Changes - 03/27/18 1600      Response to Exercise   Blood Pressure (Admit)  122/70    Blood Pressure (Exit)  110/60    Heart Rate (Admit)  98 bpm    Heart Rate (Exercise)  109 bpm    Heart Rate (Exit)  92 bpm    Oxygen Saturation (Admit)  98 %    Oxygen Saturation (Exercise)  94 %    Oxygen Saturation (Exit)  94 %    Rating of Perceived Exertion (Exercise)   10    Perceived Dyspnea (Exercise)  2    Symptoms  none    Duration  Continue with 45 min of aerobic exercise without signs/symptoms of physical distress.    Intensity  THRR unchanged      Progression   Progression  Continue to progress workloads to maintain intensity without signs/symptoms of physical distress.    Average METs  2.9      Resistance Training   Training Prescription  Yes    Weight  5 lbs    Reps  10-15      Interval Training   Interval Training  No      Oxygen   Oxygen  Continuous    Liters  2      Treadmill   MPH  2.5    Grade  0    Minutes  15    METs  2.91      Recumbant Bike   Level  5    RPM  59    Watts  34    Minutes  15    METs  3.84      T5 Nustep   Level  4    SPM  89    Minutes  15    METs  2.1      Home Exercise Plan   Plans to continue exercise at  Home (comment) walking, ellipitical    Frequency  Add 2 additional days to program exercise sessions.    Initial Home Exercises Provided  02/15/18       Nutrition:  Target Goals: Understanding of nutrition guidelines, daily intake of sodium <1534m, cholesterol <2058m calories 30% from fat and 7% or less from saturated fats, daily to have 5 or more servings of fruits and vegetables.  Biometrics: Pre Biometrics - 01/24/18 1257      Pre Biometrics   Height  5' 9.5" (1.765 m)    Weight  141 lb 12.8 oz (64.3 kg)    Waist Circumference  35 inches    Hip Circumference  37.25 inches    Waist to Hip Ratio  0.94 %    BMI (Calculated)  20.65        Nutrition Therapy Plan and Nutrition Goals: Nutrition Therapy & Goals - 03/15/18 1227      Nutrition Therapy   Diet  TLC diabetic but does not follow diabetic diet, follows a low sodium diet    Drug/Food Interactions  Statins/Certain Fruits    Protein (specify units)  12-13oz    Fiber  30 grams    Whole Grain Foods  3 servings    Saturated Fats  16 max. grams    Fruits and Vegetables  2 servings/day does not regularly consume them; 8  servings ideal     Sodium  1500 grams      Personal Nutrition Goals   Nutrition Goal  Increase intake of fruits and vegetables to at least 1 serving per day    Personal Goal #2  Do not skip meals! Eat on a regular schedule and consume snacks between meals when possible. Focus on protein-rich meals and snacks    Personal Goal #3  Consume Premier Protein shakes more consistently, at least 1/day ideally    Comments  patient reports recent wt loss of 10# and is struggling to maintain weight. consumes seafood, pork chops, beans, eggs, dairy and peanut butter for protein      Intervention Plan   Intervention  Prescribe, educate and counsel regarding individualized specific dietary modifications aiming towards targeted core components such as weight, hypertension, lipid management, diabetes, heart failure and other comorbidities.;Nutrition handout(s) given to patient. Nutition therapy for COPD handout provided    Expected Outcomes  Short Term Goal: Understand basic principles of dietary content, such as calories, fat, sodium, cholesterol and nutrients.;Short Term Goal: A plan has been developed with personal nutrition goals set during dietitian appointment.;Long Term Goal: Adherence to prescribed nutrition plan.       Nutrition Assessments: Nutrition Assessments - 01/24/18 1224      MEDFICTS Scores   Pre Score  38       Nutrition Goals Re-Evaluation: Nutrition Goals Re-Evaluation    Row Name 02/15/18 1525 03/03/18 1205 03/15/18 1231 03/15/18 1232       Goals   Current Weight  139 lb (63 kg)  144 lb (65.3 kg)  -  -    Nutrition Goal  Meet with dietician to gain weight  Meet with dietician to gain weight, eat healthier  Increase intake of fruits and vegetables, ideally working up to consuming each food group at least daily  Do not skip meals; eat on a regular schedule and utilize PrAutoZonerotein shakes as needed for extra protein and calories    Comment  Leslie Sims would like to gain weight.  He  wants to learn to eat right but still gain  some weight.  He has an appointment scheduled to meet with dietician next week.  Leslie Sims meets with the dietician next week. He wants to gain a little bit of weight. Informed him that his diet is important to his breathing. Patient verbalizes understanding.  He reports intake of fruits and vegetables to be low at this time  Patient reports difficulty maintaining weight, recent wt loss of 10# per his report, decreased appetite    Expected Outcome  Short: Meet with dietician.  Long: Continue to follow recommendations.   Short: Meet with dietician.  Long: Continue to follow recommendations of dietician.   Patient will consume both fruits and vegetables daily  Patient will maintain CBW or gain weight        Nutrition Goals Discharge (Final Nutrition Goals Re-Evaluation): Nutrition Goals Re-Evaluation - 03/15/18 1232      Goals   Nutrition Goal  Do not skip meals; eat on a regular schedule and utilize Premier Protein shakes as needed for extra protein and calories    Comment  Patient reports difficulty maintaining weight, recent wt loss of 10# per his report, decreased appetite    Expected Outcome  Patient will maintain CBW or gain weight        Psychosocial: Target Goals: Acknowledge presence or absence of significant depression and/or stress, maximize coping skills, provide positive support system. Participant is able to verbalize types and ability to use techniques and skills needed for reducing stress and depression.   Initial Review & Psychosocial Screening: Initial Psych Review & Screening - 01/24/18 1251      Initial Review   Comments  Leslie Sims states that he tries to get out of the house, away from home,at least 30 min every day.   He does have  a CAT Scan due soon for followup of nodules found earlier in his chest.  Biopsy did indicate not cancer.       Family Dynamics   Good Support System?  -- Vanderbilt has his 2 dogs at home that are great  companions to him       Quality of Life Scores:  Scores of 19 and below usually indicate a poorer quality of life in these areas.  A difference of  2-3 points is a clinically meaningful difference.  A difference of 2-3 points in the total score of the Quality of Life Index has been associated with significant improvement in overall quality of life, self-image, physical symptoms, and general health in studies assessing change in quality of life.  PHQ-9: Recent Review Flowsheet Data    Depression screen [facility] 2/9 05/22/2018 04/19/2018 03/03/2018 01/24/2018 01/09/2018   Decreased Interest 2 3 3 3 3    Down, Depressed, Hopeless 2 3 3 3 3    PHQ - 2 Score 4 6 6 6 6    Altered sleeping 1 3 3 3 3    Tired, decreased energy 1 3 3 3 3    Change in appetite 1 3 1 3 3    Feeling bad or failure about yourself  1 3 1 1 2    Trouble concentrating 0 1 1 3 2    Moving slowly or fidgety/restless 0 0 1 3 0   Suicidal thoughts 0 0 1 0 0   PHQ-9 Score 8 19 17 22 19    Difficult doing work/chores - - - Extremely dIfficult -     Interpretation of Total Score  Total Score Depression Severity:  1-4 = Minimal depression, 5-9 = Mild depression, 10-14 = Moderate depression, 15-19 = Moderately  severe depression, 20-27 = Severe depression   Psychosocial Evaluation and Intervention: Psychosocial Evaluation - 02/08/18 1219      Psychosocial Evaluation & Interventions   Comments  Mr. Carducci [facility]) has returned to this Pulmonary Rehab program following a hospitalization recently for an upper lung infection.  He is a 67 year old who suffers with multiple health issues including COPD.  Alphonsus has a strong support system with his daughter close by and his local church family.  He sadly lost his primary support spouse this past July and is grieving that loss currently.  Deshun reports chronic sleeping problems and has lost his appetite recently.  He has a history of depression and anxiety and reports these have both worsened with  the loss of his spouse, brother and sister in the past several years.  He is on medication to help with this and reports it is working "somewhat."  Trampas is attending Grief share support groups with his daughter and states this is helping as well.  Bryceson's PHQ-9 is "22" indicating severe depression currently.  Counselor discussed this with him and encouraged a medication evaluation if this doesn't improve significantly in the next few weeks.  Stepan has goals to breathe better; be off the oxygen during the day and improve his health and be able to return to more normal activities.  He will be followed by staff and this counselor throughout the course of this program.      Expected Outcomes  Ezrael will begin to exercise more consistently to help with his goals to breathe better and also to help with his depression/anxiety symptoms.  The educational and psychoeducational components of this program will help him understand and cope more positively with his condition. Qadir will have a medication evaluation to address his mood symptoms in the near future.      Continue Psychosocial Services   Follow up required by counselor       Psychosocial Re-Evaluation: Psychosocial Re-Evaluation    [place] Name 03/13/18 1224             Psychosocial Re-Evaluation   Current issues with  Current Sleep Concerns;Current Psychotropic Meds;Current Anxiety/Panic;Current Depression       Comments  Counselor follow up with Leslie Sims today.  He was tearful and reported "not sure what's going on."  He denies HI/SI at this time.  He states his anxiety has increased as he is afraid of going too far from home; afraid of increased shortness of breath; and is afraid of not being able to breathe when he takes a shower - opens window and shower curtain.  His medications for anxiety and depression have recently been changed and he continues to struggle.  Kyuss is attending grief-share for his complicated grief subsequent to the  loss of his spouse 9 months ago.  He reports not sleeping well and no interest in things he used to enjoy.  Jaylend is meeting with a therapist tomorrow for his initial intake appointment.  He is being followed on his medications by his PCP.  This counselor suggested a psychiatrist since he has had so many negative reactions to psychotropic medications.  Counselor provided a name and number for a local psychiatrist.  Alison states he feels better once he works out.  He also thinks part of the problem yesterday was he forgot his nicotene patch in the morning.  Counselor will continue to provide support and Nycere will meet with his counselor; call his daughter for support; and contact a psychiatrist  for medication evaluation.         Expected Outcomes  [place] will contact a psychiatrist; meet with a new therapist tomorrow; and call his Daughter for support during this time.  He also will continue to attend the Grief support group.  LT - to continue to work on smoking cessation and exercise for improved breathing and health.       Interventions  Relaxation education;Physician referral;Encouraged to attend Pulmonary Rehabilitation for the exercise       Continue Psychosocial Services   Follow up required by counselor          Psychosocial Discharge (Final Psychosocial Re-Evaluation): Psychosocial Re-Evaluation - 03/13/18 1224      Psychosocial Re-Evaluation   Current issues with  Current Sleep Concerns;Current Psychotropic Meds;Current Anxiety/Panic;Current Depression    Comments  Counselor follow up with [place] today.  He was tearful and reported "not sure what's going on."  He denies HI/SI at this time.  He states his anxiety has increased as he is afraid of going too far from home; afraid of increased shortness of breath; and is afraid of not being able to breathe when he takes a shower - opens window and shower curtain.  His medications for anxiety and depression have recently been changed and  he continues to struggle.  Skylur is attending grief-share for his complicated grief subsequent to the loss of his spouse 9 months ago.  He reports not sleeping well and no interest in things he used to enjoy.  Chord is meeting with a therapist tomorrow for his initial intake appointment.  He is being followed on his medications by his PCP.  This counselor suggested a psychiatrist since he has had so many negative reactions to psychotropic medications.  Counselor provided a name and number for a local psychiatrist.  Leslie Sims states he feels better once he works out.  He also thinks part of the problem yesterday was he forgot his nicotene patch in the morning.  Counselor will continue to provide support and Julius will meet with his counselor; call his daughter for support; and contact a psychiatrist for medication evaluation.      Expected Outcomes  [place] will contact a psychiatrist; meet with a new therapist tomorrow; and call his Daughter for support during this time.  He also will continue to attend the Grief support group.  LT - to continue to work on smoking cessation and exercise for improved breathing and health.    Interventions  Relaxation education;Physician referral;Encouraged to attend Pulmonary Rehabilitation for the exercise    Continue Psychosocial Services   Follow up required by counselor       Education: Education Goals: Education classes will be provided on a weekly basis, covering required topics. Participant will state understanding/return demonstration of topics presented.  Learning Barriers/Preferences: Learning Barriers/Preferences - 01/24/18 1237      Learning Barriers/Preferences   Learning Barriers  None    Learning Preferences  Verbal Instruction;Video       Education Topics:  Initial Evaluation Education: - Verbal, written and demonstration of respiratory meds, oximetry and breathing techniques. Instruction on use of nebulizers and MDIs and importance of  monitoring MDI activations.   Pulmonary Rehab from 03/31/2018 in [facility] Cardiac and Pulmonary Rehab  Date  01/24/18  Educator  SB  Instruction Review Code  1- Verbalizes Understanding      General Nutrition Guidelines/Fats and Fiber: -Group instruction provided by verbal, written material, models and posters to present the general  guidelines for heart healthy nutrition. Gives an explanation and review of dietary fats and fiber.   Pulmonary Rehab from 03/31/2018 in [facility] Cardiac and Pulmonary Rehab  Date  03/13/18  Educator  CR  Instruction Review Code  1- Verbalizes Understanding      Controlling Sodium/Reading Food Labels: -Group verbal and written material supporting the discussion of sodium use in heart healthy nutrition. Review and explanation with models, verbal and written materials for utilization of the food label.   Pulmonary Rehab from 03/31/2018 in [facility] Cardiac and Pulmonary Rehab  Date  03/20/18  Educator  CR  Instruction Review Code  1- Verbalizes Understanding      Exercise Physiology & General Exercise Guidelines: - Group verbal and written instruction with models to review the exercise physiology of the cardiovascular system and associated critical values. Provides general exercise guidelines with specific guidelines to those with heart or lung disease.    Aerobic Exercise & Resistance Training: - Gives group verbal and written instruction on the various components of exercise. Focuses on aerobic and resistive training programs and the benefits of this training and how to safely progress through these programs.   Flexibility, Balance, Mind/Body Relaxation: Provides group verbal/written instruction on the benefits of flexibility and balance training, including mind/body exercise modes such as yoga, pilates and tai chi.  Demonstration and skill practice provided.   Stress and Anxiety: - Provides group verbal and written instruction about the health risks of elevated  stress and causes of high stress.  Discuss the correlation between heart/lung disease and anxiety and treatment options. Review healthy ways to manage with stress and anxiety.   Pulmonary Rehab from 03/31/2018 in [facility] Cardiac and Pulmonary Rehab  Date  02/22/18  Educator  [facility]  Instruction Review Code  1- Verbalizes Understanding      Depression: - Provides group verbal and written instruction on the correlation between heart/lung disease and depressed mood, treatment options, and the stigmas associated with seeking treatment.   Pulmonary Rehab from 03/31/2018 in [facility] Cardiac and Pulmonary Rehab  Date  02/08/18  Educator  [facility]  Instruction Review Code  1- Verbalizes Understanding      Exercise & Equipment Safety: - Individual verbal instruction and demonstration of equipment use and safety with use of the equipment.   Pulmonary Rehab from 03/31/2018 in [facility] Cardiac and Pulmonary Rehab  Date  01/24/18  Educator  Sb  Instruction Review Code  1- Verbalizes Understanding      Infection Prevention: - Provides verbal and written material to individual with discussion of infection control including proper hand washing and proper equipment cleaning during exercise session.   Pulmonary Rehab from 03/31/2018 in [facility] Cardiac and Pulmonary Rehab  Date  01/24/18  Educator  Sb  Instruction Review Code  1- Verbalizes Understanding      Falls Prevention: - Provides verbal and written material to individual with discussion of falls prevention and safety.   Pulmonary Rehab from 03/31/2018 in [facility] Cardiac and Pulmonary Rehab  Date  01/24/18  Educator  SB  Instruction Review Code  1- Verbalizes Understanding      Diabetes: - Individual verbal and written instruction to review signs/symptoms of diabetes, desired ranges of glucose level fasting, after meals and with exercise. Advice that pre and post exercise glucose checks will be done for 3 sessions at entry of program.   Chronic Lung Diseases: -  Group verbal and written instruction to review updates, respiratory medications, advancements in procedures and treatments. Discuss use of supplemental  oxygen including available portable oxygen systems, continuous and intermittent flow rates, concentrators, personal use and safety guidelines. Review proper use of inhaler and spacers. Provide informative websites for self-education.    Pulmonary Rehab from 03/31/2018 in [facility] Cardiac and Pulmonary Rehab  Date  03/15/18  Educator  [facility]  Instruction Review Code  1- Verbalizes Understanding      Energy Conservation: - Provide group verbal and written instruction for methods to conserve energy, plan and organize activities. Instruct on pacing techniques, use of adaptive equipment and posture/positioning to relieve shortness of breath.   Pulmonary Rehab from 03/31/2018 in [facility] Cardiac and Pulmonary Rehab  Date  02/15/18  Educator  [facility]  Instruction Review Code  1- Verbalizes Understanding      Triggers and Exacerbations: - Group verbal and written instruction to review types of environmental triggers and ways to prevent exacerbations. Discuss weather changes, air quality and the benefits of nasal washing. Review warning signs and symptoms to help prevent infections. Discuss techniques for effective airway clearance, coughing, and vibrations.   AED/CPR: - Group verbal and written instruction with the use of models to demonstrate the basic use of the AED with the basic ABC's of resuscitation.   Pulmonary Rehab from 03/31/2018 in [facility] Cardiac and Pulmonary Rehab  Date  02/17/18  Educator  [facility]  Instruction Review Code  1- Actuary and Physiology of the Lungs: - Group verbal and written instruction with the use of models to provide basic lung anatomy and physiology related to function, structure and complications of lung disease.   Pulmonary Rehab from 03/31/2018 in [facility] Cardiac and Pulmonary Rehab  Date  03/01/18  Educator   [facility]  Instruction Review Code  1- Verbalizes Understanding      Anatomy & Physiology of the Heart: - Group verbal and written instruction and models provide basic cardiac anatomy and physiology, with the coronary electrical and arterial systems. Review of Valvular disease and Heart Failure   Cardiac Medications: - Group verbal and written instruction to review commonly prescribed medications for heart disease. Reviews the medication, class of the drug, and side effects.   Pulmonary Rehab from 03/31/2018 in [facility] Cardiac and Pulmonary Rehab  Date  02/03/18  Educator  [facility]  Instruction Review Code  1- Verbalizes Understanding      Know Your Numbers and Risk Factors: -Group verbal and written instruction about important numbers in your health.  Discussion of what are risk factors and how they play a role in the disease process.  Review of Cholesterol, Blood Pressure, Diabetes, and BMI and the role they play in your overall health.   Pulmonary Rehab from 03/31/2018 in [facility] Cardiac and Pulmonary Rehab  Date  03/31/18  Educator  [facility]  Instruction Review Code  1- Verbalizes Understanding      Sleep Hygiene: -Provides group verbal and written instruction about how sleep can affect your health.  Define sleep hygiene, discuss sleep cycles and impact of sleep habits. Review good sleep hygiene tips.    Other: -Provides group and verbal instruction on various topics (see comments)    Knowledge Questionnaire Score: Knowledge Questionnaire Score - 01/24/18 1237      Knowledge Questionnaire Score   Pre Score  15/18 Reviewed correct responses with Raza. He verbalized understading of the correct response. He had no questions today        Core Components/Risk Factors/Patient Goals at Admission: Personal Goals and Risk Factors at Admission - 01/24/18 1226  Core Components/Risk Factors/Patient Goals on Admission    Weight Management  Yes    Intervention  Weight Management: Develop a  combined nutrition and exercise program designed to reach desired caloric intake, while maintaining appropriate intake of nutrient and fiber, sodium and fats, and appropriate energy expenditure required for the weight goal.;Weight Management: Provide education and appropriate resources to help participant work on and attain dietary goals.;Weight Management/Obesity: Establish reasonable short term and long term weight goals.    Admit Weight  141 lb 12.8 oz (64.3 kg)    Goal Weight: Short Term  145 lb (65.8 kg)    Goal Weight: Long Term  145 lb (65.8 kg)    Expected Outcomes  Short Term: Continue to assess and modify interventions until short term weight is achieved;Long Term: Adherence to nutrition and physical activity/exercise program aimed toward attainment of established weight goal;Weight Maintenance: Understanding of the daily nutrition guidelines, which includes 25-35% calories from fat, 7% or less cal from saturated fats, less than 213m cholesterol, less than 1.5gm of sodium, & 5 or more servings of fruits and vegetables daily;Understanding recommendations for meals to include 15-35% energy as protein, 25-35% energy from fat, 35-60% energy from carbohydrates, less than 2083mof dietary cholesterol, 20-35 gm of total fiber daily;Understanding of distribution of calorie intake throughout the day with the consumption of 4-5 meals/snacks    Tobacco Cessation  Yes    Intervention  Assist the participant in steps to quit. Provide individualized education and counseling about committing to Tobacco Cessation, relapse prevention, and pharmacological support that can be provided by physician.;OfAdvice workerassist with locating and accessing local/national Quit Smoking programs, and support quit date choice. Given smoking cessation packet. Reviewed need to see the counselor to help him with the smoking cessation journey.    Expected Outcomes  Short Term: Will demonstrate readiness to quit, by  selecting a quit date.;Long Term: Complete abstinence from all tobacco products for at least 12 months from quit date.;Short Term: Will quit all tobacco product use, adhering to prevention of relapse plan.    Improve shortness of breath with ADL's  Yes    Intervention  Provide education, individualized exercise plan and daily activity instruction to help decrease symptoms of SOB with activities of daily living.    Expected Outcomes  Short Term: Improve cardiorespiratory fitness to achieve a reduction of symptoms when performing ADLs;Long Term: Be able to perform more ADLs without symptoms or delay the onset of symptoms    Heart Failure  Yes    Intervention  Provide a combined exercise and nutrition program that is supplemented with education, support and counseling about heart failure. Directed toward relieving symptoms such as shortness of breath, decreased exercise tolerance, and extremity edema.    Expected Outcomes  Improve functional capacity of life;Short term: Attendance in program 2-3 days a week with increased exercise capacity. Reported lower sodium intake. Reported increased fruit and vegetable intake. Reports medication compliance.;Short term: Daily weights obtained and reported for increase. Utilizing diuretic protocols set by physician.;Long term: Adoption of self-care skills and reduction of barriers for early signs and symptoms recognition and intervention leading to self-care maintenance.    Hypertension  Yes    Intervention  Provide education on lifestyle modifcations including regular physical activity/exercise, weight management, moderate sodium restriction and increased consumption of fresh fruit, vegetables, and low fat dairy, alcohol moderation, and smoking cessation.;Monitor prescription use compliance.    Expected Outcomes  Short Term: Continued assessment and intervention until BP is <  140/20m HG in hypertensive participants. < 130/870mHG in hypertensive participants with  diabetes, heart failure or chronic kidney disease.;Long Term: Maintenance of blood pressure at goal levels.    Lipids  Yes    Intervention  Provide education and support for participant on nutrition & aerobic/resistive exercise along with prescribed medications to achieve LDL <7016mHDL >40m47m  Expected Outcomes  Short Term: Participant states understanding of desired cholesterol values and is compliant with medications prescribed. Participant is following exercise prescription and nutrition guidelines.;Long Term: Cholesterol controlled with medications as prescribed, with individualized exercise RX and with personalized nutrition plan. Value goals: LDL < 70mg69mL > 40 mg.    Stress  Yes    Intervention  Offer individual and/or small group education and counseling on adjustment to heart disease, stress management and health-related lifestyle change. Teach and support self-help strategies.;Refer participants experiencing significant psychosocial distress to appropriate mental health specialists for further evaluation and treatment. When possible, include family members and significant others in education/counseling sessions. Loss of three family members in the past year. One being his wife of 37 ye43s.  Continues to grieve for his wife.Is attending grief counseling program.     Expected Outcomes  Short Term: Participant demonstrates changes in health-related behavior, relaxation and other stress management skills, ability to obtain effective social support, and compliance with psychotropic medications if prescribed.;Long Term: Emotional wellbeing is indicated by absence of clinically significant psychosocial distress or social isolation.       Core Components/Risk Factors/Patient Goals Review:  Goals and Risk Factor Review    Row Name 02/15/18 1514 03/03/18 1151           Core Components/Risk Factors/Patient Goals Review   Personal Goals Review  Weight Management/Obesity;Heart  Failure;Hypertension;Tobacco Cessation;Lipids;Improve shortness of breath with ADL's  Weight Management/Obesity;Heart Failure;Hypertension;Tobacco Cessation;Lipids;Improve shortness of breath with ADL's      Review  JohnnAvrohombeen doing well in rehab.  His weight has been holding steady but he would like to gain weight.  His goal is to get back up to 145 lbs but he wants to build up muscle.  He has an appointment with the dietician during class next week to help with weight gain.  He is doing a little better with his shortness of breath but there are some days that it is worse than others.   He continues to work on his heart failure and has not had any symptoms.  He monitors his blood pressures at home at least 3-4x a week and has it checked in class as well.  He is doing well on his medications.   For tobacco cessation he is getting better.  He is staying between 6-18 cigarettes a day versus a full pack.  He is also set up to start a one on one smoking cessation class since the current classes were already in progress and did not restart until April.  He has strated to make the right steps.  We talked a little about combination therapy as he would not like to take Chantix again.  I also gave him a fake cigarette to hold and use in lieu of a real one.  We will continue to monitor his tobacco use.  Leslie Sims attending class regularly and has been working hard to stop smoking. He started wearing the patch 2 weeks ago. He still smokes about 16-18 cigarettes a day. His blood pressure has been withing normal limits which he checks at home. His shortness of  breath is getting a little better but somedays are rougher than others. He has yet to go to the smoking cessation class on March 19th.      Expected Outcomes  Short: Start smoking cessation classes.  Long: Continue to work on weight gain.   Short: attened smoking cessation class on the 19th. Long: quit smoking.         Core Components/Risk Factors/Patient  Goals at Discharge (Final Review):  Goals and Risk Factor Review - 03/03/18 1151      Core Components/Risk Factors/Patient Goals Review   Personal Goals Review  Weight Management/Obesity;Heart Failure;Hypertension;Tobacco Cessation;Lipids;Improve shortness of breath with ADL's    Review  Leslie Sims has been attending class regularly and has been working hard to stop smoking. He started wearing the patch 2 weeks ago. He still smokes about 16-18 cigarettes a day. His blood pressure has been withing normal limits which he checks at home. His shortness of breath is getting a little better but somedays are rougher than others. He has yet to go to the smoking cessation class on March 19th.    Expected Outcomes  Short: attened smoking cessation class on the 19th. Long: quit smoking.       ITP Comments: ITP Comments    Row Name 01/24/18 1218 01/30/18 0936 02/24/18 1227 02/27/18 0838 03/27/18 0901   ITP Comments  Medical review completed today. ITP to Dr Sabra Heck for review, changes as needed and signature.   Documentation of diagnosis can be found in [facility] Encounter 12/25/2017.  New admission. Did not complete program last year, because his wife became very sick and died in July 29, 2023.   30 day review completed. ITP sent to Dr. Emily Filbert Director of [place]. Continue with ITP unless changes are made by physician.  Leslie Sims daughter delivered his Nicotine patches during class. He is very motivated to quit smoking and has an appointment with a [employer] rep in a couple weeks   30 day review completed. ITP sent to Dr. Emily Filbert Director of [place]. Continue with ITP unless changes are made by physician.   30 day review completed. ITP sent to Dr. Emily Filbert Director of [place]. Continue with ITP unless changes are made by physician   Row Name 04/12/18 1456 04/24/18 0853 04/25/18 1601 05/09/18 1500 05/22/18 0839   ITP Comments  Called Leslie Sims to check on status of return.  He has been out after having a difficult time  breathing with the pollen. He was in the ED on Monday.  He was started on new medication and is trying to get a follow up appointment with his PCP and will return when he is able.  He wants to finish the program this time!   30 day review completed. ITP sent to Dr. Emily Filbert Director of [place]. Continue with ITP unless changes are made by physician  Out since last reveiw, still recovering  Called to check on status of return to rehab.  He said that he is feeling better and ready to get back. He is going to aim to return tomorrow or Friday.     30 day review completed. ITP sent to Dr. Emily Filbert Director of [place]. Continue with ITP unless changes are made by physician   [place] Name 06/07/18 1514           ITP Comments  Called to check on Leslie Sims.  He has been out since April.  He continues to have ongoing pulmonary issues.  Left message stating that we will discharge  him at this time. Discharge ITP and summary created and sent for review.           Comments: Discharge ITP

## 2018-06-07 NOTE — Progress Notes (Unsigned)
Discharge Progress Report  Patient Details  Name: Leslie Sims MRN: [id number] Date of Birth: Aug 10, 1951 Referring Provider:     Pulmonary Rehab from 01/24/2018 in [name]  Referring Provider  [name]       Number of Visits: 22/36  Reason for Discharge:  Early Exit:  Ongoing health issues  Smoking History:  Social History   Tobacco Use  Smoking Status Current Every Day Smoker  . Packs/day: 0.50  . Years: 42.00  . Pack years: 21.00  . Types: Cigarettes  Smokeless Tobacco Never Used  Tobacco Comment   02/15/18 For tobacco cessation he is getting better.  He is staying between 6-18 cigarettes a day versus a full pack.  He is also set up to start a one on one smoking cessation class since the current classes were already in progress and did not restart un    Diagnosis:  Chronic obstructive pulmonary disease, unspecified COPD type ([place])  ADL UCSD: Pulmonary Assessment Scores    Row Name 01/24/18 1247         ADL UCSD   ADL Phase  Entry New Entry numbers 01/24/2018     SOB Score total  86     Rest  2     Walk  4     Stairs  5     Bath  3     Dress  4     Shop  5       CAT Score   CAT Score  28       mMRC Score   mMRC Score  1        Initial Exercise Prescription: Initial Exercise Prescription - 01/24/18 1300      Date of Initial Exercise RX and Referring Provider   Date  01/24/18    Referring Provider  [name]      Oxygen   Oxygen  Continuous    Liters  2      Treadmill   MPH  2.4    Grade  1.4    Minutes  15    METs  3.3      Recumbant Bike   Level  1    RPM  60    Watts  25    Minutes  15    METs  3.3      T5 Nustep   Level  2    SPM  80    Minutes  15    METs  3.3      Prescription Details   Frequency (times per week)  3    Duration  Progress to 45 minutes of aerobic exercise without signs/symptoms of physical distress      Intensity   THRR 40-80% of Max Heartrate  118-143    Ratings of Perceived  Exertion  11-13    Perceived Dyspnea  0-4      Resistance Training   Training Prescription  Yes    Weight  3 lb    Reps  10-15       Discharge Exercise Prescription (Final Exercise Prescription Changes): Exercise Prescription Changes - 03/27/18 1600      Response to Exercise   Blood Pressure (Admit)  122/70    Blood Pressure (Exit)  110/60    Heart Rate (Admit)  98 bpm    Heart Rate (Exercise)  109 bpm    Heart Rate (Exit)  92 bpm    Oxygen Saturation (Admit)  98 %  Oxygen Saturation (Exercise)  94 %    Oxygen Saturation (Exit)  94 %    Rating of Perceived Exertion (Exercise)  10    Perceived Dyspnea (Exercise)  2    Symptoms  none    Duration  Continue with 45 min of aerobic exercise without signs/symptoms of physical distress.    Intensity  THRR unchanged      Progression   Progression  Continue to progress workloads to maintain intensity without signs/symptoms of physical distress.    Average METs  2.9      Resistance Training   Training Prescription  Yes    Weight  5 lbs    Reps  10-15      Interval Training   Interval Training  No      Oxygen   Oxygen  Continuous    Liters  2      Treadmill   MPH  2.5    Grade  0    Minutes  15    METs  2.91      Recumbant Bike   Level  5    RPM  59    Watts  34    Minutes  15    METs  3.84      T5 Nustep   Level  4    SPM  89    Minutes  15    METs  2.1      Home Exercise Plan   Plans to continue exercise at  Home (comment) walking, ellipitical    Frequency  Add 2 additional days to program exercise sessions.    Initial Home Exercises Provided  02/15/18       Functional Capacity: 6 Minute Walk    Row Name 01/24/18 1258         6 Minute Walk   Distance  1000 feet     Walk Time  4.75 minutes     # of Rest Breaks  1     MPH  2.4     METS  3.4     RPE  11     Perceived Dyspnea   2     VO2 Peak  11.9     Symptoms  No     Resting HR  93 bpm     Resting BP  106/72     Resting Oxygen Saturation    95 %     Exercise Oxygen Saturation  during 6 min walk  80 %     Max Ex. HR  123 bpm     Max Ex. BP  132/64     2 Minute Post BP  108/64       Interval HR   1 Minute HR  106     2 Minute HR  104     3 Minute HR  102     4 Minute HR  123     6 Minute HR  120     2 Minute Post HR  97     Interval Heart Rate?  Yes       Interval Oxygen   Interval Oxygen?  Yes     Baseline Oxygen Saturation %  95 %     1 Minute Oxygen Saturation %  82 %     1 Minute Liters of Oxygen  2 L     2 Minute Oxygen Saturation %  82 %     2 Minute Liters of Oxygen  2 L  3 Minute Oxygen Saturation %  81 %     3 Minute Liters of Oxygen  2 L     4 Minute Oxygen Saturation %  83 %     4 Minute Liters of Oxygen  2 L     5 Minute Oxygen Saturation %  81 %     5 Minute Liters of Oxygen  2 L     6 Minute Oxygen Saturation %  80 %     6 Minute Liters of Oxygen  2 L     2 Minute Post Oxygen Saturation %  95 %     2 Minute Post Liters of Oxygen  2 L        Psychological, QOL, Others - Outcomes: PHQ 2/9: Depression screen [facility] 2/9 05/22/2018 04/19/2018 03/03/2018 01/24/2018 01/09/2018  Decreased Interest 2 3 3 3 3   Down, Depressed, Hopeless 2 3 3 3 3   PHQ - 2 Score 4 6 6 6 6   Altered sleeping 1 3 3 3 3   Tired, decreased energy 1 3 3 3 3   Change in appetite 1 3 1 3 3   Feeling bad or failure about yourself  1 3 1 1 2   Trouble concentrating 0 1 1 3 2   Moving slowly or fidgety/restless 0 0 1 3 0  Suicidal thoughts 0 0 1 0 0  PHQ-9 Score 8 19 17 22 19   Difficult doing work/chores - - - Extremely dIfficult -  Some recent data might be hidden    Quality of Life:   Personal Goals: Goals established at orientation with interventions provided to work toward goal. Personal Goals and Risk Factors at Admission - 01/24/18 1226      Core Components/Risk Factors/Patient Goals on Admission    Weight Management  Yes    Intervention  Weight Management: Develop a combined nutrition and exercise program designed to reach  desired caloric intake, while maintaining appropriate intake of nutrient and fiber, sodium and fats, and appropriate energy expenditure required for the weight goal.;Weight Management: Provide education and appropriate resources to help participant work on and attain dietary goals.;Weight Management/Obesity: Establish reasonable short term and long term weight goals.    Admit Weight  141 lb 12.8 oz (64.3 kg)    Goal Weight: Short Term  145 lb (65.8 kg)    Goal Weight: Long Term  145 lb (65.8 kg)    Expected Outcomes  Short Term: Continue to assess and modify interventions until short term weight is achieved;Long Term: Adherence to nutrition and physical activity/exercise program aimed toward attainment of established weight goal;Weight Maintenance: Understanding of the daily nutrition guidelines, which includes 25-35% calories from fat, 7% or less cal from saturated fats, less than 200mg  cholesterol, less than 1.5gm of sodium, & 5 or more servings of fruits and vegetables daily;Understanding recommendations for meals to include 15-35% energy as protein, 25-35% energy from fat, 35-60% energy from carbohydrates, less than 200mg  of dietary cholesterol, 20-35 gm of total fiber daily;Understanding of distribution of calorie intake throughout the day with the consumption of 4-5 meals/snacks    Tobacco Cessation  Yes    Intervention  Assist the participant in steps to quit. Provide individualized education and counseling about committing to Tobacco Cessation, relapse prevention, and pharmacological support that can be provided by physician.;Advice worker, assist with locating and accessing local/national Quit Smoking programs, and support quit date choice. Given smoking cessation packet. Reviewed need to see the counselor to help him with the smoking cessation journey.  Expected Outcomes  Short Term: Will demonstrate readiness to quit, by selecting a quit date.;Long Term: Complete abstinence from  all tobacco products for at least 12 months from quit date.;Short Term: Will quit all tobacco product use, adhering to prevention of relapse plan.    Improve shortness of breath with ADL's  Yes    Intervention  Provide education, individualized exercise plan and daily activity instruction to help decrease symptoms of SOB with activities of daily living.    Expected Outcomes  Short Term: Improve cardiorespiratory fitness to achieve a reduction of symptoms when performing ADLs;Long Term: Be able to perform more ADLs without symptoms or delay the onset of symptoms    Heart Failure  Yes    Intervention  Provide a combined exercise and nutrition program that is supplemented with education, support and counseling about heart failure. Directed toward relieving symptoms such as shortness of breath, decreased exercise tolerance, and extremity edema.    Expected Outcomes  Improve functional capacity of life;Short term: Attendance in program 2-3 days a week with increased exercise capacity. Reported lower sodium intake. Reported increased fruit and vegetable intake. Reports medication compliance.;Short term: Daily weights obtained and reported for increase. Utilizing diuretic protocols set by physician.;Long term: Adoption of self-care skills and reduction of barriers for early signs and symptoms recognition and intervention leading to self-care maintenance.    Hypertension  Yes    Intervention  Provide education on lifestyle modifcations including regular physical activity/exercise, weight management, moderate sodium restriction and increased consumption of fresh fruit, vegetables, and low fat dairy, alcohol moderation, and smoking cessation.;Monitor prescription use compliance.    Expected Outcomes  Short Term: Continued assessment and intervention until BP is < 140/84mm HG in hypertensive participants. < 130/37mm HG in hypertensive participants with diabetes, heart failure or chronic kidney disease.;Long Term:  Maintenance of blood pressure at goal levels.    Lipids  Yes    Intervention  Provide education and support for participant on nutrition & aerobic/resistive exercise along with prescribed medications to achieve LDL 70mg , HDL >40mg .    Expected Outcomes  Short Term: Participant states understanding of desired cholesterol values and is compliant with medications prescribed. Participant is following exercise prescription and nutrition guidelines.;Long Term: Cholesterol controlled with medications as prescribed, with individualized exercise RX and with personalized nutrition plan. Value goals: LDL < 70mg , HDL > 40 mg.    Stress  Yes    Intervention  Offer individual and/or small group education and counseling on adjustment to heart disease, stress management and health-related lifestyle change. Teach and support self-help strategies.;Refer participants experiencing significant psychosocial distress to appropriate mental health specialists for further evaluation and treatment. When possible, include family members and significant others in education/counseling sessions. Loss of three family members in the past year. One being his wife of 20 years.  Continues to grieve for his wife.Is attending grief counseling program.     Expected Outcomes  Short Term: Participant demonstrates changes in health-related behavior, relaxation and other stress management skills, ability to obtain effective social support, and compliance with psychotropic medications if prescribed.;Long Term: Emotional wellbeing is indicated by absence of clinically significant psychosocial distress or social isolation.        Personal Goals Discharge: Goals and Risk Factor Review    Row Name 02/15/18 1514 03/03/18 1151           Core Components/Risk Factors/Patient Goals Review   Personal Goals Review  Weight Management/Obesity;Heart Failure;Hypertension;Tobacco Cessation;Lipids;Improve shortness of breath with ADL's  Weight  Management/Obesity;Heart  Failure;Hypertension;Tobacco Cessation;Lipids;Improve shortness of breath with ADL's      Review  Demonte has been doing well in rehab.  His weight has been holding steady but he would like to gain weight.  His goal is to get back up to 145 lbs but he wants to build up muscle.  He has an appointment with the dietician during class next week to help with weight gain.  He is doing a little better with his shortness of breath but there are some days that it is worse than others.   He continues to work on his heart failure and has not had any symptoms.  He monitors his blood pressures at home at least 3-4x a week and has it checked in class as well.  He is doing well on his medications.   For tobacco cessation he is getting better.  He is staying between 6-18 cigarettes a day versus a full pack.  He is also set up to start a one on one smoking cessation class since the current classes were already in progress and did not restart until April.  He has strated to make the right steps.  We talked a little about combination therapy as he would not like to take Chantix again.  I also gave him a fake cigarette to hold and use in lieu of a real one.  We will continue to monitor his tobacco use.  Tamela Oddi has been attending class regularly and has been working hard to stop smoking. He started wearing the patch 2 weeks ago. He still smokes about 16-18 cigarettes a day. His blood pressure has been withing normal limits which he checks at home. His shortness of breath is getting a little better but somedays are rougher than others. He has yet to go to the smoking cessation class on March 19th.      Expected Outcomes  Short: Start smoking cessation classes.  Long: Continue to work on weight gain.   Short: attened smoking cessation class on the 19th. Long: quit smoking.         Exercise Goals and Review: Exercise Goals    Row Name 01/24/18 1257             Exercise Goals   Increase Physical  Activity  Yes       Intervention  Provide advice, education, support and counseling about physical activity/exercise needs.;Develop an individualized exercise prescription for aerobic and resistive training based on initial evaluation findings, risk stratification, comorbidities and participant's personal goals.       Expected Outcomes  Short Term: Attend rehab on a regular basis to increase amount of physical activity.;Long Term: Add in home exercise to make exercise part of routine and to increase amount of physical activity.;Long Term: Exercising regularly at least 3-5 days a week.       Increase Strength and Stamina  Yes       Intervention  Provide advice, education, support and counseling about physical activity/exercise needs.;Develop an individualized exercise prescription for aerobic and resistive training based on initial evaluation findings, risk stratification, comorbidities and participant's personal goals.       Expected Outcomes  Short Term: Increase workloads from initial exercise prescription for resistance, speed, and METs.;Short Term: Perform resistance training exercises routinely during rehab and add in resistance training at home;Long Term: Improve cardiorespiratory fitness, muscular endurance and strength as measured by increased METs and functional capacity (6MWT)       Able to understand and use rate of perceived exertion (  RPE) scale  Yes       Intervention  Provide education and explanation on how to use RPE scale       Expected Outcomes  Short Term: Able to use RPE daily in rehab to express subjective intensity level;Long Term:  Able to use RPE to guide intensity level when exercising independently       Able to understand and use Dyspnea scale  Yes       Intervention  Provide education and explanation on how to use Dyspnea scale       Expected Outcomes  Short Term: Able to use Dyspnea scale daily in rehab to express subjective sense of shortness of breath during exertion;Long  Term: Able to use Dyspnea scale to guide intensity level when exercising independently       Knowledge and understanding of Target Heart Rate Range (THRR)  Yes       Intervention  Provide education and explanation of THRR including how the numbers were predicted and where they are located for reference       Expected Outcomes  Short Term: Able to state/look up THRR;Long Term: Able to use THRR to govern intensity when exercising independently;Short Term: Able to use daily as guideline for intensity in rehab       Able to check pulse independently  Yes       Intervention  Provide education and demonstration on how to check pulse in carotid and radial arteries.;Review the importance of being able to check your own pulse for safety during independent exercise       Expected Outcomes  Short Term: Able to explain why pulse checking is important during independent exercise;Long Term: Able to check pulse independently and accurately       Understanding of Exercise Prescription  Yes       Intervention  Provide education, explanation, and written materials on patient's individual exercise prescription       Expected Outcomes  Short Term: Able to explain program exercise prescription;Long Term: Able to explain home exercise prescription to exercise independently          Nutrition & Weight - Outcomes: Pre Biometrics - 01/24/18 1257      Pre Biometrics   Height  5' 9.5" (1.765 m)    Weight  141 lb 12.8 oz (64.3 kg)    Waist Circumference  35 inches    Hip Circumference  37.25 inches    Waist to Hip Ratio  0.94 %    BMI (Calculated)  20.65        Nutrition: Nutrition Therapy & Goals - 03/15/18 1227      Nutrition Therapy   Diet  TLC diabetic but does not follow diabetic diet, follows a low sodium diet    Drug/Food Interactions  Statins/Certain Fruits    Protein (specify units)  12-13oz    Fiber  30 grams    Whole Grain Foods  3 servings    Saturated Fats  16 max. grams    Fruits and  Vegetables  2 servings/day does not regularly consume them; 8 servings ideal     Sodium  1500 grams      Personal Nutrition Goals   Nutrition Goal  Increase intake of fruits and vegetables to at least 1 serving per day    Personal Goal #2  Do not skip meals! Eat on a regular schedule and consume snacks between meals when possible. Focus on protein-rich meals and snacks    Personal Goal #3  Consume Premier Protein shakes more consistently, at least 1/day ideally    Comments  patient reports recent wt loss of 10# and is struggling to maintain weight. consumes seafood, pork chops, beans, eggs, dairy and peanut butter for protein      Intervention Plan   Intervention  Prescribe, educate and counsel regarding individualized specific dietary modifications aiming towards targeted core components such as weight, hypertension, lipid management, diabetes, heart failure and other comorbidities.;Nutrition handout(s) given to patient. Nutition therapy for COPD handout provided    Expected Outcomes  Short Term Goal: Understand basic principles of dietary content, such as calories, fat, sodium, cholesterol and nutrients.;Short Term Goal: A plan has been developed with personal nutrition goals set during dietitian appointment.;Long Term Goal: Adherence to prescribed nutrition plan.       Nutrition Discharge: Nutrition Assessments - 01/24/18 1224      MEDFICTS Scores   Pre Score  38       Education Questionnaire Score: Knowledge Questionnaire Score - 01/24/18 1237      Knowledge Questionnaire Score   Pre Score  15/18 Reviewed correct responses with Sandip. He verbalized understading of the correct response. He had no questions today       Goals reviewed with patient; copy given to patient.

## 2018-06-09 ENCOUNTER — Ambulatory Visit: Payer: BLUE CROSS/BLUE SHIELD | Admitting: Psychology

## 2018-06-09 DIAGNOSIS — F411 Generalized anxiety disorder: Secondary | ICD-10-CM

## 2018-06-11 ENCOUNTER — Other Ambulatory Visit: Payer: Self-pay | Admitting: Family Medicine

## 2018-06-12 NOTE — Telephone Encounter (Signed)
Refill request Lorazepam 1 mg   LOV 05/22/2018 DR Tamala Julian COPD  Last Filled  03/16/2018 one - half tab q 6 hrs prn  60 tabs  2 refills  Dr Tamala Julian  Pharmacy on File

## 2018-06-16 ENCOUNTER — Ambulatory Visit: Payer: BLUE CROSS/BLUE SHIELD | Admitting: Psychology

## 2018-06-16 DIAGNOSIS — F331 Major depressive disorder, recurrent, moderate: Secondary | ICD-10-CM | POA: Diagnosis not present

## 2018-06-19 ENCOUNTER — Ambulatory Visit: Payer: Self-pay | Admitting: *Deleted

## 2018-06-19 NOTE — Telephone Encounter (Signed)
Patient is using Robitussin DM max for cough- for couple days now.Patient states he had a reaction to new antidepressant and that exacerbated his COPD. He is having a hard time recovering- he has a dry cough and is still having trouble with his breathing.  Reason for Disposition . [1] MILD difficulty breathing (e.g., minimal/no SOB at rest, SOB with walking, pulse <100) AND [2] NEW-onset or WORSE than normal  Answer Assessment - Initial Assessment Questions 1. RESPIRATORY STATUS: "Describe your breathing?" (e.g., wheezing, shortness of breath, unable to speak, severe coughing)      O2- 97-     Dry cough 2. ONSET: "When did this breathing problem begin?"      Reaction to antidepressant- 1 week ago 3. PATTERN "Does the difficult breathing come and go, or has it been constant since it started?"      Has improved a little- getting enough breath in- patient is running on 2 at night and 1 during the day. 4. SEVERITY: "How bad is your breathing?" (e.g., mild, moderate, severe)    - MILD: No SOB at rest, mild SOB with walking, speaks normally in sentences, can lay down, no retractions, pulse < 100.    - MODERATE: SOB at rest, SOB with minimal exertion and prefers to sit, cannot lie down flat, speaks in phrases, mild retractions, audible wheezing, pulse 100-120.    - SEVERE: Very SOB at rest, speaks in single words, struggling to breathe, sitting hunched forward, retractions, pulse > 120      moderate 5. RECURRENT SYMPTOM: "Have you had difficulty breathing before?" If so, ask: "When was the last time?" and "What happened that time?"      Yes- but not with dry hacking cough 6. CARDIAC HISTORY: "Do you have any history of heart disease?" (e.g., heart attack, angina, bypass surgery, angioplasty)      Stints- 2 7. LUNG HISTORY: "Do you have any history of lung disease?"  (e.g., pulmonary embolus, asthma, emphysema)     COPD 8. CAUSE: "What do you think is causing the breathing problem?"      Residual  effects from reaction to medication 9. OTHER SYMPTOMS: "Do you have any other symptoms? (e.g., dizziness, runny nose, cough, chest pain, fever)     Was- all that has improved- some vision blurriness  10. PREGNANCY: "Is there any chance you are pregnant?" "When was your last menstrual period?"       n/a 11. TRAVEL: "Have you traveled out of the country in the last month?" (e.g., travel history, exposures)       n/a  Protocols used: BREATHING DIFFICULTY-A-AH

## 2018-06-20 ENCOUNTER — Ambulatory Visit: Payer: Self-pay | Admitting: Urgent Care

## 2018-06-22 NOTE — Telephone Encounter (Signed)
Spoke to pt.  He went to [facility] in [place].  They stated Wellbutrin was causing his SOB. Advised pt to stop.  Pt attempted to call MD who prescribed Wellbutrin in [place] and they are closed for the remainder of the week.  He states he was thinking about taking some Zoloft he had taken prior to being put on Wellbutrin.  I advised him NOT to take Zoloft  As he may have to wean on and off that med.  Pt does have Ativan and an inhaler.  He will stay on Ativan every 6 hours as prescribed by Dr. Tamala Julian and his inhaler.  He states his 02 sat is 71 and he believes his depression and anxiety is the cause.   Advised to stay in out of the heat and humidity.  Pt verbalized understanding.  He will call the MD office in [facility] about his Wellbutrin on Monday am.

## 2018-06-24 ENCOUNTER — Other Ambulatory Visit: Payer: Self-pay | Admitting: Family Medicine

## 2018-06-24 MED ORDER — IPRATROPIUM-ALBUTEROL 0.5-2.5 (3) MG/3ML IN SOLN: 3.0000 mL | RESPIRATORY_TRACT | 3 refills | Status: DC | PRN

## 2018-06-24 MED ORDER — MIRTAZAPINE 30 MG PO TABS: 30.0000 mg | ORAL_TABLET | Freq: Every day | ORAL | 5 refills | Status: DC

## 2018-06-25 NOTE — Telephone Encounter (Signed)
Spoke with patient on 06/22/18:  1.  Suffering with worsening SOB yet pulse oximetry is in high 90s (97%) on room air; minimal wheezing.  Patient feels that SOB is due to anxiety.  Evaluated by Dr. Nolon Rod at [facility] urgent care; she advised him to stop Wellbutrin as she felt it was causing tachycardia, shaking, and perceived SOB.  Patient stopped Wellbutrin on 06/20/18 yet is a nervous wreck.  Has called Dr. Waylan Boga office yet office is closed for holiday.  Has also spoken with therapist.  Taking Remeron 30mg  at bedtime; needs refill.  Also taking Lorazepam 1mg  twice daily; advised by RN Kalman Shan to take another 1/2 this afternoon and did.  A/P: Anxiety and depression: uncontrolled; restart Wellbutrin XL 150mg  daily; restart Zoloft 50mg  daily; continue Remeron 30mg  at bedtime.  Continue Lorazepam 1mg  bid.  Call Dr. Nicolasa Ducking of psychiatry on Monday, 06/26/18 for appointment and further recommendations.  COPD: stable pulse oximetry; refill of duoneb sent to [facility]. Close follow-up.

## 2018-07-02 ENCOUNTER — Emergency Department: Payer: BLUE CROSS/BLUE SHIELD

## 2018-07-02 ENCOUNTER — Other Ambulatory Visit: Payer: Self-pay

## 2018-07-02 ENCOUNTER — Encounter: Payer: Self-pay | Admitting: *Deleted

## 2018-07-02 ENCOUNTER — Observation Stay
Admission: EM | Admit: 2018-07-02 | Discharge: 2018-07-03 | Disposition: A | Discharge status: Home / Self Care | Payer: BLUE CROSS/BLUE SHIELD | Attending: Internal Medicine | Admitting: Internal Medicine

## 2018-07-02 DIAGNOSIS — E785 Hyperlipidemia, unspecified: Secondary | ICD-10-CM | POA: Insufficient documentation

## 2018-07-02 DIAGNOSIS — Z8711 Personal history of peptic ulcer disease: Secondary | ICD-10-CM | POA: Insufficient documentation

## 2018-07-02 DIAGNOSIS — I5022 Chronic systolic (congestive) heart failure: Secondary | ICD-10-CM | POA: Insufficient documentation

## 2018-07-02 DIAGNOSIS — Z86010 Personal history of colonic polyps: Secondary | ICD-10-CM | POA: Diagnosis not present

## 2018-07-02 DIAGNOSIS — R079 Chest pain, unspecified: Secondary | ICD-10-CM | POA: Diagnosis present

## 2018-07-02 DIAGNOSIS — Z955 Presence of coronary angioplasty implant and graft: Secondary | ICD-10-CM | POA: Insufficient documentation

## 2018-07-02 DIAGNOSIS — I110 Hypertensive heart disease with heart failure: Secondary | ICD-10-CM | POA: Diagnosis not present

## 2018-07-02 DIAGNOSIS — J441 Chronic obstructive pulmonary disease with (acute) exacerbation: Secondary | ICD-10-CM | POA: Diagnosis not present

## 2018-07-02 DIAGNOSIS — K219 Gastro-esophageal reflux disease without esophagitis: Secondary | ICD-10-CM | POA: Insufficient documentation

## 2018-07-02 DIAGNOSIS — Z9889 Other specified postprocedural states: Secondary | ICD-10-CM | POA: Diagnosis not present

## 2018-07-02 DIAGNOSIS — I255 Ischemic cardiomyopathy: Secondary | ICD-10-CM | POA: Diagnosis not present

## 2018-07-02 DIAGNOSIS — F17200 Nicotine dependence, unspecified, uncomplicated: Secondary | ICD-10-CM | POA: Insufficient documentation

## 2018-07-02 DIAGNOSIS — Z79899 Other long term (current) drug therapy: Secondary | ICD-10-CM | POA: Diagnosis not present

## 2018-07-02 DIAGNOSIS — I252 Old myocardial infarction: Secondary | ICD-10-CM | POA: Insufficient documentation

## 2018-07-02 DIAGNOSIS — M791 Myalgia, unspecified site: Secondary | ICD-10-CM | POA: Insufficient documentation

## 2018-07-02 DIAGNOSIS — I25110 Atherosclerotic heart disease of native coronary artery with unstable angina pectoris: Principal | ICD-10-CM | POA: Insufficient documentation

## 2018-07-02 DIAGNOSIS — R0789 Other chest pain: Secondary | ICD-10-CM | POA: Diagnosis present

## 2018-07-02 DIAGNOSIS — Z888 Allergy status to other drugs, medicaments and biological substances status: Secondary | ICD-10-CM | POA: Diagnosis not present

## 2018-07-02 DIAGNOSIS — G4700 Insomnia, unspecified: Secondary | ICD-10-CM | POA: Insufficient documentation

## 2018-07-02 HISTORY — DX: ST elevation (STEMI) myocardial infarction involving left anterior descending coronary artery: I21.02

## 2018-07-02 HISTORY — DX: Ischemic cardiomyopathy: I25.5

## 2018-07-02 LAB — BASIC METABOLIC PANEL
Anion gap: 8 (ref 5–15)
BUN: 12 mg/dL (ref 8–23)
CHLORIDE: 99 mmol/L (ref 98–111)
CO2: 33 mmol/L — AB (ref 22–32)
CREATININE: 0.84 mg/dL (ref 0.61–1.24)
Calcium: 9.2 mg/dL (ref 8.9–10.3)
GFR calc non Af Amer: >60 mL/min (ref >60)
GLUCOSE: 103 mg/dL — AB (ref 70–99)
Potassium: 3.9 mmol/L (ref 3.5–5.1)
Sodium: 140 mmol/L (ref 135–145)

## 2018-07-02 LAB — CBC
HCT: 40.7 % (ref 40.0–52.0)
Hemoglobin: 13.9 g/dL (ref 13.0–18.0)
MCH: 30.5 pg (ref 26.0–34.0)
MCHC: 34.1 g/dL (ref 32.0–36.0)
MCV: 89.3 fL (ref 80.0–100.0)
PLATELETS: 350 10*3/uL (ref 150–440)
RBC: 4.56 MIL/uL (ref 4.40–5.90)
RDW: 13.6 % (ref 11.5–14.5)
WBC: 8.9 10*3/uL (ref 3.8–10.6)

## 2018-07-02 LAB — TROPONIN I
Troponin I: <0.03 ng/mL (ref <0.03)
Troponin I: <0.03 ng/mL (ref <0.03)

## 2018-07-02 LAB — PROTIME-INR
INR: 0.89
Prothrombin Time: 12 seconds (ref 11.4–15.2)

## 2018-07-02 IMAGING — DX DG CHEST 1V PORT
2 series · 2 of 2 positions shown · non-contrast
Comparison: Chest CT [DATE]

CLINICAL DATA: Left anterior chest discomfort this afternoon.
History of 2 cardiac stents 10 years ago. Positive smoking history..

EXAM:
PORTABLE CHEST 1 VIEW

[chest ap (1 of 2)]
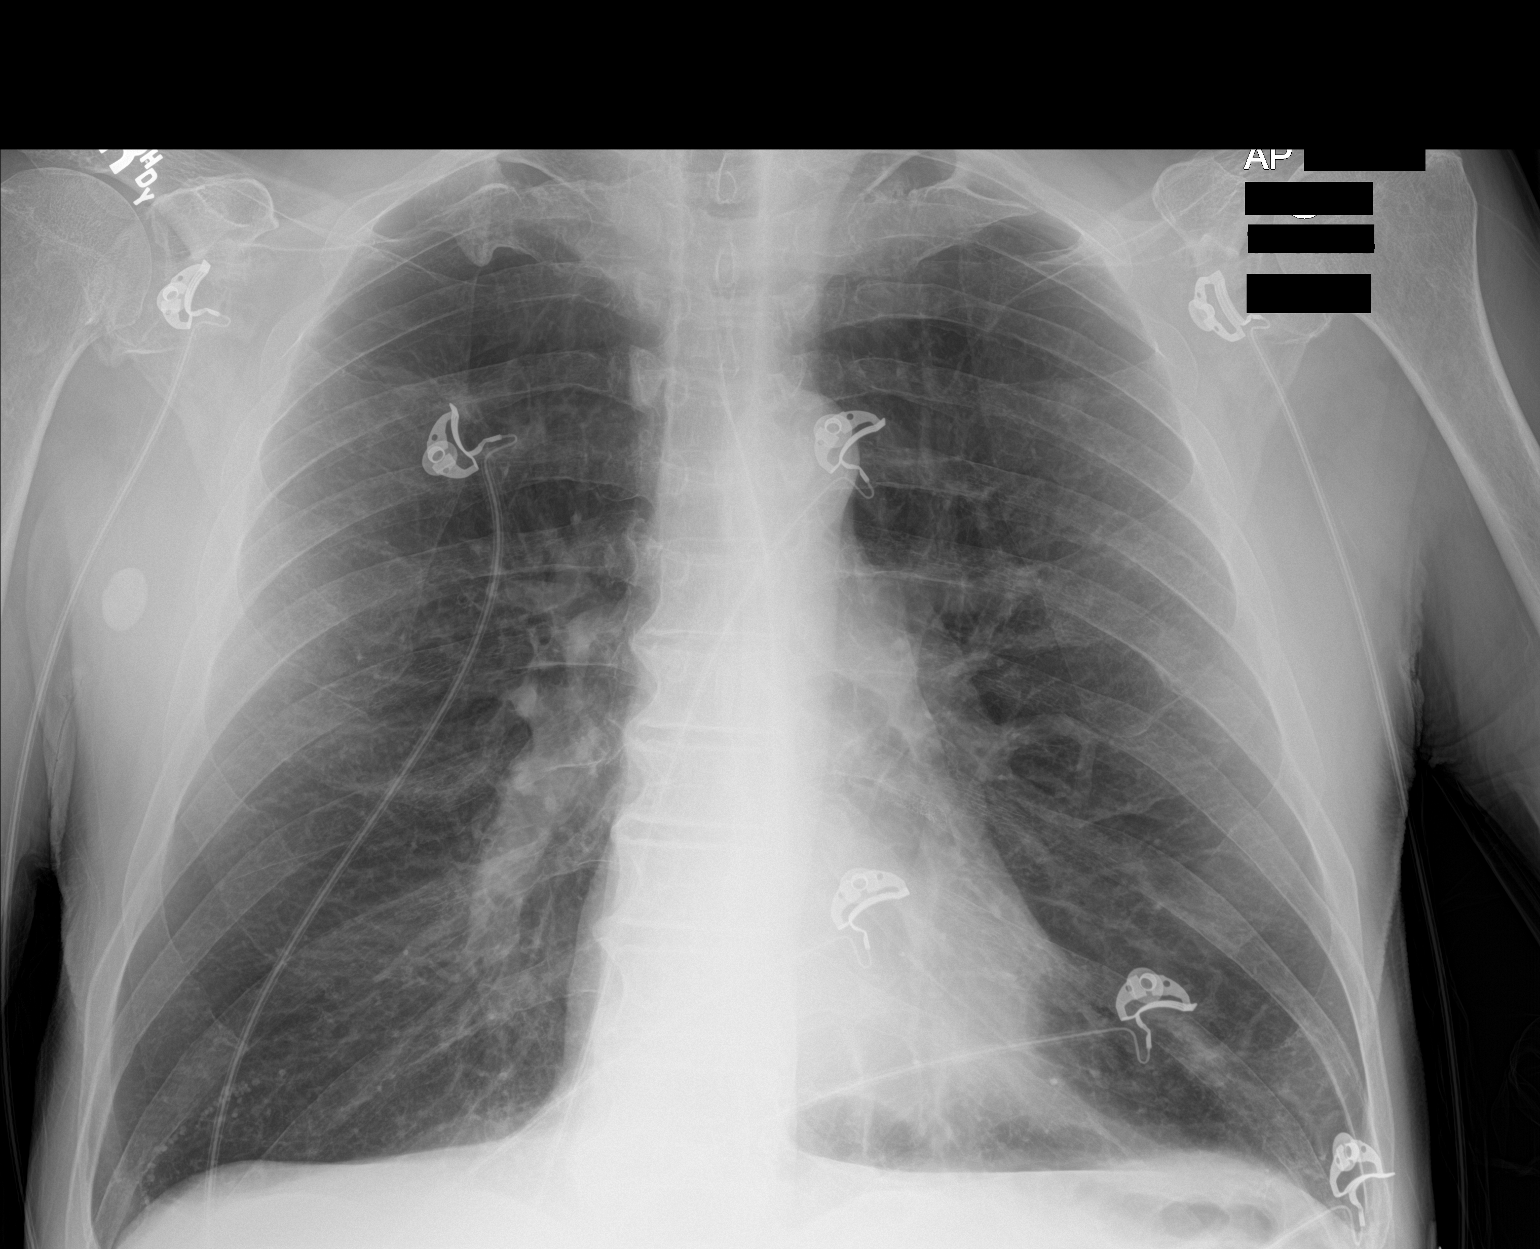

[chest ap (2 of 2)]
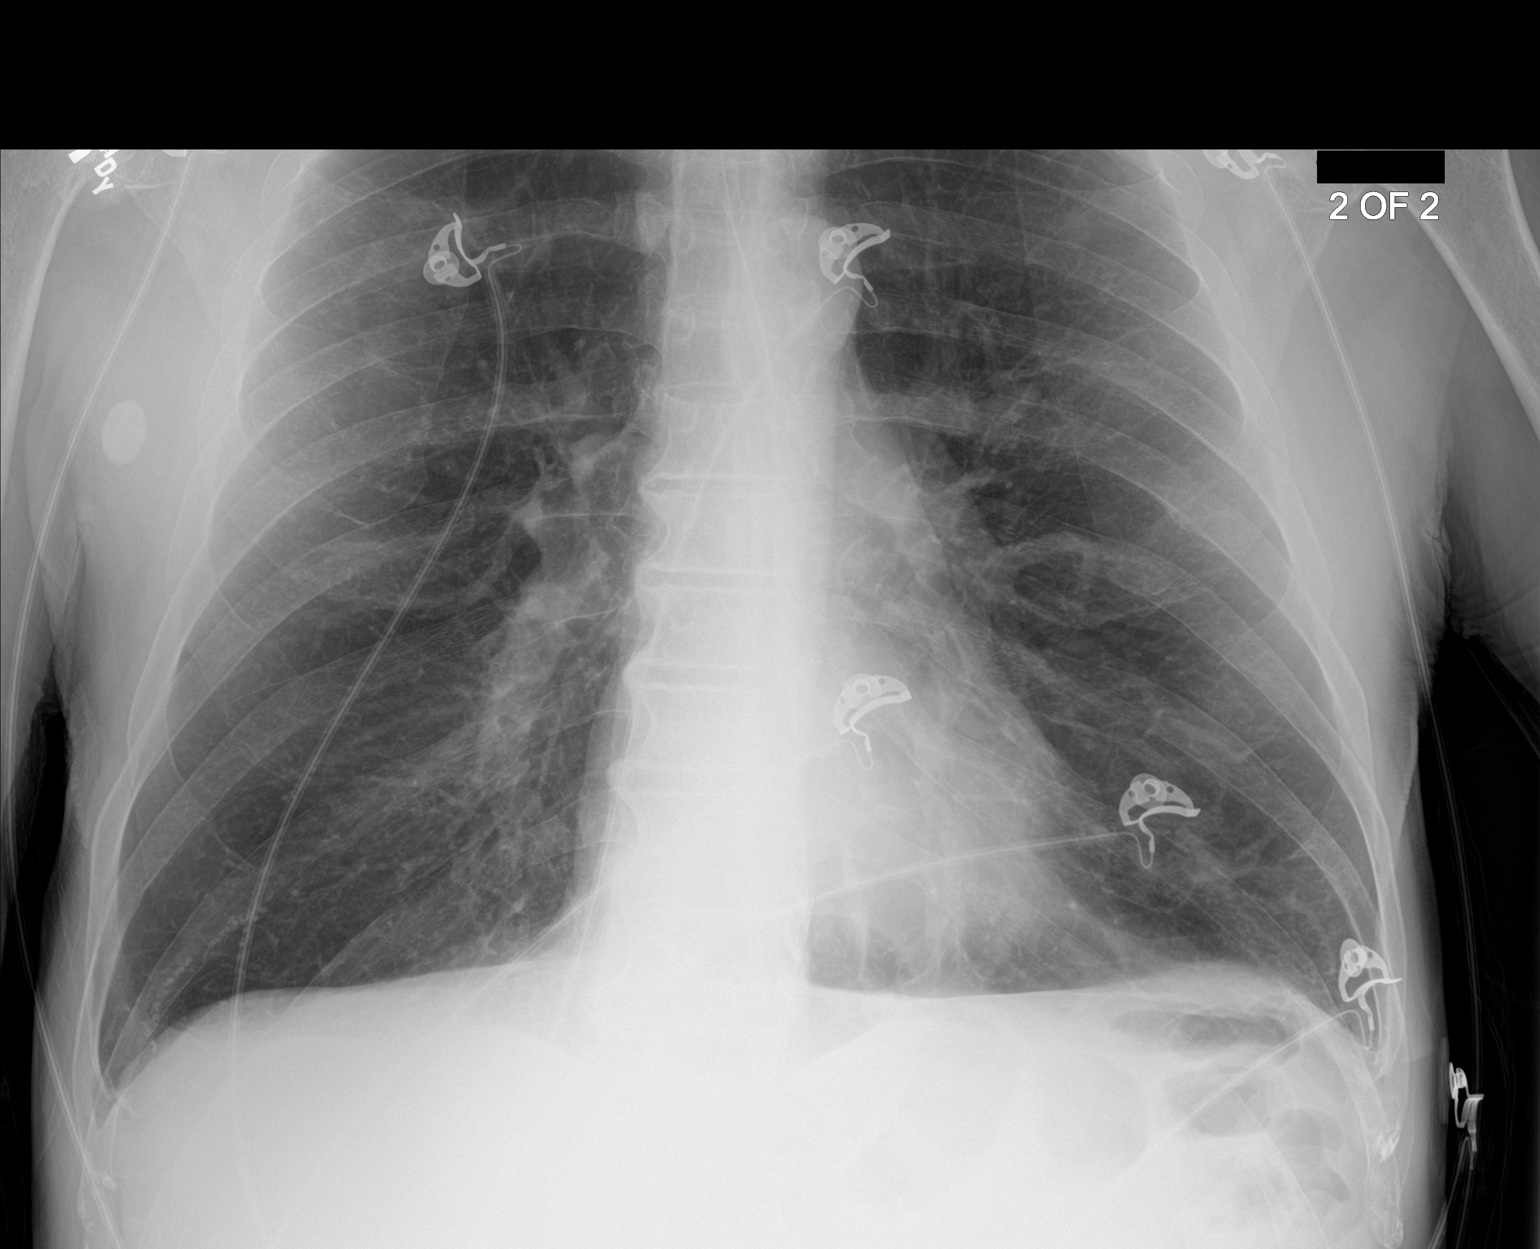

[2 of 2 positions shown; findings below may reference images not displayed]

FINDINGS: Emphysematous hyperinflation of the lungs. Redemonstration of left
upper lobe nodule measuring approximately 12 mm radiographically,
unchanged from prior CT. Heart and mediastinal contours are within
normal limits. There is aortic atherosclerosis without aneurysmal
dilatation. No active pulmonary consolidation, effusion or
pneumothorax. Osteoarthritis of both shoulders involving the AC and
glenohumeral joints. Mild degenerative change along the thoracic
spine.
IMPRESSION: Emphysematous hyperinflation of the lungs consistent with COPD.
Radiographically apparent nodule in the left upper lobe measuring 12
mm. Follow-up recommendations as per recent CT where scattered
bilateral pulmonary nodules were noted.

## 2018-07-02 MED ORDER — ONDANSETRON HCL 4 MG PO TABS: 4.0000 mg | ORAL_TABLET | Freq: Four times a day (QID) | ORAL | Status: DC | PRN

## 2018-07-02 MED ORDER — NITROGLYCERIN 2 % TD OINT
0.5000 [in_us] | TOPICAL_OINTMENT | TRANSDERMAL | Status: AC
  Administered 2018-07-02: 0.5 [in_us] via TOPICAL
  Filled 2018-07-02: qty 1

## 2018-07-02 MED ORDER — SODIUM CHLORIDE 0.9% FLUSH
3.0000 mL | Freq: Two times a day (BID) | INTRAVENOUS | Status: DC
  Administered 2018-07-02 – 2018-07-03 (×2): 3 mL via INTRAVENOUS

## 2018-07-02 MED ORDER — ACETAMINOPHEN 650 MG RE SUPP
650.0000 mg | Freq: Four times a day (QID) | RECTAL | Status: DC | PRN
Start: 2018-07-02 — End: 2018-07-03

## 2018-07-02 MED ORDER — ATORVASTATIN CALCIUM 20 MG PO TABS
40.0000 mg | ORAL_TABLET | Freq: Every evening | ORAL | Status: DC
  Administered 2018-07-02 – 2018-07-03 (×2): 40 mg via ORAL
  Filled 2018-07-02 (×2): qty 2

## 2018-07-02 MED ORDER — ASPIRIN EC 81 MG PO TBEC
81.0000 mg | DELAYED_RELEASE_TABLET | Freq: Every day | ORAL | Status: DC
  Filled 2018-07-02: qty 1

## 2018-07-02 MED ORDER — IPRATROPIUM-ALBUTEROL 0.5-2.5 (3) MG/3ML IN SOLN
3.0000 mL | RESPIRATORY_TRACT | Status: DC | PRN
  Administered 2018-07-02 – 2018-07-03 (×2): 3 mL via RESPIRATORY_TRACT
  Filled 2018-07-02: qty 3

## 2018-07-02 MED ORDER — ALBUTEROL SULFATE (2.5 MG/3ML) 0.083% IN NEBU: 2.5000 mg | INHALATION_SOLUTION | RESPIRATORY_TRACT | Status: DC | PRN

## 2018-07-02 MED ORDER — NITROGLYCERIN 0.4 MG SL SUBL: 0.4000 mg | SUBLINGUAL_TABLET | SUBLINGUAL | Status: DC | PRN

## 2018-07-02 MED ORDER — MOMETASONE FURO-FORMOTEROL FUM 200-5 MCG/ACT IN AERO
2.0000 | INHALATION_SPRAY | Freq: Two times a day (BID) | RESPIRATORY_TRACT | Status: DC
  Administered 2018-07-02 – 2018-07-03 (×2): 2 via RESPIRATORY_TRACT
  Filled 2018-07-02: qty 8.8

## 2018-07-02 MED ORDER — FUROSEMIDE 20 MG PO TABS
20.0000 mg | ORAL_TABLET | Freq: Every day | ORAL | Status: DC
  Administered 2018-07-03: 20 mg via ORAL
  Filled 2018-07-02: qty 1

## 2018-07-02 MED ORDER — IPRATROPIUM-ALBUTEROL 0.5-2.5 (3) MG/3ML IN SOLN
3.0000 mL | Freq: Once | RESPIRATORY_TRACT | Status: DC
  Filled 2018-07-02: qty 3

## 2018-07-02 MED ORDER — POLYETHYLENE GLYCOL 3350 17 G PO PACK: 17.0000 g | PACK | Freq: Every day | ORAL | Status: DC | PRN

## 2018-07-02 MED ORDER — ONDANSETRON HCL 4 MG/2ML IJ SOLN: 4.0000 mg | Freq: Four times a day (QID) | INTRAMUSCULAR | Status: DC | PRN

## 2018-07-02 MED ORDER — ACETAMINOPHEN 325 MG PO TABS: 650.0000 mg | ORAL_TABLET | Freq: Four times a day (QID) | ORAL | Status: DC | PRN

## 2018-07-02 MED ORDER — TIOTROPIUM BROMIDE MONOHYDRATE 18 MCG IN CAPS
18.0000 ug | ORAL_CAPSULE | Freq: Every day | RESPIRATORY_TRACT | Status: DC
  Administered 2018-07-03: 18 ug via RESPIRATORY_TRACT
  Filled 2018-07-02: qty 5

## 2018-07-02 MED ORDER — PREDNISONE 50 MG PO TABS
50.0000 mg | ORAL_TABLET | Freq: Every day | ORAL | Status: DC
  Administered 2018-07-02 – 2018-07-03 (×2): 50 mg via ORAL
  Filled 2018-07-02 (×2): qty 1

## 2018-07-02 MED ORDER — LORAZEPAM 0.5 MG PO TABS
0.5000 mg | ORAL_TABLET | Freq: Four times a day (QID) | ORAL | Status: DC | PRN
  Administered 2018-07-03 (×2): 0.5 mg via ORAL
  Filled 2018-07-02 (×2): qty 1

## 2018-07-02 MED ORDER — LORAZEPAM 0.5 MG PO TABS
0.5000 mg | ORAL_TABLET | Freq: Once | ORAL | Status: AC
  Administered 2018-07-02: 0.5 mg via ORAL
  Filled 2018-07-02: qty 1

## 2018-07-02 MED ORDER — ENOXAPARIN SODIUM 40 MG/0.4ML ~~LOC~~ SOLN
40.0000 mg | SUBCUTANEOUS | Status: DC
  Administered 2018-07-02: 40 mg via SUBCUTANEOUS
  Filled 2018-07-02: qty 0.4

## 2018-07-02 MED ORDER — MIRTAZAPINE 15 MG PO TABS
30.0000 mg | ORAL_TABLET | Freq: Every day | ORAL | Status: DC
  Administered 2018-07-02: 30 mg via ORAL
  Filled 2018-07-02: qty 2

## 2018-07-02 NOTE — ED Provider Notes (Signed)
[facility] Emergency Department Provider Note   ____________________________________________   First MD Initiated Contact with Patient 07/02/18 1740     (approximate)  I have reviewed the triage vital signs and the nursing notes.   HISTORY  Chief Complaint Chest Pain    HPI Leslie Sims is a 67 y.o. male in expensing chest discomfort left-sided chest at about 3 PM.  Its come and gone.  Occurred while he was sitting in his house at rest.  No nausea vomiting.  No fevers or chills.  Reports it is improved after taking aspirin at home and is about a 2 out of 10 but was moderate in nature earlier.  Resides in the left side of the chest, somewhat of a pressure sensation but is now quite a bit better.  Reports he is comfortable at this time.  Cardiac history including 2 previous stents.  Reports compliant with medications.  Did not have to take any nitroglycerin today   Past Medical History:  Diagnosis Date  . Allergic rhinitis   . Anxiety   . Atherosclerosis   . Back pain   . Bronchitis    09-14-11  . CARDIOMYOPATHY 03/24/2010   did not tolerate Toprol and Lisinopril.   . Chest pain   . CHF (congestive heart failure) ([place])   . Chronic airway obstruction, not elsewhere classified   . Colon polyps 08/2012   colonoscopy; multiple colon polyps; repeat colonoscopy in one year.  [name] COPD (chronic obstructive pulmonary disease) ([place])   . Coronary artery disease 11/2009   Anterior MI with late presentation 11/2009. Cath showed a 100% proximal LAD occlusion, 99% mid RCA. Had a PCI and 2 DES to both LAD and RCA. EF was 35%. Nuclear stress test 05/13: prior anterior/inferior infarcts without ischemic, EF 43%, cardiac cath in 02/14: Widely patent stents with no other obstructive disease. EF 40%   . Depression   . GERD (gastroesophageal reflux disease)   . Headache(784.0)   . Helicobacter pylori (H. pylori)   . Hemorrhoids   . Hernia    inguinal  .  Hyperlipidemia   . Hypertension   . Insomnia   . MI (myocardial infarction) ([place])   . Myalgia   . Peptic ulcer   . Shortness of breath dyspnea   . Status post dilation of esophageal narrowing 2000  . Thyroid dysfunction   . Tinea pedis   . Wears dentures    partial upper    Patient Active Problem List   Diagnosis Date Noted  . Grief reaction 01/13/2018  . COPD with hypoxia ([place]) 12/25/2017  . Unstable angina ([place])   . Benign neoplasm of ascending colon   . Benign neoplasm of descending colon   . Benign neoplasm of sigmoid colon   . Personal history of colonic polyps   . Pulmonary nodules/lesions, multiple 01/05/2015  . Bruit of left carotid artery 11/04/2014  . Sleep disorder 07/18/2013  . Seasonal and perennial allergic rhinitis 05/29/2013  . Bradycardia 04/20/2011  . SMOKER 07/30/2010  . CAD, NATIVE VESSEL 04/23/2010  . Hyperlipidemia 03/24/2010  . DEPRESSION/ANXIETY 03/24/2010  . Chronic systolic heart failure ([place]) 03/24/2010  . COPD, very severe ([place]) 03/24/2010  . GERD 03/24/2010  . Essential hypertension 11/21/2009    Past Surgical History:  Procedure Laterality Date  . Admission  12/20/2012   COPD exacerbation.  [place].  [name] CARDIAC CATHETERIZATION    . CARDIAC CATHETERIZATION  02/05/13   [facility]  . CARDIAC CATHETERIZATION  10/14  [facility] : patent stents with no change in anatomy. EF: 40$  . CARDIAC CATHETERIZATION Left 11/12/2016   Procedure: Left Heart Cath and Coronary Angiography;  Surgeon: Wellington Hampshire, MD;  Location: [facility];  Service: Cardiovascular;  Laterality: Left;  . COLONOSCOPY    . COLONOSCOPY WITH PROPOFOL N/A 05/18/2016   Procedure: COLONOSCOPY WITH PROPOFOL;  Surgeon: Lucilla Lame, MD;  Location: [facility] ENDOSCOPY;  Service: Endoscopy;  Laterality: N/A;  . CORONARY ANGIOPLASTY WITH STENT PLACEMENT  09/2009   LAD 3.0 X23 mm Xience DES, RCA: 4.0 X 15 mm Xience DES  . ELECTROMAGNETIC NAVIGATION BROCHOSCOPY N/A 10/27/2017   Procedure:  ELECTROMAGNETIC NAVIGATION BRONCHOSCOPY;  Surgeon: Flora Lipps, MD;  Location: [facility] ORS;  Service: Cardiopulmonary;  Laterality: N/A;  . ESOPHAGEAL DILATION    . ESOPHAGOGASTRODUODENOSCOPY  2008  . ESOPHAGOGASTRODUODENOSCOPY  08/20/2012  . HERNIA REPAIR  07/20/2012   L inguinal hernia repair  . PENILE PROSTHESIS IMPLANT      Prior to Admission medications   Medication Sig Start Date End Date Taking? Authorizing Provider  albuterol (VENTOLIN HFA) 108 (90 Base) MCG/ACT inhaler USE 2 INHALATIONS EVERY 6 HOURS AS NEEDED FOR WHEEZING 08/19/17   [name], MD  aspirin 81 MG tablet Take 81 mg by mouth daily.      [provider]  atorvastatin (LIPITOR) 40 MG tablet Take 1 tablet (40 mg total) by mouth daily. 03/03/18   [name], MD  buPROPion (WELLBUTRIN XL) 150 MG 24 hr tablet TAKE ONE TABLET BY MOUTH EVERY MORNING WITH BREAKFAST 05/03/18   [provider]  Fluticasone-Salmeterol (ADVAIR DISKUS) 500-50 MCG/DOSE AEPB Inhale 1 puff into the lungs 2 (two) times daily. 07/27/17   [name], MD  furosemide (LASIX) 20 MG tablet Take 1 tablet (20 mg total) by mouth daily. 12/30/17 02/10/18  Max Sane, MD  ipratropium-albuterol (DUONEB) 0.5-2.5 (3) MG/3ML SOLN Take 3 mLs by nebulization every 4 (four) hours as needed. 06/24/18   [name], MD  LORazepam (ATIVAN) 1 MG tablet TAKE 1/2 TABLET BY MOUTH EVERY 6 HOURS AS NEEDED FOR ANXIETY 06/14/18   [name], MD  losartan (COZAAR) 25 MG tablet TAKE 1 TABLET DAILY 01/27/18   Minna Merritts, MD  mirtazapine (REMERON) 30 MG tablet Take 1 tablet (30 mg total) by mouth at bedtime. 06/24/18   [name], MD  Multiple Vitamin (MULTIVITAMIN) capsule Take 1 capsule by mouth daily.    [provider]  nitroGLYCERIN (NITROSTAT) 0.4 MG SL tablet Place 1 tablet (0.4 mg total) under the tongue every 5 (five) minutes as needed. 10/17/17   Wellington Hampshire, MD  predniSONE (DELTASONE) 20 MG tablet Take 1 tablet (20 mg total)  by mouth daily with breakfast. 05/16/18   [name], MD  sucralfate (CARAFATE) 1 GM/10ML suspension Take 10 mLs (1 g total) by mouth 4 (four) times daily -  with meals and at bedtime. 05/16/18   [name], MD  tiotropium (SPIRIVA HANDIHALER) 18 MCG inhalation capsule Place 1 capsule (18 mcg total) into inhaler and inhale daily. 07/27/17   [name], MD    Allergies Prozac [fluoxetine hcl]; Effexor xr [venlafaxine hcl er]; and Wellbutrin [bupropion]  Family History  Problem Relation Age of Onset  . Heart attack Brother        Brother #1  . Diabetes Brother   . Hypertension Brother        #3  . Coronary artery disease Father 24  deceased  . Heart attack Father   . Diabetes Father   . Heart disease Father   . COPD Mother 63       deceased  . Alcohol abuse Sister        polysubstance abuse  . COPD Sister   . Lung cancer Sister   . Alcohol abuse Sister        polysubstance abuse  . Penile cancer Brother   . Diabetes Brother     Social History Social History   Tobacco Use  . Smoking status: Current Every Day Smoker    Packs/day: 0.50    Years: 42.00    Pack years: 21.00    Types: Cigarettes  . Smokeless tobacco: Never Used  . Tobacco comment: 02/15/18 For tobacco cessation he is getting better.  He is staying between 6-18 cigarettes a day versus a full pack.  He is also set up to start a one on one smoking cessation class since the current classes were already in progress and did not restart un  Substance Use Topics  . Alcohol use: No    Alcohol/week: 0.0 oz  . Drug use: No    Review of Systems Constitutional: No fever/chills Eyes: No visual changes. ENT: No sore throat. Cardiovascular: See HPI Respiratory: Denies shortness of breath. Gastrointestinal: No abdominal pain.  No nausea, no vomiting.  No diarrhea.  No constipation. Genitourinary: Negative for dysuria. Musculoskeletal: Negative for back pain. Skin: Negative for rash. Neurological:  Negative for headaches, focal weakness or numbness.    ____________________________________________   PHYSICAL EXAM:  VITAL SIGNS: ED Triage Vitals  Enc Vitals Group     BP 07/02/18 1742 134/90     Pulse Rate 07/02/18 1742 86     Resp --      Temp 07/02/18 1742 97.9 F (36.6 C)     Temp Source 07/02/18 1742 Oral     SpO2 07/02/18 1742 94 %     Weight 07/02/18 1750 139 lb (63 kg)     Height 07/02/18 1750 5\' 10"  (1.778 m)     Head Circumference --      Peak Flow --      Pain Score 07/02/18 1750 2     Pain Loc --      Pain Edu? --      Excl. in [place]? --     Constitutional: Alert and oriented. Well appearing and in no acute distress. Eyes: Conjunctivae are normal. Head: Atraumatic. Nose: No congestion/rhinnorhea. Mouth/Throat: Mucous membranes are moist. Neck: No stridor.   Cardiovascular: Normal rate, regular rhythm. Grossly normal heart sounds.  Good peripheral circulation. Respiratory: Normal respiratory effort.  No retractions. Lungs CTAB.  He is barrel chested.  Denies any difficulty breathing.  Speaks in full and clear sentences.  Clear lungs. Gastrointestinal: Soft and nontender. No distention. Musculoskeletal: No lower extremity tenderness nor edema. Neurologic:  Normal speech and language. No gross focal neurologic deficits are appreciated.  Skin:  Skin is warm, dry and intact. No rash noted. Psychiatric: Mood and affect are normal. Speech and behavior are normal.  ____________________________________________   LABS (all labs ordered are listed, but only abnormal results are displayed)  Labs Reviewed  BASIC METABOLIC PANEL - Abnormal; Notable for the following components:      Result Value   CO2 33 (*)    Glucose, Bld 103 (*)    All other components within normal limits  PROTIME-INR  CBC  TROPONIN I   ____________________________________________  EKG  Reviewed  and interpreted by me 1735 Heart rate 90 QRS 90 QTc 450 Normal sinus rhythm, probable old  anterior infarct, no obvious acute new ischemia denoted  EKG appears similar to previous from 05/22/2018, no obvious new acute ischemic changes are denoted ____________________________________________  RADIOLOGY  Dg Chest Port 1 View  Result Date: 07/02/2018 CLINICAL DATA:  Left anterior chest discomfort this afternoon. History of 2 cardiac stents 10 years ago. Positive smoking history. EXAM: PORTABLE CHEST 1 VIEW COMPARISON:  Chest CT 06/01/2018 FINDINGS: Emphysematous hyperinflation of the lungs. Redemonstration of left upper lobe nodule measuring approximately 12 mm radiographically, unchanged from prior CT. Heart and mediastinal contours are within normal limits. There is aortic atherosclerosis without aneurysmal dilatation. No active pulmonary consolidation, effusion or pneumothorax. Osteoarthritis of both shoulders involving the AC and glenohumeral joints. Mild degenerative change along the thoracic spine. IMPRESSION: Emphysematous hyperinflation of the lungs consistent with COPD. Radiographically apparent nodule in the left upper lobe measuring 12 mm. Follow-up recommendations as per recent CT where scattered bilateral pulmonary nodules were noted. Electronically Signed   By: Ashley Royalty M.D.   On: 07/02/2018 18:10    ____________________________________________   PROCEDURES  Procedure(s) performed: None  Procedures  Critical Care performed: No  ____________________________________________   INITIAL IMPRESSION / ASSESSMENT AND PLAN / ED COURSE  Pertinent labs & imaging results that were available during my care of the patient were reviewed by me and considered in my medical decision making (see chart for details).  Differential diagnosis includes, but is not limited to, ACS, aortic dissection, pulmonary embolism, cardiac tamponade, pneumothorax, pneumonia, pericarditis, myocarditis, GI-related causes including esophagitis/gastritis, and musculoskeletal chest wall pain.      ----------------------------------------- 6:54 PM on 07/02/2018 -----------------------------------------  Patient reports pain-free, resting comfortably at this time.  Patient moderate risk by heart score, notable history of coronary disease.  No recent provocative testing to noted.  Patient agreeable and comfortable with plan for admission for observation and further cardiac and chest pain work-up.  Discussed with Dr. Darvin Neighbours, will admit.     ____________________________________________   FINAL CLINICAL IMPRESSION(S) / ED DIAGNOSES  Final diagnoses:  Moderate risk chest pain      NEW MEDICATIONS STARTED DURING THIS VISIT:  New Prescriptions   No medications on file     Note:  This document was prepared using Dragon voice recognition software and may include unintentional dictation errors.     Delman Kitten, MD 07/02/18 [phone]

## 2018-07-02 NOTE — H&P (Addendum)
[facility] NAME: Leslie Sims    MR#:  [id number]  DATE OF BIRTH:  [birth year]  DATE OF ADMISSION:  07/02/2018  PRIMARY CARE PHYSICIAN: [name], MD   REQUESTING/REFERRING PHYSICIAN: Dr. Jacqualine Code  CHIEF COMPLAINT:   Chief Complaint  Patient presents with  . Chest Pain    HISTORY OF PRESENT ILLNESS:  Leslie Sims  is a 67 y.o. male with a known history of CAD, hypertension, tobacco use, COPD, lung nodules, chronic shortness of breath presents to the emergency room due to left-sided chest burning sensation.  Patient has had this burning for many months since having a biopsy of his left lung nodule.  Biopsy resulted in a benign finding.  Today this burning was significantly worse at rest and presented to the emergency room.  Here his EKG shows nothing acute.  Troponin is normal.  Patient is being admitted to rule out acute coronary syndrome due to his history.  Patient did have cardiac catheterization in November 2017 with mild CAD.  Medical management was advised.  Ejection fraction was 35 to 40%. Patient tells me he cannot have a stress test due to his shortness of breath.  PAST MEDICAL HISTORY:   Past Medical History:  Diagnosis Date  . Allergic rhinitis   . Anxiety   . Atherosclerosis   . Back pain   . Bronchitis    09-14-11  . CARDIOMYOPATHY 03/24/2010   did not tolerate Toprol and Lisinopril.   . Chest pain   . CHF (congestive heart failure) ([place])   . Chronic airway obstruction, not elsewhere classified   . Colon polyps 08/2012   colonoscopy; multiple colon polyps; repeat colonoscopy in one year.  [name] COPD (chronic obstructive pulmonary disease) ([place])   . Coronary artery disease 11/2009   Anterior MI with late presentation 11/2009. Cath showed a 100% proximal LAD occlusion, 99% mid RCA. Had a PCI and 2 DES to both LAD and RCA. EF was 35%. Nuclear stress test 05/13: prior anterior/inferior infarcts without ischemic, EF  43%, cardiac cath in 02/14: Widely patent stents with no other obstructive disease. EF 40%   . Depression   . GERD (gastroesophageal reflux disease)   . Headache(784.0)   . Helicobacter pylori (H. pylori)   . Hemorrhoids   . Hernia    inguinal  . Hyperlipidemia   . Hypertension   . Insomnia   . MI (myocardial infarction) ([place])   . Myalgia   . Peptic ulcer   . Shortness of breath dyspnea   . Status post dilation of esophageal narrowing 2000  . Thyroid dysfunction   . Tinea pedis   . Wears dentures    partial upper    PAST SURGICAL HISTORY:   Past Surgical History:  Procedure Laterality Date  . Admission  12/20/2012   COPD exacerbation.  [place].  [name] CARDIAC CATHETERIZATION    . CARDIAC CATHETERIZATION  02/05/13   [facility]  . CARDIAC CATHETERIZATION  10/14   [facility] : patent stents with no change in anatomy. EF: 40$  . CARDIAC CATHETERIZATION Left 11/12/2016   Procedure: Left Heart Cath and Coronary Angiography;  Surgeon: Wellington Hampshire, MD;  Location: [facility];  Service: Cardiovascular;  Laterality: Left;  . COLONOSCOPY    . COLONOSCOPY WITH PROPOFOL N/A 05/18/2016   Procedure: COLONOSCOPY WITH PROPOFOL;  Surgeon: Lucilla Lame, MD;  Location: [facility] ENDOSCOPY;  Service: Endoscopy;  Laterality: N/A;  . CORONARY ANGIOPLASTY WITH STENT PLACEMENT  09/2009   LAD 3.0 X23 mm Xience DES, RCA: 4.0 X 15 mm Xience DES  . ELECTROMAGNETIC NAVIGATION BROCHOSCOPY N/A 10/27/2017   Procedure: ELECTROMAGNETIC NAVIGATION BRONCHOSCOPY;  Surgeon: Flora Lipps, MD;  Location: [facility] ORS;  Service: Cardiopulmonary;  Laterality: N/A;  . ESOPHAGEAL DILATION    . ESOPHAGOGASTRODUODENOSCOPY  2008  . ESOPHAGOGASTRODUODENOSCOPY  08/20/2012  . HERNIA REPAIR  07/20/2012   L inguinal hernia repair  . PENILE PROSTHESIS IMPLANT      SOCIAL HISTORY:   Social History   Tobacco Use  . Smoking status: Current Every Day Smoker    Packs/day: 0.50    Years: 42.00    Pack years: 21.00    Types: Cigarettes  .  Smokeless tobacco: Never Used  . Tobacco comment: 02/15/18 For tobacco cessation he is getting better.  He is staying between 6-18 cigarettes a day versus a full pack.  He is also set up to start a one on one smoking cessation class since the current classes were already in progress and did not restart un  Substance Use Topics  . Alcohol use: No    Alcohol/week: 0.0 oz    FAMILY HISTORY:   Family History  Problem Relation Age of Onset  . Heart attack Brother        Brother #1  . Diabetes Brother   . Hypertension Brother        #3  . Coronary artery disease Father 34       deceased  . Heart attack Father   . Diabetes Father   . Heart disease Father   . COPD Mother 2       deceased  . Alcohol abuse Sister        polysubstance abuse  . COPD Sister   . Lung cancer Sister   . Alcohol abuse Sister        polysubstance abuse  . Penile cancer Brother   . Diabetes Brother     DRUG ALLERGIES:   Allergies  Allergen Reactions  . Prozac [Fluoxetine Hcl] Shortness Of Breath  . Effexor Xr [Venlafaxine Hcl Er]   . Wellbutrin [Bupropion] Other (See Comments)    Unknown/"made me feel real funny"    REVIEW OF SYSTEMS:   Review of Systems  Constitutional: Positive for malaise/fatigue. Negative for chills, fever and weight loss.  HENT: Negative for hearing loss and nosebleeds.   Eyes: Negative for blurred vision, double vision and pain.  Respiratory: Positive for shortness of breath. Negative for cough, hemoptysis, sputum production and wheezing.   Cardiovascular: Positive for chest pain. Negative for palpitations, orthopnea and leg swelling.  Gastrointestinal: Negative for abdominal pain, constipation, diarrhea, nausea and vomiting.  Genitourinary: Negative for dysuria and hematuria.  Musculoskeletal: Negative for back pain, falls and myalgias.  Skin: Negative for rash.  Neurological: Negative for dizziness, tremors, sensory change, speech change, focal weakness, seizures and  headaches.  Endo/Heme/Allergies: Does not bruise/bleed easily.  Psychiatric/Behavioral: Negative for depression and memory loss. The patient is not nervous/anxious.     MEDICATIONS AT HOME:   Prior to Admission medications   Medication Sig Start Date End Date Taking? Authorizing Provider  albuterol (VENTOLIN HFA) 108 (90 Base) MCG/ACT inhaler USE 2 INHALATIONS EVERY 6 HOURS AS NEEDED FOR WHEEZING 08/19/17  Yes [name], MD  aspirin 81 MG tablet Take 81 mg by mouth daily.     Yes [provider]  atorvastatin (LIPITOR) 40 MG tablet Take 1 tablet (40 mg total) by mouth daily. 03/03/18  Yes [name], MD  Fluticasone-Salmeterol (ADVAIR DISKUS) 500-50 MCG/DOSE AEPB Inhale 1 puff into the lungs 2 (two) times daily. 07/27/17  Yes [name], MD  furosemide (LASIX) 20 MG tablet Take 1 tablet (20 mg total) by mouth daily. 12/30/17 07/03/19 Yes Max Sane, MD  ipratropium-albuterol (DUONEB) 0.5-2.5 (3) MG/3ML SOLN Take 3 mLs by nebulization every 4 (four) hours as needed. 06/24/18  Yes [name], MD  LORazepam (ATIVAN) 1 MG tablet TAKE 1/2 TABLET BY MOUTH EVERY 6 HOURS AS NEEDED FOR ANXIETY 06/14/18  Yes [name], MD  losartan (COZAAR) 25 MG tablet TAKE 1 TABLET DAILY 01/27/18  Yes Gollan, Kathlene November, MD  mirtazapine (REMERON) 30 MG tablet Take 1 tablet (30 mg total) by mouth at bedtime. 06/24/18  Yes [name], MD  Multiple Vitamin (MULTIVITAMIN) capsule Take 1 capsule by mouth daily.   Yes [provider]  nitroGLYCERIN (NITROSTAT) 0.4 MG SL tablet Place 1 tablet (0.4 mg total) under the tongue every 5 (five) minutes as needed. 10/17/17  Yes Wellington Hampshire, MD  sucralfate (CARAFATE) 1 GM/10ML suspension Take 10 mLs (1 g total) by mouth 4 (four) times daily -  with meals and at bedtime. Patient taking differently: Take 1 g by mouth 4 (four) times daily as needed.  05/16/18  Yes [name], MD  tiotropium (SPIRIVA HANDIHALER) 18 MCG inhalation capsule  Place 1 capsule (18 mcg total) into inhaler and inhale daily. 07/27/17  Yes [name], MD  predniSONE (DELTASONE) 20 MG tablet Take 1 tablet (20 mg total) by mouth daily with breakfast. Patient not taking: Reported on 07/02/2018 05/16/18   [name], MD     VITAL SIGNS:  Blood pressure 117/78, pulse (!) 8, temperature 97.9 F (36.6 C), temperature source Oral, resp. rate 18, height 5\' 10"  (1.778 m), weight 63 kg (139 lb), SpO2 94 %.  PHYSICAL EXAMINATION:  Physical Exam  GENERAL:  67 y.o.-year-old patient lying in the bed with no acute distress.  EYES: Pupils equal, round, reactive to light and accommodation. No scleral icterus. Extraocular muscles intact.  HEENT: Head atraumatic, normocephalic. Oropharynx and nasopharynx clear. No oropharyngeal erythema, moist oral mucosa  NECK:  Supple, no jugular venous distention. No thyroid enlargement, no tenderness.  LUNGS: No, rales, rhonchi. No use of accessory muscles of respiration.  Bilateral wheezing CARDIOVASCULAR: S1, S2 normal. No murmurs, rubs, or gallops.  ABDOMEN: Soft, nontender, nondistended. Bowel sounds present. No organomegaly or mass.  EXTREMITIES: No pedal edema, cyanosis, or clubbing. + 2 pedal & radial pulses b/l.   NEUROLOGIC: Cranial nerves II through XII are intact. No focal Motor or sensory deficits appreciated b/l PSYCHIATRIC: The patient is alert and oriented x 3. Good affect.  SKIN: No obvious rash, lesion, or ulcer.   LABORATORY PANEL:   CBC Recent Labs  Lab 07/02/18 1740  WBC 8.9  HGB 13.9  HCT 40.7  PLT 350   ------------------------------------------------------------------------------------------------------------------  Chemistries  Recent Labs  Lab 07/02/18 1740  NA 140  K 3.9  CL 99  CO2 33*  GLUCOSE 103*  BUN 12  CREATININE 0.84  CALCIUM 9.2   ------------------------------------------------------------------------------------------------------------------  Cardiac  Enzymes Recent Labs  Lab 07/02/18 1740  TROPONINI <0.03   ------------------------------------------------------------------------------------------------------------------  RADIOLOGY:  Dg Chest Port 1 View  Result Date: 07/02/2018 CLINICAL DATA:  Left anterior chest discomfort this afternoon. History of 2 cardiac stents 10 years ago. Positive smoking history. EXAM: PORTABLE CHEST 1 VIEW COMPARISON:  Chest CT  06/01/2018 FINDINGS: Emphysematous hyperinflation of the lungs. Redemonstration of left upper lobe nodule measuring approximately 12 mm radiographically, unchanged from prior CT. Heart and mediastinal contours are within normal limits. There is aortic atherosclerosis without aneurysmal dilatation. No active pulmonary consolidation, effusion or pneumothorax. Osteoarthritis of both shoulders involving the AC and glenohumeral joints. Mild degenerative change along the thoracic spine. IMPRESSION: Emphysematous hyperinflation of the lungs consistent with COPD. Radiographically apparent nodule in the left upper lobe measuring 12 mm. Follow-up recommendations as per recent CT where scattered bilateral pulmonary nodules were noted. Electronically Signed   By: Ashley Royalty M.D.   On: 07/02/2018 18:10     IMPRESSION AND PLAN:   * Left chest burning in patient with h/o CAD Will admit to telemetry and repeat troponin. Nitro PRN Patient tells me he cannot have a chemical stress test due to shortness of breath, wheezing and COPD.  With a cardiac catheterization 1-1/2-year back which showed no significant CAD this pain could be from his wheezing.  He also has had this pain since lung biopsy and could be from his lung nodules. Will request cardiology evaluation for further input. On ASA, Atorvastatin Likely discharge home tomorrow if troponins normal and no findings on telemetry monitoring  *Acute COPD exacerbation.  Will start steroids.  Nebulizers.  Home inhalers.  * HTN Continue medications  *  Chronic systolic chf Stable Continue lasix  * Tobacco abuse Counseled patient to quit smoking.  He tells me he is to smoke 3 packs a day.  No smoke 15 to 16 cigarettes.  Trying to quit.  Encouraged him to stop completely. Time spent greater than 3 minutes  All the records are reviewed and case discussed with ED provider. Management plans discussed with the patient, family and they are in agreement.  CODE STATUS: FULL CODE  TOTAL TIME TAKING CARE OF THIS PATIENT: 35 minutes.   Neita Carp M.D on 07/02/2018 at 7:05 PM  Between 7am to 6pm - Pager - [phone]  After 6pm go to [email or web address] - password [email or web address] [facility]  Office  [phone]  CC: Primary care physician; [name], MD  Note: This dictation was prepared with Dragon dictation along with smaller phrase technology. Any transcriptional errors that result from this process are unintentional.

## 2018-07-02 NOTE — ED Triage Notes (Signed)
Pt arrived from home c/o chest pain burning in nature.  approx  3 hours dull as well . Slightly dizzy  Pt took 324 mg Asa Pta . Ems vitals PTA  PULSE OX 93  %, BP 130/74 Hr 80

## 2018-07-02 NOTE — ED Notes (Signed)
Patient's daughter at bedside.

## 2018-07-02 NOTE — Progress Notes (Signed)
Advance care planning  Purpose of Encounter Chest pain admission and CODE STATUS discussion  Parties in Attendance Patient  Patients Decisional capacity Patient is alert and oriented.  Able to make medical decisions per  HCPOAs - Leslie Sims,Leslie Sims and Leslie Sims,Leslie Sims  Discussed with patient regarding his chest pain, prognosis.  We discussed regarding CODE STATUS.  Patient does not have documented healthcare power of attorney's.  He wants both his daughters to make healthcare decisions if he is unable to.  Encouraged him to finish healthcare power of attorney documentation. He does not want prolonged mechanical ventilation or artificial life support.  Does want intubation and CPR if needed temporarily.  Full code orders entered.  Time spent - 18 minutes

## 2018-07-03 ENCOUNTER — Encounter: Payer: Self-pay | Admitting: Physician Assistant

## 2018-07-03 ENCOUNTER — Observation Stay (HOSPITAL_BASED_OUTPATIENT_CLINIC_OR_DEPARTMENT_OTHER)
Admit: 2018-07-03 | Discharge: 2018-07-03 | Disposition: A | Discharge status: Home / Self Care | Payer: BLUE CROSS/BLUE SHIELD | Attending: Physician Assistant | Admitting: Physician Assistant

## 2018-07-03 DIAGNOSIS — R072 Precordial pain: Secondary | ICD-10-CM

## 2018-07-03 DIAGNOSIS — R0600 Dyspnea, unspecified: Secondary | ICD-10-CM

## 2018-07-03 LAB — TROPONIN I

## 2018-07-03 LAB — ECHOCARDIOGRAM COMPLETE
Height: 70 in
WEIGHTICAEL: 2201.6 [oz_av]

## 2018-07-03 MED ORDER — ASPIRIN EC 81 MG PO TBEC
81.0000 mg | DELAYED_RELEASE_TABLET | Freq: Every day | ORAL | Status: DC
  Administered 2018-07-03: 81 mg via ORAL
  Filled 2018-07-03: qty 1

## 2018-07-03 MED ORDER — IPRATROPIUM-ALBUTEROL 0.5-2.5 (3) MG/3ML IN SOLN
3.0000 mL | Freq: Three times a day (TID) | RESPIRATORY_TRACT | Status: DC
  Administered 2018-07-03: 3 mL via RESPIRATORY_TRACT
  Filled 2018-07-03: qty 3

## 2018-07-03 NOTE — Discharge Summary (Addendum)
[facility] [place] NAME: Leslie Sims    MR#:  [id number]  DATE OF BIRTH:  1951/06/04  DATE OF ADMISSION:  07/02/2018   ADMITTING PHYSICIAN: Hillary Bow, MD  DATE OF DISCHARGE:  07/03/2018 PRIMARY CARE PHYSICIAN: [name], MD   ADMISSION DIAGNOSIS:  Moderate risk chest pain [R07.9] DISCHARGE DIAGNOSIS:  Active Problems:   Chest pain  SECONDARY DIAGNOSIS:   Past Medical History:  Diagnosis Date  . Allergic rhinitis   . Anxiety   . Back pain   . Colon polyps 08/2012   colonoscopy; multiple colon polyps; repeat colonoscopy in one year.  [name] COPD (chronic obstructive pulmonary disease) ([place])   . Coronary artery disease 11/2009   a. late presenting ant MI; b. LHC 100% pLAD s/p PCI/DES, 99% mRCA s/p PCI/DES, EF 35%; c. nuclear stress test 05/13: prior ant/inf infarcts w/o ischemia, EF 43%; d. LHC 02/14: widely patent stents with no other obs dz, EF 40%; e. LHC 11/17: LM nl, mLAD 10%, patent LAD stent, dLAD 20%, p-mRCA 10%, mRCA 40%, patent RCA stent, EF 35-45%   . Depression   . GERD (gastroesophageal reflux disease)   . Headache(784.0)   . Helicobacter pylori (H. pylori)   . Hemorrhoids   . Hernia    inguinal  . Hyperlipidemia   . Hypertension   . Insomnia   . Ischemic cardiomyopathy   . Myalgia   . Peptic ulcer   . ST elevation (STEMI) myocardial infarction involving left anterior descending coronary artery ([place]) 11/2009   a. s/p PCI to the LAD  . Status post dilation of esophageal narrowing 2000  . Tinea pedis   . Wears dentures    partial upper   HOSPITAL COURSE:   * Atypical chest pain/CAD Continue aspirin, Lipitor, nitro PRN.  Echo show ejection fraction 40 to 45%.  *Acute COPD exacerbation.    He is treated with prednisone and Nebulizers.  Home inhalers.  No wheezing or rhonchi.  He does not need steroids.  * HTN Continue medications  * Chronic systolic CHF and ICM. ejection fraction 40 to 45%.    Stable Continue lasix and losartan.  * Tobacco abuse Counseled patient to quit smoking by Dr. Darvin Neighbours.   I discussed with Dr. Fletcher Anon. DISCHARGE CONDITIONS:  Stable, discharge to home today. CONSULTS OBTAINED:  Treatment Team:  Minna Merritts, MD DRUG ALLERGIES:   Allergies  Allergen Reactions  . Prozac [Fluoxetine Hcl] Shortness Of Breath  . Effexor Xr [Venlafaxine Hcl Er]   . Wellbutrin [Bupropion] Other (See Comments)    Unknown/"made me feel real funny"   DISCHARGE MEDICATIONS:   Allergies as of 07/03/2018      Reactions   Prozac [fluoxetine Hcl] Shortness Of Breath   Effexor Xr [venlafaxine Hcl Er]    Wellbutrin [bupropion] Other (See Comments)   Unknown/"made me feel real funny"      Medication List    STOP taking these medications   predniSONE 20 MG tablet Commonly known as:  DELTASONE     TAKE these medications   albuterol 108 (90 Base) MCG/ACT inhaler Commonly known as:  VENTOLIN HFA USE 2 INHALATIONS EVERY 6 HOURS AS NEEDED FOR WHEEZING   aspirin 81 MG tablet Take 81 mg by mouth daily.   atorvastatin 40 MG tablet Commonly known as:  LIPITOR Take 1 tablet (40 mg total) by mouth daily.   Fluticasone-Salmeterol 500-50 MCG/DOSE Aepb Commonly known as:  ADVAIR DISKUS Inhale 1 puff  into the lungs 2 (two) times daily.   furosemide 20 MG tablet Commonly known as:  LASIX Take 1 tablet (20 mg total) by mouth daily.   ipratropium-albuterol 0.5-2.5 (3) MG/3ML Soln Commonly known as:  DUONEB Take 3 mLs by nebulization every 4 (four) hours as needed.   LORazepam 1 MG tablet Commonly known as:  ATIVAN TAKE 1/2 TABLET BY MOUTH EVERY 6 HOURS AS NEEDED FOR ANXIETY   losartan 25 MG tablet Commonly known as:  COZAAR TAKE 1 TABLET DAILY   mirtazapine 30 MG tablet Commonly known as:  REMERON Take 1 tablet (30 mg total) by mouth at bedtime.   multivitamin capsule Take 1 capsule by mouth daily.   nitroGLYCERIN 0.4 MG SL tablet Commonly known as:   NITROSTAT Place 1 tablet (0.4 mg total) under the tongue every 5 (five) minutes as needed.   sucralfate 1 GM/10ML suspension Commonly known as:  CARAFATE Take 10 mLs (1 g total) by mouth 4 (four) times daily -  with meals and at bedtime. What changed:    when to take this  reasons to take this   tiotropium 18 MCG inhalation capsule Commonly known as:  SPIRIVA HANDIHALER Place 1 capsule (18 mcg total) into inhaler and inhale daily.        DISCHARGE INSTRUCTIONS:  See AVS.  If you experience worsening of your admission symptoms, develop shortness of breath, life threatening emergency, suicidal or homicidal thoughts you must seek medical attention immediately by calling 911 or calling your MD immediately  if symptoms less severe.  You Must read complete instructions/literature along with all the possible adverse reactions/side effects for all the Medicines you take and that have been prescribed to you. Take any new Medicines after you have completely understood and accpet all the possible adverse reactions/side effects.   Please note  You were cared for by a hospitalist during your hospital stay. If you have any questions about your discharge medications or the care you received while you were in the hospital after you are discharged, you can call the unit and asked to speak with the hospitalist on call if the hospitalist that took care of you is not available. Once you are discharged, your primary care physician will handle any further medical issues. Please note that NO REFILLS for any discharge medications will be authorized once you are discharged, as it is imperative that you return to your primary care physician (or establish a relationship with a primary care physician if you do not have one) for your aftercare needs so that they can reassess your need for medications and monitor your lab values.    On the day of Discharge:  VITAL SIGNS:  Blood pressure (!) 92/53, pulse 92,  temperature 97.9 F (36.6 C), temperature source Oral, resp. rate 16, height 5\' 10"  (1.778 m), weight 137 lb 9.6 oz (62.4 kg), SpO2 97 %. PHYSICAL EXAMINATION:  GENERAL:  67 y.o.-year-old patient lying in the bed with no acute distress.  EYES: Pupils equal, round, reactive to light and accommodation. No scleral icterus. Extraocular muscles intact.  HEENT: Head atraumatic, normocephalic. Oropharynx and nasopharynx clear.  NECK:  Supple, no jugular venous distention. No thyroid enlargement, no tenderness.  LUNGS: Normal breath sounds bilaterally, no wheezing, rales,rhonchi or crepitation. No use of accessory muscles of respiration.  CARDIOVASCULAR: S1, S2 normal. No murmurs, rubs, or gallops.  ABDOMEN: Soft, non-tender, non-distended. Bowel sounds present. No organomegaly or mass.  EXTREMITIES: No pedal edema, cyanosis, or clubbing.  NEUROLOGIC: Cranial  nerves II through XII are intact. Muscle strength 5/5 in all extremities. Sensation intact. Gait not checked.  PSYCHIATRIC: The patient is alert and oriented x 3.  SKIN: No obvious rash, lesion, or ulcer.  DATA REVIEW:   CBC Recent Labs  Lab 07/02/18 1740  WBC 8.9  HGB 13.9  HCT 40.7  PLT 350    Chemistries  Recent Labs  Lab 07/02/18 1740  NA 140  K 3.9  CL 99  CO2 33*  GLUCOSE 103*  BUN 12  CREATININE 0.84  CALCIUM 9.2     Microbiology Results  Results for orders placed or performed during the hospital encounter of 12/25/17  Blood Culture (routine x 2)     Status: None   Collection Time: 12/25/17  8:44 PM  Result Value Ref Range Status   Specimen Description BLOOD RIGHT ANTECUBITAL  Final   Special Requests   Final    BOTTLES DRAWN AEROBIC AND ANAEROBIC Blood Culture adequate volume   Culture   Final    NO GROWTH 5 DAYS Performed at [facility], [address] [place], [place] [address]    Report Status 12/30/2017 FINAL  Final  Blood Culture (routine x 2)     Status: None   Collection Time: 12/25/17   8:52 PM  Result Value Ref Range Status   Specimen Description BLOOD LEFT WRIST  Final   Special Requests   Final    BOTTLES DRAWN AEROBIC AND ANAEROBIC Blood Culture results may not be optimal due to an excessive volume of blood received in culture bottles   Culture   Final    NO GROWTH 5 DAYS Performed at [facility], [address] [place], [place] [address]    Report Status 12/30/2017 FINAL  Final  MRSA PCR Screening     Status: None   Collection Time: 12/26/17  1:26 AM  Result Value Ref Range Status   MRSA by PCR NEGATIVE NEGATIVE Final    Comment:        The GeneXpert MRSA Assay (FDA approved for NASAL specimens only), is one component of a comprehensive MRSA colonization surveillance program. It is not intended to diagnose MRSA infection nor to guide or monitor treatment for MRSA infections. Performed at [facility], [address] [place], [place] [address]     RADIOLOGY:  Dg Chest Port 1 View  Result Date: 07/02/2018 CLINICAL DATA:  Left anterior chest discomfort this afternoon. History of 2 cardiac stents 10 years ago. Positive smoking history. EXAM: PORTABLE CHEST 1 VIEW COMPARISON:  Chest CT 06/01/2018 FINDINGS: Emphysematous hyperinflation of the lungs. Redemonstration of left upper lobe nodule measuring approximately 12 mm radiographically, unchanged from prior CT. Heart and mediastinal contours are within normal limits. There is aortic atherosclerosis without aneurysmal dilatation. No active pulmonary consolidation, effusion or pneumothorax. Osteoarthritis of both shoulders involving the AC and glenohumeral joints. Mild degenerative change along the thoracic spine. IMPRESSION: Emphysematous hyperinflation of the lungs consistent with COPD. Radiographically apparent nodule in the left upper lobe measuring 12 mm. Follow-up recommendations as per recent CT where scattered bilateral pulmonary nodules were noted. Electronically Signed   By: Ashley Royalty  M.D.   On: 07/02/2018 18:10     Management plans discussed with the patient, family and they are in agreement.  CODE STATUS: Full Code   TOTAL TIME TAKING CARE OF THIS PATIENT: 25 minutes.    Demetrios Loll M.D on 07/03/2018 at 4:39 PM  Between 7am to 6pm - Pager -  [phone]  After 6pm go to [email or web address] - Proofreader  [employer] [facility]  Office  [phone]  CC: Primary care physician; [name], MD   Note: This dictation was prepared with Dragon dictation along with smaller phrase technology. Any transcriptional errors that result from this process are unintentional.

## 2018-07-03 NOTE — Progress Notes (Signed)
*  PRELIMINARY RESULTS* Echocardiogram 2D Echocardiogram has been performed.  Leslie Sims Leslie Sims 07/03/2018, 9:40 AM

## 2018-07-03 NOTE — Plan of Care (Signed)
  Problem: Clinical Measurements: Goal: Cardiovascular complication will be avoided Outcome: Adequate for Discharge   Problem: Cardiac: Goal: Ability to achieve and maintain adequate cardiovascular perfusion will improve Outcome: Adequate for Discharge   Problem: Clinical Measurements: Goal: Diagnostic test results will improve Outcome: Adequate for Discharge   Patient is being discharge home today , echocardiogram was done at bedside result pending, discharge instruction provided, IV and tele will be removed prior to dischage

## 2018-07-03 NOTE — Progress Notes (Signed)
Patient is being discharged home, discharge instruction provided Iv removed tele removed, patient waiting for ride

## 2018-07-03 NOTE — Plan of Care (Signed)
  Problem: Activity: Goal: Risk for activity intolerance will decrease Outcome: Progressing   Problem: Nutrition: Goal: Adequate nutrition will be maintained Outcome: Progressing   Problem: Coping: Goal: Level of anxiety will decrease Outcome: Progressing   Problem: Pain Managment: Goal: General experience of comfort will improve Outcome: Progressing   Problem: Safety: Goal: Ability to remain free from injury will improve Outcome: Progressing   Problem: Education: Goal: Knowledge of General Education information will improve Outcome: Completed/Met

## 2018-07-03 NOTE — Consult Note (Addendum)
Cardiology Consultation:   Patient ID: Leslie Sims [facility]; [id number]; June 24, 1951   Admit date: 07/02/2018 Date of Consult: 07/03/2018  Primary Care Provider: [name], MD Primary Cardiologist: Fletcher Anon   Patient Profile:   Leslie Sims is a 67 y.o. 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 Darvin Neighbours, MD.  History of Present Illness:   Leslie Sims was admitted to the hospital in 11/2009 with a late-presenting anterior wall MI. LHC at that time showed the proximal LAD was 100% stenosed along with the mid RCA being 99% stenosed. He underwent successful PCI/DES to both the LAD and RCA. EF was estimated at 35%. Nuclear stress in 04/2012 showed prior anterior and inferior wall infarcts without ischemia, EF 43%. Repeat LHC in 01/2013 showed widely patent LAD and RCA stents without obstructive disease. Patient most recently underwent LHC in 10/2016 that showed patient LAD and RCA stents with 10% ISR of both the LAD and RCA stents. Patient was also noted to have 20% distal LAD stenosis as well as 40% mid RCA stenosis distal to the previously placed stent. EF was estimated at 35-45%, LVEDP was moderately elevated. Medical therapy was recommended and was placed on Imdur, which did not help with his symptoms. Patient's wife died in 2017/04/12, shortly followed by his sister 3 months later. Following this, he has being dealing with significant anxiety and depression. He was last seen in the office on 04/18/2018 noting continued significant exertional SOB without chest pain. He continued to smoke. Most recent PFTs from 10/2017 showed severe obstructive airway disease with air  trapping and hyperinflation. CT chest in 09/2017 demonstrated bilateral pulmonary nodules with biopsies being negative for malignancy. With follow up CT chest showing interval improvement in lesions. Of note, patient has been eating a fair amount of fast food over the recent months. Patient was admitted to the hospital in 12/2017 for severe AECOPD requiring BiPAP. Patient has been evaluated by multiple pulmonologists over the years. He was most recently seen by Dr. Ashby Dawes on 06/01/2018 as an acute visit for a 1 month history of worsening SOB. CTA chest was performed, which was negative for PE along with waxing and waning bilateral pulmonary nodules with a new 8 mm nodular opacification over the lingula.    Patient was seen at outside office on 06/20/2018 with worsening SOB, chest discomfort and worsening anxiety/depression with ongoing grieving from the loss of his wife and sister. He was noted to not be tolerating Wellbutrin as he felt like his symptoms were worse on that medication. He self-discontinued it on 6/28. There was concern the Wellbutrin may have been playing a role in his symptoms/tachycardia. He has since been placed on Zoloft.   Patient has noted intermittent left-sided chest discomfort and SOB that date back to at least when he had his lung biopsy. This was been essentially unchanged for many months. He has has recent exacerbations of his SOB that have been mostly felt to be in the setting of severe anxiety with ongoing grieving with the loss of his wife and sister. Following church on 7/14, the patient was sitting his in recliner when he developed a soreness along the  left chest wall along the axillary line. Not described as a pain, just soreness. It was similar to prior episodes he has dealt with since his lung biopsy. Soreness lasted ~ 5 minutes and self resolved. He has been symptom free since. There was no associated worsening SOB, diaphoresis, nausea, vomiting, dizziness, presyncope, or  syncope. Symptom did not feel similar to his prior MI. He called EMS and was brought to the hospital.   Upon the patient's arrival to [facility] they were found to have BP 134/90, HR 86 bpm, temp 97.9, oxygen saturation 94% on room air with desaturations into the upper 80s to low 90s requiring supplemental oxygen via nasal cannula overnight, weight 139 pounds. EKG not acute as below, CXR showed hyperinflation consistent with COPD and a left upper lobe nodule measuring 12 mm. Labs showed troponin negative x 3, CBC unremarkable, Na 140, K+ 3.9, BUN/SCr 12/0.84, glucose 103. In the ED/at admission the patient was given Duoneb as well as nitropaste. He was continued on his home medications including Ativan and cardiology was asked to evaluate.   At time of cardiology consult, his BP is noted to be soft in this morning in the 90s/50s. He has remained symptom-free since prior to his arrival. He reports he thinks his anxiety worsened and he had a panic attack. He is currently at his baseline, up sitting in bed watching TV and eating breakfast.   Past Medical History:  Diagnosis Date  . Allergic rhinitis   . Anxiety   . Back pain   . Colon polyps 08/2012   colonoscopy; multiple colon polyps; repeat colonoscopy in one year.  [name] COPD (chronic obstructive pulmonary disease) ([place])   . Coronary artery disease 11/2009   a. late presenting ant MI; b. LHC 100% pLAD s/p PCI/DES, 99% mRCA s/p PCI/DES, EF 35%; c. nuclear stress test 05/13: prior ant/inf infarcts w/o ischemia, EF 43%; d. LHC 02/14: widely patent stents with no other obs dz, EF 40%; e. LHC 11/17: LM nl, mLAD 10%, patent LAD stent, dLAD 20%, p-mRCA 10%, mRCA 40%, patent RCA stent, EF 35-45%   . Depression   . GERD (gastroesophageal reflux disease)   . Headache(784.0)   . Helicobacter pylori (H. pylori)   . Hemorrhoids   . Hernia    inguinal  . Hyperlipidemia   . Hypertension   . Insomnia   . Ischemic cardiomyopathy   . Myalgia   . Peptic ulcer   . ST  elevation (STEMI) myocardial infarction involving left anterior descending coronary artery ([place]) 11/2009   a. s/p PCI to the LAD  . Status post dilation of esophageal narrowing 2000  . Tinea pedis   . Wears dentures    partial upper    Past Surgical History:  Procedure Laterality Date  . Admission  12/20/2012   COPD exacerbation.  [place].  [name] CARDIAC CATHETERIZATION    . CARDIAC CATHETERIZATION  02/05/13   [facility]  . CARDIAC CATHETERIZATION  10/14   [facility] : patent stents with no change in anatomy. EF: 40$  . CARDIAC CATHETERIZATION Left 11/12/2016   Procedure: Left Heart Cath and Coronary Angiography;  Surgeon: Wellington Hampshire, MD;  Location: [place] [facility];  Service: Cardiovascular;  Laterality: Left;  . COLONOSCOPY    . COLONOSCOPY WITH PROPOFOL N/A 05/18/2016   Procedure: COLONOSCOPY WITH PROPOFOL;  Surgeon: Lucilla Lame, MD;  Location: [facility] ENDOSCOPY;  Service: Endoscopy;  Laterality: N/A;  . CORONARY ANGIOPLASTY WITH STENT PLACEMENT  09/2009   LAD 3.0 X23  mm Xience DES, RCA: 4.0 X 15 mm Xience DES  . ELECTROMAGNETIC NAVIGATION BROCHOSCOPY N/A 10/27/2017   Procedure: ELECTROMAGNETIC NAVIGATION BRONCHOSCOPY;  Surgeon: Flora Lipps, MD;  Location: [facility] ORS;  Service: Cardiopulmonary;  Laterality: N/A;  . ESOPHAGEAL DILATION    . ESOPHAGOGASTRODUODENOSCOPY  2008  . ESOPHAGOGASTRODUODENOSCOPY  08/20/2012  . HERNIA REPAIR  07/20/2012   L inguinal hernia repair  . PENILE PROSTHESIS IMPLANT       Home Meds: Prior to Admission medications   Medication Sig Start Date End Date Taking? Authorizing Provider  albuterol (VENTOLIN HFA) 108 (90 Base) MCG/ACT inhaler USE 2 INHALATIONS EVERY 6 HOURS AS NEEDED FOR WHEEZING 08/19/17  Yes [name], MD  aspirin 81 MG tablet Take 81 mg by mouth daily.     Yes [provider]  atorvastatin (LIPITOR) 40 MG tablet Take 1 tablet (40 mg total) by mouth daily. 03/03/18  Yes [name], MD  Fluticasone-Salmeterol (ADVAIR DISKUS) 500-50  MCG/DOSE AEPB Inhale 1 puff into the lungs 2 (two) times daily. 07/27/17  Yes [name], MD  furosemide (LASIX) 20 MG tablet Take 1 tablet (20 mg total) by mouth daily. 12/30/17 07/03/19 Yes Max Sane, MD  ipratropium-albuterol (DUONEB) 0.5-2.5 (3) MG/3ML SOLN Take 3 mLs by nebulization every 4 (four) hours as needed. 06/24/18  Yes [name], MD  LORazepam (ATIVAN) 1 MG tablet TAKE 1/2 TABLET BY MOUTH EVERY 6 HOURS AS NEEDED FOR ANXIETY 06/14/18  Yes [name], MD  losartan (COZAAR) 25 MG tablet TAKE 1 TABLET DAILY 01/27/18  Yes Gollan, Kathlene November, MD  mirtazapine (REMERON) 30 MG tablet Take 1 tablet (30 mg total) by mouth at bedtime. 06/24/18  Yes [name], MD  Multiple Vitamin (MULTIVITAMIN) capsule Take 1 capsule by mouth daily.   Yes [provider]  nitroGLYCERIN (NITROSTAT) 0.4 MG SL tablet Place 1 tablet (0.4 mg total) under the tongue every 5 (five) minutes as needed. 10/17/17  Yes Wellington Hampshire, MD  sucralfate (CARAFATE) 1 GM/10ML suspension Take 10 mLs (1 g total) by mouth 4 (four) times daily -  with meals and at bedtime. Patient taking differently: Take 1 g by mouth 4 (four) times daily as needed.  05/16/18  Yes [name], MD  tiotropium (SPIRIVA HANDIHALER) 18 MCG inhalation capsule Place 1 capsule (18 mcg total) into inhaler and inhale daily. 07/27/17  Yes [name], MD  predniSONE (DELTASONE) 20 MG tablet Take 1 tablet (20 mg total) by mouth daily with breakfast. Patient not taking: Reported on 07/02/2018 05/16/18   [name], MD    Inpatient Medications: Scheduled Meds: . aspirin EC  81 mg Oral Daily  . atorvastatin  40 mg Oral QPM  . enoxaparin (LOVENOX) injection  40 mg Subcutaneous Q24H  . furosemide  20 mg Oral Daily  . ipratropium-albuterol  3 mL Nebulization Once  . ipratropium-albuterol  3 mL Nebulization TID  . mirtazapine  30 mg Oral QHS  . mometasone-formoterol  2 puff Inhalation BID  . predniSONE  50 mg Oral Q breakfast  .  sodium chloride flush  3 mL Intravenous Q12H  . tiotropium  18 mcg Inhalation Daily   Continuous Infusions:  PRN Meds: acetaminophen **OR** acetaminophen, albuterol, ipratropium-albuterol, LORazepam, nitroGLYCERIN, ondansetron **OR** ondansetron (ZOFRAN) IV, polyethylene glycol  Allergies:   Allergies  Allergen Reactions  . Prozac [Fluoxetine Hcl] Shortness Of Breath  . Effexor Xr [Venlafaxine Hcl Er]   . Wellbutrin [Bupropion] Other (See Comments)  Unknown/"made me feel real funny"    Social History:   Social History   Socioeconomic History  . Marital status: Widowed    Spouse name: Not on file  . Number of children: 5  . Years of education: Not on file  . Highest education level: Not on file  Occupational History  . 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 Financial resource strain: Not on file  . Food insecurity:    Worry: Not on file    Inability: Not on file  . Transportation needs:    Medical: Not on file    Non-medical: Not on file  Tobacco Use  . Smoking status: Current Every Day Smoker    Packs/day: 0.50    Years: 42.00    Pack years: 21.00    Types: Cigarettes  . Smokeless tobacco: Never Used  . Tobacco comment: 02/15/18 For tobacco cessation he is getting better.  He is staying between 6-18 cigarettes a day versus a full pack.  He is also set up to start a one on one smoking cessation class since the current classes were already in progress and did not restart un  Substance and Sexual Activity  . Alcohol use: No    Alcohol/week: 0.0 oz  . Drug use: No  . Sexual activity: Yes  Lifestyle  . Physical activity:    Days per week: Not on file    Minutes per session: Not on file  . Stress: Not on file  Relationships  . Social connections:    Talks on phone: Not on file    Gets together: Not on file    Attends religious service: Not on file    Active member of club or organization: Not on file    Attends  meetings of clubs or organizations: Not on file    Relationship status: Not on file  . Intimate partner violence:    Fear of current or ex partner: Not on file    Emotionally abused: Not on file    Physically abused: Not on file    Forced sexual activity: Not on file  Other Topics Concern  . Not on file  Social History Narrative   Marital status: married x 32 years; happily married.      Children: 4 children;  1 stepchild and 15 grandchildren; no great grandchildren.      Lives: with wife; 2 dogs, 1 cat.      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      Tobacco: 1 ppd x 45 years      Alcohol: none; previous alcoholism      Drugs: none      Exercise: no formal exercise; physically demanding job; owns horses.      Seatbelts:  100%      Guns: one loaded unsecured gun in the home.      Sunscreen: none     Family History:   Family History  Problem Relation Age of Onset  . Heart attack Brother        Brother #1  . Diabetes Brother   . Hypertension Brother        #3  . Coronary artery disease Father 28       deceased  . Heart attack Father   . Diabetes Father   . Heart disease Father   . COPD Mother 9       deceased  . Alcohol abuse Sister  polysubstance abuse  . COPD Sister   . Lung cancer Sister   . Alcohol abuse Sister        polysubstance abuse  . Penile cancer Brother   . Diabetes Brother     ROS:  Review of Systems  Constitutional: Positive for malaise/fatigue. Negative for chills, diaphoresis, fever and weight loss.  HENT: Negative for congestion.   Eyes: Negative for discharge and redness.  Respiratory: Positive for shortness of breath. Negative for cough, hemoptysis, sputum production and wheezing.   Cardiovascular: Positive for chest pain. Negative for palpitations, orthopnea, claudication, leg swelling and PND.  Gastrointestinal: Negative for abdominal pain, blood in stool, heartburn, melena, nausea and vomiting.  Genitourinary:  Negative for hematuria.  Musculoskeletal: Negative for falls and myalgias.  Skin: Negative for rash.  Neurological: Positive for weakness. Negative for dizziness, tingling, tremors, sensory change, speech change, focal weakness and loss of consciousness.  Endo/Heme/Allergies: Does not bruise/bleed easily.  Psychiatric/Behavioral: Negative for substance abuse. The patient is nervous/anxious.   All other systems reviewed and are negative.     Physical Exam/Data:   Vitals:   07/02/18 2042 07/02/18 2141 07/03/18 0343 07/03/18 0644  BP: 126/85  (!) 92/53   Pulse: 86 85 88 92  Resp: 20 16 18 16   Temp: 97.8 F (36.6 C)  97.9 F (36.6 C)   TempSrc: Oral  Oral   SpO2: 97% 94% 96% 97%  Weight:      Height:        Intake/Output Summary (Last 24 hours) at 07/03/2018 0741 Last data filed at 07/02/2018 2133 Gross per 24 hour  Intake 10 ml  Output 325 ml  Net -315 ml   Filed Weights   07/02/18 1750 07/02/18 2035  Weight: 139 lb (63 kg) 137 lb 9.6 oz (62.4 kg)   Body mass index is 19.74 kg/m.   Physical Exam: General: Well developed, well nourished, in no acute distress. Head: Normocephalic, atraumatic, sclera non-icteric, no xanthomas, nares without discharge.  Neck: Negative for carotid bruits. JVD not elevated. Lungs: Clear bilaterally to auscultation without wheezes, rales, or rhonchi. Diminished breath sounds bilaterally. Breathing is unlabored. Heart: RRR with S1 S2. No murmurs, rubs, or gallops appreciated. Abdomen: Soft, non-tender, non-distended with normoactive bowel sounds. No hepatomegaly. No rebound/guarding. No obvious abdominal masses. Msk:  Strength and tone appear normal for age. Extremities: No clubbing or cyanosis. No edema. Distal pedal pulses are 2+ and equal bilaterally. Neuro: Alert and oriented X 3. No facial asymmetry. No focal deficit. Moves all extremities spontaneously. Psych:  Responds to questions appropriately with a normal affect.   EKG:  The EKG was  personally reviewed and demonstrates: NSR, 90 bpm, left axis deviation, left anterior fascicular block, poor R wave progression, prior anteroseptal infarct Telemetry:  Telemetry was personally reviewed and demonstrates: NSR, rare PVC  Weights: Filed Weights   07/02/18 1750 07/02/18 2035  Weight: 139 lb (63 kg) 137 lb 9.6 oz (62.4 kg)    Relevant CV Studies:  LHC 11/12/2016:  Diagnostic  Dominance: Right  Left Main  Vessel is angiographically normal.  Left Anterior Descending  Mid LAD lesion 10% stenosed  The lesion was previously treated.  Dist LAD lesion 20% stenosed  Dist LAD lesion.  Ramus Intermedius  The vessel exhibits minimal luminal irregularities.  Left Circumflex  There is mild the vessel.  First Obtuse Marginal Branch  Vessel is small in size. Vessel is angiographically normal.  Second Obtuse Marginal Branch  Vessel is small in size. Vessel is  angiographically normal.  Third Obtuse Marginal Branch  Vessel is angiographically normal.  Right Coronary Artery  Prox RCA to Mid RCA lesion 10% stenosed  The lesion was previously treated.  Mid RCA lesion 40% stenosed  Mid RCA lesion.  Right Posterior Descending Artery  Vessel is angiographically normal.  Right Posterior Atrioventricular Branch  Vessel is small in size. Vessel is angiographically normal.  First Right Posterolateral  Vessel is angiographically normal.  Second Right Posterolateral  Vessel is angiographically normal.  Intervention   No interventions have been documented.  Wall Motion              Left Heart   Left Ventricle The left ventricle is mildly dilated. There is mild to moderate left ventricular systolic dysfunction. LV end diastolic pressure is moderately elevated. The left ventricular ejection fraction is 35-45% by visual estimate. There are LV function abnormalities due to segmental dysfunction.  Coronary Diagrams   Diagnostic Diagram        Conclusion     There is mild  to moderate left ventricular systolic dysfunction.  The left ventricular ejection fraction is 35-45% by visual estimate.  LV end diastolic pressure is moderately elevated.  Prox RCA to Mid RCA lesion, 10 %stenosed.  Mid RCA lesion, 40 %stenosed.  Mid LAD lesion, 10 %stenosed.  Dist LAD lesion, 20 %stenosed.   1. Patent LAD and RCA stents with no evidence of obstructive coronary artery disease. 2. Mildly to moderately reduced LV systolic function with severe anterior and apical hypokinesis. Ejection fraction is 35-40%. 3. Moderately elevated left ventricular end-diastolic pressure.  Recommendations: Continue aggressive medical therapy. Consider treatment with a beta blocker if the patient can tolerate and consider a small dose diuretic.    Laboratory Data:  Chemistry Recent Labs  Lab 07/02/18 1740  NA 140  K 3.9  CL 99  CO2 33*  GLUCOSE 103*  BUN 12  CREATININE 0.84  CALCIUM 9.2  GFRNONAA >60  GFRAA >60  ANIONGAP 8    No results for input(s): PROT, ALBUMIN, AST, ALT, ALKPHOS, BILITOT in the last 168 hours. Hematology Recent Labs  Lab 07/02/18 1740  WBC 8.9  RBC 4.56  HGB 13.9  HCT 40.7  MCV 89.3  MCH 30.5  MCHC 34.1  RDW 13.6  PLT 350   Cardiac Enzymes Recent Labs  Lab 07/02/18 1740 07/02/18 2239 07/03/18 0416  TROPONINI <0.03 <0.03 <0.03   No results for input(s): TROPIPOC in the last 168 hours.  BNPNo results for input(s): BNP, PROBNP in the last 168 hours.  DDimer No results for input(s): DDIMER in the last 168 hours.  Radiology/Studies:  Dg Chest Port 1 View  Result Date: 07/02/2018 IMPRESSION: Emphysematous hyperinflation of the lungs consistent with COPD. Radiographically apparent nodule in the left upper lobe measuring 12 mm. Follow-up recommendations as per recent CT where scattered bilateral pulmonary nodules were noted. Electronically Signed   By: Ashley Royalty M.D.   On: 07/02/2018 18:10    Assessment and Plan:   1. Atypical chest  pain/CAD: -Symptoms are not consistent with ACS -Has ruled out -EKG not acute -Recent LHC demonstrating patent LAD and RCA stents with no obstructive disease -Unfortunately, he was not made NPO and has already eaten breakfast this morning which limits our ability for testing today -Check echo, if this is essentially unchanged to improved, assuming he remains symptom-free, we could plan to pursue outpatient ischemic testing (given he has already eaten and this cannot be done today). If this echo demonstrates  worsening EF or new WMA concerning for ischemia he would need to remain admitted for [facility] on 7/16 -ASA -Stop nitropaste given soft BP -Symptoms have not previously responded to Imdur -Consider trial of Ranexa, if symptoms return  -Consider non-cardiac etiologies of his symptoms as well, defer to IM  2. ICM: -He does not appear grossly volume up -Not on a beta blocker secondary to intolerance of dyspnea -Continue losartan as BP allows -If EF remains reduced consider addition of spironolactone  -Continue PTA Lasix -Check echo as above -CHF education -Daily weights, strict Is and Os  3. HLD: -Goal LDL < 70 -LDL of 67 from 07/2017 -Check FLP -Lipitor 40 mg  4. HTN: -BP soft this morning -Discontinue nitropaste -Losartan as above  5. 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 Pulmonary nodules: -Outpatient follow up with pulmonology  7. Anxiety/depression: -Likely playing a significant role in the above -Per IM    For questions or updates, please contact [place] Please consult [email or web address] for contact info under Cardiology/STEMI.   Signed, Christell Faith, PA-C [facility] Pager: [phone] 07/03/2018, 7:41 AM

## 2018-07-04 ENCOUNTER — Ambulatory Visit: Payer: BLUE CROSS/BLUE SHIELD | Admitting: Psychology

## 2018-07-04 DIAGNOSIS — F411 Generalized anxiety disorder: Secondary | ICD-10-CM | POA: Diagnosis not present

## 2018-07-07 ENCOUNTER — Other Ambulatory Visit: Payer: Self-pay

## 2018-07-07 ENCOUNTER — Encounter: Payer: Self-pay | Admitting: Family Medicine

## 2018-07-07 ENCOUNTER — Ambulatory Visit: Payer: BLUE CROSS/BLUE SHIELD | Admitting: Family Medicine

## 2018-07-07 VITALS — BP 112/68 | HR 120 | Temp 98.0°F | Resp 16 | Ht 68.0 in | Wt 137.0 lb

## 2018-07-07 DIAGNOSIS — F341 Dysthymic disorder: Secondary | ICD-10-CM | POA: Diagnosis not present

## 2018-07-07 DIAGNOSIS — R0789 Other chest pain: Secondary | ICD-10-CM | POA: Diagnosis not present

## 2018-07-07 DIAGNOSIS — J449 Chronic obstructive pulmonary disease, unspecified: Secondary | ICD-10-CM

## 2018-07-07 DIAGNOSIS — I5022 Chronic systolic (congestive) heart failure: Secondary | ICD-10-CM

## 2018-07-07 DIAGNOSIS — F4321 Adjustment disorder with depressed mood: Secondary | ICD-10-CM | POA: Diagnosis not present

## 2018-07-07 DIAGNOSIS — I2510 Atherosclerotic heart disease of native coronary artery without angina pectoris: Secondary | ICD-10-CM | POA: Diagnosis not present

## 2018-07-07 MED ORDER — LORAZEPAM 1 MG PO TABS: 1.0000 mg | ORAL_TABLET | Freq: Three times a day (TID) | ORAL | 5 refills | Status: DC | PRN

## 2018-07-07 MED ORDER — FUROSEMIDE 20 MG PO TABS: 20.0000 mg | ORAL_TABLET | Freq: Every day | ORAL | 3 refills | Status: DC

## 2018-07-07 NOTE — Patient Instructions (Signed)
     IF you received an x-ray today, you will receive an invoice from [facility]. Please contact [facility] at [phone] with questions or concerns regarding your invoice.   IF you received labwork today, you will receive an invoice from [facility]. Please contact [facility] at [phone] with questions or concerns regarding your invoice.   Our billing staff will not be able to assist you with questions regarding bills from these companies.  You will be contacted with the lab results as soon as they are available. The fastest way to get your results is to activate your My Chart account. Instructions are located on the last page of this paperwork. If you have not heard from us regarding the results in 2 weeks, please contact this office.     

## 2018-07-07 NOTE — Progress Notes (Signed)
Subjective:    Patient ID: Leslie Sims, male    DOB: 11-15-1951, 67 y.o.   MRN: [id number]  07/07/2018  Hospitalization Follow-up (was seen in the ER on 7/14 for chest pain ); Chest Pain; Depression; and COPD    HPI This 67 y.o. male presents for [place] for chest pain.  Details of discharge summary include the following:   DATE OF ADMISSION:  07/02/2018     ADMITTING PHYSICIAN: Hillary Bow, MD DATE OF DISCHARGE:  07/03/2018 PRIMARY CARE PHYSICIAN: [name], MD   ADMISSION DIAGNOSIS:  Moderate risk chest pain [R07.9] DISCHARGE DIAGNOSIS:  Active Problems:   Chest pain  SECONDARY DIAGNOSIS:       Past Medical History:  Diagnosis Date  . Allergic rhinitis   . Anxiety   . Back pain   . Colon polyps 08/2012   colonoscopy; multiple colon polyps; repeat colonoscopy in one year.  [name] COPD (chronic obstructive pulmonary disease) ([place])   . Coronary artery disease 11/2009   a. late presenting ant MI; b. LHC 100% pLAD s/p PCI/DES, 99% mRCA s/p PCI/DES, EF 35%; c. nuclear stress test 05/13: prior ant/inf infarcts w/o ischemia, EF 43%; d. LHC 02/14: widely patent stents with no other obs dz, EF 40%; e. LHC 11/17: LM nl, mLAD 10%, patent LAD stent, dLAD 20%, p-mRCA 10%, mRCA 40%, patent RCA stent, EF 35-45%   . Depression   . GERD (gastroesophageal reflux disease)   . Headache(784.0)   . Helicobacter pylori (H. pylori)   . Hemorrhoids   . Hernia    inguinal  . Hyperlipidemia   . Hypertension   . Insomnia   . Ischemic cardiomyopathy   . Myalgia   . Peptic ulcer   . ST elevation (STEMI) myocardial infarction involving left anterior descending coronary artery ([place]) 11/2009   a. s/p PCI to the LAD  . Status post dilation of esophageal narrowing 2000  . Tinea pedis   . Wears dentures    partial upper   HOSPITAL COURSE:   *Atypical chest pain/CAD Continue aspirin, Lipitor, nitro PRN.  Echo show ejection fraction 40 to  45%.  *Acute COPD exacerbation.   He is treated with prednisone andNebulizers. Home inhalers.  No wheezing or rhonchi.  He does not need steroids.  * HTN Continue medications  * Chronic systolic CHF and ICM. ejection fraction 40 to 45%.  Stable Continue lasix and losartan.  * Tobacco abuse Counseled patient to quit smoking by Dr. Darvin Neighbours.   I discussed with Dr. Fletcher Anon. DISCHARGE CONDITIONS:  Stable, discharge to home today. CONSULTS OBTAINED:  Treatment Team:  Minna Merritts, MD DRUG ALLERGIES:        Allergies  Allergen Reactions  . Prozac [Fluoxetine Hcl] Shortness Of Breath  . Effexor Xr [Venlafaxine Hcl Er]   . Wellbutrin [Bupropion] Other (See Comments)    Unknown/"made me feel real funny"   DISCHARGE MEDICATIONS:        Allergies as of 07/03/2018      Reactions   Prozac [fluoxetine Hcl] Shortness Of Breath   Effexor Xr [venlafaxine Hcl Er]    Wellbutrin [bupropion] Other (See Comments)   Unknown/"made me feel real funny"         Medication List    STOP taking these medications   predniSONE 20 MG tablet Commonly known as:  DELTASONE     TAKE these medications   albuterol 108 (90 Base) MCG/ACT inhaler Commonly known as:  VENTOLIN HFA USE 2 INHALATIONS EVERY  6 HOURS AS NEEDED FOR WHEEZING   aspirin 81 MG tablet Take 81 mg by mouth daily.   atorvastatin 40 MG tablet Commonly known as:  LIPITOR Take 1 tablet (40 mg total) by mouth daily.   Fluticasone-Salmeterol 500-50 MCG/DOSE Aepb Commonly known as:  ADVAIR DISKUS Inhale 1 puff into the lungs 2 (two) times daily.   furosemide 20 MG tablet Commonly known as:  LASIX Take 1 tablet (20 mg total) by mouth daily.   ipratropium-albuterol 0.5-2.5 (3) MG/3ML Soln Commonly known as:  DUONEB Take 3 mLs by nebulization every 4 (four) hours as needed.   LORazepam 1 MG tablet Commonly known as:  ATIVAN TAKE 1/2 TABLET BY MOUTH EVERY 6 HOURS AS NEEDED FOR ANXIETY    losartan 25 MG tablet Commonly known as:  COZAAR TAKE 1 TABLET DAILY   mirtazapine 30 MG tablet Commonly known as:  REMERON Take 1 tablet (30 mg total) by mouth at bedtime.   multivitamin capsule Take 1 capsule by mouth daily.   nitroGLYCERIN 0.4 MG SL tablet Commonly known as:  NITROSTAT Place 1 tablet (0.4 mg total) under the tongue every 5 (five) minutes as needed.   sucralfate 1 GM/10ML suspension Commonly known as:  CARAFATE Take 10 mLs (1 g total) by mouth 4 (four) times daily -  with meals and at bedtime. What changed:    when to take this  reasons to take this   tiotropium 18 MCG inhalation capsule Commonly known as:  SPIRIVA HANDIHALER Place 1 capsule (18 mcg total) into inhaler and inhale daily.        [place]:  No recurrent chest pain since hospital discharge. Came home from church; felt good; ate a biscuit.  Woke up from nap with L sided chest pain.  Called EMS; ran EKG; BP was good; EKG was good. Transported via EMS.  Ran EKG and Troponin I x 2; negative. S/p echo. Saw Dr. Fletcher Anon; recommended hospital discharge.   Discharged from hospital; has been more depressed; nerves just shot.   Last saw Dr. Nicolasa Ducking one week ago.  Changed to 100mg  Zoloft and Remeron at night.  Stopped Wellbutrin.  Stopped Wellbutrin one week ago.  No change in Remeron dose.  Increased Zoloft to 100mg .   Therapist is going to try to pay for group session instead of one on one.  Willing to do anything that will help.  With therapist, able to laugh a little bit.   Scheduled to see therapist weekly.   Seeing Dr. Nicolasa Ducking again this month.  07/26/2018.   No pain still having burning; couple of dull pains.  Had a dull pain on ambulance.  Thought of hurting self the other night; removed guns away.   Called Kennyth Lose sister to see if can stay with her for a while. Jackie's sister lives in [place].   Not doing anything.; does not feel like doing anything.  Cannot  get out of house to do anything. Does get up to get something to eat. Cannot go to grocery store alone; not sure. Scared something is going to happen.   Needs to follow-up with cardiology in one week. Last cath two years ago.  Plans to give guns to daughter.   Has not seen daughter since hospital discharge.  Two daughters here and one granddaughter.   Roselyn Reef works at [employer] to come get patient; got patient settled in.  Asked Jewl to sit with pt.  Called granddaughter to sit with patient.  Granddaughter did not want to sit with patient; said that she would come by the next day.  Then called Jewel to bring to [place]; Jewel returned call when she got to office.   Feels like working against pt.  Forcing patient to get out of the house.   Buddy has a big horse shoe coming up; also has another one coming up.   Anniversary of wife's death on 08/08/18. Baby brother has not been by to check on patient.  When gets in church feels good. Then leaves.  Ate in [place]; good meal. Then went home.   BP Readings from Last 3 Encounters:  07/07/18 112/68  07/03/18 (!) 92/53  06/01/18 102/62   Wt Readings from Last 3 Encounters:  07/07/18 137 lb (62.1 kg)  07/02/18 137 lb 9.6 oz (62.4 kg)  06/01/18 137 lb (62.1 kg)   Immunization History  Administered Date(s) Administered  . Influenza Whole 09/19/2012  . Influenza,inj,Quad PF,6+ Mos 10/24/2013, 11/25/2014, 03/02/2016, 10/27/2016, 10/05/2017  . Pneumococcal Conjugate-13 11/25/2014  . Pneumococcal Polysaccharide-23 08/24/2016, 10/27/2016  . Pneumococcal-Unspecified 12/20/2008  . Tdap 12/20/2008    Review of Systems  Constitutional: Negative for activity change, appetite change, chills, diaphoresis, fatigue, fever and unexpected weight change.  HENT: Negative for congestion, dental problem, drooling, ear discharge, ear pain, facial swelling, hearing loss, mouth sores, nosebleeds, postnasal drip, rhinorrhea, sinus pressure, sneezing, sore  throat, tinnitus, trouble swallowing and voice change.   Eyes: Negative for photophobia, pain, discharge, redness, itching and visual disturbance.  Respiratory: Negative for apnea, cough, choking, chest tightness, shortness of breath, wheezing and stridor.   Cardiovascular: Negative for chest pain, palpitations and leg swelling.  Gastrointestinal: Negative for abdominal distention, abdominal pain, anal bleeding, blood in stool, constipation, diarrhea, nausea, rectal pain and vomiting.  Endocrine: Negative for cold intolerance, heat intolerance, polydipsia, polyphagia and polyuria.  Genitourinary: Negative for decreased urine volume, difficulty urinating, discharge, dysuria, enuresis, flank pain, frequency, genital sores, hematuria, penile pain, penile swelling, scrotal swelling, testicular pain and urgency.  Musculoskeletal: Negative for arthralgias, back pain, gait problem, joint swelling, myalgias, neck pain and neck stiffness.  Skin: Negative for color change, pallor, rash and wound.  Allergic/Immunologic: Negative for environmental allergies, food allergies and immunocompromised state.  Neurological: Negative for dizziness, tremors, seizures, syncope, facial asymmetry, speech difficulty, weakness, light-headedness, numbness and headaches.  Hematological: Negative for adenopathy. Does not bruise/bleed easily.  Psychiatric/Behavioral: Positive for dysphoric mood. Negative for agitation, behavioral problems, confusion, decreased concentration, hallucinations, self-injury, sleep disturbance and suicidal ideas. The patient is nervous/anxious. The patient is not hyperactive.     Past Medical History:  Diagnosis Date  . Allergic rhinitis   . Anxiety   . Back pain   . Colon polyps 08/2012   colonoscopy; multiple colon polyps; repeat colonoscopy in one year.  [name] COPD (chronic obstructive pulmonary disease) ([place])   . Coronary artery disease 11/2009   a. late presenting ant MI; b. LHC 100% pLAD s/p  PCI/DES, 99% mRCA s/p PCI/DES, EF 35%; c. nuclear stress test 05/13: prior ant/inf infarcts w/o ischemia, EF 43%; d. LHC 02/14: widely patent stents with no other obs dz, EF 40%; e. LHC 11/17: LM nl, mLAD 10%, patent LAD stent, dLAD 20%, p-mRCA 10%, mRCA 40%, patent RCA stent, EF 35-45%   . Depression   . GERD (gastroesophageal reflux disease)   . Headache(784.0)   . Helicobacter pylori (H. pylori)   . Hemorrhoids   . Hernia    inguinal  . Hyperlipidemia   . Hypertension   .  Insomnia   . Ischemic cardiomyopathy   . Myalgia   . Peptic ulcer   . ST elevation (STEMI) myocardial infarction involving left anterior descending coronary artery ([place]) 11/2009   a. s/p PCI to the LAD  . Status post dilation of esophageal narrowing 2000  . Tinea pedis   . Wears dentures    partial upper   Past Surgical History:  Procedure Laterality Date  . Admission  12/20/2012   COPD exacerbation.  [facility].  [name] CARDIAC CATHETERIZATION    . CARDIAC CATHETERIZATION  02/05/13   [facility]  . CARDIAC CATHETERIZATION  10/14   [facility] : patent stents with no change in anatomy. EF: 40$  . CARDIAC CATHETERIZATION Left 11/12/2016   Procedure: Left Heart Cath and Coronary Angiography;  Surgeon: Wellington Hampshire, MD;  Location: [facility];  Service: Cardiovascular;  Laterality: Left;  . COLONOSCOPY    . COLONOSCOPY WITH PROPOFOL N/A 05/18/2016   Procedure: COLONOSCOPY WITH PROPOFOL;  Surgeon: Lucilla Lame, MD;  Location: [facility] ENDOSCOPY;  Service: Endoscopy;  Laterality: N/A;  . CORONARY ANGIOPLASTY WITH STENT PLACEMENT  09/2009   LAD 3.0 X23 mm Xience DES, RCA: 4.0 X 15 mm Xience DES  . ELECTROMAGNETIC NAVIGATION BROCHOSCOPY N/A 10/27/2017   Procedure: ELECTROMAGNETIC NAVIGATION BRONCHOSCOPY;  Surgeon: Flora Lipps, MD;  Location: [facility] ORS;  Service: Cardiopulmonary;  Laterality: N/A;  . ESOPHAGEAL DILATION    . ESOPHAGOGASTRODUODENOSCOPY  2008  . ESOPHAGOGASTRODUODENOSCOPY  08/20/2012  . HERNIA REPAIR  07/20/2012   L  inguinal hernia repair  . PENILE PROSTHESIS IMPLANT     Allergies  Allergen Reactions  . Prozac [Fluoxetine Hcl] Shortness Of Breath  . Effexor Xr [Venlafaxine Hcl Er]   . Wellbutrin [Bupropion] Other (See Comments)    Unknown/"made me feel real funny"   Current Outpatient Medications on File Prior to Visit  Medication Sig Dispense Refill  . albuterol (VENTOLIN HFA) 108 (90 Base) MCG/ACT inhaler USE 2 INHALATIONS EVERY 6 HOURS AS NEEDED FOR WHEEZING 48 g 3  . aspirin 81 MG tablet Take 81 mg by mouth daily.      [name] atorvastatin (LIPITOR) 40 MG tablet Take 1 tablet (40 mg total) by mouth daily. 90 tablet 3  . Fluticasone-Salmeterol (ADVAIR DISKUS) 500-50 MCG/DOSE AEPB Inhale 1 puff into the lungs 2 (two) times daily. 180 each 3  . hydrOXYzine (VISTARIL) 25 MG capsule Take 25 mg by mouth 2 (two) times daily as needed.  0  . ipratropium-albuterol (DUONEB) 0.5-2.5 (3) MG/3ML SOLN Take 3 mLs by nebulization every 4 (four) hours as needed. 1620 mL 3  . losartan (COZAAR) 25 MG tablet TAKE 1 TABLET DAILY 90 tablet 3  . mirtazapine (REMERON) 30 MG tablet Take 1 tablet (30 mg total) by mouth at bedtime. 30 tablet 5  . Multiple Vitamin (MULTIVITAMIN) capsule Take 1 capsule by mouth daily.    . nitroGLYCERIN (NITROSTAT) 0.4 MG SL tablet Place 1 tablet (0.4 mg total) under the tongue every 5 (five) minutes as needed. 25 tablet 3  . sertraline (ZOLOFT) 100 MG tablet TAKE ONE TABLET BY MOUTH EVERY MORNING WITH BREAKFAST  2  . sucralfate (CARAFATE) 1 GM/10ML suspension Take 10 mLs (1 g total) by mouth 4 (four) times daily -  with meals and at bedtime. (Patient taking differently: Take 1 g by mouth 4 (four) times daily as needed. ) 3600 mL 3  . tiotropium (SPIRIVA HANDIHALER) 18 MCG inhalation capsule Place 1 capsule (18 mcg total) into inhaler and inhale daily. [place]  capsule 3   No current facility-administered medications on file prior to visit.    Social History   Socioeconomic History  . Marital status:  Widowed    Spouse name: Not on file  . Number of children: 5  . Years of education: Not on file  . Highest education level: Not on file  Occupational History  . 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 Financial resource strain: Not on file  . Food insecurity:    Worry: Not on file    Inability: Not on file  . Transportation needs:    Medical: Not on file    Non-medical: Not on file  Tobacco Use  . Smoking status: Current Every Day Smoker    Packs/day: 0.50    Years: 42.00    Pack years: 21.00    Types: Cigarettes  . Smokeless tobacco: Never Used  . Tobacco comment: 02/15/18 For tobacco cessation he is getting better.  He is staying between 6-18 cigarettes a day versus a full pack.  He is also set up to start a one on one smoking cessation class since the current classes were already in progress and did not restart un  Substance and Sexual Activity  . Alcohol use: No    Alcohol/week: 0.0 oz  . Drug use: No  . Sexual activity: Yes  Lifestyle  . Physical activity:    Days per week: Not on file    Minutes per session: Not on file  . Stress: Not on file  Relationships  . Social connections:    Talks on phone: Not on file    Gets together: Not on file    Attends religious service: Not on file    Active member of club or organization: Not on file    Attends meetings of clubs or organizations: Not on file    Relationship status: Not on file  . Intimate partner violence:    Fear of current or ex partner: Not on file    Emotionally abused: Not on file    Physically abused: Not on file    Forced sexual activity: Not on file  Other Topics Concern  . Not on file  Social History Narrative   Marital status: married x 32 years; happily married.      Children: 4 children;  1 stepchild and 15 grandchildren; no great grandchildren.      Lives: with wife; 2 dogs, 1 cat.      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       Tobacco: 1 ppd x 45 years      Alcohol: none; previous alcoholism      Drugs: none      Exercise: no formal exercise; physically demanding job; owns horses.      Seatbelts:  100%      Guns: one loaded unsecured gun in the home.      Sunscreen: none   Family History  Problem Relation Age of Onset  . Heart attack Brother        Brother #1  . Diabetes Brother   . Hypertension Brother        #3  . Coronary artery disease Father 67       deceased  . Heart attack Father   . Diabetes Father   . Heart disease Father   . COPD Mother 12       deceased  . Alcohol abuse Sister  polysubstance abuse  . COPD Sister   . Lung cancer Sister   . Alcohol abuse Sister        polysubstance abuse  . Penile cancer Brother   . Diabetes Brother        Objective:    BP 112/68   Pulse (!) 120   Temp 98 F (36.7 C) (Oral)   Resp 16   Ht 5\' 8"  (1.727 m)   Wt 137 lb (62.1 kg)   SpO2 96%   BMI 20.83 kg/m  Physical Exam  Constitutional: He is oriented to person, place, and time. He appears well-developed and well-nourished. No distress.  HENT:  Head: Normocephalic and atraumatic.  Right Ear: External ear normal.  Left Ear: External ear normal.  Nose: Nose normal.  Mouth/Throat: Oropharynx is clear and moist.  Eyes: Pupils are equal, round, and reactive to light. Conjunctivae and EOM are normal.  Neck: Normal range of motion. Neck supple. Carotid bruit is not present. No thyromegaly present.  Cardiovascular: Normal rate, regular rhythm, normal heart sounds and intact distal pulses. Exam reveals no gallop and no friction rub.  No murmur heard. Pulmonary/Chest: Effort normal and breath sounds normal. He has no wheezes. He has no rales.  Abdominal: Soft. Bowel sounds are normal. He exhibits no distension and no mass. There is no tenderness. There is no rebound and no guarding.  Musculoskeletal:       Right shoulder: Normal.       Left shoulder: Normal.       Cervical back: Normal.   Lymphadenopathy:    He has no cervical adenopathy.  Neurological: He is alert and oriented to person, place, and time. He has normal reflexes. No cranial nerve deficit. He exhibits normal muscle tone. Coordination normal.  Skin: Skin is warm and dry. No rash noted. He is not diaphoretic.  Psychiatric: His speech is normal and behavior is normal. Judgment and thought content normal. His mood appears anxious. His affect is not angry and not inappropriate. Cognition and memory are normal. He exhibits a depressed mood.  Nursing note and vitals reviewed.  No results found. Depression screen [facility] 2/9 07/07/2018 05/22/2018 04/19/2018 03/03/2018 01/24/2018  Decreased Interest 2 2 3 3 3   Down, Depressed, Hopeless 2 2 3 3 3   PHQ - 2 Score 4 4 6 6 6   Altered sleeping 2 1 3 3 3   Tired, decreased energy 2 1 3 3 3   Change in appetite 2 1 3 1 3   Feeling bad or failure about yourself  2 1 3 1 1   Trouble concentrating 0 0 1 1 3   Moving slowly or fidgety/restless 0 0 0 1 3  Suicidal thoughts 0 0 0 1 0  PHQ-9 Score 12 8 19 17 22   Difficult doing work/chores - - - - Extremely dIfficult  Some recent data might be hidden   Fall Risk  07/07/2018 05/22/2018 04/19/2018 03/03/2018 01/24/2018  Falls in the past year? No No No No No        Assessment & Plan:   1. Other chest pain   2. DEPRESSION/ANXIETY   3. Chronic systolic heart failure ([place])   4. Atherosclerosis of native coronary artery of native heart without angina pectoris   5. COPD, very severe ([place])   6. Grief reaction     Chest pain atypical: Status post hospitalization for 24 hours.  Ruled out for acute myocardial infarction.  And went repeat echocardiogram that was stable with an EF of 45%.  Status  post cardiology consultation during admission.  Patient now definitely feels that chest pain was anxiety related.  No recurrence since hospital discharge.  Major depressive episode with severe anxiety: Uncontrolled at this time.  Follow[facility] Jake Michaelis in  [facility].  Wellbutrin discontinued by Dr. Jake Michaelis.  Increased Zoloft to 100 mg daily.  Continue Remeron 30 mg at bedtime.  Patient requesting a temporary increase in lorazepam and agreeable. New rx provided for 3 times daily dosing.  Patient contracted safety with me during visit today.  Patient agreeable to having all guns removed from home.  Patient to continue weekly psychotherapy; he is also going to start group psychotherapy.  No orders of the defined types were placed in this encounter.  Meds ordered this encounter  Medications  . furosemide (LASIX) 20 MG tablet    Sig: Take 1 tablet (20 mg total) by mouth daily.    Dispense:  90 tablet    Refill:  3  . LORazepam (ATIVAN) 1 MG tablet    Sig: Take 1 tablet (1 mg total) by mouth every 8 (eight) hours as needed for anxiety.    Dispense:  90 tablet    Refill:  5    This prescription was filled on 05/12/2018. Any refills authorized will be placed on file.    Return in about 1 month (around 08/04/2018) for follow-up chronic medical conditions [place].   Mubashir Mallek Elayne Guerin, M.D. Primary Care at [name] [address] [place], [place]  [address] [phone] phone [phone] fax

## 2018-07-11 ENCOUNTER — Ambulatory Visit: Payer: BLUE CROSS/BLUE SHIELD | Admitting: Psychology

## 2018-07-11 DIAGNOSIS — F331 Major depressive disorder, recurrent, moderate: Secondary | ICD-10-CM

## 2018-07-20 ENCOUNTER — Ambulatory Visit: Payer: BLUE CROSS/BLUE SHIELD | Admitting: Psychology

## 2018-07-20 DIAGNOSIS — F411 Generalized anxiety disorder: Secondary | ICD-10-CM | POA: Diagnosis not present

## 2018-07-26 ENCOUNTER — Other Ambulatory Visit: Payer: Self-pay | Admitting: Family Medicine

## 2018-07-26 NOTE — Telephone Encounter (Signed)
Spiriva refill Last Refill:07/27/17 # 90 caps 3 RF Last OV: 07/07/18 PCP: Carles Collet PA Pharmacy: [facility]

## 2018-08-03 ENCOUNTER — Ambulatory Visit: Payer: BLUE CROSS/BLUE SHIELD | Admitting: Psychology

## 2018-08-24 ENCOUNTER — Other Ambulatory Visit: Payer: Self-pay

## 2018-08-24 ENCOUNTER — Inpatient Hospital Stay
Admission: EM | Admit: 2018-08-24 | Discharge: 2018-08-26 | DRG: 190 | Disposition: A | Discharge status: Home / Self Care | Payer: BLUE CROSS/BLUE SHIELD | Attending: Internal Medicine | Admitting: Internal Medicine

## 2018-08-24 ENCOUNTER — Encounter: Payer: Self-pay | Admitting: Emergency Medicine

## 2018-08-24 ENCOUNTER — Emergency Department: Payer: BLUE CROSS/BLUE SHIELD

## 2018-08-24 DIAGNOSIS — I252 Old myocardial infarction: Secondary | ICD-10-CM

## 2018-08-24 DIAGNOSIS — Z811 Family history of alcohol abuse and dependence: Secondary | ICD-10-CM | POA: Diagnosis not present

## 2018-08-24 DIAGNOSIS — J9622 Acute and chronic respiratory failure with hypercapnia: Secondary | ICD-10-CM | POA: Diagnosis present

## 2018-08-24 DIAGNOSIS — F17210 Nicotine dependence, cigarettes, uncomplicated: Secondary | ICD-10-CM | POA: Diagnosis present

## 2018-08-24 DIAGNOSIS — F329 Major depressive disorder, single episode, unspecified: Secondary | ICD-10-CM | POA: Diagnosis present

## 2018-08-24 DIAGNOSIS — I2510 Atherosclerotic heart disease of native coronary artery without angina pectoris: Secondary | ICD-10-CM | POA: Diagnosis present

## 2018-08-24 DIAGNOSIS — Z825 Family history of asthma and other chronic lower respiratory diseases: Secondary | ICD-10-CM

## 2018-08-24 DIAGNOSIS — G479 Sleep disorder, unspecified: Secondary | ICD-10-CM | POA: Diagnosis present

## 2018-08-24 DIAGNOSIS — E785 Hyperlipidemia, unspecified: Secondary | ICD-10-CM | POA: Diagnosis present

## 2018-08-24 DIAGNOSIS — J439 Emphysema, unspecified: Principal | ICD-10-CM | POA: Diagnosis present

## 2018-08-24 DIAGNOSIS — Z955 Presence of coronary angioplasty implant and graft: Secondary | ICD-10-CM | POA: Diagnosis not present

## 2018-08-24 DIAGNOSIS — Z833 Family history of diabetes mellitus: Secondary | ICD-10-CM

## 2018-08-24 DIAGNOSIS — R911 Solitary pulmonary nodule: Secondary | ICD-10-CM | POA: Diagnosis present

## 2018-08-24 DIAGNOSIS — J9621 Acute and chronic respiratory failure with hypoxia: Secondary | ICD-10-CM | POA: Diagnosis present

## 2018-08-24 DIAGNOSIS — Z8249 Family history of ischemic heart disease and other diseases of the circulatory system: Secondary | ICD-10-CM | POA: Diagnosis not present

## 2018-08-24 DIAGNOSIS — I255 Ischemic cardiomyopathy: Secondary | ICD-10-CM | POA: Diagnosis present

## 2018-08-24 DIAGNOSIS — Z7982 Long term (current) use of aspirin: Secondary | ICD-10-CM | POA: Diagnosis not present

## 2018-08-24 DIAGNOSIS — Z972 Presence of dental prosthetic device (complete) (partial): Secondary | ICD-10-CM

## 2018-08-24 DIAGNOSIS — Z7951 Long term (current) use of inhaled steroids: Secondary | ICD-10-CM | POA: Diagnosis not present

## 2018-08-24 DIAGNOSIS — Z79899 Other long term (current) drug therapy: Secondary | ICD-10-CM | POA: Diagnosis not present

## 2018-08-24 DIAGNOSIS — Z86010 Personal history of colonic polyps: Secondary | ICD-10-CM

## 2018-08-24 DIAGNOSIS — Z9981 Dependence on supplemental oxygen: Secondary | ICD-10-CM | POA: Diagnosis not present

## 2018-08-24 DIAGNOSIS — F419 Anxiety disorder, unspecified: Secondary | ICD-10-CM | POA: Diagnosis present

## 2018-08-24 DIAGNOSIS — Z801 Family history of malignant neoplasm of trachea, bronchus and lung: Secondary | ICD-10-CM | POA: Diagnosis not present

## 2018-08-24 DIAGNOSIS — I110 Hypertensive heart disease with heart failure: Secondary | ICD-10-CM | POA: Diagnosis present

## 2018-08-24 DIAGNOSIS — Z888 Allergy status to other drugs, medicaments and biological substances status: Secondary | ICD-10-CM

## 2018-08-24 DIAGNOSIS — K219 Gastro-esophageal reflux disease without esophagitis: Secondary | ICD-10-CM | POA: Diagnosis present

## 2018-08-24 DIAGNOSIS — G4700 Insomnia, unspecified: Secondary | ICD-10-CM | POA: Diagnosis present

## 2018-08-24 DIAGNOSIS — I5022 Chronic systolic (congestive) heart failure: Secondary | ICD-10-CM | POA: Diagnosis present

## 2018-08-24 DIAGNOSIS — Z8711 Personal history of peptic ulcer disease: Secondary | ICD-10-CM

## 2018-08-24 DIAGNOSIS — Z8049 Family history of malignant neoplasm of other genital organs: Secondary | ICD-10-CM

## 2018-08-24 LAB — BASIC METABOLIC PANEL
Anion gap: 3 — ABNORMAL LOW (ref 5–15)
BUN: 12 mg/dL (ref 8–23)
CALCIUM: 9.3 mg/dL (ref 8.9–10.3)
CO2: 38 mmol/L — ABNORMAL HIGH (ref 22–32)
CREATININE: 0.83 mg/dL (ref 0.61–1.24)
Chloride: 98 mmol/L (ref 98–111)
GFR calc Af Amer: >60 mL/min (ref >60)
Glucose, Bld: 102 mg/dL — ABNORMAL HIGH (ref 70–99)
POTASSIUM: 4.2 mmol/L (ref 3.5–5.1)
SODIUM: 139 mmol/L (ref 135–145)

## 2018-08-24 LAB — CBC
HCT: 45.6 % (ref 40.0–52.0)
Hemoglobin: 15.3 g/dL (ref 13.0–18.0)
MCH: 30.9 pg (ref 26.0–34.0)
MCHC: 33.6 g/dL (ref 32.0–36.0)
MCV: 91.9 fL (ref 80.0–100.0)
PLATELETS: 272 10*3/uL (ref 150–440)
RBC: 4.96 MIL/uL (ref 4.40–5.90)
RDW: 13.6 % (ref 11.5–14.5)
WBC: 8.1 10*3/uL (ref 3.8–10.6)

## 2018-08-24 LAB — TROPONIN I

## 2018-08-24 IMAGING — CR DG CHEST 2V
1 series · 2 of 2 positions shown · non-contrast
Comparison: [DATE], [DATE], CT chest [DATE]

CLINICAL DATA: Short of breath

EXAM:
CHEST - 2 VIEW

[Series 1: dg chest 2 view · 0.14mm/px · 2 of 2 slices shown]
[im 1/2]
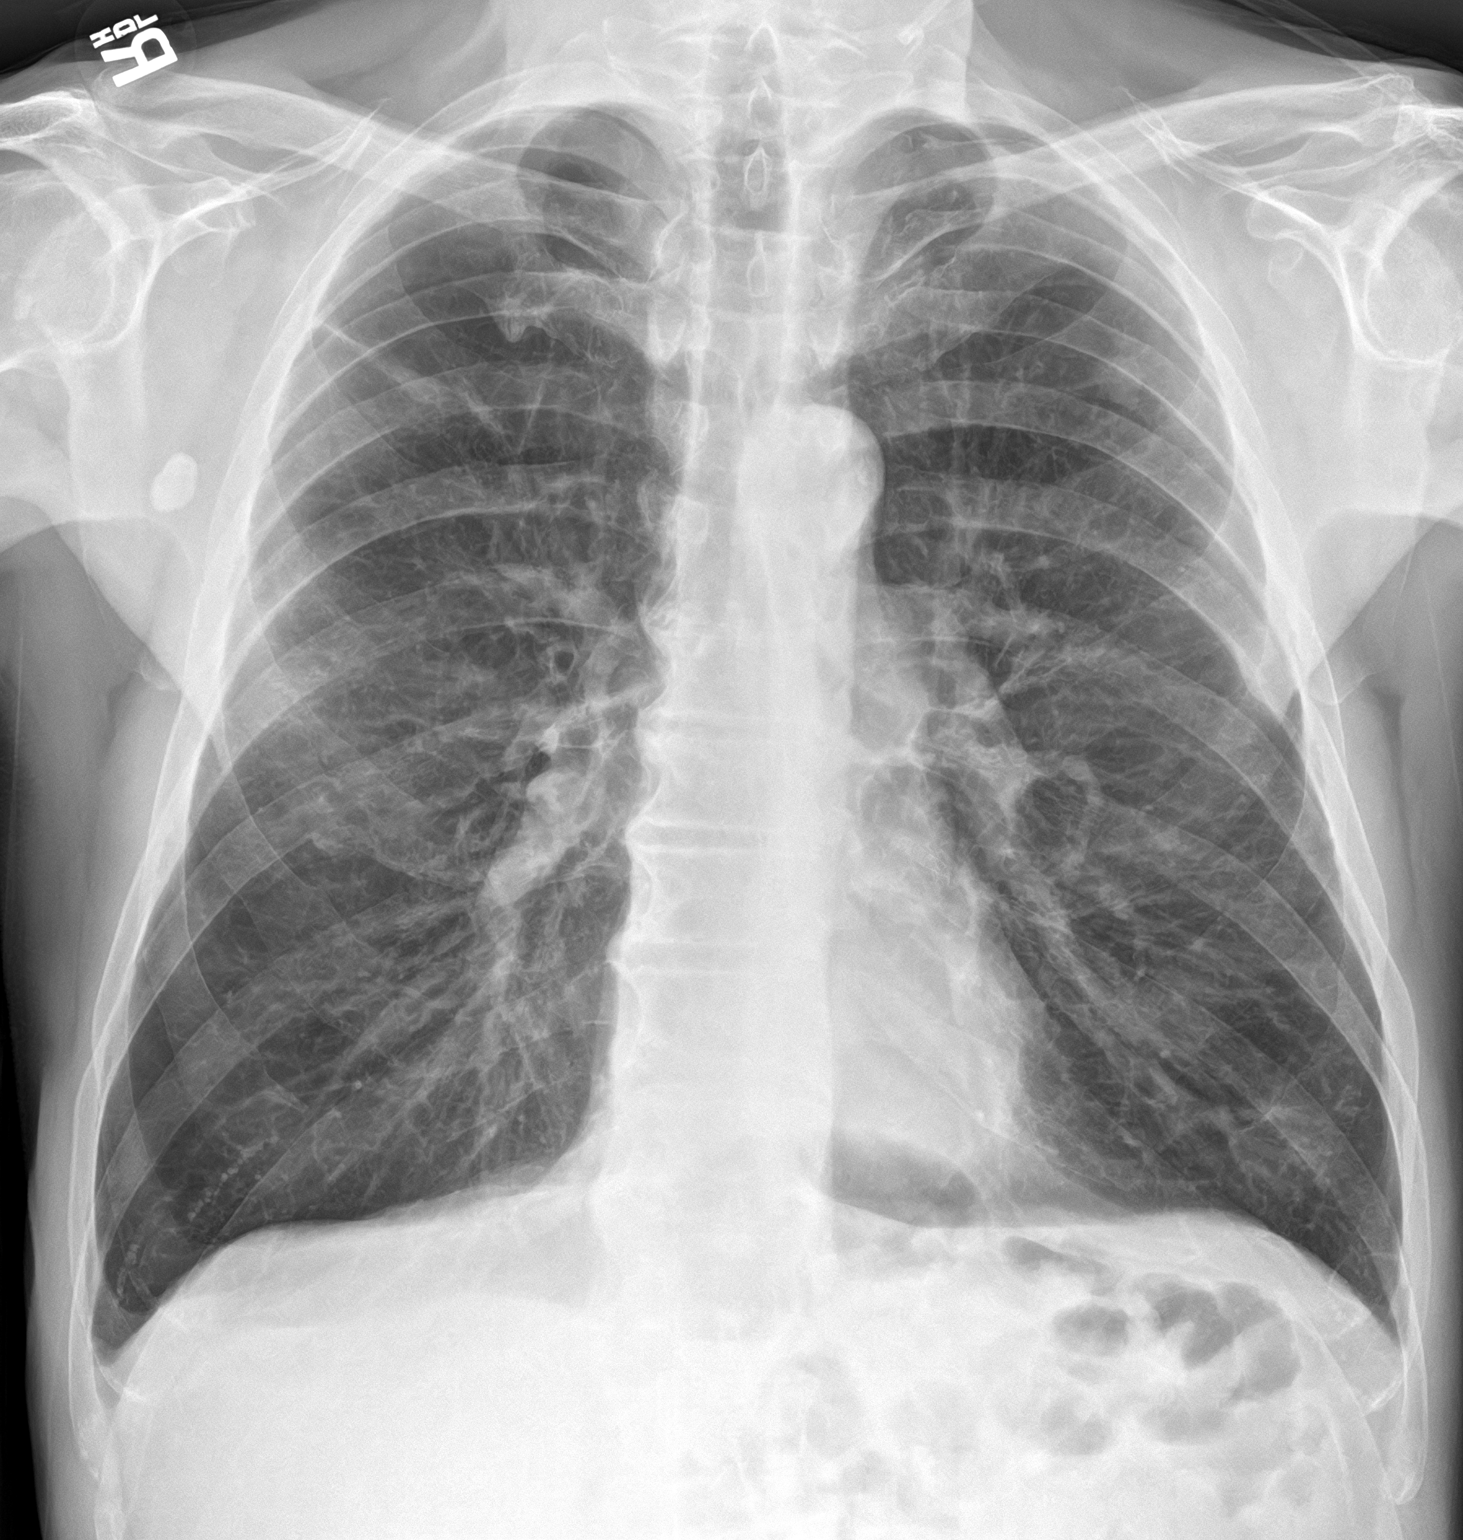
[im 2/2]
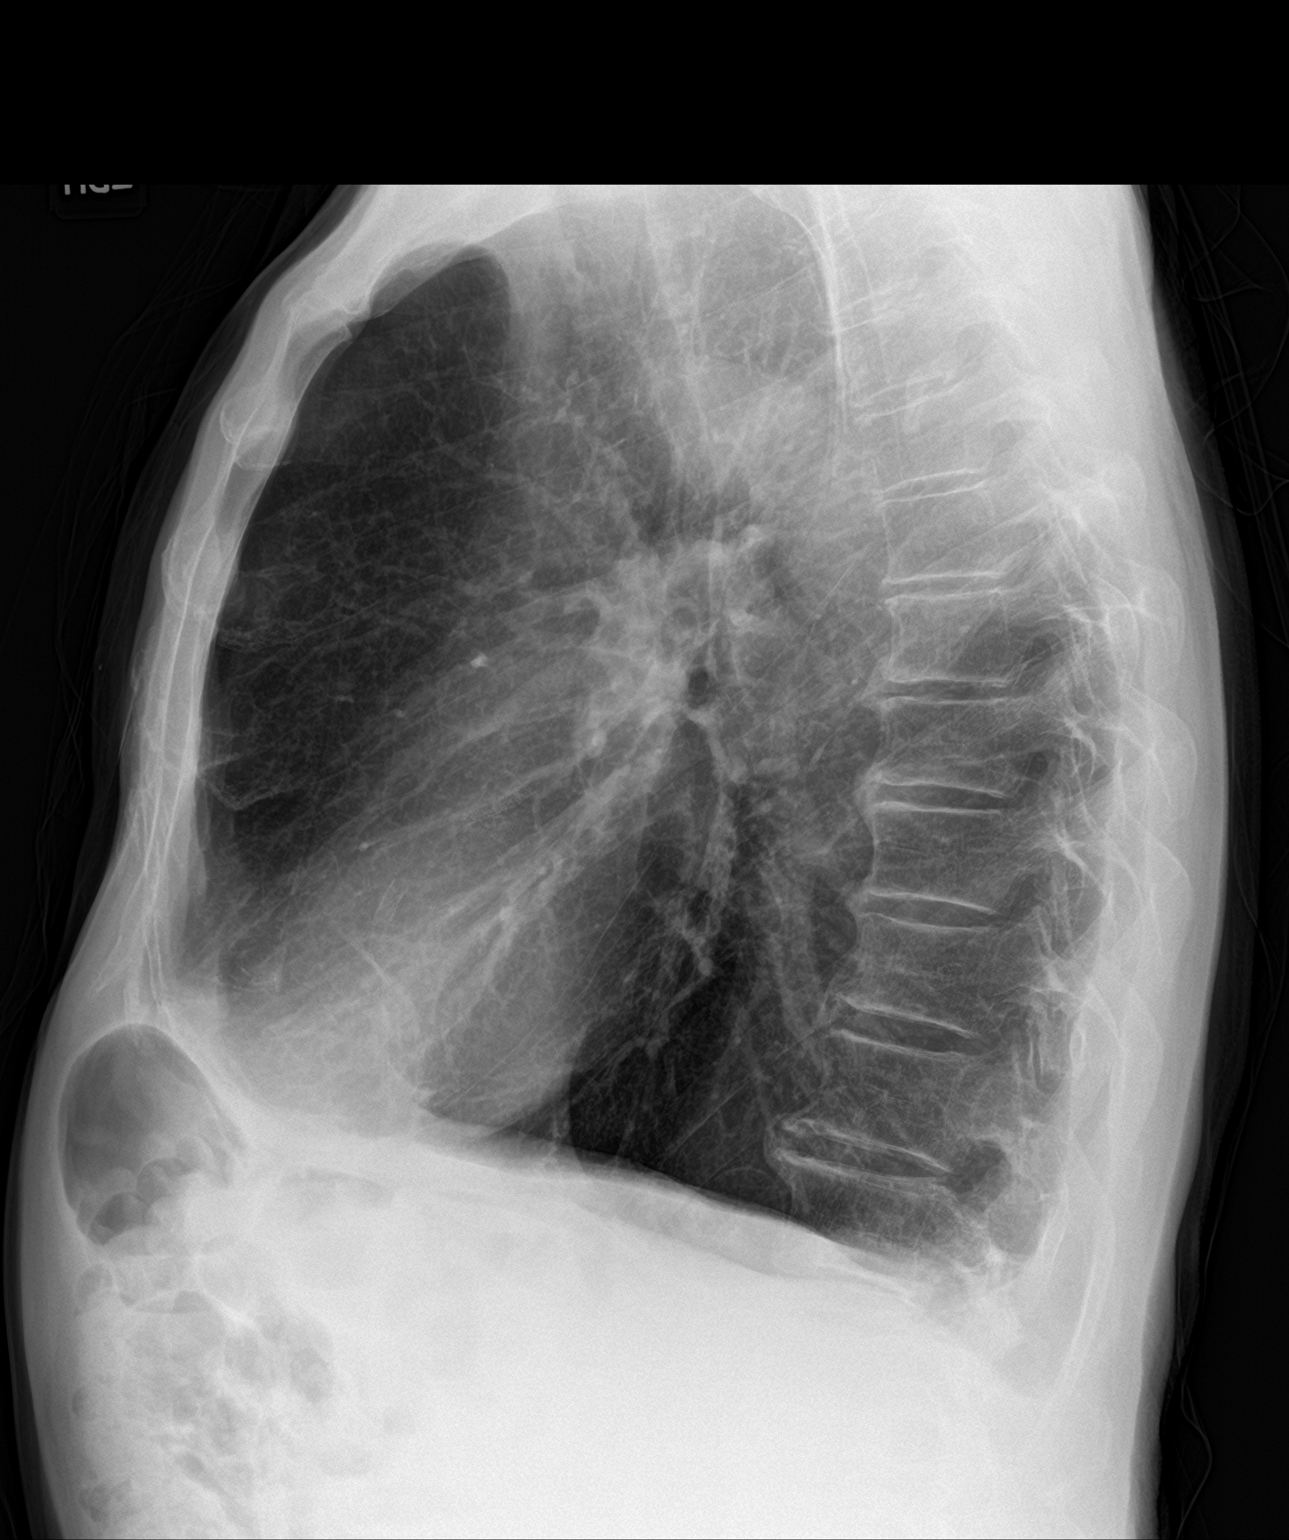

[2 of 2 positions shown; findings below may reference images not displayed]

FINDINGS: Hyperinflation with mild bronchitic changes. Emphysematous disease.
No acute consolidation or pleural effusion. Cardiomediastinal
silhouette within normal limits. Atelectasis or scar in the right
upper lobe. 12 mm left upper lobe lung nodule, no change.
IMPRESSION: 1. Hyperinflation with emphysematous disease and bronchitic changes.
2. Stable left upper lobe lung nodule

## 2018-08-24 MED ORDER — IPRATROPIUM-ALBUTEROL 0.5-2.5 (3) MG/3ML IN SOLN
3.0000 mL | Freq: Once | RESPIRATORY_TRACT | Status: AC
  Administered 2018-08-24: 3 mL via RESPIRATORY_TRACT
  Filled 2018-08-24: qty 3

## 2018-08-24 MED ORDER — ALBUTEROL SULFATE (2.5 MG/3ML) 0.083% IN NEBU
5.0000 mg | INHALATION_SOLUTION | Freq: Once | RESPIRATORY_TRACT | Status: AC
  Administered 2018-08-24: 5 mg via RESPIRATORY_TRACT
  Filled 2018-08-24: qty 6

## 2018-08-24 MED ORDER — PREDNISONE 20 MG PO TABS
60.0000 mg | ORAL_TABLET | Freq: Once | ORAL | Status: AC
  Administered 2018-08-24: 60 mg via ORAL
  Filled 2018-08-24: qty 3

## 2018-08-24 NOTE — ED Notes (Signed)
Date and time results received: 08/24/18 2011 (use smartphrase ".now" to insert current time)  Test: [facility] Critical Value: 7.3  Name of Provider Notified: Quentin Cornwall  Orders Received? Or Actions Taken?: Orders Received - See Orders for details

## 2018-08-24 NOTE — ED Notes (Signed)
Respiratory at bedside.

## 2018-08-24 NOTE — ED Triage Notes (Signed)
FIRST NURSE NOTE-here for difficulty breathing.  On home O2. Pulled next for triage.

## 2018-08-24 NOTE — ED Notes (Signed)
Date and time results received: 08/24/18 2011 (use smartphrase ".now" to insert current time)  Test: CO2 Critical Value: 73  Name of Provider Notified: Quentin Cornwall  Orders Received? Or Actions Taken?: Orders Received - See Orders for details

## 2018-08-24 NOTE — ED Notes (Signed)
Called respiratory per MD Marcille Blanco to take patient off bi-pap to see if he could tolerate 2L nasal cannula. Resp at bedside.

## 2018-08-24 NOTE — ED Provider Notes (Signed)
[facility] Emergency Department Provider Note    First MD Initiated Contact with Patient 08/24/18 1935     (approximate)  I have reviewed the triage vital signs and the nursing notes.   HISTORY  Chief Complaint Shortness of Breath    HPI Leslie Sims is a 67 y.o. male extensive history of COPD and CAD on home O2 presents the ER with 2 weeks of worsening shortness of breath.  Is completed a course of doxycycline and a brief steroid burst but is still feeling short of breath.  States he did feel some improvement after the steroids but the cough and shortness of breath returned after he completed one course.  Denies any fevers.  No chest pain.  Feels similar to previous episodes and exacerbations of his COPD and emphysema.  Denies any leg swelling.  States he does still smoke but is working on quitting.    Past Medical History:  Diagnosis Date  . Allergic rhinitis   . Anxiety   . Back pain   . Colon polyps 08/2012   colonoscopy; multiple colon polyps; repeat colonoscopy in one year.  [name] COPD (chronic obstructive pulmonary disease) ([place])   . Coronary artery disease 11/2009   a. late presenting ant MI; b. LHC 100% pLAD s/p PCI/DES, 99% mRCA s/p PCI/DES, EF 35%; c. nuclear stress test 05/13: prior ant/inf infarcts w/o ischemia, EF 43%; d. LHC 02/14: widely patent stents with no other obs dz, EF 40%; e. LHC 11/17: LM nl, mLAD 10%, patent LAD stent, dLAD 20%, p-mRCA 10%, mRCA 40%, patent RCA stent, EF 35-45%   . Depression   . GERD (gastroesophageal reflux disease)   . Headache(784.0)   . Helicobacter pylori (H. pylori)   . Hemorrhoids   . Hernia    inguinal  . Hyperlipidemia   . Hypertension   . Insomnia   . Ischemic cardiomyopathy   . Myalgia   . Peptic ulcer   . ST elevation (STEMI) myocardial infarction involving left anterior descending coronary artery ([place]) 11/2009   a. s/p PCI to the LAD  . Status post dilation of esophageal narrowing 2000    . Tinea pedis   . Wears dentures    partial upper   Family History  Problem Relation Age of Onset  . Heart attack Brother        Brother #1  . Diabetes Brother   . Hypertension Brother        #3  . Coronary artery disease Father 67       deceased  . Heart attack Father   . Diabetes Father   . Heart disease Father   . COPD Mother 72       deceased  . Alcohol abuse Sister        polysubstance abuse  . COPD Sister   . Lung cancer Sister   . Alcohol abuse Sister        polysubstance abuse  . Penile cancer Brother   . Diabetes Brother    Past Surgical History:  Procedure Laterality Date  . Admission  12/20/2012   COPD exacerbation.  [place].  [name] CARDIAC CATHETERIZATION    . CARDIAC CATHETERIZATION  02/05/13   [facility]  . CARDIAC CATHETERIZATION  10/14   [facility] : patent stents with no change in anatomy. EF: 40$  . CARDIAC CATHETERIZATION Left 11/12/2016   Procedure: Left Heart Cath and Coronary Angiography;  Surgeon: Wellington Hampshire, MD;  Location: [place] [facility];  Service: Cardiovascular;  Laterality: Left;  . COLONOSCOPY    . COLONOSCOPY WITH PROPOFOL N/A 05/18/2016   Procedure: COLONOSCOPY WITH PROPOFOL;  Surgeon: Lucilla Lame, MD;  Location: [facility] ENDOSCOPY;  Service: Endoscopy;  Laterality: N/A;  . CORONARY ANGIOPLASTY WITH STENT PLACEMENT  09/2009   LAD 3.0 X23 mm Xience DES, RCA: 4.0 X 15 mm Xience DES  . ELECTROMAGNETIC NAVIGATION BROCHOSCOPY N/A 10/27/2017   Procedure: ELECTROMAGNETIC NAVIGATION BRONCHOSCOPY;  Surgeon: Flora Lipps, MD;  Location: [facility] ORS;  Service: Cardiopulmonary;  Laterality: N/A;  . ESOPHAGEAL DILATION    . ESOPHAGOGASTRODUODENOSCOPY  2008  . ESOPHAGOGASTRODUODENOSCOPY  08/20/2012  . HERNIA REPAIR  07/20/2012   L inguinal hernia repair  . PENILE PROSTHESIS IMPLANT     Patient Active Problem List   Diagnosis Date Noted  . Chest pain 07/02/2018  . Grief reaction 01/13/2018  . COPD with hypoxia ([place]) 12/25/2017  . Unstable angina ([place])   . Benign  neoplasm of ascending colon   . Benign neoplasm of descending colon   . Benign neoplasm of sigmoid colon   . Personal history of colonic polyps   . Pulmonary nodules/lesions, multiple 01/05/2015  . Bruit of left carotid artery 11/04/2014  . Sleep disorder 07/18/2013  . Seasonal and perennial allergic rhinitis 05/29/2013  . Bradycardia 04/20/2011  . SMOKER 07/30/2010  . CAD, NATIVE VESSEL 04/23/2010  . Hyperlipidemia 03/24/2010  . DEPRESSION/ANXIETY 03/24/2010  . Chronic systolic heart failure ([place]) 03/24/2010  . COPD, very severe ([place]) 03/24/2010  . GERD 03/24/2010  . Essential hypertension 11/21/2009      Prior to Admission medications   Medication Sig Start Date End Date Taking? Authorizing Provider  albuterol (VENTOLIN HFA) 108 (90 Base) MCG/ACT inhaler USE 2 INHALATIONS EVERY 6 HOURS AS NEEDED FOR WHEEZING 08/19/17   [name], MD  aspirin 81 MG tablet Take 81 mg by mouth daily.      [provider]  atorvastatin (LIPITOR) 40 MG tablet Take 1 tablet (40 mg total) by mouth daily. 03/03/18   [name], MD  Fluticasone-Salmeterol (ADVAIR DISKUS) 500-50 MCG/DOSE AEPB Inhale 1 puff into the lungs 2 (two) times daily. 07/27/17   [name], MD  furosemide (LASIX) 20 MG tablet Take 1 tablet (20 mg total) by mouth daily. 07/07/18 08/06/18  [name], MD  hydrOXYzine (VISTARIL) 25 MG capsule Take 25 mg by mouth 2 (two) times daily as needed. 06/05/18   [provider]  ipratropium-albuterol (DUONEB) 0.5-2.5 (3) MG/3ML SOLN Take 3 mLs by nebulization every 4 (four) hours as needed. 06/24/18   [name], MD  LORazepam (ATIVAN) 1 MG tablet Take 1 tablet (1 mg total) by mouth every 8 (eight) hours as needed for anxiety. 07/07/18   [name], MD  losartan (COZAAR) 25 MG tablet TAKE 1 TABLET DAILY 01/27/18   Minna Merritts, MD  mirtazapine (REMERON) 30 MG tablet Take 1 tablet (30 mg total) by mouth at bedtime. 06/24/18   [name], MD  Multiple  Vitamin (MULTIVITAMIN) capsule Take 1 capsule by mouth daily.    [provider]  nitroGLYCERIN (NITROSTAT) 0.4 MG SL tablet Place 1 tablet (0.4 mg total) under the tongue every 5 (five) minutes as needed. 10/17/17   Wellington Hampshire, MD  sertraline (ZOLOFT) 100 MG tablet TAKE ONE TABLET BY MOUTH EVERY MORNING WITH BREAKFAST 06/28/18   [provider]  SPIRIVA HANDIHALER 18 MCG inhalation capsule PLACE 1 CAPSULE INTO INHALER AND INHALE THE CONTENTS DAILY  07/26/18   Trinna Post, PA-C  sucralfate (CARAFATE) 1 GM/10ML suspension Take 10 mLs (1 g total) by mouth 4 (four) times daily -  with meals and at bedtime. Patient taking differently: Take 1 g by mouth 4 (four) times daily as needed.  05/16/18   [name], MD    Allergies Prozac [fluoxetine hcl]; Effexor xr [venlafaxine hcl er]; and Wellbutrin [bupropion]    Social History Social History   Tobacco Use  . Smoking status: Current Every Day Smoker    Packs/day: 0.50    Years: 42.00    Pack years: 21.00    Types: Cigarettes  . Smokeless tobacco: Never Used  Substance Use Topics  . Alcohol use: No    Alcohol/week: 0.0 standard drinks  . Drug use: No    Review of Systems Patient denies headaches, rhinorrhea, blurry vision, numbness, shortness of breath, chest pain, edema, cough, abdominal pain, nausea, vomiting, diarrhea, dysuria, fevers, rashes or hallucinations unless otherwise stated above in HPI. ____________________________________________   PHYSICAL EXAM:  VITAL SIGNS: Vitals:   08/24/18 2130 08/24/18 2253  BP: 110/71 115/81  Pulse: 75   Resp: 13   Temp:    SpO2: 93%     Constitutional: Alert and oriented.  Eyes: Conjunctivae are normal.  Head: Atraumatic. Nose: No congestion/rhinnorhea. Mouth/Throat: Mucous membranes are moist.   Neck: No stridor. Painless ROM.  Cardiovascular: Normal rate, regular rhythm. Grossly normal heart sounds.  Good peripheral circulation. Respiratory:  prolonged expiratory phase, diminished BS throughout with faint wheeze Gastrointestinal: Soft and nontender. No distention. No abdominal bruits. No CVA tenderness. Genitourinary:  Musculoskeletal: No lower extremity tenderness nor edema.  No joint effusions. Neurologic:  Normal speech and language. No gross focal neurologic deficits are appreciated. No facial droop Skin:  Skin is warm, dry and intact. No rash noted. Psychiatric: Mood and affect are normal. Speech and behavior are normal.  ____________________________________________   LABS (all labs ordered are listed, but only abnormal results are displayed)  Results for orders placed or performed during the hospital encounter of 08/24/18 (from the past 24 hour(s))  Basic metabolic panel     Status: Abnormal   Collection Time: 08/24/18  5:15 PM  Result Value Ref Range   Sodium 139 135 - 145 mmol/L   Potassium 4.2 3.5 - 5.1 mmol/L   Chloride 98 98 - 111 mmol/L   CO2 38 (H) 22 - 32 mmol/L   Glucose, Bld 102 (H) 70 - 99 mg/dL   BUN 12 8 - 23 mg/dL   Creatinine, Ser 0.83 0.61 - 1.24 mg/dL   Calcium 9.3 8.9 - 10.3 mg/dL   GFR calc non Af Amer >60 >60 mL/min   GFR calc Af Amer >60 >60 mL/min   Anion gap 3 (L) 5 - 15  CBC     Status: None   Collection Time: 08/24/18  5:15 PM  Result Value Ref Range   WBC 8.1 3.8 - 10.6 K/uL   RBC 4.96 4.40 - 5.90 MIL/uL   Hemoglobin 15.3 13.0 - 18.0 g/dL   HCT 45.6 40.0 - 52.0 %   MCV 91.9 80.0 - 100.0 fL   MCH 30.9 26.0 - 34.0 pg   MCHC 33.6 32.0 - 36.0 g/dL   RDW 13.6 11.5 - 14.5 %   Platelets 272 150 - 440 K/uL  Troponin I     Status: None   Collection Time: 08/24/18  5:34 PM  Result Value Ref Range   Troponin I <0.03 <0.03 ng/mL  ____________________________________________  EKG My review and personal interpretation at Time: 16:42   Indication: sob  Rate: 85  Rhythm: sinus Axis: normal Other: poor r wave progression, pulmonary disease  pattern ____________________________________________  RADIOLOGY  I personally reviewed all radiographic images ordered to evaluate for the above acute complaints and reviewed radiology reports and findings.  These findings were personally discussed with the patient.  Please see medical record for radiology report.  ____________________________________________   PROCEDURES  Procedure(s) performed:  .Critical Care Performed by: Merlyn Lot, MD Authorized by: Merlyn Lot, MD   Critical care provider statement:    Critical care time (minutes):  30   Critical care was necessary to treat or prevent imminent or life-threatening deterioration of the following conditions:  Respiratory failure   Critical care was time spent personally by me on the following activities:  Discussions with consultants, evaluation of patient's response to treatment, examination of patient, ordering and performing treatments and interventions, ordering and review of laboratory studies, ordering and review of radiographic studies, pulse oximetry, re-evaluation of patient's condition, obtaining history from patient or surrogate and review of old charts      Critical Care performed: yes ____________________________________________   INITIAL IMPRESSION / ASSESSMENT AND PLAN / ED COURSE  Pertinent labs & imaging results that were available during my care of the patient were reviewed by me and considered in my medical decision making (see chart for details).   DDX: Asthma, copd, CHF, pna, ptx, malignancy, Pe, anemia   Leslie Sims is a 67 y.o. who presents to the ED with extensive history of COPD on home O2.  Patient protecting his airway.  Does report increasing fatigue and grogginess for the past few days.  Will check VBG.  Chest x-ray shows no evidence of pneumonic pneumonia or consolidation.  No evidence of pneumothorax.  Will give nebulizer treatments.  Blood work otherwise reassuring.  Will  give steroids.  Clinical Course as of Aug 24 2301  Thu Aug 24, 2018  2113 Noted hypercapnia.  Will start bipap.   [PR]    Clinical Course User Index [PR] Merlyn Lot, MD   Patient will require hospitalization for COPD exacerbation and acute on chronic respiratory failure with hypoxia and hypercapnia.  As part of my medical decision making, I reviewed the following data within the [place] notes reviewed and incorporated, Labs reviewed, notes from prior ED visits .  ____________________________________________   FINAL CLINICAL IMPRESSION(S) / ED DIAGNOSES  Final diagnoses:  Acute on chronic respiratory failure with hypoxia and hypercapnia (HCC)      NEW MEDICATIONS STARTED DURING THIS VISIT:  New Prescriptions   No medications on file     Note:  This document was prepared using Dragon voice recognition software and may include unintentional dictation errors.    Merlyn Lot, MD 08/24/18 2303

## 2018-08-24 NOTE — ED Triage Notes (Signed)
Pt in via POV with complaints increasing shortness of breath x 2 weeks, reports currently on antibiotic and steroid treatment per PCP but without any relief.  Pt w/ hx COPD, pt still a smoker, states, "I am quitting."  Pt reports having to use his oxygen during the day, baseline home O2 therapy is at night only.  Pt arrives on 2.5L nasal cannula.  Vitals WDL.

## 2018-08-25 ENCOUNTER — Ambulatory Visit: Payer: Self-pay | Admitting: Nurse Practitioner

## 2018-08-25 ENCOUNTER — Other Ambulatory Visit: Payer: Self-pay

## 2018-08-25 LAB — TSH: TSH: 2.882 u[IU]/mL (ref 0.350–4.500)

## 2018-08-25 MED ORDER — ACETAMINOPHEN 325 MG PO TABS: 650.0000 mg | ORAL_TABLET | Freq: Four times a day (QID) | ORAL | Status: DC | PRN

## 2018-08-25 MED ORDER — MULTIVITAMINS PO CAPS: 1.0000 | ORAL_CAPSULE | Freq: Every day | ORAL | Status: DC

## 2018-08-25 MED ORDER — HYDROXYZINE HCL 25 MG PO TABS: 25.0000 mg | ORAL_TABLET | Freq: Two times a day (BID) | ORAL | Status: DC | PRN

## 2018-08-25 MED ORDER — TIOTROPIUM BROMIDE MONOHYDRATE 18 MCG IN CAPS
18.0000 ug | ORAL_CAPSULE | Freq: Every day | RESPIRATORY_TRACT | Status: DC
  Administered 2018-08-25 – 2018-08-26 (×2): 18 ug via RESPIRATORY_TRACT
  Filled 2018-08-25: qty 5

## 2018-08-25 MED ORDER — ENOXAPARIN SODIUM 40 MG/0.4ML ~~LOC~~ SOLN
40.0000 mg | SUBCUTANEOUS | Status: DC
  Administered 2018-08-25: 40 mg via SUBCUTANEOUS
  Filled 2018-08-25: qty 0.4

## 2018-08-25 MED ORDER — HYDROXYZINE PAMOATE 25 MG PO CAPS: 25.0000 mg | ORAL_CAPSULE | Freq: Two times a day (BID) | ORAL | Status: DC | PRN

## 2018-08-25 MED ORDER — SERTRALINE HCL 50 MG PO TABS
100.0000 mg | ORAL_TABLET | Freq: Every day | ORAL | Status: DC
  Administered 2018-08-25 – 2018-08-26 (×2): 100 mg via ORAL
  Filled 2018-08-25 (×3): qty 2

## 2018-08-25 MED ORDER — ACETAMINOPHEN 650 MG RE SUPP: 650.0000 mg | Freq: Four times a day (QID) | RECTAL | Status: DC | PRN

## 2018-08-25 MED ORDER — NITROGLYCERIN 0.4 MG SL SUBL: 0.4000 mg | SUBLINGUAL_TABLET | SUBLINGUAL | Status: DC | PRN

## 2018-08-25 MED ORDER — LORAZEPAM 1 MG PO TABS
1.0000 mg | ORAL_TABLET | Freq: Three times a day (TID) | ORAL | Status: DC | PRN
  Administered 2018-08-25 – 2018-08-26 (×3): 1 mg via ORAL
  Filled 2018-08-25 (×4): qty 1

## 2018-08-25 MED ORDER — LOSARTAN POTASSIUM 25 MG PO TABS
25.0000 mg | ORAL_TABLET | Freq: Every day | ORAL | Status: DC
  Administered 2018-08-25 – 2018-08-26 (×2): 25 mg via ORAL
  Filled 2018-08-25 (×3): qty 1

## 2018-08-25 MED ORDER — PREDNISONE 50 MG PO TABS
50.0000 mg | ORAL_TABLET | Freq: Every day | ORAL | Status: DC
  Administered 2018-08-25 – 2018-08-26 (×2): 50 mg via ORAL
  Filled 2018-08-25 (×2): qty 1

## 2018-08-25 MED ORDER — DOCUSATE SODIUM 100 MG PO CAPS
100.0000 mg | ORAL_CAPSULE | Freq: Two times a day (BID) | ORAL | Status: DC
  Administered 2018-08-25 – 2018-08-26 (×3): 100 mg via ORAL
  Filled 2018-08-25 (×4): qty 1

## 2018-08-25 MED ORDER — ASPIRIN 81 MG PO CHEW
81.0000 mg | CHEWABLE_TABLET | Freq: Every day | ORAL | Status: DC
  Administered 2018-08-25 – 2018-08-26 (×2): 81 mg via ORAL
  Filled 2018-08-25 (×3): qty 1

## 2018-08-25 MED ORDER — FUROSEMIDE 20 MG PO TABS
20.0000 mg | ORAL_TABLET | Freq: Every day | ORAL | Status: DC
  Administered 2018-08-25 – 2018-08-26 (×2): 20 mg via ORAL
  Filled 2018-08-25 (×3): qty 1

## 2018-08-25 MED ORDER — MIRTAZAPINE 15 MG PO TABS
30.0000 mg | ORAL_TABLET | Freq: Every day | ORAL | Status: DC
  Administered 2018-08-25 (×2): 30 mg via ORAL
  Filled 2018-08-25 (×2): qty 2

## 2018-08-25 MED ORDER — ONDANSETRON HCL 4 MG/2ML IJ SOLN: 4.0000 mg | Freq: Four times a day (QID) | INTRAMUSCULAR | Status: DC | PRN

## 2018-08-25 MED ORDER — SUCRALFATE 1 GM/10ML PO SUSP
1.0000 g | Freq: Three times a day (TID) | ORAL | Status: DC
  Administered 2018-08-25 – 2018-08-26 (×6): 1 g via ORAL
  Filled 2018-08-25 (×8): qty 10

## 2018-08-25 MED ORDER — ALBUTEROL SULFATE (2.5 MG/3ML) 0.083% IN NEBU
2.5000 mg | INHALATION_SOLUTION | RESPIRATORY_TRACT | Status: DC
  Administered 2018-08-25 – 2018-08-26 (×8): 2.5 mg via RESPIRATORY_TRACT
  Filled 2018-08-25 (×7): qty 3

## 2018-08-25 MED ORDER — ATORVASTATIN CALCIUM 20 MG PO TABS
40.0000 mg | ORAL_TABLET | Freq: Every day | ORAL | Status: DC
  Administered 2018-08-25 – 2018-08-26 (×2): 40 mg via ORAL
  Filled 2018-08-25 (×3): qty 2

## 2018-08-25 MED ORDER — ONDANSETRON HCL 4 MG PO TABS: 4.0000 mg | ORAL_TABLET | Freq: Four times a day (QID) | ORAL | Status: DC | PRN

## 2018-08-25 MED ORDER — FLUTICASONE FUROATE-VILANTEROL 200-25 MCG/INH IN AEPB
1.0000 | INHALATION_SPRAY | Freq: Every day | RESPIRATORY_TRACT | Status: DC
  Administered 2018-08-25: 1 via RESPIRATORY_TRACT
  Filled 2018-08-25: qty 28

## 2018-08-25 MED ORDER — ADULT MULTIVITAMIN W/MINERALS CH
1.0000 | ORAL_TABLET | Freq: Every day | ORAL | Status: DC
  Administered 2018-08-25 – 2018-08-26 (×2): 1 via ORAL
  Filled 2018-08-25 (×3): qty 1

## 2018-08-25 NOTE — Progress Notes (Signed)
[place] [facility] [place] NAME: Izaya Netherton    MR#:  [id number]  DATE OF BIRTH:  14-Nov-1951  SUBJECTIVE:  States he is still feeling short of breath with ambulation. Trying to get up and move around the room. Also having some pretty bad anxiety and depression this morning. Denies SI.  REVIEW OF SYSTEMS:  Review of Systems  Constitutional: Negative for chills and fever.  HENT: Negative for congestion and sore throat.   Eyes: Negative for blurred vision and double vision.  Respiratory: Positive for shortness of breath and wheezing. Negative for cough.   Cardiovascular: Negative for chest pain, palpitations and leg swelling.  Gastrointestinal: Negative for abdominal pain, nausea and vomiting.  Genitourinary: Negative for dysuria and frequency.  Musculoskeletal: Negative for back pain and neck pain.  Neurological: Negative for dizziness and headaches.  Psychiatric/Behavioral: Negative for depression. The patient is not nervous/anxious.     DRUG ALLERGIES:   Allergies  Allergen Reactions  . Prozac [Fluoxetine Hcl] Shortness Of Breath  . Effexor Xr [Venlafaxine Hcl Er] Other (See Comments)    "Makes me feel funny"  . Wellbutrin [Bupropion] Other (See Comments)    "Makes me feel funny"   VITALS:  Blood pressure (!) 142/75, pulse 84, temperature 98.1 F (36.7 C), temperature source Oral, resp. rate 15, height 5\' 10"  (1.778 m), weight 63.5 kg, SpO2 99 %. PHYSICAL EXAMINATION:  Physical Exam  Constitutional: He is oriented to person, place, and time. He appears well-developed and well-nourished. No distress.  HEENT: Normocephalic and atraumatic. Oropharynx is clear and moist. Pupils are equal, round, and reactive to light. Conjunctivae and EOM are normal. No scleral icterus.  Neck: Normal range of motion. Neck supple. No JVD present. No tracheal deviation present. No thyromegaly present.  Cardiovascular: Normal rate, regular rhythm and  normal heart sounds. No murmurs, rubs, or gallops. Respiratory: Normal work of breathing. +expiratory wheezing throughout all lung fields. No crackles. [place] in place. GI: Soft. Bowel sounds are normal. He exhibits no distension. There is no tenderness.  Musculoskeletal: Normal range of motion. He exhibits no edema.  Neurological: He is alert and oriented to person, place, and time. No cranial nerve deficit.  Skin: Skin is warm and dry. No rash noted. No erythema.  Psychiatric: flat affect, becomes tearful when talking about his deceased wife. LABORATORY PANEL:  Male CBC Recent Labs  Lab 08/24/18 1715  WBC 8.1  HGB 15.3  HCT 45.6  PLT 272   ------------------------------------------------------------------------------------------------------------------ Chemistries  Recent Labs  Lab 08/24/18 1715  NA 139  K 4.2  CL 98  CO2 38*  GLUCOSE 102*  BUN 12  CREATININE 0.83  CALCIUM 9.3   RADIOLOGY:  Dg Chest 2 View  Result Date: 08/24/2018 CLINICAL DATA:  Short of breath EXAM: CHEST - 2 VIEW COMPARISON:  07/02/2018, 05/22/2018, CT chest 06/01/2017 FINDINGS: Hyperinflation with mild bronchitic changes. Emphysematous disease. No acute consolidation or pleural effusion. Cardiomediastinal silhouette within normal limits. Atelectasis or scar in the right upper lobe. 12 mm left upper lobe lung nodule, no change. IMPRESSION: 1. Hyperinflation with emphysematous disease and bronchitic changes. 2. Stable left upper lobe lung nodule Electronically Signed   By: Donavan Foil M.D.   On: 08/24/2018 17:56   ASSESSMENT AND PLAN:   Acute on chronic hypoxic hypercapnic respiratory failure- secondary to COPD exacerbation. Initially on BiPAP, but weaned to [facility]. On 2L O2 today (uses 2L O2 intermittently at home), but becomes very short of breath with  ambulation. - continue supplemental O2 - continue spiriva and breo ellipta - continue albuterol nebs - continue prednisone 50mg  daily x 5  days  Depression/anxiety- very tearful this morning due to worsening depression. Follows with outpatient psych, who are adjusting meds. No SI. - declines inpatient psych consult - would like to f/u with his psychiatrist on discharge because they have a good relationship - continue home hydroxyzine prn, ativan prn, remeron, zoloft  CAD- stable, no active chest pain - continue aspirin  Hypertension- normotensive today - continue losartan  Chronic systolic CHF- stable, no signs of volume overload - continue home lasix 20mg  daily   Hyperlipidemia- stable - continue home lipitor  All the records are reviewed and case discussed with Care Management/Social Worker. Management plans discussed with the patient, family and they are in agreement.  CODE STATUS: Full Code  TOTAL TIME TAKING CARE OF THIS PATIENT: 35 minutes.   More than 50% of the time was spent in counseling/coordination of care: YES  POSSIBLE D/C tomorrow, DEPENDING ON CLINICAL CONDITION.   Berna Spare Mayo M.D on 08/25/2018 at 3:26 PM  Between 7am to 6pm - Pager - [phone]  After 6pm go to [email or web address] - Proofreader  [employer] [facility]  Office  [phone]  CC: Primary care physician; Patient, No Pcp Per  Note: This dictation was prepared with Dragon dictation along with smaller phrase technology. Any transcriptional errors that result from this process are unintentional.

## 2018-08-25 NOTE — Plan of Care (Signed)
  Problem: Education: Goal: Knowledge of General Education information will improve Description: Including pain rating scale, medication(s)/side effects and non-pharmacologic comfort measures Outcome: Progressing   Problem: Health Behavior/Discharge Planning: Goal: Ability to manage health-related needs will improve Outcome: Progressing   Problem: Activity: Goal: Risk for activity intolerance will decrease Outcome: Progressing   

## 2018-08-25 NOTE — H&P (Signed)
Leslie Sims is an 67 y.o. male.   Chief Complaint: Shortness of breath HPI: The patient with past medical history of COPD, CAD, CHF, HTN, and depression presents to the emergency department with difficulty breathing. The patient states that he has been on antibiotics and prednisone for the last week but has not improved. He also reports having to use supplemental O2 during the day.  He usually only wears it at night. Blood gas showed CO2 of 83 and pH of 7.3 and the patient had increased work of breathing while reporting excessive somnolence which prompted the emergency department staff to initiate BiPAP before calling the hospitalist service for admission.    Past Medical History:  Diagnosis Date  . Allergic rhinitis   . Anxiety   . Back pain   . Colon polyps 08/2012   colonoscopy; multiple colon polyps; repeat colonoscopy in one year.  [name] COPD (chronic obstructive pulmonary disease) ([place])   . Coronary artery disease 11/2009   a. late presenting ant MI; b. LHC 100% pLAD s/p PCI/DES, 99% mRCA s/p PCI/DES, EF 35%; c. nuclear stress test 05/13: prior ant/inf infarcts w/o ischemia, EF 43%; d. LHC 02/14: widely patent stents with no other obs dz, EF 40%; e. LHC 11/17: LM nl, mLAD 10%, patent LAD stent, dLAD 20%, p-mRCA 10%, mRCA 40%, patent RCA stent, EF 35-45%   . Depression   . GERD (gastroesophageal reflux disease)   . Headache(784.0)   . Helicobacter pylori (H. pylori)   . Hemorrhoids   . Hernia    inguinal  . Hyperlipidemia   . Hypertension   . Insomnia   . Ischemic cardiomyopathy   . Myalgia   . Peptic ulcer   . ST elevation (STEMI) myocardial infarction involving left anterior descending coronary artery ([place]) 11/2009   a. s/p PCI to the LAD  . Status post dilation of esophageal narrowing 2000  . Tinea pedis   . Wears dentures    partial upper    Past Surgical History:  Procedure Laterality Date  . Admission  12/20/2012   COPD exacerbation.  [place].  [name] CARDIAC  CATHETERIZATION    . CARDIAC CATHETERIZATION  02/05/13   [facility]  . CARDIAC CATHETERIZATION  10/14   [facility] : patent stents with no change in anatomy. EF: 40$  . CARDIAC CATHETERIZATION Left 11/12/2016   Procedure: Left Heart Cath and Coronary Angiography;  Surgeon: Wellington Hampshire, MD;  Location: [facility];  Service: Cardiovascular;  Laterality: Left;  . COLONOSCOPY    . COLONOSCOPY WITH PROPOFOL N/A 05/18/2016   Procedure: COLONOSCOPY WITH PROPOFOL;  Surgeon: Lucilla Lame, MD;  Location: [facility] ENDOSCOPY;  Service: Endoscopy;  Laterality: N/A;  . CORONARY ANGIOPLASTY WITH STENT PLACEMENT  09/2009   LAD 3.0 X23 mm Xience DES, RCA: 4.0 X 15 mm Xience DES  . ELECTROMAGNETIC NAVIGATION BROCHOSCOPY N/A 10/27/2017   Procedure: ELECTROMAGNETIC NAVIGATION BRONCHOSCOPY;  Surgeon: Flora Lipps, MD;  Location: [facility] ORS;  Service: Cardiopulmonary;  Laterality: N/A;  . ESOPHAGEAL DILATION    . ESOPHAGOGASTRODUODENOSCOPY  2008  . ESOPHAGOGASTRODUODENOSCOPY  08/20/2012  . HERNIA REPAIR  07/20/2012   L inguinal hernia repair  . PENILE PROSTHESIS IMPLANT      Family History  Problem Relation Age of Onset  . Heart attack Brother        Brother #1  . Diabetes Brother   . Hypertension Brother        #3  . Coronary artery disease Father 24  deceased  . Heart attack Father   . Diabetes Father   . Heart disease Father   . COPD Mother 54       deceased  . Alcohol abuse Sister        polysubstance abuse  . COPD Sister   . Lung cancer Sister   . Alcohol abuse Sister        polysubstance abuse  . Penile cancer Brother   . Diabetes Brother    Social History:  reports that he has been smoking cigarettes. He has a 21.00 pack-year smoking history. He has never used smokeless tobacco. He reports that he does not drink alcohol or use drugs.  Allergies:  Allergies  Allergen Reactions  . Prozac [Fluoxetine Hcl] Shortness Of Breath  . Effexor Xr [Venlafaxine Hcl Er] Other (See Comments)    "Makes  me feel funny"  . Wellbutrin [Bupropion] Other (See Comments)    "Makes me feel funny"    Medications Prior to Admission  Medication Sig Dispense Refill  . albuterol (VENTOLIN HFA) 108 (90 Base) MCG/ACT inhaler USE 2 INHALATIONS EVERY 6 HOURS AS NEEDED FOR WHEEZING 48 g 3  . aspirin 81 MG tablet Take 81 mg by mouth daily.      [name] atorvastatin (LIPITOR) 40 MG tablet Take 1 tablet (40 mg total) by mouth daily. 90 tablet 3  . Fluticasone-Salmeterol (ADVAIR DISKUS) 500-50 MCG/DOSE AEPB Inhale 1 puff into the lungs 2 (two) times daily. 180 each 3  . furosemide (LASIX) 20 MG tablet Take 1 tablet (20 mg total) by mouth daily. 90 tablet 3  . hydrOXYzine (VISTARIL) 25 MG capsule Take 25 mg by mouth 2 (two) times daily as needed.  0  . ipratropium-albuterol (DUONEB) 0.5-2.5 (3) MG/3ML SOLN Take 3 mLs by nebulization every 4 (four) hours as needed. 1620 mL 3  . LORazepam (ATIVAN) 1 MG tablet Take 1 tablet (1 mg total) by mouth every 8 (eight) hours as needed for anxiety. 90 tablet 5  . losartan (COZAAR) 25 MG tablet TAKE 1 TABLET DAILY 90 tablet 3  . mirtazapine (REMERON) 30 MG tablet Take 1 tablet (30 mg total) by mouth at bedtime. 30 tablet 5  . Multiple Vitamin (MULTIVITAMIN) capsule Take 1 capsule by mouth daily.    . nitroGLYCERIN (NITROSTAT) 0.4 MG SL tablet Place 1 tablet (0.4 mg total) under the tongue every 5 (five) minutes as needed. 25 tablet 3  . sertraline (ZOLOFT) 100 MG tablet TAKE ONE TABLET BY MOUTH EVERY MORNING WITH BREAKFAST  2  . SPIRIVA HANDIHALER 18 MCG inhalation capsule PLACE 1 CAPSULE INTO INHALER AND INHALE THE CONTENTS DAILY 90 capsule 3  . sucralfate (CARAFATE) 1 GM/10ML suspension Take 10 mLs (1 g total) by mouth 4 (four) times daily -  with meals and at bedtime. (Patient taking differently: Take 1 g by mouth 4 (four) times daily as needed. ) 3600 mL 3    Results for orders placed or performed during the hospital encounter of 08/24/18 (from the past 48 hour(s))  Basic  metabolic panel     Status: Abnormal   Collection Time: 08/24/18  5:15 PM  Result Value Ref Range   Sodium 139 135 - 145 mmol/L   Potassium 4.2 3.5 - 5.1 mmol/L   Chloride 98 98 - 111 mmol/L   CO2 38 (H) 22 - 32 mmol/L   Glucose, Bld 102 (H) 70 - 99 mg/dL   BUN 12 8 - 23 mg/dL   Creatinine, Ser 0.83 0.61 -  1.24 mg/dL   Calcium 9.3 8.9 - 10.3 mg/dL   GFR calc non Af Amer >60 >60 mL/min   GFR calc Af Amer >60 >60 mL/min    Comment: (NOTE) The eGFR has been calculated using the CKD EPI equation. This calculation has not been validated in all clinical situations. eGFR's persistently <60 mL/min signify possible Chronic Kidney Disease.    Anion gap 3 (L) 5 - 15    Comment: Performed at [facility], [address] [place], [place] [address]  CBC     Status: None   Collection Time: 08/24/18  5:15 PM  Result Value Ref Range   WBC 8.1 3.8 - 10.6 K/uL   RBC 4.96 4.40 - 5.90 MIL/uL   Hemoglobin 15.3 13.0 - 18.0 g/dL   HCT 45.6 40.0 - 52.0 %   MCV 91.9 80.0 - 100.0 fL   MCH 30.9 26.0 - 34.0 pg   MCHC 33.6 32.0 - 36.0 g/dL   RDW 13.6 11.5 - 14.5 %   Platelets 272 150 - 440 K/uL    Comment: Performed at [facility], [address] [place], [place] [address]  Troponin I     Status: None   Collection Time: 08/24/18  5:34 PM  Result Value Ref Range   Troponin I <0.03 <0.03 ng/mL    Comment: Performed at [facility], [name] [place], [place] [address]  Blood gas, venous     Status: Abnormal (Preliminary result)   Collection Time: 08/24/18  7:36 PM  Result Value Ref Range   pH, Ven 7.30 7.250 - 7.430   pCO2, Ven 83 (HH) 44.0 - 60.0 mmHg    Comment: RBV SMITH, RN AT 2007 ON [id number]  CW, RRT    pO2, Ven PENDING 32.0 - 45.0 mmHg   Bicarbonate 40.8 (H) 20.0 - 28.0 mmol/L   Acid-Base Excess 10.2 (H) 0.0 - 2.0 mmol/L   O2 Saturation PENDING %   Patient temperature 37.0    Collection site VEIN    Sample type VENOUS     Comment: Performed at  [facility], [address], [place],  [address]   Dg Chest 2 View  Result Date: 08/24/2018 CLINICAL DATA:  Short of breath EXAM: CHEST - 2 VIEW COMPARISON:  07/02/2018, 05/22/2018, CT chest 06/01/2017 FINDINGS: Hyperinflation with mild bronchitic changes. Emphysematous disease. No acute consolidation or pleural effusion. Cardiomediastinal silhouette within normal limits. Atelectasis or scar in the right upper lobe. 12 mm left upper lobe lung nodule, no change. IMPRESSION: 1. Hyperinflation with emphysematous disease and bronchitic changes. 2. Stable left upper lobe lung nodule Electronically Signed   By: Donavan Foil M.D.   On: 08/24/2018 17:56    Review of Systems  Constitutional: Negative for chills and fever.  HENT: Negative for sore throat and tinnitus.   Eyes: Negative for blurred vision and redness.  Respiratory: Positive for cough, shortness of breath and wheezing.   Cardiovascular: Negative for chest pain, palpitations, orthopnea and PND.  Gastrointestinal: Negative for abdominal pain, diarrhea, nausea and vomiting.  Genitourinary: Negative for dysuria, frequency and urgency.  Musculoskeletal: Negative for joint pain and myalgias.  Skin: Negative for rash.       No lesions  Neurological: Negative for speech change, focal weakness and weakness.  Endo/Heme/Allergies: Does not bruise/bleed easily.       No temperature intolerance  Psychiatric/Behavioral: Negative for depression and suicidal ideas.    Blood pressure (!) 149/98, pulse 86, temperature 98.6 F (37  C), temperature source Oral, resp. rate 15, height 5' 10"  (1.778 m), weight 63.8 kg, SpO2 94 %. Physical Exam  Vitals reviewed. Constitutional: He is oriented to person, place, and time. He appears well-developed and well-nourished. No distress.  HENT:  Head: Normocephalic and atraumatic.  Mouth/Throat: Oropharynx is clear and moist.  Eyes: Pupils are equal, round, and reactive to light. Conjunctivae and  EOM are normal. No scleral icterus.  Neck: Normal range of motion. Neck supple. No JVD present. No tracheal deviation present. No thyromegaly present.  Cardiovascular: Normal rate, regular rhythm and normal heart sounds. Exam reveals no friction rub.  No murmur heard. Respiratory: Effort normal and breath sounds normal. No respiratory distress.  GI: Soft. Bowel sounds are normal. He exhibits no distension. There is no tenderness.  Genitourinary:  Genitourinary Comments: Deferred  Musculoskeletal: Normal range of motion. He exhibits no edema.  Lymphadenopathy:    He has no cervical adenopathy.  Neurological: He is alert and oriented to person, place, and time. No cranial nerve deficit.  Skin: Skin is warm and dry. No rash noted. No erythema.  Psychiatric: He has a normal mood and affect. His behavior is normal. Judgment and thought content normal.     Assessment/Plan This is a 67 year old male admitted for respiratory failure. 1.  Respiratory failure: Acute on chronic; with hypoxia and hypercapnia.  The patient is weaned from BiPAP.  He is more comfortable now.  Continue supplemental oxygen at home levels. 2.  COPD: With exacerbation; recommend long steroid taper.  Continue inhaled corticosteroid as well as Spiriva.  Schedule albuterol every 4 hours. 3.  CAD: Stable; continue aspirin 4.  Hypertension: Uncontrolled; continue losartan 5.  CHF: Systolic; chronic.  Continue Lasix per home regimen 6.  Hyperlipidemia: Continue statin therapy 7.  DVT prophylaxis: Lovenox 8.  GI prophylaxis: None The patient is a full code.  Time spent on admission orders and patient care approximately 45 minutes  Harrie Foreman, MD 08/25/2018, 1:59 AM

## 2018-08-25 NOTE — Plan of Care (Signed)

## 2018-08-26 LAB — CBC
HCT: 42.6 % (ref 40.0–52.0)
HEMOGLOBIN: 14.2 g/dL (ref 13.0–18.0)
MCH: 30.5 pg (ref 26.0–34.0)
MCHC: 33.3 g/dL (ref 32.0–36.0)
MCV: 91.8 fL (ref 80.0–100.0)
PLATELETS: 232 10*3/uL (ref 150–440)
RBC: 4.65 MIL/uL (ref 4.40–5.90)
RDW: 13.5 % (ref 11.5–14.5)
WBC: 11.5 10*3/uL — ABNORMAL HIGH (ref 3.8–10.6)

## 2018-08-26 LAB — BASIC METABOLIC PANEL
Anion gap: 8 (ref 5–15)
BUN: 14 mg/dL (ref 8–23)
CHLORIDE: 99 mmol/L (ref 98–111)
CO2: 35 mmol/L — ABNORMAL HIGH (ref 22–32)
CREATININE: 0.88 mg/dL (ref 0.61–1.24)
Calcium: 9 mg/dL (ref 8.9–10.3)
GFR calc non Af Amer: >60 mL/min (ref >60)
Glucose, Bld: 98 mg/dL (ref 70–99)
Potassium: 3.8 mmol/L (ref 3.5–5.1)
SODIUM: 142 mmol/L (ref 135–145)

## 2018-08-26 MED ORDER — PREDNISONE 10 MG (21) PO TBPK: 10.0000 mg | ORAL_TABLET | Freq: Every day | ORAL | 0 refills | Status: DC

## 2018-08-26 MED ORDER — ALBUTEROL SULFATE (2.5 MG/3ML) 0.083% IN NEBU: 2.5000 mg | INHALATION_SOLUTION | Freq: Four times a day (QID) | RESPIRATORY_TRACT | Status: DC

## 2018-08-26 MED ORDER — ACETAMINOPHEN 325 MG PO TABS: 650.0000 mg | ORAL_TABLET | Freq: Four times a day (QID) | ORAL | Status: DC | PRN

## 2018-08-26 NOTE — Discharge Instructions (Signed)
Follow-up with the primary care physician in 3 days Follow-up with pulmonology Dr. Jamal Collin in 2 to 4 weeks Continue home oxygen 2 L Follow-up with Dr. Jake Michaelis patient's primary psychiatrist in a week

## 2018-08-26 NOTE — Discharge Summary (Signed)
[facility] [place] NAME: Leslie Sims    MR#:  [id number]  DATE OF BIRTH:  [birth year]  DATE OF ADMISSION:  08/24/2018 ADMITTING PHYSICIAN: Harrie Foreman, MD  DATE OF DISCHARGE: 08/26/18  PRIMARY CARE PHYSICIAN: Patient, No Pcp Per    ADMISSION DIAGNOSIS:  Acute on chronic respiratory failure with hypoxia and hypercapnia (HCC) [J96.21, J96.22]  DISCHARGE DIAGNOSIS:  Active Problems:   Acute on chronic respiratory failure with hypoxia and hypercapnia (HCC)  Pulmonary nodule chronic SECONDARY DIAGNOSIS:   Past Medical History:  Diagnosis Date  . Allergic rhinitis   . Anxiety   . Back pain   . Colon polyps 08/2012   colonoscopy; multiple colon polyps; repeat colonoscopy in one year.  [name] COPD (chronic obstructive pulmonary disease) ([place])   . Coronary artery disease 11/2009   a. late presenting ant MI; b. LHC 100% pLAD s/p PCI/DES, 99% mRCA s/p PCI/DES, EF 35%; c. nuclear stress test 05/13: prior ant/inf infarcts w/o ischemia, EF 43%; d. LHC 02/14: widely patent stents with no other obs dz, EF 40%; e. LHC 11/17: LM nl, mLAD 10%, patent LAD stent, dLAD 20%, p-mRCA 10%, mRCA 40%, patent RCA stent, EF 35-45%   . Depression   . GERD (gastroesophageal reflux disease)   . Headache(784.0)   . Helicobacter pylori (H. pylori)   . Hemorrhoids   . Hernia    inguinal  . Hyperlipidemia   . Hypertension   . Insomnia   . Ischemic cardiomyopathy   . Myalgia   . Peptic ulcer   . ST elevation (STEMI) myocardial infarction involving left anterior descending coronary artery ([place]) 11/2009   a. s/p PCI to the LAD  . Status post dilation of esophageal narrowing 2000  . Tinea pedis   . Wears dentures    partial upper    HOSPITAL COURSE:  HPI: The patient with past medical history of COPD, CAD, CHF, HTN, and depression presents to the emergency department with difficulty breathing. The patient states that he has been on antibiotics and  prednisone for the last week but has not improved. He also reports having to use supplemental O2 during the day.  He usually only wears it at night. Blood gas showed CO2 of 83 and pH of 7.3 and the patient had increased work of breathing while reporting excessive somnolence which prompted the emergency department staff to initiate BiPAP before calling the hospitalist service for admission.    Acute on chronic hypoxic hypercapnic respiratory failure- secondary to COPD exacerbation. Initially on BiPAP, but weaned to [facility]. On 2L O2 today (uses 2L O2 intermittently at home), but becomes very short of breath with ambulation. - continue supplemental O2 - continue spiriva and breo ellipta - continue albuterol nebs - continue prednisone tapering dose -Outpatient follow-up with pulmonology Dr. Jamal Collin  Depression/anxiety- very tearful this morning due to worsening depression. Follows with outpatient psych, who are adjusting meds. No SI. - declines inpatient psych consult - would like to f/u with his psychiatrist Dr. Jake Michaelis on discharge because they have a good relationship - continue home hydroxyzine prn, ativan prn, remeron, zoloft  CAD- stable, no active chest pain - continue aspirin  Hypertension- normotensive today - continue losartan  Chronic systolic CHF- stable, no signs of volume overload - continue home lasix 20mg  daily   Hyperlipidemia- stable - continue home lipitor   Pulmonary nodule seems to be chronic patient reports the pulmonologist Dr. Jamal Collin is following.  Please follow-up with  Dr. Jamal Collin as recommended in 2 to 4 weeks  DISCHARGE CONDITIONS:   stable  CONSULTS OBTAINED:     PROCEDURES  none  DRUG ALLERGIES:   Allergies  Allergen Reactions  . Prozac [Fluoxetine Hcl] Shortness Of Breath  . Effexor Xr [Venlafaxine Hcl Er] Other (See Comments)    "Makes me feel funny"  . Wellbutrin [Bupropion] Other (See Comments)    "Makes me feel funny"    DISCHARGE  MEDICATIONS:   Allergies as of 08/26/2018      Reactions   Prozac [fluoxetine Hcl] Shortness Of Breath   Effexor Xr [venlafaxine Hcl Er] Other (See Comments)   "Makes me feel funny"   Wellbutrin [bupropion] Other (See Comments)   "Makes me feel funny"      Medication List    TAKE these medications   acetaminophen 325 MG tablet Commonly known as:  TYLENOL Take 2 tablets (650 mg total) by mouth every 6 (six) hours as needed for mild pain (or Fever >/= 101).   albuterol 108 (90 Base) MCG/ACT inhaler Commonly known as:  PROVENTIL HFA;VENTOLIN HFA USE 2 INHALATIONS EVERY 6 HOURS AS NEEDED FOR WHEEZING   ARIPiprazole 5 MG tablet Commonly known as:  ABILIFY Take 5 mg by mouth daily.   aspirin 81 MG tablet Take 81 mg by mouth daily.   atorvastatin 40 MG tablet Commonly known as:  LIPITOR Take 1 tablet (40 mg total) by mouth daily.   Fluticasone-Salmeterol 500-50 MCG/DOSE Aepb Commonly known as:  ADVAIR Inhale 1 puff into the lungs 2 (two) times daily.   furosemide 20 MG tablet Commonly known as:  LASIX Take 1 tablet (20 mg total) by mouth daily.   hydrOXYzine 25 MG capsule Commonly known as:  VISTARIL Take 25 mg by mouth 2 (two) times daily as needed.   ipratropium-albuterol 0.5-2.5 (3) MG/3ML Soln Commonly known as:  DUONEB Take 3 mLs by nebulization every 4 (four) hours as needed.   LORazepam 1 MG tablet Commonly known as:  ATIVAN Take 1 tablet (1 mg total) by mouth every 8 (eight) hours as needed for anxiety.   losartan 25 MG tablet Commonly known as:  COZAAR TAKE 1 TABLET DAILY   mirtazapine 30 MG tablet Commonly known as:  REMERON Take 1 tablet (30 mg total) by mouth at bedtime.   multivitamin capsule Take 1 capsule by mouth daily.   nitroGLYCERIN 0.4 MG SL tablet Commonly known as:  NITROSTAT Place 1 tablet (0.4 mg total) under the tongue every 5 (five) minutes as needed.   predniSONE 10 MG (21) Tbpk tablet Commonly known as:  STERAPRED UNI-PAK 21  TAB Take 1 tablet (10 mg total) by mouth daily. Take 6 tablets by mouth for 1 day followed by  5 tablets by mouth for 1 day followed by  4 tablets by mouth for 1 day followed by  3 tablets by mouth for 1 day followed by  2 tablets by mouth for 1 day followed by  1 tablet by mouth for a day and stop   sertraline 100 MG tablet Commonly known as:  ZOLOFT Take 100 mg by mouth daily.   SPIRIVA HANDIHALER 18 MCG inhalation capsule Generic drug:  tiotropium PLACE 1 CAPSULE INTO INHALER AND INHALE THE CONTENTS DAILY   sucralfate 1 GM/10ML suspension Commonly known as:  CARAFATE Take 10 mLs (1 g total) by mouth 4 (four) times daily -  with meals and at bedtime. What changed:    when to take this  reasons to  take this        DISCHARGE INSTRUCTIONS:   Follow-up with the primary care physician in 3 days Follow-up with pulmonology Dr. Jamal Collin in 2 to 4 weeks Continue home oxygen 2 L  Follow-up with psychiatrist Dr. Jake Michaelis in 1 week  DIET:  Cardiac diet  DISCHARGE CONDITION:  Fair  ACTIVITY:  Activity as tolerated  OXYGEN:  Home Oxygen: Yes.     Oxygen Delivery: 2 liters/min via Patient connected to nasal cannula oxygen  DISCHARGE LOCATION:  home   If you experience worsening of your admission symptoms, develop shortness of breath, life threatening emergency, suicidal or homicidal thoughts you must seek medical attention immediately by calling 911 or calling your MD immediately  if symptoms less severe.  You Must read complete instructions/literature along with all the possible adverse reactions/side effects for all the Medicines you take and that have been prescribed to you. Take any new Medicines after you have completely understood and accpet all the possible adverse reactions/side effects.   Please note  You were cared for by a hospitalist during your hospital stay. If you have any questions about your discharge medications or the care you received while you were in  the hospital after you are discharged, you can call the unit and asked to speak with the hospitalist on call if the hospitalist that took care of you is not available. Once you are discharged, your primary care physician will handle any further medical issues. Please note that NO REFILLS for any discharge medications will be authorized once you are discharged, as it is imperative that you return to your primary care physician (or establish a relationship with a primary care physician if you do not have one) for your aftercare needs so that they can reassess your need for medications and monitor your lab values.     Today  Chief Complaint  Patient presents with  . Shortness of Breath   Patient shortness of breath significantly improved.  On back to 2 L of oxygen via nasal cannula which is his chronic home oxygen.  Ambulating fine.  Feels comfortable to be discharged home.  Sees Dr. Jamal Collin as an outpatient for his lungs and Dr. Jake Michaelis for his depression and anxiety  ROS:  CONSTITUTIONAL: Denies fevers, chills. Denies any fatigue, weakness.  EYES: Denies blurry vision, double vision, eye pain. EARS, NOSE, THROAT: Denies tinnitus, ear pain, hearing loss. RESPIRATORY: Denies cough, wheeze, shortness of breath.  CARDIOVASCULAR: Denies chest pain, palpitations, edema.  GASTROINTESTINAL: Denies nausea, vomiting, diarrhea, abdominal pain. Denies bright red blood per rectum. GENITOURINARY: Denies dysuria, hematuria. ENDOCRINE: Denies nocturia or thyroid problems. HEMATOLOGIC AND LYMPHATIC: Denies easy bruising or bleeding. SKIN: Denies rash or lesion. MUSCULOSKELETAL: Denies pain in neck, back, shoulder, knees, hips or arthritic symptoms.  NEUROLOGIC: Denies paralysis, paresthesias.  PSYCHIATRIC: Denies anxiety or depressive symptoms.   VITAL SIGNS:  Blood pressure 109/65, pulse 72, temperature 98.3 F (36.8 C), temperature source Oral, resp. rate 16, height 5\' 10"  (1.778 m), weight 63.5 kg, SpO2  100 %.  I/O:    Intake/Output Summary (Last 24 hours) at 08/26/2018 1246 Last data filed at 08/25/2018 1430 Gross per 24 hour  Intake 240 ml  Output -  Net 240 ml    PHYSICAL EXAMINATION:  GENERAL:  67 y.o.-year-old patient lying in the bed with no acute distress.  EYES: Pupils equal, round, reactive to light and accommodation. No scleral icterus. Extraocular muscles intact.  HEENT: Head atraumatic, normocephalic. Oropharynx and nasopharynx clear.  NECK:  Supple, no jugular venous distention. No thyroid enlargement, no tenderness.  LUNGS: Normal breath sounds bilaterally, no wheezing, rales,rhonchi or crepitation. No use of accessory muscles of respiration.  CARDIOVASCULAR: S1, S2 normal. No murmurs, rubs, or gallops.  ABDOMEN: Soft, non-tender, non-distended. Bowel sounds present. No organomegaly or mass.  EXTREMITIES: No pedal edema, cyanosis, or clubbing.  NEUROLOGIC: Cranial nerves II through XII are intact. Muscle strength 5/5 in all extremities. Sensation intact. Gait not checked.  PSYCHIATRIC: The patient is alert and oriented x 3.  SKIN: No obvious rash, lesion, or ulcer.   DATA REVIEW:   CBC Recent Labs  Lab 08/26/18 0459  WBC 11.5*  HGB 14.2  HCT 42.6  PLT 232    Chemistries  Recent Labs  Lab 08/26/18 0459  NA 142  K 3.8  CL 99  CO2 35*  GLUCOSE 98  BUN 14  CREATININE 0.88  CALCIUM 9.0    Cardiac Enzymes Recent Labs  Lab 08/24/18 1734  [place] <0.03    Microbiology Results  Results for orders placed or performed during the hospital encounter of 12/25/17  Blood Culture (routine x 2)     Status: None   Collection Time: 12/25/17  8:44 PM  Result Value Ref Range Status   Specimen Description BLOOD RIGHT ANTECUBITAL  Final   Special Requests   Final    BOTTLES DRAWN AEROBIC AND ANAEROBIC Blood Culture adequate volume   Culture   Final    NO GROWTH 5 DAYS Performed at [facility], [address] [place], [place] [address]    Report  Status 12/30/2017 FINAL  Final  Blood Culture (routine x 2)     Status: None   Collection Time: 12/25/17  8:52 PM  Result Value Ref Range Status   Specimen Description BLOOD LEFT WRIST  Final   Special Requests   Final    BOTTLES DRAWN AEROBIC AND ANAEROBIC Blood Culture results may not be optimal due to an excessive volume of blood received in culture bottles   Culture   Final    NO GROWTH 5 DAYS Performed at [facility], [address] [place], [place] [address]    Report Status 12/30/2017 FINAL  Final  MRSA PCR Screening     Status: None   Collection Time: 12/26/17  1:26 AM  Result Value Ref Range Status   MRSA by PCR NEGATIVE NEGATIVE Final    Comment:        The GeneXpert MRSA Assay (FDA approved for NASAL specimens only), is one component of a comprehensive MRSA colonization surveillance program. It is not intended to diagnose MRSA infection nor to guide or monitor treatment for MRSA infections. Performed at [facility], [address] [place], [place] [address]     RADIOLOGY:  Dg Chest 2 View  Result Date: 08/24/2018 CLINICAL DATA:  Short of breath EXAM: CHEST - 2 VIEW COMPARISON:  07/02/2018, 05/22/2018, CT chest 06/01/2017 FINDINGS: Hyperinflation with mild bronchitic changes. Emphysematous disease. No acute consolidation or pleural effusion. Cardiomediastinal silhouette within normal limits. Atelectasis or scar in the right upper lobe. 12 mm left upper lobe lung nodule, no change. IMPRESSION: 1. Hyperinflation with emphysematous disease and bronchitic changes. 2. Stable left upper lobe lung nodule Electronically Signed   By: Donavan Foil M.D.   On: 08/24/2018 17:56    EKG:   Orders placed or performed during the hospital encounter of 08/24/18  . EKG 12-Lead  . EKG 12-Lead  . ED EKG  . ED  EKG      Management plans discussed with the patient, family and they are in agreement.  CODE STATUS:     Code Status Orders  (From admission,  onward)         Start     Ordered   08/25/18 0140  Full code  Continuous     08/25/18 0140        Code Status History    Date Active Date Inactive Code Status Order ID Comments User Context   07/02/2018 1903 07/03/2018 2216 Full Code [id number]  Hillary Bow, MD ED   12/26/2017 0129 12/30/2017 2106 Full Code [id number]  Amelia Jo, MD Inpatient      TOTAL TIME TAKING CARE OF THIS PATIENT: 45  minutes.   Note: This dictation was prepared with Dragon dictation along with smaller phrase technology. Any transcriptional errors that result from this process are unintentional.   @[facility] @  on 08/26/2018 at 12:46 PM  Between 7am to 6pm - Pager - [phone]  After 6pm go to [email or web address] - password [email or web address] [facility]  Office  [phone]  CC: Primary care physician; Patient, No Pcp Per

## 2018-08-29 ENCOUNTER — Ambulatory Visit: Payer: BLUE CROSS/BLUE SHIELD | Admitting: Psychology

## 2018-08-29 DIAGNOSIS — F411 Generalized anxiety disorder: Secondary | ICD-10-CM

## 2018-08-31 ENCOUNTER — Other Ambulatory Visit: Payer: Self-pay | Admitting: Family Medicine

## 2018-09-05 ENCOUNTER — Encounter: Payer: Self-pay | Admitting: Pulmonary Disease

## 2018-09-05 ENCOUNTER — Ambulatory Visit (INDEPENDENT_AMBULATORY_CARE_PROVIDER_SITE_OTHER): Payer: BLUE CROSS/BLUE SHIELD | Admitting: Pulmonary Disease

## 2018-09-05 VITALS — BP 114/70 | HR 108 | Ht 70.0 in | Wt 145.0 lb

## 2018-09-05 DIAGNOSIS — J432 Centrilobular emphysema: Secondary | ICD-10-CM

## 2018-09-05 DIAGNOSIS — J449 Chronic obstructive pulmonary disease, unspecified: Secondary | ICD-10-CM | POA: Diagnosis not present

## 2018-09-05 DIAGNOSIS — F17200 Nicotine dependence, unspecified, uncomplicated: Secondary | ICD-10-CM

## 2018-09-05 NOTE — Patient Instructions (Signed)
Continue Advair and Spiriva inhalers as maintenance medications Continue albuterol (either inhaler or nebulizer) as rescue medicine.  This may be used up to 4 or 5 times per day Continue oxygen therapy with exertion and sleep Smoking cessation as we discussed Follow-up in 3 months.  Call sooner if needed

## 2018-09-05 NOTE — Progress Notes (Signed)
PULMONARY OFFICE FOLLOW UP NOTE  Requesting MD/Service: [name], MD Date of initial consultation: 08/10/16 Reason for consultation: COPD, smoker  PT PROFILE: 57 M smoker with severe emphysema seen by multiple pulmonologists in past ([place], [place], [place], [place]) referred for further eval and mgmt of COPD   DATA: 07/24/12 Office Spirometry: Severe obstruction (FEV1 31% pred) 08/31/13 Office Spirometry: very severe obstruction (FEV1 25% pred) 06/18/15 CT chest: Severe emphysema 03/03/17 Overnight oximetry: Lowest SpO2 72%. 95% of time SpO2 was below 90% 03/03/17 6MWT: 384 meters. Desat to 84% 10/04/17 CT chest: Two new irregularly marginated nodules, one in the right upper lobe, and a second in the mid left upper lobe worrisome for either synchronous lung carcinoma or metastatic involvement of the lungs. The two small nodules described on the prior dictated report have resolved in the left superior segment and most likely were postinflammatory. Diffuse centrilobular emphysema. 10/13/17 PET scan: The new left upper lobe nodule is FDG avid consistent with malignancy. Recommend tissue confirmation. The new nodule in the right upper lobe demonstrates low level uptake. While the level of uptake suggests the possibility of an inflammatory or infectious process, a low-grade malignancy is not excluded. Given the suspected malignancy on the left, tissue confirmation should be considered on the right despite the low level of uptake. No distant metastatic disease identified 10/27/17 ENB ([place]): Transbronchial Fine Needle Aspirations 21G X7, Transbronchial Forceps Biopsy X6. ALVEOLATED LUNG TISSUE WITH HEMOSIDERIN LADEN MACROPHAGES.  NEGATIVE FOR ATYPIA AND MALIGNANCY 12/07/17 CTA chest: No pulmonary embolus identified. 2. Anterior left upper lobe ground-glass and nodular consolidation likely representing pneumonitis. 3. Stable bilateral upper lobe pulmonary nodules.4. Stable emphysema. 01/21/18 CT  chest: There is new masslike architectural distortion and ground-glass attenuation surrounding the index nodule within the right upper lobe. The appearance favors an inflammatory or infectious process. Underlying tumor is considered less favored but not excluded and follow-up imaging with a repeat CT of the chest following appropriate antibiotic therapy is recommended in 3-4 weeks following trial of antibiotic therapy to ensure resolution and exclude underlying malignancy. The index nodule in the left upper lobe is slightly decreased in volume when compared with the previous exam favoring a benign process. Attention on follow-up imaging advise. Advanced changes of emphysema (very severe) 06/01/18 CTA chest: No evidence of pulmonary embolism. Waxing and waning bilateral pulmonary nodular process as described. New 8 mm nodular ill-defined focus of opacification over the lingula which may represent an acute inflammatory or infectious process. No effusion. Emphysema   INTERVAL: Hospitalized 09/05-09/07/19 for COPD exacerbation   SUBJ: This is a post hospital follow-up.  He was hospitalized for COPD exacerbation as noted above.  He was not seen by pulmonary medicine during that hospitalization.  Since discharge, he has completed a course of prednisone and antibiotics.  He continues to smoke approximately 10 cigarettes/day.  He remains on Advair and Spiriva as his maintenance medications.  He is using albuterol up to 4 times per day.  He remains severely limited by exertional dyspnea.  He continues to have chronic cough with "phlegm".  He denies hemoptysis and pleuritic chest pain.  He is wearing oxygen with sleep and exertion.  He denies CP, fever, purulent sputum, hemoptysis, LE edema and calf tenderness.  OBJ: Vitals:   09/05/18 1339 09/05/18 1342  BP:  114/70  Pulse:  (!) 108  SpO2:  94%  Weight: 145 lb (65.8 kg)   Height: 5\' 10"  (1.778 m)   2 LPM   EXAM:  Gen: No respiratory  distress.  He appears  much older than his true age 67: NCAT, sclera white, oropharynx normal Neck: No JVD or lymphadenopathy Lungs: BS markedly diminished without wheezes or other adventitious sounds Cardiovascular: RRR, no M Abdomen: Soft, nontender, normal BS Ext: Digits are tobacco stained.  No C/C/E Neuro: No focal deficits on limited exam  DATA:   BMP Latest Ref Rng & Units 08/26/2018 08/24/2018 07/02/2018  Glucose 70 - 99 mg/dL 98 102(H) 103(H)  BUN 8 - 23 mg/dL 14 12 12   Creatinine 0.61 - 1.24 mg/dL 0.88 0.83 0.84  BUN/Creat Ratio 10 - 24 - - -  Sodium 135 - 145 mmol/L 142 139 140  Potassium 3.5 - 5.1 mmol/L 3.8 4.2 3.9  Chloride 98 - 111 mmol/L 99 98 99  CO2 22 - 32 mmol/L 35(H) 38(H) 33(H)  Calcium 8.9 - 10.3 mg/dL 9.0 9.3 9.2    CBC Latest Ref Rng & Units 08/26/2018 08/24/2018 07/02/2018  WBC 3.8 - 10.6 K/uL 11.5(H) 8.1 8.9  Hemoglobin 13.0 - 18.0 g/dL 14.2 15.3 13.9  Hematocrit 40.0 - 52.0 % 42.6 45.6 40.7  Platelets 150 - 440 K/uL 232 272 350   CXR 09/05: Severe emphysema.  Platelike atelectasis in RUL.  Vague, stable LUL nodule  IMPRESSION:   Smoker  Stage 4 very severe COPD by GOLD classification (HCC)  Centrilobular emphysema (HCC)   PLAN:  Continue Advair and Spiriva inhalers as maintenance medications Continue albuterol (either inhaler or nebulizer) as rescue medicine.  This may be used up to 4 or 5 times per day Continue oxygen therapy with exertion and sleep Smoking cessation as we discussed Follow-up in 3 months.  Call sooner if needed  Merton Border, MD PCCM service Mobile [phone] Pager [phone] 09/05/2018  2:40 PM

## 2018-09-07 ENCOUNTER — Ambulatory Visit: Payer: BLUE CROSS/BLUE SHIELD | Admitting: Psychology

## 2018-09-08 ENCOUNTER — Telehealth: Payer: Self-pay | Admitting: Pulmonary Disease

## 2018-09-08 LAB — BLOOD GAS, VENOUS
Acid-Base Excess: 10.2 mmol/L — ABNORMAL HIGH (ref 0.0–2.0)
Bicarbonate: 40.8 mmol/L — ABNORMAL HIGH (ref 20.0–28.0)
PATIENT TEMPERATURE: 37
PH VEN: 7.3 (ref 7.250–7.430)
pCO2, Ven: 83 mmHg (ref 44.0–60.0)

## 2018-09-08 NOTE — Telephone Encounter (Signed)
Patient calling stating he was seen last week  And is calling to see If we can call in an antibiotic He states he was outside and now is having issues with his breathing  Would like a call back on this

## 2018-09-11 NOTE — Telephone Encounter (Signed)
Continue Advair and Spiriva, use duoneb as often as every 4 hours.

## 2018-09-13 ENCOUNTER — Other Ambulatory Visit: Payer: Self-pay

## 2018-09-13 ENCOUNTER — Inpatient Hospital Stay
Admission: EM | Admit: 2018-09-13 | Discharge: 2018-09-15 | DRG: 190 | Disposition: A | Discharge status: Home / Self Care | Payer: BLUE CROSS/BLUE SHIELD | Attending: Internal Medicine | Admitting: Internal Medicine

## 2018-09-13 ENCOUNTER — Emergency Department: Payer: BLUE CROSS/BLUE SHIELD

## 2018-09-13 ENCOUNTER — Encounter: Payer: Self-pay | Admitting: Emergency Medicine

## 2018-09-13 DIAGNOSIS — Z79899 Other long term (current) drug therapy: Secondary | ICD-10-CM

## 2018-09-13 DIAGNOSIS — F418 Other specified anxiety disorders: Secondary | ICD-10-CM | POA: Diagnosis present

## 2018-09-13 DIAGNOSIS — I10 Essential (primary) hypertension: Secondary | ICD-10-CM | POA: Diagnosis present

## 2018-09-13 DIAGNOSIS — F329 Major depressive disorder, single episode, unspecified: Secondary | ICD-10-CM | POA: Diagnosis present

## 2018-09-13 DIAGNOSIS — Z825 Family history of asthma and other chronic lower respiratory diseases: Secondary | ICD-10-CM

## 2018-09-13 DIAGNOSIS — I252 Old myocardial infarction: Secondary | ICD-10-CM | POA: Diagnosis not present

## 2018-09-13 DIAGNOSIS — Z888 Allergy status to other drugs, medicaments and biological substances status: Secondary | ICD-10-CM

## 2018-09-13 DIAGNOSIS — I255 Ischemic cardiomyopathy: Secondary | ICD-10-CM | POA: Diagnosis present

## 2018-09-13 DIAGNOSIS — R000 Tachycardia, unspecified: Secondary | ICD-10-CM | POA: Diagnosis present

## 2018-09-13 DIAGNOSIS — J441 Chronic obstructive pulmonary disease with (acute) exacerbation: Secondary | ICD-10-CM | POA: Diagnosis not present

## 2018-09-13 DIAGNOSIS — J9622 Acute and chronic respiratory failure with hypercapnia: Secondary | ICD-10-CM | POA: Diagnosis present

## 2018-09-13 DIAGNOSIS — Z955 Presence of coronary angioplasty implant and graft: Secondary | ICD-10-CM

## 2018-09-13 DIAGNOSIS — F17210 Nicotine dependence, cigarettes, uncomplicated: Secondary | ICD-10-CM | POA: Diagnosis present

## 2018-09-13 DIAGNOSIS — Z960 Presence of urogenital implants: Secondary | ICD-10-CM | POA: Diagnosis present

## 2018-09-13 DIAGNOSIS — K219 Gastro-esophageal reflux disease without esophagitis: Secondary | ICD-10-CM | POA: Diagnosis present

## 2018-09-13 DIAGNOSIS — Z801 Family history of malignant neoplasm of trachea, bronchus and lung: Secondary | ICD-10-CM

## 2018-09-13 DIAGNOSIS — J9621 Acute and chronic respiratory failure with hypoxia: Secondary | ICD-10-CM | POA: Diagnosis present

## 2018-09-13 DIAGNOSIS — Z833 Family history of diabetes mellitus: Secondary | ICD-10-CM

## 2018-09-13 DIAGNOSIS — Z7982 Long term (current) use of aspirin: Secondary | ICD-10-CM

## 2018-09-13 DIAGNOSIS — Z7951 Long term (current) use of inhaled steroids: Secondary | ICD-10-CM

## 2018-09-13 DIAGNOSIS — E785 Hyperlipidemia, unspecified: Secondary | ICD-10-CM | POA: Diagnosis present

## 2018-09-13 DIAGNOSIS — Z86010 Personal history of colonic polyps: Secondary | ICD-10-CM | POA: Diagnosis not present

## 2018-09-13 DIAGNOSIS — Z8249 Family history of ischemic heart disease and other diseases of the circulatory system: Secondary | ICD-10-CM | POA: Diagnosis not present

## 2018-09-13 DIAGNOSIS — Z8711 Personal history of peptic ulcer disease: Secondary | ICD-10-CM

## 2018-09-13 DIAGNOSIS — Z9981 Dependence on supplemental oxygen: Secondary | ICD-10-CM | POA: Diagnosis not present

## 2018-09-13 DIAGNOSIS — I2510 Atherosclerotic heart disease of native coronary artery without angina pectoris: Secondary | ICD-10-CM | POA: Diagnosis present

## 2018-09-13 DIAGNOSIS — Z8049 Family history of malignant neoplasm of other genital organs: Secondary | ICD-10-CM

## 2018-09-13 DIAGNOSIS — Z811 Family history of alcohol abuse and dependence: Secondary | ICD-10-CM

## 2018-09-13 DIAGNOSIS — J9601 Acute respiratory failure with hypoxia: Secondary | ICD-10-CM | POA: Diagnosis present

## 2018-09-13 DIAGNOSIS — R0602 Shortness of breath: Secondary | ICD-10-CM | POA: Diagnosis not present

## 2018-09-13 DIAGNOSIS — J9602 Acute respiratory failure with hypercapnia: Secondary | ICD-10-CM

## 2018-09-13 HISTORY — DX: Other nonspecific abnormal finding of lung field: R91.8

## 2018-09-13 LAB — BASIC METABOLIC PANEL
ANION GAP: 11 (ref 5–15)
BUN: 13 mg/dL (ref 8–23)
CHLORIDE: 96 mmol/L — AB (ref 98–111)
CO2: 32 mmol/L (ref 22–32)
Calcium: 9.4 mg/dL (ref 8.9–10.3)
Creatinine, Ser: 0.85 mg/dL (ref 0.61–1.24)
GFR calc Af Amer: >60 mL/min (ref >60)
GFR calc non Af Amer: >60 mL/min (ref >60)
Glucose, Bld: 178 mg/dL — ABNORMAL HIGH (ref 70–99)
POTASSIUM: 4 mmol/L (ref 3.5–5.1)
Sodium: 139 mmol/L (ref 135–145)

## 2018-09-13 LAB — CBC
HEMATOCRIT: 42.6 % (ref 40.0–52.0)
HEMOGLOBIN: 14.4 g/dL (ref 13.0–18.0)
MCH: 30.9 pg (ref 26.0–34.0)
MCHC: 33.8 g/dL (ref 32.0–36.0)
MCV: 91.4 fL (ref 80.0–100.0)
Platelets: 269 10*3/uL (ref 150–440)
RBC: 4.66 MIL/uL (ref 4.40–5.90)
RDW: 13.5 % (ref 11.5–14.5)
WBC: 7.6 10*3/uL (ref 3.8–10.6)

## 2018-09-13 IMAGING — CT CT ANGIO CHEST
2 of 6 series · 16 of 46 positions shown · IV contrast (APPLIED)
Comparison: CT/CTA chest [DATE], [DATE], [DATE] and
earlier. PET-CT [DATE].

CLINICAL DATA: 66-year-old with 5 day history of shortness of
breath and nonproductive cough. Patient was seen at an [HOSPITAL]
on [REDACTED] and was started on antibiotic therapy and
corticosteroids without interval improvement in symptoms.

EXAM:
CT ANGIOGRAPHY CHEST WITH CONTRAST
TECHNIQUE: Multidetector CT imaging of the chest was performed using the
standard protocol during bolus administration of intravenous
contrast. Multiplanar CT image reconstructions and MIPs were
obtained to evaluate the vascular anatomy.
CONTRAST:  75mL OMNIPAQUE IOHEXOL 350 MG/ML IV.

[Series 5: thins · axial · 0.72mm/px · z∈[-348,-50]mm · 13 of 328 slices shown]
[im 15/328  lung]
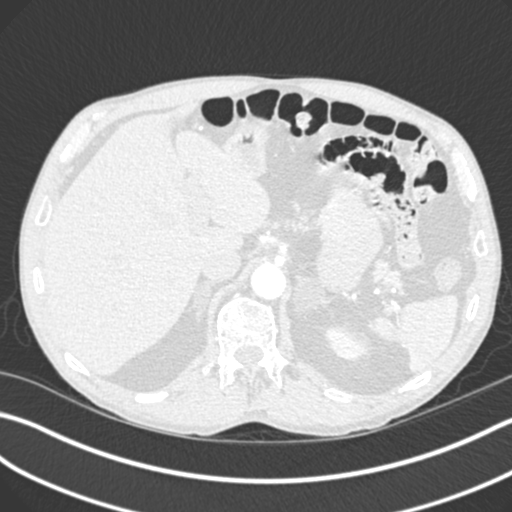
[im 43/328  soft-tissue]
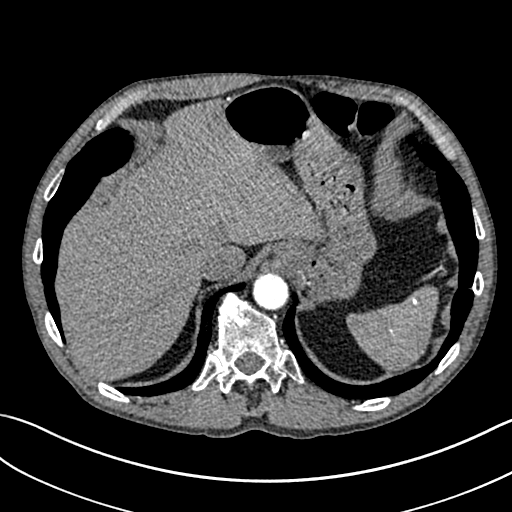
[im 72/328  lung]
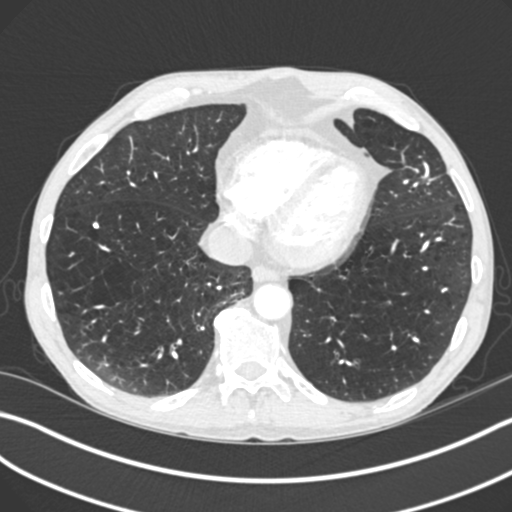
[im 86/328  soft-tissue]
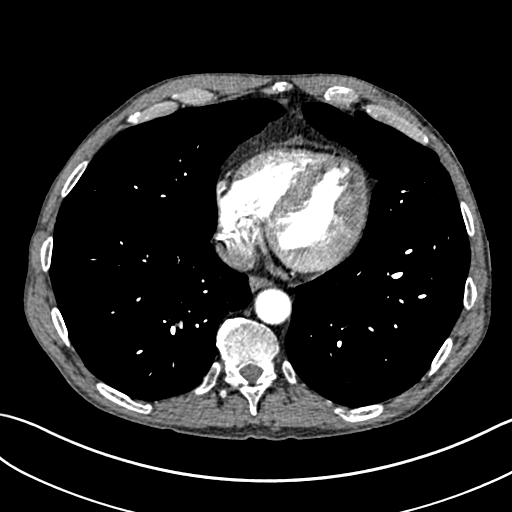
[im 114/328  lung]
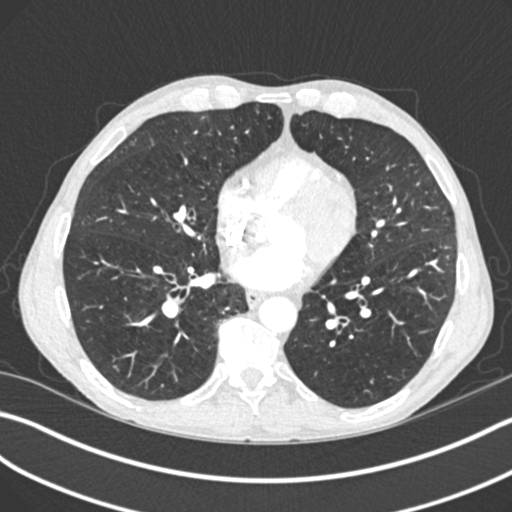
[im 143/328  soft-tissue]
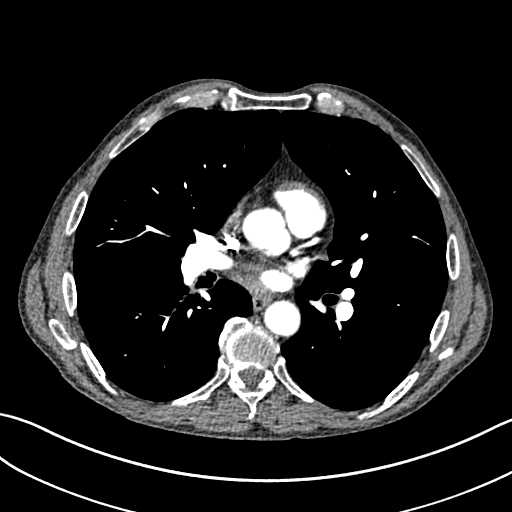
[im 171/328  lung]
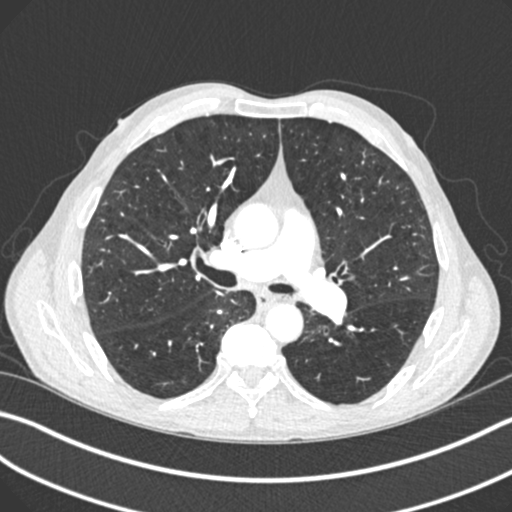
[im 185/328  soft-tissue]
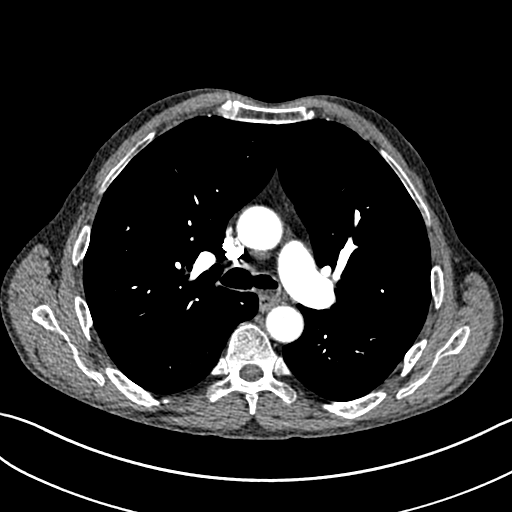
[im 214/328  lung]
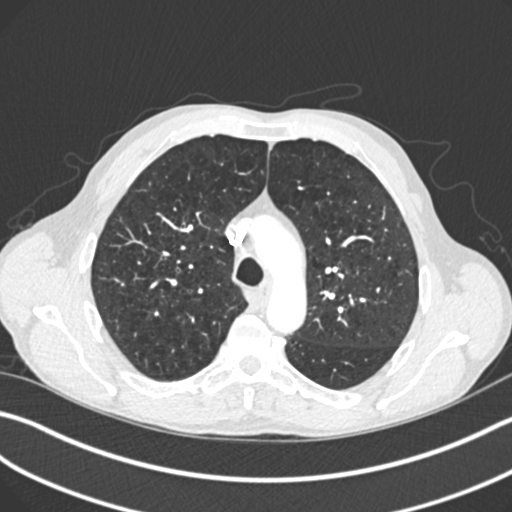
[im 242/328  soft-tissue]
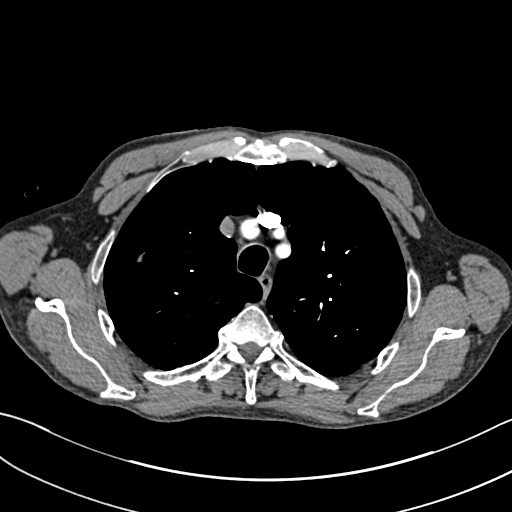
[im 256/328  lung]
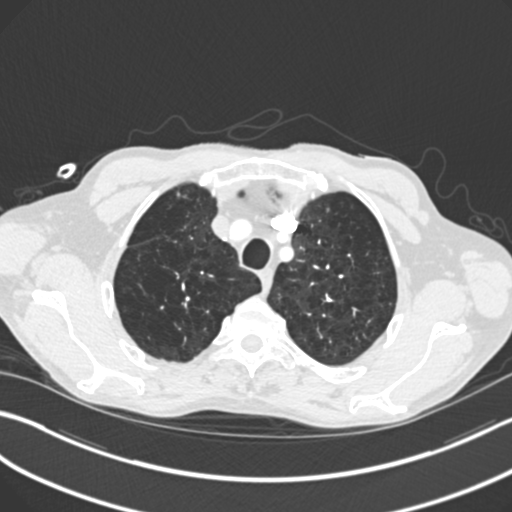
[im 285/328  soft-tissue]
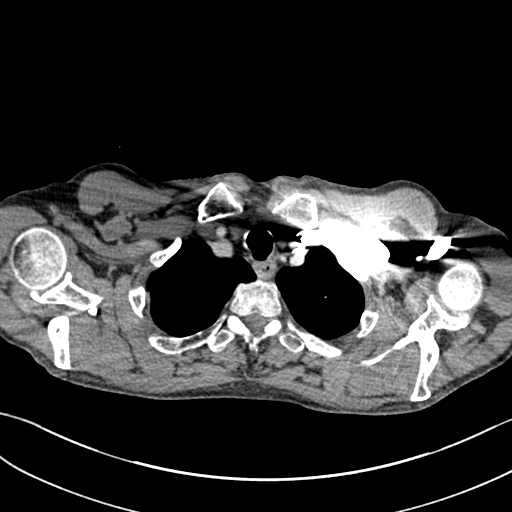
[im 313/328  lung]
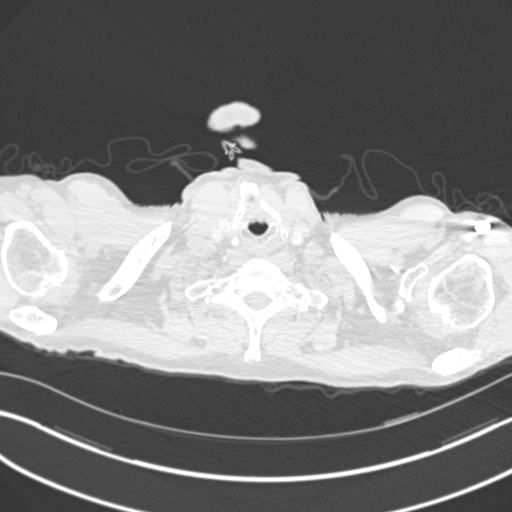

[Series 7: coronal mpr · coronal · 0.64mm/px · 3 of 83 slices shown]
[im 21/83  soft-tissue]
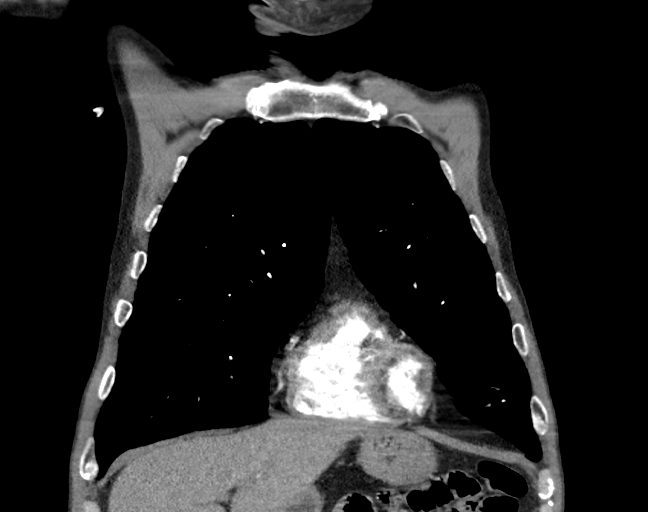
[im 42/83  soft-tissue]
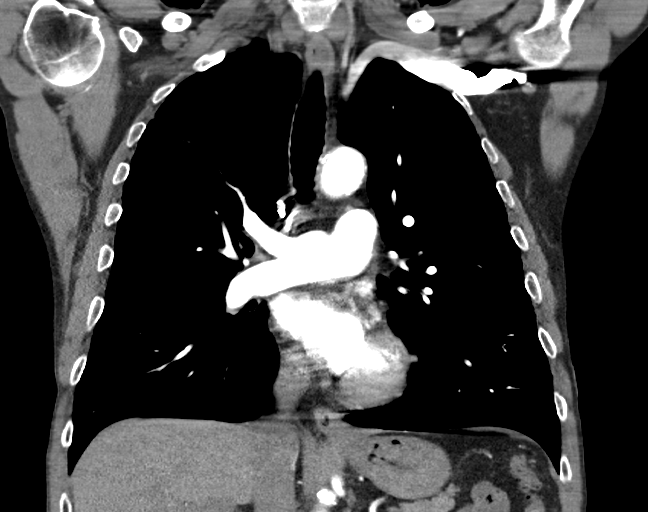
[im 62/83  soft-tissue]
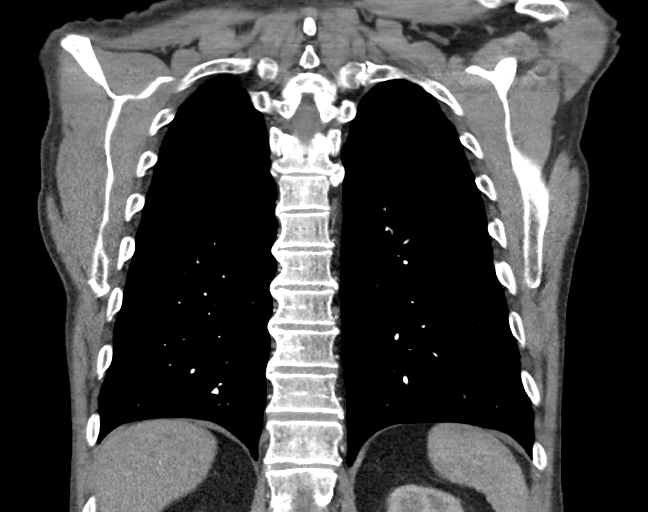

[16 of 46 positions shown; findings below may reference images not displayed]

FINDINGS: Cardiovascular: Contrast opacification of pulmonary arteries is
excellent. No filling defects within either main pulmonary artery or
their segmental branches in either lung to suggest pulmonary
embolism.

Normal heart size with likely LEFT ventricular hypertrophy. No
significant pericardial effusion. RIGHT coronary artery stent.
Moderate LAD coronary atherosclerosis.

Moderate atherosclerosis involving the thoracic and proximal
abdominal aorta without evidence of aneurysm or dissection. Proximal
great vessels widely patent. Celiac artery and SUPERIOR mesenteric
artery widely patent at their origins.

Mediastinum/Nodes: No pathologically enlarged mediastinal, hilar or
axillary lymph nodes. No mediastinal masses. Normal-appearing
esophagus. Normal-appearing thyroid gland.

Lungs/Pleura: Severe emphysematous changes throughout both lungs as
noted previously. In the POSTERIOR LEFT UPPER LOBE there is a 10 x
17 mm nodule (14 mm mean diameter) on image 31 of series 6. Even
though this nodule was hypermetabolic on the prior PET-CT, it has
not significantly changed in size dating back to [DATE].

In the RIGHT UPPER LOBE there is a 10 x 15 mm nodule (13 mm mean
diameter) on image 31 of series 6. This nodule is associated with
linear scarring and has decreased in size since [DATE]. It
demonstrated only low level metabolic activity on the prior PET-CT.

In the SUPERIOR segment LEFT LOWER LOBE there is a 3 x 7 mm nodule
(5 mm mean diameter) on image 47 of series 6. This nodule is
unchanged dating back to the [DATE] CT.

A 3 mm nodule in the LEFT lung apex is associated with parenchymal
scarring and is also unchanged dating back to the [DATE] CT.

No new or enlarging nodules in either lung. Scattered areas of
parenchymal scarring in both lungs. No confluent airspace
consolidation. No pleural effusions. No ground-glass lung opacities.

Upper Abdomen: Stable 2.9 x 1.3 cm adenoma involving the RIGHT
adrenal gland (Hounsfield measurement -15) and stable 3.2 x 2.7 cm
adenoma involving the LEFT adrenal gland (Hounsfield measurement 0).
Upper abdomen otherwise unremarkable for the early arterial phase of
enhancement.

Musculoskeletal: Degenerative changes and DISH involving the mid and
LOWER thoracic spine. No acute findings.

Review of the MIP images confirms the above findings.
IMPRESSION: 1. No evidence of pulmonary embolism.
2.  No acute cardiopulmonary disease.
3. BILATERAL lung nodules as described above, all of which are
either stable or decreased in size since [DATE]. (This
despite the fact that the LEFT UPPER LOBE lung nodule was
hypermetabolic on prior PET-CT). No new or enlarging lung nodules.
4. Stable BILATERAL adrenal adenomas

Aortic Atherosclerosis ([YW]-[YW]) and Emphysema ([YW]-[YW]).

## 2018-09-13 IMAGING — CR DG CHEST 2V
1 series · 2 of 2 positions shown · non-contrast
Comparison: Chest CT [DATE]; chest radiograph [DATE]

CLINICAL DATA: Shortness of breath and cough

EXAM:
CHEST - 2 VIEW

[Series 1: dg chest 2 view · 0.14mm/px · 2 of 2 slices shown]
[im 1/2]
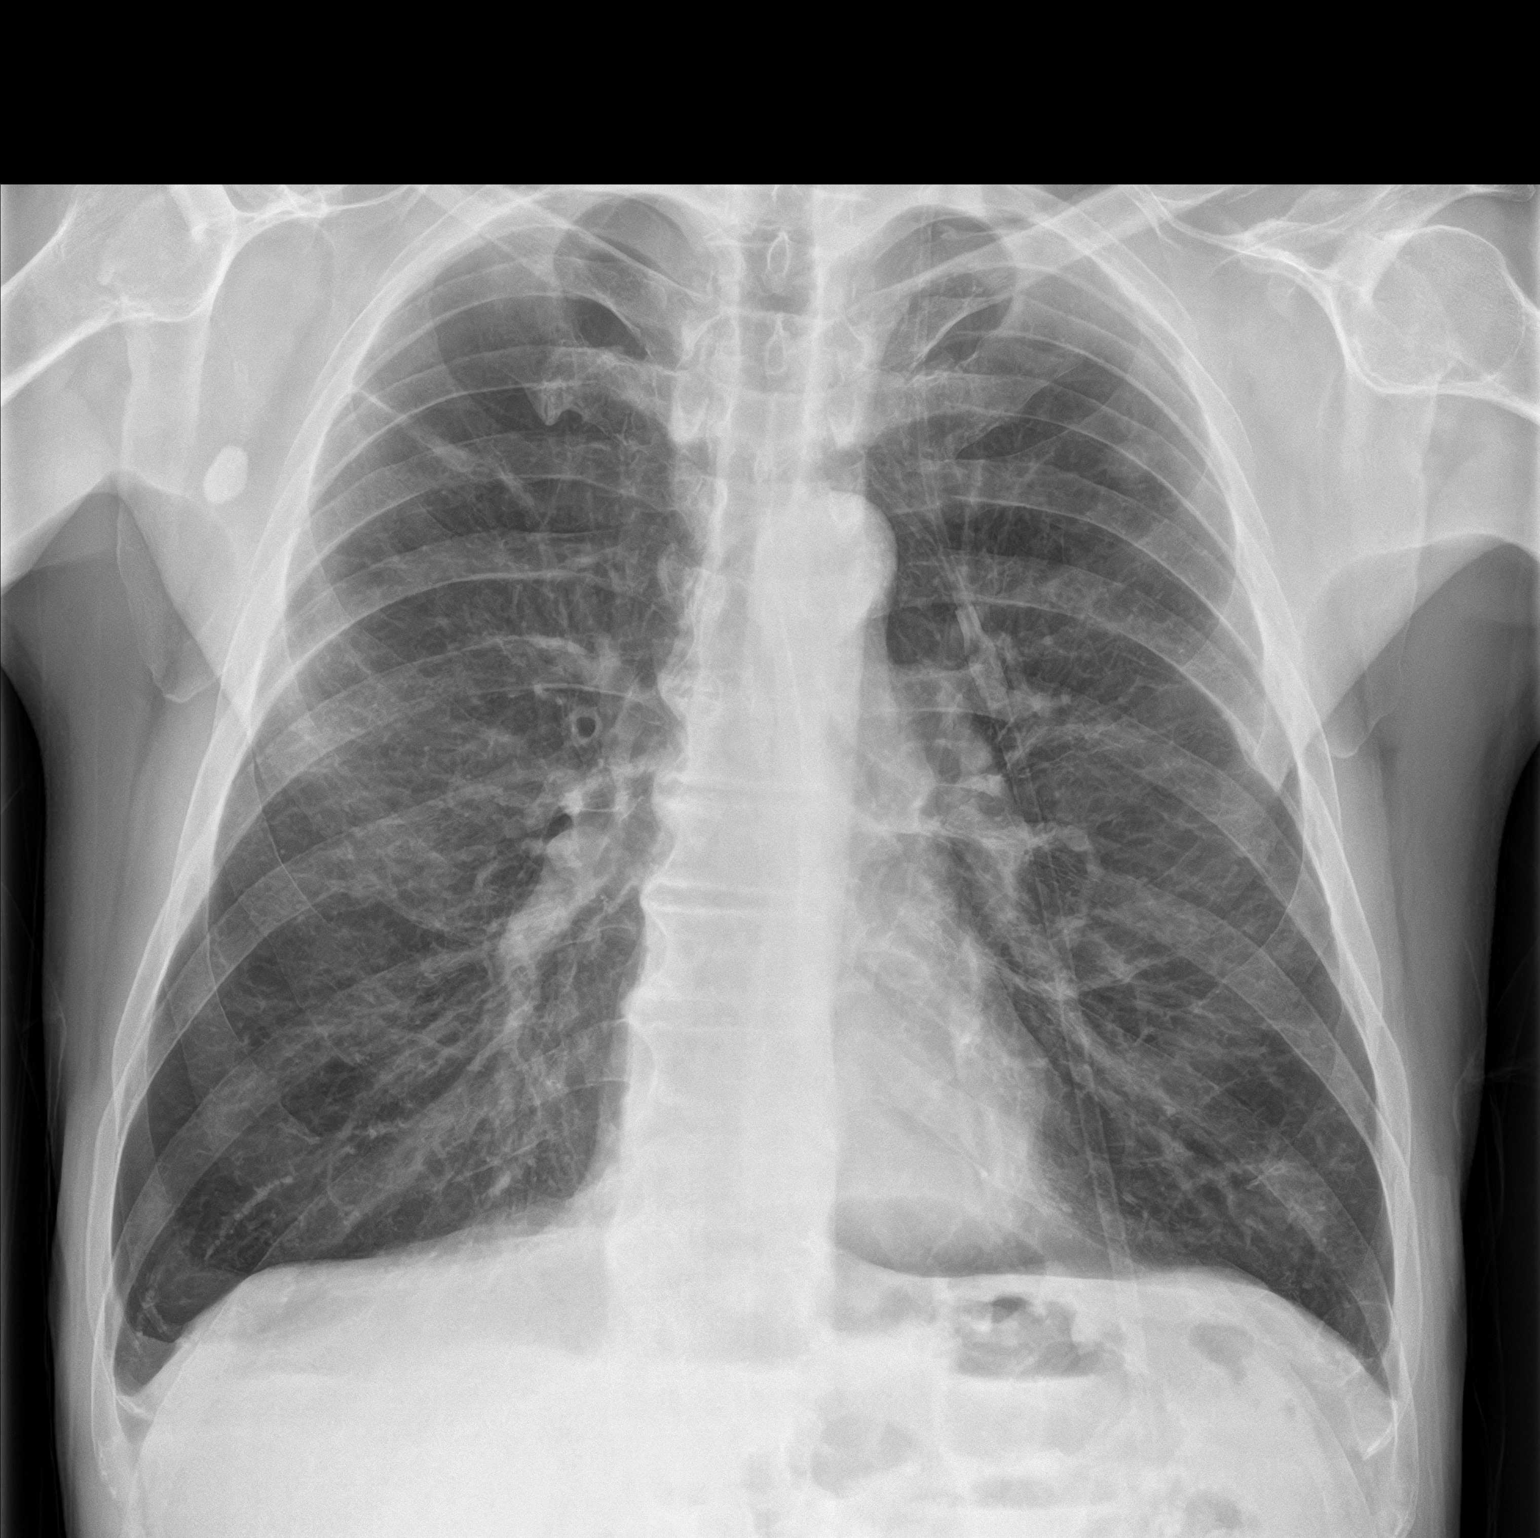
[im 2/2]
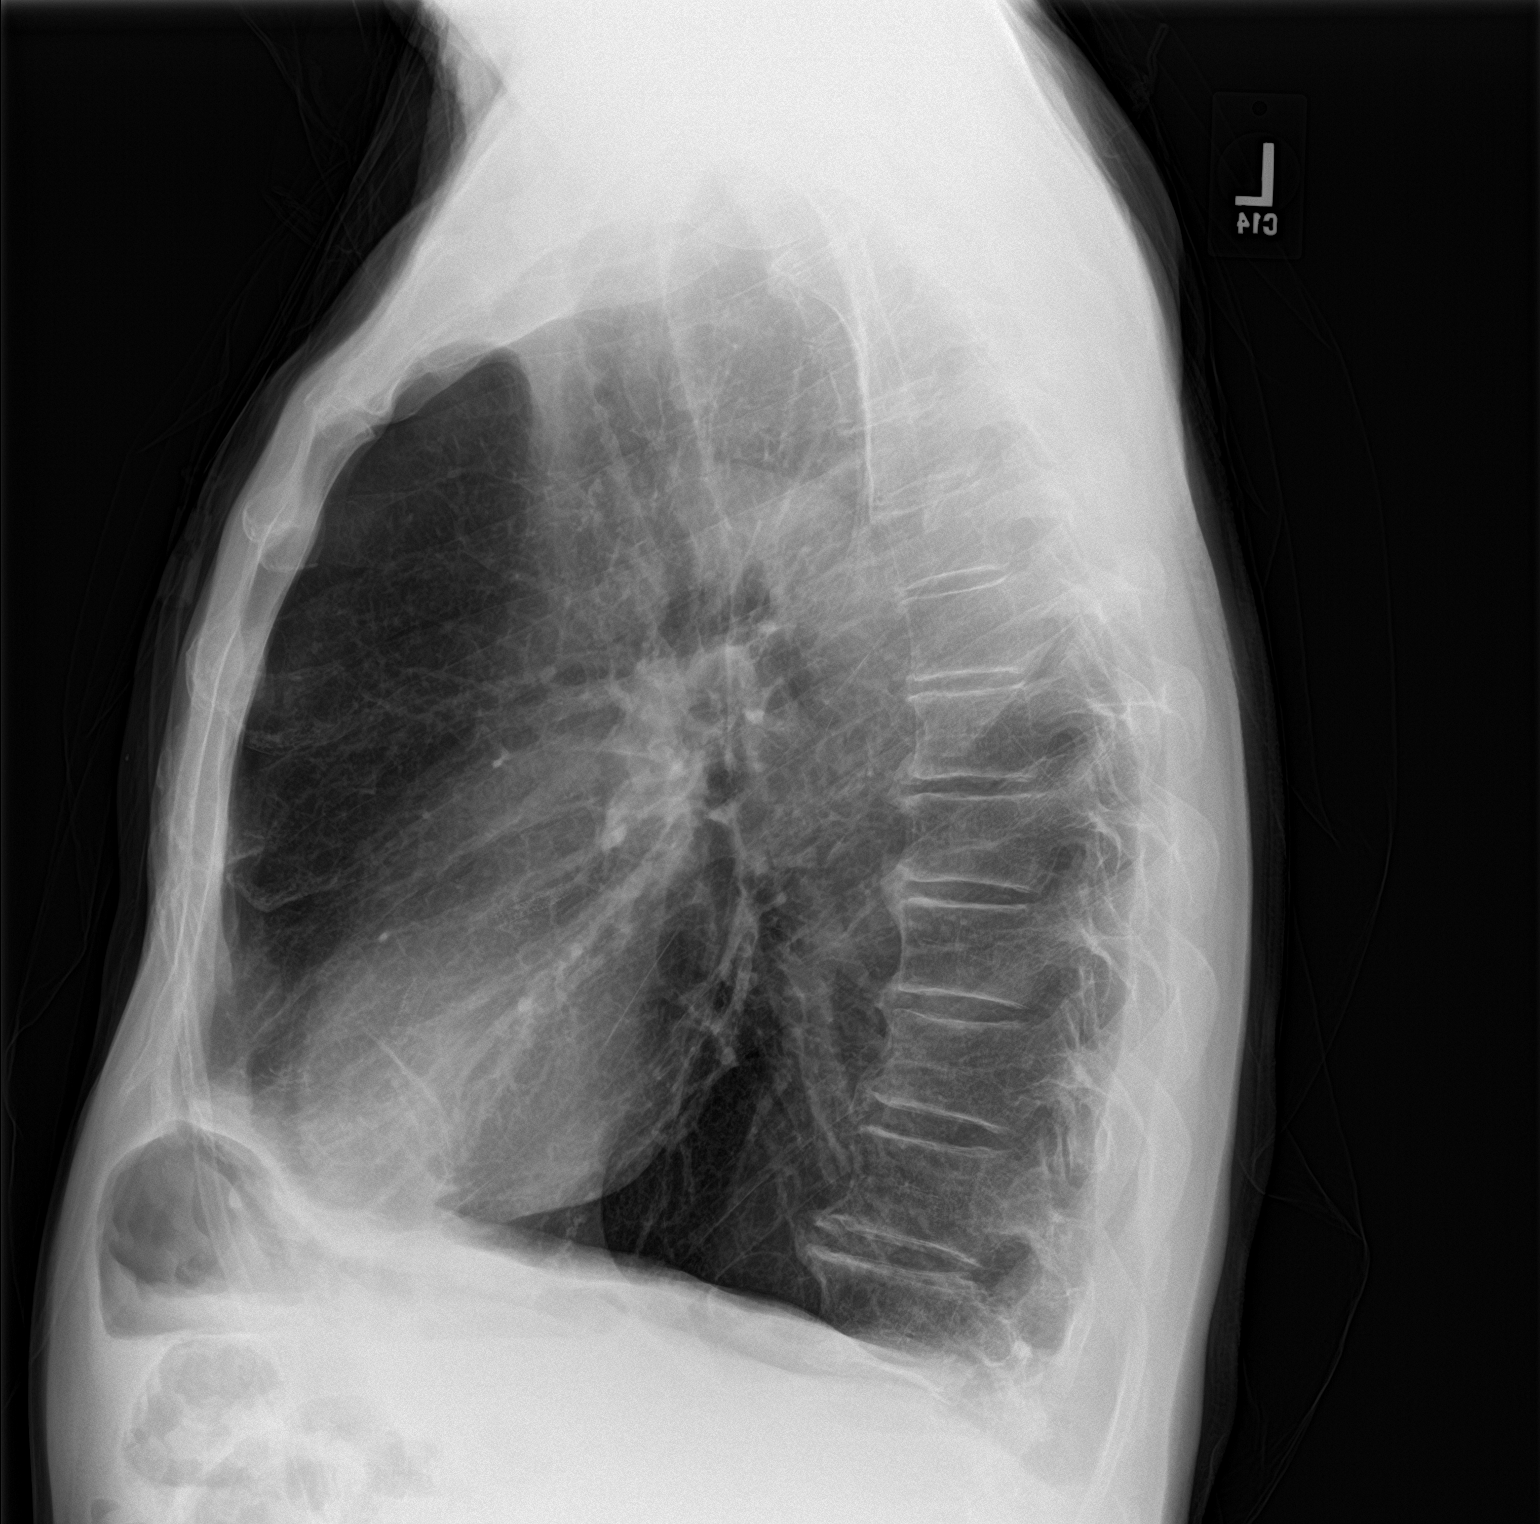

[2 of 2 positions shown; findings below may reference images not displayed]

FINDINGS: There is a 1 cm nodular opacity in the left upper lobe, stable from
prior CT and recent chest radiograph. There are scattered areas of
scarring in both lungs with underlying emphysematous change, better
delineated on prior CT. There is no edema or consolidation. The
heart size is normal. Pulmonary vascularity is stable and reflects
underlying emphysematous change. No evident adenopathy. There is
aortic atherosclerosis. No bone lesions.
IMPRESSION: Underlying emphysematous changes. Scattered areas of scarring. 1 cm
pulmonary nodular lesion left upper lobe, stable. No edema or
consolidation.

Stable cardiac silhouette.  There is aortic atherosclerosis.

Aortic Atherosclerosis ([Z3]-[Z3]) and Emphysema ([Z3]-[Z3]).

## 2018-09-13 MED ORDER — MAGNESIUM SULFATE 2 GM/50ML IV SOLN
2.0000 g | INTRAVENOUS | Status: AC
  Administered 2018-09-13: 2 g via INTRAVENOUS
  Filled 2018-09-13: qty 50

## 2018-09-13 MED ORDER — SUCRALFATE 1 GM/10ML PO SUSP
1.0000 g | Freq: Four times a day (QID) | ORAL | Status: DC | PRN
  Filled 2018-09-13: qty 10

## 2018-09-13 MED ORDER — SODIUM CHLORIDE 0.9 % IV SOLN
500.0000 mg | Freq: Once | INTRAVENOUS | Status: AC
  Administered 2018-09-13: 500 mg via INTRAVENOUS
  Filled 2018-09-13: qty 500

## 2018-09-13 MED ORDER — MIRTAZAPINE 15 MG PO TABS
30.0000 mg | ORAL_TABLET | Freq: Every day | ORAL | Status: DC
  Administered 2018-09-13 – 2018-09-14 (×2): 30 mg via ORAL
  Filled 2018-09-13 (×2): qty 2

## 2018-09-13 MED ORDER — IPRATROPIUM-ALBUTEROL 0.5-2.5 (3) MG/3ML IN SOLN
3.0000 mL | Freq: Four times a day (QID) | RESPIRATORY_TRACT | Status: DC
  Administered 2018-09-13 – 2018-09-15 (×7): 3 mL via RESPIRATORY_TRACT
  Filled 2018-09-13 (×8): qty 3

## 2018-09-13 MED ORDER — LORAZEPAM 1 MG PO TABS
1.0000 mg | ORAL_TABLET | Freq: Three times a day (TID) | ORAL | Status: DC | PRN
  Administered 2018-09-14 – 2018-09-15 (×4): 1 mg via ORAL
  Filled 2018-09-13 (×4): qty 1

## 2018-09-13 MED ORDER — ACETAMINOPHEN 650 MG RE SUPP: 650.0000 mg | Freq: Four times a day (QID) | RECTAL | Status: DC | PRN

## 2018-09-13 MED ORDER — ALBUTEROL SULFATE (2.5 MG/3ML) 0.083% IN NEBU
5.0000 mg | INHALATION_SOLUTION | Freq: Once | RESPIRATORY_TRACT | Status: AC
  Administered 2018-09-13: 5 mg via RESPIRATORY_TRACT
  Filled 2018-09-13: qty 6

## 2018-09-13 MED ORDER — SODIUM CHLORIDE 0.9 % IV SOLN
1.0000 g | INTRAVENOUS | Status: DC
  Administered 2018-09-14: 1 g via INTRAVENOUS
  Filled 2018-09-13: qty 1

## 2018-09-13 MED ORDER — ADULT MULTIVITAMIN W/MINERALS CH
1.0000 | ORAL_TABLET | Freq: Every day | ORAL | Status: DC
  Administered 2018-09-14 – 2018-09-15 (×2): 1 via ORAL
  Filled 2018-09-13 (×2): qty 1

## 2018-09-13 MED ORDER — SERTRALINE HCL 50 MG PO TABS
100.0000 mg | ORAL_TABLET | Freq: Every day | ORAL | Status: DC
  Administered 2018-09-14 – 2018-09-15 (×2): 100 mg via ORAL
  Filled 2018-09-13 (×2): qty 2

## 2018-09-13 MED ORDER — ENOXAPARIN SODIUM 40 MG/0.4ML ~~LOC~~ SOLN
40.0000 mg | SUBCUTANEOUS | Status: DC
  Administered 2018-09-13 – 2018-09-14 (×2): 40 mg via SUBCUTANEOUS
  Filled 2018-09-13 (×2): qty 0.4

## 2018-09-13 MED ORDER — ASPIRIN EC 81 MG PO TBEC
81.0000 mg | DELAYED_RELEASE_TABLET | Freq: Every day | ORAL | Status: DC
  Administered 2018-09-14 – 2018-09-15 (×2): 81 mg via ORAL
  Filled 2018-09-13 (×2): qty 1

## 2018-09-13 MED ORDER — ACETAMINOPHEN 325 MG PO TABS: 650.0000 mg | ORAL_TABLET | Freq: Four times a day (QID) | ORAL | Status: DC | PRN

## 2018-09-13 MED ORDER — METHYLPREDNISOLONE SODIUM SUCC 125 MG IJ SOLR
125.0000 mg | Freq: Once | INTRAMUSCULAR | Status: AC
  Administered 2018-09-13: 125 mg via INTRAVENOUS
  Filled 2018-09-13: qty 2

## 2018-09-13 MED ORDER — HYDROXYZINE HCL 25 MG PO TABS
25.0000 mg | ORAL_TABLET | Freq: Two times a day (BID) | ORAL | Status: DC | PRN
  Filled 2018-09-13: qty 1

## 2018-09-13 MED ORDER — IOHEXOL 350 MG/ML SOLN
75.0000 mL | Freq: Once | INTRAVENOUS | Status: AC | PRN
  Administered 2018-09-13: 75 mL via INTRAVENOUS

## 2018-09-13 MED ORDER — LOSARTAN POTASSIUM 25 MG PO TABS
25.0000 mg | ORAL_TABLET | Freq: Every day | ORAL | Status: DC
  Administered 2018-09-14 – 2018-09-15 (×2): 25 mg via ORAL
  Filled 2018-09-13 (×2): qty 1

## 2018-09-13 MED ORDER — FUROSEMIDE 20 MG PO TABS
20.0000 mg | ORAL_TABLET | Freq: Every day | ORAL | Status: DC
  Administered 2018-09-14 – 2018-09-15 (×2): 20 mg via ORAL
  Filled 2018-09-13 (×2): qty 1

## 2018-09-13 MED ORDER — IPRATROPIUM-ALBUTEROL 0.5-2.5 (3) MG/3ML IN SOLN
3.0000 mL | Freq: Once | RESPIRATORY_TRACT | Status: AC
  Administered 2018-09-13: 3 mL via RESPIRATORY_TRACT
  Filled 2018-09-13: qty 3

## 2018-09-13 MED ORDER — SODIUM CHLORIDE 0.9 % IV BOLUS
1000.0000 mL | Freq: Once | INTRAVENOUS | Status: AC
  Administered 2018-09-13: 1000 mL via INTRAVENOUS

## 2018-09-13 MED ORDER — AZITHROMYCIN 250 MG PO TABS
250.0000 mg | ORAL_TABLET | Freq: Every day | ORAL | Status: DC
  Administered 2018-09-14: 250 mg via ORAL
  Filled 2018-09-13: qty 1

## 2018-09-13 MED ORDER — TIOTROPIUM BROMIDE MONOHYDRATE 18 MCG IN CAPS
18.0000 ug | ORAL_CAPSULE | Freq: Every day | RESPIRATORY_TRACT | Status: DC
  Administered 2018-09-14: 18 ug via RESPIRATORY_TRACT
  Filled 2018-09-13 (×2): qty 5

## 2018-09-13 MED ORDER — BUDESONIDE 0.5 MG/2ML IN SUSP
0.5000 mg | Freq: Two times a day (BID) | RESPIRATORY_TRACT | Status: DC
  Administered 2018-09-13 – 2018-09-15 (×4): 0.5 mg via RESPIRATORY_TRACT
  Filled 2018-09-13 (×4): qty 2

## 2018-09-13 MED ORDER — SODIUM CHLORIDE 0.9 % IV SOLN
2.0000 g | Freq: Once | INTRAVENOUS | Status: AC
  Administered 2018-09-13: 2 g via INTRAVENOUS
  Filled 2018-09-13: qty 20

## 2018-09-13 MED ORDER — ARIPIPRAZOLE 5 MG PO TABS
5.0000 mg | ORAL_TABLET | Freq: Every day | ORAL | Status: DC
  Administered 2018-09-14 – 2018-09-15 (×2): 5 mg via ORAL
  Filled 2018-09-13 (×2): qty 1

## 2018-09-13 MED ORDER — METHYLPREDNISOLONE SODIUM SUCC 40 MG IJ SOLR
40.0000 mg | Freq: Two times a day (BID) | INTRAMUSCULAR | Status: DC
  Administered 2018-09-14: 40 mg via INTRAVENOUS
  Filled 2018-09-13: qty 1

## 2018-09-13 MED ORDER — NITROGLYCERIN 0.4 MG SL SUBL: 0.4000 mg | SUBLINGUAL_TABLET | SUBLINGUAL | Status: DC | PRN

## 2018-09-13 MED ORDER — ATORVASTATIN CALCIUM 20 MG PO TABS
40.0000 mg | ORAL_TABLET | Freq: Every day | ORAL | Status: DC
  Administered 2018-09-13 – 2018-09-15 (×3): 40 mg via ORAL
  Filled 2018-09-13 (×3): qty 2

## 2018-09-13 NOTE — ED Notes (Signed)
Hospitalist to bedside at this time 

## 2018-09-13 NOTE — H&P (Addendum)
[facility] [place] NAME: Leslie Sims    MR#:  [id number]  DATE OF BIRTH:  [birth year]  DATE OF ADMISSION:  09/13/2018  PRIMARY CARE PHYSICIAN: [name], MD   REQUESTING/REFERRING PHYSICIAN: Dr. Brenton Grills  CHIEF COMPLAINT:   Chief Complaint  Patient presents with  . Shortness of Breath    HISTORY OF PRESENT ILLNESS:  Leslie Sims  is a 67 y.o. male with a known history of COPD and wears oxygen at night.  He is presenting with shortness of breath.  He states he has not been feeling well.  On Friday he was exposed to some ether in the face.  On Saturday he went to urgent care and was given antibiotics and prednisone.  He feels like he is getting worse and worse.  Feels short of breath coughing up some light green phlegm and wheezing.  Hospitalist services were contacted for COPD exacerbation admission  PAST MEDICAL HISTORY:   Past Medical History:  Diagnosis Date  . Allergic rhinitis   . Anxiety   . Back pain   . Colon polyps 08/2012   colonoscopy; multiple colon polyps; repeat colonoscopy in one year.  [name] COPD (chronic obstructive pulmonary disease) ([place])   . Coronary artery disease 11/2009   a. late presenting ant MI; b. LHC 100% pLAD s/p PCI/DES, 99% mRCA s/p PCI/DES, EF 35%; c. nuclear stress test 05/13: prior ant/inf infarcts w/o ischemia, EF 43%; d. LHC 02/14: widely patent stents with no other obs dz, EF 40%; e. LHC 11/17: LM nl, mLAD 10%, patent LAD stent, dLAD 20%, p-mRCA 10%, mRCA 40%, patent RCA stent, EF 35-45%   . Depression   . GERD (gastroesophageal reflux disease)   . Headache(784.0)   . Helicobacter pylori (H. pylori)   . Hemorrhoids   . Hernia    inguinal  . Hyperlipidemia   . Hypertension   . Insomnia   . Ischemic cardiomyopathy   . Myalgia   . Peptic ulcer   . Pulmonary nodules   . ST elevation (STEMI) myocardial infarction involving left anterior descending coronary artery ([place])  11/2009   a. s/p PCI to the LAD  . Status post dilation of esophageal narrowing 2000  . Tinea pedis   . Wears dentures    partial upper    PAST SURGICAL HISTORY:   Past Surgical History:  Procedure Laterality Date  . Admission  12/20/2012   COPD exacerbation.  [place].  [name] CARDIAC CATHETERIZATION    . CARDIAC CATHETERIZATION  02/05/13   [facility]  . CARDIAC CATHETERIZATION  10/14   [facility] : patent stents with no change in anatomy. EF: 40$  . CARDIAC CATHETERIZATION Left 11/12/2016   Procedure: Left Heart Cath and Coronary Angiography;  Surgeon: Wellington Hampshire, MD;  Location: [facility];  Service: Cardiovascular;  Laterality: Left;  . COLONOSCOPY    . COLONOSCOPY WITH PROPOFOL N/A 05/18/2016   Procedure: COLONOSCOPY WITH PROPOFOL;  Surgeon: Lucilla Lame, MD;  Location: [facility] ENDOSCOPY;  Service: Endoscopy;  Laterality: N/A;  . CORONARY ANGIOPLASTY WITH STENT PLACEMENT  09/2009   LAD 3.0 X23 mm Xience DES, RCA: 4.0 X 15 mm Xience DES  . ELECTROMAGNETIC NAVIGATION BROCHOSCOPY N/A 10/27/2017   Procedure: ELECTROMAGNETIC NAVIGATION BRONCHOSCOPY;  Surgeon: Flora Lipps, MD;  Location: [facility] ORS;  Service: Cardiopulmonary;  Laterality: N/A;  . ESOPHAGEAL DILATION    . ESOPHAGOGASTRODUODENOSCOPY  2008  . ESOPHAGOGASTRODUODENOSCOPY  08/20/2012  . HERNIA REPAIR  07/20/2012  L inguinal hernia repair  . PENILE PROSTHESIS IMPLANT      SOCIAL HISTORY:   Social History   Tobacco Use  . Smoking status: Current Every Day Smoker    Packs/day: 0.50    Years: 42.00    Pack years: 21.00    Types: Cigarettes  . Smokeless tobacco: Never Used  Substance Use Topics  . Alcohol use: No    Alcohol/week: 0.0 standard drinks    FAMILY HISTORY:   Family History  Problem Relation Age of Onset  . Heart attack Brother        Brother #1  . Diabetes Brother   . Hypertension Brother        #3  . Coronary artery disease Father 59       deceased  . Heart attack Father   . Diabetes Father   . Heart  disease Father   . COPD Mother 71       deceased  . Alcohol abuse Sister        polysubstance abuse  . COPD Sister   . Lung cancer Sister   . Alcohol abuse Sister        polysubstance abuse  . Penile cancer Brother   . Diabetes Brother     DRUG ALLERGIES:   Allergies  Allergen Reactions  . Prozac [Fluoxetine Hcl] Shortness Of Breath  . Effexor Xr [Venlafaxine Hcl Er] Other (See Comments)    "Makes me feel funny"  . Wellbutrin [Bupropion] Other (See Comments)    "Makes me feel funny"    REVIEW OF SYSTEMS:  CONSTITUTIONAL: No fever, Chills or sweats.  Positive for fatigue.  EYES: No blurred or double vision.  Wears reading glasses EARS, NOSE, AND THROAT: No tinnitus or ear pain.  Positive for sore throat.  Dysphasia to solids RESPIRATORY: Positive for cough, shortness of breath, and wheezing.  no hemoptysis.  CARDIOVASCULAR: No chest pain, orthopnea, edema.  GASTROINTESTINAL: No nausea, vomiting, diarrhea or abdominal pain. No blood in bowel movements GENITOURINARY: No dysuria, hematuria.  ENDOCRINE: No polyuria, nocturia,  HEMATOLOGY: No anemia, easy bruising or bleeding SKIN: No rash or lesion. MUSCULOSKELETAL: No joint pain or arthritis.   NEUROLOGIC: No tingling, numbness, weakness.  PSYCHIATRY: No anxiety or depression.   MEDICATIONS AT HOME:   Prior to Admission medications   Medication Sig Start Date End Date Taking? Authorizing Provider  acetaminophen (TYLENOL) 325 MG tablet Take 2 tablets (650 mg total) by mouth every 6 (six) hours as needed for mild pain (or Fever >/= 101). 08/26/18   Gouru, Aruna, MD  albuterol (VENTOLIN HFA) 108 (90 Base) MCG/ACT inhaler USE 2 INHALATIONS EVERY 6 HOURS AS NEEDED FOR WHEEZING 08/19/17   [name], MD  ARIPiprazole (ABILIFY) 5 MG tablet Take 5 mg by mouth daily. 07/26/18   [provider]  aspirin 81 MG tablet Take 81 mg by mouth daily.      [provider]  atorvastatin (LIPITOR) 40 MG tablet Take 1 tablet  (40 mg total) by mouth daily. 03/03/18   [name], MD  Fluticasone-Salmeterol (ADVAIR DISKUS) 500-50 MCG/DOSE AEPB Inhale 1 puff into the lungs 2 (two) times daily. 07/27/17   [name], MD  furosemide (LASIX) 20 MG tablet Take 1 tablet (20 mg total) by mouth daily. 07/07/18 08/06/18  [name], MD  hydrOXYzine (VISTARIL) 25 MG capsule Take 25 mg by mouth 2 (two) times daily as needed. 06/05/18   [provider]  ipratropium-albuterol (DUONEB) 0.5-2.5 (3)  MG/3ML SOLN Take 3 mLs by nebulization every 4 (four) hours as needed. 06/24/18   [name], MD  LORazepam (ATIVAN) 1 MG tablet Take 1 tablet (1 mg total) by mouth every 8 (eight) hours as needed for anxiety. 07/07/18   [name], MD  losartan (COZAAR) 25 MG tablet TAKE 1 TABLET DAILY 01/27/18   Minna Merritts, MD  mirtazapine (REMERON) 30 MG tablet Take 1 tablet (30 mg total) by mouth at bedtime. 06/24/18   [name], MD  Multiple Vitamin (MULTIVITAMIN) capsule Take 1 capsule by mouth daily.    [provider]  nitroGLYCERIN (NITROSTAT) 0.4 MG SL tablet Place 1 tablet (0.4 mg total) under the tongue every 5 (five) minutes as needed. 10/17/17   Wellington Hampshire, MD  sertraline (ZOLOFT) 100 MG tablet Take 100 mg by mouth daily.  06/28/18   [provider]  SPIRIVA HANDIHALER 18 MCG inhalation capsule PLACE 1 CAPSULE INTO INHALER AND INHALE THE CONTENTS DAILY 07/26/18   Trinna Post, PA-C  sucralfate (CARAFATE) 1 GM/10ML suspension Take 10 mLs (1 g total) by mouth 4 (four) times daily -  with meals and at bedtime. Patient taking differently: Take 1 g by mouth 4 (four) times daily as needed.  05/16/18   [name], MD      VITAL SIGNS:  Blood pressure 115/62, pulse 92, temperature 98 F (36.7 C), temperature source Oral, resp. rate 16, height 5\' 10"  (1.778 m), weight 65.5 kg, SpO2 97 %.  PHYSICAL EXAMINATION:  GENERAL:  67 y.o.-year-old patient lying in the bed with no acute distress.   EYES: Pupils equal, round, reactive to light and accommodation. No scleral icterus. Extraocular muscles intact.  HEENT: Head atraumatic, normocephalic. Oropharynx and nasopharynx clear.  NECK:  Supple, no jugular venous distention. No thyroid enlargement, no tenderness.  LUNGS: Decreased breath sounds bilaterally, positive expiratory wheezing. No use of accessory muscles of respiration.  CARDIOVASCULAR: S1, S2 normal. No murmurs, rubs, or gallops.  ABDOMEN: Soft, nontender, nondistended. Bowel sounds present. No organomegaly or mass.  EXTREMITIES: No pedal edema, cyanosis, or clubbing.  NEUROLOGIC: Cranial nerves II through XII are intact. Muscle strength 5/5 in all extremities. Sensation intact. Gait not checked.  PSYCHIATRIC: The patient is alert and oriented x 3.  SKIN: No rash, lesion, or ulcer.   LABORATORY PANEL:   CBC Recent Labs  Lab 09/13/18 1240  WBC 7.6  HGB 14.4  HCT 42.6  PLT 269   ------------------------------------------------------------------------------------------------------------------  Chemistries  Recent Labs  Lab 09/13/18 1240  NA 139  K 4.0  CL 96*  CO2 32  GLUCOSE 178*  BUN 13  CREATININE 0.85  CALCIUM 9.4   ------------------------------------------------------------------------------------------------------------------   RADIOLOGY:  Dg Chest 2 View  Result Date: 09/13/2018 CLINICAL DATA:  Shortness of breath and cough EXAM: CHEST - 2 VIEW COMPARISON:  Chest CT June 01, 2018; chest radiograph August 24, 2018 FINDINGS: There is a 1 cm nodular opacity in the left upper lobe, stable from prior CT and recent chest radiograph. There are scattered areas of scarring in both lungs with underlying emphysematous change, better delineated on prior CT. There is no edema or consolidation. The heart size is normal. Pulmonary vascularity is stable and reflects underlying emphysematous change. No evident adenopathy. There is aortic atherosclerosis. No bone  lesions. IMPRESSION: Underlying emphysematous changes. Scattered areas of scarring. 1 cm pulmonary nodular lesion left upper lobe, stable. No edema or consolidation. Stable cardiac silhouette.  There is aortic atherosclerosis. Aortic  Atherosclerosis (ICD10-I70.0) and Emphysema (ICD10-J43.9). Electronically Signed   By: Lowella Grip III M.D.   On: 09/13/2018 13:26   Ct Angio Chest Pe W And/or Wo Contrast  Result Date: 09/13/2018 CLINICAL DATA:  67 year old with 5 day history of shortness of breath and nonproductive cough. Patient was seen at an urgent care on Saturday and was started on antibiotic therapy and corticosteroids without interval improvement in symptoms. EXAM: CT ANGIOGRAPHY CHEST WITH CONTRAST TECHNIQUE: Multidetector CT imaging of the chest was performed using the standard protocol during bolus administration of intravenous contrast. Multiplanar CT image reconstructions and MIPs were obtained to evaluate the vascular anatomy. CONTRAST:  60mL OMNIPAQUE IOHEXOL 350 MG/ML IV. COMPARISON:  CT/CTA chest 06/01/2018, 04/27/2018, 02/01/2018 and earlier. PET-CT 10/13/2017. FINDINGS: Cardiovascular: Contrast opacification of pulmonary arteries is excellent. No filling defects within either main pulmonary artery or their segmental branches in either lung to suggest pulmonary embolism. Normal heart size with likely LEFT ventricular hypertrophy. No significant pericardial effusion. RIGHT coronary artery stent. Moderate LAD coronary atherosclerosis. Moderate atherosclerosis involving the thoracic and proximal abdominal aorta without evidence of aneurysm or dissection. Proximal great vessels widely patent. Celiac artery and SUPERIOR mesenteric artery widely patent at their origins. Mediastinum/Nodes: No pathologically enlarged mediastinal, hilar or axillary lymph nodes. No mediastinal masses. Normal-appearing esophagus. Normal-appearing thyroid gland. Lungs/Pleura: Severe emphysematous changes throughout both  lungs as noted previously. In the POSTERIOR LEFT UPPER LOBE there is a 10 x 17 mm nodule (14 mm mean diameter) on image 31 of series 6. Even though this nodule was hypermetabolic on the prior PET-CT, it has not significantly changed in size dating back to October, 2018. In the RIGHT UPPER LOBE there is a 10 x 15 mm nodule (13 mm mean diameter) on image 31 of series 6. This nodule is associated with linear scarring and has decreased in size since October, 2018. It demonstrated only low level metabolic activity on the prior PET-CT. In the SUPERIOR segment LEFT LOWER LOBE there is a 3 x 7 mm nodule (5 mm mean diameter) on image 47 of series 6. This nodule is unchanged dating back to the October, 2018 CT. A 3 mm nodule in the LEFT lung apex is associated with parenchymal scarring and is also unchanged dating back to the October, 2018 CT. No new or enlarging nodules in either lung. Scattered areas of parenchymal scarring in both lungs. No confluent airspace consolidation. No pleural effusions. No ground-glass lung opacities. Upper Abdomen: Stable 2.9 x 1.3 cm adenoma involving the RIGHT adrenal gland (Hounsfield measurement -15) and stable 3.2 x 2.7 cm adenoma involving the LEFT adrenal gland (Hounsfield measurement 0). Upper abdomen otherwise unremarkable for the early arterial phase of enhancement. 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 No acute findings. Review of the MIP images confirms the above findings. IMPRESSION: 1. No evidence of pulmonary embolism. 2.  No acute cardiopulmonary disease. 3. BILATERAL lung nodules as described above, all of which are either stable or decreased in size since October, 2018. (This despite the fact that the LEFT UPPER LOBE lung nodule was hypermetabolic on prior PET-CT). No new or enlarging lung nodules. 4. Stable BILATERAL adrenal adenomas Aortic Atherosclerosis (ICD10-I70.0) and Emphysema (ICD10-J43.9). Electronically Signed   By:  Evangeline Dakin M.D.   On: 09/13/2018 17:48    EKG:   Sinus tachycardia 102 bpm no acute ST-T wave changes  IMPRESSION AND PLAN:   1.  Acute hypoxic and hypercapnic respiratory failure.  PCO2 87 on venous  blood gas.  Start BiPAP at night.  May be a candidate for trilogy at home. 2.  COPD exacerbation.  Start on Solu-Medrol 40 mg IV twice daily.  Patient given 125 mg of Solu-Medrol in the emergency room.  Nebulizer treatments with budesonide and DuoNeb. 3.  History of CAD on aspirin and Lipitor 4.  Depression on Zoloft 5.  Hyperlipidemia unspecified on Lipitor 6.  Anxiety depression on Ativan and Abilify and Remeron and Zoloft 7.  Tobacco abuse.  Smoking cessation counseling done 4 minutes by me nicotine patch ordered  All laboratory and radiological data reviewed. Management plans discussed with the patient, family and they are in agreement.  CODE STATUS: full code  TOTAL TIME TAKING CARE OF THIS PATIENT: 50 minutes, including acp time.    Loletha Grayer M.D on 09/13/2018 at 9:49 PM  Between 7am to 6pm - Pager - [phone]  After 6pm call admission pager [phone]  [facility]  [phone]  CC: Primary care physician; [name], MD

## 2018-09-13 NOTE — ED Provider Notes (Signed)
[facility] Emergency Department Provider Note  ____________________________________________  Time seen: Approximately 7:28 PM  I have reviewed the triage vital signs and the nursing notes.   HISTORY  Chief Complaint Shortness of Breath    HPI Leslie Sims is a 67 y.o. male with a history of COPD GERD hypertension and CAD status post STEMI who comes to the ED complaining of shortness of breath for the past 5 days.  Gradual onset, constant, waxing and waning.  Worse with exertion and walking, no alleviating factors.  Saw his pulmonologist 4 days ago who started him on prednisone in addition to all his bronchodilators.   However he has not had any improvement in his symptoms in this time.  Does have increased sputum production.  No fever or chills.     Past Medical History:  Diagnosis Date  . Allergic rhinitis   . Anxiety   . Back pain   . Colon polyps 08/2012   colonoscopy; multiple colon polyps; repeat colonoscopy in one year.  [name] COPD (chronic obstructive pulmonary disease) ([place])   . Coronary artery disease 11/2009   a. late presenting ant MI; b. LHC 100% pLAD s/p PCI/DES, 99% mRCA s/p PCI/DES, EF 35%; c. nuclear stress test 05/13: prior ant/inf infarcts w/o ischemia, EF 43%; d. LHC 02/14: widely patent stents with no other obs dz, EF 40%; e. LHC 11/17: LM nl, mLAD 10%, patent LAD stent, dLAD 20%, p-mRCA 10%, mRCA 40%, patent RCA stent, EF 35-45%   . Depression   . GERD (gastroesophageal reflux disease)   . Headache(784.0)   . Helicobacter pylori (H. pylori)   . Hemorrhoids   . Hernia    inguinal  . Hyperlipidemia   . Hypertension   . Insomnia   . Ischemic cardiomyopathy   . Myalgia   . Peptic ulcer   . ST elevation (STEMI) myocardial infarction involving left anterior descending coronary artery ([name]) 11/2009   a. s/p PCI to the LAD  . Status post dilation of esophageal narrowing 2000  . Tinea pedis   . Wears dentures    partial upper      Patient Active Problem List   Diagnosis Date Noted  . Acute on chronic respiratory failure with hypoxia and hypercapnia ([place]) 08/24/2018  . Chest pain 07/02/2018  . Grief reaction 01/13/2018  . COPD with hypoxia ([place]) 12/25/2017  . Unstable angina ([place])   . Benign neoplasm of ascending colon   . Benign neoplasm of descending colon   . Benign neoplasm of sigmoid colon   . Personal history of colonic polyps   . Pulmonary nodules/lesions, multiple 01/05/2015  . Bruit of left carotid artery 11/04/2014  . Sleep disorder 07/18/2013  . Seasonal and perennial allergic rhinitis 05/29/2013  . Bradycardia 04/20/2011  . SMOKER 07/30/2010  . CAD, NATIVE VESSEL 04/23/2010  . Hyperlipidemia 03/24/2010  . DEPRESSION/ANXIETY 03/24/2010  . Chronic systolic heart failure ([place]) 03/24/2010  . COPD, very severe ([place]) 03/24/2010  . GERD 03/24/2010  . Essential hypertension 11/21/2009     Past Surgical History:  Procedure Laterality Date  . Admission  12/20/2012   COPD exacerbation.  [place].  [name] CARDIAC CATHETERIZATION    . CARDIAC CATHETERIZATION  02/05/13   [facility]  . CARDIAC CATHETERIZATION  10/14   [facility] : patent stents with no change in anatomy. EF: 40$  . CARDIAC CATHETERIZATION Left 11/12/2016   Procedure: Left Heart Cath and Coronary Angiography;  Surgeon: Wellington Hampshire, MD;  Location: [place] [facility];  Service: Cardiovascular;  Laterality: Left;  . COLONOSCOPY    . COLONOSCOPY WITH PROPOFOL N/A 05/18/2016   Procedure: COLONOSCOPY WITH PROPOFOL;  Surgeon: Lucilla Lame, MD;  Location: [facility] ENDOSCOPY;  Service: Endoscopy;  Laterality: N/A;  . CORONARY ANGIOPLASTY WITH STENT PLACEMENT  09/2009   LAD 3.0 X23 mm Xience DES, RCA: 4.0 X 15 mm Xience DES  . ELECTROMAGNETIC NAVIGATION BROCHOSCOPY N/A 10/27/2017   Procedure: ELECTROMAGNETIC NAVIGATION BRONCHOSCOPY;  Surgeon: Flora Lipps, MD;  Location: [facility] ORS;  Service: Cardiopulmonary;  Laterality: N/A;  . ESOPHAGEAL DILATION    .  ESOPHAGOGASTRODUODENOSCOPY  2008  . ESOPHAGOGASTRODUODENOSCOPY  08/20/2012  . HERNIA REPAIR  07/20/2012   L inguinal hernia repair  . PENILE PROSTHESIS IMPLANT       Prior to Admission medications   Medication Sig Start Date End Date Taking? Authorizing Provider  acetaminophen (TYLENOL) 325 MG tablet Take 2 tablets (650 mg total) by mouth every 6 (six) hours as needed for mild pain (or Fever >/= 101). 08/26/18   Gouru, Aruna, MD  albuterol (VENTOLIN HFA) 108 (90 Base) MCG/ACT inhaler USE 2 INHALATIONS EVERY 6 HOURS AS NEEDED FOR WHEEZING 08/19/17   [name], MD  ARIPiprazole (ABILIFY) 5 MG tablet Take 5 mg by mouth daily. 07/26/18   [provider]  aspirin 81 MG tablet Take 81 mg by mouth daily.      [provider]  atorvastatin (LIPITOR) 40 MG tablet Take 1 tablet (40 mg total) by mouth daily. 03/03/18   [name], MD  Fluticasone-Salmeterol (ADVAIR DISKUS) 500-50 MCG/DOSE AEPB Inhale 1 puff into the lungs 2 (two) times daily. 07/27/17   [name], MD  furosemide (LASIX) 20 MG tablet Take 1 tablet (20 mg total) by mouth daily. 07/07/18 08/06/18  [name], MD  hydrOXYzine (VISTARIL) 25 MG capsule Take 25 mg by mouth 2 (two) times daily as needed. 06/05/18   [provider]  ipratropium-albuterol (DUONEB) 0.5-2.5 (3) MG/3ML SOLN Take 3 mLs by nebulization every 4 (four) hours as needed. 06/24/18   [name], MD  LORazepam (ATIVAN) 1 MG tablet Take 1 tablet (1 mg total) by mouth every 8 (eight) hours as needed for anxiety. 07/07/18   [name], MD  losartan (COZAAR) 25 MG tablet TAKE 1 TABLET DAILY 01/27/18   Minna Merritts, MD  mirtazapine (REMERON) 30 MG tablet Take 1 tablet (30 mg total) by mouth at bedtime. 06/24/18   [name], MD  Multiple Vitamin (MULTIVITAMIN) capsule Take 1 capsule by mouth daily.    [provider]  nitroGLYCERIN (NITROSTAT) 0.4 MG SL tablet Place 1 tablet (0.4 mg total) under the tongue every 5 (five)  minutes as needed. 10/17/17   Wellington Hampshire, MD  sertraline (ZOLOFT) 100 MG tablet Take 100 mg by mouth daily.  06/28/18   [provider]  SPIRIVA HANDIHALER 18 MCG inhalation capsule PLACE 1 CAPSULE INTO INHALER AND INHALE THE CONTENTS DAILY 07/26/18   Trinna Post, PA-C  sucralfate (CARAFATE) 1 GM/10ML suspension Take 10 mLs (1 g total) by mouth 4 (four) times daily -  with meals and at bedtime. Patient taking differently: Take 1 g by mouth 4 (four) times daily as needed.  05/16/18   [name], MD     Allergies Prozac [fluoxetine hcl]; Effexor xr [venlafaxine hcl er]; and Wellbutrin [bupropion]   Family History  Problem Relation Age of Onset  . Heart attack Brother  Brother #1  . Diabetes Brother   . Hypertension Brother        #3  . Coronary artery disease Father 56       deceased  . Heart attack Father   . Diabetes Father   . Heart disease Father   . COPD Mother 76       deceased  . Alcohol abuse Sister        polysubstance abuse  . COPD Sister   . Lung cancer Sister   . Alcohol abuse Sister        polysubstance abuse  . Penile cancer Brother   . Diabetes Brother     Social History Social History   Tobacco Use  . Smoking status: Current Every Day Smoker    Packs/day: 0.50    Years: 42.00    Pack years: 21.00    Types: Cigarettes  . Smokeless tobacco: Never Used  Substance Use Topics  . Alcohol use: No    Alcohol/week: 0.0 standard drinks  . Drug use: No    Review of Systems  Constitutional:   No fever or chills.  ENT:   No sore throat. No rhinorrhea. Cardiovascular:   No chest pain or syncope. Respiratory:   Positive shortness of breath and cough. Gastrointestinal:   Negative for abdominal pain, vomiting and diarrhea.  Musculoskeletal:   Negative for focal pain or swelling All other systems reviewed and are negative except as documented above in ROS and HPI.  ____________________________________________   PHYSICAL  EXAM:  VITAL SIGNS: ED Triage Vitals  Enc Vitals Group     BP 09/13/18 1235 112/73     Pulse Rate 09/13/18 1235 (!) 105     Resp 09/13/18 1235 (!) 22     Temp 09/13/18 1235 97.9 F (36.6 C)     Temp Source 09/13/18 1235 Oral     SpO2 09/13/18 1235 98 %     Weight 09/13/18 1236 149 lb (67.6 kg)     Height 09/13/18 1236 5\' 10"  (1.778 m)     Head Circumference --      Peak Flow --      Pain Score 09/13/18 1236 0     Pain Loc --      Pain Edu? --      Excl. in [place]? --     Vital signs reviewed, nursing assessments reviewed.   Constitutional:   Alert and oriented. Non-toxic appearance. Eyes:   Conjunctivae are normal. EOMI. PERRL. ENT      Head:   Normocephalic and atraumatic.      Nose:   No congestion/rhinnorhea.       Mouth/Throat:   Dry mucous membranes, no pharyngeal erythema. No peritonsillar mass.       Neck:   No meningismus. Full ROM. Hematological/Lymphatic/Immunilogical:   No cervical lymphadenopathy. Cardiovascular:   Tachycardia heart rate 105. Symmetric bilateral radial and DP pulses.  No murmurs. Cap refill less than 2 seconds. Respiratory:   Tachypnea.  Diffuse expiratory wheezing.  Prolonged expiratory phase.  Diminished breath sounds in all lung fields Gastrointestinal:   Soft and nontender. Non distended. There is no CVA tenderness.  No rebound, rigidity, or guarding. Musculoskeletal:   Normal range of motion in all extremities. No joint effusions.  No lower extremity tenderness.  No edema. Neurologic:   Normal speech and language.  Motor grossly intact. No acute focal neurologic deficits are appreciated.  Skin:    Skin is warm, dry and intact. No rash noted.  No petechiae, purpura, or bullae.  ____________________________________________    LABS (pertinent positives/negatives) (all labs ordered are listed, but only abnormal results are displayed) Labs Reviewed  BASIC METABOLIC PANEL - Abnormal; Notable for the following components:      Result Value    Chloride 96 (*)    Glucose, Bld 178 (*)    All other components within normal limits  BLOOD GAS, VENOUS - Abnormal; Notable for the following components:   pCO2, Ven 87 (*)    All other components within normal limits  CBC   ____________________________________________   EKG  Interpreted by me Sinus tachycardia rate 102, left axis, normal intervals.  Poor R wave progression.  Normal ST segments and T waves.  ____________________________________________    [id number]  Dg Chest 2 View  Result Date: 09/13/2018 CLINICAL DATA:  Shortness of breath and cough EXAM: CHEST - 2 VIEW COMPARISON:  Chest CT June 01, 2018; chest radiograph August 24, 2018 FINDINGS: There is a 1 cm nodular opacity in the left upper lobe, stable from prior CT and recent chest radiograph. There are scattered areas of scarring in both lungs with underlying emphysematous change, better delineated on prior CT. There is no edema or consolidation. The heart size is normal. Pulmonary vascularity is stable and reflects underlying emphysematous change. No evident adenopathy. There is aortic atherosclerosis. No bone lesions. IMPRESSION: Underlying emphysematous changes. Scattered areas of scarring. 1 cm pulmonary nodular lesion left upper lobe, stable. No edema or consolidation. Stable cardiac silhouette.  There is aortic atherosclerosis. Aortic Atherosclerosis (ICD10-I70.0) and Emphysema (ICD10-J43.9). Electronically Signed   By: Lowella Grip III M.D.   On: 09/13/2018 13:26   Ct Angio Chest Pe W And/or Wo Contrast  Result Date: 09/13/2018 CLINICAL DATA:  67 year old with 5 day history of shortness of breath and nonproductive cough. Patient was seen at an urgent care on Saturday and was started on antibiotic therapy and corticosteroids without interval improvement in symptoms. EXAM: CT ANGIOGRAPHY CHEST WITH CONTRAST TECHNIQUE: Multidetector CT imaging of the chest was performed using the standard protocol during bolus  administration of intravenous contrast. Multiplanar CT image reconstructions and MIPs were obtained to evaluate the vascular anatomy. CONTRAST:  51mL OMNIPAQUE IOHEXOL 350 MG/ML IV. COMPARISON:  CT/CTA chest 06/01/2018, 04/27/2018, 02/01/2018 and earlier. PET-CT 10/13/2017. FINDINGS: Cardiovascular: Contrast opacification of pulmonary arteries is excellent. No filling defects within either main pulmonary artery or their segmental branches in either lung to suggest pulmonary embolism. Normal heart size with likely LEFT ventricular hypertrophy. No significant pericardial effusion. RIGHT coronary artery stent. Moderate LAD coronary atherosclerosis. Moderate atherosclerosis involving the thoracic and proximal abdominal aorta without evidence of aneurysm or dissection. Proximal great vessels widely patent. Celiac artery and SUPERIOR mesenteric artery widely patent at their origins. Mediastinum/Nodes: No pathologically enlarged mediastinal, hilar or axillary lymph nodes. No mediastinal masses. Normal-appearing esophagus. Normal-appearing thyroid gland. Lungs/Pleura: Severe emphysematous changes throughout both lungs as noted previously. In the POSTERIOR LEFT UPPER LOBE there is a 10 x 17 mm nodule (14 mm mean diameter) on image 31 of series 6. Even though this nodule was hypermetabolic on the prior PET-CT, it has not significantly changed in size dating back to October, 2018. In the RIGHT UPPER LOBE there is a 10 x 15 mm nodule (13 mm mean diameter) on image 31 of series 6. This nodule is associated with linear scarring and has decreased in size since October, 2018. It demonstrated only low level metabolic activity on the prior PET-CT. In the SUPERIOR segment LEFT LOWER  LOBE there is a 3 x 7 mm nodule (5 mm mean diameter) on image 47 of series 6. This nodule is unchanged dating back to the October, 2018 CT. A 3 mm nodule in the LEFT lung apex is associated with parenchymal scarring and is also unchanged dating back to the  October, 2018 CT. No new or enlarging nodules in either lung. Scattered areas of parenchymal scarring in both lungs. No confluent airspace consolidation. No pleural effusions. No ground-glass lung opacities. Upper Abdomen: Stable 2.9 x 1.3 cm adenoma involving the RIGHT adrenal gland (Hounsfield measurement -15) and stable 3.2 x 2.7 cm adenoma involving the LEFT adrenal gland (Hounsfield measurement 0). Upper abdomen otherwise unremarkable for the early arterial phase of enhancement. 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 No acute findings. Review of the MIP images confirms the above findings. IMPRESSION: 1. No evidence of pulmonary embolism. 2.  No acute cardiopulmonary disease. 3. BILATERAL lung nodules as described above, all of which are either stable or decreased in size since October, 2018. (This despite the fact that the LEFT UPPER LOBE lung nodule was hypermetabolic on prior PET-CT). No new or enlarging lung nodules. 4. Stable BILATERAL adrenal adenomas Aortic Atherosclerosis (ICD10-I70.0) and Emphysema (ICD10-J43.9). Electronically Signed   By: Evangeline Dakin M.D.   On: 09/13/2018 17:48    ____________________________________________   PROCEDURES Procedures  ____________________________________________  DIFFERENTIAL DIAGNOSIS   COPD exacerbation, pneumonia, pneumothorax, pulmonary embolism  CLINICAL IMPRESSION / ASSESSMENT AND PLAN / ED COURSE  Pertinent labs & imaging results that were available during my care of the patient were reviewed by me and considered in my medical decision making (see chart for details).    Patient presents with shortness of breath, increased oxygen requirement, tachypnea, tachycardia, and the setting of recent hospitalization.  Been on steroids and bronchodilators for the past 4 days without improvement.  Worsened hypoxic respiratory failure, acute on chronic.  Will need to obtain CT angiogram chest to rule  out PE while giving DuoNeb albuterol Solu-Medrol magnesium for COPD exacerbation.  Clinical Course as of Sep 13 1933  Wed Sep 13, 2018  1700 Chronic compensated respiratory acidosis.  pCO2, Ven(!!): 87 [PS]    Clinical Course User Index [PS] Carrie Mew, MD     ----------------------------------------- 7:33 PM on 09/13/2018 -----------------------------------------  CT Joetta Manners negative.  Patient still short of breath with prolonged expiratory phase and wheezing.  Case discussed with the hospitalist for further management.  ____________________________________________   FINAL CLINICAL IMPRESSION(S) / ED DIAGNOSES    Final diagnoses:  COPD exacerbation ([place])  Acute on chronic respiratory failure with hypoxia and hypercapnia [facility])     ED Discharge Orders    None      Portions of this note were generated with dragon dictation software. Dictation errors may occur despite best attempts at proofreading.    Carrie Mew, MD 09/13/18 [phone]

## 2018-09-13 NOTE — ED Notes (Signed)
Pt provided ginerale per request.

## 2018-09-13 NOTE — ED Notes (Signed)
Date and time results received: 09/13/18 1657 (use smartphrase ".now" to insert current time)  Test: pCO2 Critical Value: 42  Name of Provider Notified: MD Joni Fears  Orders Received? Or Actions Taken?: no orders received

## 2018-09-13 NOTE — Progress Notes (Signed)
Patient ID: Leslie Sims, male   DOB: 1951/01/01, 67 y.o.   MRN: [id number]  ACP note  Patient present  Diagnosis: Acute hypoxic and hypercarbic respiratory failure, COPD exacerbation, history of CAD, depression anxiety, hyperlipidemia  CODE STATUS discussed.  Patient wishes to be a full code.  Plan.  Treat acute on chronic hypercarbic and hypoxic respiratory failure with BiPAP at night and oxygen supplementation.  For COPD exacerbation give Solu-Medrol and nebulizer treatments  Time spent on ACP discussion 17 minutes Dr. Loletha Grayer

## 2018-09-13 NOTE — ED Triage Notes (Signed)
Patient reports shortness of breath since Friday. Reports he went to [facility] Saturday and was started on antibiotic and steroids. Denies improvement of symptoms. Reports non-productive cough. Denies fever.

## 2018-09-13 NOTE — ED Notes (Addendum)
Patient transported to CT 

## 2018-09-14 ENCOUNTER — Ambulatory Visit: Payer: BLUE CROSS/BLUE SHIELD | Admitting: Psychology

## 2018-09-14 LAB — RESPIRATORY PANEL BY PCR
Adenovirus: NOT DETECTED
Bordetella pertussis: NOT DETECTED
CHLAMYDOPHILA PNEUMONIAE-RVPPCR: NOT DETECTED
Coronavirus 229E: NOT DETECTED
Coronavirus HKU1: NOT DETECTED
Coronavirus NL63: NOT DETECTED
Coronavirus OC43: NOT DETECTED
Influenza A: NOT DETECTED
Influenza B: NOT DETECTED
METAPNEUMOVIRUS-RVPPCR: NOT DETECTED
Mycoplasma pneumoniae: NOT DETECTED
PARAINFLUENZA VIRUS 2-RVPPCR: NOT DETECTED
PARAINFLUENZA VIRUS 3-RVPPCR: NOT DETECTED
Parainfluenza Virus 1: NOT DETECTED
Parainfluenza Virus 4: NOT DETECTED
RHINOVIRUS / ENTEROVIRUS - RVPPCR: NOT DETECTED
Respiratory Syncytial Virus: NOT DETECTED

## 2018-09-14 LAB — CBC
HCT: 39.4 % — ABNORMAL LOW (ref 40.0–52.0)
Hemoglobin: 13.2 g/dL (ref 13.0–18.0)
MCH: 30.9 pg (ref 26.0–34.0)
MCHC: 33.6 g/dL (ref 32.0–36.0)
MCV: 91.9 fL (ref 80.0–100.0)
PLATELETS: 228 10*3/uL (ref 150–440)
RBC: 4.28 MIL/uL — AB (ref 4.40–5.90)
RDW: 13 % (ref 11.5–14.5)
WBC: 6.5 10*3/uL (ref 3.8–10.6)

## 2018-09-14 LAB — BASIC METABOLIC PANEL
Anion gap: 6 (ref 5–15)
BUN: 11 mg/dL (ref 8–23)
CALCIUM: 8.8 mg/dL — AB (ref 8.9–10.3)
CO2: 34 mmol/L — ABNORMAL HIGH (ref 22–32)
CREATININE: 0.67 mg/dL (ref 0.61–1.24)
Chloride: 101 mmol/L (ref 98–111)
GFR calc non Af Amer: >60 mL/min (ref >60)
Glucose, Bld: 142 mg/dL — ABNORMAL HIGH (ref 70–99)
Potassium: 4.2 mmol/L (ref 3.5–5.1)
SODIUM: 141 mmol/L (ref 135–145)

## 2018-09-14 MED ORDER — PREDNISONE 50 MG PO TABS
50.0000 mg | ORAL_TABLET | Freq: Every day | ORAL | Status: DC
  Administered 2018-09-15: 50 mg via ORAL
  Filled 2018-09-14: qty 1

## 2018-09-14 MED ORDER — METHYLPREDNISOLONE SODIUM SUCC 40 MG IJ SOLR
40.0000 mg | Freq: Two times a day (BID) | INTRAMUSCULAR | Status: AC
  Administered 2018-09-14: 40 mg via INTRAVENOUS
  Filled 2018-09-14: qty 1

## 2018-09-14 NOTE — Progress Notes (Signed)
[facility] NAME: Edsel Shives    MR#:  [id number]  DATE OF BIRTH:  [birth year]  SUBJECTIVE:  CHIEF COMPLAINT:   Chief Complaint  Patient presents with  . Shortness of Breath   Better cough and shortness of breath, oxygen 2 L by nasal cannula. REVIEW OF SYSTEMS:  Review of Systems  Constitutional: Negative for chills, fever and malaise/fatigue.  HENT: Negative for sore throat.   Eyes: Negative for blurred vision and double vision.  Respiratory: Positive for cough and shortness of breath. Negative for hemoptysis, sputum production, wheezing and stridor.   Cardiovascular: Negative for chest pain, palpitations, orthopnea and leg swelling.  Gastrointestinal: Negative for abdominal pain, blood in stool, diarrhea, melena, nausea and vomiting.  Genitourinary: Negative for dysuria, flank pain and hematuria.  Musculoskeletal: Negative for back pain and joint pain.  Skin: Negative for rash.  Neurological: Negative for dizziness, sensory change, focal weakness, seizures, loss of consciousness, weakness and headaches.  Endo/Heme/Allergies: Negative for polydipsia.  Psychiatric/Behavioral: Negative for depression. The patient is not nervous/anxious.     DRUG ALLERGIES:   Allergies  Allergen Reactions  . Prozac [Fluoxetine Hcl] Shortness Of Breath  . Effexor Xr [Venlafaxine Hcl Er] Other (See Comments)    "Makes me feel funny"  . Wellbutrin [Bupropion] Other (See Comments)    "Makes me feel funny"   VITALS:  Blood pressure 128/60, pulse 83, temperature (!) 97.5 F (36.4 C), temperature source Oral, resp. rate 16, height 5\' 10"  (1.778 m), weight 65.5 kg, SpO2 99 %. PHYSICAL EXAMINATION:  Physical Exam  Constitutional: He is oriented to person, place, and time. No distress.  HENT:  Head: Normocephalic.  Mouth/Throat: Oropharynx is clear and moist.  Eyes: Pupils are equal, round, and reactive to light. Conjunctivae and EOM are  normal. No scleral icterus.  Neck: Normal range of motion. Neck supple. No JVD present. No tracheal deviation present.  Cardiovascular: Normal rate, regular rhythm and normal heart sounds. Exam reveals no gallop.  No murmur heard. Pulmonary/Chest: Effort normal. No respiratory distress. He has wheezes. He has no rales.  Diminished lung sounds  Abdominal: Soft. Bowel sounds are normal. He exhibits no distension. There is no tenderness. There is no rebound.  Musculoskeletal: Normal range of motion. He exhibits no edema or tenderness.  Neurological: He is alert and oriented to person, place, and time. No cranial nerve deficit.  Skin: No rash noted. No erythema.  Psychiatric: He has a normal mood and affect.   LABORATORY PANEL:  Male CBC Recent Labs  Lab 09/14/18 0302  WBC 6.5  HGB 13.2  HCT 39.4*  PLT 228   ------------------------------------------------------------------------------------------------------------------ Chemistries  Recent Labs  Lab 09/14/18 0302  NA 141  K 4.2  CL 101  CO2 34*  GLUCOSE 142*  BUN 11  CREATININE 0.67  CALCIUM 8.8*   RADIOLOGY:  Ct Angio Chest Pe W And/or Wo Contrast  Result Date: 09/13/2018 CLINICAL DATA:  67 year old with 5 day history of shortness of breath and nonproductive cough. Patient was seen at an urgent care on Saturday and was started on antibiotic therapy and corticosteroids without interval improvement in symptoms. EXAM: CT ANGIOGRAPHY CHEST WITH CONTRAST TECHNIQUE: Multidetector CT imaging of the chest was performed using the standard protocol during bolus administration of intravenous contrast. Multiplanar CT image reconstructions and MIPs were obtained to evaluate the vascular anatomy. CONTRAST:  52mL OMNIPAQUE IOHEXOL 350 MG/ML IV. COMPARISON:  CT/CTA chest 06/01/2018, 04/27/2018, 02/01/2018 and earlier. PET-CT  10/13/2017. FINDINGS: Cardiovascular: Contrast opacification of pulmonary arteries is excellent. No filling defects  within either main pulmonary artery or their segmental branches in either lung to suggest pulmonary embolism. Normal heart size with likely LEFT ventricular hypertrophy. No significant pericardial effusion. RIGHT coronary artery stent. Moderate LAD coronary atherosclerosis. Moderate atherosclerosis involving the thoracic and proximal abdominal aorta without evidence of aneurysm or dissection. Proximal great vessels widely patent. Celiac artery and SUPERIOR mesenteric artery widely patent at their origins. Mediastinum/Nodes: No pathologically enlarged mediastinal, hilar or axillary lymph nodes. No mediastinal masses. Normal-appearing esophagus. Normal-appearing thyroid gland. Lungs/Pleura: Severe emphysematous changes throughout both lungs as noted previously. In the POSTERIOR LEFT UPPER LOBE there is a 10 x 17 mm nodule (14 mm mean diameter) on image 31 of series 6. Even though this nodule was hypermetabolic on the prior PET-CT, it has not significantly changed in size dating back to October, 2018. In the RIGHT UPPER LOBE there is a 10 x 15 mm nodule (13 mm mean diameter) on image 31 of series 6. This nodule is associated with linear scarring and has decreased in size since October, 2018. It demonstrated only low level metabolic activity on the prior PET-CT. In the SUPERIOR segment LEFT LOWER LOBE there is a 3 x 7 mm nodule (5 mm mean diameter) on image 47 of series 6. This nodule is unchanged dating back to the October, 2018 CT. A 3 mm nodule in the LEFT lung apex is associated with parenchymal scarring and is also unchanged dating back to the October, 2018 CT. No new or enlarging nodules in either lung. Scattered areas of parenchymal scarring in both lungs. No confluent airspace consolidation. No pleural effusions. No ground-glass lung opacities. Upper Abdomen: Stable 2.9 x 1.3 cm adenoma involving the RIGHT adrenal gland (Hounsfield measurement -15) and stable 3.2 x 2.7 cm adenoma involving the LEFT adrenal gland  (Hounsfield measurement 0). Upper abdomen otherwise unremarkable for the early arterial phase of enhancement. 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 No acute findings. Review of the MIP images confirms the above findings. IMPRESSION: 1. No evidence of pulmonary embolism. 2.  No acute cardiopulmonary disease. 3. BILATERAL lung nodules as described above, all of which are either stable or decreased in size since October, 2018. (This despite the fact that the LEFT UPPER LOBE lung nodule was hypermetabolic on prior PET-CT). No new or enlarging lung nodules. 4. Stable BILATERAL adrenal adenomas Aortic Atherosclerosis (ICD10-I70.0) and Emphysema (ICD10-J43.9). Electronically Signed   By: Evangeline Dakin M.D.   On: 09/13/2018 17:48   ASSESSMENT AND PLAN:   1.  Acute hypoxic and hypercapnic respiratory failure.  PCO2 87 on venous blood gas.   He was on BiPAP at night. On O2 [place]    2.  COPD exacerbation. On Solu-Medrol 40 mg IV twice daily.  change to prednisone tomorrow. Nebulizer treatments with budesonide and DuoNeb. 3.  History of CAD on aspirin and Lipitor 4.  Depression on Zoloft 5.  Hyperlipidemia unspecified on Lipitor 6.  Anxiety depression on Ativan and Abilify and Remeron and Zoloft 7.  Tobacco abuse.  Smoking cessation counseling. On nicotine patch.  All the records are reviewed and case discussed with Care Management/Social Worker. Management plans discussed with the patient, his brother and they are in agreement.  CODE STATUS: Full Code  TOTAL TIME TAKING CARE OF THIS PATIENT: 32 minutes.   More than 50% of the time was spent in counseling/coordination of care: YES  POSSIBLE D/C IN  1-2 DAYS, DEPENDING ON CLINICAL CONDITION.   Demetrios Loll M.D on 09/14/2018 at 3:47 PM  Between 7am to 6pm - Pager - [phone]  After 6pm go to [email or web address] - [facility]

## 2018-09-14 NOTE — Progress Notes (Signed)
Patient transferring to room 142, talked to [place], Therapist, sports and called report. Called daughter Particia Nearing and updated her about the room change. Packed belongings and will transfer when room is clean. RN will continue to monitor.

## 2018-09-14 NOTE — Progress Notes (Signed)
Patient has no acute event overnight. He was placed on BiPAP overnight, and tolerated it well. Remained in NSR with stable VS.

## 2018-09-14 NOTE — Progress Notes (Signed)
Pt's respiratory panel is negative. RN will discontinue droplet precautions. I will continue to assess.

## 2018-09-15 MED ORDER — PREDNISONE 20 MG PO TABS: 40.0000 mg | ORAL_TABLET | Freq: Every day | ORAL | 0 refills | Status: DC

## 2018-09-15 NOTE — Care Management Note (Signed)
Case Management Note  Patient Details  Name: Leslie Sims MRN: [id number] Date of Birth: 09-29-1951  Subjective/Objective:  Met with patient at bedside to discuss discharge planning. Offered to set up home health RN. Patient declined. He states he checks his  sats regularly through out the day and he doesn't feel like he needs a nurse coming out at this point. Will close case. Notified PCP.                   Action/Plan:   Expected Discharge Date:  09/15/18               Expected Discharge Plan:  Home/Self Care  In-House Referral:     Discharge planning Services  CM Consult  Post Acute Care Choice:  Home Health Choice offered to:  Patient  DME Arranged:    DME Agency:     HH Arranged:  Patient Refused [place] Agency:     Status of Service:  Completed, signed off  If discussed at [employer] of Stay Meetings, dates discussed:    Additional Comments:  Jolly Mango, RN 09/15/2018, 9:46 AM

## 2018-09-15 NOTE — Discharge Planning (Signed)
Patient and Daughter educated about proper O2 and neb usage at home.  Discussed using pule ox to check if O2 is needed, taking xanax if time or other ways to calm his nerves.  Patient did not realize that O2 was a medication and admitted to applying and increasing his dosage as he got more anxious.  Via teachback, patient agreed to check oxygen levels with pulse ox and only apply oxygen if under 90%.  Also agreed to have [facility] RN come out for few weeks to help monitor condition.

## 2018-09-15 NOTE — Discharge Planning (Signed)
Patient IV removed. RN assessment and VS revealed stability for DC to home with [facility].  Discharge papers given, explained and educated. In formed of suggested FU appt and appt made.  Scripts given. Once ready, wheeled to front and friend transporting home via car.

## 2018-09-15 NOTE — Discharge Summary (Signed)
[facility] NAME: Leslie Sims    MR#:  [id number]  DATE OF BIRTH:  May 23, 1951  DATE OF ADMISSION:  09/13/2018   ADMITTING PHYSICIAN: Loletha Grayer, MD  DATE OF DISCHARGE: 09/15/2018  2:13 PM  PRIMARY CARE PHYSICIAN: [name], MD   ADMISSION DIAGNOSIS:  COPD exacerbation ([place]) [J44.1] Acute on chronic respiratory failure with hypoxia and hypercapnia (HCC) [J96.21, J96.22] DISCHARGE DIAGNOSIS:  Active Problems:   Acute respiratory failure with hypoxia and hypercapnia (H[facility])  SECONDARY DIAGNOSIS:   Past Medical History:  Diagnosis Date  . Allergic rhinitis   . Anxiety   . Back pain   . Colon polyps 08/2012   colonoscopy; multiple colon polyps; repeat colonoscopy in one year.  [name] COPD (chronic obstructive pulmonary disease) ([place])   . Coronary artery disease 11/2009   a. late presenting ant MI; b. LHC 100% pLAD s/p PCI/DES, 99% mRCA s/p PCI/DES, EF 35%; c. nuclear stress test 05/13: prior ant/inf infarcts w/o ischemia, EF 43%; d. LHC 02/14: widely patent stents with no other obs dz, EF 40%; e. LHC 11/17: LM nl, mLAD 10%, patent LAD stent, dLAD 20%, p-mRCA 10%, mRCA 40%, patent RCA stent, EF 35-45%   . Depression   . GERD (gastroesophageal reflux disease)   . Headache(784.0)   . Helicobacter pylori (H. pylori)   . Hemorrhoids   . Hernia    inguinal  . Hyperlipidemia   . Hypertension   . Insomnia   . Ischemic cardiomyopathy   . Myalgia   . Peptic ulcer   . Pulmonary nodules   . ST elevation (STEMI) myocardial infarction involving left anterior descending coronary artery ([place]) 11/2009   a. s/p PCI to the LAD  . Status post dilation of esophageal narrowing 2000  . Tinea pedis   . Wears dentures    partial upper   HOSPITAL COURSE:  1. Acute hypoxic and hypercapnic respiratory failure. PCO2 87 on venous blood gas.  He was on BiPAP at night. On O2 [place], off O2 [place] today. He has O2 [place] and prn daily.    2.  COPD exacerbation. He was on Solu-Medrol 40 mg IV twice daily. changed to prednisone. Nebulizer treatments with budesonide and DuoNeb. 3. History of CAD on aspirin and Lipitor 4. Depression on Zoloft 5. Hyperlipidemia unspecified on Lipitor 6. Anxiety depression on Ativan and Abilify and Remeron and Zoloft 7. Tobacco abuse. Smoking cessation counseling. On nicotine patch. DISCHARGE CONDITIONS:  Stable, discharged to home today. CONSULTS OBTAINED:   DRUG ALLERGIES:   Allergies  Allergen Reactions  . Prozac [Fluoxetine Hcl] Shortness Of Breath  . Effexor Xr [Venlafaxine Hcl Er] Other (See Comments)    "Makes me feel funny"  . Wellbutrin [Bupropion] Other (See Comments)    "Makes me feel funny"   DISCHARGE MEDICATIONS:   Allergies as of 09/15/2018      Reactions   Prozac [fluoxetine Hcl] Shortness Of Breath   Effexor Xr [venlafaxine Hcl Er] Other (See Comments)   "Makes me feel funny"   Wellbutrin [bupropion] Other (See Comments)   "Makes me feel funny"      Medication List    TAKE these medications   acetaminophen 325 MG tablet Commonly known as:  TYLENOL Take 2 tablets (650 mg total) by mouth every 6 (six) hours as needed for mild pain (or Fever >/= 101).   albuterol 108 (90 Base) MCG/ACT inhaler Commonly known as:  PROVENTIL HFA;VENTOLIN HFA USE 2 INHALATIONS  EVERY 6 HOURS AS NEEDED FOR WHEEZING   ARIPiprazole 5 MG tablet Commonly known as:  ABILIFY Take 5 mg by mouth daily.   aspirin 81 MG tablet Take 81 mg by mouth daily.   atorvastatin 40 MG tablet Commonly known as:  LIPITOR Take 1 tablet (40 mg total) by mouth daily.   Fluticasone-Salmeterol 500-50 MCG/DOSE Aepb Commonly known as:  ADVAIR Inhale 1 puff into the lungs 2 (two) times daily.   furosemide 20 MG tablet Commonly known as:  LASIX Take 1 tablet (20 mg total) by mouth daily.   hydrOXYzine 25 MG capsule Commonly known as:  VISTARIL Take 25 mg by mouth 2 (two) times daily as needed.     ipratropium-albuterol 0.5-2.5 (3) MG/3ML Soln Commonly known as:  DUONEB Take 3 mLs by nebulization every 4 (four) hours as needed.   LORazepam 1 MG tablet Commonly known as:  ATIVAN Take 1 tablet (1 mg total) by mouth every 8 (eight) hours as needed for anxiety.   losartan 25 MG tablet Commonly known as:  COZAAR TAKE 1 TABLET DAILY   mirtazapine 30 MG tablet Commonly known as:  REMERON Take 1 tablet (30 mg total) by mouth at bedtime.   multivitamin capsule Take 1 capsule by mouth daily.   nitroGLYCERIN 0.4 MG SL tablet Commonly known as:  NITROSTAT Place 1 tablet (0.4 mg total) under the tongue every 5 (five) minutes as needed.   predniSONE 20 MG tablet Commonly known as:  DELTASONE Take 2 tablets (40 mg total) by mouth daily with breakfast.   sertraline 100 MG tablet Commonly known as:  ZOLOFT Take 100 mg by mouth daily.   SPIRIVA HANDIHALER 18 MCG inhalation capsule Generic drug:  tiotropium PLACE 1 CAPSULE INTO INHALER AND INHALE THE CONTENTS DAILY   sucralfate 1 GM/10ML suspension Commonly known as:  CARAFATE Take 10 mLs (1 g total) by mouth 4 (four) times daily -  with meals and at bedtime. What changed:    when to take this  reasons to take this        DISCHARGE INSTRUCTIONS:  See AVS. If you experience worsening of your admission symptoms, develop shortness of breath, life threatening emergency, suicidal or homicidal thoughts you must seek medical attention immediately by calling 911 or calling your MD immediately  if symptoms less severe.  You Must read complete instructions/literature along with all the possible adverse reactions/side effects for all the Medicines you take and that have been prescribed to you. Take any new Medicines after you have completely understood and accpet all the possible adverse reactions/side effects.   Please note  You were cared for by a hospitalist during your hospital stay. If you have any questions about your  discharge medications or the care you received while you were in the hospital after you are discharged, you can call the unit and asked to speak with the hospitalist on call if the hospitalist that took care of you is not available. Once you are discharged, your primary care physician will handle any further medical issues. Please note that NO REFILLS for any discharge medications will be authorized once you are discharged, as it is imperative that you return to your primary care physician (or establish a relationship with a primary care physician if you do not have one) for your aftercare needs so that they can reassess your need for medications and monitor your lab values.    On the day of Discharge:  VITAL SIGNS:  Blood pressure 121/80, pulse  74, temperature 98 F (36.7 C), temperature source Oral, resp. rate 18, height 5\' 10"  (1.778 m), weight 64.4 kg, SpO2 95 %. PHYSICAL EXAMINATION:  GENERAL:  67 y.o.-year-old patient lying in the bed with no acute distress.  EYES: Pupils equal, round, reactive to light and accommodation. No scleral icterus. Extraocular muscles intact.  HEENT: Head atraumatic, normocephalic. Oropharynx and nasopharynx clear.  NECK:  Supple, no jugular venous distention. No thyroid enlargement, no tenderness.  LUNGS: Normal breath sounds bilaterally, no wheezing, rales,rhonchi or crepitation. No use of accessory muscles of respiration.  CARDIOVASCULAR: S1, S2 normal. No murmurs, rubs, or gallops.  ABDOMEN: Soft, non-tender, non-distended. Bowel sounds present. No organomegaly or mass.  EXTREMITIES: No pedal edema, cyanosis, or clubbing.  NEUROLOGIC: Cranial nerves II through XII are intact. Muscle strength 5/5 in all extremities. Sensation intact. Gait not checked.  PSYCHIATRIC: The patient is alert and oriented x 3.  SKIN: No obvious rash, lesion, or ulcer.  DATA REVIEW:   CBC Recent Labs  Lab 09/14/18 0302  WBC 6.5  HGB 13.2  HCT 39.4*  PLT 228    Chemistries   Recent Labs  Lab 09/14/18 0302  NA 141  K 4.2  CL 101  CO2 34*  GLUCOSE 142*  BUN 11  CREATININE 0.67  CALCIUM 8.8*     Microbiology Results  Results for orders placed or performed during the hospital encounter of 09/13/18  Respiratory Panel by PCR     Status: None   Collection Time: 09/13/18 10:20 PM  Result Value Ref Range Status   Adenovirus NOT DETECTED NOT DETECTED Final   Coronavirus 229E NOT DETECTED NOT DETECTED Final   Coronavirus HKU1 NOT DETECTED NOT DETECTED Final   Coronavirus NL63 NOT DETECTED NOT DETECTED Final   Coronavirus OC43 NOT DETECTED NOT DETECTED Final   Metapneumovirus NOT DETECTED NOT DETECTED Final   Rhinovirus / Enterovirus NOT DETECTED NOT DETECTED Final   Influenza A NOT DETECTED NOT DETECTED Final   Influenza B NOT DETECTED NOT DETECTED Final   Parainfluenza Virus 1 NOT DETECTED NOT DETECTED Final   Parainfluenza Virus 2 NOT DETECTED NOT DETECTED Final   Parainfluenza Virus 3 NOT DETECTED NOT DETECTED Final   Parainfluenza Virus 4 NOT DETECTED NOT DETECTED Final   Respiratory Syncytial Virus NOT DETECTED NOT DETECTED Final   Bordetella pertussis NOT DETECTED NOT DETECTED Final   Chlamydophila pneumoniae NOT DETECTED NOT DETECTED Final   Mycoplasma pneumoniae NOT DETECTED NOT DETECTED Final    Comment: Performed at [facility], [place] [address] [place], [place] [address]    RADIOLOGY:  No results found.   Management plans discussed with the patient, family and they are in agreement.  CODE STATUS: Full Code   TOTAL TIME TAKING CARE OF THIS PATIENT: 32 minutes.    Demetrios Loll M.D on 09/15/2018 at 3:05 PM  Between 7am to 6pm - Pager - [phone]  After 6pm go to [email or web address] - Proofreader  [facility] [place] [facility]  Office  [phone]  CC: Primary care physician; [name], MD   Note: This dictation was prepared with Dragon dictation along with smaller phrase technology. Any transcriptional  errors that result from this process are unintentional.

## 2018-09-15 NOTE — Discharge Instructions (Signed)
Smoking cessation  

## 2018-09-19 LAB — BLOOD GAS, VENOUS
Patient temperature: 37
pCO2, Ven: 87 mmHg (ref 44.0–60.0)
pH, Ven: 7.3 (ref 7.250–7.430)

## 2018-09-20 ENCOUNTER — Telehealth: Payer: Self-pay

## 2018-09-20 NOTE — Telephone Encounter (Signed)
EMMI Follow-up: Noted on the report that the patient hadn't read his discharge papers yet.  Called Leslie Sims but got his voicemail so left him my contact information to call me at his convenience.

## 2018-09-26 ENCOUNTER — Telehealth: Payer: Self-pay

## 2018-09-26 NOTE — Telephone Encounter (Signed)
EMMI Follow-up: 2nd follow-up call:  [name] left a voice message for me on Monday, 10/7 but I was out of the office and returned his call today (10/8).  I left him another message with my contact information and to feel free to contact me if he had any concerns post discharge.

## 2018-09-29 ENCOUNTER — Other Ambulatory Visit: Payer: Self-pay

## 2018-09-29 ENCOUNTER — Encounter: Payer: Self-pay | Admitting: Emergency Medicine

## 2018-09-29 ENCOUNTER — Inpatient Hospital Stay
Admission: EM | Admit: 2018-09-29 | Discharge: 2018-10-01 | DRG: 190 | Disposition: A | Discharge status: Home / Self Care | Payer: Medicare Other | Attending: Internal Medicine | Admitting: Internal Medicine

## 2018-09-29 ENCOUNTER — Emergency Department: Payer: Medicare Other

## 2018-09-29 DIAGNOSIS — I2510 Atherosclerotic heart disease of native coronary artery without angina pectoris: Secondary | ICD-10-CM | POA: Diagnosis present

## 2018-09-29 DIAGNOSIS — E785 Hyperlipidemia, unspecified: Secondary | ICD-10-CM | POA: Diagnosis present

## 2018-09-29 DIAGNOSIS — I255 Ischemic cardiomyopathy: Secondary | ICD-10-CM | POA: Diagnosis present

## 2018-09-29 DIAGNOSIS — R911 Solitary pulmonary nodule: Secondary | ICD-10-CM | POA: Diagnosis present

## 2018-09-29 DIAGNOSIS — J9601 Acute respiratory failure with hypoxia: Secondary | ICD-10-CM | POA: Diagnosis present

## 2018-09-29 DIAGNOSIS — J9611 Chronic respiratory failure with hypoxia: Secondary | ICD-10-CM | POA: Diagnosis present

## 2018-09-29 DIAGNOSIS — Z825 Family history of asthma and other chronic lower respiratory diseases: Secondary | ICD-10-CM

## 2018-09-29 DIAGNOSIS — I252 Old myocardial infarction: Secondary | ICD-10-CM | POA: Diagnosis not present

## 2018-09-29 DIAGNOSIS — Z9981 Dependence on supplemental oxygen: Secondary | ICD-10-CM

## 2018-09-29 DIAGNOSIS — J441 Chronic obstructive pulmonary disease with (acute) exacerbation: Secondary | ICD-10-CM | POA: Diagnosis present

## 2018-09-29 DIAGNOSIS — Z8249 Family history of ischemic heart disease and other diseases of the circulatory system: Secondary | ICD-10-CM | POA: Diagnosis not present

## 2018-09-29 DIAGNOSIS — I5022 Chronic systolic (congestive) heart failure: Secondary | ICD-10-CM | POA: Diagnosis present

## 2018-09-29 DIAGNOSIS — I110 Hypertensive heart disease with heart failure: Secondary | ICD-10-CM | POA: Diagnosis present

## 2018-09-29 DIAGNOSIS — Z7951 Long term (current) use of inhaled steroids: Secondary | ICD-10-CM | POA: Diagnosis not present

## 2018-09-29 DIAGNOSIS — F419 Anxiety disorder, unspecified: Secondary | ICD-10-CM | POA: Diagnosis present

## 2018-09-29 DIAGNOSIS — J9621 Acute and chronic respiratory failure with hypoxia: Secondary | ICD-10-CM | POA: Diagnosis present

## 2018-09-29 DIAGNOSIS — Z801 Family history of malignant neoplasm of trachea, bronchus and lung: Secondary | ICD-10-CM

## 2018-09-29 DIAGNOSIS — Z833 Family history of diabetes mellitus: Secondary | ICD-10-CM | POA: Diagnosis not present

## 2018-09-29 DIAGNOSIS — Z955 Presence of coronary angioplasty implant and graft: Secondary | ICD-10-CM

## 2018-09-29 DIAGNOSIS — F329 Major depressive disorder, single episode, unspecified: Secondary | ICD-10-CM | POA: Diagnosis present

## 2018-09-29 DIAGNOSIS — K219 Gastro-esophageal reflux disease without esophagitis: Secondary | ICD-10-CM | POA: Diagnosis present

## 2018-09-29 DIAGNOSIS — F17210 Nicotine dependence, cigarettes, uncomplicated: Secondary | ICD-10-CM | POA: Diagnosis present

## 2018-09-29 DIAGNOSIS — J9622 Acute and chronic respiratory failure with hypercapnia: Secondary | ICD-10-CM | POA: Diagnosis present

## 2018-09-29 DIAGNOSIS — Z7982 Long term (current) use of aspirin: Secondary | ICD-10-CM

## 2018-09-29 LAB — CBC
HCT: 46.7 % (ref 39.0–52.0)
Hemoglobin: 15 g/dL (ref 13.0–17.0)
MCH: 30.1 pg (ref 26.0–34.0)
MCHC: 32.1 g/dL (ref 30.0–36.0)
MCV: 93.6 fL (ref 80.0–100.0)
PLATELETS: 250 10*3/uL (ref 150–400)
RBC: 4.99 MIL/uL (ref 4.22–5.81)
RDW: 11.9 % (ref 11.5–15.5)
WBC: 10.9 10*3/uL — AB (ref 4.0–10.5)
nRBC: 0 % (ref 0.0–0.2)

## 2018-09-29 LAB — BASIC METABOLIC PANEL
Anion gap: 6 (ref 5–15)
BUN: 9 mg/dL (ref 8–23)
CALCIUM: 9.4 mg/dL (ref 8.9–10.3)
CO2: 36 mmol/L — ABNORMAL HIGH (ref 22–32)
Chloride: 98 mmol/L (ref 98–111)
Creatinine, Ser: 0.73 mg/dL (ref 0.61–1.24)
GFR calc Af Amer: >60 mL/min (ref >60)
Glucose, Bld: 109 mg/dL — ABNORMAL HIGH (ref 70–99)
POTASSIUM: 3.9 mmol/L (ref 3.5–5.1)
SODIUM: 140 mmol/L (ref 135–145)

## 2018-09-29 LAB — MRSA PCR SCREENING: MRSA BY PCR: NEGATIVE

## 2018-09-29 LAB — GLUCOSE, CAPILLARY: Glucose-Capillary: 122 mg/dL — ABNORMAL HIGH (ref 70–99)

## 2018-09-29 LAB — TROPONIN I

## 2018-09-29 LAB — BRAIN NATRIURETIC PEPTIDE: B Natriuretic Peptide: 17 pg/mL (ref 0.0–100.0)

## 2018-09-29 LAB — TSH: TSH: 3.025 u[IU]/mL (ref 0.350–4.500)

## 2018-09-29 IMAGING — CR DG CHEST 2V
1 series · 2 of 2 positions shown · non-contrast
Comparison: [DATE] CT and CXR

CLINICAL DATA: Increasing dyspnea and cough x4 days

EXAM:
CHEST - 2 VIEW

[Series 1: w chest pa · 0.14mm/px · 2 of 2 slices shown]
[im 1/2]
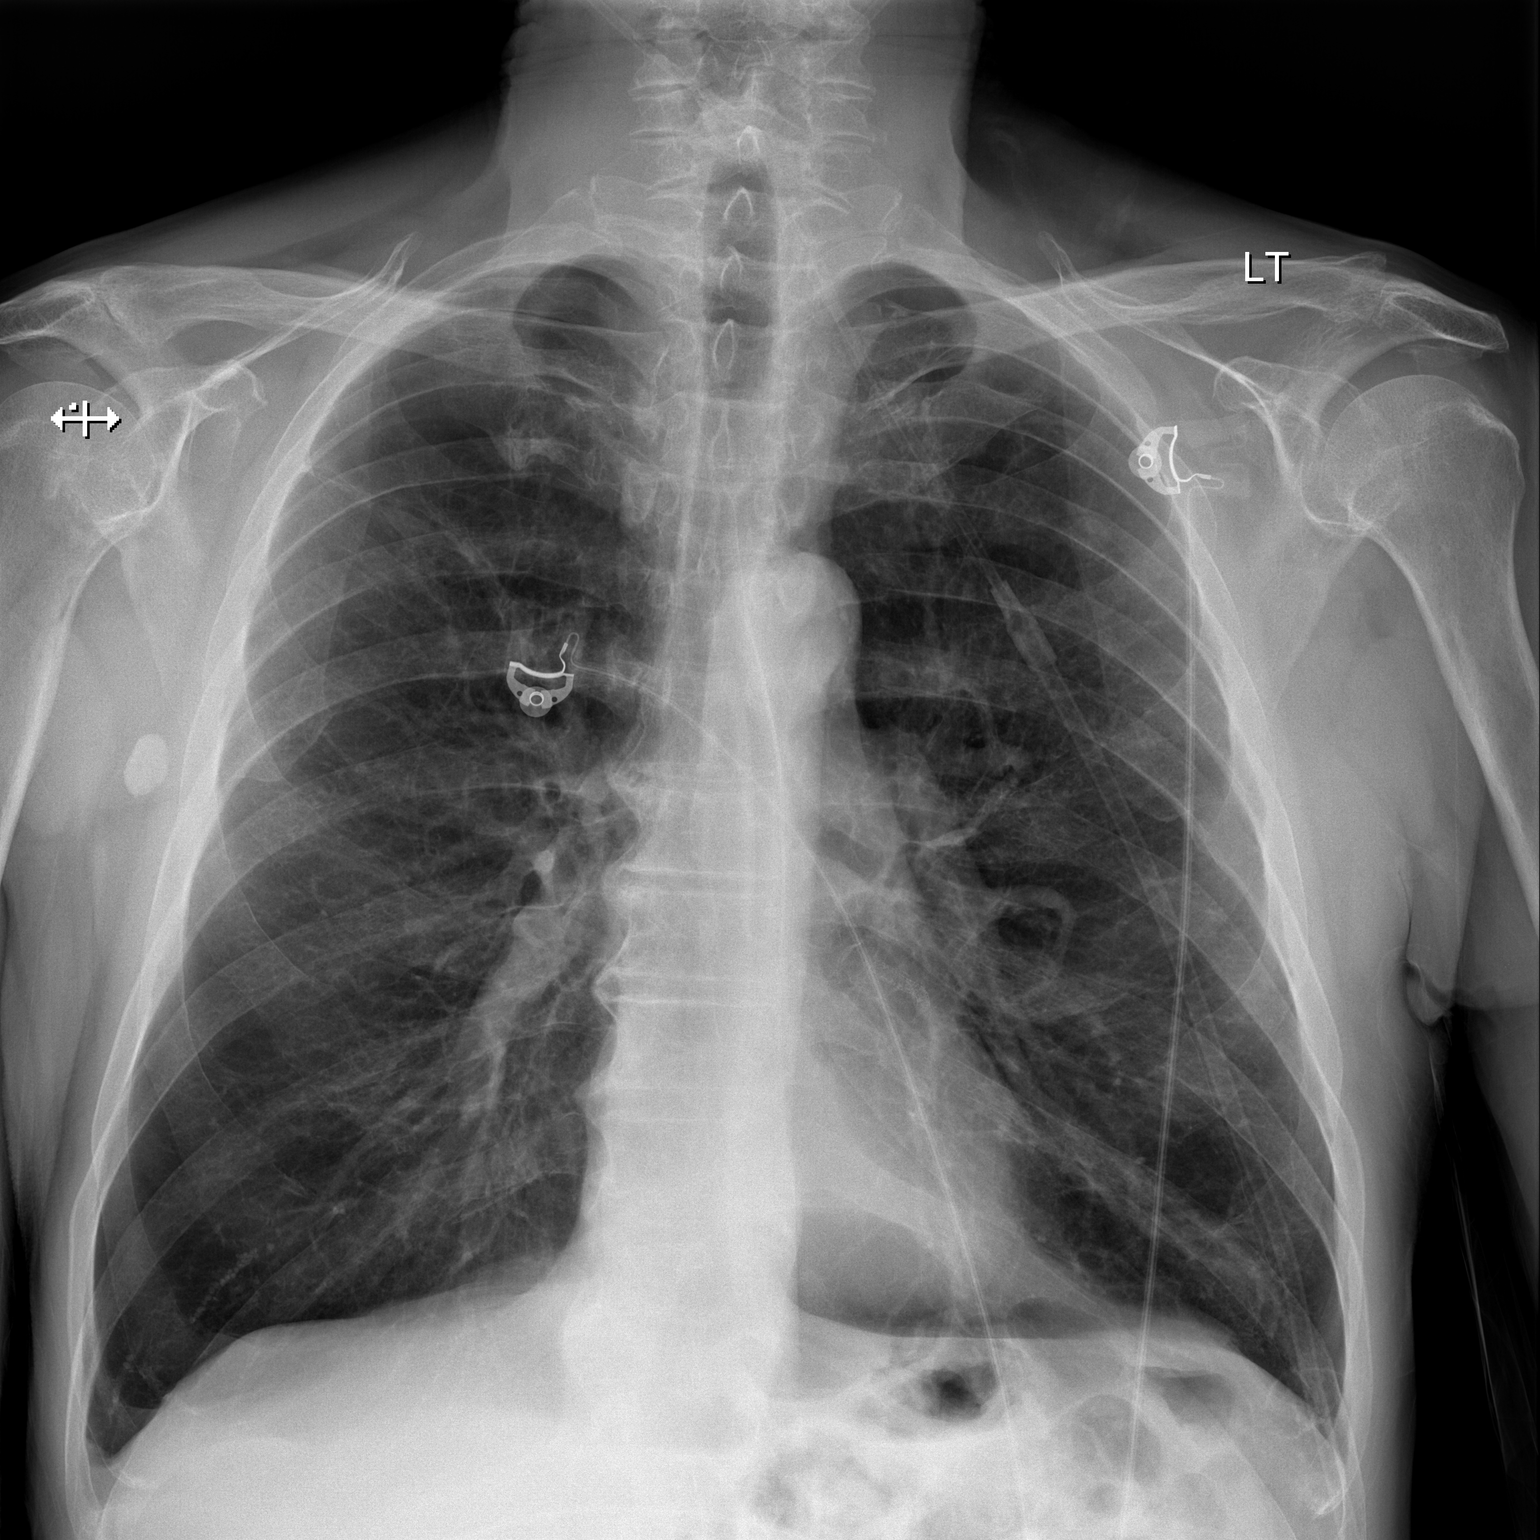
[im 2/2]
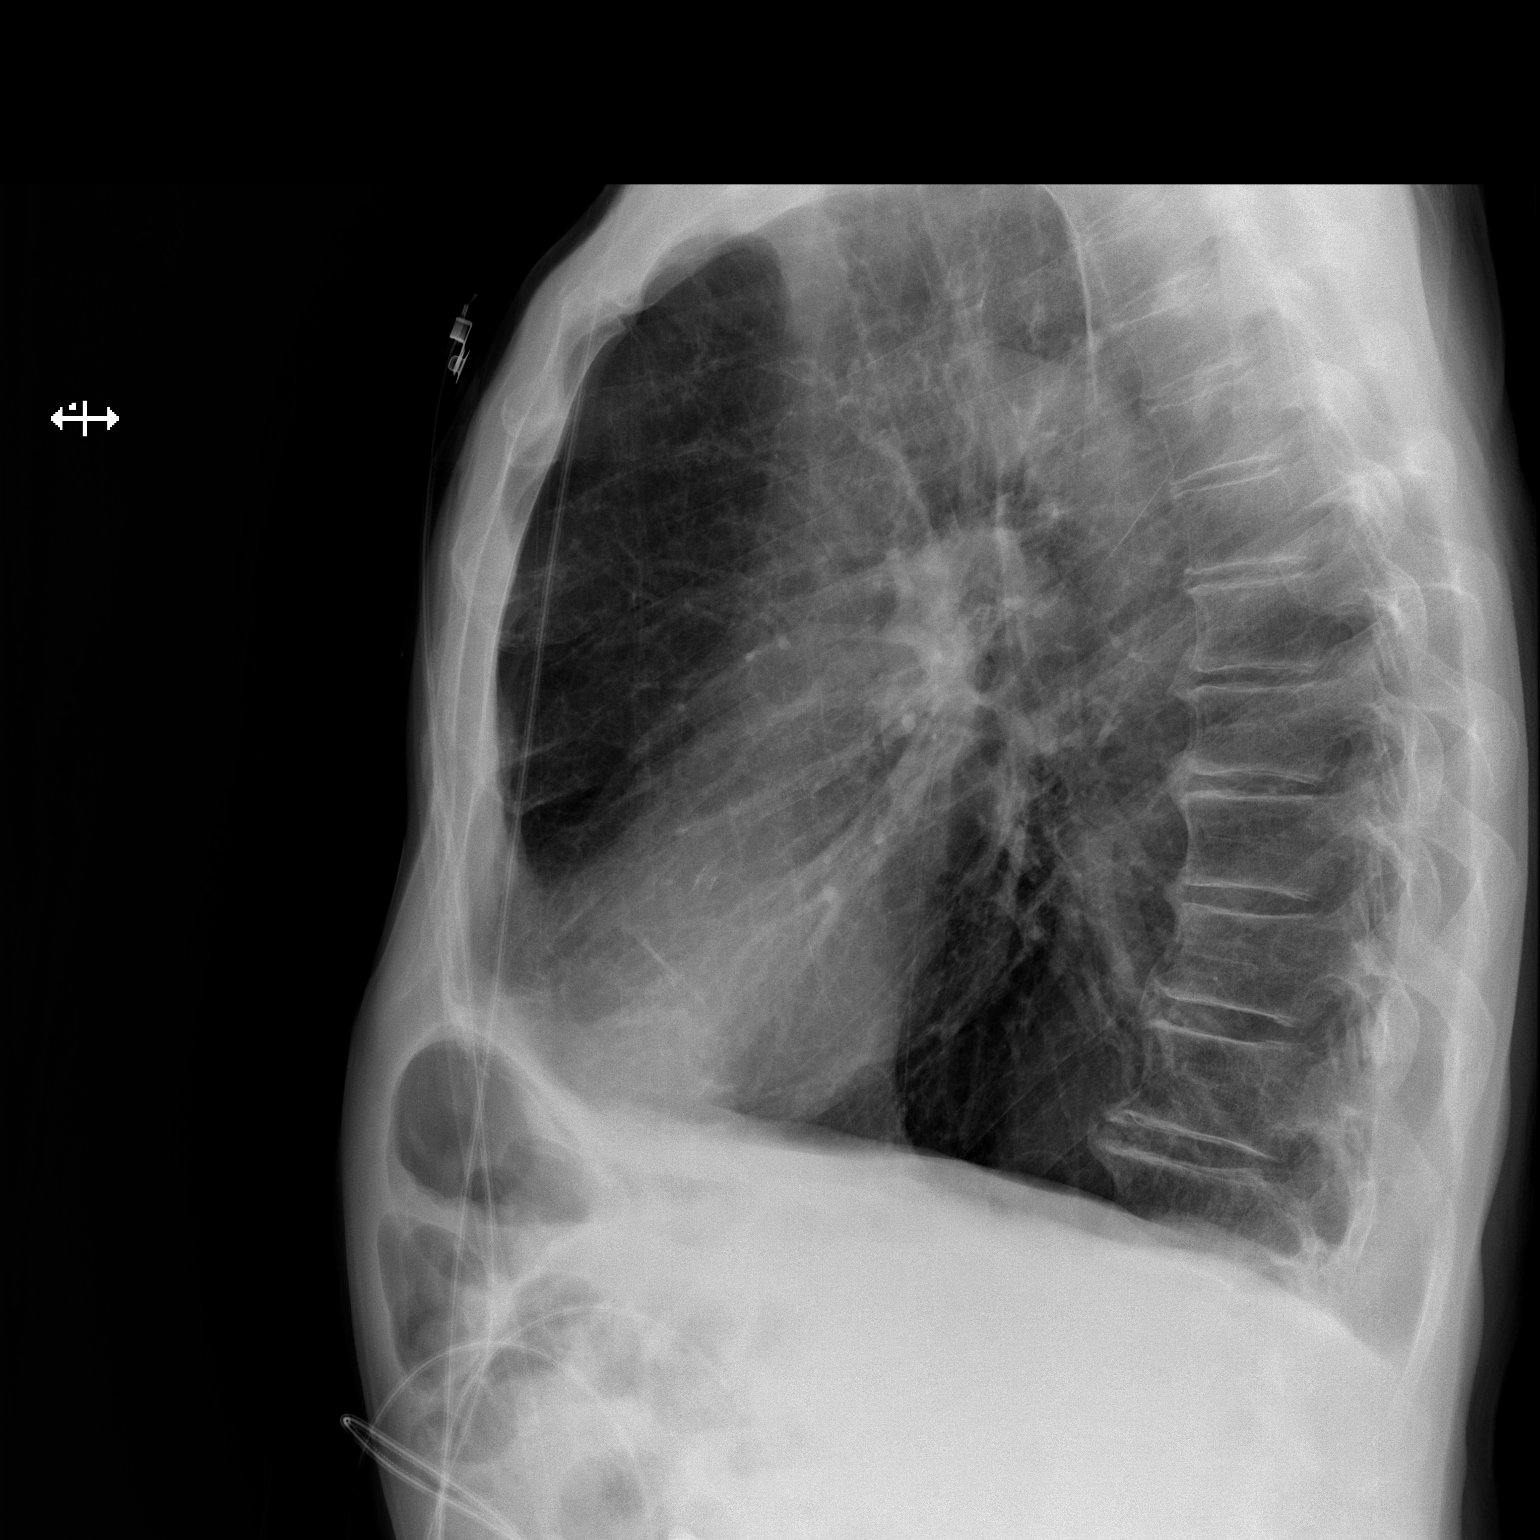

[2 of 2 positions shown; findings below may reference images not displayed]

FINDINGS: Emphysematous hyperinflation of the lungs. Normal heart size with
minimal aortic atherosclerosis. Stable 1 cm nodule projects over the
left upper lobe. No pulmonary consolidation or overt pulmonary
edema. No effusion. Mild anterior eventration of the left
hemidiaphragm with bowel projecting beneath it as before. No
aggressive osseous lesions. Calcified lymph node in the right
axilla.
IMPRESSION: COPD with minimal aortic atherosclerosis. No active pulmonary
disease.

Calcified nodule left upper lobe measuring approximate 1 cm. Other
ancillary findings as above.

## 2018-09-29 MED ORDER — CIPROFLOXACIN HCL 500 MG PO TABS
500.0000 mg | ORAL_TABLET | Freq: Two times a day (BID) | ORAL | Status: DC
  Administered 2018-09-30: 500 mg via ORAL
  Filled 2018-09-29 (×2): qty 1

## 2018-09-29 MED ORDER — IPRATROPIUM-ALBUTEROL 0.5-2.5 (3) MG/3ML IN SOLN
3.0000 mL | Freq: Four times a day (QID) | RESPIRATORY_TRACT | Status: DC
  Administered 2018-09-29 – 2018-10-01 (×8): 3 mL via RESPIRATORY_TRACT
  Filled 2018-09-29 (×8): qty 3

## 2018-09-29 MED ORDER — ALBUTEROL SULFATE (2.5 MG/3ML) 0.083% IN NEBU: 2.5000 mg | INHALATION_SOLUTION | RESPIRATORY_TRACT | Status: DC | PRN

## 2018-09-29 MED ORDER — METHYLPREDNISOLONE SODIUM SUCC 125 MG IJ SOLR
125.0000 mg | Freq: Once | INTRAMUSCULAR | Status: AC
  Administered 2018-09-29: 125 mg via INTRAVENOUS
  Filled 2018-09-29: qty 2

## 2018-09-29 MED ORDER — IPRATROPIUM-ALBUTEROL 0.5-2.5 (3) MG/3ML IN SOLN
3.0000 mL | Freq: Once | RESPIRATORY_TRACT | Status: AC
  Administered 2018-09-29: 3 mL via RESPIRATORY_TRACT
  Filled 2018-09-29: qty 3

## 2018-09-29 MED ORDER — METHYLPREDNISOLONE SODIUM SUCC 125 MG IJ SOLR
60.0000 mg | Freq: Four times a day (QID) | INTRAMUSCULAR | Status: AC
  Administered 2018-09-30 (×4): 60 mg via INTRAVENOUS
  Filled 2018-09-29 (×4): qty 2

## 2018-09-29 MED ORDER — NICOTINE 21 MG/24HR TD PT24
21.0000 mg | MEDICATED_PATCH | Freq: Every day | TRANSDERMAL | Status: DC
  Administered 2018-09-30 – 2018-10-01 (×2): 21 mg via TRANSDERMAL
  Filled 2018-09-29 (×3): qty 1

## 2018-09-29 MED ORDER — LEVOFLOXACIN IN D5W 750 MG/150ML IV SOLN
750.0000 mg | Freq: Once | INTRAVENOUS | Status: AC
  Administered 2018-09-29: 750 mg via INTRAVENOUS
  Filled 2018-09-29: qty 150

## 2018-09-29 MED ORDER — PREDNISONE 20 MG PO TABS
40.0000 mg | ORAL_TABLET | Freq: Every day | ORAL | Status: DC
  Administered 2018-10-01: 09:00:00 40 mg via ORAL
  Filled 2018-09-29: qty 2

## 2018-09-29 NOTE — ED Notes (Signed)
Floor unable to take report at this time.

## 2018-09-29 NOTE — ED Triage Notes (Signed)
PT to ED via POV with c/o increased SOB and cough  x4 days, hx of COPD. PT wears o2 prn at home. PT dyspneic and appears resteless. 98% on 2L. Denies fever

## 2018-09-29 NOTE — Progress Notes (Signed)
Pt transported to ICU-11 WITHOUT ANY INCIDENT ,BIPAP PLUGGED INTO RED OUTLET  REPORT GIVEN TO RT IN CHARGEOFTHE PT.

## 2018-09-29 NOTE — H&P (Signed)
[facility] NAME: Leslie Sims    MR#:  [id number]  DATE OF BIRTH:  1951/01/06  DATE OF ADMISSION:  09/29/2018  PRIMARY CARE PHYSICIAN: [name], MD   REQUESTING/REFERRING PHYSICIAN: Mariea Clonts  CHIEF COMPLAINT:   Shortness of breath HISTORY OF PRESENT ILLNESS:  Leslie Sims  is a 67 y.o. male with a known history of COPD lives on 2 L of oxygen, continues to smoke, coronary artery disease, chronic systolic congestive heart failure and multiple other medical problems is presenting to the ED with a chief complaint of shortness of breath for the past 4 days, which is getting worse and patient can barely go to the bathroom.  Reporting nonproductive cough and came into the ED.  Patient was given bronchodilator treatments and Solu-Medrol with minimal improvement.  Patient was placed on BiPAP and hospitalist team is called to admit the patient.  Patient started feeling slightly better after placing BiPAP but still short of breath with minimal exertion.  No family members at bedside.  Admits  to smoking  PAST MEDICAL HISTORY:   Past Medical History:  Diagnosis Date  . Allergic rhinitis   . Anxiety   . Back pain   . Colon polyps 08/2012   colonoscopy; multiple colon polyps; repeat colonoscopy in one year.  [name] COPD (chronic obstructive pulmonary disease) ([place])   . Coronary artery disease 11/2009   a. late presenting ant MI; b. LHC 100% pLAD s/p PCI/DES, 99% mRCA s/p PCI/DES, EF 35%; c. nuclear stress test 05/13: prior ant/inf infarcts w/o ischemia, EF 43%; d. LHC 02/14: widely patent stents with no other obs dz, EF 40%; e. LHC 11/17: LM nl, mLAD 10%, patent LAD stent, dLAD 20%, p-mRCA 10%, mRCA 40%, patent RCA stent, EF 35-45%   . Depression   . GERD (gastroesophageal reflux disease)   . Headache(784.0)   . Helicobacter pylori (H. pylori)   . Hemorrhoids   . Hernia    inguinal  . Hyperlipidemia   . Hypertension   .  Insomnia   . Ischemic cardiomyopathy   . Myalgia   . Peptic ulcer   . Pulmonary nodules   . ST elevation (STEMI) myocardial infarction involving left anterior descending coronary artery ([place]) 11/2009   a. s/p PCI to the LAD  . Status post dilation of esophageal narrowing 2000  . Tinea pedis   . Wears dentures    partial upper    PAST SURGICAL HISTOIRY:   Past Surgical History:  Procedure Laterality Date  . Admission  12/20/2012   COPD exacerbation.  [place].  [name] CARDIAC CATHETERIZATION    . CARDIAC CATHETERIZATION  02/05/13   [facility]  . CARDIAC CATHETERIZATION  10/14   [facility] : patent stents with no change in anatomy. EF: 40$  . CARDIAC CATHETERIZATION Left 11/12/2016   Procedure: Left Heart Cath and Coronary Angiography;  Surgeon: Wellington Hampshire, MD;  Location: [facility];  Service: Cardiovascular;  Laterality: Left;  . COLONOSCOPY    . COLONOSCOPY WITH PROPOFOL N/A 05/18/2016   Procedure: COLONOSCOPY WITH PROPOFOL;  Surgeon: Lucilla Lame, MD;  Location: [facility] ENDOSCOPY;  Service: Endoscopy;  Laterality: N/A;  . CORONARY ANGIOPLASTY WITH STENT PLACEMENT  09/2009   LAD 3.0 X23 mm Xience DES, RCA: 4.0 X 15 mm Xience DES  . ELECTROMAGNETIC NAVIGATION BROCHOSCOPY N/A 10/27/2017   Procedure: ELECTROMAGNETIC NAVIGATION BRONCHOSCOPY;  Surgeon: Flora Lipps, MD;  Location: [facility] ORS;  Service: Cardiopulmonary;  Laterality: N/A;  .  ESOPHAGEAL DILATION    . ESOPHAGOGASTRODUODENOSCOPY  2008  . ESOPHAGOGASTRODUODENOSCOPY  08/20/2012  . HERNIA REPAIR  07/20/2012   L inguinal hernia repair  . PENILE PROSTHESIS IMPLANT      SOCIAL HISTORY:   Social History   Tobacco Use  . Smoking status: Current Every Day Smoker    Packs/day: 0.50    Years: 42.00    Pack years: 21.00    Types: Cigarettes  . Smokeless tobacco: Never Used  Substance Use Topics  . Alcohol use: No    Alcohol/week: 0.0 standard drinks    FAMILY HISTORY:   Family History  Problem Relation Age of Onset  . Heart attack  Brother        Brother #1  . Diabetes Brother   . Hypertension Brother        #3  . Coronary artery disease Father 60       deceased  . Heart attack Father   . Diabetes Father   . Heart disease Father   . COPD Mother 33       deceased  . Alcohol abuse Sister        polysubstance abuse  . COPD Sister   . Lung cancer Sister   . Alcohol abuse Sister        polysubstance abuse  . Penile cancer Brother   . Diabetes Brother     DRUG ALLERGIES:   Allergies  Allergen Reactions  . Prozac [Fluoxetine Hcl] Shortness Of Breath  . Effexor Xr [Venlafaxine Hcl Er] Other (See Comments)    "Makes me feel funny"  . Wellbutrin [Bupropion] Other (See Comments)    "Makes me feel funny"    REVIEW OF SYSTEMS:  CONSTITUTIONAL: No fever, fatigue or weakness.  EYES: No blurred or double vision.  EARS, NOSE, AND THROAT: No tinnitus or ear pain.  RESPIRATORY: Reporting cough, shortness of breath, wheezing, no hemoptysis.  CARDIOVASCULAR: No chest pain, orthopnea, edema.  GASTROINTESTINAL: No nausea, vomiting, diarrhea or abdominal pain.  GENITOURINARY: No dysuria, hematuria.  ENDOCRINE: No polyuria, nocturia,  HEMATOLOGY: No anemia, easy bruising or bleeding SKIN: No rash or lesion. MUSCULOSKELETAL: No joint pain or arthritis.   NEUROLOGIC: No tingling, numbness, weakness.  PSYCHIATRY: No anxiety or depression.   MEDICATIONS AT HOME:   Prior to Admission medications   Medication Sig Start Date End Date Taking? Authorizing Provider  acetaminophen (TYLENOL) 325 MG tablet Take 2 tablets (650 mg total) by mouth every 6 (six) hours as needed for mild pain (or Fever >/= 101). 08/26/18  Yes Dashonda Bonneau, MD  albuterol (VENTOLIN HFA) 108 (90 Base) MCG/ACT inhaler USE 2 INHALATIONS EVERY 6 HOURS AS NEEDED FOR WHEEZING 08/19/17  Yes [name], MD  ARIPiprazole (ABILIFY) 5 MG tablet Take 5 mg by mouth daily. 07/26/18  Yes [provider]  aspirin 81 MG tablet Take 81 mg by mouth daily.      Yes [provider]  atorvastatin (LIPITOR) 40 MG tablet Take 1 tablet (40 mg total) by mouth daily. 03/03/18  Yes [name], MD  Fluticasone-Salmeterol (ADVAIR DISKUS) 500-50 MCG/DOSE AEPB Inhale 1 puff into the lungs 2 (two) times daily. 07/27/17  Yes [name], MD  furosemide (LASIX) 20 MG tablet Take 1 tablet (20 mg total) by mouth daily. 07/07/18 09/30/19 Yes [name], MD  hydrOXYzine (VISTARIL) 25 MG capsule Take 25 mg by mouth 2 (two) times daily as needed. 06/05/18  Yes [provider]  ipratropium-albuterol (DUONEB) 0.5-2.5 (3)  MG/3ML SOLN Take 3 mLs by nebulization every 4 (four) hours as needed. 06/24/18  Yes [name], MD  LORazepam (ATIVAN) 1 MG tablet Take 1 tablet (1 mg total) by mouth every 8 (eight) hours as needed for anxiety. 07/07/18  Yes [name], MD  losartan (COZAAR) 25 MG tablet TAKE 1 TABLET DAILY 01/27/18  Yes Gollan, Kathlene November, MD  mirtazapine (REMERON) 30 MG tablet Take 1 tablet (30 mg total) by mouth at bedtime. 06/24/18  Yes [name], MD  Multiple Vitamin (MULTIVITAMIN) capsule Take 1 capsule by mouth daily.   Yes [provider]  nitroGLYCERIN (NITROSTAT) 0.4 MG SL tablet Place 1 tablet (0.4 mg total) under the tongue every 5 (five) minutes as needed. 10/17/17  Yes Wellington Hampshire, MD  sertraline (ZOLOFT) 100 MG tablet Take 100 mg by mouth daily.  06/28/18  Yes [provider]  SPIRIVA HANDIHALER 18 MCG inhalation capsule PLACE 1 CAPSULE INTO INHALER AND INHALE THE CONTENTS DAILY 07/26/18  Yes Carles Collet M, PA-C  sucralfate (CARAFATE) 1 GM/10ML suspension Take 10 mLs (1 g total) by mouth 4 (four) times daily -  with meals and at bedtime. Patient taking differently: Take 1 g by mouth 4 (four) times daily as needed.  05/16/18  Yes [name], MD  predniSONE (DELTASONE) 20 MG tablet Take 2 tablets (40 mg total) by mouth daily with breakfast. Patient not taking: Reported on 09/29/2018 09/15/18   Demetrios Loll, MD      VITAL SIGNS:  Blood pressure 109/86, pulse (!) 47, temperature 98.3 F (36.8 C), temperature source Oral, resp. rate 17, height 5\' 7"  (1.702 m), weight 65.3 kg, SpO2 95 %.  PHYSICAL EXAMINATION:  GENERAL:  67 y.o.-year-old patient lying in the bed with no acute distress.  EYES: Pupils equal, round, reactive to light and accommodation. No scleral icterus. Extraocular muscles intact.  HEENT: Head atraumatic, normocephalic. Oropharynx and nasopharynx clear.  NECK:  Supple, no jugular venous distention. No thyroid enlargement, no tenderness.  LUNGS: Diminished breath sounds bilaterally, minimal diffuse wheezing, rales,rhonchi or crepitation. No use of accessory muscles of respiration.  On BiPAP CARDIOVASCULAR: S1, S2 normal. No murmurs, rubs, or gallops.  ABDOMEN: Soft, nontender, nondistended. Bowel sounds present. No organomegaly or mass.  EXTREMITIES: No pedal edema, cyanosis, or clubbing.  NEUROLOGIC: Awake, alert and oriented x3 sensation intact. Gait not checked.  PSYCHIATRIC: The patient is alert and oriented x 3.  SKIN: No obvious rash, lesion, or ulcer.   LABORATORY PANEL:   CBC Recent Labs  Lab 09/29/18 1723  WBC 10.9*  HGB 15.0  HCT 46.7  PLT 250   ------------------------------------------------------------------------------------------------------------------  Chemistries  Recent Labs  Lab 09/29/18 1723  NA 140  K 3.9  CL 98  CO2 36*  GLUCOSE 109*  BUN 9  CREATININE 0.73  CALCIUM 9.4   ------------------------------------------------------------------------------------------------------------------  Cardiac Enzymes Recent Labs  Lab 09/29/18 1723  TROPONINI <0.03   ------------------------------------------------------------------------------------------------------------------  RADIOLOGY:  Dg Chest 2 View  Result Date: 09/29/2018 CLINICAL DATA:  Increasing dyspnea and cough x4 days EXAM: CHEST - 2 VIEW COMPARISON:  09/13/2018 CT and  CXR FINDINGS: Emphysematous hyperinflation of the lungs. Normal heart size with minimal aortic atherosclerosis. Stable 1 cm nodule projects over the left upper lobe. No pulmonary consolidation or overt pulmonary edema. No effusion. Mild anterior eventration of the left hemidiaphragm with bowel projecting beneath it as before. No aggressive osseous lesions. Calcified lymph node in the right axilla. IMPRESSION: COPD with minimal aortic  atherosclerosis. No active pulmonary disease. Calcified nodule left upper lobe measuring approximate 1 cm. Other ancillary findings as above. Electronically Signed   By: Ashley Royalty M.D.   On: 09/29/2018 18:03    EKG:   Orders placed or performed during the hospital encounter of 09/29/18  . ED EKG within 10 minutes  . ED EKG within 10 minutes    IMPRESSION AND PLAN:     #Acute hypoxic respiratory failure secondary to COPD exacerbation Admit to stepdown unit Currently on BiPAP 40% FiO2 wean off as tolerated Consult placed and discussed with intensivist IV Solu-Medrol 1.5 mg was given.  We will continue 60 mg IV every 6 hours Bronchodilator treatments with albuterol every 4 hours as needed and duo nebs every 6 hours Urine culture and sensitivity if sample obtainable Patient continues to smoke and smoking cessation advised Levofloxacin x1 was given in the emergency department will continue ciprofloxacin from tomorrow as an empiric antibiotic  #Chronic systolic congestive heart failure No fluid overload at this time Continue close monitoring Monitor intake and output and daily weights P.o. meds on hold as patient is on BiPAP  #Left upper lobe nodule Follow-up with primary care physician for surveillance  #Coronary artery disease Patient is a symptomatic Denies any chest pain Hold p.o. medications as patient is currently on BiPAP  #Tobacco abuse disorder Counseled patient to quit smoking for 5 minutes.  Patient verbalized understanding agreement with  nicotine patch    All the records are reviewed and case discussed with ED provider. Management plans discussed with the patient, family and they are in agreement.  CODE STATUS: fc   TOTAL TIME TAKING CARE OF THIS PATIENT: 45  minutes.   Note: This dictation was prepared with Dragon dictation along with smaller phrase technology. Any transcriptional errors that result from this process are unintentional.  [name] M.D on 09/29/2018 at 7:55 PM  Between 7am to 6pm - Pager - [phone]  After 6pm go to [email or web address] - password [email or web address] [facility]  Office  [phone]  CC: Primary care physician; [name], MD

## 2018-09-29 NOTE — Progress Notes (Signed)
Pt arrived to room 11 with a trifold, green/brown wallet, containing $420.00 in cash.  Sixteen  Twenty dollar bills and one one-hundred dollar bill for a total of $420.00.  Pt requested to have wallet and money locked up.  Money was counted by myself, Tess, rn and security.  Pt agreed and signed off on amount.  Wallet and money was placed in an envelope with proper documentation to be locked up by security.  There is a key along with paperwork in the patient's chart to be given to him upon discharge.

## 2018-09-29 NOTE — ED Notes (Signed)
Pt ambulated in room then to hallway on room air. Pt's respiratory rate maintained above 35/min and then his oxygen dropped to 84%, pt taken back to room and placed on 2L with an increase to 94% on room air.

## 2018-09-29 NOTE — Progress Notes (Signed)
Family Meeting Note  Advance Directive:yes  Today a meeting took place with the Patient.    The following clinical team members were present during this meeting:MD  The following were discussed:Patient's diagnosis: Acute hypoxic respiratory failure, acute COPD, on BiPAP, continues to smoke, left upper lobe nodule of the lung, other comorbidities as documented below, treatment plan of care discussed in detail with the patient.  He verbalized understanding of the plan  Allergic rhinitis    . Anxiety   . Back pain   . Colon polyps 08/2012   colonoscopy; multiple colon polyps; repeat colonoscopy in one year.  [name] COPD (chronic obstructive pulmonary disease) ([place])   . Coronary artery disease 11/2009   a. late presenting ant MI; b. LHC 100% pLAD s/p PCI/DES, 99% mRCA s/p PCI/DES, EF 35%; c. nuclear stress test 05/13: prior ant/inf infarcts w/o ischemia, EF 43%; d. LHC 02/14: widely patent stents with no other obs dz, EF 40%; e. LHC 11/17: LM nl, mLAD 10%, patent LAD stent, dLAD 20%, p-mRCA 10%, mRCA 40%, patent RCA stent, EF 35-45%   . Depression   . GERD (gastroesophageal reflux disease)   . Headache(784.0)   . Helicobacter pylori (H. pylori)   . Hemorrhoids   . Hernia    inguinal  . Hyperlipidemia   . Hypertension   . Insomnia   . Ischemic cardiomyopathy   . Myalgia   . Peptic ulcer   . Pulmonary nodules   . ST elevation (STEMI) myocardial infarction involving left anterior descending coronary artery ([place]) 11/2009   a. s/p PCI to the LAD      patient's progosis: Unable to determine and Goals for treatment: Full Code Daughter Particia Nearing  is the healthcare POA Additional follow-up to be provided: Hospitalist, intensivist  Time spent during discussion:17 min  [name], MD

## 2018-09-29 NOTE — ED Provider Notes (Addendum)
[facility] Emergency Department Provider Note  ____________________________________________  Time seen: Approximately 6:21 PM  I have reviewed the triage vital signs and the nursing notes.   HISTORY  Chief Complaint Shortness of Breath    HPI Leslie Sims is a 67 y.o. male with a history of COPD on 2 L nasal cannula at night, ongoing tobacco abuse, CAD status post MI presenting with exertional shortness of breath.  The patient reports that for the past 4 days, he has had a progressively worsening exertional shortness of breath and now can barely go to the bathroom and back to the couch without getting significantly out of breath.  He has had a nonproductive cough with a mild sore throat but no congestion or rhinorrhea.  No nausea vomiting or diarrhea.  The patient was discharged from the hospital 09/15/2018 after admission for respiratory failure with hypercarbia and hypoxia in the setting of COPD exacerbation.  Past Medical History:  Diagnosis Date  . Allergic rhinitis   . Anxiety   . Back pain   . Colon polyps 08/2012   colonoscopy; multiple colon polyps; repeat colonoscopy in one year.  [name] COPD (chronic obstructive pulmonary disease) ([place])   . Coronary artery disease 11/2009   a. late presenting ant MI; b. LHC 100% pLAD s/p PCI/DES, 99% mRCA s/p PCI/DES, EF 35%; c. nuclear stress test 05/13: prior ant/inf infarcts w/o ischemia, EF 43%; d. LHC 02/14: widely patent stents with no other obs dz, EF 40%; e. LHC 11/17: LM nl, mLAD 10%, patent LAD stent, dLAD 20%, p-mRCA 10%, mRCA 40%, patent RCA stent, EF 35-45%   . Depression   . GERD (gastroesophageal reflux disease)   . Headache(784.0)   . Helicobacter pylori (H. pylori)   . Hemorrhoids   . Hernia    inguinal  . Hyperlipidemia   . Hypertension   . Insomnia   . Ischemic cardiomyopathy   . Myalgia   . Peptic ulcer   . Pulmonary nodules   . ST elevation (STEMI) myocardial infarction involving left  anterior descending coronary artery ([place]) 11/2009   a. s/p PCI to the LAD  . Status post dilation of esophageal narrowing 2000  . Tinea pedis   . Wears dentures    partial upper    Patient Active Problem List   Diagnosis Date Noted  . Acute respiratory failure with hypoxia and hypercapnia ([place]) 09/13/2018  . Acute on chronic respiratory failure with hypoxia and hypercapnia ([place]) 08/24/2018  . Chest pain 07/02/2018  . Grief reaction 01/13/2018  . COPD with hypoxia ([place]) 12/25/2017  . Unstable angina ([place])   . Benign neoplasm of ascending colon   . Benign neoplasm of descending colon   . Benign neoplasm of sigmoid colon   . Personal history of colonic polyps   . Pulmonary nodules/lesions, multiple 01/05/2015  . Bruit of left carotid artery 11/04/2014  . Sleep disorder 07/18/2013  . Seasonal and perennial allergic rhinitis 05/29/2013  . Bradycardia 04/20/2011  . SMOKER 07/30/2010  . CAD, NATIVE VESSEL 04/23/2010  . Hyperlipidemia 03/24/2010  . DEPRESSION/ANXIETY 03/24/2010  . Chronic systolic heart failure ([place]) 03/24/2010  . COPD, very severe ([place]) 03/24/2010  . GERD 03/24/2010  . Essential hypertension 11/21/2009    Past Surgical History:  Procedure Laterality Date  . Admission  12/20/2012   COPD exacerbation.  [place].  [name] CARDIAC CATHETERIZATION    . CARDIAC CATHETERIZATION  02/05/13   [facility]  . CARDIAC CATHETERIZATION  10/14   [facility] : patent  stents with no change in anatomy. EF: 40$  . CARDIAC CATHETERIZATION Left 11/12/2016   Procedure: Left Heart Cath and Coronary Angiography;  Surgeon: Wellington Hampshire, MD;  Location: [facility];  Service: Cardiovascular;  Laterality: Left;  . COLONOSCOPY    . COLONOSCOPY WITH PROPOFOL N/A 05/18/2016   Procedure: COLONOSCOPY WITH PROPOFOL;  Surgeon: Lucilla Lame, MD;  Location: [facility] ENDOSCOPY;  Service: Endoscopy;  Laterality: N/A;  . CORONARY ANGIOPLASTY WITH STENT PLACEMENT  09/2009   LAD 3.0 X23 mm Xience DES, RCA: 4.0 X 15 mm  Xience DES  . ELECTROMAGNETIC NAVIGATION BROCHOSCOPY N/A 10/27/2017   Procedure: ELECTROMAGNETIC NAVIGATION BRONCHOSCOPY;  Surgeon: Flora Lipps, MD;  Location: [facility] ORS;  Service: Cardiopulmonary;  Laterality: N/A;  . ESOPHAGEAL DILATION    . ESOPHAGOGASTRODUODENOSCOPY  2008  . ESOPHAGOGASTRODUODENOSCOPY  08/20/2012  . HERNIA REPAIR  07/20/2012   L inguinal hernia repair  . PENILE PROSTHESIS IMPLANT      Current Outpatient Rx  . Order #: [id number] Class: No Print  . Order #: [id number] Class: Normal  . Order #: [id number] Class: Historical Med  . Order #: [id number] Class: Historical Med  . Order #: [id number] Class: Normal  . Order #: [id number] Class: Normal  . Order #: [id number] Class: Normal  . Order #: [id number] Class: Historical Med  . Order #: [id number] Class: Normal  . Order #: [id number] Class: Normal  . Order #: [id number] Class: Normal  . Order #: [id number] Class: Normal  . Order #: [id number] Class: Historical Med  . Order #: [id number] Class: Normal  . Order #: [id number] Class: Print  . Order #: [id number] Class: Historical Med  . Order #: [id number] Class: Normal  . Order #: [id number] Class: Normal    Allergies Prozac [fluoxetine hcl]; Effexor xr [venlafaxine hcl er]; and Wellbutrin [bupropion]  Family History  Problem Relation Age of Onset  . Heart attack Brother        Brother #1  . Diabetes Brother   . Hypertension Brother        #3  . Coronary artery disease Father 57       deceased  . Heart attack Father   . Diabetes Father   . Heart disease Father   . COPD Mother 37       deceased  . Alcohol abuse Sister        polysubstance abuse  . COPD Sister   . Lung cancer Sister   . Alcohol abuse Sister        polysubstance abuse  . Penile cancer Brother   . Diabetes Brother     Social History Social History   Tobacco Use  . Smoking status: Current Every Day Smoker    Packs/day: 0.50    Years: 42.00    Pack years: 21.00    Types: Cigarettes  . Smokeless tobacco: Never Used   Substance Use Topics  . Alcohol use: No    Alcohol/week: 0.0 standard drinks  . Drug use: No    Review of Systems Constitutional: No fever/chills.  No lightheadedness or syncope.  No diaphoresis. Eyes: No visual changes.  No eye discharge. ENT: Mild sore throat. No congestion or rhinorrhea. Cardiovascular: Denies chest pain. Denies palpitations. Respiratory: Positive exertional shortness of breath.  Positive nonproductive cough. Gastrointestinal: No abdominal pain.  No nausea, no vomiting.  No diarrhea.  No constipation. Genitourinary: Negative for dysuria. Musculoskeletal: Negative for back pain. Skin: Negative for rash. Neurological: Negative for headaches. No focal numbness, tingling or weakness.     ____________________________________________   PHYSICAL EXAM:  VITAL SIGNS: ED Triage Vitals [  09/29/18 1719]  Enc Vitals Group     BP (!) 142/88     Pulse Rate 93     Resp (!) 22     Temp 98.3 F (36.8 C)     Temp Source Oral     SpO2 98 %     Weight 144 lb (65.3 kg)     Height 5\' 7"  (1.702 m)     Head Circumference      Peak Flow      Pain Score 0     Pain Loc      Pain Edu?      Excl. in [place]?     Constitutional: Alert and oriented. Answers questions appropriately.  Chronically ill-appearing, tachypneic, uncomfortable. Eyes: Conjunctivae are normal.  EOMI. No scleral icterus.  No eye discharge. Head: Atraumatic. Nose: No congestion/rhinnorhea. Mouth/Throat: Mucous membranes are mildly dry.  Neck: No stridor.  Supple.  Positive JVD.  No meningismus. Cardiovascular: Normal rate, regular rhythm. No murmurs, rubs or gallops.  Respiratory: The patient is tachypneic with accessory muscle use and retractions.  He has diffuse end expiratory wheezing without any rhonchi or rales. Gastrointestinal: Soft, nontender and nondistended.  No guarding or rebound.  No peritoneal signs. Musculoskeletal: No LE edema. No ttp in the calves or palpable cords.  Negative Homan's  sign. Neurologic:  A&Ox3.  Speech is clear.  Face and smile are symmetric.  EOMI.  Moves all extremities well. Skin:  Skin is warm, dry and intact. No rash noted. Psychiatric: Mood and affect are normal.____________________________________________   LABS (all labs ordered are listed, but only abnormal results are displayed)  Labs Reviewed  BASIC METABOLIC PANEL - Abnormal; Notable for the following components:      Result Value   CO2 36 (*)    Glucose, Bld 109 (*)    All other components within normal limits  CBC - Abnormal; Notable for the following components:   WBC 10.9 (*)    All other components within normal limits  CULTURE, BLOOD (ROUTINE X 2)  CULTURE, BLOOD (ROUTINE X 2)  TROPONIN I  TSH  BRAIN NATRIURETIC PEPTIDE  BLOOD GAS, VENOUS  URINALYSIS, COMPLETE (UACMP) WITH MICROSCOPIC   ____________________________________________  EKG  ED ECG REPORT I, Anne-Caroline Mariea Clonts, the attending physician, personally viewed and interpreted this ECG.   Date: 09/29/2018  EKG Time: 1726  Rate: 97  Rhythm: normal sinus rhythm  Axis: leftward  Intervals:prolonged QTc  ST&T Change: No STEMI; poor baseline tracing.  ____________________________________________  RADIOLOGY  Dg Chest 2 View  Result Date: 09/29/2018 CLINICAL DATA:  Increasing dyspnea and cough x4 days EXAM: CHEST - 2 VIEW COMPARISON:  09/13/2018 CT and CXR FINDINGS: Emphysematous hyperinflation of the lungs. Normal heart size with minimal aortic atherosclerosis. Stable 1 cm nodule projects over the left upper lobe. No pulmonary consolidation or overt pulmonary edema. No effusion. Mild anterior eventration of the left hemidiaphragm with bowel projecting beneath it as before. No aggressive osseous lesions. Calcified lymph node in the right axilla. IMPRESSION: COPD with minimal aortic atherosclerosis. No active pulmonary disease. Calcified nodule left upper lobe measuring approximate 1 cm. Other ancillary findings as  above. Electronically Signed   By: Ashley Royalty M.D.   On: 09/29/2018 18:03    ____________________________________________   PROCEDURES  Procedure(s) performed: None  Procedures  Critical Care performed: Yes ____________________________________________   INITIAL IMPRESSION / ASSESSMENT AND PLAN / ED COURSE  Pertinent labs & imaging results that were available during my care of the patient were  reviewed by me and considered in my medical decision making (see chart for details).  67 y.o. male with ongoing tobacco abuse and COPD, CAD, presenting for exertional shortness of breath and severe decrease in his exercise tolerance with cough over the past 4 days.  Overall, the patient is concerning for respiratory distress with accessory muscle use and retractions, as well as wheezing.  I am concerned about COPD exacerbation, but would also consider hospital-acquired pneumonia given his recent hospitalization.  Blood cultures will be drawn and antibiotics will be given.  The patient will receive Solu-Medrol and a DuoNeb for COPD.  PE is considered, although less likely.  I do not see any evidence of arrhythmia or ischemia on his EKG and his troponin is negative.  I do not see any evidence for CHF today.    Clinically, the patient had very little tolerance of exertion and O2 sats went to 84% with minimal ambulation.  The patient will be admitted for continued evaluation and treatment.  ----------------------------------------- 6:46 PM on 09/29/2018 -----------------------------------------  The patient's pH is 7.32 and he is hypercarbic at 74 with a PO2 of less than 31 on his VBG.  I have asked the respiratory therapist to place the patient on BiPAP and will plan admission at this time.  CRITICAL CARE Performed by: Eula Listen   Total critical care time: 35 minutes  Critical care time was exclusive of separately billable procedures and treating other patients.  Critical care was  necessary to treat or prevent imminent or life-threatening deterioration.  Critical care was time spent personally by me on the following activities: development of treatment plan with patient and/or surrogate as well as nursing, discussions with consultants, evaluation of patient's response to treatment, examination of patient, obtaining history from patient or surrogate, ordering and performing treatments and interventions, ordering and review of laboratory studies, ordering and review of radiographic studies, pulse oximetry and re-evaluation of patient's condition.   ____________________________________________  FINAL CLINICAL IMPRESSION(S) / ED DIAGNOSES  Final diagnoses:  COPD exacerbation ([place])  Acute on chronic respiratory failure with hypoxia and hypercapnia ([place])         NEW MEDICATIONS STARTED DURING THIS VISIT:  New Prescriptions   No medications on file      Eula Listen, MD 09/29/18 [place]    Eula Listen, MD 09/29/18 [id number]

## 2018-09-30 DIAGNOSIS — J9621 Acute and chronic respiratory failure with hypoxia: Secondary | ICD-10-CM

## 2018-09-30 DIAGNOSIS — J9622 Acute and chronic respiratory failure with hypercapnia: Secondary | ICD-10-CM

## 2018-09-30 MED ORDER — MIRTAZAPINE 15 MG PO TABS
30.0000 mg | ORAL_TABLET | Freq: Every day | ORAL | Status: DC
  Administered 2018-09-30: 22:00:00 30 mg via ORAL
  Filled 2018-09-30: qty 2

## 2018-09-30 MED ORDER — CIPROFLOXACIN HCL 500 MG PO TABS
500.0000 mg | ORAL_TABLET | ORAL | Status: DC
  Administered 2018-09-30 – 2018-10-01 (×2): 500 mg via ORAL
  Filled 2018-09-30 (×3): qty 1

## 2018-09-30 MED ORDER — SERTRALINE HCL 50 MG PO TABS
100.0000 mg | ORAL_TABLET | Freq: Every day | ORAL | Status: DC
  Administered 2018-09-30 – 2018-10-01 (×2): 100 mg via ORAL
  Filled 2018-09-30 (×2): qty 2

## 2018-09-30 MED ORDER — ASPIRIN EC 81 MG PO TBEC
81.0000 mg | DELAYED_RELEASE_TABLET | Freq: Every day | ORAL | Status: DC
  Administered 2018-09-30 – 2018-10-01 (×2): 81 mg via ORAL
  Filled 2018-09-30 (×2): qty 1

## 2018-09-30 MED ORDER — ENOXAPARIN SODIUM 40 MG/0.4ML ~~LOC~~ SOLN
40.0000 mg | SUBCUTANEOUS | Status: DC
  Filled 2018-09-30: qty 0.4

## 2018-09-30 MED ORDER — SUCRALFATE 1 GM/10ML PO SUSP
1.0000 g | Freq: Three times a day (TID) | ORAL | Status: DC
  Administered 2018-09-30 – 2018-10-01 (×4): 1 g via ORAL
  Filled 2018-09-30 (×5): qty 10

## 2018-09-30 MED ORDER — FUROSEMIDE 20 MG PO TABS
20.0000 mg | ORAL_TABLET | Freq: Every day | ORAL | Status: DC
  Administered 2018-09-30 – 2018-10-01 (×2): 20 mg via ORAL
  Filled 2018-09-30 (×2): qty 1

## 2018-09-30 MED ORDER — ARIPIPRAZOLE 5 MG PO TABS
5.0000 mg | ORAL_TABLET | Freq: Every day | ORAL | Status: DC
  Administered 2018-09-30 – 2018-10-01 (×2): 5 mg via ORAL
  Filled 2018-09-30 (×3): qty 1

## 2018-09-30 MED ORDER — LOSARTAN POTASSIUM 25 MG PO TABS
25.0000 mg | ORAL_TABLET | Freq: Every day | ORAL | Status: DC
  Administered 2018-09-30 – 2018-10-01 (×2): 25 mg via ORAL
  Filled 2018-09-30 (×2): qty 1

## 2018-09-30 MED ORDER — LORAZEPAM 1 MG PO TABS
1.0000 mg | ORAL_TABLET | Freq: Three times a day (TID) | ORAL | Status: DC | PRN
  Administered 2018-09-30 – 2018-10-01 (×2): 1 mg via ORAL
  Filled 2018-09-30 (×2): qty 1

## 2018-09-30 MED ORDER — ATORVASTATIN CALCIUM 20 MG PO TABS
40.0000 mg | ORAL_TABLET | Freq: Every day | ORAL | Status: DC
  Administered 2018-09-30 – 2018-10-01 (×2): 40 mg via ORAL
  Filled 2018-09-30 (×2): qty 2

## 2018-09-30 NOTE — Evaluation (Signed)
Physical Therapy Evaluation Patient Details Name: Leslie Sims MRN: [id number] DOB: 02-Feb-1951 Today's Date: 09/30/2018   History of Present Illness  Patient is a pleasant 67 y.o. male with a history of COPD, ongoing tobacco abuse, CAD status post MI presenting with exertional shortness of breath.  The patient reports per physician report that for the past 4 days, he has had a progressively worsening exertional shortness of breath and now can barely go to the bathroom and back to the couch without getting significantly out of breath.  He has had a nonproductive cough with a mild sore throat but no congestion or rhinorrhea.  No nausea vomiting or diarrhea.  The patient was discharged from the hospital 09/15/2018 after admission for respiratory failure with hypercarbia and hypoxia in the setting of COPD exacerbation. Patient was attending [facility] until June when he had to stop. Currently mobility is around his home without AD and out in public without AD. Does not use oxygen in his house all the time per patient report.   Clinical Impression  Patient is a pleasant 67 year old male who presents with generalized weakness and limited capacity for mobility. Patient evaluation performed concurrently with Occupational Therapy. Patient has good seated balance, standing balance is functional however declines as patient fatigues. Patient fearful and anxious throughout session. Ambulation performed without oxygen with Sp02 and other vitals monitored, patient Sp02 ranged: 94-96 with slight elevation of HR within functional measures. Patient would benefit from skilled physical therapy while in hospital to improve LE strength and capacity for mobility. Upon discharge patient will benefit from return to [place] for increasing functional capacity of mobility.     Follow Up Recommendations Other (comment)([facility])    Equipment Recommendations  (shower chair)    Recommendations for Other Services        Precautions / Restrictions Precautions Precautions: None Restrictions Weight Bearing Restrictions: No Other Position/Activity Restrictions: requires cueing for breathing       Mobility  Bed Mobility Overal bed mobility: Independent             General bed mobility comments: In and out of bed without any assistance  Transfers Overall transfer level: Independent(No LOB)               General transfer comment: Patient performs sit to stand independently without UE support   Ambulation/Gait Ambulation/Gait assistance: Supervision Gait Distance (Feet): 100 Feet   Gait Pattern/deviations: Step-through pattern;Narrow base of support   Gait velocity interpretation: >2.62 ft/sec, indicative of community ambulatory General Gait Details: Patient ambulates with narrow base of support and limited arm swing. Ambulates with functional velocity. Ambulated without 02 with Sp02 levels ranging from 94% to 96%.   Stairs            Wheelchair Mobility    Modified Rankin (Stroke Patients Only)       Balance Overall balance assessment: Independent(No LOB) Sitting-balance support: Feet unsupported;No upper extremity supported Sitting balance-Leahy Scale: Normal Sitting balance - Comments: maintain against resistance    Standing balance support: No upper extremity supported Standing balance-Leahy Scale: Fair Standing balance comment: able to maintain COM against resistance without LOB, ambulates with supervision/ CGA for monitoring of vitals                              Pertinent Vitals/Pain Pain Assessment: No/denies pain    Home Living Family/patient expects to be discharged to:: Private residence Living Arrangements: Children Available Help  at Discharge: Family;Friend(s);Available PRN/intermittently Type of Home: Mobile home Home Access: Stairs to enter Entrance Stairs-Rails: Right;Left;Can reach both Entrance Stairs-Number of Steps: 4 Home Layout:  One level Home Equipment: Cane - quad Additional Comments: Patient does not use any devices to walk around his house.     Prior Function Level of Independence: Independent         Comments: Ambulated independently in public, ind with ADLs, no falls in last 6 mo.      Hand Dominance        Extremity/Trunk Assessment   Upper Extremity Assessment Upper Extremity Assessment: Overall WFL for tasks assessed    Lower Extremity Assessment Lower Extremity Assessment: Defer to PT evaluation RLE Deficits / Details: 4/5 gross RLE Sensation: WNL RLE Coordination: WNL LLE Deficits / Details: 4/5 gross LLE Sensation: WNL LLE Coordination: WNL    Cervical / Trunk Assessment Cervical / Trunk Assessment: Normal  Communication   Communication: No difficulties  Cognition Arousal/Alertness: Awake/alert Behavior During Therapy: Anxious Overall Cognitive Status: Within Functional Limits for tasks assessed                                 General Comments: Patient anxious, repeating that he has problems with anxiety and depression frequently during session       General Comments General comments (skin integrity, edema, etc.): no clubbing, changing of color of skin, or open wounds noted.     Exercises Other Exercises Other Exercises: standing balance next to bed: able to maintain COM against resistance Other Exercises: ambulate around IC unit with CGA: good velocity and maintain 02 levels >94% for entire walk.    Assessment/Plan    PT Assessment Patient needs continued PT services  PT Problem List Decreased strength;Decreased activity tolerance;Decreased balance;Decreased mobility;Cardiopulmonary status limiting activity       PT Treatment Interventions Gait training;Stair training;Functional mobility training;Neuromuscular re-education;Balance training;Therapeutic exercise;Therapeutic activities;Patient/family education    PT Goals (Current goals can be found in the  Care Plan section)  Acute Rehab PT Goals Patient Stated Goal: Get back home , breathing better and back into [facility]  PT Goal Formulation: With patient Time For Goal Achievement: 10/14/18 Potential to Achieve Goals: Good    Frequency Min 2X/week   Barriers to discharge        Co-evaluation               AM-PAC PT "6 Clicks" Daily Activity  Outcome Measure Difficulty turning over in bed (including adjusting bedclothes, sheets and blankets)?: None Difficulty moving from lying on back to sitting on the side of the bed? : None Difficulty sitting down on and standing up from a chair with arms (e.g., wheelchair, bedside commode, etc,.)?: None Help needed moving to and from a bed to chair (including a wheelchair)?: None Help needed walking in hospital room?: None Help needed climbing 3-5 steps with a railing? : A Little 6 Click Score: 23    End of Session Equipment Utilized During Treatment: Gait belt Activity Tolerance: Patient tolerated treatment well Patient left: in bed Nurse Communication: Mobility status PT Visit Diagnosis: Unsteadiness on feet (R26.81);Other abnormalities of gait and mobility (R26.89);Muscle weakness (generalized) (M62.81)    Time: 1210-1230 PT Time Calculation (min) (ACUTE ONLY): 20 min   Charges:   PT Evaluation $PT Eval Low Complexity: [place], PT, DPT   Janna Arch 09/30/2018, 12:53  PM   

## 2018-09-30 NOTE — Progress Notes (Addendum)
Leslie Sims at [place] NAME: Leslie Sims    MR#:  [id number]  DATE OF BIRTH:  20-Feb-1951  SUBJECTIVE: Admitted for respiratory failure due to COPD patient required BiPAP.  Patient is off the BiPAP since morning and he says he is feeling much better than before, decreased shortness of breath, cough.  CHIEF COMPLAINT:   Chief Complaint  Patient presents with  . Shortness of Breath    REVIEW OF SYSTEMS:   ROS CONSTITUTIONAL: No fever, fatigue or weakness.  EYES: No blurred or double vision.  EARS, NOSE, AND THROAT: No tinnitus or ear pain.  RESPIRATORY: Decreased shortness of breath, cough.[name]  CARDIOVASCULAR: No chest pain, orthopnea, edema.  GASTROINTESTINAL: No nausea, vomiting, diarrhea or abdominal pain.  GENITOURINARY: No dysuria, hematuria.  ENDOCRINE: No polyuria, nocturia,  HEMATOLOGY: No anemia, easy bruising or bleeding SKIN: No rash or lesion. MUSCULOSKELETAL: No joint pain or arthritis.   NEUROLOGIC: No tingling, numbness, weakness.  PSYCHIATRY: No anxiety or depression.   DRUG ALLERGIES:   Allergies  Allergen Reactions  . Prozac [Fluoxetine Hcl] Shortness Of Breath  . Effexor Xr [Venlafaxine Hcl Er] Other (See Comments)    "Makes me feel funny"  . Wellbutrin [Bupropion] Other (See Comments)    "Makes me feel funny"    VITALS:  Blood pressure 112/77, pulse 75, temperature 97.8 F (36.6 C), temperature source Oral, resp. rate 15, height 5\' 10"  (1.778 m), weight 65.3 kg, SpO2 100 %.  PHYSICAL EXAMINATION:  GENERAL:  67 y.o.-year-old patient lying in the bed with no acute distress.  EYES: Pupils equal, round, reactive to light and accommodation. No scleral icterus. Extraocular muscles intact.  HEENT: Head atraumatic, normocephalic. Oropharynx and nasopharynx clear.  NECK:  Supple, no jugular venous distention. No thyroid enlargement, no tenderness.  LUNGS: Diminished breath sounds in the left upper lung  field, faint wheezing in the right lower lobe but otherwise clear and the rest of the areas.   CARDIOVASCULAR: S1, S2 normal. No murmurs, rubs, or gallops.  ABDOMEN: Soft, nontender, nondistended. Bowel sounds present. No organomegaly or mass.  EXTREMITIES: No pedal edema, cyanosis, or clubbing.  NEUROLOGIC: Cranial nerves II through XII are intact. Muscle strength 5/5 in all extremities. Sensation intact. Gait not checked.  PSYCHIATRIC: The patient is alert and oriented x 3.  SKIN: No obvious rash, lesion, or ulcer.    LABORATORY PANEL:   CBC Recent Labs  Lab 09/29/18 1723  WBC 10.9*  HGB 15.0  HCT 46.7  PLT 250   ------------------------------------------------------------------------------------------------------------------  Chemistries  Recent Labs  Lab 09/29/18 1723  NA 140  K 3.9  CL 98  CO2 36*  GLUCOSE 109*  BUN 9  CREATININE 0.73  CALCIUM 9.4   ------------------------------------------------------------------------------------------------------------------  Cardiac Enzymes Recent Labs  Lab 09/29/18 1723  TROPONINI <0.03   ------------------------------------------------------------------------------------------------------------------  RADIOLOGY:  Dg Chest 2 View  Result Date: 09/29/2018 CLINICAL DATA:  Increasing dyspnea and cough x4 days EXAM: CHEST - 2 VIEW COMPARISON:  09/13/2018 CT and CXR FINDINGS: Emphysematous hyperinflation of the lungs. Normal heart size with minimal aortic atherosclerosis. Stable 1 cm nodule projects over the left upper lobe. No pulmonary consolidation or overt pulmonary edema. No effusion. Mild anterior eventration of the left hemidiaphragm with bowel projecting beneath it as before. No aggressive osseous lesions. Calcified lymph node in the right axilla. IMPRESSION: COPD with minimal aortic atherosclerosis. No active pulmonary disease. Calcified nodule left upper lobe measuring approximate 1 cm. Other ancillary findings as  above. Electronically Signed   By: Ashley Royalty M.D.   On: 09/29/2018 18:03    EKG:   Orders placed or performed during the hospital encounter of 09/29/18  . ED EKG within 10 minutes  . ED EKG within 10 minutes    ASSESSMENT AND PLAN:  Acute on chronic respiratory failure with hypoxia and hypercapnia due to COPD exacerbation: Patient is off the BiPAP, now on oxygen.  He is feeling much better, transfer the patient from ICU to regular floor.  Telemetry, continue IV steroids, bronchodilators, empiric antibiotics.  Patient may benefit from BiPAP as per my attending.  Advised the patient to quit smoking. 2. chronic systolic heart failure.   3. left upper lobe nodule patient closely for surveillance 4.  History of CAD: No chest pain continue home medicines.  More than 50% time spent in counseling, coordination of care   All the records are reviewed and case discussed with Care Management/Social Workerr. Management plans discussed with the patient, family and they are in agreement.  CODE STATUS: Full code  TOTAL TIME TAKING CARE OF THIS PATIENT: 38 minutes.   POSSIBLE D/C IN 1-2DAYS, DEPENDING ON CLINICAL CONDITION.   Epifanio Lesches M.D on 09/30/2018 at 10:40 AM  Between 7am to 6pm - Pager - [phone]  After 6pm go to [email or web address] - password [email or web address] [facility]  Office  [phone]  CC: Primary care physician; [name], MD   Note: This dictation was prepared with Dragon dictation along with smaller phrase technology. Any transcriptional errors that result from this process are unintentional.

## 2018-09-30 NOTE — Evaluation (Signed)
Occupational Therapy Evaluation Patient Details Name: Leslie Sims [facility] MRN: [id number] DOB: 1951-04-25 Today's Date: 09/30/2018    History of Present Illness Patient is a pleasant 67 y.o. male with a history of COPD, ongoing tobacco abuse, CAD status post MI presenting with exertional shortness of breath.  The patient reports per physician report that for the past 4 days, he has had a progressively worsening exertional shortness of breath and now can barely go to the bathroom and back to the couch without getting significantly out of breath.  He has had a nonproductive cough with a mild sore throat but no congestion or rhinorrhea.  No nausea vomiting or diarrhea.  The patient was discharged from the hospital 09/15/2018 after admission for respiratory failure with hypercarbia and hypoxia in the setting of COPD exacerbation. Patient was attending [facility] until June when he had to stop. Currently mobility is around his home without AD and out in public without AD. Does not use oxygen in his house all the time per patient report.    Clinical Impression   Pt present with OT eval with UE strength and AROM WNL - balance sitting and standing good with no LOB - functional mobility  Show no LOB - and able to do ADL's - but limited by endurance and act tolerance -pt on  2 L of oxygen - but do not wear it all the time - pt very anxious - oxygen levels stayed up but HR increase - pt was on June doing [facility] and did very well - benefit from it - recommend strongly for pt to do again. Pt do report his insurance are changing and he and his daughter has meeting setup - pt advice about shower setup - to get shower chair to sit on and hand held shower  - to decrease risk for falls .  No OT indicated in this setting     Follow Up Recommendations  No OT follow up    Equipment Recommendations  Tub/shower bench(hand held shower)    Recommendations for Other Services (Lung work referral - he done it in June  19)     Precautions / Restrictions Precautions Precautions: None Restrictions Weight Bearing Restrictions: No Other Position/Activity Restrictions: requires cueing for breathing       Mobility Bed Mobility Overal bed mobility: Independent             General bed mobility comments: In and out of bed without any assistance  Transfers Overall transfer level: Independent(No LOB)               General transfer comment: Patient performs sit to stand independently without UE support     Balance Overall balance assessment: Independent(No LOB) Sitting-balance support: Feet unsupported;No upper extremity supported Sitting balance-Leahy Scale: Normal Sitting balance - Comments: maintain against resistance    Standing balance support: No upper extremity supported Standing balance-Leahy Scale: Fair Standing balance comment: able to maintain COM against resistance without LOB, ambulates with supervision/ CGA for monitoring of vitals                            ADL either performed or assessed with clinical judgement   ADL Overall ADL's : Independent                                       General ADL Comments: but  need several rest breaks      Vision Baseline Vision/History: Wears glasses(reading)       Perception     Praxis      Pertinent Vitals/Pain Pain Assessment: No/denies pain     Hand Dominance     Extremity/Trunk Assessment Upper Extremity Assessment Upper Extremity Assessment: Overall WFL for tasks assessed   Lower Extremity Assessment Lower Extremity Assessment: Defer to PT evaluation RLE Deficits / Details: 4/5 gross RLE Sensation: WNL RLE Coordination: WNL LLE Deficits / Details: 4/5 gross LLE Sensation: WNL LLE Coordination: WNL   Cervical / Trunk Assessment Cervical / Trunk Assessment: Normal   Communication Communication Communication: No difficulties   Cognition Arousal/Alertness: Awake/alert Behavior  During Therapy: Anxious Overall Cognitive Status: Within Functional Limits for tasks assessed                                 General Comments: Patient anxious, repeating that he has problems with anxiety and depression frequently during session    General Comments  no clubbing, changing of color of skin, or open wounds noted.     Exercises Other Exercises Other Exercises: standing balance next to bed: able to maintain COM against resistance Other Exercises: ambulate around IC unit with CGA: good velocity and maintain 02 levels >94% for entire walk.    Shoulder Instructions      Home Living Family/patient expects to be discharged to:: Private residence Living Arrangements: Children Available Help at Discharge: Family;Friend(s);Available PRN/intermittently Type of Home: Mobile home Home Access: Stairs to enter Entrance Stairs-Number of Steps: 4 Entrance Stairs-Rails: Right;Left;Can reach both Home Layout: One level     Bathroom Shower/Tub: Tub/shower unit(Recommend shower chair and hand held shower )   Bathroom Toilet: Standard     Home Equipment: Cane - quad   Additional Comments: Patient does not use any devices to walk around his house.       Prior Functioning/Environment Level of Independence: Independent        Comments: Ambulated independently in public, ind with ADLs, no falls in last 6 mo.         OT Problem List:        OT Treatment/Interventions:      OT Goals(Current goals can be found in the care plan section) Acute Rehab OT Goals Patient Stated Goal: Get back home , breathing better and back into Lung work  OT Goal Formulation: With patient Potential to Achieve Goals: Good  OT Frequency:     Barriers to D/C:            Co-evaluation              AM-PAC PT "6 Clicks" Daily Activity     Outcome Measure                 End of Session    Activity Tolerance: Patient tolerated treatment well;Patient limited by  fatigue(and anxiety - HR increase ) Patient left: in bed                   Time: 1210-1230 OT Time Calculation (min): 20 min Charges:  OT General Charges $OT Visit: 1 Visit OT Evaluation $OT Eval Low Complexity: 1 Low    [name] OTR/L,CLT 09/30/2018, 12:48 PM

## 2018-09-30 NOTE — Progress Notes (Signed)
[facility], pt transferred via wc with no issues.

## 2018-09-30 NOTE — Consult Note (Signed)
[facility] Consultation      Date: 09/30/2018,   MRN# [id number] Leslie Sims [facility] 16-Nov-1951 Code Status:  Code Status History    Date Active Date Inactive Code Status Order ID Comments User Context   09/13/2018 1949 09/15/2018 1719 Full Code [id number]  Leslie Grayer, MD ED   08/25/2018 0140 08/26/2018 1737 Full Code [id number]  Leslie Foreman, MD Inpatient   07/02/2018 1903 07/03/2018 2216 Full Code [id number]  Leslie Bow, MD ED   12/26/2017 0129 12/30/2017 2106 Full Code [id number]  Leslie Jo, MD Inpatient     Hosp day:@LENGTHOFSTAYDAYS @ Referring MD: @ATDPROV @     PCP:      AdmissionWeight: 65.3 kg                 CurrentWeight: 65.3 kg Leslie Sims is a 67 y.o. old male seen in consultation for pulm/cc  at the request of Dr Leslie Sims, Leslie Sims     CHIEF COMPLAINT:   Sob/resp fialure   HISTORY OF PRESENT ILLNESS  67 y/o man with h/o copd and on going smoking admitted yesterday with increasing sob for last 4 to 5 days/ / sob with minimal exertion associated with cough without sputum fever or chills. He was treated with o2 , iv steroids and bronchodilators with minimal improvement. He later required bipap and was transferred to the step down. He is feeling better and is less sob.no hemoptysis or CP this am. He feels hungry and is getting ready to eat his breakfast.he is unsure about the use of BIPAP on regular basis.    PAST MEDICAL HISTORY   Past Medical History:  Diagnosis Date  . Allergic rhinitis   . Anxiety   . Back pain   . Colon polyps 08/2012   colonoscopy; multiple colon polyps; repeat colonoscopy in one year.  [name] COPD (chronic obstructive pulmonary disease) ([place])   . Coronary artery disease 11/2009   a. late presenting ant MI; b. LHC 100% pLAD s/p PCI/DES, 99% mRCA s/p PCI/DES, EF 35%; c. nuclear stress test 05/13: prior ant/inf infarcts w/o ischemia, EF 43%; d. LHC 02/14: widely patent stents with no other obs dz, EF 40%; e. LHC  11/17: LM nl, mLAD 10%, patent LAD stent, dLAD 20%, p-mRCA 10%, mRCA 40%, patent RCA stent, EF 35-45%   . Depression   . GERD (gastroesophageal reflux disease)   . Headache(784.0)   . Helicobacter pylori (H. pylori)   . Hemorrhoids   . Hernia    inguinal  . Hyperlipidemia   . Hypertension   . Insomnia   . Ischemic cardiomyopathy   . Myalgia   . Peptic ulcer   . Pulmonary nodules   . ST elevation (STEMI) myocardial infarction involving left anterior descending coronary artery (Leslie Sims) 11/2009   a. s/p PCI to the LAD  . Status post dilation of esophageal narrowing 2000  . Tinea pedis   . Wears dentures    partial upper     SURGICAL HISTORY   Past Surgical History:  Procedure Laterality Date  . Admission  12/20/2012   COPD exacerbation.  [place].  [name] CARDIAC CATHETERIZATION    . CARDIAC CATHETERIZATION  02/05/13   [facility]  . CARDIAC CATHETERIZATION  10/14   [facility] : patent stents with no change in anatomy. EF: 40$  . CARDIAC CATHETERIZATION Left 11/12/2016   Procedure: Left Heart Cath and Coronary Angiography;  Surgeon: Leslie Hampshire, MD;  Location: [facility];  Service: Cardiovascular;  Laterality: Left;  .  COLONOSCOPY    . COLONOSCOPY WITH PROPOFOL N/A 05/18/2016   Procedure: COLONOSCOPY WITH PROPOFOL;  Surgeon: Leslie Lame, MD;  Location: [facility] ENDOSCOPY;  Service: Endoscopy;  Laterality: N/A;  . CORONARY ANGIOPLASTY WITH STENT PLACEMENT  09/2009   LAD 3.0 X23 mm Xience DES, RCA: 4.0 X 15 mm Xience DES  . ELECTROMAGNETIC NAVIGATION BROCHOSCOPY N/A 10/27/2017   Procedure: ELECTROMAGNETIC NAVIGATION BRONCHOSCOPY;  Surgeon: Leslie Lipps, MD;  Location: [facility] ORS;  Service: Cardiopulmonary;  Laterality: N/A;  . ESOPHAGEAL DILATION    . ESOPHAGOGASTRODUODENOSCOPY  2008  . ESOPHAGOGASTRODUODENOSCOPY  08/20/2012  . HERNIA REPAIR  07/20/2012   L inguinal hernia repair  . PENILE PROSTHESIS IMPLANT       FAMILY HISTORY   Family History  Problem Relation Age of Onset  . Heart  attack Brother        Brother #1  . Diabetes Brother   . Hypertension Brother        #3  . Coronary artery disease Father 30       deceased  . Heart attack Father   . Diabetes Father   . Heart disease Father   . COPD Mother 7       deceased  . Alcohol abuse Sister        polysubstance abuse  . COPD Sister   . Lung cancer Sister   . Alcohol abuse Sister        polysubstance abuse  . Penile cancer Brother   . Diabetes Brother      SOCIAL HISTORY   Social History   Tobacco Use  . Smoking status: Current Every Day Smoker    Packs/day: 0.50    Years: 42.00    Pack years: 21.00    Types: Cigarettes  . Smokeless tobacco: Never Used  Substance Use Topics  . Alcohol use: No    Alcohol/week: 0.0 standard drinks  . Drug use: No     MEDICATIONS    Home Medication:  Prior to Admission medications   Medication Sig Start Date End Date Taking? Authorizing Provider  acetaminophen (TYLENOL) 325 MG tablet Take 2 tablets (650 mg total) by mouth every 6 (six) hours as needed for mild pain (or Fever >/= 101). 08/26/18  Yes Gouru, Aruna, MD  albuterol (VENTOLIN HFA) 108 (90 Base) MCG/ACT inhaler USE 2 INHALATIONS EVERY 6 HOURS AS NEEDED FOR WHEEZING 08/19/17  Yes Leslie Honour, MD  ARIPiprazole (ABILIFY) 5 MG tablet Take 5 mg by mouth daily. 07/26/18  Yes [provider]  aspirin 81 MG tablet Take 81 mg by mouth daily.     Yes [provider]  atorvastatin (LIPITOR) 40 MG tablet Take 1 tablet (40 mg total) by mouth daily. 03/03/18  Yes Leslie Honour, MD  Fluticasone-Salmeterol (ADVAIR DISKUS) 500-50 MCG/DOSE AEPB Inhale 1 puff into the lungs 2 (two) times daily. 07/27/17  Yes Leslie Honour, MD  furosemide (LASIX) 20 MG tablet Take 1 tablet (20 mg total) by mouth daily. 07/07/18 09/30/19 Yes Leslie Honour, MD  hydrOXYzine (VISTARIL) 25 MG capsule Take 25 mg by mouth 2 (two) times daily as needed. 06/05/18  Yes [provider]  ipratropium-albuterol  (DUONEB) 0.5-2.5 (3) MG/3ML SOLN Take 3 mLs by nebulization every 4 (four) hours as needed. 06/24/18  Yes Leslie Honour, MD  LORazepam (ATIVAN) 1 MG tablet Take 1 tablet (1 mg total) by mouth every 8 (eight) hours as needed for anxiety. 07/07/18  Yes Leslie Honour, MD  losartan Blanchard Kelch)  25 MG tablet TAKE 1 TABLET DAILY 01/27/18  Yes Gollan, Kathlene November, MD  mirtazapine (REMERON) 30 MG tablet Take 1 tablet (30 mg total) by mouth at bedtime. 06/24/18  Yes Leslie Honour, MD  Multiple Vitamin (MULTIVITAMIN) capsule Take 1 capsule by mouth daily.   Yes [provider]  nitroGLYCERIN (NITROSTAT) 0.4 MG SL tablet Place 1 tablet (0.4 mg total) under the tongue every 5 (five) minutes as needed. 10/17/17  Yes Leslie Hampshire, MD  sertraline (ZOLOFT) 100 MG tablet Take 100 mg by mouth daily.  06/28/18  Yes [provider]  SPIRIVA HANDIHALER 18 MCG inhalation capsule PLACE 1 CAPSULE INTO INHALER AND INHALE THE CONTENTS DAILY 07/26/18  Yes Carles Collet M, PA-C  sucralfate (CARAFATE) 1 GM/10ML suspension Take 10 mLs (1 g total) by mouth 4 (four) times daily -  with meals and at bedtime. Patient taking differently: Take 1 g by mouth 4 (four) times daily as needed.  05/16/18  Yes Leslie Honour, MD  predniSONE (DELTASONE) 20 MG tablet Take 2 tablets (40 mg total) by mouth daily with breakfast. Patient not taking: Reported on 09/29/2018 09/15/18   Demetrios Loll, MD      Current Medication:  Current Facility-Administered Medications:  .  albuterol (PROVENTIL) (2.5 MG/3ML) 0.083% nebulizer solution 2.5 mg, 2.5 mg, Nebulization, Q2H PRN, Gouru, Aruna, MD .  ciprofloxacin (CIPRO) tablet 500 mg, 500 mg, Oral, BID, Gouru, Aruna, MD .  ipratropium-albuterol (DUONEB) 0.5-2.5 (3) MG/3ML nebulizer solution 3 mL, 3 mL, Nebulization, Q6H, Gouru, Aruna, MD, 3 mL at 09/30/18 0747 .  methylPREDNISolone sodium succinate (SOLU-MEDROL) 125 mg/2 mL injection 60 mg, 60 mg, Intravenous, Q6H, 60 mg at  09/30/18 0356 **FOLLOWED BY** [START ON 10/01/2018] predniSONE (DELTASONE) tablet 40 mg, 40 mg, Oral, Q breakfast, Gouru, Aruna, MD .  nicotine (NICODERM CQ - dosed in mg/24 hours) patch 21 mg, 21 mg, Transdermal, Daily, Gouru, Aruna, MD    ALLERGIES   Prozac [fluoxetine hcl]; Effexor xr [venlafaxine hcl er]; and Wellbutrin [bupropion]     REVIEW OF SYSTEMS    Review of Systems:  Gen:  Denies  fever, sweats, chills weigh loss  HEENT: Denies blurred vision, double vision, ear pain, eye pain, hearing loss, nose bleeds, sore throat Cardiac:  No dizziness, chest pain or heaviness, chest tightness,edema Resp:  See HPI Gi: Denies swallowing difficulty, stomach pain, nausea or vomiting, diarrhea, constipation, bowel incontinence Gu:  Denies bladder incontinence, burning urine Ext:   Denies Joint pain, stiffness or swelling Skin: Denies  skin rash, easy bruising or bleeding or hives Endoc:  Denies polyuria, polydipsia , polyphagia or weight change Psych:   Denies depression, insomnia or hallucinations      VS: BP 112/77   Pulse 75   Temp 97.8 F (36.6 C) (Oral)   Resp 15   Ht 5\' 10"  (1.778 m)   Wt 65.3 kg   SpO2 100%   BMI 20.66 kg/m      PHYSICAL EXAM  Physical Examination:   GENERAL:NAD, no fevers, chills, no weakness no fatigue HEAD: Normocephalic, atraumatic.  EYES: Pupils equal, round, reactive to light. Extraocular muscles intact. No scleral icterus.  MOUTH: Moist mucosal membrane. Dentition intact. No abscess noted.  EAR, NOSE, THROAT: Clear without exudates. No external lesions.  NECK: Supple. No thyromegaly. No nodules. No JVD.  PULMONARY: Diffuse coarse rhonchi right sided +wheezes CARDIOVASCULAR: S1 and S2. Regular rate and rhythm. No murmurs, rubs, or gallops. No edema. Pedal pulses 2+ bilaterally.  GASTROINTESTINAL: Soft,  nontender, nondistended. No masses. Positive bowel sounds. No hepatosplenomegaly.  MUSCULOSKELETAL: No swelling, clubbing, or edema.  Range of motion full in all extremities.  NEUROLOGIC: Cranial nerves II through XII are intact. No gross focal neurological deficits. Sensation intact. Reflexes intact.  SKIN: No ulceration, lesions, rashes, or cyanosis. Skin warm and dry. Turgor intact.  PSYCHIATRIC: Mood, affect within normal limits. The patient is awake, alert and oriented x 3. Insight, judgment intact.  ALL OTHER ROS ARE NEGATIVE     IMAGING    Dg Chest 2 View  Result Date: 09/29/2018 CLINICAL DATA:  Increasing dyspnea and cough x4 days EXAM: CHEST - 2 VIEW COMPARISON:  09/13/2018 CT and CXR FINDINGS: Emphysematous hyperinflation of the lungs. Normal heart size with minimal aortic atherosclerosis. Stable 1 cm nodule projects over the left upper lobe. No pulmonary consolidation or overt pulmonary edema. No effusion. Mild anterior eventration of the left hemidiaphragm with bowel projecting beneath it as before. No aggressive osseous lesions. Calcified lymph node in the right axilla. IMPRESSION: COPD with minimal aortic atherosclerosis. No active pulmonary disease. Calcified nodule left upper lobe measuring approximate 1 cm. Other ancillary findings as above. Electronically Signed   By: Ashley Royalty M.D.   On: 09/29/2018 18:03   Dg Chest 2 View  Result Date: 09/13/2018 CLINICAL DATA:  Shortness of breath and cough EXAM: CHEST - 2 VIEW COMPARISON:  Chest CT June 01, 2018; chest radiograph August 24, 2018 FINDINGS: There is a 1 cm nodular opacity in the left upper lobe, stable from prior CT and recent chest radiograph. There are scattered areas of scarring in both lungs with underlying emphysematous change, better delineated on prior CT. There is no edema or consolidation. The heart size is normal. Pulmonary vascularity is stable and reflects underlying emphysematous change. No evident adenopathy. There is aortic atherosclerosis. No bone lesions. IMPRESSION: Underlying emphysematous changes. Scattered areas of scarring. 1 cm  pulmonary nodular lesion left upper lobe, stable. No edema or consolidation. Stable cardiac silhouette.  There is aortic atherosclerosis. Aortic Atherosclerosis (ICD10-I70.0) and Emphysema (ICD10-J43.9). Electronically Signed   By: Lowella Grip III M.D.   On: 09/13/2018 13:26   Ct Angio Chest Pe W And/or Wo Contrast  Result Date: 09/13/2018 CLINICAL DATA:  67 year old with 5 day history of shortness of breath and nonproductive cough. Patient was seen at an urgent care on Saturday and was started on antibiotic therapy and corticosteroids without interval improvement in symptoms. EXAM: CT ANGIOGRAPHY CHEST WITH CONTRAST TECHNIQUE: Multidetector CT imaging of the chest was performed using the standard protocol during bolus administration of intravenous contrast. Multiplanar CT image reconstructions and MIPs were obtained to evaluate the vascular anatomy. CONTRAST:  73mL OMNIPAQUE IOHEXOL 350 MG/ML IV. COMPARISON:  CT/CTA chest 06/01/2018, 04/27/2018, 02/01/2018 and earlier. PET-CT 10/13/2017. FINDINGS: Cardiovascular: Contrast opacification of pulmonary arteries is excellent. No filling defects within either main pulmonary artery or their segmental branches in either lung to suggest pulmonary embolism. Normal heart size with likely LEFT ventricular hypertrophy. No significant pericardial effusion. RIGHT coronary artery stent. Moderate LAD coronary atherosclerosis. Moderate atherosclerosis involving the thoracic and proximal abdominal aorta without evidence of aneurysm or dissection. Proximal great vessels widely patent. Celiac artery and SUPERIOR mesenteric artery widely patent at their origins. Mediastinum/Nodes: No pathologically enlarged mediastinal, hilar or axillary lymph nodes. No mediastinal masses. Normal-appearing esophagus. Normal-appearing thyroid gland. Lungs/Pleura: Severe emphysematous changes throughout both lungs as noted previously. In the POSTERIOR LEFT UPPER LOBE there is a 10 x 17 mm nodule  (14 mm mean  diameter) on image 31 of series 6. Even though this nodule was hypermetabolic on the prior PET-CT, it has not significantly changed in size dating back to October, 2018. In the RIGHT UPPER LOBE there is a 10 x 15 mm nodule (13 mm mean diameter) on image 31 of series 6. This nodule is associated with linear scarring and has decreased in size since October, 2018. It demonstrated only low level metabolic activity on the prior PET-CT. In the SUPERIOR segment LEFT LOWER LOBE there is a 3 x 7 mm nodule (5 mm mean diameter) on image 47 of series 6. This nodule is unchanged dating back to the October, 2018 CT. A 3 mm nodule in the LEFT lung apex is associated with parenchymal scarring and is also unchanged dating back to the October, 2018 CT. No new or enlarging nodules in either lung. Scattered areas of parenchymal scarring in both lungs. No confluent airspace consolidation. No pleural effusions. No ground-glass lung opacities. Upper Abdomen: Stable 2.9 x 1.3 cm adenoma involving the RIGHT adrenal gland (Hounsfield measurement -15) and stable 3.2 x 2.7 cm adenoma involving the LEFT adrenal gland (Hounsfield measurement 0). Upper abdomen otherwise unremarkable for the early arterial phase of enhancement. 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 No acute findings. Review of the MIP images confirms the above findings. IMPRESSION: 1. No evidence of pulmonary embolism. 2.  No acute cardiopulmonary disease. 3. BILATERAL lung nodules as described above, all of which are either stable or decreased in size since October, 2018. (This despite the fact that the LEFT UPPER LOBE lung nodule was hypermetabolic on prior PET-CT). No new or enlarging lung nodules. 4. Stable BILATERAL adrenal adenomas Aortic Atherosclerosis (ICD10-I70.0) and Emphysema (ICD10-J43.9). Electronically Signed   By: Evangeline Dakin M.D.   On: 09/13/2018 17:48   Labs reviewed. ABG venous 7.32/74 po2  ? Blood c/s no growth so far Na/ k 140/3.9  Cl/co2 98/36  Bun/cr 9/0.73  H&h15/46.7   wbc 10.9  plt 250     ASSESSMENT/PLAN  #Acuteon chronic  Hypoxic/hypercapnic  respiratory failure secondary to COPD exacerbation  Off  BiPAP on 2 lo2 without any resp distress with sats of around 97%  continue 60 mg IV every 6 hours Bronchodilator treatments with albuterol every 4 hours as needed and duo nebs every 6 hours  smoking cessation discussed  continue ciprofloxacin as an empiric antibiotic for 3 to 5 days considr nocturnal bipap on regular basis here and as outpt. He is unsure if could tolerate it  #Chronic systolic congestive heart failure No fluid overload at this time Continue close monitoring Monitor intake and output and daily weights restrat po meds on floor  #Left upper lobe nodule Follow-up with primary care physician for surveillance  #Coronary artery disease Patient is a symptomatic Denies any chest pain  p.o. medications to be restarted  #Tobacco abuse disorder Counseled patient to quit smoking for 5 minutes.  Patient verbalized understanding agreement with nicotine patch  H/o HTN  Patient/Family are satisfied with Plan of action and management. All questions answered D/w Hospitalist. Transfer to floor Time spent 50 minutes  Rosine Door, MD  09/30/2018 9:15 AM [facility]

## 2018-10-01 MED ORDER — ENSURE ENLIVE PO LIQD: 237.0000 mL | Freq: Three times a day (TID) | ORAL | 12 refills | Status: DC

## 2018-10-01 MED ORDER — ENSURE ENLIVE PO LIQD
237.0000 mL | Freq: Three times a day (TID) | ORAL | Status: DC
  Administered 2018-10-01: 237 mL via ORAL

## 2018-10-01 MED ORDER — CIPROFLOXACIN HCL 500 MG PO TABS: 500.0000 mg | ORAL_TABLET | Freq: Two times a day (BID) | ORAL | 0 refills | Status: DC

## 2018-10-01 MED ORDER — PREDNISONE 10 MG (21) PO TBPK: ORAL_TABLET | ORAL | 0 refills | Status: DC

## 2018-10-01 MED ORDER — ADULT MULTIVITAMIN W/MINERALS CH
1.0000 | ORAL_TABLET | Freq: Every day | ORAL | Status: DC
  Administered 2018-10-01: 1 via ORAL
  Filled 2018-10-01: qty 1

## 2018-10-01 NOTE — Progress Notes (Signed)
Patient resting well in bed, watching television, not in any form of distress. Receiving supplemental oxygen at 2LPM/[place]. Due medicines including breathing treatment administered. At around 2300, patient was hooked up to BiPAP. As of the moment patient is resting well. We will continue to monitor.

## 2018-10-01 NOTE — Progress Notes (Signed)
Initial Nutrition Assessment  DOCUMENTATION CODES:   Not applicable  INTERVENTION:   Ensure Enlive po BID, each supplement provides 350 kcal and 20 grams of protein  MVI daily   NUTRITION DIAGNOSIS:   Increased nutrient needs related to catabolic illness(COPD, CHF ) as evidenced by increased estimated needs.  GOAL:   Patient will meet greater than or equal to 90% of their needs  MONITOR:   PO intake, Supplement acceptance, Labs, Weight trends, Skin, I & O's  REASON FOR ASSESSMENT:   Consult Assessment of nutrition requirement/status  ASSESSMENT:   67 y.o. male with a history of COPD on 2 L nasal cannula at night, ongoing tobacco abuse, CAD status post MI presenting with exertional shortness of breath.   Unable to see patient today. Per chart, pt eating 100% of meals on 9/27 during last admit. Per chart, pt is weight stable. RD will add supplements to help pt meet his estimated needs. RD will obtain nutrition related history and exam at follow up.   Medications reviewed and include: aspirin, ciprofloxacin, lovenoc, lasix, remeron, nicotine, carafate   Labs reviewed:   Diet Order:   Diet Order            Diet regular Room service appropriate? Yes; Fluid consistency: Thin  Diet effective now             EDUCATION NEEDS:   Not appropriate for education at this time  Skin:  Skin Assessment: Reviewed RN Assessment  Last BM:  10/11  Height:   Ht Readings from Last 1 Encounters:  09/30/18 5\' 10"  (1.778 m)    Weight:   Wt Readings from Last 1 Encounters:  09/30/18 62.8 kg    Ideal Body Weight:  75.45 kg  BMI:  Body mass index is 19.87 kg/m.  Estimated Nutritional Needs:   Kcal:  1900-2200kcal/day   Protein:  98-111g/day   Fluid:  >1.9L/day   Koleen Distance MS, RD, LDN Pager #- [phone] Office#- [phone] After Hours Pager: [phone]

## 2018-10-01 NOTE — Progress Notes (Signed)
Patient is doing much better, no shortness of breath or wheezing and wants to go home and stable for discharge, discharge instructions on the computer.

## 2018-10-01 NOTE — Progress Notes (Signed)
Pt is being discharge home. Discharge papers given and explained to pt. Pt verbalized understanding.  Meds and f/u appointments reviewed.  Rx to be picked up from pharmacy.  Pt made aware. 

## 2018-10-03 LAB — HIV ANTIBODY (ROUTINE TESTING W REFLEX): HIV Screen 4th Generation wRfx: NONREACTIVE

## 2018-10-04 LAB — CULTURE, BLOOD (ROUTINE X 2)
Culture: NO GROWTH
Culture: NO GROWTH
Special Requests: ADEQUATE

## 2018-10-04 NOTE — Discharge Summary (Signed)
Leslie Sims, is a 67 y.o. male  DOB 02-16-1951  MRN [id number].  Admission date:  09/29/2018  Admitting Physician  [name], MD  Discharge Date:  10/01/2018   Primary MD  [name], MD  Recommendations for primary care physician for things to follow:   Follow with PCP in 1 week   Admission Diagnosis  COPD exacerbation ([place]) [J44.1] Acute on chronic respiratory failure with hypoxia and hypercapnia (HCC) [J96.21, J96.22]   Discharge Diagnosis  COPD exacerbation ([place]) [J44.1] Acute on chronic respiratory failure with hypoxia and hypercapnia (HCC) [J96.21, J96.22]    Active Problems:   Acute respiratory failure with hypoxia (HCC)      Past Medical History:  Diagnosis Date  . Allergic rhinitis   . Anxiety   . Back pain   . Colon polyps 08/2012   colonoscopy; multiple colon polyps; repeat colonoscopy in one year.  [name] COPD (chronic obstructive pulmonary disease) ([place])   . Coronary artery disease 11/2009   a. late presenting ant MI; b. LHC 100% pLAD s/p PCI/DES, 99% mRCA s/p PCI/DES, EF 35%; c. nuclear stress test 05/13: prior ant/inf infarcts w/o ischemia, EF 43%; d. LHC 02/14: widely patent stents with no other obs dz, EF 40%; e. LHC 11/17: LM nl, mLAD 10%, patent LAD stent, dLAD 20%, p-mRCA 10%, mRCA 40%, patent RCA stent, EF 35-45%   . Depression   . GERD (gastroesophageal reflux disease)   . Headache(784.0)   . Helicobacter pylori (H. pylori)   . Hemorrhoids   . Hernia    inguinal  . Hyperlipidemia   . Hypertension   . Insomnia   . Ischemic cardiomyopathy   . Myalgia   . Peptic ulcer   . Pulmonary nodules   . ST elevation (STEMI) myocardial infarction involving left anterior descending coronary artery ([place]) 11/2009   a. s/p PCI to the LAD  . Status post dilation of esophageal narrowing 2000   . Tinea pedis   . Wears dentures    partial upper    Past Surgical History:  Procedure Laterality Date  . Admission  12/20/2012   COPD exacerbation.  [place].  [name] CARDIAC CATHETERIZATION    . CARDIAC CATHETERIZATION  02/05/13   [facility]  . CARDIAC CATHETERIZATION  10/14   [facility] : patent stents with no change in anatomy. EF: 40$  . CARDIAC CATHETERIZATION Left 11/12/2016   Procedure: Left Heart Cath and Coronary Angiography;  Surgeon: Wellington Hampshire, MD;  Location: [facility];  Service: Cardiovascular;  Laterality: Left;  . COLONOSCOPY    . COLONOSCOPY WITH PROPOFOL N/A 05/18/2016   Procedure: COLONOSCOPY WITH PROPOFOL;  Surgeon: Lucilla Lame, MD;  Location: [facility];  Service: Endoscopy;  Laterality: N/A;  . CORONARY ANGIOPLASTY WITH STENT PLACEMENT  09/2009   LAD 3.0 X23 mm Xience DES, RCA: 4.0 X 15 mm Xience DES  . ELECTROMAGNETIC NAVIGATION BROCHOSCOPY N/A 10/27/2017   Procedure: ELECTROMAGNETIC NAVIGATION BRONCHOSCOPY;  Surgeon: Flora Lipps, MD;  Location: [facility];  Service: Cardiopulmonary;  Laterality: N/A;  . ESOPHAGEAL DILATION    . ESOPHAGOGASTRODUODENOSCOPY  2008  . ESOPHAGOGASTRODUODENOSCOPY  08/20/2012  . HERNIA REPAIR  07/20/2012   L inguinal hernia repair  . PENILE PROSTHESIS IMPLANT         History of present illness and  Hospital Course:     Kindly see H&P for history of present illness and admission details, please review complete Labs, Consult reports and Test reports for all details in brief  HPI  from the history and physical done on the day of admission 67 year old male patient with history of chronic respiratory failure on 2 L of oxygen came in because of shortness of breath, wheezing, required BiPAP, admitted to stepdown d for respiratory failure due to COPD exacerbation.  Hospital Course   On chronic respiratory failure due to hypoxia and hypercapnia due to COPD exacerbation: Patient is off the BiPAP, feeling much better, initially required BiPAP  so admitted to stepdown, after breathing improved he came off the BiPAP, removed BiPAP and transferred to regular floor, discharged home in stable condition, he is chronically on oxygen 2 L.  Discharge home with bronchodilators, tapering course of prednisone.  Advised the patient to quit smoking. #2 chronic systolic heart failure: Euvolemic.  Continue Lasix, losartan at discharge.,  EF 40 to 45% by previous echo in July 2019. 3.  History of left upper lobe nodule, patient needs closer surveillance. 4.  History of CAD, continue home medicines, on aspirin, statins.   Discharge Condition: Stable   Follow UP  Follow-up Information    [name], MD. Schedule an appointment as soon as possible for a visit in 1 week(s).   Specialty:  Family Medicine Contact information: [place] [address] [phone]             Discharge Instructions  and  Discharge Medications   Stable   Allergies as of 10/01/2018      Reactions   Prozac [fluoxetine Hcl] Shortness Of Breath   Effexor Xr [venlafaxine Hcl Er] Other (See Comments)   "Makes me feel funny"   Wellbutrin [bupropion] Other (See Comments)   "Makes me feel funny"      Medication List    STOP taking these medications   predniSONE 20 MG tablet Commonly known as:  DELTASONE Replaced by:  predniSONE 10 MG (21) Tbpk tablet     TAKE these medications   acetaminophen 325 MG tablet Commonly known as:  TYLENOL Take 2 tablets (650 mg total) by mouth every 6 (six) hours as needed for mild pain (or Fever >/= 101).   albuterol 108 (90 Base) MCG/ACT inhaler Commonly known as:  PROVENTIL HFA;VENTOLIN HFA USE 2 INHALATIONS EVERY 6 HOURS AS NEEDED FOR WHEEZING   ARIPiprazole 5 MG tablet Commonly known as:  ABILIFY Take 5 mg by mouth daily.   aspirin 81 MG tablet Take 81 mg by mouth daily.   atorvastatin 40 MG tablet Commonly known as:  LIPITOR Take 1 tablet (40 mg total) by mouth daily.    ciprofloxacin 500 MG tablet Commonly known as:  CIPRO Take 1 tablet (500 mg total) by mouth 2 (two) times daily.   feeding supplement (ENSURE ENLIVE) Liqd Take 237 mLs by mouth 3 (three) times daily between meals.   Fluticasone-Salmeterol 500-50 MCG/DOSE Aepb Commonly known as:  ADVAIR Inhale 1 puff into the lungs 2 (two) times daily.   furosemide 20 MG tablet Commonly known as:  LASIX Take 1 tablet (20 mg total) by mouth daily.   hydrOXYzine 25 MG capsule Commonly known as:  VISTARIL Take 25 mg by mouth 2 (two) times daily as needed.   ipratropium-albuterol 0.5-2.5 (3) MG/3ML Soln Commonly known as:  DUONEB Take 3 mLs by nebulization every 4 (four) hours as needed.   LORazepam 1 MG tablet Commonly known as:  ATIVAN Take 1 tablet (1 mg total) by mouth every 8 (eight) hours as needed for anxiety.   losartan 25 MG tablet  Commonly known as:  COZAAR TAKE 1 TABLET DAILY   mirtazapine 30 MG tablet Commonly known as:  REMERON Take 1 tablet (30 mg total) by mouth at bedtime.   multivitamin capsule Take 1 capsule by mouth daily.   nitroGLYCERIN 0.4 MG SL tablet Commonly known as:  NITROSTAT Place 1 tablet (0.4 mg total) under the tongue every 5 (five) minutes as needed.   predniSONE 10 MG (21) Tbpk tablet Commonly known as:  STERAPRED UNI-PAK 21 TAB Taper by 10 mg daily Replaces:  predniSONE 20 MG tablet   sertraline 100 MG tablet Commonly known as:  ZOLOFT Take 100 mg by mouth daily.   SPIRIVA HANDIHALER 18 MCG inhalation capsule Generic drug:  tiotropium PLACE 1 CAPSULE INTO INHALER AND INHALE THE CONTENTS DAILY   sucralfate 1 GM/10ML suspension Commonly known as:  CARAFATE Take 10 mLs (1 g total) by mouth 4 (four) times daily -  with meals and at bedtime. What changed:    when to take this  reasons to take this         Diet and Activity recommendation: See Discharge Instructions above   Consults obtained -intensivist   Major procedures and  Radiology Reports - PLEASE review detailed and final reports for all details, in brief -     Dg Chest 2 View  Result Date: 09/29/2018 CLINICAL DATA:  Increasing dyspnea and cough x4 days EXAM: CHEST - 2 VIEW COMPARISON:  09/13/2018 CT and CXR FINDINGS: Emphysematous hyperinflation of the lungs. Normal heart size with minimal aortic atherosclerosis. Stable 1 cm nodule projects over the left upper lobe. No pulmonary consolidation or overt pulmonary edema. No effusion. Mild anterior eventration of the left hemidiaphragm with bowel projecting beneath it as before. No aggressive osseous lesions. Calcified lymph node in the right axilla. IMPRESSION: COPD with minimal aortic atherosclerosis. No active pulmonary disease. Calcified nodule left upper lobe measuring approximate 1 cm. Other ancillary findings as above. Electronically Signed   By: Ashley Royalty M.D.   On: 09/29/2018 18:03   Dg Chest 2 View  Result Date: 09/13/2018 CLINICAL DATA:  Shortness of breath and cough EXAM: CHEST - 2 VIEW COMPARISON:  Chest CT June 01, 2018; chest radiograph August 24, 2018 FINDINGS: There is a 1 cm nodular opacity in the left upper lobe, stable from prior CT and recent chest radiograph. There are scattered areas of scarring in both lungs with underlying emphysematous change, better delineated on prior CT. There is no edema or consolidation. The heart size is normal. Pulmonary vascularity is stable and reflects underlying emphysematous change. No evident adenopathy. There is aortic atherosclerosis. No bone lesions. IMPRESSION: Underlying emphysematous changes. Scattered areas of scarring. 1 cm pulmonary nodular lesion left upper lobe, stable. No edema or consolidation. Stable cardiac silhouette.  There is aortic atherosclerosis. Aortic Atherosclerosis (ICD10-I70.0) and Emphysema (ICD10-J43.9). Electronically Signed   By: Lowella Grip III M.D.   On: 09/13/2018 13:26   Ct Angio Chest Pe W And/or Wo Contrast  Result  Date: 09/13/2018 CLINICAL DATA:  67 year old with 5 day history of shortness of breath and nonproductive cough. Patient was seen at an urgent care on Saturday and was started on antibiotic therapy and corticosteroids without interval improvement in symptoms. EXAM: CT ANGIOGRAPHY CHEST WITH CONTRAST TECHNIQUE: Multidetector CT imaging of the chest was performed using the standard protocol during bolus administration of intravenous contrast. Multiplanar CT image reconstructions and MIPs were obtained to evaluate the vascular anatomy. CONTRAST:  64mL OMNIPAQUE IOHEXOL 350 MG/ML IV. COMPARISON:  CT/CTA chest 06/01/2018, 04/27/2018, 02/01/2018 and earlier. PET-CT 10/13/2017. FINDINGS: Cardiovascular: Contrast opacification of pulmonary arteries is excellent. No filling defects within either main pulmonary artery or their segmental branches in either lung to suggest pulmonary embolism. Normal heart size with likely LEFT ventricular hypertrophy. No significant pericardial effusion. RIGHT coronary artery stent. Moderate LAD coronary atherosclerosis. Moderate atherosclerosis involving the thoracic and proximal abdominal aorta without evidence of aneurysm or dissection. Proximal great vessels widely patent. Celiac artery and SUPERIOR mesenteric artery widely patent at their origins. Mediastinum/Nodes: No pathologically enlarged mediastinal, hilar or axillary lymph nodes. No mediastinal masses. Normal-appearing esophagus. Normal-appearing thyroid gland. Lungs/Pleura: Severe emphysematous changes throughout both lungs as noted previously. In the POSTERIOR LEFT UPPER LOBE there is a 10 x 17 mm nodule (14 mm mean diameter) on image 31 of series 6. Even though this nodule was hypermetabolic on the prior PET-CT, it has not significantly changed in size dating back to October, 2018. In the RIGHT UPPER LOBE there is a 10 x 15 mm nodule (13 mm mean diameter) on image 31 of series 6. This nodule is associated with linear scarring and  has decreased in size since October, 2018. It demonstrated only low level metabolic activity on the prior PET-CT. In the SUPERIOR segment LEFT LOWER LOBE there is a 3 x 7 mm nodule (5 mm mean diameter) on image 47 of series 6. This nodule is unchanged dating back to the October, 2018 CT. A 3 mm nodule in the LEFT lung apex is associated with parenchymal scarring and is also unchanged dating back to the October, 2018 CT. No new or enlarging nodules in either lung. Scattered areas of parenchymal scarring in both lungs. No confluent airspace consolidation. No pleural effusions. No ground-glass lung opacities. Upper Abdomen: Stable 2.9 x 1.3 cm adenoma involving the RIGHT adrenal gland (Hounsfield measurement -15) and stable 3.2 x 2.7 cm adenoma involving the LEFT adrenal gland (Hounsfield measurement 0). Upper abdomen otherwise unremarkable for the early arterial phase of enhancement. 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 No acute findings. Review of the MIP images confirms the above findings. IMPRESSION: 1. No evidence of pulmonary embolism. 2.  No acute cardiopulmonary disease. 3. BILATERAL lung nodules as described above, all of which are either stable or decreased in size since October, 2018. (This despite the fact that the LEFT UPPER LOBE lung nodule was hypermetabolic on prior PET-CT). No new or enlarging lung nodules. 4. Stable BILATERAL adrenal adenomas Aortic Atherosclerosis (ICD10-I70.0) and Emphysema (ICD10-J43.9). Electronically Signed   By: Evangeline Dakin M.D.   On: 09/13/2018 17:48    Micro Results    Recent Results (from the past 240 hour(s))  Blood culture (routine x 2)     Status: None   Collection Time: 09/29/18  6:35 PM  Result Value Ref Range Status   Specimen Description BLOOD L ARM  Final   Special Requests   Final    BOTTLES DRAWN AEROBIC AND ANAEROBIC Blood Culture results may not be optimal due to an excessive volume of blood  received in culture bottles   Culture   Final    NO GROWTH 5 DAYS Performed at [facility], [address], [place], [place] [address]    Report Status 10/04/2018 FINAL  Final  Blood culture (routine x 2)     Status: None   Collection Time: 09/29/18  6:35 PM  Result Value Ref Range Status   Specimen Description BLOOD R ARM  Final  Special Requests   Final    BOTTLES DRAWN AEROBIC AND ANAEROBIC Blood Culture adequate volume   Culture   Final    NO GROWTH 5 DAYS Performed at [facility], [address] [place], [place] [address]    Report Status 10/04/2018 FINAL  Final  MRSA PCR Screening     Status: None   Collection Time: 09/29/18  9:22 PM  Result Value Ref Range Status   MRSA by PCR NEGATIVE NEGATIVE Final    Comment:        The GeneXpert MRSA Assay (FDA approved for NASAL specimens only), is one component of a comprehensive MRSA colonization surveillance program. It is not intended to diagnose MRSA infection nor to guide or monitor treatment for MRSA infections. Performed at [facility], [address] [place], [place] [address]        Today   Subjective:   Leslie Sims today has no headache,no chest abdominal pain,no new weakness tingling or numbness, feels much better wants to go home today.   Objective:   Blood pressure (!) 152/86, pulse 82, temperature 97.6 F (36.4 C), temperature source Axillary, resp. rate 20, height 5\' 10"  (1.778 m), weight 62.8 kg, SpO2 94 %.  No intake or output data in the 24 hours ending 10/04/18 1422  Exam Awake Alert, Oriented x 3, No new F.N deficits, Normal affect [place].AT,PERRAL Supple Neck,No JVD, No cervical lymphadenopathy appriciated.  Symmetrical Chest wall movement, Good air movement bilaterally, CTAB RRR,No Gallops,Rubs or new Murmurs, No Parasternal Heave +ve B.Sounds, Abd Soft, Non tender, No organomegaly appriciated, No rebound -guarding or rigidity. No Cyanosis, Clubbing or  edema, No new Rash or bruise  Data Review   CBC w Diff:  Lab Results  Component Value Date   WBC 10.9 (H) 09/29/2018   HGB 15.0 09/29/2018   HGB 15.1 05/22/2018   HCT 46.7 09/29/2018   HCT 44.3 05/22/2018   PLT 250 09/29/2018   PLT 287 05/22/2018   LYMPHOPCT 8 12/25/2017   LYMPHOPCT 5.1 01/11/2013   MONOPCT 28 12/25/2017   MONOPCT 7.5 01/11/2013   EOSPCT 1 12/25/2017   EOSPCT 0.0 01/11/2013   BASOPCT 0 12/25/2017   BASOPCT 0.1 01/11/2013    CMP:  Lab Results  Component Value Date   NA 140 09/29/2018   NA 141 05/22/2018   NA 140 01/11/2013   K 3.9 09/29/2018   K 4.4 01/11/2013   CL 98 09/29/2018   CL 105 01/11/2013   CO2 36 (H) 09/29/2018   CO2 31 01/11/2013   BUN 9 09/29/2018   BUN 20 05/22/2018   BUN 10 01/11/2013   CREATININE 0.73 09/29/2018   CREATININE 0.92 10/13/2016   PROT 6.7 05/22/2018   PROT 6.7 01/10/2013   ALBUMIN 4.5 05/22/2018   ALBUMIN 3.6 01/10/2013   BILITOT 0.2 05/22/2018   BILITOT 0.3 01/10/2013   ALKPHOS 73 05/22/2018   ALKPHOS 99 01/10/2013   AST 17 05/22/2018   AST 19 01/10/2013   ALT 17 05/22/2018   ALT 22 01/10/2013  .   Total Time in preparing paper work, data evaluation and todays exam - 35 minutes  Epifanio Lesches M.D on 10/01/2018 at 2:22 PM    Note: This dictation was prepared with Dragon dictation along with smaller phrase technology. Any transcriptional errors that result from this process are unintentional.

## 2018-10-10 LAB — BLOOD GAS, VENOUS
Acid-Base Excess: 8.7 mmol/L — ABNORMAL HIGH (ref 0.0–2.0)
Bicarbonate: 38.1 mmol/L — ABNORMAL HIGH (ref 20.0–28.0)
Patient temperature: 37
pCO2, Ven: 74 mmHg (ref 44.0–60.0)
pH, Ven: 7.32 (ref 7.250–7.430)

## 2018-10-18 ENCOUNTER — Other Ambulatory Visit: Payer: Self-pay

## 2018-10-18 ENCOUNTER — Emergency Department: Payer: Medicare Other

## 2018-10-18 ENCOUNTER — Emergency Department
Admission: EM | Admit: 2018-10-18 | Discharge: 2018-10-19 | Disposition: A | Discharge status: Home / Self Care | Payer: Medicare Other | Attending: Emergency Medicine | Admitting: Emergency Medicine

## 2018-10-18 ENCOUNTER — Encounter: Payer: Self-pay | Admitting: Emergency Medicine

## 2018-10-18 DIAGNOSIS — Y999 Unspecified external cause status: Secondary | ICD-10-CM | POA: Insufficient documentation

## 2018-10-18 DIAGNOSIS — F17210 Nicotine dependence, cigarettes, uncomplicated: Secondary | ICD-10-CM | POA: Diagnosis not present

## 2018-10-18 DIAGNOSIS — Z79899 Other long term (current) drug therapy: Secondary | ICD-10-CM | POA: Insufficient documentation

## 2018-10-18 DIAGNOSIS — R918 Other nonspecific abnormal finding of lung field: Secondary | ICD-10-CM | POA: Insufficient documentation

## 2018-10-18 DIAGNOSIS — Y92008 Other place in unspecified non-institutional (private) residence as the place of occurrence of the external cause: Secondary | ICD-10-CM | POA: Diagnosis not present

## 2018-10-18 DIAGNOSIS — R1011 Right upper quadrant pain: Secondary | ICD-10-CM | POA: Insufficient documentation

## 2018-10-18 DIAGNOSIS — D122 Benign neoplasm of ascending colon: Secondary | ICD-10-CM | POA: Insufficient documentation

## 2018-10-18 DIAGNOSIS — X500XXA Overexertion from strenuous movement or load, initial encounter: Secondary | ICD-10-CM | POA: Diagnosis not present

## 2018-10-18 DIAGNOSIS — R0789 Other chest pain: Secondary | ICD-10-CM | POA: Diagnosis present

## 2018-10-18 DIAGNOSIS — D124 Benign neoplasm of descending colon: Secondary | ICD-10-CM | POA: Insufficient documentation

## 2018-10-18 DIAGNOSIS — I110 Hypertensive heart disease with heart failure: Secondary | ICD-10-CM | POA: Insufficient documentation

## 2018-10-18 DIAGNOSIS — I5022 Chronic systolic (congestive) heart failure: Secondary | ICD-10-CM | POA: Diagnosis not present

## 2018-10-18 DIAGNOSIS — J449 Chronic obstructive pulmonary disease, unspecified: Secondary | ICD-10-CM | POA: Diagnosis not present

## 2018-10-18 DIAGNOSIS — Z7982 Long term (current) use of aspirin: Secondary | ICD-10-CM | POA: Insufficient documentation

## 2018-10-18 DIAGNOSIS — Y9389 Activity, other specified: Secondary | ICD-10-CM | POA: Diagnosis not present

## 2018-10-18 DIAGNOSIS — R05 Cough: Secondary | ICD-10-CM | POA: Insufficient documentation

## 2018-10-18 DIAGNOSIS — I2510 Atherosclerotic heart disease of native coronary artery without angina pectoris: Secondary | ICD-10-CM | POA: Insufficient documentation

## 2018-10-18 DIAGNOSIS — R0600 Dyspnea, unspecified: Secondary | ICD-10-CM | POA: Insufficient documentation

## 2018-10-18 DIAGNOSIS — Z955 Presence of coronary angioplasty implant and graft: Secondary | ICD-10-CM | POA: Insufficient documentation

## 2018-10-18 DIAGNOSIS — D125 Benign neoplasm of sigmoid colon: Secondary | ICD-10-CM | POA: Insufficient documentation

## 2018-10-18 LAB — URINALYSIS, COMPLETE (UACMP) WITH MICROSCOPIC
BACTERIA UA: NONE SEEN
Bilirubin Urine: NEGATIVE
Glucose, UA: NEGATIVE mg/dL
HGB URINE DIPSTICK: NEGATIVE
Ketones, ur: NEGATIVE mg/dL
Leukocytes, UA: NEGATIVE
Nitrite: NEGATIVE
Protein, ur: NEGATIVE mg/dL
SQUAMOUS EPITHELIAL / LPF: NONE SEEN (ref 0–5)
Specific Gravity, Urine: 1.009 (ref 1.005–1.030)
pH: 6 (ref 5.0–8.0)

## 2018-10-18 LAB — BASIC METABOLIC PANEL
Anion gap: 7 (ref 5–15)
BUN: 12 mg/dL (ref 8–23)
CHLORIDE: 96 mmol/L — AB (ref 98–111)
CO2: 37 mmol/L — AB (ref 22–32)
CREATININE: 0.78 mg/dL (ref 0.61–1.24)
Calcium: 9.4 mg/dL (ref 8.9–10.3)
GFR calc Af Amer: >60 mL/min (ref >60)
GFR calc non Af Amer: >60 mL/min (ref >60)
Glucose, Bld: 177 mg/dL — ABNORMAL HIGH (ref 70–99)
Potassium: 4 mmol/L (ref 3.5–5.1)
Sodium: 140 mmol/L (ref 135–145)

## 2018-10-18 LAB — CBC
HEMATOCRIT: 46.9 % (ref 39.0–52.0)
Hemoglobin: 14.7 g/dL (ref 13.0–17.0)
MCH: 29.5 pg (ref 26.0–34.0)
MCHC: 31.3 g/dL (ref 30.0–36.0)
MCV: 94 fL (ref 80.0–100.0)
Platelets: 258 10*3/uL (ref 150–400)
RBC: 4.99 MIL/uL (ref 4.22–5.81)
RDW: 12 % (ref 11.5–15.5)
WBC: 8.9 10*3/uL (ref 4.0–10.5)
nRBC: 0 % (ref 0.0–0.2)

## 2018-10-18 LAB — LIPASE, BLOOD: Lipase: 25 U/L (ref 11–51)

## 2018-10-18 LAB — TROPONIN I: Troponin I: <0.03 ng/mL (ref <0.03)

## 2018-10-18 IMAGING — CR DG CHEST 2V
1 series · 2 of 2 positions shown · non-contrast
Comparison: [DATE], chest CT [DATE]

CLINICAL DATA: Chest pain

EXAM:
CHEST - 2 VIEW

[Series 1: dg chest 2 view · 0.14mm/px · 2 of 2 slices shown]
[im 1/2]
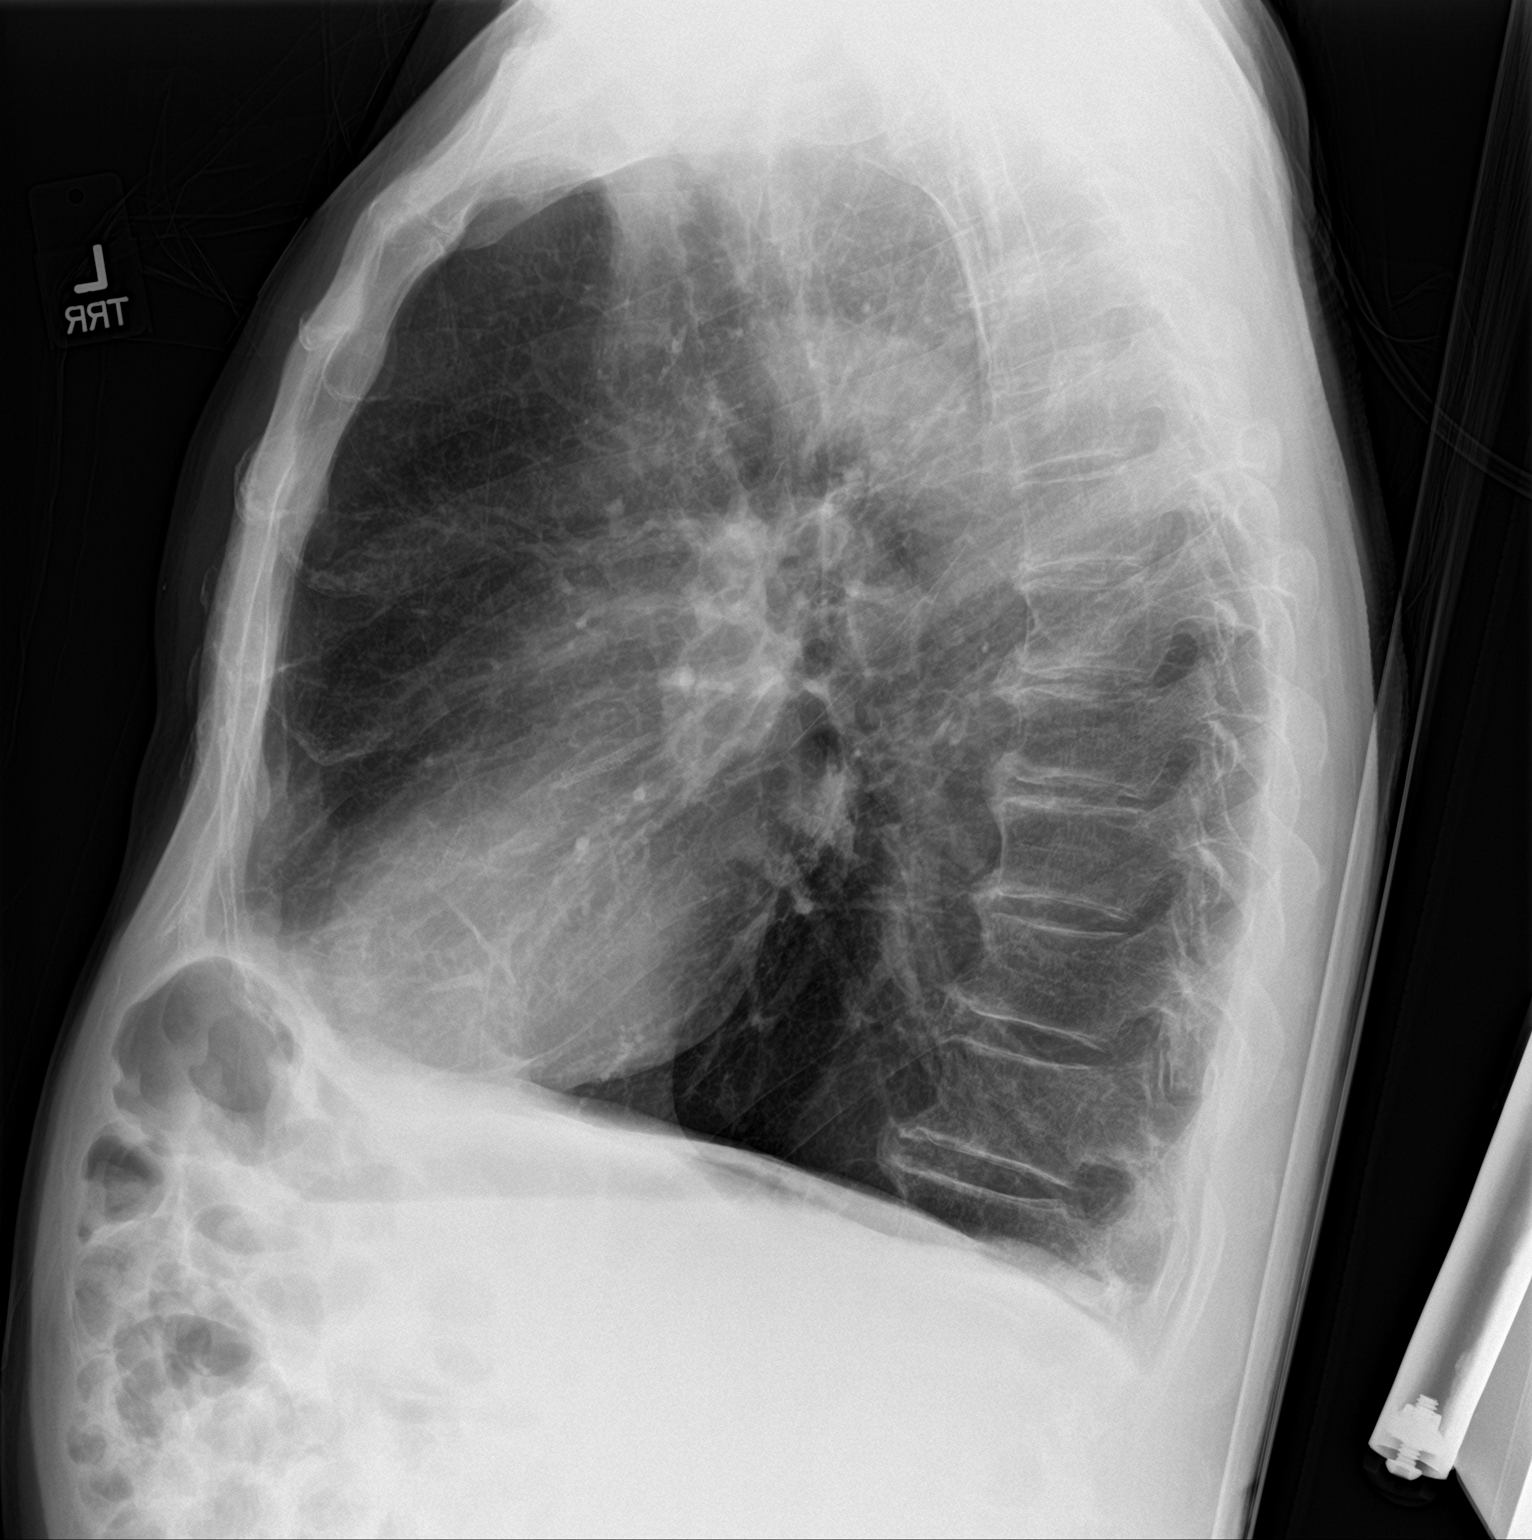
[im 2/2]
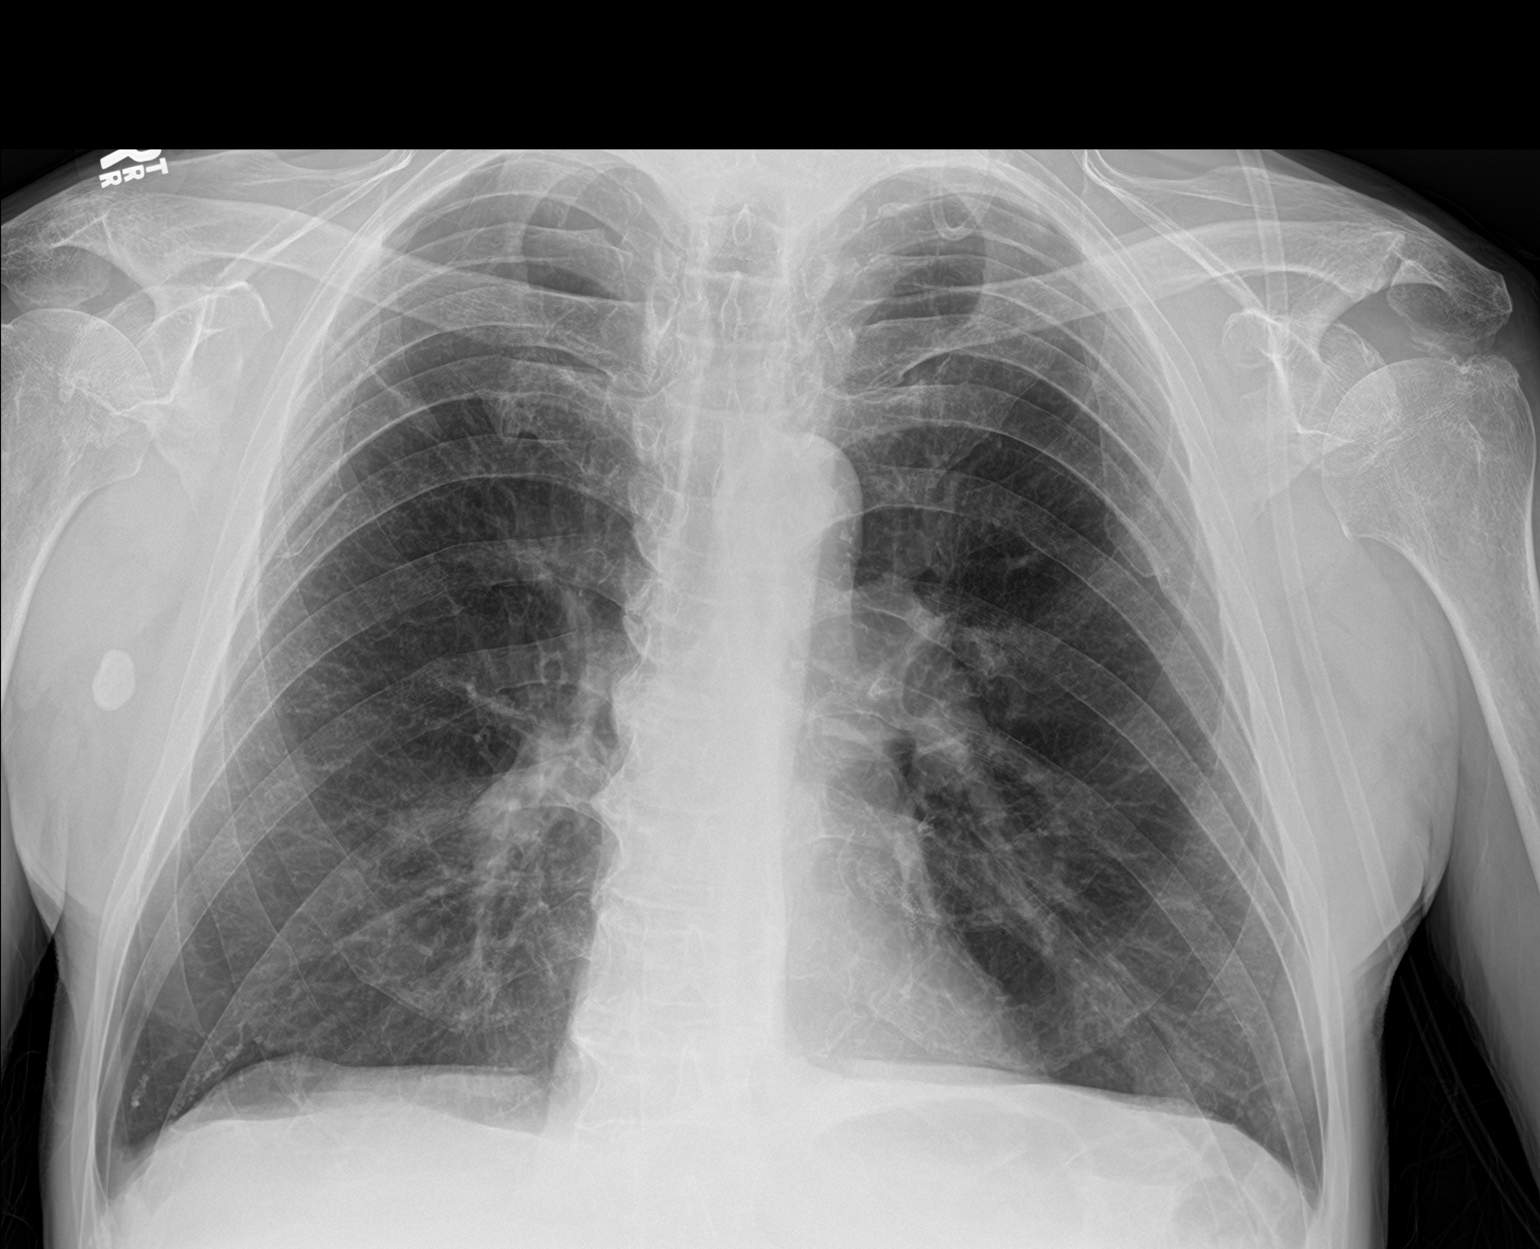

[2 of 2 positions shown; findings below may reference images not displayed]

FINDINGS: Emphysematous hyperinflation of lungs. No acute pulmonary
consolidation or CHF. No effusion or pneumothorax. Mild aortic
atherosclerosis at the arch without aneurysm. Normal size heart.
Axillary lymph node on right, calcified in appearance.
IMPRESSION: 1. COPD.
2. Aortic atherosclerosis.
3. Left upper lobe pulmonary nodule noted previously on CT and
radiographs is not well visualized on current exam.

## 2018-10-18 NOTE — ED Provider Notes (Signed)
[facility] Emergency Department Provider Note   ____________________________________________   I have reviewed the triage vital signs and the nursing notes.   HISTORY  Chief Complaint Right upper quadrant pain History limited by: Not Limited   HPI Leslie Sims is a 67 y.o. male who presents to the emergency department today because of concerns for right upper quadrant pain.  Patient states the pain started 4 days ago.  Located in the right upper quadrant.  He states that it has been somewhat waxing and waning since it started.  He states it does feel slightly better when he goes to bed.  He feels like the pain is moving up into his right chest.  This has been associated with some shortness of breath and difficulty with taking deep breaths.  Has had some nonproductive cough.  Patient does have a history of COPD but states that this does not feel like his normal COPD exacerbations.  He denies any fevers or chills.  Denies any nausea or vomiting.  Per medical record review patient has a history of copd, gerd  Past Medical History:  Diagnosis Date  . Allergic rhinitis   . Anxiety   . Back pain   . Colon polyps 08/2012   colonoscopy; multiple colon polyps; repeat colonoscopy in one year.  [name] COPD (chronic obstructive pulmonary disease) ([place])   . Coronary artery disease 11/2009   a. late presenting ant MI; b. LHC 100% pLAD s/p PCI/DES, 99% mRCA s/p PCI/DES, EF 35%; c. nuclear stress test 05/13: prior ant/inf infarcts w/o ischemia, EF 43%; d. LHC 02/14: widely patent stents with no other obs dz, EF 40%; e. LHC 11/17: LM nl, mLAD 10%, patent LAD stent, dLAD 20%, p-mRCA 10%, mRCA 40%, patent RCA stent, EF 35-45%   . Depression   . GERD (gastroesophageal reflux disease)   . Headache(784.0)   . Helicobacter pylori (H. pylori)   . Hemorrhoids   . Hernia    inguinal  . Hyperlipidemia   . Hypertension   . Insomnia   . Ischemic cardiomyopathy   . Myalgia   .  Peptic ulcer   . Pulmonary nodules   . ST elevation (STEMI) myocardial infarction involving left anterior descending coronary artery ([place]) 11/2009   a. s/p PCI to the LAD  . Status post dilation of esophageal narrowing 2000  . Tinea pedis   . Wears dentures    partial upper    Patient Active Problem List   Diagnosis Date Noted  . Acute respiratory failure with hypoxia ([place]) 09/29/2018  . Acute respiratory failure with hypoxia and hypercapnia ([place]) 09/13/2018  . Acute on chronic respiratory failure with hypoxia and hypercapnia ([place]) 08/24/2018  . Chest pain 07/02/2018  . Grief reaction 01/13/2018  . COPD with hypoxia ([place]) 12/25/2017  . Unstable angina ([place])   . Benign neoplasm of ascending colon   . Benign neoplasm of descending colon   . Benign neoplasm of sigmoid colon   . Personal history of colonic polyps   . Pulmonary nodules/lesions, multiple 01/05/2015  . Bruit of left carotid artery 11/04/2014  . Sleep disorder 07/18/2013  . Seasonal and perennial allergic rhinitis 05/29/2013  . Bradycardia 04/20/2011  . SMOKER 07/30/2010  . CAD, NATIVE VESSEL 04/23/2010  . Hyperlipidemia 03/24/2010  . DEPRESSION/ANXIETY 03/24/2010  . Chronic systolic heart failure ([place]) 03/24/2010  . COPD, very severe ([place]) 03/24/2010  . GERD 03/24/2010  . Essential hypertension 11/21/2009    Past Surgical History:  Procedure Laterality Date  .  Admission  12/20/2012   COPD exacerbation.  [place].  [name] CARDIAC CATHETERIZATION    . CARDIAC CATHETERIZATION  02/05/13   [facility]  . CARDIAC CATHETERIZATION  10/14   [facility] : patent stents with no change in anatomy. EF: 40$  . CARDIAC CATHETERIZATION Left 11/12/2016   Procedure: Left Heart Cath and Coronary Angiography;  Surgeon: Wellington Hampshire, MD;  Location: [place] [facility];  Service: Cardiovascular;  Laterality: Left;  . COLONOSCOPY    . COLONOSCOPY WITH PROPOFOL N/A 05/18/2016   Procedure: COLONOSCOPY WITH PROPOFOL;  Surgeon: Lucilla Lame, MD;   Location: [facility] ENDOSCOPY;  Service: Endoscopy;  Laterality: N/A;  . CORONARY ANGIOPLASTY WITH STENT PLACEMENT  09/2009   LAD 3.0 X23 mm Xience DES, RCA: 4.0 X 15 mm Xience DES  . ELECTROMAGNETIC NAVIGATION BROCHOSCOPY N/A 10/27/2017   Procedure: ELECTROMAGNETIC NAVIGATION BRONCHOSCOPY;  Surgeon: Flora Lipps, MD;  Location: [facility] ORS;  Service: Cardiopulmonary;  Laterality: N/A;  . ESOPHAGEAL DILATION    . ESOPHAGOGASTRODUODENOSCOPY  2008  . ESOPHAGOGASTRODUODENOSCOPY  08/20/2012  . HERNIA REPAIR  07/20/2012   L inguinal hernia repair  . PENILE PROSTHESIS IMPLANT      Prior to Admission medications   Medication Sig Start Date End Date Taking? Authorizing Provider  acetaminophen (TYLENOL) 325 MG tablet Take 2 tablets (650 mg total) by mouth every 6 (six) hours as needed for mild pain (or Fever >/= 101). 08/26/18   Gouru, Aruna, MD  albuterol (VENTOLIN HFA) 108 (90 Base) MCG/ACT inhaler USE 2 INHALATIONS EVERY 6 HOURS AS NEEDED FOR WHEEZING 08/19/17   [name], MD  ARIPiprazole (ABILIFY) 5 MG tablet Take 5 mg by mouth daily. 07/26/18   [provider]  aspirin 81 MG tablet Take 81 mg by mouth daily.      [provider]  atorvastatin (LIPITOR) 40 MG tablet Take 1 tablet (40 mg total) by mouth daily. 03/03/18   [name], MD  ciprofloxacin (CIPRO) 500 MG tablet Take 1 tablet (500 mg total) by mouth 2 (two) times daily. 10/01/18   Epifanio Lesches, MD  feeding supplement, ENSURE ENLIVE, (ENSURE ENLIVE) LIQD Take 237 mLs by mouth 3 (three) times daily between meals. 10/01/18   Epifanio Lesches, MD  Fluticasone-Salmeterol (ADVAIR DISKUS) 500-50 MCG/DOSE AEPB Inhale 1 puff into the lungs 2 (two) times daily. 07/27/17   [name], MD  furosemide (LASIX) 20 MG tablet Take 1 tablet (20 mg total) by mouth daily. 07/07/18 09/30/19  [name], MD  hydrOXYzine (VISTARIL) 25 MG capsule Take 25 mg by mouth 2 (two) times daily as needed. 06/05/18   [provider]   ipratropium-albuterol (DUONEB) 0.5-2.5 (3) MG/3ML SOLN Take 3 mLs by nebulization every 4 (four) hours as needed. 06/24/18   [name], MD  LORazepam (ATIVAN) 1 MG tablet Take 1 tablet (1 mg total) by mouth every 8 (eight) hours as needed for anxiety. 07/07/18   [name], MD  losartan (COZAAR) 25 MG tablet TAKE 1 TABLET DAILY 01/27/18   Minna Merritts, MD  mirtazapine (REMERON) 30 MG tablet Take 1 tablet (30 mg total) by mouth at bedtime. 06/24/18   [name], MD  Multiple Vitamin (MULTIVITAMIN) capsule Take 1 capsule by mouth daily.    [provider]  nitroGLYCERIN (NITROSTAT) 0.4 MG SL tablet Place 1 tablet (0.4 mg total) under the tongue every 5 (five) minutes as needed. 10/17/17   Wellington Hampshire, MD  predniSONE (STERAPRED UNI-PAK 21 TAB) 10 MG (  21) TBPK tablet Taper by 10 mg daily 10/01/18   Epifanio Lesches, MD  sertraline (ZOLOFT) 100 MG tablet Take 100 mg by mouth daily.  06/28/18   [provider]  SPIRIVA HANDIHALER 18 MCG inhalation capsule PLACE 1 CAPSULE INTO INHALER AND INHALE THE CONTENTS DAILY 07/26/18   Trinna Post, PA-C  sucralfate (CARAFATE) 1 GM/10ML suspension Take 10 mLs (1 g total) by mouth 4 (four) times daily -  with meals and at bedtime. Patient taking differently: Take 1 g by mouth 4 (four) times daily as needed.  05/16/18   [name], MD    Allergies Prozac [fluoxetine hcl]; Effexor xr [venlafaxine hcl er]; and Wellbutrin [bupropion]  Family History  Problem Relation Age of Onset  . Heart attack Brother        Brother #1  . Diabetes Brother   . Hypertension Brother        #3  . Coronary artery disease Father 51       deceased  . Heart attack Father   . Diabetes Father   . Heart disease Father   . COPD Mother 22       deceased  . Alcohol abuse Sister        polysubstance abuse  . COPD Sister   . Lung cancer Sister   . Alcohol abuse Sister        polysubstance abuse  . Penile cancer Brother   . Diabetes  Brother     Social History Social History   Tobacco Use  . Smoking status: Current Every Day Smoker    Packs/day: 0.50    Years: 42.00    Pack years: 21.00    Types: Cigarettes  . Smokeless tobacco: Never Used  Substance Use Topics  . Alcohol use: No    Alcohol/week: 0.0 standard drinks  . Drug use: No    Review of Systems Constitutional: No fever/chills Eyes: No visual changes. ENT: No sore throat. Cardiovascular: Denies chest pain. Respiratory: Positive for shortness of breath and cough. Gastrointestinal: Positive for right upper quadrant pain.  Genitourinary: Negative for dysuria. Musculoskeletal: Negative for back pain. Skin: Negative for rash. Neurological: Negative for headaches, focal weakness or numbness.  ____________________________________________   PHYSICAL EXAM:  VITAL SIGNS: ED Triage Vitals  Enc Vitals Group     BP 10/18/18 1943 115/74     Pulse Rate 10/18/18 1943 97     Resp 10/18/18 1943 20     Temp 10/18/18 1943 97.6 F (36.4 C)     Temp Source 10/18/18 1943 Oral     SpO2 10/18/18 1943 97 %     Weight 10/18/18 1941 144 lb (65.3 kg)     Height 10/18/18 1941 5\' 10"  (1.778 m)     Head Circumference --      Peak Flow --      Pain Score 10/18/18 1941 9   Constitutional: Alert and oriented.  Eyes: Conjunctivae are normal.  ENT      Head: Normocephalic and atraumatic.      Nose: No congestion/rhinnorhea.      Mouth/Throat: Mucous membranes are moist.      Neck: No stridor. Hematological/Lymphatic/Immunilogical: No cervical lymphadenopathy. Cardiovascular: Normal rate, regular rhythm.  No murmurs, rubs, or gallops.  Respiratory: Normal respiratory effort without tachypnea nor retractions. Breath sounds are clear and equal bilaterally. No wheezes/rales/rhonchi. Gastrointestinal: Soft and tender to palpation in the right upper quadrant and epigastric region. Genitourinary: Deferred Musculoskeletal: Normal range of motion in all extremities.  No  lower extremity edema. Neurologic:  Normal speech and language. No gross focal neurologic deficits are appreciated.  Skin:  Skin is warm, dry and intact. No rash noted. Psychiatric: Mood and affect are normal. Speech and behavior are normal. Patient exhibits appropriate insight and judgment.  ____________________________________________    LABS (pertinent positives/negatives)  Lipase 25 CBC wbc wnl Trop <0.03 UA clear, 0-5 rbc and wbc BMP na 140, k 4.0, glu 177, cr 0.78  ____________________________________________   EKG  I, Nance Pear, attending physician, personally viewed and interpreted this EKG  EKG Time: 1937 Rate: 96 Rhythm: normal sinus rhythm Axis: left axis deviation Intervals: qtc 452 QRS: incomplete rbbb, LAFB ST changes: no st elevation Impression: abnormal ekg  No significant change when compared to EKG dated 9.28.2019 ____________________________________________    RADIOLOGY  CXR COPD  ____________________________________________   PROCEDURES  Procedures  ____________________________________________   INITIAL IMPRESSION / ASSESSMENT AND PLAN / ED COURSE  Pertinent labs & imaging results that were available during my care of the patient were reviewed by me and considered in my medical decision making (see chart for details).   Patient presented to the emergency department today with concerns for right upper quadrant/right lower chest pain.  On exam he is tender in the right upper quadrant.  She does have a history of COPD but states this does not feel like a normal COPD exacerbation.  Chest x-ray without any obvious concerning findings.  Given location of the patient's pain and tenderness do have some concerns for possible gallbladder disease.  Will start with ultrasound if negative will get CTA to evaluate for pe.   ____________________________________________   FINAL CLINICAL IMPRESSION(S) / ED DIAGNOSES  Right upper quadrant  pain  Note: This dictation was prepared with Dragon dictation. Any transcriptional errors that result from this process are unintentional     Nance Pear, MD 10/19/18 1723

## 2018-10-18 NOTE — ED Provider Notes (Signed)
-----------------------------------------   11:50 PM on 10/18/2018 -----------------------------------------   Assuming care from Dr. Archie Balboa.  In short, Leslie Sims is a 67 y.o. male with a chief complaint of RUQ pain / chest pain.  Refer to the original H&P for additional details.  The current plan of care is to follow up on RUQ U/S.  Dr. Archie Balboa suggests CTA chest if U/S is unremarkable to rule out PE.      ----------------------------------------- 2:43 AM on 10/19/2018 -----------------------------------------  Hepatic function panel is normal as is lipase.  Right upper quadrant ultrasound showed some questionable common bile duct dilatation but no sign of active gallbladder disease.  Given the patient has normal LFTs and no lipase elevation and no elevated T bili I think that it is unlikely he has choledocholithiasis.  I obtain a CTA of the chest as I discussed with Dr. Archie Balboa.  There is no evidence of pulmonary embolism.  The patient has multiple pulmonary nodules but most of them have improved from his scan a year ago although he does have one new one in the right upper lobe.  I informed him of these results and he says he already has a follow-up appointment scheduled with his doctor.  He still has some tenderness to the right upper quadrant as well as tenderness to palpation of the right inferior chest wall.  He says that he may have pulled something when he was feeding his horses and throwing a bucket the other day because he did not have pain before that happened and then gradually got pain afterwards.  I explained I do not have a good explanation for his pain at this time but musculoskeletal chest wall and upper abdominal tenderness is very possible.  He is comfortable with the plan for going home but I gave him strict return precautions that he should come back if he develops any new or worsening symptoms.  He agrees with the current plan.   Hinda Kehr, MD 10/19/18  [phone]

## 2018-10-18 NOTE — ED Triage Notes (Signed)
Constant chest pain to right chest for 4 days. + cough, no fever.  Wears 2 L [place] at baseline.  Has had increased SHOB.  Unlabored in triage.  Pain starts RUQ and radiates into chest.  Denies shoulder/back pain.  Denies NVD

## 2018-10-19 ENCOUNTER — Emergency Department: Payer: Medicare Other

## 2018-10-19 LAB — HEPATIC FUNCTION PANEL
ALBUMIN: 3.9 g/dL (ref 3.5–5.0)
ALT: 18 U/L (ref 0–44)
AST: 16 U/L (ref 15–41)
Alkaline Phosphatase: 68 U/L (ref 38–126)
Bilirubin, Direct: <0.1 mg/dL (ref 0.0–0.2)
TOTAL PROTEIN: 7.1 g/dL (ref 6.5–8.1)
Total Bilirubin: 0.5 mg/dL (ref 0.3–1.2)

## 2018-10-19 IMAGING — CT CT ANGIO CHEST
2 of 6 series · 18 of 46 positions shown · IV contrast (APPLIED)
Comparison: [DATE], [DATE] and [DATE]

CLINICAL DATA: Constant chest pain right side 4 days with cough.
Oxygen dependent. Shortness of breath.

EXAM:
CT ANGIOGRAPHY CHEST WITH CONTRAST
TECHNIQUE: Multidetector CT imaging of the chest was performed using the
standard protocol during bolus administration of intravenous
contrast. Multiplanar CT image reconstructions and MIPs were
obtained to evaluate the vascular anatomy.
CONTRAST:  75mL [Q0] IOPAMIDOL ([Q0]) INJECTION 76%

[Series 5: thins · axial · 0.80mm/px · z∈[+922,+1237]mm · 16 of 346 slices shown]
[im 16/346  lung]
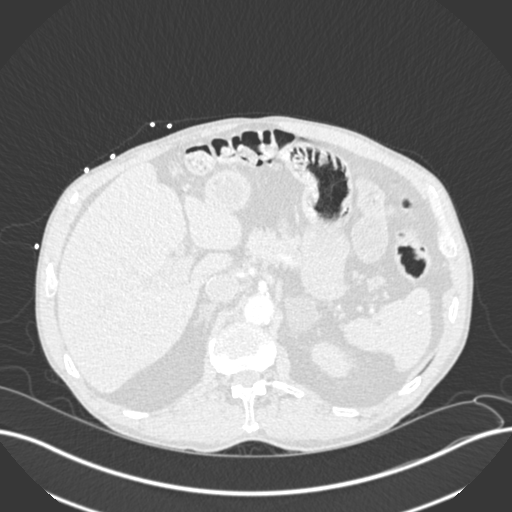
[im 46/346  soft-tissue]
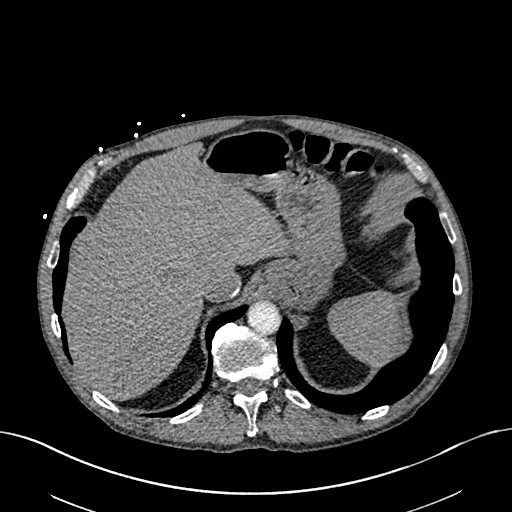
[im 61/346  lung]
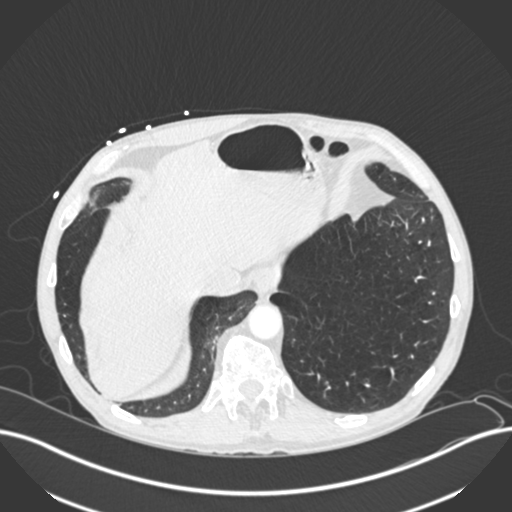
[im 76/346  soft-tissue]
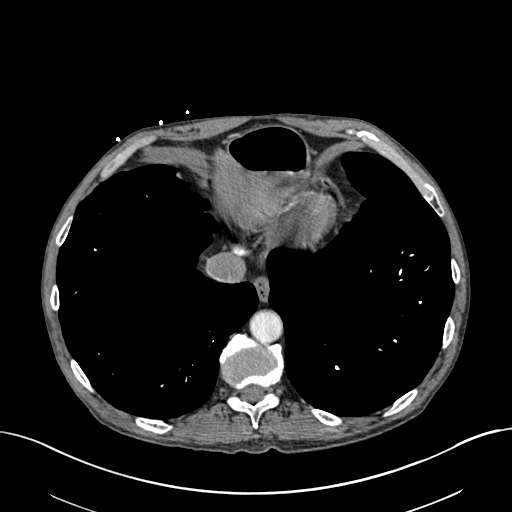
[im 106/346  lung]
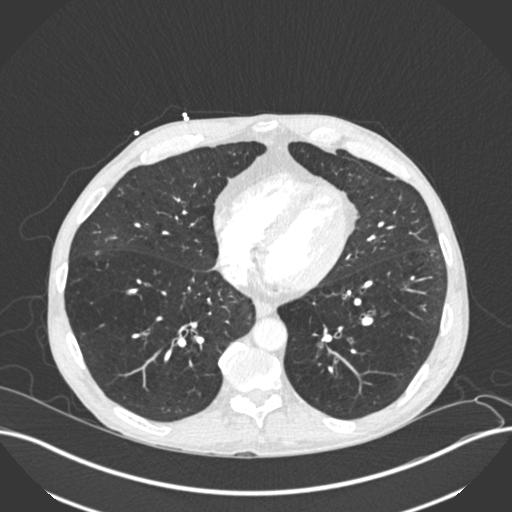
[im 121/346  soft-tissue]
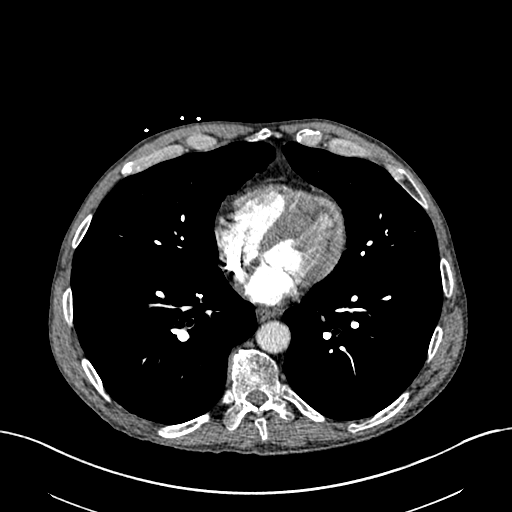
[im 136/346  lung]
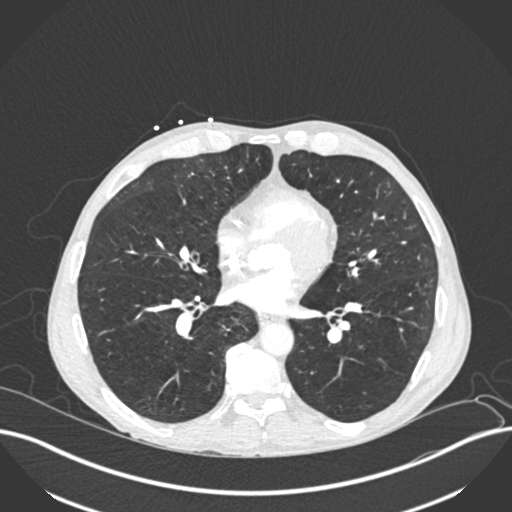
[im 166/346  soft-tissue]
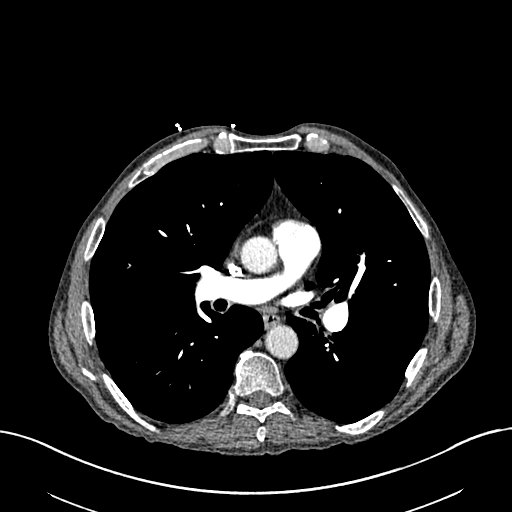
[im 181/346  lung]
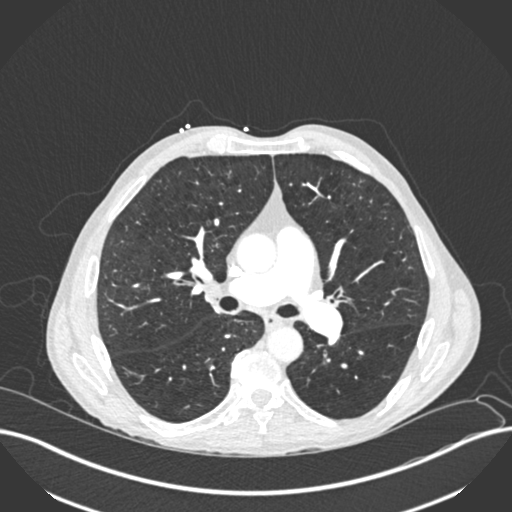
[im 211/346  soft-tissue]
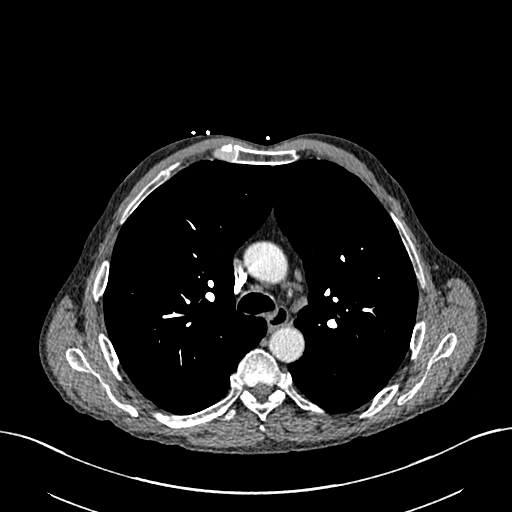
[im 226/346  lung]
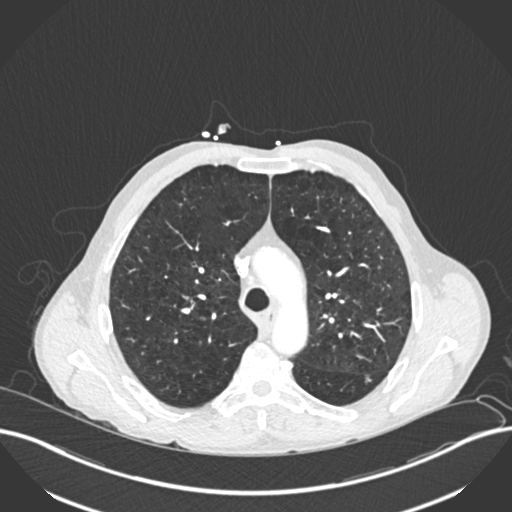
[im 241/346  soft-tissue]
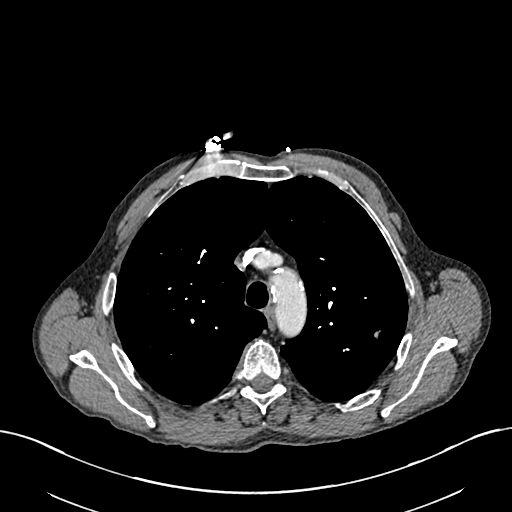
[im 271/346  lung]
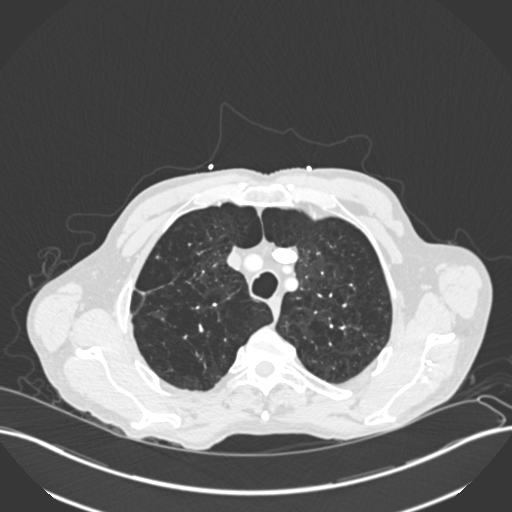
[im 286/346  soft-tissue]
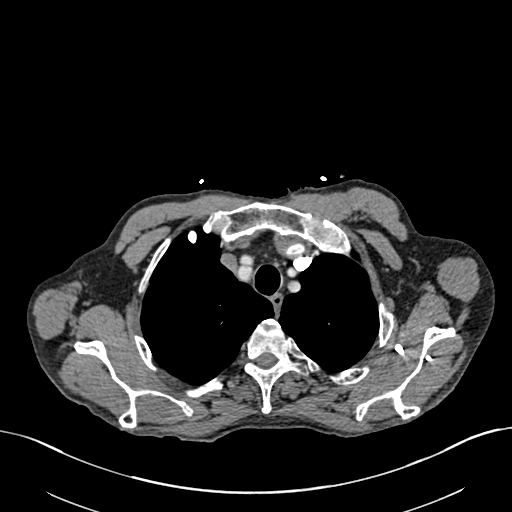
[im 301/346  lung]
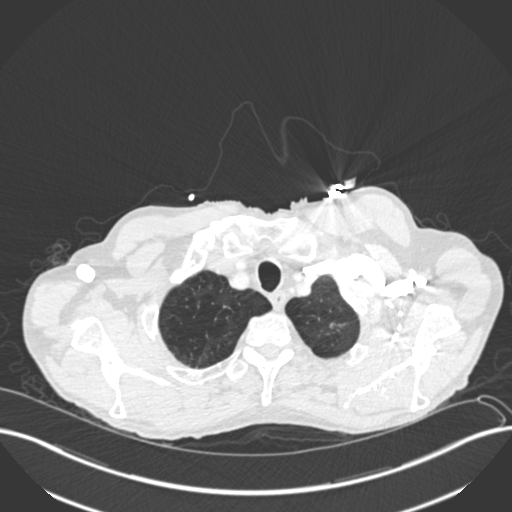
[im 331/346  soft-tissue]
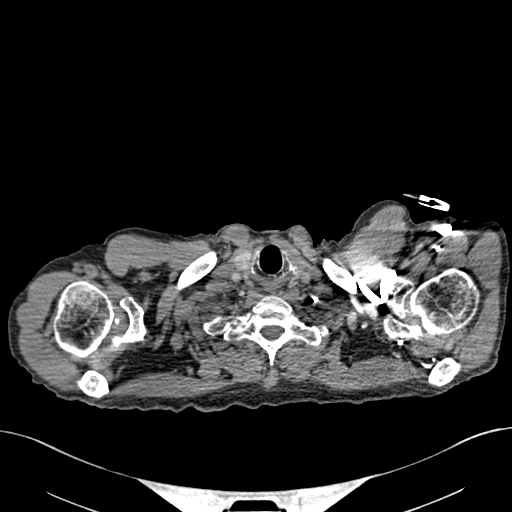

[Series 7: coronal mpr · coronal · 0.68mm/px · 2 of 88 slices shown]
[im 30/88  soft-tissue]
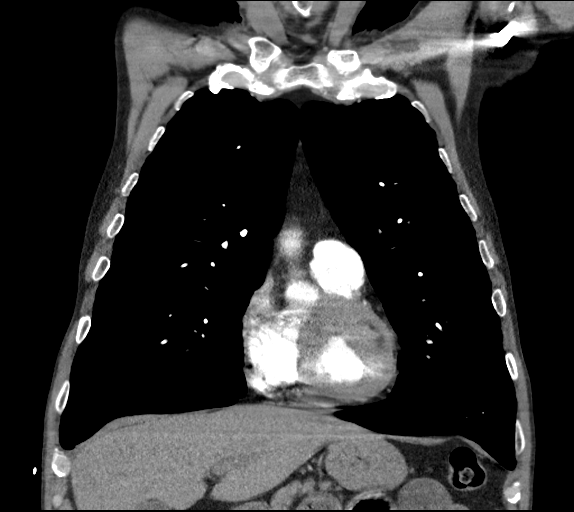
[im 59/88  soft-tissue]
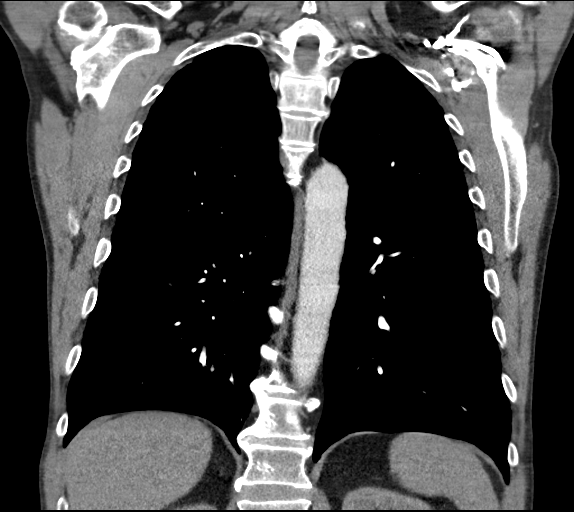

[18 of 46 positions shown; findings below may reference images not displayed]

FINDINGS: Cardiovascular: Heart is normal size. There is calcified plaque over
the left anterior descending and right coronary arteries. Minimal
calcified plaque over the thoracic aorta. Pulmonary arterial system
is well opacified without emboli.

Mediastinum/Nodes: No mediastinal or hilar adenopathy.

Lungs/Pleura: Lungs are adequately inflated with moderate
centrilobular emphysematous disease. No focal airspace consolidation
or effusion. Stable 4 mm nodule over the left apex. Stable 8 x 10 mm
nodule over the left upper lobe which is actually decreased in size
compared to [DATE]. Stable left lower lobe nodule measuring 5
mm. Stable 8 x 12 mm nodule over the right upper lobe actually
decreased in size compared to [DATE]. New 7 mm nodule over the
right upper lobe. Remainder of lungs are unchanged. Airways are
normal.

Upper Abdomen: No acute findings. Mild calcified plaque over the
abdominal aorta. 2.6 cm left adrenal adenoma unchanged.

Musculoskeletal: Minimal degenerative change of the spine.

Review of the MIP images confirms the above findings.
IMPRESSION: No acute cardiopulmonary disease and no evidence of pulmonary
embolism.

Bilateral pulmonary nodules as described which are stable or
decreased in size compared to [DATE]. One new 7 mm nodule over
the right upper lobe. Recommend follow-up CT 6 months. This
recommendation follows the consensus statement: Guidelines for
Management of Small Pulmonary Nodules Detected on CT Scans: A
Statement from the [HOSPITAL] as published in Radiology
[Q0]; [DATE]. Online at:
[URL]

Aortic Atherosclerosis ([Q0]-[Q0]) and Emphysema ([Q0]-[Q0]).

Atherosclerotic coronary artery disease.

2.6 cm left adrenal adenoma.

## 2018-10-19 MED ORDER — SODIUM CHLORIDE 0.9 % IV BOLUS
500.0000 mL | Freq: Once | INTRAVENOUS | Status: AC
  Administered 2018-10-19: 500 mL via INTRAVENOUS

## 2018-10-19 MED ORDER — IOPAMIDOL (ISOVUE-370) INJECTION 76%
75.0000 mL | Freq: Once | INTRAVENOUS | Status: AC | PRN
  Administered 2018-10-19: 75 mL via INTRAVENOUS

## 2018-10-19 NOTE — ED Notes (Signed)
Patient discharged to home per MD order. Patient in stable condition, and deemed medically cleared by ED provider for discharge. Discharge instructions reviewed with patient/family using "Teach Back"; verbalized understanding of medication education and administration, and information about follow-up care. Denies further concerns. ° °

## 2018-10-19 NOTE — Discharge Instructions (Addendum)
Your workup in the Emergency Department today was reassuring.  We did not find any specific abnormalities.  We recommend you drink plenty of fluids, take your regular medications and/or any new ones prescribed today, and follow up with the doctor(s) listed in these documents as recommended.  Use over-the-counter pain medication as needed.  Of note, several of the pulmonary nodules seen last year on your chest CT have gotten smaller, but you have a new pulmonary nodule in the right upper lobe visualized on the CTA chest tonight.  Please follow-up with your regular doctor to discuss repeat imaging in at least a year to keep an eye on these nodules.  Return to the Emergency Department if you develop new or worsening symptoms that concern you.

## 2018-10-23 ENCOUNTER — Other Ambulatory Visit: Payer: Self-pay

## 2018-10-23 ENCOUNTER — Emergency Department
Admission: EM | Admit: 2018-10-23 | Discharge: 2018-10-23 | Disposition: A | Discharge status: Home / Self Care | Payer: Medicare Other | Attending: Emergency Medicine | Admitting: Emergency Medicine

## 2018-10-23 ENCOUNTER — Emergency Department: Payer: Medicare Other

## 2018-10-23 DIAGNOSIS — Z7982 Long term (current) use of aspirin: Secondary | ICD-10-CM | POA: Insufficient documentation

## 2018-10-23 DIAGNOSIS — Z79899 Other long term (current) drug therapy: Secondary | ICD-10-CM | POA: Diagnosis not present

## 2018-10-23 DIAGNOSIS — R0602 Shortness of breath: Secondary | ICD-10-CM | POA: Diagnosis present

## 2018-10-23 DIAGNOSIS — J441 Chronic obstructive pulmonary disease with (acute) exacerbation: Secondary | ICD-10-CM | POA: Insufficient documentation

## 2018-10-23 DIAGNOSIS — F17210 Nicotine dependence, cigarettes, uncomplicated: Secondary | ICD-10-CM | POA: Diagnosis not present

## 2018-10-23 DIAGNOSIS — I259 Chronic ischemic heart disease, unspecified: Secondary | ICD-10-CM | POA: Diagnosis not present

## 2018-10-23 DIAGNOSIS — I110 Hypertensive heart disease with heart failure: Secondary | ICD-10-CM | POA: Insufficient documentation

## 2018-10-23 DIAGNOSIS — I5022 Chronic systolic (congestive) heart failure: Secondary | ICD-10-CM | POA: Insufficient documentation

## 2018-10-23 LAB — BASIC METABOLIC PANEL
Anion gap: 10 (ref 5–15)
BUN: 9 mg/dL (ref 8–23)
CALCIUM: 9.3 mg/dL (ref 8.9–10.3)
CO2: 35 mmol/L — ABNORMAL HIGH (ref 22–32)
CREATININE: 0.78 mg/dL (ref 0.61–1.24)
Chloride: 97 mmol/L — ABNORMAL LOW (ref 98–111)
GFR calc Af Amer: >60 mL/min (ref >60)
Glucose, Bld: 93 mg/dL (ref 70–99)
Potassium: 4.1 mmol/L (ref 3.5–5.1)
SODIUM: 142 mmol/L (ref 135–145)

## 2018-10-23 LAB — CBC
HEMATOCRIT: 43.6 % (ref 39.0–52.0)
Hemoglobin: 13.7 g/dL (ref 13.0–17.0)
MCH: 29.5 pg (ref 26.0–34.0)
MCHC: 31.4 g/dL (ref 30.0–36.0)
MCV: 94 fL (ref 80.0–100.0)
PLATELETS: 251 10*3/uL (ref 150–400)
RBC: 4.64 MIL/uL (ref 4.22–5.81)
RDW: 12.2 % (ref 11.5–15.5)
WBC: 6 10*3/uL (ref 4.0–10.5)
nRBC: 0 % (ref 0.0–0.2)

## 2018-10-23 LAB — TROPONIN I

## 2018-10-23 IMAGING — DX DG CHEST 1V PORT
2 series · 2 of 2 positions shown · non-contrast
Comparison: PA and lateral chest [DATE] and [DATE].. CT
chest [DATE].

CLINICAL DATA: Shortness of breath and cough for 3-4 days.

EXAM:
PORTABLE CHEST 1 VIEW

[chest ap (1 of 2)]
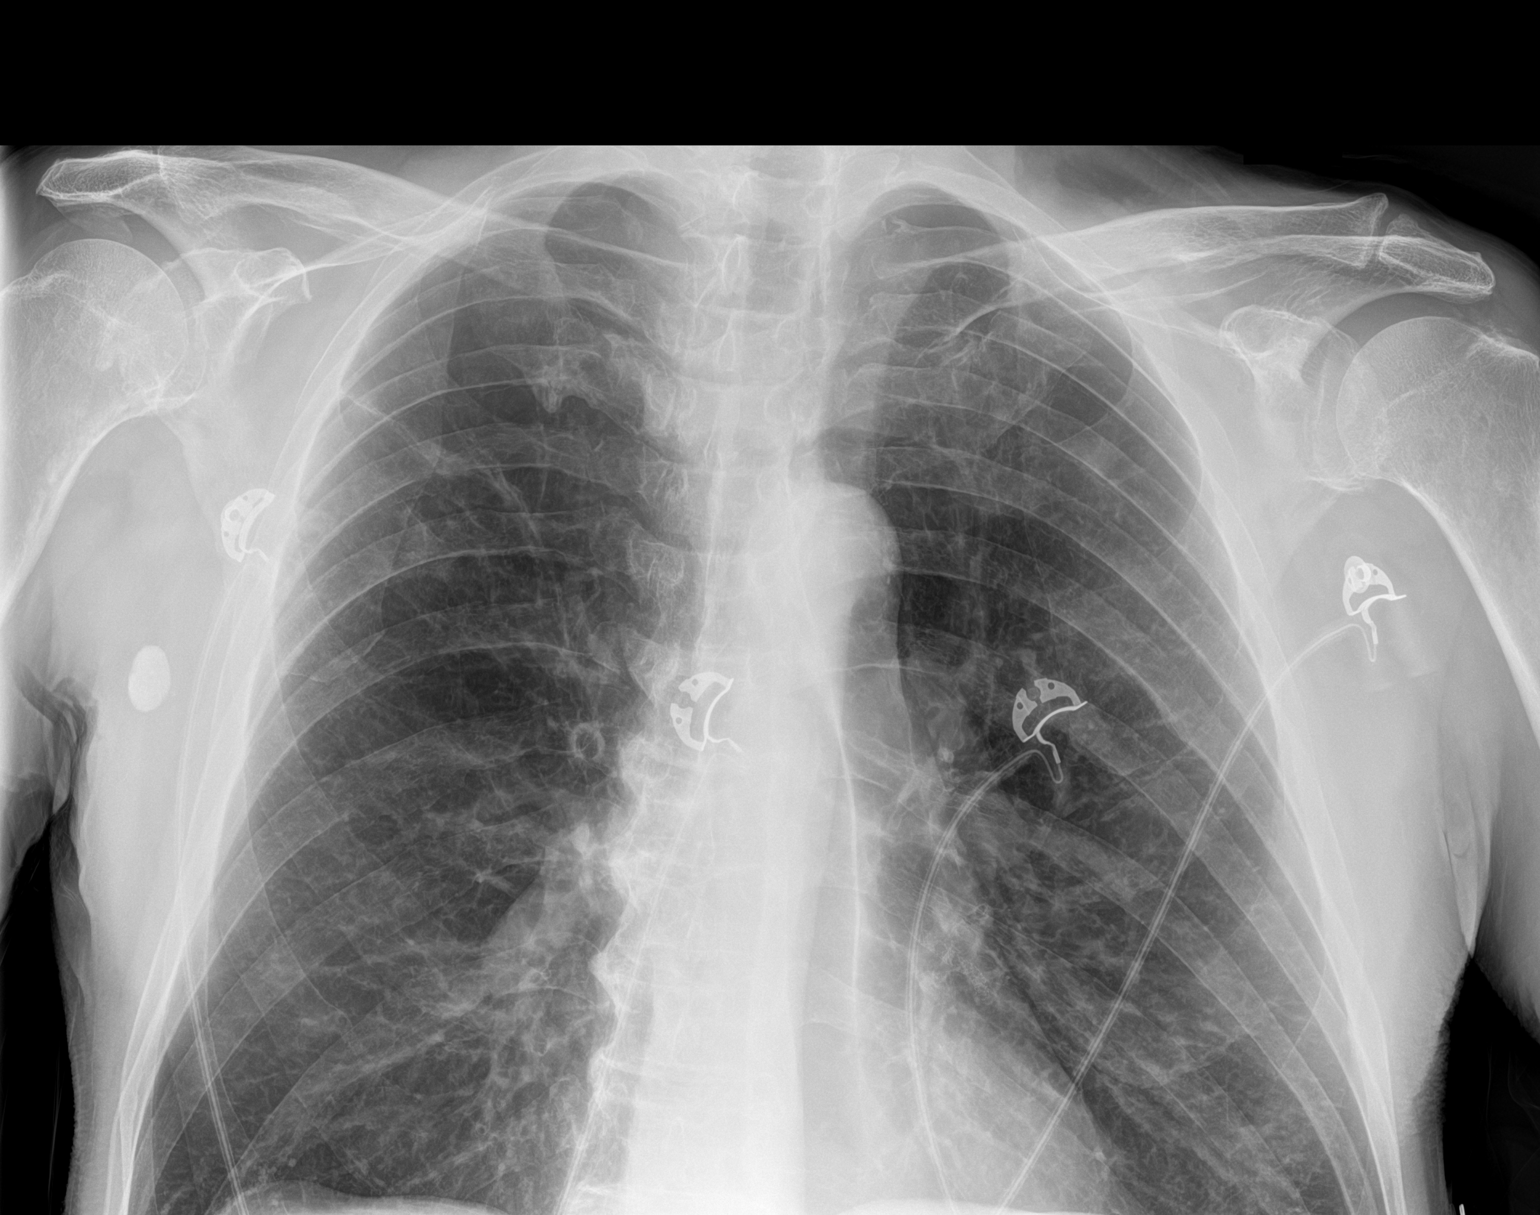

[chest ap (2 of 2)]
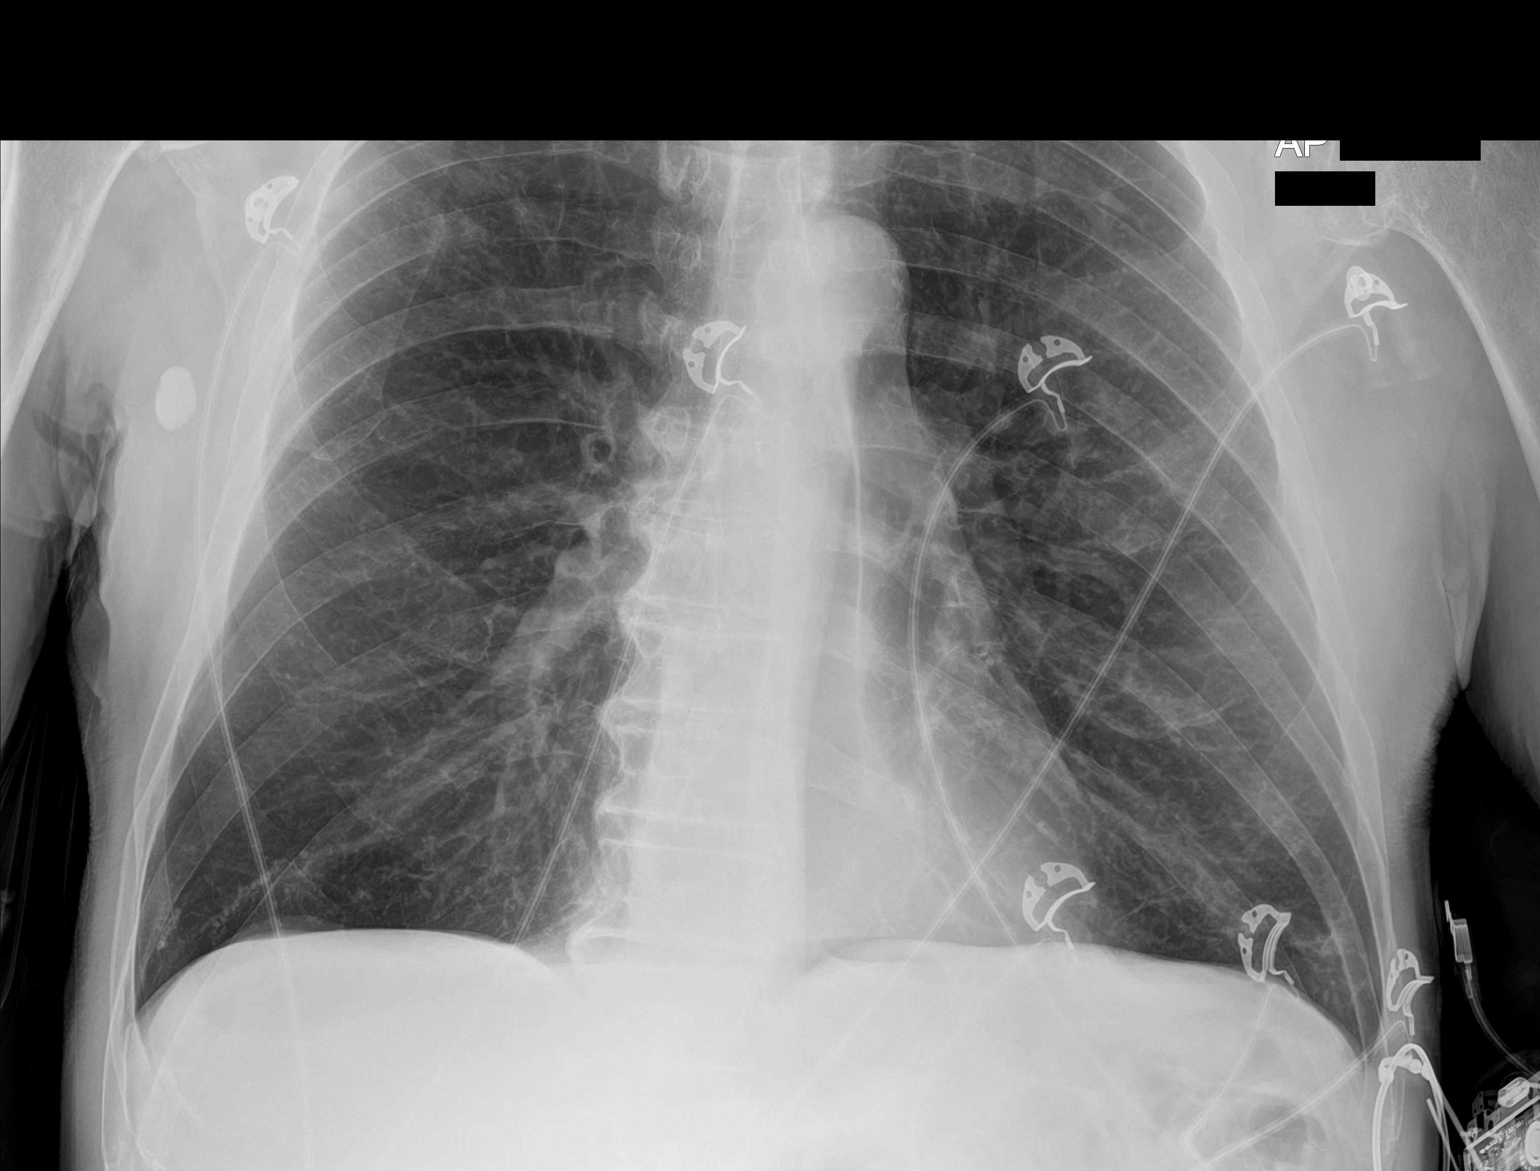

[2 of 2 positions shown; findings below may reference images not displayed]

FINDINGS: The lungs are emphysematous without consolidative process. Right
upper lobe scar and a nodule in the left upper lobe are noted as
seen on the prior exams. Heart size is normal. No pneumothorax or
pleural effusion. No acute or focal bony abnormality.
IMPRESSION: No acute disease.

Emphysema.

## 2018-10-23 MED ORDER — PREDNISONE 20 MG PO TABS: 40.0000 mg | ORAL_TABLET | Freq: Every day | ORAL | 0 refills | Status: DC

## 2018-10-23 MED ORDER — METHYLPREDNISOLONE SODIUM SUCC 125 MG IJ SOLR
125.0000 mg | Freq: Once | INTRAMUSCULAR | Status: AC
  Administered 2018-10-23: 125 mg via INTRAVENOUS
  Filled 2018-10-23: qty 2

## 2018-10-23 MED ORDER — AZITHROMYCIN 250 MG PO TABS: ORAL_TABLET | ORAL | 0 refills | Status: AC

## 2018-10-23 MED ORDER — IPRATROPIUM-ALBUTEROL 0.5-2.5 (3) MG/3ML IN SOLN
3.0000 mL | Freq: Once | RESPIRATORY_TRACT | Status: AC
  Administered 2018-10-23: 3 mL via RESPIRATORY_TRACT
  Filled 2018-10-23: qty 3

## 2018-10-23 NOTE — ED Provider Notes (Addendum)
[facility] Emergency Department Provider Note  Time seen: 1:13 PM  I have reviewed the triage vital signs and the nursing notes.   HISTORY  Chief Complaint Shortness of Breath    HPI Leslie Sims is a 67 y.o. male with a past medical history of anxiety, COPD, CAD, gastric reflux, MI, cardiomyopathy, presents to the emergency department for shortness of breath.  According to the patient for little over 1 week he has been feeling short of breath especially with exertion.  Patient has a history of COPD and wears oxygen as needed and at night.  For the last 2 to 3 days has been wearing oxygen 1 to 3 L throughout the day as well.  Patient states mild cough with occasional minimal sputum production.  Denies any fever.  States minimal right chest pain that has been ongoing over the past 1 week described as a tightness.  Denies any abdominal pain, nausea vomiting or diarrhea.  Largely negative review of systems otherwise.  Patient was seen in the emergency department 10/18/2018 for similar complaints with a negative work-up including CT scan of the chest.   Past Medical History:  Diagnosis Date  . Allergic rhinitis   . Anxiety   . Back pain   . Colon polyps 08/2012   colonoscopy; multiple colon polyps; repeat colonoscopy in one year.  [name] COPD (chronic obstructive pulmonary disease) ([place])   . Coronary artery disease 11/2009   a. late presenting ant MI; b. LHC 100% pLAD s/p PCI/DES, 99% mRCA s/p PCI/DES, EF 35%; c. nuclear stress test 05/13: prior ant/inf infarcts w/o ischemia, EF 43%; d. LHC 02/14: widely patent stents with no other obs dz, EF 40%; e. LHC 11/17: LM nl, mLAD 10%, patent LAD stent, dLAD 20%, p-mRCA 10%, mRCA 40%, patent RCA stent, EF 35-45%   . Depression   . GERD (gastroesophageal reflux disease)   . Headache(784.0)   . Helicobacter pylori (H. pylori)   . Hemorrhoids   . Hernia    inguinal  . Hyperlipidemia   . Hypertension   . Insomnia   .  Ischemic cardiomyopathy   . Myalgia   . Peptic ulcer   . Pulmonary nodules   . ST elevation (STEMI) myocardial infarction involving left anterior descending coronary artery ([place]) 11/2009   a. s/p PCI to the LAD  . Status post dilation of esophageal narrowing 2000  . Tinea pedis   . Wears dentures    partial upper    Patient Active Problem List   Diagnosis Date Noted  . Acute respiratory failure with hypoxia ([place]) 09/29/2018  . Acute respiratory failure with hypoxia and hypercapnia ([place]) 09/13/2018  . Acute on chronic respiratory failure with hypoxia and hypercapnia ([place]) 08/24/2018  . Chest pain 07/02/2018  . Grief reaction 01/13/2018  . COPD with hypoxia ([place]) 12/25/2017  . Unstable angina ([place])   . Benign neoplasm of ascending colon   . Benign neoplasm of descending colon   . Benign neoplasm of sigmoid colon   . Personal history of colonic polyps   . Pulmonary nodules/lesions, multiple 01/05/2015  . Bruit of left carotid artery 11/04/2014  . Sleep disorder 07/18/2013  . Seasonal and perennial allergic rhinitis 05/29/2013  . Bradycardia 04/20/2011  . SMOKER 07/30/2010  . CAD, NATIVE VESSEL 04/23/2010  . Hyperlipidemia 03/24/2010  . DEPRESSION/ANXIETY 03/24/2010  . Chronic systolic heart failure ([place]) 03/24/2010  . COPD, very severe ([place]) 03/24/2010  . GERD 03/24/2010  . Essential hypertension 11/21/2009  Past Surgical History:  Procedure Laterality Date  . Admission  12/20/2012   COPD exacerbation.  [place].  [name] CARDIAC CATHETERIZATION    . CARDIAC CATHETERIZATION  02/05/13   [facility]  . CARDIAC CATHETERIZATION  10/14   [facility] : patent stents with no change in anatomy. EF: 40$  . CARDIAC CATHETERIZATION Left 11/12/2016   Procedure: Left Heart Cath and Coronary Angiography;  Surgeon: Wellington Hampshire, MD;  Location: [facility];  Service: Cardiovascular;  Laterality: Left;  . COLONOSCOPY    . COLONOSCOPY WITH PROPOFOL N/A 05/18/2016   Procedure: COLONOSCOPY WITH  PROPOFOL;  Surgeon: Lucilla Lame, MD;  Location: [facility] ENDOSCOPY;  Service: Endoscopy;  Laterality: N/A;  . CORONARY ANGIOPLASTY WITH STENT PLACEMENT  09/2009   LAD 3.0 X23 mm Xience DES, RCA: 4.0 X 15 mm Xience DES  . ELECTROMAGNETIC NAVIGATION BROCHOSCOPY N/A 10/27/2017   Procedure: ELECTROMAGNETIC NAVIGATION BRONCHOSCOPY;  Surgeon: Flora Lipps, MD;  Location: [facility] ORS;  Service: Cardiopulmonary;  Laterality: N/A;  . ESOPHAGEAL DILATION    . ESOPHAGOGASTRODUODENOSCOPY  2008  . ESOPHAGOGASTRODUODENOSCOPY  08/20/2012  . HERNIA REPAIR  07/20/2012   L inguinal hernia repair  . PENILE PROSTHESIS IMPLANT      Prior to Admission medications   Medication Sig Start Date End Date Taking? Authorizing Provider  acetaminophen (TYLENOL) 325 MG tablet Take 2 tablets (650 mg total) by mouth every 6 (six) hours as needed for mild pain (or Fever >/= 101). 08/26/18   Gouru, Aruna, MD  albuterol (VENTOLIN HFA) 108 (90 Base) MCG/ACT inhaler USE 2 INHALATIONS EVERY 6 HOURS AS NEEDED FOR WHEEZING 08/19/17   [name], MD  ARIPiprazole (ABILIFY) 5 MG tablet Take 5 mg by mouth daily. 07/26/18   [provider]  aspirin 81 MG tablet Take 81 mg by mouth daily.      [provider]  atorvastatin (LIPITOR) 40 MG tablet Take 1 tablet (40 mg total) by mouth daily. 03/03/18   [name], MD  ciprofloxacin (CIPRO) 500 MG tablet Take 1 tablet (500 mg total) by mouth 2 (two) times daily. 10/01/18   Epifanio Lesches, MD  feeding supplement, ENSURE ENLIVE, (ENSURE ENLIVE) LIQD Take 237 mLs by mouth 3 (three) times daily between meals. 10/01/18   Epifanio Lesches, MD  Fluticasone-Salmeterol (ADVAIR DISKUS) 500-50 MCG/DOSE AEPB Inhale 1 puff into the lungs 2 (two) times daily. 07/27/17   [name], MD  furosemide (LASIX) 20 MG tablet Take 1 tablet (20 mg total) by mouth daily. 07/07/18 09/30/19  [name], MD  hydrOXYzine (VISTARIL) 25 MG capsule Take 25 mg by mouth 2 (two) times daily as  needed. 06/05/18   [provider]  ipratropium-albuterol (DUONEB) 0.5-2.5 (3) MG/3ML SOLN Take 3 mLs by nebulization every 4 (four) hours as needed. 06/24/18   [name], MD  LORazepam (ATIVAN) 1 MG tablet Take 1 tablet (1 mg total) by mouth every 8 (eight) hours as needed for anxiety. 07/07/18   [name], MD  losartan (COZAAR) 25 MG tablet TAKE 1 TABLET DAILY 01/27/18   Minna Merritts, MD  mirtazapine (REMERON) 30 MG tablet Take 1 tablet (30 mg total) by mouth at bedtime. 06/24/18   [name], MD  Multiple Vitamin (MULTIVITAMIN) capsule Take 1 capsule by mouth daily.    [provider]  nitroGLYCERIN (NITROSTAT) 0.4 MG SL tablet Place 1 tablet (0.4 mg total) under the tongue every 5 (five) minutes as needed. 10/17/17   Wellington Hampshire,  MD  predniSONE (STERAPRED UNI-PAK 21 TAB) 10 MG (21) TBPK tablet Taper by 10 mg daily 10/01/18   Epifanio Lesches, MD  sertraline (ZOLOFT) 100 MG tablet Take 100 mg by mouth daily.  06/28/18   [provider]  SPIRIVA HANDIHALER 18 MCG inhalation capsule PLACE 1 CAPSULE INTO INHALER AND INHALE THE CONTENTS DAILY 07/26/18   Trinna Post, PA-C  sucralfate (CARAFATE) 1 GM/10ML suspension Take 10 mLs (1 g total) by mouth 4 (four) times daily -  with meals and at bedtime. Patient taking differently: Take 1 g by mouth 4 (four) times daily as needed.  05/16/18   [name], MD    Allergies  Allergen Reactions  . Prozac [Fluoxetine Hcl] Shortness Of Breath  . Effexor Xr [Venlafaxine Hcl Er] Other (See Comments)    "Makes me feel funny"  . Wellbutrin [Bupropion] Other (See Comments)    "Makes me feel funny"    Family History  Problem Relation Age of Onset  . Heart attack Brother        Brother #1  . Diabetes Brother   . Hypertension Brother        #3  . Coronary artery disease Father 77       deceased  . Heart attack Father   . Diabetes Father   . Heart disease Father   . COPD Mother 64       deceased   . Alcohol abuse Sister        polysubstance abuse  . COPD Sister   . Lung cancer Sister   . Alcohol abuse Sister        polysubstance abuse  . Penile cancer Brother   . Diabetes Brother     Social History Social History   Tobacco Use  . Smoking status: Current Every Day Smoker    Packs/day: 1.00    Years: 42.00    Pack years: 42.00    Types: Cigarettes  . Smokeless tobacco: Never Used  Substance Use Topics  . Alcohol use: No    Alcohol/week: 0.0 standard drinks  . Drug use: No    Review of Systems Constitutional: Negative for fever. Cardiovascular: Mild right-sided chest tightness Respiratory: Positive for shortness of breath x1 week.  Mild cough. Gastrointestinal: Negative for abdominal pain, vomiting  Genitourinary: Negative for urinary compaints Musculoskeletal: Negative for leg pain or swelling. Skin: Negative for skin complaints  Neurological: Negative for headache All other ROS negative  ____________________________________________   PHYSICAL EXAM:  VITAL SIGNS: ED Triage Vitals  Enc Vitals Group     BP 10/23/18 1247 (!) 110/96     Pulse Rate 10/23/18 1247 80     Resp 10/23/18 1247 (!) 23     Temp 10/23/18 1247 98.2 F (36.8 C)     Temp Source 10/23/18 1247 Oral     SpO2 10/23/18 1247 97 %     Weight 10/23/18 1250 144 lb (65.3 kg)     Height 10/23/18 1250 5\' 10"  (1.778 m)     Head Circumference --      Peak Flow --      Pain Score 10/23/18 1249 0     Pain Loc --      Pain Edu? --      Excl. in [place]? --    Constitutional: Alert and oriented. Well appearing and in no distress. Eyes: Normal exam ENT   Head: Normocephalic and atraumatic.   Mouth/Throat: Mucous membranes are moist. Cardiovascular: Normal rate, regular rhythm. No  murmur Respiratory: No tachypnea, no significant respiratory effort.  Patient does have mild expiratory wheeze bilaterally with overall decreased breath sounds bilaterally as well. Gastrointestinal: Soft and  nontender. No distention.   Musculoskeletal: Nontender with normal range of motion in all extremities. No lower extremity tenderness or edema. Neurologic:  Normal speech and language. No gross focal neurologic deficits Skin:  Skin is warm, dry and intact.  Psychiatric: Mood and affect are normal.  ____________________________________________    EKG  EKG reviewed and interpreted by myself shows a normal sinus rhythm 86 bpm with a narrow QRS, normal axis, largely normal intervals with no concerning ST changes.  ____________________________________________    RADIOLOGY  Chest x-ray is negative  ____________________________________________   INITIAL IMPRESSION / ASSESSMENT AND PLAN / ED COURSE  Pertinent labs & imaging results that were available during my care of the patient were reviewed by me and considered in my medical decision making (see chart for details).  Patient presents to the emergency department for shortness of breath greater than 1 week.  Differential would include CHF, COPD exacerbation, pneumonia, pneumothorax, ACS, PE.  I reviewed the patient's records from 10/18/2018, patient had a an extensive work-up performed including CT angiography the chest that was negative besides a lymph node.  I reiterated with the patient that he needs follow-up in 6 months, he is aware of this.  We will recheck a chest x-ray, labs and continue to closely monitor.  Overall the patient appears well we will dose DuoNeb's, Solu-Medrol and reassess.  Highly suspect COPD exacerbation.  Chest x-ray is negative.  Overall the patient appears well, satting in the upper 90s on 2 L of oxygen which she has at home.  Patient's work-up including cardiac enzymes white blood cell count are normal.  Patient is feeling better after breathing treatments.  Received IV Solu-Medrol.  We will discharge with steroids, Zithromax and the patient will continue to use breathing treatments at home.  I discussed return  precautions for any worsening trouble breathing.  Patient agreeable to plan of care.  ____________________________________________   FINAL CLINICAL IMPRESSION(S) / ED DIAGNOSES  Dyspnea COPD exacerbation   Harvest Dark, MD 10/23/18 1357    Harvest Dark, MD 10/23/18 [id number]

## 2018-10-23 NOTE — ED Triage Notes (Signed)
Pt arrived via ems for report of shortness of breath over the last few weeks with several visits to PCP - the shortness of breath has gotten worse over the last 3 days and has required him to wear his prn oxygen continuously at 3L - pt took duoneb this am and inhaler - ems gave duoneb in route

## 2018-10-29 NOTE — Progress Notes (Deleted)
Leslie Sims  [phone] [phone]) [phone]  ID: Leslie Sims OB: 06-Nov-1951  MR#: [id number]  UYQ#:[id number]  Patient Care Team: [name], MD as PCP - General (Family Medicine) [name], MD (Family Medicine) Wellington Hampshire, MD as Consulting Physician (Cardiology) Telford Nab, RN as Registered Nurse  CHIEF COMPLAINT: Bilateral pulmonary nodules.  INTERVAL HISTORY: Patient returns to clinic today for further evaluation and discussion of his imaging results.  He continues to smoke heavily, but states he is cutting back significantly.  He currently feels well and is at his baseline.  He has chronic shortness of breath and requires oxygen, mainly at night and when he is active.  He has no neurologic complaints.  He has a good appetite and denies weight loss.  He denies any recent fevers. He denies any chest pain, cough, or hemoptysis.  He has no nausea, vomiting, constipation, or diarrhea.  No urinary complaints.  Patient offers no further specific complaints today.  REVIEW OF SYSTEMS:   Review of Systems  Constitutional: Negative.  Negative for fever, malaise/fatigue and weight loss.  Respiratory: Positive for shortness of breath. Negative for cough and hemoptysis.   Cardiovascular: Negative.  Negative for chest pain and leg swelling.  Gastrointestinal: Negative.  Negative for abdominal pain, blood in stool and melena.  Genitourinary: Negative.   Musculoskeletal: Negative.  Negative for joint pain.  Skin: Negative.  Negative for rash.  Neurological: Negative.  Negative for sensory change and weakness.  Psychiatric/Behavioral: Negative.  The patient is not nervous/anxious.     As per HPI. Otherwise, a complete review of systems is negative.  PAST MEDICAL HISTORY: Past Medical History:  Diagnosis Date  . Allergic rhinitis   . Anxiety   . Back pain   . Colon polyps 08/2012   colonoscopy; multiple colon polyps; repeat  colonoscopy in one year.  [name] COPD (chronic obstructive pulmonary disease) ([place])   . Coronary artery disease 11/2009   a. late presenting ant MI; b. LHC 100% pLAD s/p PCI/DES, 99% mRCA s/p PCI/DES, EF 35%; c. nuclear stress test 05/13: prior ant/inf infarcts w/o ischemia, EF 43%; d. LHC 02/14: widely patent stents with no other obs dz, EF 40%; e. LHC 11/17: LM nl, mLAD 10%, patent LAD stent, dLAD 20%, p-mRCA 10%, mRCA 40%, patent RCA stent, EF 35-45%   . Depression   . GERD (gastroesophageal reflux disease)   . Headache(784.0)   . Helicobacter pylori (H. pylori)   . Hemorrhoids   . Hernia    inguinal  . Hyperlipidemia   . Hypertension   . Insomnia   . Ischemic cardiomyopathy   . Myalgia   . Peptic ulcer   . Pulmonary nodules   . ST elevation (STEMI) myocardial infarction involving left anterior descending coronary artery ([place]) 11/2009   a. s/p PCI to the LAD  . Status post dilation of esophageal narrowing 2000  . Tinea pedis   . Wears dentures    partial upper    PAST SURGICAL HISTORY: Past Surgical History:  Procedure Laterality Date  . Admission  12/20/2012   COPD exacerbation.  [place].  [name] CARDIAC CATHETERIZATION    . CARDIAC CATHETERIZATION  02/05/13   [facility]  . CARDIAC CATHETERIZATION  10/14   [facility] : patent stents with no change in anatomy. EF: 40$  . CARDIAC CATHETERIZATION Left 11/12/2016   Procedure: Left Heart Cath and Coronary Angiography;  Surgeon: Wellington Hampshire, MD;  Location: [facility];  Service: Cardiovascular;  Laterality: Left;  . COLONOSCOPY    . COLONOSCOPY WITH PROPOFOL N/A 05/18/2016   Procedure: COLONOSCOPY WITH PROPOFOL;  Surgeon: Lucilla Lame, MD;  Location: [facility]COPY;  Service: Endoscopy;  Laterality: N/A;  . CORONARY ANGIOPLASTY WITH STENT PLACEMENT  09/2009   LAD 3.0 X23 mm Xience DES, RCA: 4.0 X 15 mm Xience DES  . ELECTROMAGNETIC NAVIGATION BROCHOSCOPY N/A 10/27/2017   Procedure: ELECTROMAGNETIC NAVIGATION BRONCHOSCOPY;  Surgeon: Flora Lipps, MD;  Location: [facility] OR[facility];  Service: Cardiopulmonary;  Laterality: N/A;  . ESOPHAGEAL DILATION    . ESOPHAGOGASTRODUODENOSCOPY  2008  . ESOPHAGOGASTRODUODENOSCOPY  08/20/2012  . HERNIA REPAIR  07/20/2012   L inguinal hernia repair  . PENILE PROSTHESIS IMPLANT      FAMILY HISTORY: Family History  Problem Relation Age of Onset  . Heart attack Brother        Brother #1  . Diabetes Brother   . Hypertension Brother        #3  . Coronary artery disease Father 30       deceased  . Heart attack Father   . Diabetes Father   . Heart disease Father   . COPD Mother 94       deceased  . Alcohol abuse Sister        polysubstance abuse  . COPD Sister   . Lung cancer Sister   . Alcohol abuse Sister        polysubstance abuse  . Penile cancer Brother   . Diabetes Brother     ADVANCED DIRECTIVES (Y/N):  N  HEALTH MAINTENANCE: Social History   Tobacco Use  . Smoking status: Current Every Day Smoker    Packs/day: 1.00    Years: 42.00    Pack years: 42.00    Types: Cigarettes  . Smokeless tobacco: Never Used  Substance Use Topics  . Alcohol use: No    Alcohol/week: 0.0 standard drinks  . Drug use: No     Colonoscopy:  PAP:  Bone density:  Lipid panel:  Allergies  Allergen Reactions  . Prozac [Fluoxetine Hcl] Shortness Of Breath  . Effexor Xr [Venlafaxine Hcl Er] Other (See Comments)    "Makes me feel funny"  . Wellbutrin [Bupropion] Other (See Comments)    "Makes me feel funny"    Current Outpatient Medications  Medication Sig Dispense Refill  . acetaminophen (TYLENOL) 325 MG tablet Take 2 tablets (650 mg total) by mouth every 6 (six) hours as needed for mild pain (or Fever >/= 101).    [name] albuterol (VENTOLIN HFA) 108 (90 Base) MCG/ACT inhaler USE 2 INHALATIONS EVERY 6 HOURS AS NEEDED FOR WHEEZING 48 g 3  . ARIPiprazole (ABILIFY) 5 MG tablet Take 5 mg by mouth daily.  0  . aspirin 81 MG tablet Take 81 mg by mouth daily.      [name] atorvastatin (LIPITOR) 40 MG tablet  Take 1 tablet (40 mg total) by mouth daily. 90 tablet 3  . ciprofloxacin (CIPRO) 500 MG tablet Take 1 tablet (500 mg total) by mouth 2 (two) times daily. 10 tablet 0  . feeding supplement, ENSURE ENLIVE, (ENSURE ENLIVE) LIQD Take 237 mLs by mouth 3 (three) times daily between meals. 237 mL 12  . Fluticasone-Salmeterol (ADVAIR DISKUS) 500-50 MCG/DOSE AEPB Inhale 1 puff into the lungs 2 (two) times daily. 180 each 3  . furosemide (LASIX) 20 MG tablet Take 1 tablet (20 mg total) by mouth daily. 90 tablet 3  . hydrOXYzine (VISTARIL) 25 MG capsule Take 25 mg  by mouth 2 (two) times daily as needed.  0  . ipratropium-albuterol (DUONEB) 0.5-2.5 (3) MG/3ML SOLN Take 3 mLs by nebulization every 4 (four) hours as needed. 1620 mL 3  . LORazepam (ATIVAN) 1 MG tablet Take 1 tablet (1 mg total) by mouth every 8 (eight) hours as needed for anxiety. 90 tablet 5  . losartan (COZAAR) 25 MG tablet TAKE 1 TABLET DAILY 90 tablet 3  . mirtazapine (REMERON) 30 MG tablet Take 1 tablet (30 mg total) by mouth at bedtime. 30 tablet 5  . Multiple Vitamin (MULTIVITAMIN) capsule Take 1 capsule by mouth daily.    . nitroGLYCERIN (NITROSTAT) 0.4 MG SL tablet Place 1 tablet (0.4 mg total) under the tongue every 5 (five) minutes as needed. 25 tablet 3  . predniSONE (DELTASONE) 20 MG tablet Take 2 tablets (40 mg total) by mouth daily. 10 tablet 0  . sertraline (ZOLOFT) 100 MG tablet Take 100 mg by mouth daily.   2  . SPIRIVA HANDIHALER 18 MCG inhalation capsule PLACE 1 CAPSULE INTO INHALER AND INHALE THE CONTENTS DAILY 90 capsule 3  . sucralfate (CARAFATE) 1 GM/10ML suspension Take 10 mLs (1 g total) by mouth 4 (four) times daily -  with meals and at bedtime. (Patient taking differently: Take 1 g by mouth 4 (four) times daily as needed. ) 3600 mL 3   No current facility-administered medications for this visit.     OBJECTIVE: There were no vitals filed for this visit.   There is no height or weight on file to calculate BMI.    ECOG  FS:0 - Asymptomatic  General: Well-developed, well-nourished, no acute distress. Eyes: Pink conjunctiva, anicteric sclera. Lungs: Clear to auscultation bilaterally. Heart: Regular rate and rhythm. No rubs, murmurs, or gallops. Abdomen: Soft, nontender, nondistended. No organomegaly noted, normoactive bowel sounds. Musculoskeletal: No edema, cyanosis, or clubbing. Neuro: Alert, answering all questions appropriately. Cranial nerves grossly intact. Skin: No rashes or petechiae noted. Psych: Normal affect.  LAB RESULTS:  Lab Results  Component Value Date   NA 142 10/23/2018   K 4.1 10/23/2018   CL 97 (L) 10/23/2018   CO2 35 (H) 10/23/2018   GLUCOSE 93 10/23/2018   BUN 9 10/23/2018   CREATININE 0.78 10/23/2018   CALCIUM 9.3 10/23/2018   PROT 7.1 10/18/2018   ALBUMIN 3.9 10/18/2018   AST 16 10/18/2018   ALT 18 10/18/2018   ALKPHOS 68 10/18/2018   BILITOT 0.5 10/18/2018   GFRNONAA >60 10/23/2018   GFRAA >60 10/23/2018    Lab Results  Component Value Date   WBC 6.0 10/23/2018   NEUTROABS 5.8 05/22/2018   HGB 13.7 10/23/2018   HCT 43.6 10/23/2018   MCV 94.0 10/23/2018   PLT 251 10/23/2018     STUDIES: Dg Chest 2 View  Result Date: 10/18/2018 CLINICAL DATA:  Chest pain EXAM: CHEST - 2 VIEW COMPARISON:  09/29/2018, chest CT 09/13/2018 FINDINGS: Emphysematous hyperinflation of lungs. No acute pulmonary consolidation or CHF. No effusion or pneumothorax. Mild aortic atherosclerosis at the arch without aneurysm. Normal size heart. Axillary lymph node on right, calcified in appearance. IMPRESSION: 1. COPD. 2. Aortic atherosclerosis. 3. Left upper lobe pulmonary nodule noted previously on CT and radiographs is not well visualized on current exam. Electronically Signed   By: Ashley Royalty M.D.   On: 10/18/2018 20:26   Dg Chest 2 View  Result Date: 09/29/2018 CLINICAL DATA:  Increasing dyspnea and cough x4 days EXAM: CHEST - 2 VIEW COMPARISON:  09/13/2018 CT and CXR  FINDINGS:  Emphysematous hyperinflation of the lungs. Normal heart size with minimal aortic atherosclerosis. Stable 1 cm nodule projects over the left upper lobe. No pulmonary consolidation or overt pulmonary edema. No effusion. Mild anterior eventration of the left hemidiaphragm with bowel projecting beneath it as before. No aggressive osseous lesions. Calcified lymph node in the right axilla. IMPRESSION: COPD with minimal aortic atherosclerosis. No active pulmonary disease. Calcified nodule left upper lobe measuring approximate 1 cm. Other ancillary findings as above. Electronically Signed   By: Ashley Royalty M.D.   On: 09/29/2018 18:03   Ct Angio Chest Pe W/cm &/or Wo Cm  Result Date: 10/19/2018 CLINICAL DATA:  Constant chest pain right side 4 days with cough. Oxygen dependent. Shortness of breath. EXAM: CT ANGIOGRAPHY CHEST WITH CONTRAST TECHNIQUE: Multidetector CT imaging of the chest was performed using the standard protocol during bolus administration of intravenous contrast. Multiplanar CT image reconstructions and MIPs were obtained to evaluate the vascular anatomy. CONTRAST:  53mL ISOVUE-370 IOPAMIDOL (ISOVUE-370) INJECTION 76% COMPARISON:  09/13/2018, 02/01/2018 and 12/07/2017 FINDINGS: Cardiovascular: Heart is normal size. There is calcified plaque over the left anterior descending and right coronary arteries. Minimal calcified plaque over the thoracic aorta. Pulmonary arterial system is well opacified without emboli. Mediastinum/Nodes: No mediastinal or hilar adenopathy. Lungs/Pleura: Lungs are adequately inflated with moderate centrilobular emphysematous disease. No focal airspace consolidation or effusion. Stable 4 mm nodule over the left apex. Stable 8 x 10 mm nodule over the left upper lobe which is actually decreased in size compared to 10/04/2017. Stable left lower lobe nodule measuring 5 mm. Stable 8 x 12 mm nodule over the right upper lobe actually decreased in size compared to 10/04/2017. New 7 mm  nodule over the right upper lobe. Remainder of lungs are unchanged. Airways are normal. Upper Abdomen: No acute findings. Mild calcified plaque over the abdominal aorta. 2.6 cm left adrenal adenoma unchanged. Musculoskeletal: Minimal degenerative change of the spine. Review of the MIP images confirms the above findings. IMPRESSION: No acute cardiopulmonary disease and no evidence of pulmonary embolism. Bilateral pulmonary nodules as described which are stable or decreased in size compared to 10/04/2017. One new 7 mm nodule over the right upper lobe. Recommend follow-up CT 6 months. This recommendation follows the consensus statement: Guidelines for Management of Small Pulmonary Nodules Detected on CT Scans: A Statement from the [employer] as published in Radiology 2005; 237:395-400. Online at: https://www.arnold.com/. Aortic Atherosclerosis (ICD10-I70.0) and Emphysema (ICD10-J43.9). Atherosclerotic coronary artery disease. 2.6 cm left adrenal adenoma. Electronically Signed   By: Marin Olp M.D.   On: 10/19/2018 02:01   Dg Chest Portable 1 View  Result Date: 10/23/2018 CLINICAL DATA:  Shortness of breath and cough for 3-4 days. EXAM: PORTABLE CHEST 1 VIEW COMPARISON:  PA and lateral chest 10/18/2018 and 12/23/2017. CT chest 10/19/2018. FINDINGS: The lungs are emphysematous without consolidative process. Right upper lobe scar and a nodule in the left upper lobe are noted as seen on the prior exams. Heart size is normal. No pneumothorax or pleural effusion. No acute or focal bony abnormality. IMPRESSION: No acute disease. Emphysema. Electronically Signed   By: Inge Rise M.D.   On: 10/23/2018 13:09   US Abdomen Limited Ruq  Result Date: 10/19/2018 CLINICAL DATA:  Initial evaluation for acute right upper quadrant pain. EXAM: ULTRASOUND ABDOMEN LIMITED RIGHT UPPER QUADRANT COMPARISON:  Prior PET-CT from 10/13/2017. FINDINGS: Gallbladder: No stones or sludge seen  within the gallbladder lumen. Gallbladder wall measure within normal limits at 2.8 mm in  thickness. No free pericholecystic fluid. No sonographic Murphy sign elicited on exam. Common bile duct: Diameter: 10 mm Liver: 1.1 x 1.5 x 1.3 cm well-circumscribed echogenic lesion within the right hepatic lobe. Adjacent 8 x 7 x 8 mm echogenic focus noted within the right hepatic lobe. Findings are indeterminate, but most characteristic for small hemangiomas. 1.3 x 1.4 x 1.1 cm simple cyst noted near the porta hepatis. Within normal limits in parenchymal echogenicity. Portal vein is patent on color Doppler imaging with normal direction of blood flow towards the liver. 2.8 cm lesion seen arising from the right adrenal gland, likely a small adenoma as seen on prior PET-CT. IMPRESSION: 1. No sonographic evidence for cholelithiasis or acute cholecystitis. 2. Mild dilatation of the common bile duct up to 10 mm, of uncertain significance. Correlation with laboratory values suggested. No obstructive choledocholithiasis identified by sonography. Further assessment with dedicated MRI/MRCP could be performed for further evaluation as clinically warranted. 3. Few small echogenic lesions within the right hepatic lobe measuring up to 1.5 cm, nonspecific, but most likely small hemangiomas. 4. 1.4 cm simple hepatic cyst near the porta hepatis. Electronically Signed   By: Jeannine Boga M.D.   On: 10/19/2018 00:14    ASSESSMENT: Bilateral pulmonary nodule.  PLAN:    1. Bilateral pulmonary nodule: Previously, patient underwent biopsies of both lesions on October 27, 2017 that were negative for malignancies.  CT scan results from Apr 28, 2018 reviewed independently and report as above with interval improvement of both lesions.  No intervention is needed at this time.  Will repeat CT scans in 6 months to assess for continued interval change.  If lesions increase in size, will consider repeat biopsy or possibly treatment with SBRT.   Return to clinic 1 to 2 days after his imaging for further evaluation and discussion of the results. 2.  Smoking cessation: Patient says he continues to cut back his tobacco use.  Approximately 20 minutes was spent in discussion of which greater than 50% was consultation.  Patient expressed understanding and was in agreement with this plan. He also understands that He can call clinic at any time with any questions, concerns, or complaints.   Cancer Staging No matching staging information was found for the patient.  Lloyd Huger, MD   10/29/2018 1:01 PM

## 2018-10-30 ENCOUNTER — Ambulatory Visit: Payer: Medicare Other

## 2018-11-01 ENCOUNTER — Other Ambulatory Visit: Payer: Self-pay

## 2018-11-01 ENCOUNTER — Emergency Department
Admission: EM | Admit: 2018-11-01 | Discharge: 2018-11-01 | Disposition: A | Discharge status: Home / Self Care | Payer: Medicare Other | Attending: Emergency Medicine | Admitting: Emergency Medicine

## 2018-11-01 ENCOUNTER — Emergency Department: Payer: Medicare Other

## 2018-11-01 DIAGNOSIS — I110 Hypertensive heart disease with heart failure: Secondary | ICD-10-CM | POA: Diagnosis not present

## 2018-11-01 DIAGNOSIS — Z79899 Other long term (current) drug therapy: Secondary | ICD-10-CM | POA: Insufficient documentation

## 2018-11-01 DIAGNOSIS — I25110 Atherosclerotic heart disease of native coronary artery with unstable angina pectoris: Secondary | ICD-10-CM | POA: Diagnosis not present

## 2018-11-01 DIAGNOSIS — Z7982 Long term (current) use of aspirin: Secondary | ICD-10-CM | POA: Insufficient documentation

## 2018-11-01 DIAGNOSIS — R0602 Shortness of breath: Secondary | ICD-10-CM | POA: Diagnosis present

## 2018-11-01 DIAGNOSIS — Z85038 Personal history of other malignant neoplasm of large intestine: Secondary | ICD-10-CM | POA: Diagnosis not present

## 2018-11-01 DIAGNOSIS — F17210 Nicotine dependence, cigarettes, uncomplicated: Secondary | ICD-10-CM | POA: Diagnosis not present

## 2018-11-01 DIAGNOSIS — Z9981 Dependence on supplemental oxygen: Secondary | ICD-10-CM | POA: Diagnosis not present

## 2018-11-01 DIAGNOSIS — J40 Bronchitis, not specified as acute or chronic: Secondary | ICD-10-CM | POA: Insufficient documentation

## 2018-11-01 DIAGNOSIS — I5022 Chronic systolic (congestive) heart failure: Secondary | ICD-10-CM | POA: Diagnosis not present

## 2018-11-01 LAB — TROPONIN I

## 2018-11-01 LAB — BASIC METABOLIC PANEL
ANION GAP: 12 (ref 5–15)
BUN: 10 mg/dL (ref 8–23)
CHLORIDE: 98 mmol/L (ref 98–111)
CO2: 31 mmol/L (ref 22–32)
Calcium: 9 mg/dL (ref 8.9–10.3)
Creatinine, Ser: 0.76 mg/dL (ref 0.61–1.24)
Glucose, Bld: 108 mg/dL — ABNORMAL HIGH (ref 70–99)
POTASSIUM: 3.6 mmol/L (ref 3.5–5.1)
SODIUM: 141 mmol/L (ref 135–145)

## 2018-11-01 LAB — CBC
HEMATOCRIT: 43.6 % (ref 39.0–52.0)
HEMOGLOBIN: 14.2 g/dL (ref 13.0–17.0)
MCH: 29.8 pg (ref 26.0–34.0)
MCHC: 32.6 g/dL (ref 30.0–36.0)
MCV: 91.6 fL (ref 80.0–100.0)
NRBC: 0 % (ref 0.0–0.2)
Platelets: 264 10*3/uL (ref 150–400)
RBC: 4.76 MIL/uL (ref 4.22–5.81)
RDW: 12.3 % (ref 11.5–15.5)
WBC: 7.7 10*3/uL (ref 4.0–10.5)

## 2018-11-01 LAB — BRAIN NATRIURETIC PEPTIDE: B Natriuretic Peptide: 14 pg/mL (ref 0.0–100.0)

## 2018-11-01 IMAGING — CR DG CHEST 2V
2 series · 2 of 2 positions shown · non-contrast
Comparison: Prior chest x-ray [DATE]

CLINICAL DATA: 67-year-old male with respiratory distress. Recent
hospitalization for COPD exacerbation

EXAM:
CHEST - 2 VIEW

[chest pa]
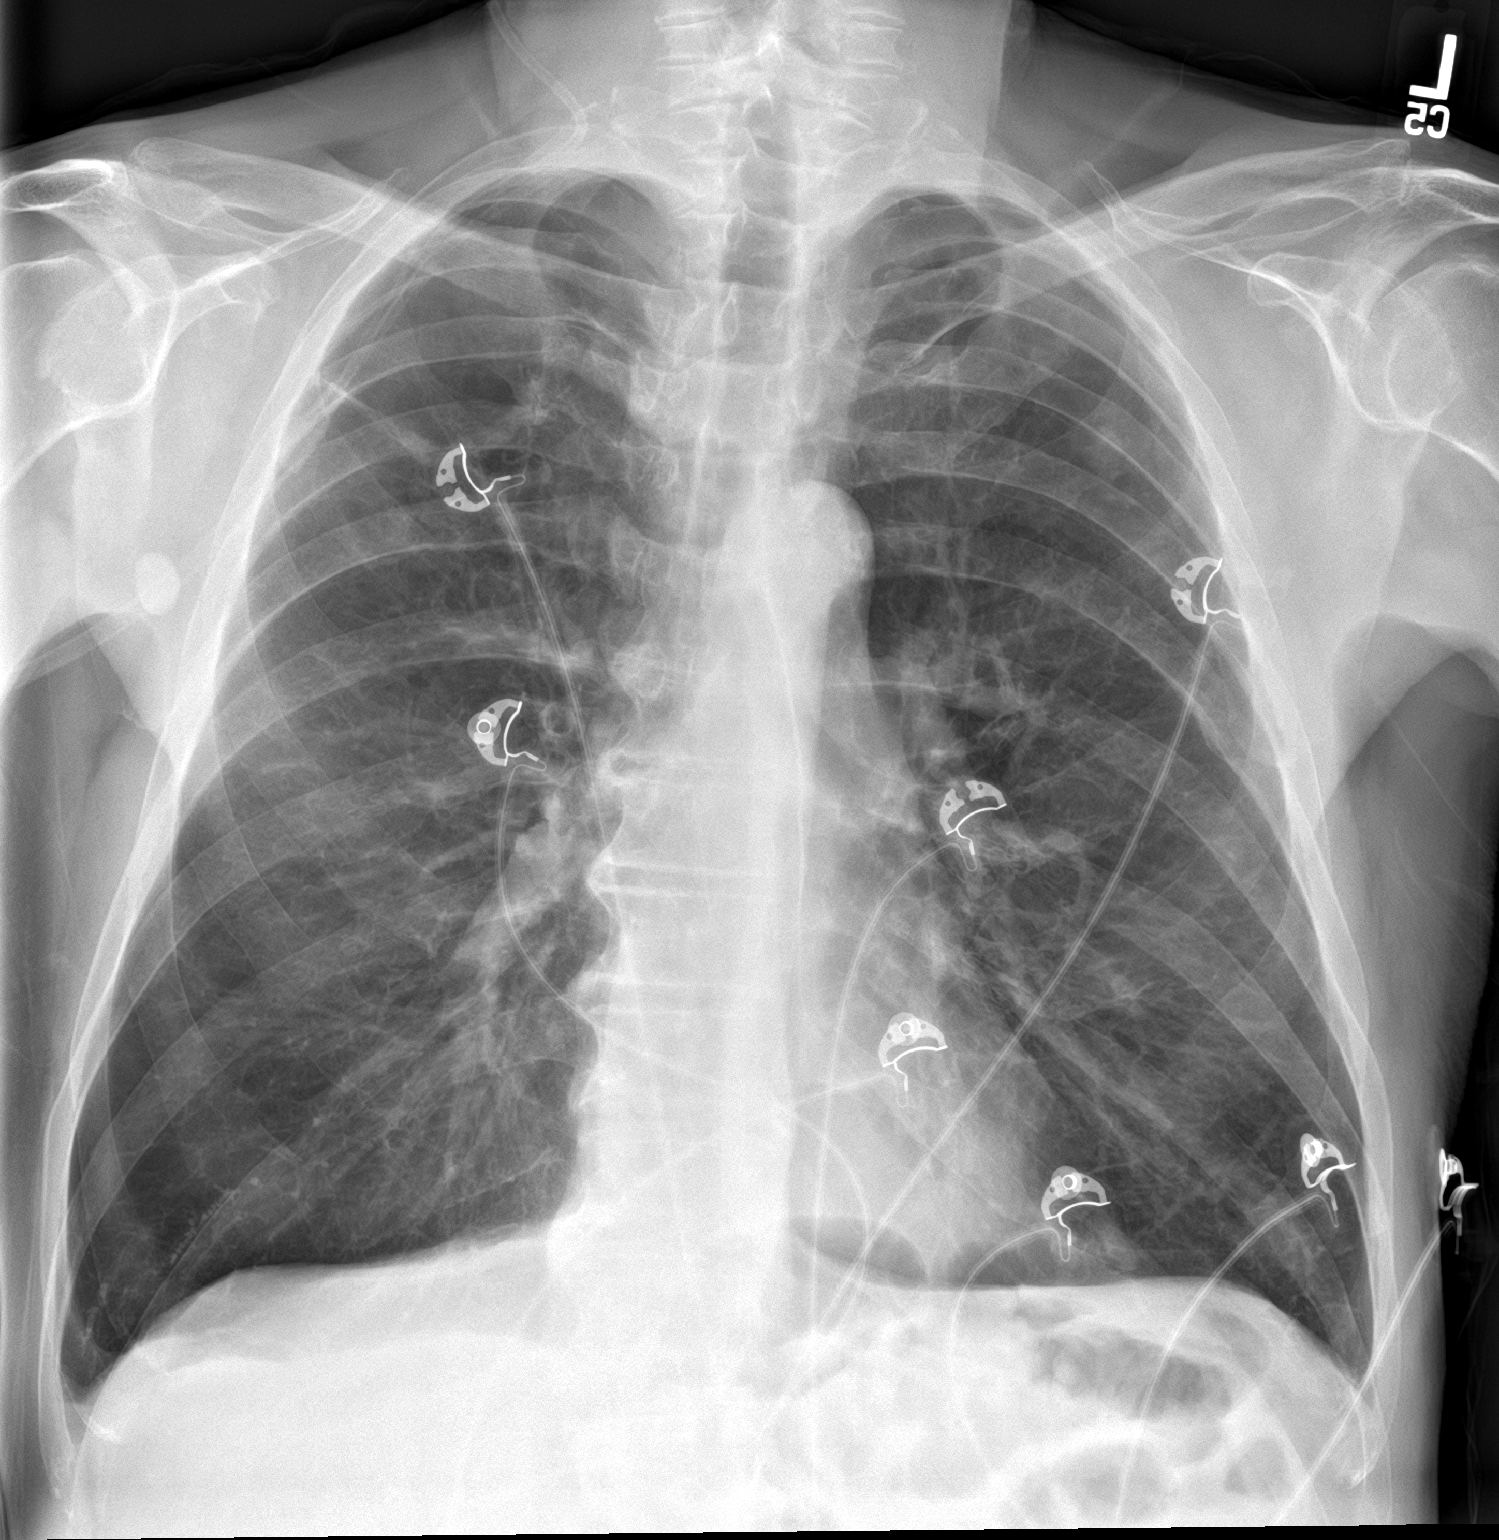

[chest lat]
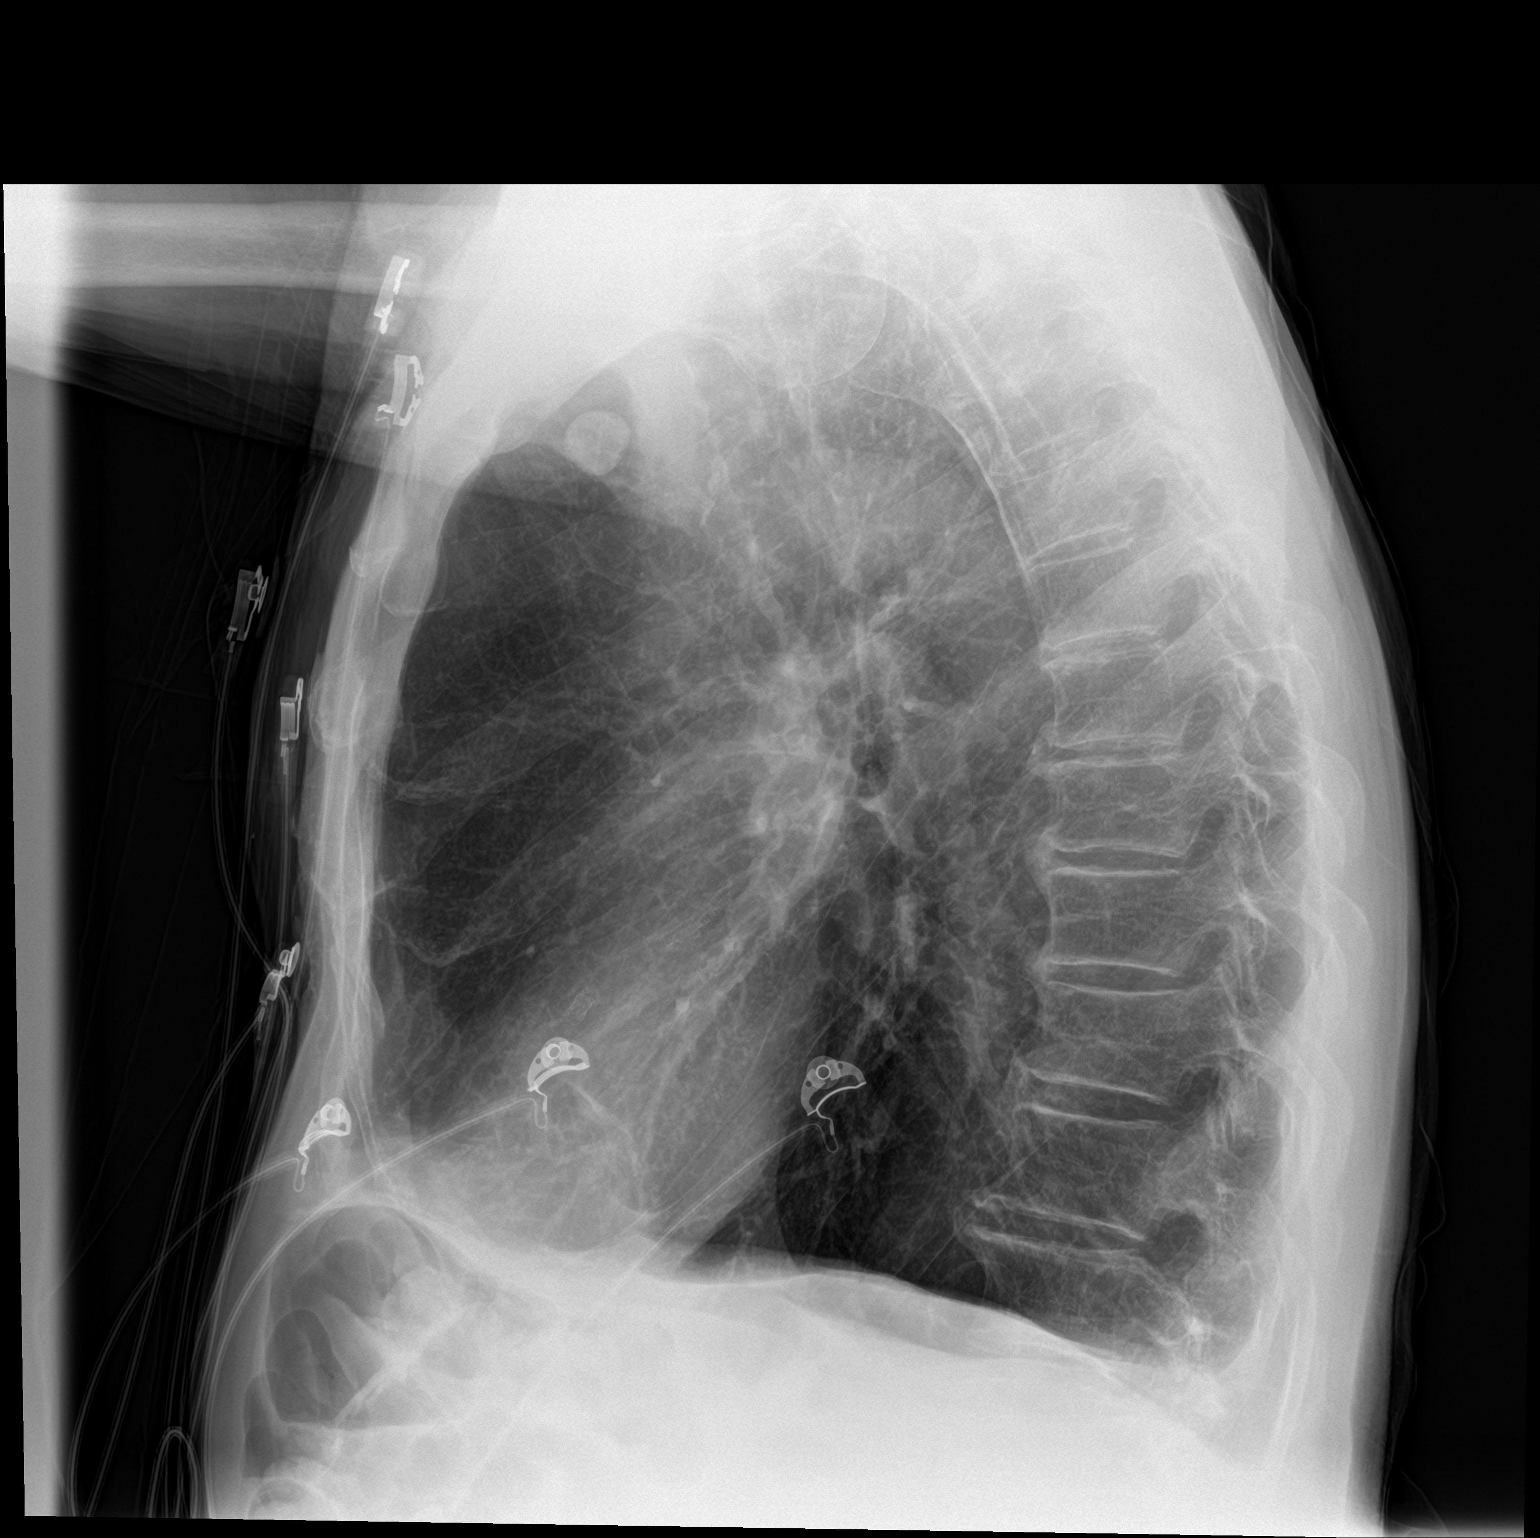

[2 of 2 positions shown; findings below may reference images not displayed]

FINDINGS: Stable cardiac and mediastinal contours. Atherosclerotic
calcifications evident in the transverse aorta. The lungs are
hyperinflated with chronic bronchitic changes and underlying
pulmonary emphysema consistent with the clinical history of COPD. No
suspicious pulmonary mass or nodule. Right upper lung scarring again
noted. No acute osseous abnormality. No pleural effusion or
pneumothorax.
IMPRESSION: Stable chest x-ray without evidence of acute cardiopulmonary
process.

COPD, Aortic Atherosclerosis ([CB]-170.0)

## 2018-11-01 MED ORDER — ALBUTEROL SULFATE HFA 108 (90 BASE) MCG/ACT IN AERS: 2.0000 | INHALATION_SPRAY | Freq: Four times a day (QID) | RESPIRATORY_TRACT | 2 refills | Status: DC | PRN

## 2018-11-01 MED ORDER — PREDNISONE 20 MG PO TABS: 60.0000 mg | ORAL_TABLET | Freq: Every day | ORAL | 0 refills | Status: AC

## 2018-11-01 MED ORDER — IPRATROPIUM-ALBUTEROL 0.5-2.5 (3) MG/3ML IN SOLN
3.0000 mL | Freq: Once | RESPIRATORY_TRACT | Status: AC
  Administered 2018-11-01: 3 mL via RESPIRATORY_TRACT
  Filled 2018-11-01: qty 3

## 2018-11-01 MED ORDER — DOXYCYCLINE HYCLATE 100 MG PO CAPS: 100.0000 mg | ORAL_CAPSULE | Freq: Two times a day (BID) | ORAL | 0 refills | Status: AC

## 2018-11-01 MED ORDER — DOXYCYCLINE HYCLATE 100 MG PO TABS
100.0000 mg | ORAL_TABLET | Freq: Once | ORAL | Status: AC
  Administered 2018-11-01: 100 mg via ORAL
  Filled 2018-11-01: qty 1

## 2018-11-01 NOTE — ED Triage Notes (Signed)
Pt arrived via EMS from home c/o respiratory distress. Pt states that he was recently d/c'd from the hospital 11/4 for COPD exacerbation. Pt received 2 duonebs and 125mg  of solumedrol en route.

## 2018-11-01 NOTE — ED Provider Notes (Signed)
[facility] Emergency Department Provider Note  ____________________________________________  Time seen: Approximately 6:25 PM  I have reviewed the triage vital signs and the nursing notes.   HISTORY  Chief Complaint Shortness of Breath   HPI Leslie Sims is a 67 y.o. male with a history of CAD status post several stents, COPD on 2 L nasal cannula, smoking, HTN, HLD, CHF with EF 40-45% who presents for evaluation of shortness of breath.patient with several prior admissions for COPD, last one a month ago. Now has had 2 days of cough productive of green sputum, progressively worsening shortness of breath. No chest pain, no abdominal pain, no nausea or vomiting, no diarrhea, no fever or chills. Patient continues to smoke. Has used his inhaler today minimal relief. Patient was found hypoxic per EMS. Received 125 mg of Solu-Medrol and to do her nebs in route. He reports feeling improved at this time. No personal or family history of PE or DVT, and no leg pain or swelling, no hemoptysis.  Past Medical History:  Diagnosis Date  . Allergic rhinitis   . Anxiety   . Back pain   . Colon polyps 08/2012   colonoscopy; multiple colon polyps; repeat colonoscopy in one year.  [name] COPD (chronic obstructive pulmonary disease) ([place])   . Coronary artery disease 11/2009   a. late presenting ant MI; b. LHC 100% pLAD s/p PCI/DES, 99% mRCA s/p PCI/DES, EF 35%; c. nuclear stress test 05/13: prior ant/inf infarcts w/o ischemia, EF 43%; d. LHC 02/14: widely patent stents with no other obs dz, EF 40%; e. LHC 11/17: LM nl, mLAD 10%, patent LAD stent, dLAD 20%, p-mRCA 10%, mRCA 40%, patent RCA stent, EF 35-45%   . Depression   . GERD (gastroesophageal reflux disease)   . Headache(784.0)   . Helicobacter pylori (H. pylori)   . Hemorrhoids   . Hernia    inguinal  . Hyperlipidemia   . Hypertension   . Insomnia   . Ischemic cardiomyopathy   . Myalgia   . Peptic ulcer   .  Pulmonary nodules   . ST elevation (STEMI) myocardial infarction involving left anterior descending coronary artery ([place]) 11/2009   a. s/p PCI to the LAD  . Status post dilation of esophageal narrowing 2000  . Tinea pedis   . Wears dentures    partial upper    Patient Active Problem List   Diagnosis Date Noted  . Acute respiratory failure with hypoxia ([place]) 09/29/2018  . Acute respiratory failure with hypoxia and hypercapnia ([place]) 09/13/2018  . Acute on chronic respiratory failure with hypoxia and hypercapnia ([place]) 08/24/2018  . Chest pain 07/02/2018  . Grief reaction 01/13/2018  . COPD with hypoxia ([place]) 12/25/2017  . Unstable angina ([place])   . Benign neoplasm of ascending colon   . Benign neoplasm of descending colon   . Benign neoplasm of sigmoid colon   . Personal history of colonic polyps   . Pulmonary nodules/lesions, multiple 01/05/2015  . Bruit of left carotid artery 11/04/2014  . Sleep disorder 07/18/2013  . Seasonal and perennial allergic rhinitis 05/29/2013  . Bradycardia 04/20/2011  . SMOKER 07/30/2010  . CAD, NATIVE VESSEL 04/23/2010  . Hyperlipidemia 03/24/2010  . DEPRESSION/ANXIETY 03/24/2010  . Chronic systolic heart failure ([place]) 03/24/2010  . COPD, very severe ([place]) 03/24/2010  . GERD 03/24/2010  . Essential hypertension 11/21/2009    Past Surgical History:  Procedure Laterality Date  . Admission  12/20/2012   COPD exacerbation.  [place].  [name]  CARDIAC CATHETERIZATION    . CARDIAC CATHETERIZATION  02/05/13   [facility]  . CARDIAC CATHETERIZATION  10/14   [facility] : patent stents with no change in anatomy. EF: 40$  . CARDIAC CATHETERIZATION Left 11/12/2016   Procedure: Left Heart Cath and Coronary Angiography;  Surgeon: Wellington Hampshire, MD;  Location: [place] [facility];  Service: Cardiovascular;  Laterality: Left;  . COLONOSCOPY    . COLONOSCOPY WITH PROPOFOL N/A 05/18/2016   Procedure: COLONOSCOPY WITH PROPOFOL;  Surgeon: Lucilla Lame, MD;  Location: [facility] ENDOSCOPY;   Service: Endoscopy;  Laterality: N/A;  . CORONARY ANGIOPLASTY WITH STENT PLACEMENT  09/2009   LAD 3.0 X23 mm Xience DES, RCA: 4.0 X 15 mm Xience DES  . ELECTROMAGNETIC NAVIGATION BROCHOSCOPY N/A 10/27/2017   Procedure: ELECTROMAGNETIC NAVIGATION BRONCHOSCOPY;  Surgeon: Flora Lipps, MD;  Location: [facility] ORS;  Service: Cardiopulmonary;  Laterality: N/A;  . ESOPHAGEAL DILATION    . ESOPHAGOGASTRODUODENOSCOPY  2008  . ESOPHAGOGASTRODUODENOSCOPY  08/20/2012  . HERNIA REPAIR  07/20/2012   L inguinal hernia repair  . PENILE PROSTHESIS IMPLANT      Prior to Admission medications   Medication Sig Start Date End Date Taking? Authorizing Provider  acetaminophen (TYLENOL) 325 MG tablet Take 2 tablets (650 mg total) by mouth every 6 (six) hours as needed for mild pain (or Fever >/= 101). 08/26/18   Gouru, Aruna, MD  albuterol (PROVENTIL HFA;VENTOLIN HFA) 108 (90 Base) MCG/ACT inhaler Inhale 2 puffs into the lungs every 6 (six) hours as needed for wheezing or shortness of breath. 11/01/18   Rudene Re, MD  ARIPiprazole (ABILIFY) 5 MG tablet Take 5 mg by mouth daily. 07/26/18   [provider]  aspirin 81 MG tablet Take 81 mg by mouth daily.      [provider]  atorvastatin (LIPITOR) 40 MG tablet Take 1 tablet (40 mg total) by mouth daily. 03/03/18   [name], MD  ciprofloxacin (CIPRO) 500 MG tablet Take 1 tablet (500 mg total) by mouth 2 (two) times daily. 10/01/18   Epifanio Lesches, MD  doxycycline (VIBRAMYCIN) 100 MG capsule Take 1 capsule (100 mg total) by mouth 2 (two) times daily for 7 days. 11/01/18 11/08/18  Rudene Re, MD  feeding supplement, ENSURE ENLIVE, (ENSURE ENLIVE) LIQD Take 237 mLs by mouth 3 (three) times daily between meals. 10/01/18   Epifanio Lesches, MD  Fluticasone-Salmeterol (ADVAIR DISKUS) 500-50 MCG/DOSE AEPB Inhale 1 puff into the lungs 2 (two) times daily. 07/27/17   [name], MD  furosemide (LASIX) 20 MG tablet Take 1 tablet (20 mg  total) by mouth daily. 07/07/18 09/30/19  [name], MD  hydrOXYzine (VISTARIL) 25 MG capsule Take 25 mg by mouth 2 (two) times daily as needed. 06/05/18   [provider]  ipratropium-albuterol (DUONEB) 0.5-2.5 (3) MG/3ML SOLN Take 3 mLs by nebulization every 4 (four) hours as needed. 06/24/18   [name], MD  LORazepam (ATIVAN) 1 MG tablet Take 1 tablet (1 mg total) by mouth every 8 (eight) hours as needed for anxiety. 07/07/18   [name], MD  losartan (COZAAR) 25 MG tablet TAKE 1 TABLET DAILY 01/27/18   Minna Merritts, MD  mirtazapine (REMERON) 30 MG tablet Take 1 tablet (30 mg total) by mouth at bedtime. 06/24/18   [name], MD  Multiple Vitamin (MULTIVITAMIN) capsule Take 1 capsule by mouth daily.    [provider]  nitroGLYCERIN (NITROSTAT) 0.4 MG SL tablet Place 1 tablet (0.4 mg total)  under the tongue every 5 (five) minutes as needed. 10/17/17   Wellington Hampshire, MD  predniSONE (DELTASONE) 20 MG tablet Take 3 tablets (60 mg total) by mouth daily for 4 days. 11/01/18 11/05/18  Rudene Re, MD  sertraline (ZOLOFT) 100 MG tablet Take 100 mg by mouth daily.  06/28/18   [provider]  SPIRIVA HANDIHALER 18 MCG inhalation capsule PLACE 1 CAPSULE INTO INHALER AND INHALE THE CONTENTS DAILY 07/26/18   Trinna Post, PA-C  sucralfate (CARAFATE) 1 GM/10ML suspension Take 10 mLs (1 g total) by mouth 4 (four) times daily -  with meals and at bedtime. Patient taking differently: Take 1 g by mouth 4 (four) times daily as needed.  05/16/18   [name], MD    Allergies Prozac [fluoxetine hcl]; Effexor xr [venlafaxine hcl er]; and Wellbutrin [bupropion]  Family History  Problem Relation Age of Onset  . Heart attack Brother        Brother #1  . Diabetes Brother   . Hypertension Brother        #3  . Coronary artery disease Father 15       deceased  . Heart attack Father   . Diabetes Father   . Heart disease Father   . COPD Mother 106        deceased  . Alcohol abuse Sister        polysubstance abuse  . COPD Sister   . Lung cancer Sister   . Alcohol abuse Sister        polysubstance abuse  . Penile cancer Brother   . Diabetes Brother     Social History Social History   Tobacco Use  . Smoking status: Current Every Day Smoker    Packs/day: 1.00    Years: 42.00    Pack years: 42.00    Types: Cigarettes  . Smokeless tobacco: Never Used  Substance Use Topics  . Alcohol use: No    Alcohol/week: 0.0 standard drinks  . Drug use: No    Review of Systems  Constitutional: Negative for fever. Eyes: Negative for visual changes. ENT: Negative for sore throat. Neck: No neck pain  Cardiovascular: Negative for chest pain. Respiratory: + shortness of breath, cough Gastrointestinal: Negative for abdominal pain, vomiting or diarrhea. Genitourinary: Negative for dysuria. Musculoskeletal: Negative for back pain. Skin: Negative for rash. Neurological: Negative for headaches, weakness or numbness. Psych: No SI or HI  ____________________________________________   PHYSICAL EXAM:  VITAL SIGNS: ED Triage Vitals  Enc Vitals Group     BP 11/01/18 1706 121/69     Pulse Rate 11/01/18 1706 90     Resp 11/01/18 1706 20     Temp 11/01/18 1706 97.8 F (36.6 C)     Temp Source 11/01/18 1706 Oral     SpO2 11/01/18 1705 98 %     Weight 11/01/18 1707 139 lb 12.4 oz (63.4 kg)     Height 11/01/18 1707 5\' 10"  (1.778 m)     Head Circumference --      Peak Flow --      Pain Score 11/01/18 1706 0     Pain Loc --      Pain Edu? --      Excl. in [place]? --     Constitutional: Alert and oriented. Well appearing and in no apparent distress. HEENT:      Head: Normocephalic and atraumatic.         Eyes: Conjunctivae are normal. Sclera is non-icteric.  Mouth/Throat: Mucous membranes are moist.       Neck: Supple with no signs of meningismus. Cardiovascular: Regular rate and rhythm. No murmurs, gallops, or rubs. 2+ symmetrical  distal pulses are present in all extremities. No JVD. Respiratory: Normal respiratory effort, diminished breath sounds bilaterally with faint wheezes. Gastrointestinal: Soft, non tender, and non distended with positive bowel sounds. No rebound or guarding. Musculoskeletal: Nontender with normal range of motion in all extremities. No edema, cyanosis, or erythema of extremities. Neurologic: Normal speech and language. Face is symmetric. Moving all extremities. No gross focal neurologic deficits are appreciated. Skin: Skin is warm, dry and intact. No rash noted. Psychiatric: Mood and affect are normal. Speech and behavior are normal.  ____________________________________________   LABS (all labs ordered are listed, but only abnormal results are displayed)  Labs Reviewed  BASIC METABOLIC PANEL - Abnormal; Notable for the following components:      Result Value   Glucose, Bld 108 (*)    All other components within normal limits  CBC  TROPONIN I  BRAIN NATRIURETIC PEPTIDE  TROPONIN I   ____________________________________________  EKG  ED ECG REPORT I, Rudene Re, the attending physician, personally viewed and interpreted this ECG.  Normal sinus rhythm, rate of 93, normal intervals, normal axis, no ST elevations or depressions, T-wave inversions in 1 and aVL.unchanged from prior.  ____________________________________________  RADIOLOGY  I have personally reviewed the images performed during this visit and I agree with the Radiologist's read.   Interpretation by Radiologist:  Dg Chest 2 View  Result Date: 11/01/2018 CLINICAL DATA:  67 year old male with respiratory distress. Recent hospitalization for COPD exacerbation EXAM: CHEST - 2 VIEW COMPARISON:  Prior chest x-ray 10/23/2018 FINDINGS: Stable cardiac and mediastinal contours. Atherosclerotic calcifications evident in the transverse aorta. The lungs are hyperinflated with chronic bronchitic changes and underlying  pulmonary emphysema consistent with the clinical history of COPD. No suspicious pulmonary mass or nodule. Right upper lung scarring again noted. No acute osseous abnormality. No pleural effusion or pneumothorax. IMPRESSION: Stable chest x-ray without evidence of acute cardiopulmonary process. COPD, Aortic Atherosclerosis (ICD10-170.0) Electronically Signed   By: Jacqulynn Cadet M.D.   On: 11/01/2018 18:11     ____________________________________________   PROCEDURES  Procedure(s) performed: None Procedures Critical Care performed:  None ____________________________________________   INITIAL IMPRESSION / ASSESSMENT AND PLAN / ED COURSE   67 y.o. male with a history of CAD status post several stents, COPD on 2 L nasal cannula, smoking, HTN, HLD, CHF with EF 40-45% who presents for evaluation of shortness of breath.presentation concerning for COPD exacerbation in the setting of continues smoking. Patient is euvolemic on exam. Chest x-ray with no evidence of pulmonary edema. No evidence of pneumonia. Patient received Solu-Medrol per EMS. We'll give duonebs and doxycycline for bronchitis. show no leukocytosis, no anemia, normal electrolytes, negative troponin. EKG with no evidence of dysrhythmia or ischemia.    _________________________ 8:31 PM on 11/01/2018 -----------------------------------------  troponin 2 is negative. Patient remains extremely well appearing with a normal work of breathing and no hypoxia. He is going to be discharged home with doxycycline, albuterol and prednisone. Discussed close follow-up with primary care doctor and standard return precautions.   As part of my medical decision making, I reviewed the following data within the [place] notes reviewed and incorporated, Labs reviewed , EKG interpreted , Old EKG reviewed, Old chart reviewed, Radiograph reviewed , Notes from prior ED visits and [place] Controlled Substance Database    Pertinent  labs & imaging results that were available during my care of the patient were reviewed by me and considered in my medical decision making (see chart for details).    ____________________________________________   FINAL CLINICAL IMPRESSION(S) / ED DIAGNOSES  Final diagnoses:  Bronchitis      NEW MEDICATIONS STARTED DURING THIS VISIT:  ED Discharge Orders         Ordered    doxycycline (VIBRAMYCIN) 100 MG capsule  2 times daily     11/01/18 2013    albuterol (PROVENTIL HFA;VENTOLIN HFA) 108 (90 Base) MCG/ACT inhaler  Every 6 hours PRN     11/01/18 2013    predniSONE (DELTASONE) 20 MG tablet  Daily     11/01/18 2013           Note:  This document was prepared using Dragon voice recognition software and may include unintentional dictation errors.    Rudene Re, MD 11/01/18 2032

## 2018-11-02 ENCOUNTER — Inpatient Hospital Stay: Payer: Medicare Other | Admitting: Oncology

## 2018-11-04 NOTE — Progress Notes (Signed)
[place]  [phone]) [phone] [phone]) [phone]  ID: Milos Milligan [place] OB: Dec 28, 1950  MR#: [id number]  AOZ#:[id number]  Patient Care Team: [name], MD as PCP - General (Family Medicine) [name], MD (Family Medicine) Wellington Hampshire, MD as Consulting Physician (Cardiology) Telford Nab, RN as Registered Nurse  CHIEF COMPLAINT: Bilateral pulmonary nodules.  INTERVAL HISTORY: Patient returns to clinic today for further evaluation and discussion of his imaging results.  He continues to be highly anxious and has initiated new medication from his primary care.  He has chronic shortness of breath and requires oxygen, yet still smokes 1 pack/day. He has no neurologic complaints.  He has a good appetite and denies weight loss.  He denies any recent fevers. He denies any chest pain, cough, or hemoptysis.  He has no nausea, vomiting, constipation, or diarrhea.  No urinary complaints.  Patient offers no further specific complaints today.  REVIEW OF SYSTEMS:   Review of Systems  Constitutional: Negative.  Negative for fever, malaise/fatigue and weight loss.  Respiratory: Positive for shortness of breath. Negative for cough and hemoptysis.   Cardiovascular: Negative.  Negative for chest pain and leg swelling.  Gastrointestinal: Negative.  Negative for abdominal pain, blood in stool and melena.  Genitourinary: Negative.   Musculoskeletal: Negative.  Negative for joint pain.  Skin: Negative.  Negative for rash.  Neurological: Negative.  Negative for sensory change and weakness.  Psychiatric/Behavioral: The patient is nervous/anxious.     As per HPI. Otherwise, a complete review of systems is negative.  PAST MEDICAL HISTORY: Past Medical History:  Diagnosis Date  . Allergic rhinitis   . Anxiety   . Back pain   . Colon polyps 08/2012   colonoscopy; multiple colon polyps; repeat colonoscopy in one year.  [name] COPD (chronic obstructive pulmonary  disease) ([place])   . Coronary artery disease 11/2009   a. late presenting ant MI; b. LHC 100% pLAD s/p PCI/DES, 99% mRCA s/p PCI/DES, EF 35%; c. nuclear stress test 05/13: prior ant/inf infarcts w/o ischemia, EF 43%; d. LHC 02/14: widely patent stents with no other obs dz, EF 40%; e. LHC 11/17: LM nl, mLAD 10%, patent LAD stent, dLAD 20%, p-mRCA 10%, mRCA 40%, patent RCA stent, EF 35-45%   . Depression   . GERD (gastroesophageal reflux disease)   . Headache(784.0)   . Helicobacter pylori (H. pylori)   . Hemorrhoids   . Hernia    inguinal  . Hyperlipidemia   . Hypertension   . Insomnia   . Ischemic cardiomyopathy   . Myalgia   . Peptic ulcer   . Pulmonary nodules   . ST elevation (STEMI) myocardial infarction involving left anterior descending coronary artery ([place]) 11/2009   a. s/p PCI to the LAD  . Status post dilation of esophageal narrowing 2000  . Tinea pedis   . Wears dentures    partial upper    PAST SURGICAL HISTORY: Past Surgical History:  Procedure Laterality Date  . Admission  12/20/2012   COPD exacerbation.  [place].  [name] CARDIAC CATHETERIZATION    . CARDIAC CATHETERIZATION  02/05/13   [facility]  . CARDIAC CATHETERIZATION  10/14   [facility] : patent stents with no change in anatomy. EF: 40$  . CARDIAC CATHETERIZATION Left 11/12/2016   Procedure: Left Heart Cath and Coronary Angiography;  Surgeon: Wellington Hampshire, MD;  Location: [facility];  Service: Cardiovascular;  Laterality: Left;  . COLONOSCOPY    . COLONOSCOPY WITH PROPOFOL N/A  05/18/2016   Procedure: COLONOSCOPY WITH PROPOFOL;  Surgeon: Lucilla Lame, MD;  Location: [facility] ENDOSCOPY;  Service: Endoscopy;  Laterality: N/A;  . CORONARY ANGIOPLASTY WITH STENT PLACEMENT  09/2009   LAD 3.0 X23 mm Xience DES, RCA: 4.0 X 15 mm Xience DES  . ELECTROMAGNETIC NAVIGATION BROCHOSCOPY N/A 10/27/2017   Procedure: ELECTROMAGNETIC NAVIGATION BRONCHOSCOPY;  Surgeon: Flora Lipps, MD;  Location: [facility] ORS;  Service: Cardiopulmonary;   Laterality: N/A;  . ESOPHAGEAL DILATION    . ESOPHAGOGASTRODUODENOSCOPY  2008  . ESOPHAGOGASTRODUODENOSCOPY  08/20/2012  . HERNIA REPAIR  07/20/2012   L inguinal hernia repair  . PENILE PROSTHESIS IMPLANT      FAMILY HISTORY: Family History  Problem Relation Age of Onset  . Heart attack Brother        Brother #1  . Diabetes Brother   . Hypertension Brother        #3  . Coronary artery disease Father 60       deceased  . Heart attack Father   . Diabetes Father   . Heart disease Father   . COPD Mother 27       deceased  . Alcohol abuse Sister        polysubstance abuse  . COPD Sister   . Lung cancer Sister   . Alcohol abuse Sister        polysubstance abuse  . Penile cancer Brother   . Diabetes Brother     ADVANCED DIRECTIVES (Y/N):  N  HEALTH MAINTENANCE: Social History   Tobacco Use  . Smoking status: Current Every Day Smoker    Packs/day: 1.00    Years: 42.00    Pack years: 42.00    Types: Cigarettes  . Smokeless tobacco: Never Used  Substance Use Topics  . Alcohol use: No    Alcohol/week: 0.0 standard drinks  . Drug use: No     Colonoscopy:  PAP:  Bone density:  Lipid panel:  Allergies  Allergen Reactions  . Prozac [Fluoxetine Hcl] Shortness Of Breath  . Effexor Xr [Venlafaxine Hcl Er] Other (See Comments)    "Makes me feel funny"  . Wellbutrin [Bupropion] Other (See Comments)    "Makes me feel funny"    Current Outpatient Medications  Medication Sig Dispense Refill  . albuterol (PROVENTIL HFA;VENTOLIN HFA) 108 (90 Base) MCG/ACT inhaler Inhale 2 puffs into the lungs every 6 (six) hours as needed for wheezing or shortness of breath. 1 Inhaler 2  . ARIPiprazole (ABILIFY) 5 MG tablet Take 5 mg by mouth daily.  0  . aspirin 81 MG tablet Take 81 mg by mouth daily.      [name] atorvastatin (LIPITOR) 40 MG tablet Take 1 tablet (40 mg total) by mouth daily. 90 tablet 3  . DULoxetine (CYMBALTA) 30 MG capsule duloxetine 30 mg capsule,delayed release    .  feeding supplement, ENSURE ENLIVE, (ENSURE ENLIVE) LIQD Take 237 mLs by mouth 3 (three) times daily between meals. 237 mL 12  . Fluticasone-Salmeterol (ADVAIR DISKUS) 500-50 MCG/DOSE AEPB Inhale 1 puff into the lungs 2 (two) times daily. 180 each 3  . furosemide (LASIX) 20 MG tablet Take 1 tablet (20 mg total) by mouth daily. 90 tablet 3  . hydrOXYzine (VISTARIL) 25 MG capsule Take 25 mg by mouth 2 (two) times daily as needed.  0  . LORazepam (ATIVAN) 1 MG tablet Take 1 tablet (1 mg total) by mouth every 8 (eight) hours as needed for anxiety. 90 tablet 5  . losartan (COZAAR) 25 MG  tablet TAKE 1 TABLET DAILY 90 tablet 3  . mirtazapine (REMERON) 30 MG tablet Take 1 tablet (30 mg total) by mouth at bedtime. 30 tablet 5  . Multiple Vitamin (MULTIVITAMIN) capsule Take 1 capsule by mouth daily.    . QUEtiapine (SEROQUEL) 25 MG tablet Take 25 mg by mouth at bedtime.     . sertraline (ZOLOFT) 100 MG tablet Take 100 mg by mouth daily.   2  . SPIRIVA HANDIHALER 18 MCG inhalation capsule PLACE 1 CAPSULE INTO INHALER AND INHALE THE CONTENTS DAILY 90 capsule 3  . sucralfate (CARAFATE) 1 GM/10ML suspension Take 10 mLs (1 g total) by mouth 4 (four) times daily -  with meals and at bedtime. (Patient taking differently: Take 1 g by mouth 4 (four) times daily as needed. ) 3600 mL 3  . acetaminophen (TYLENOL) 325 MG tablet Take 2 tablets (650 mg total) by mouth every 6 (six) hours as needed for mild pain (or Fever >/= 101). (Patient not taking: Reported on 11/10/2018)    . Aclidinium Bromide (TUDORZA PRESSAIR) 400 MCG/ACT AEPB Inhale 1 puff into the lungs 2 (two) times daily. 1 each 0  . ipratropium-albuterol (DUONEB) 0.5-2.5 (3) MG/3ML SOLN Take 3 mLs by nebulization every 4 (four) hours as needed. DX:J44.9 1620 mL 3  . nitroGLYCERIN (NITROSTAT) 0.4 MG SL tablet Place 1 tablet (0.4 mg total) under the tongue every 5 (five) minutes as needed. (Patient not taking: Reported on 11/10/2018) 25 tablet 3  . predniSONE  (STERAPRED UNI-PAK 21 TAB) 10 MG (21) TBPK tablet Per packaging instructions 21 tablet 0   No current facility-administered medications for this visit.     OBJECTIVE: Vitals:   11/10/18 1101  BP: (!) 121/57  Pulse: (!) 102  Resp: 20  Temp: (!) 96.5 F (35.8 C)     Body mass index is 20.43 kg/m.    ECOG FS:0 - Asymptomatic  General: Well-developed, well-nourished, no acute distress. Eyes: Pink conjunctiva, anicteric sclera. HEENT: Normocephalic, moist mucous membranes. Lungs: Diminished breath sounds bilaterally. Heart: Regular rate and rhythm. No rubs, murmurs, or gallops. Abdomen: Soft, nontender, nondistended. No organomegaly noted, normoactive bowel sounds. Musculoskeletal: No edema, cyanosis, or clubbing. Neuro: Alert, answering all questions appropriately. Cranial nerves grossly intact. Skin: No rashes or petechiae noted. Psych: Normal affect.  LAB RESULTS:  Lab Results  Component Value Date   NA 142 11/15/2018   K 3.4 (L) 11/15/2018   CL 99 11/15/2018   CO2 36 (H) 11/15/2018   GLUCOSE 106 (H) 11/15/2018   BUN 9 11/15/2018   CREATININE 0.70 11/15/2018   CALCIUM 8.7 (L) 11/15/2018   PROT 7.1 10/18/2018   ALBUMIN 3.9 10/18/2018   AST 16 10/18/2018   ALT 18 10/18/2018   ALKPHOS 68 10/18/2018   BILITOT 0.5 10/18/2018   GFRNONAA >60 11/15/2018   GFRAA >60 11/15/2018    Lab Results  Component Value Date   WBC 7.1 11/15/2018   NEUTROABS 4.6 11/15/2018   HGB 12.8 (L) 11/15/2018   HCT 40.5 11/15/2018   MCV 93.3 11/15/2018   PLT 213 11/15/2018     STUDIES: Dg Chest 2 View  Result Date: 11/15/2018 CLINICAL DATA:  Shortness of breath.  History of COPD. EXAM: CHEST - 2 VIEW COMPARISON:  Chest radiograph November 01, 2018 FINDINGS: Cardiomediastinal silhouette is normal. Mildly calcified aortic arch. Similar hyperinflation and flattened hemidiaphragms. RIGHT upper lobe scarring. No pleural effusion or focal consolidation. No pneumothorax. Mild degenerative  change of the thoracic spine. IMPRESSION: 1. No acute  cardiopulmonary process. 2.  Emphysema (ICD10-J43.9).  Aortic Atherosclerosis (ICD10-I70.0). Electronically Signed   By: Elon Alas M.D.   On: 11/15/2018 20:13   Dg Chest 2 View  Result Date: 11/01/2018 CLINICAL DATA:  67 year old male with respiratory distress. Recent hospitalization for COPD exacerbation EXAM: CHEST - 2 VIEW COMPARISON:  Prior chest x-ray 10/23/2018 FINDINGS: Stable cardiac and mediastinal contours. Atherosclerotic calcifications evident in the transverse aorta. The lungs are hyperinflated with chronic bronchitic changes and underlying pulmonary emphysema consistent with the clinical history of COPD. No suspicious pulmonary mass or nodule. Right upper lung scarring again noted. No acute osseous abnormality. No pleural effusion or pneumothorax. IMPRESSION: Stable chest x-ray without evidence of acute cardiopulmonary process. COPD, Aortic Atherosclerosis (ICD10-170.0) Electronically Signed   By: Jacqulynn Cadet M.D.   On: 11/01/2018 18:11   Dg Chest 2 View  Result Date: 10/18/2018 CLINICAL DATA:  Chest pain EXAM: CHEST - 2 VIEW COMPARISON:  09/29/2018, chest CT 09/13/2018 FINDINGS: Emphysematous hyperinflation of lungs. No acute pulmonary consolidation or CHF. No effusion or pneumothorax. Mild aortic atherosclerosis at the arch without aneurysm. Normal size heart. Axillary lymph node on right, calcified in appearance. IMPRESSION: 1. COPD. 2. Aortic atherosclerosis. 3. Left upper lobe pulmonary nodule noted previously on CT and radiographs is not well visualized on current exam. Electronically Signed   By: Ashley Royalty M.D.   On: 10/18/2018 20:26   Ct Angio Chest Pe W/cm &/or Wo Cm  Result Date: 10/19/2018 CLINICAL DATA:  Constant chest pain right side 4 days with cough. Oxygen dependent. Shortness of breath. EXAM: CT ANGIOGRAPHY CHEST WITH CONTRAST TECHNIQUE: Multidetector CT imaging of the chest was performed using the  standard protocol during bolus administration of intravenous contrast. Multiplanar CT image reconstructions and MIPs were obtained to evaluate the vascular anatomy. CONTRAST:  25mL ISOVUE-370 IOPAMIDOL (ISOVUE-370) INJECTION 76% COMPARISON:  09/13/2018, 02/01/2018 and 12/07/2017 FINDINGS: Cardiovascular: Heart is normal size. There is calcified plaque over the left anterior descending and right coronary arteries. Minimal calcified plaque over the thoracic aorta. Pulmonary arterial system is well opacified without emboli. Mediastinum/Nodes: No mediastinal or hilar adenopathy. Lungs/Pleura: Lungs are adequately inflated with moderate centrilobular emphysematous disease. No focal airspace consolidation or effusion. Stable 4 mm nodule over the left apex. Stable 8 x 10 mm nodule over the left upper lobe which is actually decreased in size compared to 10/04/2017. Stable left lower lobe nodule measuring 5 mm. Stable 8 x 12 mm nodule over the right upper lobe actually decreased in size compared to 10/04/2017. New 7 mm nodule over the right upper lobe. Remainder of lungs are unchanged. Airways are normal. Upper Abdomen: No acute findings. Mild calcified plaque over the abdominal aorta. 2.6 cm left adrenal adenoma unchanged. Musculoskeletal: Minimal degenerative change of the spine. Review of the MIP images confirms the above findings. IMPRESSION: No acute cardiopulmonary disease and no evidence of pulmonary embolism. Bilateral pulmonary nodules as described which are stable or decreased in size compared to 10/04/2017. One new 7 mm nodule over the right upper lobe. Recommend follow-up CT 6 months. This recommendation follows the consensus statement: Guidelines for Management of Small Pulmonary Nodules Detected on CT Scans: A Statement from the [place] as published in Radiology 2005; 237:395-400. Online at: https://www.arnold.com/. Aortic Atherosclerosis (ICD10-I70.0) and Emphysema  (ICD10-J43.9). Atherosclerotic coronary artery disease. 2.6 cm left adrenal adenoma. Electronically Signed   By: Marin Olp M.D.   On: 10/19/2018 02:01   Dg Chest Portable 1 View  Result Date: 10/23/2018 CLINICAL DATA:  Shortness of breath and cough for 3-4 days. EXAM: PORTABLE CHEST 1 VIEW COMPARISON:  PA and lateral chest 10/18/2018 and 12/23/2017. CT chest 10/19/2018. FINDINGS: The lungs are emphysematous without consolidative process. Right upper lobe scar and a nodule in the left upper lobe are noted as seen on the prior exams. Heart size is normal. No pneumothorax or pleural effusion. No acute or focal bony abnormality. IMPRESSION: No acute disease. Emphysema. Electronically Signed   By: Inge Rise M.D.   On: 10/23/2018 13:09   US Abdomen Limited Ruq  Result Date: 10/19/2018 CLINICAL DATA:  Initial evaluation for acute right upper quadrant pain. EXAM: ULTRASOUND ABDOMEN LIMITED RIGHT UPPER QUADRANT COMPARISON:  Prior PET-CT from 10/13/2017. FINDINGS: Gallbladder: No stones or sludge seen within the gallbladder lumen. Gallbladder wall measure within normal limits at 2.8 mm in thickness. No free pericholecystic fluid. No sonographic Murphy sign elicited on exam. Common bile duct: Diameter: 10 mm Liver: 1.1 x 1.5 x 1.3 cm well-circumscribed echogenic lesion within the right hepatic lobe. Adjacent 8 x 7 x 8 mm echogenic focus noted within the right hepatic lobe. Findings are indeterminate, but most characteristic for small hemangiomas. 1.3 x 1.4 x 1.1 cm simple cyst noted near the porta hepatis. Within normal limits in parenchymal echogenicity. Portal vein is patent on color Doppler imaging with normal direction of blood flow towards the liver. 2.8 cm lesion seen arising from the right adrenal gland, likely a small adenoma as seen on prior PET-CT. IMPRESSION: 1. No sonographic evidence for cholelithiasis or acute cholecystitis. 2. Mild dilatation of the common bile duct up to 10 mm, of uncertain  significance. Correlation with laboratory values suggested. No obstructive choledocholithiasis identified by sonography. Further assessment with dedicated MRI/MRCP could be performed for further evaluation as clinically warranted. 3. Few small echogenic lesions within the right hepatic lobe measuring up to 1.5 cm, nonspecific, but most likely small hemangiomas. 4. 1.4 cm simple hepatic cyst near the porta hepatis. Electronically Signed   By: Jeannine Boga M.D.   On: 10/19/2018 00:14    ASSESSMENT: Bilateral pulmonary nodule.  PLAN:    1. Bilateral pulmonary nodule: Previously, patient underwent biopsies of both lesions on October 27, 2017 that were negative for malignancies.  CT scan results from October 19, 2018 reviewed independently and report as above with stable bilateral lesions, but patient has a new 7 mm nodule in the right upper lobe.  No intervention is needed at this time.  Return to clinic in 6 months with repeat imaging and further evaluation.  2.  Smoking cessation: Patient continues to smoke 1 pack/day. 3.  Anxiety: Continue treatment and evaluation per primary care. 4.  Shortness of breath: Continue oxygen as needed.  Patient expressed understanding and was in agreement with this plan. He also understands that He can call clinic at any time with any questions, concerns, or complaints.   Cancer Staging No matching staging information was found for the patient.  Lloyd Huger, MD   11/17/2018 11:03 AM

## 2018-11-06 ENCOUNTER — Inpatient Hospital Stay: Payer: Medicare Other | Admitting: Oncology

## 2018-11-10 ENCOUNTER — Other Ambulatory Visit: Payer: Self-pay

## 2018-11-10 ENCOUNTER — Other Ambulatory Visit: Payer: Self-pay | Admitting: Pulmonary Disease

## 2018-11-10 ENCOUNTER — Telehealth: Payer: Self-pay | Admitting: Pulmonary Disease

## 2018-11-10 ENCOUNTER — Inpatient Hospital Stay: Payer: Medicare Other | Attending: Oncology | Admitting: Oncology

## 2018-11-10 VITALS — BP 121/57 | HR 102 | Temp 96.5°F | Resp 20 | Wt 142.4 lb

## 2018-11-10 DIAGNOSIS — F419 Anxiety disorder, unspecified: Secondary | ICD-10-CM | POA: Insufficient documentation

## 2018-11-10 DIAGNOSIS — J449 Chronic obstructive pulmonary disease, unspecified: Secondary | ICD-10-CM | POA: Diagnosis not present

## 2018-11-10 DIAGNOSIS — F17210 Nicotine dependence, cigarettes, uncomplicated: Secondary | ICD-10-CM | POA: Insufficient documentation

## 2018-11-10 DIAGNOSIS — R918 Other nonspecific abnormal finding of lung field: Secondary | ICD-10-CM | POA: Diagnosis present

## 2018-11-10 DIAGNOSIS — Z9981 Dependence on supplemental oxygen: Secondary | ICD-10-CM | POA: Diagnosis not present

## 2018-11-10 DIAGNOSIS — R0602 Shortness of breath: Secondary | ICD-10-CM | POA: Insufficient documentation

## 2018-11-10 DIAGNOSIS — Z801 Family history of malignant neoplasm of trachea, bronchus and lung: Secondary | ICD-10-CM | POA: Diagnosis not present

## 2018-11-10 DIAGNOSIS — I10 Essential (primary) hypertension: Secondary | ICD-10-CM | POA: Diagnosis not present

## 2018-11-10 MED ORDER — IPRATROPIUM-ALBUTEROL 0.5-2.5 (3) MG/3ML IN SOLN: 3.0000 mL | RESPIRATORY_TRACT | 3 refills | Status: DC | PRN

## 2018-11-10 NOTE — Telephone Encounter (Signed)
Patient calling the office for samples of medication:   1.  What medication and dosage are you requesting samples for? Spirva 18 MCG   2.  Are you currently out of this medication? 5 or 6 left - insurance has not kicked in yet so it is not paying for it.  Would like to know if there are any samples or any other options available.  Please call to discuss ASAP.

## 2018-11-10 NOTE — Telephone Encounter (Signed)
Pt does not have insurance until 1st of the year. Pt has Albuterol rescue inhaler and Duoneb q 4 hrs prn. Pt can't afford Spiriva Resp and wants to know what he should do after he finisihes last 6 doses. Please advise.

## 2018-11-10 NOTE — Progress Notes (Signed)
Here for follow up. On )2 2 L prn per pt  Stated smoking 1 ppd still. No pain c/o. No SOB he stated  Pulse ox 98 5 on @ L o2- per pt at home off 02 is 92 % on RA    Stated new anxiety meds -see mar

## 2018-11-12 DIAGNOSIS — F331 Major depressive disorder, recurrent, moderate: Secondary | ICD-10-CM | POA: Insufficient documentation

## 2018-11-12 DIAGNOSIS — F411 Generalized anxiety disorder: Secondary | ICD-10-CM | POA: Insufficient documentation

## 2018-11-14 MED ORDER — ACLIDINIUM BROMIDE 400 MCG/ACT IN AEPB: 1.0000 | INHALATION_SPRAY | Freq: Two times a day (BID) | RESPIRATORY_TRACT | 0 refills | Status: DC

## 2018-11-14 NOTE — Telephone Encounter (Signed)
Dr. [name] would like to give patient samples of Tudorza. Pt aware.

## 2018-11-14 NOTE — Telephone Encounter (Signed)
Provide samples of Tudorza to get him through next month. It is to be used one inhalation twice daily

## 2018-11-14 NOTE — Addendum Note (Signed)
Addended by: Stephanie Coup on: 11/14/2018 01:49 PM   Modules accepted: Orders

## 2018-11-15 ENCOUNTER — Emergency Department: Payer: Medicare Other

## 2018-11-15 ENCOUNTER — Other Ambulatory Visit: Payer: Self-pay

## 2018-11-15 ENCOUNTER — Emergency Department
Admission: EM | Admit: 2018-11-15 | Discharge: 2018-11-15 | Disposition: A | Discharge status: Home / Self Care | Payer: Medicare Other | Attending: Emergency Medicine | Admitting: Emergency Medicine

## 2018-11-15 DIAGNOSIS — J441 Chronic obstructive pulmonary disease with (acute) exacerbation: Secondary | ICD-10-CM | POA: Insufficient documentation

## 2018-11-15 DIAGNOSIS — Z7982 Long term (current) use of aspirin: Secondary | ICD-10-CM | POA: Insufficient documentation

## 2018-11-15 DIAGNOSIS — I2510 Atherosclerotic heart disease of native coronary artery without angina pectoris: Secondary | ICD-10-CM | POA: Diagnosis not present

## 2018-11-15 DIAGNOSIS — Z955 Presence of coronary angioplasty implant and graft: Secondary | ICD-10-CM | POA: Insufficient documentation

## 2018-11-15 DIAGNOSIS — Z79899 Other long term (current) drug therapy: Secondary | ICD-10-CM | POA: Insufficient documentation

## 2018-11-15 DIAGNOSIS — F17210 Nicotine dependence, cigarettes, uncomplicated: Secondary | ICD-10-CM | POA: Diagnosis not present

## 2018-11-15 DIAGNOSIS — I10 Essential (primary) hypertension: Secondary | ICD-10-CM | POA: Insufficient documentation

## 2018-11-15 DIAGNOSIS — F329 Major depressive disorder, single episode, unspecified: Secondary | ICD-10-CM | POA: Insufficient documentation

## 2018-11-15 DIAGNOSIS — R0602 Shortness of breath: Secondary | ICD-10-CM | POA: Diagnosis present

## 2018-11-15 DIAGNOSIS — F419 Anxiety disorder, unspecified: Secondary | ICD-10-CM | POA: Diagnosis not present

## 2018-11-15 LAB — BASIC METABOLIC PANEL
Anion gap: 7 (ref 5–15)
BUN: 9 mg/dL (ref 8–23)
CO2: 36 mmol/L — ABNORMAL HIGH (ref 22–32)
CREATININE: 0.7 mg/dL (ref 0.61–1.24)
Calcium: 8.7 mg/dL — ABNORMAL LOW (ref 8.9–10.3)
Chloride: 99 mmol/L (ref 98–111)
Glucose, Bld: 106 mg/dL — ABNORMAL HIGH (ref 70–99)
POTASSIUM: 3.4 mmol/L — AB (ref 3.5–5.1)
SODIUM: 142 mmol/L (ref 135–145)

## 2018-11-15 LAB — CBC WITH DIFFERENTIAL/PLATELET
ABS IMMATURE GRANULOCYTES: 0.02 10*3/uL (ref 0.00–0.07)
BASOS ABS: 0 10*3/uL (ref 0.0–0.1)
BASOS PCT: 0 %
Eosinophils Absolute: 0.2 10*3/uL (ref 0.0–0.5)
Eosinophils Relative: 2 %
HCT: 40.5 % (ref 39.0–52.0)
HEMOGLOBIN: 12.8 g/dL — AB (ref 13.0–17.0)
Immature Granulocytes: 0 %
LYMPHS PCT: 22 %
Lymphs Abs: 1.6 10*3/uL (ref 0.7–4.0)
MCH: 29.5 pg (ref 26.0–34.0)
MCHC: 31.6 g/dL (ref 30.0–36.0)
MCV: 93.3 fL (ref 80.0–100.0)
MONO ABS: 0.8 10*3/uL (ref 0.1–1.0)
Monocytes Relative: 11 %
NEUTROS ABS: 4.6 10*3/uL (ref 1.7–7.7)
NRBC: 0 % (ref 0.0–0.2)
Neutrophils Relative %: 65 %
PLATELETS: 213 10*3/uL (ref 150–400)
RBC: 4.34 MIL/uL (ref 4.22–5.81)
RDW: 12.3 % (ref 11.5–15.5)
WBC: 7.1 10*3/uL (ref 4.0–10.5)

## 2018-11-15 LAB — TROPONIN I

## 2018-11-15 IMAGING — CR DG CHEST 2V
2 series · 2 of 2 positions shown · non-contrast
Comparison: Chest radiograph [DATE]

CLINICAL DATA: Shortness of breath.  History of COPD.

EXAM:
CHEST - 2 VIEW

[chest pa]
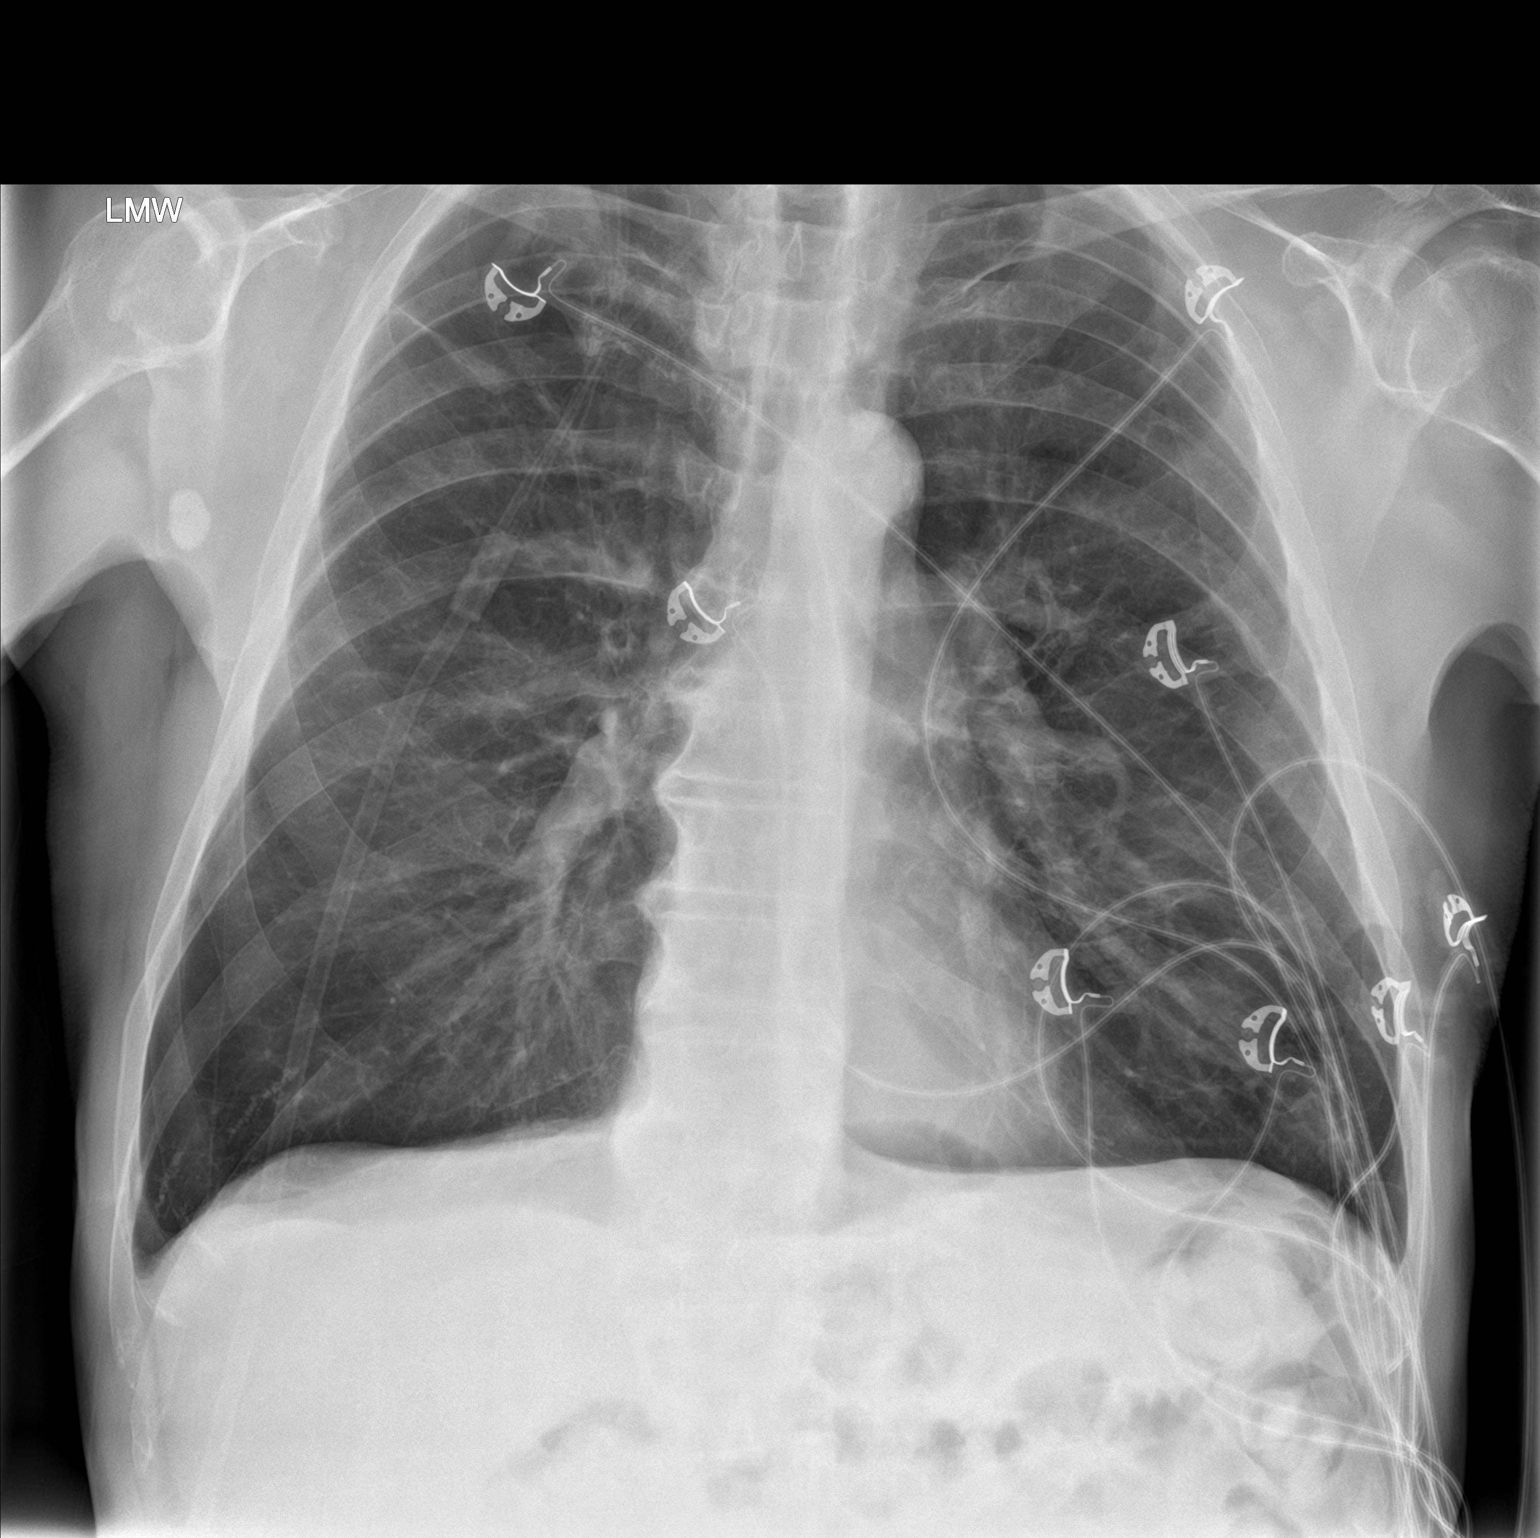

[chest lat]
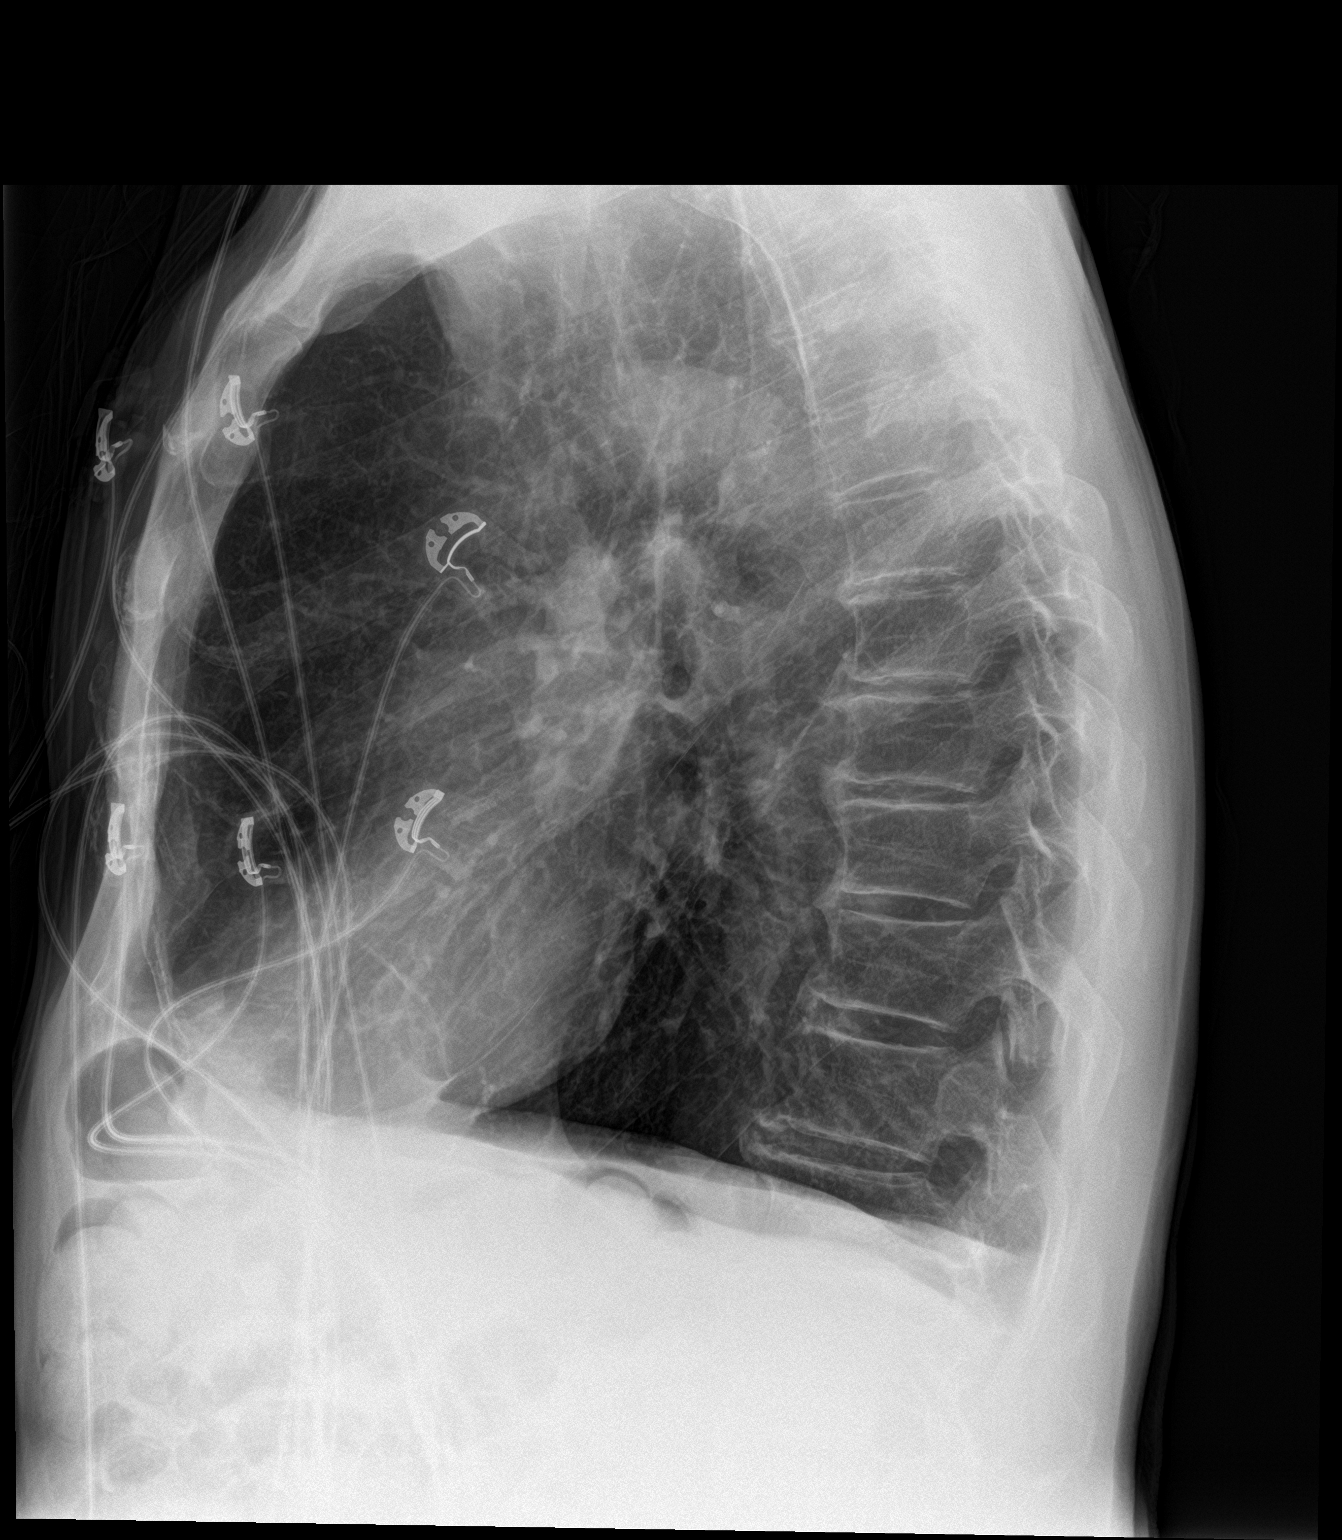

[2 of 2 positions shown; findings below may reference images not displayed]

FINDINGS: Cardiomediastinal silhouette is normal. Mildly calcified aortic
arch. Similar hyperinflation and flattened hemidiaphragms.. RIGHT
upper lobe scarring. No pleural effusion or focal consolidation. No
pneumothorax. Mild degenerative change of the thoracic spine.
IMPRESSION: 1. No acute cardiopulmonary process.
2.  Emphysema ([46]-[46]).  Aortic Atherosclerosis ([46]-[46]).

## 2018-11-15 MED ORDER — PREDNISONE 10 MG (21) PO TBPK: ORAL_TABLET | ORAL | 0 refills | Status: DC

## 2018-11-15 MED ORDER — IPRATROPIUM-ALBUTEROL 0.5-2.5 (3) MG/3ML IN SOLN
3.0000 mL | Freq: Once | RESPIRATORY_TRACT | Status: AC
  Administered 2018-11-15: 3 mL via RESPIRATORY_TRACT
  Filled 2018-11-15: qty 3

## 2018-11-15 MED ORDER — METHYLPREDNISOLONE SODIUM SUCC 125 MG IJ SOLR
125.0000 mg | Freq: Once | INTRAMUSCULAR | Status: AC
  Administered 2018-11-15: 125 mg via INTRAVENOUS
  Filled 2018-11-15: qty 2

## 2018-11-15 NOTE — ED Provider Notes (Signed)
[facility] Emergency Department Provider Note  ____________________________________________   I have reviewed the triage vital signs and the nursing notes.   HISTORY  Chief Complaint Shortness of Breath   History limited by: Not Limited   HPI Leslie Sims is a 67 y.o. male who presents to the emergency department today because of concern for shortness of breath. He does have a history of COPD. States that his breathing started getting worse 4 days ago. It started when the patient opened a can of hand cream and smelled an odor. He has been trying his home breathing treatments without any significant relief. The patient denies any chest pain. Denies any fevers. Has had some associated cough with a small amount of phlegm production.   Per medical record review patient has a history of COPD, frequent admissions  Past Medical History:  Diagnosis Date  . Allergic rhinitis   . Anxiety   . Back pain   . Colon polyps 08/2012   colonoscopy; multiple colon polyps; repeat colonoscopy in one year.  [name] COPD (chronic obstructive pulmonary disease) ([place])   . Coronary artery disease 11/2009   a. late presenting ant MI; b. LHC 100% pLAD s/p PCI/DES, 99% mRCA s/p PCI/DES, EF 35%; c. nuclear stress test 05/13: prior ant/inf infarcts w/o ischemia, EF 43%; d. LHC 02/14: widely patent stents with no other obs dz, EF 40%; e. LHC 11/17: LM nl, mLAD 10%, patent LAD stent, dLAD 20%, p-mRCA 10%, mRCA 40%, patent RCA stent, EF 35-45%   . Depression   . GERD (gastroesophageal reflux disease)   . Headache(784.0)   . Helicobacter pylori (H. pylori)   . Hemorrhoids   . Hernia    inguinal  . Hyperlipidemia   . Hypertension   . Insomnia   . Ischemic cardiomyopathy   . Myalgia   . Peptic ulcer   . Pulmonary nodules   . ST elevation (STEMI) myocardial infarction involving left anterior descending coronary artery ([place]) 11/2009   a. s/p PCI to the LAD  . Status post dilation of  esophageal narrowing 2000  . Tinea pedis   . Wears dentures    partial upper    Patient Active Problem List   Diagnosis Date Noted  . Acute respiratory failure with hypoxia ([place]) 09/29/2018  . Acute respiratory failure with hypoxia and hypercapnia ([place]) 09/13/2018  . Acute on chronic respiratory failure with hypoxia and hypercapnia ([place]) 08/24/2018  . Chest pain 07/02/2018  . Grief reaction 01/13/2018  . COPD with hypoxia ([place]) 12/25/2017  . Unstable angina ([place])   . Benign neoplasm of ascending colon   . Benign neoplasm of descending colon   . Benign neoplasm of sigmoid colon   . Personal history of colonic polyps   . Pulmonary nodules/lesions, multiple 01/05/2015  . Bruit of left carotid artery 11/04/2014  . Sleep disorder 07/18/2013  . Seasonal and perennial allergic rhinitis 05/29/2013  . Bradycardia 04/20/2011  . SMOKER 07/30/2010  . CAD, NATIVE VESSEL 04/23/2010  . Hyperlipidemia 03/24/2010  . DEPRESSION/ANXIETY 03/24/2010  . Chronic systolic heart failure ([place]) 03/24/2010  . COPD, very severe ([place]) 03/24/2010  . GERD 03/24/2010  . Essential hypertension 11/21/2009    Past Surgical History:  Procedure Laterality Date  . Admission  12/20/2012   COPD exacerbation.  [place].  [name] CARDIAC CATHETERIZATION    . CARDIAC CATHETERIZATION  02/05/13   [facility]  . CARDIAC CATHETERIZATION  10/14   [facility] : patent stents with no change in anatomy. EF: 40$  .  CARDIAC CATHETERIZATION Left 11/12/2016   Procedure: Left Heart Cath and Coronary Angiography;  Surgeon: Wellington Hampshire, MD;  Location: [facility];  Service: Cardiovascular;  Laterality: Left;  . COLONOSCOPY    . COLONOSCOPY WITH PROPOFOL N/A 05/18/2016   Procedure: COLONOSCOPY WITH PROPOFOL;  Surgeon: Lucilla Lame, MD;  Location: [facility] ENDOSCOPY;  Service: Endoscopy;  Laterality: N/A;  . CORONARY ANGIOPLASTY WITH STENT PLACEMENT  09/2009   LAD 3.0 X23 mm Xience DES, RCA: 4.0 X 15 mm Xience DES  . ELECTROMAGNETIC NAVIGATION  BROCHOSCOPY N/A 10/27/2017   Procedure: ELECTROMAGNETIC NAVIGATION BRONCHOSCOPY;  Surgeon: Flora Lipps, MD;  Location: [facility] ORS;  Service: Cardiopulmonary;  Laterality: N/A;  . ESOPHAGEAL DILATION    . ESOPHAGOGASTRODUODENOSCOPY  2008  . ESOPHAGOGASTRODUODENOSCOPY  08/20/2012  . HERNIA REPAIR  07/20/2012   L inguinal hernia repair  . PENILE PROSTHESIS IMPLANT      Prior to Admission medications   Medication Sig Start Date End Date Taking? Authorizing Provider  acetaminophen (TYLENOL) 325 MG tablet Take 2 tablets (650 mg total) by mouth every 6 (six) hours as needed for mild pain (or Fever >/= 101). Patient not taking: Reported on 11/10/2018 08/26/18   [name], MD  Aclidinium Bromide (TUDORZA PRESSAIR) 400 MCG/ACT AEPB Inhale 1 puff into the lungs 2 (two) times daily. 11/14/18   Wilhelmina Mcardle, MD  albuterol (PROVENTIL HFA;VENTOLIN HFA) 108 (90 Base) MCG/ACT inhaler Inhale 2 puffs into the lungs every 6 (six) hours as needed for wheezing or shortness of breath. 11/01/18   Rudene Re, MD  ARIPiprazole (ABILIFY) 5 MG tablet Take 5 mg by mouth daily. 07/26/18   [provider]  aspirin 81 MG tablet Take 81 mg by mouth daily.      [provider]  atorvastatin (LIPITOR) 40 MG tablet Take 1 tablet (40 mg total) by mouth daily. 03/03/18   [name], MD  DULoxetine (CYMBALTA) 30 MG capsule duloxetine 30 mg capsule,delayed release    [provider]  feeding supplement, ENSURE ENLIVE, (ENSURE ENLIVE) LIQD Take 237 mLs by mouth 3 (three) times daily between meals. 10/01/18   Epifanio Lesches, MD  Fluticasone-Salmeterol (ADVAIR DISKUS) 500-50 MCG/DOSE AEPB Inhale 1 puff into the lungs 2 (two) times daily. 07/27/17   [name], MD  furosemide (LASIX) 20 MG tablet Take 1 tablet (20 mg total) by mouth daily. 07/07/18 09/30/19  [name], MD  hydrOXYzine (VISTARIL) 25 MG capsule Take 25 mg by mouth 2 (two) times daily as needed. 06/05/18   [provider]  ipratropium-albuterol (DUONEB) 0.5-2.5 (3) MG/3ML SOLN Take 3 mLs by nebulization every 4 (four) hours as needed. DX:J44.9 11/10/18   Wilhelmina Mcardle, MD  LORazepam (ATIVAN) 1 MG tablet Take 1 tablet (1 mg total) by mouth every 8 (eight) hours as needed for anxiety. 07/07/18   [name], MD  losartan (COZAAR) 25 MG tablet TAKE 1 TABLET DAILY 01/27/18   Minna Merritts, MD  mirtazapine (REMERON) 30 MG tablet Take 1 tablet (30 mg total) by mouth at bedtime. 06/24/18   [name], MD  Multiple Vitamin (MULTIVITAMIN) capsule Take 1 capsule by mouth daily.    [provider]  nitroGLYCERIN (NITROSTAT) 0.4 MG SL tablet Place 1 tablet (0.4 mg total) under the tongue every 5 (five) minutes as needed. Patient not taking: Reported on 11/10/2018 10/17/17   Wellington Hampshire, MD  QUEtiapine (SEROQUEL) 25 MG tablet Take 25 mg by mouth at bedtime.  11/07/18 12/07/18  [provider]  sertraline (ZOLOFT) 100 MG tablet Take 100 mg by mouth daily.  06/28/18   [provider]  SPIRIVA HANDIHALER 18 MCG inhalation capsule PLACE 1 CAPSULE INTO INHALER AND INHALE THE CONTENTS DAILY 07/26/18   Trinna Post, PA-C  sucralfate (CARAFATE) 1 GM/10ML suspension Take 10 mLs (1 g total) by mouth 4 (four) times daily -  with meals and at bedtime. Patient taking differently: Take 1 g by mouth 4 (four) times daily as needed.  05/16/18   [name], MD    Allergies Prozac [fluoxetine hcl]; Effexor xr [venlafaxine hcl er]; and Wellbutrin [bupropion]  Family History  Problem Relation Age of Onset  . Heart attack Brother        Brother #1  . Diabetes Brother   . Hypertension Brother        #3  . Coronary artery disease Father 74       deceased  . Heart attack Father   . Diabetes Father   . Heart disease Father   . COPD Mother 48       deceased  . Alcohol abuse Sister        polysubstance abuse  . COPD Sister   . Lung cancer Sister   . Alcohol abuse Sister         polysubstance abuse  . Penile cancer Brother   . Diabetes Brother     Social History Social History   Tobacco Use  . Smoking status: Current Every Day Smoker    Packs/day: 1.00    Years: 42.00    Pack years: 42.00    Types: Cigarettes  . Smokeless tobacco: Never Used  Substance Use Topics  . Alcohol use: No    Alcohol/week: 0.0 standard drinks  . Drug use: No    Review of Systems Constitutional: No fever/chills Eyes: No visual changes. ENT: No sore throat. Cardiovascular: Denies chest pain. Respiratory: Positive for shortness of breath Gastrointestinal: No abdominal pain.  No nausea, no vomiting.  No diarrhea.   Genitourinary: Negative for dysuria. Musculoskeletal: Negative for back pain. Skin: Negative for rash. Neurological: Negative for headaches, focal weakness or numbness.  ____________________________________________   PHYSICAL EXAM:  VITAL SIGNS: ED Triage Vitals [11/15/18 1938]  Enc Vitals Group     BP      Pulse      Resp      Temp      Temp src      SpO2      Weight 141 lb (64 kg)     Height 5\' 10"  (1.778 m)     Head Circumference      Peak Flow      Pain Score 0   Constitutional: Alert and oriented.  Eyes: Conjunctivae are normal.  ENT      Head: Normocephalic and atraumatic.      Nose: No congestion/rhinnorhea.      Mouth/Throat: Mucous membranes are moist.      Neck: No stridor. Hematological/Lymphatic/Immunilogical: No cervical lymphadenopathy. Cardiovascular: Normal rate, regular rhythm.  No murmurs, rubs, or gallops. Respiratory: Slightly increased respiratory effort. No rhonchi noted. Slight expiratory wheeze.  Gastrointestinal: Soft and non tender. No rebound. No guarding.  Genitourinary: Deferred Musculoskeletal: Normal range of motion in all extremities. No lower extremity edema. Neurologic:  Normal speech and language. No gross focal neurologic deficits are appreciated.  Skin:  Skin is warm, dry and intact. No rash  noted. Psychiatric: Mood and affect are normal. Speech  and behavior are normal. Patient exhibits appropriate insight and judgment.  ____________________________________________    LABS (pertinent positives/negatives)  Trop <0.03 BMP na 142, k 3.4, glu 106, cr 0.70 CBC wbc 7.1, hgb 12.8, plt 213  ____________________________________________   EKG  I, Nance Pear, attending physician, personally viewed and interpreted this EKG  EKG Time: 1947 Rate: 89 Rhythm: sinus rhythm Axis: left axis deviation Intervals: qtc 497 QRS: LAFB, q waves v1, v2, v3 ST changes: no st elevation Impression: abnormal ekg   ____________________________________________    RADIOLOGY  CXR No acute process  ____________________________________________   PROCEDURES  Procedures  ____________________________________________   INITIAL IMPRESSION / ASSESSMENT AND PLAN / ED COURSE  Pertinent labs & imaging results that were available during my care of the patient were reviewed by me and considered in my medical decision making (see chart for details).   Patient presented to the emergency department today because of concerns for shortness of breath.  Differential would be broad including PE, pneumonia, pneumothorax, pleural effusion, COPD, CHF amongst other etiologies.  Patient's CXR without concerning findings.  He stated he did feel better after breathing treatments.  This point think COPD exacerbation likely.  Discussed with patient and he felt comfortable managing this at home.  We did discuss return precautions.  ____________________________________________   FINAL CLINICAL IMPRESSION(S) / ED DIAGNOSES  Final diagnoses:  COPD exacerbation ([place])     Note: This dictation was prepared with Dragon dictation. Any transcriptional errors that result from this process are unintentional     Nance Pear, MD 11/15/18 2157

## 2018-11-15 NOTE — Discharge Instructions (Addendum)
Please seek medical attention for any high fevers, chest pain, shortness of breath, change in behavior, persistent vomiting, bloody stool or any other new or concerning symptoms.  

## 2018-11-15 NOTE — ED Triage Notes (Signed)
Pt in with [facility] since Saturday does have copd is on o2 at 2 l prn.

## 2018-11-20 ENCOUNTER — Emergency Department
Admission: EM | Admit: 2018-11-20 | Discharge: 2018-11-20 | Disposition: A | Discharge status: Home / Self Care | Payer: Medicare Other | Attending: Emergency Medicine | Admitting: Emergency Medicine

## 2018-11-20 ENCOUNTER — Other Ambulatory Visit: Payer: Self-pay

## 2018-11-20 ENCOUNTER — Emergency Department: Payer: Medicare Other

## 2018-11-20 DIAGNOSIS — Z7982 Long term (current) use of aspirin: Secondary | ICD-10-CM | POA: Diagnosis not present

## 2018-11-20 DIAGNOSIS — J40 Bronchitis, not specified as acute or chronic: Secondary | ICD-10-CM

## 2018-11-20 DIAGNOSIS — Z79899 Other long term (current) drug therapy: Secondary | ICD-10-CM | POA: Diagnosis not present

## 2018-11-20 DIAGNOSIS — J209 Acute bronchitis, unspecified: Secondary | ICD-10-CM | POA: Insufficient documentation

## 2018-11-20 DIAGNOSIS — F17210 Nicotine dependence, cigarettes, uncomplicated: Secondary | ICD-10-CM | POA: Diagnosis not present

## 2018-11-20 DIAGNOSIS — I2510 Atherosclerotic heart disease of native coronary artery without angina pectoris: Secondary | ICD-10-CM | POA: Insufficient documentation

## 2018-11-20 DIAGNOSIS — I110 Hypertensive heart disease with heart failure: Secondary | ICD-10-CM | POA: Insufficient documentation

## 2018-11-20 DIAGNOSIS — I252 Old myocardial infarction: Secondary | ICD-10-CM | POA: Insufficient documentation

## 2018-11-20 DIAGNOSIS — J449 Chronic obstructive pulmonary disease, unspecified: Secondary | ICD-10-CM | POA: Insufficient documentation

## 2018-11-20 DIAGNOSIS — I5022 Chronic systolic (congestive) heart failure: Secondary | ICD-10-CM | POA: Diagnosis not present

## 2018-11-20 DIAGNOSIS — R0602 Shortness of breath: Secondary | ICD-10-CM | POA: Diagnosis present

## 2018-11-20 LAB — BASIC METABOLIC PANEL
ANION GAP: 9 (ref 5–15)
BUN: 11 mg/dL (ref 8–23)
CHLORIDE: 94 mmol/L — AB (ref 98–111)
CO2: 39 mmol/L — ABNORMAL HIGH (ref 22–32)
Calcium: 9.4 mg/dL (ref 8.9–10.3)
Creatinine, Ser: 0.69 mg/dL (ref 0.61–1.24)
GFR calc Af Amer: >60 mL/min (ref >60)
GFR calc non Af Amer: >60 mL/min (ref >60)
Glucose, Bld: 96 mg/dL (ref 70–99)
POTASSIUM: 3.6 mmol/L (ref 3.5–5.1)
SODIUM: 142 mmol/L (ref 135–145)

## 2018-11-20 LAB — CBC
HEMATOCRIT: 41.8 % (ref 39.0–52.0)
HEMOGLOBIN: 13.3 g/dL (ref 13.0–17.0)
MCH: 29.8 pg (ref 26.0–34.0)
MCHC: 31.8 g/dL (ref 30.0–36.0)
MCV: 93.7 fL (ref 80.0–100.0)
NRBC: 0 % (ref 0.0–0.2)
Platelets: 232 10*3/uL (ref 150–400)
RBC: 4.46 MIL/uL (ref 4.22–5.81)
RDW: 12.4 % (ref 11.5–15.5)
WBC: 6.6 10*3/uL (ref 4.0–10.5)

## 2018-11-20 LAB — TROPONIN I: Troponin I: <0.03 ng/mL (ref <0.03)

## 2018-11-20 IMAGING — CR DG CHEST 2V
2 series · 2 of 2 positions shown · non-contrast
Comparison: [DATE]

CLINICAL DATA: Chest discomfort for 1 week

EXAM:
CHEST - 2 VIEW

[chest pa]
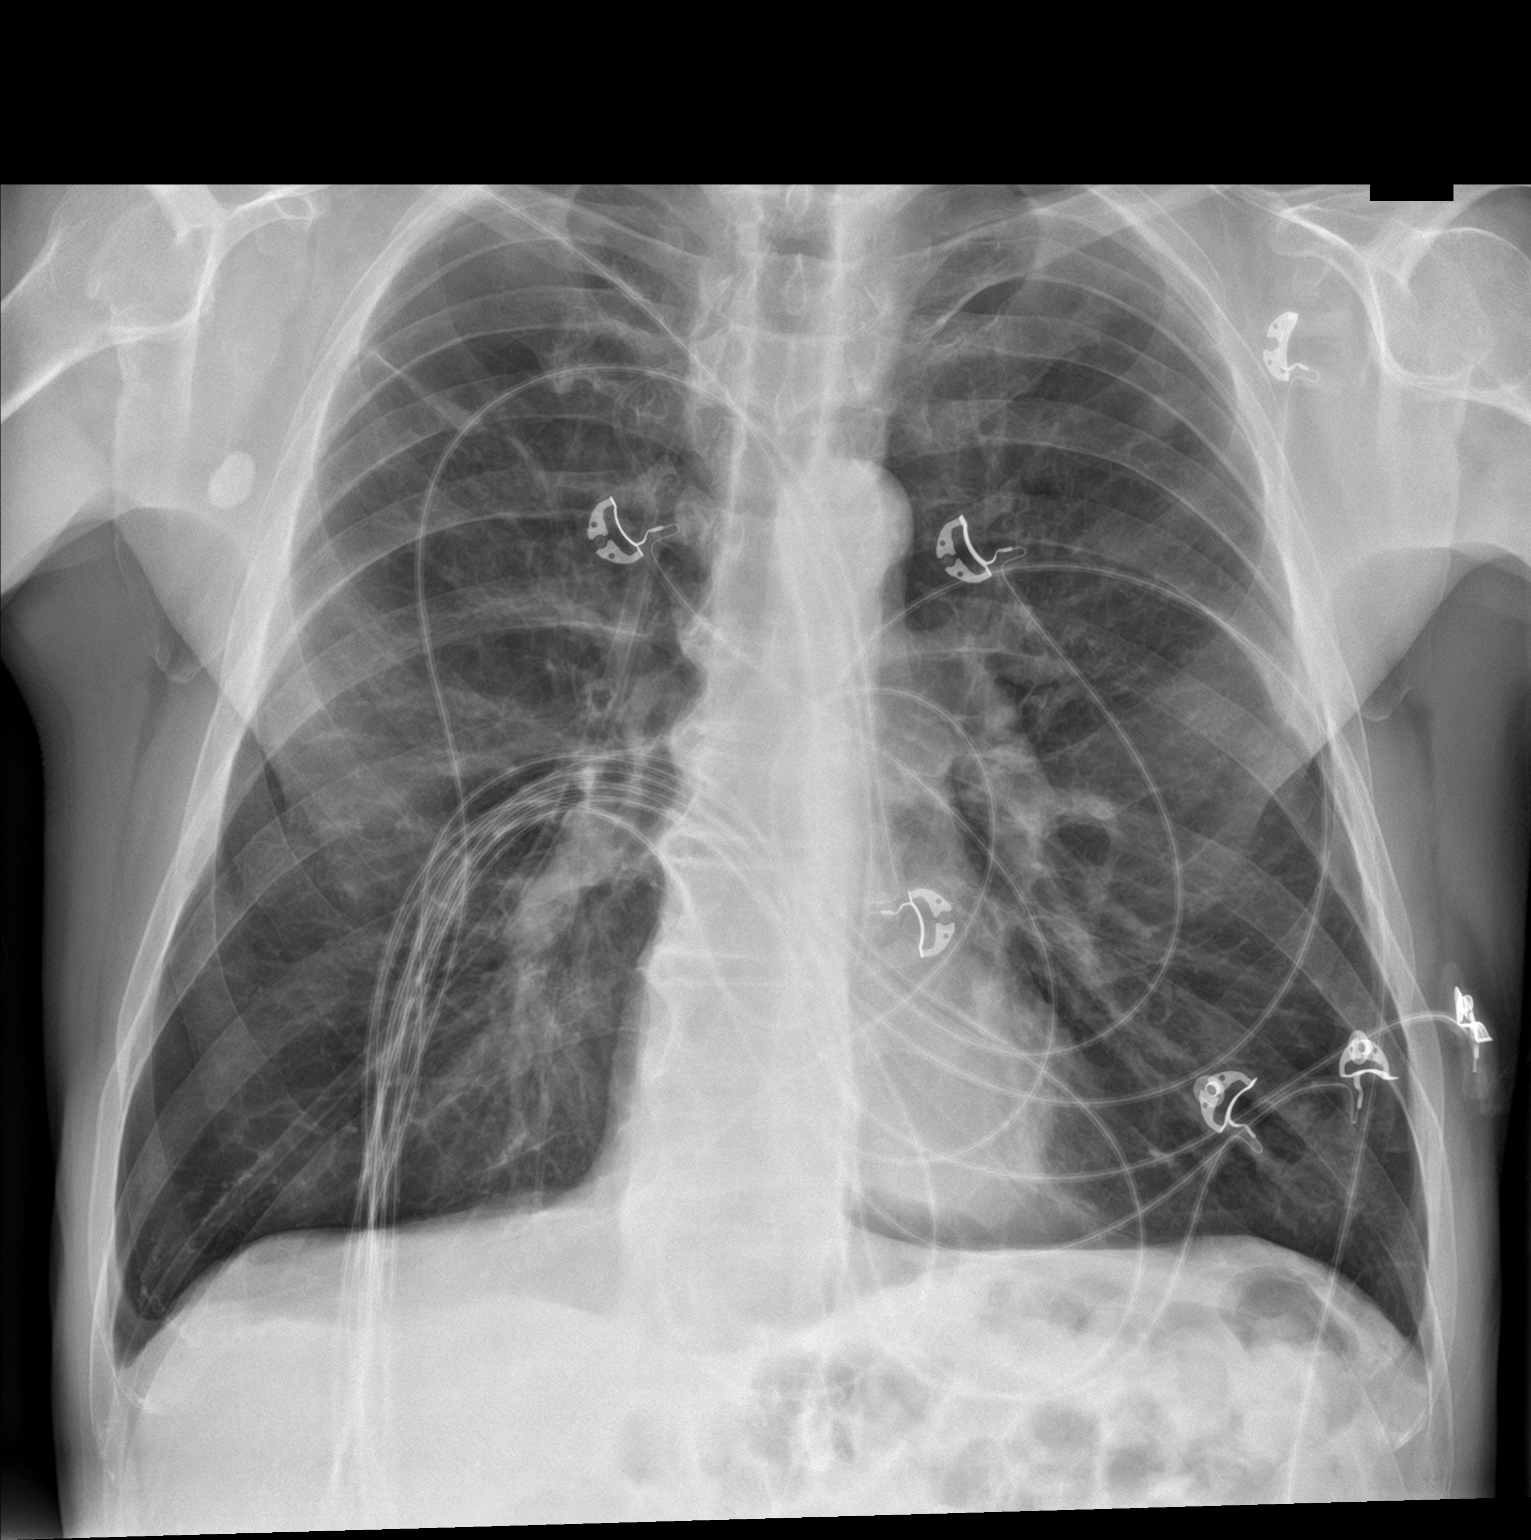

[chest lat]
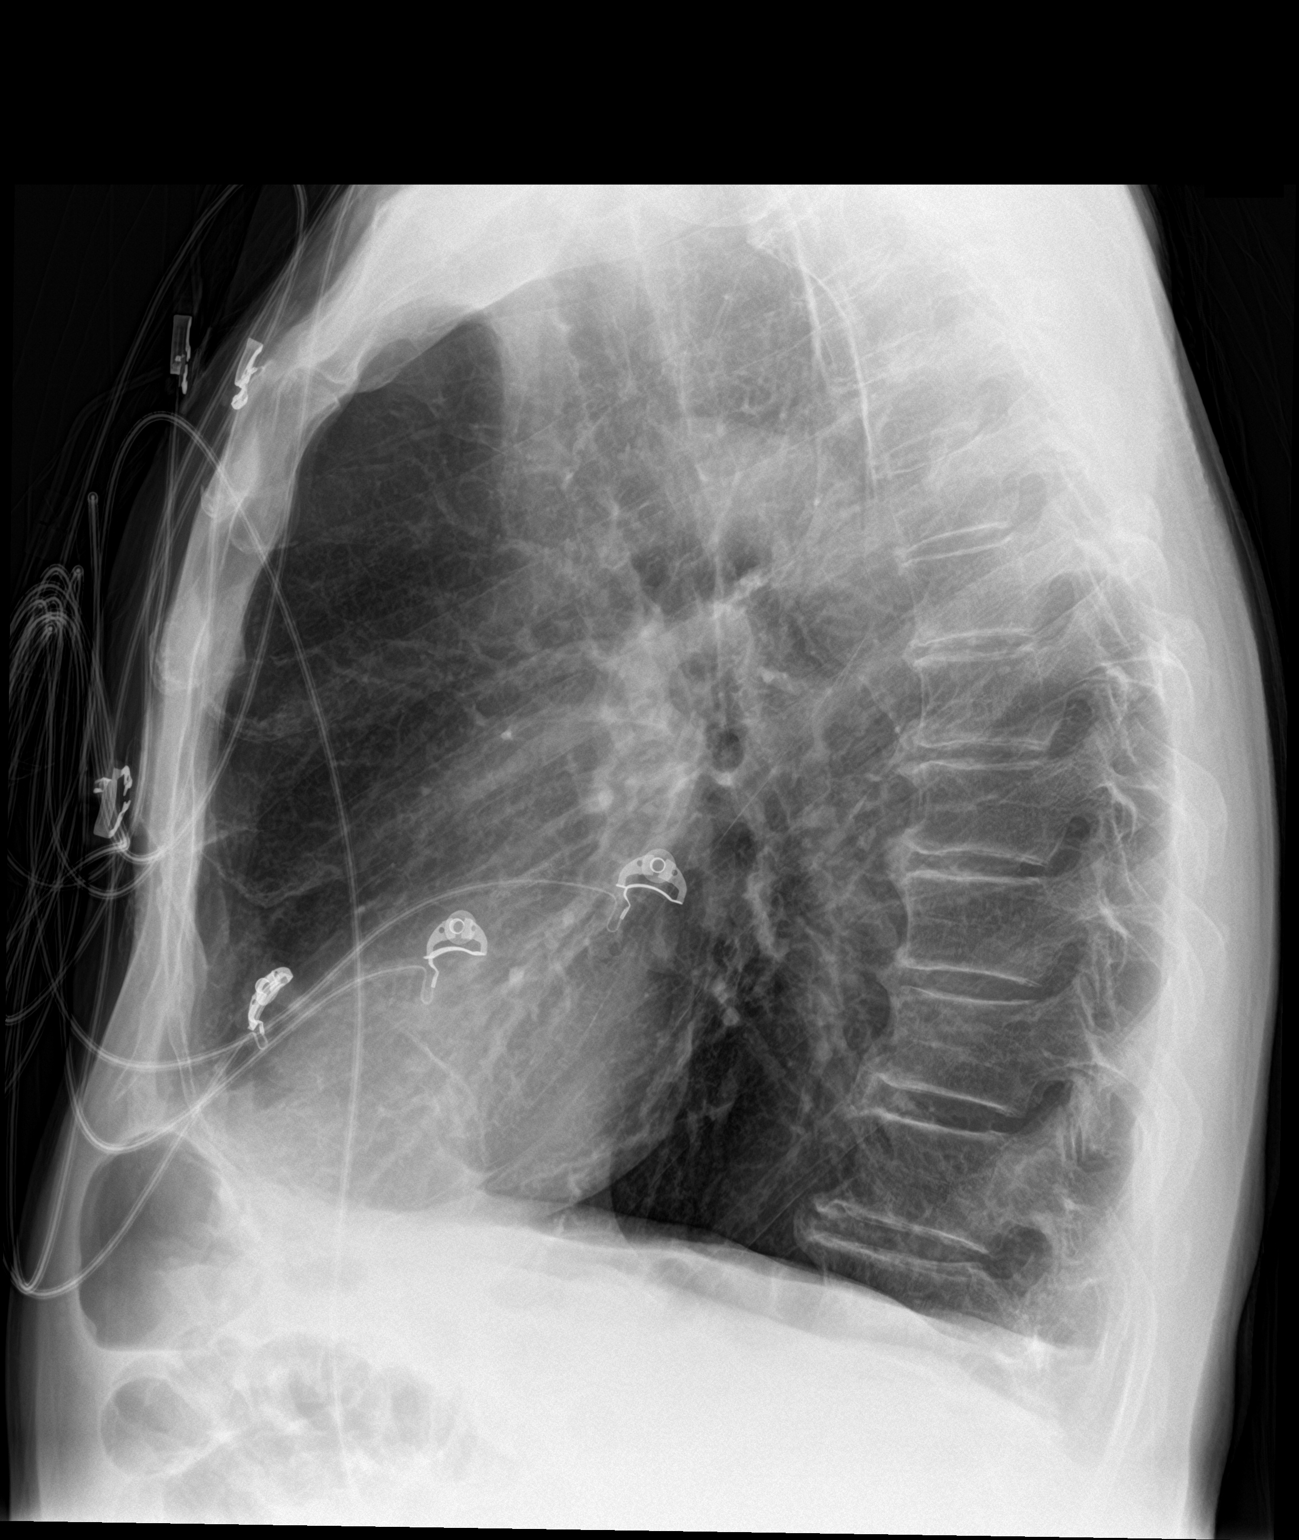

[2 of 2 positions shown; findings below may reference images not displayed]

FINDINGS: Cardiac shadow is within normal limits. The lungs are hyperaerated
consistent with COPD. No focal infiltrate or sizable effusion is
noted. No acute bony abnormality is seen.
IMPRESSION: COPD without acute abnormality.

## 2018-11-20 MED ORDER — IPRATROPIUM-ALBUTEROL 0.5-2.5 (3) MG/3ML IN SOLN
3.0000 mL | Freq: Once | RESPIRATORY_TRACT | Status: AC
  Administered 2018-11-20: 3 mL via RESPIRATORY_TRACT
  Filled 2018-11-20: qty 3

## 2018-11-20 MED ORDER — PREDNISONE 20 MG PO TABS: 60.0000 mg | ORAL_TABLET | Freq: Every day | ORAL | 0 refills | Status: AC

## 2018-11-20 MED ORDER — METHYLPREDNISOLONE SODIUM SUCC 125 MG IJ SOLR
125.0000 mg | Freq: Once | INTRAMUSCULAR | Status: AC
  Administered 2018-11-20: 125 mg via INTRAVENOUS
  Filled 2018-11-20: qty 2

## 2018-11-20 MED ORDER — DOXYCYCLINE HYCLATE 100 MG PO CAPS: 100.0000 mg | ORAL_CAPSULE | Freq: Two times a day (BID) | ORAL | 0 refills | Status: AC

## 2018-11-20 MED ORDER — DOXYCYCLINE HYCLATE 100 MG PO TABS
100.0000 mg | ORAL_TABLET | Freq: Once | ORAL | Status: AC
  Administered 2018-11-20: 100 mg via ORAL
  Filled 2018-11-20: qty 1

## 2018-11-20 NOTE — ED Triage Notes (Signed)
Pt arrived via ems for report of increased shortness of breath in the last 1-2 days - he was here last week for COPD exacerbation and since then has been wearing O2 cont at 2L - lung sounds diminished through out - ems gave albuterol and duoneb breathing treatment

## 2018-11-20 NOTE — ED Provider Notes (Signed)
[facility] Emergency Department Provider Note  ____________________________________________  Time seen: Approximately 5:14 PM  I have reviewed the triage vital signs and the nursing notes.   HISTORY  Chief Complaint Shortness of Breath   HPI Leslie Sims is a 66 y.o. male with a history of COPD on 2 L nasal cannula at bedtime, smoking, CAD, hypertension, hyperlipidemia who presents for evaluation of shortness of breath.  Patient reports productive cough, shortness of breath and wheezing that started 9 days ago.  He was seen here 5 days ago and given a prednisone taper.  He denies improvement of his symptoms.  For the last 5 days has been using oxygen around-the-clock.  Cough is productive of green sputum.  No chest pain, no fever or chills.  He continues to smoke.  No personal or family history of blood clots, leg pain or swelling, hemoptysis, exogenous hormones.  Shortness of breath is mild at rest and becomes severe with minimal exertion.  Past Medical History:  Diagnosis Date  . Allergic rhinitis   . Anxiety   . Back pain   . Colon polyps 08/2012   colonoscopy; multiple colon polyps; repeat colonoscopy in one year.  [name] COPD (chronic obstructive pulmonary disease) ([place])   . Coronary artery disease 11/2009   a. late presenting ant MI; b. LHC 100% pLAD s/p PCI/DES, 99% mRCA s/p PCI/DES, EF 35%; c. nuclear stress test 05/13: prior ant/inf infarcts w/o ischemia, EF 43%; d. LHC 02/14: widely patent stents with no other obs dz, EF 40%; e. LHC 11/17: LM nl, mLAD 10%, patent LAD stent, dLAD 20%, p-mRCA 10%, mRCA 40%, patent RCA stent, EF 35-45%   . Depression   . GERD (gastroesophageal reflux disease)   . Headache(784.0)   . Helicobacter pylori (H. pylori)   . Hemorrhoids   . Hernia    inguinal  . Hyperlipidemia   . Hypertension   . Insomnia   . Ischemic cardiomyopathy   . Myalgia   . Peptic ulcer   . Pulmonary nodules   . ST elevation (STEMI)  myocardial infarction involving left anterior descending coronary artery ([place]) 11/2009   a. s/p PCI to the LAD  . Status post dilation of esophageal narrowing 2000  . Tinea pedis   . Wears dentures    partial upper    Patient Active Problem List   Diagnosis Date Noted  . Acute respiratory failure with hypoxia ([place]) 09/29/2018  . Acute respiratory failure with hypoxia and hypercapnia ([place]) 09/13/2018  . Acute on chronic respiratory failure with hypoxia and hypercapnia ([place]) 08/24/2018  . Chest pain 07/02/2018  . Grief reaction 01/13/2018  . COPD with hypoxia ([place]) 12/25/2017  . Unstable angina ([place])   . Benign neoplasm of ascending colon   . Benign neoplasm of descending colon   . Benign neoplasm of sigmoid colon   . Personal history of colonic polyps   . Pulmonary nodules/lesions, multiple 01/05/2015  . Bruit of left carotid artery 11/04/2014  . Sleep disorder 07/18/2013  . Seasonal and perennial allergic rhinitis 05/29/2013  . Bradycardia 04/20/2011  . SMOKER 07/30/2010  . CAD, NATIVE VESSEL 04/23/2010  . Hyperlipidemia 03/24/2010  . DEPRESSION/ANXIETY 03/24/2010  . Chronic systolic heart failure ([place]) 03/24/2010  . COPD, very severe ([place]) 03/24/2010  . GERD 03/24/2010  . Essential hypertension 11/21/2009    Past Surgical History:  Procedure Laterality Date  . Admission  12/20/2012   COPD exacerbation.  [place].  [name] CARDIAC CATHETERIZATION    .  CARDIAC CATHETERIZATION  02/05/13   [facility]  . CARDIAC CATHETERIZATION  10/14   [facility] : patent stents with no change in anatomy. EF: 40$  . CARDIAC CATHETERIZATION Left 11/12/2016   Procedure: Left Heart Cath and Coronary Angiography;  Surgeon: Wellington Hampshire, MD;  Location: [place] [facility];  Service: Cardiovascular;  Laterality: Left;  . COLONOSCOPY    . COLONOSCOPY WITH PROPOFOL N/A 05/18/2016   Procedure: COLONOSCOPY WITH PROPOFOL;  Surgeon: Lucilla Lame, MD;  Location: [facility] ENDOSCOPY;  Service: Endoscopy;  Laterality: N/A;  .  CORONARY ANGIOPLASTY WITH STENT PLACEMENT  09/2009   LAD 3.0 X23 mm Xience DES, RCA: 4.0 X 15 mm Xience DES  . ELECTROMAGNETIC NAVIGATION BROCHOSCOPY N/A 10/27/2017   Procedure: ELECTROMAGNETIC NAVIGATION BRONCHOSCOPY;  Surgeon: Flora Lipps, MD;  Location: [facility] ORS;  Service: Cardiopulmonary;  Laterality: N/A;  . ESOPHAGEAL DILATION    . ESOPHAGOGASTRODUODENOSCOPY  2008  . ESOPHAGOGASTRODUODENOSCOPY  08/20/2012  . HERNIA REPAIR  07/20/2012   L inguinal hernia repair  . PENILE PROSTHESIS IMPLANT      Prior to Admission medications   Medication Sig Start Date End Date Taking? Authorizing Provider  acetaminophen (TYLENOL) 325 MG tablet Take 2 tablets (650 mg total) by mouth every 6 (six) hours as needed for mild pain (or Fever >/= 101). Patient not taking: Reported on 11/10/2018 08/26/18   [name], MD  Aclidinium Bromide (TUDORZA PRESSAIR) 400 MCG/ACT AEPB Inhale 1 puff into the lungs 2 (two) times daily. 11/14/18   Wilhelmina Mcardle, MD  albuterol (PROVENTIL HFA;VENTOLIN HFA) 108 (90 Base) MCG/ACT inhaler Inhale 2 puffs into the lungs every 6 (six) hours as needed for wheezing or shortness of breath. 11/01/18   Rudene Re, MD  ARIPiprazole (ABILIFY) 5 MG tablet Take 5 mg by mouth daily. 07/26/18   [provider]  aspirin 81 MG tablet Take 81 mg by mouth daily.      [provider]  atorvastatin (LIPITOR) 40 MG tablet Take 1 tablet (40 mg total) by mouth daily. 03/03/18   [name], MD  doxycycline (VIBRAMYCIN) 100 MG capsule Take 1 capsule (100 mg total) by mouth 2 (two) times daily for 7 days. 11/20/18 11/27/18  Rudene Re, MD  DULoxetine (CYMBALTA) 30 MG capsule duloxetine 30 mg capsule,delayed release    [provider]  feeding supplement, ENSURE ENLIVE, (ENSURE ENLIVE) LIQD Take 237 mLs by mouth 3 (three) times daily between meals. 10/01/18   Epifanio Lesches, MD  Fluticasone-Salmeterol (ADVAIR DISKUS) 500-50 MCG/DOSE AEPB Inhale 1 puff into  the lungs 2 (two) times daily. 07/27/17   [name], MD  furosemide (LASIX) 20 MG tablet Take 1 tablet (20 mg total) by mouth daily. 07/07/18 09/30/19  [name], MD  hydrOXYzine (VISTARIL) 25 MG capsule Take 25 mg by mouth 2 (two) times daily as needed. 06/05/18   [provider]  ipratropium-albuterol (DUONEB) 0.5-2.5 (3) MG/3ML SOLN Take 3 mLs by nebulization every 4 (four) hours as needed. DX:J44.9 11/10/18   Wilhelmina Mcardle, MD  LORazepam (ATIVAN) 1 MG tablet Take 1 tablet (1 mg total) by mouth every 8 (eight) hours as needed for anxiety. 07/07/18   [name], MD  losartan (COZAAR) 25 MG tablet TAKE 1 TABLET DAILY 01/27/18   Minna Merritts, MD  mirtazapine (REMERON) 30 MG tablet Take 1 tablet (30 mg total) by mouth at bedtime. 06/24/18   [name], MD  Multiple Vitamin (MULTIVITAMIN) capsule Take 1 capsule by mouth daily.  [provider]  nitroGLYCERIN (NITROSTAT) 0.4 MG SL tablet Place 1 tablet (0.4 mg total) under the tongue every 5 (five) minutes as needed. Patient not taking: Reported on 11/10/2018 10/17/17   Wellington Hampshire, MD  predniSONE (DELTASONE) 20 MG tablet Take 3 tablets (60 mg total) by mouth daily for 4 days. 11/20/18 11/24/18  Rudene Re, MD  QUEtiapine (SEROQUEL) 25 MG tablet Take 25 mg by mouth at bedtime.  11/07/18 12/07/18  [provider]  sertraline (ZOLOFT) 100 MG tablet Take 100 mg by mouth daily.  06/28/18   [provider]  SPIRIVA HANDIHALER 18 MCG inhalation capsule PLACE 1 CAPSULE INTO INHALER AND INHALE THE CONTENTS DAILY 07/26/18   Trinna Post, PA-C  sucralfate (CARAFATE) 1 GM/10ML suspension Take 10 mLs (1 g total) by mouth 4 (four) times daily -  with meals and at bedtime. Patient taking differently: Take 1 g by mouth 4 (four) times daily as needed.  05/16/18   [name], MD    Allergies Prozac [fluoxetine hcl]; Effexor xr [venlafaxine hcl er]; and Wellbutrin [bupropion]  Family  History  Problem Relation Age of Onset  . Heart attack Brother        Brother #1  . Diabetes Brother   . Hypertension Brother        #3  . Coronary artery disease Father 56       deceased  . Heart attack Father   . Diabetes Father   . Heart disease Father   . COPD Mother 28       deceased  . Alcohol abuse Sister        polysubstance abuse  . COPD Sister   . Lung cancer Sister   . Alcohol abuse Sister        polysubstance abuse  . Penile cancer Brother   . Diabetes Brother     Social History Social History   Tobacco Use  . Smoking status: Current Every Day Smoker    Packs/day: 1.00    Years: 42.00    Pack years: 42.00    Types: Cigarettes  . Smokeless tobacco: Never Used  Substance Use Topics  . Alcohol use: No    Alcohol/week: 0.0 standard drinks  . Drug use: No    Review of Systems  Constitutional: Negative for fever. Eyes: Negative for visual changes. ENT: Negative for sore throat. Neck: No neck pain  Cardiovascular: Negative for chest pain. Respiratory: + shortness of breath, wheezing, cough Gastrointestinal: Negative for abdominal pain, vomiting or diarrhea. Genitourinary: Negative for dysuria. Musculoskeletal: Negative for back pain. Skin: Negative for rash. Neurological: Negative for headaches, weakness or numbness. Psych: No SI or HI  ____________________________________________   PHYSICAL EXAM:  VITAL SIGNS: ED Triage Vitals  Enc Vitals Group     BP 11/20/18 1626 123/77     Pulse Rate 11/20/18 1626 96     Resp 11/20/18 1626 17     Temp 11/20/18 1626 98.4 F (36.9 C)     Temp Source 11/20/18 1626 Oral     SpO2 11/20/18 1623 96 %     Weight 11/20/18 1626 144 lb (65.3 kg)     Height 11/20/18 1626 5\' 10"  (1.778 m)     Head Circumference --      Peak Flow --      Pain Score 11/20/18 1626 0     Pain Loc --      Pain Edu? --      Excl. in [place]? --  Constitutional: Alert and oriented. Well appearing and in no apparent  distress. HEENT:      Head: Normocephalic and atraumatic.         Eyes: Conjunctivae are normal. Sclera is non-icteric.       Mouth/Throat: Mucous membranes are moist.       Neck: Supple with no signs of meningismus. Cardiovascular: Regular rate and rhythm. No murmurs, gallops, or rubs. 2+ symmetrical distal pulses are present in all extremities. No JVD. Respiratory: Slightly increased work of breathing, satting 96% on 2 L baseline, severely diminished air movement bilaterally with no crackles or wheezes or rhonchi.   Gastrointestinal: Soft, non tender, and non distended with positive bowel sounds. No rebound or guarding. Musculoskeletal: Nontender with normal range of motion in all extremities. No edema, cyanosis, or erythema of extremities. Neurologic: Normal speech and language. Face is symmetric. Moving all extremities. No gross focal neurologic deficits are appreciated. Skin: Skin is warm, dry and intact. No rash noted. Psychiatric: Mood and affect are normal. Speech and behavior are normal.  ____________________________________________   LABS (all labs ordered are listed, but only abnormal results are displayed)  Labs Reviewed  BASIC METABOLIC PANEL - Abnormal; Notable for the following components:      Result Value   Chloride 94 (*)    CO2 39 (*)    All other components within normal limits  CBC  TROPONIN I   ____________________________________________  EKG  ED ECG REPORT I, Rudene Re, the attending physician, personally viewed and interpreted this ECG.  Normal sinus rhythm, rate of 95, normal intervals, LAFB, left axis deviation, T wave inversions in 1 and aVL with no ST elevations or depressions.  Unchanged from prior. ____________________________________________  RADIOLOGY  I have personally reviewed the images performed during this visit and I agree with the Radiologist's read.   Interpretation by Radiologist:  Dg Chest 2 View  Result Date:  11/20/2018 CLINICAL DATA:  Chest discomfort for 1 week EXAM: CHEST - 2 VIEW COMPARISON:  11/15/2018 FINDINGS: Cardiac shadow is within normal limits. The lungs are hyperaerated consistent with COPD. No focal infiltrate or sizable effusion is noted. No acute bony abnormality is seen. IMPRESSION: COPD without acute abnormality. Electronically Signed   By: Inez Catalina M.D.   On: 11/20/2018 16:54     ____________________________________________   PROCEDURES  Procedure(s) performed: None Procedures Critical Care performed:  None ____________________________________________   INITIAL IMPRESSION / ASSESSMENT AND PLAN / ED COURSE  67 y.o. male with a history of COPD on 2 L nasal cannula at bedtime, smoking, CAD, hypertension, hyperlipidemia who presents for evaluation of shortness of breath, wheezing, and productive cough x 9 days.  Patient with mildly increased work of breathing, satting well on baseline 2 L, severely diminished air movement bilaterally with no crackles or wheezes.  Unfortunately patient continues to smoke.  Chest x-ray with no evidence of pneumothorax or pneumonia.  Low suspicion for PE with no tachycardia, no chest pain, no leg swelling, no hemoptysis, no new hypoxia. Will give duoneb x 3, solumedrol and start patient on doxy.   Clinical Course as of Nov 21 2319  Mon Nov 20, 2018  5462 Patient reports feeling better.  Remain with normal work of breathing.  Will be discharged home on albuterol, doxycycline, and prednisone.  Recommend close follow-up with primary care doctor.  Discussed standard return precautions.   [CV]    Clinical Course User Index [CV] Rudene Re, MD     As part of my medical decision  making, I reviewed the following data within the [place] notes reviewed and incorporated, Labs reviewed , EKG interpreted , Old EKG reviewed, Old chart reviewed, Radiograph reviewed , Notes from prior ED visits and [place] Controlled Substance  Database    Pertinent labs & imaging results that were available during my care of the patient were reviewed by me and considered in my medical decision making (see chart for details).    ____________________________________________   FINAL CLINICAL IMPRESSION(S) / ED DIAGNOSES  Final diagnoses:  Bronchitis      NEW MEDICATIONS STARTED DURING THIS VISIT:  ED Discharge Orders         Ordered    doxycycline (VIBRAMYCIN) 100 MG capsule  2 times daily     11/20/18 1906    predniSONE (DELTASONE) 20 MG tablet  Daily     11/20/18 1906           Note:  This document was prepared using Dragon voice recognition software and may include unintentional dictation errors.    Rudene Re, MD 11/20/18 [phone]

## 2018-12-04 ENCOUNTER — Encounter: Payer: Self-pay | Admitting: Pulmonary Disease

## 2018-12-04 ENCOUNTER — Ambulatory Visit (INDEPENDENT_AMBULATORY_CARE_PROVIDER_SITE_OTHER): Payer: Medicare Other | Admitting: Pulmonary Disease

## 2018-12-04 VITALS — BP 118/60 | HR 98 | Resp 16 | Ht 70.0 in | Wt 140.0 lb

## 2018-12-04 DIAGNOSIS — J449 Chronic obstructive pulmonary disease, unspecified: Secondary | ICD-10-CM

## 2018-12-04 DIAGNOSIS — I2510 Atherosclerotic heart disease of native coronary artery without angina pectoris: Secondary | ICD-10-CM | POA: Diagnosis not present

## 2018-12-04 DIAGNOSIS — J9611 Chronic respiratory failure with hypoxia: Secondary | ICD-10-CM

## 2018-12-04 DIAGNOSIS — F17200 Nicotine dependence, unspecified, uncomplicated: Secondary | ICD-10-CM | POA: Diagnosis not present

## 2018-12-04 MED ORDER — IPRATROPIUM-ALBUTEROL 0.5-2.5 (3) MG/3ML IN SOLN: 3.0000 mL | Freq: Four times a day (QID) | RESPIRATORY_TRACT | 3 refills | Status: DC

## 2018-12-04 MED ORDER — BUDESONIDE 0.25 MG/2ML IN SUSP: 0.2500 mg | Freq: Two times a day (BID) | RESPIRATORY_TRACT | 10 refills | Status: DC

## 2018-12-04 NOTE — Patient Instructions (Addendum)
We again discussed the importance of smoking cessation and complete abstinence from all cigarettes.  If you cannot get by without nicotine, I recommend nicotine replacement options such as patch, gum, lozenges  I am going to change all of your COPD medications to nebulizer form as follows:  1) budesonide (Pulmicort) 0.25 mg nebulized twice a day 2) DuoNeb nebulized 4 times a day  You may use an extra dose of DuoNeb in between scheduled treatments as needed You may continue to use albuterol inhaler as needed when you are out of the house  Continue oxygen therapy is close to 24 hours/day as possible (minimum of 20 hours/day)  Follow-up in 3 to 4 months.  Call sooner if needed

## 2018-12-04 NOTE — Progress Notes (Signed)
PULMONARY OFFICE FOLLOW UP NOTE  Requesting MD/Service: [name], MD Date of initial consultation: 08/10/16 Reason for consultation: COPD, smoker  PT PROFILE: 80 M smoker with severe emphysema seen by multiple pulmonologists in past ([place], [place], [place], [place]) referred for further eval and mgmt of COPD   DATA: 07/24/12 Office Spirometry: Severe obstruction (FEV1 31% pred) 08/31/13 Office Spirometry: very severe obstruction (FEV1 25% pred) 06/18/15 CT chest: Severe emphysema 03/03/17 Overnight oximetry: Lowest SpO2 72%. 95% of time SpO2 was below 90% 03/03/17 6MWT: 384 meters. Desat to 84% 10/04/17 CT chest: Two new irregularly marginated nodules, one in the right upper lobe, and a second in the mid left upper lobe worrisome for either synchronous lung carcinoma or metastatic involvement of the lungs. The two small nodules described on the prior dictated report have resolved in the left superior segment and most likely were postinflammatory. Diffuse centrilobular emphysema. 10/13/17 PET scan: The new left upper lobe nodule is FDG avid consistent with malignancy. Recommend tissue confirmation. The new nodule in the right upper lobe demonstrates low level uptake. While the level of uptake suggests the possibility of an inflammatory or infectious process, a low-grade malignancy is not excluded. Given the suspected malignancy on the left, tissue confirmation should be considered on the right despite the low level of uptake. No distant metastatic disease identified 10/27/17 ENB ([place]): Transbronchial Fine Needle Aspirations 21G X7, Transbronchial Forceps Biopsy X6. ALVEOLATED LUNG TISSUE WITH HEMOSIDERIN LADEN MACROPHAGES.  NEGATIVE FOR ATYPIA AND MALIGNANCY 12/07/17 CTA chest: No pulmonary embolus identified. Anterior left upper lobe ground-glass and nodular consolidation likely representing pneumonitis. Stable bilateral upper lobe pulmonary nodules. Stable emphysema. 01/21/18 CT chest: There  is new masslike architectural distortion and ground-glass attenuation surrounding the index nodule within the right upper lobe. The appearance favors an inflammatory or infectious process. Underlying tumor is considered less favored but not excluded and follow-up imaging with a repeat CT of the chest following appropriate antibiotic therapy is recommended in 3-4 weeks following trial of antibiotic therapy to ensure resolution and exclude underlying malignancy. The index nodule in the left upper lobe is slightly decreased in volume when compared with the previous exam favoring a benign process. Attention on follow-up imaging advise. Advanced changes of emphysema (very severe) 06/01/18 CTA chest: No evidence of pulmonary embolism. Waxing and waning bilateral pulmonary nodular process as described. New 8 mm nodular ill-defined focus of opacification over the lingula which may represent an acute inflammatory or infectious process. No effusion. Emphysema  10/19/18 CTA chest: No acute cardiopulmonary disease and no evidence of pulmonary embolism.  INTERVAL: Last seen by me 09/05/2018.  Hospitalized in October for COPD exacerbation.  Seen in emergency department on several occasions with respiratory problems since October.   SUBJ: This is a scheduled follow-up.  Presently, he is at his baseline.  He reports class III-IV dyspnea.  There is little day-to-day variation.  He continues to smoke 10 cigarettes/day.  He continues to have chronic cough with mucus production.  He denies hemoptysis.  He has had no fevers or pleuritic chest pain.  He denies lower extremity edema and calf tenderness.  He is presently using Advair and Spiriva.  In addition, he is using albuterol rescue inhaler 1-2 times per day and is using nebulized ipratropium-albuterol 4 times per day.  He is using the DuoNeb on a schedule as recommended by his primary care physician.  OBJ: Vitals:   12/04/18 1413 12/04/18 1419  BP:  118/60  Pulse:  98   Resp: 16  SpO2:  95%  Weight: 140 lb (63.5 kg)   Height: 5\' 10"  (1.778 m)   2 LPM   EXAM:  Appears much older than true age In wheelchair and wearing oxygen HEENT without acute findings No JVD, lymphadenopathy Breath sounds markedly diminished without wheezes Regular, no murmurs NABS, NT Extremities warm without clubbing, edema Extensive tobacco stains on digits of both hands No focal neurologic deficits  DATA:   BMP Latest Ref Rng & Units 11/20/2018 11/15/2018 11/01/2018  Glucose 70 - 99 mg/dL 96 106(H) 108(H)  BUN 8 - 23 mg/dL 11 9 10   Creatinine 0.61 - 1.24 mg/dL 0.69 0.70 0.76  BUN/Creat Ratio 10 - 24 - - -  Sodium 135 - 145 mmol/L 142 142 141  Potassium 3.5 - 5.1 mmol/L 3.6 3.4(L) 3.6  Chloride 98 - 111 mmol/L 94(L) 99 98  CO2 22 - 32 mmol/L 39(H) 36(H) 31  Calcium 8.9 - 10.3 mg/dL 9.4 8.7(L) 9.0    CBC Latest Ref Rng & Units 11/20/2018 11/15/2018 11/01/2018  WBC 4.0 - 10.5 K/uL 6.6 7.1 7.7  Hemoglobin 13.0 - 17.0 g/dL 13.3 12.8(L) 14.2  Hematocrit 39.0 - 52.0 % 41.8 40.5 43.6  Platelets 150 - 400 K/uL 232 213 264   CXR 12/02: Hyperinflation, hyperlucency consistent with emphysema.  Platelike atelectasis in RUL, unchanged.  No acute findings  IMPRESSION:   Chronic hypoxemic respiratory failure (HCC)  COPD, very severe (HCC)  Smoker   PLAN:  We again discussed the importance of smoking cessation. I recommended that he try nicotine replacement therapy if unable to liberate himself from nicotine addiction  Since he is already using nebulized medications 4 times a day, we will switch him completely over to a nebulizer regimen as follows: 1) budesonide (Pulmicort) 0.25 mg nebulized twice a day 2) DuoNeb nebulized 4 times a day  I instructed that he may also take an extra dose of DuoNeb in between scheduled treatments as needed He is to carry his albuterol inhaler with him when he is out of the house  I recommended that he continue oxygen therapy as close to  24 hours/day as possible  Follow-up in 3-4 months.  Call sooner if needed  Merton Border, MD PCCM service Mobile [phone] Pager [phone] 12/04/2018  2:51 PM

## 2018-12-16 ENCOUNTER — Emergency Department
Admission: EM | Admit: 2018-12-16 | Discharge: 2018-12-16 | Disposition: A | Discharge status: Home / Self Care | Payer: Medicare Other | Attending: Emergency Medicine | Admitting: Emergency Medicine

## 2018-12-16 ENCOUNTER — Other Ambulatory Visit: Payer: Self-pay

## 2018-12-16 ENCOUNTER — Emergency Department: Payer: Medicare Other

## 2018-12-16 DIAGNOSIS — I2510 Atherosclerotic heart disease of native coronary artery without angina pectoris: Secondary | ICD-10-CM | POA: Diagnosis not present

## 2018-12-16 DIAGNOSIS — R0602 Shortness of breath: Secondary | ICD-10-CM | POA: Diagnosis present

## 2018-12-16 DIAGNOSIS — F17210 Nicotine dependence, cigarettes, uncomplicated: Secondary | ICD-10-CM | POA: Insufficient documentation

## 2018-12-16 DIAGNOSIS — Z79899 Other long term (current) drug therapy: Secondary | ICD-10-CM | POA: Diagnosis not present

## 2018-12-16 DIAGNOSIS — J40 Bronchitis, not specified as acute or chronic: Secondary | ICD-10-CM | POA: Insufficient documentation

## 2018-12-16 DIAGNOSIS — R05 Cough: Secondary | ICD-10-CM | POA: Diagnosis not present

## 2018-12-16 DIAGNOSIS — J449 Chronic obstructive pulmonary disease, unspecified: Secondary | ICD-10-CM | POA: Insufficient documentation

## 2018-12-16 DIAGNOSIS — I10 Essential (primary) hypertension: Secondary | ICD-10-CM | POA: Diagnosis not present

## 2018-12-16 DIAGNOSIS — Z9981 Dependence on supplemental oxygen: Secondary | ICD-10-CM | POA: Diagnosis not present

## 2018-12-16 DIAGNOSIS — Z7982 Long term (current) use of aspirin: Secondary | ICD-10-CM | POA: Diagnosis not present

## 2018-12-16 DIAGNOSIS — I252 Old myocardial infarction: Secondary | ICD-10-CM | POA: Insufficient documentation

## 2018-12-16 LAB — CBC
HCT: 39.8 % (ref 39.0–52.0)
Hemoglobin: 12.4 g/dL — ABNORMAL LOW (ref 13.0–17.0)
MCH: 29.2 pg (ref 26.0–34.0)
MCHC: 31.2 g/dL (ref 30.0–36.0)
MCV: 93.6 fL (ref 80.0–100.0)
PLATELETS: 280 10*3/uL (ref 150–400)
RBC: 4.25 MIL/uL (ref 4.22–5.81)
RDW: 12 % (ref 11.5–15.5)
WBC: 6.7 10*3/uL (ref 4.0–10.5)
nRBC: 0 % (ref 0.0–0.2)

## 2018-12-16 LAB — BASIC METABOLIC PANEL
Anion gap: 7 (ref 5–15)
BUN: 13 mg/dL (ref 8–23)
CALCIUM: 9.1 mg/dL (ref 8.9–10.3)
CO2: 38 mmol/L — ABNORMAL HIGH (ref 22–32)
CREATININE: 0.58 mg/dL — AB (ref 0.61–1.24)
Chloride: 96 mmol/L — ABNORMAL LOW (ref 98–111)
GFR calc Af Amer: >60 mL/min (ref >60)
Glucose, Bld: 101 mg/dL — ABNORMAL HIGH (ref 70–99)
POTASSIUM: 3.8 mmol/L (ref 3.5–5.1)
SODIUM: 141 mmol/L (ref 135–145)

## 2018-12-16 LAB — INFLUENZA PANEL BY PCR (TYPE A & B)
Influenza A By PCR: NEGATIVE
Influenza B By PCR: NEGATIVE

## 2018-12-16 LAB — TROPONIN I: Troponin I: <0.03 ng/mL (ref <0.03)

## 2018-12-16 IMAGING — CR DG CHEST 2V
2 series · 2 of 2 positions shown · non-contrast
Comparison: Chest x-ray dated [DATE]

CLINICAL DATA: Acute on chronic shortness of breath.

EXAM:
CHEST - 2 VIEW

[chest pa]
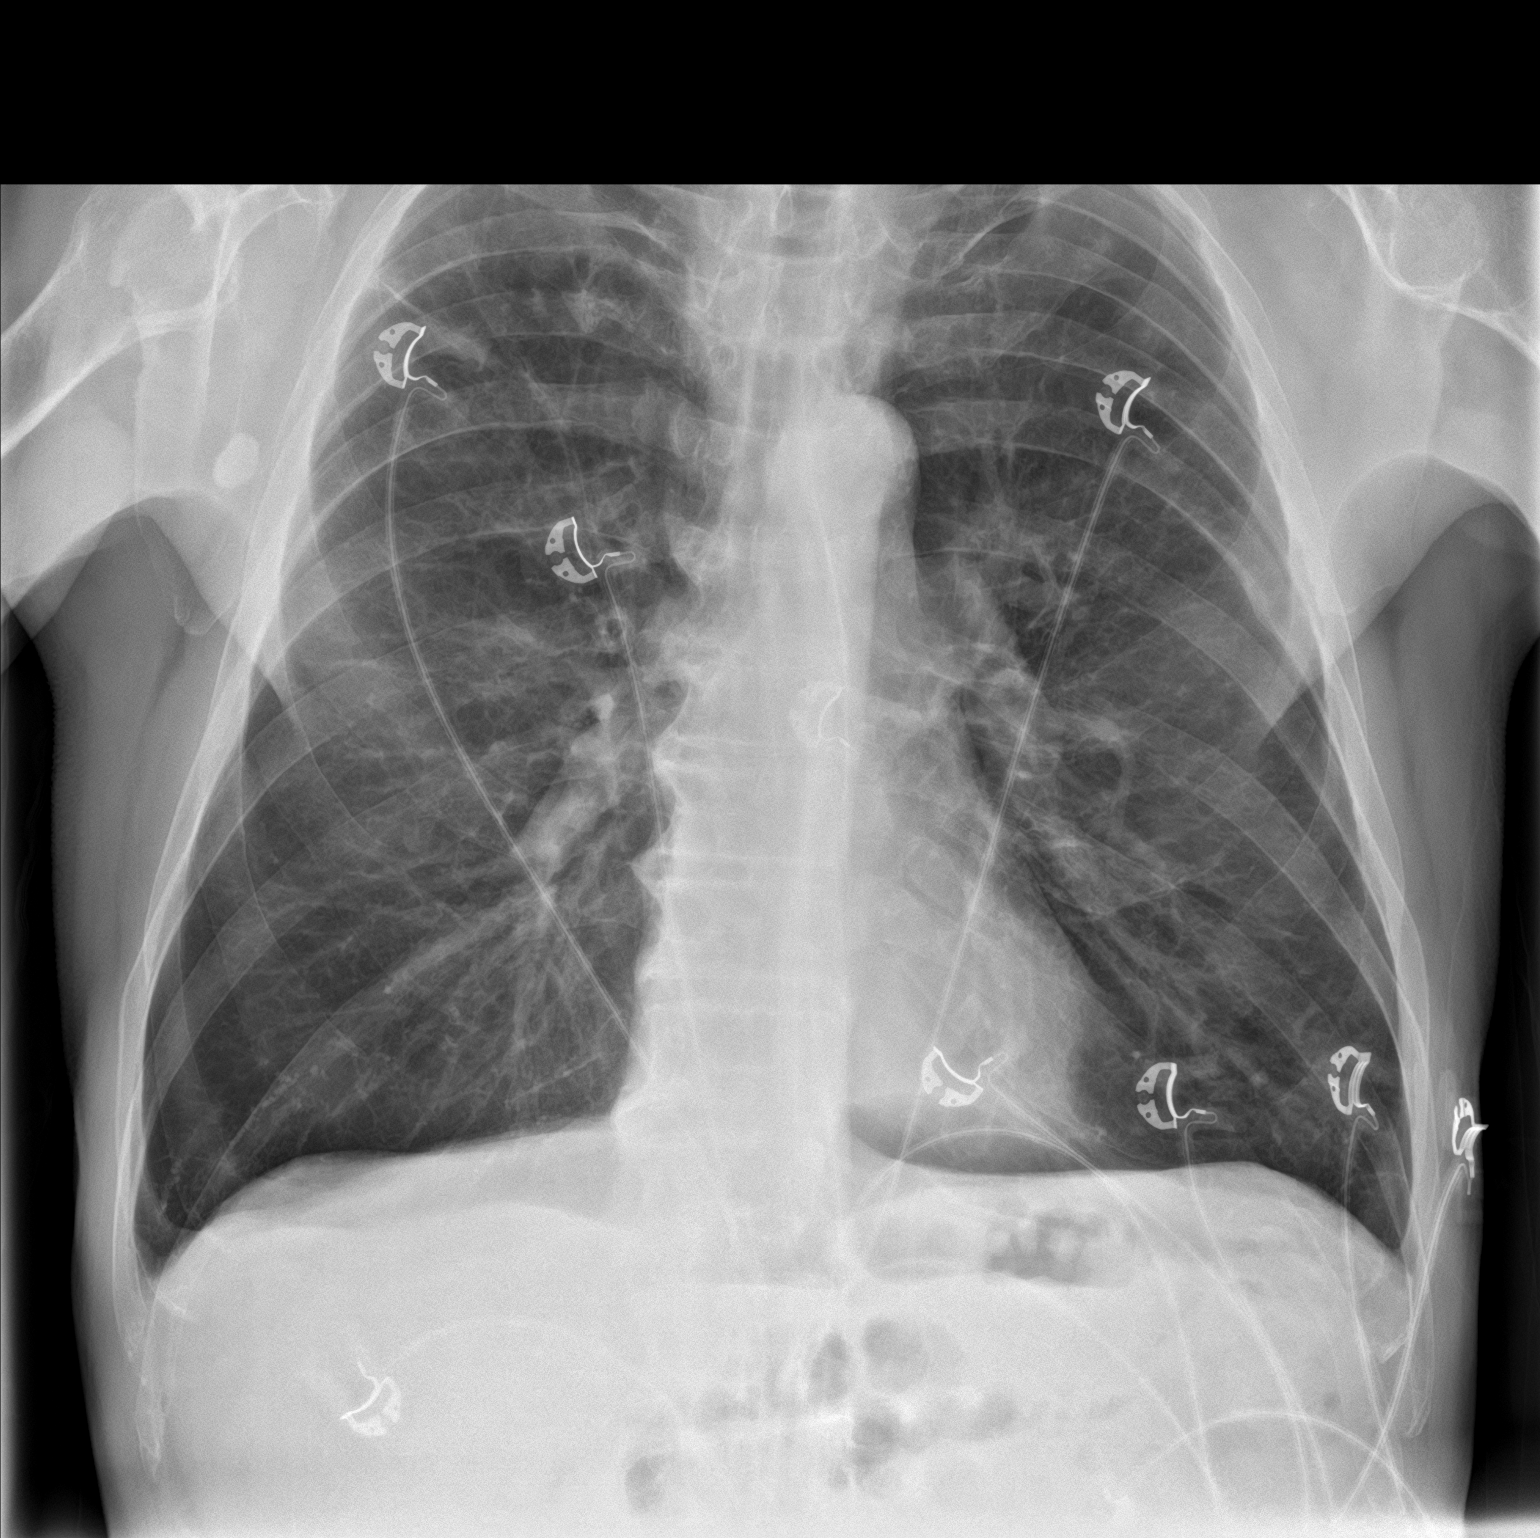

[chest lat]
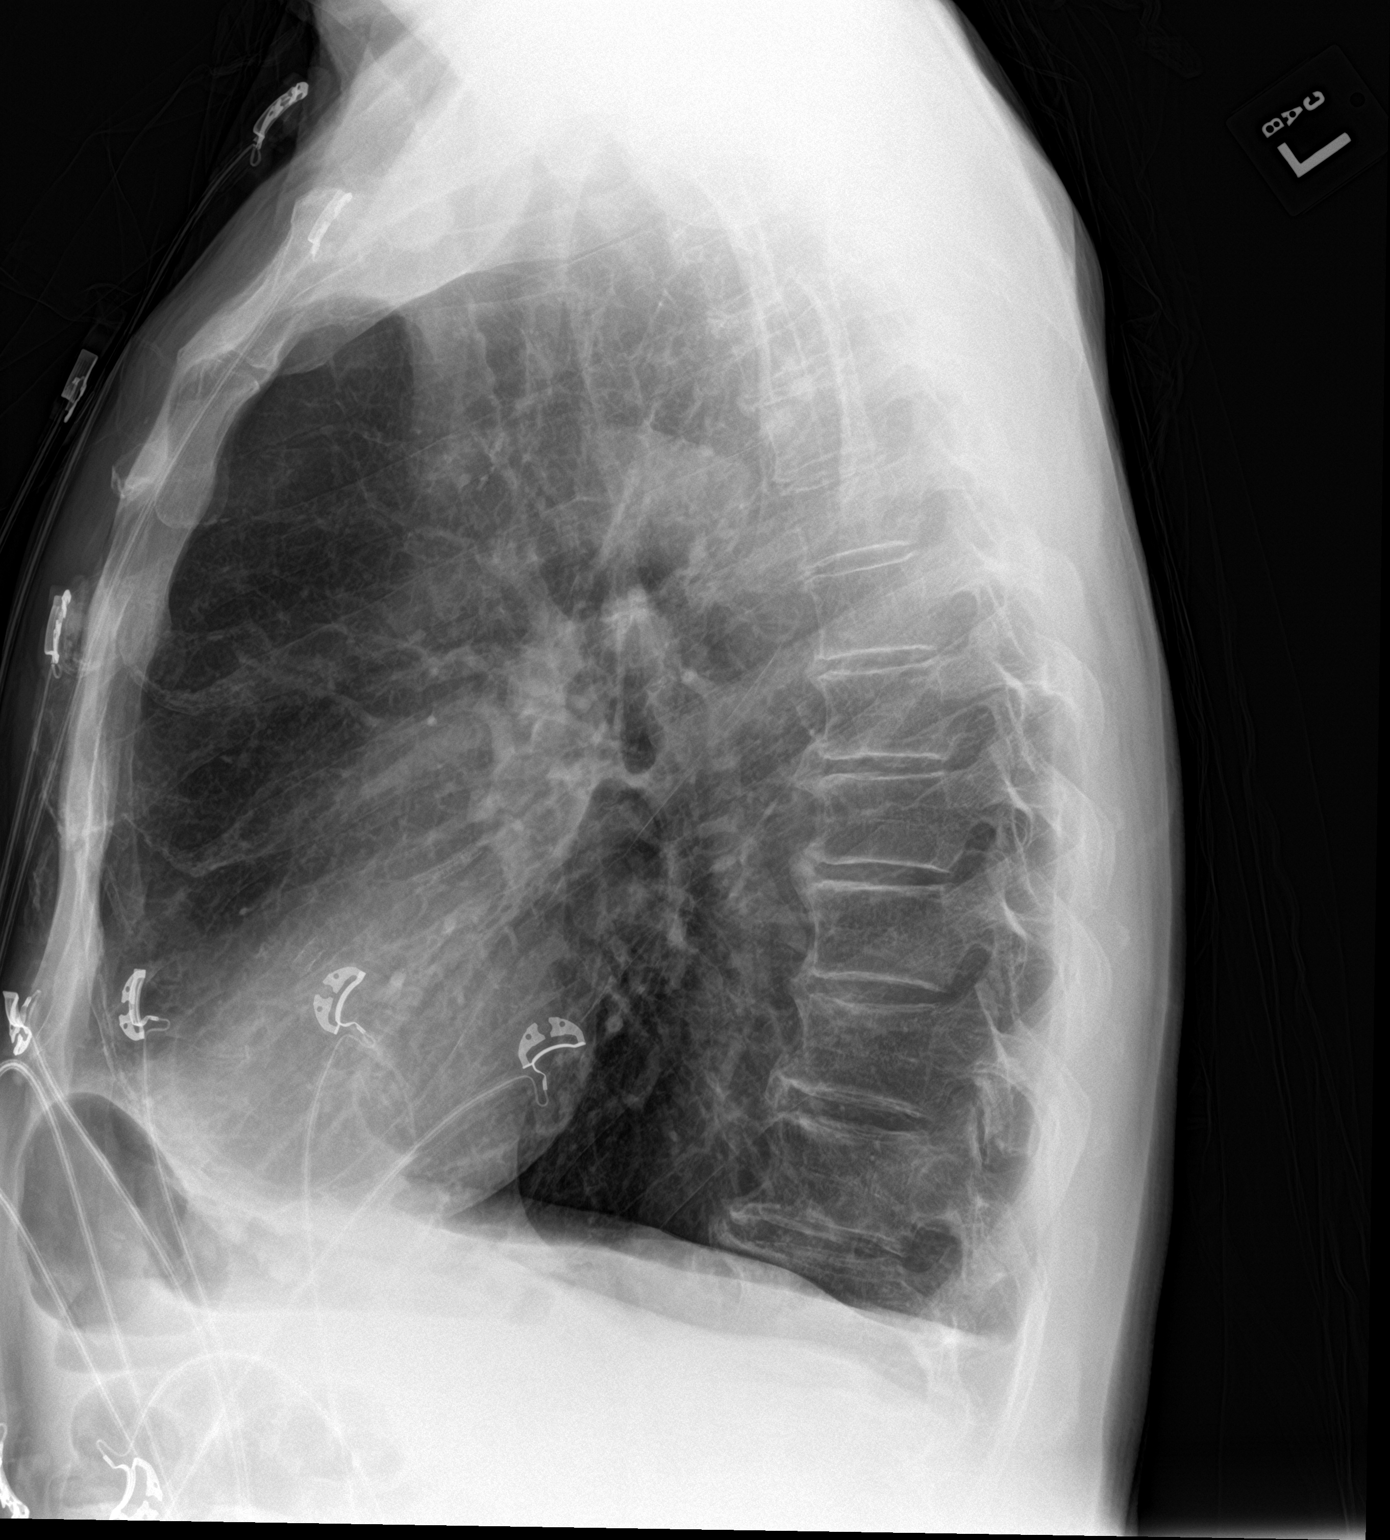

[2 of 2 positions shown; findings below may reference images not displayed]

FINDINGS: The lungs are hyperinflated with flattening of the diaphragm
consistent with COPD. No acute infiltrates or effusions. Heart size
and vascularity are normal. No acute bone abnormality.
IMPRESSION: No acute cardiopulmonary abnormality.

COPD.

## 2018-12-16 MED ORDER — SODIUM CHLORIDE 0.9 % IV BOLUS: 500.0000 mL | Freq: Once | INTRAVENOUS | Status: DC

## 2018-12-16 MED ORDER — IPRATROPIUM-ALBUTEROL 0.5-2.5 (3) MG/3ML IN SOLN
3.0000 mL | Freq: Once | RESPIRATORY_TRACT | Status: AC
  Administered 2018-12-16: 3 mL via RESPIRATORY_TRACT
  Filled 2018-12-16: qty 3

## 2018-12-16 MED ORDER — DOXYCYCLINE HYCLATE 100 MG PO CAPS: 100.0000 mg | ORAL_CAPSULE | Freq: Two times a day (BID) | ORAL | 0 refills | Status: AC

## 2018-12-16 MED ORDER — DOXYCYCLINE HYCLATE 100 MG PO TABS
100.0000 mg | ORAL_TABLET | Freq: Once | ORAL | Status: AC
  Administered 2018-12-16: 100 mg via ORAL
  Filled 2018-12-16: qty 1

## 2018-12-16 MED ORDER — ALBUTEROL SULFATE (2.5 MG/3ML) 0.083% IN NEBU
10.0000 mg | INHALATION_SOLUTION | Freq: Once | RESPIRATORY_TRACT | Status: AC
  Administered 2018-12-16: 10 mg via RESPIRATORY_TRACT
  Filled 2018-12-16: qty 12

## 2018-12-16 MED ORDER — PREDNISONE 20 MG PO TABS: 60.0000 mg | ORAL_TABLET | Freq: Every day | ORAL | 0 refills | Status: AC

## 2018-12-16 NOTE — ED Notes (Signed)
Neb tx complete. Pt placed back on 3L of O2.

## 2018-12-16 NOTE — Discharge Instructions (Addendum)
Please seek medical attention for any high fevers, chest pain, shortness of breath, change in behavior, persistent vomiting, bloody stool or any other new or concerning symptoms.  

## 2018-12-16 NOTE — ED Notes (Signed)
Patient transported to X-ray 

## 2018-12-16 NOTE — ED Provider Notes (Signed)
[facility] Emergency Department Provider Note  ____________________________________________  Time seen: Approximately 5:42 PM  I have reviewed the triage vital signs and the nursing notes.   HISTORY  Chief Complaint Shortness of Breath   HPI Leslie Sims is a 67 y.o. male with a history of COPD on 2 L nasal cannula at bedtime, smoking, CAD, hypertension, hyperlipidemia who presents for evaluation of shortness of breath.  Patient reports progressively worsening shortness of breath over the last 3 to 4 days.  Has a cough productive of clear sputum.  Denies chest pain or fever.  Has been using his 2 L nasal cannula 24 hours for the last few days.  No vomiting or diarrhea.  Has been using his inhalers.  Continues to smoke.  Patient reports that his symptoms have been constant for the last 24 hours and severe.  Past Medical History:  Diagnosis Date  . Allergic rhinitis   . Anxiety   . Back pain   . Colon polyps 08/2012   colonoscopy; multiple colon polyps; repeat colonoscopy in one year.  [name] COPD (chronic obstructive pulmonary disease) ([place])   . Coronary artery disease 11/2009   a. late presenting ant MI; b. LHC 100% pLAD s/p PCI/DES, 99% mRCA s/p PCI/DES, EF 35%; c. nuclear stress test 05/13: prior ant/inf infarcts w/o ischemia, EF 43%; d. LHC 02/14: widely patent stents with no other obs dz, EF 40%; e. LHC 11/17: LM nl, mLAD 10%, patent LAD stent, dLAD 20%, p-mRCA 10%, mRCA 40%, patent RCA stent, EF 35-45%   . Depression   . GERD (gastroesophageal reflux disease)   . Headache(784.0)   . Helicobacter pylori (H. pylori)   . Hemorrhoids   . Hernia    inguinal  . Hyperlipidemia   . Hypertension   . Insomnia   . Ischemic cardiomyopathy   . Myalgia   . Peptic ulcer   . Pulmonary nodules   . ST elevation (STEMI) myocardial infarction involving left anterior descending coronary artery ([place]) 11/2009   a. s/p PCI to the LAD  . Status post dilation of  esophageal narrowing 2000  . Tinea pedis   . Wears dentures    partial upper    Patient Active Problem List   Diagnosis Date Noted  . Acute respiratory failure with hypoxia ([place]) 09/29/2018  . Acute respiratory failure with hypoxia and hypercapnia ([place]) 09/13/2018  . Acute on chronic respiratory failure with hypoxia and hypercapnia ([place]) 08/24/2018  . Chest pain 07/02/2018  . Grief reaction 01/13/2018  . COPD with hypoxia ([place]) 12/25/2017  . Unstable angina ([place])   . Benign neoplasm of ascending colon   . Benign neoplasm of descending colon   . Benign neoplasm of sigmoid colon   . Personal history of colonic polyps   . Pulmonary nodules/lesions, multiple 01/05/2015  . Bruit of left carotid artery 11/04/2014  . Sleep disorder 07/18/2013  . Seasonal and perennial allergic rhinitis 05/29/2013  . Bradycardia 04/20/2011  . SMOKER 07/30/2010  . CAD, NATIVE VESSEL 04/23/2010  . Hyperlipidemia 03/24/2010  . DEPRESSION/ANXIETY 03/24/2010  . Chronic systolic heart failure ([place]) 03/24/2010  . COPD, very severe ([place]) 03/24/2010  . GERD 03/24/2010  . Essential hypertension 11/21/2009    Past Surgical History:  Procedure Laterality Date  . Admission  12/20/2012   COPD exacerbation.  [facility].  [name] CARDIAC CATHETERIZATION    . CARDIAC CATHETERIZATION  02/05/13   [facility]  . CARDIAC CATHETERIZATION  10/14   [facility] : patent stents with  no change in anatomy. EF: 40$  . CARDIAC CATHETERIZATION Left 11/12/2016   Procedure: Left Heart Cath and Coronary Angiography;  Surgeon: Wellington Hampshire, MD;  Location: [place] [facility];  Service: Cardiovascular;  Laterality: Left;  . COLONOSCOPY    . COLONOSCOPY WITH PROPOFOL N/A 05/18/2016   Procedure: COLONOSCOPY WITH PROPOFOL;  Surgeon: Lucilla Lame, MD;  Location: [facility] ENDOSCOPY;  Service: Endoscopy;  Laterality: N/A;  . CORONARY ANGIOPLASTY WITH STENT PLACEMENT  09/2009   LAD 3.0 X23 mm Xience DES, RCA: 4.0 X 15 mm Xience DES  . ELECTROMAGNETIC NAVIGATION  BROCHOSCOPY N/A 10/27/2017   Procedure: ELECTROMAGNETIC NAVIGATION BRONCHOSCOPY;  Surgeon: Flora Lipps, MD;  Location: [facility] ORS;  Service: Cardiopulmonary;  Laterality: N/A;  . ESOPHAGEAL DILATION    . ESOPHAGOGASTRODUODENOSCOPY  2008  . ESOPHAGOGASTRODUODENOSCOPY  08/20/2012  . HERNIA REPAIR  07/20/2012   L inguinal hernia repair  . PENILE PROSTHESIS IMPLANT      Prior to Admission medications   Medication Sig Start Date End Date Taking? Authorizing Provider  acetaminophen (TYLENOL) 325 MG tablet Take 2 tablets (650 mg total) by mouth every 6 (six) hours as needed for mild pain (or Fever >/= 101). 08/26/18  Yes Gouru, Aruna, MD  albuterol (PROVENTIL HFA;VENTOLIN HFA) 108 (90 Base) MCG/ACT inhaler Inhale 2 puffs into the lungs every 6 (six) hours as needed for wheezing or shortness of breath. 11/01/18  Yes Alfred Levins, [place], MD  ARIPiprazole (ABILIFY) 5 MG tablet Take 5 mg by mouth daily. 07/26/18  Yes [provider]  aspirin 81 MG tablet Take 81 mg by mouth daily.     Yes [provider]  atorvastatin (LIPITOR) 40 MG tablet Take 1 tablet (40 mg total) by mouth daily. 03/03/18  Yes [name], MD  DULoxetine (CYMBALTA) 30 MG capsule duloxetine 30 mg capsule,delayed release   Yes [provider]  feeding supplement, ENSURE ENLIVE, (ENSURE ENLIVE) LIQD Take 237 mLs by mouth 3 (three) times daily between meals. 10/01/18  Yes Epifanio Lesches, MD  Fluticasone-Salmeterol (ADVAIR) 500-50 MCG/DOSE AEPB Inhale 1 puff into the lungs every 12 (twelve) hours. 11/29/18  Yes [provider]  furosemide (LASIX) 20 MG tablet Take 1 tablet (20 mg total) by mouth daily. 07/07/18 09/30/19 Yes [name], MD  hydrOXYzine (VISTARIL) 25 MG capsule Take 25 mg by mouth 2 (two) times daily as needed. 06/05/18  Yes [provider]  ipratropium-albuterol (DUONEB) 0.5-2.5 (3) MG/3ML SOLN Take 3 mLs by nebulization 4 (four) times daily. DX:J44.9 12/04/18  Yes Wilhelmina Mcardle, MD  LORazepam (ATIVAN) 1 MG tablet Take 1 tablet (1 mg total) by mouth every 8 (eight) hours as needed for anxiety. 07/07/18  Yes [name], MD  losartan (COZAAR) 25 MG tablet TAKE 1 TABLET DAILY 01/27/18  Yes Gollan, Kathlene November, MD  mirtazapine (REMERON) 30 MG tablet Take 1 tablet (30 mg total) by mouth at bedtime. 06/24/18  Yes [name], MD  Multiple Vitamin (MULTIVITAMIN) capsule Take 1 capsule by mouth daily.   Yes [provider]  nitroGLYCERIN (NITROSTAT) 0.4 MG SL tablet Place 1 tablet (0.4 mg total) under the tongue every 5 (five) minutes as needed. 10/17/17  Yes Wellington Hampshire, MD  sertraline (ZOLOFT) 100 MG tablet Take 100 mg by mouth daily.  06/28/18  Yes [provider]  sucralfate (CARAFATE) 1 GM/10ML suspension Take 10 mLs (1 g total) by mouth 4 (four) times daily -  with meals and at bedtime. Patient taking differently:  Take 1 g by mouth 4 (four) times daily as needed.  05/16/18  Yes [name], MD  tiotropium (SPIRIVA) 18 MCG inhalation capsule Place 18 mcg into inhaler and inhale daily. 11/29/18  Yes [provider]  budesonide (PULMICORT) 0.25 MG/2ML nebulizer solution Take 2 mLs (0.25 mg total) by nebulization 2 (two) times daily. Patient not taking: Reported on 12/16/2018 12/04/18 12/04/19  Wilhelmina Mcardle, MD  doxycycline (VIBRAMYCIN) 100 MG capsule Take 1 capsule (100 mg total) by mouth 2 (two) times daily for 7 days. 12/16/18 12/23/18  Rudene Re, MD  predniSONE (DELTASONE) 20 MG tablet Take 3 tablets (60 mg total) by mouth daily for 4 days. 12/16/18 12/20/18  Rudene Re, MD    Allergies Prozac [fluoxetine hcl]; Effexor xr [venlafaxine hcl er]; and Wellbutrin [bupropion]  Family History  Problem Relation Age of Onset  . Heart attack Brother        Brother #1  . Diabetes Brother   . Hypertension Brother        #3  . Coronary artery disease Father 67       deceased  . Heart attack Father   . Diabetes  Father   . Heart disease Father   . COPD Mother 27       deceased  . Alcohol abuse Sister        polysubstance abuse  . COPD Sister   . Lung cancer Sister   . Alcohol abuse Sister        polysubstance abuse  . Penile cancer Brother   . Diabetes Brother     Social History Social History   Tobacco Use  . Smoking status: Current Every Day Smoker    Packs/day: 1.00    Years: 42.00    Pack years: 42.00    Types: Cigarettes  . Smokeless tobacco: Never Used  Substance Use Topics  . Alcohol use: No    Alcohol/week: 0.0 standard drinks  . Drug use: No    Review of Systems  Constitutional: Negative for fever. Eyes: Negative for visual changes. ENT: Negative for sore throat. Neck: No neck pain  Cardiovascular: Negative for chest pain. Respiratory: + shortness of breath, wheezing, cough Gastrointestinal: Negative for abdominal pain, vomiting or diarrhea. Genitourinary: Negative for dysuria. Musculoskeletal: Negative for back pain. Skin: Negative for rash. Neurological: Negative for headaches, weakness or numbness. Psych: No SI or HI  ____________________________________________   PHYSICAL EXAM:  VITAL SIGNS: ED Triage Vitals  Enc Vitals Group     BP 12/16/18 1726 (!) 91/53     Pulse Rate 12/16/18 1726 97     Resp 12/16/18 1726 19     Temp --      Temp src --      SpO2 12/16/18 1726 91 %     Weight 12/16/18 1722 139 lb 15.9 oz (63.5 kg)     Height 12/16/18 1722 5\' 10"  (1.778 m)     Head Circumference --      Peak Flow --      Pain Score 12/16/18 1721 0     Pain Loc --      Pain Edu? --      Excl. in [place]? --     Constitutional: Alert and oriented, chronically ill appearing in mild respiratory distress.  HEENT:      Head: Normocephalic and atraumatic.         Eyes: Conjunctivae are normal. Sclera is non-icteric.       Mouth/Throat: Mucous membranes are  moist.       Neck: Supple with no signs of meningismus. Cardiovascular: regular rate and rhythm. No  murmurs, gallops, or rubs. 2+ symmetrical distal pulses are present in all extremities. No JVD. Respiratory: Increased work of breathing, tachypneic, satting in the mid 90s on 2 L nasal cannula, severely diminished air movement bilaterally with faint expiratory wheezes throughout  Gastrointestinal: Soft, non tender, and non distended with positive bowel sounds. No rebound or guarding. Musculoskeletal: Nontender with normal range of motion in all extremities. No edema, cyanosis, or erythema of extremities. Neurologic: Normal speech and language. Face is symmetric. Moving all extremities. No gross focal neurologic deficits are appreciated. Skin: Skin is warm, dry and intact. No rash noted. Psychiatric: Mood and affect are normal. Speech and behavior are normal.  ____________________________________________   LABS (all labs ordered are listed, but only abnormal results are displayed)  Labs Reviewed  BASIC METABOLIC PANEL - Abnormal; Notable for the following components:      Result Value   Chloride 96 (*)    CO2 38 (*)    Glucose, Bld 101 (*)    Creatinine, Ser 0.58 (*)    All other components within normal limits  CBC - Abnormal; Notable for the following components:   Hemoglobin 12.4 (*)    All other components within normal limits  TROPONIN I  INFLUENZA PANEL BY PCR (TYPE A & B)  TROPONIN I   ____________________________________________  EKG  ED ECG REPORT I, Rudene Re, the attending physician, personally viewed and interpreted this ECG.  Normal sinus rhythm, rate of 88, normal intervals, left axis deviation, LAFB, occasional PVCs, T wave inversions in 1 and aVL, no ST elevations or depressions.  Unchanged from prior. ____________________________________________  RADIOLOGY  I have personally reviewed the images performed during this visit and I agree with the Radiologist's read.   Interpretation by Radiologist:  Dg Chest 2 View  Result Date: 12/16/2018 CLINICAL  DATA:  Acute on chronic shortness of breath. EXAM: CHEST - 2 VIEW COMPARISON:  Chest x-ray dated 11/20/2018 FINDINGS: The lungs are hyperinflated with flattening of the diaphragm consistent with COPD. No acute infiltrates or effusions. Heart size and vascularity are normal. No acute bone abnormality. IMPRESSION: No acute cardiopulmonary abnormality. COPD. Electronically Signed   By: Lorriane Shire M.D.   On: 12/16/2018 19:01      ____________________________________________   PROCEDURES  Procedure(s) performed: None Procedures Critical Care performed:  None ____________________________________________   INITIAL IMPRESSION / ASSESSMENT AND PLAN / ED COURSE  67 y.o. male with a history of COPD on 2 L nasal cannula at bedtime, smoking, CAD, hypertension, hyperlipidemia who presents for evaluation of shortness of breath, cough and wheezing.  Patient arrives in mild respiratory distress, satting in the mid 90s on his baseline 2 L nasal cannula, severely diminished air movement bilaterally with faint expiratory wheezes.  Patient slightly hypotensive and looks dry on exam.  Will start patient on duo nebs.  He received Solu-Medrol per EMS.  Chest x-ray to rule out pneumonia versus pulmonary edema.  Will check patient for flu.  EKG with no evidence of ischemia    _________________________ 8:34 PM on 12/16/2018 -----------------------------------------  Patient feels markedly improved. Remains with normal WOB and normal sats on baseline 2L .  Chest x-ray negative for pneumonia, pulmonary edema, pneumothorax.  Flu is negative.  EKG with no evidence of ischemia.  Second troponin is pending.  Plan to discharge home on doxycycline, steroids, and albuterol for bronchitis.  Care transferred to  Dr. Archie Balboa.   As part of my medical decision making, I reviewed the following data within the [facility] notes reviewed and incorporated, Labs reviewed , EKG interpreted , Old EKG  reviewed, Old chart reviewed, Radiograph reviewed , Notes from prior ED visits and [place] Controlled Substance Database    Pertinent labs & imaging results that were available during my care of the patient were reviewed by me and considered in my medical decision making (see chart for details).    ____________________________________________   FINAL CLINICAL IMPRESSION(S) / ED DIAGNOSES  Final diagnoses:  Bronchitis      NEW MEDICATIONS STARTED DURING THIS VISIT:  ED Discharge Orders         Ordered    predniSONE (DELTASONE) 20 MG tablet  Daily     12/16/18 1955    doxycycline (VIBRAMYCIN) 100 MG capsule  2 times daily     12/16/18 1955           Note:  This document was prepared using Dragon voice recognition software and may include unintentional dictation errors.    Rudene Re, MD 12/16/18 2035

## 2018-12-16 NOTE — ED Triage Notes (Signed)
Pt to ED via EMS from home. Per ems pt has had difficulty breathing xfew days. Pt takes dunoebs at home but states they havent been working. Pt I son oxygen at night but states for the past few days has been wearing 3L all day. Pt has hx COPD. EMS gave 2 duonebs and 125 solumedrol. Pt arrives 91 on room air. Pt ambulated to toilet independently.

## 2018-12-19 ENCOUNTER — Telehealth: Payer: Self-pay | Admitting: Pulmonary Disease

## 2018-12-19 NOTE — Telephone Encounter (Signed)
Called patient and went over plan per last office visit. He is to continue neb therapy and d/c Tudorza. No further question.

## 2018-12-22 ENCOUNTER — Other Ambulatory Visit: Payer: Self-pay | Admitting: *Deleted

## 2018-12-22 MED ORDER — BUDESONIDE 0.25 MG/2ML IN SUSP: 0.2500 mg | Freq: Two times a day (BID) | RESPIRATORY_TRACT | 10 refills | Status: DC

## 2018-12-22 NOTE — Telephone Encounter (Signed)
Pt requested  Rx be sent [facility]. Pt states he feel bad. After review of neb therapy, patient has been taking medications incorrectly. He has been taking Duoneb 2 vials 4 times daily which is incorrect. Advised patient Duoneb 1 vial 4 times per day. We are still working on getting Budesonide. He had still been using [place]. Pt advised on 12/31 to D/C Tudorza. Pt reminded that he should not still be using [place]. Nothing further needed.

## 2018-12-22 NOTE — Telephone Encounter (Signed)
PA attempted via covermymeds Denied Budesonide rx. [facility] can't bill to [facility]. [facility] order to [place]. LM for patient to call back.

## 2018-12-22 NOTE — Telephone Encounter (Signed)
Patient returning call.

## 2018-12-25 ENCOUNTER — Telehealth: Payer: Self-pay

## 2018-12-25 ENCOUNTER — Other Ambulatory Visit: Payer: Self-pay

## 2018-12-25 MED ORDER — BUDESONIDE 0.25 MG/2ML IN SUSP: 0.2500 mg | Freq: Two times a day (BID) | RESPIRATORY_TRACT | 10 refills | Status: DC

## 2018-12-25 NOTE — Telephone Encounter (Signed)
Spoke to patient, Budesonide rx was only for 15 days. Changed to 30. Patient aware. Nothing further needed at this time.

## 2018-12-25 NOTE — Telephone Encounter (Signed)
Patient has questions about these meds .  Please call.

## 2018-12-31 ENCOUNTER — Other Ambulatory Visit: Payer: Self-pay

## 2018-12-31 ENCOUNTER — Inpatient Hospital Stay
Admission: EM | Admit: 2018-12-31 | Discharge: 2019-01-02 | DRG: 190 | Disposition: A | Discharge status: Home Health | Payer: Medicare Other | Attending: Specialist | Admitting: Specialist

## 2018-12-31 ENCOUNTER — Emergency Department: Payer: Medicare Other

## 2018-12-31 DIAGNOSIS — F329 Major depressive disorder, single episode, unspecified: Secondary | ICD-10-CM | POA: Diagnosis present

## 2018-12-31 DIAGNOSIS — E785 Hyperlipidemia, unspecified: Secondary | ICD-10-CM | POA: Diagnosis present

## 2018-12-31 DIAGNOSIS — I252 Old myocardial infarction: Secondary | ICD-10-CM | POA: Diagnosis not present

## 2018-12-31 DIAGNOSIS — Z955 Presence of coronary angioplasty implant and graft: Secondary | ICD-10-CM | POA: Diagnosis not present

## 2018-12-31 DIAGNOSIS — I255 Ischemic cardiomyopathy: Secondary | ICD-10-CM | POA: Diagnosis present

## 2018-12-31 DIAGNOSIS — R0902 Hypoxemia: Secondary | ICD-10-CM

## 2018-12-31 DIAGNOSIS — I10 Essential (primary) hypertension: Secondary | ICD-10-CM | POA: Diagnosis present

## 2018-12-31 DIAGNOSIS — J441 Chronic obstructive pulmonary disease with (acute) exacerbation: Secondary | ICD-10-CM | POA: Diagnosis present

## 2018-12-31 DIAGNOSIS — Z888 Allergy status to other drugs, medicaments and biological substances status: Secondary | ICD-10-CM | POA: Diagnosis not present

## 2018-12-31 DIAGNOSIS — F419 Anxiety disorder, unspecified: Secondary | ICD-10-CM | POA: Diagnosis present

## 2018-12-31 DIAGNOSIS — I2510 Atherosclerotic heart disease of native coronary artery without angina pectoris: Secondary | ICD-10-CM | POA: Diagnosis present

## 2018-12-31 DIAGNOSIS — J9621 Acute and chronic respiratory failure with hypoxia: Secondary | ICD-10-CM | POA: Diagnosis present

## 2018-12-31 DIAGNOSIS — Z79899 Other long term (current) drug therapy: Secondary | ICD-10-CM

## 2018-12-31 DIAGNOSIS — K219 Gastro-esophageal reflux disease without esophagitis: Secondary | ICD-10-CM | POA: Diagnosis present

## 2018-12-31 DIAGNOSIS — Z7951 Long term (current) use of inhaled steroids: Secondary | ICD-10-CM | POA: Diagnosis not present

## 2018-12-31 DIAGNOSIS — F17210 Nicotine dependence, cigarettes, uncomplicated: Secondary | ICD-10-CM | POA: Diagnosis present

## 2018-12-31 LAB — CBC WITH DIFFERENTIAL/PLATELET
Abs Immature Granulocytes: 0.03 10*3/uL (ref 0.00–0.07)
BASOS PCT: 0 %
Basophils Absolute: 0 10*3/uL (ref 0.0–0.1)
EOS ABS: 0.1 10*3/uL (ref 0.0–0.5)
Eosinophils Relative: 2 %
HCT: 36.6 % — ABNORMAL LOW (ref 39.0–52.0)
Hemoglobin: 11.3 g/dL — ABNORMAL LOW (ref 13.0–17.0)
Immature Granulocytes: 0 %
Lymphocytes Relative: 22 %
Lymphs Abs: 1.7 10*3/uL (ref 0.7–4.0)
MCH: 29.4 pg (ref 26.0–34.0)
MCHC: 30.9 g/dL (ref 30.0–36.0)
MCV: 95.3 fL (ref 80.0–100.0)
Monocytes Absolute: 1.2 10*3/uL — ABNORMAL HIGH (ref 0.1–1.0)
Monocytes Relative: 15 %
Neutro Abs: 4.7 10*3/uL (ref 1.7–7.7)
Neutrophils Relative %: 61 %
Platelets: 229 10*3/uL (ref 150–400)
RBC: 3.84 MIL/uL — ABNORMAL LOW (ref 4.22–5.81)
RDW: 12.2 % (ref 11.5–15.5)
WBC: 7.7 10*3/uL (ref 4.0–10.5)
nRBC: 0 % (ref 0.0–0.2)

## 2018-12-31 LAB — COMPREHENSIVE METABOLIC PANEL
ALT: 20 U/L (ref 0–44)
AST: 24 U/L (ref 15–41)
Albumin: 3.6 g/dL (ref 3.5–5.0)
Alkaline Phosphatase: 62 U/L (ref 38–126)
Anion gap: 6 (ref 5–15)
BUN: 8 mg/dL (ref 8–23)
CO2: 42 mmol/L — ABNORMAL HIGH (ref 22–32)
Calcium: 8.9 mg/dL (ref 8.9–10.3)
Chloride: 91 mmol/L — ABNORMAL LOW (ref 98–111)
Creatinine, Ser: 0.63 mg/dL (ref 0.61–1.24)
GFR calc non Af Amer: >60 mL/min (ref >60)
Glucose, Bld: 123 mg/dL — ABNORMAL HIGH (ref 70–99)
Potassium: 3.9 mmol/L (ref 3.5–5.1)
Sodium: 139 mmol/L (ref 135–145)
Total Bilirubin: 0.5 mg/dL (ref 0.3–1.2)
Total Protein: 6.1 g/dL — ABNORMAL LOW (ref 6.5–8.1)

## 2018-12-31 LAB — TROPONIN I: Troponin I: <0.03 ng/mL (ref <0.03)

## 2018-12-31 LAB — BRAIN NATRIURETIC PEPTIDE: B NATRIURETIC PEPTIDE 5: 31 pg/mL (ref 0.0–100.0)

## 2018-12-31 IMAGING — CR DG CHEST 2V
2 series · 2 of 2 positions shown · non-contrast
Comparison: Radiographs [DATE] and [DATE]. Chest CT
[DATE].

CLINICAL DATA: Shortness of breath with productive cough for 4
days. Smoker.

EXAM:
CHEST - 2 VIEW

[chest pa]
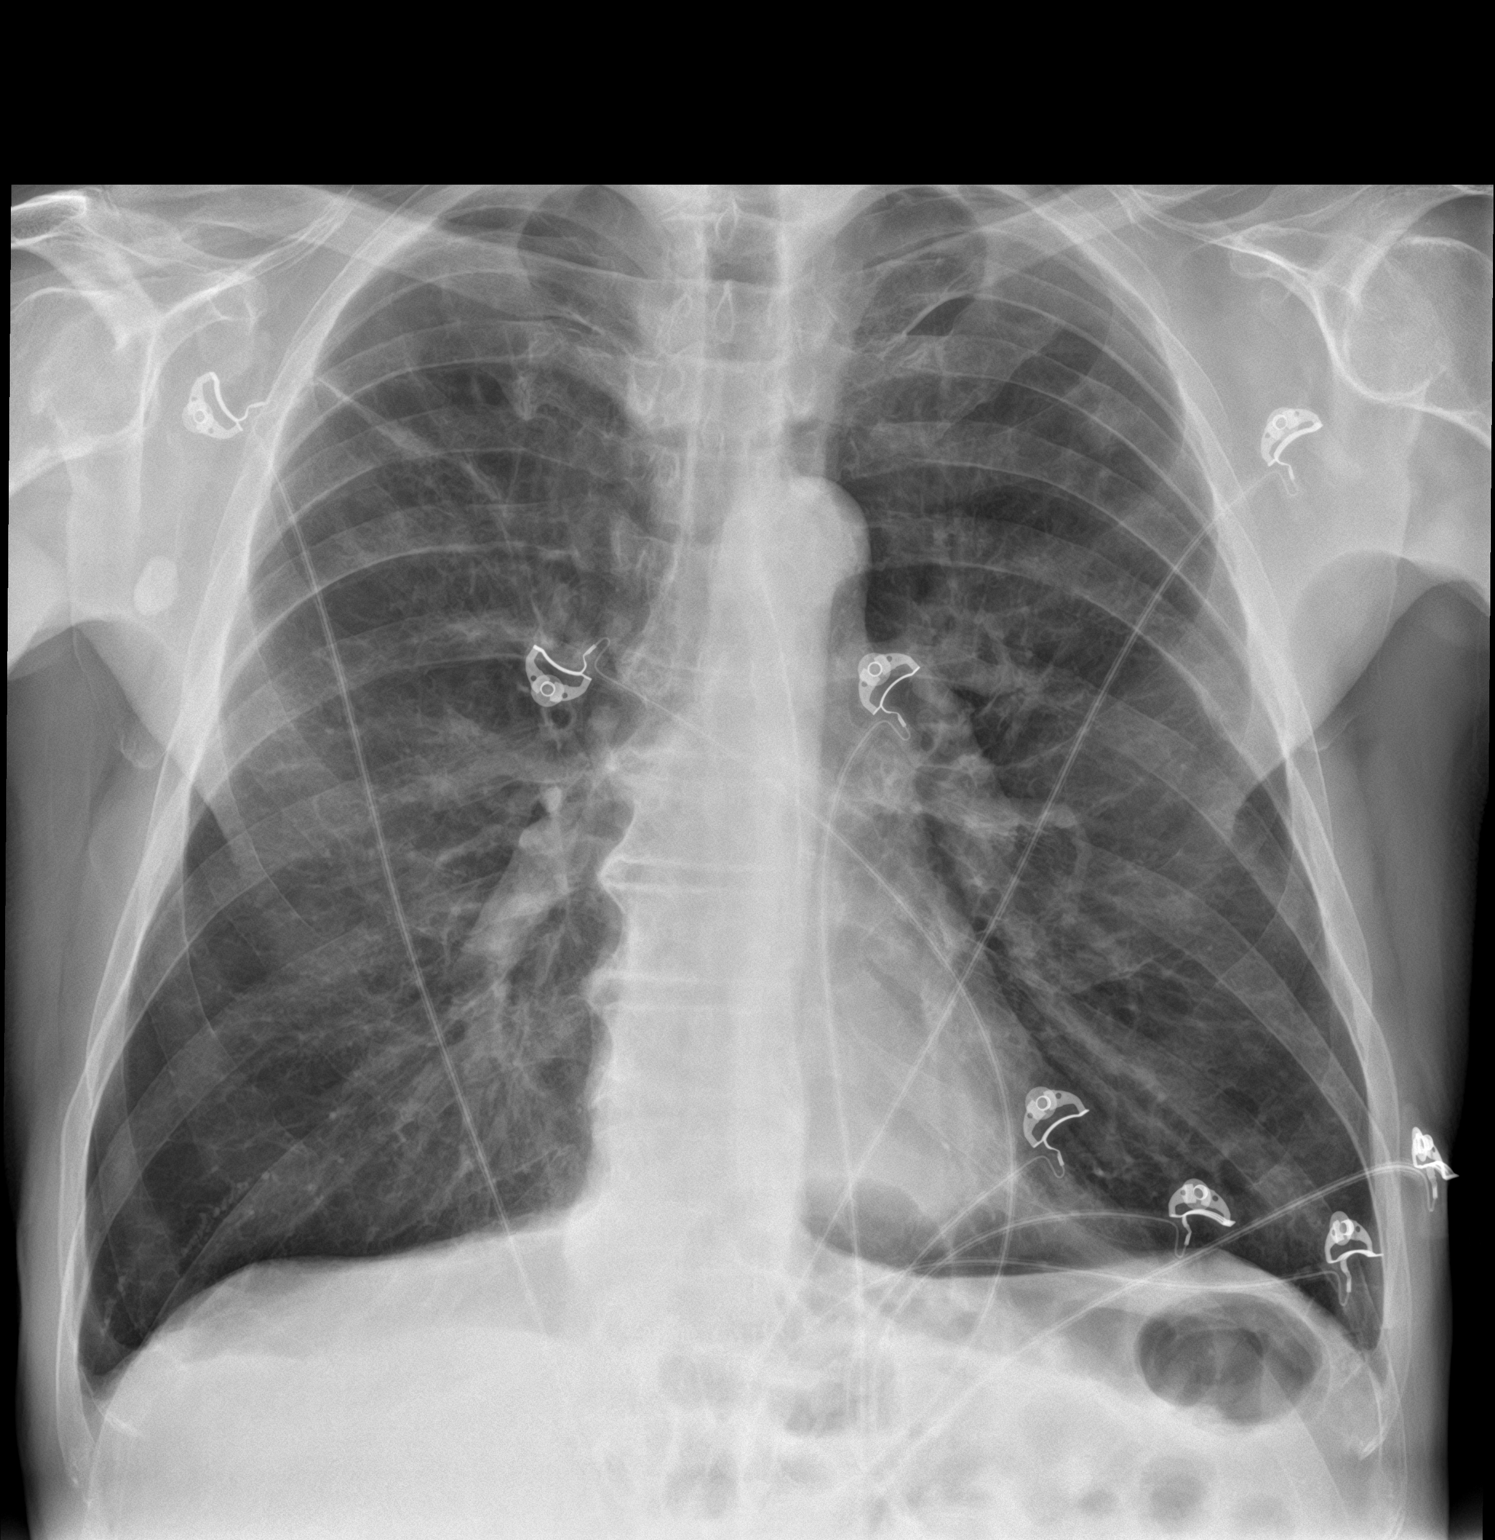

[chest lat]
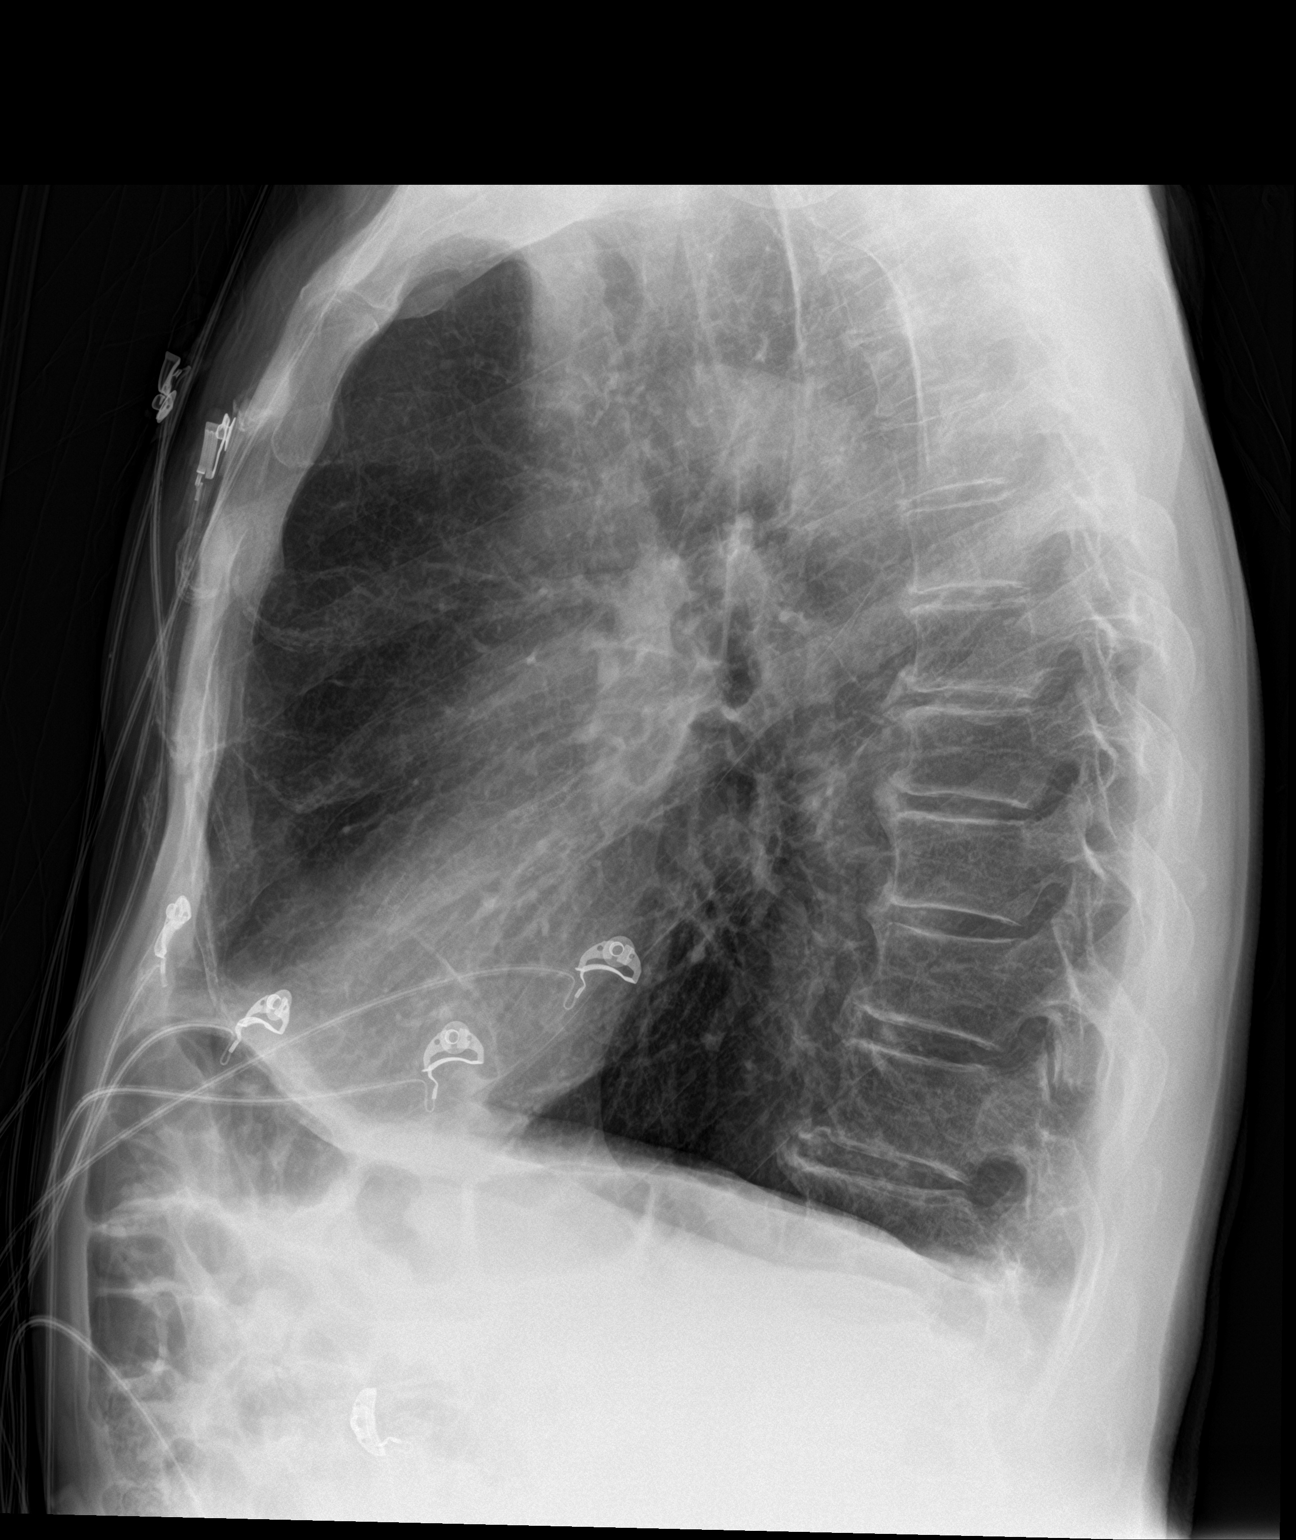

[2 of 2 positions shown; findings below may reference images not displayed]

FINDINGS: The heart size and mediastinal contours are stable. There is aortic
atherosclerosis. There is chronic lung disease with hyperinflation,
central airway thickening and emphysema. Biapical scarring is
grossly stable. There is no pleural effusion or pneumothorax. Old
rib fractures on the left and soft tissue calcification anterior to
the right scapula are noted.
IMPRESSION: No acute findings.  Radiographically stable appearance of the chest.

## 2018-12-31 MED ORDER — LORAZEPAM 1 MG PO TABS
1.0000 mg | ORAL_TABLET | Freq: Three times a day (TID) | ORAL | Status: DC | PRN
  Administered 2018-12-31 – 2019-01-02 (×4): 1 mg via ORAL
  Filled 2018-12-31 (×4): qty 1

## 2018-12-31 MED ORDER — ARIPIPRAZOLE 5 MG PO TABS: 5.0000 mg | ORAL_TABLET | Freq: Every day | ORAL | Status: DC

## 2018-12-31 MED ORDER — HYDROXYZINE PAMOATE 25 MG PO CAPS: 25.0000 mg | ORAL_CAPSULE | Freq: Two times a day (BID) | ORAL | Status: DC | PRN

## 2018-12-31 MED ORDER — ENOXAPARIN SODIUM 40 MG/0.4ML ~~LOC~~ SOLN
40.0000 mg | SUBCUTANEOUS | Status: DC
  Administered 2018-12-31 – 2019-01-01 (×2): 40 mg via SUBCUTANEOUS
  Filled 2018-12-31 (×2): qty 0.4

## 2018-12-31 MED ORDER — SERTRALINE HCL 50 MG PO TABS
100.0000 mg | ORAL_TABLET | Freq: Every day | ORAL | Status: DC
  Administered 2019-01-01 – 2019-01-02 (×2): 100 mg via ORAL
  Filled 2018-12-31 (×2): qty 2

## 2018-12-31 MED ORDER — NICOTINE 21 MG/24HR TD PT24
21.0000 mg | MEDICATED_PATCH | Freq: Every day | TRANSDERMAL | Status: DC
  Administered 2019-01-01 – 2019-01-02 (×2): 21 mg via TRANSDERMAL
  Filled 2018-12-31 (×2): qty 1

## 2018-12-31 MED ORDER — ACETAMINOPHEN 650 MG RE SUPP: 650.0000 mg | Freq: Four times a day (QID) | RECTAL | Status: DC | PRN

## 2018-12-31 MED ORDER — LOSARTAN POTASSIUM 25 MG PO TABS
25.0000 mg | ORAL_TABLET | Freq: Every day | ORAL | Status: DC
  Administered 2019-01-01 – 2019-01-02 (×2): 25 mg via ORAL
  Filled 2018-12-31 (×2): qty 1

## 2018-12-31 MED ORDER — HYDROXYZINE HCL 25 MG PO TABS
25.0000 mg | ORAL_TABLET | Freq: Two times a day (BID) | ORAL | Status: DC | PRN
  Filled 2018-12-31: qty 1

## 2018-12-31 MED ORDER — SODIUM CHLORIDE 0.9% FLUSH: 3.0000 mL | INTRAVENOUS | Status: DC | PRN

## 2018-12-31 MED ORDER — SODIUM CHLORIDE 0.9 % IV SOLN: 250.0000 mL | INTRAVENOUS | Status: DC | PRN

## 2018-12-31 MED ORDER — TIOTROPIUM BROMIDE MONOHYDRATE 18 MCG IN CAPS
18.0000 ug | ORAL_CAPSULE | Freq: Every day | RESPIRATORY_TRACT | Status: DC
  Administered 2019-01-01: 09:00:00 18 ug via RESPIRATORY_TRACT
  Filled 2018-12-31: qty 5

## 2018-12-31 MED ORDER — BUDESONIDE 0.25 MG/2ML IN SUSP: 0.2500 mg | Freq: Two times a day (BID) | RESPIRATORY_TRACT | Status: DC

## 2018-12-31 MED ORDER — METHYLPREDNISOLONE SODIUM SUCC 125 MG IJ SOLR
60.0000 mg | Freq: Four times a day (QID) | INTRAMUSCULAR | Status: DC
  Administered 2018-12-31 – 2019-01-02 (×7): 60 mg via INTRAVENOUS
  Filled 2018-12-31 (×7): qty 2

## 2018-12-31 MED ORDER — NITROGLYCERIN 0.4 MG SL SUBL: 0.4000 mg | SUBLINGUAL_TABLET | SUBLINGUAL | Status: DC | PRN

## 2018-12-31 MED ORDER — ONDANSETRON HCL 4 MG PO TABS: 4.0000 mg | ORAL_TABLET | Freq: Four times a day (QID) | ORAL | Status: DC | PRN

## 2018-12-31 MED ORDER — GUAIFENESIN-DM 100-10 MG/5ML PO SYRP
5.0000 mL | ORAL_SOLUTION | ORAL | Status: DC | PRN
  Administered 2019-01-01: 09:00:00 5 mL via ORAL
  Filled 2018-12-31 (×2): qty 5

## 2018-12-31 MED ORDER — ACETAMINOPHEN 325 MG PO TABS: 650.0000 mg | ORAL_TABLET | Freq: Four times a day (QID) | ORAL | Status: DC | PRN

## 2018-12-31 MED ORDER — SODIUM CHLORIDE 0.9% FLUSH
3.0000 mL | Freq: Two times a day (BID) | INTRAVENOUS | Status: DC
  Administered 2018-12-31 – 2019-01-02 (×4): 3 mL via INTRAVENOUS

## 2018-12-31 MED ORDER — ONDANSETRON HCL 4 MG/2ML IJ SOLN: 4.0000 mg | Freq: Four times a day (QID) | INTRAMUSCULAR | Status: DC | PRN

## 2018-12-31 MED ORDER — BUDESONIDE 0.25 MG/2ML IN SUSP
0.2500 mg | Freq: Two times a day (BID) | RESPIRATORY_TRACT | Status: DC
  Administered 2018-12-31 – 2019-01-02 (×4): 0.25 mg via RESPIRATORY_TRACT
  Filled 2018-12-31 (×4): qty 2

## 2018-12-31 MED ORDER — ORAL CARE MOUTH RINSE
15.0000 mL | Freq: Two times a day (BID) | OROMUCOSAL | Status: DC
  Administered 2019-01-01 – 2019-01-02 (×3): 15 mL via OROMUCOSAL

## 2018-12-31 MED ORDER — ASPIRIN EC 81 MG PO TBEC
81.0000 mg | DELAYED_RELEASE_TABLET | Freq: Every day | ORAL | Status: DC
  Administered 2019-01-01 – 2019-01-02 (×2): 81 mg via ORAL
  Filled 2018-12-31 (×2): qty 1

## 2018-12-31 MED ORDER — IPRATROPIUM-ALBUTEROL 0.5-2.5 (3) MG/3ML IN SOLN
3.0000 mL | Freq: Once | RESPIRATORY_TRACT | Status: AC
  Administered 2018-12-31: 3 mL via RESPIRATORY_TRACT

## 2018-12-31 MED ORDER — GUAIFENESIN ER 600 MG PO TB12
600.0000 mg | ORAL_TABLET | Freq: Two times a day (BID) | ORAL | Status: DC
  Administered 2018-12-31 – 2019-01-02 (×4): 600 mg via ORAL
  Filled 2018-12-31 (×4): qty 1

## 2018-12-31 MED ORDER — IPRATROPIUM-ALBUTEROL 0.5-2.5 (3) MG/3ML IN SOLN
RESPIRATORY_TRACT | Status: AC
  Administered 2018-12-31: 3 mL via RESPIRATORY_TRACT
  Filled 2018-12-31: qty 3

## 2018-12-31 MED ORDER — DOXYCYCLINE HYCLATE 100 MG PO TABS
100.0000 mg | ORAL_TABLET | Freq: Two times a day (BID) | ORAL | Status: DC
  Administered 2018-12-31 – 2019-01-02 (×4): 100 mg via ORAL
  Filled 2018-12-31 (×5): qty 1

## 2018-12-31 MED ORDER — IPRATROPIUM-ALBUTEROL 0.5-2.5 (3) MG/3ML IN SOLN
3.0000 mL | RESPIRATORY_TRACT | Status: DC
  Administered 2018-12-31 – 2019-01-02 (×11): 3 mL via RESPIRATORY_TRACT
  Filled 2018-12-31 (×11): qty 3

## 2018-12-31 MED ORDER — MIRTAZAPINE 15 MG PO TABS
30.0000 mg | ORAL_TABLET | Freq: Every day | ORAL | Status: DC
  Administered 2018-12-31 – 2019-01-01 (×2): 30 mg via ORAL
  Filled 2018-12-31 (×2): qty 2

## 2018-12-31 MED ORDER — ARIPIPRAZOLE 5 MG PO TABS
5.0000 mg | ORAL_TABLET | Freq: Every day | ORAL | Status: DC
  Administered 2019-01-01 – 2019-01-02 (×2): 5 mg via ORAL
  Filled 2018-12-31 (×2): qty 1

## 2018-12-31 MED ORDER — DULOXETINE HCL 30 MG PO CPEP: 30.0000 mg | ORAL_CAPSULE | Freq: Every day | ORAL | Status: DC

## 2018-12-31 MED ORDER — HYDROCODONE-ACETAMINOPHEN 5-325 MG PO TABS: 1.0000 | ORAL_TABLET | ORAL | Status: DC | PRN

## 2018-12-31 MED ORDER — ATORVASTATIN CALCIUM 20 MG PO TABS
40.0000 mg | ORAL_TABLET | Freq: Every day | ORAL | Status: DC
  Administered 2019-01-01 – 2019-01-02 (×2): 40 mg via ORAL
  Filled 2018-12-31 (×2): qty 2

## 2018-12-31 MED ORDER — FUROSEMIDE 20 MG PO TABS
20.0000 mg | ORAL_TABLET | Freq: Every day | ORAL | Status: DC
  Administered 2019-01-01 – 2019-01-02 (×2): 20 mg via ORAL
  Filled 2018-12-31 (×2): qty 1

## 2018-12-31 MED ORDER — DULOXETINE HCL 30 MG PO CPEP
30.0000 mg | ORAL_CAPSULE | Freq: Every day | ORAL | Status: DC
  Administered 2019-01-01 – 2019-01-02 (×2): 30 mg via ORAL
  Filled 2018-12-31 (×2): qty 1

## 2018-12-31 NOTE — ED Provider Notes (Signed)
[facility] Emergency Department Provider Note   ____________________________________________   First MD Initiated Contact with Patient 12/31/18 1616     (approximate)  I have reviewed the triage vital signs and the nursing notes.   HISTORY  Chief Complaint Shortness of Breath    HPI Leslie Sims is a 68 y.o. male patient with history of COPD who recently had his medicines changed shortly thereafter he began having increasing shortness of breath.  This thing shortness of breath is been gradually worsening for 4 days.  He has a cough productive of small amounts of yellow phlegm but not more than usual.  He is usually on at least 2-1/2 L of oxygen at home.  He came in today because the shortness of breath got too bad for him to t tolerate it anymore at home.  Past Medical History:  Diagnosis Date  . Allergic rhinitis   . Anxiety   . Back pain   . Colon polyps 08/2012   colonoscopy; multiple colon polyps; repeat colonoscopy in one year.  [name] COPD (chronic obstructive pulmonary disease) ([place])   . Coronary artery disease 11/2009   a. late presenting ant MI; b. LHC 100% pLAD s/p PCI/DES, 99% mRCA s/p PCI/DES, EF 35%; c. nuclear stress test 05/13: prior ant/inf infarcts w/o ischemia, EF 43%; d. LHC 02/14: widely patent stents with no other obs dz, EF 40%; e. LHC 11/17: LM nl, mLAD 10%, patent LAD stent, dLAD 20%, p-mRCA 10%, mRCA 40%, patent RCA stent, EF 35-45%   . Depression   . GERD (gastroesophageal reflux disease)   . Headache(784.0)   . Helicobacter pylori (H. pylori)   . Hemorrhoids   . Hernia    inguinal  . Hyperlipidemia   . Hypertension   . Insomnia   . Ischemic cardiomyopathy   . Myalgia   . Peptic ulcer   . Pulmonary nodules   . ST elevation (STEMI) myocardial infarction involving left anterior descending coronary artery ([place]) 11/2009   a. s/p PCI to the LAD  . Status post dilation of esophageal narrowing 2000  . Tinea pedis   .  Wears dentures    partial upper    Patient Active Problem List   Diagnosis Date Noted  . Acute respiratory failure with hypoxia ([place]) 09/29/2018  . Acute respiratory failure with hypoxia and hypercapnia ([place]) 09/13/2018  . Acute on chronic respiratory failure with hypoxia and hypercapnia ([place]) 08/24/2018  . Chest pain 07/02/2018  . Grief reaction 01/13/2018  . COPD with hypoxia ([place]) 12/25/2017  . Unstable angina ([place])   . Benign neoplasm of ascending colon   . Benign neoplasm of descending colon   . Benign neoplasm of sigmoid colon   . Personal history of colonic polyps   . Pulmonary nodules/lesions, multiple 01/05/2015  . Bruit of left carotid artery 11/04/2014  . Sleep disorder 07/18/2013  . Seasonal and perennial allergic rhinitis 05/29/2013  . Bradycardia 04/20/2011  . SMOKER 07/30/2010  . CAD, NATIVE VESSEL 04/23/2010  . Hyperlipidemia 03/24/2010  . DEPRESSION/ANXIETY 03/24/2010  . Chronic systolic heart failure ([place]) 03/24/2010  . COPD, very severe ([place]) 03/24/2010  . GERD 03/24/2010  . Essential hypertension 11/21/2009    Past Surgical History:  Procedure Laterality Date  . Admission  12/20/2012   COPD exacerbation.  [place].  [name] CARDIAC CATHETERIZATION    . CARDIAC CATHETERIZATION  02/05/13   [facility]  . CARDIAC CATHETERIZATION  10/14   [facility] : patent stents with no change in anatomy.  EF: 40$  . CARDIAC CATHETERIZATION Left 11/12/2016   Procedure: Left Heart Cath and Coronary Angiography;  Surgeon: Wellington Hampshire, MD;  Location: [facility];  Service: Cardiovascular;  Laterality: Left;  . COLONOSCOPY    . COLONOSCOPY WITH PROPOFOL N/A 05/18/2016   Procedure: COLONOSCOPY WITH PROPOFOL;  Surgeon: Lucilla Lame, MD;  Location: [facility] ENDOSCOPY;  Service: Endoscopy;  Laterality: N/A;  . CORONARY ANGIOPLASTY WITH STENT PLACEMENT  09/2009   LAD 3.0 X23 mm Xience DES, RCA: 4.0 X 15 mm Xience DES  . ELECTROMAGNETIC NAVIGATION BROCHOSCOPY N/A 10/27/2017   Procedure:  ELECTROMAGNETIC NAVIGATION BRONCHOSCOPY;  Surgeon: Flora Lipps, MD;  Location: [facility] ORS;  Service: Cardiopulmonary;  Laterality: N/A;  . ESOPHAGEAL DILATION    . ESOPHAGOGASTRODUODENOSCOPY  2008  . ESOPHAGOGASTRODUODENOSCOPY  08/20/2012  . HERNIA REPAIR  07/20/2012   L inguinal hernia repair  . PENILE PROSTHESIS IMPLANT      Prior to Admission medications   Medication Sig Start Date End Date Taking? Authorizing Provider  acetaminophen (TYLENOL) 325 MG tablet Take 2 tablets (650 mg total) by mouth every 6 (six) hours as needed for mild pain (or Fever >/= 101). 08/26/18   Gouru, Aruna, MD  albuterol (PROVENTIL HFA;VENTOLIN HFA) 108 (90 Base) MCG/ACT inhaler Inhale 2 puffs into the lungs every 6 (six) hours as needed for wheezing or shortness of breath. 11/01/18   Rudene Re, MD  ARIPiprazole (ABILIFY) 5 MG tablet Take 5 mg by mouth daily. 07/26/18   [provider]  aspirin 81 MG tablet Take 81 mg by mouth daily.      [provider]  atorvastatin (LIPITOR) 40 MG tablet Take 1 tablet (40 mg total) by mouth daily. 03/03/18   [name], MD  budesonide (PULMICORT) 0.25 MG/2ML nebulizer solution Take 2 mLs (0.25 mg total) by nebulization 2 (two) times daily. 12/25/18 12/25/19  Wilhelmina Mcardle, MD  DULoxetine (CYMBALTA) 30 MG capsule duloxetine 30 mg capsule,delayed release    [provider]  feeding supplement, ENSURE ENLIVE, (ENSURE ENLIVE) LIQD Take 237 mLs by mouth 3 (three) times daily between meals. 10/01/18   Epifanio Lesches, MD  Fluticasone-Salmeterol (ADVAIR) 500-50 MCG/DOSE AEPB Inhale 1 puff into the lungs every 12 (twelve) hours. 11/29/18   [provider]  furosemide (LASIX) 20 MG tablet Take 1 tablet (20 mg total) by mouth daily. 07/07/18 09/30/19  [name], MD  hydrOXYzine (VISTARIL) 25 MG capsule Take 25 mg by mouth 2 (two) times daily as needed. 06/05/18   [provider]  ipratropium-albuterol (DUONEB) 0.5-2.5 (3) MG/3ML SOLN  Take 3 mLs by nebulization 4 (four) times daily. DX:J44.9 12/04/18   Wilhelmina Mcardle, MD  LORazepam (ATIVAN) 1 MG tablet Take 1 tablet (1 mg total) by mouth every 8 (eight) hours as needed for anxiety. 07/07/18   [name], MD  losartan (COZAAR) 25 MG tablet TAKE 1 TABLET DAILY 01/27/18   Minna Merritts, MD  mirtazapine (REMERON) 30 MG tablet Take 1 tablet (30 mg total) by mouth at bedtime. 06/24/18   [name], MD  Multiple Vitamin (MULTIVITAMIN) capsule Take 1 capsule by mouth daily.    [provider]  nitroGLYCERIN (NITROSTAT) 0.4 MG SL tablet Place 1 tablet (0.4 mg total) under the tongue every 5 (five) minutes as needed. 10/17/17   Wellington Hampshire, MD  sertraline (ZOLOFT) 100 MG tablet Take 100 mg by mouth daily.  06/28/18   [provider]  sucralfate (CARAFATE) 1 GM/10ML suspension  Take 10 mLs (1 g total) by mouth 4 (four) times daily -  with meals and at bedtime. Patient taking differently: Take 1 g by mouth 4 (four) times daily as needed.  05/16/18   [name], MD  tiotropium (SPIRIVA) 18 MCG inhalation capsule Place 18 mcg into inhaler and inhale daily. 11/29/18   [provider]    Allergies Prozac [fluoxetine hcl]; Effexor xr [venlafaxine hcl er]; and Wellbutrin [bupropion]  Family History  Problem Relation Age of Onset  . Heart attack Brother        Brother #1  . Diabetes Brother   . Hypertension Brother        #3  . Coronary artery disease Father 37       deceased  . Heart attack Father   . Diabetes Father   . Heart disease Father   . COPD Mother 66       deceased  . Alcohol abuse Sister        polysubstance abuse  . COPD Sister   . Lung cancer Sister   . Alcohol abuse Sister        polysubstance abuse  . Penile cancer Brother   . Diabetes Brother     Social History Social History   Tobacco Use  . Smoking status: Current Every Day Smoker    Packs/day: 1.00    Years: 42.00    Pack years: 42.00    Types:  Cigarettes  . Smokeless tobacco: Never Used  Substance Use Topics  . Alcohol use: No    Alcohol/week: 0.0 standard drinks  . Drug use: No    Review of Systems  Constitutional: No fever/chills Eyes: No visual changes. ENT: No sore throat. Cardiovascular: Denies chest pain. Respiratory:  shortness of breath. Gastrointestinal: No abdominal pain.  No nausea, no vomiting.  No diarrhea.  No constipation. Genitourinary: Negative for dysuria. Musculoskeletal: Negative for back pain. Skin: Negative for rash. Neurological: Negative for headaches, focal weakness   ____________________________________________   PHYSICAL EXAM:  VITAL SIGNS: ED Triage Vitals  Enc Vitals Group     BP      Pulse      Resp      Temp      Temp src      SpO2      Weight      Height      Head Circumference      Peak Flow      Pain Score      Pain Loc      Pain Edu?      Excl. in [place]?     Constitutional: Alert and oriented.  Appears chronically ill asking for oxygen Eyes: Conjunctivae are normal.  Head: Atraumatic. Nose: No congestion/rhinnorhea. Mouth/Throat: Mucous membranes are moist.  Oropharynx non-erythematous. Neck: No stridor.   Cardiovascular: Normal rate, regular rhythm. Grossly normal heart sounds.  Good peripheral circulation. Respiratory: Normal respiratory effort.  No retractions. Lungs CTAB but markedly decreased breath sounds and air movement. Gastrointestinal: Soft and nontender. No distention. No abdominal bruits. No CVA tenderness. Musculoskeletal: No lower extremity tenderness nor edema.  No joint effusions. Neurologic:  Normal speech and language. No gross focal neurologic deficits are appreciated.  Skin:  Skin is warm, dry and intact. No rash noted. Psychiatric: Mood and affect are normal. Speech and behavior are normal.  ____________________________________________   LABS (all labs ordered are listed, but only abnormal results are displayed)  Labs Reviewed    COMPREHENSIVE METABOLIC PANEL -  Abnormal; Notable for the following components:      Result Value   Chloride 91 (*)    CO2 42 (*)    Glucose, Bld 123 (*)    Total Protein 6.1 (*)    All other components within normal limits  CBC WITH DIFFERENTIAL/PLATELET - Abnormal; Notable for the following components:   RBC 3.84 (*)    Hemoglobin 11.3 (*)    HCT 36.6 (*)    Monocytes Absolute 1.2 (*)    All other components within normal limits  BRAIN NATRIURETIC PEPTIDE  TROPONIN I   ____________________________________________  EKG   ____________________________________________  RADIOLOGY  ED MD interpretation:    Official radiology report(s): Dg Chest 2 View  Result Date: 12/31/2018 CLINICAL DATA:  Shortness of breath with productive cough for 4 days. Smoker. EXAM: CHEST - 2 VIEW COMPARISON:  Radiographs 12/16/2018 and 11/20/2018. Chest CT 10/19/2018. FINDINGS: The heart size and mediastinal contours are stable. There is aortic atherosclerosis. There is chronic lung disease with hyperinflation, central airway thickening and emphysema. Biapical scarring is grossly stable. There is no pleural effusion or pneumothorax. Old rib fractures on the left and soft tissue calcification anterior to the right scapula are noted. IMPRESSION: No acute findings.  Radiographically stable appearance of the chest. Electronically Signed   By: Richardean Sale M.D.   On: 12/31/2018 17:34    ____________________________________________   PROCEDURES  Procedure(s) performed:   Procedures  Critical Care performed:   ____________________________________________   INITIAL IMPRESSION / ASSESSMENT AND PLAN / ED COURSE  After 3 nebs and EMS and 2 nebs here and Solu-Medrol IV patient walks on his oxygen and desats to 86.  He becomes very short of breath.  We will get him in the hospital work on improving his COPD exacerbation.         ____________________________________________   FINAL CLINICAL  IMPRESSION(S) / ED DIAGNOSES  Final diagnoses:  COPD exacerbation ([place])  Hypoxia     ED Discharge Orders    None       Note:  This document was prepared using Dragon voice recognition software and may include unintentional dictation errors.    Nena Polio, MD 12/31/18 1757

## 2018-12-31 NOTE — H&P (Addendum)
[facility] [place] NAME: Leslie Sims    MR#:  [id number]  DATE OF BIRTH:  1951-09-16  DATE OF ADMISSION:  12/31/2018  PRIMARY CARE PHYSICIAN: [name], MD   REQUESTING/REFERRING PHYSICIAN: Nena Polio, MD  CHIEF COMPLAINT:   Chief Complaint  Patient presents with  . Shortness of Breath    HISTORY OF PRESENT ILLNESS: Leslie Sims  is a 68 y.o. male with a known history of COPD, coronary artery disease, GERD, depression, hypertension, hyperlipidemia who is presenting to the hospital with complaint of shortness of breath.  Patient states that his symptoms have gradually worsened over the past 4 days.  He also has had some productive cough.  Patient chronically is on 2-1/2 L of oxygen at home.  He denies any fevers chills no chest pain or palpitations no nausea vomiting or diarrhea.   PAST MEDICAL HISTORY:   Past Medical History:  Diagnosis Date  . Allergic rhinitis   . Anxiety   . Back pain   . Colon polyps 08/2012   colonoscopy; multiple colon polyps; repeat colonoscopy in one year.  [name] COPD (chronic obstructive pulmonary disease) ([place])   . Coronary artery disease 11/2009   a. late presenting ant MI; b. LHC 100% pLAD s/p PCI/DES, 99% mRCA s/p PCI/DES, EF 35%; c. nuclear stress test 05/13: prior ant/inf infarcts w/o ischemia, EF 43%; d. LHC 02/14: widely patent stents with no other obs dz, EF 40%; e. LHC 11/17: LM nl, mLAD 10%, patent LAD stent, dLAD 20%, p-mRCA 10%, mRCA 40%, patent RCA stent, EF 35-45%   . Depression   . GERD (gastroesophageal reflux disease)   . Headache(784.0)   . Helicobacter pylori (H. pylori)   . Hemorrhoids   . Hernia    inguinal  . Hyperlipidemia   . Hypertension   . Insomnia   . Ischemic cardiomyopathy   . Myalgia   . Peptic ulcer   . Pulmonary nodules   . ST elevation (STEMI) myocardial infarction involving left anterior descending coronary artery ([place]) 11/2009   a. s/p PCI to the LAD   . Status post dilation of esophageal narrowing 2000  . Tinea pedis   . Wears dentures    partial upper    PAST SURGICAL HISTORY:  Past Surgical History:  Procedure Laterality Date  . Admission  12/20/2012   COPD exacerbation.  [place].  [name] CARDIAC CATHETERIZATION    . CARDIAC CATHETERIZATION  02/05/13   [facility]  . CARDIAC CATHETERIZATION  10/14   [facility] : patent stents with no change in anatomy. EF: 40$  . CARDIAC CATHETERIZATION Left 11/12/2016   Procedure: Left Heart Cath and Coronary Angiography;  Surgeon: Wellington Hampshire, MD;  Location: [facility];  Service: Cardiovascular;  Laterality: Left;  . COLONOSCOPY    . COLONOSCOPY WITH PROPOFOL N/A 05/18/2016   Procedure: COLONOSCOPY WITH PROPOFOL;  Surgeon: Lucilla Lame, MD;  Location: [facility] ENDOSCOPY;  Service: Endoscopy;  Laterality: N/A;  . CORONARY ANGIOPLASTY WITH STENT PLACEMENT  09/2009   LAD 3.0 X23 mm Xience DES, RCA: 4.0 X 15 mm Xience DES  . ELECTROMAGNETIC NAVIGATION BROCHOSCOPY N/A 10/27/2017   Procedure: ELECTROMAGNETIC NAVIGATION BRONCHOSCOPY;  Surgeon: Flora Lipps, MD;  Location: [facility] ORS;  Service: Cardiopulmonary;  Laterality: N/A;  . ESOPHAGEAL DILATION    . ESOPHAGOGASTRODUODENOSCOPY  2008  . ESOPHAGOGASTRODUODENOSCOPY  08/20/2012  . HERNIA REPAIR  07/20/2012   L inguinal hernia repair  . PENILE PROSTHESIS IMPLANT  SOCIAL HISTORY:  Social History   Tobacco Use  . Smoking status: Current Every Day Smoker    Packs/day: 1.00    Years: 42.00    Pack years: 42.00    Types: Cigarettes  . Smokeless tobacco: Never Used  Substance Use Topics  . Alcohol use: No    Alcohol/week: 0.0 standard drinks    FAMILY HISTORY:  Family History  Problem Relation Age of Onset  . Heart attack Brother        Brother #1  . Diabetes Brother   . Hypertension Brother        #3  . Coronary artery disease Father 64       deceased  . Heart attack Father   . Diabetes Father   . Heart disease Father   . COPD Mother 83        deceased  . Alcohol abuse Sister        polysubstance abuse  . COPD Sister   . Lung cancer Sister   . Alcohol abuse Sister        polysubstance abuse  . Penile cancer Brother   . Diabetes Brother     DRUG ALLERGIES:  Allergies  Allergen Reactions  . Prozac [Fluoxetine Hcl] Shortness Of Breath  . Effexor Xr [Venlafaxine Hcl Er] Other (See Comments)    "Makes me feel funny"  . Wellbutrin [Bupropion] Other (See Comments)    "Makes me feel funny"    REVIEW OF SYSTEMS:   CONSTITUTIONAL: No fever, fatigue or weakness.  EYES: No blurred or double vision.  EARS, NOSE, AND THROAT: No tinnitus or ear pain.  RESPIRATORY: Positive cough, positive shortness of breath, positive wheezing or hemoptysis.  CARDIOVASCULAR: No chest pain, orthopnea, edema.  GASTROINTESTINAL: No nausea, vomiting, diarrhea or abdominal pain.  GENITOURINARY: No dysuria, hematuria.  ENDOCRINE: No polyuria, nocturia,  HEMATOLOGY: No anemia, easy bruising or bleeding SKIN: No rash or lesion. MUSCULOSKELETAL: No joint pain or arthritis.   NEUROLOGIC: No tingling, numbness, weakness.  PSYCHIATRY: No anxiety or depression.   MEDICATIONS AT HOME:  Prior to Admission medications   Medication Sig Start Date End Date Taking? Authorizing Provider  acetaminophen (TYLENOL) 325 MG tablet Take 2 tablets (650 mg total) by mouth every 6 (six) hours as needed for mild pain (or Fever >/= 101). 08/26/18  Yes Gouru, Aruna, MD  albuterol (PROVENTIL HFA;VENTOLIN HFA) 108 (90 Base) MCG/ACT inhaler Inhale 2 puffs into the lungs every 6 (six) hours as needed for wheezing or shortness of breath. 11/01/18  Yes Veronese, [place], MD  budesonide (PULMICORT) 0.25 MG/2ML nebulizer solution Take 2 mLs (0.25 mg total) by nebulization 2 (two) times daily. 12/25/18 12/25/19 Yes Wilhelmina Mcardle, MD  ARIPiprazole (ABILIFY) 5 MG tablet Take 5 mg by mouth daily. 07/26/18   [provider]  aspirin 81 MG tablet Take 81 mg by mouth daily.       [provider]  atorvastatin (LIPITOR) 40 MG tablet Take 1 tablet (40 mg total) by mouth daily. 03/03/18   [name], MD  DULoxetine (CYMBALTA) 30 MG capsule duloxetine 30 mg capsule,delayed release    [provider]  feeding supplement, ENSURE ENLIVE, (ENSURE ENLIVE) LIQD Take 237 mLs by mouth 3 (three) times daily between meals. 10/01/18   Epifanio Lesches, MD  Fluticasone-Salmeterol (ADVAIR) 500-50 MCG/DOSE AEPB Inhale 1 puff into the lungs every 12 (twelve) hours. 11/29/18   [provider]  furosemide (LASIX) 20 MG tablet Take 1 tablet (20 mg total) by  mouth daily. 07/07/18 09/30/19  [name], MD  hydrOXYzine (VISTARIL) 25 MG capsule Take 25 mg by mouth 2 (two) times daily as needed. 06/05/18   [provider]  ipratropium-albuterol (DUONEB) 0.5-2.5 (3) MG/3ML SOLN Take 3 mLs by nebulization 4 (four) times daily. DX:J44.9 12/04/18   Wilhelmina Mcardle, MD  LORazepam (ATIVAN) 1 MG tablet Take 1 tablet (1 mg total) by mouth every 8 (eight) hours as needed for anxiety. 07/07/18   [name], MD  losartan (COZAAR) 25 MG tablet TAKE 1 TABLET DAILY 01/27/18   Minna Merritts, MD  mirtazapine (REMERON) 30 MG tablet Take 1 tablet (30 mg total) by mouth at bedtime. 06/24/18   [name], MD  Multiple Vitamin (MULTIVITAMIN) capsule Take 1 capsule by mouth daily.    [provider]  nitroGLYCERIN (NITROSTAT) 0.4 MG SL tablet Place 1 tablet (0.4 mg total) under the tongue every 5 (five) minutes as needed. 10/17/17   Wellington Hampshire, MD  sertraline (ZOLOFT) 100 MG tablet Take 100 mg by mouth daily.  06/28/18   [provider]  sucralfate (CARAFATE) 1 GM/10ML suspension Take 10 mLs (1 g total) by mouth 4 (four) times daily -  with meals and at bedtime. Patient taking differently: Take 1 g by mouth 4 (four) times daily as needed.  05/16/18   [name], MD  tiotropium (SPIRIVA) 18 MCG inhalation capsule Place 18 mcg into inhaler  and inhale daily. 11/29/18   [provider]      PHYSICAL EXAMINATION:   VITAL SIGNS: Blood pressure 120/68, pulse 91, temperature 98.7 F (37.1 C), temperature source Oral, resp. rate 20, height 5\' 10"  (1.778 m), weight 63.5 kg, SpO2 95 %.  GENERAL:  68 y.o.-year-old patient lying in the bed with no acute distress.  EYES: Pupils equal, round, reactive to light and accommodation. No scleral icterus. Extraocular muscles intact.  HEENT: Head atraumatic, normocephalic. Oropharynx and nasopharynx clear.  NECK:  Supple, no jugular venous distention. No thyroid enlargement, no tenderness.  LUNGS: Bilateral wheezing throughout both lung CARDIOVASCULAR: S1, S2 normal. No murmurs, rubs, or gallops.  ABDOMEN: Soft, nontender, nondistended. Bowel sounds present. No organomegaly or mass.  EXTREMITIES: No pedal edema, cyanosis, or clubbing.  NEUROLOGIC: Cranial nerves II through XII are intact. Muscle strength 5/5 in all extremities. Sensation intact. Gait not checked.  PSYCHIATRIC: The patient is alert and oriented x 3.  SKIN: No obvious rash, lesion, or ulcer.   LABORATORY PANEL:   CBC Recent Labs  Lab 12/31/18 1619  WBC 7.7  HGB 11.3*  HCT 36.6*  PLT 229  MCV 95.3  MCH 29.4  MCHC 30.9  RDW 12.2  LYMPHSABS 1.7  MONOABS 1.2*  EOSABS 0.1  BASOSABS 0.0   ------------------------------------------------------------------------------------------------------------------  Chemistries  Recent Labs  Lab 12/31/18 1619  NA 139  K 3.9  CL 91*  CO2 42*  GLUCOSE 123*  BUN 8  CREATININE 0.63  CALCIUM 8.9  AST 24  ALT 20  ALKPHOS 62  BILITOT 0.5   ------------------------------------------------------------------------------------------------------------------ estimated creatinine clearance is 82.7 mL/min (by C-G formula based on SCr of 0.63 mg/dL). ------------------------------------------------------------------------------------------------------------------ No  results for input(s): TSH, T4TOTAL, T3FREE, THYROIDAB in the last 72 hours.  Invalid input(s): FREET3   Coagulation profile No results for input(s): INR, PROTIME in the last 168 hours. ------------------------------------------------------------------------------------------------------------------- No results for input(s): DDIMER in the last 72 hours. -------------------------------------------------------------------------------------------------------------------  Cardiac Enzymes Recent Labs  Lab 12/31/18 1619  TROPONINI <0.03   ------------------------------------------------------------------------------------------------------------------ Mertha Finders  input(s): POCBNP  ---------------------------------------------------------------------------------------------------------------  Urinalysis    Component Value Date/Time   COLORURINE YELLOW (A) 10/18/2018 1943   APPEARANCEUR CLEAR (A) 10/18/2018 1943   APPEARANCEUR Clear 01/10/2013 1205   LABSPEC 1.009 10/18/2018 1943   LABSPEC 1.004 01/10/2013 1205   PHURINE 6.0 10/18/2018 [place] 10/18/2018 1943   GLUCOSEU 50 mg/dL 01/10/2013 [place] 10/18/2018 [place] 10/18/2018 1943   BILIRUBINUR negative 07/27/2017 1259   BILIRUBINUR neg 05/10/2014 1208   BILIRUBINUR Negative 01/10/2013 [place] 10/18/2018 1943   PROTEINUR NEGATIVE 10/18/2018 1943   UROBILINOGEN 0.2 07/27/2017 1259   NITRITE NEGATIVE 10/18/2018 1943   LEUKOCYTESUR NEGATIVE 10/18/2018 1943   LEUKOCYTESUR Negative 01/10/2013 1205     RADIOLOGY: Dg Chest 2 View  Result Date: 12/31/2018 CLINICAL DATA:  Shortness of breath with productive cough for 4 days. Smoker. EXAM: CHEST - 2 VIEW COMPARISON:  Radiographs 12/16/2018 and 11/20/2018. Chest CT 10/19/2018. FINDINGS: The heart size and mediastinal contours are stable. There is aortic atherosclerosis. There is chronic lung disease with hyperinflation,  central airway thickening and emphysema. Biapical scarring is grossly stable. There is no pleural effusion or pneumothorax. Old rib fractures on the left and soft tissue calcification anterior to the right scapula are noted. IMPRESSION: No acute findings.  Radiographically stable appearance of the chest. Electronically Signed   By: Richardean Sale M.D.   On: 12/31/2018 17:34    EKG: Orders placed or performed during the hospital encounter of 12/31/18  . ED EKG  . ED EKG    IMPRESSION AND PLAN: Patient 68 year old with COPD presenting with shortness of breath  1.  Acute on chronic respiratory failure due to acute on chronic COPD exacerbation We will treat with nebulizer, IV Solu-Medrol and Pulmicort nebs Oral doxycycline for acute bronchitis  2.  Coronary artery disease continue therapy with aspirin  3.  Hyperlipidemia continue Lipitor  4.  Depression continue Zoloft   5.  Hypertension continue Cozaar  6.  Miscellaneous Lovenox for DVT prophylax  7.  Nicotine abuse smoking cessation provided 4 minutes spent strongly recommend he stop smoking nicotine patch offered  All the records are reviewed and case discussed with ED provider. Management plans discussed with the patient, family and they are in agreement.  CODE STATUS: Code Status History    Date Active Date Inactive Code Status Order ID Comments User Context   10/01/2018 1243 10/01/2018 1759 Full Code [id number]  Lucious Groves, RN Inpatient   09/13/2018 1949 09/15/2018 1719 Full Code [id number]  Loletha Grayer, MD ED   08/25/2018 0140 08/26/2018 1737 Full Code [id number]  Harrie Foreman, MD Inpatient   07/02/2018 1903 07/03/2018 2216 Full Code [id number]  Hillary Bow, MD ED   12/26/2017 0129 12/30/2017 2106 Full Code [id number]  Amelia Jo, MD Inpatient       TOTAL TIME TAKING CARE OF THIS PATIENT: 55 minutes.    [name] M.D on 12/31/2018 at 6:36 PM  Between 7am to 6pm - Pager - [phone]  After 6pm go to  [email or web address] - password [email or web address]  [facility] Office  [phone]  CC: Primary care physician; [name], MD

## 2018-12-31 NOTE — ED Notes (Signed)
This RN ambulated pt down the hall to assess breathing. While ambulating oxygen saturation was steady at 93% until pt suddenly stopped and said that he was short of breath. Pt oxygen saturation reading was steady at 86% on room air.

## 2018-12-31 NOTE — Progress Notes (Signed)
Advanced care plan.  Purpose of the Encounter: CODE STATUS  Parties in Attendance: Patient himself  Patient's Decision Capacity: Intact  Subjective/Patient's story: Patient 68 year old with chronic respiratory failure due to COPD, coronary artery disease being admitted with acute on chronic shortness of breath   Objective/Medical story Discussed with patient regarding his desires for cardiac and pulmonary resuscitation   Goals of care determination:  Patient states that he would like to be a full code   CODE STATUS: Full code   Time spent discussing advanced care planning: 16 minutes

## 2018-12-31 NOTE — ED Triage Notes (Signed)
Pt arrived via ems with SOB for a few days. Pt has hx of COPD and 2 stents. Lung sounds limited on right side per ems. 112/58, 79, 97% on RA, and cbg-156. Pt reports that he was just started on a new inhaler recently, Bromtou. Pt NAD at present.

## 2019-01-01 LAB — CBC
HCT: 37.1 % — ABNORMAL LOW (ref 39.0–52.0)
Hemoglobin: 11.5 g/dL — ABNORMAL LOW (ref 13.0–17.0)
MCH: 29.5 pg (ref 26.0–34.0)
MCHC: 31 g/dL (ref 30.0–36.0)
MCV: 95.1 fL (ref 80.0–100.0)
Platelets: 232 10*3/uL (ref 150–400)
RBC: 3.9 MIL/uL — AB (ref 4.22–5.81)
RDW: 11.9 % (ref 11.5–15.5)
WBC: 5.4 10*3/uL (ref 4.0–10.5)
nRBC: 0 % (ref 0.0–0.2)

## 2019-01-01 LAB — BASIC METABOLIC PANEL
Anion gap: 4 — ABNORMAL LOW (ref 5–15)
BUN: 11 mg/dL (ref 8–23)
CO2: 43 mmol/L — ABNORMAL HIGH (ref 22–32)
Calcium: 9.4 mg/dL (ref 8.9–10.3)
Chloride: 94 mmol/L — ABNORMAL LOW (ref 98–111)
Creatinine, Ser: 0.55 mg/dL — ABNORMAL LOW (ref 0.61–1.24)
GFR calc Af Amer: >60 mL/min (ref >60)
GFR calc non Af Amer: >60 mL/min (ref >60)
Glucose, Bld: 139 mg/dL — ABNORMAL HIGH (ref 70–99)
Potassium: 4.7 mmol/L (ref 3.5–5.1)
Sodium: 141 mmol/L (ref 135–145)

## 2019-01-01 MED ORDER — PREDNISONE 10 MG PO TABS: ORAL_TABLET | ORAL | 0 refills | Status: DC

## 2019-01-01 MED ORDER — ALUM & MAG HYDROXIDE-SIMETH 200-200-20 MG/5ML PO SUSP
30.0000 mL | ORAL | Status: DC | PRN
  Administered 2019-01-01: 17:00:00 30 mL via ORAL
  Filled 2019-01-01: qty 30

## 2019-01-01 MED ORDER — DOXYCYCLINE HYCLATE 100 MG PO TABS: 100.0000 mg | ORAL_TABLET | Freq: Two times a day (BID) | ORAL | 0 refills | Status: AC

## 2019-01-01 NOTE — Progress Notes (Signed)
   01/01/19 0200  Clinical Encounter Type  Visited With Patient  Visit Type Initial  Referral From Physician  Spiritual Encounters  Spiritual Needs Prayer;Emotional;Other (Comment)  Stress Factors  Patient Stress Factors Family relationships;Loss;Major life changes;Other (Comment)

## 2019-01-01 NOTE — Progress Notes (Signed)
   01/01/19 1000  Clinical Encounter Type  Visited With Patient and family together  Visit Type Spiritual support  Referral From Patient  Consult/Referral To Chaplain  Spiritual Encounters  Spiritual Needs Prayer;Emotional;Grief support  Stress Factors  Patient Stress Factors Loss;Major life changes  Pt requested Ch visit. Pt was dealing with grief. Pt experienced many losses over the past few years and going back home stirred up his emotions. His aunt is currently in CCU. The daughter joined 81. Ch offered prayer and grief support.

## 2019-01-01 NOTE — Progress Notes (Signed)
[facility] [place] NAME: Leslie Sims    MR#:  [id number]  DATE OF BIRTH:  July 16, 1951  SUBJECTIVE:   Patient admitted to the hospital secondary to shortness of breath and cough and noted to be in COPD exacerbation.  Feels a little bit better since yesterday.  Still having some exertional dyspnea.  REVIEW OF SYSTEMS:    Review of Systems  Constitutional: Negative for chills and fever.  HENT: Negative for congestion and tinnitus.   Eyes: Negative for blurred vision and double vision.  Respiratory: Positive for cough and shortness of breath. Negative for wheezing.   Cardiovascular: Negative for chest pain, orthopnea and PND.  Gastrointestinal: Negative for abdominal pain, diarrhea, nausea and vomiting.  Genitourinary: Negative for dysuria and hematuria.  Neurological: Negative for dizziness, sensory change and focal weakness.  All other systems reviewed and are negative.   Nutrition: Heart Healthy Tolerating Diet: Yes Tolerating PT: Ambulatory  DRUG ALLERGIES:   Allergies  Allergen Reactions  . Prozac [Fluoxetine Hcl] Shortness Of Breath  . Effexor Xr [Venlafaxine Hcl Er] Other (See Comments)    "Makes me feel funny"  . Wellbutrin [Bupropion] Other (See Comments)    "Makes me feel funny"    VITALS:  Blood pressure 139/72, pulse 72, temperature 97.9 F (36.6 C), resp. rate 18, height 5\' 10"  (1.778 m), weight 63.5 kg, SpO2 92 %.  PHYSICAL EXAMINATION:   Physical Exam  GENERAL:  68 y.o.-year-old patient lying in bed in no acute distress.  EYES: Pupils equal, round, reactive to light and accommodation. No scleral icterus. Extraocular muscles intact.  HEENT: Head atraumatic, normocephalic. Oropharynx and nasopharynx clear.  NECK:  Supple, no jugular venous distention. No thyroid enlargement, no tenderness.  LUNGS: Prolonged inspiratory and expiratory phase.  End expiratory wheezing bilaterally, no rales, rhonchi.   Negative use of accessory muscles.   CARDIOVASCULAR: S1, S2 normal. No murmurs, rubs, or gallops.  ABDOMEN: Soft, nontender, nondistended. Bowel sounds present. No organomegaly or mass.  EXTREMITIES: No cyanosis, clubbing or edema b/l.    NEUROLOGIC: Cranial nerves II through XII are intact. No focal Motor or sensory deficits b/l.   PSYCHIATRIC: The patient is alert and oriented x 3.  SKIN: No obvious rash, lesion, or ulcer.    LABORATORY PANEL:   CBC Recent Labs  Lab 01/01/19 0426  WBC 5.4  HGB 11.5*  HCT 37.1*  PLT 232   ------------------------------------------------------------------------------------------------------------------  Chemistries  Recent Labs  Lab 12/31/18 1619 01/01/19 0426  NA 139 141  K 3.9 4.7  CL 91* 94*  CO2 42* 43*  GLUCOSE 123* 139*  BUN 8 11  CREATININE 0.63 0.55*  CALCIUM 8.9 9.4  AST 24  --   ALT 20  --   ALKPHOS 62  --   BILITOT 0.5  --    ------------------------------------------------------------------------------------------------------------------  Cardiac Enzymes Recent Labs  Lab 12/31/18 1619  TROPONINI <0.03   ------------------------------------------------------------------------------------------------------------------  RADIOLOGY:  Dg Chest 2 View  Result Date: 12/31/2018 CLINICAL DATA:  Shortness of breath with productive cough for 4 days. Smoker. EXAM: CHEST - 2 VIEW COMPARISON:  Radiographs 12/16/2018 and 11/20/2018. Chest CT 10/19/2018. FINDINGS: The heart size and mediastinal contours are stable. There is aortic atherosclerosis. There is chronic lung disease with hyperinflation, central airway thickening and emphysema. Biapical scarring is grossly stable. There is no pleural effusion or pneumothorax. Old rib fractures on the left and soft tissue calcification anterior to the right scapula are noted. IMPRESSION:  No acute findings.  Radiographically stable appearance of the chest. Electronically Signed   By: Richardean Sale M.D.   On: 12/31/2018 17:34     ASSESSMENT AND PLAN:   68 year old male with past medical history of chronic respiratory failures, COPD, GERD, depression, hypertension, hyperlipidemia, anxiety, who presented to the hospital due to cough, shortness of breath and noted to be in acute on chronic respiratory failure with hypoxia.  1.  Acute on chronic respiratory failure with hypoxia-secondary to COPD exacerbation. - Continue O2 supplementation, continue IV steroids, scheduled duo nebs, Pulmicort nebs. -Continue empiric doxycycline.  Wean O2 as tolerated.  2.  COPD exacerbation-source of patient's worsening hypoxemia. - Continue treatment as mentioned above.  Continue IV steroids, scheduled duo nebs, Pulmicort nebs.  - already on O2 at home. Had some exertional dyspnea today but improving.   3.  Anxiety/depression-continue Abilify, Cymbalta, Zoloft, Ativan as needed.  4.  Essential hypertension-continue losartan  5. Hyperlipidemia - cont. Atorvastatin  6. Tobacco abuse - cont. Nicotine patch.    All the records are reviewed and case discussed with Care Management/Social Worker. Management plans discussed with the patient, family and they are in agreement.  CODE STATUS: Full code  DVT Prophylaxis: Lovenox  TOTAL TIME TAKING CARE OF THIS PATIENT: 30 minutes.   POSSIBLE D/C IN 1-2 DAYS, DEPENDING ON CLINICAL CONDITION.   Henreitta Leber M.D on 01/01/2019 at 1:06 PM  Between 7am to 6pm - Pager - [phone]  After 6pm go to [email or web address] - Proofreader  [facility]  Office  [phone]  CC: Primary care physician; [name], MD

## 2019-01-02 NOTE — Progress Notes (Signed)
Pt ambulates around unit with 2L O2 vis [place]. O2sats 92%. Notified MD

## 2019-01-02 NOTE — Discharge Summary (Signed)
[facility] [place] NAME: Leslie Sims    MR#:  [id number]  DATE OF BIRTH:  08-19-1951  DATE OF ADMISSION:  12/31/2018 ADMITTING PHYSICIAN: Lenore Cordia, MD  DATE OF DISCHARGE: 01/02/2019  PRIMARY CARE PHYSICIAN: [name], MD    ADMISSION DIAGNOSIS:  Hypoxia [R09.02] COPD exacerbation (HCC) [J44.1]  DISCHARGE DIAGNOSIS:  Active Problems:   COPD with acute exacerbation ([place])   SECONDARY DIAGNOSIS:   Past Medical History:  Diagnosis Date  . Allergic rhinitis   . Anxiety   . Back pain   . Colon polyps 08/2012   colonoscopy; multiple colon polyps; repeat colonoscopy in one year.  [name] COPD (chronic obstructive pulmonary disease) ([place])   . Coronary artery disease 11/2009   a. late presenting ant MI; b. LHC 100% pLAD s/p PCI/DES, 99% mRCA s/p PCI/DES, EF 35%; c. nuclear stress test 05/13: prior ant/inf infarcts w/o ischemia, EF 43%; d. LHC 02/14: widely patent stents with no other obs dz, EF 40%; e. LHC 11/17: LM nl, mLAD 10%, patent LAD stent, dLAD 20%, p-mRCA 10%, mRCA 40%, patent RCA stent, EF 35-45%   . Depression   . GERD (gastroesophageal reflux disease)   . Headache(784.0)   . Helicobacter pylori (H. pylori)   . Hemorrhoids   . Hernia    inguinal  . Hyperlipidemia   . Hypertension   . Insomnia   . Ischemic cardiomyopathy   . Myalgia   . Peptic ulcer   . Pulmonary nodules   . ST elevation (STEMI) myocardial infarction involving left anterior descending coronary artery ([place]) 11/2009   a. s/p PCI to the LAD  . Status post dilation of esophageal narrowing 2000  . Tinea pedis   . Wears dentures    partial upper    HOSPITAL COURSE:   68 year old male with past medical history of chronic respiratory failures, COPD, GERD, depression, hypertension, hyperlipidemia, anxiety, who presented to the hospital due to cough, shortness of breath and noted to be in acute on chronic respiratory failure with hypoxia.  1.  Acute  on chronic respiratory failure with hypoxia-secondary to COPD exacerbation. -Patient was treated with IV steroids, scheduled duo nebs, Pulmicort nebs.  He was also given empiric doxycycline.  Patient has improved and has less wheezing and bronchospasm.  Now being discharged on oral prednisone taper and oral antibiotics.  He will continue his oxygen at home.  2.  COPD exacerbation-source of patient's worsening hypoxemia. -As mentioned above patient was treated with IV steroids, scheduled duo nebs, Pulmicort nebs.  His exertional dyspnea has improved.  He is now being discharged on oral prednisone taper, empiric doxycycline. - pt. Is now being discharged on home and will cont. His home O2.  - he will cont. His inhalers during albuterol, Advair, Spiriva  3.  Anxiety/depression-pt. Will continue Abilify, Cymbalta, Zoloft, Ativan as needed.  4.  Essential hypertension-pt will continue losartan  5. Hyperlipidemia - pt. Will cont. Atorvastatin  6. Tobacco abuse - while in the hospital pt. Was on Nicotine patch and strongly advised to quit smoking.   DISCHARGE CONDITIONS:   Stable.   CONSULTS OBTAINED:    DRUG ALLERGIES:   Allergies  Allergen Reactions  . Prozac [Fluoxetine Hcl] Shortness Of Breath  . Effexor Xr [Venlafaxine Hcl Er] Other (See Comments)    "Makes me feel funny"  . Wellbutrin [Bupropion] Other (See Comments)    "Makes me feel funny"    DISCHARGE MEDICATIONS:   Allergies as  of 01/02/2019      Reactions   Prozac [fluoxetine Hcl] Shortness Of Breath   Effexor Xr [venlafaxine Hcl Er] Other (See Comments)   "Makes me feel funny"   Wellbutrin [bupropion] Other (See Comments)   "Makes me feel funny"      Medication List    TAKE these medications   acetaminophen 325 MG tablet Commonly known as:  TYLENOL Take 2 tablets (650 mg total) by mouth every 6 (six) hours as needed for mild pain (or Fever >/= 101).   albuterol 108 (90 Base) MCG/ACT inhaler Commonly known  as:  PROVENTIL HFA;VENTOLIN HFA Inhale 2 puffs into the lungs every 6 (six) hours as needed for wheezing or shortness of breath.   ARIPiprazole 5 MG tablet Commonly known as:  ABILIFY Take 5 mg by mouth daily.   aspirin 81 MG tablet Take 81 mg by mouth daily.   atorvastatin 40 MG tablet Commonly known as:  LIPITOR Take 1 tablet (40 mg total) by mouth daily.   budesonide 0.25 MG/2ML nebulizer solution Commonly known as:  PULMICORT Take 2 mLs (0.25 mg total) by nebulization 2 (two) times daily.   doxycycline 100 MG tablet Commonly known as:  VIBRA-TABS Take 1 tablet (100 mg total) by mouth every 12 (twelve) hours for 5 days.   DULoxetine 30 MG capsule Commonly known as:  CYMBALTA Take 30 mg by mouth daily.   feeding supplement (ENSURE ENLIVE) Liqd Take 237 mLs by mouth 3 (three) times daily between meals.   Fluticasone-Salmeterol 500-50 MCG/DOSE Aepb Commonly known as:  ADVAIR Inhale 1 puff into the lungs every 12 (twelve) hours.   furosemide 20 MG tablet Commonly known as:  LASIX Take 1 tablet (20 mg total) by mouth daily.   hydrOXYzine 25 MG capsule Commonly known as:  VISTARIL Take 25 mg by mouth 2 (two) times daily as needed for anxiety or itching.   ipratropium-albuterol 0.5-2.5 (3) MG/3ML Soln Commonly known as:  DUONEB Take 3 mLs by nebulization 4 (four) times daily. DX:J44.9   LORazepam 1 MG tablet Commonly known as:  ATIVAN Take 1 tablet (1 mg total) by mouth every 8 (eight) hours as needed for anxiety.   losartan 25 MG tablet Commonly known as:  COZAAR TAKE 1 TABLET DAILY   mirtazapine 30 MG tablet Commonly known as:  REMERON Take 1 tablet (30 mg total) by mouth at bedtime.   multivitamin capsule Take 1 capsule by mouth daily.   nitroGLYCERIN 0.4 MG SL tablet Commonly known as:  NITROSTAT Place 1 tablet (0.4 mg total) under the tongue every 5 (five) minutes as needed.   predniSONE 10 MG tablet Commonly known as:  DELTASONE Label  & dispense  according to the schedule below. 5 Pills PO for 1 day then, 4 Pills PO for 1 day, 3 Pills PO for 1 day, 2 Pills PO for 1 day, 1 Pill PO for 1 days then STOP.   sertraline 100 MG tablet Commonly known as:  ZOLOFT Take 100 mg by mouth daily.   sucralfate 1 GM/10ML suspension Commonly known as:  CARAFATE Take 10 mLs (1 g total) by mouth 4 (four) times daily -  with meals and at bedtime. What changed:    when to take this  reasons to take this   tiotropium 18 MCG inhalation capsule Commonly known as:  SPIRIVA Place 18 mcg into inhaler and inhale daily.         DISCHARGE INSTRUCTIONS:   DIET:  Cardiac diet  DISCHARGE  CONDITION:  Stable  ACTIVITY:  Activity as tolerated  OXYGEN:  Home Oxygen: Yes.     Oxygen Delivery: 2 liters/min via Patient connected to nasal cannula oxygen  DISCHARGE LOCATION:  Home with [employer].     If you experience worsening of your admission symptoms, develop shortness of breath, life threatening emergency, suicidal or homicidal thoughts you must seek medical attention immediately by calling 911 or calling your MD immediately  if symptoms less severe.  You Must read complete instructions/literature along with all the possible adverse reactions/side effects for all the Medicines you take and that have been prescribed to you. Take any new Medicines after you have completely understood and accpet all the possible adverse reactions/side effects.   Please note  You were cared for by a hospitalist during your hospital stay. If you have any questions about your discharge medications or the care you received while you were in the hospital after you are discharged, you can call the unit and asked to speak with the hospitalist on call if the hospitalist that took care of you is not available. Once you are discharged, your primary care physician will handle any further medical issues. Please note that NO REFILLS for any discharge medications will be  authorized once you are discharged, as it is imperative that you return to your primary care physician (or establish a relationship with a primary care physician if you do not have one) for your aftercare needs so that they can reassess your need for medications and monitor your lab values.     Today   No acute events overnight.  Respiratory status stable.  Patient ambulated and has not had any worsening dyspnea and will discharge home today.  VITAL SIGNS:  Blood pressure 126/73, pulse 93, temperature 98.3 F (36.8 C), temperature source Oral, resp. rate 16, height 5\' 10"  (1.778 m), weight 63.5 kg, SpO2 95 %.  I/O:    Intake/Output Summary (Last 24 hours) at 01/02/2019 1347 Last data filed at 01/02/2019 1006 Gross per 24 hour  Intake 600 ml  Output 3100 ml  Net -2500 ml    PHYSICAL EXAMINATION:   GENERAL:  68 y.o.-year-old patient lying in bed in no acute distress.  EYES: Pupils equal, round, reactive to light and accommodation. No scleral icterus. Extraocular muscles intact.  HEENT: Head atraumatic, normocephalic. Oropharynx and nasopharynx clear.  NECK:  Supple, no jugular venous distention. No thyroid enlargement, no tenderness.  LUNGS: Prolonged inspiratory and expiratory phase.  End expiratory wheezing bilaterally, no rales, rhonchi.  Negative use of accessory muscles.   CARDIOVASCULAR: S1, S2 normal. No murmurs, rubs, or gallops.  ABDOMEN: Soft, nontender, nondistended. Bowel sounds present. No organomegaly or mass.  EXTREMITIES: No cyanosis, clubbing or edema b/l.    NEUROLOGIC: Cranial nerves II through XII are intact. No focal Motor or sensory deficits b/l.   PSYCHIATRIC: The patient is alert and oriented x 3.  SKIN: No obvious rash, lesion, or ulcer.   DATA REVIEW:   CBC Recent Labs  Lab 01/01/19 0426  WBC 5.4  HGB 11.5*  HCT 37.1*  PLT 232    Chemistries  Recent Labs  Lab 12/31/18 1619 01/01/19 0426  NA 139 141  K 3.9 4.7  CL 91* 94*  CO2 42* 43*   GLUCOSE 123* 139*  BUN 8 11  CREATININE 0.63 0.55*  CALCIUM 8.9 9.4  AST 24  --   ALT 20  --   ALKPHOS 62  --   BILITOT 0.5  --  Cardiac Enzymes Recent Labs  Lab 12/31/18 1619  TROPONINI <0.03    Microbiology Results  Results for orders placed or performed during the hospital encounter of 09/29/18  Blood culture (routine x 2)     Status: None   Collection Time: 09/29/18  6:35 PM  Result Value Ref Range Status   Specimen Description BLOOD L ARM  Final   Special Requests   Final    BOTTLES DRAWN AEROBIC AND ANAEROBIC Blood Culture results may not be optimal due to an excessive volume of blood received in culture bottles   Culture   Final    NO GROWTH 5 DAYS Performed at [facility], [address] [place], [place] [address]    Report Status 10/04/2018 FINAL  Final  Blood culture (routine x 2)     Status: None   Collection Time: 09/29/18  6:35 PM  Result Value Ref Range Status   Specimen Description BLOOD R ARM  Final   Special Requests   Final    BOTTLES DRAWN AEROBIC AND ANAEROBIC Blood Culture adequate volume   Culture   Final    NO GROWTH 5 DAYS Performed at [facility], [address] [place], [place] [address]    Report Status 10/04/2018 FINAL  Final  MRSA PCR Screening     Status: None   Collection Time: 09/29/18  9:22 PM  Result Value Ref Range Status   MRSA by PCR NEGATIVE NEGATIVE Final    Comment:        The GeneXpert MRSA Assay (FDA approved for NASAL specimens only), is one component of a comprehensive MRSA colonization surveillance program. It is not intended to diagnose MRSA infection nor to guide or monitor treatment for MRSA infections. Performed at [facility], [address] [place], [place] [address]     RADIOLOGY:  Dg Chest 2 View  Result Date: 12/31/2018 CLINICAL DATA:  Shortness of breath with productive cough for 4 days. Smoker. EXAM: CHEST - 2 VIEW COMPARISON:  Radiographs 12/16/2018 and  11/20/2018. Chest CT 10/19/2018. FINDINGS: The heart size and mediastinal contours are stable. There is aortic atherosclerosis. There is chronic lung disease with hyperinflation, central airway thickening and emphysema. Biapical scarring is grossly stable. There is no pleural effusion or pneumothorax. Old rib fractures on the left and soft tissue calcification anterior to the right scapula are noted. IMPRESSION: No acute findings.  Radiographically stable appearance of the chest. Electronically Signed   By: Richardean Sale M.D.   On: 12/31/2018 17:34      Management plans discussed with the patient, family and they are in agreement.  CODE STATUS:     Code Status Orders  (From admission, onward)         Start     Ordered   12/31/18 1910  Full code  Continuous     12/31/18 1909          TOTAL TIME TAKING CARE OF THIS PATIENT: 40 minutes.    Henreitta Leber M.D on 01/02/2019 at 1:47 PM  Between 7am to 6pm - Pager - [phone]  After 6pm go to [email or web address] - Proofreader  [facility]  Office  [phone]  CC: Primary care physician; [name], MD

## 2019-01-02 NOTE — Progress Notes (Signed)
Reviewed AVS with pt, pt verbalized understanding. 2 printed RX with AVS.

## 2019-01-03 ENCOUNTER — Telehealth: Payer: Self-pay | Admitting: Pulmonary Disease

## 2019-01-03 NOTE — Telephone Encounter (Signed)
Called patient and scheduled hospital f/u. He can discuss POC order at visit. APPT 01/16/19 @11 .

## 2019-01-05 ENCOUNTER — Telehealth: Payer: Self-pay | Admitting: Family Medicine

## 2019-01-05 NOTE — Telephone Encounter (Signed)
Copied from [place] [id number]. Topic: General - Other >> Jan 05, 2019 12:24 PM Judyann Munson wrote: Reason for CRM: [facility] is calling to advise the patient was not admitted for home health care because the patient is not home bound.  Best contact number is [phone]

## 2019-01-06 ENCOUNTER — Other Ambulatory Visit: Payer: Self-pay

## 2019-01-10 NOTE — Telephone Encounter (Signed)
pls see note. thanks 

## 2019-01-15 ENCOUNTER — Telehealth: Payer: Self-pay | Admitting: Pulmonary Disease

## 2019-01-15 NOTE — Telephone Encounter (Signed)
Spoke to patient, he is stating that he is slightly more short of breath since starting new medications after last visit. He thinks that perhaps this is a side effect of medication. He is scheduled for an apt tomorrow to be evaluated.

## 2019-01-16 ENCOUNTER — Ambulatory Visit (INDEPENDENT_AMBULATORY_CARE_PROVIDER_SITE_OTHER): Payer: Medicare Other | Admitting: Pulmonary Disease

## 2019-01-16 ENCOUNTER — Encounter: Payer: Self-pay | Admitting: Pulmonary Disease

## 2019-01-16 DIAGNOSIS — J9611 Chronic respiratory failure with hypoxia: Secondary | ICD-10-CM

## 2019-01-16 DIAGNOSIS — J441 Chronic obstructive pulmonary disease with (acute) exacerbation: Secondary | ICD-10-CM | POA: Diagnosis not present

## 2019-01-16 DIAGNOSIS — F17200 Nicotine dependence, unspecified, uncomplicated: Secondary | ICD-10-CM | POA: Diagnosis not present

## 2019-01-16 DIAGNOSIS — J449 Chronic obstructive pulmonary disease, unspecified: Secondary | ICD-10-CM

## 2019-01-16 MED ORDER — AZITHROMYCIN 250 MG PO TABS: 250.0000 mg | ORAL_TABLET | ORAL | 5 refills | Status: DC

## 2019-01-16 NOTE — Progress Notes (Signed)
PULMONARY OFFICE FOLLOW UP NOTE  Requesting MD/Service: [name], MD Date of initial consultation: 08/10/16 Reason for consultation: COPD, smoker  PT PROFILE: 37 M smoker with severe emphysema seen by multiple pulmonologists in past ([place], [place], [place], [place]) referred for further eval and mgmt of COPD   DATA: 07/24/12 Office Spirometry: Severe obstruction (FEV1 31% pred) 08/31/13 Office Spirometry: very severe obstruction (FEV1 25% pred) 06/18/15 CT chest: Severe emphysema 03/03/17 Overnight oximetry: Lowest SpO2 72%. 95% of time SpO2 was below 90% 03/03/17 6MWT: 384 meters. Desat to 84% 10/04/17 CT chest: Two new irregularly marginated nodules, one in the right upper lobe, and a second in the mid left upper lobe worrisome for either synchronous lung carcinoma or metastatic involvement of the lungs. The two small nodules described on the prior dictated report have resolved in the left superior segment and most likely were postinflammatory. Diffuse centrilobular emphysema. 10/13/17 PET scan: The new left upper lobe nodule is FDG avid consistent with malignancy. Recommend tissue confirmation. The new nodule in the right upper lobe demonstrates low level uptake. While the level of uptake suggests the possibility of an inflammatory or infectious process, a low-grade malignancy is not excluded. Given the suspected malignancy on the left, tissue confirmation should be considered on the right despite the low level of uptake. No distant metastatic disease identified 10/27/17 ENB ([place]): Transbronchial Fine Needle Aspirations 21G X7, Transbronchial Forceps Biopsy X6. ALVEOLATED LUNG TISSUE WITH HEMOSIDERIN LADEN MACROPHAGES.  NEGATIVE FOR ATYPIA AND MALIGNANCY 11/01/17 PFTs: FVC: 2.96 > 3.14 L (68 > 72 %pred), FEV1: 0.94 > 0.93 L (27 %pred), FEV1/FVC: 32%, TLC: 9.04 L (137 %pred), DLCO 62 %pred   12/07/17 CTA chest: No pulmonary embolus identified. Anterior left upper lobe ground-glass and  nodular consolidation likely representing pneumonitis. Stable bilateral upper lobe pulmonary nodules. Stable emphysema. 01/21/18 CT chest: There is new masslike architectural distortion and ground-glass attenuation surrounding the index nodule within the right upper lobe. The appearance favors an inflammatory or infectious process. Underlying tumor is considered less favored but not excluded and follow-up imaging with a repeat CT of the chest following appropriate antibiotic therapy is recommended in 3-4 weeks following trial of antibiotic therapy to ensure resolution and exclude underlying malignancy. The index nodule in the left upper lobe is slightly decreased in volume when compared with the previous exam favoring a benign process. Attention on follow-up imaging advise. Advanced changes of emphysema (very severe) 06/01/18 CTA chest: No evidence of pulmonary embolism. Waxing and waning bilateral pulmonary nodular process as described. New 8 mm nodular ill-defined focus of opacification over the lingula which may represent an acute inflammatory or infectious process. No effusion. Emphysema  10/19/18 CTA chest: No acute cardiopulmonary disease and no evidence of pulmonary embolism. Hospitalization 1/12-1/14/2020: COPD exacerbation  INTERVAL: Last seen by me 12/04/18.  At that time, I changed him to nebulized budesonide plus scheduled DuoNeb around-the-clock.   SUBJ: This is a post hospital follow-up.  He was hospitalized as documented above and treated in the usual manner for COPD exacerbation.  He was discharged home and has now completed antibiotics and tapering dose of prednisone.  He believes that he is slowly improving but not back to his former baseline.  He lives with his daughter and son-in-law and still requires significant assistance with ADLs.  He continues to smoke.  However, he tells me that he has not smoked on the day of this encounter and feels committed to smoking cessation.  He remains  profoundly dyspneic with minimal exertion.  He is compliant with oxygen therapy.  He is using DuoNeb.  He is not tolerating nebulized budesonide with the belief that it has caused mouth sores (possible) and constipation (unlikely).  Presently, he denies fever, chest pain, purulent sputum, hemoptysis, lower extremity edema, calf tenderness.  OBJ: Vitals:   01/16/19 1101  BP: (!) 120/92  Pulse: 63  SpO2: 93%  Weight: 139 lb 6.4 oz (63.2 kg)  Height: 5\' 8"  (1.727 m)  3 LPM   EXAM:  In wheelchair with [place] O2 Appears very fatigued HEENT NCAT, sclerae white No JVD, lymphadenopathy Breath sounds markedly diminished with minimal scattered wheezes RRR, no M NABS, NT Warm extremities without clubbing, cyanosis, edema Diffusely weak, no focal deficits  DATA:   BMP Latest Ref Rng & Units 01/01/2019 12/31/2018 12/16/2018  Glucose 70 - 99 mg/dL 139(H) 123(H) 101(H)  BUN 8 - 23 mg/dL 11 8 13   Creatinine 0.61 - 1.24 mg/dL 0.55(L) 0.63 0.58(L)  BUN/Creat Ratio 10 - 24 - - -  Sodium 135 - 145 mmol/L 141 139 141  Potassium 3.5 - 5.1 mmol/L 4.7 3.9 3.8  Chloride 98 - 111 mmol/L 94(L) 91(L) 96(L)  CO2 22 - 32 mmol/L 43(H) 42(H) 38(H)  Calcium 8.9 - 10.3 mg/dL 9.4 8.9 9.1    CBC Latest Ref Rng & Units 01/01/2019 12/31/2018 12/16/2018  WBC 4.0 - 10.5 K/uL 5.4 7.7 6.7  Hemoglobin 13.0 - 17.0 g/dL 11.5(L) 11.3(L) 12.4(L)  Hematocrit 39.0 - 52.0 % 37.1(L) 36.6(L) 39.8  Platelets 150 - 400 K/uL 232 229 280   CXR 1/12: No acute findings  IMPRESSION:   Smoker  Chronic hypoxemic respiratory failure (HCC)  COPD, very severe (HCC)  Recent COPD exacerbation (HCC)   Intolerant to nebulized budesonide  PLAN:  Discontinue nebulized budesonide (Pulmicort) Resume Advair 500/50, 1 inhalation twice a day.  Rinse mouth after use Continue nebulized DuoNeb 4 times per day and as needed Continue oxygen therapy at 2-3 LPM by nasal cannula as close to 24 hours/day as possible New medication: Azithromycin  250 mg every other day.  Prescription entered Continue your efforts at smoking cessation  Follow-up in 6 to 8 weeks.  Call sooner if needed.  I have scheduled a 30-minute slot upon follow-up to address goals of care    Merton Border, MD PCCM service Mobile [phone] Pager [phone] 01/16/2019  12:34 PM

## 2019-01-16 NOTE — Patient Instructions (Addendum)
Discontinue nebulized budesonide (Pulmicort) Resume Advair 500/50, 1 inhalation twice a day.  Rinse mouth after use Continue nebulized DuoNeb 4 times per day and as needed Continue oxygen therapy at 2-3 LPM by nasal cannula as close to 24 hours/day as possible New medication: Azithromycin 250 mg every other day.  Prescription entered Continue your efforts at smoking cessation  Follow-up in 6 to 8 weeks.  Call sooner if needed

## 2019-01-19 ENCOUNTER — Telehealth: Payer: Self-pay

## 2019-01-19 NOTE — Telephone Encounter (Signed)
Opened in error. KW °

## 2019-01-28 ENCOUNTER — Other Ambulatory Visit: Payer: Self-pay

## 2019-01-28 ENCOUNTER — Inpatient Hospital Stay
Admission: EM | Admit: 2019-01-28 | Discharge: 2019-01-31 | DRG: 194 | Disposition: A | Discharge status: Home / Self Care | Payer: Medicare Other | Attending: Internal Medicine | Admitting: Internal Medicine

## 2019-01-28 ENCOUNTER — Emergency Department: Payer: Medicare Other

## 2019-01-28 DIAGNOSIS — Z8719 Personal history of other diseases of the digestive system: Secondary | ICD-10-CM | POA: Diagnosis not present

## 2019-01-28 DIAGNOSIS — Z801 Family history of malignant neoplasm of trachea, bronchus and lung: Secondary | ICD-10-CM

## 2019-01-28 DIAGNOSIS — Z79899 Other long term (current) drug therapy: Secondary | ICD-10-CM

## 2019-01-28 DIAGNOSIS — I10 Essential (primary) hypertension: Secondary | ICD-10-CM | POA: Diagnosis present

## 2019-01-28 DIAGNOSIS — Z833 Family history of diabetes mellitus: Secondary | ICD-10-CM

## 2019-01-28 DIAGNOSIS — J440 Chronic obstructive pulmonary disease with acute lower respiratory infection: Secondary | ICD-10-CM | POA: Diagnosis present

## 2019-01-28 DIAGNOSIS — Z9861 Coronary angioplasty status: Secondary | ICD-10-CM | POA: Diagnosis not present

## 2019-01-28 DIAGNOSIS — J189 Pneumonia, unspecified organism: Principal | ICD-10-CM | POA: Diagnosis present

## 2019-01-28 DIAGNOSIS — Y95 Nosocomial condition: Secondary | ICD-10-CM | POA: Diagnosis present

## 2019-01-28 DIAGNOSIS — Z811 Family history of alcohol abuse and dependence: Secondary | ICD-10-CM

## 2019-01-28 DIAGNOSIS — Z7982 Long term (current) use of aspirin: Secondary | ICD-10-CM

## 2019-01-28 DIAGNOSIS — Z888 Allergy status to other drugs, medicaments and biological substances status: Secondary | ICD-10-CM | POA: Diagnosis not present

## 2019-01-28 DIAGNOSIS — I252 Old myocardial infarction: Secondary | ICD-10-CM | POA: Diagnosis not present

## 2019-01-28 DIAGNOSIS — I5022 Chronic systolic (congestive) heart failure: Secondary | ICD-10-CM | POA: Diagnosis present

## 2019-01-28 DIAGNOSIS — I255 Ischemic cardiomyopathy: Secondary | ICD-10-CM | POA: Diagnosis present

## 2019-01-28 DIAGNOSIS — I2510 Atherosclerotic heart disease of native coronary artery without angina pectoris: Secondary | ICD-10-CM | POA: Diagnosis present

## 2019-01-28 DIAGNOSIS — K219 Gastro-esophageal reflux disease without esophagitis: Secondary | ICD-10-CM | POA: Diagnosis present

## 2019-01-28 DIAGNOSIS — F17210 Nicotine dependence, cigarettes, uncomplicated: Secondary | ICD-10-CM | POA: Diagnosis present

## 2019-01-28 DIAGNOSIS — E785 Hyperlipidemia, unspecified: Secondary | ICD-10-CM | POA: Diagnosis present

## 2019-01-28 DIAGNOSIS — J441 Chronic obstructive pulmonary disease with (acute) exacerbation: Secondary | ICD-10-CM | POA: Diagnosis present

## 2019-01-28 DIAGNOSIS — Z8249 Family history of ischemic heart disease and other diseases of the circulatory system: Secondary | ICD-10-CM | POA: Diagnosis not present

## 2019-01-28 DIAGNOSIS — F419 Anxiety disorder, unspecified: Secondary | ICD-10-CM | POA: Diagnosis present

## 2019-01-28 DIAGNOSIS — I110 Hypertensive heart disease with heart failure: Secondary | ICD-10-CM | POA: Diagnosis present

## 2019-01-28 DIAGNOSIS — Z825 Family history of asthma and other chronic lower respiratory diseases: Secondary | ICD-10-CM

## 2019-01-28 DIAGNOSIS — Z7951 Long term (current) use of inhaled steroids: Secondary | ICD-10-CM | POA: Diagnosis not present

## 2019-01-28 DIAGNOSIS — J181 Lobar pneumonia, unspecified organism: Secondary | ICD-10-CM

## 2019-01-28 DIAGNOSIS — R0602 Shortness of breath: Secondary | ICD-10-CM | POA: Diagnosis not present

## 2019-01-28 DIAGNOSIS — I5032 Chronic diastolic (congestive) heart failure: Secondary | ICD-10-CM | POA: Diagnosis present

## 2019-01-28 DIAGNOSIS — Z8049 Family history of malignant neoplasm of other genital organs: Secondary | ICD-10-CM | POA: Diagnosis not present

## 2019-01-28 DIAGNOSIS — Z8711 Personal history of peptic ulcer disease: Secondary | ICD-10-CM | POA: Diagnosis not present

## 2019-01-28 LAB — COMPREHENSIVE METABOLIC PANEL
ALBUMIN: 3.8 g/dL (ref 3.5–5.0)
ALT: 15 U/L (ref 0–44)
AST: 16 U/L (ref 15–41)
Alkaline Phosphatase: 65 U/L (ref 38–126)
Anion gap: 7 (ref 5–15)
BUN: 12 mg/dL (ref 8–23)
CO2: 38 mmol/L — ABNORMAL HIGH (ref 22–32)
Calcium: 9.4 mg/dL (ref 8.9–10.3)
Chloride: 95 mmol/L — ABNORMAL LOW (ref 98–111)
Creatinine, Ser: 0.61 mg/dL (ref 0.61–1.24)
GFR calc Af Amer: >60 mL/min (ref >60)
GFR calc non Af Amer: >60 mL/min (ref >60)
GLUCOSE: 87 mg/dL (ref 70–99)
Potassium: 4.1 mmol/L (ref 3.5–5.1)
SODIUM: 140 mmol/L (ref 135–145)
Total Bilirubin: 0.4 mg/dL (ref 0.3–1.2)
Total Protein: 6.9 g/dL (ref 6.5–8.1)

## 2019-01-28 LAB — CBC
HCT: 36.3 % — ABNORMAL LOW (ref 39.0–52.0)
Hemoglobin: 11.3 g/dL — ABNORMAL LOW (ref 13.0–17.0)
MCH: 29.2 pg (ref 26.0–34.0)
MCHC: 31.1 g/dL (ref 30.0–36.0)
MCV: 93.8 fL (ref 80.0–100.0)
PLATELETS: 315 10*3/uL (ref 150–400)
RBC: 3.87 MIL/uL — ABNORMAL LOW (ref 4.22–5.81)
RDW: 12.1 % (ref 11.5–15.5)
WBC: 9 10*3/uL (ref 4.0–10.5)
nRBC: 0 % (ref 0.0–0.2)

## 2019-01-28 LAB — TROPONIN I: Troponin I: <0.03 ng/mL (ref <0.03)

## 2019-01-28 IMAGING — DX DG CHEST 1V PORT
2 series · 2 of 2 positions shown · non-contrast
Comparison: [DATE]

CLINICAL DATA: Shortness of breath and chest discomfort

EXAM:
PORTABLE CHEST 1 VIEW

[chest ap (1 of 2)]
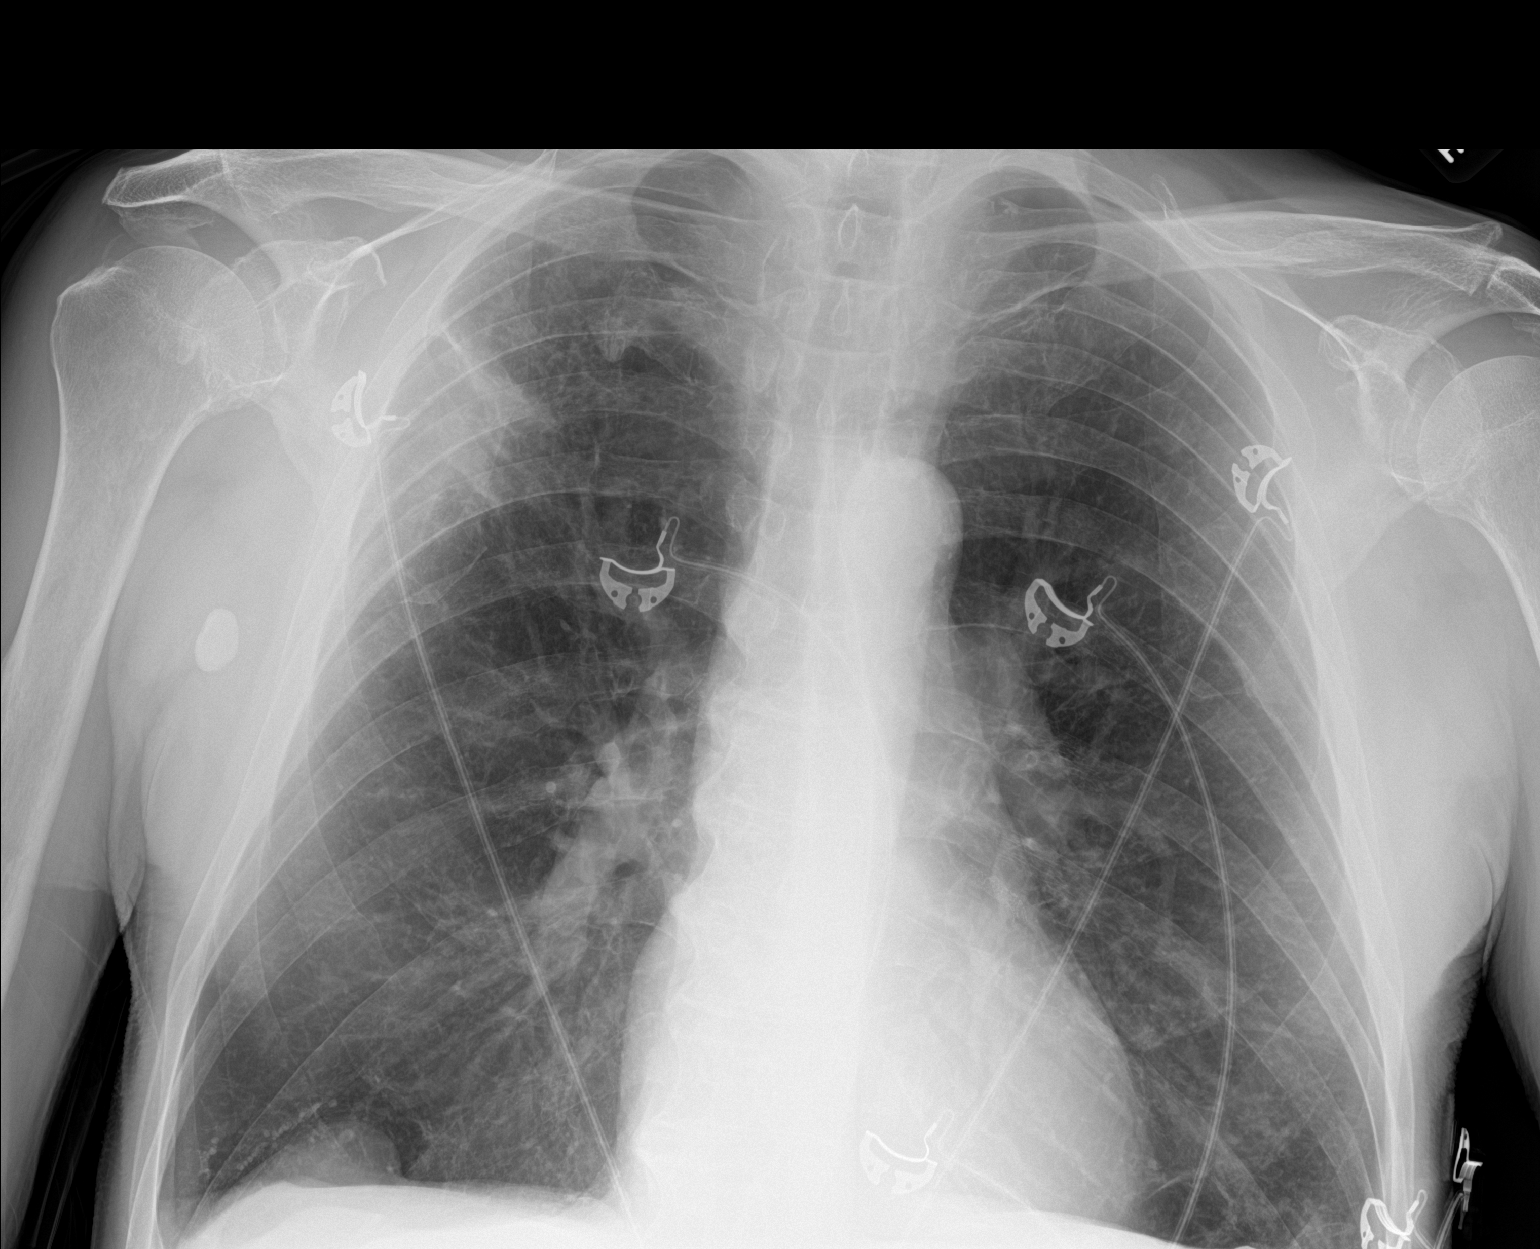

[chest ap (2 of 2)]
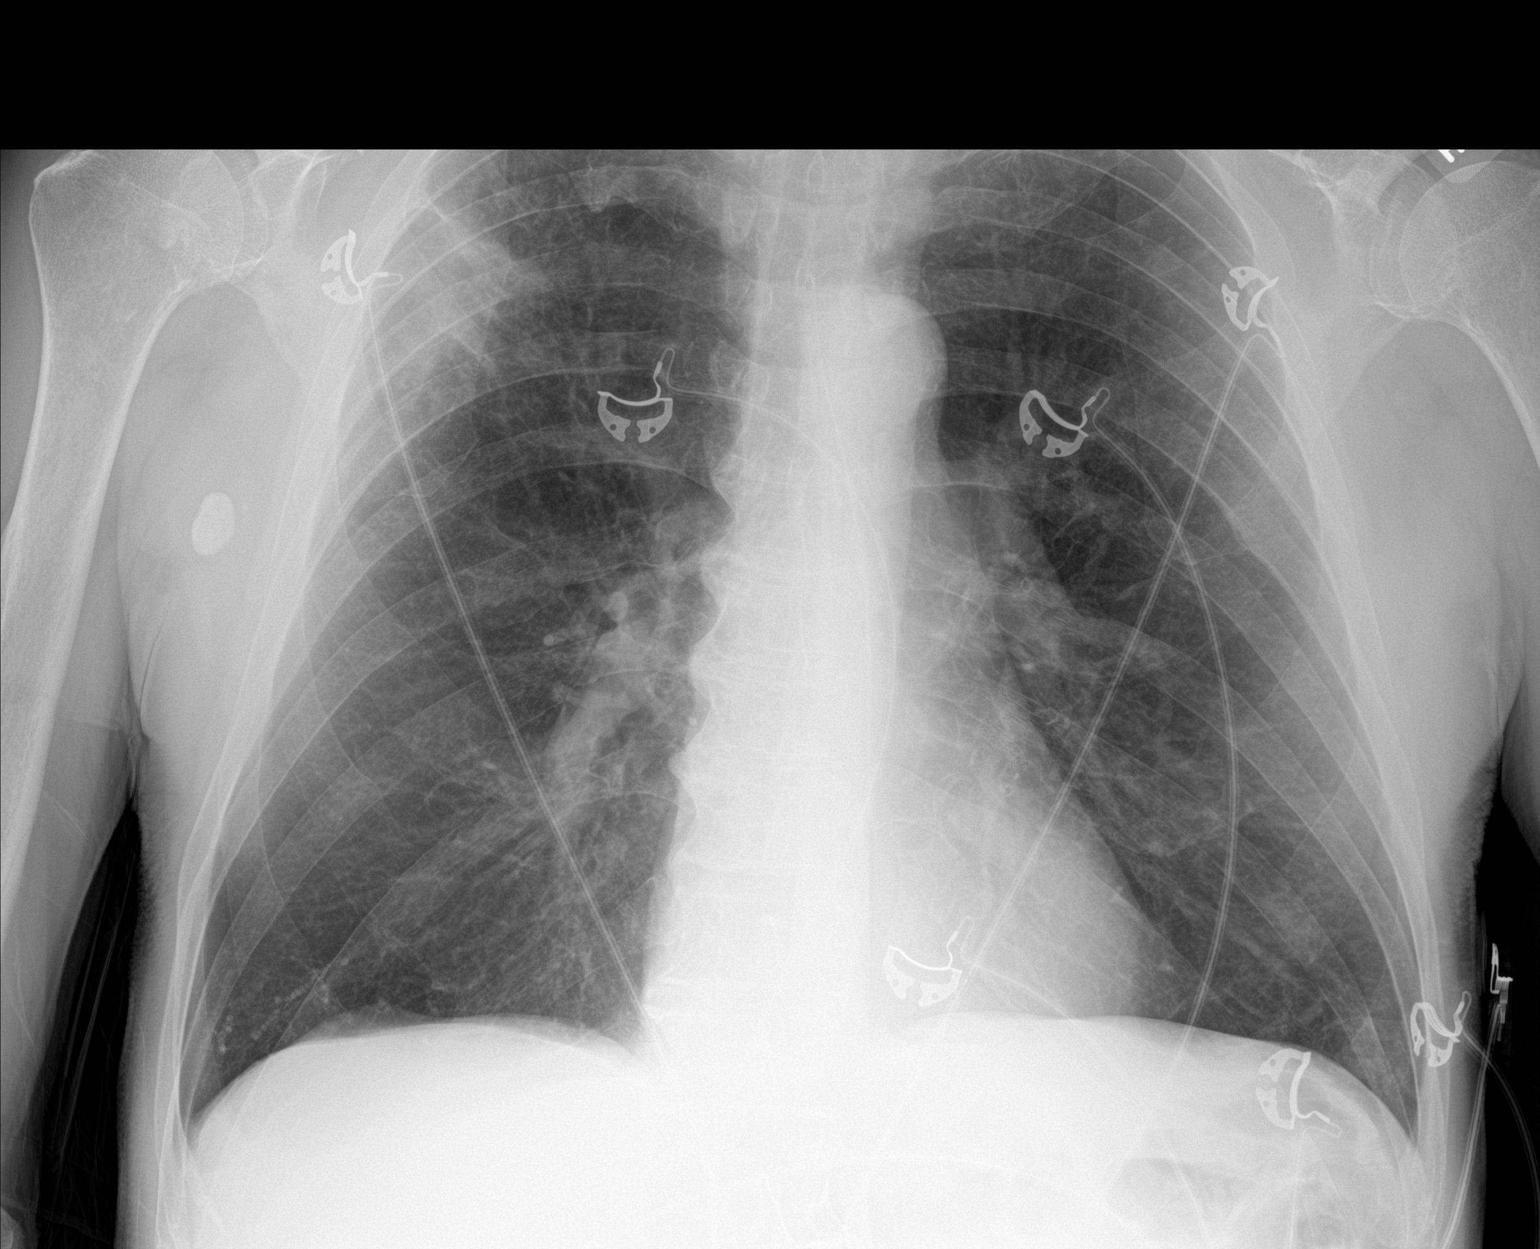

[2 of 2 positions shown; findings below may reference images not displayed]

FINDINGS: Cardiac shadow is stable. The lungs are hyperinflated. Patchy
lateral infiltrative density is noted in the right upper lobe new
from the prior exam consistent with acute infiltrate. No sizable
effusion is noted. Degenerative changes of the thoracic spine are
noted.
IMPRESSION: COPD.

New right upper lobe infiltrate laterally.

Followup PA and lateral chest X-ray is recommended in 3-4 weeks
following trial of antibiotic therapy to ensure resolution and
exclude underlying malignancy.

## 2019-01-28 MED ORDER — LORAZEPAM 1 MG PO TABS
1.0000 mg | ORAL_TABLET | Freq: Once | ORAL | Status: AC
  Administered 2019-01-28: 1 mg via ORAL
  Filled 2019-01-28: qty 1

## 2019-01-28 MED ORDER — SODIUM CHLORIDE 0.9 % IV SOLN: 500.0000 mg | Freq: Once | INTRAVENOUS | Status: DC

## 2019-01-28 MED ORDER — IPRATROPIUM-ALBUTEROL 0.5-2.5 (3) MG/3ML IN SOLN
3.0000 mL | Freq: Once | RESPIRATORY_TRACT | Status: AC
  Administered 2019-01-28: 3 mL via RESPIRATORY_TRACT
  Filled 2019-01-28: qty 3

## 2019-01-28 MED ORDER — SODIUM CHLORIDE 0.9 % IV SOLN
2.0000 g | Freq: Once | INTRAVENOUS | Status: AC
  Administered 2019-01-29: 2 g via INTRAVENOUS
  Filled 2019-01-28 (×2): qty 2

## 2019-01-28 MED ORDER — SODIUM CHLORIDE 0.9 % IV SOLN: 1.0000 g | Freq: Once | INTRAVENOUS | Status: DC

## 2019-01-28 MED ORDER — PROMETHAZINE HCL 25 MG/ML IJ SOLN: 25.0000 mg | Freq: Once | INTRAMUSCULAR | Status: DC

## 2019-01-28 MED ORDER — VANCOMYCIN HCL 10 G IV SOLR
1250.0000 mg | Freq: Once | INTRAVENOUS | Status: AC
  Administered 2019-01-29: 1250 mg via INTRAVENOUS
  Filled 2019-01-28: qty 1250

## 2019-01-28 MED ORDER — METHYLPREDNISOLONE SODIUM SUCC 125 MG IJ SOLR
125.0000 mg | Freq: Once | INTRAMUSCULAR | Status: AC
  Administered 2019-01-28: 125 mg via INTRAVENOUS
  Filled 2019-01-28: qty 2

## 2019-01-28 NOTE — ED Triage Notes (Signed)
Pt arrived via [facility] from home with SOB and discomfort in his chest (and took maalox) with some relief. Pt recieved one albuterol treatment on arriva. Pt hx of COPD, empysema, and possible CHF. Pt also states that he took lasix but does not remember the dosage. Pt oxygen sat 94% on RA and 100% on 2L. Pt NAD on arrival.

## 2019-01-28 NOTE — H&P (Signed)
[facility] NAME: Leslie Sims    MR#:  [id number]  DATE OF BIRTH:  1951-10-20  DATE OF ADMISSION:  01/28/2019  PRIMARY CARE PHYSICIAN: [name], MD   REQUESTING/REFERRING PHYSICIAN: Kerman Passey, MD  CHIEF COMPLAINT:   Chief Complaint  Patient presents with  . Shortness of Breath    HISTORY OF PRESENT ILLNESS:  Leslie Sims  is a 68 y.o. male who presents with chief complaint as above.  He states that he has been having some progressive shortness of breath, with cough and some sputum production for the past several days.  His symptoms got acutely worse today with significant wheezing.  He came to the ED for evaluation.  He was recently admitted here in the hospital for COPD exacerbation.  In the ED today he was found to have right upper lobe pneumonia.  Hospitalist were called for admission  PAST MEDICAL HISTORY:   Past Medical History:  Diagnosis Date  . Allergic rhinitis   . Anxiety   . Back pain   . Colon polyps 08/2012   colonoscopy; multiple colon polyps; repeat colonoscopy in one year.  [name] COPD (chronic obstructive pulmonary disease) ([place])   . Coronary artery disease 11/2009   a. late presenting ant MI; b. LHC 100% pLAD s/p PCI/DES, 99% mRCA s/p PCI/DES, EF 35%; c. nuclear stress test 05/13: prior ant/inf infarcts w/o ischemia, EF 43%; d. LHC 02/14: widely patent stents with no other obs dz, EF 40%; e. LHC 11/17: LM nl, mLAD 10%, patent LAD stent, dLAD 20%, p-mRCA 10%, mRCA 40%, patent RCA stent, EF 35-45%   . Depression   . GERD (gastroesophageal reflux disease)   . Headache(784.0)   . Helicobacter pylori (H. pylori)   . Hemorrhoids   . Hernia    inguinal  . Hyperlipidemia   . Hypertension   . Insomnia   . Ischemic cardiomyopathy   . Myalgia   . Peptic ulcer   . Pulmonary nodules   . ST elevation (STEMI) myocardial infarction involving left anterior descending coronary artery ([place]) 11/2009    a. s/p PCI to the LAD  . Status post dilation of esophageal narrowing 2000  . Tinea pedis   . Wears dentures    partial upper     PAST SURGICAL HISTORY:   Past Surgical History:  Procedure Laterality Date  . Admission  12/20/2012   COPD exacerbation.  [place].  [name] CARDIAC CATHETERIZATION    . CARDIAC CATHETERIZATION  02/05/13   [facility]  . CARDIAC CATHETERIZATION  10/14   [facility] : patent stents with no change in anatomy. EF: 40$  . CARDIAC CATHETERIZATION Left 11/12/2016   Procedure: Left Heart Cath and Coronary Angiography;  Surgeon: Wellington Hampshire, MD;  Location: [place] [facility];  Service: Cardiovascular;  Laterality: Left;  . COLONOSCOPY    . COLONOSCOPY WITH PROPOFOL N/A 05/18/2016   Procedure: COLONOSCOPY WITH PROPOFOL;  Surgeon: Lucilla Lame, MD;  Location: [facility] ENDOSCOPY;  Service: Endoscopy;  Laterality: N/A;  . CORONARY ANGIOPLASTY WITH STENT PLACEMENT  09/2009   LAD 3.0 X23 mm Xience DES, RCA: 4.0 X 15 mm Xience DES  . ELECTROMAGNETIC NAVIGATION BROCHOSCOPY N/A 10/27/2017   Procedure: ELECTROMAGNETIC NAVIGATION BRONCHOSCOPY;  Surgeon: Flora Lipps, MD;  Location: [facility] ORS;  Service: Cardiopulmonary;  Laterality: N/A;  . ESOPHAGEAL DILATION    . ESOPHAGOGASTRODUODENOSCOPY  2008  . ESOPHAGOGASTRODUODENOSCOPY  08/20/2012  . HERNIA REPAIR  07/20/2012   L inguinal hernia  repair  . PENILE PROSTHESIS IMPLANT       SOCIAL HISTORY:   Social History   Tobacco Use  . Smoking status: Current Every Day Smoker    Packs/day: 1.00    Years: 42.00    Pack years: 42.00    Types: Cigarettes  . Smokeless tobacco: Never Used  Substance Use Topics  . Alcohol use: No    Alcohol/week: 0.0 standard drinks     FAMILY HISTORY:   Family History  Problem Relation Age of Onset  . Heart attack Brother        Brother #1  . Diabetes Brother   . Hypertension Brother        #3  . Coronary artery disease Father 77       deceased  . Heart attack Father   . Diabetes Father   . Heart disease  Father   . COPD Mother 83       deceased  . Alcohol abuse Sister        polysubstance abuse  . COPD Sister   . Lung cancer Sister   . Alcohol abuse Sister        polysubstance abuse  . Penile cancer Brother   . Diabetes Brother      DRUG ALLERGIES:   Allergies  Allergen Reactions  . Prozac [Fluoxetine Hcl] Shortness Of Breath  . Effexor Xr [Venlafaxine Hcl Er] Other (See Comments)    "Makes me feel funny"  . Wellbutrin [Bupropion] Other (See Comments)    "Makes me feel funny"    MEDICATIONS AT HOME:   Prior to Admission medications   Medication Sig Start Date End Date Taking? Authorizing Provider  acetaminophen (TYLENOL) 325 MG tablet Take 2 tablets (650 mg total) by mouth every 6 (six) hours as needed for mild pain (or Fever >/= 101). 08/26/18   Gouru, Aruna, MD  albuterol (PROVENTIL HFA;VENTOLIN HFA) 108 (90 Base) MCG/ACT inhaler Inhale 2 puffs into the lungs every 6 (six) hours as needed for wheezing or shortness of breath. 11/01/18   Rudene Re, MD  ARIPiprazole (ABILIFY) 5 MG tablet Take 5 mg by mouth daily. 07/26/18   [provider]  aspirin 81 MG tablet Take 81 mg by mouth daily.      [provider]  atorvastatin (LIPITOR) 40 MG tablet Take 1 tablet (40 mg total) by mouth daily. 03/03/18   [name], MD  azithromycin (ZITHROMAX) 250 MG tablet Take 1 tablet (250 mg total) by mouth every other day. 01/16/19   Wilhelmina Mcardle, MD  DULoxetine (CYMBALTA) 30 MG capsule Take 30 mg by mouth daily.     [provider]  feeding supplement, ENSURE ENLIVE, (ENSURE ENLIVE) LIQD Take 237 mLs by mouth 3 (three) times daily between meals. 10/01/18   Epifanio Lesches, MD  Fluticasone-Salmeterol (ADVAIR) 500-50 MCG/DOSE AEPB Inhale 1 puff into the lungs every 12 (twelve) hours. 11/29/18   [provider]  furosemide (LASIX) 20 MG tablet Take 1 tablet (20 mg total) by mouth daily. 07/07/18 09/30/19  [name], MD  hydrOXYzine  (VISTARIL) 25 MG capsule Take 25 mg by mouth 2 (two) times daily as needed for anxiety or itching.  06/05/18   [provider]  ipratropium-albuterol (DUONEB) 0.5-2.5 (3) MG/3ML SOLN Take 3 mLs by nebulization 4 (four) times daily. DX:J44.9 12/04/18   Wilhelmina Mcardle, MD  LORazepam (ATIVAN) 1 MG tablet Take 1 tablet (1 mg total) by mouth every 8 (eight) hours as needed for  anxiety. 07/07/18   [name], MD  losartan (COZAAR) 25 MG tablet TAKE 1 TABLET DAILY Patient taking differently: Take 25 mg by mouth daily.  01/27/18   Minna Merritts, MD  mirtazapine (REMERON) 30 MG tablet Take 1 tablet (30 mg total) by mouth at bedtime. 06/24/18   [name], MD  Multiple Vitamin (MULTIVITAMIN) capsule Take 1 capsule by mouth daily.    [provider]  nitroGLYCERIN (NITROSTAT) 0.4 MG SL tablet Place 1 tablet (0.4 mg total) under the tongue every 5 (five) minutes as needed. 10/17/17   Wellington Hampshire, MD  sertraline (ZOLOFT) 100 MG tablet Take 100 mg by mouth daily.  06/28/18   [provider]  sucralfate (CARAFATE) 1 GM/10ML suspension Take 10 mLs (1 g total) by mouth 4 (four) times daily -  with meals and at bedtime. Patient taking differently: Take 1 g by mouth 4 (four) times daily as needed.  05/16/18   [name], MD    REVIEW OF SYSTEMS:  Review of Systems  Constitutional: Negative for chills, fever, malaise/fatigue and weight loss.  HENT: Negative for ear pain, hearing loss and tinnitus.   Eyes: Negative for blurred vision, double vision, pain and redness.  Respiratory: Positive for cough, sputum production, shortness of breath and wheezing. Negative for hemoptysis.   Cardiovascular: Negative for chest pain, palpitations, orthopnea and leg swelling.  Gastrointestinal: Negative for abdominal pain, constipation, diarrhea, nausea and vomiting.  Genitourinary: Negative for dysuria, frequency and hematuria.  Musculoskeletal: Negative for back pain, joint pain and  neck pain.  Skin:       No acne, rash, or lesions  Neurological: Negative for dizziness, tremors, focal weakness and weakness.  Endo/Heme/Allergies: Negative for polydipsia. Does not bruise/bleed easily.  Psychiatric/Behavioral: Negative for depression. The patient is not nervous/anxious and does not have insomnia.      VITAL SIGNS:   Vitals:   01/28/19 2133 01/28/19 2134 01/28/19 2137  BP: 108/75 108/75   Pulse: 76    Resp: 18 (!) 23   Temp: 98 F (36.7 C)    SpO2: 96%    Weight:   63.2 kg  Height:   5\' 8"  (1.727 m)   Wt Readings from Last 3 Encounters:  01/28/19 63.2 kg  01/16/19 63.2 kg  12/31/18 63.5 kg    PHYSICAL EXAMINATION:  Physical Exam  Vitals reviewed. Constitutional: He is oriented to person, place, and time. He appears well-developed and well-nourished. No distress.  HENT:  Head: Normocephalic and atraumatic.  Mouth/Throat: Oropharynx is clear and moist.  Eyes: Pupils are equal, round, and reactive to light. Conjunctivae and EOM are normal. No scleral icterus.  Neck: Normal range of motion. Neck supple. No JVD present. No thyromegaly present.  Cardiovascular: Normal rate, regular rhythm and intact distal pulses. Exam reveals no gallop and no friction rub.  No murmur heard. Respiratory: Effort normal. No respiratory distress. He has wheezes. He has no rales.  GI: Soft. Bowel sounds are normal. He exhibits no distension. There is no abdominal tenderness.  Musculoskeletal: Normal range of motion.        General: No edema.     Comments: No arthritis, no gout  Lymphadenopathy:    He has no cervical adenopathy.  Neurological: He is alert and oriented to person, place, and time. No cranial nerve deficit.  No dysarthria, no aphasia  Skin: Skin is warm and dry. No rash noted. No erythema.  Psychiatric: He has a normal mood and affect. His  behavior is normal. Judgment and thought content normal.    LABORATORY PANEL:   CBC Recent Labs  Lab 01/28/19 2134   WBC 9.0  HGB 11.3*  HCT 36.3*  PLT 315   ------------------------------------------------------------------------------------------------------------------  Chemistries  Recent Labs  Lab 01/28/19 2134  NA 140  K 4.1  CL 95*  CO2 38*  GLUCOSE 87  BUN 12  CREATININE 0.61  CALCIUM 9.4  AST 16  ALT 15  ALKPHOS 65  BILITOT 0.4   ------------------------------------------------------------------------------------------------------------------  Cardiac Enzymes Recent Labs  Lab 01/28/19 2134  TROPONINI <0.03   ------------------------------------------------------------------------------------------------------------------  RADIOLOGY:  Dg Chest Portable 1 View  Result Date: 01/28/2019 CLINICAL DATA:  Shortness of breath and chest discomfort EXAM: PORTABLE CHEST 1 VIEW COMPARISON:  12/31/2018 FINDINGS: Cardiac shadow is stable. The lungs are hyperinflated. Patchy lateral infiltrative density is noted in the right upper lobe new from the prior exam consistent with acute infiltrate. No sizable effusion is noted. Degenerative changes of the thoracic spine are noted. IMPRESSION: COPD. New right upper lobe infiltrate laterally. Followup PA and lateral chest X-ray is recommended in 3-4 weeks following trial of antibiotic therapy to ensure resolution and exclude underlying malignancy. Electronically Signed   By: Inez Catalina M.D.   On: 01/28/2019 22:10    EKG:   Orders placed or performed during the hospital encounter of 01/28/19  . EKG 12-Lead  . EKG 12-Lead    IMPRESSION AND PLAN:  Principal Problem:   HCAP (healthcare-associated pneumonia) -IV antibiotics initiated, PRN duo nebs and antitussive Active Problems:   COPD with acute exacerbation (HCC) -IV Solu-Medrol, antibiotics as above, other supportive treatment as above, continue home meds   Essential hypertension -continue home meds   CAD, NATIVE VESSEL -continue home medications   Chronic systolic heart failure ([place])  -continue home meds   Hyperlipidemia -home dose antilipid  Chart review performed and case discussed with ED provider. Labs, imaging and/or ECG reviewed by provider and discussed with patient/family. Management plans discussed with the patient and/or family.  DVT PROPHYLAXIS: SubQ lovenox   GI PROPHYLAXIS:  None  ADMISSION STATUS: Inpatient     CODE STATUS: Full Code Status History    Date Active Date Inactive Code Status Order ID Comments User Context   12/31/2018 1909 01/02/2019 1808 Full Code [id number]  [name], MD ED   10/01/2018 1243 10/01/2018 1759 Full Code [id number]  Lucious Groves, RN Inpatient   09/13/2018 1949 09/15/2018 1719 Full Code [id number]  Loletha Grayer, MD ED   08/25/2018 0140 08/26/2018 1737 Full Code [id number]  Harrie Foreman, MD Inpatient   07/02/2018 1903 07/03/2018 2216 Full Code [id number]  Hillary Bow, MD ED   12/26/2017 0129 12/30/2017 2106 Full Code [id number]  Amelia Jo, MD Inpatient      TOTAL TIME TAKING CARE OF THIS PATIENT: 45 minutes.   Ethlyn Daniels 01/28/2019, 11:34 PM  [facility]  Office  [phone]  CC: Primary care physician; [name], MD  Note:  This document was prepared using Dragon voice recognition software and may include unintentional dictation errors.

## 2019-01-28 NOTE — ED Provider Notes (Signed)
[facility] Emergency Department Provider Note  Time seen: 9:38 PM  I have reviewed the triage vital signs and the nursing notes.   HISTORY  Chief Complaint Shortness of Breath    HPI Leslie Sims is a 68 y.o. male with a past medical history of anxiety, COPD, CAD, gastric reflux, MI, presents to the emergency department for shortness of breath.  According to the patient for the past 4 days he has had progressive worsening shortness of breath.  States some cough but states this is chronic.  Denies any increased sputum production.  Denies any fever.  Denies any chest pain.  No leg pain or swelling.  Describes her shortness of breath is moderate although somewhat improved after breathing treatment by EMS.   Past Medical History:  Diagnosis Date  . Allergic rhinitis   . Anxiety   . Back pain   . Colon polyps 08/2012   colonoscopy; multiple colon polyps; repeat colonoscopy in one year.  [name] COPD (chronic obstructive pulmonary disease) ([place])   . Coronary artery disease 11/2009   a. late presenting ant MI; b. LHC 100% pLAD s/p PCI/DES, 99% mRCA s/p PCI/DES, EF 35%; c. nuclear stress test 05/13: prior ant/inf infarcts w/o ischemia, EF 43%; d. LHC 02/14: widely patent stents with no other obs dz, EF 40%; e. LHC 11/17: LM nl, mLAD 10%, patent LAD stent, dLAD 20%, p-mRCA 10%, mRCA 40%, patent RCA stent, EF 35-45%   . Depression   . GERD (gastroesophageal reflux disease)   . Headache(784.0)   . Helicobacter pylori (H. pylori)   . Hemorrhoids   . Hernia    inguinal  . Hyperlipidemia   . Hypertension   . Insomnia   . Ischemic cardiomyopathy   . Myalgia   . Peptic ulcer   . Pulmonary nodules   . ST elevation (STEMI) myocardial infarction involving left anterior descending coronary artery ([place]) 11/2009   a. s/p PCI to the LAD  . Status post dilation of esophageal narrowing 2000  . Tinea pedis   . Wears dentures    partial upper    Patient Active Problem  List   Diagnosis Date Noted  . COPD with acute exacerbation ([place]) 12/31/2018  . Acute respiratory failure with hypoxia ([place]) 09/29/2018  . Acute respiratory failure with hypoxia and hypercapnia ([place]) 09/13/2018  . Acute on chronic respiratory failure with hypoxia and hypercapnia ([place]) 08/24/2018  . Chest pain 07/02/2018  . Grief reaction 01/13/2018  . COPD with hypoxia ([place]) 12/25/2017  . Unstable angina ([place])   . Benign neoplasm of ascending colon   . Benign neoplasm of descending colon   . Benign neoplasm of sigmoid colon   . Personal history of colonic polyps   . Pulmonary nodules/lesions, multiple 01/05/2015  . Bruit of left carotid artery 11/04/2014  . Sleep disorder 07/18/2013  . Seasonal and perennial allergic rhinitis 05/29/2013  . Bradycardia 04/20/2011  . SMOKER 07/30/2010  . CAD, NATIVE VESSEL 04/23/2010  . Hyperlipidemia 03/24/2010  . DEPRESSION/ANXIETY 03/24/2010  . Chronic systolic heart failure ([place]) 03/24/2010  . COPD, very severe ([place]) 03/24/2010  . GERD 03/24/2010  . Essential hypertension 11/21/2009    Past Surgical History:  Procedure Laterality Date  . Admission  12/20/2012   COPD exacerbation.  [place].  [name] CARDIAC CATHETERIZATION    . CARDIAC CATHETERIZATION  02/05/13   [facility]  . CARDIAC CATHETERIZATION  10/14   [facility] : patent stents with no change in anatomy. EF: 40$  . CARDIAC  CATHETERIZATION Left 11/12/2016   Procedure: Left Heart Cath and Coronary Angiography;  Surgeon: Wellington Hampshire, MD;  Location: [place] [facility];  Service: Cardiovascular;  Laterality: Left;  . COLONOSCOPY    . COLONOSCOPY WITH PROPOFOL N/A 05/18/2016   Procedure: COLONOSCOPY WITH PROPOFOL;  Surgeon: Lucilla Lame, MD;  Location: [facility] END[facility]COPY;  Service: Endoscopy;  Laterality: N/A;  . CORONARY ANGIOPLASTY WITH STENT PLACEMENT  09/2009   LAD 3.0 X23 mm Xience DES, RCA: 4.0 X 15 mm Xience DES  . ELECTROMAGNETIC NAVIGATION BROCHOSCOPY N/A 10/27/2017   Procedure: ELECTROMAGNETIC  NAVIGATION BRONCHOSCOPY;  Surgeon: Flora Lipps, MD;  Location: [facility] ORS;  Service: Cardiopulmonary;  Laterality: N/A;  . ESOPHAGEAL DILATION    . ESOPHAGOGASTRODUODENOSCOPY  2008  . ESOPHAGOGASTRODUODENOSCOPY  08/20/2012  . HERNIA REPAIR  07/20/2012   L inguinal hernia repair  . PENILE PROSTHESIS IMPLANT      Prior to Admission medications   Medication Sig Start Date End Date Taking? Authorizing Provider  acetaminophen (TYLENOL) 325 MG tablet Take 2 tablets (650 mg total) by mouth every 6 (six) hours as needed for mild pain (or Fever >/= 101). 08/26/18   Gouru, Aruna, MD  albuterol (PROVENTIL HFA;VENTOLIN HFA) 108 (90 Base) MCG/ACT inhaler Inhale 2 puffs into the lungs every 6 (six) hours as needed for wheezing or shortness of breath. 11/01/18   Rudene Re, MD  ARIPiprazole (ABILIFY) 5 MG tablet Take 5 mg by mouth daily. 07/26/18   [provider]  aspirin 81 MG tablet Take 81 mg by mouth daily.      [provider]  atorvastatin (LIPITOR) 40 MG tablet Take 1 tablet (40 mg total) by mouth daily. 03/03/18   [name], MD  azithromycin (ZITHROMAX) 250 MG tablet Take 1 tablet (250 mg total) by mouth every other day. 01/16/19   Wilhelmina Mcardle, MD  DULoxetine (CYMBALTA) 30 MG capsule Take 30 mg by mouth daily.     [provider]  feeding supplement, ENSURE ENLIVE, (ENSURE ENLIVE) LIQD Take 237 mLs by mouth 3 (three) times daily between meals. 10/01/18   Epifanio Lesches, MD  Fluticasone-Salmeterol (ADVAIR) 500-50 MCG/DOSE AEPB Inhale 1 puff into the lungs every 12 (twelve) hours. 11/29/18   [provider]  furosemide (LASIX) 20 MG tablet Take 1 tablet (20 mg total) by mouth daily. 07/07/18 09/30/19  [name], MD  hydrOXYzine (VISTARIL) 25 MG capsule Take 25 mg by mouth 2 (two) times daily as needed for anxiety or itching.  06/05/18   [provider]  ipratropium-albuterol (DUONEB) 0.5-2.5 (3) MG/3ML SOLN Take 3 mLs by nebulization 4  (four) times daily. DX:J44.9 12/04/18   Wilhelmina Mcardle, MD  LORazepam (ATIVAN) 1 MG tablet Take 1 tablet (1 mg total) by mouth every 8 (eight) hours as needed for anxiety. 07/07/18   [name], MD  losartan (COZAAR) 25 MG tablet TAKE 1 TABLET DAILY Patient taking differently: Take 25 mg by mouth daily.  01/27/18   Minna Merritts, MD  mirtazapine (REMERON) 30 MG tablet Take 1 tablet (30 mg total) by mouth at bedtime. 06/24/18   [name], MD  Multiple Vitamin (MULTIVITAMIN) capsule Take 1 capsule by mouth daily.    [provider]  nitroGLYCERIN (NITROSTAT) 0.4 MG SL tablet Place 1 tablet (0.4 mg total) under the tongue every 5 (five) minutes as needed. 10/17/17   Wellington Hampshire, MD  sertraline (ZOLOFT) 100 MG tablet Take 100 mg by mouth daily.  06/28/18  [provider]  sucralfate (CARAFATE) 1 GM/10ML suspension Take 10 mLs (1 g total) by mouth 4 (four) times daily -  with meals and at bedtime. Patient taking differently: Take 1 g by mouth 4 (four) times daily as needed.  05/16/18   [name], MD    Allergies  Allergen Reactions  . Prozac [Fluoxetine Hcl] Shortness Of Breath  . Effexor Xr [Venlafaxine Hcl Er] Other (See Comments)    "Makes me feel funny"  . Wellbutrin [Bupropion] Other (See Comments)    "Makes me feel funny"    Family History  Problem Relation Age of Onset  . Heart attack Brother        Brother #1  . Diabetes Brother   . Hypertension Brother        #3  . Coronary artery disease Father 72       deceased  . Heart attack Father   . Diabetes Father   . Heart disease Father   . COPD Mother 28       deceased  . Alcohol abuse Sister        polysubstance abuse  . COPD Sister   . Lung cancer Sister   . Alcohol abuse Sister        polysubstance abuse  . Penile cancer Brother   . Diabetes Brother     Social History Social History   Tobacco Use  . Smoking status: Current Every Day Smoker    Packs/day: 1.00    Years: 42.00     Pack years: 42.00    Types: Cigarettes  . Smokeless tobacco: Never Used  Substance Use Topics  . Alcohol use: No    Alcohol/week: 0.0 standard drinks  . Drug use: No    Review of Systems Constitutional: Negative for fever. Cardiovascular: Negative for chest pain. Respiratory: Positive for shortness of breath x4 days.  Occasional cough, chronic. Gastrointestinal: Negative for abdominal pain, vomiting Musculoskeletal: Negative for leg pain or swelling Skin: Negative for skin complaints  Neurological: Negative for headache All other ROS negative  ____________________________________________   PHYSICAL EXAM:  VITAL SIGNS: ED Triage Vitals  Enc Vitals Group     BP 01/28/19 2133 108/75     Pulse Rate 01/28/19 2133 76     Resp 01/28/19 2133 18     Temp 01/28/19 2133 98 F (36.7 C)     Temp src --      SpO2 01/28/19 2133 96 %     Weight 01/28/19 2137 139 lb 6.4 oz (63.2 kg)     Height 01/28/19 2137 5\' 8"  (1.727 m)     Head Circumference --      Peak Flow --      Pain Score 01/28/19 2137 0     Pain Loc --      Pain Edu? --      Excl. in [place]? --    Constitutional: Alert and oriented. Well appearing and in no distress. Eyes: Normal exam ENT   Head: Normocephalic and atraumatic.   Mouth/Throat: Mucous membranes are moist. Cardiovascular: Normal rate, regular rhythm. No murmur Respiratory: Mild tachypnea, patient does have fairly diminished breath sounds bilaterally with very slight expiratory wheeze.  No obvious rales or rhonchi. Gastrointestinal: Soft and nontender. No distention.  Musculoskeletal: Nontender with normal range of motion in all extremities. No lower extremity tenderness or edema. Neurologic:  Normal speech and language. No gross focal neurologic deficits  Skin:  Skin is warm, dry and intact.  Psychiatric: Mood  and affect are normal.  ____________________________________________    EKG  EKG viewed and interpreted by myself shows a normal sinus  rhythm at 76 bpm with a narrow QRS, normal axis, normal intervals, nonspecific ST changes, some interference due to breathing pattern.  ____________________________________________    RADIOLOGY  Right upper lobe pneumonia  ____________________________________________   INITIAL IMPRESSION / ASSESSMENT AND PLAN / ED COURSE  Pertinent labs & imaging results that were available during my care of the patient were reviewed by me and considered in my medical decision making (see chart for details).  Patient presents to the emergency department for shortness of breath progressively worsening over the past 4 days.  Denies any chest pain.  Denies any fever.  No leg pain or swelling.  Patient has a history of COPD wears 3.5 L of oxygen 24/7.  Currently on 2 L satting 96%.  Given the diminished breath sounds bilaterally with mild expiratory wheeze we will treat with duo nebs, Solu-Medrol.  We will check labs including cardiac enzymes as the patient has a history of CAD and stents.  We will update a chest x-ray to rule out pneumonia or pneumothorax and we will continue to closely monitor.  Patient does appear somewhat anxious as well we will dose a low dose oral Ativan as well.  Patient continues to feel short of breath.  Chest x-ray consistent with right upper lobe pneumonia.  Desats to 89% on his typical 3 L of oxygen.  Placed on 4 L of oxygen patient satting in the low 90s.  We will check blood culture start on antibiotics and admit to the hospitalist service.  Patient agreeable to plan of care.  ____________________________________________   FINAL CLINICAL IMPRESSION(S) / ED DIAGNOSES  Dyspnea COPD exacerbation Community-acquired pneumonia   Harvest Dark, MD 01/28/19 2316

## 2019-01-29 LAB — BASIC METABOLIC PANEL
Anion gap: 5 (ref 5–15)
BUN: 15 mg/dL (ref 8–23)
CO2: 37 mmol/L — ABNORMAL HIGH (ref 22–32)
Calcium: 8.9 mg/dL (ref 8.9–10.3)
Chloride: 96 mmol/L — ABNORMAL LOW (ref 98–111)
Creatinine, Ser: 0.7 mg/dL (ref 0.61–1.24)
Glucose, Bld: 276 mg/dL — ABNORMAL HIGH (ref 70–99)
Potassium: 4 mmol/L (ref 3.5–5.1)
Sodium: 138 mmol/L (ref 135–145)

## 2019-01-29 LAB — INFLUENZA PANEL BY PCR (TYPE A & B)
Influenza A By PCR: NEGATIVE
Influenza B By PCR: NEGATIVE

## 2019-01-29 LAB — CBC
HCT: 33.1 % — ABNORMAL LOW (ref 39.0–52.0)
Hemoglobin: 10.3 g/dL — ABNORMAL LOW (ref 13.0–17.0)
MCH: 29.3 pg (ref 26.0–34.0)
MCHC: 31.1 g/dL (ref 30.0–36.0)
MCV: 94 fL (ref 80.0–100.0)
Platelets: 285 10*3/uL (ref 150–400)
RBC: 3.52 MIL/uL — ABNORMAL LOW (ref 4.22–5.81)
RDW: 11.9 % (ref 11.5–15.5)
WBC: 8 10*3/uL (ref 4.0–10.5)
nRBC: 0 % (ref 0.0–0.2)

## 2019-01-29 LAB — MRSA PCR SCREENING: MRSA by PCR: NEGATIVE

## 2019-01-29 MED ORDER — BENZONATATE 100 MG PO CAPS: 200.0000 mg | ORAL_CAPSULE | Freq: Three times a day (TID) | ORAL | Status: DC | PRN

## 2019-01-29 MED ORDER — LORAZEPAM 1 MG PO TABS
1.0000 mg | ORAL_TABLET | Freq: Three times a day (TID) | ORAL | Status: DC | PRN
  Administered 2019-01-29 – 2019-01-31 (×4): 1 mg via ORAL
  Filled 2019-01-29 (×4): qty 1

## 2019-01-29 MED ORDER — ENOXAPARIN SODIUM 40 MG/0.4ML ~~LOC~~ SOLN
40.0000 mg | SUBCUTANEOUS | Status: DC
  Administered 2019-01-29 – 2019-01-30 (×2): 40 mg via SUBCUTANEOUS
  Filled 2019-01-29 (×2): qty 0.4

## 2019-01-29 MED ORDER — GUAIFENESIN-DM 100-10 MG/5ML PO SYRP: 5.0000 mL | ORAL_SOLUTION | ORAL | Status: DC | PRN

## 2019-01-29 MED ORDER — FUROSEMIDE 20 MG PO TABS
20.0000 mg | ORAL_TABLET | Freq: Every day | ORAL | Status: DC
  Administered 2019-01-29 – 2019-01-31 (×3): 20 mg via ORAL
  Filled 2019-01-29 (×3): qty 1

## 2019-01-29 MED ORDER — ASPIRIN EC 81 MG PO TBEC
81.0000 mg | DELAYED_RELEASE_TABLET | Freq: Every day | ORAL | Status: DC
  Administered 2019-01-29 – 2019-01-31 (×3): 81 mg via ORAL
  Filled 2019-01-29 (×3): qty 1

## 2019-01-29 MED ORDER — MIRTAZAPINE 15 MG PO TABS
30.0000 mg | ORAL_TABLET | Freq: Every day | ORAL | Status: DC
  Administered 2019-01-29 – 2019-01-30 (×3): 30 mg via ORAL
  Filled 2019-01-29 (×3): qty 2

## 2019-01-29 MED ORDER — ATORVASTATIN CALCIUM 20 MG PO TABS
40.0000 mg | ORAL_TABLET | Freq: Every day | ORAL | Status: DC
  Administered 2019-01-29 – 2019-01-31 (×3): 40 mg via ORAL
  Filled 2019-01-29 (×3): qty 2

## 2019-01-29 MED ORDER — MOMETASONE FURO-FORMOTEROL FUM 200-5 MCG/ACT IN AERO
2.0000 | INHALATION_SPRAY | Freq: Two times a day (BID) | RESPIRATORY_TRACT | Status: DC
  Administered 2019-01-29 – 2019-01-31 (×6): 2 via RESPIRATORY_TRACT
  Filled 2019-01-29: qty 8.8

## 2019-01-29 MED ORDER — ONDANSETRON HCL 4 MG PO TABS: 4.0000 mg | ORAL_TABLET | Freq: Four times a day (QID) | ORAL | Status: DC | PRN

## 2019-01-29 MED ORDER — VANCOMYCIN HCL IN DEXTROSE 750-5 MG/150ML-% IV SOLN
750.0000 mg | Freq: Two times a day (BID) | INTRAVENOUS | Status: DC
  Administered 2019-01-29: 750 mg via INTRAVENOUS
  Filled 2019-01-29 (×2): qty 150

## 2019-01-29 MED ORDER — IPRATROPIUM-ALBUTEROL 0.5-2.5 (3) MG/3ML IN SOLN
3.0000 mL | RESPIRATORY_TRACT | Status: DC | PRN
  Administered 2019-01-29: 3 mL via RESPIRATORY_TRACT
  Filled 2019-01-29: qty 3

## 2019-01-29 MED ORDER — DOXYCYCLINE HYCLATE 100 MG PO TABS
100.0000 mg | ORAL_TABLET | Freq: Two times a day (BID) | ORAL | Status: DC
  Administered 2019-01-29 – 2019-01-31 (×4): 100 mg via ORAL
  Filled 2019-01-29 (×4): qty 1

## 2019-01-29 MED ORDER — SERTRALINE HCL 50 MG PO TABS
100.0000 mg | ORAL_TABLET | Freq: Every day | ORAL | Status: DC
  Administered 2019-01-29 – 2019-01-31 (×3): 100 mg via ORAL
  Filled 2019-01-29 (×3): qty 2

## 2019-01-29 MED ORDER — ASPIRIN 81 MG PO CHEW
81.0000 mg | CHEWABLE_TABLET | Freq: Every day | ORAL | Status: DC
  Administered 2019-01-29: 81 mg via ORAL
  Filled 2019-01-29: qty 1

## 2019-01-29 MED ORDER — METHYLPREDNISOLONE SODIUM SUCC 125 MG IJ SOLR
60.0000 mg | Freq: Four times a day (QID) | INTRAMUSCULAR | Status: DC
  Administered 2019-01-29 – 2019-01-30 (×6): 60 mg via INTRAVENOUS
  Filled 2019-01-29 (×6): qty 2

## 2019-01-29 MED ORDER — ACETAMINOPHEN 650 MG RE SUPP: 650.0000 mg | Freq: Four times a day (QID) | RECTAL | Status: DC | PRN

## 2019-01-29 MED ORDER — ACETAMINOPHEN 325 MG PO TABS: 650.0000 mg | ORAL_TABLET | Freq: Four times a day (QID) | ORAL | Status: DC | PRN

## 2019-01-29 MED ORDER — DULOXETINE HCL 30 MG PO CPEP
30.0000 mg | ORAL_CAPSULE | Freq: Every day | ORAL | Status: DC
  Administered 2019-01-29 – 2019-01-31 (×3): 30 mg via ORAL
  Filled 2019-01-29 (×3): qty 1

## 2019-01-29 MED ORDER — SODIUM CHLORIDE 0.9 % IV SOLN
2.0000 g | Freq: Three times a day (TID) | INTRAVENOUS | Status: DC
  Administered 2019-01-29 – 2019-01-31 (×6): 2 g via INTRAVENOUS
  Filled 2019-01-29 (×9): qty 2

## 2019-01-29 MED ORDER — ARIPIPRAZOLE 5 MG PO TABS
5.0000 mg | ORAL_TABLET | Freq: Every day | ORAL | Status: DC
  Administered 2019-01-29 – 2019-01-31 (×3): 5 mg via ORAL
  Filled 2019-01-29 (×3): qty 1

## 2019-01-29 MED ORDER — ONDANSETRON HCL 4 MG/2ML IJ SOLN: 4.0000 mg | Freq: Four times a day (QID) | INTRAMUSCULAR | Status: DC | PRN

## 2019-01-29 NOTE — Plan of Care (Signed)
  Problem: Education: Goal: Knowledge of General Education information will improve Description: Including pain rating scale, medication(s)/side effects and non-pharmacologic comfort measures Outcome: Progressing   Problem: Health Behavior/Discharge Planning: Goal: Ability to manage health-related needs will improve Outcome: Progressing   Problem: Clinical Measurements: Goal: Ability to maintain clinical measurements within normal limits will improve Outcome: Progressing Goal: Will remain free from infection Outcome: Progressing Goal: Diagnostic test results will improve Outcome: Progressing Goal: Cardiovascular complication will be avoided Outcome: Progressing   Problem: Nutrition: Goal: Adequate nutrition will be maintained Outcome: Progressing   Problem: Elimination: Goal: Will not experience complications related to bowel motility Outcome: Progressing   Problem: Pain Managment: Goal: General experience of comfort will improve Outcome: Progressing   Problem: Safety: Goal: Ability to remain free from injury will improve Outcome: Progressing   Problem: Skin Integrity: Goal: Risk for impaired skin integrity will decrease Outcome: Progressing   

## 2019-01-29 NOTE — Progress Notes (Signed)
Pt ambulatory with 02 @ 2L via nasal canula. 02 sat 88-94% while ambulating. Pt ambulated around unit two times. Pt tolerated activity well.

## 2019-01-29 NOTE — Progress Notes (Signed)
Pt is alert and oriented. Chronic 2L of oxygen. Shortness of breath on exertion. Pt able to sleep in between care.

## 2019-01-29 NOTE — Progress Notes (Signed)
[place] at [place] NAME: Leslie Sims    MR#:  [id number]  DATE OF BIRTH:  13-Mar-1951  SUBJECTIVE:  CHIEF COMPLAINT: Patient saw shortness of breath is better still reporting cough  REVIEW OF SYSTEMS:  CONSTITUTIONAL: No fever, fatigue or weakness.  EYES: No blurred or double vision.  EARS, NOSE, AND THROAT: No tinnitus or ear pain.  RESPIRATORY: Reports cough, shortness of breath, wheezing, denies hemoptysis.  CARDIOVASCULAR: No chest pain, orthopnea, edema.  GASTROINTESTINAL: No nausea, vomiting, diarrhea or abdominal pain.  GENITOURINARY: No dysuria, hematuria.  ENDOCRINE: No polyuria, nocturia,  HEMATOLOGY: No anemia, easy bruising or bleeding SKIN: No rash or lesion. MUSCULOSKELETAL: No joint pain or arthritis.   NEUROLOGIC: No tingling, numbness, weakness.  PSYCHIATRY: No anxiety or depression.   DRUG ALLERGIES:   Allergies  Allergen Reactions  . Prozac [Fluoxetine Hcl] Shortness Of Breath  . Effexor Xr [Venlafaxine Hcl Er] Other (See Comments)    "Makes me feel funny"  . Wellbutrin [Bupropion] Other (See Comments)    "Makes me feel funny"    VITALS:  Blood pressure 119/68, pulse 89, temperature 98.2 F (36.8 C), temperature source Oral, resp. rate 17, height 5\' 8"  (1.727 m), weight 63.2 kg, SpO2 96 %.  PHYSICAL EXAMINATION:  GENERAL:  68 y.o.-year-old patient lying in the bed with no acute distress.  EYES: Pupils equal, round, reactive to light and accommodation. No scleral icterus. Extraocular muscles intact.  HEENT: Head atraumatic, normocephalic. Oropharynx and nasopharynx clear.  NECK:  Supple, no jugular venous distention. No thyroid enlargement, no tenderness.  LUNGS: Moderate breath sounds bilaterally, no wheezing, rales,rhonchi or crepitation. No use of accessory muscles of respiration.  CARDIOVASCULAR: S1, S2 normal. No murmurs, rubs, or gallops.  ABDOMEN: Soft, nontender, nondistended. Bowel sounds  present. EXTREMITIES: No pedal edema, cyanosis, or clubbing.  NEUROLOGIC: Awake, alert and oriented times sensation intact. Gait not checked.  PSYCHIATRIC: The patient is alert and oriented x 3.  SKIN: No obvious rash, lesion, or ulcer.    LABORATORY PANEL:   CBC Recent Labs  Lab 01/29/19 0311  WBC 8.0  HGB 10.3*  HCT 33.1*  PLT 285   ------------------------------------------------------------------------------------------------------------------  Chemistries  Recent Labs  Lab 01/28/19 2134 01/29/19 0311  NA 140 138  K 4.1 4.0  CL 95* 96*  CO2 38* 37*  GLUCOSE 87 276*  BUN 12 15  CREATININE 0.61 0.70  CALCIUM 9.4 8.9  AST 16  --   ALT 15  --   ALKPHOS 65  --   BILITOT 0.4  --    ------------------------------------------------------------------------------------------------------------------  Cardiac Enzymes Recent Labs  Lab 01/28/19 2134  TROPONINI <0.03   ------------------------------------------------------------------------------------------------------------------  RADIOLOGY:  Dg Chest Portable 1 View  Result Date: 01/28/2019 CLINICAL DATA:  Shortness of breath and chest discomfort EXAM: PORTABLE CHEST 1 VIEW COMPARISON:  12/31/2018 FINDINGS: Cardiac shadow is stable. The lungs are hyperinflated. Patchy lateral infiltrative density is noted in the right upper lobe new from the prior exam consistent with acute infiltrate. No sizable effusion is noted. Degenerative changes of the thoracic spine are noted. IMPRESSION: COPD. New right upper lobe infiltrate laterally. Followup PA and lateral chest X-ray is recommended in 3-4 weeks following trial of antibiotic therapy to ensure resolution and exclude underlying malignancy. Electronically Signed   By: Inez Catalina M.D.   On: 01/28/2019 22:10    EKG:   Orders placed or performed during the hospital encounter of 01/28/19  . EKG 12-Lead  .  EKG 12-Lead    ASSESSMENT AND PLAN:     HCAP  (healthcare-associated pneumonia) -IV antibiotics initiated, continue cefepime DC vancomycin and doxycycline added to the regimen  PRN duo nebs and antitussive    COPD with acute exacerbation (HCC) -IV Solu-Medrol, antibiotics as above, other supportive treatment as above, continue home meds    Essential hypertension -blood pressure is soft hold antihypertensives    CAD, NATIVE VESSEL -continue home medications aspirin and Lipitor    Chronic systolic heart failure (HCC) -c no exacerbation ontinue home meds.  Resume home medication Lasix    Hyperlipidemia-continue Lipitor   All the records are reviewed and case discussed with Care Management/Social Workerr. Management plans discussed with the patient, family and they are in agreement.  CODE STATUS: fc  TOTAL TIME TAKING CARE OF THIS PATIENT:35 minutes.   POSSIBLE D/C IN 1-2 DAYS, DEPENDING ON CLINICAL CONDITION.  Note: This dictation was prepared with Dragon dictation along with smaller phrase technology. Any transcriptional errors that result from this process are unintentional.   [name] M.D on 01/29/2019 at 4:30 PM  Between 7am to 6pm - Pager - [phone] After 6pm go to [email or web address] - password [email or web address] [facility]  Office  [phone]  CC: Primary care physician; [name], MD

## 2019-01-29 NOTE — ED Notes (Signed)
Flu swab sent to lab

## 2019-01-29 NOTE — Progress Notes (Signed)
Pharmacy Antibiotic Note  Leslie Sims is a 68 y.o. male admitted on 01/28/2019 with pneumonia.  Pharmacy has been consulted for vancomycin and cefepime dosing.  Plan: DW 63kg  Vd 41L kei 0.078 hr-1  T1/2 9 hours  Vancomycin 750 mg IV Q 12 hrs. Goal AUC 400-550. Expected AUC: 468 SCr used: 0.8  Cefepime 2 grams q 8 hours ordered.   Height: 5\' 8"  (172.7 cm) Weight: 139 lb 6.4 oz (63.2 kg) IBW/kg (Calculated) : 68.4  Temp (24hrs), Avg:98 F (36.7 C), Min:97.9 F (36.6 C), Max:98 F (36.7 C)  Recent Labs  Lab 01/28/19 2134  WBC 9.0  CREATININE 0.61    Estimated Creatinine Clearance: 82.3 mL/min (by C-G formula based on SCr of 0.61 mg/dL).    Allergies  Allergen Reactions  . Prozac [Fluoxetine Hcl] Shortness Of Breath  . Effexor Xr [Venlafaxine Hcl Er] Other (See Comments)    "Makes me feel funny"  . Wellbutrin [Bupropion] Other (See Comments)    "Makes me feel funny"    Antimicrobials this admission: Vancomycin, cefepime 2/10  >>    >>   Dose adjustments this admission:   Microbiology results: 2/9 BCx: pending 2/10 MRSA PCR: pending      2/9 CXR: RUL infiltrate Thank you for allowing pharmacy to be a part of this patient's care.  Penina Reisner S 01/29/2019 2:43 AM

## 2019-01-30 LAB — HEMOGLOBIN A1C
Hgb A1c MFr Bld: 6.2 % — ABNORMAL HIGH (ref 4.8–5.6)
Mean Plasma Glucose: 131.24 mg/dL

## 2019-01-30 MED ORDER — METHYLPREDNISOLONE SODIUM SUCC 40 MG IJ SOLR
40.0000 mg | Freq: Two times a day (BID) | INTRAMUSCULAR | Status: DC
  Administered 2019-01-30 – 2019-01-31 (×2): 40 mg via INTRAVENOUS
  Filled 2019-01-30 (×2): qty 1

## 2019-01-30 NOTE — Progress Notes (Signed)
[place] at [place] NAME: Leslie Sims    MR#:  [id number]  DATE OF BIRTH:  October 11, 1951  SUBJECTIVE:  CHIEF COMPLAINT: Patient is weak and still shortness of breath some cough and runny nose  REVIEW OF SYSTEMS:  CONSTITUTIONAL: No fever, fatigue or weakness.  EYES: No blurred or double vision.  EARS, NOSE, AND THROAT: No tinnitus or ear pain.  RESPIRATORY: Reports cough, shortness of breath, wheezing, denies hemoptysis.  CARDIOVASCULAR: No chest pain, orthopnea, edema.  GASTROINTESTINAL: No nausea, vomiting, diarrhea or abdominal pain.  GENITOURINARY: No dysuria, hematuria.  ENDOCRINE: No polyuria, nocturia,  HEMATOLOGY: No anemia, easy bruising or bleeding SKIN: No rash or lesion. MUSCULOSKELETAL: No joint pain or arthritis.   NEUROLOGIC: No tingling, numbness, weakness.  PSYCHIATRY: No anxiety or depression.   DRUG ALLERGIES:   Allergies  Allergen Reactions  . Prozac [Fluoxetine Hcl] Shortness Of Breath  . Effexor Xr [Venlafaxine Hcl Er] Other (See Comments)    "Makes me feel funny"  . Wellbutrin [Bupropion] Other (See Comments)    "Makes me feel funny"    VITALS:  Blood pressure 118/77, pulse 83, temperature 97.7 F (36.5 C), temperature source Oral, resp. rate 20, height 5\' 8"  (1.727 m), weight 63.2 kg, SpO2 98 %.  PHYSICAL EXAMINATION:  GENERAL:  68 y.o.-year-old patient lying in the bed with no acute distress.  EYES: Pupils equal, round, reactive to light and accommodation. No scleral icterus. Extraocular muscles intact.  HEENT: Head atraumatic, normocephalic. Oropharynx and nasopharynx clear.  NECK:  Supple, no jugular venous distention. No thyroid enlargement, no tenderness.  LUNGS: Moderate breath sounds bilaterally, no wheezing, rales,rhonchi or crepitation. No use of accessory muscles of respiration.  CARDIOVASCULAR: S1, S2 normal. No murmurs, rubs, or gallops.  ABDOMEN: Soft, nontender, nondistended.  Bowel sounds present. EXTREMITIES: No pedal edema, cyanosis, or clubbing.  NEUROLOGIC: Awake, alert and oriented times sensation intact. Gait not checked.  PSYCHIATRIC: The patient is alert and oriented x 3.  SKIN: No obvious rash, lesion, or ulcer.    LABORATORY PANEL:   CBC Recent Labs  Lab 01/29/19 0311  WBC 8.0  HGB 10.3*  HCT 33.1*  PLT 285   ------------------------------------------------------------------------------------------------------------------  Chemistries  Recent Labs  Lab 01/28/19 2134 01/29/19 0311  NA 140 138  K 4.1 4.0  CL 95* 96*  CO2 38* 37*  GLUCOSE 87 276*  BUN 12 15  CREATININE 0.61 0.70  CALCIUM 9.4 8.9  AST 16  --   ALT 15  --   ALKPHOS 65  --   BILITOT 0.4  --    ------------------------------------------------------------------------------------------------------------------  Cardiac Enzymes Recent Labs  Lab 01/28/19 2134  TROPONINI <0.03   ------------------------------------------------------------------------------------------------------------------  RADIOLOGY:  Dg Chest Portable 1 View  Result Date: 01/28/2019 CLINICAL DATA:  Shortness of breath and chest discomfort EXAM: PORTABLE CHEST 1 VIEW COMPARISON:  12/31/2018 FINDINGS: Cardiac shadow is stable. The lungs are hyperinflated. Patchy lateral infiltrative density is noted in the right upper lobe new from the prior exam consistent with acute infiltrate. No sizable effusion is noted. Degenerative changes of the thoracic spine are noted. IMPRESSION: COPD. New right upper lobe infiltrate laterally. Followup PA and lateral chest X-ray is recommended in 3-4 weeks following trial of antibiotic therapy to ensure resolution and exclude underlying malignancy. Electronically Signed   By: Inez Catalina M.D.   On: 01/28/2019 22:10    EKG:   Orders placed or performed during the hospital encounter of 01/28/19  . EKG  12-Lead  . EKG 12-Lead    ASSESSMENT AND PLAN:     HCAP  (healthcare-associated pneumonia) - Clinically improving at a slower pace IV antibiotics  Cefepime  and doxycycline to be continued  PRN duo nebs and antitussive    COPD with acute exacerbation (HCC) -IV Solu-Medrol is diapered antibiotics as above, other supportive treatment as above, continue home meds    Essential hypertension -blood pressure is soft hold antihypertensives    CAD, NATIVE VESSEL -continue home medications aspirin and Lipitor    Chronic systolic heart failure ([place]) -c no exacerbation ontinue home meds.  Resume home medication Lasix    Hyperlipidemia-continue Lipitor   All the records are reviewed and case discussed with Care Management/Social Workerr. Management plans discussed with the patient, family and they are in agreement.  CODE STATUS: fc  TOTAL TIME TAKING CARE OF THIS PATIENT:35 minutes.   POSSIBLE D/C IN 1-2 DAYS, DEPENDING ON CLINICAL CONDITION.  Note: This dictation was prepared with Dragon dictation along with smaller phrase technology. Any transcriptional errors that result from this process are unintentional.   [name] M.D on 01/30/2019 at 2:47 PM  Between 7am to 6pm - Pager - [phone] After 6pm go to [email or web address] - password [email or web address] [facility]  Office  [phone]  CC: Primary care physician; [name], MD

## 2019-01-30 NOTE — Progress Notes (Signed)
SATURATION QUALIFICATIONS: (This note is used to comply with regulatory documentation for home oxygen)  Patient Saturations on Room Air at Rest =   Patient Saturations on Room Air while Ambulating =   Patient Saturations on 2 Liters of oxygen while Ambulating = 90%  Please briefly explain why patient needs home oxygen: 

## 2019-01-31 MED ORDER — PREDNISONE 10 MG (21) PO TBPK: 10.0000 mg | ORAL_TABLET | Freq: Every day | ORAL | 0 refills | Status: DC

## 2019-01-31 MED ORDER — GUAIFENESIN-DM 100-10 MG/5ML PO SYRP: 5.0000 mL | ORAL_SOLUTION | ORAL | 0 refills | Status: DC | PRN

## 2019-01-31 MED ORDER — CEFDINIR 300 MG PO CAPS: 300.0000 mg | ORAL_CAPSULE | Freq: Two times a day (BID) | ORAL | 0 refills | Status: DC

## 2019-01-31 MED ORDER — DOXYCYCLINE HYCLATE 100 MG PO TABS: 100.0000 mg | ORAL_TABLET | Freq: Two times a day (BID) | ORAL | 0 refills | Status: DC

## 2019-01-31 MED ORDER — CEFDINIR 300 MG PO CAPS
300.0000 mg | ORAL_CAPSULE | Freq: Two times a day (BID) | ORAL | Status: DC
  Filled 2019-01-31 (×2): qty 1

## 2019-01-31 NOTE — Care Management Note (Signed)
Case Management Note  Patient Details  Name: Leslie Sims MRN: [id number] Date of Birth: 01/07/1951  Subjective/Objective:                  Met with patient to discuss DC plan and needs The patient lives alone He is independent and still drive Ambulates without assistance Patient has home Oxygen thru [employer] Chronically on 2 liters and is currently on 2 liters no change Patient states that he has no needs at home His PCP is Christy Smith Pharmacy is [facility]   Action/Plan:  Patient has no needs for CM  Expected Discharge Date:  01/31/19               Expected Discharge Plan:     In-House Referral:     Discharge planning Services  CM Consult  Post Acute Care Choice:    Choice offered to:     DME Arranged:    DME Agency:     HH Arranged:    HH Agency:     Status of Service:  Completed, signed off  If discussed at Long Length of Stay Meetings, dates discussed:    Additional Comments:   J , RN 01/31/2019, 10:36 AM  

## 2019-01-31 NOTE — Progress Notes (Signed)
Discharge instructions given. Prescriptions given. Pt verbalizes understanding.

## 2019-01-31 NOTE — Discharge Summary (Signed)
[facility] NAME: Leslie Sims    MR#:  [id number]  DATE OF BIRTH:  Jan 16, 1951  DATE OF ADMISSION:  01/28/2019 ADMITTING PHYSICIAN: Lance Coon, MD  DATE OF DISCHARGE:  01/31/19   PRIMARY CARE PHYSICIAN: [name], MD    ADMISSION DIAGNOSIS:  COPD exacerbation ([place]) [J44.1] Community acquired pneumonia of right upper lobe of lung ([place]) [J18.1]  DISCHARGE DIAGNOSIS:  Principal Problem:   HCAP (healthcare-associated pneumonia) Active Problems:   Hyperlipidemia   Essential hypertension   CAD, NATIVE VESSEL   Chronic systolic heart failure ([place])   GERD   COPD with acute exacerbation ([place])   SECONDARY DIAGNOSIS:   Past Medical History:  Diagnosis Date  . Allergic rhinitis   . Anxiety   . Back pain   . Colon polyps 08/2012   colonoscopy; multiple colon polyps; repeat colonoscopy in one year.  [name] COPD (chronic obstructive pulmonary disease) ([place])   . Coronary artery disease 11/2009   a. late presenting ant MI; b. LHC 100% pLAD s/p PCI/DES, 99% mRCA s/p PCI/DES, EF 35%; c. nuclear stress test 05/13: prior ant/inf infarcts w/o ischemia, EF 43%; d. LHC 02/14: widely patent stents with no other obs dz, EF 40%; e. LHC 11/17: LM nl, mLAD 10%, patent LAD stent, dLAD 20%, p-mRCA 10%, mRCA 40%, patent RCA stent, EF 35-45%   . Depression   . GERD (gastroesophageal reflux disease)   . Headache(784.0)   . Helicobacter pylori (H. pylori)   . Hemorrhoids   . Hernia    inguinal  . Hyperlipidemia   . Hypertension   . Insomnia   . Ischemic cardiomyopathy   . Myalgia   . Peptic ulcer   . Pulmonary nodules   . ST elevation (STEMI) myocardial infarction involving left anterior descending coronary artery ([place]) 11/2009   a. s/p PCI to the LAD  . Status post dilation of esophageal narrowing 2000  . Tinea pedis   . Wears dentures    partial upper    HOSPITAL COURSE:  HPI  Leslie Sims  is a 68 y.o. male who presents  with chief complaint as above.  He states that he has been having some progressive shortness of breath, with cough and some sputum production for the past several days.  His symptoms got acutely worse today with significant wheezing.  He came to the ED for evaluation.  He was recently admitted here in the hospital for COPD exacerbation.  In the ED today he was found to have right upper lobe pneumonia.  Hospitalist were called for admission   HCAP (healthcare-associated pneumonia) - Clinically much better IV antibiotics  Cefepime  and doxycycline given during the hospital course and will discharge patient with Omnicef  PRN duo nebs and antitussive  COPD with acute exacerbation ([place]) -IV Solu-Medrol is tapered to p.o. prednisone as patient is clinically feeling much better  antibiotics as above, other supportive treatment as above, continue home meds Continue 2 L of oxygen via nasal cannula  Essential hypertension -blood pressure is soft hold antihypertensive Cozaar at this time  CAD, NATIVE VESSEL -continue home medications aspirin and Lipitor  Chronic systolic heart failure ([facility]) -c no exacerbation ontinue home meds.  Resume home medication Lasix  Hyperlipidemia-continue Lipitor  Discharge patient home DISCHARGE CONDITIONS:   STABLE   CONSULTS OBTAINED:     PROCEDURES none  DRUG ALLERGIES:   Allergies  Allergen Reactions  . Prozac [Fluoxetine Hcl] Shortness Of Breath  .  Effexor Xr [Venlafaxine Hcl Er] Other (See Comments)    "Makes me feel funny"  . Wellbutrin [Bupropion] Other (See Comments)    "Makes me feel funny"    DISCHARGE MEDICATIONS:   Allergies as of 01/31/2019      Reactions   Prozac [fluoxetine Hcl] Shortness Of Breath   Effexor Xr [venlafaxine Hcl Er] Other (See Comments)   "Makes me feel funny"   Wellbutrin [bupropion] Other (See Comments)   "Makes me feel funny"      Medication List    STOP taking these medications   azithromycin  250 MG tablet Commonly known as:  ZITHROMAX   losartan 25 MG tablet Commonly known as:  COZAAR     TAKE these medications   acetaminophen 325 MG tablet Commonly known as:  TYLENOL Take 2 tablets (650 mg total) by mouth every 6 (six) hours as needed for mild pain (or Fever >/= 101).   albuterol 108 (90 Base) MCG/ACT inhaler Commonly known as:  PROVENTIL HFA;VENTOLIN HFA Inhale 2 puffs into the lungs every 6 (six) hours as needed for wheezing or shortness of breath.   ARIPiprazole 5 MG tablet Commonly known as:  ABILIFY Take 5 mg by mouth daily.   aspirin 81 MG tablet Take 81 mg by mouth daily.   atorvastatin 40 MG tablet Commonly known as:  LIPITOR Take 1 tablet (40 mg total) by mouth daily.   budesonide 0.25 MG/2ML nebulizer solution Commonly known as:  PULMICORT Inhale 0.25 mg into the lungs daily.   cefdinir 300 MG capsule Commonly known as:  OMNICEF Take 1 capsule (300 mg total) by mouth every 12 (twelve) hours.   doxycycline 100 MG tablet Commonly known as:  VIBRA-TABS Take 1 tablet (100 mg total) by mouth every 12 (twelve) hours.   feeding supplement (ENSURE ENLIVE) Liqd Take 237 mLs by mouth 3 (three) times daily between meals.   Fluticasone-Salmeterol 500-50 MCG/DOSE Aepb Commonly known as:  ADVAIR Inhale 1 puff into the lungs every 12 (twelve) hours.   furosemide 20 MG tablet Commonly known as:  LASIX Take 1 tablet (20 mg total) by mouth daily.   guaiFENesin-dextromethorphan 100-10 MG/5ML syrup Commonly known as:  ROBITUSSIN DM Take 5 mLs by mouth every 4 (four) hours as needed for cough.   hydrOXYzine 25 MG capsule Commonly known as:  VISTARIL Take 25 mg by mouth 2 (two) times daily as needed for anxiety or itching.   ipratropium-albuterol 0.5-2.5 (3) MG/3ML Soln Commonly known as:  DUONEB Take 3 mLs by nebulization 4 (four) times daily. DX:J44.9   LORazepam 1 MG tablet Commonly known as:  ATIVAN Take 1 tablet (1 mg total) by mouth every 8  (eight) hours as needed for anxiety.   mirtazapine 30 MG tablet Commonly known as:  REMERON Take 1 tablet (30 mg total) by mouth at bedtime.   multivitamin capsule Take 1 capsule by mouth daily.   nitroGLYCERIN 0.4 MG SL tablet Commonly known as:  NITROSTAT Place 1 tablet (0.4 mg total) under the tongue every 5 (five) minutes as needed.   predniSONE 10 MG (21) Tbpk tablet Commonly known as:  STERAPRED UNI-PAK 21 TAB Take 1 tablet (10 mg total) by mouth daily. Take 6 tablets by mouth for 1 day followed by  5 tablets by mouth for 1 day followed by  4 tablets by mouth for 1 day followed by  3 tablets by mouth for 1 day followed by  2 tablets by mouth for 1 day followed by  1 tablet by mouth for a day and stop   sertraline 100 MG tablet Commonly known as:  ZOLOFT Take 200 mg by mouth daily.   sucralfate 1 GM/10ML suspension Commonly known as:  CARAFATE Take 10 mLs (1 g total) by mouth 4 (four) times daily -  with meals and at bedtime. What changed:    when to take this  reasons to take this        DISCHARGE INSTRUCTIONS:   Follow up with primary care physician in 3 to 4 days  DIET:  Cardiac diet  DISCHARGE CONDITION:  Stable  ACTIVITY:  Activity as tolerated  OXYGEN:  Home Oxygen: Yes.     Oxygen Delivery: 2 liters/min via Patient connected to nasal cannula oxygen  DISCHARGE LOCATION:  home   If you experience worsening of your admission symptoms, develop shortness of breath, life threatening emergency, suicidal or homicidal thoughts you must seek medical attention immediately by calling 911 or calling your MD immediately  if symptoms less severe.  You Must read complete instructions/literature along with all the possible adverse reactions/side effects for all the Medicines you take and that have been prescribed to you. Take any new Medicines after you have completely understood and accpet all the possible adverse reactions/side effects.   Please  note  You were cared for by a hospitalist during your hospital stay. If you have any questions about your discharge medications or the care you received while you were in the hospital after you are discharged, you can call the unit and asked to speak with the hospitalist on call if the hospitalist that took care of you is not available. Once you are discharged, your primary care physician will handle any further medical issues. Please note that NO REFILLS for any discharge medications will be authorized once you are discharged, as it is imperative that you return to your primary care physician (or establish a relationship with a primary care physician if you do not have one) for your aftercare needs so that they can reassess your need for medications and monitor your lab values.     Today  Chief Complaint  Patient presents with  . Shortness of Breath   Patient is feeling much better shortness of breath significantly improved cough is sporadic and wants to go home  ROS:  CONSTITUTIONAL: Denies fevers, chills. Denies any fatigue, weakness.  EYES: Denies blurry vision, double vision, eye pain. EARS, NOSE, THROAT: Denies tinnitus, ear pain, hearing loss. RESPIRATORY: Significantly improved cough, wheeze, shortness of breath.  CARDIOVASCULAR: Denies chest pain, palpitations, edema.  GASTROINTESTINAL: Denies nausea, vomiting, diarrhea, abdominal pain. Denies bright red blood per rectum. GENITOURINARY: Denies dysuria, hematuria. ENDOCRINE: Denies nocturia or thyroid problems. HEMATOLOGIC AND LYMPHATIC: Denies easy bruising or bleeding. SKIN: Denies rash or lesion. MUSCULOSKELETAL: Denies pain in neck, back, shoulder, knees, hips or arthritic symptoms.  NEUROLOGIC: Denies paralysis, paresthesias.  PSYCHIATRIC: Denies anxiety or depressive symptoms.   VITAL SIGNS:  Blood pressure 137/73, pulse 77, temperature 98.8 F (37.1 C), temperature source Oral, resp. rate 18, height 5\' 8"  (1.727 m),  weight 63.2 kg, SpO2 100 %.  I/O:    Intake/Output Summary (Last 24 hours) at 01/31/2019 1046 Last data filed at 01/31/2019 0900 Gross per 24 hour  Intake 240 ml  Output -  Net 240 ml    PHYSICAL EXAMINATION:  GENERAL:  68 y.o.-year-old patient lying in the bed with no acute distress.  EYES: Pupils equal, round, reactive to light and accommodation. No scleral icterus.  Extraocular muscles intact.  HEENT: Head atraumatic, normocephalic. Oropharynx and nasopharynx clear.  NECK:  Supple, no jugular venous distention. No thyroid enlargement, no tenderness.  LUNGS: Normal breath sounds bilaterally, no wheezing, rales,rhonchi or crepitation. No use of accessory muscles of respiration.  CARDIOVASCULAR: S1, S2 normal. No murmurs, rubs, or gallops.  ABDOMEN: Soft, non-tender, non-distended. Bowel sounds present.  EXTREMITIES: No pedal edema, cyanosis, or clubbing.  NEUROLOGIC: Cranial nerves are grossly intact and patient is awake alert and oriented x3 sensation intact. Gait not checked.  PSYCHIATRIC: The patient is alert and oriented x 3.  SKIN: No obvious rash, lesion, or ulcer.   DATA REVIEW:   CBC Recent Labs  Lab 01/29/19 0311  WBC 8.0  HGB 10.3*  HCT 33.1*  PLT 285    Chemistries  Recent Labs  Lab 01/28/19 2134 01/29/19 0311  NA 140 138  K 4.1 4.0  CL 95* 96*  CO2 38* 37*  GLUCOSE 87 276*  BUN 12 15  CREATININE 0.61 0.70  CALCIUM 9.4 8.9  AST 16  --   ALT 15  --   ALKPHOS 65  --   BILITOT 0.4  --     Cardiac Enzymes Recent Labs  Lab 01/28/19 2134  TROPONINI <0.03    Microbiology Results  Results for orders placed or performed during the hospital encounter of 01/28/19  Blood culture (routine x 2)     Status: None (Preliminary result)   Collection Time: 01/28/19 11:19 PM  Result Value Ref Range Status   Specimen Description BLOOD LEFT FOREARM  Final   Special Requests   Final    BOTTLES DRAWN AEROBIC AND ANAEROBIC Blood Culture adequate volume   Culture    Final    NO GROWTH 3 DAYS Performed at [facility], [address], [place], [place] [address]    Report Status PENDING  Incomplete  Blood culture (routine x 2)     Status: None (Preliminary result)   Collection Time: 01/28/19 11:19 PM  Result Value Ref Range Status   Specimen Description BLOOD RIGHT WRIST  Final   Special Requests   Final    BOTTLES DRAWN AEROBIC AND ANAEROBIC Blood Culture adequate volume   Culture   Final    NO GROWTH 3 DAYS Performed at [facility] [address], [place], [place] [address]    Report Status PENDING  Incomplete  MRSA PCR Screening     Status: None   Collection Time: 01/29/19  3:06 AM  Result Value Ref Range Status   MRSA by PCR NEGATIVE NEGATIVE Final    Comment:        The GeneXpert MRSA Assay (FDA approved for NASAL specimens only), is one component of a comprehensive MRSA colonization surveillance program. It is not intended to diagnose MRSA infection nor to guide or monitor treatment for MRSA infections. Performed at [facility], [place] [address] [place], [place] [address]     RADIOLOGY:  Dg Chest Portable 1 View  Result Date: 01/28/2019 CLINICAL DATA:  Shortness of breath and chest discomfort EXAM: PORTABLE CHEST 1 VIEW COMPARISON:  12/31/2018 FINDINGS: Cardiac shadow is stable. The lungs are hyperinflated. Patchy lateral infiltrative density is noted in the right upper lobe new from the prior exam consistent with acute infiltrate. No sizable effusion is noted. Degenerative changes of the thoracic spine are noted. IMPRESSION: COPD. New right upper lobe infiltrate laterally. Followup PA and lateral chest X-ray is recommended in 3-4 weeks following trial of antibiotic therapy  to ensure resolution and exclude underlying malignancy. Electronically Signed   By: Inez Catalina M.D.   On: 01/28/2019 22:10    EKG:   Orders placed or performed during the hospital encounter of 01/28/19  . EKG 12-Lead  . EKG  12-Lead      Management plans discussed with the patient, family and they are in agreement.  CODE STATUS:     Code Status Orders  (From admission, onward)         Start     Ordered   01/29/19 0013  Full code  Continuous     01/29/19 0012        Code Status History    Date Active Date Inactive Code Status Order ID Comments User Context   12/31/2018 1909 01/02/2019 1808 Full Code [id number]  [name], MD ED   10/01/2018 1243 10/01/2018 1759 Full Code [id number]  Lucious Groves, RN Inpatient   09/13/2018 1949 09/15/2018 1719 Full Code [id number]  Loletha Grayer, MD ED   08/25/2018 0140 08/26/2018 1737 Full Code [id number]  Harrie Foreman, MD Inpatient   07/02/2018 1903 07/03/2018 2216 Full Code [id number]  Hillary Bow, MD ED   12/26/2017 0129 12/30/2017 2106 Full Code [id number]  Amelia Jo, MD Inpatient      TOTAL TIME TAKING CARE OF THIS PATIENT: 43  minutes.   Note: This dictation was prepared with Dragon dictation along with smaller phrase technology. Any transcriptional errors that result from this process are unintentional.   @[facility] @  on 01/31/2019 at 10:46 AM  Between 7am to 6pm - Pager - [phone]  After 6pm go to [email or web address] - password [email or web address] [place] [facility]  Office  [phone]  CC: Primary care physician; [name], MD

## 2019-01-31 NOTE — Discharge Instructions (Signed)
Follow-up with primary care physician in 3 to 4 days ° °

## 2019-02-02 LAB — CULTURE, BLOOD (ROUTINE X 2)
CULTURE: NO GROWTH
Culture: NO GROWTH
Special Requests: ADEQUATE
Special Requests: ADEQUATE

## 2019-02-10 ENCOUNTER — Inpatient Hospital Stay
Admission: EM | Admit: 2019-02-10 | Discharge: 2019-02-14 | DRG: 193 | Disposition: A | Discharge status: Home Health | Payer: Medicare Other | Attending: Internal Medicine | Admitting: Internal Medicine

## 2019-02-10 ENCOUNTER — Emergency Department: Payer: Medicare Other

## 2019-02-10 ENCOUNTER — Encounter: Payer: Self-pay | Admitting: Emergency Medicine

## 2019-02-10 DIAGNOSIS — I252 Old myocardial infarction: Secondary | ICD-10-CM

## 2019-02-10 DIAGNOSIS — Z833 Family history of diabetes mellitus: Secondary | ICD-10-CM

## 2019-02-10 DIAGNOSIS — Z7982 Long term (current) use of aspirin: Secondary | ICD-10-CM

## 2019-02-10 DIAGNOSIS — I10 Essential (primary) hypertension: Secondary | ICD-10-CM | POA: Diagnosis present

## 2019-02-10 DIAGNOSIS — Z801 Family history of malignant neoplasm of trachea, bronchus and lung: Secondary | ICD-10-CM

## 2019-02-10 DIAGNOSIS — J189 Pneumonia, unspecified organism: Principal | ICD-10-CM

## 2019-02-10 DIAGNOSIS — F329 Major depressive disorder, single episode, unspecified: Secondary | ICD-10-CM | POA: Diagnosis present

## 2019-02-10 DIAGNOSIS — F419 Anxiety disorder, unspecified: Secondary | ICD-10-CM | POA: Diagnosis present

## 2019-02-10 DIAGNOSIS — Z7951 Long term (current) use of inhaled steroids: Secondary | ICD-10-CM

## 2019-02-10 DIAGNOSIS — I2510 Atherosclerotic heart disease of native coronary artery without angina pectoris: Secondary | ICD-10-CM | POA: Diagnosis present

## 2019-02-10 DIAGNOSIS — Z8701 Personal history of pneumonia (recurrent): Secondary | ICD-10-CM

## 2019-02-10 DIAGNOSIS — Z9981 Dependence on supplemental oxygen: Secondary | ICD-10-CM

## 2019-02-10 DIAGNOSIS — J440 Chronic obstructive pulmonary disease with acute lower respiratory infection: Secondary | ICD-10-CM | POA: Diagnosis present

## 2019-02-10 DIAGNOSIS — Y95 Nosocomial condition: Secondary | ICD-10-CM | POA: Diagnosis present

## 2019-02-10 DIAGNOSIS — J9621 Acute and chronic respiratory failure with hypoxia: Secondary | ICD-10-CM | POA: Diagnosis present

## 2019-02-10 DIAGNOSIS — R911 Solitary pulmonary nodule: Secondary | ICD-10-CM | POA: Diagnosis present

## 2019-02-10 DIAGNOSIS — E785 Hyperlipidemia, unspecified: Secondary | ICD-10-CM | POA: Diagnosis present

## 2019-02-10 DIAGNOSIS — Z8249 Family history of ischemic heart disease and other diseases of the circulatory system: Secondary | ICD-10-CM

## 2019-02-10 DIAGNOSIS — Z825 Family history of asthma and other chronic lower respiratory diseases: Secondary | ICD-10-CM

## 2019-02-10 DIAGNOSIS — F17210 Nicotine dependence, cigarettes, uncomplicated: Secondary | ICD-10-CM | POA: Diagnosis present

## 2019-02-10 DIAGNOSIS — Z955 Presence of coronary angioplasty implant and graft: Secondary | ICD-10-CM

## 2019-02-10 DIAGNOSIS — J441 Chronic obstructive pulmonary disease with (acute) exacerbation: Secondary | ICD-10-CM | POA: Diagnosis present

## 2019-02-10 DIAGNOSIS — Z7189 Other specified counseling: Secondary | ICD-10-CM

## 2019-02-10 DIAGNOSIS — Z515 Encounter for palliative care: Secondary | ICD-10-CM

## 2019-02-10 LAB — BASIC METABOLIC PANEL
Anion gap: 8 (ref 5–15)
BUN: 16 mg/dL (ref 8–23)
CO2: 33 mmol/L — ABNORMAL HIGH (ref 22–32)
Calcium: 8.9 mg/dL (ref 8.9–10.3)
Chloride: 95 mmol/L — ABNORMAL LOW (ref 98–111)
Creatinine, Ser: 0.6 mg/dL — ABNORMAL LOW (ref 0.61–1.24)
GFR calc Af Amer: >60 mL/min (ref >60)
GFR calc non Af Amer: >60 mL/min (ref >60)
Glucose, Bld: 125 mg/dL — ABNORMAL HIGH (ref 70–99)
Potassium: 3.8 mmol/L (ref 3.5–5.1)
Sodium: 136 mmol/L (ref 135–145)

## 2019-02-10 LAB — CBC
HCT: 38.1 % — ABNORMAL LOW (ref 39.0–52.0)
Hemoglobin: 11.9 g/dL — ABNORMAL LOW (ref 13.0–17.0)
MCH: 29.2 pg (ref 26.0–34.0)
MCHC: 31.2 g/dL (ref 30.0–36.0)
MCV: 93.6 fL (ref 80.0–100.0)
Platelets: 253 10*3/uL (ref 150–400)
RBC: 4.07 MIL/uL — AB (ref 4.22–5.81)
RDW: 12.6 % (ref 11.5–15.5)
WBC: 10.9 10*3/uL — AB (ref 4.0–10.5)
nRBC: 0 % (ref 0.0–0.2)

## 2019-02-10 LAB — TROPONIN I: Troponin I: <0.03 ng/mL (ref <0.03)

## 2019-02-10 IMAGING — DX DG CHEST 1V PORT
2 series · 2 of 2 positions shown · non-contrast
Comparison: [DATE]

CLINICAL DATA: Shortness of Breath

EXAM:
PORTABLE CHEST 1 VIEW

[chest ap (1 of 2)]
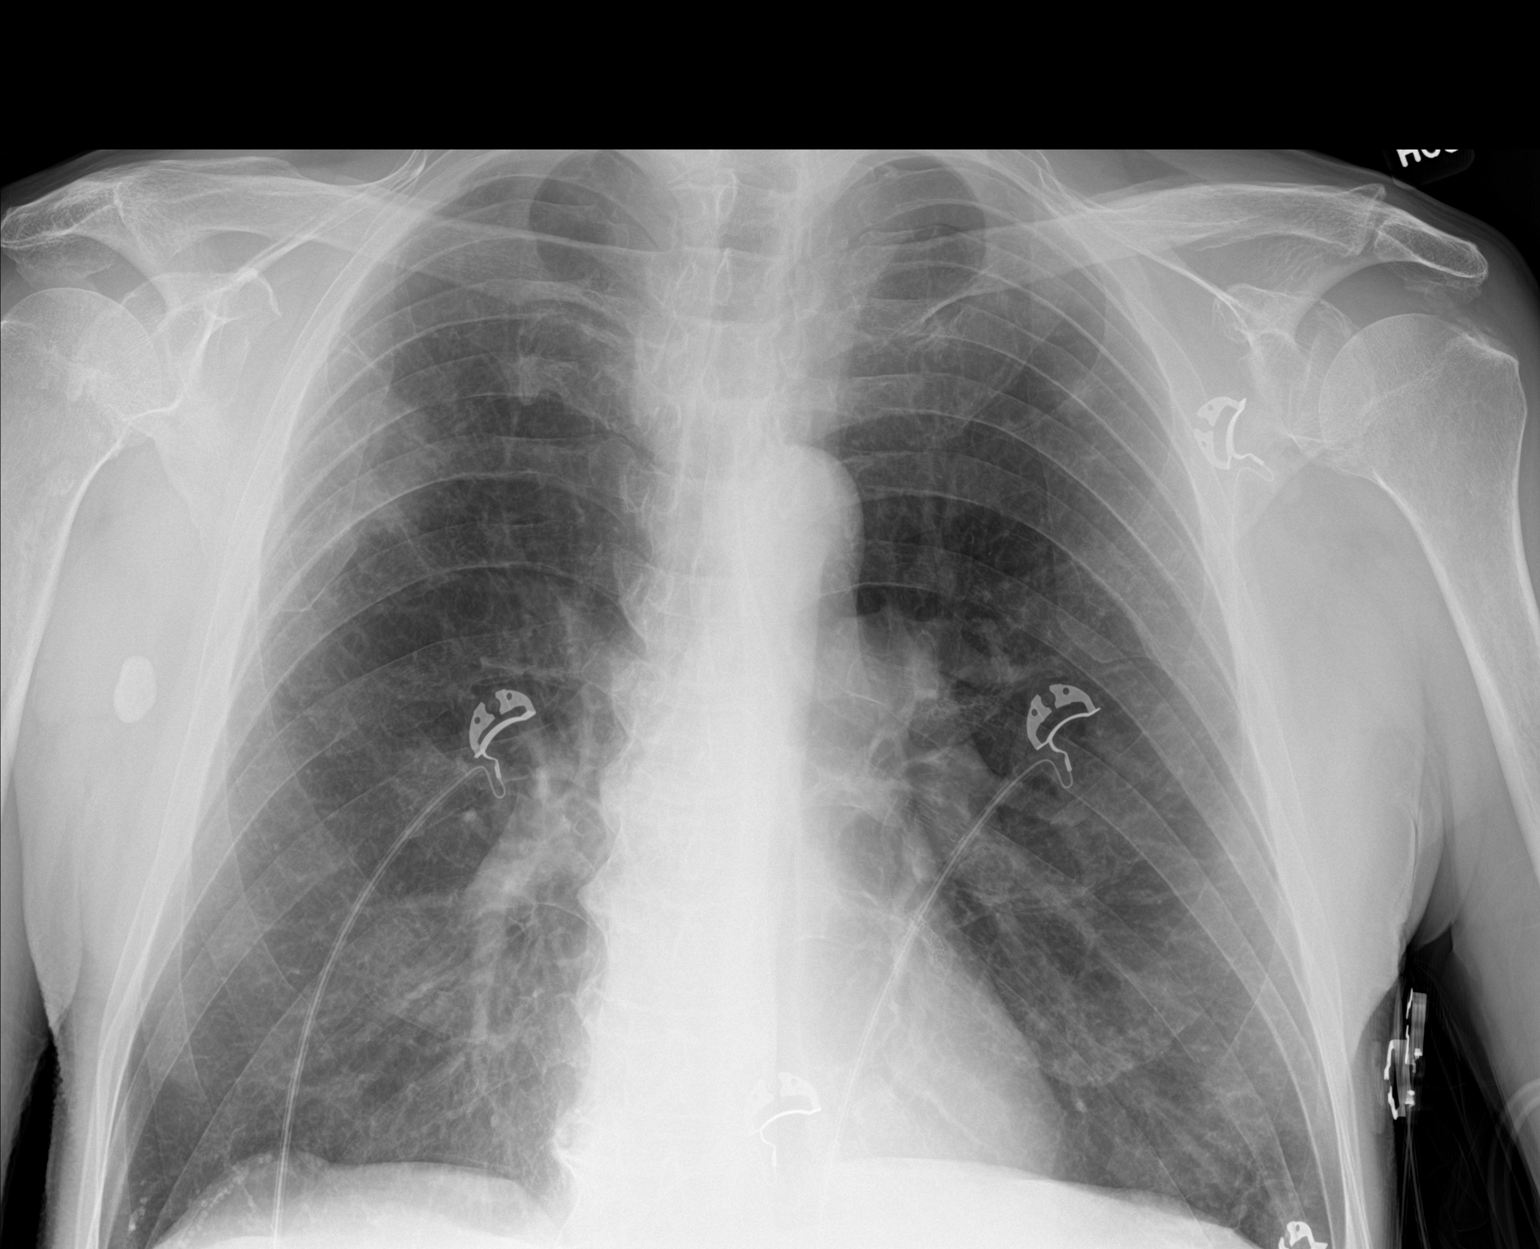

[chest ap (2 of 2)]
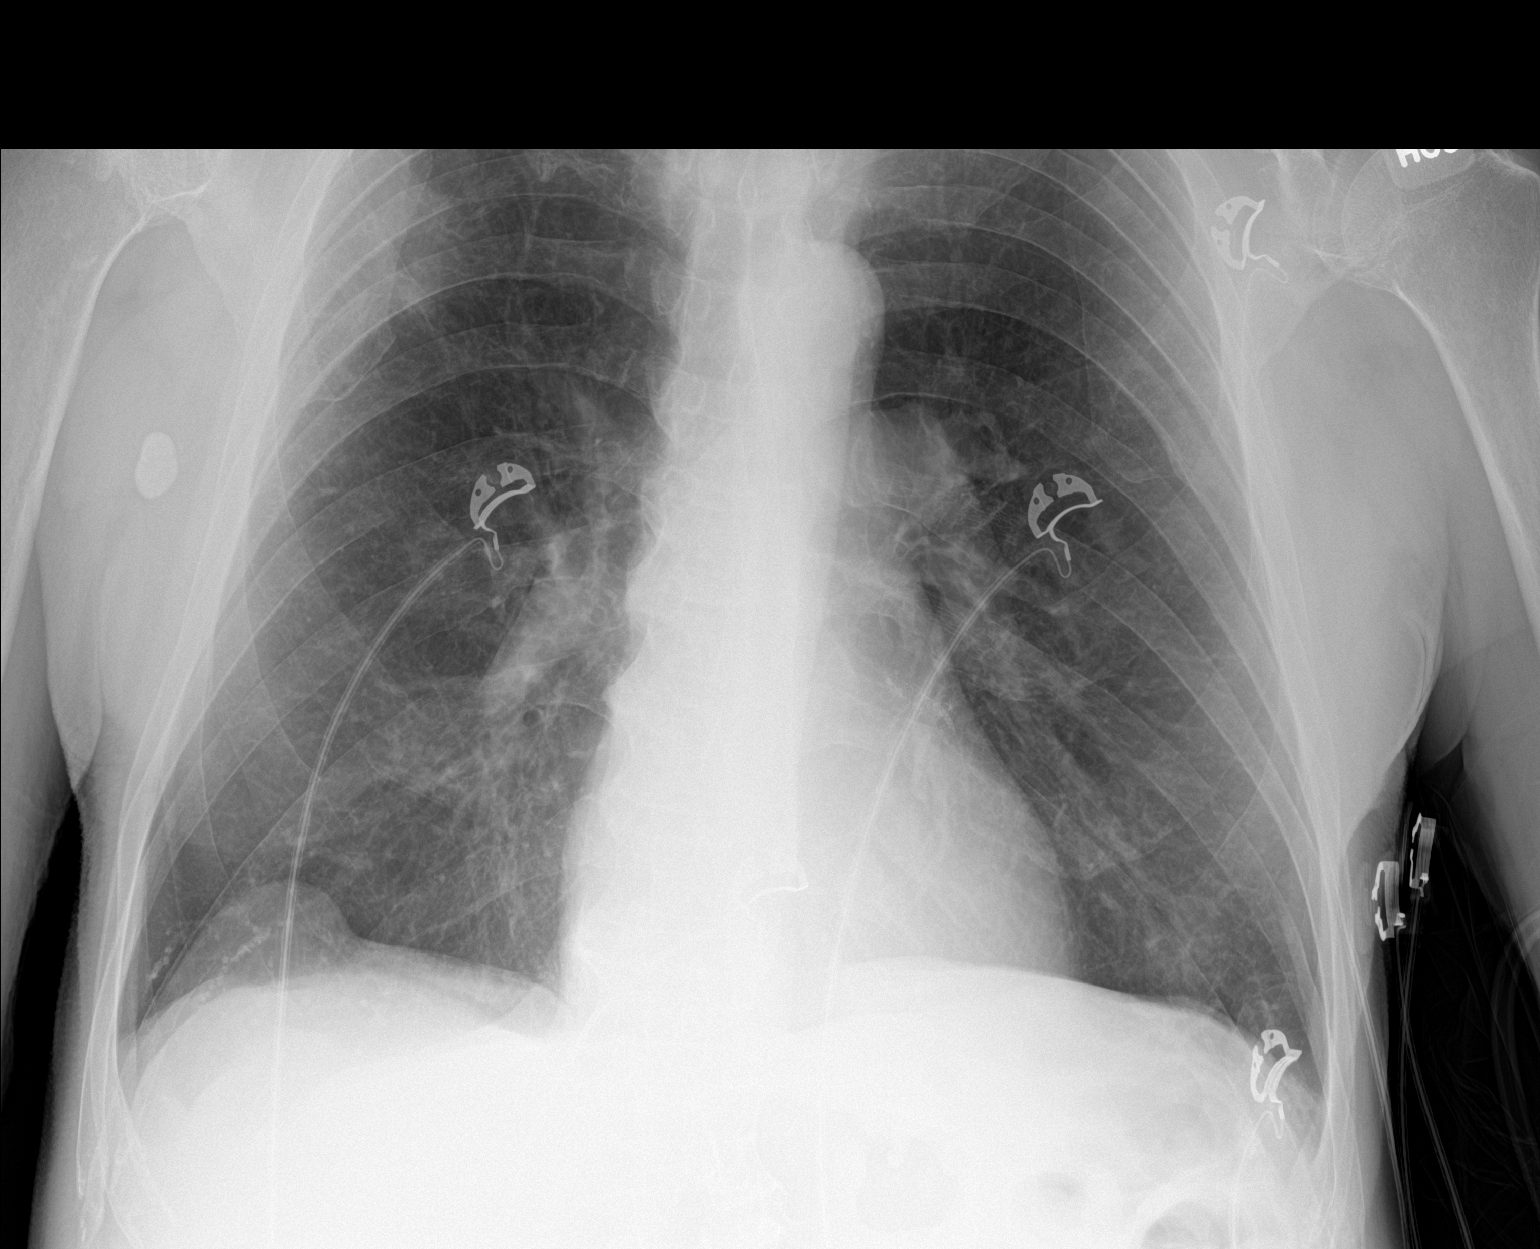

[2 of 2 positions shown; findings below may reference images not displayed]

FINDINGS: Previously seen right upper lobe consolidation has improved slightly
since prior study. Left lung clear. Heart is normal size. No
effusions or acute bony abnormality.
IMPRESSION: Right upper lobe infiltrate again noted, slightly improved since
prior study.

## 2019-02-10 MED ORDER — IPRATROPIUM-ALBUTEROL 0.5-2.5 (3) MG/3ML IN SOLN
3.0000 mL | Freq: Once | RESPIRATORY_TRACT | Status: AC
  Administered 2019-02-10: 3 mL via RESPIRATORY_TRACT
  Filled 2019-02-10: qty 3

## 2019-02-10 MED ORDER — SODIUM CHLORIDE 0.9% FLUSH
3.0000 mL | Freq: Once | INTRAVENOUS | Status: AC
  Administered 2019-02-10: 3 mL via INTRAVENOUS

## 2019-02-10 NOTE — ED Triage Notes (Signed)
Pt arrived from home with increased SOB x3 days. Pt with hx/o COPD. Pt received solumedrol as well as 2 DUo nebulizer treatments in route to ED. Pt on chronic O2 at 2-3L. Pt 100% at 3L on arrival. No fever reported.

## 2019-02-10 NOTE — ED Provider Notes (Signed)
[facility] Emergency Department Provider Note  ____________________________________________   First MD Initiated Contact with Patient 02/10/19 2300     (approximate)  I have reviewed the triage vital signs and the nursing notes.   HISTORY  Chief Complaint Shortness of Breath    HPI Leslie Sims is a 68 y.o. male with medical history as listed below which notably includes COPD on 2 to 3 L of oxygen at all times at home.  He presents for evaluation of gradually worsening shortness of breath over the last several days.  He was seen in this emergency department about 2 weeks ago and admitted for healthcare associated pneumonia in the right upper lobe.  He was discharged on Omnicef and says that he finish his course of outpatient antibiotics.  He felt better for little while but he started feeling worse again.  He does not feel as bad as he did the last time he reports his symptoms are at least moderate in severity.  Exertion makes his symptoms worse and nothing in particular makes it better.  He has had a little bit of a cough and acknowledges that he is still smoking.  He denies fever/chills, chest pain, nausea, vomiting, abdominal pain.   He has not been on steroids since his last hospitalization.      Past Medical History:  Diagnosis Date  . Allergic rhinitis   . Anxiety   . Back pain   . Colon polyps 08/2012   colonoscopy; multiple colon polyps; repeat colonoscopy in one year.  [name] COPD (chronic obstructive pulmonary disease) ([place])   . Coronary artery disease 11/2009   a. late presenting ant MI; b. LHC 100% pLAD s/p PCI/DES, 99% mRCA s/p PCI/DES, EF 35%; c. nuclear stress test 05/13: prior ant/inf infarcts w/o ischemia, EF 43%; d. LHC 02/14: widely patent stents with no other obs dz, EF 40%; e. LHC 11/17: LM nl, mLAD 10%, patent LAD stent, dLAD 20%, p-mRCA 10%, mRCA 40%, patent RCA stent, EF 35-45%   . Depression   . GERD (gastroesophageal reflux  disease)   . Headache(784.0)   . Helicobacter pylori (H. pylori)   . Hemorrhoids   . Hernia    inguinal  . Hyperlipidemia   . Hypertension   . Insomnia   . Ischemic cardiomyopathy   . Myalgia   . Peptic ulcer   . Pulmonary nodules   . ST elevation (STEMI) myocardial infarction involving left anterior descending coronary artery ([place]) 11/2009   a. s/p PCI to the LAD  . Status post dilation of esophageal narrowing 2000  . Tinea pedis   . Wears dentures    partial upper    Patient Active Problem List   Diagnosis Date Noted  . HCAP (healthcare-associated pneumonia) 01/28/2019  . COPD with acute exacerbation ([place]) 12/31/2018  . Acute respiratory failure with hypoxia ([place]) 09/29/2018  . Acute respiratory failure with hypoxia and hypercapnia ([place]) 09/13/2018  . Acute on chronic respiratory failure with hypoxia and hypercapnia ([place]) 08/24/2018  . Chest pain 07/02/2018  . Grief reaction 01/13/2018  . COPD with hypoxia ([place]) 12/25/2017  . Unstable angina ([place])   . Benign neoplasm of ascending colon   . Benign neoplasm of descending colon   . Benign neoplasm of sigmoid colon   . Personal history of colonic polyps   . Pulmonary nodules/lesions, multiple 01/05/2015  . Bruit of left carotid artery 11/04/2014  . Sleep disorder 07/18/2013  . Seasonal and perennial allergic rhinitis 05/29/2013  .  Bradycardia 04/20/2011  . SMOKER 07/30/2010  . CAD, NATIVE VESSEL 04/23/2010  . Hyperlipidemia 03/24/2010  . DEPRESSION/ANXIETY 03/24/2010  . Chronic systolic heart failure ([place]) 03/24/2010  . COPD, very severe ([place]) 03/24/2010  . GERD 03/24/2010  . Essential hypertension 11/21/2009    Past Surgical History:  Procedure Laterality Date  . Admission  12/20/2012   COPD exacerbation.  [place].  [name] CARDIAC CATHETERIZATION    . CARDIAC CATHETERIZATION  02/05/13   [facility]  . CARDIAC CATHETERIZATION  10/14   [facility] : patent stents with no change in anatomy. EF: 40$  . CARDIAC CATHETERIZATION Left  11/12/2016   Procedure: Left Heart Cath and Coronary Angiography;  Surgeon: Wellington Hampshire, MD;  Location: [place] CV LAB;  Service: Cardiovascular;  Laterality: Left;  . COLONOSCOPY    . COLONOSCOPY WITH PROPOFOL N/A 05/18/2016   Procedure: COLONOSCOPY WITH PROPOFOL;  Surgeon: Lucilla Lame, MD;  Location: [facility] ENDOSCOPY;  Service: Endoscopy;  Laterality: N/A;  . CORONARY ANGIOPLASTY WITH STENT PLACEMENT  09/2009   LAD 3.0 X23 mm Xience DES, RCA: 4.0 X 15 mm Xience DES  . ELECTROMAGNETIC NAVIGATION BROCHOSCOPY N/A 10/27/2017   Procedure: ELECTROMAGNETIC NAVIGATION BRONCHOSCOPY;  Surgeon: Flora Lipps, MD;  Location: [facility] ORS;  Service: Cardiopulmonary;  Laterality: N/A;  . ESOPHAGEAL DILATION    . ESOPHAGOGASTRODUODENOSCOPY  2008  . ESOPHAGOGASTRODUODENOSCOPY  08/20/2012  . HERNIA REPAIR  07/20/2012   L inguinal hernia repair  . PENILE PROSTHESIS IMPLANT      Prior to Admission medications   Medication Sig Start Date End Date Taking? Authorizing Provider  acetaminophen (TYLENOL) 325 MG tablet Take 2 tablets (650 mg total) by mouth every 6 (six) hours as needed for mild pain (or Fever >/= 101). 08/26/18  Yes Gouru, Aruna, MD  albuterol (PROVENTIL HFA;VENTOLIN HFA) 108 (90 Base) MCG/ACT inhaler Inhale 2 puffs into the lungs every 6 (six) hours as needed for wheezing or shortness of breath. 11/01/18  Yes Alfred Levins, [place], MD  ARIPiprazole (ABILIFY) 5 MG tablet Take 5 mg by mouth daily. 07/26/18  Yes [provider]  aspirin 81 MG tablet Take 81 mg by mouth daily.     Yes [provider]  atorvastatin (LIPITOR) 40 MG tablet Take 1 tablet (40 mg total) by mouth daily. 03/03/18  Yes [name], MD  Fluticasone-Salmeterol (ADVAIR) 500-50 MCG/DOSE AEPB Inhale 1 puff into the lungs every 12 (twelve) hours. 11/29/18  Yes [provider]  furosemide (LASIX) 20 MG tablet Take 1 tablet (20 mg total) by mouth daily. 07/07/18 09/30/19 Yes [name], MD    guaiFENesin-dextromethorphan (ROBITUSSIN DM) 100-10 MG/5ML syrup Take 5 mLs by mouth every 4 (four) hours as needed for cough. 01/31/19  Yes Gouru, Illene [place], MD  hydrOXYzine (VISTARIL) 25 MG capsule Take 25 mg by mouth 2 (two) times daily as needed for anxiety or itching.  06/05/18  Yes [provider]  ipratropium-albuterol (DUONEB) 0.5-2.5 (3) MG/3ML SOLN Take 3 mLs by nebulization 4 (four) times daily. DX:J44.9 12/04/18  Yes Wilhelmina Mcardle, MD  LORazepam (ATIVAN) 1 MG tablet Take 1 tablet (1 mg total) by mouth every 8 (eight) hours as needed for anxiety. 07/07/18  Yes [name], MD  mirtazapine (REMERON) 30 MG tablet Take 1 tablet (30 mg total) by mouth at bedtime. 06/24/18  Yes [name], MD  Multiple Vitamin (MULTIVITAMIN) capsule Take 1 capsule by mouth daily.   Yes [provider]  nitroGLYCERIN (NITROSTAT) 0.4 MG SL tablet Place 1 tablet (  0.4 mg total) under the tongue every 5 (five) minutes as needed. 10/17/17  Yes Wellington Hampshire, MD  OLANZapine (ZYPREXA) 2.5 MG tablet Take 2.5 mg by mouth at bedtime. 01/19/19  Yes [provider]  sertraline (ZOLOFT) 100 MG tablet Take 200 mg by mouth daily.  06/28/18  Yes [provider]  sucralfate (CARAFATE) 1 GM/10ML suspension Take 10 mLs (1 g total) by mouth 4 (four) times daily -  with meals and at bedtime. Patient taking differently: Take 1 g by mouth 4 (four) times daily as needed.  05/16/18  Yes [name], MD  budesonide (PULMICORT) 0.25 MG/2ML nebulizer solution Inhale 0.25 mg into the lungs daily. 12/25/18 12/25/19  [provider]  feeding supplement, ENSURE ENLIVE, (ENSURE ENLIVE) LIQD Take 237 mLs by mouth 3 (three) times daily between meals. 10/01/18   Epifanio Lesches, MD    Allergies Prozac [fluoxetine hcl]; Effexor xr [venlafaxine hcl er]; and Wellbutrin [bupropion]  Family History  Problem Relation Age of Onset  . Heart attack Brother        Brother #1  . Diabetes Brother    . Hypertension Brother        #3  . Coronary artery disease Father 16       deceased  . Heart attack Father   . Diabetes Father   . Heart disease Father   . COPD Mother 71       deceased  . Alcohol abuse Sister        polysubstance abuse  . COPD Sister   . Lung cancer Sister   . Alcohol abuse Sister        polysubstance abuse  . Penile cancer Brother   . Diabetes Brother     Social History Social History   Tobacco Use  . Smoking status: Current Every Day Smoker    Packs/day: 1.00    Years: 42.00    Pack years: 42.00    Types: Cigarettes  . Smokeless tobacco: Never Used  Substance Use Topics  . Alcohol use: No    Alcohol/week: 0.0 standard drinks  . Drug use: No    Review of Systems Constitutional: No fever/chills Eyes: No visual changes. ENT: No sore throat. Cardiovascular: Denies chest pain. Respiratory: +shortness of breath. Gastrointestinal: No abdominal pain.  No nausea, no vomiting.  No diarrhea.  No constipation. Genitourinary: Negative for dysuria. Musculoskeletal: Negative for neck pain.  Negative for back pain. Integumentary: Negative for rash. Neurological: Negative for headaches, focal weakness or numbness.   ____________________________________________   PHYSICAL EXAM:  VITAL SIGNS: ED Triage Vitals  Enc Vitals Group     BP 02/10/19 2200 115/75     Pulse Rate 02/10/19 2200 83     Resp 02/10/19 2206 (!) 26     Temp 02/10/19 2206 98.3 F (36.8 C)     Temp Source 02/10/19 2206 Oral     SpO2 02/10/19 2200 97 %     Weight --      Height --      Head Circumference --      Peak Flow --      Pain Score 02/10/19 2206 0     Pain Loc --      Pain Edu? --      Excl. in [place]? --     Constitutional: Alert and oriented.  Chronic ill appearance but is not in acute respiratory distress at this time. Eyes: Conjunctivae are normal.  Head: Atraumatic. Nose: No congestion/rhinnorhea. Mouth/Throat: Mucous  membranes are moist. Neck: No stridor.  No  meningeal signs.   Cardiovascular: Normal rate, regular rhythm. Good peripheral circulation. Grossly normal heart sounds. Respiratory: Normal respiratory effort.  No retractions. Lungs CTAB.  Patient has coughing episodes when taking deep breath but his lung sounds are clear and he is moving good air with inspiration and expiration. Gastrointestinal: Soft and nontender. No distention.  Musculoskeletal: No lower extremity tenderness nor edema. No gross deformities of extremities. Neurologic:  Normal speech and language. No gross focal neurologic deficits are appreciated.  Skin:  Skin is warm, dry and intact. No rash noted. Psychiatric: Mood and affect are normal. Speech and behavior are normal.  ____________________________________________   LABS (all labs ordered are listed, but only abnormal results are displayed)  Labs Reviewed  BASIC METABOLIC PANEL - Abnormal; Notable for the following components:      Result Value   Chloride 95 (*)    CO2 33 (*)    Glucose, Bld 125 (*)    Creatinine, Ser 0.60 (*)    All other components within normal limits  CBC - Abnormal; Notable for the following components:   WBC 10.9 (*)    RBC 4.07 (*)    Hemoglobin 11.9 (*)    HCT 38.1 (*)    All other components within normal limits  CULTURE, BLOOD (ROUTINE X 2)  CULTURE, BLOOD (ROUTINE X 2)  MRSA PCR SCREENING  TROPONIN I  LACTIC ACID, PLASMA  PROCALCITONIN  PROTIME-INR  TSH   ____________________________________________  EKG  ED ECG REPORT I, Hinda Kehr, the attending physician, personally viewed and interpreted this ECG.  Date: 02/10/2019 EKG Time: 22: 11 Rate: 79 Rhythm: normal sinus rhythm QRS Axis: Left axis deviation Intervals: normal ST/T Wave abnormalities: Non-specific ST segment / T-wave changes, but no clear evidence of acute ischemia. Narrative Interpretation: no definitive evidence of acute ischemia; does not meet STEMI  criteria.   ____________________________________________  RADIOLOGY I, Hinda Kehr, personally viewed and evaluated these images (plain radiographs) as part of my medical decision making, as well as reviewing the written report by the radiologist.  ED MD interpretation: Persistent but slightly improved right upper lobe infiltrate compared to prior.  Official radiology report(s): Ct Chest Wo Contrast  Result Date: 02/11/2019 CLINICAL DATA:  COPD exacerbation, short of breath EXAM: CT CHEST WITHOUT CONTRAST TECHNIQUE: Multidetector CT imaging of the chest was performed following the standard protocol without IV contrast. COMPARISON:  Chest x-ray 02/10/2019, CT chest 10/19/2018 09/13/2018, 02/01/2018, 12/07/2017, 10/26/2017, PET-CT 10/13/2017 FINDINGS: Cardiovascular: Limited without intravenous contrast. Nonaneurysmal aorta. Moderate aortic atherosclerosis. Coronary vascular calcification. Normal heart size. No pericardial effusion Mediastinum/Nodes: Midline trachea. No thyroid mass. No significant adenopathy. Esophagus within normal limits. Lungs/Pleura: Moderate severe emphysema. Stable 10 x 15 mm left upper lobe pulmonary nodule, series 3, image number 44. Stable 5 mm left apical lung nodule, series 3, image number 20. Stable 4 mm left lower lobe pulmonary nodule, series 3, image number 69. Small foci of ground-glass density within the anterior and posterior right upper lobe. Interval development of spiculated mass within the right upper lobe, measuring 1.8 x 2.8 cm. This is contiguous with the pleural surface and appears to contain a small amount of central cavitation. Upper Abdomen: Stable 2.7 x 3 cm left adrenal adenoma. Stable 15 mm right adrenal adenoma. No acute abnormality. Musculoskeletal: No acute or suspicious osseous abnormality. IMPRESSION: 1. Progressed masslike consolidation within the right upper lobe in the region of previous waxing and waning nodule, this is contiguous with  the  pleural surface and demonstrates spiculated margin and probable small focus of central cavitation. Findings could be secondary to infection superimposed on chronic nodule however cavitary neoplasm is also a concern. Pulmonary consultation is suggested. 2. There are multiple additional lung nodules which have not significantly changed. 3. Small foci of ground-glass density in the right upper lobe may reflect small foci of infection. 4. Moderate severe emphysema 5. Stable bilateral adrenal adenomas Aortic Atherosclerosis (ICD10-I70.0) and Emphysema (ICD10-J43.9). Electronically Signed   By: Donavan Foil M.D.   On: 02/11/2019 00:29   Dg Chest Portable 1 View  Result Date: 02/10/2019 CLINICAL DATA:  Shortness of Breath EXAM: PORTABLE CHEST 1 VIEW COMPARISON:  01/28/2019 FINDINGS: Previously seen right upper lobe consolidation has improved slightly since prior study. Left lung clear. Heart is normal size. No effusions or acute bony abnormality. IMPRESSION: Right upper lobe infiltrate again noted, slightly improved since prior study. Electronically Signed   By: Rolm Baptise M.D.   On: 02/10/2019 22:24    ____________________________________________   PROCEDURES  Critical Care performed: No   Procedure(s) performed:   Procedures   ____________________________________________   INITIAL IMPRESSION / MDM / ASSESSMENT AND PLAN / ED COURSE  As part of my medical decision making, I reviewed the following data within the [place] notes reviewed and incorporated, Labs reviewed , EKG interpreted , Old chart reviewed, Radiograph reviewed  and Notes from prior ED visits       Differential diagnosis includes, but is not limited to, persistent healthcare associated pneumonia, COPD exacerbation, PE, pneumothorax.  The patient's chest x-ray looks slightly improved from before but does have a persistent right upper lobe pneumonia.  It is possible that it improved but is now  worsening again given the worsening of his symptoms over the last few days.  He was unclear about how long he took antibiotics after being discharged from the hospital but it is likely he has been off of it for at least a few days.  His lung sounds are relatively clear for someone with advanced COPD but I am giving him a DuoNeb for his comfort.  It is notable that he is not retracting or using any accessory muscles when he breathes.  He has no signs of sepsis and his lab work is generally reassuring with a normal troponin and an extremely mild leukocytosis of 10.9.  Given his worsening symptoms, however, and his severe lung disease at baseline, and the question of whether or not the pneumonia is persistent or worsening and whether or not he could have infection that is not visible on the chest x-ray, I am obtaining a CT chest without contrast for further evaluation characterization of the complicated and unresolved pneumonia.  I will reassess after the scan to determine if he would be appropriate for another course of outpatient antibiotics such as doxycycline or if he will require inpatient treatment.  He agrees with the plan.  Of note, the last time he was in the emergency department was hospitalized he was hypoxemic in spite of 3 L of oxygen.  At this time he is on his home oxygen of about 3 L and he is satting 97% which is reassuring.  He is not tachycardic and not tachypneic.  Clinical Course as of Feb 11 635  Sun Feb 11, 2019  0151 The patient's CT scan is concerning for a cavitated lesion that could represent a complicated healthcare associated pneumonia versus possibly a postobstructive pneumonia due to a  neoplasm.  I checked on the patient and he is resting comfortably although reports she still occasionally feels short of breath in spite of his oxygen.  I added on blood cultures, procalcitonin, lactic acid, and have ordered cefepime 2 g IV and vancomycin 1 g IV for H CAP treatment.  He agrees  with the plan to stay in the hospital for pulmonology consultation and further evaluation and treatment.  CT Chest Wo Contrast [CF]  0153 I spoke by phone with Dr. Jannifer Franklin with the hospitalist service and the hospitalist service will admit.   [CF]    Clinical Course User Index [CF] Hinda Kehr, MD    ____________________________________________  FINAL CLINICAL IMPRESSION(S) / ED DIAGNOSES  Final diagnoses:  Healthcare-associated pneumonia     MEDICATIONS GIVEN DURING THIS VISIT:  Medications  aspirin chewable tablet 81 mg (has no administration in time range)  nitroGLYCERIN (NITROSTAT) SL tablet 0.4 mg (has no administration in time range)  atorvastatin (LIPITOR) tablet 40 mg (has no administration in time range)  sucralfate (CARAFATE) 1 GM/10ML suspension 1 g (has no administration in time range)  mirtazapine (REMERON) tablet 30 mg (has no administration in time range)  sertraline (ZOLOFT) tablet 200 mg (has no administration in time range)  furosemide (LASIX) tablet 20 mg (has no administration in time range)  LORazepam (ATIVAN) tablet 1 mg (has no administration in time range)  ARIPiprazole (ABILIFY) tablet 5 mg (has no administration in time range)  feeding supplement (ENSURE ENLIVE) (ENSURE ENLIVE) liquid 237 mL (has no administration in time range)  mometasone-formoterol (DULERA) 200-5 MCG/ACT inhaler 2 puff (has no administration in time range)  guaiFENesin-dextromethorphan (ROBITUSSIN DM) 100-10 MG/5ML syrup 5 mL (has no administration in time range)  OLANZapine (ZYPREXA) tablet 2.5 mg (has no administration in time range)  enoxaparin (LOVENOX) injection 40 mg (has no administration in time range)  0.9 %  sodium chloride infusion ( Intravenous New Bag/Given 02/11/19 0536)  acetaminophen (TYLENOL) tablet 650 mg (has no administration in time range)    Or  acetaminophen (TYLENOL) suppository 650 mg (has no administration in time range)  docusate sodium (COLACE) capsule  100 mg (has no administration in time range)  ondansetron (ZOFRAN) tablet 4 mg (has no administration in time range)    Or  ondansetron (ZOFRAN) injection 4 mg (has no administration in time range)  vancomycin (VANCOCIN) IVPB 750 mg/150 ml premix (has no administration in time range)  ceFEPIme (MAXIPIME) 2 g in sodium chloride 0.9 % 100 mL IVPB (has no administration in time range)  multivitamin with minerals tablet 1 tablet (has no administration in time range)  hydrOXYzine (ATARAX/VISTARIL) tablet 25 mg (has no administration in time range)  ipratropium-albuterol (DUONEB) 0.5-2.5 (3) MG/3ML nebulizer solution 3 mL (has no administration in time range)  budesonide (PULMICORT) nebulizer solution 0.25 mg (has no administration in time range)  sodium chloride flush (NS) 0.9 % injection 3 mL (3 mLs Intravenous Given 02/10/19 2210)  ipratropium-albuterol (DUONEB) 0.5-2.5 (3) MG/3ML nebulizer solution 3 mL (3 mLs Nebulization Given 02/10/19 2331)  sodium chloride 0.9 % bolus 500 mL (0 mLs Intravenous Stopped 02/11/19 0316)  vancomycin (VANCOCIN) IVPB 1000 mg/200 mL premix (0 mg Intravenous Stopped 02/11/19 0352)  ceFEPIme (MAXIPIME) 2 g in sodium chloride 0.9 % 100 mL IVPB (0 g Intravenous Stopped 02/11/19 0239)     ED Discharge Orders    None       Note:  This document was prepared using Dragon voice recognition software and may include unintentional dictation  errors.   Hinda Kehr, MD 02/11/19 [phone]

## 2019-02-10 NOTE — ED Notes (Signed)
xray at bedside.  

## 2019-02-11 ENCOUNTER — Other Ambulatory Visit: Payer: Self-pay

## 2019-02-11 DIAGNOSIS — F17210 Nicotine dependence, cigarettes, uncomplicated: Secondary | ICD-10-CM | POA: Diagnosis present

## 2019-02-11 DIAGNOSIS — Z7951 Long term (current) use of inhaled steroids: Secondary | ICD-10-CM | POA: Diagnosis not present

## 2019-02-11 DIAGNOSIS — E785 Hyperlipidemia, unspecified: Secondary | ICD-10-CM | POA: Diagnosis present

## 2019-02-11 DIAGNOSIS — Z8701 Personal history of pneumonia (recurrent): Secondary | ICD-10-CM | POA: Diagnosis not present

## 2019-02-11 DIAGNOSIS — F419 Anxiety disorder, unspecified: Secondary | ICD-10-CM | POA: Diagnosis present

## 2019-02-11 DIAGNOSIS — F329 Major depressive disorder, single episode, unspecified: Secondary | ICD-10-CM | POA: Diagnosis present

## 2019-02-11 DIAGNOSIS — Z801 Family history of malignant neoplasm of trachea, bronchus and lung: Secondary | ICD-10-CM | POA: Diagnosis not present

## 2019-02-11 DIAGNOSIS — J440 Chronic obstructive pulmonary disease with acute lower respiratory infection: Secondary | ICD-10-CM | POA: Diagnosis present

## 2019-02-11 DIAGNOSIS — Y95 Nosocomial condition: Secondary | ICD-10-CM | POA: Diagnosis present

## 2019-02-11 DIAGNOSIS — Z9981 Dependence on supplemental oxygen: Secondary | ICD-10-CM | POA: Diagnosis not present

## 2019-02-11 DIAGNOSIS — I2510 Atherosclerotic heart disease of native coronary artery without angina pectoris: Secondary | ICD-10-CM | POA: Diagnosis present

## 2019-02-11 DIAGNOSIS — Z515 Encounter for palliative care: Secondary | ICD-10-CM | POA: Diagnosis not present

## 2019-02-11 DIAGNOSIS — Z825 Family history of asthma and other chronic lower respiratory diseases: Secondary | ICD-10-CM | POA: Diagnosis not present

## 2019-02-11 DIAGNOSIS — Z955 Presence of coronary angioplasty implant and graft: Secondary | ICD-10-CM | POA: Diagnosis not present

## 2019-02-11 DIAGNOSIS — I252 Old myocardial infarction: Secondary | ICD-10-CM | POA: Diagnosis not present

## 2019-02-11 DIAGNOSIS — J9621 Acute and chronic respiratory failure with hypoxia: Secondary | ICD-10-CM | POA: Diagnosis present

## 2019-02-11 DIAGNOSIS — J449 Chronic obstructive pulmonary disease, unspecified: Secondary | ICD-10-CM | POA: Diagnosis not present

## 2019-02-11 DIAGNOSIS — Z833 Family history of diabetes mellitus: Secondary | ICD-10-CM | POA: Diagnosis not present

## 2019-02-11 DIAGNOSIS — Z7189 Other specified counseling: Secondary | ICD-10-CM | POA: Diagnosis not present

## 2019-02-11 DIAGNOSIS — I10 Essential (primary) hypertension: Secondary | ICD-10-CM | POA: Diagnosis present

## 2019-02-11 DIAGNOSIS — Z8249 Family history of ischemic heart disease and other diseases of the circulatory system: Secondary | ICD-10-CM | POA: Diagnosis not present

## 2019-02-11 DIAGNOSIS — J441 Chronic obstructive pulmonary disease with (acute) exacerbation: Secondary | ICD-10-CM | POA: Diagnosis present

## 2019-02-11 DIAGNOSIS — Z7982 Long term (current) use of aspirin: Secondary | ICD-10-CM | POA: Diagnosis not present

## 2019-02-11 DIAGNOSIS — J189 Pneumonia, unspecified organism: Secondary | ICD-10-CM | POA: Diagnosis not present

## 2019-02-11 DIAGNOSIS — R911 Solitary pulmonary nodule: Secondary | ICD-10-CM | POA: Diagnosis not present

## 2019-02-11 LAB — MRSA PCR SCREENING: MRSA by PCR: NEGATIVE

## 2019-02-11 LAB — LACTIC ACID, PLASMA: Lactic Acid, Venous: 1.1 mmol/L (ref 0.5–1.9)

## 2019-02-11 LAB — TSH: TSH: 2.917 u[IU]/mL (ref 0.350–4.500)

## 2019-02-11 LAB — PROTIME-INR
INR: 0.95
Prothrombin Time: 12.6 seconds (ref 11.4–15.2)

## 2019-02-11 LAB — PROCALCITONIN: Procalcitonin: <0.1 ng/mL

## 2019-02-11 MED ORDER — ASPIRIN 81 MG PO CHEW
81.0000 mg | CHEWABLE_TABLET | Freq: Every day | ORAL | Status: DC
  Administered 2019-02-11 – 2019-02-14 (×4): 81 mg via ORAL
  Filled 2019-02-11 (×4): qty 1

## 2019-02-11 MED ORDER — ONDANSETRON HCL 4 MG/2ML IJ SOLN: 4.0000 mg | Freq: Four times a day (QID) | INTRAMUSCULAR | Status: DC | PRN

## 2019-02-11 MED ORDER — BUDESONIDE 0.25 MG/2ML IN SUSP
0.2500 mg | Freq: Every day | RESPIRATORY_TRACT | Status: DC
  Administered 2019-02-11 – 2019-02-14 (×4): 0.25 mg via RESPIRATORY_TRACT
  Filled 2019-02-11 (×4): qty 2

## 2019-02-11 MED ORDER — SODIUM CHLORIDE 0.9 % IV SOLN
INTRAVENOUS | Status: DC
  Administered 2019-02-11 – 2019-02-14 (×6): via INTRAVENOUS

## 2019-02-11 MED ORDER — ARIPIPRAZOLE 5 MG PO TABS
5.0000 mg | ORAL_TABLET | Freq: Every day | ORAL | Status: DC
  Administered 2019-02-11 – 2019-02-14 (×4): 5 mg via ORAL
  Filled 2019-02-11 (×4): qty 1

## 2019-02-11 MED ORDER — ACETAMINOPHEN 650 MG RE SUPP: 650.0000 mg | Freq: Four times a day (QID) | RECTAL | Status: DC | PRN

## 2019-02-11 MED ORDER — MOMETASONE FURO-FORMOTEROL FUM 200-5 MCG/ACT IN AERO
2.0000 | INHALATION_SPRAY | Freq: Two times a day (BID) | RESPIRATORY_TRACT | Status: DC
  Administered 2019-02-11 – 2019-02-14 (×7): 2 via RESPIRATORY_TRACT
  Filled 2019-02-11: qty 8.8

## 2019-02-11 MED ORDER — ACETAMINOPHEN 325 MG PO TABS: 650.0000 mg | ORAL_TABLET | Freq: Four times a day (QID) | ORAL | Status: DC | PRN

## 2019-02-11 MED ORDER — SODIUM CHLORIDE 0.9 % IV SOLN
2.0000 g | Freq: Three times a day (TID) | INTRAVENOUS | Status: DC
  Administered 2019-02-11 – 2019-02-14 (×10): 2 g via INTRAVENOUS
  Filled 2019-02-11 (×12): qty 2

## 2019-02-11 MED ORDER — NICOTINE 21 MG/24HR TD PT24
21.0000 mg | MEDICATED_PATCH | Freq: Every day | TRANSDERMAL | Status: DC
  Administered 2019-02-11 – 2019-02-14 (×4): 21 mg via TRANSDERMAL
  Filled 2019-02-11 (×4): qty 1

## 2019-02-11 MED ORDER — SODIUM CHLORIDE 0.9 % IV BOLUS (SEPSIS)
500.0000 mL | Freq: Once | INTRAVENOUS | Status: AC
  Administered 2019-02-11: 500 mL via INTRAVENOUS

## 2019-02-11 MED ORDER — NITROGLYCERIN 0.4 MG SL SUBL: 0.4000 mg | SUBLINGUAL_TABLET | SUBLINGUAL | Status: DC | PRN

## 2019-02-11 MED ORDER — IPRATROPIUM-ALBUTEROL 0.5-2.5 (3) MG/3ML IN SOLN: 3.0000 mL | Freq: Four times a day (QID) | RESPIRATORY_TRACT | Status: DC

## 2019-02-11 MED ORDER — ENOXAPARIN SODIUM 40 MG/0.4ML ~~LOC~~ SOLN
40.0000 mg | SUBCUTANEOUS | Status: DC
  Administered 2019-02-11 – 2019-02-13 (×3): 40 mg via SUBCUTANEOUS
  Filled 2019-02-11 (×4): qty 0.4

## 2019-02-11 MED ORDER — SUCRALFATE 1 GM/10ML PO SUSP: 1.0000 g | Freq: Four times a day (QID) | ORAL | Status: DC | PRN

## 2019-02-11 MED ORDER — BUDESONIDE 0.25 MG/2ML IN SUSP: 0.2500 mg | Freq: Every day | RESPIRATORY_TRACT | Status: DC

## 2019-02-11 MED ORDER — LORAZEPAM 1 MG PO TABS
1.0000 mg | ORAL_TABLET | Freq: Three times a day (TID) | ORAL | Status: DC | PRN
  Administered 2019-02-11: 1 mg via ORAL
  Filled 2019-02-11: qty 1

## 2019-02-11 MED ORDER — VANCOMYCIN HCL IN DEXTROSE 750-5 MG/150ML-% IV SOLN
750.0000 mg | Freq: Two times a day (BID) | INTRAVENOUS | Status: DC
  Administered 2019-02-11 – 2019-02-12 (×3): 750 mg via INTRAVENOUS
  Filled 2019-02-11 (×4): qty 150

## 2019-02-11 MED ORDER — MULTIVITAMINS PO CAPS: 1.0000 | ORAL_CAPSULE | Freq: Every day | ORAL | Status: DC

## 2019-02-11 MED ORDER — HYDROXYZINE HCL 25 MG PO TABS: 25.0000 mg | ORAL_TABLET | Freq: Two times a day (BID) | ORAL | Status: DC | PRN

## 2019-02-11 MED ORDER — ORAL CARE MOUTH RINSE
15.0000 mL | Freq: Two times a day (BID) | OROMUCOSAL | Status: DC
  Administered 2019-02-11 – 2019-02-14 (×5): 15 mL via OROMUCOSAL

## 2019-02-11 MED ORDER — LORAZEPAM 1 MG PO TABS
1.0000 mg | ORAL_TABLET | ORAL | Status: DC | PRN
  Administered 2019-02-11 – 2019-02-13 (×4): 1 mg via ORAL
  Filled 2019-02-11 (×4): qty 1

## 2019-02-11 MED ORDER — OLANZAPINE 5 MG PO TABS
2.5000 mg | ORAL_TABLET | Freq: Every day | ORAL | Status: DC
  Administered 2019-02-11 – 2019-02-13 (×3): 2.5 mg via ORAL
  Filled 2019-02-11 (×4): qty 0.5

## 2019-02-11 MED ORDER — SERTRALINE HCL 50 MG PO TABS
200.0000 mg | ORAL_TABLET | Freq: Every day | ORAL | Status: DC
  Administered 2019-02-11 – 2019-02-14 (×4): 200 mg via ORAL
  Filled 2019-02-11 (×4): qty 4

## 2019-02-11 MED ORDER — VANCOMYCIN HCL IN DEXTROSE 1-5 GM/200ML-% IV SOLN
1000.0000 mg | Freq: Once | INTRAVENOUS | Status: AC
  Administered 2019-02-11: 1000 mg via INTRAVENOUS
  Filled 2019-02-11: qty 200

## 2019-02-11 MED ORDER — HYDROXYZINE PAMOATE 25 MG PO CAPS: 25.0000 mg | ORAL_CAPSULE | Freq: Two times a day (BID) | ORAL | Status: DC | PRN

## 2019-02-11 MED ORDER — GUAIFENESIN-DM 100-10 MG/5ML PO SYRP: 5.0000 mL | ORAL_SOLUTION | ORAL | Status: DC | PRN

## 2019-02-11 MED ORDER — FUROSEMIDE 40 MG PO TABS
20.0000 mg | ORAL_TABLET | Freq: Every day | ORAL | Status: DC
  Administered 2019-02-11 – 2019-02-14 (×4): 20 mg via ORAL
  Filled 2019-02-11 (×4): qty 1

## 2019-02-11 MED ORDER — DOCUSATE SODIUM 100 MG PO CAPS
100.0000 mg | ORAL_CAPSULE | Freq: Two times a day (BID) | ORAL | Status: DC
  Administered 2019-02-11 – 2019-02-14 (×7): 100 mg via ORAL
  Filled 2019-02-11 (×7): qty 1

## 2019-02-11 MED ORDER — IPRATROPIUM-ALBUTEROL 0.5-2.5 (3) MG/3ML IN SOLN
3.0000 mL | Freq: Four times a day (QID) | RESPIRATORY_TRACT | Status: DC
  Administered 2019-02-11 – 2019-02-14 (×14): 3 mL via RESPIRATORY_TRACT
  Filled 2019-02-11 (×13): qty 3

## 2019-02-11 MED ORDER — ONDANSETRON HCL 4 MG PO TABS: 4.0000 mg | ORAL_TABLET | Freq: Four times a day (QID) | ORAL | Status: DC | PRN

## 2019-02-11 MED ORDER — MIRTAZAPINE 15 MG PO TABS
30.0000 mg | ORAL_TABLET | Freq: Every day | ORAL | Status: DC
  Administered 2019-02-11 – 2019-02-13 (×3): 30 mg via ORAL
  Filled 2019-02-11 (×3): qty 2

## 2019-02-11 MED ORDER — ATORVASTATIN CALCIUM 20 MG PO TABS
40.0000 mg | ORAL_TABLET | Freq: Every day | ORAL | Status: DC
  Administered 2019-02-11 – 2019-02-14 (×4): 40 mg via ORAL
  Filled 2019-02-11 (×4): qty 2

## 2019-02-11 MED ORDER — ADULT MULTIVITAMIN W/MINERALS CH
1.0000 | ORAL_TABLET | Freq: Every day | ORAL | Status: DC
  Administered 2019-02-11 – 2019-02-14 (×4): 1 via ORAL
  Filled 2019-02-11 (×4): qty 1

## 2019-02-11 MED ORDER — ENSURE ENLIVE PO LIQD
237.0000 mL | Freq: Three times a day (TID) | ORAL | Status: DC
  Administered 2019-02-11 – 2019-02-14 (×11): 237 mL via ORAL

## 2019-02-11 MED ORDER — SODIUM CHLORIDE 0.9 % IV SOLN
2.0000 g | Freq: Once | INTRAVENOUS | Status: AC
  Administered 2019-02-11: 2 g via INTRAVENOUS
  Filled 2019-02-11: qty 2

## 2019-02-11 NOTE — Progress Notes (Signed)
Advanced care plan.  Purpose of the Encounter: CODE STATUS  Parties in Attendance: Patient himself  Patient's Decision Capacity: Intact Subjective/Patient's story:  Patient 68 year old with chronic respiratory failure admitted with acute respiratory failure  Objective/Medical story   Discuss with the patient regarding his desires for cardiac and pulmonary resuscitation and further goals of care  Goals of care determination:   Patient states that he would like everything to be done and wants to be a full code  CODE STATUS: Full code   Time spent discussing advanced care planning: 16 minutes

## 2019-02-11 NOTE — ED Notes (Signed)
Dr. Diamond in to see pt.  

## 2019-02-11 NOTE — ED Notes (Signed)
ED TO INPATIENT HANDOFF REPORT  Name/Age/Gender Leslie Sims 68 y.o. male  Code Status Code Status History    Date Active Date Inactive Code Status Order ID Comments User Context   01/29/2019 0012 01/31/2019 1625 Full Code [id number]  Lance Coon, MD Inpatient   12/31/2018 1909 01/02/2019 1808 Full Code [id number]  [name], MD ED   10/01/2018 1243 10/01/2018 1759 Full Code [id number]  Lucious Groves, RN Inpatient   09/13/2018 1949 09/15/2018 1719 Full Code [id number]  Loletha Grayer, MD ED   08/25/2018 0140 08/26/2018 1737 Full Code [id number]  Harrie Foreman, MD Inpatient   07/02/2018 1903 07/03/2018 2216 Full Code [id number]  Hillary Bow, MD ED   12/26/2017 0129 12/30/2017 2106 Full Code [id number]  Amelia Jo, MD Inpatient      Home/SNF/Other Home  Chief Complaint shortness of breath  Level of Care/Admitting Diagnosis ED Disposition    ED Disposition Condition [place]: [facility] [[id number]]  Level of Care: Med-Surg [16]  Diagnosis: HCAP (healthcare-associated pneumonia) [[id number]]  Admitting Physician: Harrie Foreman [[id number]]  Attending Physician: Harrie Foreman [[id number]]  Estimated length of stay: past midnight tomorrow  Certification:: I certify this patient will need inpatient services for at least 2 midnights  PT Class (Do Not Modify): Inpatient [101]  PT Acc Code (Do Not Modify): Private [1]       Medical History Past Medical History:  Diagnosis Date  . Allergic rhinitis   . Anxiety   . Back pain   . Colon polyps 08/2012   colonoscopy; multiple colon polyps; repeat colonoscopy in one year.  [name] COPD (chronic obstructive pulmonary disease) ([place])   . Coronary artery disease 11/2009   a. late presenting ant MI; b. LHC 100% pLAD s/p PCI/DES, 99% mRCA s/p PCI/DES, EF 35%; c. nuclear stress test 05/13: prior ant/inf infarcts w/o ischemia, EF 43%; d. LHC 02/14: widely patent stents with no other obs dz, EF 40%;  e. LHC 11/17: LM nl, mLAD 10%, patent LAD stent, dLAD 20%, p-mRCA 10%, mRCA 40%, patent RCA stent, EF 35-45%   . Depression   . GERD (gastroesophageal reflux disease)   . Headache(784.0)   . Helicobacter pylori (H. pylori)   . Hemorrhoids   . Hernia    inguinal  . Hyperlipidemia   . Hypertension   . Insomnia   . Ischemic cardiomyopathy   . Myalgia   . Peptic ulcer   . Pulmonary nodules   . ST elevation (STEMI) myocardial infarction involving left anterior descending coronary artery ([place]) 11/2009   a. s/p PCI to the LAD  . Status post dilation of esophageal narrowing 2000  . Tinea pedis   . Wears dentures    partial upper    Allergies Allergies  Allergen Reactions  . Prozac [Fluoxetine Hcl] Shortness Of Breath  . Effexor Xr [Venlafaxine Hcl Er] Other (See Comments)    "Makes me feel funny"  . Wellbutrin [Bupropion] Other (See Comments)    "Makes me feel funny"    IV Location/Drains/Wounds Patient Lines/Drains/Airways Status   Active Line/Drains/Airways    Name:   Placement date:   Placement time:   Site:   Days:   Peripheral IV 02/10/19 Right Forearm   02/10/19    2206    Forearm   1          Labs/Imaging Results for orders placed or performed during the hospital encounter of 02/10/19 (from the past 48 hour(s))  Basic metabolic  panel     Status: Abnormal   Collection Time: 02/10/19 10:04 PM  Result Value Ref Range   Sodium 136 135 - 145 mmol/L   Potassium 3.8 3.5 - 5.1 mmol/L   Chloride 95 (L) 98 - 111 mmol/L   CO2 33 (H) 22 - 32 mmol/L   Glucose, Bld 125 (H) 70 - 99 mg/dL   BUN 16 8 - 23 mg/dL   Creatinine, Ser 0.60 (L) 0.61 - 1.24 mg/dL   Calcium 8.9 8.9 - 10.3 mg/dL   GFR calc non Af Amer >60 >60 mL/min   GFR calc Af Amer >60 >60 mL/min   Anion gap 8 5 - 15    Comment: Performed at [facility], [address] [place], [place] [address]  CBC     Status: Abnormal   Collection Time: 02/10/19 10:04 PM  Result Value Ref Range   WBC 10.9 (H) 4.0  - 10.5 K/uL   RBC 4.07 (L) 4.22 - 5.81 MIL/uL   Hemoglobin 11.9 (L) 13.0 - 17.0 g/dL   HCT 38.1 (L) 39.0 - 52.0 %   MCV 93.6 80.0 - 100.0 fL   MCH 29.2 26.0 - 34.0 pg   MCHC 31.2 30.0 - 36.0 g/dL   RDW 12.6 11.5 - 15.5 %   Platelets 253 150 - 400 K/uL   nRBC 0.0 0.0 - 0.2 %    Comment: Performed at [facility], [address] [place], [place] [address]  Troponin I - ONCE - STAT     Status: None   Collection Time: 02/10/19 10:04 PM  Result Value Ref Range   Troponin I <0.03 <0.03 ng/mL    Comment: Performed at [facility], [address] [place], [place] [address]  Procalcitonin     Status: None   Collection Time: 02/10/19 10:04 PM  Result Value Ref Range   Procalcitonin <0.10 ng/mL    Comment:        Interpretation: PCT (Procalcitonin) <= 0.5 ng/mL: Systemic infection (sepsis) is not likely. Local bacterial infection is possible. (NOTE)       Sepsis PCT Algorithm           Lower Respiratory Tract                                      Infection PCT Algorithm    ----------------------------     ----------------------------         PCT < 0.25 ng/mL                PCT < 0.10 ng/mL         Strongly encourage             Strongly discourage   discontinuation of antibiotics    initiation of antibiotics    ----------------------------     -----------------------------       PCT 0.25 - 0.50 ng/mL            PCT 0.10 - 0.25 ng/mL               OR       >80% decrease in PCT            Discourage initiation of  antibiotics      Encourage discontinuation           of antibiotics    ----------------------------     -----------------------------         PCT >= 0.50 ng/mL              PCT 0.26 - 0.50 ng/mL               AND        <80% decrease in PCT             Encourage initiation of                                             antibiotics       Encourage continuation           of antibiotics     ----------------------------     -----------------------------        PCT >= 0.50 ng/mL                  PCT > 0.50 ng/mL               AND         increase in PCT                  Strongly encourage                                      initiation of antibiotics    Strongly encourage escalation           of antibiotics                                     -----------------------------                                           PCT <= 0.25 ng/mL                                                 OR                                        > 80% decrease in PCT                                     Discontinue / Do not initiate                                             antibiotics Performed at [facility], [address] [place], [place] [address]   Protime-INR     Status: None   Collection Time: 02/10/19 10:04 PM  Result Value Ref Range   Prothrombin Time 12.6 11.4 - 15.2 seconds   INR 0.95     Comment: Performed at [facility], [address] [place], [place] [address]  Lactic acid, plasma     Status: None   Collection Time: 02/11/19  1:42 AM  Result Value Ref Range   Lactic Acid, Venous 1.1 0.5 - 1.9 mmol/L    Comment: Performed at [facility], [address] [place], [place] [address]   Ct Chest Wo Contrast  Result Date: 02/11/2019 CLINICAL DATA:  COPD exacerbation, short of breath EXAM: CT CHEST WITHOUT CONTRAST TECHNIQUE: Multidetector CT imaging of the chest was performed following the standard protocol without IV contrast. COMPARISON:  Chest x-ray 02/10/2019, CT chest 10/19/2018 09/13/2018, 02/01/2018, 12/07/2017, 10/26/2017, PET-CT 10/13/2017 FINDINGS: Cardiovascular: Limited without intravenous contrast. Nonaneurysmal aorta. Moderate aortic atherosclerosis. Coronary vascular calcification. Normal heart size. No pericardial effusion Mediastinum/Nodes: Midline trachea. No thyroid mass. No significant adenopathy. Esophagus within normal limits.  Lungs/Pleura: Moderate severe emphysema. Stable 10 x 15 mm left upper lobe pulmonary nodule, series 3, image number 44. Stable 5 mm left apical lung nodule, series 3, image number 20. Stable 4 mm left lower lobe pulmonary nodule, series 3, image number 69. Small foci of ground-glass density within the anterior and posterior right upper lobe. Interval development of spiculated mass within the right upper lobe, measuring 1.8 x 2.8 cm. This is contiguous with the pleural surface and appears to contain a small amount of central cavitation. Upper Abdomen: Stable 2.7 x 3 cm left adrenal adenoma. Stable 15 mm right adrenal adenoma. No acute abnormality. Musculoskeletal: No acute or suspicious osseous abnormality. IMPRESSION: 1. Progressed masslike consolidation within the right upper lobe in the region of previous waxing and waning nodule, this is contiguous with the pleural surface and demonstrates spiculated margin and probable small focus of central cavitation. Findings could be secondary to infection superimposed on chronic nodule however cavitary neoplasm is also a concern. Pulmonary consultation is suggested. 2. There are multiple additional lung nodules which have not significantly changed. 3. Small foci of ground-glass density in the right upper lobe may reflect small foci of infection. 4. Moderate severe emphysema 5. Stable bilateral adrenal adenomas Aortic Atherosclerosis (ICD10-I70.0) and Emphysema (ICD10-J43.9). Electronically Signed   By: Donavan Foil M.D.   On: 02/11/2019 00:29   Dg Chest Portable 1 View  Result Date: 02/10/2019 CLINICAL DATA:  Shortness of Breath EXAM: PORTABLE CHEST 1 VIEW COMPARISON:  01/28/2019 FINDINGS: Previously seen right upper lobe consolidation has improved slightly since prior study. Left lung clear. Heart is normal size. No effusions or acute bony abnormality. IMPRESSION: Right upper lobe infiltrate again noted, slightly improved since prior study. Electronically Signed   By:  Rolm Baptise M.D.   On: 02/10/2019 22:24    Pending Labs Unresulted Labs (From admission, onward)    Start     Ordered   02/11/19 0340  MRSA PCR Screening  Once,   STAT     02/11/19 0339   02/11/19 0133  Lactic acid, plasma  STAT Now then every 3 hours,   STAT     02/11/19 0133   02/11/19 0133  Blood Culture (routine x 2)  BLOOD CULTURE X 2,   STAT     02/11/19 0133   Signed and Held  Creatinine, serum  (enoxaparin (LOVENOX)    CrCl >/= 30 ml/min)  Weekly,   R    Comments:  while on enoxaparin therapy  Signed and Held   Signed and Held  TSH  Add-on,   R     Signed and Held          Vitals/Pain Today's Vitals   02/10/19 2330 02/11/19 0223 02/11/19 0230 02/11/19 0316  BP: 114/68 111/68 103/71   Pulse: 70 76 77   Resp:  20 15   Temp:      TempSrc:      SpO2: 97% 97% 97%   PainSc:  0-No pain  Asleep    Isolation Precautions No active isolations  Medications Medications  vancomycin (VANCOCIN) IVPB 1000 mg/200 mL premix (1,000 mg Intravenous New Bag/Given 02/11/19 0251)  vancomycin (VANCOCIN) IVPB 750 mg/150 ml premix (has no administration in time range)  ceFEPIme (MAXIPIME) 2 g in sodium chloride 0.9 % 100 mL IVPB (has no administration in time range)  sodium chloride flush (NS) 0.9 % injection 3 mL (3 mLs Intravenous Given 02/10/19 2210)  ipratropium-albuterol (DUONEB) 0.5-2.5 (3) MG/3ML nebulizer solution 3 mL (3 mLs Nebulization Given 02/10/19 2331)  sodium chloride 0.9 % bolus 500 mL (0 mLs Intravenous Stopped 02/11/19 0316)  ceFEPIme (MAXIPIME) 2 g in sodium chloride 0.9 % 100 mL IVPB (0 g Intravenous Stopped 02/11/19 0239)    Mobility walks

## 2019-02-11 NOTE — Plan of Care (Signed)
  Problem: Education: Goal: Knowledge of General Education information will improve Description Including pain rating scale, medication(s)/side effects and non-pharmacologic comfort measures Outcome: Progressing   Problem: Health Behavior/Discharge Planning: Goal: Ability to manage health-related needs will improve Outcome: Progressing   

## 2019-02-11 NOTE — H&P (Signed)
Leslie Sims is an 68 y.o. male.   Chief Complaint: Shortness of breath HPI: The patient with past medical history of COPD, CAD, hypertension and pulmonary nodules presents to the emergency department with shortness of breath.  The patient was admitted to the hospital 2 weeks ago with COPD exacerbation and eventually treated for community-acquired pneumonia.  The patient completed a course of antibiotics and felt better briefly but has become more weak and short of breath over the last day or so.  The patient required 3 breathing treatments and although his chest x-ray appeared to be marginally better than his previous hospital admission his work of breathing prompted CT exam of the lungs which showed a possible mass with spiculated margins that could represent infection or malignancy which prompted the emergency department staff to call the hospitalist service for admission.  Past Medical History:  Diagnosis Date  . Allergic rhinitis   . Anxiety   . Back pain   . Colon polyps 08/2012   colonoscopy; multiple colon polyps; repeat colonoscopy in one year.  [name] COPD (chronic obstructive pulmonary disease) ([place])   . Coronary artery disease 11/2009   a. late presenting ant MI; b. LHC 100% pLAD s/p PCI/DES, 99% mRCA s/p PCI/DES, EF 35%; c. nuclear stress test 05/13: prior ant/inf infarcts w/o ischemia, EF 43%; d. LHC 02/14: widely patent stents with no other obs dz, EF 40%; e. LHC 11/17: LM nl, mLAD 10%, patent LAD stent, dLAD 20%, p-mRCA 10%, mRCA 40%, patent RCA stent, EF 35-45%   . Depression   . GERD (gastroesophageal reflux disease)   . Headache(784.0)   . Helicobacter pylori (H. pylori)   . Hemorrhoids   . Hernia    inguinal  . Hyperlipidemia   . Hypertension   . Insomnia   . Ischemic cardiomyopathy   . Myalgia   . Peptic ulcer   . Pulmonary nodules   . ST elevation (STEMI) myocardial infarction involving left anterior descending coronary artery ([place]) 11/2009   a. s/p PCI to the LAD   . Status post dilation of esophageal narrowing 2000  . Tinea pedis   . Wears dentures    partial upper    Past Surgical History:  Procedure Laterality Date  . Admission  12/20/2012   COPD exacerbation.  [place].  [name] CARDIAC CATHETERIZATION    . CARDIAC CATHETERIZATION  02/05/13   [facility]  . CARDIAC CATHETERIZATION  10/14   [facility] : patent stents with no change in anatomy. EF: 40$  . CARDIAC CATHETERIZATION Left 11/12/2016   Procedure: Left Heart Cath and Coronary Angiography;  Surgeon: Wellington Hampshire, MD;  Location: [facility];  Service: Cardiovascular;  Laterality: Left;  . COLONOSCOPY    . COLONOSCOPY WITH PROPOFOL N/A 05/18/2016   Procedure: COLONOSCOPY WITH PROPOFOL;  Surgeon: Lucilla Lame, MD;  Location: [facility] ENDOSCOPY;  Service: Endoscopy;  Laterality: N/A;  . CORONARY ANGIOPLASTY WITH STENT PLACEMENT  09/2009   LAD 3.0 X23 mm Xience DES, RCA: 4.0 X 15 mm Xience DES  . ELECTROMAGNETIC NAVIGATION BROCHOSCOPY N/A 10/27/2017   Procedure: ELECTROMAGNETIC NAVIGATION BRONCHOSCOPY;  Surgeon: Flora Lipps, MD;  Location: [facility] ORS;  Service: Cardiopulmonary;  Laterality: N/A;  . ESOPHAGEAL DILATION    . ESOPHAGOGASTRODUODENOSCOPY  2008  . ESOPHAGOGASTRODUODENOSCOPY  08/20/2012  . HERNIA REPAIR  07/20/2012   L inguinal hernia repair  . PENILE PROSTHESIS IMPLANT      Family History  Problem Relation Age of Onset  . Heart attack Brother  Brother #1  . Diabetes Brother   . Hypertension Brother        #3  . Coronary artery disease Father 71       deceased  . Heart attack Father   . Diabetes Father   . Heart disease Father   . COPD Mother 30       deceased  . Alcohol abuse Sister        polysubstance abuse  . COPD Sister   . Lung cancer Sister   . Alcohol abuse Sister        polysubstance abuse  . Penile cancer Brother   . Diabetes Brother    Social History:  reports that he has been smoking cigarettes. He has a 42.00 pack-year smoking history. He has never used  smokeless tobacco. He reports that he does not drink alcohol or use drugs.  Allergies:  Allergies  Allergen Reactions  . Prozac [Fluoxetine Hcl] Shortness Of Breath  . Effexor Xr [Venlafaxine Hcl Er] Other (See Comments)    "Makes me feel funny"  . Wellbutrin [Bupropion] Other (See Comments)    "Makes me feel funny"    Medications Prior to Admission  Medication Sig Dispense Refill  . acetaminophen (TYLENOL) 325 MG tablet Take 2 tablets (650 mg total) by mouth every 6 (six) hours as needed for mild pain (or Fever >/= 101).    [name] albuterol (PROVENTIL HFA;VENTOLIN HFA) 108 (90 Base) MCG/ACT inhaler Inhale 2 puffs into the lungs every 6 (six) hours as needed for wheezing or shortness of breath. 1 Inhaler 2  . ARIPiprazole (ABILIFY) 5 MG tablet Take 5 mg by mouth daily.  0  . aspirin 81 MG tablet Take 81 mg by mouth daily.      [name] atorvastatin (LIPITOR) 40 MG tablet Take 1 tablet (40 mg total) by mouth daily. 90 tablet 3  . Fluticasone-Salmeterol (ADVAIR) 500-50 MCG/DOSE AEPB Inhale 1 puff into the lungs every 12 (twelve) hours.    . furosemide (LASIX) 20 MG tablet Take 1 tablet (20 mg total) by mouth daily. 90 tablet 3  . guaiFENesin-dextromethorphan (ROBITUSSIN DM) 100-10 MG/5ML syrup Take 5 mLs by mouth every 4 (four) hours as needed for cough. 118 mL 0  . hydrOXYzine (VISTARIL) 25 MG capsule Take 25 mg by mouth 2 (two) times daily as needed for anxiety or itching.   0  . ipratropium-albuterol (DUONEB) 0.5-2.5 (3) MG/3ML SOLN Take 3 mLs by nebulization 4 (four) times daily. DX:J44.9 1620 mL 3  . LORazepam (ATIVAN) 1 MG tablet Take 1 tablet (1 mg total) by mouth every 8 (eight) hours as needed for anxiety. 90 tablet 5  . mirtazapine (REMERON) 30 MG tablet Take 1 tablet (30 mg total) by mouth at bedtime. 30 tablet 5  . Multiple Vitamin (MULTIVITAMIN) capsule Take 1 capsule by mouth daily.    . nitroGLYCERIN (NITROSTAT) 0.4 MG SL tablet Place 1 tablet (0.4 mg total) under the tongue every 5  (five) minutes as needed. 25 tablet 3  . OLANZapine (ZYPREXA) 2.5 MG tablet Take 2.5 mg by mouth at bedtime.    . sertraline (ZOLOFT) 100 MG tablet Take 200 mg by mouth daily.   2  . sucralfate (CARAFATE) 1 GM/10ML suspension Take 10 mLs (1 g total) by mouth 4 (four) times daily -  with meals and at bedtime. (Patient taking differently: Take 1 g by mouth 4 (four) times daily as needed. ) 3600 mL 3  . budesonide (PULMICORT) 0.25 MG/2ML nebulizer solution Inhale 0.25  mg into the lungs daily.    . feeding supplement, ENSURE ENLIVE, (ENSURE ENLIVE) LIQD Take 237 mLs by mouth 3 (three) times daily between meals. 237 mL 12    Results for orders placed or performed during the hospital encounter of 02/10/19 (from the past 48 hour(s))  Basic metabolic panel     Status: Abnormal   Collection Time: 02/10/19 10:04 PM  Result Value Ref Range   Sodium 136 135 - 145 mmol/L   Potassium 3.8 3.5 - 5.1 mmol/L   Chloride 95 (L) 98 - 111 mmol/L   CO2 33 (H) 22 - 32 mmol/L   Glucose, Bld 125 (H) 70 - 99 mg/dL   BUN 16 8 - 23 mg/dL   Creatinine, Ser 0.60 (L) 0.61 - 1.24 mg/dL   Calcium 8.9 8.9 - 10.3 mg/dL   GFR calc non Af Amer >60 >60 mL/min   GFR calc Af Amer >60 >60 mL/min   Anion gap 8 5 - 15    Comment: Performed at [facility], [address] [place], [place] [address]  CBC     Status: Abnormal   Collection Time: 02/10/19 10:04 PM  Result Value Ref Range   WBC 10.9 (H) 4.0 - 10.5 K/uL   RBC 4.07 (L) 4.22 - 5.81 MIL/uL   Hemoglobin 11.9 (L) 13.0 - 17.0 g/dL   HCT 38.1 (L) 39.0 - 52.0 %   MCV 93.6 80.0 - 100.0 fL   MCH 29.2 26.0 - 34.0 pg   MCHC 31.2 30.0 - 36.0 g/dL   RDW 12.6 11.5 - 15.5 %   Platelets 253 150 - 400 K/uL   nRBC 0.0 0.0 - 0.2 %    Comment: Performed at [facility], [address] [place], [place] [address]  Troponin I - ONCE - STAT     Status: None   Collection Time: 02/10/19 10:04 PM  Result Value Ref Range   Troponin I <0.03 <0.03 ng/mL    Comment:  Performed at [facility], [address] [place], [place] [address]  Procalcitonin     Status: None   Collection Time: 02/10/19 10:04 PM  Result Value Ref Range   Procalcitonin <0.10 ng/mL    Comment:        Interpretation: PCT (Procalcitonin) <= 0.5 ng/mL: Systemic infection (sepsis) is not likely. Local bacterial infection is possible. (NOTE)       Sepsis PCT Algorithm           Lower Respiratory Tract                                      Infection PCT Algorithm    ----------------------------     ----------------------------         PCT < 0.25 ng/mL                PCT < 0.10 ng/mL         Strongly encourage             Strongly discourage   discontinuation of antibiotics    initiation of antibiotics    ----------------------------     -----------------------------       PCT 0.25 - 0.50 ng/mL            PCT 0.10 - 0.25 ng/mL               OR       >  80% decrease in PCT            Discourage initiation of                                            antibiotics      Encourage discontinuation           of antibiotics    ----------------------------     -----------------------------         PCT >= 0.50 ng/mL              PCT 0.26 - 0.50 ng/mL               AND        <80% decrease in PCT             Encourage initiation of                                             antibiotics       Encourage continuation           of antibiotics    ----------------------------     -----------------------------        PCT >= 0.50 ng/mL                  PCT > 0.50 ng/mL               AND         increase in PCT                  Strongly encourage                                      initiation of antibiotics    Strongly encourage escalation           of antibiotics                                     -----------------------------                                           PCT <= 0.25 ng/mL                                                 OR                                        > 80%  decrease in PCT                                     Discontinue / Do not initiate  antibiotics Performed at [facility], [address] [place], [place] [address]   Protime-INR     Status: None   Collection Time: 02/10/19 10:04 PM  Result Value Ref Range   Prothrombin Time 12.6 11.4 - 15.2 seconds   INR 0.95     Comment: Performed at [facility], [address] [place], [place] [address]  Lactic acid, plasma     Status: None   Collection Time: 02/11/19  1:42 AM  Result Value Ref Range   Lactic Acid, Venous 1.1 0.5 - 1.9 mmol/L    Comment: Performed at [facility], [address] [place],  [address]   Ct Chest Wo Contrast  Result Date: 02/11/2019 CLINICAL DATA:  COPD exacerbation, short of breath EXAM: CT CHEST WITHOUT CONTRAST TECHNIQUE: Multidetector CT imaging of the chest was performed following the standard protocol without IV contrast. COMPARISON:  Chest x-ray 02/10/2019, CT chest 10/19/2018 09/13/2018, 02/01/2018, 12/07/2017, 10/26/2017, PET-CT 10/13/2017 FINDINGS: Cardiovascular: Limited without intravenous contrast. Nonaneurysmal aorta. Moderate aortic atherosclerosis. Coronary vascular calcification. Normal heart size. No pericardial effusion Mediastinum/Nodes: Midline trachea. No thyroid mass. No significant adenopathy. Esophagus within normal limits. Lungs/Pleura: Moderate severe emphysema. Stable 10 x 15 mm left upper lobe pulmonary nodule, series 3, image number 44. Stable 5 mm left apical lung nodule, series 3, image number 20. Stable 4 mm left lower lobe pulmonary nodule, series 3, image number 69. Small foci of ground-glass density within the anterior and posterior right upper lobe. Interval development of spiculated mass within the right upper lobe, measuring 1.8 x 2.8 cm. This is contiguous with the pleural surface and appears to contain a small amount of central cavitation. Upper Abdomen:  Stable 2.7 x 3 cm left adrenal adenoma. Stable 15 mm right adrenal adenoma. No acute abnormality. Musculoskeletal: No acute or suspicious osseous abnormality. IMPRESSION: 1. Progressed masslike consolidation within the right upper lobe in the region of previous waxing and waning nodule, this is contiguous with the pleural surface and demonstrates spiculated margin and probable small focus of central cavitation. Findings could be secondary to infection superimposed on chronic nodule however cavitary neoplasm is also a concern. Pulmonary consultation is suggested. 2. There are multiple additional lung nodules which have not significantly changed. 3. Small foci of ground-glass density in the right upper lobe may reflect small foci of infection. 4. Moderate severe emphysema 5. Stable bilateral adrenal adenomas Aortic Atherosclerosis (ICD10-I70.0) and Emphysema (ICD10-J43.9). Electronically Signed   By: Donavan Foil M.D.   On: 02/11/2019 00:29   Dg Chest Portable 1 View  Result Date: 02/10/2019 CLINICAL DATA:  Shortness of Breath EXAM: PORTABLE CHEST 1 VIEW COMPARISON:  01/28/2019 FINDINGS: Previously seen right upper lobe consolidation has improved slightly since prior study. Left lung clear. Heart is normal size. No effusions or acute bony abnormality. IMPRESSION: Right upper lobe infiltrate again noted, slightly improved since prior study. Electronically Signed   By: Rolm Baptise M.D.   On: 02/10/2019 22:24    Review of Systems  Constitutional: Negative for chills and fever.  HENT: Negative for sore throat and tinnitus.   Eyes: Negative for blurred vision and redness.  Respiratory: Positive for cough and shortness of breath.   Cardiovascular: Negative for chest pain, palpitations, orthopnea and PND.  Gastrointestinal: Negative for abdominal pain, diarrhea, nausea and vomiting.  Genitourinary: Negative for dysuria, frequency and urgency.  Musculoskeletal: Negative for joint pain and myalgias.  Skin:  Negative for rash.       No lesions  Neurological:  Negative for speech change, focal weakness and weakness.  Endo/Heme/Allergies: Does not bruise/bleed easily.       No temperature intolerance  Psychiatric/Behavioral: Negative for depression and suicidal ideas.    Blood pressure 103/71, pulse 77, temperature 98.3 F (36.8 C), temperature source Oral, resp. rate 15, SpO2 97 %. Physical Exam  Vitals reviewed. Constitutional: He is oriented to person, place, and time. He appears well-developed and well-nourished. No distress. Nasal cannula in place.  HENT:  Head: Normocephalic and atraumatic.  Mouth/Throat: Oropharynx is clear and moist.  Eyes: Pupils are equal, round, and reactive to light. Conjunctivae and EOM are normal. No scleral icterus.  Neck: Normal range of motion. Neck supple. No JVD present. No tracheal deviation present. No thyromegaly present.  Cardiovascular: Normal rate, regular rhythm and normal heart sounds. Exam reveals no gallop and no friction rub.  No murmur heard. Respiratory: Effort normal and breath sounds normal. No respiratory distress.  GI: Soft. Bowel sounds are normal. He exhibits no distension. There is no abdominal tenderness.  Genitourinary:    Genitourinary Comments: Deferred   Musculoskeletal: Normal range of motion.        General: No edema.  Lymphadenopathy:    He has no cervical adenopathy.  Neurological: He is alert and oriented to person, place, and time. No cranial nerve deficit.  Skin: Skin is warm and dry. No rash noted. No erythema.  Psychiatric: He has a normal mood and affect. His behavior is normal. Judgment and thought content normal.     Assessment/Plan This is a 68 year old male admitted for pneumonia. 1.  Pneumonia: Healthcare associated; continue cefepime and vancomycin.  Possible that infiltrate represents obstructive pneumonia concerning for malignancy. 2.  Lung mass: Cavitary center with spiculated margins.  May represent  infectious origin superimposed upon chronic nodules or possible malignancy.  Consult pulmonology for possible bronchoscopy 3.  CAD: Stable; continue aspirin. 4.  Hypertension: Controlled; continue to monitor 5.  COPD: Stable; currently on 3 L of oxygen via nasal cannula per home regimen.  Maintain oxygen saturations 88 to 92%.  Continue inhaled corticosteroid with long-acting bronchial agonist.  Albuterol as needed. 6.  DVT prophylaxis: Lovenox 7.  GI prophylaxis: None The patient is a full code.  Time spent on admission orders and patient care approximately 45 minutes  Harrie Foreman, MD 02/11/2019, 3:32 AM

## 2019-02-11 NOTE — ED Notes (Signed)
bld cxs x2 and lactates drawn and sent to lab.

## 2019-02-11 NOTE — Progress Notes (Addendum)
Patient 68 year old recently hospitalized for pneumonia readmitted for same noted to have a possible mass  1.  Pneumonia: Healthcare associated; continue cefepime and vancomycin.  Possible that infiltrate represents obstructive pneumonia concerning for malignancy. 2.  Lung mass: Cavitary center with spiculated margins.    I discussed the case with patient's primary oncologist Dr. Jamal Collin who will see the patient tomorrow he states that patient has this type of inflammatory changes noted previously and feels this is due to severe emphysema he is a poor candidate for intermittent dimension due to his lung status 3.  CAD: Stable; continue aspirin. 4.  Hypertension: Controlled; continue to monitor 5.  COPD: With acute exacerbation we will treat with nebulizer therapy steroids 6.  Nicotine abuse smoking cessation provided 4 minutes spent strongly recommend he stop smoking continue patch will be started

## 2019-02-11 NOTE — Progress Notes (Signed)
Pharmacy Antibiotic Note  Leslie Sims is a 68 y.o. male admitted on 02/10/2019 with pneumonia.  Pharmacy has been consulted for vancomycin and cefepime dosing.  Plan: DW 64 kg   Vd 41L kei 0.078 hr-1  T1/2 9 hours  Vancomycin 750 mg IV Q 12 hrs. Goal AUC 400-550. Expected AUC: 468 SCr used: 0.8  Temp (24hrs), Avg:98.3 F (36.8 C), Min:98.3 F (36.8 C), Max:98.3 F (36.8 C)  Recent Labs  Lab 02/10/19 2204 02/11/19 0142  WBC 10.9*  --   CREATININE 0.60*  --   LATICACIDVEN  --  1.1    Estimated Creatinine Clearance: 82.3 mL/min (A) (by C-G formula based on SCr of 0.6 mg/dL (L)).    Allergies  Allergen Reactions  . Prozac [Fluoxetine Hcl] Shortness Of Breath  . Effexor Xr [Venlafaxine Hcl Er] Other (See Comments)    "Makes me feel funny"  . Wellbutrin [Bupropion] Other (See Comments)    "Makes me feel funny"    Antimicrobials this admission: Vancomycin, cefepime 2/23  >>    >>   Dose adjustments this admission:   Microbiology results: 2/23 BCx: pending 2/23 MRSA PCR: pending  Thank you for allowing pharmacy to be a part of this patient's care.  Yossi Hinchman S 02/11/2019 3:36 AM

## 2019-02-11 NOTE — ED Notes (Signed)
Report from jeanette, rn.  

## 2019-02-11 NOTE — ED Notes (Signed)
Returned from Ct

## 2019-02-12 ENCOUNTER — Other Ambulatory Visit: Payer: Self-pay

## 2019-02-12 DIAGNOSIS — J449 Chronic obstructive pulmonary disease, unspecified: Secondary | ICD-10-CM

## 2019-02-12 DIAGNOSIS — R911 Solitary pulmonary nodule: Secondary | ICD-10-CM

## 2019-02-12 DIAGNOSIS — J189 Pneumonia, unspecified organism: Principal | ICD-10-CM

## 2019-02-12 LAB — CBC
HCT: 32.7 % — ABNORMAL LOW (ref 39.0–52.0)
Hemoglobin: 10.2 g/dL — ABNORMAL LOW (ref 13.0–17.0)
MCH: 29.7 pg (ref 26.0–34.0)
MCHC: 31.2 g/dL (ref 30.0–36.0)
MCV: 95.3 fL (ref 80.0–100.0)
Platelets: 203 10*3/uL (ref 150–400)
RBC: 3.43 MIL/uL — ABNORMAL LOW (ref 4.22–5.81)
RDW: 12.8 % (ref 11.5–15.5)
WBC: 9.3 10*3/uL (ref 4.0–10.5)
nRBC: 0 % (ref 0.0–0.2)

## 2019-02-12 LAB — BASIC METABOLIC PANEL
Anion gap: 4 — ABNORMAL LOW (ref 5–15)
BUN: 17 mg/dL (ref 8–23)
CHLORIDE: 105 mmol/L (ref 98–111)
CO2: 32 mmol/L (ref 22–32)
Calcium: 8.7 mg/dL — ABNORMAL LOW (ref 8.9–10.3)
Creatinine, Ser: 0.67 mg/dL (ref 0.61–1.24)
GFR calc Af Amer: >60 mL/min (ref >60)
GFR calc non Af Amer: >60 mL/min (ref >60)
Glucose, Bld: 95 mg/dL (ref 70–99)
Potassium: 4 mmol/L (ref 3.5–5.1)
SODIUM: 141 mmol/L (ref 135–145)

## 2019-02-12 NOTE — Consult Note (Signed)
[facility] Consultation      Assessment and Plan:  Acute exacerbation of COPD with acute hypoxic respiratory failure. Pneumonia. - Agree with continued empiric treatment for pneumonia, can likely de-escalate antibiotics. --Advance activity as tolerated.    Cavitary lung nodule. Severe/end-stage emphysema with frequent admissions, severe debilitating dyspnea. - Patient has a history of slowly growing right upper lobe lung nodule, status post ENB biopsy in the past which was negative.  The nodule has undergone recent cavitation. - Patient is being followed by oncology.  Given his severe emphysema, further biopsy would be high risk.  Further follow-up can be continued outpatient as deemed necessary.  --Palliative care consultation may be helpful.     Date: 02/12/2019  MRN# [id number] Leslie Sims [facility] [birth year]  Referring Physician: Dr. Serita Grit for emphysema, lung nodule.   Leslie Sims is a 68 y.o. old male seen in consultation for chief complaint of:    Chief Complaint  Patient presents with  . Shortness of Breath    HPI:  Patient is a 68 year old male with a history of very severe emphysema and significant dyspnea on exertion with poor functional status.  He is managed on Advair, DuoNeb, oxygen at 2 to 3 L, azithromycin 2050 mg every other day.  Patient is followed in the office by Dr. Jamal Collin, he is also been noted to have multiple hospitalizations over the past few months for COPD exacerbation.  He has a history of upper lobe lung nodules status post ENB back to 10/27/2017 Which Were Negative for Malignancy.Given the progressive decline Dr. Jamal Collin was considering addressing  goals of care. Patient presented to the hospital on 02/11/2019 with dyspnea, patient required 3 breathing treatments and subsequently admitted for healthcare associated pneumonia. Currently the patient is on Pulmicort nebulizer, cefepime, Dulera, nicotine patch. He feels that his  breathing has somewhat improved since admission, though he is still short.  He has no other particular complaints at this time.  MRSA PCR is negative.  Blood culture negative thus far.  **CT chest 02/10/2019>> imaging personally reviewed, severe emphysematous changes throughout both lungs.  There is a right upper lobe and left upper lobe nodule seen.  Nodule appears to be larger than previous, with area of central cavitation.     PMHX:   Past Medical History:  Diagnosis Date  . Allergic rhinitis   . Anxiety   . Back pain   . Colon polyps 08/2012   colonoscopy; multiple colon polyps; repeat colonoscopy in one year.  [name] COPD (chronic obstructive pulmonary disease) ([place])   . Coronary artery disease 11/2009   a. late presenting ant MI; b. LHC 100% pLAD s/p PCI/DES, 99% mRCA s/p PCI/DES, EF 35%; c. nuclear stress test 05/13: prior ant/inf infarcts w/o ischemia, EF 43%; d. LHC 02/14: widely patent stents with no other obs dz, EF 40%; e. LHC 11/17: LM nl, mLAD 10%, patent LAD stent, dLAD 20%, p-mRCA 10%, mRCA 40%, patent RCA stent, EF 35-45%   . Depression   . GERD (gastroesophageal reflux disease)   . Headache(784.0)   . Helicobacter pylori (H. pylori)   . Hemorrhoids   . Hernia    inguinal  . Hyperlipidemia   . Hypertension   . Insomnia   . Ischemic cardiomyopathy   . Myalgia   . Peptic ulcer   . Pulmonary nodules   . ST elevation (STEMI) myocardial infarction involving left anterior descending coronary artery ([place]) 11/2009   a. s/p PCI to the LAD  .  Status post dilation of esophageal narrowing 2000  . Tinea pedis   . Wears dentures    partial upper   Surgical Hx:  Past Surgical History:  Procedure Laterality Date  . Admission  12/20/2012   COPD exacerbation.  [place].  [name] CARDIAC CATHETERIZATION    . CARDIAC CATHETERIZATION  02/05/13   [facility]  . CARDIAC CATHETERIZATION  10/14   [facility] : patent stents with no change in anatomy. EF: 40$  . CARDIAC CATHETERIZATION Left 11/12/2016    Procedure: Left Heart Cath and Coronary Angiography;  Surgeon: Wellington Hampshire, MD;  Location: [facility];  Service: Cardiovascular;  Laterality: Left;  . COLONOSCOPY    . COLONOSCOPY WITH PROPOFOL N/A 05/18/2016   Procedure: COLONOSCOPY WITH PROPOFOL;  Surgeon: Lucilla Lame, MD;  Location: [facility] ENDOSCOPY;  Service: Endoscopy;  Laterality: N/A;  . CORONARY ANGIOPLASTY WITH STENT PLACEMENT  09/2009   LAD 3.0 X23 mm Xience DES, RCA: 4.0 X 15 mm Xience DES  . ELECTROMAGNETIC NAVIGATION BROCHOSCOPY N/A 10/27/2017   Procedure: ELECTROMAGNETIC NAVIGATION BRONCHOSCOPY;  Surgeon: Flora Lipps, MD;  Location: [facility] ORS;  Service: Cardiopulmonary;  Laterality: N/A;  . ESOPHAGEAL DILATION    . ESOPHAGOGASTRODUODENOSCOPY  2008  . ESOPHAGOGASTRODUODENOSCOPY  08/20/2012  . HERNIA REPAIR  07/20/2012   L inguinal hernia repair  . PENILE PROSTHESIS IMPLANT     Family Hx:  Family History  Problem Relation Age of Onset  . Heart attack Brother        Brother #1  . Diabetes Brother   . Hypertension Brother        #3  . Coronary artery disease Father 67       deceased  . Heart attack Father   . Diabetes Father   . Heart disease Father   . COPD Mother 1       deceased  . Alcohol abuse Sister        polysubstance abuse  . COPD Sister   . Lung cancer Sister   . Alcohol abuse Sister        polysubstance abuse  . Penile cancer Brother   . Diabetes Brother    Social Hx:   Social History   Tobacco Use  . Smoking status: Current Every Day Smoker    Packs/day: 1.00    Years: 42.00    Pack years: 42.00    Types: Cigarettes  . Smokeless tobacco: Never Used  Substance Use Topics  . Alcohol use: No    Alcohol/week: 0.0 standard drinks  . Drug use: No   Medication:    Current Facility-Administered Medications:  .  0.9 %  sodium chloride infusion, , Intravenous, Continuous, Harrie Foreman, MD, Last Rate: 100 mL/hr at 02/12/19 4332 .  acetaminophen (TYLENOL) tablet 650 mg, 650 mg, Oral,  Q6H PRN **OR** acetaminophen (TYLENOL) suppository 650 mg, 650 mg, Rectal, Q6H PRN, Harrie Foreman, MD .  ARIPiprazole (ABILIFY) tablet 5 mg, 5 mg, Oral, Daily, Harrie Foreman, MD, 5 mg at 02/11/19 9518 .  aspirin chewable tablet 81 mg, 81 mg, Oral, Daily, Harrie Foreman, MD, 81 mg at 02/11/19 0951 .  atorvastatin (LIPITOR) tablet 40 mg, 40 mg, Oral, Daily, Harrie Foreman, MD, 40 mg at 02/11/19 0951 .  budesonide (PULMICORT) nebulizer solution 0.25 mg, 0.25 mg, Inhalation, Daily, Harrie Foreman, MD, 0.25 mg at 02/12/19 0748 .  ceFEPIme (MAXIPIME) 2 g in sodium chloride 0.9 % 100 mL IVPB, 2 g, Intravenous, Q8H, Hinda Kehr, MD, Stopped  at 02/12/19 0227 .  docusate sodium (COLACE) capsule 100 mg, 100 mg, Oral, BID, Harrie Foreman, MD, 100 mg at 02/11/19 2237 .  enoxaparin (LOVENOX) injection 40 mg, 40 mg, Subcutaneous, Q24H, Harrie Foreman, MD, 40 mg at 02/11/19 2357 .  feeding supplement (ENSURE ENLIVE) (ENSURE ENLIVE) liquid 237 mL, 237 mL, Oral, TID BM, Harrie Foreman, MD, 237 mL at 02/11/19 2247 .  furosemide (LASIX) tablet 20 mg, 20 mg, Oral, Daily, Harrie Foreman, MD, 20 mg at 02/11/19 0951 .  guaiFENesin-dextromethorphan (ROBITUSSIN DM) 100-10 MG/5ML syrup 5 mL, 5 mL, Oral, Q4H PRN, Harrie Foreman, MD .  hydrOXYzine (ATARAX/VISTARIL) tablet 25 mg, 25 mg, Oral, BID PRN, Harrie Foreman, MD .  ipratropium-albuterol (DUONEB) 0.5-2.5 (3) MG/3ML nebulizer solution 3 mL, 3 mL, Nebulization, QID, Harrie Foreman, MD, 3 mL at 02/12/19 0748 .  LORazepam (ATIVAN) tablet 1 mg, 1 mg, Oral, Q4H PRN, [name], MD, 1 mg at 02/11/19 1744 .  MEDLINE mouth rinse, 15 mL, Mouth Rinse, BID, Harrie Foreman, MD, 15 mL at 02/11/19 2246 .  mirtazapine (REMERON) tablet 30 mg, 30 mg, Oral, QHS, Harrie Foreman, MD, 30 mg at 02/11/19 2237 .  mometasone-formoterol (DULERA) 200-5 MCG/ACT inhaler 2 puff, 2 puff, Inhalation, BID, Harrie Foreman, MD, 2 puff at  02/11/19 2240 .  multivitamin with minerals tablet 1 tablet, 1 tablet, Oral, Daily, Harrie Foreman, MD, 1 tablet at 02/11/19 [phone] .  nicotine (NICODERM CQ - dosed in mg/24 hours) patch 21 mg, 21 mg, Transdermal, Daily, [name], MD, 21 mg at 02/11/19 1504 .  nitroGLYCERIN (NITROSTAT) SL tablet 0.4 mg, 0.4 mg, Sublingual, Q5 min PRN, Harrie Foreman, MD .  OLANZapine [facility]) tablet 2.5 mg, 2.5 mg, Oral, QHS, Harrie Foreman, MD, 2.5 mg at 02/11/19 2238 .  ondansetron (ZOFRAN) tablet 4 mg, 4 mg, Oral, Q6H PRN **OR** ondansetron (ZOFRAN) injection 4 mg, 4 mg, Intravenous, Q6H PRN, Harrie Foreman, MD .  sertraline (ZOLOFT) tablet 200 mg, 200 mg, Oral, Daily, Harrie Foreman, MD, 200 mg at 02/11/19 0951 .  sucralfate (CARAFATE) 1 GM/10ML suspension 1 g, 1 g, Oral, QID PRN, Harrie Foreman, MD .  vancomycin (VANCOCIN) IVPB 750 mg/150 ml premix, 750 mg, Intravenous, Q12H, Hinda Kehr, MD, Stopped at 02/11/19 2337   Allergies:  Prozac [fluoxetine hcl]; Effexor xr [venlafaxine hcl er]; and Wellbutrin [bupropion]  Review of Systems: Gen:  Denies  fever, sweats, chills HEENT: Denies blurred vision, double vision. bleeds, sore throat Cvc:  No dizziness, chest pain. Resp:   Denies cough or sputum production, shortness of breath Gi: Denies swallowing difficulty, stomach pain. Gu:  Denies bladder incontinence, burning urine Ext:   No Joint pain, stiffness. Skin: No skin rash,  hives  Endoc:  No polyuria, polydipsia. Psych: No depression, insomnia. Other:  All other systems were reviewed with the patient and were negative other that what is mentioned in the HPI.   Physical Examination:   VS: BP 114/62 (BP Location: Left Arm)   Pulse 76   Temp 97.8 F (36.6 C) (Oral)   Resp 18   Ht 5\' 9"  (1.753 m)   Wt 58.7 kg   SpO2 96%   BMI 19.11 kg/m   General Appearance: No distress  Neuro:without focal findings,  speech normal,  HEENT: PERRLA, EOM intact.   Pulmonary:  normal breath sounds, No wheezing.  CardiovascularNormal S1,S2.  No m/r/g.   Abdomen: Benign, Soft, non-tender. Renal:  No  costovertebral tenderness  GU:  No performed at this time. Endoc: No evident thyromegaly, no signs of acromegaly. Skin:   warm, no rashes, no ecchymosis  Extremities: normal, no cyanosis, clubbing.  Other findings:    LABORATORY PANEL:   CBC Recent Labs  Lab 02/12/19 0442  WBC 9.3  HGB 10.2*  HCT 32.7*  PLT 203   ------------------------------------------------------------------------------------------------------------------  Chemistries  Recent Labs  Lab 02/12/19 0442  NA 141  K 4.0  CL 105  CO2 32  GLUCOSE 95  BUN 17  CREATININE 0.67  CALCIUM 8.7*   ------------------------------------------------------------------------------------------------------------------  Cardiac Enzymes Recent Labs  Lab 02/10/19 2204  TROPONINI <0.03   ------------------------------------------------------------  RADIOLOGY:  Ct Chest Wo Contrast  Result Date: 02/11/2019 CLINICAL DATA:  COPD exacerbation, short of breath EXAM: CT CHEST WITHOUT CONTRAST TECHNIQUE: Multidetector CT imaging of the chest was performed following the standard protocol without IV contrast. COMPARISON:  Chest x-ray 02/10/2019, CT chest 10/19/2018 09/13/2018, 02/01/2018, 12/07/2017, 10/26/2017, PET-CT 10/13/2017 FINDINGS: Cardiovascular: Limited without intravenous contrast. Nonaneurysmal aorta. Moderate aortic atherosclerosis. Coronary vascular calcification. Normal heart size. No pericardial effusion Mediastinum/Nodes: Midline trachea. No thyroid mass. No significant adenopathy. Esophagus within normal limits. Lungs/Pleura: Moderate severe emphysema. Stable 10 x 15 mm left upper lobe pulmonary nodule, series 3, image number 44. Stable 5 mm left apical lung nodule, series 3, image number 20. Stable 4 mm left lower lobe pulmonary nodule, series 3, image number 69. Small foci of ground-glass  density within the anterior and posterior right upper lobe. Interval development of spiculated mass within the right upper lobe, measuring 1.8 x 2.8 cm. This is contiguous with the pleural surface and appears to contain a small amount of central cavitation. Upper Abdomen: Stable 2.7 x 3 cm left adrenal adenoma. Stable 15 mm right adrenal adenoma. No acute abnormality. Musculoskeletal: No acute or suspicious osseous abnormality. IMPRESSION: 1. Progressed masslike consolidation within the right upper lobe in the region of previous waxing and waning nodule, this is contiguous with the pleural surface and demonstrates spiculated margin and probable small focus of central cavitation. Findings could be secondary to infection superimposed on chronic nodule however cavitary neoplasm is also a concern. Pulmonary consultation is suggested. 2. There are multiple additional lung nodules which have not significantly changed. 3. Small foci of ground-glass density in the right upper lobe may reflect small foci of infection. 4. Moderate severe emphysema 5. Stable bilateral adrenal adenomas Aortic Atherosclerosis (ICD10-I70.0) and Emphysema (ICD10-J43.9). Electronically Signed   By: Donavan Foil M.D.   On: 02/11/2019 00:29   Dg Chest Portable 1 View  Result Date: 02/10/2019 CLINICAL DATA:  Shortness of Breath EXAM: PORTABLE CHEST 1 VIEW COMPARISON:  01/28/2019 FINDINGS: Previously seen right upper lobe consolidation has improved slightly since prior study. Left lung clear. Heart is normal size. No effusions or acute bony abnormality. IMPRESSION: Right upper lobe infiltrate again noted, slightly improved since prior study. Electronically Signed   By: Rolm Baptise M.D.   On: 02/10/2019 22:24       Thank  you for the consultation and for allowing [facility] to assist in the care of your patient. Our recommendations are noted above.  Please contact us if we can be of further service.   Marda Stalker, M.D., F.C.C.P.  Board Certified in Internal Medicine, Pulmonary Medicine, [place], and Sleep Medicine.  [facility] Office Number: [phone]   02/12/2019

## 2019-02-12 NOTE — Progress Notes (Signed)
[place] at [facility]                                                                                                                                                                                  Patient Demographics   Leslie Sims, is a 68 y.o. male, DOB - [birth year], IRC:[id number]  Admit date - 02/10/2019   Admitting Physician Harrie Foreman, MD  Outpatient Primary MD for the patient is [name], MD   LOS - 1  Subjective: Patient continues to be short of breath denies any chest pain    Review of Systems:   CONSTITUTIONAL: No documented fever. No fatigue, weakness. No weight gain, no weight loss.  EYES: No blurry or double vision.  ENT: No tinnitus. No postnasal drip. No redness of the oropharynx.  RESPIRATORY: No cough, no wheeze, no hemoptysis.  Positive dyspnea.  CARDIOVASCULAR: No chest pain. No orthopnea. No palpitations. No syncope.  GASTROINTESTINAL: No nausea, no vomiting or diarrhea. No abdominal pain. No melena or hematochezia.  GENITOURINARY: No dysuria or hematuria.  ENDOCRINE: No polyuria or nocturia. No heat or cold intolerance.  HEMATOLOGY: No anemia. No bruising. No bleeding.  INTEGUMENTARY: No rashes. No lesions.  MUSCULOSKELETAL: No arthritis. No swelling. No gout.  NEUROLOGIC: No numbness, tingling, or ataxia. No seizure-type activity.  PSYCHIATRIC: No anxiety. No insomnia. No ADD.    Vitals:   Vitals:   02/12/19 0413 02/12/19 0634 02/12/19 0750 02/12/19 1210  BP:  114/62  126/67  Pulse:  73 76 84  Resp:  20 18 20   Temp:  97.8 F (36.6 C)  97.6 F (36.4 C)  TempSrc:  Oral  Oral  SpO2:  100% 96% 98%  Weight: 58.7 kg     Height: 5\' 9"  (1.753 m)       Wt Readings from Last 3 Encounters:  02/12/19 58.7 kg  01/28/19 63.2 kg  01/16/19 63.2 kg     Intake/Output Summary (Last 24 hours) at 02/12/2019 1421 Last data filed at 02/12/2019 0900 Gross per 24 hour  Intake 2845.46 ml  Output 2300 ml  Net  545.46 ml    Physical Exam:   GENERAL: Pleasant-appearing in no apparent distress.  HEAD, EYES, EARS, NOSE AND THROAT: Atraumatic, normocephalic. Extraocular muscles are intact. Pupils equal and reactive to light. Sclerae anicteric. No conjunctival injection. No oro-pharyngeal erythema.  NECK: Supple. There is no jugular venous distention. No bruits, no lymphadenopathy, no thyromegaly.  HEART: Regular rate and rhythm,. No murmurs, no rubs, no clicks.  LUNGS: Decreased breath sound ABDOMEN: Soft, flat, nontender, nondistended. Has good bowel sounds. No hepatosplenomegaly appreciated.  EXTREMITIES: No evidence of any cyanosis, clubbing,  or peripheral edema.  +2 pedal and radial pulses bilaterally.  NEUROLOGIC: The patient is alert, awake, and oriented x3 with no focal motor or sensory deficits appreciated bilaterally.  SKIN: Moist and warm with no rashes appreciated.  Psych: Not anxious, depressed LN: No inguinal LN enlargement    Antibiotics   Anti-infectives (From admission, onward)   Start     Dose/Rate Route Frequency Ordered Stop   02/11/19 1000  vancomycin (VANCOCIN) IVPB 750 mg/150 ml premix  Status:  Discontinued     750 mg 150 mL/hr over 60 Minutes Intravenous Every 12 hours 02/11/19 0327 02/12/19 1252   02/11/19 1000  ceFEPIme (MAXIPIME) 2 g in sodium chloride 0.9 % 100 mL IVPB     2 g 200 mL/hr over 30 Minutes Intravenous Every 8 hours 02/11/19 0327     02/11/19 0145  vancomycin (VANCOCIN) IVPB 1000 mg/200 mL premix     1,000 mg 200 mL/hr over 60 Minutes Intravenous  Once 02/11/19 0133 02/11/19 0352   02/11/19 0145  ceFEPIme (MAXIPIME) 2 g in sodium chloride 0.9 % 100 mL IVPB     2 g 200 mL/hr over 30 Minutes Intravenous  Once 02/11/19 0133 02/11/19 0239      Medications   Scheduled Meds: . ARIPiprazole  5 mg Oral Daily  . aspirin  81 mg Oral Daily  . atorvastatin  40 mg Oral Daily  . budesonide  0.25 mg Inhalation Daily  . docusate sodium  100 mg Oral BID  .  enoxaparin (LOVENOX) injection  40 mg Subcutaneous Q24H  . feeding supplement (ENSURE ENLIVE)  237 mL Oral TID BM  . furosemide  20 mg Oral Daily  . ipratropium-albuterol  3 mL Nebulization QID  . mouth rinse  15 mL Mouth Rinse BID  . mirtazapine  30 mg Oral QHS  . mometasone-formoterol  2 puff Inhalation BID  . multivitamin with minerals  1 tablet Oral Daily  . nicotine  21 mg Transdermal Daily  . OLANZapine  2.5 mg Oral QHS  . sertraline  200 mg Oral Daily   Continuous Infusions: . sodium chloride 100 mL/hr at 02/12/19 0619  . ceFEPime (MAXIPIME) IV 2 g (02/12/19 0927)   PRN Meds:.acetaminophen **OR** acetaminophen, guaiFENesin-dextromethorphan, hydrOXYzine, LORazepam, nitroGLYCERIN, ondansetron **OR** ondansetron (ZOFRAN) IV, sucralfate   Data Review:   Micro Results Recent Results (from the past 240 hour(s))  Blood Culture (routine x 2)     Status: None (Preliminary result)   Collection Time: 02/11/19  1:42 AM  Result Value Ref Range Status   Specimen Description BLOOD LEFT WRIST  Final   Special Requests   Final    BOTTLES DRAWN AEROBIC AND ANAEROBIC Blood Culture adequate volume   Culture   Final    NO GROWTH 1 DAY Performed at [facility], [address], [place], [place] [address]    Report Status PENDING  Incomplete  Blood Culture (routine x 2)     Status: None (Preliminary result)   Collection Time: 02/11/19  1:42 AM  Result Value Ref Range Status   Specimen Description BLOOD RIGHT ASSIST CONTROL  Final   Special Requests   Final    BOTTLES DRAWN AEROBIC AND ANAEROBIC Blood Culture adequate volume   Culture   Final    NO GROWTH 1 DAY Performed at [facility] [address], [place], [place] [address]    Report Status PENDING  Incomplete  MRSA PCR Screening     Status: None   Collection Time:  02/11/19  5:39 AM  Result Value Ref Range Status   MRSA by PCR NEGATIVE NEGATIVE Final    Comment:        The GeneXpert MRSA Assay (FDA approved  for NASAL specimens only), is one component of a comprehensive MRSA colonization surveillance program. It is not intended to diagnose MRSA infection nor to guide or monitor treatment for MRSA infections. Performed at [facility], [address] [place], [place] [address]     Radiology Reports Ct Chest Wo Contrast  Result Date: 02/11/2019 CLINICAL DATA:  COPD exacerbation, short of breath EXAM: CT CHEST WITHOUT CONTRAST TECHNIQUE: Multidetector CT imaging of the chest was performed following the standard protocol without IV contrast. COMPARISON:  Chest x-ray 02/10/2019, CT chest 10/19/2018 09/13/2018, 02/01/2018, 12/07/2017, 10/26/2017, PET-CT 10/13/2017 FINDINGS: Cardiovascular: Limited without intravenous contrast. Nonaneurysmal aorta. Moderate aortic atherosclerosis. Coronary vascular calcification. Normal heart size. No pericardial effusion Mediastinum/Nodes: Midline trachea. No thyroid mass. No significant adenopathy. Esophagus within normal limits. Lungs/Pleura: Moderate severe emphysema. Stable 10 x 15 mm left upper lobe pulmonary nodule, series 3, image number 44. Stable 5 mm left apical lung nodule, series 3, image number 20. Stable 4 mm left lower lobe pulmonary nodule, series 3, image number 69. Small foci of ground-glass density within the anterior and posterior right upper lobe. Interval development of spiculated mass within the right upper lobe, measuring 1.8 x 2.8 cm. This is contiguous with the pleural surface and appears to contain a small amount of central cavitation. Upper Abdomen: Stable 2.7 x 3 cm left adrenal adenoma. Stable 15 mm right adrenal adenoma. No acute abnormality. Musculoskeletal: No acute or suspicious osseous abnormality. IMPRESSION: 1. Progressed masslike consolidation within the right upper lobe in the region of previous waxing and waning nodule, this is contiguous with the pleural surface and demonstrates spiculated margin and probable small focus of  central cavitation. Findings could be secondary to infection superimposed on chronic nodule however cavitary neoplasm is also a concern. Pulmonary consultation is suggested. 2. There are multiple additional lung nodules which have not significantly changed. 3. Small foci of ground-glass density in the right upper lobe may reflect small foci of infection. 4. Moderate severe emphysema 5. Stable bilateral adrenal adenomas Aortic Atherosclerosis (ICD10-I70.0) and Emphysema (ICD10-J43.9). Electronically Signed   By: Donavan Foil M.D.   On: 02/11/2019 00:29   Dg Chest Portable 1 View  Result Date: 02/10/2019 CLINICAL DATA:  Shortness of Breath EXAM: PORTABLE CHEST 1 VIEW COMPARISON:  01/28/2019 FINDINGS: Previously seen right upper lobe consolidation has improved slightly since prior study. Left lung clear. Heart is normal size. No effusions or acute bony abnormality. IMPRESSION: Right upper lobe infiltrate again noted, slightly improved since prior study. Electronically Signed   By: Rolm Baptise M.D.   On: 02/10/2019 22:24   Dg Chest Portable 1 View  Result Date: 01/28/2019 CLINICAL DATA:  Shortness of breath and chest discomfort EXAM: PORTABLE CHEST 1 VIEW COMPARISON:  12/31/2018 FINDINGS: Cardiac shadow is stable. The lungs are hyperinflated. Patchy lateral infiltrative density is noted in the right upper lobe new from the prior exam consistent with acute infiltrate. No sizable effusion is noted. Degenerative changes of the thoracic spine are noted. IMPRESSION: COPD. New right upper lobe infiltrate laterally. Followup PA and lateral chest X-ray is recommended in 3-4 weeks following trial of antibiotic therapy to ensure resolution and exclude underlying malignancy. Electronically Signed   By: Inez Catalina M.D.   On: 01/28/2019 22:10     CBC Recent Labs  Lab 02/10/19 2204 02/12/19 0442  WBC 10.9* 9.3  HGB 11.9* 10.2*  HCT 38.1* 32.7*  PLT 253 203  MCV 93.6 95.3  MCH 29.2 29.7  MCHC 31.2 31.2  RDW  12.6 12.8    Chemistries  Recent Labs  Lab 02/10/19 2204 02/12/19 0442  NA 136 141  K 3.8 4.0  CL 95* 105  CO2 33* 32  GLUCOSE 125* 95  BUN 16 17  CREATININE 0.60* 0.67  CALCIUM 8.9 8.7*   ------------------------------------------------------------------------------------------------------------------ estimated creatinine clearance is 76.4 mL/min (by C-G formula based on SCr of 0.67 mg/dL). ------------------------------------------------------------------------------------------------------------------ No results for input(s): HGBA1C in the last 72 hours. ------------------------------------------------------------------------------------------------------------------ No results for input(s): CHOL, HDL, LDLCALC, TRIG, CHOLHDL, LDLDIRECT in the last 72 hours. ------------------------------------------------------------------------------------------------------------------ Recent Labs    02/10/19 2204  TSH 2.917   ------------------------------------------------------------------------------------------------------------------ No results for input(s): VITAMINB12, FOLATE, FERRITIN, TIBC, IRON, RETICCTPCT in the last 72 hours.  Coagulation profile Recent Labs  Lab 02/10/19 2204  INR 0.95    No results for input(s): DDIMER in the last 72 hours.  Cardiac Enzymes Recent Labs  Lab 02/10/19 2204  TROPONINI <0.03   ------------------------------------------------------------------------------------------------------------------ Invalid input(s): POCBNP    Assessment & Plan   Patient 68 year old recently hospitalized for pneumonia readmitted for same noted to have a possible mass  1. Pneumonia: Healthcare associated; continue cefepime for now 2. Lung mass: Cavitary center with spiculated margins.    Appreciate pulmonary assessment at this point too high risk for any further evaluation patient has had a biopsy in the past 3. CAD: Stable; continue aspirin. 4.  Hypertension: Controlled; continue to monitor 5. COPD: With acute exacerbation we will treat with nebulizer therapy steroids 6.  Nicotine abuse smoking cessation provided   Prognosis poor palliative care consult recommended by pulmonary     Code Status Orders  (From admission, onward)         Start     Ordered   02/11/19 0438  Full code  Continuous     02/11/19 0437        Code Status History    Date Active Date Inactive Code Status Order ID Comments User Context   01/29/2019 0012 01/31/2019 1625 Full Code [id number]  Lance Coon, MD Inpatient   12/31/2018 1909 01/02/2019 1808 Full Code [id number]  [name], MD ED   10/01/2018 1243 10/01/2018 1759 Full Code [id number]  Lucious Groves, RN Inpatient   09/13/2018 1949 09/15/2018 1719 Full Code [id number]  Loletha Grayer, MD ED   08/25/2018 0140 08/26/2018 1737 Full Code [id number]  Harrie Foreman, MD Inpatient   07/02/2018 1903 07/03/2018 2216 Full Code [id number]  Hillary Bow, MD ED   12/26/2017 0129 12/30/2017 2106 Full Code [id number]  Amelia Jo, MD Inpatient           Consults pulmonary   DVT Prophylaxis  Lovenox  Lab Results  Component Value Date   PLT 203 02/12/2019     Time Spent in minutes   35 minutes  Greater than 50% of time spent in care coordination and counseling patient regarding the condition and plan of care.   [name] M.D on 02/12/2019 at 2:21 PM  Between 7am to 6pm - Pager - [phone]  After 6pm go to [email or web address] - Proofreader  [employer]   Office  [phone]

## 2019-02-13 DIAGNOSIS — Z515 Encounter for palliative care: Secondary | ICD-10-CM

## 2019-02-13 DIAGNOSIS — Z7189 Other specified counseling: Secondary | ICD-10-CM

## 2019-02-13 MED ORDER — LORAZEPAM 1 MG PO TABS
1.0000 mg | ORAL_TABLET | ORAL | Status: DC
  Administered 2019-02-13 – 2019-02-14 (×6): 1 mg via ORAL
  Filled 2019-02-13 (×6): qty 1

## 2019-02-13 NOTE — Consult Note (Signed)
Consultation Note Date: 02/13/2019   Patient Name: Leslie Sims [facility]  DOB: November 09, 1951  MRN: [id number]  Age / Sex: 68 y.o., male  PCP: [name], MD Referring Physician: [name], MD  Reason for Consultation: Establishing goals of care  HPI/Patient Profile: 68 y.o. male  with past medical history of CAD, HTN, COPD on home oxygen, smoking, and pulmonary nodules admitted on 02/10/2019 with shortness of breath. Patient with recent admission with COPD exacerbation. Patient diagnosed with COPD exacerbation and also being treated for pna. CT exam revealed mass with spiculated margins. Further biopsy is high risk d/t severe emphysema. PMT consulted for [place].  Clinical Assessment and Goals of Care: I have reviewed medical records including EPIC notes, labs and imaging, received report from Dr. Posey Pronto, assessed the patient and then met with the patient  to discuss diagnosis prognosis, [place], EOL wishes, disposition and options.  I introduced Palliative Medicine as specialized medical care for people living with serious illness. It focuses on providing relief from the symptoms and stress of a serious illness. The goal is to improve quality of life for both the patient and the family.  We discussed a brief life review of the patient. 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 He also tells me he has several animals he enjoys taking care of - including 2 horses. He tells me spending time with his children and grandchildren is important to him. He also tells me about his spiritual beliefs. He shares that his wife passed away 1.5 years ago and he has struggled with anxiety and depression since then.   We discussed with anxiety and depression - he takes zoloft and ativan. He tells me he has an appointment with a psychiatrist in 2 weeks. He tells me he used to go to the psychiatrist regularly but decided to stop going. We discussed the  importance of his mental health - especially anxiety as it can lead to increased shortness of breath. He expresses understanding. We discussed regimen in the hospital with scheduled ativan q4hr. He asks about increasing dose; however we discussed seeing how he does with increased frequency first. We discussed that low dose morphine can be effective for relieving shortness of breath - he shares his concerns about morphine only being for patients who are dying and he does not want to feel like he is dying. Education provided that morphine can be used for symptom management.   As far as functional and nutritional status, he tells me his activity is limited d/t shortness of breath. He describes how is home is setup with oxygen so that he can move freely and care for himself independently. Good appetite.    We discussed his current illness and what it means in the larger context of his on-going co-morbidities.  Natural disease trajectory and expectations at EOL were discussed. He tells me about his COPD and lung nodules. He shares that he is "not worried" about his lung masses. He is, however, concerned with COPD. He is concerned about being dependent on oxygen. He tells me his hopes of not having to depend on it. He tells me that he plans to quit smoking. He tells me his sister and mother passed away with COPD and he has a good understanding of the illness.    I attempted to elicit values and goals of care important to the patient.  He speaks of his spiritual beliefs and hopes for healing. He tells me that he is "not ready to  give up" and "I still have things I want to do". He also shares "I'm ready when my time comes".  We discussed what being "ready" means and how this affects his healthcare decisions. He tells me he hopes to pass naturally in his sleep and realizes that he may be nearing the end of life due to his lung disease. He tells me he is starting to prepare for the end of life - preparing a will.    Advance directives, concepts specific to code status, artifical feeding and hydration, and rehospitalization were considered and discussed. We discussed code status and patient elects full code/full scope. We had a long discussion about his wish to pass naturally. Patient would like to continue thinking about code status - for now, remain a full code. He does share he would never want tracheostomy or long-term ventilation.   We discussed HCPOA - he tells me he would want his 2 daughters Jewel and Roselyn Reef - to make decisions for him jointly if he was unable. I encouraged him to continue discussions about his wishes with his daughters.   I offered chaplain consult and patient politely declined.   Questions and concerns were addressed. The family was encouraged to call with questions or concerns.   Primary Decision Maker PATIENT  If patient unable, he would want his 2 daughter Jewel and Roselyn Reef to make decisions together   SUMMARY OF RECOMMENDATIONS   - Initial palliative discussion - long discussion about goals of care and education about disease and symptoms  - patient wishes for natural death - to pass quietly in his sleep - also requests to remain full code - he requests more time to consider code status - PMT to f/u tomorrow  Code Status/Advance Care Planning:  Full code  Symptom Management:   Continue scheduled ativan q4hr and zoloft  Low dose morphine discussed for dyspnea - patient does not feel it is needed now but would be open if shortness of breath increases for symptom relief  Palliative Prophylaxis:   Aspiration  Additional Recommendations (Limitations, Scope, Preferences):  No Tracheostomy  Psycho-social/Spiritual:   Desire for further Chaplaincy support:no  Additional Recommendations: N/A  Prognosis:   Unable to determine  Discharge Planning: To Be Determined      Primary Diagnoses: Present on Admission: . HCAP (healthcare-associated pneumonia)   I  have reviewed the medical record, interviewed the patient and family, and examined the patient. The following aspects are pertinent.  Past Medical History:  Diagnosis Date  . Allergic rhinitis   . Anxiety   . Back pain   . Colon polyps 08/2012   colonoscopy; multiple colon polyps; repeat colonoscopy in one year.  [name] COPD (chronic obstructive pulmonary disease) ([place])   . Coronary artery disease 11/2009   a. late presenting ant MI; b. LHC 100% pLAD s/p PCI/DES, 99% mRCA s/p PCI/DES, EF 35%; c. nuclear stress test 05/13: prior ant/inf infarcts w/o ischemia, EF 43%; d. LHC 02/14: widely patent stents with no other obs dz, EF 40%; e. LHC 11/17: LM nl, mLAD 10%, patent LAD stent, dLAD 20%, p-mRCA 10%, mRCA 40%, patent RCA stent, EF 35-45%   . Depression   . GERD (gastroesophageal reflux disease)   . Headache(784.0)   . Helicobacter pylori (H. pylori)   . Hemorrhoids   . Hernia    inguinal  . Hyperlipidemia   . Hypertension   . Insomnia   . Ischemic cardiomyopathy   . Myalgia   . Peptic ulcer   .  Pulmonary nodules   . ST elevation (STEMI) myocardial infarction involving left anterior descending coronary artery ([place]) 11/2009   a. s/p PCI to the LAD  . Status post dilation of esophageal narrowing 2000  . Tinea pedis   . Wears dentures    partial upper   Social History   Socioeconomic History  . Marital status: Widowed    Spouse name: Not on file  . Number of children: 5  . Years of education: Not on file  . Highest education level: Not on file  Occupational History  . 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 Financial resource strain: Not on file  . Food insecurity:    Worry: Not on file    Inability: Not on file  . Transportation needs:    Medical: Not on file    Non-medical: Not on file  Tobacco Use  . Smoking status: Current Every Day Smoker    Packs/day: 1.00    Years: 42.00    Pack years: 42.00    Types: Cigarettes  .  Smokeless tobacco: Never Used  Substance and Sexual Activity  . Alcohol use: No    Alcohol/week: 0.0 standard drinks  . Drug use: No  . Sexual activity: Yes  Lifestyle  . Physical activity:    Days per week: Not on file    Minutes per session: Not on file  . Stress: Not on file  Relationships  . Social connections:    Talks on phone: Not on file    Gets together: Not on file    Attends religious service: Not on file    Active member of club or organization: Not on file    Attends meetings of clubs or organizations: Not on file    Relationship status: Not on file  Other Topics Concern  . Not on file  Social History Narrative   Marital status: married x 32 years; happily married.      Children: 4 children;  1 stepchild and 15 grandchildren; no great grandchildren.      Lives: with wife; 2 dogs, 1 cat.      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      Tobacco: 1 ppd x 45 years      Alcohol: none; previous alcoholism      Drugs: none      Exercise: no formal exercise; physically demanding job; owns horses.      Seatbelts:  100%      Guns: one loaded unsecured gun in the home.      Sunscreen: none   Family History  Problem Relation Age of Onset  . Heart attack Brother        Brother #1  . Diabetes Brother   . Hypertension Brother        #3  . Coronary artery disease Father 3       deceased  . Heart attack Father   . Diabetes Father   . Heart disease Father   . COPD Mother 56       deceased  . Alcohol abuse Sister        polysubstance abuse  . COPD Sister   . Lung cancer Sister   . Alcohol abuse Sister        polysubstance abuse  . Penile cancer Brother   . Diabetes Brother    Scheduled Meds: . ARIPiprazole  5 mg Oral Daily  . aspirin  81 mg Oral  Daily  . atorvastatin  40 mg Oral Daily  . budesonide  0.25 mg Inhalation Daily  . docusate sodium  100 mg Oral BID  . enoxaparin (LOVENOX) injection  40 mg Subcutaneous Q24H  . feeding  supplement (ENSURE ENLIVE)  237 mL Oral TID BM  . furosemide  20 mg Oral Daily  . ipratropium-albuterol  3 mL Nebulization QID  . LORazepam  1 mg Oral Q4H while awake  . mouth rinse  15 mL Mouth Rinse BID  . mirtazapine  30 mg Oral QHS  . mometasone-formoterol  2 puff Inhalation BID  . multivitamin with minerals  1 tablet Oral Daily  . nicotine  21 mg Transdermal Daily  . OLANZapine  2.5 mg Oral QHS  . sertraline  200 mg Oral Daily   Continuous Infusions: . sodium chloride 100 mL/hr at 02/13/19 1503  . ceFEPime (MAXIPIME) IV Stopped (02/13/19 1022)   PRN Meds:.acetaminophen **OR** acetaminophen, guaiFENesin-dextromethorphan, hydrOXYzine, nitroGLYCERIN, ondansetron **OR** ondansetron (ZOFRAN) IV, sucralfate Allergies  Allergen Reactions  . Prozac [Fluoxetine Hcl] Shortness Of Breath  . Effexor Xr [Venlafaxine Hcl Er] Other (See Comments)    "Makes me feel funny"  . Wellbutrin [Bupropion] Other (See Comments)    "Makes me feel funny"   Review of Systems  Constitutional: Positive for activity change.  Respiratory: Positive for shortness of breath.   Psychiatric/Behavioral: The patient is nervous/anxious.     Physical Exam Constitutional:      General: He is not in acute distress.    Appearance: He is ill-appearing.  Cardiovascular:     Rate and Rhythm: Normal rate and regular rhythm.  Pulmonary:     Effort: Pulmonary effort is normal.  Abdominal:     Palpations: Abdomen is soft.  Musculoskeletal:     Right lower leg: No edema.     Left lower leg: No edema.  Skin:    General: Skin is warm and dry.  Neurological:     Mental Status: He is alert and oriented to person, place, and time.  Psychiatric:        Mood and Affect: Mood is anxious.     Vital Signs: BP 120/63 (BP Location: Left Arm)   Pulse 84   Temp 98 F (36.7 C) (Oral)   Resp 20   Ht _0  (1.753 m)   Wt 57.1 kg   SpO2 97%   BMI 18.59 kg/m  Pain Scale: 0-10 POSS *See Group Information*:  1-Acceptable,Awake and alert Pain Score: 0-No pain   SpO2: SpO2: 97 % O2 Device:SpO2: 97 % O2 Flow Rate: .O2 Flow Rate (L/min): 2.5 L/min  IO: Intake/output summary:   Intake/Output Summary (Last 24 hours) at 02/13/2019 1526 Last data filed at 02/13/2019 1503 Gross per 24 hour  Intake 3423.8 ml  Output 2850 ml  Net 573.8 ml    LBM: Last BM Date: 02/12/19 Baseline Weight: Weight: 58.7 kg Most recent weight: Weight: 57.1 kg     Palliative Assessment/Data: PPS 50%    Time Total: 80 minutes Greater than 50%  of this time was spent counseling and coordinating care related to the above assessment and plan.  Juel Burrow, DNP, AGNP-C Palliative Medicine Team [phone] Pager: [phone]

## 2019-02-13 NOTE — Consult Note (Signed)
Leslie Sims      Assessment and Plan:  Acute exacerbation of COPD with acute hypoxic respiratory failure. Pneumonia. - Agree with continued empiric treatment for pneumonia, can likely de-escalate antibiotics. --Continue duonebs.  --Advance activity as tolerated.    Cavitary lung nodule. Severe/end-stage emphysema with frequent admissions, severe debilitating dyspnea. - Patient has a history of slowly growing right upper lobe lung nodule, status post ENB biopsy in the past which was negative.  The nodule has undergone recent cavitation. - Patient is being followed by oncology.  Given his severe emphysema, further biopsy would be high risk.  Further follow-up can be continued outpatient as deemed necessary.  --Palliative care Sims may be helpful.     Date: 02/13/2019  MRN# [id number] Leslie Sims Leslie Sims, Leslie Sims    Leslie Sims is a 68 y.o. old male seen in [facility] for chief complaint of:    Chief Complaint  Patient presents with  . Shortness of Breath    Pt feels better today. Current down to 2.5L which is his home level of oxygen. No new complaints.   **CT chest 02/10/2019>> imaging personally reviewed, severe emphysematous changes throughout both lungs.  There is a right upper lobe and left upper lobe nodule seen.  Nodule appears to be larger than previous, with area of central cavitation.  Medication:    Current Facility-Administered Medications:  .  0.9 %  sodium chloride infusion, , Intravenous, Continuous, Harrie Foreman, MD, Last Rate: 100 mL/hr at 02/13/19 2500 .  acetaminophen (TYLENOL) tablet 650 mg, 650 mg, Oral, Q6H PRN **OR** acetaminophen (TYLENOL) suppository 650 mg, 650 mg, Rectal, Q6H PRN, Harrie Foreman, MD .  ARIPiprazole (ABILIFY) tablet 5 mg, 5 mg, Oral, Daily, Harrie Foreman, MD, 5 mg at 02/13/19 0947 .  aspirin chewable tablet 81 mg, 81 mg, Oral, Daily, Harrie Foreman, MD, 81 mg at  02/13/19 0946 .  atorvastatin (LIPITOR) tablet 40 mg, 40 mg, Oral, Daily, Harrie Foreman, MD, 40 mg at 02/13/19 0946 .  budesonide (PULMICORT) nebulizer solution 0.25 mg, 0.25 mg, Inhalation, Daily, Harrie Foreman, MD, 0.25 mg at 02/13/19 0748 .  ceFEPIme (MAXIPIME) 2 g in sodium chloride 0.9 % 100 mL IVPB, 2 g, Intravenous, Q8H, Hinda Kehr, MD, Last Rate: 200 mL/hr at 02/13/19 0952, 2 g at 02/13/19 0952 .  docusate sodium (COLACE) capsule 100 mg, 100 mg, Oral, BID, Harrie Foreman, MD, 100 mg at 02/13/19 0946 .  enoxaparin (LOVENOX) injection 40 mg, 40 mg, Subcutaneous, Q24H, Harrie Foreman, MD, 40 mg at 02/12/19 2258 .  feeding supplement (ENSURE ENLIVE) (ENSURE ENLIVE) liquid 237 mL, 237 mL, Oral, TID BM, Harrie Foreman, MD, 237 mL at 02/13/19 0947 .  furosemide (LASIX) tablet 20 mg, 20 mg, Oral, Daily, Harrie Foreman, MD, 20 mg at 02/13/19 0945 .  guaiFENesin-dextromethorphan (ROBITUSSIN DM) 100-10 MG/5ML syrup 5 mL, 5 mL, Oral, Q4H PRN, Harrie Foreman, MD .  hydrOXYzine (ATARAX/VISTARIL) tablet 25 mg, 25 mg, Oral, BID PRN, Harrie Foreman, MD .  ipratropium-albuterol (DUONEB) 0.5-2.5 (3) MG/3ML nebulizer solution 3 mL, 3 mL, Nebulization, QID, Harrie Foreman, MD, 3 mL at 02/13/19 1141 .  LORazepam (ATIVAN) tablet 1 mg, 1 mg, Oral, Q4H while awake, [name], MD .  MEDLINE mouth rinse, 15 mL, Mouth Rinse, BID, Harrie Foreman, MD, 15 mL at 02/12/19 2301 .  mirtazapine (REMERON) tablet 30 mg, 30 mg, Oral, QHS, Harrie Foreman, MD, 30 mg at  02/12/19 2259 .  mometasone-formoterol (DULERA) 200-5 MCG/ACT inhaler 2 puff, 2 puff, Inhalation, BID, Harrie Foreman, MD, 2 puff at 02/13/19 [phone] .  multivitamin with minerals tablet 1 tablet, 1 tablet, Oral, Daily, Harrie Foreman, MD, 1 tablet at 02/13/19 0946 .  nicotine (NICODERM CQ - dosed in mg/24 hours) patch 21 mg, 21 mg, Transdermal, Daily, [name], MD, 21 mg at 02/13/19 0947 .   nitroGLYCERIN (NITROSTAT) SL tablet 0.4 mg, 0.4 mg, Sublingual, Q5 min PRN, Harrie Foreman, MD .  OLANZapine [facility]) tablet 2.5 mg, 2.5 mg, Oral, QHS, Harrie Foreman, MD, 2.5 mg at 02/12/19 2259 .  ondansetron (ZOFRAN) tablet 4 mg, 4 mg, Oral, Q6H PRN **OR** ondansetron (ZOFRAN) injection 4 mg, 4 mg, Intravenous, Q6H PRN, Harrie Foreman, MD .  sertraline (ZOLOFT) tablet 200 mg, 200 mg, Oral, Daily, Harrie Foreman, MD, 200 mg at 02/13/19 0945 .  sucralfate (CARAFATE) 1 GM/10ML suspension 1 g, 1 g, Oral, QID PRN, Harrie Foreman, MD   Allergies:  Prozac [fluoxetine hcl]; Effexor xr [venlafaxine hcl er]; and Wellbutrin [bupropion]  Review of Systems:  Constitutional: Feels well. Cardiovascular: Denies chest pain, exertional chest pain.  Pulmonary: Denies hemoptysis, pleuritic chest pain.   The remainder of systems were reviewed and were found to be negative other than what is documented in the HPI.    Physical Examination:   VS: BP 120/63 (BP Location: Left Arm)   Pulse 81   Temp 98 F (36.7 C) (Oral)   Resp 15   Ht 5\' 9"  (1.753 m)   Wt 57.1 kg   SpO2 99%   BMI 18.59 kg/m   General Appearance: No distress  Neuro:without focal findings, mental status, speech normal, alert and oriented HEENT: PERRLA, EOM intact Pulmonary: No wheezing, No rales  CardiovascularNormal S1,S2.  No m/r/g.  Abdomen: Benign, Soft, non-tender, No masses Renal:  No costovertebral tenderness  GU:  No performed at this time. Endoc: No evident thyromegaly, no signs of acromegaly or Cushing features Skin:   warm, no rashes, no ecchymosis  Extremities: normal, no cyanosis, clubbing.      LABORATORY PANEL:   CBC Recent Labs  Lab 02/12/19 0442  WBC 9.3  HGB 10.2*  HCT 32.7*  PLT 203   ------------------------------------------------------------------------------------------------------------------  Chemistries  Recent Labs  Lab 02/12/19 0442  NA 141  K 4.0  CL 105  CO2  32  GLUCOSE 95  BUN 17  CREATININE 0.67  CALCIUM 8.7*   ------------------------------------------------------------------------------------------------------------------  Cardiac Enzymes Recent Labs  Lab 02/10/19 2204  TROPONINI <0.03   ------------------------------------------------------------  RADIOLOGY:  No results found.     Thank  you for the [facility] and for allowing [facility] to assist in the care of your patient. Our recommendations are noted above.  Please contact us if we can be of further service.   Marda Stalker, M.D., F.C.C.P.  Board Certified in Internal Medicine, Pulmonary Medicine, [place], and Sleep Medicine.  [facility] Office Number: [phone]   02/13/2019

## 2019-02-13 NOTE — Progress Notes (Signed)
[place] [facility]                                                                                                                                                                                  Patient Demographics   Leslie Sims, is a 68 y.o. male, DOB - January 21, 1951, WUJ:[id number]  Admit date - 02/10/2019   Admitting Physician Harrie Foreman, MD  Outpatient Primary MD for the patient is [name], MD   LOS - 2  Subjective:  Patient anxious continues to have shortness of breath   Review of Systems:   CONSTITUTIONAL: No documented fever. No fatigue, weakness. No weight gain, no weight loss.  EYES: No blurry or double vision.  ENT: No tinnitus. No postnasal drip. No redness of the oropharynx.  RESPIRATORY: No cough, no wheeze, no hemoptysis.  Positive dyspnea.  CARDIOVASCULAR: No chest pain. No orthopnea. No palpitations. No syncope.  GASTROINTESTINAL: No nausea, no vomiting or diarrhea. No abdominal pain. No melena or hematochezia.  GENITOURINARY: No dysuria or hematuria.  ENDOCRINE: No polyuria or nocturia. No heat or cold intolerance.  HEMATOLOGY: No anemia. No bruising. No bleeding.  INTEGUMENTARY: No rashes. No lesions.  MUSCULOSKELETAL: No arthritis. No swelling. No gout.  NEUROLOGIC: No numbness, tingling, or ataxia. No seizure-type activity.  PSYCHIATRIC: No anxiety. No insomnia. No ADD.    Vitals:   Vitals:   02/13/19 0601 02/13/19 0751 02/13/19 1141 02/13/19 1210  BP:    120/63  Pulse:  73 (!) 103 81  Resp:  18 18 15   Temp:    98 F (36.7 C)  TempSrc:    Oral  SpO2:  98% 96% 99%  Weight: 57.1 kg     Height:        Wt Readings from Last 3 Encounters:  02/13/19 57.1 kg  01/28/19 63.2 kg  01/16/19 63.2 kg     Intake/Output Summary (Last 24 hours) at 02/13/2019 1336 Last data filed at 02/13/2019 1212 Gross per 24 hour  Intake 2477.54 ml  Output 2850 ml  Net -372.46 ml    Physical Exam:   GENERAL:  Pleasant-appearing in no apparent distress.  HEAD, EYES, EARS, NOSE AND THROAT: Atraumatic, normocephalic. Extraocular muscles are intact. Pupils equal and reactive to light. Sclerae anicteric. No conjunctival injection. No oro-pharyngeal erythema.  NECK: Supple. There is no jugular venous distention. No bruits, no lymphadenopathy, no thyromegaly.  HEART: Regular rate and rhythm,. No murmurs, no rubs, no clicks.  LUNGS: Decreased breath sound ABDOMEN: Soft, flat, nontender, nondistended. Has good bowel sounds. No hepatosplenomegaly appreciated.  EXTREMITIES: No evidence of any cyanosis, clubbing, or peripheral edema.  +2 pedal and radial  pulses bilaterally.  NEUROLOGIC: The patient is alert, awake, and oriented x3 with no focal motor or sensory deficits appreciated bilaterally.  SKIN: Moist and warm with no rashes appreciated.  Psych: Not anxious, depressed LN: No inguinal LN enlargement    Antibiotics   Anti-infectives (From admission, onward)   Start     Dose/Rate Route Frequency Ordered Stop   02/11/19 1000  vancomycin (VANCOCIN) IVPB 750 mg/150 ml premix  Status:  Discontinued     750 mg 150 mL/hr over 60 Minutes Intravenous Every 12 hours 02/11/19 0327 02/12/19 1252   02/11/19 1000  ceFEPIme (MAXIPIME) 2 g in sodium chloride 0.9 % 100 mL IVPB     2 g 200 mL/hr over 30 Minutes Intravenous Every 8 hours 02/11/19 0327     02/11/19 0145  vancomycin (VANCOCIN) IVPB 1000 mg/200 mL premix     1,000 mg 200 mL/hr over 60 Minutes Intravenous  Once 02/11/19 0133 02/11/19 0352   02/11/19 0145  ceFEPIme (MAXIPIME) 2 g in sodium chloride 0.9 % 100 mL IVPB     2 g 200 mL/hr over 30 Minutes Intravenous  Once 02/11/19 0133 02/11/19 0239      Medications   Scheduled Meds: . ARIPiprazole  5 mg Oral Daily  . aspirin  81 mg Oral Daily  . atorvastatin  40 mg Oral Daily  . budesonide  0.25 mg Inhalation Daily  . docusate sodium  100 mg Oral BID  . enoxaparin (LOVENOX) injection  40 mg  Subcutaneous Q24H  . feeding supplement (ENSURE ENLIVE)  237 mL Oral TID BM  . furosemide  20 mg Oral Daily  . ipratropium-albuterol  3 mL Nebulization QID  . LORazepam  1 mg Oral Q4H while awake  . mouth rinse  15 mL Mouth Rinse BID  . mirtazapine  30 mg Oral QHS  . mometasone-formoterol  2 puff Inhalation BID  . multivitamin with minerals  1 tablet Oral Daily  . nicotine  21 mg Transdermal Daily  . OLANZapine  2.5 mg Oral QHS  . sertraline  200 mg Oral Daily   Continuous Infusions: . sodium chloride 100 mL/hr at 02/13/19 0611  . ceFEPime (MAXIPIME) IV 2 g (02/13/19 0952)   PRN Meds:.acetaminophen **OR** acetaminophen, guaiFENesin-dextromethorphan, hydrOXYzine, nitroGLYCERIN, ondansetron **OR** ondansetron (ZOFRAN) IV, sucralfate   Data Review:   Micro Results Recent Results (from the past 240 hour(s))  Blood Culture (routine x 2)     Status: None (Preliminary result)   Collection Time: 02/11/19  1:42 AM  Result Value Ref Range Status   Specimen Description BLOOD LEFT WRIST  Final   Special Requests   Final    BOTTLES DRAWN AEROBIC AND ANAEROBIC Blood Culture adequate volume   Culture   Final    NO GROWTH 2 DAYS Performed at [facility], [address] [place], [place] [address]    Report Status PENDING  Incomplete  Blood Culture (routine x 2)     Status: None (Preliminary result)   Collection Time: 02/11/19  1:42 AM  Result Value Ref Range Status   Specimen Description BLOOD RIGHT ASSIST CONTROL  Final   Special Requests   Final    BOTTLES DRAWN AEROBIC AND ANAEROBIC Blood Culture adequate volume   Culture   Final    NO GROWTH 2 DAYS Performed at [facility], [address], [place], [place] [address]    Report Status PENDING  Incomplete  MRSA PCR Screening     Status: None   Collection  Time: 02/11/19  5:39 AM  Result Value Ref Range Status   MRSA by PCR NEGATIVE NEGATIVE Final    Comment:        The GeneXpert MRSA Assay (FDA approved for  NASAL specimens only), is one component of a comprehensive MRSA colonization surveillance program. It is not intended to diagnose MRSA infection nor to guide or monitor treatment for MRSA infections. Performed at [facility], [facility], [address] [place], [place] [address]     Radiology Reports Ct Chest Wo Contrast  Result Date: 02/11/2019 CLINICAL DATA:  COPD exacerbation, short of breath EXAM: CT CHEST WITHOUT CONTRAST TECHNIQUE: Multidetector CT imaging of the chest was performed following the standard protocol without IV contrast. COMPARISON:  Chest x-ray 02/10/2019, CT chest 10/19/2018 09/13/2018, 02/01/2018, 12/07/2017, 10/26/2017, PET-CT 10/13/2017 FINDINGS: Cardiovascular: Limited without intravenous contrast. Nonaneurysmal aorta. Moderate aortic atherosclerosis. Coronary vascular calcification. Normal heart size. No pericardial effusion Mediastinum/Nodes: Midline trachea. No thyroid mass. No significant adenopathy. Esophagus within normal limits. Lungs/Pleura: Moderate severe emphysema. Stable 10 x 15 mm left upper lobe pulmonary nodule, series 3, image number 44. Stable 5 mm left apical lung nodule, series 3, image number 20. Stable 4 mm left lower lobe pulmonary nodule, series 3, image number 69. Small foci of ground-glass density within the anterior and posterior right upper lobe. Interval development of spiculated mass within the right upper lobe, measuring 1.8 x 2.8 cm. This is contiguous with the pleural surface and appears to contain a small amount of central cavitation. Upper Abdomen: Stable 2.7 x 3 cm left adrenal adenoma. Stable 15 mm right adrenal adenoma. No acute abnormality. Musculoskeletal: No acute or suspicious osseous abnormality. IMPRESSION: 1. Progressed masslike consolidation within the right upper lobe in the region of previous waxing and waning nodule, this is contiguous with the pleural surface and demonstrates spiculated margin and probable small focus of  central cavitation. Findings could be secondary to infection superimposed on chronic nodule however cavitary neoplasm is also a concern. Pulmonary consultation is suggested. 2. There are multiple additional lung nodules which have not significantly changed. 3. Small foci of ground-glass density in the right upper lobe may reflect small foci of infection. 4. Moderate severe emphysema 5. Stable bilateral adrenal adenomas Aortic Atherosclerosis (ICD10-I70.0) and Emphysema (ICD10-J43.9). Electronically Signed   By: Donavan Foil M.D.   On: 02/11/2019 00:29   Dg Chest Portable 1 View  Result Date: 02/10/2019 CLINICAL DATA:  Shortness of Breath EXAM: PORTABLE CHEST 1 VIEW COMPARISON:  01/28/2019 FINDINGS: Previously seen right upper lobe consolidation has improved slightly since prior study. Left lung clear. Heart is normal size. No effusions or acute bony abnormality. IMPRESSION: Right upper lobe infiltrate again noted, slightly improved since prior study. Electronically Signed   By: Rolm Baptise M.D.   On: 02/10/2019 22:24   Dg Chest Portable 1 View  Result Date: 01/28/2019 CLINICAL DATA:  Shortness of breath and chest discomfort EXAM: PORTABLE CHEST 1 VIEW COMPARISON:  12/31/2018 FINDINGS: Cardiac shadow is stable. The lungs are hyperinflated. Patchy lateral infiltrative density is noted in the right upper lobe new from the prior exam consistent with acute infiltrate. No sizable effusion is noted. Degenerative changes of the thoracic spine are noted. IMPRESSION: COPD. New right upper lobe infiltrate laterally. Followup PA and lateral chest X-ray is recommended in 3-4 weeks following trial of antibiotic therapy to ensure resolution and exclude underlying malignancy. Electronically Signed   By: Inez Catalina M.D.   On: 01/28/2019 22:10     CBC Recent  Labs  Lab 02/10/19 2204 02/12/19 0442  WBC 10.9* 9.3  HGB 11.9* 10.2*  HCT 38.1* 32.7*  PLT 253 203  MCV 93.6 95.3  MCH 29.2 29.7  MCHC 31.2 31.2  RDW  12.6 12.8    Chemistries  Recent Labs  Lab 02/10/19 2204 02/12/19 0442  NA 136 141  K 3.8 4.0  CL 95* 105  CO2 33* 32  GLUCOSE 125* 95  BUN 16 17  CREATININE 0.60* 0.67  CALCIUM 8.9 8.7*   ------------------------------------------------------------------------------------------------------------------ estimated creatinine clearance is 74.3 mL/min (by C-G formula based on SCr of 0.67 mg/dL). ------------------------------------------------------------------------------------------------------------------ No results for input(s): HGBA1C in the last 72 hours. ------------------------------------------------------------------------------------------------------------------ No results for input(s): CHOL, HDL, LDLCALC, TRIG, CHOLHDL, LDLDIRECT in the last 72 hours. ------------------------------------------------------------------------------------------------------------------ Recent Labs    02/10/19 2204  TSH 2.917   ------------------------------------------------------------------------------------------------------------------ No results for input(s): VITAMINB12, FOLATE, FERRITIN, TIBC, IRON, RETICCTPCT in the last 72 hours.  Coagulation profile Recent Labs  Lab 02/10/19 2204  INR 0.95    No results for input(s): DDIMER in the last 72 hours.  Cardiac Enzymes Recent Labs  Lab 02/10/19 2204  TROPONINI <0.03   ------------------------------------------------------------------------------------------------------------------ Invalid input(s): POCBNP    Assessment & Plan   Patient 68 year old recently hospitalized for pneumonia readmitted for same noted to have a possible mass  1. Pneumonia: Healthcare associated; continue cefepime for now changed to oral tomorrow 2. Lung mass: Cavitary center with spiculated margins.    Appreciate pulmonary assessment at this point too high risk for any further evaluation patient has had a biopsy in the past 3. CAD: Stable;  continue aspirin. 4. Hypertension: Controlled; continue to monitor 5. COPD: With acute exacerbation we will treat with nebulizer therapy steroids 6.  Nicotine abuse smoking cessation provided   Prognosis poor palliative care consult recommended by pulmonary     Code Status Orders  (From admission, onward)         Start     Ordered   02/11/19 0438  Full code  Continuous     02/11/19 0437        Code Status History    Date Active Date Inactive Code Status Order ID Comments User Context   01/29/2019 0012 01/31/2019 1625 Full Code [id number]  Lance Coon, MD Inpatient   12/31/2018 1909 01/02/2019 1808 Full Code [id number]  [name], MD ED   10/01/2018 1243 10/01/2018 1759 Full Code [id number]  Lucious Groves, RN Inpatient   09/13/2018 1949 09/15/2018 1719 Full Code [id number]  Loletha Grayer, MD ED   08/25/2018 0140 08/26/2018 1737 Full Code [id number]  Harrie Foreman, MD Inpatient   07/02/2018 1903 07/03/2018 2216 Full Code [id number]  Hillary Bow, MD ED   12/26/2017 0129 12/30/2017 2106 Full Code [id number]  Amelia Jo, MD Inpatient           Consults pulmonary   DVT Prophylaxis  Lovenox  Lab Results  Component Value Date   PLT 203 02/12/2019     Time Spent in minutes   35 minutes  Greater than 50% of time spent in care coordination and counseling patient regarding the condition and plan of care.   [name] M.D on 02/13/2019 at 1:36 PM  Between 7am to 6pm - Pager - [phone]  After 6pm go to [email or web address] - Proofreader  [employer]   Office  [phone]

## 2019-02-14 MED ORDER — AMOXICILLIN-POT CLAVULANATE 875-125 MG PO TABS: 1.0000 | ORAL_TABLET | Freq: Two times a day (BID) | ORAL | 0 refills | Status: DC

## 2019-02-14 MED ORDER — PREDNISONE 10 MG (21) PO TBPK: ORAL_TABLET | ORAL | 0 refills | Status: DC

## 2019-02-14 MED ORDER — LORAZEPAM 1 MG PO TABS: 1.0000 mg | ORAL_TABLET | ORAL | 0 refills | Status: DC

## 2019-02-14 NOTE — Progress Notes (Signed)
Leslie Sims  A and O x 4. VSS. Pt tolerating diet well. No complaints of pain or nausea. IV removed intact, prescriptions given. Pt voiced understanding of discharge instructions with no further questions. Pt discharged via wheelchair with RN.   Allergies as of 02/14/2019      Reactions   Prozac [fluoxetine Hcl] Shortness Of Breath   Effexor Xr [venlafaxine Hcl Er] Other (See Comments)   "Makes me feel funny"   Wellbutrin [bupropion] Other (See Comments)   "Makes me feel funny"      Medication List    TAKE these medications   acetaminophen 325 MG tablet Commonly known as:  TYLENOL Take 2 tablets (650 mg total) by mouth every 6 (six) hours as needed for mild pain (or Fever >/= 101).   albuterol 108 (90 Base) MCG/ACT inhaler Commonly known as:  PROVENTIL HFA;VENTOLIN HFA Inhale 2 puffs into the lungs every 6 (six) hours as needed for wheezing or shortness of breath.   amoxicillin-clavulanate 875-125 MG tablet Commonly known as:  AUGMENTIN Take 1 tablet by mouth 2 (two) times daily for 7 days.   ARIPiprazole 5 MG tablet Commonly known as:  ABILIFY Take 5 mg by mouth daily.   aspirin 81 MG tablet Take 81 mg by mouth daily.   atorvastatin 40 MG tablet Commonly known as:  LIPITOR Take 1 tablet (40 mg total) by mouth daily.   budesonide 0.25 MG/2ML nebulizer solution Commonly known as:  PULMICORT Inhale 0.25 mg into the lungs daily.   feeding supplement (ENSURE ENLIVE) Liqd Take 237 mLs by mouth 3 (three) times daily between meals.   Fluticasone-Salmeterol 500-50 MCG/DOSE Aepb Commonly known as:  ADVAIR Inhale 1 puff into the lungs every 12 (twelve) hours.   furosemide 20 MG tablet Commonly known as:  LASIX Take 1 tablet (20 mg total) by mouth daily.   guaiFENesin-dextromethorphan 100-10 MG/5ML syrup Commonly known as:  ROBITUSSIN DM Take 5 mLs by mouth every 4 (four) hours as needed for cough.   hydrOXYzine 25 MG capsule Commonly known as:  VISTARIL Take 25  mg by mouth 2 (two) times daily as needed for anxiety or itching.   ipratropium-albuterol 0.5-2.5 (3) MG/3ML Soln Commonly known as:  DUONEB Take 3 mLs by nebulization 4 (four) times daily. DX:J44.9   LORazepam 1 MG tablet Commonly known as:  ATIVAN Take 1 tablet (1 mg total) by mouth every 4 (four) hours while awake. What changed:    when to take this  reasons to take this   mirtazapine 30 MG tablet Commonly known as:  REMERON Take 1 tablet (30 mg total) by mouth at bedtime.   multivitamin capsule Take 1 capsule by mouth daily.   nitroGLYCERIN 0.4 MG SL tablet Commonly known as:  NITROSTAT Place 1 tablet (0.4 mg total) under the tongue every 5 (five) minutes as needed.   OLANZapine 2.5 MG tablet Commonly known as:  ZYPREXA Take 2.5 mg by mouth at bedtime.   predniSONE 10 MG (21) Tbpk tablet Commonly known as:  STERAPRED UNI-PAK 21 TAB Start at 60mg  taper by 10mg  until complete   sertraline 100 MG tablet Commonly known as:  ZOLOFT Take 200 mg by mouth daily.   sucralfate 1 GM/10ML suspension Commonly known as:  CARAFATE Take 10 mLs (1 g total) by mouth 4 (four) times daily -  with meals and at bedtime. What changed:    when to take this  reasons to take this       Vitals:  02/14/19 0831 02/14/19 1117  BP: 117/66   Pulse: 70 77  Resp:  18  Temp:    SpO2:  97%    Francesco Sor

## 2019-02-14 NOTE — Care Management Important Message (Signed)
Copy of signed Medicare IM left with patient in room. 

## 2019-02-14 NOTE — Progress Notes (Signed)
Daily Progress Note   Patient Name: Leslie Sims [facility]       Date: 02/14/2019 DOB: 04/04/1951  Age: 68 y.o. MRN#: [id number] Attending Physician: [name], MD Primary Care Physician: [name], MD Admit Date: 02/10/2019  Reason for Consultation/Follow-up: Establishing goals of care  Subjective: Feels better, slightly short of breath, anxiety better controlled. Sitting up on side of bed eating breakfast.   Length of Stay: 3  Current Medications: Scheduled Meds:  . ARIPiprazole  5 mg Oral Daily  . aspirin  81 mg Oral Daily  . atorvastatin  40 mg Oral Daily  . budesonide  0.25 mg Inhalation Daily  . docusate sodium  100 mg Oral BID  . enoxaparin (LOVENOX) injection  40 mg Subcutaneous Q24H  . feeding supplement (ENSURE ENLIVE)  237 mL Oral TID BM  . furosemide  20 mg Oral Daily  . ipratropium-albuterol  3 mL Nebulization QID  . LORazepam  1 mg Oral Q4H while awake  . mouth rinse  15 mL Mouth Rinse BID  . mirtazapine  30 mg Oral QHS  . mometasone-formoterol  2 puff Inhalation BID  . multivitamin with minerals  1 tablet Oral Daily  . nicotine  21 mg Transdermal Daily  . OLANZapine  2.5 mg Oral QHS  . sertraline  200 mg Oral Daily    Continuous Infusions: . sodium chloride 100 mL/hr at 02/14/19 0601  . ceFEPime (MAXIPIME) IV 2 g (02/14/19 0837)    PRN Meds: acetaminophen **OR** acetaminophen, guaiFENesin-dextromethorphan, hydrOXYzine, nitroGLYCERIN, ondansetron **OR** ondansetron (ZOFRAN) IV, sucralfate  Physical Exam Constitutional:      General: He is not in acute distress.    Appearance: He is normal weight.  HENT:     Head: Normocephalic and atraumatic.  Cardiovascular:     Rate and Rhythm: Normal rate and regular rhythm.  Pulmonary:     Effort: No respiratory  distress.     Breath sounds: Decreased breath sounds present.  Abdominal:     Palpations: Abdomen is soft.  Musculoskeletal:     Right lower leg: No edema.  Skin:    General: Skin is warm and dry.  Neurological:     Mental Status: He is alert and oriented to person, place, and time.  Psychiatric:        Mood and Affect: Mood normal.        Behavior: Behavior normal.             Vital Signs: BP 117/66 (BP Location: Left Arm)   Pulse 70   Temp 98.7 F (37.1 C)   Resp 16   Ht 5\' 9"  (1.753 m)   Wt 57.7 kg   SpO2 98%   BMI 18.78 kg/m  SpO2: SpO2: 98 % O2 Device: O2 Device: Nasal Cannula O2 Flow Rate: O2 Flow Rate (L/min): 2.5 L/min  Intake/output summary:   Intake/Output Summary (Last 24 hours) at 02/14/2019 0919 Last data filed at 02/14/2019 0102 Gross per 24 hour  Intake 946.26 ml  Output 2425 ml  Net -1478.74 ml   LBM: Last BM Date: 02/12/19 Baseline Weight: Weight: 58.7 kg Most recent weight: Weight: 57.7 kg       Palliative Assessment/Data: PPS 50%  Flowsheet Rows     Most Recent Value  Intake Tab  Referral Department  Hospitalist  Unit at Time of Referral  Med/Surg Unit  Palliative Care Primary Diagnosis  Pulmonary  Date Notified  02/12/19  Palliative Care Type  New Palliative care  Reason for referral  Clarify Goals of Care  Date of Admission  02/11/19  Date first seen by Palliative Care  02/13/19  # of days Palliative referral response time  1 Day(s)  # of days IP prior to Palliative referral  1  Clinical Assessment  Palliative Performance Scale Score  50%  Psychosocial & Spiritual Assessment  Palliative Care Outcomes  Patient/Family meeting held?  Yes  Who was at the meeting?  patient  Palliative Care Outcomes  Clarified goals of care, Provided advance care planning, Provided psychosocial or spiritual support      Patient Active Problem List   Diagnosis Date Noted  . Goals of care, counseling/discussion   . Palliative care by specialist     . Healthcare-associated pneumonia 01/28/2019  . COPD with acute exacerbation ([place]) 12/31/2018  . Acute respiratory failure with hypoxia ([place]) 09/29/2018  . Acute respiratory failure with hypoxia and hypercapnia ([place]) 09/13/2018  . Acute on chronic respiratory failure with hypoxia and hypercapnia ([place]) 08/24/2018  . Chest pain 07/02/2018  . Grief reaction 01/13/2018  . COPD with hypoxia ([place]) 12/25/2017  . Unstable angina ([place])   . Benign neoplasm of ascending colon   . Benign neoplasm of descending colon   . Benign neoplasm of sigmoid colon   . Personal history of colonic polyps   . Pulmonary nodules/lesions, multiple 01/05/2015  . Bruit of left carotid artery 11/04/2014  . Sleep disorder 07/18/2013  . Seasonal and perennial allergic rhinitis 05/29/2013  . Bradycardia 04/20/2011  . SMOKER 07/30/2010  . CAD, NATIVE VESSEL 04/23/2010  . Hyperlipidemia 03/24/2010  . DEPRESSION/ANXIETY 03/24/2010  . Chronic systolic heart failure ([place]) 03/24/2010  . COPD, very severe ([place]) 03/24/2010  . GERD 03/24/2010  . Essential hypertension 11/21/2009    Palliative Care Assessment & Plan   HPI: 68 y.o. male  with past medical history of CAD, HTN, COPD on home oxygen, smoking, and pulmonary nodules admitted on 02/10/2019 with shortness of breath. Patient with recent admission with COPD exacerbation. Patient diagnosed with COPD exacerbation and also being treated for pna. CT exam revealed mass with spiculated margins. Further biopsy is high risk d/t severe emphysema. PMT consulted for [place].  Assessment: Follow up with [name] this morning. He reports a good night and morning. He feels anxiety is much better controlled. Still feels slightly short of breath but no work of breathing noted. We reviewed our conversation from yesterday - no questions or concerns and he would like to leave GOC as they were - remain full code/full scope - no long term ventilation/trach. He discusses his commitment to quit  smoking. He shares his fears of having to return to the hospital and again talks about "life left to live".   We discussed outpatient palliative to follow up with him at home and continue [place] discussions. He tells me he would find this helpful.  All questions addressed. Patient encouraged to call with questions or concerns.   Recommendations/Plan:  CM consult for OP palliative on discharge  Full code/full scope  Continue scheduled ativan and zoloft  Low dose morphine for dyspnea discussed - shortness of breath much better today - he does not feel he needs it  Goals of Care and Additional  Recommendations:  Limitations on Scope of Treatment: No Tracheostomy  Code Status:  Full code  Prognosis:   Unable to determine  Discharge Planning:  Home with [place] was discussed with patient  Thank you for allowing the Palliative Medicine Team to assist in the care of this patient.   Total Time 35 minutes Prolonged Time Billed  no       Greater than 50%  of this time was spent counseling and coordinating care related to the above assessment and plan.  Juel Burrow, DNP, [facility] Palliative Medicine Team Team Phone # [phone]  Pager [phone]

## 2019-02-14 NOTE — Progress Notes (Signed)
New referral for Outpatient Palliative to follow at home received from [facility]. Patient to discharge home today, will resume home health thru [place] care. Patient information faxed to referral. Flo Shanks BSN, RN [facility] Clinical Liaison [facility] [phone]

## 2019-02-14 NOTE — Care Management Note (Signed)
Case Management Note  Patient Details  Name: Leslie Sims MRN: [id number] Date of Birth: Jul 07, 1951   Patient discharged home today.Patient lives at home alone. Patient states that his daughters and brother in law live locally.   Brother in Sports coach provides transportation.  Patient states he has chronic home O2 through [place].  Family to bring portable tank at discharge. Patient agreeable to home health services.  Patient states that he does not have a preference of home health agency.  CMS Medicare.gov Compare Post Acute Care list reviewed with patient.   Referral made to [place] COPD protocol signed by MD and provided to [facility] with [place].  PCP Dr Tamala Julian.  [place].  Patient denies issues obtaining medications. RNCM signing off   Subjective/Objective:                    Action/Plan:   Expected Discharge Date:  02/14/19               Expected Discharge Plan:  [place]  In-House Referral:     Discharge planning Services  CM Consult  Post Acute Care Choice:  Home Health Choice offered to:  Patient  DME Arranged:    DME Agency:     HH Arranged:  RN, Respirator Therapy [place] Agency:  [place]  Status of Service:  Completed, signed off  If discussed at [place] of Stay Meetings, dates discussed:    Additional Comments:  Beverly Sessions, RN 02/14/2019, 4:50 PM

## 2019-02-15 NOTE — Discharge Summary (Signed)
[facility] [place] [facility], 68 y.o., DOB 02/08/1951, MRN [id number]. 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  At that time he was treated with pneumonia.  Patient was seen in the ER again and further work-up showed possible lung mass with cavitary center.  Therefore pulmonary was consulted.  His primary pulmonologist is Dr. Jamal Collin who states that patient has had this type of changes on his lung in the past and has required a biopsy which was negative.  He felt that patient COPD is getting worse.  And recommended palliative care consult.  They stated that he is too high risk for any type of biopsy or bronchoscopy.  Patient was seen in consultation by palliative care team.  He still wants full code.  And aggressive treatment.   I explained to the patient that his breathing may not improve than what it was currently.         Consults  pulmonary/intensive care  Significant Tests:  See full reports for all details     Ct Chest Wo Contrast  Result Date: 02/11/2019 CLINICAL DATA:  COPD exacerbation, short of breath EXAM: CT CHEST WITHOUT CONTRAST TECHNIQUE: Multidetector CT imaging of the chest was performed following the standard protocol without IV contrast. COMPARISON:  Chest x-ray 02/10/2019, CT chest 10/19/2018 09/13/2018, 02/01/2018, 12/07/2017, 10/26/2017, PET-CT 10/13/2017 FINDINGS:  Cardiovascular: Limited without intravenous contrast. Nonaneurysmal aorta. Moderate aortic atherosclerosis. Coronary vascular calcification. Normal heart size. No pericardial effusion Mediastinum/Nodes: Midline trachea. No thyroid mass. No significant adenopathy. Esophagus within normal limits. Lungs/Pleura: Moderate severe emphysema. Stable 10 x 15 mm left upper lobe pulmonary nodule, series 3, image number 44. Stable 5 mm left apical lung nodule, series 3, image number 20. Stable 4 mm left lower lobe pulmonary nodule, series 3, image number 69. Small foci of ground-glass density within the anterior and posterior right upper lobe. Interval development of spiculated mass within the right upper lobe, measuring 1.8 x 2.8 cm. This is contiguous with the pleural surface and appears to contain a small amount of central cavitation. Upper Abdomen: Stable 2.7 x 3 cm left adrenal adenoma. Stable 15 mm right adrenal adenoma. No acute abnormality. Musculoskeletal: No acute or suspicious osseous abnormality. IMPRESSION: 1. Progressed masslike consolidation within the right upper lobe in the region of previous waxing and waning nodule, this is contiguous with the pleural surface and demonstrates spiculated margin and probable small focus of central cavitation. Findings could be secondary to infection superimposed on chronic nodule however cavitary neoplasm is also a concern. Pulmonary consultation is suggested. 2. There are multiple additional lung nodules which have not significantly changed. 3. Small foci of ground-glass density in the right upper lobe may reflect small foci of infection. 4. Moderate severe emphysema 5. Stable bilateral adrenal adenomas Aortic Atherosclerosis (ICD10-I70.0) and Emphysema (ICD10-J43.9). Electronically Signed   By: Donavan Foil M.D.   On: 02/11/2019 00:29   Dg Chest Portable 1 View  Result Date: 02/10/2019 CLINICAL DATA:  Shortness of Breath EXAM: PORTABLE CHEST 1 VIEW COMPARISON:  01/28/2019 FINDINGS: Previously seen right upper lobe consolidation has improved slightly since prior study. Left lung clear. Heart is normal size. No effusions or acute bony abnormality. IMPRESSION: Right upper lobe infiltrate again noted, slightly improved since prior study. Electronically Signed   By: Rolm Baptise M.D.   On: 02/10/2019 22:24   Dg Chest Portable 1 View  Result Date: 01/28/2019 CLINICAL DATA:  Shortness of breath and chest discomfort EXAM: PORTABLE CHEST 1 VIEW COMPARISON:  12/31/2018 FINDINGS: Cardiac shadow is stable. The lungs are hyperinflated. Patchy lateral infiltrative density is noted in the right upper lobe new from the prior exam consistent with acute infiltrate. No sizable effusion is noted. Degenerative changes of the thoracic spine are noted. IMPRESSION: COPD. New right upper lobe infiltrate laterally. Followup PA and lateral chest X-ray is recommended in 3-4 weeks following trial of antibiotic therapy to ensure resolution and exclude underlying malignancy. Electronically Signed   By: Inez Catalina M.D.   On: 01/28/2019 22:10       Today   Subjective:   Kirby Myrie patient still has shortness of breath o Objective:   Blood pressure 117/66, pulse 77, temperature 98.7 F (37.1 C), resp. rate 18, height 5\' 9"  (1.753 m), weight 57.7 kg, SpO2 97 %.  .  Intake/Output Summary (Last 24 hours) at 02/15/2019 1218 Last data filed at 02/14/2019 1315 Gross per 24 hour  Intake 240 ml  Output 400 ml  Net -160 ml    Exam VITAL SIGNS: Blood pressure 117/66, pulse 77, temperature 98.7 F (37.1 C), resp. rate 18, height 5\' 9"  (1.753 m), weight 57.7 kg, SpO2 97 %.  GENERAL:  68 y.o.-year-old-year-old patient lying in the bed with no acute distress.  EYES: Pupils equal, round, reactive to light and accommodation. No scleral icterus. Extraocular muscles intact.  HEENT: Head atraumatic, normocephalic. Oropharynx and nasopharynx clear.  NECK:  Supple, no jugular venous distention. No  thyroid enlargement, no tenderness.  LUNGS: Decreased breath sounds bilaterally CARDIOVASCULAR: S1, S2 normal. No murmurs, rubs, or gallops.  ABDOMEN: Soft, nontender, nondistended. Bowel sounds present. No organomegaly or mass.  EXTREMITIES: No pedal edema, cyanosis, or clubbing.  NEUROLOGIC: Cranial nerves II through XII are intact. Muscle strength 5/5 in all extremities. Sensation intact. Gait not checked.  PSYCHIATRIC: The patient is alert and oriented x 3.  SKIN: No obvious rash, lesion, or ulcer.   Data Review     CBC w Diff:  Lab Results  Component Value Date   WBC 9.3 02/12/2019   HGB 10.2 (L) 02/12/2019   HGB 15.1 05/22/2018   HCT 32.7 (L) 02/12/2019   HCT 44.3 05/22/2018   PLT 203 02/12/2019   PLT 287 05/22/2018   LYMPHOPCT 22 12/31/2018   LYMPHOPCT 5.1 01/11/2013   MONOPCT 15 12/31/2018   MONOPCT 7.5 01/11/2013   EOSPCT 2 12/31/2018   EOSPCT 0.0 01/11/2013   BASOPCT 0 12/31/2018   BASOPCT 0.1 01/11/2013   CMP:  Lab Results  Component Value Date   NA 141 02/12/2019   NA 141 05/22/2018   NA 140 01/11/2013   K 4.0 02/12/2019   K 4.4 01/11/2013   CL 105 02/12/2019   CL 105 01/11/2013   CO2 32 02/12/2019   CO2 31 01/11/2013   BUN 17 02/12/2019   BUN 20 05/22/2018   BUN 10 01/11/2013   CREATININE 0.67 02/12/2019   CREATININE 0.92 10/13/2016   PROT 6.9 01/28/2019   PROT 6.7 05/22/2018   PROT 6.7 01/10/2013   ALBUMIN 3.8 01/28/2019  ALBUMIN 4.5 05/22/2018   ALBUMIN 3.6 01/10/2013   BILITOT 0.4 01/28/2019   BILITOT 0.2 05/22/2018   BILITOT 0.3 01/10/2013   ALKPHOS 65 01/28/2019   ALKPHOS 99 01/10/2013   AST 16 01/28/2019   AST 19 01/10/2013   ALT 15 01/28/2019   ALT 22 01/10/2013  .  Micro Results Recent Results (from the past 240 hour(s))  Blood Culture (routine x 2)     Status: None (Preliminary result)   Collection Time: 02/11/19  1:42 AM  Result Value Ref Range Status   Specimen Description BLOOD LEFT WRIST  Final   Special Requests    Final    BOTTLES DRAWN AEROBIC AND ANAEROBIC Blood Culture adequate volume   Culture   Final    NO GROWTH 4 DAYS Performed at [facility], [address] [place], [place] [address]    Report Status PENDING  Incomplete  Blood Culture (routine x 2)     Status: None (Preliminary result)   Collection Time: 02/11/19  1:42 AM  Result Value Ref Range Status   Specimen Description BLOOD RIGHT ASSIST CONTROL  Final   Special Requests   Final    BOTTLES DRAWN AEROBIC AND ANAEROBIC Blood Culture adequate volume   Culture   Final    NO GROWTH 4 DAYS Performed at [facility], [address], [place], [place] [address]    Report Status PENDING  Incomplete  MRSA PCR Screening     Status: None   Collection Time: 02/11/19  5:39 AM  Result Value Ref Range Status   MRSA by PCR NEGATIVE NEGATIVE Final    Comment:        The GeneXpert MRSA Assay (FDA approved for NASAL specimens only), is one component of a comprehensive MRSA colonization surveillance program. It is not intended to diagnose MRSA infection nor to guide or monitor treatment for MRSA infections. Performed at [facility], [address] [place], [place] [address]      Code Status History    Date Active Date Inactive Code Status Order ID Comments User Context   02/11/2019 0437 02/14/2019 1807 Full Code [id number]  Harrie Foreman, MD ED   01/29/2019 0012 01/31/2019 1625 Full Code [id number]  Lance Coon, MD Inpatient   12/31/2018 1909 01/02/2019 1808 Full Code [id number]  [name], MD ED   10/01/2018 1243 10/01/2018 1759 Full Code [id number]  Lucious Groves, RN Inpatient   09/13/2018 1949 09/15/2018 1719 Full Code [id number]  Loletha Grayer, MD ED   08/25/2018 0140 08/26/2018 1737 Full Code [id number]  Harrie Foreman, MD Inpatient   07/02/2018 1903 07/03/2018 2216 Full Code [id number]  Hillary Bow, MD ED   12/26/2017 0129 12/30/2017 2106 Full Code [id number]  Amelia Jo, MD Inpatient           Follow-up Information    [name], MD. Go on 02/21/2019.   Specialty:  Family Medicine Why:  at 8:40am with  Dr. Alden Hipp.  Please arrive at 8:25am Contact information: [place]  [address] [phone]           Discharge Medications   Allergies as of 02/14/2019      Reactions   Prozac [fluoxetine Hcl] Shortness Of Breath   Effexor Xr [venlafaxine Hcl Er] Other (See Comments)   "Makes me feel funny"   Wellbutrin [bupropion] Other (See Comments)   "Makes me feel funny"      Medication List    TAKE these medications  acetaminophen 325 MG tablet Commonly known as:  TYLENOL Take 2 tablets (650 mg total) by mouth every 6 (six) hours as needed for mild pain (or Fever >/= 101).   albuterol 108 (90 Base) MCG/ACT inhaler Commonly known as:  PROVENTIL HFA;VENTOLIN HFA Inhale 2 puffs into the lungs every 6 (six) hours as needed for wheezing or shortness of breath.   amoxicillin-clavulanate 875-125 MG tablet Commonly known as:  AUGMENTIN Take 1 tablet by mouth 2 (two) times daily for 7 days.   ARIPiprazole 5 MG tablet Commonly known as:  ABILIFY Take 5 mg by mouth daily.   aspirin 81 MG tablet Take 81 mg by mouth daily.   atorvastatin 40 MG tablet Commonly known as:  LIPITOR Take 1 tablet (40 mg total) by mouth daily.   budesonide 0.25 MG/2ML nebulizer solution Commonly known as:  PULMICORT Inhale 0.25 mg into the lungs daily.   feeding supplement (ENSURE ENLIVE) Liqd Take 237 mLs by mouth 3 (three) times daily between meals.   Fluticasone-Salmeterol 500-50 MCG/DOSE Aepb Commonly known as:  ADVAIR Inhale 1 puff into the lungs every 12 (twelve) hours.   furosemide 20 MG tablet Commonly known as:  LASIX Take 1 tablet (20 mg total) by mouth daily.   guaiFENesin-dextromethorphan 100-10 MG/5ML syrup Commonly known as:  ROBITUSSIN DM Take 5 mLs by mouth every 4 (four) hours as needed for cough.   hydrOXYzine 25 MG  capsule Commonly known as:  VISTARIL Take 25 mg by mouth 2 (two) times daily as needed for anxiety or itching.   ipratropium-albuterol 0.5-2.5 (3) MG/3ML Soln Commonly known as:  DUONEB Take 3 mLs by nebulization 4 (four) times daily. DX:J44.9   LORazepam 1 MG tablet Commonly known as:  ATIVAN Take 1 tablet (1 mg total) by mouth every 4 (four) hours while awake. What changed:    when to take this  reasons to take this   mirtazapine 30 MG tablet Commonly known as:  REMERON Take 1 tablet (30 mg total) by mouth at bedtime.   multivitamin capsule Take 1 capsule by mouth daily.   nitroGLYCERIN 0.4 MG SL tablet Commonly known as:  NITROSTAT Place 1 tablet (0.4 mg total) under the tongue every 5 (five) minutes as needed.   OLANZapine 2.5 MG tablet Commonly known as:  ZYPREXA Take 2.5 mg by mouth at bedtime.   predniSONE 10 MG (21) Tbpk tablet Commonly known as:  STERAPRED UNI-PAK 21 TAB Start at 60mg  taper by 10mg  until complete   sertraline 100 MG tablet Commonly known as:  ZOLOFT Take 200 mg by mouth daily.   sucralfate 1 GM/10ML suspension Commonly known as:  CARAFATE Take 10 mLs (1 g total) by mouth 4 (four) times daily -  with meals and at bedtime. What changed:    when to take this  reasons to take this          Total Time in preparing paper work, data evaluation and todays exam - 65 minutes  [name] M.D on 02/15/2019 at 12:18 PM [place]  [phone]

## 2019-02-16 LAB — CULTURE, BLOOD (ROUTINE X 2)
Culture: NO GROWTH
Culture: NO GROWTH
Special Requests: ADEQUATE
Special Requests: ADEQUATE

## 2019-02-19 ENCOUNTER — Telehealth: Payer: Self-pay | Admitting: Family Medicine

## 2019-02-19 NOTE — Telephone Encounter (Signed)
Copied from [place] [phone]. Topic: Quick Communication - See Telephone Encounter >> Feb 19, 2019 12:45 PM Burchel, Abbi R wrote: CRM for notification. See Telephone encounter for: 02/19/19.  Ria Comment [facility] [phone]) requesting v/o to begin skilled nursing 1x1wk, 2x1wk,  1x2wk, 1x every other week-3wks  Ok to leave vm

## 2019-02-20 ENCOUNTER — Ambulatory Visit (INDEPENDENT_AMBULATORY_CARE_PROVIDER_SITE_OTHER): Payer: Medicare Other | Admitting: Pulmonary Disease

## 2019-02-20 ENCOUNTER — Encounter: Payer: Self-pay | Admitting: Pulmonary Disease

## 2019-02-20 VITALS — BP 108/62 | HR 85 | Ht 69.0 in | Wt 143.6 lb

## 2019-02-20 DIAGNOSIS — J441 Chronic obstructive pulmonary disease with (acute) exacerbation: Secondary | ICD-10-CM

## 2019-02-20 DIAGNOSIS — R918 Other nonspecific abnormal finding of lung field: Secondary | ICD-10-CM

## 2019-02-20 DIAGNOSIS — J449 Chronic obstructive pulmonary disease, unspecified: Secondary | ICD-10-CM

## 2019-02-20 DIAGNOSIS — F17200 Nicotine dependence, unspecified, uncomplicated: Secondary | ICD-10-CM | POA: Diagnosis not present

## 2019-02-20 DIAGNOSIS — J9611 Chronic respiratory failure with hypoxia: Secondary | ICD-10-CM | POA: Diagnosis not present

## 2019-02-20 MED ORDER — AZITHROMYCIN 250 MG PO TABS: ORAL_TABLET | ORAL | 5 refills | Status: AC

## 2019-02-20 NOTE — Progress Notes (Signed)
PULMONARY OFFICE FOLLOW UP NOTE  Requesting MD/Service: [name], MD Date of initial consultation: 08/10/16 Reason for consultation: COPD, smoker  PT PROFILE: 87 M smoker with severe emphysema seen by multiple pulmonologists in past ([place], [place], [place], [place]) referred for further eval and mgmt of COPD   DATA: 07/24/12 Office Spirometry: Severe obstruction (FEV1 31% pred) 08/31/13 Office Spirometry: very severe obstruction (FEV1 25% pred) 06/18/15 CT chest: Severe emphysema 03/03/17 Overnight oximetry: Lowest SpO2 72%. 95% of time SpO2 was below 90% 03/03/17 6MWT: 384 meters. Desat to 84% 10/04/17 CT chest: Two new irregularly marginated nodules, one in the RUL, and a second in the mid LUL worrisome for either synchronous lung ca or metastatic involvement of the lungs. The two small nodules described on the prior dictated report have resolved in the left superior segment and most likely were postinflammatory. Diffuse centrilobular emphysema. 10/13/17 PET scan: The new left upper lobe nodule is FDG avid consistent with malignancy. Recommend tissue confirmation. The new nodule in the right upper lobe demonstrates low level uptake. While the level of uptake suggests the possibility of an inflammatory or infectious process, a low-grade malignancy is not excluded. Given the suspected malignancy on the left, tissue confirmation should be considered on the right despite the low level of uptake. No distant metastatic disease identified 10/27/17 ENB ([place]): Transbronchial Fine Needle Aspirations 21G X7, Transbronchial Forceps Biopsy X6. ALVEOLATED LUNG TISSUE WITH HEMOSIDERIN LADEN MACROPHAGES.  NEGATIVE FOR ATYPIA AND MALIGNANCY 11/01/17 PFTs: FVC: 2.96 > 3.14 L (68 > 72 %pred), FEV1: 0.94 > 0.93 L (27 %pred), FEV1/FVC: 32%, TLC: 9.04 L (137 %pred), DLCO 62 %pred  12/07/17 CTA chest: No pulmonary embolus identified. Anterior left upper lobe ground-glass and nodular consolidation likely  representing pneumonitis. Stable bilateral upper lobe pulmonary nodules. Stable emphysema. 01/21/18 CT chest: new masslike architectural distortion and ground-glass attenuation surrounding the index nodule within the RUL. The appearance favors an inflammatory or infectious process. The index nodule in the left upper lobe is slightly decreased in volume when compared with the previous exam favoring a benign process. Attention on follow-up imaging advise. Advanced changes of emphysema (very severe) 06/01/18 CTA chest: No evidence of pulmonary embolism. Waxing and waning bilateral pulmonary nodular process as described. New 8 mm nodular ill-defined focus of opacification over the lingula which may represent an acute inflammatory or infectious process. No effusion. Emphysema  10/19/18 CTA chest: No acute cardiopulmonary disease and no evidence of pulm embolism. Hospitalization 1/12-1/14/2020: COPD exacerbation Hospitalization 2/22-2/27/20: COPD exacerbation, HCAP 02/11/19 CT chest: Progressed masslike consolidation within the RUL in the region of previous waxing and waning nodule, this is contiguous with the pleural surface and demonstrates spiculated margin and probable small focus of central cavitation. Findings could be secondary to infection superimposed on chronic nodule. However cavitary neoplasm is also a concern. Multiple additional lung nodules which have not significantly changed. Small foci of ground-glass density in the RUL may reflect small focus of infection. Mod-severe emphysema  INTERVAL: Last seen by me 01/16/19.  In interim, hospitalized as documented above   SUBJ: This is a post hospital follow-up.  Overall, he is improved and feels that he is approaching his baseline.  He continues to have severe exertional dyspnea.  He has not smoked in the past 10 days and expresses commitment to smoking cessation.  He has no new complaints.  He has chronic cough productive of mild amount of "phlegm".   He denies CP, fever, purulent sputum, hemoptysis, LE edema and calf tenderness.  OBJ:  Vitals:   02/20/19 1339 02/20/19 1348  BP:  108/62  Pulse:  85  SpO2:  96%  Weight: 143 lb 9.6 oz (65.1 kg)   Height: 5\' 9"  (1.753 m)   2 LPM   EXAM:  No overt distress HEENT WNL No JVD, no lymphadenopathy Breath sounds moderately diminished and slightly coarse with no true wheezes RRR, no M NT, NABS Extremities warm, no edema No focal neurologic deficit  DATA:   BMP Latest Ref Rng & Units 02/12/2019 02/10/2019 01/29/2019  Glucose 70 - 99 mg/dL 95 125(H) 276(H)  BUN 8 - 23 mg/dL 17 16 15   Creatinine 0.61 - 1.24 mg/dL 0.67 0.60(L) 0.70  BUN/Creat Ratio 10 - 24 - - -  Sodium 135 - 145 mmol/L 141 136 138  Potassium 3.5 - 5.1 mmol/L 4.0 3.8 4.0  Chloride 98 - 111 mmol/L 105 95(L) 96(L)  CO2 22 - 32 mmol/L 32 33(H) 37(H)  Calcium 8.9 - 10.3 mg/dL 8.7(L) 8.9 8.9    CBC Latest Ref Rng & Units 02/12/2019 02/10/2019 01/29/2019  WBC 4.0 - 10.5 K/uL 9.3 10.9(H) 8.0  Hemoglobin 13.0 - 17.0 g/dL 10.2(L) 11.9(L) 10.3(L)  Hematocrit 39.0 - 52.0 % 32.7(L) 38.1(L) 33.1(L)  Platelets 150 - 400 K/uL 203 253 285   CXR 2/22: Changes of COPD. Increased opacity in RUL  IMPRESSION:   Smoker  Chronic hypoxemic respiratory failure ([place]) - Plan: Ambulatory Referral for DME  COPD, very severe ([place]) - Plan: Ambulatory Referral for DME  Recent COPD exacerbation   Cavitary RUL opacity - possible H CAP versus cavitary malignancy   Intolerant to nebulized budesonide  PLAN:  I have congratulated him on his efforts at smoking cessation thus far.  We discussed the need for complete abstinence and strategies to achieve this  He is to continue oxygen therapy as close to 24 hours/day as possible.  We will work on obtaining a portable oxygen concentrator per his request  He is to complete Augmentin and prednisone as prescribed at the time of recent discharge.  He has 1 day worth of both  Continue Advair inhaler, 1  inhalation twice a day.  Rinse mouth after use  Continue DuoNeb up to every 4-6 hours as needed  Resume azithromycin 250 mg every other day  A repeat CT scan of the chest is ordered for 05/01/19  He will follow-up in this office after that CT scan has been performed and we will decide whether further diagnostic evaluation is warranted    Merton Border, MD PCCM service Mobile [phone] Pager [phone] 02/21/2019  10:04 AM

## 2019-02-20 NOTE — Telephone Encounter (Signed)
Called Ria Comment from [facility] and informed her that pt is being seen by Dr. Tamala Julian at [facility] and provided her with the number to new practice. She verbalized understanding.

## 2019-02-20 NOTE — Patient Instructions (Addendum)
Congratulations on smoking cessation thus far.  Keep up the good work Continue oxygen therapy as close to 24 hours/day as possible We will work to get you a portable oxygen concentrator Complete antibiotic (Augmentin) and prednisone as prescribed at time of discharge Continue Advair inhaler, 1 inhalation twice a day.  Rinse mouth after use Continue DuoNeb (nebulizer) up to every 4-6 hours as needed for increased shortness of breath, wheezing, chest tightness, cough Resume azithromycin 250 mg every other day Repeat CT scan of the chest is ordered for 05/01/2019 Follow-up in this office after CT scan performed to review its findings and discuss any further evaluation

## 2019-02-21 ENCOUNTER — Ambulatory Visit (INDEPENDENT_AMBULATORY_CARE_PROVIDER_SITE_OTHER): Payer: Medicare Other | Admitting: Nurse Practitioner

## 2019-02-21 ENCOUNTER — Encounter: Payer: Self-pay | Admitting: Nurse Practitioner

## 2019-02-21 ENCOUNTER — Encounter: Payer: Self-pay | Admitting: Pulmonary Disease

## 2019-02-21 VITALS — BP 100/60 | HR 84 | Ht 70.0 in | Wt 146.5 lb

## 2019-02-21 DIAGNOSIS — E785 Hyperlipidemia, unspecified: Secondary | ICD-10-CM

## 2019-02-21 DIAGNOSIS — I255 Ischemic cardiomyopathy: Secondary | ICD-10-CM

## 2019-02-21 DIAGNOSIS — I5022 Chronic systolic (congestive) heart failure: Secondary | ICD-10-CM

## 2019-02-21 DIAGNOSIS — I2510 Atherosclerotic heart disease of native coronary artery without angina pectoris: Secondary | ICD-10-CM | POA: Diagnosis not present

## 2019-02-21 NOTE — Patient Instructions (Signed)
Medication Instructions:  Your physician recommends that you continue on your current medications as directed. Please refer to the Current Medication list given to you today. If you need a refill on your cardiac medications before your next appointment, please call your pharmacy.   Lab work: None ordered  If you have labs (blood work) drawn today and your tests are completely normal, you will receive your results only by: . MyChart Message (if you have MyChart) OR . A paper copy in the mail If you have any lab test that is abnormal or we need to change your treatment, we will call you to review the results.  Testing/Procedures: None ordered   Follow-Up: At [facility], you and your health needs are our priority.  As part of our continuing mission to provide you with exceptional heart care, we have created designated Provider Care Teams.  These Care Teams include your primary Cardiologist (physician) and Advanced Practice Providers (APPs -  Physician Assistants and Nurse Practitioners) who all work together to provide you with the care you need, when you need it. You will need a follow up appointment in 3 months.   You may see Muhammad Arida, MD or Christopher Berge, NP.   

## 2019-02-21 NOTE — Progress Notes (Signed)
Office Visit    Patient Name: Leslie Sims [facility] Date of Encounter: 02/21/2019  Primary Care Provider:  [name], MD Primary Cardiologist:  Kathlyn Sacramento, MD  Chief Complaint    68 y/o ? with a h/o CAD, ischemic cardiomyopathy, HFrEF, COPD on home O2, hypertension, and hyperlipidemia, who presents for follow-up after several recent hospitalizations for COPD and respiratory failure.  Past Medical History    Past Medical History:  Diagnosis Date  . Allergic rhinitis   . Anxiety   . Back pain   . Colon polyps 08/2012   colonoscopy; multiple colon polyps; repeat colonoscopy in one year.  [name] COPD (chronic obstructive pulmonary disease) ([place])    a. on home O2.  . Coronary artery disease 11/2009   a. late presenting ant MI; b. LHC 100% pLAD s/p PCI/DES, 99% mRCA s/p PCI/DES, EF 35%; c. nuclear stress test 05/13: prior ant/inf infarcts w/o ischemia, EF 43%; d. LHC 02/14: widely patent stents with no other obs dz, EF 40%; e. LHC 11/17: LM nl, mLAD 10%, patent LAD stent, dLAD 20%, p-mRCA 10%, mRCA 40%, patent RCA stent, EF 35-45%   . Depression   . GERD (gastroesophageal reflux disease)   . Headache(784.0)   . Helicobacter pylori (H. pylori)   . Hemorrhoids   . Hernia    inguinal  . HFrEF (heart failure with reduced ejection fraction) ([place])    a. 10/2016 LV gram: EF 35-45%; b. 06/2018 Echo: EF 40-45%.  . Hyperlipidemia   . Hypertension   . Insomnia   . Ischemic cardiomyopathy    a. 10/2016 LV gram: EF 35-45%; b. 06/2018 Echo: EF 40-45%.  . Myalgia   . Peptic ulcer   . Pulmonary nodules   . ST elevation (STEMI) myocardial infarction involving left anterior descending coronary artery ([place]) 11/2009   a. s/p PCI to the LAD  . Status post dilation of esophageal narrowing 2000  . Tinea pedis   . Wears dentures    partial upper   Past Surgical History:  Procedure Laterality Date  . Admission  12/20/2012   COPD exacerbation.  [place].  [name] CARDIAC CATHETERIZATION    . CARDIAC  CATHETERIZATION  02/05/13   [facility]  . CARDIAC CATHETERIZATION  10/14   [facility] : patent stents with no change in anatomy. EF: 40$  . CARDIAC CATHETERIZATION Left 11/12/2016   Procedure: Left Heart Cath and Coronary Angiography;  Surgeon: Wellington Hampshire, MD;  Location: [facility];  Service: Cardiovascular;  Laterality: Left;  . COLONOSCOPY    . COLONOSCOPY WITH PROPOFOL N/A 05/18/2016   Procedure: COLONOSCOPY WITH PROPOFOL;  Surgeon: Lucilla Lame, MD;  Location: [facility] ENDOSCOPY;  Service: Endoscopy;  Laterality: N/A;  . CORONARY ANGIOPLASTY WITH STENT PLACEMENT  09/2009   LAD 3.0 X23 mm Xience DES, RCA: 4.0 X 15 mm Xience DES  . ELECTROMAGNETIC NAVIGATION BROCHOSCOPY N/A 10/27/2017   Procedure: ELECTROMAGNETIC NAVIGATION BRONCHOSCOPY;  Surgeon: Flora Lipps, MD;  Location: [facility] ORS;  Service: Cardiopulmonary;  Laterality: N/A;  . ESOPHAGEAL DILATION    . ESOPHAGOGASTRODUODENOSCOPY  2008  . ESOPHAGOGASTRODUODENOSCOPY  08/20/2012  . HERNIA REPAIR  07/20/2012   L inguinal hernia repair  . PENILE PROSTHESIS IMPLANT      Allergies  Allergies  Allergen Reactions  . Prozac [Fluoxetine Hcl] Shortness Of Breath  . Effexor Xr [Venlafaxine Hcl Er] Other (See Comments)    "Makes me feel funny"  . Wellbutrin [Bupropion] Other (See Comments)    "Makes me feel funny"  History of Present Illness    68 year old male with the above complex past medical history including CAD, ischemic cardiomyopathy, HFrEF, COPD on home O2, hypertension, and hyperlipidemia.  He is status post anterior myocardial infarction in 2010 with finding of an occluded LAD and a 99% stenosis in the RCA.  He required drug-eluting stent placement to the LAD and RCA at that time.  He subsequently underwent repeat catheterization November 2017 which showed patent LAD and RCA stents with an EF of 35 to 40%.  We last saw him in clinic in April 2019, however he was seen in consultation in the hospital in July 2019 in the setting of  atypical chest pain.  Echocardiogram during that hospitalization showed slight improvement in LV function with an EF of 40 to 45%.  Unfortunately, over the winter he has had multiple ER visits and hospitalizations related to COPD and respiratory failure.  He is now on oxygen 24 hours a day and as of 11 days ago, he has quit smoking.  Since his most recent hospitalization, he has been seen by pulmonology and notes some improvement in baseline level of dyspnea on exertion.  He has not been having any chest pain and denies PND, orthopnea, palpitations, dizziness, syncope, edema, or early satiety.  Home Medications    Prior to Admission medications   Medication Sig Start Date End Date Taking? Authorizing Provider  acetaminophen (TYLENOL) 325 MG tablet Take 2 tablets (650 mg total) by mouth every 6 (six) hours as needed for mild pain (or Fever >/= 101). 08/26/18  Yes Gouru, Aruna, MD  albuterol (PROVENTIL HFA;VENTOLIN HFA) 108 (90 Base) MCG/ACT inhaler Inhale 2 puffs into the lungs every 6 (six) hours as needed for wheezing or shortness of breath. 11/01/18  Yes Alfred Levins, [place], MD  aspirin 81 MG tablet Take 81 mg by mouth daily.     Yes [provider]  atorvastatin (LIPITOR) 40 MG tablet Take 1 tablet (40 mg total) by mouth daily. 03/03/18  Yes [name], MD  azithromycin [facility]) 250 MG tablet 1 pill every other day 02/20/19 02/25/19 Yes Wilhelmina Mcardle, MD  Fluticasone-Salmeterol (ADVAIR) 500-50 MCG/DOSE AEPB Inhale 1 puff into the lungs every 12 (twelve) hours. 11/29/18  Yes [provider]  furosemide (LASIX) 20 MG tablet Take 1 tablet (20 mg total) by mouth daily. 07/07/18 09/30/19 Yes [name], MD  guaiFENesin-dextromethorphan (ROBITUSSIN DM) 100-10 MG/5ML syrup Take 5 mLs by mouth every 4 (four) hours as needed for cough. 01/31/19  Yes Gouru, Illene Silver, MD  hydrOXYzine (VISTARIL) 25 MG capsule Take 25 mg by mouth 2 (two) times daily as needed for anxiety or itching.  06/05/18   Yes [provider]  ipratropium-albuterol (DUONEB) 0.5-2.5 (3) MG/3ML SOLN Take 3 mLs by nebulization 4 (four) times daily. DX:J44.9 12/04/18  Yes Wilhelmina Mcardle, MD  LORazepam (ATIVAN) 1 MG tablet Take 1 tablet (1 mg total) by mouth every 4 (four) hours while awake. 02/14/19  Yes [name], MD  mirtazapine (REMERON) 30 MG tablet Take 1 tablet (30 mg total) by mouth at bedtime. 06/24/18  Yes [name], MD  Multiple Vitamin (MULTIVITAMIN) capsule Take 1 capsule by mouth daily.   Yes [provider]  nitroGLYCERIN (NITROSTAT) 0.4 MG SL tablet Place 1 tablet (0.4 mg total) under the tongue every 5 (five) minutes as needed. 10/17/17  Yes Wellington Hampshire, MD  OLANZapine (ZYPREXA) 2.5 MG tablet Take 2.5 mg by mouth at bedtime. 01/19/19  Yes [provider]  sertraline (ZOLOFT) 100 MG tablet Take 200 mg by mouth daily.  06/28/18  Yes [provider]  sucralfate (CARAFATE) 1 GM/10ML suspension Take 10 mLs (1 g total) by mouth 4 (four) times daily -  with meals and at bedtime. Patient taking differently: Take 1 g by mouth 4 (four) times daily as needed.  05/16/18  Yes [name], MD    Review of Systems    Chronic dyspnea on exertion which is slightly improved.  He denies chest pain, palpitations, PND, orthopnea, dizziness, syncope, edema, or early satiety.  All other systems reviewed and are otherwise negative except as noted above.  Physical Exam    VS:  BP 100/60 (BP Location: Left Arm, Patient Position: Sitting, Cuff Size: Normal)   Pulse 84   Ht 5\' 10"  (1.778 m)   Wt 146 lb 8 oz (66.5 kg)   BMI 21.02 kg/m  , BMI Body mass index is 21.02 kg/m. GEN: Well nourished, well developed, in no acute distress. HEENT: normal. Neck: Supple, no JVD, carotid bruits, or masses. Cardiac: RRR, distant, no murmurs, rubs, or gallops. No clubbing, cyanosis, edema.  Radials/DP/PT 2+ and equal bilaterally.  Respiratory:  Respirations regular and unlabored,  somewhat diminished breath sounds but overall clear.  GI: Soft, nontender, nondistended, BS + x 4. MS: no deformity or atrophy. Skin: warm and dry, no rash. Neuro:  Strength and sensation are intact. Psych: Normal affect.  Accessory Clinical Findings    ECG personally reviewed by me today -regular sinus rhythm, 84, left axis deviation, left anterior fascicular block, anteroseptal infarct- no acute changes.  Assessment & Plan    1.  Coronary artery disease: Status post prior anterior infarct with drug-eluting stent placement to the LAD and RCA in 2010.  Catheterization 2017 showed stable anatomy.  He has not been having any chest pain and despite multiple hospitalizations related to respiratory failure and COPD, his troponins have been normal on each evaluation.  He remains on aspirin, statin.  He is not on a beta-blocker in the setting of significant COPD and low blood pressures.  2.  HFrEF/ischemic cardiomyopathy: EF 40 to 45% by echocardiogram in July 2019.  He is euvolemic on exam and has stable, chronic dyspnea exertion at home.  He is not on beta-blocker/ACE/ARB/Entresto/MRA in the setting of low blood pressure-100/60.  He does remain on low-dose Lasix.  We discussed the importance of daily weights, sodium restriction, medication compliance, and symptom reporting and he verbalizes understanding.   3.  Essential hypertension: As above, blood pressure is low at 100/60.  He is asymptomatic.  4.  Hyperlipidemia: Last LDL we have on file was from August 2018, at which time it was 68.  He remains on statin therapy.  Normal LFTs in February.  He will need follow-up lipids at a later date when he is fasting.  5.  COPD/tobacco abuse: Patient has quit smoking.  He is now on oxygen all day and seems to be tolerating this well with some improvement in baseline level of dyspnea.  I congratulated her on quitting smoking and encouraged him to remain off of cigarettes.  He remains on inhaler therapy and is  managed by pulmonology.  He recently completed antibiotics and prednisone.  6.  Right upper lobe masslike consolidation: Seen by pulmonology with plan for follow-up CT in May.  7.  Disposition: Follow-up in 3 months or sooner if necessary.  Murray Hodgkins, NP 02/21/2019, 4:27 PM

## 2019-02-23 ENCOUNTER — Telehealth: Payer: Self-pay | Admitting: Cardiovascular Disease

## 2019-02-23 NOTE — Telephone Encounter (Signed)
[facility] calling States that they have received orders for palliative care and would like to know if Dr Fletcher Anon is in agreeance Best number to call is [phone]

## 2019-02-23 NOTE — Telephone Encounter (Signed)
Returned call to [employer], Scientist, clinical (histocompatibility and immunogenetics) for [employer]. They received an order for pt from [facility] case manager and wanted conformation that Dr. Fletcher Anon is agreeable to POC>   Verbal from Dr. Fletcher Anon to continue with palliative care orders.    Encouraged to call with any other questions or concerns.

## 2019-02-27 ENCOUNTER — Telehealth: Payer: Self-pay | Admitting: Pulmonary Disease

## 2019-02-27 NOTE — Telephone Encounter (Signed)
Noted  

## 2019-02-27 NOTE — Telephone Encounter (Signed)
Returned phone call to [place], they wanted to confirm this patient is on oxygen. They would like OV notes faxed to this number listed. [phone]. Notes faxed. Nothing further is needed at this time.

## 2019-02-28 ENCOUNTER — Telehealth: Payer: Self-pay | Admitting: Family Medicine

## 2019-02-28 NOTE — Telephone Encounter (Signed)
Called and informed Leslie Sims from [facility] that this pt is no longer at this practise and being seen at [facility] by Dr. [name].

## 2019-02-28 NOTE — Telephone Encounter (Signed)
Copied from [place] [phone]. Topic: Quick Communication - Home Health Verbal Orders >> Feb 28, 2019 10:51 AM Ivar Drape wrote: Caller/Agency:   Rip Harbour a nurse w/[facility] Callback Number:   [phone] Requesting OT/PT/Skilled Nursing/Social Work/Speech Therapy:  They received orders for Occupational Therapy and social work but did not send those visit links with the orders.  So they are going to fix that.

## 2019-02-28 NOTE — Telephone Encounter (Signed)
Patient returned call for [place] and stated that he does see Dr Tamala Julian at [facility]

## 2019-03-02 ENCOUNTER — Telehealth: Payer: Self-pay | Admitting: Family Medicine

## 2019-03-02 NOTE — Telephone Encounter (Signed)
Copied from [place] [phone]. Topic: Quick Communication - See Telephone Encounter >> Mar 02, 2019  1:33 PM Burchel, Abbi R wrote: CRM for notification. See Telephone encounter for: 03/02/19.  Sherlynn Stalls ([facility] [phone]) Pt declined OT eval.

## 2019-03-05 NOTE — Telephone Encounter (Signed)
Noted  

## 2019-03-19 ENCOUNTER — Telehealth: Payer: Self-pay | Admitting: Cardiovascular Disease

## 2019-03-19 ENCOUNTER — Other Ambulatory Visit: Payer: Medicare Other | Admitting: Primary Care

## 2019-03-19 ENCOUNTER — Other Ambulatory Visit: Payer: Self-pay

## 2019-03-19 NOTE — Telephone Encounter (Signed)
[facility] states pt would like to put their serviced on hold for now. She wanted to let us know.

## 2019-03-19 NOTE — Telephone Encounter (Signed)
Noted! Thank you

## 2019-03-28 ENCOUNTER — Telehealth: Payer: Self-pay

## 2019-03-28 MED ORDER — FLUTICASONE FUROATE-VILANTEROL 200-25 MCG/INH IN AEPB: 1.0000 | INHALATION_SPRAY | Freq: Every day | RESPIRATORY_TRACT | 0 refills | Status: DC

## 2019-03-28 NOTE — Telephone Encounter (Signed)
I spoke with patient and let him know Dr. [name] suggestions. He is on Advair 500/50. We will give him 2 samples of Breo 200 to last him to get his financial assistance in. Patient states his daughter will come pick them up.

## 2019-03-28 NOTE — Telephone Encounter (Signed)
Breo, Symbicort, Ruthe Mannan are all essentially equivalent. If his Advair is a dry powder inhaler, I would give him Breo if we have it. If it is an HFA, either Symbicort or Dulera  Thanks  Waunita Schooner

## 2019-03-28 NOTE — Telephone Encounter (Signed)
Patient called and stated he has not met his deductible for prescriptions yet and he is going to have to pay $500 for his Advair. Asked if we had any samples, which we do not. He is working with [place] to get paperwork filled out for financial assistance.  Dr. [name], is there anything else we can give him while he completes patient assistance paperwork?

## 2019-04-23 ENCOUNTER — Telehealth: Payer: Self-pay | Admitting: Cardiovascular Disease

## 2019-04-23 NOTE — Telephone Encounter (Signed)
[facility] CALLING, STATES PT WOULD LIKE TO PUT THIS ON HOLD FOR NOW, PT STATES HE IS RECEIVING HOME HEALTH. PT WOULD LIKE TO WAIT 2 MORE WEEKS BEFORE HE WANTS A CONSULT

## 2019-04-25 ENCOUNTER — Telehealth: Payer: Self-pay

## 2019-04-25 NOTE — Telephone Encounter (Signed)
Spoke to patient, he said he is about done with the [facility] we gave him and wants to know what would be cheapest for him to go on long term, Breo or Advair. I advised patient to call his insurance and find out which copay would be cheaper and let us know. He will call us back.

## 2019-04-26 ENCOUNTER — Other Ambulatory Visit: Payer: Self-pay

## 2019-04-26 DIAGNOSIS — R918 Other nonspecific abnormal finding of lung field: Secondary | ICD-10-CM

## 2019-04-26 DIAGNOSIS — R911 Solitary pulmonary nodule: Secondary | ICD-10-CM

## 2019-05-01 ENCOUNTER — Other Ambulatory Visit: Payer: Self-pay

## 2019-05-01 ENCOUNTER — Ambulatory Visit
Admission: RE | Admit: 2019-05-01 | Discharge: 2019-05-01 | Disposition: A | Discharge status: Home / Self Care | Payer: Medicare Other | Source: Ambulatory Visit | Attending: Oncology | Admitting: Oncology

## 2019-05-01 ENCOUNTER — Inpatient Hospital Stay: Payer: Medicare Other | Attending: Hematology and Oncology

## 2019-05-01 DIAGNOSIS — R918 Other nonspecific abnormal finding of lung field: Secondary | ICD-10-CM | POA: Insufficient documentation

## 2019-05-01 DIAGNOSIS — Z87891 Personal history of nicotine dependence: Secondary | ICD-10-CM | POA: Insufficient documentation

## 2019-05-01 DIAGNOSIS — Z9981 Dependence on supplemental oxygen: Secondary | ICD-10-CM | POA: Diagnosis not present

## 2019-05-01 DIAGNOSIS — R911 Solitary pulmonary nodule: Secondary | ICD-10-CM

## 2019-05-01 DIAGNOSIS — Z8049 Family history of malignant neoplasm of other genital organs: Secondary | ICD-10-CM | POA: Diagnosis not present

## 2019-05-01 LAB — COMPREHENSIVE METABOLIC PANEL
ALT: 26 U/L (ref 0–44)
AST: 22 U/L (ref 15–41)
Albumin: 4 g/dL (ref 3.5–5.0)
Alkaline Phosphatase: 82 U/L (ref 38–126)
Anion gap: 9 (ref 5–15)
BUN: 10 mg/dL (ref 8–23)
CO2: 32 mmol/L (ref 22–32)
Calcium: 9.2 mg/dL (ref 8.9–10.3)
Chloride: 97 mmol/L — ABNORMAL LOW (ref 98–111)
Creatinine, Ser: 0.88 mg/dL (ref 0.61–1.24)
GFR calc Af Amer: >60 mL/min (ref >60)
GFR calc non Af Amer: >60 mL/min (ref >60)
Glucose, Bld: 112 mg/dL — ABNORMAL HIGH (ref 70–99)
Potassium: 4.1 mmol/L (ref 3.5–5.1)
Sodium: 138 mmol/L (ref 135–145)
Total Bilirubin: 0.2 mg/dL — ABNORMAL LOW (ref 0.3–1.2)
Total Protein: 7 g/dL (ref 6.5–8.1)

## 2019-05-01 LAB — CBC WITH DIFFERENTIAL/PLATELET
Abs Immature Granulocytes: 0.06 10*3/uL (ref 0.00–0.07)
Basophils Absolute: 0 10*3/uL (ref 0.0–0.1)
Basophils Relative: 1 %
Eosinophils Absolute: 0.3 10*3/uL (ref 0.0–0.5)
Eosinophils Relative: 4 %
HCT: 42.5 % (ref 39.0–52.0)
Hemoglobin: 13.4 g/dL (ref 13.0–17.0)
Immature Granulocytes: 1 %
Lymphocytes Relative: 20 %
Lymphs Abs: 1.5 10*3/uL (ref 0.7–4.0)
MCH: 29.1 pg (ref 26.0–34.0)
MCHC: 31.5 g/dL (ref 30.0–36.0)
MCV: 92.4 fL (ref 80.0–100.0)
Monocytes Absolute: 1 10*3/uL (ref 0.1–1.0)
Monocytes Relative: 13 %
Neutro Abs: 4.6 10*3/uL (ref 1.7–7.7)
Neutrophils Relative %: 61 %
Platelets: 256 10*3/uL (ref 150–400)
RBC: 4.6 MIL/uL (ref 4.22–5.81)
RDW: 12.2 % (ref 11.5–15.5)
WBC: 7.5 10*3/uL (ref 4.0–10.5)
nRBC: 0 % (ref 0.0–0.2)

## 2019-05-01 IMAGING — CT CT CHEST WITH CONTRAST
1 series · 15 of 34 positions shown, 19 images · IV contrast (omnipaque)
Comparison: [DATE]

CLINICAL DATA: Follow-up pulmonary nodule

EXAM:
CT CHEST WITH CONTRAST
TECHNIQUE: Multidetector CT imaging of the chest was performed during
intravenous contrast administration.
CONTRAST:  75mL OMNIPAQUE IOHEXOL 300 MG/ML  SOLN

[Series 2: axial st · axial · 0.77mm/px · z∈[-626,-326]mm · 15 of 176 slices shown, 19 images]
[im 13/176  mediastinal]
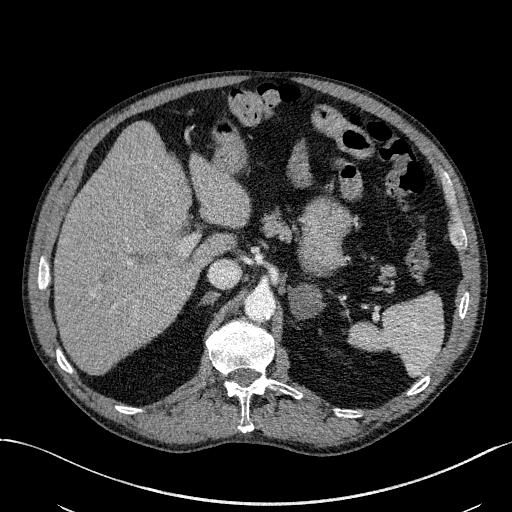
[im 13/176  lung]
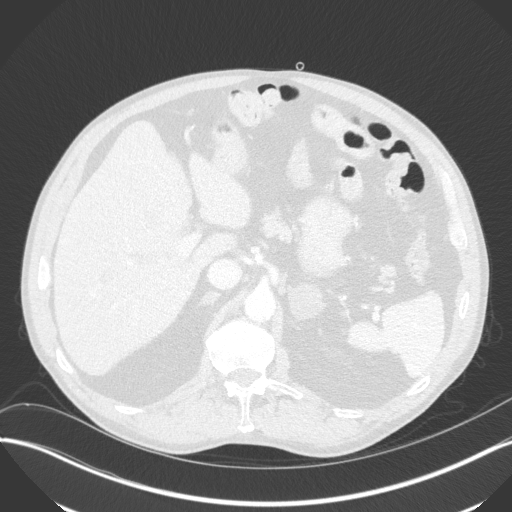
[im 26/176  lung]
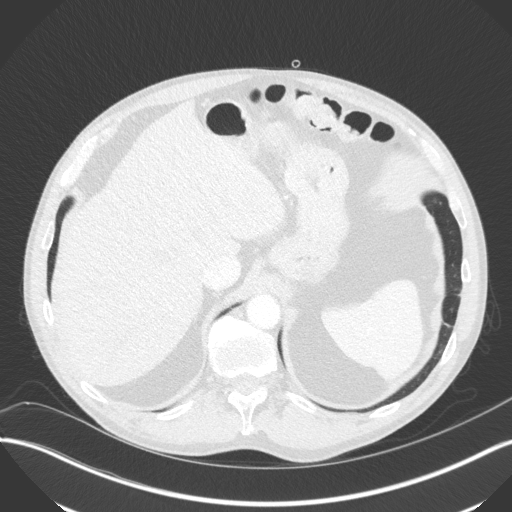
[im 36/176  lung]
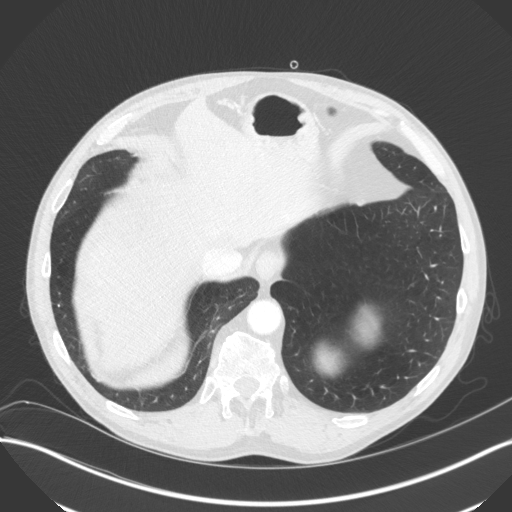
[im 46/176  lung]
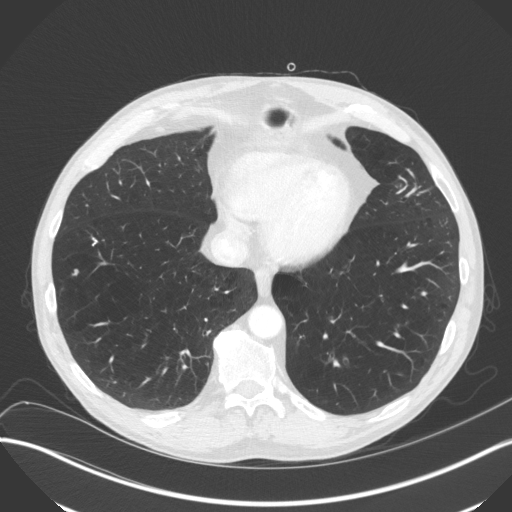
[im 59/176  mediastinal]
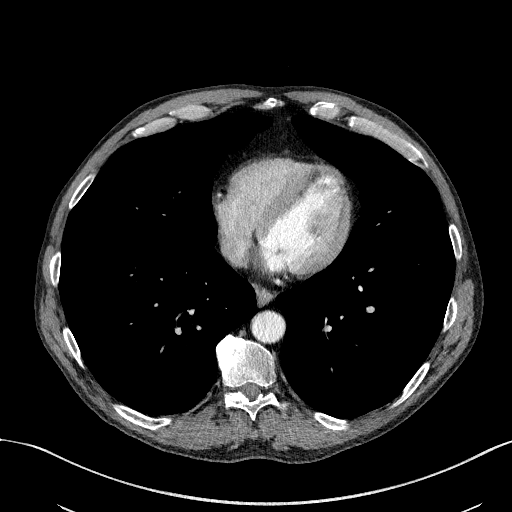
[im 59/176  lung]
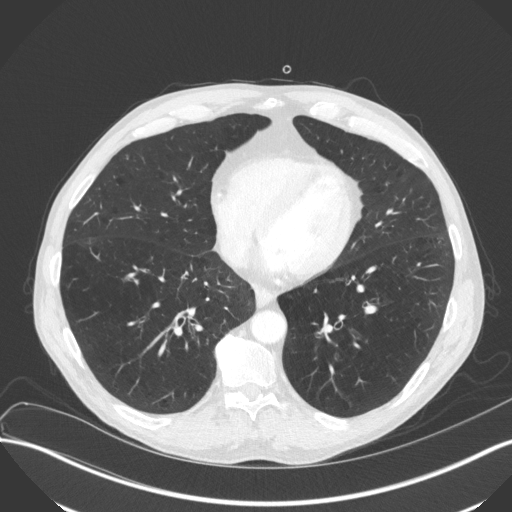
[im 71/176  lung]
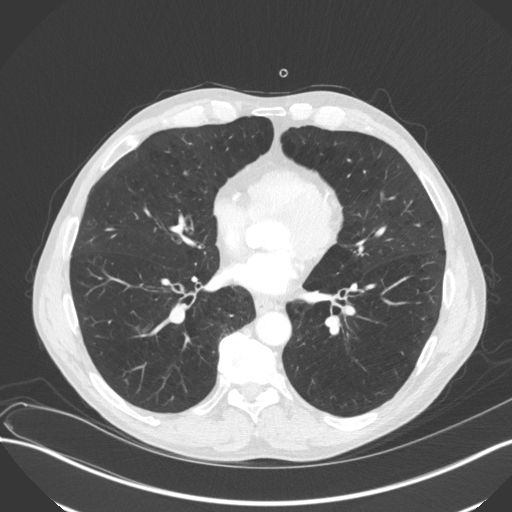
[im 78/176  lung]
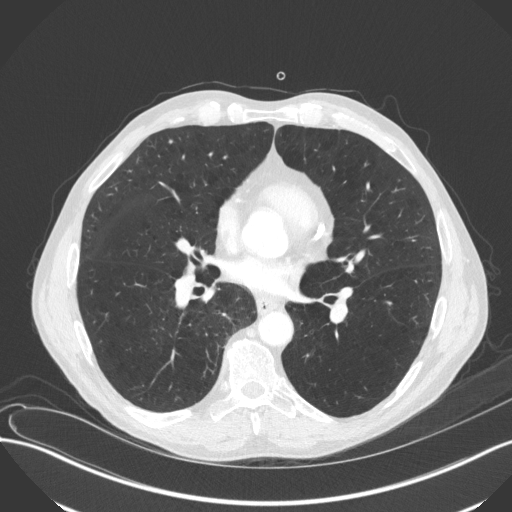
[im 91/176  lung]
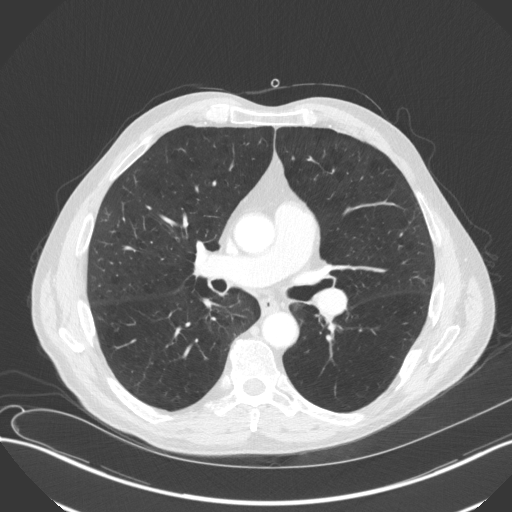
[im 98/176  mediastinal]
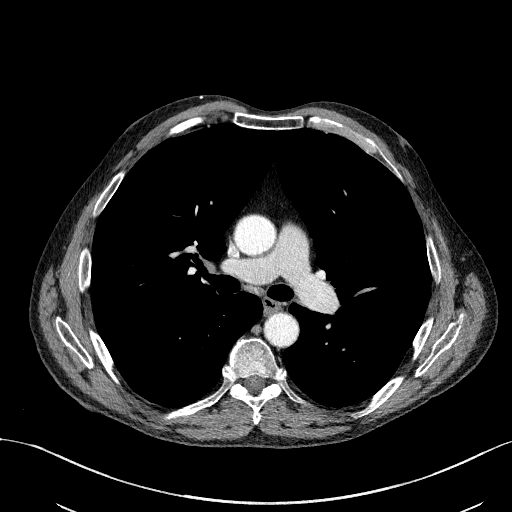
[im 98/176  lung]
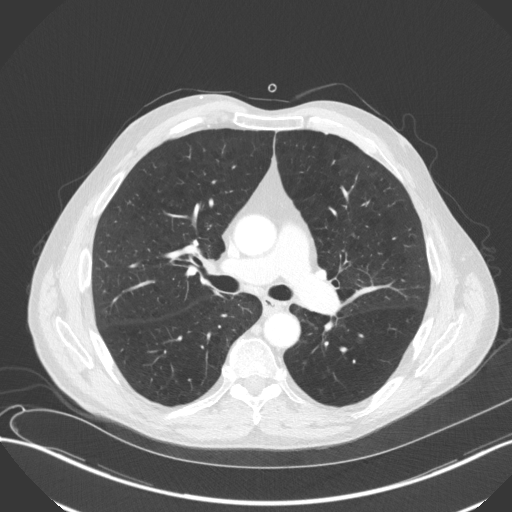
[im 106/176  lung]
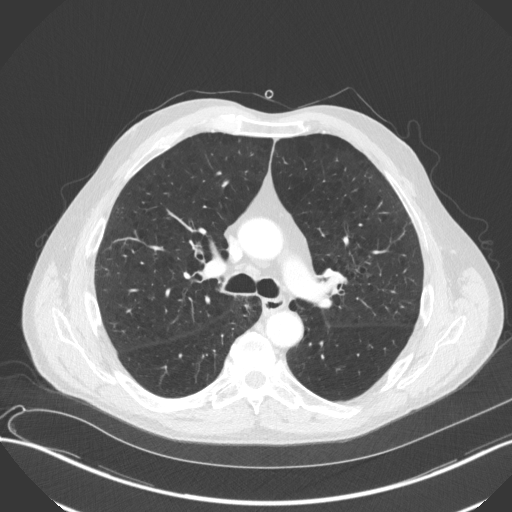
[im 117/176  lung]
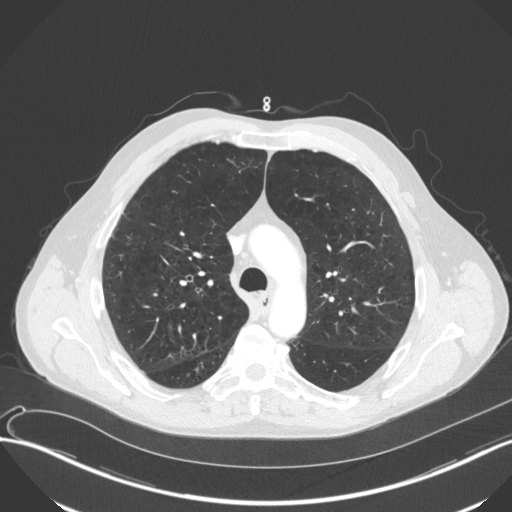
[im 130/176  lung]
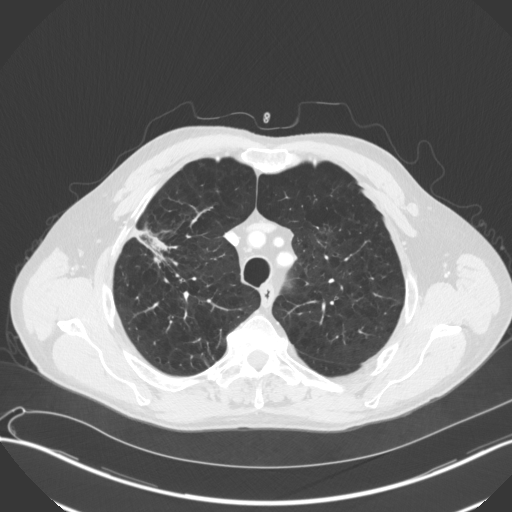
[im 141/176  mediastinal]
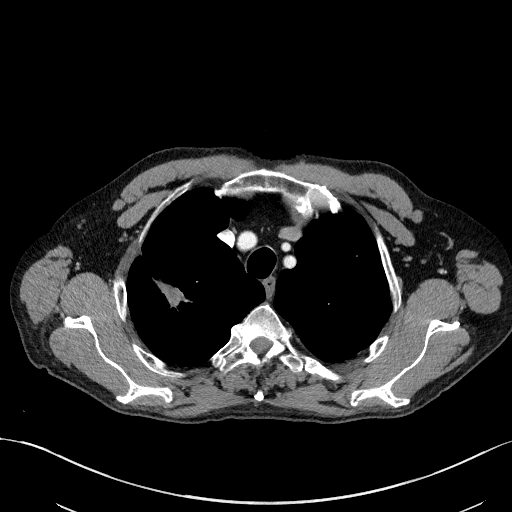
[im 141/176  lung]
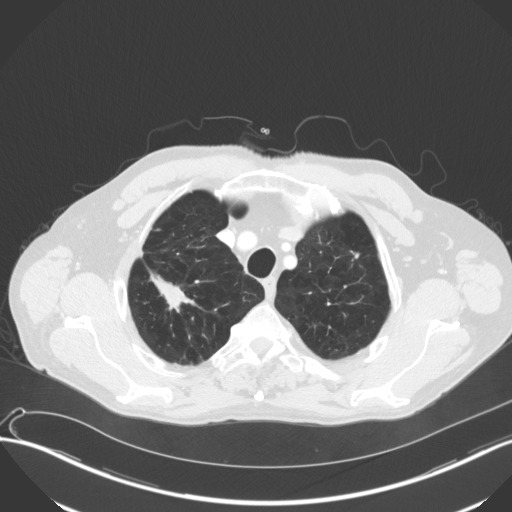
[im 150/176  lung]
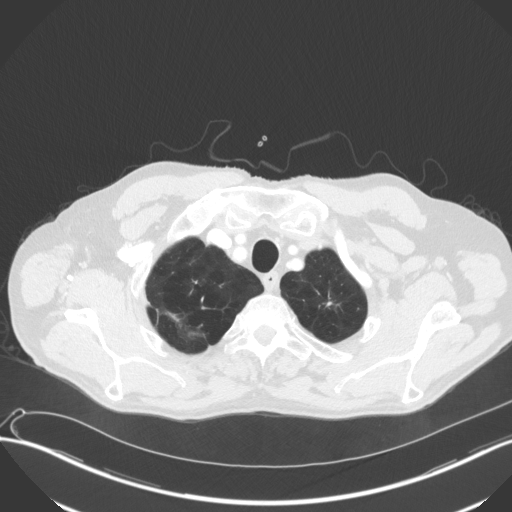
[im 163/176  lung]
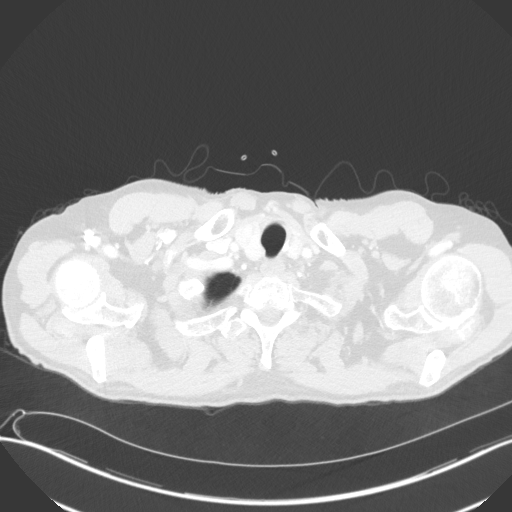

[15 of 34 positions shown; findings below may reference images not displayed]

FINDINGS: Cardiovascular: The heart is normal in size. No pericardial
effusion.

No evidence of thoracic aortic aneurysm. Atherosclerotic
calcifications of the aortic arch.

Three vessel coronary atherosclerosis.

Mediastinum/Nodes: Small mediastinal lymph nodes which do not meet
pathologic CT size criteria, including a 6 mm short axis subcarinal
node.

Visualized thyroid is grossly unremarkable.

Lungs/Pleura: Residual scarring in the area of prior
nodular/masslike opacity in the lateral right upper lobe (series
3/image 49), likely post infectious/inflammatory. However, there is
a progressive 1.9 x 4.3 cm nodular/masslike opacity in the adjacent
right lung apex (series 3/image 37), new/increased, likely
infectious.

9 x 15 mm posterior left upper lobe nodule (series 3/image 50),
previously 10 x 15 mm, unchanged.

11 x 17 mm nodule in the posterior right upper lobe (series 3/image
82), new, likely infectious.

Moderate centrilobular and paraseptal emphysematous changes, upper
lobe predominant.

No pleural effusion or pneumothorax.

Upper Abdomen: Visualized upper abdomen is notable for bilateral
adrenal nodules, measuring up to 2.8 cm on the left, likely
reflecting benign adrenal adenomas.

Musculoskeletal: Degenerative changes of the visualized
thoracolumbar spine.
IMPRESSION: Waxing/waning right lung opacities, suggesting infection, including
atypical/mycobacterial infection. Improvement of the prior dominant
masslike opacity argues against neoplasm.

Stable left upper lobe nodule, likely benign.

Aortic Atherosclerosis ([V7]-[V7]) and Emphysema ([V7]-[V7]).

## 2019-05-01 MED ORDER — IOHEXOL 300 MG/ML  SOLN
75.0000 mL | Freq: Once | INTRAMUSCULAR | Status: AC | PRN
  Administered 2019-05-01: 09:00:00 75 mL via INTRAVENOUS

## 2019-05-02 NOTE — Progress Notes (Signed)
[facility]  [address], [address] [place], [place] [address] Phone: [phone]  Fax: [phone]   Clinic Day:  05/03/2019  Referring physician: [name], MD  Chief Complaint: Leslie Sims is a 68 y.o. male with bilateral pulmonary nodules who is seen for new patient assessment  HPI:   The patient was initially seen in the oncology clinic by Dr. Grayland Ormond on 11/01/018 for bilateral pulmonary lesions suspicious for malignancy. He has a 42 pack smoking history.    Chest CT without contrast on 10/04/2017 revealed two new irregularly marginated nodules, 1.4 x 1.8 cm in the right upper lobe, and 1.3 x 1.7 cm in the mid left upper lobe worrisome for either synchronous lung carcinoma or metastatic involvement of the lungs. Two small nodules described on the prior dictated report had resolved in the left superior segment and most likely were postinflammatory. There was diffuse centrilobular emphysema and changes of prior granulomatous disease.  PET scan on 10/13/2017 revealed the left upper lobe nodule was FDG avid (SUV 5.8) consistent with malignancy. The new nodule in the right upper lobe demonstrated low level uptake (SUV 1.4), with the possibility of an inflammatory or infectious process and a low-grade malignancy could not be excluded. No distant metastatic disease was identified.  Biopsy of the right upper lobe and left upper lobe on 10/27/2017 revealed alveolated lung tissue with hemosiderin laden macrophages, negative for atypia and malignancy.   He was followed with chest CTs every 3 months x 6 months and then transitioned to visits and scans every 6 months.   The patient was last seen in the medical oncology clinic on 11/10/2018 by Dr. Grayland Ormond.   At that time, he was very anxious. He had recently started new medication with his PCP. He was chronically short of breath, requiring oxygen. He was smoking 1 pack/day.   He was last seen by Dr. [name],  pulmonologist, on 02/20/2019.  Notes reviewed.  At that time, he had stopped smoking x 10 days.  He was to continue oxygen 24 hours/day.  He was completing a course of Augmentin and prednisone.  He was to continue Advair and Duoneb.  He was to resume azithromycin 250 mg QOD.  CT chest on 05/01/2019 revealed a stable left upper lobe nodule, likely benign. There was waxing/waning right lung opacities, suggesting infection, including atypical/mycobacterial infection.  Improvement of the prior dominant masslike opacity argued against neoplasm.  Symptomatically, he is feeling tired today. He has been battling anxiety and depression since his wife's death 2.5 years ago. He reports occasional headaches and a mild sore throat. He is eating well and has gained 17 lbs since his last clinic visit.   He is not currently taking the antibiotic prescribed by Dr. [name].  He is on 3L of oxygen and cannot walk very far without getting out of breath.  He quit smoking on 02/10/2019.   Past Medical History:  Diagnosis Date  . Allergic rhinitis   . Anxiety   . Back pain   . Colon polyps 08/2012   colonoscopy; multiple colon polyps; repeat colonoscopy in one year.  [name] COPD (chronic obstructive pulmonary disease) ([place])    a. on home O2.  . Coronary artery disease 11/2009   a. late presenting ant MI; b. LHC 100% pLAD s/p PCI/DES, 99% mRCA s/p PCI/DES, EF 35%; c. nuclear stress test 05/13: prior ant/inf infarcts w/o ischemia, EF 43%; d. LHC 02/14: widely patent stents with no other obs dz, EF 40%;  e. LHC 11/17: LM nl, mLAD 10%, patent LAD stent, dLAD 20%, p-mRCA 10%, mRCA 40%, patent RCA stent, EF 35-45%   . Depression   . GERD (gastroesophageal reflux disease)   . Headache(784.0)   . Helicobacter pylori (H. pylori)   . Hemorrhoids   . Hernia    inguinal  . HFrEF (heart failure with reduced ejection fraction) ([place])    a. 10/2016 LV gram: EF 35-45%; b. 06/2018 Echo: EF 40-45%.  . Hyperlipidemia   . Hypertension    . Insomnia   . Ischemic cardiomyopathy    a. 10/2016 LV gram: EF 35-45%; b. 06/2018 Echo: EF 40-45%.  . Myalgia   . Peptic ulcer   . Pulmonary nodules   . ST elevation (STEMI) myocardial infarction involving left anterior descending coronary artery ([place]) 11/2009   a. s/p PCI to the LAD  . Status post dilation of esophageal narrowing 2000  . Tinea pedis   . Wears dentures    partial upper    Past Surgical History:  Procedure Laterality Date  . Admission  12/20/2012   COPD exacerbation.  [place].  [name] CARDIAC CATHETERIZATION    . CARDIAC CATHETERIZATION  02/05/13   [facility]  . CARDIAC CATHETERIZATION  10/14   [facility] : patent stents with no change in anatomy. EF: 40$  . CARDIAC CATHETERIZATION Left 11/12/2016   Procedure: Left Heart Cath and Coronary Angiography;  Surgeon: Wellington Hampshire, MD;  Location: [facility];  Service: Cardiovascular;  Laterality: Left;  . COLONOSCOPY    . COLONOSCOPY WITH PROPOFOL N/A 05/18/2016   Procedure: COLONOSCOPY WITH PROPOFOL;  Surgeon: Lucilla Lame, MD;  Location: [facility] ENDOSCOPY;  Service: Endoscopy;  Laterality: N/A;  . CORONARY ANGIOPLASTY WITH STENT PLACEMENT  09/2009   LAD 3.0 X23 mm Xience DES, RCA: 4.0 X 15 mm Xience DES  . ELECTROMAGNETIC NAVIGATION BROCHOSCOPY N/A 10/27/2017   Procedure: ELECTROMAGNETIC NAVIGATION BRONCHOSCOPY;  Surgeon: Flora Lipps, MD;  Location: [facility] ORS;  Service: Cardiopulmonary;  Laterality: N/A;  . ESOPHAGEAL DILATION    . ESOPHAGOGASTRODUODENOSCOPY  2008  . ESOPHAGOGASTRODUODENOSCOPY  08/20/2012  . HERNIA REPAIR  07/20/2012   L inguinal hernia repair  . PENILE PROSTHESIS IMPLANT      Family History  Problem Relation Age of Onset  . Heart attack Brother        Brother #1  . Diabetes Brother   . Hypertension Brother        #3  . Coronary artery disease Father 30       deceased  . Heart attack Father   . Diabetes Father   . Heart disease Father   . COPD Mother 65       deceased  . Alcohol abuse Sister         polysubstance abuse  . COPD Sister   . Lung cancer Sister   . Alcohol abuse Sister        polysubstance abuse  . Penile cancer Brother   . Diabetes Brother     Social History:  reports that he quit smoking about 2 months ago. His smoking use included cigarettes. He has a 42.00 pack-year smoking history. He has never used smokeless tobacco. He reports that he does not drink alcohol or use drugs.  He quit smoking 02/10/2019.  He lives in [place] with his dogs. His wife died 2.5 years ago. He is retired by used to work in home improvement. The patient is alone today.  Allergies:  Allergies  Allergen Reactions  .  Prozac [Fluoxetine Hcl] Shortness Of Breath  . Effexor Xr [Venlafaxine Hcl Er] Other (See Comments)    "Makes me feel funny"  . Wellbutrin [Bupropion] Other (See Comments)    "Makes me feel funny"    Current Medications: Current Outpatient Medications  Medication Sig Dispense Refill  . acetaminophen (TYLENOL) 325 MG tablet Take 2 tablets (650 mg total) by mouth every 6 (six) hours as needed for mild pain (or Fever >/= 101).    [name] albuterol (PROVENTIL HFA;VENTOLIN HFA) 108 (90 Base) MCG/ACT inhaler Inhale 2 puffs into the lungs every 6 (six) hours as needed for wheezing or shortness of breath. 1 Inhaler 2  . aspirin 81 MG tablet Take 81 mg by mouth daily.      [name] atorvastatin (LIPITOR) 40 MG tablet Take 1 tablet (40 mg total) by mouth daily. 90 tablet 3  . Fluticasone-Salmeterol (ADVAIR) 500-50 MCG/DOSE AEPB Inhale 1 puff into the lungs every 12 (twelve) hours.    . furosemide (LASIX) 20 MG tablet Take 1 tablet (20 mg total) by mouth daily. 90 tablet 3  . hydrOXYzine (VISTARIL) 25 MG capsule Take 25 mg by mouth 2 (two) times daily as needed for anxiety or itching.   0  . ipratropium-albuterol (DUONEB) 0.5-2.5 (3) MG/3ML SOLN Take 3 mLs by nebulization 4 (four) times daily. DX:J44.9 1620 mL 3  . LORazepam (ATIVAN) 1 MG tablet Take 1 tablet (1 mg total) by mouth every 4 (four) hours  while awake. 30 tablet 0  . losartan (COZAAR) 25 MG tablet Take 25 mg by mouth daily.    . mirtazapine (REMERON) 30 MG tablet Take 1 tablet (30 mg total) by mouth at bedtime. 30 tablet 5  . Multiple Vitamin (MULTIVITAMIN) capsule Take 1 capsule by mouth daily.    [name] OLANZapine (ZYPREXA) 2.5 MG tablet Take 2.5 mg by mouth at bedtime.    . OXYGEN Inhale 3 L into the lungs daily.     . sucralfate (CARAFATE) 1 GM/10ML suspension Take 10 mLs (1 g total) by mouth 4 (four) times daily -  with meals and at bedtime. (Patient taking differently: Take 1 g by mouth 4 (four) times daily as needed. ) 3600 mL 3  . tiotropium (SPIRIVA) 18 MCG inhalation capsule Place 18 mcg into inhaler and inhale daily.    [name] guaiFENesin-dextromethorphan (ROBITUSSIN DM) 100-10 MG/5ML syrup Take 5 mLs by mouth every 4 (four) hours as needed for cough. (Patient not taking: Reported on 05/03/2019) 118 mL 0  . nitroGLYCERIN (NITROSTAT) 0.4 MG SL tablet Place 1 tablet (0.4 mg total) under the tongue every 5 (five) minutes as needed. (Patient not taking: Reported on 05/03/2019) 25 tablet 3  . sertraline (ZOLOFT) 100 MG tablet Take 200 mg by mouth daily.   2   No current facility-administered medications for this visit.     Review of Systems  Constitutional: Positive for malaise/fatigue. Negative for chills, diaphoresis, fever and weight loss.       Gained 17lbs.  HENT: Positive for sore throat (mild). Negative for congestion, hearing loss, nosebleeds and sinus pain.   Eyes: Negative for blurred vision and double vision.  Respiratory: Positive for cough (chronic) and shortness of breath. Negative for wheezing.   Cardiovascular: Negative for chest pain, palpitations, orthopnea, leg swelling and PND.  Gastrointestinal: Negative for abdominal pain, blood in stool, constipation, diarrhea, heartburn, melena, nausea and vomiting.  Genitourinary: Negative for dysuria, frequency, hematuria and urgency.  Musculoskeletal: Negative for joint  pain and myalgias.  Skin: Negative  for itching and rash.  Neurological: Positive for headaches. Negative for dizziness, tingling, sensory change and weakness.  Endo/Heme/Allergies: Does not bruise/bleed easily.  Psychiatric/Behavioral: Positive for depression. The patient is nervous/anxious.   All other systems reviewed and are negative.  Performance status (ECOG):  2  Physical Exam  Constitutional: He is oriented to person, place, and time. He appears well-developed and well-nourished. No distress.  HENT:  Head: Normocephalic and atraumatic.  Mouth/Throat: Oropharynx is clear and moist.  Dark hair. Wearing a face mask.  Eyes: Pupils are equal, round, and reactive to light. Conjunctivae and EOM are normal.  Neck: Normal range of motion. Neck supple.  Cardiovascular: Normal rate, regular rhythm and normal heart sounds.  No murmur heard. Pulmonary/Chest: Effort normal. He has wheezes in the right lower field.  Oxygen by nasal canula, 3L daily  Abdominal: Soft. Bowel sounds are normal. He exhibits no distension. There is no abdominal tenderness.  Musculoskeletal: Normal range of motion.        General: No edema.  Lymphadenopathy:    He has no cervical adenopathy.    He has no axillary adenopathy.       Right: No inguinal and no supraclavicular adenopathy present.       Left: No inguinal and no supraclavicular adenopathy present.  Neurological: He is alert and oriented to person, place, and time.  Skin: Skin is warm and dry. He is not diaphoretic.  Tattoo "love" on his left knuckles.  Psychiatric: He has a normal mood and affect. His behavior is normal. Judgment and thought content normal.  Nursing note and vitals reviewed.   Appointment on 05/01/2019  Component Date Value Ref Range Status  . Sodium 05/01/2019 138  135 - 145 mmol/L Final  . Potassium 05/01/2019 4.1  3.5 - 5.1 mmol/L Final  . Chloride 05/01/2019 97* 98 - 111 mmol/L Final  . CO2 05/01/2019 32  22 - 32 mmol/L Final   . Glucose, Bld 05/01/2019 112* 70 - 99 mg/dL Final  . BUN 05/01/2019 10  8 - 23 mg/dL Final  . Creatinine, Ser 05/01/2019 0.88  0.61 - 1.24 mg/dL Final  . Calcium 05/01/2019 9.2  8.9 - 10.3 mg/dL Final  . Total Protein 05/01/2019 7.0  6.5 - 8.1 g/dL Final  . Albumin 05/01/2019 4.0  3.5 - 5.0 g/dL Final  . AST 05/01/2019 22  15 - 41 U/L Final  . ALT 05/01/2019 26  0 - 44 U/L Final  . Alkaline Phosphatase 05/01/2019 82  38 - 126 U/L Final  . Total Bilirubin 05/01/2019 0.2* 0.3 - 1.2 mg/dL Final  . GFR calc non Af Amer 05/01/2019 >60  >60 mL/min Final  . GFR calc Af Amer 05/01/2019 >60  >60 mL/min Final  . Anion gap 05/01/2019 9  5 - 15 Final   Performed at [facility], [address] [place], [place] [address]  . WBC 05/01/2019 7.5  4.0 - 10.5 K/uL Final  . RBC 05/01/2019 4.60  4.22 - 5.81 MIL/uL Final  . Hemoglobin 05/01/2019 13.4  13.0 - 17.0 g/dL Final  . HCT 05/01/2019 42.5  39.0 - 52.0 % Final  . MCV 05/01/2019 92.4  80.0 - 100.0 fL Final  . MCH 05/01/2019 29.1  26.0 - 34.0 pg Final  . MCHC 05/01/2019 31.5  30.0 - 36.0 g/dL Final  . RDW 05/01/2019 12.2  11.5 - 15.5 % Final  . Platelets 05/01/2019 256  150 - 400 K/uL Final  . nRBC 05/01/2019 0.0  0.0 - 0.2 % Final  . Neutrophils Relative % 05/01/2019 61  % Final  . Neutro Abs 05/01/2019 4.6  1.7 - 7.7 K/uL Final  . Lymphocytes Relative 05/01/2019 20  % Final  . Lymphs Abs 05/01/2019 1.5  0.7 - 4.0 K/uL Final  . Monocytes Relative 05/01/2019 13  % Final  . Monocytes Absolute 05/01/2019 1.0  0.1 - 1.0 K/uL Final  . Eosinophils Relative 05/01/2019 4  % Final  . Eosinophils Absolute 05/01/2019 0.3  0.0 - 0.5 K/uL Final  . Basophils Relative 05/01/2019 1  % Final  . Basophils Absolute 05/01/2019 0.0  0.0 - 0.1 K/uL Final  . Immature Granulocytes 05/01/2019 1  % Final  . Abs Immature Granulocytes 05/01/2019 0.06  0.00 - 0.07 K/uL Final   Performed at [facility], [address] [place], [place] [address]     Assessment:  Leslie Sims is a 68 y.o. male with bilateral pulmonary nodules s/p electromagnetic navigation bronchoscopy on 10/27/2017.  Pathology revealed alveolated lung tissue with hemosiderin laden macrophages, negative for atypia and malignancy from the RUL and LUL.  He has a 42 pack year smoking history.  Chest CT without contrast on 10/04/2017 revealed two new irregularly marginated nodules, 1.4 x 1.8 cm in the right upper lobe, and 1.3 x 1.7 cm in the mid left upper lobe worrisome for either synchronous lung carcinoma or metastatic involvement of the lungs. Two small nodules described on the prior dictated report had resolved in the left superior segment and most likely were postinflammatory. There was diffuse centrilobular emphysema and changes of prior granulomatous disease.  PET scan on 10/13/2017 revealed the left upper lobe nodule was FDG avid (SUV 5.8) consistent with malignancy. The new nodule in the right upper lobe demonstrated low level uptake (SUV 1.4), with the possibility of an inflammatory or infectious process and a low-grade malignancy could not be excluded. No distant metastatic disease was identified.  CT chest on 05/01/2019 revealed a stable left upper lobe nodule, likely benign. There was waxing/waning right lung opacities, suggesting infection, including atypical/mycobacterial infection.  Improvement of the prior dominant masslike opacity argued against neoplasm.  Symptomatically, he is fatigued.  He is on 3 liters of oxygen.  Exam reveals wheezes in the RLL.  Plan: 1.   Bilateral pulmonary nodules  Review entire medical history including ongoing surveillance of bilateral pulmonary nodules.  Review chest CT from 05/01/2019.  Images personally reviewed.  Agree with radiology interpretation.  Discuss waxing and waning right lung opacities including 1.9 x 4.3 cm nodular/masslike opacity in the right lung apex.  Patient followed by Dr. Merton Border  Discuss  tumor board follow-up.  Anticipate ongoing surveillance. 2.   Tobacco use  Patient quit smoking 02/10/2019.  Applauded patient for smoking cessation. 3.  Schedule chest CT in 3 months. 4.   RTC after CT scan for MD assessment.  I discussed the assessment and treatment plan with the patient.  The patient was provided an opportunity to ask questions and all were answered.  The patient agreed with the plan and demonstrated an understanding of the instructions.  The patient was advised to call back if the symptoms worsen or if the condition fails to improve as anticipated.   Lequita Asal, MD, PhD    05/03/2019, 11:12 AM  I, Molly Dorshimer, am acting as Education administrator for [employer]. Mike Gip, MD, PhD.  I, Melissa C. Mike Gip, MD, have reviewed the above documentation for accuracy and completeness, and I  agree with the above.

## 2019-05-03 ENCOUNTER — Inpatient Hospital Stay (HOSPITAL_BASED_OUTPATIENT_CLINIC_OR_DEPARTMENT_OTHER): Payer: Medicare Other | Admitting: Hematology and Oncology

## 2019-05-03 ENCOUNTER — Other Ambulatory Visit: Payer: Self-pay

## 2019-05-03 VITALS — BP 117/75 | HR 92 | Temp 97.7°F | Resp 18 | Wt 163.0 lb

## 2019-05-03 DIAGNOSIS — Z87891 Personal history of nicotine dependence: Secondary | ICD-10-CM | POA: Diagnosis not present

## 2019-05-03 DIAGNOSIS — Z8049 Family history of malignant neoplasm of other genital organs: Secondary | ICD-10-CM

## 2019-05-03 DIAGNOSIS — Z9981 Dependence on supplemental oxygen: Secondary | ICD-10-CM | POA: Diagnosis not present

## 2019-05-03 DIAGNOSIS — R918 Other nonspecific abnormal finding of lung field: Secondary | ICD-10-CM | POA: Diagnosis not present

## 2019-05-03 NOTE — Progress Notes (Signed)
Pt here for follow up. Previous Dr. [name] patient. Denies any concerns.  

## 2019-05-04 ENCOUNTER — Encounter: Payer: Self-pay | Admitting: Hematology and Oncology

## 2019-05-08 ENCOUNTER — Telehealth: Payer: Self-pay | Admitting: Primary Care

## 2019-05-08 NOTE — Telephone Encounter (Signed)
Patient had requested that I call back in a couple of weeks to schedule the Palliative Consult (previously called on 04/23/19), called him today with no answer.  Left message requesting a call back to let me know whether or not he wanted to pursue Palliative services at this time.

## 2019-05-10 ENCOUNTER — Other Ambulatory Visit: Payer: Self-pay | Admitting: Hematology and Oncology

## 2019-05-10 ENCOUNTER — Other Ambulatory Visit: Payer: Medicare Other

## 2019-05-10 NOTE — Progress Notes (Signed)
Tumor Board Documentation  Leslie Sims was presented by DR Mike Gip at our Tumor Board on 05/10/2019, which included representatives from medical oncology, radiation oncology, surgical oncology, surgical, radiology, pathology, navigation, research, palliative care, pulmonology.  Leslie Sims currently presents as a current patient, for discussion with history of the following treatments: active survellience.  Additionally, we reviewed previous medical and familial history, history of present illness, and recent lab results along with all available histopathologic and imaging studies. The tumor board considered available treatment options and made the following recommendations: Active surveillance    Re scan in 6- 12 months    The following procedures/referrals were also placed: No orders of the defined types were placed in this encounter.   Clinical Trial Status: not discussed   Staging used:    National site-specific guidelines   were discussed with respect to the case.  Tumor board is a meeting of clinicians from various specialty areas who evaluate and discuss patients for whom a multidisciplinary approach is being considered. Final determinations in the plan of care are those of the provider(s). The responsibility for follow up of recommendations given during tumor board is that of the provider.   Today's extended care, comprehensive team conference, [name] was not present for the discussion and was not examined.   Multidisciplinary Tumor Board is a multidisciplinary case peer review process.  Decisions discussed in the Multidisciplinary Tumor Board reflect the opinions of the specialists present at the conference without having examined the patient.  Ultimately, treatment and diagnostic decisions rest with the primary provider(s) and the patient.

## 2019-05-16 ENCOUNTER — Other Ambulatory Visit: Payer: Self-pay

## 2019-05-16 DIAGNOSIS — R911 Solitary pulmonary nodule: Secondary | ICD-10-CM

## 2019-05-16 DIAGNOSIS — R918 Other nonspecific abnormal finding of lung field: Secondary | ICD-10-CM

## 2019-05-17 ENCOUNTER — Telehealth: Payer: Self-pay | Admitting: Primary Care

## 2019-05-18 ENCOUNTER — Telehealth: Payer: Self-pay

## 2019-05-18 NOTE — Telephone Encounter (Signed)
I was calling to reschedule the Palliative Consult

## 2019-05-18 NOTE — Telephone Encounter (Signed)
Virtual Visit Pre-Appointment Phone Call  "Amontae, I am calling you today to discuss your upcoming appointment. We are currently trying to limit exposure to the virus that causes COVID-19 by seeing patients at home rather than in the office."  1. "What is the BEST phone number to call the day of the visit?" - include this in appointment notes  2. Do you have or have access to (through a family member/friend) a smartphone with video capability that we can use for your visit?" a. If yes - list this number in appt notes as cell (if different from BEST phone #) and list the appointment type as a VIDEO visit in appointment notes b. If no - list the appointment type as a PHONE visit in appointment notes  3. Confirm consent - "In the setting of the current Covid19 crisis, you are scheduled for a phone visit with your provider on May 25, 2019 at 2:20PM.  Just as we do with many in-office visits, in order for you to participate in this visit, we must obtain consent.  If you'd like, I can send this to your mychart (if signed up) or email for you to review.  Otherwise, I can obtain your verbal consent now.  All virtual visits are billed to your insurance company just like a normal visit would be.  By agreeing to a virtual visit, we'd like you to understand that the technology does not allow for your provider to perform an examination, and thus may limit your provider's ability to fully assess your condition. If your provider identifies any concerns that need to be evaluated in person, we will make arrangements to do so.  Finally, though the technology is pretty good, we cannot assure that it will always work on either your or our end, and in the setting of a video visit, we may have to convert it to a phone-only visit.  In either situation, we cannot ensure that we have a secure connection.  Are you willing to proceed?" STAFF: Did the patient verbally acknowledge consent to telehealth visit? Document YES/NO  here: YES  4. Advise patient to be prepared - "Two hours prior to your appointment, go ahead and check your blood pressure, pulse, oxygen saturation, and your weight (if you have the equipment to check those) and write them all down. When your visit starts, your provider will ask you for this information. If you have an Apple Watch or Kardia device, please plan to have heart rate information ready on the day of your appointment. Please have a pen and paper handy nearby the day of the visit as well."  5. Give patient instructions for MyChart download to smartphone OR [email or web address] as below if video visit (depending on what platform provider is using)  6. Inform patient they will receive a phone call 15 minutes prior to their appointment time (may be from unknown caller ID) so they should be prepared to answer    TELEPHONE CALL NOTE  Cardale Dorer has been deemed a candidate for a follow-up tele-health visit to limit community exposure during the Covid-19 pandemic. I spoke with the patient via phone to ensure availability of phone/video source, confirm preferred email & phone number, and discuss instructions and expectations.  I reminded Shivaay Stormont Ullery to be prepared with any vital sign and/or heart rhythm information that could potentially be obtained via home monitoring, at the time of his visit. I reminded Khiyan Crace [facility] to expect a phone call prior to  his visit.  Rene Paci McClain 05/18/2019 11:44 AM    FULL LENGTH CONSENT FOR TELE-HEALTH VISIT   I hereby voluntarily request, consent and authorize [facility] and its employed or contracted physicians, physician assistants, nurse practitioners or other licensed health care professionals (the Practitioner), to provide me with telemedicine health care services (the Services") as deemed necessary by the treating Practitioner. I acknowledge and consent to receive the Services by the Practitioner via telemedicine. I  understand that the telemedicine visit will involve communicating with the Practitioner through live audiovisual communication technology and the disclosure of certain medical information by electronic transmission. I acknowledge that I have been given the opportunity to request an in-person assessment or other available alternative prior to the telemedicine visit and am voluntarily participating in the telemedicine visit.  I understand that I have the right to withhold or withdraw my consent to the use of telemedicine in the course of my care at any time, without affecting my right to future care or treatment, and that the Practitioner or I may terminate the telemedicine visit at any time. I understand that I have the right to inspect all information obtained and/or recorded in the course of the telemedicine visit and may receive copies of available information for a reasonable fee.  I understand that some of the potential risks of receiving the Services via telemedicine include:   Delay or interruption in medical evaluation due to technological equipment failure or disruption;  Information transmitted may not be sufficient (e.g. poor resolution of images) to allow for appropriate medical decision making by the Practitioner; and/or   In rare instances, security protocols could fail, causing a breach of personal health information.  Furthermore, I acknowledge that it is my responsibility to provide information about my medical history, conditions and care that is complete and accurate to the best of my ability. I acknowledge that Practitioner's advice, recommendations, and/or decision may be based on factors not within their control, such as incomplete or inaccurate data provided by me or distortions of diagnostic images or specimens that may result from electronic transmissions. I understand that the practice of medicine is not an exact science and that Practitioner makes no warranties or guarantees  regarding treatment outcomes. I acknowledge that I will receive a copy of this consent concurrently upon execution via email to the email address I last provided but may also request a printed copy by calling the office of [facility].    I understand that my insurance will be billed for this visit.   I have read or had this consent read to me.  I understand the contents of this consent, which adequately explains the benefits and risks of the Services being provided via telemedicine.   I have been provided ample opportunity to ask questions regarding this consent and the Services and have had my questions answered to my satisfaction.  I give my informed consent for the services to be provided through the use of telemedicine in my medical care  By participating in this telemedicine visit I agree to the above.

## 2019-05-18 NOTE — Telephone Encounter (Signed)
Called patient's home and cell number to reschedule the Palliative f/u visit with no answer.  Left messages for both numbers requesting a call back.

## 2019-05-21 ENCOUNTER — Telehealth: Payer: Self-pay | Admitting: Primary Care

## 2019-05-21 NOTE — Telephone Encounter (Signed)
Received call back from patient regarding Palliative Consult.  He was asking me to explain what this was.  I explained to him what Palliative was and I also asked him if he was still receiving home health services and he said yes.  After a short discussion he requested that we wait to schedule the consult until he could talk with his MD. I asked him who the MD was and he said [name],  I asked him when his appointment was and he said he had not made one yet but was going to.  I told him to let me know something as soon as possible and he agreed to.

## 2019-05-24 ENCOUNTER — Encounter: Payer: Self-pay | Admitting: Pulmonary Disease

## 2019-05-24 ENCOUNTER — Other Ambulatory Visit: Payer: Self-pay

## 2019-05-24 ENCOUNTER — Ambulatory Visit (INDEPENDENT_AMBULATORY_CARE_PROVIDER_SITE_OTHER): Payer: Medicare Other | Admitting: Pulmonary Disease

## 2019-05-24 VITALS — BP 160/90 | HR 108 | Temp 98.2°F | Ht 70.0 in | Wt 166.0 lb

## 2019-05-24 DIAGNOSIS — Z87891 Personal history of nicotine dependence: Secondary | ICD-10-CM | POA: Diagnosis not present

## 2019-05-24 DIAGNOSIS — I255 Ischemic cardiomyopathy: Secondary | ICD-10-CM

## 2019-05-24 DIAGNOSIS — J449 Chronic obstructive pulmonary disease, unspecified: Secondary | ICD-10-CM | POA: Diagnosis not present

## 2019-05-24 DIAGNOSIS — J9611 Chronic respiratory failure with hypoxia: Secondary | ICD-10-CM

## 2019-05-24 DIAGNOSIS — R918 Other nonspecific abnormal finding of lung field: Secondary | ICD-10-CM | POA: Diagnosis not present

## 2019-05-24 MED ORDER — NICOTINE POLACRILEX 2 MG MT GUM: 2.0000 mg | CHEWING_GUM | OROMUCOSAL | 5 refills | Status: DC | PRN

## 2019-05-24 NOTE — Patient Instructions (Addendum)
Continue Advair, Spiriva inhalers as maintenance therapies Continue DuoNeb as needed for increased shortness of breath, wheezing, chest tightness, cough Continue albuterol inhaler as needed when unable to use DuoNeb Continue oxygen therapy: 2 LPM continuous flow, 3 LPM pulse flow Recertified for oxygen therapy today Prescription for nicotine gum entered -use as needed for nicotine withdrawal symptoms Follow-up in 4 months with Dr. Patsey Berthold

## 2019-05-24 NOTE — Progress Notes (Signed)
PULMONARY OFFICE FOLLOW UP NOTE  Requesting MD/Service: [name], MD Date of initial consultation: 08/10/16 Reason for consultation: COPD, smoker  PT PROFILE: 21 M smoker with severe emphysema seen by multiple pulmonologists in past ([place], [place], [place], [place]) referred for further eval and mgmt of COPD   DATA: 07/24/12 Office Spirometry: Severe obstruction (FEV1 31% pred) 08/31/13 Office Spirometry: very severe obstruction (FEV1 25% pred) 06/18/15 CT chest: Severe emphysema 03/03/17 Overnight oximetry: Lowest SpO2 72%. 95% of time SpO2 was below 90% 03/03/17 6MWT: 384 meters. Desat to 84% 10/04/17 CT chest: Two new irregularly marginated nodules, one in the RUL, and a second in the mid LUL worrisome for either synchronous lung ca or metastatic involvement of the lungs. The two small nodules described on the prior dictated report have resolved in the left superior segment and most likely were postinflammatory. Diffuse centrilobular emphysema. 10/13/17 PET scan: The new left upper lobe nodule is FDG avid consistent with malignancy. Recommend tissue confirmation. The new nodule in the right upper lobe demonstrates low level uptake. While the level of uptake suggests the possibility of an inflammatory or infectious process, a low-grade malignancy is not excluded. Given the suspected malignancy on the left, tissue confirmation should be considered on the right despite the low level of uptake. No distant metastatic disease identified 10/27/17 ENB ([place]): Transbronchial Fine Needle Aspirations 21G X7, Transbronchial Forceps Biopsy X6. ALVEOLATED LUNG TISSUE WITH HEMOSIDERIN LADEN MACROPHAGES.  NEGATIVE FOR ATYPIA AND MALIGNANCY 11/01/17 PFTs: FVC: 2.96 > 3.14 L (68 > 72 %pred), FEV1: 0.94 > 0.93 L (27 %pred), FEV1/FVC: 32%, TLC: 9.04 L (137 %pred), DLCO 62 %pred  12/07/17 CTA chest: No pulmonary embolus identified. Anterior left upper lobe ground-glass and nodular consolidation likely  representing pneumonitis. Stable bilateral upper lobe pulmonary nodules. Stable emphysema. 01/21/18 CT chest: new masslike architectural distortion and ground-glass attenuation surrounding the index nodule within the RUL. The appearance favors an inflammatory or infectious process. The index nodule in the left upper lobe is slightly decreased in volume when compared with the previous exam favoring a benign process. Attention on follow-up imaging advise. Advanced changes of emphysema (very severe) 06/01/18 CTA chest: No evidence of pulmonary embolism. Waxing and waning bilateral pulmonary nodular process as described. New 8 mm nodular ill-defined focus of opacification over the lingula which may represent an acute inflammatory or infectious process. No effusion. Emphysema  10/19/18 CTA chest: No acute cardiopulmonary disease and no evidence of pulm embolism. Hospitalization 1/12-1/14/2020: COPD exacerbation Hospitalization 2/22-2/27/20: COPD exacerbation, HCAP 02/11/19 CT chest: Progressed masslike consolidation within the RUL in the region of previous waxing and waning nodule, this is contiguous with the pleural surface and demonstrates spiculated margin and probable small focus of central cavitation. Findings could be secondary to infection superimposed on chronic nodule. However cavitary neoplasm is also a concern. Multiple additional lung nodules which have not significantly changed. Small foci of ground-glass density in the RUL may reflect small focus of infection. Mod-severe emphysema 05/01/19 CT chest: Waxing/waning right lung opacities, suggesting infection, including atypical/mycobacterial infection. Improvement of the prior dominant masslike opacity argues against neoplasm. Stable left upper lobe nodule, likely benign. Aortic Atherosclerosis and Emphysema  INTERVAL: Last seen by me 02/20/19.  No major events   SUBJ: This is a scheduled follow-up. He remains abstinent from cigarettes. Overall,  he has been stable to slightly improved. He has gained significant amount of weight since quitting smoking. He continues to have moderate to severe exertional dyspnea with little day to day or moment to  moment variation. He has no new complaints.  He continues to have chronic cough productive of mild amount of "phlegm".  He denies CP, fever, purulent sputum, hemoptysis, LE edema and calf tenderness.  He needs recert for home O2  OBJ: Vitals:   05/24/19 1134 05/24/19 1136  BP:  (!) 160/90  Pulse:  (!) 108  Temp:  98.2 F (36.8 C)  TempSrc:  Temporal  SpO2:  95%  Weight: 166 lb (75.3 kg)   Height: 5\' 10"  (1.778 m)   4 LPM pulse  EXAM:  No overt distress HEENT WNL No JVD, no lymphadenopathy BS moderately diminished without wheezes RRR, no M NT, NABS Extremities warm, no edema No focal neurologic deficit  DATA:   BMP Latest Ref Rng & Units 05/01/2019 02/12/2019 02/10/2019  Glucose 70 - 99 mg/dL 112(H) 95 125(H)  BUN 8 - 23 mg/dL 10 17 16   Creatinine 0.61 - 1.24 mg/dL 0.88 0.67 0.60(L)  BUN/Creat Ratio 10 - 24 - - -  Sodium 135 - 145 mmol/L 138 141 136  Potassium 3.5 - 5.1 mmol/L 4.1 4.0 3.8  Chloride 98 - 111 mmol/L 97(L) 105 95(L)  CO2 22 - 32 mmol/L 32 32 33(H)  Calcium 8.9 - 10.3 mg/dL 9.2 8.7(L) 8.9    CBC Latest Ref Rng & Units 05/01/2019 02/12/2019 02/10/2019  WBC 4.0 - 10.5 K/uL 7.5 9.3 10.9(H)  Hemoglobin 13.0 - 17.0 g/dL 13.4 10.2(L) 11.9(L)  Hematocrit 39.0 - 52.0 % 42.5 32.7(L) 38.1(L)  Platelets 150 - 400 K/uL 256 203 253   CXR: No new film  IMPRESSION:   Chronic hypoxemic respiratory failure (HCC)  COPD, very severe (HCC)  Bilateral pulmonary opacities  Former smoker   PLAN:  I again congratulated him on his efforts at smoking cessation. I strongly encouraged that he remain abstinent  We discussed the pulmonary opacities which have waxed and waned over a couple of years. He is followed by Oncology for this problem. He was discussed [facility] at [facility]. It  is very possible that one or more of these opacities is due to malignancy. However, his COPD is so severe that there would be little to offer if a malignancy were to be proven. Next CT chest is scheduled in 6 months  Cont O2 as follows - 2 LPM continuous flow or 3 LPM pulse. He is to wear it as close to 24 hrs/day as possible  Continue Advair inhaler, 1 inhalation twice a day.  Rinse mouth after use. Cont Spiriva daily  Continue DuoNeb up to every 4-6 hours as needed  Cont azithromycin 250 mg every other day  Recertified for home O2 this encounter  Follow up in 4 months with Dr Patsey Berthold. Now that he has quit smoking, it might be worth rechecking PFTs to reconsider whether he is a candidate for more aggressive eval of lung opacities  Merton Border, MD PCCM service Mobile [phone] Pager [phone] 05/27/2019  2:05 PM

## 2019-05-25 ENCOUNTER — Telehealth (INDEPENDENT_AMBULATORY_CARE_PROVIDER_SITE_OTHER): Payer: Medicare Other | Admitting: Cardiovascular Disease

## 2019-05-25 ENCOUNTER — Encounter: Payer: Self-pay | Admitting: Cardiovascular Disease

## 2019-05-25 VITALS — BP 117/74 | HR 97 | Ht 70.0 in | Wt 163.2 lb

## 2019-05-25 DIAGNOSIS — I2510 Atherosclerotic heart disease of native coronary artery without angina pectoris: Secondary | ICD-10-CM

## 2019-05-25 NOTE — Progress Notes (Signed)
Virtual Visit via Telephone Note   This visit type was conducted due to national recommendations for restrictions regarding the COVID-19 Pandemic (e.g. social distancing) in an effort to limit this patient's exposure and mitigate transmission in our community.  Due to his co-morbid illnesses, this patient is at least at moderate risk for complications without adequate follow up.  This format is felt to be most appropriate for this patient at this time.  The patient did not have access to video technology/had technical difficulties with video requiring transitioning to audio format only (telephone).  All issues noted in this document were discussed and addressed.  No physical exam could be performed with this format.  Please refer to the patient's chart for his  consent to telehealth for [facility].   Date:  05/25/2019   ID:  Leslie Sims, DOB 1951/05/30, MRN [id number]  Patient Location: Home Provider Location: Office  PCP:  Leslie Honour, MD  Cardiologist:  Kathlyn Sacramento, MD  Electrophysiologist:  None   Evaluation Performed:  Follow-Up Visit  Chief Complaint: Shortness of breath.  History of Present Illness:    Leslie Sims is a 68 y.o. male who was reached via phone for follow-up visit regarding coronary artery disease and ischemic cardiomyopathy.  He has known history of severe COPD on home oxygen, prolonged history of tobacco use but quit recently and hyperlipidemia. He is status post anterior myocardial infarction in 2010 with finding of an occluded LAD and a 99% stenosis in the RCA.  He required drug-eluting stent placement to the LAD and RCA at that time.  He subsequently underwent repeat catheterization November 2017 which showed patent LAD and RCA stents with an EF of 35 to 40%.  He had multiple hospitalizations over the winter for COPD exacerbation and he was finally able to quit smoking.  No hospitalization since then.  He feels better.  No chest pain.    The patient does not have symptoms concerning for COVID-19 infection (fever, chills, cough, or new shortness of breath).    Past Medical History:  Diagnosis Date  . Allergic rhinitis   . Anxiety   . Back pain   . Colon polyps 08/2012   colonoscopy; multiple colon polyps; repeat colonoscopy in one year.  [name] COPD (chronic obstructive pulmonary disease) ([place])    a. on home O2.  . Coronary artery disease 11/2009   a. late presenting ant MI; b. LHC 100% pLAD s/p PCI/DES, 99% mRCA s/p PCI/DES, EF 35%; c. nuclear stress test 05/13: prior ant/inf infarcts w/o ischemia, EF 43%; d. LHC 02/14: widely patent stents with no other obs dz, EF 40%; e. LHC 11/17: LM nl, mLAD 10%, patent LAD stent, dLAD 20%, p-mRCA 10%, mRCA 40%, patent RCA stent, EF 35-45%   . Depression   . GERD (gastroesophageal reflux disease)   . Headache(784.0)   . Helicobacter pylori (H. pylori)   . Hemorrhoids   . Hernia    inguinal  . HFrEF (heart failure with reduced ejection fraction) ([place])    a. 10/2016 LV gram: EF 35-45%; b. 06/2018 Echo: EF 40-45%.  . Hyperlipidemia   . Hypertension   . Insomnia   . Ischemic cardiomyopathy    a. 10/2016 LV gram: EF 35-45%; b. 06/2018 Echo: EF 40-45%.  . Myalgia   . Peptic ulcer   . Pulmonary nodules   . ST elevation (STEMI) myocardial infarction involving left anterior descending coronary artery ([place]) 11/2009   a. s/p PCI to the LAD  .  Status post dilation of esophageal narrowing 2000  . Tinea pedis   . Wears dentures    partial upper   Past Surgical History:  Procedure Laterality Date  . Admission  12/20/2012   COPD exacerbation.  [place].  [name] CARDIAC CATHETERIZATION    . CARDIAC CATHETERIZATION  02/05/13   [facility]  . CARDIAC CATHETERIZATION  10/14   [facility] : patent stents with no change in anatomy. EF: 40$  . CARDIAC CATHETERIZATION Left 11/12/2016   Procedure: Left Heart Cath and Coronary Angiography;  Surgeon: Wellington Hampshire, MD;  Location: [facility];  Service:  Cardiovascular;  Laterality: Left;  . COLONOSCOPY    . COLONOSCOPY WITH PROPOFOL N/A 05/18/2016   Procedure: COLONOSCOPY WITH PROPOFOL;  Surgeon: Lucilla Lame, MD;  Location: [facility] ENDOSCOPY;  Service: Endoscopy;  Laterality: N/A;  . CORONARY ANGIOPLASTY WITH STENT PLACEMENT  09/2009   LAD 3.0 X23 mm Xience DES, RCA: 4.0 X 15 mm Xience DES  . ELECTROMAGNETIC NAVIGATION BROCHOSCOPY N/A 10/27/2017   Procedure: ELECTROMAGNETIC NAVIGATION BRONCHOSCOPY;  Surgeon: Flora Lipps, MD;  Location: [facility] ORS;  Service: Cardiopulmonary;  Laterality: N/A;  . ESOPHAGEAL DILATION    . ESOPHAGOGASTRODUODENOSCOPY  2008  . ESOPHAGOGASTRODUODENOSCOPY  08/20/2012  . HERNIA REPAIR  07/20/2012   L inguinal hernia repair  . PENILE PROSTHESIS IMPLANT       No outpatient medications have been marked as taking for the 05/25/19 encounter (Appointment) with Wellington Hampshire, MD.     Allergies:   Prozac [fluoxetine hcl]; Effexor xr [venlafaxine hcl er]; and Wellbutrin [bupropion]   Social History   Tobacco Use  . Smoking status: Former Smoker    Packs/day: 1.00    Years: 42.00    Pack years: 42.00    Types: Cigarettes    Last attempt to quit: 02/10/2019    Years since quitting: 0.2  . Smokeless tobacco: Never Used  Substance Use Topics  . Alcohol use: No    Alcohol/week: 0.0 standard drinks  . Drug use: No     Family Hx: The patient's family history includes Alcohol abuse in his sister and sister; COPD in his sister; COPD (age of onset: 49) in his mother; Coronary artery disease (age of onset: 49) in his father; Diabetes in his brother, brother, and father; Heart attack in his brother and father; Heart disease in his father; Hypertension in his brother; Lung cancer in his sister; Penile cancer in his brother.  ROS:   Please see the history of present illness.     All other systems reviewed and are negative.   Prior CV studies:   The following studies were reviewed today:  Echocardiogram in July 2019: EF 40  to 45%.  Labs/Other Tests and Data Reviewed:    EKG:  No ECG reviewed.  Recent Labs: 12/31/2018: B Natriuretic Peptide 31.0 02/10/2019: TSH 2.917 05/01/2019: ALT 26; BUN 10; Creatinine, Ser 0.88; Hemoglobin 13.4; Platelets 256; Potassium 4.1; Sodium 138   Recent Lipid Panel Lab Results  Component Value Date/Time   CHOL 143 07/27/2017 03:25 PM   TRIG 72 07/27/2017 03:25 PM   TRIG 65 01/15/2010   HDL 62 07/27/2017 03:25 PM   CHOLHDL 2.3 07/27/2017 03:25 PM   CHOLHDL 1.8 08/24/2016 01:44 PM   LDLCALC 67 07/27/2017 03:25 PM    Wt Readings from Last 3 Encounters:  05/24/19 166 lb (75.3 kg)  05/03/19 163 lb 0.5 oz (73.9 kg)  02/21/19 146 lb 8 oz (66.5 kg)     Objective:  Vital Signs:  There were no vitals taken for this visit.   VITAL SIGNS:  reviewed  ASSESSMENT & PLAN:    1.  Coronary artery disease involving native coronary arteries without angina: Status post prior anterior infarct with drug-eluting stent placement to the LAD and RCA in 2010.  Catheterization 2017 showed stable anatomy.  No recent chest pain.  Continue medical therapy 2.    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  Continue small dose losartan.  He did not tolerate beta-blockers in the past due to lung disease.  3.  Hyperlipidemia: Continue treatment with atorvastatin.  Most recent LDL was below 70.   COVID-19 Education: The signs and symptoms of COVID-19 were discussed with the patient and how to seek care for testing (follow up with PCP or arrange E-visit).  The importance of social distancing was discussed today.  Time:   Today, I have spent 10 minutes with the patient with telehealth technology discussing the above problems.     Medication Adjustments/Labs and Tests Ordered: Current medicines are reviewed at length with the patient today.  Concerns regarding medicines are outlined above.   Tests  Ordered: No orders of the defined types were placed in this encounter.   Medication Changes: No orders of the defined types were placed in this encounter.   Disposition:  Follow up in 4 month(s)  Signed, Kathlyn Sacramento, MD  05/25/2019 1:47 PM    [facility]

## 2019-05-25 NOTE — Patient Instructions (Signed)
Medication Instructions:  Continue same medications If you need a refill on your cardiac medications before your next appointment, please call your pharmacy.   Lab work: None If you have labs (blood work) drawn today and your tests are completely normal, you will receive your results only by: . MyChart Message (if you have MyChart) OR . A paper copy in the mail If you have any lab test that is abnormal or we need to change your treatment, we will call you to review the results.  Testing/Procedures: None  Follow-Up: At [facility], you and your health needs are our priority.  As part of our continuing mission to provide you with exceptional heart care, we have created designated Provider Care Teams.  These Care Teams include your primary Cardiologist (physician) and Advanced Practice Providers (APPs -  Physician Assistants and Nurse Practitioners) who all work together to provide you with the care you need, when you need it. You will need a follow up appointment in 4 months.  Please call our office 2 months in advance to schedule this appointment.  You may see Vernice Bowker, MD or one of the following Advanced Practice Providers on your designated Care Team:   Christopher Berge, NP Ryan Dunn, PA-C . Jacquelyn Visser, PA-C  

## 2019-05-27 ENCOUNTER — Encounter: Payer: Self-pay | Admitting: Pulmonary Disease

## 2019-06-15 ENCOUNTER — Telehealth: Payer: Self-pay | Admitting: Radiology

## 2019-06-15 NOTE — Telephone Encounter (Signed)
Called patient to see if he has decided to pursue Palliative services or if he wished to cancel the referral, no answer.  Left message requesting a call back to let us know.

## 2019-08-02 ENCOUNTER — Ambulatory Visit: Payer: Medicare Other

## 2019-08-03 ENCOUNTER — Ambulatory Visit: Payer: Medicare Other | Admitting: Hematology and Oncology

## 2019-08-06 ENCOUNTER — Ambulatory Visit: Payer: Medicare Other | Admitting: Hematology and Oncology

## 2019-10-03 ENCOUNTER — Ambulatory Visit (INDEPENDENT_AMBULATORY_CARE_PROVIDER_SITE_OTHER): Payer: Medicare Other | Admitting: Pulmonary Disease

## 2019-10-03 ENCOUNTER — Other Ambulatory Visit: Payer: Self-pay

## 2019-10-03 ENCOUNTER — Encounter: Payer: Self-pay | Admitting: Pulmonary Disease

## 2019-10-03 VITALS — BP 126/74 | HR 90 | Temp 98.8°F | Ht 70.0 in | Wt 181.0 lb

## 2019-10-03 DIAGNOSIS — J449 Chronic obstructive pulmonary disease, unspecified: Secondary | ICD-10-CM

## 2019-10-03 DIAGNOSIS — Z87891 Personal history of nicotine dependence: Secondary | ICD-10-CM

## 2019-10-03 DIAGNOSIS — J9611 Chronic respiratory failure with hypoxia: Secondary | ICD-10-CM

## 2019-10-03 DIAGNOSIS — R918 Other nonspecific abnormal finding of lung field: Secondary | ICD-10-CM | POA: Diagnosis not present

## 2019-10-03 NOTE — Progress Notes (Signed)
Subjective:    Patient ID: Leslie Sims, male    DOB: 03/13/1951, 68 y.o.   MRN: [id number]  Requesting MD/Service: Leslie Forts, MD Date of initial consultation: 08/10/16 by Leslie Sims Reason for consultation: COPD, smoker  PT PROFILE: 4 M former smoker quit 02/10/2019 with severe emphysema seen by multiple pulmonologists in past ([place], [place], [place], [place]) referred for further eval and mgmt of COPD.  Previously seen by Leslie Sims, I am assuming care after Leslie Sims.[name]  DATA: 07/24/12 Office Spirometry: Severe obstruction (FEV1 31% pred) 08/31/13 Office Spirometry: very severe obstruction (FEV1 25% pred) 06/18/15 CT chest: Severe emphysema 03/03/17 Overnight oximetry: Lowest SpO2 72%. 95% of time SpO2 was below 90% 03/03/17 6MWT: 384 meters. Desat to 84% 10/04/17 CT chest: Two new irregularly marginated nodules, one in the RUL, and a second in the mid LUL worrisome for either synchronous lung ca or metastatic involvement of the lungs. The two small nodules described on the prior dictated report have resolved in the left superior segment and most likely were postinflammatory. Diffuse centrilobular emphysema. 10/13/17 PET scan: The new left upper lobe nodule is FDG avid consistent with malignancy. Recommend tissue confirmation. The new nodule in the right upper lobe demonstrates low level uptake. While the level of uptake suggests the possibility of an inflammatory or infectious process, a low-grade malignancy is not excluded. Given the suspected malignancy on the left, tissue confirmation should be considered on the right despite the low level of uptake. No distant metastatic disease identified 10/27/17 ENB (Leslie Sims): Transbronchial Fine Needle Aspirations 21G X7, Transbronchial Forceps Biopsy X6. ALVEOLATED LUNG TISSUE WITH HEMOSIDERIN LADEN MACROPHAGES.  NEGATIVE FOR ATYPIA AND MALIGNANCY 11/01/17 PFTs: FVC: 2.96 > 3.14 L (68 > 72 %pred), FEV1:  0.94 > 0.93 L (27 %pred), FEV1/FVC: 32%, TLC: 9.04 L (137 %pred), DLCO 62 %pred  12/07/17 CTA chest: No pulmonary embolus identified. Anterior left upper lobe ground-glass and nodular consolidation likely representing pneumonitis. Stable bilateral upper lobe pulmonary nodules. Stable emphysema. 01/21/18 CT chest: new masslike architectural distortion and ground-glass attenuation surrounding the index nodule within the RUL. The appearance favors an inflammatory or infectious process. The index nodule in the left upper lobe is slightly decreased in volume when compared with the previous exam favoring a benign process. Attention on follow-up imaging advise. Advanced changes of emphysema (very severe) 06/01/18 CTA chest: No evidence of pulmonary embolism. Waxing and waning bilateral pulmonary nodular process as described. New 8 mm nodular ill-defined focus of opacification over the lingula which may represent an acute inflammatory or infectious process. No effusion. Emphysema  10/19/18 CTA chest: No acute cardiopulmonary disease and no evidence of pulm embolism. Hospitalization 1/12-1/14/2020: COPD exacerbation Hospitalization 2/22-2/27/20: COPD exacerbation, HCAP 02/11/19 CT chest: Progressed masslike consolidation within the RUL in the region of previous waxing and waning nodule, this is contiguous with the pleural surface and demonstrates spiculated margin and probable small focus of central cavitation. Findings could be secondary to infection superimposed on chronic nodule. However cavitary neoplasm is also a concern. Multiple additional lung nodules which have not significantly changed. Small foci of ground-glass density in the RUL may reflect small focus of infection. Mod-severe emphysema 05/01/19 CT chest: Waxing/waning right lung opacities, suggesting infection, including atypical/mycobacterial infection. Improvement of the prior dominant masslike opacity argues against neoplasm. Stable left upper lobe  nodule, likely benign. Aortic Atherosclerosis and Emphysema  INTERVAL: Last seen by Leslie Sims 05/24/19.  No major events since that visit.  HPI Leslie Sims is  a 68 year old former smoker who presents for a scheduled follow-up for COPD.  I am assuming care after Leslie Sims departure from the Sims.  He remains abstinent of cigarettes.  He has chronic respiratory failure with hypoxia and is on 2 L/min nasal cannula O2 supplementation.  He is using Advair, Spiriva and DuoNeb.  He feels that the DuoNeb is what helps him the most.  Most of the time he is not sure if he is getting benefit from the Advair Diskus and Spiriva HandiHaler.  He is on a azithromycin for exacerbation prevention.  He has severe COPD and this may be an issue as he may not have breath-holding capacity for dry powder inhalers.  He has not shown airway reversibility on prior PFTs.  Dyspnea is at baseline is moderate to severe.  He has not had any fevers, chills or sweats.  Cough is at baseline mostly with sputum production in the mornings.  Usually with a mild amount of "phlegm" without distinct coloration to it.  No hemoptysis.  No orthopnea or paroxysmal nocturnal dyspnea.  No lower extremity edema.  No calf tenderness.  He has had issues with multiple evanescent lung opacities that have been biopsied previously by ENB (Leslie Sims).  He is to have repeat CT scan 02 November 2019.  Current Meds  Medication Sig   albuterol (PROVENTIL HFA;VENTOLIN HFA) 108 (90 Base) MCG/ACT inhaler Inhale 2 puffs into the lungs every 6 (six) hours as needed for wheezing or shortness of breath.   aspirin 81 MG tablet Take 81 mg by mouth daily.     atorvastatin (LIPITOR) 40 MG tablet Take 1 tablet (40 mg total) by mouth daily.   Fluticasone-Salmeterol (ADVAIR) 500-50 MCG/DOSE AEPB Inhale 1 puff into the lungs every 12 (twelve) hours.   guaiFENesin-dextromethorphan (ROBITUSSIN DM) 100-10 MG/5ML syrup Take 5 mLs by mouth every 4 (four) hours as  needed for cough.   hydrOXYzine (VISTARIL) 25 MG capsule Take 25 mg by mouth 2 (two) times daily as needed for anxiety or itching.    LORazepam (ATIVAN) 1 MG tablet Take 1 tablet (1 mg total) by mouth every 4 (four) hours while awake.   losartan (COZAAR) 25 MG tablet Take 25 mg by mouth daily.   mirtazapine (REMERON) 30 MG tablet Take 1 tablet (30 mg total) by mouth at bedtime.   Multiple Vitamin (MULTIVITAMIN) capsule Take 1 capsule by mouth daily.   nitroGLYCERIN (NITROSTAT) 0.4 MG SL tablet Place 1 tablet (0.4 mg total) under the tongue every 5 (five) minutes as needed.   OLANZapine (ZYPREXA) 2.5 MG tablet Take 2.5 mg by mouth at bedtime.   OXYGEN Inhale 3 L into the lungs daily.    [DISCONTINUED] azithromycin (ZITHROMAX) 250 MG tablet Take 250 mg by mouth daily. Take 250 mg every Monday, Wednesday and Friday.   [DISCONTINUED] ipratropium-albuterol (DUONEB) 0.5-2.5 (3) MG/3ML SOLN Take 3 mLs by nebulization 4 (four) times daily. DX:J44.9   [DISCONTINUED] nicotine polacrilex (NICORETTE) 2 MG gum Take 1 each (2 mg total) by mouth as needed for smoking cessation.   [DISCONTINUED] sucralfate (CARAFATE) 1 GM/10ML suspension Take 10 mLs (1 g total) by mouth 4 (four) times daily -  with meals and at bedtime. (Patient not taking: Reported on 12/04/2019)   [DISCONTINUED] tiotropium (SPIRIVA) 18 MCG inhalation capsule Place 18 mcg into inhaler and inhale daily.    Review of Systems A 10 point review of systems was performed and it is as noted above otherwise negative.    Objective:  Physical Exam BP 126/74 (BP Location: Left Arm, Cuff Size: Normal)    Pulse 90    Temp 98.8 F (37.1 C) (Temporal)    Ht 5\' 10"  (1.778 m)    Wt 181 lb (82.1 kg)    SpO2 96%    BMI 25.97 kg/m  GENERAL: Well-developed somewhat overweight gentleman with accessory muscle use but no acute distress.  No conversational dyspnea. HEAD: Normocephalic, atraumatic.  EYES: Pupils equal, round, reactive to light.  No  scleral icterus.  MOUTH: Nose/mouth/throat not examined due to masking requirements for COVID 19. NECK: Supple. No thyromegaly. No nodules. No JVD.  Trachea midline, no crepitus. PULMONARY: Distant breath sounds, no adventitious sounds. CARDIOVASCULAR: S1 and S2. Regular rate and rhythm.  No rubs murmurs gallops heard. GASTROINTESTINAL: Protuberant abdomen, soft. MUSCULOSKELETAL: No joint deformity, no clubbing, no edema.  NEUROLOGIC: Awake and alert, no focal deficits noted.  Speech is fluent. SKIN: Intact,warm,dry.  Limited exam, no rashes. PSYCH: Normal mood normal behavior.  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 He is on maximized bronchodilator therapy Cannot feel dry powder inhalers are helping DuoNeb gives him the most relief Recommend using DuoNeb 4 times a day with consistent use Hold on Advair and Spiriva to see how he does, resume use of Advair if notes worsening Ipratropium and Spiriva are duplication of therapy Repeat PFTs Follow-up after pulmonary function testing performed Call sooner should any new difficulties arise  Chronic hypoxemic respiratory failure Has been compliant with oxygen therapy Continue O2 supplementation 2 L/min with rest, 4 L/min with exertion  Multiple pulmonary nodules Previously biopsied by ENB, negative Repeat CT chest 02 November 2019, will review Evanescent nature indicates likely inflammatory/chronic infectious issue (MAI?)  Former smoker Patient was commended on discontinuation of smoking and continued abstinence   C. Derrill Kay, MD [facility]  *This note was dictated using voice recognition software/Dragon.  Despite best efforts to proofread, errors can occur which can change the meaning.  Any change was purely unintentional.

## 2019-10-03 NOTE — Patient Instructions (Signed)
1.  Use your DuoNeb (nebulizer medication) 3-4 times a day, try at least to get 3 times a day consistently.  2.  To using your oxygen.  3.  We will schedule you for breathing tests.  4.  We will be looking for the results of your CT scan to be performed n November.  5.  We will see him in follow-up after the breathing tests  6.  Hold on Advair and Spiriva use and see how the DuoNeb works.  Let us know how that works for you.

## 2019-10-22 ENCOUNTER — Emergency Department: Payer: Medicare Other

## 2019-10-22 ENCOUNTER — Encounter: Payer: Self-pay | Admitting: Emergency Medicine

## 2019-10-22 ENCOUNTER — Emergency Department
Admission: EM | Admit: 2019-10-22 | Discharge: 2019-10-22 | Disposition: A | Discharge status: Home / Self Care | Payer: Medicare Other | Attending: Emergency Medicine | Admitting: Emergency Medicine

## 2019-10-22 ENCOUNTER — Other Ambulatory Visit: Payer: Self-pay

## 2019-10-22 DIAGNOSIS — Z79899 Other long term (current) drug therapy: Secondary | ICD-10-CM | POA: Insufficient documentation

## 2019-10-22 DIAGNOSIS — J449 Chronic obstructive pulmonary disease, unspecified: Secondary | ICD-10-CM | POA: Diagnosis not present

## 2019-10-22 DIAGNOSIS — I10 Essential (primary) hypertension: Secondary | ICD-10-CM | POA: Diagnosis not present

## 2019-10-22 DIAGNOSIS — Z87891 Personal history of nicotine dependence: Secondary | ICD-10-CM | POA: Diagnosis not present

## 2019-10-22 DIAGNOSIS — Z7982 Long term (current) use of aspirin: Secondary | ICD-10-CM | POA: Insufficient documentation

## 2019-10-22 DIAGNOSIS — I2510 Atherosclerotic heart disease of native coronary artery without angina pectoris: Secondary | ICD-10-CM | POA: Insufficient documentation

## 2019-10-22 DIAGNOSIS — R0602 Shortness of breath: Secondary | ICD-10-CM | POA: Diagnosis not present

## 2019-10-22 DIAGNOSIS — Z9981 Dependence on supplemental oxygen: Secondary | ICD-10-CM | POA: Insufficient documentation

## 2019-10-22 IMAGING — CR DG CHEST 2V
2 series · 2 of 2 positions shown · non-contrast
Comparison: [DATE] chest radiograph, CT chest [DATE]

CLINICAL DATA: Shortness of breath for a week, history coronary
artery disease post STEMI, COPD, hypertension, heart failure,
ischemic cardiomyopathy

EXAM:
CHEST - 2 VIEW

[chest pa]
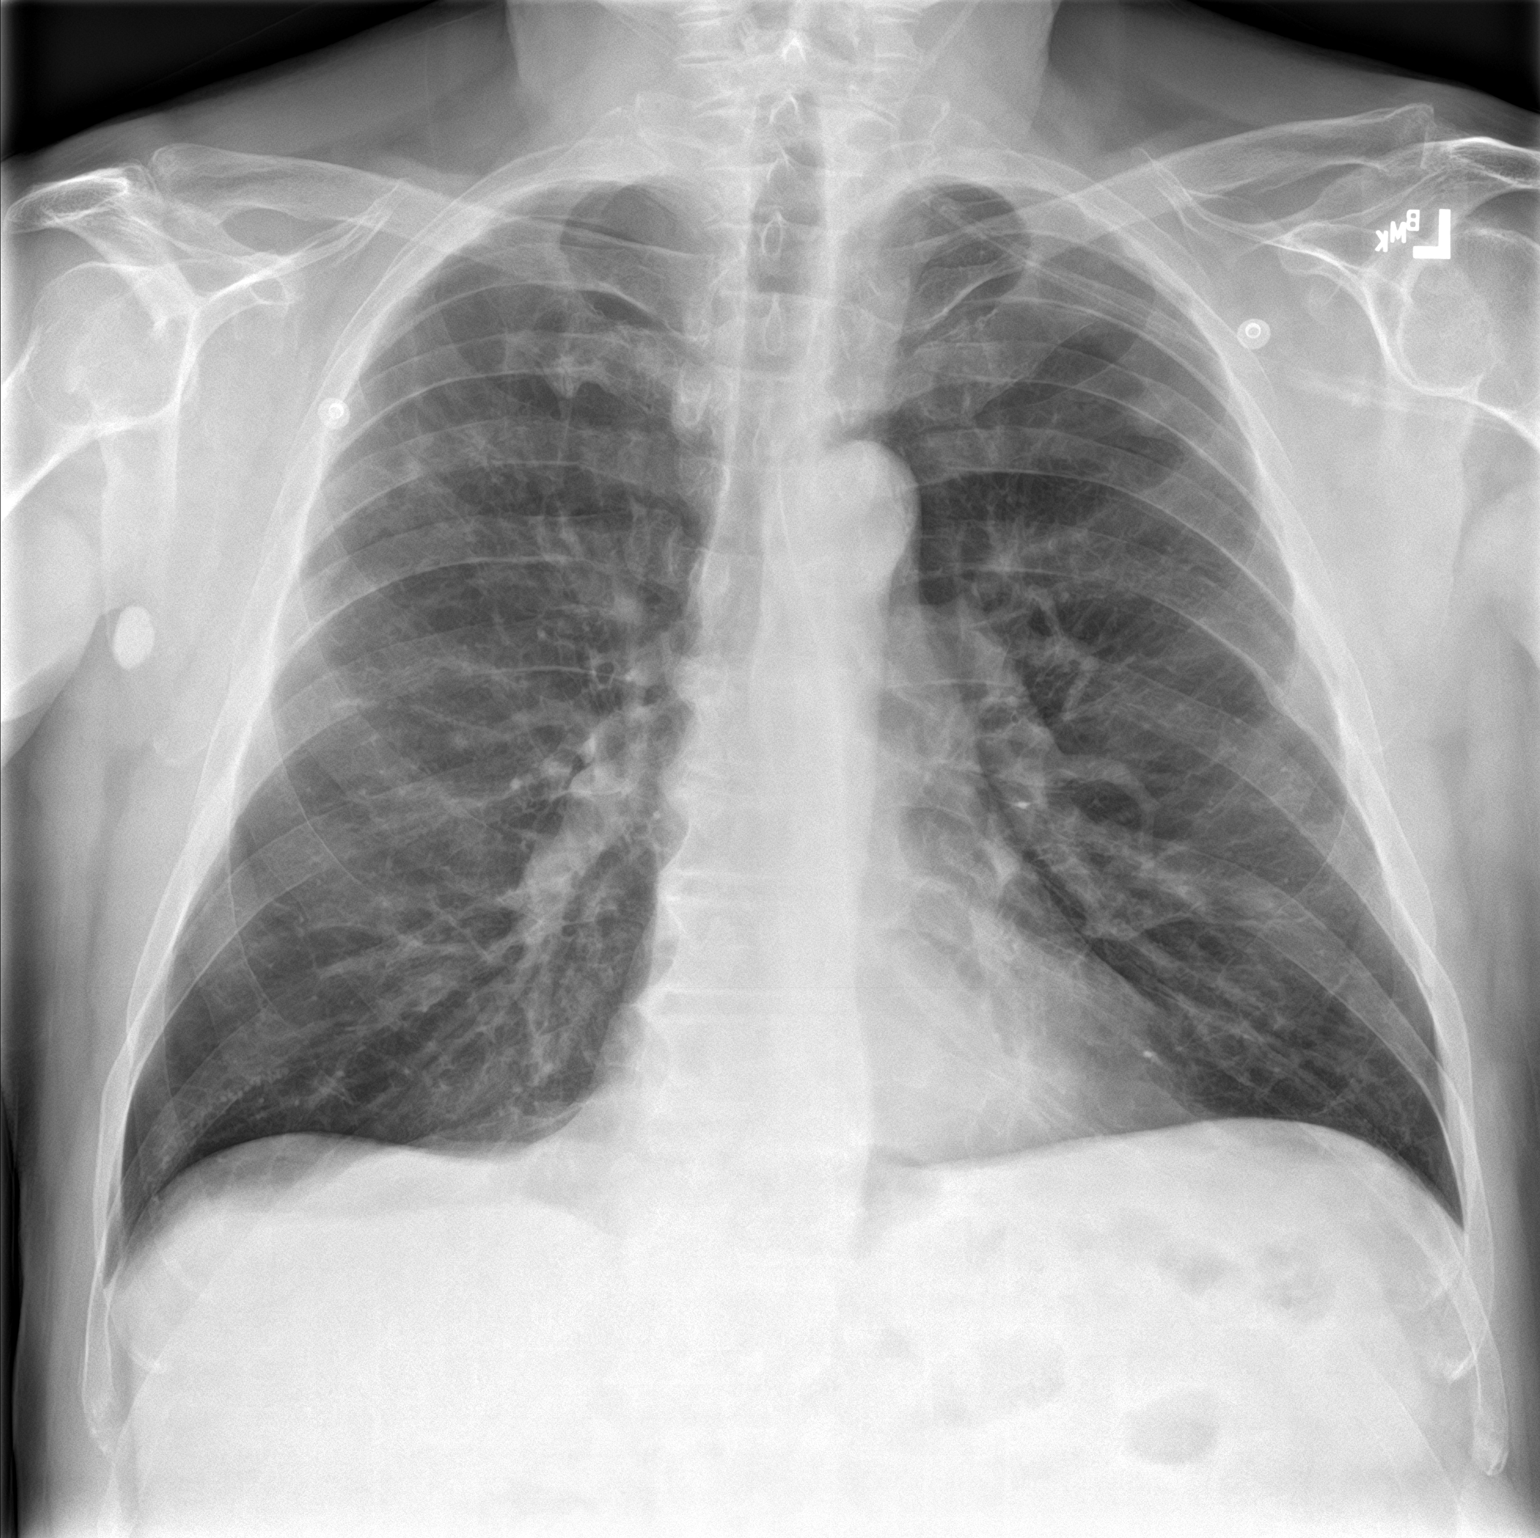

[chest lat]
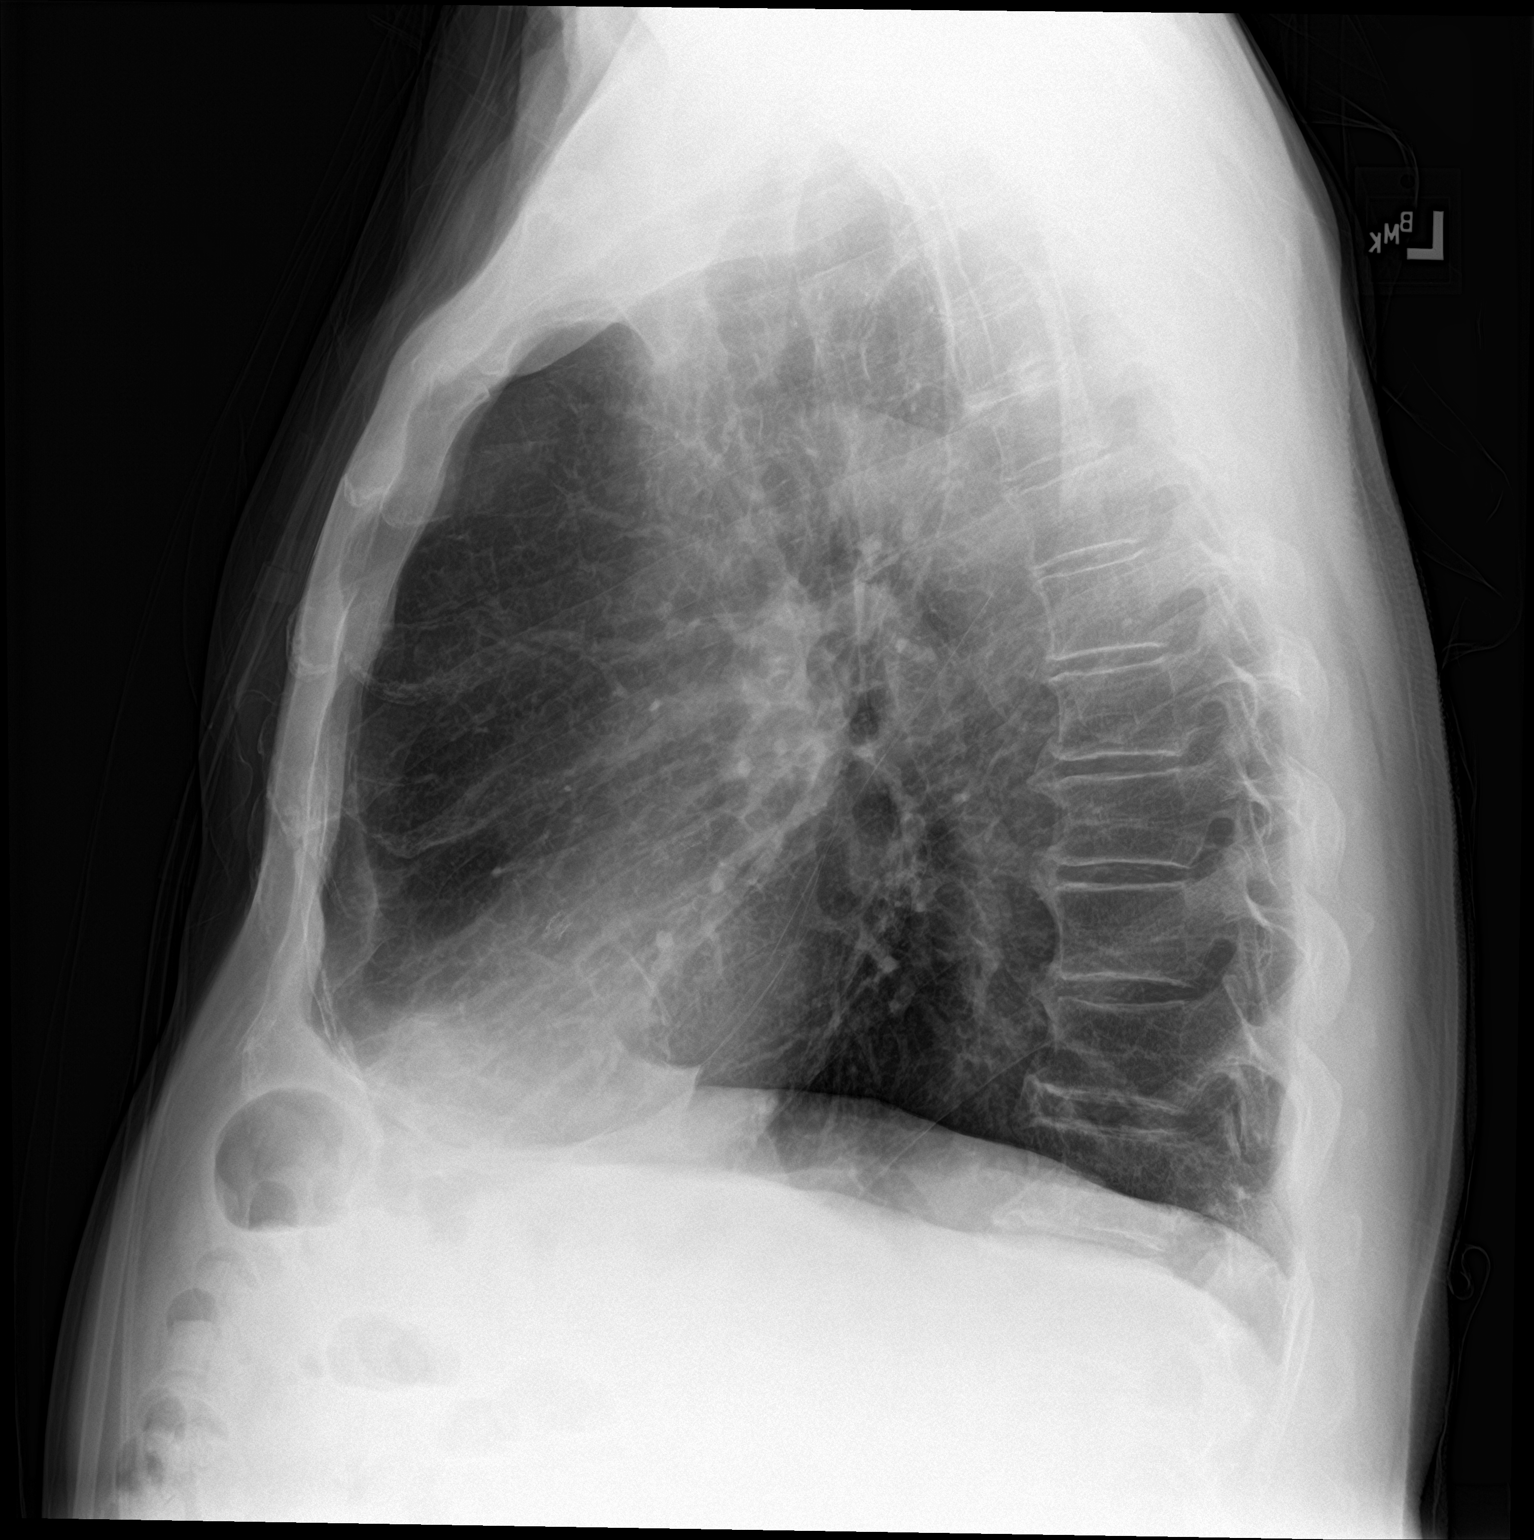

[2 of 2 positions shown; findings below may reference images not displayed]

FINDINGS: Normal heart size, mediastinal contours, and pulmonary vascularity.

Improved opacity in the RIGHT upper lobe likely representing
improving infiltrate.

Unchanged appearance of 11 mm LEFT upper lobe nodule, corresponding
to nodular density on CT.

Underlying emphysematous and bronchitic changes consistent with
COPD.

No new areas of consolidation, pleural effusion, or pneumothorax.

Bones demineralized.
IMPRESSION: Persistent nodular focus at the LEFT upper lobe with improved RIGHT
upper lobe infiltrate.

Underlying emphysematous changes.

Recommend continued follow-up to demonstrate long-term stability of
the LEFT upper lobe nodule.

## 2019-10-22 MED ORDER — IPRATROPIUM-ALBUTEROL 0.5-2.5 (3) MG/3ML IN SOLN
3.0000 mL | Freq: Once | RESPIRATORY_TRACT | Status: AC
  Administered 2019-10-22: 20:00:00 3 mL via RESPIRATORY_TRACT
  Filled 2019-10-22: qty 3

## 2019-10-22 MED ORDER — LORAZEPAM 1 MG PO TABS
1.0000 mg | ORAL_TABLET | Freq: Once | ORAL | Status: AC
  Administered 2019-10-22: 1 mg via ORAL
  Filled 2019-10-22: qty 1

## 2019-10-22 NOTE — ED Provider Notes (Addendum)
[facility] Emergency Department Provider Note   ____________________________________________    I have reviewed the triage vital signs and the nursing notes.   HISTORY  Chief Complaint Panic Attack     HPI Leslie Sims is a 68 y.o. male with a history of COPD who is on 2.5 L at home who presents today with shortness of breath/anxiety, now resolved.  Patient reports that recently treated for COPD exacerbation, he notes that he gets lonely at home ever since his wife died 2 years ago when he struggles from anxiety.  Spent the last several days at home treating his COPD exacerbation and was feeling okay until he attempted to call his daughters today and they would not answer, he got frustrated and became "worked up "and started feeling very short of breath.  Called EMS by the time he got there he was feeling better.  No chest pain.  No fevers or chills.  No SI  Past Medical History:  Diagnosis Date  . Allergic rhinitis   . Anxiety   . Back pain   . Colon polyps 08/2012   colonoscopy; multiple colon polyps; repeat colonoscopy in one year.  [name] COPD (chronic obstructive pulmonary disease) ([place])    a. on home O2.  . Coronary artery disease 11/2009   a. late presenting ant MI; b. LHC 100% pLAD s/p PCI/DES, 99% mRCA s/p PCI/DES, EF 35%; c. nuclear stress test 05/13: prior ant/inf infarcts w/o ischemia, EF 43%; d. LHC 02/14: widely patent stents with no other obs dz, EF 40%; e. LHC 11/17: LM nl, mLAD 10%, patent LAD stent, dLAD 20%, p-mRCA 10%, mRCA 40%, patent RCA stent, EF 35-45%   . Depression   . GERD (gastroesophageal reflux disease)   . Headache(784.0)   . Helicobacter pylori (H. pylori)   . Hemorrhoids   . Hernia    inguinal  . HFrEF (heart failure with reduced ejection fraction) ([place])    a. 10/2016 LV gram: EF 35-45%; b. 06/2018 Echo: EF 40-45%.  . Hyperlipidemia   . Hypertension   . Insomnia   . Ischemic cardiomyopathy    a. 10/2016 LV  gram: EF 35-45%; b. 06/2018 Echo: EF 40-45%.  . Myalgia   . Peptic ulcer   . Pulmonary nodules   . ST elevation (STEMI) myocardial infarction involving left anterior descending coronary artery ([place]) 11/2009   a. s/p PCI to the LAD  . Status post dilation of esophageal narrowing 2000  . Tinea pedis   . Wears dentures    partial upper    Patient Active Problem List   Diagnosis Date Noted  . Goals of care, counseling/discussion   . Palliative care by specialist   . Healthcare-associated pneumonia 01/28/2019  . COPD with acute exacerbation ([place]) 12/31/2018  . Acute respiratory failure with hypoxia ([place]) 09/29/2018  . Acute respiratory failure with hypoxia and hypercapnia ([place]) 09/13/2018  . Acute on chronic respiratory failure with hypoxia and hypercapnia ([place]) 08/24/2018  . Chest pain 07/02/2018  . Grief reaction 01/13/2018  . COPD with hypoxia ([place]) 12/25/2017  . Unstable angina ([place])   . Benign neoplasm of ascending colon   . Benign neoplasm of descending colon   . Benign neoplasm of sigmoid colon   . Personal history of colonic polyps   . Pulmonary nodules/lesions, multiple 01/05/2015  . Bruit of left carotid artery 11/04/2014  . Sleep disorder 07/18/2013  . Seasonal and perennial allergic rhinitis 05/29/2013  . Bradycardia 04/20/2011  . SMOKER  07/30/2010  . CAD, NATIVE VESSEL 04/23/2010  . Hyperlipidemia 03/24/2010  . DEPRESSION/ANXIETY 03/24/2010  . Chronic systolic heart failure ([place]) 03/24/2010  . COPD, very severe ([place]) 03/24/2010  . GERD 03/24/2010  . Essential hypertension 11/21/2009    Past Surgical History:  Procedure Laterality Date  . Admission  12/20/2012   COPD exacerbation.  [place].  [name] CARDIAC CATHETERIZATION    . CARDIAC CATHETERIZATION  02/05/13   [facility]  . CARDIAC CATHETERIZATION  10/14   [facility] : patent stents with no change in anatomy. EF: 40$  . CARDIAC CATHETERIZATION Left 11/12/2016   Procedure: Left Heart Cath and Coronary Angiography;  Surgeon:  Wellington Hampshire, MD;  Location: [place] [facility];  Service: Cardiovascular;  Laterality: Left;  . COLONOSCOPY    . COLONOSCOPY WITH PROPOFOL N/A 05/18/2016   Procedure: COLONOSCOPY WITH PROPOFOL;  Surgeon: Lucilla Lame, MD;  Location: [facility] ENDOSCOPY;  Service: Endoscopy;  Laterality: N/A;  . CORONARY ANGIOPLASTY WITH STENT PLACEMENT  09/2009   LAD 3.0 X23 mm Xience DES, RCA: 4.0 X 15 mm Xience DES  . ELECTROMAGNETIC NAVIGATION BROCHOSCOPY N/A 10/27/2017   Procedure: ELECTROMAGNETIC NAVIGATION BRONCHOSCOPY;  Surgeon: Flora Lipps, MD;  Location: [facility] ORS;  Service: Cardiopulmonary;  Laterality: N/A;  . ESOPHAGEAL DILATION    . ESOPHAGOGASTRODUODENOSCOPY  2008  . ESOPHAGOGASTRODUODENOSCOPY  08/20/2012  . HERNIA REPAIR  07/20/2012   L inguinal hernia repair  . PENILE PROSTHESIS IMPLANT      Prior to Admission medications   Medication Sig Start Date End Date Taking? Authorizing Provider  albuterol (PROVENTIL HFA;VENTOLIN HFA) 108 (90 Base) MCG/ACT inhaler Inhale 2 puffs into the lungs every 6 (six) hours as needed for wheezing or shortness of breath. 11/01/18   Rudene Re, MD  aspirin 81 MG tablet Take 81 mg by mouth daily.      [provider]  atorvastatin (LIPITOR) 40 MG tablet Take 1 tablet (40 mg total) by mouth daily. 03/03/18   [name], MD  azithromycin (ZITHROMAX) 250 MG tablet Take 250 mg by mouth daily. Take 250 mg every Monday, Wednesday and Friday.    [provider]  Fluticasone-Salmeterol (ADVAIR) 500-50 MCG/DOSE AEPB Inhale 1 puff into the lungs every 12 (twelve) hours. 11/29/18   [provider]  furosemide (LASIX) 20 MG tablet Take 1 tablet (20 mg total) by mouth daily. 07/07/18 09/30/19  [name], MD  guaiFENesin-dextromethorphan (ROBITUSSIN DM) 100-10 MG/5ML syrup Take 5 mLs by mouth every 4 (four) hours as needed for cough. 01/31/19   [name], MD  hydrOXYzine (VISTARIL) 25 MG capsule Take 25 mg by mouth 2 (two) times daily as  needed for anxiety or itching.  06/05/18   [provider]  ipratropium-albuterol (DUONEB) 0.5-2.5 (3) MG/3ML SOLN Take 3 mLs by nebulization 4 (four) times daily. DX:J44.9 12/04/18   Wilhelmina Mcardle, MD  LORazepam (ATIVAN) 1 MG tablet Take 1 tablet (1 mg total) by mouth every 4 (four) hours while awake. 02/14/19   [name], MD  losartan (COZAAR) 25 MG tablet Take 25 mg by mouth daily. 02/23/19   [provider]  mirtazapine (REMERON) 30 MG tablet Take 1 tablet (30 mg total) by mouth at bedtime. 06/24/18   [name], MD  Multiple Vitamin (MULTIVITAMIN) capsule Take 1 capsule by mouth daily.    [provider]  nitroGLYCERIN (NITROSTAT) 0.4 MG SL tablet Place 1 tablet (0.4 mg total) under the tongue every 5 (five) minutes as needed. 10/17/17   Fletcher Anon,  Mertie Clause, MD  OLANZapine (ZYPREXA) 2.5 MG tablet Take 2.5 mg by mouth at bedtime. 01/19/19   [provider]  OXYGEN Inhale 3 L into the lungs daily.     [provider]  sucralfate (CARAFATE) 1 GM/10ML suspension Take 10 mLs (1 g total) by mouth 4 (four) times daily -  with meals and at bedtime. Patient taking differently: Take 1 g by mouth 4 (four) times daily as needed.  05/16/18   [name], MD  tiotropium (SPIRIVA) 18 MCG inhalation capsule Place 18 mcg into inhaler and inhale daily.    [provider]     Allergies Prozac [fluoxetine hcl], Effexor xr [venlafaxine hcl er], and Wellbutrin [bupropion]  Family History  Problem Relation Age of Onset  . Heart attack Brother        Brother #1  . Diabetes Brother   . Hypertension Brother        #3  . Coronary artery disease Father 62       deceased  . Heart attack Father   . Diabetes Father   . Heart disease Father   . COPD Mother 4       deceased  . Alcohol abuse Sister        polysubstance abuse  . COPD Sister   . Lung cancer Sister   . Alcohol abuse Sister        polysubstance abuse  . Penile cancer Brother   .  Diabetes Brother     Social History Social History   Tobacco Use  . Smoking status: Former Smoker    Packs/day: 1.00    Years: 42.00    Pack years: 42.00    Types: Cigarettes    Quit date: 02/10/2019    Years since quitting: 0.6  . Smokeless tobacco: Never Used  Substance Use Topics  . Alcohol use: No    Alcohol/week: 0.0 standard drinks  . Drug use: No    Review of Systems  Constitutional: No fever/chills Eyes: No visual changes.  ENT: No sore throat. Cardiovascular: Denies chest pain. Respiratory: As above Gastrointestinal: No abdominal pain.  No nausea, no vomiting.   Genitourinary: Negative for dysuria. Musculoskeletal: Negative for back pain. Skin: Negative for rash. Neurological: Negative for headaches or weakness   ____________________________________________   PHYSICAL EXAM:  VITAL SIGNS: ED Triage Vitals  Enc Vitals Group     BP 10/22/19 1757 123/85     Pulse Rate 10/22/19 1757 91     Resp 10/22/19 1757 18     Temp 10/22/19 1757 98.2 F (36.8 C)     Temp Source 10/22/19 1757 Oral     SpO2 10/22/19 1757 99 %     Weight 10/22/19 1758 82.1 kg (181 lb)     Height 10/22/19 1758 1.778 m (5\' 10" )     Head Circumference --      Peak Flow --      Pain Score 10/22/19 1758 0     Pain Loc --      Pain Edu? --      Excl. in [place]? --     Constitutional: Alert and oriented.   Mouth/Throat: Mucous membranes are moist.   Neck:  Painless ROM Cardiovascular: Normal rate, regular rhythm. Grossly normal heart sounds.  Good peripheral circulation. Respiratory: Normal respiratory effort, scattered mild wheezes Gastrointestinal: Soft and nontender. No distention. . Genitourinary: deferred Musculoskeletal:.  Warm and well perfused Neurologic:  Normal speech and language. No gross focal neurologic deficits are  appreciated.  Skin:  Skin is warm, dry and intact. No rash noted. Psychiatric: Mood and affect are normal. Speech and behavior are normal.   ____________________________________________   LABS (all labs ordered are listed, but only abnormal results are displayed)  Labs Reviewed - No data to display ____________________________________________  EKG  ED ECG REPORT I, Lavonia Drafts, the attending physician, personally viewed and interpreted this ECG.  Date: 11/17/2019  Rhythm: normal sinus rhythm QRS Axis: normal Intervals: normal ST/T Wave abnormalities: Nonspecific T wave changes Narrative Interpretation: no evidence of acute ischemia  ____________________________________________  RADIOLOGY  Chest x-ray persistent nodular focus of the left, recommended follow-up ____________________________________________   PROCEDURES  Procedure(s) performed: No  Procedures   Critical Care performed: No ____________________________________________   INITIAL IMPRESSION / ASSESSMENT AND PLAN / ED COURSE  Pertinent labs & imaging results that were available during my care of the patient were reviewed by me and considered in my medical decision making (see chart for details).  Patient presents with shortness of breath, now resolved, he strongly feels this is related to an anxiety attack which he suffers from since the death of his wife 2-1/2 years ago.  His exam is quite reassuring, noted chest x-ray nodule to the patient.  Treated with DuoNeb as well as p.o. Ativan  He is feeling much better, remained stable with normal vitals, appropriate for discharge      ____________________________________________   FINAL CLINICAL IMPRESSION(S) / ED DIAGNOSES  Final diagnoses:  Shortness of breath        Note:  This document was prepared using Dragon voice recognition software and may include unintentional dictation errors.   Lavonia Drafts, MD 10/22/19 2050    Lavonia Drafts, MD 11/17/19 1016

## 2019-10-22 NOTE — ED Triage Notes (Signed)
Pt in via EMS from home with c/o trouble breathing. EMS reports 98% on his 2L of O2, 95-100HR, #20 left AC, BP 122/71. EMS reports administered one breathing treatment with relief. EMS reports pt with a lot of anxiety since his wife dies and was a bit anxious on scene.

## 2019-10-22 NOTE — ED Triage Notes (Signed)
Pt here via EMS from home with c/o shob that began about a week ago. Is on home O2 at 2.5L [place]. Pt states anxiety and depression and the shob has made everything more difficult. Per EMS, pt was anxious on scene, however, now pt states he is much more calm. NAD. 98%2.5L [place].

## 2019-10-22 NOTE — ED Notes (Signed)
Pt admits he believes this attack was more anxiety than anything. Pt is calm and 99% on 2L at this time.

## 2019-11-02 ENCOUNTER — Other Ambulatory Visit: Payer: Self-pay

## 2019-11-02 ENCOUNTER — Ambulatory Visit
Admission: RE | Admit: 2019-11-02 | Discharge: 2019-11-02 | Disposition: A | Discharge status: Home / Self Care | Payer: Medicare Other | Source: Ambulatory Visit | Attending: Hematology and Oncology | Admitting: Hematology and Oncology

## 2019-11-02 ENCOUNTER — Inpatient Hospital Stay: Payer: Medicare Other | Attending: Hematology and Oncology

## 2019-11-02 ENCOUNTER — Encounter: Payer: Self-pay | Admitting: Hematology and Oncology

## 2019-11-02 DIAGNOSIS — J449 Chronic obstructive pulmonary disease, unspecified: Secondary | ICD-10-CM | POA: Diagnosis not present

## 2019-11-02 DIAGNOSIS — I10 Essential (primary) hypertension: Secondary | ICD-10-CM | POA: Insufficient documentation

## 2019-11-02 DIAGNOSIS — Z9981 Dependence on supplemental oxygen: Secondary | ICD-10-CM | POA: Insufficient documentation

## 2019-11-02 DIAGNOSIS — I2510 Atherosclerotic heart disease of native coronary artery without angina pectoris: Secondary | ICD-10-CM | POA: Insufficient documentation

## 2019-11-02 DIAGNOSIS — Z801 Family history of malignant neoplasm of trachea, bronchus and lung: Secondary | ICD-10-CM | POA: Insufficient documentation

## 2019-11-02 DIAGNOSIS — R918 Other nonspecific abnormal finding of lung field: Secondary | ICD-10-CM | POA: Insufficient documentation

## 2019-11-02 DIAGNOSIS — Z87891 Personal history of nicotine dependence: Secondary | ICD-10-CM | POA: Diagnosis not present

## 2019-11-02 DIAGNOSIS — Z8049 Family history of malignant neoplasm of other genital organs: Secondary | ICD-10-CM | POA: Insufficient documentation

## 2019-11-02 DIAGNOSIS — R911 Solitary pulmonary nodule: Secondary | ICD-10-CM

## 2019-11-02 LAB — COMPREHENSIVE METABOLIC PANEL
ALT: 26 U/L (ref 0–44)
AST: 23 U/L (ref 15–41)
Albumin: 4.2 g/dL (ref 3.5–5.0)
Alkaline Phosphatase: 62 U/L (ref 38–126)
Anion gap: 10 (ref 5–15)
BUN: 17 mg/dL (ref 8–23)
CO2: 34 mmol/L — ABNORMAL HIGH (ref 22–32)
Calcium: 9.2 mg/dL (ref 8.9–10.3)
Chloride: 97 mmol/L — ABNORMAL LOW (ref 98–111)
Creatinine, Ser: 1.04 mg/dL (ref 0.61–1.24)
GFR calc Af Amer: >60 mL/min (ref >60)
GFR calc non Af Amer: >60 mL/min (ref >60)
Glucose, Bld: 164 mg/dL — ABNORMAL HIGH (ref 70–99)
Potassium: 4 mmol/L (ref 3.5–5.1)
Sodium: 141 mmol/L (ref 135–145)
Total Bilirubin: 0.5 mg/dL (ref 0.3–1.2)
Total Protein: 7.1 g/dL (ref 6.5–8.1)

## 2019-11-02 LAB — CBC WITH DIFFERENTIAL/PLATELET
Abs Immature Granulocytes: 0.04 10*3/uL (ref 0.00–0.07)
Basophils Absolute: 0 10*3/uL (ref 0.0–0.1)
Basophils Relative: 1 %
Eosinophils Absolute: 0.2 10*3/uL (ref 0.0–0.5)
Eosinophils Relative: 3 %
HCT: 40.8 % (ref 39.0–52.0)
Hemoglobin: 12.9 g/dL — ABNORMAL LOW (ref 13.0–17.0)
Immature Granulocytes: 1 %
Lymphocytes Relative: 21 %
Lymphs Abs: 1.5 10*3/uL (ref 0.7–4.0)
MCH: 28.9 pg (ref 26.0–34.0)
MCHC: 31.6 g/dL (ref 30.0–36.0)
MCV: 91.5 fL (ref 80.0–100.0)
Monocytes Absolute: 0.8 10*3/uL (ref 0.1–1.0)
Monocytes Relative: 11 %
Neutro Abs: 4.5 10*3/uL (ref 1.7–7.7)
Neutrophils Relative %: 63 %
Platelets: 199 10*3/uL (ref 150–400)
RBC: 4.46 MIL/uL (ref 4.22–5.81)
RDW: 12.2 % (ref 11.5–15.5)
WBC: 7.1 10*3/uL (ref 4.0–10.5)
nRBC: 0 % (ref 0.0–0.2)

## 2019-11-02 IMAGING — CT CT CHEST W/O CM
1 of 2 series · 14 of 32 positions shown, 18 images · non-contrast
Comparison: Chest CT [DATE].

CLINICAL DATA: 68-year-old male with history of pulmonary nodule.
Follow-up study.

EXAM:
CT CHEST WITHOUT CONTRAST
TECHNIQUE: Multidetector CT imaging of the chest was performed following the
standard protocol without IV contrast.

[Series 2: thorax · axial · 0.78mm/px · z∈[-394,-110]mm · 14 of 168 slices shown, 18 images]
[im 13/168  mediastinal]
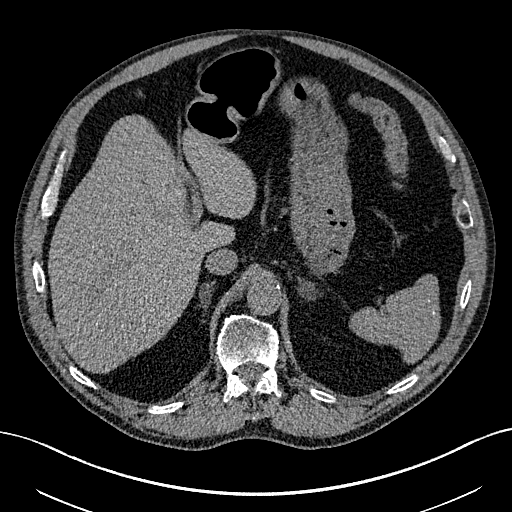
[im 13/168  lung]
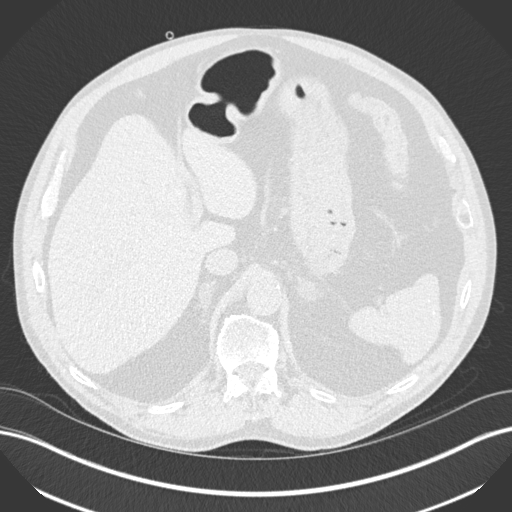
[im 26/168  lung]
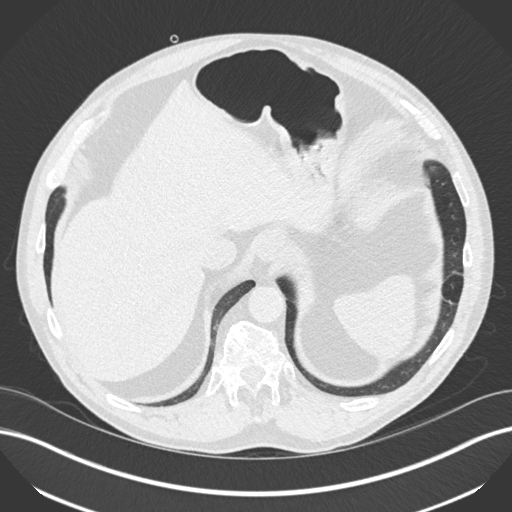
[im 39/168  lung]
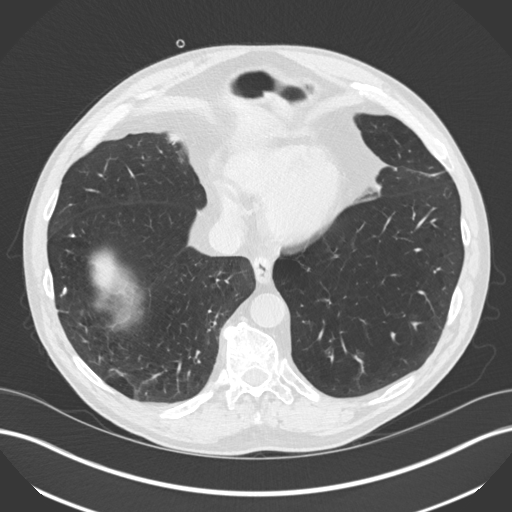
[im 52/168  lung]
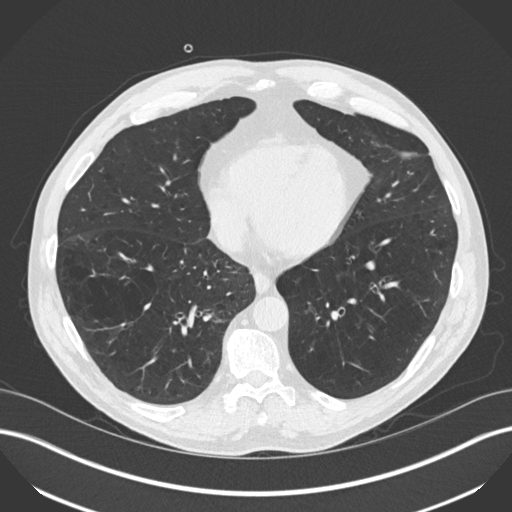
[im 65/168  mediastinal]
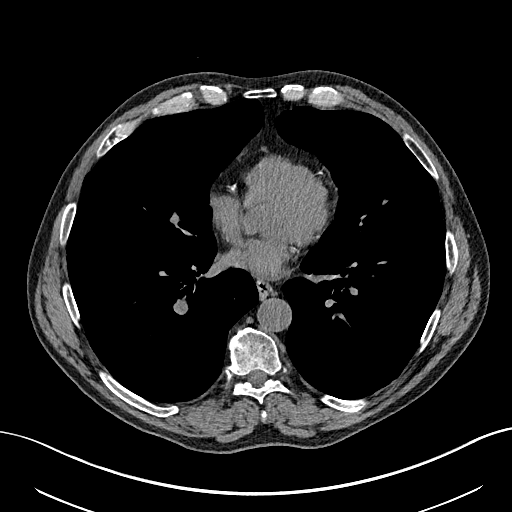
[im 65/168  lung]
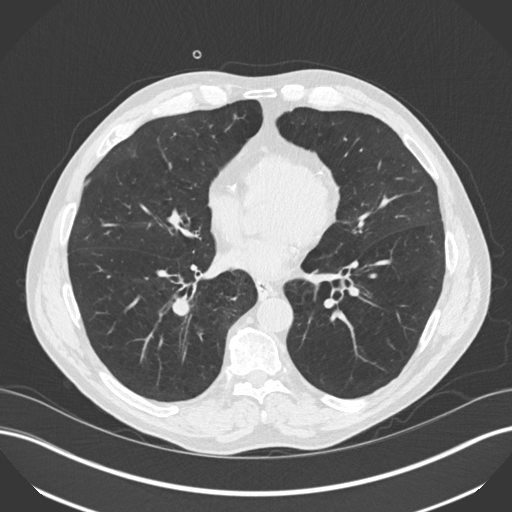
[im 78/168  lung]
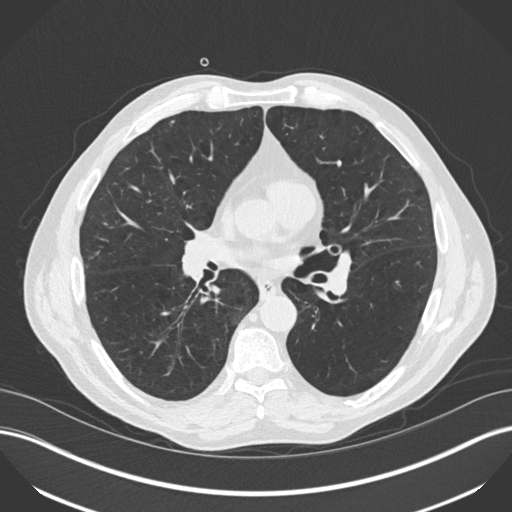
[im 80/168  lung]
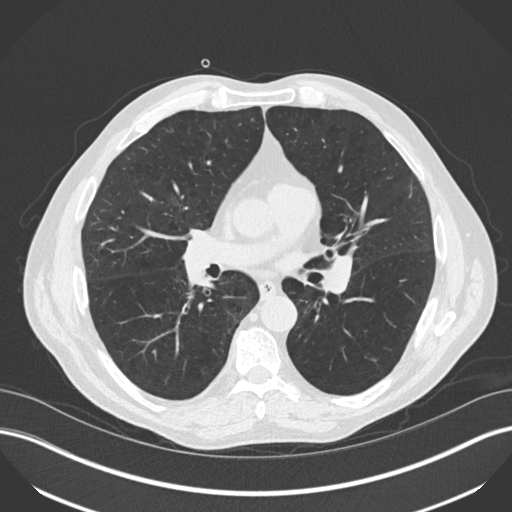
[im 84/168  lung]
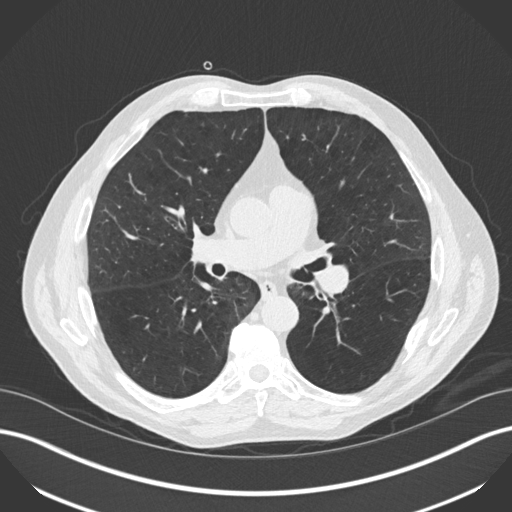
[im 90/168  mediastinal]
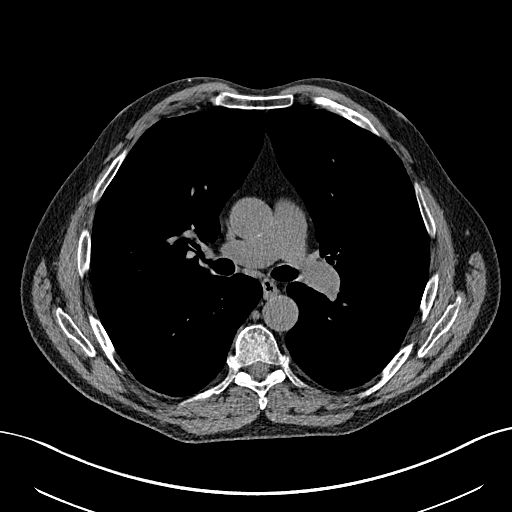
[im 90/168  lung]
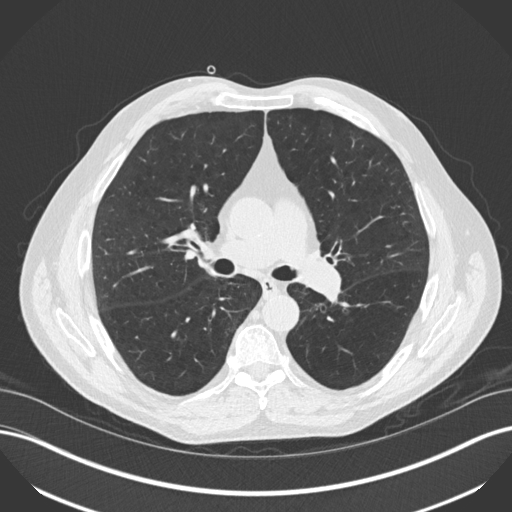
[im 103/168  lung]
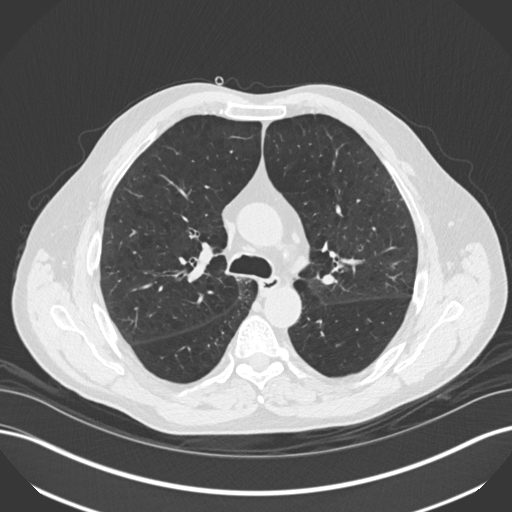
[im 116/168  lung]
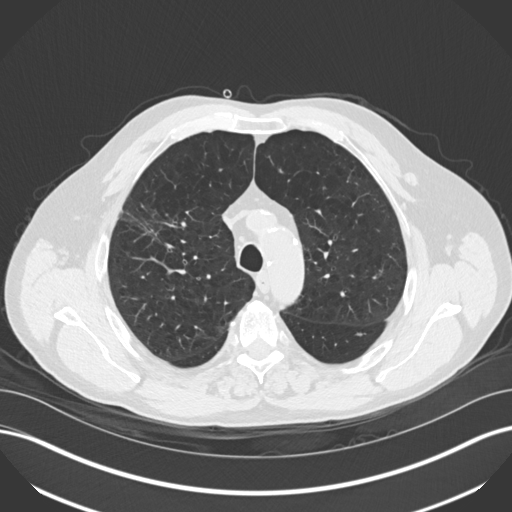
[im 129/168  lung]
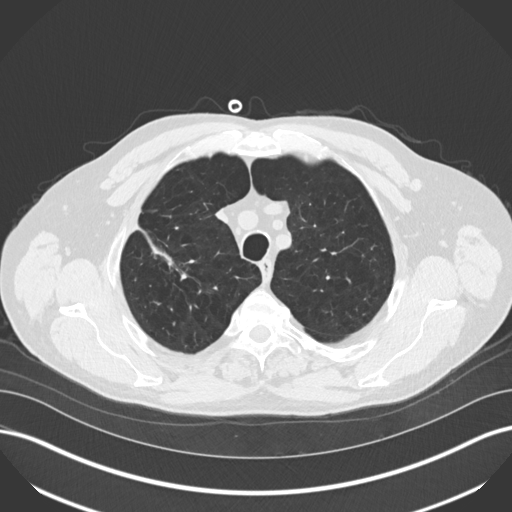
[im 142/168  mediastinal]
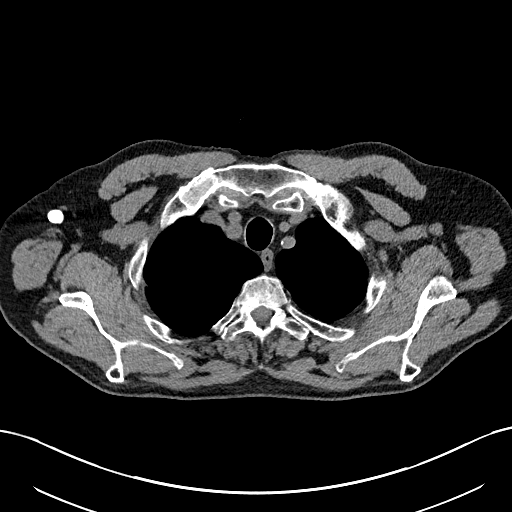
[im 142/168  lung]
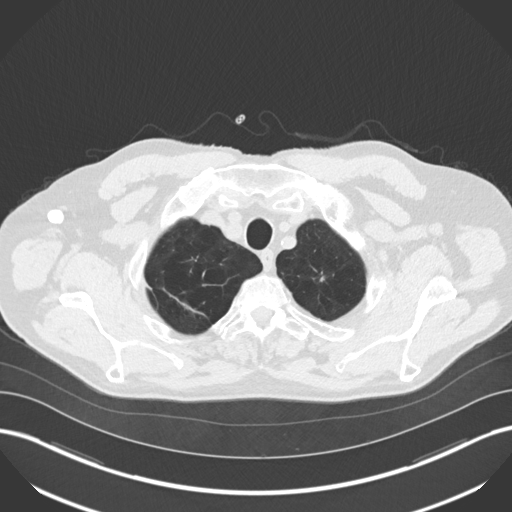
[im 155/168  lung]
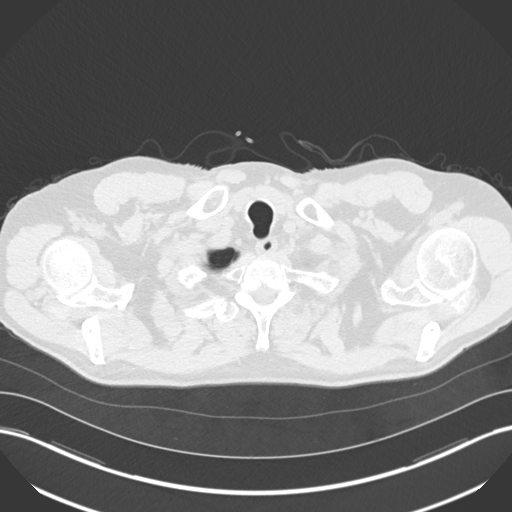

[14 of 32 positions shown; findings below may reference images not displayed]

FINDINGS: Cardiovascular: Heart size is normal. There is no significant
pericardial fluid, thickening or pericardial calcification. There is
aortic atherosclerosis, as well as atherosclerosis of the great
vessels of the mediastinum and the coronary arteries, including
calcified atherosclerotic plaque in the left main, left anterior
descending, left circumflex and right coronary arteries. Extensive
low-attenuation in the myocardium throughout the mid to distal LAD
territory.

Mediastinum/Nodes: No pathologically enlarged mediastinal or hilar
lymph nodes. Please note that accurate exclusion of hilar adenopathy
is limited on noncontrast CT scans. Esophagus is unremarkable in
appearance. No axillary lymphadenopathy.

Lungs/Pleura: When compared to the prior examination, the multiple
larger pulmonary nodules and mass-like areas seen on the prior study
have largely regressed. This is best demonstrated by the largest
lesion on the prior examination which is significantly smaller
currently measuring 3.5 x 1.3 cm in the right upper lobe (axial
image 36 of series 3), previously 4.3 x 1.9 cm and far more bulky.
The other dominant nodule in the right upper lobe posteriorly which
measured 17 x 11 mm on the prior study is now a small ill-defined 4
mm nodule (axial image 51 of series 3). Several calcified granulomas
are again noted. Multiple other smaller pulmonary nodules are also
noted, stable compared to the prior examination. No acute
consolidative airspace disease. No pleural effusions. Diffuse
bronchial wall thickening with moderate centrilobular and paraseptal
emphysema.

Upper Abdomen: Aortic atherosclerosis. Right adrenal nodule
measuring 1.7 x 1.3 cm is low-attenuation (-13 HU). Low-attenuation
(-4 HU) left adrenal nodule measuring 2.7 x 2.6 cm.

Musculoskeletal: There are no aggressive appearing lytic or blastic
lesions noted in the visualized portions of the skeleton.
IMPRESSION: 1. The vast majority of previously noted pulmonary nodules are
stable. The largest of the previously noted pulmonary nodules in the
right upper lobe on the prior examination have regressed
significantly compared to the prior study, indicative of a resolving
infectious/inflammatory process.
2. Diffuse bronchial wall thickening with moderate centrilobular and
paraseptal emphysema; imaging findings suggestive of underlying
COPD.
3. Aortic atherosclerosis, in addition to left main and 3 vessel
coronary artery disease. Low-attenuation in the myocardium
throughout the mid to distal LAD territory compatible with
fibrofatty metaplasia from prior LAD territory myocardial
infarction(s).
4. Bilateral adrenal adenomas.

Aortic Atherosclerosis ([LJ]-[LJ]).

## 2019-11-02 NOTE — Progress Notes (Signed)
The patient state his COPD is acting up and states he is going over to Urgent care when he is done here today.  The patient Name and DOB has been verified by phone.

## 2019-11-04 NOTE — Progress Notes (Signed)
[facility]  [address], [address] [place], [place] [address] Phone: [phone]  Fax: [phone]   Clinic Day:  11/05/2019  Referring physician: [name], MD  Chief Complaint: Leslie Sims is a 68 y.o. male with bilateral pulmonary nodules who is seen for assessment after interval chest CT.   HPI: The patient was last seen in the medical oncology clinic on 05/03/2019. At that time, he felt fatigued.  He is on 3 liters/min of oxygen.  Exam revealed wheezes in the RLL.  Tumor board discussion on 05/10/2019 recommended surveillance and re-scans every 6-12 months.  He was seen for chronic hypoxemic respiratory failure by Dr. [name] on 05/24/2019. As he had quit smoking, there was consideration of more aggressive evaluation of lung opacities and recheck of PFTs. He had follow up in 4 months with Dr. Patsey Berthold.  He was seen for chest pain by Dr Damian Leavell at [facility] in [place] on 09/20/2019.  He notes 5 days of fatigue and shortness of breath.  CXR and labs were normal.  He was felt to have anxiety.  She was seen for SOB and wheezing by Walker Kehr, NP on 10/17/2019. He stated if felt exactly like his COPD. He had been around some mold and mildew. He was told to increase fluids and take doxycycline and prednisone and continue inhalers and nebulizer's    He was seen in [facility] ER for SOB due to anxiety on 10/22/2019. CXR showed persistent nodular focus at the left upper lobe with improved right upper lobe infiltrate. There were underlying emphysematous changes.  Chest CT on 11/02/2019 revealed the vast majority of previously noted pulmonary nodules were stable. The largest of the previously noted pulmonary nodules in the right upper lobe had regressed significantly compared to the prior study, indicative of a resolving infectious/inflammatory process. There was diffuse bronchial wall thickening with moderate centrilobular and paraseptal  emphysema.  Imaging findings were suggestive of underlying COPD.  There were bilateral adrenal adenomas, aortic atherosclerosis,left main and 3 vessel coronary artery disease.  There was low-attenuation in the myocardium throughout the mid to distal LAD territory compatible with fibrofatty metaplasia from prior LAD territory myocardial infarction(s).  Labs on 11/02/2019 revealed hematocrit 40.8, hemoglobin 12.9, MCV 91.5, platelets 199,000, WBC 7100, and ANC 4500.   During the interim, he describes being on 2.5 L/min of oxygen when he is at home.  When he has to wear his mask, he wears 4 L/min oxygen.  He has wheezing spells every once in a while. He is unsure of who his pulmonologist is and has unknown date of follow up appointment.  Since Dr [name] left, it appears that medical records indicate Dr. Patsey Berthold in pulmonary medicine is following him.  Symptomatically, he has difficulty breathing due to COPD.  He is going to Urgent Care after his visit today. He feels worn out.  He is battling anxiety and depression and feels if "he's going crazy".  He denies any fever or phlegm production.  His shortness of breath and chest pain don't feel any different from normal  He has a follow up with Dr. Fletcher Anon in cardiology on 11/30/2019.    Past Medical History:  Diagnosis Date   Allergic rhinitis    Anxiety    Back pain    Colon polyps 08/2012   colonoscopy; multiple colon polyps; repeat colonoscopy in one year.   COPD (chronic obstructive pulmonary disease) ([place])    a. on home O2.   Coronary  artery disease 11/2009   a. late presenting ant MI; b. LHC 100% pLAD s/p PCI/DES, 99% mRCA s/p PCI/DES, EF 35%; c. nuclear stress test 05/13: prior ant/inf infarcts w/o ischemia, EF 43%; d. LHC 02/14: widely patent stents with no other obs dz, EF 40%; e. LHC 11/17: LM nl, mLAD 10%, patent LAD stent, dLAD 20%, p-mRCA 10%, mRCA 40%, patent RCA stent, EF 35-45%    Depression    GERD (gastroesophageal reflux  disease)    [id number])    Helicobacter pylori (H. pylori)    Hemorrhoids    Hernia    inguinal   HFrEF (heart failure with reduced ejection fraction) ([place])    a. 10/2016 LV gram: EF 35-45%; b. 06/2018 Echo: EF 40-45%.   Hyperlipidemia    Hypertension    Insomnia    Ischemic cardiomyopathy    a. 10/2016 LV gram: EF 35-45%; b. 06/2018 Echo: EF 40-45%.   Myalgia    Peptic ulcer    Pulmonary nodules    ST elevation (STEMI) myocardial infarction involving left anterior descending coronary artery ([place]) 11/2009   a. s/p PCI to the LAD   Status post dilation of esophageal narrowing 2000   Tinea pedis    Wears dentures    partial upper    Past Surgical History:  Procedure Laterality Date   Admission  12/20/2012   COPD exacerbation.  [place].   CARDIAC CATHETERIZATION     CARDIAC CATHETERIZATION  02/05/13   [facility]   CARDIAC CATHETERIZATION  10/14   [facility] : patent stents with no change in anatomy. EF: 40$   CARDIAC CATHETERIZATION Left 11/12/2016   Procedure: Left Heart Cath and Coronary Angiography;  Surgeon: Wellington Hampshire, MD;  Location: [facility];  Service: Cardiovascular;  Laterality: Left;   COLONOSCOPY     COLONOSCOPY WITH PROPOFOL N/A 05/18/2016   Procedure: COLONOSCOPY WITH PROPOFOL;  Surgeon: Lucilla Lame, MD;  Location: [facility] END[facility];  Service: Endoscopy;  Laterality: N/A;   CORONARY ANGIOPLASTY WITH STENT PLACEMENT  09/2009   LAD 3.0 X23 mm Xience DES, RCA: 4.0 X 15 mm Xience DES   ELECTROMAGNETIC NAVIGATION BROCHOSCOPY N/A 10/27/2017   Procedure: ELECTROMAGNETIC NAVIGATION BRONCHOSCOPY;  Surgeon: Flora Lipps, MD;  Location: [facility] ORS;  Service: Cardiopulmonary;  Laterality: N/A;   ESOPHAGEAL DILATION     ESOPHAGOGASTRODUODENOSCOPY  2008   ESOPHAGOGASTRODUODENOSCOPY  08/20/2012   HERNIA REPAIR  07/20/2012   L inguinal hernia repair   PENILE PROSTHESIS IMPLANT      Family History  Problem Relation Age of Onset   Heart attack Brother         Brother #1   Diabetes Brother    Hypertension Brother        #3   Coronary artery disease Father 48       deceased   Heart attack Father    Diabetes Father    Heart disease Father    COPD Mother 58       deceased   Alcohol abuse Sister        polysubstance abuse   COPD Sister    Lung cancer Sister    Alcohol abuse Sister        polysubstance abuse   Penile cancer Brother    Diabetes Brother     Social History:  reports that he quit smoking about 8 months ago. His smoking use included cigarettes. He has a 42.00 pack-year smoking history. He has never used smokeless tobacco. He reports that he does not  drink alcohol or use drugs.  He quit smoking 2019/03/08.  His wife died 2.5 years ago. He is retired by used to work in home improvement.  He lives in [place] with his dogs. The patient is alone today.  Allergies:  Allergies  Allergen Reactions   Prozac [Fluoxetine Hcl] Shortness Of Breath   Effexor Xr [Venlafaxine Hcl Er] Other (See Comments)    "Makes me feel funny"   Wellbutrin [Bupropion] Other (See Comments)    "Makes me feel funny"    Current Medications: Current Outpatient Medications  Medication Sig Dispense Refill   aspirin 81 MG tablet Take 81 mg by mouth daily.       atorvastatin (LIPITOR) 40 MG tablet Take 1 tablet (40 mg total) by mouth daily. 90 tablet 3   azithromycin (ZITHROMAX) 250 MG tablet Take 250 mg by mouth daily. Take 250 mg every Monday, Wednesday and Friday.     furosemide (LASIX) 20 MG tablet Take 1 tablet (20 mg total) by mouth daily. 90 tablet 3   hydrOXYzine (VISTARIL) 25 MG capsule Take 25 mg by mouth 2 (two) times daily as needed for anxiety or itching.   0   ipratropium-albuterol (DUONEB) 0.5-2.5 (3) MG/3ML SOLN Take 3 mLs by nebulization 4 (four) times daily. DX:J44.9 1620 mL 3   LORazepam (ATIVAN) 1 MG tablet Take 1 tablet (1 mg total) by mouth every 4 (four) hours while awake. 30 tablet 0   losartan (COZAAR) 25 MG  tablet Take 25 mg by mouth daily.     mirtazapine (REMERON) 30 MG tablet Take 1 tablet (30 mg total) by mouth at bedtime. 30 tablet 5   Multiple Vitamin (MULTIVITAMIN) capsule Take 1 capsule by mouth daily.     OLANZapine (ZYPREXA) 2.5 MG tablet Take 2.5 mg by mouth at bedtime.     OXYGEN Inhale 3 L into the lungs daily.      sucralfate (CARAFATE) 1 GM/10ML suspension Take 10 mLs (1 g total) by mouth 4 (four) times daily -  with meals and at bedtime. (Patient taking differently: Take 1 g by mouth 4 (four) times daily as needed. ) 3600 mL 3   tiotropium (SPIRIVA) 18 MCG inhalation capsule Place 18 mcg into inhaler and inhale daily.     albuterol (PROVENTIL HFA;VENTOLIN HFA) 108 (90 Base) MCG/ACT inhaler Inhale 2 puffs into the lungs every 6 (six) hours as needed for wheezing or shortness of breath. (Patient not taking: Reported on 11/02/2019) 1 Inhaler 2   Fluticasone-Salmeterol (ADVAIR) 500-50 MCG/DOSE AEPB Inhale 1 puff into the lungs every 12 (twelve) hours.     guaiFENesin-dextromethorphan (ROBITUSSIN DM) 100-10 MG/5ML syrup Take 5 mLs by mouth every 4 (four) hours as needed for cough. (Patient not taking: Reported on 11/02/2019) 118 mL 0   nitroGLYCERIN (NITROSTAT) 0.4 MG SL tablet Place 1 tablet (0.4 mg total) under the tongue every 5 (five) minutes as needed. (Patient not taking: Reported on 11/02/2019) 25 tablet 3   No current facility-administered medications for this visit.     Review of Systems  Constitutional: Positive for malaise/fatigue. Negative for chills, diaphoresis, fever and weight loss (16 pounds up since 05/24/2019).       "Tired. Gives out".  HENT: Negative.  Negative for congestion, ear pain, hearing loss, nosebleeds, sinus pain and sore throat.   Eyes: Negative.  Negative for blurred vision, double vision and photophobia.  Respiratory: Positive for cough (non-productive, chronic), shortness of breath (chronic) and wheezing (wheezing spells). Negative for  hemoptysis and sputum  production.   Cardiovascular: Negative.  Negative for chest pain, palpitations, orthopnea, leg swelling and PND.  Gastrointestinal: Negative.  Negative for abdominal pain, blood in stool, constipation, diarrhea, heartburn, melena, nausea and vomiting.  Genitourinary: Negative.  Negative for dysuria, frequency, hematuria and urgency.  Musculoskeletal: Negative.  Negative for back pain, joint pain and myalgias.  Skin: Negative.  Negative for itching and rash.  Neurological: Negative for dizziness, tingling, sensory change, speech change, focal weakness, weakness and headaches.  Endo/Heme/Allergies: Negative.  Does not bruise/bleed easily.  Psychiatric/Behavioral: Positive for depression. Negative for memory loss. The patient is nervous/anxious. The patient does not have insomnia.   All other systems reviewed and are negative.  Performance status (ECOG):  2  Vitals Blood pressure (!) 147/71, pulse 76, temperature 98.5 F (36.9 C), temperature source Tympanic, resp. rate 18, height 5\' 10"  (1.778 m), weight 182 lb 14 oz (83 kg), SpO2 97 %.   Physical Exam  Constitutional: He is oriented to person, place, and time. He appears well-developed and well-nourished. No distress.  HENT:  Head: Normocephalic and atraumatic.  Mouth/Throat: Oropharynx is clear and moist. No oropharyngeal exudate.  Dark hair.  Mustache.  Burna in place.  Mask.  Eyes: Pupils are equal, round, and reactive to light. Conjunctivae and EOM are normal. No scleral icterus.  Neck: Normal range of motion. No JVD present.  Cardiovascular: Normal rate, regular rhythm and normal heart sounds.  No murmur heard. Pulmonary/Chest: Effort normal. No accessory muscle usage. No respiratory distress. He has wheezes (right sided). He has no rales.  Abdominal: Soft. Bowel sounds are normal. He exhibits no distension and no mass. There is no abdominal tenderness. There is no rebound and no guarding.  Ventral hernia.    Musculoskeletal: Normal range of motion.        General: No edema.  Lymphadenopathy:       Head (right side): No preauricular, no posterior auricular and no occipital adenopathy present.       Head (left side): No preauricular, no posterior auricular and no occipital adenopathy present.    He has no cervical adenopathy.    He has no axillary adenopathy.       Right: No inguinal and no supraclavicular adenopathy present.       Left: No inguinal and no supraclavicular adenopathy present.  Neurological: He is alert and oriented to person, place, and time.  Skin: Skin is warm. No rash noted. He is not diaphoretic. No erythema. No pallor.  Tattoo "love" on his left knuckles.  Psychiatric: He has a normal mood and affect. His behavior is normal. Judgment and thought content normal.  Nursing note reviewed.   No visits with results within 3 Day(s) from this visit.  Latest known visit with results is:  Appointment on 11/02/2019  Component Date Value Ref Range Status   WBC 11/02/2019 7.1  4.0 - 10.5 K/uL Final   RBC 11/02/2019 4.46  4.22 - 5.81 MIL/uL Final   Hemoglobin 11/02/2019 12.9* 13.0 - 17.0 g/dL Final   HCT 11/02/2019 40.8  39.0 - 52.0 % Final   MCV 11/02/2019 91.5  80.0 - 100.0 fL Final   MCH 11/02/2019 28.9  26.0 - 34.0 pg Final   MCHC 11/02/2019 31.6  30.0 - 36.0 g/dL Final   RDW 11/02/2019 12.2  11.5 - 15.5 % Final   Platelets 11/02/2019 199  150 - 400 K/uL Final   nRBC 11/02/2019 0.0  0.0 - 0.2 % Final   Neutrophils Relative % 11/02/2019 63  %  Final   Neutro Abs 11/02/2019 4.5  1.7 - 7.7 K/uL Final   Lymphocytes Relative 11/02/2019 21  % Final   Lymphs Abs 11/02/2019 1.5  0.7 - 4.0 K/uL Final   Monocytes Relative 11/02/2019 11  % Final   Monocytes Absolute 11/02/2019 0.8  0.1 - 1.0 K/uL Final   Eosinophils Relative 11/02/2019 3  % Final   Eosinophils Absolute 11/02/2019 0.2  0.0 - 0.5 K/uL Final   Basophils Relative 11/02/2019 1  % Final   Basophils  Absolute 11/02/2019 0.0  0.0 - 0.1 K/uL Final   Immature Granulocytes 11/02/2019 1  % Final   Abs Immature Granulocytes 11/02/2019 0.04  0.00 - 0.07 K/uL Final   Performed at [facility], [address], [place], [place] [address]   Sodium 11/02/2019 141  135 - 145 mmol/L Final   Potassium 11/02/2019 4.0  3.5 - 5.1 mmol/L Final   Chloride 11/02/2019 97* 98 - 111 mmol/L Final   CO2 11/02/2019 34* 22 - 32 mmol/L Final   Glucose, Bld 11/02/2019 164* 70 - 99 mg/dL Final   BUN 11/02/2019 17  8 - 23 mg/dL Final   Creatinine, Ser 11/02/2019 1.04  0.61 - 1.24 mg/dL Final   Calcium 11/02/2019 9.2  8.9 - 10.3 mg/dL Final   Total Protein 11/02/2019 7.1  6.5 - 8.1 g/dL Final   Albumin 11/02/2019 4.2  3.5 - 5.0 g/dL Final   AST 11/02/2019 23  15 - 41 U/L Final   ALT 11/02/2019 26  0 - 44 U/L Final   Alkaline Phosphatase 11/02/2019 62  38 - 126 U/L Final   Total Bilirubin 11/02/2019 0.5  0.3 - 1.2 mg/dL Final   GFR calc non Af Amer 11/02/2019 >60  >60 mL/min Final   GFR calc Af Amer 11/02/2019 >60  >60 mL/min Final   Anion gap 11/02/2019 10  5 - 15 Final   Performed at [facility], [address], [place], [place] [address]    Assessment:  Leslie Sims is a 68 y.o. male with bilateral pulmonary nodules s/p electromagnetic navigation bronchoscopy on 10/27/2017.  Pathology revealed alveolated lung tissue with hemosiderin laden macrophages, negative for atypia and malignancy from the RUL and LUL.  He has a 42 pack year smoking history.  Chest CT without contrast on 10/04/2017 revealed two new irregularly marginated nodules, 1.4 x 1.8 cm in the right upper lobe, and 1.3 x 1.7 cm in the mid left upper lobe worrisome for either synchronous lung carcinoma or metastatic involvement of the lungs. Two small nodules described on the prior dictated report had resolved in the left superior segment and most likely were postinflammatory. There was diffuse  centrilobular emphysema and changes of prior granulomatous disease.  PET scan on 10/13/2017 revealed the left upper lobe nodule was FDG avid (SUV 5.8) consistent with malignancy. The new nodule in the right upper lobe demonstrated low level uptake (SUV 1.4), with the possibility of an inflammatory or infectious process and a low-grade malignancy could not be excluded. No distant metastatic disease was identified.  CT chest on 05/01/2019 revealed a stable left upper lobe nodule, likely benign. There was waxing/waning right lung opacities, suggesting infection, including atypical/mycobacterial infection.  Improvement of the prior dominant masslike opacity argued against neoplasm.  Chest CT on 11/02/2019 revealed the vast majority of pulmonary nodules were stable. The largest of the previously noted pulmonary nodules in the right upper lobe had regressed significantly compared to the prior study, indicative  of a resolving infectious/inflammatory process. There was diffuse bronchial wall thickening with moderate centrilobular and paraseptal emphysema.  Imaging findings were suggestive of underlying COPD.  There were bilateral adrenal adenomas, aortic atherosclerosis,left main and 3 vessel coronary artery disease.  There was low-attenuation in the myocardium throughout the mid to distal LAD territory compatible with fibrofatty metaplasia from prior LAD territory myocardial infarction(s).  Symptomatically, he feels he is having a COPD flare.  Exam reveals right sided wheezes.  Plan: 1.   Bilateral pulmonary nodules             Review chest CT from 11/02/2019.  Images personally reviewed.  Agree with radiology interpretation.   Pulmonary nodules appear to be stable with some smaller.   Discuss plan for ongoing surveillance.              Patient previously followed by Dr [name] and now by Dr Patsey Berthold.   Encourage ongoing pulmonary medicine follow-up. 2.   Coronary artery disease            CT scan  reveals left main and 3 vessel coronary artery disease.  Patient denies any chest pain.  Encourage follow-up with Dr Fletcher Anon. 3.   COPD flare  Patient feels like he is having a COPD flare.  Exam reveals soft right sided wheezing.  Patient going to [facility] today. 4.   Chest CT on 05/02/2020. 5.   RTC after chest CT for MD assessment and review of imaging.  I discussed the assessment and treatment plan with the patient.  The patient was provided an opportunity to ask questions and all were answered.  The patient agreed with the plan and demonstrated an understanding of the instructions.  The patient was advised to call back if the symptoms worsen or if the condition fails to improve as anticipated.  I provided 20 minutes (1:15 PM - 1:35 PM) of face-to-face time during this this encounter and > 50% was spent counseling as documented under my assessment and plan.    Lequita Asal, MD, PhD    11/05/2019, 1:35 PM  I, Samul Dada, am acting as a scribe for Lequita Asal, MD.  I, [facility] Mike Gip, MD, have reviewed the above documentation for accuracy and completeness, and I agree with the above.

## 2019-11-05 ENCOUNTER — Inpatient Hospital Stay (HOSPITAL_BASED_OUTPATIENT_CLINIC_OR_DEPARTMENT_OTHER): Payer: Medicare Other | Admitting: Hematology and Oncology

## 2019-11-05 ENCOUNTER — Ambulatory Visit
Admission: EM | Admit: 2019-11-05 | Discharge: 2019-11-05 | Disposition: A | Discharge status: Home / Self Care | Payer: Medicare Other | Attending: Family Medicine | Admitting: Family Medicine

## 2019-11-05 ENCOUNTER — Other Ambulatory Visit: Payer: Self-pay

## 2019-11-05 ENCOUNTER — Encounter: Payer: Self-pay | Admitting: Hematology and Oncology

## 2019-11-05 VITALS — BP 147/71 | HR 76 | Temp 98.5°F | Resp 18 | Ht 70.0 in | Wt 182.9 lb

## 2019-11-05 DIAGNOSIS — Z79899 Other long term (current) drug therapy: Secondary | ICD-10-CM | POA: Diagnosis not present

## 2019-11-05 DIAGNOSIS — Z7982 Long term (current) use of aspirin: Secondary | ICD-10-CM | POA: Diagnosis not present

## 2019-11-05 DIAGNOSIS — J441 Chronic obstructive pulmonary disease with (acute) exacerbation: Secondary | ICD-10-CM | POA: Insufficient documentation

## 2019-11-05 DIAGNOSIS — R062 Wheezing: Secondary | ICD-10-CM

## 2019-11-05 DIAGNOSIS — F329 Major depressive disorder, single episode, unspecified: Secondary | ICD-10-CM | POA: Diagnosis not present

## 2019-11-05 DIAGNOSIS — I110 Hypertensive heart disease with heart failure: Secondary | ICD-10-CM | POA: Diagnosis not present

## 2019-11-05 DIAGNOSIS — E785 Hyperlipidemia, unspecified: Secondary | ICD-10-CM | POA: Diagnosis not present

## 2019-11-05 DIAGNOSIS — R918 Other nonspecific abnormal finding of lung field: Secondary | ICD-10-CM

## 2019-11-05 DIAGNOSIS — I5022 Chronic systolic (congestive) heart failure: Secondary | ICD-10-CM | POA: Diagnosis not present

## 2019-11-05 DIAGNOSIS — R05 Cough: Secondary | ICD-10-CM | POA: Diagnosis not present

## 2019-11-05 DIAGNOSIS — I255 Ischemic cardiomyopathy: Secondary | ICD-10-CM

## 2019-11-05 DIAGNOSIS — Z87891 Personal history of nicotine dependence: Secondary | ICD-10-CM | POA: Diagnosis not present

## 2019-11-05 DIAGNOSIS — R0602 Shortness of breath: Secondary | ICD-10-CM | POA: Diagnosis not present

## 2019-11-05 DIAGNOSIS — K219 Gastro-esophageal reflux disease without esophagitis: Secondary | ICD-10-CM | POA: Insufficient documentation

## 2019-11-05 DIAGNOSIS — F419 Anxiety disorder, unspecified: Secondary | ICD-10-CM | POA: Diagnosis not present

## 2019-11-05 DIAGNOSIS — Z20828 Contact with and (suspected) exposure to other viral communicable diseases: Secondary | ICD-10-CM | POA: Insufficient documentation

## 2019-11-05 MED ORDER — PREDNISONE 10 MG PO TABS: ORAL_TABLET | ORAL | 0 refills | Status: DC

## 2019-11-05 MED ORDER — DOXYCYCLINE HYCLATE 100 MG PO CAPS: 100.0000 mg | ORAL_CAPSULE | Freq: Two times a day (BID) | ORAL | 0 refills | Status: DC

## 2019-11-05 NOTE — ED Triage Notes (Addendum)
Pt. States he just left from his Cancer dr. [name] she suggested he come here for his wheezing. He has had a lot of wheezing for about a week now, he is on oxygen. His O2 has stayed between 90-100 the whole time.

## 2019-11-05 NOTE — Discharge Instructions (Addendum)
Take medication as prescribed. Rest. Drink plenty of fluids.  Use your inhaler and nebulizer as discussed as directed.  Follow up with your primary care physician this week. Return to Urgent care or ER for chest pain, increased shortness of breath, new or worsening concerns.

## 2019-11-05 NOTE — ED Provider Notes (Signed)
[facility] ____________________________________________  Time seen: Approximately 2:37 PM  I have reviewed the triage vital signs and the nursing notes.   HISTORY  Chief Complaint Wheezing   HPI Leslie Sims is a 68 y.o. male past medical history of MI, hypertension, hyperlipidemia, COPD and pulmonary nodules presenting for evaluation of 1 week of intermittent wheezing.  Patient reports feels consistent with his previous COPD exacerbations.  Patient is on chronic oxygen at home, stating normally on 2.5 L.  Patient has recently been following with oncology due to pulmonary nodules in which he just had a CT of his chest 3 days ago.  Had a follow-up appointment with oncology today but due to wheezing was referred over to urgent care for management.  Has been using his home albuterol inhaler and nebulizer intermittently with some improvement.  Patient denies chest pain or hemoptysis.  Reports he does have some shortness of breath, but reports it is similar to his chronic and only occasional worse at night.  Has had occasional cough.  No congestion or sore throat.  Denies fevers.  Denies known sick contacts.  Has continued to eat and drink well.  Has continued remain active.  No extremity edema.  Denies recent steroid or antibiotic use.  [name], MD : PCP   Past Medical History:  Diagnosis Date   Allergic rhinitis    Anxiety    Back pain    Colon polyps 08/2012   colonoscopy; multiple colon polyps; repeat colonoscopy in one year.   COPD (chronic obstructive pulmonary disease) ([place])    a. on home O2.   Coronary artery disease 11/2009   a. late presenting ant MI; b. LHC 100% pLAD s/p PCI/DES, 99% mRCA s/p PCI/DES, EF 35%; c. nuclear stress test 05/13: prior ant/inf infarcts w/o ischemia, EF 43%; d. LHC 02/14: widely patent stents with no other obs dz, EF 40%; e. LHC 11/17: LM nl, mLAD 10%, patent LAD stent, dLAD 20%, p-mRCA 10%, mRCA 40%, patent RCA stent, EF  35-45%    Depression    GERD (gastroesophageal reflux disease)    [id number])    Helicobacter pylori (H. pylori)    Hemorrhoids    Hernia    inguinal   HFrEF (heart failure with reduced ejection fraction) ([place])    a. 10/2016 LV gram: EF 35-45%; b. 06/2018 Echo: EF 40-45%.   Hyperlipidemia    Hypertension    Insomnia    Ischemic cardiomyopathy    a. 10/2016 LV gram: EF 35-45%; b. 06/2018 Echo: EF 40-45%.   Myalgia    Peptic ulcer    Pulmonary nodules    ST elevation (STEMI) myocardial infarction involving left anterior descending coronary artery ([place]) 11/2009   a. s/p PCI to the LAD   Status post dilation of esophageal narrowing 2000   Tinea pedis    Wears dentures    partial upper    Patient Active Problem List   Diagnosis Date Noted   Goals of care, counseling/discussion    Palliative care by specialist    Healthcare-associated pneumonia 01/28/2019   COPD with acute exacerbation ([place]) 12/31/2018   Acute respiratory failure with hypoxia ([place]) 09/29/2018   Acute respiratory failure with hypoxia and hypercapnia ([place]) 09/13/2018   Acute on chronic respiratory failure with hypoxia and hypercapnia ([place]) 08/24/2018   Chest pain 07/02/2018   Grief reaction 01/13/2018   COPD with hypoxia ([place]) 12/25/2017   Unstable angina ([facility])    Benign neoplasm of ascending colon  Benign neoplasm of descending colon    Benign neoplasm of sigmoid colon    Personal history of colonic polyps    Pulmonary nodules/lesions, multiple 01/05/2015   Bruit of left carotid artery 11/04/2014   Sleep disorder 07/18/2013   Seasonal and perennial allergic rhinitis 05/29/2013   Bradycardia 04/20/2011   SMOKER 07/30/2010   CAD, NATIVE VESSEL 04/23/2010   Hyperlipidemia 03/24/2010   DEPRESSION/ANXIETY [id number]   Chronic systolic heart failure ([place]) 03/24/2010   COPD, very severe ([place]) 03/24/2010   GERD 03/24/2010   Essential hypertension 11/21/2009     Past Surgical History:  Procedure Laterality Date   Admission  12/20/2012   COPD exacerbation.  [place].   CARDIAC CATHETERIZATION     CARDIAC CATHETERIZATION  02/05/13   [facility]   CARDIAC CATHETERIZATION  10/14   [facility] : patent stents with no change in anatomy. EF: 40$   CARDIAC CATHETERIZATION Left 11/12/2016   Procedure: Left Heart Cath and Coronary Angiography;  Surgeon: Wellington Hampshire, MD;  Location: [facility];  Service: Cardiovascular;  Laterality: Left;   COLONOSCOPY     COLONOSCOPY WITH PROPOFOL N/A 05/18/2016   Procedure: COLONOSCOPY WITH PROPOFOL;  Surgeon: Lucilla Lame, MD;  Location: [facility];  Service: Endoscopy;  Laterality: N/A;   CORONARY ANGIOPLASTY WITH STENT PLACEMENT  09/2009   LAD 3.0 X23 mm Xience DES, RCA: 4.0 X 15 mm Xience DES   ELECTROMAGNETIC NAVIGATION BROCHOSCOPY N/A 10/27/2017   Procedure: ELECTROMAGNETIC NAVIGATION BRONCHOSCOPY;  Surgeon: Flora Lipps, MD;  Location: [facility] ORS;  Service: Cardiopulmonary;  Laterality: N/A;   ESOPHAGEAL DILATION     ESOPHAGOGASTRODUODENOSCOPY  2008   ESOPHAGOGASTRODUODENOSCOPY  08/20/2012   HERNIA REPAIR  07/20/2012   L inguinal hernia repair   PENILE PROSTHESIS IMPLANT       No current facility-administered medications for this encounter.   Current Outpatient Medications:    albuterol (PROVENTIL HFA;VENTOLIN HFA) 108 (90 Base) MCG/ACT inhaler, Inhale 2 puffs into the lungs every 6 (six) hours as needed for wheezing or shortness of breath. (Patient not taking: Reported on 11/02/2019), Disp: 1 Inhaler, Rfl: 2   aspirin 81 MG tablet, Take 81 mg by mouth daily.  , Disp: , Rfl:    atorvastatin (LIPITOR) 40 MG tablet, Take 1 tablet (40 mg total) by mouth daily., Disp: 90 tablet, Rfl: 3   azithromycin (ZITHROMAX) 250 MG tablet, Take 250 mg by mouth daily. Take 250 mg every Monday, Wednesday and Friday., Disp: , Rfl:    doxycycline (VIBRAMYCIN) 100 MG capsule, Take 1 capsule (100 mg total) by mouth 2  (two) times daily., Disp: 20 capsule, Rfl: 0   Fluticasone-Salmeterol (ADVAIR) 500-50 MCG/DOSE AEPB, Inhale 1 puff into the lungs every 12 (twelve) hours., Disp: , Rfl:    furosemide (LASIX) 20 MG tablet, Take 1 tablet (20 mg total) by mouth daily., Disp: 90 tablet, Rfl: 3   guaiFENesin-dextromethorphan (ROBITUSSIN DM) 100-10 MG/5ML syrup, Take 5 mLs by mouth every 4 (four) hours as needed for cough. (Patient not taking: Reported on 11/02/2019), Disp: 118 mL, Rfl: 0   hydrOXYzine (VISTARIL) 25 MG capsule, Take 25 mg by mouth 2 (two) times daily as needed for anxiety or itching. , Disp: , Rfl: 0   ipratropium-albuterol (DUONEB) 0.5-2.5 (3) MG/3ML SOLN, Take 3 mLs by nebulization 4 (four) times daily. DX:J44.9, Disp: 1620 mL, Rfl: 3   LORazepam (ATIVAN) 1 MG tablet, Take 1 tablet (1 mg total) by mouth every 4 (four) hours while awake., Disp: 30 tablet, Rfl: 0  losartan (COZAAR) 25 MG tablet, Take 25 mg by mouth daily., Disp: , Rfl:    mirtazapine (REMERON) 30 MG tablet, Take 1 tablet (30 mg total) by mouth at bedtime., Disp: 30 tablet, Rfl: 5   Multiple Vitamin (MULTIVITAMIN) capsule, Take 1 capsule by mouth daily., Disp: , Rfl:    nitroGLYCERIN (NITROSTAT) 0.4 MG SL tablet, Place 1 tablet (0.4 mg total) under the tongue every 5 (five) minutes as needed. (Patient not taking: Reported on 11/02/2019), Disp: 25 tablet, Rfl: 3   OLANZapine (ZYPREXA) 2.5 MG tablet, Take 2.5 mg by mouth at bedtime., Disp: , Rfl:    OXYGEN, Inhale 3 L into the lungs daily. , Disp: , Rfl:    predniSONE (DELTASONE) 10 MG tablet, Start 60 mg po day one, then 50 mg po day two, taper by 10 mg daily until complete., Disp: 21 tablet, Rfl: 0   sucralfate (CARAFATE) 1 GM/10ML suspension, Take 10 mLs (1 g total) by mouth 4 (four) times daily -  with meals and at bedtime. (Patient taking differently: Take 1 g by mouth 4 (four) times daily as needed. ), Disp: 3600 mL, Rfl: 3   tiotropium (SPIRIVA) 18 MCG inhalation  capsule, Place 18 mcg into inhaler and inhale daily., Disp: , Rfl:   Allergies Prozac [fluoxetine hcl], Effexor xr [venlafaxine hcl er], and Wellbutrin [bupropion]  Family History  Problem Relation Age of Onset   Heart attack Brother        Brother #1   Diabetes Brother    Hypertension Brother        #3   Coronary artery disease Father 35       deceased   Heart attack Father    Diabetes Father    Heart disease Father    COPD Mother 82       deceased   Alcohol abuse Sister        polysubstance abuse   COPD Sister    Lung cancer Sister    Alcohol abuse Sister        polysubstance abuse   Penile cancer Brother    Diabetes Brother     Social History Social History   Tobacco Use   Smoking status: Former Smoker    Packs/day: 1.00    Years: 42.00    Pack years: 42.00    Types: Cigarettes    Quit date: 02/10/2019    Years since quitting: 0.7   Smokeless tobacco: Never Used  Substance Use Topics   Alcohol use: No    Alcohol/week: 0.0 standard drinks   Drug use: No    Review of Systems Constitutional: No fever ENT: No sore throat. Cardiovascular: Denies chest pain. Respiratory: As above.  Gastrointestinal: No abdominal pain.  No vomiting.  No diarrhea.  Musculoskeletal: Negative for back pain. Skin: Negative for rash.   ____________________________________________   PHYSICAL EXAM:  VITAL SIGNS: ED Triage Vitals  Enc Vitals Group     BP 11/05/19 1401 135/75     Pulse Rate 11/05/19 1401 (!) 115 Recheck 78     Resp 11/05/19 1401 17     Temp 11/05/19 1401 98.9 F (37.2 C)     Temp Source 11/05/19 1401 Oral     SpO2 11/05/19 1401 96 %      Weight 11/05/19 1359 181 lb (82.1 kg)     Height --      Head Circumference --      Peak Flow --      Pain Score 11/05/19 1357  0     Pain Loc --      Pain Edu? --      Excl. in [place]? --     Constitutional: Alert and oriented. Well appearing and in no acute distress. Eyes: Conjunctivae are normal.    ENT      Head: Normocephalic and atraumatic. Cardiovascular: Normal rate, regular rhythm. Grossly normal heart sounds.  Good peripheral circulation. Respiratory: Normal respiratory effort without tachypnea nor retractions.  No rhonchi.  Mild scattered expiratory wheezes.  Speaks in complete sentences. Gastrointestinal: Soft and nontender.  Musculoskeletal: Steady gait.  No extremity edema noted bilaterally. Neurologic:  Normal speech and language. Speech is normal. No gait instability.  Skin:  Skin is warm, dry. Psychiatric: Mood and affect are normal. Speech and behavior are normal. Patient exhibits appropriate insight and judgment   ___________________________________________   LABS (all labs ordered are listed, but only abnormal results are displayed)  Labs Reviewed  NOVEL CORONAVIRUS, NAA (HOSP ORDER, SEND-OUT TO REF LAB; TAT 18-24 HRS)   ____________________________________________  RADIOLOGY  No results found.   CLINICAL DATA:  68 year old male with history of pulmonary nodule. Follow-up study.  EXAM: CT CHEST WITHOUT CONTRAST  TECHNIQUE: Multidetector CT imaging of the chest was performed following the standard protocol without IV contrast.  COMPARISON:  Chest CT 05/01/2019.  FINDINGS: Cardiovascular: Heart size is normal. There is no significant pericardial fluid, thickening or pericardial calcification. There is aortic atherosclerosis, as well as atherosclerosis of the great vessels of the mediastinum and the coronary arteries, including calcified atherosclerotic plaque in the left main, left anterior descending, left circumflex and right coronary arteries. Extensive low-attenuation in the myocardium throughout the mid to distal LAD territory.  Mediastinum/Nodes: No pathologically enlarged mediastinal or hilar lymph nodes. Please note that accurate exclusion of hilar adenopathy is limited on noncontrast CT scans. Esophagus is unremarkable  in appearance. No axillary lymphadenopathy.  Lungs/Pleura: When compared to the prior examination, the multiple larger pulmonary nodules and mass-like areas seen on the prior study have largely regressed. This is best demonstrated by the largest lesion on the prior examination which is significantly smaller currently measuring 3.5 x 1.3 cm in the right upper lobe (axial image 36 of series 3), previously 4.3 x 1.9 cm and far more bulky. The other dominant nodule in the right upper lobe posteriorly which measured 17 x 11 mm on the prior study is now a small ill-defined 4 mm nodule (axial image 51 of series 3). Several calcified granulomas are again noted. Multiple other smaller pulmonary nodules are also noted, stable compared to the prior examination. No acute consolidative airspace disease. No pleural effusions. Diffuse bronchial wall thickening with moderate centrilobular and paraseptal emphysema.  Upper Abdomen: Aortic atherosclerosis. Right adrenal nodule measuring 1.7 x 1.3 cm is low-attenuation (-13 HU). Low-attenuation (-4 HU) left adrenal nodule measuring 2.7 x 2.6 cm.  Musculoskeletal: There are no aggressive appearing lytic or blastic lesions noted in the visualized portions of the skeleton.  IMPRESSION: 1. The vast majority of previously noted pulmonary nodules are stable. The largest of the previously noted pulmonary nodules in the right upper lobe on the prior examination have regressed significantly compared to the prior study, indicative of a resolving infectious/inflammatory process. 2. Diffuse bronchial wall thickening with moderate centrilobular and paraseptal emphysema; imaging findings suggestive of underlying COPD. 3. Aortic atherosclerosis, in addition to left main and 3 vessel coronary artery disease. Low-attenuation in the myocardium throughout the mid to distal LAD territory compatible with fibrofatty metaplasia from  prior LAD territory  myocardial infarction(s). 4. Bilateral adrenal adenomas.  Aortic Atherosclerosis (ICD10-I70.0).   Electronically Signed   By: Vinnie Langton M.D.   On: 11/02/2019 10:28 ____________________________________________   PROCEDURES Procedures    INITIAL IMPRESSION / ASSESSMENT AND PLAN / ED COURSE  Pertinent labs & imaging results that were available during my care of the patient were reviewed by me and considered in my medical decision making (see chart for details).  Well-appearing patient.  No acute distress.  Suspect COPD exacerbation.  Patient reports symptoms have been present for the last 1 week.  See above for recent imaging 3 days ago of CT chest.  Will treat patient with oral prednisone and oral doxycycline.  Encourage continue home inhalers as needed and supportive care.  COVID-19 testing also completed and advice given.  Monitoring follow-up with primary.Discussed indication, risks and benefits of medications with patient.   Discussed follow up with Primary care physician this week. Discussed follow up and return parameters including no resolution or any worsening concerns. Patient verbalized understanding and agreed to plan.   ____________________________________________   FINAL CLINICAL IMPRESSION(S) / ED DIAGNOSES  Final diagnoses:  COPD exacerbation [facility])     ED Discharge Orders         Ordered    predniSONE (DELTASONE) 10 MG tablet     11/05/19 1436    doxycycline (VIBRAMYCIN) 100 MG capsule  2 times daily     11/05/19 1436           Note: This dictation was prepared with Dragon dictation along with smaller phrase technology. Any transcriptional errors that result from this process are unintentional.         [name], NP 11/05/19 1520

## 2019-11-06 LAB — NOVEL CORONAVIRUS, NAA (HOSP ORDER, SEND-OUT TO REF LAB; TAT 18-24 HRS): SARS-CoV-2, NAA: NOT DETECTED

## 2019-11-30 ENCOUNTER — Ambulatory Visit: Payer: Medicare Other | Admitting: Cardiovascular Disease

## 2019-12-04 ENCOUNTER — Other Ambulatory Visit: Payer: Self-pay

## 2019-12-04 ENCOUNTER — Ambulatory Visit (INDEPENDENT_AMBULATORY_CARE_PROVIDER_SITE_OTHER): Payer: Medicare Other | Admitting: Cardiovascular Disease

## 2019-12-04 ENCOUNTER — Encounter: Payer: Self-pay | Admitting: Cardiovascular Disease

## 2019-12-04 VITALS — BP 120/62 | HR 94 | Temp 98.1°F | Ht 70.0 in | Wt 184.5 lb

## 2019-12-04 DIAGNOSIS — I10 Essential (primary) hypertension: Secondary | ICD-10-CM | POA: Diagnosis not present

## 2019-12-04 DIAGNOSIS — I255 Ischemic cardiomyopathy: Secondary | ICD-10-CM

## 2019-12-04 DIAGNOSIS — I5022 Chronic systolic (congestive) heart failure: Secondary | ICD-10-CM

## 2019-12-04 DIAGNOSIS — I2510 Atherosclerotic heart disease of native coronary artery without angina pectoris: Secondary | ICD-10-CM

## 2019-12-04 NOTE — Progress Notes (Signed)
Cardiology Office Note   Date:  12/04/2019   ID:  Leslie Sims, DOB 1951/12/09, MRN [id number]  PCP:  [name], MD  Cardiologist:   Kathlyn Sacramento, MD   Chief Complaint  Patient presents with  . other    4 month follow up. Meds reviewed by the pt. verbally. Pt. c/o chest pain and worsening shortness of breath for the past two days.       History of Present Illness: Leslie Sims is a 68 y.o. male who presents for a follow-up visit regarding coronary artery disease and ischemic cardiomyopathy.  He has known history of severe COPD on home oxygen, prolonged history of tobacco use but quit recently and hyperlipidemia. He is status post anterior myocardial infarction in 2010 with finding of an occluded LAD and a 99% stenosis in the RCA.  He was treated with drug-eluting stent placement to the LAD and RCA at that time.  He subsequently underwent repeat catheterization in November 2017 which showed patent LAD and RCA stents with an EF of 35 to 40%.  He had multiple hospitalizations last year for COPD exacerbation and he was finally able to quit smoking.  No hospitalization since then.  He felt much better since he quit smoking.  He continues to use 2 L nasal cannula oxygen.  He has chronic exertional dyspnea and occasional chest discomfort but not very frequent.  He takes his medications regularly. He was seen by oncology for pulmonary nodules.  There was concerns about CT findings which showed coronary calcifications and evidence of prior anterior MI.  I explained to him that these are all expected findings based on his cardiac history.    Past Medical History:  Diagnosis Date  . Allergic rhinitis   . Anxiety   . Back pain   . Colon polyps 08/2012   colonoscopy; multiple colon polyps; repeat colonoscopy in one year.  [name] COPD (chronic obstructive pulmonary disease) ([place])    a. on home O2.  . Coronary artery disease 11/2009   a. late presenting ant MI; b. LHC 100%  pLAD s/p PCI/DES, 99% mRCA s/p PCI/DES, EF 35%; c. nuclear stress test 05/13: prior ant/inf infarcts w/o ischemia, EF 43%; d. LHC 02/14: widely patent stents with no other obs dz, EF 40%; e. LHC 11/17: LM nl, mLAD 10%, patent LAD stent, dLAD 20%, p-mRCA 10%, mRCA 40%, patent RCA stent, EF 35-45%   . Depression   . GERD (gastroesophageal reflux disease)   . Headache(784.0)   . Helicobacter pylori (H. pylori)   . Hemorrhoids   . Hernia    inguinal  . HFrEF (heart failure with reduced ejection fraction) ([place])    a. 10/2016 LV gram: EF 35-45%; b. 06/2018 Echo: EF 40-45%.  . Hyperlipidemia   . Hypertension   . Insomnia   . Ischemic cardiomyopathy    a. 10/2016 LV gram: EF 35-45%; b. 06/2018 Echo: EF 40-45%.  . Myalgia   . Peptic ulcer   . Pulmonary nodules   . ST elevation (STEMI) myocardial infarction involving left anterior descending coronary artery ([place]) 11/2009   a. s/p PCI to the LAD  . Status post dilation of esophageal narrowing 2000  . Tinea pedis   . Wears dentures    partial upper    Past Surgical History:  Procedure Laterality Date  . Admission  12/20/2012   COPD exacerbation.  [place].  [name] CARDIAC CATHETERIZATION    . CARDIAC CATHETERIZATION  02/05/13   [facility]  .  CARDIAC CATHETERIZATION  10/14   [facility] : patent stents with no change in anatomy. EF: 40$  . CARDIAC CATHETERIZATION Left 11/12/2016   Procedure: Left Heart Cath and Coronary Angiography;  Surgeon: Wellington Hampshire, MD;  Location: [place] [facility];  Service: Cardiovascular;  Laterality: Left;  . COLONOSCOPY    . COLONOSCOPY WITH PROPOFOL N/A 05/18/2016   Procedure: COLONOSCOPY WITH PROPOFOL;  Surgeon: Lucilla Lame, MD;  Location: [facility];  Service: Endoscopy;  Laterality: N/A;  . CORONARY ANGIOPLASTY WITH STENT PLACEMENT  09/2009   LAD 3.0 X23 mm Xience DES, RCA: 4.0 X 15 mm Xience DES  . ELECTROMAGNETIC NAVIGATION BROCHOSCOPY N/A 10/27/2017   Procedure: ELECTROMAGNETIC NAVIGATION BRONCHOSCOPY;  Surgeon: Flora Lipps, MD;  Location: [facility];  Service: Cardiopulmonary;  Laterality: N/A;  . ESOPHAGEAL DILATION    . ESOPHAGOGASTRODUODENOSCOPY  2008  . ESOPHAGOGASTRODUODENOSCOPY  08/20/2012  . HERNIA REPAIR  07/20/2012   L inguinal hernia repair  . PENILE PROSTHESIS IMPLANT       Current Outpatient Medications  Medication Sig Dispense Refill  . albuterol (PROVENTIL HFA;VENTOLIN HFA) 108 (90 Base) MCG/ACT inhaler Inhale 2 puffs into the lungs every 6 (six) hours as needed for wheezing or shortness of breath. 1 Inhaler 2  . aspirin 81 MG tablet Take 81 mg by mouth daily.      [name] atorvastatin (LIPITOR) 40 MG tablet Take 1 tablet (40 mg total) by mouth daily. 90 tablet 3  . doxycycline (VIBRAMYCIN) 100 MG capsule Take 1 capsule (100 mg total) by mouth 2 (two) times daily. 20 capsule 0  . Fluticasone-Salmeterol (ADVAIR) 500-50 MCG/DOSE AEPB Inhale 1 puff into the lungs every 12 (twelve) hours.    . furosemide (LASIX) 20 MG tablet Take 1 tablet (20 mg total) by mouth daily. 90 tablet 3  . guaiFENesin-dextromethorphan (ROBITUSSIN DM) 100-10 MG/5ML syrup Take 5 mLs by mouth every 4 (four) hours as needed for cough. 118 mL 0  . hydrOXYzine (VISTARIL) 25 MG capsule Take 25 mg by mouth 2 (two) times daily as needed for anxiety or itching.   0  . ipratropium-albuterol (DUONEB) 0.5-2.5 (3) MG/3ML SOLN Take 3 mLs by nebulization 4 (four) times daily. DX:J44.9 1620 mL 3  . LORazepam (ATIVAN) 1 MG tablet Take 1 tablet (1 mg total) by mouth every 4 (four) hours while awake. 30 tablet 0  . losartan (COZAAR) 25 MG tablet Take 25 mg by mouth daily.    . mirtazapine (REMERON) 30 MG tablet Take 1 tablet (30 mg total) by mouth at bedtime. 30 tablet 5  . Multiple Vitamin (MULTIVITAMIN) capsule Take 1 capsule by mouth daily.    . nitroGLYCERIN (NITROSTAT) 0.4 MG SL tablet Place 1 tablet (0.4 mg total) under the tongue every 5 (five) minutes as needed. 25 tablet 3  . OLANZapine (ZYPREXA) 2.5 MG tablet Take 2.5 mg by mouth at  bedtime.    . OXYGEN Inhale 3 L into the lungs daily.      No current facility-administered medications for this visit.    Allergies:   Prozac [fluoxetine hcl], Effexor xr [venlafaxine hcl er], and Wellbutrin [bupropion]    Social History:  The patient  reports that he quit smoking about 9 months ago. His smoking use included cigarettes. He has a 42.00 pack-year smoking history. He has never used smokeless tobacco. He reports that he does not drink alcohol or use drugs.   Family History:  The patient's family history includes Alcohol abuse in his sister and sister; COPD  in his sister; COPD (age of onset: 36) in his mother; Coronary artery disease (age of onset: 77) in his father; Diabetes in his brother, brother, and father; Heart attack in his brother and father; Heart disease in his father; Hypertension in his brother; Lung cancer in his sister; Penile cancer in his brother.    ROS:  Please see the history of present illness.   Otherwise, review of systems are positive for none.   All other systems are reviewed and negative.    PHYSICAL EXAM: VS:  BP 120/62 (BP Location: Left Arm, Patient Position: Sitting, Cuff Size: Normal)   Pulse 94   Temp 98.1 F (36.7 C)   Ht 5\' 10"  (1.778 m)   Wt 184 lb 8 oz (83.7 kg)   BMI 26.47 kg/m  , BMI Body mass index is 26.47 kg/m. GEN: Well nourished, well developed, in no acute distress  HEENT: normal  Neck: no JVD, carotid bruits, or masses Cardiac: RRR; no murmurs, rubs, or gallops,no edema  Respiratory:  clear to auscultation bilaterally With diminished breath sounds, normal work of breathing GI: soft, nontender, nondistended, + BS MS: no deformity or atrophy  Skin: warm and dry, no rash Neuro:  Strength and sensation are intact Psych: euthymic mood, full affect  EKG:  EKG is ordered today. The ekg ordered today demonstrates sinus tachycardia with left anterior fascicular block and old anterior septal infarct.   Recent Labs: 12/31/2018:  B Natriuretic Peptide 31.0 02/10/2019: TSH 2.917 11/02/2019: ALT 26; BUN 17; Creatinine, Ser 1.04; Hemoglobin 12.9; Platelets 199; Potassium 4.0; Sodium 141    Lipid Panel    Component Value Date/Time   CHOL 143 07/27/2017 1525   TRIG 72 07/27/2017 1525   TRIG 65 01/15/2010 0000   HDL 62 07/27/2017 1525   CHOLHDL 2.3 07/27/2017 1525   CHOLHDL 1.8 08/24/2016 1344   VLDL 25 08/24/2016 1344   LDLCALC 67 07/27/2017 1525      Wt Readings from Last 3 Encounters:  12/04/19 184 lb 8 oz (83.7 kg)  11/05/19 181 lb (82.1 kg)  11/05/19 182 lb 14 oz (83 kg)        ASSESSMENT AND PLAN:  1.  Coronary artery disease involving native coronary arteries without angina: Status post prior anterior infarct with drug-eluting stent placement to the LAD and RCA in 2010.  Catheterization 2017 showed stable anatomy.  No recent chest pain.  Continue medical therapy  2.    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  Continue small dose losartan.  He did not tolerate beta-blockers in the past due to lung disease.  3.  Hyperlipidemia: Continue treatment with atorvastatin.  Recommend a target LDL of less than 70.   4.  Severe COPD: Followed by pulmonary: He is on home oxygen.   Disposition:   FU with me in 12 months  Signed,  Kathlyn Sacramento, MD  12/04/2019 3:50 PM    [place]

## 2019-12-04 NOTE — Patient Instructions (Signed)
Medication Instructions:  Your physician recommends that you continue on your current medications as directed. Please refer to the Current Medication list given to you today.  *If you need a refill on your cardiac medications before your next appointment, please call your pharmacy*  Lab Work: None ordered If you have labs (blood work) drawn today and your tests are completely normal, you will receive your results only by: [name] MyChart Message (if you have MyChart) OR . A paper copy in the mail If you have any lab test that is abnormal or we need to change your treatment, we will call you to review the results.  Testing/Procedures: None ordered  Follow-Up: At [facility], you and your health needs are our priority.  As part of our continuing mission to provide you with exceptional heart care, we have created designated Provider Care Teams.  These Care Teams include your primary Cardiologist (physician) and Advanced Practice Providers (APPs -  Physician Assistants and Nurse Practitioners) who all work together to provide you with the care you need, when you need it.  Your next appointment:   12 month(s)  The format for your next appointment:   In Person  Provider:    You may see Kathlyn Sacramento, MD or one of the following Advanced Practice Providers on your designated Care Team:    Murray Hodgkins, NP  Christell Faith, PA-C  Marrianne Mood, PA-C   Other Instructions N/A

## 2019-12-18 ENCOUNTER — Ambulatory Visit: Payer: Medicare Other | Admitting: Pulmonary Disease

## 2020-01-17 ENCOUNTER — Ambulatory Visit: Payer: Medicare Other

## 2020-01-21 ENCOUNTER — Other Ambulatory Visit: Payer: Self-pay | Admitting: Pulmonary Disease

## 2020-02-22 ENCOUNTER — Telehealth: Payer: Self-pay

## 2020-02-22 ENCOUNTER — Other Ambulatory Visit: Payer: Self-pay

## 2020-02-22 DIAGNOSIS — Z1211 Encounter for screening for malignant neoplasm of colon: Secondary | ICD-10-CM

## 2020-02-22 NOTE — Telephone Encounter (Signed)
Gastroenterology Pre-Procedure Review  Request Date: Wed 03/19/20 Requesting Physician: Dr. Marius Ditch  PATIENT REVIEW QUESTIONS: The patient responded to the following health history questions as indicated:    1. Are you having any GI issues? no 2. Do you have a personal history of Polyps?yes 3. Do you have a family history of Colon Cancer or Polyps? no 4. Diabetes Mellitus? no 5. Joint replacements in the past 12 months?no 6. Major health problems in the past 3 months?no 7. Any artificial heart valves, MVP, or defibrillator?no    MEDICATIONS & ALLERGIES:    Patient reports the following regarding taking any anticoagulation/antiplatelet therapy:   Plavix, Coumadin, Eliquis, Xarelto, Lovenox, Pradaxa, Brilinta, or Effient? no Aspirin? yes (81 mg daily)  Patient confirms/reports the following medications:  Current Outpatient Medications  Medication Sig Dispense Refill  . albuterol (PROVENTIL HFA;VENTOLIN HFA) 108 (90 Base) MCG/ACT inhaler Inhale 2 puffs into the lungs every 6 (six) hours as needed for wheezing or shortness of breath. 1 Inhaler 2  . aspirin 81 MG tablet Take 81 mg by mouth daily.      [name] atorvastatin (LIPITOR) 40 MG tablet Take 1 tablet (40 mg total) by mouth daily. 90 tablet 3  . doxycycline (VIBRAMYCIN) 100 MG capsule Take 1 capsule (100 mg total) by mouth 2 (two) times daily. 20 capsule 0  . Fluticasone-Salmeterol (ADVAIR) 500-50 MCG/DOSE AEPB Inhale 1 puff into the lungs every 12 (twelve) hours.    . furosemide (LASIX) 20 MG tablet Take 1 tablet (20 mg total) by mouth daily. 90 tablet 3  . guaiFENesin-dextromethorphan (ROBITUSSIN DM) 100-10 MG/5ML syrup Take 5 mLs by mouth every 4 (four) hours as needed for cough. 118 mL 0  . hydrOXYzine (VISTARIL) 25 MG capsule Take 25 mg by mouth 2 (two) times daily as needed for anxiety or itching.   0  . ipratropium-albuterol (DUONEB) 0.5-2.5 (3) MG/3ML SOLN USE 1 VIAL VIA NEBULIZER AND INHALE BY MOUTH 4 TIMES A DAY 1620 mL 1  .  LORazepam (ATIVAN) 1 MG tablet Take 1 tablet (1 mg total) by mouth every 4 (four) hours while awake. 30 tablet 0  . losartan (COZAAR) 25 MG tablet Take 25 mg by mouth daily.    . mirtazapine (REMERON) 30 MG tablet Take 1 tablet (30 mg total) by mouth at bedtime. 30 tablet 5  . Multiple Vitamin (MULTIVITAMIN) capsule Take 1 capsule by mouth daily.    . nitroGLYCERIN (NITROSTAT) 0.4 MG SL tablet Place 1 tablet (0.4 mg total) under the tongue every 5 (five) minutes as needed. 25 tablet 3  . OLANZapine (ZYPREXA) 2.5 MG tablet Take 2.5 mg by mouth at bedtime.    . OXYGEN Inhale 3 L into the lungs daily.      No current facility-administered medications for this visit.    Patient confirms/reports the following allergies:  Allergies  Allergen Reactions  . Prozac [Fluoxetine Hcl] Shortness Of Breath  . Effexor Xr [Venlafaxine Hcl Er] Other (See Comments)    "Makes me feel funny"  . Wellbutrin [Bupropion] Other (See Comments)    "Makes me feel funny"    No orders of the defined types were placed in this encounter.   AUTHORIZATION INFORMATION Primary Insurance: 1D#: Group #:  Secondary Insurance: 1D#: Group #:  SCHEDULE INFORMATION: Date: Wed 03/19/20 Time: Location:[facility]

## 2020-03-17 ENCOUNTER — Other Ambulatory Visit
Admission: RE | Admit: 2020-03-17 | Discharge: 2020-03-17 | Disposition: A | Discharge status: Home / Self Care | Payer: Medicare Other | Source: Ambulatory Visit | Attending: Gastroenterology | Admitting: Gastroenterology

## 2020-03-17 DIAGNOSIS — Z01812 Encounter for preprocedural laboratory examination: Secondary | ICD-10-CM | POA: Diagnosis present

## 2020-03-17 DIAGNOSIS — Z20822 Contact with and (suspected) exposure to covid-19: Secondary | ICD-10-CM | POA: Diagnosis not present

## 2020-03-17 LAB — SARS CORONAVIRUS 2 (TAT 6-24 HRS): SARS Coronavirus 2: NEGATIVE

## 2020-03-18 ENCOUNTER — Encounter: Payer: Self-pay | Admitting: Gastroenterology

## 2020-03-19 ENCOUNTER — Encounter
Admission: RE | Disposition: A | Discharge status: Home / Self Care | Payer: Self-pay | Source: Home / Self Care | Attending: Gastroenterology

## 2020-03-19 ENCOUNTER — Ambulatory Visit
Admission: RE | Admit: 2020-03-19 | Discharge: 2020-03-19 | Disposition: A | Discharge status: Home / Self Care | Payer: Medicare Other | Attending: Gastroenterology | Admitting: Gastroenterology

## 2020-03-19 ENCOUNTER — Ambulatory Visit: Payer: Medicare Other | Admitting: Anesthesiology

## 2020-03-19 ENCOUNTER — Other Ambulatory Visit: Payer: Self-pay

## 2020-03-19 ENCOUNTER — Encounter: Payer: Self-pay | Admitting: Gastroenterology

## 2020-03-19 DIAGNOSIS — I2510 Atherosclerotic heart disease of native coronary artery without angina pectoris: Secondary | ICD-10-CM | POA: Insufficient documentation

## 2020-03-19 DIAGNOSIS — F329 Major depressive disorder, single episode, unspecified: Secondary | ICD-10-CM | POA: Insufficient documentation

## 2020-03-19 DIAGNOSIS — Z833 Family history of diabetes mellitus: Secondary | ICD-10-CM | POA: Insufficient documentation

## 2020-03-19 DIAGNOSIS — E785 Hyperlipidemia, unspecified: Secondary | ICD-10-CM | POA: Diagnosis not present

## 2020-03-19 DIAGNOSIS — Z808 Family history of malignant neoplasm of other organs or systems: Secondary | ICD-10-CM | POA: Insufficient documentation

## 2020-03-19 DIAGNOSIS — K644 Residual hemorrhoidal skin tags: Secondary | ICD-10-CM | POA: Diagnosis not present

## 2020-03-19 DIAGNOSIS — D124 Benign neoplasm of descending colon: Secondary | ICD-10-CM | POA: Diagnosis not present

## 2020-03-19 DIAGNOSIS — Z7982 Long term (current) use of aspirin: Secondary | ICD-10-CM | POA: Diagnosis not present

## 2020-03-19 DIAGNOSIS — F419 Anxiety disorder, unspecified: Secondary | ICD-10-CM | POA: Diagnosis not present

## 2020-03-19 DIAGNOSIS — Z825 Family history of asthma and other chronic lower respiratory diseases: Secondary | ICD-10-CM | POA: Insufficient documentation

## 2020-03-19 DIAGNOSIS — I255 Ischemic cardiomyopathy: Secondary | ICD-10-CM | POA: Insufficient documentation

## 2020-03-19 DIAGNOSIS — Z955 Presence of coronary angioplasty implant and graft: Secondary | ICD-10-CM | POA: Diagnosis not present

## 2020-03-19 DIAGNOSIS — I509 Heart failure, unspecified: Secondary | ICD-10-CM | POA: Diagnosis not present

## 2020-03-19 DIAGNOSIS — Z860100 Personal history of colon polyps, unspecified: Secondary | ICD-10-CM

## 2020-03-19 DIAGNOSIS — J449 Chronic obstructive pulmonary disease, unspecified: Secondary | ICD-10-CM | POA: Diagnosis not present

## 2020-03-19 DIAGNOSIS — I252 Old myocardial infarction: Secondary | ICD-10-CM | POA: Diagnosis not present

## 2020-03-19 DIAGNOSIS — Z79899 Other long term (current) drug therapy: Secondary | ICD-10-CM | POA: Insufficient documentation

## 2020-03-19 DIAGNOSIS — K219 Gastro-esophageal reflux disease without esophagitis: Secondary | ICD-10-CM | POA: Diagnosis not present

## 2020-03-19 DIAGNOSIS — K5730 Diverticulosis of large intestine without perforation or abscess without bleeding: Secondary | ICD-10-CM | POA: Diagnosis not present

## 2020-03-19 DIAGNOSIS — Z87891 Personal history of nicotine dependence: Secondary | ICD-10-CM | POA: Insufficient documentation

## 2020-03-19 DIAGNOSIS — Z8711 Personal history of peptic ulcer disease: Secondary | ICD-10-CM | POA: Insufficient documentation

## 2020-03-19 DIAGNOSIS — K621 Rectal polyp: Secondary | ICD-10-CM | POA: Insufficient documentation

## 2020-03-19 DIAGNOSIS — I110 Hypertensive heart disease with heart failure: Secondary | ICD-10-CM | POA: Diagnosis not present

## 2020-03-19 DIAGNOSIS — Z9981 Dependence on supplemental oxygen: Secondary | ICD-10-CM | POA: Insufficient documentation

## 2020-03-19 DIAGNOSIS — Z8249 Family history of ischemic heart disease and other diseases of the circulatory system: Secondary | ICD-10-CM | POA: Diagnosis not present

## 2020-03-19 DIAGNOSIS — G4700 Insomnia, unspecified: Secondary | ICD-10-CM | POA: Insufficient documentation

## 2020-03-19 DIAGNOSIS — Z811 Family history of alcohol abuse and dependence: Secondary | ICD-10-CM | POA: Insufficient documentation

## 2020-03-19 DIAGNOSIS — K635 Polyp of colon: Secondary | ICD-10-CM | POA: Diagnosis not present

## 2020-03-19 DIAGNOSIS — R0602 Shortness of breath: Secondary | ICD-10-CM | POA: Insufficient documentation

## 2020-03-19 DIAGNOSIS — Z1211 Encounter for screening for malignant neoplasm of colon: Secondary | ICD-10-CM | POA: Insufficient documentation

## 2020-03-19 DIAGNOSIS — Z801 Family history of malignant neoplasm of trachea, bronchus and lung: Secondary | ICD-10-CM | POA: Insufficient documentation

## 2020-03-19 HISTORY — PX: COLONOSCOPY WITH PROPOFOL: SHX5780

## 2020-03-19 SURGERY — COLONOSCOPY WITH PROPOFOL
Anesthesia: General

## 2020-03-19 MED ORDER — SODIUM CHLORIDE 0.9 % IV SOLN: INTRAVENOUS | Status: DC

## 2020-03-19 MED ORDER — PROPOFOL 500 MG/50ML IV EMUL
INTRAVENOUS | Status: DC | PRN
  Administered 2020-03-19: 150 ug/kg/min via INTRAVENOUS

## 2020-03-19 MED ORDER — PROPOFOL 500 MG/50ML IV EMUL
INTRAVENOUS | Status: AC
  Filled 2020-03-19: qty 50

## 2020-03-19 MED ORDER — PROPOFOL 10 MG/ML IV BOLUS
INTRAVENOUS | Status: DC | PRN
  Administered 2020-03-19: 50 mg via INTRAVENOUS

## 2020-03-19 NOTE — H&P (Signed)
Leslie Darby, MD [address]  [place]  [place], [place] [address]  Main: [phone]  Fax: [phone] Pager: [phone]  Primary Care Physician:  [name], MD Primary Gastroenterologist:  Dr. Cephas Sims  Pre-Procedure History & Physical: HPI:  Leslie Sims is a 69 y.o. male is here for an colonoscopy.   Past Medical History:  Diagnosis Date  . Allergic rhinitis   . Anxiety   . Back pain   . Colon polyps 08/2012   colonoscopy; multiple colon polyps; repeat colonoscopy in one year.  [name] COPD (chronic obstructive pulmonary disease) ([place])    a. on home O2.  . Coronary artery disease 11/2009   a. late presenting ant MI; b. LHC 100% pLAD s/p PCI/DES, 99% mRCA s/p PCI/DES, EF 35%; c. nuclear stress test 05/13: prior ant/inf infarcts w/o ischemia, EF 43%; d. LHC 02/14: widely patent stents with no other obs dz, EF 40%; e. LHC 11/17: LM nl, mLAD 10%, patent LAD stent, dLAD 20%, p-mRCA 10%, mRCA 40%, patent RCA stent, EF 35-45%   . Depression   . GERD (gastroesophageal reflux disease)   . Headache(784.0)   . Helicobacter pylori (H. pylori)   . Hemorrhoids   . Hernia    inguinal  . HFrEF (heart failure with reduced ejection fraction) ([place])    a. 10/2016 LV gram: EF 35-45%; b. 06/2018 Echo: EF 40-45%.  . Hyperlipidemia   . Hypertension   . Insomnia   . Ischemic cardiomyopathy    a. 10/2016 LV gram: EF 35-45%; b. 06/2018 Echo: EF 40-45%.  . Myalgia   . Peptic ulcer   . Pulmonary nodules   . ST elevation (STEMI) myocardial infarction involving left anterior descending coronary artery ([place]) 11/2009   a. s/p PCI to the LAD  . Status post dilation of esophageal narrowing 2000  . Tinea pedis   . Wears dentures    partial upper    Past Surgical History:  Procedure Laterality Date  . Admission  12/20/2012   COPD exacerbation.  [place].  [name] CARDIAC CATHETERIZATION    . CARDIAC CATHETERIZATION  02/05/13   [facility]  . CARDIAC CATHETERIZATION  10/14   [facility] : patent  stents with no change in anatomy. EF: 40$  . CARDIAC CATHETERIZATION Left 11/12/2016   Procedure: Left Heart Cath and Coronary Angiography;  Surgeon: Wellington Hampshire, MD;  Location: [place] [facility];  Service: Cardiovascular;  Laterality: Left;  . COLONOSCOPY    . COLONOSCOPY WITH PROPOFOL N/A 05/18/2016   Procedure: COLONOSCOPY WITH PROPOFOL;  Surgeon: Lucilla Lame, MD;  Location: [facility];  Service: Endoscopy;  Laterality: N/A;  . CORONARY ANGIOPLASTY WITH STENT PLACEMENT  09/2009   LAD 3.0 X23 mm Xience DES, RCA: 4.0 X 15 mm Xience DES  . ELECTROMAGNETIC NAVIGATION BROCHOSCOPY N/A 10/27/2017   Procedure: ELECTROMAGNETIC NAVIGATION BRONCHOSCOPY;  Surgeon: Flora Lipps, MD;  Location: [facility];  Service: Cardiopulmonary;  Laterality: N/A;  . ESOPHAGEAL DILATION    . ESOPHAGOGASTRODUODENOSCOPY  2008  . ESOPHAGOGASTRODUODENOSCOPY  08/20/2012  . HERNIA REPAIR  07/20/2012   L inguinal hernia repair  . PENILE PROSTHESIS IMPLANT      Prior to Admission medications   Medication Sig Start Date End Date Taking? Authorizing Provider  albuterol (PROVENTIL HFA;VENTOLIN HFA) 108 (90 Base) MCG/ACT inhaler Inhale 2 puffs into the lungs every 6 (six) hours as needed for wheezing or shortness of breath. 11/01/18  Yes Alfred Levins, [place], MD  aspirin 81 MG tablet Take 81 mg  by mouth daily.     Yes [provider]  atorvastatin (LIPITOR) 40 MG tablet Take 1 tablet (40 mg total) by mouth daily. 03/03/18  Yes [name], MD  Fluticasone-Salmeterol (ADVAIR) 500-50 MCG/DOSE AEPB Inhale 1 puff into the lungs every 12 (twelve) hours. 11/29/18  Yes [provider]  furosemide (LASIX) 20 MG tablet Take 20 mg by mouth.   Yes [provider]  hydrOXYzine (VISTARIL) 25 MG capsule Take 25 mg by mouth 2 (two) times daily as needed for anxiety or itching.  06/05/18  Yes [provider]  ipratropium-albuterol (DUONEB) 0.5-2.5 (3) MG/3ML SOLN USE 1 VIAL VIA NEBULIZER AND INHALE BY  MOUTH 4 TIMES A DAY 01/21/20  Yes [name], MD  LORazepam (ATIVAN) 1 MG tablet Take 1 tablet (1 mg total) by mouth every 4 (four) hours while awake. 02/14/19  Yes [name], MD  losartan (COZAAR) 25 MG tablet Take 25 mg by mouth daily. 02/23/19  Yes [provider]  Multiple Vitamin (MULTIVITAMIN) capsule Take 1 capsule by mouth daily.   Yes [provider]  OLANZapine (ZYPREXA) 2.5 MG tablet Take 2.5 mg by mouth at bedtime. 01/19/19  Yes [provider]  OXYGEN Inhale 3 L into the lungs daily.    Yes [provider]  doxycycline (VIBRAMYCIN) 100 MG capsule Take 1 capsule (100 mg total) by mouth 2 (two) times daily. Patient not taking: Reported on 03/19/2020 11/05/19   Leslie Land, NP  furosemide (LASIX) 20 MG tablet Take 1 tablet (20 mg total) by mouth daily. 07/07/18 12/04/19  [name], MD  guaiFENesin-dextromethorphan (ROBITUSSIN DM) 100-10 MG/5ML syrup Take 5 mLs by mouth every 4 (four) hours as needed for cough. Patient not taking: Reported on 03/19/2020 01/31/19   [name], MD  mirtazapine (REMERON) 30 MG tablet Take 1 tablet (30 mg total) by mouth at bedtime. 06/24/18   [name], MD  nitroGLYCERIN (NITROSTAT) 0.4 MG SL tablet Place 1 tablet (0.4 mg total) under the tongue every 5 (five) minutes as needed. 10/17/17   Wellington Hampshire, MD    Allergies as of 03/17/2020 - Review Complete 12/04/2019  Allergen Reaction Noted  . Prozac [fluoxetine hcl] Shortness Of Breath 05/03/2018  . Effexor xr [venlafaxine hcl er] Other (See Comments) 12/01/2016  . Wellbutrin [bupropion] Other (See Comments) 07/21/2012    Family History  Problem Relation Age of Onset  . Heart attack Brother        Brother #1  . Diabetes Brother   . Hypertension Brother        #3  . Coronary artery disease Father 26       deceased  . Heart attack Father   . Diabetes Father   . Heart disease Father   . COPD Mother 30       deceased  . Alcohol abuse  Sister        polysubstance abuse  . COPD Sister   . Lung cancer Sister   . Alcohol abuse Sister        polysubstance abuse  . Penile cancer Brother   . Diabetes Brother     Social History   Socioeconomic History  . Marital status: Widowed    Spouse name: Not on file  . Number of children: 5  . Years of education: Not on file  . Highest education level: Not on file  Occupational History  . 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  Smoking status: Former Smoker    Packs/day: 1.00    Years: 42.00    Pack years: 42.00    Types: Cigarettes    Quit date: 02/10/2019    Years since quitting: 1.1  . Smokeless tobacco: Never Used  Substance and Sexual Activity  . Alcohol use: No    Alcohol/week: 0.0 standard drinks  . Drug use: No  . Sexual activity: Yes  Other Topics Concern  . Not on file  Social History Narrative   Marital status: married x 32 years; happily married.      Children: 4 children;  1 stepchild and 15 grandchildren; no great grandchildren.      Lives: with wife; 2 dogs, 1 cat.      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      Tobacco: 1 ppd x 45 years      Alcohol: none; previous alcoholism      Drugs: none      Exercise: no formal exercise; physically demanding job; owns horses.      Seatbelts:  100%      Guns: one loaded unsecured gun in the home.      Sunscreen: none   Social Determinants of Radio broadcast assistant Strain:   . Difficulty of Paying Living Expenses:   Food Insecurity:   . Worried About Charity fundraiser in the Last Year:   . Arboriculturist in the Last Year:   Transportation Needs:   . Film/video editor (Medical):   [name] Lack of Transportation (Non-Medical):   Physical Activity:   . Days of Exercise per Week:   . Minutes of Exercise per Session:   Stress:   . Feeling of Stress :   Social Connections:   . Frequency of Communication with Friends and Family:   .  Frequency of Social Gatherings with Friends and Family:   . Attends Religious Services:   . Active Member of Clubs or Organizations:   . Attends Archivist Meetings:   [name] Marital Status:   Intimate Partner Violence:   . Fear of Current or Ex-Partner:   . Emotionally Abused:   [name] Physically Abused:   . Sexually Abused:     Review of Systems: See HPI, otherwise negative ROS  Physical Exam: BP (!) 157/91   Pulse 94   Temp (!) 97.4 F (36.3 C) (Temporal)   Resp (!) 22   Ht 5\' 10"  (1.778 m)   Wt 83.9 kg   SpO2 100%   BMI 26.54 kg/m  General:   Alert,  pleasant and cooperative in NAD Head:  Normocephalic and atraumatic. Neck:  Supple; no masses or thyromegaly. Lungs:  Clear throughout to auscultation.    Heart:  Regular rate and rhythm. Abdomen:  Soft, nontender and nondistended. Normal bowel sounds, without guarding, and without rebound.   Neurologic:  Alert and  oriented x4;  grossly normal neurologically.  Impression/Plan: Lavonne Amparano is here for an colonoscopy to be performed for personal h/o colon polyps  Risks, benefits, limitations, and alternatives regarding  colonoscopy have been reviewed with the patient.  Questions have been answered.  All parties agreeable.   Sherri Sear, MD  03/19/2020, 8:43 AM

## 2020-03-19 NOTE — Anesthesia Preprocedure Evaluation (Signed)
Anesthesia Evaluation  Patient identified by MRN, date of birth, ID band Patient awake    Reviewed: Allergy & Precautions, NPO status , Patient's Chart, lab work & pertinent test results, reviewed documented beta blocker date and time   History of Anesthesia Complications Negative for: history of anesthetic complications  Airway Mallampati: II  TM Distance: >3 FB Neck ROM: Full    Dental no notable dental hx. (+) Partial Upper, Dental Advidsory Given, Poor Dentition, Partial Lower   Pulmonary shortness of breath and with exertion, neg sleep apnea, COPD,  COPD inhaler and oxygen dependent, neg recent URI, Patient abstained from smoking.Not current smoker, former smoker,     + decreased breath sounds      Cardiovascular Exercise Tolerance: Poor hypertension, Pt. on medications (-) angina+ CAD, + Past MI, + Cardiac Stents and +CHF  (-) CABG (-) dysrhythmias (-) Valvular Problems/Murmurs Rhythm:Regular Rate:Normal - Systolic murmurs TTe [id number]: - Left ventricle: The cavity size was normal. Wall thickness was  normal. Systolic function was mildly to moderately reduced. The  estimated ejection fraction was in the range of 40% to 45%. The  study is not technically sufficient to allow evaluation of LV  diastolic function.  - Pulmonary arteries: Systolic pressure could not be accurately  estimated.    Neuro/Psych  Headaches, neg Seizures PSYCHIATRIC DISORDERS Anxiety Depression    GI/Hepatic Neg liver ROS, PUD, GERD  ,  Endo/Other  diabetes (Pre-DM)  Renal/GU negative Renal ROS     Musculoskeletal   Abdominal   Peds  Hematology negative hematology ROS (+)   Anesthesia Other Findings Past Medical History: No date: Allergic rhinitis No date: Anxiety No date: Atherosclerosis No date: Back pain No date: Bronchitis     Comment:  09-14-11 03/24/2010: CARDIOMYOPATHY     Comment:  did not tolerate Toprol and Lisinopril.   No date: Chest pain No date: Chronic airway obstruction, not elsewhere classified 08/2012: Colon polyps     Comment:  colonoscopy; multiple colon polyps; repeat colonoscopy               in one year. No date: COPD (chronic obstructive pulmonary disease) ([place]) 11/2009: Coronary artery disease     Comment:  Anterior MI with late presentation 11/2009. Cath showed               a 100% proximal LAD occlusion, 99% mid RCA. Had a PCI and              2 DES to both LAD and RCA. EF was 35%. Nuclear stress               test 05/13: prior anterior/inferior infarcts without               ischemic, EF 43%, cardiac cath in 02/14: Widely patent               stents with no other obstructive disease. EF 40%  No date: Depression No date: Diabetes mellitus without complication (HCC) No date: GERD (gastroesophageal reflux disease) No date: [id number]) No date: Helicobacter pylori (H. pylori) No date: Hemorrhoids No date: Hernia     Comment:  inguinal No date: Hyperlipidemia No date: Hypertension No date: Insomnia No date: MI (myocardial infarction) ([place]) No date: Myalgia No date: Peptic ulcer No date: Shortness of breath dyspnea 2000: Status post dilation of esophageal narrowing No date: Thyroid dysfunction No date: Tinea pedis No date: Wears dentures     Comment:  partial upper   Reproductive/Obstetrics  Anesthesia Physical  Anesthesia Plan  ASA: III  Anesthesia Plan: General   Post-op Pain Management:    Induction: Intravenous  PONV Risk Score and Plan: 2 and Ondansetron, Dexamethasone, TIVA and Propofol infusion  Airway Management Planned: Oral ETT  Additional Equipment: None  Intra-op Plan:   Post-operative Plan: Extubation in OR  Informed Consent: I have reviewed the patients History and Physical, chart, labs and discussed the procedure including the risks, benefits and alternatives for the proposed anesthesia with the  patient or authorized representative who has indicated his/her understanding and acceptance.     Dental advisory given  Plan Discussed with: CRNA  Anesthesia Plan Comments: (Discussed risks of anesthesia with patient, including possibility of difficulty with spontaneous ventilation under anesthesia necessitating airway intervention, PONV, and rare risks such as cardiac or respiratory or neurological events. Patient understands.)        Anesthesia Quick Evaluation

## 2020-03-19 NOTE — Anesthesia Procedure Notes (Signed)
Date/Time: 03/19/2020 8:56 AM Performed by: Nelda Marseille, CRNA Pre-anesthesia Checklist: Patient identified, Emergency Drugs available, Suction available, Patient being monitored and Timeout performed Oxygen Delivery Method: Nasal cannula

## 2020-03-19 NOTE — Transfer of Care (Signed)
Immediate Anesthesia Transfer of Care Note  Patient: Leslie Sims  Procedure(s) Performed: COLONOSCOPY WITH PROPOFOL (N/A )  Patient Location: PACU  Anesthesia Type:General  Level of Consciousness: sedated  Airway & Oxygen Therapy: Patient Spontanous Breathing and Patient connected to nasal cannula oxygen  Post-op Assessment: Report given to RN and Post -op Vital signs reviewed and stable  Post vital signs: Reviewed and stable  Last Vitals:  Vitals Value Taken Time  BP 94/50 03/19/20 0917  Temp    Pulse 78 03/19/20 0917  Resp 12 03/19/20 0917  SpO2 96 % 03/19/20 0917  Vitals shown include unvalidated device data.  Last Pain:  Vitals:   03/19/20 0808  TempSrc: Temporal  PainSc: 0-No pain         Complications: No apparent anesthesia complications

## 2020-03-19 NOTE — Anesthesia Postprocedure Evaluation (Signed)
Anesthesia Post Note  Patient: Leslie Sims  Procedure(s) Performed: COLONOSCOPY WITH PROPOFOL (N/A )  Patient location during evaluation: Endoscopy Anesthesia Type: General Level of consciousness: awake and alert Pain management: pain level controlled Vital Signs Assessment: post-procedure vital signs reviewed and stable Respiratory status: spontaneous breathing, nonlabored ventilation, respiratory function stable and patient connected to nasal cannula oxygen Cardiovascular status: blood pressure returned to baseline and stable Postop Assessment: no apparent nausea or vomiting Anesthetic complications: no     Last Vitals:  Vitals:   03/19/20 0937 03/19/20 0947  BP: (!) 128/97 135/74  Pulse: 73 68  Resp: 15 (!) 9  Temp:    SpO2: 100% 100%    Last Pain:  Vitals:   03/19/20 0947  TempSrc:   PainSc: 0-No pain                 Arita Miss

## 2020-03-19 NOTE — Op Note (Signed)
[facility] Patient Name: Leslie Sims Procedure Date: 03/19/2020 8:48 AM MRN: [id number] Account #: [id number] Date of Birth: [birth year] Admit Type: Outpatient Age: 69 Room: [facility] ENDO ROOM 3 Gender: Male Note Status: Finalized Procedure:             Colonoscopy Indications:           Surveillance: Personal history of adenomatous polyps                         on last colonoscopy 5 years ago, Last colonoscopy: May                         2017 Providers:             Lin Landsman MD, MD Medicines:             Monitored Anesthesia Care Complications:         No immediate complications. Estimated blood loss: None. Procedure:             Pre-Anesthesia Assessment:                        - Prior to the procedure, a History and Physical was                         performed, and patient medications and allergies were                         reviewed. The patient is competent. The risks and                         benefits of the procedure and the sedation options and                         risks were discussed with the patient. All questions                         were answered and informed consent was obtained.                         Patient identification and proposed procedure were                         verified by the physician, the nurse, the                         anesthesiologist, the anesthetist and the technician                         in the pre-procedure area in the procedure room in the                         endoscopy suite. Mental Status Examination: alert and                         oriented. Airway Examination: normal oropharyngeal                         airway and neck mobility. Respiratory Examination:  clear to auscultation. CV Examination: normal.                         Prophylactic Antibiotics: The patient does not require                         prophylactic antibiotics. Prior  Anticoagulants: The                         patient has taken no previous anticoagulant or                         antiplatelet agents. ASA Grade Assessment: III - A                         patient with severe systemic disease. After reviewing                         the risks and benefits, the patient was deemed in                         satisfactory condition to undergo the procedure. The                         anesthesia plan was to use monitored anesthesia care                         (MAC). Immediately prior to administration of                         medications, the patient was re-assessed for adequacy                         to receive sedatives. The heart rate, respiratory                         rate, oxygen saturations, blood pressure, adequacy of                         pulmonary ventilation, and response to care were                         monitored throughout the procedure. The physical                         status of the patient was re-assessed after the                         procedure.                        After obtaining informed consent, the colonoscope was                         passed under direct vision. Throughout the procedure,                         the patient's blood pressure, pulse, and oxygen  saturations were monitored continuously. The                         Colonoscope was introduced through the anus and                         advanced to the the cecum, identified by appendiceal                         orifice and ileocecal valve. The colonoscopy was                         performed without difficulty. The patient tolerated                         the procedure well. The quality of the bowel                         preparation was evaluated using the BBPS [facility] Bowel                         Preparation Scale) with scores of: Right Colon = 3,                         Transverse Colon = 3 and Left Colon = 3 (entire mucosa                          seen well with no residual staining, small fragments                         of stool or opaque liquid). The total BBPS score                         equals 9. Findings:      The perianal and digital rectal examinations were normal. Pertinent       negatives include normal sphincter tone and no palpable rectal lesions.      Three sessile polyps were found in the rectum and descending colon. The       polyps were 5 mm in size. These polyps were removed with a cold snare.       Resection and retrieval were complete.      Multiple diverticula were found in the sigmoid colon.      Non-bleeding external hemorrhoids were found during retroflexion. The       hemorrhoids were medium-sized. Impression:            - Three 5 mm polyps in the rectum and in the                         descending colon, removed with a cold snare. Resected                         and retrieved.                        - Diverticulosis in the sigmoid colon.                        -  Non-bleeding external hemorrhoids. Recommendation:        - Discharge patient to home (with escort).                        - Resume previous diet today.                        - Continue present medications.                        - Await pathology results.                        - Repeat colonoscopy in 5 years for surveillance.                        - Refer to a genetics counselor at appointment to be                         scheduled. Procedure Code(s):     --- Professional ---                        [id number], Colonoscopy, flexible; with removal of                         tumor(s), polyp(s), or other lesion(s) by snare                         technique Diagnosis Code(s):     --- Professional ---                        Z86.010, Personal history of colonic polyps                        K62.1, Rectal polyp                        K63.5, Polyp of colon                        K64.4, Residual hemorrhoidal skin tags                         K57.30, Diverticulosis of large intestine without                         perforation or abscess without bleeding CPT copyright 2019 [employer]. All rights reserved. The codes documented in this report are preliminary and upon coder review may  be revised to meet current compliance requirements. Dr. Ulyess Mort Lin Landsman MD, MD 03/19/2020 9:14:41 AM This report has been signed electronically. Number of Addenda: 0 Note Initiated On: 03/19/2020 8:48 AM Scope Withdrawal Time: 0 hours 14 minutes 29 seconds  Total Procedure Duration: 0 hours 17 minutes 36 seconds  Estimated Blood Loss:  Estimated blood loss: none.      [facility]

## 2020-03-20 ENCOUNTER — Encounter: Payer: Self-pay | Admitting: *Deleted

## 2020-03-20 ENCOUNTER — Encounter: Payer: Self-pay | Admitting: Gastroenterology

## 2020-03-20 LAB — SURGICAL PATHOLOGY

## 2020-04-02 ENCOUNTER — Emergency Department: Payer: Medicare Other

## 2020-04-02 ENCOUNTER — Emergency Department
Admission: EM | Admit: 2020-04-02 | Discharge: 2020-04-02 | Disposition: A | Discharge status: Home / Self Care | Payer: Medicare Other | Attending: Emergency Medicine | Admitting: Emergency Medicine

## 2020-04-02 ENCOUNTER — Other Ambulatory Visit: Payer: Self-pay

## 2020-04-02 DIAGNOSIS — I2510 Atherosclerotic heart disease of native coronary artery without angina pectoris: Secondary | ICD-10-CM | POA: Diagnosis not present

## 2020-04-02 DIAGNOSIS — R0602 Shortness of breath: Secondary | ICD-10-CM | POA: Diagnosis present

## 2020-04-02 DIAGNOSIS — I10 Essential (primary) hypertension: Secondary | ICD-10-CM | POA: Insufficient documentation

## 2020-04-02 DIAGNOSIS — R0600 Dyspnea, unspecified: Secondary | ICD-10-CM

## 2020-04-02 DIAGNOSIS — J441 Chronic obstructive pulmonary disease with (acute) exacerbation: Secondary | ICD-10-CM | POA: Diagnosis not present

## 2020-04-02 DIAGNOSIS — Z79899 Other long term (current) drug therapy: Secondary | ICD-10-CM | POA: Insufficient documentation

## 2020-04-02 DIAGNOSIS — Z87891 Personal history of nicotine dependence: Secondary | ICD-10-CM | POA: Diagnosis not present

## 2020-04-02 DIAGNOSIS — Z7982 Long term (current) use of aspirin: Secondary | ICD-10-CM | POA: Insufficient documentation

## 2020-04-02 LAB — BASIC METABOLIC PANEL
Anion gap: 10 (ref 5–15)
BUN: 18 mg/dL (ref 8–23)
CO2: 33 mmol/L — ABNORMAL HIGH (ref 22–32)
Calcium: 9.6 mg/dL (ref 8.9–10.3)
Chloride: 97 mmol/L — ABNORMAL LOW (ref 98–111)
Creatinine, Ser: 1 mg/dL (ref 0.61–1.24)
GFR calc Af Amer: >60 mL/min (ref >60)
GFR calc non Af Amer: >60 mL/min (ref >60)
Glucose, Bld: 103 mg/dL — ABNORMAL HIGH (ref 70–99)
Potassium: 4.3 mmol/L (ref 3.5–5.1)
Sodium: 140 mmol/L (ref 135–145)

## 2020-04-02 LAB — CBC
HCT: 40.1 % (ref 39.0–52.0)
Hemoglobin: 12.7 g/dL — ABNORMAL LOW (ref 13.0–17.0)
MCH: 29.3 pg (ref 26.0–34.0)
MCHC: 31.7 g/dL (ref 30.0–36.0)
MCV: 92.4 fL (ref 80.0–100.0)
Platelets: 259 10*3/uL (ref 150–400)
RBC: 4.34 MIL/uL (ref 4.22–5.81)
RDW: 12.2 % (ref 11.5–15.5)
WBC: 8.1 10*3/uL (ref 4.0–10.5)
nRBC: 0 % (ref 0.0–0.2)

## 2020-04-02 LAB — TROPONIN I (HIGH SENSITIVITY): Troponin I (High Sensitivity): 8 ng/L (ref <18)

## 2020-04-02 IMAGING — CR DG CHEST 2V
2 series · 2 of 2 positions shown · non-contrast
Comparison: [DATE] [DATE], [DATE]. [DATE] [DATE], [DATE].

CLINICAL DATA: Shortness of breath, chest pain.

EXAM:
CHEST - 2 VIEW

[chest pa]
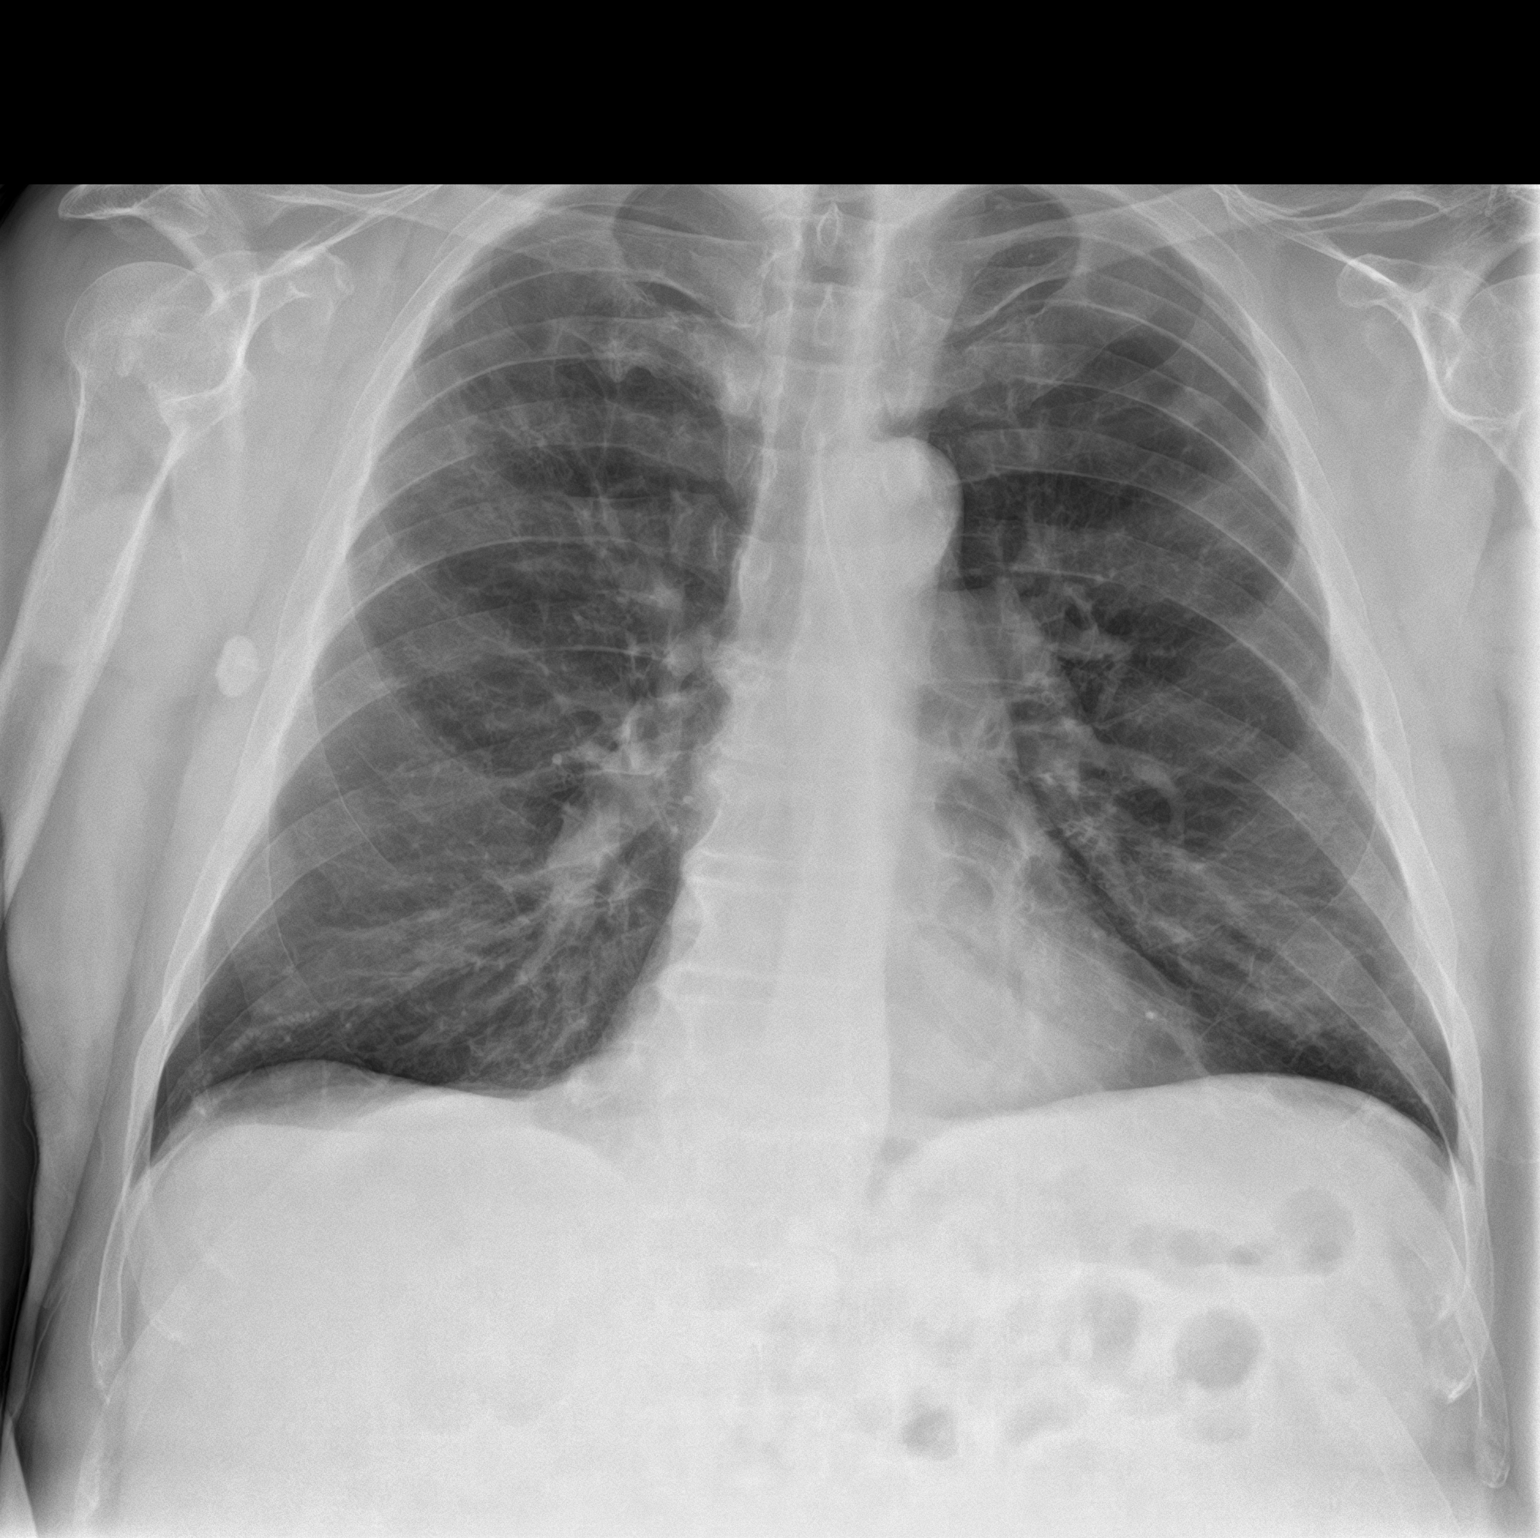

[chest lat]
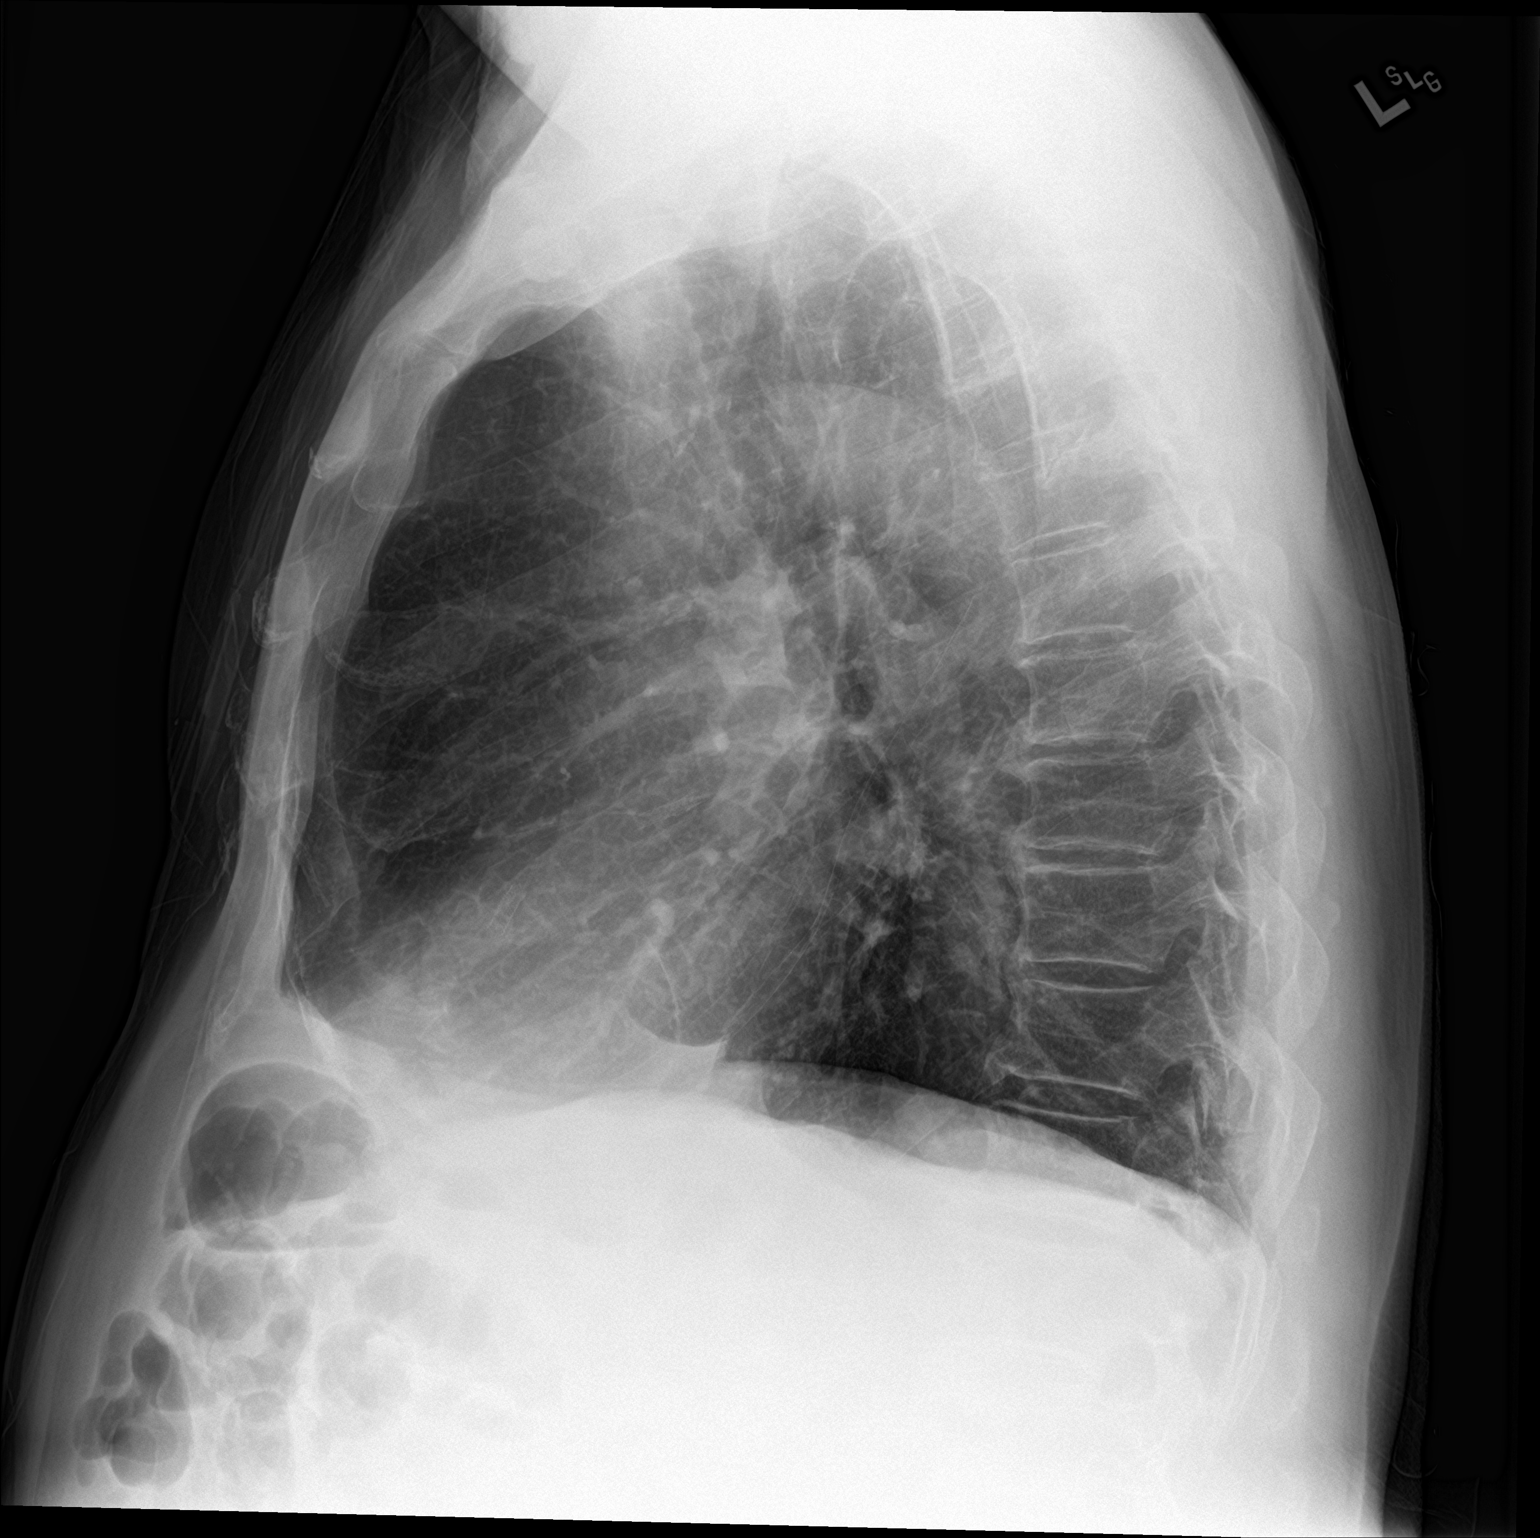

[2 of 2 positions shown; findings below may reference images not displayed]

FINDINGS: The heart size and mediastinal contours are within normal limits. No
pneumothorax or pleural effusion is noted. Stable nodular density is
noted in left upper lobe. No consolidative process is noted. The
visualized skeletal structures are unremarkable.
IMPRESSION: Stable nodular density seen in left upper lobe. No acute
cardiopulmonary abnormality seen.

## 2020-04-02 NOTE — ED Triage Notes (Signed)
Pt comes EMS with increasing SOB and left chest pain. Pt normally on 2L [place] chronically or 4L when exerting himself. Pt recently treated for bronchitis. Pt still finishing antibiotics.

## 2020-04-02 NOTE — ED Provider Notes (Signed)
[facility] Emergency Department Provider Note  Time seen: 6:19 PM  I have reviewed the triage vital signs and the nursing notes.   HISTORY  Chief Complaint Shortness of Breath   HPI Leslie Sims is a 69 y.o. male with a past medical history of anxiety, COPD on 2.5 L of oxygen 24/7, gastric reflux, hypertension, hyperlipidemia, prior MI, presents to the emergency department for shortness of breath.  According to the patient he has COPD and has fairly significant seasonal allergies.  States over the past 2 weeks he has had progressively worsening shortness of breath.  States he went to his doctor and was prescribed a nasal spray, went to urgent care twice over the past 2 weeks and has been prescribed a prednisone taper and an antibiotic which he is currently finishing.  Patient states he still felt somewhat short of breath today so he came to the emergency department for evaluation.  Patient denies any chest pain.  Has an appointment coming up with pulmonology on 04/21/2020.  Denies any fever denies any increased cough over baseline.   Past Medical History:  Diagnosis Date  . Allergic rhinitis   . Anxiety   . Back pain   . Colon polyps 08/2012   colonoscopy; multiple colon polyps; repeat colonoscopy in one year.  [name] COPD (chronic obstructive pulmonary disease) ([place])    a. on home O2.  . Coronary artery disease 11/2009   a. late presenting ant MI; b. LHC 100% pLAD s/p PCI/DES, 99% mRCA s/p PCI/DES, EF 35%; c. nuclear stress test 05/13: prior ant/inf infarcts w/o ischemia, EF 43%; d. LHC 02/14: widely patent stents with no other obs dz, EF 40%; e. LHC 11/17: LM nl, mLAD 10%, patent LAD stent, dLAD 20%, p-mRCA 10%, mRCA 40%, patent RCA stent, EF 35-45%   . Depression   . GERD (gastroesophageal reflux disease)   . Headache(784.0)   . Helicobacter pylori (H. pylori)   . Hemorrhoids   . Hernia    inguinal  . HFrEF (heart failure with reduced ejection fraction)  ([place])    a. 10/2016 LV gram: EF 35-45%; b. 06/2018 Echo: EF 40-45%.  . Hyperlipidemia   . Hypertension   . Insomnia   . Ischemic cardiomyopathy    a. 10/2016 LV gram: EF 35-45%; b. 06/2018 Echo: EF 40-45%.  . Myalgia   . Peptic ulcer   . Pulmonary nodules   . ST elevation (STEMI) myocardial infarction involving left anterior descending coronary artery ([place]) 11/2009   a. s/p PCI to the LAD  . Status post dilation of esophageal narrowing 2000  . Tinea pedis   . Wears dentures    partial upper    Patient Active Problem List   Diagnosis Date Noted  . Goals of care, counseling/discussion   . Palliative care by specialist   . Healthcare-associated pneumonia 01/28/2019  . COPD with acute exacerbation ([place]) 12/31/2018  . Acute respiratory failure with hypoxia ([place]) 09/29/2018  . Acute respiratory failure with hypoxia and hypercapnia ([place]) 09/13/2018  . Acute on chronic respiratory failure with hypoxia and hypercapnia ([place]) 08/24/2018  . Chest pain 07/02/2018  . Grief reaction 01/13/2018  . COPD with hypoxia ([place]) 12/25/2017  . Unstable angina ([place])   . Benign neoplasm of ascending colon   . Benign neoplasm of descending colon   . Benign neoplasm of sigmoid colon   . History of colonic polyps   . Pulmonary nodules/lesions, multiple 01/05/2015  . Bruit of left carotid artery 11/04/2014  .  Sleep disorder 07/18/2013  . Seasonal and perennial allergic rhinitis 05/29/2013  . Bradycardia 04/20/2011  . SMOKER 07/30/2010  . CAD, NATIVE VESSEL 04/23/2010  . Hyperlipidemia 03/24/2010  . DEPRESSION/ANXIETY 03/24/2010  . Chronic systolic heart failure ([place]) 03/24/2010  . COPD, very severe ([place]) 03/24/2010  . GERD 03/24/2010  . Essential hypertension 11/21/2009    Past Surgical History:  Procedure Laterality Date  . Admission  12/20/2012   COPD exacerbation.  [place].  [name] CARDIAC CATHETERIZATION    . CARDIAC CATHETERIZATION  02/05/13   [facility]  . CARDIAC CATHETERIZATION  10/14   [facility] : patent  stents with no change in anatomy. EF: 40$  . CARDIAC CATHETERIZATION Left 11/12/2016   Procedure: Left Heart Cath and Coronary Angiography;  Surgeon: Wellington Hampshire, MD;  Location: [place] [facility];  Service: Cardiovascular;  Laterality: Left;  . COLONOSCOPY    . COLONOSCOPY WITH PROPOFOL N/A 05/18/2016   Procedure: COLONOSCOPY WITH PROPOFOL;  Surgeon: Lucilla Lame, MD;  Location: [facility] ENDOSCOPY;  Service: Endoscopy;  Laterality: N/A;  . COLONOSCOPY WITH PROPOFOL N/A 03/19/2020   Procedure: COLONOSCOPY WITH PROPOFOL;  Surgeon: Lin Landsman, MD;  Location: [facility] ENDOSCOPY;  Service: Gastroenterology;  Laterality: N/A;  . CORONARY ANGIOPLASTY WITH STENT PLACEMENT  09/2009   LAD 3.0 X23 mm Xience DES, RCA: 4.0 X 15 mm Xience DES  . ELECTROMAGNETIC NAVIGATION BROCHOSCOPY N/A 10/27/2017   Procedure: ELECTROMAGNETIC NAVIGATION BRONCHOSCOPY;  Surgeon: Flora Lipps, MD;  Location: [facility] ORS;  Service: Cardiopulmonary;  Laterality: N/A;  . ESOPHAGEAL DILATION    . ESOPHAGOGASTRODUODENOSCOPY  2008  . ESOPHAGOGASTRODUODENOSCOPY  08/20/2012  . HERNIA REPAIR  07/20/2012   L inguinal hernia repair  . PENILE PROSTHESIS IMPLANT      Prior to Admission medications   Medication Sig Start Date End Date Taking? Authorizing Provider  albuterol (PROVENTIL HFA;VENTOLIN HFA) 108 (90 Base) MCG/ACT inhaler Inhale 2 puffs into the lungs every 6 (six) hours as needed for wheezing or shortness of breath. 11/01/18   Rudene Re, MD  aspirin 81 MG tablet Take 81 mg by mouth daily.      [provider]  atorvastatin (LIPITOR) 40 MG tablet Take 1 tablet (40 mg total) by mouth daily. 03/03/18   [name], MD  Fluticasone-Salmeterol (ADVAIR) 500-50 MCG/DOSE AEPB Inhale 1 puff into the lungs every 12 (twelve) hours. 11/29/18   [provider]  furosemide (LASIX) 20 MG tablet Take 1 tablet (20 mg total) by mouth daily. 07/07/18 12/04/19  [name], MD  furosemide (LASIX) 20 MG tablet Take  20 mg by mouth.    [provider]  guaiFENesin-dextromethorphan (ROBITUSSIN DM) 100-10 MG/5ML syrup Take 5 mLs by mouth every 4 (four) hours as needed for cough. Patient not taking: Reported on 03/19/2020 01/31/19   [name], MD  hydrOXYzine (VISTARIL) 25 MG capsule Take 25 mg by mouth 2 (two) times daily as needed for anxiety or itching.  06/05/18   [provider]  ipratropium-albuterol (DUONEB) 0.5-2.5 (3) MG/3ML SOLN USE 1 VIAL VIA NEBULIZER AND INHALE BY MOUTH 4 TIMES A DAY 01/21/20   [name], MD  LORazepam (ATIVAN) 1 MG tablet Take 1 tablet (1 mg total) by mouth every 4 (four) hours while awake. 02/14/19   [name], MD  losartan (COZAAR) 25 MG tablet Take 25 mg by mouth daily. 02/23/19   [provider]  mirtazapine (REMERON) 30 MG tablet Take 1 tablet (30 mg total) by mouth at bedtime. 06/24/18  [name], MD  Multiple Vitamin (MULTIVITAMIN) capsule Take 1 capsule by mouth daily.    [provider]  nitroGLYCERIN (NITROSTAT) 0.4 MG SL tablet Place 1 tablet (0.4 mg total) under the tongue every 5 (five) minutes as needed. 10/17/17   Wellington Hampshire, MD  OLANZapine (ZYPREXA) 2.5 MG tablet Take 2.5 mg by mouth at bedtime. 01/19/19   [provider]  OXYGEN Inhale 3 L into the lungs daily.     [provider]    Allergies  Allergen Reactions  . Prozac [Fluoxetine Hcl] Shortness Of Breath  . Effexor Xr [Venlafaxine Hcl Er] Other (See Comments)    "Makes me feel funny"  . Wellbutrin [Bupropion] Other (See Comments)    "Makes me feel funny"    Family History  Problem Relation Age of Onset  . Heart attack Brother        Brother #1  . Diabetes Brother   . Hypertension Brother        #3  . Coronary artery disease Father 61       deceased  . Heart attack Father   . Diabetes Father   . Heart disease Father   . COPD Mother 79       deceased  . Alcohol abuse Sister        polysubstance abuse  . COPD Sister   .  Lung cancer Sister   . Alcohol abuse Sister        polysubstance abuse  . Penile cancer Brother   . Diabetes Brother     Social History Social History   Tobacco Use  . Smoking status: Former Smoker    Packs/day: 1.00    Years: 42.00    Pack years: 42.00    Types: Cigarettes    Quit date: 02/10/2019    Years since quitting: 1.1  . Smokeless tobacco: Never Used  Substance Use Topics  . Alcohol use: No    Alcohol/week: 0.0 standard drinks  . Drug use: No    Review of Systems Constitutional: Negative for fever. Cardiovascular: Negative for chest pain. Respiratory: Positive shortness of breath.  Negative for increased cough. Gastrointestinal: Negative for abdominal pain, vomiting  Musculoskeletal: Negative for leg pain or swelling Neurological: Negative for headache All other ROS negative  ____________________________________________   PHYSICAL EXAM:  VITAL SIGNS: ED Triage Vitals  Enc Vitals Group     BP 04/02/20 1626 130/76     Pulse Rate 04/02/20 1626 68     Resp 04/02/20 1626 18     Temp 04/02/20 1626 98.3 F (36.8 C)     Temp Source 04/02/20 1626 Oral     SpO2 04/02/20 1626 100 %     Weight 04/02/20 1627 182 lb 15.7 oz (83 kg)     Height 04/02/20 1627 5\' 10"  (1.778 m)     Head Circumference --      Peak Flow --      Pain Score 04/02/20 1626 3     Pain Loc --      Pain Edu? --      Excl. in [place]? --    Constitutional: Alert and oriented. Well appearing and in no distress. Eyes: Normal exam ENT      Head: Normocephalic and atraumatic.      Mouth/Throat: Mucous membranes are moist. Cardiovascular: Normal rate, regular rhythm.  Respiratory: Normal respiratory effort without tachypnea nor retractions.  No obvious wheeze rales or rhonchi currently.  Able to speak in full sentences.  Gastrointestinal: Soft and nontender. No distention.   Musculoskeletal: Nontender with normal range of motion in all extremities. No lower extremity tenderness or  edema. Neurologic:  Normal speech and language. No gross focal neurologic deficits  Skin:  Skin is warm, dry and intact.  Psychiatric: Mood and affect are normal.  ____________________________________________    EKG  EKG viewed and interpreted by myself shows a normal sinus rhythm at 6 7 bpm with a narrow QRS, normal axis, largely normal intervals nonspecific ST changes.  ____________________________________________    RADIOLOGY  Chest x-ray shows stable nodule left upper lobe otherwise negative.  ____________________________________________   INITIAL IMPRESSION / ASSESSMENT AND PLAN / ED COURSE  Pertinent labs & imaging results that were available during my care of the patient were reviewed by me and considered in my medical decision making (see chart for details).   Patient presents to the emergency department for continued shortness of breath over the past 2 weeks or so.  Currently patient appears well, reassuring vitals satting 100% on 2 L of oxygen.  Patient's labs are reassuring including a negative troponin.  Chest x-ray is clear besides a stable nodule.  Patient states he quit smoking 13 months ago.  Patient has an appointment with pulmonology on 04/21/2020.  I have sent a message to Dr. Patsey Berthold in an attempt to try to get the patient in earlier.  Patient is completing his prednisone taper and antibiotic.  As the patient has reassuring vitals reassuring physical exam no respiratory distress I do believe the patient is safe for discharge home with outpatient follow-up.  Patient agreeable to plan of care.  Provided my normal shortness of breath return precautions.  Emilio Mitchelle [facility] was evaluated in Emergency Department on 04/02/2020 for the symptoms described in the history of present illness. He was evaluated in the context of the global COVID-19 pandemic, which necessitated consideration that the patient might be at risk for infection with the SARS-CoV-2 virus that causes  COVID-19. Institutional protocols and algorithms that pertain to the evaluation of patients at risk for COVID-19 are in a state of rapid change based on information released by regulatory bodies including the CDC and federal and state organizations. These policies and algorithms were followed during the patient's care in the ED.  ____________________________________________   FINAL CLINICAL IMPRESSION(S) / ED DIAGNOSES  Dyspnea   Harvest Dark, MD 04/02/20 [facility]

## 2020-04-02 NOTE — ED Triage Notes (Signed)
FIRST NURSE NOTE- been on abx and steroids for bronchitis.  Pt has felt SHOB on exertion.  98% RA with EMS.  [place] 2 L at baseline.  Negative covid 2 days ago.

## 2020-04-03 ENCOUNTER — Telehealth: Payer: Self-pay | Admitting: Pulmonary Disease

## 2020-04-03 NOTE — Telephone Encounter (Signed)
Received call from [facility]. Patient has recent CXR there and the nodule was noticed on it. This nodule we have been following, the radiologist would like the provider to compare the recent CXR with patient's last CT Chest in 10/2019.  They will be faxing form to [place] office.   Dr. Patsey Berthold please advise on comparison

## 2020-04-04 NOTE — Telephone Encounter (Signed)
I called [facility] and made them aware that Dr. Patsey Berthold states that there's no abnormalities and that its been previously biopsied. Nothing further is needed.

## 2020-04-10 ENCOUNTER — Other Ambulatory Visit: Payer: Self-pay

## 2020-04-10 ENCOUNTER — Emergency Department
Admission: EM | Admit: 2020-04-10 | Discharge: 2020-04-10 | Disposition: A | Discharge status: Home / Self Care | Payer: Medicare Other | Attending: Emergency Medicine | Admitting: Emergency Medicine

## 2020-04-10 ENCOUNTER — Emergency Department: Payer: Medicare Other

## 2020-04-10 DIAGNOSIS — Z7982 Long term (current) use of aspirin: Secondary | ICD-10-CM | POA: Insufficient documentation

## 2020-04-10 DIAGNOSIS — R0602 Shortness of breath: Secondary | ICD-10-CM | POA: Diagnosis present

## 2020-04-10 DIAGNOSIS — I110 Hypertensive heart disease with heart failure: Secondary | ICD-10-CM | POA: Diagnosis not present

## 2020-04-10 DIAGNOSIS — Z87891 Personal history of nicotine dependence: Secondary | ICD-10-CM | POA: Insufficient documentation

## 2020-04-10 DIAGNOSIS — J189 Pneumonia, unspecified organism: Secondary | ICD-10-CM | POA: Insufficient documentation

## 2020-04-10 DIAGNOSIS — J441 Chronic obstructive pulmonary disease with (acute) exacerbation: Secondary | ICD-10-CM | POA: Diagnosis not present

## 2020-04-10 DIAGNOSIS — Z79899 Other long term (current) drug therapy: Secondary | ICD-10-CM | POA: Insufficient documentation

## 2020-04-10 DIAGNOSIS — I5022 Chronic systolic (congestive) heart failure: Secondary | ICD-10-CM | POA: Diagnosis not present

## 2020-04-10 DIAGNOSIS — I2510 Atherosclerotic heart disease of native coronary artery without angina pectoris: Secondary | ICD-10-CM | POA: Insufficient documentation

## 2020-04-10 DIAGNOSIS — J449 Chronic obstructive pulmonary disease, unspecified: Secondary | ICD-10-CM | POA: Insufficient documentation

## 2020-04-10 LAB — BLOOD GAS, VENOUS
Acid-Base Excess: 3.9 mmol/L — ABNORMAL HIGH (ref 0.0–2.0)
Bicarbonate: 31.3 mmol/L — ABNORMAL HIGH (ref 20.0–28.0)
O2 Saturation: 92.4 %
Patient temperature: 37
pCO2, Ven: 58 mmHg (ref 44.0–60.0)
pH, Ven: 7.34 (ref 7.250–7.430)
pO2, Ven: 69 mmHg — ABNORMAL HIGH (ref 32.0–45.0)

## 2020-04-10 LAB — CBC WITH DIFFERENTIAL/PLATELET
Abs Immature Granulocytes: 0.04 10*3/uL (ref 0.00–0.07)
Basophils Absolute: 0.1 10*3/uL (ref 0.0–0.1)
Basophils Relative: 1 %
Eosinophils Absolute: 0.4 10*3/uL (ref 0.0–0.5)
Eosinophils Relative: 6 %
HCT: 42.8 % (ref 39.0–52.0)
Hemoglobin: 13.5 g/dL (ref 13.0–17.0)
Immature Granulocytes: 1 %
Lymphocytes Relative: 23 %
Lymphs Abs: 1.8 10*3/uL (ref 0.7–4.0)
MCH: 29 pg (ref 26.0–34.0)
MCHC: 31.5 g/dL (ref 30.0–36.0)
MCV: 91.8 fL (ref 80.0–100.0)
Monocytes Absolute: 0.8 10*3/uL (ref 0.1–1.0)
Monocytes Relative: 10 %
Neutro Abs: 4.7 10*3/uL (ref 1.7–7.7)
Neutrophils Relative %: 59 %
Platelets: 248 10*3/uL (ref 150–400)
RBC: 4.66 MIL/uL (ref 4.22–5.81)
RDW: 12.5 % (ref 11.5–15.5)
WBC: 7.8 10*3/uL (ref 4.0–10.5)
nRBC: 0 % (ref 0.0–0.2)

## 2020-04-10 LAB — BASIC METABOLIC PANEL
Anion gap: 10 (ref 5–15)
BUN: 15 mg/dL (ref 8–23)
CO2: 32 mmol/L (ref 22–32)
Calcium: 9.8 mg/dL (ref 8.9–10.3)
Chloride: 101 mmol/L (ref 98–111)
Creatinine, Ser: 1.05 mg/dL (ref 0.61–1.24)
GFR calc Af Amer: >60 mL/min (ref >60)
GFR calc non Af Amer: >60 mL/min (ref >60)
Glucose, Bld: 108 mg/dL — ABNORMAL HIGH (ref 70–99)
Potassium: 4 mmol/L (ref 3.5–5.1)
Sodium: 143 mmol/L (ref 135–145)

## 2020-04-10 LAB — TROPONIN I (HIGH SENSITIVITY)
Troponin I (High Sensitivity): 9 ng/L (ref <18)
Troponin I (High Sensitivity): 7 ng/L (ref <18)

## 2020-04-10 IMAGING — DX DG CHEST 1V PORT
1 series · 1 of 1 positions shown · non-contrast
Comparison: [DATE].  CT [DATE].

CLINICAL DATA: Shortness of breath.

EXAM:
PORTABLE CHEST 1 VIEW

[chest ap]
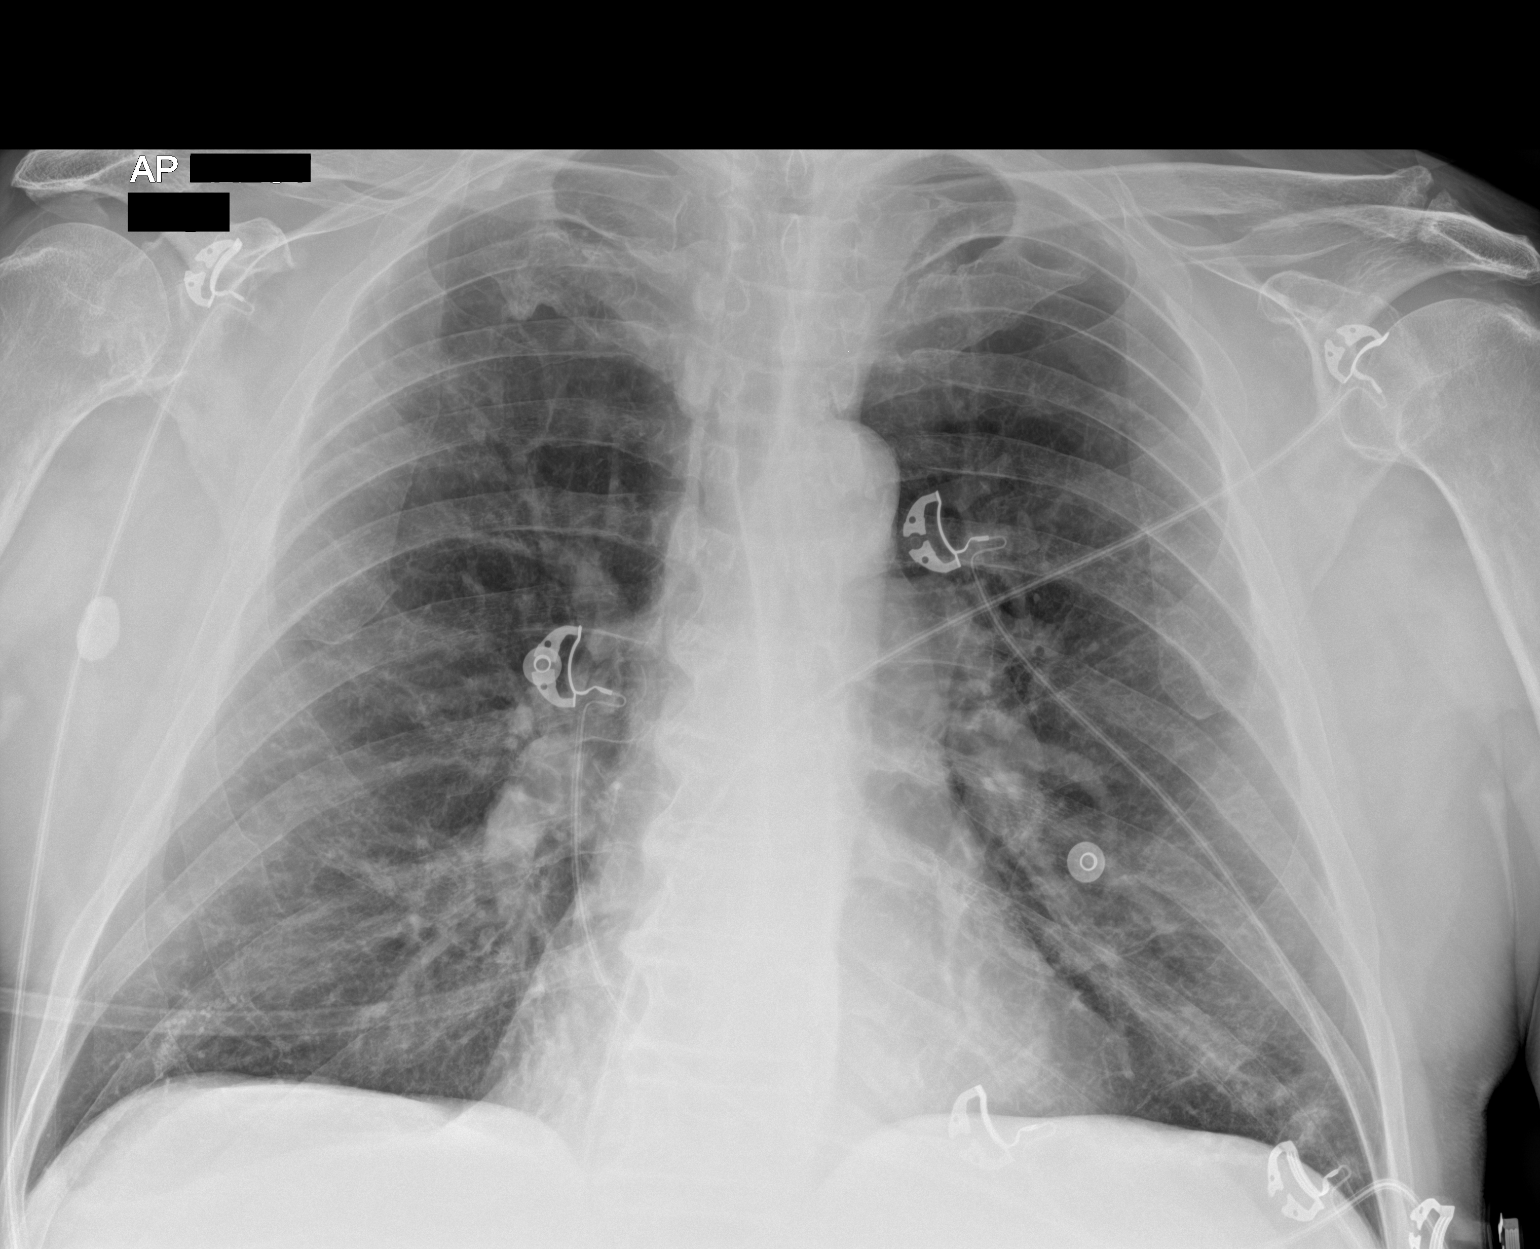

[1 of 1 positions shown; findings below may reference images not displayed]

FINDINGS: Mediastinum and hilar structures normal. Heart size normal. Mild
bibasilar subsegmental atelectasis/infiltrates. Tiny new right upper
lobe pulmonary nodule cannot be excluded. Previously identified
pulmonary nodule left upper lobe is well less well identified on
today's exam. No pleural effusion or pneumothorax. Degenerative
change thoracic spine.
IMPRESSION: 1.  Mild bibasilar subsegmental atelectasis/infiltrate.

2. Tiny new right upper lobe pulmonary nodule cannot be excluded.
Previously identified pulmonary nodule left upper lobe is less well
identified on today's exam.

## 2020-04-10 MED ORDER — DOXYCYCLINE HYCLATE 100 MG PO TABS
100.0000 mg | ORAL_TABLET | Freq: Once | ORAL | Status: AC
  Administered 2020-04-10: 100 mg via ORAL
  Filled 2020-04-10: qty 1

## 2020-04-10 MED ORDER — DOXYCYCLINE HYCLATE 100 MG PO CAPS: 100.0000 mg | ORAL_CAPSULE | Freq: Two times a day (BID) | ORAL | 0 refills | Status: AC

## 2020-04-10 MED ORDER — METHYLPREDNISOLONE SODIUM SUCC 125 MG IJ SOLR
125.0000 mg | Freq: Once | INTRAMUSCULAR | Status: AC
  Administered 2020-04-10: 125 mg via INTRAVENOUS
  Filled 2020-04-10: qty 2

## 2020-04-10 MED ORDER — SODIUM CHLORIDE 0.9 % IV SOLN
1.0000 g | Freq: Once | INTRAVENOUS | Status: AC
  Administered 2020-04-10: 1 g via INTRAVENOUS
  Filled 2020-04-10: qty 10

## 2020-04-10 MED ORDER — AMOXICILLIN-POT CLAVULANATE 875-125 MG PO TABS: 1.0000 | ORAL_TABLET | Freq: Two times a day (BID) | ORAL | 0 refills | Status: AC

## 2020-04-10 MED ORDER — PREDNISONE 20 MG PO TABS: 60.0000 mg | ORAL_TABLET | Freq: Every day | ORAL | 0 refills | Status: AC

## 2020-04-10 NOTE — ED Triage Notes (Signed)
Pt aarives [facility] from home w cc of shob. Pt reports 2L [place] chronically, took duoneb at home before calling 911 with no relief. Pt arrives on 4L mask w humidity. Has wheezing and tight lung sounds. Pt reports some L sided CP that comes and goes for the last 2 weeks. Pt reports being here last week and just finished 2 rounds of steroids

## 2020-04-10 NOTE — ED Provider Notes (Signed)
[facility] Emergency Department Provider Note  ____________________________________________  Time seen: Approximately 7:22 AM  I have reviewed the triage vital signs and the nursing notes.   HISTORY  Chief Complaint Shortness of Breath   HPI Leslie Abron Sims is a 69 y.o. male the history of chronic respiratory failure from COPD on 2 L nasal cannula, CAD status post stents, heart failure with a EF of 40 to 45%, hypertension, hyperlipidemia who presents for chest pain and shortness of breath.  Patient symptoms have been ongoing for 3 weeks.  He has had intermittent tightness in his chest and progressively worsening shortness of breath.  This evening the shortness of breath became more severe and unresponsive to his inhaler.  No fever or chills.  No changes in his chronic cough.  No loss of taste or smell.  No vomiting or diarrhea.  No personal or family history of blood clots, no recent travel immobilization, no leg pain or swelling, no hemoptysis or exogenous hormones.  He has an appointment coming up with pulmonologist to establish care for worsening COPD.   Per EMS, patient was satting in the mid 90s when they arrived on his 2 L nasal cannula.  He received 1 DuoNeb in route.  Patient reports feeling improved upon arrival to the emergency room.  Past Medical History:  Diagnosis Date  . Allergic rhinitis   . Anxiety   . Back pain   . Colon polyps 08/2012   colonoscopy; multiple colon polyps; repeat colonoscopy in one year.  [name] COPD (chronic obstructive pulmonary disease) ([place])    a. on home O2.  . Coronary artery disease 11/2009   a. late presenting ant MI; b. LHC 100% pLAD s/p PCI/DES, 99% mRCA s/p PCI/DES, EF 35%; c. nuclear stress test 05/13: prior ant/inf infarcts w/o ischemia, EF 43%; d. LHC 02/14: widely patent stents with no other obs dz, EF 40%; e. LHC 11/17: LM nl, mLAD 10%, patent LAD stent, dLAD 20%, p-mRCA 10%, mRCA 40%, patent RCA stent, EF 35-45%    . Depression   . GERD (gastroesophageal reflux disease)   . Headache(784.0)   . Helicobacter pylori (H. pylori)   . Hemorrhoids   . Hernia    inguinal  . HFrEF (heart failure with reduced ejection fraction) ([place])    a. 10/2016 LV gram: EF 35-45%; b. 06/2018 Echo: EF 40-45%.  . Hyperlipidemia   . Hypertension   . Insomnia   . Ischemic cardiomyopathy    a. 10/2016 LV gram: EF 35-45%; b. 06/2018 Echo: EF 40-45%.  . Myalgia   . Peptic ulcer   . Pulmonary nodules   . ST elevation (STEMI) myocardial infarction involving left anterior descending coronary artery ([place]) 11/2009   a. s/p PCI to the LAD  . Status post dilation of esophageal narrowing 2000  . Tinea pedis   . Wears dentures    partial upper    Patient Active Problem List   Diagnosis Date Noted  . Goals of care, counseling/discussion   . Palliative care by specialist   . Healthcare-associated pneumonia 01/28/2019  . COPD with acute exacerbation ([place]) 12/31/2018  . Acute respiratory failure with hypoxia ([place]) 09/29/2018  . Acute respiratory failure with hypoxia and hypercapnia ([place]) 09/13/2018  . Acute on chronic respiratory failure with hypoxia and hypercapnia ([place]) 08/24/2018  . Chest pain 07/02/2018  . Grief reaction 01/13/2018  . COPD with hypoxia ([place]) 12/25/2017  . Unstable angina ([place])   . Benign neoplasm of ascending colon   .  Benign neoplasm of descending colon   . Benign neoplasm of sigmoid colon   . History of colonic polyps   . Pulmonary nodules/lesions, multiple 01/05/2015  . Bruit of left carotid artery 11/04/2014  . Sleep disorder 07/18/2013  . Seasonal and perennial allergic rhinitis 05/29/2013  . Bradycardia 04/20/2011  . SMOKER 07/30/2010  . CAD, NATIVE VESSEL 04/23/2010  . Hyperlipidemia 03/24/2010  . DEPRESSION/ANXIETY 03/24/2010  . Chronic systolic heart failure ([place]) 03/24/2010  . COPD, very severe ([place]) 03/24/2010  . GERD 03/24/2010  . Essential hypertension 11/21/2009    Past Surgical  History:  Procedure Laterality Date  . Admission  12/20/2012   COPD exacerbation.  [place].  [name] CARDIAC CATHETERIZATION    . CARDIAC CATHETERIZATION  02/05/13   [facility]  . CARDIAC CATHETERIZATION  10/14   [facility] : patent stents with no change in anatomy. EF: 40$  . CARDIAC CATHETERIZATION Left 11/12/2016   Procedure: Left Heart Cath and Coronary Angiography;  Surgeon: Wellington Hampshire, MD;  Location: [place] CV LAB;  Service: Cardiovascular;  Laterality: Left;  . COLONOSCOPY    . COLONOSCOPY WITH PROPOFOL N/A 05/18/2016   Procedure: COLONOSCOPY WITH PROPOFOL;  Surgeon: Lucilla Lame, MD;  Location: [facility] ENDOSCOPY;  Service: Endoscopy;  Laterality: N/A;  . COLONOSCOPY WITH PROPOFOL N/A 03/19/2020   Procedure: COLONOSCOPY WITH PROPOFOL;  Surgeon: Lin Landsman, MD;  Location: [facility] ENDOSCOPY;  Service: Gastroenterology;  Laterality: N/A;  . CORONARY ANGIOPLASTY WITH STENT PLACEMENT  09/2009   LAD 3.0 X23 mm Xience DES, RCA: 4.0 X 15 mm Xience DES  . ELECTROMAGNETIC NAVIGATION BROCHOSCOPY N/A 10/27/2017   Procedure: ELECTROMAGNETIC NAVIGATION BRONCHOSCOPY;  Surgeon: Flora Lipps, MD;  Location: [facility] ORS;  Service: Cardiopulmonary;  Laterality: N/A;  . ESOPHAGEAL DILATION    . ESOPHAGOGASTRODUODENOSCOPY  2008  . ESOPHAGOGASTRODUODENOSCOPY  08/20/2012  . HERNIA REPAIR  07/20/2012   L inguinal hernia repair  . PENILE PROSTHESIS IMPLANT      Prior to Admission medications   Medication Sig Start Date End Date Taking? Authorizing Provider  albuterol (PROVENTIL HFA;VENTOLIN HFA) 108 (90 Base) MCG/ACT inhaler Inhale 2 puffs into the lungs every 6 (six) hours as needed for wheezing or shortness of breath. 11/01/18   Rudene Re, MD  amoxicillin-clavulanate (AUGMENTIN) 875-125 MG tablet Take 1 tablet by mouth 2 (two) times daily for 7 days. 04/10/20 04/17/20  Rudene Re, MD  aspirin 81 MG tablet Take 81 mg by mouth daily.      [provider]  atorvastatin (LIPITOR) 40 MG tablet Take  1 tablet (40 mg total) by mouth daily. 03/03/18   [name], MD  doxycycline (VIBRAMYCIN) 100 MG capsule Take 1 capsule (100 mg total) by mouth 2 (two) times daily for 7 days. 04/10/20 04/17/20  Rudene Re, MD  Fluticasone-Salmeterol (ADVAIR) 500-50 MCG/DOSE AEPB Inhale 1 puff into the lungs every 12 (twelve) hours. 11/29/18   [provider]  furosemide (LASIX) 20 MG tablet Take 1 tablet (20 mg total) by mouth daily. 07/07/18 12/04/19  [name], MD  furosemide (LASIX) 20 MG tablet Take 20 mg by mouth.    [provider]  guaiFENesin-dextromethorphan (ROBITUSSIN DM) 100-10 MG/5ML syrup Take 5 mLs by mouth every 4 (four) hours as needed for cough. Patient not taking: Reported on 03/19/2020 01/31/19   [name], MD  hydrOXYzine (VISTARIL) 25 MG capsule Take 25 mg by mouth 2 (two) times daily as needed for anxiety or itching.  06/05/18   [provider]  ipratropium-albuterol (  DUONEB) 0.5-2.5 (3) MG/3ML SOLN USE 1 VIAL VIA NEBULIZER AND INHALE BY MOUTH 4 TIMES A DAY 01/21/20   [name], MD  LORazepam (ATIVAN) 1 MG tablet Take 1 tablet (1 mg total) by mouth every 4 (four) hours while awake. 02/14/19   [name], MD  losartan (COZAAR) 25 MG tablet Take 25 mg by mouth daily. 02/23/19   [provider]  mirtazapine (REMERON) 30 MG tablet Take 1 tablet (30 mg total) by mouth at bedtime. 06/24/18   [name], MD  Multiple Vitamin (MULTIVITAMIN) capsule Take 1 capsule by mouth daily.    [provider]  nitroGLYCERIN (NITROSTAT) 0.4 MG SL tablet Place 1 tablet (0.4 mg total) under the tongue every 5 (five) minutes as needed. 10/17/17   Wellington Hampshire, MD  OLANZapine (ZYPREXA) 2.5 MG tablet Take 2.5 mg by mouth at bedtime. 01/19/19   [provider]  OXYGEN Inhale 3 L into the lungs daily.     [provider]  predniSONE (DELTASONE) 20 MG tablet Take 3 tablets (60 mg total) by mouth daily for 4 days. 04/10/20 04/14/20   Rudene Re, MD    Allergies Prozac [fluoxetine hcl], Effexor xr [venlafaxine hcl er], and Wellbutrin [bupropion]  Family History  Problem Relation Age of Onset  . Heart attack Brother        Brother #1  . Diabetes Brother   . Hypertension Brother        #3  . Coronary artery disease Father 74       deceased  . Heart attack Father   . Diabetes Father   . Heart disease Father   . COPD Mother 58       deceased  . Alcohol abuse Sister        polysubstance abuse  . COPD Sister   . Lung cancer Sister   . Alcohol abuse Sister        polysubstance abuse  . Penile cancer Brother   . Diabetes Brother     Social History Social History   Tobacco Use  . Smoking status: Former Smoker    Packs/day: 1.00    Years: 42.00    Pack years: 42.00    Types: Cigarettes    Quit date: 02/10/2019    Years since quitting: 1.1  . Smokeless tobacco: Never Used  Substance Use Topics  . Alcohol use: No    Alcohol/week: 0.0 standard drinks  . Drug use: No    Review of Systems  Constitutional: Negative for fever. Eyes: Negative for visual changes. ENT: Negative for sore throat. Neck: No neck pain  Cardiovascular: + chest pain. Respiratory: +shortness of breath. Gastrointestinal: Negative for abdominal pain, vomiting or diarrhea. Genitourinary: Negative for dysuria. Musculoskeletal: Negative for back pain. Skin: Negative for rash. Neurological: Negative for headaches, weakness or numbness. Psych: No SI or HI  ____________________________________________   PHYSICAL EXAM:  VITAL SIGNS: ED Triage Vitals  Enc Vitals Group     BP 04/10/20 0507 138/78     Pulse Rate 04/10/20 0507 69     Resp 04/10/20 0507 16     Temp 04/10/20 0507 98.4 F (36.9 C)     Temp Source 04/10/20 0507 Oral     SpO2 04/10/20 0501 100 %     Weight 04/10/20 0504 182 lb 15.7 oz (83 kg)     Height 04/10/20 0504 5\' 10"  (1.778 m)     Head Circumference --      Peak Flow --  Pain Score 04/10/20  0502 2     Pain Loc --      Pain Edu? --      Excl. in [place]? --     Constitutional: Alert and oriented. Well appearing and in no apparent distress. HEENT:      Head: Normocephalic and atraumatic.         Eyes: Conjunctivae are normal. Sclera is non-icteric.       Mouth/Throat: Mucous membranes are moist.       Neck: Supple with no signs of meningismus. Cardiovascular: Regular rate and rhythm. No murmurs, gallops, or rubs.  Respiratory: Normal work of breathing, normal sats on baseline 2 L, decreased air movement bilaterally with faint expiratory wheezes.  Gastrointestinal: Soft, non tender, and non distended with positive bowel sounds. No rebound or guarding. Musculoskeletal: No edema, cyanosis, or erythema of extremities. Neurologic: Normal speech and language. Face is symmetric. Moving all extremities. No gross focal neurologic deficits are appreciated. Skin: Skin is warm, dry and intact. No rash noted. Psychiatric: Mood and affect are normal. Speech and behavior are normal.  ____________________________________________   LABS (all labs ordered are listed, but only abnormal results are displayed)  Labs Reviewed  BLOOD GAS, VENOUS - Abnormal; Notable for the following components:      Result Value   pO2, Ven 69.0 (*)    Bicarbonate 31.3 (*)    Acid-Base Excess 3.9 (*)    All other components within normal limits  BASIC METABOLIC PANEL - Abnormal; Notable for the following components:   Glucose, Bld 108 (*)    All other components within normal limits  CBC WITH DIFFERENTIAL/PLATELET  TROPONIN I (HIGH SENSITIVITY)  TROPONIN I (HIGH SENSITIVITY)   ____________________________________________  EKG  ED ECG REPORT I, Rudene Re, the attending physician, personally viewed and interpreted this ECG.   Normal sinus rhythm, rate of 73, normal intervals, left axis deviation, no ST elevations or depressions.  Unchanged from prior. ____________________________________________   RADIOLOGY  I have personally reviewed the images performed during this visit and I agree with the Radiologist's read.   Interpretation by Radiologist:  DG Chest Portable 1 View  Result Date: 04/10/2020 CLINICAL DATA:  Shortness of breath. EXAM: PORTABLE CHEST 1 VIEW COMPARISON:  04/02/2020.  CT 11/02/2019. FINDINGS: Mediastinum and hilar structures normal. Heart size normal. Mild bibasilar subsegmental atelectasis/infiltrates. Tiny new right upper lobe pulmonary nodule cannot be excluded. Previously identified pulmonary nodule left upper lobe is well less well identified on today's exam. No pleural effusion or pneumothorax. Degenerative change thoracic spine. IMPRESSION: 1.  Mild bibasilar subsegmental atelectasis/infiltrate. 2. Tiny new right upper lobe pulmonary nodule cannot be excluded. Previously identified pulmonary nodule left upper lobe is less well identified on today's exam. Electronically Signed   By: [place]   On: 04/10/2020 05:29     ____________________________________________   PROCEDURES  Procedure(s) performed:yes .1-3 Lead EKG Interpretation Performed by: Rudene Re, MD Authorized by: Rudene Re, MD     Interpretation: normal     ECG rate:  80   ECG rate assessment: normal     Rhythm: sinus rhythm     Ectopy: none     Conduction: normal     Critical Care performed: none ____________________________________________   INITIAL IMPRESSION / ASSESSMENT AND PLAN / ED COURSE   69 y.o. male the history of chronic respiratory failure from COPD on 2 L nasal cannula, CAD status post stents, heart failure with a EF of 40 to 45%, hypertension, hyperlipidemia who presents  for chest pain and shortness of breath.  Patient with significant wheezing per EMS but not hypoxic.  Arrives with a DuoNeb currently being administered. Solumedrol given on arrival. He is tight on exam but the wheezing has resolved.  He is satting well on his baseline oxygen.  He  looks euvolemic.  Afebrile with normal vital signs otherwise.  Differential diagnoses including COPD exacerbation versus bronchitis versus pneumonia versus CHF exacerbation.  Low suspicion for Covid with no fever, ongoing symptoms for several weeks and a negative Covid 3 weeks ago.  Chest x-ray visualized by me and read as possible early pneumonia, confirmed by radiology.  No signs of sepsis or acute respiratory failure.  VBG showing pH of 7.34 with normal CO2 of 58 with no evidence of retention.  Old medical records have been reviewed.  Since patient was complained of chest pain a second troponin is pending.  Low suspicion for ACS with resolution of his chest pain after breathing treatments.  Patient was given IV Rocephin and doxycycline.  Will discharge home on Augmentin and doxycycline.  We will also put patient on prednisone.  Discussed my standard return precautions and follow-up with PCP.  Care transferred to Dr. Ellender Hose.      _____________________________________________ Please note:  Patient was evaluated in Emergency Department today for the symptoms described in the history of present illness. Patient was evaluated in the context of the global COVID-19 pandemic, which necessitated consideration that the patient might be at risk for infection with the SARS-CoV-2 virus that causes COVID-19. Institutional protocols and algorithms that pertain to the evaluation of patients at risk for COVID-19 are in a state of rapid change based on information released by regulatory bodies including the CDC and federal and state organizations. These policies and algorithms were followed during the patient's care in the ED.  Some ED evaluations and interventions may be delayed as a result of limited staffing during the pandemic.   [place] Controlled Substance Database was reviewed by me. ____________________________________________   FINAL CLINICAL IMPRESSION(S) / ED DIAGNOSES   Final diagnoses:  COPD  exacerbation ([place])  Community acquired pneumonia, unspecified laterality      NEW MEDICATIONS STARTED DURING THIS VISIT:  ED Discharge Orders         Ordered    amoxicillin-clavulanate (AUGMENTIN) 875-125 MG tablet  2 times daily     04/10/20 0722    doxycycline (VIBRAMYCIN) 100 MG capsule  2 times daily     04/10/20 0722    predniSONE (DELTASONE) 20 MG tablet  Daily     04/10/20 [id number]           Note:  This document was prepared using Dragon voice recognition software and may include unintentional dictation errors.    Alfred Levins, [place], MD 04/10/20 [phone]

## 2020-04-10 NOTE — ED Notes (Signed)
Pt bumped down to @L  [place] from 4L. Will continue to monitor.

## 2020-04-21 ENCOUNTER — Ambulatory Visit (INDEPENDENT_AMBULATORY_CARE_PROVIDER_SITE_OTHER): Payer: Medicare Other | Admitting: Pulmonary Disease

## 2020-04-21 ENCOUNTER — Other Ambulatory Visit: Payer: Self-pay

## 2020-04-21 ENCOUNTER — Encounter: Payer: Self-pay | Admitting: Pulmonary Disease

## 2020-04-21 VITALS — BP 118/62 | HR 94 | Temp 97.7°F | Ht 70.0 in | Wt 183.4 lb

## 2020-04-21 DIAGNOSIS — R918 Other nonspecific abnormal finding of lung field: Secondary | ICD-10-CM | POA: Diagnosis not present

## 2020-04-21 DIAGNOSIS — Z87891 Personal history of nicotine dependence: Secondary | ICD-10-CM | POA: Diagnosis not present

## 2020-04-21 DIAGNOSIS — J9611 Chronic respiratory failure with hypoxia: Secondary | ICD-10-CM | POA: Diagnosis not present

## 2020-04-21 DIAGNOSIS — J449 Chronic obstructive pulmonary disease, unspecified: Secondary | ICD-10-CM | POA: Diagnosis not present

## 2020-04-21 NOTE — Patient Instructions (Signed)
Given you the contact number for the AIRFLOW 3 trial at [facility], call at your convenience to see if you qualify, I think this may be of help to you.  We will see you in follow-up in 2 months time.   We will continue medications as they are currently.  Your oxygen flow can be decreased to 2 L/min as per the test that we did today in the office.

## 2020-04-21 NOTE — Progress Notes (Signed)
Subjective:   Patient ID: Leslie Sims, male    DOB: [birth year], 69 y.o.   MRN: [id number] Requesting MD/Service:Kristi Tamala Julian, MD Date of initial consultation:08/10/16 by Dr. [name] Reason for consultation:COPD, smoker  PT PROFILE: 70 M former smoker quit 02/10/2019 with severe emphysema seen by multiple pulmonologists in past ([place], [place], [place], [place]) referred for further eval and mgmt of COPD.  Previously seen by Dr. [name] I have assumed care after Dr. Lonell Grandchild departure.  DATA: 07/24/12 Office Spirometry: Severe obstruction (FEV1 31% pred) 08/31/13 Office Spirometry: very severe obstruction (FEV1 25% pred) 06/18/15 CT chest:Severe emphysema 03/03/17 Overnight oximetry: Lowest SpO2 72%. 95% of time SpO2 was below 90% 03/03/17 6MWT: 384 meters. Desat to 84% 10/04/17 CT chest: Two new irregularly marginated nodules, one in the RUL, and a second in the mid LUL worrisome for either synchronous lung ca or metastatic involvement of the lungs. The two small nodules described on the prior dictated report have resolved in the left superior segment and most likely were postinflammatory. Diffuse centrilobular emphysema. 10/13/17 PET scan: The new left upper lobe nodule is FDG avid consistent with malignancy. Recommend tissue confirmation. The new nodule in the right upper lobe demonstrates low level uptake. While the level of uptake suggests the possibility of an inflammatory or infectious process, a low-grade malignancy is not excluded. Given the suspected malignancy on the left, tissue confirmation should be considered on the right despite the low level of uptake. No distant metastatic disease identified 10/27/17 ENB ([place]):Transbronchial Fine Needle Aspirations 21G X7, Transbronchial Forceps Biopsy X6. ALVEOLATED LUNG TISSUE WITH HEMOSIDERIN LADEN MACROPHAGES. NEGATIVE FOR ATYPIA AND MALIGNANCY 11/01/17 PFTs:FVC: 2.96 >3.14 L (68 >72 %pred), FEV1: 0.94 >0.93 L (27 %pred),  FEV1/FVC: 32%, TLC: 9.04 L (137 %pred), DLCO 62 %pred  12/07/17 CTA chest: No pulmonary embolus identified. Anterior left upper lobe ground-glass and nodular consolidation likely representing pneumonitis. Stable bilateral upper lobe pulmonary nodules. Stable emphysema. 01/21/18 CT chest: new masslike architectural distortion and ground-glass attenuation surrounding the index nodule within the RUL. The appearance favors an inflammatory or infectious process. The index nodule in the left upper lobe is slightly decreased in volume when compared with the previous exam favoring a benign process. Attention on follow-up imaging advise. Advanced changes of emphysema (very severe) 06/01/18 CTA chest:No evidence of pulmonary embolism. Waxing and waning bilateral pulmonary nodular process as described. New 8 mm nodular ill-defined focus of opacification over the lingula which may represent an acute inflammatory or infectious process. No effusion. Emphysema  10/19/18 CTA chest:No acute cardiopulmonary disease and no evidence of pulm embolism. Hospitalization 1/12-1/14/2020: COPD exacerbation Hospitalization 2/22-2/27/20: COPD exacerbation, HCAP 02/11/19 CT chest:Progressed masslike consolidation within the RUL in the region of previous waxing and waning nodule, this is contiguous with the pleural surface and demonstrates spiculated margin and probable small focus of central cavitation. Findings could be secondary to infection superimposed on chronic nodule. However cavitary neoplasm is also a concern. Multiple additional lung nodules which have not significantly changed. Small foci of ground-glass density in the RUL may reflect small focus of infection. Mod-severe emphysema 05/01/19 CT chest:Waxing/waning right lung opacities, suggesting infection, including atypical/mycobacterial infection. Improvement of the prior dominant masslike opacity argues against neoplasm. Stable left upper lobe nodule, likely benign.  Aortic Atherosclerosis and Emphysema 11/02/2019 CT chest: Multiple larger pulmonary nodules and masslike areas seen on the prior study have largely regressed.  Findings indicative of infectious/inflammatory process.  Diffuse bronchial wall thickening with moderate centrilobular and paraseptal emphysema.  Suggestion of left  main and three-vessel coronary artery disease.  INTERVAL: Last seen by  me on 10/03/2019. 2 visits to ED due to COPD exacerbation.  04/02/2020 and 04/10/2020.  HPI Is 69 year old former smoker (quit 01/2019) who follows here for very severe COPD and chronic respiratory failure with hypoxemia.  He has had 2 visits to the emergency room at [facility] as noted above.  He noted increasing dyspnea with seasonal change.  Recall that our prior visit we had recommended he use DuoNeb 4 times a day.  He did not feel that the dry powder inhalers were helping him as he did not feel he could get them in well enough.  This is not unusual for COPD years with advanced disease due to inability to breath-hold.  He was instructed however that if he noticed any change in his breathing to resume Advair but not to resume Spiriva as it would be a duplication of medication with DuoNeb.  Patient required a prednisone taper and antibiotic.  He required repeat prednisone and antibiotic on the second ER visit.  Since then he has resumed his Advair 500/50 twice a day.  He continues to be on DuoNeb.  He has been requiring oxygen at 4 L/min since his exacerbation.  Today he presents stating that he feels better.  He feels that his dyspnea is at baseline which is usually moderate to severe.  He had been having issues with gastroesophageal reflux and has been recently placed on Carafate by GI he feels that this is helping his reflux.  Prior PFTs show severe obstructive defect with no bronchodilator response.  Since his last visit to the ED he has not had any fevers, chills or sweats.  No change in the character of his sputum.  No  change in the character of his cough.  No hemoptysis.  No orthopnea paroxysmal nocturnal dyspnea or chest pain.  No lower extremity edema and no calf tenderness.  Patient also has issues with evanescent long nodules likely related to inflammatory/infectious process.  Most recent CT scan showed resolution of most of these nodules.  The patient was initially evaluated by me in October 2020.  He was never able to get his pulmonary function testing done due to the COVID-19 pandemic.  He will need reassessment of his pulmonary function.  We also discussed the advanced nature of his disease and that frequent exacerbations are going to be an issue despite maximal therapy.  We discussed the AIRFLOW 3 trial currently being conducted at [facility] and at [facility].  I have provided him information with regards to a trial.  This is evaluation for Targeted Lung Denervation (TLD) to prevent exacerbations.   Review of Systems A 10 point review of systems was performed and it is as noted above otherwise negative.    Objective:   Physical Exam BP 118/62 (BP Location: Left Arm, Cuff Size: Normal)   Pulse 94   Temp 97.7 F (36.5 C) (Temporal)   Ht 5\' 10"  (1.778 m)   Wt 183 lb 6.4 oz (83.2 kg)   SpO2 98%   BMI 26.32 kg/m  GENERAL: Well-developed well-nourished gentleman, chronic use of accessories, no conversational dyspnea.  No acute distress.  Presents in transport chair.  Using oxygen portable concentrator at 4 L pulsed. HEAD: Normocephalic, atraumatic.  EYES: Pupils equal, round, reactive to light.  No scleral icterus.  MOUTH: Nose/mouth/throat not examined due to masking requirements for COVID 19. NECK: Supple. No thyromegaly. No nodules. No JVD.  Trachea midline, no crepitus. PULMONARY:  Very distant breath sounds, no adventitious sounds. CARDIOVASCULAR: S1 and S2. Regular rate and rhythm.  No rubs murmurs or gallops heard. GASTROINTESTINAL: Slightly protuberant abdomen, soft, benign. MUSCULOSKELETAL:  No joint deformity, no clubbing, no edema.  NEUROLOGIC: Awake, alert, oriented.  No overt focal deficits.  Speech. SKIN: Intact,warm,dry.  Limited exam: No rashes. PSYCH: Normal mood and behavior.   Ambulatory oximetry: On room air the patient walked an average pace and saturations dropped very quickly to 88%. Placed on 2 L pulsed O2 and was able to maintain saturations at 92% with ambulation.   Representative slice of most recent CT scan shows resolution of previously noted nodules and no change on the left lung nodule.      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 Recent exacerbation with 2 ED visits Continue DuoNebs Continue Advair 500/50 Reassess lung function at PFTs Given information and referred to AIRFLOW 3 trial ([facility]) for consideration of TLD Follow-up in 2 months time, call sooner should any new problems arise  Chronic hypoxemic respiratory failure Ambulatory oximetry performed today Patient may decrease his oxygen supplementation to 2 L/min, pulsed  Bilateral pulmonary opacities/evanescent nodules Likely due to inflammatory/infectious process Negative biopsy previously by ENB Continue to monitor expectantly  Former smoker Commended on ongoing abstinence from cigarettes  C. Derrill Kay, MD [facility]   *This note was dictated using voice recognition software/Dragon.  Despite best efforts to proofread, errors can occur which can change the meaning.  Any change was purely unintentional.

## 2020-04-22 ENCOUNTER — Encounter: Payer: Self-pay | Admitting: Pulmonary Disease

## 2020-04-23 ENCOUNTER — Ambulatory Visit
Admission: RE | Admit: 2020-04-23 | Discharge: 2020-04-23 | Disposition: A | Discharge status: Home / Self Care | Payer: Medicare Other | Source: Ambulatory Visit | Attending: Hematology and Oncology | Admitting: Hematology and Oncology

## 2020-04-23 ENCOUNTER — Other Ambulatory Visit: Payer: Self-pay

## 2020-04-23 DIAGNOSIS — R918 Other nonspecific abnormal finding of lung field: Secondary | ICD-10-CM | POA: Insufficient documentation

## 2020-04-23 IMAGING — CT CT CHEST W/O CM
1 series · 15 of 34 positions shown, 19 images · non-contrast
Comparison: [DATE], [DATE], [DATE], [DATE]

CLINICAL DATA: Follow-up pulmonary nodules.

EXAM:
CT CHEST WITHOUT CONTRAST
TECHNIQUE: Multidetector CT imaging of the chest was performed following the
standard protocol without IV contrast.

[Series 2: thorax · axial · 0.80mm/px · z∈[-712,-422]mm · 15 of 171 slices shown, 19 images]
[im 13/171  mediastinal]
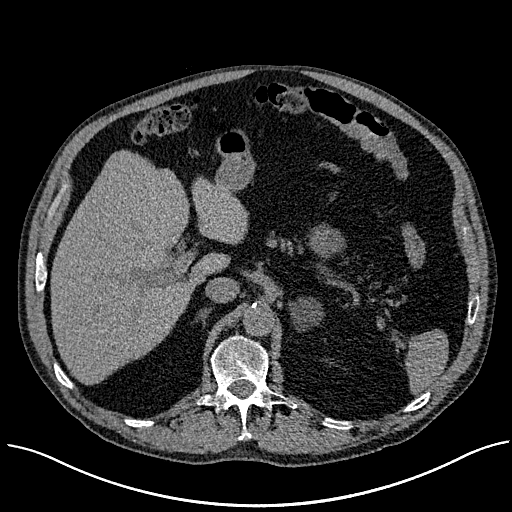
[im 13/171  lung]
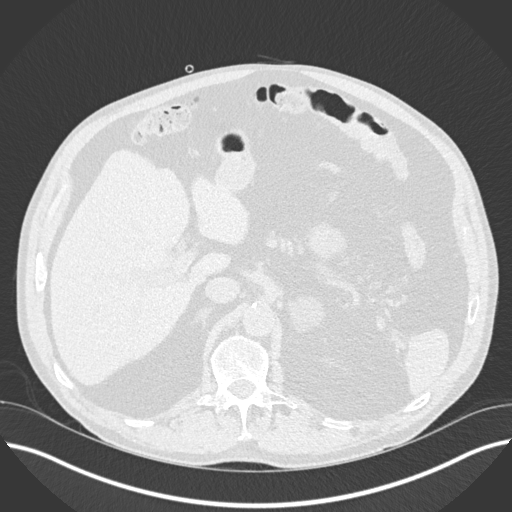
[im 26/171  lung]
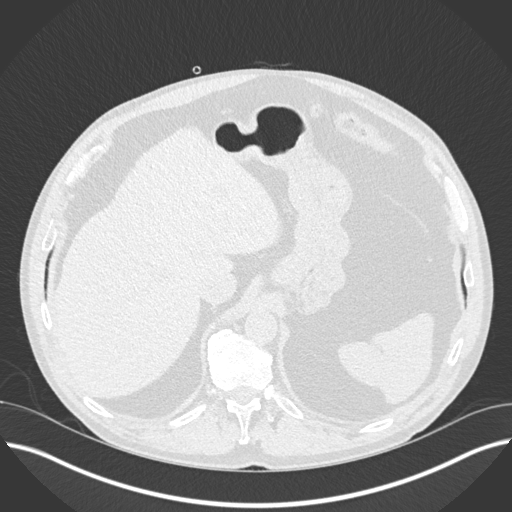
[im 35/171  lung]
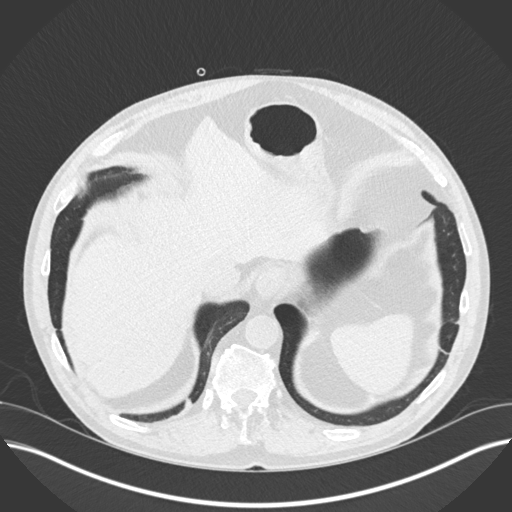
[im 45/171  lung]
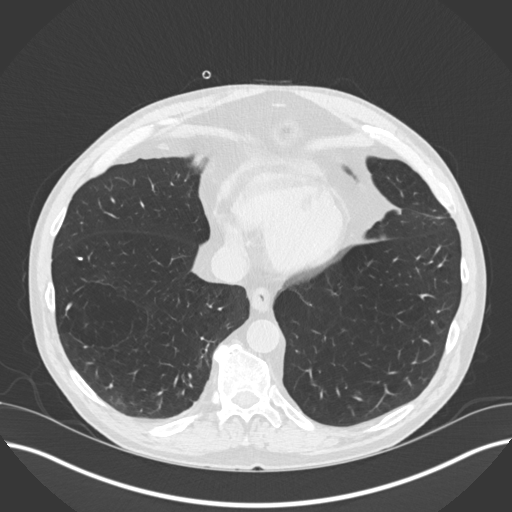
[im 57/171  mediastinal]
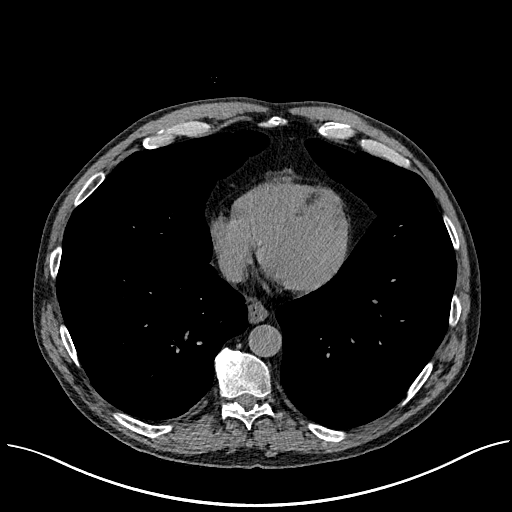
[im 57/171  lung]
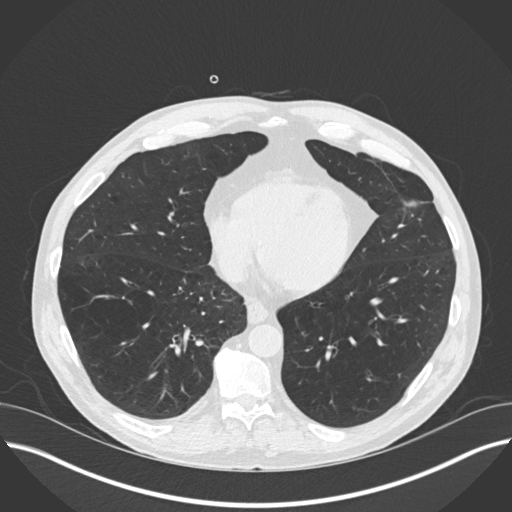
[im 69/171  lung]
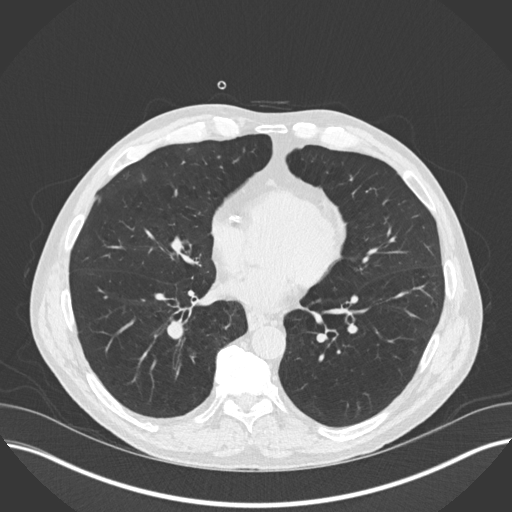
[im 76/171  lung]
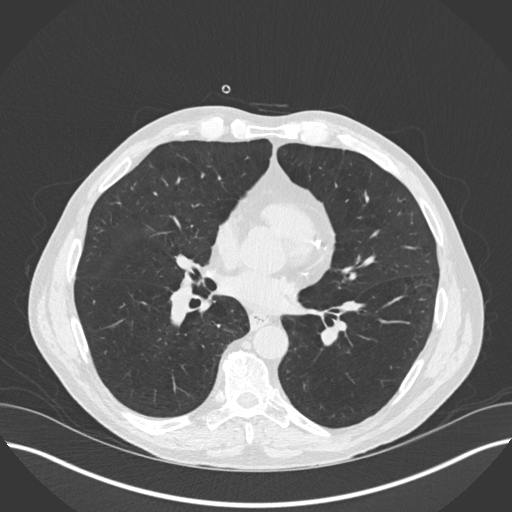
[im 89/171  lung]
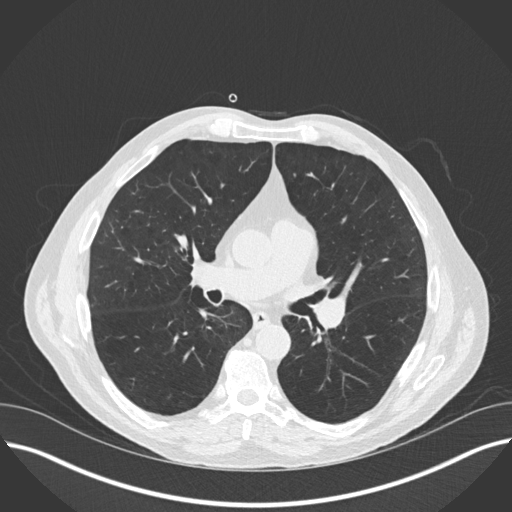
[im 95/171  mediastinal]
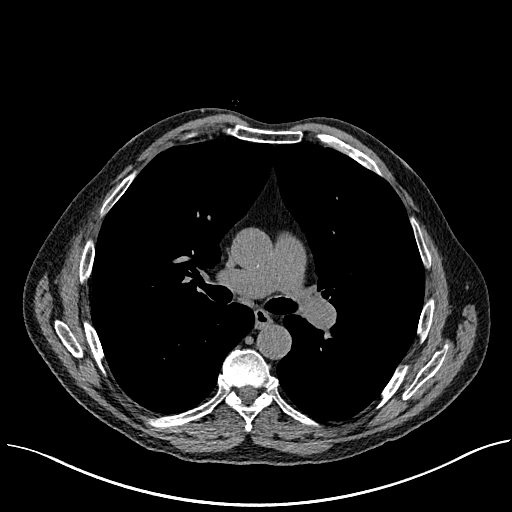
[im 95/171  lung]
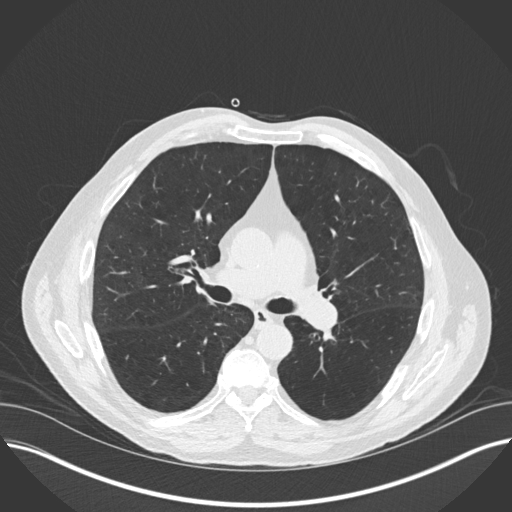
[im 103/171  lung]
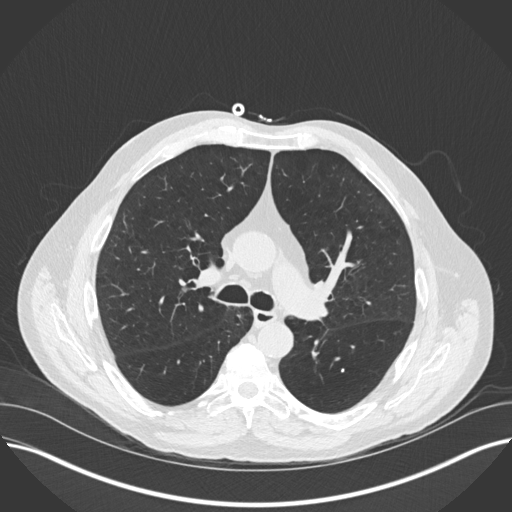
[im 114/171  lung]
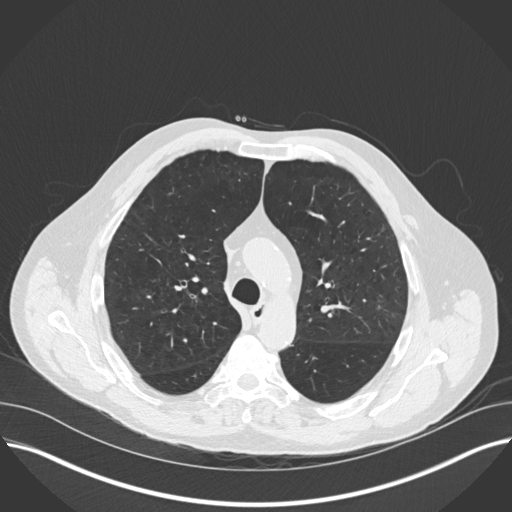
[im 126/171  lung]
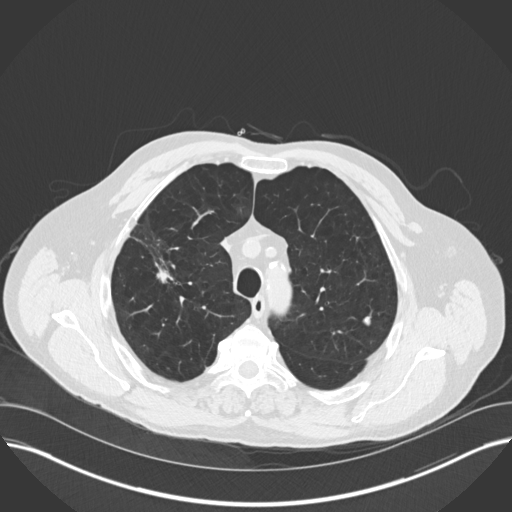
[im 137/171  mediastinal]
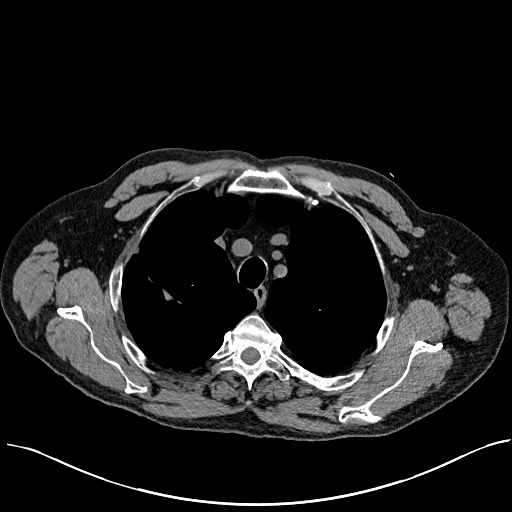
[im 137/171  lung]
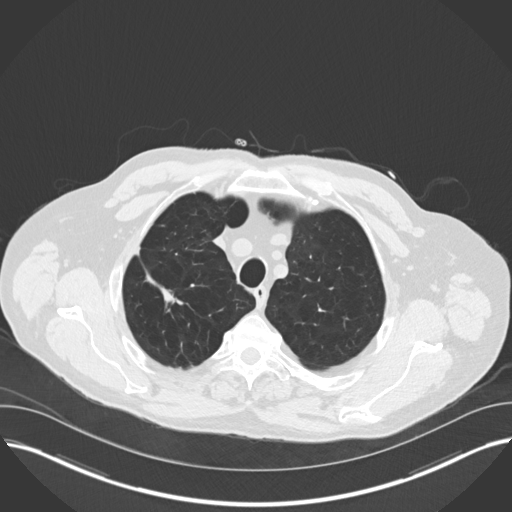
[im 145/171  lung]
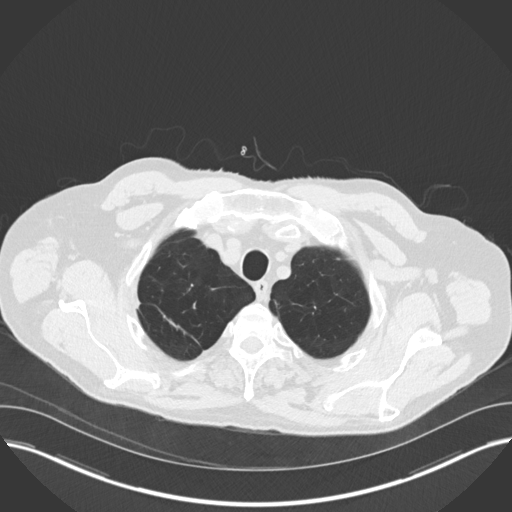
[im 158/171  lung]
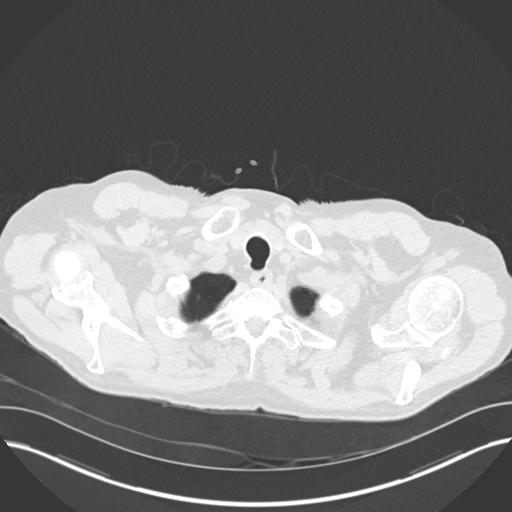

[15 of 34 positions shown; findings below may reference images not displayed]

FINDINGS: Cardiovascular: Heart is normal size. Suggestion of stent over the
right coronary and left anterior descending coronary arteries.
Thoracic aorta is normal in caliber. Pulmonary arterial system and
remaining vascular structures are unremarkable.

Mediastinum/Nodes: No mediastinal or hilar adenopathy. Remaining
mediastinal structures are unremarkable.

Lungs/Pleura: Lungs are adequately inflated with stable
emphysematous disease. Scarring over the right apex/upper lobe with
mild 2 bulbous areas along the scarring more inferiorly which are
unchanged. There is a new nodular focus along the most inferior
aspect of this scarring measuring 9 mm (12 x 6 mm). Near resolution
of previously seen nodule over the posterior right upper lobe.
Several stable bilateral subcentimeter nodules. Stable 1 cm nodule
over the posterior left upper lobe with central calcification as
this is stable since [LI] and benign. New round 7 mm nodule (image
60, series 3) over the posteroinferior aspect of the left upper lobe
subcentimeter nodule over the superior segment left lower lobe
unchanged. No acute airspace process or effusion. Airways are
normal.

Upper Abdomen: Calcified plaque over the abdominal aorta. Stable
cm left adrenal adenoma.

Musculoskeletal: No acute findings.
IMPRESSION: 1. Multiple bilateral stable pulmonary nodules. New 7 mm nodule over
the posterior left upper lobe. Stable nodularity over an area of
right upper lobe/apical scarring with new 9 mm nodular focus along
the most inferior aspect of this scarring. This area has shown to
wax and MOATSHE on previous exams and is most likely inflammatory.
Recommend follow-up CT 3 months.

2.  Stable 2.6 cm left adrenal adenoma.

3. Aortic Atherosclerosis ([LI]-[LI]) and Emphysema ([LI]-[LI]).
Coronary stents.

## 2020-04-28 ENCOUNTER — Inpatient Hospital Stay: Payer: Medicare Other | Admitting: Hematology and Oncology

## 2020-04-30 ENCOUNTER — Emergency Department
Admission: EM | Admit: 2020-04-30 | Discharge: 2020-04-30 | Disposition: A | Discharge status: Home / Self Care | Payer: Medicare Other | Attending: Student in an Organized Health Care Education/Training Program | Admitting: Student in an Organized Health Care Education/Training Program

## 2020-04-30 ENCOUNTER — Encounter: Payer: Self-pay | Admitting: Emergency Medicine

## 2020-04-30 ENCOUNTER — Other Ambulatory Visit: Payer: Self-pay

## 2020-04-30 ENCOUNTER — Emergency Department: Payer: Medicare Other

## 2020-04-30 DIAGNOSIS — R0602 Shortness of breath: Secondary | ICD-10-CM | POA: Insufficient documentation

## 2020-04-30 DIAGNOSIS — J449 Chronic obstructive pulmonary disease, unspecified: Secondary | ICD-10-CM | POA: Insufficient documentation

## 2020-04-30 DIAGNOSIS — Z9981 Dependence on supplemental oxygen: Secondary | ICD-10-CM | POA: Diagnosis not present

## 2020-04-30 DIAGNOSIS — I252 Old myocardial infarction: Secondary | ICD-10-CM | POA: Diagnosis not present

## 2020-04-30 DIAGNOSIS — I110 Hypertensive heart disease with heart failure: Secondary | ICD-10-CM | POA: Diagnosis not present

## 2020-04-30 DIAGNOSIS — I5022 Chronic systolic (congestive) heart failure: Secondary | ICD-10-CM | POA: Diagnosis not present

## 2020-04-30 DIAGNOSIS — Z79899 Other long term (current) drug therapy: Secondary | ICD-10-CM | POA: Diagnosis not present

## 2020-04-30 DIAGNOSIS — Z7982 Long term (current) use of aspirin: Secondary | ICD-10-CM | POA: Insufficient documentation

## 2020-04-30 DIAGNOSIS — I2510 Atherosclerotic heart disease of native coronary artery without angina pectoris: Secondary | ICD-10-CM | POA: Insufficient documentation

## 2020-04-30 DIAGNOSIS — R0789 Other chest pain: Secondary | ICD-10-CM | POA: Diagnosis present

## 2020-04-30 DIAGNOSIS — Z87891 Personal history of nicotine dependence: Secondary | ICD-10-CM | POA: Insufficient documentation

## 2020-04-30 LAB — CBC WITH DIFFERENTIAL/PLATELET
Abs Immature Granulocytes: 0.02 10*3/uL (ref 0.00–0.07)
Basophils Absolute: 0 10*3/uL (ref 0.0–0.1)
Basophils Relative: 1 %
Eosinophils Absolute: 0.2 10*3/uL (ref 0.0–0.5)
Eosinophils Relative: 3 %
HCT: 42 % (ref 39.0–52.0)
Hemoglobin: 13.9 g/dL (ref 13.0–17.0)
Immature Granulocytes: 0 %
Lymphocytes Relative: 29 %
Lymphs Abs: 1.7 10*3/uL (ref 0.7–4.0)
MCH: 29.3 pg (ref 26.0–34.0)
MCHC: 33.1 g/dL (ref 30.0–36.0)
MCV: 88.4 fL (ref 80.0–100.0)
Monocytes Absolute: 0.6 10*3/uL (ref 0.1–1.0)
Monocytes Relative: 11 %
Neutro Abs: 3.2 10*3/uL (ref 1.7–7.7)
Neutrophils Relative %: 56 %
Platelets: 233 10*3/uL (ref 150–400)
RBC: 4.75 MIL/uL (ref 4.22–5.81)
RDW: 12.2 % (ref 11.5–15.5)
WBC: 5.8 10*3/uL (ref 4.0–10.5)
nRBC: 0 % (ref 0.0–0.2)

## 2020-04-30 LAB — COMPREHENSIVE METABOLIC PANEL
ALT: 23 U/L (ref 0–44)
AST: 19 U/L (ref 15–41)
Albumin: 4.2 g/dL (ref 3.5–5.0)
Alkaline Phosphatase: 65 U/L (ref 38–126)
Anion gap: 9 (ref 5–15)
BUN: 10 mg/dL (ref 8–23)
CO2: 32 mmol/L (ref 22–32)
Calcium: 9.7 mg/dL (ref 8.9–10.3)
Chloride: 98 mmol/L (ref 98–111)
Creatinine, Ser: 0.88 mg/dL (ref 0.61–1.24)
GFR calc Af Amer: >60 mL/min (ref >60)
GFR calc non Af Amer: >60 mL/min (ref >60)
Glucose, Bld: 118 mg/dL — ABNORMAL HIGH (ref 70–99)
Potassium: 3.2 mmol/L — ABNORMAL LOW (ref 3.5–5.1)
Sodium: 139 mmol/L (ref 135–145)
Total Bilirubin: 0.7 mg/dL (ref 0.3–1.2)
Total Protein: 7 g/dL (ref 6.5–8.1)

## 2020-04-30 LAB — TROPONIN I (HIGH SENSITIVITY): Troponin I (High Sensitivity): 8 ng/L (ref <18)

## 2020-04-30 IMAGING — CR DG CHEST 2V
2 series · 2 of 2 positions shown · non-contrast
Comparison: CT [DATE], radiograph [DATE]

CLINICAL DATA: Chest pain, no distress

EXAM:
CHEST - 2 VIEW

[chest pa]
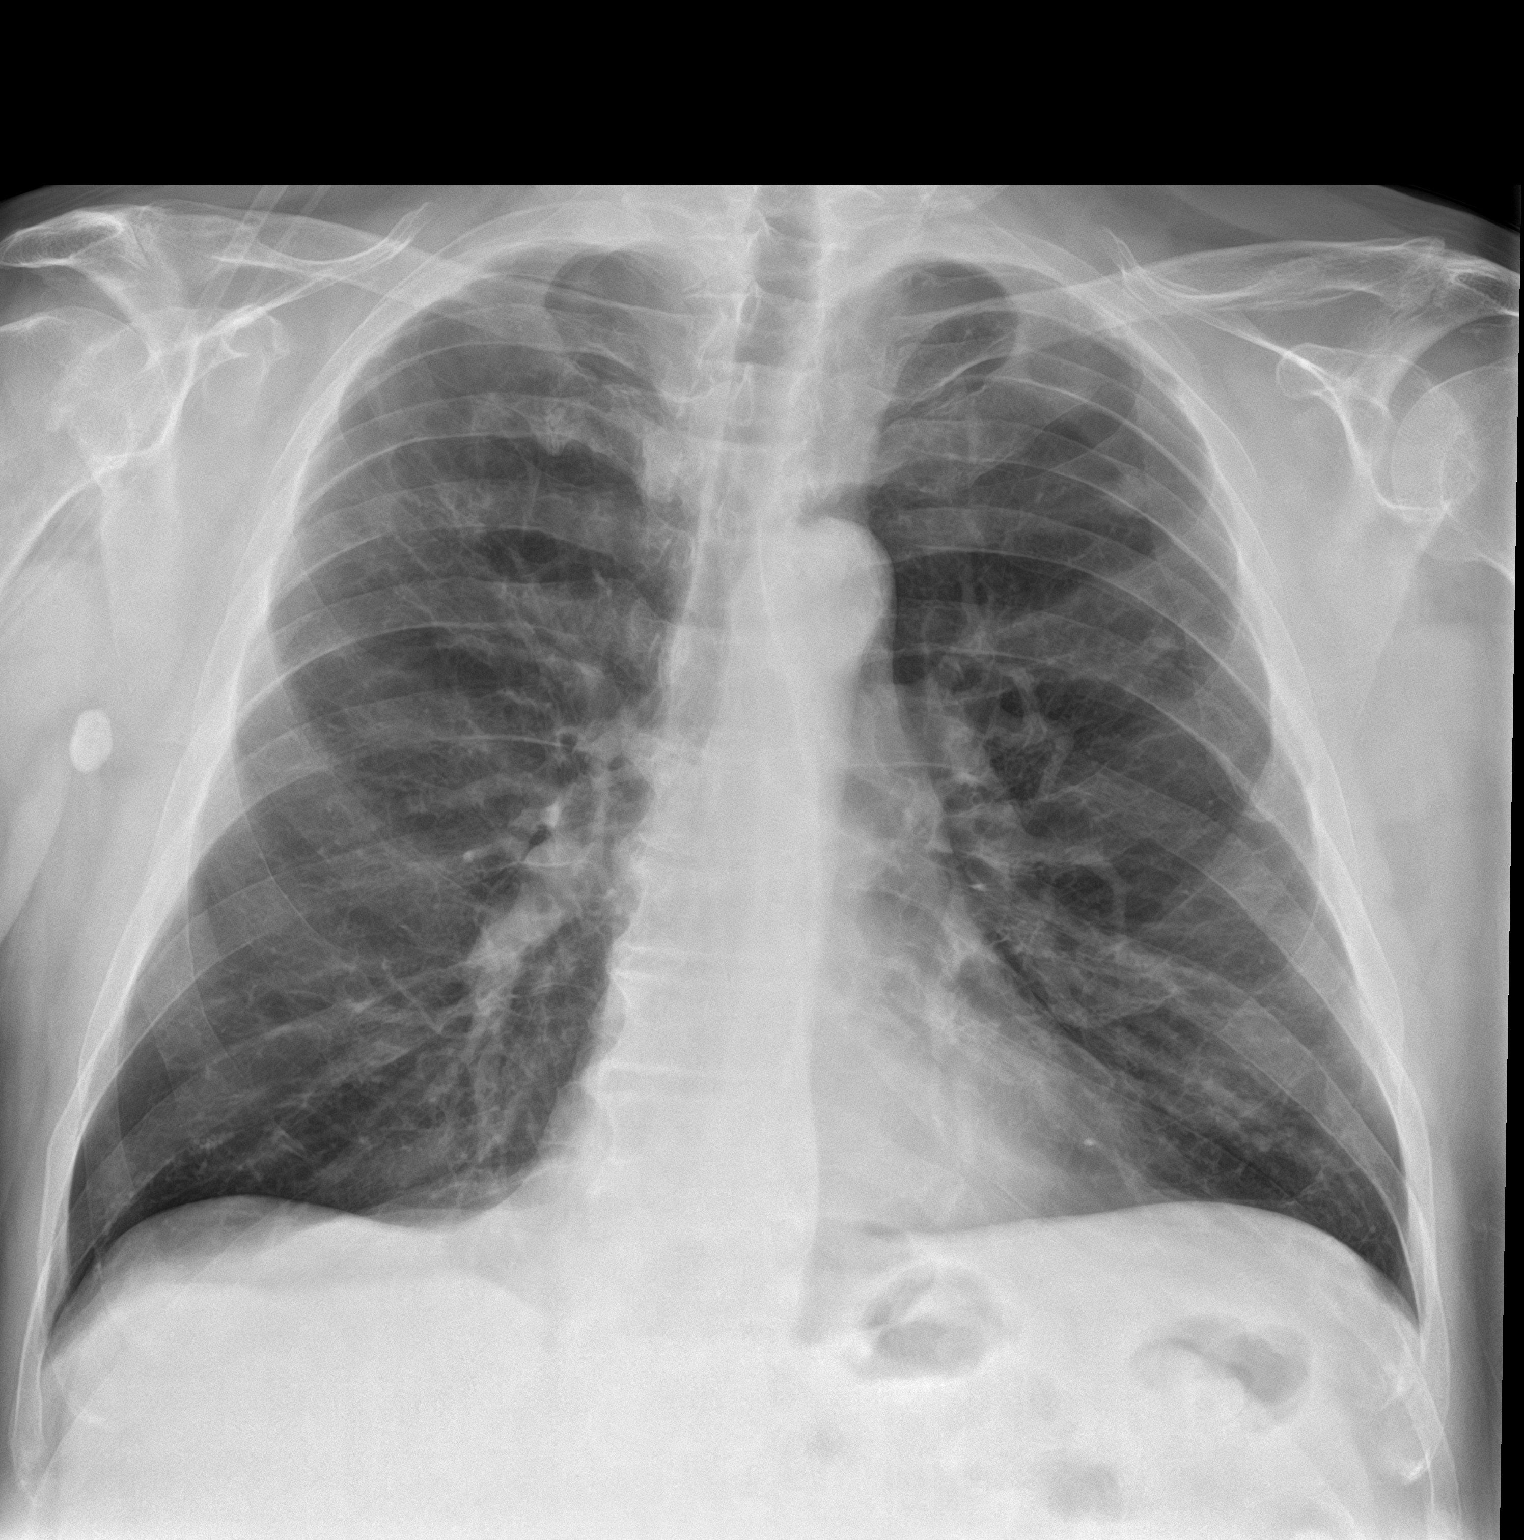

[chest lat]
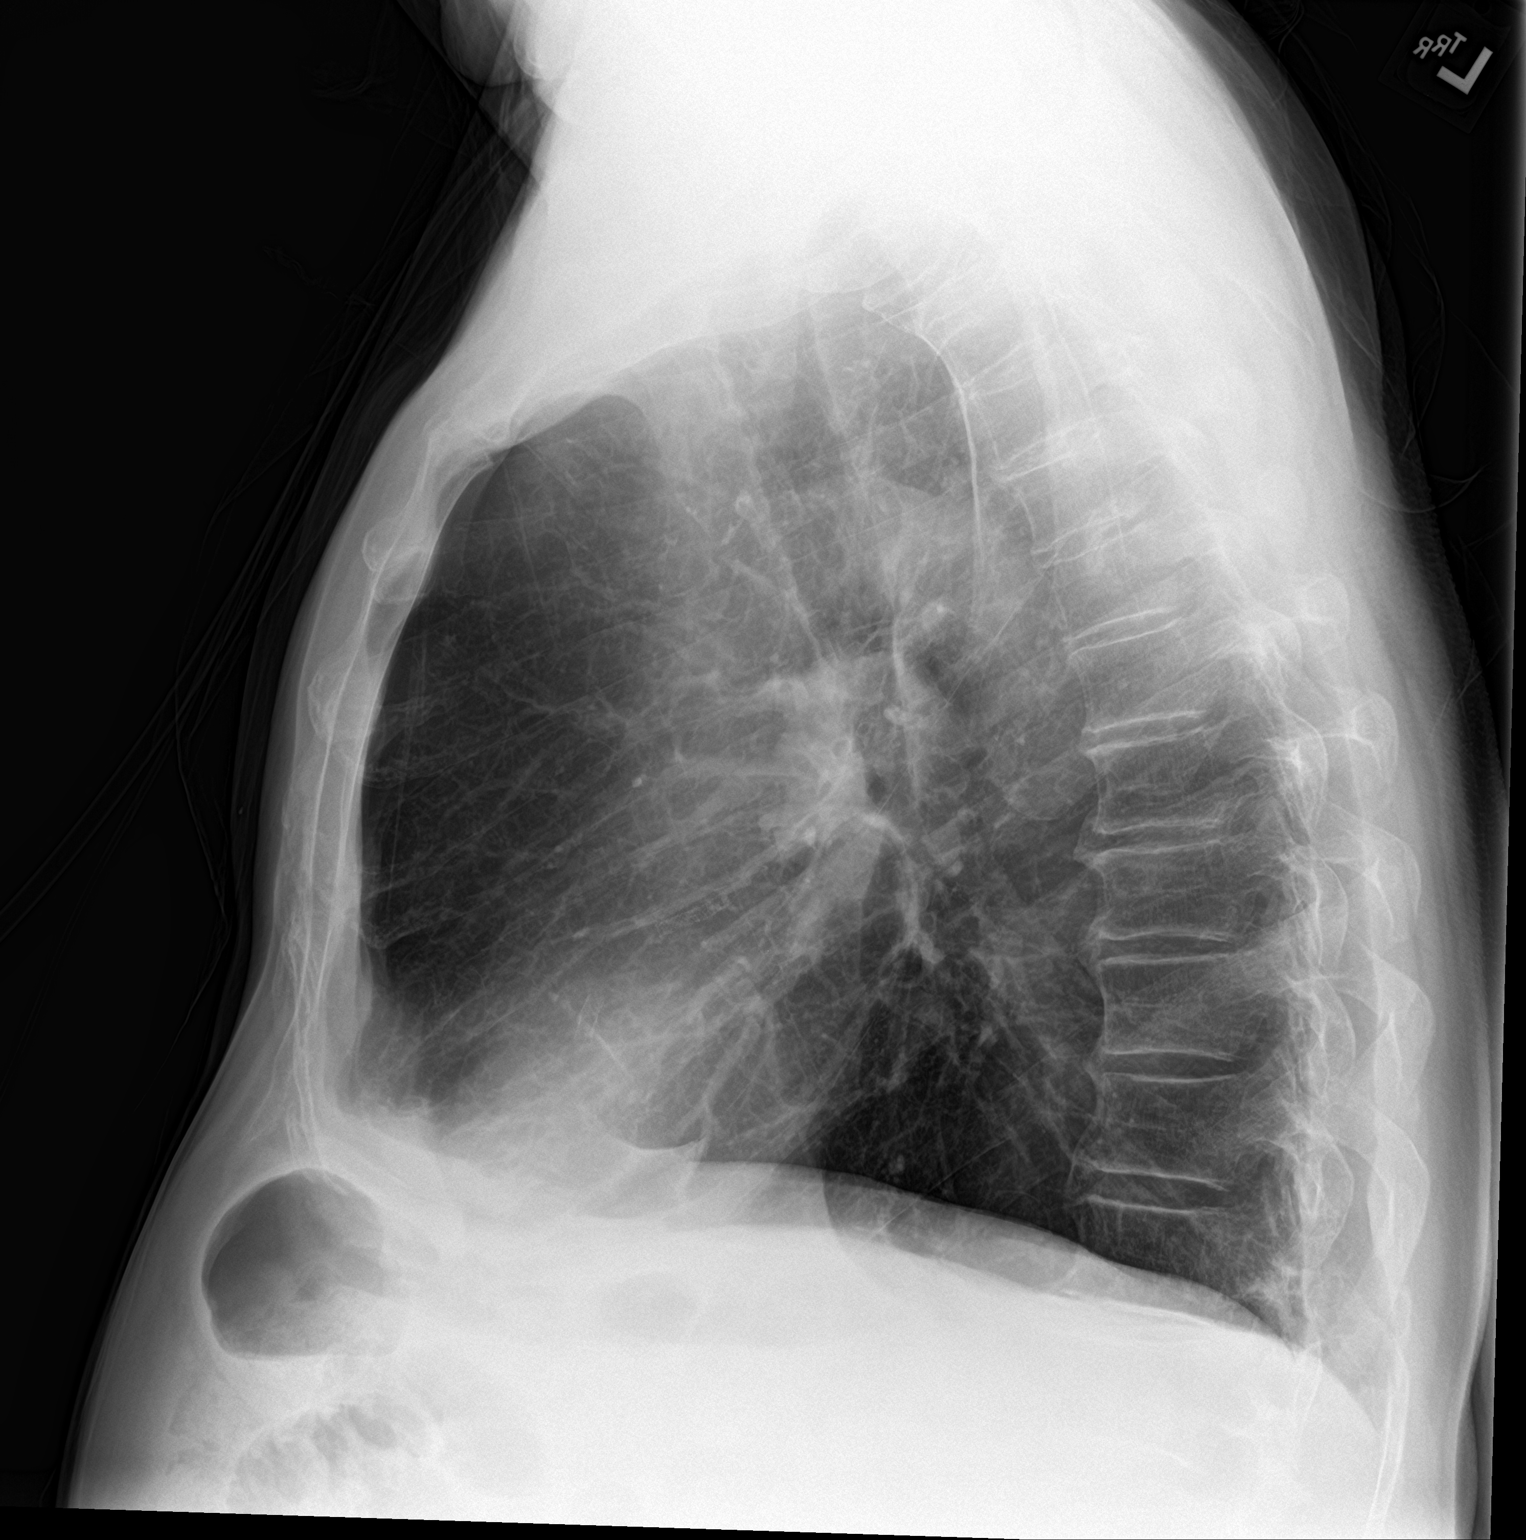

[2 of 2 positions shown; findings below may reference images not displayed]

FINDINGS: Chronic emphysematous changes as well as more reticulonodular
architectural distortion seen in the right upper lobe. Additional
stable nodule seen towards the left lung apex as well. No new
consolidative opacity. No pneumothorax or visible effusion. Cardiac
size within normal limits. The aorta is calcified. The remaining
cardiomediastinal contours are unremarkable. No acute osseous or
soft tissue abnormality. Degenerative changes are present in the
imaged spine and shoulders.
IMPRESSION: 1. No acute cardiopulmonary abnormality.
2. Chronic emphysematous changes
3. Reticulonodular architectural distortion/scarring in the right
upper lobe and additional nodule towards the left lung apex are
grossly unchanged from recent CT and radiography.
4.  Aortic Atherosclerosis ([M8]-[M8]).

## 2020-04-30 MED ORDER — IPRATROPIUM-ALBUTEROL 0.5-2.5 (3) MG/3ML IN SOLN
3.0000 mL | Freq: Once | RESPIRATORY_TRACT | Status: AC
  Administered 2020-04-30: 3 mL via RESPIRATORY_TRACT
  Filled 2020-04-30: qty 3

## 2020-04-30 MED ORDER — PREDNISONE 20 MG PO TABS
40.0000 mg | ORAL_TABLET | Freq: Once | ORAL | Status: AC
  Administered 2020-04-30: 21:00:00 40 mg via ORAL
  Filled 2020-04-30: qty 2

## 2020-04-30 MED ORDER — PREDNISONE 20 MG PO TABS: 40.0000 mg | ORAL_TABLET | Freq: Every day | ORAL | 0 refills | Status: AC

## 2020-04-30 MED ORDER — LORAZEPAM 1 MG PO TABS
1.0000 mg | ORAL_TABLET | Freq: Once | ORAL | Status: AC
  Administered 2020-04-30: 1 mg via ORAL
  Filled 2020-04-30: qty 1

## 2020-04-30 NOTE — ED Notes (Signed)
Duonebs given through mask per patient's request. Room darkened per patient's request.

## 2020-04-30 NOTE — ED Notes (Signed)
Patient states he is ready to go home and will call for a ride out in the lobby. Dr. Quentin Cornwall aware.

## 2020-04-30 NOTE — ED Triage Notes (Signed)
Pt to triage via w/c with no distress noted, mask in place; pt reports SHOB and left sided CP all week; O2 at 2l/min via [place] at all times

## 2020-04-30 NOTE — ED Provider Notes (Signed)
[facility] Emergency Department Provider Note    First MD Initiated Contact with Patient 04/30/20 2051     (approximate)  I have reviewed the triage vital signs and the nursing notes.   HISTORY  Chief Complaint No chief complaint on file.    HPI Leslie Sims is a 69 y.o. male with the below listed past medical history on 2 L nasal cannula for COPD presents the ER for 1week of chest discomfort.  Denies any pain.  Does endorse some increasing wheezing.  States that he feels like his anxiety is acting up.  Does have a history of anxiety depression was recently started on Ativan.  Denies any diaphoresis.  Denies any pleuritic discomfort.  States he does have outpatient psychiatry.  Denies any SI or HI.    Past Medical History:  Diagnosis Date  . Allergic rhinitis   . Anxiety   . Back pain   . Colon polyps 08/2012   colonoscopy; multiple colon polyps; repeat colonoscopy in one year.  [name] COPD (chronic obstructive pulmonary disease) ([place])    a. on home O2.  . Coronary artery disease 11/2009   a. late presenting ant MI; b. LHC 100% pLAD s/p PCI/DES, 99% mRCA s/p PCI/DES, EF 35%; c. nuclear stress test 05/13: prior ant/inf infarcts w/o ischemia, EF 43%; d. LHC 02/14: widely patent stents with no other obs dz, EF 40%; e. LHC 11/17: LM nl, mLAD 10%, patent LAD stent, dLAD 20%, p-mRCA 10%, mRCA 40%, patent RCA stent, EF 35-45%   . Depression   . GERD (gastroesophageal reflux disease)   . Headache(784.0)   . Helicobacter pylori (H. pylori)   . Hemorrhoids   . Hernia    inguinal  . HFrEF (heart failure with reduced ejection fraction) ([place])    a. 10/2016 LV gram: EF 35-45%; b. 06/2018 Echo: EF 40-45%.  . Hyperlipidemia   . Hypertension   . Insomnia   . Ischemic cardiomyopathy    a. 10/2016 LV gram: EF 35-45%; b. 06/2018 Echo: EF 40-45%.  . Myalgia   . Peptic ulcer   . Pulmonary nodules   . ST elevation (STEMI) myocardial infarction involving left  anterior descending coronary artery ([place]) 11/2009   a. s/p PCI to the LAD  . Status post dilation of esophageal narrowing 2000  . Tinea pedis   . Wears dentures    partial upper   Family History  Problem Relation Age of Onset  . Heart attack Brother        Brother #1  . Diabetes Brother   . Hypertension Brother        #3  . Coronary artery disease Father 81       deceased  . Heart attack Father   . Diabetes Father   . Heart disease Father   . COPD Mother 41       deceased  . Alcohol abuse Sister        polysubstance abuse  . COPD Sister   . Lung cancer Sister   . Alcohol abuse Sister        polysubstance abuse  . Penile cancer Brother   . Diabetes Brother    Past Surgical History:  Procedure Laterality Date  . Admission  12/20/2012   COPD exacerbation.  [place].  [name] CARDIAC CATHETERIZATION    . CARDIAC CATHETERIZATION  02/05/13   [facility]  . CARDIAC CATHETERIZATION  10/14   [facility] : patent stents with no change in anatomy. EF: 40$  .  CARDIAC CATHETERIZATION Left 11/12/2016   Procedure: Left Heart Cath and Coronary Angiography;  Surgeon: Wellington Hampshire, MD;  Location: [place] CV LAB;  Service: Cardiovascular;  Laterality: Left;  . COLONOSCOPY    . COLONOSCOPY WITH PROPOFOL N/A 05/18/2016   Procedure: COLONOSCOPY WITH PROPOFOL;  Surgeon: Lucilla Lame, MD;  Location: [facility] ENDOSCOPY;  Service: Endoscopy;  Laterality: N/A;  . COLONOSCOPY WITH PROPOFOL N/A 03/19/2020   Procedure: COLONOSCOPY WITH PROPOFOL;  Surgeon: Lin Landsman, MD;  Location: [facility] ENDOSCOPY;  Service: Gastroenterology;  Laterality: N/A;  . CORONARY ANGIOPLASTY WITH STENT PLACEMENT  09/2009   LAD 3.0 X23 mm Xience DES, RCA: 4.0 X 15 mm Xience DES  . ELECTROMAGNETIC NAVIGATION BROCHOSCOPY N/A 10/27/2017   Procedure: ELECTROMAGNETIC NAVIGATION BRONCHOSCOPY;  Surgeon: Flora Lipps, MD;  Location: [facility] ORS;  Service: Cardiopulmonary;  Laterality: N/A;  . ESOPHAGEAL DILATION    . ESOPHAGOGASTRODUODENOSCOPY   2008  . ESOPHAGOGASTRODUODENOSCOPY  08/20/2012  . HERNIA REPAIR  07/20/2012   L inguinal hernia repair  . PENILE PROSTHESIS IMPLANT     Patient Active Problem List   Diagnosis Date Noted  . Goals of care, counseling/discussion   . Palliative care by specialist   . Healthcare-associated pneumonia 01/28/2019  . COPD with acute exacerbation ([place]) 12/31/2018  . Acute respiratory failure with hypoxia ([place]) 09/29/2018  . Acute respiratory failure with hypoxia and hypercapnia ([place]) 09/13/2018  . Acute on chronic respiratory failure with hypoxia and hypercapnia ([place]) 08/24/2018  . Chest pain 07/02/2018  . Grief reaction 01/13/2018  . COPD with hypoxia ([place]) 12/25/2017  . Unstable angina ([place])   . Benign neoplasm of ascending colon   . Benign neoplasm of descending colon   . Benign neoplasm of sigmoid colon   . History of colonic polyps   . Pulmonary nodules/lesions, multiple 01/05/2015  . Bruit of left carotid artery 11/04/2014  . Sleep disorder 07/18/2013  . Seasonal and perennial allergic rhinitis 05/29/2013  . Bradycardia 04/20/2011  . SMOKER 07/30/2010  . CAD, NATIVE VESSEL 04/23/2010  . Hyperlipidemia 03/24/2010  . DEPRESSION/ANXIETY 03/24/2010  . Chronic systolic heart failure ([place]) 03/24/2010  . COPD, very severe ([place]) 03/24/2010  . GERD 03/24/2010  . Essential hypertension 11/21/2009      Prior to Admission medications   Medication Sig Start Date End Date Taking? Authorizing Provider  albuterol (PROVENTIL HFA;VENTOLIN HFA) 108 (90 Base) MCG/ACT inhaler Inhale 2 puffs into the lungs every 6 (six) hours as needed for wheezing or shortness of breath. 11/01/18   Rudene Re, MD  aspirin 81 MG tablet Take 81 mg by mouth daily.      [provider]  atorvastatin (LIPITOR) 40 MG tablet Take 1 tablet (40 mg total) by mouth daily. 03/03/18   [name], MD  azithromycin [facility]) 250 MG tablet Take by mouth. 02/15/20 02/14/21  [provider]    Fluticasone-Salmeterol (ADVAIR) 500-50 MCG/DOSE AEPB Inhale 1 puff into the lungs every 12 (twelve) hours. 11/29/18   [provider]  furosemide (LASIX) 20 MG tablet Take 1 tablet (20 mg total) by mouth daily. 07/07/18 12/04/19  [name], MD  furosemide (LASIX) 20 MG tablet Take 20 mg by mouth.    [provider]  guaiFENesin-dextromethorphan (ROBITUSSIN DM) 100-10 MG/5ML syrup Take 5 mLs by mouth every 4 (four) hours as needed for cough. 01/31/19   [name], MD  hydrOXYzine (VISTARIL) 25 MG capsule Take 25 mg by mouth 2 (two) times daily as needed for anxiety or itching.  06/05/18   [provider]  ipratropium-albuterol (DUONEB) 0.5-2.5 (3) MG/3ML SOLN USE 1 VIAL VIA NEBULIZER AND INHALE BY MOUTH 4 TIMES A DAY 01/21/20   [name], MD  LORazepam (ATIVAN) 1 MG tablet Take 1 tablet (1 mg total) by mouth every 4 (four) hours while awake. 02/14/19   [name], MD  losartan (COZAAR) 25 MG tablet Take 25 mg by mouth daily. 02/23/19   [provider]  mirtazapine (REMERON) 30 MG tablet Take 1 tablet (30 mg total) by mouth at bedtime. 06/24/18   [name], MD  Multiple Vitamin (MULTIVITAMIN) capsule Take 1 capsule by mouth daily.    [provider]  nitroGLYCERIN (NITROSTAT) 0.4 MG SL tablet Place 1 tablet (0.4 mg total) under the tongue every 5 (five) minutes as needed. 10/17/17   Wellington Hampshire, MD  OLANZapine (ZYPREXA) 2.5 MG tablet Take 2.5 mg by mouth at bedtime. 01/19/19   [provider]  OXYGEN Inhale 3 L into the lungs daily.     [provider]  predniSONE (DELTASONE) 20 MG tablet Take 2 tablets (40 mg total) by mouth daily for 4 days. 04/30/20 05/04/20  Merlyn Lot, MD    Allergies Prozac [fluoxetine hcl], Effexor xr [venlafaxine hcl er], and Wellbutrin [bupropion]    Social History Social History   Tobacco Use  . Smoking status: Former Smoker    Packs/day: 1.00    Years: 42.00    Pack  years: 42.00    Types: Cigarettes    Quit date: 02/10/2019    Years since quitting: 1.2  . Smokeless tobacco: Never Used  Substance Use Topics  . Alcohol use: No    Alcohol/week: 0.0 standard drinks  . Drug use: No    Review of Systems Patient denies headaches, rhinorrhea, blurry vision, numbness, shortness of breath, chest pain, edema, cough, abdominal pain, nausea, vomiting, diarrhea, dysuria, fevers, rashes or hallucinations unless otherwise stated above in HPI. ____________________________________________   PHYSICAL EXAM:  VITAL SIGNS: Vitals:   04/30/20 2200 04/30/20 2230  BP: (!) 145/83 (!) 143/84  Pulse: 72 74  Resp:    Temp:    SpO2: 100% 99%    Constitutional: Alert and oriented.  Eyes: Conjunctivae are normal.  Head: Atraumatic. Nose: No congestion/rhinnorhea. Mouth/Throat: Mucous membranes are moist.   Neck: No stridor. Painless ROM.  Cardiovascular: Normal rate, regular rhythm. Grossly normal heart sounds.  Good peripheral circulation. Respiratory: Normal respiratory effort.  No retractions. Lungs with faint expiratory wheeze throughtout Gastrointestinal: Soft and nontender. No distention. No abdominal bruits. No CVA tenderness. Genitourinary:  Musculoskeletal: No lower extremity tenderness nor edema.  No joint effusions. Neurologic:  Normal speech and language. No gross focal neurologic deficits are appreciated. No facial droop Skin:  Skin is warm, dry and intact. No rash noted. Psychiatric: Mood and affect are normal. Speech and behavior are normal.  ____________________________________________   LABS (all labs ordered are listed, but only abnormal results are displayed)  Results for orders placed or performed during the hospital encounter of 04/30/20 (from the past 24 hour(s))  CBC with Differential     Status: None   Collection Time: 04/30/20  7:42 PM  Result Value Ref Range   WBC 5.8 4.0 - 10.5 K/uL   RBC 4.75 4.22 - 5.81 MIL/uL   Hemoglobin 13.9  13.0 - 17.0 g/dL   HCT 42.0 39.0 - 52.0 %   MCV 88.4 80.0 - 100.0 fL   MCH 29.3 26.0 - 34.0 pg  MCHC 33.1 30.0 - 36.0 g/dL   RDW 12.2 11.5 - 15.5 %   Platelets 233 150 - 400 K/uL   nRBC 0.0 0.0 - 0.2 %   Neutrophils Relative % 56 %   Neutro Abs 3.2 1.7 - 7.7 K/uL   Lymphocytes Relative 29 %   Lymphs Abs 1.7 0.7 - 4.0 K/uL   Monocytes Relative 11 %   Monocytes Absolute 0.6 0.1 - 1.0 K/uL   Eosinophils Relative 3 %   Eosinophils Absolute 0.2 0.0 - 0.5 K/uL   Basophils Relative 1 %   Basophils Absolute 0.0 0.0 - 0.1 K/uL   Immature Granulocytes 0 %   Abs Immature Granulocytes 0.02 0.00 - 0.07 K/uL  Comprehensive metabolic panel     Status: Abnormal   Collection Time: 04/30/20  7:42 PM  Result Value Ref Range   Sodium 139 135 - 145 mmol/L   Potassium 3.2 (L) 3.5 - 5.1 mmol/L   Chloride 98 98 - 111 mmol/L   CO2 32 22 - 32 mmol/L   Glucose, Bld 118 (H) 70 - 99 mg/dL   BUN 10 8 - 23 mg/dL   Creatinine, Ser 0.88 0.61 - 1.24 mg/dL   Calcium 9.7 8.9 - 10.3 mg/dL   Total Protein 7.0 6.5 - 8.1 g/dL   Albumin 4.2 3.5 - 5.0 g/dL   AST 19 15 - 41 U/L   ALT 23 0 - 44 U/L   Alkaline Phosphatase 65 38 - 126 U/L   Total Bilirubin 0.7 0.3 - 1.2 mg/dL   GFR calc non Af Amer >60 >60 mL/min   GFR calc Af Amer >60 >60 mL/min   Anion gap 9 5 - 15  Troponin I (High Sensitivity)     Status: None   Collection Time: 04/30/20  7:42 PM  Result Value Ref Range   Troponin I (High Sensitivity) 8 <18 ng/L   ____________________________________________  EKG My review and personal interpretation at Time: 19:40   Indication: sob  Rate: 70  Rhythm: sinus Axis: left Other: normal intervals, nonspecific st abn that is concistent with previous tracings.  No stemi or depressions ____________________________________________  RADIOLOGY  I personally reviewed all radiographic images ordered to evaluate for the above acute complaints and reviewed radiology reports and findings.  These findings were  personally discussed with the patient.  Please see medical record for radiology report.  ____________________________________________   PROCEDURES  Procedure(s) performed:  Procedures    Critical Care performed: no ____________________________________________   INITIAL IMPRESSION / ASSESSMENT AND PLAN / ED COURSE  Pertinent labs & imaging results that were available during my care of the patient were reviewed by me and considered in my medical decision making (see chart for details).   DDX: copd, chf, acs, anxiety, gastritis, pna  Leslie Sims is a 69 y.o. who presents to the ED with symptoms as described above.  Patient is nontoxic-appearing does have significant comorbidities but his EKG appears nonischemic his troponin is normal.  I do not see any evidence of congestive heart failure or pneumonia.  This is not consistent with dissection or PE symptoms have been ongoing for several days patient states himself that he feels this is stress related which I think clinically is most likely the scenario.  He does have some wheezing therefore I will give steroid and nebs may have a component of COPD but will get also give him a dose of Ativan and observe in the ER.  Clinical Course as of May 12  [place] Apr 30, 2020  2239 Patient reassessed.  Feels well.  Denies any chest pain.  Troponin negative after 5 days of symptoms.  Feels improved after anxiolysis.  Again denies any SI or HI.  On repeat exam does have improvement in his air movement.  No respiratory distress and satting well on room air.  He has been speaking in complete sentences the entire time.  I think is appropriate for outpatient follow-up.   [PR]    Clinical Course User Index [PR] Merlyn Lot, MD    The patient was evaluated in Emergency Department today for the symptoms described in the history of present illness. He/she was evaluated in the context of the global COVID-19 pandemic, which necessitated  consideration that the patient might be at risk for infection with the SARS-CoV-2 virus that causes COVID-19. Institutional protocols and algorithms that pertain to the evaluation of patients at risk for COVID-19 are in a state of rapid change based on information released by regulatory bodies including the CDC and federal and state organizations. These policies and algorithms were followed during the patient's care in the ED.  As part of my medical decision making, I reviewed the following data within the [facility] notes reviewed and incorporated, Labs reviewed, notes from prior ED visits and [place] Controlled Substance Database   ____________________________________________   FINAL CLINICAL IMPRESSION(S) / ED DIAGNOSES  Final diagnoses:  Shortness of breath      NEW MEDICATIONS STARTED DURING THIS VISIT:  Discharge Medication List as of 04/30/2020 10:40 PM    START taking these medications   Details  predniSONE (DELTASONE) 20 MG tablet Take 2 tablets (40 mg total) by mouth daily for 4 days., Starting Wed 04/30/2020, Until Sun 05/04/2020, Normal         Note:  This document was prepared using Dragon voice recognition software and may include unintentional dictation errors.    Merlyn Lot, MD 04/30/20 2352

## 2020-05-05 ENCOUNTER — Other Ambulatory Visit: Payer: Self-pay

## 2020-05-05 NOTE — Progress Notes (Signed)
Patient wants to discuss CT results. He states that he will bring in a medication list tomorrow so we can review those and make sure our list is up to date.

## 2020-05-05 NOTE — Progress Notes (Signed)
[facility]  [address], [address] [place], [place] [address] Phone: [phone]  Fax: [phone]   Clinic Day:  05/06/2020  Referring physician: [name], MD  Chief Complaint: Leslie Sims [facility] is a 69 y.o. male with bilateral pulmonary nodules who is seen for 6 month assessment.   HPI: The patient was last seen in the medical oncoolgy clinic on 11/05/2019. At that time, he felt he was having a COPD flare. Exam revealed right sided wheezes. Hematocrit 40.8, hemoglobin 12.9, platelets 199,000, WBC 7,100. CMP was normal. I encouraged the patient to follow up with Dr. Fletcher Anon and Dr. Patsey Berthold.   He saw Dr. Fletcher Anon on 12/04/2019 for a follow up. He felt much better since he quit smoking. He continued to use 2 L/min nasal cannula oxygen.  He had chronic exertional dyspnea and occasional chest discomfort but not very frequent.  Patient was directed to continue his medical therapy and follow up in 12 months.   Colonoscopy with Dr. Marius Ditch on 03/19/2020 revealed three 5 mm polyps in the rectum and in the descending colon.  There was diverticulosis in the sigmoid colon. There were non-bleeding external hemorrhoids.  Pathology revealed tubular adenoma and hyperplastic polyp, negative for high-grade dysplasia and malignancy.   Patient was seen by Dr. Patsey Berthold on 04/21/2020. Prior to visit the patient was seen in the ER twice for shortness of breath. He was feeling better. He felt that his dyspnea was at baseline (moderate to severe).  He had issues with gastroesophageal reflux and had been recently placed on Carafate by GI; he felt that this was helping his reflux.  Prior PFTs showed severe obstructive defect with no bronchodilator response.  Since his last visit to the ER he had not had any fevers, chills or sweats.  He had no change in his cough or sputum.  He denied hemoptysis.  He denied orthopnea, paroxysmal nocturnal dyspnea, chest pain, lower extremity edema and calf  tenderness. Duonebs and Advair continued.  PFTs were planned.  Oxygen was decreased to 2 liters/min, pulsed.  Information and referral to AIRFLOW 3 trial ([place]) for consideration of TLD.  Chest CT on 04/23/2020 revealed multiple bilateral stable pulmonary nodules. There was a new 7 mm nodule over the posterior left upper lobe. There was stable nodularity over an area of right upper lobe/apical scarring with a new 9 mm nodular focus along the most inferior aspect of this scarring. The area had shown to wax and wane on previous exams and was most likely inflammatory. Recommendation was follow-up chest CT 3 months. There was a stable 2.6 cm left adrenal adenoma.  He was seen in the [facility] ER on 04/30/2020 for chest discomfort x 1 week.  He had some increasing wheezing. He felt like his anxiety was acting up.  He had recently been started on Ativan.  He denied any diaphoresis or pleuritic discomfort.  Exam revealed a faint expiratory wheeze throughout.  Troponin was negative.  EKG was normal.  CXR revealed no acute cardiopulmonary abnormality. He was treated with steroids, nebs and Ativan. He was discharged with prednisone and advised to take two 40 mg tablets daily x 4 days.   During the interim, the patient has been feeling good. He recently completed his steroids for COPD flare. He notes having depression and anxiety. He denies any neurological symptoms. His wheezing is persistent but has improved since last visit. He has a nonproductive cough and shortness of breath. The patient is on 4 liters/min of oxygen  when wearing a mask. At home oxygen is 2.5 liters/min.  He has no abdominal symptoms. The patient has not had a cigarette in 14 months. I encouraged ongoing smoking cessation. He had the Moderna vaccinations (01/26/2020, 02/23/2020).    Past Medical History:  Diagnosis Date  . Allergic rhinitis   . Anxiety   . Back pain   . Colon polyps 08/2012   colonoscopy; multiple colon polyps; repeat colonoscopy  in one year.  [name] COPD (chronic obstructive pulmonary disease) ([place])    a. on home O2.  . Coronary artery disease 11/2009   a. late presenting ant MI; b. LHC 100% pLAD s/p PCI/DES, 99% mRCA s/p PCI/DES, EF 35%; c. nuclear stress test 05/13: prior ant/inf infarcts w/o ischemia, EF 43%; d. LHC 02/14: widely patent stents with no other obs dz, EF 40%; e. LHC 11/17: LM nl, mLAD 10%, patent LAD stent, dLAD 20%, p-mRCA 10%, mRCA 40%, patent RCA stent, EF 35-45%   . Depression   . GERD (gastroesophageal reflux disease)   . Headache(784.0)   . Helicobacter pylori (H. pylori)   . Hemorrhoids   . Hernia    inguinal  . HFrEF (heart failure with reduced ejection fraction) ([place])    a. 10/2016 LV gram: EF 35-45%; b. 06/2018 Echo: EF 40-45%.  . Hyperlipidemia   . Hypertension   . Insomnia   . Ischemic cardiomyopathy    a. 10/2016 LV gram: EF 35-45%; b. 06/2018 Echo: EF 40-45%.  . Myalgia   . Peptic ulcer   . Pulmonary nodules   . ST elevation (STEMI) myocardial infarction involving left anterior descending coronary artery ([place]) 11/2009   a. s/p PCI to the LAD  . Status post dilation of esophageal narrowing 2000  . Tinea pedis   . Wears dentures    partial upper    Past Surgical History:  Procedure Laterality Date  . Admission  12/20/2012   COPD exacerbation.  [place].  [name] CARDIAC CATHETERIZATION    . CARDIAC CATHETERIZATION  02/05/13   [facility]  . CARDIAC CATHETERIZATION  10/14   [facility] : patent stents with no change in anatomy. EF: 40$  . CARDIAC CATHETERIZATION Left 11/12/2016   Procedure: Left Heart Cath and Coronary Angiography;  Surgeon: Wellington Hampshire, MD;  Location: [facility];  Service: Cardiovascular;  Laterality: Left;  . COLONOSCOPY    . COLONOSCOPY WITH PROPOFOL N/A 05/18/2016   Procedure: COLONOSCOPY WITH PROPOFOL;  Surgeon: Lucilla Lame, MD;  Location: [facility] ENDOSCOPY;  Service: Endoscopy;  Laterality: N/A;  . COLONOSCOPY WITH PROPOFOL N/A 03/19/2020   Procedure: COLONOSCOPY WITH  PROPOFOL;  Surgeon: Lin Landsman, MD;  Location: [facility] ENDOSCOPY;  Service: Gastroenterology;  Laterality: N/A;  . CORONARY ANGIOPLASTY WITH STENT PLACEMENT  09/2009   LAD 3.0 X23 mm Xience DES, RCA: 4.0 X 15 mm Xience DES  . ELECTROMAGNETIC NAVIGATION BROCHOSCOPY N/A 10/27/2017   Procedure: ELECTROMAGNETIC NAVIGATION BRONCHOSCOPY;  Surgeon: Flora Lipps, MD;  Location: [facility] ORS;  Service: Cardiopulmonary;  Laterality: N/A;  . ESOPHAGEAL DILATION    . ESOPHAGOGASTRODUODENOSCOPY  2008  . ESOPHAGOGASTRODUODENOSCOPY  08/20/2012  . HERNIA REPAIR  07/20/2012   L inguinal hernia repair  . PENILE PROSTHESIS IMPLANT      Family History  Problem Relation Age of Onset  . Heart attack Brother        Brother #1  . Diabetes Brother   . Hypertension Brother        #3  . Coronary artery disease Father 57  deceased  . Heart attack Father   . Diabetes Father   . Heart disease Father   . COPD Mother 94       deceased  . Alcohol abuse Sister        polysubstance abuse  . COPD Sister   . Lung cancer Sister   . Alcohol abuse Sister        polysubstance abuse  . Penile cancer Brother   . Diabetes Brother     Social History:  reports that he quit smoking about 14 months ago. His smoking use included cigarettes. He has a 42.00 pack-year smoking history. He has never used smokeless tobacco. He reports that he does not drink alcohol or use drugs.  He quit smoking 2019/02/12.  His wife died 2.5 years ago. He is retired by used to work in home improvement.  He lives in [place] with his dogs. The patient is alone today.  Allergies:  Allergies  Allergen Reactions  . Prozac [Fluoxetine Hcl] Shortness Of Breath  . Effexor Xr [Venlafaxine Hcl Er] Other (See Comments)    "Makes me feel funny"  . Wellbutrin [Bupropion] Other (See Comments)    "Makes me feel funny"    Current Medications: Current Outpatient Medications  Medication Sig Dispense Refill  . albuterol (PROVENTIL HFA;VENTOLIN HFA) 108  (90 Base) MCG/ACT inhaler Inhale 2 puffs into the lungs every 6 (six) hours as needed for wheezing or shortness of breath. 1 Inhaler 2  . aspirin 81 MG tablet Take 81 mg by mouth daily.      [name] atorvastatin (LIPITOR) 40 MG tablet Take 1 tablet (40 mg total) by mouth daily. 90 tablet 3  . azithromycin (ZITHROMAX) 250 MG tablet Take by mouth.    . Fluticasone-Salmeterol (ADVAIR) 500-50 MCG/DOSE AEPB Inhale 1 puff into the lungs every 12 (twelve) hours.    . furosemide (LASIX) 20 MG tablet Take 1 tablet (20 mg total) by mouth daily. 90 tablet 3  . furosemide (LASIX) 20 MG tablet Take 20 mg by mouth.    [name] guaiFENesin-dextromethorphan (ROBITUSSIN DM) 100-10 MG/5ML syrup Take 5 mLs by mouth every 4 (four) hours as needed for cough. 118 mL 0  . hydrOXYzine (VISTARIL) 25 MG capsule Take 25 mg by mouth 2 (two) times daily as needed for anxiety or itching.   0  . ipratropium-albuterol (DUONEB) 0.5-2.5 (3) MG/3ML SOLN USE 1 VIAL VIA NEBULIZER AND INHALE BY MOUTH 4 TIMES A DAY 1620 mL 1  . LORazepam (ATIVAN) 1 MG tablet Take 1 tablet (1 mg total) by mouth every 4 (four) hours while awake. 30 tablet 0  . losartan (COZAAR) 25 MG tablet Take 25 mg by mouth daily.    . mirtazapine (REMERON) 30 MG tablet Take 1 tablet (30 mg total) by mouth at bedtime. 30 tablet 5  . mirtazapine (REMERON) 7.5 MG tablet Take 7.5 mg by mouth at bedtime.    . Multiple Vitamin (MULTIVITAMIN) capsule Take 1 capsule by mouth daily.    . nitroGLYCERIN (NITROSTAT) 0.4 MG SL tablet Place 1 tablet (0.4 mg total) under the tongue every 5 (five) minutes as needed. 25 tablet 3  . OLANZapine (ZYPREXA) 2.5 MG tablet Take 2.5 mg by mouth at bedtime.    . OXYGEN Inhale 3 L into the lungs daily.     . risperiDONE (RISPERDAL) 0.5 MG tablet TAKE ONE TABLET BY MOUTH EVERY MORNING MAY INCREASE TO 2 TABS IN MORNING AFTER 1 WEEK IF NO IMPROVEMENT    . risperiDONE (RISPERDAL)  1 MG tablet Take by mouth.    . SPIRIVA HANDIHALER 18 MCG inhalation capsule  PLACE 1 CAPSULE IN HANDIHALER, PUNCTURE CAPSULE, THEN INHALE CONTENTS OF CAPSULE BY MOUTH ONCE DAILY     No current facility-administered medications for this visit.    Review of Systems  Constitutional: Positive for weight loss (5 lbs). Negative for chills, diaphoresis, fever and malaise/fatigue.       Doing good. Using portable oxygen tank.  HENT: Negative.  Negative for congestion, ear pain, hearing loss, nosebleeds, sinus pain and sore throat.   Eyes: Negative.  Negative for blurred vision, double vision and photophobia.  Respiratory: Positive for cough (non-productive, chronic), shortness of breath (chronic) and wheezing (wheezing spells). Negative for hemoptysis and sputum production.        2.5 liters of oxygen at home. 4 liters of oxygen with mask. COPD flare.  Cardiovascular: Negative.  Negative for chest pain, palpitations, orthopnea, leg swelling and PND.  Gastrointestinal: Negative.  Negative for abdominal pain, blood in stool, constipation, diarrhea, heartburn, melena, nausea and vomiting.  Genitourinary: Negative.  Negative for dysuria, frequency, hematuria and urgency.  Musculoskeletal: Negative.  Negative for back pain, joint pain and myalgias.  Skin: Negative.  Negative for itching and rash.  Neurological: Negative for dizziness, tingling, sensory change, speech change, focal weakness, weakness and headaches.  Endo/Heme/Allergies: Negative.  Does not bruise/bleed easily.  Psychiatric/Behavioral: Positive for depression. Negative for memory loss. The patient is nervous/anxious. The patient does not have insomnia.   All other systems reviewed and are negative.  Performance status (ECOG):  2  Vitals Blood pressure 126/68, pulse 99, temperature (!) 97 F (36.1 C), temperature source Tympanic, weight 179 lb 14.3 oz (81.6 kg), SpO2 99 %.   Physical Exam  Constitutional: He is oriented to person, place, and time. He appears well-developed and well-nourished. No distress.    HENT:  Head: Normocephalic and atraumatic.  Mouth/Throat: Oropharynx is clear and moist. No oropharyngeal exudate.  Dark hair.  Mustache.  [address] in place.  Mask.  Eyes: Pupils are equal, round, and reactive to light. Conjunctivae and EOM are normal. No scleral icterus.  Neck: No JVD present.  Cardiovascular: Normal rate, regular rhythm and normal heart sounds.  No murmur heard. Pulmonary/Chest: Effort normal and breath sounds normal. No accessory muscle usage. No respiratory distress. He has no wheezes. He has no rales. He exhibits no tenderness.  Abdominal: Soft. Bowel sounds are normal. He exhibits no distension and no mass. There is no abdominal tenderness. There is no rebound and no guarding.  Ventral hernia.  Musculoskeletal:        General: No edema. Normal range of motion.     Cervical back: Normal range of motion.  Lymphadenopathy:       Head (right side): No preauricular, no posterior auricular and no occipital adenopathy present.       Head (left side): No preauricular, no posterior auricular and no occipital adenopathy present.    He has no cervical adenopathy.    He has no axillary adenopathy.       Right: No supraclavicular adenopathy present.       Left: No supraclavicular adenopathy present.  Neurological: He is alert and oriented to person, place, and time.  Skin: Skin is warm and dry. No rash noted. He is not diaphoretic. No erythema. No pallor.  Tattoo "love" on his left knuckles.  Psychiatric: He has a normal mood and affect. His behavior is normal. Judgment and thought content normal.  Nursing  note and vitals reviewed.   No visits with results within 3 Day(s) from this visit.  Latest known visit with results is:  Admission on 04/30/2020, Discharged on 04/30/2020  Component Date Value Ref Range Status  . WBC 04/30/2020 5.8  4.0 - 10.5 K/uL Final  . RBC 04/30/2020 4.75  4.22 - 5.81 MIL/uL Final  . Hemoglobin 04/30/2020 13.9  13.0 - 17.0 g/dL Final  . HCT 04/30/2020  42.0  39.0 - 52.0 % Final  . MCV 04/30/2020 88.4  80.0 - 100.0 fL Final  . MCH 04/30/2020 29.3  26.0 - 34.0 pg Final  . MCHC 04/30/2020 33.1  30.0 - 36.0 g/dL Final  . RDW 04/30/2020 12.2  11.5 - 15.5 % Final  . Platelets 04/30/2020 233  150 - 400 K/uL Final  . nRBC 04/30/2020 0.0  0.0 - 0.2 % Final  . Neutrophils Relative % 04/30/2020 56  % Final  . Neutro Abs 04/30/2020 3.2  1.7 - 7.7 K/uL Final  . Lymphocytes Relative 04/30/2020 29  % Final  . Lymphs Abs 04/30/2020 1.7  0.7 - 4.0 K/uL Final  . Monocytes Relative 04/30/2020 11  % Final  . Monocytes Absolute 04/30/2020 0.6  0.1 - 1.0 K/uL Final  . Eosinophils Relative 04/30/2020 3  % Final  . Eosinophils Absolute 04/30/2020 0.2  0.0 - 0.5 K/uL Final  . Basophils Relative 04/30/2020 1  % Final  . Basophils Absolute 04/30/2020 0.0  0.0 - 0.1 K/uL Final  . Immature Granulocytes 04/30/2020 0  % Final  . Abs Immature Granulocytes 04/30/2020 0.02  0.00 - 0.07 K/uL Final   Performed at [facility], [address], [place], [place] [address]  . Sodium 04/30/2020 139  135 - 145 mmol/L Final  . Potassium 04/30/2020 3.2* 3.5 - 5.1 mmol/L Final  . Chloride 04/30/2020 98  98 - 111 mmol/L Final  . CO2 04/30/2020 32  22 - 32 mmol/L Final  . Glucose, Bld 04/30/2020 118* 70 - 99 mg/dL Final   Glucose reference range applies only to samples taken after fasting for at least 8 hours.  . BUN 04/30/2020 10  8 - 23 mg/dL Final  . Creatinine, Ser 04/30/2020 0.88  0.61 - 1.24 mg/dL Final  . Calcium 04/30/2020 9.7  8.9 - 10.3 mg/dL Final  . Total Protein 04/30/2020 7.0  6.5 - 8.1 g/dL Final  . Albumin 04/30/2020 4.2  3.5 - 5.0 g/dL Final  . AST 04/30/2020 19  15 - 41 U/L Final  . ALT 04/30/2020 23  0 - 44 U/L Final  . Alkaline Phosphatase 04/30/2020 65  38 - 126 U/L Final  . Total Bilirubin 04/30/2020 0.7  0.3 - 1.2 mg/dL Final  . GFR calc non Af Amer 04/30/2020 >60  >60 mL/min Final  . GFR calc Af Amer 04/30/2020 >60  >60 mL/min Final  .  Anion gap 04/30/2020 9  5 - 15 Final   Performed at [facility], [address], [place], [place] [address]  . Troponin I (High Sensitivity) 04/30/2020 8  <18 ng/L Final   Comment: (NOTE) Elevated high sensitivity troponin I (hsTnI) values and significant  changes across serial measurements may suggest ACS but many other  chronic and acute conditions are known to elevate hsTnI results.  Refer to the "Links" section for chest pain algorithms and additional  guidance. Performed at [facility], [address], [place], [place] [address]     Assessment:  Leslie Sims [facility] is a 69  y.o. male with bilateral pulmonary nodules s/p electromagnetic navigation bronchoscopy on 10/27/2017.  Pathology revealed alveolated lung tissue with hemosiderin laden macrophages, negative for atypia and malignancy from the RUL and LUL.  He has a 42 pack year smoking history.  Chest CT without contrast on 10/04/2017 revealed two new irregularly marginated nodules, 1.4 x 1.8 cm in the right upper lobe, and 1.3 x 1.7 cm in the mid left upper lobe worrisome for either synchronous lung carcinoma or metastatic involvement of the lungs. Two small nodules described on the prior dictated report had resolved in the left superior segment and most likely were postinflammatory. There was diffuse centrilobular emphysema and changes of prior granulomatous disease.  PET scan on 10/13/2017 revealed the left upper lobe nodule was FDG avid (SUV 5.8) consistent with malignancy. The new nodule in the right upper lobe demonstrated low level uptake (SUV 1.4), with the possibility of an inflammatory or infectious process and a low-grade malignancy could not be excluded. No distant metastatic disease was identified.  CT chest on 05/01/2019 revealed a stable left upper lobe nodule, likely benign. There was waxing/waning right lung opacities, suggesting infection, including atypical/mycobacterial infection.  Improvement of  the prior dominant masslike opacity argued against neoplasm.  Chest CT on 11/02/2019 revealed the vast majority of pulmonary nodules were stable. The largest of the previously noted pulmonary nodules in the right upper lobe had regressed significantly compared to the prior study, indicative of a resolving infectious/inflammatory process. There was diffuse bronchial wall thickening with moderate centrilobular and paraseptal emphysema.  Imaging findings were suggestive of underlying COPD.  There were bilateral adrenal adenomas, aortic atherosclerosis,left main and 3 vessel coronary artery disease.  There was low-attenuation in the myocardium throughout the mid to distal LAD territory compatible with fibrofatty metaplasia from prior LAD territory myocardial infarction(s).  Chest CT on 04/23/2020 revealed multiple bilateral stable pulmonary nodules. There was a new 7 mm nodule over the posterior left upper lobe. There was stable nodularity over an area of right upper lobe/apical scarring with new 9 mm nodular focus along the most inferior aspect of this scarring. The area had shown to wax and wane on previous exams and was most likely inflammatory.  There was a stable 2.6 cm left adrenal adenoma.  He received the Moderna vaccinations (01/26/2020, 02/23/2020).  Symptomatically,  He feels good. He recently completed steroids for COPD flare. His wheezing has improved. He has a nonproductive cough and shortness of breath.  Plan: 1.   Bilateral pulmonary nodules             Review chest CT from 04/23/2020.  Images personally reviewed.  Agree with radiology interpretation.   Most nodules are stable except for a new 7 mm posterior LUL and 9 mm nodular focus in the RUL/apical scarring.  Discuss plan for follow-up chest CT in 3 months.  Lung nodules are likely inflammatory/infectious.  He previously had a negative biopsy by [facility].             Review interval note by Dr Patsey Berthold on 04/21/2020.   He has a follow-up  in 2 months. 2.   Chest CT on 05/02/2020. 3.   RTC after chest CT for MD assessment and review of imaging.  I discussed the assessment and treatment plan with the patient.  The patient was provided an opportunity to ask questions and all were answered.  The patient agreed with the plan and demonstrated an understanding of the instructions.  The patient was advised to call  back if the symptoms worsen or if the condition fails to improve as anticipated.   Lequita Asal, MD, PhD    05/06/2020, 11:41 AM  I, Selena Batten, am acting as a scribe for Lequita Asal, MD.  I, [place] Mike Gip, MD, have reviewed the above documentation for accuracy and completeness, and I agree with the above.

## 2020-05-06 ENCOUNTER — Inpatient Hospital Stay: Payer: Medicare Other | Attending: Hematology and Oncology | Admitting: Hematology and Oncology

## 2020-05-06 ENCOUNTER — Encounter: Payer: Self-pay | Admitting: Hematology and Oncology

## 2020-05-06 VITALS — BP 126/68 | HR 99 | Temp 97.0°F | Wt 179.9 lb

## 2020-05-06 DIAGNOSIS — F329 Major depressive disorder, single episode, unspecified: Secondary | ICD-10-CM | POA: Insufficient documentation

## 2020-05-06 DIAGNOSIS — Z9981 Dependence on supplemental oxygen: Secondary | ICD-10-CM | POA: Insufficient documentation

## 2020-05-06 DIAGNOSIS — I25118 Atherosclerotic heart disease of native coronary artery with other forms of angina pectoris: Secondary | ICD-10-CM | POA: Diagnosis not present

## 2020-05-06 DIAGNOSIS — Z801 Family history of malignant neoplasm of trachea, bronchus and lung: Secondary | ICD-10-CM | POA: Diagnosis not present

## 2020-05-06 DIAGNOSIS — R0609 Other forms of dyspnea: Secondary | ICD-10-CM | POA: Insufficient documentation

## 2020-05-06 DIAGNOSIS — R918 Other nonspecific abnormal finding of lung field: Secondary | ICD-10-CM | POA: Diagnosis not present

## 2020-05-06 DIAGNOSIS — J449 Chronic obstructive pulmonary disease, unspecified: Secondary | ICD-10-CM | POA: Insufficient documentation

## 2020-05-06 DIAGNOSIS — Z87891 Personal history of nicotine dependence: Secondary | ICD-10-CM | POA: Insufficient documentation

## 2020-05-06 DIAGNOSIS — Z86010 Personal history of colonic polyps: Secondary | ICD-10-CM | POA: Diagnosis not present

## 2020-05-06 DIAGNOSIS — K219 Gastro-esophageal reflux disease without esophagitis: Secondary | ICD-10-CM | POA: Insufficient documentation

## 2020-05-06 DIAGNOSIS — F419 Anxiety disorder, unspecified: Secondary | ICD-10-CM | POA: Insufficient documentation

## 2020-05-13 ENCOUNTER — Ambulatory Visit: Payer: Medicare Other | Admitting: Urology

## 2020-05-20 ENCOUNTER — Encounter: Payer: Self-pay | Admitting: Urology

## 2020-05-20 ENCOUNTER — Ambulatory Visit (INDEPENDENT_AMBULATORY_CARE_PROVIDER_SITE_OTHER): Payer: Medicare Other | Admitting: Urology

## 2020-05-20 ENCOUNTER — Other Ambulatory Visit: Payer: Self-pay

## 2020-05-20 VITALS — BP 126/87 | HR 112 | Ht 70.0 in | Wt 180.0 lb

## 2020-05-20 DIAGNOSIS — R972 Elevated prostate specific antigen [PSA]: Secondary | ICD-10-CM | POA: Diagnosis not present

## 2020-05-20 DIAGNOSIS — D509 Iron deficiency anemia, unspecified: Secondary | ICD-10-CM | POA: Insufficient documentation

## 2020-05-20 NOTE — Patient Instructions (Signed)
Prostate MRI Prep:  1- No ejaculation 48 hours prior to exam  2- No food or drink or caffeine 4 hours prior to exam  3- Fleets enema needs to be done 4 hours prior to exam   4- Urinate just prior to exam   Prostate-Specific Antigen Test Why am I having this test? The prostate-specific antigen (PSA) test is a screening test for prostate cancer. It can identify early signs of prostate cancer, which may allow for more effective treatment. Your health care provider may recommend that you have a PSA test starting at age 40 or that you have one earlier or later, depending on your risk factors for prostate cancer. You may also have a PSA test:  To monitor treatment of prostate cancer.  To check whether prostate cancer has returned after treatment.  If you have signs of other conditions that can affect PSA levels, such as: ? An enlarged prostate that is not caused by cancer (benign prostatic hyperplasia, BPH). This condition is very common in older men. ? A prostate infection. What is being tested? This test measures the amount of PSA in your blood. PSA is a protein that is made in the prostate. The prostate naturally produces more PSA as you age, but very high levels may be a sign of a medical condition. What kind of sample is taken?  A blood sample is required for this test. It is usually collected by inserting a needle into a blood vessel or by sticking a finger with a small needle. Blood for this test should be drawn before having an exam of the prostate. How do I prepare for this test? Do not ejaculate starting 24 hours before your test, or as long as told by your health care provider. Tell a health care provider about:  Any allergies you have.  All medicines you are taking, including vitamins, herbs, eye drops, creams, and over-the-counter medicines. This also includes: ? Medicines to assist with hair growth, such as finasteride. ? Any recent exposure to a medicine called  diethylstilbestrol.  Any blood disorders you have.  Any recent procedures you have had, especially any procedures involving the prostate or rectum.  Any medical conditions you have.  Any recent urinary tract infections (UTIs) you have had. How are the results reported? Your test results will be reported as a value that indicates how much PSA is in your blood. This will be given as nanograms of PSA per milliliter of blood (ng/mL). Your health care provider will compare your results to normal ranges that were established after testing a large group of people (reference ranges). Reference ranges may vary among labs and hospitals. PSA levels vary from person to person and generally increase with age. Because of this variation, there is no single PSA value that is considered normal for everyone. Instead, PSA reference ranges are used to describe whether your PSA levels are considered low or high (elevated). Common reference ranges are:  Low: 0-2.5 ng/mL.  Slightly to moderately elevated: 2.6-10.0 ng/mL.  Moderately elevated: 10.0-19.9 ng/mL.  Significantly elevated: 20 ng/mL or greater. Sometimes, the test results may report that a condition is present when it is not present (false-positive result). What do the results mean? A test result that is higher than 4 ng/mL may mean that you are at an increased risk for prostate cancer. However, a PSA test by itself is not enough to diagnose prostate cancer. High PSA levels may also be caused by the natural aging process, prostate infection, or  BPH. PSA screening cannot tell you if your PSA is high due to cancer or a different cause. A prostate biopsy is the only way to diagnose prostate cancer. A risk of having the PSA test is diagnosing and treating prostate cancer that would never have caused any symptoms or problems (overdiagnosis and overtreatment). Talk with your health care provider about what your results mean. Questions to ask your health care  provider Ask your health care provider, or the department that is doing the test:  When will my results be ready?  How will I get my results?  What are my treatment options?  What other tests do I need?  What are my next steps? Summary  The prostate-specific antigen (PSA) test is a screening test for prostate cancer.  Your health care provider may recommend that you have a PSA test starting at age 67 or that you have one earlier or later, depending on your risk factors for prostate cancer.  A test result that is higher than 4 ng/mL may mean that you are at an increased risk for prostate cancer. However, elevated levels can be caused by a number of conditions other than prostate cancer.  Talk with your health care provider about what your results mean. This information is not intended to replace advice given to you by your health care provider. Make sure you discuss any questions you have with your health care provider. Document Revised: 11/18/2017 Document Reviewed: 09/12/2017 Elsevier Patient Education  2020 [place].   Prostate Cancer Screening  Prostate cancer screening is a test that is done to check for the presence of prostate cancer in men. The prostate gland is a walnut-sized gland that is located below the bladder and in front of the rectum in males. The function of the prostate is to add fluid to semen during ejaculation. Prostate cancer is the second most common type of cancer in men. Who should have prostate cancer screening?  Screening recommendations vary based on age and other risk factors. Screening is recommended if: You are older than age 87. If you are age 65-69, talk with your health care provider about your need for screening and how often screening should be done. Because most prostate cancers are slow growing and will not cause death, screening is generally reserved in this age group for men who have a 10-15-year life expectancy. You are younger than age  65, and you have these risk factors: Being a black male or a male of African descent. Having a father, brother, or uncle who has been diagnosed with prostate cancer. The risk is higher if your family member's cancer occurred at an early age. Screening is not recommended if: You are younger than age 97. You are between the ages of 61 and 23 and you have no risk factors. You are 74 years of age or older. At this age, the risks that screening can cause are greater than the benefits that it may provide. If you are at high risk for prostate cancer, your health care provider may recommend that you have screenings more often or that you start screening at a younger age. How is screening for prostate cancer done? The recommended prostate cancer screening test is a blood test called the prostate-specific antigen (PSA) test. PSA is a protein that is made in the prostate. As you age, your prostate naturally produces more PSA. Abnormally high PSA levels may be caused by: Prostate cancer. An enlarged prostate that is not caused by cancer (benign  prostatic hyperplasia, BPH). This condition is very common in older men. A prostate gland infection (prostatitis). Depending on the PSA results, you may need more tests, such as: A physical exam to check the size of your prostate gland. Blood and imaging tests. A procedure to remove tissue samples from your prostate gland for testing (biopsy). What are the benefits of prostate cancer screening? Screening can help to identify cancer at an early stage, before symptoms start and when the cancer can be treated more easily. There is a small chance that screening may lower your risk of dying from prostate cancer. The chance is small because prostate cancer is a slow-growing cancer, and most men with prostate cancer die from a different cause. What are the risks of prostate cancer screening? The main risk of prostate cancer screening is diagnosing and treating prostate  cancer that would never have caused any symptoms or problems. This is called overdiagnosisand overtreatment. PSA screening cannot tell you if your PSA is high due to cancer or a different cause. A prostate biopsy is the only procedure to diagnose prostate cancer. Even the results of a biopsy may not tell you if your cancer needs to be treated. Slow-growing prostate cancer may not need any treatment other than monitoring, so diagnosing and treating it may cause unnecessary stress or other side effects. A prostate biopsy may also cause: Infection or fever. A false negative. This is a result that shows that you do not have prostate cancer when you actually do have prostate cancer. Questions to ask your health care provider When should I start prostate cancer screening? What is my risk for prostate cancer? How often do I need screening? What type of screening tests do I need? How do I get my test results? What do my results mean? Do I need treatment? Where to find more information The American Cancer Society: www.cancer.org American Urological Association: www.auanet.org Contact a health care provider if: You have difficulty urinating. You have pain when you urinate or ejaculate. You have blood in your urine or semen. You have pain in your back or in the area of your prostate. Summary Prostate cancer is a common type of cancer in men. The prostate gland is located below the bladder and in front of the rectum. This gland adds fluid to semen during ejaculation. Prostate cancer screening may identify cancer at an early stage, when the cancer can be treated more easily. The prostate-specific antigen (PSA) test is the recommended screening test for prostate cancer. Discuss the risks and benefits of prostate cancer screening with your health care provider. If you are age 37 or older, the risks that screening can cause are greater than the benefits that it may provide. This information is not intended  to replace advice given to you by your health care provider. Make sure you discuss any questions you have with your health care provider. Document Revised: 07/19/2019 Document Reviewed: 07/19/2019 Elsevier Patient Education  [place].

## 2020-05-20 NOTE — Progress Notes (Signed)
05/20/20 1:10 PM   Leslie Sims [facility] [birth year] [id number]  CC: Elevated PSA  HPI: I saw Leslie Sims in urology clinic today for evaluation of mildly elevated PSA.  Is a co-morbid 69 year old male with history notable for CAD, COPD on oxygen, and history of penile prosthesis who has had multiple mildly elevated PSAs over the last year.  Baseline PSA has ranged from 2-3.  PSA in October 2020 was 4.49, 3.43 in December 2020, 4.08 in February 2021, and 4.3 in May 2021.  He denies any family history of prostate or breast cancer.  No prior history of prostate biopsy.  No prior cross-sectional imaging to review prostate volume.  He denies any gross hematuria or urinary symptoms.  He has a history of a penile prosthesis placed over 10 years ago by Leslie Sims, but this is no longer functional   PMH: Past Medical History:  Diagnosis Date  . Allergic rhinitis   . Anxiety   . Back pain   . Colon polyps 08/2012   colonoscopy; multiple colon polyps; repeat colonoscopy in one year.  [name] COPD (chronic obstructive pulmonary disease) ([place])    a. on home O2.  . Coronary artery disease 11/2009   a. late presenting ant MI; b. LHC 100% pLAD s/p PCI/DES, 99% mRCA s/p PCI/DES, EF 35%; c. nuclear stress test 05/13: prior ant/inf infarcts w/o ischemia, EF 43%; d. LHC 02/14: widely patent stents with no other obs dz, EF 40%; e. LHC 11/17: LM nl, mLAD 10%, patent LAD stent, dLAD 20%, p-mRCA 10%, mRCA 40%, patent RCA stent, EF 35-45%   . Depression   . GERD (gastroesophageal reflux disease)   . Headache(784.0)   . Helicobacter pylori (H. pylori)   . Hemorrhoids   . Hernia    inguinal  . HFrEF (heart failure with reduced ejection fraction) ([place])    a. 10/2016 LV gram: EF 35-45%; b. 06/2018 Echo: EF 40-45%.  . Hyperlipidemia   . Hypertension   . Insomnia   . Ischemic cardiomyopathy    a. 10/2016 LV gram: EF 35-45%; b. 06/2018 Echo: EF 40-45%.  . Myalgia   . Peptic ulcer   . Pulmonary nodules   . ST  elevation (STEMI) myocardial infarction involving left anterior descending coronary artery ([place]) 11/2009   a. s/p PCI to the LAD  . Status post dilation of esophageal narrowing 2000  . Tinea pedis   . Wears dentures    partial upper    Surgical History: Past Surgical History:  Procedure Laterality Date  . Admission  12/20/2012   COPD exacerbation.  [place].  [name] CARDIAC CATHETERIZATION    . CARDIAC CATHETERIZATION  02/05/13   [facility]  . CARDIAC CATHETERIZATION  10/14   [facility] : patent stents with no change in anatomy. EF: 40$  . CARDIAC CATHETERIZATION Left 11/12/2016   Procedure: Left Heart Cath and Coronary Angiography;  Surgeon: Wellington Hampshire, MD;  Location: [facility];  Service: Cardiovascular;  Laterality: Left;  . COLONOSCOPY    . COLONOSCOPY WITH PROPOFOL N/A 05/18/2016   Procedure: COLONOSCOPY WITH PROPOFOL;  Surgeon: Lucilla Lame, MD;  Location: [facility] ENDOSCOPY;  Service: Endoscopy;  Laterality: N/A;  . COLONOSCOPY WITH PROPOFOL N/A 03/19/2020   Procedure: COLONOSCOPY WITH PROPOFOL;  Surgeon: Lin Landsman, MD;  Location: [facility] ENDOSCOPY;  Service: Gastroenterology;  Laterality: N/A;  . CORONARY ANGIOPLASTY WITH STENT PLACEMENT  09/2009   LAD 3.0 X23 mm Xience DES, RCA: 4.0 X 15 mm Xience DES  . ELECTROMAGNETIC  NAVIGATION BROCHOSCOPY N/A 10/27/2017   Procedure: ELECTROMAGNETIC NAVIGATION BRONCHOSCOPY;  Surgeon: Flora Lipps, MD;  Location: [facility] ORS;  Service: Cardiopulmonary;  Laterality: N/A;  . ESOPHAGEAL DILATION    . ESOPHAGOGASTRODUODENOSCOPY  2008  . ESOPHAGOGASTRODUODENOSCOPY  08/20/2012  . HERNIA REPAIR  07/20/2012   L inguinal hernia repair  . PENILE PROSTHESIS IMPLANT      Family History: Family History  Problem Relation Age of Onset  . Heart attack Brother        Brother #1  . Diabetes Brother   . Hypertension Brother        #3  . Coronary artery disease Father 45       deceased  . Heart attack Father   . Diabetes Father   . Heart disease Father   .  COPD Mother 26       deceased  . Alcohol abuse Sister        polysubstance abuse  . COPD Sister   . Lung cancer Sister   . Alcohol abuse Sister        polysubstance abuse  . Penile cancer Brother   . Diabetes Brother     Social History:  reports that he quit smoking about 15 months ago. His smoking use included cigarettes. He has a 42.00 pack-year smoking history. He has never used smokeless tobacco. He reports that he does not drink alcohol or use drugs.  Physical Exam: BP 126/87   Pulse (!) 112   Ht 5\' 10"  (1.778 m)   Wt 180 lb (81.6 kg)   BMI 25.83 kg/m    Constitutional: Frail-appearing, on oxygen Cardiovascular: No clubbing, cyanosis, or edema. GI: Abdomen is soft, nontender, nondistended, no abdominal masses DRE: 50 g, smooth, prominent sulcus but no nodules or masses  Laboratory Data: PSA history reviewed, see HPI  Assessment & Plan:   In summary, is a very comorbid 69 year old male with COPD on oxygen and significant cardiac history who has had multiple mildly elevated PSAs over the last year with highest PSA of 4.5.  We reviewed the implications of an elevated PSA and the uncertainty surrounding it. In general, a man's PSA increases with age and is produced by both normal and cancerous prostate tissue. The differential diagnosis for elevated PSA includes BPH, prostate cancer, infection, recent intercourse/ejaculation, recent urethroscopic manipulation (foley placement/cystoscopy) or trauma, and prostatitis.   Management of an elevated PSA can include observation or prostate biopsy and we discussed this in detail. Our goal is to detect clinically significant prostate cancers, and manage with either active surveillance, surgery, or radiation for localized disease. Risks of prostate biopsy include bleeding, infection (including life threatening sepsis), pain, and lower urinary symptoms. Hematuria, hematospermia, and blood in the stool are all common after biopsy and can  persist up to 4 weeks.    Patient opts for prostate MRI for mildly elevated PSA, will call with results Pursue biopsy if any suspicious areas seen on prostate MRI  Nickolas Madrid, MD 05/20/2020  [facility] [address], [place], [place] [address] [phone]

## 2020-05-25 ENCOUNTER — Encounter: Payer: Self-pay | Admitting: Emergency Medicine

## 2020-05-25 ENCOUNTER — Other Ambulatory Visit: Payer: Self-pay

## 2020-05-25 ENCOUNTER — Emergency Department
Admission: EM | Admit: 2020-05-25 | Discharge: 2020-05-25 | Disposition: A | Discharge status: Home / Self Care | Payer: Medicare Other | Attending: Emergency Medicine | Admitting: Emergency Medicine

## 2020-05-25 ENCOUNTER — Emergency Department: Payer: Medicare Other

## 2020-05-25 DIAGNOSIS — D125 Benign neoplasm of sigmoid colon: Secondary | ICD-10-CM | POA: Insufficient documentation

## 2020-05-25 DIAGNOSIS — I5022 Chronic systolic (congestive) heart failure: Secondary | ICD-10-CM | POA: Diagnosis not present

## 2020-05-25 DIAGNOSIS — D122 Benign neoplasm of ascending colon: Secondary | ICD-10-CM | POA: Diagnosis not present

## 2020-05-25 DIAGNOSIS — I2510 Atherosclerotic heart disease of native coronary artery without angina pectoris: Secondary | ICD-10-CM | POA: Diagnosis not present

## 2020-05-25 DIAGNOSIS — I110 Hypertensive heart disease with heart failure: Secondary | ICD-10-CM | POA: Insufficient documentation

## 2020-05-25 DIAGNOSIS — R0789 Other chest pain: Secondary | ICD-10-CM | POA: Insufficient documentation

## 2020-05-25 DIAGNOSIS — Z7982 Long term (current) use of aspirin: Secondary | ICD-10-CM | POA: Insufficient documentation

## 2020-05-25 DIAGNOSIS — R0602 Shortness of breath: Secondary | ICD-10-CM | POA: Insufficient documentation

## 2020-05-25 DIAGNOSIS — J449 Chronic obstructive pulmonary disease, unspecified: Secondary | ICD-10-CM | POA: Diagnosis not present

## 2020-05-25 DIAGNOSIS — Z87891 Personal history of nicotine dependence: Secondary | ICD-10-CM | POA: Diagnosis not present

## 2020-05-25 DIAGNOSIS — Z955 Presence of coronary angioplasty implant and graft: Secondary | ICD-10-CM | POA: Diagnosis not present

## 2020-05-25 DIAGNOSIS — D124 Benign neoplasm of descending colon: Secondary | ICD-10-CM | POA: Diagnosis not present

## 2020-05-25 DIAGNOSIS — R079 Chest pain, unspecified: Secondary | ICD-10-CM

## 2020-05-25 DIAGNOSIS — Z79899 Other long term (current) drug therapy: Secondary | ICD-10-CM | POA: Insufficient documentation

## 2020-05-25 LAB — COMPREHENSIVE METABOLIC PANEL
ALT: 19 U/L (ref 0–44)
AST: 18 U/L (ref 15–41)
Albumin: 3.9 g/dL (ref 3.5–5.0)
Alkaline Phosphatase: 59 U/L (ref 38–126)
Anion gap: 7 (ref 5–15)
BUN: 11 mg/dL (ref 8–23)
CO2: 30 mmol/L (ref 22–32)
Calcium: 8.8 mg/dL — ABNORMAL LOW (ref 8.9–10.3)
Chloride: 104 mmol/L (ref 98–111)
Creatinine, Ser: 0.9 mg/dL (ref 0.61–1.24)
GFR calc Af Amer: >60 mL/min (ref >60)
GFR calc non Af Amer: >60 mL/min (ref >60)
Glucose, Bld: 106 mg/dL — ABNORMAL HIGH (ref 70–99)
Potassium: 3.9 mmol/L (ref 3.5–5.1)
Sodium: 141 mmol/L (ref 135–145)
Total Bilirubin: 0.8 mg/dL (ref 0.3–1.2)
Total Protein: 6.5 g/dL (ref 6.5–8.1)

## 2020-05-25 LAB — TROPONIN I (HIGH SENSITIVITY)
Troponin I (High Sensitivity): 7 ng/L (ref <18)
Troponin I (High Sensitivity): 8 ng/L (ref <18)

## 2020-05-25 LAB — CBC
HCT: 41.3 % (ref 39.0–52.0)
Hemoglobin: 13.1 g/dL (ref 13.0–17.0)
MCH: 28.7 pg (ref 26.0–34.0)
MCHC: 31.7 g/dL (ref 30.0–36.0)
MCV: 90.6 fL (ref 80.0–100.0)
Platelets: 246 10*3/uL (ref 150–400)
RBC: 4.56 MIL/uL (ref 4.22–5.81)
RDW: 12.2 % (ref 11.5–15.5)
WBC: 5.6 10*3/uL (ref 4.0–10.5)
nRBC: 0 % (ref 0.0–0.2)

## 2020-05-25 LAB — LIPASE, BLOOD: Lipase: 27 U/L (ref 11–51)

## 2020-05-25 IMAGING — DX DG CHEST 1V PORT
1 series · 1 of 1 positions shown · non-contrast
Comparison: Portable exam [9J] hours compared to [DATE]

CLINICAL DATA: Chest pain, intermittent LEFT side chest pain now
substernal, history COPD, coronary artery disease post STEMI,
hypertension, former smoker

EXAM:
PORTABLE CHEST 1 VIEW

[chest ap]
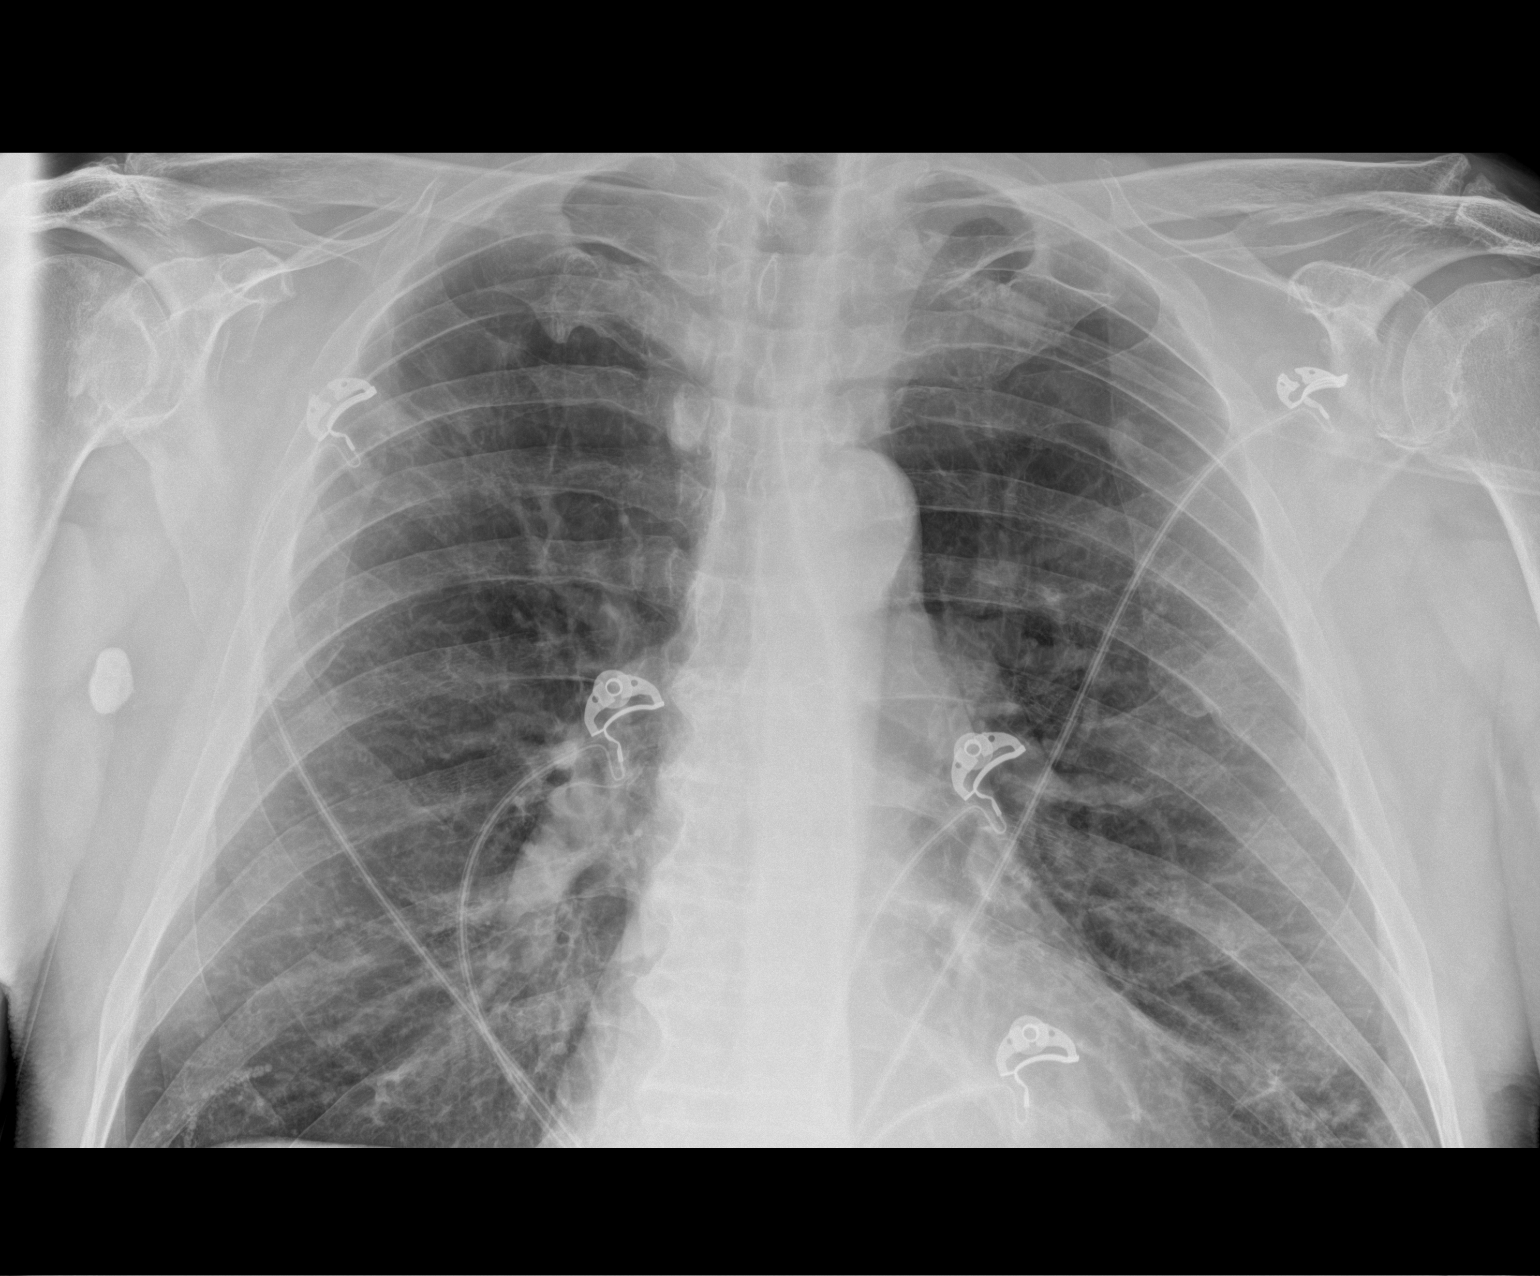

[1 of 1 positions shown; findings below may reference images not displayed]

FINDINGS: Normal heart size, mediastinal contours, and pulmonary vascularity.

Mild BILATERAL upper lobe scarring.

Persistent nodular density LEFT upper lobe, 10 mm diameter,
unchanged, likely calcified.

No acute infiltrate, pleural effusion or pneumothorax.

Osseous structures unremarkable.
IMPRESSION: No acute abnormalities.

## 2020-05-25 NOTE — ED Provider Notes (Signed)
[facility] Emergency Department Provider Note  Time seen: 9:40 AM  I have reviewed the triage vital signs and the nursing notes.   HISTORY  Chief Complaint Chest Pain   HPI Leslie Sims is a 69 y.o. male with a past medical history anxiety, COPD, CAD with 2 stents last of which was 10 years ago, gastric reflux, hypertension, hyperlipidemia, presents to the emergency department for chest pain.  According to the patient for the past 3 days he has been experiencing intermittent left-sided chest pain, this morning it moved into the central chest.  States some shortness of breath.  No significant nausea or diaphoresis.  Describes the pain as mild aching pain but denies any pain currently.   Past Medical History:  Diagnosis Date  . Allergic rhinitis   . Anxiety   . Back pain   . Colon polyps 08/2012   colonoscopy; multiple colon polyps; repeat colonoscopy in one year.  [name] COPD (chronic obstructive pulmonary disease) ([place])    a. on home O2.  . Coronary artery disease 11/2009   a. late presenting ant MI; b. LHC 100% pLAD s/p PCI/DES, 99% mRCA s/p PCI/DES, EF 35%; c. nuclear stress test 05/13: prior ant/inf infarcts w/o ischemia, EF 43%; d. LHC 02/14: widely patent stents with no other obs dz, EF 40%; e. LHC 11/17: LM nl, mLAD 10%, patent LAD stent, dLAD 20%, p-mRCA 10%, mRCA 40%, patent RCA stent, EF 35-45%   . Depression   . GERD (gastroesophageal reflux disease)   . Headache(784.0)   . Helicobacter pylori (H. pylori)   . Hemorrhoids   . Hernia    inguinal  . HFrEF (heart failure with reduced ejection fraction) ([place])    a. 10/2016 LV gram: EF 35-45%; b. 06/2018 Echo: EF 40-45%.  . Hyperlipidemia   . Hypertension   . Insomnia   . Ischemic cardiomyopathy    a. 10/2016 LV gram: EF 35-45%; b. 06/2018 Echo: EF 40-45%.  . Myalgia   . Peptic ulcer   . Pulmonary nodules   . ST elevation (STEMI) myocardial infarction involving left anterior descending coronary  artery ([place]) 11/2009   a. s/p PCI to the LAD  . Status post dilation of esophageal narrowing 2000  . Tinea pedis   . Wears dentures    partial upper    Patient Active Problem List   Diagnosis Date Noted  . Goals of care, counseling/discussion   . Palliative care by specialist   . Healthcare-associated pneumonia 01/28/2019  . COPD with acute exacerbation ([place]) 12/31/2018  . Acute respiratory failure with hypoxia ([place]) 09/29/2018  . Acute respiratory failure with hypoxia and hypercapnia ([place]) 09/13/2018  . Acute on chronic respiratory failure with hypoxia and hypercapnia ([place]) 08/24/2018  . Chest pain 07/02/2018  . Grief reaction 01/13/2018  . COPD with hypoxia ([place]) 12/25/2017  . Unstable angina ([place])   . Benign neoplasm of ascending colon   . Benign neoplasm of descending colon   . Benign neoplasm of sigmoid colon   . History of colonic polyps   . Pulmonary nodules/lesions, multiple 01/05/2015  . Bruit of left carotid artery 11/04/2014  . Sleep disorder 07/18/2013  . Seasonal and perennial allergic rhinitis 05/29/2013  . Bradycardia 04/20/2011  . SMOKER 07/30/2010  . CAD, NATIVE VESSEL 04/23/2010  . Hyperlipidemia 03/24/2010  . DEPRESSION/ANXIETY 03/24/2010  . Chronic systolic heart failure ([place]) 03/24/2010  . COPD, very severe ([place]) 03/24/2010  . GERD 03/24/2010  . Essential hypertension 11/21/2009  Past Surgical History:  Procedure Laterality Date  . Admission  12/20/2012   COPD exacerbation.  [place].  [name] CARDIAC CATHETERIZATION    . CARDIAC CATHETERIZATION  02/05/13   [facility]  . CARDIAC CATHETERIZATION  10/14   [facility] : patent stents with no change in anatomy. EF: 40$  . CARDIAC CATHETERIZATION Left 11/12/2016   Procedure: Left Heart Cath and Coronary Angiography;  Surgeon: Wellington Hampshire, MD;  Location: [place] CV LAB;  Service: Cardiovascular;  Laterality: Left;  . COLONOSCOPY    . COLONOSCOPY WITH PROPOFOL N/A 05/18/2016   Procedure: COLONOSCOPY WITH PROPOFOL;   Surgeon: Lucilla Lame, MD;  Location: [facility] ENDOSCOPY;  Service: Endoscopy;  Laterality: N/A;  . COLONOSCOPY WITH PROPOFOL N/A 03/19/2020   Procedure: COLONOSCOPY WITH PROPOFOL;  Surgeon: Lin Landsman, MD;  Location: [facility] ENDOSCOPY;  Service: Gastroenterology;  Laterality: N/A;  . CORONARY ANGIOPLASTY WITH STENT PLACEMENT  09/2009   LAD 3.0 X23 mm Xience DES, RCA: 4.0 X 15 mm Xience DES  . ELECTROMAGNETIC NAVIGATION BROCHOSCOPY N/A 10/27/2017   Procedure: ELECTROMAGNETIC NAVIGATION BRONCHOSCOPY;  Surgeon: Flora Lipps, MD;  Location: [facility] ORS;  Service: Cardiopulmonary;  Laterality: N/A;  . ESOPHAGEAL DILATION    . ESOPHAGOGASTRODUODENOSCOPY  2008  . ESOPHAGOGASTRODUODENOSCOPY  08/20/2012  . HERNIA REPAIR  07/20/2012   L inguinal hernia repair  . PENILE PROSTHESIS IMPLANT      Prior to Admission medications   Medication Sig Start Date End Date Taking? Authorizing Provider  albuterol (PROVENTIL HFA;VENTOLIN HFA) 108 (90 Base) MCG/ACT inhaler Inhale 2 puffs into the lungs every 6 (six) hours as needed for wheezing or shortness of breath. 11/01/18   Rudene Re, MD  aspirin 81 MG tablet Take 81 mg by mouth daily.      [provider]  atorvastatin (LIPITOR) 40 MG tablet Take 1 tablet (40 mg total) by mouth daily. 03/03/18   [name], MD  Fluticasone-Salmeterol (ADVAIR) 500-50 MCG/DOSE AEPB Inhale 1 puff into the lungs every 12 (twelve) hours. 11/29/18   [provider]  furosemide (LASIX) 20 MG tablet Take 20 mg by mouth.    [provider]  hydrOXYzine (VISTARIL) 25 MG capsule Take 25 mg by mouth 2 (two) times daily as needed for anxiety or itching.  06/05/18   [provider]  ipratropium-albuterol (DUONEB) 0.5-2.5 (3) MG/3ML SOLN USE 1 VIAL VIA NEBULIZER AND INHALE BY MOUTH 4 TIMES A DAY 01/21/20   [name], MD  LORazepam (ATIVAN) 1 MG tablet Take 1 tablet (1 mg total) by mouth every 4 (four) hours while awake. 02/14/19   [name],  MD  losartan (COZAAR) 25 MG tablet Take 25 mg by mouth daily. 02/23/19   [provider]  Multiple Vitamin (MULTIVITAMIN) capsule Take 1 capsule by mouth daily.    [provider]  nitroGLYCERIN (NITROSTAT) 0.4 MG SL tablet Place 1 tablet (0.4 mg total) under the tongue every 5 (five) minutes as needed. 10/17/17   Wellington Hampshire, MD  OLANZapine (ZYPREXA) 2.5 MG tablet Take 2.5 mg by mouth at bedtime. 01/19/19   [provider]  OXYGEN Inhale 3 L into the lungs daily.     [provider]  risperiDONE (RISPERDAL) 1 MG tablet Take by mouth.    [provider]  sertraline (ZOLOFT) 50 MG tablet TAKE ONE TABLET BY MOUTH EVERY MORNING FOR 7 DAYS. THEN INCREASE TWO TABLETS EVERY MORNING 05/07/20   [provider]  SPIRIVA HANDIHALER 18 MCG inhalation capsule PLACE 1  CAPSULE IN HANDIHALER, PUNCTURE CAPSULE, THEN INHALE CONTENTS OF CAPSULE BY MOUTH ONCE DAILY 04/22/20   [provider]    Allergies  Allergen Reactions  . Prozac [Fluoxetine Hcl] Shortness Of Breath  . Effexor Xr [Venlafaxine Hcl Er] Other (See Comments)    "Makes me feel funny"  . Wellbutrin [Bupropion] Other (See Comments)    "Makes me feel funny"    Family History  Problem Relation Age of Onset  . Heart attack Brother        Brother #1  . Diabetes Brother   . Hypertension Brother        #3  . Coronary artery disease Father 83       deceased  . Heart attack Father   . Diabetes Father   . Heart disease Father   . COPD Mother 72       deceased  . Alcohol abuse Sister        polysubstance abuse  . COPD Sister   . Lung cancer Sister   . Alcohol abuse Sister        polysubstance abuse  . Penile cancer Brother   . Diabetes Brother   . Prostate cancer Neg Hx   . Bladder Cancer Neg Hx   . Kidney cancer Neg Hx     Social History Social History   Tobacco Use  . Smoking status: Former Smoker    Packs/day: 1.00    Years: 42.00    Pack years: 42.00    Types:  Cigarettes    Quit date: 02/10/2019    Years since quitting: 1.2  . Smokeless tobacco: Never Used  Substance Use Topics  . Alcohol use: No    Alcohol/week: 0.0 standard drinks  . Drug use: No    Review of Systems Constitutional: Negative for fever. Cardiovascular: Central chest discomfort, now resolved Respiratory: Mild shortness of breath, now resolved.  Patient wears 2.5 L of oxygen 24/7 for COPD. Gastrointestinal: Negative for abdominal pain, vomiting  Genitourinary: Negative for urinary compaints Musculoskeletal: Negative for musculoskeletal complaints Neurological: Negative for headache All other ROS negative  ____________________________________________   PHYSICAL EXAM:  VITAL SIGNS: ED Triage Vitals  Enc Vitals Group     BP --      Pulse --      Resp --      Temp --      Temp src --      SpO2 --      Weight 05/25/20 0938 180 lb (81.6 kg)     Height 05/25/20 0938 5\' 10"  (1.778 m)     Head Circumference --      Peak Flow --      Pain Score 05/25/20 0937 0     Pain Loc --      Pain Edu? --      Excl. in [place]? --    Constitutional: Alert and oriented. Well appearing and in no distress. Eyes: Normal exam ENT      Head: Normocephalic and atraumatic.      Mouth/Throat: Mucous membranes are moist. Cardiovascular: Normal rate, regular rhythm.  Respiratory: Normal respiratory effort without tachypnea nor retractions. Breath sounds are clear Gastrointestinal: Soft and nontender. No distention.  Musculoskeletal: Nontender with normal range of motion in all extremities.  Neurologic:  Normal speech and language. No gross focal neurologic deficits Skin:  Skin is warm, dry and intact.  Psychiatric: Mood and affect are normal.  ____________________________________________    EKG  EKG viewed and interpreted by myself  shows a normal sinus rhythm at 60 bpm with a widened QRS, normal axis, largely normal intervals with nonspecific ST  changes.  ____________________________________________    RADIOLOGY  Chest x-ray is negative for acute abnormality  ____________________________________________   INITIAL IMPRESSION / ASSESSMENT AND PLAN / ED COURSE  Pertinent labs & imaging results that were available during my care of the patient were reviewed by me and considered in my medical decision making (see chart for details).   Patient presents to the emergency department for 3 days of intermittent chest pain somewhat worse this morning however now has resolved.  Patient takes aspirin every morning.  Slight shortness of breath, now resolved as well.  Wears 2.5 L of oxygen 24/7 for COPD.  No significant nausea or diaphoresis this morning.  Differential would include ACS, reflux, musculoskeletal or chest wall pain, esophageal or GI related discomfort.  We will check labs including cardiac enzymes obtain a chest x-ray and EKG.  Chest x-ray negative for acute abnormality.  Patient's repeat troponin is negative.  Patient remains chest pain-free in the emergency department.  Given his reassuring work-up and chest pain-free status I believe the patient is safe for discharge home with cardiology follow-up.  Discussed my normal chest pain return precautions.  Leslie Sims [facility] was evaluated in Emergency Department on 05/25/2020 for the symptoms described in the history of present illness. He was evaluated in the context of the global COVID-19 pandemic, which necessitated consideration that the patient might be at risk for infection with the SARS-CoV-2 virus that causes COVID-19. Institutional protocols and algorithms that pertain to the evaluation of patients at risk for COVID-19 are in a state of rapid change based on information released by regulatory bodies including the CDC and federal and state organizations. These policies and algorithms were followed during the patient's care in the  ED.  ____________________________________________   FINAL CLINICAL IMPRESSION(S) / ED DIAGNOSES  Chest pain   Harvest Dark, MD 05/25/20 1341

## 2020-05-25 NOTE — ED Triage Notes (Signed)
Pt took 324 mg ASA at home.  Arrived EMS for left sided intermittent chest pain that is now substernal. No radiation of pain. No associated sx.

## 2020-06-04 ENCOUNTER — Encounter: Payer: Self-pay | Admitting: Family

## 2020-06-04 ENCOUNTER — Ambulatory Visit (INDEPENDENT_AMBULATORY_CARE_PROVIDER_SITE_OTHER): Payer: Medicare Other | Admitting: Family

## 2020-06-04 ENCOUNTER — Other Ambulatory Visit: Payer: Self-pay

## 2020-06-04 VITALS — BP 106/72 | HR 86 | Ht 70.0 in | Wt 174.4 lb

## 2020-06-04 DIAGNOSIS — I25118 Atherosclerotic heart disease of native coronary artery with other forms of angina pectoris: Secondary | ICD-10-CM

## 2020-06-04 DIAGNOSIS — I10 Essential (primary) hypertension: Secondary | ICD-10-CM | POA: Diagnosis not present

## 2020-06-04 DIAGNOSIS — E785 Hyperlipidemia, unspecified: Secondary | ICD-10-CM

## 2020-06-04 MED ORDER — NITROGLYCERIN 0.4 MG SL SUBL: 0.4000 mg | SUBLINGUAL_TABLET | SUBLINGUAL | 3 refills | Status: DC | PRN

## 2020-06-04 NOTE — Progress Notes (Signed)
Office Visit    Patient Name: Leslie Sims [facility] Date of Encounter: 06/04/2020  Primary Care Provider:  [name], MD Primary Cardiologist:  Kathlyn Sacramento, MD Electrophysiologist:  None   Chief Complaint    Leslie Sims is a 69 y.o. male with a hx of anxiety, COPD on home O2, previous tobacco use, CAD, ICM, GERD, HTN, HLD presents today for ED follow up.    Past Medical History    Past Medical History:  Diagnosis Date  . Allergic rhinitis   . Anxiety   . Back pain   . Colon polyps 08/2012   colonoscopy; multiple colon polyps; repeat colonoscopy in one year.  [name] COPD (chronic obstructive pulmonary disease) ([place])    a. on home O2.  . Coronary artery disease 11/2009   a. late presenting ant MI; b. LHC 100% pLAD s/p PCI/DES, 99% mRCA s/p PCI/DES, EF 35%; c. nuclear stress test 05/13: prior ant/inf infarcts w/o ischemia, EF 43%; d. LHC 02/14: widely patent stents with no other obs dz, EF 40%; e. LHC 11/17: LM nl, mLAD 10%, patent LAD stent, dLAD 20%, p-mRCA 10%, mRCA 40%, patent RCA stent, EF 35-45%   . Depression   . GERD (gastroesophageal reflux disease)   . Headache(784.0)   . Helicobacter pylori (H. pylori)   . Hemorrhoids   . Hernia    inguinal  . HFrEF (heart failure with reduced ejection fraction) ([place])    a. 10/2016 LV gram: EF 35-45%; b. 06/2018 Echo: EF 40-45%.  . Hyperlipidemia   . Hypertension   . Insomnia   . Ischemic cardiomyopathy    a. 10/2016 LV gram: EF 35-45%; b. 06/2018 Echo: EF 40-45%.  . Myalgia   . Peptic ulcer   . Pulmonary nodules   . ST elevation (STEMI) myocardial infarction involving left anterior descending coronary artery ([place]) 11/2009   a. s/p PCI to the LAD  . Status post dilation of esophageal narrowing 2000  . Tinea pedis   . Wears dentures    partial upper   Past Surgical History:  Procedure Laterality Date  . Admission  12/20/2012   COPD exacerbation.  [place].  [name] CARDIAC CATHETERIZATION    . CARDIAC CATHETERIZATION   02/05/13   [facility]  . CARDIAC CATHETERIZATION  10/14   [facility] : patent stents with no change in anatomy. EF: 40$  . CARDIAC CATHETERIZATION Left 11/12/2016   Procedure: Left Heart Cath and Coronary Angiography;  Surgeon: Wellington Hampshire, MD;  Location: [place] CV LAB;  Service: Cardiovascular;  Laterality: Left;  . COLONOSCOPY    . COLONOSCOPY WITH PROPOFOL N/A 05/18/2016   Procedure: COLONOSCOPY WITH PROPOFOL;  Surgeon: Lucilla Lame, MD;  Location: [facility] ENDOSCOPY;  Service: Endoscopy;  Laterality: N/A;  . COLONOSCOPY WITH PROPOFOL N/A 03/19/2020   Procedure: COLONOSCOPY WITH PROPOFOL;  Surgeon: Lin Landsman, MD;  Location: [facility] ENDOSCOPY;  Service: Gastroenterology;  Laterality: N/A;  . CORONARY ANGIOPLASTY WITH STENT PLACEMENT  09/2009   LAD 3.0 X23 mm Xience DES, RCA: 4.0 X 15 mm Xience DES  . ELECTROMAGNETIC NAVIGATION BROCHOSCOPY N/A 10/27/2017   Procedure: ELECTROMAGNETIC NAVIGATION BRONCHOSCOPY;  Surgeon: Flora Lipps, MD;  Location: [facility] ORS;  Service: Cardiopulmonary;  Laterality: N/A;  . ESOPHAGEAL DILATION    . ESOPHAGOGASTRODUODENOSCOPY  2008  . ESOPHAGOGASTRODUODENOSCOPY  08/20/2012  . HERNIA REPAIR  07/20/2012   L inguinal hernia repair  . PENILE PROSTHESIS IMPLANT      Allergies  Allergies  Allergen Reactions  . Prozac [Fluoxetine  Hcl] Shortness Of Breath  . Effexor Xr [Venlafaxine Hcl Er] Other (See Comments)    "Makes me feel funny"  . Wellbutrin [Bupropion] Other (See Comments)    "Makes me feel funny"    History of Present Illness    Leslie Sims is a 69 y.o. male with a hx of anxiety, COPD on home O2, previous tobacco use, CAD, ICM, GERD, HTN, HLD last seen 12/04/19 by Dr. Fletcher Anon.  S/p MI 2010 with occluded LAD and 99% RCA. Treated wit DES to LAD/RCA. Underwent repeat cath 2017 with patent LAD & RCA stent with EF 35-40%.   Following with oncology for pulmonary nodules.  ED visit chest pain 05/25/20 with HS-trop on 7 and 8. CXR stable compared to  previous. Labs with Hb 13.1, K 3.9, creat 0.9, GFR >60. BP at PCP yesterday 105/71  Woke up Sunday morning with burning in his chest then it felt like something jabbed in his chest. Tells me the discomfort did not wake him from sleep but it was present when he woke up. Did not occur with exertion. Not associated with shortness of breath nor diaphoresis. Continued burning sensation. Called EMT in the house. Did not take nitroglycerin as his were old, requested refill. Tells me when he had his initial stents many years ago he "didn't even know I was having a heart attack" as he was asymptomatic.   Tells me he is "battling anxiety and depression" is following with psychiatry. Has undergone recent medication changes. Wonders if some of his chest discomfort could be related to his anxiety. He had to put his dog of 12 years to sleep last week and is coming up on the three year anniversary of his wife's passing. He does have two daughters in their 60s who live locally and offer support.  EKGs/Labs/Other Studies Reviewed:   The following studies were reviewed today:  EKG:  EKG is ordered today.  The ekg ordered today demonstrates NSR 86 bpm with stable TWI in septal leads and IVCD  Recent Labs: 05/25/2020: ALT 19; BUN 11; Creatinine, Ser 0.90; Hemoglobin 13.1; Platelets 246; Potassium 3.9; Sodium 141  Recent Lipid Panel    Component Value Date/Time   CHOL 143 07/27/2017 1525   TRIG 72 07/27/2017 1525   TRIG 65 01/15/2010 0000   HDL 62 07/27/2017 1525   CHOLHDL 2.3 07/27/2017 1525   CHOLHDL 1.8 08/24/2016 1344   VLDL 25 08/24/2016 1344   LDLCALC 67 07/27/2017 1525    Home Medications   No outpatient medications have been marked as taking for the 06/04/20 encounter (Appointment) with Loel Dubonnet, NP.      Review of Systems       Review of Systems  Constitutional: Negative for chills, fever and malaise/fatigue.  Cardiovascular: Negative for chest pain, dyspnea on exertion, leg swelling,  near-syncope, orthopnea, palpitations and syncope.  Respiratory: Negative for cough, shortness of breath and wheezing.   Gastrointestinal: Negative for nausea and vomiting.  Neurological: Negative for dizziness, light-headedness and weakness.  Psychiatric/Behavioral: The patient is nervous/anxious.    All other systems reviewed and are otherwise negative except as noted above.  Physical Exam    VS:  There were no vitals taken for this visit. , BMI There is no height or weight on file to calculate BMI. GEN: Well nourished, well developed, in no acute distress. HEENT: normal. Neck: Supple, no JVD, carotid bruits, or masses. Cardiac: RRR, no murmurs, rubs, or gallops. No clubbing, cyanosis, edema.  Radials/DP/PT 2+ and  equal bilaterally.  Respiratory:  Respirations regular and unlabored, clear to auscultation bilaterally. GI: Soft, nontender, nondistended, BS + x 4. MS: No deformity or atrophy. Skin: Warm and dry, no rash. Neuro:  Strength and sensation are intact. Psych: Normal affect.  Assessment & Plan    1. CAD s/p anterior infarct with DES to LAD and RCA 2010, cath 2017 stable anatomy - ED visit last week for burning sensation in chest with normal troponin, CXR with stable pulmonary findings. GDMT includes aspirin, statin. No beta blocker due to previous intolerance. EKG today with no acute ST/T wave changes. Chest pain atypical for angina as it occurred at rest. However, a few years since ischemic evaluation. With no recurrent chest pain since ED visit, politely declines [place] which is a reasonable decision. Refill of PRN nitroglycerin provided. Likely etiology of chest discomfort GERD vs anxiety.  2. HFrEF due to ICM with mildly reduced LVEF - Echo 2019 LVEF 40-45%. Euvolemic and well compensated. Did not tolerate beta blocker due to lung disease. Continue Losartan 25mg  daily, Lasix 20mg  daily.   3. HLD, LDL goal <70 - Continue Atorvastatin.   4. Severe COPD - Continue  home O2 and following with pulmonology.  Disposition: Follow up in 6 month(s) with Dr. Fletcher Anon or APP   Loel Dubonnet, NP 06/04/2020, 1:23 PM

## 2020-06-04 NOTE — Patient Instructions (Addendum)
Medication Instructions:  Your physician recommends that you continue on your current medications as directed. Please refer to the Current Medication list given to you today.  Refill sent for Nitrogylcerin As needed Take NTG Place 1 tablet (0.4 mg total) under the tongue every 5 (five) minutes as needed for chest pain. Max 3 tablets. If chest pain is not relieved after 3 tablets seek urgent medical attention.   *If you need a refill on your cardiac medications before your next appointment, please call your pharmacy*   Lab Work: None ordered If you have labs (blood work) drawn today and your tests are completely normal, you will receive your results only by: [name] MyChart Message (if you have MyChart) OR . A paper copy in the mail If you have any lab test that is abnormal or we need to change your treatment, we will call you to review the results.   Testing/Procedures: None ordered   Follow-Up: At [facility], you and your health needs are our priority.  As part of our continuing mission to provide you with exceptional heart care, we have created designated Provider Care Teams.  These Care Teams include your primary Cardiologist (physician) and Advanced Practice Providers (APPs -  Physician Assistants and Nurse Practitioners) who all work together to provide you with the care you need, when you need it.  We recommend signing up for the patient portal called "MyChart".  Sign up information is provided on this After Visit Summary.  MyChart is used to connect with patients for Virtual Visits (Telemedicine).  Patients are able to view lab/test results, encounter notes, upcoming appointments, etc.  Non-urgent messages can be sent to your provider as well.   To learn more about what you can do with MyChart, go to [email or web address].    Your next appointment:  Follow up 3 months with Dr. Fletcher Anon or app

## 2020-06-24 ENCOUNTER — Telehealth: Payer: Self-pay | Admitting: *Deleted

## 2020-06-24 ENCOUNTER — Other Ambulatory Visit: Payer: Self-pay | Admitting: Urology

## 2020-06-24 MED ORDER — DIAZEPAM 5 MG PO TABS
5.0000 mg | ORAL_TABLET | Freq: Once | ORAL | 0 refills | Status: DC | PRN
Start: 2020-06-24 — End: 2020-08-13

## 2020-06-24 NOTE — Telephone Encounter (Signed)
Patient called requesting a prescription sent in prior to his MRI, scheduled on 07/09/20. Patient is claustrophobic. Please advise.

## 2020-06-24 NOTE — Telephone Encounter (Signed)
Patient informed, voiced understanding.  °

## 2020-06-24 NOTE — Telephone Encounter (Signed)
Valium sent to [facility], can take 30 mins before MRI, needs driver, thanks  Nickolas Madrid, MD 06/24/2020

## 2020-07-09 ENCOUNTER — Ambulatory Visit
Admission: RE | Admit: 2020-07-09 | Discharge: 2020-07-09 | Disposition: A | Discharge status: Home / Self Care | Payer: Medicare Other | Source: Ambulatory Visit | Attending: Urology | Admitting: Urology

## 2020-07-09 ENCOUNTER — Other Ambulatory Visit: Payer: Self-pay

## 2020-07-09 DIAGNOSIS — R972 Elevated prostate specific antigen [PSA]: Secondary | ICD-10-CM | POA: Diagnosis present

## 2020-07-09 IMAGING — MR MR PROSTATE WO/W CM
56 series · 56 of 56 positions shown · IV contrast (7ml Gadavist)
Comparison: PET-CT [DATE]

CLINICAL DATA: Elevated PSA level

EXAM:
MR PROSTATE WITHOUT AND WITH CONTRAST
TECHNIQUE: Multiplanar multisequence MRI images were obtained of the pelvis
centered about the prostate. Pre and post contrast images were
obtained.
CONTRAST:  10mL GADAVIST GADOBUTROL 1 MMOL/ML IV SOLN

[Series 4: T2 · axial · 3.0mm · 0.56mm/px · 1 of 30 slices shown (1 of 3)]
[im 1/30]
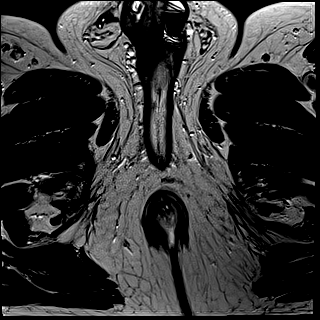

[Series 5: ax in&out whole · axial · 5.0mm · 0.74mm/px · 1 of 80 slices shown]
[im 1/80]
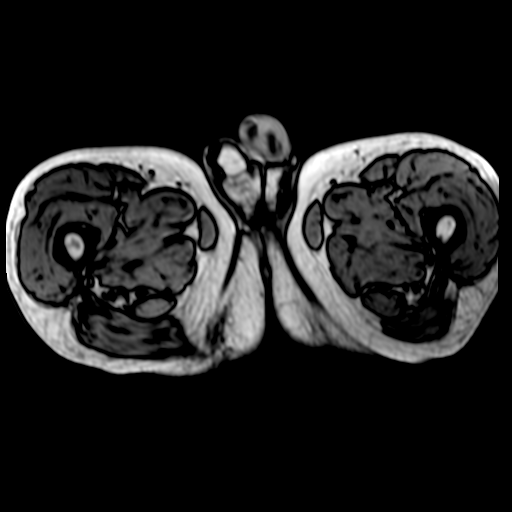

[Series 6: T2 · coronal · 3.0mm · 0.70mm/px · 1 of 35 slices shown (2 of 3)]
[im 1/35]
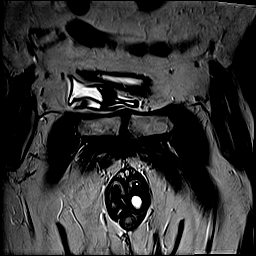

[Series 7: DWI · axial · 3.0mm · 0.86mm/px · 1 of 93 slices shown (1 of 3)]
[im 1/93]
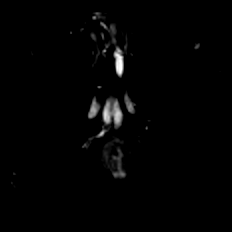

[Series 8: DWI · axial · 3.0mm · 0.86mm/px · 1 of 31 slices shown (2 of 3)]
[im 1/31]
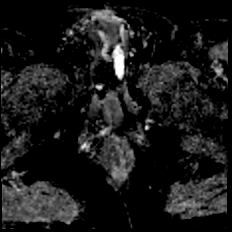

[Series 9: DWI · axial · 3.0mm · 0.86mm/px · 1 of 31 slices shown (3 of 3)]
[im 1/31]
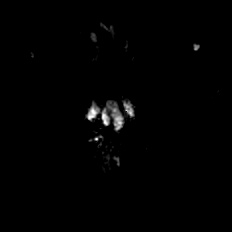

[Series 11: T2 · axial · 1.0mm · 1.04mm/px · 1 of 80 slices shown (3 of 3)]
[im 1/80]
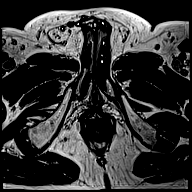

[Series 12: T1 · axial · 3.0mm · 1.15mm/px · 1 of 36 slices shown (1 of 49)]
[im 1/36]
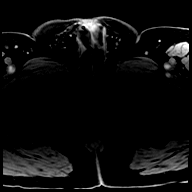

[Series 13: T1 · axial · 3.0mm · 1.15mm/px · 1 of 36 slices shown (2 of 49)]
[im 1/36]
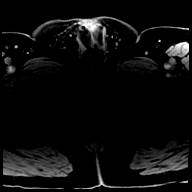

[Series 14: T1 · axial · 3.0mm · 1.15mm/px · 1 of 36 slices shown (3 of 49)]
[im 1/36]
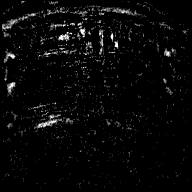

[Series 15: T1 · axial · 3.0mm · 1.15mm/px · 1 of 36 slices shown (4 of 49)]
[im 1/36]
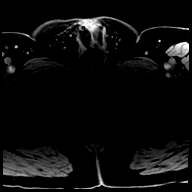

[Series 16: T1 · axial · 3.0mm · 1.15mm/px · 1 of 36 slices shown (5 of 49)]
[im 1/36]
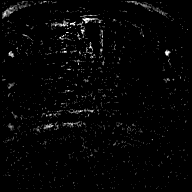

[Series 17: T1 · axial · 3.0mm · 1.15mm/px · 1 of 36 slices shown (6 of 49)]
[im 1/36]
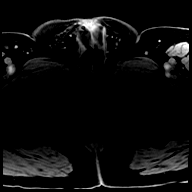

[Series 18: T1 · axial · 3.0mm · 1.15mm/px · 1 of 36 slices shown (7 of 49)]
[im 1/36]
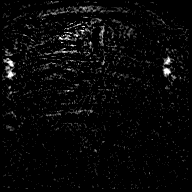

[Series 19: T1 · axial · 3.0mm · 1.15mm/px · 1 of 36 slices shown (8 of 49)]
[im 1/36]
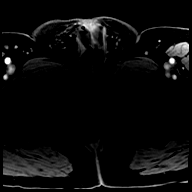

[Series 20: T1 · axial · 3.0mm · 1.15mm/px · 1 of 36 slices shown (9 of 49)]
[im 1/36]
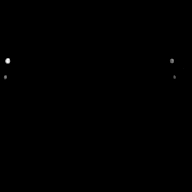

[Series 21: T1 · axial · 3.0mm · 1.15mm/px · 1 of 36 slices shown (10 of 49)]
[im 1/36]
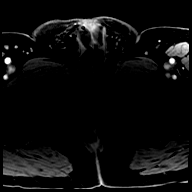

[Series 22: T1 · axial · 3.0mm · 1.15mm/px · 1 of 36 slices shown (11 of 49)]
[im 1/36]
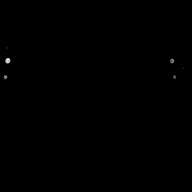

[Series 23: T1 · axial · 3.0mm · 1.15mm/px · 1 of 36 slices shown (12 of 49)]
[im 1/36]
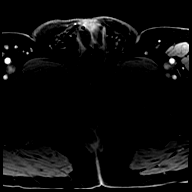

[Series 24: T1 · axial · 3.0mm · 1.15mm/px · 1 of 36 slices shown (13 of 49)]
[im 1/36]
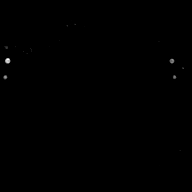

[Series 25: T1 · axial · 3.0mm · 1.15mm/px · 1 of 36 slices shown (14 of 49)]
[im 1/36]
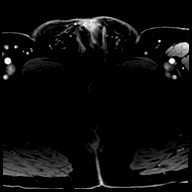

[Series 26: T1 · axial · 3.0mm · 1.15mm/px · 1 of 36 slices shown (15 of 49)]
[im 1/36]
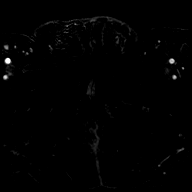

[Series 27: T1 · axial · 3.0mm · 1.15mm/px · 1 of 36 slices shown (16 of 49)]
[im 1/36]
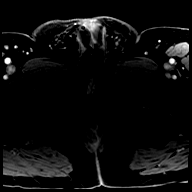

[Series 28: T1 · axial · 3.0mm · 1.15mm/px · 1 of 36 slices shown (17 of 49)]
[im 1/36]
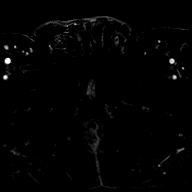

[Series 29: T1 · axial · 3.0mm · 1.15mm/px · 1 of 36 slices shown (18 of 49)]
[im 1/36]
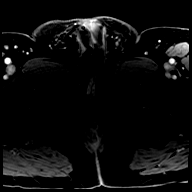

[Series 30: T1 · axial · 3.0mm · 1.15mm/px · 1 of 35 slices shown (19 of 49)]
[im 1/35]
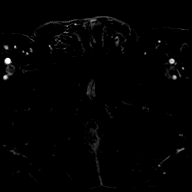

[Series 31: T1 · axial · 3.0mm · 1.15mm/px · 1 of 36 slices shown (20 of 49)]
[im 1/36]
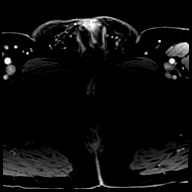

[Series 32: T1 · axial · 3.0mm · 1.15mm/px · 1 of 35 slices shown (21 of 49)]
[im 1/35]
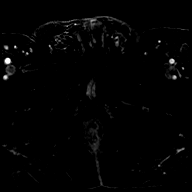

[Series 33: T1 · axial · 3.0mm · 1.15mm/px · 1 of 36 slices shown (22 of 49)]
[im 1/36]
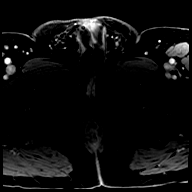

[Series 34: T1 · axial · 3.0mm · 1.15mm/px · 1 of 36 slices shown (23 of 49)]
[im 1/36]
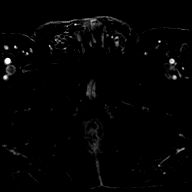

[Series 35: T1 · axial · 3.0mm · 1.15mm/px · 1 of 36 slices shown (24 of 49)]
[im 1/36]
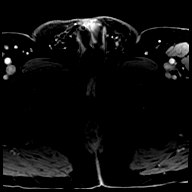

[Series 36: T1 · axial · 3.0mm · 1.15mm/px · 1 of 36 slices shown (25 of 49)]
[im 1/36]
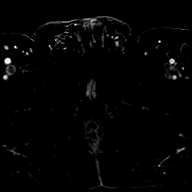

[Series 37: T1 · axial · 3.0mm · 1.15mm/px · 1 of 36 slices shown (26 of 49)]
[im 1/36]
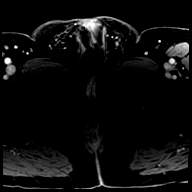

[Series 38: T1 · axial · 3.0mm · 1.15mm/px · 1 of 36 slices shown (27 of 49)]
[im 1/36]
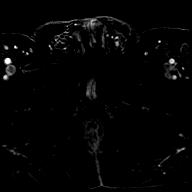

[Series 39: T1 · axial · 3.0mm · 1.15mm/px · 1 of 36 slices shown (28 of 49)]
[im 1/36]
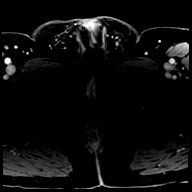

[Series 40: T1 · axial · 3.0mm · 1.15mm/px · 1 of 35 slices shown (29 of 49)]
[im 1/35]
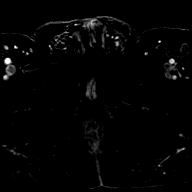

[Series 41: T1 · axial · 3.0mm · 1.15mm/px · 1 of 36 slices shown (30 of 49)]
[im 1/36]
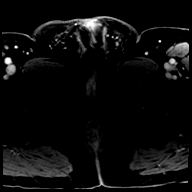

[Series 42: T1 · axial · 3.0mm · 1.15mm/px · 1 of 36 slices shown (31 of 49)]
[im 1/36]
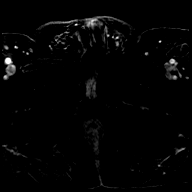

[Series 43: T1 · axial · 3.0mm · 1.15mm/px · 1 of 36 slices shown (32 of 49)]
[im 1/36]
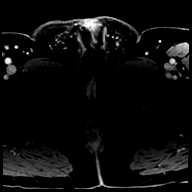

[Series 44: T1 · axial · 3.0mm · 1.15mm/px · 1 of 36 slices shown (33 of 49)]
[im 1/36]
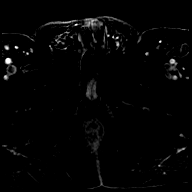

[Series 45: T1 · axial · 3.0mm · 1.15mm/px · 1 of 36 slices shown (34 of 49)]
[im 1/36]
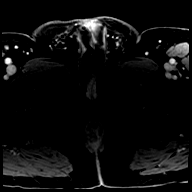

[Series 46: T1 · axial · 3.0mm · 1.15mm/px · 1 of 36 slices shown (35 of 49)]
[im 1/36]
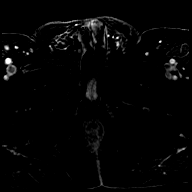

[Series 47: T1 · axial · 3.0mm · 1.15mm/px · 1 of 36 slices shown (36 of 49)]
[im 1/36]
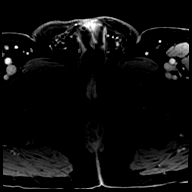

[Series 48: T1 · axial · 3.0mm · 1.15mm/px · 1 of 36 slices shown (37 of 49)]
[im 1/36]
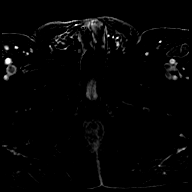

[Series 49: T1 · axial · 3.0mm · 1.15mm/px · 1 of 36 slices shown (38 of 49)]
[im 1/36]
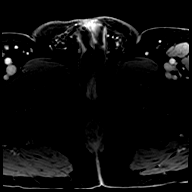

[Series 50: T1 · axial · 3.0mm · 1.15mm/px · 1 of 36 slices shown (39 of 49)]
[im 1/36]
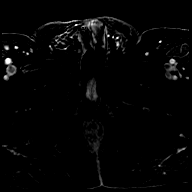

[Series 51: T1 · axial · 3.0mm · 1.15mm/px · 1 of 36 slices shown (40 of 49)]
[im 1/36]
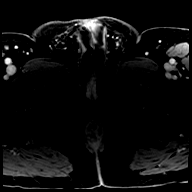

[Series 52: T1 · axial · 3.0mm · 1.15mm/px · 1 of 36 slices shown (41 of 49)]
[im 1/36]
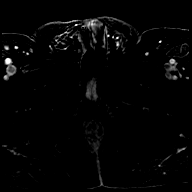

[Series 53: T1 · axial · 3.0mm · 1.15mm/px · 1 of 36 slices shown (42 of 49)]
[im 1/36]
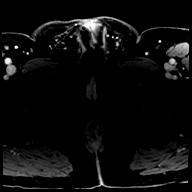

[Series 54: T1 · axial · 3.0mm · 1.15mm/px · 1 of 36 slices shown (43 of 49)]
[im 1/36]
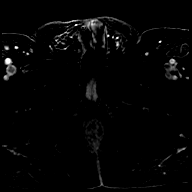

[Series 55: T1 · axial · 3.0mm · 1.15mm/px · 1 of 36 slices shown (44 of 49)]
[im 1/36]
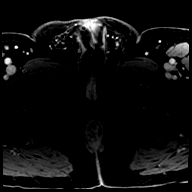

[Series 56: T1 · axial · 3.0mm · 1.15mm/px · 1 of 36 slices shown (45 of 49)]
[im 1/36]
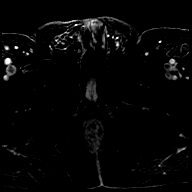

[Series 57: T1 · axial · 3.0mm · 1.15mm/px · 1 of 36 slices shown (46 of 49)]
[im 1/36]
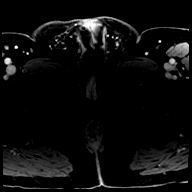

[Series 58: T1 · axial · 3.0mm · 1.15mm/px · 1 of 35 slices shown (47 of 49)]
[im 1/35]
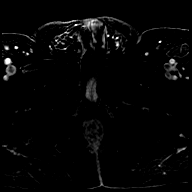

[Series 59: T1 · axial · 3.0mm · 1.15mm/px · 1 of 36 slices shown (48 of 49)]
[im 1/36]
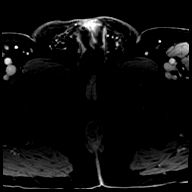

[Series 60: T1 · axial · 3.0mm · 1.15mm/px · 1 of 36 slices shown (49 of 49)]
[im 1/36]
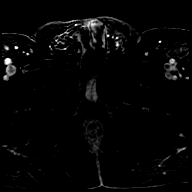

[56 of 56 positions shown; findings below may reference images not displayed]

FINDINGS: Prostate:

Region of interest # 1: PI-RADS category 4 lesion the left
posterolateral peripheral zone in the prostate base and extending
slightly into the mid gland, with reduced T2 signal and faint but
focal early enhancement. This measures 0.67 cubic cm (1.6 by 0.8 by
1.0 cm) and is shown for example on image [DATE].

Region of interest # 2: PI-RADS category 3 lesion of the left
anterior and posterior transition zone in the base along with the
bilateral anterior and posterior transition zones in the mid gland
and apex. This consists of somewhat indistinct and irregular
geographic low T2 signal and moderately accentuated diffusion but
not highly accentuated diffusion characteristics. This region
measures 7.74 cubic cm (2.3 by 2.2 by 2.3 cm) and is shown for
example on image 48/11.

Volume: 3D volumetric analysis: Prostate volume 39.38 cubic cm (5.0
by 3.9 by 4.3 cm).

Transcapsular spread:  Absent

Seminal vesicle involvement: Absent

Neurovascular bundle involvement: Absent

Pelvic adenopathy: Absent

Bone metastasis: Absent

Other findings: Penile implant.
IMPRESSION: 1. Small PI-RADS category 4 lesion in the left posterolateral
peripheral zone in the prostate base and extending slightly into the
mid gland.
2. PI-RADS category 3 lesion in count the sing most of the bilateral
transition zone with geographic low T2 signal and moderately
accentuated diffusion weighted characteristics.
3. Targeting data sent to [facility].
4. No evidence of extracapsular extension, seminal vesicle
involvement, or metastatic disease.
5. Penile implant noted.

## 2020-07-09 MED ORDER — GADOBUTROL 1 MMOL/ML IV SOLN
7.0000 mL | Freq: Once | INTRAVENOUS | Status: AC | PRN
  Administered 2020-07-09: 10 mL via INTRAVENOUS

## 2020-07-16 ENCOUNTER — Other Ambulatory Visit: Payer: Self-pay | Admitting: Urology

## 2020-07-16 MED ORDER — DIAZEPAM 5 MG PO TABS
5.0000 mg | ORAL_TABLET | Freq: Once | ORAL | 0 refills | Status: DC | PRN
Start: 2020-07-16 — End: 2020-08-13

## 2020-07-17 ENCOUNTER — Telehealth: Payer: Self-pay

## 2020-07-17 NOTE — Telephone Encounter (Signed)
-----   Message from Billey Co, MD sent at 07/16/2020 12:43 PM EDT ----- Prostate MRI shows a small suspicious prostate lesion, please set up prostate biopsy with me here in [place] and review instructions, I will send a valium to his pharmacy. Thanks  Nickolas Madrid, MD 07/16/2020

## 2020-07-18 ENCOUNTER — Telehealth: Payer: Self-pay | Admitting: Cardiovascular Disease

## 2020-07-18 NOTE — Telephone Encounter (Signed)
   [facility] Pre-operative Risk Assessment    HEARTCARE STAFF: - Please ensure there is not already an duplicate clearance open for this procedure. - Under Visit Info/Reason for Call, type in Other and utilize the format Clearance MM/DD/YY or Clearance TBD. Do not use dashes or single digits. - If request is for dental extraction, please clarify the # of teeth to be extracted.  Request for surgical clearance:  1. What type of surgery is being performed? Prostate biopsy   2. When is this surgery scheduled? 07/30/20  3. What type of clearance is required (medical clearance vs. Pharmacy clearance to hold med vs. Both)? both  4. Are there any medications that need to be held prior to surgery and how long? Aspirin 81 mg  5. Practice name and name of physician performing surgery? [facility]   6. What is the office phone number? [phone]   7.   What is the office fax number? [phone]  8.   Anesthesia type (None, local, MAC, general) ? Not noted   Marykay Lex 07/18/2020, 1:16 PM  _________________________________________________________________   (provider comments below)

## 2020-07-18 NOTE — Telephone Encounter (Signed)
-----   Message from Billey Co, MD sent at 07/16/2020 12:43 PM EDT ----- Prostate MRI shows a small suspicious prostate lesion, please set up prostate biopsy with me here in [place] and review instructions, I will send a valium to his pharmacy. Thanks  Nickolas Madrid, MD 07/16/2020

## 2020-07-18 NOTE — Telephone Encounter (Signed)
Called pt informed him of the information below. Pt gave verbal understanding. BX appt scheduled, results appt scheduled. Biopsy instructions mailed to pt and sent via mychart. Cardiac clearance faxed to Dr. Fletcher Anon to d/c ASA 81mg .

## 2020-07-18 NOTE — Telephone Encounter (Signed)
Primary Cardiologist:Muhammad Fletcher Anon, MD  Chart reviewed as part of pre-operative protocol coverage. Because of Leslie Sims's past medical history and time since last visit, he/she will require a follow-up visit in order to better assess preoperative cardiovascular risk.  Pre-op covering staff: - Please schedule appointment and call patient to inform them. - Please contact requesting surgeon's office via preferred method (i.e, phone, fax) to inform them of need for appointment prior to surgery.  If applicable, this message will also be routed to pharmacy pool and/or primary cardiologist for input on holding anticoagulant/antiplatelet agent as requested below so that this information is available at time of patient's appointment.   Deberah Pelton, NP  07/18/2020, 2:57 PM

## 2020-07-21 NOTE — Telephone Encounter (Signed)
[place] office was able to get pt in 07/29/20 with Dr. Fletcher Anon for pre op clearance. I will forward clearance notes to MD for upcoming appt. I will remove from the pre op call back pool.

## 2020-07-21 NOTE — Telephone Encounter (Signed)
I will send an FYI to requesting office pt has appt 07/29/20 with cardiologist.

## 2020-07-21 NOTE — Telephone Encounter (Signed)
Sent message to [place] office to see if they can help find an appt. I have reviewed [place] schedules and do not see anything before the pt's surgery date.

## 2020-07-24 ENCOUNTER — Other Ambulatory Visit: Payer: Self-pay

## 2020-07-24 ENCOUNTER — Ambulatory Visit
Admission: RE | Admit: 2020-07-24 | Discharge: 2020-07-24 | Disposition: A | Discharge status: Home / Self Care | Payer: Medicare Other | Source: Ambulatory Visit | Attending: Hematology and Oncology | Admitting: Hematology and Oncology

## 2020-07-24 DIAGNOSIS — R918 Other nonspecific abnormal finding of lung field: Secondary | ICD-10-CM | POA: Diagnosis present

## 2020-07-24 IMAGING — CT CT CHEST W/O CM
1 series · 14 of 34 positions shown, 18 images · non-contrast
Comparison: [DATE] and multiple prior studies.

CLINICAL DATA: LEFT upper lobe nodule and area of suspected
scarring, follow-up from [DATE]

EXAM:
CT CHEST WITHOUT CONTRAST
TECHNIQUE: Multidetector CT imaging of the chest was performed following the
standard protocol without IV contrast.

[Series 3: thorax · axial · 0.85mm/px · z∈[-640,-364]mm · 14 of 163 slices shown, 18 images]
[im 13/163  mediastinal]
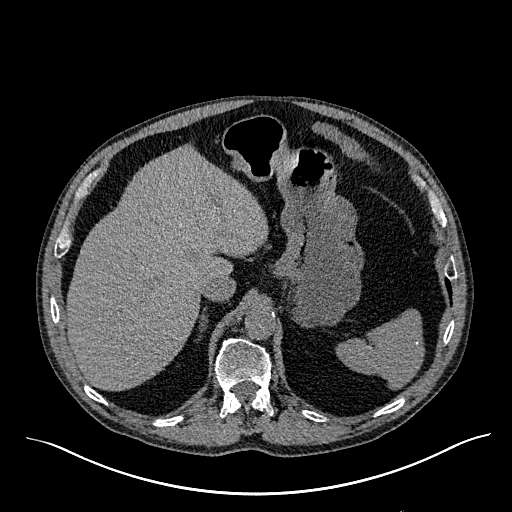
[im 13/163  lung]
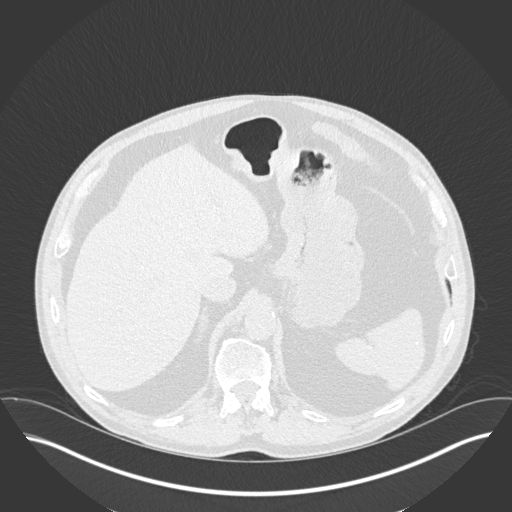
[im 25/163  lung]
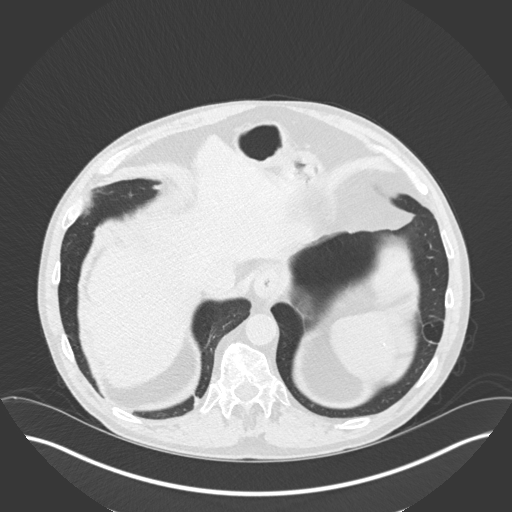
[im 33/163  lung]
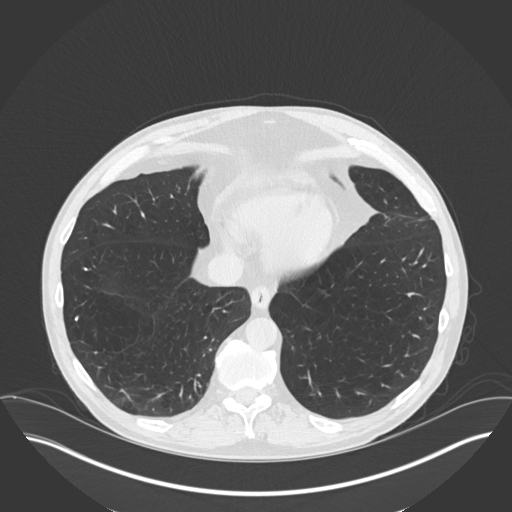
[im 49/163  lung]
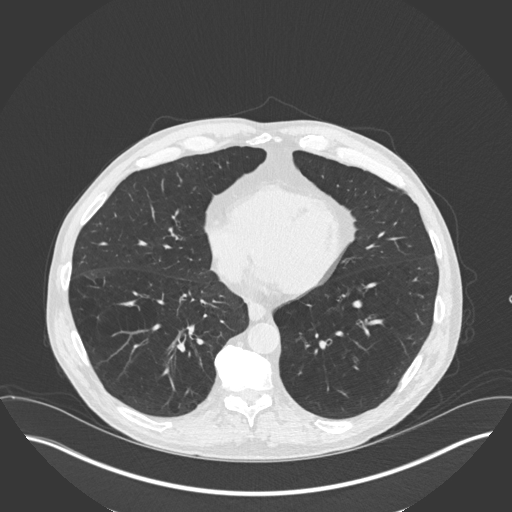
[im 61/163  mediastinal]
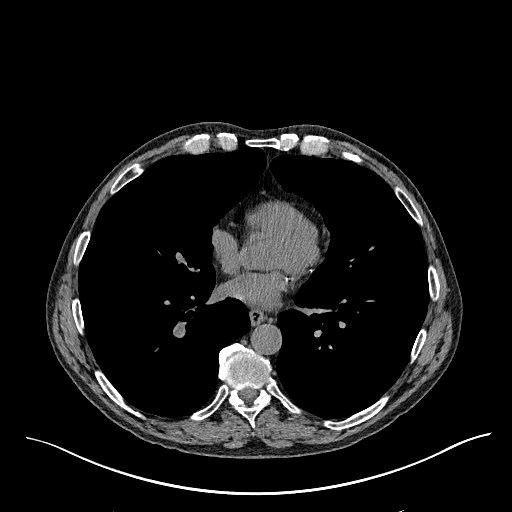
[im 61/163  lung]
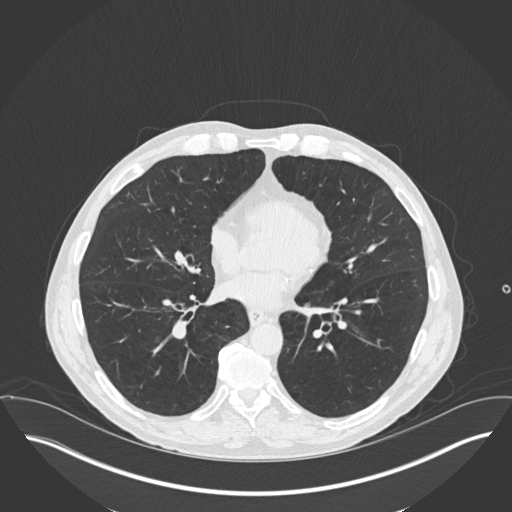
[im 67/163  lung]
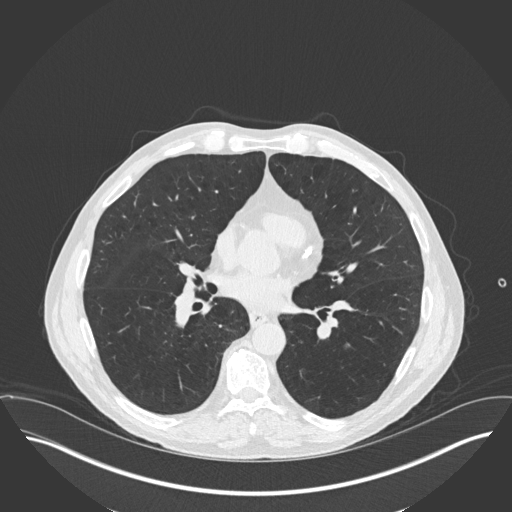
[im 77/163  lung]
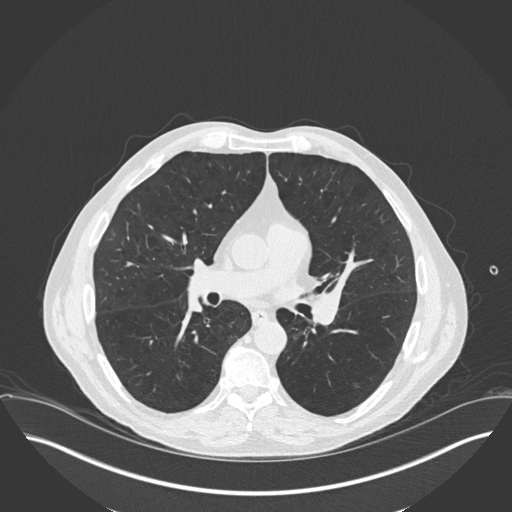
[im 87/163  lung]
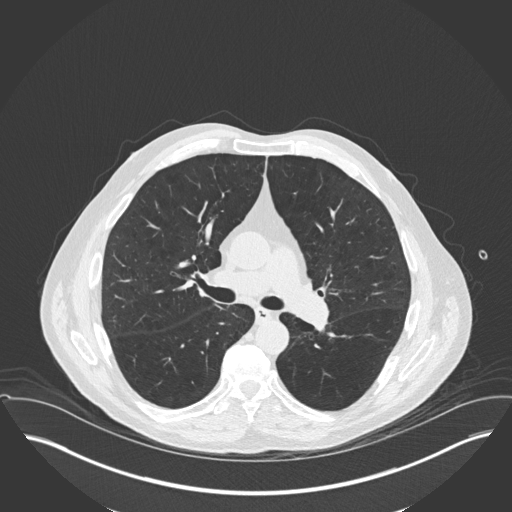
[im 97/163  mediastinal]
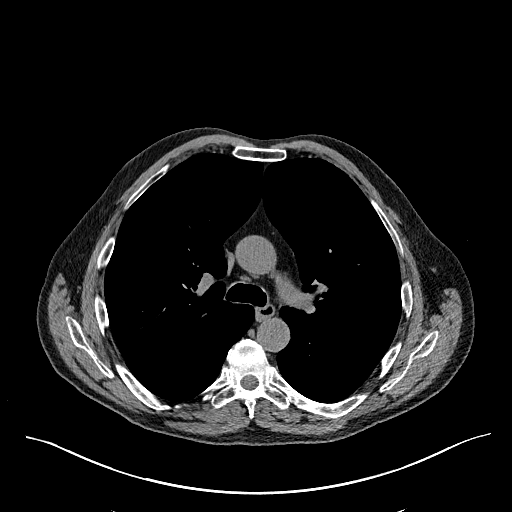
[im 97/163  lung]
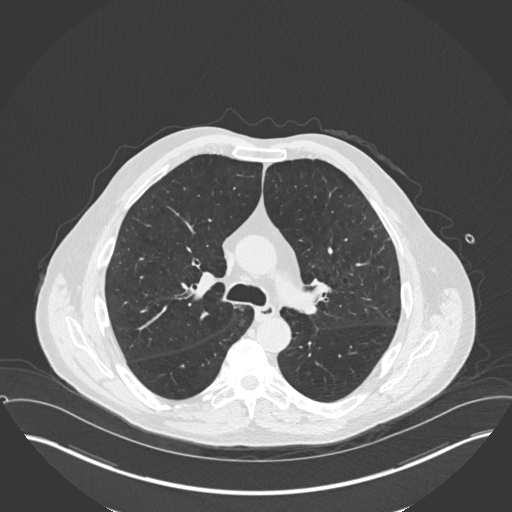
[im 103/163  lung]
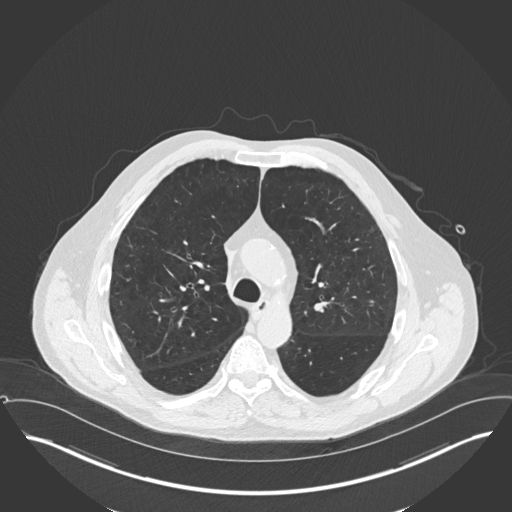
[im 121/163  lung]
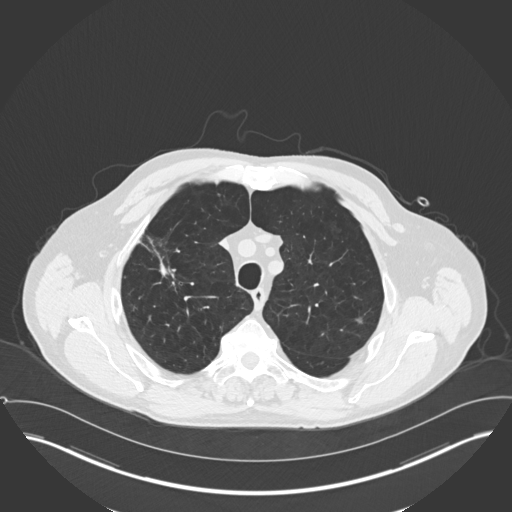
[im 130/163  lung]
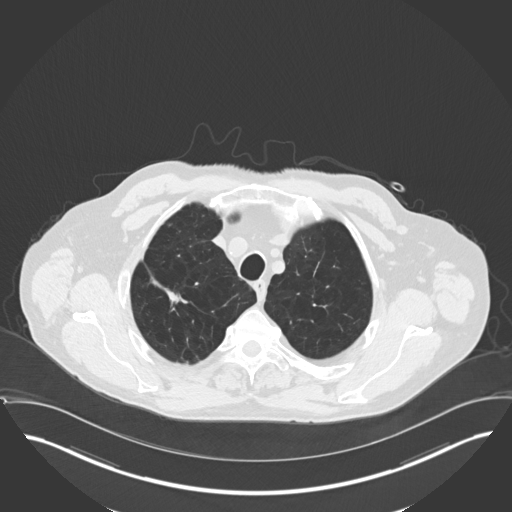
[im 139/163  mediastinal]
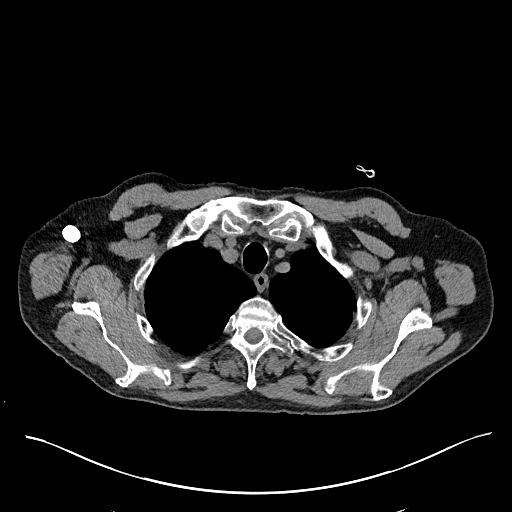
[im 139/163  lung]
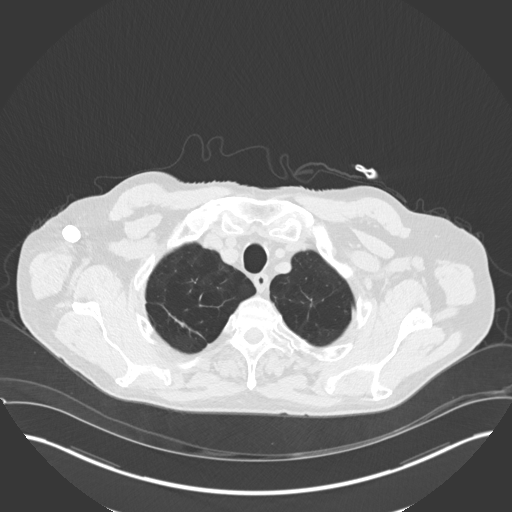
[im 151/163  lung]
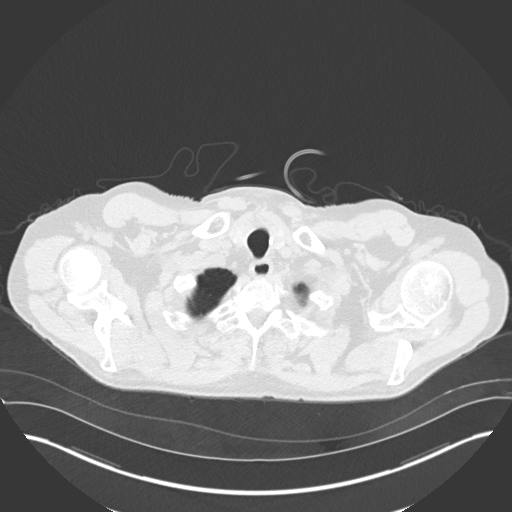

[14 of 34 positions shown; findings below may reference images not displayed]

FINDINGS: Cardiovascular: Calcified atheromatous plaque of the thoracic aorta.
Signs of prior myocardial infarction with sub endocardial fat and
with evidence of prior percutaneous coronary intervention and
calcified coronary artery disease similar to the previous study.
Small pericardial effusion is unchanged. No sign of aortic
aneurysmal dilation. Limited assessment of vascular structures in
the chest with 3 cm central pulmonary artery also unchanged.

Mediastinum/Nodes: Thoracic inlet structures are normal.

No axillary lymphadenopathy. No mediastinal adenopathy. No hilar
adenopathy. Esophagus mildly patulous.

Lungs/Pleura: Marked pulmonary emphysema. Bandlike area in the RIGHT
upper lobe with associated nodular thickening measuring
approximately 5 mm greatest thickness (image 34, series 2) thickness
unchanged compared to the recent study of [DATE] but markedly
diminished when compared to the study [DATE]

(Image 45, series [DATE] x 8 mm nodule shows central calcification
and is unchanged.

Stable 4-5 mm LEFT upper lobe nodule amidst marked emphysematous
changes.

Small nodule in the superior segment of the RIGHT LEFT lower lobe
measures approximately 4 mm also unchanged.

At the site of the new abnormality identified on the prior study in
the LEFT upper lobe there is a 3 mm area of nodularity with some
subtle ground-glass reflecting near complete resolution of the
previously identified abnormality. Airways are patent. No
consolidation. No pleural effusion.

Upper Abdomen: Stable LEFT adrenal adenoma approximately 2 cm. RIGHT
adrenal thickening displays a benign appearance and is unchanged. No
acute upper abdominal process to the extent evaluated.

Musculoskeletal: No chest wall mass.  Spinal degenerative change.
IMPRESSION: 1. Background of marked pulmonary emphysema with stable post
infectious or inflammatory changes and with near complete resolution
of a presumably infectious or inflammatory nodule in the LEFT upper
lobe as compared to previous exam.
2. Signs of coronary artery disease, PTCI and prior myocardial
infarction as before.
3. LEFT adrenal adenoma unchanged.
4. Emphysema and aortic atherosclerosis.

Aortic Atherosclerosis ([75]-[75]) and Emphysema ([75]-[75]).

## 2020-07-24 NOTE — Progress Notes (Signed)
[facility]  [address] [place], [place] [address] Phone: [phone]  Fax: [phone]   Clinic Day:  07/28/2020  Referring physician: [name], MD  Chief Complaint: Leslie Sims [facility] is a 69 y.o. male with bilateral pulmonary nodules who is seen for 6 month assessment.   HPI: The patient was last seen in the medical oncoolgy clinic on 05/06/2020. At that time, he felt good. He had recently completed steroids for COPD flare. His wheezing had improved. He had a nonproductive cough and shortness of breath.  The patient saw Dr. Diamantina Providence, on 05/20/2020 for a follow up regarding his elevated PSA (most recent was 4.30 on 04/25/2020). Prostate MRI revealed a small PI-RADS category 4 lesion in the left posterolateral peripheral zone in the prostate base and extending slightly into the mid gland. There was a PI-RADS category 3 lesion in the left anterior and posterior zone in the base along with the bilateral anterior and posterior transition zones in the mid gland and apex.  There was indistinct and irregular geographic low T2 signal and moderately accentuated diffusion. Targeting data was sent to [facility]. There was no evidence of extracapsular extension, seminal vesicle involvement, or metastatic disease. A penile implant was noted.   The patient will be getting a biopsy on 08/13/2020 and has an appointment on 07/29/2020 with a cardiologist for clearance.  Chest CT without contrast on 07/24/2020 revealed a background of marked pulmonary emphysema with stable post infectious or inflammatory changes and with near complete resolution of a presumably infectious or inflammatory nodule in the LEFT upper lobe.  There were signs of coronary artery disease, PTCI and prior myocardial infarction.  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  There was emphysema and aortic atherosclerosis.  During the interim, he has been "ok". He reports not feeling well for the past 2  days. He has had discomfort in his chest and his left arm hurts. His shortness of breath has worsened and he feels dizzy and weak today. The patient is currently on 3 liters/min of oxygen and uses 4 liters/min while he wears a mask. He has COPD and 2 stents. His cough has resolved.  He states that his mood has been "down" because he is battling anxiety and depression. His medications were changed a couple of weeks ago, but his symptoms have not improved.  The patient agrees to follow up prn going forward.   Past Medical History:  Diagnosis Date  . Allergic rhinitis   . Anxiety   . Back pain   . Colon polyps 08/2012   colonoscopy; multiple colon polyps; repeat colonoscopy in one year.  [name] COPD (chronic obstructive pulmonary disease) ([place])    a. on home O2.  . Coronary artery disease 11/2009   a. late presenting ant MI; b. LHC 100% pLAD s/p PCI/DES, 99% mRCA s/p PCI/DES, EF 35%; c. nuclear stress test 05/13: prior ant/inf infarcts w/o ischemia, EF 43%; d. LHC 02/14: widely patent stents with no other obs dz, EF 40%; e. LHC 11/17: LM nl, mLAD 10%, patent LAD stent, dLAD 20%, p-mRCA 10%, mRCA 40%, patent RCA stent, EF 35-45%   . Depression   . GERD (gastroesophageal reflux disease)   . Headache(784.0)   . Helicobacter pylori (H. pylori)   . Hemorrhoids   . Hernia    inguinal  . HFrEF (heart failure with reduced ejection fraction) ([place])    a. 10/2016 LV gram: EF 35-45%; b. 06/2018 Echo: EF 40-45%.  . Hyperlipidemia   .  Hypertension   . Insomnia   . Ischemic cardiomyopathy    a. 10/2016 LV gram: EF 35-45%; b. 06/2018 Echo: EF 40-45%.  . Myalgia   . Peptic ulcer   . Pulmonary nodules   . ST elevation (STEMI) myocardial infarction involving left anterior descending coronary artery ([place]) 11/2009   a. s/p PCI to the LAD  . Status post dilation of esophageal narrowing 2000  . Tinea pedis   . Wears dentures    partial upper    Past Surgical History:  Procedure Laterality Date  .  Admission  12/20/2012   COPD exacerbation.  [place].  [name] CARDIAC CATHETERIZATION    . CARDIAC CATHETERIZATION  02/05/13   [facility]  . CARDIAC CATHETERIZATION  10/14   [facility] : patent stents with no change in anatomy. EF: 40$  . CARDIAC CATHETERIZATION Left 11/12/2016   Procedure: Left Heart Cath and Coronary Angiography;  Surgeon: Wellington Hampshire, MD;  Location: [facility];  Service: Cardiovascular;  Laterality: Left;  . COLONOSCOPY    . COLONOSCOPY WITH PROPOFOL N/A 05/18/2016   Procedure: COLONOSCOPY WITH PROPOFOL;  Surgeon: Lucilla Lame, MD;  Location: [facility] ENDOSCOPY;  Service: Endoscopy;  Laterality: N/A;  . COLONOSCOPY WITH PROPOFOL N/A 03/19/2020   Procedure: COLONOSCOPY WITH PROPOFOL;  Surgeon: Lin Landsman, MD;  Location: [facility] ENDOSCOPY;  Service: Gastroenterology;  Laterality: N/A;  . CORONARY ANGIOPLASTY WITH STENT PLACEMENT  09/2009   LAD 3.0 X23 mm Xience DES, RCA: 4.0 X 15 mm Xience DES  . ELECTROMAGNETIC NAVIGATION BROCHOSCOPY N/A 10/27/2017   Procedure: ELECTROMAGNETIC NAVIGATION BRONCHOSCOPY;  Surgeon: Flora Lipps, MD;  Location: [facility] ORS;  Service: Cardiopulmonary;  Laterality: N/A;  . ESOPHAGEAL DILATION    . ESOPHAGOGASTRODUODENOSCOPY  2008  . ESOPHAGOGASTRODUODENOSCOPY  08/20/2012  . HERNIA REPAIR  07/20/2012   L inguinal hernia repair  . PENILE PROSTHESIS IMPLANT      Family History  Problem Relation Age of Onset  . Heart attack Brother        Brother #1  . Diabetes Brother   . Hypertension Brother        #3  . Coronary artery disease Father 36       deceased  . Heart attack Father   . Diabetes Father   . Heart disease Father   . COPD Mother 20       deceased  . Alcohol abuse Sister        polysubstance abuse  . COPD Sister   . Lung cancer Sister   . Alcohol abuse Sister        polysubstance abuse  . Penile cancer Brother   . Diabetes Brother   . Prostate cancer Neg Hx   . Bladder Cancer Neg Hx   . Kidney cancer Neg Hx     Social History:   reports that he quit smoking about 17 months ago. His smoking use included cigarettes. He has a 42.00 pack-year smoking history. He has never used smokeless tobacco. He reports that he does not drink alcohol and does not use drugs.  He quit smoking 02-24-2019.  His wife died 2.5 years ago. He is retired by used to work in home improvement.  He lives in [place] with his dogs. The patient is alone today.  Allergies:  Allergies  Allergen Reactions  . Prozac [Fluoxetine Hcl] Shortness Of Breath  . Effexor Xr [Venlafaxine Hcl Er] Other (See Comments)    "Makes me feel funny"  . Wellbutrin [Bupropion] Other (See Comments)    "  Makes me feel funny"    Current Medications: Current Outpatient Medications  Medication Sig Dispense Refill  . albuterol (PROVENTIL HFA;VENTOLIN HFA) 108 (90 Base) MCG/ACT inhaler Inhale 2 puffs into the lungs every 6 (six) hours as needed for wheezing or shortness of breath. 1 Inhaler 2  . aspirin 81 MG tablet Take 81 mg by mouth daily.      [name] atorvastatin (LIPITOR) 40 MG tablet Take 1 tablet (40 mg total) by mouth daily. 90 tablet 3  . Fluticasone-Salmeterol (ADVAIR) 500-50 MCG/DOSE AEPB Inhale 1 puff into the lungs every 12 (twelve) hours.    . furosemide (LASIX) 20 MG tablet Take 20 mg by mouth daily.     . hydrOXYzine (VISTARIL) 25 MG capsule Take 25 mg by mouth 2 (two) times daily as needed for anxiety or itching.   0  . ipratropium-albuterol (DUONEB) 0.5-2.5 (3) MG/3ML SOLN USE 1 VIAL VIA NEBULIZER AND INHALE BY MOUTH 4 TIMES A DAY 1620 mL 1  . LORazepam (ATIVAN) 1 MG tablet Take 1 tablet (1 mg total) by mouth every 4 (four) hours while awake. 30 tablet 0  . losartan (COZAAR) 25 MG tablet Take 25 mg by mouth daily.    . mirtazapine (REMERON) 15 MG tablet Take 15 mg by mouth at bedtime.    . Multiple Vitamin (MULTIVITAMIN) capsule Take 1 capsule by mouth daily.    . nitroGLYCERIN (NITROSTAT) 0.4 MG SL tablet Place 1 tablet (0.4 mg total) under the tongue every 5 (five)  minutes as needed. 25 tablet 3  . OLANZapine (ZYPREXA) 2.5 MG tablet Take 2.5 mg by mouth at bedtime.    . OXYGEN Inhale 3 L into the lungs daily.     . OXYGEN Inhale 2.5 L into the lungs daily.    . risperiDONE (RISPERDAL) 1 MG tablet Take by mouth.    . SPIRIVA HANDIHALER 18 MCG inhalation capsule PLACE 1 CAPSULE IN HANDIHALER, PUNCTURE CAPSULE, THEN INHALE CONTENTS OF CAPSULE BY MOUTH ONCE DAILY    . sucralfate (CARAFATE) 1 g tablet Take 1 g by mouth 4 (four) times daily.    . diazepam (VALIUM) 5 MG tablet Take 1 tablet (5 mg total) by mouth once as needed for up to 1 dose (TAKE 30 MINUTES PRIOR TO MRI). (Patient not taking: Reported on 07/28/2020) 1 tablet 0  . diazepam (VALIUM) 5 MG tablet Take 1 tablet (5 mg total) by mouth once as needed for up to 1 dose (take 30 minutes before prostate biopsy). (Patient not taking: Reported on 07/28/2020) 1 tablet 0  . FLUoxetine (PROZAC) 20 MG capsule Take by mouth.    . sertraline (ZOLOFT) 50 MG tablet TAKE ONE TABLET BY MOUTH EVERY MORNING FOR 7 DAYS. THEN INCREASE TWO TABLETS EVERY MORNING (Patient not taking: Reported on 07/28/2020)     No current facility-administered medications for this visit.    Review of Systems  Constitutional: Positive for weight loss (8 lbs). Negative for chills, diaphoresis, fever and malaise/fatigue.       Not feeling well.  HENT: Negative.  Negative for congestion, ear discharge, ear pain, hearing loss, nosebleeds, sinus pain, sore throat and tinnitus.   Eyes: Negative.  Negative for blurred vision, double vision and photophobia.  Respiratory: Positive for shortness of breath (chronic, worsening). Negative for cough, hemoptysis and sputum production.        3 liters of oxygen at home. 4 liters of oxygen with mask. COPD.  Cardiovascular: Positive for chest pain (discomfort). Negative for palpitations, orthopnea, leg  swelling and PND.  Gastrointestinal: Negative.  Negative for abdominal pain, blood in stool, constipation,  diarrhea, heartburn, melena, nausea and vomiting.  Genitourinary: Negative.  Negative for dysuria, frequency, hematuria and urgency.  Musculoskeletal: Negative for back pain, joint pain, myalgias and neck pain.       Arm pain.  Skin: Negative.  Negative for itching and rash.  Neurological: Positive for dizziness and weakness. Negative for tingling, sensory change, speech change, focal weakness and headaches.  Endo/Heme/Allergies: Negative.  Does not bruise/bleed easily.  Psychiatric/Behavioral: Positive for depression. Negative for memory loss. The patient is nervous/anxious. The patient does not have insomnia.   All other systems reviewed and are negative.  Performance status (ECOG):  2  Vitals Blood pressure (!) 143/86, pulse 84, temperature (!) 97.2 F (36.2 C), temperature source Tympanic, resp. rate 18, weight 171 lb 8.3 oz (77.8 kg), SpO2 96 %.   Physical Exam Vitals and nursing note reviewed.  Constitutional:      General: He is not in acute distress.    Appearance: He is well-developed. He is not ill-appearing, toxic-appearing or diaphoretic.  HENT:     Head: Normocephalic and atraumatic.     Comments: Dark hair.    Mouth/Throat:     Mouth: Mucous membranes are moist.     Pharynx: Oropharynx is clear. No oropharyngeal exudate.  Eyes:     General: No scleral icterus.    Extraocular Movements: Extraocular movements intact.     Conjunctiva/sclera: Conjunctivae normal.     Pupils: Pupils are equal, round, and reactive to light.  Neck:     Vascular: No JVD.  Cardiovascular:     Rate and Rhythm: Normal rate and regular rhythm.     Heart sounds: Normal heart sounds. No murmur heard.   Pulmonary:     Effort: Pulmonary effort is normal. No accessory muscle usage or respiratory distress.     Breath sounds: Normal breath sounds. No wheezing or rales.  Chest:     Chest wall: No tenderness.  Abdominal:     General: Bowel sounds are normal. There is no distension.      Palpations: Abdomen is soft. There is no mass.     Tenderness: There is no abdominal tenderness. There is no guarding or rebound.     Comments: Ventral hernia.  Musculoskeletal:        General: No swelling or tenderness. Normal range of motion.     Cervical back: Normal range of motion.  Lymphadenopathy:     Head:     Right side of head: No preauricular, posterior auricular or occipital adenopathy.     Left side of head: No preauricular, posterior auricular or occipital adenopathy.     Cervical: No cervical adenopathy.     Upper Body:     Right upper body: No supraclavicular or axillary adenopathy.     Left upper body: No supraclavicular or axillary adenopathy.     Lower Body: No right inguinal adenopathy. No left inguinal adenopathy.  Skin:    General: Skin is warm and dry.     Coloration: Skin is not pale.     Findings: No erythema or rash.     Comments: Tattoo "love" on his left knuckles.  Neurological:     Mental Status: He is alert and oriented to person, place, and time.  Psychiatric:        Behavior: Behavior normal.        Thought Content: Thought content normal.  Judgment: Judgment normal.    No visits with results within 3 Day(s) from this visit.  Latest known visit with results is:  Admission on 05/25/2020, Discharged on 05/25/2020  Component Date Value Ref Range Status  . WBC 05/25/2020 5.6  4.0 - 10.5 K/uL Final  . RBC 05/25/2020 4.56  4.22 - 5.81 MIL/uL Final  . Hemoglobin 05/25/2020 13.1  13.0 - 17.0 g/dL Final  . HCT 05/25/2020 41.3  39 - 52 % Final  . MCV 05/25/2020 90.6  80.0 - 100.0 fL Final  . MCH 05/25/2020 28.7  26.0 - 34.0 pg Final  . MCHC 05/25/2020 31.7  30.0 - 36.0 g/dL Final  . RDW 05/25/2020 12.2  11.5 - 15.5 % Final  . Platelets 05/25/2020 246  150 - 400 K/uL Final  . nRBC 05/25/2020 0.0  0.0 - 0.2 % Final   Performed at [facility], [address] [place], [place] [address]  . Sodium 05/25/2020 141  135 - 145 mmol/L Final  .  Potassium 05/25/2020 3.9  3.5 - 5.1 mmol/L Final  . Chloride 05/25/2020 104  98 - 111 mmol/L Final  . CO2 05/25/2020 30  22 - 32 mmol/L Final  . Glucose, Bld 05/25/2020 106* 70 - 99 mg/dL Final   Glucose reference range applies only to samples taken after fasting for at least 8 hours.  . BUN 05/25/2020 11  8 - 23 mg/dL Final  . Creatinine, Ser 05/25/2020 0.90  0.61 - 1.24 mg/dL Final  . Calcium 05/25/2020 8.8* 8.9 - 10.3 mg/dL Final  . Total Protein 05/25/2020 6.5  6.5 - 8.1 g/dL Final  . Albumin 05/25/2020 3.9  3.5 - 5.0 g/dL Final  . AST 05/25/2020 18  15 - 41 U/L Final  . ALT 05/25/2020 19  0 - 44 U/L Final  . Alkaline Phosphatase 05/25/2020 59  38 - 126 U/L Final  . Total Bilirubin 05/25/2020 0.8  0.3 - 1.2 mg/dL Final  . GFR calc non Af Amer 05/25/2020 >60  >60 mL/min Final  . GFR calc Af Amer 05/25/2020 >60  >60 mL/min Final  . Anion gap 05/25/2020 7  5 - 15 Final   Performed at [facility], [address] [place], [place] [address]  . Lipase 05/25/2020 27  11 - 51 U/L Final   Performed at [facility], [address] [place], [place] [address]  . Troponin I (High Sensitivity) 05/25/2020 7  <18 ng/L Final   Comment: (NOTE) Elevated high sensitivity troponin I (hsTnI) values and significant  changes across serial measurements may suggest ACS but many other  chronic and acute conditions are known to elevate hsTnI results.  Refer to the "Links" section for chest pain algorithms and additional  guidance. Performed at [facility], [address] [place], [place] [address]   . Troponin I (High Sensitivity) 05/25/2020 8  <18 ng/L Final   Comment: (NOTE) Elevated high sensitivity troponin I (hsTnI) values and significant  changes across serial measurements may suggest ACS but many other  chronic and acute conditions are known to elevate hsTnI results.  Refer to the "Links" section for chest pain algorithms and additional  guidance. Performed at  [facility], [address] [place], [place] [address]     Assessment:  Nathanel Tallman is a 69 y.o. male with bilateral pulmonary nodules s/p electromagnetic navigation bronchoscopy on 10/27/2017.  Pathology revealed alveolated lung tissue with hemosiderin laden macrophages, negative for atypia and malignancy from the  RUL and LUL.  He has a 42 pack year smoking history.  Chest CT without contrast on 10/04/2017 revealed two new irregularly marginated nodules, 1.4 x 1.8 cm in the right upper lobe, and 1.3 x 1.7 cm in the mid left upper lobe worrisome for either synchronous lung carcinoma or metastatic involvement of the lungs. Two small nodules described on the prior dictated report had resolved in the left superior segment and most likely were postinflammatory. There was diffuse centrilobular emphysema and changes of prior granulomatous disease.  PET scan on 10/13/2017 revealed the left upper lobe nodule was FDG avid (SUV 5.8) consistent with malignancy. The new nodule in the right upper lobe demonstrated low level uptake (SUV 1.4), with the possibility of an inflammatory or infectious process and a low-grade malignancy could not be excluded. No distant metastatic disease was identified.  CT chest on 05/01/2019 revealed a stable left upper lobe nodule, likely benign. There was waxing/waning right lung opacities, suggesting infection, including atypical/mycobacterial infection.  Improvement of the prior dominant masslike opacity argued against neoplasm.  Chest CT on 11/02/2019 revealed the vast majority of pulmonary nodules were stable. The largest of the previously noted pulmonary nodules in the right upper lobe had regressed significantly compared to the prior study, indicative of a resolving infectious/inflammatory process. There was diffuse bronchial wall thickening with moderate centrilobular and paraseptal emphysema.  Imaging findings were suggestive of underlying COPD.  There  were bilateral adrenal adenomas, aortic atherosclerosis,left main and 3 vessel coronary artery disease.  There was low-attenuation in the myocardium throughout the mid to distal LAD territory compatible with fibrofatty metaplasia from prior LAD territory myocardial infarction(s).  Chest CT on 04/23/2020 revealed multiple bilateral stable pulmonary nodules. There was a new 7 mm nodule over the posterior left upper lobe. There was stable nodularity over an area of right upper lobe/apical scarring with new 9 mm nodular focus along the most inferior aspect of this scarring. The area had shown to wax and wane on previous exams and was most likely inflammatory.  There was a stable 2.6 cm left adrenal adenoma.  He received the Moderna vaccinations (01/26/2020, 02/23/2020).  Symptomatically, he has not felt well for 2 days. He has had discomfort in his chest and his left arm. Shortness of breath has worsened; he feels dizzy and weak. His mood has been "down"; he is battling anxiety and depression.   Plan: 1.   Bilateral pulmonary nodules            Chest CT from 04/23/2020 was personally reviewed. Agree with radiology interpretation.     Most nodules are stable except for a new 7 mm posterior LUL and 9 mm nodular focus in the RUL/apical scarring.  Discuss plan for follow-up chest CT in 3 months.  Lung nodules are likely inflammatory/infectious.  He previously had a negative biopsy by [facility].             Review interval note by Dr Patsey Berthold on 04/21/2020.   He has a follow-up with pulmonary medicine in 2 months.  Anticipate follow-up imaging with pulmonary medicine. 2.   Chest discomfort and shortness of breath  Symptoms appear cardiac in nature.  Patient to [facility] or ER. 3.   RTC prn.  I discussed the assessment and treatment plan with the patient.  The patient was provided an opportunity to ask questions and all were answered.  The patient agreed with the plan and demonstrated an  understanding of the instructions.  The patient was  advised to call back if the symptoms worsen or if the condition fails to improve as anticipated.   Lequita Asal, MD, PhD    07/28/2020, 11:21 AM  I, Mirian Mo Tufford, am acting as a Education administrator for Lequita Asal, MD.  I, [place] Mike Gip, MD, have reviewed the above documentation for accuracy and completeness, and I agree with the above.

## 2020-07-28 ENCOUNTER — Other Ambulatory Visit: Payer: Self-pay

## 2020-07-28 ENCOUNTER — Inpatient Hospital Stay: Payer: Medicare Other | Attending: Hematology and Oncology | Admitting: Hematology and Oncology

## 2020-07-28 ENCOUNTER — Telehealth: Payer: Self-pay

## 2020-07-28 ENCOUNTER — Ambulatory Visit
Admission: EM | Admit: 2020-07-28 | Discharge: 2020-07-28 | Disposition: A | Discharge status: Home / Self Care | Payer: Medicare Other | Attending: Family Medicine | Admitting: Family Medicine

## 2020-07-28 ENCOUNTER — Encounter: Payer: Self-pay | Admitting: Hematology and Oncology

## 2020-07-28 VITALS — BP 143/86 | HR 84 | Temp 97.2°F | Resp 18 | Wt 171.5 lb

## 2020-07-28 DIAGNOSIS — J449 Chronic obstructive pulmonary disease, unspecified: Secondary | ICD-10-CM | POA: Diagnosis not present

## 2020-07-28 DIAGNOSIS — R0789 Other chest pain: Secondary | ICD-10-CM | POA: Insufficient documentation

## 2020-07-28 DIAGNOSIS — F419 Anxiety disorder, unspecified: Secondary | ICD-10-CM | POA: Diagnosis not present

## 2020-07-28 DIAGNOSIS — R5383 Other fatigue: Secondary | ICD-10-CM | POA: Insufficient documentation

## 2020-07-28 DIAGNOSIS — R918 Other nonspecific abnormal finding of lung field: Secondary | ICD-10-CM | POA: Diagnosis not present

## 2020-07-28 DIAGNOSIS — F329 Major depressive disorder, single episode, unspecified: Secondary | ICD-10-CM | POA: Insufficient documentation

## 2020-07-28 DIAGNOSIS — R531 Weakness: Secondary | ICD-10-CM | POA: Insufficient documentation

## 2020-07-28 DIAGNOSIS — R972 Elevated prostate specific antigen [PSA]: Secondary | ICD-10-CM | POA: Diagnosis not present

## 2020-07-28 DIAGNOSIS — R42 Dizziness and giddiness: Secondary | ICD-10-CM | POA: Diagnosis not present

## 2020-07-28 DIAGNOSIS — R0602 Shortness of breath: Secondary | ICD-10-CM | POA: Diagnosis not present

## 2020-07-28 DIAGNOSIS — I2510 Atherosclerotic heart disease of native coronary artery without angina pectoris: Secondary | ICD-10-CM

## 2020-07-28 DIAGNOSIS — Z87891 Personal history of nicotine dependence: Secondary | ICD-10-CM | POA: Diagnosis not present

## 2020-07-28 DIAGNOSIS — R079 Chest pain, unspecified: Secondary | ICD-10-CM | POA: Diagnosis not present

## 2020-07-28 DIAGNOSIS — Z9981 Dependence on supplemental oxygen: Secondary | ICD-10-CM | POA: Diagnosis not present

## 2020-07-28 LAB — CBC WITH DIFFERENTIAL/PLATELET
Abs Immature Granulocytes: 0.03 10*3/uL (ref 0.00–0.07)
Basophils Absolute: 0 10*3/uL (ref 0.0–0.1)
Basophils Relative: 0 %
Eosinophils Absolute: 0.1 10*3/uL (ref 0.0–0.5)
Eosinophils Relative: 1 %
HCT: 39.6 % (ref 39.0–52.0)
Hemoglobin: 12.8 g/dL — ABNORMAL LOW (ref 13.0–17.0)
Immature Granulocytes: 0 %
Lymphocytes Relative: 17 %
Lymphs Abs: 1.1 10*3/uL (ref 0.7–4.0)
MCH: 29.1 pg (ref 26.0–34.0)
MCHC: 32.3 g/dL (ref 30.0–36.0)
MCV: 90 fL (ref 80.0–100.0)
Monocytes Absolute: 0.7 10*3/uL (ref 0.1–1.0)
Monocytes Relative: 11 %
Neutro Abs: 4.7 10*3/uL (ref 1.7–7.7)
Neutrophils Relative %: 71 %
Platelets: 226 10*3/uL (ref 150–400)
RBC: 4.4 MIL/uL (ref 4.22–5.81)
RDW: 12.6 % (ref 11.5–15.5)
WBC: 6.7 10*3/uL (ref 4.0–10.5)
nRBC: 0 % (ref 0.0–0.2)

## 2020-07-28 LAB — COMPREHENSIVE METABOLIC PANEL
ALT: 19 U/L (ref 0–44)
AST: 19 U/L (ref 15–41)
Albumin: 4.1 g/dL (ref 3.5–5.0)
Alkaline Phosphatase: 57 U/L (ref 38–126)
Anion gap: 8 (ref 5–15)
BUN: 10 mg/dL (ref 8–23)
CO2: 31 mmol/L (ref 22–32)
Calcium: 9 mg/dL (ref 8.9–10.3)
Chloride: 99 mmol/L (ref 98–111)
Creatinine, Ser: 0.91 mg/dL (ref 0.61–1.24)
GFR calc Af Amer: >60 mL/min (ref >60)
GFR calc non Af Amer: >60 mL/min (ref >60)
Glucose, Bld: 99 mg/dL (ref 70–99)
Potassium: 3.9 mmol/L (ref 3.5–5.1)
Sodium: 138 mmol/L (ref 135–145)
Total Bilirubin: 0.7 mg/dL (ref 0.3–1.2)
Total Protein: 6.7 g/dL (ref 6.5–8.1)

## 2020-07-28 LAB — TROPONIN I (HIGH SENSITIVITY): Troponin I (High Sensitivity): 5 ng/L (ref <18)

## 2020-07-28 NOTE — Discharge Instructions (Signed)
Labs and EKG reassuring.  Rest.  Follow up with cardiology.  Take care  Dr. Lacinda Axon

## 2020-07-28 NOTE — ED Triage Notes (Signed)
Pt sent from [facility]. States he arrived for appointment and told them he had been feeling lightheaded and having chest discomfort for past couple days. Appt with cardiology tomorrow.

## 2020-07-28 NOTE — Progress Notes (Signed)
Patient here for oncology follow-up appointment, expresses complaints of weakness(states it may be his depression), chest dullness and dizziness today,

## 2020-07-28 NOTE — ED Provider Notes (Signed)
[facility]    CSN: [id number] Arrival date & time: 07/28/20  1209      History   Chief Complaint Chief Complaint  Patient presents with   Dizziness   HPI  69 year old male presents for evaluation of dizziness, fatigue, chest pain.  Patient reports that he was at the cancer center today.  He was sent over here for evaluation given his symptoms.  He reports a 2-day history of intermittent chest pain.  No association with exertion.  Pain 4/10 in severity.  Patient reports that he is also had some lightheadedness/dizziness.  Also feels fatigued and disinterested in doing things.  He attributes this to depression.  No fever.  No relieving factors.  No other complaints.  Past Medical History:  Diagnosis Date   Allergic rhinitis    Anxiety    Back pain    Colon polyps 08/2012   colonoscopy; multiple colon polyps; repeat colonoscopy in one year.   COPD (chronic obstructive pulmonary disease) ([place])    a. on home O2.   Coronary artery disease 11/2009   a. late presenting ant MI; b. LHC 100% pLAD s/p PCI/DES, 99% mRCA s/p PCI/DES, EF 35%; c. nuclear stress test 05/13: prior ant/inf infarcts w/o ischemia, EF 43%; d. LHC 02/14: widely patent stents with no other obs dz, EF 40%; e. LHC 11/17: LM nl, mLAD 10%, patent LAD stent, dLAD 20%, p-mRCA 10%, mRCA 40%, patent RCA stent, EF 35-45%    Depression    GERD (gastroesophageal reflux disease)    JOACZYSA(630.1)    Helicobacter pylori (H. pylori)    Hemorrhoids    Hernia    inguinal   HFrEF (heart failure with reduced ejection fraction) ([place])    a. 10/2016 LV gram: EF 35-45%; b. 06/2018 Echo: EF 40-45%.   Hyperlipidemia    Hypertension    Insomnia    Ischemic cardiomyopathy    a. 10/2016 LV gram: EF 35-45%; b. 06/2018 Echo: EF 40-45%.   Myalgia    Peptic ulcer    Pulmonary nodules    ST elevation (STEMI) myocardial infarction involving left anterior descending coronary artery ([place]) 11/2009   a. s/p PCI  to the LAD   Status post dilation of esophageal narrowing 2000   Tinea pedis    Wears dentures    partial upper    Patient Active Problem List   Diagnosis Date Noted   Elevated PSA 07/28/2020   Goals of care, counseling/discussion    Palliative care by specialist    Healthcare-associated pneumonia 01/28/2019   COPD with acute exacerbation ([place]) 12/31/2018   Acute respiratory failure with hypoxia ([place]) 09/29/2018   Acute respiratory failure with hypoxia and hypercapnia ([place]) 09/13/2018   Acute on chronic respiratory failure with hypoxia and hypercapnia ([place]) 08/24/2018   Chest pain 07/02/2018   Grief reaction 01/13/2018   COPD with hypoxia ([place]) 12/25/2017   Unstable angina (HCC)    Benign neoplasm of ascending colon    Benign neoplasm of descending colon    Benign neoplasm of sigmoid colon    History of colonic polyps    Pulmonary nodules/lesions, multiple 01/05/2015   Bruit of left carotid artery 11/04/2014   Sleep disorder 07/18/2013   Seasonal and perennial allergic rhinitis 05/29/2013   Bradycardia 04/20/2011   SMOKER 07/30/2010   CAD, NATIVE VESSEL 04/23/2010   Hyperlipidemia 03/24/2010   DEPRESSION/ANXIETY 60/09/9322   Chronic systolic heart failure ([place]) 03/24/2010   COPD, very severe ([place]) 03/24/2010   GERD 03/24/2010   Essential  hypertension 11/21/2009    Past Surgical History:  Procedure Laterality Date   Admission  12/20/2012   COPD exacerbation.  [place].   CARDIAC CATHETERIZATION     CARDIAC CATHETERIZATION  02/05/13   [facility]   CARDIAC CATHETERIZATION  10/14   [facility] : patent stents with no change in anatomy. EF: 40$   CARDIAC CATHETERIZATION Left 11/12/2016   Procedure: Left Heart Cath and Coronary Angiography;  Surgeon: Wellington Hampshire, MD;  Location: [place];  Service: Cardiovascular;  Laterality: Left;   COLONOSCOPY     COLONOSCOPY WITH PROPOFOL N/A 05/18/2016   Procedure: COLONOSCOPY WITH PROPOFOL;   Surgeon: Lucilla Lame, MD;  Location: [facility] ENDOSCOPY;  Service: Endoscopy;  Laterality: N/A;   COLONOSCOPY WITH PROPOFOL N/A 03/19/2020   Procedure: COLONOSCOPY WITH PROPOFOL;  Surgeon: Lin Landsman, MD;  Location: [facility] END[facility]COPY;  Service: Gastroenterology;  Laterality: N/A;   CORONARY ANGIOPLASTY WITH STENT PLACEMENT  09/2009   LAD 3.0 X23 mm Xience DES, RCA: 4.0 X 15 mm Xience DES   ELECTROMAGNETIC NAVIGATION BROCHOSCOPY N/A 10/27/2017   Procedure: ELECTROMAGNETIC NAVIGATION BRONCHOSCOPY;  Surgeon: Flora Lipps, MD;  Location: [facility] ORS;  Service: Cardiopulmonary;  Laterality: N/A;   ESOPHAGEAL DILATION     ESOPHAGOGASTRODUODENOSCOPY  2008   ESOPHAGOGASTRODUODENOSCOPY  08/20/2012   HERNIA REPAIR  07/20/2012   L inguinal hernia repair   PENILE PROSTHESIS IMPLANT         Home Medications    Prior to Admission medications   Medication Sig Start Date End Date Taking? Authorizing Provider  albuterol (PROVENTIL HFA;VENTOLIN HFA) 108 (90 Base) MCG/ACT inhaler Inhale 2 puffs into the lungs every 6 (six) hours as needed for wheezing or shortness of breath. 11/01/18   Rudene Re, MD  aspirin 81 MG tablet Take 81 mg by mouth daily.      [provider]  atorvastatin (LIPITOR) 40 MG tablet Take 1 tablet (40 mg total) by mouth daily. 03/03/18   [name], MD  diazepam (VALIUM) 5 MG tablet Take 1 tablet (5 mg total) by mouth once as needed for up to 1 dose (TAKE 30 MINUTES PRIOR TO MRI). Patient not taking: Reported on 07/28/2020 06/24/20   Billey Co, MD  diazepam (VALIUM) 5 MG tablet Take 1 tablet (5 mg total) by mouth once as needed for up to 1 dose (take 30 minutes before prostate biopsy). Patient not taking: Reported on 07/28/2020 07/16/20   Billey Co, MD  FLUoxetine (PROZAC) 20 MG capsule Take by mouth. 07/04/20   [provider]  Fluticasone-Salmeterol (ADVAIR) 500-50 MCG/DOSE AEPB Inhale 1 puff into the lungs every 12 (twelve) hours. 11/29/18    [provider]  furosemide (LASIX) 20 MG tablet Take 20 mg by mouth daily.     [provider]  hydrOXYzine (VISTARIL) 25 MG capsule Take 25 mg by mouth 2 (two) times daily as needed for anxiety or itching.  06/05/18   [provider]  ipratropium-albuterol (DUONEB) 0.5-2.5 (3) MG/3ML SOLN USE 1 VIAL VIA NEBULIZER AND INHALE BY MOUTH 4 TIMES A DAY 01/21/20   [name], MD  LORazepam (ATIVAN) 1 MG tablet Take 1 tablet (1 mg total) by mouth every 4 (four) hours while awake. 02/14/19   [name], MD  losartan (COZAAR) 25 MG tablet Take 25 mg by mouth daily. 02/23/19   [provider]  mirtazapine (REMERON) 15 MG tablet Take 15 mg by mouth at bedtime. 06/02/20   [provider]  Multiple  Vitamin (MULTIVITAMIN) capsule Take 1 capsule by mouth daily.    [provider]  nitroGLYCERIN (NITROSTAT) 0.4 MG SL tablet Place 1 tablet (0.4 mg total) under the tongue every 5 (five) minutes as needed. 06/04/20   Loel Dubonnet, NP  OLANZapine (ZYPREXA) 2.5 MG tablet Take 2.5 mg by mouth at bedtime. 01/19/19   [provider]  OXYGEN Inhale 3 L into the lungs daily.     [provider]  OXYGEN Inhale 2.5 L into the lungs daily.    [provider]  risperiDONE (RISPERDAL) 1 MG tablet Take by mouth.    [provider]  sertraline (ZOLOFT) 50 MG tablet TAKE ONE TABLET BY MOUTH EVERY MORNING FOR 7 DAYS. THEN INCREASE TWO TABLETS EVERY MORNING Patient not taking: Reported on 07/28/2020 05/07/20   [provider]  SPIRIVA HANDIHALER 18 MCG inhalation capsule PLACE 1 CAPSULE IN HANDIHALER, PUNCTURE CAPSULE, THEN INHALE CONTENTS OF CAPSULE BY MOUTH ONCE DAILY 04/22/20   [provider]  sucralfate (CARAFATE) 1 g tablet Take 1 g by mouth 4 (four) times daily. 06/03/20   [provider]    Family History Family History  Problem Relation Age of Onset   Heart attack Brother        Brother #1    Diabetes Brother    Hypertension Brother        #3   Coronary artery disease Father 5       deceased   Heart attack Father    Diabetes Father    Heart disease Father    COPD Mother 20       deceased   Alcohol abuse Sister        polysubstance abuse   COPD Sister    Lung cancer Sister    Alcohol abuse Sister        polysubstance abuse   Penile cancer Brother    Diabetes Brother    Prostate cancer Neg Hx    Bladder Cancer Neg Hx    Kidney cancer Neg Hx     Social History Social History   Tobacco Use   Smoking status: Former Smoker    Packs/day: 1.00    Years: 42.00    Pack years: 42.00    Types: Cigarettes    Quit date: 02/10/2019    Years since quitting: 1.4   Smokeless tobacco: Never Used  Vaping Use   Vaping Use: Never used  Substance Use Topics   Alcohol use: No    Alcohol/week: 0.0 standard drinks   Drug use: No     Allergies   Prozac [fluoxetine hcl], Effexor xr [venlafaxine hcl er], and Wellbutrin [bupropion]   Review of Systems Review of Systems  Constitutional: Positive for fatigue. Negative for fever.  Cardiovascular: Positive for chest pain.  Neurological: Positive for dizziness.   Physical Exam Triage Vital Signs ED Triage Vitals  Enc Vitals Group     BP 07/28/20 1225 132/78     Pulse Rate 07/28/20 1225 75     Resp 07/28/20 1225 (!) 22     Temp 07/28/20 1225 98.1 F (36.7 C)     Temp Source 07/28/20 1225 Oral     SpO2 07/28/20 1225 100 %     Weight 07/28/20 1227 171 lb 8.3 oz (77.8 kg)     Height 07/28/20 1227 5\' 10"  (1.778 m)     Head Circumference --      Peak Flow --      Pain Score 07/28/20  1224 5     Pain Loc --      Pain Edu? --      Excl. in [place]? --    Updated Vital Signs BP 132/78 (BP Location: Left Arm)    Pulse 75    Temp 98.1 F (36.7 C) (Oral)    Resp (!) 22    Ht 5\' 10"  (1.778 m)    Wt 77.8 kg    SpO2 100%    BMI 24.61 kg/m   Visual Acuity Right Eye Distance:   Left Eye Distance:   Bilateral  Distance:    Right Eye Near:   Left Eye Near:    Bilateral Near:     Physical Exam Constitutional:      General: He is not in acute distress.    Comments: Chronically ill-appearing.  HENT:     Head: Normocephalic and atraumatic.  Eyes:     General:        Right eye: No discharge.        Left eye: No discharge.     Conjunctiva/sclera: Conjunctivae normal.  Cardiovascular:     Rate and Rhythm: Normal rate and regular rhythm.  Pulmonary:     Effort: Pulmonary effort is normal.     Comments: Expiratory wheezing Abdominal:     General: There is no distension.     Palpations: Abdomen is soft.     Tenderness: There is no abdominal tenderness.  Neurological:     Mental Status: He is alert.  Psychiatric:        Mood and Affect: Mood normal.        Behavior: Behavior normal.    UC Treatments / Results  Labs (all labs ordered are listed, but only abnormal results are displayed) Labs Reviewed  CBC WITH DIFFERENTIAL/PLATELET - Abnormal; Notable for the following components:      Result Value   Hemoglobin 12.8 (*)    All other components within normal limits  COMPREHENSIVE METABOLIC PANEL  TROPONIN I (HIGH SENSITIVITY)    EKG Interpretation: Sinus rhythm.  Questionable premature supraventricular beats.  Left axis deviation.  Incomplete right bundle branch block.  No acute ST-T changes.  Radiology No results found.  Procedures Procedures (including critical care time)  Medications Ordered in UC Medications - No data to display  Initial Impression / Assessment and Plan / UC Course  I have reviewed the triage vital signs and the nursing notes.  Pertinent labs & imaging results that were available during my care of the patient were reviewed by me and considered in my medical decision making (see chart for details).    69 year old male presents with atypical chest pain and fatigue.  Labs with slight anemia, hemoglobin 12.8.  Troponin negative.  Metabolic panel normal.   Patient is stable and can be discharged home.  Advised rest and supportive care.  Patient has follow-up with cardiology tomorrow.  Final Clinical Impressions(s) / UC Diagnoses   Final diagnoses:  Atypical chest pain  Fatigue, unspecified type     Discharge Instructions     Labs and EKG reassuring.  Rest.  Follow up with cardiology.  Take care  Dr. Lacinda Axon    ED Prescriptions    None     PDMP not reviewed this encounter.   Coral Spikes, [place] 07/28/20 1405

## 2020-07-29 ENCOUNTER — Encounter: Payer: Self-pay | Admitting: Pulmonary Disease

## 2020-07-29 ENCOUNTER — Encounter: Payer: Self-pay | Admitting: Cardiovascular Disease

## 2020-07-29 ENCOUNTER — Ambulatory Visit (INDEPENDENT_AMBULATORY_CARE_PROVIDER_SITE_OTHER): Payer: Medicare Other | Admitting: Pulmonary Disease

## 2020-07-29 ENCOUNTER — Ambulatory Visit (INDEPENDENT_AMBULATORY_CARE_PROVIDER_SITE_OTHER): Payer: Medicare Other | Admitting: Cardiovascular Disease

## 2020-07-29 VITALS — BP 112/70 | HR 53 | Ht 70.0 in | Wt 172.0 lb

## 2020-07-29 VITALS — BP 142/78 | HR 80 | Temp 98.4°F | Ht 70.0 in | Wt 172.2 lb

## 2020-07-29 DIAGNOSIS — J449 Chronic obstructive pulmonary disease, unspecified: Secondary | ICD-10-CM | POA: Diagnosis not present

## 2020-07-29 DIAGNOSIS — Z01810 Encounter for preprocedural cardiovascular examination: Secondary | ICD-10-CM

## 2020-07-29 DIAGNOSIS — I10 Essential (primary) hypertension: Secondary | ICD-10-CM | POA: Diagnosis not present

## 2020-07-29 DIAGNOSIS — I2510 Atherosclerotic heart disease of native coronary artery without angina pectoris: Secondary | ICD-10-CM

## 2020-07-29 DIAGNOSIS — Z87891 Personal history of nicotine dependence: Secondary | ICD-10-CM | POA: Diagnosis not present

## 2020-07-29 DIAGNOSIS — R918 Other nonspecific abnormal finding of lung field: Secondary | ICD-10-CM

## 2020-07-29 DIAGNOSIS — J9611 Chronic respiratory failure with hypoxia: Secondary | ICD-10-CM

## 2020-07-29 DIAGNOSIS — E7800 Pure hypercholesterolemia, unspecified: Secondary | ICD-10-CM | POA: Diagnosis not present

## 2020-07-29 MED ORDER — SPIRIVA RESPIMAT 2.5 MCG/ACT IN AERS
2.0000 | INHALATION_SPRAY | Freq: Every day | RESPIRATORY_TRACT | 0 refills | Status: DC
Start: 2020-07-29 — End: 2020-09-15

## 2020-07-29 NOTE — Patient Instructions (Signed)
We are giving you a trial of Spiriva Respimat 2.5 mcg, 2 puffs once a day.  Do not use the Spiriva HandiHaler (capsule) while you are using the Spiriva Respimat.  Let us know which form you prefer.  Think about consolidating your inhalers and through 1 inhaler this would be Trelegy Ellipta 200.  Your last CT scan looked good.  We will see you in follow-up in 3 months time call sooner should any new problems arise.

## 2020-07-29 NOTE — Patient Instructions (Signed)
Medication Instructions:  Your physician recommends that you continue on your current medications as directed. Please refer to the Current Medication list given to you today.  *If you need a refill on your cardiac medications before your next appointment, please call your pharmacy*   Lab Work: None ordered If you have labs (blood work) drawn today and your tests are completely normal, you will receive your results only by: [name] MyChart Message (if you have MyChart) OR . A paper copy in the mail If you have any lab test that is abnormal or we need to change your treatment, we will call you to review the results.   Testing/Procedures: None ordered   Follow-Up: At [facility], you and your health needs are our priority.  As part of our continuing mission to provide you with exceptional heart care, we have created designated Provider Care Teams.  These Care Teams include your primary Cardiologist (physician) and Advanced Practice Providers (APPs -  Physician Assistants and Nurse Practitioners) who all work together to provide you with the care you need, when you need it.  We recommend signing up for the patient portal called "MyChart".  Sign up information is provided on this After Visit Summary.  MyChart is used to connect with patients for Virtual Visits (Telemedicine).  Patients are able to view lab/test results, encounter notes, upcoming appointments, etc.  Non-urgent messages can be sent to your provider as well.   To learn more about what you can do with MyChart, go to [email or web address].    Your next appointment:   6 month(s)  The format for your next appointment:   In Person  Provider:    You may see Kathlyn Sacramento, MD or one of the following Advanced Practice Providers on your designated Care Team:    Murray Hodgkins, NP  Christell Faith, PA-C  Marrianne Mood, PA-C    Other Instructions Ok to [facility] Aspirin 5 days prior to your prostate biopsy. RESUME when that  physician feels it is safe.

## 2020-07-29 NOTE — Progress Notes (Signed)
Cardiology Office Note   Date:  07/29/2020   ID:  Leslie Sims, DOB 01/23/1951, MRN [id number]  PCP:  [name], MD  Cardiologist:   Kathlyn Sacramento, MD   Chief Complaint  Patient presents with  . Other    Early 3 month follow up. _ needs Clearance for Prostate biopsy - 08/13/2020. Patient c.o some chest pain. Meds reviewed vebally with patient.       History of Present Illness: Leslie Sims is a 69 y.o. male who presents for a follow-up visit regarding coronary artery disease and ischemic cardiomyopathy.  He has known history of severe COPD on home oxygen, prolonged history of tobacco use but quit recently and hyperlipidemia. He is status post anterior myocardial infarction in 2010 with finding of an occluded LAD and a 99% stenosis in the RCA.  He was treated with drug-eluting stent placement to the LAD and RCA at that time.  He subsequently underwent repeat catheterization in November 2017 which showed patent LAD and RCA stents with an EF of 35 to 40%.  He had multiple hospitalizations last year for COPD exacerbation and he was finally able to quit smoking.  No hospitalization since then.  He felt much better since he quit smoking.  He continues to use 2 L nasal cannula oxygen.  He has chronic intermittent atypical chest pain which has not worsened recently.  Yesterday, he was at the oncology office for follow-up regarding his pulmonary nodules.  He complained of some left-sided chest discomfort.  He reports that he slept in an awkward position and had some left shoulder and left-sided chest pain after that.  He was sent to urgent care where his EKG showed no acute changes.  His troponin was normal.  Past Medical History:  Diagnosis Date  . Allergic rhinitis   . Anxiety   . Back pain   . Colon polyps 08/2012   colonoscopy; multiple colon polyps; repeat colonoscopy in one year.  [name] COPD (chronic obstructive pulmonary disease) ([place])    a. on home O2.  . Coronary  artery disease 11/2009   a. late presenting ant MI; b. LHC 100% pLAD s/p PCI/DES, 99% mRCA s/p PCI/DES, EF 35%; c. nuclear stress test 05/13: prior ant/inf infarcts w/o ischemia, EF 43%; d. LHC 02/14: widely patent stents with no other obs dz, EF 40%; e. LHC 11/17: LM nl, mLAD 10%, patent LAD stent, dLAD 20%, p-mRCA 10%, mRCA 40%, patent RCA stent, EF 35-45%   . Depression   . GERD (gastroesophageal reflux disease)   . Headache(784.0)   . Helicobacter pylori (H. pylori)   . Hemorrhoids   . Hernia    inguinal  . HFrEF (heart failure with reduced ejection fraction) ([place])    a. 10/2016 LV gram: EF 35-45%; b. 06/2018 Echo: EF 40-45%.  . Hyperlipidemia   . Hypertension   . Insomnia   . Ischemic cardiomyopathy    a. 10/2016 LV gram: EF 35-45%; b. 06/2018 Echo: EF 40-45%.  . Myalgia   . Peptic ulcer   . Pulmonary nodules   . ST elevation (STEMI) myocardial infarction involving left anterior descending coronary artery ([place]) 11/2009   a. s/p PCI to the LAD  . Status post dilation of esophageal narrowing 2000  . Tinea pedis   . Wears dentures    partial upper    Past Surgical History:  Procedure Laterality Date  . Admission  12/20/2012   COPD exacerbation.  [place].  [name] CARDIAC CATHETERIZATION    .  CARDIAC CATHETERIZATION  02/05/13   [facility]  . CARDIAC CATHETERIZATION  10/14   [facility] : patent stents with no change in anatomy. EF: 40$  . CARDIAC CATHETERIZATION Left 11/12/2016   Procedure: Left Heart Cath and Coronary Angiography;  Surgeon: Wellington Hampshire, MD;  Location: [facility];  Service: Cardiovascular;  Laterality: Left;  . COLONOSCOPY    . COLONOSCOPY WITH PROPOFOL N/A 05/18/2016   Procedure: COLONOSCOPY WITH PROPOFOL;  Surgeon: Lucilla Lame, MD;  Location: [facility] ENDOSCOPY;  Service: Endoscopy;  Laterality: N/A;  . COLONOSCOPY WITH PROPOFOL N/A 03/19/2020   Procedure: COLONOSCOPY WITH PROPOFOL;  Surgeon: Lin Landsman, MD;  Location: [facility] ENDOSCOPY;  Service: Gastroenterology;   Laterality: N/A;  . CORONARY ANGIOPLASTY WITH STENT PLACEMENT  09/2009   LAD 3.0 X23 mm Xience DES, RCA: 4.0 X 15 mm Xience DES  . ELECTROMAGNETIC NAVIGATION BROCHOSCOPY N/A 10/27/2017   Procedure: ELECTROMAGNETIC NAVIGATION BRONCHOSCOPY;  Surgeon: Flora Lipps, MD;  Location: [facility] ORS;  Service: Cardiopulmonary;  Laterality: N/A;  . ESOPHAGEAL DILATION    . ESOPHAGOGASTRODUODENOSCOPY  2008  . ESOPHAGOGASTRODUODENOSCOPY  08/20/2012  . HERNIA REPAIR  07/20/2012   L inguinal hernia repair  . PENILE PROSTHESIS IMPLANT       Current Outpatient Medications  Medication Sig Dispense Refill  . albuterol (PROVENTIL HFA;VENTOLIN HFA) 108 (90 Base) MCG/ACT inhaler Inhale 2 puffs into the lungs every 6 (six) hours as needed for wheezing or shortness of breath. 1 Inhaler 2  . aspirin 81 MG tablet Take 81 mg by mouth daily.      [name] atorvastatin (LIPITOR) 40 MG tablet Take 1 tablet (40 mg total) by mouth daily. 90 tablet 3  . diazepam (VALIUM) 5 MG tablet Take 1 tablet (5 mg total) by mouth once as needed for up to 1 dose (TAKE 30 MINUTES PRIOR TO MRI). 1 tablet 0  . diazepam (VALIUM) 5 MG tablet Take 1 tablet (5 mg total) by mouth once as needed for up to 1 dose (take 30 minutes before prostate biopsy). 1 tablet 0  . FLUoxetine (PROZAC) 20 MG capsule Take by mouth.    . Fluticasone-Salmeterol (ADVAIR) 500-50 MCG/DOSE AEPB Inhale 1 puff into the lungs every 12 (twelve) hours.    . furosemide (LASIX) 20 MG tablet Take 20 mg by mouth daily.     . hydrOXYzine (VISTARIL) 25 MG capsule Take 25 mg by mouth 2 (two) times daily as needed for anxiety or itching.   0  . ipratropium-albuterol (DUONEB) 0.5-2.5 (3) MG/3ML SOLN USE 1 VIAL VIA NEBULIZER AND INHALE BY MOUTH 4 TIMES A DAY 1620 mL 1  . LORazepam (ATIVAN) 1 MG tablet Take 1 tablet (1 mg total) by mouth every 4 (four) hours while awake. 30 tablet 0  . losartan (COZAAR) 25 MG tablet Take 25 mg by mouth daily.    . mirtazapine (REMERON) 15 MG tablet Take 15 mg by  mouth at bedtime.    . Multiple Vitamin (MULTIVITAMIN) capsule Take 1 capsule by mouth daily.    . nitroGLYCERIN (NITROSTAT) 0.4 MG SL tablet Place 1 tablet (0.4 mg total) under the tongue every 5 (five) minutes as needed. 25 tablet 3  . OLANZapine (ZYPREXA) 2.5 MG tablet Take 2.5 mg by mouth at bedtime.    . OXYGEN Inhale 3 L into the lungs daily.     . OXYGEN Inhale 2.5 L into the lungs daily.    . risperiDONE (RISPERDAL) 1 MG tablet Take by mouth.    . sucralfate (  CARAFATE) 1 g tablet Take 1 g by mouth 4 (four) times daily.    . Tiotropium Bromide Monohydrate (SPIRIVA RESPIMAT) 2.5 MCG/ACT AERS Inhale 2 puffs into the lungs daily. 4 g 0   No current facility-administered medications for this visit.    Allergies:   Prozac [fluoxetine hcl], Effexor xr [venlafaxine hcl er], and Wellbutrin [bupropion]    Social History:  The patient  reports that he quit smoking about 17 months ago. His smoking use included cigarettes. He has a 42.00 pack-year smoking history. He has never used smokeless tobacco. He reports that he does not drink alcohol and does not use drugs.   Family History:  The patient's family history includes Alcohol abuse in his sister and sister; COPD in his sister; COPD (age of onset: 83) in his mother; Coronary artery disease (age of onset: 43) in his father; Diabetes in his brother, brother, and father; Heart attack in his brother and father; Heart disease in his father; Hypertension in his brother; Lung cancer in his sister; Penile cancer in his brother.    ROS:  Please see the history of present illness.   Otherwise, review of systems are positive for none.   All other systems are reviewed and negative.    PHYSICAL EXAM: VS:  BP 112/70 (BP Location: Left Arm, Patient Position: Sitting, Cuff Size: Normal)   Pulse (!) 53   Ht 5\' 10"  (1.778 m)   Wt 172 lb (78 kg)   SpO2 98%   BMI 24.68 kg/m  , BMI Body mass index is 24.68 kg/m. GEN: Well nourished, well developed, in no  acute distress  HEENT: normal  Neck: no JVD, carotid bruits, or masses Cardiac: RRR; no murmurs, rubs, or gallops,no edema  Respiratory:  clear to auscultation bilaterally  with diminished breath sounds, normal work of breathing GI: soft, nontender, nondistended, + BS MS: no deformity or atrophy  Skin: warm and dry, no rash Neuro:  Strength and sensation are intact Psych: euthymic mood, full affect  EKG:  EKG is not ordered today. I reviewed his EKG done yesterday at the ED which showed sinus rhythm with incomplete right bundle branch block.  Recent Labs: 07/28/2020: ALT 19; BUN 10; Creatinine, Ser 0.91; Hemoglobin 12.8; Platelets 226; Potassium 3.9; Sodium 138    Lipid Panel    Component Value Date/Time   CHOL 143 07/27/2017 1525   TRIG 72 07/27/2017 1525   TRIG 65 01/15/2010 0000   HDL 62 07/27/2017 1525   CHOLHDL 2.3 07/27/2017 1525   CHOLHDL 1.8 08/24/2016 1344   VLDL 25 08/24/2016 1344   LDLCALC 67 07/27/2017 1525      Wt Readings from Last 3 Encounters:  07/29/20 172 lb (78 kg)  07/29/20 172 lb 3.2 oz (78.1 kg)  07/28/20 171 lb 8.3 oz (77.8 kg)        ASSESSMENT AND PLAN:  1.  Coronary artery disease involving native coronary arteries without angina: Status post prior anterior infarct with drug-eluting stent placement to the LAD and RCA in 2010.  Catheterization 2017 showed stable anatomy.  He has known history of intermittent atypical chest pain.  This does not seem to be different from before and does not require further investigation at the present time.  2.    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  Continue small dose losartan.  He did not tolerate beta-blockers in the past due to lung disease.  3.  Hyperlipidemia: Continue treatment with atorvastatin.  Recommend a target LDL of less than 70.  4.  Severe COPD: Followed by pulmonary: He is on home oxygen.  5.   Preop cardiovascular evaluation for prostate biopsy: The patient does not require further ischemic cardiac evaluation.  He can proceed at moderate risk considering his extensive cardiac and pulmonary disease.  Aspirin can be held 5 days before and should be resumed as soon as safe from a bleeding standpoint.   Disposition:   FU with me in 6 months  Signed,  Kathlyn Sacramento, MD  07/29/2020 4:42 PM    [place]

## 2020-07-29 NOTE — Progress Notes (Signed)
Subjective:    Patient ID: Leslie Sims, male    DOB: 18-Aug-1951, 69 y.o.   MRN: [id number] Requesting MD/Service:Kristi Tamala Julian, MD Date of initial consultation:08/22/17by Dr. [name] Reason for consultation:COPD, smoker  PT PROFILE: 64 Mformer smoker quit 02/22/2020with severe emphysema seen by multiple pulmonologists in past ([place], [place], [place], [place]) referred for further eval and mgmt of COPD. Previously seen by Dr. [name] I have assumed care after Dr. Lora Havens departure.  DATA: 07/24/12 Office Spirometry: Severe obstruction (FEV1 31% pred) 08/31/13 Office Spirometry: very severe obstruction (FEV1 25% pred) 06/18/15 CT chest:Severe emphysema 03/03/17 Overnight oximetry: Lowest SpO2 72%. 95% of time SpO2 was below 90% 03/03/17 6MWT: 384 meters. Desat to 84% 10/04/17 CT chest: Two new irregularly marginated nodules, one in the RUL, and a second in the mid LUL worrisome for either synchronous lung ca or metastatic involvement of the lungs. The two small nodules described on the prior dictated report have resolved in the left superior segment and most likely were postinflammatory. Diffuse centrilobular emphysema. 10/13/17 PET scan: The new left upper lobe nodule is FDG avid consistent with malignancy. Recommend tissue confirmation. The new nodule in the right upper lobe demonstrates low level uptake. While the level of uptake suggests the possibility of an inflammatory or infectious process, a low-grade malignancy is not excluded. Given the suspected malignancy on the left, tissue confirmation should be considered on the right despite the low level of uptake. No distant metastatic disease identified 10/27/17 ENB ([place]):Transbronchial Fine Needle Aspirations 21G X7, Transbronchial Forceps Biopsy X6. ALVEOLATED LUNG TISSUE WITH HEMOSIDERIN LADEN MACROPHAGES. NEGATIVE FOR ATYPIA AND MALIGNANCY 11/01/17 PFTs:FVC: 2.96 >3.14 L (68 >72 %pred), FEV1: 0.94 >0.93 L (27  %pred), FEV1/FVC: 32%, TLC: 9.04 L (137 %pred), DLCO 62 %pred  12/07/17 CTA chest: No pulmonary embolus identified. Anterior left upper lobe ground-glass and nodular consolidation likely representing pneumonitis. Stable bilateral upper lobe pulmonary nodules. Stable emphysema. 01/21/18 CT chest: new masslike architectural distortion and ground-glass attenuation surrounding the index nodule within the RUL. The appearance favors an inflammatory or infectious process. The index nodule in the left upper lobe is slightly decreased in volume when compared with the previous exam favoring a benign process. Attention on follow-up imaging advise. Advanced changes of emphysema (very severe) 06/01/18 CTA chest:No evidence of pulmonary embolism. Waxing and waning bilateral pulmonary nodular process as described. New 8 mm nodular ill-defined focus of opacification over the lingula which may represent an acute inflammatory or infectious process. No effusion. Emphysema  10/19/18 CTA chest:No acute cardiopulmonary disease and no evidence of pulm embolism. Hospitalization 1/12-1/14/2020: COPD exacerbation Hospitalization 2/22-2/27/20: COPD exacerbation, HCAP 02/11/19 CT chest:Progressed masslike consolidation within the RUL in the region of previous waxing and waning nodule, this is contiguous with the pleural surface and demonstrates spiculated margin and probable small focus of central cavitation. Findings could be secondary to infection superimposed on chronic nodule. However cavitary neoplasm is also a concern. Multiple additional lung nodules which have not significantly changed. Small foci of ground-glass density in the RUL may reflect small focus of infection. Mod-severe emphysema 05/01/19 CT chest:Waxing/waning right lung opacities, suggesting infection, including atypical/mycobacterial infection. Improvement of the prior dominant masslike opacity argues against neoplasm. Stable left upper lobe nodule, likely  benign. Aortic Atherosclerosis and Emphysema 11/02/2019 CT chest: Multiple larger pulmonary nodules and masslike areas seen on the prior study have largely regressed.  Findings indicative of infectious/inflammatory process.  Diffuse bronchial wall thickening with moderate centrilobular and paraseptal emphysema.  Suggestion of left main and three-vessel  coronary artery disease. 04/23/2020 CT chest: "Multiple bilateral stable pulmonary nodules. New 7 mm nodule over the posterior left upper lobe. Stable nodularity over an area ofright upper lobe/apical scarring with new 9 mm nodular focus along the most inferior aspect of this scarring. This area has shown to wax and wane on previous exams and is most likely inflammatory". 07/24/2020 CT chest: "Background of marked pulmonary emphysema with stable post infectious or inflammatory changes and with near complete resolution of a presumably infectious or inflammatory nodule in the LEFT upper lobe as compared to previous exam".  INTERVAL: Last seen by me on 04/21/2020.   1 visits to ED due to chest pain.    No recent COPD exacerbations.  Remains abstinent of tobacco.  HPI Patient is a 69 year old former smoker (quit 2020) who follows here for the issue of COPD and chronic respiratory failure with hypoxia.  Patient is well compensated on 2 L/min nasal cannula O2.  Since his last visit in May he has not had any exacerbations of COPD.  He remains abstinent of tobacco.  He is currently on Advair 500/50 and Spiriva HandiHaler.  He is very reluctant to change inhalers.  He is willing to try the Spiriva Respimat to see if this delivery method is better for him.  Dyspnea is about baseline.  He does feel that he can do much more than he did at his last visit.  He follows here also for evanescent pulmonary nodules.  Most recent chest CT on 5 August did not show any new nodules and if anything it was improved.  He does have severe emphysema.  He has not been able to complete  PFTs.  Would like to defer till next visit.  He has been doing well with a portable concentrator and has been compliant with oxygen use.  He notes marked improvement in his respiratory status since he has been more consistent in using his oxygen.  No fevers, chills or sweats.  No weight loss or anorexia.  He voices no other complaint.   Review of Systems A 10 point review of systems was performed and it is as noted above otherwise negative.    Objective:   Physical Exam BP (!) 142/78 (BP Location: Left Arm, Cuff Size: Normal)    Pulse 80    Temp 98.4 F (36.9 C) (Temporal)    Ht 5\' 10"  (1.778 m)    Wt 172 lb 3.2 oz (78.1 kg)    SpO2 94%    BMI 24.71 kg/m  GENERAL: Well-developed well-nourished gentleman, chronic use of accessories, no conversational dyspnea.  No acute distress.    Fully ambulatory. Using oxygen portable concentrator at 3 L pulsed. HEAD: Normocephalic, atraumatic.  EYES: Pupils equal, round, reactive to light.  No scleral icterus.  MOUTH: Nose/mouth/throat not examined due to masking requirements for COVID 19. NECK: Supple. No thyromegaly. No nodules. No JVD.  Trachea midline, no crepitus. PULMONARY: Very distant breath sounds, rare end expiratory wheeze.  No other adventitious sounds.[name] CARDIOVASCULAR: S1 and S2. Regular rate and rhythm.  No rubs murmurs or gallops heard. GASTROINTESTINAL: Slightly protuberant abdomen, soft, benign. MUSCULOSKELETAL: No joint deformity, no clubbing, no edema.  NEUROLOGIC: Awake, alert, oriented.  No overt focal deficits.  Speech is fluent. SKIN: Intact,warm,dry.  Limited exam: No rashes. PSYCH: Normal mood and behavior.  Representative slice from CT scan of the chest obtained August 2021:      Assessment & Plan:     ICD-10-CM   1. Stage 4 very  severe COPD by GOLD classification (HCC)  J44.9    Continue Advair and Spiriva Trial of Spiriva Respimat to substitute HandiHaler Follow-up 3 months time  2. Chronic hypoxemic respiratory  failure (HCC)  J96.11    Continue oxygen at 3 L/min  3. Bilateral pulmonary opacities  R91.8    Follow-up CT 6 months  4. Former smoker  Z87.891    Commended on discontinuation of smoking   Meds ordered this encounter  Medications   Tiotropium Bromide Monohydrate (SPIRIVA RESPIMAT) 2.5 MCG/ACT AERS    Sig: Inhale 2 puffs into the lungs daily.    Dispense:  4 g    Refill:  0    Order Specific Question:   Lot Number?    Answer:   [id number]    Order Specific Question:   Expiration Date?    Answer:   10/20/2021    Order Specific Question:   Manufacturer?    Answer:   [employer] [[id number]]    Order Specific Question:   Quantity    Answer:   2    Smoking cessation instruction/counseling given:  commended patient for quitting and reviewed strategies for preventing relapses  Follow-up 3 months, call sooner should any new problems arise.   Renold Don, MD [facility]   *This note was dictated using voice recognition software/Dragon.  Despite best efforts to proofread, errors can occur which can change the meaning.  Any change was purely unintentional.

## 2020-07-30 ENCOUNTER — Other Ambulatory Visit: Payer: Self-pay | Admitting: Urology

## 2020-07-31 ENCOUNTER — Telehealth: Payer: Self-pay | Admitting: Pulmonary Disease

## 2020-07-31 NOTE — Telephone Encounter (Signed)
Patient was instructed at last OV to call with name of antidepressant, as he could not recall the name.  prozac has been added to medication list.  Nothing further is needed.

## 2020-08-05 ENCOUNTER — Telehealth: Payer: Self-pay

## 2020-08-05 NOTE — Telephone Encounter (Signed)
Per Dr. Fletcher Anon pt ok to d/c ASA x 5 days prior to Biopsy. (See encounter from 07/29/20) Called pt reiterated this information. Pt gave verbal understanding.

## 2020-08-06 ENCOUNTER — Ambulatory Visit: Payer: Self-pay | Admitting: Urology

## 2020-08-13 ENCOUNTER — Other Ambulatory Visit: Payer: Self-pay

## 2020-08-13 ENCOUNTER — Ambulatory Visit (INDEPENDENT_AMBULATORY_CARE_PROVIDER_SITE_OTHER): Payer: Medicare Other | Admitting: Urology

## 2020-08-13 ENCOUNTER — Encounter: Payer: Self-pay | Admitting: Urology

## 2020-08-13 VITALS — BP 106/65 | HR 85 | Ht 70.0 in | Wt 172.0 lb

## 2020-08-13 DIAGNOSIS — R972 Elevated prostate specific antigen [PSA]: Secondary | ICD-10-CM

## 2020-08-13 DIAGNOSIS — R918 Other nonspecific abnormal finding of lung field: Secondary | ICD-10-CM | POA: Diagnosis not present

## 2020-08-13 MED ORDER — GENTAMICIN SULFATE 40 MG/ML IJ SOLN
80.0000 mg | Freq: Once | INTRAMUSCULAR | Status: AC
  Administered 2020-08-13: 80 mg via INTRAMUSCULAR

## 2020-08-13 MED ORDER — LEVOFLOXACIN 500 MG PO TABS
500.0000 mg | ORAL_TABLET | Freq: Once | ORAL | Status: AC
  Administered 2020-08-13: 500 mg via ORAL

## 2020-08-13 NOTE — Progress Notes (Signed)
   08/13/20  Indication: Elevated PSA 4.3, PIRADS 4 lesion on MRI at left lateral base  Prostate Biopsy Procedure   Informed consent was obtained, and we discussed the risks of bleeding and infection/sepsis. A time out was performed to ensure correct patient identity.  Pre-Procedure: - Last PSA Level: 4.3 - Gentamicin and levaquin given for antibiotic prophylaxis - Transrectal Ultrasound performed revealing a 39 gm prostate, PSA density 0.11 - Hypoechoic region at left lateral base correlating with MRI PIRADS 4 lesion  Procedure: - Prostate block performed using 10 cc 1% lidocaine and biopsies taken from sextant areas, a total of 14 under ultrasound guidance. Two additional cores from the typical 12 were taken at the hypoechoic region at the left lateral base and mid prostate  Post-Procedure: - Patient tolerated the procedure well - He was counseled to seek immediate medical attention if experiences significant bleeding, fevers, or severe pain - Return in one week to discuss biopsy results  Assessment/ Plan: Will follow up in 1-2 weeks to discuss pathology  Nickolas Madrid, MD 08/13/2020

## 2020-08-13 NOTE — Patient Instructions (Signed)
Transrectal Ultrasound-Guided Prostate Biopsy, Care After This sheet gives you information about how to care for yourself after your procedure. Your doctor may also give you more specific instructions. If you have problems or questions, contact your doctor. What can I expect after the procedure? After the procedure, it is common to have:  Pain and discomfort in your butt, especially while sitting.  Pink-colored pee (urine), due to small amounts of blood in the pee.  Burning while peeing (urinating).  Blood in your poop (stool).  Bleeding from your butt.  Blood in your semen. Follow these instructions at home: Medicines  Take over-the-counter and prescription medicines only as told by your doctor.  If you were prescribed antibiotic medicine, take it as told by your doctor. Do not stop taking the antibiotic even if you start to feel better. Activity   Do not drive for 24 hours if you were given a medicine to help you relax (sedative) during your procedure.  Return to your normal activities as told by your doctor. Ask your doctor what activities are safe for you.  Ask your doctor when it is okay for you to have sex.  Do not lift anything that is heavier than 10 lb (4.5 kg), or the limit that you are told, until your doctor says that it is safe. General instructions   Drink enough water to keep your pee pale yellow.  Watch your pee, poop, and semen for new bleeding or bleeding that gets worse.  Keep all follow-up visits as told by your doctor. This is important. Contact a doctor if you:  Have blood clots in your pee or poop.  Notice that your pee smells bad or unusual.  Have very bad belly pain.  Have trouble peeing.  Notice that your lower belly feels firm.  Have blood in your pee for more than 2 weeks after the procedure.  Have blood in your semen for more than 2 months after the procedure.  Have problems getting an erection.  Feel sick to your stomach  (nauseous).  Throw up (vomit).  Have new or worse bleeding in your pee, poop, or semen. Get help right away if you:  Have a fever or chills.  Have bright red pee.  Have very bad pain that does not get better with medicine.  Cannot pee. Summary  After this procedure, it is common to have pain and discomfort around your butt, especially while sitting.  You may have blood in your pee and poop.  It is common to have blood in your semen for 1-2 months.  If you were prescribed antibiotic medicine, take it as told by your doctor. Do not stop taking the antibiotic even if you start to feel better.  Get help right away if you have a fever or chills. This information is not intended to replace advice given to you by your health care provider. Make sure you discuss any questions you have with your health care provider. Document Revised: 03/28/2019 Document Reviewed: 10/04/2017 Elsevier Patient Education  2020 [employer]  

## 2020-08-15 LAB — SURGICAL PATHOLOGY

## 2020-08-19 ENCOUNTER — Other Ambulatory Visit: Payer: Self-pay

## 2020-08-19 ENCOUNTER — Ambulatory Visit (INDEPENDENT_AMBULATORY_CARE_PROVIDER_SITE_OTHER): Payer: Medicare Other | Admitting: Urology

## 2020-08-19 ENCOUNTER — Encounter: Payer: Self-pay | Admitting: Urology

## 2020-08-19 VITALS — BP 114/76 | HR 92 | Ht 70.0 in | Wt 173.0 lb

## 2020-08-19 DIAGNOSIS — C61 Malignant neoplasm of prostate: Secondary | ICD-10-CM | POA: Diagnosis not present

## 2020-08-19 DIAGNOSIS — R972 Elevated prostate specific antigen [PSA]: Secondary | ICD-10-CM

## 2020-08-19 NOTE — Progress Notes (Signed)
08/19/2020 3:47 PM   Leslie Sims Aug 22, 1951 [id number]  Reason for visit: Discuss prostate biopsy results  HPI: I saw [name] back in urology clinic to review his prostate biopsy results.  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  With his co-morbidities, we opted for a prostate MRI prior to pursuing prostate biopsy, and this showed a 40 g prostate with a 1.6 cm PI-RADS 4 lesion of the left posterior lateral peripheral zone of the prostate base extending into the mid gland.  He underwent a cognitive guided transrectal ultrasound biopsy on 08/13/2020 that showed 7/12 cores positive for prostate cancer, including 4 cores of Gleason score 3+4=7 prostate adenocarcinoma, max core involvement of 80%, and perineural invasion was present.  We had a lengthy conversation today about the patient's new diagnosis of prostate cancer.  We reviewed the risk classifications per the AUA guidelines including very low risk, low risk, intermediate risk, and high risk disease, and the need for additional staging imaging with CT and bone scan in patients with unfavorable intermediate risk and high risk disease.  I explained that his life expectancy, clinical stage, Gleason score, PSA, and other co-morbidities influence treatment strategies.  We discussed the roles of active surveillance, radiation therapy, surgical therapy with robotic prostatectomy, and hormone therapy with androgen deprivation.  We discussed that patients urinary symptoms also impact treatment strategy, as patients with severe lower urinary tract symptoms may have significant worsening or even develop urinary retention after undergoing radiation.  In regards to surgery, we discussed  robotic prostatectomy +/- lymphadenectomy at length.  The procedure takes 3 to 4 hours, and patient's typically discharge home on post-op day #1.  A Foley catheter is left in place for 7 to 10 days to allow for healing of the vesicourethral anastomosis.  There is a small risk of bleeding, infection, damage to surrounding structures or bowel, hernia, DVT/PE, or serious cardiac or pulmonary complications.  We discussed at length post-op side effects including erectile dysfunction, and the importance of pre-operative erectile function on long-term outcomes.  Even with a nerve sparing approach, there is an approximately 25% rate of permanent erectile dysfunction.  We also discussed postop urinary incontinence at length.  We expect patients to have stress incontinence post-operatively that will improve over period of weeks to months.  Less than 10% of men will require a pad at 1 year after surgery.  Patients will need to avoid heavy lifting and strenuous activity for 3 to 4 weeks, but most men return to their baseline activity status by 6 weeks.  In summary, he is a very comorbid 69 year old male with new diagnosis of favorable intermediate risk prostate cancer.  I was very frank with the patient that with his co-morbidities of CAD and COPD on oxygen, he is not a good candidate for robotic prostatectomy, especially with the Trendelenburg position needed which would put him at high risk for pulmonary complications.  I recommended pursuing radiation, and a referral was placed to Dr. Baruch Gouty with radiation oncology  I spent 30 total minutes on the day of the encounter including pre-visit review of the medical record, face-to-face time with the patient, and post visit ordering of labs/imaging/tests.   Billey Co, MD  [facility] [address]  [address] [place], [place] [address] [phone]

## 2020-08-19 NOTE — Patient Instructions (Signed)
Prostate Cancer  The prostate is a walnut-sized gland that is involved in the production of semen. It is located below a man's bladder, in front of the rectum. Prostate cancer is the abnormal growth of cells in the prostate gland. What are the causes? The exact cause of this condition is not known. What increases the risk? This condition is more likely to develop in men who:  Are older than age 67.  Are African-American.  Are obese.  Have a family history of prostate cancer.  Have a family history of breast cancer. What are the signs or symptoms? Symptoms of this condition include:  A need to urinate often.  Weak or interrupted flow of urine.  Trouble starting or stopping urination.  Inability to urinate.  Pain or burning during urination.  Painful ejaculation.  Blood in urine or semen.  Persistent pain or discomfort in the lower back, lower abdomen, hips, or upper thighs.  Trouble getting an erection.  Trouble emptying the bladder all the way. How is this diagnosed? This condition can be diagnosed with:  A digital rectal exam. For this exam, a health care provider inserts a gloved finger into the rectum to feel the prostate gland.  A blood test called a prostate-specific antigen (PSA) test.  An imaging test called transrectal ultrasonography.  A procedure in which a sample of tissue is taken from the prostate and examined under a microscope (prostate biopsy). Once the condition is diagnosed, tests will be done to determine how far the cancer has spread. This is called staging the cancer. Staging may involve imaging tests, such as:  A bone scan.  A CT scan.  A PET scan.  An MRI. The stages of prostate cancer are as follows:  Stage I. At this stage, the cancer is found in the prostate only. The cancer is not visible on imaging tests and it is usually found by accident, such as during a prostate surgery.  Stage II. At this stage, the cancer is more advanced  than it is in stage I, but the cancer has not spread outside the prostate.  Stage III. At this stage, the cancer has spread beyond the outer layer of the prostate to nearby tissues. The cancer may be found in the seminal vesicles, which are near the bladder and the prostate.  Stage IV. At this stage, the cancer has spread other parts of the body, such as the lymph nodes, bones, bladder, rectum, liver, or lungs. How is this treated? Treatment for this condition depends on several factors, including the stage of the cancer, your age, personal preferences, and your overall health. Talk with your health care provider about treatment options that are recommended for you. Common treatments include:  Observation for early stage prostate cancer (active surveillance). This involves having exams, blood tests, and in some cases, more biopsies. For some men, this is the only treatment needed.  Surgery. Types of surgeries include: ? Open surgery. In this surgery, a larger incision is made to remove the prostate. ? A laparoscopic prostatectomy. This is a surgery to remove the prostate and lymph nodes through several, small incisions. It is often referred to as a minimally invasive surgery. ? A robotic prostatectomy. This is a surgery to remove the prostate and lymph nodes with the help of a robotic arm that is controlled by a computer. ? Orchiectomy. This is a surgery to remove the testicles. ? Cryosurgery. This is a surgery to freeze and destroy cancer cells.  Radiation treatment. Types   of radiation treatment include: ? External beam radiation. This type aims beams of radiation from outside the body at the prostate to destroy cancerous cells. ? Brachytherapy. This type uses radioactive needles, seeds, wires, or tubes that are implanted into the prostate gland. Like external beam radiation, brachytherapy destroys cancerous cells. An advantage is that this type of radiation limits the damage to surrounding  tissue and has fewer side effects.  High-intensity, focused ultrasonography. This treatment destroys cancer cells by delivering high-energy ultrasound waves to the cancerous cells.  Chemotherapy medicines. This treatment kills cancer cells or stops them from multiplying.  Hormone treatment. This treatment involves taking medicines that act on one of the male hormones (testosterone): ? By stopping your body from producing testosterone. ? By blocking testosterone from reaching cancer cells. Follow these instructions at home:  Take over-the-counter and prescription medicines only as told by your health care provider.  Maintain a healthy diet.  Get plenty of sleep.  Consider joining a support group for men who have prostate cancer. Meeting with a support group may help you learn to cope with the stress of having cancer.  Keep all follow-up visits as told by your health care provider. This is important.  If you have to go to the hospital, notify your cancer specialist (oncologist).  Treatment for prostate cancer may affect sexual function. Continue to have intimate moments with your partner. This may include touching, holding, hugging, and caressing. Contact a health care provider if:  You have trouble urinating.  You have blood in your urine.  You have pain in your hips, back, or chest. Get help right away if:  You have weakness or numbness in your legs.  You cannot control urination or your bowel movements (incontinence).  You have trouble breathing.  You have sudden chest pain.  You have chills or a fever. Summary  The prostate is a walnut-sized gland that is involved in the production of semen. It is located below a man's bladder, in front of the rectum. Prostate cancer is the abnormal growth of cells in the prostate gland.  Treatment for this condition depends on several factors, including the stage of the cancer, your age, personal preferences, and your overall health.  Talk with your health care provider about treatment options that are recommended for you.  Consider joining a support group for men who have prostate cancer. Meeting with a support group may help you learn to cope with the stress of having cancer. This information is not intended to replace advice given to you by your health care provider. Make sure you discuss any questions you have with your health care provider. Document Revised: 11/18/2017 Document Reviewed: 08/16/2016 Elsevier Patient Education  2020 Elsevier Inc.  

## 2020-09-01 ENCOUNTER — Encounter: Payer: Self-pay | Admitting: Radiation Oncology

## 2020-09-01 ENCOUNTER — Ambulatory Visit
Admission: RE | Admit: 2020-09-01 | Discharge: 2020-09-01 | Disposition: A | Discharge status: Home / Self Care | Payer: Medicare Other | Source: Ambulatory Visit | Attending: Radiation Oncology | Admitting: Radiation Oncology

## 2020-09-01 ENCOUNTER — Other Ambulatory Visit: Payer: Self-pay

## 2020-09-01 VITALS — BP 135/72 | HR 90 | Temp 98.0°F | Wt 173.6 lb

## 2020-09-01 DIAGNOSIS — F418 Other specified anxiety disorders: Secondary | ICD-10-CM | POA: Diagnosis not present

## 2020-09-01 DIAGNOSIS — Z86010 Personal history of colonic polyps: Secondary | ICD-10-CM | POA: Diagnosis not present

## 2020-09-01 DIAGNOSIS — I2510 Atherosclerotic heart disease of native coronary artery without angina pectoris: Secondary | ICD-10-CM | POA: Diagnosis not present

## 2020-09-01 DIAGNOSIS — Z79899 Other long term (current) drug therapy: Secondary | ICD-10-CM | POA: Insufficient documentation

## 2020-09-01 DIAGNOSIS — Z87891 Personal history of nicotine dependence: Secondary | ICD-10-CM | POA: Insufficient documentation

## 2020-09-01 DIAGNOSIS — I110 Hypertensive heart disease with heart failure: Secondary | ICD-10-CM | POA: Diagnosis not present

## 2020-09-01 DIAGNOSIS — Z8711 Personal history of peptic ulcer disease: Secondary | ICD-10-CM | POA: Insufficient documentation

## 2020-09-01 DIAGNOSIS — Z8719 Personal history of other diseases of the digestive system: Secondary | ICD-10-CM | POA: Insufficient documentation

## 2020-09-01 DIAGNOSIS — J449 Chronic obstructive pulmonary disease, unspecified: Secondary | ICD-10-CM | POA: Diagnosis not present

## 2020-09-01 DIAGNOSIS — I252 Old myocardial infarction: Secondary | ICD-10-CM | POA: Insufficient documentation

## 2020-09-01 DIAGNOSIS — M549 Dorsalgia, unspecified: Secondary | ICD-10-CM | POA: Insufficient documentation

## 2020-09-01 DIAGNOSIS — Z9981 Dependence on supplemental oxygen: Secondary | ICD-10-CM | POA: Diagnosis not present

## 2020-09-01 DIAGNOSIS — C61 Malignant neoplasm of prostate: Secondary | ICD-10-CM | POA: Diagnosis present

## 2020-09-01 DIAGNOSIS — Z7982 Long term (current) use of aspirin: Secondary | ICD-10-CM | POA: Diagnosis not present

## 2020-09-01 DIAGNOSIS — I255 Ischemic cardiomyopathy: Secondary | ICD-10-CM | POA: Insufficient documentation

## 2020-09-01 DIAGNOSIS — E785 Hyperlipidemia, unspecified: Secondary | ICD-10-CM | POA: Diagnosis not present

## 2020-09-01 DIAGNOSIS — R351 Nocturia: Secondary | ICD-10-CM | POA: Insufficient documentation

## 2020-09-01 NOTE — Consult Note (Signed)
NEW PATIENT EVALUATION  Name: Leslie Sims  MRN: [id number]  Date:   09/01/2020     DOB: 14-Mar-1951   This 69 y.o. 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  REFERRING PHYSICIAN: [name], MD  CHIEF COMPLAINT:  Chief Complaint  Patient presents with  . Consult    DIAGNOSIS: The encounter diagnosis was Prostate cancer ([place]).   PREVIOUS INVESTIGATIONS:  Pathology report reviewed MRI scan reviewed Clinical notes reviewed  HPI: Patient is a 69 year old male with multiple comorbidities including extensive CAD, COPD oxygen dependent.  He has been followed with an elevated PSA over the past 6 months most recently 4.5.  He underwent a prostate MRI scan showing a PI-RADS 4 lesion left posterior lateral peripheral zone extending slightly into the mid gland.  He also had a PI-RADS category 3 lesion in the bilateral transitional zone.  This prompted transrectal ultrasound-guided biopsy showing 7 of 12 cores positive for adenocarcinoma mostly Gleason 7 (3+4).  Patient does have significant frequency and urgency in the morning hours of unknown etiology.  He only has nocturia x2-3 at.  He specifically denies any diarrhea or GI complaints.  He is on oxygen continuously.  He has been seen by urology with recommendation for external beam treatment based on his medical comorbidities.  He is seen today for radiation oncology opinion.  He specifically denies bone pain.  MRI scan shows no evidence to suggest extracapsular extension.  PLANNED TREATMENT REGIMEN: Image guided IMRT radiation therapy  PAST MEDICAL HISTORY:  has a past medical history of Allergic rhinitis, Anxiety, Back pain, Colon polyps (08/2012), COPD (chronic obstructive pulmonary disease) ([place]), Coronary artery disease (11/2009), Depression, GERD (gastroesophageal reflux disease), SWFUXNAT(557.3),  Helicobacter pylori (H. pylori), Hemorrhoids, Hernia, HFrEF (heart failure with reduced ejection fraction) ([place]), Hyperlipidemia, Hypertension, Insomnia, Ischemic cardiomyopathy, Myalgia, Peptic ulcer, Pulmonary nodules, ST elevation (STEMI) myocardial infarction involving left anterior descending coronary artery ([place]) (11/2009), Status post dilation of esophageal narrowing (2000), Tinea pedis, and Wears dentures.    PAST SURGICAL HISTORY:  Past Surgical History:  Procedure Laterality Date  . Admission  12/20/2012   COPD exacerbation.  [place].  [name] CARDIAC CATHETERIZATION    . CARDIAC CATHETERIZATION  02/05/13   [facility]  . CARDIAC CATHETERIZATION  10/14   [facility] : patent stents with no change in anatomy. EF: 40$  . CARDIAC CATHETERIZATION Left 11/12/2016   Procedure: Left Heart Cath and Coronary Angiography;  Surgeon: Wellington Hampshire, MD;  Location: [place] [facility];  Service: Cardiovascular;  Laterality: Left;  . COLONOSCOPY    . COLONOSCOPY WITH PROPOFOL N/A 05/18/2016   Procedure: COLONOSCOPY WITH PROPOFOL;  Surgeon: Lucilla Lame, MD;  Location: [facility] ENDOSCOPY;  Service: Endoscopy;  Laterality: N/A;  . COLONOSCOPY WITH PROPOFOL N/A 03/19/2020   Procedure: COLONOSCOPY WITH PROPOFOL;  Surgeon: Lin Landsman, MD;  Location: [facility] ENDOSCOPY;  Service: Gastroenterology;  Laterality: N/A;  . CORONARY ANGIOPLASTY WITH STENT PLACEMENT  09/2009   LAD 3.0 X23 mm Xience DES, RCA: 4.0 X 15 mm Xience DES  . ELECTROMAGNETIC NAVIGATION BROCHOSCOPY N/A 10/27/2017   Procedure: ELECTROMAGNETIC NAVIGATION BRONCHOSCOPY;  Surgeon: Flora Lipps, MD;  Location: [facility] ORS;  Service: Cardiopulmonary;  Laterality: N/A;  . ESOPHAGEAL DILATION    . ESOPHAGOGASTRODUODENOSCOPY  2008  . ESOPHAGOGASTRODUODENOSCOPY  08/20/2012  . HERNIA REPAIR  07/20/2012   L inguinal hernia repair  . PENILE PROSTHESIS IMPLANT  FAMILY HISTORY: family history includes Alcohol abuse in his sister and sister; COPD in his sister; COPD (age of  onset: 72) in his mother; Coronary artery disease (age of onset: 42) in his father; Diabetes in his brother, brother, and father; Heart attack in his brother and father; Heart disease in his father; Hypertension in his brother; Lung cancer in his sister; Penile cancer in his brother.  SOCIAL HISTORY:  reports that he quit smoking about 18 months ago. His smoking use included cigarettes. He has a 42.00 pack-year smoking history. He has never used smokeless tobacco. He reports that he does not drink alcohol and does not use drugs.  ALLERGIES: Prozac [fluoxetine hcl], Effexor xr [venlafaxine hcl er], and Wellbutrin [bupropion]  MEDICATIONS:  Current Outpatient Medications  Medication Sig Dispense Refill  . albuterol (PROVENTIL HFA;VENTOLIN HFA) 108 (90 Base) MCG/ACT inhaler Inhale 2 puffs into the lungs every 6 (six) hours as needed for wheezing or shortness of breath. 1 Inhaler 2  . aspirin 81 MG tablet Take 81 mg by mouth daily.      [name] atorvastatin (LIPITOR) 40 MG tablet Take 1 tablet (40 mg total) by mouth daily. (Patient taking differently: Take 40 mg by mouth daily. Taking 3 pills daily) 90 tablet 3  . FLUoxetine (PROZAC) 20 MG capsule Take 2 capsules by mouth daily.    . Fluticasone-Salmeterol (ADVAIR) 500-50 MCG/DOSE AEPB Inhale 1 puff into the lungs every 12 (twelve) hours.    . furosemide (LASIX) 20 MG tablet Take 20 mg by mouth daily.     . hydrOXYzine (VISTARIL) 25 MG capsule Take 25 mg by mouth 2 (two) times daily as needed for anxiety or itching.   0  . ipratropium-albuterol (DUONEB) 0.5-2.5 (3) MG/3ML SOLN USE 1 VIAL VIA NEBULIZER AND INHALE BY MOUTH 4 TIMES A DAY 1620 mL 1  . LORazepam (ATIVAN) 1 MG tablet Take 1 tablet (1 mg total) by mouth every 4 (four) hours while awake. 30 tablet 0  . losartan (COZAAR) 25 MG tablet Take 25 mg by mouth daily.    . mirtazapine (REMERON) 15 MG tablet Take 15 mg by mouth at bedtime.    . Multiple Vitamin (MULTIVITAMIN) capsule Take 1 capsule by mouth  daily.    . nitroGLYCERIN (NITROSTAT) 0.4 MG SL tablet Place 1 tablet (0.4 mg total) under the tongue every 5 (five) minutes as needed. 25 tablet 3  . OLANZapine (ZYPREXA) 2.5 MG tablet Take 2.5 mg by mouth at bedtime.    . OXYGEN Inhale 3 L into the lungs daily.     . OXYGEN Inhale 2.5 L into the lungs daily.    . risperiDONE (RISPERDAL) 1 MG tablet Take by mouth.    . sucralfate (CARAFATE) 1 g tablet Take 1 g by mouth 4 (four) times daily.    . Tiotropium Bromide Monohydrate (SPIRIVA RESPIMAT) 2.5 MCG/ACT AERS Inhale 2 puffs into the lungs daily. 4 g 0   No current facility-administered medications for this encounter.    ECOG PERFORMANCE STATUS:1  REVIEW OF SYSTEMS: Patient is oxygen dependent on nasal oxygen and wheelchair-bound.  Also significant COPD CAD and does have a penile prosthesis. Patient denies any weight loss, fatigue, weakness, fever, chills or night sweats. Patient denies any loss of vision, blurred vision. Patient denies any ringing  of the ears or hearing loss. No irregular heartbeat. Patient denies heart murmur or history of fainting. Patient denies any chest pain or pain radiating to her upper extremities. Patient denies any shortness of  breath, difficulty breathing at night, cough or hemoptysis. Patient denies any swelling in the lower legs. Patient denies any nausea vomiting, vomiting of blood, or coffee ground material in the vomitus. Patient denies any stomach pain. Patient states has had normal bowel movements no significant constipation or diarrhea. Patient denies any dysuria, hematuria or significant nocturia. Patient denies any problems walking, swelling in the joints or loss of balance. Patient denies any skin changes, loss of hair or loss of weight. Patient denies any excessive worrying or anxiety or significant depression. Patient denies any problems with insomnia. Patient denies excessive thirst, polyuria, polydipsia. Patient denies any swollen glands, patient denies  easy bruising or easy bleeding. Patient denies any recent infections, allergies or URI. Patient "s visual fields have not changed significantly in recent time.   PHYSICAL EXAM: BP 135/72 (BP Location: Left Arm, Patient Position: Sitting, Cuff Size: Normal)   Pulse 90   Temp 98 F (36.7 C) (Tympanic)   Wt 173 lb 9.6 oz (78.7 kg)   SpO2 98% Comment: 2 liters o2  BMI 24.91 kg/m  Well-developed wheelchair-bound male on nasal oxygen in NAD.  Well-developed well-nourished patient in NAD. HEENT reveals PERLA, EOMI, discs not visualized.  Oral cavity is clear. No oral mucosal lesions are identified. Neck is clear without evidence of cervical or supraclavicular adenopathy. Lungs are clear to A&P. Cardiac examination is essentially unremarkable with regular rate and rhythm without murmur rub or thrill. Abdomen is benign with no organomegaly or masses noted. Motor sensory and DTR levels are equal and symmetric in the upper and lower extremities. Cranial nerves II through XII are grossly intact. Proprioception is intact. No peripheral adenopathy or edema is identified. No motor or sensory levels are noted. Crude visual fields are within normal range.  LABORATORY DATA: Pathology report reviewed    RADIOLOGY RESULTS: MRI scan reviewed compatible with above-stated findings   IMPRESSION: Stage IIa Gleason 7 (3+4) adenocarcinoma the prostate with a PSA of 4.7 and patient with multiple medical comorbidities on nasal oxygen.  PLAN: At this time I recommend image guided IMRT radiation therapy I would plan on delivering 80 Gray over 8 weeks.  I have asked Dr. Annetta Maw to place fiducial markers in his prostate for daily image guided treatment risks and benefits of treatment including increased lower urinary tract symptoms diarrhea fatigue alteration of blood counts and possible skin changes were discussed in detail with the patient.  He seems to comprehend my treatment plan well.  We have given him markers today and  will simulate the patient after Dr. Annetta Maw has placed his fiducial markers.  Patient comprehends my recommendations well.  I would like to take this opportunity to thank you for allowing me to participate in the care of your patient.Noreene Filbert, MD

## 2020-09-02 ENCOUNTER — Telehealth: Payer: Self-pay | Admitting: Urology

## 2020-09-02 NOTE — Telephone Encounter (Signed)
appt has been made and instructions have been given to patient   Leslie Sims

## 2020-09-02 NOTE — Telephone Encounter (Signed)
-----   Message from Janan Halter, [place] sent at 09/01/2020  2:44 PM EDT ----- Regarding: Gold markers Patient was given gold markers to have placed today, please call the patient and schedule appt. Thank you!

## 2020-09-04 ENCOUNTER — Ambulatory Visit: Payer: Medicare Other | Admitting: Cardiovascular Disease

## 2020-09-04 NOTE — Telephone Encounter (Signed)
Patient left a message asking if he could have another valium for his gold seed placement like he had for his prostate biopsy. Please advise?

## 2020-09-05 ENCOUNTER — Other Ambulatory Visit: Payer: Self-pay | Admitting: Urology

## 2020-09-05 MED ORDER — DIAZEPAM 5 MG PO TABS
5.0000 mg | ORAL_TABLET | Freq: Once | ORAL | 0 refills | Status: DC | PRN
Start: 2020-09-05 — End: 2021-01-07

## 2020-09-05 NOTE — Telephone Encounter (Signed)
Called pt made him aware to pick up RX. Pt voiced understanding.

## 2020-09-05 NOTE — Telephone Encounter (Signed)
Yes, I sent to pharmacy, thanks  Nickolas Madrid, MD 09/05/2020

## 2020-09-10 ENCOUNTER — Ambulatory Visit: Payer: Medicare Other | Admitting: Urology

## 2020-09-10 ENCOUNTER — Ambulatory Visit: Payer: Self-pay | Admitting: Urology

## 2020-09-15 ENCOUNTER — Other Ambulatory Visit: Payer: Self-pay

## 2020-09-15 ENCOUNTER — Encounter: Payer: Self-pay | Admitting: Urology

## 2020-09-15 ENCOUNTER — Ambulatory Visit (INDEPENDENT_AMBULATORY_CARE_PROVIDER_SITE_OTHER): Payer: Medicare Other | Admitting: Urology

## 2020-09-15 VITALS — BP 116/72 | HR 81 | Ht 70.0 in | Wt 173.0 lb

## 2020-09-15 DIAGNOSIS — R972 Elevated prostate specific antigen [PSA]: Secondary | ICD-10-CM | POA: Diagnosis not present

## 2020-09-15 MED ORDER — GENTAMICIN SULFATE 40 MG/ML IJ SOLN
80.0000 mg | Freq: Once | INTRAMUSCULAR | Status: AC
  Administered 2020-09-15: 80 mg via INTRAMUSCULAR

## 2020-09-15 MED ORDER — LEVOFLOXACIN 500 MG PO TABS
500.0000 mg | ORAL_TABLET | Freq: Once | ORAL | Status: AC
  Administered 2020-09-15: 500 mg via ORAL

## 2020-09-15 NOTE — Progress Notes (Signed)
° °  09/15/20  Indication: Favorable intermediate risk prostate cancer  Prostate gold seed marker placement procedure   Informed consent was obtained, and we discussed the risks of bleeding and infection/sepsis. A time out was performed to ensure correct patient identity.  - Gentamicin and levaquin given for antibiotic prophylaxis  Procedure: -The transrectal ultrasound probe was inserted into the rectum.  Under ultrasound vision, gold seed markers were placed at the lateral bases bilaterally, as well as a single marker at the midline apex.  Post-Procedure: - Patient tolerated the procedure well - He was counseled to seek immediate medical attention if experiences significant bleeding, fevers, or severe pain   Assessment/ Plan: Proceed with external beam radiation with Dr. Baruch Gouty, follow-up in 6 months for symptom check and PSA  Patient opts to defer ADT which is very reasonable with his numerous comorbidities and favorable intermediate risk disease  Nickolas Madrid, MD 09/15/2020

## 2020-09-17 ENCOUNTER — Ambulatory Visit
Admission: RE | Admit: 2020-09-17 | Discharge: 2020-09-17 | Disposition: A | Discharge status: Home / Self Care | Payer: Medicare Other | Source: Ambulatory Visit | Attending: Radiation Oncology | Admitting: Radiation Oncology

## 2020-09-17 DIAGNOSIS — C61 Malignant neoplasm of prostate: Secondary | ICD-10-CM | POA: Insufficient documentation

## 2020-09-19 ENCOUNTER — Other Ambulatory Visit: Payer: Self-pay | Admitting: *Deleted

## 2020-09-19 DIAGNOSIS — C61 Malignant neoplasm of prostate: Secondary | ICD-10-CM | POA: Insufficient documentation

## 2020-09-24 ENCOUNTER — Ambulatory Visit: Admission: RE | Admit: 2020-09-24 | Payer: Medicare Other | Source: Ambulatory Visit

## 2020-09-25 ENCOUNTER — Ambulatory Visit
Admission: RE | Admit: 2020-09-25 | Discharge: 2020-09-25 | Disposition: A | Discharge status: Home / Self Care | Payer: Medicare Other | Source: Ambulatory Visit | Attending: Radiation Oncology | Admitting: Radiation Oncology

## 2020-09-25 DIAGNOSIS — C61 Malignant neoplasm of prostate: Secondary | ICD-10-CM | POA: Diagnosis not present

## 2020-09-26 ENCOUNTER — Other Ambulatory Visit: Payer: Self-pay | Admitting: *Deleted

## 2020-09-26 ENCOUNTER — Ambulatory Visit
Admission: RE | Admit: 2020-09-26 | Discharge: 2020-09-26 | Disposition: A | Discharge status: Home / Self Care | Payer: Medicare Other | Source: Ambulatory Visit | Attending: Radiation Oncology | Admitting: Radiation Oncology

## 2020-09-26 DIAGNOSIS — C61 Malignant neoplasm of prostate: Secondary | ICD-10-CM | POA: Diagnosis not present

## 2020-09-29 ENCOUNTER — Ambulatory Visit
Admission: RE | Admit: 2020-09-29 | Discharge: 2020-09-29 | Disposition: A | Discharge status: Home / Self Care | Payer: Medicare Other | Source: Ambulatory Visit | Attending: Radiation Oncology | Admitting: Radiation Oncology

## 2020-09-29 DIAGNOSIS — C61 Malignant neoplasm of prostate: Secondary | ICD-10-CM | POA: Diagnosis not present

## 2020-09-30 ENCOUNTER — Ambulatory Visit
Admission: RE | Admit: 2020-09-30 | Discharge: 2020-09-30 | Disposition: A | Discharge status: Home / Self Care | Payer: Medicare Other | Source: Ambulatory Visit | Attending: Radiation Oncology | Admitting: Radiation Oncology

## 2020-09-30 DIAGNOSIS — C61 Malignant neoplasm of prostate: Secondary | ICD-10-CM | POA: Diagnosis not present

## 2020-10-01 ENCOUNTER — Ambulatory Visit
Admission: RE | Admit: 2020-10-01 | Discharge: 2020-10-01 | Disposition: A | Discharge status: Home / Self Care | Payer: Medicare Other | Source: Ambulatory Visit | Attending: Radiation Oncology | Admitting: Radiation Oncology

## 2020-10-01 DIAGNOSIS — C61 Malignant neoplasm of prostate: Secondary | ICD-10-CM | POA: Diagnosis not present

## 2020-10-02 ENCOUNTER — Ambulatory Visit
Admission: RE | Admit: 2020-10-02 | Discharge: 2020-10-02 | Disposition: A | Discharge status: Home / Self Care | Payer: Medicare Other | Source: Ambulatory Visit | Attending: Radiation Oncology | Admitting: Radiation Oncology

## 2020-10-02 DIAGNOSIS — C61 Malignant neoplasm of prostate: Secondary | ICD-10-CM | POA: Diagnosis not present

## 2020-10-03 ENCOUNTER — Ambulatory Visit
Admission: RE | Admit: 2020-10-03 | Discharge: 2020-10-03 | Disposition: A | Discharge status: Home / Self Care | Payer: Medicare Other | Source: Ambulatory Visit | Attending: Radiation Oncology | Admitting: Radiation Oncology

## 2020-10-03 DIAGNOSIS — C61 Malignant neoplasm of prostate: Secondary | ICD-10-CM | POA: Diagnosis not present

## 2020-10-06 ENCOUNTER — Ambulatory Visit
Admission: RE | Admit: 2020-10-06 | Discharge: 2020-10-06 | Disposition: A | Discharge status: Home / Self Care | Payer: Medicare Other | Source: Ambulatory Visit | Attending: Radiation Oncology | Admitting: Radiation Oncology

## 2020-10-06 DIAGNOSIS — C61 Malignant neoplasm of prostate: Secondary | ICD-10-CM | POA: Diagnosis not present

## 2020-10-07 ENCOUNTER — Ambulatory Visit
Admission: RE | Admit: 2020-10-07 | Discharge: 2020-10-07 | Disposition: A | Discharge status: Home / Self Care | Payer: Medicare Other | Source: Ambulatory Visit | Attending: Radiation Oncology | Admitting: Radiation Oncology

## 2020-10-07 DIAGNOSIS — C61 Malignant neoplasm of prostate: Secondary | ICD-10-CM | POA: Diagnosis not present

## 2020-10-08 ENCOUNTER — Ambulatory Visit
Admission: RE | Admit: 2020-10-08 | Discharge: 2020-10-08 | Disposition: A | Discharge status: Home / Self Care | Payer: Medicare Other | Source: Ambulatory Visit | Attending: Radiation Oncology | Admitting: Radiation Oncology

## 2020-10-08 DIAGNOSIS — C61 Malignant neoplasm of prostate: Secondary | ICD-10-CM | POA: Diagnosis not present

## 2020-10-09 ENCOUNTER — Ambulatory Visit
Admission: RE | Admit: 2020-10-09 | Discharge: 2020-10-09 | Disposition: A | Discharge status: Home / Self Care | Payer: Medicare Other | Source: Ambulatory Visit | Attending: Radiation Oncology | Admitting: Radiation Oncology

## 2020-10-09 ENCOUNTER — Other Ambulatory Visit: Payer: Self-pay

## 2020-10-09 ENCOUNTER — Inpatient Hospital Stay: Payer: Medicare Other | Attending: Hematology and Oncology

## 2020-10-09 DIAGNOSIS — C61 Malignant neoplasm of prostate: Secondary | ICD-10-CM | POA: Diagnosis present

## 2020-10-09 LAB — CBC
HCT: 39.4 % (ref 39.0–52.0)
Hemoglobin: 12.7 g/dL — ABNORMAL LOW (ref 13.0–17.0)
MCH: 29.2 pg (ref 26.0–34.0)
MCHC: 32.2 g/dL (ref 30.0–36.0)
MCV: 90.6 fL (ref 80.0–100.0)
Platelets: 236 10*3/uL (ref 150–400)
RBC: 4.35 MIL/uL (ref 4.22–5.81)
RDW: 12.6 % (ref 11.5–15.5)
WBC: 8.8 10*3/uL (ref 4.0–10.5)
nRBC: 0 % (ref 0.0–0.2)

## 2020-10-10 ENCOUNTER — Ambulatory Visit
Admission: RE | Admit: 2020-10-10 | Discharge: 2020-10-10 | Disposition: A | Discharge status: Home / Self Care | Payer: Medicare Other | Source: Ambulatory Visit | Attending: Radiation Oncology | Admitting: Radiation Oncology

## 2020-10-10 DIAGNOSIS — C61 Malignant neoplasm of prostate: Secondary | ICD-10-CM | POA: Diagnosis not present

## 2020-10-11 DIAGNOSIS — C61 Malignant neoplasm of prostate: Secondary | ICD-10-CM | POA: Insufficient documentation

## 2020-10-13 ENCOUNTER — Ambulatory Visit
Admission: RE | Admit: 2020-10-13 | Discharge: 2020-10-13 | Disposition: A | Discharge status: Home / Self Care | Payer: Medicare Other | Source: Ambulatory Visit | Attending: Radiation Oncology | Admitting: Radiation Oncology

## 2020-10-13 DIAGNOSIS — C61 Malignant neoplasm of prostate: Secondary | ICD-10-CM | POA: Diagnosis not present

## 2020-10-14 ENCOUNTER — Ambulatory Visit
Admission: RE | Admit: 2020-10-14 | Discharge: 2020-10-14 | Disposition: A | Discharge status: Home / Self Care | Payer: Medicare Other | Source: Ambulatory Visit | Attending: Radiation Oncology | Admitting: Radiation Oncology

## 2020-10-14 ENCOUNTER — Other Ambulatory Visit: Payer: Self-pay | Admitting: Licensed Clinical Social Worker

## 2020-10-14 DIAGNOSIS — C61 Malignant neoplasm of prostate: Secondary | ICD-10-CM

## 2020-10-14 MED ORDER — HYDROCORTISONE 2.5 % EX CREA
TOPICAL_CREAM | Freq: Two times a day (BID) | CUTANEOUS | 0 refills | Status: DC
Start: 2020-10-14 — End: 2021-01-15

## 2020-10-15 ENCOUNTER — Ambulatory Visit
Admission: RE | Admit: 2020-10-15 | Discharge: 2020-10-15 | Disposition: A | Discharge status: Home / Self Care | Payer: Medicare Other | Source: Ambulatory Visit | Attending: Radiation Oncology | Admitting: Radiation Oncology

## 2020-10-15 DIAGNOSIS — C61 Malignant neoplasm of prostate: Secondary | ICD-10-CM | POA: Diagnosis not present

## 2020-10-16 ENCOUNTER — Ambulatory Visit
Admission: RE | Admit: 2020-10-16 | Discharge: 2020-10-16 | Disposition: A | Discharge status: Home / Self Care | Payer: Medicare Other | Source: Ambulatory Visit | Attending: Radiation Oncology | Admitting: Radiation Oncology

## 2020-10-16 DIAGNOSIS — C61 Malignant neoplasm of prostate: Secondary | ICD-10-CM | POA: Diagnosis not present

## 2020-10-17 ENCOUNTER — Ambulatory Visit
Admission: RE | Admit: 2020-10-17 | Discharge: 2020-10-17 | Disposition: A | Discharge status: Home / Self Care | Payer: Medicare Other | Source: Ambulatory Visit | Attending: Radiation Oncology | Admitting: Radiation Oncology

## 2020-10-17 DIAGNOSIS — C61 Malignant neoplasm of prostate: Secondary | ICD-10-CM | POA: Diagnosis not present

## 2020-10-20 ENCOUNTER — Ambulatory Visit
Admission: RE | Admit: 2020-10-20 | Discharge: 2020-10-20 | Disposition: A | Discharge status: Home / Self Care | Payer: Medicare Other | Source: Ambulatory Visit | Attending: Radiation Oncology | Admitting: Radiation Oncology

## 2020-10-20 DIAGNOSIS — C61 Malignant neoplasm of prostate: Secondary | ICD-10-CM | POA: Diagnosis not present

## 2020-10-21 ENCOUNTER — Ambulatory Visit
Admission: RE | Admit: 2020-10-21 | Discharge: 2020-10-21 | Disposition: A | Discharge status: Home / Self Care | Payer: Medicare Other | Source: Ambulatory Visit | Attending: Radiation Oncology | Admitting: Radiation Oncology

## 2020-10-21 DIAGNOSIS — C61 Malignant neoplasm of prostate: Secondary | ICD-10-CM | POA: Diagnosis not present

## 2020-10-22 ENCOUNTER — Ambulatory Visit
Admission: RE | Admit: 2020-10-22 | Discharge: 2020-10-22 | Disposition: A | Discharge status: Home / Self Care | Payer: Medicare Other | Source: Ambulatory Visit | Attending: Radiation Oncology | Admitting: Radiation Oncology

## 2020-10-22 DIAGNOSIS — C61 Malignant neoplasm of prostate: Secondary | ICD-10-CM | POA: Diagnosis not present

## 2020-10-23 ENCOUNTER — Inpatient Hospital Stay: Payer: Medicare Other | Attending: Hematology and Oncology

## 2020-10-23 ENCOUNTER — Ambulatory Visit
Admission: RE | Admit: 2020-10-23 | Discharge: 2020-10-23 | Disposition: A | Discharge status: Home / Self Care | Payer: Medicare Other | Source: Ambulatory Visit | Attending: Radiation Oncology | Admitting: Radiation Oncology

## 2020-10-23 ENCOUNTER — Other Ambulatory Visit: Payer: Self-pay

## 2020-10-23 DIAGNOSIS — C61 Malignant neoplasm of prostate: Secondary | ICD-10-CM | POA: Diagnosis not present

## 2020-10-23 DIAGNOSIS — R918 Other nonspecific abnormal finding of lung field: Secondary | ICD-10-CM | POA: Insufficient documentation

## 2020-10-23 LAB — CBC
HCT: 42.1 % (ref 39.0–52.0)
Hemoglobin: 13.6 g/dL (ref 13.0–17.0)
MCH: 29.4 pg (ref 26.0–34.0)
MCHC: 32.3 g/dL (ref 30.0–36.0)
MCV: 91.1 fL (ref 80.0–100.0)
Platelets: 221 10*3/uL (ref 150–400)
RBC: 4.62 MIL/uL (ref 4.22–5.81)
RDW: 12.6 % (ref 11.5–15.5)
WBC: 6.8 10*3/uL (ref 4.0–10.5)
nRBC: 0 % (ref 0.0–0.2)

## 2020-10-24 ENCOUNTER — Ambulatory Visit
Admission: RE | Admit: 2020-10-24 | Discharge: 2020-10-24 | Disposition: A | Discharge status: Home / Self Care | Payer: Medicare Other | Source: Ambulatory Visit | Attending: Radiation Oncology | Admitting: Radiation Oncology

## 2020-10-24 DIAGNOSIS — C61 Malignant neoplasm of prostate: Secondary | ICD-10-CM | POA: Diagnosis not present

## 2020-10-27 ENCOUNTER — Ambulatory Visit
Admission: RE | Admit: 2020-10-27 | Discharge: 2020-10-27 | Disposition: A | Discharge status: Home / Self Care | Payer: Medicare Other | Source: Ambulatory Visit | Attending: Radiation Oncology | Admitting: Radiation Oncology

## 2020-10-27 DIAGNOSIS — C61 Malignant neoplasm of prostate: Secondary | ICD-10-CM | POA: Diagnosis not present

## 2020-10-28 ENCOUNTER — Ambulatory Visit
Admission: RE | Admit: 2020-10-28 | Discharge: 2020-10-28 | Disposition: A | Discharge status: Home / Self Care | Payer: Medicare Other | Source: Ambulatory Visit | Attending: Radiation Oncology | Admitting: Radiation Oncology

## 2020-10-28 DIAGNOSIS — C61 Malignant neoplasm of prostate: Secondary | ICD-10-CM | POA: Diagnosis not present

## 2020-10-29 ENCOUNTER — Ambulatory Visit
Admission: RE | Admit: 2020-10-29 | Discharge: 2020-10-29 | Disposition: A | Discharge status: Home / Self Care | Payer: Medicare Other | Source: Ambulatory Visit | Attending: Radiation Oncology | Admitting: Radiation Oncology

## 2020-10-29 DIAGNOSIS — C61 Malignant neoplasm of prostate: Secondary | ICD-10-CM | POA: Diagnosis not present

## 2020-10-30 ENCOUNTER — Ambulatory Visit
Admission: RE | Admit: 2020-10-30 | Discharge: 2020-10-30 | Disposition: A | Discharge status: Home / Self Care | Payer: Medicare Other | Source: Ambulatory Visit | Attending: Radiation Oncology | Admitting: Radiation Oncology

## 2020-10-30 DIAGNOSIS — C61 Malignant neoplasm of prostate: Secondary | ICD-10-CM | POA: Diagnosis not present

## 2020-10-31 ENCOUNTER — Ambulatory Visit
Admission: RE | Admit: 2020-10-31 | Discharge: 2020-10-31 | Disposition: A | Discharge status: Home / Self Care | Payer: Medicare Other | Source: Ambulatory Visit | Attending: Radiation Oncology | Admitting: Radiation Oncology

## 2020-10-31 DIAGNOSIS — C61 Malignant neoplasm of prostate: Secondary | ICD-10-CM | POA: Diagnosis not present

## 2020-11-03 ENCOUNTER — Ambulatory Visit
Admission: RE | Admit: 2020-11-03 | Discharge: 2020-11-03 | Disposition: A | Discharge status: Home / Self Care | Payer: Medicare Other | Source: Ambulatory Visit | Attending: Radiation Oncology | Admitting: Radiation Oncology

## 2020-11-03 DIAGNOSIS — C61 Malignant neoplasm of prostate: Secondary | ICD-10-CM | POA: Diagnosis not present

## 2020-11-04 ENCOUNTER — Ambulatory Visit
Admission: RE | Admit: 2020-11-04 | Discharge: 2020-11-04 | Disposition: A | Discharge status: Home / Self Care | Payer: Medicare Other | Source: Ambulatory Visit | Attending: Radiation Oncology | Admitting: Radiation Oncology

## 2020-11-04 DIAGNOSIS — C61 Malignant neoplasm of prostate: Secondary | ICD-10-CM | POA: Diagnosis not present

## 2020-11-05 ENCOUNTER — Ambulatory Visit
Admission: RE | Admit: 2020-11-05 | Discharge: 2020-11-05 | Disposition: A | Discharge status: Home / Self Care | Payer: Medicare Other | Source: Ambulatory Visit | Attending: Radiation Oncology | Admitting: Radiation Oncology

## 2020-11-05 DIAGNOSIS — C61 Malignant neoplasm of prostate: Secondary | ICD-10-CM | POA: Diagnosis not present

## 2020-11-06 ENCOUNTER — Inpatient Hospital Stay: Payer: Medicare Other

## 2020-11-06 ENCOUNTER — Other Ambulatory Visit: Payer: Self-pay

## 2020-11-06 ENCOUNTER — Ambulatory Visit
Admission: RE | Admit: 2020-11-06 | Discharge: 2020-11-06 | Disposition: A | Discharge status: Home / Self Care | Payer: Medicare Other | Source: Ambulatory Visit | Attending: Radiation Oncology | Admitting: Radiation Oncology

## 2020-11-06 DIAGNOSIS — R918 Other nonspecific abnormal finding of lung field: Secondary | ICD-10-CM | POA: Diagnosis not present

## 2020-11-06 DIAGNOSIS — C61 Malignant neoplasm of prostate: Secondary | ICD-10-CM

## 2020-11-06 LAB — CBC
HCT: 39.7 % (ref 39.0–52.0)
Hemoglobin: 12.8 g/dL — ABNORMAL LOW (ref 13.0–17.0)
MCH: 29.4 pg (ref 26.0–34.0)
MCHC: 32.2 g/dL (ref 30.0–36.0)
MCV: 91.3 fL (ref 80.0–100.0)
Platelets: 212 10*3/uL (ref 150–400)
RBC: 4.35 MIL/uL (ref 4.22–5.81)
RDW: 12.5 % (ref 11.5–15.5)
WBC: 5.5 10*3/uL (ref 4.0–10.5)
nRBC: 0 % (ref 0.0–0.2)

## 2020-11-07 ENCOUNTER — Ambulatory Visit: Admission: RE | Admit: 2020-11-07 | Payer: Medicare Other | Source: Ambulatory Visit

## 2020-11-07 DIAGNOSIS — C61 Malignant neoplasm of prostate: Secondary | ICD-10-CM | POA: Diagnosis not present

## 2020-11-10 ENCOUNTER — Ambulatory Visit
Admission: RE | Admit: 2020-11-10 | Discharge: 2020-11-10 | Disposition: A | Discharge status: Home / Self Care | Payer: Medicare Other | Source: Ambulatory Visit | Attending: Radiation Oncology | Admitting: Radiation Oncology

## 2020-11-10 DIAGNOSIS — C61 Malignant neoplasm of prostate: Secondary | ICD-10-CM | POA: Diagnosis not present

## 2020-11-11 ENCOUNTER — Ambulatory Visit
Admission: RE | Admit: 2020-11-11 | Discharge: 2020-11-11 | Disposition: A | Discharge status: Home / Self Care | Payer: Medicare Other | Source: Ambulatory Visit | Attending: Radiation Oncology | Admitting: Radiation Oncology

## 2020-11-11 DIAGNOSIS — C61 Malignant neoplasm of prostate: Secondary | ICD-10-CM | POA: Diagnosis not present

## 2020-11-12 ENCOUNTER — Ambulatory Visit
Admission: RE | Admit: 2020-11-12 | Discharge: 2020-11-12 | Disposition: A | Discharge status: Home / Self Care | Payer: Medicare Other | Source: Ambulatory Visit | Attending: Radiation Oncology | Admitting: Radiation Oncology

## 2020-11-12 DIAGNOSIS — C61 Malignant neoplasm of prostate: Secondary | ICD-10-CM | POA: Diagnosis not present

## 2020-11-17 ENCOUNTER — Ambulatory Visit
Admission: RE | Admit: 2020-11-17 | Discharge: 2020-11-17 | Disposition: A | Discharge status: Home / Self Care | Payer: Medicare Other | Source: Ambulatory Visit | Attending: Radiation Oncology | Admitting: Radiation Oncology

## 2020-11-17 DIAGNOSIS — C61 Malignant neoplasm of prostate: Secondary | ICD-10-CM | POA: Diagnosis not present

## 2020-11-18 ENCOUNTER — Ambulatory Visit
Admission: RE | Admit: 2020-11-18 | Discharge: 2020-11-18 | Disposition: A | Discharge status: Home / Self Care | Payer: Medicare Other | Source: Ambulatory Visit | Attending: Radiation Oncology | Admitting: Radiation Oncology

## 2020-11-18 DIAGNOSIS — C61 Malignant neoplasm of prostate: Secondary | ICD-10-CM | POA: Diagnosis not present

## 2020-11-19 ENCOUNTER — Ambulatory Visit
Admission: RE | Admit: 2020-11-19 | Discharge: 2020-11-19 | Disposition: A | Discharge status: Home / Self Care | Payer: Medicare Other | Source: Ambulatory Visit | Attending: Radiation Oncology | Admitting: Radiation Oncology

## 2020-11-19 DIAGNOSIS — C61 Malignant neoplasm of prostate: Secondary | ICD-10-CM | POA: Diagnosis present

## 2020-11-20 ENCOUNTER — Ambulatory Visit
Admission: RE | Admit: 2020-11-20 | Discharge: 2020-11-20 | Disposition: A | Discharge status: Home / Self Care | Payer: Medicare Other | Source: Ambulatory Visit | Attending: Radiation Oncology | Admitting: Radiation Oncology

## 2020-11-20 DIAGNOSIS — C61 Malignant neoplasm of prostate: Secondary | ICD-10-CM | POA: Diagnosis not present

## 2020-11-21 ENCOUNTER — Ambulatory Visit
Admission: RE | Admit: 2020-11-21 | Discharge: 2020-11-21 | Disposition: A | Discharge status: Home / Self Care | Payer: Medicare Other | Source: Ambulatory Visit | Attending: Radiation Oncology | Admitting: Radiation Oncology

## 2020-11-21 DIAGNOSIS — C61 Malignant neoplasm of prostate: Secondary | ICD-10-CM | POA: Diagnosis not present

## 2020-12-30 ENCOUNTER — Ambulatory Visit: Payer: Medicare Other | Admitting: Cardiovascular Disease

## 2020-12-31 ENCOUNTER — Ambulatory Visit: Payer: Medicare Other | Admitting: Radiation Oncology

## 2021-01-02 ENCOUNTER — Telehealth: Payer: Self-pay | Admitting: Cardiovascular Disease

## 2021-01-02 NOTE — Telephone Encounter (Signed)
Call transferred directly to triage. The patient called in today reporting chest burning that radiates from his left to right shoulder. Her states he had his COVID booster on 12/15/21/1 and symptoms started ~ 1 week after that.   He was seen by Urgent Care on 12/31/20. He states he was told his EKG was "normal," but with his describes symptoms, he was advised to report to the ER for cardiac enzymes. The patient refused the ER due to wait times & COVID.  He contacted his PCP and was seen there yesterday.  He advised his work up there was negative including a chest x-ray, but he was advised to call our office for follow up.  The patient has a history of an MI in 2010. I inquired if symptoms that he is currently having are similar to let.  The patient states he was not even aware he had had a heart attack at the time.  He does have a history of stents.   The patient's symptoms are presently no worse. He denies any other associated symptoms.   I have advised him that he should be seen for evaluation. He is aware our clinics are virtual on Monday due to the potential weather issues.   I have advised I feel like he needs an in person assessment and repeat EKG. He is only wanting to see Dr. Fletcher Anon.  I advised the patient Dr. Fletcher Anon may see him on Tuesday 01/06/21 at 4:40 pm, however, if his symptoms change or worsen in any way, the recommendation is to call 911 or report to the nearest ER.  The patient voices understanding and is agreeable.

## 2021-01-02 NOTE — Telephone Encounter (Signed)
Pt c/o of Chest Pain: STAT if CP now or developed within 24 hours  1. Are you having CP right now? yes  2. Are you experiencing any other symptoms (ex. SOB, nausea, vomiting, sweating)? Increased sob burning sensation radiates across chest radiating to right shoulder   3. How long have you been experiencing CP?  About a week   4. Is your CP continuous or coming and going? interim  5. Have you taken Nitroglycerin? No  ?

## 2021-01-07 ENCOUNTER — Encounter: Payer: Self-pay | Admitting: Cardiovascular Disease

## 2021-01-07 ENCOUNTER — Other Ambulatory Visit: Payer: Self-pay

## 2021-01-07 ENCOUNTER — Ambulatory Visit (INDEPENDENT_AMBULATORY_CARE_PROVIDER_SITE_OTHER): Payer: Medicare Other | Admitting: Cardiovascular Disease

## 2021-01-07 VITALS — BP 144/70 | HR 83 | Ht 70.0 in | Wt 187.0 lb

## 2021-01-07 DIAGNOSIS — J449 Chronic obstructive pulmonary disease, unspecified: Secondary | ICD-10-CM

## 2021-01-07 DIAGNOSIS — I5022 Chronic systolic (congestive) heart failure: Secondary | ICD-10-CM

## 2021-01-07 DIAGNOSIS — I2510 Atherosclerotic heart disease of native coronary artery without angina pectoris: Secondary | ICD-10-CM | POA: Diagnosis not present

## 2021-01-07 DIAGNOSIS — E785 Hyperlipidemia, unspecified: Secondary | ICD-10-CM | POA: Diagnosis not present

## 2021-01-07 DIAGNOSIS — I493 Ventricular premature depolarization: Secondary | ICD-10-CM

## 2021-01-07 MED ORDER — METOPROLOL SUCCINATE ER 25 MG PO TB24
25.0000 mg | ORAL_TABLET | Freq: Every day | ORAL | 5 refills | Status: DC
Start: 2021-01-07 — End: 2021-02-06

## 2021-01-07 NOTE — Progress Notes (Signed)
Cardiology Office Note   Date:  01/07/2021   ID:  Leslie Sims, DOB January 25, 1951, MRN [id number]  PCP:  [name], MD  Cardiologist:   Kathlyn Sacramento, MD   Chief Complaint  Patient presents with  . Other    Patient c.o chest discomfort. Meds reviewed verbally with patient.       History of Present Illness: Leslie Sims is a 70 y.o. male who presents for a follow-up visit regarding coronary artery disease and ischemic cardiomyopathy.  He has known history of severe COPD on home oxygen, prolonged history of previous tobacco use, anxiety/depression and hyperlipidemia. He is status post anterior myocardial infarction in 2010 with finding of an occluded LAD and a 99% stenosis in the RCA.  He was treated with drug-eluting stent placement to the LAD and RCA at that time.  He subsequently underwent repeat catheterization in November 2017 which showed patent LAD and RCA stents with an EF of 35 to 40%.  He had multiple hospitalizations in 2020  for COPD exacerbations but was finally able to quit smoking and had no hospitalizations since then.  He continues to use 2 L nasal cannula oxygen.   Since his last visit, he was diagnosed with prostate cancer.  He was not felt to be a very good surgical candidate and thus he underwent radiation treatment.  He had recent burning sensation in the chest similar to prior GERD.  He was seen by Dr. Tamala Julian and was noted to be in ventricular bigeminy.  He denies palpitations or dizziness.  He reports improvement in chest discomfort since he was started on Carafate.   Past Medical History:  Diagnosis Date  . Allergic rhinitis   . Anxiety   . Back pain   . Cancer [facility])    prostate  . Colon polyps 08/2012   colonoscopy; multiple colon polyps; repeat colonoscopy in one year.  [name] COPD (chronic obstructive pulmonary disease) ([place])    a. on home O2.  . Coronary artery disease 11/2009   a. late presenting ant MI; b. LHC 100% pLAD s/p PCI/DES,  99% mRCA s/p PCI/DES, EF 35%; c. nuclear stress test 05/13: prior ant/inf infarcts w/o ischemia, EF 43%; d. LHC 02/14: widely patent stents with no other obs dz, EF 40%; e. LHC 11/17: LM nl, mLAD 10%, patent LAD stent, dLAD 20%, p-mRCA 10%, mRCA 40%, patent RCA stent, EF 35-45%   . Depression   . GERD (gastroesophageal reflux disease)   . Headache(784.0)   . Helicobacter pylori (H. pylori)   . Hemorrhoids   . Hernia    inguinal  . HFrEF (heart failure with reduced ejection fraction) ([place])    a. 10/2016 LV gram: EF 35-45%; b. 06/2018 Echo: EF 40-45%.  . Hyperlipidemia   . Hypertension   . Insomnia   . Ischemic cardiomyopathy    a. 10/2016 LV gram: EF 35-45%; b. 06/2018 Echo: EF 40-45%.  . Myalgia   . Peptic ulcer   . Pulmonary nodules   . ST elevation (STEMI) myocardial infarction involving left anterior descending coronary artery ([place]) 11/2009   a. s/p PCI to the LAD  . Status post dilation of esophageal narrowing 2000  . Tinea pedis   . Wears dentures    partial upper    Past Surgical History:  Procedure Laterality Date  . Admission  12/20/2012   COPD exacerbation.  [place].  [name] CARDIAC CATHETERIZATION    . CARDIAC CATHETERIZATION  02/05/13   [facility]  .  CARDIAC CATHETERIZATION  10/14   [facility] : patent stents with no change in anatomy. EF: 40$  . CARDIAC CATHETERIZATION Left 11/12/2016   Procedure: Left Heart Cath and Coronary Angiography;  Surgeon: Wellington Hampshire, MD;  Location: [facility] CV [facility];  Service: Cardiovascular;  Laterality: Left;  . COLONOSCOPY    . COLONOSCOPY WITH PROPOFOL N/A 05/18/2016   Procedure: COLONOSCOPY WITH PROPOFOL;  Surgeon: Lucilla Lame, MD;  Location: [facility] ENDOSCOPY;  Service: Endoscopy;  Laterality: N/A;  . COLONOSCOPY WITH PROPOFOL N/A 03/19/2020   Procedure: COLONOSCOPY WITH PROPOFOL;  Surgeon: Lin Landsman, MD;  Location[facility] ENDOSCOPY;  Service: Gastroenterology;  Laterality: N/A;  . CORONARY ANGIOPLASTY WITH STENT PLACEMENT  09/2009   LAD 3.0  X23 mm Xience DES, RCA: 4.0 X 15 mm Xience DES  . ELECTROMAGNETIC NAVIGATION BROCHOSCOPY N/A 10/27/2017   Procedure: ELECTROMAGNETIC NAVIGATION BRONCHOSCOPY;  Surgeon: Flora Lipps, MD;  Location: [facility] ORS;  Service: Cardiopulmonary;  Laterality: N/A;  . ESOPHAGEAL DILATION    . ESOPHAGOGASTRODUODENOSCOPY  2008  . ESOPHAGOGASTRODUODENOSCOPY  08/20/2012  . HERNIA REPAIR  07/20/2012   L inguinal hernia repair  . PENILE PROSTHESIS IMPLANT       Current Outpatient Medications  Medication Sig Dispense Refill  . albuterol (PROVENTIL HFA;VENTOLIN HFA) 108 (90 Base) MCG/ACT inhaler Inhale 2 puffs into the lungs every 6 (six) hours as needed for wheezing or shortness of breath. 1 Inhaler 2  . ASHWAGANDHA PO Take 1 tablet by mouth daily.    [name] aspirin 81 MG tablet Take 81 mg by mouth daily.    [name] atorvastatin (LIPITOR) 40 MG tablet Take 1 tablet (40 mg total) by mouth daily. 90 tablet 3  . Fluticasone-Salmeterol (ADVAIR) 500-50 MCG/DOSE AEPB Inhale 1 puff into the lungs every 12 (twelve) hours.    . furosemide (LASIX) 20 MG tablet Take 20 mg by mouth daily.     . hydrocortisone 2.5 % cream Apply topically 2 (two) times daily. 30 g 0  . hydrOXYzine (VISTARIL) 25 MG capsule Take 25 mg by mouth 2 (two) times daily as needed for anxiety or itching.   0  . ipratropium-albuterol (DUONEB) 0.5-2.5 (3) MG/3ML SOLN USE 1 VIAL VIA NEBULIZER AND INHALE BY MOUTH 4 TIMES A DAY 1620 mL 1  . LORazepam (ATIVAN) 1 MG tablet Take 1 tablet (1 mg total) by mouth every 4 (four) hours while awake. 30 tablet 0  . losartan (COZAAR) 25 MG tablet Take 25 mg by mouth daily.    . mirtazapine (REMERON) 15 MG tablet Take 15 mg by mouth at bedtime.    . Multiple Vitamin (MULTIVITAMIN) capsule Take 1 capsule by mouth daily.    . nitroGLYCERIN (NITROSTAT) 0.4 MG SL tablet Place 1 tablet (0.4 mg total) under the tongue every 5 (five) minutes as needed. 25 tablet 3  . OLANZapine (ZYPREXA) 2.5 MG tablet Take 2.5 mg by mouth at bedtime.    .  OXYGEN Inhale 3 L into the lungs daily.     . OXYGEN Inhale 2.5 L into the lungs daily.    . risperiDONE (RISPERDAL) 1 MG tablet Take 1 mg by mouth daily.    . sucralfate (CARAFATE) 1 g tablet Take 1 g by mouth 4 (four) times daily.    [name] tiotropium (SPIRIVA HANDIHALER) 18 MCG inhalation capsule Place 18 mcg into inhaler and inhale daily.     No current facility-administered medications for this visit.    Allergies:   Prozac [fluoxetine hcl], Effexor xr [venlafaxine hcl er],  and Wellbutrin [bupropion]    Social History:  The patient  reports that he quit smoking about 22 months ago. His smoking use included cigarettes. He has a 42.00 pack-year smoking history. He has never used smokeless tobacco. He reports that he does not drink alcohol and does not use drugs.   Family History:  The patient's family history includes Alcohol abuse in his sister and sister; COPD in his sister; COPD (age of onset: 17) in his mother; Coronary artery disease (age of onset: 64) in his father; Diabetes in his brother, brother, and father; Heart attack in his brother and father; Heart disease in his father; Hypertension in his brother; Lung cancer in his sister; Penile cancer in his brother.    ROS:  Please see the history of present illness.   Otherwise, review of systems are positive for none.   All other systems are reviewed and negative.    PHYSICAL EXAM: VS:  BP (!) 144/70 (BP Location: Left Arm, Patient Position: Sitting, Cuff Size: Normal)   Pulse 83   Ht 5\' 10"  (1.778 m)   Wt 187 lb (84.8 kg)   SpO2 97%   BMI 26.83 kg/m  , BMI Body mass index is 26.83 kg/m. GEN: Well nourished, well developed, in no acute distress  HEENT: normal  Neck: no JVD, carotid bruits, or masses Cardiac: RRR; no murmurs, rubs, or gallops,no edema  Respiratory:  clear to auscultation bilaterally  with diminished breath sounds, normal work of breathing GI: soft, nontender, nondistended, + BS MS: no deformity or atrophy  Skin:  warm and dry, no rash Neuro:  Strength and sensation are intact Psych: euthymic mood, full affect  EKG:  EKG is  ordered today. I reviewed his EKG which showed sinus rhythm with a PVC and nonspecific IVCD.   Recent Labs: 07/28/2020: ALT 19; BUN 10; Creatinine, Ser 0.91; Potassium 3.9; Sodium 138 11/06/2020: Hemoglobin 12.8; Platelets 212    Lipid Panel    Component Value Date/Time   CHOL 143 07/27/2017 1525   TRIG 72 07/27/2017 1525   TRIG 65 01/15/2010 0000   HDL 62 07/27/2017 1525   CHOLHDL 2.3 07/27/2017 1525   CHOLHDL 1.8 08/24/2016 1344   VLDL 25 08/24/2016 1344   LDLCALC 67 07/27/2017 1525      Wt Readings from Last 3 Encounters:  01/07/21 187 lb (84.8 kg)  09/15/20 173 lb (78.5 kg)  09/01/20 173 lb 9.6 oz (78.7 kg)        ASSESSMENT AND PLAN:  1.  Coronary artery disease involving native coronary arteries without angina: Status post prior anterior infarct with drug-eluting stent placement to the LAD and RCA in 2010.  Catheterization 2017 showed stable anatomy.  I agree that his current chest discomfort is more suggestive of GERD than coronary artery disease.  Symptoms already improved with Carafate.  2.    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  Continue small dose losartan.  3.  Hyperlipidemia: Continue treatment with atorvastatin.  Recommend a target LDL of less than 70.  4.  Severe COPD: Followed by pulmonary: He is on home oxygen.  5.  Frequent PVCs: The patient was recently noted to have ventricular bigeminy.  Today's EKG shows only 1 PVC.  Overall, he does not have palpitations related to this but given his cardiomyopathy, I do think we have to treat this.  In the past, he did not tolerate beta-blockers due  to his lung disease but he was a heavy smoker.  I am hoping he will tolerate metoprolol and we will go ahead and start him on Toprol-XL 25 mg once daily.   If PVCs continue, repeat echocardiogram might be needed to ensure no worsening ejection fraction.   Disposition:   FU with me in 3 months  Signed,  Kathlyn Sacramento, MD  01/07/2021 11:26 AM    [place]

## 2021-01-07 NOTE — Patient Instructions (Signed)
Medication Instructions:  Your physician has recommended you make the following change in your medication:   START Metoprolol Succinate 25 mg daily. An Rx has been sent to your pharmacy.   *If you need a refill on your cardiac medications before your next appointment, please call your pharmacy*   Lab Work: None ordered If you have labs (blood work) drawn today and your tests are completely normal, you will receive your results only by: [name] MyChart Message (if you have MyChart) OR . A paper copy in the mail If you have any lab test that is abnormal or we need to change your treatment, we will call you to review the results.   Testing/Procedures: None ordered   Follow-Up: At [facility], you and your health needs are our priority.  As part of our continuing mission to provide you with exceptional heart care, we have created designated Provider Care Teams.  These Care Teams include your primary Cardiologist (physician) and Advanced Practice Providers (APPs -  Physician Assistants and Nurse Practitioners) who all work together to provide you with the care you need, when you need it.  We recommend signing up for the patient portal called "MyChart".  Sign up information is provided on this After Visit Summary.  MyChart is used to connect with patients for Virtual Visits (Telemedicine).  Patients are able to view lab/test results, encounter notes, upcoming appointments, etc.  Non-urgent messages can be sent to your provider as well.   To learn more about what you can do with MyChart, go to [email or web address].    Your next appointment:   3 month(s)  The format for your next appointment:   In Person  Provider:   You may see Kathlyn Sacramento, MD or one of the following Advanced Practice Providers on your designated Care Team:    Murray Hodgkins, NP  Christell Faith, PA-C  Marrianne Mood, PA-C  Cadence Gibs[place], [place]  Laurann Montana, NP    Other Instructions N/A

## 2021-01-14 ENCOUNTER — Other Ambulatory Visit: Payer: Self-pay

## 2021-01-14 ENCOUNTER — Ambulatory Visit (INDEPENDENT_AMBULATORY_CARE_PROVIDER_SITE_OTHER): Payer: Medicare Other | Admitting: Pulmonary Disease

## 2021-01-14 ENCOUNTER — Encounter: Payer: Self-pay | Admitting: Pulmonary Disease

## 2021-01-14 VITALS — BP 120/70 | HR 97 | Temp 97.7°F | Ht 70.0 in | Wt 187.0 lb

## 2021-01-14 DIAGNOSIS — R911 Solitary pulmonary nodule: Secondary | ICD-10-CM

## 2021-01-14 DIAGNOSIS — K219 Gastro-esophageal reflux disease without esophagitis: Secondary | ICD-10-CM

## 2021-01-14 DIAGNOSIS — R918 Other nonspecific abnormal finding of lung field: Secondary | ICD-10-CM

## 2021-01-14 DIAGNOSIS — J449 Chronic obstructive pulmonary disease, unspecified: Secondary | ICD-10-CM | POA: Diagnosis not present

## 2021-01-14 MED ORDER — BREZTRI AEROSPHERE 160-9-4.8 MCG/ACT IN AERO: 2.0000 | INHALATION_SPRAY | Freq: Two times a day (BID) | RESPIRATORY_TRACT | 0 refills | Status: DC

## 2021-01-14 MED ORDER — OMEPRAZOLE 40 MG PO CPDR: 40.0000 mg | DELAYED_RELEASE_CAPSULE | Freq: Every day | ORAL | 6 refills | Status: DC

## 2021-01-14 NOTE — Patient Instructions (Addendum)
We will order another CT scan of the chest.  We are switching your heartburn medication.  Stop taking the Carafate.  Take the new medication omeprazole 40 mg 1 capsule or tablet daily.   I am giving you a trial of an inhaler called Breztri take 2 puffs twice a day, use the spacer to get the medicine into your lungs.  Rinse your mouth well after you use it.  Let us know how you do with that and we can send in the prescription for you.  We will order breathing tests.  We will see you in follow-up in 4 to 6 weeks time call sooner should any new problems arise.

## 2021-01-14 NOTE — Progress Notes (Signed)
Subjective:    Patient ID: Leslie Sims, male    DOB: [birth year], 70 y.o.   MRN: [id number] Requesting MD/Service:Kristi Tamala Julian, MD Date of initial consultation:08/22/17by Dr. [name] Reason for consultation:COPD, smoker  PT PROFILE: 70Mformer smoker quit 02/22/2020with severe emphysema seen by multiple pulmonologists in past ([place], [place], [place], [place]) referred for further eval and mgmt of COPD. Previously seen by Dr. Lorra Hals have assumed care after Dr. Lora Havens departure.  DATA: 07/24/12 Office Spirometry: Severe obstruction (FEV1 31% pred) 08/31/13 Office Spirometry: very severe obstruction (FEV1 25% pred) 06/18/15 CT chest:Severe emphysema 03/03/17 Overnight oximetry: Lowest SpO2 72%. 95% of time SpO2 was below 90% 03/03/17 6MWT: 384 meters. Desat to 84% 10/04/17 CT chest: Two new irregularly marginated nodules, one in the RUL, and a second in the mid LUL worrisome for either synchronous lung ca or metastatic involvement of the lungs. The two small nodules described on the prior dictated report have resolved in the left superior segment and most likely were postinflammatory. Diffuse centrilobular emphysema. 10/13/17 PET scan: The new left upper lobe nodule is FDG avid consistent with malignancy. Recommend tissue confirmation. The new nodule in the right upper lobe demonstrates low level uptake. While the level of uptake suggests the possibility of an inflammatory or infectious process, a low-grade malignancy is not excluded. Given the suspected malignancy on the left, tissue confirmation should be considered on the right despite the low level of uptake. No distant metastatic disease identified 10/27/17 ENB ([place]):Transbronchial Fine Needle Aspirations 21G X7, Transbronchial Forceps Biopsy X6. ALVEOLATED LUNG TISSUE WITH HEMOSIDERIN LADEN MACROPHAGES. NEGATIVE FOR ATYPIA AND MALIGNANCY 11/01/17 PFTs:FVC: 2.96 >3.14 L (68 >72 %pred), FEV1: 0.94 >0.93 L (27  %pred), FEV1/FVC: 32%, TLC: 9.04 L (137 %pred), DLCO 62 %pred  12/07/17 CTA chest: No pulmonary embolus identified. Anterior left upper lobe ground-glass and nodular consolidation likely representing pneumonitis. Stable bilateral upper lobe pulmonary nodules. Stable emphysema. 01/21/18 CT chest: new masslike architectural distortion and ground-glass attenuation surrounding the index nodule within the RUL. The appearance favors an inflammatory or infectious process. The index nodule in the left upper lobe is slightly decreased in volume when compared with the previous exam favoring a benign process. Attention on follow-up imaging advise. Advanced changes of emphysema (very severe) 06/01/18 CTA chest:No evidence of pulmonary embolism. Waxing and waning bilateral pulmonary nodular process as described. New 8 mm nodular ill-defined focus of opacification over the lingula which may represent an acute inflammatory or infectious process. No effusion. Emphysema  10/19/18 CTA chest:No acute cardiopulmonary disease and no evidence of pulm embolism. Hospitalization 1/12-1/14/2020: COPD exacerbation Hospitalization 2/22-2/27/20: COPD exacerbation, HCAP 02/11/19 CT chest:Progressed masslike consolidation within the RUL in the region of previous waxing and waning nodule, this is contiguous with the pleural surface and demonstrates spiculated margin and probable small focus of central cavitation. Findings could be secondary to infection superimposed on chronic nodule. However cavitary neoplasm is also a concern. Multiple additional lung nodules which have not significantly changed. Small foci of ground-glass density in the RUL may reflect small focus of infection. Mod-severe emphysema 05/01/19 CT chest:Waxing/waning right lung opacities, suggesting infection, including atypical/mycobacterial infection. Improvement of the prior dominant masslike opacity argues against neoplasm. Stable left upper lobe nodule, likely  benign. Aortic Atherosclerosis and Emphysema 11/02/2019 CT chest:Multiple larger pulmonary nodules and masslike areas seen on the prior study have largely regressed. Findings indicative of infectious/inflammatory process. Diffuse bronchial wall thickening with moderate centrilobular and paraseptal emphysema. Suggestion of left main and three-vessel coronary artery disease. 04/23/2020 CT chest: "  Multiple bilateral stable pulmonary nodules. New 7 mm nodule over the posterior left upper lobe. Stable nodularity over an area ofright upper lobe/apical scarring with new 9 mm nodular focus along the most inferior aspect of this scarring. This area has shown to wax and wane on previous exams and is most likely inflammatory". 07/24/2020 CT chest: "Background of marked pulmonary emphysema with stable post infectious or inflammatory changes and with near complete resolution of a presumably infectious or inflammatory nodule in the LEFT upper lobe as compared to previous exam".  INTERVAL: Last seen byme on  07/29/2020. Last visit to ED 07/28/2020, due to chest pain. No recent COPD exacerbations.  Remains abstinent of tobacco.  Has been undergoing treatment for prostate cancer adenocarcinoma.  HPI Patient is a 70 year old former smoker (quit 2020) who follows here for the issue of COPD and chronic respiratory failure with hypoxia.  Patient is well compensated on 3 L/min nasal cannula O2.  Since his last visit in May he has not had any exacerbations of COPD.  He remains abstinent of tobacco.  He is currently on Advair 500/50 and Spiriva HandiHaler.    He complains of his left chest "burning" he has been followed by cardiology is not deemed to be cardiac.  He does have issues with reflux for which he takes Carafate, has been noted to have prior esophageal strictures.  By his history it appears that his reflux symptoms are not well controlled.  Dyspnea is about baseline.  He does feel that he can do much more than he  did at his last visit.  He follows here also for evanescent pulmonary nodules.  Most recent chest CT on 24 July 2020 that did not show any new nodules and if anything that CT showed improvement.  He does have severe emphysema.  He has not been able to complete PFTs he is willing to consider these now.  He has been doing well with a portable concentrator and has been compliant with oxygen use.  He notes marked improvement in his respiratory status since he has been more consistent in using his oxygen.  No fevers, chills or sweats.  No weight loss or anorexia.  He voices no other complaint.   Review of Systems A 10 point review of systems was performed and it is as noted above otherwise negative.  Patient Active Problem List   Diagnosis Date Noted  . Elevated PSA 07/28/2020  . Iron deficiency anemia 05/20/2020  . Goals of care, counseling/discussion   . Palliative care by specialist   . Healthcare-associated pneumonia 01/28/2019  . COPD with acute exacerbation ([place]) 12/31/2018  . Generalized anxiety disorder 11/12/2018  . Moderate episode of recurrent major depressive disorder ([place]) 11/12/2018  . Acute respiratory failure with hypoxia ([place]) 09/29/2018  . Acute respiratory failure with hypoxia and hypercapnia ([place]) 09/13/2018  . Acute on chronic respiratory failure with hypoxia and hypercapnia ([place]) 08/24/2018  . Chest pain 07/02/2018  . Grief reaction 01/13/2018  . COPD with hypoxia ([place]) 12/25/2017  . Unstable angina ([place])   . Benign neoplasm of ascending colon   . Benign neoplasm of descending colon   . Benign neoplasm of sigmoid colon   . History of colonic polyps   . Pulmonary nodules/lesions, multiple 01/05/2015  . Bruit of left carotid artery 11/04/2014  . Sleep disorder 07/18/2013  . Seasonal and perennial allergic rhinitis 05/29/2013  . Bradycardia 04/20/2011  . SMOKER 07/30/2010  . CAD, NATIVE VESSEL 04/23/2010  . Hyperlipidemia 03/24/2010  . DEPRESSION/ANXIETY  03/24/2010  . Chronic systolic heart failure ([place]) 03/24/2010  . COPD, very severe ([place]) 03/24/2010  . GERD 03/24/2010  . Essential hypertension 11/21/2009   Social History   Tobacco Use  . Smoking status: Former Smoker    Packs/day: 3.00    Years: 42.00    Pack years: 126.00    Types: Cigarettes    Quit date: 02/10/2019    Years since quitting: 1.9  . Smokeless tobacco: Never Used  Substance Use Topics  . Alcohol use: No    Alcohol/week: 0.0 standard drinks   Allergies  Allergen Reactions  . Prozac [Fluoxetine Hcl] Shortness Of Breath  . Effexor Xr [Venlafaxine Hcl Er] Other (See Comments)    "Makes me feel funny"  . Wellbutrin [Bupropion] Other (See Comments)    "Makes me feel funny"   Current Meds  Medication Sig  . albuterol (PROVENTIL HFA;VENTOLIN HFA) 108 (90 Base) MCG/ACT inhaler Inhale 2 puffs into the lungs every 6 (six) hours as needed for wheezing or shortness of breath.  . ASHWAGANDHA PO Take 1 tablet by mouth daily.  [name] aspirin 81 MG tablet Take 81 mg by mouth daily.  [name] atorvastatin (LIPITOR) 40 MG tablet Take 1 tablet (40 mg total) by mouth daily.  . Fluticasone-Salmeterol (ADVAIR) 500-50 MCG/DOSE AEPB Inhale 1 puff into the lungs every 12 (twelve) hours.  . furosemide (LASIX) 20 MG tablet Take 20 mg by mouth daily.   . hydrocortisone 2.5 % cream Apply topically 2 (two) times daily.  . hydrOXYzine (VISTARIL) 25 MG capsule Take 25 mg by mouth 2 (two) times daily as needed for anxiety or itching.   [name] ipratropium-albuterol (DUONEB) 0.5-2.5 (3) MG/3ML SOLN USE 1 VIAL VIA NEBULIZER AND INHALE BY MOUTH 4 TIMES A DAY  . LORazepam (ATIVAN) 1 MG tablet Take 1 tablet (1 mg total) by mouth every 4 (four) hours while awake.  . losartan (COZAAR) 25 MG tablet Take 25 mg by mouth daily.  . metoprolol succinate (TOPROL XL) 25 MG 24 hr tablet Take 1 tablet (25 mg total) by mouth daily.  . mirtazapine (REMERON) 15 MG tablet Take 15 mg by mouth at bedtime.  . Multiple Vitamin  (MULTIVITAMIN) capsule Take 1 capsule by mouth daily.  . nitroGLYCERIN (NITROSTAT) 0.4 MG SL tablet Place 1 tablet (0.4 mg total) under the tongue every 5 (five) minutes as needed.  [name] OLANZapine (ZYPREXA) 2.5 MG tablet Take 2.5 mg by mouth at bedtime.  . OXYGEN Inhale 3 L into the lungs daily.   . OXYGEN Inhale 2.5 L into the lungs daily.  . risperiDONE (RISPERDAL) 1 MG tablet Take 1 mg by mouth daily.  . sucralfate (CARAFATE) 1 g tablet Take 1 g by mouth 4 (four) times daily.  [name] tiotropium (SPIRIVA HANDIHALER) 18 MCG inhalation capsule Place 18 mcg into inhaler and inhale daily.       Objective:   Physical Exam BP 120/70 (BP Location: Left Arm, Cuff Size: Normal)   Pulse 97   Temp 97.7 F (36.5 C) (Temporal)   Ht 5\' 10"  (1.778 m)   Wt 187 lb (84.8 kg)   SpO2 97%   BMI 26.83 kg/m  GENERAL: Well-developed well-nourished gentleman, chronic use of accessories, no conversational dyspnea. No acute distress.   Fully ambulatory.Using oxygen portable concentrator at 3 L pulsed. HEAD: Normocephalic, atraumatic.  EYES: Pupils equal, round, reactive to light. No scleral icterus.  MOUTH: Nose/mouth/throat not examined due to masking requirements for COVID 19. NECK: Supple. No thyromegaly. No nodules. No  JVD. Trachea midline, no crepitus. PULMONARY: Very distant breath sounds, no adventitious sounds. CARDIOVASCULAR: S1 and S2. Regular rate and rhythm. No rubs murmurs or gallops heard. GASTROINTESTINAL: Slightly protuberant abdomen, soft, benign. MUSCULOSKELETAL: No joint deformity, no clubbing, no edema.  NEUROLOGIC: Awake, alert, oriented. No overt focal deficits. Speech is fluent. SKIN: Intact,warm,dry. Limited exam: No rashes. PSYCH: Normal mood and behavior.      Assessment & Plan:     ICD-10-CM   1. Stage 4 very severe COPD by GOLD classification ([place])  J44.9 Pulmonary Function Test [facility] Only   Change to Breztri 2 puffs twice a day Discontinue Advair and Spiriva PFTs  2.  Pulmonary nodules/lesions, multiple  R91.8 CT CHEST WO CONTRAST   Prior history of lung nodules Will check CT chest If stable no further imaging necessary  3. Gastroesophageal reflux disease, unspecified whether esophagitis present  K21.9    Omeprazole 40 mg daily Discontinue Carafate   Orders Placed This Encounter  Procedures  . CT CHEST WO CONTRAST    Next available.    Standing Status:   Future    Standing Expiration Date:   01/14/2022    Order Specific Question:   Preferred imaging location?    Answer:   [facility]  . Pulmonary Function Test [facility] Only    Standing Status:   Future    Standing Expiration Date:   01/14/2022    Order Specific Question:   Full PFT: includes the following: basic spirometry, spirometry pre & post bronchodilator, diffusion capacity (DLCO), lung volumes    Answer:   Full PFT   Meds ordered this encounter  Medications  . Budeson-Glycopyrrol-Formoterol (BREZTRI AEROSPHERE) 160-9-4.8 MCG/ACT AERO    Sig: Inhale 2 puffs into the lungs 2 (two) times daily.    Dispense:  5.9 g    Refill:  0    Order Specific Question:   Lot Number?    Answer:   [id number]    Order Specific Question:   Expiration Date?    Answer:   04/19/2022    Order Specific Question:   Manufacturer?    Answer:   [employer] [[id number]]    Order Specific Question:   Quantity    Answer:   2  . omeprazole (PRILOSEC) 40 MG capsule    Sig: Take 1 capsule (40 mg total) by mouth daily.    Dispense:  30 capsule    Refill:  6   Discussion:  Patient has noted "burning sensation" on his left chest.  Cardiology evaluation consistent with atypical/noncardiac chest pain.  Suspect the patient has difficulties with GERD which he has had previously.  Carafate is not the best antireflux medication therefore we will switch the patient to omeprazole 40 mg daily.  We are giving him a trial of Breztri 2 puffs twice a day with a spacer to see if this mode of medication delivery is better for him.  He is to  withhold use of Advair and Spiriva while using Breztri.  He is to let us know how this does for him.  We are scheduling PFTs which he has been unable to do previously.  We will order CT chest to follow-up on his pulmonary nodules however, if the CT chest is stable or improved from prior no further imaging will be necessary.  We will see the patient in follow-up in 4 to 6 weeks time he is to contact us prior to that time should any new difficulties arise.  Renold Don, MD [facility]   *  This note was dictated using voice recognition software/Dragon.  Despite best efforts to proofread, errors can occur which can change the meaning.  Any change was purely unintentional.

## 2021-01-15 ENCOUNTER — Encounter: Payer: Self-pay | Admitting: Radiation Oncology

## 2021-01-15 ENCOUNTER — Other Ambulatory Visit: Payer: Self-pay | Admitting: *Deleted

## 2021-01-15 ENCOUNTER — Other Ambulatory Visit: Payer: Self-pay

## 2021-01-15 ENCOUNTER — Encounter: Payer: Self-pay | Admitting: Pulmonary Disease

## 2021-01-15 ENCOUNTER — Ambulatory Visit
Admission: RE | Admit: 2021-01-15 | Discharge: 2021-01-15 | Disposition: A | Discharge status: Home / Self Care | Payer: Medicare Other | Source: Ambulatory Visit | Attending: Radiation Oncology | Admitting: Radiation Oncology

## 2021-01-15 ENCOUNTER — Ambulatory Visit: Payer: Medicare Other | Admitting: Radiation Oncology

## 2021-01-15 DIAGNOSIS — C61 Malignant neoplasm of prostate: Secondary | ICD-10-CM | POA: Insufficient documentation

## 2021-01-20 ENCOUNTER — Ambulatory Visit: Payer: Medicare Other | Admitting: Urology

## 2021-01-21 ENCOUNTER — Ambulatory Visit
Admission: RE | Admit: 2021-01-21 | Discharge: 2021-01-21 | Disposition: A | Discharge status: Home / Self Care | Payer: Medicare Other | Source: Ambulatory Visit | Attending: Pulmonary Disease | Admitting: Pulmonary Disease

## 2021-01-21 ENCOUNTER — Other Ambulatory Visit: Payer: Self-pay

## 2021-01-21 DIAGNOSIS — R918 Other nonspecific abnormal finding of lung field: Secondary | ICD-10-CM | POA: Insufficient documentation

## 2021-01-21 IMAGING — CT CT CHEST W/O CM
1 series · 15 of 34 positions shown, 19 images · non-contrast
Comparison: Chest CT scans [DATE] and [DATE].

CLINICAL DATA: Followup pulmonary nodules.

EXAM:
CT CHEST WITHOUT CONTRAST
TECHNIQUE: Multidetector CT imaging of the chest was performed following the
standard protocol without IV contrast.

[Series 2: thorax · axial · 0.82mm/px · z∈[-917,-627]mm · 15 of 171 slices shown, 19 images]
[im 13/171  mediastinal]
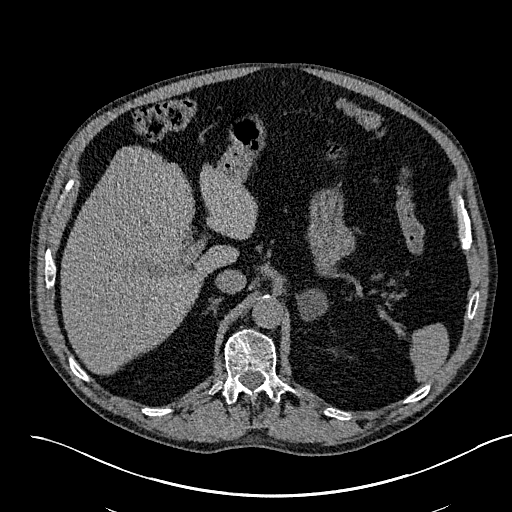
[im 13/171  lung]
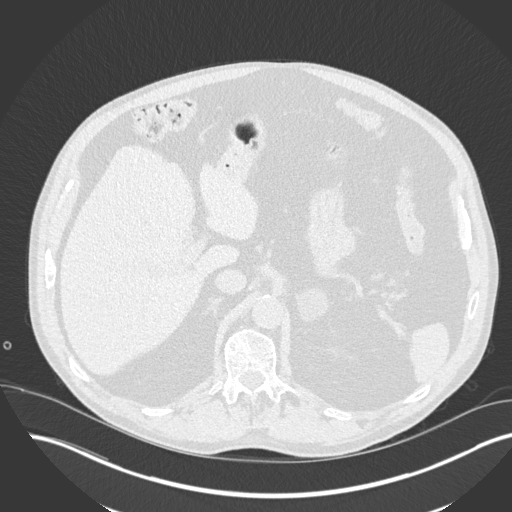
[im 26/171  lung]
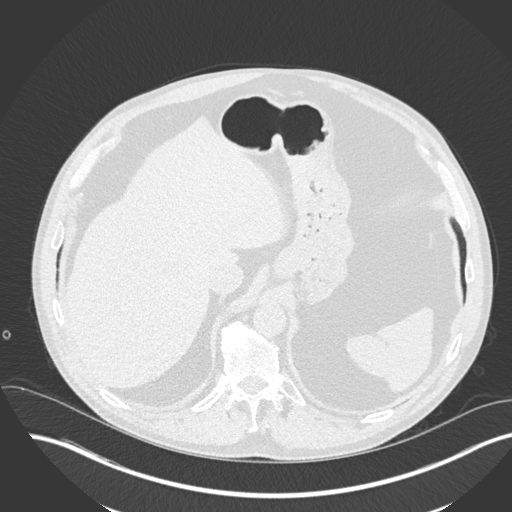
[im 35/171  lung]
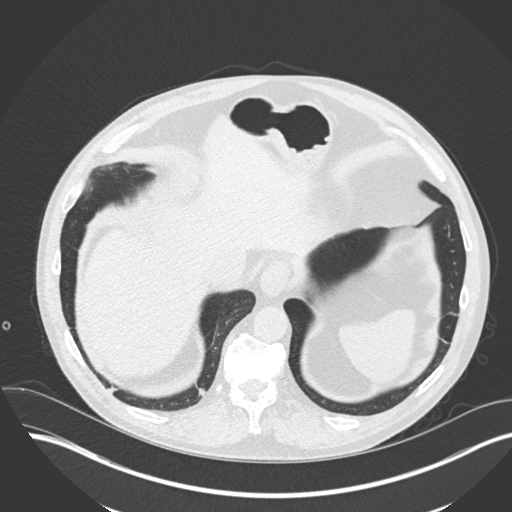
[im 45/171  lung]
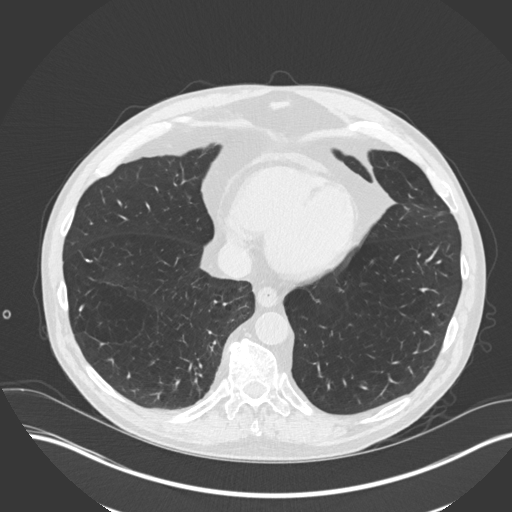
[im 57/171  mediastinal]
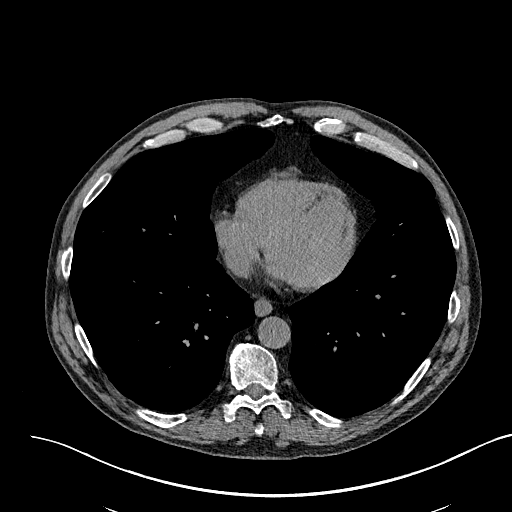
[im 57/171  lung]
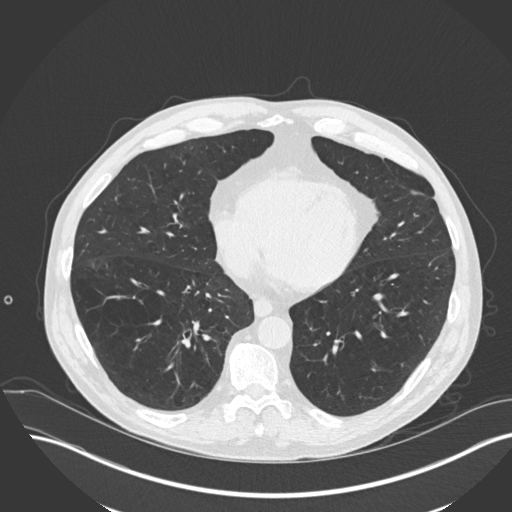
[im 69/171  lung]
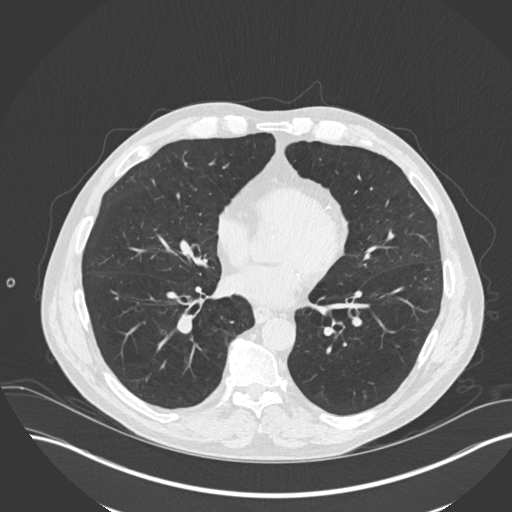
[im 76/171  lung]
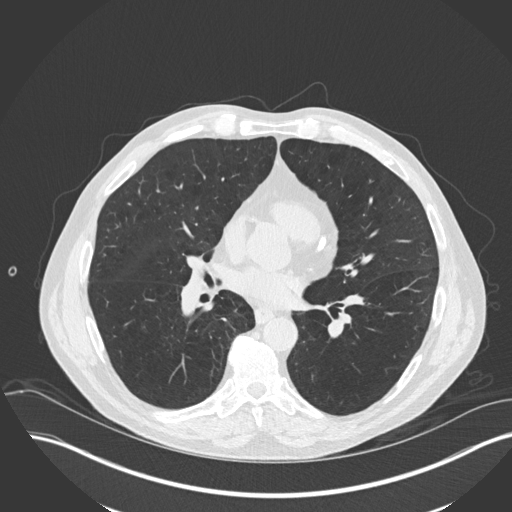
[im 89/171  lung]
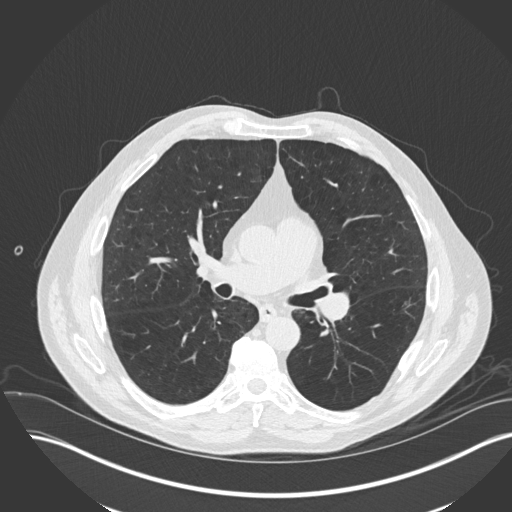
[im 95/171  mediastinal]
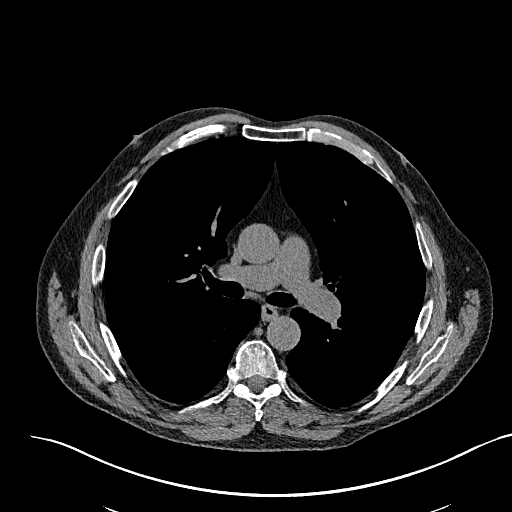
[im 95/171  lung]
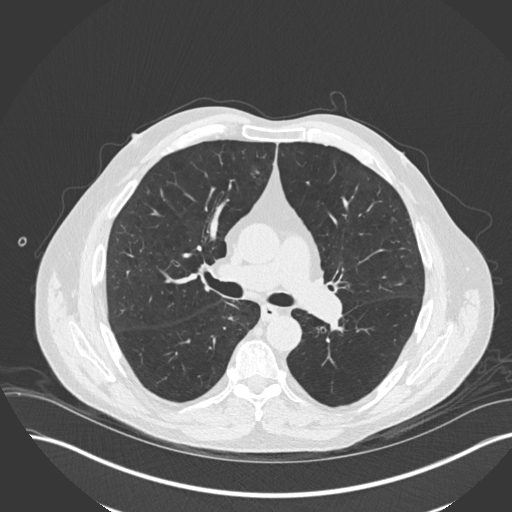
[im 103/171  lung]
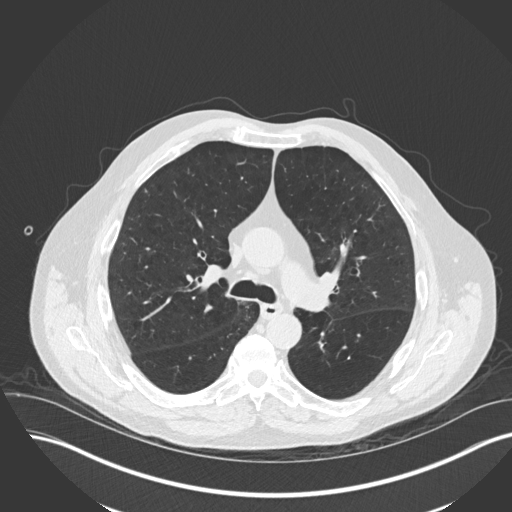
[im 114/171  lung]
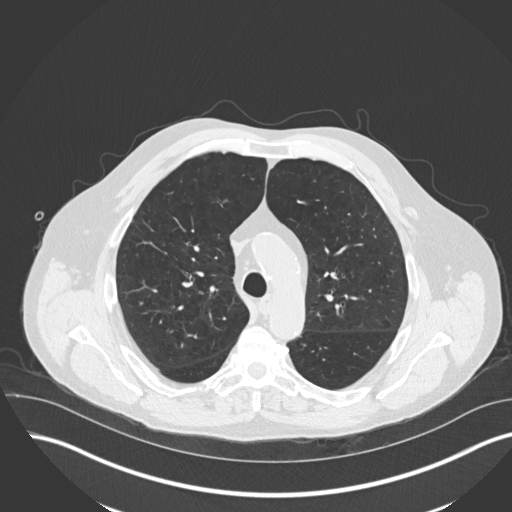
[im 126/171  lung]
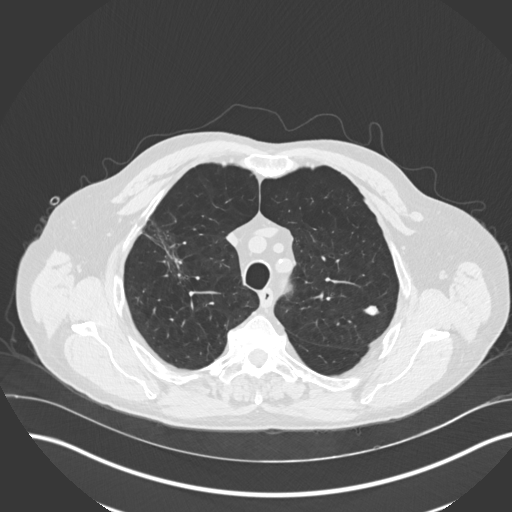
[im 137/171  mediastinal]
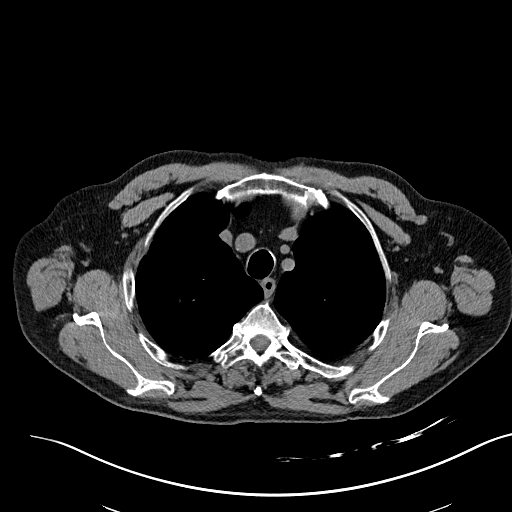
[im 137/171  lung]
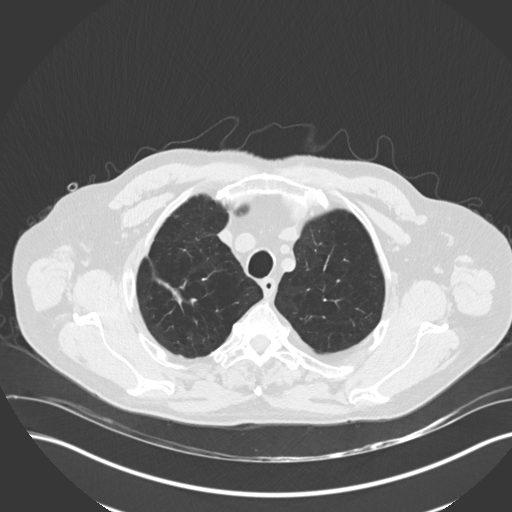
[im 145/171  lung]
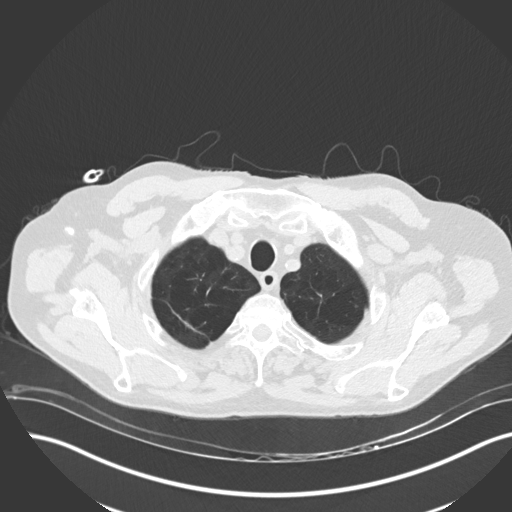
[im 158/171  lung]
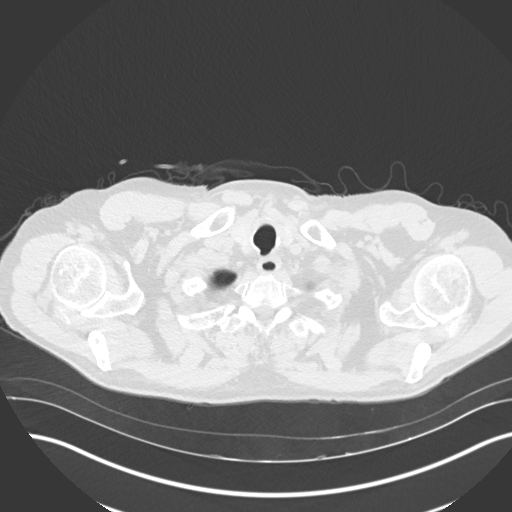

[15 of 34 positions shown; findings below may reference images not displayed]

FINDINGS: Cardiovascular: The heart is normal in size. No pericardial
effusion. The aorta is normal in caliber. Stable atherosclerotic
calcifications. Stable branch vessel calcifications including
three-vessel coronary artery calcifications.

Mediastinum/Nodes: Small scattered mediastinal and hilar lymph nodes
are stable. No mass or adenopathy. The esophagus is grossly normal.

Lungs/Pleura: Stable emphysematous changes and areas of pulmonary
scarring most notable in the right upper lobe.

No acute overlying pulmonary process.  No infiltrates or effusions.

Stable bilateral calcified granulomas. Increasing calcification
noted in the left upper lobe pulmonary nodule.

Branching high attenuation material in the right lower lobe is most
likely embolic phenomenon if the patient has had a prior vertebral
plasty.

Upper Abdomen: Stable calcified granulomas in the spleen.

Stable bilateral adrenal gland adenomas.

Musculoskeletal: Stable rounded calcified density in the right
axilla, likely a calcified lymph node. The bony structures are
unremarkable.
IMPRESSION: 1. Stable emphysematous changes and areas of pulmonary scarring.
2. Stable bilateral calcified granulomas. No new worrisome pulmonary
lesions or acute pulmonary findings.
3. Increasing calcification of the left upper lobe pulmonary nodule.
4. Branching high attenuation material in the right lower lobe is
most likely embolic phenomenon if the patient has had a prior
vertebroplasty.
5. Stable bilateral adrenal gland adenomas.
6. Emphysema and aortic atherosclerosis.

Aortic Atherosclerosis ([SC]-[SC]) and Emphysema ([SC]-[SC]).

## 2021-01-26 ENCOUNTER — Telehealth: Payer: Self-pay

## 2021-01-26 NOTE — Telephone Encounter (Signed)
Patient is aware of date/time of covid test prior to PFT.  

## 2021-01-28 ENCOUNTER — Other Ambulatory Visit
Admission: RE | Admit: 2021-01-28 | Discharge: 2021-01-28 | Disposition: A | Discharge status: Home / Self Care | Payer: Medicare Other | Source: Ambulatory Visit | Attending: Pulmonary Disease | Admitting: Pulmonary Disease

## 2021-01-28 ENCOUNTER — Other Ambulatory Visit: Payer: Self-pay

## 2021-01-28 DIAGNOSIS — Z01812 Encounter for preprocedural laboratory examination: Secondary | ICD-10-CM | POA: Diagnosis present

## 2021-01-28 DIAGNOSIS — Z20822 Contact with and (suspected) exposure to covid-19: Secondary | ICD-10-CM | POA: Insufficient documentation

## 2021-01-28 LAB — SARS CORONAVIRUS 2 (TAT 6-24 HRS): SARS Coronavirus 2: NEGATIVE

## 2021-01-29 ENCOUNTER — Ambulatory Visit: Payer: Medicare Other | Attending: Pulmonary Disease

## 2021-01-29 DIAGNOSIS — J449 Chronic obstructive pulmonary disease, unspecified: Secondary | ICD-10-CM | POA: Diagnosis not present

## 2021-01-29 DIAGNOSIS — Z7951 Long term (current) use of inhaled steroids: Secondary | ICD-10-CM | POA: Diagnosis not present

## 2021-01-29 MED ORDER — ALBUTEROL SULFATE (2.5 MG/3ML) 0.083% IN NEBU
2.5000 mg | INHALATION_SOLUTION | Freq: Once | RESPIRATORY_TRACT | Status: AC
  Administered 2021-01-29: 2.5 mg via RESPIRATORY_TRACT
  Filled 2021-01-29: qty 3

## 2021-01-30 LAB — PULMONARY FUNCTION TEST ARMC ONLY
DL/VA % pred: 45 %
DL/VA: 1.88 ml/min/mmHg/L
DLCO unc % pred: 21 %
DLCO unc: 5.57 ml/min/mmHg
FEF 25-75 Post: 0.25 L/sec
FEF 25-75 Pre: 0.3 L/sec
FEF2575-%Change-Post: -14 %
FEF2575-%Pred-Post: 9 %
FEF2575-%Pred-Pre: 11 %
FEV1-%Change-Post: -8 %
FEV1-%Pred-Post: 20 %
FEV1-%Pred-Pre: 22 %
FEV1-Post: 0.68 L
FEV1-Pre: 0.74 L
FEV1FVC-%Change-Post: -4 %
FEV1FVC-%Pred-Pre: 52 %
FEV6-%Change-Post: -6 %
FEV6-%Pred-Post: 38 %
FEV6-%Pred-Pre: 41 %
FEV6-Post: 1.66 L
FEV6-Pre: 1.77 L
FEV6FVC-%Change-Post: -2 %
FEV6FVC-%Pred-Post: 95 %
FEV6FVC-%Pred-Pre: 97 %
FVC-%Change-Post: -4 %
FVC-%Pred-Post: 40 %
FVC-%Pred-Pre: 42 %
FVC-Post: 1.83 L
Post FEV1/FVC ratio: 37 %
Post FEV6/FVC ratio: 91 %
Pre FEV1/FVC ratio: 39 %
Pre FEV6/FVC Ratio: 93 %

## 2021-02-03 ENCOUNTER — Emergency Department: Payer: Medicare Other

## 2021-02-03 ENCOUNTER — Telehealth: Payer: Self-pay | Admitting: Pulmonary Disease

## 2021-02-03 ENCOUNTER — Other Ambulatory Visit: Payer: Self-pay

## 2021-02-03 ENCOUNTER — Emergency Department
Admission: EM | Admit: 2021-02-03 | Discharge: 2021-02-03 | Disposition: A | Discharge status: Home / Self Care | Payer: Medicare Other | Attending: Emergency Medicine | Admitting: Emergency Medicine

## 2021-02-03 DIAGNOSIS — R0602 Shortness of breath: Secondary | ICD-10-CM | POA: Diagnosis present

## 2021-02-03 DIAGNOSIS — I5023 Acute on chronic systolic (congestive) heart failure: Secondary | ICD-10-CM | POA: Diagnosis not present

## 2021-02-03 DIAGNOSIS — Z87891 Personal history of nicotine dependence: Secondary | ICD-10-CM | POA: Diagnosis not present

## 2021-02-03 DIAGNOSIS — R079 Chest pain, unspecified: Secondary | ICD-10-CM | POA: Diagnosis not present

## 2021-02-03 DIAGNOSIS — J441 Chronic obstructive pulmonary disease with (acute) exacerbation: Secondary | ICD-10-CM | POA: Diagnosis not present

## 2021-02-03 DIAGNOSIS — I110 Hypertensive heart disease with heart failure: Secondary | ICD-10-CM | POA: Diagnosis not present

## 2021-02-03 DIAGNOSIS — Z79899 Other long term (current) drug therapy: Secondary | ICD-10-CM | POA: Diagnosis not present

## 2021-02-03 DIAGNOSIS — Z8546 Personal history of malignant neoplasm of prostate: Secondary | ICD-10-CM | POA: Diagnosis not present

## 2021-02-03 DIAGNOSIS — Z7982 Long term (current) use of aspirin: Secondary | ICD-10-CM | POA: Insufficient documentation

## 2021-02-03 DIAGNOSIS — Z7951 Long term (current) use of inhaled steroids: Secondary | ICD-10-CM | POA: Diagnosis not present

## 2021-02-03 DIAGNOSIS — I509 Heart failure, unspecified: Secondary | ICD-10-CM

## 2021-02-03 DIAGNOSIS — I25119 Atherosclerotic heart disease of native coronary artery with unspecified angina pectoris: Secondary | ICD-10-CM | POA: Diagnosis not present

## 2021-02-03 LAB — CBC
HCT: 37.7 % — ABNORMAL LOW (ref 39.0–52.0)
Hemoglobin: 11.9 g/dL — ABNORMAL LOW (ref 13.0–17.0)
MCH: 29.3 pg (ref 26.0–34.0)
MCHC: 31.6 g/dL (ref 30.0–36.0)
MCV: 92.9 fL (ref 80.0–100.0)
Platelets: 218 10*3/uL (ref 150–400)
RBC: 4.06 MIL/uL — ABNORMAL LOW (ref 4.22–5.81)
RDW: 12.9 % (ref 11.5–15.5)
WBC: 5.6 10*3/uL (ref 4.0–10.5)
nRBC: 0 % (ref 0.0–0.2)

## 2021-02-03 LAB — BASIC METABOLIC PANEL
Anion gap: 8 (ref 5–15)
BUN: 12 mg/dL (ref 8–23)
CO2: 32 mmol/L (ref 22–32)
Calcium: 8.9 mg/dL (ref 8.9–10.3)
Chloride: 101 mmol/L (ref 98–111)
Creatinine, Ser: 0.95 mg/dL (ref 0.61–1.24)
GFR, Estimated: >60 mL/min (ref >60)
Glucose, Bld: 83 mg/dL (ref 70–99)
Potassium: 3.4 mmol/L — ABNORMAL LOW (ref 3.5–5.1)
Sodium: 141 mmol/L (ref 135–145)

## 2021-02-03 LAB — TROPONIN I (HIGH SENSITIVITY): Troponin I (High Sensitivity): 9 ng/L (ref <18)

## 2021-02-03 IMAGING — CR DG CHEST 2V
1 series · 2 of 2 positions shown · non-contrast
Comparison: [DATE] [DATE]

CLINICAL DATA: Chest discomfort for 5 days, history of coronary
artery disease

EXAM:
CHEST - 2 VIEW

[Series 1: dg chest 2 view · 0.14mm/px · 2 of 2 slices shown]
[im 1/2]
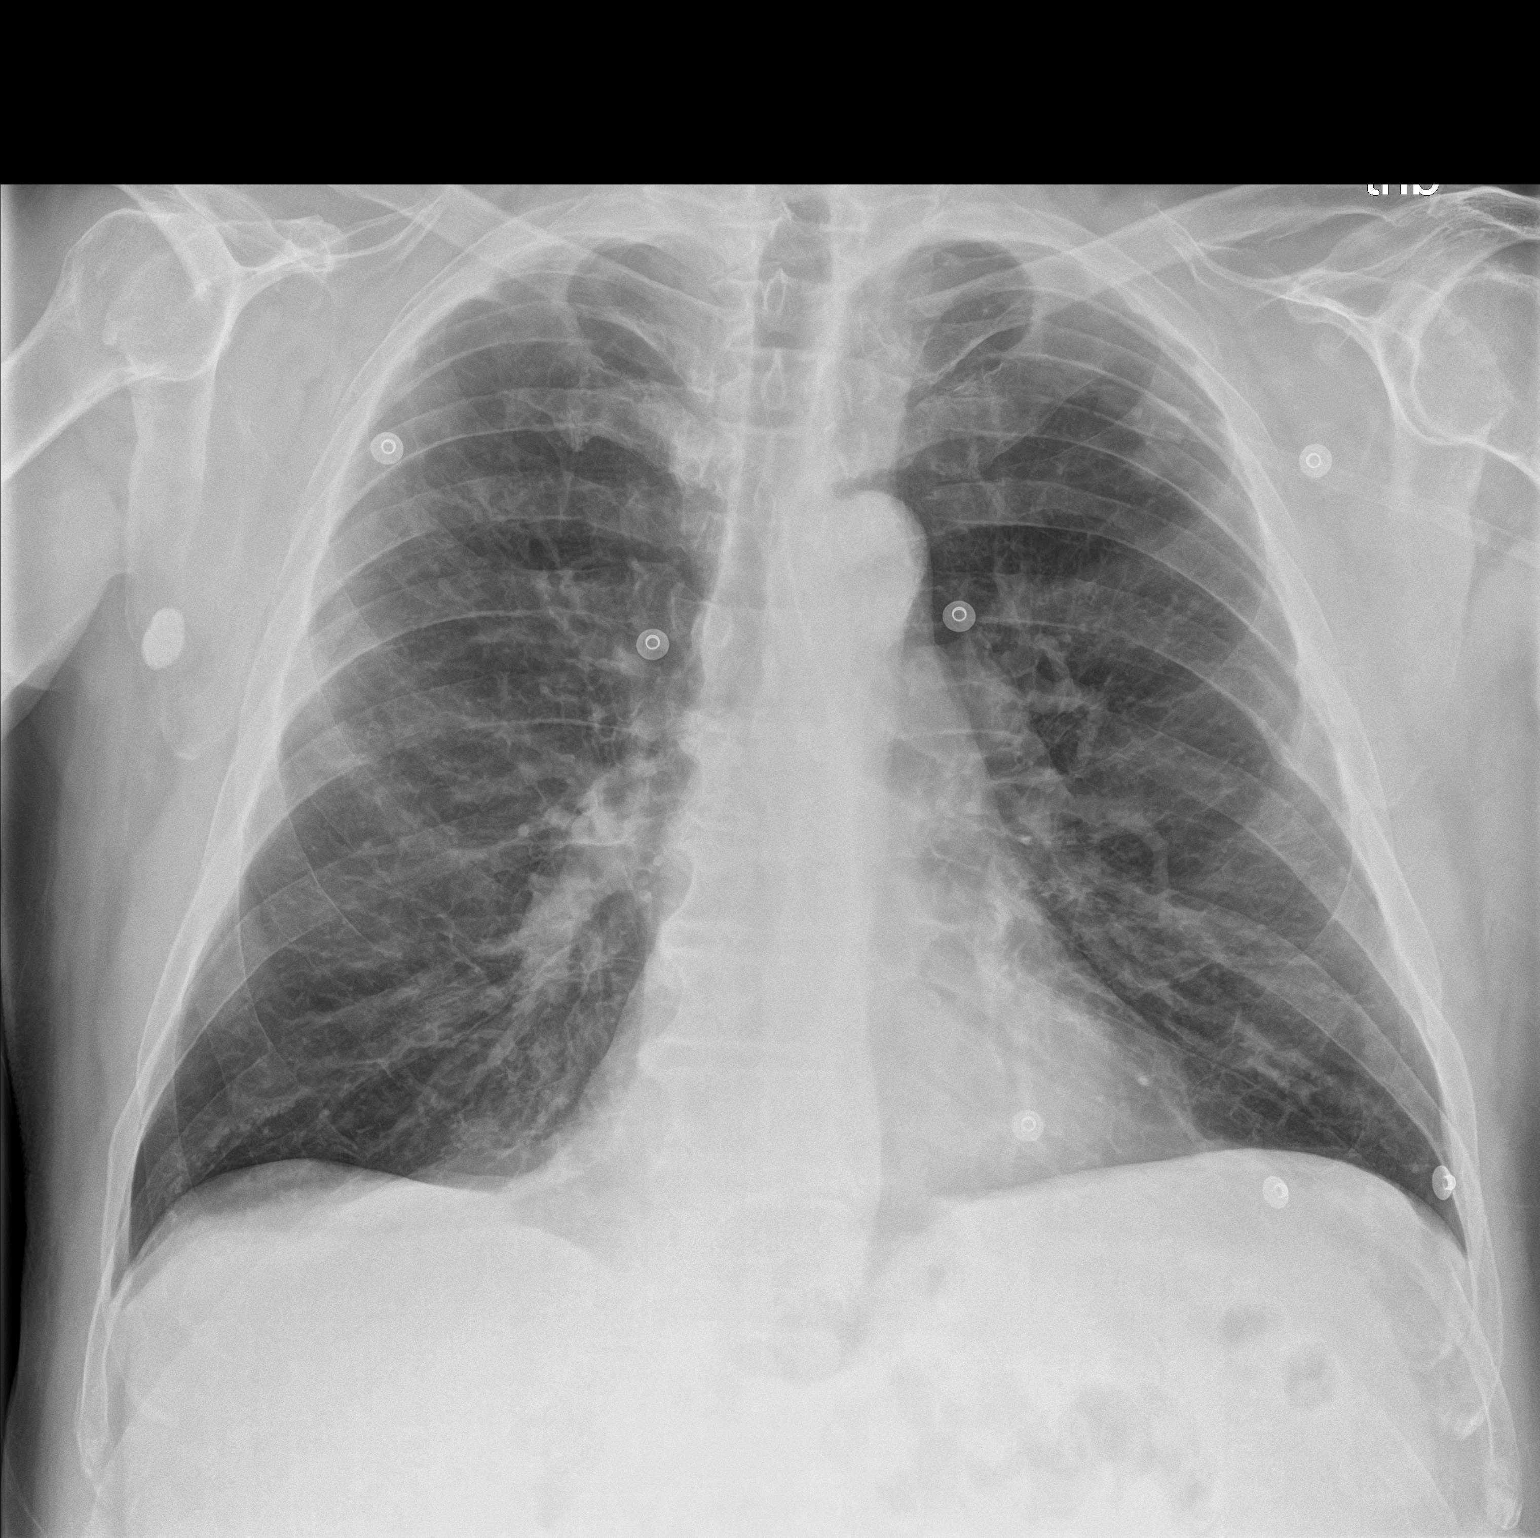
[im 2/2]
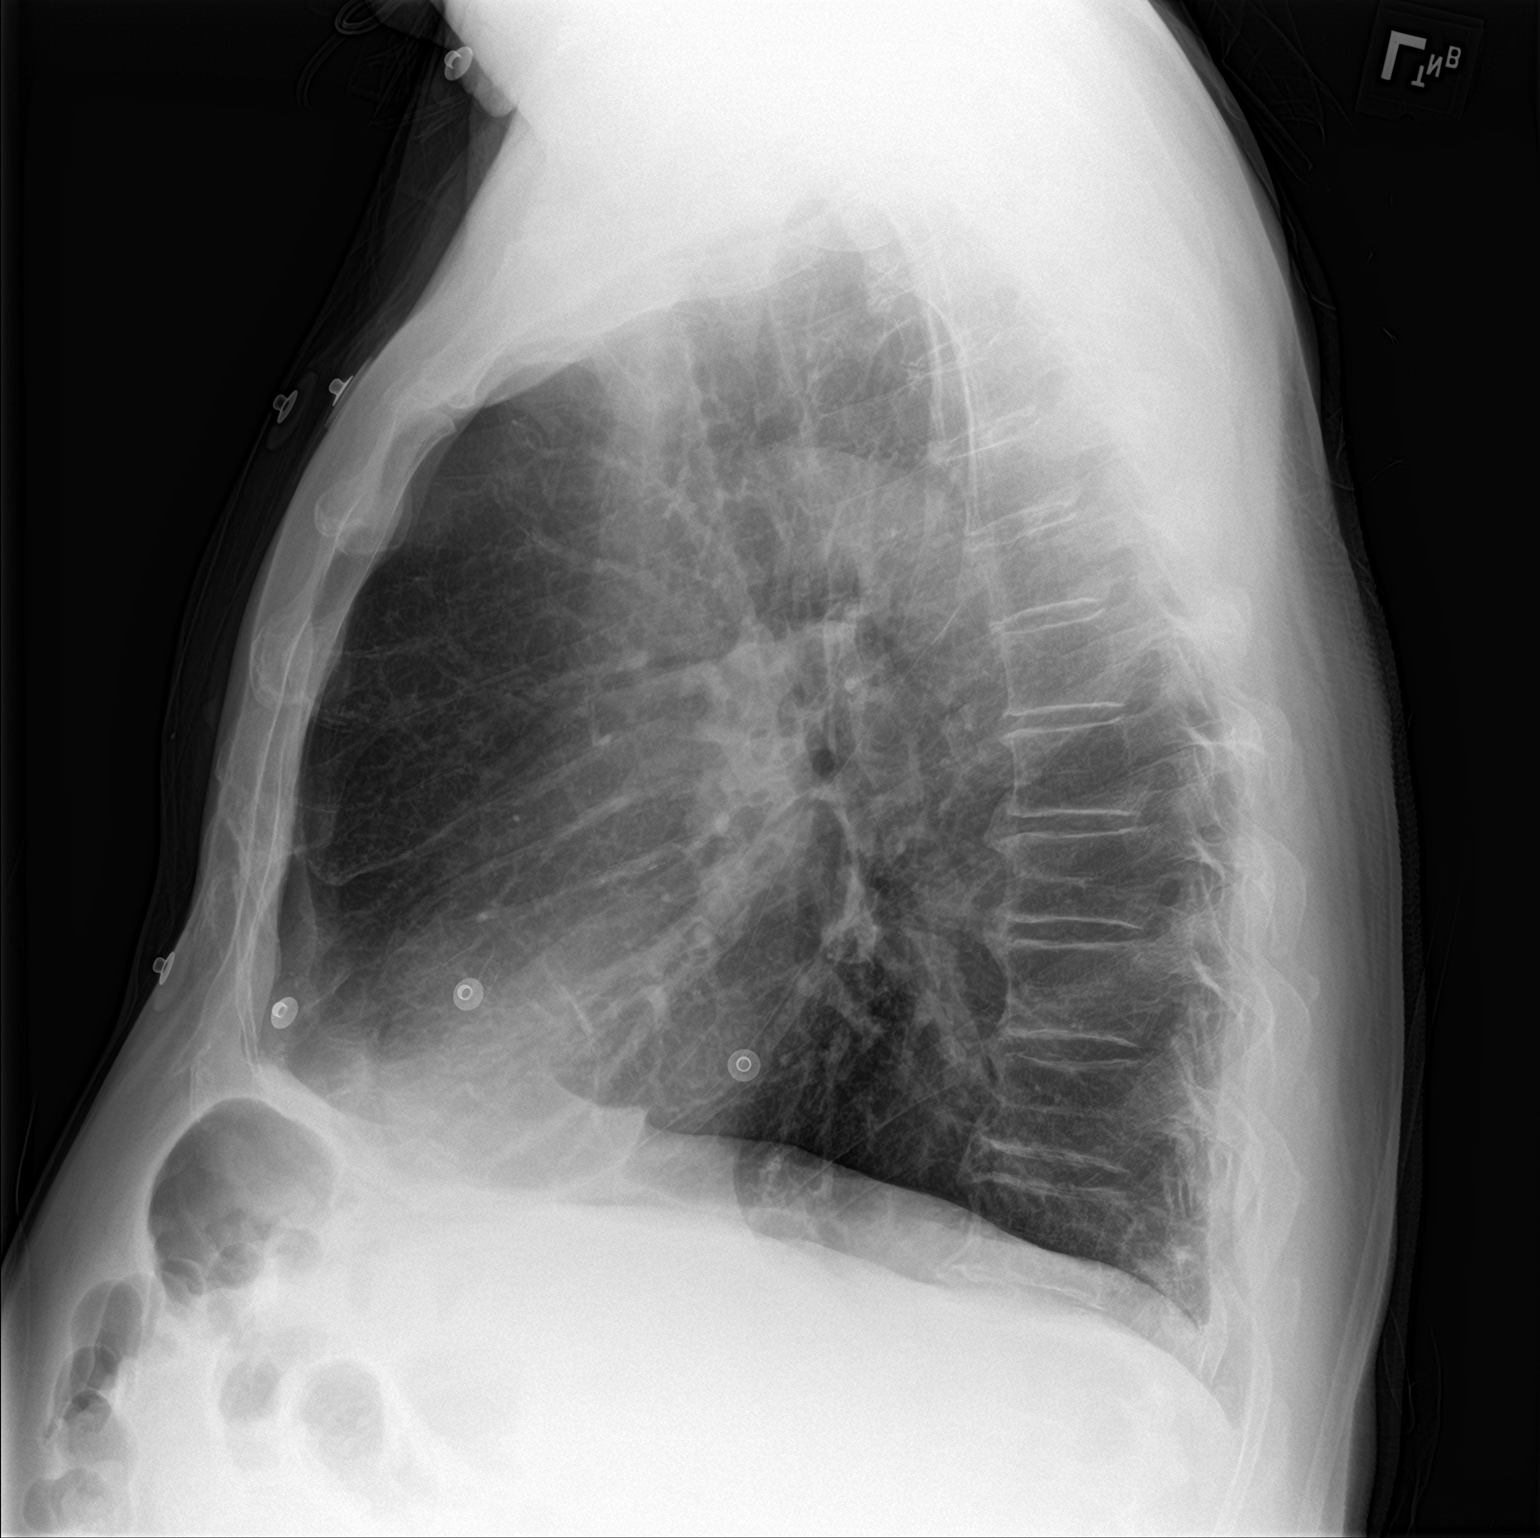

[2 of 2 positions shown; findings below may reference images not displayed]

FINDINGS: Frontal and lateral views of the chest demonstrate an unremarkable
cardiac silhouette. Stable areas of scarring without acute airspace
disease, effusion, or pneumothorax. No acute bony abnormalities.
IMPRESSION: 1. Stable exam, no acute intrathoracic process.

## 2021-02-03 MED ORDER — IPRATROPIUM-ALBUTEROL 0.5-2.5 (3) MG/3ML IN SOLN
3.0000 mL | Freq: Once | RESPIRATORY_TRACT | Status: AC
  Administered 2021-02-03: 3 mL via RESPIRATORY_TRACT
  Filled 2021-02-03: qty 3

## 2021-02-03 MED ORDER — FUROSEMIDE 10 MG/ML IJ SOLN
40.0000 mg | Freq: Once | INTRAMUSCULAR | Status: AC
  Administered 2021-02-03: 40 mg via INTRAVENOUS
  Filled 2021-02-03: qty 4

## 2021-02-03 NOTE — ED Notes (Signed)
Provided DC instructions Verbalized understanding. Wheeled to waiting room for ride.

## 2021-02-03 NOTE — ED Notes (Signed)
First nurse note: Comes EMS with chest discomfort for 5 days burning in center of chest. Hx of two stents. Did not have CP with previous two MIs. Had 324mg  aspirin. Chronic 2L [place].

## 2021-02-03 NOTE — ED Triage Notes (Signed)
See first nurse note. Pt states today he called cardiologist and they recommended to come in. Increase in feet swelling.

## 2021-02-03 NOTE — ED Provider Notes (Signed)
[facility] Emergency Department Provider Note   ____________________________________________   Event Date/Time   First MD Initiated Contact with Patient 02/03/21 2030     (approximate)  I have reviewed the triage vital signs and the nursing notes.   HISTORY  Chief Complaint Chest Pain    HPI Leslie Sims is a 70 y.o. male with past medical history of hypertension, hyperlipidemia, CAD, CHF, and COPD on 3 L nasal cannula who presents to the ED complaining of shortness of breath.  Patient reports that for the past week or so he has had increasing difficulty breathing.  Breathing difficulty is mild at rest but is much worse when he gets up to walk around.  He has also noticed some swelling in both of his legs, despite taking his regular daily dose of Lasix.  He complains of some occasional burning pain in his chest, but none currently.  He denies any fevers or cough.  He spoke with his pulmonologist earlier today, who recommended he come to the ED to be evaluated.        Past Medical History:  Diagnosis Date  . Allergic rhinitis   . Anxiety   . Back pain   . Cancer [facility])    prostate  . Colon polyps 08/2012   colonoscopy; multiple colon polyps; repeat colonoscopy in one year.  [name] COPD (chronic obstructive pulmonary disease) ([place])    a. on home O2.  . Coronary artery disease 11/2009   a. late presenting ant MI; b. LHC 100% pLAD s/p PCI/DES, 99% mRCA s/p PCI/DES, EF 35%; c. nuclear stress test 05/13: prior ant/inf infarcts w/o ischemia, EF 43%; d. LHC 02/14: widely patent stents with no other obs dz, EF 40%; e. LHC 11/17: LM nl, mLAD 10%, patent LAD stent, dLAD 20%, p-mRCA 10%, mRCA 40%, patent RCA stent, EF 35-45%   . Depression   . GERD (gastroesophageal reflux disease)   . Headache(784.0)   . Helicobacter pylori (H. pylori)   . Hemorrhoids   . Hernia    inguinal  . HFrEF (heart failure with reduced ejection fraction) ([place])    a. 10/2016 LV  gram: EF 35-45%; b. 06/2018 Echo: EF 40-45%.  . Hyperlipidemia   . Hypertension   . Insomnia   . Ischemic cardiomyopathy    a. 10/2016 LV gram: EF 35-45%; b. 06/2018 Echo: EF 40-45%.  . Myalgia   . Peptic ulcer   . Pulmonary nodules   . ST elevation (STEMI) myocardial infarction involving left anterior descending coronary artery ([place]) 11/2009   a. s/p PCI to the LAD  . Status post dilation of esophageal narrowing 2000  . Tinea pedis   . Wears dentures    partial upper    Patient Active Problem List   Diagnosis Date Noted  . Elevated PSA 07/28/2020  . Iron deficiency anemia 05/20/2020  . Goals of care, counseling/discussion   . Palliative care by specialist   . Healthcare-associated pneumonia 01/28/2019  . COPD with acute exacerbation ([place]) 12/31/2018  . Generalized anxiety disorder 11/12/2018  . Moderate episode of recurrent major depressive disorder ([place]) 11/12/2018  . Acute respiratory failure with hypoxia ([place]) 09/29/2018  . Acute respiratory failure with hypoxia and hypercapnia ([place]) 09/13/2018  . Acute on chronic respiratory failure with hypoxia and hypercapnia ([place]) 08/24/2018  . Chest pain 07/02/2018  . Grief reaction 01/13/2018  . COPD with hypoxia ([place]) 12/25/2017  . Unstable angina ([place])   . Benign neoplasm of ascending colon   .  Benign neoplasm of descending colon   . Benign neoplasm of sigmoid colon   . History of colonic polyps   . Pulmonary nodules/lesions, multiple 01/05/2015  . Bruit of left carotid artery 11/04/2014  . Sleep disorder 07/18/2013  . Seasonal and perennial allergic rhinitis 05/29/2013  . Bradycardia 04/20/2011  . SMOKER 07/30/2010  . CAD, NATIVE VESSEL 04/23/2010  . Hyperlipidemia 03/24/2010  . DEPRESSION/ANXIETY 03/24/2010  . Chronic systolic heart failure ([place]) 03/24/2010  . COPD, very severe ([place]) 03/24/2010  . GERD 03/24/2010  . Essential hypertension 11/21/2009    Past Surgical History:  Procedure Laterality Date  . Admission   12/20/2012   COPD exacerbation.  [place].  [name] CARDIAC CATHETERIZATION    . CARDIAC CATHETERIZATION  02/05/13   [facility]  . CARDIAC CATHETERIZATION  10/14   [facility] : patent stents with no change in anatomy. EF: 40$  . CARDIAC CATHETERIZATION Left 11/12/2016   Procedure: Left Heart Cath and Coronary Angiography;  Surgeon: Wellington Hampshire, MD;  Location: [place] [facility];  Service: Cardiovascular;  Laterality: Left;  . COLONOSCOPY    . COLONOSCOPY WITH PROPOFOL N/A 05/18/2016   Procedure: COLONOSCOPY WITH PROPOFOL;  Surgeon: Lucilla Lame, MD;  Location: [facility] ENDOSCOPY;  Service: Endoscopy;  Laterality: N/A;  . COLONOSCOPY WITH PROPOFOL N/A 03/19/2020   Procedure: COLONOSCOPY WITH PROPOFOL;  Surgeon: Lin Landsman, MD;  Location: [facility] ENDOSCOPY;  Service: Gastroenterology;  Laterality: N/A;  . CORONARY ANGIOPLASTY WITH STENT PLACEMENT  09/2009   LAD 3.0 X23 mm Xience DES, RCA: 4.0 X 15 mm Xience DES  . ELECTROMAGNETIC NAVIGATION BROCHOSCOPY N/A 10/27/2017   Procedure: ELECTROMAGNETIC NAVIGATION BRONCHOSCOPY;  Surgeon: Flora Lipps, MD;  Location: [facility] ORS;  Service: Cardiopulmonary;  Laterality: N/A;  . ESOPHAGEAL DILATION    . ESOPHAGOGASTRODUODENOSCOPY  2008  . ESOPHAGOGASTRODUODENOSCOPY  08/20/2012  . HERNIA REPAIR  07/20/2012   L inguinal hernia repair  . PENILE PROSTHESIS IMPLANT      Prior to Admission medications   Medication Sig Start Date End Date Taking? Authorizing Provider  albuterol (PROVENTIL HFA;VENTOLIN HFA) 108 (90 Base) MCG/ACT inhaler Inhale 2 puffs into the lungs every 6 (six) hours as needed for wheezing or shortness of breath. 11/01/18   Rudene Re, MD  aspirin 81 MG tablet Take 81 mg by mouth daily.    [provider]  atorvastatin (LIPITOR) 40 MG tablet Take 1 tablet (40 mg total) by mouth daily. 03/03/18   [name], MD  Budeson-Glycopyrrol-Formoterol (BREZTRI AEROSPHERE) 160-9-4.8 MCG/ACT AERO Inhale 2 puffs into the lungs 2 (two) times daily. 01/14/21    [name], MD  Fluticasone-Salmeterol (ADVAIR) 500-50 MCG/DOSE AEPB Inhale 1 puff into the lungs every 12 (twelve) hours. 11/29/18   [provider]  furosemide (LASIX) 20 MG tablet Take 20 mg by mouth daily.     [provider]  ipratropium-albuterol (DUONEB) 0.5-2.5 (3) MG/3ML SOLN USE 1 VIAL VIA NEBULIZER AND INHALE BY MOUTH 4 TIMES A DAY 01/21/20   [name], MD  LORazepam (ATIVAN) 1 MG tablet Take 1 tablet (1 mg total) by mouth every 4 (four) hours while awake. 02/14/19   [name], MD  losartan (COZAAR) 25 MG tablet Take 25 mg by mouth daily. 02/23/19   [provider]  metoprolol succinate (TOPROL XL) 25 MG 24 hr tablet Take 1 tablet (25 mg total) by mouth daily. 01/07/21   Wellington Hampshire, MD  mirtazapine (REMERON) 15 MG tablet Take 15 mg by mouth at bedtime. 06/02/20  [provider]  Multiple Vitamin (MULTIVITAMIN) capsule Take 1 capsule by mouth daily.    [provider]  nitroGLYCERIN (NITROSTAT) 0.4 MG SL tablet Place 1 tablet (0.4 mg total) under the tongue every 5 (five) minutes as needed. 06/04/20   Loel Dubonnet, NP  OLANZapine (ZYPREXA) 2.5 MG tablet Take 2.5 mg by mouth at bedtime.    [provider]  omeprazole (PRILOSEC) 40 MG capsule Take 1 capsule (40 mg total) by mouth daily. 01/14/21   [name], MD  OXYGEN Inhale 3 L into the lungs daily.     [provider]  OXYGEN Inhale 2.5 L into the lungs daily.    [provider]  sucralfate (CARAFATE) 1 g tablet Take 1 g by mouth 4 (four) times daily. 06/03/20   [provider]  tiotropium (SPIRIVA HANDIHALER) 18 MCG inhalation capsule Place 18 mcg into inhaler and inhale daily.    [provider]  VIIBRYD 20 MG TABS Take 1 tablet by mouth daily. 01/06/21   [provider]    Allergies Prozac [fluoxetine hcl], Effexor xr [venlafaxine hcl er], and Wellbutrin [bupropion]  Family History  Problem  Relation Age of Onset  . Heart attack Brother        Brother #1  . Diabetes Brother   . Hypertension Brother        #3  . Coronary artery disease Father 46       deceased  . Heart attack Father   . Diabetes Father   . Heart disease Father   . COPD Mother 30       deceased  . Alcohol abuse Sister        polysubstance abuse  . COPD Sister   . Lung cancer Sister   . Alcohol abuse Sister        polysubstance abuse  . Penile cancer Brother   . Diabetes Brother   . Prostate cancer Neg Hx   . Bladder Cancer Neg Hx   . Kidney cancer Neg Hx     Social History Social History   Tobacco Use  . Smoking status: Former Smoker    Packs/day: 3.00    Years: 42.00    Pack years: 126.00    Types: Cigarettes    Quit date: 02/10/2019    Years since quitting: 1.9  . Smokeless tobacco: Never Used  Vaping Use  . Vaping Use: Never used  Substance Use Topics  . Alcohol use: No    Alcohol/week: 0.0 standard drinks  . Drug use: No    Review of Systems  Constitutional: No fever/chills Eyes: No visual changes. ENT: No sore throat. Cardiovascular: Positive for chest pain. Respiratory: Positive for shortness of breath. Gastrointestinal: No abdominal pain.  No nausea, no vomiting.  No diarrhea.  No constipation. Genitourinary: Negative for dysuria. Musculoskeletal: Negative for back pain. Skin: Negative for rash. Neurological: Negative for headaches, focal weakness or numbness.  ____________________________________________   PHYSICAL EXAM:  VITAL SIGNS: ED Triage Vitals  Enc Vitals Group     BP 02/03/21 1750 (!) 135/58     Pulse Rate 02/03/21 1750 74     Resp 02/03/21 1750 18     Temp 02/03/21 1750 98.6 F (37 C)     Temp Source 02/03/21 1750 Oral     SpO2 02/03/21 1750 98 %     Weight 02/03/21 1748 187 lb (84.8 kg)     Height 02/03/21 1747 5\' 10"  (1.778 m)  Head Circumference --      Peak Flow --      Pain Score 02/03/21 1747 1     Pain Loc --      Pain Edu? --       Excl. in [place]? --     Constitutional: Alert and oriented. Eyes: Conjunctivae are normal. Head: Atraumatic. Nose: No congestion/rhinnorhea. Mouth/Throat: Mucous membranes are moist. Neck: Normal ROM Cardiovascular: Normal rate, regular rhythm. Grossly normal heart sounds.  2+ radial pulses bilaterally. Respiratory: Normal respiratory effort.  No retractions. Lungs with Rales to bilateral bases and mild end expiratory wheezing. Gastrointestinal: Soft and nontender. No distention. Genitourinary: deferred Musculoskeletal: No lower extremity tenderness, trace pitting edema to mid shins bilaterally. Neurologic:  Normal speech and language. No gross focal neurologic deficits are appreciated. Skin:  Skin is warm, dry and intact. No rash noted. Psychiatric: Mood and affect are normal. Speech and behavior are normal.  ____________________________________________   LABS (all labs ordered are listed, but only abnormal results are displayed)  Labs Reviewed  BASIC METABOLIC PANEL - Abnormal; Notable for the following components:      Result Value   Potassium 3.4 (*)    All other components within normal limits  CBC - Abnormal; Notable for the following components:   RBC 4.06 (*)    Hemoglobin 11.9 (*)    HCT 37.7 (*)    All other components within normal limits  TROPONIN I (HIGH SENSITIVITY)   ____________________________________________  EKG  ED ECG REPORT I, Blake Divine, the attending physician, personally viewed and interpreted this ECG.   Date: 02/03/2021  EKG Time: 17:40  Rate: 82  Rhythm: normal sinus rhythm, ventricular trigeminy  Axis: Normal  Intervals:nonspecific intraventricular conduction delay  ST&T Change: None   PROCEDURES  Procedure(s) performed (including Critical Care):  Procedures   ____________________________________________   INITIAL IMPRESSION / ASSESSMENT AND PLAN / ED COURSE       70 year old male with past medical history of hypertension,  hyperlipidemia, CAD, CHF, and COPD on 3 L who presents to the ED with increasing dyspnea on exertion along with burning discomfort in his chest for about the past 5 days.  EKG shows ventricular trigeminy with no ischemic changes, no trigeminy or frequent PVCs now noted on cardiac monitor.  Patient is maintaining O2 sats on his usual 3 L nasal cannula, not in any respiratory distress.  He does appear mildly fluid overloaded with edema to his lower extremities, although chest x-ray reviewed by me and shows no infiltrate, edema, or effusion.  We will give IV dose of Lasix as well as DuoNeb for mild wheezing.  Labs are reassuring and troponin within normal limits, low suspicion for ACS or PE.  Patient will be appropriate for discharge home with close cardiology follow-up if symptoms are improved.  Patient has diuresed following dose of Lasix, states he feels much better after DuoNeb.  He is appropriate for discharge home with cardiology follow-up, was counseled to take additional Lasix dose if he notices ongoing swelling or increase in weight.  He was counseled to return to the ED for new worsening symptoms, patient agrees with plan.      ____________________________________________   FINAL CLINICAL IMPRESSION(S) / ED DIAGNOSES  Final diagnoses:  Acute on chronic congestive heart failure, unspecified heart failure type ([place])  Nonspecific chest pain     ED Discharge Orders         Ordered    AMB referral to CHF clinic  02/03/21 2209           Note:  This document was prepared using Dragon voice recognition software and may include unintentional dictation errors.   Blake Divine, MD 02/03/21 2224

## 2021-02-04 ENCOUNTER — Telehealth: Payer: Self-pay | Admitting: Family

## 2021-02-04 NOTE — Telephone Encounter (Signed)
Patient asked to speak to cardiologist about CHF Referral before scheduling appointment.   Kristeen Lantz, NT

## 2021-02-06 ENCOUNTER — Other Ambulatory Visit: Payer: Self-pay

## 2021-02-06 ENCOUNTER — Encounter: Payer: Self-pay | Admitting: Nurse Practitioner

## 2021-02-06 ENCOUNTER — Ambulatory Visit (INDEPENDENT_AMBULATORY_CARE_PROVIDER_SITE_OTHER): Payer: Medicare Other | Admitting: Nurse Practitioner

## 2021-02-06 VITALS — BP 112/76 | HR 75 | Ht 70.0 in | Wt 193.0 lb

## 2021-02-06 DIAGNOSIS — I5023 Acute on chronic systolic (congestive) heart failure: Secondary | ICD-10-CM

## 2021-02-06 DIAGNOSIS — I2510 Atherosclerotic heart disease of native coronary artery without angina pectoris: Secondary | ICD-10-CM | POA: Diagnosis not present

## 2021-02-06 DIAGNOSIS — I10 Essential (primary) hypertension: Secondary | ICD-10-CM | POA: Diagnosis not present

## 2021-02-06 DIAGNOSIS — J449 Chronic obstructive pulmonary disease, unspecified: Secondary | ICD-10-CM

## 2021-02-06 DIAGNOSIS — I255 Ischemic cardiomyopathy: Secondary | ICD-10-CM | POA: Diagnosis not present

## 2021-02-06 DIAGNOSIS — E785 Hyperlipidemia, unspecified: Secondary | ICD-10-CM

## 2021-02-06 MED ORDER — FUROSEMIDE 40 MG PO TABS: 40.0000 mg | ORAL_TABLET | Freq: Every day | ORAL | 3 refills | Status: DC

## 2021-02-06 MED ORDER — BISOPROLOL FUMARATE 5 MG PO TABS: 2.5000 mg | ORAL_TABLET | Freq: Every day | ORAL | 11 refills | Status: DC

## 2021-02-06 NOTE — Progress Notes (Signed)
Office Visit    Patient Name: Leslie Sims Date of Encounter: 02/06/2021  Primary Care Provider:  [name], MD Primary Cardiologist:  Kathlyn Sacramento, MD  Chief Complaint    70 y/o ? with a history of coronary artery disease, ischemic cardiomyopathy, HFrEF, COPD on home O2, prolonged history of previous tobacco abuse, anxiety, depression, and hyperlipidemia, who presents for follow-up related to recent increase in dyspnea with wheezing and lower extremity edema.  Past Medical History    Past Medical History:  Diagnosis Date  . Allergic rhinitis   . Anxiety   . Back pain   . [facility])    prostate  . Colon polyps 08/2012   colonoscopy; multiple colon polyps; repeat colonoscopy in one year.  [name] COPD (chronic obstructive pulmonary disease) ([place])    a. on home O2.  . Coronary artery disease 11/2009   a. late presenting ant MI; b. LHC 100% pLAD s/p PCI/DES, 99% mRCA s/p PCI/DES, EF 35%; c. nuclear stress test 05/13: prior ant/inf infarcts w/o ischemia, EF 43%; d. LHC 02/14: widely patent stents with no other obs dz, EF 40%; e. LHC 11/17: LM nl, mLAD 10%, patent LAD stent, dLAD 20%, p-mRCA 10%, mRCA 40%, patent RCA stent, EF 35-45%   . Depression   . GERD (gastroesophageal reflux disease)   . Headache(784.0)   . Helicobacter pylori (H. pylori)   . Hemorrhoids   . Hernia    inguinal  . HFrEF (heart failure with reduced ejection fraction) ([place])    a. 10/2016 LV gram: EF 35-45%; b. 06/2018 Echo: EF 40-45%.  . Hyperlipidemia   . Hypertension   . Insomnia   . Ischemic cardiomyopathy    a. 10/2016 LV gram: EF 35-45%; b. 06/2018 Echo: EF 40-45%.  . Myalgia   . Peptic ulcer   . Pulmonary nodules   . ST elevation (STEMI) myocardial infarction involving left anterior descending coronary artery ([place]) 11/2009   a. s/p PCI to the LAD  . Status post dilation of esophageal narrowing 2000  . Tinea pedis   . Wears dentures    partial upper   Past Surgical History:   Procedure Laterality Date  . Admission  12/20/2012   COPD exacerbation.  [place].  [name] CARDIAC CATHETERIZATION    . CARDIAC CATHETERIZATION  02/05/13   [facility]  . CARDIAC CATHETERIZATION  10/14   [facility] : patent stents with no change in anatomy. EF: 40$  . CARDIAC CATHETERIZATION Left 11/12/2016   Procedure: Left Heart Cath and Coronary Angiography;  Surgeon: Wellington Hampshire, MD;  Location: [place] [facility];  Service: Cardiovascular;  Laterality: Left;  . COLONOSCOPY    . COLONOSCOPY WITH PROPOFOL N/A 05/18/2016   Procedure: COLONOSCOPY WITH PROPOFOL;  Surgeon: Lucilla Lame, MD;  Location: [facility] ENDOSCOPY;  Service: Endoscopy;  Laterality: N/A;  . COLONOSCOPY WITH PROPOFOL N/A 03/19/2020   Procedure: COLONOSCOPY WITH PROPOFOL;  Surgeon: Lin Landsman, MD;  Location: [facility] ENDOSCOPY;  Service: Gastroenterology;  Laterality: N/A;  . CORONARY ANGIOPLASTY WITH STENT PLACEMENT  09/2009   LAD 3.0 X23 mm Xience DES, RCA: 4.0 X 15 mm Xience DES  . ELECTROMAGNETIC NAVIGATION BROCHOSCOPY N/A 10/27/2017   Procedure: ELECTROMAGNETIC NAVIGATION BRONCHOSCOPY;  Surgeon: Flora Lipps, MD;  Location: [facility] ORS;  Service: Cardiopulmonary;  Laterality: N/A;  . ESOPHAGEAL DILATION    . ESOPHAGOGASTRODUODENOSCOPY  2008  . ESOPHAGOGASTRODUODENOSCOPY  08/20/2012  . HERNIA REPAIR  07/20/2012   L inguinal hernia repair  . PENILE PROSTHESIS IMPLANT  Allergies  Allergies  Allergen Reactions  . Prozac [Fluoxetine Hcl] Shortness Of Breath  . Effexor Xr [Venlafaxine Hcl Er] Other (See Comments)    "Makes me feel funny"  . Wellbutrin [Bupropion] Other (See Comments)    "Makes me feel funny"    History of Present Illness    70 y/o ? with the above past medical history including CAD, ischemic cardiomyopathy, HFrEF, COPD on home O2, prolonged history of previous tobacco abuse, anxiety, depression, and hyperlipidemia.  He suffered an anterior myocardial infarction 2010 with finding of an occluded LAD and a 99%  stenosis in the RCA.  He was treated with drug-eluting stent placement to the LAD and RCA at that time.  He subsequently underwent repeat catheterization November 2017 which showed patent LAD and RCA stents with an EF of 35 to 40%.  He had multiple hospitalizations in 2020 for COPD exacerbations but was finally able to quit smoking since been maintained on 2 L of O2 at home.  In 2021, he was diagnosed with prostate cancer and underwent radiation.  He was last seen in cardiology clinic on January 07, 2021, at which time he reported burning sensation in his chest similar to prior GERD.  It was also recently noted that he was in ventricular bigeminy at a primary care visit.  He only had 1 PVC on ECG at cardiology visit and denied palpitations.  He was started on Toprol-XL 25 mg daily in the setting of known cardiomyopathy.  Unfortunately, he was seen in the emergency department on February 15 with complaints of worsening dyspnea on exertion x5 days.  He was noted to have lower extremity edema.  Chest x-ray did not show any infiltrate, edema, or effusion.  ECG showed freq PVCs.  He was treated with a dose of IV Lasix as well as DuoNeb for mild wheezing.  HsTrop was nl.  He was d/c'd home and advised to f/u w/ cardiology.  Since his ED visit, breathing has improved though is not back to baseline.  He still notes wheezing, which has been bothersome.  In thinking about it, he thinks wheezing probably started shortly after being placed on metoprolol therapy.  He is unaware of any dietary indiscretion that might have led to increase in swelling.  He denies chest pain, palpitations, PND, orthopnea, dizziness, syncope, or early satiety.  His weight is up 7 pounds since his last visit.  Home Medications    Prior to Admission medications   Medication Sig Start Date End Date Taking? Authorizing Provider  albuterol (PROVENTIL HFA;VENTOLIN HFA) 108 (90 Base) MCG/ACT inhaler Inhale 2 puffs into the lungs every 6 (six) hours  as needed for wheezing or shortness of breath. 11/01/18   Rudene Re, MD  aspirin 81 MG tablet Take 81 mg by mouth daily.    [provider]  atorvastatin (LIPITOR) 40 MG tablet Take 1 tablet (40 mg total) by mouth daily. 03/03/18   [name], MD  Budeson-Glycopyrrol-Formoterol (BREZTRI AEROSPHERE) 160-9-4.8 MCG/ACT AERO Inhale 2 puffs into the lungs 2 (two) times daily. 01/14/21   [name], MD  Fluticasone-Salmeterol (ADVAIR) 500-50 MCG/DOSE AEPB Inhale 1 puff into the lungs every 12 (twelve) hours. 11/29/18   [provider]  furosemide (LASIX) 20 MG tablet Take 20 mg by mouth daily.     [provider]  ipratropium-albuterol (DUONEB) 0.5-2.5 (3) MG/3ML SOLN USE 1 VIAL VIA NEBULIZER AND INHALE BY MOUTH 4 TIMES A DAY 01/21/20   [name], MD  LORazepam (ATIVAN) 1 MG tablet Take 1 tablet (1 mg total) by mouth every 4 (four) hours while awake. 02/14/19   [name], MD  losartan (COZAAR) 25 MG tablet Take 25 mg by mouth daily. 02/23/19   [provider]  metoprolol succinate (TOPROL XL) 25 MG 24 hr tablet Take 1 tablet (25 mg total) by mouth daily. 01/07/21   Wellington Hampshire, MD  mirtazapine (REMERON) 15 MG tablet Take 15 mg by mouth at bedtime. 06/02/20   [provider]  Multiple Vitamin (MULTIVITAMIN) capsule Take 1 capsule by mouth daily.    [provider]  nitroGLYCERIN (NITROSTAT) 0.4 MG SL tablet Place 1 tablet (0.4 mg total) under the tongue every 5 (five) minutes as needed. 06/04/20   Loel Dubonnet, NP  OLANZapine (ZYPREXA) 2.5 MG tablet Take 2.5 mg by mouth at bedtime.    [provider]  omeprazole (PRILOSEC) 40 MG capsule Take 1 capsule (40 mg total) by mouth daily. 01/14/21   [name], MD  OXYGEN Inhale 3 L into the lungs daily.     [provider]  OXYGEN Inhale 2.5 L into the lungs daily.    [provider]  sucralfate (CARAFATE) 1 g tablet Take 1 g by mouth 4  (four) times daily. 06/03/20   [provider]  tiotropium (SPIRIVA HANDIHALER) 18 MCG inhalation capsule Place 18 mcg into inhaler and inhale daily.    [provider]  VIIBRYD 20 MG TABS Take 1 tablet by mouth daily. 01/06/21   [provider]    Review of Systems    Still notes wheezing and dyspnea on exertion above that of his baseline levels.  Mild ankle edema.  He denies chest pain, palpitations, PND, orthopnea, dizziness, syncope, or early satiety.  All other systems reviewed and are otherwise negative except as noted above.  Physical Exam    VS:  BP 112/76 (BP Location: Left Arm, Patient Position: Sitting, Cuff Size: Normal)   Pulse 75   Ht 5\' 10"  (1.778 m)   Wt 193 lb (87.5 kg)   SpO2 98% Comment: oxygen 3 liters  BMI 27.69 kg/m  , BMI Body mass index is 27.69 kg/m. GEN: Well nourished, well developed, in no acute distress. HEENT: normal. Neck: Supple, moderately elevated JVD P, no carotid bruits, or masses. Cardiac: RRR, distant heart sounds, no murmurs, rubs, or gallops. No clubbing, cyanosis, 1+ bimalleolar ankle edema.  Radials/PT 1+ and equal bilaterally.  Respiratory:  Respirations regular and unlabored, diminished breath sounds bilaterally with faint expiratory wheezing anteriorly. GI: Firm and protuberant, nontender, BS + x 4.  Mild flank edema. MS: no deformity or atrophy. Skin: warm and dry, no rash. Neuro:  Strength and sensation are intact. Psych: Normal affect.  Accessory Clinical Findings    ECG personally reviewed by me today -regular sinus rhythm, 75, leftward axis, incomplete right bundle branch block, lateral ST depression, prior septal infarct- no acute changes.  Lab Results  Component Value Date   WBC 5.6 02/03/2021   HGB 11.9 (L) 02/03/2021   HCT 37.7 (L) 02/03/2021   MCV 92.9 02/03/2021   PLT 218 02/03/2021   Lab Results  Component Value Date   CREATININE 0.95 02/03/2021   BUN 12 02/03/2021   NA 141 02/03/2021   K  3.4 (L) 02/03/2021   CL 101 02/03/2021   CO2 32 02/03/2021   Lab Results  Component Value Date   ALT 19 07/28/2020   AST 19 07/28/2020  ALKPHOS 57 07/28/2020   BILITOT 0.7 07/28/2020   Lab Results  Component Value Date   CHOL 143 07/27/2017   HDL 62 07/27/2017   LDLCALC 67 07/27/2017   TRIG 72 07/27/2017   CHOLHDL 2.3 07/27/2017    Lab Results  Component Value Date   HGBA1C 6.2 (H) 01/30/2019    Assessment & Plan    1.  Acute on chronic heart failure with reduced EF/ischemic cardiomyopathy: EF 40 to 45% by echo in July 2019.  He was recently seen in January and was doing reasonably well but after being placed on beta-blocker therapy started noticing increasing wheezing and dyspnea as well as weight gain and volume excess.  He was seen in the emergency department earlier this month and was treated with IV Lasix and DuoNeb.  Troponin was normal.  Since his ER visit, breathing is improved but not at baseline.  He continues to note wheezing.  His weight is up 7 pounds since his last visit and has evidence of volume overload on examination today with mild ankle edema and a fairly tight abdomen.  I have asked him to increase his Lasix to 40 mg daily.  In the setting of worsening dyspnea and wheezing since starting beta-blocker therapy, I have asked him to hold metoprolol.  If wheezing improves, I would plan to add bisoprolol 2.5 mg daily given low EF and recent PVCs.  Finally, in the setting of dyspnea with volume overload and recent PVCs, we will go ahead and follow-up echocardiogram.  2.  Chronic respiratory failure/COPD on home O2: Increased dyspnea and wheezing since starting metoprolol.  I have asked him to hold this.  He will follow up with primary care regarding COPD and inhaler therapy.  Once wheezing clears, would like to try bisoprolol therapy as outlined above.  3.  PVCs: No PVCs noted on ECG today.  He was not previously symptomatic.  As above, holding metoprolol and will try  bisoprolol once wheezing clears.  If he were to develop wheezing on bisoprolol, is likely he will not tolerate any beta-blocker.  4.  Coronary artery disease: Status post prior LAD and RCA stents with patent stents in November 2017.  He has not been having any chest pain.  We will follow-up echo in the setting of increasing heart failure symptoms recently.  He remains on aspirin, statin.  Holding beta-blocker for the time being.  5.  Essential hypertension: Stable on beta-blocker and ARB.  Holding beta-blocker for the time being.  6.  Hyperlipidemia: LDL of 67 in 2018.  I do not see anything more recent than that.  We can arrange for follow-up lipids when I see him back in about 2 weeks.  7.  Disposition: Follow-up in clinic in 2 weeks.  Follow-up echo.   Murray Hodgkins, NP 02/06/2021, 6:40 PM

## 2021-02-06 NOTE — Patient Instructions (Signed)
Medication Instructions:   Your physician has recommended you make the following change in your medication:   STOP Metoprolol Succinate   INCREASE Furosemide to 40mg  DAILY - A new Rx has been sent to your pharmacy   - you may take 2 tablets daily of original prescription (20mg ) until you fill new Rx  START Bisoprolol 2.5mg  DAILY - Rx sent for Bisoprolol 5mg  Take HALF tablet DAILY    Lab Work: None ordered   Testing/Procedures:  Your physician has requested that you have an echocardiogram. Echocardiography is a painless test that uses sound waves to create images of your heart. It provides your doctor with information about the size and shape of your heart and how well your heart's chambers and valves are working. This procedure takes approximately one hour. There are no restrictions for this procedure.    Follow-Up: At [facility], you and your health needs are our priority.  As part of our continuing mission to provide you with exceptional heart care, we have created designated Provider Care Teams.  These Care Teams include your primary Cardiologist (physician) and Advanced Practice Providers (APPs -  Physician Assistants and Nurse Practitioners) who all work together to provide you with the care you need, when you need it.  We recommend signing up for the patient portal called "MyChart".  Sign up information is provided on this After Visit Summary.  MyChart is used to connect with patients for Virtual Visits (Telemedicine).  Patients are able to view lab/test results, encounter notes, upcoming appointments, etc.  Non-urgent messages can be sent to your provider as well.   To learn more about what you can do with MyChart, go to [email or web address].    Your next appointment:   2 week(s)  The format for your next appointment:   In Person Provider:   Murray Hodgkins, NP

## 2021-02-10 NOTE — Telephone Encounter (Signed)
Called and spoke to pt. Pt called in with acute s/s on 2/15. Pt did not get a return call until now. Pt went to ED on 2/15 after not hearing back from pulmonary. I apologized to the pt and assured him I would inform my clinical supervisor of the issue and she will look into the root cause. Ashley Akin, RN, has been informed of the situation.  Pt states he is feeling better since visiting the ED on 2/15 and is ok waiting until the 3/30 visit with Dr. Patsey Sims. Pt aware to call if a sooner appt is needed.   Will forward to Dr. Patsey Sims as Leslie Sims.

## 2021-02-11 ENCOUNTER — Other Ambulatory Visit: Payer: Medicare Other

## 2021-02-11 NOTE — Telephone Encounter (Signed)
Sorry he had issues.  Glad he is feeling better.

## 2021-02-11 NOTE — Telephone Encounter (Signed)
Patient is aware of below message. He voiced his understanding and had no further questions.  Nothing further needed.

## 2021-02-13 ENCOUNTER — Ambulatory Visit (INDEPENDENT_AMBULATORY_CARE_PROVIDER_SITE_OTHER): Payer: Medicare Other

## 2021-02-13 ENCOUNTER — Other Ambulatory Visit: Payer: Self-pay

## 2021-02-13 DIAGNOSIS — I5023 Acute on chronic systolic (congestive) heart failure: Secondary | ICD-10-CM

## 2021-02-13 LAB — ECHOCARDIOGRAM COMPLETE
AR max vel: 3.57 cm2
AV Area VTI: 2.93 cm2
AV Area mean vel: 3.26 cm2
AV Mean grad: 3 mmHg
AV Peak grad: 5.6 mmHg
Ao pk vel: 1.18 m/s
Area-P 1/2: 3.5 cm2
Calc EF: 39.1 %
MV VTI: 4.17 cm2
Single Plane A2C EF: 36.9 %
Single Plane A4C EF: 42.7 %

## 2021-02-20 ENCOUNTER — Ambulatory Visit (INDEPENDENT_AMBULATORY_CARE_PROVIDER_SITE_OTHER): Payer: Medicare Other | Admitting: Nurse Practitioner

## 2021-02-20 ENCOUNTER — Other Ambulatory Visit: Payer: Self-pay

## 2021-02-20 ENCOUNTER — Encounter: Payer: Self-pay | Admitting: Nurse Practitioner

## 2021-02-20 ENCOUNTER — Other Ambulatory Visit: Payer: Self-pay | Admitting: Pulmonary Disease

## 2021-02-20 VITALS — BP 110/60 | HR 63 | Ht 70.0 in | Wt 188.2 lb

## 2021-02-20 DIAGNOSIS — I255 Ischemic cardiomyopathy: Secondary | ICD-10-CM | POA: Diagnosis not present

## 2021-02-20 DIAGNOSIS — E785 Hyperlipidemia, unspecified: Secondary | ICD-10-CM

## 2021-02-20 DIAGNOSIS — I10 Essential (primary) hypertension: Secondary | ICD-10-CM | POA: Diagnosis not present

## 2021-02-20 DIAGNOSIS — I5020 Unspecified systolic (congestive) heart failure: Secondary | ICD-10-CM

## 2021-02-20 DIAGNOSIS — I2510 Atherosclerotic heart disease of native coronary artery without angina pectoris: Secondary | ICD-10-CM | POA: Diagnosis not present

## 2021-02-20 DIAGNOSIS — J449 Chronic obstructive pulmonary disease, unspecified: Secondary | ICD-10-CM

## 2021-02-20 NOTE — Progress Notes (Signed)
Office Visit    Patient Name: Keishawn Rajewski [facility] Date of Encounter: 02/20/2021  Primary Care Provider:  [name], MD Primary Cardiologist:  Kathlyn Sacramento, MD  Chief Complaint    Jaquawn Saffran is a 70 y/o male with history of HFrEF, ischemic cardiomyopathy, CAD prior RCA & LAD stents, STEMI 2010, prolonged tobacco abuse, COPD, HLD, HTN, anxiety, H pylori, depression who presents today for follow-up of his CHF.   Past Medical History    Past Medical History:  Diagnosis Date  . Allergic rhinitis   . Anxiety   . Back pain   . Cancer [facility])    prostate  . Colon polyps 08/2012   colonoscopy; multiple colon polyps; repeat colonoscopy in one year.  [name] COPD (chronic obstructive pulmonary disease) ([place])    a. on home O2.  . Coronary artery disease 11/2009   a. late presenting ant MI; b. LHC 100% pLAD s/p PCI/DES, 99% mRCA s/p PCI/DES, EF 35%; c. nuclear stress test 05/13: prior ant/inf infarcts w/o ischemia, EF 43%; d. LHC 02/14: widely patent stents with no other obs dz, EF 40%; e. LHC 11/17: LM nl, mLAD 10%, patent LAD stent, dLAD 20%, p-mRCA 10%, mRCA 40%, patent RCA stent, EF 35-45%   . Depression   . GERD (gastroesophageal reflux disease)   . Headache(784.0)   . Helicobacter pylori (H. pylori)   . Hemorrhoids   . Hernia    inguinal  . HFrEF (heart failure with reduced ejection fraction) ([place])    a. 10/2016 LV gram: EF 35-45%; b. 06/2018 Echo: EF 40-45%. c. 01/2021 Echo: EF 40-45%  . Hyperlipidemia   . Hypertension   . Insomnia   . Ischemic cardiomyopathy    a. 10/2016 LV gram: EF 35-45%; b. 06/2018 Echo: EF 40-45%.  . Myalgia   . Peptic ulcer   . Pulmonary nodules   . ST elevation (STEMI) myocardial infarction involving left anterior descending coronary artery ([place]) 11/2009   a. s/p PCI to the LAD  . Status post dilation of esophageal narrowing 2000  . Tinea pedis   . Wears dentures    partial upper   Past Surgical History:  Procedure Laterality Date  .  Admission  12/20/2012   COPD exacerbation.  [place].  [name] CARDIAC CATHETERIZATION    . CARDIAC CATHETERIZATION  02/05/13   [facility]  . CARDIAC CATHETERIZATION  10/14   [facility] : patent stents with no change in anatomy. EF: 40$  . CARDIAC CATHETERIZATION Left 11/12/2016   Procedure: Left Heart Cath and Coronary Angiography;  Surgeon: Wellington Hampshire, MD;  Location: [place] [facility];  Service: Cardiovascular;  Laterality: Left;  . COLONOSCOPY    . COLONOSCOPY WITH PROPOFOL N/A 05/18/2016   Procedure: COLONOSCOPY WITH PROPOFOL;  Surgeon: Lucilla Lame, MD;  Location: [facility] ENDOSCOPY;  Service: Endoscopy;  Laterality: N/A;  . COLONOSCOPY WITH PROPOFOL N/A 03/19/2020   Procedure: COLONOSCOPY WITH PROPOFOL;  Surgeon: Lin Landsman, MD;  Location[facility] ENDOSCOPY;  Service: Gastroenterology;  Laterality: N/A;  . CORONARY ANGIOPLASTY WITH STENT PLACEMENT  09/2009   LAD 3.0 X23 mm Xience DES, RCA: 4.0 X 15 mm Xience DES  . ELECTROMAGNETIC NAVIGATION BROCHOSCOPY N/A 10/27/2017   Procedure: ELECTROMAGNETIC NAVIGATION BRONCHOSCOPY;  Surgeon: Flora Lipps, MD;  Location: [facility] ORS;  Service: Cardiopulmonary;  Laterality: N/A;  . ESOPHAGEAL DILATION    . ESOPHAGOGASTRODUODENOSCOPY  2008  . ESOPHAGOGASTRODUODENOSCOPY  08/20/2012  . HERNIA REPAIR  07/20/2012   L inguinal hernia repair  . PENILE  PROSTHESIS IMPLANT      Allergies  Allergies  Allergen Reactions  . Prozac [Fluoxetine Hcl] Shortness Of Breath  . Effexor Xr [Venlafaxine Hcl Er] Other (See Comments)    "Makes me feel funny"  . Wellbutrin [Bupropion] Other (See Comments)    "Makes me feel funny"    History of Present Illness    Dondre Catalfamo is a 70 y/o male with history of HFrEF, ischemic cardiomyopathy, CAD prior RCA & LAD stents, STEMI 2010, prolonged tobacco abuse, COPD, HLD, HTN, anxiety, H pylori, depression who presents today for follow-up of his CHF. He had STEMI in 2010 and was found to have total occlusion of LAD and 99% stenosis in RCA  which were stented. Repeat cath Nov. 2017 showed patent stents w/EF 35-40%. He has had multiple hospitalizations in 2020 for COPD exacerbations but has quit smoking and is maintained on home O2 at 2 L. He underwent radiation treatment for prostate CA in 2021.  At an office visit in Jan 2022 he was started on Toprol-XL 25 mg daily for ventricular bigeminy found at PCP in the setting of known cardiomyopathy.   He was seen in the ED on 02/03/21 with c/o worsening dyspnea on exertion and lower extremity edema. He received IV Lasix and DuoNeb for mild wheezing. EKG showed frequent PVCs, troponin was nl. Since that ED visit, he felt that breathing had improved but was not back to baseline. He was bothered by wheezing. He felt that his wheezing worsened in the setting of starting metoprolol. His weight was up 7 lbs at that visit. He was prescribed increase in lasix to 40 mg daily and to start bisoprolol 2.5 mg if his wheezing improved and pulmonology was in agreement. He, however, started bisoprolol 2.5 mg the following day.   Today, he reports an improvement from symptoms 2 weeks ago but not back to baseline with his breathing. He continues Lasix 40 mg daily. He is down 5 lbs on our scale. Labs stable last week through PCP office.  He has no lower extremity swelling but his abdomen is distended. He reports regular bowel movements but that the does not put out a large amount of urine following diuretic. Reviewed the findings from recent echo with him and that EF is stable from last echo 2019 (40-45%).  Home Medications    Prior to Admission medications   Medication Sig Start Date End Date Taking? Authorizing Provider  albuterol (PROVENTIL HFA;VENTOLIN HFA) 108 (90 Base) MCG/ACT inhaler Inhale 2 puffs into the lungs every 6 (six) hours as needed for wheezing or shortness of breath. 11/01/18  Yes Alfred Levins, [place], MD  aspirin 81 MG tablet Take 81 mg by mouth daily.   Yes [provider]  atorvastatin  (LIPITOR) 40 MG tablet Take 1 tablet (40 mg total) by mouth daily. 03/03/18  Yes [name], MD  bisoprolol (ZEBETA) 5 MG tablet Take 0.5 tablets (2.5 mg total) by mouth daily. 02/06/21 02/01/22 Yes Theora Gianotti, NP  Budeson-Glycopyrrol-Formoterol (BREZTRI AEROSPHERE) 160-9-4.8 MCG/ACT AERO Inhale 2 puffs into the lungs 2 (two) times daily. 01/14/21  Yes [name], MD  escitalopram (LEXAPRO) 10 MG tablet Take 10 mg by mouth every morning. 01/30/21  Yes [provider]  furosemide (LASIX) 40 MG tablet Take 1 tablet (40 mg total) by mouth daily. 02/06/21 02/01/22 Yes Theora Gianotti, NP  ipratropium-albuterol (DUONEB) 0.5-2.5 (3) MG/3ML SOLN USE 1 VIAL VIA NEBULIZER AND INHALE BY MOUTH 4 TIMES A DAY 02/20/21  Yes  [name], MD  LORazepam (ATIVAN) 1 MG tablet Take 1 tablet (1 mg total) by mouth every 4 (four) hours while awake. 02/14/19  Yes [name], MD  losartan (COZAAR) 25 MG tablet Take 25 mg by mouth daily. 02/23/19  Yes [provider]  mirtazapine (REMERON) 15 MG tablet Take 15 mg by mouth at bedtime. 06/02/20  Yes [provider]  Multiple Vitamin (MULTIVITAMIN) capsule Take 1 capsule by mouth daily.   Yes [provider]  nitroGLYCERIN (NITROSTAT) 0.4 MG SL tablet Place 1 tablet (0.4 mg total) under the tongue every 5 (five) minutes as needed. 06/04/20  Yes Loel Dubonnet, NP  OLANZapine (ZYPREXA) 2.5 MG tablet Take 2.5 mg by mouth at bedtime.   Yes [provider]  omeprazole (PRILOSEC) 40 MG capsule Take 1 capsule (40 mg total) by mouth daily. 01/14/21  Yes [name], MD  OXYGEN Inhale 3 L into the lungs daily.    Yes [provider]  OXYGEN Inhale 2.5 L into the lungs daily.   Yes [provider]  sucralfate (CARAFATE) 1 g tablet Take 1 g by mouth 4 (four) times daily. 06/03/20  Yes [provider]    Review of Systems    Complains of dyspnea, wheezing. Has occasional  burning in chest that is relieved with Carafate. Occasional lightheadedness with position change. No syncope, palpitations, orthopnea, PND, early satiety. All other systems reviewed and are otherwise negative except as noted above.  Physical Exam    VS:   Vitals:   02/20/21 1344  BP: 110/60  Pulse: 63  SpO2: 94%   Body mass index is 27.01 kg/m. GEN: Well nourished, well developed, in no acute distress. HEENT: normal. Neck: Supple, no JVD, carotid bruits, or masses. Cardiac: RRR, distant heart sounds, no murmurs, rubs, or gallops. No clubbing, cyanosis, edema.  Radials/PT 2+ and equal bilaterally.  Respiratory:  Respirations regular and unlabored, diminished breath sounds bilaterally, no wheezes posteriorly, faint exp wheezing auscultated anteriorly GI: semi-firm and protuberant. No flank edema (noted 2 wks ago). Non-tender, BS + x 4. MS: no deformity or atrophy. Skin: warm and dry, no rash. Neuro:  Strength and sensation are intact. Psych: Normal affect.  Accessory Clinical Findings    ECG personally reviewed by me today - RSR @ 63 bpm, left axis deviation, lateral T wave inversion, prior septal infarct - no acute changes from previous ECG from 02/09/21.  Lab Results  Component Value Date   WBC 5.6 02/03/2021   HGB 11.9 (L) 02/03/2021   HCT 37.7 (L) 02/03/2021   MCV 92.9 02/03/2021   PLT 218 02/03/2021   Lab Results  Component Value Date   CHOL 143 07/27/2017   HDL 62 07/27/2017   LDLCALC 67 07/27/2017   TRIG 72 07/27/2017   CHOLHDL 2.3 07/27/2017    Lab Results  Component Value Date   HGBA1C 6.2 (H) 01/30/2019   Labs from [facility] dated February 11, 2001 Sodium 142, potassium 3.6, chloride 100, CO2 31, BUN 13, creatinine 1.1, glucose 132 Calcium 9.0, albumin 3.9, total protein 6.7 Total bilirubin 0.7, alkaline phosphatase 64, AST 26, ALT 29 Serum iron 82, TIBC 463, ferritin 35, percent transferrin saturation 18 BNP 114 Vitamin B12 433  Assessment &  Plan    1.  Acute on chronic heart failure with reduced EF/ischemic cardiomyopathy: LVEF 40-45% on echo Feb. 2022. His weight is down 5 lbs by our scale. No lower extremity edema, though abd remains semi-firm/protuberant.  No change in  orthopnea - he continues to sleep in a recliner. He reports that he is feeling better today than 2 weeks ago but continues to struggle with dyspnea. BNP 2/23 was nl @ 114.  At last visit, metoprolol was stopped due to worsening dyspnea and wheezing and I rx bisoprolol w/ intention for him to start only once wheezing improved.  He says that he started it immediately, thus at this point, it's still unclear to what extent  blocker therapy is contributing to wheezing and worsening dyspnea.  He is not wheezing as much as he was 2 wks ago, but he reports ongoing wheezing @ home.  I've asked him to stop bisoprolol for now and we can readdress use @ f/u.  Cont lasix 40mg  daily. Labs were stable 2/23.  2. Chronic respiratory failure/COPD on home O2:  Given stable volume status, I feel that this is driving his ongoing dyspnea more than anything at this point.  His wheezing is improved on exam today, but given ongoing wheezing and dyspnea, will have him hold bisoprolol for now. He has follow-up scheduled with pulmonary soon. He notes that he went back to using his old inhalers and thinks that they have helped.  3. CAD s/p prior LAD and RCA stents w/patent stents in November 2017: He describes a burning in his chest that is relieved with carafate. He has not taken any nitroglycerin. Denies any additional chest discomfort and dyspnea is stable from previous, not felt to be angina equivalent. Continue statin. Holding  blocker in the setting of continued dyspnea and mild wheezing. No further changes.   4. Essential hypertension: BP soft today. He reports occasional lightheadedness when changing positions. Reports home BP readings usually 322 or > systolic. We are stopping bisoprolol today  in the setting of wheezing and dyspnea. Will continue ARB, diuretic.   5. Hyperlipidemia: Last LDL was 2018. He is not fasting today. Advised he may come to [facility] for fasting lipid panel any day that is convenient. Continue atorvastatin 40 mg.   6. PVCs: no ectopy seen on ecg today. Stopping  blocker in the setting of dyspnea and wheezing.  He denies palpitations or chest discomfort. Will continue to monitor.   7. Disposition: Will check lipid at his convenience within the next month. Has follow-up with Dr. Fletcher Anon in 6 weeks.   Murray Hodgkins, NP 02/20/2021, 5:08 PM

## 2021-02-20 NOTE — Patient Instructions (Signed)
Medication Instructions:  Your physician has recommended you make the following change in your medication:  1- STOP Bisoprolol.  *If you need a refill on your cardiac medications before your next appointment, please call your pharmacy*  Lab Work: Your physician recommends that you return for lab work in: LIPID panel to check your cholesterol at your earliest convenience. - You will need to be fasting. Please do not have anything to eat or drink after midnight the morning you have the lab work. You may only have water or black coffee with no cream or sugar. - Please go to the [facility]. You will check in at the front desk to the right as you walk into the atrium. Valet Parking is offered if needed. - No appointment needed. You may go any day between 7 am and 6 pm.  If you have labs (blood work) drawn today and your tests are completely normal, you will receive your results only by:  [place] (if you have MyChart) OR  A paper copy in the mail If you have any lab test that is abnormal or we need to change your treatment, we will call you to review the results.  Testing/Procedures: none  Follow-Up: At [facility], you and your health needs are our priority.  As part of our continuing mission to provide you with exceptional heart care, we have created designated Provider Care Teams.  These Care Teams include your primary Cardiologist (physician) and Advanced Practice Providers (APPs -  Physician Assistants and Nurse Practitioners) who all work together to provide you with the care you need, when you need it.  We recommend signing up for the patient portal called "MyChart".  Sign up information is provided on this After Visit Summary.  MyChart is used to connect with patients for Virtual Visits (Telemedicine).  Patients are able to view lab/test results, encounter notes, upcoming appointments, etc.  Non-urgent messages can be sent to your provider as well.   To learn more about  what you can do with MyChart, go to [email or web address].    Your next appointment:   Keep appointment as scheduled.   The format for your next appointment:   In Person  Provider:   You may see Kathlyn Sacramento, MD or one of the following Advanced Practice Providers on your designated Care Team:    Murray Hodgkins, NP  Christell Faith, PA-C  Marrianne Mood, PA-C  Cadence [place], [place]  Laurann Montana, NP

## 2021-03-17 ENCOUNTER — Other Ambulatory Visit: Payer: Self-pay

## 2021-03-17 ENCOUNTER — Encounter: Payer: Self-pay | Admitting: Urology

## 2021-03-17 ENCOUNTER — Other Ambulatory Visit
Admission: RE | Admit: 2021-03-17 | Discharge: 2021-03-17 | Disposition: A | Discharge status: Home / Self Care | Payer: Medicare Other | Attending: Urology | Admitting: Urology

## 2021-03-17 ENCOUNTER — Ambulatory Visit (INDEPENDENT_AMBULATORY_CARE_PROVIDER_SITE_OTHER): Payer: Medicare Other | Admitting: Urology

## 2021-03-17 VITALS — BP 161/93 | HR 92 | Ht 70.0 in | Wt 192.0 lb

## 2021-03-17 DIAGNOSIS — C61 Malignant neoplasm of prostate: Secondary | ICD-10-CM | POA: Insufficient documentation

## 2021-03-17 LAB — PSA: Prostatic Specific Antigen: 2.39 ng/mL (ref 0.00–4.00)

## 2021-03-17 NOTE — Progress Notes (Signed)
   03/17/2021 2:36 PM   Leslie Sims [facility] 1951/05/12 [id number]  Reason for visit: Follow up prostate cancer  HPI: He is a 70 year old very comorbid male with history notable for extensive CAD, COPD on oxygen, nonfunctional penile prosthesis, and anxiety and depression who was found to have an elevated PSA of 4.5 and underwent a prostate MRI that showed a 1.6 cm PI-RADS 4 lesion in the left posterior lateral peripheral zone.  This prompted a prostate biopsy which showed 7/12 cores positive for prostate cancer, including 4 cores of Gleason score 3+4=7 prostate adenocarcinoma, max core involvement of 80%, and perineural invasion was present.  He completed external beam radiation in December 2021.  He deferred 6 months of ADT with his comorbidities.  Overall he has been doing well.  He denies any urinary complaints today.  He has had some change in his bowels with some loose stools and diarrhea intermittently over the last few months since radiation.  He has not yet had a PSA drawn post treatment.  We discussed the need for ongoing PSA monitoring.  We will call with PSA results from today.  Plan to follow-up in 6 months with repeat PSA, and if PSA remains low at that time likely continue yearly PSA surveillance.  [place], [facility] [address] [place], [place] [address] [phone]

## 2021-03-18 ENCOUNTER — Other Ambulatory Visit: Payer: Self-pay | Admitting: *Deleted

## 2021-03-18 ENCOUNTER — Encounter: Payer: Self-pay | Admitting: Pulmonary Disease

## 2021-03-18 ENCOUNTER — Other Ambulatory Visit: Payer: Self-pay | Admitting: Pulmonary Disease

## 2021-03-18 ENCOUNTER — Telehealth: Payer: Self-pay

## 2021-03-18 ENCOUNTER — Other Ambulatory Visit
Admission: RE | Admit: 2021-03-18 | Discharge: 2021-03-18 | Disposition: A | Discharge status: Home / Self Care | Payer: Medicare Other | Source: Ambulatory Visit | Attending: Nurse Practitioner | Admitting: Nurse Practitioner

## 2021-03-18 ENCOUNTER — Ambulatory Visit: Payer: Self-pay | Admitting: Urology

## 2021-03-18 ENCOUNTER — Ambulatory Visit (INDEPENDENT_AMBULATORY_CARE_PROVIDER_SITE_OTHER): Payer: Medicare Other | Admitting: Pulmonary Disease

## 2021-03-18 VITALS — BP 118/78 | HR 74 | Temp 97.7°F | Ht 70.0 in | Wt 191.8 lb

## 2021-03-18 DIAGNOSIS — Z87891 Personal history of nicotine dependence: Secondary | ICD-10-CM

## 2021-03-18 DIAGNOSIS — J449 Chronic obstructive pulmonary disease, unspecified: Secondary | ICD-10-CM

## 2021-03-18 DIAGNOSIS — I10 Essential (primary) hypertension: Secondary | ICD-10-CM | POA: Insufficient documentation

## 2021-03-18 DIAGNOSIS — K219 Gastro-esophageal reflux disease without esophagitis: Secondary | ICD-10-CM | POA: Diagnosis not present

## 2021-03-18 DIAGNOSIS — J9611 Chronic respiratory failure with hypoxia: Secondary | ICD-10-CM

## 2021-03-18 DIAGNOSIS — E785 Hyperlipidemia, unspecified: Secondary | ICD-10-CM | POA: Diagnosis present

## 2021-03-18 DIAGNOSIS — R918 Other nonspecific abnormal finding of lung field: Secondary | ICD-10-CM | POA: Diagnosis not present

## 2021-03-18 LAB — LIPID PANEL
Cholesterol: 165 mg/dL (ref 0–200)
HDL: 69 mg/dL (ref >40)
LDL Cholesterol: 81 mg/dL (ref 0–99)
Total CHOL/HDL Ratio: 2.4 RATIO
Triglycerides: 77 mg/dL (ref <150)
VLDL: 15 mg/dL (ref 0–40)

## 2021-03-18 MED ORDER — AZITHROMYCIN 250 MG PO TABS: 250.0000 mg | ORAL_TABLET | ORAL | 6 refills | Status: DC

## 2021-03-18 MED ORDER — ESOMEPRAZOLE MAGNESIUM 40 MG PO CPDR: 40.0000 mg | DELAYED_RELEASE_CAPSULE | Freq: Every day | ORAL | 4 refills | Status: DC

## 2021-03-18 MED ORDER — ATORVASTATIN CALCIUM 80 MG PO TABS: 80.0000 mg | ORAL_TABLET | Freq: Every day | ORAL | 3 refills | Status: AC

## 2021-03-18 MED ORDER — FLUTICASONE-SALMETEROL 500-50 MCG/DOSE IN AEPB: 1.0000 | INHALATION_SPRAY | Freq: Two times a day (BID) | RESPIRATORY_TRACT | 11 refills | Status: DC

## 2021-03-18 MED ORDER — TIOTROPIUM BROMIDE MONOHYDRATE 18 MCG IN CAPS: 18.0000 ug | ORAL_CAPSULE | Freq: Every day | RESPIRATORY_TRACT | 11 refills | Status: DC

## 2021-03-18 NOTE — Telephone Encounter (Signed)
Called pt no answer. Left detailed message per DPR. Advised pt to call back for questions or concerns.  

## 2021-03-18 NOTE — Progress Notes (Signed)
Subjective:    Patient ID: Leslie Sims, male    DOB: 12/26/1950, 70 y.o.   MRN: [id number]  Requesting MD/Service: [name], MD Date of initial consultation: 08/10/16 by Dr. [name] Reason for consultation: COPD, smoker   PT PROFILE: 13 M former smoker quit 02/10/2019 with severe emphysema seen by multiple pulmonologists in past ([place], [place], [place], [place]) referred for further eval and mgmt of COPD.  Previously seen by Dr. [name] I have assumed care after Dr. Lora Havens departure.   DATA: 07/24/12 Office Spirometry: Severe obstruction (FEV1 31% pred) 08/31/13 Office Spirometry: very severe obstruction (FEV1 25% pred) 06/18/15 CT chest: Severe emphysema 03/03/17 Overnight oximetry: Lowest SpO2 72%. 95% of time SpO2 was below 90% 03/03/17 6MWT: 384 meters. Desat to 84% 10/04/17 CT chest: Two new irregularly marginated nodules, one in the RUL, and a second in the mid LUL worrisome for either synchronous lung ca or metastatic involvement of the lungs. The two small nodules described on the prior dictated report have resolved in the left superior segment and most likely were postinflammatory. Diffuse centrilobular emphysema. 10/13/17 PET scan: The new left upper lobe nodule is FDG avid consistent with malignancy. Recommend tissue confirmation. The new nodule in the right upper lobe demonstrates low level uptake. While the level of uptake suggests the possibility of an inflammatory or infectious process, a low-grade malignancy is not excluded. Given the suspected malignancy on the left, tissue confirmation should be considered on the right despite the low level of uptake. No distant metastatic disease identified 10/27/17 ENB ([place]): Transbronchial Fine Needle Aspirations 21G X7, Transbronchial Forceps Biopsy X6. ALVEOLATED LUNG TISSUE WITH HEMOSIDERIN LADEN MACROPHAGES.  NEGATIVE FOR ATYPIA AND MALIGNANCY 11/01/17 PFTs: FVC: 2.96 > 3.14 L (68 > 72 %pred), FEV1: 0.94 > 0.93 L (27  %pred), FEV1/FVC: 32%, TLC: 9.04 L (137 %pred), DLCO 62 %pred  12/07/17 CTA chest: No pulmonary embolus identified. Anterior left upper lobe ground-glass and nodular consolidation likely representing pneumonitis. Stable bilateral upper lobe pulmonary nodules. Stable emphysema. 01/21/18 CT chest: new masslike architectural distortion and ground-glass attenuation surrounding the index nodule within the RUL. The appearance favors an inflammatory or infectious process. The index nodule in the left upper lobe is slightly decreased in volume when compared with the previous exam favoring a benign process. Attention on follow-up imaging advise. Advanced changes of emphysema (very severe) 06/01/18 CTA chest: No evidence of pulmonary embolism. Waxing and waning bilateral pulmonary nodular process as described. New 8 mm nodular ill-defined focus of opacification over the lingula which may represent an acute inflammatory or infectious process. No effusion. Emphysema  10/19/18 CTA chest: No acute cardiopulmonary disease and no evidence of pulm embolism. Hospitalization 1/12-1/14/2020: COPD exacerbation Hospitalization 2/22-2/27/20: COPD exacerbation, HCAP 02/11/19 CT chest: Progressed masslike consolidation within the RUL in the region of previous waxing and waning nodule, this is contiguous with the pleural surface and demonstrates spiculated margin and probable small focus of central cavitation. Findings could be secondary to infection superimposed on chronic nodule. However cavitary neoplasm is also a concern. Multiple additional lung nodules which have not significantly changed. Small foci of ground-glass density in the RUL may reflect small focus of infection. Mod-severe emphysema 05/01/19 CT chest: Waxing/waning right lung opacities, suggesting infection, including atypical/mycobacterial infection. Improvement of the prior dominant masslike opacity argues against neoplasm. Stable left upper lobe nodule, likely  benign. Aortic Atherosclerosis and Emphysema 11/02/2019 CT chest: Multiple larger pulmonary nodules and masslike areas seen on the prior study have largely regressed.  Findings indicative  of infectious/inflammatory process.  Diffuse bronchial wall thickening with moderate centrilobular and paraseptal emphysema.  Suggestion of left main and three-vessel coronary artery disease. 04/23/2020 CT chest: "Multiple bilateral stable pulmonary nodules. New 7 mm nodule over the posterior left upper lobe. Stable nodularity over an area ofright upper lobe/apical scarring with new 9 mm nodular focus along the most inferior aspect of this scarring. This area has shown to wax and wane on previous exams and is most likely inflammatory". 07/24/2020 CT chest: "Background of marked pulmonary emphysema with stable post infectious or inflammatory changes and with near complete resolution of a presumably infectious or inflammatory nodule in the LEFT upper lobe as compared to previous exam". 01/21/2021 CT chest: Severe emphysematous changes, pulmonary scarring, bilateral calcified granulomas. 01/29/2021 PFT: FEV1 0.74 L or 22% of predicted, FVC 1.91 L or 42% predicted.  FEV1/FVC 39%.  No bronchodilator response.  Patient could not perform lung volumes.  Diffusion capacity is severely reduced.  This is consistent with very severe COPD. 02/13/2021 2D echo: LVEF 40 to 45%, global hypokinesis, severe hypokinesis of the periapical region.  DD grade II.  Normal right ventricular systolic function.  Left atrial size dilated.   INTERVAL: Last seen by  me on   01/14/2021 Last visit to ED 07/28/2020, due to chest pain. No recent COPD exacerbations.  Remains abstinent of tobacco.  Has been undergoing treatment for prostate cancer adenocarcinoma.  HPI [name] is a 70 year old former smoker who follows here for the issue of severe end-stage COPD.  He also has chronic respiratory failure with hypoxia.  Since his prior visit PFTs have shown  that his lung function has continued to decline.  Most recent 2D echo also shows ongoing issues with systolic and diastolic heart failure.  He had been given a trial of Breztri however he did not tolerate this and felt that he actually wheeze more on this medication.  He has resume Advair and Spiriva as previous.  Like to continue the same.  He also requests refills on his 3 times a week azithromycin previously instituted by Dr. [name].  Other than this he has no new complaint today.  Dyspnea is baseline.  Cough is baseline with no change in sputum quality or character.  Most recent CT scan shows no concerning nodules.  He has multiple granulomas.  He is not a candidate for interventional procedures, surgery or even radiation therapy due to his very severe end-stage COPD.   Review of Systems A 10 point review of systems was performed and it is as noted above otherwise negative.  Patient Active Problem List   Diagnosis Date Noted   Elevated PSA 07/28/2020   Iron deficiency anemia 05/20/2020   Goals of care, counseling/discussion    Palliative care by specialist    Healthcare-associated pneumonia 01/28/2019   COPD with acute exacerbation ([place]) 12/31/2018   Generalized anxiety disorder 11/12/2018   Moderate episode of recurrent major depressive disorder ([place]) 11/12/2018   Acute respiratory failure with hypoxia ([place]) 09/29/2018   Acute respiratory failure with hypoxia and hypercapnia ([place]) 09/13/2018   Acute on chronic respiratory failure with hypoxia and hypercapnia ([place]) 08/24/2018   Chest pain 07/02/2018   Grief reaction 01/13/2018   COPD with hypoxia ([place]) 12/25/2017   Unstable angina (HCC)    Benign neoplasm of ascending colon    Benign neoplasm of descending colon    Benign neoplasm of sigmoid colon    History of colonic polyps    Pulmonary nodules/lesions, multiple 01/05/2015  Bruit of left carotid artery 11/04/2014   Sleep disorder 07/18/2013   Seasonal and perennial allergic  rhinitis 05/29/2013   Bradycardia 04/20/2011   SMOKER 07/30/2010   CAD, NATIVE VESSEL 04/23/2010   Hyperlipidemia 03/24/2010   DEPRESSION/ANXIETY 87/68/1157   Chronic systolic heart failure ([place]) 03/24/2010   COPD, very severe ([place]) 03/24/2010   GERD 03/24/2010   Essential hypertension 11/21/2009   Social History   Tobacco Use   Smoking status: Former    Packs/day: 3.00    Years: 42.00    Pack years: 126.00    Types: Cigarettes    Quit date: 02/10/2019    Years since quitting: 2.3   Smokeless tobacco: Never   Tobacco comments:    25 months ago most smoked 4 packs a day .  Substance Use Topics   Alcohol use: No    Alcohol/week: 0.0 standard drinks   Allergies  Allergen Reactions   Prozac [Fluoxetine Hcl] Shortness Of Breath   Effexor Xr [Venlafaxine Hcl Er] Other (See Comments)    "Makes me feel funny"   Wellbutrin [Bupropion] Other (See Comments)    "Makes me feel funny"   Current Meds  Medication Sig   albuterol (PROVENTIL HFA;VENTOLIN HFA) 108 (90 Base) MCG/ACT inhaler Inhale 2 puffs into the lungs every 6 (six) hours as needed for wheezing or shortness of breath.   aspirin 81 MG tablet Take 81 mg by mouth daily.   atorvastatin (LIPITOR) 40 MG tablet Take 1 tablet (40 mg total) by mouth daily.   azithromycin (ZITHROMAX) 250 MG tablet Take 250 mg by mouth 3 (three) times a week.   escitalopram (LEXAPRO) 10 MG tablet Take 10 mg by mouth every morning.   Fluticasone-Salmeterol (ADVAIR) 500-50 MCG/DOSE AEPB Inhale 1 puff into the lungs 2 (two) times daily.   furosemide (LASIX) 40 MG tablet Take 1 tablet (40 mg total) by mouth daily.   ipratropium-albuterol (DUONEB) 0.5-2.5 (3) MG/3ML SOLN USE 1 VIAL VIA NEBULIZER AND INHALE BY MOUTH 4 TIMES A DAY   LORazepam (ATIVAN) 1 MG tablet Take 1 tablet (1 mg total) by mouth every 4 (four) hours while awake.   losartan (COZAAR) 25 MG tablet Take 25 mg by mouth daily.   mirtazapine (REMERON) 15 MG tablet Take 15 mg by mouth at  bedtime.   Multiple Vitamin (MULTIVITAMIN) capsule Take 1 capsule by mouth daily.   nitroGLYCERIN (NITROSTAT) 0.4 MG SL tablet Place 1 tablet (0.4 mg total) under the tongue every 5 (five) minutes as needed.   OLANZapine (ZYPREXA) 2.5 MG tablet Take 2.5 mg by mouth at bedtime.   OXYGEN Inhale 3 L into the lungs daily.    OXYGEN Inhale 2.5 L into the lungs daily.   sucralfate (CARAFATE) 1 g tablet Take 1 g by mouth 4 (four) times daily.   tiotropium (SPIRIVA) 18 MCG inhalation capsule Place 18 mcg into inhaler and inhale daily.   [DISCONTINUED] Budeson-Glycopyrrol-Formoterol (BREZTRI AEROSPHERE) 160-9-4.8 MCG/ACT AERO Inhale 2 puffs into the lungs 2 (two) times daily.   [DISCONTINUED] omeprazole (PRILOSEC) 40 MG capsule Take 1 capsule (40 mg total) by mouth daily.   Immunization History  Administered Date(s) Administered   Influenza Whole 09/19/2012   Influenza, High Dose Seasonal PF 09/28/2019   Influenza,inj,Quad PF,6+ Mos 10/24/2013, 11/25/2014, 03/02/2016, 10/27/2016, 10/05/2017   Influenza-Unspecified 10/24/2013, 11/25/2014, 03/02/2016, 10/27/2016, 10/05/2017, 09/18/2018   Moderna Sars-Covid-2 Vaccination 01/26/2020, 02/23/2020   PFIZER(Purple Top)SARS-COV-2 Vaccination 01/26/2020, 02/23/2020, 12/15/2020   Pneumococcal Conjugate-13 11/25/2014   Pneumococcal Polysaccharide-23 08/24/2016, 10/27/2016  Pneumococcal-Unspecified 12/20/2008   Tdap 12/20/2008       Objective:   Physical Exam BP 118/78 (BP Location: Left Arm, Patient Position: Sitting, Cuff Size: Normal)   Pulse 74   Temp 97.7 F (36.5 C) (Temporal)   Ht 5\' 10"  (1.778 m)   Wt 191 lb 12.8 oz (87 kg)   SpO2 97%   BMI 27.52 kg/m   GENERAL: Well-developed well-nourished gentleman, chronic use of accessories, no conversational dyspnea. No acute distress.    Fully ambulatory. Using oxygen portable concentrator at 3 L pulsed. HEAD: Normocephalic, atraumatic.  EYES: Pupils equal, round, reactive to light.  No scleral  icterus.  MOUTH: Nose/mouth/throat not examined due to masking requirements for COVID 19. NECK: Supple. No thyromegaly. No nodules. No JVD.  Trachea midline, no crepitus. PULMONARY: Very distant breath sounds, no adventitious sounds. CARDIOVASCULAR: S1 and S2. Regular rate and rhythm.  No rubs murmurs or gallops heard. GASTROINTESTINAL: Slightly protuberant abdomen, soft, benign. MUSCULOSKELETAL: No joint deformity, no clubbing, no edema.  NEUROLOGIC: Awake, alert, oriented.  No overt focal deficits.  Speech is fluent. SKIN: Intact,warm,dry.  Limited exam: No rashes. PSYCH: Normal mood and behavior.      Assessment & Plan:     ICD-10-CM   1. Stage 4 very severe COPD by GOLD classification ([place])  J44.9    Lung function has declined Did not tolerate Breztri Resume Advair/Spiriva    2. Pulmonary nodules/lesions, multiple  R91.8    These are likely granulomas Patient not a candidate for further work-up    3. Gastroesophageal reflux disease, unspecified whether esophagitis present  K21.9    On Nexium notes better control    4. Chronic hypoxemic respiratory failure (HCC)  J96.11    Continue supplemental oxygen    5. Former smoker  Z87.891    No evidence of relapse     Meds ordered this encounter  Medications   esomeprazole (NEXIUM) 40 MG capsule    Sig: Take 1 capsule (40 mg total) by mouth daily.    Dispense:  30 capsule    Refill:  4   DISCONTD: Fluticasone-Salmeterol (ADVAIR) 500-50 MCG/DOSE AEPB    Sig: Inhale 1 puff into the lungs 2 (two) times daily.    Dispense:  60 each    Refill:  11    May use generic substitution   tiotropium (SPIRIVA) 18 MCG inhalation capsule    Sig: Place 1 capsule (18 mcg total) into inhaler and inhale daily.    Dispense:  30 capsule    Refill:  11   DISCONTD: azithromycin (ZITHROMAX) 250 MG tablet    Sig: Take 1 tablet (250 mg total) by mouth 3 (three) times a week.    Dispense:  12 each    Refill:  6   We will see the patient in  follow-up in 3 months time he is to contact us prior to that time should any new difficulties arise.  Renold Don, MD [facility]   *This note was dictated using voice recognition software/Dragon.  Despite best efforts to proofread, errors can occur which can change the meaning.  Any change was purely unintentional.

## 2021-03-18 NOTE — Telephone Encounter (Signed)
-----   Message from Billey Co, MD sent at 03/18/2021  8:18 AM EDT ----- Good news, PSA dropping after radiation treatment for prostate cancer.  Keep follow-up as scheduled  Nickolas Madrid, MD 03/18/2021

## 2021-03-18 NOTE — Patient Instructions (Signed)
We have refilled your medications this includes Advair, Spiriva, Azithromycin and we also filled and a new prescription for Nexium (purple pill).  We will see you in follow-up in 3 months time call sooner should any new problems arise.

## 2021-03-30 ENCOUNTER — Emergency Department
Admission: EM | Admit: 2021-03-30 | Discharge: 2021-03-30 | Disposition: A | Discharge status: Home / Self Care | Payer: Medicare Other | Attending: Emergency Medicine | Admitting: Emergency Medicine

## 2021-03-30 ENCOUNTER — Other Ambulatory Visit: Payer: Self-pay

## 2021-03-30 ENCOUNTER — Emergency Department: Payer: Medicare Other

## 2021-03-30 DIAGNOSIS — Z7982 Long term (current) use of aspirin: Secondary | ICD-10-CM | POA: Insufficient documentation

## 2021-03-30 DIAGNOSIS — Z8546 Personal history of malignant neoplasm of prostate: Secondary | ICD-10-CM | POA: Insufficient documentation

## 2021-03-30 DIAGNOSIS — R079 Chest pain, unspecified: Secondary | ICD-10-CM | POA: Diagnosis present

## 2021-03-30 DIAGNOSIS — I10 Essential (primary) hypertension: Secondary | ICD-10-CM | POA: Diagnosis not present

## 2021-03-30 DIAGNOSIS — Z87891 Personal history of nicotine dependence: Secondary | ICD-10-CM | POA: Diagnosis not present

## 2021-03-30 DIAGNOSIS — J441 Chronic obstructive pulmonary disease with (acute) exacerbation: Secondary | ICD-10-CM | POA: Insufficient documentation

## 2021-03-30 DIAGNOSIS — I2510 Atherosclerotic heart disease of native coronary artery without angina pectoris: Secondary | ICD-10-CM | POA: Diagnosis not present

## 2021-03-30 DIAGNOSIS — R0789 Other chest pain: Secondary | ICD-10-CM | POA: Diagnosis not present

## 2021-03-30 DIAGNOSIS — Z20822 Contact with and (suspected) exposure to covid-19: Secondary | ICD-10-CM | POA: Insufficient documentation

## 2021-03-30 DIAGNOSIS — Z79899 Other long term (current) drug therapy: Secondary | ICD-10-CM | POA: Insufficient documentation

## 2021-03-30 LAB — BASIC METABOLIC PANEL
Anion gap: 7 (ref 5–15)
BUN: 15 mg/dL (ref 8–23)
CO2: 33 mmol/L — ABNORMAL HIGH (ref 22–32)
Calcium: 8.6 mg/dL — ABNORMAL LOW (ref 8.9–10.3)
Chloride: 100 mmol/L (ref 98–111)
Creatinine, Ser: 0.83 mg/dL (ref 0.61–1.24)
GFR, Estimated: >60 mL/min (ref >60)
Glucose, Bld: 76 mg/dL (ref 70–99)
Potassium: 3.1 mmol/L — ABNORMAL LOW (ref 3.5–5.1)
Sodium: 140 mmol/L (ref 135–145)

## 2021-03-30 LAB — CBC
HCT: 37.4 % — ABNORMAL LOW (ref 39.0–52.0)
Hemoglobin: 12.2 g/dL — ABNORMAL LOW (ref 13.0–17.0)
MCH: 30 pg (ref 26.0–34.0)
MCHC: 32.6 g/dL (ref 30.0–36.0)
MCV: 91.9 fL (ref 80.0–100.0)
Platelets: 215 10*3/uL (ref 150–400)
RBC: 4.07 MIL/uL — ABNORMAL LOW (ref 4.22–5.81)
RDW: 12.5 % (ref 11.5–15.5)
WBC: 6.5 10*3/uL (ref 4.0–10.5)
nRBC: 0 % (ref 0.0–0.2)

## 2021-03-30 LAB — RESP PANEL BY RT-PCR (FLU A&B, COVID) ARPGX2
Influenza A by PCR: NEGATIVE
Influenza B by PCR: NEGATIVE
SARS Coronavirus 2 by RT PCR: NEGATIVE

## 2021-03-30 LAB — TROPONIN I (HIGH SENSITIVITY)
Troponin I (High Sensitivity): 8 ng/L (ref <18)
Troponin I (High Sensitivity): 9 ng/L (ref <18)

## 2021-03-30 LAB — BRAIN NATRIURETIC PEPTIDE: B Natriuretic Peptide: 45.3 pg/mL (ref 0.0–100.0)

## 2021-03-30 IMAGING — CR DG CHEST 2V
1 series · 2 of 2 positions shown · non-contrast
Comparison: [DATE]

CLINICAL DATA: Chest pain

EXAM:
CHEST - 2 VIEW

[Series 1: w chest pa · 0.14mm/px · 2 of 2 slices shown]
[im 1/2]
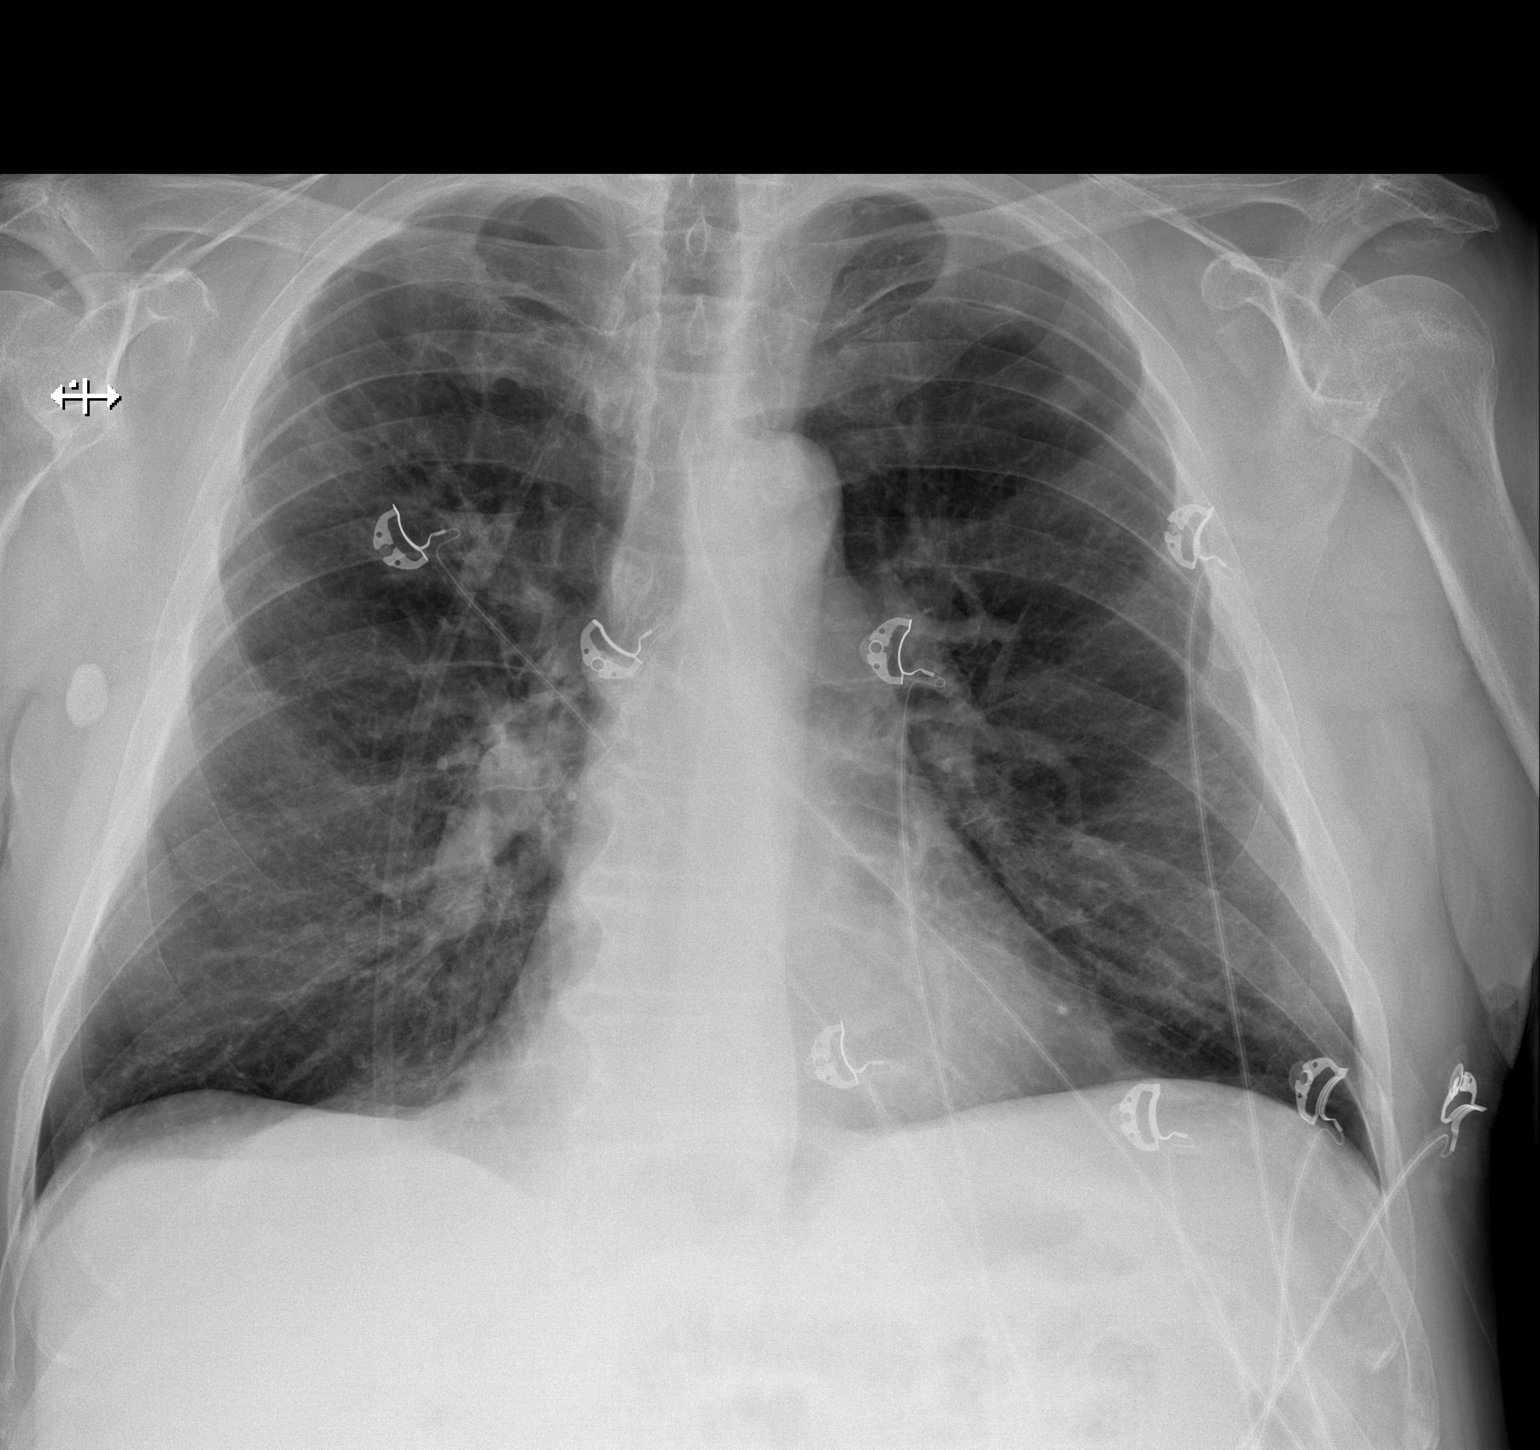
[im 2/2]
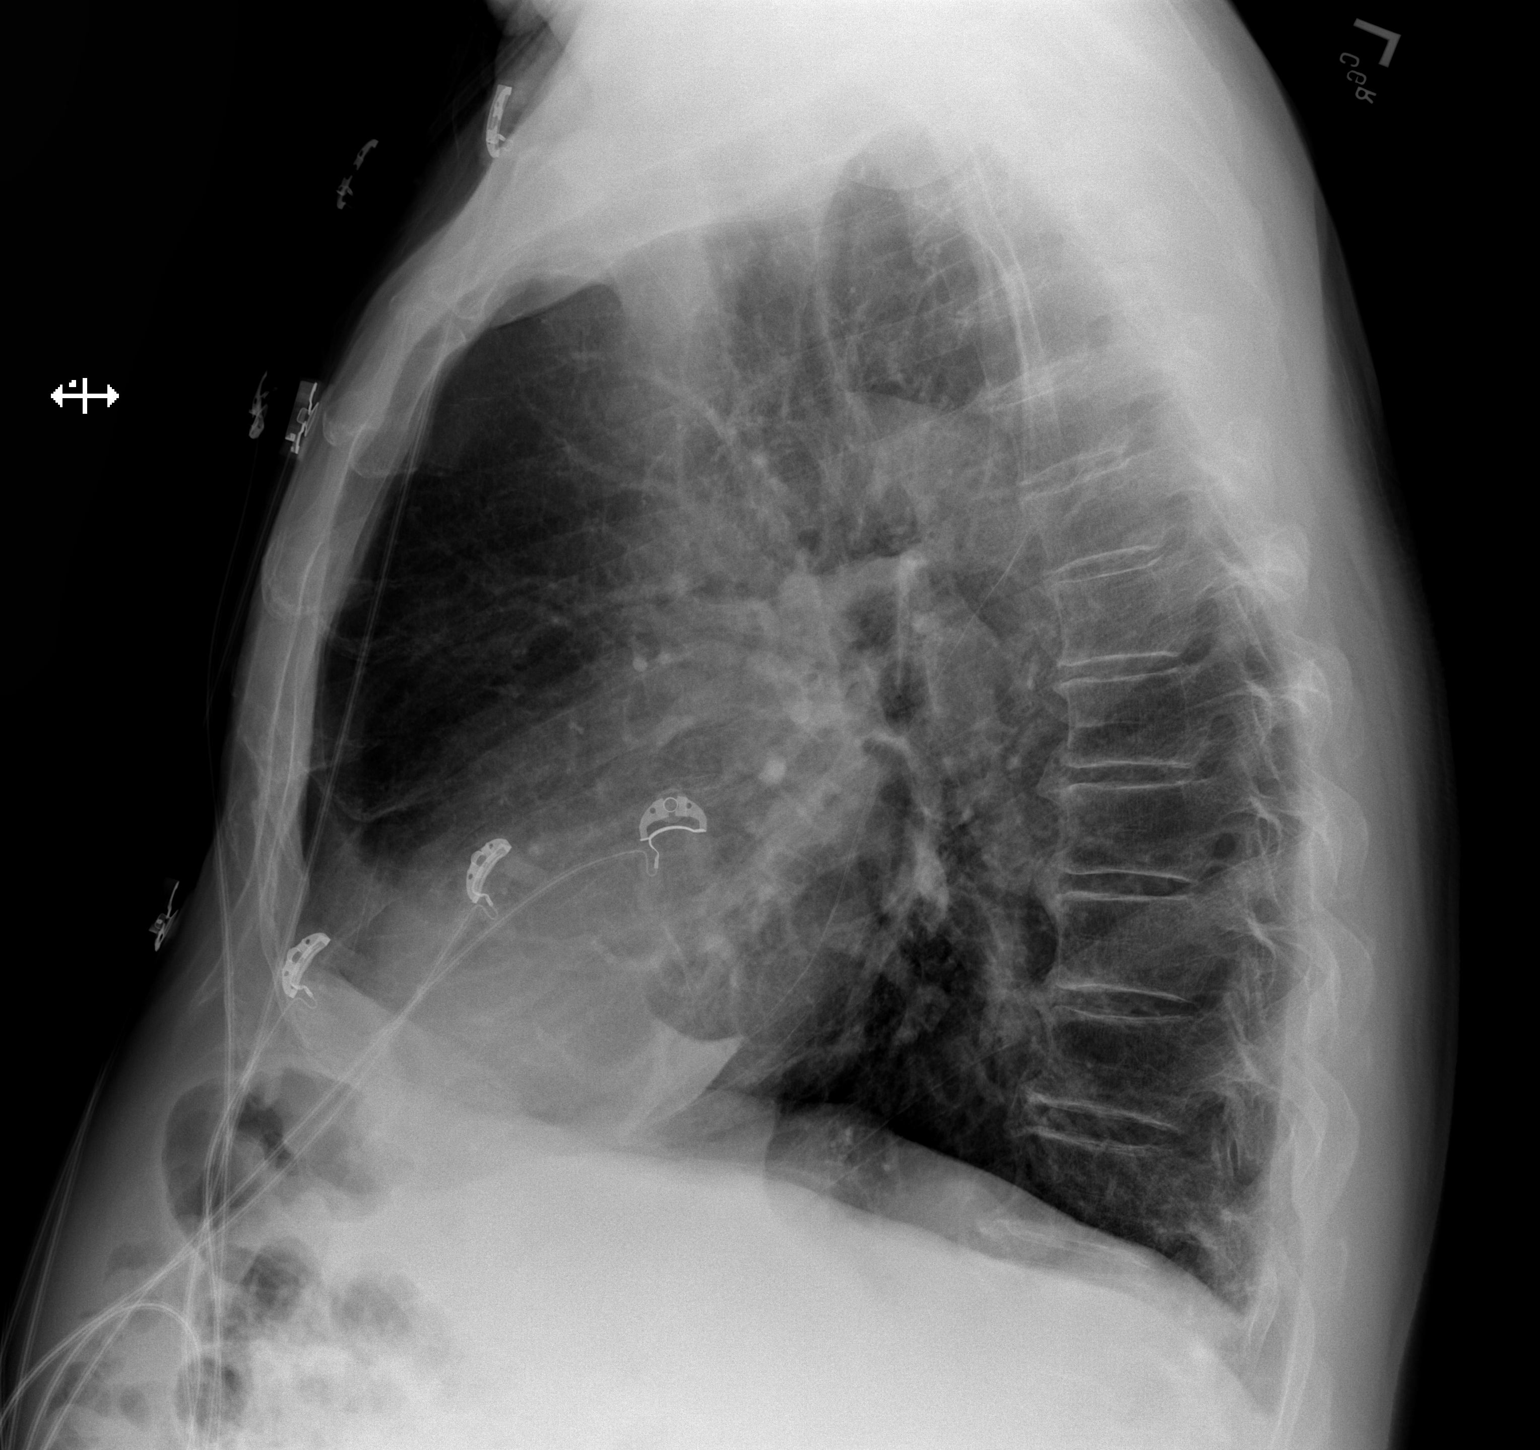

[2 of 2 positions shown; findings below may reference images not displayed]

FINDINGS: Cardiomediastinal silhouette and pulmonary vasculature are within
normal limits.

Calcified nodule again noted in the left upper lobe. No focal
airspace opacity in pneumonia. Lungs are hyperexpanded consistent
with emphysema.
IMPRESSION: No acute cardiopulmonary process.

## 2021-03-30 MED ORDER — METHYLPREDNISOLONE SODIUM SUCC 125 MG IJ SOLR
125.0000 mg | Freq: Once | INTRAMUSCULAR | Status: AC
  Administered 2021-03-30: 125 mg via INTRAVENOUS
  Filled 2021-03-30: qty 2

## 2021-03-30 MED ORDER — PREDNISONE 20 MG PO TABS: 60.0000 mg | ORAL_TABLET | Freq: Every day | ORAL | 0 refills | Status: AC

## 2021-03-30 MED ORDER — IPRATROPIUM-ALBUTEROL 0.5-2.5 (3) MG/3ML IN SOLN
3.0000 mL | Freq: Once | RESPIRATORY_TRACT | Status: AC
Start: 2021-03-30 — End: 2021-03-30
  Administered 2021-03-30: 3 mL via RESPIRATORY_TRACT
  Filled 2021-03-30: qty 3

## 2021-03-30 MED ORDER — IPRATROPIUM-ALBUTEROL 0.5-2.5 (3) MG/3ML IN SOLN
3.0000 mL | Freq: Once | RESPIRATORY_TRACT | Status: AC
  Administered 2021-03-30: 3 mL via RESPIRATORY_TRACT
  Filled 2021-03-30: qty 3

## 2021-03-30 NOTE — ED Provider Notes (Signed)
[facility] Emergency Department Provider Note ____________________________________________   Event Date/Time   First MD Initiated Contact with Patient 03/30/21 1402     (approximate)  I have reviewed the triage vital signs and the nursing notes.   HISTORY  Chief Complaint Chest Pain    HPI Ayman Brull is a 70 y.o. male with PMH as noted below including COPD on home O2, CAD, CHF, and GERD who presents with chest pain over the last week.  He states that the pain is in the left side of his chest and is burning in quality.  It has been intermittent over the last week and is not exertional.  He reports increased worsening shortness of breath but denies cough or fever.  He has no leg swelling.  Past Medical History:  Diagnosis Date  . Allergic rhinitis   . Anxiety   . Back pain   . Cancer [facility])    prostate  . Colon polyps 08/2012   colonoscopy; multiple colon polyps; repeat colonoscopy in one year.  [name] COPD (chronic obstructive pulmonary disease) ([place])    a. on home O2.  . Coronary artery disease 11/2009   a. late presenting ant MI; b. LHC 100% pLAD s/p PCI/DES, 99% mRCA s/p PCI/DES, EF 35%; c. nuclear stress test 05/13: prior ant/inf infarcts w/o ischemia, EF 43%; d. LHC 02/14: widely patent stents with no other obs dz, EF 40%; e. LHC 11/17: LM nl, mLAD 10%, patent LAD stent, dLAD 20%, p-mRCA 10%, mRCA 40%, patent RCA stent, EF 35-45%   . Depression   . GERD (gastroesophageal reflux disease)   . Headache(784.0)   . Helicobacter pylori (H. pylori)   . Hemorrhoids   . Hernia    inguinal  . HFrEF (heart failure with reduced ejection fraction) ([place])    a. 10/2016 LV gram: EF 35-45%; b. 06/2018 Echo: EF 40-45%. c. 01/2021 Echo: EF 40-45%  . Hyperlipidemia   . Hypertension   . Insomnia   . Ischemic cardiomyopathy    a. 10/2016 LV gram: EF 35-45%; b. 06/2018 Echo: EF 40-45%.  . Myalgia   . Peptic ulcer   . Pulmonary nodules   . ST elevation (STEMI)  myocardial infarction involving left anterior descending coronary artery ([place]) 11/2009   a. s/p PCI to the LAD  . Status post dilation of esophageal narrowing 2000  . Tinea pedis   . Wears dentures    partial upper    Patient Active Problem List   Diagnosis Date Noted  . Elevated PSA 07/28/2020  . Iron deficiency anemia 05/20/2020  . Goals of care, counseling/discussion   . Palliative care by specialist   . Healthcare-associated pneumonia 01/28/2019  . COPD with acute exacerbation ([place]) 12/31/2018  . Generalized anxiety disorder 11/12/2018  . Moderate episode of recurrent major depressive disorder ([place]) 11/12/2018  . Acute respiratory failure with hypoxia ([place]) 09/29/2018  . Acute respiratory failure with hypoxia and hypercapnia ([place]) 09/13/2018  . Acute on chronic respiratory failure with hypoxia and hypercapnia ([place]) 08/24/2018  . Chest pain 07/02/2018  . Grief reaction 01/13/2018  . COPD with hypoxia ([place]) 12/25/2017  . Unstable angina ([place])   . Benign neoplasm of ascending colon   . Benign neoplasm of descending colon   . Benign neoplasm of sigmoid colon   . History of colonic polyps   . Pulmonary nodules/lesions, multiple 01/05/2015  . Bruit of left carotid artery 11/04/2014  . Sleep disorder 07/18/2013  . Seasonal and perennial allergic rhinitis  05/29/2013  . Bradycardia 04/20/2011  . SMOKER 07/30/2010  . CAD, NATIVE VESSEL 04/23/2010  . Hyperlipidemia 03/24/2010  . DEPRESSION/ANXIETY 03/24/2010  . Chronic systolic heart failure ([place]) 03/24/2010  . COPD, very severe ([place]) 03/24/2010  . GERD 03/24/2010  . Essential hypertension 11/21/2009    Past Surgical History:  Procedure Laterality Date  . Admission  12/20/2012   COPD exacerbation.  [place].  [name] CARDIAC CATHETERIZATION    . CARDIAC CATHETERIZATION  02/05/13   [facility]  . CARDIAC CATHETERIZATION  10/14   [facility] : patent stents with no change in anatomy. EF: 40$  . CARDIAC CATHETERIZATION Left 11/12/2016   Procedure:  Left Heart Cath and Coronary Angiography;  Surgeon: Wellington Hampshire, MD;  Location: [place] [facility];  Service: Cardiovascular;  Laterality: Left;  . COLONOSCOPY    . COLONOSCOPY WITH PROPOFOL N/A 05/18/2016   Procedure: COLONOSCOPY WITH PROPOFOL;  Surgeon: Lucilla Lame, MD;  Location: [facility] ENDOSCOPY;  Service: Endoscopy;  Laterality: N/A;  . COLONOSCOPY WITH PROPOFOL N/A 03/19/2020   Procedure: COLONOSCOPY WITH PROPOFOL;  Surgeon: Lin Landsman, MD;  Location: [facility] ENDOSCOPY;  Service: Gastroenterology;  Laterality: N/A;  . CORONARY ANGIOPLASTY WITH STENT PLACEMENT  09/2009   LAD 3.0 X23 mm Xience DES, RCA: 4.0 X 15 mm Xience DES  . ELECTROMAGNETIC NAVIGATION BROCHOSCOPY N/A 10/27/2017   Procedure: ELECTROMAGNETIC NAVIGATION BRONCHOSCOPY;  Surgeon: Flora Lipps, MD;  Location: [facility] ORS;  Service: Cardiopulmonary;  Laterality: N/A;  . ESOPHAGEAL DILATION    . ESOPHAGOGASTRODUODENOSCOPY  2008  . ESOPHAGOGASTRODUODENOSCOPY  08/20/2012  . HERNIA REPAIR  07/20/2012   L inguinal hernia repair  . PENILE PROSTHESIS IMPLANT      Prior to Admission medications   Medication Sig Start Date End Date Taking? Authorizing Provider  predniSONE (DELTASONE) 20 MG tablet Take 3 tablets (60 mg total) by mouth daily with breakfast for 5 days. 03/30/21 04/04/21 Yes Arta Silence, MD  albuterol (PROVENTIL HFA;VENTOLIN HFA) 108 (90 Base) MCG/ACT inhaler Inhale 2 puffs into the lungs every 6 (six) hours as needed for wheezing or shortness of breath. 11/01/18   Rudene Re, MD  aspirin 81 MG tablet Take 81 mg by mouth daily.    [provider]  atorvastatin (LIPITOR) 80 MG tablet Take 1 tablet (80 mg total) by mouth daily. 03/18/21 06/16/21  Rise Mu, PA-C  azithromycin (ZITHROMAX) 250 MG tablet Take 1 tablet (250 mg total) by mouth 3 (three) times a week. 03/18/21   [name], MD  escitalopram (LEXAPRO) 10 MG tablet Take 10 mg by mouth every morning. 01/30/21   [provider]  esomeprazole (NEXIUM) 40 MG capsule Take 1 capsule (40 mg total) by mouth daily. 03/18/21 08/15/21  [name], MD  Fluticasone-Salmeterol (ADVAIR) 500-50 MCG/DOSE AEPB Inhale 1 puff into the lungs 2 (two) times daily. 03/18/21   [name], MD  furosemide (LASIX) 40 MG tablet Take 1 tablet (40 mg total) by mouth daily. 02/06/21 02/01/22  Theora Gianotti, NP  ipratropium-albuterol (DUONEB) 0.5-2.5 (3) MG/3ML SOLN USE 1 VIAL VIA NEBULIZER AND INHALE BY MOUTH 4 TIMES A DAY 02/20/21   [name], MD  LORazepam (ATIVAN) 1 MG tablet Take 1 tablet (1 mg total) by mouth every 4 (four) hours while awake. 02/14/19   [name], MD  losartan (COZAAR) 25 MG tablet Take 25 mg by mouth daily. 02/23/19   [provider]  mirtazapine (REMERON) 15 MG tablet Take 15 mg by mouth at bedtime. 06/02/20  [provider]  Multiple Vitamin (MULTIVITAMIN) capsule Take 1 capsule by mouth daily.    [provider]  nitroGLYCERIN (NITROSTAT) 0.4 MG SL tablet Place 1 tablet (0.4 mg total) under the tongue every 5 (five) minutes as needed. 06/04/20   Loel Dubonnet, NP  OLANZapine (ZYPREXA) 2.5 MG tablet Take 2.5 mg by mouth at bedtime.    [provider]  OXYGEN Inhale 3 L into the lungs daily.     [provider]  OXYGEN Inhale 2.5 L into the lungs daily.    [provider]  sucralfate (CARAFATE) 1 g tablet Take 1 g by mouth 4 (four) times daily. 06/03/20   [provider]  tiotropium (SPIRIVA) 18 MCG inhalation capsule Place 1 capsule (18 mcg total) into inhaler and inhale daily. 03/18/21   [name], MD    Allergies Prozac [fluoxetine hcl], Effexor xr [venlafaxine hcl er], and Wellbutrin [bupropion]  Family History  Problem Relation Age of Onset  . Heart attack Brother        Brother #1  . Diabetes Brother   . Hypertension Brother        #3  . Coronary artery disease Father 26       deceased  . Heart attack  Father   . Diabetes Father   . Heart disease Father   . COPD Mother 41       deceased  . Alcohol abuse Sister        polysubstance abuse  . COPD Sister   . Lung cancer Sister   . Alcohol abuse Sister        polysubstance abuse  . Penile cancer Brother   . Diabetes Brother   . Prostate cancer Neg Hx   . Bladder Cancer Neg Hx   . Kidney cancer Neg Hx     Social History Social History   Tobacco Use  . Smoking status: Former Smoker    Packs/day: 3.00    Years: 42.00    Pack years: 126.00    Types: Cigarettes    Quit date: 02/10/2019    Years since quitting: 2.1  . Smokeless tobacco: Never Used  . Tobacco comment: 25 months ago most smoked 4 packs a day .  Vaping Use  . Vaping Use: Never used  Substance Use Topics  . Alcohol use: No    Alcohol/week: 0.0 standard drinks  . Drug use: No    Review of Systems  Constitutional: No fever/chills Eyes: No visual changes. ENT: No sore throat. Cardiovascular: Positive for chest pain. Respiratory: Positive for shortness of breath. Gastrointestinal: No nausea, no vomiting.  No diarrhea.  Genitourinary: Negative for dysuria.  Musculoskeletal: Negative for back pain. Skin: Negative for rash. Neurological: Negative for headaches, focal weakness or numbness.   ____________________________________________   PHYSICAL EXAM:  VITAL SIGNS: ED Triage Vitals  Enc Vitals Group     BP 03/30/21 1403 135/86     Pulse Rate 03/30/21 1406 77     Resp 03/30/21 1403 18     Temp 03/30/21 1403 98.2 F (36.8 C)     Temp Source 03/30/21 1403 Oral     SpO2 03/30/21 1403 100 %     Weight 03/30/21 1403 179 lb (81.2 kg)     Height 03/30/21 1403 5\' 10"  (1.778 m)     Head Circumference --      Peak Flow --      Pain Score 03/30/21 1403 2     Pain Loc --  Pain Edu? --      Excl. in [place]? --     Constitutional: Alert and oriented.  Slightly uncomfortable appearing but in no acute distress. Eyes: Conjunctivae are normal.  Head:  Atraumatic. Nose: No congestion/rhinnorhea. Mouth/Throat: Mucous membranes are moist.   Neck: Normal range of motion.  Cardiovascular: Normal rate, regular rhythm. Grossly normal heart sounds.  Good peripheral circulation. Respiratory: Increased respiratory effort.  No retractions.  Wheezing bilaterally Gastrointestinal: Soft and nontender. No distention.  Genitourinary: No flank tenderness. Musculoskeletal: No lower extremity edema.  Extremities warm and well perfused.  Neurologic:  Normal speech and language. No gross focal neurologic deficits are appreciated.  Skin:  Skin is warm and dry. No rash noted. Psychiatric: Mood and affect are normal. Speech and behavior are normal.  ____________________________________________   LABS (all labs ordered are listed, but only abnormal results are displayed)  Labs Reviewed  BASIC METABOLIC PANEL - Abnormal; Notable for the following components:      Result Value   Potassium 3.1 (*)    CO2 33 (*)    Calcium 8.6 (*)    All other components within normal limits  CBC - Abnormal; Notable for the following components:   RBC 4.07 (*)    Hemoglobin 12.2 (*)    HCT 37.4 (*)    All other components within normal limits  RESP PANEL BY RT-PCR (FLU A&B, COVID) ARPGX2  BRAIN NATRIURETIC PEPTIDE  TROPONIN I (HIGH SENSITIVITY)  TROPONIN I (HIGH SENSITIVITY)   ____________________________________________  EKG  ED ECG REPORT I, Arta Silence, the attending physician, personally viewed and interpreted this ECG.  Date: 03/30/2021 EKG Time: 1405 Rate: 77 Rhythm: normal sinus rhythm QRS Axis: normal Intervals: normal ST/T Wave abnormalities: Nonspecific T wave abnormalities Narrative Interpretation: no evidence of acute ischemia; no significant change when compared to EKG of 02/20/2021  ____________________________________________  RADIOLOGY  This x-ray interpreted by me shows no focal infiltrate or  edema  ____________________________________________   PROCEDURES  Procedure(s) performed: No  Procedures  Critical Care performed: No ____________________________________________   INITIAL IMPRESSION / ASSESSMENT AND PLAN / ED COURSE  Pertinent labs & imaging results that were available during my care of the patient were reviewed by me and considered in my medical decision making (see chart for details).  70 year old male with PMH as noted above including COPD, CAD, CHF, and GERD presents with somewhat atypical burning intermittent chest pain over the last week associated with increasing shortness of breath.  The patient used nebulizer treatments at home with no relief.  I reviewed the past medical records in [place].  The patient was most recently seen in the ED last month with increased shortness of breath and similar atypical chest pain.  He had edema at that time and was given Lasix with relief in his symptoms.  On exam today, the patient is overall relatively well-appearing.  His vital signs are normal.  O2 saturation is in the high 90s on his baseline 3 L of O2 by nasal cannula.  He has wheezing bilaterally but no peripheral edema.  The exam is otherwise unremarkable.  Overall presentation is most consistent with COPD exacerbation although CHF and ACS are on the differential as is COVID-19, other viral etiology, or pneumonia.  We will obtain a chest x-ray, lab work-up give duo nebs and steroid, and reassess.  ----------------------------------------- 4:35 PM on 03/30/2021 -----------------------------------------  The patient states he feels significantly better after the steroid and bronchodilators.  His lab work-up is reassuring.  Troponins are  negative x2.  Covid is negative.  The BNP is normal.  His vital signs remain stable.  At this time, the patient states he feels comfortable to go home.  He is stable for discharge.  I will prescribe a 5-day course of prednisone and have  instructed him to continue his nebulizer treatments.  He will follow up with his regular doctors.  Return precautions given, and he expresses understanding   ____________________________________________   FINAL CLINICAL IMPRESSION(S) / ED DIAGNOSES  Final diagnoses:  Atypical chest pain  COPD exacerbation (HCC)      NEW MEDICATIONS STARTED DURING THIS VISIT:  New Prescriptions   PREDNISONE (DELTASONE) 20 MG TABLET    Take 3 tablets (60 mg total) by mouth daily with breakfast for 5 days.     Note:  This document was prepared using Dragon voice recognition software and may include unintentional dictation errors.   Arta Silence, MD 03/30/21 [phone]

## 2021-03-30 NOTE — ED Triage Notes (Cosign Needed)
BIB [facility] from home c/o CP X1 week worsening today 6/10. NSR 80, 124/83, 2L [place] chronically 96%, CBG 109   20G L forearm, 500cc NS en route  324 ASA taken PTA. hx of COPD, MI, anxiety.

## 2021-03-30 NOTE — Discharge Instructions (Signed)
Take the prednisone starting tomorrow for 5 days.  Continue your nebulizer treatments every 4-6 hours for the next several days.  Return to the ER for new, worsening, or persistent severe shortness of breath, chest pain, weakness or lightheadedness, or any other new or worsening symptoms that concern you.

## 2021-04-08 ENCOUNTER — Inpatient Hospital Stay: Payer: Medicare Other

## 2021-04-10 ENCOUNTER — Encounter: Payer: Self-pay | Admitting: Cardiovascular Disease

## 2021-04-10 ENCOUNTER — Ambulatory Visit (INDEPENDENT_AMBULATORY_CARE_PROVIDER_SITE_OTHER): Payer: Medicare Other | Admitting: Cardiovascular Disease

## 2021-04-10 ENCOUNTER — Other Ambulatory Visit: Payer: Self-pay

## 2021-04-10 VITALS — BP 128/70 | HR 86 | Ht 70.0 in | Wt 194.4 lb

## 2021-04-10 DIAGNOSIS — E785 Hyperlipidemia, unspecified: Secondary | ICD-10-CM

## 2021-04-10 DIAGNOSIS — I255 Ischemic cardiomyopathy: Secondary | ICD-10-CM

## 2021-04-10 DIAGNOSIS — I5022 Chronic systolic (congestive) heart failure: Secondary | ICD-10-CM

## 2021-04-10 DIAGNOSIS — I2510 Atherosclerotic heart disease of native coronary artery without angina pectoris: Secondary | ICD-10-CM

## 2021-04-10 DIAGNOSIS — I493 Ventricular premature depolarization: Secondary | ICD-10-CM

## 2021-04-10 DIAGNOSIS — J449 Chronic obstructive pulmonary disease, unspecified: Secondary | ICD-10-CM

## 2021-04-10 NOTE — Patient Instructions (Signed)
Medication Instructions:  Your physician recommends that you continue on your current medications as directed. Please refer to the Current Medication list given to you today.  *If you need a refill on your cardiac medications before your next appointment, please call your pharmacy*   Lab Work: None ordered If you have labs (blood work) drawn today and your tests are completely normal, you will receive your results only by: . MyChart Message (if you have MyChart) OR . A paper copy in the mail If you have any lab test that is abnormal or we need to change your treatment, we will call you to review the results.   Testing/Procedures: None ordered   Follow-Up: At [facility], you and your health needs are our priority.  As part of our continuing mission to provide you with exceptional heart care, we have created designated Provider Care Teams.  These Care Teams include your primary Cardiologist (physician) and Advanced Practice Providers (APPs -  Physician Assistants and Nurse Practitioners) who all work together to provide you with the care you need, when you need it.  We recommend signing up for the patient portal called "MyChart".  Sign up information is provided on this After Visit Summary.  MyChart is used to connect with patients for Virtual Visits (Telemedicine).  Patients are able to view lab/test results, encounter notes, upcoming appointments, etc.  Non-urgent messages can be sent to your provider as well.   To learn more about what you can do with MyChart, go to [email or web address].    Your next appointment:   Your physician wants you to follow-up in: 6 months You will receive a reminder letter in the mail two months in advance. If you don't receive a letter, please call our office to schedule the follow-up appointment.   The format for your next appointment:   In Person  Provider:   You may see Muhammad Arida, MD or one of the following Advanced Practice Providers on  your designated Care Team:    Christopher Berge, NP  Ryan Dunn, PA-C  Jacquelyn Visser, PA-C  Cadence Furth, PA-C  Caitlin Walker, NP    Other Instructions N/A  

## 2021-04-10 NOTE — Progress Notes (Signed)
Cardiology Office Note   Date:  04/10/2021   ID:  Nazeer [place], [place] [birth year], MRN [id number]  PCP:  [name], MD  Cardiologist:   Kathlyn Sacramento, MD   Chief Complaint  Patient presents with  . Other    3 month f/u no complaints today. Meds reviewed verbally with pt.      History of Present Illness: Leslie Sims is a 70 y.o. male who presents for a follow-up visit regarding coronary artery disease and ischemic cardiomyopathy.  He has known history of severe COPD on home oxygen, prolonged history of previous tobacco use, anxiety/depression and hyperlipidemia. He is status post anterior myocardial infarction in 2010 with finding of an occluded LAD and a 99% stenosis in the RCA.  He was treated with drug-eluting stent placement to the LAD and RCA at that time.  He subsequently underwent repeat catheterization in November 2017 which showed patent LAD and RCA stents with an EF of 35 to 40%.  He had multiple hospitalizations in 2020  for COPD exacerbations but was finally able to quit smoking and had no hospitalizations since then.  He continues to use 2 L nasal cannula oxygen.    He was diagnosed last year with prostate cancer and was felt to be a poor surgical candidate and thus underwent radiation therapy.  He was noted to have ventricular bigeminy at an office visit with his primary care physician.  I started him on small dose Toprol 25 mg once daily but was subsequently seen in the ED for worsening dyspnea and lower extremity edema.  He was diuresed.  The dose of furosemide was increased to 40 mg daily.  He was switched from metoprolol to bisoprolol due to wheezing.  Bisoprolol was subsequently discontinued due to continued wheezing.   He went to the emergency room on April 11 with atypical chest pain and shortness of breath..  EKG showed no acute changes.  He felt significantly better with steroids and bronchodilators.  Troponin was negative x2, COVID was negative  and BNP was normal. He is feeling better overall with less shortness of breath.  No chest pain.  He continues to be on 2-1/2 L of oxygen.  He is not sure if he is taking 40 mg or 80 mg of furosemide daily.   Past Medical History:  Diagnosis Date  . Allergic rhinitis   . Anxiety   . Back pain   . Cancer [facility])    prostate  . Colon polyps 08/2012   colonoscopy; multiple colon polyps; repeat colonoscopy in one year.  [name] COPD (chronic obstructive pulmonary disease) ([place])    a. on home O2.  . Coronary artery disease 11/2009   a. late presenting ant MI; b. LHC 100% pLAD s/p PCI/DES, 99% mRCA s/p PCI/DES, EF 35%; c. nuclear stress test 05/13: prior ant/inf infarcts w/o ischemia, EF 43%; d. LHC 02/14: widely patent stents with no other obs dz, EF 40%; e. LHC 11/17: LM nl, mLAD 10%, patent LAD stent, dLAD 20%, p-mRCA 10%, mRCA 40%, patent RCA stent, EF 35-45%   . Depression   . GERD (gastroesophageal reflux disease)   . Headache(784.0)   . Helicobacter pylori (H. pylori)   . Hemorrhoids   . Hernia    inguinal  . HFrEF (heart failure with reduced ejection fraction) ([place])    a. 10/2016 LV gram: EF 35-45%; b. 06/2018 Echo: EF 40-45%. c. 01/2021 Echo: EF 40-45%  . Hyperlipidemia   . Hypertension   .  Insomnia   . Ischemic cardiomyopathy    a. 10/2016 LV gram: EF 35-45%; b. 06/2018 Echo: EF 40-45%.  . Myalgia   . Peptic ulcer   . Pulmonary nodules   . ST elevation (STEMI) myocardial infarction involving left anterior descending coronary artery ([place]) 11/2009   a. s/p PCI to the LAD  . Status post dilation of esophageal narrowing 2000  . Tinea pedis   . Wears dentures    partial upper    Past Surgical History:  Procedure Laterality Date  . Admission  12/20/2012   COPD exacerbation.  [place].  [name] CARDIAC CATHETERIZATION    . CARDIAC CATHETERIZATION  02/05/13   [facility]  . CARDIAC CATHETERIZATION  10/14   [facility] : patent stents with no change in anatomy. EF: 40$  . CARDIAC CATHETERIZATION Left  11/12/2016   Procedure: Left Heart Cath and Coronary Angiography;  Surgeon: Wellington Hampshire, MD;  Location: [place] CV LAB;  Service: Cardiovascular;  Laterality: Left;  . COLONOSCOPY    . COLONOSCOPY WITH PROPOFOL N/A 05/18/2016   Procedure: COLONOSCOPY WITH PROPOFOL;  Surgeon: Lucilla Lame, MD;  Location: [facility] ENDOSCOPY;  Service: Endoscopy;  Laterality: N/A;  . COLONOSCOPY WITH PROPOFOL N/A 03/19/2020   Procedure: COLONOSCOPY WITH PROPOFOL;  Surgeon: Lin Landsman, MD;  Location: [facility] ENDOSCOPY;  Service: Gastroenterology;  Laterality: N/A;  . CORONARY ANGIOPLASTY WITH STENT PLACEMENT  09/2009   LAD 3.0 X23 mm Xience DES, RCA: 4.0 X 15 mm Xience DES  . ELECTROMAGNETIC NAVIGATION BROCHOSCOPY N/A 10/27/2017   Procedure: ELECTROMAGNETIC NAVIGATION BRONCHOSCOPY;  Surgeon: Flora Lipps, MD;  Location: [facility] ORS;  Service: Cardiopulmonary;  Laterality: N/A;  . ESOPHAGEAL DILATION    . ESOPHAGOGASTRODUODENOSCOPY  2008  . ESOPHAGOGASTRODUODENOSCOPY  08/20/2012  . HERNIA REPAIR  07/20/2012   L inguinal hernia repair  . PENILE PROSTHESIS IMPLANT       Current Outpatient Medications  Medication Sig Dispense Refill  . albuterol (PROVENTIL HFA;VENTOLIN HFA) 108 (90 Base) MCG/ACT inhaler Inhale 2 puffs into the lungs every 6 (six) hours as needed for wheezing or shortness of breath. 1 Inhaler 2  . aspirin 81 MG tablet Take 81 mg by mouth daily.    [name] atorvastatin (LIPITOR) 80 MG tablet Take 1 tablet (80 mg total) by mouth daily. 90 tablet 3  . azithromycin (ZITHROMAX) 250 MG tablet Take 1 tablet (250 mg total) by mouth 3 (three) times a week. 12 each 6  . escitalopram (LEXAPRO) 10 MG tablet Take 10 mg by mouth every morning.    [name] esomeprazole (NEXIUM) 40 MG capsule Take 1 capsule (40 mg total) by mouth daily. 30 capsule 4  . Fluticasone-Salmeterol (ADVAIR) 500-50 MCG/DOSE AEPB Inhale 1 puff into the lungs 2 (two) times daily. 60 each 11  . furosemide (LASIX) 40 MG tablet Take 1 tablet (40 mg  total) by mouth daily. 90 tablet 3  . ipratropium-albuterol (DUONEB) 0.5-2.5 (3) MG/3ML SOLN USE 1 VIAL VIA NEBULIZER AND INHALE BY MOUTH 4 TIMES A DAY 1620 mL 1  . LORazepam (ATIVAN) 1 MG tablet Take 1 tablet (1 mg total) by mouth every 4 (four) hours while awake. 30 tablet 0  . losartan (COZAAR) 25 MG tablet Take 25 mg by mouth daily.    . mirtazapine (REMERON) 15 MG tablet Take 15 mg by mouth at bedtime.    . Multiple Vitamin (MULTIVITAMIN) capsule Take 1 capsule by mouth daily.    . nitroGLYCERIN (NITROSTAT) 0.4 MG SL tablet Place 1 tablet (0.4 mg total)  under the tongue every 5 (five) minutes as needed. 25 tablet 3  . OLANZapine (ZYPREXA) 2.5 MG tablet Take 2.5 mg by mouth at bedtime.    . OXYGEN Inhale 3 L into the lungs daily.     . OXYGEN Inhale 2.5 L into the lungs daily.    . sucralfate (CARAFATE) 1 g tablet Take 1 g by mouth 4 (four) times daily.    [name] tiotropium (SPIRIVA) 18 MCG inhalation capsule Place 1 capsule (18 mcg total) into inhaler and inhale daily. 30 capsule 11   No current facility-administered medications for this visit.    Allergies:   Prozac [fluoxetine hcl], Effexor xr [venlafaxine hcl er], and Wellbutrin [bupropion]    Social History:  The patient  reports that he quit smoking about 2 years ago. His smoking use included cigarettes. He has a 126.00 pack-year smoking history. He has never used smokeless tobacco. He reports that he does not drink alcohol and does not use drugs.   Family History:  The patient's family history includes Alcohol abuse in his sister and sister; COPD in his sister; COPD (age of onset: 93) in his mother; Coronary artery disease (age of onset: 46) in his father; Diabetes in his brother, brother, and father; Heart attack in his brother and father; Heart disease in his father; Hypertension in his brother; Lung cancer in his sister; Penile cancer in his brother.    ROS:  Please see the history of present illness.   Otherwise, review of systems  are positive for none.   All other systems are reviewed and negative.    PHYSICAL EXAM: VS:  BP 128/70 (BP Location: Left Arm, Patient Position: Sitting, Cuff Size: Normal)   Pulse 86   Ht 5\' 10"  (1.778 m)   Wt 194 lb 6 oz (88.2 kg)   SpO2 98%   BMI 27.89 kg/m  , BMI Body mass index is 27.89 kg/m. GEN: Well nourished, well developed, in no acute distress  HEENT: normal  Neck: no JVD, carotid bruits, or masses Cardiac: RRR; no murmurs, rubs, or gallops, trace edema  Respiratory:  clear to auscultation bilaterally  with very diminished breath sounds, normal work of breathing GI: soft, nontender, nondistended, + BS MS: no deformity or atrophy  Skin: warm and dry, no rash Neuro:  Strength and sensation are intact Psych: euthymic mood, full affect  EKG:  EKG is  ordered today. I reviewed his EKG which showed sinus rhythm with a PVC, incomplete right bundle branch block and left anterior fascicular block.  Old anterior infarct.   Recent Labs: 07/28/2020: ALT 19 03/30/2021: B Natriuretic Peptide 45.3; BUN 15; Creatinine, Ser 0.83; Hemoglobin 12.2; Platelets 215; Potassium 3.1; Sodium 140    Lipid Panel    Component Value Date/Time   CHOL 165 03/18/2021 1254   CHOL 143 07/27/2017 1525   TRIG 77 03/18/2021 1254   TRIG 65 01/15/2010 0000   HDL 69 03/18/2021 1254   HDL 62 07/27/2017 1525   CHOLHDL 2.4 03/18/2021 1254   VLDL 15 03/18/2021 1254   LDLCALC 81 03/18/2021 1254   LDLCALC 67 07/27/2017 1525      Wt Readings from Last 3 Encounters:  04/10/21 194 lb 6 oz (88.2 kg)  03/30/21 179 lb (81.2 kg)  03/18/21 191 lb 12.8 oz (87 kg)        ASSESSMENT AND PLAN:  1.  Coronary artery disease involving native coronary arteries without angina: Status post prior anterior infarct with drug-eluting stent placement to the  LAD and RCA in 2010.  Catheterization 2017 showed stable anatomy.  Currently with no convincing symptoms of angina.  Continue medical therapy.  2.    Chronic  systolic heart failure due to ischemic cardiomyopathy with mildly reduced EF: We have tried multiple beta-blockers most recently Toprol and bisoprolol.  He did not tolerate any of them likely due to his very severe COPD.  Recent echocardiogram showed stable ejection fraction of 40 to 45%.  Continue losartan.  I asked him to double check the dose of furosemide as he should be on 40 mg daily.  3.  Hyperlipidemia: Continue treatment with atorvastatin.  Recommend a target LDL of less than 70.  4.  Severe COPD: Followed by pulmonary: He is on home oxygen.  Pulmonary function testing in February was consistent with very severe COPD.  5.  Asymptomatic PVCs: Did not tolerate beta-blockers.  These seem to be stable.   Disposition:   FU with me in 6 months  Signed,  Kathlyn Sacramento, MD  04/10/2021 3:39 PM    [place]

## 2021-04-15 ENCOUNTER — Encounter: Payer: Self-pay | Admitting: Radiation Oncology

## 2021-04-15 ENCOUNTER — Ambulatory Visit
Admission: RE | Admit: 2021-04-15 | Discharge: 2021-04-15 | Disposition: A | Discharge status: Home / Self Care | Payer: Medicare Other | Source: Ambulatory Visit | Attending: Radiation Oncology | Admitting: Radiation Oncology

## 2021-04-15 VITALS — BP 153/97 | HR 103 | Temp 96.4°F | Resp 22 | Wt 189.7 lb

## 2021-04-15 DIAGNOSIS — Z9981 Dependence on supplemental oxygen: Secondary | ICD-10-CM | POA: Diagnosis not present

## 2021-04-15 DIAGNOSIS — K219 Gastro-esophageal reflux disease without esophagitis: Secondary | ICD-10-CM | POA: Diagnosis not present

## 2021-04-15 DIAGNOSIS — I509 Heart failure, unspecified: Secondary | ICD-10-CM | POA: Diagnosis not present

## 2021-04-15 DIAGNOSIS — J449 Chronic obstructive pulmonary disease, unspecified: Secondary | ICD-10-CM | POA: Diagnosis not present

## 2021-04-15 DIAGNOSIS — C61 Malignant neoplasm of prostate: Secondary | ICD-10-CM | POA: Insufficient documentation

## 2021-04-15 DIAGNOSIS — I2510 Atherosclerotic heart disease of native coronary artery without angina pectoris: Secondary | ICD-10-CM | POA: Insufficient documentation

## 2021-04-15 DIAGNOSIS — Z923 Personal history of irradiation: Secondary | ICD-10-CM | POA: Diagnosis not present

## 2021-04-15 NOTE — Progress Notes (Signed)
Radiation Oncology Follow up Note  Name: Leslie Sims   Date:   04/15/2021 MRN:  [id number] DOB: 1951-11-30    This 70 y.o. 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  Presenting with a PSA of 4.5  REFERRING PROVIDER: [name], MD  HPI: Patient is a 70 year old male with multiple medical comorbidities now seen out 4 months having completed IMRT radiation therapy for Gleason 7 adenocarcinoma the prostate presenting with a PSA of 4.5 seen today in routine follow-up from urologic standpoint he is doing well specifically denies any increased lower urinary tract symptoms does have some occasional loose stools did not follow any low residue diet or take Imodium.  His most recent PSA is 2.4..  Patient does have multiple Comorbidities including COPD on chronic oxygen.  CAD CHF and GERD.  Patient does have an ejection fraction of 35 to 40%.  COMPLICATIONS OF TREATMENT: none  FOLLOW UP COMPLIANCE: keeps appointments   PHYSICAL EXAM:  BP (!) 153/97   Pulse (!) 103   Temp (!) 96.4 F (35.8 C)   Resp (!) 22   Wt 189 lb 11.2 oz (86 kg)   SpO2 93%   BMI 27.22 kg/m  Patient is on nasal oxygen.  Well-developed well-nourished patient in NAD. HEENT reveals PERLA, EOMI, discs not visualized.  Oral cavity is clear. No oral mucosal lesions are identified. Neck is clear without evidence of cervical or supraclavicular adenopathy. Lungs are clear to A&P. Cardiac examination is essentially unremarkable with regular rate and rhythm without murmur rub or thrill. Abdomen is benign with no organomegaly or masses noted. Motor sensory and DTR levels are equal and symmetric in the upper and lower extremities. Cranial nerves II through XII are grossly intact. Proprioception is intact. No peripheral adenopathy or edema is identified. No motor or sensory levels are noted. Crude visual fields  are within normal range.  RADIOLOGY RESULTS: No current films for review  PLAN: Present time patient's PSA is trending in the right direction.  His multiple medical comorbidities certainly trump his prostate cancer at this time.  I have asked to see him back in 6 months for follow-up with a PSA at that time.  Patient knows to call any with any concerns.  He continues close follow-up care with cardiology.  I would like to take this opportunity to thank you for allowing me to participate in the care of your patient.Noreene Filbert, MD

## 2021-04-21 ENCOUNTER — Emergency Department: Payer: Medicare Other

## 2021-04-21 ENCOUNTER — Emergency Department
Admission: EM | Admit: 2021-04-21 | Discharge: 2021-04-21 | Disposition: A | Discharge status: Home / Self Care | Payer: Medicare Other | Attending: Emergency Medicine | Admitting: Emergency Medicine

## 2021-04-21 ENCOUNTER — Other Ambulatory Visit: Payer: Self-pay

## 2021-04-21 DIAGNOSIS — J449 Chronic obstructive pulmonary disease, unspecified: Secondary | ICD-10-CM | POA: Insufficient documentation

## 2021-04-21 DIAGNOSIS — I110 Hypertensive heart disease with heart failure: Secondary | ICD-10-CM | POA: Insufficient documentation

## 2021-04-21 DIAGNOSIS — Z7982 Long term (current) use of aspirin: Secondary | ICD-10-CM | POA: Insufficient documentation

## 2021-04-21 DIAGNOSIS — R0789 Other chest pain: Secondary | ICD-10-CM | POA: Diagnosis not present

## 2021-04-21 DIAGNOSIS — I5022 Chronic systolic (congestive) heart failure: Secondary | ICD-10-CM | POA: Diagnosis not present

## 2021-04-21 DIAGNOSIS — Z8546 Personal history of malignant neoplasm of prostate: Secondary | ICD-10-CM | POA: Insufficient documentation

## 2021-04-21 DIAGNOSIS — Z7952 Long term (current) use of systemic steroids: Secondary | ICD-10-CM | POA: Insufficient documentation

## 2021-04-21 DIAGNOSIS — Z87891 Personal history of nicotine dependence: Secondary | ICD-10-CM | POA: Diagnosis not present

## 2021-04-21 DIAGNOSIS — Z79899 Other long term (current) drug therapy: Secondary | ICD-10-CM | POA: Diagnosis not present

## 2021-04-21 DIAGNOSIS — I2510 Atherosclerotic heart disease of native coronary artery without angina pectoris: Secondary | ICD-10-CM | POA: Diagnosis not present

## 2021-04-21 LAB — HEPATIC FUNCTION PANEL
ALT: 27 U/L (ref 0–44)
AST: 31 U/L (ref 15–41)
Albumin: 4 g/dL (ref 3.5–5.0)
Alkaline Phosphatase: 67 U/L (ref 38–126)
Bilirubin, Direct: 0.1 mg/dL (ref 0.0–0.2)
Indirect Bilirubin: 0.6 mg/dL (ref 0.3–0.9)
Total Bilirubin: 0.7 mg/dL (ref 0.3–1.2)
Total Protein: 7 g/dL (ref 6.5–8.1)

## 2021-04-21 LAB — BASIC METABOLIC PANEL
Anion gap: 11 (ref 5–15)
BUN: 13 mg/dL (ref 8–23)
CO2: 30 mmol/L (ref 22–32)
Calcium: 9.2 mg/dL (ref 8.9–10.3)
Chloride: 97 mmol/L — ABNORMAL LOW (ref 98–111)
Creatinine, Ser: 1.05 mg/dL (ref 0.61–1.24)
GFR, Estimated: >60 mL/min (ref >60)
Glucose, Bld: 96 mg/dL (ref 70–99)
Potassium: 3.5 mmol/L (ref 3.5–5.1)
Sodium: 138 mmol/L (ref 135–145)

## 2021-04-21 LAB — CBC
HCT: 40.7 % (ref 39.0–52.0)
Hemoglobin: 13 g/dL (ref 13.0–17.0)
MCH: 29.3 pg (ref 26.0–34.0)
MCHC: 31.9 g/dL (ref 30.0–36.0)
MCV: 91.9 fL (ref 80.0–100.0)
Platelets: 222 10*3/uL (ref 150–400)
RBC: 4.43 MIL/uL (ref 4.22–5.81)
RDW: 12.4 % (ref 11.5–15.5)
WBC: 7 10*3/uL (ref 4.0–10.5)
nRBC: 0 % (ref 0.0–0.2)

## 2021-04-21 LAB — LIPASE, BLOOD: Lipase: 28 U/L (ref 11–51)

## 2021-04-21 LAB — TROPONIN I (HIGH SENSITIVITY)
Troponin I (High Sensitivity): 9 ng/L (ref <18)
Troponin I (High Sensitivity): 11 ng/L (ref <18)

## 2021-04-21 IMAGING — CR DG CHEST 2V
2 series · 2 of 2 positions shown · non-contrast
Comparison: [DATE]

CLINICAL DATA: COPD, burning sensation in chest

EXAM:
CHEST - 2 VIEW

[chest pa]
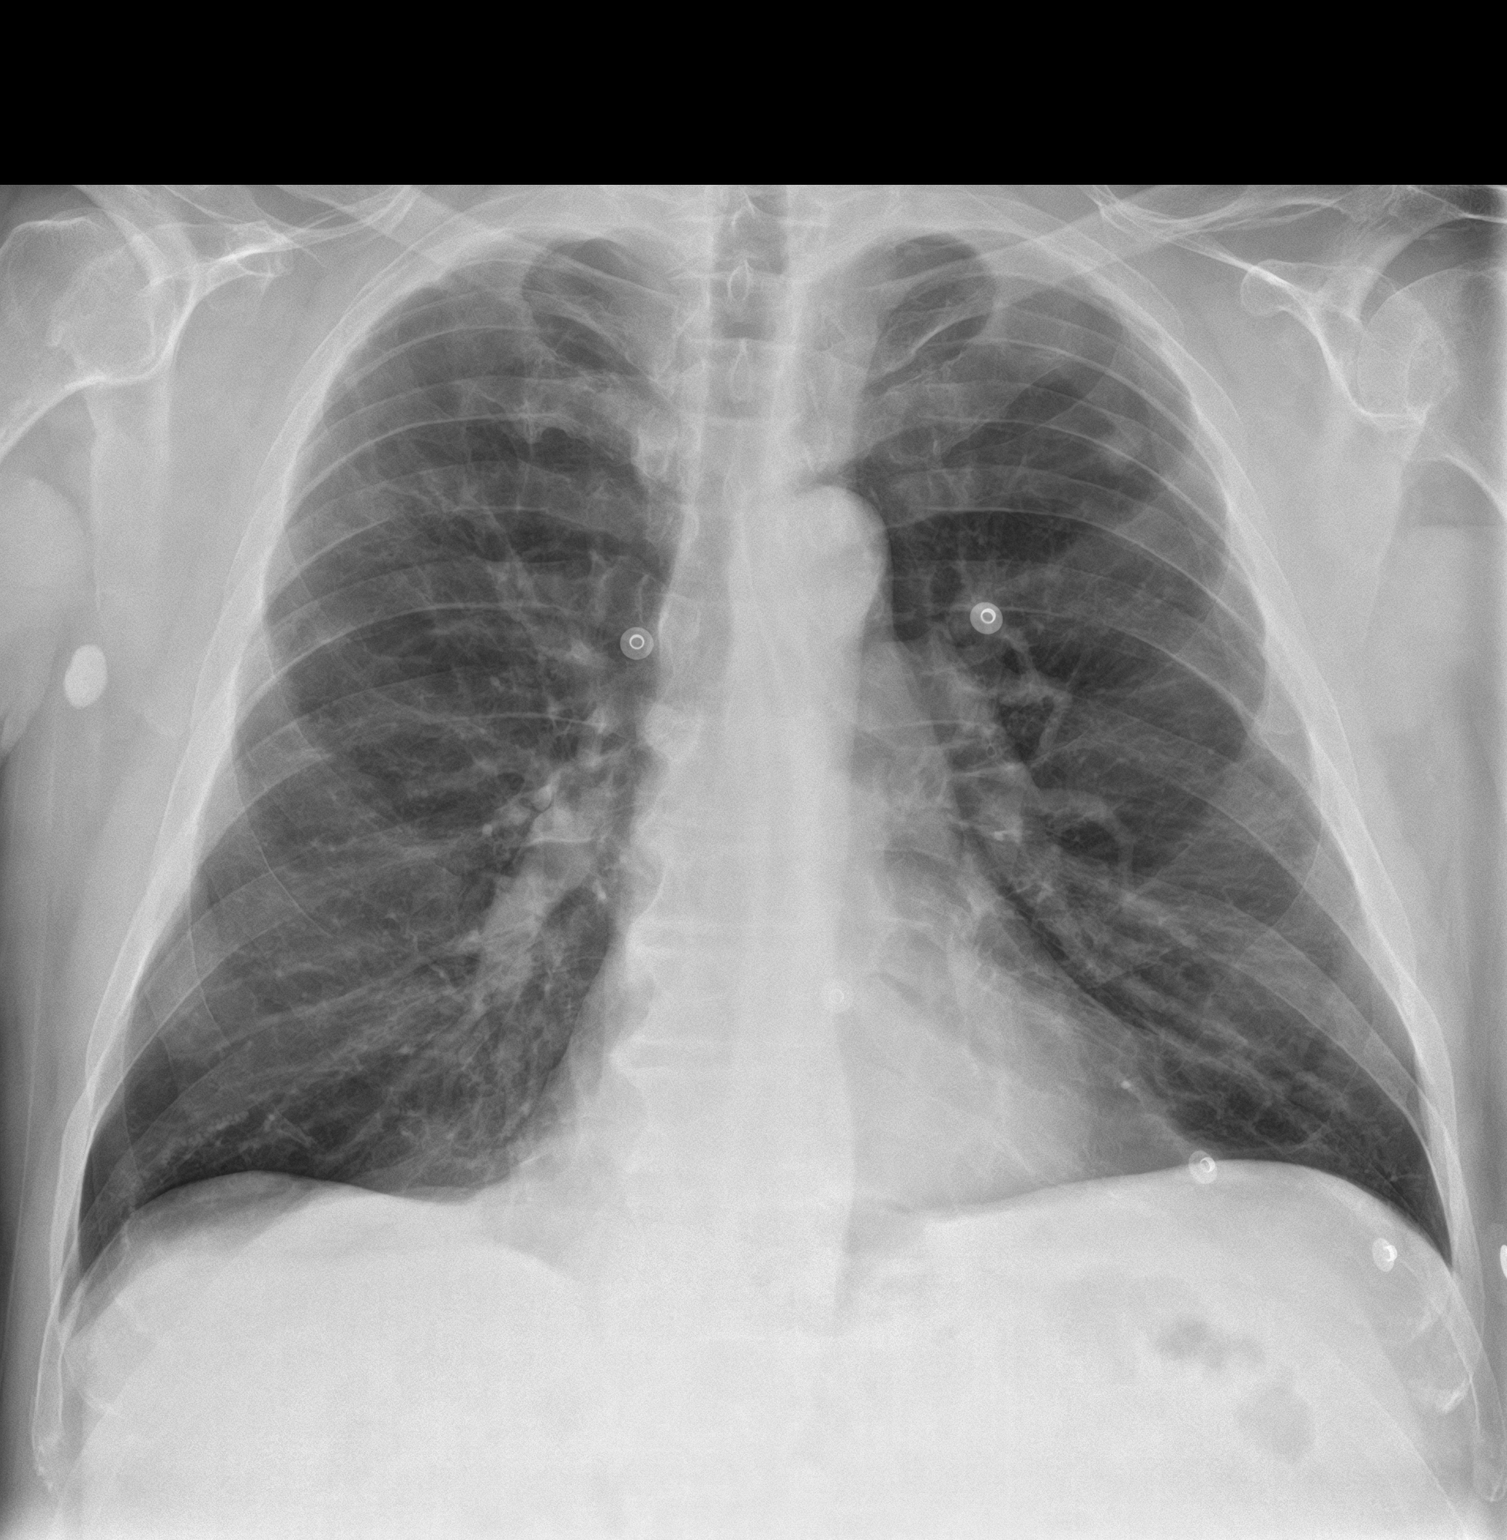

[chest lat]
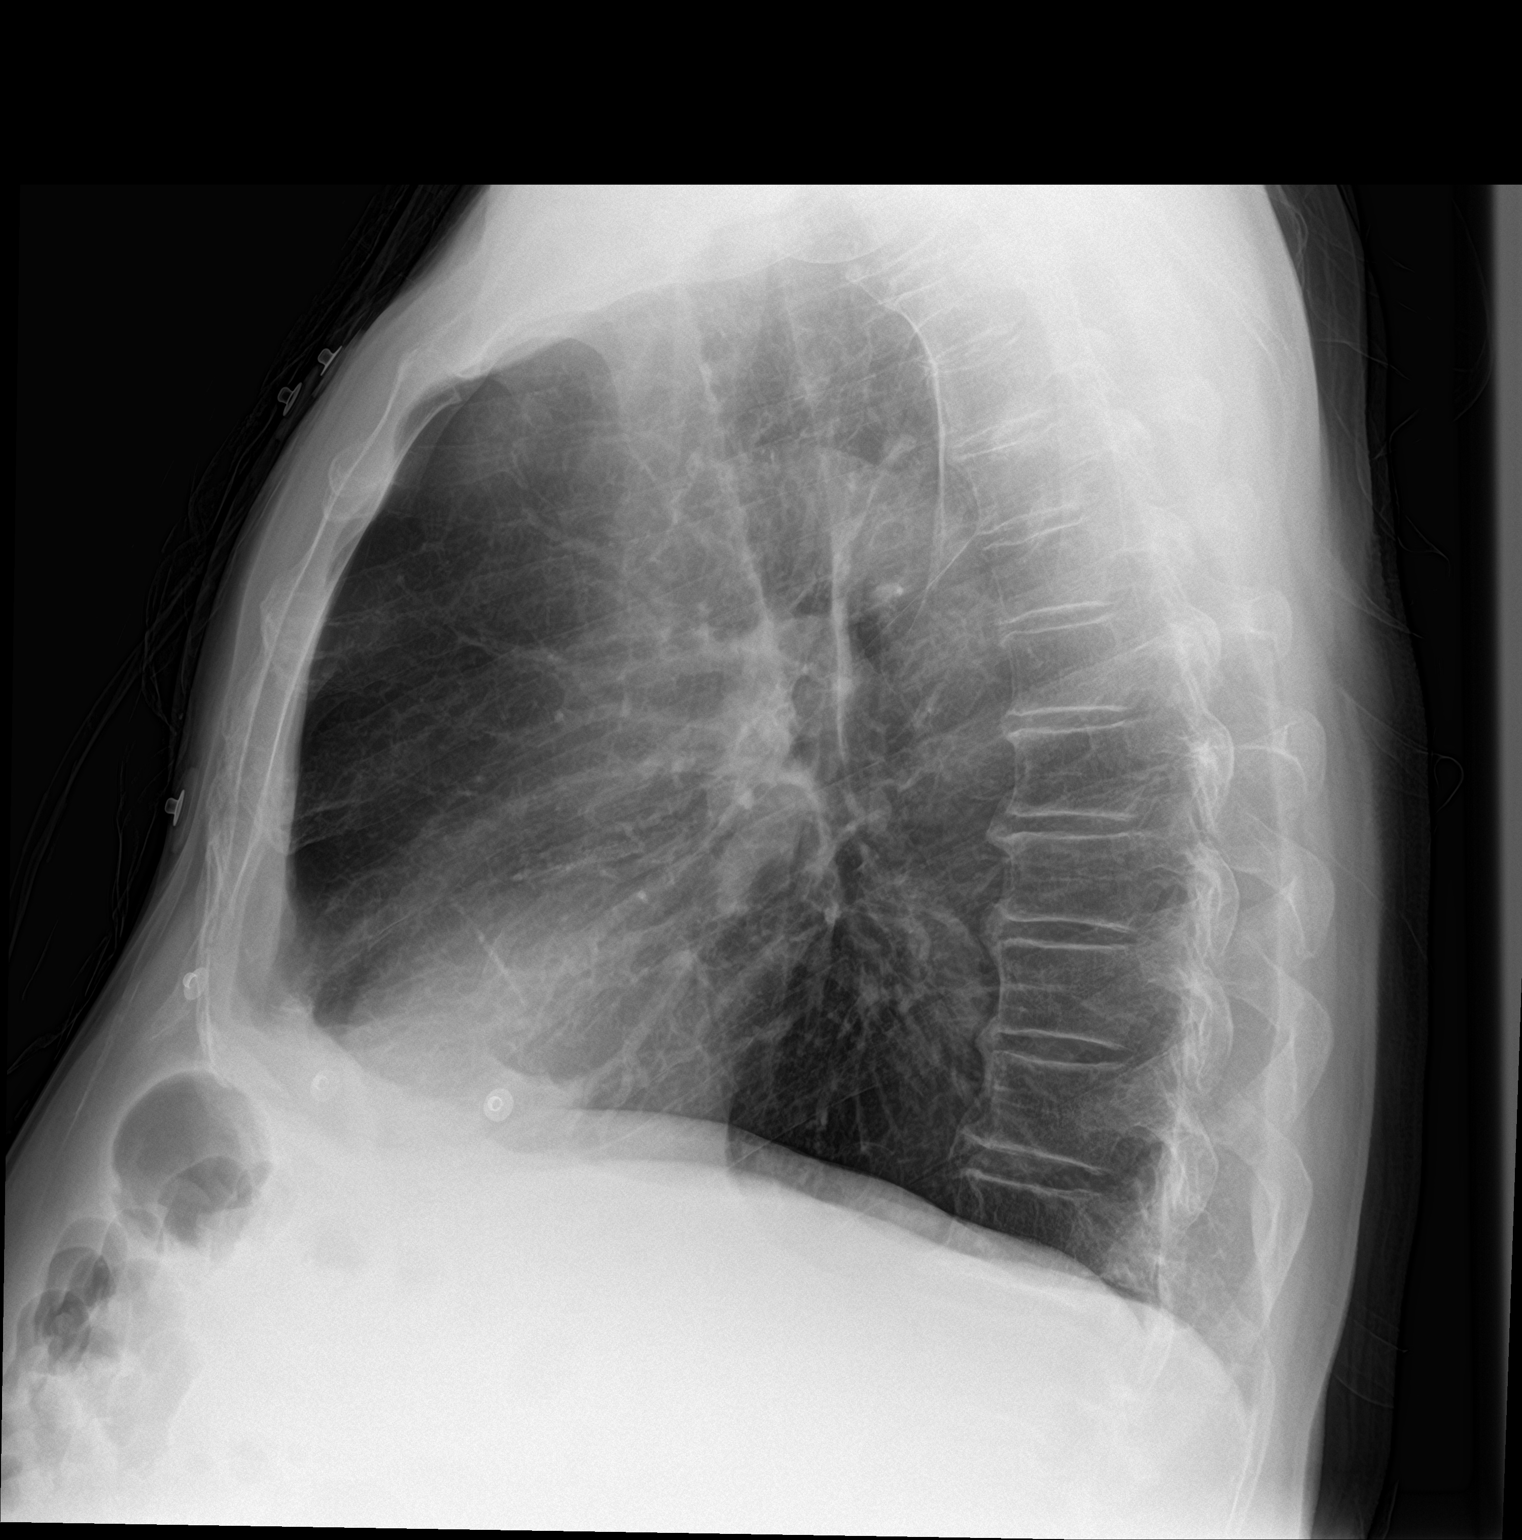

[2 of 2 positions shown; findings below may reference images not displayed]

FINDINGS: Frontal and lateral views of the chest demonstrate a stable cardiac
silhouette. There is background emphysema and hyperinflation,
without acute airspace disease, effusion, or pneumothorax. Stable
bilateral areas of scarring. Stable 1 cm left upper lobe pulmonary
nodule. No acute bony abnormalities.
IMPRESSION: 1. No acute intrathoracic process.
2. Stable emphysema and bilateral parenchymal lung scarring.
3. Stable 1 cm left upper lobe pulmonary nodule.

## 2021-04-21 NOTE — ED Triage Notes (Signed)
Pt states that today when he woke up this am he was awakened with a "thump" sensation in his chest, pt states that he has a hx of copd and using 2.5 L of O2 at home, states that he was seen by his cardiologist a week ago and told everything looked good. Pt states that he is having a burning sensation in his chest and states that he took 4 baby asa pta

## 2021-04-21 NOTE — ED Provider Notes (Signed)
[facility] Emergency Department Provider Note ____________________________________________   Event Date/Time   First MD Initiated Contact with Patient 04/21/21 2019     (approximate)  I have reviewed the triage vital signs and the nursing notes.   HISTORY  Chief Complaint Chest Pain    HPI Leslie Sims is a 70 y.o. male with PMH as noted below including COPD on home O2 and CAD who presents with chest pain over the last few days, feeling like a tightness in his mid chest along with some burning on the left side. The pain is intermittent and nonexertional.  He is not having pain currently.  He denies any associated nausea, lightheadedness or worsened shortness of breath from baseline.    Past Medical History:  Diagnosis Date  . Allergic rhinitis   . Anxiety   . Back pain   . Cancer [facility])    prostate  . Colon polyps 08/2012   colonoscopy; multiple colon polyps; repeat colonoscopy in one year.  [name] COPD (chronic obstructive pulmonary disease) ([place])    a. on home O2.  . Coronary artery disease 11/2009   a. late presenting ant MI; b. LHC 100% pLAD s/p PCI/DES, 99% mRCA s/p PCI/DES, EF 35%; c. nuclear stress test 05/13: prior ant/inf infarcts w/o ischemia, EF 43%; d. LHC 02/14: widely patent stents with no other obs dz, EF 40%; e. LHC 11/17: LM nl, mLAD 10%, patent LAD stent, dLAD 20%, p-mRCA 10%, mRCA 40%, patent RCA stent, EF 35-45%   . Depression   . GERD (gastroesophageal reflux disease)   . Headache(784.0)   . Helicobacter pylori (H. pylori)   . Hemorrhoids   . Hernia    inguinal  . HFrEF (heart failure with reduced ejection fraction) ([place])    a. 10/2016 LV gram: EF 35-45%; b. 06/2018 Echo: EF 40-45%. c. 01/2021 Echo: EF 40-45%  . Hyperlipidemia   . Hypertension   . Insomnia   . Ischemic cardiomyopathy    a. 10/2016 LV gram: EF 35-45%; b. 06/2018 Echo: EF 40-45%.  . Myalgia   . Peptic ulcer   . Pulmonary nodules   . ST elevation (STEMI)  myocardial infarction involving left anterior descending coronary artery ([place]) 11/2009   a. s/p PCI to the LAD  . Status post dilation of esophageal narrowing 2000  . Tinea pedis   . Wears dentures    partial upper    Patient Active Problem List   Diagnosis Date Noted  . Prostate cancer ([place]) 10/11/2020  . Elevated PSA 07/28/2020  . Iron deficiency anemia 05/20/2020  . Goals of care, counseling/discussion   . Palliative care by specialist   . Healthcare-associated pneumonia 01/28/2019  . COPD with acute exacerbation ([place]) 12/31/2018  . Generalized anxiety disorder 11/12/2018  . Moderate episode of recurrent major depressive disorder ([place]) 11/12/2018  . Acute respiratory failure with hypoxia ([place]) 09/29/2018  . Acute respiratory failure with hypoxia and hypercapnia ([place]) 09/13/2018  . Acute on chronic respiratory failure with hypoxia and hypercapnia ([place]) 08/24/2018  . Chest pain 07/02/2018  . Grief reaction 01/13/2018  . COPD with hypoxia ([place]) 12/25/2017  . Unstable angina ([place])   . Benign neoplasm of ascending colon   . Benign neoplasm of descending colon   . Benign neoplasm of sigmoid colon   . History of colonic polyps   . Pulmonary nodules/lesions, multiple 01/05/2015  . Bruit of left carotid artery 11/04/2014  . Sleep disorder 07/18/2013  . Seasonal and perennial allergic rhinitis 05/29/2013  .  Bradycardia 04/20/2011  . SMOKER 07/30/2010  . CAD, NATIVE VESSEL 04/23/2010  . Hyperlipidemia 03/24/2010  . DEPRESSION/ANXIETY 03/24/2010  . Chronic systolic heart failure ([place]) 03/24/2010  . COPD, very severe ([place]) 03/24/2010  . GERD 03/24/2010  . Essential hypertension 11/21/2009    Past Surgical History:  Procedure Laterality Date  . Admission  12/20/2012   COPD exacerbation.  [place].  [name] CARDIAC CATHETERIZATION    . CARDIAC CATHETERIZATION  02/05/13   [facility]  . CARDIAC CATHETERIZATION  10/14   [facility] : patent stents with no change in anatomy. EF: 40$  . CARDIAC  CATHETERIZATION Left 11/12/2016   Procedure: Left Heart Cath and Coronary Angiography;  Surgeon: Wellington Hampshire, MD;  Location: [place] CV LAB;  Service: Cardiovascular;  Laterality: Left;  . COLONOSCOPY    . COLONOSCOPY WITH PROPOFOL N/A 05/18/2016   Procedure: COLONOSCOPY WITH PROPOFOL;  Surgeon: Lucilla Lame, MD;  Location: [facility] ENDOSCOPY;  Service: Endoscopy;  Laterality: N/A;  . COLONOSCOPY WITH PROPOFOL N/A 03/19/2020   Procedure: COLONOSCOPY WITH PROPOFOL;  Surgeon: Lin Landsman, MD;  Location: [facility] ENDOSCOPY;  Service: Gastroenterology;  Laterality: N/A;  . CORONARY ANGIOPLASTY WITH STENT PLACEMENT  09/2009   LAD 3.0 X23 mm Xience DES, RCA: 4.0 X 15 mm Xience DES  . ELECTROMAGNETIC NAVIGATION BROCHOSCOPY N/A 10/27/2017   Procedure: ELECTROMAGNETIC NAVIGATION BRONCHOSCOPY;  Surgeon: Flora Lipps, MD;  Location: [facility] ORS;  Service: Cardiopulmonary;  Laterality: N/A;  . ESOPHAGEAL DILATION    . ESOPHAGOGASTRODUODENOSCOPY  2008  . ESOPHAGOGASTRODUODENOSCOPY  08/20/2012  . HERNIA REPAIR  07/20/2012   L inguinal hernia repair  . PENILE PROSTHESIS IMPLANT      Prior to Admission medications   Medication Sig Start Date End Date Taking? Authorizing Provider  albuterol (PROVENTIL HFA;VENTOLIN HFA) 108 (90 Base) MCG/ACT inhaler Inhale 2 puffs into the lungs every 6 (six) hours as needed for wheezing or shortness of breath. 11/01/18   Rudene Re, MD  aspirin 81 MG tablet Take 81 mg by mouth daily.    [provider]  atorvastatin (LIPITOR) 80 MG tablet Take 1 tablet (80 mg total) by mouth daily. 03/18/21 06/16/21  Rise Mu, PA-C  escitalopram (LEXAPRO) 10 MG tablet Take 10 mg by mouth every morning. 01/30/21   [provider]  esomeprazole (NEXIUM) 40 MG capsule Take 1 capsule (40 mg total) by mouth daily. 03/18/21 08/15/21  [name], MD  Fluticasone-Salmeterol (ADVAIR) 500-50 MCG/DOSE AEPB Inhale 1 puff into the lungs 2 (two) times daily. 03/18/21    [name], MD  furosemide (LASIX) 40 MG tablet Take 1 tablet (40 mg total) by mouth daily. 02/06/21 02/01/22  Theora Gianotti, NP  ipratropium-albuterol (DUONEB) 0.5-2.5 (3) MG/3ML SOLN USE 1 VIAL VIA NEBULIZER AND INHALE BY MOUTH 4 TIMES A DAY 02/20/21   [name], MD  LORazepam (ATIVAN) 1 MG tablet Take 1 tablet (1 mg total) by mouth every 4 (four) hours while awake. 02/14/19   [name], MD  losartan (COZAAR) 25 MG tablet Take 25 mg by mouth daily. 02/23/19   [provider]  mirtazapine (REMERON) 15 MG tablet Take 15 mg by mouth at bedtime. 06/02/20   [provider]  Multiple Vitamin (MULTIVITAMIN) capsule Take 1 capsule by mouth daily.    [provider]  nitroGLYCERIN (NITROSTAT) 0.4 MG SL tablet Place 1 tablet (0.4 mg total) under the tongue every 5 (five) minutes as needed. 06/04/20   Loel Dubonnet, NP  OLANZapine (ZYPREXA) 2.5  MG tablet Take 2.5 mg by mouth at bedtime.    [provider]  OXYGEN Inhale 3 L into the lungs daily.     [provider]  OXYGEN Inhale 2.5 L into the lungs daily.    [provider]  sucralfate (CARAFATE) 1 g tablet Take 1 g by mouth 4 (four) times daily. 06/03/20   [provider]  tiotropium (SPIRIVA) 18 MCG inhalation capsule Place 1 capsule (18 mcg total) into inhaler and inhale daily. 03/18/21   [name], MD    Allergies Prozac [fluoxetine hcl], Effexor xr [venlafaxine hcl er], and Wellbutrin [bupropion]  Family History  Problem Relation Age of Onset  . Heart attack Brother        Brother #1  . Diabetes Brother   . Hypertension Brother        #3  . Coronary artery disease Father 57       deceased  . Heart attack Father   . Diabetes Father   . Heart disease Father   . COPD Mother 45       deceased  . Alcohol abuse Sister        polysubstance abuse  . COPD Sister   . Lung cancer Sister   . Alcohol abuse Sister        polysubstance abuse  .  Penile cancer Brother   . Diabetes Brother   . Prostate cancer Neg Hx   . Bladder Cancer Neg Hx   . Kidney cancer Neg Hx     Social History Social History   Tobacco Use  . Smoking status: Former Smoker    Packs/day: 3.00    Years: 42.00    Pack years: 126.00    Types: Cigarettes    Quit date: 02/10/2019    Years since quitting: 2.1  . Smokeless tobacco: Never Used  . Tobacco comment: 25 months ago most smoked 4 packs a day .  Vaping Use  . Vaping Use: Never used  Substance Use Topics  . Alcohol use: No    Alcohol/week: 0.0 standard drinks  . Drug use: No    Review of Systems  Constitutional: No fever/chills Eyes: No visual changes. ENT: No sore throat. Cardiovascular: Positive for chest pain. Respiratory: Denies acute shortness of breath. Gastrointestinal: No nausea, no vomiting.  No diarrhea.  Genitourinary: Negative for dysuria.  Musculoskeletal: Negative for back pain. Skin: Negative for rash. Neurological: Negative for headaches, focal weakness or numbness.   ____________________________________________   PHYSICAL EXAM:  VITAL SIGNS: ED Triage Vitals  Enc Vitals Group     BP 04/21/21 1857 (!) 133/115     Pulse Rate 04/21/21 1857 74     Resp 04/21/21 1857 20     Temp 04/21/21 1857 98.6 F (37 C)     Temp Source 04/21/21 1857 Oral     SpO2 04/21/21 1857 98 %     Weight 04/21/21 1858 190 lb (86.2 kg)     Height 04/21/21 1858 5\' 10"  (1.778 m)     Head Circumference --      Peak Flow --      Pain Score 04/21/21 1858 4     Pain Loc --      Pain Edu? --      Excl. in [place]? --     Constitutional: Alert and oriented.Somewhat chronically ill appearing but in no acute distress. Eyes: Conjunctivae are normal.  Head: Atraumatic. Nose: No congestion/rhinnorhea. Mouth/Throat: Mucous membranes are moist.   Neck:  Normal range of motion.  Cardiovascular: Normal rate, regular rhythm. Grossly normal heart sounds.  Good peripheral circulation. Respiratory: Normal  respiratory effort.  No retractions. Lungs CTAB. Gastrointestinal: Soft and nontender. No distention.  Genitourinary: No flank tenderness. Musculoskeletal: No lower extremity edema.  Extremities warm and well perfused.  Neurologic:  Normal speech and language. No gross focal neurologic deficits are appreciated.  Skin:  Skin is warm and dry. No rash noted. Psychiatric: Mood and affect are normal. Speech and behavior are normal.  ____________________________________________   LABS (all labs ordered are listed, but only abnormal results are displayed)  Labs Reviewed  BASIC METABOLIC PANEL - Abnormal; Notable for the following components:      Result Value   Chloride 97 (*)    All other components within normal limits  CBC  HEPATIC FUNCTION PANEL  LIPASE, BLOOD  TROPONIN I (HIGH SENSITIVITY)  TROPONIN I (HIGH SENSITIVITY)   ____________________________________________  EKG  ED ECG REPORT I, Arta Silence, the attending physician, personally viewed and interpreted this ECG.  Date: 04/21/2021 EKG Time: 1845 Rate: 75 Rhythm: normal sinus rhythm QRS Axis: Left axis Intervals: normal ST/T Wave abnormalities: Nonspecific ST abnormality Narrative Interpretation: Nonspecific ST abnormality with no evidence of acute ischemia; no significant change when compared to EKG of 02/20/2021  ____________________________________________  RADIOLOGY  Chest X-ray interpreted by me shows no focal consolidation or edema  ____________________________________________   PROCEDURES  Procedure(s) performed: No  Procedures  Critical Care performed: No ____________________________________________   INITIAL IMPRESSION / ASSESSMENT AND PLAN / ED COURSE  Pertinent labs & imaging results that were available during my care of the patient were reviewed by me and considered in my medical decision making (see chart for details).  70 year old male with PMH as noted above including COPD on home  O2 and CAD presents with intermittent atypical chest pain over the last several days.   I reviewed the past medical records in Epic; the patient was most recently seen by his cardiologist Dr. Fletcher Anon a few weeks ago.  He had no acute issues at that time.  He is recommended for continued medical therapy and last had a cath in 2017.  On exam the patient is overall comfortable appearing.  His vital signs are normal.  Physical exam is unremarkable.  EKG shows nonspecific ST abnormalities not significantly changed from prior.   Differential includes most likely musculoskeletal pain versus GERD or other benign etiology, or less likely COPD exacerbation.  I have a low suspicion for ACS on this presentation although the patient is at elevated risk.  There is no clinical evidence for PE, aortic dissection or other vascular cause given the intermittent pain, reassuring vital signs and well appearance.  We will obtain basic and hepatobiliary labs, troponins x2 and reassess.   ----------------------------------------- 12:00 AM on 04/22/2021 -----------------------------------------  Lab workup is unremarkable.  Troponins are negative x2.  The patient has no active pain at this time.  He wants to go home; he is stable for discharge at this time.  Return precautions given and he expresses understanding.     ____________________________________________   FINAL CLINICAL IMPRESSION(S) / ED DIAGNOSES  Final diagnoses:  Atypical chest pain      NEW MEDICATIONS STARTED DURING THIS VISIT:  Discharge Medication List as of 04/21/2021 11:06 PM       Note:  This document was prepared using Dragon voice recognition software and may include unintentional dictation errors.    Arta Silence, MD 04/22/21 [phone]

## 2021-04-21 NOTE — Discharge Instructions (Addendum)
Return to the ER for new, worsening, or persistent severe chest pain, difficulty breathing, weakness or lightheadedness, abdominal pain, vomiting, fever, or any other new or worsening symptoms that concern you.  Follow-up with your primary care doctor and with Dr. Fletcher Anon.

## 2021-04-21 NOTE — ED Notes (Signed)
Assumed care of pt, denies needs or concerns. Awaiting re-eval and dispo. AOx4. Breathing regular and unlabored

## 2021-04-21 NOTE — ED Triage Notes (Signed)
Pt comes into the ED via EMS from home with c/o cental chest pain that radiates into the left chest for the past , hx of stent placement. ASA 324mg  taken at home PTA  136/79 96% 2LNC on home O2 continuously  75HR [id number] #[id number]

## 2021-04-21 NOTE — ED Notes (Addendum)
Pt wheeled to lobby to wait for ride, placed on hospital O2 while waiting. AO x4. Pt reports he is able to switch to home o2 when ride arrives. Breathing regular and unlabored

## 2021-05-04 ENCOUNTER — Ambulatory Visit (INDEPENDENT_AMBULATORY_CARE_PROVIDER_SITE_OTHER): Payer: Medicare Other

## 2021-05-04 ENCOUNTER — Ambulatory Visit
Admission: EM | Admit: 2021-05-04 | Discharge: 2021-05-04 | Disposition: A | Discharge status: Home / Self Care | Payer: Medicare Other | Attending: Family Medicine | Admitting: Family Medicine

## 2021-05-04 DIAGNOSIS — Z87891 Personal history of nicotine dependence: Secondary | ICD-10-CM | POA: Insufficient documentation

## 2021-05-04 DIAGNOSIS — Z8249 Family history of ischemic heart disease and other diseases of the circulatory system: Secondary | ICD-10-CM | POA: Insufficient documentation

## 2021-05-04 DIAGNOSIS — Z79899 Other long term (current) drug therapy: Secondary | ICD-10-CM | POA: Insufficient documentation

## 2021-05-04 DIAGNOSIS — J441 Chronic obstructive pulmonary disease with (acute) exacerbation: Secondary | ICD-10-CM | POA: Diagnosis not present

## 2021-05-04 DIAGNOSIS — I110 Hypertensive heart disease with heart failure: Secondary | ICD-10-CM | POA: Insufficient documentation

## 2021-05-04 DIAGNOSIS — Z801 Family history of malignant neoplasm of trachea, bronchus and lung: Secondary | ICD-10-CM | POA: Diagnosis not present

## 2021-05-04 DIAGNOSIS — Z7951 Long term (current) use of inhaled steroids: Secondary | ICD-10-CM | POA: Diagnosis not present

## 2021-05-04 DIAGNOSIS — Z20822 Contact with and (suspected) exposure to covid-19: Secondary | ICD-10-CM | POA: Diagnosis not present

## 2021-05-04 DIAGNOSIS — I5022 Chronic systolic (congestive) heart failure: Secondary | ICD-10-CM | POA: Insufficient documentation

## 2021-05-04 DIAGNOSIS — R062 Wheezing: Secondary | ICD-10-CM | POA: Diagnosis not present

## 2021-05-04 DIAGNOSIS — R0602 Shortness of breath: Secondary | ICD-10-CM | POA: Diagnosis not present

## 2021-05-04 DIAGNOSIS — I2510 Atherosclerotic heart disease of native coronary artery without angina pectoris: Secondary | ICD-10-CM | POA: Insufficient documentation

## 2021-05-04 DIAGNOSIS — I252 Old myocardial infarction: Secondary | ICD-10-CM | POA: Diagnosis not present

## 2021-05-04 DIAGNOSIS — Z955 Presence of coronary angioplasty implant and graft: Secondary | ICD-10-CM | POA: Insufficient documentation

## 2021-05-04 DIAGNOSIS — E785 Hyperlipidemia, unspecified: Secondary | ICD-10-CM | POA: Insufficient documentation

## 2021-05-04 IMAGING — CR DG CHEST 2V
2 series · 2 of 2 positions shown · non-contrast
Comparison: [DATE], CT [DATE]

CLINICAL DATA: Shortness of breath and wheezing

EXAM:
CHEST - 2 VIEW

[chest pa]
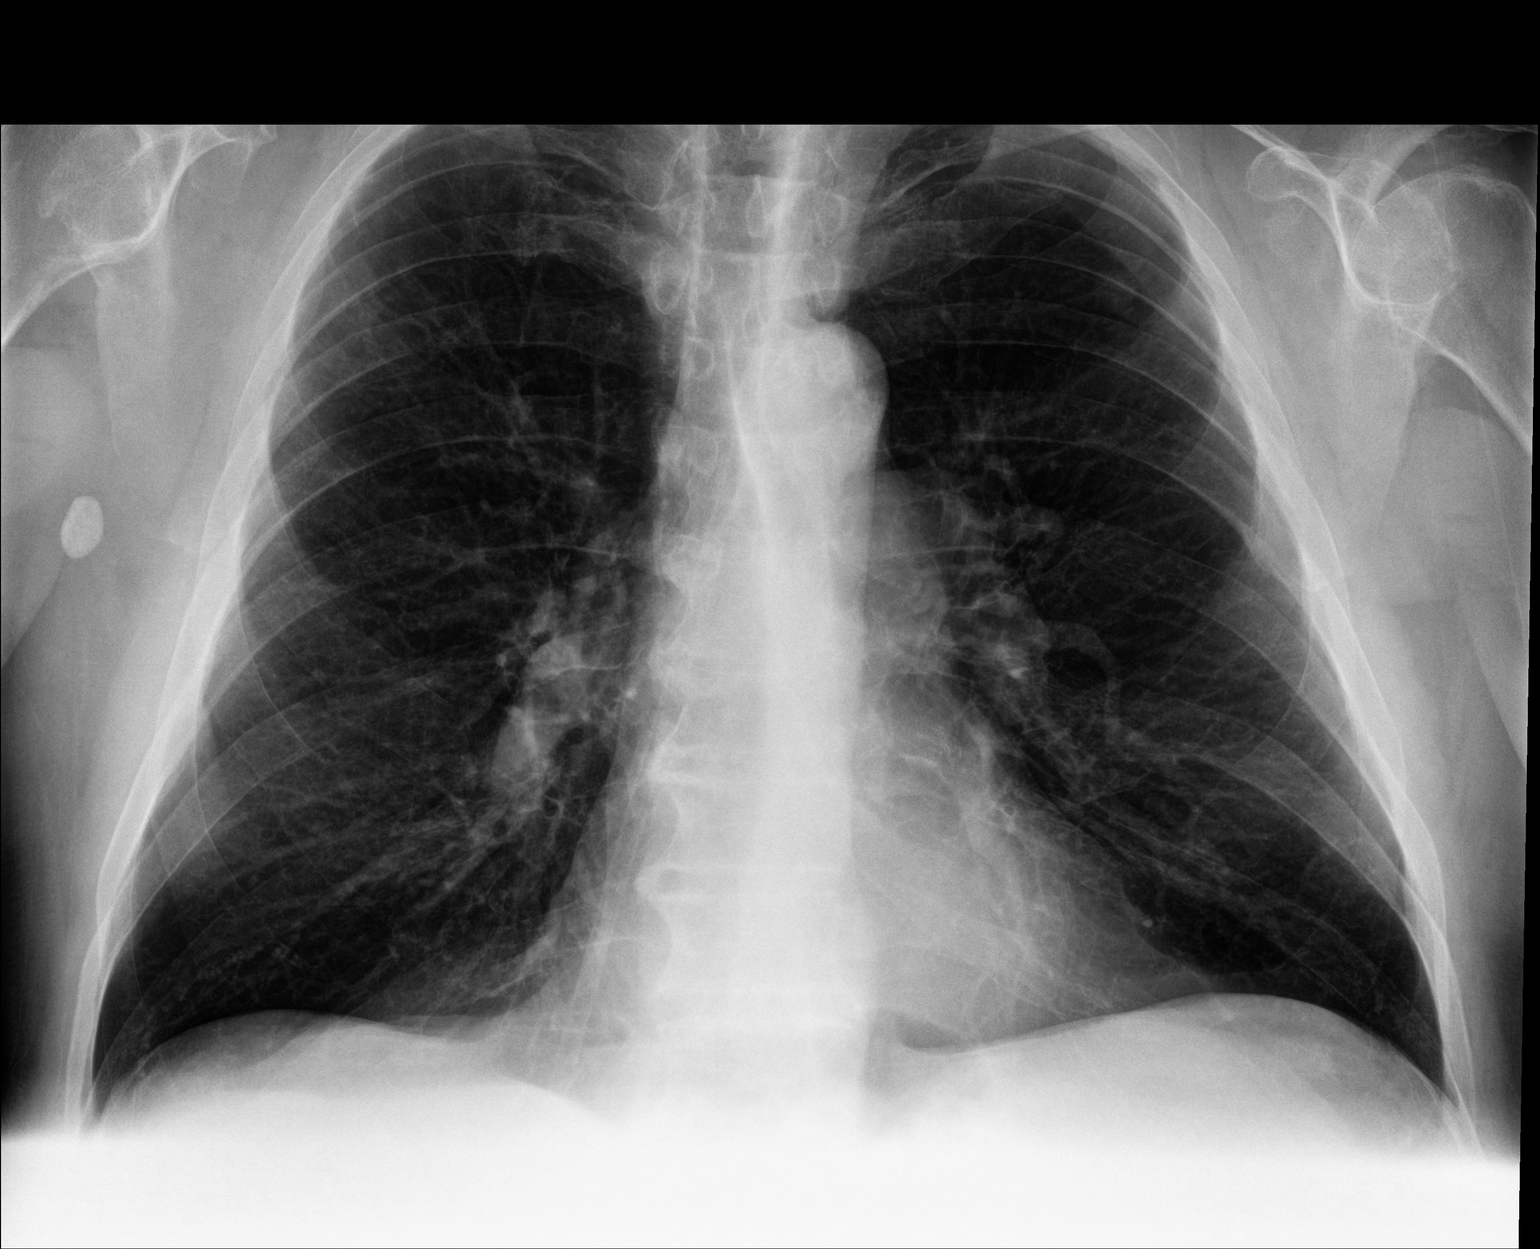

[chest lat]
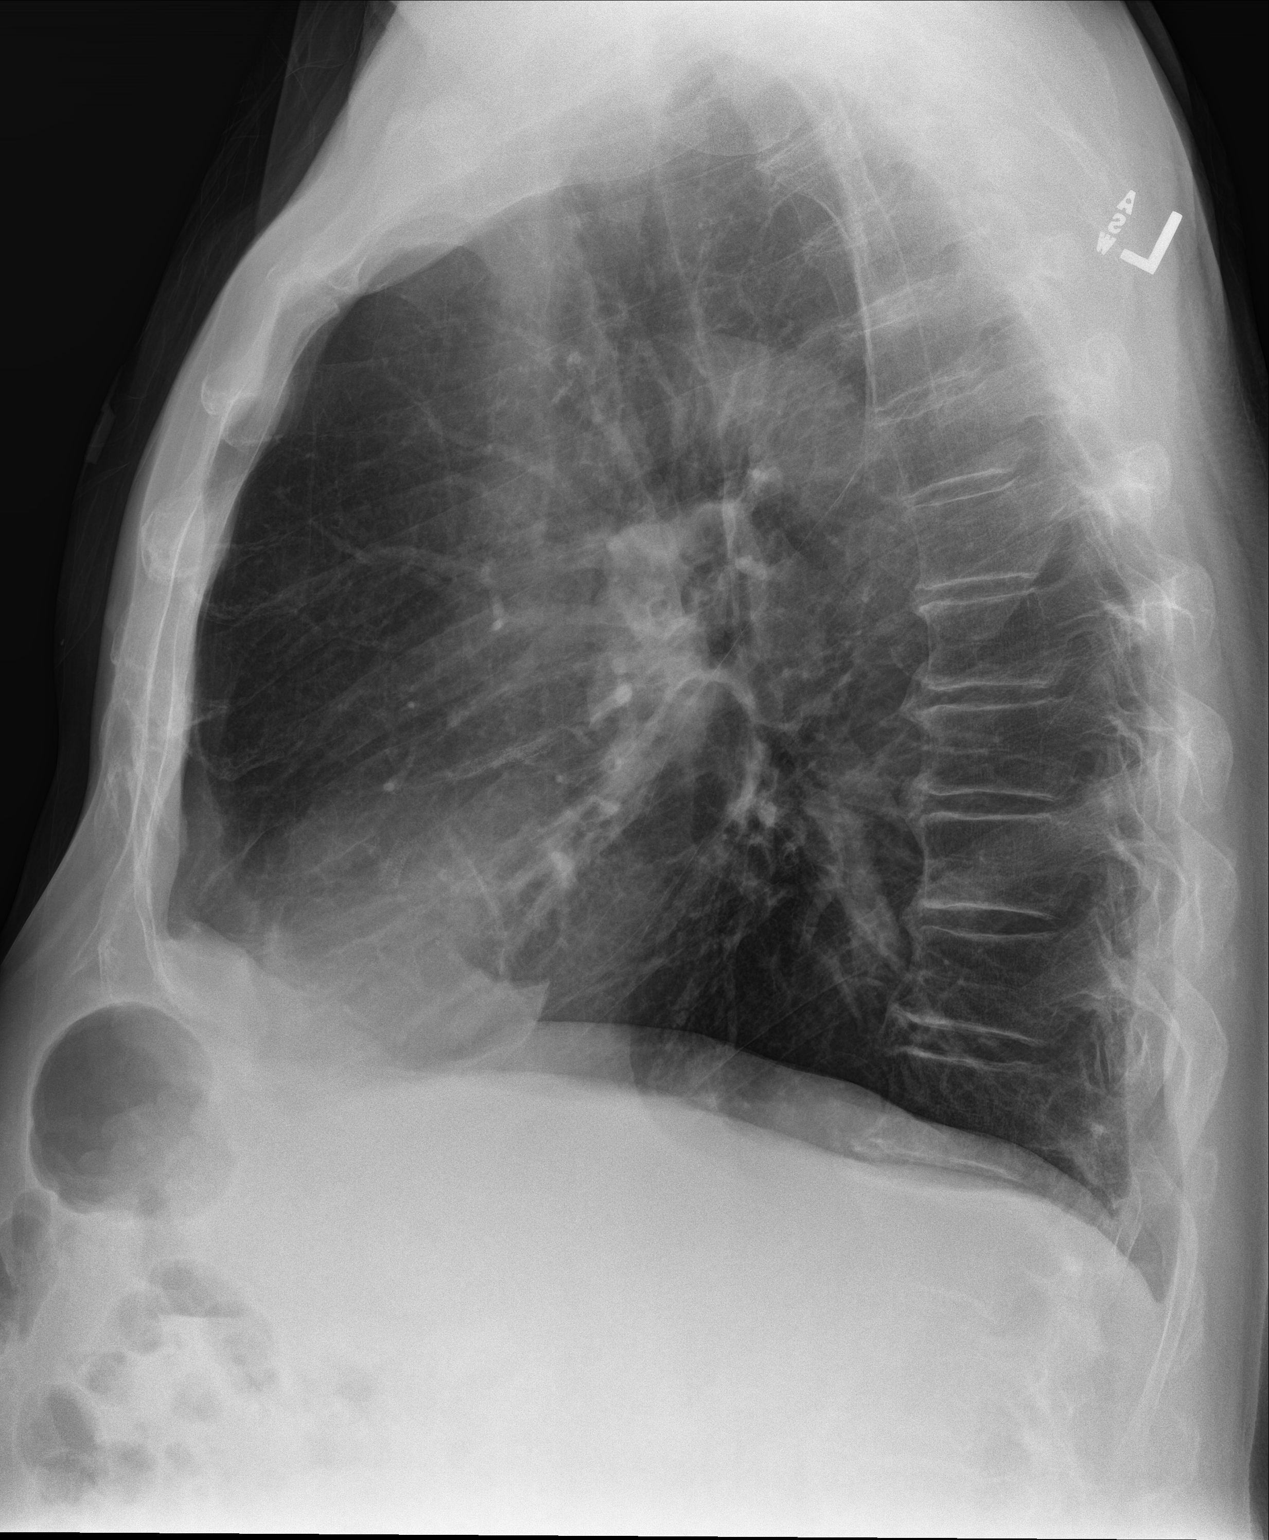

[2 of 2 positions shown; findings below may reference images not displayed]

FINDINGS: Hyperinflation with emphysema. 1 cm left upper lobe lung nodule.
Scarring in the right upper lobe. Normal heart size. No
pneumothorax. No acute airspace disease
IMPRESSION: 1. Emphysema without acute airspace opacity
2. Stable 1 cm left upper lobe lung nodule

## 2021-05-04 MED ORDER — ALBUTEROL SULFATE HFA 108 (90 BASE) MCG/ACT IN AERS: 2.0000 | INHALATION_SPRAY | Freq: Four times a day (QID) | RESPIRATORY_TRACT | 2 refills | Status: DC | PRN

## 2021-05-04 MED ORDER — AEROCHAMBER MV MISC: 2 refills | Status: AC

## 2021-05-04 MED ORDER — PREDNISONE 50 MG PO TABS: ORAL_TABLET | ORAL | 0 refills | Status: DC

## 2021-05-04 NOTE — ED Provider Notes (Signed)
[facility]    CSN: [id number] Arrival date & time: 05/04/21  1542      History   Chief Complaint Chief Complaint  Patient presents with  . COPD exacerbation    HPI Dandrae Kustra is a 70 y.o. male.   HPI   70 year old patient here for questionable COPD exacerbation.  That he has had an increase in wheezing over the last few days and is concerned that he may have contracted COVID from one of his friends who was recently diagnosed as COVID-positive.  Patient denies fever, runny nose, cough, or recent increase in his O2 demand.  Patient reports that he has been using his nebulizer at home but he thinks that its not helping as much as it used to.  He states that his compressor is quite old and it does not make an effective mist when he takes his treatments.  Past Medical History:  Diagnosis Date  . Allergic rhinitis   . Anxiety   . Back pain   . Cancer [facility])    prostate  . Colon polyps 08/2012   colonoscopy; multiple colon polyps; repeat colonoscopy in one year.  [name] COPD (chronic obstructive pulmonary disease) ([place])    a. on home O2.  . Coronary artery disease 11/2009   a. late presenting ant MI; b. LHC 100% pLAD s/p PCI/DES, 99% mRCA s/p PCI/DES, EF 35%; c. nuclear stress test 05/13: prior ant/inf infarcts w/o ischemia, EF 43%; d. LHC 02/14: widely patent stents with no other obs dz, EF 40%; e. LHC 11/17: LM nl, mLAD 10%, patent LAD stent, dLAD 20%, p-mRCA 10%, mRCA 40%, patent RCA stent, EF 35-45%   . Depression   . GERD (gastroesophageal reflux disease)   . Headache(784.0)   . Helicobacter pylori (H. pylori)   . Hemorrhoids   . Hernia    inguinal  . HFrEF (heart failure with reduced ejection fraction) ([place])    a. 10/2016 LV gram: EF 35-45%; b. 06/2018 Echo: EF 40-45%. c. 01/2021 Echo: EF 40-45%  . Hyperlipidemia   . Hypertension   . Insomnia   . Ischemic cardiomyopathy    a. 10/2016 LV gram: EF 35-45%; b. 06/2018 Echo: EF 40-45%.  . Myalgia   . Peptic  ulcer   . Pulmonary nodules   . ST elevation (STEMI) myocardial infarction involving left anterior descending coronary artery ([place]) 11/2009   a. s/p PCI to the LAD  . Status post dilation of esophageal narrowing 2000  . Tinea pedis   . Wears dentures    partial upper    Patient Active Problem List   Diagnosis Date Noted  . Prostate cancer ([place]) 10/11/2020  . Elevated PSA 07/28/2020  . Iron deficiency anemia 05/20/2020  . Goals of care, counseling/discussion   . Palliative care by specialist   . Healthcare-associated pneumonia 01/28/2019  . COPD with acute exacerbation ([place]) 12/31/2018  . Generalized anxiety disorder 11/12/2018  . Moderate episode of recurrent major depressive disorder ([place]) 11/12/2018  . Acute respiratory failure with hypoxia ([place]) 09/29/2018  . Acute respiratory failure with hypoxia and hypercapnia ([place]) 09/13/2018  . Acute on chronic respiratory failure with hypoxia and hypercapnia ([place]) 08/24/2018  . Chest pain 07/02/2018  . Grief reaction 01/13/2018  . COPD with hypoxia ([place]) 12/25/2017  . Unstable angina ([place])   . Benign neoplasm of ascending colon   . Benign neoplasm of descending colon   . Benign neoplasm of sigmoid colon   . History of colonic polyps   .  Pulmonary nodules/lesions, multiple 01/05/2015  . Bruit of left carotid artery 11/04/2014  . Sleep disorder 07/18/2013  . Seasonal and perennial allergic rhinitis 05/29/2013  . Bradycardia 04/20/2011  . SMOKER 07/30/2010  . CAD, NATIVE VESSEL 04/23/2010  . Hyperlipidemia 03/24/2010  . DEPRESSION/ANXIETY 03/24/2010  . Chronic systolic heart failure ([place]) 03/24/2010  . COPD, very severe ([place]) 03/24/2010  . GERD 03/24/2010  . Essential hypertension 11/21/2009    Past Surgical History:  Procedure Laterality Date  . Admission  12/20/2012   COPD exacerbation.  [place].  [name] CARDIAC CATHETERIZATION    . CARDIAC CATHETERIZATION  02/05/13   [facility]  . CARDIAC CATHETERIZATION  10/14   [facility] : patent stents  with no change in anatomy. EF: 40$  . CARDIAC CATHETERIZATION Left 11/12/2016   Procedure: Left Heart Cath and Coronary Angiography;  Surgeon: Wellington Hampshire, MD;  Location: [place] [facility];  Service: Cardiovascular;  Laterality: Left;  . COLONOSCOPY    . COLONOSCOPY WITH PROPOFOL N/A 05/18/2016   Procedure: COLONOSCOPY WITH PROPOFOL;  Surgeon: Lucilla Lame, MD;  Location: [facility] ENDOSCOPY;  Service: Endoscopy;  Laterality: N/A;  . COLONOSCOPY WITH PROPOFOL N/A 03/19/2020   Procedure: COLONOSCOPY WITH PROPOFOL;  Surgeon: Lin Landsman, MD;  Location: [facility] ENDOSCOPY;  Service: Gastroenterology;  Laterality: N/A;  . CORONARY ANGIOPLASTY WITH STENT PLACEMENT  09/2009   LAD 3.0 X23 mm Xience DES, RCA: 4.0 X 15 mm Xience DES  . ELECTROMAGNETIC NAVIGATION BROCHOSCOPY N/A 10/27/2017   Procedure: ELECTROMAGNETIC NAVIGATION BRONCHOSCOPY;  Surgeon: Flora Lipps, MD;  Location: [facility] ORS;  Service: Cardiopulmonary;  Laterality: N/A;  . ESOPHAGEAL DILATION    . ESOPHAGOGASTRODUODENOSCOPY  2008  . ESOPHAGOGASTRODUODENOSCOPY  08/20/2012  . HERNIA REPAIR  07/20/2012   L inguinal hernia repair  . PENILE PROSTHESIS IMPLANT         Home Medications    Prior to Admission medications   Medication Sig Start Date End Date Taking? Authorizing Provider  aspirin 81 MG tablet Take 81 mg by mouth daily.   Yes [provider]  atorvastatin (LIPITOR) 80 MG tablet Take 1 tablet (80 mg total) by mouth daily. 03/18/21 06/16/21 Yes Dunn, Areta Haber, PA-C  escitalopram (LEXAPRO) 20 MG tablet Take 2 tablets by mouth every morning. 04/28/21  Yes [provider]  esomeprazole (NEXIUM) 40 MG capsule Take 1 capsule (40 mg total) by mouth daily. 03/18/21 08/15/21 Yes [name], MD  Fluticasone-Salmeterol (ADVAIR) 500-50 MCG/DOSE AEPB Inhale 1 puff into the lungs 2 (two) times daily. 03/18/21  Yes [name], MD  furosemide (LASIX) 40 MG tablet Take 1 tablet (40 mg total) by mouth daily. 02/06/21  02/01/22 Yes Theora Gianotti, NP  ipratropium-albuterol (DUONEB) 0.5-2.5 (3) MG/3ML SOLN USE 1 VIAL VIA NEBULIZER AND INHALE BY MOUTH 4 TIMES A DAY 02/20/21  Yes [name], MD  LORazepam (ATIVAN) 1 MG tablet Take 1 tablet (1 mg total) by mouth every 4 (four) hours while awake. 02/14/19  Yes [name], MD  losartan (COZAAR) 25 MG tablet Take 25 mg by mouth daily. 02/23/19  Yes [provider]  mirtazapine (REMERON) 15 MG tablet Take 15 mg by mouth at bedtime. 06/02/20  Yes [provider]  Multiple Vitamin (MULTIVITAMIN) capsule Take 1 capsule by mouth daily.   Yes [provider]  nitroGLYCERIN (NITROSTAT) 0.4 MG SL tablet Place 1 tablet (0.4 mg total) under the tongue every 5 (five) minutes as needed. 06/04/20  Yes Loel Dubonnet, NP  OLANZapine (ZYPREXA)  2.5 MG tablet Take 2.5 mg by mouth at bedtime.   Yes [provider]  OXYGEN Inhale 3 L into the lungs daily.    Yes [provider]  OXYGEN Inhale 2.5 L into the lungs daily.   Yes [provider]  predniSONE (DELTASONE) 50 MG tablet Take 1 tablet daily by mouth for 5 days. 05/04/21  Yes Margarette Canada, NP  Spacer/Aero-Holding Chambers (AEROCHAMBER MV) inhaler Use as instructed 05/04/21  Yes Margarette Canada, NP  sucralfate (CARAFATE) 1 g tablet Take 1 g by mouth 4 (four) times daily. 06/03/20  Yes [provider]  tiotropium (SPIRIVA) 18 MCG inhalation capsule Place 1 capsule (18 mcg total) into inhaler and inhale daily. 03/18/21  Yes [name], MD  albuterol (VENTOLIN HFA) 108 (90 Base) MCG/ACT inhaler Inhale 2 puffs into the lungs every 6 (six) hours as needed for wheezing or shortness of breath. 05/04/21   Margarette Canada, NP    Family History Family History  Problem Relation Age of Onset  . Heart attack Brother        Brother #1  . Diabetes Brother   . Hypertension Brother        #3  . Coronary artery disease Father 14       deceased  . Heart attack  Father   . Diabetes Father   . Heart disease Father   . COPD Mother 53       deceased  . Alcohol abuse Sister        polysubstance abuse  . COPD Sister   . Lung cancer Sister   . Alcohol abuse Sister        polysubstance abuse  . Penile cancer Brother   . Diabetes Brother   . Prostate cancer Neg Hx   . Bladder Cancer Neg Hx   . Kidney cancer Neg Hx     Social History Social History   Tobacco Use  . Smoking status: Former Smoker    Packs/day: 3.00    Years: 42.00    Pack years: 126.00    Types: Cigarettes    Quit date: 02/10/2019    Years since quitting: 2.2  . Smokeless tobacco: Never Used  . Tobacco comment: 25 months ago most smoked 4 packs a day .  Vaping Use  . Vaping Use: Never used  Substance Use Topics  . Alcohol use: No    Alcohol/week: 0.0 standard drinks  . Drug use: No     Allergies   Prozac [fluoxetine hcl], Effexor xr [venlafaxine hcl er], and Wellbutrin [bupropion]   Review of Systems Review of Systems  Constitutional: Negative for activity change, appetite change and fever.  HENT: Negative for congestion, ear pain and rhinorrhea.   Respiratory: Positive for shortness of breath and wheezing. Negative for cough.   Skin: Negative for rash.  Hematological: Negative.   Psychiatric/Behavioral: Negative.      Physical Exam Triage Vital Signs ED Triage Vitals  Enc Vitals Group     BP 05/04/21 1706 129/76     Pulse Rate 05/04/21 1706 86     Resp 05/04/21 1706 18     Temp 05/04/21 1706 98.7 F (37.1 C)     Temp Source 05/04/21 1706 Oral     SpO2 05/04/21 1706 100 %     Weight 05/04/21 1702 193 lb (87.5 kg)     Height 05/04/21 1702 5\' 10"  (1.778 m)     Head Circumference --      Peak Flow --  Pain Score 05/04/21 1702 0     Pain Loc --      Pain Edu? --      Excl. in [place]? --    No data found.  Updated Vital Signs BP 129/76 (BP Location: Left Arm)   Pulse 86   Temp 98.7 F (37.1 C) (Oral)   Resp 18   Ht 5\' 10"  (1.778 m)   Wt 193  lb (87.5 kg)   SpO2 100%   BMI 27.69 kg/m   Visual Acuity Right Eye Distance:   Left Eye Distance:   Bilateral Distance:    Right Eye Near:   Left Eye Near:    Bilateral Near:     Physical Exam Vitals and nursing note reviewed.  Constitutional:      General: He is not in acute distress.    Appearance: Normal appearance. He is normal weight.  HENT:     Head: Normocephalic and atraumatic.  Cardiovascular:     Rate and Rhythm: Normal rate and regular rhythm.     Pulses: Normal pulses.     Heart sounds: Normal heart sounds. No murmur heard. No gallop.   Pulmonary:     Effort: Pulmonary effort is normal.     Breath sounds: Wheezing present. No rhonchi or rales.  Skin:    General: Skin is warm and dry.     Capillary Refill: Capillary refill takes less than 2 seconds.     Findings: No erythema or rash.  Neurological:     General: No focal deficit present.     Mental Status: He is alert and oriented to person, place, and time.  Psychiatric:        Mood and Affect: Mood normal.        Behavior: Behavior normal.        Thought Content: Thought content normal.        Judgment: Judgment normal.      UC Treatments / Results  Labs (all labs ordered are listed, but only abnormal results are displayed) Labs Reviewed  SARS CORONAVIRUS 2 (TAT 6-24 HRS)    EKG   Radiology No results found.  Procedures Procedures (including critical care time)  Medications Ordered in UC Medications - No data to display  Initial Impression / Assessment and Plan / UC Course  I have reviewed the triage vital signs and the nursing notes.  Pertinent labs & imaging results that were available during my care of the patient were reviewed by me and considered in my medical decision making (see chart for details).   Patient is a very pleasant 70 year old male here for evaluation of possible COPD exacerbation due to the fact that he has had increased wheezing and shortness of breath over the  past several days.  He states he also was be checked for COVID because one of his friends that he PALS around with recently tested positive for COVID.  Patient denies any upper respiratory symptoms or GI symptoms.  He denies cough.  He has shortness of breath and wheezing.  He does use an albuterol nebulizer at home but reports that the nebulizer does not make a significant amount of missed when he takes his treatments.  Physical exam reveals a nontoxic, nondyspneic patient who is on 4 L O2 via nasal cannula.  Heart sounds are S1-S2 and lung sounds are decreased in bilateral middle and lower lobes with expiratory wheezes present in the upper lobes.  Will obtain chest x-ray to look for acute intrathoracic process.  Chest x-ray reviewed and independently evaluated by me.  Interpretation: No evidence of pneumonia.  Awaiting Radiology overread.  We will discharge patient home with a diagnosis of COPD exacerbation with prednisone 50 mg daily for 5 days.  Patient is prescribed an albuterol inhaler and I will prescribe a spacer for him to use until he can get a new nebulizer.  Final Clinical Impressions(s) / UC Diagnoses   Final diagnoses:  COPD exacerbation ([place])     Discharge Instructions     Until you can get a new nebulizer machine I want you to use the albuterol inhaler with the spacer, 2 puffs every 4-6 hours, as needed for shortness of breath and wheezing.  Starting tomorrow morning take the prednisone 50 mg once daily with food for total of 5 days to help decrease the inflammation in your chest and help you with your breathing.  If you develop any new or worsening symptoms return for reevaluation or see your primary care provider.    ED Prescriptions    Medication Sig Dispense Auth. Provider   albuterol (VENTOLIN HFA) 108 (90 Base) MCG/ACT inhaler Inhale 2 puffs into the lungs every 6 (six) hours as needed for wheezing or shortness of breath. 18 g Margarette Canada, NP   Spacer/Aero-Holding  Chambers (AEROCHAMBER MV) inhaler Use as instructed 1 each Margarette Canada, NP   predniSONE (DELTASONE) 50 MG tablet Take 1 tablet daily by mouth for 5 days. 5 tablet Margarette Canada, NP     PDMP not reviewed this encounter.   Margarette Canada, NP 05/04/21 1842

## 2021-05-04 NOTE — ED Triage Notes (Signed)
Pt c/o possible COPD exacerbation for quite some time. Pt reports he has been using a nebulizer but it does not seem to help. Pt is on 2.5-4L O2 24/7. Pt denies f/n/v/d or other symptoms.

## 2021-05-04 NOTE — Discharge Instructions (Addendum)
Until you can get a new nebulizer machine I want you to use the albuterol inhaler with the spacer, 2 puffs every 4-6 hours, as needed for shortness of breath and wheezing.  Starting tomorrow morning take the prednisone 50 mg once daily with food for total of 5 days to help decrease the inflammation in your chest and help you with your breathing.  If you develop any new or worsening symptoms return for reevaluation or see your primary care provider.

## 2021-05-05 LAB — SARS CORONAVIRUS 2 (TAT 6-24 HRS): SARS Coronavirus 2: NEGATIVE

## 2021-05-07 ENCOUNTER — Emergency Department
Admission: EM | Admit: 2021-05-07 | Discharge: 2021-05-07 | Disposition: A | Discharge status: Home / Self Care | Payer: Medicare Other | Attending: Emergency Medicine | Admitting: Emergency Medicine

## 2021-05-07 ENCOUNTER — Other Ambulatory Visit: Payer: Self-pay

## 2021-05-07 ENCOUNTER — Encounter: Payer: Self-pay | Admitting: Emergency Medicine

## 2021-05-07 DIAGNOSIS — Z7982 Long term (current) use of aspirin: Secondary | ICD-10-CM | POA: Diagnosis not present

## 2021-05-07 DIAGNOSIS — I2510 Atherosclerotic heart disease of native coronary artery without angina pectoris: Secondary | ICD-10-CM | POA: Diagnosis not present

## 2021-05-07 DIAGNOSIS — J441 Chronic obstructive pulmonary disease with (acute) exacerbation: Secondary | ICD-10-CM | POA: Diagnosis not present

## 2021-05-07 DIAGNOSIS — Z87891 Personal history of nicotine dependence: Secondary | ICD-10-CM | POA: Diagnosis not present

## 2021-05-07 DIAGNOSIS — R0602 Shortness of breath: Secondary | ICD-10-CM | POA: Diagnosis not present

## 2021-05-07 DIAGNOSIS — Z85038 Personal history of other malignant neoplasm of large intestine: Secondary | ICD-10-CM | POA: Insufficient documentation

## 2021-05-07 DIAGNOSIS — I5022 Chronic systolic (congestive) heart failure: Secondary | ICD-10-CM | POA: Diagnosis not present

## 2021-05-07 DIAGNOSIS — I110 Hypertensive heart disease with heart failure: Secondary | ICD-10-CM | POA: Insufficient documentation

## 2021-05-07 DIAGNOSIS — Z7951 Long term (current) use of inhaled steroids: Secondary | ICD-10-CM | POA: Diagnosis not present

## 2021-05-07 DIAGNOSIS — Z8546 Personal history of malignant neoplasm of prostate: Secondary | ICD-10-CM | POA: Insufficient documentation

## 2021-05-07 LAB — CBC
HCT: 39.6 % (ref 39.0–52.0)
Hemoglobin: 12.8 g/dL — ABNORMAL LOW (ref 13.0–17.0)
MCH: 29.3 pg (ref 26.0–34.0)
MCHC: 32.3 g/dL (ref 30.0–36.0)
MCV: 90.6 fL (ref 80.0–100.0)
Platelets: 254 10*3/uL (ref 150–400)
RBC: 4.37 MIL/uL (ref 4.22–5.81)
RDW: 12.3 % (ref 11.5–15.5)
WBC: 8.8 10*3/uL (ref 4.0–10.5)
nRBC: 0 % (ref 0.0–0.2)

## 2021-05-07 LAB — COMPREHENSIVE METABOLIC PANEL
ALT: 32 U/L (ref 0–44)
AST: 26 U/L (ref 15–41)
Albumin: 4.3 g/dL (ref 3.5–5.0)
Alkaline Phosphatase: 77 U/L (ref 38–126)
Anion gap: 12 (ref 5–15)
BUN: 24 mg/dL — ABNORMAL HIGH (ref 8–23)
CO2: 30 mmol/L (ref 22–32)
Calcium: 9.7 mg/dL (ref 8.9–10.3)
Chloride: 98 mmol/L (ref 98–111)
Creatinine, Ser: 0.92 mg/dL (ref 0.61–1.24)
GFR, Estimated: >60 mL/min (ref >60)
Glucose, Bld: 159 mg/dL — ABNORMAL HIGH (ref 70–99)
Potassium: 3.8 mmol/L (ref 3.5–5.1)
Sodium: 140 mmol/L (ref 135–145)
Total Bilirubin: 0.6 mg/dL (ref 0.3–1.2)
Total Protein: 7.4 g/dL (ref 6.5–8.1)

## 2021-05-07 MED ORDER — ACETYLCYSTEINE 20 % IN SOLN: 4.0000 mL | Freq: Three times a day (TID) | RESPIRATORY_TRACT | 1 refills | Status: DC | PRN

## 2021-05-07 MED ORDER — ACETYLCYSTEINE 20 % IN SOLN
4.0000 mL | Freq: Once | RESPIRATORY_TRACT | Status: AC
  Administered 2021-05-07: 4 mL via RESPIRATORY_TRACT
  Filled 2021-05-07: qty 4

## 2021-05-07 NOTE — ED Notes (Signed)
Pt given crackers and soda. 

## 2021-05-07 NOTE — ED Triage Notes (Signed)
[facility] [facility]:  C/O  SOB 2 week, worse in the last week.  3 days into prednisone.  125 mg solumedrol given.  22 rAC.  Has had 5 albuterol treatments today.  Home oxygen 2.5l/ [place].

## 2021-05-07 NOTE — ED Triage Notes (Signed)
Pt reports is on home O2, 2L at home but when he goes out and puts a mask on he is on 3-4L.

## 2021-05-07 NOTE — ED Notes (Signed)
ED Provider at bedside. 

## 2021-05-07 NOTE — ED Provider Notes (Signed)
[facility] Emergency Department Provider Note   ____________________________________________   I have reviewed the triage vital signs and the nursing notes.   HISTORY  Chief Complaint Shortness of Breath   History limited by: Not Limited   HPI Leslie Sims is a 70 y.o. male who presents to the emergency department today because of concern for continued and worsening shortness of breath. The patient states that he has been short of breath over the past 2 weeks. Over the past few days it has gotten worse. Went to urgent care three days ago where he was given a prescription for steroids. The patient has been taking those and has also been using his breathing treatments at home without any significant relief. The patient has had some discomfort in the center part of his chest. Additionally the patient has had a cough productive of some phlegm. Denies any fevers.    Records reviewed. Per medical record review patient has a history of UC visit three days ago for similar symptoms. Negative work up at that time.   Past Medical History:  Diagnosis Date  . Allergic rhinitis   . Anxiety   . Back pain   . Cancer [facility])    prostate  . Colon polyps 08/2012   colonoscopy; multiple colon polyps; repeat colonoscopy in one year.  [name] COPD (chronic obstructive pulmonary disease) ([place])    a. on home O2.  . Coronary artery disease 11/2009   a. late presenting ant MI; b. LHC 100% pLAD s/p PCI/DES, 99% mRCA s/p PCI/DES, EF 35%; c. nuclear stress test 05/13: prior ant/inf infarcts w/o ischemia, EF 43%; d. LHC 02/14: widely patent stents with no other obs dz, EF 40%; e. LHC 11/17: LM nl, mLAD 10%, patent LAD stent, dLAD 20%, p-mRCA 10%, mRCA 40%, patent RCA stent, EF 35-45%   . Depression   . GERD (gastroesophageal reflux disease)   . Headache(784.0)   . Helicobacter pylori (H. pylori)   . Hemorrhoids   . Hernia    inguinal  . HFrEF (heart failure with reduced ejection  fraction) ([place])    a. 10/2016 LV gram: EF 35-45%; b. 06/2018 Echo: EF 40-45%. c. 01/2021 Echo: EF 40-45%  . Hyperlipidemia   . Hypertension   . Insomnia   . Ischemic cardiomyopathy    a. 10/2016 LV gram: EF 35-45%; b. 06/2018 Echo: EF 40-45%.  . Myalgia   . Peptic ulcer   . Pulmonary nodules   . ST elevation (STEMI) myocardial infarction involving left anterior descending coronary artery ([place]) 11/2009   a. s/p PCI to the LAD  . Status post dilation of esophageal narrowing 2000  . Tinea pedis   . Wears dentures    partial upper    Patient Active Problem List   Diagnosis Date Noted  . Prostate cancer ([place]) 10/11/2020  . Elevated PSA 07/28/2020  . Iron deficiency anemia 05/20/2020  . Goals of care, counseling/discussion   . Palliative care by specialist   . Healthcare-associated pneumonia 01/28/2019  . COPD with acute exacerbation ([place]) 12/31/2018  . Generalized anxiety disorder 11/12/2018  . Moderate episode of recurrent major depressive disorder ([place]) 11/12/2018  . Acute respiratory failure with hypoxia ([place]) 09/29/2018  . Acute respiratory failure with hypoxia and hypercapnia ([place]) 09/13/2018  . Acute on chronic respiratory failure with hypoxia and hypercapnia ([place]) 08/24/2018  . Chest pain 07/02/2018  . Grief reaction 01/13/2018  . COPD with hypoxia ([place]) 12/25/2017  . Unstable angina ([place])   . Benign  neoplasm of ascending colon   . Benign neoplasm of descending colon   . Benign neoplasm of sigmoid colon   . History of colonic polyps   . Pulmonary nodules/lesions, multiple 01/05/2015  . Bruit of left carotid artery 11/04/2014  . Sleep disorder 07/18/2013  . Seasonal and perennial allergic rhinitis 05/29/2013  . Bradycardia 04/20/2011  . SMOKER 07/30/2010  . CAD, NATIVE VESSEL 04/23/2010  . Hyperlipidemia 03/24/2010  . DEPRESSION/ANXIETY 03/24/2010  . Chronic systolic heart failure ([place]) 03/24/2010  . COPD, very severe ([place]) 03/24/2010  . GERD 03/24/2010  . Essential  hypertension 11/21/2009    Past Surgical History:  Procedure Laterality Date  . Admission  12/20/2012   COPD exacerbation.  [place].  [name] CARDIAC CATHETERIZATION    . CARDIAC CATHETERIZATION  02/05/13   [facility]  . CARDIAC CATHETERIZATION  10/14   [facility] : patent stents with no change in anatomy. EF: 40$  . CARDIAC CATHETERIZATION Left 11/12/2016   Procedure: Left Heart Cath and Coronary Angiography;  Surgeon: Wellington Hampshire, MD;  Location: [facility];  Service: Cardiovascular;  Laterality: Left;  . COLONOSCOPY    . COLONOSCOPY WITH PROPOFOL N/A 05/18/2016   Procedure: COLONOSCOPY WITH PROPOFOL;  Surgeon: Lucilla Lame, MD;  Location: [facility] ENDOSCOPY;  Service: Endoscopy;  Laterality: N/A;  . COLONOSCOPY WITH PROPOFOL N/A 03/19/2020   Procedure: COLONOSCOPY WITH PROPOFOL;  Surgeon: Lin Landsman, MD;  Location: [facility] ENDOSCOPY;  Service: Gastroenterology;  Laterality: N/A;  . CORONARY ANGIOPLASTY WITH STENT PLACEMENT  09/2009   LAD 3.0 X23 mm Xience DES, RCA: 4.0 X 15 mm Xience DES  . ELECTROMAGNETIC NAVIGATION BROCHOSCOPY N/A 10/27/2017   Procedure: ELECTROMAGNETIC NAVIGATION BRONCHOSCOPY;  Surgeon: Flora Lipps, MD;  Location: [facility] ORS;  Service: Cardiopulmonary;  Laterality: N/A;  . ESOPHAGEAL DILATION    . ESOPHAGOGASTRODUODENOSCOPY  2008  . ESOPHAGOGASTRODUODENOSCOPY  08/20/2012  . HERNIA REPAIR  07/20/2012   L inguinal hernia repair  . PENILE PROSTHESIS IMPLANT      Prior to Admission medications   Medication Sig Start Date End Date Taking? Authorizing Provider  albuterol (VENTOLIN HFA) 108 (90 Base) MCG/ACT inhaler Inhale 2 puffs into the lungs every 6 (six) hours as needed for wheezing or shortness of breath. 05/04/21   Margarette Canada, NP  aspirin 81 MG tablet Take 81 mg by mouth daily.    [provider]  atorvastatin (LIPITOR) 80 MG tablet Take 1 tablet (80 mg total) by mouth daily. 03/18/21 06/16/21  Rise Mu, PA-C  escitalopram (LEXAPRO) 20 MG tablet Take 2 tablets by  mouth every morning. 04/28/21   [provider]  esomeprazole (NEXIUM) 40 MG capsule Take 1 capsule (40 mg total) by mouth daily. 03/18/21 08/15/21  [name], MD  Fluticasone-Salmeterol (ADVAIR) 500-50 MCG/DOSE AEPB Inhale 1 puff into the lungs 2 (two) times daily. 03/18/21   [name], MD  furosemide (LASIX) 40 MG tablet Take 1 tablet (40 mg total) by mouth daily. 02/06/21 02/01/22  Theora Gianotti, NP  ipratropium-albuterol (DUONEB) 0.5-2.5 (3) MG/3ML SOLN USE 1 VIAL VIA NEBULIZER AND INHALE BY MOUTH 4 TIMES A DAY 02/20/21   [name], MD  LORazepam (ATIVAN) 1 MG tablet Take 1 tablet (1 mg total) by mouth every 4 (four) hours while awake. 02/14/19   [name], MD  losartan (COZAAR) 25 MG tablet Take 25 mg by mouth daily. 02/23/19   [provider]  mirtazapine (REMERON) 15 MG tablet Take 15 mg by mouth at bedtime.  06/02/20   [provider]  Multiple Vitamin (MULTIVITAMIN) capsule Take 1 capsule by mouth daily.    [provider]  nitroGLYCERIN (NITROSTAT) 0.4 MG SL tablet Place 1 tablet (0.4 mg total) under the tongue every 5 (five) minutes as needed. 06/04/20   Loel Dubonnet, NP  OLANZapine (ZYPREXA) 2.5 MG tablet Take 2.5 mg by mouth at bedtime.    [provider]  OXYGEN Inhale 3 L into the lungs daily.     [provider]  OXYGEN Inhale 2.5 L into the lungs daily.    [provider]  predniSONE (DELTASONE) 50 MG tablet Take 1 tablet daily by mouth for 5 days. 05/04/21   Margarette Canada, NP  Spacer/Aero-Holding Chambers (AEROCHAMBER MV) inhaler Use as instructed 05/04/21   Margarette Canada, NP  sucralfate (CARAFATE) 1 g tablet Take 1 g by mouth 4 (four) times daily. 06/03/20   [provider]  tiotropium (SPIRIVA) 18 MCG inhalation capsule Place 1 capsule (18 mcg total) into inhaler and inhale daily. 03/18/21   [name], MD    Allergies Prozac [fluoxetine hcl], Effexor xr [venlafaxine  hcl er], and Wellbutrin [bupropion]  Family History  Problem Relation Age of Onset  . Heart attack Brother        Brother #1  . Diabetes Brother   . Hypertension Brother        #3  . Coronary artery disease Father 42       deceased  . Heart attack Father   . Diabetes Father   . Heart disease Father   . COPD Mother 90       deceased  . Alcohol abuse Sister        polysubstance abuse  . COPD Sister   . Lung cancer Sister   . Alcohol abuse Sister        polysubstance abuse  . Penile cancer Brother   . Diabetes Brother   . Prostate cancer Neg Hx   . Bladder Cancer Neg Hx   . Kidney cancer Neg Hx     Social History Social History   Tobacco Use  . Smoking status: Former Smoker    Packs/day: 3.00    Years: 42.00    Pack years: 126.00    Types: Cigarettes    Quit date: 02/10/2019    Years since quitting: 2.2  . Smokeless tobacco: Never Used  . Tobacco comment: 25 months ago most smoked 4 packs a day .  Vaping Use  . Vaping Use: Never used  Substance Use Topics  . Alcohol use: No    Alcohol/week: 0.0 standard drinks  . Drug use: No    Review of Systems Constitutional: No fever/chills Eyes: No visual changes. ENT: No sore throat. Cardiovascular: Positive for chest discomfort.  Respiratory: Positive for shortness of breath. Gastrointestinal: No abdominal pain.  No nausea, no vomiting.  No diarrhea.   Genitourinary: Negative for dysuria. Musculoskeletal: Negative for back pain. Skin: Negative for rash. Neurological: Negative for headaches, focal weakness or numbness.  ____________________________________________   PHYSICAL EXAM:  VITAL SIGNS: ED Triage Vitals  Enc Vitals Group     BP 05/07/21 1520 (!) 145/71     Pulse Rate 05/07/21 1520 91     Resp 05/07/21 1520 (!) 21     Temp 05/07/21 1517 98.1 F (36.7 C)     Temp Source 05/07/21 1517 Oral     SpO2 05/07/21 1520 94 %     Weight 05/07/21 1517 192 lb (  87.1 kg)     Height 05/07/21 1517 5\' 10"  (1.778 m)      Head Circumference --      Peak Flow --      Pain Score 05/07/21 1517 0   Constitutional: Alert and oriented.  Eyes: Conjunctivae are normal.  ENT      Head: Normocephalic and atraumatic.      Nose: No congestion/rhinnorhea.      Mouth/Throat: Mucous membranes are moist.      Neck: No stridor. Hematological/Lymphatic/Immunilogical: No cervical lymphadenopathy. Cardiovascular: Normal rate, regular rhythm.  No murmurs, rubs, or gallops.  Respiratory: Increased work of breathing. Diffuse expiratory wheezing. Poor air movement diffusely.  Gastrointestinal: Soft and non tender. No rebound. No guarding.  Genitourinary: Deferred Musculoskeletal: Normal range of motion in all extremities. No lower extremity edema. Neurologic:  Normal speech and language. No gross focal neurologic deficits are appreciated.  Skin:  Skin is warm, dry and intact. No rash noted. Psychiatric: Mood and affect are normal. Speech and behavior are normal. Patient exhibits appropriate insight and judgment.  ____________________________________________    LABS (pertinent positives/negatives)  CBC wbc 8.8, hgb 12.8, plt 254 CMP wnl except glu 159, bun 24  ____________________________________________   EKG  I, Nance Pear, attending physician, personally viewed and interpreted this EKG  EKG Time: 1520 Rate: 90 Rhythm: normal sinus rhythm Axis: left axis deviation Intervals: qtc 477 QRS: IVCD ST changes: no st elevation Impression: abnormal ekg  ____________________________________________    RADIOLOGY  None  ____________________________________________   PROCEDURES  Procedures  ____________________________________________   INITIAL IMPRESSION / ASSESSMENT AND PLAN / ED COURSE  Pertinent labs & imaging results that were available during my care of the patient were reviewed by me and considered in my medical decision making (see chart for details).   Patient presented to the  emergency department today with continued shortness of breath. The patient had been seen at urgent care a few days ago for the same symptoms. He did describe a feeling of phlegm in his chest. Patient did feel significant improvement with his symptoms after mucomyst. Did want to be discharged home. Will plan on discharging home with prescription for mucomyst.   ____________________________________________   FINAL CLINICAL IMPRESSION(S) / ED DIAGNOSES  Final diagnoses:  Shortness of breath     Note: This dictation was prepared with Dragon dictation. Any transcriptional errors that result from this process are unintentional     Nance Pear, MD 05/07/21 2001

## 2021-05-07 NOTE — ED Triage Notes (Signed)
Pt reports had chest x-ray at UC this week

## 2021-05-07 NOTE — ED Triage Notes (Signed)
Pt reports has been wheezing and sob for awhile. Pt reports was seen at [facility] and all they did was put him on prednisone and he is no better. Pt denies pain

## 2021-05-07 NOTE — Discharge Instructions (Addendum)
Please seek medical attention for any high fevers, chest pain, shortness of breath, change in behavior, persistent vomiting, bloody stool or any other new or concerning symptoms.  

## 2021-06-30 ENCOUNTER — Other Ambulatory Visit: Payer: Self-pay

## 2021-06-30 ENCOUNTER — Encounter: Payer: Self-pay | Admitting: Pulmonary Disease

## 2021-06-30 ENCOUNTER — Ambulatory Visit (INDEPENDENT_AMBULATORY_CARE_PROVIDER_SITE_OTHER): Payer: Medicare Other | Admitting: Pulmonary Disease

## 2021-06-30 VITALS — BP 124/76 | HR 100 | Temp 97.7°F | Ht 70.0 in | Wt 197.0 lb

## 2021-06-30 DIAGNOSIS — R918 Other nonspecific abnormal finding of lung field: Secondary | ICD-10-CM

## 2021-06-30 DIAGNOSIS — J449 Chronic obstructive pulmonary disease, unspecified: Secondary | ICD-10-CM

## 2021-06-30 DIAGNOSIS — Z87891 Personal history of nicotine dependence: Secondary | ICD-10-CM

## 2021-06-30 DIAGNOSIS — J9611 Chronic respiratory failure with hypoxia: Secondary | ICD-10-CM

## 2021-06-30 DIAGNOSIS — I5042 Chronic combined systolic (congestive) and diastolic (congestive) heart failure: Secondary | ICD-10-CM | POA: Diagnosis not present

## 2021-06-30 MED ORDER — BUDESONIDE 0.5 MG/2ML IN SUSP: 0.5000 mg | Freq: Two times a day (BID) | RESPIRATORY_TRACT | 11 refills | Status: DC

## 2021-06-30 NOTE — Progress Notes (Addendum)
Subjective:    Patient ID: Leslie Sims, male    DOB: 09/14/1951, 70 y.o.   MRN: [id number] Chief Complaint  Patient presents with   Follow-up    C/o sob with exertion, mild dry cough and wheezing   Patient ID: Leslie Sims, male    DOB: 1951-03-18, 70 y.o.   MRN: [id number] Requesting MD/Service: [name], MD Date of initial consultation: 08/10/16 by Dr. [name] Reason for consultation: COPD, smoker   PT PROFILE: 70 M former smoker quit 02/10/2019 with severe emphysema seen by multiple pulmonologists in past ([place], [place], [place], [place]) referred for further eval and mgmt of COPD.  Previously seen by Dr. [name].   DATA: 07/24/12 Office Spirometry: Severe obstruction (FEV1 31% pred) 08/31/13 Office Spirometry: very severe obstruction (FEV1 25% pred) 06/18/15 CT chest: Severe emphysema 03/03/17 Overnight oximetry: Lowest SpO2 72%. 95% of time SpO2 was below 90% 03/03/17 6MWT: 384 meters. Desat to 84% 10/04/17 CT chest: Two new irregularly marginated nodules, one in the RUL, and a second in the mid LUL worrisome for either synchronous lung ca or metastatic involvement of the lungs. The two small nodules described on the prior dictated report have resolved in the left superior segment and most likely were postinflammatory. Diffuse centrilobular emphysema. 10/13/17 PET scan: The new left upper lobe nodule is FDG avid consistent with malignancy. Recommend tissue confirmation. The new nodule in the right upper lobe demonstrates low level uptake. While the level of uptake suggests the possibility of an inflammatory or infectious process, a low-grade malignancy is not excluded. Given the suspected malignancy on the left, tissue confirmation should be considered on the right despite the low level of uptake. No distant metastatic disease identified 10/27/17 ENB ([place]): Transbronchial Fine Needle Aspirations 21G X7, Transbronchial Forceps Biopsy X6. ALVEOLATED LUNG TISSUE WITH  HEMOSIDERIN LADEN MACROPHAGES.  NEGATIVE FOR ATYPIA AND MALIGNANCY 11/01/17 PFTs: FVC: 2.96 > 3.14 L (68 > 72 %pred), FEV1: 0.94 > 0.93 L (27 %pred), FEV1/FVC: 32%, TLC: 9.04 L (137 %pred), DLCO 62 %pred 12/07/17 CTA chest: No pulmonary embolus identified. Anterior left upper lobe ground-glass and nodular consolidation likely representing pneumonitis. Stable bilateral upper lobe pulmonary nodules. Stable emphysema. 01/21/18 CT chest: new masslike architectural distortion and ground-glass attenuation surrounding the index nodule within the RUL. The appearance favors an inflammatory or infectious process. The index nodule in the left upper lobe is slightly decreased in volume when compared with the previous exam favoring a benign process. Attention on follow-up imaging advise. Advanced changes of emphysema (very severe) 06/01/18 CTA chest: No evidence of pulmonary embolism. Waxing and waning bilateral pulmonary nodular process as described. New 8 mm nodular ill-defined focus of opacification over the lingula which may represent an acute inflammatory or infectious process. No effusion. Emphysema 10/19/18 CTA chest: No acute cardiopulmonary disease and no evidence of pulm embolism. Hospitalization 1/12-1/14/2020: COPD exacerbation Hospitalization 2/22-2/27/20: COPD exacerbation, HCAP 02/11/19 CT chest: Progressed masslike consolidation within the RUL in the region of previous waxing and waning nodule, this is contiguous with the pleural surface and demonstrates spiculated margin and probable small focus of central cavitation. Findings could be secondary to infection superimposed on chronic nodule. However cavitary neoplasm is also a concern. Multiple additional lung nodules which have not significantly changed. Small foci of ground-glass density in the RUL may reflect small focus of infection. Mod-severe emphysema 05/01/19 CT chest: Waxing/waning right lung opacities, suggesting infection, including  atypical/mycobacterial infection. Improvement of the prior dominant masslike opacity argues against neoplasm. Stable left upper  lobe nodule, likely benign. Aortic Atherosclerosis and Emphysema 11/02/2019 CT chest: Multiple larger pulmonary nodules and masslike areas seen on the prior study have largely regressed.  Findings indicative of infectious/inflammatory process.  Diffuse bronchial wall thickening with moderate centrilobular and paraseptal emphysema.  Suggestion of left main and three-vessel coronary artery disease. 04/23/2020 CT chest: "Multiple bilateral stable pulmonary nodules. New 7 mm nodule over the posterior left upper lobe. Stable nodularity over an area ofright upper lobe/apical scarring with new 9 mm nodular focus along the most inferior aspect of this scarring. This area has shown to wax and wane on previous exams and is most likely inflammatory". 07/24/2020 CT chest: "Background of marked pulmonary emphysema with stable post infectious or inflammatory changes and with near complete resolution of a presumably infectious or inflammatory nodule in the LEFT upper lobe as compared to previous exam". 01/21/2021 CT chest: Severe emphysematous changes, pulmonary scarring, bilateral calcified granulomas. 01/29/2021 PFTs: Further decline in lung function.  FEV1 0.74 L or 22% predicted, FVC 1.91 L or 42% predicted.  No significant bronchodilator response.  FEV1/FVC 39%.  Patient could not perform lung volumes.  Diffusion capacity severely reduced.  This is consistent with very severe COPD. 02/13/2021 2D echo: LVEF 40 to 45% global hypokinesis, severe hypokinesis of the periapical region.  Grade II diastolic dysfunction, dilated left atrium.   INTERVAL: Last seen by  me on 03/18/2021.   multiple visits to ED due to congestive heart failure, chest pain and 2 recent COPD exacerbations ED visits in May. Remains abstinent of tobacco.   HPI Leslie Sims is a 70 year old former smoker (quit 2020) who follows  here for the issue of very severe COPD and chronic respiratory failure with hypoxia.  Patient is well compensated on 2 L/min nasal cannula O2 with regards to his respiratory failure.  Since his last visit in January he has had several exacerbations of COPD that required emergency room visits.  He remains abstinent of tobacco.  He is currently on Advair 500/50 and Spiriva HandiHaler.  He states that his reflux symptoms are better controlled on Nexium.  He feels he has tenacious secretions.  He was given DuoNeb on one of his exacerbations and he is also on Spiriva.  This is a duplication of SAMA/LAMA and likely causing secretions to become more tenacious.  He also feels that inhalers do not get deep into his lungs.  This is not surprising giving that his FEV1 has declined and is now only at 22%.  He does not have the breath-holding capacity to use inhalers.  He does feel better when he uses his nebulizer.  He is using the DuoNeb 4 times a day. Dyspnea is otherwise about baseline.  He feels he has "good days and bad days ".    Patient is also been followed for issues with multiple pulmonary nodules.  Most recent CT scan of the chest performed in February showed that these are calcified granulomas.  He is not a candidate for invasive procedures due to the very severe nature of his COPD.   Review of Systems A 10 point review of systems was performed and it is as noted above otherwise negative.  Patient Active Problem List   Diagnosis Date Noted   Prostate cancer ([place]) 10/11/2020   Elevated PSA 07/28/2020   Iron deficiency anemia 05/20/2020   Goals of care, counseling/discussion    Palliative care by specialist    Healthcare-associated pneumonia 01/28/2019   COPD with acute exacerbation ([place]) 12/31/2018   Generalized anxiety  disorder 11/12/2018   Moderate episode of recurrent major depressive disorder ([place]) 11/12/2018   Acute respiratory failure with hypoxia ([place]) 09/29/2018   Acute respiratory failure  with hypoxia and hypercapnia (H[facility]) 09/13/2018   Acute on chronic respiratory failure with hypoxia and hypercapnia (HCC) 08/24/2018   Chest pain 07/02/2018   Grief reaction 01/13/2018   COPD with hypoxia ([place]) 12/25/2017   Unstable angina ([facility])    Benign neoplasm of ascending colon    Benign neoplasm of descending colon    Benign neoplasm of sigmoid colon    History of colonic polyps    Pulmonary nodules/lesions, multiple 01/05/2015   Bruit of left carotid artery 11/04/2014   Sleep disorder 07/18/2013   Seasonal and perennial allergic rhinitis 05/29/2013   Bradycardia 04/20/2011   SMOKER 07/30/2010   CAD, NATIVE VESSEL 04/23/2010   Hyperlipidemia 03/24/2010   DEPRESSION/ANXIETY 99/83/3825   Chronic systolic heart failure ([place]) 03/24/2010   COPD, very severe ([place]) 03/24/2010   GERD 03/24/2010   Essential hypertension 11/21/2009   Social History   Tobacco Use   Smoking status: Former    Packs/day: 3.00    Years: 42.00    Pack years: 126.00    Types: Cigarettes    Quit date: 02/10/2019    Years since quitting: 2.3   Smokeless tobacco: Never   Tobacco comments:    25 months ago most smoked 4 packs a day .  Substance Use Topics   Alcohol use: No    Alcohol/week: 0.0 standard drinks   Allergies  Allergen Reactions   Prozac [Fluoxetine Hcl] Shortness Of Breath   Effexor Xr [Venlafaxine Hcl Er] Other (See Comments)    "Makes me feel funny"   Wellbutrin [Bupropion] Other (See Comments)    "Makes me feel funny"   Current Meds  Medication Sig   albuterol (VENTOLIN HFA) 108 (90 Base) MCG/ACT inhaler Inhale 2 puffs into the lungs every 6 (six) hours as needed for wheezing or shortness of breath.   aspirin 81 MG tablet Take 81 mg by mouth daily.   escitalopram (LEXAPRO) 20 MG tablet Take 2 tablets by mouth every morning.   esomeprazole (NEXIUM) 40 MG capsule Take 1 capsule (40 mg total) by mouth daily.   fluticasone (FLONASE) 50 MCG/ACT nasal spray Place 2 sprays into both  nostrils daily.   Fluticasone-Salmeterol (ADVAIR) 500-50 MCG/DOSE AEPB Inhale 1 puff into the lungs 2 (two) times daily.   furosemide (LASIX) 40 MG tablet Take 1 tablet (40 mg total) by mouth daily.   ipratropium-albuterol (DUONEB) 0.5-2.5 (3) MG/3ML SOLN USE 1 VIAL VIA NEBULIZER AND INHALE BY MOUTH 4 TIMES A DAY   loratadine (CLARITIN) 10 MG tablet Take 10 mg by mouth daily.   LORazepam (ATIVAN) 1 MG tablet Take 1 tablet (1 mg total) by mouth every 4 (four) hours while awake.   losartan (COZAAR) 25 MG tablet Take 25 mg by mouth daily.   mirtazapine (REMERON) 15 MG tablet Take 15 mg by mouth at bedtime.   Multiple Vitamin (MULTIVITAMIN) capsule Take 1 capsule by mouth daily.   nitroGLYCERIN (NITROSTAT) 0.4 MG SL tablet Place 1 tablet (0.4 mg total) under the tongue every 5 (five) minutes as needed.   OLANZapine (ZYPREXA) 2.5 MG tablet Take 2.5 mg by mouth at bedtime.   OXYGEN Inhale 3 L into the lungs daily.    OXYGEN Inhale 2.5 L into the lungs daily.   Spacer/Aero-Holding Chambers (AEROCHAMBER MV) inhaler Use as instructed   sucralfate (CARAFATE) 1 g tablet Take  1 g by mouth 4 (four) times daily.   tiotropium (SPIRIVA) 18 MCG inhalation capsule Place 1 capsule (18 mcg total) into inhaler and inhale daily.   [DISCONTINUED] acetylcysteine (MUCOMYST) 20 % nebulizer solution Take 4 mLs by nebulization 3 (three) times daily as needed (Shortness of breath).   Immunization History  Administered Date(s) Administered   Influenza Whole 09/19/2012   Influenza, High Dose Seasonal PF 09/28/2019   Influenza,inj,Quad PF,6+ Mos 10/24/2013, 11/25/2014, 03/02/2016, 10/27/2016, 10/05/2017   Influenza-Unspecified 10/24/2013, 11/25/2014, 03/02/2016, 10/27/2016, 10/05/2017, 09/18/2018   Moderna Sars-Covid-2 Vaccination 01/26/2020, 02/23/2020   PFIZER(Purple Top)SARS-COV-2 Vaccination 01/26/2020, 02/23/2020, 12/15/2020   Pneumococcal Conjugate-13 11/25/2014   Pneumococcal Polysaccharide-23 08/24/2016,  10/27/2016   Pneumococcal-Unspecified 12/20/2008   Tdap 12/20/2008       Objective:   Physical Exam BP 124/76 (BP Location: Left Arm, Cuff Size: Normal)   Pulse 100   Temp 97.7 F (36.5 C) (Temporal)   Ht 5\' 10"  (1.778 m)   Wt 197 lb (89.4 kg)   SpO2 98%   BMI 28.27 kg/m  GENERAL: Well-developed well-nourished gentleman, chronic use of accessories, no conversational dyspnea. No acute distress.    Fully ambulatory. Using oxygen portable concentrator at 2 L pulsed. HEAD: Normocephalic, atraumatic. EYES: Pupils equal, round, reactive to light.  No scleral icterus. MOUTH: Nose/mouth/throat not examined due to masking requirements for COVID 19. NECK: Supple. No thyromegaly. No nodules. No JVD.  Trachea midline, no crepitus. PULMONARY: Very distant breath sounds, no adventitious sounds. CARDIOVASCULAR: S1 and S2. Regular rate and rhythm.  No rubs murmurs or gallops heard. GASTROINTESTINAL: Slightly protuberant abdomen, soft, benign. MUSCULOSKELETAL: No joint deformity, no clubbing, no edema. NEUROLOGIC: Awake, alert, oriented.  No overt focal deficits.  Speech is fluent. SKIN: Intact,warm,dry.  Limited exam: No rashes. PSYCH: Normal mood and behavior.     Assessment & Plan:     ICD-10-CM   1. Stage 4 very severe COPD by GOLD classification ([place])  J44.9    Discontinue Advair and Spiriva Continue DuoNeb 4 times a day Add Pulmicort twice a day after DuoNeb Follow-up 2 months    2. Pulmonary nodules/lesions, multiple  R91.8    These are calcified granulomas No further imaging needed for these Patient is not a candidate for invasive procedures    3. Chronic hypoxemic respiratory failure (H[facility])  J96.11    Continue oxygen at 2 L/min    4. Chronic combined systolic and diastolic CHF (congestive heart failure) (HCC)  I50.42    This issue adds complexity to his management Adds to his sensation of dyspnea Managed by cardiology    5. Former smoker  Z87.891    Has had no relapses       Meds ordered this encounter  Medications   budesonide (PULMICORT) 0.5 MG/2ML nebulizer solution    Sig: Take 2 mLs (0.5 mg total) by nebulization 2 (two) times daily.    Dispense:  120 mL    Refill:  11   The patient has very severe COPD.  He does not have breath-holding capacity to use inhalers in a beneficial manner.  He is currently on DuoNeb 4 times a day.  Will continue this and add twice a day Pulmicort after DuoNeb.  He is to follow-up with [place] in 2 months time.  He is to contact us prior to that time should any new problems arise.  Renold Don, MD [facility]   *This note was dictated using voice recognition software/Dragon.  Despite best efforts to proofread,  errors can occur which can change the meaning.  Any change was purely unintentional.

## 2021-06-30 NOTE — Patient Instructions (Signed)
We are going to change your regimen as follows:  Use DuoNeb (ipratropium/albuterol) 4 times a day via nebulizer you may use it 1 extra time a day if needed for shortness of breath.  We are adding Pulmicort (budesonide) to use twice a day after your DuoNeb treatment.  Put your Advair and Spiriva aside as he will be using the nebulizer medications.  We will see him in follow-up in 2 months time call sooner should any new problems arise.

## 2021-07-01 ENCOUNTER — Encounter: Payer: Self-pay | Admitting: Pulmonary Disease

## 2021-07-21 ENCOUNTER — Telehealth: Payer: Self-pay | Admitting: Pulmonary Disease

## 2021-07-21 NOTE — Telephone Encounter (Signed)
Continue DuoNeb 4 times a day.  Discontinue Pulmicort.  I do not think that he gets enough of the medications from the inhalers and that is the reason to switch him to nebulized.

## 2021-07-21 NOTE — Telephone Encounter (Signed)
Spoke to patient, who feels that he is having side effects from Pulmicort. Sx started 2 days after starting Pulmicort and have worsened. He started Pulmicort 2 weeks ago.  C/o chest discomfort, wheezing and occ increased sob. Wears 2L cont. Spo2 is maintaining around 93%. Last dose of Pulmicort was this morning at 10:00. Advised patient to hold Pulmicort.  He is questioning if he should resume Advair and Spiriva.   Dr. Patsey Berthold, please advise. Thanks

## 2021-07-21 NOTE — Telephone Encounter (Signed)
Patient is aware of recommendations and voiced his understanding.  Nothing further needed at this time.   

## 2021-07-31 ENCOUNTER — Encounter: Payer: Self-pay | Admitting: Internal Medicine

## 2021-07-31 ENCOUNTER — Other Ambulatory Visit: Payer: Self-pay

## 2021-07-31 ENCOUNTER — Emergency Department: Payer: Medicare Other

## 2021-07-31 ENCOUNTER — Inpatient Hospital Stay
Admission: EM | Admit: 2021-07-31 | Discharge: 2021-08-02 | DRG: 190 | Disposition: A | Discharge status: Home / Self Care | Payer: Medicare Other | Attending: Internal Medicine | Admitting: Internal Medicine

## 2021-07-31 DIAGNOSIS — I252 Old myocardial infarction: Secondary | ICD-10-CM

## 2021-07-31 DIAGNOSIS — J9611 Chronic respiratory failure with hypoxia: Secondary | ICD-10-CM | POA: Diagnosis present

## 2021-07-31 DIAGNOSIS — Z825 Family history of asthma and other chronic lower respiratory diseases: Secondary | ICD-10-CM

## 2021-07-31 DIAGNOSIS — Z955 Presence of coronary angioplasty implant and graft: Secondary | ICD-10-CM

## 2021-07-31 DIAGNOSIS — Z8546 Personal history of malignant neoplasm of prostate: Secondary | ICD-10-CM

## 2021-07-31 DIAGNOSIS — J441 Chronic obstructive pulmonary disease with (acute) exacerbation: Secondary | ICD-10-CM | POA: Diagnosis not present

## 2021-07-31 DIAGNOSIS — Z79899 Other long term (current) drug therapy: Secondary | ICD-10-CM

## 2021-07-31 DIAGNOSIS — R0602 Shortness of breath: Secondary | ICD-10-CM

## 2021-07-31 DIAGNOSIS — J309 Allergic rhinitis, unspecified: Secondary | ICD-10-CM | POA: Diagnosis present

## 2021-07-31 DIAGNOSIS — I5022 Chronic systolic (congestive) heart failure: Secondary | ICD-10-CM | POA: Diagnosis present

## 2021-07-31 DIAGNOSIS — Z8249 Family history of ischemic heart disease and other diseases of the circulatory system: Secondary | ICD-10-CM

## 2021-07-31 DIAGNOSIS — Z87891 Personal history of nicotine dependence: Secondary | ICD-10-CM

## 2021-07-31 DIAGNOSIS — G479 Sleep disorder, unspecified: Secondary | ICD-10-CM | POA: Diagnosis present

## 2021-07-31 DIAGNOSIS — Z7982 Long term (current) use of aspirin: Secondary | ICD-10-CM

## 2021-07-31 DIAGNOSIS — E785 Hyperlipidemia, unspecified: Secondary | ICD-10-CM | POA: Diagnosis present

## 2021-07-31 DIAGNOSIS — I110 Hypertensive heart disease with heart failure: Secondary | ICD-10-CM | POA: Diagnosis present

## 2021-07-31 DIAGNOSIS — F411 Generalized anxiety disorder: Secondary | ICD-10-CM | POA: Diagnosis present

## 2021-07-31 DIAGNOSIS — Z8049 Family history of malignant neoplasm of other genital organs: Secondary | ICD-10-CM

## 2021-07-31 DIAGNOSIS — Z20822 Contact with and (suspected) exposure to covid-19: Secondary | ICD-10-CM | POA: Diagnosis present

## 2021-07-31 DIAGNOSIS — Z9981 Dependence on supplemental oxygen: Secondary | ICD-10-CM

## 2021-07-31 DIAGNOSIS — Z86010 Personal history of colonic polyps: Secondary | ICD-10-CM

## 2021-07-31 DIAGNOSIS — I2510 Atherosclerotic heart disease of native coronary artery without angina pectoris: Secondary | ICD-10-CM | POA: Diagnosis present

## 2021-07-31 DIAGNOSIS — I10 Essential (primary) hypertension: Secondary | ICD-10-CM | POA: Diagnosis present

## 2021-07-31 DIAGNOSIS — Z888 Allergy status to other drugs, medicaments and biological substances status: Secondary | ICD-10-CM

## 2021-07-31 DIAGNOSIS — J9601 Acute respiratory failure with hypoxia: Secondary | ICD-10-CM | POA: Diagnosis present

## 2021-07-31 DIAGNOSIS — K219 Gastro-esophageal reflux disease without esophagitis: Secondary | ICD-10-CM | POA: Diagnosis present

## 2021-07-31 DIAGNOSIS — G4700 Insomnia, unspecified: Secondary | ICD-10-CM | POA: Diagnosis present

## 2021-07-31 DIAGNOSIS — Z8711 Personal history of peptic ulcer disease: Secondary | ICD-10-CM

## 2021-07-31 LAB — CBC
HCT: 39 % (ref 39.0–52.0)
Hemoglobin: 12.6 g/dL — ABNORMAL LOW (ref 13.0–17.0)
MCH: 30.1 pg (ref 26.0–34.0)
MCHC: 32.3 g/dL (ref 30.0–36.0)
MCV: 93.3 fL (ref 80.0–100.0)
Platelets: 208 10*3/uL (ref 150–400)
RBC: 4.18 MIL/uL — ABNORMAL LOW (ref 4.22–5.81)
RDW: 12.9 % (ref 11.5–15.5)
WBC: 7 10*3/uL (ref 4.0–10.5)
nRBC: 0 % (ref 0.0–0.2)

## 2021-07-31 LAB — RESP PANEL BY RT-PCR (FLU A&B, COVID) ARPGX2
Influenza A by PCR: NEGATIVE
Influenza B by PCR: NEGATIVE
SARS Coronavirus 2 by RT PCR: NEGATIVE

## 2021-07-31 LAB — TROPONIN I (HIGH SENSITIVITY)
Troponin I (High Sensitivity): 8 ng/L (ref <18)
Troponin I (High Sensitivity): 8 ng/L (ref <18)

## 2021-07-31 LAB — BASIC METABOLIC PANEL
Anion gap: 12 (ref 5–15)
BUN: 12 mg/dL (ref 8–23)
CO2: 36 mmol/L — ABNORMAL HIGH (ref 22–32)
Calcium: 8.9 mg/dL (ref 8.9–10.3)
Chloride: 96 mmol/L — ABNORMAL LOW (ref 98–111)
Creatinine, Ser: 0.98 mg/dL (ref 0.61–1.24)
GFR, Estimated: >60 mL/min (ref >60)
Glucose, Bld: 102 mg/dL — ABNORMAL HIGH (ref 70–99)
Potassium: 3.3 mmol/L — ABNORMAL LOW (ref 3.5–5.1)
Sodium: 144 mmol/L (ref 135–145)

## 2021-07-31 LAB — PROCALCITONIN: Procalcitonin: <0.1 ng/mL

## 2021-07-31 MED ORDER — METHYLPREDNISOLONE SODIUM SUCC 125 MG IJ SOLR
125.0000 mg | Freq: Once | INTRAMUSCULAR | Status: AC
  Administered 2021-07-31: 125 mg via INTRAVENOUS
  Filled 2021-07-31: qty 2

## 2021-07-31 MED ORDER — MIRTAZAPINE 15 MG PO TABS
15.0000 mg | ORAL_TABLET | Freq: Every day | ORAL | Status: DC
  Administered 2021-07-31 – 2021-08-01 (×2): 15 mg via ORAL
  Filled 2021-07-31 (×2): qty 1

## 2021-07-31 MED ORDER — MOMETASONE FURO-FORMOTEROL FUM 200-5 MCG/ACT IN AERO
2.0000 | INHALATION_SPRAY | Freq: Two times a day (BID) | RESPIRATORY_TRACT | Status: DC
  Administered 2021-07-31 – 2021-08-02 (×4): 2 via RESPIRATORY_TRACT
  Filled 2021-07-31 (×2): qty 8.8

## 2021-07-31 MED ORDER — ENOXAPARIN SODIUM 40 MG/0.4ML IJ SOSY
40.0000 mg | PREFILLED_SYRINGE | INTRAMUSCULAR | Status: DC
  Administered 2021-07-31 – 2021-08-01 (×2): 40 mg via SUBCUTANEOUS
  Filled 2021-07-31 (×2): qty 0.4

## 2021-07-31 MED ORDER — ACETAMINOPHEN 650 MG RE SUPP: 650.0000 mg | Freq: Four times a day (QID) | RECTAL | Status: DC | PRN

## 2021-07-31 MED ORDER — NITROGLYCERIN 0.4 MG SL SUBL: 0.4000 mg | SUBLINGUAL_TABLET | SUBLINGUAL | Status: DC | PRN

## 2021-07-31 MED ORDER — FLUTICASONE PROPIONATE 50 MCG/ACT NA SUSP
2.0000 | Freq: Every day | NASAL | Status: DC
  Administered 2021-08-01 – 2021-08-02 (×2): 2 via NASAL
  Filled 2021-07-31 (×2): qty 16

## 2021-07-31 MED ORDER — IPRATROPIUM-ALBUTEROL 0.5-2.5 (3) MG/3ML IN SOLN
3.0000 mL | Freq: Once | RESPIRATORY_TRACT | Status: AC
  Administered 2021-07-31: 3 mL via RESPIRATORY_TRACT
  Filled 2021-07-31: qty 3

## 2021-07-31 MED ORDER — IPRATROPIUM-ALBUTEROL 0.5-2.5 (3) MG/3ML IN SOLN
6.0000 mL | Freq: Once | RESPIRATORY_TRACT | Status: AC
  Administered 2021-07-31: 6 mL via RESPIRATORY_TRACT
  Filled 2021-07-31: qty 6

## 2021-07-31 MED ORDER — LORAZEPAM 2 MG/ML IJ SOLN
0.5000 mg | INTRAMUSCULAR | Status: DC | PRN
Start: 2021-07-31 — End: 2021-08-02
  Administered 2021-07-31: 0.5 mg via INTRAVENOUS
  Filled 2021-07-31: qty 1

## 2021-07-31 MED ORDER — FUROSEMIDE 40 MG PO TABS
40.0000 mg | ORAL_TABLET | Freq: Every day | ORAL | Status: DC
  Administered 2021-08-01 – 2021-08-02 (×2): 40 mg via ORAL
  Filled 2021-07-31 (×2): qty 1

## 2021-07-31 MED ORDER — ESCITALOPRAM OXALATE 20 MG PO TABS
40.0000 mg | ORAL_TABLET | Freq: Every morning | ORAL | Status: DC
  Administered 2021-08-01 – 2021-08-02 (×2): 40 mg via ORAL
  Filled 2021-07-31 (×2): qty 2

## 2021-07-31 MED ORDER — IPRATROPIUM-ALBUTEROL 0.5-2.5 (3) MG/3ML IN SOLN
3.0000 mL | Freq: Four times a day (QID) | RESPIRATORY_TRACT | Status: DC
  Administered 2021-07-31 – 2021-08-02 (×9): 3 mL via RESPIRATORY_TRACT
  Filled 2021-07-31 (×9): qty 3

## 2021-07-31 MED ORDER — PANTOPRAZOLE SODIUM 40 MG PO TBEC
80.0000 mg | DELAYED_RELEASE_TABLET | Freq: Every day | ORAL | Status: DC
  Administered 2021-08-01 – 2021-08-02 (×2): 80 mg via ORAL
  Filled 2021-07-31 (×2): qty 2

## 2021-07-31 MED ORDER — LOSARTAN POTASSIUM 25 MG PO TABS
25.0000 mg | ORAL_TABLET | Freq: Every day | ORAL | Status: DC
  Administered 2021-08-01 – 2021-08-02 (×2): 25 mg via ORAL
  Filled 2021-07-31 (×2): qty 1

## 2021-07-31 MED ORDER — METHYLPREDNISOLONE SODIUM SUCC 40 MG IJ SOLR
40.0000 mg | Freq: Two times a day (BID) | INTRAMUSCULAR | Status: AC
  Administered 2021-07-31 – 2021-08-02 (×4): 40 mg via INTRAVENOUS
  Filled 2021-07-31 (×6): qty 1

## 2021-07-31 MED ORDER — OLANZAPINE 2.5 MG PO TABS
2.5000 mg | ORAL_TABLET | Freq: Every day | ORAL | Status: DC
  Administered 2021-07-31 – 2021-08-01 (×2): 2.5 mg via ORAL
  Filled 2021-07-31 (×3): qty 1

## 2021-07-31 MED ORDER — ACETAMINOPHEN 325 MG PO TABS: 650.0000 mg | ORAL_TABLET | Freq: Four times a day (QID) | ORAL | Status: DC | PRN

## 2021-07-31 MED ORDER — ASPIRIN EC 81 MG PO TBEC
81.0000 mg | DELAYED_RELEASE_TABLET | Freq: Every day | ORAL | Status: DC
  Administered 2021-08-01 – 2021-08-02 (×2): 81 mg via ORAL
  Filled 2021-07-31 (×2): qty 1

## 2021-07-31 MED ORDER — LORAZEPAM 1 MG PO TABS
1.0000 mg | ORAL_TABLET | Freq: Four times a day (QID) | ORAL | Status: DC | PRN
  Administered 2021-08-01 – 2021-08-02 (×5): 1 mg via ORAL
  Filled 2021-07-31 (×5): qty 1

## 2021-07-31 MED ORDER — ATORVASTATIN CALCIUM 20 MG PO TABS
80.0000 mg | ORAL_TABLET | Freq: Every day | ORAL | Status: DC
  Administered 2021-07-31: 20:00:00 40 mg via ORAL
  Administered 2021-08-01: 21:00:00 80 mg via ORAL
  Filled 2021-07-31 (×2): qty 4

## 2021-07-31 MED ORDER — ONDANSETRON HCL 4 MG PO TABS
4.0000 mg | ORAL_TABLET | Freq: Four times a day (QID) | ORAL | Status: DC | PRN
Start: 2021-07-31 — End: 2021-08-02

## 2021-07-31 MED ORDER — ONDANSETRON HCL 4 MG/2ML IJ SOLN
4.0000 mg | Freq: Four times a day (QID) | INTRAMUSCULAR | Status: DC | PRN
Start: 2021-07-31 — End: 2021-08-02

## 2021-07-31 NOTE — ED Triage Notes (Signed)
Pt here via [facility] with CP and SOB. Pt states that his pulmonologist started him on a new med and his CP started 2 days later. Pt then stopped taking the med but his CP continued. CP is left sided and radiates to his shoulder. Pt stable in triage.

## 2021-07-31 NOTE — ED Triage Notes (Signed)
Pt comes into the ED via [facility] from home c/o CP that has been ongoing for a couple days.  Pt has started a new medication but that's when the pain started so they have asked him to stop the medicine for the past 2 weeks.  Pt also states he has SHOB with the central pain.  Wheezing in all lobes per EMS and he wears 3L at baseline and he takes 4 breathing treatments per day.  2 breathing txt given with EMS.  324 ASA taken prior to EMS arrival, and patient has h/o bad rxn to nitro.

## 2021-07-31 NOTE — ED Provider Notes (Signed)
[facility] Emergency Department Provider Note ____________________________________________   Event Date/Time   First MD Initiated Contact with Patient 07/31/21 1339     (approximate)  I have reviewed the triage vital signs and the nursing notes.  HISTORY  Chief Complaint Chest Pain and Shortness of Breath   HPI Leslie Sims is a 70 y.o. malewho presents to the ED for evaluation of chest pain and shortness of breath.  Chart review indicates history of CAD s/p stenting, CHF with EF of 40%, severe COPD.  Saw his pulmonologist 1 month ago and Pulmicort neb was added to his DuoNeb regimen.  A couple weeks into his new regimen, he felt as though the Pulmicort was causing chest pain and discomfort, and was not effective, so he discontinued his Pulmicort at the direction of his pulmonologist over the phone but Advair and Spiriva were not continued.  He reports he is not currently using a controller medication.  Patient ports progressively worsening shortness of breath and chest tightness sensation.  He reports increased cough without sputum production.  Denies fevers, syncopal episodes, emesis or abdominal pain.  Reports slight improvement of his symptoms with duo nebs at home and with EMS, but still is persistent shortness of breath and nonproductive cough.  Past Medical History:  Diagnosis Date   Allergic rhinitis    Anxiety    Back pain    Cancer ([place])    prostate   Colon polyps 08/2012   colonoscopy; multiple colon polyps; repeat colonoscopy in one year.   COPD (chronic obstructive pulmonary disease) ([place])    a. on home O2.   Coronary artery disease 11/2009   a. late presenting ant MI; b. LHC 100% pLAD s/p PCI/DES, 99% mRCA s/p PCI/DES, EF 35%; c. nuclear stress test 05/13: prior ant/inf infarcts w/o ischemia, EF 43%; d. LHC 02/14: widely patent stents with no other obs dz, EF 40%; e. LHC 11/17: LM nl, mLAD 10%, patent LAD stent, dLAD 20%, p-mRCA 10%,  mRCA 40%, patent RCA stent, EF 35-45%    Depression    GERD (gastroesophageal reflux disease)    [id number])    Helicobacter pylori (H. pylori)    Hemorrhoids    Hernia    inguinal   HFrEF (heart failure with reduced ejection fraction) ([place])    a. 10/2016 LV gram: EF 35-45%; b. 06/2018 Echo: EF 40-45%. c. 01/2021 Echo: EF 40-45%   Hyperlipidemia    Hypertension    Insomnia    Ischemic cardiomyopathy    a. 10/2016 LV gram: EF 35-45%; b. 06/2018 Echo: EF 40-45%.   Myalgia    Peptic ulcer    Pulmonary nodules    ST elevation (STEMI) myocardial infarction involving left anterior descending coronary artery ([place]) 11/2009   a. s/p PCI to the LAD   Status post dilation of esophageal narrowing 2000   Tinea pedis    Wears dentures    partial upper    Patient Active Problem List   Diagnosis Date Noted   Prostate cancer ([place]) 10/11/2020   Elevated PSA 07/28/2020   Iron deficiency anemia 05/20/2020   Goals of care, counseling/discussion    Palliative care by specialist    Healthcare-associated pneumonia 01/28/2019   COPD with acute exacerbation ([place]) 12/31/2018   Generalized anxiety disorder 11/12/2018   Moderate episode of recurrent major depressive disorder ([place]) 11/12/2018   Acute respiratory failure with hypoxia ([place]) 09/29/2018   Acute respiratory failure with hypoxia and hypercapnia ([place]) 09/13/2018   Acute on  chronic respiratory failure with hypoxia and hypercapnia ([place]) 08/24/2018   Chest pain 07/02/2018   Grief reaction 01/13/2018   COPD with hypoxia ([place]) 12/25/2017   Unstable angina (H[facility])    Benign neoplasm of ascending colon    Benign neoplasm of descending colon    Benign neoplasm of sigmoid colon    History of colonic polyps    Pulmonary nodules/lesions, multiple 01/05/2015   Bruit of left carotid artery 11/04/2014   Sleep disorder 07/18/2013   Seasonal and perennial allergic rhinitis 05/29/2013   Bradycardia 04/20/2011   SMOKER 07/30/2010   CAD, NATIVE VESSEL  04/23/2010   Hyperlipidemia 03/24/2010   DEPRESSION/ANXIETY [id number]   Chronic systolic heart failure ([place]) 03/24/2010   COPD, very severe ([place]) 03/24/2010   GERD 03/24/2010   Essential hypertension 11/21/2009    Past Surgical History:  Procedure Laterality Date   Admission  12/20/2012   COPD exacerbation.  [place].   CARDIAC CATHETERIZATION     CARDIAC CATHETERIZATION  02/05/13   [facility]   CARDIAC CATHETERIZATION  10/14   [facility] : patent stents with no change in anatomy. EF: 40$   CARDIAC CATHETERIZATION Left 11/12/2016   Procedure: Left Heart Cath and Coronary Angiography;  Surgeon: Wellington Hampshire, MD;  Location: [place] [facility];  Service: Cardiovascular;  Laterality: Left;   COLONOSCOPY     COLONOSCOPY WITH PROPOFOL N/A 05/18/2016   Procedure: COLONOSCOPY WITH PROPOFOL;  Surgeon: Lucilla Lame, MD;  Location: [facility] ENDOSCOPY;  Service: Endoscopy;  Laterality: N/A;   COLONOSCOPY WITH PROPOFOL N/A 03/19/2020   Procedure: COLONOSCOPY WITH PROPOFOL;  Surgeon: Lin Landsman, MD;  Location: [facility] ENDOSCOPY;  Service: Gastroenterology;  Laterality: N/A;   CORONARY ANGIOPLASTY WITH STENT PLACEMENT  09/2009   LAD 3.0 X23 mm Xience DES, RCA: 4.0 X 15 mm Xience DES   ELECTROMAGNETIC NAVIGATION BROCHOSCOPY N/A 10/27/2017   Procedure: ELECTROMAGNETIC NAVIGATION BRONCHOSCOPY;  Surgeon: Flora Lipps, MD;  Location: [facility] ORS;  Service: Cardiopulmonary;  Laterality: N/A;   ESOPHAGEAL DILATION     ESOPHAGOGASTRODUODENOSCOPY  2008   ESOPHAGOGASTRODUODENOSCOPY  08/20/2012   HERNIA REPAIR  07/20/2012   L inguinal hernia repair   PENILE PROSTHESIS IMPLANT      Prior to Admission medications   Medication Sig Start Date End Date Taking? Authorizing Provider  albuterol (VENTOLIN HFA) 108 (90 Base) MCG/ACT inhaler Inhale 2 puffs into the lungs every 6 (six) hours as needed for wheezing or shortness of breath. 05/04/21   Margarette Canada, NP  aspirin 81 MG tablet Take 81 mg by mouth daily.    [provider]  atorvastatin (LIPITOR) 80 MG tablet Take 1 tablet (80 mg total) by mouth daily. 03/18/21 06/16/21  Rise Mu, PA-C  escitalopram (LEXAPRO) 20 MG tablet Take 2 tablets by mouth every morning. 04/28/21   [provider]  esomeprazole (NEXIUM) 40 MG capsule Take 1 capsule (40 mg total) by mouth daily. 03/18/21 08/15/21  [name], MD  fluticasone (FLONASE) 50 MCG/ACT nasal spray Place 2 sprays into both nostrils daily. 06/08/21   [provider]  furosemide (LASIX) 40 MG tablet Take 1 tablet (40 mg total) by mouth daily. 02/06/21 02/01/22  Theora Gianotti, NP  ipratropium-albuterol (DUONEB) 0.5-2.5 (3) MG/3ML SOLN USE 1 VIAL VIA NEBULIZER AND INHALE BY MOUTH 4 TIMES A DAY 02/20/21   [name], MD  loratadine (CLARITIN) 10 MG tablet Take 10 mg by mouth daily. 06/08/21   [provider]  LORazepam (ATIVAN) 1 MG tablet Take  1 tablet (1 mg total) by mouth every 4 (four) hours while awake. 02/14/19   [name], MD  losartan (COZAAR) 25 MG tablet Take 25 mg by mouth daily. 02/23/19   [provider]  mirtazapine (REMERON) 15 MG tablet Take 15 mg by mouth at bedtime. 06/02/20   [provider]  Multiple Vitamin (MULTIVITAMIN) capsule Take 1 capsule by mouth daily.    [provider]  nitroGLYCERIN (NITROSTAT) 0.4 MG SL tablet Place 1 tablet (0.4 mg total) under the tongue every 5 (five) minutes as needed. 06/04/20   Loel Dubonnet, NP  OLANZapine (ZYPREXA) 2.5 MG tablet Take 2.5 mg by mouth at bedtime.    [provider]  OXYGEN Inhale 3 L into the lungs daily.     [provider]  OXYGEN Inhale 2.5 L into the lungs daily.    [provider]  Spacer/Aero-Holding Chambers (AEROCHAMBER MV) inhaler Use as instructed 05/04/21   Margarette Canada, NP  sucralfate (CARAFATE) 1 g tablet Take 1 g by mouth 4 (four) times daily. 06/03/20   [provider]    Allergies Prozac [fluoxetine hcl],  Effexor xr [venlafaxine hcl er], and Wellbutrin [bupropion]  Family History  Problem Relation Age of Onset   Heart attack Brother        Brother #1   Diabetes Brother    Hypertension Brother        #3   Coronary artery disease Father 68       deceased   Heart attack Father    Diabetes Father    Heart disease Father    COPD Mother 29       deceased   Alcohol abuse Sister        polysubstance abuse   COPD Sister    Lung cancer Sister    Alcohol abuse Sister        polysubstance abuse   Penile cancer Brother    Diabetes Brother    Prostate cancer Neg Hx    Bladder Cancer Neg Hx    Kidney cancer Neg Hx     Social History Social History   Tobacco Use   Smoking status: Former    Packs/day: 3.00    Years: 42.00    Pack years: 126.00    Types: Cigarettes    Quit date: 02/10/2019    Years since quitting: 2.4   Smokeless tobacco: Never   Tobacco comments:    25 months ago most smoked 4 packs a day .  Vaping Use   Vaping Use: Never used  Substance Use Topics   Alcohol use: No    Alcohol/week: 0.0 standard drinks   Drug use: No    Review of Systems  Constitutional: No fever/chills Eyes: No visual changes. ENT: No sore throat. Cardiovascular: Positive for chest pain. Respiratory: Positive for nonproductive cough and shortness of breath. Gastrointestinal: No abdominal pain.  No nausea, no vomiting.  No diarrhea.  No constipation. Genitourinary: Negative for dysuria. Musculoskeletal: Negative for back pain. Skin: Negative for rash. Neurological: Negative for headaches, focal weakness or numbness.  ____________________________________________   PHYSICAL EXAM:  VITAL SIGNS: Vitals:   07/31/21 1345 07/31/21 1400  BP: 120/65 120/68  Pulse: 80 77  Resp:    Temp:    SpO2: 99% 98%     Constitutional: Alert and oriented. Well appearing and in no acute distress. Eyes: Conjunctivae are normal. PERRL. EOMI. Head: Atraumatic. Nose: No  congestion/rhinnorhea. Mouth/Throat: Mucous membranes are moist.  Oropharynx non-erythematous. Neck:  No stridor. No cervical spine tenderness to palpation. Cardiovascular: Normal rate, regular rhythm. Grossly normal heart sounds.  Good peripheral circulation. Respiratory: Minimal tachypnea to the low 20s.  No further evidence of distress.  Nearly silent lung sounds on auscultation due to significant decreased aeration. On recheck, airflow improves and diffuse expiratory wheezes are noted. Gastrointestinal: Soft , nondistended, nontender to palpation. No CVA tenderness. Musculoskeletal: No lower extremity tenderness nor edema.  No joint effusions. No signs of acute trauma. Neurologic:  Normal speech and language. No gross focal neurologic deficits are appreciated. No gait instability noted. Skin:  Skin is warm, dry and intact. No rash noted. Psychiatric: Mood and affect are normal. Speech and behavior are normal.  ____________________________________________   LABS (all labs ordered are listed, but only abnormal results are displayed)  Labs Reviewed  BASIC METABOLIC PANEL - Abnormal; Notable for the following components:      Result Value   Potassium 3.3 (*)    Chloride 96 (*)    CO2 36 (*)    Glucose, Bld 102 (*)    All other components within normal limits  CBC - Abnormal; Notable for the following components:   RBC 4.18 (*)    Hemoglobin 12.6 (*)    All other components within normal limits  RESP PANEL BY RT-PCR (FLU A&B, COVID) [id number]  TROPONIN I (HIGH SENSITIVITY)  TROPONIN I (HIGH SENSITIVITY)   ____________________________________________  12 Lead EKG  Sinus rhythm, rate of 79 bpm.  Leftward axis and right bundle.  No STEMI. ____________________________________________  RADIOLOGY  ED MD interpretation: 2 view CXR reviewed by me without evidence of acute cardiopulmonary pathology.  Official radiology report(s): DG Chest 2 View  Result Date: 07/31/2021 CLINICAL  DATA:  Chest pain/shortness of breath. EXAM: CHEST - 2 VIEW COMPARISON:  Chest radiograph 05/04/2021; CT chest 01/21/2021 FINDINGS: The heart size and mediastinal contours are within normal limits. Subtle scarring in the right upper lobe is unchanged. No new focal consolidation, pleural effusion, or pneumothorax. Stable 1.0 cm nodule in the left upper lobe. Multilevel degenerative changes throughout the visualized mid and distal thoracic spine. Unchanged oval calcification overlying the right axilla. IMPRESSION: No acute cardiopulmonary abnormality. Electronically Signed   By: Ileana Roup M.D.   On: 07/31/2021 14:01    ____________________________________________   PROCEDURES and INTERVENTIONS  Procedure(s) performed (including Critical Care):  Procedures  Medications  ipratropium-albuterol (DUONEB) 0.5-2.5 (3) MG/3ML nebulizer solution 3 mL (has no administration in time range)  methylPREDNISolone sodium succinate (SOLU-MEDROL) 125 mg/2 mL injection 125 mg (125 mg Intravenous Given 07/31/21 1431)  ipratropium-albuterol (DUONEB) 0.5-2.5 (3) MG/3ML nebulizer solution 6 mL (6 mLs Nebulization Given 07/31/21 1431)    ____________________________________________   MDM / ED COURSE   70 year old male with severe COPD presents to the ED with evidence of an exacerbation requiring medical observation admission.  Normal vitals on his home O2 without hypoxia.  No evidence of CAP, PTX or ACS.  Continued wheezing and poor airflow despite multiple DuoNeb's and steroids, I suspect due to his medication adjustments recently and nonadherence.  Due to his continued symptoms we will admit to medicine for further work-up and management.  Clinical Course as of 07/31/21 1531  Fri Jul 31, 2021  1422 Discussed plan of care with the patient and the possibility of admission to the severity of his COPD exacerbation.  We discussed breathing treatments and steroids and reassessment. [DS]  [id number].  Slightly  improved aeration on pulmonary auscultation, and actually hear his wheezing now.  Still quite tight.  We discussed medical observation admission and he is agreeable. [DS]    Clinical Course User Index [DS] Vladimir Crofts, MD    ____________________________________________   FINAL CLINICAL IMPRESSION(S) / ED DIAGNOSES  Final diagnoses:  COPD exacerbation ([place])  Shortness of breath     ED Discharge Orders     None        Ailis Rigaud Tamala Julian   Note:  This document was prepared using Dragon voice recognition software and may include unintentional dictation errors.    Vladimir Crofts, MD 07/31/21 [phone]

## 2021-07-31 NOTE — Progress Notes (Addendum)
   07/31/21 1840  Respiratory  Bilateral Breath Sounds Expiratory wheezes;Inspiratory wheezes  Respiratory Pattern Regular;Unlabored  Chest Assessment Chest expansion symmetrical  Administered nebulizer see MAR. Reported to RT

## 2021-07-31 NOTE — H&P (Signed)
History and Physical   [facility] [id number] DOB: Sep 06, 1951 DOA: 07/31/2021  PCP: [name], MD  Outpatient Specialists: Dr. Patsey Berthold, pulmonologist Patient coming from: Home via EMS  I have personally briefly reviewed patient's old medical records in [place].  Chief Concern: Shortness of breath and chest pain  HPI: Leslie Sims is a 70 y.o. male with medical history significant for severe COPD, hypertension, cad s/p pci about 10 years ago, hyperlipidemia, GERD, depression, anxiety, presents emergency department for chief concerns of shortness of breath.  He stopped taking his medications about 1 week ago. He developed this shortness about two days ago. He denies new cough and/or sick contacts. He currently denies chest pain. Initially it was light and happened 2 weeks ago.  He denies nausea, vomiting, abdominal pain, dysuria, hematuria.  He states that he has improved minimally with the treatments in the emergency department.  He reports that he developed the chest pain after pulmicort treatment that his pulmonologist prescribed him.  He called the pulmonologist office and was told to stop Pulmicort and continue not taking Advair and Spiriva.  Additionally he states that he did not know that he was supposed to take them Pulmicort nebulizer after the DuoNeb treatments.  He felt confused about the timing of the medication.  Social history: He lives by himself. He lost his wife 4 years ago. He is a former tobacco user, quit 2.5 years ago, at his peak he smoked 5 ppd. 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 He denies etoh and recreational drug use. He is retired now.   Vaccination history: He is vaccinated for covid 19, including two booster shots.   ROS: Constitutional: no weight change, no fever ENT/Mouth: no sore throat, no rhinorrhea Eyes: no eye pain, no vision changes Cardiovascular: no chest pain, no dyspnea,  no  edema, no palpitations Respiratory: no cough, no sputum, no wheezing Gastrointestinal: no nausea, no vomiting, no diarrhea, no constipation Genitourinary: no urinary incontinence, no dysuria, no hematuria Musculoskeletal: no arthralgias, no myalgias Skin: no skin lesions, no pruritus, Neuro: + weakness, no loss of consciousness, no syncope Psych: no anxiety, no depression, + decrease appetite Heme/Lymph: no bruising, no bleeding  ED Course: Discussed with emergency medicine provider, patient requiring hospitalization for COPD exacerbation.  Vitals in the emergency department was remarkable for temperature of 98.3, respiration rate of 18, heart rate of 77, blood pressure 120/68, SPO2 of 98% on 3 L nasal cannula.  Labs in the emergency department was remarkable for sodium level 144, potassium 3.3, chloride 96, bicarb 36, BUN 12, serum creatinine of 0.98, nonfasting blood glucose 102, WBC of 7, hemoglobin 12.6, platelets 208, COVID PCR was negative.  High-sensitivity troponin was 8.  ED provider gave patient 2 treatments of DuoNeb's, and Solu-Medrol 125 mg IV.  Assessment/Plan  Principal Problem:   COPD exacerbation (HCC) Active Problems:   Hyperlipidemia   Essential hypertension   GERD   Sleep disorder   Acute respiratory failure with hypoxia (HCC)   COPD with acute exacerbation (HCC)   Generalized anxiety disorder   # Shortness of breath suspect secondary to COPD exacerbation in setting of recent COPD medication changes and patient not taking his medications as prescribed - Status post duo nebs x2, Solu-Medrol 125 mg IV per EDP - We will order Solu-Medrol 40 mg IV every 12 hours - Resumed home DuoNebs 4 times daily, maintenance/long-acting inhalers at home is Advair and I replace that with [facility]  twice daily -I educated patient on Pulmicort timing per pulmonologist note - Would recommend a.m. team to consult pulmonology if patient does not continue to improve in the a.m - Admit  to MedSurg, observation, no telemetry at this time  # Hyperlipidemia-atorvastatin 80 mg nightly resumed  # Depression/anxiety-escitalopram 40 mg every morning resumed, mirtazapine 15 mg p.o. nightly resumed  # Psychiatric imbalance-olanzapine 2.5 mg p.o. nightly resumed  # History of hypertension-resume losartan 25 mg daily - Furosemide 40 mg daily  # GERD  # History of CAD-appears stable at this time low clinical suspicion for ACS - Resumed atorvastatin 80 mg nightly, nitroglycerin 0.4 mg sublingual every 5 minutes as needed for chest pain, aspirin 81 mg daily  Chart reviewed.   DVT prophylaxis: Enoxaparin 40 mg subcutaneous every 24 hours Code Status: Full code Diet: Heart healthy Family Communication: No Disposition Plan: Pending clinical course Consults called: No Admission status: MedSurg, observation, no telemetry  Past Medical History:  Diagnosis Date   Allergic rhinitis    Anxiety    Back pain    Cancer ([place])    prostate   Colon polyps 08/2012   colonoscopy; multiple colon polyps; repeat colonoscopy in one year.   COPD (chronic obstructive pulmonary disease) ([place])    a. on home O2.   Coronary artery disease 11/2009   a. late presenting ant MI; b. LHC 100% pLAD s/p PCI/DES, 99% mRCA s/p PCI/DES, EF 35%; c. nuclear stress test 05/13: prior ant/inf infarcts w/o ischemia, EF 43%; d. LHC 02/14: widely patent stents with no other obs dz, EF 40%; e. LHC 11/17: LM nl, mLAD 10%, patent LAD stent, dLAD 20%, p-mRCA 10%, mRCA 40%, patent RCA stent, EF 35-45%    Depression    GERD (gastroesophageal reflux disease)    [id number])    Helicobacter pylori (H. pylori)    Hemorrhoids    Hernia    inguinal   HFrEF (heart failure with reduced ejection fraction) ([place])    a. 10/2016 LV gram: EF 35-45%; b. 06/2018 Echo: EF 40-45%. c. 01/2021 Echo: EF 40-45%   Hyperlipidemia    Hypertension    Insomnia    Ischemic cardiomyopathy    a. 10/2016 LV gram: EF 35-45%; b. 06/2018 Echo: EF  40-45%.   Myalgia    Peptic ulcer    Pulmonary nodules    ST elevation (STEMI) myocardial infarction involving left anterior descending coronary artery ([place]) 11/2009   a. s/p PCI to the LAD   Status post dilation of esophageal narrowing 2000   Tinea pedis    Wears dentures    partial upper   Past Surgical History:  Procedure Laterality Date   Admission  12/20/2012   COPD exacerbation.  [place].   CARDIAC CATHETERIZATION     CARDIAC CATHETERIZATION  02/05/13   [facility]   CARDIAC CATHETERIZATION  10/14   [facility] : patent stents with no change in anatomy. EF: 40$   CARDIAC CATHETERIZATION Left 11/12/2016   Procedure: Left Heart Cath and Coronary Angiography;  Surgeon: Wellington Hampshire, MD;  Location: [facility];  Service: Cardiovascular;  Laterality: Left;   COLONOSCOPY     COLONOSCOPY WITH PROPOFOL N/A 05/18/2016   Procedure: COLONOSCOPY WITH PROPOFOL;  Surgeon: Lucilla Lame, MD;  Location: [facility] ENDOSCOPY;  Service: Endoscopy;  Laterality: N/A;   COLONOSCOPY WITH PROPOFOL N/A 03/19/2020   Procedure: COLONOSCOPY WITH PROPOFOL;  Surgeon: Lin Landsman, MD;  [facility] END[facility]COPY;  Service: Gastroenterology;  Laterality: N/A;   CORONARY ANGIOPLASTY WITH  STENT PLACEMENT  09/2009   LAD 3.0 X23 mm Xience DES, RCA: 4.0 X 15 mm Xience DES   ELECTROMAGNETIC NAVIGATION BROCHOSCOPY N/A 10/27/2017   Procedure: ELECTROMAGNETIC NAVIGATION BRONCHOSCOPY;  Surgeon: Flora Lipps, MD;  Location: [facility] ORS;  Service: Cardiopulmonary;  Laterality: N/A;   ESOPHAGEAL DILATION     ESOPHAGOGASTRODUODENOSCOPY  2008   ESOPHAGOGASTRODUODENOSCOPY  08/20/2012   HERNIA REPAIR  07/20/2012   L inguinal hernia repair   PENILE PROSTHESIS IMPLANT     Social History:  reports that he quit smoking about 2 years ago. His smoking use included cigarettes. He has a 126.00 pack-year smoking history. He has never used smokeless tobacco. He reports that he does not drink alcohol and does not use drugs.  Allergies  Allergen  Reactions   Prozac [Fluoxetine Hcl] Shortness Of Breath   Effexor Xr [Venlafaxine Hcl Er] Other (See Comments)    "Makes me feel funny"   Wellbutrin [Bupropion] Other (See Comments)    "Makes me feel funny"   Family History  Problem Relation Age of Onset   Heart attack Brother        Brother #1   Diabetes Brother    Hypertension Brother        #3   Coronary artery disease Father 22       deceased   Heart attack Father    Diabetes Father    Heart disease Father    COPD Mother 57       deceased   Alcohol abuse Sister        polysubstance abuse   COPD Sister    Lung cancer Sister    Alcohol abuse Sister        polysubstance abuse   Penile cancer Brother    Diabetes Brother    Prostate cancer Neg Hx    Bladder Cancer Neg Hx    Kidney cancer Neg Hx    Family history: Family history reviewed and not pertinent  Prior to Admission medications   Medication Sig Start Date End Date Taking? Authorizing Provider  albuterol (VENTOLIN HFA) 108 (90 Base) MCG/ACT inhaler Inhale 2 puffs into the lungs every 6 (six) hours as needed for wheezing or shortness of breath. 05/04/21   Margarette Canada, NP  aspirin 81 MG tablet Take 81 mg by mouth daily.    [provider]  atorvastatin (LIPITOR) 80 MG tablet Take 1 tablet (80 mg total) by mouth daily. 03/18/21 06/16/21  Rise Mu, PA-C  escitalopram (LEXAPRO) 20 MG tablet Take 2 tablets by mouth every morning. 04/28/21   [provider]  esomeprazole (NEXIUM) 40 MG capsule Take 1 capsule (40 mg total) by mouth daily. 03/18/21 08/15/21  [name], MD  fluticasone (FLONASE) 50 MCG/ACT nasal spray Place 2 sprays into both nostrils daily. 06/08/21   [provider]  furosemide (LASIX) 40 MG tablet Take 1 tablet (40 mg total) by mouth daily. 02/06/21 02/01/22  Theora Gianotti, NP  ipratropium-albuterol (DUONEB) 0.5-2.5 (3) MG/3ML SOLN USE 1 VIAL VIA NEBULIZER AND INHALE BY MOUTH 4 TIMES A DAY 02/20/21   [name], MD  loratadine (CLARITIN) 10 MG tablet Take 10 mg by mouth daily. 06/08/21   [provider]  LORazepam (ATIVAN) 1 MG tablet Take 1 tablet (1 mg total) by mouth every 4 (four) hours while awake. 02/14/19   [name], MD  losartan (COZAAR) 25 MG tablet Take 25 mg by mouth daily. 02/23/19   [provider]  mirtazapine (  REMERON) 15 MG tablet Take 15 mg by mouth at bedtime. 06/02/20   [provider]  Multiple Vitamin (MULTIVITAMIN) capsule Take 1 capsule by mouth daily.    [provider]  nitroGLYCERIN (NITROSTAT) 0.4 MG SL tablet Place 1 tablet (0.4 mg total) under the tongue every 5 (five) minutes as needed. 06/04/20   Loel Dubonnet, NP  OLANZapine (ZYPREXA) 2.5 MG tablet Take 2.5 mg by mouth at bedtime.    [provider]  OXYGEN Inhale 3 L into the lungs daily.     [provider]  OXYGEN Inhale 2.5 L into the lungs daily.    [provider]  Spacer/Aero-Holding Chambers (AEROCHAMBER MV) inhaler Use as instructed 05/04/21   Margarette Canada, NP  sucralfate (CARAFATE) 1 g tablet Take 1 g by mouth 4 (four) times daily. 06/03/20   [provider]   Physical Exam: Vitals:   07/31/21 1315 07/31/21 1316 07/31/21 1345 07/31/21 1400  BP: (!) 127/92  120/65 120/68  Pulse: 79  80 77  Resp: 18     Temp: 98.3 F (36.8 C)     TempSrc: Oral     SpO2: 97%  99% 98%  Weight:  90.7 kg    Height:  '5\' 10"'$  (1.778 m)     Constitutional: appears older than chronological age, frail, NAD, calm, comfortable Eyes: PERRL, lids and conjunctivae normal ENMT: Mucous membranes are moist. Posterior pharynx clear of any exudate or lesions. Age-appropriate dentition. Hearing appropriate Neck: normal, supple, no masses, no thyromegaly Respiratory: Mild diffuse end expiratory wheezing on auscultation bilaterally, no crackles.  Increased respiratory effort.  Mild increase use of accessory muscle use.  Cardiovascular: Regular rate and rhythm,  no murmurs / rubs / gallops. No extremity edema. 2+ pedal pulses. No carotid bruits.  Abdomen: no tenderness, no masses palpated, no hepatosplenomegaly. Bowel sounds positive.  Musculoskeletal: no clubbing / cyanosis. No joint deformity upper and lower extremities. Good ROM, no contractures, no atrophy. Normal muscle tone.  Skin: no rashes, lesions, ulcers. No induration Neurologic: Sensation intact. Strength 5/5 in all 4.  Psychiatric: Normal judgment and insight. Alert and oriented x 3. Normal mood.   EKG: independently reviewed, showing sinus rhythm with rate of 79, QTc 493  Chest x-ray on Admission: I personally reviewed and I agree with radiologist reading as below.  DG Chest 2 View  Result Date: 07/31/2021 CLINICAL DATA:  Chest pain/shortness of breath. EXAM: CHEST - 2 VIEW COMPARISON:  Chest radiograph 05/04/2021; CT chest 01/21/2021 FINDINGS: The heart size and mediastinal contours are within normal limits. Subtle scarring in the right upper lobe is unchanged. No new focal consolidation, pleural effusion, or pneumothorax. Stable 1.0 cm nodule in the left upper lobe. Multilevel degenerative changes throughout the visualized mid and distal thoracic spine. Unchanged oval calcification overlying the right axilla. IMPRESSION: No acute cardiopulmonary abnormality. Electronically Signed   By: Ileana Roup M.D.   On: 07/31/2021 14:01    Labs on Admission: I have personally reviewed following labs  CBC: Recent Labs  Lab 07/31/21 1320  WBC 7.0  HGB 12.6*  HCT 39.0  MCV 93.3  PLT [id number]   Basic Metabolic Panel: Recent Labs  Lab 07/31/21 1320  NA 144  K 3.3*  CL 96*  CO2 36*  GLUCOSE 102*  BUN 12  CREATININE 0.98  CALCIUM 8.9   GFR: Estimated Creatinine Clearance: 82.9 mL/min (by C-G formula based on SCr of 0.98 mg/dL).  Urine analysis:    Component Value Date/Time  COLORURINE YELLOW (A) 10/18/2018 1943   APPEARANCEUR CLEAR (A) 10/18/2018 1943   APPEARANCEUR Clear 01/10/2013  1205   LABSPEC 1.009 10/18/2018 1943   LABSPEC 1.004 01/10/2013 1205   PHURINE 6.0 10/18/2018 [place] 10/18/2018 1943   GLUCOSEU 50 mg/dL 01/10/2013 [place] 10/18/2018 [place] 10/18/2018 1943   BILIRUBINUR negative 07/27/2017 1259   BILIRUBINUR neg 05/10/2014 1208   BILIRUBINUR Negative 01/10/2013 [place] 10/18/2018 1943   PROTEINUR NEGATIVE 10/18/2018 1943   UROBILINOGEN 0.2 07/27/2017 1259   NITRITE NEGATIVE 10/18/2018 1943   LEUKOCYTESUR NEGATIVE 10/18/2018 1943   LEUKOCYTESUR Negative 01/10/2013 1205   Dr. Tobie Poet [facility]  If 7PM-7AM, please contact overnight-coverage provider If 7AM-7PM, please contact day coverage provider [email or web address]  07/31/2021, 3:53 PM

## 2021-08-01 DIAGNOSIS — G4700 Insomnia, unspecified: Secondary | ICD-10-CM | POA: Diagnosis present

## 2021-08-01 DIAGNOSIS — Z8711 Personal history of peptic ulcer disease: Secondary | ICD-10-CM | POA: Diagnosis not present

## 2021-08-01 DIAGNOSIS — I2510 Atherosclerotic heart disease of native coronary artery without angina pectoris: Secondary | ICD-10-CM | POA: Diagnosis present

## 2021-08-01 DIAGNOSIS — I5022 Chronic systolic (congestive) heart failure: Secondary | ICD-10-CM | POA: Diagnosis present

## 2021-08-01 DIAGNOSIS — Z8249 Family history of ischemic heart disease and other diseases of the circulatory system: Secondary | ICD-10-CM | POA: Diagnosis not present

## 2021-08-01 DIAGNOSIS — J441 Chronic obstructive pulmonary disease with (acute) exacerbation: Secondary | ICD-10-CM | POA: Diagnosis present

## 2021-08-01 DIAGNOSIS — Z955 Presence of coronary angioplasty implant and graft: Secondary | ICD-10-CM | POA: Diagnosis not present

## 2021-08-01 DIAGNOSIS — E785 Hyperlipidemia, unspecified: Secondary | ICD-10-CM | POA: Diagnosis present

## 2021-08-01 DIAGNOSIS — J309 Allergic rhinitis, unspecified: Secondary | ICD-10-CM | POA: Diagnosis present

## 2021-08-01 DIAGNOSIS — Z87891 Personal history of nicotine dependence: Secondary | ICD-10-CM | POA: Diagnosis not present

## 2021-08-01 DIAGNOSIS — I110 Hypertensive heart disease with heart failure: Secondary | ICD-10-CM | POA: Diagnosis present

## 2021-08-01 DIAGNOSIS — Z8049 Family history of malignant neoplasm of other genital organs: Secondary | ICD-10-CM | POA: Diagnosis not present

## 2021-08-01 DIAGNOSIS — F411 Generalized anxiety disorder: Secondary | ICD-10-CM | POA: Diagnosis present

## 2021-08-01 DIAGNOSIS — Z20822 Contact with and (suspected) exposure to covid-19: Secondary | ICD-10-CM | POA: Diagnosis present

## 2021-08-01 DIAGNOSIS — Z888 Allergy status to other drugs, medicaments and biological substances status: Secondary | ICD-10-CM | POA: Diagnosis not present

## 2021-08-01 DIAGNOSIS — J9601 Acute respiratory failure with hypoxia: Secondary | ICD-10-CM | POA: Diagnosis present

## 2021-08-01 DIAGNOSIS — Z7982 Long term (current) use of aspirin: Secondary | ICD-10-CM | POA: Diagnosis not present

## 2021-08-01 DIAGNOSIS — I252 Old myocardial infarction: Secondary | ICD-10-CM | POA: Diagnosis not present

## 2021-08-01 DIAGNOSIS — Z8546 Personal history of malignant neoplasm of prostate: Secondary | ICD-10-CM | POA: Diagnosis not present

## 2021-08-01 DIAGNOSIS — K219 Gastro-esophageal reflux disease without esophagitis: Secondary | ICD-10-CM | POA: Diagnosis present

## 2021-08-01 DIAGNOSIS — Z9981 Dependence on supplemental oxygen: Secondary | ICD-10-CM | POA: Diagnosis not present

## 2021-08-01 DIAGNOSIS — Z79899 Other long term (current) drug therapy: Secondary | ICD-10-CM | POA: Diagnosis not present

## 2021-08-01 DIAGNOSIS — Z825 Family history of asthma and other chronic lower respiratory diseases: Secondary | ICD-10-CM | POA: Diagnosis not present

## 2021-08-01 DIAGNOSIS — Z86010 Personal history of colonic polyps: Secondary | ICD-10-CM | POA: Diagnosis not present

## 2021-08-01 DIAGNOSIS — R0602 Shortness of breath: Secondary | ICD-10-CM | POA: Diagnosis present

## 2021-08-01 LAB — BASIC METABOLIC PANEL
Anion gap: 13 (ref 5–15)
BUN: 16 mg/dL (ref 8–23)
CO2: 32 mmol/L (ref 22–32)
Calcium: 9.4 mg/dL (ref 8.9–10.3)
Chloride: 94 mmol/L — ABNORMAL LOW (ref 98–111)
Creatinine, Ser: 0.99 mg/dL (ref 0.61–1.24)
GFR, Estimated: >60 mL/min (ref >60)
Glucose, Bld: 177 mg/dL — ABNORMAL HIGH (ref 70–99)
Potassium: 3.5 mmol/L (ref 3.5–5.1)
Sodium: 139 mmol/L (ref 135–145)

## 2021-08-01 LAB — CBC
HCT: 37.8 % — ABNORMAL LOW (ref 39.0–52.0)
Hemoglobin: 12 g/dL — ABNORMAL LOW (ref 13.0–17.0)
MCH: 28.6 pg (ref 26.0–34.0)
MCHC: 31.7 g/dL (ref 30.0–36.0)
MCV: 90.2 fL (ref 80.0–100.0)
Platelets: 224 10*3/uL (ref 150–400)
RBC: 4.19 MIL/uL — ABNORMAL LOW (ref 4.22–5.81)
RDW: 12.8 % (ref 11.5–15.5)
WBC: 8.8 10*3/uL (ref 4.0–10.5)
nRBC: 0 % (ref 0.0–0.2)

## 2021-08-01 LAB — HIV ANTIBODY (ROUTINE TESTING W REFLEX): HIV Screen 4th Generation wRfx: NONREACTIVE

## 2021-08-01 NOTE — Progress Notes (Signed)
PROGRESS NOTE    Leslie Sims [facility]  [id number] DOB: 1951/06/30 DOA: 07/31/2021 PCP: [name], MD   Brief Narrative: Taken from H&P. Leslie Sims is a 70 y.o. 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 He stopped taking his COPD about a week ago, apparently developed some chest discomfort after using a new inhaler most likely Pulmicort, he called his pulmonologist and they recommended stopping that, he misunderstood and stopped all of his COPD inhalers and nebulizers.  Admitted for COPD exacerbation.  Subjective: Patient continues to feel little short of breath, stating it is improving but he does not feel at his baseline yet.  Per patient he understood wrong what his physician was trying to explain and stop using all his meds not just the new inhaler.  Saturating well on his baseline oxygen requirement of 2 to 3 L.  Assessment & Plan:   Principal Problem:   COPD exacerbation ([place]) Active Problems:   Hyperlipidemia   Essential hypertension   GERD   Sleep disorder   Acute respiratory failure with hypoxia (HCC)   COPD with acute exacerbation (HCC)   Generalized anxiety disorder  COPD exacerbation.  Saturating well on baseline oxygen requirement but still feeling little short of breath with mild scattered wheeze. -Continue with Solu-Medrol. -Continue with DuoNeb and supportive care -Continue with home home bronchodilators.  Hyperlipidemia. -Continue with home atorvastatin.  Depression/anxiety. -Continue with home dose of escitalopram and mirtazapine  Psych issues. -Continue with olanzapine  Hypertension.  Blood pressure within goal. -Continue with home dose of losartan and furosemide  History of CAD.  No acute concern. -Continue with home atorvastatin and  aspirin  Objective: Vitals:   08/01/21 0915 08/01/21 1154 08/01/21 1225 08/01/21 1521  BP: 138/63 (!) 108/54  129/74  Pulse: 99 66  74  Resp: '17 17  18  '$ Temp: 98.3 F (36.8 C) 98.3 F (36.8 C)  99.6 F (37.6 C)  TempSrc: Oral     SpO2: 94% 96% 95% 93%  Weight:      Height:        Intake/Output Summary (Last 24 hours) at 08/01/2021 1621 Last data filed at 08/01/2021 1409 Gross per 24 hour  Intake 300 ml  Output --  Net 300 ml   Filed Weights   07/31/21 1316  Weight: 90.7 kg    Examination:  General exam: Appears calm and comfortable  Respiratory system: Some scattered expiratory wheeze bilaterally, respiratory effort normal. Cardiovascular system: S1 & S2 heard, RRR.  Gastrointestinal system: Soft, nontender, nondistended, bowel sounds positive. Central nervous system: Alert and oriented. No focal neurological deficits.Symmetric 5 x 5 power. Extremities: No edema, no cyanosis, pulses intact and symmetrical. Psychiatry: Judgement and insight appear normal.   DVT prophylaxis: Lovenox Code Status: Full Family Communication: Discussed with patient Disposition Plan:  Status is: Inpatient  Remains inpatient appropriate because:Inpatient level of care appropriate due to severity of illness  Dispo: The patient is from: Home              Anticipated d/c is to: Home              Patient currently is not medically stable to d/c.   Difficult to place patient No              Level of care: Med-Surg  All the records are reviewed and  case discussed with Care Management/Social Worker. Management plans discussed with the patient, nursing and they are in agreement.  Consultants:  None  Procedures:  Antimicrobials:   Data Reviewed: I have personally reviewed following labs and imaging studies  CBC: Recent Labs  Lab 07/31/21 1320 08/01/21 0714  WBC 7.0 8.8  HGB 12.6* 12.0*  HCT 39.0 37.8*  MCV 93.3 90.2  PLT 208 [id number]   Basic Metabolic Panel: Recent Labs  Lab  07/31/21 1320 08/01/21 0714  NA 144 139  K 3.3* 3.5  CL 96* 94*  CO2 36* 32  GLUCOSE 102* 177*  BUN 12 16  CREATININE 0.98 0.99  CALCIUM 8.9 9.4   GFR: Estimated Creatinine Clearance: 82 mL/min (by C-G formula based on SCr of 0.99 mg/dL). Liver Function Tests: No results for input(s): AST, ALT, ALKPHOS, BILITOT, PROT, ALBUMIN in the last 168 hours. No results for input(s): LIPASE, AMYLASE in the last 168 hours. No results for input(s): AMMONIA in the last 168 hours. Coagulation Profile: No results for input(s): INR, PROTIME in the last 168 hours. Cardiac Enzymes: No results for input(s): CKTOTAL, CKMB, CKMBINDEX, TROPONINI in the last 168 hours. BNP (last 3 results) No results for input(s): PROBNP in the last 8760 hours. HbA1C: No results for input(s): HGBA1C in the last 72 hours. CBG: No results for input(s): GLUCAP in the last 168 hours. Lipid Profile: No results for input(s): CHOL, HDL, LDLCALC, TRIG, CHOLHDL, LDLDIRECT in the last 72 hours. Thyroid Function Tests: No results for input(s): TSH, T4TOTAL, FREET4, T3FREE, THYROIDAB in the last 72 hours. Anemia Panel: No results for input(s): VITAMINB12, FOLATE, FERRITIN, TIBC, IRON, RETICCTPCT in the last 72 hours. Sepsis Labs: Recent Labs  Lab 07/31/21 1553  PROCALCITON <0.10    Recent Results (from the past 240 hour(s))  Resp Panel by RT-PCR (Flu A&B, Covid) Nasopharyngeal Swab     Status: None   Collection Time: 07/31/21  2:31 PM   Specimen: Nasopharyngeal Swab; Nasopharyngeal(NP) swabs in vial transport medium  Result Value Ref Range Status   SARS Coronavirus 2 by RT PCR NEGATIVE NEGATIVE Final    Comment: (NOTE) SARS-CoV-2 target nucleic acids are NOT DETECTED.  The SARS-CoV-2 RNA is generally detectable in upper respiratory specimens during the acute phase of infection. The lowest concentration of SARS-CoV-2 viral copies this assay can detect is 138 copies/mL. A negative result does not preclude  SARS-Cov-2 infection and should not be used as the sole basis for treatment or other patient management decisions. A negative result may occur with  improper specimen collection/handling, submission of specimen other than nasopharyngeal swab, presence of viral mutation(s) within the areas targeted by this assay, and inadequate number of viral copies(<138 copies/mL). A negative result must be combined with clinical observations, patient history, and epidemiological information. The expected result is Negative.  Fact Sheet for Patients:  EntrepreneurPulse.com.au  Fact Sheet for Healthcare Providers:  IncredibleEmployment.be  This test is no t yet approved or cleared by the [place] FDA and  has been authorized for detection and/or diagnosis of SARS-CoV-2 by FDA under an Emergency Use Authorization (EUA). This EUA will remain  in effect (meaning this test can be used) for the duration of the COVID-19 declaration under Section 564(b)(1) of the Act, 21 U.S.C.section 360bbb-3(b)(1), unless the authorization is terminated  or revoked sooner.       Influenza A by PCR NEGATIVE NEGATIVE Final   Influenza B by PCR NEGATIVE NEGATIVE Final    Comment: (NOTE) The Xpert Xpress SARS-CoV-2/FLU/RSV plus  assay is intended as an aid in the diagnosis of influenza from Nasopharyngeal swab specimens and should not be used as a sole basis for treatment. Nasal washings and aspirates are unacceptable for Xpert Xpress SARS-CoV-2/FLU/RSV testing.  Fact Sheet for Patients: EntrepreneurPulse.com.au  Fact Sheet for Healthcare Providers: IncredibleEmployment.be  This test is not yet approved or cleared by the [place] FDA and has been authorized for detection and/or diagnosis of SARS-CoV-2 by FDA under an Emergency Use Authorization (EUA). This EUA will remain in effect (meaning this test can be used) for the duration of  the COVID-19 declaration under Section 564(b)(1) of the Act, 21 U.S.C. section 360bbb-3(b)(1), unless the authorization is terminated or revoked.  Performed at [facility], [address], [place], [place] [address]      Radiology Studies: DG Chest 2 View  Result Date: 07/31/2021 CLINICAL DATA:  Chest pain/shortness of breath. EXAM: CHEST - 2 VIEW COMPARISON:  Chest radiograph 05/04/2021; CT chest 01/21/2021 FINDINGS: The heart size and mediastinal contours are within normal limits. Subtle scarring in the right upper lobe is unchanged. No new focal consolidation, pleural effusion, or pneumothorax. Stable 1.0 cm nodule in the left upper lobe. Multilevel degenerative changes throughout the visualized mid and distal thoracic spine. Unchanged oval calcification overlying the right axilla. IMPRESSION: No acute cardiopulmonary abnormality. Electronically Signed   By: Ileana Roup M.D.   On: 07/31/2021 14:01    Scheduled Meds:  aspirin EC  81 mg Oral Daily   atorvastatin  80 mg Oral QHS   enoxaparin (LOVENOX) injection  40 mg Subcutaneous Q24H   escitalopram  40 mg Oral q morning   fluticasone  2 spray Each Nare Daily   furosemide  40 mg Oral Daily   ipratropium-albuterol  3 mL Nebulization QID   losartan  25 mg Oral Daily   methylPREDNISolone (SOLU-MEDROL) injection  40 mg Intravenous Q12H   mirtazapine  15 mg Oral QHS   mometasone-formoterol  2 puff Inhalation BID   OLANZapine  2.5 mg Oral QHS   pantoprazole  80 mg Oral Q1200   Continuous Infusions:   LOS: 0 days   Time spent: 35 minutes. More than 50% of the time was spent in counseling/coordination of care  Lorella Nimrod, MD [facility]  If 7PM-7AM, please contact night-coverage [email or web address]  08/01/2021, 4:21 PM   This record has been created using Systems analyst. Errors have been sought and corrected,but may not always be located. Such creation errors do not reflect on the standard of  care.

## 2021-08-02 MED ORDER — PREDNISONE 50 MG PO TABS: ORAL_TABLET | ORAL | 0 refills | Status: DC

## 2021-08-02 MED ORDER — AZITHROMYCIN 250 MG PO TABS: ORAL_TABLET | ORAL | 0 refills | Status: AC

## 2021-08-02 MED ORDER — AZITHROMYCIN 500 MG PO TABS
250.0000 mg | ORAL_TABLET | Freq: Every day | ORAL | Status: DC
Start: 2021-08-03 — End: 2021-08-02

## 2021-08-02 MED ORDER — AZITHROMYCIN 500 MG PO TABS
500.0000 mg | ORAL_TABLET | Freq: Every day | ORAL | Status: AC
  Administered 2021-08-02: 500 mg via ORAL
  Filled 2021-08-02: qty 1

## 2021-08-02 NOTE — Discharge Summary (Signed)
Physician Discharge Summary  Leslie Sims [facility] [id number] DOB: 05-13-1951 DOA: 07/31/2021  PCP: [name], MD  Admit date: 07/31/2021 Discharge date: 08/02/2021  Admitted From: Home Disposition: Home  Recommendations for Outpatient Follow-up:  Follow up with PCP in 1-2 weeks Follow-up with your pulmonologist in 1 week Please obtain BMP/CBC in one week Please follow up on the following pending results: None  Home Health: No Equipment/Devices: Home oxygen Discharge Condition: Stable CODE STATUS: Full Diet recommendation: Heart Healthy   Brief/Interim Summary: Lynx Murzyn is a 70 y.o. 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 He stopped taking his COPD about a week ago, apparently developed some chest discomfort after using a new inhaler most likely Pulmicort, he called his pulmonologist and they recommended stopping that, he misunderstood and stopped all of his COPD inhalers and nebulizers.   Admitted for COPD exacerbation.  Treated with IV steroids which were later transitioned to p.o.  He also received nebulizer treatments and resulted in improvement in his symptoms.  He was discharged home on 5 days of prednisone and Zithromax.  Stable on his baseline oxygen requirement of 2 to 3 L.  He was advised to have a close follow-up with his pulmonologist and continue taking his bronchodilators.  Will continue rest of his home medications and follow-up with his providers.  Discharge Diagnoses:  Principal Problem:   COPD exacerbation ([place]) Active Problems:   Hyperlipidemia   Essential hypertension   GERD   Sleep disorder   Acute respiratory failure with hypoxia (HCC)   COPD with acute exacerbation (HCC)   Generalized anxiety disorder   Discharge Instructions  Discharge  Instructions     Diet - low sodium heart healthy   Complete by: As directed    Discharge instructions   Complete by: As directed    It was pleasure taking care of you. You are being given steroids for 5 days, please take it as directed. You are also being given a Zithromax which is an antibiotic but also help to decrease the swelling in your lungs to help with COPD exacerbation, please take it as directed. Follow-up very closely with your pulmonologist for better control of your COPD and to adjust your inhalers.   Increase activity slowly   Complete by: As directed       Allergies as of 08/02/2021       Reactions   Prozac [fluoxetine Hcl] Shortness Of Breath   Effexor Xr [venlafaxine Hcl Er] Other (See Comments)   "Makes me feel funny"   Wellbutrin [bupropion] Other (See Comments)   "Makes me feel funny"        Medication List     TAKE these medications    AeroChamber MV inhaler Use as instructed   albuterol 108 (90 Base) MCG/ACT inhaler Commonly known as: VENTOLIN HFA Inhale 2 puffs into the lungs every 6 (six) hours as needed for wheezing or shortness of breath.   aspirin 81 MG tablet Take 81 mg by mouth daily.   atorvastatin 80 MG tablet Commonly known as: LIPITOR Take 1 tablet (80 mg total) by mouth daily.   azithromycin 250 MG tablet Commonly known as: Zithromax Z-Pak Take 2 tablets (500 mg) on  Day 1,  followed by 1 tablet (250 mg) once daily on Days 2 through 5.   escitalopram 20 MG tablet Commonly known as:  LEXAPRO Take 2 tablets by mouth every morning.   esomeprazole 40 MG capsule Commonly known as: NexIUM Take 1 capsule (40 mg total) by mouth daily.   fluticasone 50 MCG/ACT nasal spray Commonly known as: FLONASE Place 2 sprays into both nostrils daily.   furosemide 40 MG tablet Commonly known as: LASIX Take 1 tablet (40 mg total) by mouth daily.   ipratropium-albuterol 0.5-2.5 (3) MG/3ML Soln Commonly known as: DUONEB USE 1 VIAL VIA  NEBULIZER AND INHALE BY MOUTH 4 TIMES A DAY   loratadine 10 MG tablet Commonly known as: CLARITIN Take 10 mg by mouth daily.   LORazepam 1 MG tablet Commonly known as: ATIVAN Take 1 tablet (1 mg total) by mouth every 4 (four) hours while awake.   losartan 25 MG tablet Commonly known as: COZAAR Take 25 mg by mouth daily.   mirtazapine 15 MG tablet Commonly known as: REMERON Take 15 mg by mouth at bedtime.   multivitamin capsule Take 1 capsule by mouth daily.   nitroGLYCERIN 0.4 MG SL tablet Commonly known as: NITROSTAT Place 1 tablet (0.4 mg total) under the tongue every 5 (five) minutes as needed.   OLANZapine 2.5 MG tablet Commonly known as: ZYPREXA Take 2.5 mg by mouth at bedtime.   OXYGEN Inhale 2.5 L into the lungs daily. What changed: Another medication with the same name was removed. Continue taking this medication, and follow the directions you see here.   predniSONE 50 MG tablet Commonly known as: DELTASONE Take 1 tablet / 50 mg daily for 5 days   sucralfate 1 g tablet Commonly known as: CARAFATE Take 1 g by mouth 4 (four) times daily.        Follow-up Information     [name], MD. Schedule an appointment as soon as possible for a visit.   Specialty: Family Medicine Contact information: [place] [address] [phone]         Wellington Hampshire, MD .   Specialty: Cardiology Contact information: [place] [address] [phone]                Allergies  Allergen Reactions   Prozac [Fluoxetine Hcl] Shortness Of Breath   Effexor Xr [Venlafaxine Hcl Er] Other (See Comments)    "Makes me feel funny"   Wellbutrin [Bupropion] Other (See Comments)    "Makes me feel funny"    Consultations: None  Procedures/Studies: DG Chest 2 View  Result Date: 07/31/2021 CLINICAL DATA:  Chest pain/shortness of breath. EXAM: CHEST - 2 VIEW COMPARISON:  Chest radiograph 05/04/2021; CT  chest 01/21/2021 FINDINGS: The heart size and mediastinal contours are within normal limits. Subtle scarring in the right upper lobe is unchanged. No new focal consolidation, pleural effusion, or pneumothorax. Stable 1.0 cm nodule in the left upper lobe. Multilevel degenerative changes throughout the visualized mid and distal thoracic spine. Unchanged oval calcification overlying the right axilla. IMPRESSION: No acute cardiopulmonary abnormality. Electronically Signed   By: Ileana Roup M.D.   On: 07/31/2021 14:01    Subjective: Patient was seen and examined today.  Feeling better and would like to go home.  Was discussed about having a close follow-up with pulmonologist to adjust his COPD medications and do not stop taking it.  Discharge Exam: Vitals:   08/02/21 0541 08/02/21 0940  BP: 135/83 135/67  Pulse: 75 92  Resp: 16 16  Temp: 97.7 F (36.5 C) 98 F (36.7 C)  SpO2: 95% 96%  Vitals:   08/01/21 1938 08/01/21 1952 08/02/21 0541 08/02/21 0940  BP:  (!) 141/80 135/83 135/67  Pulse:  95 75 92  Resp:  '18 16 16  '$ Temp:  98.2 F (36.8 C) 97.7 F (36.5 C) 98 F (36.7 C)  TempSrc:      SpO2: 94% 92% 95% 96%  Weight:      Height:        General: Pt is alert, awake, not in acute distress Cardiovascular: RRR, S1/S2 +, no rubs, no gallops Respiratory: CTA bilaterally, very few scattered expiratory wheeze bilaterally. Abdominal: Soft, NT, ND, bowel sounds + Extremities: no edema, no cyanosis   The results of significant diagnostics from this hospitalization (including imaging, microbiology, ancillary and laboratory) are listed below for reference.    Microbiology: Recent Results (from the past 240 hour(s))  Resp Panel by RT-PCR (Flu A&B, Covid) Nasopharyngeal Swab     Status: None   Collection Time: 07/31/21  2:31 PM   Specimen: Nasopharyngeal Swab; Nasopharyngeal(NP) swabs in vial transport medium  Result Value Ref Range Status   SARS Coronavirus 2 by RT PCR NEGATIVE NEGATIVE  Final    Comment: (NOTE) SARS-CoV-2 target nucleic acids are NOT DETECTED.  The SARS-CoV-2 RNA is generally detectable in upper respiratory specimens during the acute phase of infection. The lowest concentration of SARS-CoV-2 viral copies this assay can detect is 138 copies/mL. A negative result does not preclude SARS-Cov-2 infection and should not be used as the sole basis for treatment or other patient management decisions. A negative result may occur with  improper specimen collection/handling, submission of specimen other than nasopharyngeal swab, presence of viral mutation(s) within the areas targeted by this assay, and inadequate number of viral copies(<138 copies/mL). A negative result must be combined with clinical observations, patient history, and epidemiological information. The expected result is Negative.  Fact Sheet for Patients:  EntrepreneurPulse.com.au  Fact Sheet for Healthcare Providers:  IncredibleEmployment.be  This test is no t yet approved or cleared by the [place] FDA and  has been authorized for detection and/or diagnosis of SARS-CoV-2 by FDA under an Emergency Use Authorization (EUA). This EUA will remain  in effect (meaning this test can be used) for the duration of the COVID-19 declaration under Section 564(b)(1) of the Act, 21 U.S.C.section 360bbb-3(b)(1), unless the authorization is terminated  or revoked sooner.       Influenza A by PCR NEGATIVE NEGATIVE Final   Influenza B by PCR NEGATIVE NEGATIVE Final    Comment: (NOTE) The Xpert Xpress SARS-CoV-2/FLU/RSV plus assay is intended as an aid in the diagnosis of influenza from Nasopharyngeal swab specimens and should not be used as a sole basis for treatment. Nasal washings and aspirates are unacceptable for Xpert Xpress SARS-CoV-2/FLU/RSV testing.  Fact Sheet for Patients: EntrepreneurPulse.com.au  Fact Sheet for Healthcare  Providers: IncredibleEmployment.be  This test is not yet approved or cleared by the [place] FDA and has been authorized for detection and/or diagnosis of SARS-CoV-2 by FDA under an Emergency Use Authorization (EUA). This EUA will remain in effect (meaning this test can be used) for the duration of the COVID-19 declaration under Section 564(b)(1) of the Act, 21 U.S.C. section 360bbb-3(b)(1), unless the authorization is terminated or revoked.  Performed at [facility], [address] [place], [place] [address]      Labs: BNP (last 3 results) Recent Labs    03/30/21 1406  BNP [id number]   Basic Metabolic Panel: Recent Labs  Lab 07/31/21 1320 08/01/21 0714  NA 144 139  K 3.3* 3.5  CL 96* 94*  CO2 36* 32  GLUCOSE 102* 177*  BUN 12 16  CREATININE 0.98 0.99  CALCIUM 8.9 9.4   Liver Function Tests: No results for input(s): AST, ALT, ALKPHOS, BILITOT, PROT, ALBUMIN in the last 168 hours. No results for input(s): LIPASE, AMYLASE in the last 168 hours. No results for input(s): AMMONIA in the last 168 hours. CBC: Recent Labs  Lab 07/31/21 1320 08/01/21 0714  WBC 7.0 8.8  HGB 12.6* 12.0*  HCT 39.0 37.8*  MCV 93.3 90.2  PLT 208 224   Cardiac Enzymes: No results for input(s): CKTOTAL, CKMB, CKMBINDEX, TROPONINI in the last 168 hours. BNP: Invalid input(s): POCBNP CBG: No results for input(s): GLUCAP in the last 168 hours. D-Dimer No results for input(s): DDIMER in the last 72 hours. Hgb A1c No results for input(s): HGBA1C in the last 72 hours. Lipid Profile No results for input(s): CHOL, HDL, LDLCALC, TRIG, CHOLHDL, LDLDIRECT in the last 72 hours. Thyroid function studies No results for input(s): TSH, T4TOTAL, T3FREE, THYROIDAB in the last 72 hours.  Invalid input(s): FREET3 Anemia work up No results for input(s): VITAMINB12, FOLATE, FERRITIN, TIBC, IRON, RETICCTPCT in the last 72 hours. Urinalysis    Component Value Date/Time    COLORURINE YELLOW (A) 10/18/2018 1943   APPEARANCEUR CLEAR (A) 10/18/2018 1943   APPEARANCEUR Clear 01/10/2013 1205   LABSPEC 1.009 10/18/2018 1943   LABSPEC 1.004 01/10/2013 1205   PHURINE 6.0 10/18/2018 1943   GLUCOSEU NEGATIVE 10/18/2018 1943   GLUCOSEU 50 mg/dL 01/10/2013 1205   [place] 10/18/2018 [place] 10/18/2018 1943   BILIRUBINUR negative 07/27/2017 [place] neg 05/10/2014 1208   BILIRUBINUR Negative 01/10/2013 [place] 10/18/2018 1943   PROTEINUR NEGATIVE 10/18/2018 1943   UROBILINOGEN 0.2 07/27/2017 1259   NITRITE NEGATIVE 10/18/2018 [place] 10/18/2018 1943   LEUKOCYTESUR Negative 01/10/2013 1205   Sepsis Labs Invalid input(s): PROCALCITONIN,  WBC,  LACTICIDVEN Microbiology Recent Results (from the past 240 hour(s))  Resp Panel by RT-PCR (Flu A&B, Covid) Nasopharyngeal Swab     Status: None   Collection Time: 07/31/21  2:31 PM   Specimen: Nasopharyngeal Swab; Nasopharyngeal(NP) swabs in vial transport medium  Result Value Ref Range Status   SARS Coronavirus 2 by RT PCR NEGATIVE NEGATIVE Final    Comment: (NOTE) SARS-CoV-2 target nucleic acids are NOT DETECTED.  The SARS-CoV-2 RNA is generally detectable in upper respiratory specimens during the acute phase of infection. The lowest concentration of SARS-CoV-2 viral copies this assay can detect is 138 copies/mL. A negative result does not preclude SARS-Cov-2 infection and should not be used as the sole basis for treatment or other patient management decisions. A negative result may occur with  improper specimen collection/handling, submission of specimen other than nasopharyngeal swab, presence of viral mutation(s) within the areas targeted by this assay, and inadequate number of viral copies(<138 copies/mL). A negative result must be combined with clinical observations, patient history, and epidemiological information. The expected result  is Negative.  Fact Sheet for Patients:  EntrepreneurPulse.com.au  Fact Sheet for Healthcare Providers:  IncredibleEmployment.be  This test is no t yet approved or cleared by the [place] FDA and  has been authorized for detection and/or diagnosis of SARS-CoV-2 by FDA under an Emergency Use Authorization (EUA). This EUA will remain  in effect (meaning this test can be used) for the duration of the COVID-19 declaration under Section  564(b)(1) of the Act, 21 U.S.C.section 360bbb-3(b)(1), unless the authorization is terminated  or revoked sooner.       Influenza A by PCR NEGATIVE NEGATIVE Final   Influenza B by PCR NEGATIVE NEGATIVE Final    Comment: (NOTE) The Xpert Xpress SARS-CoV-2/FLU/RSV plus assay is intended as an aid in the diagnosis of influenza from Nasopharyngeal swab specimens and should not be used as a sole basis for treatment. Nasal washings and aspirates are unacceptable for Xpert Xpress SARS-CoV-2/FLU/RSV testing.  Fact Sheet for Patients: EntrepreneurPulse.com.au  Fact Sheet for Healthcare Providers: IncredibleEmployment.be  This test is not yet approved or cleared by the [place] FDA and has been authorized for detection and/or diagnosis of SARS-CoV-2 by FDA under an Emergency Use Authorization (EUA). This EUA will remain in effect (meaning this test can be used) for the duration of the COVID-19 declaration under Section 564(b)(1) of the Act, 21 U.S.C. section 360bbb-3(b)(1), unless the authorization is terminated or revoked.  Performed at [facility], [address] [place], [place] [address]     Time coordinating discharge: Over 30 minutes  SIGNED:  Lorella Nimrod, MD  [facility] 08/02/2021, 12:00 PM  If 7PM-7AM, please contact night-coverage [email or web address]  This record has been created using Systems analyst. Errors have been  sought and corrected,but may not always be located. Such creation errors do not reflect on the standard of care.

## 2021-08-28 ENCOUNTER — Telehealth: Payer: Self-pay | Admitting: Pulmonary Disease

## 2021-08-28 NOTE — Telephone Encounter (Signed)
Spoke to patient via telephone.  C/o increased sob with exertion, wheezing and mild dry cough x1w Denied f/c/s or additional sx. He is taking Pulmicort BID,  duoneb QID and ventolin HFA 3-4x with no relief. According to 07/21/2021 phone note, he was to d/c pulmicort. Patient stated that pulmicort was restarted during recent admission. He wears 2L cont. Spo2 is maintaining between 93-97%.   No recent covid test.  Fully vaccinated against covid.   Dr. Patsey Berthold, please advise. Thanks

## 2021-08-28 NOTE — Telephone Encounter (Signed)
Patient is aware of below message and voiced her understanding.  Nothing further needed at this time.   

## 2021-08-28 NOTE — Telephone Encounter (Signed)
He will likely need to be seen in the emergency room as it looks like he is maximally treated for COPD.  His symptoms may be due to his heart.  He does have some issues with weakening of the heart.  I am concerned that the heart may be playing a part in this.

## 2021-09-04 ENCOUNTER — Other Ambulatory Visit: Payer: Self-pay | Admitting: Pulmonary Disease

## 2021-09-11 ENCOUNTER — Other Ambulatory Visit: Payer: Self-pay | Admitting: Pulmonary Disease

## 2021-09-21 ENCOUNTER — Other Ambulatory Visit: Payer: Self-pay | Admitting: *Deleted

## 2021-09-21 DIAGNOSIS — C61 Malignant neoplasm of prostate: Secondary | ICD-10-CM

## 2021-09-22 ENCOUNTER — Ambulatory Visit: Payer: Medicare Other | Admitting: Urology

## 2021-10-14 ENCOUNTER — Inpatient Hospital Stay: Payer: Medicare Other

## 2021-10-14 ENCOUNTER — Ambulatory Visit: Payer: Medicare Other | Admitting: Radiation Oncology

## 2021-10-14 ENCOUNTER — Telehealth: Payer: Self-pay | Admitting: Radiation Oncology

## 2021-10-14 NOTE — Telephone Encounter (Signed)
Pt called to cancel appt for today. Will call to reschedule.

## 2021-10-19 ENCOUNTER — Emergency Department: Payer: Medicare Other

## 2021-10-19 ENCOUNTER — Other Ambulatory Visit: Payer: Self-pay

## 2021-10-19 DIAGNOSIS — J441 Chronic obstructive pulmonary disease with (acute) exacerbation: Secondary | ICD-10-CM | POA: Insufficient documentation

## 2021-10-19 DIAGNOSIS — Z7951 Long term (current) use of inhaled steroids: Secondary | ICD-10-CM | POA: Diagnosis not present

## 2021-10-19 DIAGNOSIS — I5022 Chronic systolic (congestive) heart failure: Secondary | ICD-10-CM | POA: Diagnosis not present

## 2021-10-19 DIAGNOSIS — Z951 Presence of aortocoronary bypass graft: Secondary | ICD-10-CM | POA: Insufficient documentation

## 2021-10-19 DIAGNOSIS — Z7982 Long term (current) use of aspirin: Secondary | ICD-10-CM | POA: Insufficient documentation

## 2021-10-19 DIAGNOSIS — Z8546 Personal history of malignant neoplasm of prostate: Secondary | ICD-10-CM | POA: Insufficient documentation

## 2021-10-19 DIAGNOSIS — Z79899 Other long term (current) drug therapy: Secondary | ICD-10-CM | POA: Diagnosis not present

## 2021-10-19 DIAGNOSIS — Z85038 Personal history of other malignant neoplasm of large intestine: Secondary | ICD-10-CM | POA: Diagnosis not present

## 2021-10-19 DIAGNOSIS — I2510 Atherosclerotic heart disease of native coronary artery without angina pectoris: Secondary | ICD-10-CM | POA: Insufficient documentation

## 2021-10-19 DIAGNOSIS — Z87891 Personal history of nicotine dependence: Secondary | ICD-10-CM | POA: Diagnosis not present

## 2021-10-19 DIAGNOSIS — R0602 Shortness of breath: Secondary | ICD-10-CM | POA: Diagnosis present

## 2021-10-19 DIAGNOSIS — I110 Hypertensive heart disease with heart failure: Secondary | ICD-10-CM | POA: Insufficient documentation

## 2021-10-19 LAB — CBC WITH DIFFERENTIAL/PLATELET
Abs Immature Granulocytes: 0.05 10*3/uL (ref 0.00–0.07)
Basophils Absolute: 0 10*3/uL (ref 0.0–0.1)
Basophils Relative: 1 %
Eosinophils Absolute: 0.2 10*3/uL (ref 0.0–0.5)
Eosinophils Relative: 3 %
HCT: 40.5 % (ref 39.0–52.0)
Hemoglobin: 13.3 g/dL (ref 13.0–17.0)
Immature Granulocytes: 1 %
Lymphocytes Relative: 15 %
Lymphs Abs: 1.1 10*3/uL (ref 0.7–4.0)
MCH: 30.2 pg (ref 26.0–34.0)
MCHC: 32.8 g/dL (ref 30.0–36.0)
MCV: 91.8 fL (ref 80.0–100.0)
Monocytes Absolute: 0.9 10*3/uL (ref 0.1–1.0)
Monocytes Relative: 12 %
Neutro Abs: 5.1 10*3/uL (ref 1.7–7.7)
Neutrophils Relative %: 68 %
Platelets: 258 10*3/uL (ref 150–400)
RBC: 4.41 MIL/uL (ref 4.22–5.81)
RDW: 12.8 % (ref 11.5–15.5)
WBC: 7.3 10*3/uL (ref 4.0–10.5)
nRBC: 0 % (ref 0.0–0.2)

## 2021-10-19 LAB — COMPREHENSIVE METABOLIC PANEL
ALT: 31 U/L (ref 0–44)
AST: 22 U/L (ref 15–41)
Albumin: 4 g/dL (ref 3.5–5.0)
Alkaline Phosphatase: 75 U/L (ref 38–126)
Anion gap: 7 (ref 5–15)
BUN: 14 mg/dL (ref 8–23)
CO2: 35 mmol/L — ABNORMAL HIGH (ref 22–32)
Calcium: 9.2 mg/dL (ref 8.9–10.3)
Chloride: 97 mmol/L — ABNORMAL LOW (ref 98–111)
Creatinine, Ser: 1.1 mg/dL (ref 0.61–1.24)
GFR, Estimated: >60 mL/min (ref >60)
Glucose, Bld: 108 mg/dL — ABNORMAL HIGH (ref 70–99)
Potassium: 3.9 mmol/L (ref 3.5–5.1)
Sodium: 139 mmol/L (ref 135–145)
Total Bilirubin: 0.7 mg/dL (ref 0.3–1.2)
Total Protein: 7 g/dL (ref 6.5–8.1)

## 2021-10-19 LAB — TROPONIN I (HIGH SENSITIVITY)
Troponin I (High Sensitivity): 8 ng/L (ref <18)
Troponin I (High Sensitivity): 11 ng/L (ref <18)

## 2021-10-19 LAB — BRAIN NATRIURETIC PEPTIDE: B Natriuretic Peptide: 30.1 pg/mL (ref 0.0–100.0)

## 2021-10-19 IMAGING — CR DG CHEST 2V
2 series · 2 of 2 positions shown · non-contrast
Comparison: Chest radiograph [DATE], chest CT [DATE]

CLINICAL DATA: Cough and shortness of breath.

EXAM:
CHEST - 2 VIEW

[chest pa]
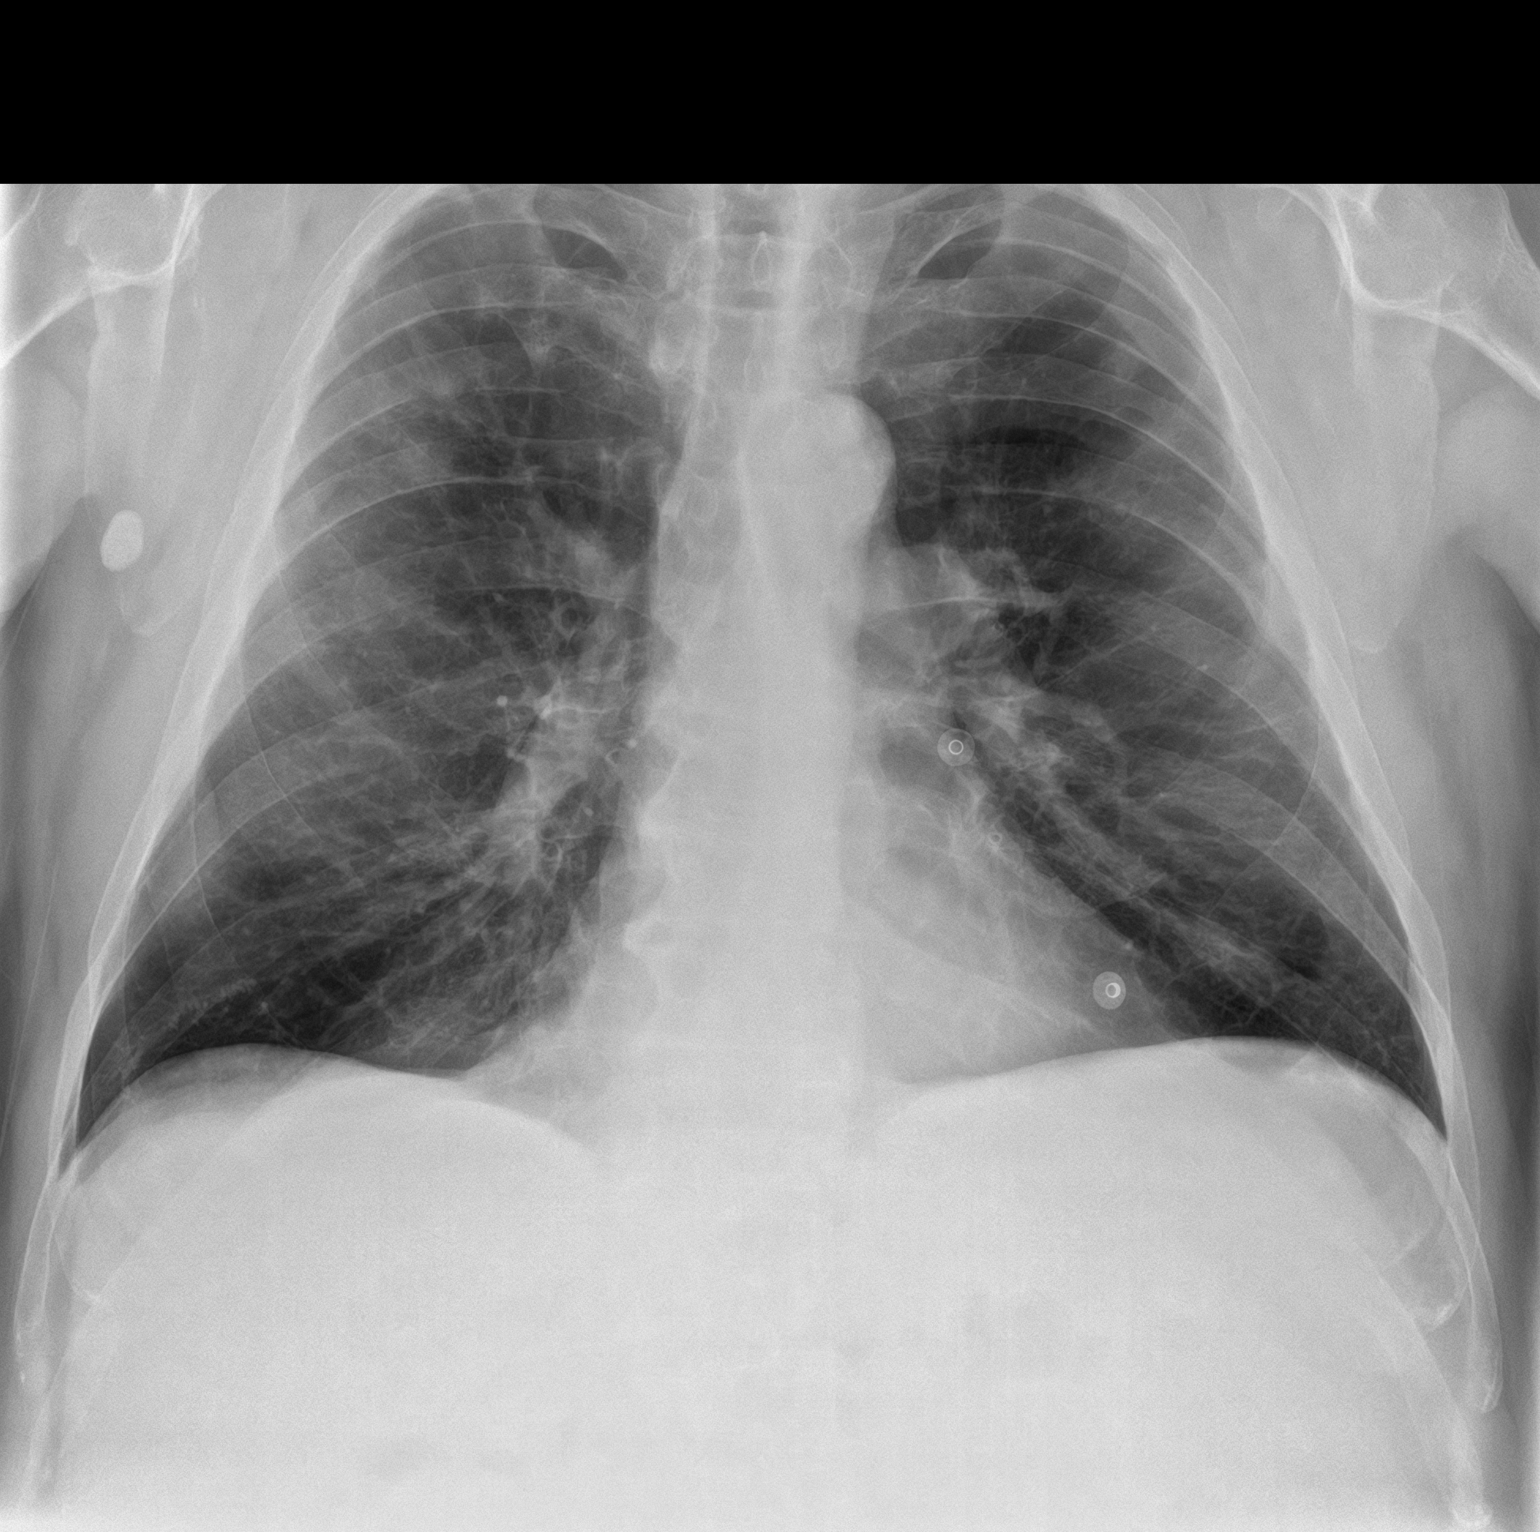

[chest lat]
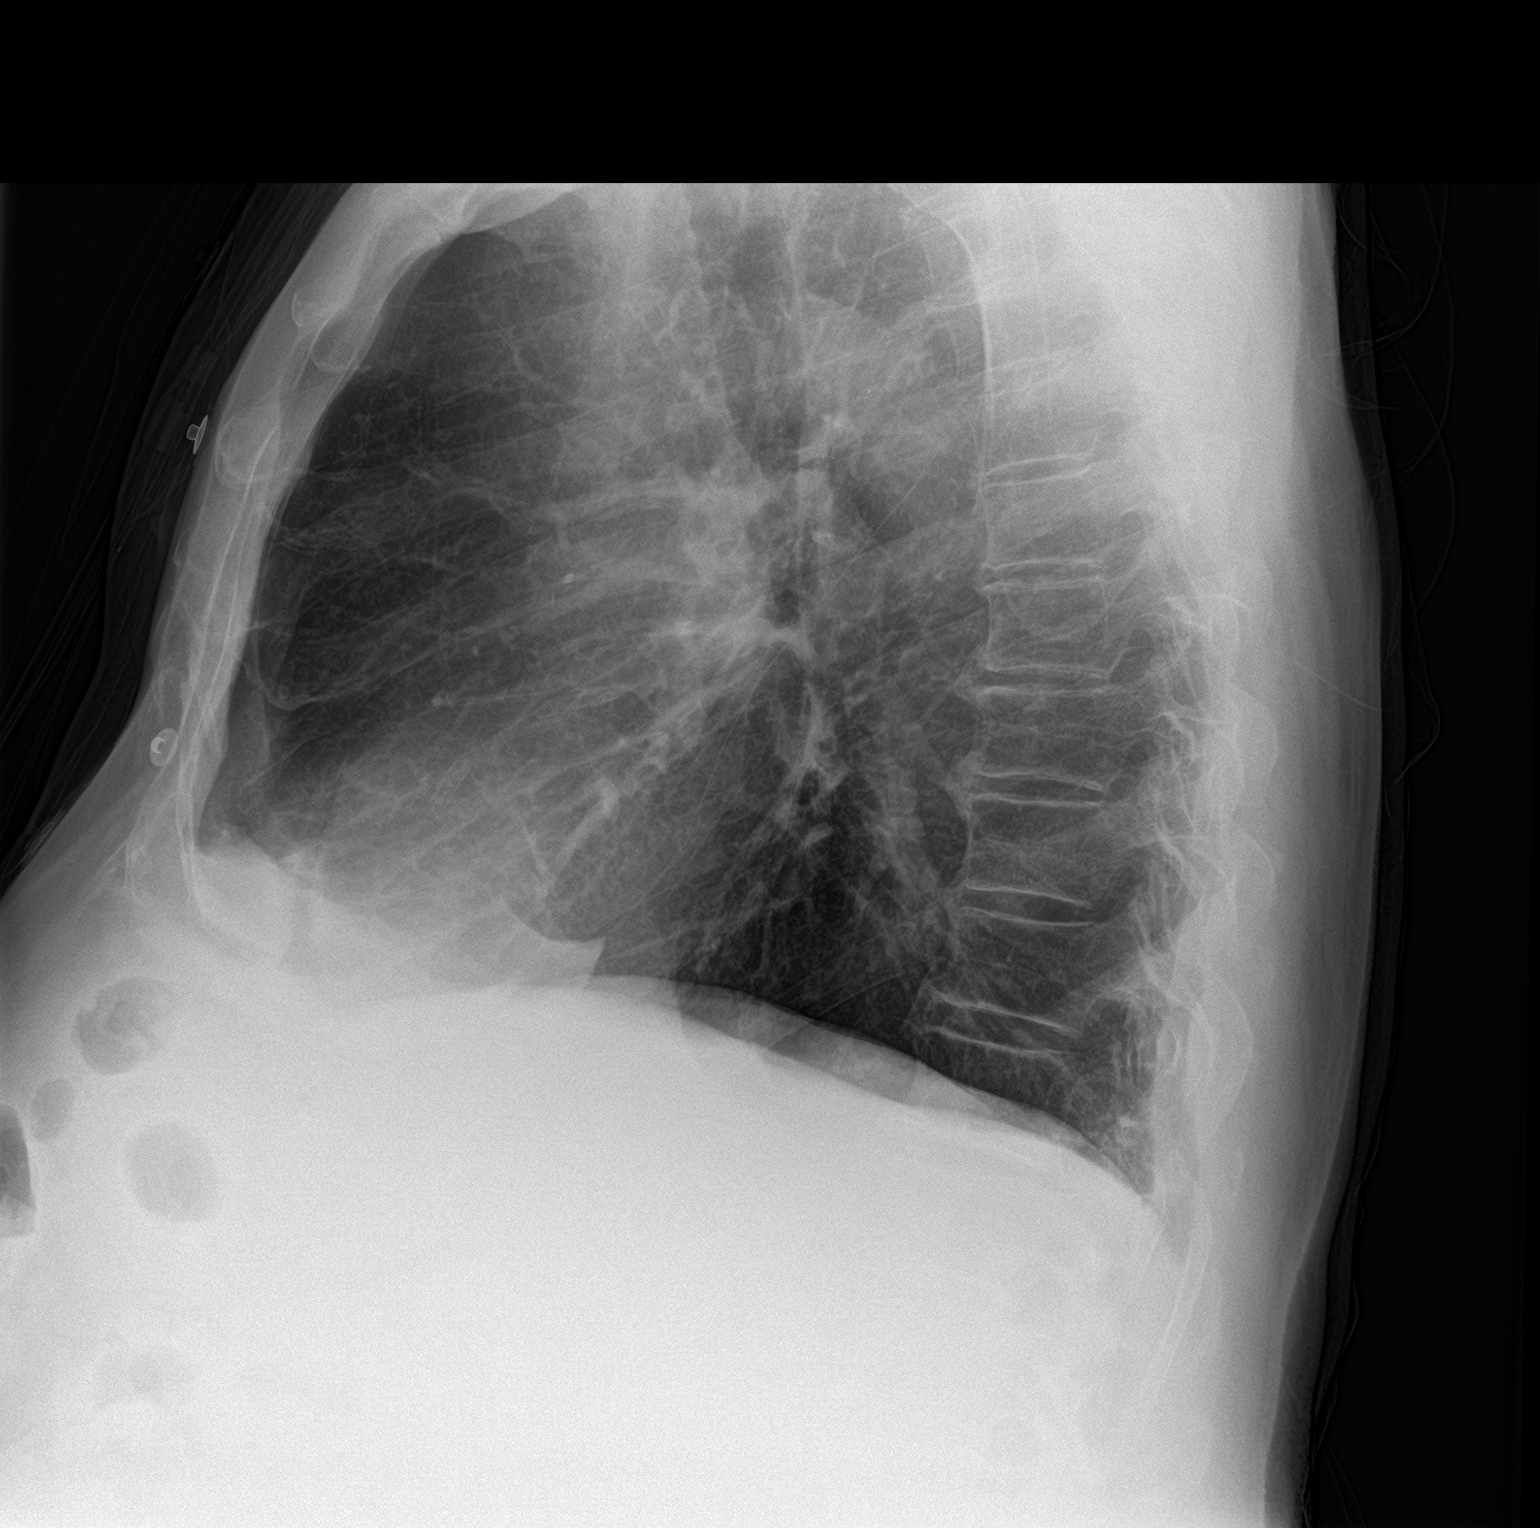

[2 of 2 positions shown; findings below may reference images not displayed]

FINDINGS: Emphysema with chronic hyperinflation. Nodular densities in the
right upper lung zone correspond to nodular areas of scarring on
prior chest CT, and are unchanged from prior exam. Left upper lobe
pulmonary nodule is also stable. There is no acute airspace disease.
Normal heart size and mediastinal contours. Coronary stent is seen.
No pulmonary edema, pleural effusion, or pneumothorax. Calcified
right axillary density is chronic. No acute osseous abnormalities
are seen.
IMPRESSION: 1. No acute chest findings.
2. Stable chronic findings of hyperinflation and pulmonary scarring.

## 2021-10-19 NOTE — ED Provider Notes (Signed)
Emergency Medicine Provider Triage Evaluation Note  Leslie Sims [facility] , a 70 y.o. male  was evaluated in triage.  Pt complains of with history of COPD and prior CABG, presents to the emergency department with left-sided chest pain and shortness of breath.  Patient reports that the symptoms have been ongoing for the past 2 weeks but worsened acutely today.  He is on 2-1/2 L 24 hours a day and reports that his last admission for COPD was 1 to 2 months ago.  He has been afebrile at home.  No new increased oxygen demands.  Review of Systems  Positive: Patient has chest pain and SOB  Negative: No abdominal pain, fever or vomiting  Physical Exam  BP (!) 143/79   Pulse 83   Temp 98.6 F (37 C) (Oral)   Resp (!) 22   Ht 5\' 10"  (1.778 m)   Wt 91 kg   SpO2 100%   BMI 28.79 kg/m  Gen:   Awake, no distress   Resp:  Patient has diminished breath sounds in the lung bases and is mildly tachypneic. MSK:   Moves extremities without difficulty  Other:    Medical Decision Making  Medically screening exam initiated at 3:27 PM.  Appropriate orders placed.  Leslie Sims was informed that the remainder of the evaluation will be completed by another provider, this initial triage assessment does not replace that evaluation, and the importance of remaining in the ED until their evaluation is complete.     Vallarie Mare [place], PA-C 10/19/21 1529    Duffy Bruce, MD 10/20/21 [phone]

## 2021-10-19 NOTE — ED Triage Notes (Signed)
Pt to ED for shob and left sided chest pain for a few weeks. Denies radiation. Denies nausea vomiting. Hx COPD NAD noted

## 2021-10-20 ENCOUNTER — Emergency Department
Admission: EM | Admit: 2021-10-20 | Discharge: 2021-10-20 | Disposition: A | Discharge status: Home / Self Care | Payer: Medicare Other | Attending: Emergency Medicine | Admitting: Emergency Medicine

## 2021-10-20 DIAGNOSIS — J441 Chronic obstructive pulmonary disease with (acute) exacerbation: Secondary | ICD-10-CM | POA: Diagnosis not present

## 2021-10-20 MED ORDER — PREDNISONE 50 MG PO TABS: 50.0000 mg | ORAL_TABLET | Freq: Every day | ORAL | 0 refills | Status: AC

## 2021-10-20 MED ORDER — IPRATROPIUM-ALBUTEROL 0.5-2.5 (3) MG/3ML IN SOLN
3.0000 mL | Freq: Once | RESPIRATORY_TRACT | Status: AC
  Administered 2021-10-20: 3 mL via RESPIRATORY_TRACT
  Filled 2021-10-20: qty 3

## 2021-10-20 MED ORDER — METHYLPREDNISOLONE SODIUM SUCC 125 MG IJ SOLR
125.0000 mg | Freq: Once | INTRAMUSCULAR | Status: AC
  Administered 2021-10-20: 125 mg via INTRAVENOUS
  Filled 2021-10-20: qty 2

## 2021-10-20 MED ORDER — MAGNESIUM SULFATE 2 GM/50ML IV SOLN
2.0000 g | Freq: Once | INTRAVENOUS | Status: AC
  Administered 2021-10-20: 2 g via INTRAVENOUS
  Filled 2021-10-20: qty 50

## 2021-10-20 MED ORDER — PREDNISONE 50 MG PO TABS: 50.0000 mg | ORAL_TABLET | Freq: Every day | ORAL | 0 refills | Status: DC

## 2021-10-20 NOTE — ED Provider Notes (Signed)
[facility] Emergency Department Provider Note   ____________________________________________    I have reviewed the triage vital signs and the nursing notes.   HISTORY  Chief Complaint Shortness of breath    HPI Leslie Sims is a 70 y.o. male with a history of COPD, CAD, CHF who presents with complaints of shortness of breath.  Patient reports that he thinks his COPD is "acting up ".  He reports this is been ongoing for about 2 weeks ever since he cleaned his ceiling fan and inhaled dust.  He does use 2.5 L home O2 24/7.  No fevers chills or significant cough, some mild chest tightness associated with his COPD.  No leg swelling.  Past Medical History:  Diagnosis Date   Allergic rhinitis    Anxiety    Back pain    Cancer ([place])    prostate   Colon polyps 08/2012   colonoscopy; multiple colon polyps; repeat colonoscopy in one year.   COPD (chronic obstructive pulmonary disease) ([place])    a. on home O2.   Coronary artery disease 11/2009   a. late presenting ant MI; b. LHC 100% pLAD s/p PCI/DES, 99% mRCA s/p PCI/DES, EF 35%; c. nuclear stress test 05/13: prior ant/inf infarcts w/o ischemia, EF 43%; d. LHC 02/14: widely patent stents with no other obs dz, EF 40%; e. LHC 11/17: LM nl, mLAD 10%, patent LAD stent, dLAD 20%, p-mRCA 10%, mRCA 40%, patent RCA stent, EF 35-45%    Depression    GERD (gastroesophageal reflux disease)    OXBDZHGD(924.2)    Helicobacter pylori (H. pylori)    Hemorrhoids    Hernia    inguinal   HFrEF (heart failure with reduced ejection fraction) ([place])    a. 10/2016 LV gram: EF 35-45%; b. 06/2018 Echo: EF 40-45%. c. 01/2021 Echo: EF 40-45%   Hyperlipidemia    Hypertension    Insomnia    Ischemic cardiomyopathy    a. 10/2016 LV gram: EF 35-45%; b. 06/2018 Echo: EF 40-45%.   Myalgia    Peptic ulcer    Pulmonary nodules    ST elevation (STEMI) myocardial infarction involving left anterior descending coronary artery ([place])  11/2009   a. s/p PCI to the LAD   Status post dilation of esophageal narrowing 2000   Tinea pedis    Wears dentures    partial upper    Patient Active Problem List   Diagnosis Date Noted   COPD exacerbation ([place]) 07/31/2021   Prostate cancer ([place]) 10/11/2020   Elevated PSA 07/28/2020   Iron deficiency anemia 05/20/2020   Goals of care, counseling/discussion    Palliative care by specialist    Healthcare-associated pneumonia 01/28/2019   COPD with acute exacerbation ([place]) 12/31/2018   Generalized anxiety disorder 11/12/2018   Moderate episode of recurrent major depressive disorder ([place]) 11/12/2018   Acute respiratory failure with hypoxia ([place]) 09/29/2018   Acute respiratory failure with hypoxia and hypercapnia ([place]) 09/13/2018   Acute on chronic respiratory failure with hypoxia and hypercapnia ([place]) 08/24/2018   Chest pain 07/02/2018   Grief reaction 01/13/2018   COPD with hypoxia ([place]) 12/25/2017   Unstable angina (HCC)    Benign neoplasm of ascending colon    Benign neoplasm of descending colon    Benign neoplasm of sigmoid colon    History of colonic polyps    Pulmonary nodules/lesions, multiple 01/05/2015   Bruit of left carotid artery 11/04/2014   Sleep disorder 07/18/2013   Seasonal and perennial allergic  rhinitis 05/29/2013   Bradycardia 04/20/2011   SMOKER 07/30/2010   CAD, NATIVE VESSEL 04/23/2010   Hyperlipidemia 03/24/2010   DEPRESSION/ANXIETY 73/71/0626   Chronic systolic heart failure ([place]) 03/24/2010   COPD, very severe ([place]) 03/24/2010   GERD 03/24/2010   Essential hypertension 11/21/2009    Past Surgical History:  Procedure Laterality Date   Admission  12/20/2012   COPD exacerbation.  [place].   CARDIAC CATHETERIZATION     CARDIAC CATHETERIZATION  02/05/13   [facility]   CARDIAC CATHETERIZATION  10/14   [facility] : patent stents with no change in anatomy. EF: 40$   CARDIAC CATHETERIZATION Left 11/12/2016   Procedure: Left Heart Cath and Coronary Angiography;   Surgeon: Wellington [place], MD;  Location: [facility];  Service: Cardiovascular;  Laterality: Left;   COLONOSCOPY     COLONOSCOPY WITH PROPOFOL N/A 05/18/2016   Procedure: COLONOSCOPY WITH PROPOFOL;  Surgeon: Lucilla Lame, MD;  Location: [facility] ENDOSCOPY;  Service: Endoscopy;  Laterality: N/A;   COLONOSCOPY WITH PROPOFOL N/A 03/19/2020   Procedure: COLONOSCOPY WITH PROPOFOL;  Surgeon: Lin Landsman, MD;  Location: [facility] ENDOSCOPY;  Service: Gastroenterology;  Laterality: N/A;   CORONARY ANGIOPLASTY WITH STENT PLACEMENT  09/2009   LAD 3.0 X23 mm Xience DES, RCA: 4.0 X 15 mm Xience DES   ELECTROMAGNETIC NAVIGATION BROCHOSCOPY N/A 10/27/2017   Procedure: ELECTROMAGNETIC NAVIGATION BRONCHOSCOPY;  Surgeon: Flora Lipps, MD;  Location: [facility] ORS;  Service: Cardiopulmonary;  Laterality: N/A;   ESOPHAGEAL DILATION     ESOPHAGOGASTRODUODENOSCOPY  2008   ESOPHAGOGASTRODUODENOSCOPY  08/20/2012   HERNIA REPAIR  07/20/2012   L inguinal hernia repair   PENILE PROSTHESIS IMPLANT      Prior to Admission medications   Medication Sig Start Date End Date Taking? Authorizing Provider  albuterol (VENTOLIN HFA) 108 (90 Base) MCG/ACT inhaler Inhale 2 puffs into the lungs every 6 (six) hours as needed for wheezing or shortness of breath. 05/04/21   Margarette Canada, NP  aspirin 81 MG tablet Take 81 mg by mouth daily.    [provider]  atorvastatin (LIPITOR) 80 MG tablet Take 1 tablet (80 mg total) by mouth daily. 03/18/21 06/16/21  Rise Mu, PA-C  azithromycin (ZITHROMAX) 250 MG tablet TAKE ONE TABLET BY MOUTH THREE TIMES A WEEK 09/11/21   [name], MD  escitalopram (LEXAPRO) 20 MG tablet Take 2 tablets by mouth every morning. 04/28/21   [provider]  esomeprazole (NEXIUM) 40 MG capsule TAKE ONE CAPSULE BY MOUTH ONCE DAILY 09/04/21   [name], MD  fluticasone [facility]) 50 MCG/ACT nasal spray Place 2 sprays into both nostrils daily. 06/08/21   [provider]   furosemide (LASIX) 40 MG tablet Take 1 tablet (40 mg total) by mouth daily. 02/06/21 02/01/22  Theora Gianotti, NP  ipratropium-albuterol (DUONEB) 0.5-2.5 (3) MG/3ML SOLN USE 1 VIAL VIA NEBULIZER AND INHALE BY MOUTH 4 TIMES A DAY 02/20/21   [name], MD  loratadine (CLARITIN) 10 MG tablet Take 10 mg by mouth daily. 06/08/21   [provider]  LORazepam (ATIVAN) 1 MG tablet Take 1 tablet (1 mg total) by mouth every 4 (four) hours while awake. 02/14/19   [name], MD  losartan (COZAAR) 25 MG tablet Take 25 mg by mouth daily. 02/23/19   [provider]  mirtazapine (REMERON) 15 MG tablet Take 15 mg by mouth at bedtime. 06/02/20   [provider]  Multiple Vitamin (MULTIVITAMIN) capsule Take 1 capsule by mouth daily.  [provider]  nitroGLYCERIN (NITROSTAT) 0.4 MG SL tablet Place 1 tablet (0.4 mg total) under the tongue every 5 (five) minutes as needed. 06/04/20   Loel Dubonnet, NP  OLANZapine (ZYPREXA) 2.5 MG tablet Take 2.5 mg by mouth at bedtime.    [provider]  OXYGEN Inhale 2.5 L into the lungs daily.    [provider]  predniSONE (DELTASONE) 50 MG tablet Take 1 tablet (50 mg total) by mouth daily with breakfast for 5 days. 10/20/21 10/25/21  Lavonia Drafts, MD  Spacer/Aero-Holding Chambers (AEROCHAMBER MV) inhaler Use as instructed 05/04/21   Margarette Canada, NP  sucralfate (CARAFATE) 1 g tablet Take 1 g by mouth 4 (four) times daily. 06/03/20   [provider]     Allergies Prozac [fluoxetine hcl], Effexor xr [venlafaxine hcl er], and Wellbutrin [bupropion]  Family History  Problem Relation Age of Onset   Heart attack Brother        Brother #1   Diabetes Brother    Hypertension Brother        #3   Coronary artery disease Father 8       deceased   Heart attack Father    Diabetes Father    Heart disease Father    COPD Mother 81       deceased   Alcohol abuse Sister        polysubstance abuse    COPD Sister    Lung cancer Sister    Alcohol abuse Sister        polysubstance abuse   Penile cancer Brother    Diabetes Brother    Prostate cancer Neg Hx    Bladder Cancer Neg Hx    Kidney cancer Neg Hx     Social History Social History   Tobacco Use   Smoking status: Former    Packs/day: 3.00    Years: 42.00    Pack years: 126.00    Types: Cigarettes    Quit date: 02/10/2019    Years since quitting: 2.6   Smokeless tobacco: Never   Tobacco comments:    25 months ago most smoked 4 packs a day .  Vaping Use   Vaping Use: Never used  Substance Use Topics   Alcohol use: No    Alcohol/week: 0.0 standard drinks   Drug use: No    Review of Systems  Constitutional: No fever/chills Eyes: No visual changes.  ENT: No sore throat. Cardiovascular: As above Respiratory: As above Gastrointestinal: No abdominal pain.  No nausea, no vomiting.   Genitourinary: Negative for dysuria. Musculoskeletal: Negative for back pain. Skin: Negative for rash. Neurological: Negative for headaches or weakness   ____________________________________________   PHYSICAL EXAM:  VITAL SIGNS: ED Triage Vitals  Enc Vitals Group     BP 10/19/21 1524 (!) 143/79     Pulse Rate 10/19/21 1524 83     Resp 10/19/21 1524 (!) 22     Temp 10/19/21 1524 98.6 F (37 C)     Temp Source 10/19/21 1524 Oral     SpO2 10/19/21 1524 100 %     Weight 10/19/21 1525 91 kg (200 lb 9.9 oz)     Height 10/19/21 1525 1.778 m (5\' 10" )     Head Circumference --      Peak Flow --      Pain Score 10/19/21 1525 3     Pain Loc --      Pain Edu? --      Excl. in  GC? --     Constitutional: Alert and oriented. No acute distress.  Eyes: Conjunctivae are normal.  Head: Atraumatic. Nose: No congestion/rhinnorhea. Mouth/Throat: Mucous membranes are moist.    Cardiovascular: Normal rate, regular rhythm. Grossly normal heart sounds.  Good peripheral circulation. Respiratory: Mildly increased respiratory effort with  tachypnea, no retractions, diffuse wheezing, moderate airflow Gastrointestinal: Soft and nontender. No distention.   Genitourinary: deferred Musculoskeletal: No lower extremity tenderness nor edema.  Warm and well perfused Neurologic:  Normal speech and language. No gross focal neurologic deficits are appreciated.  Skin:  Skin is warm, dry and intact. No rash noted. Psychiatric: Mood and affect are normal. Speech and behavior are normal.  ____________________________________________   LABS (all labs ordered are listed, but only abnormal results are displayed)  Labs Reviewed  COMPREHENSIVE METABOLIC PANEL - Abnormal; Notable for the following components:      Result Value   Chloride 97 (*)    CO2 35 (*)    Glucose, Bld 108 (*)    All other components within normal limits  CBC WITH DIFFERENTIAL/PLATELET  BRAIN NATRIURETIC PEPTIDE  TROPONIN I (HIGH SENSITIVITY)  TROPONIN I (HIGH SENSITIVITY)   ____________________________________________  EKG  ED ECG REPORT I, Lavonia Drafts, the attending physician, personally viewed and interpreted this ECG.  Date: 10/20/2021  Rhythm: normal sinus rhythm QRS Axis: normal Intervals: normal ST/T Wave abnormalities: Abnormal Narrative Interpretation: Unchanged from prior  ____________________________________________  RADIOLOGY  Chest x-ray reviewed by me, no acute abnormalities ____________________________________________   PROCEDURES  Procedure(s) performed: No  Procedures   Critical Care performed: No ____________________________________________   INITIAL IMPRESSION / ASSESSMENT AND PLAN / ED COURSE  Pertinent labs & imaging results that were available during my care of the patient were reviewed by me and considered in my medical decision making (see chart for details).   Patient presents with shortness of breath as detailed above, exam is most consistent with COPD exacerbation.  Patient will be treated with IV Solu-Medrol,  IV magnesium, DuoNeb's  Lab work is overall reassuring, chronically elevated CO2, normal delta troponin  Chest x-ray without evidence of pneumonia or pneumothorax  After 3 DuoNeb's patient feeling significantly improved, oxygen saturation on his baseline O2 level is 95 to 96%.  Offered admission however the patient declined states that he will return if any worsening  Strict turn precautions discussed, prednisone prescription provided, q4-hour albuterol nebulizers recommended    ____________________________________________   FINAL CLINICAL IMPRESSION(S) / ED DIAGNOSES  Final diagnoses:  COPD exacerbation ([place])        Note:  This document was prepared using Dragon voice recognition software and may include unintentional dictation errors.    Lavonia Drafts, MD 10/20/21 1158

## 2021-11-05 ENCOUNTER — Other Ambulatory Visit: Payer: Self-pay | Admitting: *Deleted

## 2021-11-16 ENCOUNTER — Inpatient Hospital Stay: Payer: Medicare Other | Attending: Radiation Oncology

## 2021-11-16 ENCOUNTER — Other Ambulatory Visit: Payer: Self-pay

## 2021-11-16 DIAGNOSIS — C61 Malignant neoplasm of prostate: Secondary | ICD-10-CM

## 2021-11-16 DIAGNOSIS — R918 Other nonspecific abnormal finding of lung field: Secondary | ICD-10-CM | POA: Diagnosis present

## 2021-11-16 DIAGNOSIS — R972 Elevated prostate specific antigen [PSA]: Secondary | ICD-10-CM | POA: Diagnosis present

## 2021-11-16 LAB — PSA: Prostatic Specific Antigen: 1.38 ng/mL (ref 0.00–4.00)

## 2021-11-19 ENCOUNTER — Other Ambulatory Visit: Payer: Self-pay

## 2021-11-19 ENCOUNTER — Ambulatory Visit
Admission: RE | Admit: 2021-11-19 | Discharge: 2021-11-19 | Disposition: A | Discharge status: Home / Self Care | Payer: Medicare Other | Source: Ambulatory Visit | Attending: Radiation Oncology | Admitting: Radiation Oncology

## 2021-11-19 ENCOUNTER — Encounter: Payer: Self-pay | Admitting: Radiation Oncology

## 2021-11-19 VITALS — BP 128/75 | HR 98 | Temp 97.0°F | Wt 202.0 lb

## 2021-11-19 DIAGNOSIS — C61 Malignant neoplasm of prostate: Secondary | ICD-10-CM | POA: Insufficient documentation

## 2021-11-19 DIAGNOSIS — J449 Chronic obstructive pulmonary disease, unspecified: Secondary | ICD-10-CM | POA: Insufficient documentation

## 2021-11-19 DIAGNOSIS — Z923 Personal history of irradiation: Secondary | ICD-10-CM | POA: Diagnosis not present

## 2021-11-19 NOTE — Progress Notes (Signed)
Radiation Oncology Follow up Note  Name: Leslie Sims [facility]   Date:   11/19/2021 MRN:  [id number] DOB: 1951-06-20    This 70 y.o. male presents to the clinic today for 23-month follow-up status post IMRT radiation therapy to his prostate for stage IIa Gleason 7 (3+4) adenocarcinoma presenting with a PSA of 4.5.  REFERRING PROVIDER: [name], MD  HPI: Patient is a 70 year old male now out 10 months having completed image guided IMRT radiation therapy to his prostate for Gleason 7 (3+4) adenocarcinoma presenting with a PSA of 4.5.  From a prostate standpoint he is doing well very low side effect profile.  Specifically denies any increased lower urinary tract symptoms diarrhea or fatigue.  He does deal with issues with his COPD he is on continuous nasal oxygen.  His most recent PSA is.  1.38 down from 2.38 months prior.  COMPLICATIONS OF TREATMENT: none  FOLLOW UP COMPLIANCE: keeps appointments   PHYSICAL EXAM:  BP 128/75   Pulse 98   Temp (!) 97 F (36.1 C) (Tympanic)   Wt 202 lb (91.6 kg)   SpO2 97%   BMI 28.98 kg/m  Well-developed male on nasal oxygen in NAD.  Well-developed well-nourished patient in NAD. HEENT reveals PERLA, EOMI, discs not visualized.  Oral cavity is clear. No oral mucosal lesions are identified. Neck is clear without evidence of cervical or supraclavicular adenopathy. Lungs are clear to A&P. Cardiac examination is essentially unremarkable with regular rate and rhythm without murmur rub or thrill. Abdomen is benign with no organomegaly or masses noted. Motor sensory and DTR levels are equal and symmetric in the upper and lower extremities. Cranial nerves II through XII are grossly intact. Proprioception is intact. No peripheral adenopathy or edema is identified. No motor or sensory levels are noted. Crude visual fields are within normal range.  RADIOLOGY RESULTS: No current films for review  PLAN: Present time patient is doing excellent with excellent  biochemical control of his prostate cancer.  I have asked him to recontact Dr. Patsey Berthold for tune up of his pulmonary functions he has been going to the ER frequently for some of his pulmonary complaints.  I have otherwise asked to see him back in 6 months for follow-up with a PSA at that time.  Patient knows to call with any concerns.  I would like to take this opportunity to thank you for allowing me to participate in the care of your patient.Noreene Filbert, MD     Noreene Filbert, MD

## 2021-12-21 ENCOUNTER — Emergency Department: Payer: Medicare Other

## 2021-12-21 ENCOUNTER — Other Ambulatory Visit: Payer: Self-pay

## 2021-12-21 DIAGNOSIS — R0602 Shortness of breath: Secondary | ICD-10-CM | POA: Diagnosis present

## 2021-12-21 DIAGNOSIS — I5022 Chronic systolic (congestive) heart failure: Secondary | ICD-10-CM | POA: Diagnosis not present

## 2021-12-21 DIAGNOSIS — I110 Hypertensive heart disease with heart failure: Secondary | ICD-10-CM | POA: Insufficient documentation

## 2021-12-21 DIAGNOSIS — Z8546 Personal history of malignant neoplasm of prostate: Secondary | ICD-10-CM | POA: Diagnosis not present

## 2021-12-21 DIAGNOSIS — J441 Chronic obstructive pulmonary disease with (acute) exacerbation: Secondary | ICD-10-CM | POA: Diagnosis not present

## 2021-12-21 DIAGNOSIS — R1031 Right lower quadrant pain: Secondary | ICD-10-CM | POA: Diagnosis not present

## 2021-12-21 DIAGNOSIS — Z7982 Long term (current) use of aspirin: Secondary | ICD-10-CM | POA: Diagnosis not present

## 2021-12-21 DIAGNOSIS — Z79899 Other long term (current) drug therapy: Secondary | ICD-10-CM | POA: Insufficient documentation

## 2021-12-21 DIAGNOSIS — Z7951 Long term (current) use of inhaled steroids: Secondary | ICD-10-CM | POA: Insufficient documentation

## 2021-12-21 DIAGNOSIS — I2510 Atherosclerotic heart disease of native coronary artery without angina pectoris: Secondary | ICD-10-CM | POA: Diagnosis not present

## 2021-12-21 LAB — COMPREHENSIVE METABOLIC PANEL
ALT: 33 U/L (ref 0–44)
AST: 26 U/L (ref 15–41)
Albumin: 3.9 g/dL (ref 3.5–5.0)
Alkaline Phosphatase: 82 U/L (ref 38–126)
Anion gap: 9 (ref 5–15)
BUN: 10 mg/dL (ref 8–23)
CO2: 31 mmol/L (ref 22–32)
Calcium: 9.1 mg/dL (ref 8.9–10.3)
Chloride: 99 mmol/L (ref 98–111)
Creatinine, Ser: 0.91 mg/dL (ref 0.61–1.24)
GFR, Estimated: >60 mL/min (ref >60)
Glucose, Bld: 119 mg/dL — ABNORMAL HIGH (ref 70–99)
Potassium: 3.7 mmol/L (ref 3.5–5.1)
Sodium: 139 mmol/L (ref 135–145)
Total Bilirubin: 0.6 mg/dL (ref 0.3–1.2)
Total Protein: 6.9 g/dL (ref 6.5–8.1)

## 2021-12-21 LAB — CBC WITH DIFFERENTIAL/PLATELET
Abs Immature Granulocytes: 0.06 10*3/uL (ref 0.00–0.07)
Basophils Absolute: 0 10*3/uL (ref 0.0–0.1)
Basophils Relative: 0 %
Eosinophils Absolute: 0.1 10*3/uL (ref 0.0–0.5)
Eosinophils Relative: 1 %
HCT: 41.1 % (ref 39.0–52.0)
Hemoglobin: 12.8 g/dL — ABNORMAL LOW (ref 13.0–17.0)
Immature Granulocytes: 1 %
Lymphocytes Relative: 8 %
Lymphs Abs: 0.6 10*3/uL — ABNORMAL LOW (ref 0.7–4.0)
MCH: 28.6 pg (ref 26.0–34.0)
MCHC: 31.1 g/dL (ref 30.0–36.0)
MCV: 91.9 fL (ref 80.0–100.0)
Monocytes Absolute: 0.4 10*3/uL (ref 0.1–1.0)
Monocytes Relative: 6 %
Neutro Abs: 6.2 10*3/uL (ref 1.7–7.7)
Neutrophils Relative %: 84 %
Platelets: 279 10*3/uL (ref 150–400)
RBC: 4.47 MIL/uL (ref 4.22–5.81)
RDW: 12.9 % (ref 11.5–15.5)
WBC: 7.4 10*3/uL (ref 4.0–10.5)
nRBC: 0 % (ref 0.0–0.2)

## 2021-12-21 LAB — TROPONIN I (HIGH SENSITIVITY)
Troponin I (High Sensitivity): 7 ng/L (ref <18)
Troponin I (High Sensitivity): 6 ng/L (ref <18)

## 2021-12-21 IMAGING — CR DG CHEST 2V
1 series · 2 of 2 positions shown · non-contrast
Comparison: [DATE]

CLINICAL DATA: Short of breath, COPD exacerbation, chronic oxygen
use

EXAM:
CHEST - 2 VIEW

[Series 1: dg chest 2 view · 0.14mm/px · 2 of 2 slices shown]
[im 1/2]
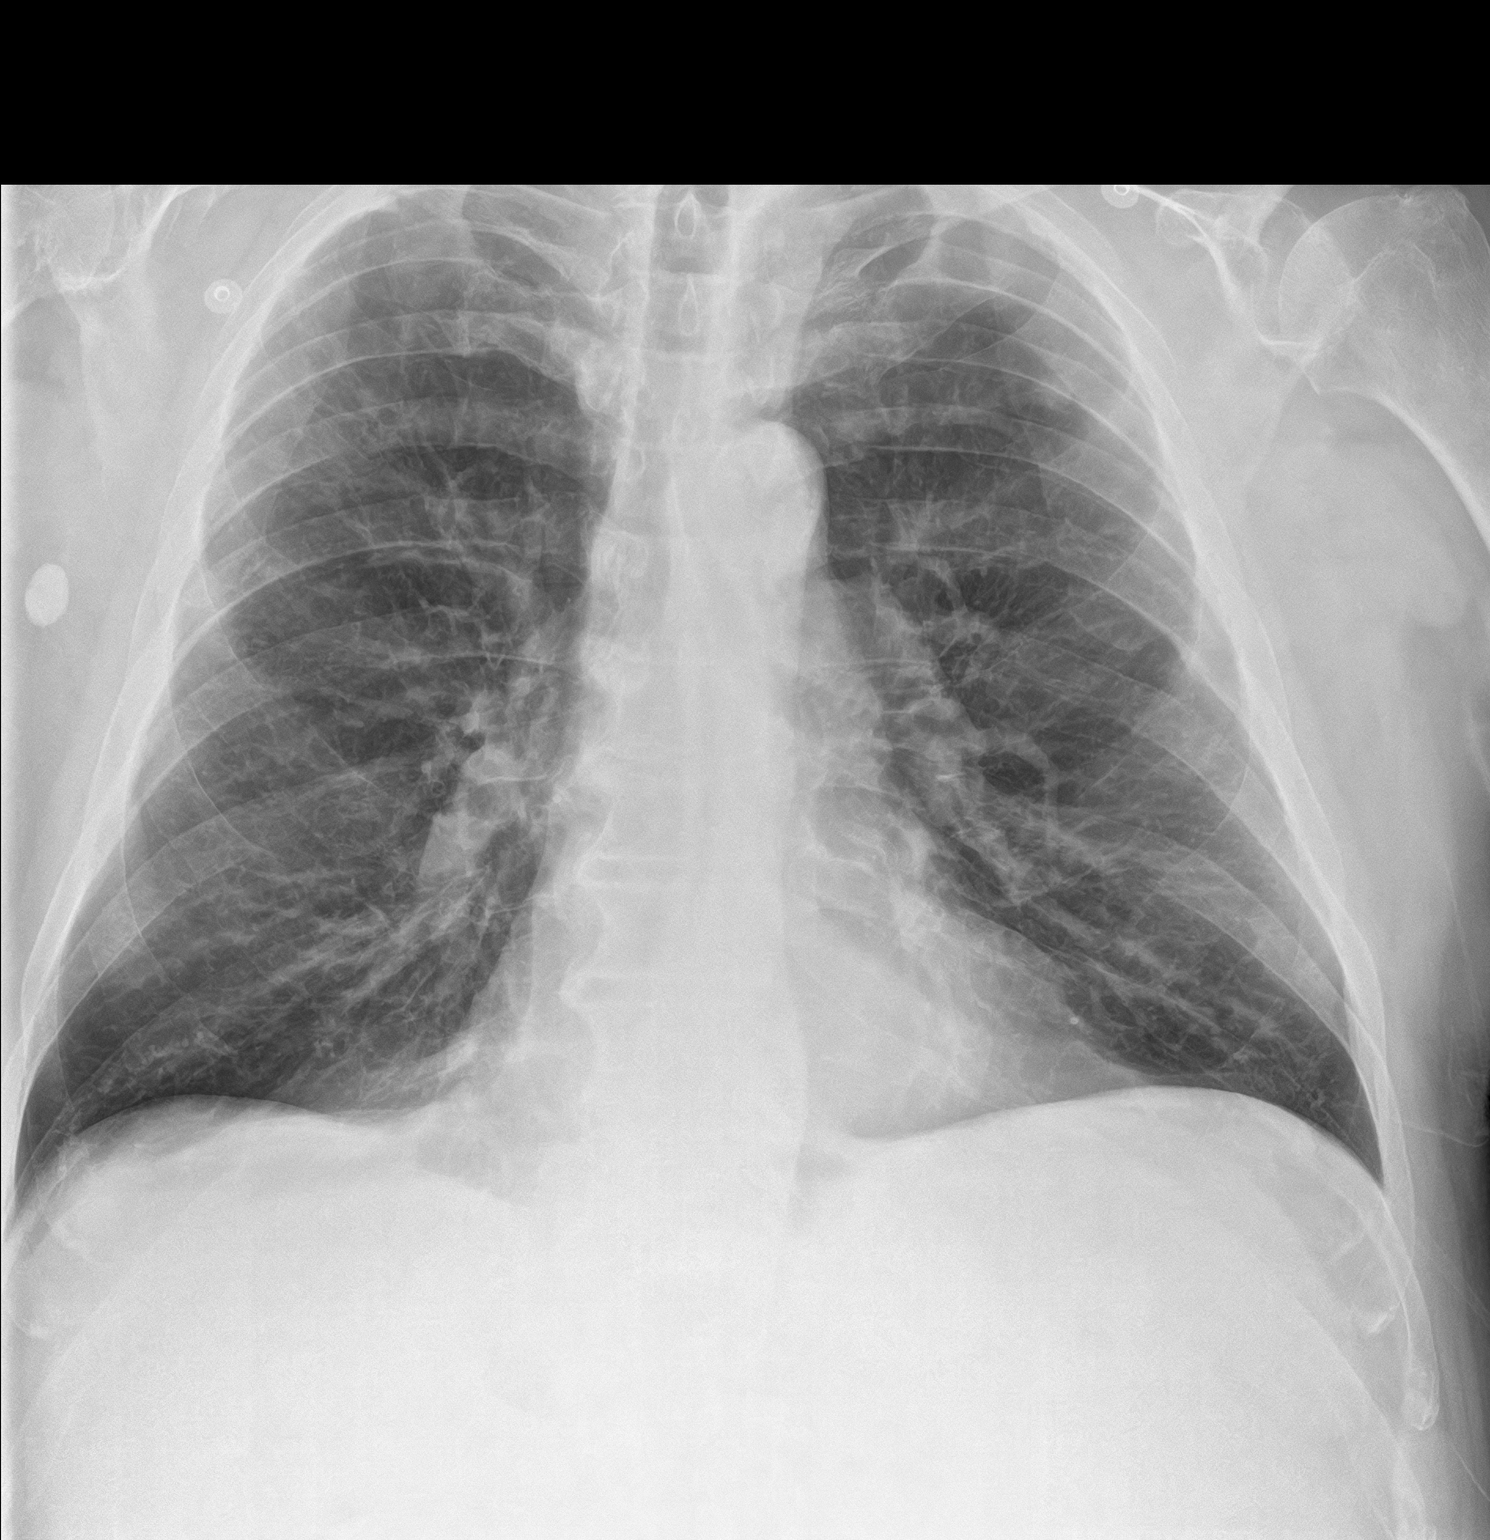
[im 2/2]
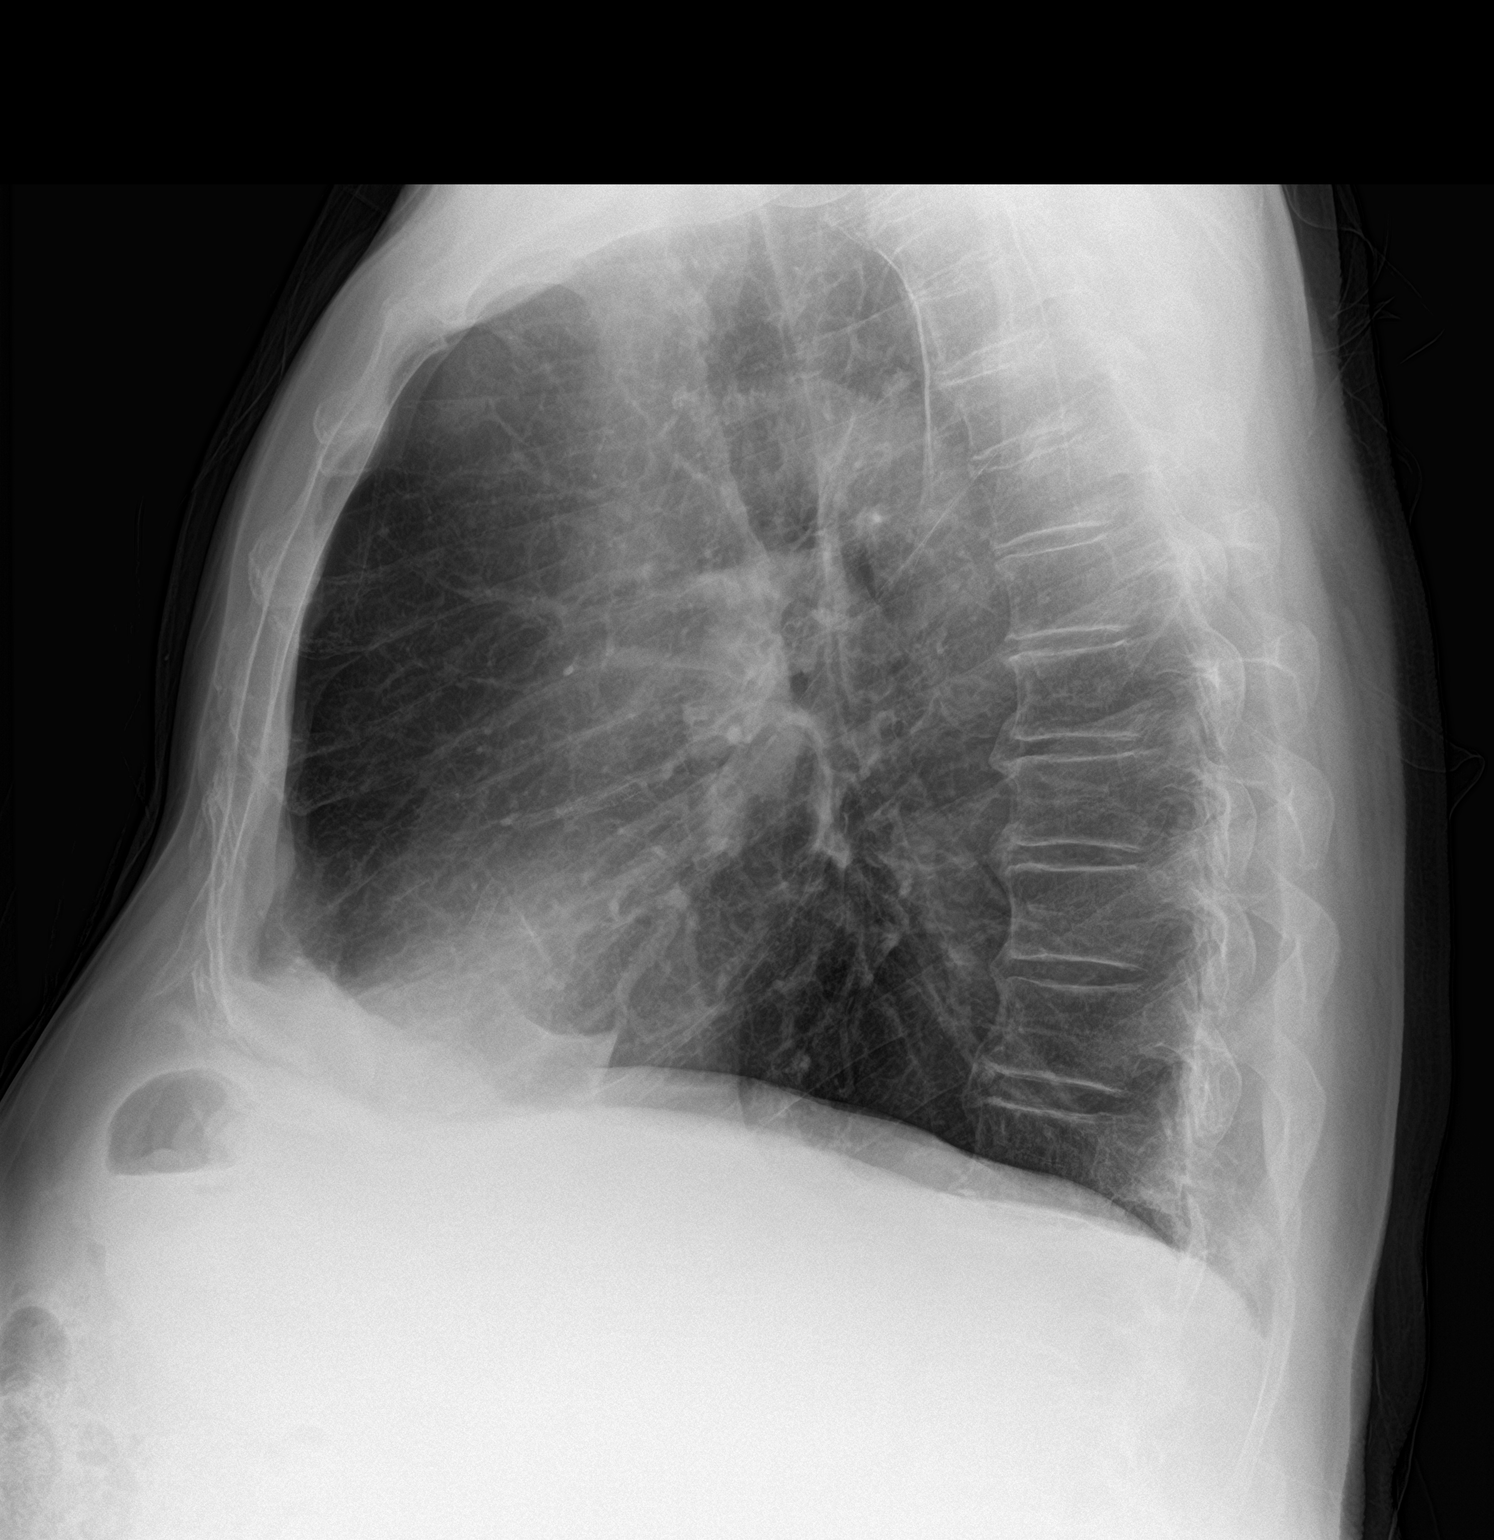

[2 of 2 positions shown; findings below may reference images not displayed]

FINDINGS: Frontal and lateral views of the chest demonstrate an unremarkable
cardiac silhouette. No acute airspace disease, effusion, or
pneumothorax. Lungs are hyperinflated with background scarring
consistent with emphysema. No acute bony abnormalities.
IMPRESSION: 1. Stable emphysema.  No acute process.

## 2021-12-21 IMAGING — CT CT RENAL STONE PROTOCOL
2 of 4 series · 16 of 46 positions shown, 18 images · non-contrast
Comparison: None.

CLINICAL DATA: Renal stones

EXAM:
CT ABDOMEN AND PELVIS WITHOUT CONTRAST
TECHNIQUE: Multidetector CT imaging of the abdomen and pelvis was performed
following the standard protocol without IV contrast.

[Series 2: stone full standard · axial · 0.92mm/px · z∈[-1071,-631]mm · 13 of 98 slices shown, 15 images]
[im 5/98  soft-tissue]
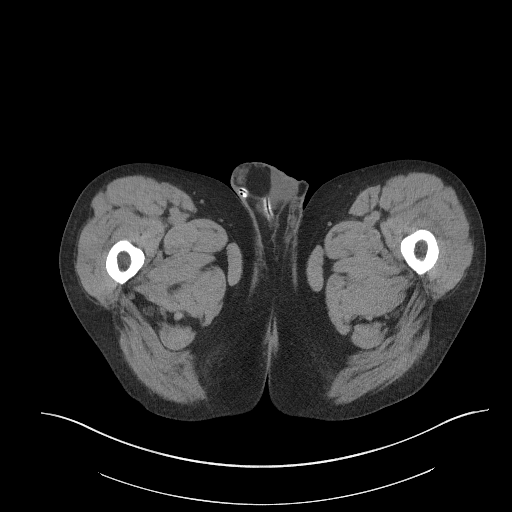
[im 5/98  bone]
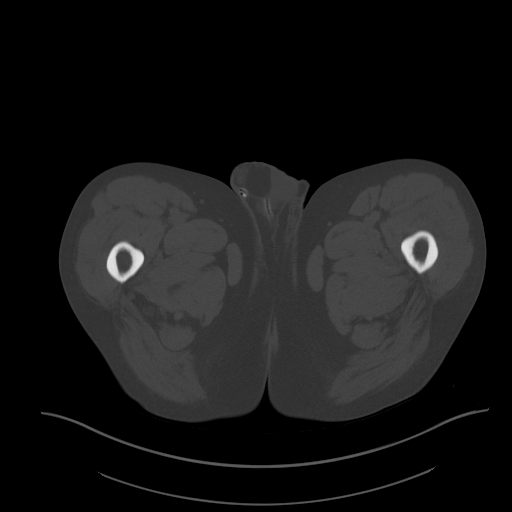
[im 13/98  soft-tissue]
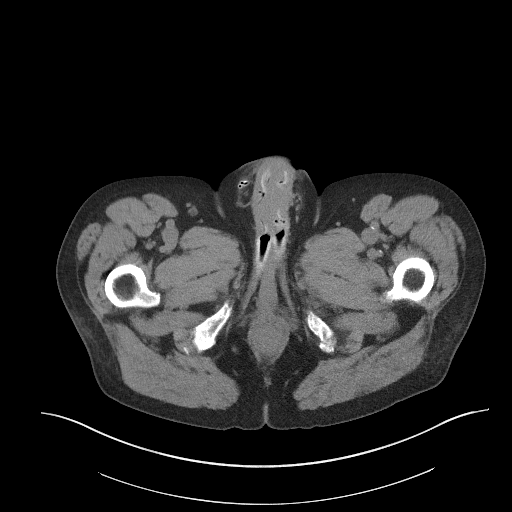
[im 21/98  soft-tissue]
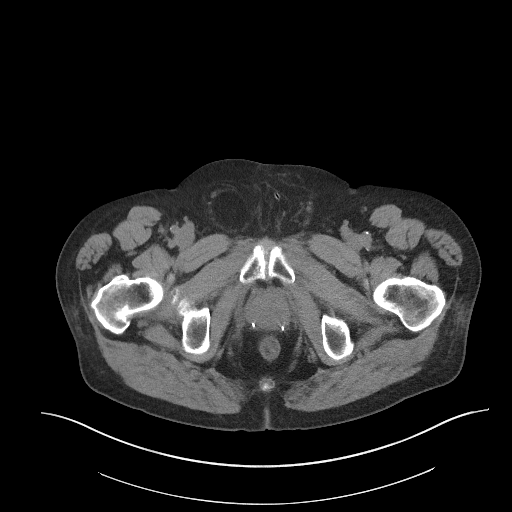
[im 29/98  soft-tissue]
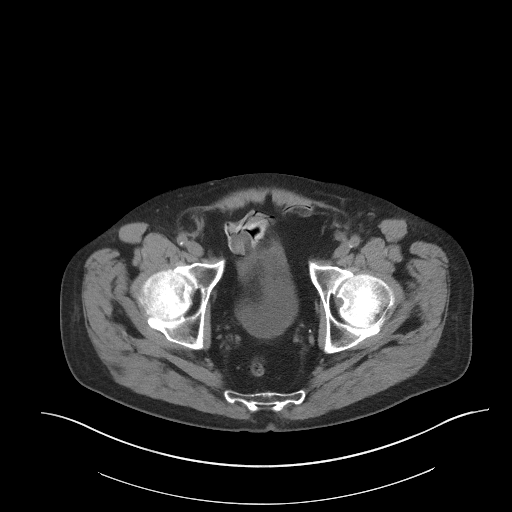
[im 33/98  soft-tissue]
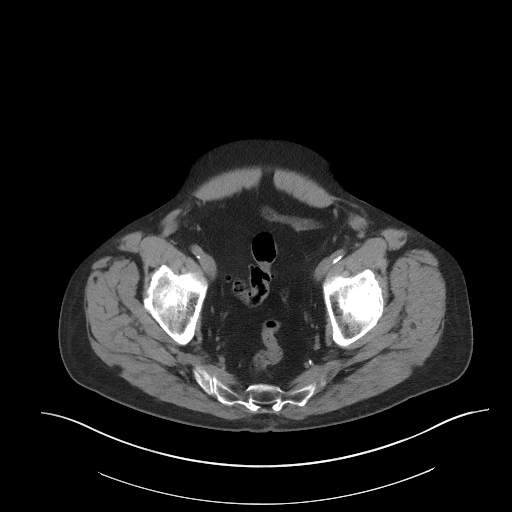
[im 41/98  soft-tissue]
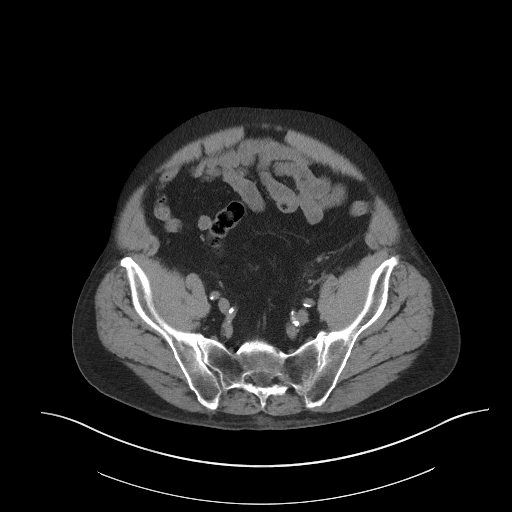
[im 49/98  soft-tissue]
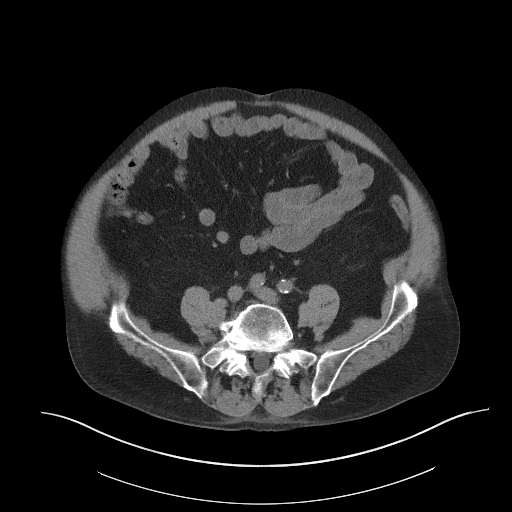
[im 57/98  soft-tissue]
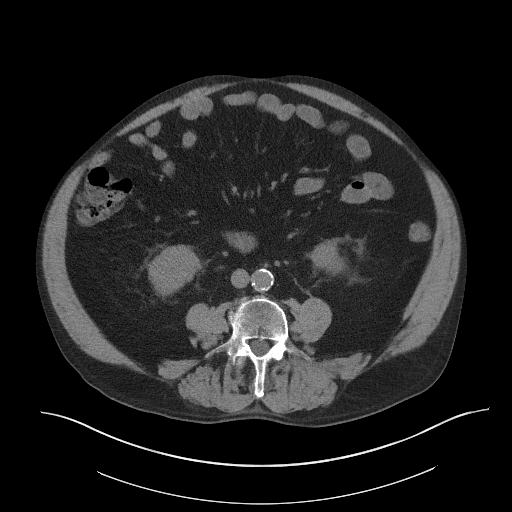
[im 65/98  soft-tissue]
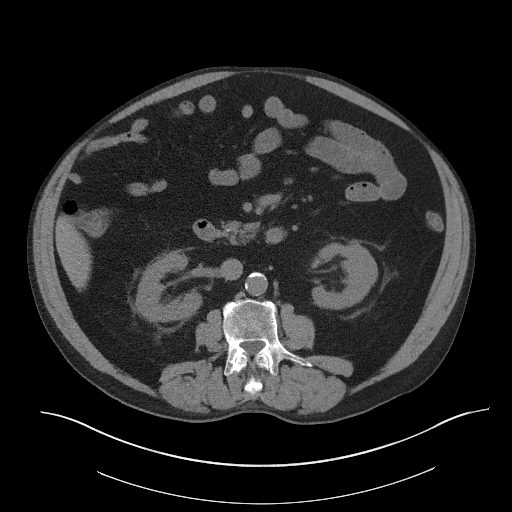
[im 65/98  bone]
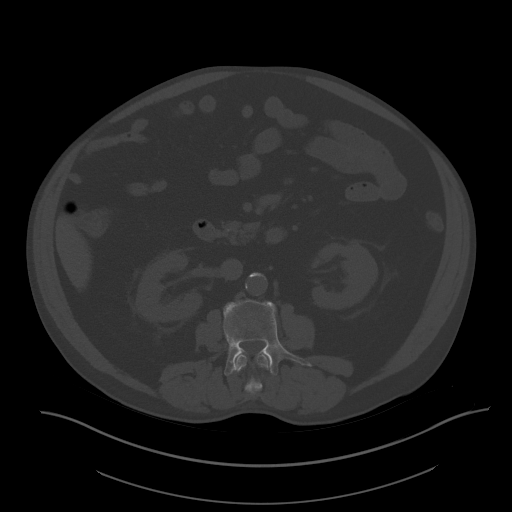
[im 69/98  soft-tissue]
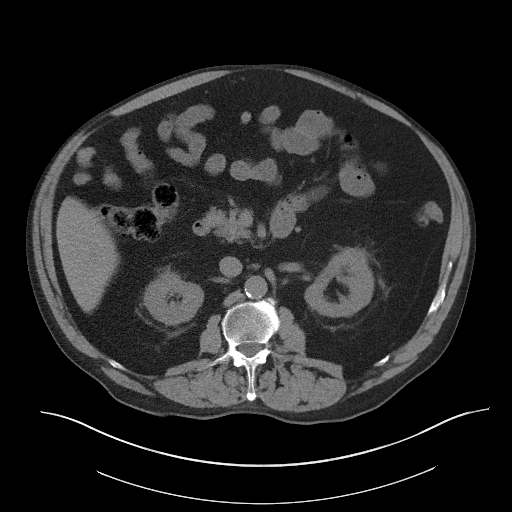
[im 77/98  soft-tissue]
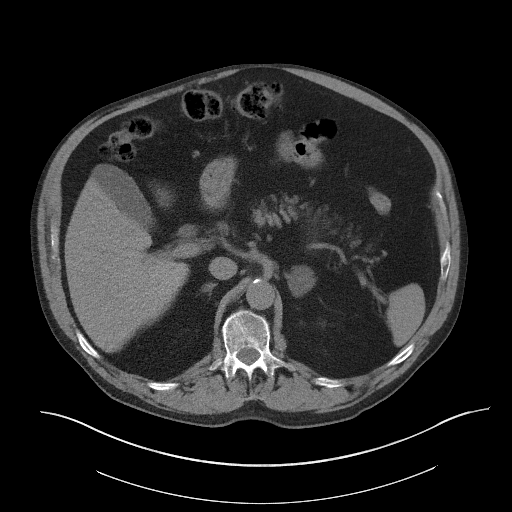
[im 85/98  soft-tissue]
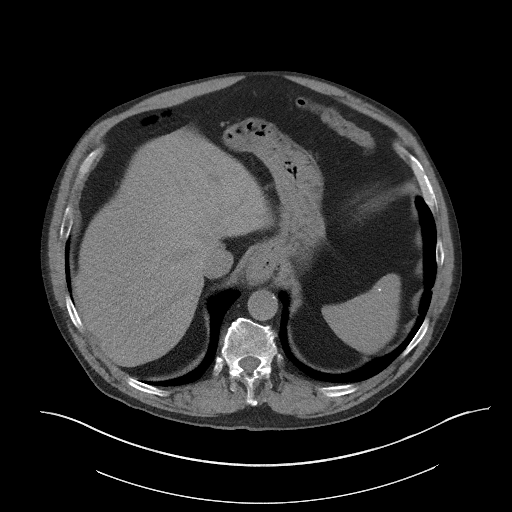
[im 93/98  soft-tissue]
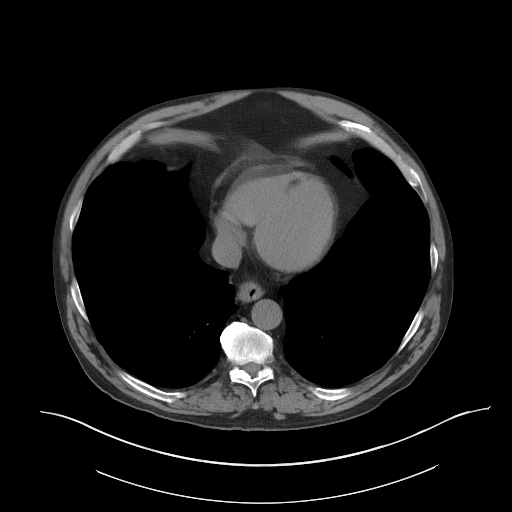

[Series 5: coronal · coronal · 0.81mm/px · 3 of 170 slices shown]
[im 57/170  soft-tissue]
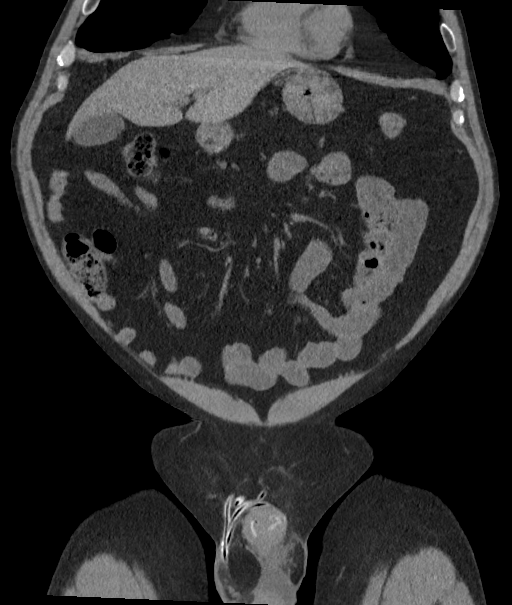
[im 76/170  soft-tissue]
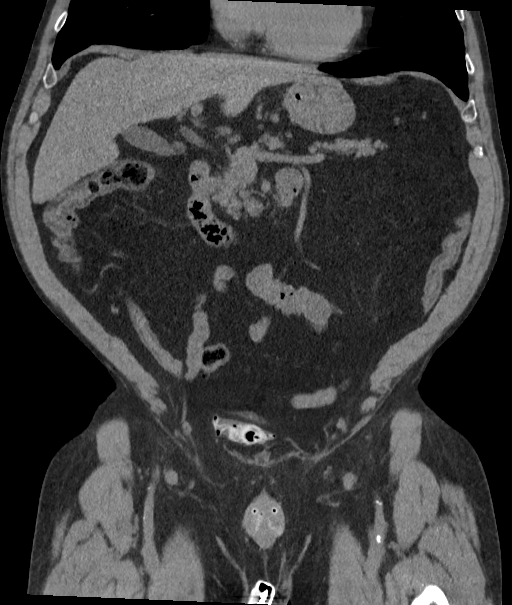
[im 94/170  soft-tissue]
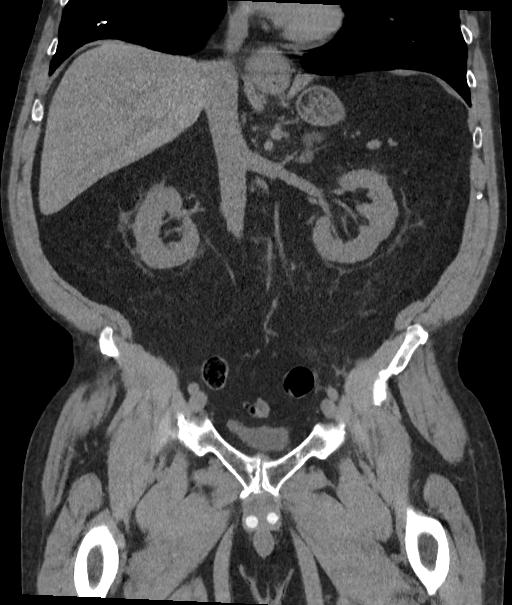

[16 of 46 positions shown; findings below may reference images not displayed]

FINDINGS: Lower chest: Visualized lower lung fields are unremarkable. There is
fat attenuation in myometrium near the apex of left ventricle,
possibly old infarct.

Hepatobiliary: Unremarkable.

Pancreas: No focal abnormality is seen.

Spleen: Spleen is not enlarged. There are few small calcified
granulomas in the spleen.

Adrenals/Urinary Tract: There is 12 mm nodule in the right adrenal.
There is 3 cm nodule in the left adrenal. Density measurements are
less than 0 Hounsfield units. There is no hydronephrosis. There are
no renal or ureteral stones. Urinary bladder is not distended.

Stomach/Bowel: Small hiatal hernia is seen. Stomach is not
distended. Small bowel loops are not dilated. Appendix is not
dilated. There is no significant wall thickening in colon. Scattered
diverticula are seen in the colon without signs of focal
diverticulitis.

Vascular/Lymphatic: Scattered arterial calcifications are seen. No
significant lymphadenopathy seen.

Reproductive: There is an implanted device in the penis, possibly
used for management of erectile dysfunction.

Other: There is no ascites or pneumoperitoneum. Bilateral inguinal
hernias containing fat are seen, larger on the right side.

Musculoskeletal: Degenerative changes are noted in the lumbar spine
with encroachment of neural foramina at multiple levels.
IMPRESSION: There is no evidence of intestinal obstruction or pneumoperitoneum.
There is no hydronephrosis. Appendix is not dilated.

There are low-density nodules in both adrenals, possibly incidental
adenomas. Comparison with previous studies or follow-up CT in 6
months may be considered to assess stability.

Small hiatal hernia. Diverticulosis of colon without signs of focal
diverticulitis. Bilateral inguinal hernias containing fat, larger on
the right side. Lumbar spondylosis.

Other findings as described in the body of the report.

## 2021-12-21 NOTE — ED Triage Notes (Signed)
Pt comes into the ED via EMS from home with c/o SOB, pt c/o having a flare of COPD since cooking a couple of days  #20gLFA 125mg  Solu medrol Duoneb x1 On 2.5L [place] all the time [id number] 128/78 HR 88

## 2021-12-21 NOTE — ED Provider Notes (Signed)
°  Emergency Medicine Provider Triage Evaluation Note  Leslie Sims , a 71 y.o.male,  was evaluated in triage.  Pt complains of shortness of breath.  Patient states that he was cooking at home few days ago when he was exposed to heavy smoke.  Since then, he has had progressive shortness of breath.  Additionally, he endorses right-sided flank pain with difficulty urinating.  Denies any other symptoms at this time.   Review of Systems  Positive: Flank pain, shortness of breath Negative: Denies fever, chest pain, vomiting  Physical Exam   Vitals:   12/21/21 1537  BP: 136/77  Pulse: 85  Resp: 18  Temp: 97.8 F (36.6 C)  SpO2: 97%   Gen:   Awake, no distress   Resp:  Normal effort  MSK:   Moves extremities without difficulty  Other:  CVA tenderness on the right side  Medical Decision Making  Given the patient's initial medical screening exam, the following diagnostic evaluation has been ordered. The patient will be placed in the appropriate treatment space, once one is available, to complete the evaluation and treatment. I have discussed the plan of care with the patient and I have advised the patient that an ED physician or mid-level practitioner will reevaluate their condition after the test results have been received, as the results may give them additional insight into the type of treatment they may need.    Diagnostics: Labs, EKG, CT renal stone.  Treatments: none immediately   Teodoro Spray, [place] 12/21/21 1542    Duffy Bruce, MD 12/21/21 1719

## 2021-12-22 ENCOUNTER — Emergency Department
Admission: EM | Admit: 2021-12-22 | Discharge: 2021-12-22 | Disposition: A | Discharge status: Home / Self Care | Payer: Medicare Other | Attending: Emergency Medicine | Admitting: Emergency Medicine

## 2021-12-22 DIAGNOSIS — J441 Chronic obstructive pulmonary disease with (acute) exacerbation: Secondary | ICD-10-CM | POA: Diagnosis not present

## 2021-12-22 LAB — URINALYSIS, ROUTINE W REFLEX MICROSCOPIC
Bacteria, UA: NONE SEEN
Bilirubin Urine: NEGATIVE
Glucose, UA: NEGATIVE mg/dL
Hgb urine dipstick: NEGATIVE
Ketones, ur: NEGATIVE mg/dL
Leukocytes,Ua: NEGATIVE
Nitrite: NEGATIVE
Protein, ur: 30 mg/dL — AB
Specific Gravity, Urine: 1.021 (ref 1.005–1.030)
Squamous Epithelial / HPF: NONE SEEN (ref 0–5)
pH: 7 (ref 5.0–8.0)

## 2021-12-22 MED ORDER — AZITHROMYCIN 250 MG PO TABS: 250.0000 mg | ORAL_TABLET | Freq: Every day | ORAL | 0 refills | Status: DC

## 2021-12-22 MED ORDER — AZITHROMYCIN 500 MG PO TABS
500.0000 mg | ORAL_TABLET | Freq: Once | ORAL | Status: AC
  Administered 2021-12-22: 500 mg via ORAL
  Filled 2021-12-22: qty 1

## 2021-12-22 MED ORDER — IPRATROPIUM-ALBUTEROL 0.5-2.5 (3) MG/3ML IN SOLN
3.0000 mL | Freq: Once | RESPIRATORY_TRACT | Status: AC
  Administered 2021-12-22: 3 mL via RESPIRATORY_TRACT
  Filled 2021-12-22: qty 3

## 2021-12-22 MED ORDER — PREDNISONE 20 MG PO TABS: ORAL_TABLET | ORAL | 0 refills | Status: DC

## 2021-12-22 NOTE — ED Provider Notes (Signed)
[facility] Provider Note    Event Date/Time   First MD Initiated Contact with Patient 12/22/21 0017     (approximate)   History   Shortness of Breath   HPI  Leslie Sims is a 71 y.o. male brought to the ED via EMS from home with a chief complaint of shortness of breath.  Patient with a history of COPD on 2.5 L oxygen continuously.  Reports COPD flareup since opening a smoky oven 2 days ago.  Received 125 mg IV Solu-Medrol and 1 DuoNeb prior to arrival by EMS.  Also endorses nontraumatic right flank pain times several days.  Denies injury/fall/trauma.  Flank pain radiates to right lower abdomen.  Denies dysuria or hematuria. Denies fever, cough, chest pain, abdominal pain, nausea, vomiting or diarrhea.     Past Medical History   Past Medical History:  Diagnosis Date   Allergic rhinitis    Anxiety    Back pain    Cancer ([place])    prostate   Colon polyps 08/2012   colonoscopy; multiple colon polyps; repeat colonoscopy in one year.   COPD (chronic obstructive pulmonary disease) ([place])    a. on home O2.   Coronary artery disease 11/2009   a. late presenting ant MI; b. LHC 100% pLAD s/p PCI/DES, 99% mRCA s/p PCI/DES, EF 35%; c. nuclear stress test 05/13: prior ant/inf infarcts w/o ischemia, EF 43%; d. LHC 02/14: widely patent stents with no other obs dz, EF 40%; e. LHC 11/17: LM nl, mLAD 10%, patent LAD stent, dLAD 20%, p-mRCA 10%, mRCA 40%, patent RCA stent, EF 35-45%    Depression    GERD (gastroesophageal reflux disease)    PYPPJKDT(267.1)    Helicobacter pylori (H. pylori)    Hemorrhoids    Hernia    inguinal   HFrEF (heart failure with reduced ejection fraction) ([place])    a. 10/2016 LV gram: EF 35-45%; b. 06/2018 Echo: EF 40-45%. c. 01/2021 Echo: EF 40-45%   Hyperlipidemia    Hypertension    Insomnia    Ischemic cardiomyopathy    a. 10/2016 LV gram: EF 35-45%; b. 06/2018 Echo: EF 40-45%.   Myalgia    Peptic ulcer    Pulmonary nodules    ST  elevation (STEMI) myocardial infarction involving left anterior descending coronary artery ([place]) 11/2009   a. s/p PCI to the LAD   Status post dilation of esophageal narrowing 2000   Tinea pedis    Wears dentures    partial upper     Active Problem List   Patient Active Problem List   Diagnosis Date Noted   COPD exacerbation ([place]) 07/31/2021   Prostate cancer ([place]) 10/11/2020   Elevated PSA 07/28/2020   Iron deficiency anemia 05/20/2020   Goals of care, counseling/discussion    Palliative care by specialist    Healthcare-associated pneumonia 01/28/2019   COPD with acute exacerbation ([place]) 12/31/2018   Generalized anxiety disorder 11/12/2018   Moderate episode of recurrent major depressive disorder ([place]) 11/12/2018   Acute respiratory failure with hypoxia ([place]) 09/29/2018   Acute respiratory failure with hypoxia and hypercapnia ([place]) 09/13/2018   Acute on chronic respiratory failure with hypoxia and hypercapnia ([place]) 08/24/2018   Chest pain 07/02/2018   Grief reaction 01/13/2018   COPD with hypoxia ([place]) 12/25/2017   Unstable angina (H[facility])    Benign neoplasm of ascending colon    Benign neoplasm of descending colon    Benign neoplasm of sigmoid colon    History of  colonic polyps    Pulmonary nodules/lesions, multiple 01/05/2015   Bruit of left carotid artery 11/04/2014   Sleep disorder 07/18/2013   Seasonal and perennial allergic rhinitis 05/29/2013   Bradycardia 04/20/2011   SMOKER 07/30/2010   CAD, NATIVE VESSEL 04/23/2010   Hyperlipidemia 03/24/2010   DEPRESSION/ANXIETY 09/38/1829   Chronic systolic heart failure ([place]) 03/24/2010   COPD, very severe ([place]) 03/24/2010   GERD 03/24/2010   Essential hypertension 11/21/2009     Past Surgical History   Past Surgical History:  Procedure Laterality Date   Admission  12/20/2012   COPD exacerbation.  [place].   CARDIAC CATHETERIZATION     CARDIAC CATHETERIZATION  02/05/13   [facility]   CARDIAC CATHETERIZATION  10/14   [facility] :  patent stents with no change in anatomy. EF: 40$   CARDIAC CATHETERIZATION Left 11/12/2016   Procedure: Left Heart Cath and Coronary Angiography;  Surgeon: Wellington Hampshire, MD;  Location: [facility];  Service: Cardiovascular;  Laterality: Left;   COLONOSCOPY     COLONOSCOPY WITH PROPOFOL N/A 05/18/2016   Procedure: COLONOSCOPY WITH PROPOFOL;  Surgeon: Lucilla Lame, MD;  Location: [facility] ENDOSCOPY;  Service: Endoscopy;  Laterality: N/A;   COLONOSCOPY WITH PROPOFOL N/A 03/19/2020   Procedure: COLONOSCOPY WITH PROPOFOL;  Surgeon: Lin Landsman, MD;  Location: [facility] ENDOSCOPY;  Service: Gastroenterology;  Laterality: N/A;   CORONARY ANGIOPLASTY WITH STENT PLACEMENT  09/2009   LAD 3.0 X23 mm Xience DES, RCA: 4.0 X 15 mm Xience DES   ELECTROMAGNETIC NAVIGATION BROCHOSCOPY N/A 10/27/2017   Procedure: ELECTROMAGNETIC NAVIGATION BRONCHOSCOPY;  Surgeon: Flora Lipps, MD;  Location: [facility] ORS;  Service: Cardiopulmonary;  Laterality: N/A;   ESOPHAGEAL DILATION     ESOPHAGOGASTRODUODENOSCOPY  2008   ESOPHAGOGASTRODUODENOSCOPY  08/20/2012   HERNIA REPAIR  07/20/2012   L inguinal hernia repair   PENILE PROSTHESIS IMPLANT       Home Medications   Prior to Admission medications   Medication Sig Start Date End Date Taking? Authorizing Provider  azithromycin (ZITHROMAX) 250 MG tablet Take 1 tablet (250 mg total) by mouth daily. 12/22/21  Yes Paulette Blanch, MD  predniSONE (DELTASONE) 20 MG tablet 3 tablets daily x 4 days 12/22/21  Yes Paulette Blanch, MD  albuterol (VENTOLIN HFA) 108 (90 Base) MCG/ACT inhaler Inhale 2 puffs into the lungs every 6 (six) hours as needed for wheezing or shortness of breath. 05/04/21   Margarette Canada, NP  aspirin 81 MG tablet Take 81 mg by mouth daily.    [provider]  atorvastatin (LIPITOR) 80 MG tablet Take 1 tablet (80 mg total) by mouth daily. 03/18/21 06/16/21  Rise Mu, PA-C  escitalopram (LEXAPRO) 20 MG tablet Take 2 tablets by mouth every morning. 04/28/21    [provider]  esomeprazole (NEXIUM) 40 MG capsule TAKE ONE CAPSULE BY MOUTH ONCE DAILY 09/04/21   [name], MD  fluticasone [facility]) 50 MCG/ACT nasal spray Place 2 sprays into both nostrils daily. 06/08/21   [provider]  furosemide (LASIX) 40 MG tablet Take 1 tablet (40 mg total) by mouth daily. 02/06/21 02/01/22  Theora Gianotti, NP  ipratropium-albuterol (DUONEB) 0.5-2.5 (3) MG/3ML SOLN USE 1 VIAL VIA NEBULIZER AND INHALE BY MOUTH 4 TIMES A DAY 02/20/21   [name], MD  loratadine (CLARITIN) 10 MG tablet Take 10 mg by mouth daily. 06/08/21   [provider]  LORazepam (ATIVAN) 1 MG tablet Take 1 tablet (1 mg total) by mouth every 4 (four)  hours while awake. 02/14/19   [name], MD  losartan (COZAAR) 25 MG tablet Take 25 mg by mouth daily. 02/23/19   [provider]  mirtazapine (REMERON) 15 MG tablet Take 15 mg by mouth at bedtime. 06/02/20   [provider]  Multiple Vitamin (MULTIVITAMIN) capsule Take 1 capsule by mouth daily.    [provider]  nitroGLYCERIN (NITROSTAT) 0.4 MG SL tablet Place 1 tablet (0.4 mg total) under the tongue every 5 (five) minutes as needed. 06/04/20   Loel Dubonnet, NP  OLANZapine (ZYPREXA) 2.5 MG tablet Take 2.5 mg by mouth at bedtime.    [provider]  OXYGEN Inhale 2.5 L into the lungs daily.    [provider]  Spacer/Aero-Holding Chambers (AEROCHAMBER MV) inhaler Use as instructed 05/04/21   Margarette Canada, NP  sucralfate (CARAFATE) 1 g tablet Take 1 g by mouth 4 (four) times daily. 06/03/20   [provider]     Allergies  Prozac [fluoxetine hcl], Effexor xr [venlafaxine hcl er], and Wellbutrin [bupropion]   Family History   Family History  Problem Relation Age of Onset   Heart attack Brother        Brother #1   Diabetes Brother    Hypertension Brother        #3   Coronary artery disease Father 29       deceased   Heart attack Father     Diabetes Father    Heart disease Father    COPD Mother 39       deceased   Alcohol abuse Sister        polysubstance abuse   COPD Sister    Lung cancer Sister    Alcohol abuse Sister        polysubstance abuse   Penile cancer Brother    Diabetes Brother    Prostate cancer Neg Hx    Bladder Cancer Neg Hx    Kidney cancer Neg Hx      Physical Exam  Triage Vital Signs: ED Triage Vitals [12/21/21 1537]  Enc Vitals Group     BP 136/77     Pulse Rate 85     Resp 18     Temp 97.8 F (36.6 C)     Temp Source Oral     SpO2 97 %     Weight      Height      Head Circumference      Peak Flow      Pain Score      Pain Loc      Pain Edu?      Excl. in [place]?     Updated Vital Signs: BP 132/74 (BP Location: Right Arm)    Pulse 78    Temp 98.4 F (36.9 C) (Oral)    Resp 20    SpO2 97%    General: Awake, no distress.  CV:  Good peripheral perfusion.  Resp:  Increased effort.  Lungs diminished bibasilarly, otherwise CTA B Abd:  No distention.  Other:     ED Results / Procedures / Treatments  Labs (all labs ordered are listed, but only abnormal results are displayed) Labs Reviewed  CBC WITH DIFFERENTIAL/PLATELET - Abnormal; Notable for the following components:      Result Value   Hemoglobin 12.8 (*)    Lymphs Abs 0.6 (*)    All other components within normal limits  COMPREHENSIVE METABOLIC PANEL - Abnormal; Notable for the following components:   Glucose,  Bld 119 (*)    All other components within normal limits  URINALYSIS, ROUTINE W REFLEX MICROSCOPIC - Abnormal; Notable for the following components:   Color, Urine YELLOW (*)    APPearance CLEAR (*)    Protein, ur 30 (*)    All other components within normal limits  TROPONIN I (HIGH SENSITIVITY)  TROPONIN I (HIGH SENSITIVITY)     EKG  ED ECG REPORT I, Anessa Charley J, the attending physician, personally viewed and interpreted this ECG.   Date: 12/22/2021  EKG Time: 1542  Rate: 82  Rhythm: normal sinus  rhythm  Axis: LAD  Intervals:right bundle branch block and left anterior fascicular block  ST&T Change: Nonspecific    RADIOLOGY  ED interpretation: I have personally reviewed patient's chest x-ray and note radiologist report - COPD, no acute cardiopulmonary process; CT renal stone study demonstrates unremarkable except low density adrenal nodules requiring outpatient follow-up  Official radiology report(s): DG Chest 2 View  Result Date: 12/21/2021 CLINICAL DATA:  Short of breath, COPD exacerbation, chronic oxygen use EXAM: CHEST - 2 VIEW COMPARISON:  10/19/2021 FINDINGS: Frontal and lateral views of the chest demonstrate an unremarkable cardiac silhouette. No acute airspace disease, effusion, or pneumothorax. Lungs are hyperinflated with background scarring consistent with emphysema. No acute bony abnormalities. IMPRESSION: 1. Stable emphysema.  No acute process. Electronically Signed   By: Randa Ngo M.D.   On: 12/21/2021 15:10   CT Renal Stone Study  Result Date: 12/21/2021 CLINICAL DATA:  Renal stones EXAM: CT ABDOMEN AND PELVIS WITHOUT CONTRAST TECHNIQUE: Multidetector CT imaging of the abdomen and pelvis was performed following the standard protocol without IV contrast. COMPARISON:  None. FINDINGS: Lower chest: Visualized lower lung fields are unremarkable. There is fat attenuation in myometrium near the apex of left ventricle, possibly old infarct. Hepatobiliary: Unremarkable. Pancreas: No focal abnormality is seen. Spleen: Spleen is not enlarged. There are few small calcified granulomas in the spleen. Adrenals/Urinary Tract: There is 12 mm nodule in the right adrenal. There is 3 cm nodule in the left adrenal. Density measurements are less than 0 Hounsfield units. There is no hydronephrosis. There are no renal or ureteral stones. Urinary bladder is not distended. Stomach/Bowel: Small hiatal hernia is seen. Stomach is not distended. Small bowel loops are not dilated. Appendix is not  dilated. There is no significant wall thickening in colon. Scattered diverticula are seen in the colon without signs of focal diverticulitis. Vascular/Lymphatic: Scattered arterial calcifications are seen. No significant lymphadenopathy seen. Reproductive: There is an implanted device in the penis, possibly used for management of erectile dysfunction. Other: There is no ascites or pneumoperitoneum. Bilateral inguinal hernias containing fat are seen, larger on the right side. Musculoskeletal: Degenerative changes are noted in the lumbar spine with encroachment of neural foramina at multiple levels. IMPRESSION: There is no evidence of intestinal obstruction or pneumoperitoneum. There is no hydronephrosis. Appendix is not dilated. There are low-density nodules in both adrenals, possibly incidental adenomas. Comparison with previous studies or follow-up CT in 6 months may be considered to assess stability. Small hiatal hernia. Diverticulosis of colon without signs of focal diverticulitis. Bilateral inguinal hernias containing fat, larger on the right side. Lumbar spondylosis. Other findings as described in the body of the report. Electronically Signed   By: Elmer Picker M.D.   On: 12/21/2021 16:13     PROCEDURES:  Critical Care performed: No  .1-3 Lead EKG Interpretation Performed by: Paulette Blanch, MD Authorized by: Paulette Blanch, MD  Interpretation: normal     ECG rate:  80   ECG rate assessment: normal     Rhythm: sinus rhythm     Ectopy: none     Conduction: normal   Comments:     Placed on cardiac monitor to evaluate for arrhythmias   MEDICATIONS ORDERED IN ED: Medications  ipratropium-albuterol (DUONEB) 0.5-2.5 (3) MG/3ML nebulizer solution 3 mL (3 mLs Nebulization Given 12/22/21 0044)  azithromycin (ZITHROMAX) tablet 500 mg (500 mg Oral Given 12/22/21 0321)     IMPRESSION / MDM / ASSESSMENT AND PLAN / ED COURSE  I reviewed the triage vital signs and the nursing notes.                               Differential diagnosis includes, but is not limited to, viral syndrome, bronchitis including COPD exacerbation, pneumonia, reactive airway disease including asthma, CHF including exacerbation with or without pulmonary/interstitial edema, pneumothorax, ACS, thoracic trauma, and pulmonary embolism.    The patient is on the cardiac monitor to evaluate for evidence of arrhythmia and/or significant heart rate changes.  Laboratory results unremarkable including 2 sets of troponins.  Chest x-ray negative for acute cardiopulmonary process.  Will obtain UA, administer DuoNeb.  Patient already telling me he does not wish to be hospitalized.   Clinical Course as of 12/22/21 0625  Tue Dec 22, 2021  0232 Improved aeration and feeling better after DuoNeb.  Attempting to provide urine specimen. [JS]  [id number] Updated patient on unremarkable urine.  He is asking for a Z-Pak which he states is helpful for his breathing.  Will discharge home with prescriptions for azithromycin and prednisone.  Patient has albuterol nebulizer solution at home.  He will follow-up closely with his PCP.  We did discuss again whether or not patient desires to be hospitalized.  He is on continuous oxygen, feels better and prefers to go home.  Strict return precautions given.  Patient verbalizes understanding agrees with plan of care. [JS]    Clinical Course User Index [JS] Paulette Blanch, MD     FINAL CLINICAL IMPRESSION(S) / ED DIAGNOSES   Final diagnoses:  COPD exacerbation ([place])     Rx / DC Orders   ED Discharge Orders          Ordered    predniSONE (DELTASONE) 20 MG tablet        12/22/21 0259    azithromycin (ZITHROMAX) 250 MG tablet  Daily        12/22/21 0259             Note:  This document was prepared using Dragon voice recognition software and may include unintentional dictation errors.   Paulette Blanch, MD 12/22/21 [phone]

## 2021-12-22 NOTE — Discharge Instructions (Signed)
1.  Finish steroid as prescribed (Prednisone 60mg  daily x4 days). 2.  Finish antibiotic as prescribed (Azithromycin 250mg  daily x4 days). 3.  Follow-up with your doctor regarding further outpatient evaluation of possible benign tumors on your adrenal glands. 4.  Return to the ER for worsening symptoms, persistent vomiting, difficulty breathing or other concerns.

## 2021-12-24 ENCOUNTER — Ambulatory Visit (INDEPENDENT_AMBULATORY_CARE_PROVIDER_SITE_OTHER): Payer: Medicare Other | Admitting: Pulmonary Disease

## 2021-12-24 ENCOUNTER — Encounter: Payer: Self-pay | Admitting: Pulmonary Disease

## 2021-12-24 ENCOUNTER — Other Ambulatory Visit: Payer: Self-pay

## 2021-12-24 VITALS — BP 130/74 | HR 103 | Temp 98.2°F | Ht 66.0 in | Wt 200.4 lb

## 2021-12-24 DIAGNOSIS — J441 Chronic obstructive pulmonary disease with (acute) exacerbation: Secondary | ICD-10-CM | POA: Diagnosis not present

## 2021-12-24 DIAGNOSIS — R918 Other nonspecific abnormal finding of lung field: Secondary | ICD-10-CM

## 2021-12-24 DIAGNOSIS — J449 Chronic obstructive pulmonary disease, unspecified: Secondary | ICD-10-CM | POA: Diagnosis not present

## 2021-12-24 DIAGNOSIS — J9611 Chronic respiratory failure with hypoxia: Secondary | ICD-10-CM

## 2021-12-24 MED ORDER — METHYLPREDNISOLONE 4 MG PO TBPK: ORAL_TABLET | ORAL | 0 refills | Status: DC

## 2021-12-24 NOTE — Progress Notes (Signed)
Subjective:    Patient ID: Leslie Sims, male    DOB: [birth year], 71 y.o.   MRN: [id number] Chief Complaint  Patient presents with   Follow-up  Patient ID: Leslie Sims [facility], male    DOB: 12/10/1951, 71 y.o.   MRN: [id number] Requesting MD/Service: Nilda Simmer, MD Date of initial consultation: 08/10/16 by Dr. Sung Amabile Reason for consultation: COPD, smoker   PT PROFILE: 71 M, former smoker quit 02/10/2019 with severe emphysema seen by multiple pulmonologists in past ([place], [place], [place], [place]) referred for further eval and mgmt of COPD.  Previously seen by Dr. Sung Amabile.  DATA: 07/24/12 Office Spirometry: Severe obstruction (FEV1 31% pred) 08/31/13 Office Spirometry: very severe obstruction (FEV1 25% pred) 06/18/15 CT chest: Severe emphysema 03/03/17 Overnight oximetry: Lowest SpO2 72%. 95% of time SpO2 was below 90% 03/03/17 : 384 meters. Desat to 84% 10/04/17 CT chest: Two new irregularly marginated nodules, one in the RUL, and a second in the mid LUL worrisome for either synchronous lung ca or metastatic involvement of the lungs. The two small nodules described on the prior dictated report have resolved in the left superior segment and most likely were postinflammatory. Diffuse centrilobular emphysema. 10/13/17 PET scan: The new left upper lobe nodule is FDG avid consistent with malignancy. Recommend tissue confirmation. The new nodule in the right upper lobe demonstrates low level uptake. While the level of uptake suggests the possibility of an inflammatory or infectious process, a low-grade malignancy is not excluded. Given the suspected malignancy on the left, tissue confirmation should be considered on the right despite the low level of uptake. No distant metastatic disease identified 10/27/17 ENB ([place]): Transbronchial Fine Needle Aspirations 21G X7, Transbronchial Forceps Biopsy X6. ALVEOLATED LUNG TISSUE WITH HEMOSIDERIN LADEN MACROPHAGES.  NEGATIVE FOR ATYPIA AND  MALIGNANCY 11/01/17 PFTs: FVC: 2.96 > 3.14 L (68 > 72 %pred), FEV1: 0.94 > 0.93 L (27 %pred), FEV1/FVC: 32%, TLC: 9.04 L (137 %pred), DLCO 62 %pred 12/07/17 CTA chest: No pulmonary embolus identified. Anterior left upper lobe ground-glass and nodular consolidation likely representing pneumonitis. Stable bilateral upper lobe pulmonary nodules. Stable emphysema. 01/21/18 CT chest: new masslike architectural distortion and ground-glass attenuation surrounding the index nodule within the RUL. The appearance favors an inflammatory or infectious process. The index nodule in the left upper lobe is slightly decreased in volume when compared with the previous exam favoring a benign process. Attention on follow-up imaging advise. Advanced changes of emphysema (very severe) 06/01/18 CTA chest: No evidence of pulmonary embolism. Waxing and waning bilateral pulmonary nodular process as described. New 8 mm nodular ill-defined focus of opacification over the lingula which may represent an acute inflammatory or infectious process. No effusion. Emphysema 10/19/18 CTA chest: No acute cardiopulmonary disease and no evidence of pulm embolism. Hospitalization 1/12-1/14/2020: COPD exacerbation Hospitalization 2/22-2/27/20: COPD exacerbation, HCAP 02/11/19 CT chest: Progressed masslike consolidation within the RUL in the region of previous waxing and waning nodule, this is contiguous with the pleural surface and demonstrates spiculated margin and probable small focus of central cavitation. Findings could be secondary to infection superimposed on chronic nodule. However cavitary neoplasm is also a concern. Multiple additional lung nodules which have not significantly changed. Small foci of ground-glass density in the RUL may reflect small focus of infection. Mod-severe emphysema 05/01/19 CT chest: Waxing/waning right lung opacities, suggesting infection, including atypical/mycobacterial infection. Improvement of the prior dominant  masslike opacity argues against neoplasm. Stable left upper lobe nodule, likely benign. Aortic Atherosclerosis and Emphysema 11/02/2019 CT chest: Multiple larger pulmonary  nodules and masslike areas seen on the prior study have largely regressed.  Findings indicative of infectious/inflammatory process.  Diffuse bronchial wall thickening with moderate centrilobular and paraseptal emphysema.  Suggestion of left main and three-vessel coronary artery disease. 04/23/2020 CT chest: "Multiple bilateral stable pulmonary nodules. New 7 mm nodule over the posterior left upper lobe. Stable nodularity over an area ofright upper lobe/apical scarring with new 9 mm nodular focus along the most inferior aspect of this scarring. This area has shown to wax and wane on previous exams and is most likely inflammatory". 07/24/2020 CT chest: "Background of marked pulmonary emphysema with stable post infectious or inflammatory changes and with near complete resolution of a presumably infectious or inflammatory nodule in the LEFT upper lobe as compared to previous exam". 01/21/2021 CT chest: Severe emphysematous changes, pulmonary scarring, bilateral calcified granulomas. 01/29/2021 PFTs: Further decline in lung function.  FEV1 0.74 L or 22% predicted, FVC 1.91 L or 42% predicted.  No significant bronchodilator response.  FEV1/FVC 39%.  Patient could not perform lung volumes.  Diffusion capacity severely reduced.  This is consistent with very severe COPD. 02/13/2021 2D echo: LVEF 40 to 45% global hypokinesis, severe hypokinesis of the periapical region.  Grade II diastolic dysfunction, dilated left atrium.   INTERVAL: Last seen by  me on 06/30/2021.   Multiple visits to ED due to congestive heart failure, chest pain and  recent COPD exacerbation seen on 12/22/2021.  Given 4 days of prednisone and azithromycin Z-Pak.  Still feels short of breath.  HPI Patient is a 71 year old former smoker (quit 2020) who follows here for the issue  of very severe COPD, GOLD class IV and chronic respiratory failure with hypoxia on 2 L nasal cannula O2 chronically.  He presents today with increasing shortness of breath since around 2 January.  He was seen in the emergency room on 3 January was given several nebulizer treatments and discharge home on 4 days of prednisone and and Azithromycin Z-Pak.  He is still taking the Z-Pak.  He has 3 tablets of prednisone left.  He has been using DuoNeb 5 times a day.  Has had a dry cough but no purulent sputum production.  No fevers, chills or sweats.  He feels his exacerbation occurred due to exposure to dust while cleaning in his home.  Issues are confounded by the fact that he does have systolic and diastolic heart failure as well.  He has not had lower extremity edema over his usual.  Oxygen saturations at home on supplemental oxygen are ranging between 90 to 93% which are his baseline.  He does have a history of multiple pulmonary nodules last CT chest was performed February 2022 basically showed calcified granulomas and pulmonary scarring.  He is not a candidate for surgical nor invasive procedures.   Review of Systems A 10 point review of systems was performed and it is as noted above otherwise negative.  Patient Active Problem List   Diagnosis Date Noted   COPD exacerbation (HCC) 07/31/2021   Prostate cancer (HCC) 10/11/2020   Elevated PSA 07/28/2020   Iron deficiency anemia 05/20/2020   Goals of care, counseling/discussion    Palliative care by specialist    Healthcare-associated pneumonia 01/28/2019   COPD with acute exacerbation (HCC) 12/31/2018   Generalized anxiety disorder 11/12/2018   Moderate episode of recurrent major depressive disorder (HCC) 11/12/2018   Acute respiratory failure with hypoxia (HCC) 09/29/2018   Acute respiratory failure with hypoxia and hypercapnia (HCC) 09/13/2018   Acute  on chronic respiratory failure with hypoxia and hypercapnia (HCC) 08/24/2018   Chest pain  07/02/2018   Grief reaction 01/13/2018   COPD with hypoxia (HCC) 12/25/2017   Unstable angina (HCC)    Benign neoplasm of ascending colon    Benign neoplasm of descending colon    Benign neoplasm of sigmoid colon    History of colonic polyps    Pulmonary nodules/lesions, multiple 01/05/2015   Bruit of left carotid artery 11/04/2014   Sleep disorder 07/18/2013   Seasonal and perennial allergic rhinitis 05/29/2013   Bradycardia 04/20/2011   SMOKER 07/30/2010   CAD, NATIVE VESSEL 04/23/2010   Hyperlipidemia 03/24/2010   DEPRESSION/ANXIETY 03/24/2010   Chronic systolic heart failure (HCC) 03/24/2010   COPD, very severe (HCC) 03/24/2010   GERD 03/24/2010   Essential hypertension 11/21/2009   Social History   Tobacco Use   Smoking status: Former    Packs/day: 3.00    Years: 42.00    Pack years: 126.00    Types: Cigarettes    Quit date: 02/10/2019    Years since quitting: 2.8   Smokeless tobacco: Never   Tobacco comments:    25 months ago most smoked 4 packs a day .  Substance Use Topics   Alcohol use: No    Alcohol/week: 0.0 standard drinks   Allergies  Allergen Reactions   Prozac [Fluoxetine Hcl] Shortness Of Breath   Effexor Xr [Venlafaxine Hcl Er] Other (See Comments)    "Makes me feel funny"   Wellbutrin [Bupropion] Other (See Comments)    "Makes me feel funny"   Current Meds  Medication Sig   albuterol (VENTOLIN HFA) 108 (90 Base) MCG/ACT inhaler Inhale 2 puffs into the lungs every 6 (six) hours as needed for wheezing or shortness of breath.   aspirin 81 MG tablet Take 81 mg by mouth daily.   atorvastatin (LIPITOR) 80 MG tablet Take 1 tablet (80 mg total) by mouth daily.   azithromycin (ZITHROMAX) 250 MG tablet Take 1 tablet (250 mg total) by mouth daily.   esomeprazole (NEXIUM) 40 MG capsule TAKE ONE CAPSULE BY MOUTH ONCE DAILY   fluticasone (FLONASE) 50 MCG/ACT nasal spray Place 2 sprays into both nostrils daily.   furosemide (LASIX) 40 MG tablet Take 1 tablet  (40 mg total) by mouth daily.   ipratropium-albuterol (DUONEB) 0.5-2.5 (3) MG/3ML SOLN USE 1 VIAL VIA NEBULIZER AND INHALE BY MOUTH 4 TIMES A DAY   loratadine (CLARITIN) 10 MG tablet Take 10 mg by mouth daily.   LORazepam (ATIVAN) 1 MG tablet Take 1 tablet (1 mg total) by mouth every 4 (four) hours while awake.   losartan (COZAAR) 25 MG tablet Take 25 mg by mouth daily.   mirtazapine (REMERON) 15 MG tablet Take 15 mg by mouth at bedtime.   Multiple Vitamin (MULTIVITAMIN) capsule Take 1 capsule by mouth daily.   nitroGLYCERIN (NITROSTAT) 0.4 MG SL tablet Place 1 tablet (0.4 mg total) under the tongue every 5 (five) minutes as needed.   OLANZapine (ZYPREXA) 2.5 MG tablet Take 2.5 mg by mouth at bedtime.   OXYGEN Inhale 2.5 L into the lungs daily.   predniSONE (DELTASONE) 20 MG tablet 3 tablets daily x 4 days   Spacer/Aero-Holding Chambers (AEROCHAMBER MV) inhaler Use as instructed   sucralfate (CARAFATE) 1 g tablet Take 1 g by mouth 4 (four) times daily.   [DISCONTINUED] escitalopram (LEXAPRO) 20 MG tablet Take 2 tablets by mouth every morning.   Immunization History  Administered Date(s) Administered   Fluad Quad(high  Dose 65+) 11/16/2021   Influenza Whole 09/19/2012   Influenza, High Dose Seasonal PF 09/28/2019   Influenza,inj,Quad PF,6+ Mos 10/24/2013, 11/25/2014, 03/02/2016, 10/27/2016, 10/05/2017   Influenza-Unspecified 10/24/2013, 11/25/2014, 03/02/2016, 10/27/2016, 10/05/2017, 09/18/2018   Moderna Sars-Covid-2 Vaccination 01/26/2020, 02/23/2020   PFIZER Comirnaty(Gray Top)Covid-19 Tri-Sucrose Vaccine 12/15/2020, 07/15/2021   PFIZER(Purple Top)SARS-COV-2 Vaccination 01/26/2020, 02/23/2020   Pneumococcal Conjugate-13 11/25/2014   Pneumococcal Polysaccharide-23 08/24/2016, 10/27/2016   Pneumococcal-Unspecified 12/20/2008   Tdap 12/20/2008       Objective:   Physical Exam BP 130/74 (BP Location: Left Arm, Patient Position: Sitting, Cuff Size: Normal)   Pulse (!) 103   Temp 98.2  F (36.8 C) (Oral)   Ht 5\' 6"  (1.676 m)   Wt 200 lb 6.4 oz (90.9 kg)   SpO2 91%   BMI 32.35 kg/m   SpO2: 91 % O2 Device: Nasal cannula O2 Flow Rate (L/min): 3 L/min O2 Type: Continuous O2  GENERAL: Well-developed well-nourished gentleman, chronic use of accessories, no conversational dyspnea. No acute distress.  Presents in transport chair due to dyspnea. Using oxygen via portable concentrator at 2 L pulsed. HEAD: Normocephalic, atraumatic. EYES: Pupils equal, round, reactive to light.  No scleral icterus. MOUTH: Nose/mouth/throat not examined due to masking requirements for COVID 19. NECK: Supple. No thyromegaly. No nodules. No JVD.  Trachea midline, no crepitus. PULMONARY: Very distant breath sounds, no adventitious sounds. CARDIOVASCULAR: S1 and S2. Regular rate and rhythm.  No rubs murmurs or gallops heard. GASTROINTESTINAL: Slightly protuberant abdomen, soft, benign. MUSCULOSKELETAL: No joint deformity, no clubbing, no edema. NEUROLOGIC: Awake, alert, oriented.  No overt focal deficits.  Speech is fluent. SKIN: Intact,warm,dry.  Limited exam: No rashes. PSYCH: Normal mood and behavior.     Assessment & Plan:     ICD-10-CM   1. Stage 4 very severe COPD by GOLD classification (HCC)  J44.9    Continue bronchodilator regimen    2. COPD exacerbation (HCC)  J44.1    Appears to be responding to therapy slowly Complete Azithromycin dosage Medrol Dosepak    3. Chronic hypoxemic respiratory failure (HCC)  J96.11    Continue oxygen supplementation as is Patient compliant with therapy    4. Pulmonary nodules/lesions, multiple  R91.8    Deemed to be benign previously Continue to follow expectantly     Meds ordered this encounter  Medications   methylPREDNISolone (MEDROL DOSEPAK) 4 MG TBPK tablet    Sig: Take as directed in the package.  This is a taper pack.    Dispense:  21 tablet    Refill:  0   See the patient in follow-up in 3 to 4 weeks time he is to contact us  prior to that time should any new difficulties arise.  Gailen Shelter, MD Advanced Bronchoscopy [facility]    *This note was dictated using voice recognition software/Dragon.  Despite best efforts to proofread, errors can occur which can change the meaning. Any transcriptional errors that result from this process are unintentional and may not be fully corrected at the time of dictation.

## 2021-12-24 NOTE — Patient Instructions (Signed)
Complete the antibiotic that you are taking now.  We have sent in a medication called Medrol which is little bit stronger form of prednisone to your pharmacy take it as directed in the package.  We will see him in follow-up in 3 to 4 weeks time with either me or the nurse practitioner at that time.  If you are doing better at that time we will schedule your CT of the chest for follow-up on your nodules.

## 2022-01-05 ENCOUNTER — Ambulatory Visit: Payer: Medicare Other | Admitting: Cardiovascular Disease

## 2022-01-05 NOTE — Progress Notes (Deleted)
Cardiology Office Note   Date:  01/05/2022   ID:  Leslie Sims [place], DOB 01-Aug-1951, MRN [id number]  PCP:  [name], MD  Cardiologist:   Kathlyn Sacramento, MD   No chief complaint on file.     History of Present Illness: Leslie Sims is a 71 y.o. male who presents for a follow-up visit regarding coronary artery disease and ischemic cardiomyopathy.  He has known history of severe COPD on home oxygen, prolonged history of previous tobacco use, anxiety/depression and hyperlipidemia. He is status post anterior myocardial infarction in 2010 with finding of an occluded LAD and a 99% stenosis in the RCA.  He was treated with drug-eluting stent placement to the LAD and RCA at that time.  He subsequently underwent repeat catheterization in November 2017 which showed patent LAD and RCA stents with an EF of 35 to 40%.  He had multiple hospitalizations in 2020  for COPD exacerbations but was finally able to quit smoking and had no hospitalizations since then.  He continues to use 2 L nasal cannula oxygen.    He was diagnosed last year with prostate cancer and was felt to be a poor surgical candidate and thus underwent radiation therapy.  He was noted to have ventricular bigeminy at an office visit with his primary care physician.  I started him on small dose Toprol 25 mg once daily but was subsequently seen in the ED for worsening dyspnea and lower extremity edema.  He was diuresed.  The dose of furosemide was increased to 40 mg daily.  He was switched from metoprolol to bisoprolol due to wheezing.  Bisoprolol was subsequently discontinued due to continued wheezing.   He went to the emergency room on April 11 with atypical chest pain and shortness of breath..  EKG showed no acute changes.  He felt significantly better with steroids and bronchodilators.  Troponin was negative x2, COVID was negative and BNP was normal. He is feeling better overall with less shortness of breath.  No chest  pain.  He continues to be on 2-1/2 L of oxygen.  He is not sure if he is taking 40 mg or 80 mg of furosemide daily.   Past Medical History:  Diagnosis Date   Allergic rhinitis    Anxiety    Back pain    Cancer ([place])    prostate   Colon polyps 08/2012   colonoscopy; multiple colon polyps; repeat colonoscopy in one year.   COPD (chronic obstructive pulmonary disease) ([place])    a. on home O2.   Coronary artery disease 11/2009   a. late presenting ant MI; b. LHC 100% pLAD s/p PCI/DES, 99% mRCA s/p PCI/DES, EF 35%; c. nuclear stress test 05/13: prior ant/inf infarcts w/o ischemia, EF 43%; d. LHC 02/14: widely patent stents with no other obs dz, EF 40%; e. LHC 11/17: LM nl, mLAD 10%, patent LAD stent, dLAD 20%, p-mRCA 10%, mRCA 40%, patent RCA stent, EF 35-45%    Depression    GERD (gastroesophageal reflux disease)    QQPYPPJK(932.6)    Helicobacter pylori (H. pylori)    Hemorrhoids    Hernia    inguinal   HFrEF (heart failure with reduced ejection fraction) ([place])    a. 10/2016 LV gram: EF 35-45%; b. 06/2018 Echo: EF 40-45%. c. 01/2021 Echo: EF 40-45%   Hyperlipidemia    Hypertension    Insomnia    Ischemic cardiomyopathy    a. 10/2016 LV gram: EF 35-45%; b. 06/2018 Echo:  EF 40-45%.   Myalgia    Peptic ulcer    Pulmonary nodules    ST elevation (STEMI) myocardial infarction involving left anterior descending coronary artery ([place]) 11/2009   a. s/p PCI to the LAD   Status post dilation of esophageal narrowing 2000   Tinea pedis    Wears dentures    partial upper    Past Surgical History:  Procedure Laterality Date   Admission  12/20/2012   COPD exacerbation.  [place].   CARDIAC CATHETERIZATION     CARDIAC CATHETERIZATION  02/05/13   [facility]   CARDIAC CATHETERIZATION  10/14   [facility] : patent stents with no change in anatomy. EF: 40$   CARDIAC CATHETERIZATION Left 11/12/2016   Procedure: Left Heart Cath and Coronary Angiography;  Surgeon: Wellington Hampshire, MD;  Location: [place] [facility];  Service: Cardiovascular;  Laterality: Left;   COLONOSCOPY     COLONOSCOPY WITH PROPOFOL N/A 05/18/2016   Procedure: COLONOSCOPY WITH PROPOFOL;  Surgeon: Lucilla Lame, MD;  Location: [facility] ENDOSCOPY;  Service: Endoscopy;  Laterality: N/A;   COLONOSCOPY WITH PROPOFOL N/A 03/19/2020   Procedure: COLONOSCOPY WITH PROPOFOL;  Surgeon: Lin Landsman, MD;  Location: [facility] ENDOSCOPY;  Service: Gastroenterology;  Laterality: N/A;   CORONARY ANGIOPLASTY WITH STENT PLACEMENT  09/2009   LAD 3.0 X23 mm Xience DES, RCA: 4.0 X 15 mm Xience DES   ELECTROMAGNETIC NAVIGATION BROCHOSCOPY N/A 10/27/2017   Procedure: ELECTROMAGNETIC NAVIGATION BRONCHOSCOPY;  Surgeon: Flora Lipps, MD;  Location: [facility] ORS;  Service: Cardiopulmonary;  Laterality: N/A;   ESOPHAGEAL DILATION     ESOPHAGOGASTRODUODENOSCOPY  2008   ESOPHAGOGASTRODUODENOSCOPY  08/20/2012   HERNIA REPAIR  07/20/2012   L inguinal hernia repair   PENILE PROSTHESIS IMPLANT       Current Outpatient Medications  Medication Sig Dispense Refill   albuterol (VENTOLIN HFA) 108 (90 Base) MCG/ACT inhaler Inhale 2 puffs into the lungs every 6 (six) hours as needed for wheezing or shortness of breath. 18 g 2   aspirin 81 MG tablet Take 81 mg by mouth daily.     atorvastatin (LIPITOR) 80 MG tablet Take 1 tablet (80 mg total) by mouth daily. 90 tablet 3   azithromycin (ZITHROMAX) 250 MG tablet Take 1 tablet (250 mg total) by mouth daily. 4 each 0   esomeprazole (NEXIUM) 40 MG capsule TAKE ONE CAPSULE BY MOUTH ONCE DAILY 30 capsule 4   fluticasone (FLONASE) 50 MCG/ACT nasal spray Place 2 sprays into both nostrils daily.     furosemide (LASIX) 40 MG tablet Take 1 tablet (40 mg total) by mouth daily. 90 tablet 3   ipratropium-albuterol (DUONEB) 0.5-2.5 (3) MG/3ML SOLN USE 1 VIAL VIA NEBULIZER AND INHALE BY MOUTH 4 TIMES A DAY 1620 mL 1   loratadine (CLARITIN) 10 MG tablet Take 10 mg by mouth daily.     LORazepam (ATIVAN) 1 MG tablet Take 1 tablet (1 mg total) by  mouth every 4 (four) hours while awake. 30 tablet 0   losartan (COZAAR) 25 MG tablet Take 25 mg by mouth daily.     methylPREDNISolone (MEDROL DOSEPAK) 4 MG TBPK tablet Take as directed in the package.  This is a taper pack. 21 tablet 0   mirtazapine (REMERON) 15 MG tablet Take 15 mg by mouth at bedtime.     Multiple Vitamin (MULTIVITAMIN) capsule Take 1 capsule by mouth daily.     nitroGLYCERIN (NITROSTAT) 0.4 MG SL tablet Place 1 tablet (0.4 mg total) under the tongue every 5 (  five) minutes as needed. 25 tablet 3   OLANZapine (ZYPREXA) 2.5 MG tablet Take 2.5 mg by mouth at bedtime.     OXYGEN Inhale 2.5 L into the lungs daily.     Spacer/Aero-Holding Chambers (AEROCHAMBER MV) inhaler Use as instructed 1 each 2   sucralfate (CARAFATE) 1 g tablet Take 1 g by mouth 4 (four) times daily.     No current facility-administered medications for this visit.    Allergies:   Prozac [fluoxetine hcl], Effexor xr [venlafaxine hcl er], and Wellbutrin [bupropion]    Social History:  The patient  reports that he quit smoking about 2 years ago. His smoking use included cigarettes. He has a 126.00 pack-year smoking history. He has never used smokeless tobacco. He reports that he does not drink alcohol and does not use drugs.   Family History:  The patient's family history includes Alcohol abuse in his sister and sister; COPD in his sister; COPD (age of onset: 63) in his mother; Coronary artery disease (age of onset: 38) in his father; Diabetes in his brother, brother, and father; Heart attack in his brother and father; Heart disease in his father; Hypertension in his brother; Lung cancer in his sister; Penile cancer in his brother.    ROS:  Please see the history of present illness.   Otherwise, review of systems are positive for none.   All other systems are reviewed and negative.    PHYSICAL EXAM: VS:  There were no vitals taken for this visit. , BMI There is no height or weight on file to calculate  BMI. GEN: Well nourished, well developed, in no acute distress  HEENT: normal  Neck: no JVD, carotid bruits, or masses Cardiac: RRR; no murmurs, rubs, or gallops, trace edema  Respiratory:  clear to auscultation bilaterally  with very diminished breath sounds, normal work of breathing GI: soft, nontender, nondistended, + BS MS: no deformity or atrophy  Skin: warm and dry, no rash Neuro:  Strength and sensation are intact Psych: euthymic mood, full affect  EKG:  EKG is  ordered today. I reviewed his EKG which showed sinus rhythm with a PVC, incomplete right bundle branch block and left anterior fascicular block.  Old anterior infarct.   Recent Labs: 10/19/2021: B Natriuretic Peptide 30.1 12/21/2021: ALT 33; BUN 10; Creatinine, Ser 0.91; Hemoglobin 12.8; Platelets 279; Potassium 3.7; Sodium 139    Lipid Panel    Component Value Date/Time   CHOL 165 03/18/2021 1254   CHOL 143 07/27/2017 1525   TRIG 77 03/18/2021 1254   TRIG 65 01/15/2010 0000   HDL 69 03/18/2021 1254   HDL 62 07/27/2017 1525   CHOLHDL 2.4 03/18/2021 1254   VLDL 15 03/18/2021 1254   LDLCALC 81 03/18/2021 1254   LDLCALC 67 07/27/2017 1525      Wt Readings from Last 3 Encounters:  12/24/21 200 lb 6.4 oz (90.9 kg)  11/19/21 202 lb (91.6 kg)  10/19/21 200 lb 9.9 oz (91 kg)        ASSESSMENT AND PLAN:  1.  Coronary artery disease involving native coronary arteries without angina: Status post prior anterior infarct with drug-eluting stent placement to the LAD and RCA in 2010.  Catheterization 2017 showed stable anatomy.  Currently with no convincing symptoms of angina.  Continue medical therapy.  2.    Chronic systolic heart failure due to ischemic cardiomyopathy with mildly reduced EF: We have tried multiple beta-blockers most recently Toprol and bisoprolol.  He did not tolerate any  of them likely due to his very severe COPD.  Recent echocardiogram showed stable ejection fraction of 40 to 45%.  Continue  losartan.  I asked him to double check the dose of furosemide as he should be on 40 mg daily.   3.  Hyperlipidemia: Continue treatment with atorvastatin.  Recommend a target LDL of less than 70.  4.  Severe COPD: Followed by pulmonary: He is on home oxygen.  Pulmonary function testing in February was consistent with very severe COPD.  5.  Asymptomatic PVCs: Did not tolerate beta-blockers.  These seem to be stable.   Disposition:   FU with me in 6 months  Signed,  Kathlyn Sacramento, MD  01/05/2022 2:10 PM    [place]

## 2022-01-21 ENCOUNTER — Other Ambulatory Visit: Payer: Self-pay

## 2022-01-21 ENCOUNTER — Ambulatory Visit (INDEPENDENT_AMBULATORY_CARE_PROVIDER_SITE_OTHER): Payer: Medicare Other | Admitting: Cardiovascular Disease

## 2022-01-21 ENCOUNTER — Encounter: Payer: Self-pay | Admitting: Cardiovascular Disease

## 2022-01-21 VITALS — BP 120/64 | HR 80 | Ht 70.0 in | Wt 201.4 lb

## 2022-01-21 DIAGNOSIS — E785 Hyperlipidemia, unspecified: Secondary | ICD-10-CM

## 2022-01-21 DIAGNOSIS — I493 Ventricular premature depolarization: Secondary | ICD-10-CM

## 2022-01-21 DIAGNOSIS — J449 Chronic obstructive pulmonary disease, unspecified: Secondary | ICD-10-CM

## 2022-01-21 DIAGNOSIS — I5022 Chronic systolic (congestive) heart failure: Secondary | ICD-10-CM

## 2022-01-21 DIAGNOSIS — I2510 Atherosclerotic heart disease of native coronary artery without angina pectoris: Secondary | ICD-10-CM | POA: Diagnosis not present

## 2022-01-21 NOTE — Progress Notes (Signed)
Cardiology Office Note   Date:  01/21/2022   ID:  Damian [place], [place] [birth year], MRN [id number]  PCP:  [name], MD  Cardiologist:   Kathlyn Sacramento, MD   Chief Complaint  Patient presents with   Other    6 Month f/u c/o depression and anxiety but nothing that's related to cardiac concerns. Meds reviewed verbally with pt.      History of Present Illness: Leslie Sims is a 71 y.o. male who presents for a follow-up visit regarding coronary artery disease and ischemic cardiomyopathy.  He has known history of severe COPD on home oxygen, prolonged history of previous tobacco use, anxiety/depression and hyperlipidemia. He is status post anterior myocardial infarction in 2010 with finding of an occluded LAD and a 99% stenosis in the RCA.  He was treated with drug-eluting stent placement to the LAD and RCA at that time.  He subsequently underwent repeat catheterization in November 2017 which showed patent LAD and RCA stents with an EF of 35 to 40%.  He quit smoking in 2020.   He had prostate cancer that was treated with radiation therapy.  He did not tolerate any beta-blocker due to worsening shortness of breath. He has been doing reasonably well from a cardiac standpoint with no chest pain.  He continues to struggle with COPD exacerbations but seems to be stable today.  He also continues to experience uncontrolled symptoms of anxiety and depression.  His medications were adjusted recently by his psychiatrist. He gained significant amount of weight over the last few years since he quit smoking.  Past Medical History:  Diagnosis Date   Allergic rhinitis    Anxiety    Back pain    Cancer ([place])    prostate   Colon polyps 08/2012   colonoscopy; multiple colon polyps; repeat colonoscopy in one year.   COPD (chronic obstructive pulmonary disease) ([place])    a. on home O2.   Coronary artery disease 11/2009   a. late presenting ant MI; b. LHC 100% pLAD s/p PCI/DES, 99% mRCA  s/p PCI/DES, EF 35%; c. nuclear stress test 05/13: prior ant/inf infarcts w/o ischemia, EF 43%; d. LHC 02/14: widely patent stents with no other obs dz, EF 40%; e. LHC 11/17: LM nl, mLAD 10%, patent LAD stent, dLAD 20%, p-mRCA 10%, mRCA 40%, patent RCA stent, EF 35-45%    Depression    GERD (gastroesophageal reflux disease)    PYPPJKDT(267.1)    Helicobacter pylori (H. pylori)    Hemorrhoids    Hernia    inguinal   HFrEF (heart failure with reduced ejection fraction) ([place])    a. 10/2016 LV gram: EF 35-45%; b. 06/2018 Echo: EF 40-45%. c. 01/2021 Echo: EF 40-45%   Hyperlipidemia    Hypertension    Insomnia    Ischemic cardiomyopathy    a. 10/2016 LV gram: EF 35-45%; b. 06/2018 Echo: EF 40-45%.   Myalgia    Peptic ulcer    Pulmonary nodules    ST elevation (STEMI) myocardial infarction involving left anterior descending coronary artery ([place]) 11/2009   a. s/p PCI to the LAD   Status post dilation of esophageal narrowing 2000   Tinea pedis    Wears dentures    partial upper    Past Surgical History:  Procedure Laterality Date   Admission  12/20/2012   COPD exacerbation.  [employer].   CARDIAC CATHETERIZATION     CARDIAC CATHETERIZATION  02/05/13   [facility]   CARDIAC CATHETERIZATION  10/14   [facility] : patent stents with no change in anatomy. EF: 40$   CARDIAC CATHETERIZATION Left 11/12/2016   Procedure: Left Heart Cath and Coronary Angiography;  Surgeon: Wellington Hampshire, MD;  Location: [place] [facility];  Service: Cardiovascular;  Laterality: Left;   COLONOSCOPY     COLONOSCOPY WITH PROPOFOL N/A 05/18/2016   Procedure: COLONOSCOPY WITH PROPOFOL;  Surgeon: Lucilla Lame, MD;  Location: [facility] ENDOSCOPY;  Service: Endoscopy;  Laterality: N/A;   COLONOSCOPY WITH PROPOFOL N/A 03/19/2020   Procedure: COLONOSCOPY WITH PROPOFOL;  Surgeon: Lin Landsman, MD;  Location: [facility] ENDOSCOPY;  Service: Gastroenterology;  Laterality: N/A;   CORONARY ANGIOPLASTY WITH STENT PLACEMENT  09/2009   LAD 3.0 X23 mm  Xience DES, RCA: 4.0 X 15 mm Xience DES   ELECTROMAGNETIC NAVIGATION BROCHOSCOPY N/A 10/27/2017   Procedure: ELECTROMAGNETIC NAVIGATION BRONCHOSCOPY;  Surgeon: Flora Lipps, MD;  Location: [facility] ORS;  Service: Cardiopulmonary;  Laterality: N/A;   ESOPHAGEAL DILATION     ESOPHAGOGASTRODUODENOSCOPY  2008   ESOPHAGOGASTRODUODENOSCOPY  08/20/2012   HERNIA REPAIR  07/20/2012   L inguinal hernia repair   PENILE PROSTHESIS IMPLANT       Current Outpatient Medications  Medication Sig Dispense Refill   albuterol (VENTOLIN HFA) 108 (90 Base) MCG/ACT inhaler Inhale 2 puffs into the lungs every 6 (six) hours as needed for wheezing or shortness of breath. 18 g 2   aspirin 81 MG tablet Take 81 mg by mouth daily.     atorvastatin (LIPITOR) 80 MG tablet Take 1 tablet (80 mg total) by mouth daily. 90 tablet 3   azithromycin (ZITHROMAX) 250 MG tablet Take by mouth as directed.     busPIRone (BUSPAR) 7.5 MG tablet Take by mouth as directed.     doxycycline (VIBRA-TABS) 100 MG tablet Take 100 mg by mouth 2 (two) times daily.     esomeprazole (NEXIUM) 40 MG capsule TAKE ONE CAPSULE BY MOUTH ONCE DAILY 30 capsule 4   fluticasone (FLONASE) 50 MCG/ACT nasal spray Place 2 sprays into both nostrils daily.     furosemide (LASIX) 40 MG tablet Take 1 tablet (40 mg total) by mouth daily. 90 tablet 3   ipratropium-albuterol (DUONEB) 0.5-2.5 (3) MG/3ML SOLN USE 1 VIAL VIA NEBULIZER AND INHALE BY MOUTH 4 TIMES A DAY 1620 mL 1   LORazepam (ATIVAN) 1 MG tablet Take 1 tablet (1 mg total) by mouth every 4 (four) hours while awake. 30 tablet 0   losartan (COZAAR) 25 MG tablet Take 25 mg by mouth daily.     methylPREDNISolone (MEDROL DOSEPAK) 4 MG TBPK tablet Take as directed in the package.  This is a taper pack. 21 tablet 0   mirtazapine (REMERON) 15 MG tablet Take 15 mg by mouth at bedtime.     Multiple Vitamin (MULTIVITAMIN) capsule Take 1 capsule by mouth daily.     nitroGLYCERIN (NITROSTAT) 0.4 MG SL tablet Place 1 tablet  (0.4 mg total) under the tongue every 5 (five) minutes as needed. 25 tablet 3   OLANZapine (ZYPREXA) 2.5 MG tablet Take 2.5 mg by mouth at bedtime.     OXYGEN Inhale 2.5 L into the lungs daily.     Spacer/Aero-Holding Chambers (AEROCHAMBER MV) inhaler Use as instructed 1 each 2   loratadine (CLARITIN) 10 MG tablet Take 10 mg by mouth daily. (Patient not taking: Reported on 01/21/2022)     sucralfate (CARAFATE) 1 g tablet Take 1 g by mouth 4 (four) times daily. (Patient not taking: Reported on 01/21/2022)  No current facility-administered medications for this visit.    Allergies:   Prozac [fluoxetine hcl], Effexor xr [venlafaxine hcl er], and Wellbutrin [bupropion]    Social History:  The patient  reports that he quit smoking about 2 years ago. His smoking use included cigarettes. He has a 126.00 pack-year smoking history. He has never used smokeless tobacco. He reports that he does not drink alcohol and does not use drugs.   Family History:  The patient's family history includes Alcohol abuse in his sister and sister; COPD in his sister; COPD (age of onset: 68) in his mother; Coronary artery disease (age of onset: 48) in his father; Diabetes in his brother, brother, and father; Heart attack in his brother and father; Heart disease in his father; Hypertension in his brother; Lung cancer in his sister; Penile cancer in his brother.    ROS:  Please see the history of present illness.   Otherwise, review of systems are positive for none.   All other systems are reviewed and negative.    PHYSICAL EXAM: VS:  BP 120/64 (BP Location: Left Arm, Patient Position: Sitting, Cuff Size: Normal)    Ht 5\' 10"  (1.778 m)    Wt 201 lb 6 oz (91.3 kg)    SpO2 93%    BMI 28.89 kg/m  , BMI Body mass index is 28.89 kg/m. GEN: Well nourished, well developed, in no acute distress  HEENT: normal  Neck: no JVD, carotid bruits, or masses Cardiac: RRR; no murmurs, rubs, or gallops, trace edema  Respiratory:  clear to  auscultation bilaterally  with very diminished breath sounds, normal work of breathing GI: soft, nontender, nondistended, + BS MS: no deformity or atrophy  Skin: warm and dry, no rash Neuro:  Strength and sensation are intact Psych: euthymic mood, full affect  EKG:  EKG is  ordered today. I reviewed his EKG which showed sinus rhythm was PVCs, left axis deviation and old septal infarct.   Recent Labs: 10/19/2021: B Natriuretic Peptide 30.1 12/21/2021: ALT 33; BUN 10; Creatinine, Ser 0.91; Hemoglobin 12.8; Platelets 279; Potassium 3.7; Sodium 139    Lipid Panel    Component Value Date/Time   CHOL 165 03/18/2021 1254   CHOL 143 07/27/2017 1525   TRIG 77 03/18/2021 1254   TRIG 65 01/15/2010 0000   HDL 69 03/18/2021 1254   HDL 62 07/27/2017 1525   CHOLHDL 2.4 03/18/2021 1254   VLDL 15 03/18/2021 1254   LDLCALC 81 03/18/2021 1254   LDLCALC 67 07/27/2017 1525      Wt Readings from Last 3 Encounters:  01/21/22 201 lb 6 oz (91.3 kg)  12/24/21 200 lb 6.4 oz (90.9 kg)  11/19/21 202 lb (91.6 kg)        ASSESSMENT AND PLAN:  1.  Coronary artery disease involving native coronary arteries without angina: Status post prior anterior infarct with drug-eluting stent placement to the LAD and RCA in 2010.  Catheterization 2017 showed stable anatomy.  Currently with no convincing symptoms of angina.  Continue medical therapy.  2.    Chronic systolic heart failure due to ischemic cardiomyopathy with mildly reduced EF: We have tried multiple beta-blockers most recently Toprol and bisoprolol.  He did not tolerate any of them likely due to his very severe COPD.  Recent echocardiogram showed stable ejection fraction of 40 to 45%.  Continue losartan.  Continue furosemide 40 mg once daily.   3.  Hyperlipidemia: I reviewed most recent lipid profile done in March 2022 which showed  an LDL of 81.  His LDL before that was consistently below 70.  Continue high-dose atorvastatin.  4.  Severe COPD:  Followed by pulmonary: He is on home oxygen.    5.  Asymptomatic PVCs: Did not tolerate beta-blockers.  These seem to be stable.  6.  Overweight with a BMI of 29: His weight gain was after he quit smoking.  I discussed with him the importance of healthy diet.  It is hard for him to exercise with underlying COPD.   Disposition:   FU with me in 6 months  Signed,  Kathlyn Sacramento, MD  01/21/2022 10:24 AM    [place]

## 2022-01-21 NOTE — Patient Instructions (Signed)
Medication Instructions:  °Your physician recommends that you continue on your current medications as directed. Please refer to the Current Medication list given to you today. ° °*If you need a refill on your cardiac medications before your next appointment, please call your pharmacy* ° ° °Lab Work: °None ordered ° °If you have labs (blood work) drawn today and your tests are completely normal, you will receive your results only by: °MyChart Message (if you have MyChart) OR °A paper copy in the mail °If you have any lab test that is abnormal or we need to change your treatment, we will call you to review the results. ° ° °Testing/Procedures: °None ordered ° ° °Follow-Up: °At [facility], you and your health needs are our priority.  As part of our continuing mission to provide you with exceptional heart care, we have created designated Provider Care Teams.  These Care Teams include your primary Cardiologist (physician) and Advanced Practice Providers (APPs -  Physician Assistants and Nurse Practitioners) who all work together to provide you with the care you need, when you need it. ° °We recommend signing up for the patient portal called "MyChart".  Sign up information is provided on this After Visit Summary.  MyChart is used to connect with patients for Virtual Visits (Telemedicine).  Patients are able to view lab/test results, encounter notes, upcoming appointments, etc.  Non-urgent messages can be sent to your provider as well.   °To learn more about what you can do with MyChart, go to [email or web address].   ° °Your next appointment:   °Your physician wants you to follow-up in: 6 months You will receive a reminder letter in the mail two months in advance. If you don't receive a letter, please call our office to schedule the follow-up appointment. ° ° °The format for your next appointment:   °In Person ° °Provider:   °You may see Muhammad Arida, MD or one of the following Advanced Practice Providers on your  designated Care Team:   °Christopher Berge, NP °Ryan Dunn, PA-C °Cadence Furth, PA-C{ ° ° ° °Other Instructions °N/A ° °

## 2022-01-24 ENCOUNTER — Other Ambulatory Visit: Payer: Self-pay | Admitting: Nurse Practitioner

## 2022-01-28 ENCOUNTER — Ambulatory Visit (INDEPENDENT_AMBULATORY_CARE_PROVIDER_SITE_OTHER): Payer: Medicare Other | Admitting: Pulmonary Disease

## 2022-01-28 ENCOUNTER — Encounter: Payer: Self-pay | Admitting: Pulmonary Disease

## 2022-01-28 ENCOUNTER — Other Ambulatory Visit: Payer: Self-pay

## 2022-01-28 VITALS — BP 122/86 | HR 95 | Temp 98.6°F | Ht 70.0 in | Wt 200.4 lb

## 2022-01-28 DIAGNOSIS — J9611 Chronic respiratory failure with hypoxia: Secondary | ICD-10-CM

## 2022-01-28 DIAGNOSIS — R918 Other nonspecific abnormal finding of lung field: Secondary | ICD-10-CM

## 2022-01-28 DIAGNOSIS — J449 Chronic obstructive pulmonary disease, unspecified: Secondary | ICD-10-CM

## 2022-01-28 DIAGNOSIS — Z87891 Personal history of nicotine dependence: Secondary | ICD-10-CM

## 2022-01-28 NOTE — Patient Instructions (Signed)
Continue using your nebulizer medication.  Continue your oxygen.   See you in follow-up in 2 to 3 months time call sooner should any new problems arise.

## 2022-01-28 NOTE — Progress Notes (Signed)
Subjective:    Patient ID: Leslie Sims, male    DOB: 1951-09-29, 71 y.o.   MRN: [id number] Patient Care Team: [name], MD as PCP - General (Family Medicine) Wellington Hampshire, MD as PCP - Cardiology (Cardiology) [name], MD (Family Medicine) Wellington Hampshire, MD as Consulting Physician (Cardiology) Telford Nab, RN as Registered Nurse [name], MD as Consulting Physician (Pulmonary Disease) Noreene Filbert, MD as Radiation Oncologist (Radiation Oncology) Clent Jacks, RN as Oncology Nurse Navigator [name], MD as Consulting Physician (Oncology)  Chief Complaint  Patient presents with   Follow-up   Requesting MD/Service: [name], MD Date of initial consultation: 08/10/16 by Dr. [name] Reason for consultation: COPD, smoker   PT PROFILE: 33 M, former smoker quit 02/10/2019 with severe emphysema seen by multiple pulmonologists in past ([place], [place], [place], [place]) referred for further eval and mgmt of COPD.  Previously seen by Dr. [name].   DATA: 07/24/12 Office Spirometry: Severe obstruction (FEV1 31% pred) 08/31/13 Office Spirometry: very severe obstruction (FEV1 25% pred) 06/18/15 CT chest: Severe emphysema 03/03/17 Overnight oximetry: Lowest SpO2 72%. 95% of time SpO2 was below 90% 03/03/17 6MWT: 384 meters. Desat to 84% 10/04/17 CT chest: Two new irregularly marginated nodules, one in the RUL, and a second in the mid LUL worrisome for either synchronous lung ca or metastatic involvement of the lungs. The two small nodules described on the prior dictated report have resolved in the left superior segment and most likely were postinflammatory. Diffuse centrilobular emphysema. 10/13/17 PET scan: The new left upper lobe nodule is FDG avid consistent with malignancy. Recommend tissue confirmation. The new nodule in the right upper lobe demonstrates low level uptake. While the level of uptake suggests the possibility of an inflammatory  or infectious process, a low-grade malignancy is not excluded. Given the suspected malignancy on the left, tissue confirmation should be considered on the right despite the low level of uptake. No distant metastatic disease identified 10/27/17 ENB ([place]): Transbronchial Fine Needle Aspirations 21G X7, Transbronchial Forceps Biopsy X6. ALVEOLATED LUNG TISSUE WITH HEMOSIDERIN LADEN MACROPHAGES.  NEGATIVE FOR ATYPIA AND MALIGNANCY 11/01/17 PFTs: FVC: 2.96 > 3.14 L (68 > 72 %pred), FEV1: 0.94 > 0.93 L (27 %pred), FEV1/FVC: 32%, TLC: 9.04 L (137 %pred), DLCO 62 %pred 12/07/17 CTA chest: No pulmonary embolus identified. Anterior left upper lobe ground-glass and nodular consolidation likely representing pneumonitis. Stable bilateral upper lobe pulmonary nodules. Stable emphysema. 01/21/18 CT chest: new masslike architectural distortion and ground-glass attenuation surrounding the index nodule within the RUL. The appearance favors an inflammatory or infectious process. The index nodule in the left upper lobe is slightly decreased in volume when compared with the previous exam favoring a benign process. Attention on follow-up imaging advise. Advanced changes of emphysema (very severe) 06/01/18 CTA chest: No evidence of pulmonary embolism. Waxing and waning bilateral pulmonary nodular process as described. New 8 mm nodular ill-defined focus of opacification over the lingula which may represent an acute inflammatory or infectious process. No effusion. Emphysema 10/19/18 CTA chest: No acute cardiopulmonary disease and no evidence of pulm embolism. Hospitalization 1/12-1/14/2020: COPD exacerbation Hospitalization 2/22-2/27/20: COPD exacerbation, HCAP 02/11/19 CT chest: Progressed masslike consolidation within the RUL in the region of previous waxing and waning nodule, this is contiguous with the pleural surface and demonstrates spiculated margin and probable small focus of central cavitation. Findings could be secondary  to infection superimposed on chronic nodule. However cavitary neoplasm is also a concern. Multiple additional lung nodules  which have not significantly changed. Small foci of ground-glass density in the RUL may reflect small focus of infection. Mod-severe emphysema 05/01/19 CT chest: Waxing/waning right lung opacities, suggesting infection, including atypical/mycobacterial infection. Improvement of the prior dominant masslike opacity argues against neoplasm. Stable left upper lobe nodule, likely benign. Aortic Atherosclerosis and Emphysema 11/02/2019 CT chest: Multiple larger pulmonary nodules and masslike areas seen on the prior study have largely regressed.  Findings indicative of infectious/inflammatory process.  Diffuse bronchial wall thickening with moderate centrilobular and paraseptal emphysema.  Suggestion of left main and three-vessel coronary artery disease. 04/23/2020 CT chest: "Multiple bilateral stable pulmonary nodules. New 7 mm nodule over the posterior left upper lobe. Stable nodularity over an area ofright upper lobe/apical scarring with new 9 mm nodular focus along the most inferior aspect of this scarring. This area has shown to wax and wane on previous exams and is most likely inflammatory". 07/24/2020 CT chest: "Background of marked pulmonary emphysema with stable post infectious or inflammatory changes and with near complete resolution of a presumably infectious or inflammatory nodule in the LEFT upper lobe as compared to previous exam". 01/21/2021 CT chest: Severe emphysematous changes, pulmonary scarring, bilateral calcified granulomas. 01/29/2021 PFTs: Further decline in lung function.  FEV1 0.74 L or 22% predicted, FVC 1.91 L or 42% predicted.  No significant bronchodilator response.  FEV1/FVC 39%.  Patient could not perform lung volumes.  Diffusion capacity severely reduced.  This is consistent with very severe COPD. 02/13/2021 2D echo: LVEF 40 to 45% global hypokinesis, severe  hypokinesis of the periapical region.  Grade II diastolic dysfunction, dilated left atrium.   INTERVAL: Last seen by  me on 12/24/2021.  Had just had visit to the ED on 22 December 2021.  Multiple visits to ED due to congestive heart failure, chest pain and COPD exacerbations.    HPI Leslie Sims is a 71 year old former smoker (quit 2020) who follows here for the issue of very severe COPD and chronic respiratory failure with hypoxia.  Patient is well compensated on 2 L/min nasal cannula O2 with regards to his respiratory failure.  Since his last visit in January he has not had another exacerbation.  Completed Medrol dose pack and Azithromycin.  He remains abstinent of tobacco.  He is maintained on DuoNeb 4 times a day.  He states that his reflux symptoms are better controlled on Nexium.  He does not have breath-holding capacity to use metered-dose or dry powder inhalers.  This is not surprising given that his FEV1 has declined and is now only at 22%.  He does feel better when he uses his nebulizer. Dyspnea is otherwise about baseline.  He feels he has "good days and bad days ".     Patient is also been followed for issues with multiple pulmonary nodules.  Most recent CT scan of the chest  showed that these are calcified granulomas.  He is not a candidate for invasive procedures due to the very severe nature of his COPD.   Review of Systems A 10 point review of systems was performed and it is as noted above otherwise negative.  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   COPD exacerbation ([place]) 07/31/2021   Prostate cancer ([place]) 10/11/2020   Elevated PSA  07/28/2020   Iron deficiency anemia 05/20/2020   Goals of  care, counseling/discussion    Palliative care by specialist    Healthcare-associated pneumonia 01/28/2019   COPD with acute exacerbation ([place]) 12/31/2018   Generalized anxiety disorder 11/12/2018   Moderate episode of recurrent major depressive disorder ([place]) 11/12/2018   Chronic respiratory failure with hypoxia ([place]) 09/29/2018   Acute respiratory failure with hypoxia and hypercapnia ([place]) 09/13/2018   Acute on chronic respiratory failure with hypoxia and hypercapnia (HCC) 08/24/2018   Atypical chest pain 07/02/2018   Grief reaction 01/13/2018   COPD with hypoxia ([place]) 12/25/2017   Unstable angina (HCC)    Benign neoplasm of ascending colon    Benign neoplasm of descending colon    Benign neoplasm of sigmoid colon    History of colonic polyps    Pulmonary nodules/lesions, multiple 01/05/2015   Bruit of left carotid artery 11/04/2014   Sleep disorder 07/18/2013   Seasonal and perennial allergic rhinitis 05/29/2013   Bradycardia 04/20/2011   SMOKER 07/30/2010   CAD, NATIVE VESSEL 04/23/2010   Hyperlipidemia 03/24/2010   DEPRESSION/ANXIETY 03/24/2010   Chronic heart failure with preserved ejection fraction (HFpEF) ([place]) 03/24/2010   COPD, very severe ([place]) 03/24/2010   GERD 03/24/2010   Essential hypertension 11/21/2009   Social History   Tobacco Use   Smoking status: Former    Packs/day: 3.00    Years: 42.00    Total pack years: 126.00    Types: Cigarettes    Quit date: 02/10/2019    Years since quitting: 3.9   Smokeless tobacco: Never   Tobacco comments:    25 months ago most smoked 4 packs a day .  Substance Use Topics   Alcohol use: No    Alcohol/week: 0.0 standard drinks of alcohol   Allergies  Allergen Reactions   Prozac [Fluoxetine Hcl] Shortness Of Breath   Hydroxyzine    Bupropion Other (See Comments)    "Makes me feel funny" Other reaction(s): Other (See Comments) Unknown/"made me feel real funny" "Makes me feel funny" "Makes me feel funny" Other  reaction(s): Other (See Comments) Unknown/"made me feel real funny" "Makes me feel funny"   Effexor Xr [Venlafaxine Hcl Er] Other (See Comments)    "Makes me feel funny"   Current Meds  Medication Sig   aspirin 81 MG tablet Take 81 mg by mouth daily.   atorvastatin (LIPITOR) 80 MG tablet Take 1 tablet (80 mg total) by mouth daily.   fluticasone (FLONASE) 50 MCG/ACT nasal spray Place 2 sprays into both nostrils daily.   loratadine (CLARITIN) 10 MG tablet Take 10 mg by mouth daily.   Multiple Vitamin (MULTIVITAMIN) capsule Take 1 capsule by mouth daily.   OXYGEN Inhale 2.5 L into the lungs daily.   Spacer/Aero-Holding Chambers (AEROCHAMBER MV) inhaler Use as instructed   sucralfate (CARAFATE) 1 g tablet Take 1 g by mouth 2 (two) times daily with a meal.   [DISCONTINUED] albuterol (VENTOLIN HFA) 108 (90 Base) MCG/ACT inhaler Inhale 2 puffs into the lungs every 6 (six) hours as needed for wheezing or shortness of breath.   [DISCONTINUED] azithromycin (ZITHROMAX) 250 MG tablet Take by mouth as directed.   [DISCONTINUED] busPIRone (BUSPAR) 7.5 MG tablet Take by mouth as directed. (Patient not taking: Reported on 07/09/2022)   [DISCONTINUED] doxycycline (VIBRA-TABS) 100 MG tablet Take 100 mg by mouth 2 (two) times daily.   [DISCONTINUED] esomeprazole (NEXIUM) 40 MG capsule TAKE ONE CAPSULE BY MOUTH ONCE DAILY   [DISCONTINUED] furosemide (LASIX) 40 MG  tablet TAKE ONE TABLET BY MOUTH ONCE DAILY   [DISCONTINUED] ipratropium-albuterol (DUONEB) 0.5-2.5 (3) MG/3ML SOLN USE 1 VIAL VIA NEBULIZER AND INHALE BY MOUTH 4 TIMES A DAY   [DISCONTINUED] LORazepam (ATIVAN) 1 MG tablet Take 1 tablet (1 mg total) by mouth every 4 (four) hours while awake. (Patient taking differently: Take 1 mg by mouth 3 (three) times daily.)   [DISCONTINUED] losartan (COZAAR) 25 MG tablet Take 25 mg by mouth daily.   [DISCONTINUED] methylPREDNISolone (MEDROL DOSEPAK) 4 MG TBPK tablet Take as directed in the package.  This is a taper  pack.   [DISCONTINUED] mirtazapine (REMERON) 15 MG tablet Take 15 mg by mouth at bedtime.   [DISCONTINUED] nitroGLYCERIN (NITROSTAT) 0.4 MG SL tablet Place 1 tablet (0.4 mg total) under the tongue every 5 (five) minutes as needed.   [DISCONTINUED] OLANZapine (ZYPREXA) 2.5 MG tablet Take 2.5 mg by mouth at bedtime.   Immunization History  Administered Date(s) Administered   Fluad Quad(high Dose 65+) 11/16/2021   Influenza Whole 09/19/2012   Influenza, High Dose Seasonal PF 09/28/2019   Influenza,inj,Quad PF,6+ Mos 10/24/2013, 11/25/2014, 03/02/2016, 10/27/2016, 10/05/2017   Influenza-Unspecified 10/24/2013, 11/25/2014, 03/02/2016, 10/27/2016, 10/05/2017, 09/18/2018, 09/28/2019, 09/30/2020   Moderna Sars-Covid-2 Vaccination 01/26/2020, 02/23/2020   PFIZER Comirnaty(Gray Top)Covid-19 Tri-Sucrose Vaccine 12/15/2020, 07/15/2021   PFIZER(Purple Top)SARS-COV-2 Vaccination 01/26/2020, 02/23/2020   PNEUMOCOCCAL CONJUGATE-20 03/22/2022   Pfizer Covid-19 Vaccine Bivalent Booster 31yrs & up 01/14/2022   Pneumococcal Conjugate-13 11/25/2014   Pneumococcal Polysaccharide-23 08/24/2016, 10/27/2016   Pneumococcal-Unspecified 12/20/2008   Tdap 12/20/2008      Objective:   Physical Exam BP 122/86 (BP Location: Left Arm, Patient Position: Sitting, Cuff Size: Normal)   Pulse 95   Temp 98.6 F (37 C) (Oral)   Ht 5\' 10"  (1.778 m)   Wt 200 lb 6.4 oz (90.9 kg)   SpO2 95%   BMI 28.75 kg/m   SpO2: 95 % O2 Device: Nasal cannula O2 Flow Rate (L/min): 2 L/min O2 Type: Pulse O2  GENERAL: Well-developed well-nourished gentleman, chronic use of accessories, no conversational dyspnea. No acute distress.  Presents in transport chair due to dyspnea. Using oxygen via portable concentrator at 2 L pulsed. HEAD: Normocephalic, atraumatic. EYES: Pupils equal, round, reactive to light.  No scleral icterus. MOUTH: Nose/mouth/throat not examined due to masking requirements for COVID 19. NECK: Supple. No  thyromegaly. No nodules. No JVD.  Trachea midline, no crepitus. PULMONARY: Very distant breath sounds, no adventitious sounds. CARDIOVASCULAR: S1 and S2. Regular rate and rhythm.  No rubs murmurs or gallops heard. GASTROINTESTINAL: Slightly protuberant abdomen, soft, benign. MUSCULOSKELETAL: No joint deformity, no clubbing, no edema. NEUROLOGIC: Awake, alert, oriented.  No overt focal deficits.  Speech is fluent. SKIN: Intact,warm,dry.  Limited exam: No rashes. PSYCH: Normal mood and behavior.      Assessment & Plan:     ICD-10-CM   1. Stage 4 very severe COPD by GOLD classification ([place])  J44.9    Continue DuoNeb 4 times a day Continue as needed albuterol    2. Chronic hypoxemic respiratory failure (HCC)  J96.11    Continue oxygen at 2 L/min Patient compliant with therapy    3. Pulmonary nodules/lesions, multiple  R91.8    These are granulomas No further imaging necessary    4. Former smoker  Z87.891    No evidence of relapse     The patient appears to have resolved his most recent exacerbation.  He appears fairly well compensated today.  Will see him in follow-up in 2 to  3 months time he is to call sooner should any new problems arise.  Leslie Don, MD Advanced Bronchoscopy PCCM [facility]    *This note was dictated using voice recognition software/Dragon.  Despite best efforts to proofread, errors can occur which can change the meaning. Any transcriptional errors that result from this process are unintentional and may not be fully corrected at the time of dictation.

## 2022-02-17 ENCOUNTER — Other Ambulatory Visit: Payer: Self-pay

## 2022-02-17 MED ORDER — BUDESONIDE 0.5 MG/2ML IN SUSP: 0.5000 mg | Freq: Every day | RESPIRATORY_TRACT | 12 refills | Status: DC

## 2022-02-17 MED ORDER — BUDESONIDE 0.5 MG/2ML IN SUSP: 0.5000 mg | Freq: Two times a day (BID) | RESPIRATORY_TRACT | 12 refills | Status: DC

## 2022-02-17 NOTE — Telephone Encounter (Signed)
Pulmicort sent to [facility] as requested by pharmacy.  ?Nothing further needed.  ?

## 2022-02-28 ENCOUNTER — Emergency Department
Admission: EM | Admit: 2022-02-28 | Discharge: 2022-02-28 | Disposition: A | Discharge status: Home / Self Care | Payer: Medicare Other | Attending: Emergency Medicine | Admitting: Emergency Medicine

## 2022-02-28 ENCOUNTER — Other Ambulatory Visit: Payer: Self-pay

## 2022-02-28 ENCOUNTER — Emergency Department: Payer: Medicare Other

## 2022-02-28 DIAGNOSIS — I110 Hypertensive heart disease with heart failure: Secondary | ICD-10-CM | POA: Diagnosis not present

## 2022-02-28 DIAGNOSIS — I2510 Atherosclerotic heart disease of native coronary artery without angina pectoris: Secondary | ICD-10-CM | POA: Insufficient documentation

## 2022-02-28 DIAGNOSIS — J441 Chronic obstructive pulmonary disease with (acute) exacerbation: Secondary | ICD-10-CM | POA: Diagnosis not present

## 2022-02-28 DIAGNOSIS — R0602 Shortness of breath: Secondary | ICD-10-CM | POA: Diagnosis present

## 2022-02-28 DIAGNOSIS — I509 Heart failure, unspecified: Secondary | ICD-10-CM | POA: Insufficient documentation

## 2022-02-28 LAB — TROPONIN I (HIGH SENSITIVITY)
Troponin I (High Sensitivity): 11 ng/L (ref <18)
Troponin I (High Sensitivity): 8 ng/L (ref <18)

## 2022-02-28 LAB — BASIC METABOLIC PANEL
Anion gap: 13 (ref 5–15)
BUN: 19 mg/dL (ref 8–23)
CO2: 30 mmol/L (ref 22–32)
Calcium: 9.1 mg/dL (ref 8.9–10.3)
Chloride: 99 mmol/L (ref 98–111)
Creatinine, Ser: 1 mg/dL (ref 0.61–1.24)
GFR, Estimated: >60 mL/min (ref >60)
Glucose, Bld: 179 mg/dL — ABNORMAL HIGH (ref 70–99)
Potassium: 3.4 mmol/L — ABNORMAL LOW (ref 3.5–5.1)
Sodium: 142 mmol/L (ref 135–145)

## 2022-02-28 LAB — CBC WITH DIFFERENTIAL/PLATELET
Abs Immature Granulocytes: 0.09 10*3/uL — ABNORMAL HIGH (ref 0.00–0.07)
Basophils Absolute: 0 10*3/uL (ref 0.0–0.1)
Basophils Relative: 0 %
Eosinophils Absolute: 0 10*3/uL (ref 0.0–0.5)
Eosinophils Relative: 0 %
HCT: 40.9 % (ref 39.0–52.0)
Hemoglobin: 12.9 g/dL — ABNORMAL LOW (ref 13.0–17.0)
Immature Granulocytes: 1 %
Lymphocytes Relative: 6 %
Lymphs Abs: 0.5 10*3/uL — ABNORMAL LOW (ref 0.7–4.0)
MCH: 28.4 pg (ref 26.0–34.0)
MCHC: 31.5 g/dL (ref 30.0–36.0)
MCV: 89.9 fL (ref 80.0–100.0)
Monocytes Absolute: 0.3 10*3/uL (ref 0.1–1.0)
Monocytes Relative: 3 %
Neutro Abs: 8.8 10*3/uL — ABNORMAL HIGH (ref 1.7–7.7)
Neutrophils Relative %: 90 %
Platelets: 236 10*3/uL (ref 150–400)
RBC: 4.55 MIL/uL (ref 4.22–5.81)
RDW: 13.7 % (ref 11.5–15.5)
WBC: 9.8 10*3/uL (ref 4.0–10.5)
nRBC: 0 % (ref 0.0–0.2)

## 2022-02-28 IMAGING — CR DG CHEST 2V
1 series · 2 of 2 positions shown · non-contrast
Comparison: [DATE]

CLINICAL DATA: Chest discomfort.  Shortness of breath and COPD

EXAM:
CHEST - 2 VIEW

[Series 1: dg chest 2 view · 0.14mm/px · 2 of 2 slices shown]
[im 1/2]
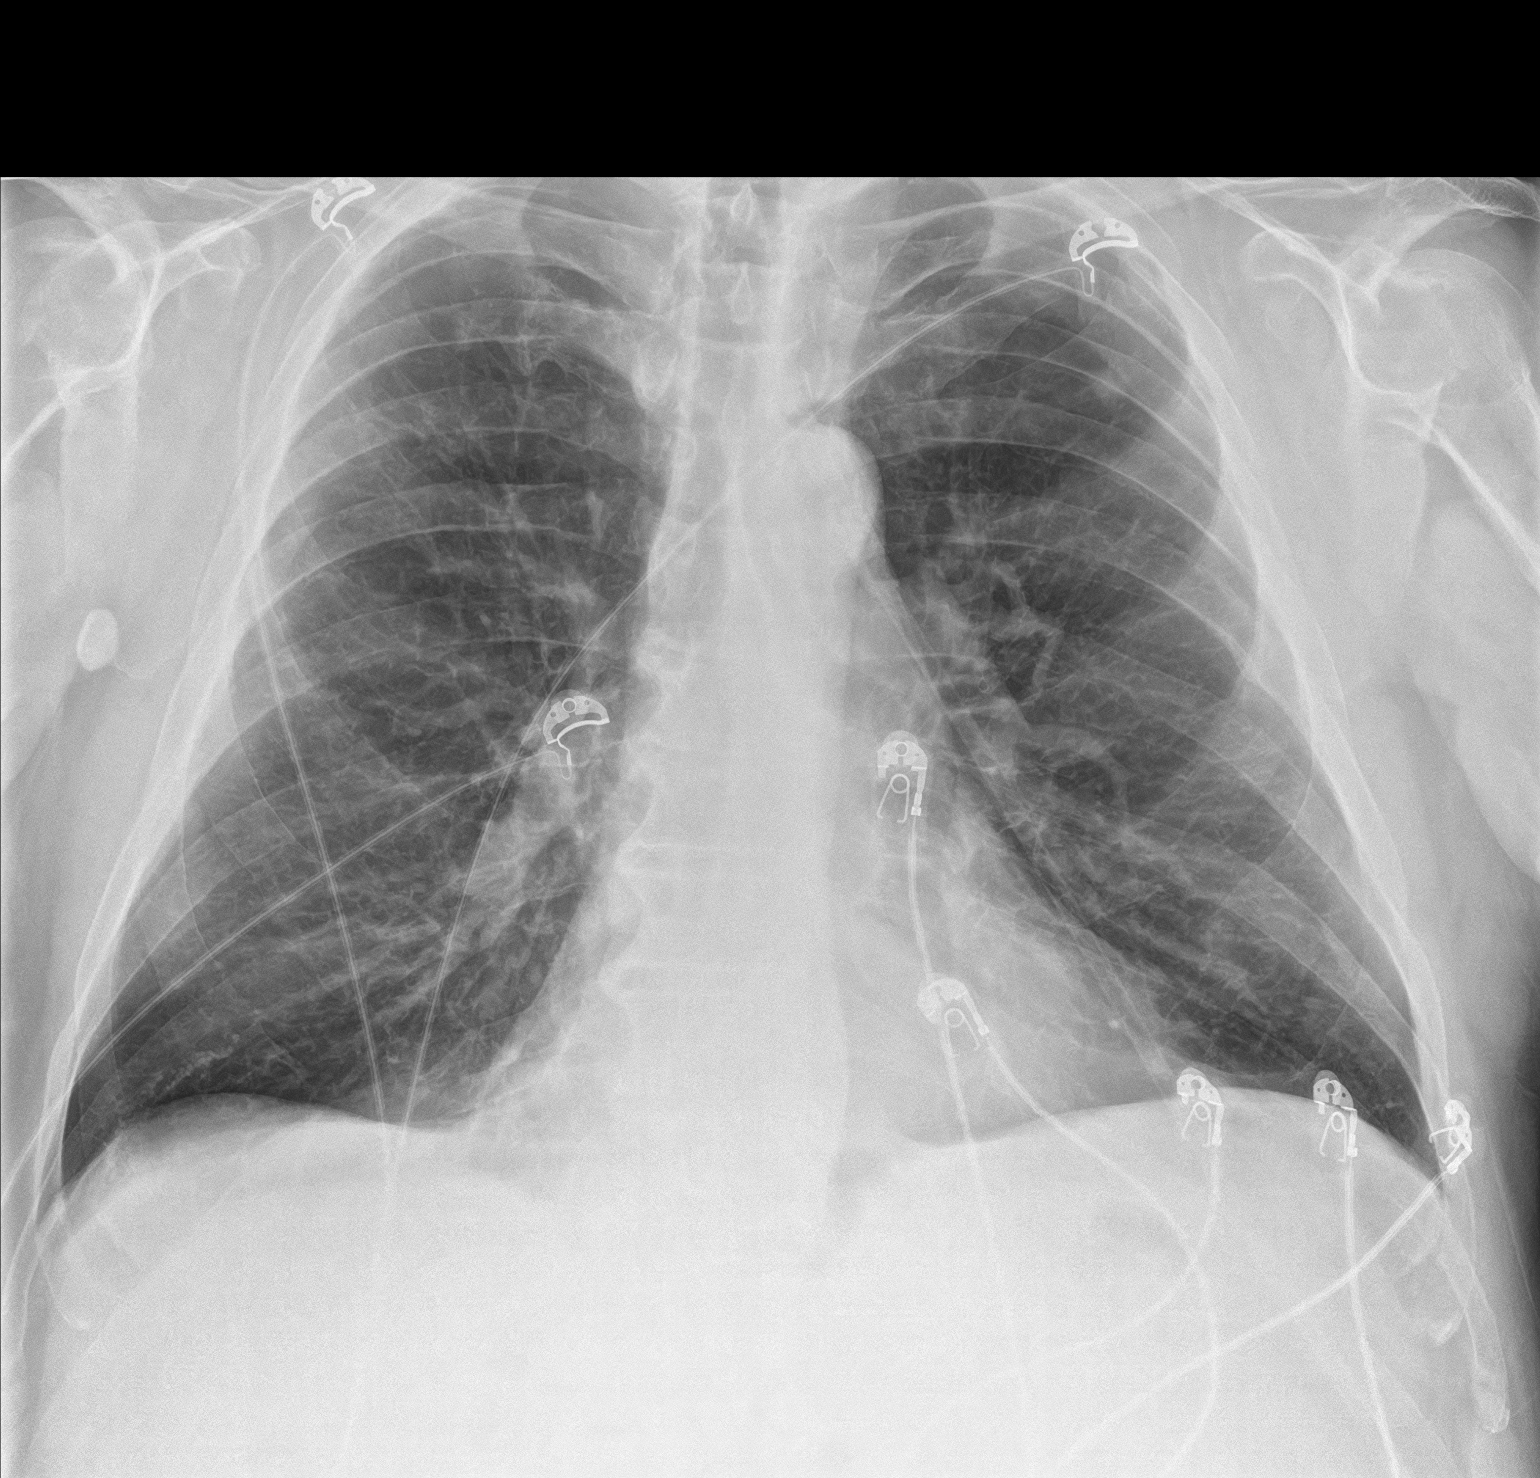
[im 2/2]
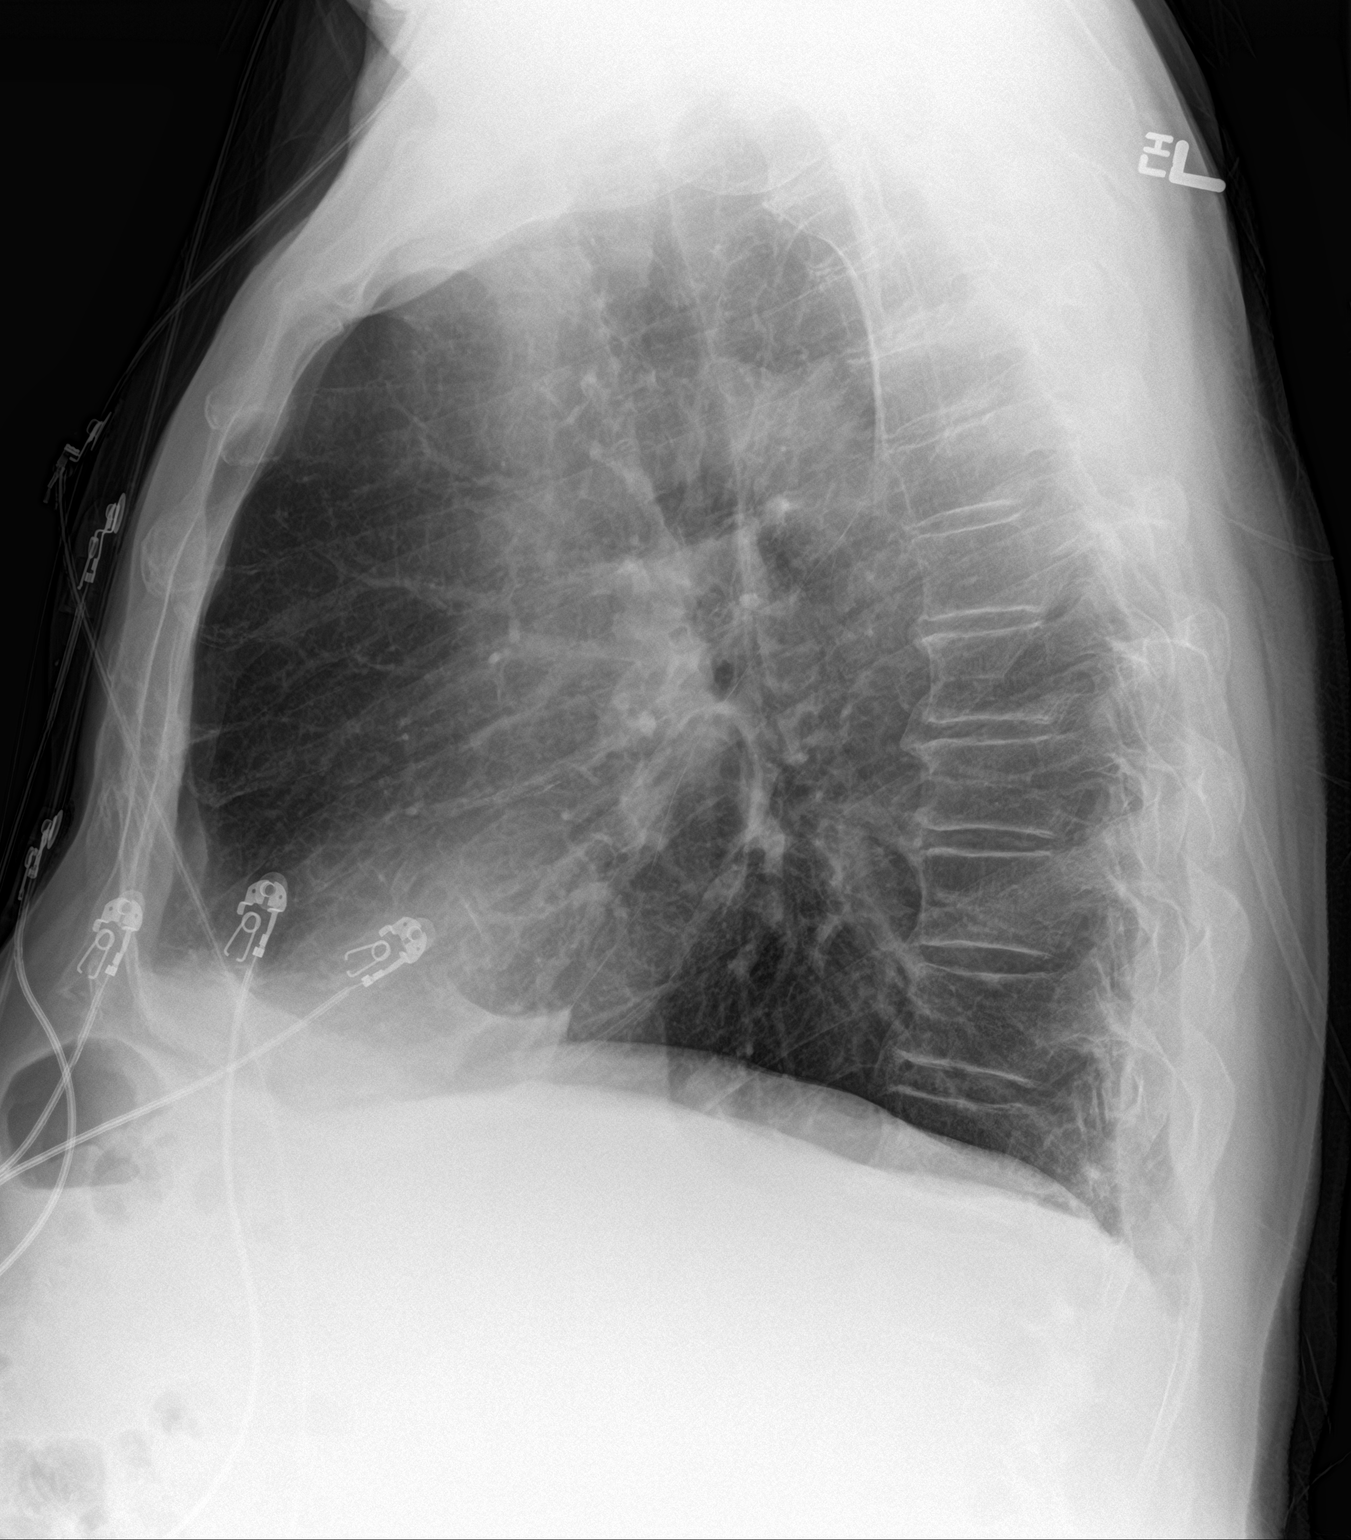

[2 of 2 positions shown; findings below may reference images not displayed]

FINDINGS: Hyperinflation. Numerous leads and wires project over the chest.
Midline trachea. Normal heart size. Atherosclerosis in the
transverse aorta. No pleural effusion or pneumothorax. Mild lower
lung predominant interstitial thickening. Suspect a small right
upper lobe pulmonary nodule, present back to [DATE] and likely a
calcified granuloma.
IMPRESSION: Hyperinflation and interstitial thickening, most consistent with
COPD/chronic bronchitis. No acute superimposed process.

Aortic Atherosclerosis ([7G]-[7G]).

## 2022-02-28 MED ORDER — IPRATROPIUM-ALBUTEROL 0.5-2.5 (3) MG/3ML IN SOLN
3.0000 mL | Freq: Once | RESPIRATORY_TRACT | Status: AC
  Administered 2022-02-28: 3 mL via RESPIRATORY_TRACT
  Filled 2022-02-28: qty 3

## 2022-02-28 MED ORDER — IPRATROPIUM-ALBUTEROL 0.5-2.5 (3) MG/3ML IN SOLN
3.0000 mL | RESPIRATORY_TRACT | Status: DC | PRN
  Administered 2022-02-28: 3 mL via RESPIRATORY_TRACT
  Filled 2022-02-28: qty 3

## 2022-02-28 MED ORDER — MAGNESIUM SULFATE 2 GM/50ML IV SOLN
2.0000 g | Freq: Once | INTRAVENOUS | Status: AC
  Administered 2022-02-28: 2 g via INTRAVENOUS
  Filled 2022-02-28: qty 50

## 2022-02-28 MED ORDER — METHYLPREDNISOLONE SODIUM SUCC 125 MG IJ SOLR
125.0000 mg | Freq: Once | INTRAMUSCULAR | Status: AC
Start: 2022-02-28 — End: 2022-02-28
  Administered 2022-02-28: 125 mg via INTRAVENOUS
  Filled 2022-02-28: qty 2

## 2022-02-28 NOTE — ED Notes (Signed)
Pt trying to find a ride  ? ?

## 2022-02-28 NOTE — ED Provider Notes (Signed)
? ? ?[facility] ?Emergency Department Provider Note ? ? ? ? Event Date/Time  ? First MD Initiated Contact with Patient 02/28/22 1410   ?  (approximate) ? ? ?History  ? ?Shortness of Breath ? ? ?HPI ? ?Leslie Sims is a 71 y.o. male with a history of CAD, CHF, COPD, hypertension, presents to the ED from home via EMS.  Patient reports he has been experiencing chest discomfort and shortness of breath for the last week.  He was evaluated by his PCP last week, and started on prednisone.  Patient reports shortness of breath that seem to worsen today.  He is on 2.5 L of O2 at baseline and arrives on 3 L from EMS with expiratory wheezing reported. ?  ? ? ?Physical Exam  ? ?Triage Vital Signs: ?ED Triage Vitals  ?Enc Vitals Group  ?   BP 02/28/22 1412 126/76  ?   Pulse Rate 02/28/22 1412 95  ?   Resp 02/28/22 1412 18  ?   Temp 02/28/22 1412 99 ?F (37.2 ?C)  ?   Temp Source 02/28/22 1412 Oral  ?   SpO2 02/28/22 1412 96 %  ?   Weight 02/28/22 1409 200 lb (90.7 kg)  ?   Height 02/28/22 1409 '5\' 10"'$  (1.778 m)  ?   Head Circumference --   ?   Peak Flow --   ?   Pain Score 02/28/22 1409 0  ?   Pain Loc --   ?   Pain Edu? --   ?   Excl. in [place]? --   ? ? ?Most recent vital signs: ?Vitals:  ? 02/28/22 1845 02/28/22 1900  ?BP: 122/78 (!) 134/104  ?Pulse: 88 87  ?Resp: 13 15  ?Temp:  98.1 ?F (36.7 ?C)  ?SpO2: 97% 95%  ? ? ?General Awake, no distress.  ?HEENT NCAT. PERRL. EOMI. No rhinorrhea. Mucous membranes are moist.  ?CV:  Good peripheral perfusion.  ?RESP:  Normal effort. Mild expiratory wheezes noted bilaterally ?ABD:  No distention.  ? ? ? ?ED Results / Procedures / Treatments  ? ?Labs ?(all labs ordered are listed, but only abnormal results are displayed) ?Labs Reviewed  ?BASIC METABOLIC PANEL - Abnormal; Notable for the following components:  ?    Result Value  ? Potassium 3.4 (*)   ? Glucose, Bld 179 (*)   ? All other components within normal limits  ?CBC WITH DIFFERENTIAL/PLATELET - Abnormal;  Notable for the following components:  ? Hemoglobin 12.9 (*)   ? Neutro Abs 8.8 (*)   ? Lymphs Abs 0.5 (*)   ? Abs Immature Granulocytes 0.09 (*)   ? All other components within normal limits  ?TROPONIN I (HIGH SENSITIVITY)  ?TROPONIN I (HIGH SENSITIVITY)  ? ? ? ?EKG ? ?Vent. rate 93 BPM ?PR interval 159 ms ?QRS duration 125 ms ?QT/QTcB 383/477 ms ?P-R-T axes 85 -55 107 ?No STEMI ? ?RADIOLOGY ? ?I personally viewed and evaluated these images as part of my medical decision making, as well as reviewing the written report by the radiologist. ? ?ED Provider Interpretation:  no acute consolidation} ? ?DG Chest 2 View ? ?Result Date: 02/28/2022 ?CLINICAL DATA:  Chest discomfort.  Shortness of breath and COPD EXAM: CHEST - 2 VIEW COMPARISON:  12/21/2021 FINDINGS: Hyperinflation. Numerous leads and wires project over the chest. Midline trachea. Normal heart size. Atherosclerosis in the transverse aorta. No pleural effusion or pneumothorax. Mild lower lung predominant interstitial thickening. Suspect a small right upper lobe  pulmonary nodule, present back to 05/25/2020 and likely a calcified granuloma. IMPRESSION: Hyperinflation and interstitial thickening, most consistent with COPD/chronic bronchitis. No acute superimposed process. Aortic Atherosclerosis (ICD10-I70.0). Electronically Signed   By: Abigail Miyamoto M.D.   On: 02/28/2022 15:08   ? ? ?PROCEDURES: ? ?Critical Care performed: No ? ?Procedures ? ? ?MEDICATIONS ORDERED IN ED: ?Medications  ?ipratropium-albuterol (DUONEB) 0.5-2.5 (3) MG/3ML nebulizer solution 3 mL (3 mLs Nebulization Given 02/28/22 1456)  ?methylPREDNISolone sodium succinate (SOLU-MEDROL) 125 mg/2 mL injection 125 mg (125 mg Intravenous Given 02/28/22 1521)  ?ipratropium-albuterol (DUONEB) 0.5-2.5 (3) MG/3ML nebulizer solution 3 mL (3 mLs Nebulization Given 02/28/22 1520)  ?magnesium sulfate IVPB 2 g 50 mL (0 g Intravenous Stopped 02/28/22 1628)  ? ? ? ?IMPRESSION / MDM / ASSESSMENT AND PLAN / ED COURSE  ?I  reviewed the triage vital signs and the nursing notes. ?             ?               ? ?Differential diagnosis includes, but is not limited to, viral syndrome, bronchitis including COPD exacerbation, pneumonia, reactive airway disease including asthma, CHF including exacerbation with or without pulmonary/interstitial edema, pneumothorax, ACS, thoracic trauma, and pulmonary embolism. ? ? ?The patient is on the cardiac monitor to evaluate for evidence of arrhythmia and/or significant heart rate changes. ? ?Patient with history of COPD, presents to the ED for persistent shortness of breath.  Patient's diagnosis is consistent with COPD exacerbation after negative chest x-ray and reassuring lab work-up.  Patient would like blood abnormalities, leukocytosis, or elevated troponin on evaluation.  Patient with no reported complaints at this time, endorses improvement of his symptoms overall after his ED course.  Patient is stable for discharge home with no indication for admission at this time.  Patient will be discharged home with instructions to continue with the previously prescribed steroid, and continue home medications. Patient is to follow up with his primary provider as needed or otherwise directed. Patient is given ED precautions to return to the ED for any worsening or new symptoms. ? ?Clinical Course as of 02/28/22 1953  ?Sun Feb 28, 2022  ?1725 BMI (Calculated): 28.7 [JM]  ?  ?Clinical Course User Index ?[JM] Kaylla Cobos, Dannielle Karvonen, PA-C  ? ? ? ?FINAL CLINICAL IMPRESSION(S) / ED DIAGNOSES  ? ?Final diagnoses:  ?COPD exacerbation ([place])  ? ? ? ?Rx / DC Orders  ? ?ED Discharge Orders   ? ? None  ? ?  ? ? ? ?Note:  This document was prepared using Dragon voice recognition software and may include unintentional dictation errors. ? ?  ?Melvenia Needles, PA-C ?02/28/22 1953 ? ?  ?Lavonia Drafts, MD ?03/01/22 [phone] ? ?

## 2022-02-28 NOTE — ED Notes (Signed)
Pt has personal oxygen for ride home. Pt gave verbal consent to DC ? ?

## 2022-02-28 NOTE — ED Triage Notes (Signed)
Pt arrived via [facility]. Pt Is alert and oriented x 4. Pt has been experiencing chest discomfort and shob for 1 week. Pt saw his PCP and received prednisone (has 3 doses left). Pt shob worsened today. Pt has COPD and is on 2.5L baseline. Pt arrived on 3L with exp wheezing.  ?

## 2022-02-28 NOTE — Discharge Instructions (Signed)
Take your home medicines as directed.  Complete the steroids previously prescribed.  Return to the ED if necessary. ?

## 2022-03-02 ENCOUNTER — Telehealth: Payer: Self-pay | Admitting: Pulmonary Disease

## 2022-03-02 MED ORDER — METHYLPREDNISOLONE 4 MG PO TBPK: ORAL_TABLET | ORAL | 0 refills | Status: DC

## 2022-03-02 MED ORDER — DOXYCYCLINE HYCLATE 100 MG PO TABS: 100.0000 mg | ORAL_TABLET | Freq: Two times a day (BID) | ORAL | 0 refills | Status: DC

## 2022-03-02 NOTE — Telephone Encounter (Signed)
Lets do a Medrol Dosepak and doxycycline 100 mg p.o. twice daily x10 days.  If he is not any better within 24 to 48 hours I recommend that he come back to the ED as we may be dealing with other issues other than the lungs. ?

## 2022-03-02 NOTE — Telephone Encounter (Signed)
Spoke to patient.  ?He stated that he had a recent ED visit 02/28/2022. given neb treatment at ED.  ?PCP prescribed prednisone prior to visit. He completed prednisone yesterday without relief in sx.  ?C/o increased sob with exertion, dry cough, wheezing and left side chest discomfort x1w. ?Denied f/c/s or additional sx. ?No recent covid or flu test.  ?Fully vaccinated against covid and flu.  ?Duoneb solution QID, pulmicort BID and albuterol HFA BID. ? ? ?Dr. Patsey Berthold, please advise. Thanks ?

## 2022-03-02 NOTE — Telephone Encounter (Signed)
Patient is aware of recommendations and voiced his understanding.  ?Doxycycline and medrol dosepak has been sent to preferred pharmacy. ?Nothing further needed.  ? ?

## 2022-03-05 ENCOUNTER — Telehealth: Payer: Self-pay | Admitting: Pulmonary Disease

## 2022-03-05 NOTE — Telephone Encounter (Signed)
I agree with this recommendation.  He needs to complete until until finished. ?

## 2022-03-05 NOTE — Telephone Encounter (Signed)
Patient called and asked if he should continue medrol Dosepak and doxycycline. Expressed to him that he should continue until it is complete. Patient will call if not better after completion.  ?

## 2022-03-15 ENCOUNTER — Encounter: Payer: Self-pay | Admitting: Emergency Medicine

## 2022-03-15 ENCOUNTER — Other Ambulatory Visit: Payer: Self-pay

## 2022-03-15 ENCOUNTER — Ambulatory Visit
Admission: EM | Admit: 2022-03-15 | Discharge: 2022-03-15 | Disposition: A | Discharge status: Home / Self Care | Payer: Medicare Other | Attending: Emergency Medicine | Admitting: Emergency Medicine

## 2022-03-15 ENCOUNTER — Ambulatory Visit (INDEPENDENT_AMBULATORY_CARE_PROVIDER_SITE_OTHER): Payer: Medicare Other

## 2022-03-15 DIAGNOSIS — R0602 Shortness of breath: Secondary | ICD-10-CM | POA: Diagnosis not present

## 2022-03-15 DIAGNOSIS — J441 Chronic obstructive pulmonary disease with (acute) exacerbation: Secondary | ICD-10-CM

## 2022-03-15 IMAGING — CR DG CHEST 2V
3 series · 3 of 3 positions shown · non-contrast
Comparison: [DATE] and chest CT [DATE]

CLINICAL DATA: Shortness of breath.

EXAM:
CHEST - 2 VIEW

[chest pa]
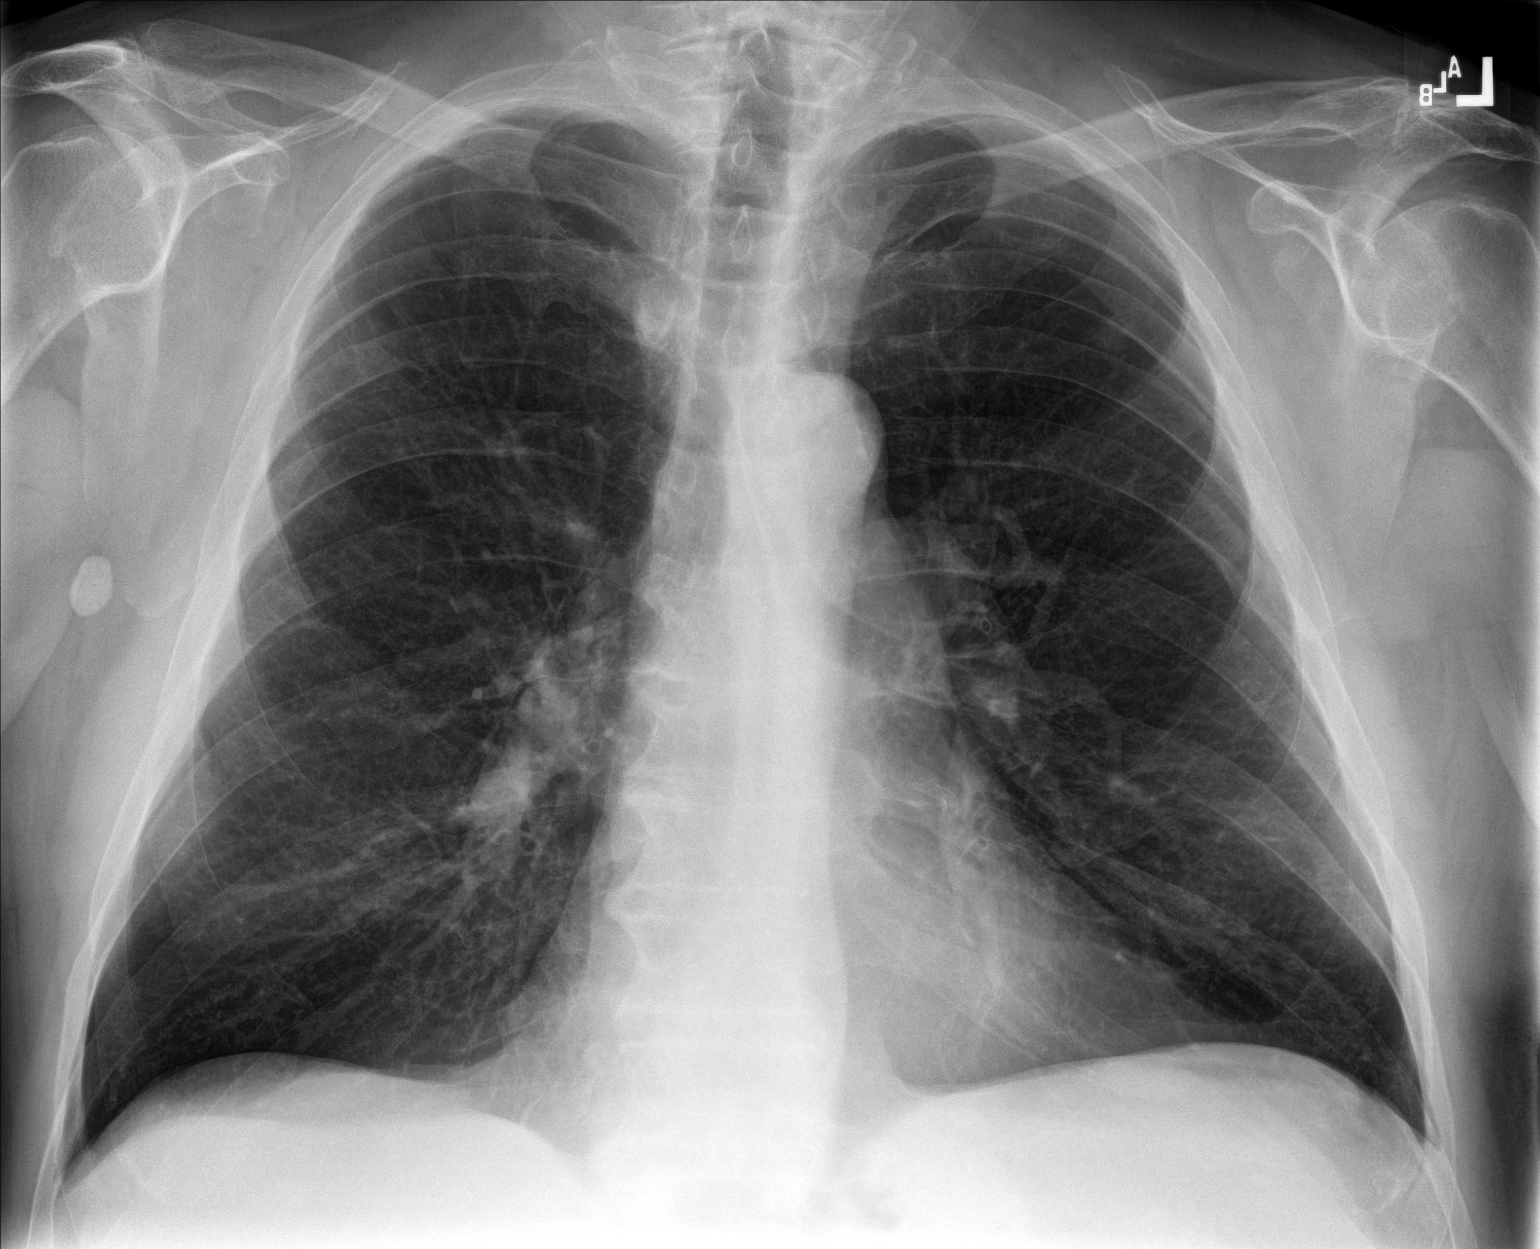

[chest lat (1 of 2)]
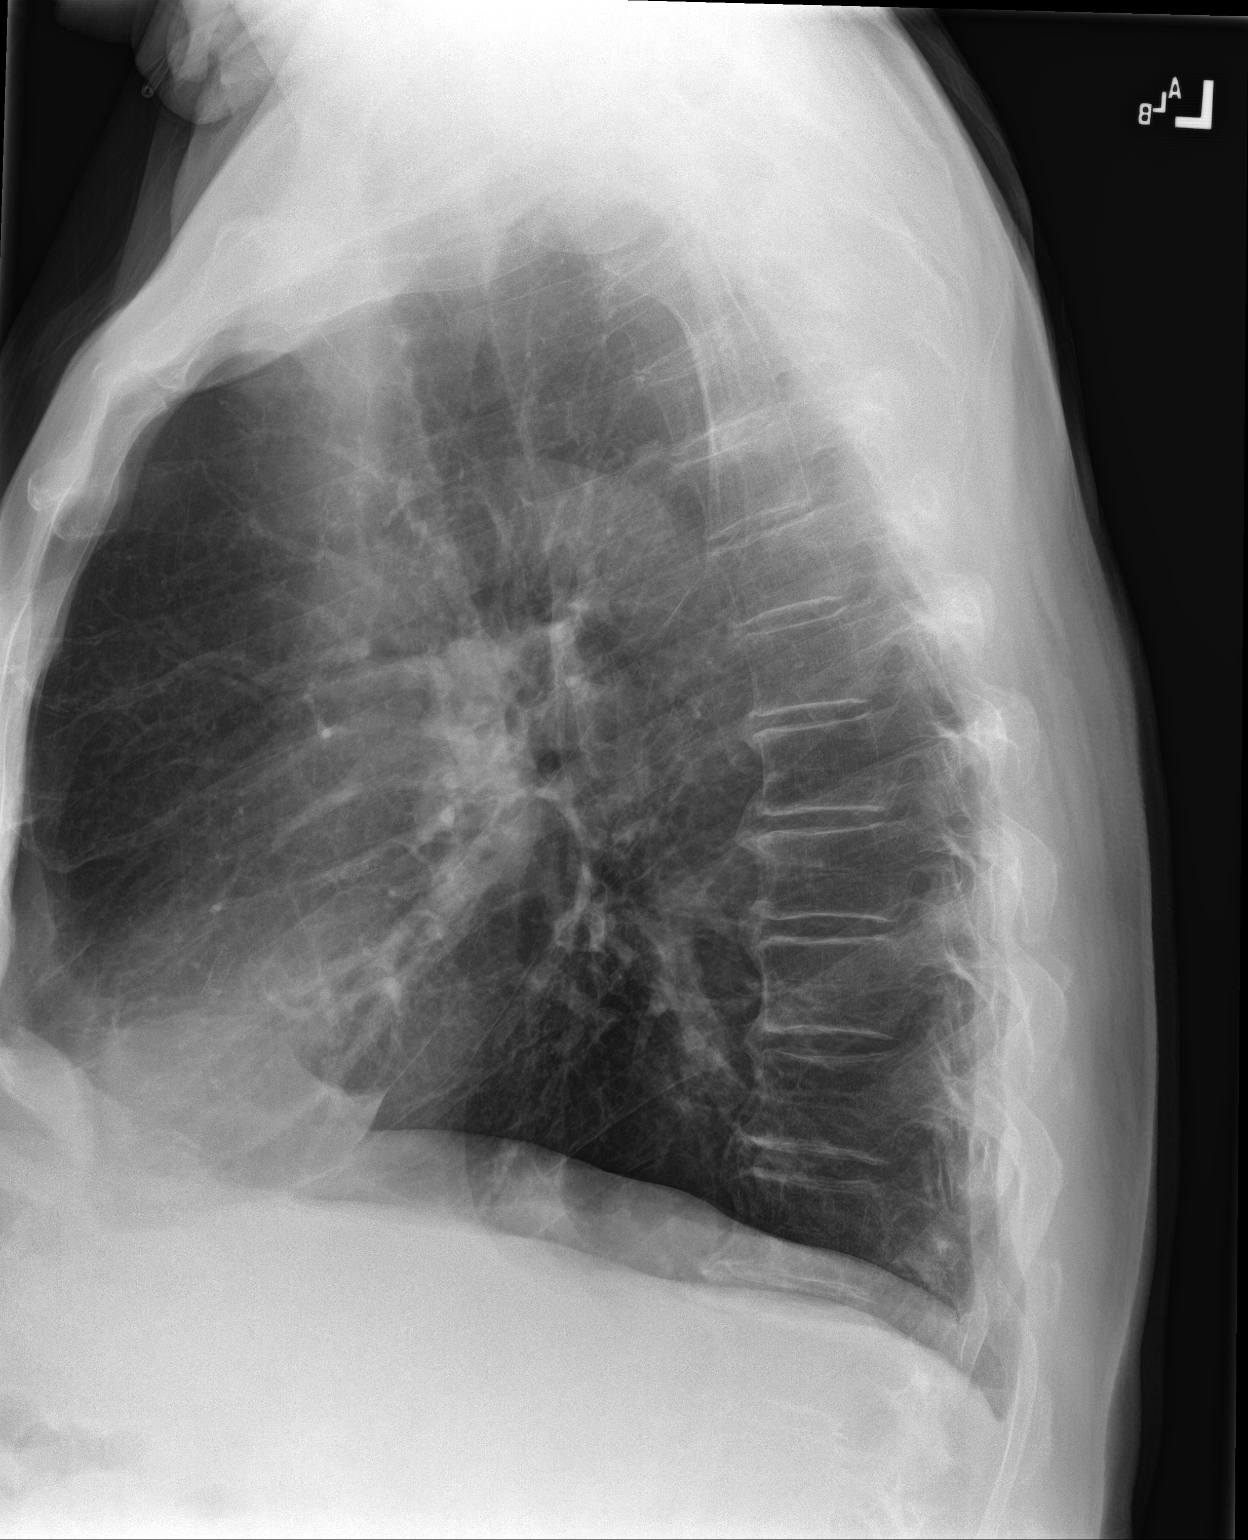

[chest lat (2 of 2)]
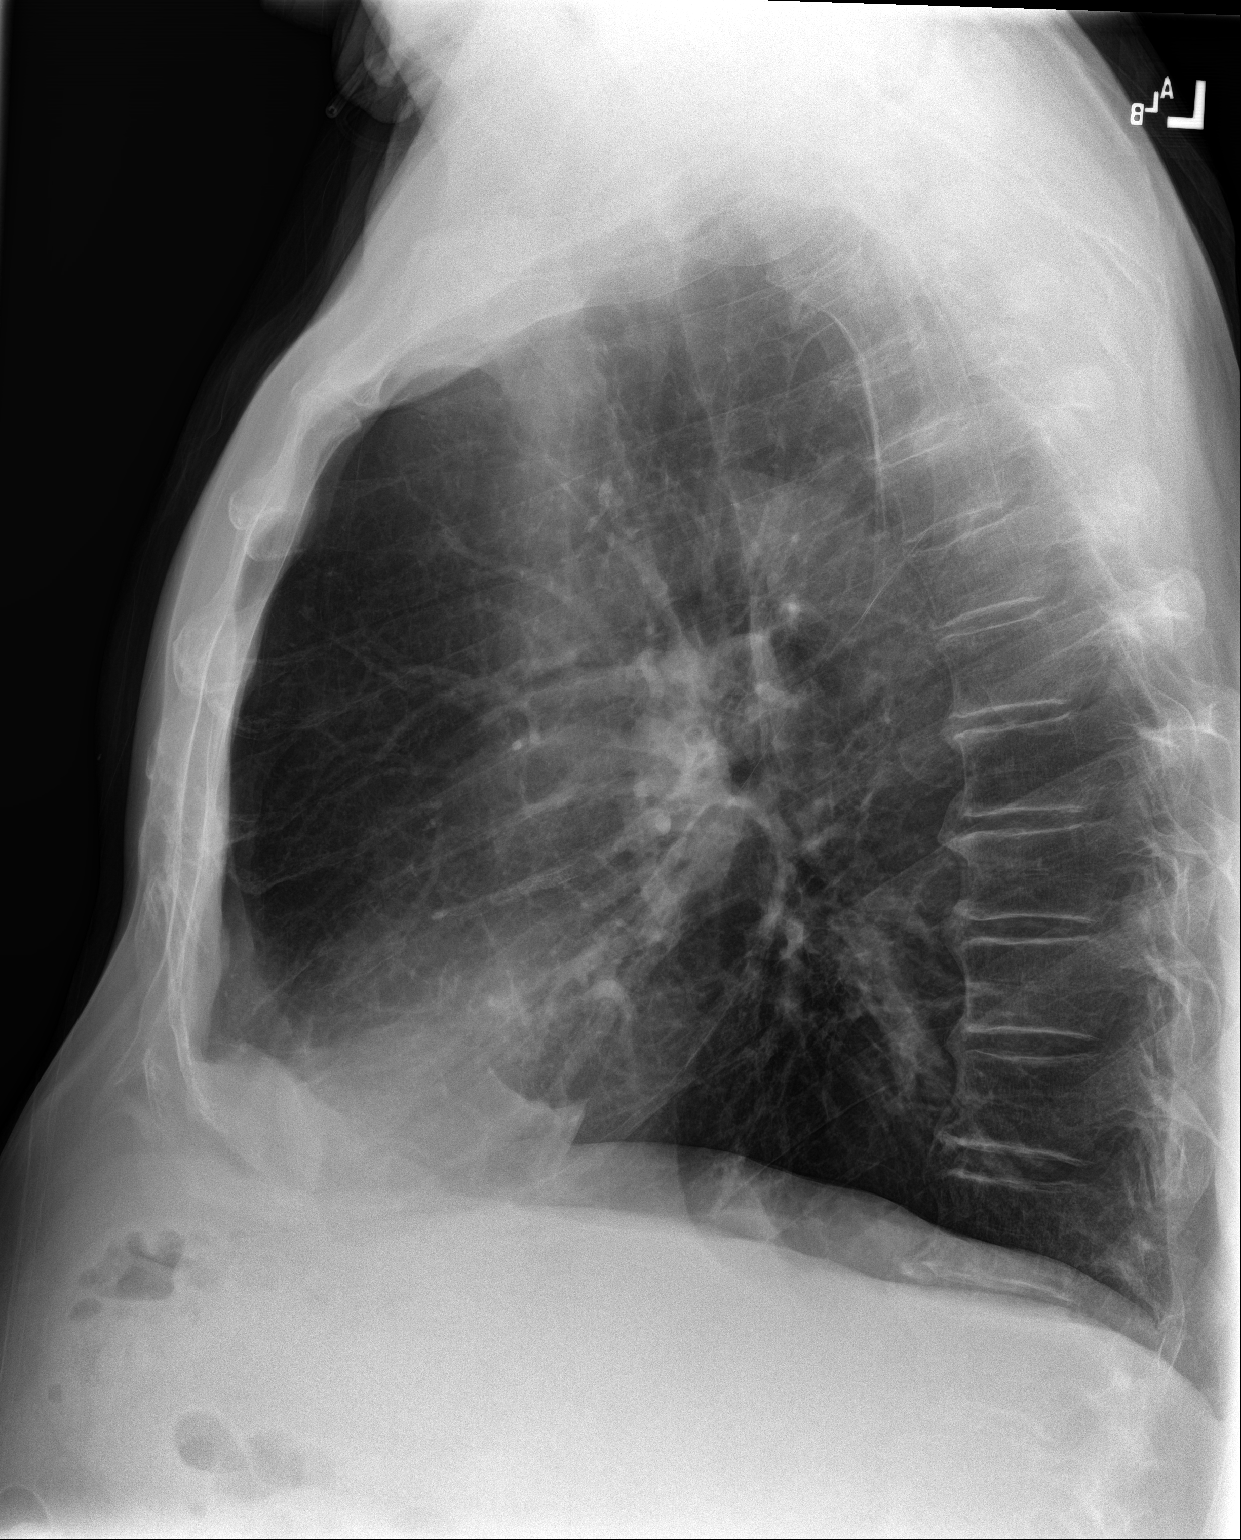

[3 of 3 positions shown; findings below may reference images not displayed]

FINDINGS: Hyperinflation and emphysematous disease. Both lungs are clear. No
pleural effusions. Heart and mediastinum are within normal limits.
Trachea is midline. Again noted is a large oval-shaped calcification
in the right axilla. Again noted is a vague nodular structure in the
left upper lung and patient has a known calcified granuloma in this
area. No acute bone abnormality.
IMPRESSION: 1. No acute cardiopulmonary disease.
2. Emphysema.

## 2022-03-15 MED ORDER — AMOXICILLIN-POT CLAVULANATE 875-125 MG PO TABS: 1.0000 | ORAL_TABLET | Freq: Two times a day (BID) | ORAL | 0 refills | Status: AC

## 2022-03-15 MED ORDER — PREDNISONE 10 MG (21) PO TBPK
ORAL_TABLET | Freq: Every day | ORAL | 0 refills | Status: DC
Start: 2022-03-15 — End: 2022-04-29

## 2022-03-15 MED ORDER — ALBUTEROL SULFATE (2.5 MG/3ML) 0.083% IN NEBU
2.5000 mg | INHALATION_SOLUTION | Freq: Once | RESPIRATORY_TRACT | Status: AC
  Administered 2022-03-15: 2.5 mg via RESPIRATORY_TRACT

## 2022-03-15 MED ORDER — METHYLPREDNISOLONE SODIUM SUCC 40 MG IJ SOLR
60.0000 mg | Freq: Once | INTRAMUSCULAR | Status: AC
  Administered 2022-03-15: 60 mg via INTRAMUSCULAR

## 2022-03-15 MED ORDER — AZITHROMYCIN 250 MG PO TABS: 250.0000 mg | ORAL_TABLET | Freq: Every day | ORAL | 0 refills | Status: DC

## 2022-03-15 NOTE — Discharge Instructions (Signed)
Take Augmentin twice daily for 10 days ? ?Take azithromycin as directed on packaging ? ?Begin use of prednisone tomorrow morning, taking medication every morning with food as directed ? ?Continue use of your nebulizer and inhaler medications ? ?Please schedule an appointment with your pulmonologist for reevaluation of your symptoms in 10 days ? ?Please follow-up with urgent care as needed if unable to be seen by your primary doctors ?

## 2022-03-15 NOTE — ED Provider Notes (Signed)
?[place] ? ? ? ?CSN: [id number] ?Arrival date & time: 03/15/22  1500 ? ? ?  ? ?History   ?Chief Complaint ?Chief Complaint  ?Patient presents with  ? Shortness of Breath  ? ? ?HPI ?Leslie Sims is a 71 y.o. male.  ? ?Patient presents with shortness of breath and centralized abdominal pressure for 1-1/2 weeks.  Associated wheezing.  Endorses he is using his daily inhalers which have been minimally effective.  Was seen in the emergency department 5 days ago for evaluation of abdominal pain, endorses he was given albuterol nebulizer treatment which was somewhat helpful with the shortness of breath but symptom was not further addressed.  Recently treated for a COPD exacerbation at the beginning of the months, symptoms never resolved from this initial episode.  Currently wearing 2-1/2 to 3 L of oxygen chronically.  Denies chest pain or tightness, coughing, fever, chills, URI symptoms.   ? ?Past Medical History:  ?Diagnosis Date  ? Allergic rhinitis   ? Anxiety   ? Back pain   ? Cancer [facility])   ? prostate  ? Colon polyps 08/2012  ? colonoscopy; multiple colon polyps; repeat colonoscopy in one year.  ? COPD (chronic obstructive pulmonary disease) ([place])   ? a. on home O2.  ? Coronary artery disease 11/2009  ? a. late presenting ant MI; b. LHC 100% pLAD s/p PCI/DES, 99% mRCA s/p PCI/DES, EF 35%; c. nuclear stress test 05/13: prior ant/inf infarcts w/o ischemia, EF 43%; d. LHC 02/14: widely patent stents with no other obs dz, EF 40%; e. LHC 11/17: LM nl, mLAD 10%, patent LAD stent, dLAD 20%, p-mRCA 10%, mRCA 40%, patent RCA stent, EF 35-45%   ? Depression   ? GERD (gastroesophageal reflux disease)   ? Headache(784.0)   ? Helicobacter pylori (H. pylori)   ? Hemorrhoids   ? Hernia   ? inguinal  ? HFrEF (heart failure with reduced ejection fraction) ([place])   ? a. 10/2016 LV gram: EF 35-45%; b. 06/2018 Echo: EF 40-45%. c. 01/2021 Echo: EF 40-45%  ? Hyperlipidemia   ? Hypertension   ? Insomnia   ? Ischemic  cardiomyopathy   ? a. 10/2016 LV gram: EF 35-45%; b. 06/2018 Echo: EF 40-45%.  ? Myalgia   ? Peptic ulcer   ? Pulmonary nodules   ? ST elevation (STEMI) myocardial infarction involving left anterior descending coronary artery ([place]) 11/2009  ? a. s/p PCI to the LAD  ? Status post dilation of esophageal narrowing 2000  ? Tinea pedis   ? Wears dentures   ? partial upper  ? ? ?Patient Active Problem List  ? Diagnosis Date Noted  ? COPD exacerbation ([place]) 07/31/2021  ? Prostate cancer ([place]) 10/11/2020  ? Elevated PSA 07/28/2020  ? Iron deficiency anemia 05/20/2020  ? Goals of care, counseling/discussion   ? Palliative care by specialist   ? Healthcare-associated pneumonia 01/28/2019  ? COPD with acute exacerbation ([place]) 12/31/2018  ? Generalized anxiety disorder 11/12/2018  ? Moderate episode of recurrent major depressive disorder ([place]) 11/12/2018  ? Acute respiratory failure with hypoxia ([place]) 09/29/2018  ? Acute respiratory failure with hypoxia and hypercapnia ([place]) 09/13/2018  ? Acute on chronic respiratory failure with hypoxia and hypercapnia ([place]) 08/24/2018  ? Chest pain 07/02/2018  ? Grief reaction 01/13/2018  ? COPD with hypoxia ([place]) 12/25/2017  ? Unstable angina ([facility])   ? Benign neoplasm of ascending colon   ? Benign neoplasm of descending colon   ? Benign neoplasm of  sigmoid colon   ? History of colonic polyps   ? Pulmonary nodules/lesions, multiple 01/05/2015  ? Bruit of left carotid artery 11/04/2014  ? Sleep disorder 07/18/2013  ? Seasonal and perennial allergic rhinitis 05/29/2013  ? Bradycardia 04/20/2011  ? SMOKER 07/30/2010  ? CAD, NATIVE VESSEL 04/23/2010  ? Hyperlipidemia 03/24/2010  ? DEPRESSION/ANXIETY 03/24/2010  ? Chronic systolic heart failure ([place]) 03/24/2010  ? COPD, very severe ([place]) 03/24/2010  ? GERD 03/24/2010  ? Essential hypertension 11/21/2009  ? ? ?Past Surgical History:  ?Procedure Laterality Date  ? Admission  12/20/2012  ? COPD exacerbation.  [place].  ? CARDIAC CATHETERIZATION    ?  CARDIAC CATHETERIZATION  02/05/13  ? [place]  ? CARDIAC CATHETERIZATION  10/14  ? [facility] : patent stents with no change in anatomy. EF: 40$  ? CARDIAC CATHETERIZATION Left 11/12/2016  ? Procedure: Left Heart Cath and Coronary Angiography;  Surgeon: Wellington Hampshire, MD;  Location: [place] [facility];  Service: Cardiovascular;  Laterality: Left;  ? COLONOSCOPY    ? COLONOSCOPY WITH PROPOFOL N/A 05/18/2016  ? Procedure: COLONOSCOPY WITH PROPOFOL;  Surgeon: Lucilla Lame, MD;  Location: [facility] ENDOSCOPY;  Service: Endoscopy;  Laterality: N/A;  ? COLONOSCOPY WITH PROPOFOL N/A 03/19/2020  ? Procedure: COLONOSCOPY WITH PROPOFOL;  Surgeon: Lin Landsman, MD;  Location: [facility] ENDOSCOPY;  Service: Gastroenterology;  Laterality: N/A;  ? CORONARY ANGIOPLASTY WITH STENT PLACEMENT  09/2009  ? LAD 3.0 X23 mm Xience DES, RCA: 4.0 X 15 mm Xience DES  ? ELECTROMAGNETIC NAVIGATION BROCHOSCOPY N/A 10/27/2017  ? Procedure: ELECTROMAGNETIC NAVIGATION BRONCHOSCOPY;  Surgeon: Flora Lipps, MD;  Location: [facility] ORS;  Service: Cardiopulmonary;  Laterality: N/A;  ? ESOPHAGEAL DILATION    ? ESOPHAGOGASTRODUODENOSCOPY  2008  ? ESOPHAGOGASTRODUODENOSCOPY  08/20/2012  ? HERNIA REPAIR  07/20/2012  ? L inguinal hernia repair  ? PENILE PROSTHESIS IMPLANT    ? ? ? ? ? ?Home Medications   ? ?Prior to Admission medications   ?Medication Sig Start Date End Date Taking? Authorizing Provider  ?albuterol (VENTOLIN HFA) 108 (90 Base) MCG/ACT inhaler Inhale 2 puffs into the lungs every 6 (six) hours as needed for wheezing or shortness of breath. 05/04/21  Yes Margarette Canada, NP  ?aspirin 81 MG tablet Take 81 mg by mouth daily.   Yes [provider]  ?budesonide (PULMICORT) 0.5 MG/2ML nebulizer solution Take 2 mLs (0.5 mg total) by nebulization in the morning and at bedtime. 02/17/22  Yes [name], MD  ?busPIRone (BUSPAR) 7.5 MG tablet Take by mouth as directed. 01/18/22  Yes [provider]  ?esomeprazole (NEXIUM) 40 MG capsule TAKE ONE CAPSULE  BY MOUTH ONCE DAILY 09/04/21  Yes [name], MD  ?fluticasone (FLONASE) 50 MCG/ACT nasal spray Place 2 sprays into both nostrils daily. 06/08/21  Yes [provider]  ?furosemide (LASIX) 40 MG tablet TAKE ONE TABLET BY MOUTH ONCE DAILY 01/25/22  Yes Theora Gianotti, NP  ?ipratropium-albuterol (DUONEB) 0.5-2.5 (3) MG/3ML SOLN USE 1 VIAL VIA NEBULIZER AND INHALE BY MOUTH 4 TIMES A DAY 02/20/21  Yes [name], MD  ?loratadine (CLARITIN) 10 MG tablet Take 10 mg by mouth daily. 06/08/21  Yes [provider]  ?losartan (COZAAR) 25 MG tablet Take 25 mg by mouth daily. 02/23/19  Yes [provider]  ?mirtazapine (REMERON) 15 MG tablet Take 15 mg by mouth at bedtime. 06/02/20  Yes [provider]  ?Multiple Vitamin (MULTIVITAMIN) capsule Take 1 capsule by mouth daily.   Yes [provider]  ?nitroGLYCERIN (NITROSTAT) 0.4 MG SL tablet Place 1 tablet (0.4 mg total) under the tongue every 5 (five) minutes as needed. 06/04/20  Yes Loel Dubonnet, NP  ?OLANZapine (ZYPREXA) 2.5 MG tablet Take 2.5 mg by mouth at bedtime.   Yes [provider]  ?OXYGEN Inhale 2.5 L into the lungs daily.   Yes [provider]  ?sucralfate (CARAFATE) 1 g tablet Take 1 g by mouth 4 (four) times daily. 06/03/20  Yes [provider]  ?atorvastatin (LIPITOR) 80 MG tablet Take 1 tablet (80 mg total) by mouth daily. 03/18/21 01/28/22  Rise Mu, PA-C  ?doxycycline (VIBRA-TABS) 100 MG tablet Take 1 tablet (100 mg total) by mouth 2 (two) times daily. 03/02/22   [name], MD  ?LORazepam (ATIVAN) 1 MG tablet Take 1 tablet (1 mg total) by mouth every 4 (four) hours while awake. 02/14/19   [name], MD  ?methylPREDNISolone (MEDROL DOSEPAK) 4 MG TBPK tablet Use as directed 03/02/22   [name], MD  ?Spacer/Aero-Holding Chambers (AEROCHAMBER MV) inhaler Use as instructed 05/04/21   Margarette Canada, NP  ? ? ?Family History ?Family History  ?Problem Relation  Age of Onset  ? Heart attack Brother   ?     Brother #1  ? Diabetes Brother   ? Hypertension Brother   ?     #3  ? Coronary artery disease Father 41  ?     deceased  ? Heart attack Father   ? Diabetes Father   ? Hea

## 2022-03-15 NOTE — ED Triage Notes (Signed)
Pt c/o shortness of breath, vomiting, upper abdominal pain. Started about a week and half ago. He states he is having a COPD exacerbation.  ?

## 2022-03-24 ENCOUNTER — Telehealth: Payer: Self-pay | Admitting: Pulmonary Disease

## 2022-03-24 DIAGNOSIS — J449 Chronic obstructive pulmonary disease, unspecified: Secondary | ICD-10-CM

## 2022-03-24 MED ORDER — IPRATROPIUM-ALBUTEROL 0.5-2.5 (3) MG/3ML IN SOLN: RESPIRATORY_TRACT | 11 refills | Status: DC

## 2022-03-24 NOTE — Telephone Encounter (Signed)
Spoke to [employer], Tourist information centre manager with Dr. Thompson Caul office. Cece stated that [facility] has du[facility]b on back order. She recommended that we sent Duoneb Rx to [facility]. ? ?Rx printed and placed in Dr. Domingo Dimes folder for signature.  ? ? ? ?

## 2022-04-02 NOTE — Telephone Encounter (Signed)
Called and spoke with patient, he states he was able to get his Duo nebs from [facility] (50 vials), [facility] so he could only get a partial fill.  Nothing further needed. ? ?He said he was not feeling well, he was not breathing well last night.  His sats have been 93-94%.  The duo nebs help for a little while.  He denies being congested or coughing.  He says he has a burning in his chest and has been throwing up his food.  He has an appointment to see a GI.  I advised him to call us if he feels like he needs to be seen sooner than his schedule f/u on 5/11.  I also advised him to call if his sob gets worse, his nebs do not help his sob, his sats drop below what is normal for him or he begins coughing up colored mucous.  He verbalized understanding.  Nothing further needed. ?

## 2022-04-14 ENCOUNTER — Other Ambulatory Visit: Payer: Self-pay | Admitting: Gastroenterology

## 2022-04-14 DIAGNOSIS — R933 Abnormal findings on diagnostic imaging of other parts of digestive tract: Secondary | ICD-10-CM

## 2022-04-16 ENCOUNTER — Encounter: Payer: Self-pay | Admitting: *Deleted

## 2022-04-19 ENCOUNTER — Encounter: Payer: Self-pay | Admitting: *Deleted

## 2022-04-19 ENCOUNTER — Ambulatory Visit: Payer: Medicare Other | Admitting: Certified Registered Nurse Anesthetist

## 2022-04-19 ENCOUNTER — Ambulatory Visit
Admission: RE | Admit: 2022-04-19 | Discharge: 2022-04-19 | Disposition: A | Discharge status: Home / Self Care | Payer: Medicare Other | Attending: Gastroenterology | Admitting: Gastroenterology

## 2022-04-19 ENCOUNTER — Encounter
Admission: RE | Disposition: A | Discharge status: Home / Self Care | Payer: Self-pay | Source: Home / Self Care | Attending: Gastroenterology

## 2022-04-19 DIAGNOSIS — Z955 Presence of coronary angioplasty implant and graft: Secondary | ICD-10-CM | POA: Diagnosis not present

## 2022-04-19 DIAGNOSIS — Z7951 Long term (current) use of inhaled steroids: Secondary | ICD-10-CM | POA: Insufficient documentation

## 2022-04-19 DIAGNOSIS — Z79899 Other long term (current) drug therapy: Secondary | ICD-10-CM | POA: Insufficient documentation

## 2022-04-19 DIAGNOSIS — J449 Chronic obstructive pulmonary disease, unspecified: Secondary | ICD-10-CM | POA: Insufficient documentation

## 2022-04-19 DIAGNOSIS — K219 Gastro-esophageal reflux disease without esophagitis: Secondary | ICD-10-CM | POA: Diagnosis not present

## 2022-04-19 DIAGNOSIS — I252 Old myocardial infarction: Secondary | ICD-10-CM | POA: Diagnosis not present

## 2022-04-19 DIAGNOSIS — I110 Hypertensive heart disease with heart failure: Secondary | ICD-10-CM | POA: Diagnosis not present

## 2022-04-19 DIAGNOSIS — E119 Type 2 diabetes mellitus without complications: Secondary | ICD-10-CM | POA: Insufficient documentation

## 2022-04-19 DIAGNOSIS — E785 Hyperlipidemia, unspecified: Secondary | ICD-10-CM | POA: Insufficient documentation

## 2022-04-19 DIAGNOSIS — F419 Anxiety disorder, unspecified: Secondary | ICD-10-CM | POA: Diagnosis not present

## 2022-04-19 DIAGNOSIS — Z9981 Dependence on supplemental oxygen: Secondary | ICD-10-CM | POA: Insufficient documentation

## 2022-04-19 DIAGNOSIS — I2510 Atherosclerotic heart disease of native coronary artery without angina pectoris: Secondary | ICD-10-CM | POA: Insufficient documentation

## 2022-04-19 DIAGNOSIS — F32A Depression, unspecified: Secondary | ICD-10-CM | POA: Insufficient documentation

## 2022-04-19 DIAGNOSIS — R1013 Epigastric pain: Secondary | ICD-10-CM | POA: Insufficient documentation

## 2022-04-19 DIAGNOSIS — K3189 Other diseases of stomach and duodenum: Secondary | ICD-10-CM | POA: Diagnosis not present

## 2022-04-19 DIAGNOSIS — Z87891 Personal history of nicotine dependence: Secondary | ICD-10-CM | POA: Diagnosis not present

## 2022-04-19 DIAGNOSIS — I5022 Chronic systolic (congestive) heart failure: Secondary | ICD-10-CM | POA: Insufficient documentation

## 2022-04-19 HISTORY — DX: Dysphagia, unspecified: R13.10

## 2022-04-19 HISTORY — DX: Dependence on supplemental oxygen: Z99.81

## 2022-04-19 HISTORY — PX: ESOPHAGOGASTRODUODENOSCOPY (EGD) WITH PROPOFOL: SHX5813

## 2022-04-19 SURGERY — ESOPHAGOGASTRODUODENOSCOPY (EGD) WITH PROPOFOL
Anesthesia: General

## 2022-04-19 MED ORDER — PROPOFOL 10 MG/ML IV BOLUS
INTRAVENOUS | Status: DC | PRN
  Administered 2022-04-19: 50 mg via INTRAVENOUS
  Administered 2022-04-19: 20 mg via INTRAVENOUS

## 2022-04-19 MED ORDER — PROPOFOL 500 MG/50ML IV EMUL
INTRAVENOUS | Status: DC | PRN
Start: 2022-04-19 — End: 2022-04-19
  Administered 2022-04-19: 130 ug/kg/min via INTRAVENOUS

## 2022-04-19 MED ORDER — SODIUM CHLORIDE 0.9 % IV SOLN: INTRAVENOUS | Status: DC

## 2022-04-19 MED ORDER — LIDOCAINE HCL (CARDIAC) PF 100 MG/5ML IV SOSY
PREFILLED_SYRINGE | INTRAVENOUS | Status: DC | PRN
  Administered 2022-04-19: 50 mg via INTRAVENOUS

## 2022-04-19 NOTE — H&P (Signed)
Outpatient short stay form Pre-procedure ?04/19/2022  ?[name], MD ? ?Primary Physician: [name], MD ? ?Reason for visit:  Dyspepsia ? ?History of present illness:   ? ?72 y/o gentleman with history of COPD on 2l at home here for EGD for dyspeptic symptoms and weight loss. States both solids and liquids come back up almost immediately after eating. Denies trouble swallowing and odynophagia. No blood thinners. No family history of GI malignancies. ? ? ? ?Current Facility-Administered Medications:  ?  0.9 %  sodium chloride infusion, , Intravenous, Continuous, Ira Dougher, Hilton Cork, MD, Last Rate: 20 mL/hr at 04/19/22 1039, New Bag at 04/19/22 1039 ? ?Medications Prior to Admission  ?Medication Sig Dispense Refill Last Dose  ? amitriptyline (ELAVIL) 25 MG tablet Take 25 mg by mouth at bedtime. 3 tabs at bedtime   Past Week  ? aspirin 81 MG tablet Take 81 mg by mouth daily.   Past Week  ? budesonide (PULMICORT) 0.5 MG/2ML nebulizer solution Take 2 mLs (0.5 mg total) by nebulization in the morning and at bedtime. 320 mL 12 04/18/2022  ? busPIRone (BUSPAR) 7.5 MG tablet Take by mouth as directed.   Past Week  ? esomeprazole (NEXIUM) 40 MG capsule TAKE ONE CAPSULE BY MOUTH ONCE DAILY 30 capsule 4 Past Week  ? furosemide (LASIX) 40 MG tablet TAKE ONE TABLET BY MOUTH ONCE DAILY 90 tablet 2 04/18/2022  ? ipratropium-albuterol (DUONEB) 0.5-2.5 (3) MG/3ML SOLN QID AND PRN 450 mL 11 04/19/2022  ? levalbuterol (XOPENEX) 1.25 MG/3ML nebulizer solution Take 1.25 mg by nebulization every 6 (six) hours as needed for wheezing.   Past Week  ? loratadine (CLARITIN) 10 MG tablet Take 10 mg by mouth daily.   Past Week  ? LORazepam (ATIVAN) 1 MG tablet Take 1 tablet (1 mg total) by mouth every 4 (four) hours while awake. 30 tablet 0 04/19/2022 at 0900  ? losartan (COZAAR) 25 MG tablet Take 25 mg by mouth daily.   Past Week  ? mirtazapine (REMERON) 15 MG tablet Take 15 mg by mouth at bedtime.   Past Week  ? Multiple Vitamin  (MULTIVITAMIN) capsule Take 1 capsule by mouth daily.   Past Week  ? OLANZapine (ZYPREXA) 2.5 MG tablet Take 2.5 mg by mouth at bedtime.   Past Week  ? sucralfate (CARAFATE) 1 g tablet Take 1 g by mouth 4 (four) times daily.   Past Week  ? albuterol (VENTOLIN HFA) 108 (90 Base) MCG/ACT inhaler Inhale 2 puffs into the lungs every 6 (six) hours as needed for wheezing or shortness of breath. 18 g 2   ? atorvastatin (LIPITOR) 80 MG tablet Take 1 tablet (80 mg total) by mouth daily. 90 tablet 3   ? azithromycin (ZITHROMAX) 250 MG tablet Take 1 tablet (250 mg total) by mouth daily. Take first 2 tablets together, then 1 every day until finished. (Patient not taking: Reported on 04/19/2022) 6 tablet 0 Completed Course  ? fluticasone (FLONASE) 50 MCG/ACT nasal spray Place 2 sprays into both nostrils daily.     ? nitroGLYCERIN (NITROSTAT) 0.4 MG SL tablet Place 1 tablet (0.4 mg total) under the tongue every 5 (five) minutes as needed. 25 tablet 3   ? OXYGEN Inhale 2.5 L into the lungs daily.     ? predniSONE (STERAPRED UNI-PAK 21 TAB) 10 MG (21) TBPK tablet Take by mouth daily. Take 6 tabs by mouth daily  for 2 days, then 5 tabs for 2 days, then 4 tabs for 2  days, then 3 tabs for 2 days, 2 tabs for 2 days, then 1 tab by mouth daily for 2 days (Patient not taking: Reported on 04/19/2022) 42 tablet 0 Completed Course  ? Spacer/Aero-Holding Chambers (AEROCHAMBER MV) inhaler Use as instructed 1 each 2   ? ? ? ?Allergies  ?Allergen Reactions  ? Prozac [Fluoxetine Hcl] Shortness Of Breath  ? Hydroxyzine   ? Effexor Xr [Venlafaxine Hcl Er] Other (See Comments)  ?  "Makes me feel funny"  ? Wellbutrin [Bupropion] Other (See Comments)  ?  "Makes me feel funny"  ? ? ? ?Past Medical History:  ?Diagnosis Date  ? Allergic rhinitis   ? Anxiety   ? Back pain   ? Cancer [facility])   ? prostate  ? CHF (congestive heart failure) ([place])   ? Colon polyps 08/2012  ? colonoscopy; multiple colon polyps; repeat colonoscopy in one year.  ? COPD (chronic  obstructive pulmonary disease) ([place])   ? a. on home O2.  ? Coronary artery disease 11/2009  ? a. late presenting ant MI; b. LHC 100% pLAD s/p PCI/DES, 99% mRCA s/p PCI/DES, EF 35%; c. nuclear stress test 05/13: prior ant/inf infarcts w/o ischemia, EF 43%; d. LHC 02/14: widely patent stents with no other obs dz, EF 40%; e. LHC 11/17: LM nl, mLAD 10%, patent LAD stent, dLAD 20%, p-mRCA 10%, mRCA 40%, patent RCA stent, EF 35-45%   ? Depression   ? Dysphagia   ? GERD (gastroesophageal reflux disease)   ? Headache(784.0)   ? Helicobacter pylori (H. pylori)   ? Hemorrhoids   ? Hernia   ? inguinal  ? HFrEF (heart failure with reduced ejection fraction) ([place])   ? a. 10/2016 LV gram: EF 35-45%; b. 06/2018 Echo: EF 40-45%. c. 01/2021 Echo: EF 40-45%  ? Hyperlipidemia   ? Hypertension   ? Insomnia   ? Ischemic cardiomyopathy   ? a. 10/2016 LV gram: EF 35-45%; b. 06/2018 Echo: EF 40-45%.  ? Myalgia   ? Oxygen dependent   ? Peptic ulcer   ? Pulmonary nodules   ? ST elevation (STEMI) myocardial infarction involving left anterior descending coronary artery ([place]) 11/2009  ? a. s/p PCI to the LAD  ? Status post dilation of esophageal narrowing 2000  ? Tinea pedis   ? Wears dentures   ? partial upper  ? ? ?Review of systems:  Otherwise negative.  ? ? ?Physical Exam ? ?Gen: Alert, oriented. Appears stated age.  ?HEENT: PERRLA. ?Lungs: No respiratory distress ?CV: RRR ?Abd: soft, benign, no masses ?Ext: No edema ? ? ? ?Planned procedures: Proceed with EGD. The patient understands the nature of the planned procedure, indications, risks, alternatives and potential complications including but not limited to bleeding, infection, perforation, damage to internal organs and possible oversedation/side effects from anesthesia. The patient agrees and gives consent to proceed.  ?Please refer to procedure notes for findings, recommendations and patient disposition/instructions.  ? ? ? ?[name], MD ?[facility] ? ? ? ?  ? ?

## 2022-04-19 NOTE — Op Note (Signed)
[facility] ?Gastroenterology ?Patient Name: Leslie Sims ?Procedure Date: 04/19/2022 11:09 AM ?MRN: [id number] ?Account #: [id number] ?Date of Birth: Aug 23, 1951 ?Admit Type: Outpatient ?Age: 71 ?Room: [facility] ENDO ROOM 3 ?Gender: Male ?Note Status: Finalized ?Instrument Name: Upper Endoscope [id number] ?Procedure:             Upper GI endoscopy ?Indications:           Dyspepsia ?Providers:             Andrey Farmer MD, MD ?Referring MD:          Renette Butters. Tamala Julian, MD (Referring MD) ?Medicines:             Monitored Anesthesia Care ?Complications:         No immediate complications. Estimated blood loss:  ?                       Minimal. ?Procedure:             Pre-Anesthesia Assessment: ?                       - Prior to the procedure, a History and Physical was  ?                       performed, and patient medications and allergies were  ?                       reviewed. The patient is competent. The risks and  ?                       benefits of the procedure and the sedation options and  ?                       risks were discussed with the patient. All questions  ?                       were answered and informed consent was obtained.  ?                       Patient identification and proposed procedure were  ?                       verified by the physician, the nurse, the  ?                       anesthesiologist, the anesthetist and the technician  ?                       in the endoscopy suite. Mental Status Examination:  ?                       alert and oriented. Airway Examination: normal  ?                       oropharyngeal airway and neck mobility. Respiratory  ?                       Examination: clear to auscultation. CV Examination:  ?  normal. Prophylactic Antibiotics: The patient does not  ?                       require prophylactic antibiotics. Prior  ?                       Anticoagulants: The patient has taken no previous  ?                        anticoagulant or antiplatelet agents. ASA Grade  ?                       Assessment: III - A patient with severe systemic  ?                       disease. After reviewing the risks and benefits, the  ?                       patient was deemed in satisfactory condition to  ?                       undergo the procedure. The anesthesia plan was to use  ?                       monitored anesthesia care (MAC). Immediately prior to  ?                       administration of medications, the patient was  ?                       re-assessed for adequacy to receive sedatives. The  ?                       heart rate, respiratory rate, oxygen saturations,  ?                       blood pressure, adequacy of pulmonary ventilation, and  ?                       response to care were monitored throughout the  ?                       procedure. The physical status of the patient was  ?                       re-assessed after the procedure. ?                       After obtaining informed consent, the endoscope was  ?                       passed under direct vision. Throughout the procedure,  ?                       the patient's blood pressure, pulse, and oxygen  ?                       saturations were monitored continuously. The Endoscope  ?  was introduced through the mouth, and advanced to the  ?                       second part of duodenum. The upper GI endoscopy was  ?                       accomplished without difficulty. The patient tolerated  ?                       the procedure well. ?Findings: ?     A medium-sized, ulcerating mass with no bleeding and no stigmata of  ?     recent bleeding was found at the gastroesophageal junction, 40 cm from  ?     the incisors. The mass was partially obstructing and circumferential.  ?     Biopsies were taken with a cold forceps for histology. Estimated blood  ?     loss was minimal. ?     The entire examined stomach was normal. ?     The examined duodenum was  normal. 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  ?                       Biopsied. ?                       - Normal stomach. ?                       - Normal examined duodenum. ?Recommendation:        - Discharge patient to home. ?                       - Soft diet. ?                       - Continue present medications. ?                       - Await pathology results. ?                       - Refer to an oncologist at appointment to be  ?                       scheduled. ?                       - Return to referring physician as previously  ?                       scheduled. ?Procedure Code(s):     --- Professional --- ?                       [phone], Esophagogastroduodenoscopy, flexible,  ?                       transoral; with biopsy, single or multiple ?Diagnosis Code(s):     ---  Professional --- ?                       D49.0, Neoplasm of unspecified behavior of digestive  ?                       system ?                       R10.13, Epigastric pain ?CPT copyright 2019 American Medical Association. All rights reserved. ?The codes documented in this report are preliminary and upon coder review may  ?be revised to meet current compliance requirements. ?Andrey Farmer MD, MD ?04/19/2022 11:41:15 AM ?Number of Addenda: 0 ?Note Initiated On: 04/19/2022 11:09 AM ?Estimated Blood Loss:  Estimated blood loss was minimal. ?     [facility] ?

## 2022-04-19 NOTE — Interval H&P Note (Signed)
History and Physical Interval Note: ? ?04/19/2022 ?11:21 AM ? ?Leslie Sims  has presented today for surgery, with the diagnosis of abnormal CT scan,weight loss, dyspepsia.  The various methods of treatment have been discussed with the patient and family. After consideration of risks, benefits and other options for treatment, the patient has consented to  Procedure(s): ?ESOPHAGOGASTRODUODENOSCOPY (EGD) WITH PROPOFOL (N/A) as a surgical intervention.  The patient's history has been reviewed, patient examined, no change in status, stable for surgery.  I have reviewed the patient's chart and labs.  Questions were answered to the patient's satisfaction.   ? ? ?Leslie Sims ? ?Ok to proceed with EGD ?

## 2022-04-19 NOTE — Anesthesia Preprocedure Evaluation (Addendum)
Anesthesia Evaluation  ?Patient identified by MRN, date of birth, ID band ?Patient awake ? ? ? ?Reviewed: ?Allergy & Precautions, NPO status , Patient's Chart, lab work & pertinent test results, reviewed documented beta blocker date and time  ? ?History of Anesthesia Complications ?Negative for: history of anesthetic complications ? ?Airway ?Mallampati: II ? ?TM Distance: >3 FB ?Neck ROM: Full ? ? ? Dental ?no notable dental hx. ?(+) Partial Upper, Dental Advidsory Given, Poor Dentition, Partial Lower ?  ?Pulmonary ?shortness of breath and with exertion, neg sleep apnea, COPD,  COPD inhaler and oxygen dependent, neg recent URI, Patient abstained from smoking.Not current smoker, former smoker,  ?  ? ?+ decreased breath sounds ? ? ? ? ? Cardiovascular ?Exercise Tolerance: Poor ?hypertension, Pt. on medications ?(-) angina+ CAD, + Past MI, + Cardiac Stents and +CHF  ?(-) CABG (-) dysrhythmias (-) Valvular Problems/Murmurs ?Rhythm:Regular Rate:Normal ?- Systolic murmurs ?TTE 2022 (unchanged from 2019): ?1. Left ventricular ejection fraction, by estimation, is 40 to 45%. The  ?left ventricle has mildly decreased function. The left ventricle  ?demonstrates global hypokinesis, severe hypokinesis of the periapical  ?region. Left ventricular diastolic parameters  ?are consistent with Grade II diastolic dysfunction (pseudonormalization).  ??2. Right ventricular systolic function is normal. The right ventricular  ?size is normal. Tricuspid regurgitation signal is inadequate for assessing  ?PA pressure.  ??3. Left atrial size was mildly dilated.  ? ?  ?Neuro/Psych ? Headaches, neg Seizures PSYCHIATRIC DISORDERS Anxiety Depression   ? GI/Hepatic ?PUD, GERD  ,  ?Endo/Other  ?diabetes (Pre-DM) ? Renal/GU ?negative Renal ROS  ? ?  ?Musculoskeletal ? ? Abdominal ?  ?Peds ? Hematology ?negative hematology ROS ?(+)   ?Anesthesia Other Findings ?Past Medical History: ?No date: Allergic rhinitis ?No  date: Anxiety ?No date: Back pain ?No date: Cancer [facility]) ?    Comment:  prostate ?No date: CHF (congestive heart failure) ([place]) ?08/2012: Colon polyps ?    Comment:  colonoscopy; multiple colon polyps; repeat colonoscopy  ?             in one year. ?No date: COPD (chronic obstructive pulmonary disease) ([place]) ?    Comment:  a. on home O2. ?11/2009: Coronary artery disease ?    Comment:  a. late presenting ant MI; b. LHC 100% pLAD s/p PCI/DES, ?             99% mRCA s/p PCI/DES, EF 35%; c. nuclear stress test  ?             05/13: prior ant/inf infarcts w/o ischemia, EF 43%; d.  ?             LHC 02/14: widely patent stents with no other obs dz, EF  ?             40%; e. LHC 11/17: LM nl, mLAD 10%, patent LAD stent,  ?             dLAD 20%, p-mRCA 10%, mRCA 40%, patent RCA stent, EF  ?             35-45%  ?No date: Depression ?No date: Dysphagia ?No date: GERD (gastroesophageal reflux disease) ?No date: Headache(784.0) ?No date: Helicobacter pylori (H. pylori) ?No date: Hemorrhoids ?No date: Hernia ?    Comment:  inguinal ?No date: HFrEF (heart failure with reduced ejection fraction) ([place]) ?    Comment:  a. 10/2016 LV gram: EF 35-45%; b. 06/2018 Echo: EF  ?  40-45%. c. 01/2021 Echo: EF 40-45% ?No date: Hyperlipidemia ?No date: Hypertension ?No date: Insomnia ?No date: Ischemic cardiomyopathy ?    Comment:  a. 10/2016 LV gram: EF 35-45%; b. 06/2018 Echo: EF  ?             40-45%. ?No date: Myalgia ?No date: Oxygen dependent ?No date: Peptic ulcer ?No date: Pulmonary nodules ?11/2009: ST elevation (STEMI) myocardial infarction involving left  ?anterior descending coronary artery ([place]) ?    Comment:  a. s/p PCI to the LAD ?2000: Status post dilation of esophageal narrowing ?No date: Tinea pedis ?No date: Wears dentures ?    Comment:  partial upper ? ? Reproductive/Obstetrics ? ?  ? ? ? ? ? ? ? ? ? ? ? ? ? ?  ?  ? ? ? ? ? ? ? ?Anesthesia Physical ? ?Anesthesia Plan ? ?ASA: 3 ? ?Anesthesia Plan: General  ? ?Post-op  Pain Management: Minimal or no pain anticipated  ? ?Induction: Intravenous ? ?PONV Risk Score and Plan: 2 and Ondansetron, TIVA and Propofol infusion ? ?Airway Management Planned: Natural Airway ? ?Additional Equipment: None ? ?Intra-op Plan:  ? ?Post-operative Plan: Extubation in OR ? ?Informed Consent: I have reviewed the patients History and Physical, chart, labs and discussed the procedure including the risks, benefits and alternatives for the proposed anesthesia with the patient or authorized representative who has indicated his/her understanding and acceptance.  ? ? ? ?Dental advisory given ? ?Plan Discussed with: CRNA ? ?Anesthesia Plan Comments: (Discussed risks of anesthesia with patient, including possibility of difficulty with spontaneous ventilation under anesthesia necessitating airway intervention, PONV, and rare risks such as cardiac or respiratory or neurological events, and allergic reactions. Discussed the role of CRNA in patient's perioperative care. Patient understands. ?Patient counseled on being higher risk for anesthesia due to comorbidities: o2-dependent COPD. Patient was told about increased risk of cardiac and respiratory events, including death. ?)  ? ? ? ? ? ?Anesthesia Quick Evaluation ? ?

## 2022-04-19 NOTE — Transfer of Care (Signed)
Immediate Anesthesia Transfer of Care Note ? ?Patient: Leslie Sims [facility] ? ?Procedure(s) Performed: ESOPHAGOGASTRODUODENOSCOPY (EGD) WITH PROPOFOL ? ?Patient Location: Endoscopy Unit ? ?Anesthesia Type:General ? ?Level of Consciousness: awake and alert  ? ?Airway & Oxygen Therapy: Patient Spontanous Breathing ? ?Post-op Assessment: Report given to RN and Post -op Vital signs reviewed and stable ? ?Post vital signs: Reviewed and stable ? ?Last Vitals:  ?Vitals Value Taken Time  ?BP    ?Temp    ?Pulse    ?Resp    ?SpO2    ? ? ?Last Pain:  ?Vitals:  ? 04/19/22 1139  ?TempSrc: Temporal  ?PainSc: Asleep  ?   ? ?  ? ?Complications: No notable events documented. ?

## 2022-04-19 NOTE — Anesthesia Postprocedure Evaluation (Signed)
Anesthesia Post Note ? ?Patient: Leslie Sims [facility] ? ?Procedure(s) Performed: ESOPHAGOGASTRODUODENOSCOPY (EGD) WITH PROPOFOL ? ?Patient location during evaluation: Endoscopy ?Anesthesia Type: General ?Level of consciousness: awake and alert ?Pain management: pain level controlled ?Vital Signs Assessment: post-procedure vital signs reviewed and stable ?Respiratory status: spontaneous breathing, nonlabored ventilation, respiratory function stable and patient connected to nasal cannula oxygen ?Cardiovascular status: blood pressure returned to baseline and stable ?Postop Assessment: no apparent nausea or vomiting ?Anesthetic complications: no ? ? ?No notable events documented. ? ? ?Last Vitals:  ?Vitals:  ? 04/19/22 1139 04/19/22 1159  ?BP: 123/76   ?Pulse:    ?Resp:    ?Temp: (!) 36.3 ?C   ?SpO2:  95%  ?  ?Last Pain:  ?Vitals:  ? 04/19/22 1159  ?TempSrc:   ?PainSc: 0-No pain  ? ? ?  ?  ?  ?  ?  ?  ? ?Arita Miss ? ? ? ? ?

## 2022-04-19 NOTE — Anesthesia Procedure Notes (Signed)
Procedure Name: [place] ?Date/Time: 04/19/2022 11:23 AM ?Performed by: Tollie Eth, CRNA ?Pre-anesthesia Checklist: Patient identified, Emergency Drugs available, Suction available and Patient being monitored ?Patient Re-evaluated:Patient Re-evaluated prior to induction ?Oxygen Delivery Method: Simple face mask ?Induction Type: IV induction ?Placement Confirmation: positive ETCO2 ? ? ? ? ?

## 2022-04-20 ENCOUNTER — Encounter: Payer: Self-pay | Admitting: Gastroenterology

## 2022-04-20 LAB — SURGICAL PATHOLOGY

## 2022-04-23 ENCOUNTER — Other Ambulatory Visit: Payer: Self-pay | Admitting: Pulmonary Disease

## 2022-04-23 NOTE — Telephone Encounter (Signed)
Patient is requesting refill on azithromycin. Originally prescribed by Lowella Petties, NP  ? ?Dr. Patsey Berthold, please advise. Thanks ?

## 2022-04-26 ENCOUNTER — Ambulatory Visit
Admission: RE | Admit: 2022-04-26 | Discharge: 2022-04-26 | Disposition: A | Discharge status: Home / Self Care | Payer: Medicare Other | Source: Ambulatory Visit | Attending: Gastroenterology | Admitting: Gastroenterology

## 2022-04-26 ENCOUNTER — Encounter: Payer: Self-pay | Admitting: *Deleted

## 2022-04-26 DIAGNOSIS — R933 Abnormal findings on diagnostic imaging of other parts of digestive tract: Secondary | ICD-10-CM | POA: Diagnosis present

## 2022-04-26 IMAGING — MR MR ABDOMEN WO/W CM MRCP
19 of 22 series · 44 of 48 positions shown · IV contrast (gadavist)
Comparison: CT abdomen and pelvis [DATE]

CLINICAL DATA: Abnormal findings on CT, adrenal nodules

EXAM:
MRI ABDOMEN WITHOUT AND WITH CONTRAST (INCLUDING MRCP)
TECHNIQUE: Multiplanar multisequence MR imaging of the abdomen was performed
both before and after the administration of intravenous contrast.
Heavily T2-weighted images of the biliary and pancreatic ducts were
obtained, and three-dimensional MRCP images were rendered by post
processing.
CONTRAST:  8mL GADAVIST GADOBUTROL 1 MMOL/ML IV SOLN

[Series 3: T2 · coronal · 6.0mm · 1.19mm/px · 1 of 40 slices shown (1 of 2)]
[im 1/40]
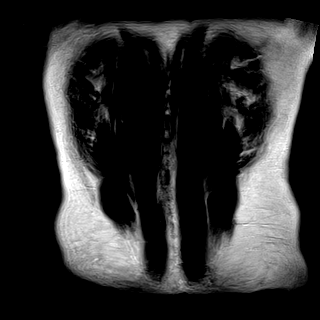

[Series 4: T2 · axial · 6.0mm · 1.19mm/px · 1 of 34 slices shown (2 of 2)]
[im 1/34]
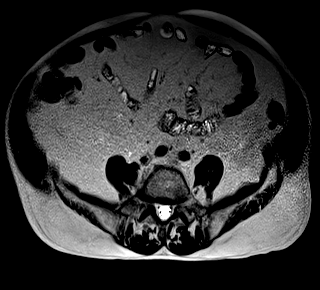

[Series 5: in & out · axial · 3.5mm · 1.19mm/px · z∈[-180,+68]mm · 2 of 72 slices shown (1 of 2)]
[im 1/72]
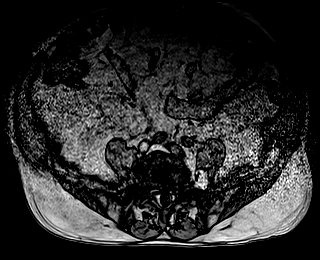
[im 72/72]
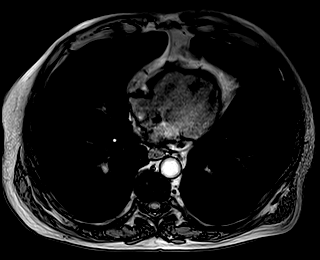

[Series 6: in & out · axial · 3.5mm · 1.19mm/px · z∈[-180,+68]mm · 2 of 72 slices shown (2 of 2)]
[im 1/72]
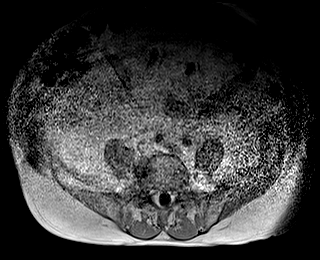
[im 72/72]
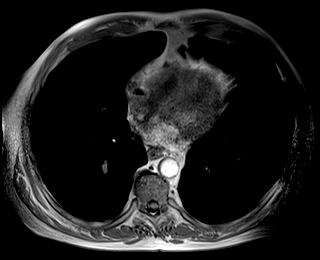

[Series 9: T2 fat-sat · axial · 6.0mm · 1.25mm/px · 1 of 35 slices shown]
[im 1/35]
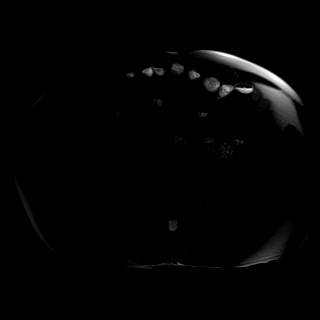

[Series 10: ax dwi_tracew · axial · 6.0mm · 1.42mm/px · z∈[-179,+73]mm · 4 of 108 slices shown]
[im 1/108]
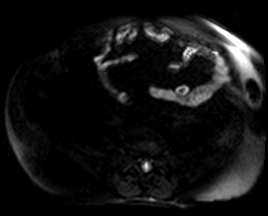
[im 36/108]
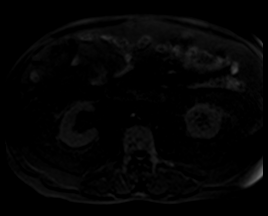
[im 72/108]
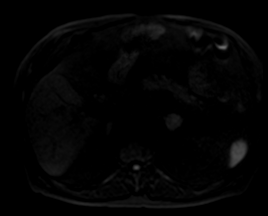
[im 108/108]
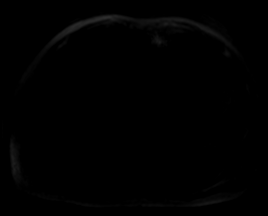

[Series 11: ax dwi_adc · axial · 6.0mm · 1.42mm/px · 1 of 36 slices shown]
[im 1/36]
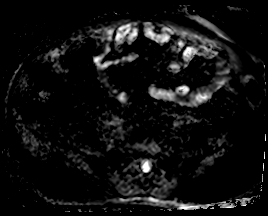

[Series 15: MRCP · coronal · 3.0mm · 1.12mm/px · 1 of 25 slices shown]
[im 1/25]
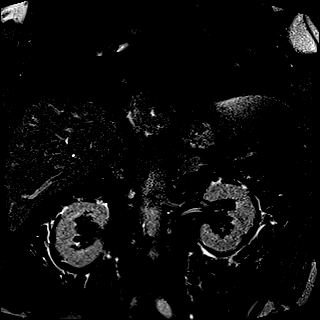

[Series 16: radials · coronal · 50.0mm · 0.78mm/px · 1 of 5 slices shown]
[im 1/5]
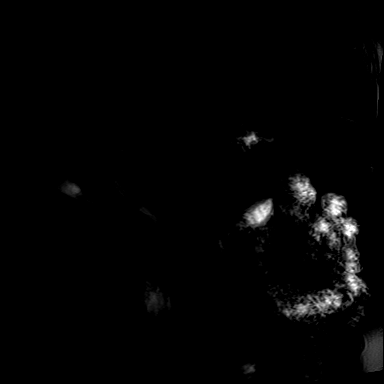

[Series 17: T1 dynamic fat-sat · axial · non-contrast · 3.5mm · 1.19mm/px · z∈[-194,+82]mm · 3 of 80 slices shown (1 of 5)]
[im 1/80]
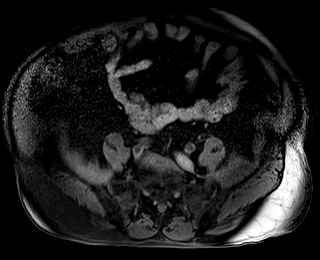
[im 40/80]
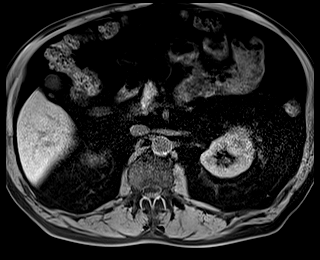
[im 80/80]
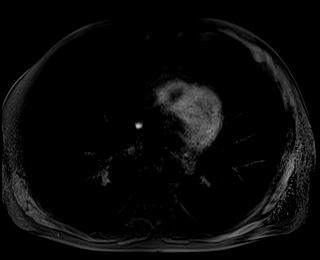

[Series 18: T1 dynamic fat-sat post-contrast · axial · 3.5mm · 1.19mm/px · z∈[-194,+82]mm · 3 of 80 slices shown (1 of 4)]
[im 1/80]
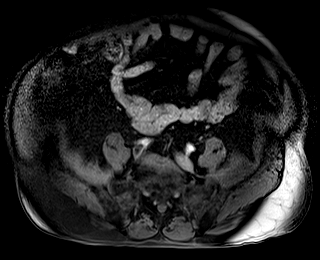
[im 40/80]
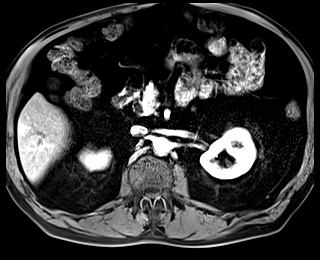
[im 80/80]
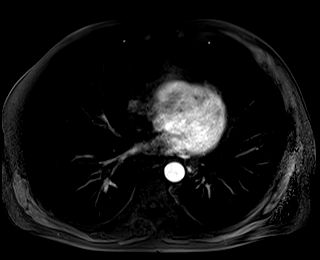

[Series 19: T1 dynamic fat-sat · axial · 3.5mm · 1.19mm/px · z∈[-194,+82]mm · 3 of 80 slices shown (2 of 5)]
[im 1/80]
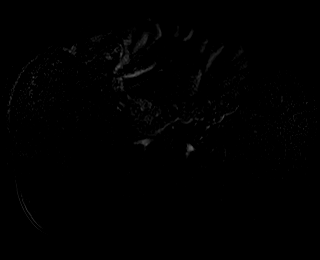
[im 40/80]
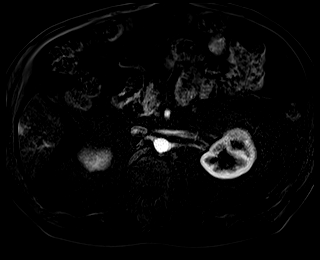
[im 80/80]
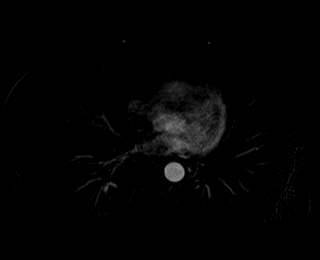

[Series 20: T1 dynamic fat-sat post-contrast · axial · 3.5mm · 1.19mm/px · z∈[-194,+82]mm · 3 of 80 slices shown (2 of 4)]
[im 1/80]
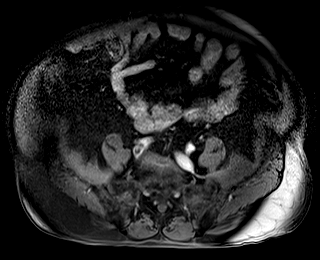
[im 40/80]
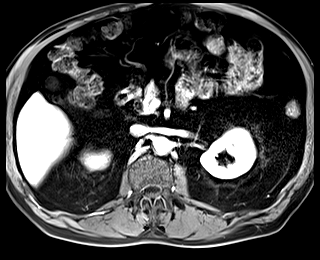
[im 80/80]
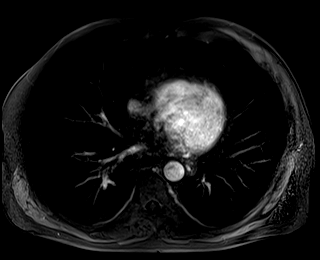

[Series 21: T1 dynamic fat-sat · axial · 3.5mm · 1.19mm/px · z∈[-194,+82]mm · 3 of 80 slices shown (3 of 5)]
[im 1/80]
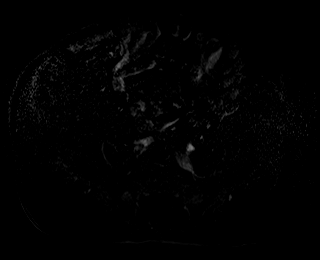
[im 40/80]
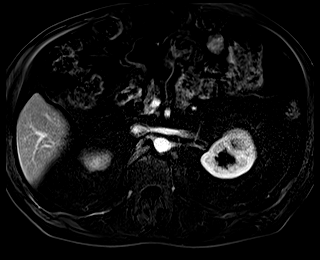
[im 80/80]
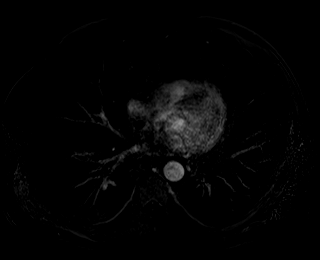

[Series 22: T1 dynamic fat-sat post-contrast · axial · 3.5mm · 1.19mm/px · z∈[-194,+82]mm · 3 of 80 slices shown (3 of 4)]
[im 1/80]
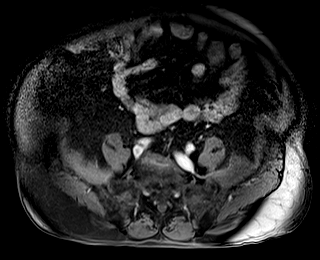
[im 40/80]
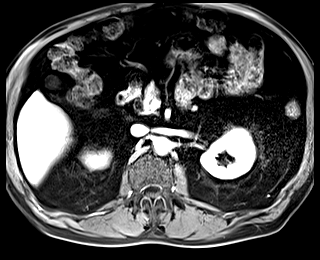
[im 80/80]
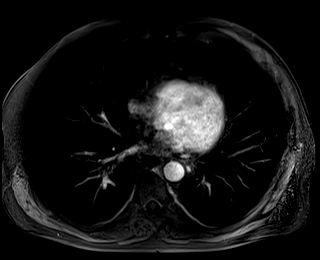

[Series 23: T1 dynamic fat-sat · axial · 3.5mm · 1.19mm/px · z∈[-194,+82]mm · 3 of 80 slices shown (4 of 5)]
[im 1/80]
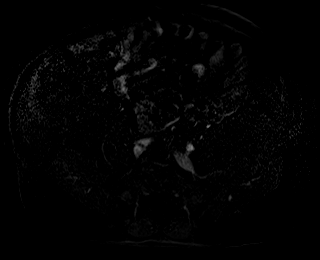
[im 40/80]
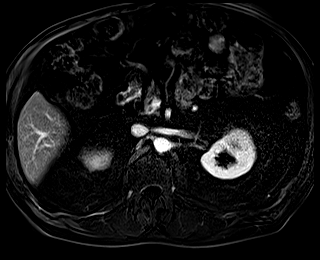
[im 80/80]
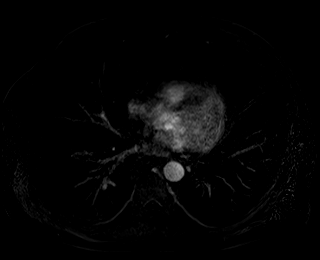

[Series 24: T1 dynamic post-contrast · coronal · 3.5mm · 1.31mm/px · 3 of 80 slices shown]
[im 1/80]
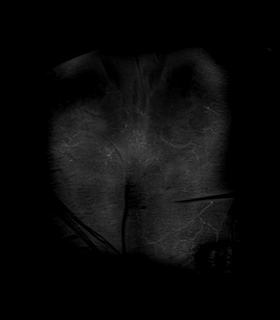
[im 40/80]
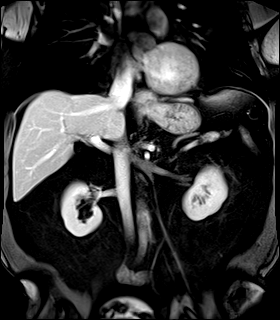
[im 80/80]
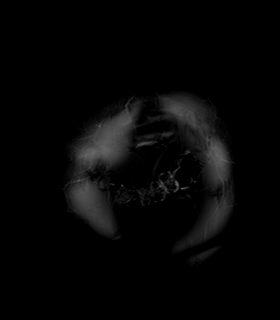

[Series 25: T1 dynamic fat-sat post-contrast · axial · 3.5mm · 1.19mm/px · z∈[-194,+82]mm · 3 of 80 slices shown (4 of 4)]
[im 1/80]
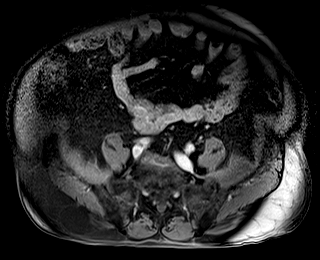
[im 40/80]
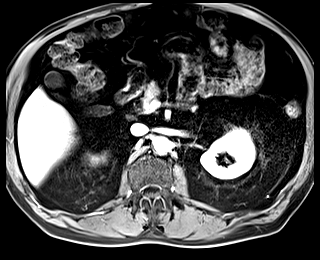
[im 80/80]
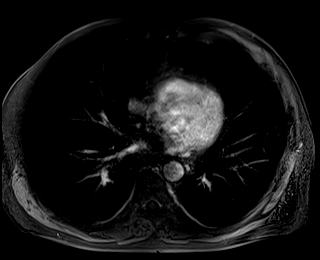

[Series 26: T1 dynamic fat-sat · axial · 3.5mm · 1.19mm/px · z∈[-194,+82]mm · 3 of 80 slices shown (5 of 5)]
[im 1/80]
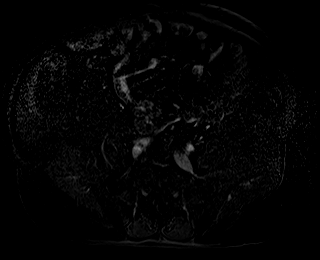
[im 40/80]
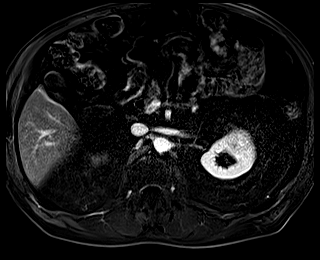
[im 80/80]
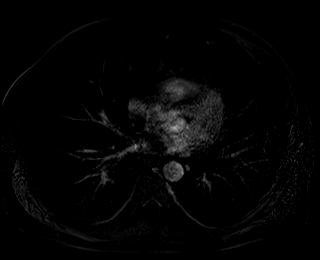

[44 of 48 positions shown; findings below may reference images not displayed]

FINDINGS: Lower chest: No acute findings.

Hepatobiliary: Liver is normal in size and contour. There is a 13 mm
hyperintense T2 signal lesion in the central right hepatic lobe
which demonstrates early discontinuous and gradually increasing
postcontrast enhancement, also stable in size since previous
ultrasound consistent with a hemangioma. There are a few additional
foci of early arterial parenchymal enhancement in the liver which
measure up to 16 mm peripherally in segment 6, with no corresponding
signal abnormalities on other sequences likely representing vascular
anomalies. Gallbladder appears normal. Common bile duct is upper
normal caliber measuring 7 mm with no intraluminal filling defects
visualized.

Pancreas: Mildly atrophic with no suspicious mass or ductal
dilatation identified.

Spleen:  Within normal limits in size and appearance.

Adrenals/Urinary Tract: Bilateral adrenal nodules which demonstrate
signal dropout on out of phase T1 consistent with adenomas measuring
0.9 cm on the right and 2.4 cm on the left. Kidneys appear within
normal limits.

Stomach/Bowel: Small hiatal hernia. No evidence of bowel
obstruction. Colonic diverticulosis.

Vascular/Lymphatic: No pathologically enlarged lymph nodes
identified. No abdominal aortic aneurysm demonstrated.

Other:  No ascites.

Musculoskeletal: No suspicious bone lesions identified.
IMPRESSION: 1. Bilateral benign adrenal adenomas.
2. Small hepatic hemangioma and small likely hepatic parenchymal
vascular anomalies.
3. Small hiatal hernia.  Colonic diverticulosis.

## 2022-04-26 MED ORDER — GADOBUTROL 1 MMOL/ML IV SOLN
8.0000 mL | Freq: Once | INTRAVENOUS | Status: AC | PRN
  Administered 2022-04-26: 8 mL via INTRAVENOUS

## 2022-04-27 ENCOUNTER — Encounter
Admission: RE | Disposition: A | Discharge status: Home / Self Care | Payer: Self-pay | Source: Home / Self Care | Attending: Gastroenterology

## 2022-04-27 ENCOUNTER — Ambulatory Visit: Payer: Medicare Other | Admitting: Certified Registered"

## 2022-04-27 ENCOUNTER — Encounter: Payer: Self-pay | Admitting: *Deleted

## 2022-04-27 ENCOUNTER — Ambulatory Visit
Admission: RE | Admit: 2022-04-27 | Discharge: 2022-04-27 | Disposition: A | Discharge status: Home / Self Care | Payer: Medicare Other | Attending: Gastroenterology | Admitting: Gastroenterology

## 2022-04-27 DIAGNOSIS — J449 Chronic obstructive pulmonary disease, unspecified: Secondary | ICD-10-CM | POA: Insufficient documentation

## 2022-04-27 DIAGNOSIS — Z9981 Dependence on supplemental oxygen: Secondary | ICD-10-CM | POA: Diagnosis not present

## 2022-04-27 DIAGNOSIS — D001 Carcinoma in situ of esophagus: Secondary | ICD-10-CM | POA: Diagnosis not present

## 2022-04-27 HISTORY — PX: ESOPHAGOGASTRODUODENOSCOPY (EGD) WITH PROPOFOL: SHX5813

## 2022-04-27 HISTORY — DX: Other specified disease of esophagus: K22.89

## 2022-04-27 SURGERY — ESOPHAGOGASTRODUODENOSCOPY (EGD) WITH PROPOFOL
Anesthesia: General

## 2022-04-27 MED ORDER — DEXMEDETOMIDINE HCL IN NACL 80 MCG/20ML IV SOLN
INTRAVENOUS | Status: AC
  Filled 2022-04-27: qty 20

## 2022-04-27 MED ORDER — LIDOCAINE HCL (CARDIAC) PF 100 MG/5ML IV SOSY
PREFILLED_SYRINGE | INTRAVENOUS | Status: DC | PRN
Start: 2022-04-27 — End: 2022-04-27
  Administered 2022-04-27: 100 mg via INTRAVENOUS

## 2022-04-27 MED ORDER — SODIUM CHLORIDE 0.9 % IV SOLN: INTRAVENOUS | Status: DC

## 2022-04-27 MED ORDER — PROPOFOL 500 MG/50ML IV EMUL
INTRAVENOUS | Status: AC
  Filled 2022-04-27: qty 50

## 2022-04-27 MED ORDER — PROPOFOL 10 MG/ML IV BOLUS
INTRAVENOUS | Status: DC | PRN
  Administered 2022-04-27: 10 mg via INTRAVENOUS
  Administered 2022-04-27: 50 mg via INTRAVENOUS
  Administered 2022-04-27: 10 mg via INTRAVENOUS

## 2022-04-27 NOTE — Op Note (Signed)
[facility] ?Gastroenterology ?Patient Name: Leslie Sims ?Procedure Date: 04/27/2022 7:52 AM ?MRN: [id number] ?Account #: [id number] ?Date of Birth: 1951-05-24 ?Admit Type: Outpatient ?Age: 71 ?Room: [facility] ENDO ROOM 1 ?Gender: Male ?Note Status: Finalized ?Instrument Name: Upper Endoscope [id number] ?Procedure:             Upper GI endoscopy ?Indications:           Esophageal carcinoma in situ ?Providers:             Andrey Farmer MD, MD ?Referring MD:          Renette Butters. Tamala Julian, MD (Referring MD) ?Medicines:             Monitored Anesthesia Care ?Complications:         No immediate complications. Estimated blood loss:  ?                       Minimal. ?Procedure:             Pre-Anesthesia Assessment: ?                       - Prior to the procedure, a History and Physical was  ?                       performed, and patient medications and allergies were  ?                       reviewed. The patient is competent. The risks and  ?                       benefits of the procedure and the sedation options and  ?                       risks were discussed with the patient. All questions  ?                       were answered and informed consent was obtained.  ?                       Patient identification and proposed procedure were  ?                       verified by the physician, the nurse, the  ?                       anesthesiologist, the anesthetist and the technician  ?                       in the endoscopy suite. Mental Status Examination:  ?                       alert and oriented. Airway Examination: normal  ?                       oropharyngeal airway and neck mobility. Respiratory  ?                       Examination: clear to auscultation. CV Examination:  ?  normal. Prophylactic Antibiotics: The patient does not  ?                       require prophylactic antibiotics. Prior  ?                       Anticoagulants: The patient has taken no previous  ?                        anticoagulant or antiplatelet agents. ASA Grade  ?                       Assessment: III - A patient with severe systemic  ?                       disease. After reviewing the risks and benefits, the  ?                       patient was deemed in satisfactory condition to  ?                       undergo the procedure. The anesthesia plan was to use  ?                       monitored anesthesia care (MAC). Immediately prior to  ?                       administration of medications, the patient was  ?                       re-assessed for adequacy to receive sedatives. The  ?                       heart rate, respiratory rate, oxygen saturations,  ?                       blood pressure, adequacy of pulmonary ventilation, and  ?                       response to care were monitored throughout the  ?                       procedure. The physical status of the patient was  ?                       re-assessed after the procedure. ?                       After obtaining informed consent, the endoscope was  ?                       passed under direct vision. Throughout the procedure,  ?                       the patient's blood pressure, pulse, and oxygen  ?                       saturations were monitored continuously. The Endoscope  ?  was introduced through the mouth, and advanced to the  ?                       second part of duodenum. The upper GI endoscopy was  ?                       accomplished without difficulty. The patient tolerated  ?                       the procedure well. ?Findings: ?     A medium-sized, ulcerating mass with no bleeding and no stigmata of  ?     recent bleeding was found at the gastroesophageal junction. The mass was  ?     partially obstructing and circumferential. Biopsies were taken with a  ?     cold forceps for histology. Estimated blood loss was minimal. ?     The entire examined stomach was normal. ?     The examined duodenum was normal. 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  ?                       Biopsied. ?                       - Normal stomach. ?                       - Normal examined duodenum. ?Recommendation:        - Discharge patient to home. ?                       - Resume previous diet. ?                       - Continue present medications. ?                       - Await pathology results. ?                       - Return to referring physician as previously  ?                       scheduled. ?Procedure Code(s):     --- Professional --- ?                       [phone], Esophagogastroduodenoscopy, flexible,  ?                       transoral; with biopsy, single or multiple ?Diagnosis Code(s):     --- Professional --- ?                       D49.0, Neoplasm of unspecified behavior of digestive  ?                       system ?  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 All rights reserved. ?The codes documented in this report are preliminary and upon coder review may  ?be revised to meet current compliance requirements. ?Andrey Farmer MD, MD ?04/27/2022 8:24:59 AM ?Number of Addenda: 0 ?Note Initiated On: 04/27/2022 7:52 AM ?Estimated Blood Loss:  Estimated blood loss was minimal. ?     [facility] ?

## 2022-04-27 NOTE — Anesthesia Postprocedure Evaluation (Signed)
Anesthesia Post Note ? ?Patient: Leslie Sims [facility] ? ?Procedure(s) Performed: ESOPHAGOGASTRODUODENOSCOPY (EGD) WITH PROPOFOL ? ?Patient location during evaluation: PACU ?Anesthesia Type: General ?Level of consciousness: awake and alert ?Pain management: pain level controlled ?Vital Signs Assessment: post-procedure vital signs reviewed and stable ?Respiratory status: spontaneous breathing, nonlabored ventilation, respiratory function stable and patient connected to nasal cannula oxygen ?Cardiovascular status: blood pressure returned to baseline and stable ?Postop Assessment: no apparent nausea or vomiting ?Anesthetic complications: no ? ? ?No notable events documented. ? ? ?Last Vitals:  ?Vitals:  ? 04/27/22 0821 04/27/22 0831  ?BP: 123/75 139/78  ?Pulse: 72 65  ?Resp: (!) 21 14  ?Temp:    ?SpO2: 99% 100%  ?  ?Last Pain:  ?Vitals:  ? 04/27/22 0831  ?TempSrc:   ?PainSc: 0-No pain  ? ? ?  ?  ?  ?  ?  ?  ? ?Molli Barrows ? ? ? ? ?

## 2022-04-27 NOTE — Interval H&P Note (Signed)
History and Physical Interval Note: ? ?04/27/2022 ?7:56 AM ? ?Leslie Sims  has presented today for surgery, with the diagnosis of Abnormal Scan of GI tract.  The various methods of treatment have been discussed with the patient and family. After consideration of risks, benefits and other options for treatment, the patient has consented to  Procedure(s): ?ESOPHAGOGASTRODUODENOSCOPY (EGD) WITH PROPOFOL (N/A) as a surgical intervention.  The patient's history has been reviewed, patient examined, no change in status, stable for surgery.  I have reviewed the patient's chart and labs.  Questions were answered to the patient's satisfaction.   ? ? ?Leslie Sims ? ?Ok to proceed with EGD ?

## 2022-04-27 NOTE — H&P (Signed)
Outpatient short stay form Pre-procedure ?04/27/2022  ?[name], MD ? ?Primary Physician: [name], MD ? ?Reason for visit:  Abnormal biopsy ? ?History of present illness:   ? ?71 y/o gentleman with COPD on O2 here for repeat EGD for repeat biopsies. No blood thinners. No family history of GI malignancies. Has significant dysphagia. ? ? ? ?Current Facility-Administered Medications:  ?  0.9 %  sodium chloride infusion, , Intravenous, Continuous, Freedom Lopezperez, Hilton Cork, MD, Last Rate: 20 mL/hr at 04/27/22 0717, New Bag at 04/27/22 0717 ? ?Medications Prior to Admission  ?Medication Sig Dispense Refill Last Dose  ? albuterol (VENTOLIN HFA) 108 (90 Base) MCG/ACT inhaler Inhale 2 puffs into the lungs every 6 (six) hours as needed for wheezing or shortness of breath. 18 g 2 04/26/2022  ? amitriptyline (ELAVIL) 25 MG tablet Take 25 mg by mouth at bedtime. 3 tabs at bedtime   04/26/2022  ? aspirin 81 MG tablet Take 81 mg by mouth daily.   Past Week  ? budesonide (PULMICORT) 0.5 MG/2ML nebulizer solution Take 2 mLs (0.5 mg total) by nebulization in the morning and at bedtime. 320 mL 12 04/26/2022  ? esomeprazole (NEXIUM) 40 MG capsule TAKE ONE CAPSULE BY MOUTH ONCE DAILY 30 capsule 4 Past Week  ? furosemide (LASIX) 40 MG tablet TAKE ONE TABLET BY MOUTH ONCE DAILY 90 tablet 2 Past Week  ? ipratropium-albuterol (DUONEB) 0.5-2.5 (3) MG/3ML SOLN QID AND PRN 450 mL 11 04/27/2022  ? LORazepam (ATIVAN) 1 MG tablet Take 1 tablet (1 mg total) by mouth every 4 (four) hours while awake. 30 tablet 0 Past Week  ? losartan (COZAAR) 25 MG tablet Take 25 mg by mouth daily.   Past Week  ? OLANZapine (ZYPREXA) 2.5 MG tablet Take 2.5 mg by mouth at bedtime.   04/26/2022  ? OXYGEN Inhale 2.5 L into the lungs daily.   04/27/2022  ? predniSONE (STERAPRED UNI-PAK 21 TAB) 10 MG (21) TBPK tablet Take by mouth daily. Take 6 tabs by mouth daily  for 2 days, then 5 tabs for 2 days, then 4 tabs for 2 days, then 3 tabs for 2 days, 2 tabs for 2 days, then  1 tab by mouth daily for 2 days 42 tablet 0 Past Week  ? Spacer/Aero-Holding Chambers (AEROCHAMBER MV) inhaler Use as instructed 1 each 2 04/26/2022  ? sucralfate (CARAFATE) 1 g tablet Take 1 g by mouth 4 (four) times daily.   04/26/2022  ? atorvastatin (LIPITOR) 80 MG tablet Take 1 tablet (80 mg total) by mouth daily. 90 tablet 3   ? azithromycin (ZITHROMAX) 250 MG tablet TAKE 1 TABLET BY MOUTH 3 TIMES A WEEK (Patient not taking: Reported on 04/27/2022) 12 tablet 4 Completed Course  ? busPIRone (BUSPAR) 7.5 MG tablet Take by mouth as directed.     ? fluticasone (FLONASE) 50 MCG/ACT nasal spray Place 2 sprays into both nostrils daily.     ? levalbuterol (XOPENEX) 1.25 MG/3ML nebulizer solution Take 1.25 mg by nebulization every 6 (six) hours as needed for wheezing.     ? loratadine (CLARITIN) 10 MG tablet Take 10 mg by mouth daily.     ? mirtazapine (REMERON) 15 MG tablet Take 15 mg by mouth at bedtime.     ? Multiple Vitamin (MULTIVITAMIN) capsule Take 1 capsule by mouth daily.     ? nitroGLYCERIN (NITROSTAT) 0.4 MG SL tablet Place 1 tablet (0.4 mg total) under the tongue every 5 (five) minutes as needed. 25 tablet  3   ? ? ? ?Allergies  ?Allergen Reactions  ? Prozac [Fluoxetine Hcl] Shortness Of Breath  ? Hydroxyzine   ? Effexor Xr [Venlafaxine Hcl Er] Other (See Comments)  ?  "Makes me feel funny"  ? Wellbutrin [Bupropion] Other (See Comments)  ?  "Makes me feel funny"  ? ? ? ?Past Medical History:  ?Diagnosis Date  ? Allergic rhinitis   ? Anxiety   ? Back pain   ? Cancer [facility])   ? prostate  ? CHF (congestive heart failure) ([place])   ? Colon polyps 08/2012  ? colonoscopy; multiple colon polyps; repeat colonoscopy in one year.  ? COPD (chronic obstructive pulmonary disease) ([place])   ? a. on home O2.  ? Coronary artery disease 11/2009  ? a. late presenting ant MI; b. LHC 100% pLAD s/p PCI/DES, 99% mRCA s/p PCI/DES, EF 35%; c. nuclear stress test 05/13: prior ant/inf infarcts w/o ischemia, EF 43%; d. LHC 02/14: widely patent  stents with no other obs dz, EF 40%; e. LHC 11/17: LM nl, mLAD 10%, patent LAD stent, dLAD 20%, p-mRCA 10%, mRCA 40%, patent RCA stent, EF 35-45%   ? Depression   ? Dysphagia   ? GERD (gastroesophageal reflux disease)   ? Headache(784.0)   ? Helicobacter pylori (H. pylori)   ? Hemorrhoids   ? Hernia   ? inguinal  ? HFrEF (heart failure with reduced ejection fraction) ([place])   ? a. 10/2016 LV gram: EF 35-45%; b. 06/2018 Echo: EF 40-45%. c. 01/2021 Echo: EF 40-45%  ? Hyperlipidemia   ? Hypertension   ? Insomnia   ? Ischemic cardiomyopathy   ? a. 10/2016 LV gram: EF 35-45%; b. 06/2018 Echo: EF 40-45%.  ? Myalgia   ? Oxygen dependent   ? Peptic ulcer   ? Pulmonary nodules   ? ST elevation (STEMI) myocardial infarction involving left anterior descending coronary artery ([place]) 11/2009  ? a. s/p PCI to the LAD  ? Status post dilation of esophageal narrowing 2000  ? Thickening of esophagus   ? Tinea pedis   ? Wears dentures   ? partial upper  ? ? ?Review of systems:  Otherwise negative.  ? ? ?Physical Exam ? ?Gen: Alert, oriented. Appears stated age.  ?HEENT: PERRLA. ?Lungs: No respiratory distress ?CV: RRR ?Abd: soft, benign, no masses ?Ext: No edema ? ? ? ?Planned procedures: Proceed with EGD. The patient understands the nature of the planned procedure, indications, risks, alternatives and potential complications including but not limited to bleeding, infection, perforation, damage to internal organs and possible oversedation/side effects from anesthesia. The patient agrees and gives consent to proceed.  ?Please refer to procedure notes for findings, recommendations and patient disposition/instructions.  ? ? ? ?[name], MD ?[facility] ? ? ? ?  ? ?

## 2022-04-27 NOTE — Anesthesia Preprocedure Evaluation (Signed)
Anesthesia Evaluation  ?Patient identified by MRN, date of birth, ID band ?Patient awake ? ? ? ?Reviewed: ?Allergy & Precautions, H&P , NPO status , Patient's Chart, lab work & pertinent test results, reviewed documented beta blocker date and time  ? ?Airway ?Mallampati: II ? ? ?Neck ROM: full ? ? ? Dental ? ?(+) Poor Dentition ?  ?Pulmonary ?pneumonia, COPD,  COPD inhaler and oxygen dependent, former smoker,  ?  ?Pulmonary exam normal ? ? ? ? ? ? ? Cardiovascular ?Exercise Tolerance: Poor ?hypertension, On Medications ?+ angina with exertion + CAD, + Past MI and +CHF  ?Normal cardiovascular exam ?Rhythm:regular Rate:Normal ? ? ?  ?Neuro/Psych ? Headaches, PSYCHIATRIC DISORDERS Anxiety Depression   ? GI/Hepatic ?Neg liver ROS, PUD, GERD  Medicated,  ?Endo/Other  ?negative endocrine ROS ? Renal/GU ?negative Renal ROS  ?negative genitourinary ?  ?Musculoskeletal ? ? Abdominal ?  ?Peds ? Hematology ? ?(+) Blood dyscrasia, anemia ,   ?Anesthesia Other Findings ?Past Medical History: ?No date: Allergic rhinitis ?No date: Anxiety ?No date: Back pain ?No date: Cancer [facility]) ?    Comment:  prostate ?No date: CHF (congestive heart failure) ([place]) ?08/2012: Colon polyps ?    Comment:  colonoscopy; multiple colon polyps; repeat colonoscopy  ?             in one year. ?No date: COPD (chronic obstructive pulmonary disease) ([place]) ?    Comment:  a. on home O2. ?11/2009: Coronary artery disease ?    Comment:  a. late presenting ant MI; b. LHC 100% pLAD s/p PCI/DES, ?             99% mRCA s/p PCI/DES, EF 35%; c. nuclear stress test  ?             05/13: prior ant/inf infarcts w/o ischemia, EF 43%; d.  ?             LHC 02/14: widely patent stents with no other obs dz, EF  ?             40%; e. LHC 11/17: LM nl, mLAD 10%, patent LAD stent,  ?             dLAD 20%, p-mRCA 10%, mRCA 40%, patent RCA stent, EF  ?             35-45%  ?No date: Depression ?No date: Dysphagia ?No date: GERD (gastroesophageal  reflux disease) ?No date: Headache(784.0) ?No date: Helicobacter pylori (H. pylori) ?No date: Hemorrhoids ?No date: Hernia ?    Comment:  inguinal ?No date: HFrEF (heart failure with reduced ejection fraction) ([place]) ?    Comment:  a. 10/2016 LV gram: EF 35-45%; b. 06/2018 Echo: EF  ?             40-45%. c. 01/2021 Echo: EF 40-45% ?No date: Hyperlipidemia ?No date: Hypertension ?No date: Insomnia ?No date: Ischemic cardiomyopathy ?    Comment:  a. 10/2016 LV gram: EF 35-45%; b. 06/2018 Echo: EF  ?             40-45%. ?No date: Myalgia ?No date: Oxygen dependent ?No date: Peptic ulcer ?No date: Pulmonary nodules ?11/2009: ST elevation (STEMI) myocardial infarction involving left  ?anterior descending coronary artery ([place]) ?    Comment:  a. s/p PCI to the LAD ?2000: Status post dilation of esophageal narrowing ?No date: Thickening of esophagus ?No date: Tinea pedis ?No date: Wears dentures ?    Comment:  partial upper ?  Past Surgical History: ?12/20/2012: Admission ?    Comment:  COPD exacerbation.  [place]. ?No date: CARDIAC CATHETERIZATION ?02/05/13: CARDIAC CATHETERIZATION ?    Comment:  [facility] ?10/14: CARDIAC CATHETERIZATION ?    Comment:  [facility] : patent stents with no change in anatomy. EF: 40$ ?11/12/2016: CARDIAC CATHETERIZATION; Left ?    Comment:  Procedure: Left Heart Cath and Coronary Angiography;   ?             Surgeon: Wellington Hampshire, MD;  Location: [place]  ?             CV LAB;  Service: Cardiovascular;  Laterality: Left; ?No date: COLONOSCOPY ?05/18/2016: COLONOSCOPY WITH PROPOFOL; N/A ?    Comment:  Procedure: COLONOSCOPY WITH PROPOFOL;  Surgeon: Evangeline Gula  ?             Allen Norris, MD;  Location: [place] ENDOSCOPY;  Service: Endoscopy; ?             Laterality: N/A; ?03/19/2020: COLONOSCOPY WITH PROPOFOL; N/A ?    Comment:  Procedure: COLONOSCOPY WITH PROPOFOL;  Surgeon: Marius Ditch,  ?             Tally Due, MD;  Location: [facility] ENDOSCOPY;  Service:  ?             Gastroenterology;  Laterality: N/A; ?09/2009: CORONARY  ANGIOPLASTY WITH STENT PLACEMENT ?    Comment:  LAD 3.0 X23 mm Xience DES, RCA: 4.0 X 15 mm Xience DES ?10/27/2017: ELECTROMAGNETIC NAVIGATION BROCHOSCOPY; N/A ?    Comment:  Procedure: ELECTROMAGNETIC NAVIGATION BRONCHOSCOPY;   ?             Surgeon: Flora Lipps, MD;  Location: [facility] ORS;  Service: ?             Cardiopulmonary;  Laterality: N/A; ?No date: ESOPHAGEAL DILATION ?2008: ESOPHAGOGASTRODUODENOSCOPY ?08/20/2012: ESOPHAGOGASTRODUODENOSCOPY ?04/19/2022: ESOPHAGOGASTRODUODENOSCOPY (EGD) WITH PROPOFOL; N/A ?    Comment:  Procedure: ESOPHAGOGASTRODUODENOSCOPY (EGD) WITH  ?             PROPOFOL;  Surgeon: [name], MD;  Location:  ?             [place] ENDOSCOPY;  Service: Endoscopy;  Laterality: N/A; ?07/20/2012: HERNIA REPAIR ?    Comment:  L inguinal hernia repair ?No date: PENILE PROSTHESIS IMPLANT ? ? Reproductive/Obstetrics ?negative OB ROS ? ?  ? ? ? ? ? ? ? ? ? ? ? ? ? ?  ?  ? ? ? ? ? ? ? ? ?Anesthesia Physical ?Anesthesia Plan ? ?ASA: 4 ? ?Anesthesia Plan: General  ? ?Post-op Pain Management:   ? ?Induction:  ? ?PONV Risk Score and Plan:  ? ?Airway Management Planned:  ? ?Additional Equipment:  ? ?Intra-op Plan:  ? ?Post-operative Plan:  ? ?Informed Consent: I have reviewed the patients History and Physical, chart, labs and discussed the procedure including the risks, benefits and alternatives for the proposed anesthesia with the patient or authorized representative who has indicated his/her understanding and acceptance.  ? ? ? ?Dental Advisory Given ? ?Plan Discussed with: CRNA ? ?Anesthesia Plan Comments:   ? ? ? ? ? ? ?Anesthesia Quick Evaluation ? ?

## 2022-04-27 NOTE — Transfer of Care (Addendum)
Immediate Anesthesia Transfer of Care Note ? ?Patient: Leslie Sims ? ?Procedure(s) Performed: ESOPHAGOGASTRODUODENOSCOPY (EGD) WITH PROPOFOL ? ?Patient Location: PACU and Endoscopy Unit ? ?Anesthesia Type:General ? ?Level of Consciousness: drowsy ? ?Airway & Oxygen Therapy: Patient connected to nasal cannula oxygen ? ?Post-op Assessment: Post -op Vital signs reviewed and stable. RN ? ?Post vital signs: stable ? ?Last Vitals:  ?Vitals Value Taken Time  ?BP    ?Temp    ?Pulse    ?Resp    ?SpO2    ? ? ?Last Pain:  ?Vitals:  ? 04/27/22 0703  ?TempSrc: Temporal  ?   ? ?  ? ?Complications: No notable events documented. ?

## 2022-04-28 ENCOUNTER — Encounter: Payer: Self-pay | Admitting: Gastroenterology

## 2022-04-28 LAB — SURGICAL PATHOLOGY

## 2022-04-29 ENCOUNTER — Encounter: Payer: Self-pay | Admitting: Pulmonary Disease

## 2022-04-29 ENCOUNTER — Ambulatory Visit (INDEPENDENT_AMBULATORY_CARE_PROVIDER_SITE_OTHER): Payer: Medicare Other | Admitting: Pulmonary Disease

## 2022-04-29 VITALS — BP 128/74 | HR 73 | Temp 97.8°F | Ht 70.0 in | Wt 184.2 lb

## 2022-04-29 DIAGNOSIS — J449 Chronic obstructive pulmonary disease, unspecified: Secondary | ICD-10-CM

## 2022-04-29 DIAGNOSIS — J9611 Chronic respiratory failure with hypoxia: Secondary | ICD-10-CM

## 2022-04-29 DIAGNOSIS — C159 Malignant neoplasm of esophagus, unspecified: Secondary | ICD-10-CM

## 2022-04-29 MED ORDER — ALBUTEROL SULFATE HFA 108 (90 BASE) MCG/ACT IN AERS
2.0000 | INHALATION_SPRAY | Freq: Four times a day (QID) | RESPIRATORY_TRACT | 4 refills | Status: DC | PRN
Start: 2022-04-29 — End: 2022-10-04

## 2022-04-29 MED ORDER — ALBUTEROL SULFATE HFA 108 (90 BASE) MCG/ACT IN AERS: 2.0000 | INHALATION_SPRAY | Freq: Four times a day (QID) | RESPIRATORY_TRACT | 4 refills | Status: DC | PRN

## 2022-04-29 NOTE — Progress Notes (Signed)
Subjective:    Patient ID: Leslie Sims, male    DOB: 1951/12/01, 71 y.o.   MRN: [id number] Patient Care Team: [name], MD as PCP - General (Family Medicine) Iran Ouch, MD as PCP - Cardiology (Cardiology) [name], MD (Family Medicine) Iran Ouch, MD as Consulting Physician (Cardiology) Glory Buff, RN as Registered Nurse Salena Saner, MD as Consulting Physician (Pulmonary Disease) Carmina Miller, MD as Radiation Oncologist (Radiation Oncology)  Patient ID: Leslie Sims, male    DOB: [birth year], 71 y.o.   MRN: [id number] Requesting MD/Service: Nilda Simmer, MD Date of initial consultation: 08/10/16 by Dr. Sung Amabile Reason for consultation: COPD, smoker   PT PROFILE: 32 M, former smoker quit 02/10/2019 with severe emphysema seen by multiple pulmonologists in past ([place], [place], [place], [place]) referred for further eval and mgmt of COPD.  Previously seen by Dr. Sung Amabile.   DATA: 07/24/12 Office Spirometry: Severe obstruction (FEV1 31% pred) 08/31/13 Office Spirometry: very severe obstruction (FEV1 25% pred) 06/18/15 CT chest: Severe emphysema 03/03/17 Overnight oximetry: Lowest SpO2 72%. 95% of time SpO2 was below 90% 03/03/17 : 384 meters. Desat to 84% 10/04/17 CT chest: Two new irregularly marginated nodules, one in the RUL, and a second in the mid LUL worrisome for either synchronous lung ca or metastatic involvement of the lungs. The two small nodules described on the prior dictated report have resolved in the left superior segment and most likely were postinflammatory. Diffuse centrilobular emphysema. 10/13/17 PET scan: The new left upper lobe nodule is FDG avid consistent with malignancy. Recommend tissue confirmation. The new nodule in the right upper lobe demonstrates low level uptake. While the level of uptake suggests the possibility of an inflammatory or infectious process, a low-grade malignancy is not excluded. Given the  suspected malignancy on the left, tissue confirmation should be considered on the right despite the low level of uptake. No distant metastatic disease identified 10/27/17 ENB ([facility]): Transbronchial Fine Needle Aspirations 21G X7, Transbronchial Forceps Biopsy X6. ALVEOLATED LUNG TISSUE WITH HEMOSIDERIN LADEN MACROPHAGES.  NEGATIVE FOR ATYPIA AND MALIGNANCY 11/01/17 PFTs: FVC: 2.96 > 3.14 L (68 > 72 %pred), FEV1: 0.94 > 0.93 L (27 %pred), FEV1/FVC: 32%, TLC: 9.04 L (137 %pred), DLCO 62 %pred 12/07/17 CTA chest: No pulmonary embolus identified. Anterior left upper lobe ground-glass and nodular consolidation likely representing pneumonitis. Stable bilateral upper lobe pulmonary nodules. Stable emphysema. 01/21/18 CT chest: new masslike architectural distortion and ground-glass attenuation surrounding the index nodule within the RUL. The appearance favors an inflammatory or infectious process. The index nodule in the left upper lobe is slightly decreased in volume when compared with the previous exam favoring a benign process. Attention on follow-up imaging advise. Advanced changes of emphysema (very severe) 06/01/18 CTA chest: No evidence of pulmonary embolism. Waxing and waning bilateral pulmonary nodular process as described. New 8 mm nodular ill-defined focus of opacification over the lingula which may represent an acute inflammatory or infectious process. No effusion. Emphysema 10/19/18 CTA chest: No acute cardiopulmonary disease and no evidence of pulm embolism. Hospitalization 1/12-1/14/2020: COPD exacerbation Hospitalization 2/22-2/27/20: COPD exacerbation, HCAP 02/11/19 CT chest: Progressed masslike consolidation within the RUL in the region of previous waxing and waning nodule, this is contiguous with the pleural surface and demonstrates spiculated margin and probable small focus of central cavitation. Findings could be secondary to infection superimposed on chronic nodule. However cavitary neoplasm  is also a concern. Multiple additional lung nodules which have not significantly changed. Small foci of ground-glass  density in the RUL may reflect small focus of infection. Mod-severe emphysema 05/01/19 CT chest: Waxing/waning right lung opacities, suggesting infection, including atypical/mycobacterial infection. Improvement of the prior dominant masslike opacity argues against neoplasm. Stable left upper lobe nodule, likely benign. Aortic Atherosclerosis and Emphysema 11/02/2019 CT chest: Multiple larger pulmonary nodules and masslike areas seen on the prior study have largely regressed.  Findings indicative of infectious/inflammatory process.  Diffuse bronchial wall thickening with moderate centrilobular and paraseptal emphysema.  Suggestion of left main and three-vessel coronary artery disease. 04/23/2020 CT chest: "Multiple bilateral stable pulmonary nodules. New 7 mm nodule over the posterior left upper lobe. Stable nodularity over an area ofright upper lobe/apical scarring with new 9 mm nodular focus along the most inferior aspect of this scarring. This area has shown to wax and wane on previous exams and is most likely inflammatory". 07/24/2020 CT chest: "Background of marked pulmonary emphysema with stable post infectious or inflammatory changes and with near complete resolution of a presumably infectious or inflammatory nodule in the LEFT upper lobe as compared to previous exam". 01/21/2021 CT chest: Severe emphysematous changes, pulmonary scarring, bilateral calcified granulomas. 01/29/2021 PFTs: Further decline in lung function.  FEV1 0.74 L or 22% predicted, FVC 1.91 L or 42% predicted.  No significant bronchodilator response.  FEV1/FVC 39%.  Patient could not perform lung volumes.  Diffusion capacity severely reduced.  This is consistent with very severe COPD. 02/13/2021 2D echo: LVEF 40 to 45% global hypokinesis, severe hypokinesis of the periapical region.  Grade II diastolic dysfunction, dilated  left atrium.   INTERVAL: Last seen by  me on 01/28/2022.   Multiple visits to ED due to congestive heart failure, chest pain and COPD exacerbations.  Has had issues with postprandial vomiting and abdominal pain.  Being evaluated by GI.  EGD x 2 , 1 May and 9 May by Dr. Mia Creek.   Chief Complaint  Patient presents with   Follow-up    C/o SOB with exertion, mild dry cough and wheezing.     HPI The patient is a 71 year old former smoker (quit 2020, 126 PY) with severe stage IV COPD and chronic hypoxic respiratory failure on supplemental oxygen who presents here for follow-up on these issues.  His respiratory status is also compromised by ischemic cardiomyopathy with LVEF of 40% and grade II diastolic dysfunction giving him chronic combined systolic and diastolic heart failure.  He has frequent visits to the ED due to COPD exacerbations and CHF exacerbations.  Most recently however his ED visits have been due to issues with early satiety and weight loss.  He has undergone EGD by Dr. Mia Creek.  It appears that he has newly diagnosed squamous cell carcinoma of the esophagus.  Has been referred to oncology.  He is not a surgical candidate.  With regards to his COPD issues he feels that he is well compensated on his current regimen.  He does need a refill on his albuterol.  He had been switched to levo albuterol while the albuterol shortage occurred however now he can procure albuterol again.  He has not had any change in the character of his cough.  No change in sputum production.  No hemoptysis.  Dyspnea on exertion is at baseline.  He is compliant with oxygen at 2 L/min.  He does not endorse any other symptomatology today.  He is of course very concerned about the recent findings on EGD.   Review of Systems A 10 point review of systems was performed and it is  as noted above otherwise negative.  Patient Active Problem List   Diagnosis Date Noted   COPD exacerbation (HCC) 07/31/2021   Prostate  cancer (HCC) 10/11/2020   Elevated PSA 07/28/2020   Iron deficiency anemia 05/20/2020   Goals of care, counseling/discussion    Palliative care by specialist    Healthcare-associated pneumonia 01/28/2019   COPD with acute exacerbation (HCC) 12/31/2018   Generalized anxiety disorder 11/12/2018   Moderate episode of recurrent major depressive disorder (HCC) 11/12/2018   Acute respiratory failure with hypoxia (HCC) 09/29/2018   Acute respiratory failure with hypoxia and hypercapnia (HCC) 09/13/2018   Acute on chronic respiratory failure with hypoxia and hypercapnia (HCC) 08/24/2018   Chest pain 07/02/2018   Grief reaction 01/13/2018   COPD with hypoxia (HCC) 12/25/2017   Unstable angina (HCC)    Benign neoplasm of ascending colon    Benign neoplasm of descending colon    Benign neoplasm of sigmoid colon    History of colonic polyps    Pulmonary nodules/lesions, multiple 01/05/2015   Bruit of left carotid artery 11/04/2014   Sleep disorder 07/18/2013   Seasonal and perennial allergic rhinitis 05/29/2013   Bradycardia 04/20/2011   SMOKER 07/30/2010   CAD, NATIVE VESSEL 04/23/2010   Hyperlipidemia 03/24/2010   DEPRESSION/ANXIETY 03/24/2010   Chronic systolic heart failure (HCC) 03/24/2010   COPD, very severe (HCC) 03/24/2010   GERD 03/24/2010   Essential hypertension 11/21/2009   Social History   Tobacco Use   Smoking status: Former    Packs/day: 3.00    Years: 42.00    Pack years: 126.00    Types: Cigarettes    Quit date: 02/10/2019    Years since quitting: 3.2   Smokeless tobacco: Never   Tobacco comments:    25 months ago most smoked 4 packs a day .  Substance Use Topics   Alcohol use: No    Alcohol/week: 0.0 standard drinks   Allergies  Allergen Reactions   Prozac [Fluoxetine Hcl] Shortness Of Breath   Hydroxyzine    Effexor Xr [Venlafaxine Hcl Er] Other (See Comments)    "Makes me feel funny"   Wellbutrin [Bupropion] Other (See Comments)    "Makes me feel  funny"   Current Meds  Medication Sig   amitriptyline (ELAVIL) 25 MG tablet Take 25 mg by mouth at bedtime. 3 tabs at bedtime   aspirin 81 MG tablet Take 81 mg by mouth daily.   azithromycin (ZITHROMAX) 250 MG tablet TAKE 1 TABLET BY MOUTH 3 TIMES A WEEK   budesonide (PULMICORT) 0.5 MG/2ML nebulizer solution Take 2 mLs (0.5 mg total) by nebulization in the morning and at bedtime.   busPIRone (BUSPAR) 7.5 MG tablet Take by mouth as directed.   esomeprazole (NEXIUM) 40 MG capsule TAKE ONE CAPSULE BY MOUTH ONCE DAILY   fluticasone (FLONASE) 50 MCG/ACT nasal spray Place 2 sprays into both nostrils daily.   furosemide (LASIX) 40 MG tablet TAKE ONE TABLET BY MOUTH ONCE DAILY   levalbuterol (XOPENEX) 1.25 MG/3ML nebulizer solution Take 1.25 mg by nebulization every 6 (six) hours as needed for wheezing.   loratadine (CLARITIN) 10 MG tablet Take 10 mg by mouth daily.   LORazepam (ATIVAN) 1 MG tablet Take 1 tablet (1 mg total) by mouth every 4 (four) hours while awake.   losartan (COZAAR) 25 MG tablet Take 25 mg by mouth daily.   mirtazapine (REMERON) 15 MG tablet Take 15 mg by mouth at bedtime.   Multiple Vitamin (MULTIVITAMIN) capsule Take 1 capsule by  mouth daily.   nitroGLYCERIN (NITROSTAT) 0.4 MG SL tablet Place 1 tablet (0.4 mg total) under the tongue every 5 (five) minutes as needed.   OLANZapine (ZYPREXA) 2.5 MG tablet Take 2.5 mg by mouth at bedtime.   OXYGEN Inhale 2.5 L into the lungs daily.   Spacer/Aero-Holding Chambers (AEROCHAMBER MV) inhaler Use as instructed   sucralfate (CARAFATE) 1 g tablet Take 1 g by mouth 4 (four) times daily.   [DISCONTINUED] albuterol (VENTOLIN HFA) 108 (90 Base) MCG/ACT inhaler Inhale 2 puffs into the lungs every 6 (six) hours as needed for wheezing or shortness of breath.   [DISCONTINUED] albuterol (VENTOLIN HFA) 108 (90 Base) MCG/ACT inhaler Inhale 2 puffs into the lungs every 6 (six) hours as needed for wheezing or shortness of breath.   [DISCONTINUED]  ipratropium-albuterol (DUONEB) 0.5-2.5 (3) MG/3ML SOLN QID AND PRN   [DISCONTINUED] predniSONE (STERAPRED UNI-PAK 21 TAB) 10 MG (21) TBPK tablet Take by mouth daily. Take 6 tabs by mouth daily  for 2 days, then 5 tabs for 2 days, then 4 tabs for 2 days, then 3 tabs for 2 days, 2 tabs for 2 days, then 1 tab by mouth daily for 2 days   Immunization History  Administered Date(s) Administered   Fluad Quad(high Dose 65+) 11/16/2021   Influenza Whole 09/19/2012   Influenza, High Dose Seasonal PF 09/28/2019   Influenza,inj,Quad PF,6+ Mos 10/24/2013, 11/25/2014, 03/02/2016, 10/27/2016, 10/05/2017   Influenza-Unspecified 10/24/2013, 11/25/2014, 03/02/2016, 10/27/2016, 10/05/2017, 09/18/2018   Moderna Sars-Covid-2 Vaccination 01/26/2020, 02/23/2020   PFIZER Comirnaty(Gray Top)Covid-19 Tri-Sucrose Vaccine 12/15/2020, 07/15/2021   PFIZER(Purple Top)SARS-COV-2 Vaccination 01/26/2020, 02/23/2020   Pfizer Covid-19 Vaccine Bivalent Booster 79yrs & up 01/14/2022   Pneumococcal Conjugate-13 11/25/2014   Pneumococcal Polysaccharide-23 08/24/2016, 10/27/2016   Pneumococcal-Unspecified 12/20/2008   Tdap 12/20/2008       Objective:   Physical Exam BP 128/74 (BP Location: Left Arm, Cuff Size: Normal)   Pulse 73   Temp 97.8 F (36.6 C) (Temporal)   Ht 5\' 10"  (1.778 m)   Wt 184 lb 3.2 oz (83.6 kg)   SpO2 93%   BMI 26.43 kg/m   SpO2: 93 % O2 Device: Nasal cannula O2 Flow Rate (L/min): 2 L/min O2 Type: Pulse O2  GENERAL: Well-developed well-nourished gentleman, chronic use of accessories, no conversational dyspnea. No acute distress.  Presents in transport chair due to dyspnea. Using oxygen via portable concentrator at 2 L pulsed. HEAD: Normocephalic, atraumatic. EYES: Pupils equal, round, reactive to light.  No scleral icterus. MOUTH: Wears dentures. NECK: Supple. No thyromegaly. No nodules. No JVD.  Trachea midline, no crepitus. PULMONARY: Very distant breath sounds, no adventitious  sounds. CARDIOVASCULAR: S1 and S2. Regular rate and rhythm.  No rubs murmurs or gallops heard. GASTROINTESTINAL: Slightly protuberant abdomen, soft, benign. MUSCULOSKELETAL: No joint deformity, no clubbing, no edema. NEUROLOGIC: Awake, alert, oriented.  No overt focal deficits.  Speech is fluent. SKIN: Intact,warm,dry.  Limited exam: No rashes. PSYCH: Normal mood and behavior.         Assessment & Plan:     ICD-10-CM   1. Stage 4 very severe COPD by GOLD classification (HCC)  J44.9    Continue nebulization treatments Albuterol refilled    2. Chronic hypoxemic respiratory failure (HCC)  J96.11    Continue oxygen at 2 L/min Patient is compliant Notes benefit of therapy    3. Malignant neoplasm of esophagus, unspecified location [facility])  C15.9    Has undergone EGD x 2 Being managed by GI Being referred to oncology  Meds ordered this encounter  Medications   albuterol (VENTOLIN HFA) 108 (90 Base) MCG/ACT inhaler    Sig: Inhale 2 puffs into the lungs every 6 (six) hours as needed for wheezing or shortness of breath.    Dispense:  18 g    Refill:  4   albuterol (VENTOLIN HFA) 108 (90 Base) MCG/ACT inhaler    Sig: Inhale 2 puffs into the lungs every 6 (six) hours as needed for wheezing or shortness of breath.    Dispense:  18 g    Refill:  4    Will see the patient in follow-up in 3 months time call sooner should any new problems arise.  Gailen Shelter, MD Advanced Bronchoscopy PCCM [facility]    *This note was dictated using voice recognition software/Dragon.  Despite best efforts to proofread, errors can occur which can change the meaning. Any transcriptional errors that result from this process are unintentional and may not be fully corrected at the time of dictation.

## 2022-04-29 NOTE — Patient Instructions (Signed)
I have sent in a new prescription for your albuterol. ? ?We will see you in follow-up in 3 months time call sooner should any new problems arise. ? ?Lease let us know what [name] says with regards to your stomach and esophagus issues. ?

## 2022-05-03 ENCOUNTER — Telehealth: Payer: Self-pay

## 2022-05-03 NOTE — Telephone Encounter (Signed)
Called and spoke to Leslie Sims. Appointment provided for medical oncology 05/04/22 at 1115 with Dr. Tasia Catchings. ?

## 2022-05-04 ENCOUNTER — Encounter: Payer: Self-pay | Admitting: Oncology

## 2022-05-04 ENCOUNTER — Other Ambulatory Visit: Payer: Self-pay

## 2022-05-04 ENCOUNTER — Inpatient Hospital Stay: Payer: Medicare Other

## 2022-05-04 ENCOUNTER — Inpatient Hospital Stay: Payer: Medicare Other | Attending: Oncology | Admitting: Oncology

## 2022-05-04 VITALS — BP 138/73 | HR 92 | Temp 97.2°F | Resp 20 | Wt 182.1 lb

## 2022-05-04 DIAGNOSIS — Z801 Family history of malignant neoplasm of trachea, bronchus and lung: Secondary | ICD-10-CM | POA: Insufficient documentation

## 2022-05-04 DIAGNOSIS — C159 Malignant neoplasm of esophagus, unspecified: Secondary | ICD-10-CM | POA: Diagnosis not present

## 2022-05-04 DIAGNOSIS — J449 Chronic obstructive pulmonary disease, unspecified: Secondary | ICD-10-CM | POA: Diagnosis not present

## 2022-05-04 DIAGNOSIS — Z8546 Personal history of malignant neoplasm of prostate: Secondary | ICD-10-CM | POA: Insufficient documentation

## 2022-05-04 DIAGNOSIS — Z7189 Other specified counseling: Secondary | ICD-10-CM

## 2022-05-04 DIAGNOSIS — I10 Essential (primary) hypertension: Secondary | ICD-10-CM | POA: Insufficient documentation

## 2022-05-04 DIAGNOSIS — J9610 Chronic respiratory failure, unspecified whether with hypoxia or hypercapnia: Secondary | ICD-10-CM | POA: Diagnosis not present

## 2022-05-04 DIAGNOSIS — R918 Other nonspecific abnormal finding of lung field: Secondary | ICD-10-CM

## 2022-05-04 DIAGNOSIS — E119 Type 2 diabetes mellitus without complications: Secondary | ICD-10-CM | POA: Insufficient documentation

## 2022-05-04 DIAGNOSIS — I509 Heart failure, unspecified: Secondary | ICD-10-CM | POA: Insufficient documentation

## 2022-05-04 DIAGNOSIS — I2510 Atherosclerotic heart disease of native coronary artery without angina pectoris: Secondary | ICD-10-CM | POA: Insufficient documentation

## 2022-05-04 DIAGNOSIS — C61 Malignant neoplasm of prostate: Secondary | ICD-10-CM

## 2022-05-04 DIAGNOSIS — Z87891 Personal history of nicotine dependence: Secondary | ICD-10-CM | POA: Insufficient documentation

## 2022-05-04 LAB — CBC WITH DIFFERENTIAL/PLATELET
Abs Immature Granulocytes: 0.05 10*3/uL (ref 0.00–0.07)
Basophils Absolute: 0.1 10*3/uL (ref 0.0–0.1)
Basophils Relative: 1 %
Eosinophils Absolute: 0.2 10*3/uL (ref 0.0–0.5)
Eosinophils Relative: 2 %
HCT: 42.9 % (ref 39.0–52.0)
Hemoglobin: 13.6 g/dL (ref 13.0–17.0)
Immature Granulocytes: 1 %
Lymphocytes Relative: 12 %
Lymphs Abs: 1.1 10*3/uL (ref 0.7–4.0)
MCH: 28.8 pg (ref 26.0–34.0)
MCHC: 31.7 g/dL (ref 30.0–36.0)
MCV: 90.7 fL (ref 80.0–100.0)
Monocytes Absolute: 0.9 10*3/uL (ref 0.1–1.0)
Monocytes Relative: 10 %
Neutro Abs: 7.2 10*3/uL (ref 1.7–7.7)
Neutrophils Relative %: 74 %
Platelets: 282 10*3/uL (ref 150–400)
RBC: 4.73 MIL/uL (ref 4.22–5.81)
RDW: 12.8 % (ref 11.5–15.5)
WBC: 9.5 10*3/uL (ref 4.0–10.5)
nRBC: 0 % (ref 0.0–0.2)

## 2022-05-04 LAB — COMPREHENSIVE METABOLIC PANEL
ALT: 20 U/L (ref 0–44)
AST: 18 U/L (ref 15–41)
Albumin: 4 g/dL (ref 3.5–5.0)
Alkaline Phosphatase: 92 U/L (ref 38–126)
Anion gap: 11 (ref 5–15)
BUN: 8 mg/dL (ref 8–23)
CO2: 31 mmol/L (ref 22–32)
Calcium: 9.1 mg/dL (ref 8.9–10.3)
Chloride: 99 mmol/L (ref 98–111)
Creatinine, Ser: 0.87 mg/dL (ref 0.61–1.24)
GFR, Estimated: >60 mL/min (ref >60)
Glucose, Bld: 104 mg/dL — ABNORMAL HIGH (ref 70–99)
Potassium: 3.9 mmol/L (ref 3.5–5.1)
Sodium: 141 mmol/L (ref 135–145)
Total Bilirubin: 0.7 mg/dL (ref 0.3–1.2)
Total Protein: 7.7 g/dL (ref 6.5–8.1)

## 2022-05-04 LAB — PSA: Prostatic Specific Antigen: 1.1 ng/mL (ref 0.00–4.00)

## 2022-05-04 NOTE — Progress Notes (Signed)
?Hematology/Oncology Consult note ?[phone] [phone] ?  ? ?   ? ? ?Patient Care Team: ?[name], MD as PCP - General (Family Medicine) ?Wellington Hampshire, MD as PCP - Cardiology (Cardiology) ?[name], MD (Family Medicine) ?Wellington Hampshire, MD as Consulting Physician (Cardiology) ?Telford Nab, RN as Equities trader ?[name], MD as Consulting Physician (Pulmonary Disease) ?Noreene Filbert, MD as Radiation Oncologist (Radiation Oncology) ?Clent Jacks, RN as Oncology Nurse Navigator ? ?REFERRING PROVIDER: ?[name], MD  ?CHIEF COMPLAINTS/REASON FOR VISIT:  ?Evaluation of esophageal squamous carcinoma in situ ? ?HISTORY OF PRESENTING ILLNESS:  ? ?Leslie Sims is a  71 y.o.  male with PMH listed below was seen in consultation at the request of  [name], MD  for evaluation of esophageal squamous carcinoma in situ ? ?04/19/2022, EGD by [name] showed partially obstructive likely malignant esophageal tumor was found at GE junction.  Biopsied.  Normal stomach and normal examined duodenum. ?Biopsy pathology showed fragments of the squamous mucosa with high-grade dysplastic.  Scant gastric mucosa present with reactive changes and no dysplastic or significant intestinal metaplastic.  Fragments of fibrillar purulent ulcer diabetes also present. ? ?04/27/2022, repeat EGD and a biopsy of GE junction partially obstructed ulcerating mass. ?Repeat biopsy pathology showed superficial and disaggregated fragments of squamous mucosa with at least severe dysplastic/squamous cell carcinoma in situ.  Definite invasion cannot be identified.  In part secondary to specimen fragmentation and tangential sectioning. ? ?Patient was referred to establish care with oncology for further evaluation.  He is accompanied by her daughter.   ? ?Patient has multiple comorbidities including COPD/chronic respiratory failure, history of STEMI/combined systolic and diastolic  CHF [last LVEF 40 to 45%], lung nodules previously avid on PET scan, biopsy negative and was felt to be calcified granulomas.-Pulmonology Dr. Patsey Berthold ?Patient also has a history of prostate cancer, Gleason score 3+4, status post definitive radiation.-Urology Dr. Diamantina Providence.  ? ?Patient lives by himself.  Independent with ADL and some of the iADL ?Former smoker, 126-pack-year smoking history.  Quit in 2020. ?+ Stomach discomfort for 4 months, dysphagia for both solids and liquids at times. + Weight loss ?Denies any black or bloody stool. ? ?03/10/2022, CT abdomen pelvis with IV contrast was done at [facility]. ?1. Small hiatal hernia with surrounding inflammatory changes. 2. Mildly dilated proximal common bile duct, tapering distally. Recommend correlation with lab values to exclude obstructive biliary process. 3. Indeterminate left adrenal gland nodule. Recommend nonemergent/outpatient CT with contrast adrenal mass protocol for further evaluation.  ?Addendum was added ?-1. There is diffuse marked thickening measuring 2 cm of the distal esophagus with mild paraesophageal inflammation. The findings are concerning for esophagitis or neoplasm. Few nonenlarged lymph nodes are seen along the celiac access, porta hepatis and gastrohepatic ligament measuring up to 0.8 cm.  ?2. Gallbladder is moderately distended. There is mild enhancement of the gallbladder wall at the fundus. In addition, there is thickened wall enhancement which is favored over gallstones near the gallbladder neck measuring up to 0.4 mm (4:53, 4:54). No gallstones were seen on same-day ultrasound. Further imaging with MRCP should be considered.  ?3. Punctate calcification along the pancreatic head likely postinfectious/postinflammatory.  ?4. Bilateral adrenal gland nodules measuring 1.2 and 2.9 cm right and left. Given the size of the larger lesion, dedicated adrenal gland protocol CT or MRI is recommended ? ? ?04/27/2022, MRI abdomen MRCP with and without contrast  at [facility] showed 1. Bilateral benign adrenal  adenomas. ?2. Small hepatic hemangioma and small likely hepatic parenchymal vascular anomalies. ?3. Small hiatal hernia.  Colonic diverticulosis. ?  ? ?Review of Systems  ?Constitutional:  Positive for appetite change and unexpected weight change. Negative for chills, diaphoresis, fatigue and fever.  ?HENT:   Negative for hearing loss, lump/mass, nosebleeds and sore throat.   ?Eyes:  Negative for eye problems and icterus.  ?Respiratory:  Positive for shortness of breath. Negative for chest tightness, cough, hemoptysis and wheezing.   ?Cardiovascular:  Negative for chest pain and leg swelling.  ?Gastrointestinal:  Positive for abdominal pain and vomiting. Negative for abdominal distention, blood in stool, diarrhea, nausea and rectal pain.  ?Endocrine: Negative for hot flashes.  ?Genitourinary:  Negative for bladder incontinence, difficulty urinating, dysuria, frequency, hematuria and nocturia.   ?Musculoskeletal:  Negative for back pain, flank pain, gait problem and myalgias.  ?Skin:  Negative for rash.  ?Neurological:  Negative for dizziness, gait problem, headaches, numbness and seizures.  ?Hematological:  Negative for adenopathy. Does not bruise/bleed easily.  ?Psychiatric/Behavioral:  Negative for confusion and decreased concentration. The patient is not nervous/anxious.   ? ?MEDICAL HISTORY:  ?Past Medical History:  ?Diagnosis Date  ? Allergic rhinitis   ? Anxiety   ? Back pain   ? Cancer [facility])   ? prostate  ? CHF (congestive heart failure) ([place])   ? Colon polyps 08/2012  ? colonoscopy; multiple colon polyps; repeat colonoscopy in one year.  ? COPD (chronic obstructive pulmonary disease) ([place])   ? a. on home O2.  ? Coronary artery disease 11/2009  ? a. late presenting ant MI; b. LHC 100% pLAD s/p PCI/DES, 99% mRCA s/p PCI/DES, EF 35%; c. nuclear stress test 05/13: prior ant/inf infarcts w/o ischemia, EF 43%; d. LHC 02/14: widely patent stents with no other obs  dz, EF 40%; e. LHC 11/17: LM nl, mLAD 10%, patent LAD stent, dLAD 20%, p-mRCA 10%, mRCA 40%, patent RCA stent, EF 35-45%   ? Depression   ? Dysphagia   ? GERD (gastroesophageal reflux disease)   ? Headache(784.0)   ? Helicobacter pylori (H. pylori)   ? Hemorrhoids   ? Hernia   ? inguinal  ? HFrEF (heart failure with reduced ejection fraction) ([place])   ? a. 10/2016 LV gram: EF 35-45%; b. 06/2018 Echo: EF 40-45%. c. 01/2021 Echo: EF 40-45%  ? Hyperlipidemia   ? Hypertension   ? Insomnia   ? Ischemic cardiomyopathy   ? a. 10/2016 LV gram: EF 35-45%; b. 06/2018 Echo: EF 40-45%.  ? Myalgia   ? Oxygen dependent   ? Peptic ulcer   ? Pulmonary nodules   ? ST elevation (STEMI) myocardial infarction involving left anterior descending coronary artery ([place]) 11/2009  ? a. s/p PCI to the LAD  ? Status post dilation of esophageal narrowing 2000  ? Thickening of esophagus   ? Tinea pedis   ? Wears dentures   ? partial upper  ? ? ?SURGICAL HISTORY: ?Past Surgical History:  ?Procedure Laterality Date  ? Admission  12/20/2012  ? COPD exacerbation.  [place].  ? CARDIAC CATHETERIZATION    ? CARDIAC CATHETERIZATION  02/05/13  ? [place]  ? CARDIAC CATHETERIZATION  10/14  ? [facility] : patent stents with no change in anatomy. EF: 40$  ? CARDIAC CATHETERIZATION Left 11/12/2016  ? Procedure: Left Heart Cath and Coronary Angiography;  Surgeon: Wellington Hampshire, MD;  Location: [place];  Service: Cardiovascular;  Laterality: Left;  ? COLONOSCOPY    ?  COLONOSCOPY WITH PROPOFOL N/A 05/18/2016  ? Procedure: COLONOSCOPY WITH PROPOFOL;  Surgeon: Lucilla Lame, MD;  Location: [facility] ENDOSCOPY;  Service: Endoscopy;  Laterality: N/A;  ? COLONOSCOPY WITH PROPOFOL N/A 03/19/2020  ? Procedure: COLONOSCOPY WITH PROPOFOL;  Surgeon: Lin Landsman, MD;  Location: [facility] ENDOSCOPY;  Service: Gastroenterology;  Laterality: N/A;  ? CORONARY ANGIOPLASTY WITH STENT PLACEMENT  09/2009  ? LAD 3.0 X23 mm Xience DES, RCA: 4.0 X 15 mm Xience DES  ? ELECTROMAGNETIC NAVIGATION  BROCHOSCOPY N/A 10/27/2017  ? Procedure: ELECTROMAGNETIC NAVIGATION BRONCHOSCOPY;  Surgeon: Flora Lipps, MD;  Location: [facility] ORS;  Service: Cardiopulmonary;  Laterality: N/A;  ? ESOPHAGEAL DILATION    ? E

## 2022-05-05 LAB — CEA: CEA: 3.4 ng/mL (ref 0.0–4.7)

## 2022-05-07 ENCOUNTER — Other Ambulatory Visit: Payer: Self-pay

## 2022-05-07 ENCOUNTER — Ambulatory Visit
Admission: RE | Admit: 2022-05-07 | Discharge: 2022-05-07 | Disposition: A | Discharge status: Home / Self Care | Payer: Medicare Other | Source: Ambulatory Visit | Attending: Oncology | Admitting: Oncology

## 2022-05-07 DIAGNOSIS — Z8546 Personal history of malignant neoplasm of prostate: Secondary | ICD-10-CM | POA: Insufficient documentation

## 2022-05-07 DIAGNOSIS — D378 Neoplasm of uncertain behavior of other specified digestive organs: Secondary | ICD-10-CM | POA: Diagnosis present

## 2022-05-07 DIAGNOSIS — J439 Emphysema, unspecified: Secondary | ICD-10-CM | POA: Diagnosis not present

## 2022-05-07 DIAGNOSIS — I2510 Atherosclerotic heart disease of native coronary artery without angina pectoris: Secondary | ICD-10-CM | POA: Insufficient documentation

## 2022-05-07 DIAGNOSIS — I700 Atherosclerosis of aorta: Secondary | ICD-10-CM | POA: Insufficient documentation

## 2022-05-07 DIAGNOSIS — C159 Malignant neoplasm of esophagus, unspecified: Secondary | ICD-10-CM | POA: Insufficient documentation

## 2022-05-07 LAB — GLUCOSE, CAPILLARY: Glucose-Capillary: 107 mg/dL — ABNORMAL HIGH (ref 70–99)

## 2022-05-07 IMAGING — PT NM PET TUM IMG INITIAL (PI) SKULL BASE T - THIGH
7 series · 25 of 25 positions shown · non-contrast
Comparison: PET-CT [DATE]. CT chest [DATE]. CT AP
[DATE] and MRI abdomen [DATE]

CLINICAL DATA: Subsequent treatment strategy for esophageal
neoplasm.

EXAM:
NUCLEAR MEDICINE PET SKULL BASE TO THIGH
TECHNIQUE: 10.24 mCi F-18 FDG was injected intravenously. Full-ring PET imaging
was performed from the skull base to thigh after the radiotracer. CT
data was obtained and used for attenuation correction and anatomic
localization.
Fasting blood glucose: 107 mg/dl

[Series 2: ct slices · axial · 3.8mm · 1.37mm/px · z∈[-974,+0]mm · 6 of 299 slices shown]
[im 1/299]
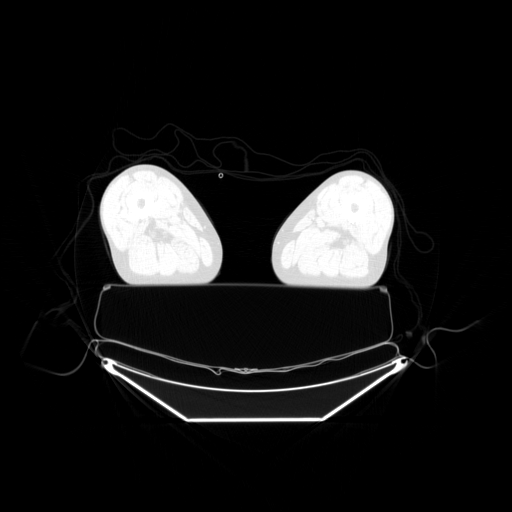
[im 60/299]
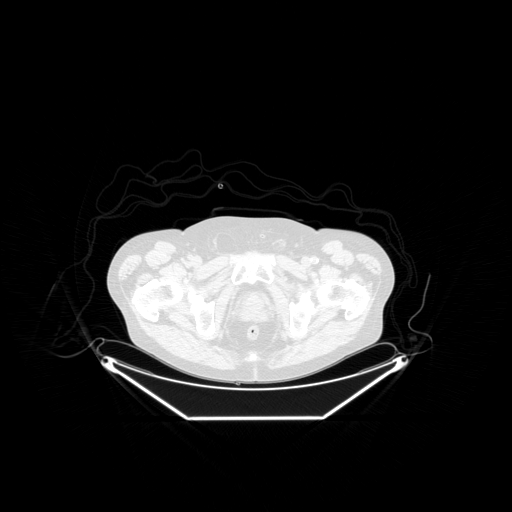
[im 120/299]
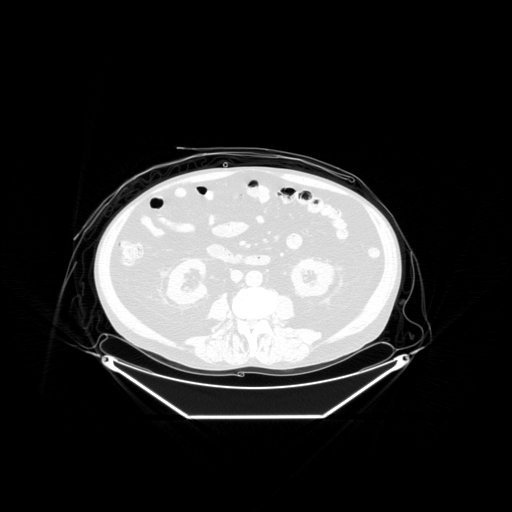
[im 179/299]
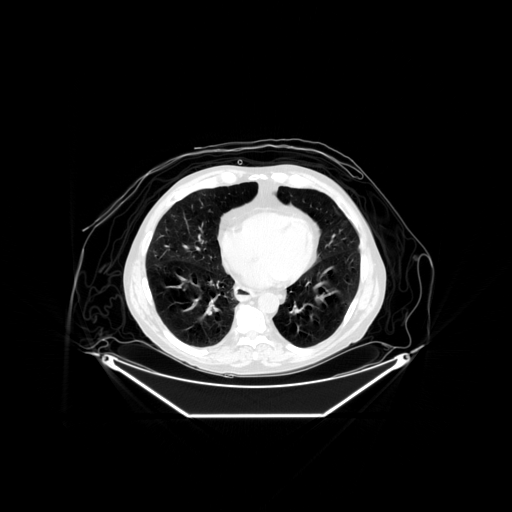
[im 239/299]
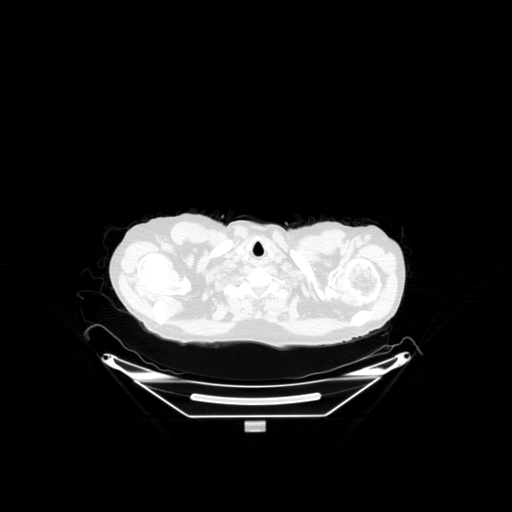
[im 299/299]
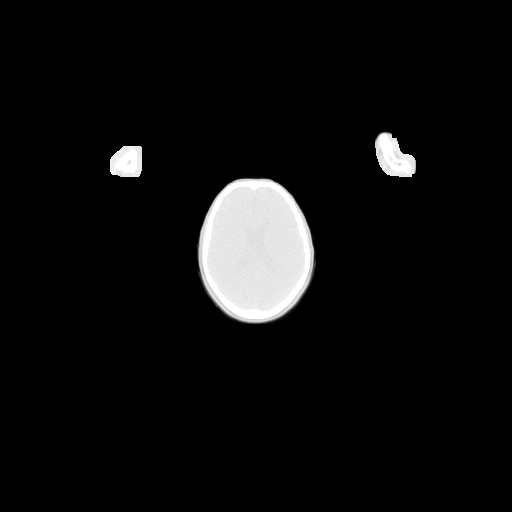

[Series 3: pet ac 3d body · axial · 3.3mm · 5.47mm/px · z∈[-974,+0]mm · 5 of 299 slices shown]
[im 1/299]
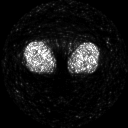
[im 75/299]
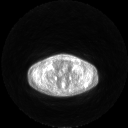
[im 150/299]
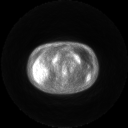
[im 224/299]
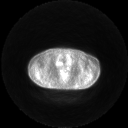
[im 299/299]
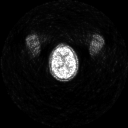

[Series 4: pet nac 3d body · axial · 3.3mm · 5.47mm/px · z∈[-974,+0]mm · 5 of 299 slices shown]
[im 1/299]
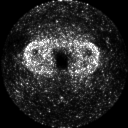
[im 75/299]
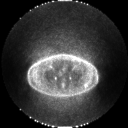
[im 150/299]
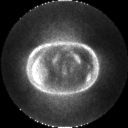
[im 224/299]
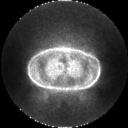
[im 299/299]
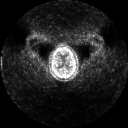

[Series 303: pet axial · axial · 3.3mm · 5.47mm/px · z∈[-974,+0]mm · 5 of 299 slices shown]
[im 1/299]
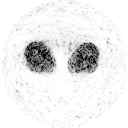
[im 75/299]
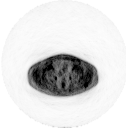
[im 150/299]
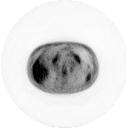
[im 224/299]
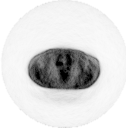
[im 299/299]
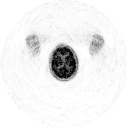

[Series 304: pet sagittal · sagittal · 5.5mm · 7.82mm/px · 2 of 84 slices shown]
[im 1/84]
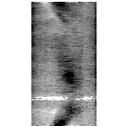
[im 84/84]
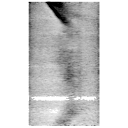

[Series 305: pet coronal · coronal · 5.5mm · 7.82mm/px · 1 of 77 slices shown]
[im 1/77]
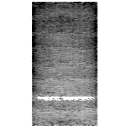

[Series 1139: results mm oncology reading · 1.4mm · 0.62mm/px · 1 of 3 slices shown]
[im 1/3]
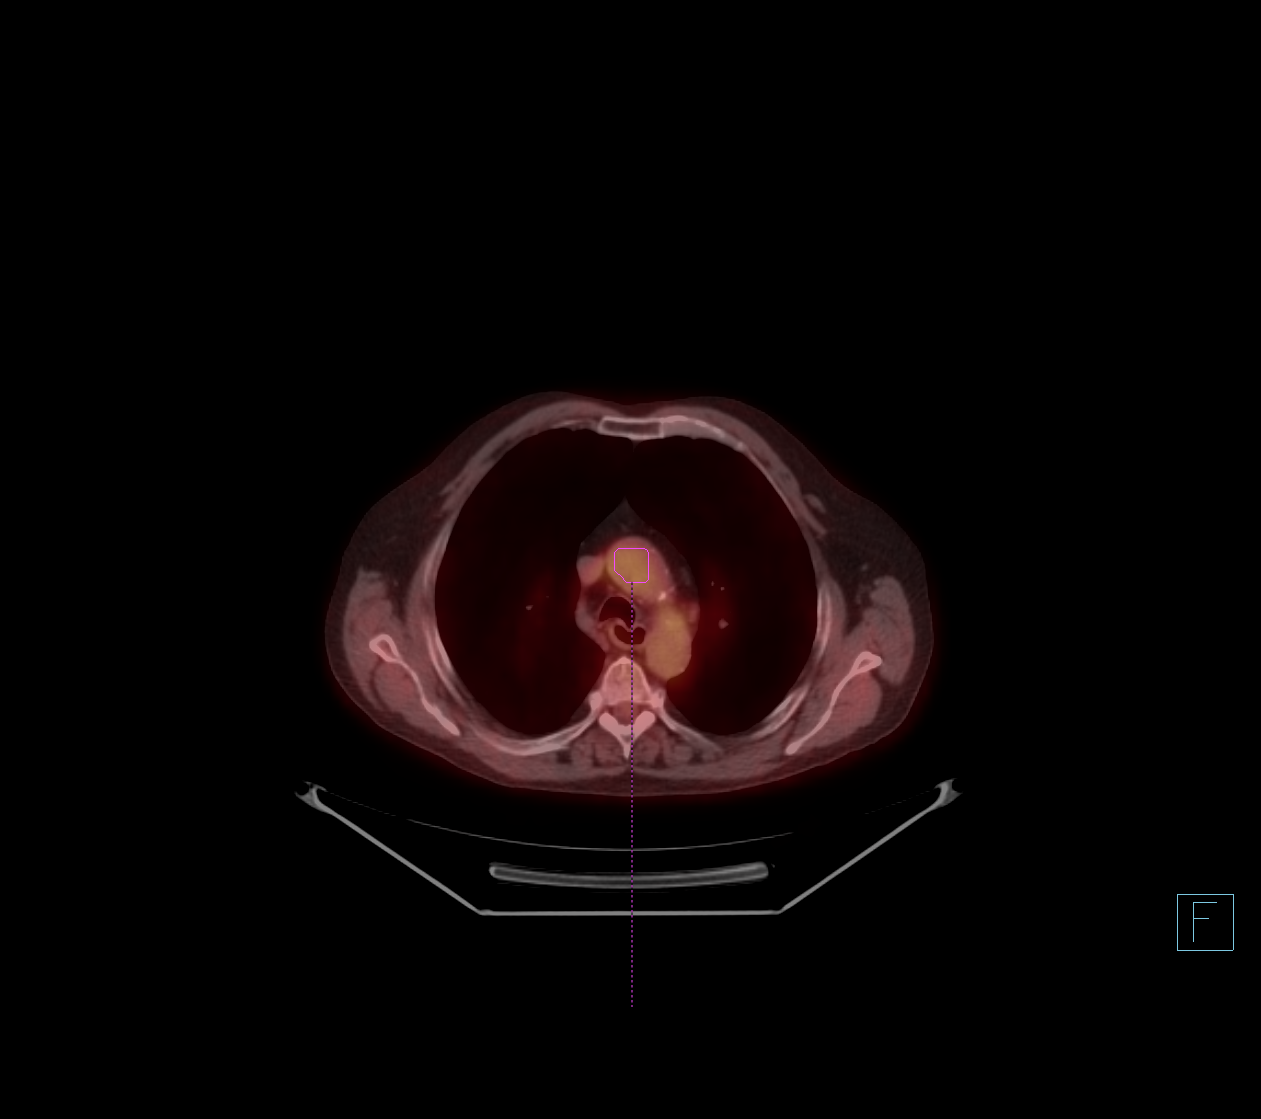

[25 of 25 positions shown; findings below may reference images not displayed]

FINDINGS: Mediastinal blood pool activity: SUV max

Liver activity: SUV max NA

NECK: No hypermetabolic lymph nodes in the neck.

Incidental CT findings: None

CHEST: No tracer avid axillary, supraclavicular, mediastinal, or
hilar lymph nodes.

Mass involving the distal esophagus measures 5.1 x 3.1 by 4.8 cm and
has an SUV max of 12.82, image 133/2. The more proximal portions of
the esophagus appear mildly dilated and patulous. Marked narrowing
of the esophageal lumen is noted at the level of the tumor.

Incidental CT findings:

No tracer avid lung nodules identified at this time. Partially
calcified nodule within the left upper lobe is again noted and is
stable measuring 9 mm, image 84/2. Stable bandlike area of scarring
within the anterior right upper lobe.

Aortic atherosclerosis and coronary artery calcifications. Moderate
emphysema.

ABDOMEN/PELVIS: No abnormal tracer uptake identified within the
liver, pancreas, spleen, or adrenal glands. No tracer avid
abdominopelvic lymph nodes.

Incidental CT findings: Bilateral adrenal nodules are again noted,
previously characterized as benign adenomas. No follow-up
recommended. Penile prosthesis is identified. Bilateral fat
containing inguinal hernias, right greater than left.

SKELETON: No focal hypermetabolic activity to suggest skeletal
metastasis.

Incidental CT findings: none
IMPRESSION: 1. There is intense tracer uptake associated with the distal
esophageal mass just above the level of the GE junction compatible
with primary esophageal neoplasm.
2. No signs of tracer avid nodal metastasis or solid organ
metastasis.
3. Aortic Atherosclerosis ([DQ]-[DQ]) and Emphysema ([DQ]-[DQ]).
4. Coronary artery calcifications.

## 2022-05-07 MED ORDER — FLUDEOXYGLUCOSE F - 18 (FDG) INJECTION
9.4000 | Freq: Once | INTRAVENOUS | Status: AC | PRN
  Administered 2022-05-07: 10.24 via INTRAVENOUS

## 2022-05-10 ENCOUNTER — Telehealth: Payer: Self-pay

## 2022-05-10 ENCOUNTER — Other Ambulatory Visit: Payer: Self-pay

## 2022-05-10 NOTE — Telephone Encounter (Signed)
EUS has been scheduled for 05/20/22 with Dr. Cephas Darby at [facility] and went over all instructions and copy mailed to home address.

## 2022-05-19 ENCOUNTER — Encounter: Payer: Self-pay | Admitting: *Deleted

## 2022-05-20 ENCOUNTER — Ambulatory Visit
Admission: RE | Admit: 2022-05-20 | Discharge: 2022-05-20 | Disposition: A | Discharge status: Home / Self Care | Payer: Medicare Other | Attending: Gastroenterology | Admitting: Gastroenterology

## 2022-05-20 ENCOUNTER — Encounter
Admission: RE | Disposition: A | Discharge status: Home / Self Care | Payer: Self-pay | Source: Home / Self Care | Attending: Gastroenterology

## 2022-05-20 ENCOUNTER — Other Ambulatory Visit: Payer: Self-pay

## 2022-05-20 ENCOUNTER — Encounter: Payer: Self-pay | Admitting: *Deleted

## 2022-05-20 ENCOUNTER — Ambulatory Visit: Payer: Medicare Other | Admitting: Anesthesiology

## 2022-05-20 DIAGNOSIS — I2510 Atherosclerotic heart disease of native coronary artery without angina pectoris: Secondary | ICD-10-CM | POA: Diagnosis not present

## 2022-05-20 DIAGNOSIS — J309 Allergic rhinitis, unspecified: Secondary | ICD-10-CM | POA: Insufficient documentation

## 2022-05-20 DIAGNOSIS — Z8619 Personal history of other infectious and parasitic diseases: Secondary | ICD-10-CM | POA: Insufficient documentation

## 2022-05-20 DIAGNOSIS — C155 Malignant neoplasm of lower third of esophagus: Secondary | ICD-10-CM | POA: Diagnosis not present

## 2022-05-20 DIAGNOSIS — Z87891 Personal history of nicotine dependence: Secondary | ICD-10-CM | POA: Diagnosis not present

## 2022-05-20 DIAGNOSIS — E785 Hyperlipidemia, unspecified: Secondary | ICD-10-CM | POA: Diagnosis not present

## 2022-05-20 DIAGNOSIS — J449 Chronic obstructive pulmonary disease, unspecified: Secondary | ICD-10-CM | POA: Diagnosis not present

## 2022-05-20 DIAGNOSIS — Z8711 Personal history of peptic ulcer disease: Secondary | ICD-10-CM | POA: Insufficient documentation

## 2022-05-20 DIAGNOSIS — F419 Anxiety disorder, unspecified: Secondary | ICD-10-CM | POA: Insufficient documentation

## 2022-05-20 DIAGNOSIS — G4700 Insomnia, unspecified: Secondary | ICD-10-CM | POA: Insufficient documentation

## 2022-05-20 DIAGNOSIS — Z8546 Personal history of malignant neoplasm of prostate: Secondary | ICD-10-CM | POA: Diagnosis not present

## 2022-05-20 DIAGNOSIS — I252 Old myocardial infarction: Secondary | ICD-10-CM | POA: Diagnosis not present

## 2022-05-20 DIAGNOSIS — I110 Hypertensive heart disease with heart failure: Secondary | ICD-10-CM | POA: Insufficient documentation

## 2022-05-20 DIAGNOSIS — F32A Depression, unspecified: Secondary | ICD-10-CM | POA: Insufficient documentation

## 2022-05-20 DIAGNOSIS — I255 Ischemic cardiomyopathy: Secondary | ICD-10-CM | POA: Diagnosis not present

## 2022-05-20 DIAGNOSIS — K219 Gastro-esophageal reflux disease without esophagitis: Secondary | ICD-10-CM | POA: Diagnosis not present

## 2022-05-20 DIAGNOSIS — Z9981 Dependence on supplemental oxygen: Secondary | ICD-10-CM | POA: Insufficient documentation

## 2022-05-20 DIAGNOSIS — Z955 Presence of coronary angioplasty implant and graft: Secondary | ICD-10-CM | POA: Insufficient documentation

## 2022-05-20 DIAGNOSIS — C159 Malignant neoplasm of esophagus, unspecified: Secondary | ICD-10-CM

## 2022-05-20 DIAGNOSIS — I5022 Chronic systolic (congestive) heart failure: Secondary | ICD-10-CM | POA: Insufficient documentation

## 2022-05-20 HISTORY — DX: Other complications of anesthesia, initial encounter: T88.59XA

## 2022-05-20 HISTORY — DX: Family history of other specified conditions: Z84.89

## 2022-05-20 HISTORY — PX: EUS: SHX5427

## 2022-05-20 SURGERY — UPPER ENDOSCOPIC ULTRASOUND (EUS) LINEAR
Anesthesia: General

## 2022-05-20 MED ORDER — LIDOCAINE HCL (CARDIAC) PF 100 MG/5ML IV SOSY
PREFILLED_SYRINGE | INTRAVENOUS | Status: DC | PRN
Start: 2022-05-20 — End: 2022-05-20
  Administered 2022-05-20: 40 mg via INTRAVENOUS

## 2022-05-20 MED ORDER — DEXMEDETOMIDINE (PRECEDEX) IN NS 20 MCG/5ML (4 MCG/ML) IV SYRINGE
PREFILLED_SYRINGE | INTRAVENOUS | Status: DC | PRN
  Administered 2022-05-20: 8 ug via INTRAVENOUS

## 2022-05-20 MED ORDER — SODIUM CHLORIDE 0.9 % IV SOLN: INTRAVENOUS | Status: DC

## 2022-05-20 MED ORDER — PROPOFOL 500 MG/50ML IV EMUL
INTRAVENOUS | Status: DC | PRN
  Administered 2022-05-20: 175 ug/kg/min via INTRAVENOUS

## 2022-05-20 MED ORDER — EPHEDRINE SULFATE-NACL 50-0.9 MG/10ML-% IV SOSY
PREFILLED_SYRINGE | INTRAVENOUS | Status: DC | PRN
  Administered 2022-05-20: 10 mg via INTRAVENOUS

## 2022-05-20 MED ORDER — PROPOFOL 10 MG/ML IV BOLUS
INTRAVENOUS | Status: DC | PRN
  Administered 2022-05-20: 90 mg via INTRAVENOUS

## 2022-05-20 MED ORDER — PROPOFOL 500 MG/50ML IV EMUL
INTRAVENOUS | Status: AC
  Filled 2022-05-20: qty 50

## 2022-05-20 NOTE — Op Note (Signed)
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 Providers:             Lenetta Quaker. Cephas Darby, MD Referring MD:          Renette Butters. Tamala Julian, MD (Referring MD), [name], MD                         (Referring MD) Medicines:             Monitored Anesthesia Care Complications:         No immediate complications. Procedure:             Pre-Anesthesia Assessment:                        - Monitored anesthesia care was determined to be                         medically necessary for this procedure based on                         complex procedure (ERCP, EUS).                        After obtaining informed consent, the endoscope was                         passed under direct vision. Throughout the procedure,                         the patient's blood pressure, pulse, and oxygen                         saturations were monitored continuously. The Endoscope                         was introduced through the mouth, and advanced to the                         esophagus for ultrasound examination. The Endoscope                         was introduced through the mouth, and advanced to the                         second part of duodenum. Findings:      ENDOSCOPIC FINDING: :  A large, fungating mass with no bleeding and no stigmata of recent       bleeding was found in the lower third of the esophagus, 37 to 41 cm from       the incisors with the GEJ junction at  approximately 40 cm. The mass was       partially obstructing and circumferential. Biopsies were taken with a       cold forceps for histology.      The entire examined stomach was endoscopically normal.      The examined duodenum was endoscopically normal.      ENDOSONOGRAPHIC FINDING: :      A hypoechoic mass was found in the lower third of the esophagus. The       radial EUS scope could not be advanced beyond the mass. The mass was       encountered at 37 cm from the incisors. The endosonographic borders were       irregular. There was sonographic evidence suggesting invasion into the       adventitia (Layer 5).      One abnormal lymph node was visualized in the middle paraesophageal       mediastinum with the ultrasound probe located 36 cm from the incisors.       This was 1 cm proximal to the primary tumor. It measured 6 mm by 7 mm in       maximal cross-sectional diameter. The node was oval, hypoechoic and had       well defined margins. Impression:            EGD Impression:                        - Partially obstructing, malignant esophageal tumor                         was found in the lower third of the esophagus.                         Biopsied.                        - Normal stomach.                        - Normal examined duodenum.                        EUS Impression:                        - A mass was found in the lower third of the                         esophagus. A tissue diagnosis was obtained prior to                         this exam. This is of squamous cell carcinoma. This                         was staged at least T3 Nx Mx by endosonographic  criteria. As the scope could not be advanced beyond                         the mass staging could not be completed.                        - One abnormal lymph node was visualized in the middle                         paraesophageal mediastinum. This did not fulfill EUS                          criteria for malignancy and was not sampled. Recommendation:        - Discharge patient to home (ambulatory).                        - Await path results.                        - Return to referring physician as previously                         scheduled.                        - The findings and recommendations were discussed with                         the patient. Attending Participation:      I personally performed the entire procedure. Dr. Lenetta Quaker. Cephas Darby, MD Lenetta Quaker. Riata Ikeda, MD 05/20/2022 2:14:01 PM This report has been signed electronically. Number of Addenda: 0 Note Initiated On: 05/20/2022 11:35 AM Estimated Blood Loss:  Estimated blood loss was minimal.      [facility]

## 2022-05-20 NOTE — Anesthesia Procedure Notes (Signed)
Date/Time: 05/20/2022 12:52 PM Performed by: Doreen Salvage, CRNA Pre-anesthesia Checklist: Patient identified, Emergency Drugs available, Suction available and Patient being monitored Patient Re-evaluated:Patient Re-evaluated prior to induction Oxygen Delivery Method: Simple face mask Induction Type: IV induction Dental Injury: Teeth and Oropharynx as per pre-operative assessment  Comments: Nasal cannula with etCO2 monitoring

## 2022-05-20 NOTE — H&P (Signed)
PRE-PROCEDURE HISTORY AND PHYSICAL   Leslie Sims presents for his scheduled Procedure(s): UPPER ENDOSCOPIC ULTRASOUND (EUS) LINEAR.  The indication for the procedure(s) is esophageal cancer.  There have been no significant recent changes in the patient's medical status.  Past Medical History:  Diagnosis Date   Allergic rhinitis    Anxiety    Back pain    Cancer (HCC)    prostate   CHF (congestive heart failure) ([place])    Colon polyps 08/2012   colonoscopy; multiple colon polyps; repeat colonoscopy in one year.   Complication of anesthesia    COPD (chronic obstructive pulmonary disease) ([place])    a. on home O2.   Coronary artery disease 11/2009   a. late presenting ant MI; b. LHC 100% pLAD s/p PCI/DES, 99% mRCA s/p PCI/DES, EF 35%; c. nuclear stress test 05/13: prior ant/inf infarcts w/o ischemia, EF 43%; d. LHC 02/14: widely patent stents with no other obs dz, EF 40%; e. LHC 11/17: LM nl, mLAD 10%, patent LAD stent, dLAD 20%, p-mRCA 10%, mRCA 40%, patent RCA stent, EF 35-45%    Depression    Dysphagia    Family history of adverse reaction to anesthesia    GERD (gastroesophageal reflux disease)    JMEQASTM(196.2)    Helicobacter pylori (H. pylori)    Hemorrhoids    Hernia    inguinal   HFrEF (heart failure with reduced ejection fraction) ([place])    a. 10/2016 LV gram: EF 35-45%; b. 06/2018 Echo: EF 40-45%. c. 01/2021 Echo: EF 40-45%   Hyperlipidemia    Hypertension    Insomnia    Ischemic cardiomyopathy    a. 10/2016 LV gram: EF 35-45%; b. 06/2018 Echo: EF 40-45%.   Myalgia    Oxygen dependent    Peptic ulcer    Pulmonary nodules    ST elevation (STEMI) myocardial infarction involving left anterior descending coronary artery ([place]) 11/2009   a. s/p PCI to the LAD   Status post dilation of esophageal narrowing 2000   Thickening of esophagus    Tinea pedis    Wears dentures    partial upper    Past Surgical History:  Procedure Laterality Date    coronary stents      Admission  12/20/2012   COPD exacerbation.  [place].   CARDIAC CATHETERIZATION     CARDIAC CATHETERIZATION  02/05/2013   [facility]   CARDIAC CATHETERIZATION  09/19/2013   [facility] : patent stents with no change in anatomy. EF: 40$   CARDIAC CATHETERIZATION Left 11/12/2016   Procedure: Left Heart Cath and Coronary Angiography;  Surgeon: Wellington Hampshire, MD;  Location: [facility];  Service: Cardiovascular;  Laterality: Left;   COLONOSCOPY     COLONOSCOPY WITH PROPOFOL N/A 05/18/2016   Procedure: COLONOSCOPY WITH PROPOFOL;  Surgeon: Lucilla Lame, MD;  Location: [facility] ENDOSCOPY;  Service: Endoscopy;  Laterality: N/A;   COLONOSCOPY WITH PROPOFOL N/A 03/19/2020   Procedure: COLONOSCOPY WITH PROPOFOL;  Surgeon: Lin Landsman, MD;  Location: [facility] ENDOSCOPY;  Service: Gastroenterology;  Laterality: N/A;   CORONARY ANGIOPLASTY WITH STENT PLACEMENT  09/19/2009   LAD 3.0 X23 mm Xience DES, RCA: 4.0 X 15 mm Xience DES   ELECTROMAGNETIC NAVIGATION BROCHOSCOPY N/A 10/27/2017   Procedure: ELECTROMAGNETIC NAVIGATION BRONCHOSCOPY;  Surgeon: Flora Lipps, MD;  Location: [facility] ORS;  Service: Cardiopulmonary;  Laterality: N/A;   ESOPHAGEAL DILATION     ESOPHAGOGASTRODUODENOSCOPY  12/20/2006   ESOPHAGOGASTRODUODENOSCOPY  08/20/2012   ESOPHAGOGASTRODUODENOSCOPY (EGD) WITH PROPOFOL N/A 04/19/2022   Procedure:  ESOPHAGOGASTRODUODENOSCOPY (EGD) WITH PROPOFOL;  Surgeon: [name], MD;  Location: [facility] ENDOSCOPY;  Service: Endoscopy;  Laterality: N/A;   ESOPHAGOGASTRODUODENOSCOPY (EGD) WITH PROPOFOL N/A 04/27/2022   Procedure: ESOPHAGOGASTRODUODENOSCOPY (EGD) WITH PROPOFOL;  Surgeon: [name], MD;  Location: [facility] ENDOSCOPY;  Service: Endoscopy;  Laterality: N/A;   HERNIA REPAIR  07/20/2012   L inguinal hernia repair   PENILE PROSTHESIS IMPLANT      Allergies Allergies  Allergen Reactions   Prozac [Fluoxetine Hcl] Shortness Of Breath   Hydroxyzine    Effexor Xr [Venlafaxine Hcl Er] Other  (See Comments)    "Makes me feel funny"   Wellbutrin [Bupropion] Other (See Comments)    "Makes me feel funny"    Medications AeroChamber MV, LORazepam, OLANZapine, Oxygen-Helium, albuterol, amitriptyline, aspirin, atorvastatin, azithromycin, budesonide, busPIRone, esomeprazole, fluticasone, furosemide, levalbuterol, loratadine, losartan, mirtazapine, multivitamin, nitroGLYCERIN, and sucralfate  Physical Examination  Body mass index is 25.83 kg/m. BP 127/73   Pulse 89   Temp (!) 97.3 F (36.3 C) (Temporal)   Resp 20   Ht '5\' 10"'$  (1.778 m)   Wt 81.6 kg   SpO2 97%   BMI 25.83 kg/m  General:   Alert,  pleasant and cooperative in NAD Head:  Normocephalic and atraumatic. Neck:  Supple; no masses or thyromegaly. Lungs:  No audible wheezes Heart:  Regular rate Abdomen:  Soft, nontender and nondistended.  Neurologic:  Alert and  oriented;  grossly normal neurologically.  ASSESSMENT AND PLAN  Mr. Prochnow has been evaluated and deemed appropriate to undergo the planned Procedure(s): UPPER ENDOSCOPIC ULTRASOUND (EUS) LINEAR.

## 2022-05-20 NOTE — Anesthesia Preprocedure Evaluation (Addendum)
Anesthesia Evaluation  Patient identified by MRN, date of birth, ID band Patient awake    Reviewed: Allergy & Precautions, NPO status , Patient's Chart, lab work & pertinent test results  History of Anesthesia Complications Negative for: history of anesthetic complications  Airway Mallampati: III  TM Distance: <3 FB Neck ROM: full    Dental  (+) Chipped, Poor Dentition, Missing   Pulmonary shortness of breath and with exertion, COPD,  COPD inhaler and oxygen dependent, former smoker,    Pulmonary exam normal        Cardiovascular Exercise Tolerance: Poor hypertension, (-) angina+ CAD, + Past MI, + Cardiac Stents and +CHF  Normal cardiovascular exam     Neuro/Psych  Headaches, PSYCHIATRIC DISORDERS    GI/Hepatic Neg liver ROS, PUD, GERD  Controlled,  Endo/Other  negative endocrine ROS  Renal/GU negative Renal ROS  negative genitourinary   Musculoskeletal   Abdominal   Peds  Hematology negative hematology ROS (+)   Anesthesia Other Findings Past Medical History: No date: Allergic rhinitis No date: Anxiety No date: Back pain No date: Cancer [facility])     Comment:  prostate No date: CHF (congestive heart failure) ([place]) 08/2012: Colon polyps     Comment:  colonoscopy; multiple colon polyps; repeat colonoscopy               in one year. No date: Complication of anesthesia No date: COPD (chronic obstructive pulmonary disease) ([place])     Comment:  a. on home O2. 11/2009: Coronary artery disease     Comment:  a. late presenting ant MI; b. LHC 100% pLAD s/p PCI/DES,              99% mRCA s/p PCI/DES, EF 35%; c. nuclear stress test               05/13: prior ant/inf infarcts w/o ischemia, EF 43%; d.               LHC 02/14: widely patent stents with no other obs dz, EF               40%; e. LHC 11/17: LM nl, mLAD 10%, patent LAD stent,               dLAD 20%, p-mRCA 10%, mRCA 40%, patent RCA stent, EF                35-45%  No date: Depression No date: Dysphagia No date: Family history of adverse reaction to anesthesia No date: GERD (gastroesophageal reflux disease) No date: WUJWJXBJ(478.2) No date: Helicobacter pylori (H. pylori) No date: Hemorrhoids No date: Hernia     Comment:  inguinal No date: HFrEF (heart failure with reduced ejection fraction) ([place])     Comment:  a. 10/2016 LV gram: EF 35-45%; b. 06/2018 Echo: EF               40-45%. c. 01/2021 Echo: EF 40-45% No date: Hyperlipidemia No date: Hypertension No date: Insomnia No date: Ischemic cardiomyopathy     Comment:  a. 10/2016 LV gram: EF 35-45%; b. 06/2018 Echo: EF               40-45%. No date: Myalgia No date: Oxygen dependent No date: Peptic ulcer No date: Pulmonary nodules 11/2009: ST elevation (STEMI) myocardial infarction involving left  anterior descending coronary artery (HCC)     Comment:  a. s/p PCI to the LAD 2000: Status post dilation of esophageal narrowing No date: Thickening of  esophagus No date: Tinea pedis No date: Wears dentures     Comment:  partial upper  Past Surgical History: No date:  coronary stents 12/20/2012: Admission     Comment:  COPD exacerbation.  [place]. No date: CARDIAC CATHETERIZATION 02/05/2013: CARDIAC CATHETERIZATION     Comment:  [place] 09/19/2013: CARDIAC CATHETERIZATION     Comment:  [facility] : patent stents with no change in anatomy. EF: 40$ 11/12/2016: CARDIAC CATHETERIZATION; Left     Comment:  Procedure: Left Heart Cath and Coronary Angiography;                Surgeon: Wellington Hampshire, MD;  Location: [facility]               CV LAB;  Service: Cardiovascular;  Laterality: Left; No date: COLONOSCOPY 05/18/2016: COLONOSCOPY WITH PROPOFOL; N/A     Comment:  Procedure: COLONOSCOPY WITH PROPOFOL;  Surgeon: Lucilla Lame, MD;  Location: [facility] ENDOSCOPY;  Service: Endoscopy;              Laterality: N/A; 03/19/2020: COLONOSCOPY WITH PROPOFOL; N/A     Comment:  Procedure:  COLONOSCOPY WITH PROPOFOL;  Surgeon: Lin Landsman, MD;  Location: [facility] ENDOSCOPY;  Service:               Gastroenterology;  Laterality: N/A; 09/19/2009: CORONARY ANGIOPLASTY WITH STENT PLACEMENT     Comment:  LAD 3.0 X23 mm Xience DES, RCA: 4.0 X 15 mm Xience DES 10/27/2017: ELECTROMAGNETIC NAVIGATION BROCHOSCOPY; N/A     Comment:  Procedure: ELECTROMAGNETIC NAVIGATION BRONCHOSCOPY;                Surgeon: Flora Lipps, MD;  Location: [facility] ORS;  Service:              Cardiopulmonary;  Laterality: N/A; No date: ESOPHAGEAL DILATION 12/20/2006: ESOPHAGOGASTRODUODENOSCOPY 08/20/2012: ESOPHAGOGASTRODUODENOSCOPY 04/19/2022: ESOPHAGOGASTRODUODENOSCOPY (EGD) WITH PROPOFOL; N/A     Comment:  Procedure: ESOPHAGOGASTRODUODENOSCOPY (EGD) WITH               PROPOFOL;  Surgeon: [name], MD;  Location:               [facility] ENDOSCOPY;  Service: Endoscopy;  Laterality: N/A; 04/27/2022: ESOPHAGOGASTRODUODENOSCOPY (EGD) WITH PROPOFOL; N/A     Comment:  Procedure: ESOPHAGOGASTRODUODENOSCOPY (EGD) WITH               PROPOFOL;  Surgeon: [name], MD;  Location:               [facility] ENDOSCOPY;  Service: Endoscopy;  Laterality: N/A; 07/20/2012: HERNIA REPAIR     Comment:  L inguinal hernia repair No date: PENILE PROSTHESIS IMPLANT     Reproductive/Obstetrics negative OB ROS                            Anesthesia Physical Anesthesia Plan  ASA: 4  Anesthesia Plan: General   Post-op Pain Management:    Induction: Intravenous  PONV Risk Score and Plan: Propofol infusion and TIVA  Airway Management Planned: Natural Airway and Nasal Cannula  Additional Equipment:   Intra-op Plan:   Post-operative Plan:   Informed Consent: I have reviewed the patients History and Physical, chart, labs and discussed the procedure including the risks, benefits and alternatives for the proposed anesthesia with the  patient or authorized representative who  has indicated his/her understanding and acceptance.     Dental Advisory Given  Plan Discussed with: Anesthesiologist, CRNA and Surgeon  Anesthesia Plan Comments: (Patient consented for risks of anesthesia including but not limited to:  - adverse reactions to medications - risk of airway placement if required - damage to eyes, teeth, lips or other oral mucosa - nerve damage due to positioning  - sore throat or hoarseness - Damage to heart, brain, nerves, lungs, other parts of body or loss of life  Patient voiced understanding.)        Anesthesia Quick Evaluation

## 2022-05-20 NOTE — Transfer of Care (Signed)
  Immediate Anesthesia Transfer of Care Note  Patient: Leslie Sims  Procedure(s) Performed: Procedure(s) with comments: UPPER ENDOSCOPIC ULTRASOUND (EUS) LINEAR (N/A) - [facility]  Patient Location: PACU and Endoscopy Unit  Anesthesia Type:General  Level of Consciousness: sedated  Airway & Oxygen Therapy: Patient Spontanous Breathing and Patient connected to nasal cannula oxygen  Post-op Assessment: Report given to RN and Post -op Vital signs reviewed and stable  Post vital signs: Reviewed and stable  Last Vitals:  Vitals:   05/20/22 1227 05/20/22 1322  BP: 127/73 127/69  Pulse: 89 76  Resp: 20 13  Temp: (!) 36.3 C   SpO2: 69% 485%    Complications: No apparent anesthesia complications

## 2022-05-21 ENCOUNTER — Encounter: Payer: Self-pay | Admitting: Gastroenterology

## 2022-05-21 LAB — SURGICAL PATHOLOGY

## 2022-05-21 NOTE — Anesthesia Postprocedure Evaluation (Signed)
Anesthesia Post Note  Patient: Leslie Sims  Procedure(s) Performed: UPPER ENDOSCOPIC ULTRASOUND (EUS) LINEAR  Patient location during evaluation: Endoscopy Anesthesia Type: General Level of consciousness: awake and alert Pain management: pain level controlled Vital Signs Assessment: post-procedure vital signs reviewed and stable Respiratory status: spontaneous breathing, nonlabored ventilation, respiratory function stable and patient connected to nasal cannula oxygen Cardiovascular status: blood pressure returned to baseline and stable Postop Assessment: no apparent nausea or vomiting Anesthetic complications: no   No notable events documented.   Last Vitals:  Vitals:   05/20/22 1342 05/20/22 1352  BP: 131/76 133/78  Pulse: 88 78  Resp: 17 13  Temp:    SpO2: 100% 99%    Last Pain:  Vitals:   05/20/22 1352  TempSrc:   PainSc: 5                  Precious Haws Seibert Keeter

## 2022-05-24 ENCOUNTER — Telehealth: Payer: Self-pay

## 2022-05-24 ENCOUNTER — Telehealth: Payer: Self-pay | Admitting: Pulmonary Disease

## 2022-05-24 ENCOUNTER — Inpatient Hospital Stay: Payer: Medicare Other

## 2022-05-24 ENCOUNTER — Other Ambulatory Visit: Payer: Self-pay

## 2022-05-24 DIAGNOSIS — K219 Gastro-esophageal reflux disease without esophagitis: Secondary | ICD-10-CM | POA: Insufficient documentation

## 2022-05-24 DIAGNOSIS — Z5111 Encounter for antineoplastic chemotherapy: Secondary | ICD-10-CM | POA: Insufficient documentation

## 2022-05-24 DIAGNOSIS — E785 Hyperlipidemia, unspecified: Secondary | ICD-10-CM | POA: Insufficient documentation

## 2022-05-24 DIAGNOSIS — Z510 Encounter for antineoplastic radiation therapy: Secondary | ICD-10-CM | POA: Diagnosis present

## 2022-05-24 DIAGNOSIS — Z8546 Personal history of malignant neoplasm of prostate: Secondary | ICD-10-CM | POA: Insufficient documentation

## 2022-05-24 DIAGNOSIS — I252 Old myocardial infarction: Secondary | ICD-10-CM | POA: Insufficient documentation

## 2022-05-24 DIAGNOSIS — I110 Hypertensive heart disease with heart failure: Secondary | ICD-10-CM | POA: Insufficient documentation

## 2022-05-24 DIAGNOSIS — C159 Malignant neoplasm of esophagus, unspecified: Secondary | ICD-10-CM | POA: Insufficient documentation

## 2022-05-24 DIAGNOSIS — Z79899 Other long term (current) drug therapy: Secondary | ICD-10-CM | POA: Insufficient documentation

## 2022-05-24 DIAGNOSIS — R972 Elevated prostate specific antigen [PSA]: Secondary | ICD-10-CM

## 2022-05-24 DIAGNOSIS — J449 Chronic obstructive pulmonary disease, unspecified: Secondary | ICD-10-CM | POA: Insufficient documentation

## 2022-05-24 DIAGNOSIS — C155 Malignant neoplasm of lower third of esophagus: Secondary | ICD-10-CM | POA: Diagnosis present

## 2022-05-24 LAB — PSA: Prostatic Specific Antigen: 0.96 ng/mL (ref 0.00–4.00)

## 2022-05-24 MED ORDER — BUDESONIDE 0.5 MG/2ML IN SUSP
0.5000 mg | Freq: Two times a day (BID) | RESPIRATORY_TRACT | 12 refills | Status: DC
Start: 2022-05-24 — End: 2023-06-07

## 2022-05-24 MED ORDER — BUDESONIDE 0.5 MG/2ML IN SUSP
0.5000 mg | Freq: Two times a day (BID) | RESPIRATORY_TRACT | 12 refills | Status: DC
Start: 2022-05-24 — End: 2022-05-24

## 2022-05-24 NOTE — Telephone Encounter (Signed)
Please schedule patient for MD only on 05/31/22. Please notify patient of appt.

## 2022-05-24 NOTE — Telephone Encounter (Signed)
Pulmicort sent to [facility] with dx code of J44.9.  Patient is aware and voiced his understanding.  Nothing further needed.

## 2022-05-24 NOTE — Telephone Encounter (Signed)
-----   Message from [name], MD sent at 05/22/2022  2:32 PM EDT ----- I can see him on 6/12, MD only to go over treatment plan. He needs concurrent chemotherapy and radiation.

## 2022-05-24 NOTE — Telephone Encounter (Signed)
Pulmicort sent with corrected dx code.  Patient is aware and voiced his understanding.  Nothing further needed.

## 2022-05-26 ENCOUNTER — Inpatient Hospital Stay (HOSPITAL_BASED_OUTPATIENT_CLINIC_OR_DEPARTMENT_OTHER): Payer: Medicare Other | Admitting: Hospice and Palliative Medicine

## 2022-05-26 DIAGNOSIS — C159 Malignant neoplasm of esophagus, unspecified: Secondary | ICD-10-CM

## 2022-05-26 NOTE — Progress Notes (Signed)
Multidisciplinary Oncology Council Documentation  Viliami Jonnatan Hanners was presented by our [facility] on 05/26/2022, which included representatives from:  Palliative Care Dietitian  Physical/Occupational Therapist Nurse Navigator Genetics Speech Therapist Social work Survivorship RN Financial Navigator Research RN   B[place]ley currently presents with history of esophageal cancer  We reviewed previous medical and familial history, history of present illness, and recent lab results along with all available histopathologic and imaging studies. The [facility] considered available treatment options and made the following recommendations/referrals:  SLP, nutrition  The MOC is a meeting of clinicians from various specialty areas who evaluate and discuss patients for whom a multidisciplinary approach is being considered. Final determinations in the plan of care are those of the provider(s).   Today's extended care, comprehensive team conference, Lane was not present for the discussion and was not examined.

## 2022-05-31 ENCOUNTER — Inpatient Hospital Stay (HOSPITAL_BASED_OUTPATIENT_CLINIC_OR_DEPARTMENT_OTHER): Payer: Medicare Other | Admitting: Oncology

## 2022-05-31 ENCOUNTER — Ambulatory Visit
Admission: RE | Admit: 2022-05-31 | Discharge: 2022-05-31 | Disposition: A | Discharge status: Home / Self Care | Payer: Medicare Other | Source: Ambulatory Visit | Attending: Radiation Oncology | Admitting: Radiation Oncology

## 2022-05-31 ENCOUNTER — Encounter: Payer: Self-pay | Admitting: Oncology

## 2022-05-31 VITALS — BP 134/82 | HR 85 | Temp 96.6°F | Ht 70.0 in | Wt 178.0 lb

## 2022-05-31 DIAGNOSIS — R1319 Other dysphagia: Secondary | ICD-10-CM

## 2022-05-31 DIAGNOSIS — R634 Abnormal weight loss: Secondary | ICD-10-CM | POA: Insufficient documentation

## 2022-05-31 DIAGNOSIS — C159 Malignant neoplasm of esophagus, unspecified: Secondary | ICD-10-CM | POA: Diagnosis not present

## 2022-05-31 DIAGNOSIS — Z510 Encounter for antineoplastic radiation therapy: Secondary | ICD-10-CM | POA: Diagnosis not present

## 2022-05-31 DIAGNOSIS — C61 Malignant neoplasm of prostate: Secondary | ICD-10-CM | POA: Diagnosis not present

## 2022-05-31 DIAGNOSIS — Z5111 Encounter for antineoplastic chemotherapy: Secondary | ICD-10-CM | POA: Insufficient documentation

## 2022-05-31 DIAGNOSIS — R1310 Dysphagia, unspecified: Secondary | ICD-10-CM | POA: Insufficient documentation

## 2022-05-31 DIAGNOSIS — C155 Malignant neoplasm of lower third of esophagus: Secondary | ICD-10-CM

## 2022-05-31 DIAGNOSIS — K219 Gastro-esophageal reflux disease without esophagitis: Secondary | ICD-10-CM

## 2022-05-31 DIAGNOSIS — Z7189 Other specified counseling: Secondary | ICD-10-CM

## 2022-05-31 MED ORDER — PROCHLORPERAZINE MALEATE 10 MG PO TABS: 10.0000 mg | ORAL_TABLET | Freq: Four times a day (QID) | ORAL | 1 refills | Status: DC | PRN

## 2022-05-31 MED ORDER — ONDANSETRON HCL 8 MG PO TABS: 8.0000 mg | ORAL_TABLET | Freq: Two times a day (BID) | ORAL | 1 refills | Status: DC | PRN

## 2022-05-31 MED ORDER — LIDOCAINE-PRILOCAINE 2.5-2.5 % EX CREA: TOPICAL_CREAM | CUTANEOUS | 3 refills | Status: DC

## 2022-05-31 NOTE — Assessment & Plan Note (Signed)
Continue Nexium 

## 2022-05-31 NOTE — Progress Notes (Signed)
Hematology/Oncology Progress note [phone] [phone]     Clinic Day:  05/31/2022   Referring physician: [name], MD  ASSESSMENT & PLAN:   Assessment & Plan: Primary esophageal squamous cell carcinoma (Leslie Sims) Image and pathology findings were reviewed and discussed with patient I recommend concurrent chemotherapy carboplatin/Taxol weekly with radiation. The diagnosis and care plan were discussed with patient in detail.  Chemotherapy education was provided.  We had discussed the composition of chemotherapy regimen, length of chemo cycle, duration of treatment and the time to assess response to treatment.    I explained to the patient the risks and benefits of chemotherapy including all but not limited to hair loss, mouth sore, nausea, vomiting, diarrhea, low blood counts, bleeding, neuropathy and risk of life threatening infection and even death, secondary malignancy etc.  Patient voices understanding and willing to proceed chemotherapy.  Patient will see radiation oncology today. # Chemotherapy education;. Antiemetics-Zofran and Compazine;  sent to pharmacy    Goals of care, counseling/discussion Discussed with patient and family  Prostate cancer (Leslie Sims) Gleason score 3+4, status post definitive radiation.-Continue follow-up with urology Dr. Diamantina Providence.   PSA is stable.  Weight loss Follow-up with nutritionist.  Recommend nutrition supplementation.  Dysphagia Discussed about option of PEG tube placement.   GERD Continue Nexium    The patient understands the plans discussed today and is in agreement with them.  He knows to contact our office if he develops concerns prior to his next appointment.  Follow-up to be determined.  Plan to start first cycle of chemotherapy when he starts with radiation.  Leslie Server, MD  No orders of the defined types were placed in this encounter.     CHIEF COMPLAINT:  Chief complaints: follow up for esophageal  carcinoma treatments.  History of prostate cancer.  HISTORY OF PRESENT ILLNESS:   Oncology History  Prostate cancer (Leslie Sims)  10/11/2020 Initial Diagnosis   Prostate cancer (Leslie Sims)   05/04/2022 Cancer Staging   Staging form: Prostate, AJCC 8th Edition - Clinical: cT2c, cN0, cM0, Grade Group: 2 - Signed by Leslie Server, MD on 05/04/2022 Gleason score: 7 Histologic grading system: 5 grade system   Primary esophageal squamous cell carcinoma (Leslie Sims)  04/19/2022 Procedure   04/19/2022 EGD by Leslie Sims showed partially obstructive likely malignant esophageal tumor was found at [facility].  Biopsied.  Normal stomach and normal examined duodenum. Biopsy pathology showed fragments of the squamous mucosa with high-grade dysplastic.  Scant gastric mucosa present with reactive changes and no dysplastic or significant intestinal metaplastic.  Fragments of fibrillar purulent ulcer diabetes also present.  04/27/2022, repeat EGD and a biopsy of GE Sims partially obstructed ulcerating mass. Repeat biopsy pathology showed superficial and disaggregated fragments of squamous mucosa with at least severe dysplastic/squamous cell carcinoma in situ.  Definite invasion cannot be identified.  In part secondary to specimen fragmentation and tangential sectioning.    04/27/2022 Imaging   MRI abdomen MRCP with and without contrast showed bilateral benign adrenal adenoma.  Small hepatic hemangioma and small right hepatic parenchymal vascular anomalies. Small hiatal hernia.  Colonic diverticulosis.   05/04/2022 Initial Diagnosis   Primary esophageal squamous cell carcinoma  + Stomach discomfort for 4 months, dysphagia for both solids and liquids at times. + Weight loss   05/04/2022 Cancer Staging   Staging form: Esophagus - Squamous Cell Carcinoma, AJCC 8th Edition - Clinical: Stage II (cT3, cN0, cM0, L: Lower) - Signed by Leslie Server, MD on 05/31/2022 Stage prefix: Initial diagnosis Total positive  nodes: 0   05/09/2022 Imaging    PET scan showed intense tracer uptake associated with the distal esophageal mass just above the level of the GE Sims compatible with primary esophageal neoplasm. No signs of tracer avid nodal metastasis or solid organ metastasis.   05/20/2022 Procedure   EUS by LeslieSpaete.  T3,Nx 05/20/2022, esophagus mass biopsy showed invasive moderately differentiated squamous cell carcinoma, with basaloid features and minimal keratinization.  Background squamous cell carcinoma in situ.   06/03/2022 -  Chemotherapy   Patient is on Treatment Plan :  ESOPHAGUS Carboplatin/PACLitaxel weekly x 6 weeks with XRT         Patient has multiple comorbidities including COPD/chronic respiratory failure, history of STEMI/combined systolic and diastolic CHF [last LVEF 40 to 45%], lung nodules previously avid on PET scan, biopsy negative and was felt to be calcified granulomas.-Pulmonology Leslie Sims Patient lives by himself.  Independent with ADL and some of the iADL  INTERVAL HISTORY:  [name] is here today for repeat clinical assessment. He denies fevers or chills. He denies pain. His appetite is fair His weight decreased 4 pounds since last visit. + Esophageal dysphagia, with solids.  He has no difficulty with pured food and liquids.  REVIEW OF SYSTEMS:  Review of Systems  Constitutional:  Positive for appetite change, fatigue and unexpected weight change. Negative for chills and fever.  HENT:   Negative for hearing loss and voice change.   Eyes:  Negative for eye problems and icterus.  Respiratory:  Positive for shortness of breath. Negative for chest tightness and cough.        Chronic respiratory failure on oxygen  Cardiovascular:  Negative for chest pain and leg swelling.  Gastrointestinal:  Negative for abdominal distention and abdominal pain.       Dysphagia  Endocrine: Negative for hot flashes.  Genitourinary:  Negative for difficulty urinating, dysuria and frequency.   Musculoskeletal:  Negative for  arthralgias.  Skin:  Negative for itching and rash.  Neurological:  Negative for light-headedness and numbness.  Hematological:  Negative for adenopathy. Does not bruise/bleed easily.  Psychiatric/Behavioral:  Negative for confusion.      VITALS:  Blood pressure 134/82, pulse 85, temperature (!) 96.6 F (35.9 C), temperature source Tympanic, height '5\' 10"'$  (1.778 m), weight 178 lb (80.7 kg).  Wt Readings from Last 3 Encounters:  05/31/22 178 lb (80.7 kg)  05/20/22 180 lb (81.6 kg)  05/04/22 182 lb 1.6 oz (82.6 kg)    Body mass index is 25.54 kg/m.  Performance status (ECOG): 2 - Symptomatic, <50% confined to bed  PHYSICAL EXAM:  Physical Exam Constitutional:      General: He is not in acute distress.    Appearance: He is obese. He is not diaphoretic.  HENT:     Head: Normocephalic and atraumatic.     Nose: Nose normal.     Mouth/Throat:     Pharynx: No oropharyngeal exudate.  Eyes:     General: No scleral icterus.    Pupils: Pupils are equal, round, and reactive to light.  Cardiovascular:     Rate and Rhythm: Normal rate.     Heart sounds: No murmur heard. Pulmonary:     Effort: Pulmonary effort is normal. No respiratory distress.     Comments: Patient is on nasal cannula oxygen Abdominal:     General: There is no distension.     Palpations: Abdomen is soft.     Tenderness: There is no abdominal tenderness.  Musculoskeletal:  General: Normal range of motion.     Cervical back: Normal range of motion and neck supple.  Skin:    General: Skin is warm and dry.     Findings: No erythema.  Neurological:     Mental Status: He is alert and oriented to person, place, and time.     Cranial Nerves: No cranial nerve deficit.     Motor: No abnormal muscle tone.     Coordination: Coordination normal.  Psychiatric:        Mood and Affect: Mood and affect normal.      LABS:      Latest Ref Rng & Units 05/04/2022   12:14 PM 02/28/2022    2:29 PM 12/21/2021    3:46 PM   CBC  WBC 4.0 - 10.5 K/uL 9.5  9.8  7.4   Hemoglobin 13.0 - 17.0 g/dL 13.6  12.9  12.8   Hematocrit 39.0 - 52.0 % 42.9  40.9  41.1   Platelets 150 - 400 K/uL 282  236  279       Latest Ref Rng & Units 05/04/2022   12:14 PM 02/28/2022    2:29 PM 12/21/2021    3:46 PM  CMP  Glucose 70 - 99 mg/dL 104  179  119   BUN 8 - 23 mg/dL '8  19  10   '$ Creatinine 0.61 - 1.24 mg/dL 0.87  1.00  0.91   Sodium 135 - 145 mmol/L 141  142  139   Potassium 3.5 - 5.1 mmol/L 3.9  3.4  3.7   Chloride 98 - 111 mmol/L 99  99  99   CO2 22 - 32 mmol/L '31  30  31   '$ Calcium 8.9 - 10.3 mg/dL 9.1  9.1  9.1   Total Protein 6.5 - 8.1 g/dL 7.7   6.9   Total Bilirubin 0.3 - 1.2 mg/dL 0.7   0.6   Alkaline Phos 38 - 126 U/L 92   82   AST 15 - 41 U/L 18   26   ALT 0 - 44 U/L 20   33      Lab Results  Component Value Date   CEA1 3.4 05/04/2022   Lab Results  Component Value Date   PSA1 3.3 07/27/2017   STUDIES:  NM PET Image Initial (PI) Skull Base To Thigh (F-18 FDG)  Result Date: 05/09/2022 CLINICAL DATA:  Subsequent treatment strategy for esophageal neoplasm. EXAM: NUCLEAR MEDICINE PET SKULL BASE TO THIGH TECHNIQUE: 10.24 mCi F-18 FDG was injected intravenously. Full-ring PET imaging was performed from the skull base to thigh after the radiotracer. CT data was obtained and used for attenuation correction and anatomic localization. Fasting blood glucose: 107 mg/dl COMPARISON:  PET-CT 10/13/2017. CT chest 01/21/2021. CT AP 12/21/2021 and MRI abdomen 04/26/2022 FINDINGS: Mediastinal blood pool activity: SUV max 2.24 Liver activity: SUV max NA NECK: No hypermetabolic lymph nodes in the neck. Incidental CT findings: None CHEST: No tracer avid axillary, supraclavicular, mediastinal, or hilar lymph nodes. Mass involving the distal esophagus measures 5.1 x 3.1 by 4.8 cm and has an SUV max of 12.82, image 133/2. The more proximal portions of the esophagus appear mildly dilated and patulous. Marked narrowing of the esophageal  lumen is noted at the level of the tumor. Incidental CT findings: No tracer avid lung nodules identified at this time. Partially calcified nodule within the left upper lobe is again noted and is stable measuring 9 mm, image 84/2. Stable bandlike area of scarring  within the anterior right upper lobe. Aortic atherosclerosis and coronary artery calcifications. Moderate emphysema. ABDOMEN/PELVIS: No abnormal tracer uptake identified within the liver, pancreas, spleen, or adrenal glands. No tracer avid abdominopelvic lymph nodes. Incidental CT findings: Bilateral adrenal nodules are again noted, previously characterized as benign adenomas. No follow-up recommended. Penile prosthesis is identified. Bilateral fat containing inguinal hernias, right greater than left. SKELETON: No focal hypermetabolic activity to suggest skeletal metastasis. Incidental CT findings: none IMPRESSION: 1. There is intense tracer uptake associated with the distal esophageal mass just above the level of the GE Sims compatible with primary esophageal neoplasm. 2. No signs of tracer avid nodal metastasis or solid organ metastasis. 3. Aortic Atherosclerosis (ICD10-I70.0) and Emphysema (ICD10-J43.9). 4. Coronary artery calcifications. Electronically Signed   By: Kerby Moors M.D.   On: 05/09/2022 16:41      HISTORY:   Past Medical History:  Diagnosis Date   Allergic rhinitis    Anxiety    Back pain    Cancer (HCC)    prostate   CHF (congestive heart failure) ([place])    Colon polyps 08/2012   colonoscopy; multiple colon polyps; repeat colonoscopy in one year.   Complication of anesthesia    COPD (chronic obstructive pulmonary disease) ([place])    a. on home O2.   Coronary artery disease 11/2009   a. late presenting ant MI; b. LHC 100% pLAD s/p PCI/DES, 99% mRCA s/p PCI/DES, EF 35%; c. nuclear stress test 05/13: prior ant/inf infarcts w/o ischemia, EF 43%; d. LHC 02/14: widely patent stents with no other obs dz, EF 40%; e. LHC 11/17:  LM nl, mLAD 10%, patent LAD stent, dLAD 20%, p-mRCA 10%, mRCA 40%, patent RCA stent, EF 35-45%    Depression    Dysphagia    Family history of adverse reaction to anesthesia    GERD (gastroesophageal reflux disease)    GDJMEQAS(341.9)    Helicobacter pylori (H. pylori)    Hemorrhoids    Hernia    inguinal   HFrEF (heart failure with reduced ejection fraction) ([place])    a. 10/2016 LV gram: EF 35-45%; b. 06/2018 Echo: EF 40-45%. c. 01/2021 Echo: EF 40-45%   Hyperlipidemia    Hypertension    Insomnia    Ischemic cardiomyopathy    a. 10/2016 LV gram: EF 35-45%; b. 06/2018 Echo: EF 40-45%.   Myalgia    Oxygen dependent    Peptic ulcer    Pulmonary nodules    ST elevation (STEMI) myocardial infarction involving left anterior descending coronary artery ([place]) 11/2009   a. s/p PCI to the LAD   Status post dilation of esophageal narrowing 2000   Thickening of esophagus    Tinea pedis    Wears dentures    partial upper    Past Surgical History:  Procedure Laterality Date    coronary stents     Admission  12/20/2012   COPD exacerbation.  [place].   CARDIAC CATHETERIZATION     CARDIAC CATHETERIZATION  02/05/2013   [facility]   CARDIAC CATHETERIZATION  09/19/2013   [facility] : patent stents with no change in anatomy. EF: 40$   CARDIAC CATHETERIZATION Left 11/12/2016   Procedure: Left Heart Cath and Coronary Angiography;  Surgeon: Wellington Hampshire, MD;  Location: [place] [facility];  Service: Cardiovascular;  Laterality: Left;   COLONOSCOPY     COLONOSCOPY WITH PROPOFOL N/A 05/18/2016   Procedure: COLONOSCOPY WITH PROPOFOL;  Surgeon: Lucilla Lame, MD;  Location: [facility] ENDOSCOPY;  Service: Endoscopy;  Laterality: N/A;  COLONOSCOPY WITH PROPOFOL N/A 03/19/2020   Procedure: COLONOSCOPY WITH PROPOFOL;  Surgeon: Lin Landsman, MD;  Location: [facility] ENDOSCOPY;  Service: Gastroenterology;  Laterality: N/A;   CORONARY ANGIOPLASTY WITH STENT PLACEMENT  09/19/2009   LAD 3.0 X23 mm Xience DES, RCA: 4.0 X 15  mm Xience DES   ELECTROMAGNETIC NAVIGATION BROCHOSCOPY N/A 10/27/2017   Procedure: ELECTROMAGNETIC NAVIGATION BRONCHOSCOPY;  Surgeon: Flora Lipps, MD;  Location: [facility] ORS;  Service: Cardiopulmonary;  Laterality: N/A;   ESOPHAGEAL DILATION     ESOPHAGOGASTRODUODENOSCOPY  12/20/2006   ESOPHAGOGASTRODUODENOSCOPY  08/20/2012   ESOPHAGOGASTRODUODENOSCOPY (EGD) WITH PROPOFOL N/A 04/19/2022   Procedure: ESOPHAGOGASTRODUODENOSCOPY (EGD) WITH PROPOFOL;  Surgeon: [name], MD;  Location: [facility] ENDOSCOPY;  Service: Endoscopy;  Laterality: N/A;   ESOPHAGOGASTRODUODENOSCOPY (EGD) WITH PROPOFOL N/A 04/27/2022   Procedure: ESOPHAGOGASTRODUODENOSCOPY (EGD) WITH PROPOFOL;  Surgeon: [name], MD;  Location: [facility] ENDOSCOPY;  Service: Endoscopy;  Laterality: N/A;   EUS N/A 05/20/2022   Procedure: UPPER ENDOSCOPIC ULTRASOUND (EUS) LINEAR;  Surgeon: Reita Cliche, MD;  Location: [facility] ENDOSCOPY;  Service: Gastroenterology;  Laterality: N/A;  [facility]   HERNIA REPAIR  07/20/2012   L inguinal hernia repair   PENILE PROSTHESIS IMPLANT      Family History  Problem Relation Age of Onset   Heart attack Brother        Brother #1   Diabetes Brother    Hypertension Brother        #3   Coronary artery disease Father 62       deceased   Heart attack Father    Diabetes Father    Heart disease Father    COPD Mother 41       deceased   Alcohol abuse Sister        polysubstance abuse   COPD Sister    Lung cancer Sister    Alcohol abuse Sister        polysubstance abuse   Penile cancer Brother    Diabetes Brother    Prostate cancer Neg Hx    Bladder Cancer Neg Hx    Kidney cancer Neg Hx     Social History:  reports that he quit smoking about 3 years ago. His smoking use included cigarettes. He has a 126.00 pack-year smoking history. He has never used smokeless tobacco. He reports that he does not drink alcohol and does not use drugs.  Allergies:  Allergies  Allergen Reactions    Prozac [Fluoxetine Hcl] Shortness Of Breath   Hydroxyzine    Effexor Xr [Venlafaxine Hcl Er] Other (See Comments)    "Makes me feel funny"   Wellbutrin [Bupropion] Other (See Comments)    "Makes me feel funny"    Current Medications: Current Outpatient Medications  Medication Sig Dispense Refill   albuterol (VENTOLIN HFA) 108 (90 Base) MCG/ACT inhaler Inhale 2 puffs into the lungs every 6 (six) hours as needed for wheezing or shortness of breath. 18 g 4   aspirin 81 MG tablet Take 81 mg by mouth daily.     budesonide (PULMICORT) 0.5 MG/2ML nebulizer solution Take 2 mLs (0.5 mg total) by nebulization in the morning and at bedtime. 320 mL 12   busPIRone (BUSPAR) 7.5 MG tablet Take by mouth as directed.     fluticasone (FLONASE) 50 MCG/ACT nasal spray Place 2 sprays into both nostrils daily.     furosemide (LASIX) 40 MG tablet TAKE ONE TABLET BY MOUTH ONCE DAILY 90 tablet 2   levalbuterol (XOPENEX) 1.25 MG/3ML nebulizer  solution Take 1.25 mg by nebulization every 6 (six) hours as needed for wheezing.     loratadine (CLARITIN) 10 MG tablet Take 10 mg by mouth daily.     LORazepam (ATIVAN) 1 MG tablet Take 1 tablet (1 mg total) by mouth every 4 (four) hours while awake. 30 tablet 0   losartan (COZAAR) 25 MG tablet Take 25 mg by mouth daily.     mirtazapine (REMERON) 15 MG tablet Take 15 mg by mouth at bedtime.     Multiple Vitamin (MULTIVITAMIN) capsule Take 1 capsule by mouth daily.     nitroGLYCERIN (NITROSTAT) 0.4 MG SL tablet Place 1 tablet (0.4 mg total) under the tongue every 5 (five) minutes as needed. 25 tablet 3   OLANZapine (ZYPREXA) 2.5 MG tablet Take 2.5 mg by mouth at bedtime.     OXYGEN Inhale 2.5 L into the lungs daily.     Spacer/Aero-Holding Chambers (AEROCHAMBER MV) inhaler Use as instructed 1 each 2   sucralfate (CARAFATE) 1 g tablet Take 1 g by mouth 4 (four) times daily.     amitriptyline (ELAVIL) 25 MG tablet Take 25 mg by mouth at bedtime. 3 tabs at bedtime (Patient not  taking: Reported on 05/04/2022)     atorvastatin (LIPITOR) 80 MG tablet Take 1 tablet (80 mg total) by mouth daily. 90 tablet 3   azithromycin (ZITHROMAX) 250 MG tablet TAKE 1 TABLET BY MOUTH 3 TIMES A WEEK (Patient not taking: Reported on 05/20/2022) 12 tablet 4   esomeprazole (NEXIUM) 40 MG capsule TAKE ONE CAPSULE BY MOUTH ONCE DAILY (Patient not taking: Reported on 05/20/2022) 30 capsule 4   No current facility-administered medications for this visit.

## 2022-05-31 NOTE — Assessment & Plan Note (Addendum)
Discussed about option of PEG tube placement.

## 2022-05-31 NOTE — Assessment & Plan Note (Signed)
Gleason score 3+4, status post definitive radiation.-Continue follow-up with urology Dr. Sninsky.   PSA is stable. 

## 2022-05-31 NOTE — Assessment & Plan Note (Addendum)
Discussed with patient and family 

## 2022-05-31 NOTE — Assessment & Plan Note (Signed)
Follow up with nutritionist.  Recommend nutrition supplementation.  

## 2022-05-31 NOTE — Progress Notes (Signed)
Radiation Oncology Follow up Note old patient new area distal esophageal carcinoma  Name: Leslie Sims [facility]   Date:   05/31/2022 MRN:  [id number] DOB: 05/22/1951    This 71 y.o. male presents to the clinic today for evaluation of distal esophageal carcinoma squamous cell and patient previously treated to his prostate for stage IIa adenocarcinoma.  REFERRING PROVIDER: [name], MD  HPI: Patient is a 71 year old male well-known to our department having received radiation therapy to his prostate back in December for Gleason 7 (3+4) presenting with a PSA of 4.5.  He has been having some dysphagia underwent upper endoscopy showing an.  A large fungating mass with no bleeding 37 to 41 cm from the incisor at the GE junction.  Partially obstructing and circumferential.  Endoscopy sonographic evaluation showed possible invasion of the adventitia layer 5.  1 abnormal lymph node was visualized in the middle paraesophageal mediastinum.  Biopsy was positive for invasive moderately differentiated squamous cell carcinoma with basaloid features.  PET CT scan demonstrated hypermetabolic activity associated with the distal esophageal mass just above the level of the GE junction compatible with primary esophageal neoplasm.  No evidence of avid nodal metastasis was noted.  He is seen today for evaluation of neoadjuvant treatment.  He is having some dysphagia and some reflux.  Certain foods pass easily although others like mashed potatoes are refluxed.  He is having no pain at this time no significant urinary tract symptoms.  His most recent PSA in May was 1.1 showing a continued downward trend.  COMPLICATIONS OF TREATMENT: none  FOLLOW UP COMPLIANCE: keeps appointments   PHYSICAL EXAM:  There were no vitals taken for this visit. Well-developed male on nasal oxygen in NAD.  Well-developed well-nourished patient in NAD. HEENT reveals PERLA, EOMI, discs not visualized.  Oral cavity is clear. No oral mucosal  lesions are identified. Neck is clear without evidence of cervical or supraclavicular adenopathy. Lungs are clear to A&P. Cardiac examination is essentially unremarkable with regular rate and rhythm without murmur rub or thrill. Abdomen is benign with no organomegaly or masses noted. Motor sensory and DTR levels are equal and symmetric in the upper and lower extremities. Cranial nerves II through XII are grossly intact. Proprioception is intact. No peripheral adenopathy or edema is identified. No motor or sensory levels are noted. Crude visual fields are within normal range.  RADIOLOGY RESULTS: PET CT scans reviewed compatible with above-stated findings  PLAN: This time like to go ahead with concurrent chemoradiation therapy.  Would plan on delivering 27 Gray in 30 fractions using IMRT radiation therapy to his a distal esophagus.  Risks and benefits of treatment including worsening of his dysphagia skin reaction fatigue alteration of blood counts all reviewed in detail with the patient.  I have personally set up and ordered CT simulation for later this week.  We will coordinate his chemotherapy with medical oncology.  There will be extra effort by both professional staff as well as technical staff to coordinate and manage concurrent chemoradiation and ensuing side effects during his treatments.   I would like to take this opportunity to thank you for allowing me to participate in the care of your patient.Noreene Filbert, MD

## 2022-05-31 NOTE — Progress Notes (Signed)
START ON PATHWAY REGIMEN - Gastroesophageal     Administer weekly during RT:     Paclitaxel      Carboplatin   **Always confirm dose/schedule in your pharmacy ordering system**  Patient Characteristics: Esophageal & GE Junction, Squamous Cell, Preoperative or Nonsurgical Candidate, M0 (Clinical Staging), cT2 or Higher or cN+, Unresectable/Nonsurgical Candidate (Any cT) Therapeutic Status: Preoperative or Nonsurgical Candidate, M0 (Clinical Staging) Histology: Squamous Cell Disease Classification: Esophageal AJCC M Category: cM0 AJCC 8 Stage Grouping: II AJCC Grade: G2 AJCC Location: X AJCC T Category: cT3 AJCC N Category: cN0 Intent of Therapy: Curative Intent, Discussed with Patient

## 2022-05-31 NOTE — Assessment & Plan Note (Addendum)
Image and pathology findings were reviewed and discussed with patient I recommend concurrent chemotherapy carboplatin/Taxol weekly with radiation. The diagnosis and care plan were discussed with patient in detail.  Chemotherapy education was provided.  We had discussed the composition of chemotherapy regimen, length of chemo cycle, duration of treatment and the time to assess response to treatment.    I explained to the patient the risks and benefits of chemotherapy including all but not limited to hair loss, mouth sore, nausea, vomiting, diarrhea, low blood counts, bleeding, neuropathy and risk of life threatening infection and even death, secondary malignancy etc.  Patient voices understanding and willing to proceed chemotherapy.  Patient will see radiation oncology today. # Chemotherapy education;. Antiemetics-Zofran and Compazine;  sent to pharmacy

## 2022-06-02 ENCOUNTER — Inpatient Hospital Stay: Payer: Medicare Other

## 2022-06-02 NOTE — Progress Notes (Signed)
Nutrition Assessment   Reason for Assessment:  New esophageal cancer   ASSESSMENT:  71 year old male with new diagnosis of esophageal cancer.  Past medical history of prostate cancer, CHF, COPD, GERD, HLD, HTN, STEMI.  Patient to start concurrent chemotherapy and radiation.    Met with patient in clinic.  Reports that some foods are staying down but others he vomits back up.  Tried to eat beef last night and vomited back up but mashed potatoes were able to stay down.  Unable to eat roast beef sandwiches for lunch.  Sometimes boost shakes come back up but most of the time they stay down.  Grilled cheese most often stays down along with oatmeal, chicken noodle soup, vegetable soup.     Medications: Nexium, lasix, remeron, MVI, zyprexa, carafate, compazine   Labs: glucose 104   Anthropometrics:   Height: 70 inches Weight: 178 lb 8 oz 200 lb on 02/28/22 BMI: 25  11% weight loss in the last 3 months, significant   Estimated Energy Needs  Kcals: 2000-2400 Protein: 100-120 g Fluid: 2000-2400 ml   NUTRITION DIAGNOSIS: Inadequate oral intake related to cancer location and likely side effects from treatment as evidenced by 11% weight loss and ability of some foods to bypass esophageal mass   INTERVENTION:  Discussed chopping, grinding, puree foods for ease of swallowing.  Handout on soft, moist protein foods given to patient Discussed ways to add calories and protein in diet Recommend 350 calorie shake or higher 6-8 times per day if unable to keep down solid foods.   Provided samples of ensure complete and boost VHC. Complimentary case of ensure enlive given to patient today along with coupons.  Discussed feeding tube as option with patient today as well to provide additional nutrition.  Wants to think about this option.   Contact information provided    MONITORING, EVALUATION, GOAL: weight trends, intake   Next Visit: to be determined with treatment  Yeng Perz B. Zenia Resides, [place],  [place] Registered Dietitian [phone]

## 2022-06-03 ENCOUNTER — Ambulatory Visit
Admission: RE | Admit: 2022-06-03 | Discharge: 2022-06-03 | Disposition: A | Discharge status: Home / Self Care | Payer: Medicare Other | Source: Ambulatory Visit | Attending: Radiation Oncology | Admitting: Radiation Oncology

## 2022-06-03 DIAGNOSIS — K219 Gastro-esophageal reflux disease without esophagitis: Secondary | ICD-10-CM | POA: Insufficient documentation

## 2022-06-03 DIAGNOSIS — Z510 Encounter for antineoplastic radiation therapy: Secondary | ICD-10-CM | POA: Diagnosis not present

## 2022-06-03 DIAGNOSIS — Z8546 Personal history of malignant neoplasm of prostate: Secondary | ICD-10-CM | POA: Insufficient documentation

## 2022-06-03 DIAGNOSIS — I252 Old myocardial infarction: Secondary | ICD-10-CM | POA: Insufficient documentation

## 2022-06-03 DIAGNOSIS — J449 Chronic obstructive pulmonary disease, unspecified: Secondary | ICD-10-CM | POA: Insufficient documentation

## 2022-06-03 DIAGNOSIS — C155 Malignant neoplasm of lower third of esophagus: Secondary | ICD-10-CM | POA: Insufficient documentation

## 2022-06-03 DIAGNOSIS — Z5111 Encounter for antineoplastic chemotherapy: Secondary | ICD-10-CM | POA: Insufficient documentation

## 2022-06-03 DIAGNOSIS — E785 Hyperlipidemia, unspecified: Secondary | ICD-10-CM | POA: Insufficient documentation

## 2022-06-03 DIAGNOSIS — Z79899 Other long term (current) drug therapy: Secondary | ICD-10-CM | POA: Insufficient documentation

## 2022-06-03 DIAGNOSIS — I110 Hypertensive heart disease with heart failure: Secondary | ICD-10-CM | POA: Insufficient documentation

## 2022-06-03 MED FILL — Dexamethasone Sodium Phosphate Inj 100 MG/10ML: INTRAMUSCULAR | Qty: 1 | Status: AC

## 2022-06-04 ENCOUNTER — Inpatient Hospital Stay: Payer: Medicare Other

## 2022-06-08 DIAGNOSIS — Z510 Encounter for antineoplastic radiation therapy: Secondary | ICD-10-CM | POA: Diagnosis not present

## 2022-06-09 ENCOUNTER — Other Ambulatory Visit: Payer: Self-pay | Admitting: *Deleted

## 2022-06-09 DIAGNOSIS — C61 Malignant neoplasm of prostate: Secondary | ICD-10-CM

## 2022-06-10 ENCOUNTER — Ambulatory Visit: Admission: RE | Admit: 2022-06-10 | Payer: Medicare Other | Source: Ambulatory Visit

## 2022-06-10 ENCOUNTER — Telehealth: Payer: Self-pay

## 2022-06-10 ENCOUNTER — Other Ambulatory Visit: Payer: Self-pay | Admitting: Oncology

## 2022-06-10 NOTE — Telephone Encounter (Signed)
Pt scheduled for Lab/MD/ Carboplatin/ taxol *NEW* on 6/23. Pt informed and confirmed appts.

## 2022-06-11 ENCOUNTER — Inpatient Hospital Stay: Payer: Medicare Other

## 2022-06-11 ENCOUNTER — Inpatient Hospital Stay (HOSPITAL_BASED_OUTPATIENT_CLINIC_OR_DEPARTMENT_OTHER): Payer: Medicare Other | Admitting: Oncology

## 2022-06-11 ENCOUNTER — Encounter: Payer: Self-pay | Admitting: Oncology

## 2022-06-11 VITALS — BP 128/81 | HR 88

## 2022-06-11 DIAGNOSIS — C159 Malignant neoplasm of esophagus, unspecified: Secondary | ICD-10-CM

## 2022-06-11 DIAGNOSIS — C61 Malignant neoplasm of prostate: Secondary | ICD-10-CM

## 2022-06-11 DIAGNOSIS — Z510 Encounter for antineoplastic radiation therapy: Secondary | ICD-10-CM | POA: Diagnosis not present

## 2022-06-11 DIAGNOSIS — R634 Abnormal weight loss: Secondary | ICD-10-CM

## 2022-06-11 DIAGNOSIS — Z5111 Encounter for antineoplastic chemotherapy: Secondary | ICD-10-CM

## 2022-06-11 LAB — CBC WITH DIFFERENTIAL/PLATELET
Abs Immature Granulocytes: 0.02 10*3/uL (ref 0.00–0.07)
Basophils Absolute: 0 10*3/uL (ref 0.0–0.1)
Basophils Relative: 1 %
Eosinophils Absolute: 0.2 10*3/uL (ref 0.0–0.5)
Eosinophils Relative: 3 %
HCT: 41.3 % (ref 39.0–52.0)
Hemoglobin: 13 g/dL (ref 13.0–17.0)
Immature Granulocytes: 0 %
Lymphocytes Relative: 13 %
Lymphs Abs: 0.9 10*3/uL (ref 0.7–4.0)
MCH: 28.6 pg (ref 26.0–34.0)
MCHC: 31.5 g/dL (ref 30.0–36.0)
MCV: 91 fL (ref 80.0–100.0)
Monocytes Absolute: 0.8 10*3/uL (ref 0.1–1.0)
Monocytes Relative: 11 %
Neutro Abs: 5.2 10*3/uL (ref 1.7–7.7)
Neutrophils Relative %: 72 %
Platelets: 291 10*3/uL (ref 150–400)
RBC: 4.54 MIL/uL (ref 4.22–5.81)
RDW: 12.4 % (ref 11.5–15.5)
WBC: 7.1 10*3/uL (ref 4.0–10.5)
nRBC: 0 % (ref 0.0–0.2)

## 2022-06-11 LAB — COMPREHENSIVE METABOLIC PANEL
ALT: 20 U/L (ref 0–44)
AST: 21 U/L (ref 15–41)
Albumin: 3.9 g/dL (ref 3.5–5.0)
Alkaline Phosphatase: 74 U/L (ref 38–126)
Anion gap: 8 (ref 5–15)
BUN: 17 mg/dL (ref 8–23)
CO2: 32 mmol/L (ref 22–32)
Calcium: 9.3 mg/dL (ref 8.9–10.3)
Chloride: 99 mmol/L (ref 98–111)
Creatinine, Ser: 0.75 mg/dL (ref 0.61–1.24)
GFR, Estimated: >60 mL/min (ref >60)
Glucose, Bld: 123 mg/dL — ABNORMAL HIGH (ref 70–99)
Potassium: 3.8 mmol/L (ref 3.5–5.1)
Sodium: 139 mmol/L (ref 135–145)
Total Bilirubin: 0.4 mg/dL (ref 0.3–1.2)
Total Protein: 7.5 g/dL (ref 6.5–8.1)

## 2022-06-11 MED ORDER — DIPHENHYDRAMINE HCL 50 MG/ML IJ SOLN
50.0000 mg | Freq: Once | INTRAMUSCULAR | Status: AC
  Administered 2022-06-11: 50 mg via INTRAVENOUS
  Filled 2022-06-11: qty 1

## 2022-06-11 MED ORDER — SODIUM CHLORIDE 0.9 % IV SOLN
66.0000 mg | Freq: Once | INTRAVENOUS | Status: AC
  Administered 2022-06-11: 66 mg via INTRAVENOUS
  Filled 2022-06-11: qty 11

## 2022-06-11 MED ORDER — PALONOSETRON HCL INJECTION 0.25 MG/5ML
0.2500 mg | Freq: Once | INTRAVENOUS | Status: AC
  Administered 2022-06-11: 0.25 mg via INTRAVENOUS
  Filled 2022-06-11: qty 5

## 2022-06-11 MED ORDER — SODIUM CHLORIDE 0.9 % IV SOLN
10.0000 mg | Freq: Once | INTRAVENOUS | Status: AC
  Administered 2022-06-11: 10 mg via INTRAVENOUS
  Filled 2022-06-11: qty 10

## 2022-06-11 MED ORDER — SODIUM CHLORIDE 0.9 % IV SOLN
180.0000 mg | Freq: Once | INTRAVENOUS | Status: AC
  Administered 2022-06-11: 180 mg via INTRAVENOUS
  Filled 2022-06-11: qty 18

## 2022-06-11 MED ORDER — MONTELUKAST SODIUM 10 MG PO TABS
10.0000 mg | ORAL_TABLET | Freq: Every day | ORAL | Status: DC
  Administered 2022-06-11: 10 mg via ORAL

## 2022-06-11 MED ORDER — SODIUM CHLORIDE 0.9 % IV SOLN
Freq: Once | INTRAVENOUS | Status: AC
  Filled 2022-06-11: qty 250

## 2022-06-11 MED ORDER — FAMOTIDINE IN NACL 20-0.9 MG/50ML-% IV SOLN
20.0000 mg | Freq: Once | INTRAVENOUS | Status: AC
  Administered 2022-06-11: 20 mg via INTRAVENOUS
  Filled 2022-06-11: qty 50

## 2022-06-12 ENCOUNTER — Encounter: Payer: Self-pay | Admitting: Oncology

## 2022-06-12 NOTE — Assessment & Plan Note (Addendum)
Gleason score 3+4, status post definitive radiation.-Continue follow-up with urology Dr. Diamantina Providence.   PSA is stable.

## 2022-06-14 ENCOUNTER — Ambulatory Visit
Admission: RE | Admit: 2022-06-14 | Discharge: 2022-06-14 | Disposition: A | Discharge status: Home / Self Care | Payer: Medicare Other | Source: Ambulatory Visit | Attending: Radiation Oncology | Admitting: Radiation Oncology

## 2022-06-14 ENCOUNTER — Other Ambulatory Visit: Payer: Self-pay

## 2022-06-14 ENCOUNTER — Telehealth: Payer: Self-pay

## 2022-06-14 DIAGNOSIS — Z510 Encounter for antineoplastic radiation therapy: Secondary | ICD-10-CM | POA: Diagnosis not present

## 2022-06-14 LAB — RAD ONC ARIA SESSION SUMMARY
Course Elapsed Days: 0
Plan Fractions Treated to Date: 1
Plan Prescribed Dose Per Fraction: 1.8 Gy
Plan Total Fractions Prescribed: 30
Plan Total Prescribed Dose: 54 Gy
Reference Point Dosage Given to Date: 1.8 Gy
Reference Point Session Dosage Given: 1.8 Gy
Session Number: 1

## 2022-06-15 ENCOUNTER — Ambulatory Visit
Admission: RE | Admit: 2022-06-15 | Discharge: 2022-06-15 | Disposition: A | Discharge status: Home / Self Care | Payer: Medicare Other | Source: Ambulatory Visit | Attending: Radiation Oncology | Admitting: Radiation Oncology

## 2022-06-15 ENCOUNTER — Other Ambulatory Visit: Payer: Self-pay

## 2022-06-15 DIAGNOSIS — Z510 Encounter for antineoplastic radiation therapy: Secondary | ICD-10-CM | POA: Diagnosis not present

## 2022-06-15 LAB — RAD ONC ARIA SESSION SUMMARY
Course Elapsed Days: 1
Plan Fractions Treated to Date: 2
Plan Prescribed Dose Per Fraction: 1.8 Gy
Plan Total Fractions Prescribed: 30
Plan Total Prescribed Dose: 54 Gy
Reference Point Dosage Given to Date: 3.6 Gy
Reference Point Session Dosage Given: 1.8 Gy
Session Number: 2

## 2022-06-16 ENCOUNTER — Other Ambulatory Visit: Payer: Self-pay

## 2022-06-16 ENCOUNTER — Ambulatory Visit
Admission: RE | Admit: 2022-06-16 | Discharge: 2022-06-16 | Disposition: A | Discharge status: Home / Self Care | Payer: Medicare Other | Source: Ambulatory Visit | Attending: Radiation Oncology | Admitting: Radiation Oncology

## 2022-06-16 DIAGNOSIS — Z510 Encounter for antineoplastic radiation therapy: Secondary | ICD-10-CM | POA: Diagnosis not present

## 2022-06-16 LAB — RAD ONC ARIA SESSION SUMMARY
Course Elapsed Days: 2
Plan Fractions Treated to Date: 3
Plan Prescribed Dose Per Fraction: 1.8 Gy
Plan Total Fractions Prescribed: 30
Plan Total Prescribed Dose: 54 Gy
Reference Point Dosage Given to Date: 5.4 Gy
Reference Point Session Dosage Given: 1.8 Gy
Session Number: 3

## 2022-06-17 ENCOUNTER — Ambulatory Visit
Admission: RE | Admit: 2022-06-17 | Discharge: 2022-06-17 | Disposition: A | Discharge status: Home / Self Care | Payer: Medicare Other | Source: Ambulatory Visit | Attending: Radiation Oncology | Admitting: Radiation Oncology

## 2022-06-17 ENCOUNTER — Other Ambulatory Visit: Payer: Self-pay

## 2022-06-17 DIAGNOSIS — Z510 Encounter for antineoplastic radiation therapy: Secondary | ICD-10-CM | POA: Diagnosis not present

## 2022-06-17 LAB — RAD ONC ARIA SESSION SUMMARY
Course Elapsed Days: 3
Plan Fractions Treated to Date: 4
Plan Prescribed Dose Per Fraction: 1.8 Gy
Plan Total Fractions Prescribed: 30
Plan Total Prescribed Dose: 54 Gy
Reference Point Dosage Given to Date: 7.2 Gy
Reference Point Session Dosage Given: 1.8 Gy
Session Number: 4

## 2022-06-17 MED FILL — Dexamethasone Sodium Phosphate Inj 100 MG/10ML: INTRAMUSCULAR | Qty: 1 | Status: AC

## 2022-06-18 ENCOUNTER — Other Ambulatory Visit: Payer: Self-pay

## 2022-06-18 ENCOUNTER — Inpatient Hospital Stay: Payer: Medicare Other

## 2022-06-18 ENCOUNTER — Encounter: Payer: Self-pay | Admitting: Oncology

## 2022-06-18 ENCOUNTER — Ambulatory Visit
Admission: RE | Admit: 2022-06-18 | Discharge: 2022-06-18 | Disposition: A | Discharge status: Home / Self Care | Payer: Medicare Other | Source: Ambulatory Visit | Attending: Radiation Oncology | Admitting: Radiation Oncology

## 2022-06-18 ENCOUNTER — Inpatient Hospital Stay (HOSPITAL_BASED_OUTPATIENT_CLINIC_OR_DEPARTMENT_OTHER): Payer: Medicare Other | Admitting: Oncology

## 2022-06-18 DIAGNOSIS — Z5111 Encounter for antineoplastic chemotherapy: Secondary | ICD-10-CM

## 2022-06-18 DIAGNOSIS — Z510 Encounter for antineoplastic radiation therapy: Secondary | ICD-10-CM | POA: Diagnosis not present

## 2022-06-18 DIAGNOSIS — C159 Malignant neoplasm of esophagus, unspecified: Secondary | ICD-10-CM

## 2022-06-18 DIAGNOSIS — K5909 Other constipation: Secondary | ICD-10-CM | POA: Diagnosis not present

## 2022-06-18 DIAGNOSIS — C61 Malignant neoplasm of prostate: Secondary | ICD-10-CM | POA: Diagnosis not present

## 2022-06-18 LAB — CBC WITH DIFFERENTIAL/PLATELET
Abs Immature Granulocytes: 0.05 10*3/uL (ref 0.00–0.07)
Basophils Absolute: 0 10*3/uL (ref 0.0–0.1)
Basophils Relative: 0 %
Eosinophils Absolute: 0.2 10*3/uL (ref 0.0–0.5)
Eosinophils Relative: 2 %
HCT: 39.7 % (ref 39.0–52.0)
Hemoglobin: 12.5 g/dL — ABNORMAL LOW (ref 13.0–17.0)
Immature Granulocytes: 1 %
Lymphocytes Relative: 11 %
Lymphs Abs: 0.8 10*3/uL (ref 0.7–4.0)
MCH: 28.7 pg (ref 26.0–34.0)
MCHC: 31.5 g/dL (ref 30.0–36.0)
MCV: 91.1 fL (ref 80.0–100.0)
Monocytes Absolute: 0.9 10*3/uL (ref 0.1–1.0)
Monocytes Relative: 12 %
Neutro Abs: 5.8 10*3/uL (ref 1.7–7.7)
Neutrophils Relative %: 74 %
Platelets: 283 10*3/uL (ref 150–400)
RBC: 4.36 MIL/uL (ref 4.22–5.81)
RDW: 12.5 % (ref 11.5–15.5)
WBC: 7.7 10*3/uL (ref 4.0–10.5)
nRBC: 0 % (ref 0.0–0.2)

## 2022-06-18 LAB — COMPREHENSIVE METABOLIC PANEL
ALT: 17 U/L (ref 0–44)
AST: 19 U/L (ref 15–41)
Albumin: 3.7 g/dL (ref 3.5–5.0)
Alkaline Phosphatase: 75 U/L (ref 38–126)
Anion gap: 10 (ref 5–15)
BUN: 16 mg/dL (ref 8–23)
CO2: 32 mmol/L (ref 22–32)
Calcium: 9.1 mg/dL (ref 8.9–10.3)
Chloride: 95 mmol/L — ABNORMAL LOW (ref 98–111)
Creatinine, Ser: 0.87 mg/dL (ref 0.61–1.24)
GFR, Estimated: >60 mL/min (ref >60)
Glucose, Bld: 107 mg/dL — ABNORMAL HIGH (ref 70–99)
Potassium: 3.9 mmol/L (ref 3.5–5.1)
Sodium: 137 mmol/L (ref 135–145)
Total Bilirubin: 0.5 mg/dL (ref 0.3–1.2)
Total Protein: 7.6 g/dL (ref 6.5–8.1)

## 2022-06-18 LAB — RAD ONC ARIA SESSION SUMMARY
Course Elapsed Days: 4
Plan Fractions Treated to Date: 5
Plan Prescribed Dose Per Fraction: 1.8 Gy
Plan Total Fractions Prescribed: 30
Plan Total Prescribed Dose: 54 Gy
Reference Point Dosage Given to Date: 9 Gy
Reference Point Session Dosage Given: 1.8 Gy
Session Number: 5

## 2022-06-18 MED ORDER — FAMOTIDINE IN NACL 20-0.9 MG/50ML-% IV SOLN
20.0000 mg | Freq: Once | INTRAVENOUS | Status: AC
  Administered 2022-06-18: 20 mg via INTRAVENOUS
  Filled 2022-06-18: qty 50

## 2022-06-18 MED ORDER — SODIUM CHLORIDE 0.9 % IV SOLN
211.4000 mg | Freq: Once | INTRAVENOUS | Status: AC
  Administered 2022-06-18: 210 mg via INTRAVENOUS
  Filled 2022-06-18: qty 21

## 2022-06-18 MED ORDER — MONTELUKAST SODIUM 10 MG PO TABS
10.0000 mg | ORAL_TABLET | Freq: Once | ORAL | Status: AC
  Administered 2022-06-18: 10 mg via ORAL
  Filled 2022-06-18: qty 1

## 2022-06-18 MED ORDER — PALONOSETRON HCL INJECTION 0.25 MG/5ML
0.2500 mg | Freq: Once | INTRAVENOUS | Status: AC
  Administered 2022-06-18: 0.25 mg via INTRAVENOUS
  Filled 2022-06-18: qty 5

## 2022-06-18 MED ORDER — SODIUM CHLORIDE 0.9 % IV SOLN
10.0000 mg | Freq: Once | INTRAVENOUS | Status: AC
  Administered 2022-06-18: 10 mg via INTRAVENOUS
  Filled 2022-06-18: qty 1
  Filled 2022-06-18: qty 10

## 2022-06-18 MED ORDER — SODIUM CHLORIDE 0.9 % IV SOLN: 235.6000 mg | Freq: Once | INTRAVENOUS | Status: DC

## 2022-06-18 MED ORDER — DOCUSATE SODIUM 100 MG PO CAPS: 100.0000 mg | ORAL_CAPSULE | Freq: Two times a day (BID) | ORAL | 0 refills | Status: DC

## 2022-06-18 MED ORDER — DIPHENHYDRAMINE HCL 50 MG/ML IJ SOLN
50.0000 mg | Freq: Once | INTRAMUSCULAR | Status: AC
  Administered 2022-06-18: 50 mg via INTRAVENOUS
  Filled 2022-06-18: qty 1

## 2022-06-18 MED ORDER — SODIUM CHLORIDE 0.9 % IV SOLN
Freq: Once | INTRAVENOUS | Status: AC
  Filled 2022-06-18: qty 250

## 2022-06-18 MED ORDER — SODIUM CHLORIDE 0.9 % IV SOLN
50.0000 mg/m2 | Freq: Once | INTRAVENOUS | Status: AC
  Administered 2022-06-18: 102 mg via INTRAVENOUS
  Filled 2022-06-18: qty 17

## 2022-06-18 NOTE — Progress Notes (Unsigned)
Pt here for follow. Pt reports he felt sick after his first tx. He also reports he had constipation and tried miralax but it did not help.

## 2022-06-18 NOTE — Progress Notes (Signed)
Patient tolerated 2nd chemo infusion well, no questions/concerns voiced. Patient headed to radiation. Stable. AVS given.

## 2022-06-18 NOTE — Patient Instructions (Signed)
[facility] CTR AT [place]  Discharge Instructions: Thank you for choosing [facility] to provide your oncology and hematology care.  If you have a lab appointment with the [facility], please go directly to the [facility] and check in at the registration area.  Wear comfortable clothing and clothing appropriate for easy access to any Portacath or PICC line.   We strive to give you quality time with your provider. You may need to reschedule your appointment if you arrive late (15 or more minutes).  Arriving late affects you and other patients whose appointments are after yours.  Also, if you miss three or more appointments without notifying the office, you may be dismissed from the clinic at the provider's discretion.      For prescription refill requests, have your pharmacy contact our office and allow 72 hours for refills to be completed.    Today you received the following chemotherapy and/or immunotherapy agents: CARBOplatin/ PACLitaxel    To help prevent nausea and vomiting after your treatment, we encourage you to take your nausea medication as directed.  BELOW ARE SYMPTOMS THAT SHOULD BE REPORTED IMMEDIATELY: *FEVER GREATER THAN 100.4 F (38 C) OR HIGHER *CHILLS OR SWEATING *NAUSEA AND VOMITING THAT IS NOT CONTROLLED WITH YOUR NAUSEA MEDICATION *UNUSUAL SHORTNESS OF BREATH *UNUSUAL BRUISING OR BLEEDING *URINARY PROBLEMS (pain or burning when urinating, or frequent urination) *BOWEL PROBLEMS (unusual diarrhea, constipation, pain near the anus) TENDERNESS IN MOUTH AND THROAT WITH OR WITHOUT PRESENCE OF ULCERS (sore throat, sores in mouth, or a toothache) UNUSUAL RASH, SWELLING OR PAIN  UNUSUAL VAGINAL DISCHARGE OR ITCHING   Items with * indicate a potential emergency and should be followed up as soon as possible or go to the Emergency Department if any problems should occur.  Please show the CHEMOTHERAPY ALERT CARD or IMMUNOTHERAPY ALERT CARD  at check-in to the Emergency Department and triage nurse.  Should you have questions after your visit or need to cancel or reschedule your appointment, please contact [facility] CANCER [place] AT [place]  [phone] and follow the prompts.  Office hours are 8:00 a.m. to 4:30 p.m. Monday - Friday. Please note that voicemails left after 4:00 p.m. may not be returned until the following business day.  We are closed weekends and major holidays. You have access to a nurse at all times for urgent questions. Please call the main number to the clinic [phone] and follow the prompts.  For any non-urgent questions, you may also contact your provider using MyChart. We now offer e-Visits for anyone 69 and older to request care online for non-urgent symptoms. For details visit [email or web address].   Also download the MyChart app! Go to the app store, search "MyChart", open the app, select [facility], and log in with your MyChart username and password.  Masks are optional in the cancer centers. If you would like for your care team to wear a mask while they are taking care of you, please let them know. For doctor visits, patients may have with them one support person who is at least 69 years old. At this time, visitors are not allowed in the infusion area.

## 2022-06-19 ENCOUNTER — Encounter: Payer: Self-pay | Admitting: Oncology

## 2022-06-19 DIAGNOSIS — K5900 Constipation, unspecified: Secondary | ICD-10-CM | POA: Insufficient documentation

## 2022-06-19 NOTE — Assessment & Plan Note (Signed)
Chemotherapy plan as listed above 

## 2022-06-19 NOTE — Assessment & Plan Note (Signed)
He is on concurrent chemotherapy carboplatin/Taxol weekly with radiation. Labs reviewed and discussed with patient. Proceed with cycle 2 carboplatin/Taxol

## 2022-06-19 NOTE — Assessment & Plan Note (Signed)
Recommend Colace 100 mg twice daily.  MiraLAX daily as needed.

## 2022-06-19 NOTE — Assessment & Plan Note (Signed)
Gleason score 3+4, status post definitive radiation.-Continue follow-up with urology Dr. Diamantina Providence.   PSA is stable.

## 2022-06-19 NOTE — Progress Notes (Signed)
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 Labs reviewed and discussed with patient. Proceed with cycle 2 carboplatin/Taxol  Prostate cancer (HCC) Gleason score 3+4, status post definitive radiation.-Continue follow-up with urology Dr. Diamantina [place].   PSA is stable.  Encounter for antineoplastic chemotherapy Chemotherapy plan as listed above.  Constipation Recommend Colace 100 mg twice daily.  MiraLAX daily as needed.    The patient understands the plans discussed today and is in agreement with them.  He knows to contact our office if he develops concerns prior to his next appointment.  Follow-up 1 week lab MD carboplatin Taxol. [name]  [name], MD  No orders of the defined types were placed in this encounter.     CHIEF COMPLAINT:  Chief complaints: follow up for esophageal carcinoma treatments.  History of prostate cancer.  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 showed partially obstructive likely malignant esophageal tumor was found at [place] junction.  Biopsied.  Normal stomach and normal examined duodenum. Biopsy pathology showed fragments of the squamous mucosa with high-grade dysplastic.  Scant gastric mucosa present with reactive changes and no dysplastic or significant intestinal metaplastic.  Fragments of fibrillar purulent ulcer diabetes also present.  04/27/2022, repeat EGD and a biopsy of GE junction partially obstructed ulcerating mass. Repeat biopsy pathology showed superficial and disaggregated fragments of squamous mucosa with at least severe dysplastic/squamous cell carcinoma in situ.  Definite invasion cannot be identified.  In part secondary to specimen fragmentation and tangential sectioning.    04/27/2022 Imaging   MRI abdomen MRCP with and without contrast showed bilateral benign adrenal adenoma.  Small hepatic hemangioma and small right hepatic parenchymal vascular anomalies. Small hiatal hernia.  Colonic diverticulosis.   05/04/2022 Initial Diagnosis   Primary esophageal squamous cell carcinoma  + Stomach discomfort for 4 months, dysphagia for both solids and liquids at times. + Weight loss   05/04/2022 Cancer Staging   Staging form: Esophagus - Squamous Cell Carcinoma, AJCC 8th Edition - Clinical: Stage II (cT3, cN0, cM0, L: Lower) - Signed by [name], MD on 05/31/2022 Stage prefix: Initial diagnosis Total positive nodes: 0   05/09/2022 Imaging   PET scan showed intense tracer uptake associated with the distal esophageal mass just above the level of the GE junction compatible with primary esophageal neoplasm. No signs of tracer avid nodal metastasis or solid organ metastasis.   05/20/2022 Procedure   EUS by Dr.Spaete.  T3,Nx 05/20/2022, esophagus mass biopsy showed invasive moderately differentiated squamous cell  carcinoma, with basaloid features and minimal keratinization.  Background squamous cell carcinoma in situ.    06/03/2022 -  Chemotherapy   Patient is on Treatment Plan :  ESOPHAGUS Carboplatin/PACLitaxel weekly x 6 weeks with XRT         Patient has multiple comorbidities including COPD/chronic respiratory failure, history of STEMI/combined systolic and diastolic CHF [last LVEF 40 to 45%], lung nodules previously avid on PET scan, biopsy negative and was felt to be calcified granulomas.-Pulmonology Dr. Patsey Berthold Patient lives by himself.  Independent with ADL and some of the iADL  INTERVAL HISTORY:  [place] is here today for repeat clinical assessment. He denies fevers or chills. He denies pain. His appetite is fair His weight decreased 4 pounds since last visit. + Esophageal dysphagia, with solids.  He has no difficulty with pured food and liquids. + Fatigue and tiredness after last treatment.  Today no nausea vomiting diarrhea. + Constipation  REVIEW OF SYSTEMS:  Review of Systems  Constitutional:  Positive for appetite change, fatigue and unexpected weight change. Negative for chills and fever.  HENT:   Negative for hearing loss and voice change.   Eyes:  Negative for eye problems and icterus.  Respiratory:  Positive for shortness of breath. Negative for chest tightness and cough.        Chronic respiratory failure on oxygen  Cardiovascular:  Negative for chest pain and leg swelling.  Gastrointestinal:  Positive for constipation. Negative for abdominal distention and abdominal pain.       Dysphagia  Endocrine: Negative for hot flashes.  Genitourinary:  Negative for difficulty urinating, dysuria and frequency.   Musculoskeletal:  Negative for arthralgias.  Skin:  Negative for itching and rash.  Neurological:  Negative for light-headedness and numbness.  Hematological:  Negative for adenopathy. Does not bruise/bleed easily.  Psychiatric/Behavioral:  Negative for confusion.      VITALS:  Blood pressure 115/68, pulse 93, temperature 97.6 F (36.4 C), resp. rate 18, weight 176 lb 6.4 oz (80  kg), SpO2 98 %, peak flow (!) 3 L/min.  Wt Readings from Last 3 Encounters:  06/18/22 176 lb 6.4 oz (80 kg)  06/11/22 180 lb (81.6 kg)  06/02/22 178 lb 8 oz (81 kg)    Body mass index is 25.31 kg/m.  Performance status (ECOG): 2 - Symptomatic, <50% confined to bed  PHYSICAL EXAM:  Physical Exam Constitutional:      General: He is not in acute distress.    Appearance: He is obese. He is not diaphoretic.  HENT:     Head: Normocephalic and atraumatic.     Nose: Nose normal.     Mouth/Throat:     Pharynx: No oropharyngeal exudate.  Eyes:     General: No scleral icterus.    Pupils: Pupils are equal, round, and reactive to light.  Cardiovascular:     Rate and Rhythm: Normal rate.     Heart sounds: No murmur heard. Pulmonary:     Effort: Pulmonary effort is normal. No respiratory distress.     Comments: Patient is on nasal cannula oxygen Abdominal:     General: There is no distension.     Palpations: Abdomen is soft.     Tenderness: There is no abdominal tenderness.  Musculoskeletal:        General: Normal range of motion.     Cervical back: Normal range of motion and neck supple.  Skin:    General: Skin is warm and dry.     Findings: No erythema.  Neurological:     Mental Status: He is alert and oriented to person, place, and time.     Cranial Nerves: No cranial nerve deficit.     Motor: No abnormal muscle tone.     Coordination: Coordination normal.  Psychiatric:        Mood and Affect: Mood and affect normal.      LABS:      Latest Ref Rng & Units 06/18/2022    8:55 AM 06/11/2022    8:54 AM 05/04/2022   12:14 PM  CBC  WBC 4.0 - 10.5 K/uL 7.7  7.1  9.5   Hemoglobin 13.0 - 17.0 g/dL 12.5  13.0  13.6   Hematocrit 39.0 - 52.0 % 39.7  41.3  42.9   Platelets 150 - 400 K/uL 283  291  282       Latest Ref Rng & Units 06/18/2022    8:55 AM 06/11/2022    8:54 AM 05/04/2022   12:14 PM  CMP  Glucose 70 - 99 mg/dL 107  123  104   BUN 8 - 23 mg/dL '16  17  8   '$ Creatinine  0.61 - 1.24 mg/dL 0.87  0.75  0.87   Sodium 135 - 145 mmol/L 137  139  141   Potassium 3.5 - 5.1 mmol/L 3.9  3.8  3.9   Chloride 98 - 111 mmol/L 95  99  99   CO2 22 - 32 mmol/L 32  32  31   Calcium 8.9 - 10.3 mg/dL 9.1  9.3  9.1   Total Protein 6.5 - 8.1 g/dL 7.6  7.5  7.7   Total Bilirubin 0.3 - 1.2 mg/dL 0.5  0.4  0.7   Alkaline Phos 38 - 126 U/L 75  74  92   AST 15 - 41 U/L '19  21  18   '$ ALT 0 - 44 U/L '17  20  20      '$ Lab Results  Component Value Date   CEA1 3.4 05/04/2022   Lab Results  Component Value Date   PSA1 3.3 07/27/2017   STUDIES:  No results found.    HISTORY:   Past Medical History:  Diagnosis Date   Allergic rhinitis    Anxiety    Back pain    Cancer (HCC)    prostate   CHF (congestive heart failure) ([place])    Colon polyps 08/2012   colonoscopy; multiple colon polyps; repeat colonoscopy in one year.   Complication of anesthesia    COPD (chronic obstructive pulmonary disease) ([place])    a. on home O2.   Coronary artery disease 11/2009   a. late presenting ant MI; b. LHC 100% pLAD s/p PCI/DES, 99% mRCA s/p PCI/DES, EF 35%; c. nuclear stress test 05/13: prior ant/inf infarcts w/o ischemia, EF 43%; d. LHC 02/14: widely patent stents with no other obs dz, EF 40%; e. LHC 11/17: LM nl, mLAD 10%, patent LAD stent, dLAD 20%, p-mRCA 10%, mRCA 40%, patent RCA stent, EF 35-45%    Depression    Dysphagia    Family history of adverse reaction to anesthesia    GERD (gastroesophageal reflux disease)    WOEHOZYY(482.5)    Helicobacter pylori (H. pylori)    Hemorrhoids    Hernia    inguinal   HFrEF (heart failure with reduced ejection fraction) ([place])    a. 10/2016 LV gram: EF 35-45%; b. 06/2018 Echo: EF 40-45%. c. 01/2021 Echo: EF 40-45%   Hyperlipidemia    Hypertension  Insomnia    Ischemic cardiomyopathy    a. 10/2016 LV gram: EF 35-45%; b. 06/2018 Echo: EF 40-45%.   Myalgia    Oxygen dependent    Peptic ulcer    Pulmonary nodules    ST elevation (STEMI)  myocardial infarction involving left anterior descending coronary artery ([place]) 11/2009   a. s/p PCI to the LAD   Status post dilation of esophageal narrowing 2000   Thickening of esophagus    Tinea pedis    Wears dentures    partial upper    Past Surgical History:  Procedure Laterality Date    coronary stents     Admission  12/20/2012   COPD exacerbation.  [place].   CARDIAC CATHETERIZATION     CARDIAC CATHETERIZATION  02/05/2013   [facility]   CARDIAC CATHETERIZATION  09/19/2013   [facility] : patent stents with no change in anatomy. EF: 40$   CARDIAC CATHETERIZATION Left 11/12/2016   Procedure: Left Heart Cath and Coronary Angiography;  Surgeon: Wellington Hampshire, MD;  Location: [facility];  Service: Cardiovascular;  Laterality: Left;   COLONOSCOPY     COLONOSCOPY WITH PROPOFOL N/A 05/18/2016   Procedure: COLONOSCOPY WITH PROPOFOL;  Surgeon: Lucilla Lame, MD;  Location: [facility] ENDOSCOPY;  Service: Endoscopy;  Laterality: N/A;   COLONOSCOPY WITH PROPOFOL N/A 03/19/2020   Procedure: COLONOSCOPY WITH PROPOFOL;  Surgeon: Lin Landsman, MD;  [facility] ENDOSCOPY;  Service: Gastroenterology;  Laterality: N/A;   CORONARY ANGIOPLASTY WITH STENT PLACEMENT  09/19/2009   LAD 3.0 X23 mm Xience DES, RCA: 4.0 X 15 mm Xience DES   ELECTROMAGNETIC NAVIGATION BROCHOSCOPY N/A 10/27/2017   Procedure: ELECTROMAGNETIC NAVIGATION BRONCHOSCOPY;  Surgeon: Flora Lipps, MD;  Location: [facility] ORS;  Service: Cardiopulmonary;  Laterality: N/A;   ESOPHAGEAL DILATION     ESOPHAGOGASTRODUODENOSCOPY  12/20/2006   ESOPHAGOGASTRODUODENOSCOPY  08/20/2012   ESOPHAGOGASTRODUODENOSCOPY (EGD) WITH PROPOFOL N/A 04/19/2022   Procedure: ESOPHAGOGASTRODUODENOSCOPY (EGD) WITH PROPOFOL;  Surgeon: [name], MD;  Location: [facility] ENDOSCOPY;  Service: Endoscopy;  Laterality: N/A;   ESOPHAGOGASTRODUODENOSCOPY (EGD) WITH PROPOFOL N/A 04/27/2022   Procedure: ESOPHAGOGASTRODUODENOSCOPY (EGD) WITH PROPOFOL;  Surgeon:  [name], MD;  Location: [facility] ENDOSCOPY;  Service: Endoscopy;  Laterality: N/A;   EUS N/A 05/20/2022   Procedure: UPPER ENDOSCOPIC ULTRASOUND (EUS) LINEAR;  Surgeon: Reita Cliche, MD;  Location: [facility] ENDOSCOPY;  Service: Gastroenterology;  Laterality: N/A;  [facility]   HERNIA REPAIR  07/20/2012   L inguinal hernia repair   PENILE PROSTHESIS IMPLANT      Family History  Problem Relation Age of Onset   Heart attack Brother        Brother #1   Diabetes Brother    Hypertension Brother        #3   Coronary artery disease Father 95       deceased   Heart attack Father    Diabetes Father    Heart disease Father    COPD Mother 71       deceased   Alcohol abuse Sister        polysubstance abuse   COPD Sister    Lung cancer Sister    Alcohol abuse Sister        polysubstance abuse   Penile cancer Brother    Diabetes Brother    Prostate cancer Neg Hx    Bladder Cancer Neg Hx    Kidney cancer Neg Hx     Social History:  reports that he quit smoking about 3  years ago. His smoking use included cigarettes. He has a 126.00 pack-year smoking history. He has never used smokeless tobacco. He reports that he does not drink alcohol and does not use drugs.  Allergies:  Allergies  Allergen Reactions   Prozac [Fluoxetine Hcl] Shortness Of Breath   Hydroxyzine    Effexor Xr [Venlafaxine Hcl Er] Other (See Comments)    "Makes me feel funny"   Wellbutrin [Bupropion] Other (See Comments)    "Makes me feel funny"    Current Medications: Current Outpatient Medications  Medication Sig Dispense Refill   albuterol (VENTOLIN HFA) 108 (90 Base) MCG/ACT inhaler Inhale 2 puffs into the lungs every 6 (six) hours as needed for wheezing or shortness of breath. 18 g 4   aspirin 81 MG tablet Take 81 mg by mouth daily.     atorvastatin (LIPITOR) 80 MG tablet Take 1 tablet (80 mg total) by mouth daily. 90 tablet 3   budesonide (PULMICORT) 0.5 MG/2ML nebulizer solution Take 2 mLs (0.5 mg total)  by nebulization in the morning and at bedtime. 320 mL 12   busPIRone (BUSPAR) 7.5 MG tablet Take by mouth as directed.     docusate sodium (COLACE) 100 MG capsule Take 1 capsule (100 mg total) by mouth 2 (two) times daily. 60 capsule 0   esomeprazole (NEXIUM) 40 MG capsule TAKE ONE CAPSULE BY MOUTH ONCE DAILY 30 capsule 4   fluticasone (FLONASE) 50 MCG/ACT nasal spray Place 2 sprays into both nostrils daily.     furosemide (LASIX) 40 MG tablet TAKE ONE TABLET BY MOUTH ONCE DAILY 90 tablet 2   levalbuterol (XOPENEX) 1.25 MG/3ML nebulizer solution Take 1.25 mg by nebulization every 6 (six) hours as needed for wheezing.     lidocaine-prilocaine (EMLA) cream Apply to affected area once 30 g 3   loratadine (CLARITIN) 10 MG tablet Take 10 mg by mouth daily.     LORazepam (ATIVAN) 1 MG tablet Take 1 tablet (1 mg total) by mouth every 4 (four) hours while awake. 30 tablet 0   losartan (COZAAR) 25 MG tablet Take 25 mg by mouth daily.     mirtazapine (REMERON) 15 MG tablet Take 15 mg by mouth at bedtime.     Multiple Vitamin (MULTIVITAMIN) capsule Take 1 capsule by mouth daily.     OLANZapine (ZYPREXA) 2.5 MG tablet Take 2.5 mg by mouth at bedtime.     ondansetron (ZOFRAN) 8 MG tablet Take 1 tablet (8 mg total) by mouth 2 (two) times daily as needed for refractory nausea / vomiting. Start on day 3 after chemo. 30 tablet 1   OXYGEN Inhale 2.5 L into the lungs daily.     polyethylene glycol (MIRALAX) 17 g packet Take 17 g by mouth daily as needed.     prochlorperazine (COMPAZINE) 10 MG tablet Take 1 tablet (10 mg total) by mouth every 6 (six) hours as needed (Nausea or vomiting). 30 tablet 1   Spacer/Aero-Holding Chambers (AEROCHAMBER MV) inhaler Use as instructed 1 each 2   sucralfate (CARAFATE) 1 g tablet Take 1 g by mouth 4 (four) times daily.     amitriptyline (ELAVIL) 25 MG tablet Take 25 mg by mouth at bedtime. 3 tabs at bedtime (Patient not taking: Reported on 06/18/2022)     nitroGLYCERIN (NITROSTAT)  0.4 MG SL tablet Place 1 tablet (0.4 mg total) under the tongue every 5 (five) minutes as needed. (Patient not taking: Reported on 06/18/2022) 25 tablet 3   No current facility-administered medications for this  visit.

## 2022-06-21 ENCOUNTER — Ambulatory Visit
Admission: RE | Admit: 2022-06-21 | Discharge: 2022-06-21 | Disposition: A | Discharge status: Home / Self Care | Payer: Medicare Other | Source: Ambulatory Visit | Attending: Radiation Oncology | Admitting: Radiation Oncology

## 2022-06-21 ENCOUNTER — Other Ambulatory Visit: Payer: Self-pay

## 2022-06-21 DIAGNOSIS — J449 Chronic obstructive pulmonary disease, unspecified: Secondary | ICD-10-CM | POA: Insufficient documentation

## 2022-06-21 DIAGNOSIS — E785 Hyperlipidemia, unspecified: Secondary | ICD-10-CM | POA: Insufficient documentation

## 2022-06-21 DIAGNOSIS — Z801 Family history of malignant neoplasm of trachea, bronchus and lung: Secondary | ICD-10-CM | POA: Insufficient documentation

## 2022-06-21 DIAGNOSIS — Z510 Encounter for antineoplastic radiation therapy: Secondary | ICD-10-CM | POA: Insufficient documentation

## 2022-06-21 DIAGNOSIS — I110 Hypertensive heart disease with heart failure: Secondary | ICD-10-CM | POA: Insufficient documentation

## 2022-06-21 DIAGNOSIS — K208 Other esophagitis without bleeding: Secondary | ICD-10-CM | POA: Diagnosis not present

## 2022-06-21 DIAGNOSIS — I5040 Unspecified combined systolic (congestive) and diastolic (congestive) heart failure: Secondary | ICD-10-CM | POA: Insufficient documentation

## 2022-06-21 DIAGNOSIS — Z5111 Encounter for antineoplastic chemotherapy: Secondary | ICD-10-CM | POA: Insufficient documentation

## 2022-06-21 DIAGNOSIS — C155 Malignant neoplasm of lower third of esophagus: Secondary | ICD-10-CM | POA: Insufficient documentation

## 2022-06-21 DIAGNOSIS — T451X5A Adverse effect of antineoplastic and immunosuppressive drugs, initial encounter: Secondary | ICD-10-CM | POA: Diagnosis not present

## 2022-06-21 DIAGNOSIS — C61 Malignant neoplasm of prostate: Secondary | ICD-10-CM | POA: Diagnosis not present

## 2022-06-21 DIAGNOSIS — Z79899 Other long term (current) drug therapy: Secondary | ICD-10-CM | POA: Insufficient documentation

## 2022-06-21 DIAGNOSIS — I252 Old myocardial infarction: Secondary | ICD-10-CM | POA: Insufficient documentation

## 2022-06-21 DIAGNOSIS — D6481 Anemia due to antineoplastic chemotherapy: Secondary | ICD-10-CM | POA: Insufficient documentation

## 2022-06-21 DIAGNOSIS — J9610 Chronic respiratory failure, unspecified whether with hypoxia or hypercapnia: Secondary | ICD-10-CM | POA: Insufficient documentation

## 2022-06-21 DIAGNOSIS — R1310 Dysphagia, unspecified: Secondary | ICD-10-CM | POA: Insufficient documentation

## 2022-06-21 DIAGNOSIS — K219 Gastro-esophageal reflux disease without esophagitis: Secondary | ICD-10-CM | POA: Insufficient documentation

## 2022-06-21 DIAGNOSIS — Z87891 Personal history of nicotine dependence: Secondary | ICD-10-CM | POA: Diagnosis not present

## 2022-06-21 DIAGNOSIS — Z8546 Personal history of malignant neoplasm of prostate: Secondary | ICD-10-CM | POA: Insufficient documentation

## 2022-06-21 DIAGNOSIS — I509 Heart failure, unspecified: Secondary | ICD-10-CM | POA: Insufficient documentation

## 2022-06-21 LAB — RAD ONC ARIA SESSION SUMMARY
Course Elapsed Days: 7
Plan Fractions Treated to Date: 6
Plan Prescribed Dose Per Fraction: 1.8 Gy
Plan Total Fractions Prescribed: 30
Plan Total Prescribed Dose: 54 Gy
Reference Point Dosage Given to Date: 10.8 Gy
Reference Point Session Dosage Given: 1.8 Gy
Session Number: 6

## 2022-06-23 ENCOUNTER — Other Ambulatory Visit: Payer: Self-pay

## 2022-06-23 ENCOUNTER — Ambulatory Visit
Admission: RE | Admit: 2022-06-23 | Discharge: 2022-06-23 | Disposition: A | Discharge status: Home / Self Care | Payer: Medicare Other | Source: Ambulatory Visit | Attending: Radiation Oncology | Admitting: Radiation Oncology

## 2022-06-23 DIAGNOSIS — Z5111 Encounter for antineoplastic chemotherapy: Secondary | ICD-10-CM | POA: Diagnosis not present

## 2022-06-23 LAB — RAD ONC ARIA SESSION SUMMARY
Course Elapsed Days: 9
Plan Fractions Treated to Date: 7
Plan Prescribed Dose Per Fraction: 1.8 Gy
Plan Total Fractions Prescribed: 30
Plan Total Prescribed Dose: 54 Gy
Reference Point Dosage Given to Date: 12.6 Gy
Reference Point Session Dosage Given: 1.8 Gy
Session Number: 7

## 2022-06-24 ENCOUNTER — Other Ambulatory Visit: Payer: Self-pay

## 2022-06-24 ENCOUNTER — Ambulatory Visit
Admission: RE | Admit: 2022-06-24 | Discharge: 2022-06-24 | Disposition: A | Discharge status: Home / Self Care | Payer: Medicare Other | Source: Ambulatory Visit | Attending: Radiation Oncology | Admitting: Radiation Oncology

## 2022-06-24 DIAGNOSIS — Z5111 Encounter for antineoplastic chemotherapy: Secondary | ICD-10-CM | POA: Diagnosis not present

## 2022-06-24 LAB — RAD ONC ARIA SESSION SUMMARY
Course Elapsed Days: 10
Plan Fractions Treated to Date: 8
Plan Prescribed Dose Per Fraction: 1.8 Gy
Plan Total Fractions Prescribed: 30
Plan Total Prescribed Dose: 54 Gy
Reference Point Dosage Given to Date: 14.4 Gy
Reference Point Session Dosage Given: 1.8 Gy
Session Number: 8

## 2022-06-25 ENCOUNTER — Inpatient Hospital Stay: Payer: Medicare Other

## 2022-06-25 ENCOUNTER — Ambulatory Visit
Admission: RE | Admit: 2022-06-25 | Discharge: 2022-06-25 | Disposition: A | Discharge status: Home / Self Care | Payer: Medicare Other | Source: Ambulatory Visit | Attending: Radiation Oncology | Admitting: Radiation Oncology

## 2022-06-25 ENCOUNTER — Other Ambulatory Visit: Payer: Self-pay

## 2022-06-25 ENCOUNTER — Inpatient Hospital Stay: Payer: Medicare Other | Attending: Oncology

## 2022-06-25 ENCOUNTER — Inpatient Hospital Stay (HOSPITAL_BASED_OUTPATIENT_CLINIC_OR_DEPARTMENT_OTHER): Payer: Medicare Other | Admitting: Oncology

## 2022-06-25 ENCOUNTER — Encounter: Payer: Self-pay | Admitting: Oncology

## 2022-06-25 VITALS — BP 117/67 | HR 89

## 2022-06-25 DIAGNOSIS — C159 Malignant neoplasm of esophagus, unspecified: Secondary | ICD-10-CM | POA: Insufficient documentation

## 2022-06-25 DIAGNOSIS — Z5111 Encounter for antineoplastic chemotherapy: Secondary | ICD-10-CM | POA: Insufficient documentation

## 2022-06-25 DIAGNOSIS — D6481 Anemia due to antineoplastic chemotherapy: Secondary | ICD-10-CM | POA: Diagnosis not present

## 2022-06-25 DIAGNOSIS — J449 Chronic obstructive pulmonary disease, unspecified: Secondary | ICD-10-CM | POA: Insufficient documentation

## 2022-06-25 DIAGNOSIS — J9610 Chronic respiratory failure, unspecified whether with hypoxia or hypercapnia: Secondary | ICD-10-CM | POA: Insufficient documentation

## 2022-06-25 DIAGNOSIS — Z79899 Other long term (current) drug therapy: Secondary | ICD-10-CM | POA: Insufficient documentation

## 2022-06-25 DIAGNOSIS — K208 Other esophagitis without bleeding: Secondary | ICD-10-CM | POA: Insufficient documentation

## 2022-06-25 DIAGNOSIS — Z87891 Personal history of nicotine dependence: Secondary | ICD-10-CM | POA: Insufficient documentation

## 2022-06-25 DIAGNOSIS — R1310 Dysphagia, unspecified: Secondary | ICD-10-CM | POA: Insufficient documentation

## 2022-06-25 DIAGNOSIS — Z801 Family history of malignant neoplasm of trachea, bronchus and lung: Secondary | ICD-10-CM | POA: Insufficient documentation

## 2022-06-25 DIAGNOSIS — C61 Malignant neoplasm of prostate: Secondary | ICD-10-CM

## 2022-06-25 DIAGNOSIS — I5040 Unspecified combined systolic (congestive) and diastolic (congestive) heart failure: Secondary | ICD-10-CM | POA: Insufficient documentation

## 2022-06-25 DIAGNOSIS — T451X5A Adverse effect of antineoplastic and immunosuppressive drugs, initial encounter: Secondary | ICD-10-CM | POA: Insufficient documentation

## 2022-06-25 DIAGNOSIS — R634 Abnormal weight loss: Secondary | ICD-10-CM | POA: Diagnosis not present

## 2022-06-25 DIAGNOSIS — I509 Heart failure, unspecified: Secondary | ICD-10-CM | POA: Insufficient documentation

## 2022-06-25 DIAGNOSIS — I252 Old myocardial infarction: Secondary | ICD-10-CM | POA: Insufficient documentation

## 2022-06-25 LAB — RAD ONC ARIA SESSION SUMMARY
Course Elapsed Days: 11
Plan Fractions Treated to Date: 9
Plan Prescribed Dose Per Fraction: 1.8 Gy
Plan Total Fractions Prescribed: 30
Plan Total Prescribed Dose: 54 Gy
Reference Point Dosage Given to Date: 16.2 Gy
Reference Point Session Dosage Given: 1.8 Gy
Session Number: 9

## 2022-06-25 LAB — COMPREHENSIVE METABOLIC PANEL
ALT: 18 U/L (ref 0–44)
AST: 19 U/L (ref 15–41)
Albumin: 3.7 g/dL (ref 3.5–5.0)
Alkaline Phosphatase: 65 U/L (ref 38–126)
Anion gap: 8 (ref 5–15)
BUN: 17 mg/dL (ref 8–23)
CO2: 32 mmol/L (ref 22–32)
Calcium: 9.1 mg/dL (ref 8.9–10.3)
Chloride: 95 mmol/L — ABNORMAL LOW (ref 98–111)
Creatinine, Ser: 0.69 mg/dL (ref 0.61–1.24)
GFR, Estimated: >60 mL/min (ref >60)
Glucose, Bld: 112 mg/dL — ABNORMAL HIGH (ref 70–99)
Potassium: 4.2 mmol/L (ref 3.5–5.1)
Sodium: 135 mmol/L (ref 135–145)
Total Bilirubin: 0.6 mg/dL (ref 0.3–1.2)
Total Protein: 6.8 g/dL (ref 6.5–8.1)

## 2022-06-25 LAB — CBC WITH DIFFERENTIAL/PLATELET
Abs Immature Granulocytes: 0.04 10*3/uL (ref 0.00–0.07)
Basophils Absolute: 0 10*3/uL (ref 0.0–0.1)
Basophils Relative: 0 %
Eosinophils Absolute: 0.1 10*3/uL (ref 0.0–0.5)
Eosinophils Relative: 2 %
HCT: 38.5 % — ABNORMAL LOW (ref 39.0–52.0)
Hemoglobin: 12.3 g/dL — ABNORMAL LOW (ref 13.0–17.0)
Immature Granulocytes: 1 %
Lymphocytes Relative: 8 %
Lymphs Abs: 0.5 10*3/uL — ABNORMAL LOW (ref 0.7–4.0)
MCH: 29.1 pg (ref 26.0–34.0)
MCHC: 31.9 g/dL (ref 30.0–36.0)
MCV: 91 fL (ref 80.0–100.0)
Monocytes Absolute: 0.5 10*3/uL (ref 0.1–1.0)
Monocytes Relative: 8 %
Neutro Abs: 5.6 10*3/uL (ref 1.7–7.7)
Neutrophils Relative %: 81 %
Platelets: 281 10*3/uL (ref 150–400)
RBC: 4.23 MIL/uL (ref 4.22–5.81)
RDW: 13.1 % (ref 11.5–15.5)
WBC: 6.8 10*3/uL (ref 4.0–10.5)
nRBC: 0 % (ref 0.0–0.2)

## 2022-06-25 LAB — PSA: Prostatic Specific Antigen: 0.76 ng/mL (ref 0.00–4.00)

## 2022-06-25 MED ORDER — PALONOSETRON HCL INJECTION 0.25 MG/5ML
0.2500 mg | Freq: Once | INTRAVENOUS | Status: AC
  Administered 2022-06-25: 0.25 mg via INTRAVENOUS
  Filled 2022-06-25: qty 5

## 2022-06-25 MED ORDER — FAMOTIDINE IN NACL 20-0.9 MG/50ML-% IV SOLN
20.0000 mg | Freq: Once | INTRAVENOUS | Status: AC
  Administered 2022-06-25: 20 mg via INTRAVENOUS
  Filled 2022-06-25: qty 50

## 2022-06-25 MED ORDER — SODIUM CHLORIDE 0.9 % IV SOLN
10.0000 mg | Freq: Once | INTRAVENOUS | Status: AC
  Administered 2022-06-25: 10 mg via INTRAVENOUS
  Filled 2022-06-25: qty 1

## 2022-06-25 MED ORDER — SODIUM CHLORIDE 0.9 % IV SOLN
Freq: Once | INTRAVENOUS | Status: AC
  Filled 2022-06-25: qty 250

## 2022-06-25 MED ORDER — MONTELUKAST SODIUM 10 MG PO TABS
10.0000 mg | ORAL_TABLET | Freq: Every day | ORAL | Status: DC
  Administered 2022-06-25: 10 mg via ORAL

## 2022-06-25 MED ORDER — DIPHENHYDRAMINE HCL 50 MG/ML IJ SOLN
50.0000 mg | Freq: Once | INTRAMUSCULAR | Status: AC
  Administered 2022-06-25: 50 mg via INTRAVENOUS
  Filled 2022-06-25: qty 1

## 2022-06-25 MED ORDER — SODIUM CHLORIDE 0.9 % IV SOLN
211.4000 mg | Freq: Once | INTRAVENOUS | Status: AC
  Administered 2022-06-25: 210 mg via INTRAVENOUS
  Filled 2022-06-25: qty 21

## 2022-06-25 MED ORDER — SODIUM CHLORIDE 0.9 % IV SOLN
50.0000 mg/m2 | Freq: Once | INTRAVENOUS | Status: AC
  Administered 2022-06-25: 102 mg via INTRAVENOUS
  Filled 2022-06-25: qty 17

## 2022-06-25 NOTE — Patient Instructions (Signed)
[facility]  Discharge Instructions: Thank you for choosing [facility] to provide your oncology and hematology care.  If you have a lab appointment with the Cancer Center, please go directly to the Cancer Center and check in at the registration area.  Wear comfortable clothing and clothing appropriate for easy access to any Portacath or PICC line.   We strive to give you quality time with your provider. You may need to reschedule your appointment if you arrive late (15 or more minutes).  Arriving late affects you and other patients whose appointments are after yours.  Also, if you miss three or more appointments without notifying the office, you may be dismissed from the clinic at the provider's discretion.      For prescription refill requests, have your pharmacy contact our office and allow 72 hours for refills to be completed.       To help prevent nausea and vomiting after your treatment, we encourage you to take your nausea medication as directed.  BELOW ARE SYMPTOMS THAT SHOULD BE REPORTED IMMEDIATELY: *FEVER GREATER THAN 100.4 F (38 C) OR HIGHER *CHILLS OR SWEATING *NAUSEA AND VOMITING THAT IS NOT CONTROLLED WITH YOUR NAUSEA MEDICATION *UNUSUAL SHORTNESS OF BREATH *UNUSUAL BRUISING OR BLEEDING *URINARY PROBLEMS (pain or burning when urinating, or frequent urination) *BOWEL PROBLEMS (unusual diarrhea, constipation, pain near the anus) TENDERNESS IN MOUTH AND THROAT WITH OR WITHOUT PRESENCE OF ULCERS (sore throat, sores in mouth, or a toothache) UNUSUAL RASH, SWELLING OR PAIN  UNUSUAL VAGINAL DISCHARGE OR ITCHING   Items with * indicate a potential emergency and should be followed up as soon as possible or go to the Emergency Department if any problems should occur.  Please show the CHEMOTHERAPY ALERT CARD or IMMUNOTHERAPY ALERT CARD at check-in to the Emergency Department and triage nurse.  Should you have questions after your  visit or need to cancel or reschedule your appointment, please contact [facility]  [phone] and follow the prompts.  Office hours are 8:00 a.m. to 4:30 p.m. Monday - Friday. Please note that voicemails left after 4:00 p.m. may not be returned until the following business day.  We are closed weekends and major holidays. You have access to a nurse at all times for urgent questions. Please call the main number to the clinic [phone] and follow the prompts.  For any non-urgent questions, you may also contact your provider using MyChart. We now offer e-Visits for anyone 18 and older to request care online for non-urgent symptoms. For details visit [email or web address].   Also download the MyChart app! Go to the app store, search "MyChart", open the app, select [place], and log in with your MyChart username and password.  Masks are optional in the cancer centers. If you would like for your care team to wear a mask while they are taking care of you, please let them know. For doctor visits, patients may have with them one support person who is at least 69 years old. At this time, visitors are not allowed in the infusion area.   

## 2022-06-26 ENCOUNTER — Encounter: Payer: Self-pay | Admitting: Oncology

## 2022-06-26 DIAGNOSIS — D6481 Anemia due to antineoplastic chemotherapy: Secondary | ICD-10-CM | POA: Insufficient documentation

## 2022-06-26 NOTE — Assessment & Plan Note (Signed)
Follow up with nutritionist.  Recommend nutrition supplementation.  

## 2022-06-26 NOTE — Assessment & Plan Note (Signed)
Chemotherapy plan as listed above 

## 2022-06-26 NOTE — Progress Notes (Signed)
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 Labs reviewed and discussed with patient. Proceed with carboplatin/Taxol  Weight loss Follow-up with nutritionist.  Recommend nutrition supplementation.  Encounter for antineoplastic chemotherapy Chemotherapy plan as listed above.  Anemia due to antineoplastic chemotherapy Hemoglobin is decreased as expected while on treatment.  Monitor.   Prostate cancer ([place]) Gleason score 3+4, status post definitive radiation. Continue follow-up with urology Dr. Diamantina Providence.   PSA is stable.   No orders of the defined types were placed in this encounter.   The patient understands the plans discussed today and is in agreement with them.  He knows to contact our office if he develops concerns prior to his next appointment.   [name], MD      CHIEF COMPLAINT:  Chief complaints: follow up for esophageal carcinoma treatments.  History of prostate cancer.  HISTORY OF PRESENT ILLNESS:   Oncology History  Prostate cancer ([place])  10/11/2020 Initial Diagnosis   Prostate cancer ([place])   05/04/2022 Cancer Staging   Staging form: Prostate, AJCC 8th Edition - Clinical: cT2c, cN0, cM0, Grade Group: 2 - Signed by [name], MD on 05/04/2022 Gleason score: 7 Histologic grading system: 5 grade system   Primary esophageal  squamous cell carcinoma ([place])  04/19/2022 Procedure   04/19/2022 EGD by [name] showed partially obstructive likely malignant esophageal tumor was found at [place] junction.  Biopsied.  Normal stomach and normal examined duodenum. Biopsy pathology showed fragments of the squamous mucosa with high-grade dysplastic.  Scant gastric mucosa present with reactive changes and no dysplastic or significant intestinal metaplastic.  Fragments of fibrillar purulent ulcer diabetes also present.  04/27/2022, repeat EGD and a biopsy of GE junction partially obstructed ulcerating mass. Repeat biopsy pathology showed superficial and disaggregated fragments of squamous mucosa with at least severe dysplastic/squamous cell carcinoma in situ.  Definite invasion cannot be identified.  In part secondary to specimen fragmentation and tangential sectioning.    04/27/2022 Imaging   MRI abdomen MRCP with and without contrast showed bilateral benign adrenal adenoma.  Small hepatic hemangioma and small right hepatic parenchymal vascular anomalies. Small hiatal hernia.  Colonic diverticulosis.   05/04/2022 Initial Diagnosis   Primary esophageal squamous cell carcinoma  + Stomach discomfort for 4 months, dysphagia for both solids and liquids at times. + Weight loss   05/04/2022 Cancer Staging   Staging form: Esophagus - Squamous Cell Carcinoma, AJCC 8th Edition - Clinical: Stage II (cT3, cN0, cM0, L: Lower) - Signed by [name], MD on 05/31/2022 Stage prefix: Initial diagnosis Total positive nodes: 0   05/09/2022 Imaging   PET scan showed intense tracer uptake associated with the distal esophageal mass just above the level of the GE junction compatible with primary esophageal neoplasm. No signs of tracer avid nodal metastasis or solid organ metastasis.   05/20/2022 Procedure   EUS by Dr.Spaete.  T3,Nx 05/20/2022, esophagus mass biopsy  showed invasive moderately differentiated squamous cell carcinoma, with basaloid features and minimal  keratinization.  Background squamous cell carcinoma in situ.   06/03/2022 -  Chemotherapy   Patient is on Treatment Plan :  ESOPHAGUS Carboplatin/PACLitaxel weekly x 6 weeks with XRT         Patient has multiple comorbidities including COPD/chronic respiratory failure, history of STEMI/combined systolic and diastolic CHF [last LVEF 40 to 45%], lung nodules previously avid on PET scan, biopsy negative and was felt to be calcified granulomas.-Pulmonology Dr. Patsey Berthold Patient lives by himself.  Independent with ADL and some of the iADL  INTERVAL HISTORY:  Hamzeh is here today for repeat clinical assessment. He denies fevers or chills. He denies pain. His appetite is fair His weight is stable. + Esophageal dysphagia, with solids.  He has no difficulty with pured food and liquids. + Fatigue and tiredness Today no nausea vomiting diarrhea.   REVIEW OF SYSTEMS:  Review of Systems  Constitutional:  Positive for appetite change, fatigue and unexpected weight change. Negative for chills and fever.  HENT:   Negative for hearing loss and voice change.   Eyes:  Negative for eye problems and icterus.  Respiratory:  Positive for shortness of breath. Negative for chest tightness and cough.        Chronic respiratory failure on oxygen  Cardiovascular:  Negative for chest pain and leg swelling.  Gastrointestinal:  Positive for constipation. Negative for abdominal distention and abdominal pain.       Dysphagia  Endocrine: Negative for hot flashes.  Genitourinary:  Negative for difficulty urinating, dysuria and frequency.   Musculoskeletal:  Negative for arthralgias.  Skin:  Negative for itching and rash.  Neurological:  Negative for light-headedness and numbness.  Hematological:  Negative for adenopathy. Does not bruise/bleed easily.  Psychiatric/Behavioral:  Negative for confusion.      VITALS:  Blood pressure 126/68, pulse 96, temperature (!) 96.7 F (35.9 C), temperature source Tympanic, weight  177 lb (80.3 kg).  Wt Readings from Last 3 Encounters:  06/25/22 177 lb (80.3 kg)  06/18/22 176 lb 6.4 oz (80 kg)  06/11/22 180 lb (81.6 kg)    Body mass index is 25.4 kg/m.  Performance status (ECOG): 2 - Symptomatic, <50% confined to bed  PHYSICAL EXAM:  Physical Exam Constitutional:      General: He is not in acute distress.    Appearance: He is obese. He is not diaphoretic.  HENT:     Head: Normocephalic and atraumatic.     Nose: Nose normal.     Mouth/Throat:     Pharynx: No oropharyngeal exudate.  Eyes:     General: No scleral icterus.    Pupils: Pupils are equal, round, and reactive to light.  Cardiovascular:     Rate and Rhythm: Normal rate.     Heart sounds: No murmur heard. Pulmonary:     Effort: Pulmonary effort is normal. No respiratory distress.     Comments: Patient is on nasal cannula oxygen Abdominal:     General: There is no distension.     Palpations: Abdomen is soft.     Tenderness: There is no abdominal tenderness.  Musculoskeletal:        General: Normal range of motion.     Cervical back: Normal range of motion and neck supple.  Skin:    General: Skin is warm and dry.     Findings: No erythema.  Neurological:     Mental Status: He is alert and oriented to  person, place, and time.     Cranial Nerves: No cranial nerve deficit.     Motor: No abnormal muscle tone.     Coordination: Coordination normal.  Psychiatric:        Mood and Affect: Mood and affect normal.      LABS:      Latest Ref Rng & Units 06/25/2022   11:44 AM 06/18/2022    8:55 AM 06/11/2022    8:54 AM  CBC  WBC 4.0 - 10.5 K/uL 6.8  7.7  7.1   Hemoglobin 13.0 - 17.0 g/dL 12.3  12.5  13.0   Hematocrit 39.0 - 52.0 % 38.5  39.7  41.3   Platelets 150 - 400 K/uL 281  283  291       Latest Ref Rng & Units 06/25/2022   11:44 AM 06/18/2022    8:55 AM 06/11/2022    8:54 AM  CMP  Glucose 70 - 99 mg/dL 112  107  123   BUN 8 - 23 mg/dL '17  16  17   '$ Creatinine 0.61 - 1.24 mg/dL 0.69   0.87  0.75   Sodium 135 - 145 mmol/L 135  137  139   Potassium 3.5 - 5.1 mmol/L 4.2  3.9  3.8   Chloride 98 - 111 mmol/L 95  95  99   CO2 22 - 32 mmol/L 32  32  32   Calcium 8.9 - 10.3 mg/dL 9.1  9.1  9.3   Total Protein 6.5 - 8.1 g/dL 6.8  7.6  7.5   Total Bilirubin 0.3 - 1.2 mg/dL 0.6  0.5  0.4   Alkaline Phos 38 - 126 U/L 65  75  74   AST 15 - 41 U/L '19  19  21   '$ ALT 0 - 44 U/L '18  17  20      '$ Lab Results  Component Value Date   CEA1 3.4 05/04/2022   Lab Results  Component Value Date   PSA1 3.3 07/27/2017   STUDIES:  No results found.    HISTORY:   Past Medical History:  Diagnosis Date   Allergic rhinitis    Anxiety    Back pain    Cancer (HCC)    prostate   CHF (congestive heart failure) ([place])    Colon polyps 08/2012   colonoscopy; multiple colon polyps; repeat colonoscopy in one year.   Complication of anesthesia    COPD (chronic obstructive pulmonary disease) ([place])    a. on home O2.   Coronary artery disease 11/2009   a. late presenting ant MI; b. LHC 100% pLAD s/p PCI/DES, 99% mRCA s/p PCI/DES, EF 35%; c. nuclear stress test 05/13: prior ant/inf infarcts w/o ischemia, EF 43%; d. LHC 02/14: widely patent stents with no other obs dz, EF 40%; e. LHC 11/17: LM nl, mLAD 10%, patent LAD stent, dLAD 20%, p-mRCA 10%, mRCA 40%, patent RCA stent, EF 35-45%    Depression    Dysphagia    Family history of adverse reaction to anesthesia    GERD (gastroesophageal reflux disease)    YCXKGYJE(563.1)    Helicobacter pylori (H. pylori)    Hemorrhoids    Hernia    inguinal   HFrEF (heart failure with reduced ejection fraction) ([place])    a. 10/2016 LV gram: EF 35-45%; b. 06/2018 Echo: EF 40-45%. c. 01/2021 Echo: EF 40-45%   Hyperlipidemia    Hypertension    Insomnia    Ischemic cardiomyopathy    a.  10/2016 LV gram: EF 35-45%; b. 06/2018 Echo: EF 40-45%.   Myalgia    Oxygen dependent    Peptic ulcer    Pulmonary nodules    ST elevation (STEMI) myocardial infarction  involving left anterior descending coronary artery ([place]) 11/2009   a. s/p PCI to the LAD   Status post dilation of esophageal narrowing 2000   Thickening of esophagus    Tinea pedis    Wears dentures    partial upper    Past Surgical History:  Procedure Laterality Date    coronary stents     Admission  12/20/2012   COPD exacerbation.  [facility].   CARDIAC CATHETERIZATION     CARDIAC CATHETERIZATION  02/05/2013   [facility]   CARDIAC CATHETERIZATION  09/19/2013   [facility] : patent stents with no change in anatomy. EF: 40$   CARDIAC CATHETERIZATION Left 11/12/2016   Procedure: Left Heart Cath and Coronary Angiography;  Surgeon: Wellington Hampshire, MD;  Location: [facility];  Service: Cardiovascular;  Laterality: Left;   COLONOSCOPY     COLONOSCOPY WITH PROPOFOL N/A 05/18/2016   Procedure: COLONOSCOPY WITH PROPOFOL;  Surgeon: Lucilla Lame, MD;  Location: [facility] ENDOSCOPY;  Service: Endoscopy;  Laterality: N/A;   COLONOSCOPY WITH PROPOFOL N/A 03/19/2020   Procedure: COLONOSCOPY WITH PROPOFOL;  Surgeon: Lin Landsman, MD;  Location: [facility] ENDOSCOPY;  Service: Gastroenterology;  Laterality: N/A;   CORONARY ANGIOPLASTY WITH STENT PLACEMENT  09/19/2009   LAD 3.0 X23 mm Xience DES, RCA: 4.0 X 15 mm Xience DES   ELECTROMAGNETIC NAVIGATION BROCHOSCOPY N/A 10/27/2017   Procedure: ELECTROMAGNETIC NAVIGATION BRONCHOSCOPY;  Surgeon: Flora Lipps, MD;  Location: [facility] ORS;  Service: Cardiopulmonary;  Laterality: N/A;   ESOPHAGEAL DILATION     ESOPHAGOGASTRODUODENOSCOPY  12/20/2006   ESOPHAGOGASTRODUODENOSCOPY  08/20/2012   ESOPHAGOGASTRODUODENOSCOPY (EGD) WITH PROPOFOL N/A 04/19/2022   Procedure: ESOPHAGOGASTRODUODENOSCOPY (EGD) WITH PROPOFOL;  Surgeon: [name], MD;  Location: [facility] ENDOSCOPY;  Service: Endoscopy;  Laterality: N/A;   ESOPHAGOGASTRODUODENOSCOPY (EGD) WITH PROPOFOL N/A 04/27/2022   Procedure: ESOPHAGOGASTRODUODENOSCOPY (EGD) WITH PROPOFOL;  Surgeon: [name], MD;   Location: [facility] ENDOSCOPY;  Service: Endoscopy;  Laterality: N/A;   EUS N/A 05/20/2022   Procedure: UPPER ENDOSCOPIC ULTRASOUND (EUS) LINEAR;  Surgeon: Reita Cliche, MD;  Location: [facility] ENDOSCOPY;  Service: Gastroenterology;  Laterality: N/A;  [facility]   HERNIA REPAIR  07/20/2012   L inguinal hernia repair   PENILE PROSTHESIS IMPLANT      Family History  Problem Relation Age of Onset   Heart attack Brother        Brother #1   Diabetes Brother    Hypertension Brother        #3   Coronary artery disease Father 51       deceased   Heart attack Father    Diabetes Father    Heart disease Father    COPD Mother 78       deceased   Alcohol abuse Sister        polysubstance abuse   COPD Sister    Lung cancer Sister    Alcohol abuse Sister        polysubstance abuse   Penile cancer Brother    Diabetes Brother    Prostate cancer Neg Hx    Bladder Cancer Neg Hx    Kidney cancer Neg Hx     Social History:  reports that he quit smoking about 3 years ago. His smoking use included cigarettes. He has a  126.00 pack-year smoking history. He has never used smokeless tobacco. He reports that he does not drink alcohol and does not use drugs.  Allergies:  Allergies  Allergen Reactions   Prozac [Fluoxetine Hcl] Shortness Of Breath   Hydroxyzine    Effexor Xr [Venlafaxine Hcl Er] Other (See Comments)    "Makes me feel funny"   Wellbutrin [Bupropion] Other (See Comments)    "Makes me feel funny"    Current Medications: Current Outpatient Medications  Medication Sig Dispense Refill   albuterol (VENTOLIN HFA) 108 (90 Base) MCG/ACT inhaler Inhale 2 puffs into the lungs every 6 (six) hours as needed for wheezing or shortness of breath. 18 g 4   aspirin 81 MG tablet Take 81 mg by mouth daily.     budesonide (PULMICORT) 0.5 MG/2ML nebulizer solution Take 2 mLs (0.5 mg total) by nebulization in the morning and at bedtime. 320 mL 12   busPIRone (BUSPAR) 7.5 MG tablet Take by mouth as directed.      docusate sodium (COLACE) 100 MG capsule Take 1 capsule (100 mg total) by mouth 2 (two) times daily. 60 capsule 0   esomeprazole (NEXIUM) 40 MG capsule TAKE ONE CAPSULE BY MOUTH ONCE DAILY 30 capsule 4   fluticasone (FLONASE) 50 MCG/ACT nasal spray Place 2 sprays into both nostrils daily.     furosemide (LASIX) 40 MG tablet TAKE ONE TABLET BY MOUTH ONCE DAILY 90 tablet 2   levalbuterol (XOPENEX) 1.25 MG/3ML nebulizer solution Take 1.25 mg by nebulization every 6 (six) hours as needed for wheezing.     lidocaine-prilocaine (EMLA) cream Apply to affected area once 30 g 3   loratadine (CLARITIN) 10 MG tablet Take 10 mg by mouth daily.     LORazepam (ATIVAN) 1 MG tablet Take 1 tablet (1 mg total) by mouth every 4 (four) hours while awake. 30 tablet 0   losartan (COZAAR) 25 MG tablet Take 25 mg by mouth daily.     mirtazapine (REMERON) 15 MG tablet Take 15 mg by mouth at bedtime.     Multiple Vitamin (MULTIVITAMIN) capsule Take 1 capsule by mouth daily.     OLANZapine (ZYPREXA) 2.5 MG tablet Take 2.5 mg by mouth at bedtime.     ondansetron (ZOFRAN) 8 MG tablet Take 1 tablet (8 mg total) by mouth 2 (two) times daily as needed for refractory nausea / vomiting. Start on day 3 after chemo. 30 tablet 1   OXYGEN Inhale 2.5 L into the lungs daily.     polyethylene glycol (MIRALAX) 17 g packet Take 17 g by mouth daily as needed.     prochlorperazine (COMPAZINE) 10 MG tablet Take 1 tablet (10 mg total) by mouth every 6 (six) hours as needed (Nausea or vomiting). 30 tablet 1   Spacer/Aero-Holding Chambers (AEROCHAMBER MV) inhaler Use as instructed 1 each 2   sucralfate (CARAFATE) 1 g tablet Take 1 g by mouth 4 (four) times daily.     amitriptyline (ELAVIL) 25 MG tablet Take 25 mg by mouth at bedtime. 3 tabs at bedtime (Patient not taking: Reported on 06/18/2022)     atorvastatin (LIPITOR) 80 MG tablet Take 1 tablet (80 mg total) by mouth daily. 90 tablet 3   nitroGLYCERIN (NITROSTAT) 0.4 MG SL tablet Place 1  tablet (0.4 mg total) under the tongue every 5 (five) minutes as needed. (Patient not taking: Reported on 06/18/2022) 25 tablet 3   No current facility-administered medications for this visit.

## 2022-06-26 NOTE — Assessment & Plan Note (Signed)
Gleason score 3+4, status post definitive radiation. Continue follow-up with urology Dr. Diamantina Providence.   PSA is stable.

## 2022-06-26 NOTE — Assessment & Plan Note (Signed)
Hemoglobin is decreased as expected while on treatment.  Monitor.

## 2022-06-26 NOTE — Assessment & Plan Note (Signed)
He is on concurrent chemotherapy carboplatin/Taxol weekly with radiation. Labs reviewed and discussed with patient. Proceed with carboplatin/Taxol

## 2022-06-28 ENCOUNTER — Ambulatory Visit
Admission: RE | Admit: 2022-06-28 | Discharge: 2022-06-28 | Disposition: A | Discharge status: Home / Self Care | Payer: Medicare Other | Source: Ambulatory Visit | Attending: Radiation Oncology | Admitting: Radiation Oncology

## 2022-06-28 ENCOUNTER — Other Ambulatory Visit: Payer: Self-pay

## 2022-06-28 DIAGNOSIS — Z5111 Encounter for antineoplastic chemotherapy: Secondary | ICD-10-CM | POA: Diagnosis not present

## 2022-06-28 LAB — RAD ONC ARIA SESSION SUMMARY
Course Elapsed Days: 14
Plan Fractions Treated to Date: 10
Plan Prescribed Dose Per Fraction: 1.8 Gy
Plan Total Fractions Prescribed: 30
Plan Total Prescribed Dose: 54 Gy
Reference Point Dosage Given to Date: 18 Gy
Reference Point Session Dosage Given: 1.8 Gy
Session Number: 10

## 2022-06-29 ENCOUNTER — Other Ambulatory Visit: Payer: Self-pay

## 2022-06-29 ENCOUNTER — Ambulatory Visit
Admission: RE | Admit: 2022-06-29 | Discharge: 2022-06-29 | Disposition: A | Discharge status: Home / Self Care | Payer: Medicare Other | Source: Ambulatory Visit | Attending: Radiation Oncology | Admitting: Radiation Oncology

## 2022-06-29 DIAGNOSIS — Z5111 Encounter for antineoplastic chemotherapy: Secondary | ICD-10-CM | POA: Diagnosis not present

## 2022-06-29 LAB — RAD ONC ARIA SESSION SUMMARY
Course Elapsed Days: 15
Plan Fractions Treated to Date: 11
Plan Prescribed Dose Per Fraction: 1.8 Gy
Plan Total Fractions Prescribed: 30
Plan Total Prescribed Dose: 54 Gy
Reference Point Dosage Given to Date: 19.8 Gy
Reference Point Session Dosage Given: 1.8 Gy
Session Number: 11

## 2022-06-30 ENCOUNTER — Ambulatory Visit
Admission: RE | Admit: 2022-06-30 | Discharge: 2022-06-30 | Disposition: A | Discharge status: Home / Self Care | Payer: Medicare Other | Source: Ambulatory Visit | Attending: Radiation Oncology | Admitting: Radiation Oncology

## 2022-06-30 ENCOUNTER — Other Ambulatory Visit: Payer: Self-pay

## 2022-06-30 DIAGNOSIS — Z5111 Encounter for antineoplastic chemotherapy: Secondary | ICD-10-CM | POA: Diagnosis not present

## 2022-06-30 LAB — RAD ONC ARIA SESSION SUMMARY
Course Elapsed Days: 16
Plan Fractions Treated to Date: 12
Plan Prescribed Dose Per Fraction: 1.8 Gy
Plan Total Fractions Prescribed: 30
Plan Total Prescribed Dose: 54 Gy
Reference Point Dosage Given to Date: 21.6 Gy
Reference Point Session Dosage Given: 1.8 Gy
Session Number: 12

## 2022-07-01 ENCOUNTER — Other Ambulatory Visit: Payer: Self-pay

## 2022-07-01 ENCOUNTER — Ambulatory Visit
Admission: RE | Admit: 2022-07-01 | Discharge: 2022-07-01 | Disposition: A | Discharge status: Home / Self Care | Payer: Medicare Other | Source: Ambulatory Visit | Attending: Radiation Oncology | Admitting: Radiation Oncology

## 2022-07-01 DIAGNOSIS — Z5111 Encounter for antineoplastic chemotherapy: Secondary | ICD-10-CM | POA: Diagnosis not present

## 2022-07-01 LAB — RAD ONC ARIA SESSION SUMMARY
Course Elapsed Days: 17
Plan Fractions Treated to Date: 13
Plan Prescribed Dose Per Fraction: 1.8 Gy
Plan Total Fractions Prescribed: 30
Plan Total Prescribed Dose: 54 Gy
Reference Point Dosage Given to Date: 23.4 Gy
Reference Point Session Dosage Given: 1.8 Gy
Session Number: 13

## 2022-07-02 ENCOUNTER — Inpatient Hospital Stay: Payer: Medicare Other

## 2022-07-02 ENCOUNTER — Inpatient Hospital Stay: Payer: Medicare Other | Admitting: Nurse Practitioner

## 2022-07-02 ENCOUNTER — Other Ambulatory Visit: Payer: Self-pay

## 2022-07-02 ENCOUNTER — Ambulatory Visit
Admission: RE | Admit: 2022-07-02 | Discharge: 2022-07-02 | Disposition: A | Discharge status: Home / Self Care | Payer: Medicare Other | Source: Ambulatory Visit | Attending: Radiation Oncology | Admitting: Radiation Oncology

## 2022-07-02 ENCOUNTER — Inpatient Hospital Stay (HOSPITAL_BASED_OUTPATIENT_CLINIC_OR_DEPARTMENT_OTHER): Payer: Medicare Other | Admitting: Nurse Practitioner

## 2022-07-02 ENCOUNTER — Encounter: Payer: Self-pay | Admitting: Nurse Practitioner

## 2022-07-02 VITALS — BP 110/71 | HR 78 | Temp 98.3°F | Wt 177.0 lb

## 2022-07-02 DIAGNOSIS — Z5111 Encounter for antineoplastic chemotherapy: Secondary | ICD-10-CM

## 2022-07-02 DIAGNOSIS — C159 Malignant neoplasm of esophagus, unspecified: Secondary | ICD-10-CM | POA: Diagnosis not present

## 2022-07-02 DIAGNOSIS — R1319 Other dysphagia: Secondary | ICD-10-CM

## 2022-07-02 LAB — RAD ONC ARIA SESSION SUMMARY
Course Elapsed Days: 18
Plan Fractions Treated to Date: 14
Plan Prescribed Dose Per Fraction: 1.8 Gy
Plan Total Fractions Prescribed: 30
Plan Total Prescribed Dose: 54 Gy
Reference Point Dosage Given to Date: 25.2 Gy
Reference Point Session Dosage Given: 1.8 Gy
Session Number: 14

## 2022-07-02 LAB — COMPREHENSIVE METABOLIC PANEL
ALT: 17 U/L (ref 0–44)
AST: 16 U/L (ref 15–41)
Albumin: 3.6 g/dL (ref 3.5–5.0)
Alkaline Phosphatase: 69 U/L (ref 38–126)
Anion gap: 8 (ref 5–15)
BUN: 18 mg/dL (ref 8–23)
CO2: 31 mmol/L (ref 22–32)
Calcium: 8.8 mg/dL — ABNORMAL LOW (ref 8.9–10.3)
Chloride: 96 mmol/L — ABNORMAL LOW (ref 98–111)
Creatinine, Ser: 0.68 mg/dL (ref 0.61–1.24)
GFR, Estimated: >60 mL/min (ref >60)
Glucose, Bld: 111 mg/dL — ABNORMAL HIGH (ref 70–99)
Potassium: 3.9 mmol/L (ref 3.5–5.1)
Sodium: 135 mmol/L (ref 135–145)
Total Bilirubin: 0.5 mg/dL (ref 0.3–1.2)
Total Protein: 6.4 g/dL — ABNORMAL LOW (ref 6.5–8.1)

## 2022-07-02 LAB — CBC WITH DIFFERENTIAL/PLATELET
Abs Immature Granulocytes: 0.03 10*3/uL (ref 0.00–0.07)
Basophils Absolute: 0 10*3/uL (ref 0.0–0.1)
Basophils Relative: 1 %
Eosinophils Absolute: 0 10*3/uL (ref 0.0–0.5)
Eosinophils Relative: 1 %
HCT: 37.9 % — ABNORMAL LOW (ref 39.0–52.0)
Hemoglobin: 12 g/dL — ABNORMAL LOW (ref 13.0–17.0)
Immature Granulocytes: 1 %
Lymphocytes Relative: 13 %
Lymphs Abs: 0.5 10*3/uL — ABNORMAL LOW (ref 0.7–4.0)
MCH: 28.7 pg (ref 26.0–34.0)
MCHC: 31.7 g/dL (ref 30.0–36.0)
MCV: 90.7 fL (ref 80.0–100.0)
Monocytes Absolute: 0.6 10*3/uL (ref 0.1–1.0)
Monocytes Relative: 14 %
Neutro Abs: 3 10*3/uL (ref 1.7–7.7)
Neutrophils Relative %: 70 %
Platelets: 214 10*3/uL (ref 150–400)
RBC: 4.18 MIL/uL — ABNORMAL LOW (ref 4.22–5.81)
RDW: 13.7 % (ref 11.5–15.5)
WBC: 4.3 10*3/uL (ref 4.0–10.5)
nRBC: 0 % (ref 0.0–0.2)

## 2022-07-02 MED ORDER — SODIUM CHLORIDE 0.9 % IV SOLN
211.4000 mg | Freq: Once | INTRAVENOUS | Status: AC
  Administered 2022-07-02: 210 mg via INTRAVENOUS
  Filled 2022-07-02: qty 21

## 2022-07-02 MED ORDER — PALONOSETRON HCL INJECTION 0.25 MG/5ML
0.2500 mg | Freq: Once | INTRAVENOUS | Status: AC
  Administered 2022-07-02: 0.25 mg via INTRAVENOUS
  Filled 2022-07-02: qty 5

## 2022-07-02 MED ORDER — FAMOTIDINE IN NACL 20-0.9 MG/50ML-% IV SOLN
20.0000 mg | Freq: Once | INTRAVENOUS | Status: AC
  Administered 2022-07-02: 20 mg via INTRAVENOUS
  Filled 2022-07-02: qty 50

## 2022-07-02 MED ORDER — SODIUM CHLORIDE 0.9 % IV SOLN
Freq: Once | INTRAVENOUS | Status: AC
  Filled 2022-07-02: qty 250

## 2022-07-02 MED ORDER — DIPHENHYDRAMINE HCL 50 MG/ML IJ SOLN
50.0000 mg | Freq: Once | INTRAMUSCULAR | Status: AC
  Administered 2022-07-02: 50 mg via INTRAVENOUS
  Filled 2022-07-02: qty 1

## 2022-07-02 MED ORDER — SODIUM CHLORIDE 0.9 % IV SOLN
10.0000 mg | Freq: Once | INTRAVENOUS | Status: AC
  Administered 2022-07-02: 10 mg via INTRAVENOUS
  Filled 2022-07-02: qty 1

## 2022-07-02 MED ORDER — SODIUM CHLORIDE 0.9 % IV SOLN
50.0000 mg/m2 | Freq: Once | INTRAVENOUS | Status: AC
  Administered 2022-07-02: 102 mg via INTRAVENOUS
  Filled 2022-07-02: qty 17

## 2022-07-02 MED ORDER — MONTELUKAST SODIUM 10 MG PO TABS
10.0000 mg | ORAL_TABLET | Freq: Once | ORAL | Status: AC
  Administered 2022-07-02: 10 mg via ORAL
  Filled 2022-07-02: qty 1

## 2022-07-02 NOTE — Progress Notes (Signed)
Patient here for oncology follow-up appointment, reports dizziness couple of days a week

## 2022-07-02 NOTE — Progress Notes (Signed)
Nutrition Follow-up:   Patient with esophageal cancer.  Patient to start concurrent chemotherapy and radiation.    Met with patient in infusion.  Patient reports that some foods are staying down.  Wishes more foods would stay down.  Has been eating cheerios, vegetable soup, chicken noodle soup, oatmeal cookies, mashed potatoes and gravy, salisbury steak and gravy, macaroni and cheese.  Ate hamburger last night but it did not stay down.  Denies nausea. Reports some constipation but that is better with miralax and stool softner. Patient has purchased the 350 calorie and 530 calorie shake and has been drinking 2 or so 350 calorie shake and 1-2 500 calorie shake.      Medications: reviewed  Labs: reviewed  Anthropometrics:    Weight 177 lb today  178 lb 8 oz on 6/14 200 lb on 3/12   NUTRITION DIAGNOSIS: Inadequate oral intake continues    INTERVENTION:  Continue 350 and 500 calorie shakes Reviewed chopping foods, adding moisture to foods for calories and ease of swallowing.      MONITORING, EVALUATION, GOAL: weight trends, intake   NEXT VISIT: Friday, July 21 during infusion  Nekayla Heider B. Zenia Resides, [place], [place] Registered Dietitian [phone]

## 2022-07-02 NOTE — Patient Instructions (Signed)
[facility]  Discharge Instructions: Thank you for choosing [facility] to provide your oncology and hematology care.  If you have a lab appointment with the Cancer Center, please go directly to the Cancer Center and check in at the registration area.  Wear comfortable clothing and clothing appropriate for easy access to any Portacath or PICC line.   We strive to give you quality time with your provider. You may need to reschedule your appointment if you arrive late (15 or more minutes).  Arriving late affects you and other patients whose appointments are after yours.  Also, if you miss three or more appointments without notifying the office, you may be dismissed from the clinic at the provider's discretion.      For prescription refill requests, have your pharmacy contact our office and allow 72 hours for refills to be completed.       To help prevent nausea and vomiting after your treatment, we encourage you to take your nausea medication as directed.  BELOW ARE SYMPTOMS THAT SHOULD BE REPORTED IMMEDIATELY: *FEVER GREATER THAN 100.4 F (38 C) OR HIGHER *CHILLS OR SWEATING *NAUSEA AND VOMITING THAT IS NOT CONTROLLED WITH YOUR NAUSEA MEDICATION *UNUSUAL SHORTNESS OF BREATH *UNUSUAL BRUISING OR BLEEDING *URINARY PROBLEMS (pain or burning when urinating, or frequent urination) *BOWEL PROBLEMS (unusual diarrhea, constipation, pain near the anus) TENDERNESS IN MOUTH AND THROAT WITH OR WITHOUT PRESENCE OF ULCERS (sore throat, sores in mouth, or a toothache) UNUSUAL RASH, SWELLING OR PAIN  UNUSUAL VAGINAL DISCHARGE OR ITCHING   Items with * indicate a potential emergency and should be followed up as soon as possible or go to the Emergency Department if any problems should occur.  Please show the CHEMOTHERAPY ALERT CARD or IMMUNOTHERAPY ALERT CARD at check-in to the Emergency Department and triage nurse.  Should you have questions after your  visit or need to cancel or reschedule your appointment, please contact [facility]  [phone] and follow the prompts.  Office hours are 8:00 a.m. to 4:30 p.m. Monday - Friday. Please note that voicemails left after 4:00 p.m. may not be returned until the following business day.  We are closed weekends and major holidays. You have access to a nurse at all times for urgent questions. Please call the main number to the clinic [phone] and follow the prompts.  For any non-urgent questions, you may also contact your provider using MyChart. We now offer e-Visits for anyone 18 and older to request care online for non-urgent symptoms. For details visit [email or web address].   Also download the MyChart app! Go to the app store, search "MyChart", open the app, select [place], and log in with your MyChart username and password.  Masks are optional in the cancer centers. If you would like for your care team to wear a mask while they are taking care of you, please let them know. For doctor visits, patients may have with them one support person who is at least 69 years old. At this time, visitors are not allowed in the infusion area.   

## 2022-07-02 NOTE — Progress Notes (Signed)
Hematology/Oncology Progress Note [phone] [phone]  Clinic Day:  07/02/2022   Referring physician: [name], MD  CHIEF COMPLAINT:  Chief complaints: follow up for esophageal carcinoma treatments.  History of prostate cancer.  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 showed partially obstructive likely malignant esophageal tumor was found at [place] junction.  Biopsied.  Normal stomach and normal examined duodenum. Biopsy pathology showed fragments of the squamous mucosa with high-grade dysplastic.  Scant gastric mucosa present with reactive changes and no dysplastic or significant intestinal metaplastic.  Fragments of fibrillar purulent ulcer diabetes also present.  04/27/2022, repeat EGD and a biopsy of GE junction partially obstructed ulcerating mass. Repeat biopsy pathology showed superficial and disaggregated fragments of squamous mucosa with at least severe dysplastic/squamous cell carcinoma in situ.  Definite invasion cannot be identified.  In part secondary to specimen fragmentation and tangential sectioning.    04/27/2022 Imaging   MRI abdomen MRCP with and without contrast showed bilateral benign adrenal adenoma.  Small hepatic hemangioma and small right hepatic parenchymal vascular anomalies. Small hiatal hernia.  Colonic diverticulosis.   05/04/2022 Initial Diagnosis   Primary esophageal squamous cell carcinoma  + Stomach discomfort for 4 months, dysphagia for both solids and liquids at times. + Weight loss   05/04/2022 Cancer Staging   Staging form:  Esophagus - Squamous Cell Carcinoma, AJCC 8th Edition - Clinical: Stage II (cT3, cN0, cM0, L: Lower) - Signed by [name], MD on 05/31/2022 Stage prefix: Initial diagnosis Total positive nodes: 0   05/09/2022 Imaging   PET scan showed intense tracer uptake associated with the distal esophageal mass just above the level of the GE junction compatible with primary esophageal neoplasm. No signs of tracer avid nodal metastasis or solid organ metastasis.   05/20/2022 Procedure   EUS by Dr.Spaete.  T3,Nx 05/20/2022, esophagus mass biopsy showed invasive moderately differentiated squamous cell carcinoma, with basaloid features and minimal keratinization.  Background squamous cell carcinoma in situ.   06/03/2022 -  Chemotherapy   Patient is on Treatment Plan :  ESOPHAGUS Carboplatin/PACLitaxel weekly x 6 weeks with XRT         Patient has multiple comorbidities including COPD/chronic respiratory failure, history of STEMI/combined systolic and diastolic CHF [last LVEF 40 to 45%], lung nodules previously avid on PET scan, biopsy negative and was felt to be calcified granulomas.-Pulmonology Dr. Patsey Berthold Patient lives by himself.  Independent with ADL and some of the iADL  INTERVAL HISTORY:  Leslie Sims is here today for evaluation and consideration of chemotherapy. He continues radiation. Says he is tolerating treatments well. Hasn't eaten this morning. Has esophageal dysphagia with solid foods but eats purees and liquids without complications. Weight is stable. No nausea, vomiting, diarrhea.   REVIEW OF SYSTEMS:  Review of Systems  Constitutional:  Positive for appetite change and fatigue. Negative for unexpected weight change.  HENT:   Negative for mouth sores, sore throat and trouble swallowing.   Respiratory:  Negative for chest tightness and shortness of breath.        Nasal cannula; chronic sob.  Cardiovascular:  Negative for  leg swelling.  Gastrointestinal:  Negative for abdominal pain, constipation,  diarrhea, nausea and vomiting.  Genitourinary:  Negative for bladder incontinence and dysuria.   Musculoskeletal:  Negative for flank pain and neck stiffness.  Skin:  Negative for itching, rash and wound.  Neurological:  Positive for dizziness. Negative for headaches, light-headedness and numbness.  Psychiatric/Behavioral:  Negative for confusion, depression and sleep disturbance. The patient is not nervous/anxious.     VITALS:  Blood pressure 110/71, pulse 78, temperature 98.3 F (36.8 C), weight 177 lb (80.3 kg), SpO2 97 %.  Wt Readings from Last 3 Encounters:  07/02/22 177 lb (80.3 kg)  06/25/22 177 lb (80.3 kg)  06/18/22 176 lb 6.4 oz (80 kg)    Body mass index is 25.4 kg/m.  Performance status (ECOG): 2 - Symptomatic, <50% confined to bed  PHYSICAL EXAM:  Physical Exam Constitutional:      General: He is not in acute distress.    Appearance: He is obese. He is not diaphoretic.  HENT:     Head: Normocephalic and atraumatic.     Nose: Nose normal.     Mouth/Throat:     Pharynx: No oropharyngeal exudate.  Eyes:     General: No scleral icterus.    Pupils: Pupils are equal, round, and reactive to light.  Cardiovascular:     Rate and Rhythm: Normal rate.     Heart sounds: No murmur heard. Pulmonary:     Effort: Pulmonary effort is normal. No respiratory distress.     Comments: Patient is on nasal cannula oxygen Abdominal:     General: There is no distension.     Palpations: Abdomen is soft.     Tenderness: There is no abdominal tenderness.  Musculoskeletal:        General: Normal range of motion.     Cervical back: Normal range of motion and neck supple.  Skin:    General: Skin is warm and dry.     Findings: No erythema.  Neurological:     Mental Status: He is alert and oriented to person, place, and time.     Cranial Nerves: No cranial nerve deficit.     Motor: No abnormal muscle tone.     Coordination: Coordination normal.  Psychiatric:        Mood and Affect:  Mood and affect normal.      LABS:      Latest Ref Rng & Units 07/02/2022   12:07 PM 06/25/2022   11:44 AM 06/18/2022    8:55 AM  CBC  WBC 4.0 - 10.5 K/uL 4.3  6.8  7.7   Hemoglobin 13.0 - 17.0 g/dL 12.0  12.3  12.5   Hematocrit 39.0 - 52.0 % 37.9  38.5  39.7   Platelets 150 - 400 K/uL 214  281  283       Latest Ref Rng & Units 07/02/2022   12:07 PM 06/25/2022   11:44 AM 06/18/2022    8:55 AM  CMP  Glucose 70 - 99 mg/dL 111  112  107   BUN 8 - 23 mg/dL '18  17  16   '$ Creatinine 0.61 - 1.24 mg/dL 0.68  0.69  0.87   Sodium 135 - 145 mmol/L 135  135  137   Potassium 3.5 - 5.1 mmol/L 3.9  4.2  3.9   Chloride 98 - 111 mmol/L 96  95  95   CO2 22 - 32 mmol/L 31  32  32   Calcium 8.9 -  10.3 mg/dL 8.8  9.1  9.1   Total Protein 6.5 - 8.1 g/dL 6.4  6.8  7.6   Total Bilirubin 0.3 - 1.2 mg/dL 0.5  0.6  0.5   Alkaline Phos 38 - 126 U/L 69  65  75   AST 15 - 41 U/L '16  19  19   '$ ALT 0 - 44 U/L '17  18  17      '$ Lab Results  Component Value Date   CEA1 3.4 05/04/2022   Lab Results  Component Value Date   PSA1 3.3 07/27/2017   STUDIES:  No results found.    HISTORY:   Past Medical History:  Diagnosis Date   Allergic rhinitis    Anxiety    Back pain    Cancer (HCC)    prostate   CHF (congestive heart failure) ([place])    Colon polyps 08/2012   colonoscopy; multiple colon polyps; repeat colonoscopy in one year.   Complication of anesthesia    COPD (chronic obstructive pulmonary disease) ([place])    a. on home O2.   Coronary artery disease 11/2009   a. late presenting ant MI; b. LHC 100% pLAD s/p PCI/DES, 99% mRCA s/p PCI/DES, EF 35%; c. nuclear stress test 05/13: prior ant/inf infarcts w/o ischemia, EF 43%; d. LHC 02/14: widely patent stents with no other obs dz, EF 40%; e. LHC 11/17: LM nl, mLAD 10%, patent LAD stent, dLAD 20%, p-mRCA 10%, mRCA 40%, patent RCA stent, EF 35-45%    Depression    Dysphagia    Family history of adverse reaction to anesthesia    GERD (gastroesophageal  reflux disease)    [id number](696.7)    Helicobacter pylori (H. pylori)    Hemorrhoids    Hernia    inguinal   HFrEF (heart failure with reduced ejection fraction) ([place])    a. 10/2016 LV gram: EF 35-45%; b. 06/2018 Echo: EF 40-45%. c. 01/2021 Echo: EF 40-45%   Hyperlipidemia    Hypertension    Insomnia    Ischemic cardiomyopathy    a. 10/2016 LV gram: EF 35-45%; b. 06/2018 Echo: EF 40-45%.   Myalgia    Oxygen dependent    Peptic ulcer    Pulmonary nodules    ST elevation (STEMI) myocardial infarction involving left anterior descending coronary artery ([place]) 11/2009   a. s/p PCI to the LAD   Status post dilation of esophageal narrowing 2000   Thickening of esophagus    Tinea pedis    Wears dentures    partial upper    Past Surgical History:  Procedure Laterality Date    coronary stents     Admission  12/20/2012   COPD exacerbation.  [place].   CARDIAC CATHETERIZATION     CARDIAC CATHETERIZATION  02/05/2013   [facility]   CARDIAC CATHETERIZATION  09/19/2013   [facility] : patent stents with no change in anatomy. EF: 40$   CARDIAC CATHETERIZATION Left 11/12/2016   Procedure: Left Heart Cath and Coronary Angiography;  Surgeon: Wellington Hampshire, MD;  Location: [facility];  Service: Cardiovascular;  Laterality: Left;   COLONOSCOPY     COLONOSCOPY WITH PROPOFOL N/A 05/18/2016   Procedure: COLONOSCOPY WITH PROPOFOL;  Surgeon: Lucilla Lame, MD;  Location: [facility] ENDOSCOPY;  Service: Endoscopy;  Laterality: N/A;   COLONOSCOPY WITH PROPOFOL N/A 03/19/2020   Procedure: COLONOSCOPY WITH PROPOFOL;  Surgeon: Lin Landsman, MD;  Location: [facility] ENDOSCOPY;  Service: Gastroenterology;  Laterality: N/A;   CORONARY ANGIOPLASTY WITH STENT PLACEMENT  09/19/2009   LAD 3.0 X23 mm Xience DES, RCA: 4.0 X 15 mm Xience DES   ELECTROMAGNETIC NAVIGATION BROCHOSCOPY N/A 10/27/2017   Procedure: ELECTROMAGNETIC NAVIGATION BRONCHOSCOPY;  Surgeon: Flora Lipps, MD;  Location: [facility] ORS;  Service: Cardiopulmonary;   Laterality: N/A;   ESOPHAGEAL DILATION     ESOPHAGOGASTRODUODENOSCOPY  12/20/2006   ESOPHAGOGASTRODUODENOSCOPY  08/20/2012   ESOPHAGOGASTRODUODENOSCOPY (EGD) WITH PROPOFOL N/A 04/19/2022   Procedure: ESOPHAGOGASTRODUODENOSCOPY (EGD) WITH PROPOFOL;  Surgeon: [name], MD;  Location: [facility] ENDOSCOPY;  Service: Endoscopy;  Laterality: N/A;   ESOPHAGOGASTRODUODENOSCOPY (EGD) WITH PROPOFOL N/A 04/27/2022   Procedure: ESOPHAGOGASTRODUODENOSCOPY (EGD) WITH PROPOFOL;  Surgeon: [name], MD;  Location: [facility] ENDOSCOPY;  Service: Endoscopy;  Laterality: N/A;   EUS N/A 05/20/2022   Procedure: UPPER ENDOSCOPIC ULTRASOUND (EUS) LINEAR;  Surgeon: Reita Cliche, MD;  Location: [facility] ENDOSCOPY;  Service: Gastroenterology;  Laterality: N/A;  [facility]   HERNIA REPAIR  07/20/2012   L inguinal hernia repair   PENILE PROSTHESIS IMPLANT      Family History  Problem Relation Age of Onset   Heart attack Brother        Brother #1   Diabetes Brother    Hypertension Brother        #3   Coronary artery disease Father 30       deceased   Heart attack Father    Diabetes Father    Heart disease Father    COPD Mother 32       deceased   Alcohol abuse Sister        polysubstance abuse   COPD Sister    Lung cancer Sister    Alcohol abuse Sister        polysubstance abuse   Penile cancer Brother    Diabetes Brother    Prostate cancer Neg Hx    Bladder Cancer Neg Hx    Kidney cancer Neg Hx     Social History:  reports that he quit smoking about 3 years ago. His smoking use included cigarettes. He has a 126.00 pack-year smoking history. He has never used smokeless tobacco. He reports that he does not drink alcohol and does not use drugs.  Allergies:  Allergies  Allergen Reactions   Prozac [Fluoxetine Hcl] Shortness Of Breath   Hydroxyzine    Bupropion Other (See Comments)    "Makes me feel funny" Other reaction(s): Other (See Comments) Unknown/"made me feel real funny" "Makes me  feel funny"   Effexor Xr [Venlafaxine Hcl Er] Other (See Comments)    "Makes me feel funny"    Current Medications: Current Outpatient Medications  Medication Sig Dispense Refill   albuterol (VENTOLIN HFA) 108 (90 Base) MCG/ACT inhaler Inhale 2 puffs into the lungs every 6 (six) hours as needed for wheezing or shortness of breath. 18 g 4   aspirin 81 MG tablet Take 81 mg by mouth daily.     atorvastatin (LIPITOR) 80 MG tablet Take 1 tablet (80 mg total) by mouth daily. 90 tablet 3   budesonide (PULMICORT) 0.5 MG/2ML nebulizer solution Take 2 mLs (0.5 mg total) by nebulization in the morning and at bedtime. 320 mL 12   busPIRone (BUSPAR) 7.5 MG tablet Take by mouth as directed.     docusate sodium (COLACE) 100 MG capsule Take 1 capsule (100 mg total) by mouth 2 (two) times daily. 60 capsule 0   esomeprazole (NEXIUM) 40 MG capsule TAKE ONE CAPSULE BY MOUTH ONCE DAILY 30 capsule 4   fluticasone (FLONASE)  50 MCG/ACT nasal spray Place 2 sprays into both nostrils daily.     furosemide (LASIX) 40 MG tablet TAKE ONE TABLET BY MOUTH ONCE DAILY 90 tablet 2   levalbuterol (XOPENEX) 1.25 MG/3ML nebulizer solution Take 1.25 mg by nebulization every 6 (six) hours as needed for wheezing.     lidocaine-prilocaine (EMLA) cream Apply to affected area once 30 g 3   loratadine (CLARITIN) 10 MG tablet Take 10 mg by mouth daily.     LORazepam (ATIVAN) 1 MG tablet Take 1 tablet (1 mg total) by mouth every 4 (four) hours while awake. 30 tablet 0   losartan (COZAAR) 25 MG tablet Take 25 mg by mouth daily.     mirtazapine (REMERON) 15 MG tablet Take 15 mg by mouth at bedtime.     mirtazapine (REMERON) 45 MG tablet Take by mouth.     Multiple Vitamin (MULTIVITAMIN) capsule Take 1 capsule by mouth daily.     OLANZapine (ZYPREXA) 2.5 MG tablet Take 2.5 mg by mouth at bedtime.     ondansetron (ZOFRAN) 8 MG tablet Take 1 tablet (8 mg total) by mouth 2 (two) times daily as needed for refractory nausea / vomiting. Start on  day 3 after chemo. 30 tablet 1   OXYGEN Inhale 2.5 L into the lungs daily.     polyethylene glycol (MIRALAX) 17 g packet Take 17 g by mouth daily as needed.     prochlorperazine (COMPAZINE) 10 MG tablet Take 1 tablet (10 mg total) by mouth every 6 (six) hours as needed (Nausea or vomiting). 30 tablet 1   Spacer/Aero-Holding Chambers (AEROCHAMBER MV) inhaler Use as instructed 1 each 2   sucralfate (CARAFATE) 1 g tablet Take 1 g by mouth 4 (four) times daily.     amitriptyline (ELAVIL) 25 MG tablet Take 25 mg by mouth at bedtime. 3 tabs at bedtime (Patient not taking: Reported on 06/18/2022)     nitroGLYCERIN (NITROSTAT) 0.4 MG SL tablet Place 1 tablet (0.4 mg total) under the tongue every 5 (five) minutes as needed. (Patient not taking: Reported on 06/18/2022) 25 tablet 3   No current facility-administered medications for this visit.    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 Labs reviewed and discussed with patient. Tolerating treatment well.  Proceed with carboplatin/Taxol.    Weight loss Follow-up with nutritionist.  Recommend nutrition supplementation. Provided patient with ensure today.    Encounter for antineoplastic chemotherapy Chemotherapy plan as listed above.   Anemia due to antineoplastic chemotherapy Hemoglobin is decreased as expected while on treatment.  Monitor.    Prostate cancer ([place]) Gleason score 3+4, status post definitive radiation. Continue follow-up with urology Dr. Diamantina Providence.    PSA is stable.  Disposition: Proceed with carbo-taxol today.  Follow up with Dr Tasia Catchings as scheduled. - la  The patient understands the plans discussed today and is in agreement with them.  He knows to contact our office if he develops concerns prior to his next appointment.  Verlon Au, NP

## 2022-07-05 ENCOUNTER — Other Ambulatory Visit: Payer: Self-pay

## 2022-07-05 ENCOUNTER — Ambulatory Visit
Admission: RE | Admit: 2022-07-05 | Discharge: 2022-07-05 | Disposition: A | Discharge status: Home / Self Care | Payer: Medicare Other | Source: Ambulatory Visit | Attending: Radiation Oncology | Admitting: Radiation Oncology

## 2022-07-05 DIAGNOSIS — Z5111 Encounter for antineoplastic chemotherapy: Secondary | ICD-10-CM | POA: Diagnosis not present

## 2022-07-05 LAB — RAD ONC ARIA SESSION SUMMARY
Course Elapsed Days: 21
Plan Fractions Treated to Date: 15
Plan Prescribed Dose Per Fraction: 1.8 Gy
Plan Total Fractions Prescribed: 30
Plan Total Prescribed Dose: 54 Gy
Reference Point Dosage Given to Date: 27 Gy
Reference Point Session Dosage Given: 1.8 Gy
Session Number: 15

## 2022-07-06 ENCOUNTER — Other Ambulatory Visit: Payer: Self-pay

## 2022-07-06 ENCOUNTER — Ambulatory Visit
Admission: RE | Admit: 2022-07-06 | Discharge: 2022-07-06 | Disposition: A | Discharge status: Home / Self Care | Payer: Medicare Other | Source: Ambulatory Visit | Attending: Radiation Oncology | Admitting: Radiation Oncology

## 2022-07-06 DIAGNOSIS — Z5111 Encounter for antineoplastic chemotherapy: Secondary | ICD-10-CM | POA: Diagnosis not present

## 2022-07-06 LAB — RAD ONC ARIA SESSION SUMMARY
Course Elapsed Days: 22
Plan Fractions Treated to Date: 16
Plan Prescribed Dose Per Fraction: 1.8 Gy
Plan Total Fractions Prescribed: 30
Plan Total Prescribed Dose: 54 Gy
Reference Point Dosage Given to Date: 28.8 Gy
Reference Point Session Dosage Given: 1.8 Gy
Session Number: 16

## 2022-07-07 ENCOUNTER — Ambulatory Visit
Admission: RE | Admit: 2022-07-07 | Discharge: 2022-07-07 | Disposition: A | Discharge status: Home / Self Care | Payer: Medicare Other | Source: Ambulatory Visit | Attending: Radiation Oncology | Admitting: Radiation Oncology

## 2022-07-07 ENCOUNTER — Other Ambulatory Visit: Payer: Self-pay

## 2022-07-07 DIAGNOSIS — Z5111 Encounter for antineoplastic chemotherapy: Secondary | ICD-10-CM | POA: Diagnosis not present

## 2022-07-07 LAB — RAD ONC ARIA SESSION SUMMARY
Course Elapsed Days: 23
Plan Fractions Treated to Date: 17
Plan Prescribed Dose Per Fraction: 1.8 Gy
Plan Total Fractions Prescribed: 30
Plan Total Prescribed Dose: 54 Gy
Reference Point Dosage Given to Date: 30.6 Gy
Reference Point Session Dosage Given: 1.8 Gy
Session Number: 17

## 2022-07-08 ENCOUNTER — Ambulatory Visit
Admission: RE | Admit: 2022-07-08 | Discharge: 2022-07-08 | Disposition: A | Discharge status: Home / Self Care | Payer: Medicare Other | Source: Ambulatory Visit | Attending: Radiation Oncology | Admitting: Radiation Oncology

## 2022-07-08 ENCOUNTER — Inpatient Hospital Stay: Payer: Medicare Other

## 2022-07-08 ENCOUNTER — Inpatient Hospital Stay (HOSPITAL_BASED_OUTPATIENT_CLINIC_OR_DEPARTMENT_OTHER): Payer: Medicare Other | Admitting: Oncology

## 2022-07-08 ENCOUNTER — Other Ambulatory Visit: Payer: Self-pay

## 2022-07-08 ENCOUNTER — Encounter: Payer: Self-pay | Admitting: Oncology

## 2022-07-08 ENCOUNTER — Telehealth: Payer: Self-pay | Admitting: *Deleted

## 2022-07-08 DIAGNOSIS — Z5111 Encounter for antineoplastic chemotherapy: Secondary | ICD-10-CM | POA: Diagnosis not present

## 2022-07-08 DIAGNOSIS — C159 Malignant neoplasm of esophagus, unspecified: Secondary | ICD-10-CM

## 2022-07-08 DIAGNOSIS — R1319 Other dysphagia: Secondary | ICD-10-CM

## 2022-07-08 DIAGNOSIS — D6481 Anemia due to antineoplastic chemotherapy: Secondary | ICD-10-CM

## 2022-07-08 DIAGNOSIS — J449 Chronic obstructive pulmonary disease, unspecified: Secondary | ICD-10-CM

## 2022-07-08 DIAGNOSIS — T451X5A Adverse effect of antineoplastic and immunosuppressive drugs, initial encounter: Secondary | ICD-10-CM

## 2022-07-08 DIAGNOSIS — K208 Other esophagitis without bleeding: Secondary | ICD-10-CM

## 2022-07-08 DIAGNOSIS — T66XXXA Radiation sickness, unspecified, initial encounter: Secondary | ICD-10-CM | POA: Insufficient documentation

## 2022-07-08 DIAGNOSIS — R0902 Hypoxemia: Secondary | ICD-10-CM

## 2022-07-08 DIAGNOSIS — C61 Malignant neoplasm of prostate: Secondary | ICD-10-CM | POA: Diagnosis not present

## 2022-07-08 LAB — CBC WITH DIFFERENTIAL/PLATELET
Abs Immature Granulocytes: 0.03 10*3/uL (ref 0.00–0.07)
Basophils Absolute: 0 10*3/uL (ref 0.0–0.1)
Basophils Relative: 1 %
Eosinophils Absolute: 0 10*3/uL (ref 0.0–0.5)
Eosinophils Relative: 1 %
HCT: 41 % (ref 39.0–52.0)
Hemoglobin: 12.9 g/dL — ABNORMAL LOW (ref 13.0–17.0)
Immature Granulocytes: 1 %
Lymphocytes Relative: 11 %
Lymphs Abs: 0.4 10*3/uL — ABNORMAL LOW (ref 0.7–4.0)
MCH: 28.8 pg (ref 26.0–34.0)
MCHC: 31.5 g/dL (ref 30.0–36.0)
MCV: 91.5 fL (ref 80.0–100.0)
Monocytes Absolute: 0.4 10*3/uL (ref 0.1–1.0)
Monocytes Relative: 12 %
Neutro Abs: 2.7 10*3/uL (ref 1.7–7.7)
Neutrophils Relative %: 74 %
Platelets: 169 10*3/uL (ref 150–400)
RBC: 4.48 MIL/uL (ref 4.22–5.81)
RDW: 14 % (ref 11.5–15.5)
WBC: 3.6 10*3/uL — ABNORMAL LOW (ref 4.0–10.5)
nRBC: 0 % (ref 0.0–0.2)

## 2022-07-08 LAB — COMPREHENSIVE METABOLIC PANEL
ALT: 21 U/L (ref 0–44)
AST: 21 U/L (ref 15–41)
Albumin: 3.9 g/dL (ref 3.5–5.0)
Alkaline Phosphatase: 69 U/L (ref 38–126)
Anion gap: 7 (ref 5–15)
BUN: 17 mg/dL (ref 8–23)
CO2: 33 mmol/L — ABNORMAL HIGH (ref 22–32)
Calcium: 9.5 mg/dL (ref 8.9–10.3)
Chloride: 97 mmol/L — ABNORMAL LOW (ref 98–111)
Creatinine, Ser: 0.85 mg/dL (ref 0.61–1.24)
GFR, Estimated: >60 mL/min (ref >60)
Glucose, Bld: 145 mg/dL — ABNORMAL HIGH (ref 70–99)
Potassium: 4.5 mmol/L (ref 3.5–5.1)
Sodium: 137 mmol/L (ref 135–145)
Total Bilirubin: 0.3 mg/dL (ref 0.3–1.2)
Total Protein: 6.9 g/dL (ref 6.5–8.1)

## 2022-07-08 LAB — RAD ONC ARIA SESSION SUMMARY
Course Elapsed Days: 24
Plan Fractions Treated to Date: 18
Plan Prescribed Dose Per Fraction: 1.8 Gy
Plan Total Fractions Prescribed: 30
Plan Total Prescribed Dose: 54 Gy
Reference Point Dosage Given to Date: 32.4 Gy
Reference Point Session Dosage Given: 1.8 Gy
Session Number: 18

## 2022-07-08 MED FILL — Dexamethasone Sodium Phosphate Inj 100 MG/10ML: INTRAMUSCULAR | Qty: 1 | Status: AC

## 2022-07-08 NOTE — Progress Notes (Signed)
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 Labs reviewed and discussed with patient. Proceed with carboplatin/Taxol  Prostate cancer (HCC) Gleason score 3+4, status post definitive radiation. Continue follow-up with urology Dr. Diamantina [place].     Encounter for antineoplastic chemotherapy Chemotherapy plan as listed above.  Dysphagia Symptom has improved.  Anemia due to antineoplastic chemotherapy Hemoglobin is decreased as expected while on treatment.  Monitor.   Radiation esophagitis Continue PPI and Carafate.  Recommend PRN magic mouth wash.   No orders of the defined types were placed in this encounter.  Follow up  7/28 lab md [place] taxol 8/4 lab md [place] taxol The patient understands the plans discussed today and is in agreement with them.  He knows to contact our office if he develops concerns prior to his next appointment.   [name], MD      CHIEF COMPLAINT:  Chief complaints: follow up for esophageal carcinoma treatments.  History of prostate cancer.  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 showed partially obstructive likely malignant esophageal tumor was found at [place] junction.  Biopsied.  Normal stomach and normal examined duodenum. Biopsy pathology showed fragments of the squamous mucosa with high-grade dysplastic.  Scant gastric mucosa present with reactive changes and no dysplastic or significant intestinal metaplastic.  Fragments of fibrillar purulent ulcer diabetes also present.  04/27/2022, repeat EGD and a biopsy of GE junction partially obstructed ulcerating mass. Repeat biopsy pathology showed superficial and disaggregated fragments of squamous mucosa with at least severe dysplastic/squamous cell carcinoma in situ.  Definite invasion cannot be identified.  In part secondary to specimen fragmentation and tangential sectioning.    04/27/2022 Imaging   MRI abdomen MRCP with and without contrast showed bilateral benign adrenal adenoma.  Small hepatic hemangioma and small right hepatic parenchymal vascular anomalies. Small hiatal hernia.  Colonic diverticulosis.   05/04/2022 Initial Diagnosis   Primary esophageal squamous cell carcinoma  + Stomach discomfort for 4 months, dysphagia for both solids and liquids at times. + Weight loss   05/04/2022 Cancer Staging   Staging form: Esophagus - Squamous Cell Carcinoma, AJCC 8th Edition - Clinical: Stage II (cT3, cN0, cM0, L: Lower) - Signed by [name], MD on 05/31/2022 Stage prefix: Initial diagnosis Total positive nodes: 0   05/09/2022 Imaging   PET scan showed intense tracer uptake associated with the distal esophageal mass just above the level of the GE junction compatible with primary esophageal neoplasm. No signs of tracer avid nodal metastasis or  solid organ metastasis.   05/20/2022 Procedure   EUS by Dr.Spaete.  T3,Nx 05/20/2022, esophagus mass biopsy showed  invasive moderately differentiated squamous cell carcinoma, with basaloid features and minimal keratinization.  Background squamous cell carcinoma in situ.   06/03/2022 -  Chemotherapy   Patient is on Treatment Plan :  ESOPHAGUS Carboplatin/PACLitaxel weekly x 6 weeks with XRT         Patient has multiple comorbidities including COPD/chronic respiratory failure, history of STEMI/combined systolic and diastolic CHF [last LVEF 40 to 45%], lung nodules previously avid on PET scan, biopsy negative and was felt to be calcified granulomas.-Pulmonology Dr. Patsey Berthold Patient lives by himself.  Independent with ADL and some of the iADL  INTERVAL HISTORY:  Aragon is here today for repeat clinical assessment. He denies fevers or chills. He denies pain. His appetite is fair His weight is stable. + Esophageal dysphagia, with solids.  He has no difficulty with pured food and liquids. Some pain with swallowing  + Fatigue and tiredness  +SOB is worse.  Today no nausea vomiting diarrhea.   REVIEW OF SYSTEMS:  Review of Systems  Constitutional:  Positive for appetite change, fatigue and unexpected weight change. Negative for chills and fever.  HENT:   Negative for hearing loss and voice change.   Eyes:  Negative for eye problems and icterus.  Respiratory:  Positive for shortness of breath. Negative for chest tightness and cough.        Chronic respiratory failure on oxygen  Cardiovascular:  Negative for chest pain and leg swelling.  Gastrointestinal:  Positive for constipation. Negative for abdominal distention and abdominal pain.       Dysphagia  Endocrine: Negative for hot flashes.  Genitourinary:  Negative for difficulty urinating, dysuria and frequency.   Musculoskeletal:  Negative for arthralgias.  Skin:  Negative for itching and rash.  Neurological:  Negative for light-headedness and numbness.  Hematological:  Negative for adenopathy. Does not bruise/bleed easily.  Psychiatric/Behavioral:   Negative for confusion.      VITALS:  Blood pressure 127/70, pulse (!) 105, temperature (!) 97 F (36.1 C), resp. rate 20, weight 176 lb 1.6 oz (79.9 kg), SpO2 100 %, peak flow (!) 3 L/min.  Wt Readings from Last 3 Encounters:  07/08/22 176 lb 1.6 oz (79.9 kg)  07/02/22 177 lb (80.3 kg)  06/25/22 177 lb (80.3 kg)    Body mass index is 25.27 kg/m.  Performance status (ECOG): 2 - Symptomatic, <50% confined to bed  PHYSICAL EXAM:  Physical Exam Constitutional:      General: He is not in acute distress.    Appearance: He is obese. He is not diaphoretic.  HENT:     Head: Normocephalic and atraumatic.     Nose: Nose normal.     Mouth/Throat:     Pharynx: No oropharyngeal exudate.  Eyes:     General: No scleral icterus.    Pupils: Pupils are equal, round, and reactive to light.  Cardiovascular:     Rate and Rhythm: Normal rate.     Heart sounds: No murmur heard. Pulmonary:     Effort: Pulmonary effort is normal. No respiratory distress.     Comments: Patient is on nasal cannula oxygen Abdominal:     General: There is no distension.     Palpations: Abdomen is soft.     Tenderness: There is no abdominal tenderness.  Musculoskeletal:        General: Normal range of motion.     Cervical back:  Normal range of motion and neck supple.  Skin:    General: Skin is warm and dry.     Findings: No erythema.  Neurological:     Mental Status: He is alert and oriented to person, place, and time.     Cranial Nerves: No cranial nerve deficit.     Motor: No abnormal muscle tone.     Coordination: Coordination normal.  Psychiatric:        Mood and Affect: Mood and affect normal.      LABS:      Latest Ref Rng & Units 07/08/2022    1:26 PM 07/02/2022   12:07 PM 06/25/2022   11:44 AM  CBC  WBC 4.0 - 10.5 K/uL 3.6  4.3  6.8   Hemoglobin 13.0 - 17.0 g/dL 12.9  12.0  12.3   Hematocrit 39.0 - 52.0 % 41.0  37.9  38.5   Platelets 150 - 400 K/uL 169  214  281       Latest Ref Rng & Units  07/08/2022    1:26 PM 07/02/2022   12:07 PM 06/25/2022   11:44 AM  CMP  Glucose 70 - 99 mg/dL 145  111  112   BUN 8 - 23 mg/dL '17  18  17   '$ Creatinine 0.61 - 1.24 mg/dL 0.85  0.68  0.69   Sodium 135 - 145 mmol/L 137  135  135   Potassium 3.5 - 5.1 mmol/L 4.5  3.9  4.2   Chloride 98 - 111 mmol/L 97  96  95   CO2 22 - 32 mmol/L 33  31  32   Calcium 8.9 - 10.3 mg/dL 9.5  8.8  9.1   Total Protein 6.5 - 8.1 g/dL 6.9  6.4  6.8   Total Bilirubin 0.3 - 1.2 mg/dL 0.3  0.5  0.6   Alkaline Phos 38 - 126 U/L 69  69  65   AST 15 - 41 U/L '21  16  19   '$ ALT 0 - 44 U/L '21  17  18      '$ STUDIES:  NM PET Image Initial (PI) Skull Base To Thigh (F-18 FDG)  Result Date: 05/09/2022 CLINICAL DATA:  Subsequent treatment strategy for esophageal neoplasm. EXAM: NUCLEAR MEDICINE PET SKULL BASE TO THIGH TECHNIQUE: 10.24 mCi F-18 FDG was injected intravenously. Full-ring PET imaging was performed from the skull base to thigh after the radiotracer. CT data was obtained and used for attenuation correction and anatomic localization. Fasting blood glucose: 107 mg/dl COMPARISON:  PET-CT 10/13/2017. CT chest 01/21/2021. CT AP 12/21/2021 and MRI abdomen 04/26/2022 FINDINGS: Mediastinal blood pool activity: SUV max 2.24 Liver activity: SUV max NA NECK: No hypermetabolic lymph nodes in the neck. Incidental CT findings: None CHEST: No tracer avid axillary, supraclavicular, mediastinal, or hilar lymph nodes. Mass involving the distal esophagus measures 5.1 x 3.1 by 4.8 cm and has an SUV max of 12.82, image 133/2. The more proximal portions of the esophagus appear mildly dilated and patulous. Marked narrowing of the esophageal lumen is noted at the level of the tumor. Incidental CT findings: No tracer avid lung nodules identified at this time. Partially calcified nodule within the left upper lobe is again noted and is stable measuring 9 mm, image 84/2. Stable bandlike area of scarring within the anterior right upper lobe. Aortic  atherosclerosis and coronary artery calcifications. Moderate emphysema. ABDOMEN/PELVIS: No abnormal tracer uptake identified within the liver, pancreas, spleen, or adrenal glands. No tracer avid abdominopelvic lymph nodes.  Incidental CT findings: Bilateral adrenal nodules are again noted, previously characterized as benign adenomas. No follow-up recommended. Penile prosthesis is identified. Bilateral fat containing inguinal hernias, right greater than left. SKELETON: No focal hypermetabolic activity to suggest skeletal metastasis. Incidental CT findings: none IMPRESSION: 1. There is intense tracer uptake associated with the distal esophageal mass just above the level of the GE junction compatible with primary esophageal neoplasm. 2. No signs of tracer avid nodal metastasis or solid organ metastasis. 3. Aortic Atherosclerosis (ICD10-I70.0) and Emphysema (ICD10-J43.9). 4. Coronary artery calcifications. Electronically Signed   By: Kerby Moors M.D.   On: 05/09/2022 16:41   MR ABDOMEN MRCP W WO CONTAST  Result Date: 04/27/2022 CLINICAL DATA:  Abnormal findings on CT, adrenal nodules EXAM: MRI ABDOMEN WITHOUT AND WITH CONTRAST (INCLUDING MRCP) TECHNIQUE: Multiplanar multisequence MR imaging of the abdomen was performed both before and after the administration of intravenous contrast. Heavily T2-weighted images of the biliary and pancreatic ducts were obtained, and three-dimensional MRCP images were rendered by post processing. CONTRAST:  56m GADAVIST GADOBUTROL 1 MMOL/ML IV SOLN COMPARISON:  CT abdomen and pelvis 12/21/2021 FINDINGS: Lower chest: No acute findings. Hepatobiliary: Liver is normal in size and contour. There is a 13 mm hyperintense T2 signal lesion in the central right hepatic lobe which demonstrates early discontinuous and gradually increasing postcontrast enhancement, also stable in size since previous ultrasound consistent with a hemangioma. There are a few additional foci of early arterial  parenchymal enhancement in the liver which measure up to 16 mm peripherally in segment 6, with no corresponding signal abnormalities on other sequences likely representing vascular anomalies. Gallbladder appears normal. Common bile duct is upper normal caliber measuring 7 mm with no intraluminal filling defects visualized. Pancreas: Mildly atrophic with no suspicious mass or ductal dilatation identified. Spleen:  Within normal limits in size and appearance. Adrenals/Urinary Tract: Bilateral adrenal nodules which demonstrate signal dropout on out of phase T1 consistent with adenomas measuring 0.9 cm on the right and 2.4 cm on the left. Kidneys appear within normal limits. Stomach/Bowel: Small hiatal hernia. No evidence of bowel obstruction. Colonic diverticulosis. Vascular/Lymphatic: No pathologically enlarged lymph nodes identified. No abdominal aortic aneurysm demonstrated. Other:  No ascites. Musculoskeletal: No suspicious bone lesions identified. IMPRESSION: 1. Bilateral benign adrenal adenomas. 2. Small hepatic hemangioma and small likely hepatic parenchymal vascular anomalies. 3. Small hiatal hernia.  Colonic diverticulosis. Electronically Signed   By: DOfilia NeasM.D.   On: 04/27/2022 08:43      HISTORY:   Past Medical History:  Diagnosis Date   Allergic rhinitis    Anxiety    Back pain    Cancer (HCC)    prostate   [place]    Colon polyps 08/2012   colonoscopy; multiple colon polyps; repeat colonoscopy in one year.   Complication of anesthesia    COPD ([place]ronic obstructive pulmonary disease) ([place]    a. on home O2.   Coronary artery disease 11/2009   a. late presenting ant MI; b. LHC 100% pLAD s/p PCI/DES, 99% mRCA s/p PCI/DES, EF 35%; c. nuclear stress test 05/13: prior ant/inf infarcts w/o ischemia, EF 43%; d. LHC 02/14: widely patent stents with no other obs dz, EF 40%; e. LHC 11/17: LM nl, mLAD 10%, patent LAD stent, dLAD 20%, p-mRCA 10%, mRCA 40%,  patent RCA stent, EF 35-45%    Depression    Dysphagia    Family history of adverse reaction to anesthesia    GERD (gastroesophageal reflux disease)  [id number](536.6)    Helicobacter pylori (H. pylori)    Hemorrhoids    Hernia    inguinal   HFrEF (heart failure with reduced ejection fraction) ([place])    a. 10/2016 LV gram: EF 35-45%; b. 06/2018 Echo: EF 40-45%. c. 01/2021 Echo: EF 40-45%   Hyperlipidemia    Hypertension    Insomnia    Ischemic cardiomyopathy    a. 10/2016 LV gram: EF 35-45%; b. 06/2018 Echo: EF 40-45%.   Myalgia    Oxygen dependent    Peptic ulcer    Pulmonary nodules    ST elevation (STEMI) myocardial infarction involving left anterior descending coronary artery ([place]) 11/2009   a. s/p PCI to the LAD   Status post dilation of esophageal narrowing 2000   Thickening of esophagus    Tinea pedis    Wears dentures    partial upper    Past Surgical History:  Procedure Laterality Date    coronary stents     Admission  12/20/2012   COPD exacerbation.  [place].   CARDIAC CATHETERIZATION     CARDIAC CATHETERIZATION  02/05/2013   [facility]   CARDIAC CATHETERIZATION  09/19/2013   [facility] : patent stents with no change in anatomy. EF: 40$   CARDIAC CATHETERIZATION Left 11/12/2016   Procedure: Left Heart Cath and Coronary Angiography;  Surgeon: Wellington Hampshire, MD;  Location: [facility];  Service: Cardiovascular;  Laterality: Left;   COLONOSCOPY     COLONOSCOPY WITH PROPOFOL N/A 05/18/2016   Procedure: COLONOSCOPY WITH PROPOFOL;  Surgeon: Lucilla Lame, MD;  Location: [facility] ENDOSCOPY;  Service: Endoscopy;  Laterality: N/A;   COLONOSCOPY WITH PROPOFOL N/A 03/19/2020   Procedure: COLONOSCOPY WITH PROPOFOL;  Surgeon: Lin Landsman, MD;  Location[facility] ENDOSCOPY;  Service: Gastroenterology;  Laterality: N/A;   CORONARY ANGIOPLASTY WITH STENT PLACEMENT  09/19/2009   LAD 3.0 X23 mm Xience DES, RCA: 4.0 X 15 mm Xience DES   ELECTROMAGNETIC NAVIGATION BROCHOSCOPY N/A  10/27/2017   Procedure: ELECTROMAGNETIC NAVIGATION BRONCHOSCOPY;  Surgeon: Flora Lipps, MD;  Location: [facility] ORS;  Service: Cardiopulmonary;  Laterality: N/A;   ESOPHAGEAL DILATION     ESOPHAGOGASTRODUODENOSCOPY  12/20/2006   ESOPHAGOGASTRODUODENOSCOPY  08/20/2012   ESOPHAGOGASTRODUODENOSCOPY (EGD) WITH PROPOFOL N/A 04/19/2022   Procedure: ESOPHAGOGASTRODUODENOSCOPY (EGD) WITH PROPOFOL;  Surgeon: [name], MD;  Location: [facility] ENDOSCOPY;  Service: Endoscopy;  Laterality: N/A;   ESOPHAGOGASTRODUODENOSCOPY (EGD) WITH PROPOFOL N/A 04/27/2022   Procedure: ESOPHAGOGASTRODUODENOSCOPY (EGD) WITH PROPOFOL;  Surgeon: [name], MD;  Location: [facility] ENDOSCOPY;  Service: Endoscopy;  Laterality: N/A;   EUS N/A 05/20/2022   Procedure: UPPER ENDOSCOPIC ULTRASOUND (EUS) LINEAR;  Surgeon: Reita Cliche, MD;  Location: [facility] ENDOSCOPY;  Service: Gastroenterology;  Laterality: N/A;  [facility]   HERNIA REPAIR  07/20/2012   L inguinal hernia repair   PENILE PROSTHESIS IMPLANT      Family History  Problem Relation Age of Onset   Heart attack Brother        Brother #1   Diabetes Brother    Hypertension Brother        #3   Coronary artery disease Father 22       deceased   Heart attack Father    Diabetes Father    Heart disease Father    COPD Mother 39       deceased   Alcohol abuse Sister        polysubstance abuse   COPD Sister    Lung cancer Sister  Alcohol abuse Sister        polysubstance abuse   Penile cancer Brother    Diabetes Brother    Prostate cancer Neg Hx    Bladder Cancer Neg Hx    Kidney cancer Neg Hx     Social History:  reports that he quit smoking about 3 years ago. His smoking use included cigarettes. He has a 126.00 pack-year smoking history. He has never used smokeless tobacco. He reports that he does not drink alcohol and does not use drugs.  Allergies:  Allergies  Allergen Reactions   Prozac [Fluoxetine Hcl] Shortness Of Breath   Hydroxyzine     Bupropion Other (See Comments)    "Makes me feel funny" Other reaction(s): Other (See Comments) Unknown/"made me feel real funny" "Makes me feel funny"   Effexor Xr [Venlafaxine Hcl Er] Other (See Comments)    "Makes me feel funny"    Current Medications: Current Outpatient Medications  Medication Sig Dispense Refill   albuterol (VENTOLIN HFA) 108 (90 Base) MCG/ACT inhaler Inhale 2 puffs into the lungs every 6 (six) hours as needed for wheezing or shortness of breath. 18 g 4   aspirin 81 MG tablet Take 81 mg by mouth daily.     atorvastatin (LIPITOR) 80 MG tablet Take 1 tablet (80 mg total) by mouth daily. 90 tablet 3   budesonide (PULMICORT) 0.5 MG/2ML nebulizer solution Take 2 mLs (0.5 mg total) by nebulization in the morning and at bedtime. 320 mL 12   busPIRone (BUSPAR) 7.5 MG tablet Take by mouth as directed.     docusate sodium (COLACE) 100 MG capsule Take 1 capsule (100 mg total) by mouth 2 (two) times daily. 60 capsule 0   esomeprazole (NEXIUM) 40 MG capsule TAKE ONE CAPSULE BY MOUTH ONCE DAILY 30 capsule 4   fluticasone (FLONASE) 50 MCG/ACT nasal spray Place 2 sprays into both nostrils daily.     furosemide (LASIX) 40 MG tablet TAKE ONE TABLET BY MOUTH ONCE DAILY 90 tablet 2   levalbuterol (XOPENEX) 1.25 MG/3ML nebulizer solution Take 1.25 mg by nebulization every 6 (six) hours as needed for wheezing.     lidocaine-prilocaine (EMLA) cream Apply to affected area once 30 g 3   loratadine (CLARITIN) 10 MG tablet Take 10 mg by mouth daily.     LORazepam (ATIVAN) 1 MG tablet Take 1 tablet (1 mg total) by mouth every 4 (four) hours while awake. 30 tablet 0   losartan (COZAAR) 25 MG tablet Take 25 mg by mouth daily.     mirtazapine (REMERON) 15 MG tablet Take 15 mg by mouth at bedtime.     mirtazapine (REMERON) 45 MG tablet Take by mouth.     Multiple Vitamin (MULTIVITAMIN) capsule Take 1 capsule by mouth daily.     OLANZapine (ZYPREXA) 2.5 MG tablet Take 2.5 mg by mouth at bedtime.      ondansetron (ZOFRAN) 8 MG tablet Take 1 tablet (8 mg total) by mouth 2 (two) times daily as needed for refractory nausea / vomiting. Start on day 3 after chemo. 30 tablet 1   OXYGEN Inhale 2.5 L into the lungs daily.     polyethylene glycol (MIRALAX) 17 g packet Take 17 g by mouth daily as needed.     prochlorperazine (COMPAZINE) 10 MG tablet Take 1 tablet (10 mg total) by mouth every 6 (six) hours as needed (Nausea or vomiting). 30 tablet 1   Spacer/Aero-Holding Chambers (AEROCHAMBER MV) inhaler Use as instructed 1 each 2  sucralfate (CARAFATE) 1 g tablet Take 1 g by mouth 4 (four) times daily.     amitriptyline (ELAVIL) 25 MG tablet Take 25 mg by mouth at bedtime. 3 tabs at bedtime (Patient not taking: Reported on 06/18/2022)     nitroGLYCERIN (NITROSTAT) 0.4 MG SL tablet Place 1 tablet (0.4 mg total) under the tongue every 5 (five) minutes as needed. (Patient not taking: Reported on 06/18/2022) 25 tablet 3   No current facility-administered medications for this visit.

## 2022-07-08 NOTE — Assessment & Plan Note (Signed)
Hemoglobin is decreased as expected while on treatment.  Monitor.

## 2022-07-08 NOTE — Assessment & Plan Note (Signed)
Symptom has improved.

## 2022-07-08 NOTE — Assessment & Plan Note (Signed)
Continue PPI and Carafate.  Recommend PRN magic mouth wash.

## 2022-07-08 NOTE — Telephone Encounter (Signed)
[facility] called to states that they do not have any prednisolone in stock and not sure how long it would take to get it in Do you want to send Magic Mouth Wash prescription elsewhere?

## 2022-07-08 NOTE — Assessment & Plan Note (Signed)
Chemotherapy plan as listed above 

## 2022-07-08 NOTE — Progress Notes (Signed)
Patient here for follow up. Reports having increased shortness of breath

## 2022-07-08 NOTE — Assessment & Plan Note (Signed)
Gleason score 3+4, status post definitive radiation. Continue follow-up with urology Dr. Sninsky.    

## 2022-07-08 NOTE — Assessment & Plan Note (Signed)
SOB is worse, could be secondary to chemo and radiation. Close monitor symptoms He is on nasal cannula oxygen.

## 2022-07-08 NOTE — Assessment & Plan Note (Signed)
He is on concurrent chemotherapy carboplatin/Taxol weekly with radiation. Labs reviewed and discussed with patient. Proceed with carboplatin/Taxol

## 2022-07-09 ENCOUNTER — Other Ambulatory Visit: Payer: Self-pay

## 2022-07-09 ENCOUNTER — Inpatient Hospital Stay: Payer: Medicare Other

## 2022-07-09 ENCOUNTER — Other Ambulatory Visit: Payer: Private Health Insurance - Indemnity

## 2022-07-09 ENCOUNTER — Ambulatory Visit: Payer: Private Health Insurance - Indemnity | Admitting: Oncology

## 2022-07-09 ENCOUNTER — Encounter: Payer: Self-pay | Admitting: *Deleted

## 2022-07-09 ENCOUNTER — Emergency Department: Payer: Medicare Other

## 2022-07-09 ENCOUNTER — Ambulatory Visit
Admission: RE | Admit: 2022-07-09 | Discharge: 2022-07-09 | Disposition: A | Discharge status: Home / Self Care | Payer: Medicare Other | Source: Ambulatory Visit | Attending: Radiation Oncology | Admitting: Radiation Oncology

## 2022-07-09 ENCOUNTER — Inpatient Hospital Stay
Admission: EM | Admit: 2022-07-09 | Discharge: 2022-07-12 | DRG: 191 | Disposition: A | Discharge status: Home / Self Care | Payer: Medicare Other | Attending: Internal Medicine | Admitting: Internal Medicine

## 2022-07-09 DIAGNOSIS — Z923 Personal history of irradiation: Secondary | ICD-10-CM

## 2022-07-09 DIAGNOSIS — F32A Depression, unspecified: Secondary | ICD-10-CM | POA: Diagnosis present

## 2022-07-09 DIAGNOSIS — I110 Hypertensive heart disease with heart failure: Secondary | ICD-10-CM | POA: Diagnosis present

## 2022-07-09 DIAGNOSIS — C61 Malignant neoplasm of prostate: Secondary | ICD-10-CM

## 2022-07-09 DIAGNOSIS — Z8249 Family history of ischemic heart disease and other diseases of the circulatory system: Secondary | ICD-10-CM

## 2022-07-09 DIAGNOSIS — I5022 Chronic systolic (congestive) heart failure: Secondary | ICD-10-CM | POA: Diagnosis present

## 2022-07-09 DIAGNOSIS — K219 Gastro-esophageal reflux disease without esophagitis: Secondary | ICD-10-CM | POA: Diagnosis present

## 2022-07-09 DIAGNOSIS — Z87891 Personal history of nicotine dependence: Secondary | ICD-10-CM

## 2022-07-09 DIAGNOSIS — Z8546 Personal history of malignant neoplasm of prostate: Secondary | ICD-10-CM

## 2022-07-09 DIAGNOSIS — Z9981 Dependence on supplemental oxygen: Secondary | ICD-10-CM

## 2022-07-09 DIAGNOSIS — I252 Old myocardial infarction: Secondary | ICD-10-CM

## 2022-07-09 DIAGNOSIS — F341 Dysthymic disorder: Secondary | ICD-10-CM | POA: Diagnosis present

## 2022-07-09 DIAGNOSIS — Z955 Presence of coronary angioplasty implant and graft: Secondary | ICD-10-CM

## 2022-07-09 DIAGNOSIS — Z801 Family history of malignant neoplasm of trachea, bronchus and lung: Secondary | ICD-10-CM

## 2022-07-09 DIAGNOSIS — Z888 Allergy status to other drugs, medicaments and biological substances status: Secondary | ICD-10-CM

## 2022-07-09 DIAGNOSIS — R531 Weakness: Secondary | ICD-10-CM | POA: Diagnosis present

## 2022-07-09 DIAGNOSIS — I2510 Atherosclerotic heart disease of native coronary artery without angina pectoris: Secondary | ICD-10-CM | POA: Diagnosis present

## 2022-07-09 DIAGNOSIS — I255 Ischemic cardiomyopathy: Secondary | ICD-10-CM | POA: Diagnosis present

## 2022-07-09 DIAGNOSIS — E785 Hyperlipidemia, unspecified: Secondary | ICD-10-CM | POA: Diagnosis present

## 2022-07-09 DIAGNOSIS — Z825 Family history of asthma and other chronic lower respiratory diseases: Secondary | ICD-10-CM

## 2022-07-09 DIAGNOSIS — Z7951 Long term (current) use of inhaled steroids: Secondary | ICD-10-CM

## 2022-07-09 DIAGNOSIS — J441 Chronic obstructive pulmonary disease with (acute) exacerbation: Principal | ICD-10-CM | POA: Diagnosis present

## 2022-07-09 DIAGNOSIS — Z7982 Long term (current) use of aspirin: Secondary | ICD-10-CM

## 2022-07-09 DIAGNOSIS — Z79899 Other long term (current) drug therapy: Secondary | ICD-10-CM

## 2022-07-09 DIAGNOSIS — Z9221 Personal history of antineoplastic chemotherapy: Secondary | ICD-10-CM

## 2022-07-09 DIAGNOSIS — F419 Anxiety disorder, unspecified: Secondary | ICD-10-CM | POA: Diagnosis present

## 2022-07-09 DIAGNOSIS — C159 Malignant neoplasm of esophagus, unspecified: Secondary | ICD-10-CM | POA: Diagnosis present

## 2022-07-09 DIAGNOSIS — I10 Essential (primary) hypertension: Secondary | ICD-10-CM | POA: Diagnosis present

## 2022-07-09 DIAGNOSIS — R079 Chest pain, unspecified: Secondary | ICD-10-CM

## 2022-07-09 DIAGNOSIS — Z5111 Encounter for antineoplastic chemotherapy: Secondary | ICD-10-CM | POA: Diagnosis not present

## 2022-07-09 LAB — CBC WITH DIFFERENTIAL/PLATELET
Abs Immature Granulocytes: 0.04 10*3/uL (ref 0.00–0.07)
Basophils Absolute: 0 10*3/uL (ref 0.0–0.1)
Basophils Relative: 1 %
Eosinophils Absolute: 0 10*3/uL (ref 0.0–0.5)
Eosinophils Relative: 1 %
HCT: 42.6 % (ref 39.0–52.0)
Hemoglobin: 13.3 g/dL (ref 13.0–17.0)
Immature Granulocytes: 1 %
Lymphocytes Relative: 7 %
Lymphs Abs: 0.4 10*3/uL — ABNORMAL LOW (ref 0.7–4.0)
MCH: 28.3 pg (ref 26.0–34.0)
MCHC: 31.2 g/dL (ref 30.0–36.0)
MCV: 90.6 fL (ref 80.0–100.0)
Monocytes Absolute: 0.7 10*3/uL (ref 0.1–1.0)
Monocytes Relative: 13 %
Neutro Abs: 4 10*3/uL (ref 1.7–7.7)
Neutrophils Relative %: 77 %
Platelets: 179 10*3/uL (ref 150–400)
RBC: 4.7 MIL/uL (ref 4.22–5.81)
RDW: 14.3 % (ref 11.5–15.5)
WBC: 5.2 10*3/uL (ref 4.0–10.5)
nRBC: 0 % (ref 0.0–0.2)

## 2022-07-09 LAB — COMPREHENSIVE METABOLIC PANEL
ALT: 23 U/L (ref 0–44)
AST: 30 U/L (ref 15–41)
Albumin: 4.1 g/dL (ref 3.5–5.0)
Alkaline Phosphatase: 76 U/L (ref 38–126)
Anion gap: 11 (ref 5–15)
BUN: 17 mg/dL (ref 8–23)
CO2: 29 mmol/L (ref 22–32)
Calcium: 9.5 mg/dL (ref 8.9–10.3)
Chloride: 98 mmol/L (ref 98–111)
Creatinine, Ser: 0.83 mg/dL (ref 0.61–1.24)
GFR, Estimated: >60 mL/min (ref >60)
Glucose, Bld: 121 mg/dL — ABNORMAL HIGH (ref 70–99)
Potassium: 3.8 mmol/L (ref 3.5–5.1)
Sodium: 138 mmol/L (ref 135–145)
Total Bilirubin: 0.7 mg/dL (ref 0.3–1.2)
Total Protein: 7.5 g/dL (ref 6.5–8.1)

## 2022-07-09 LAB — RAD ONC ARIA SESSION SUMMARY
Course Elapsed Days: 25
Plan Fractions Treated to Date: 19
Plan Prescribed Dose Per Fraction: 1.8 Gy
Plan Total Fractions Prescribed: 30
Plan Total Prescribed Dose: 54 Gy
Reference Point Dosage Given to Date: 34.2 Gy
Reference Point Session Dosage Given: 1.8 Gy
Session Number: 19

## 2022-07-09 LAB — TROPONIN I (HIGH SENSITIVITY)
Troponin I (High Sensitivity): 7 ng/L (ref <18)
Troponin I (High Sensitivity): 8 ng/L (ref <18)

## 2022-07-09 LAB — BRAIN NATRIURETIC PEPTIDE: B Natriuretic Peptide: 22.2 pg/mL (ref 0.0–100.0)

## 2022-07-09 MED ORDER — BUDESONIDE 0.5 MG/2ML IN SUSP
0.5000 mg | Freq: Two times a day (BID) | RESPIRATORY_TRACT | Status: DC
  Administered 2022-07-10 – 2022-07-12 (×5): 0.5 mg via RESPIRATORY_TRACT
  Filled 2022-07-09 (×5): qty 2

## 2022-07-09 MED ORDER — PANTOPRAZOLE SODIUM 40 MG IV SOLR
40.0000 mg | Freq: Once | INTRAVENOUS | Status: AC
  Administered 2022-07-09: 40 mg via INTRAVENOUS
  Filled 2022-07-09: qty 10

## 2022-07-09 MED ORDER — ASPIRIN 81 MG PO TBEC
81.0000 mg | DELAYED_RELEASE_TABLET | Freq: Every day | ORAL | Status: DC
  Administered 2022-07-10 – 2022-07-12 (×3): 81 mg via ORAL
  Filled 2022-07-09 (×3): qty 1

## 2022-07-09 MED ORDER — PANTOPRAZOLE SODIUM 40 MG PO TBEC
40.0000 mg | DELAYED_RELEASE_TABLET | Freq: Every day | ORAL | Status: DC
  Administered 2022-07-09 – 2022-07-12 (×4): 40 mg via ORAL
  Filled 2022-07-09 (×4): qty 1

## 2022-07-09 MED ORDER — PREDNISONE 20 MG PO TABS
40.0000 mg | ORAL_TABLET | Freq: Every day | ORAL | Status: DC
Start: 2022-07-10 — End: 2022-07-10
  Administered 2022-07-10: 40 mg via ORAL
  Filled 2022-07-09: qty 2

## 2022-07-09 MED ORDER — ENOXAPARIN SODIUM 40 MG/0.4ML IJ SOSY
40.0000 mg | PREFILLED_SYRINGE | INTRAMUSCULAR | Status: DC
  Administered 2022-07-09 – 2022-07-11 (×3): 40 mg via SUBCUTANEOUS
  Filled 2022-07-09 (×3): qty 0.4

## 2022-07-09 MED ORDER — MORPHINE SULFATE (PF) 4 MG/ML IV SOLN
4.0000 mg | Freq: Once | INTRAVENOUS | Status: AC
  Administered 2022-07-09: 4 mg via INTRAVENOUS
  Filled 2022-07-09: qty 1

## 2022-07-09 MED ORDER — IPRATROPIUM-ALBUTEROL 0.5-2.5 (3) MG/3ML IN SOLN
3.0000 mL | Freq: Once | RESPIRATORY_TRACT | Status: AC
Start: 2022-07-09 — End: 2022-07-09
  Administered 2022-07-09: 3 mL via RESPIRATORY_TRACT
  Filled 2022-07-09: qty 3

## 2022-07-09 MED ORDER — FUROSEMIDE 40 MG PO TABS
40.0000 mg | ORAL_TABLET | Freq: Every day | ORAL | Status: DC
Start: 2022-07-09 — End: 2022-07-12
  Administered 2022-07-09 – 2022-07-12 (×4): 40 mg via ORAL
  Filled 2022-07-09 (×4): qty 1

## 2022-07-09 MED ORDER — IPRATROPIUM-ALBUTEROL 0.5-2.5 (3) MG/3ML IN SOLN
3.0000 mL | Freq: Four times a day (QID) | RESPIRATORY_TRACT | Status: DC
  Administered 2022-07-09 – 2022-07-10 (×3): 3 mL via RESPIRATORY_TRACT
  Filled 2022-07-09 (×3): qty 3

## 2022-07-09 MED ORDER — ADULT MULTIVITAMIN W/MINERALS CH
1.0000 | ORAL_TABLET | Freq: Every day | ORAL | Status: DC
  Administered 2022-07-10 – 2022-07-12 (×3): 1 via ORAL
  Filled 2022-07-09 (×3): qty 1

## 2022-07-09 MED ORDER — IOHEXOL 350 MG/ML SOLN
75.0000 mL | Freq: Once | INTRAVENOUS | Status: AC | PRN
  Administered 2022-07-09: 75 mL via INTRAVENOUS

## 2022-07-09 MED ORDER — ATORVASTATIN CALCIUM 20 MG PO TABS
80.0000 mg | ORAL_TABLET | Freq: Every evening | ORAL | Status: DC
  Administered 2022-07-09 – 2022-07-11 (×3): 80 mg via ORAL
  Filled 2022-07-09 (×3): qty 4

## 2022-07-09 MED ORDER — PROCHLORPERAZINE EDISYLATE 10 MG/2ML IJ SOLN: 10.0000 mg | Freq: Four times a day (QID) | INTRAMUSCULAR | Status: DC | PRN

## 2022-07-09 MED ORDER — AZITHROMYCIN 250 MG PO TABS
250.0000 mg | ORAL_TABLET | Freq: Every day | ORAL | Status: DC
  Administered 2022-07-10 – 2022-07-12 (×3): 250 mg via ORAL
  Filled 2022-07-09 (×3): qty 1

## 2022-07-09 MED ORDER — AZITHROMYCIN 250 MG PO TABS
500.0000 mg | ORAL_TABLET | Freq: Every day | ORAL | Status: AC
  Administered 2022-07-09: 500 mg via ORAL
  Filled 2022-07-09: qty 2

## 2022-07-09 MED ORDER — PREDNISONE 20 MG PO TABS
60.0000 mg | ORAL_TABLET | Freq: Once | ORAL | Status: AC
  Administered 2022-07-09: 60 mg via ORAL
  Filled 2022-07-09: qty 3

## 2022-07-09 MED ORDER — POLYETHYLENE GLYCOL 3350 17 G PO PACK: 17.0000 g | PACK | Freq: Every day | ORAL | Status: DC | PRN

## 2022-07-09 MED ORDER — MIRTAZAPINE 15 MG PO TABS
45.0000 mg | ORAL_TABLET | Freq: Every day | ORAL | Status: DC
  Administered 2022-07-09 – 2022-07-11 (×3): 45 mg via ORAL
  Filled 2022-07-09 (×3): qty 3

## 2022-07-09 MED ORDER — SUCRALFATE 1 G PO TABS
1.0000 g | ORAL_TABLET | Freq: Two times a day (BID) | ORAL | Status: DC
  Administered 2022-07-10 – 2022-07-12 (×5): 1 g via ORAL
  Filled 2022-07-09 (×5): qty 1

## 2022-07-09 MED ORDER — LOSARTAN POTASSIUM 50 MG PO TABS
25.0000 mg | ORAL_TABLET | Freq: Every day | ORAL | Status: DC
  Administered 2022-07-10 – 2022-07-12 (×3): 25 mg via ORAL
  Filled 2022-07-09 (×3): qty 1

## 2022-07-09 NOTE — ED Notes (Signed)
Patient c/o increased pain in chest. Repeat EKG captured. MD made aware. No new orders

## 2022-07-09 NOTE — Progress Notes (Signed)
Patient here for Radiation Treatment with complaints of right upper quadrant chest pain and worsening wheezing and SOB.   Unable to be seen in [facility] today, patient advised to go to the ED.

## 2022-07-09 NOTE — ED Triage Notes (Signed)
Pt brought [facility] from [facility] with c/o chest pain SOB over the past week, pt is currently getting radiation and chemo for esophageal cancer. Pt is 3L [place] at home continuous

## 2022-07-09 NOTE — H&P (Addendum)
History and Physical  Ace Bergfeld [facility] FVC:[id number] DOB: 02-22-1951 DOA: 07/09/2022  Referring physician: Dr. Cherylann Banas, Erda PCP: [name], MD  Outpatient Specialists: Medical oncology Patient coming from: Home.  Chief Complaint: Shortness of breath  HPI: Leslie Sims is a 71 y.o. male with medical history significant for esophageal cancer with ongoing chemotherapy and radiation, prostate cancer, esophageal dysphagia, anemia of chronic disease, radiation esophagitis, COPD with hypoxia on 3L [place] continuously who presented to [facility] ED with complaints of non exertional dyspnea.  Associated with audible wheezing.  Also endorses generalized weakness, progressive for about a week.  Denies subjective fevers.  Denies a productive cough.  States depending on what he eats he may have spontaneous emesis without nausea.  Denies aspiration.  Currently ongoing chemotherapy Monday through Friday and radiation on Fridays.  He had chemotherapy today but because of his symptomatology his radiation was canceled today.  He was advised to go to the ED for further evaluation.    In the ED, he was noted to have audible wheezing improved with DuoNebs breathing treatment but still dyspneic, worse with ambulation.  Chest x-ray nonacute.  TR[facility], hospitalist service, was asked to admit for acute COPD exacerbation.  ED Course: Tmax 98.5.  BP 120/76, pulse 80, respiratory 12, with saturation 97% on 3 L.  CBC and CMP essentially unremarkable.  High-sensitivity troponin 8.  BNP 22.  Review of Systems: Review of systems as noted in the HPI. All other systems reviewed and are negative.   Past Medical History:  Diagnosis Date   Allergic rhinitis    Anxiety    Back pain    Cancer (HCC)    prostate   CHF (congestive heart failure) ([place])    Colon polyps 08/2012   colonoscopy; multiple colon polyps; repeat colonoscopy in one year.   Complication of anesthesia    COPD (chronic obstructive pulmonary disease)  ([place])    a. on home O2.   Coronary artery disease 11/2009   a. late presenting ant MI; b. LHC 100% pLAD s/p PCI/DES, 99% mRCA s/p PCI/DES, EF 35%; c. nuclear stress test 05/13: prior ant/inf infarcts w/o ischemia, EF 43%; d. LHC 02/14: widely patent stents with no other obs dz, EF 40%; e. LHC 11/17: LM nl, mLAD 10%, patent LAD stent, dLAD 20%, p-mRCA 10%, mRCA 40%, patent RCA stent, EF 35-45%    Depression    Dysphagia    Family history of adverse reaction to anesthesia    GERD (gastroesophageal reflux disease)    [id number](935.7)    Helicobacter pylori (H. pylori)    Hemorrhoids    Hernia    inguinal   HFrEF (heart failure with reduced ejection fraction) ([place])    a. 10/2016 LV gram: EF 35-45%; b. 06/2018 Echo: EF 40-45%. c. 01/2021 Echo: EF 40-45%   Hyperlipidemia    Hypertension    Insomnia    Ischemic cardiomyopathy    a. 10/2016 LV gram: EF 35-45%; b. 06/2018 Echo: EF 40-45%.   Myalgia    Oxygen dependent    Peptic ulcer    Pulmonary nodules    ST elevation (STEMI) myocardial infarction involving left anterior descending coronary artery ([place]) 11/2009   a. s/p PCI to the LAD   Status post dilation of esophageal narrowing 2000   Thickening of esophagus    Tinea pedis    Wears dentures    partial upper   Past Surgical History:  Procedure Laterality Date    coronary stents  Admission  12/20/2012   COPD exacerbation.  [place].   CARDIAC CATHETERIZATION     CARDIAC CATHETERIZATION  02/05/2013   [facility]   CARDIAC CATHETERIZATION  09/19/2013   [facility] : patent stents with no change in anatomy. EF: 40$   CARDIAC CATHETERIZATION Left 11/12/2016   Procedure: Left Heart Cath and Coronary Angiography;  Surgeon: Wellington Hampshire, MD;  Location: [place] [facility];  Service: Cardiovascular;  Laterality: Left;   COLONOSCOPY     COLONOSCOPY WITH PROPOFOL N/A 05/18/2016   Procedure: COLONOSCOPY WITH PROPOFOL;  Surgeon: Lucilla Lame, MD;  Location: [facility] ENDOSCOPY;  Service: Endoscopy;   Laterality: N/A;   COLONOSCOPY WITH PROPOFOL N/A 03/19/2020   Procedure: COLONOSCOPY WITH PROPOFOL;  Surgeon: Lin Landsman, MD;  Location: [facility] ENDOSCOPY;  Service: Gastroenterology;  Laterality: N/A;   CORONARY ANGIOPLASTY WITH STENT PLACEMENT  09/19/2009   LAD 3.0 X23 mm Xience DES, RCA: 4.0 X 15 mm Xience DES   ELECTROMAGNETIC NAVIGATION BROCHOSCOPY N/A 10/27/2017   Procedure: ELECTROMAGNETIC NAVIGATION BRONCHOSCOPY;  Surgeon: Flora Lipps, MD;  Location: [facility] ORS;  Service: Cardiopulmonary;  Laterality: N/A;   ESOPHAGEAL DILATION     ESOPHAGOGASTRODUODENOSCOPY  12/20/2006   ESOPHAGOGASTRODUODENOSCOPY  08/20/2012   ESOPHAGOGASTRODUODENOSCOPY (EGD) WITH PROPOFOL N/A 04/19/2022   Procedure: ESOPHAGOGASTRODUODENOSCOPY (EGD) WITH PROPOFOL;  Surgeon: [name], MD;  Location: [facility] ENDOSCOPY;  Service: Endoscopy;  Laterality: N/A;   ESOPHAGOGASTRODUODENOSCOPY (EGD) WITH PROPOFOL N/A 04/27/2022   Procedure: ESOPHAGOGASTRODUODENOSCOPY (EGD) WITH PROPOFOL;  Surgeon: [name], MD;  Location: [facility] ENDOSCOPY;  Service: Endoscopy;  Laterality: N/A;   EUS N/A 05/20/2022   Procedure: UPPER ENDOSCOPIC ULTRASOUND (EUS) LINEAR;  Surgeon: Reita Cliche, MD;  Location: [facility] ENDOSCOPY;  Service: Gastroenterology;  Laterality: N/A;  [facility]   HERNIA REPAIR  07/20/2012   L inguinal hernia repair   PENILE PROSTHESIS IMPLANT      Social History:  reports that he quit smoking about 3 years ago. His smoking use included cigarettes. He has a 126.00 pack-year smoking history. He has never used smokeless tobacco. He reports that he does not drink alcohol and does not use drugs.   Allergies  Allergen Reactions   Prozac [Fluoxetine Hcl] Shortness Of Breath   Hydroxyzine    Bupropion Other (See Comments)    "Makes me feel funny" Other reaction(s): Other (See Comments) Unknown/"made me feel real funny" "Makes me feel funny"   Effexor Xr [Venlafaxine Hcl Er] Other (See Comments)     "Makes me feel funny"    Family History  Problem Relation Age of Onset   Heart attack Brother        Brother #1   Diabetes Brother    Hypertension Brother        #3   Coronary artery disease Father 53       deceased   Heart attack Father    Diabetes Father    Heart disease Father    COPD Mother 29       deceased   Alcohol abuse Sister        polysubstance abuse   COPD Sister    Lung cancer Sister    Alcohol abuse Sister        polysubstance abuse   Penile cancer Brother    Diabetes Brother    Prostate cancer Neg Hx    Bladder Cancer Neg Hx    Kidney cancer Neg Hx       Prior to Admission medications   Medication Sig Start Date  End Date Taking? Authorizing Provider  albuterol (VENTOLIN HFA) 108 (90 Base) MCG/ACT inhaler Inhale 2 puffs into the lungs every 6 (six) hours as needed for wheezing or shortness of breath. 04/29/22  Yes [name], MD  aspirin 81 MG tablet Take 81 mg by mouth daily.   Yes [provider]  atorvastatin (LIPITOR) 80 MG tablet Take 1 tablet (80 mg total) by mouth daily. 03/18/21 07/09/22 Yes Dunn, Areta Haber, PA-C  azithromycin (ZITHROMAX) 250 MG tablet Take 250 mg by mouth 3 (three) times a week. Pt takes it on Mon,Wed,Fridays   Yes [provider]  budesonide (PULMICORT) 0.5 MG/2ML nebulizer solution Take 2 mLs (0.5 mg total) by nebulization in the morning and at bedtime. 05/24/22  Yes [name], MD  docusate sodium (COLACE) 100 MG capsule Take 1 capsule (100 mg total) by mouth 2 (two) times daily. 06/18/22  Yes [name], MD  esomeprazole (NEXIUM) 40 MG capsule TAKE ONE CAPSULE BY MOUTH ONCE DAILY 09/04/21  Yes [name], MD  fluticasone [facility]) 50 MCG/ACT nasal spray Place 2 sprays into both nostrils daily. 06/08/21  Yes [provider]  furosemide (LASIX) 40 MG tablet TAKE ONE TABLET BY MOUTH ONCE DAILY 01/25/22  Yes Theora Gianotti, NP  ipratropium-albuterol (DUONEB) 0.5-2.5 (3) MG/3ML SOLN Take 3  mLs by nebulization 2 (two) times daily.   Yes [provider]  lidocaine-prilocaine (EMLA) cream Apply to affected area once 05/31/22  Yes [name], MD  loratadine (CLARITIN) 10 MG tablet Take 10 mg by mouth daily. 06/08/21  Yes [provider]  LORazepam (ATIVAN) 1 MG tablet Take 1 tablet (1 mg total) by mouth every 4 (four) hours while awake. Patient taking differently: Take 1 mg by mouth 3 (three) times daily. 02/14/19  Yes [name], MD  losartan (COZAAR) 25 MG tablet Take 25 mg by mouth daily. 02/23/19  Yes [provider]  mirtazapine (REMERON) 45 MG tablet Take 45 mg by mouth at bedtime. 06/29/22  Yes [provider]  Multiple Vitamin (MULTIVITAMIN) capsule Take 1 capsule by mouth daily.   Yes [provider]  polyethylene glycol (MIRALAX) 17 g packet Take 17 g by mouth daily as needed.   Yes [provider]  sucralfate (CARAFATE) 1 g tablet Take 1 g by mouth 2 (two) times daily with a meal. 06/03/20  Yes [provider]  amitriptyline (ELAVIL) 25 MG tablet Take 25 mg by mouth at bedtime. 3 tabs at bedtime Patient not taking: Reported on 06/18/2022    [provider]  busPIRone (BUSPAR) 7.5 MG tablet Take by mouth as directed. Patient not taking: Reported on 07/09/2022 01/18/22   [provider]  nitroGLYCERIN (NITROSTAT) 0.4 MG SL tablet Place 1 tablet (0.4 mg total) under the tongue every 5 (five) minutes as needed. Patient not taking: Reported on 07/09/2022 06/04/20   Loel Dubonnet, NP  ondansetron (ZOFRAN) 8 MG tablet Take 1 tablet (8 mg total) by mouth 2 (two) times daily as needed for refractory nausea / vomiting. Start on day 3 after chemo. Patient not taking: Reported on 07/09/2022 05/31/22   [name], MD  OXYGEN Inhale 2.5 L into the lungs daily.    [provider]  prochlorperazine (COMPAZINE) 10 MG tablet Take 1 tablet (10 mg total) by mouth every 6 (six) hours as needed (Nausea or vomiting).  05/31/22   [name], MD  Spacer/Aero-Holding Chambers (AEROCHAMBER MV) inhaler Use as instructed 05/04/21   Margarette Canada,  NP    Physical Exam: BP 120/79   Pulse 82   Temp 98.5 F (36.9 C) (Oral)   Resp 19   Ht '5\' 10"'$  (1.778 m)   Wt 84.8 kg   SpO2 98%   BMI 26.83 kg/m   General: 71 y.o. year-old male well developed well nourished in no acute distress.  Alert and oriented x3. Cardiovascular: Regular rate and rhythm with no rubs or gallops.  No thyromegaly or JVD noted.  No lower extremity edema. 2/4 pulses in all 4 extremities. Respiratory: Clear to auscultation with no wheezes or rales. Good inspiratory effort. Abdomen: Soft nontender nondistended with normal bowel sounds x4 quadrants. Muskuloskeletal: No cyanosis, clubbing or edema noted bilaterally Neuro: CN II-XII intact, strength, sensation, reflexes Skin: No ulcerative lesions noted or rashes Psychiatry: Judgement and insight appear normal. Mood is appropriate for condition and setting          Labs on Admission:  Basic Metabolic Panel: Recent Labs  Lab 07/08/22 1326 07/09/22 0942  NA 137 138  K 4.5 3.8  CL 97* 98  CO2 33* 29  GLUCOSE 145* 121*  BUN 17 17  CREATININE 0.85 0.83  CALCIUM 9.5 9.5   Liver Function Tests: Recent Labs  Lab 07/08/22 1326 07/09/22 0942  AST 21 30  ALT 21 23  ALKPHOS 69 76  BILITOT 0.3 0.7  PROT 6.9 7.5  ALBUMIN 3.9 4.1   No results for input(s): "LIPASE", "AMYLASE" in the last 168 hours. No results for input(s): "AMMONIA" in the last 168 hours. CBC: Recent Labs  Lab 07/08/22 1326 07/09/22 0942  WBC 3.6* 5.2  NEUTROABS 2.7 4.0  HGB 12.9* 13.3  HCT 41.0 42.6  MCV 91.5 90.6  PLT 169 179   Cardiac Enzymes: No results for input(s): "CKTOTAL", "CKMB", "CKMBINDEX", "TROPONINI" in the last 168 hours.  BNP (last 3 results) Recent Labs    10/19/21 1526 07/09/22 0942  BNP 30.1 22.2    ProBNP (last 3 results) No results for input(s): "PROBNP" in the last 8760  hours.  CBG: No results for input(s): "GLUCAP" in the last 168 hours.  Radiological Exams on Admission: DG Chest Port 1 View  Result Date: 07/09/2022 CLINICAL DATA:  Chest pain, shortness of breath over the past week EXAM: PORTABLE CHEST 1 VIEW COMPARISON:  03/15/2022, 07/09/2022 FINDINGS: 7 mm right upper lobe pulmonary nodule. Faintly visualized 9 mm left upper lobe pulmonary nodule. Bilateral upper lobe pulmonary nodules were better characterized on the CT of the chest dated 07/09/2022 performed earlier same day. Bilateral mild interstitial thickening. Bilateral emphysematous changes. No focal consolidation. No pleural effusion or pneumothorax. Heart and mediastinal contours are unremarkable. No acute osseous abnormality. IMPRESSION: 1. No acute cardiopulmonary disease. Electronically Signed   By: Kathreen Devoid M.D.   On: 07/09/2022 10:40   CT Angio Chest PE W and/or Wo Contrast  Result Date: 07/09/2022 CLINICAL DATA:  Pulmonary embolism high probability, shortness of breath and chest pain. History of esophageal neoplasm. * Tracking Code: BO * EXAM: CT ANGIOGRAPHY CHEST WITH CONTRAST TECHNIQUE: Multidetector CT imaging of the chest was performed using the standard protocol during bolus administration of intravenous contrast. Multiplanar CT image reconstructions and MIPs were obtained to evaluate the vascular anatomy. RADIATION DOSE REDUCTION: This exam was performed according to the departmental dose-optimization program which includes automated exposure control, adjustment of the mA and/or kV according to patient size and/or use of iterative reconstruction technique. CONTRAST:  3m OMNIPAQUE IOHEXOL 350 MG/ML SOLN COMPARISON:  PET-CT May 07, 2022. FINDINGS: Cardiovascular: Satisfactory opacification of the pulmonary arteries to the segmental level. No evidence of pulmonary embolism. Aortic atherosclerosis. Normal heart size. No pericardial effusion. Mediastinum/Nodes: No suspicious thyroid nodule. No  pathologically enlarged mediastinal, hilar or axillary lymph nodes. Stable calcified 15 mm nodule in the right axilla. Marked distal esophageal wall thickening with patulous gas and fluid filled proximal esophagus similar prior PET-CT. Lungs/Pleura: Stable 9 mm left upper lobe pulmonary nodule which was not hypermetabolic on prior PET-CT. No new suspicious pulmonary nodules or masses. Bilateral calcified granulomata. Stable upper lobe predominant paraseptal and centrilobular emphysematous change with multifocal scarring including a bandlike area in the right upper lobe. Upper Abdomen: No acute abnormality.  Calcified splenic granulomata. Musculoskeletal: No aggressive lytic or blastic lesion of bone. Multilevel degenerative changes spine. Review of the MIP images confirms the above findings. IMPRESSION: 1. No evidence of pulmonary embolus. 2. Similar appearance of the masslike distal esophageal wall thickening consistent with patient's known esophageal carcinoma. 3. Similar patulous appearance of the proximal esophagus with fluid and gas filling the esophagus, almost certainly reflecting a measure of distal esophageal obstruction, quantification of which can not be determined on this examination. 4. No evidence of metastatic disease in the chest. 5. Stable emphysematous change and multifocal pulmonary scarring. 6. Aortic Atherosclerosis (ICD10-I70.0) and Emphysema (ICD10-J43.9). Electronically Signed   By: Dahlia Bailiff M.D.   On: 07/09/2022 10:22    EKG: I independently viewed the EKG done and my findings are as followed: Sinus rhythm rate of 89.  Nonspecific ST-T changes.  QTc 427  Assessment/Plan Present on Admission:  COPD exacerbation (HCC)  Active Problems:   COPD exacerbation (HCC)  Acute COPD exacerbation, unclear trigger Chest x-ray non acute Ongoing chemotherapy and radiation for esophageal cancer Radiation session canceled today 07/09/22 due to dyspnea Maintain O2 sat with greater than  90% DuoNebs every 6 hours Prednisone 40 mg x 5 days. Z-Pak x5 days Resume home bronchodilators PT assessment in the AM.  Esophageal cancer with ongoing chemotherapy and radiation Management per medical/radiation oncology  Chronic anxiety/depression Resume home regimen  GERD Resume home PPI  Hyperlipidemia Resume home regimen.  Generalized weakness in the setting of ongoing chemotherapy and radiation PT assessment Encourage protein calorie intake as tolerated Currently on soft diet, can advance as tolerated with aspiration precautions.    DVT prophylaxis: Subcu Lovenox daily  Code Status: Full code, per the patient himself  Family Communication: None at bedside.  Disposition Plan: Admitted to telemetry medical unit  Consults called: None.  Admission status: Observation status.   Status is: Observation    Kayleen Memos MD [facility] Pager [phone]  If 7PM-7AM, please contact night-coverage [email or web address] Password [facility]  07/09/2022, 5:56 PM

## 2022-07-09 NOTE — ED Provider Notes (Signed)
[facility] Provider Note    Event Date/Time   First MD Initiated Contact with Patient 07/09/22 [phone]     (approximate)   History   Chest Pain and Shortness of Breath   HPI  Dartanion Teo is a 71 y.o. male with history of stage II esophageal cancer receiving chemotherapy and radiation as well as COPD on 3 L home O2 who presents with chest pain over the last few days, described as sharp, substernal, associated with some increased shortness of breath but no cough or wheezing.  The patient denies fever or chills.  He denies any leg swelling.  He has no nausea or vomiting.  His last chemo treatment was today and he was supposed to get radiation but was sent to the ED for evaluation of the symptoms.    Physical Exam   Triage Vital Signs: ED Triage Vitals  Enc Vitals Group     BP 07/09/22 0940 104/74     Pulse Rate 07/09/22 0940 (!) 101     Resp 07/09/22 0940 16     Temp 07/09/22 0940 98.5 F (36.9 C)     Temp Source 07/09/22 0940 Oral     SpO2 07/09/22 0940 97 %     Weight 07/09/22 0930 187 lb (84.8 kg)     Height 07/09/22 0930 '5\' 10"'$  (1.778 m)     Head Circumference --      Peak Flow --      Pain Score 07/09/22 0930 5     Pain Loc --      Pain Edu? --      Excl. in [place]? --     Most recent vital signs: Vitals:   07/09/22 1300 07/09/22 1330  BP: 119/69 136/85  Pulse: 77 84  Resp: 12 13  Temp: 98 F (36.7 C)   SpO2: 99% 97%     General: Awake, no distress.  CV:  Good peripheral perfusion.  Normal heart sounds. Resp:  Normal effort.  Somewhat decreased breath sounds bilaterally with scattered wheezes. Abd:  No distention.  Other:  No peripheral edema.   ED Results / Procedures / Treatments   Labs (all labs ordered are listed, but only abnormal results are displayed) Labs Reviewed  COMPREHENSIVE METABOLIC PANEL - Abnormal; Notable for the following components:      Result Value   Glucose, Bld 121 (*)    All other components  within normal limits  CBC WITH DIFFERENTIAL/PLATELET - Abnormal; Notable for the following components:   Lymphs Abs 0.4 (*)    All other components within normal limits  BRAIN NATRIURETIC PEPTIDE  TROPONIN I (HIGH SENSITIVITY)  TROPONIN I (HIGH SENSITIVITY)     EKG  ED ECG REPORT I, Arta Silence, the attending physician, personally viewed and interpreted this ECG.  Date: 07/09/2022 EKG Time: 0936 Rate: 95 Rhythm: normal sinus rhythm QRS Axis: normal Intervals: LAFB ST/T Wave abnormalities: normal Narrative Interpretation: no evidence of acute ischemia; no significant change when compared to EKG of 02/28/2022    RADIOLOGY  Chest x-ray: I independently viewed and interpreted the images; there is no focal consolidation or edema   CT angio chest: No acute PE.  Some fluid and air in the esophagus consistent with distal esophageal obstruction.  PROCEDURES:  Critical Care performed: No  Procedures   MEDICATIONS ORDERED IN ED: Medications  iohexol (OMNIPAQUE) 350 MG/ML injection 75 mL (75 mLs Intravenous Contrast Given 07/09/22 0959)  morphine (PF) 4 MG/ML injection 4 mg (  4 mg Intravenous Given 07/09/22 1157)  pantoprazole (PROTONIX) injection 40 mg (40 mg Intravenous Given 07/09/22 1224)  ipratropium-albuterol (DUONEB) 0.5-2.5 (3) MG/3ML nebulizer solution 3 mL (3 mLs Nebulization Given 07/09/22 1346)  predniSONE (DELTASONE) tablet 60 mg (60 mg Oral Given 07/09/22 1346)     IMPRESSION / MDM / ASSESSMENT AND PLAN / ED COURSE  I reviewed the triage vital signs and the nursing notes.  71 year old male with PMH as noted above presents with worsening chest pain and shortness of breath over the last 2 days.  I reviewed the past medical records.  The patient was seen at the cancer center yesterday and is actively receiving chemotherapy and radiation for stage II esophageal cancer.  He has no significant prior cardiac history although does have COPD.  Initial EKG was read by  the machine as STEMI, however the EKG had a poor quality baseline and was not interpretable.  Repeat EKG done a few minutes later shows no ST elevations or acute ischemia and is unchanged from prior.  There is no evidence of STEMI at this time.  Differential diagnosis includes, but is not limited to, pain related to the esophageal cancer, esophageal dysmotility, side effects of radiation and/or chemotherapy, pleurisy, COPD exacerbation, pneumonia or other acute infection, PE, less likely ACS or other cardiac cause.  Patient's presentation is most consistent with acute presentation with potential threat to life or bodily function.  We will obtain basic and cardiac labs, chest x-ray, CT angio chest to rule out PE, and reassess.  The patient is on the cardiac monitor to evaluate for evidence of arrhythmia and/or significant heart rate changes.  ----------------------------------------- 2:15 PM on 07/09/2022 -----------------------------------------  CT angios shows no evidence of PE.  Troponins are negative.  Overall I suspect that the likely etiology of the patient's chest pain is his esophageal cancer.  The CT did show some fluid in the esophagus consistent with distal obstruction although the patient states he has mild dysphagia at this time that has not worsened.  The patient's pain improved with some analgesia but has returned.  Repeat EKG is unchanged.  After getting up and try to walk around, the patient became acutely shortness of breath and started wheezing.  It appears that he is having some measure of a COPD flare at the same time.  Given these multiple concurrent issues I consulted Dr. [place] Crane from the hospitalist service; based on her discussion she agrees to admit the patient.  FINAL CLINICAL IMPRESSION(S) / ED DIAGNOSES   Final diagnoses:  COPD exacerbation ([place])  Chest pain, unspecified type     Rx / DC Orders   ED Discharge Orders     None        Note:  This document  was prepared using Dragon voice recognition software and may include unintentional dictation errors.    Arta Silence, MD 07/09/22 [phone]

## 2022-07-10 DIAGNOSIS — F341 Dysthymic disorder: Secondary | ICD-10-CM

## 2022-07-10 DIAGNOSIS — E7800 Pure hypercholesterolemia, unspecified: Secondary | ICD-10-CM

## 2022-07-10 DIAGNOSIS — K219 Gastro-esophageal reflux disease without esophagitis: Secondary | ICD-10-CM | POA: Diagnosis not present

## 2022-07-10 DIAGNOSIS — C159 Malignant neoplasm of esophagus, unspecified: Secondary | ICD-10-CM

## 2022-07-10 DIAGNOSIS — I10 Essential (primary) hypertension: Secondary | ICD-10-CM

## 2022-07-10 DIAGNOSIS — J441 Chronic obstructive pulmonary disease with (acute) exacerbation: Secondary | ICD-10-CM | POA: Diagnosis not present

## 2022-07-10 LAB — COMPREHENSIVE METABOLIC PANEL
ALT: 23 U/L (ref 0–44)
AST: 19 U/L (ref 15–41)
Albumin: 3.7 g/dL (ref 3.5–5.0)
Alkaline Phosphatase: 66 U/L (ref 38–126)
Anion gap: 7 (ref 5–15)
BUN: 15 mg/dL (ref 8–23)
CO2: 32 mmol/L (ref 22–32)
Calcium: 9 mg/dL (ref 8.9–10.3)
Chloride: 101 mmol/L (ref 98–111)
Creatinine, Ser: 0.84 mg/dL (ref 0.61–1.24)
GFR, Estimated: >60 mL/min (ref >60)
Glucose, Bld: 98 mg/dL (ref 70–99)
Potassium: 3.9 mmol/L (ref 3.5–5.1)
Sodium: 140 mmol/L (ref 135–145)
Total Bilirubin: 0.7 mg/dL (ref 0.3–1.2)
Total Protein: 6.6 g/dL (ref 6.5–8.1)

## 2022-07-10 LAB — CBC WITH DIFFERENTIAL/PLATELET
Abs Immature Granulocytes: 0.02 10*3/uL (ref 0.00–0.07)
Basophils Absolute: 0 10*3/uL (ref 0.0–0.1)
Basophils Relative: 0 %
Eosinophils Absolute: 0 10*3/uL (ref 0.0–0.5)
Eosinophils Relative: 0 %
HCT: 38.9 % — ABNORMAL LOW (ref 39.0–52.0)
Hemoglobin: 12.4 g/dL — ABNORMAL LOW (ref 13.0–17.0)
Immature Granulocytes: 0 %
Lymphocytes Relative: 11 %
Lymphs Abs: 0.5 10*3/uL — ABNORMAL LOW (ref 0.7–4.0)
MCH: 28.2 pg (ref 26.0–34.0)
MCHC: 31.9 g/dL (ref 30.0–36.0)
MCV: 88.6 fL (ref 80.0–100.0)
Monocytes Absolute: 0.7 10*3/uL (ref 0.1–1.0)
Monocytes Relative: 16 %
Neutro Abs: 3.2 10*3/uL (ref 1.7–7.7)
Neutrophils Relative %: 73 %
Platelets: 160 10*3/uL (ref 150–400)
RBC: 4.39 MIL/uL (ref 4.22–5.81)
RDW: 14.6 % (ref 11.5–15.5)
WBC: 4.5 10*3/uL (ref 4.0–10.5)
nRBC: 0 % (ref 0.0–0.2)

## 2022-07-10 LAB — MAGNESIUM: Magnesium: 2.1 mg/dL (ref 1.7–2.4)

## 2022-07-10 LAB — PHOSPHORUS: Phosphorus: 4.1 mg/dL (ref 2.5–4.6)

## 2022-07-10 MED ORDER — METHYLPREDNISOLONE SODIUM SUCC 125 MG IJ SOLR
80.0000 mg | INTRAMUSCULAR | Status: DC
  Administered 2022-07-10 – 2022-07-11 (×2): 80 mg via INTRAVENOUS
  Filled 2022-07-10 (×2): qty 2

## 2022-07-10 MED ORDER — LORAZEPAM 1 MG PO TABS
1.0000 mg | ORAL_TABLET | Freq: Three times a day (TID) | ORAL | Status: DC | PRN
  Administered 2022-07-10 – 2022-07-12 (×4): 1 mg via ORAL
  Filled 2022-07-10 (×4): qty 1

## 2022-07-10 MED ORDER — IPRATROPIUM-ALBUTEROL 0.5-2.5 (3) MG/3ML IN SOLN
3.0000 mL | Freq: Three times a day (TID) | RESPIRATORY_TRACT | Status: DC
Start: 2022-07-10 — End: 2022-07-12
  Administered 2022-07-10 – 2022-07-12 (×6): 3 mL via RESPIRATORY_TRACT
  Filled 2022-07-10 (×6): qty 3

## 2022-07-10 NOTE — Evaluation (Addendum)
Physical Therapy Evaluation Patient Details Name: Leslie Sims MRN: [id number] DOB: [birth year] Today's Date: 07/10/2022  History of Present Illness  Leslie Sims is a 71 y.o. 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  Associated with audible wheezing.  Also endorses generalized weakness, progressive for about a week.    States depending on what he eats he may have spontaneous emesis without nausea.  Currently ongoing chemotherapy Monday through Friday and radiation on Fridays. Pt had SOB and Chest pain during radfiation therfore,  He was advised to go to the ED for further evaluation.       In the ED, he was noted to have audible wheezing improved with DuoNebs breathing treatment but still dyspneic, worse with ambulation.  Chest x-ray nonacute.  TRH, hospitalist service, was asked to admit for acute COPD exacerbation.  Clinical Impression  Pt received in bed and agreed to participate in PT evaluation. Pt motivated and pleasant. Pt participated in bed mobility with independence. Pt performed T/F including STS and Bed->chair with Min assist. Ambulated  in room with HHA of CGA and IV pole. Pt remained Dizzy without LOB noted. Pt is anxious and is awaiting antianxiety meds. Pt SPO2 85% in bed which improved with ambulation and PLB  to 94%. Pt educated regarding PLB reinforcement will benefit the pt. Pt will benefit from [facility] PT to improve strength, balance and aerobic capacity after Acute care  in order to return to PLOF and safe return home.      Recommendations for follow up therapy are one component of a multi-disciplinary discharge planning process, led by the attending physician.  Recommendations may be updated based on patient status,  additional functional criteria and insurance authorization.  Follow Up Recommendations Home health PT      Assistance Recommended at Discharge Intermittent Supervision/Assistance  Patient can return home with the following  A little help with walking and/or transfers;A little help with bathing/dressing/bathroom;Assistance with cooking/housework;Assist for transportation;Help with stairs or ramp for entrance    Equipment Recommendations Cane  Recommendations for Other Services       Functional Status Assessment Patient has had a recent decline in their functional status and demonstrates the ability to make significant improvements in function in a reasonable and predictable amount of time.     Precautions / Restrictions Precautions Precautions: Fall Restrictions Weight Bearing Restrictions: No      Mobility  Bed Mobility Overal bed mobility: Independent                  Transfers Overall transfer level: Needs assistance Equipment used: 1 person hand held assist Transfers: Sit to/from Stand, Bed to chair/wheelchair/BSC Sit to Stand: Min guard   Step pivot transfers: Min guard            Ambulation/Gait Ambulation/Gait assistance: Min Web designer (Feet): 12 Feet Assistive device: 1 person hand held assist, IV Pole Gait Pattern/deviations:  (unsteady) Gait velocity: decreased        Stairs: not appropriate at this point.             Wheelchair Mobility    Modified Rankin (Stroke Patients Only)       Balance Overall balance assessment: Needs assistance Sitting-balance support: Feet supported Sitting balance-Leahy Scale: Normal     Standing balance support: Single  extremity supported Standing balance-Leahy Scale: Fair                               Pertinent Vitals/Pain Pain Assessment Pain Assessment: No/denies pain    Home Living Family/patient expects to be discharged to:: Private residence Living  Arrangements: Alone Available Help at Discharge: Family (calls and checks up on pt.) Type of Home: Mobile home Home Access: Stairs to enter Entrance Stairs-Rails: Chemical engineer of Steps: 3   Home Layout: One level Home Equipment: None      Prior Function Prior Level of Function : Independent/Modified Independent;Driving (limited driving. Son i n law drives pt to the hosptial for cancer treatment.)               ADLs Comments: Independent     Hand Dominance        Extremity/Trunk Assessment   Upper Extremity Assessment Upper Extremity Assessment: Overall WFL for tasks assessed    Lower Extremity Assessment Lower Extremity Assessment: Generalized weakness       Communication   Communication: No difficulties  Cognition Arousal/Alertness: Awake/alert Behavior During Therapy: WFL for tasks assessed/performed, Anxious Overall Cognitive Status: Within Functional Limits for tasks assessed                                          General Comments      Exercises     Assessment/Plan    PT Assessment Patient needs continued PT services  PT Problem List Decreased strength;Decreased activity tolerance;Decreased balance;Decreased mobility       PT Treatment Interventions Gait training;Stair training;Functional mobility training;Therapeutic activities;Therapeutic exercise;Balance training;Neuromuscular re-education;Patient/family education    PT Goals (Current goals can be found in the Care Plan section)  Acute Rehab PT Goals Patient Stated Goal: " I want to get stronger to return home." PT Goal Formulation: With patient Time For Goal Achievement: 07/24/22 Potential to Achieve Goals: Good    Frequency Min 2X/week     Co-evaluation               AM-PAC PT "6 Clicks" Mobility  Outcome Measure Help needed turning from your back to your side while in a flat bed without using bedrails?: None Help needed moving from  lying on your back to sitting on the side of a flat bed without using bedrails?: None Help needed moving to and from a bed to a chair (including a wheelchair)?: A Little Help needed standing up from a chair using your arms (e.g., wheelchair or bedside chair)?: A Little Help needed to walk in hospital room?: A Little Help needed climbing 3-5 steps with a railing? : A Lot 6 Click Score: 19    End of Session Equipment Utilized During Treatment: Gait belt Activity Tolerance: Patient tolerated treatment well;Patient limited by fatigue Patient left: in chair;with call bell/phone within reach Nurse Communication: Mobility status PT Visit Diagnosis: Unsteadiness on feet (R26.81);Muscle weakness (generalized) (M62.81);Difficulty in walking, not elsewhere classified (R26.2);Dizziness and giddiness (R42)    Time: 5852-7782 PT Time Calculation (min) (ACUTE ONLY): 23 min   Charges:   PT Evaluation $PT Eval Low Complexity: 1 Low PT Treatments $Gait Training: 8-22 mins $Therapeutic Activity: 8-22 mins       Karys Meckley PT DPT 12:06 PM,07/10/22  Joaquin Music PT DPT 1:05 PM,07/10/22

## 2022-07-10 NOTE — Progress Notes (Signed)
PROGRESS NOTE    Leslie Sims [facility]  STM:[id number] DOB: [birth year] DOA: 07/09/2022 PCP: [name], MD    Brief Narrative:  Leslie Sims is a 71 y.o. 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  Associated with audible wheezing.  Also endorses generalized weakness, progressive for about a week.  Denies subjective fevers.  Denies a productive cough.  States depending on what he eats he may have spontaneous emesis without nausea.  Denies aspiration.  Currently ongoing chemotherapy Monday through Friday and radiation on Fridays.  He had chemotherapy today but because of his symptomatology his radiation was canceled today.  He was advised to go to the ED for further evaluation.     In the ED, he was noted to have audible wheezing improved with DuoNebs breathing treatment but still dyspneic, worse with ambulation.  Chest x-ray nonacute.  TRH, hospitalist service, was asked to admit for acute COPD exacerbation.  7/22.  Little anxious once his antianxiety medication to be restarted.  Still feels short of breath  Consultants:    Procedures:   Antimicrobials:  Azithromycin   Subjective: No chest pain  Objective: Vitals:   07/10/22 0725 07/10/22 0726 07/10/22 1328 07/10/22 1533  BP: 128/85   107/73  Pulse: 93   90  Resp: 18   18  Temp: 97.8 F (36.6 C)   98.4 F (36.9 C)  TempSrc: Oral   Oral  SpO2: 99% 94% 98% 98%  Weight:      Height:        Intake/Output Summary (Last 24 hours) at 07/10/2022 1634 Last data filed at 07/10/2022 1400 Gross per 24 hour  Intake --  Output 1500 ml  Net -1500 ml   Filed Weights   07/09/22 0930  Weight: 84.8 kg    Examination: Calm, NAD Mildly forced end expiratory wheezing, no  rhonchi Reg s1/s2 no gallop Soft benign +bs No edema Aaoxox3  Mood and affect appropriate in current setting     Data Reviewed: I have personally reviewed following labs and imaging studies  CBC: Recent Labs  Lab 07/08/22 1326 07/09/22 0942 07/10/22 0630  WBC 3.6* 5.2 4.5  NEUTROABS 2.7 4.0 3.2  HGB 12.9* 13.3 12.4*  HCT 41.0 42.6 38.9*  MCV 91.5 90.6 88.6  PLT 169 179 892   Basic Metabolic Panel: Recent Labs  Lab 07/08/22 1326 07/09/22 0942 07/10/22 0630  NA 137 138 140  K 4.5 3.8 3.9  CL 97* 98 101  CO2 33* 29 32  GLUCOSE 145* 121* 98  BUN '17 17 15  '$ CREATININE 0.85 0.83 0.84  CALCIUM 9.5 9.5 9.0  MG  --   --  2.1  PHOS  --   --  4.1   GFR: Estimated Creatinine Clearance: 86.9 mL/min (by C-G formula based on SCr of 0.84 mg/dL). Liver Function Tests: Recent Labs  Lab 07/08/22 1326 07/09/22 0942 07/10/22 0630  AST '21 30 19  '$ ALT '21 23 23  '$ ALKPHOS 69 76 66  BILITOT 0.3 0.7 0.7  PROT 6.9 7.5 6.6  ALBUMIN 3.9 4.1 3.7   No results for input(s): "LIPASE", "AMYLASE" in the last 168 hours. No results for input(s): "AMMONIA" in the last 168 hours. Coagulation Profile: No results for input(s): "INR", "PROTIME" in the last 168 hours. Cardiac Enzymes: No  results for input(s): "CKTOTAL", "CKMB", "CKMBINDEX", "TROPONINI" in the last 168 hours. BNP (last 3 results) No results for input(s): "PROBNP" in the last 8760 hours. HbA1C: No results for input(s): "HGBA1C" in the last 72 hours. CBG: No results for input(s): "GLUCAP" in the last 168 hours. Lipid Profile: No results for input(s): "CHOL", "HDL", "LDLCALC", "TRIG", "CHOLHDL", "LDLDIRECT" in the last 72 hours. Thyroid Function Tests: No results for input(s): "TSH", "T4TOTAL", "FREET4", "T3FREE", "THYROIDAB" in the last 72 hours. Anemia Panel: No results for input(s): "VITAMINB12", "FOLATE", "FERRITIN", "TIBC", "IRON", "RETICCTPCT" in the last 72 hours. Sepsis Labs: No results for input(s): "PROCALCITON",  "LATICACIDVEN" in the last 168 hours.  No results found for this or any previous visit (from the past 240 hour(s)).       Radiology Studies: DG Chest Port 1 View  Result Date: 07/09/2022 CLINICAL DATA:  Chest pain, shortness of breath over the past week EXAM: PORTABLE CHEST 1 VIEW COMPARISON:  03/15/2022, 07/09/2022 FINDINGS: 7 mm right upper lobe pulmonary nodule. Faintly visualized 9 mm left upper lobe pulmonary nodule. Bilateral upper lobe pulmonary nodules were better characterized on the CT of the chest dated 07/09/2022 performed earlier same day. Bilateral mild interstitial thickening. Bilateral emphysematous changes. No focal consolidation. No pleural effusion or pneumothorax. Heart and mediastinal contours are unremarkable. No acute osseous abnormality. IMPRESSION: 1. No acute cardiopulmonary disease. Electronically Signed   By: Kathreen Devoid M.D.   On: 07/09/2022 10:40   CT Angio Chest PE W and/or Wo Contrast  Result Date: 07/09/2022 CLINICAL DATA:  Pulmonary embolism high probability, shortness of breath and chest pain. History of esophageal neoplasm. * Tracking Code: BO * EXAM: CT ANGIOGRAPHY CHEST WITH CONTRAST TECHNIQUE: Multidetector CT imaging of the chest was performed using the standard protocol during bolus administration of intravenous contrast. Multiplanar CT image reconstructions and MIPs were obtained to evaluate the vascular anatomy. RADIATION DOSE REDUCTION: This exam was performed according to the departmental dose-optimization program which includes automated exposure control, adjustment of the mA and/or kV according to patient size and/or use of iterative reconstruction technique. CONTRAST:  64m OMNIPAQUE IOHEXOL 350 MG/ML SOLN COMPARISON:  PET-CT May 07, 2022. FINDINGS: Cardiovascular: Satisfactory opacification of the pulmonary arteries to the segmental level. No evidence of pulmonary embolism. Aortic atherosclerosis. Normal heart size. No pericardial effusion.  Mediastinum/Nodes: No suspicious thyroid nodule. No pathologically enlarged mediastinal, hilar or axillary lymph nodes. Stable calcified 15 mm nodule in the right axilla. Marked distal esophageal wall thickening with patulous gas and fluid filled proximal esophagus similar prior PET-CT. Lungs/Pleura: Stable 9 mm left upper lobe pulmonary nodule which was not hypermetabolic on prior PET-CT. No new suspicious pulmonary nodules or masses. Bilateral calcified granulomata. Stable upper lobe predominant paraseptal and centrilobular emphysematous change with multifocal scarring including a bandlike area in the right upper lobe. Upper Abdomen: No acute abnormality.  Calcified splenic granulomata. Musculoskeletal: No aggressive lytic or blastic lesion of bone. Multilevel degenerative changes spine. Review of the MIP images confirms the above findings. IMPRESSION: 1. No evidence of pulmonary embolus. 2. Similar appearance of the masslike distal esophageal wall thickening consistent with patient's known esophageal carcinoma. 3. Similar patulous appearance of the proximal esophagus with fluid and gas filling the esophagus, almost certainly reflecting a measure of distal esophageal obstruction, quantification of which can not be determined on this examination. 4. No evidence of metastatic disease in the chest. 5. Stable emphysematous change and multifocal pulmonary scarring. 6. Aortic Atherosclerosis (ICD10-I70.0) and Emphysema (ICD10-J43.9). Electronically Signed   By: JDellis Filbert  Nance Pew M.D.   On: 07/09/2022 10:22        Scheduled Meds:  aspirin EC  81 mg Oral Daily   atorvastatin  80 mg Oral QPM   azithromycin  250 mg Oral Q1200   budesonide  0.5 mg Nebulization BID   enoxaparin (LOVENOX) injection  40 mg Subcutaneous Q24H   furosemide  40 mg Oral Daily   ipratropium-albuterol  3 mL Nebulization TID   losartan  25 mg Oral Daily   methylPREDNISolone (SOLU-MEDROL) injection  40 mg Intravenous Q12H   mirtazapine  45  mg Oral QHS   multivitamin with minerals  1 tablet Oral Daily   pantoprazole  40 mg Oral Daily   sucralfate  1 g Oral BID WC   Continuous Infusions:  Assessment & Plan:   Active Problems:   Hyperlipidemia   DEPRESSION/ANXIETY   Essential hypertension   GERD   COPD exacerbation (HCC)   Primary esophageal squamous cell carcinoma (HCC)   Acute COPD exacerbation, unclear trigger Chest x-ray non acute Ongoing chemotherapy and radiation for esophageal cancer Radiation session canceled today 07/09/22 due to dyspnea Maintain O2 sat with greater than 90% 7/22 wheezing on exam Change prednisone p.o. to IV Solu-Medrol DuoNebs Pulmicort  Essential hypertension On losartan   Esophageal cancer with ongoing chemotherapy and radiation Management per medical/radiation oncology   Chronic anxiety/depression Resume lorazepam   GERD Continue PPI   Hyperlipidemia Continue statins   Generalized weakness in the setting of ongoing chemotherapy and radiation PT assessment-recommends home health Encourage protein calorie intake as tolerated Currently on soft diet, can advance as tolerated with aspiration precautions.     DVT prophylaxis: Lovenox Code Status: Full Family Communication: None at bedside Disposition Plan: Home with home health Status is: Observation The patient remains OBS appropriate and will d/c before 2 midnights.        LOS: 0 days   Time spent: 35 min    Nolberto Hanlon, MD [facility] Pager [phone]  If 7PM-7AM, please contact night-coverage 07/10/2022, 4:34 PM

## 2022-07-11 DIAGNOSIS — I252 Old myocardial infarction: Secondary | ICD-10-CM | POA: Diagnosis not present

## 2022-07-11 DIAGNOSIS — Z825 Family history of asthma and other chronic lower respiratory diseases: Secondary | ICD-10-CM | POA: Diagnosis not present

## 2022-07-11 DIAGNOSIS — Z9221 Personal history of antineoplastic chemotherapy: Secondary | ICD-10-CM | POA: Diagnosis not present

## 2022-07-11 DIAGNOSIS — R531 Weakness: Secondary | ICD-10-CM | POA: Diagnosis present

## 2022-07-11 DIAGNOSIS — I5022 Chronic systolic (congestive) heart failure: Secondary | ICD-10-CM | POA: Diagnosis present

## 2022-07-11 DIAGNOSIS — I10 Essential (primary) hypertension: Secondary | ICD-10-CM | POA: Diagnosis not present

## 2022-07-11 DIAGNOSIS — E7800 Pure hypercholesterolemia, unspecified: Secondary | ICD-10-CM | POA: Diagnosis not present

## 2022-07-11 DIAGNOSIS — Z923 Personal history of irradiation: Secondary | ICD-10-CM | POA: Diagnosis not present

## 2022-07-11 DIAGNOSIS — Z9981 Dependence on supplemental oxygen: Secondary | ICD-10-CM | POA: Diagnosis not present

## 2022-07-11 DIAGNOSIS — Z7982 Long term (current) use of aspirin: Secondary | ICD-10-CM | POA: Diagnosis not present

## 2022-07-11 DIAGNOSIS — I255 Ischemic cardiomyopathy: Secondary | ICD-10-CM | POA: Diagnosis present

## 2022-07-11 DIAGNOSIS — Z79899 Other long term (current) drug therapy: Secondary | ICD-10-CM | POA: Diagnosis not present

## 2022-07-11 DIAGNOSIS — C159 Malignant neoplasm of esophagus, unspecified: Secondary | ICD-10-CM | POA: Diagnosis present

## 2022-07-11 DIAGNOSIS — Z8249 Family history of ischemic heart disease and other diseases of the circulatory system: Secondary | ICD-10-CM | POA: Diagnosis not present

## 2022-07-11 DIAGNOSIS — F32A Depression, unspecified: Secondary | ICD-10-CM | POA: Diagnosis present

## 2022-07-11 DIAGNOSIS — I110 Hypertensive heart disease with heart failure: Secondary | ICD-10-CM | POA: Diagnosis present

## 2022-07-11 DIAGNOSIS — J441 Chronic obstructive pulmonary disease with (acute) exacerbation: Secondary | ICD-10-CM | POA: Diagnosis present

## 2022-07-11 DIAGNOSIS — K219 Gastro-esophageal reflux disease without esophagitis: Secondary | ICD-10-CM | POA: Diagnosis present

## 2022-07-11 DIAGNOSIS — F419 Anxiety disorder, unspecified: Secondary | ICD-10-CM | POA: Diagnosis present

## 2022-07-11 DIAGNOSIS — Z955 Presence of coronary angioplasty implant and graft: Secondary | ICD-10-CM | POA: Diagnosis not present

## 2022-07-11 DIAGNOSIS — Z888 Allergy status to other drugs, medicaments and biological substances status: Secondary | ICD-10-CM | POA: Diagnosis not present

## 2022-07-11 DIAGNOSIS — I2510 Atherosclerotic heart disease of native coronary artery without angina pectoris: Secondary | ICD-10-CM | POA: Diagnosis present

## 2022-07-11 DIAGNOSIS — F341 Dysthymic disorder: Secondary | ICD-10-CM | POA: Diagnosis not present

## 2022-07-11 DIAGNOSIS — E785 Hyperlipidemia, unspecified: Secondary | ICD-10-CM | POA: Diagnosis present

## 2022-07-11 DIAGNOSIS — Z7951 Long term (current) use of inhaled steroids: Secondary | ICD-10-CM | POA: Diagnosis not present

## 2022-07-11 DIAGNOSIS — Z801 Family history of malignant neoplasm of trachea, bronchus and lung: Secondary | ICD-10-CM | POA: Diagnosis not present

## 2022-07-11 DIAGNOSIS — Z87891 Personal history of nicotine dependence: Secondary | ICD-10-CM | POA: Diagnosis not present

## 2022-07-11 NOTE — TOC Initial Note (Signed)
Transition of Care [facility]) - Initial/Assessment Note    Patient Details  Name: Leslie Sims MRN: [id number] Date of Birth: 01-02-1951  Transition of Care [facility]) CM/SW Contact:    Beverly Sessions, RN Phone Number: 07/11/2022, 11:18 AM  Clinical Narrative:                  Admitted for: COPD Admitted from: Home alone ONG:EXBMW  Current home health/prior home health/DME: Patient said he has several canes at home  Patient states that he has home O2 through [place]. Patient states he has his portable concentrator, but he will need to be wheeled down on O2 so he can plug his concentrator in in care  States that his brother in law or daughter will transport at discharge  Therapy recommending home health. Patient in agreement states he does not have a preference of agency.  Referral made and accepted by Corene Cornea with [facility]  Patient declines a cane, says he already has several at home     Expected Discharge Plan: [place] Barriers to Discharge: Continued Medical Work up   Patient Goals and CMS Choice        Expected Discharge Plan and Services Expected Discharge Plan: [place]       Living arrangements for the past 2 months: Single Family Home                           HH Arranged: PT, OT, RN [facility] Agency: [place] ([place]) Date HH Agency Contacted: 07/11/22   Representative spoke with at [place]: Corene Cornea  Prior Living Arrangements/Services Living arrangements for the past 2 months: [place] Lives with:: Self              Current home services: DME    Activities of Daily Living Home Assistive Devices/Equipment: Oxygen, Cane (specify quad or straight) (straight) ADL Screening (condition at time of admission) Patient's cognitive ability adequate to safely complete daily activities?: Yes Is the patient deaf or have difficulty hearing?: No Does the patient have difficulty seeing, even  when wearing glasses/contacts?: No Does the patient have difficulty concentrating, remembering, or making decisions?: Yes Patient able to express need for assistance with ADLs?: Yes Does the patient have difficulty dressing or bathing?: No Independently performs ADLs?: Yes (appropriate for developmental age) Does the patient have difficulty walking or climbing stairs?: No Weakness of Legs: None Weakness of Arms/Hands: None  Permission Sought/Granted                  Emotional Assessment              Admission diagnosis:  COPD exacerbation ([place]) [J44.1] Chest pain, unspecified type [R07.9] 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   Anemia due to antineoplastic chemotherapy 06/26/2022   Constipation 06/19/2022   Encounter for antineoplastic chemotherapy 05/31/2022   Weight loss 05/31/2022   Dysphagia 05/31/2022   Primary esophageal squamous cell carcinoma ([place]) 05/04/2022   COPD exacerbation ([place]) 07/31/2021   Prostate cancer ([place]) 10/11/2020   Elevated PSA 07/28/2020   Iron deficiency anemia 05/20/2020   Goals of care, counseling/discussion    Palliative care by specialist    Healthcare-associated pneumonia 01/28/2019   COPD with acute exacerbation ([place]) 12/31/2018   Generalized anxiety disorder 11/12/2018   Moderate episode of recurrent major depressive disorder ([place]) 11/12/2018   Acute respiratory  failure with hypoxia ([place]) 09/29/2018   Acute respiratory failure with hypoxia and hypercapnia ([place]) 09/13/2018   Acute on chronic respiratory failure with hypoxia and hypercapnia ([place]) 08/24/2018   Chest pain 07/02/2018   Grief reaction 01/13/2018   COPD with hypoxia ([place]) 12/25/2017   Unstable angina (HCC)    Benign neoplasm of ascending colon    Benign neoplasm of descending colon    Benign neoplasm of sigmoid colon    History of colonic polyps    Pulmonary nodules/lesions, multiple 01/05/2015   Bruit of left carotid  artery 11/04/2014   Sleep disorder 07/18/2013   Seasonal and perennial allergic rhinitis 05/29/2013   Bradycardia 04/20/2011   SMOKER 07/30/2010   CAD, NATIVE VESSEL 04/23/2010   Hyperlipidemia 03/24/2010   DEPRESSION/ANXIETY 45/62/5638   Chronic systolic heart failure ([place]) 03/24/2010   COPD, very severe ([place]) 03/24/2010   GERD 03/24/2010   Essential hypertension 11/21/2009   PCP:  [name], MD Pharmacy:   [place], [place] - [address] [place] [address] Phone: [phone] Fax: [phone]  [id number] [id number]: [phone]: [phone]    Social Determinants of Health (SDOH) Interventions    Readmission Risk Interventions    07/11/2022   11:17 AM  Readmission Risk Prevention Plan  Transportation Screening Complete  HRI or Home Care Consult Complete  Palliative Care Screening Not Applicable  Medication Review (RN Care Manager) Complete

## 2022-07-11 NOTE — Progress Notes (Signed)
PROGRESS NOTE    Bliss Tsang [facility]  LNL:[id number] DOB: [birth year] DOA: 07/09/2022 PCP: [name], MD    Brief Narrative:  Leslie Sims is a 71 y.o. 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  Associated with audible wheezing.  Also endorses generalized weakness, progressive for about a week.  Denies subjective fevers.  Denies a productive cough.  States depending on what he eats he may have spontaneous emesis without nausea.  Denies aspiration.  Currently ongoing chemotherapy Monday through Friday and radiation on Fridays.  He had chemotherapy today but because of his symptomatology his radiation was canceled today.  He was advised to go to the ED for further evaluation.     In the ED, he was noted to have audible wheezing improved with DuoNebs breathing treatment but still dyspneic, worse with ambulation.  Chest x-ray nonacute.  TRH, hospitalist service, was asked to admit for acute COPD exacerbation.  7/22.  Little anxious once his antianxiety medication to be restarted.  Still feels short of breath 7/23 feels breathing is improving today.  No chest pain.  Consultants:    Procedures:   Antimicrobials:  Azithromycin   Subjective: No GI symptoms  Objective: Vitals:   07/10/22 2035 07/11/22 0352 07/11/22 0713 07/11/22 0801  BP: 134/77 135/78  140/80  Pulse: 97 80  82  Resp: '20 16  18  '$ Temp: 98.1 F (36.7 C) 97.6 F (36.4 C)  97.9 F (36.6 C)  TempSrc: Oral     SpO2: 92% 99% 96% 100%  Weight:      Height:        Intake/Output Summary (Last 24 hours) at 07/11/2022 1503 Last data filed at 07/11/2022 1300 Gross per 24 hour  Intake 90 ml  Output 900 ml  Net -810 ml   Filed Weights   07/09/22 0930  Weight:  84.8 kg    Examination: Calm, NAD No exp. Wheezing. Increase exp. time Reg s1/s2 no gallop Soft benign +bs No edema Aaoxox3  Mood and affect appropriate in current setting     Data Reviewed: I have personally reviewed following labs and imaging studies  CBC: Recent Labs  Lab 07/08/22 1326 07/09/22 0942 07/10/22 0630  WBC 3.6* 5.2 4.5  NEUTROABS 2.7 4.0 3.2  HGB 12.9* 13.3 12.4*  HCT 41.0 42.6 38.9*  MCV 91.5 90.6 88.6  PLT 169 179 408   Basic Metabolic Panel: Recent Labs  Lab 07/08/22 1326 07/09/22 0942 07/10/22 0630  NA 137 138 140  K 4.5 3.8 3.9  CL 97* 98 101  CO2 33* 29 32  GLUCOSE 145* 121* 98  BUN '17 17 15  '$ CREATININE 0.85 0.83 0.84  CALCIUM 9.5 9.5 9.0  MG  --   --  2.1  PHOS  --   --  4.1   GFR: Estimated Creatinine Clearance: 86.9 mL/min (by C-G formula based on SCr of 0.84 mg/dL). Liver Function Tests: Recent Labs  Lab 07/08/22 1326 07/09/22 0942 07/10/22 0630  AST '21 30 19  '$ ALT '21 23 23  '$ ALKPHOS 69 76 66  BILITOT 0.3 0.7 0.7  PROT 6.9 7.5 6.6  ALBUMIN 3.9 4.1 3.7   No results for input(s): "LIPASE", "AMYLASE" in the last 168 hours. No results for input(s): "AMMONIA" in the last 168 hours. Coagulation Profile: No  results for input(s): "INR", "PROTIME" in the last 168 hours. Cardiac Enzymes: No results for input(s): "CKTOTAL", "CKMB", "CKMBINDEX", "TROPONINI" in the last 168 hours. BNP (last 3 results) No results for input(s): "PROBNP" in the last 8760 hours. HbA1C: No results for input(s): "HGBA1C" in the last 72 hours. CBG: No results for input(s): "GLUCAP" in the last 168 hours. Lipid Profile: No results for input(s): "CHOL", "HDL", "LDLCALC", "TRIG", "CHOLHDL", "LDLDIRECT" in the last 72 hours. Thyroid Function Tests: No results for input(s): "TSH", "T4TOTAL", "FREET4", "T3FREE", "THYROIDAB" in the last 72 hours. Anemia Panel: No results for input(s): "VITAMINB12", "FOLATE", "FERRITIN", "TIBC", "IRON", "RETICCTPCT" in the last  72 hours. Sepsis Labs: No results for input(s): "PROCALCITON", "LATICACIDVEN" in the last 168 hours.  No results found for this or any previous visit (from the past 240 hour(s)).       Radiology Studies: No results found.      Scheduled Meds:  aspirin EC  81 mg Oral Daily   atorvastatin  80 mg Oral QPM   azithromycin  250 mg Oral Q1200   budesonide  0.5 mg Nebulization BID   enoxaparin (LOVENOX) injection  40 mg Subcutaneous Q24H   furosemide  40 mg Oral Daily   ipratropium-albuterol  3 mL Nebulization TID   losartan  25 mg Oral Daily   methylPREDNISolone (SOLU-MEDROL) injection  80 mg Intravenous Q24H   mirtazapine  45 mg Oral QHS   multivitamin with minerals  1 tablet Oral Daily   pantoprazole  40 mg Oral Daily   sucralfate  1 g Oral BID WC   Continuous Infusions:  Assessment & Plan:   Active Problems:   Hyperlipidemia   DEPRESSION/ANXIETY   Essential hypertension   GERD   COPD exacerbation (HCC)   Primary esophageal squamous cell carcinoma (HCC)   Acute COPD exacerbation, unclear trigger Chest x-ray non acute Ongoing chemotherapy and radiation for esophageal cancer Radiation session canceled today 07/09/22 due to dyspnea Maintain O2 sat with greater than 90% 7/22 wheezing on exam 7/23 improving slowly  Continue IV steroids  Pulmicort  Antibiotic  DuoNebs     Essential hypertension On losartan   Esophageal cancer with ongoing chemotherapy and radiation Management per medical/radiation oncology      Chronic anxiety/depression Continue lorazepam   GERD Continue PPI   Hyperlipidemia Continue statins   Generalized weakness in the setting of ongoing chemotherapy and radiation PT assessment-recommends home health Encourage protein calorie intake as tolerated Currently on soft diet, can advance as tolerated with aspiration precautions.     DVT prophylaxis: Lovenox Code Status: Full Family Communication: None at bedside Disposition Plan:  Home with home health Status is: Inpatient Patient remains inpatient as he requires IV treatment        LOS: 0 days   Time spent: 74 min    Nolberto Hanlon, MD [facility] Pager [phone]  If 7PM-7AM, please contact night-coverage 07/11/2022, 3:03 PM

## 2022-07-11 NOTE — Care Management Obs Status (Signed)
[place] NOTIFICATION   Patient Details  Name: Leslie Sims MRN: [id number] Date of Birth: 01-15-1951   Medicare Observation Status Notification Given:  Yes    Beverly Sessions, RN 07/11/2022, 11:17 AM

## 2022-07-11 NOTE — Plan of Care (Signed)
Patient remains on 3 LPM O2 via [place], without respiratory distress noted. Audible expiratory wheezing noted on assessment. Patient had no complaints of pain, continues to ambulate independently in room and taking meds whole.  Problem: Education: Goal: Knowledge of General Education information will improve Description: Including pain rating scale, medication(s)/side effects and non-pharmacologic comfort measures Outcome: Progressing   Problem: Health Behavior/Discharge Planning: Goal: Ability to manage health-related needs will improve Outcome: Progressing   Problem: Clinical Measurements: Goal: Ability to maintain clinical measurements within normal limits will improve Outcome: Progressing Goal: Will remain free from infection Outcome: Progressing Goal: Diagnostic test results will improve Outcome: Progressing Goal: Respiratory complications will improve Outcome: Progressing Goal: Cardiovascular complication will be avoided Outcome: Progressing   Problem: Activity: Goal: Risk for activity intolerance will decrease Outcome: Progressing   Problem: Nutrition: Goal: Adequate nutrition will be maintained Outcome: Progressing   Problem: Coping: Goal: Level of anxiety will decrease Outcome: Progressing   Problem: Elimination: Goal: Will not experience complications related to bowel motility Outcome: Progressing Goal: Will not experience complications related to urinary retention Outcome: Progressing   Problem: Pain Managment: Goal: General experience of comfort will improve Outcome: Progressing   Problem: Safety: Goal: Ability to remain free from injury will improve Outcome: Progressing   Problem: Skin Integrity: Goal: Risk for impaired skin integrity will decrease Outcome: Progressing

## 2022-07-12 ENCOUNTER — Other Ambulatory Visit: Payer: Self-pay

## 2022-07-12 ENCOUNTER — Ambulatory Visit: Payer: Medicare Other

## 2022-07-12 DIAGNOSIS — J441 Chronic obstructive pulmonary disease with (acute) exacerbation: Secondary | ICD-10-CM | POA: Diagnosis not present

## 2022-07-12 DIAGNOSIS — I10 Essential (primary) hypertension: Secondary | ICD-10-CM | POA: Diagnosis not present

## 2022-07-12 DIAGNOSIS — K219 Gastro-esophageal reflux disease without esophagitis: Secondary | ICD-10-CM | POA: Diagnosis not present

## 2022-07-12 DIAGNOSIS — F341 Dysthymic disorder: Secondary | ICD-10-CM | POA: Diagnosis not present

## 2022-07-12 LAB — POTASSIUM: Potassium: 4.3 mmol/L (ref 3.5–5.1)

## 2022-07-12 MED ORDER — PREDNISONE 20 MG PO TABS: 20.0000 mg | ORAL_TABLET | Freq: Every day | ORAL | 0 refills | Status: DC

## 2022-07-12 MED ORDER — AZITHROMYCIN 250 MG PO TABS: 250.0000 mg | ORAL_TABLET | Freq: Every day | ORAL | 0 refills | Status: AC

## 2022-07-12 MED ORDER — AZITHROMYCIN 250 MG PO TABS: 250.0000 mg | ORAL_TABLET | ORAL | 0 refills | Status: DC

## 2022-07-12 NOTE — TOC Transition Note (Signed)
Transition of Care [facility]) - CM/SW Discharge Note   Patient Details  Name: Leslie Sims MRN: [id number] Date of Birth: 1951/07/12  Transition of Care [facility]) CM/SW Contact:  Beverly Sessions, RN Phone Number: 07/12/2022, 11:25 AM   Clinical Narrative:    Patient to DC today Corene Cornea with [place] notified      Barriers to Discharge: Continued Medical Work up   Patient Goals and CMS Choice        Discharge Placement                       Discharge Plan and Services                          HH Arranged: PT, OT, RN [facility] Agency: [place] ([place]) Date [place]: 07/11/22   Representative spoke with at [place]: [place] Interventions     Readmission Risk Interventions    07/12/2022   11:24 AM 07/11/2022   11:17 AM  Readmission Risk Prevention Plan  Transportation Screening  Complete  PCP or Specialist Appt within 3-5 Days Complete   HRI or Home Care Consult  Complete  Palliative Care Screening  Not Applicable  Medication Review (RN Care Manager)  Complete

## 2022-07-12 NOTE — Discharge Summary (Signed)
Kahleb Mcclane [facility] ZOX:[id number] DOB: 08/23/1951 DOA: 07/09/2022  PCP: [name], MD  Admit date: 07/09/2022 Discharge date: 07/12/2022  Admitted From: Home Disposition: Home  Recommendations for Outpatient Follow-up:  Follow up with PCP in 1 week Please obtain BMP/CBC in one week Please follow up pulmonology Dr. Patsey Berthold as previously scheduled     Discharge Condition:Stable CODE STATUS: Full Diet recommendation: Dysphagia 3 Brief/Interim Summary: Per WJX:BJYNWGN Leslie Sims is a 71 y.o. 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  Associated with audible wheezing.  Also endorses generalized weakness, progressive for about a week.  Denies subjective fevers.  Denies a productive cough.  States depending on what he eats he may have spontaneous emesis without nausea.  Denies aspiration.  Currently ongoing chemotherapy Monday through Friday and radiation on Fridays.  He had chemotherapy today but because of his symptomatology his radiation was canceled today.  He was advised to go to the ED for further evaluation.     In the ED, he was noted to have audible wheezing improved with DuoNebs breathing treatment but still dyspneic, worse with ambulation.  Chest x-ray nonacute. CT chest see results below, no pulmonary embolus. ED Course: Tmax 98.5.  BP 120/76, pulse 80, respiratory 12, with saturation 97% on 3 L.  CBC and CMP essentially unremarkable.  High-sensitivity troponin 8.  BNP 22. Patient was admitted to the hospital started on IV steroids and antibiotics with azithromycin.  Was started on DuoNebs.  He is doing better and close to baseline of his oxygenation and feels that he is ready to go home.   Acute COPD exacerbation, unclear  trigger Chest x-ray non acute Ongoing chemotherapy and radiation for esophageal cancer Radiation session canceled today 07/09/22 due to dyspnea Maintain O2 sat with greater than 90% Treated with IV steroids, MDI, antibiotics and DuoNebs Doing better clinically improved will discharge on steroid taper Has a follow-up with his pulmonologist already       Essential hypertension On losartan   Esophageal cancer with ongoing chemotherapy and radiation Management per medical/radiation oncology        Chronic anxiety/depression Continue lorazepam   GERD Continue PPI and sucralfate   Hyperlipidemia Continue statins   Generalized weakness in the setting of ongoing chemotherapy and radiation PT assessment-recommends home health Encourage protein calorie intake as tolerated            Discharge Diagnoses:  Active Problems:   Hyperlipidemia   DEPRESSION/ANXIETY   Essential hypertension   GERD   COPD exacerbation ([place])   Primary esophageal squamous cell carcinoma [facility])    Discharge Instructions  Discharge Instructions     Diet - low sodium heart healthy   Complete by: As directed    Diet - low sodium heart healthy   Complete by: As directed    Discharge instructions   Complete by: As directed    Start steroid po  tomorrow   Increase activity slowly   Complete by: As directed    Increase activity slowly   Complete by: As directed       Allergies as of 07/12/2022       Reactions   Prozac [fluoxetine Hcl] Shortness Of Breath   Hydroxyzine    Bupropion Other (See Comments)   "Makes me feel funny" Other reaction(s): Other (See Comments)  Unknown/"made me feel real funny" "Makes me feel funny"   Effexor Xr [venlafaxine Hcl Er] Other (See Comments)   "Makes me feel funny"        Medication List     STOP taking these medications    amitriptyline 25 MG tablet Commonly known as: ELAVIL   busPIRone 7.5 MG tablet Commonly known as: BUSPAR    ondansetron 8 MG tablet Commonly known as: Zofran       TAKE these medications    AeroChamber MV inhaler Use as instructed   albuterol 108 (90 Base) MCG/ACT inhaler Commonly known as: VENTOLIN HFA Inhale 2 puffs into the lungs every 6 (six) hours as needed for wheezing or shortness of breath.   aspirin 81 MG tablet Take 81 mg by mouth daily.   atorvastatin 80 MG tablet Commonly known as: LIPITOR Take 1 tablet (80 mg total) by mouth daily.   azithromycin 250 MG tablet Commonly known as: ZITHROMAX Take 1 tablet (250 mg total) by mouth daily for 2 days. What changed: You were already taking a medication with the same name, and this prescription was added. Make sure you understand how and when to take each.   azithromycin 250 MG tablet Commonly known as: ZITHROMAX Take 1 tablet (250 mg total) by mouth 3 (three) times a week. Pt takes it on Mon,Wed,Fridays.  Resume once you finish your hospital dose What changed: additional instructions   budesonide 0.5 MG/2ML nebulizer solution Commonly known as: PULMICORT Take 2 mLs (0.5 mg total) by nebulization in the morning and at bedtime.   docusate sodium 100 MG capsule Commonly known as: Colace Take 1 capsule (100 mg total) by mouth 2 (two) times daily.   esomeprazole 40 MG capsule Commonly known as: NEXIUM TAKE ONE CAPSULE BY MOUTH ONCE DAILY   fluticasone 50 MCG/ACT nasal spray Commonly known as: FLONASE Place 2 sprays into both nostrils daily.   furosemide 40 MG tablet Commonly known as: LASIX TAKE ONE TABLET BY MOUTH ONCE DAILY   ipratropium-albuterol 0.5-2.5 (3) MG/3ML Soln Commonly known as: DUONEB Take 3 mLs by nebulization 2 (two) times daily.   lidocaine-prilocaine cream Commonly known as: EMLA Apply to affected area once   loratadine 10 MG tablet Commonly known as: CLARITIN Take 10 mg by mouth daily.   LORazepam 1 MG tablet Commonly known as: ATIVAN Take 1 tablet (1 mg total) by mouth every 4 (four)  hours while awake. What changed: when to take this   losartan 25 MG tablet Commonly known as: COZAAR Take 25 mg by mouth daily.   MiraLax 17 g packet Generic drug: polyethylene glycol Take 17 g by mouth daily as needed.   mirtazapine 45 MG tablet Commonly known as: REMERON Take 45 mg by mouth at bedtime.   multivitamin capsule Take 1 capsule by mouth daily.   nitroGLYCERIN 0.4 MG SL tablet Commonly known as: NITROSTAT Place 1 tablet (0.4 mg total) under the tongue every 5 (five) minutes as needed.   OXYGEN Inhale 2.5 L into the lungs daily.   predniSONE 20 MG tablet Commonly known as: DELTASONE Take 1 tablet (20 mg total) by mouth daily for 8 doses.   prochlorperazine 10 MG tablet Commonly known as: COMPAZINE Take 1 tablet (10 mg total) by mouth every 6 (six) hours as needed (Nausea or vomiting).   sucralfate 1 g tablet Commonly known as: CARAFATE Take 1 g by mouth 2 (two) times daily with a meal.        Follow-up Information  [name], MD Follow up in 1 week(s).   Specialty: Family Medicine Contact information: [place] [address] [phone]                Allergies  Allergen Reactions   Prozac [Fluoxetine Hcl] Shortness Of Breath   Hydroxyzine    Bupropion Other (See Comments)    "Makes me feel funny" Other reaction(s): Other (See Comments) Unknown/"made me feel real funny" "Makes me feel funny"   Effexor Xr [Venlafaxine Hcl Er] Other (See Comments)    "Makes me feel funny"    Consultations:    Procedures/Studies: DG Chest Port 1 View  Result Date: 07/09/2022 CLINICAL DATA:  Chest pain, shortness of breath over the past week EXAM: PORTABLE CHEST 1 VIEW COMPARISON:  03/15/2022, 07/09/2022 FINDINGS: 7 mm right upper lobe pulmonary nodule. Faintly visualized 9 mm left upper lobe pulmonary nodule. Bilateral upper lobe pulmonary nodules were better characterized on the CT of the chest dated 07/09/2022  performed earlier same day. Bilateral mild interstitial thickening. Bilateral emphysematous changes. No focal consolidation. No pleural effusion or pneumothorax. Heart and mediastinal contours are unremarkable. No acute osseous abnormality. IMPRESSION: 1. No acute cardiopulmonary disease. Electronically Signed   By: Kathreen Devoid M.D.   On: 07/09/2022 10:40   CT Angio Chest PE W and/or Wo Contrast  Result Date: 07/09/2022 CLINICAL DATA:  Pulmonary embolism high probability, shortness of breath and chest pain. History of esophageal neoplasm. * Tracking Code: BO * EXAM: CT ANGIOGRAPHY CHEST WITH CONTRAST TECHNIQUE: Multidetector CT imaging of the chest was performed using the standard protocol during bolus administration of intravenous contrast. Multiplanar CT image reconstructions and MIPs were obtained to evaluate the vascular anatomy. RADIATION DOSE REDUCTION: This exam was performed according to the departmental dose-optimization program which includes automated exposure control, adjustment of the mA and/or kV according to patient size and/or use of iterative reconstruction technique. CONTRAST:  49m OMNIPAQUE IOHEXOL 350 MG/ML SOLN COMPARISON:  PET-CT May 07, 2022. FINDINGS: Cardiovascular: Satisfactory opacification of the pulmonary arteries to the segmental level. No evidence of pulmonary embolism. Aortic atherosclerosis. Normal heart size. No pericardial effusion. Mediastinum/Nodes: No suspicious thyroid nodule. No pathologically enlarged mediastinal, hilar or axillary lymph nodes. Stable calcified 15 mm nodule in the right axilla. Marked distal esophageal wall thickening with patulous gas and fluid filled proximal esophagus similar prior PET-CT. Lungs/Pleura: Stable 9 mm left upper lobe pulmonary nodule which was not hypermetabolic on prior PET-CT. No new suspicious pulmonary nodules or masses. Bilateral calcified granulomata. Stable upper lobe predominant paraseptal and centrilobular emphysematous change  with multifocal scarring including a bandlike area in the right upper lobe. Upper Abdomen: No acute abnormality.  Calcified splenic granulomata. Musculoskeletal: No aggressive lytic or blastic lesion of bone. Multilevel degenerative changes spine. Review of the MIP images confirms the above findings. IMPRESSION: 1. No evidence of pulmonary embolus. 2. Similar appearance of the masslike distal esophageal wall thickening consistent with patient's known esophageal carcinoma. 3. Similar patulous appearance of the proximal esophagus with fluid and gas filling the esophagus, almost certainly reflecting a measure of distal esophageal obstruction, quantification of which can not be determined on this examination. 4. No evidence of metastatic disease in the chest. 5. Stable emphysematous change and multifocal pulmonary scarring. 6. Aortic Atherosclerosis (ICD10-I70.0) and Emphysema (ICD10-J43.9). Electronically Signed   By: JDahlia BailiffM.D.   On: 07/09/2022 10:22      Subjective: Feels much better.  Shortness of breath at baseline.  No chest pain  Discharge Exam: Vitals:   07/12/22 0527 07/12/22 0808  BP: (!) 144/99 (!) 143/83  Pulse: 87 82  Resp: 16 16  Temp: 98 F (36.7 C) 98.2 F (36.8 C)  SpO2: 98% 100%   Vitals:   07/11/22 2125 07/12/22 0500 07/12/22 0527 07/12/22 0808  BP: 133/70  (!) 144/99 (!) 143/83  Pulse: 80  87 82  Resp: '16  16 16  '$ Temp: 98.1 F (36.7 C)  98 F (36.7 C) 98.2 F (36.8 C)  TempSrc: Oral  Oral   SpO2: 100%  98% 100%  Weight:  78.1 kg    Height:        General: Pt is alert, awake, not in acute distress Cardiovascular: RRR, S1/S2 +, no rubs, no gallops Respiratory: CTA bilaterally, no wheezing, no rhonchi Abdominal: Soft, NT, ND, bowel sounds + Extremities: no edema, no cyanosis    The results of significant diagnostics from this hospitalization (including imaging, microbiology, ancillary and laboratory) are listed below for reference.      Microbiology: No results found for this or any previous visit (from the past 240 hour(s)).   Labs: BNP (last 3 results) Recent Labs    10/19/21 1526 07/09/22 0942  BNP 30.1 72.6   Basic Metabolic Panel: Recent Labs  Lab 07/08/22 1326 07/09/22 0942 07/10/22 0630 07/12/22 0552  NA 137 138 140  --   K 4.5 3.8 3.9 4.3  CL 97* 98 101  --   CO2 33* 29 32  --   GLUCOSE 145* 121* 98  --   BUN '17 17 15  '$ --   CREATININE 0.85 0.83 0.84  --   CALCIUM 9.5 9.5 9.0  --   MG  --   --  2.1  --   PHOS  --   --  4.1  --    Liver Function Tests: Recent Labs  Lab 07/08/22 1326 07/09/22 0942 07/10/22 0630  AST '21 30 19  '$ ALT '21 23 23  '$ ALKPHOS 69 76 66  BILITOT 0.3 0.7 0.7  PROT 6.9 7.5 6.6  ALBUMIN 3.9 4.1 3.7   No results for input(s): "LIPASE", "AMYLASE" in the last 168 hours. No results for input(s): "AMMONIA" in the last 168 hours. CBC: Recent Labs  Lab 07/08/22 1326 07/09/22 0942 07/10/22 0630  WBC 3.6* 5.2 4.5  NEUTROABS 2.7 4.0 3.2  HGB 12.9* 13.3 12.4*  HCT 41.0 42.6 38.9*  MCV 91.5 90.6 88.6  PLT 169 179 160   Cardiac Enzymes: No results for input(s): "CKTOTAL", "CKMB", "CKMBINDEX", "TROPONINI" in the last 168 hours. BNP: Invalid input(s): "POCBNP" CBG: No results for input(s): "GLUCAP" in the last 168 hours. D-Dimer No results for input(s): "DDIMER" in the last 72 hours. Hgb A1c No results for input(s): "HGBA1C" in the last 72 hours. Lipid Profile No results for input(s): "CHOL", "HDL", "LDLCALC", "TRIG", "CHOLHDL", "LDLDIRECT" in the last 72 hours. Thyroid function studies No results for input(s): "TSH", "T4TOTAL", "T3FREE", "THYROIDAB" in the last 72 hours.  Invalid input(s): "FREET3" Anemia work up No results for input(s): "VITAMINB12", "FOLATE", "FERRITIN", "TIBC", "IRON", "RETICCTPCT" in the last 72 hours. Urinalysis    Component Value Date/Time   COLORURINE YELLOW (A) 12/22/2021 0238   APPEARANCEUR CLEAR (A) 12/22/2021 0238   APPEARANCEUR  Clear 01/10/2013 1205   LABSPEC 1.021 12/22/2021 0238   LABSPEC 1.004 01/10/2013 1205   PHURINE 7.0 12/22/2021 0238   GLUCOSEU NEGATIVE 12/22/2021 0238   GLUCOSEU 50 mg/dL 01/10/2013 1205   [place] 12/22/2021 0238  BILIRUBINUR NEGATIVE 12/22/2021 0238   BILIRUBINUR negative 07/27/2017 1259   BILIRUBINUR neg 05/10/2014 1208   BILIRUBINUR Negative 01/10/2013 1205   KETONESUR NEGATIVE 12/22/2021 0238   PROTEINUR 30 (A) 12/22/2021 0238   UROBILINOGEN 0.2 07/27/2017 1259   NITRITE NEGATIVE 12/22/2021 0238   LEUKOCYTESUR NEGATIVE 12/22/2021 0238   LEUKOCYTESUR Negative 01/10/2013 1205   Sepsis Labs Recent Labs  Lab 07/08/22 1326 07/09/22 0942 07/10/22 0630  WBC 3.6* 5.2 4.5   Microbiology No results found for this or any previous visit (from the past 240 hour(s)).   Time coordinating discharge: Over 30 minutes  SIGNED:   Nolberto Hanlon, MD  [facility] 07/12/2022, 10:56 AM Pager   If 7PM-7AM, please contact night-coverage [email or web address] Password [email or web address]

## 2022-07-13 ENCOUNTER — Ambulatory Visit: Payer: Medicare Other

## 2022-07-14 ENCOUNTER — Ambulatory Visit
Admission: RE | Admit: 2022-07-14 | Discharge: 2022-07-14 | Disposition: A | Discharge status: Home / Self Care | Payer: Medicare Other | Source: Ambulatory Visit | Attending: Radiation Oncology | Admitting: Radiation Oncology

## 2022-07-14 ENCOUNTER — Other Ambulatory Visit: Payer: Self-pay

## 2022-07-14 DIAGNOSIS — Z5111 Encounter for antineoplastic chemotherapy: Secondary | ICD-10-CM | POA: Diagnosis not present

## 2022-07-14 LAB — RAD ONC ARIA SESSION SUMMARY
Course Elapsed Days: 30
Plan Fractions Treated to Date: 20
Plan Prescribed Dose Per Fraction: 1.8 Gy
Plan Total Fractions Prescribed: 30
Plan Total Prescribed Dose: 54 Gy
Reference Point Dosage Given to Date: 36 Gy
Reference Point Session Dosage Given: 1.8 Gy
Session Number: 20

## 2022-07-15 ENCOUNTER — Other Ambulatory Visit: Payer: Self-pay

## 2022-07-15 ENCOUNTER — Ambulatory Visit
Admission: RE | Admit: 2022-07-15 | Discharge: 2022-07-15 | Disposition: A | Discharge status: Home / Self Care | Payer: Medicare Other | Source: Ambulatory Visit | Attending: Radiation Oncology | Admitting: Radiation Oncology

## 2022-07-15 DIAGNOSIS — Z5111 Encounter for antineoplastic chemotherapy: Secondary | ICD-10-CM | POA: Diagnosis not present

## 2022-07-15 LAB — RAD ONC ARIA SESSION SUMMARY
Course Elapsed Days: 31
Plan Fractions Treated to Date: 21
Plan Prescribed Dose Per Fraction: 1.8 Gy
Plan Total Fractions Prescribed: 30
Plan Total Prescribed Dose: 54 Gy
Reference Point Dosage Given to Date: 37.8 Gy
Reference Point Session Dosage Given: 1.8 Gy
Session Number: 21

## 2022-07-16 ENCOUNTER — Other Ambulatory Visit: Payer: Self-pay

## 2022-07-16 ENCOUNTER — Inpatient Hospital Stay: Payer: Medicare Other

## 2022-07-16 ENCOUNTER — Encounter: Payer: Self-pay | Admitting: Oncology

## 2022-07-16 ENCOUNTER — Ambulatory Visit
Admission: RE | Admit: 2022-07-16 | Discharge: 2022-07-16 | Disposition: A | Discharge status: Home / Self Care | Payer: Medicare Other | Source: Ambulatory Visit | Attending: Radiation Oncology | Admitting: Radiation Oncology

## 2022-07-16 ENCOUNTER — Inpatient Hospital Stay (HOSPITAL_BASED_OUTPATIENT_CLINIC_OR_DEPARTMENT_OTHER): Payer: Medicare Other | Admitting: Oncology

## 2022-07-16 VITALS — HR 98

## 2022-07-16 DIAGNOSIS — C159 Malignant neoplasm of esophagus, unspecified: Secondary | ICD-10-CM

## 2022-07-16 DIAGNOSIS — Z5111 Encounter for antineoplastic chemotherapy: Secondary | ICD-10-CM | POA: Diagnosis not present

## 2022-07-16 DIAGNOSIS — R0902 Hypoxemia: Secondary | ICD-10-CM

## 2022-07-16 DIAGNOSIS — C61 Malignant neoplasm of prostate: Secondary | ICD-10-CM | POA: Diagnosis not present

## 2022-07-16 DIAGNOSIS — J449 Chronic obstructive pulmonary disease, unspecified: Secondary | ICD-10-CM | POA: Diagnosis not present

## 2022-07-16 LAB — COMPREHENSIVE METABOLIC PANEL
ALT: 22 U/L (ref 0–44)
AST: 17 U/L (ref 15–41)
Albumin: 4.1 g/dL (ref 3.5–5.0)
Alkaline Phosphatase: 69 U/L (ref 38–126)
Anion gap: 8 (ref 5–15)
BUN: 17 mg/dL (ref 8–23)
CO2: 34 mmol/L — ABNORMAL HIGH (ref 22–32)
Calcium: 9.6 mg/dL (ref 8.9–10.3)
Chloride: 99 mmol/L (ref 98–111)
Creatinine, Ser: 0.93 mg/dL (ref 0.61–1.24)
GFR, Estimated: >60 mL/min (ref >60)
Glucose, Bld: 128 mg/dL — ABNORMAL HIGH (ref 70–99)
Potassium: 3.8 mmol/L (ref 3.5–5.1)
Sodium: 141 mmol/L (ref 135–145)
Total Bilirubin: 0.7 mg/dL (ref 0.3–1.2)
Total Protein: 7 g/dL (ref 6.5–8.1)

## 2022-07-16 LAB — CBC WITH DIFFERENTIAL/PLATELET
Abs Immature Granulocytes: 0.1 10*3/uL — ABNORMAL HIGH (ref 0.00–0.07)
Basophils Absolute: 0 10*3/uL (ref 0.0–0.1)
Basophils Relative: 0 %
Eosinophils Absolute: 0 10*3/uL (ref 0.0–0.5)
Eosinophils Relative: 1 %
HCT: 41.6 % (ref 39.0–52.0)
Hemoglobin: 13.3 g/dL (ref 13.0–17.0)
Immature Granulocytes: 1 %
Lymphocytes Relative: 6 %
Lymphs Abs: 0.4 10*3/uL — ABNORMAL LOW (ref 0.7–4.0)
MCH: 29.3 pg (ref 26.0–34.0)
MCHC: 32 g/dL (ref 30.0–36.0)
MCV: 91.6 fL (ref 80.0–100.0)
Monocytes Absolute: 0.9 10*3/uL (ref 0.1–1.0)
Monocytes Relative: 12 %
Neutro Abs: 5.5 10*3/uL (ref 1.7–7.7)
Neutrophils Relative %: 80 %
Platelets: 144 10*3/uL — ABNORMAL LOW (ref 150–400)
RBC: 4.54 MIL/uL (ref 4.22–5.81)
RDW: 15.8 % — ABNORMAL HIGH (ref 11.5–15.5)
WBC: 7 10*3/uL (ref 4.0–10.5)
nRBC: 0 % (ref 0.0–0.2)

## 2022-07-16 LAB — RAD ONC ARIA SESSION SUMMARY
Course Elapsed Days: 32
Plan Fractions Treated to Date: 22
Plan Prescribed Dose Per Fraction: 1.8 Gy
Plan Total Fractions Prescribed: 30
Plan Total Prescribed Dose: 54 Gy
Reference Point Dosage Given to Date: 39.6 Gy
Reference Point Session Dosage Given: 1.8 Gy
Session Number: 22

## 2022-07-16 MED ORDER — SODIUM CHLORIDE 0.9 % IV SOLN
210.0000 mg | Freq: Once | INTRAVENOUS | Status: AC
  Administered 2022-07-16: 210 mg via INTRAVENOUS
  Filled 2022-07-16: qty 21

## 2022-07-16 MED ORDER — SODIUM CHLORIDE 0.9 % IV SOLN
102.0000 mg | Freq: Once | INTRAVENOUS | Status: AC
  Administered 2022-07-16: 102 mg via INTRAVENOUS
  Filled 2022-07-16: qty 17

## 2022-07-16 MED ORDER — MONTELUKAST SODIUM 10 MG PO TABS: 10.0000 mg | ORAL_TABLET | Freq: Every day | ORAL | Status: DC

## 2022-07-16 MED ORDER — PALONOSETRON HCL INJECTION 0.25 MG/5ML
0.2500 mg | Freq: Once | INTRAVENOUS | Status: AC
  Administered 2022-07-16: 0.25 mg via INTRAVENOUS
  Filled 2022-07-16: qty 5

## 2022-07-16 MED ORDER — SODIUM CHLORIDE 0.9 % IV SOLN
Freq: Once | INTRAVENOUS | Status: AC
  Filled 2022-07-16: qty 250

## 2022-07-16 MED ORDER — DIPHENHYDRAMINE HCL 50 MG/ML IJ SOLN
50.0000 mg | Freq: Once | INTRAMUSCULAR | Status: AC
  Administered 2022-07-16: 50 mg via INTRAVENOUS
  Filled 2022-07-16: qty 1

## 2022-07-16 MED ORDER — FAMOTIDINE IN NACL 20-0.9 MG/50ML-% IV SOLN
20.0000 mg | Freq: Once | INTRAVENOUS | Status: AC
  Administered 2022-07-16: 20 mg via INTRAVENOUS
  Filled 2022-07-16: qty 50

## 2022-07-16 MED ORDER — MONTELUKAST SODIUM 10 MG PO TABS
10.0000 mg | ORAL_TABLET | Freq: Once | ORAL | Status: AC
  Administered 2022-07-16: 10 mg via ORAL
  Filled 2022-07-16: qty 1

## 2022-07-16 MED ORDER — SODIUM CHLORIDE 0.9 % IV SOLN
10.0000 mg | Freq: Once | INTRAVENOUS | Status: AC
  Administered 2022-07-16: 10 mg via INTRAVENOUS
  Filled 2022-07-16: qty 10

## 2022-07-16 NOTE — Assessment & Plan Note (Signed)
Chemotherapy plan as listed above 

## 2022-07-16 NOTE — Patient Instructions (Signed)
[facility] AT [place]  Discharge Instructions: Thank you for choosing [facility] to provide your oncology and hematology care.  If you have a lab appointment with the [facility], please go directly to the [facility] and check in at the registration area.  Wear comfortable clothing and clothing appropriate for easy access to any Portacath or PICC line.   We strive to give you quality time with your provider. You may need to reschedule your appointment if you arrive late (15 or more minutes).  Arriving late affects you and other patients whose appointments are after yours.  Also, if you miss three or more appointments without notifying the office, you may be dismissed from the clinic at the provider's discretion.      For prescription refill requests, have your pharmacy contact our office and allow 72 hours for refills to be completed.    Today you received the following chemotherapy and/or immunotherapy agents taxol & carboplatin    To help prevent nausea and vomiting after your treatment, we encourage you to take your nausea medication as directed.  BELOW ARE SYMPTOMS THAT SHOULD BE REPORTED IMMEDIATELY: *FEVER GREATER THAN 100.4 F (38 C) OR HIGHER *CHILLS OR SWEATING *NAUSEA AND VOMITING THAT IS NOT CONTROLLED WITH YOUR NAUSEA MEDICATION *UNUSUAL SHORTNESS OF BREATH *UNUSUAL BRUISING OR BLEEDING *URINARY PROBLEMS (pain or burning when urinating, or frequent urination) *BOWEL PROBLEMS (unusual diarrhea, constipation, pain near the anus) TENDERNESS IN MOUTH AND THROAT WITH OR WITHOUT PRESENCE OF ULCERS (sore throat, sores in mouth, or a toothache) UNUSUAL RASH, SWELLING OR PAIN  UNUSUAL VAGINAL DISCHARGE OR ITCHING   Items with * indicate a potential emergency and should be followed up as soon as possible or go to the Emergency Department if any problems should occur.  Please show the CHEMOTHERAPY ALERT CARD or IMMUNOTHERAPY ALERT CARD at  check-in to the Emergency Department and triage nurse.  Should you have questions after your visit or need to cancel or reschedule your appointment, please contact [facility] [place]  [phone] and follow the prompts.  Office hours are 8:00 a.m. to 4:30 p.m. Monday - Friday. Please note that voicemails left after 4:00 p.m. may not be returned until the following business day.  We are closed weekends and major holidays. You have access to a nurse at all times for urgent questions. Please call the main number to the clinic [phone] and follow the prompts.  For any non-urgent questions, you may also contact your provider using MyChart. We now offer e-Visits for anyone 30 and older to request care online for non-urgent symptoms. For details visit [email or web address].   Also download the MyChart app! Go to the app store, search "MyChart", open the app, select [facility], and log in with your MyChart username and password.  Masks are optional in the cancer centers. If you would like for your care team to wear a mask while they are taking care of you, please let them know. For doctor visits, patients may have with them one support person who is at least 69 years old. At this time, visitors are not allowed in the infusion area.

## 2022-07-16 NOTE — Progress Notes (Signed)
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 Labs reviewed and discussed with patient. Proceed with carboplatin/Taxol  Encounter for antineoplastic chemotherapy Chemotherapy plan as listed above.  COPD with hypoxia ([place]) Recent COPD exacerbation. Finish course of steroid.  He is on nasal cannula oxygen.   Prostate cancer ([place]) Gleason score 3+4, status post definitive radiation. Continue follow-up with urology Dr. Diamantina Providence.      No orders of the defined types were placed in this encounter.  Follow up  1 week  lab md [place] taxol  The patient understands the plans discussed today and is in agreement with them.  He knows to contact our office if he develops concerns prior to his next appointment.   [name], MD      CHIEF COMPLAINT:  Chief complaints: follow up for esophageal carcinoma treatments.  History of prostate cancer.  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 showed partially obstructive likely malignant esophageal tumor was found at [place] junction.  Biopsied.  Normal stomach and normal examined duodenum. Biopsy pathology showed fragments of the squamous mucosa with high-grade dysplastic.  Scant gastric mucosa present with reactive changes and no dysplastic or significant intestinal metaplastic.  Fragments of fibrillar purulent ulcer diabetes also present.  04/27/2022, repeat EGD and a biopsy of GE junction partially obstructed ulcerating mass. Repeat biopsy pathology showed superficial and disaggregated fragments of squamous mucosa with at least severe dysplastic/squamous cell carcinoma in situ.  Definite invasion cannot be identified.  In part secondary to specimen fragmentation and tangential sectioning.    04/27/2022 Imaging   MRI abdomen MRCP with and without contrast showed bilateral benign adrenal adenoma.  Small hepatic hemangioma and small right hepatic parenchymal vascular anomalies. Small hiatal hernia.  Colonic diverticulosis.   05/04/2022 Initial Diagnosis   Primary esophageal squamous cell carcinoma  + Stomach discomfort for 4 months, dysphagia for both solids and liquids at times. + Weight loss   05/04/2022 Cancer Staging   Staging form: Esophagus - Squamous Cell Carcinoma, AJCC 8th Edition - Clinical: Stage II (cT3, cN0, cM0, L: Lower) - Signed by [name], MD on 05/31/2022 Stage prefix: Initial diagnosis Total positive nodes: 0   05/09/2022 Imaging   PET scan showed intense tracer uptake associated with the distal esophageal mass just above the level of the GE junction compatible with primary esophageal neoplasm. No signs of tracer avid nodal metastasis or solid organ metastasis.   05/20/2022 Procedure   EUS by Dr.Spaete.  T3,Nx 05/20/2022, esophagus mass  biopsy showed invasive moderately differentiated squamous cell carcinoma, with basaloid features and minimal keratinization.  Background squamous cell carcinoma  in situ.   06/03/2022 -  Chemotherapy   Patient is on Treatment Plan :  ESOPHAGUS Carboplatin/PACLitaxel weekly x 6 weeks with XRT       07/09/2022 - 07/12/2022 Hospital Admission   Hospitalization due to COPD exacerbation.       Patient has multiple comorbidities including COPD/chronic respiratory failure, history of STEMI/combined systolic and diastolic CHF [last LVEF 40 to 45%], lung nodules previously avid on PET scan, biopsy negative and was felt to be calcified granulomas.-Pulmonology Dr. Patsey Berthold Patient lives by himself.  Independent with ADL and some of the iADL  INTERVAL HISTORY:  Leslie Sims is here today for repeat clinical assessment. He denies fevers or chills. He denies pain. His appetite is fair His weight is stable. + Esophageal dysphagia, with solids.  He has no difficulty with pured food and liquids. Some pain with swallowing  + Fatigue and tiredness  Recent hospitalization due to COPD exacerbation.  SOB is better since discharge.  no nausea vomiting diarrhea.   REVIEW OF SYSTEMS:  Review of Systems  Constitutional:  Positive for appetite change, fatigue and unexpected weight change. Negative for chills and fever.  HENT:   Negative for hearing loss and voice change.   Eyes:  Negative for eye problems and icterus.  Respiratory:  Positive for shortness of breath. Negative for chest tightness and cough.        Chronic respiratory failure on oxygen  Cardiovascular:  Negative for chest pain and leg swelling.  Gastrointestinal:  Positive for constipation. Negative for abdominal distention and abdominal pain.       Dysphagia  Endocrine: Negative for hot flashes.  Genitourinary:  Negative for difficulty urinating, dysuria and frequency.   Musculoskeletal:  Negative for arthralgias.  Skin:  Negative for itching and rash.  Neurological:  Negative for light-headedness and numbness.  Hematological:  Negative for adenopathy. Does not bruise/bleed easily.   Psychiatric/Behavioral:  Negative for confusion.      VITALS:  Blood pressure 121/82, pulse (!) 103, temperature 98.2 F (36.8 C), temperature source Tympanic, weight 172 lb (78 kg).  Wt Readings from Last 3 Encounters:  07/16/22 172 lb (78 kg)  07/12/22 172 lb 2.9 oz (78.1 kg)  07/08/22 176 lb 1.6 oz (79.9 kg)    Body mass index is 24.68 kg/m.  Performance status (ECOG): 2 - Symptomatic, <50% confined to bed  PHYSICAL EXAM:  Physical Exam Constitutional:      General: He is not in acute distress.    Appearance: He is obese. He is not diaphoretic.  HENT:     Head: Normocephalic and atraumatic.     Nose: Nose normal.     Mouth/Throat:     Pharynx: No oropharyngeal exudate.  Eyes:     General: No scleral icterus.    Pupils: Pupils are equal, round, and reactive to light.  Cardiovascular:     Rate and Rhythm: Normal rate.     Heart sounds: No murmur heard. Pulmonary:     Effort: Pulmonary effort is normal. No respiratory distress.     Comments: Patient is on nasal cannula oxygen Abdominal:     General: There is no distension.     Palpations: Abdomen is soft.     Tenderness: There is no abdominal tenderness.  Musculoskeletal:        General: Normal range of motion.     Cervical back: Normal range  of motion and neck supple.  Skin:    General: Skin is warm and dry.     Findings: No erythema.  Neurological:     Mental Status: He is alert and oriented to person, place, and time.     Cranial Nerves: No cranial nerve deficit.     Motor: No abnormal muscle tone.     Coordination: Coordination normal.  Psychiatric:        Mood and Affect: Mood and affect normal.      LABS:      Latest Ref Rng & Units 07/16/2022    9:07 AM 07/10/2022    6:30 AM 07/09/2022    9:42 AM  CBC  WBC 4.0 - 10.5 K/uL 7.0  4.5  5.2   Hemoglobin 13.0 - 17.0 g/dL 13.3  12.4  13.3   Hematocrit 39.0 - 52.0 % 41.6  38.9  42.6   Platelets 150 - 400 K/uL 144  160  179       Latest Ref Rng &  Units 07/16/2022    9:07 AM 07/12/2022    5:52 AM 07/10/2022    6:30 AM  CMP  Glucose 70 - 99 mg/dL 128   98   BUN 8 - 23 mg/dL 17   15   Creatinine 0.61 - 1.24 mg/dL 0.93   0.84   Sodium 135 - 145 mmol/L 141   140   Potassium 3.5 - 5.1 mmol/L 3.8  4.3  3.9   Chloride 98 - 111 mmol/L 99   101   CO2 22 - 32 mmol/L 34   32   Calcium 8.9 - 10.3 mg/dL 9.6   9.0   Total Protein 6.5 - 8.1 g/dL 7.0   6.6   Total Bilirubin 0.3 - 1.2 mg/dL 0.7   0.7   Alkaline Phos 38 - 126 U/L 69   66   AST 15 - 41 U/L 17   19   ALT 0 - 44 U/L 22   23      STUDIES:  DG Chest Port 1 View  Result Date: 07/09/2022 CLINICAL DATA:  Chest pain, shortness of breath over the past week EXAM: PORTABLE CHEST 1 VIEW COMPARISON:  03/15/2022, 07/09/2022 FINDINGS: 7 mm right upper lobe pulmonary nodule. Faintly visualized 9 mm left upper lobe pulmonary nodule. Bilateral upper lobe pulmonary nodules were better characterized on the CT of the chest dated 07/09/2022 performed earlier same day. Bilateral mild interstitial thickening. Bilateral emphysematous changes. No focal consolidation. No pleural effusion or pneumothorax. Heart and mediastinal contours are unremarkable. No acute osseous abnormality. IMPRESSION: 1. No acute cardiopulmonary disease. Electronically Signed   By: Kathreen Devoid M.D.   On: 07/09/2022 10:40   CT Angio Chest PE W and/or Wo Contrast  Result Date: 07/09/2022 CLINICAL DATA:  Pulmonary embolism high probability, shortness of breath and chest pain. History of esophageal neoplasm. * Tracking Code: BO * EXAM: CT ANGIOGRAPHY CHEST WITH CONTRAST TECHNIQUE: Multidetector CT imaging of the chest was performed using the standard protocol during bolus administration of intravenous contrast. Multiplanar CT image reconstructions and MIPs were obtained to evaluate the vascular anatomy. RADIATION DOSE REDUCTION: This exam was performed according to the departmental dose-optimization program which includes automated exposure  control, adjustment of the mA and/or kV according to patient size and/or use of iterative reconstruction technique. CONTRAST:  43m OMNIPAQUE IOHEXOL 350 MG/ML SOLN COMPARISON:  PET-CT May 07, 2022. FINDINGS: Cardiovascular: Satisfactory opacification of the pulmonary arteries to the segmental level. No evidence  of pulmonary embolism. Aortic atherosclerosis. Normal heart size. No pericardial effusion. Mediastinum/Nodes: No suspicious thyroid nodule. No pathologically enlarged mediastinal, hilar or axillary lymph nodes. Stable calcified 15 mm nodule in the right axilla. Marked distal esophageal wall thickening with patulous gas and fluid filled proximal esophagus similar prior PET-CT. Lungs/Pleura: Stable 9 mm left upper lobe pulmonary nodule which was not hypermetabolic on prior PET-CT. No new suspicious pulmonary nodules or masses. Bilateral calcified granulomata. Stable upper lobe predominant paraseptal and centrilobular emphysematous change with multifocal scarring including a bandlike area in the right upper lobe. Upper Abdomen: No acute abnormality.  Calcified splenic granulomata. Musculoskeletal: No aggressive lytic or blastic lesion of bone. Multilevel degenerative changes spine. Review of the MIP images confirms the above findings. IMPRESSION: 1. No evidence of pulmonary embolus. 2. Similar appearance of the masslike distal esophageal wall thickening consistent with patient's known esophageal carcinoma. 3. Similar patulous appearance of the proximal esophagus with fluid and gas filling the esophagus, almost certainly reflecting a measure of distal esophageal obstruction, quantification of which can not be determined on this examination. 4. No evidence of metastatic disease in the chest. 5. Stable emphysematous change and multifocal pulmonary scarring. 6. Aortic Atherosclerosis (ICD10-I70.0) and Emphysema (ICD10-J43.9). Electronically Signed   By: Dahlia Bailiff M.D.   On: 07/09/2022 10:22   NM PET Image  Initial (PI) Skull Base To Thigh (F-18 FDG)  Result Date: 05/09/2022 CLINICAL DATA:  Subsequent treatment strategy for esophageal neoplasm. EXAM: NUCLEAR MEDICINE PET SKULL BASE TO THIGH TECHNIQUE: 10.24 mCi F-18 FDG was injected intravenously. Full-ring PET imaging was performed from the skull base to thigh after the radiotracer. CT data was obtained and used for attenuation correction and anatomic localization. Fasting blood glucose: 107 mg/dl COMPARISON:  PET-CT 10/13/2017. CT chest 01/21/2021. CT AP 12/21/2021 and MRI abdomen 04/26/2022 FINDINGS: Mediastinal blood pool activity: SUV max 2.24 Liver activity: SUV max NA NECK: No hypermetabolic lymph nodes in the neck. Incidental CT findings: None CHEST: No tracer avid axillary, supraclavicular, mediastinal, or hilar lymph nodes. Mass involving the distal esophagus measures 5.1 x 3.1 by 4.8 cm and has an SUV max of 12.82, image 133/2. The more proximal portions of the esophagus appear mildly dilated and patulous. Marked narrowing of the esophageal lumen is noted at the level of the tumor. Incidental CT findings: No tracer avid lung nodules identified at this time. Partially calcified nodule within the left upper lobe is again noted and is stable measuring 9 mm, image 84/2. Stable bandlike area of scarring within the anterior right upper lobe. Aortic atherosclerosis and coronary artery calcifications. Moderate emphysema. ABDOMEN/PELVIS: No abnormal tracer uptake identified within the liver, pancreas, spleen, or adrenal glands. No tracer avid abdominopelvic lymph nodes. Incidental CT findings: Bilateral adrenal nodules are again noted, previously characterized as benign adenomas. No follow-up recommended. Penile prosthesis is identified. Bilateral fat containing inguinal hernias, right greater than left. SKELETON: No focal hypermetabolic activity to suggest skeletal metastasis. Incidental CT findings: none IMPRESSION: 1. There is intense tracer uptake associated  with the distal esophageal mass just above the level of the GE junction compatible with primary esophageal neoplasm. 2. No signs of tracer avid nodal metastasis or solid organ metastasis. 3. Aortic Atherosclerosis (ICD10-I70.0) and Emphysema (ICD10-J43.9). 4. Coronary artery calcifications. Electronically Signed   By: Kerby Moors M.D.   On: 05/09/2022 16:41   MR ABDOMEN MRCP W WO CONTAST  Result Date: 04/27/2022 CLINICAL DATA:  Abnormal findings on CT, adrenal nodules EXAM: MRI ABDOMEN WITHOUT AND WITH CONTRAST (INCLUDING  MRCP) TECHNIQUE: Multiplanar multisequence MR imaging of the abdomen was performed both before and after the administration of intravenous contrast. Heavily T2-weighted images of the biliary and pancreatic ducts were obtained, and three-dimensional MRCP images were rendered by post processing. CONTRAST:  79m GADAVIST GADOBUTROL 1 MMOL/ML IV SOLN COMPARISON:  CT abdomen and pelvis 12/21/2021 FINDINGS: Lower chest: No acute findings. Hepatobiliary: Liver is normal in size and contour. There is a 13 mm hyperintense T2 signal lesion in the central right hepatic lobe which demonstrates early discontinuous and gradually increasing postcontrast enhancement, also stable in size since previous ultrasound consistent with a hemangioma. There are a few additional foci of early arterial parenchymal enhancement in the liver which measure up to 16 mm peripherally in segment 6, with no corresponding signal abnormalities on other sequences likely representing vascular anomalies. Gallbladder appears normal. Common bile duct is upper normal caliber measuring 7 mm with no intraluminal filling defects visualized. Pancreas: Mildly atrophic with no suspicious mass or ductal dilatation identified. Spleen:  Within normal limits in size and appearance. Adrenals/Urinary Tract: Bilateral adrenal nodules which demonstrate signal dropout on out of phase T1 consistent with adenomas measuring 0.9 cm on the right and 2.4 cm  on the left. Kidneys appear within normal limits. Stomach/Bowel: Small hiatal hernia. No evidence of bowel obstruction. Colonic diverticulosis. Vascular/Lymphatic: No pathologically enlarged lymph nodes identified. No abdominal aortic aneurysm demonstrated. Other:  No ascites. Musculoskeletal: No suspicious bone lesions identified. IMPRESSION: 1. Bilateral benign adrenal adenomas. 2. Small hepatic hemangioma and small likely hepatic parenchymal vascular anomalies. 3. Small hiatal hernia.  Colonic diverticulosis. Electronically Signed   By: DOfilia NeasM.D.   On: 04/27/2022 08:43      HISTORY:   Past Medical History:  Diagnosis Date   Allergic rhinitis    Anxiety    Back pain    Cancer (HCC)    prostate   CHF (congestive heart failure) ([place]    Colon polyps 08/2012   colonoscopy; multiple colon polyps; repeat colonoscopy in one year.   Complication of anesthesia    COPD (chronic obstructive pulmonary disease) ([place]    a. on home O2.   Coronary artery disease 11/2009   a. late presenting ant MI; b. LHC 100% pLAD s/p PCI/DES, 99% mRCA s/p PCI/DES, EF 35%; c. nuclear stress test 05/13: prior ant/inf infarcts w/o ischemia, EF 43%; d. LHC 02/14: widely patent stents with no other obs dz, EF 40%; e. LHC 11/17: LM nl, mLAD 10%, patent LAD stent, dLAD 20%, p-mRCA 10%, mRCA 40%, patent RCA stent, EF 35-45%    Depression    Dysphagia    Family history of adverse reaction to anesthesia    GERD (gastroesophageal reflux disease)    HWLSLHTDS(287.6    Helicobacter pylori (H. pylori)    Hemorrhoids    Hernia    inguinal   HFrEF (heart failure with reduced ejection fraction) ([place]    a. 10/2016 LV gram: EF 35-45%; b. 06/2018 Echo: EF 40-45%. c. 01/2021 Echo: EF 40-45%   Hyperlipidemia    Hypertension    Insomnia    Ischemic cardiomyopathy    a. 10/2016 LV gram: EF 35-45%; b. 06/2018 Echo: EF 40-45%.   Myalgia    Oxygen dependent    Peptic ulcer    Pulmonary nodules    ST elevation (STEMI)  myocardial infarction involving left anterior descending coronary artery ([place] 11/2009   a. s/p PCI to the LAD   Status post dilation of esophageal narrowing 2000   Thickening  of esophagus    Tinea pedis    Wears dentures    partial upper    Past Surgical History:  Procedure Laterality Date    coronary stents     Admission  12/20/2012   COPD exacerbation.  [place].   CARDIAC CATHETERIZATION     CARDIAC CATHETERIZATION  02/05/2013   [facility]   CARDIAC CATHETERIZATION  09/19/2013   [facility] : patent stents with no change in anatomy. EF: 40$   CARDIAC CATHETERIZATION Left 11/12/2016   Procedure: Left Heart Cath and Coronary Angiography;  Surgeon: Wellington Hampshire, MD;  Location: [facility];  Service: Cardiovascular;  Laterality: Left;   COLONOSCOPY     COLONOSCOPY WITH PROPOFOL N/A 05/18/2016   Procedure: COLONOSCOPY WITH PROPOFOL;  Surgeon: Lucilla Lame, MD;  Location: [facility] ENDOSCOPY;  Service: Endoscopy;  Laterality: N/A;   COLONOSCOPY WITH PROPOFOL N/A 03/19/2020   Procedure: COLONOSCOPY WITH PROPOFOL;  Surgeon: Lin Landsman, MD;  Location: [facility] ENDOSCOPY;  Service: Gastroenterology;  Laterality: N/A;   CORONARY ANGIOPLASTY WITH STENT PLACEMENT  09/19/2009   LAD 3.0 X23 mm Xience DES, RCA: 4.0 X 15 mm Xience DES   ELECTROMAGNETIC NAVIGATION BROCHOSCOPY N/A 10/27/2017   Procedure: ELECTROMAGNETIC NAVIGATION BRONCHOSCOPY;  Surgeon: Flora Lipps, MD;  Location: [facility] ORS;  Service: Cardiopulmonary;  Laterality: N/A;   ESOPHAGEAL DILATION     ESOPHAGOGASTRODUODENOSCOPY  12/20/2006   ESOPHAGOGASTRODUODENOSCOPY  08/20/2012   ESOPHAGOGASTRODUODENOSCOPY (EGD) WITH PROPOFOL N/A 04/19/2022   Procedure: ESOPHAGOGASTRODUODENOSCOPY (EGD) WITH PROPOFOL;  Surgeon: [name], MD;  Location: [facility] ENDOSCOPY;  Service: Endoscopy;  Laterality: N/A;   ESOPHAGOGASTRODUODENOSCOPY (EGD) WITH PROPOFOL N/A 04/27/2022   Procedure: ESOPHAGOGASTRODUODENOSCOPY (EGD) WITH PROPOFOL;  Surgeon:  [name], MD;  Location: [facility] ENDOSCOPY;  Service: Endoscopy;  Laterality: N/A;   EUS N/A 05/20/2022   Procedure: UPPER ENDOSCOPIC ULTRASOUND (EUS) LINEAR;  Surgeon: Reita Cliche, MD;  Location: [facility] ENDOSCOPY;  Service: Gastroenterology;  Laterality: N/A;  [facility]   HERNIA REPAIR  07/20/2012   L inguinal hernia repair   PENILE PROSTHESIS IMPLANT      Family History  Problem Relation Age of Onset   Heart attack Brother        Brother #1   Diabetes Brother    Hypertension Brother        #3   Coronary artery disease Father 29       deceased   Heart attack Father    Diabetes Father    Heart disease Father    COPD Mother 9       deceased   Alcohol abuse Sister        polysubstance abuse   COPD Sister    Lung cancer Sister    Alcohol abuse Sister        polysubstance abuse   Penile cancer Brother    Diabetes Brother    Prostate cancer Neg Hx    Bladder Cancer Neg Hx    Kidney cancer Neg Hx     Social History:  reports that he quit smoking about 3 years ago. His smoking use included cigarettes. He has a 126.00 pack-year smoking history. He has never used smokeless tobacco. He reports that he does not drink alcohol and does not use drugs.  Allergies:  Allergies  Allergen Reactions   Prozac [Fluoxetine Hcl] Shortness Of Breath   Hydroxyzine    Bupropion Other (See Comments)    "Makes me feel funny" Other reaction(s): Other (See Comments) Unknown/"made me feel real funny" "  Makes me feel funny"   Effexor Xr [Venlafaxine Hcl Er] Other (See Comments)    "Makes me feel funny"    Current Medications: Current Outpatient Medications  Medication Sig Dispense Refill   albuterol (VENTOLIN HFA) 108 (90 Base) MCG/ACT inhaler Inhale 2 puffs into the lungs every 6 (six) hours as needed for wheezing or shortness of breath. 18 g 4   aspirin 81 MG tablet Take 81 mg by mouth daily.     budesonide (PULMICORT) 0.5 MG/2ML nebulizer solution Take 2 mLs (0.5 mg total) by  nebulization in the morning and at bedtime. 320 mL 12   docusate sodium (COLACE) 100 MG capsule Take 1 capsule (100 mg total) by mouth 2 (two) times daily. 60 capsule 0   esomeprazole (NEXIUM) 40 MG capsule TAKE ONE CAPSULE BY MOUTH ONCE DAILY 30 capsule 4   fluticasone (FLONASE) 50 MCG/ACT nasal spray Place 2 sprays into both nostrils daily.     furosemide (LASIX) 40 MG tablet TAKE ONE TABLET BY MOUTH ONCE DAILY 90 tablet 2   ipratropium-albuterol (DUONEB) 0.5-2.5 (3) MG/3ML SOLN Take 3 mLs by nebulization 2 (two) times daily.     lidocaine-prilocaine (EMLA) cream Apply to affected area once 30 g 3   loratadine (CLARITIN) 10 MG tablet Take 10 mg by mouth daily.     LORazepam (ATIVAN) 1 MG tablet Take 1 tablet (1 mg total) by mouth every 4 (four) hours while awake. (Patient taking differently: Take 1 mg by mouth 3 (three) times daily.) 30 tablet 0   losartan (COZAAR) 25 MG tablet Take 25 mg by mouth daily.     mirtazapine (REMERON) 45 MG tablet Take 45 mg by mouth at bedtime.     Multiple Vitamin (MULTIVITAMIN) capsule Take 1 capsule by mouth daily.     OXYGEN Inhale 2.5 L into the lungs daily.     polyethylene glycol (MIRALAX) 17 g packet Take 17 g by mouth daily as needed.     predniSONE (DELTASONE) 20 MG tablet Take 1 tablet (20 mg total) by mouth daily for 8 doses. 8 tablet 0   prochlorperazine (COMPAZINE) 10 MG tablet Take 1 tablet (10 mg total) by mouth every 6 (six) hours as needed (Nausea or vomiting). 30 tablet 1   Spacer/Aero-Holding Chambers (AEROCHAMBER MV) inhaler Use as instructed 1 each 2   sucralfate (CARAFATE) 1 g tablet Take 1 g by mouth 2 (two) times daily with a meal.     atorvastatin (LIPITOR) 80 MG tablet Take 1 tablet (80 mg total) by mouth daily. 90 tablet 3   nitroGLYCERIN (NITROSTAT) 0.4 MG SL tablet Place 1 tablet (0.4 mg total) under the tongue every 5 (five) minutes as needed. (Patient not taking: Reported on 07/09/2022) 25 tablet 3   No current facility-administered  medications for this visit.

## 2022-07-16 NOTE — Assessment & Plan Note (Signed)
He is on concurrent chemotherapy carboplatin/Taxol weekly with radiation. Labs reviewed and discussed with patient. Proceed with carboplatin/Taxol

## 2022-07-16 NOTE — Assessment & Plan Note (Signed)
Recent COPD exacerbation. Finish course of steroid.  He is on nasal cannula oxygen.

## 2022-07-16 NOTE — Assessment & Plan Note (Signed)
Gleason score 3+4, status post definitive radiation. Continue follow-up with urology Dr. Sninsky.    

## 2022-07-16 NOTE — Progress Notes (Signed)
Nutrition Follow-up:  Patient with esophageal cancer.  Patient receiving concurrent chemotherapy and radiation.  Noted hospital admission for COPD exacerbation.  Met with patient during infusion.  Says that he is eating and foods are staying down.  Says that he was able to eat the chopped pot roast and mashed potatoes in the hospital.  Says that he is drinking maybe 1-2 500 calories boost per day and 1-2 350 calorie shakes.  Ate vegetable soup yesterday and roasted turkey sandwich.    Patient will start home health PT next week.     Medications: reviewed  Labs: reviewed  Anthropometrics:  Weight 172 lb today  177 lb on 7/14 178 lb 8 oz on 6/14 200 lb on 3/12    NUTRITION DIAGNOSIS: Inadequate oral intake continues   INTERVENTION:  Recommend increasing 500 calorie shake to at least BID and continue 350 calorie at least BID Reviewed ways to add calories to foods and make them moist.      MONITORING, EVALUATION, GOAL: weight trends, intake   NEXT VISIT: Friday, August 4th during infusion   B. , RD, LDN Registered Dietitian [phone]   

## 2022-07-19 ENCOUNTER — Ambulatory Visit
Admission: RE | Admit: 2022-07-19 | Discharge: 2022-07-19 | Disposition: A | Discharge status: Home / Self Care | Payer: Medicare Other | Source: Ambulatory Visit | Attending: Radiation Oncology | Admitting: Radiation Oncology

## 2022-07-19 ENCOUNTER — Other Ambulatory Visit: Payer: Self-pay

## 2022-07-19 DIAGNOSIS — Z5111 Encounter for antineoplastic chemotherapy: Secondary | ICD-10-CM | POA: Diagnosis not present

## 2022-07-19 LAB — RAD ONC ARIA SESSION SUMMARY
Course Elapsed Days: 35
Plan Fractions Treated to Date: 23
Plan Prescribed Dose Per Fraction: 1.8 Gy
Plan Total Fractions Prescribed: 30
Plan Total Prescribed Dose: 54 Gy
Reference Point Dosage Given to Date: 41.4 Gy
Reference Point Session Dosage Given: 1.8 Gy
Session Number: 23

## 2022-07-20 ENCOUNTER — Other Ambulatory Visit: Payer: Self-pay

## 2022-07-20 ENCOUNTER — Other Ambulatory Visit: Payer: Self-pay | Admitting: *Deleted

## 2022-07-20 ENCOUNTER — Ambulatory Visit
Admission: RE | Admit: 2022-07-20 | Discharge: 2022-07-20 | Disposition: A | Discharge status: Home / Self Care | Payer: Medicare Other | Source: Ambulatory Visit | Attending: Radiation Oncology | Admitting: Radiation Oncology

## 2022-07-20 ENCOUNTER — Encounter: Payer: Self-pay | Admitting: Hospice and Palliative Medicine

## 2022-07-20 ENCOUNTER — Telehealth: Payer: Self-pay | Admitting: Cardiovascular Disease

## 2022-07-20 ENCOUNTER — Inpatient Hospital Stay: Payer: Medicare Other | Attending: Oncology | Admitting: Hospice and Palliative Medicine

## 2022-07-20 VITALS — BP 152/76 | HR 100 | Temp 97.9°F | Resp 18 | Wt 180.0 lb

## 2022-07-20 DIAGNOSIS — I110 Hypertensive heart disease with heart failure: Secondary | ICD-10-CM | POA: Insufficient documentation

## 2022-07-20 DIAGNOSIS — J449 Chronic obstructive pulmonary disease, unspecified: Secondary | ICD-10-CM | POA: Insufficient documentation

## 2022-07-20 DIAGNOSIS — C159 Malignant neoplasm of esophagus, unspecified: Secondary | ICD-10-CM | POA: Diagnosis not present

## 2022-07-20 DIAGNOSIS — I252 Old myocardial infarction: Secondary | ICD-10-CM | POA: Insufficient documentation

## 2022-07-20 DIAGNOSIS — Z79899 Other long term (current) drug therapy: Secondary | ICD-10-CM | POA: Insufficient documentation

## 2022-07-20 DIAGNOSIS — K219 Gastro-esophageal reflux disease without esophagitis: Secondary | ICD-10-CM | POA: Diagnosis not present

## 2022-07-20 DIAGNOSIS — Z5111 Encounter for antineoplastic chemotherapy: Secondary | ICD-10-CM | POA: Insufficient documentation

## 2022-07-20 DIAGNOSIS — R079 Chest pain, unspecified: Secondary | ICD-10-CM | POA: Diagnosis not present

## 2022-07-20 DIAGNOSIS — Z8546 Personal history of malignant neoplasm of prostate: Secondary | ICD-10-CM | POA: Insufficient documentation

## 2022-07-20 DIAGNOSIS — R0789 Other chest pain: Secondary | ICD-10-CM | POA: Insufficient documentation

## 2022-07-20 DIAGNOSIS — C155 Malignant neoplasm of lower third of esophagus: Secondary | ICD-10-CM | POA: Insufficient documentation

## 2022-07-20 DIAGNOSIS — Z510 Encounter for antineoplastic radiation therapy: Secondary | ICD-10-CM | POA: Insufficient documentation

## 2022-07-20 DIAGNOSIS — R0602 Shortness of breath: Secondary | ICD-10-CM | POA: Insufficient documentation

## 2022-07-20 DIAGNOSIS — Z87891 Personal history of nicotine dependence: Secondary | ICD-10-CM | POA: Insufficient documentation

## 2022-07-20 DIAGNOSIS — E785 Hyperlipidemia, unspecified: Secondary | ICD-10-CM | POA: Diagnosis not present

## 2022-07-20 DIAGNOSIS — Z9981 Dependence on supplemental oxygen: Secondary | ICD-10-CM | POA: Insufficient documentation

## 2022-07-20 LAB — RAD ONC ARIA SESSION SUMMARY
Course Elapsed Days: 36
Plan Fractions Treated to Date: 24
Plan Prescribed Dose Per Fraction: 1.8 Gy
Plan Total Fractions Prescribed: 30
Plan Total Prescribed Dose: 54 Gy
Reference Point Dosage Given to Date: 43.2 Gy
Reference Point Session Dosage Given: 1.8 Gy
Session Number: 24

## 2022-07-20 MED ORDER — PREDNISONE 10 MG PO TABS: 10.0000 mg | ORAL_TABLET | Freq: Every day | ORAL | 0 refills | Status: DC

## 2022-07-20 NOTE — Progress Notes (Signed)
Pt in for symptom management of continued left chest pain.  Pt describes it is intermittent and can be dull and shooting.

## 2022-07-20 NOTE — Telephone Encounter (Signed)
LVM TO SCHEDULE THIS FRIDAY WITH DR ARIDA AT 10:20. ASKED PT TO CALL BACK TO CONFIRM

## 2022-07-20 NOTE — Telephone Encounter (Signed)
Pt is returning call. Requesting call back.  Pt says that day will not work due to chemo and radiation on that day.

## 2022-07-20 NOTE — Progress Notes (Signed)
Symptom Management and [place] at [facility] [phone] [phone]  Patient Care Team: [name], MD as PCP - General (Family Medicine) Wellington Hampshire, MD as PCP - Cardiology (Cardiology) [name], MD (Family Medicine) Wellington Hampshire, MD as Consulting Physician (Cardiology) Telford Nab, RN as Registered Nurse [name], MD as Consulting Physician (Pulmonary Disease) Noreene Filbert, MD as Radiation Oncologist (Radiation Oncology) Clent Jacks, RN as Oncology Nurse Navigator   NAME OF PATIENT: Leslie Sims  [id number]  [birth year]   DATE OF VISIT: 07/20/22  REASON FOR CONSULT: Jonerik Sliker is a 71 y.o. 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   Patient was hospitalized 07/09/2022 to 07/12/2022 with atypical chest pain and shortness of breath and was treated for COPD exacerbation.  INTERVAL HISTORY: Patient presents to clinic today for evaluation of chest pain/pressure that he says has been persistent since his hospitalization.  He says that it is left chest/lateral side and often hurts with movement.  He has chronic shortness of breath but says that that is unchanged.  No fever or chills.  He says he is eating well and is having no esophageal pain or dysphagia.  Denies any neurologic complaints. Denies recent fevers or illnesses. Denies any easy bleeding or bruising. Reports good appetite and denies weight loss.  Denies any nausea, vomiting, constipation, or diarrhea. Denies urinary complaints. Patient offers no further specific complaints today.  SOCIAL HISTORY:     reports that he quit smoking about 3 years ago. His smoking use included cigarettes. He has a 126.00 pack-year smoking history. He has never  used smokeless tobacco. He reports that he does not drink alcohol and does not use drugs.  ADVANCE DIRECTIVES:    CODE STATUS:    PAST MEDICAL HISTORY: Past Medical History:  Diagnosis Date   Allergic rhinitis    Anxiety    Back pain    Cancer (HCC)    prostate   CHF (congestive heart failure) ([place])    Colon polyps 08/2012   colonoscopy; multiple colon polyps; repeat colonoscopy in one year.   Complication of anesthesia    COPD (chronic obstructive pulmonary disease) ([place])    a. on home O2.   Coronary artery disease 11/2009   a. late presenting ant MI; b. LHC 100% pLAD s/p PCI/DES, 99% mRCA s/p PCI/DES, EF 35%; c. nuclear stress test 05/13: prior ant/inf infarcts w/o ischemia, EF 43%; d. LHC 02/14: widely patent stents with no other obs dz, EF 40%; e. LHC 11/17: LM nl, mLAD 10%, patent LAD stent, dLAD 20%, p-mRCA 10%, mRCA 40%, patent RCA stent, EF 35-45%    Depression    Dysphagia    Family history of adverse reaction to anesthesia    GERD (gastroesophageal reflux disease)    [id number](027.2)    Helicobacter pylori (H. pylori)    Hemorrhoids    Hernia    inguinal   HFrEF (heart failure with reduced ejection fraction) ([place])    a. 10/2016 LV gram: EF 35-45%; b. 06/2018 Echo: EF 40-45%. c. 01/2021 Echo: EF 40-45%   Hyperlipidemia    Hypertension    Insomnia    Ischemic cardiomyopathy    a. 10/2016 LV gram: EF 35-45%; b. 06/2018 Echo: EF 40-45%.   Myalgia    Oxygen dependent  Peptic ulcer    Pulmonary nodules    ST elevation (STEMI) myocardial infarction involving left anterior descending coronary artery ([place]) 11/2009   a. s/p PCI to the LAD   Status post dilation of esophageal narrowing 2000   Thickening of esophagus    Tinea pedis    Wears dentures    partial upper    PAST SURGICAL HISTORY:  Past Surgical History:  Procedure Laterality Date    coronary stents     Admission  12/20/2012   COPD exacerbation.  [place].   CARDIAC CATHETERIZATION     CARDIAC  CATHETERIZATION  02/05/2013   [facility]   CARDIAC CATHETERIZATION  09/19/2013   [facility] : patent stents with no change in anatomy. EF: 40$   CARDIAC CATHETERIZATION Left 11/12/2016   Procedure: Left Heart Cath and Coronary Angiography;  Surgeon: Wellington Hampshire, MD;  Location: [place] [facility];  Service: Cardiovascular;  Laterality: Left;   COLONOSCOPY     COLONOSCOPY WITH PROPOFOL N/A 05/18/2016   Procedure: COLONOSCOPY WITH PROPOFOL;  Surgeon: Lucilla Lame, MD;  Location: [facility] ENDOSCOPY;  Service: Endoscopy;  Laterality: N/A;   COLONOSCOPY WITH PROPOFOL N/A 03/19/2020   Procedure: COLONOSCOPY WITH PROPOFOL;  Surgeon: Lin Landsman, MD;  Location: [facility] ENDOSCOPY;  Service: Gastroenterology;  Laterality: N/A;   CORONARY ANGIOPLASTY WITH STENT PLACEMENT  09/19/2009   LAD 3.0 X23 mm Xience DES, RCA: 4.0 X 15 mm Xience DES   ELECTROMAGNETIC NAVIGATION BROCHOSCOPY N/A 10/27/2017   Procedure: ELECTROMAGNETIC NAVIGATION BRONCHOSCOPY;  Surgeon: Flora Lipps, MD;  Location: [facility] ORS;  Service: Cardiopulmonary;  Laterality: N/A;   ESOPHAGEAL DILATION     ESOPHAGOGASTRODUODENOSCOPY  12/20/2006   ESOPHAGOGASTRODUODENOSCOPY  08/20/2012   ESOPHAGOGASTRODUODENOSCOPY (EGD) WITH PROPOFOL N/A 04/19/2022   Procedure: ESOPHAGOGASTRODUODENOSCOPY (EGD) WITH PROPOFOL;  Surgeon: [name], MD;  Location: [facility] ENDOSCOPY;  Service: Endoscopy;  Laterality: N/A;   ESOPHAGOGASTRODUODENOSCOPY (EGD) WITH PROPOFOL N/A 04/27/2022   Procedure: ESOPHAGOGASTRODUODENOSCOPY (EGD) WITH PROPOFOL;  Surgeon: [name], MD;  Location: [facility] ENDOSCOPY;  Service: Endoscopy;  Laterality: N/A;   EUS N/A 05/20/2022   Procedure: UPPER ENDOSCOPIC ULTRASOUND (EUS) LINEAR;  Surgeon: Reita Cliche, MD;  Location: [facility] ENDOSCOPY;  Service: Gastroenterology;  Laterality: N/A;  [facility]   HERNIA REPAIR  07/20/2012   L inguinal hernia repair   PENILE PROSTHESIS IMPLANT      HEMATOLOGY/ONCOLOGY HISTORY:  Oncology History   Prostate cancer ([place])  10/11/2020 Initial Diagnosis   Prostate cancer ([place])   05/04/2022 Cancer Staging   Staging form: Prostate, AJCC 8th Edition - Clinical: cT2c, cN0, cM0, Grade Group: 2 - Signed by [name], MD on 05/04/2022 Gleason score: 7 Histologic grading system: 5 grade system   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 showed partially obstructive likely malignant esophageal tumor was found at [place] junction.  Biopsied.  Normal stomach and normal examined duodenum. Biopsy pathology showed fragments of the squamous mucosa with high-grade dysplastic.  Scant gastric mucosa present with reactive changes and no dysplastic or significant intestinal metaplastic.  Fragments of fibrillar purulent ulcer diabetes also present.  04/27/2022, repeat EGD and a biopsy of GE junction partially obstructed ulcerating mass. Repeat biopsy pathology showed superficial and disaggregated fragments of squamous mucosa with at least severe dysplastic/squamous cell carcinoma in situ.  Definite invasion cannot be identified.  In part secondary to specimen fragmentation and tangential sectioning.    04/27/2022 Imaging   MRI abdomen MRCP with and without  contrast showed bilateral benign adrenal adenoma.  Small hepatic hemangioma and small right hepatic parenchymal vascular anomalies. Small hiatal hernia.  Colonic diverticulosis.   05/04/2022 Initial Diagnosis   Primary esophageal squamous cell carcinoma  + Stomach discomfort for 4 months, dysphagia for both solids and liquids at times. + Weight loss   05/04/2022 Cancer Staging   Staging form: Esophagus - Squamous Cell Carcinoma, AJCC 8th Edition - Clinical: Stage II (cT3, cN0, cM0, L: Lower) - Signed by [name], MD on 05/31/2022 Stage prefix: Initial diagnosis Total positive nodes: 0   05/09/2022 Imaging   PET scan showed intense tracer uptake associated with the distal esophageal mass just above the  level of the GE junction compatible with primary esophageal neoplasm. No signs of tracer avid nodal metastasis or solid organ metastasis.   05/20/2022 Procedure   EUS by Dr.Spaete.  T3,Nx 05/20/2022, esophagus mass biopsy showed invasive moderately differentiated squamous cell carcinoma, with basaloid features and minimal keratinization.  Background squamous cell carcinoma in situ.   06/03/2022 -  Chemotherapy   Patient is on Treatment Plan :  ESOPHAGUS Carboplatin/PACLitaxel weekly x 6 weeks with XRT       07/09/2022 - 07/12/2022 Hospital Admission   Hospitalization due to COPD exacerbation.       ALLERGIES:  is allergic to prozac [fluoxetine hcl], hydroxyzine, bupropion, and effexor xr [venlafaxine hcl er].  MEDICATIONS:  Current Outpatient Medications  Medication Sig Dispense Refill   albuterol (VENTOLIN HFA) 108 (90 Base) MCG/ACT inhaler Inhale 2 puffs into the lungs every 6 (six) hours as needed for wheezing or shortness of breath. 18 g 4   aspirin 81 MG tablet Take 81 mg by mouth daily.     atorvastatin (LIPITOR) 80 MG tablet Take 1 tablet (80 mg total) by mouth daily. 90 tablet 3   budesonide (PULMICORT) 0.5 MG/2ML nebulizer solution Take 2 mLs (0.5 mg total) by nebulization in the morning and at bedtime. 320 mL 12   docusate sodium (COLACE) 100 MG capsule Take 1 capsule (100 mg total) by mouth 2 (two) times daily. 60 capsule 0   esomeprazole (NEXIUM) 40 MG capsule TAKE ONE CAPSULE BY MOUTH ONCE DAILY 30 capsule 4   fluticasone (FLONASE) 50 MCG/ACT nasal spray Place 2 sprays into both nostrils daily.     furosemide (LASIX) 40 MG tablet TAKE ONE TABLET BY MOUTH ONCE DAILY 90 tablet 2   ipratropium-albuterol (DUONEB) 0.5-2.5 (3) MG/3ML SOLN Take 3 mLs by nebulization 2 (two) times daily.     lidocaine-prilocaine (EMLA) cream Apply to affected area once 30 g 3   loratadine (CLARITIN) 10 MG tablet Take 10 mg by mouth daily.     LORazepam (ATIVAN) 1 MG tablet Take 1 tablet (1 mg total) by  mouth every 4 (four) hours while awake. (Patient taking differently: Take 1 mg by mouth 3 (three) times daily.) 30 tablet 0   losartan (COZAAR) 25 MG tablet Take 25 mg by mouth daily.     mirtazapine (REMERON) 45 MG tablet Take 45 mg by mouth at bedtime.     Multiple Vitamin (MULTIVITAMIN) capsule Take 1 capsule by mouth daily.     OXYGEN Inhale 2.5 L into the lungs daily.     polyethylene glycol (MIRALAX) 17 g packet Take 17 g by mouth daily as needed.     Spacer/Aero-Holding Chambers (AEROCHAMBER MV) inhaler Use as instructed 1 each 2   sucralfate (CARAFATE) 1 g tablet Take 1 g by mouth 2 (two) times daily with a  meal.     nitroGLYCERIN (NITROSTAT) 0.4 MG SL tablet Place 1 tablet (0.4 mg total) under the tongue every 5 (five) minutes as needed. (Patient not taking: Reported on 07/20/2022) 25 tablet 3   prochlorperazine (COMPAZINE) 10 MG tablet Take 1 tablet (10 mg total) by mouth every 6 (six) hours as needed (Nausea or vomiting). (Patient not taking: Reported on 07/20/2022) 30 tablet 1   No current facility-administered medications for this visit.    VITAL SIGNS: BP (!) 152/76 (BP Location: Left Arm, Patient Position: Sitting)   Pulse 100   Temp 97.9 F (36.6 C) (Tympanic)   Resp 18   Wt 180 lb (81.6 kg)   SpO2 100%   BMI 25.83 kg/m  Filed Weights   07/20/22 1339  Weight: 180 lb (81.6 kg)    Estimated body mass index is 25.83 kg/m as calculated from the following:   Height as of 07/09/22: '5\' 10"'$  (1.778 m).   Weight as of this encounter: 180 lb (81.6 kg).  LABS: CBC:    Component Value Date/Time   WBC 7.0 07/16/2022 0907   HGB 13.3 07/16/2022 0907   HGB 15.1 05/22/2018 1601   HCT 41.6 07/16/2022 0907   HCT 44.3 05/22/2018 1601   PLT 144 (L) 07/16/2022 0907   PLT 287 05/22/2018 1601   MCV 91.6 07/16/2022 0907   MCV 88 05/22/2018 1601   MCV 86 01/11/2013 0546   NEUTROABS 5.5 07/16/2022 0907   NEUTROABS 5.8 05/22/2018 1601   NEUTROABS 8.5 (H) 01/11/2013 0546   LYMPHSABS  0.4 (L) 07/16/2022 0907   LYMPHSABS 1.8 05/22/2018 1601   LYMPHSABS 0.5 (L) 01/11/2013 0546   MONOABS 0.9 07/16/2022 0907   MONOABS 0.7 01/11/2013 0546   EOSABS 0.0 07/16/2022 0907   EOSABS 0.1 05/22/2018 1601   EOSABS 0.0 01/11/2013 0546   BASOSABS 0.0 07/16/2022 0907   BASOSABS 0.0 05/22/2018 1601   BASOSABS 0.0 01/11/2013 0546   Comprehensive Metabolic Panel:    Component Value Date/Time   NA 141 07/16/2022 0907   NA 141 05/22/2018 1601   NA 140 01/11/2013 0546   K 3.8 07/16/2022 0907   K 4.4 01/11/2013 0546   CL 99 07/16/2022 0907   CL 105 01/11/2013 0546   CO2 34 (H) 07/16/2022 0907   CO2 31 01/11/2013 0546   BUN 17 07/16/2022 0907   BUN 20 05/22/2018 1601   BUN 10 01/11/2013 0546   CREATININE 0.93 07/16/2022 0907   CREATININE 0.92 10/13/2016 1241   GLUCOSE 128 (H) 07/16/2022 0907   GLUCOSE 145 (H) 01/11/2013 0546   CALCIUM 9.6 07/16/2022 0907   CALCIUM 9.2 01/11/2013 0546   AST 17 07/16/2022 0907   AST 19 01/10/2013 0407   ALT 22 07/16/2022 0907   ALT 22 01/10/2013 0407   ALKPHOS 69 07/16/2022 0907   ALKPHOS 99 01/10/2013 0407   BILITOT 0.7 07/16/2022 0907   BILITOT 0.2 05/22/2018 1601   BILITOT 0.3 01/10/2013 0407   PROT 7.0 07/16/2022 0907   PROT 6.7 05/22/2018 1601   PROT 6.7 01/10/2013 0407   ALBUMIN 4.1 07/16/2022 0907   ALBUMIN 4.5 05/22/2018 1601   ALBUMIN 3.6 01/10/2013 0407    RADIOGRAPHIC STUDIES: DG Chest Port 1 View  Result Date: 07/09/2022 CLINICAL DATA:  Chest pain, shortness of breath over the past week EXAM: PORTABLE CHEST 1 VIEW COMPARISON:  03/15/2022, 07/09/2022 FINDINGS: 7 mm right upper lobe pulmonary nodule. Faintly visualized 9 mm left upper lobe pulmonary nodule. Bilateral upper lobe pulmonary nodules  were better characterized on the CT of the chest dated 07/09/2022 performed earlier same day. Bilateral mild interstitial thickening. Bilateral emphysematous changes. No focal consolidation. No pleural effusion or pneumothorax. Heart and  mediastinal contours are unremarkable. No acute osseous abnormality. IMPRESSION: 1. No acute cardiopulmonary disease. Electronically Signed   By: Kathreen Devoid M.D.   On: 07/09/2022 10:40   CT Angio Chest PE W and/or Wo Contrast  Result Date: 07/09/2022 CLINICAL DATA:  Pulmonary embolism high probability, shortness of breath and chest pain. History of esophageal neoplasm. * Tracking Code: BO * EXAM: CT ANGIOGRAPHY CHEST WITH CONTRAST TECHNIQUE: Multidetector CT imaging of the chest was performed using the standard protocol during bolus administration of intravenous contrast. Multiplanar CT image reconstructions and MIPs were obtained to evaluate the vascular anatomy. RADIATION DOSE REDUCTION: This exam was performed according to the departmental dose-optimization program which includes automated exposure control, adjustment of the mA and/or kV according to patient size and/or use of iterative reconstruction technique. CONTRAST:  32m OMNIPAQUE IOHEXOL 350 MG/ML SOLN COMPARISON:  PET-CT May 07, 2022. FINDINGS: Cardiovascular: Satisfactory opacification of the pulmonary arteries to the segmental level. No evidence of pulmonary embolism. Aortic atherosclerosis. Normal heart size. No pericardial effusion. Mediastinum/Nodes: No suspicious thyroid nodule. No pathologically enlarged mediastinal, hilar or axillary lymph nodes. Stable calcified 15 mm nodule in the right axilla. Marked distal esophageal wall thickening with patulous gas and fluid filled proximal esophagus similar prior PET-CT. Lungs/Pleura: Stable 9 mm left upper lobe pulmonary nodule which was not hypermetabolic on prior PET-CT. No new suspicious pulmonary nodules or masses. Bilateral calcified granulomata. Stable upper lobe predominant paraseptal and centrilobular emphysematous change with multifocal scarring including a bandlike area in the right upper lobe. Upper Abdomen: No acute abnormality.  Calcified splenic granulomata. Musculoskeletal: No  aggressive lytic or blastic lesion of bone. Multilevel degenerative changes spine. Review of the MIP images confirms the above findings. IMPRESSION: 1. No evidence of pulmonary embolus. 2. Similar appearance of the masslike distal esophageal wall thickening consistent with patient's known esophageal carcinoma. 3. Similar patulous appearance of the proximal esophagus with fluid and gas filling the esophagus, almost certainly reflecting a measure of distal esophageal obstruction, quantification of which can not be determined on this examination. 4. No evidence of metastatic disease in the chest. 5. Stable emphysematous change and multifocal pulmonary scarring. 6. Aortic Atherosclerosis (ICD10-I70.0) and Emphysema (ICD10-J43.9). Electronically Signed   By: JDahlia BailiffM.D.   On: 07/09/2022 10:22    PERFORMANCE STATUS (ECOG) : 1 - Symptomatic but completely ambulatory  Review of Systems Unless otherwise noted, a complete review of systems is negative.  Physical Exam General: NAD Cardiovascular: regular rate and rhythm Pulmonary: clear ant fields Abdomen: soft, nontender, + bowel sounds GU: no suprapubic tenderness Extremities: no edema, no joint deformities Skin: no rashes Neurological: Weakness but otherwise nonfocal  IMPRESSION: Patient describes pain as being persistent for the past 2 weeks and is the same in characteristic in severity that led to his hospitalization.  At that time, he had a negative CTA and was treated for COPD exacerbation.  Patient does have history of CAD and is followed by Dr. AFletcher Anon  However, pain today is easily and consistently reproducible with palpation to the ribs.  I did check an EKG today, which was reviewed by Dr. AFletcher Anonand does not appear to have clinical change.  Low suspicion that this is cardiac.  I am more suspicious that there could be musculoskeletal etiology.  Discussed with Dr. YTasia Catchingsand will  trial patient on course of prednisone.  However, will also send for  chest x-ray/rib series for further evaluation.  Dr. Fletcher Anon kindly agreed to have in clinic follow-up with patient next week.   PLAN: -Continue current scope of treatment -EKG today -Follow-up with cardiology -Send for chest x-ray/rib series -Start prednisone 10 mg daily x1 week -RTC next week to see Dr. Tasia Catchings  Case and plan discussed with Dr. Tasia Catchings and Dr. Fletcher Anon   Patient expressed understanding and was in agreement with this plan. He also understands that He can call clinic at any time with any questions, concerns, or complaints.   Thank you for allowing me to participate in the care of this very pleasant patient.   Time Total: 20 minutes  Visit consisted of counseling and education dealing with the complex and emotionally intense issues of symptom management in the setting of serious illness.Greater than 50%  of this time was spent counseling and coordinating care related to the above assessment and plan.  Signed by: Altha Harm, PhD, NP-C

## 2022-07-21 ENCOUNTER — Ambulatory Visit
Admission: RE | Admit: 2022-07-21 | Discharge: 2022-07-21 | Disposition: A | Discharge status: Home / Self Care | Payer: Medicare Other | Source: Ambulatory Visit | Attending: Radiation Oncology | Admitting: Radiation Oncology

## 2022-07-21 ENCOUNTER — Ambulatory Visit
Admission: RE | Admit: 2022-07-21 | Discharge: 2022-07-21 | Disposition: A | Discharge status: Home / Self Care | Payer: Medicare Other | Source: Ambulatory Visit | Attending: Hospice and Palliative Medicine | Admitting: Hospice and Palliative Medicine

## 2022-07-21 ENCOUNTER — Telehealth: Payer: Self-pay | Admitting: *Deleted

## 2022-07-21 ENCOUNTER — Ambulatory Visit
Admission: RE | Admit: 2022-07-21 | Discharge: 2022-07-21 | Disposition: A | Discharge status: Home / Self Care | Payer: Medicare Other | Attending: Family Medicine | Admitting: Family Medicine

## 2022-07-21 ENCOUNTER — Other Ambulatory Visit: Payer: Self-pay

## 2022-07-21 DIAGNOSIS — C159 Malignant neoplasm of esophagus, unspecified: Secondary | ICD-10-CM | POA: Insufficient documentation

## 2022-07-21 DIAGNOSIS — Z510 Encounter for antineoplastic radiation therapy: Secondary | ICD-10-CM | POA: Diagnosis not present

## 2022-07-21 LAB — RAD ONC ARIA SESSION SUMMARY
Course Elapsed Days: 37
Plan Fractions Treated to Date: 25
Plan Prescribed Dose Per Fraction: 1.8 Gy
Plan Total Fractions Prescribed: 30
Plan Total Prescribed Dose: 54 Gy
Reference Point Dosage Given to Date: 45 Gy
Reference Point Session Dosage Given: 1.8 Gy
Session Number: 25

## 2022-07-21 NOTE — Telephone Encounter (Signed)
Spoke with patient. He is currently declining Home Health until after he finishes his radiation therapy. Pt aware that he should follow-up with Dr. Fletcher Anon for his cardiology care. Pt understands that the chest xray did not reveal any acute abnormalities. He thanked me for calling and following up with him.

## 2022-07-21 NOTE — Telephone Encounter (Addendum)
  Msg from [place], [place] -would you please call patient and check on him.  Home health has been trying to go out to see him but patient has been refusing.  Will you please tell him that we would strongly recommend home health involvement.  Thank you! Also, CXR did not reveal anything acute

## 2022-07-22 ENCOUNTER — Ambulatory Visit
Admission: RE | Admit: 2022-07-22 | Discharge: 2022-07-22 | Disposition: A | Discharge status: Home / Self Care | Payer: Medicare Other | Source: Ambulatory Visit | Attending: Radiation Oncology | Admitting: Radiation Oncology

## 2022-07-22 ENCOUNTER — Other Ambulatory Visit: Payer: Self-pay

## 2022-07-22 DIAGNOSIS — Z510 Encounter for antineoplastic radiation therapy: Secondary | ICD-10-CM | POA: Diagnosis not present

## 2022-07-22 LAB — RAD ONC ARIA SESSION SUMMARY
Course Elapsed Days: 38
Plan Fractions Treated to Date: 26
Plan Prescribed Dose Per Fraction: 1.8 Gy
Plan Total Fractions Prescribed: 30
Plan Total Prescribed Dose: 54 Gy
Reference Point Dosage Given to Date: 46.8 Gy
Reference Point Session Dosage Given: 1.8 Gy
Session Number: 26

## 2022-07-22 MED FILL — Dexamethasone Sodium Phosphate Inj 100 MG/10ML: INTRAMUSCULAR | Qty: 1 | Status: AC

## 2022-07-23 ENCOUNTER — Inpatient Hospital Stay: Payer: Medicare Other

## 2022-07-23 ENCOUNTER — Ambulatory Visit
Admission: RE | Admit: 2022-07-23 | Discharge: 2022-07-23 | Disposition: A | Discharge status: Home / Self Care | Payer: Medicare Other | Source: Ambulatory Visit | Attending: Radiation Oncology | Admitting: Radiation Oncology

## 2022-07-23 ENCOUNTER — Inpatient Hospital Stay (HOSPITAL_BASED_OUTPATIENT_CLINIC_OR_DEPARTMENT_OTHER): Payer: Medicare Other | Admitting: Oncology

## 2022-07-23 ENCOUNTER — Other Ambulatory Visit: Payer: Self-pay

## 2022-07-23 ENCOUNTER — Encounter: Payer: Self-pay | Admitting: Oncology

## 2022-07-23 VITALS — BP 128/73 | HR 93 | Temp 97.6°F | Resp 16 | Wt 178.5 lb

## 2022-07-23 DIAGNOSIS — T451X5A Adverse effect of antineoplastic and immunosuppressive drugs, initial encounter: Secondary | ICD-10-CM

## 2022-07-23 DIAGNOSIS — R918 Other nonspecific abnormal finding of lung field: Secondary | ICD-10-CM | POA: Diagnosis not present

## 2022-07-23 DIAGNOSIS — C159 Malignant neoplasm of esophagus, unspecified: Secondary | ICD-10-CM

## 2022-07-23 DIAGNOSIS — R0789 Other chest pain: Secondary | ICD-10-CM

## 2022-07-23 DIAGNOSIS — R634 Abnormal weight loss: Secondary | ICD-10-CM

## 2022-07-23 DIAGNOSIS — D6481 Anemia due to antineoplastic chemotherapy: Secondary | ICD-10-CM

## 2022-07-23 DIAGNOSIS — Z510 Encounter for antineoplastic radiation therapy: Secondary | ICD-10-CM | POA: Diagnosis not present

## 2022-07-23 DIAGNOSIS — R1319 Other dysphagia: Secondary | ICD-10-CM

## 2022-07-23 DIAGNOSIS — Z5111 Encounter for antineoplastic chemotherapy: Secondary | ICD-10-CM

## 2022-07-23 LAB — RAD ONC ARIA SESSION SUMMARY
Course Elapsed Days: 39
Plan Fractions Treated to Date: 27
Plan Prescribed Dose Per Fraction: 1.8 Gy
Plan Total Fractions Prescribed: 30
Plan Total Prescribed Dose: 54 Gy
Reference Point Dosage Given to Date: 48.6 Gy
Reference Point Session Dosage Given: 1.8 Gy
Session Number: 27

## 2022-07-23 LAB — COMPREHENSIVE METABOLIC PANEL
ALT: 25 U/L (ref 0–44)
AST: 20 U/L (ref 15–41)
Albumin: 4 g/dL (ref 3.5–5.0)
Alkaline Phosphatase: 60 U/L (ref 38–126)
Anion gap: 8 (ref 5–15)
BUN: 16 mg/dL (ref 8–23)
CO2: 33 mmol/L — ABNORMAL HIGH (ref 22–32)
Calcium: 9.2 mg/dL (ref 8.9–10.3)
Chloride: 100 mmol/L (ref 98–111)
Creatinine, Ser: 0.84 mg/dL (ref 0.61–1.24)
GFR, Estimated: >60 mL/min (ref >60)
Glucose, Bld: 100 mg/dL — ABNORMAL HIGH (ref 70–99)
Potassium: 3.5 mmol/L (ref 3.5–5.1)
Sodium: 141 mmol/L (ref 135–145)
Total Bilirubin: 0.2 mg/dL — ABNORMAL LOW (ref 0.3–1.2)
Total Protein: 6.9 g/dL (ref 6.5–8.1)

## 2022-07-23 LAB — CBC WITH DIFFERENTIAL/PLATELET
Abs Immature Granulocytes: 0.04 10*3/uL (ref 0.00–0.07)
Basophils Absolute: 0 10*3/uL (ref 0.0–0.1)
Basophils Relative: 1 %
Eosinophils Absolute: 0.1 10*3/uL (ref 0.0–0.5)
Eosinophils Relative: 1 %
HCT: 39.5 % (ref 39.0–52.0)
Hemoglobin: 12.6 g/dL — ABNORMAL LOW (ref 13.0–17.0)
Immature Granulocytes: 1 %
Lymphocytes Relative: 6 %
Lymphs Abs: 0.3 10*3/uL — ABNORMAL LOW (ref 0.7–4.0)
MCH: 29.4 pg (ref 26.0–34.0)
MCHC: 31.9 g/dL (ref 30.0–36.0)
MCV: 92.3 fL (ref 80.0–100.0)
Monocytes Absolute: 0.3 10*3/uL (ref 0.1–1.0)
Monocytes Relative: 8 %
Neutro Abs: 3.6 10*3/uL (ref 1.7–7.7)
Neutrophils Relative %: 83 %
Platelets: 142 10*3/uL — ABNORMAL LOW (ref 150–400)
RBC: 4.28 MIL/uL (ref 4.22–5.81)
RDW: 15.4 % (ref 11.5–15.5)
WBC: 4.3 10*3/uL (ref 4.0–10.5)
nRBC: 0 % (ref 0.0–0.2)

## 2022-07-23 MED ORDER — HEPARIN SOD (PORK) LOCK FLUSH 100 UNIT/ML IV SOLN
500.0000 [IU] | Freq: Once | INTRAVENOUS | Status: DC | PRN
  Filled 2022-07-23: qty 5

## 2022-07-23 MED ORDER — FAMOTIDINE IN NACL 20-0.9 MG/50ML-% IV SOLN
20.0000 mg | Freq: Once | INTRAVENOUS | Status: AC
  Administered 2022-07-23: 20 mg via INTRAVENOUS
  Filled 2022-07-23: qty 50

## 2022-07-23 MED ORDER — MONTELUKAST SODIUM 10 MG PO TABS
10.0000 mg | ORAL_TABLET | Freq: Once | ORAL | Status: AC
  Administered 2022-07-23: 10 mg via ORAL
  Filled 2022-07-23: qty 1

## 2022-07-23 MED ORDER — SODIUM CHLORIDE 0.9 % IV SOLN
50.0000 mg/m2 | Freq: Once | INTRAVENOUS | Status: AC
  Administered 2022-07-23: 96 mg via INTRAVENOUS
  Filled 2022-07-23: qty 16

## 2022-07-23 MED ORDER — SODIUM CHLORIDE 0.9 % IV SOLN
10.0000 mg | Freq: Once | INTRAVENOUS | Status: AC
  Administered 2022-07-23: 10 mg via INTRAVENOUS
  Filled 2022-07-23: qty 10
  Filled 2022-07-23: qty 1

## 2022-07-23 MED ORDER — SODIUM CHLORIDE 0.9 % IV SOLN
200.0000 mg | Freq: Once | INTRAVENOUS | Status: AC
  Administered 2022-07-23: 200 mg via INTRAVENOUS
  Filled 2022-07-23: qty 20

## 2022-07-23 MED ORDER — PALONOSETRON HCL INJECTION 0.25 MG/5ML
0.2500 mg | Freq: Once | INTRAVENOUS | Status: AC
  Administered 2022-07-23: 0.25 mg via INTRAVENOUS
  Filled 2022-07-23: qty 5

## 2022-07-23 MED ORDER — SODIUM CHLORIDE 0.9 % IV SOLN
Freq: Once | INTRAVENOUS | Status: AC
  Filled 2022-07-23: qty 250

## 2022-07-23 MED ORDER — DIPHENHYDRAMINE HCL 50 MG/ML IJ SOLN
50.0000 mg | Freq: Once | INTRAMUSCULAR | Status: AC
  Administered 2022-07-23: 50 mg via INTRAVENOUS
  Filled 2022-07-23: qty 1

## 2022-07-23 NOTE — Assessment & Plan Note (Signed)
Chemotherapy plan as listed above 

## 2022-07-23 NOTE — Assessment & Plan Note (Signed)
Hemoglobin is decreased as expected while on treatment.  Monitor.

## 2022-07-23 NOTE — Assessment & Plan Note (Signed)
Follow up with nutritionist.  Recommend nutrition supplementation.  

## 2022-07-23 NOTE — Patient Instructions (Signed)
[facility] [place]  Discharge Instructions: Thank you for choosing [facility] to provide your oncology and hematology care.  If you have a lab appointment with the Cancer Center, please go directly to the Cancer Center and check in at the registration area.  Wear comfortable clothing and clothing appropriate for easy access to any Portacath or PICC line.   We strive to give you quality time with your provider. You may need to reschedule your appointment if you arrive late (15 or more minutes).  Arriving late affects you and other patients whose appointments are after yours.  Also, if you miss three or more appointments without notifying the office, you may be dismissed from the clinic at the provider's discretion.      For prescription refill requests, have your pharmacy contact our office and allow 72 hours for refills to be completed.    Today you received the following chemotherapy and/or immunotherapy agents Carboplatin, Taxol      To help prevent nausea and vomiting after your treatment, we encourage you to take your nausea medication as directed.  BELOW ARE SYMPTOMS THAT SHOULD BE REPORTED IMMEDIATELY: *FEVER GREATER THAN 100.4 F (38 C) OR HIGHER *CHILLS OR SWEATING *NAUSEA AND VOMITING THAT IS NOT CONTROLLED WITH YOUR NAUSEA MEDICATION *UNUSUAL SHORTNESS OF BREATH *UNUSUAL BRUISING OR BLEEDING *URINARY PROBLEMS (pain or burning when urinating, or frequent urination) *BOWEL PROBLEMS (unusual diarrhea, constipation, pain near the anus) TENDERNESS IN MOUTH AND THROAT WITH OR WITHOUT PRESENCE OF ULCERS (sore throat, sores in mouth, or a toothache) UNUSUAL RASH, SWELLING OR PAIN  UNUSUAL VAGINAL DISCHARGE OR ITCHING   Items with * indicate a potential emergency and should be followed up as soon as possible or go to the Emergency Department if any problems should occur.  Please show the CHEMOTHERAPY ALERT CARD or IMMUNOTHERAPY ALERT CARD at  check-in to the Emergency Department and triage nurse.  Should you have questions after your visit or need to cancel or reschedule your appointment, please contact [facility]  [phone] and follow the prompts.  Office hours are 8:00 a.m. to 4:30 p.m. Monday - Friday. Please note that voicemails left after 4:00 p.m. may not be returned until the following business day.  We are closed weekends and major holidays. You have access to a nurse at all times for urgent questions. Please call the main number to the clinic [phone] and follow the prompts.  For any non-urgent questions, you may also contact your provider using MyChart. We now offer e-Visits for anyone 18 and older to request care online for non-urgent symptoms. For details visit [email or web address].   Also download the MyChart app! Go to the app store, search "MyChart", open the app, select [facility], and log in with your MyChart username and password.  Masks are optional in the cancer centers. If you would like for your care team to wear a mask while they are taking care of you, please let them know. For doctor visits, patients may have with them one support person who is at least 69 years old. At this time, visitors are not allowed in the infusion area.   

## 2022-07-23 NOTE — Progress Notes (Signed)
Pt in for follow up and treatment.  Reports doing better since being seen in [facility] last week.  Taking prednisone daily currently.  Oxygen at 3 liters.

## 2022-07-23 NOTE — Progress Notes (Signed)
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 Labs reviewed and discussed with patient. Proceed with carboplatin/Taxol Patient is finishing radiation on 07/26/2022. Recommend restaging with PET scan in 6 weeks  Weight loss Follow-up with nutritionist.  Recommend nutrition supplementation.  Dysphagia Symptom has improved. 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  Anemia due to antineoplastic chemotherapy Hemoglobin is decreased as expected while on treatment.  Monitor.   Encounter for antineoplastic chemotherapy Chemotherapy plan as listed above.    Orders Placed This Encounter  Procedures   NM PET Image Restag (PS) Skull Base To Thigh    Standing Status:   Future    Standing Expiration Date:   07/23/2023    Order Specific Question:   If indicated for the ordered procedure, I authorize the administration of a radiopharmaceutical per Radiology protocol    Answer:   Yes    Order Specific Question:   Preferred imaging location?    Answer:   [facility]   CBC with Differential/Platelet    Standing Status:   Future    Standing Expiration Date:   07/24/2023   Comprehensive metabolic panel    Standing Status:   Future    Standing Expiration Date:   07/24/2023    Follow up  Lab NP in 2  weeks Repeat PET scan in 6 weeks and follow-up after.  The patient understands the plans discussed today and is in agreement with them.  He knows to contact our office if he develops concerns prior to his next appointment.   [name], MD      CHIEF COMPLAINT:  Chief complaints: follow up for esophageal carcinoma treatments.  History of prostate cancer.  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 showed partially obstructive likely malignant esophageal tumor was found at [place] junction.  Biopsied.  Normal stomach and normal examined duodenum. Biopsy pathology showed fragments of the squamous mucosa with high-grade dysplastic.  Scant gastric mucosa present with reactive changes and no dysplastic or significant intestinal metaplastic.  Fragments of fibrillar purulent ulcer diabetes also present.  04/27/2022, repeat EGD and a biopsy of GE junction partially obstructed ulcerating mass. Repeat biopsy pathology showed superficial and disaggregated fragments of squamous mucosa with at least severe dysplastic/squamous cell carcinoma in situ.  Definite invasion cannot be identified.  In part secondary to specimen fragmentation and tangential sectioning.    04/27/2022 Imaging   MRI abdomen MRCP with and without contrast showed bilateral benign adrenal adenoma.  Small hepatic hemangioma and small right hepatic parenchymal vascular anomalies. Small hiatal hernia.  Colonic diverticulosis.   05/04/2022 Initial Diagnosis   Primary esophageal squamous cell carcinoma  + Stomach discomfort for 4 months, dysphagia for  both solids and liquids at times. + Weight loss   05/04/2022 Cancer Staging   Staging form: Esophagus - Squamous Cell Carcinoma, AJCC 8th Edition - Clinical: Stage II (cT3, cN0, cM0, L: Lower) - Signed by [name], MD on 05/31/2022 Stage prefix: Initial diagnosis Total positive nodes: 0   05/09/2022 Imaging   PET scan showed intense tracer uptake associated with the distal esophageal mass just above the level of the GE junction compatible with primary esophageal neoplasm. No signs of tracer avid nodal metastasis or solid organ metastasis.   05/20/2022 Procedure   EUS by Dr.Spaete.  T3,Nx 05/20/2022, esophagus mass biopsy showed invasive moderately differentiated squamous cell carcinoma, with basaloid features and minimal keratinization.  Background squamous cell carcinoma in situ.   06/03/2022 -  Chemotherapy   Patient is on Treatment Plan :  ESOPHAGUS Carboplatin/PACLitaxel weekly x 6 weeks with XRT       07/09/2022 - 07/12/2022 Hospital Admission   Hospitalization due to COPD exacerbation.       Patient has multiple comorbidities including COPD/chronic respiratory failure, history of STEMI/combined systolic and diastolic CHF [last LVEF 40 to 45%], lung nodules previously avid on PET scan, biopsy negative and was felt to be calcified granulomas.-Pulmonology Dr. Patsey Berthold Patient lives by himself.  Independent with ADL and some of the iADL  INTERVAL HISTORY:  Nino is here today for repeat clinical assessment. He denies fevers or chills. He denies pain. His appetite is fair His weight is stable. + Esophageal dysphagia, with solids.  He has no difficulty with pured food and liquids.  Dysphagia is improving. + Fatigue and tiredness  Recent hospitalization due to COPD exacerbation and atypical chest pain.  no nausea vomiting diarrhea.   REVIEW OF SYSTEMS:  Review of Systems  Constitutional:  Positive for appetite change and fatigue. Negative for chills, fever and unexpected weight change.   HENT:   Negative for hearing loss and voice change.   Eyes:  Negative for eye problems and icterus.  Respiratory:  Positive for shortness of breath. Negative for chest tightness and cough.        Chronic respiratory failure on oxygen  Cardiovascular:  Negative for leg swelling.  Gastrointestinal:  Positive for constipation. Negative for abdominal distention and abdominal pain.       Dysphagia  Endocrine: Negative for hot flashes.  Genitourinary:  Negative for difficulty urinating, dysuria and frequency.   Musculoskeletal:  Negative for arthralgias.  Skin:  Negative for itching and rash.  Neurological:  Negative for light-headedness and numbness.  Hematological:  Negative for adenopathy. Does not bruise/bleed easily.  Psychiatric/Behavioral:  Negative for confusion.      VITALS:  Blood pressure 128/73, pulse 93, temperature 97.6 F (36.4 C), temperature source Tympanic, resp. rate 16, weight 178 lb 8 oz (81 kg), SpO2 98 %.  Wt Readings from Last 3 Encounters:  07/23/22 178 lb 8 oz (81 kg)  07/20/22 180 lb (81.6 kg)  07/16/22 172 lb (78 kg)    Body mass index is 25.61 kg/m.  Performance status (ECOG): 2 - Symptomatic, <50% confined to bed  PHYSICAL EXAM:  Physical Exam Constitutional:      General: He is not in acute distress.    Appearance: He is obese. He is not diaphoretic.  HENT:  Head: Normocephalic and atraumatic.     Nose: Nose normal.     Mouth/Throat:     Pharynx: No oropharyngeal exudate.  Eyes:     General: No scleral icterus.    Pupils: Pupils are equal, round, and reactive to light.  Cardiovascular:     Rate and Rhythm: Normal rate.     Heart sounds: No murmur heard. Pulmonary:     Effort: Pulmonary effort is normal. No respiratory distress.     Comments: Patient is on nasal cannula oxygen Abdominal:     General: There is no distension.     Palpations: Abdomen is soft.     Tenderness: There is no abdominal tenderness.  Musculoskeletal:         General: Normal range of motion.     Cervical back: Normal range of motion and neck supple.  Skin:    General: Skin is warm and dry.     Findings: No erythema.  Neurological:     Mental Status: He is alert and oriented to person, place, and time.     Cranial Nerves: No cranial nerve deficit.     Motor: No abnormal muscle tone.     Coordination: Coordination normal.  Psychiatric:        Mood and Affect: Mood and affect normal.      LABS:      Latest Ref Rng & Units 07/23/2022    9:03 AM 07/16/2022    9:07 AM 07/10/2022    6:30 AM  CBC  WBC 4.0 - 10.5 K/uL 4.3  7.0  4.5   Hemoglobin 13.0 - 17.0 g/dL 12.6  13.3  12.4   Hematocrit 39.0 - 52.0 % 39.5  41.6  38.9   Platelets 150 - 400 K/uL 142  144  160       Latest Ref Rng & Units 07/23/2022    9:03 AM 07/16/2022    9:07 AM 07/12/2022    5:52 AM  CMP  Glucose 70 - 99 mg/dL 100  128    BUN 8 - 23 mg/dL 16  17    Creatinine 0.61 - 1.24 mg/dL 0.84  0.93    Sodium 135 - 145 mmol/L 141  141    Potassium 3.5 - 5.1 mmol/L 3.5  3.8  4.3   Chloride 98 - 111 mmol/L 100  99    CO2 22 - 32 mmol/L 33  34    Calcium 8.9 - 10.3 mg/dL 9.2  9.6    Total Protein 6.5 - 8.1 g/dL 6.9  7.0    Total Bilirubin 0.3 - 1.2 mg/dL 0.2  0.7    Alkaline Phos 38 - 126 U/L 60  69    AST 15 - 41 U/L 20  17    ALT 0 - 44 U/L 25  22       STUDIES:  DG Chest 2 View  Result Date: 07/21/2022 CLINICAL DATA:  Left anterior chest pain, no reported injury COPD EXAM: CHEST - 2 VIEW; LEFT RIBS - 2 VIEW COMPARISON:  07/09/2022 FINDINGS: The heart size and mediastinal contours are within normal limits. Pulmonary hyperinflation and emphysema. No acute airspace opacity. The visualized skeletal structures are unremarkable. No displaced fracture or other radiographic abnormality of the left ribs. IMPRESSION: 1. Pulmonary hyperinflation and emphysema. No acute airspace opacity. 2. No displaced fracture or other radiographic abnormality of the left ribs. Electronically Signed    By: Delanna Ahmadi M.D.   On: 07/21/2022 13:47   DG Ribs  Unilateral Left  Result Date: 07/21/2022 CLINICAL DATA:  Left anterior chest pain, no reported injury COPD EXAM: CHEST - 2 VIEW; LEFT RIBS - 2 VIEW COMPARISON:  07/09/2022 FINDINGS: The heart size and mediastinal contours are within normal limits. Pulmonary hyperinflation and emphysema. No acute airspace opacity. The visualized skeletal structures are unremarkable. No displaced fracture or other radiographic abnormality of the left ribs. IMPRESSION: 1. Pulmonary hyperinflation and emphysema. No acute airspace opacity. 2. No displaced fracture or other radiographic abnormality of the left ribs. Electronically Signed   By: Delanna Ahmadi M.D.   On: 07/21/2022 13:47   DG Chest Port 1 View  Result Date: 07/09/2022 CLINICAL DATA:  Chest pain, shortness of breath over the past week EXAM: PORTABLE CHEST 1 VIEW COMPARISON:  03/15/2022, 07/09/2022 FINDINGS: 7 mm right upper lobe pulmonary nodule. Faintly visualized 9 mm left upper lobe pulmonary nodule. Bilateral upper lobe pulmonary nodules were better characterized on the CT of the chest dated 07/09/2022 performed earlier same day. Bilateral mild interstitial thickening. Bilateral emphysematous changes. No focal consolidation. No pleural effusion or pneumothorax. Heart and mediastinal contours are unremarkable. No acute osseous abnormality. IMPRESSION: 1. No acute cardiopulmonary disease. Electronically Signed   By: Kathreen Devoid M.D.   On: 07/09/2022 10:40   CT Angio Chest PE W and/or Wo Contrast  Result Date: 07/09/2022 CLINICAL DATA:  Pulmonary embolism high probability, shortness of breath and chest pain. History of esophageal neoplasm. * Tracking Code: BO * EXAM: CT ANGIOGRAPHY CHEST WITH CONTRAST TECHNIQUE: Multidetector CT imaging of the chest was performed using the standard protocol during bolus administration of intravenous contrast. Multiplanar CT image reconstructions and MIPs were obtained to  evaluate the vascular anatomy. RADIATION DOSE REDUCTION: This exam was performed according to the departmental dose-optimization program which includes automated exposure control, adjustment of the mA and/or kV according to patient size and/or use of iterative reconstruction technique. CONTRAST:  64m OMNIPAQUE IOHEXOL 350 MG/ML SOLN COMPARISON:  PET-CT May 07, 2022. FINDINGS: Cardiovascular: Satisfactory opacification of the pulmonary arteries to the segmental level. No evidence of pulmonary embolism. Aortic atherosclerosis. Normal heart size. No pericardial effusion. Mediastinum/Nodes: No suspicious thyroid nodule. No pathologically enlarged mediastinal, hilar or axillary lymph nodes. Stable calcified 15 mm nodule in the right axilla. Marked distal esophageal wall thickening with patulous gas and fluid filled proximal esophagus similar prior PET-CT. Lungs/Pleura: Stable 9 mm left upper lobe pulmonary nodule which was not hypermetabolic on prior PET-CT. No new suspicious pulmonary nodules or masses. Bilateral calcified granulomata. Stable upper lobe predominant paraseptal and centrilobular emphysematous change with multifocal scarring including a bandlike area in the right upper lobe. Upper Abdomen: No acute abnormality.  Calcified splenic granulomata. Musculoskeletal: No aggressive lytic or blastic lesion of bone. Multilevel degenerative changes spine. Review of the MIP images confirms the above findings. IMPRESSION: 1. No evidence of pulmonary embolus. 2. Similar appearance of the masslike distal esophageal wall thickening consistent with patient's known esophageal carcinoma. 3. Similar patulous appearance of the proximal esophagus with fluid and gas filling the esophagus, almost certainly reflecting a measure of distal esophageal obstruction, quantification of which can not be determined on this examination. 4. No evidence of metastatic disease in the chest. 5. Stable emphysematous change and multifocal  pulmonary scarring. 6. Aortic Atherosclerosis (ICD10-I70.0) and Emphysema (ICD10-J43.9). Electronically Signed   By: JDahlia BailiffM.D.   On: 07/09/2022 10:22   NM PET Image Initial (PI) Skull Base To Thigh (F-18 FDG)  Result Date: 05/09/2022 CLINICAL DATA:  Subsequent treatment strategy  for esophageal neoplasm. EXAM: NUCLEAR MEDICINE PET SKULL BASE TO THIGH TECHNIQUE: 10.24 mCi F-18 FDG was injected intravenously. Full-ring PET imaging was performed from the skull base to thigh after the radiotracer. CT data was obtained and used for attenuation correction and anatomic localization. Fasting blood glucose: 107 mg/dl COMPARISON:  PET-CT 10/13/2017. CT chest 01/21/2021. CT AP 12/21/2021 and MRI abdomen 04/26/2022 FINDINGS: Mediastinal blood pool activity: SUV max 2.24 Liver activity: SUV max NA NECK: No hypermetabolic lymph nodes in the neck. Incidental CT findings: None CHEST: No tracer avid axillary, supraclavicular, mediastinal, or hilar lymph nodes. Mass involving the distal esophagus measures 5.1 x 3.1 by 4.8 cm and has an SUV max of 12.82, image 133/2. The more proximal portions of the esophagus appear mildly dilated and patulous. Marked narrowing of the esophageal lumen is noted at the level of the tumor. Incidental CT findings: No tracer avid lung nodules identified at this time. Partially calcified nodule within the left upper lobe is again noted and is stable measuring 9 mm, image 84/2. Stable bandlike area of scarring within the anterior right upper lobe. Aortic atherosclerosis and coronary artery calcifications. Moderate emphysema. ABDOMEN/PELVIS: No abnormal tracer uptake identified within the liver, pancreas, spleen, or adrenal glands. No tracer avid abdominopelvic lymph nodes. Incidental CT findings: Bilateral adrenal nodules are again noted, previously characterized as benign adenomas. No follow-up recommended. Penile prosthesis is identified. Bilateral fat containing inguinal hernias, right  greater than left. SKELETON: No focal hypermetabolic activity to suggest skeletal metastasis. Incidental CT findings: none IMPRESSION: 1. There is intense tracer uptake associated with the distal esophageal mass just above the level of the GE junction compatible with primary esophageal neoplasm. 2. No signs of tracer avid nodal metastasis or solid organ metastasis. 3. Aortic Atherosclerosis (ICD10-I70.0) and Emphysema (ICD10-J43.9). 4. Coronary artery calcifications. Electronically Signed   By: Kerby Moors M.D.   On: 05/09/2022 16:41   MR ABDOMEN MRCP W WO CONTAST  Result Date: 04/27/2022 CLINICAL DATA:  Abnormal findings on CT, adrenal nodules EXAM: MRI ABDOMEN WITHOUT AND WITH CONTRAST (INCLUDING MRCP) TECHNIQUE: Multiplanar multisequence MR imaging of the abdomen was performed both before and after the administration of intravenous contrast. Heavily T2-weighted images of the biliary and pancreatic ducts were obtained, and three-dimensional MRCP images were rendered by post processing. CONTRAST:  73m GADAVIST GADOBUTROL 1 MMOL/ML IV SOLN COMPARISON:  CT abdomen and pelvis 12/21/2021 FINDINGS: Lower chest: No acute findings. Hepatobiliary: Liver is normal in size and contour. There is a 13 mm hyperintense T2 signal lesion in the central right hepatic lobe which demonstrates early discontinuous and gradually increasing postcontrast enhancement, also stable in size since previous ultrasound consistent with a hemangioma. There are a few additional foci of early arterial parenchymal enhancement in the liver which measure up to 16 mm peripherally in segment 6, with no corresponding signal abnormalities on other sequences likely representing vascular anomalies. Gallbladder appears normal. Common bile duct is upper normal caliber measuring 7 mm with no intraluminal filling defects visualized. Pancreas: Mildly atrophic with no suspicious mass or ductal dilatation identified. Spleen:  Within normal limits in size and  appearance. Adrenals/Urinary Tract: Bilateral adrenal nodules which demonstrate signal dropout on out of phase T1 consistent with adenomas measuring 0.9 cm on the right and 2.4 cm on the left. Kidneys appear within normal limits. Stomach/Bowel: Small hiatal hernia. No evidence of bowel obstruction. Colonic diverticulosis. Vascular/Lymphatic: No pathologically enlarged lymph nodes identified. No abdominal aortic aneurysm demonstrated. Other:  No ascites. Musculoskeletal: No suspicious bone lesions  identified. IMPRESSION: 1. Bilateral benign adrenal adenomas. 2. Small hepatic hemangioma and small likely hepatic parenchymal vascular anomalies. 3. Small hiatal hernia.  Colonic diverticulosis. Electronically Signed   By: Ofilia Neas M.D.   On: 04/27/2022 08:43      HISTORY:   Past Medical History:  Diagnosis Date   Allergic rhinitis    Anxiety    Back pain    Cancer (HCC)    prostate   CHF (congestive heart failure) ([place])    Colon polyps 08/2012   colonoscopy; multiple colon polyps; repeat colonoscopy in one year.   Complication of anesthesia    COPD (chronic obstructive pulmonary disease) ([place])    a. on home O2.   Coronary artery disease 11/2009   a. late presenting ant MI; b. LHC 100% pLAD s/p PCI/DES, 99% mRCA s/p PCI/DES, EF 35%; c. nuclear stress test 05/13: prior ant/inf infarcts w/o ischemia, EF 43%; d. LHC 02/14: widely patent stents with no other obs dz, EF 40%; e. LHC 11/17: LM nl, mLAD 10%, patent LAD stent, dLAD 20%, p-mRCA 10%, mRCA 40%, patent RCA stent, EF 35-45%    Depression    Dysphagia    Family history of adverse reaction to anesthesia    GERD (gastroesophageal reflux disease)    WLSLHTDS(287.6)    Helicobacter pylori (H. pylori)    Hemorrhoids    Hernia    inguinal   HFrEF (heart failure with reduced ejection fraction) ([place])    a. 10/2016 LV gram: EF 35-45%; b. 06/2018 Echo: EF 40-45%. c. 01/2021 Echo: EF 40-45%   Hyperlipidemia    Hypertension    Insomnia     Ischemic cardiomyopathy    a. 10/2016 LV gram: EF 35-45%; b. 06/2018 Echo: EF 40-45%.   Myalgia    Oxygen dependent    Peptic ulcer    Pulmonary nodules    ST elevation (STEMI) myocardial infarction involving left anterior descending coronary artery ([place]) 11/2009   a. s/p PCI to the LAD   Status post dilation of esophageal narrowing 2000   Thickening of esophagus    Tinea pedis    Wears dentures    partial upper    Past Surgical History:  Procedure Laterality Date    coronary stents     Admission  12/20/2012   COPD exacerbation.  [place].   CARDIAC CATHETERIZATION     CARDIAC CATHETERIZATION  02/05/2013   [facility]   CARDIAC CATHETERIZATION  09/19/2013   [facility] : patent stents with no change in anatomy. EF: 40$   CARDIAC CATHETERIZATION Left 11/12/2016   Procedure: Left Heart Cath and Coronary Angiography;  Surgeon: Wellington Hampshire, MD;  Location: [facility];  Service: Cardiovascular;  Laterality: Left;   COLONOSCOPY     COLONOSCOPY WITH PROPOFOL N/A 05/18/2016   Procedure: COLONOSCOPY WITH PROPOFOL;  Surgeon: Lucilla Lame, MD;  Location: [facility] ENDOSCOPY;  Service: Endoscopy;  Laterality: N/A;   COLONOSCOPY WITH PROPOFOL N/A 03/19/2020   Procedure: COLONOSCOPY WITH PROPOFOL;  Surgeon: Lin Landsman, MD;  Location: [facility] ENDOSCOPY;  Service: Gastroenterology;  Laterality: N/A;   CORONARY ANGIOPLASTY WITH STENT PLACEMENT  09/19/2009   LAD 3.0 X23 mm Xience DES, RCA: 4.0 X 15 mm Xience DES   ELECTROMAGNETIC NAVIGATION BROCHOSCOPY N/A 10/27/2017   Procedure: ELECTROMAGNETIC NAVIGATION BRONCHOSCOPY;  Surgeon: Flora Lipps, MD;  Location: [facility] ORS;  Service: Cardiopulmonary;  Laterality: N/A;   ESOPHAGEAL DILATION     ESOPHAGOGASTRODUODENOSCOPY  12/20/2006   ESOPHAGOGASTRODUODENOSCOPY  08/20/2012   ESOPHAGOGASTRODUODENOSCOPY (EGD) WITH  PROPOFOL N/A 04/19/2022   Procedure: ESOPHAGOGASTRODUODENOSCOPY (EGD) WITH PROPOFOL;  Surgeon: [name], MD;  Location: [facility] ENDOSCOPY;   Service: Endoscopy;  Laterality: N/A;   ESOPHAGOGASTRODUODENOSCOPY (EGD) WITH PROPOFOL N/A 04/27/2022   Procedure: ESOPHAGOGASTRODUODENOSCOPY (EGD) WITH PROPOFOL;  Surgeon: [name], MD;  Location: [facility] ENDOSCOPY;  Service: Endoscopy;  Laterality: N/A;   EUS N/A 05/20/2022   Procedure: UPPER ENDOSCOPIC ULTRASOUND (EUS) LINEAR;  Surgeon: Reita Cliche, MD;  Location: [facility] ENDOSCOPY;  Service: Gastroenterology;  Laterality: N/A;  [facility]   HERNIA REPAIR  07/20/2012   L inguinal hernia repair   PENILE PROSTHESIS IMPLANT      Family History  Problem Relation Age of Onset   Heart attack Brother        Brother #1   Diabetes Brother    Hypertension Brother        #3   Coronary artery disease Father 30       deceased   Heart attack Father    Diabetes Father    Heart disease Father    COPD Mother 80       deceased   Alcohol abuse Sister        polysubstance abuse   COPD Sister    Lung cancer Sister    Alcohol abuse Sister        polysubstance abuse   Penile cancer Brother    Diabetes Brother    Prostate cancer Neg Hx    Bladder Cancer Neg Hx    Kidney cancer Neg Hx     Social History:  reports that he quit smoking about 3 years ago. His smoking use included cigarettes. He has a 126.00 pack-year smoking history. He has never used smokeless tobacco. He reports that he does not drink alcohol and does not use drugs.  Allergies:  Allergies  Allergen Reactions   Prozac [Fluoxetine Hcl] Shortness Of Breath   Hydroxyzine    Bupropion Other (See Comments)    "Makes me feel funny" Other reaction(s): Other (See Comments) Unknown/"made me feel real funny" "Makes me feel funny"   Effexor Xr [Venlafaxine Hcl Er] Other (See Comments)    "Makes me feel funny"    Current Medications: Current Outpatient Medications  Medication Sig Dispense Refill   albuterol (VENTOLIN HFA) 108 (90 Base) MCG/ACT inhaler Inhale 2 puffs into the lungs every 6 (six) hours as needed for wheezing  or shortness of breath. 18 g 4   aspirin 81 MG tablet Take 81 mg by mouth daily.     budesonide (PULMICORT) 0.5 MG/2ML nebulizer solution Take 2 mLs (0.5 mg total) by nebulization in the morning and at bedtime. 320 mL 12   docusate sodium (COLACE) 100 MG capsule Take 1 capsule (100 mg total) by mouth 2 (two) times daily. 60 capsule 0   esomeprazole (NEXIUM) 40 MG capsule TAKE ONE CAPSULE BY MOUTH ONCE DAILY 30 capsule 4   fluticasone (FLONASE) 50 MCG/ACT nasal spray Place 2 sprays into both nostrils daily.     furosemide (LASIX) 40 MG tablet TAKE ONE TABLET BY MOUTH ONCE DAILY 90 tablet 2   ipratropium-albuterol (DUONEB) 0.5-2.5 (3) MG/3ML SOLN Take 3 mLs by nebulization 2 (two) times daily.     lidocaine-prilocaine (EMLA) cream Apply to affected area once 30 g 3   loratadine (CLARITIN) 10 MG tablet Take 10 mg by mouth daily.     LORazepam (ATIVAN) 1 MG tablet Take 1 tablet (1 mg total) by mouth every 4 (four) hours while awake. (  Patient taking differently: Take 1 mg by mouth 3 (three) times daily.) 30 tablet 0   losartan (COZAAR) 25 MG tablet Take 25 mg by mouth daily.     mirtazapine (REMERON) 45 MG tablet Take 45 mg by mouth at bedtime.     Multiple Vitamin (MULTIVITAMIN) capsule Take 1 capsule by mouth daily.     OXYGEN Inhale 2.5 L into the lungs daily.     polyethylene glycol (MIRALAX) 17 g packet Take 17 g by mouth daily as needed.     predniSONE (DELTASONE) 10 MG tablet Take 1 tablet (10 mg total) by mouth daily with breakfast. 10 tablet 0   Spacer/Aero-Holding Chambers (AEROCHAMBER MV) inhaler Use as instructed 1 each 2   sucralfate (CARAFATE) 1 g tablet Take 1 g by mouth 2 (two) times daily with a meal.     atorvastatin (LIPITOR) 80 MG tablet Take 1 tablet (80 mg total) by mouth daily. 90 tablet 3   nitroGLYCERIN (NITROSTAT) 0.4 MG SL tablet Place 1 tablet (0.4 mg total) under the tongue every 5 (five) minutes as needed. (Patient not taking: Reported on 07/20/2022) 25 tablet 3    prochlorperazine (COMPAZINE) 10 MG tablet Take 1 tablet (10 mg total) by mouth every 6 (six) hours as needed (Nausea or vomiting). (Patient not taking: Reported on 07/20/2022) 30 tablet 1   No current facility-administered medications for this visit.   Facility-Administered Medications Ordered in Other Visits  Medication Dose Route Frequency Provider Last Rate Last Admin   heparin lock flush 100 unit/mL  500 Units Intracatheter Once PRN [name], MD

## 2022-07-23 NOTE — Assessment & Plan Note (Signed)
He is on concurrent chemotherapy carboplatin/Taxol weekly with radiation. Labs reviewed and discussed with patient. Proceed with carboplatin/Taxol Patient is finishing radiation on 07/26/2022. Recommend restaging with PET scan in 6 weeks

## 2022-07-23 NOTE — Progress Notes (Signed)
Nutrition Follow-up:  Patient with esophageal cancer.  Patient receiving concurrent chemotherapy and radiation.    Met with patient during infusion.  Patient reports that foods are going down easier.  Ate chipped beef and gravy with eggs last night and 2 grilled cheese sandwiches during the day.  Ate a pack of nabs in infusion.  Drinking 2 of 500 calorie shakes a day and 2 of the 350 calorie shakes.     Medications: reviewed  Labs: reviewed  Anthropometrics:   Weight 178 lb 8 oz today  172 lb on 7/28 177 lb on 7/14 178 lb 8 oz on 6/14 200 lb on 3/12   NUTRITION DIAGNOSIS: Inadequate oral intake stable    INTERVENTION:  Continue at least 2, 500 calorie shake and 2, 350 calorie shake daily Reviewed chopping, grinding, pureeing foods for ease of swallowing      MONITORING, EVALUATION, GOAL: weight trends, intake   NEXT VISIT: phone call Thursday, August 31  Latica Hohmann B. Zenia Resides, [place], [place] Registered Dietitian [phone]

## 2022-07-23 NOTE — Assessment & Plan Note (Signed)
Symptom has improved. 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

## 2022-07-23 NOTE — Assessment & Plan Note (Signed)
Likely musculoskeletal-trials of prednisone Patient has cardiology appointment next week.

## 2022-07-24 ENCOUNTER — Other Ambulatory Visit: Payer: Self-pay

## 2022-07-26 ENCOUNTER — Ambulatory Visit
Admission: RE | Admit: 2022-07-26 | Discharge: 2022-07-26 | Disposition: A | Discharge status: Home / Self Care | Payer: Medicare Other | Source: Ambulatory Visit | Attending: Radiation Oncology | Admitting: Radiation Oncology

## 2022-07-26 ENCOUNTER — Other Ambulatory Visit: Payer: Self-pay

## 2022-07-26 ENCOUNTER — Ambulatory Visit: Payer: Medicare Other

## 2022-07-26 DIAGNOSIS — Z510 Encounter for antineoplastic radiation therapy: Secondary | ICD-10-CM | POA: Diagnosis not present

## 2022-07-26 LAB — RAD ONC ARIA SESSION SUMMARY
Course Elapsed Days: 42
Plan Fractions Treated to Date: 28
Plan Prescribed Dose Per Fraction: 1.8 Gy
Plan Total Fractions Prescribed: 30
Plan Total Prescribed Dose: 54 Gy
Reference Point Dosage Given to Date: 50.4 Gy
Reference Point Session Dosage Given: 1.8 Gy
Session Number: 28

## 2022-07-27 ENCOUNTER — Other Ambulatory Visit: Payer: Self-pay

## 2022-07-27 ENCOUNTER — Ambulatory Visit
Admission: RE | Admit: 2022-07-27 | Discharge: 2022-07-27 | Disposition: A | Discharge status: Home / Self Care | Payer: Medicare Other | Source: Ambulatory Visit | Attending: Radiation Oncology | Admitting: Radiation Oncology

## 2022-07-27 ENCOUNTER — Encounter: Payer: Self-pay | Admitting: *Deleted

## 2022-07-27 DIAGNOSIS — Z510 Encounter for antineoplastic radiation therapy: Secondary | ICD-10-CM | POA: Diagnosis not present

## 2022-07-27 LAB — RAD ONC ARIA SESSION SUMMARY
Course Elapsed Days: 43
Plan Fractions Treated to Date: 29
Plan Prescribed Dose Per Fraction: 1.8 Gy
Plan Total Fractions Prescribed: 30
Plan Total Prescribed Dose: 54 Gy
Reference Point Dosage Given to Date: 52.2 Gy
Reference Point Session Dosage Given: 1.8 Gy
Session Number: 29
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
## 2022-07-28 ENCOUNTER — Other Ambulatory Visit: Payer: Self-pay

## 2022-07-28 ENCOUNTER — Ambulatory Visit
Admission: RE | Admit: 2022-07-28 | Discharge: 2022-07-28 | Disposition: A | Discharge status: Home / Self Care | Payer: Medicare Other | Source: Ambulatory Visit | Attending: Radiation Oncology | Admitting: Radiation Oncology

## 2022-07-28 ENCOUNTER — Ambulatory Visit (INDEPENDENT_AMBULATORY_CARE_PROVIDER_SITE_OTHER): Payer: Medicare Other | Admitting: Medical

## 2022-07-28 ENCOUNTER — Encounter: Payer: Self-pay | Admitting: Medical

## 2022-07-28 VITALS — BP 122/60 | HR 98 | Ht 70.0 in | Wt 180.1 lb

## 2022-07-28 DIAGNOSIS — I25118 Atherosclerotic heart disease of native coronary artery with other forms of angina pectoris: Secondary | ICD-10-CM

## 2022-07-28 DIAGNOSIS — R079 Chest pain, unspecified: Secondary | ICD-10-CM | POA: Diagnosis not present

## 2022-07-28 DIAGNOSIS — I493 Ventricular premature depolarization: Secondary | ICD-10-CM

## 2022-07-28 DIAGNOSIS — E782 Mixed hyperlipidemia: Secondary | ICD-10-CM

## 2022-07-28 DIAGNOSIS — J449 Chronic obstructive pulmonary disease, unspecified: Secondary | ICD-10-CM

## 2022-07-28 DIAGNOSIS — Z510 Encounter for antineoplastic radiation therapy: Secondary | ICD-10-CM | POA: Diagnosis not present

## 2022-07-28 DIAGNOSIS — R072 Precordial pain: Secondary | ICD-10-CM | POA: Diagnosis not present

## 2022-07-28 LAB — RAD ONC ARIA SESSION SUMMARY
Course Elapsed Days: 44
Plan Fractions Treated to Date: 30
Plan Prescribed Dose Per Fraction: 1.8 Gy
Plan Total Fractions Prescribed: 30
Plan Total Prescribed Dose: 54 Gy
Reference Point Dosage Given to Date: 54 Gy
Reference Point Session Dosage Given: 1.8 Gy
Session Number: 30

## 2022-07-28 MED ORDER — NITROGLYCERIN 0.4 MG SL SUBL: 0.4000 mg | SUBLINGUAL_TABLET | SUBLINGUAL | 3 refills | Status: AC | PRN

## 2022-07-28 NOTE — Patient Instructions (Addendum)
Medication Instructions:   Your physician recommends that you continue on your current medications as directed. Please refer to the Current Medication list given to you today.  *If you need a refill on your cardiac medications before your next appointment, please call your pharmacy*   Lab Work: None ordered  If you have labs (blood work) drawn today and your tests are completely normal, you will receive your results only by: [place] (if you have MyChart) OR A paper copy in the mail If you have any lab test that is abnormal or we need to change your treatment, we will call you to review the results.   Testing/Procedures: [place]  Your caregiver has ordered a Stress Test with nuclear imaging. The purpose of this test is to evaluate the blood supply to your heart muscle. This procedure is referred to as a "Non-Invasive Stress Test." This is because other than having an IV started in your vein, nothing is inserted or "invades" your body. Cardiac stress tests are done to find areas of poor blood flow to the heart by determining the extent of coronary artery disease (CAD). Some patients exercise on a treadmill, which naturally increases the blood flow to your heart, while others who are  unable to walk on a treadmill due to physical limitations have a pharmacologic/chemical stress agent called Lexiscan . This medicine will mimic walking on a treadmill by temporarily increasing your coronary blood flow.   Please note: these test may take anywhere between 2-4 hours to complete  PLEASE REPORT TO [place] AT THE FIRST DESK WILL DIRECT YOU WHERE TO GO  Date of Procedure:_____________________________________  Arrival Time for Procedure:______________________________  Instructions regarding medication:   __X__:  Hold other medications as follows:_PLEASE HOLD LASIX THE MORNING OF THE PROCEDURE.    PLEASE NOTIFY THE OFFICE AT LEAST 24 HOURS IN  ADVANCE IF YOU ARE UNABLE TO KEEP YOUR APPOINTMENT.  [phone] AND  PLEASE NOTIFY NUCLEAR MEDICINE AT [facility] AT LEAST 24 HOURS IN ADVANCE IF YOU ARE UNABLE TO KEEP YOUR APPOINTMENT. [phone]  How to prepare for your Myoview test:  Do not eat or drink after midnight No caffeine for 24 hours prior to test No smoking 24 hours prior to test. Your medication may be taken with water.  If your doctor stopped a medication because of this test, do not take that medication. Ladies, please do not wear dresses.  Skirts or pants are appropriate. Please wear a short sleeve shirt. No perfume, cologne or lotion.     Follow-Up: At [facility], you and your health needs are our priority.  As part of our continuing mission to provide you with exceptional heart care, we have created designated Provider Care Teams.  These Care Teams include your primary Cardiologist (physician) and Advanced Practice Providers (APPs -  Physician Assistants and Nurse Practitioners) who all work together to provide you with the care you need, when you need it.  We recommend signing up for the patient portal called "MyChart".  Sign up information is provided on this After Visit Summary.  MyChart is used to connect with patients for Virtual Visits (Telemedicine).  Patients are able to view lab/test results, encounter notes, upcoming appointments, etc.  Non-urgent messages can be sent to your provider as well.   To learn more about what you can do with MyChart, go to [email or web address].    Your next appointment:   1 month(s)  The format for your next appointment:  In Person  Provider:   You may see Kathlyn Sacramento, MD or one of the following Advanced Practice Providers on your designated Care Team:   Murray Hodgkins, NP Christell Faith, PA-C Cadence Kathlen Mody, [place]    Other Instructions N/A  Important Information About Sugar

## 2022-08-02 ENCOUNTER — Inpatient Hospital Stay: Payer: Medicare Other

## 2022-08-02 ENCOUNTER — Inpatient Hospital Stay (HOSPITAL_BASED_OUTPATIENT_CLINIC_OR_DEPARTMENT_OTHER): Payer: Medicare Other | Admitting: Nurse Practitioner

## 2022-08-02 VITALS — BP 120/68 | HR 91 | Temp 98.1°F | Resp 18 | Wt 178.0 lb

## 2022-08-02 DIAGNOSIS — Z9981 Dependence on supplemental oxygen: Secondary | ICD-10-CM | POA: Insufficient documentation

## 2022-08-02 DIAGNOSIS — J449 Chronic obstructive pulmonary disease, unspecified: Secondary | ICD-10-CM | POA: Diagnosis not present

## 2022-08-02 DIAGNOSIS — C159 Malignant neoplasm of esophagus, unspecified: Secondary | ICD-10-CM

## 2022-08-02 DIAGNOSIS — Z79899 Other long term (current) drug therapy: Secondary | ICD-10-CM | POA: Insufficient documentation

## 2022-08-02 DIAGNOSIS — R0789 Other chest pain: Secondary | ICD-10-CM | POA: Insufficient documentation

## 2022-08-02 DIAGNOSIS — Z8546 Personal history of malignant neoplasm of prostate: Secondary | ICD-10-CM | POA: Insufficient documentation

## 2022-08-02 DIAGNOSIS — Z5111 Encounter for antineoplastic chemotherapy: Secondary | ICD-10-CM | POA: Insufficient documentation

## 2022-08-02 DIAGNOSIS — R0602 Shortness of breath: Secondary | ICD-10-CM | POA: Diagnosis not present

## 2022-08-02 DIAGNOSIS — C155 Malignant neoplasm of lower third of esophagus: Secondary | ICD-10-CM | POA: Insufficient documentation

## 2022-08-02 DIAGNOSIS — Z87891 Personal history of nicotine dependence: Secondary | ICD-10-CM | POA: Diagnosis not present

## 2022-08-02 LAB — COMPREHENSIVE METABOLIC PANEL
ALT: 24 U/L (ref 0–44)
AST: 20 U/L (ref 15–41)
Albumin: 3.8 g/dL (ref 3.5–5.0)
Alkaline Phosphatase: 57 U/L (ref 38–126)
Anion gap: 10 (ref 5–15)
BUN: 17 mg/dL (ref 8–23)
CO2: 32 mmol/L (ref 22–32)
Calcium: 8.9 mg/dL (ref 8.9–10.3)
Chloride: 95 mmol/L — ABNORMAL LOW (ref 98–111)
Creatinine, Ser: 0.84 mg/dL (ref 0.61–1.24)
GFR, Estimated: >60 mL/min (ref >60)
Glucose, Bld: 129 mg/dL — ABNORMAL HIGH (ref 70–99)
Potassium: 3.7 mmol/L (ref 3.5–5.1)
Sodium: 137 mmol/L (ref 135–145)
Total Bilirubin: 0.2 mg/dL — ABNORMAL LOW (ref 0.3–1.2)
Total Protein: 6.6 g/dL (ref 6.5–8.1)

## 2022-08-02 LAB — CBC WITH DIFFERENTIAL/PLATELET
Abs Immature Granulocytes: 0.05 10*3/uL (ref 0.00–0.07)
Basophils Absolute: 0 10*3/uL (ref 0.0–0.1)
Basophils Relative: 1 %
Eosinophils Absolute: 0 10*3/uL (ref 0.0–0.5)
Eosinophils Relative: 1 %
HCT: 39 % (ref 39.0–52.0)
Hemoglobin: 12.4 g/dL — ABNORMAL LOW (ref 13.0–17.0)
Immature Granulocytes: 2 %
Lymphocytes Relative: 11 %
Lymphs Abs: 0.3 10*3/uL — ABNORMAL LOW (ref 0.7–4.0)
MCH: 29.6 pg (ref 26.0–34.0)
MCHC: 31.8 g/dL (ref 30.0–36.0)
MCV: 93.1 fL (ref 80.0–100.0)
Monocytes Absolute: 0.7 10*3/uL (ref 0.1–1.0)
Monocytes Relative: 24 %
Neutro Abs: 1.7 10*3/uL (ref 1.7–7.7)
Neutrophils Relative %: 61 %
Platelets: 164 10*3/uL (ref 150–400)
RBC: 4.19 MIL/uL — ABNORMAL LOW (ref 4.22–5.81)
RDW: 17 % — ABNORMAL HIGH (ref 11.5–15.5)
WBC: 2.8 10*3/uL — ABNORMAL LOW (ref 4.0–10.5)
nRBC: 0 % (ref 0.0–0.2)

## 2022-08-02 NOTE — Progress Notes (Signed)
Returns for follow-up. Pt reports that he has completed radiation. Still has some fatigue. He has started [facility] PT/OT.

## 2022-08-02 NOTE — Progress Notes (Signed)
Hematology/Oncology Progress Note [phone] [phone]   Clinic Day:  08/02/2022   Referring physician: [name], MD  Chief complaints: follow up for esophageal carcinoma treatments.  History of prostate cancer.  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 showed partially obstructive likely malignant esophageal tumor was found at [place] junction.  Biopsied.  Normal stomach and normal examined duodenum. Biopsy pathology showed fragments of the squamous mucosa with high-grade dysplastic.  Scant gastric mucosa present with reactive changes and no dysplastic or significant intestinal metaplastic.  Fragments of fibrillar purulent ulcer diabetes also present.  04/27/2022, repeat EGD and a biopsy of GE junction partially obstructed ulcerating mass. Repeat biopsy pathology showed superficial and disaggregated fragments of squamous mucosa with at least severe dysplastic/squamous cell carcinoma in situ.  Definite invasion cannot be identified.  In part secondary to specimen fragmentation and tangential sectioning.    04/27/2022 Imaging   MRI abdomen MRCP with and without contrast showed bilateral benign adrenal adenoma.  Small hepatic hemangioma and small right hepatic parenchymal vascular anomalies. Small hiatal hernia.  Colonic diverticulosis.   05/04/2022 Initial Diagnosis   Primary esophageal squamous cell carcinoma  + Stomach discomfort for 4 months, dysphagia for both solids and liquids at times. + Weight loss   05/04/2022 Cancer Staging   Staging form: Esophagus -  Squamous Cell Carcinoma, AJCC 8th Edition - Clinical: Stage II (cT3, cN0, cM0, L: Lower) - Signed by [name], MD on 05/31/2022 Stage prefix: Initial diagnosis Total positive nodes: 0   05/09/2022 Imaging   PET scan showed intense tracer uptake associated with the distal esophageal mass just above the level of the GE junction compatible with primary esophageal neoplasm. No signs of tracer avid nodal metastasis or solid organ metastasis.   05/20/2022 Procedure   EUS by Dr.Spaete.  T3,Nx 05/20/2022, esophagus mass biopsy showed invasive moderately differentiated squamous cell carcinoma, with basaloid features and minimal keratinization.  Background squamous cell carcinoma in situ.   06/03/2022 -  Chemotherapy   Patient is on Treatment Plan :  ESOPHAGUS Carboplatin/PACLitaxel weekly x 6 weeks with XRT       07/09/2022 - 07/12/2022 Hospital Admission   Hospitalization due to COPD exacerbation.       Patient has multiple comorbidities including COPD/chronic respiratory failure, history of STEMI/combined systolic and diastolic CHF [last LVEF 40 to 45%], lung nodules previously avid on PET scan, biopsy negative and was felt to be calcified granulomas.-Pulmonology Dr. Patsey Berthold Patient lives by himself.  Independent with ADL and some of the iADL  Interval History: Leslie Sims 71 y.o. male who returns to clinic for follow up. He is s/p concurrent chemo and radiation. He is continuing to try to eat more solid, thickened foods. Has chronic difficulty swallowing but it's improving. Persistent fatigue. Has started home health PT and OT which he thinks might be helping. Has stress test with cardiology scheduled for tomorrow.    Review of Systems  Constitutional:  Positive for fatigue. Negative for appetite change and unexpected weight change.  HENT:   Positive for trouble swallowing. Negative for mouth sores and sore  throat.   Respiratory:  Positive for shortness of breath. Negative for chest  tightness.   Cardiovascular:  Negative for leg swelling.  Gastrointestinal:  Negative for abdominal pain, constipation, diarrhea, nausea and vomiting.  Genitourinary:  Negative for bladder incontinence and dysuria.   Musculoskeletal:  Negative for flank pain and neck stiffness.  Skin:  Negative for itching, rash and wound.  Neurological:  Negative for dizziness, headaches, light-headedness and numbness.  Hematological:  Negative for adenopathy. Does not bruise/bleed easily.  Psychiatric/Behavioral:  Negative for confusion, depression and sleep disturbance. The patient is not nervous/anxious.     Past Medical History:  Diagnosis Date   Allergic rhinitis    Anxiety    Back pain    Cancer (HCC)    prostate   CHF (congestive heart failure) ([place])    Colon polyps 08/2012   colonoscopy; multiple colon polyps; repeat colonoscopy in one year.   Complication of anesthesia    COPD (chronic obstructive pulmonary disease) ([place])    a. on home O2.   Coronary artery disease 11/2009   a. late presenting ant MI; b. LHC 100% pLAD s/p PCI/DES, 99% mRCA s/p PCI/DES, EF 35%; c. nuclear stress test 05/13: prior ant/inf infarcts w/o ischemia, EF 43%; d. LHC 02/14: widely patent stents with no other obs dz, EF 40%; e. LHC 11/17: LM nl, mLAD 10%, patent LAD stent, dLAD 20%, p-mRCA 10%, mRCA 40%, patent RCA stent, EF 35-45%    Depression    Dysphagia    Family history of adverse reaction to anesthesia    GERD (gastroesophageal reflux disease)    [id number](517.0)    Helicobacter pylori (H. pylori)    Hemorrhoids    Hernia    inguinal   HFrEF (heart failure with reduced ejection fraction) ([place])    a. 10/2016 LV gram: EF 35-45%; b. 06/2018 Echo: EF 40-45%. c. 01/2021 Echo: EF 40-45%   Hyperlipidemia    Hypertension    Insomnia    Ischemic cardiomyopathy    a. 10/2016 LV gram: EF 35-45%; b. 06/2018 Echo: EF 40-45%.   Myalgia    Oxygen dependent    Peptic ulcer    Pulmonary nodules    ST elevation (STEMI)  myocardial infarction involving left anterior descending coronary artery ([place]) 11/2009   a. s/p PCI to the LAD   Status post dilation of esophageal narrowing 2000   Thickening of esophagus    Tinea pedis    Wears dentures    partial upper   Past Surgical History:  Procedure Laterality Date    coronary stents     Admission  12/20/2012   COPD exacerbation.  [place].   CARDIAC CATHETERIZATION     CARDIAC CATHETERIZATION  02/05/2013   [facility]   CARDIAC CATHETERIZATION  09/19/2013   [facility] : patent stents with no change in anatomy. EF: 40$   CARDIAC CATHETERIZATION Left 11/12/2016   Procedure: Left Heart Cath and Coronary Angiography;  Surgeon: Wellington Hampshire, MD;  Location: [facility];  Service: Cardiovascular;  Laterality: Left;   COLONOSCOPY     COLONOSCOPY WITH PROPOFOL N/A 05/18/2016   Procedure: COLONOSCOPY WITH PROPOFOL;  Surgeon: Lucilla Lame, MD;  Location: [facility] ENDOSCOPY;  Service: Endoscopy;  Laterality: N/A;   COLONOSCOPY WITH PROPOFOL N/A 03/19/2020   Procedure: COLONOSCOPY WITH PROPOFOL;  Surgeon: Lin Landsman, MD;  Location: [facility] ENDOSCOPY;  Service: Gastroenterology;  Laterality: N/A;   CORONARY ANGIOPLASTY WITH STENT PLACEMENT  09/19/2009   LAD 3.0 X23 mm Xience DES, RCA:  4.0 X 15 mm Xience DES   ELECTROMAGNETIC NAVIGATION BROCHOSCOPY N/A 10/27/2017   Procedure: ELECTROMAGNETIC NAVIGATION BRONCHOSCOPY;  Surgeon: Flora Lipps, MD;  Location: [facility] ORS;  Service: Cardiopulmonary;  Laterality: N/A;   ESOPHAGEAL DILATION     ESOPHAGOGASTRODUODENOSCOPY  12/20/2006   ESOPHAGOGASTRODUODENOSCOPY  08/20/2012   ESOPHAGOGASTRODUODENOSCOPY (EGD) WITH PROPOFOL N/A 04/19/2022   Procedure: ESOPHAGOGASTRODUODENOSCOPY (EGD) WITH PROPOFOL;  Surgeon: [name], MD;  Location: [facility] ENDOSCOPY;  Service: Endoscopy;  Laterality: N/A;   ESOPHAGOGASTRODUODENOSCOPY (EGD) WITH PROPOFOL N/A 04/27/2022   Procedure: ESOPHAGOGASTRODUODENOSCOPY (EGD) WITH PROPOFOL;  Surgeon:  [name], MD;  Location: [facility] ENDOSCOPY;  Service: Endoscopy;  Laterality: N/A;   EUS N/A 05/20/2022   Procedure: UPPER ENDOSCOPIC ULTRASOUND (EUS) LINEAR;  Surgeon: Reita Cliche, MD;  Location: [facility] ENDOSCOPY;  Service: Gastroenterology;  Laterality: N/A;  [facility]   HERNIA REPAIR  07/20/2012   L inguinal hernia repair   PENILE PROSTHESIS IMPLANT     Allergies  Allergen Reactions   Prozac [Fluoxetine Hcl] Shortness Of Breath   Hydroxyzine    Bupropion Other (See Comments)    "Makes me feel funny" Other reaction(s): Other (See Comments) Unknown/"made me feel real funny" "Makes me feel funny" "Makes me feel funny" Other reaction(s): Other (See Comments) Unknown/"made me feel real funny" "Makes me feel funny"   Effexor Xr [Venlafaxine Hcl Er] Other (See Comments)    "Makes me feel funny"   Current Outpatient Medications on File Prior to Visit  Medication Sig Dispense Refill   albuterol (VENTOLIN HFA) 108 (90 Base) MCG/ACT inhaler Inhale 2 puffs into the lungs every 6 (six) hours as needed for wheezing or shortness of breath. 18 g 4   aspirin 81 MG tablet Take 81 mg by mouth daily.     atorvastatin (LIPITOR) 80 MG tablet Take 1 tablet (80 mg total) by mouth daily. 90 tablet 3   budesonide (PULMICORT) 0.5 MG/2ML nebulizer solution Take 2 mLs (0.5 mg total) by nebulization in the morning and at bedtime. 320 mL 12   docusate sodium (COLACE) 100 MG capsule Take 1 capsule (100 mg total) by mouth 2 (two) times daily. 60 capsule 0   esomeprazole (NEXIUM) 40 MG capsule TAKE ONE CAPSULE BY MOUTH ONCE DAILY 30 capsule 4   fluticasone (FLONASE) 50 MCG/ACT nasal spray Place 2 sprays into both nostrils daily.     ipratropium-albuterol (DUONEB) 0.5-2.5 (3) MG/3ML SOLN Take 3 mLs by nebulization 2 (two) times daily.     loratadine (CLARITIN) 10 MG tablet Take 10 mg by mouth daily.     LORazepam (ATIVAN) 1 MG tablet Take 1 tablet (1 mg total) by mouth every 4 (four) hours while awake.  (Patient taking differently: Take 1 mg by mouth 3 (three) times daily.) 30 tablet 0   losartan (COZAAR) 25 MG tablet Take 25 mg by mouth daily.     mirtazapine (REMERON) 45 MG tablet Take 45 mg by mouth at bedtime.     Multiple Vitamin (MULTIVITAMIN) capsule Take 1 capsule by mouth daily.     nitroGLYCERIN (NITROSTAT) 0.4 MG SL tablet Place 1 tablet (0.4 mg total) under the tongue every 5 (five) minutes as needed. (Patient not taking: Reported on 09/06/2022) 25 tablet 3   OXYGEN Inhale 2.5 L into the lungs daily.     polyethylene glycol (MIRALAX) 17 g packet Take 17 g by mouth daily as needed.     Spacer/Aero-Holding Chambers (AEROCHAMBER MV) inhaler Use as instructed 1 each 2   sucralfate (CARAFATE)  1 g tablet Take 1 g by mouth 2 (two) times daily with a meal.     [DISCONTINUED] prochlorperazine (COMPAZINE) 10 MG tablet Take 1 tablet (10 mg total) by mouth every 6 (six) hours as needed (Nausea or vomiting). 30 tablet 1   No current facility-administered medications on file prior to visit.   Family History  Problem Relation Age of Onset   Heart attack Brother        Brother #1   Diabetes Brother    Hypertension Brother        #3   Coronary artery disease Father 10       deceased   Heart attack Father    Diabetes Father    Heart disease Father    COPD Mother 43       deceased   Alcohol abuse Sister        polysubstance abuse   COPD Sister    Lung cancer Sister    Alcohol abuse Sister        polysubstance abuse   Penile cancer Brother    Diabetes Brother    Prostate cancer Neg Hx    Bladder Cancer Neg Hx    Kidney cancer Neg Hx    Social History   Socioeconomic History   Marital status: Widowed    Spouse name: Not on file   Number of children: 5   Years of education: Not on file   Highest education level: Not on file  Occupational History   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  Vaping Use   Vaping Use: Never used  Substance and Sexual Activity   Alcohol use: No    Alcohol/week: 0.0 standard drinks of alcohol   Drug use: No   Sexual activity: Yes  Other Topics Concern   Not on file  Social History Narrative   Marital status: married x 32 years; happily married.      Children: 4 children;  1 stepchild and 15 grandchildren; no great grandchildren.      Lives: with wife; 2 dogs, 1 cat.      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      Tobacco: 1 ppd x 45 years      Alcohol: none; previous alcoholism      Drugs: none      Exercise: no formal exercise; physically demanding job; owns horses.      Seatbelts:  100%      Guns: one loaded unsecured gun in the home.      Sunscreen: none   Social Determinants of Radio broadcast assistant Strain: Not on file  Food Insecurity: Not on file  Transportation Needs: Not on file  Physical Activity: Not on file  Stress: Not on file  Social Connections: Not on file    Objective:  Blood pressure 120/68, pulse 91, temperature 98.1 F (36.7 C), temperature source Tympanic, resp. rate 18, weight 178 lb (80.7 kg), SpO2 98 %.  Wt Readings from Last 3 Encounters:  08/02/22 178 lb (80.7 kg)  07/28/22 180 lb 2 oz (81.7 kg)  07/23/22 178 lb 8 oz (81 kg)    Body mass index is 25.54 kg/m.  Performance status (ECOG): 2 - Symptomatic, <50% confined to bed  Physical Exam Constitutional:      Appearance: He is not ill-appearing.     Interventions: Nasal cannula in place.  Eyes:     General: No scleral icterus.    Conjunctiva/sclera: Conjunctivae normal.  Cardiovascular:     Rate and Rhythm: Normal rate and regular rhythm.  Pulmonary:     Breath sounds: Decreased air movement present.   Abdominal:     General: There is no distension.     Palpations: Abdomen is soft.     Tenderness: There is no abdominal tenderness. There is no guarding.  Musculoskeletal:        General: No deformity.     Right lower leg: No edema.     Left lower leg: No edema.  Lymphadenopathy:     Cervical: No cervical adenopathy.  Skin:    General: Skin is warm and dry.  Neurological:     Mental Status: He is alert and oriented to person, place, and time. Mental status is at baseline.  Psychiatric:        Mood and Affect: Mood normal.        Behavior: Behavior normal.         Latest Ref Rng & Units 08/02/2022   10:32 AM 07/23/2022    9:03 AM 07/16/2022    9:07 AM  CBC  WBC 4.0 - 10.5 K/uL 2.8  4.3  7.0   Hemoglobin 13.0 - 17.0 g/dL 12.4  12.6  13.3   Hematocrit 39.0 - 52.0 % 39.0  39.5  41.6   Platelets 150 - 400 K/uL 164  142  144       Latest Ref Rng & Units 08/02/2022   10:32 AM 07/23/2022    9:03 AM 07/16/2022    9:07 AM  CMP  Glucose 70 - 99 mg/dL 129  100  128   BUN 8 - 23 mg/dL '17  16  17   '$ Creatinine 0.61 - 1.24 mg/dL 0.84  0.84  0.93   Sodium 135 - 145 mmol/L 137  141  141   Potassium 3.5 - 5.1 mmol/L 3.7  3.5  3.8   Chloride 98 - 111 mmol/L 95  100  99   CO2 22 - 32 mmol/L 32  33  34   Calcium 8.9 - 10.3 mg/dL 8.9  9.2  9.6   Total Protein 6.5 - 8.1 g/dL 6.6  6.9  7.0   Total Bilirubin 0.3 - 1.2 mg/dL 0.2  0.2  0.7   Alkaline Phos 38 - 126 U/L 57  60  69   AST 15 - 41 U/L '20  20  17   '$ ALT 0 - 44 U/L '24  25  22    '$ DG Chest 2 View  Result Date: 07/21/2022 CLINICAL DATA:  Left anterior chest pain, no reported injury COPD EXAM: CHEST - 2 VIEW; LEFT RIBS - 2 VIEW COMPARISON:  07/09/2022 FINDINGS: The heart size and mediastinal contours are within normal limits. Pulmonary hyperinflation and emphysema. No acute airspace opacity. The visualized skeletal structures are unremarkable. No displaced fracture or other radiographic abnormality of the left ribs. IMPRESSION: 1. Pulmonary  hyperinflation and emphysema. No acute airspace opacity. 2. No displaced fracture or other radiographic abnormality of the left ribs. Electronically Signed   By: Delanna Ahmadi M.D.   On: 07/21/2022 13:47  DG Ribs Unilateral Left  Result Date: 07/21/2022 CLINICAL DATA:  Left anterior chest pain, no reported injury COPD EXAM: CHEST - 2 VIEW; LEFT RIBS - 2 VIEW COMPARISON:  07/09/2022 FINDINGS: The heart size and mediastinal contours are within normal limits. Pulmonary hyperinflation and emphysema. No acute airspace opacity. The visualized skeletal structures are unremarkable. No displaced fracture or other radiographic abnormality of the left ribs. IMPRESSION: 1. Pulmonary hyperinflation and emphysema. No acute airspace opacity. 2. No displaced fracture or other radiographic abnormality of the left ribs. Electronically Signed   By: Delanna Ahmadi M.D.   On: 07/21/2022 13:47   DG Chest Port 1 View  Result Date: 07/09/2022 CLINICAL DATA:  Chest pain, shortness of breath over the past week EXAM: PORTABLE CHEST 1 VIEW COMPARISON:  03/15/2022, 07/09/2022 FINDINGS: 7 mm right upper lobe pulmonary nodule. Faintly visualized 9 mm left upper lobe pulmonary nodule. Bilateral upper lobe pulmonary nodules were better characterized on the CT of the chest dated 07/09/2022 performed earlier same day. Bilateral mild interstitial thickening. Bilateral emphysematous changes. No focal consolidation. No pleural effusion or pneumothorax. Heart and mediastinal contours are unremarkable. No acute osseous abnormality. IMPRESSION: 1. No acute cardiopulmonary disease. Electronically Signed   By: Kathreen Devoid M.D.   On: 07/09/2022 10:40   CT Angio Chest PE W and/or Wo Contrast  Result Date: 07/09/2022 CLINICAL DATA:  Pulmonary embolism high probability, shortness of breath and chest pain. History of esophageal neoplasm. * Tracking Code: BO * EXAM: CT ANGIOGRAPHY CHEST WITH CONTRAST TECHNIQUE: Multidetector CT imaging of the chest was  performed using the standard protocol during bolus administration of intravenous contrast. Multiplanar CT image reconstructions and MIPs were obtained to evaluate the vascular anatomy. RADIATION DOSE REDUCTION: This exam was performed according to the departmental dose-optimization program which includes automated exposure control, adjustment of the mA and/or kV according to patient size and/or use of iterative reconstruction technique. CONTRAST:  21m OMNIPAQUE IOHEXOL 350 MG/ML SOLN COMPARISON:  PET-CT May 07, 2022. FINDINGS: Cardiovascular: Satisfactory opacification of the pulmonary arteries to the segmental level. No evidence of pulmonary embolism. Aortic atherosclerosis. Normal heart size. No pericardial effusion. Mediastinum/Nodes: No suspicious thyroid nodule. No pathologically enlarged mediastinal, hilar or axillary lymph nodes. Stable calcified 15 mm nodule in the right axilla. Marked distal esophageal wall thickening with patulous gas and fluid filled proximal esophagus similar prior PET-CT. Lungs/Pleura: Stable 9 mm left upper lobe pulmonary nodule which was not hypermetabolic on prior PET-CT. No new suspicious pulmonary nodules or masses. Bilateral calcified granulomata. Stable upper lobe predominant paraseptal and centrilobular emphysematous change with multifocal scarring including a bandlike area in the right upper lobe. Upper Abdomen: No acute abnormality.  Calcified splenic granulomata. Musculoskeletal: No aggressive lytic or blastic lesion of bone. Multilevel degenerative changes spine. Review of the MIP images confirms the above findings. IMPRESSION: 1. No evidence of pulmonary embolus. 2. Similar appearance of the masslike distal esophageal wall thickening consistent with patient's known esophageal carcinoma. 3. Similar patulous appearance of the proximal esophagus with fluid and gas filling the esophagus, almost certainly reflecting a measure of distal esophageal obstruction, quantification of  which can not be determined on this examination. 4. No evidence of metastatic disease in the chest. 5. Stable emphysematous change and multifocal pulmonary scarring. 6. Aortic Atherosclerosis (ICD10-I70.0) and Emphysema (ICD10-J43.9). Electronically Signed   By: JDahlia BailiffM.D.   On: 07/09/2022 10:22   NM PET Image Initial (PI) Skull Base To Thigh (F-18 FDG)  Result Date: 05/09/2022 CLINICAL DATA:  Subsequent  treatment strategy for esophageal neoplasm. EXAM: NUCLEAR MEDICINE PET SKULL BASE TO THIGH TECHNIQUE: 10.24 mCi F-18 FDG was injected intravenously. Full-ring PET imaging was performed from the skull base to thigh after the radiotracer. CT data was obtained and used for attenuation correction and anatomic localization. Fasting blood glucose: 107 mg/dl COMPARISON:  PET-CT 10/13/2017. CT chest 01/21/2021. CT AP 12/21/2021 and MRI abdomen 04/26/2022 FINDINGS: Mediastinal blood pool activity: SUV max 2.24 Liver activity: SUV max NA NECK: No hypermetabolic lymph nodes in the neck. Incidental CT findings: None CHEST: No tracer avid axillary, supraclavicular, mediastinal, or hilar lymph nodes. Mass involving the distal esophagus measures 5.1 x 3.1 by 4.8 cm and has an SUV max of 12.82, image 133/2. The more proximal portions of the esophagus appear mildly dilated and patulous. Marked narrowing of the esophageal lumen is noted at the level of the tumor. Incidental CT findings: No tracer avid lung nodules identified at this time. Partially calcified nodule within the left upper lobe is again noted and is stable measuring 9 mm, image 84/2. Stable bandlike area of scarring within the anterior right upper lobe. Aortic atherosclerosis and coronary artery calcifications. Moderate emphysema. ABDOMEN/PELVIS: No abnormal tracer uptake identified within the liver, pancreas, spleen, or adrenal glands. No tracer avid abdominopelvic lymph nodes. Incidental CT findings: Bilateral adrenal nodules are again noted, previously  characterized as benign adenomas. No follow-up recommended. Penile prosthesis is identified. Bilateral fat containing inguinal hernias, right greater than left. SKELETON: No focal hypermetabolic activity to suggest skeletal metastasis. Incidental CT findings: none IMPRESSION: 1. There is intense tracer uptake associated with the distal esophageal mass just above the level of the GE junction compatible with primary esophageal neoplasm. 2. No signs of tracer avid nodal metastasis or solid organ metastasis. 3. Aortic Atherosclerosis (ICD10-I70.0) and Emphysema (ICD10-J43.9). 4. Coronary artery calcifications. Electronically Signed   By: Kerby Moors M.D.   On: 05/09/2022 16:41     Assessment & Plan:  Cancer Staging  Primary esophageal squamous cell carcinoma (HCC) Staging form: Esophagus - Squamous Cell Carcinoma, AJCC 8th Edition - Clinical: Stage II (cT3, cN0, cM0, L: Lower) - Signed by [name], MD on 05/31/2022  Prostate cancer [facility]) Staging form: Prostate, AJCC 8th Edition - Clinical: cT2c, cN0, cM0, Grade Group: 2 - Signed by [name], MD on 05/04/2022  Primary esophageal Squamous Cell carcinoma- s/p concurrent carbo-taxol chemotherapy with radiation. Completed 8;7/23. Continues to recover from treatment. Gradually improving. He will have a PET scan for re-staging prior to follow up visit with Dr. Tasia Catchings.  Prostate Cancer- gleason score 3+4 s/p definitive radiation. Follow up with Dr. Diamantina [place] Dysphagia- gradually improving. Offered referral to speech therapy which he declined d/t improving symptoms.  Weight loss- weight is roughly stable. Continue nutritional supplements.  ` Follow up as scheduled with Dr. Tasia Catchings  The patient understands the plans discussed today and is in agreement with them.  He knows to contact our office if he develops concerns prior to his next appointment.  Verlon Au, NP

## 2022-08-03 ENCOUNTER — Encounter
Admission: RE | Admit: 2022-08-03 | Discharge: 2022-08-03 | Disposition: A | Discharge status: Home / Self Care | Payer: Medicare Other | Source: Ambulatory Visit | Attending: Medical | Admitting: Medical

## 2022-08-03 DIAGNOSIS — R079 Chest pain, unspecified: Secondary | ICD-10-CM

## 2022-08-03 LAB — NM MYOCAR MULTI W/SPECT W/WALL MOTION / EF
Base ST Depression (mm): 0 mm
LV dias vol: 83 mL (ref 62–150)
LV sys vol: 43 mL
Nuc Stress EF: 48 %
Peak HR: 94 {beats}/min
Percent HR: 61 %
Rest HR: 72 {beats}/min
Rest Nuclear Isotope Dose: 9.9 mCi
SDS: 0
SRS: 27
SSS: 23
ST Depression (mm): 0 mm
Stress Nuclear Isotope Dose: 30.6 mCi
TID: 0.96

## 2022-08-03 MED ORDER — REGADENOSON 0.4 MG/5ML IV SOLN
0.4000 mg | Freq: Once | INTRAVENOUS | Status: AC
  Administered 2022-08-03: 0.4 mg via INTRAVENOUS

## 2022-08-03 MED ORDER — TECHNETIUM TC 99M TETROFOSMIN IV KIT
30.5700 | PACK | Freq: Once | INTRAVENOUS | Status: AC | PRN
  Administered 2022-08-03: 30.57 via INTRAVENOUS

## 2022-08-03 MED ORDER — TECHNETIUM TC 99M TETROFOSMIN IV KIT
10.0000 | PACK | Freq: Once | INTRAVENOUS | Status: AC | PRN
  Administered 2022-08-03: 9.93 via INTRAVENOUS

## 2022-08-05 ENCOUNTER — Ambulatory Visit (INDEPENDENT_AMBULATORY_CARE_PROVIDER_SITE_OTHER): Payer: Medicare Other | Admitting: Pulmonary Disease

## 2022-08-05 ENCOUNTER — Encounter: Payer: Self-pay | Admitting: Pulmonary Disease

## 2022-08-05 VITALS — BP 120/60 | HR 106 | Temp 98.1°F | Ht 70.0 in | Wt 179.2 lb

## 2022-08-05 DIAGNOSIS — J449 Chronic obstructive pulmonary disease, unspecified: Secondary | ICD-10-CM

## 2022-08-05 DIAGNOSIS — I5042 Chronic combined systolic (congestive) and diastolic (congestive) heart failure: Secondary | ICD-10-CM | POA: Diagnosis not present

## 2022-08-05 DIAGNOSIS — J9611 Chronic respiratory failure with hypoxia: Secondary | ICD-10-CM

## 2022-08-05 DIAGNOSIS — R918 Other nonspecific abnormal finding of lung field: Secondary | ICD-10-CM

## 2022-08-05 DIAGNOSIS — C159 Malignant neoplasm of esophagus, unspecified: Secondary | ICD-10-CM

## 2022-08-05 MED ORDER — AZITHROMYCIN 250 MG PO TABS: ORAL_TABLET | ORAL | 0 refills | Status: AC

## 2022-08-05 NOTE — Patient Instructions (Signed)
We have sent in a prescription for your Azithromycin (antibiotic) to the pharmacy.  We will see you in follow-up in 4 to 6 weeks time call sooner should any new problems arise.

## 2022-08-05 NOTE — Progress Notes (Signed)
Subjective:    Patient ID: Leslie Sims, male    DOB: 04-May-1951, 71 y.o.   MRN: [id number]  Patient Care Team: [name], MD as PCP - General (Family Medicine) Wellington Hampshire, MD as PCP - Cardiology (Cardiology) Wellington Hampshire, MD as Consulting Physician (Cardiology) Telford Nab, RN as Registered Nurse [name], MD as Consulting Physician (Pulmonary Disease) Noreene Filbert, MD as Radiation Oncologist (Radiation Oncology) Clent Jacks, RN as Oncology Nurse Navigator [name], MD as Consulting Physician (Oncology)  Chief Complaint  Patient presents with   Follow-up    SOB with exertion and at rest and wheezing.      INTERVAL: Last seen by  me on 04/29/2022.   Multiple visits to ED due to congestive heart failure, chest pain and COPD exacerbations.  Has had issues with postprandial vomiting and abdominal pain.  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  He is on concurrent chemotherapy with carboplatin/Taxol weekly and radiation.  He completed radiation on 26 July 2022  HPI The patient is a 71 year old former smoker (quit 2020, 126 PY) with severe stage IV COPD and chronic hypoxic respiratory failure on supplemental oxygen who presents here for follow-up on these issues.  His respiratory status is also compromised by ischemic cardiomyopathy with LVEF of 40% and grade II diastolic dysfunction giving him chronic combined systolic and diastolic heart failure.  He has frequent visits to the ED due to COPD exacerbations and CHF exacerbations.  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  He has been undergoing concurrent chemo therapy with carboplatin/Taxol and radiation.  He completed radiation 26 July 2022.  He has tolerated therapy surprisingly well.  He has been having issues with swallowing after the therapy but this is improving day by day.  He has started  PT/OT at home and feels that this helps.  Recent nuclear medicine stress test was negative for active ischemia.  He does not endorse any fevers, chills or sweats.  He has had some cough productive of yellowish to greenish sputum.  He is concerned that this may turn into pneumonia.  Shortness of breath is at baseline.  He is compliant with oxygen therapy.  He does not endorse any other symptomatology.  He has not had to escalate the liter flow of his oxygen.  DATA: 07/24/12 Office Spirometry: Severe obstruction (FEV1 31% pred) 08/31/13 Office Spirometry: very severe obstruction (FEV1 25% pred) 06/18/15 CT chest: Severe emphysema 03/03/17 Overnight oximetry: Lowest SpO2 72%. 95% of time SpO2 was below 90% 03/03/17 6MWT: 384 meters. Desat to 84% 10/04/17 CT chest: Two new irregularly marginated nodules, one in the RUL, and a second in the mid LUL worrisome for either synchronous lung ca or metastatic involvement of the lungs. The two small nodules described on the prior dictated report have resolved in the left superior segment and most likely were postinflammatory. Diffuse centrilobular emphysema. 10/13/17 PET scan: The new left upper lobe nodule is FDG avid consistent with malignancy. Recommend tissue confirmation. The new nodule in the right upper lobe demonstrates low level uptake. While the level of uptake suggests the possibility of an inflammatory or infectious process, a low-grade malignancy is not excluded. Given the suspected malignancy on the left, tissue confirmation should be considered on the right despite the low level of uptake. No distant metastatic disease identified 10/27/17 ENB ([place]): Transbronchial Fine Needle Aspirations 21G X7,  Transbronchial Forceps Biopsy X6. ALVEOLATED LUNG TISSUE WITH HEMOSIDERIN LADEN MACROPHAGES.  NEGATIVE FOR ATYPIA AND MALIGNANCY 11/01/17 PFTs: FVC: 2.96 > 3.14 L (68 > 72 %pred), FEV1: 0.94 > 0.93 L (27 %pred), FEV1/FVC: 32%, TLC: 9.04 L (137 %pred), DLCO 62  %pred 12/07/17 CTA chest: No pulmonary embolus identified. Anterior left upper lobe ground-glass and nodular consolidation likely representing pneumonitis. Stable bilateral upper lobe pulmonary nodules. Stable emphysema. 01/21/18 CT chest: new masslike architectural distortion and ground-glass attenuation surrounding the index nodule within the RUL. The appearance favors an inflammatory or infectious process. The index nodule in the left upper lobe is slightly decreased in volume when compared with the previous exam favoring a benign process. Attention on follow-up imaging advise. Advanced changes of emphysema (very severe) 06/01/18 CTA chest: No evidence of pulmonary embolism. Waxing and waning bilateral pulmonary nodular process as described. New 8 mm nodular ill-defined focus of opacification over the lingula which may represent an acute inflammatory or infectious process. No effusion. Emphysema 10/19/18 CTA chest: No acute cardiopulmonary disease and no evidence of pulm embolism. Hospitalization 1/12-1/14/2020: COPD exacerbation Hospitalization 2/22-2/27/20: COPD exacerbation, HCAP 02/11/19 CT chest: Progressed masslike consolidation within the RUL in the region of previous waxing and waning nodule, this is contiguous with the pleural surface and demonstrates spiculated margin and probable small focus of central cavitation. Findings could be secondary to infection superimposed on chronic nodule. However cavitary neoplasm is also a concern. Multiple additional lung nodules which have not significantly changed. Small foci of ground-glass density in the RUL may reflect small focus of infection. Mod-severe emphysema 05/01/19 CT chest: Waxing/waning right lung opacities, suggesting infection, including atypical/mycobacterial infection. Improvement of the prior dominant masslike opacity argues against neoplasm. Stable left upper lobe nodule, likely benign. Aortic Atherosclerosis and Emphysema 11/02/2019 CT  chest: Multiple larger pulmonary nodules and masslike areas seen on the prior study have largely regressed.  Findings indicative of infectious/inflammatory process.  Diffuse bronchial wall thickening with moderate centrilobular and paraseptal emphysema.  Suggestion of left main and three-vessel coronary artery disease. 04/23/2020 CT chest: "Multiple bilateral stable pulmonary nodules. New 7 mm nodule over the posterior left upper lobe. Stable nodularity over an area ofright upper lobe/apical scarring with new 9 mm nodular focus along the most inferior aspect of this scarring. This area has shown to wax and wane on previous exams and is most likely inflammatory". 07/24/2020 CT chest: "Background of marked pulmonary emphysema with stable post infectious or inflammatory changes and with near complete resolution of a presumably infectious or inflammatory nodule in the LEFT upper lobe as compared to previous exam". 01/21/2021 CT chest: Severe emphysematous changes, pulmonary scarring, bilateral calcified granulomas. 01/29/2021 PFTs: Further decline in lung function.  FEV1 0.74 L or 22% predicted, FVC 1.91 L or 42% predicted.  No significant bronchodilator response.  FEV1/FVC 39%.  Patient could not perform lung volumes.  Diffusion capacity severely reduced.  This is consistent with very severe COPD. 02/13/2021 2D echo: LVEF 40 to 45% global hypokinesis, severe hypokinesis of the periapical region. Grade II diastolic dysfunction, dilated left atrium. 08/03/2022 Myoview: No evidence of ischemia, large fixed defect, pulm function 55%, calcifications in the LAD  Review of Systems A 10 point review of systems was performed and it is as noted above otherwise negative.  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   Anemia due to antineoplastic chemotherapy 06/26/2022   Constipation 06/19/2022   Encounter for antineoplastic chemotherapy 05/31/2022   Weight loss 05/31/2022  Dysphagia 05/31/2022   Primary esophageal squamous cell carcinoma ([place]) 05/04/2022   COPD exacerbation ([place]) 07/31/2021   Prostate cancer ([place]) 10/11/2020   Elevated PSA 07/28/2020   Iron deficiency anemia 05/20/2020   Goals of care, counseling/discussion    Palliative care by specialist    Healthcare-associated pneumonia 01/28/2019   COPD with acute exacerbation ([place]) 12/31/2018   Generalized anxiety disorder 11/12/2018   Moderate episode of recurrent major depressive disorder ([place]) 11/12/2018   Chronic respiratory failure with hypoxia ([place]) 09/29/2018   Acute respiratory failure with hypoxia and hypercapnia ([place]) 09/13/2018   Acute on chronic respiratory failure with hypoxia and hypercapnia (HCC) 08/24/2018   Atypical chest pain 07/02/2018   Grief reaction 01/13/2018   COPD with hypoxia ([place]) 12/25/2017   Unstable angina (HCC)    Benign neoplasm of ascending colon    Benign neoplasm of descending colon    Benign neoplasm of sigmoid colon    History of colonic polyps    Pulmonary nodules/lesions, multiple 01/05/2015   Bruit of left carotid artery 11/04/2014   Sleep disorder 07/18/2013   Seasonal and perennial allergic rhinitis 05/29/2013   Bradycardia 04/20/2011   SMOKER 07/30/2010   CAD, NATIVE VESSEL 04/23/2010   Hyperlipidemia 03/24/2010   DEPRESSION/ANXIETY 03/24/2010   Chronic heart failure with preserved ejection fraction (HFpEF) ([place]) 03/24/2010   COPD, very severe ([employer]) 03/24/2010   GERD 03/24/2010   Essential hypertension 11/21/2009   Social History   Tobacco Use   Smoking status: Former    Packs/day: 3.00    Years: 42.00    Total pack years: 126.00    Types: Cigarettes    Quit date: 02/10/2019    Years since quitting: 4.0   Smokeless tobacco: Never   Tobacco comments:    25 months ago most smoked 4 packs a day .  Substance Use Topics   Alcohol use: No    Alcohol/week: 0.0 standard drinks of alcohol   Allergies  Allergen Reactions   Prozac [Fluoxetine  Hcl] Shortness Of Breath   Hydroxyzine    Bupropion Other (See Comments)    "Makes me feel funny" Other reaction(s): Other (See Comments) Unknown/"made me feel real funny" "Makes me feel funny" "Makes me feel funny" Other reaction(s): Other (See Comments) Unknown/"made me feel real funny" "Makes me feel funny"   Effexor Xr [Venlafaxine Hcl Er] Other (See Comments)    "Makes me feel funny"   Current Meds  Medication Sig   albuterol (VENTOLIN HFA) 108 (90 Base) MCG/ACT inhaler Inhale 2 puffs into the lungs every 6 (six) hours as needed for wheezing or shortness of breath.   aspirin 81 MG tablet Take 81 mg by mouth daily.   budesonide (PULMICORT) 0.5 MG/2ML nebulizer solution Take 2 mLs (0.5 mg total) by nebulization in the morning and at bedtime.   docusate sodium (COLACE) 100 MG capsule Take 1 capsule (100 mg total) by mouth 2 (two) times daily.   esomeprazole (NEXIUM) 40 MG capsule TAKE ONE CAPSULE BY MOUTH ONCE DAILY   fluticasone (FLONASE) 50 MCG/ACT nasal spray Place 2 sprays into both nostrils daily.   furosemide (LASIX) 40 MG tablet TAKE ONE TABLET BY MOUTH ONCE DAILY   ipratropium-albuterol (DUONEB) 0.5-2.5 (3) MG/3ML SOLN Take 3 mLs by nebulization 2 (two) times daily.   lidocaine-prilocaine (EMLA) cream Apply to affected area once   loratadine (CLARITIN) 10 MG tablet Take 10 mg by mouth daily.   LORazepam (ATIVAN) 1 MG tablet Take 1 tablet (1 mg total) by mouth every  4 (four) hours while awake. (Patient taking differently: Take 1 mg by mouth 3 (three) times daily.)   losartan (COZAAR) 25 MG tablet Take 25 mg by mouth daily.   mirtazapine (REMERON) 45 MG tablet Take 45 mg by mouth at bedtime.   Multiple Vitamin (MULTIVITAMIN) capsule Take 1 capsule by mouth daily.   nitroGLYCERIN (NITROSTAT) 0.4 MG SL tablet Place 1 tablet (0.4 mg total) under the tongue every 5 (five) minutes as needed.   OXYGEN Inhale 2.5 L into the lungs daily.   polyethylene glycol (MIRALAX) 17 g packet Take  17 g by mouth daily as needed.   prochlorperazine (COMPAZINE) 10 MG tablet Take 1 tablet (10 mg total) by mouth every 6 (six) hours as needed (Nausea or vomiting).   Spacer/Aero-Holding Chambers (AEROCHAMBER MV) inhaler Use as instructed   sucralfate (CARAFATE) 1 g tablet Take 1 g by mouth 2 (two) times daily with a meal.   [DISCONTINUED] predniSONE (DELTASONE) 10 MG tablet Take 1 tablet (10 mg total) by mouth daily with breakfast.   Immunization History  Administered Date(s) Administered   Fluad Quad(high Dose 65+) 11/16/2021   Influenza Whole 09/19/2012   Influenza, High Dose Seasonal PF 09/28/2019   Influenza,inj,Quad PF,6+ Mos 10/24/2013, 11/25/2014, 03/02/2016, 10/27/2016, 10/05/2017   Influenza-Unspecified 10/24/2013, 11/25/2014, 03/02/2016, 10/27/2016, 10/05/2017, 09/18/2018, 09/28/2019, 09/30/2020   Moderna Sars-Covid-2 Vaccination 01/26/2020, 02/23/2020   PFIZER Comirnaty(Gray Top)Covid-19 Tri-Sucrose Vaccine 12/15/2020, 07/15/2021   PFIZER(Purple Top)SARS-COV-2 Vaccination 01/26/2020, 02/23/2020   PNEUMOCOCCAL CONJUGATE-20 03/22/2022   Pfizer Covid-19 Vaccine Bivalent Booster 78yr & up 01/14/2022, 05/25/2022   Pneumococcal Conjugate-13 11/25/2014   Pneumococcal Polysaccharide-23 08/24/2016, 10/27/2016   Pneumococcal-Unspecified 12/20/2008   Tdap 12/20/2008      Objective:   Physical Exam BP 120/60 (BP Location: Left Arm, Cuff Size: Normal)   Pulse (!) 106   Temp 98.1 F (36.7 C) (Temporal)   Ht '5\' 10"'$  (1.778 m)   Wt 179 lb 3.2 oz (81.3 kg)   SpO2 95%   BMI 25.71 kg/m   SpO2: 95 % O2 Device: Nasal cannula O2 Flow Rate (L/min): 3 L/min O2 Type: Pulse O2  GENERAL: Well-developed well-nourished gentleman, chronic use of accessories, no conversational dyspnea. No acute distress.  Presents in transport chair due to dyspnea. Using oxygen via portable concentrator at 2 L pulsed. HEAD: Normocephalic, atraumatic. EYES: Pupils equal, round, reactive to light.  No scleral  icterus. MOUTH: Wears dentures. NECK: Supple. No thyromegaly. No nodules. No JVD.  Trachea midline, no crepitus. PULMONARY: Very distant breath sounds, no adventitious sounds. CARDIOVASCULAR: S1 and S2. Regular rate and rhythm.  No rubs murmurs or gallops heard. GASTROINTESTINAL: Slightly protuberant abdomen, soft, benign. MUSCULOSKELETAL: No joint deformity, no clubbing, no edema. NEUROLOGIC: Awake, alert, oriented.  No overt focal deficits.  Speech is fluent. SKIN: Intact,warm,dry.  Limited exam: No rashes. PSYCH: Normal mood and behavior.     Assessment & Plan:     ICD-10-CM   1. Stage 4 very severe COPD by GOLD classification ([facility]  J44.9    Continue nebulization treatments Continue pulmonary hygiene Mild bronchitis: Azithromycin    2. Chronic hypoxemic respiratory failure (HCC)  J96.11    Continue oxygen at 3 L/min Patient compliant and notes benefit of therapy    3. Pulmonary nodules/lesions, multiple  R91.8     4. Chronic combined systolic and diastolic CHF (congestive heart failure) (HCC)  I50.42    This issue adds complexity to his management Follows with cardiology    5. Primary esophageal squamous cell carcinoma (HCC)  C15.9    This issue adds complexity to his management Following with oncology     Meds ordered this encounter  Medications   azithromycin (ZITHROMAX) 250 MG tablet    Sig: Take 2 tablets (500 mg) on  Day 1,  followed by 1 tablet (250 mg) once daily on Days 2 through 5.    Dispense:  6 each    Refill:  0   Surprisingly the patient has been tolerating chemoradiation well.  No major issues with regards to his COPD during therapy.  Will see the patient in follow-up in 4 to 6 weeks time he is to contact us prior to that time should any new difficulties arise.   Renold Don, MD Advanced Bronchoscopy PCCM [facility]    *This note was dictated using voice recognition software/Dragon.  Despite best efforts to proofread,  errors can occur which can change the meaning. Any transcriptional errors that result from this process are unintentional and may not be fully corrected at the time of dictation.

## 2022-08-06 ENCOUNTER — Other Ambulatory Visit: Payer: Self-pay

## 2022-08-19 ENCOUNTER — Other Ambulatory Visit: Payer: Self-pay | Admitting: Nurse Practitioner

## 2022-08-19 ENCOUNTER — Inpatient Hospital Stay: Payer: Medicare Other

## 2022-08-19 NOTE — Progress Notes (Signed)
Nutrition  Called patient for scheduled telephone nutrition visit.  No answer.  Left message with callback number.  Kelli Robeck B. Zenia Resides, [place], [place] Registered Dietitian [phone]

## 2022-08-20 ENCOUNTER — Ambulatory Visit (INDEPENDENT_AMBULATORY_CARE_PROVIDER_SITE_OTHER): Payer: Medicare Other | Admitting: Cardiology

## 2022-08-20 ENCOUNTER — Inpatient Hospital Stay: Payer: Medicare Other | Attending: Oncology

## 2022-08-20 ENCOUNTER — Encounter: Payer: Self-pay | Admitting: Nurse Practitioner

## 2022-08-20 VITALS — BP 134/70 | HR 103 | Ht 70.0 in | Wt 186.0 lb

## 2022-08-20 DIAGNOSIS — J9610 Chronic respiratory failure, unspecified whether with hypoxia or hypercapnia: Secondary | ICD-10-CM | POA: Insufficient documentation

## 2022-08-20 DIAGNOSIS — I2510 Atherosclerotic heart disease of native coronary artery without angina pectoris: Secondary | ICD-10-CM | POA: Insufficient documentation

## 2022-08-20 DIAGNOSIS — E782 Mixed hyperlipidemia: Secondary | ICD-10-CM | POA: Insufficient documentation

## 2022-08-20 DIAGNOSIS — C155 Malignant neoplasm of lower third of esophagus: Secondary | ICD-10-CM | POA: Diagnosis not present

## 2022-08-20 DIAGNOSIS — R079 Chest pain, unspecified: Secondary | ICD-10-CM | POA: Insufficient documentation

## 2022-08-20 DIAGNOSIS — I5022 Chronic systolic (congestive) heart failure: Secondary | ICD-10-CM | POA: Insufficient documentation

## 2022-08-20 DIAGNOSIS — Z87891 Personal history of nicotine dependence: Secondary | ICD-10-CM | POA: Diagnosis not present

## 2022-08-20 DIAGNOSIS — Z86010 Personal history of colonic polyps: Secondary | ICD-10-CM | POA: Diagnosis not present

## 2022-08-20 DIAGNOSIS — R634 Abnormal weight loss: Secondary | ICD-10-CM | POA: Insufficient documentation

## 2022-08-20 DIAGNOSIS — Z09 Encounter for follow-up examination after completed treatment for conditions other than malignant neoplasm: Secondary | ICD-10-CM | POA: Insufficient documentation

## 2022-08-20 DIAGNOSIS — I252 Old myocardial infarction: Secondary | ICD-10-CM | POA: Insufficient documentation

## 2022-08-20 DIAGNOSIS — C61 Malignant neoplasm of prostate: Secondary | ICD-10-CM | POA: Diagnosis not present

## 2022-08-20 DIAGNOSIS — Z8049 Family history of malignant neoplasm of other genital organs: Secondary | ICD-10-CM | POA: Diagnosis not present

## 2022-08-20 DIAGNOSIS — R0602 Shortness of breath: Secondary | ICD-10-CM

## 2022-08-20 DIAGNOSIS — J449 Chronic obstructive pulmonary disease, unspecified: Secondary | ICD-10-CM

## 2022-08-20 NOTE — Progress Notes (Signed)
Nutrition Follow-up:  Patient with esophageal cancer.  Patient has completed concurrent chemotherapy and radiation.    Patient returned RDs phone call.  Spoke with patient via phone. Says that appetite is pretty good.  Has been drinking 2-3 boost VHC and ensure plus shakes daily.  Says that he just ordered another case of boost VHC.  Has been eating softer foods (soups, ice cream, beanie weanies, macaroni and cheese, eggs, pudding, jello).  Tried a fried liver pudding and egg sandwich and it stayed down.     Labs: reviewed  Anthropometrics:   179 lb on 8/17  178 lb on 6/14  Says on home scale 185 lb  NUTRITION DIAGNOSIS: Inadequate oral intake stable   INTERVENTION:  Continue oral nutrition supplements Continue soft, moist foods high in calories and protein    MONITORING, EVALUATION, GOAL: weight trends, intake   NEXT VISIT: fFriday, Sept 29th phone call  Iolani Twilley B. Zenia Resides, [place], [place] Registered Dietitian [phone]

## 2022-08-20 NOTE — Progress Notes (Signed)
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  Past Medical History    Past Medical History:  Diagnosis Date   Allergic rhinitis    Anxiety    Back pain    Cancer (HCC)    prostate   CHF (congestive heart failure) ([place])    Colon polyps 08/2012   colonoscopy; multiple colon polyps; repeat colonoscopy in one year.   Complication of anesthesia    COPD (chronic obstructive pulmonary disease) ([place])    a. on home O2.   Coronary artery disease 11/2009   a. late presenting ant MI; b. LHC 100% pLAD s/p PCI/DES, 99% mRCA s/p PCI/DES, EF 35%; c. nuclear stress test 05/13: prior ant/inf infarcts w/o ischemia, EF 43%; d. LHC 02/14: widely patent stents with no other obs dz, EF 40%; e. LHC 11/17: LM nl, mLAD 10%, patent LAD stent, dLAD 20%, p-mRCA 10%, mRCA 40%, patent RCA stent, EF 35-45%    Depression    Dysphagia    Family history of adverse reaction to anesthesia    GERD (gastroesophageal reflux disease)    GQQPYPPJ(093.2)    Helicobacter pylori (H. pylori)    Hemorrhoids    Hernia    inguinal   HFrEF (heart failure with reduced ejection fraction) ([place])    a. 10/2016 LV gram: EF 35-45%; b. 06/2018 Echo: EF 40-45%. c. 01/2021 Echo: EF 40-45%   Hyperlipidemia    Hypertension    Insomnia    Ischemic cardiomyopathy    a. 10/2016 LV gram: EF 35-45%; b. 06/2018 Echo: EF 40-45%.   Myalgia    Oxygen dependent    Peptic ulcer    Pulmonary nodules    ST elevation (STEMI) myocardial infarction involving left anterior descending coronary artery ([place]) 11/2009   a. s/p PCI to the  LAD   Status post dilation of esophageal narrowing 2000   Thickening of esophagus    Tinea pedis    Wears dentures    partial upper   Past Surgical History:  Procedure Laterality Date    coronary stents     Admission  12/20/2012   COPD exacerbation.  [place].   CARDIAC CATHETERIZATION     CARDIAC CATHETERIZATION  02/05/2013   [facility]   CARDIAC CATHETERIZATION  09/19/2013   [facility] : patent stents with no change in anatomy. EF: 40$   CARDIAC CATHETERIZATION Left 11/12/2016   Procedure: Left Heart Cath and Coronary Angiography;  Surgeon: Wellington Hampshire, MD;  Location: [place] [facility];  Service: Cardiovascular;  Laterality: Left;   COLONOSCOPY     COLONOSCOPY WITH PROPOFOL N/A 05/18/2016   Procedure: COLONOSCOPY WITH PROPOFOL;  Surgeon: Lucilla Lame, MD;  Location: [facility] ENDOSCOPY;  Service: Endoscopy;  Laterality: N/A;   COLONOSCOPY WITH PROPOFOL N/A 03/19/2020   Procedure: COLONOSCOPY WITH PROPOFOL;  Surgeon: Lin Landsman, MD;  Location: [facility]COPY;  Service: Gastroenterology;  Laterality: N/A;   CORONARY ANGIOPLASTY WITH STENT PLACEMENT  09/19/2009   LAD 3.0 X23 mm Xience DES, RCA: 4.0 X 15 mm Xience DES   ELECTROMAGNETIC NAVIGATION BROCHOSCOPY N/A 10/27/2017   Procedure: ELECTROMAGNETIC NAVIGATION BRONCHOSCOPY;  Surgeon: Flora Lipps, MD;  Location: [facility] OR[facility];  Service: Cardiopulmonary;  Laterality: N/A;   ESOPHAGEAL DILATION     ESOPHAGOGASTRODUODENOSCOPY  12/20/2006   ESOPHAGOGASTRODUODENOSCOPY  08/20/2012   ESOPHAGOGASTRODUODENOSCOPY (EGD) WITH PROPOFOL N/A 04/19/2022   Procedure: ESOPHAGOGASTRODUODENOSCOPY (EGD) WITH PROPOFOL;  Surgeon: [name], MD;  Location: [facility] ENDOSCOPY;  Service: Endoscopy;  Laterality: N/A;   ESOPHAGOGASTRODUODENOSCOPY (EGD) WITH PROPOFOL N/A 04/27/2022   Procedure: ESOPHAGOGASTRODUODENOSCOPY (EGD) WITH PROPOFOL;  Surgeon: [name], MD;  Location: [facility] ENDOSCOPY;  Service: Endoscopy;  Laterality: N/A;   EUS N/A 05/20/2022    Procedure: UPPER ENDOSCOPIC ULTRASOUND (EUS) LINEAR;  Surgeon: Reita Cliche, MD;  Location: [facility] ENDOSCOPY;  Service: Gastroenterology;  Laterality: N/A;  [facility]   HERNIA REPAIR  07/20/2012   L inguinal hernia repair   PENILE PROSTHESIS IMPLANT      Allergies  Allergies  Allergen Reactions   Prozac [Fluoxetine Hcl] Shortness Of Breath   Hydroxyzine    Bupropion Other (See Comments)    "Makes me feel funny" Other reaction(s): Other (See Comments) Unknown/"made me feel real funny" "Makes me feel funny" "Makes me feel funny" Other reaction(s): Other (See Comments) Unknown/"made me feel real funny" "Makes me feel funny"   Effexor Xr [Venlafaxine Hcl Er] Other (See Comments)    "Makes me feel funny"    History of Present Illness    71 year old male with a previous listed medical history of coronary artery disease, ischemic cardiomyopathy, severe COPD, previous tobacco use, anxiety/depression, hyperlipidemia, prostate cancer, ventricular bigeminy.  His history of CAD started with an anterior MI in 2010 with finding of an occluded LAD and 99% stenosis in the RCA.  He had DES placement to the LAD and RCA at that time.  He subsequently underwent repeat heart catheterization in November 2017 which showed patent LAD and RCA stents with an LVEF of 35-40%. Repeat echocardiogram revealed improved EF 40-45%, G2DD, without valvular abnormalities.   History of multiple hospitalizations for COPD.  He quit smoking in 2020.  He did have an admission in July 2023 for COPD exacerbation which she had in her chest pressure that was thought to be from his COPD.  He was last evaluated in clinic 07/28/2022 and continues to report chronic chest pressure in the left side of his chest and shoulder pain. The pain was worsened when he underwent chemotherapy.  He did undergo Myoview stress testing  which reveals no significant ischemia, large region fixed defect mid to distal anterior wall and apical region. EF  estimated at 55%, no EKG changes concerning for ischemia at peak stress or in recovery, moderate risk scan given large fixed anterior wall defect.  He returns to clinic today following up on his coronary artery disease after recently having a Myoview stress testing completed.  There is also just finished chemo and radiation.  He states that he had had significant chest discomfort after radiation but since he is finished his last treatment but discomfort is slowly resolving.  He is currently in a COPD exacerbation on antibiotics and steroids as well.  He does continue to have shortness of breath dyspnea on exertion and a nagging chest discomfort.  He states that the chest discomfort is just a dull ache that is there with rest and exertion without radiation.  He can identify no alleviating or aggravating factors.  He denies any hospitalizations or visits to the emergency department.  He is concerned today as he just had his follow-up with pulmonary and was advised by  the pulmonary clinic that his Myoview showed blockages and he needed to see his cardiologist quickly.  Home Medications    Current Outpatient Medications  Medication Sig Dispense Refill   albuterol (VENTOLIN HFA) 108 (90 Base) MCG/ACT inhaler Inhale 2 puffs into the lungs every 6 (six) hours as needed for wheezing or shortness of breath. 18 g 4   aspirin 81 MG tablet Take 81 mg by mouth daily.     atorvastatin (LIPITOR) 80 MG tablet Take 1 tablet (80 mg total) by mouth daily. 90 tablet 3   budesonide (PULMICORT) 0.5 MG/2ML nebulizer solution Take 2 mLs (0.5 mg total) by nebulization in the morning and at bedtime. 320 mL 12   docusate sodium (COLACE) 100 MG capsule Take 1 capsule (100 mg total) by mouth 2 (two) times daily. 60 capsule 0   esomeprazole (NEXIUM) 40 MG capsule TAKE ONE CAPSULE BY MOUTH ONCE DAILY 30 capsule 4   fluticasone (FLONASE) 50 MCG/ACT nasal spray Place 2 sprays into both nostrils daily.     furosemide (LASIX) 40 MG  tablet TAKE 1 TABLET BY MOUTH EVERY DAY 90 tablet 0   ipratropium-albuterol (DUONEB) 0.5-2.5 (3) MG/3ML SOLN Take 3 mLs by nebulization 2 (two) times daily.     lidocaine-prilocaine (EMLA) cream Apply to affected area once 30 g 3   loratadine (CLARITIN) 10 MG tablet Take 10 mg by mouth daily.     LORazepam (ATIVAN) 1 MG tablet Take 1 tablet (1 mg total) by mouth every 4 (four) hours while awake. (Patient taking differently: Take 1 mg by mouth 3 (three) times daily.) 30 tablet 0   losartan (COZAAR) 25 MG tablet Take 25 mg by mouth daily.     mirtazapine (REMERON) 45 MG tablet Take 45 mg by mouth at bedtime.     Multiple Vitamin (MULTIVITAMIN) capsule Take 1 capsule by mouth daily.     nitroGLYCERIN (NITROSTAT) 0.4 MG SL tablet Place 1 tablet (0.4 mg total) under the tongue every 5 (five) minutes as needed. 25 tablet 3   OXYGEN Inhale 2.5 L into the lungs daily.     polyethylene glycol (MIRALAX) 17 g packet Take 17 g by mouth daily as needed.     prochlorperazine (COMPAZINE) 10 MG tablet Take 1 tablet (10 mg total) by mouth every 6 (six) hours as needed (Nausea or vomiting). 30 tablet 1   Spacer/Aero-Holding Chambers (AEROCHAMBER MV) inhaler Use as instructed 1 each 2   sucralfate (CARAFATE) 1 g tablet Take 1 g by mouth 2 (two) times daily with a meal.     No current facility-administered medications for this visit.     Family History    Family History  Problem Relation Age of Onset   Heart attack Brother        Brother #1   Diabetes Brother    Hypertension Brother        #3   Coronary artery disease Father 53       deceased   Heart attack Father    Diabetes Father    Heart disease Father    COPD Mother 73       deceased   Alcohol abuse Sister        polysubstance abuse   COPD Sister    Lung cancer Sister    Alcohol abuse Sister        polysubstance abuse   Penile cancer Brother    Diabetes Brother    Prostate cancer Neg Hx    Bladder Cancer  Neg Hx    Kidney cancer Neg Hx     He indicated that his mother is deceased. He indicated that his father is deceased. He indicated that both of his sisters are deceased. He indicated that three of his five brothers are alive. He indicated that the status of his neg hx is unknown.  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  Vaping Use   Vaping Use: Never used  Substance and Sexual Activity   Alcohol use: No    Alcohol/week: 0.0 standard drinks of alcohol   Drug use: No   Sexual activity: Yes  Other Topics Concern   Not on file  Social History Narrative   Marital status: married x 32 years; happily married.      Children: 4 children;  1 stepchild and 15 grandchildren; no great grandchildren.      Lives: with wife; 2 dogs, 1 cat.      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      Tobacco: 1 ppd x 45 years      Alcohol: none; previous alcoholism      Drugs: none      Exercise: no formal exercise; physically demanding job; owns horses.      Seatbelts:  100%      Guns: one loaded unsecured gun in the home.      Sunscreen: none   Social Determinants of Radio broadcast assistant Strain: Not on file  Food Insecurity: Not on file  Transportation Needs: Not on file  Physical Activity: Not on file  Stress: Not on file  Social Connections: Not on file  Intimate Partner Violence: Not on file     Review of Systems    General:  No chills, fever, night sweats or weight  changes.  Endorses fatigue Cardiovascular: Endorses chest pain, dyspnea on exertion, denies edema, orthopnea, palpitations, paroxysmal nocturnal dyspnea. Dermatological: No rash, lesions/masses Respiratory: Endorses cough, dyspnea Urologic: No hematuria, dysuria Abdominal:   No nausea, vomiting, diarrhea, bright red blood per rectum, melena, or hematemesis Neurologic:  No visual changes, wkns, changes in mental status. All other systems reviewed and are otherwise negative except as noted above.   Physical Exam    VS:  BP 134/70 (BP Location: Left Arm, Patient Position: Sitting, Cuff Size: Normal)   Pulse (!) 103   Ht '5\' 10"'$  (1.778 m)   Wt 186 lb (84.4 kg)   SpO2 96% Comment: 2L of oxygen  BMI 26.69 kg/m  , BMI Body mass index is 26.69 kg/m.     GEN: Well nourished, well developed, in no acute distress. HEENT: normal. Neck: Supple, no JVD, carotid bruits, or masses. Cardiac: RRR, no murmurs, rubs, or gallops. No clubbing, cyanosis, edema.  Radials/DP/PT 2+ and equal bilaterally.  Respiratory:  Respirations regular and  unlabored, expiratory wheezing noted to auscultation bilaterally.  Respirations are unlabored at rest on 2 L of O2 via nasal cannula GI: Soft, nontender, nondistended, BS + x 4. MS: no deformity or atrophy. Skin: warm and dry, no rash. Neuro:  Strength and sensation are intact. Psych: Normal affect.  Accessory Clinical Findings    ECG personally reviewed by me today-no new tracings were completed today  Lab Results  Component Value Date   WBC 2.8 (L) 08/02/2022   HGB 12.4 (L) 08/02/2022   HCT 39.0 08/02/2022   MCV 93.1 08/02/2022   PLT 164 08/02/2022   Lab Results  Component Value Date   CREATININE 0.84 08/02/2022   BUN 17 08/02/2022   NA 137 08/02/2022   K 3.7 08/02/2022   CL 95 (L) 08/02/2022   CO2 32 08/02/2022   Lab Results  Component Value Date   ALT 24 08/02/2022   AST 20 08/02/2022   ALKPHOS 57 08/02/2022   BILITOT 0.2 (L) 08/02/2022    Lab Results  Component Value Date   CHOL 165 03/18/2021   HDL 69 03/18/2021   LDLCALC 81 03/18/2021   TRIG 77 03/18/2021   CHOLHDL 2.4 03/18/2021    Lab Results  Component Value Date   HGBA1C 6.2 (H) 01/30/2019    Assessment & Plan   1.  Chest discomfort with a history of CAD with remote stenting.  Chest discomfort sounds mostly atypical which is likely from nerve pain exacerbated by chemotherapy and radiation therapy.  His left heart catheterization 2017 showed patent LAD and RCA stents with nonobstructive CAD.  He just recently underwent Myoview stress testing which revealed no significant ischemia, large region fixed defect in the mid to distal anterior wall and apical region.  If he continues to have chest discomfort after his COPD exacerbation can consider a trial of Imdur.  2.  Chronic systolic congestive heart failure with his most recent echocardiogram showing 40-45% which is the same from his previous echo in 2019.  He is euvolemic on exam.  He is unable to tolerate beta-blocker therapy in the past due to his COPD.  He is continued on furosemide 40 mg daily and losartan 25 mg daily.  3.  Severe COPD on chronic oxygen therapy at 2 L via nasal cannula with chronic shortness of breath.  He states he is currently on exacerbation who is on antibiotics and prednisone currently.  This is followed by pulmonary.  4.  Hyperlipidemia with an LDL 67 on 02/2021.  Repeat lipid panel.  Continued on atorvastatin 80 mg daily.  5. Disposition patient to return to clinic after echocardiogram to see MD/APP  Kimaya Whitlatch, NP 08/20/2022, 3:20 PM

## 2022-08-20 NOTE — Patient Instructions (Signed)
Medication Instructions:  Your Physician recommend you continue on your current medication as directed.    *If you need a refill on your cardiac medications before your next appointment, please call your pharmacy*   Lab Work: None ordered today   Testing/Procedures: Your physician has requested that you have an echocardiogram. Echocardiography is a painless test that uses sound waves to create images of your heart. It provides your doctor with information about the size and shape of your heart and how well your heart's chambers and valves are working. This procedure takes approximately one hour. There are no restrictions for this procedure. [facility]    Follow-Up: At [facility], you and your health needs are our priority.  As part of our continuing mission to provide you with exceptional heart care, we have created designated Provider Care Teams.  These Care Teams include your primary Cardiologist (physician) and Advanced Practice Providers (APPs -  Physician Assistants and Nurse Practitioners) who all work together to provide you with the care you need, when you need it.  We recommend signing up for the patient portal called "MyChart".  Sign up information is provided on this After Visit Summary.  MyChart is used to connect with patients for Virtual Visits (Telemedicine).  Patients are able to view lab/test results, encounter notes, upcoming appointments, etc.  Non-urgent messages can be sent to your provider as well.   To learn more about what you can do with MyChart, go to [email or web address].    Your next appointment:   After ECHO  The format for your next appointment:   In Person  Provider:   You will see one of the following Advanced Practice Providers on your designated Care Team:   Murray Hodgkins, NP Christell Faith, PA-C Cadence Kathlen Mody, PA-C Gerrie Nordmann, NP

## 2022-08-29 ENCOUNTER — Other Ambulatory Visit: Payer: Self-pay | Admitting: Oncology

## 2022-08-30 ENCOUNTER — Ambulatory Visit: Payer: Private Health Insurance - Indemnity | Admitting: Medical

## 2022-09-02 ENCOUNTER — Encounter
Admission: RE | Admit: 2022-09-02 | Discharge: 2022-09-02 | Disposition: A | Discharge status: Home / Self Care | Payer: Medicare Other | Source: Ambulatory Visit | Attending: Oncology | Admitting: Oncology

## 2022-09-02 DIAGNOSIS — R918 Other nonspecific abnormal finding of lung field: Secondary | ICD-10-CM

## 2022-09-02 LAB — GLUCOSE, CAPILLARY: Glucose-Capillary: 108 mg/dL — ABNORMAL HIGH (ref 70–99)

## 2022-09-02 MED ORDER — FLUDEOXYGLUCOSE F - 18 (FDG) INJECTION
9.6000 | Freq: Once | INTRAVENOUS | Status: AC | PRN
  Administered 2022-09-02: 10.34 via INTRAVENOUS

## 2022-09-06 ENCOUNTER — Inpatient Hospital Stay (HOSPITAL_BASED_OUTPATIENT_CLINIC_OR_DEPARTMENT_OTHER): Payer: Medicare Other | Admitting: Oncology

## 2022-09-06 ENCOUNTER — Inpatient Hospital Stay: Payer: Medicare Other

## 2022-09-06 ENCOUNTER — Encounter: Payer: Self-pay | Admitting: Oncology

## 2022-09-06 VITALS — BP 146/68 | HR 96 | Temp 98.6°F | Resp 18 | Wt 187.0 lb

## 2022-09-06 DIAGNOSIS — C61 Malignant neoplasm of prostate: Secondary | ICD-10-CM | POA: Diagnosis not present

## 2022-09-06 DIAGNOSIS — R634 Abnormal weight loss: Secondary | ICD-10-CM | POA: Diagnosis not present

## 2022-09-06 DIAGNOSIS — C159 Malignant neoplasm of esophagus, unspecified: Secondary | ICD-10-CM | POA: Diagnosis not present

## 2022-09-06 DIAGNOSIS — C155 Malignant neoplasm of lower third of esophagus: Secondary | ICD-10-CM | POA: Diagnosis not present

## 2022-09-06 DIAGNOSIS — R918 Other nonspecific abnormal finding of lung field: Secondary | ICD-10-CM

## 2022-09-06 LAB — CBC WITH DIFFERENTIAL/PLATELET
Abs Immature Granulocytes: 0.06 10*3/uL (ref 0.00–0.07)
Basophils Absolute: 0 10*3/uL (ref 0.0–0.1)
Basophils Relative: 0 %
Eosinophils Absolute: 0.1 10*3/uL (ref 0.0–0.5)
Eosinophils Relative: 1 %
HCT: 41.4 % (ref 39.0–52.0)
Hemoglobin: 13.4 g/dL (ref 13.0–17.0)
Immature Granulocytes: 1 %
Lymphocytes Relative: 9 %
Lymphs Abs: 0.6 10*3/uL — ABNORMAL LOW (ref 0.7–4.0)
MCH: 30.3 pg (ref 26.0–34.0)
MCHC: 32.4 g/dL (ref 30.0–36.0)
MCV: 93.7 fL (ref 80.0–100.0)
Monocytes Absolute: 1 10*3/uL (ref 0.1–1.0)
Monocytes Relative: 15 %
Neutro Abs: 4.9 10*3/uL (ref 1.7–7.7)
Neutrophils Relative %: 74 %
Platelets: 194 10*3/uL (ref 150–400)
RBC: 4.42 MIL/uL (ref 4.22–5.81)
RDW: 16 % — ABNORMAL HIGH (ref 11.5–15.5)
WBC: 6.7 10*3/uL (ref 4.0–10.5)
nRBC: 0 % (ref 0.0–0.2)

## 2022-09-06 LAB — COMPREHENSIVE METABOLIC PANEL
ALT: 27 U/L (ref 0–44)
AST: 29 U/L (ref 15–41)
Albumin: 4.1 g/dL (ref 3.5–5.0)
Alkaline Phosphatase: 68 U/L (ref 38–126)
Anion gap: 10 (ref 5–15)
BUN: 16 mg/dL (ref 8–23)
CO2: 31 mmol/L (ref 22–32)
Calcium: 9.1 mg/dL (ref 8.9–10.3)
Chloride: 98 mmol/L (ref 98–111)
Creatinine, Ser: 0.78 mg/dL (ref 0.61–1.24)
GFR, Estimated: >60 mL/min (ref >60)
Glucose, Bld: 147 mg/dL — ABNORMAL HIGH (ref 70–99)
Potassium: 3.3 mmol/L — ABNORMAL LOW (ref 3.5–5.1)
Sodium: 139 mmol/L (ref 135–145)
Total Bilirubin: 0.5 mg/dL (ref 0.3–1.2)
Total Protein: 7.2 g/dL (ref 6.5–8.1)

## 2022-09-06 LAB — PSA: Prostatic Specific Antigen: 1.5 ng/mL (ref 0.00–4.00)

## 2022-09-06 NOTE — Assessment & Plan Note (Signed)
He is on concurrent chemotherapy carboplatin/Taxol weekly with radiation. S/p concurrent chemotherapy and radiation [finished 07/26/22] PET restaging showed distal esophageal wall thickening with a single small focus hypermetabolic activity.  Patient is not interested in surgical evaluation. He is likely not candidate for radical esophagectomy. Recommend repeat EGD for evaluation of any persistent disease.  Will discuss with Dr.Locklear

## 2022-09-06 NOTE — Assessment & Plan Note (Signed)
Gleason score 3+4, status post definitive radiation. Continue follow-up with urology Dr. Sninsky.    

## 2022-09-06 NOTE — Progress Notes (Signed)
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 S/p concurrent chemotherapy and radiation [finished 07/26/22] PET restaging showed distal esophageal wall thickening with a single small focus hypermetabolic activity.  Patient is not interested in surgical evaluation. He is likely not candidate for radical esophagectomy. Recommend repeat EGD for evaluation of any persistent disease.  Will discuss with Dr.Locklear   Prostate cancer ([place]) Gleason score 3+4, status post definitive radiation. Continue follow-up with urology Dr. Diamantina Providence.     Weight loss Follow-up with nutritionist.  Recommend nutrition supplementation. Weight is stable.     Orders Placed This Encounter  Procedures   NM PET Image Restage (PS) Skull Base to Thigh (F-18 FDG)    Standing Status:   Future    Standing Expiration Date:   09/07/2023    Order Specific Question:   If indicated for the ordered procedure, I authorize the administration of a radiopharmaceutical per Radiology protocol    Answer:   Yes    Order Specific Question:   Preferred imaging location?    Answer:   [facility]    Order Specific Question:   Radiology Contrast Protocol - do NOT remove file path    Answer:   [email or web address]   CBC with Differential/Platelet    Standing Status:   Future     Standing Expiration Date:   09/07/2023   Comprehensive metabolic panel    Standing Status:   Future    Standing Expiration Date:   09/07/2023    Follow up  3 months.  Repeat PET scan in 3 months and follow-up after.  The patient understands the plans discussed today and is in agreement with them.  He knows to contact our office if he develops concerns prior to his next appointment.   [name], MD      Chief complaints: follow up for esophageal carcinoma treatments.  History of prostate cancer.  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 showed partially obstructive likely malignant esophageal tumor was found at [place] junction.  Biopsied.  Normal stomach and normal examined duodenum. Biopsy pathology showed fragments of the squamous mucosa with high-grade dysplastic.  Scant gastric mucosa present with reactive changes and no dysplastic or significant intestinal metaplastic.  Fragments of fibrillar purulent ulcer diabetes also present.  04/27/2022, repeat EGD and a biopsy of GE junction partially obstructed ulcerating mass. Repeat biopsy pathology showed superficial and disaggregated fragments of squamous mucosa with at least severe dysplastic/squamous cell carcinoma in situ.  Definite invasion cannot be identified.  In part secondary to  specimen fragmentation and tangential sectioning.    04/27/2022 Imaging   MRI abdomen MRCP with and without contrast showed bilateral benign adrenal adenoma.  Small hepatic hemangioma and small right hepatic parenchymal vascular anomalies. Small hiatal hernia.  Colonic  diverticulosis.   05/04/2022 Initial Diagnosis   Primary esophageal squamous cell carcinoma  + Stomach discomfort for 4 months, dysphagia for both solids and liquids at times. + Weight loss   05/04/2022 Cancer Staging   Staging form: Esophagus - Squamous Cell Carcinoma, AJCC 8th Edition - Clinical: Stage II (cT3, cN0, cM0, L: Lower) - Signed by [name], MD on 05/31/2022 Stage prefix: Initial diagnosis Total positive nodes: 0   05/09/2022 Imaging   PET scan showed intense tracer uptake associated with the distal esophageal mass just above the level of the GE junction compatible with primary esophageal neoplasm. No signs of tracer avid nodal metastasis or solid organ metastasis.   05/20/2022 Procedure   EUS by Dr.Spaete.  T3,Nx 05/20/2022, esophagus mass biopsy showed invasive moderately differentiated squamous cell carcinoma, with basaloid features and minimal keratinization.  Background squamous cell carcinoma in situ.   06/03/2022 -  Chemotherapy   Patient is on Treatment Plan :  ESOPHAGUS Carboplatin/PACLitaxel weekly x 6 weeks with XRT       07/09/2022 - 07/12/2022 Hospital Admission   Hospitalization due to COPD exacerbation.     09/03/2022 Imaging   PET restaging  1. Similar symmetric distal esophageal wall thickening with significant reduction in the abnormal FDG avidity, now with a small focus of minimally metabolic activity, nonspecific and possibly reflecting posttreatment change or residual tumor. Suggest continued attention on follow-up imaging. 2. No evidence of hypermetabolic metastatic disease in the chest, abdomen or pelvis. 3. Aortic Atherosclerosis (ICD10-I70.0) and Emphysema (ICD10-J43.9).     Patient has multiple comorbidities including COPD/chronic respiratory failure, history of STEMI/combined systolic and diastolic CHF [last LVEF 40 to 45%], lung nodules previously avid on PET scan, biopsy negative and was felt to be calcified granulomas.-Pulmonology Dr.  Patsey Berthold Patient lives by himself.  Independent with ADL and some of the iADL  Interval History Leslie Sims is here today for repeat clinical assessment. He denies fevers or chills. He denies pain. His appetite is fair His weight is stable. + Esophageal dysphagia, improved.  He has no difficulty with pured food and liquids Weight is stable. + COPD exacerbation - urgent care visit.     Review of Systems  Constitutional:  Negative for appetite change, chills, fatigue, fever and unexpected weight change.  HENT:   Negative for hearing loss and voice change.   Eyes:  Negative for eye problems and icterus.  Respiratory:  Positive for shortness of breath. Negative for chest tightness and cough.        Chronic respiratory failure on oxygen  Cardiovascular:  Negative for leg swelling.  Gastrointestinal:  Negative for abdominal distention, abdominal pain and constipation.       Dysphagia  Endocrine: Negative for hot flashes.  Genitourinary:  Negative for difficulty urinating, dysuria and frequency.   Musculoskeletal:  Negative for arthralgias.  Skin:  Negative for itching and rash.  Neurological:  Negative for light-headedness and numbness.  Hematological:  Negative for adenopathy. Does not bruise/bleed easily.  Psychiatric/Behavioral:  Negative for confusion.      VITALS:  Blood pressure (!) 146/68, pulse 96, temperature 98.6 F (37 C), resp. rate 18, weight 187 lb (84.8 kg), SpO2 97 %, peak flow (!) 3 L/min.  Wt Readings from Last 3 Encounters:  09/06/22  187 lb (84.8 kg)  08/20/22 186 lb (84.4 kg)  08/05/22 179 lb 3.2 oz (81.3 kg)    Body mass index is 26.83 kg/m.  Performance status (ECOG): 2 - Symptomatic, <50% confined to bed  PHYSICAL EXAM:  Physical Exam Constitutional:      General: He is not in acute distress.    Appearance: He is obese. He is not diaphoretic.  HENT:     Head: Normocephalic and atraumatic.     Nose: Nose normal.     Mouth/Throat:     Pharynx: No  oropharyngeal exudate.  Eyes:     General: No scleral icterus.    Pupils: Pupils are equal, round, and reactive to light.  Cardiovascular:     Rate and Rhythm: Normal rate.     Heart sounds: No murmur heard. Pulmonary:     Effort: Pulmonary effort is normal. No respiratory distress.     Comments: Patient is on nasal cannula oxygen Abdominal:     General: There is no distension.     Palpations: Abdomen is soft.  Musculoskeletal:        General: Normal range of motion.     Cervical back: Normal range of motion and neck supple.  Skin:    General: Skin is warm and dry.  Neurological:     Mental Status: He is alert and oriented to person, place, and time.     Motor: No abnormal muscle tone.  Psychiatric:        Mood and Affect: Mood and affect normal.      LABS:      Latest Ref Rng & Units 09/06/2022    1:24 PM 08/02/2022   10:32 AM 07/23/2022    9:03 AM  CBC  WBC 4.0 - 10.5 K/uL 6.7  2.8  4.3   Hemoglobin 13.0 - 17.0 g/dL 13.4  12.4  12.6   Hematocrit 39.0 - 52.0 % 41.4  39.0  39.5   Platelets 150 - 400 K/uL 194  164  142       Latest Ref Rng & Units 09/06/2022    1:24 PM 08/02/2022   10:32 AM 07/23/2022    9:03 AM  CMP  Glucose 70 - 99 mg/dL 147  129  100   BUN 8 - 23 mg/dL '16  17  16   '$ Creatinine 0.61 - 1.24 mg/dL 0.78  0.84  0.84   Sodium 135 - 145 mmol/L 139  137  141   Potassium 3.5 - 5.1 mmol/L 3.3  3.7  3.5   Chloride 98 - 111 mmol/L 98  95  100   CO2 22 - 32 mmol/L 31  32  33   Calcium 8.9 - 10.3 mg/dL 9.1  8.9  9.2   Total Protein 6.5 - 8.1 g/dL 7.2  6.6  6.9   Total Bilirubin 0.3 - 1.2 mg/dL 0.5  0.2  0.2   Alkaline Phos 38 - 126 U/L 68  57  60   AST 15 - 41 U/L '29  20  20   '$ ALT 0 - 44 U/L '27  24  25      '$ STUDIES:  NM PET Image Restag (PS) Skull Base To Thigh  Result Date: 09/03/2022 CLINICAL DATA:  Subsequent treatment strategy for esophageal cancer. EXAM: NUCLEAR MEDICINE PET SKULL BASE TO THIGH TECHNIQUE: 10.34 mCi F-18 FDG was injected intravenously.  Full-ring PET imaging was performed from the skull base to thigh after the radiotracer. CT data was obtained and used  for attenuation correction and anatomic localization. Fasting blood glucose: 108 mg/dl COMPARISON:  PET-CT May 07, 2022 FINDINGS: Mediastinal blood pool activity: SUV max 1.37 Liver activity: SUV max NA NECK: No hypermetabolic cervical adenopathy. Incidental CT findings: None. CHEST: No hypermetabolic thoracic lymph nodes. Hypermetabolic pulmonary nodules or masses. Similar symmetric distal esophageal wall thickening with significant reduction in abnormal FDG avidity now with a single small focus hypermetabolic activity with a max SUV of 1.63 Incidental CT findings: Similar appearance of the partially calcified 9 mm left upper lobe pulmonary nodule which does not demonstrate abnormal FDG avidity on image 77/2. Similar non FDG avid 8 mm nodule within the bandlike area of scarring in the right upper lobe on image 78/2. Moderate emphysema. Aortic atherosclerosis. Coronary artery calcifications. ABDOMEN/PELVIS: No abnormal hypermetabolic activity within the liver, pancreas, adrenal glands, or spleen. No hypermetabolic lymph nodes in the abdomen or pelvis. Incidental CT findings: Stable bilateral adrenal nodules previously characterized as benign adenomas requiring no independent imaging follow-up. Gallbladder is distended without abnormal FDG avidity commonly reflecting fasting state. Penile prosthesis. Colonic diverticulosis without findings of acute diverticulitis. Right-greater-than-left fat containing inguinal hernias. SKELETON: No focal hypermetabolic activity to suggest skeletal metastasis. Incidental CT findings: None. IMPRESSION: 1. Similar symmetric distal esophageal wall thickening with significant reduction in the abnormal FDG avidity, now with a small focus of minimally metabolic activity, nonspecific and possibly reflecting posttreatment change or residual tumor. Suggest continued attention  on follow-up imaging. 2. No evidence of hypermetabolic metastatic disease in the chest, abdomen or pelvis. 3. Aortic Atherosclerosis (ICD10-I70.0) and Emphysema (ICD10-J43.9). Electronically Signed   By: Dahlia Bailiff M.D.   On: 09/03/2022 14:38   NM Myocar Multi W/Spect W/Wall Motion / EF  Result Date: 08/03/2022 Pharmacological myocardial perfusion imaging study with no significant  ischemia Large region fixed defect mid to distal anterior wall and apical region Hypokinesis of distal anterior wall, EF estimated at 55% No EKG changes concerning for ischemia at peak stress or in recovery. CT attenuation correction images with calcification in the proximal LAD Moderate risk scan given large fixed anterior wall defect Signed, Esmond Plants, MD, Ph.D [facility]   DG Chest 2 View  Result Date: 07/21/2022 CLINICAL DATA:  Left anterior chest pain, no reported injury COPD EXAM: CHEST - 2 VIEW; LEFT RIBS - 2 VIEW COMPARISON:  07/09/2022 FINDINGS: The heart size and mediastinal contours are within normal limits. Pulmonary hyperinflation and emphysema. No acute airspace opacity. The visualized skeletal structures are unremarkable. No displaced fracture or other radiographic abnormality of the left ribs. IMPRESSION: 1. Pulmonary hyperinflation and emphysema. No acute airspace opacity. 2. No displaced fracture or other radiographic abnormality of the left ribs. Electronically Signed   By: Delanna Ahmadi M.D.   On: 07/21/2022 13:47   DG Ribs Unilateral Left  Result Date: 07/21/2022 CLINICAL DATA:  Left anterior chest pain, no reported injury COPD EXAM: CHEST - 2 VIEW; LEFT RIBS - 2 VIEW COMPARISON:  07/09/2022 FINDINGS: The heart size and mediastinal contours are within normal limits. Pulmonary hyperinflation and emphysema. No acute airspace opacity. The visualized skeletal structures are unremarkable. No displaced fracture or other radiographic abnormality of the left ribs. IMPRESSION: 1. Pulmonary hyperinflation and  emphysema. No acute airspace opacity. 2. No displaced fracture or other radiographic abnormality of the left ribs. Electronically Signed   By: Delanna Ahmadi M.D.   On: 07/21/2022 13:47   DG Chest Port 1 View  Result Date: 07/09/2022 CLINICAL DATA:  Chest pain, shortness of breath over the  past week EXAM: PORTABLE CHEST 1 VIEW COMPARISON:  03/15/2022, 07/09/2022 FINDINGS: 7 mm right upper lobe pulmonary nodule. Faintly visualized 9 mm left upper lobe pulmonary nodule. Bilateral upper lobe pulmonary nodules were better characterized on the CT of the chest dated 07/09/2022 performed earlier same day. Bilateral mild interstitial thickening. Bilateral emphysematous changes. No focal consolidation. No pleural effusion or pneumothorax. Heart and mediastinal contours are unremarkable. No acute osseous abnormality. IMPRESSION: 1. No acute cardiopulmonary disease. Electronically Signed   By: Kathreen Devoid M.D.   On: 07/09/2022 10:40   CT Angio Chest PE W and/or Wo Contrast  Result Date: 07/09/2022 CLINICAL DATA:  Pulmonary embolism high probability, shortness of breath and chest pain. History of esophageal neoplasm. * Tracking Code: BO * EXAM: CT ANGIOGRAPHY CHEST WITH CONTRAST TECHNIQUE: Multidetector CT imaging of the chest was performed using the standard protocol during bolus administration of intravenous contrast. Multiplanar CT image reconstructions and MIPs were obtained to evaluate the vascular anatomy. RADIATION DOSE REDUCTION: This exam was performed according to the departmental dose-optimization program which includes automated exposure control, adjustment of the mA and/or kV according to patient size and/or use of iterative reconstruction technique. CONTRAST:  78m OMNIPAQUE IOHEXOL 350 MG/ML SOLN COMPARISON:  PET-CT May 07, 2022. FINDINGS: Cardiovascular: Satisfactory opacification of the pulmonary arteries to the segmental level. No evidence of pulmonary embolism. Aortic atherosclerosis. Normal heart  size. No pericardial effusion. Mediastinum/Nodes: No suspicious thyroid nodule. No pathologically enlarged mediastinal, hilar or axillary lymph nodes. Stable calcified 15 mm nodule in the right axilla. Marked distal esophageal wall thickening with patulous gas and fluid filled proximal esophagus similar prior PET-CT. Lungs/Pleura: Stable 9 mm left upper lobe pulmonary nodule which was not hypermetabolic on prior PET-CT. No new suspicious pulmonary nodules or masses. Bilateral calcified granulomata. Stable upper lobe predominant paraseptal and centrilobular emphysematous change with multifocal scarring including a bandlike area in the right upper lobe. Upper Abdomen: No acute abnormality.  Calcified splenic granulomata. Musculoskeletal: No aggressive lytic or blastic lesion of bone. Multilevel degenerative changes spine. Review of the MIP images confirms the above findings. IMPRESSION: 1. No evidence of pulmonary embolus. 2. Similar appearance of the masslike distal esophageal wall thickening consistent with patient's known esophageal carcinoma. 3. Similar patulous appearance of the proximal esophagus with fluid and gas filling the esophagus, almost certainly reflecting a measure of distal esophageal obstruction, quantification of which can not be determined on this examination. 4. No evidence of metastatic disease in the chest. 5. Stable emphysematous change and multifocal pulmonary scarring. 6. Aortic Atherosclerosis (ICD10-I70.0) and Emphysema (ICD10-J43.9). Electronically Signed   By: JDahlia BailiffM.D.   On: 07/09/2022 10:22      Past Medical History:  Diagnosis Date   Allergic rhinitis    Anxiety    Back pain    Cancer (HCC)    prostate   CHF ([place]estive heart failure) ([place]    Colon polyps 08/2012   colonoscopy; multiple colon polyps; repeat colonoscopy in one year.   Complication of anesthesia    COPD (ch[place] obstructive pulmonary disease) [place]    a. on home O2.   Coronary artery disease  11/2009   a. late presenting ant MI; b. LHC 100% pLAD s/p PCI/DES, 99% mRCA s/p PCI/DES, EF 35%; c. nuclear stress test 05/13: prior ant/inf infarcts w/o ischemia, EF 43%; d. LHC 02/14: widely patent stents with no other obs dz, EF 40%; e. LHC 11/17: LM nl, mLAD 10%, patent LAD stent, dLAD 20%, p-mRCA 10%, mRCA 40%, patent RCA  stent, EF 35-45%    Depression    Dysphagia    Family history of adverse reaction to anesthesia    GERD (gastroesophageal reflux disease)    [id number](626.9)    Helicobacter pylori (H. pylori)    Hemorrhoids    Hernia    inguinal   HFrEF (heart failure with reduced ejection fraction) ([place])    a. 10/2016 LV gram: EF 35-45%; b. 06/2018 Echo: EF 40-45%. c. 01/2021 Echo: EF 40-45%   Hyperlipidemia    Hypertension    Insomnia    Ischemic cardiomyopathy    a. 10/2016 LV gram: EF 35-45%; b. 06/2018 Echo: EF 40-45%.   Myalgia    Oxygen dependent    Peptic ulcer    Pulmonary nodules    ST elevation (STEMI) myocardial infarction involving left anterior descending coronary artery ([place]) 11/2009   a. s/p PCI to the LAD   Status post dilation of esophageal narrowing 2000   Thickening of esophagus    Tinea pedis    Wears dentures    partial upper    Past Surgical History:  Procedure Laterality Date    coronary stents     Admission  12/20/2012   COPD exacerbation.  Simms.   CARDIAC CATHETERIZATION     CARDIAC CATHETERIZATION  02/05/2013   [facility]   CARDIAC CATHETERIZATION  09/19/2013   [facility] : patent stents with no change in anatomy. EF: 40$   CARDIAC CATHETERIZATION Left 11/12/2016   Procedure: Left Heart Cath and Coronary Angiography;  Surgeon: Wellington Hampshire, MD;  Location: [facility];  Service: Cardiovascular;  Laterality: Left;   COLONOSCOPY     COLONOSCOPY WITH PROPOFOL N/A 05/18/2016   Procedure: COLONOSCOPY WITH PROPOFOL;  Surgeon: Lucilla Lame, MD;  Location: [facility] ENDOSCOPY;  Service: Endoscopy;  Laterality: N/A;   COLONOSCOPY WITH PROPOFOL N/A  03/19/2020   Procedure: COLONOSCOPY WITH PROPOFOL;  Surgeon: Lin Landsman, MD;  Location[facility] ENDOSCOPY;  Service: Gastroenterology;  Laterality: N/A;   CORONARY ANGIOPLASTY WITH STENT PLACEMENT  09/19/2009   LAD 3.0 X23 mm Xience DES, RCA: 4.0 X 15 mm Xience DES   ELECTROMAGNETIC NAVIGATION BROCHOSCOPY N/A 10/27/2017   Procedure: ELECTROMAGNETIC NAVIGATION BRONCHOSCOPY;  Surgeon: Flora Lipps, MD;  Location: [facility];  Service: Cardiopulmonary;  Laterality: N/A;   ESOPHAGEAL DILATION     ESOPHAGOGASTRODUODENOSCOPY  12/20/2006   ESOPHAGOGASTRODUODENOSCOPY  08/20/2012   ESOPHAGOGASTRODUODENOSCOPY (EGD) WITH PROPOFOL N/A 04/19/2022   Procedure: ESOPHAGOGASTRODUODENOSCOPY (EGD) WITH PROPOFOL;  Surgeon: [name], MD;  Location: [facility] ENDOSCOPY;  Service: Endoscopy;  Laterality: N/A;   ESOPHAGOGASTRODUODENOSCOPY (EGD) WITH PROPOFOL N/A 04/27/2022   Procedure: ESOPHAGOGASTRODUODENOSCOPY (EGD) WITH PROPOFOL;  Surgeon: [name], MD;  Location: [facility] ENDOSCOPY;  Service: Endoscopy;  Laterality: N/A;   EUS N/A 05/20/2022   Procedure: UPPER ENDOSCOPIC ULTRASOUND (EUS) LINEAR;  Surgeon: Reita Cliche, MD;  Location: [facility] ENDOSCOPY;  Service: Gastroenterology;  Laterality: N/A;  [facility]   HERNIA REPAIR  07/20/2012   L inguinal hernia repair   PENILE PROSTHESIS IMPLANT      Family History  Problem Relation Age of Onset   Heart attack Brother        Brother #1   Diabetes Brother    Hypertension Brother        #3   Coronary artery disease Father 33       deceased   Heart attack Father    Diabetes Father    Heart disease Father    COPD Mother 78  deceased   Alcohol abuse Sister        polysubstance abuse   COPD Sister    Lung cancer Sister    Alcohol abuse Sister        polysubstance abuse   Penile cancer Brother    Diabetes Brother    Prostate cancer Neg Hx    Bladder Cancer Neg Hx    Kidney cancer Neg Hx     Social History:  reports that he quit  smoking about 3 years ago. His smoking use included cigarettes. He has a 126.00 pack-year smoking history. He has never used smokeless tobacco. He reports that he does not drink alcohol and does not use drugs.  Allergies:  Allergies  Allergen Reactions   Prozac [Fluoxetine Hcl] Shortness Of Breath   Hydroxyzine    Bupropion Other (See Comments)    "Makes me feel funny" Other reaction(s): Other (See Comments) Unknown/"made me feel real funny" "Makes me feel funny" "Makes me feel funny" Other reaction(s): Other (See Comments) Unknown/"made me feel real funny" "Makes me feel funny"   Effexor Xr [Venlafaxine Hcl Er] Other (See Comments)    "Makes me feel funny"    Current Medications: Current Outpatient Medications  Medication Sig Dispense Refill   albuterol (VENTOLIN HFA) 108 (90 Base) MCG/ACT inhaler Inhale 2 puffs into the lungs every 6 (six) hours as needed for wheezing or shortness of breath. 18 g 4   aspirin 81 MG tablet Take 81 mg by mouth daily.     atorvastatin (LIPITOR) 80 MG tablet Take 1 tablet (80 mg total) by mouth daily. 90 tablet 3   budesonide (PULMICORT) 0.5 MG/2ML nebulizer solution Take 2 mLs (0.5 mg total) by nebulization in the morning and at bedtime. 320 mL 12   docusate sodium (COLACE) 100 MG capsule Take 1 capsule (100 mg total) by mouth 2 (two) times daily. 60 capsule 0   esomeprazole (NEXIUM) 40 MG capsule TAKE ONE CAPSULE BY MOUTH ONCE DAILY 30 capsule 4   fluticasone (FLONASE) 50 MCG/ACT nasal spray Place 2 sprays into both nostrils daily.     furosemide (LASIX) 40 MG tablet TAKE 1 TABLET BY MOUTH EVERY DAY 90 tablet 0   ipratropium-albuterol (DUONEB) 0.5-2.5 (3) MG/3ML SOLN Take 3 mLs by nebulization 2 (two) times daily.     loratadine (CLARITIN) 10 MG tablet Take 10 mg by mouth daily.     LORazepam (ATIVAN) 1 MG tablet Take 1 tablet (1 mg total) by mouth every 4 (four) hours while awake. (Patient taking differently: Take 1 mg by mouth 3 (three) times daily.)  30 tablet 0   losartan (COZAAR) 25 MG tablet Take 25 mg by mouth daily.     mirtazapine (REMERON) 45 MG tablet Take 45 mg by mouth at bedtime.     Multiple Vitamin (MULTIVITAMIN) capsule Take 1 capsule by mouth daily.     OXYGEN Inhale 2.5 L into the lungs daily.     Spacer/Aero-Holding Chambers (AEROCHAMBER MV) inhaler Use as instructed 1 each 2   sucralfate (CARAFATE) 1 g tablet Take 1 g by mouth 2 (two) times daily with a meal.     nitroGLYCERIN (NITROSTAT) 0.4 MG SL tablet Place 1 tablet (0.4 mg total) under the tongue every 5 (five) minutes as needed. (Patient not taking: Reported on 09/06/2022) 25 tablet 3   polyethylene glycol (MIRALAX) 17 g packet Take 17 g by mouth daily as needed.     No current facility-administered medications for this visit.

## 2022-09-06 NOTE — Assessment & Plan Note (Signed)
Follow-up with nutritionist.  Recommend nutrition supplementation. Weight is stable.

## 2022-09-07 ENCOUNTER — Other Ambulatory Visit: Payer: Self-pay | Admitting: Pulmonary Disease

## 2022-09-07 ENCOUNTER — Other Ambulatory Visit: Payer: Self-pay | Admitting: Nurse Practitioner

## 2022-09-07 ENCOUNTER — Telehealth: Payer: Self-pay | Admitting: Pulmonary Disease

## 2022-09-07 DIAGNOSIS — J449 Chronic obstructive pulmonary disease, unspecified: Secondary | ICD-10-CM

## 2022-09-07 NOTE — Telephone Encounter (Signed)
Patient called to ask if he could come by the office tomorrow to get a flutter valve.  He stated he was told that he could request one from pulmonary instead of going to the pharmacy.  Please advise and call patient to let him know if he can pick it up tomorrow.  CB# [phone]

## 2022-09-07 NOTE — Telephone Encounter (Signed)
Spoke with the pt  He states he used to have a flutter valve that the hospital gave him  He lost his, and wants a new one  He feels that this helped with his cough and SOB  I spoke with Dr Darnell Level about this and she states okay to write order to send to DME Pt uses [facility]  Referral was placed  Nothing further needed

## 2022-09-07 NOTE — Telephone Encounter (Signed)
This is a [facility] pt 

## 2022-09-08 ENCOUNTER — Ambulatory Visit
Admission: RE | Admit: 2022-09-08 | Discharge: 2022-09-08 | Disposition: A | Discharge status: Home / Self Care | Payer: Medicare Other | Source: Ambulatory Visit | Attending: Radiation Oncology | Admitting: Radiation Oncology

## 2022-09-08 VITALS — BP 159/75 | HR 100 | Resp 18 | Wt 186.0 lb

## 2022-09-08 DIAGNOSIS — C155 Malignant neoplasm of lower third of esophagus: Secondary | ICD-10-CM | POA: Insufficient documentation

## 2022-09-08 NOTE — Progress Notes (Signed)
Radiation Oncology Follow up Note  Name: Leslie Sims [facility]   Date:   09/08/2022 MRN:  [id number] DOB: 1951/02/11    This 71 y.o. male presents to the clinic today for 1 month follow-up status post radiation therapy for distal esophageal squamous cell carcinoma  REFERRING PROVIDER: [name], MD  HPI: Patient is a 71 year old male now at 1 month having completed radiation therapy with concurrent chemotherapy for squamous cell carcinoma the distal esophagus.  Seen today in routine follow-up he is having some discomfort in his left chest he has a cardiogram performed tomorrow I believe by his cardiologist.  He is swallowing well..  He had a recent PET scan showing symmetric distal esophageal wall thickening with significant reproduction of the abnormal hypermetabolic activity.  No other evidence of hypermetabolic disease in his chest abdomen or pelvis.  COMPLICATIONS OF TREATMENT: none  FOLLOW UP COMPLIANCE: keeps appointments   PHYSICAL EXAM:  BP (!) 159/75 (BP Location: Left Arm, Patient Position: Sitting)   Pulse 100   Resp 18   Wt 186 lb (84.4 kg)   SpO2 100% Comment: on 3L of oxygen  BMI 26.69 kg/m  Nasal oxygen dependent male in NAD.  Well-developed well-nourished patient in NAD. HEENT reveals PERLA, EOMI, discs not visualized.  Oral cavity is clear. No oral mucosal lesions are identified. Neck is clear without evidence of cervical or supraclavicular adenopathy. Lungs are clear to A&P. Cardiac examination is essentially unremarkable with regular rate and rhythm without murmur rub or thrill. Abdomen is benign with no organomegaly or masses noted. Motor sensory and DTR levels are equal and symmetric in the upper and lower extremities. Cranial nerves II through XII are grossly intact. Proprioception is intact. No peripheral adenopathy or edema is identified. No motor or sensory levels are noted. Crude visual fields are within normal range.  RADIOLOGY RESULTS: PET scan reviewed  compatible with above-stated findings  PLAN: The present time patient is doing well from esophageal standpoint he can advance his diet past soft foods incrementally.  He is also being scheduled for an upper endoscopy to evaluate response.  I have asked to see him back in 3 to 4 months for follow-up.  He will also follow-up with cardiology about his chest pain.  Patient is to call with any concerns.  I would like to take this opportunity to thank you for allowing me to participate in the care of your patient.Noreene Filbert, MD

## 2022-09-09 ENCOUNTER — Ambulatory Visit: Payer: Medicare Other | Attending: Cardiology

## 2022-09-09 DIAGNOSIS — Z09 Encounter for follow-up examination after completed treatment for conditions other than malignant neoplasm: Secondary | ICD-10-CM | POA: Insufficient documentation

## 2022-09-09 DIAGNOSIS — R0602 Shortness of breath: Secondary | ICD-10-CM | POA: Diagnosis present

## 2022-09-09 LAB — ECHOCARDIOGRAM COMPLETE
AV Mean grad: 2 mmHg
AV Peak grad: 6 mmHg
Ao pk vel: 1.22 m/s
Area-P 1/2: 5.84 cm2

## 2022-09-09 MED ORDER — PERFLUTREN LIPID MICROSPHERE
1.0000 mL | INTRAVENOUS | Status: AC | PRN
  Administered 2022-09-09: 2 mL via INTRAVENOUS

## 2022-09-13 ENCOUNTER — Telehealth: Payer: Self-pay | Admitting: *Deleted

## 2022-09-13 NOTE — Telephone Encounter (Signed)
Left voicemail message to call back for review of results.  

## 2022-09-13 NOTE — Telephone Encounter (Signed)
-----   Message from Gerrie Nordmann, NP sent at 09/10/2022  9:14 PM EDT ----- Please let Mr. Simkin know that his heart function is 50-55%, which is still considered low normal, he has a little stiffening of the muscle, no valve abnormalities were noted. Improving blood pressure with help with these findings. Continue current medication regimen.

## 2022-09-15 NOTE — Telephone Encounter (Signed)
Reviewed results and confirmed upcoming appointment. He verbalized understanding with no further questions at this time.

## 2022-09-17 ENCOUNTER — Telehealth: Payer: Self-pay | Admitting: *Deleted

## 2022-09-17 ENCOUNTER — Inpatient Hospital Stay: Payer: Medicare Other

## 2022-09-17 ENCOUNTER — Other Ambulatory Visit: Payer: Self-pay | Admitting: Nurse Practitioner

## 2022-09-17 NOTE — Progress Notes (Signed)
Nutrition Follow-up:  Patient with esophageal cancer.  Patient has completed concurrent chemotherapy and radiation.    Spoke with patient via phone for follow-up.  Patient says that he just ate 1 whole tuna salad sandwich and and another half plus a nutty buddy bar.  Last night ate chicken noodle soup.  Drinking 2 shakes a day. Cooked pot roast recently and was able to tolerate.    Noted planning to GI for possible EGD    Medications: reviewed  Labs: reviewed  Anthropometrics:   Weight 186 lb on 9/20 179 lb on 8/17 178 lb on 6/14   NUTRITION DIAGNOSIS: Inadequate oral intake improved with weight gain   INTERVENTION:  Continue oral nutrition supplements Continue soft moist protein foods Patient to contact RD if needed in the future.      MONITORING, EVALUATION, GOAL: weight trends, intake   NEXT VISIT: no follow-up RD available if needed in the future  Debralee Braaksma B. Zenia Resides, [place], [place] Registered Dietitian [phone]

## 2022-09-17 NOTE — Telephone Encounter (Signed)
Per care everywhere, Dr. Drinda Butts office has tried to contact pt but attempts have been unsuccessful. He needs to contact his office to set up and appt.

## 2022-09-17 NOTE — Telephone Encounter (Signed)
Call returned to patient and explained to him that Dr Helane Rima office has been trying to reach him. He states he does not have any message on his answering machine and asked for the number to call them I provided him the [place] office number

## 2022-09-17 NOTE — Telephone Encounter (Signed)
Patient called reporting that he has not heard from GI Dr Haig Prophet regarding a scope that he needs to have done. He states he has been off treatment for 2 weeks now. Please advise

## 2022-09-22 ENCOUNTER — Other Ambulatory Visit: Payer: Self-pay | Admitting: Pulmonary Disease

## 2022-09-24 ENCOUNTER — Other Ambulatory Visit: Payer: Private Health Insurance - Indemnity

## 2022-09-28 ENCOUNTER — Encounter: Payer: Self-pay | Admitting: Cardiology

## 2022-09-28 ENCOUNTER — Ambulatory Visit: Payer: Medicare Other | Attending: Cardiology | Admitting: Cardiology

## 2022-09-28 VITALS — BP 120/70 | HR 84 | Ht 70.0 in | Wt 191.2 lb

## 2022-09-28 DIAGNOSIS — I25118 Atherosclerotic heart disease of native coronary artery with other forms of angina pectoris: Secondary | ICD-10-CM | POA: Diagnosis present

## 2022-09-28 DIAGNOSIS — E782 Mixed hyperlipidemia: Secondary | ICD-10-CM | POA: Insufficient documentation

## 2022-09-28 DIAGNOSIS — J449 Chronic obstructive pulmonary disease, unspecified: Secondary | ICD-10-CM | POA: Insufficient documentation

## 2022-09-28 DIAGNOSIS — R0602 Shortness of breath: Secondary | ICD-10-CM | POA: Diagnosis not present

## 2022-09-28 DIAGNOSIS — R072 Precordial pain: Secondary | ICD-10-CM | POA: Insufficient documentation

## 2022-09-28 DIAGNOSIS — I5022 Chronic systolic (congestive) heart failure: Secondary | ICD-10-CM | POA: Diagnosis present

## 2022-09-28 DIAGNOSIS — I10 Essential (primary) hypertension: Secondary | ICD-10-CM | POA: Diagnosis present

## 2022-09-28 MED ORDER — ISOSORBIDE MONONITRATE ER 30 MG PO TB24: 30.0000 mg | ORAL_TABLET | Freq: Every day | ORAL | 2 refills | Status: DC

## 2022-09-28 MED ORDER — FUROSEMIDE 20 MG PO TABS: 20.0000 mg | ORAL_TABLET | Freq: Every day | ORAL | 2 refills | Status: DC

## 2022-09-28 NOTE — Patient Instructions (Signed)
Medication Instructions:   DECREASE Lasix - taker one tablet (20 mg) by mouth daily.   - if you have a lot of the '40mg'$  tablets left you may cut those in half.   2. START Imdur - take one tablet (30 mg) by mouth daily.   *If you need a refill on your cardiac medications before your next appointment, please call your pharmacy*   Lab Work:  None Ordered  If you have labs (blood work) drawn today and your tests are completely normal, you will receive your results only by: [place] (if you have MyChart) OR A paper copy in the mail If you have any lab test that is abnormal or we need to change your treatment, we will call you to review the results.   Testing/Procedures:  None Ordered   Follow-Up: At [facility], you and your health needs are our priority.  As part of our continuing mission to provide you with exceptional heart care, we have created designated Provider Care Teams.  These Care Teams include your primary Cardiologist (physician) and Advanced Practice Providers (APPs -  Physician Assistants and Nurse Practitioners) who all work together to provide you with the care you need, when you need it.  We recommend signing up for the patient portal called "MyChart".  Sign up information is provided on this After Visit Summary.  MyChart is used to connect with patients for Virtual Visits (Telemedicine).  Patients are able to view lab/test results, encounter notes, upcoming appointments, etc.  Non-urgent messages can be sent to your provider as well.   To learn more about what you can do with MyChart, go to [email or web address].    Your next appointment:   2 week(s)  The format for your next appointment:   In Person  Provider:   You may see Kathlyn Sacramento, MD or one of the following Advanced Practice Providers on your designated Care Team:   Murray Hodgkins, NP Christell Faith, PA-C Cadence Kathlen Mody, PA-C Gerrie Nordmann, NP

## 2022-09-28 NOTE — Progress Notes (Signed)
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  Past Medical History    Past Medical History:  Diagnosis Date   Allergic rhinitis    Anxiety    Back pain    Cancer (HCC)    prostate   CHF (congestive heart failure) ([place])    Colon polyps 08/2012   colonoscopy; multiple colon polyps; repeat colonoscopy in one year.   Complication of anesthesia    COPD (chronic obstructive pulmonary disease) ([place])    a. on home O2.   Coronary artery disease 11/2009   a. late presenting ant MI; b. LHC 100% pLAD s/p PCI/DES, 99% mRCA s/p PCI/DES, EF 35%; c. nuclear stress test 05/13: prior ant/inf infarcts w/o ischemia, EF 43%; d. LHC 02/14: widely patent stents with no other obs dz, EF 40%; e. LHC 11/17: LM nl, mLAD 10%, patent LAD stent, dLAD 20%, p-mRCA 10%, mRCA 40%, patent RCA stent, EF 35-45%    Depression    Dysphagia    Family history of adverse reaction to anesthesia    GERD (gastroesophageal reflux disease)    XVQMGQQP(619.5)    Helicobacter pylori (H. pylori)    Hemorrhoids    Hernia    inguinal   HFrEF (heart failure with reduced ejection fraction) ([place])    a. 10/2016 LV gram: EF 35-45%; b. 06/2018 Echo: EF 40-45%. c. 01/2021 Echo: EF 40-45%   Hyperlipidemia    Hypertension    Insomnia    Ischemic cardiomyopathy    a. 10/2016 LV gram: EF 35-45%; b. 06/2018 Echo: EF 40-45%.   Myalgia    Oxygen dependent    Peptic ulcer    Pulmonary nodules    ST elevation (STEMI) myocardial infarction involving left anterior descending coronary artery ([place]) 11/2009   a. s/p PCI to the  LAD   Status post dilation of esophageal narrowing 2000   Thickening of esophagus    Tinea pedis    Wears dentures    partial upper   Past Surgical History:  Procedure Laterality Date    coronary stents     Admission  12/20/2012   COPD exacerbation.  [place].   CARDIAC CATHETERIZATION     CARDIAC CATHETERIZATION  02/05/2013   [facility]   CARDIAC CATHETERIZATION  09/19/2013   [facility] : patent stents with no change in anatomy. EF: 40$   CARDIAC CATHETERIZATION Left 11/12/2016   Procedure: Left Heart Cath and Coronary Angiography;  Surgeon: Wellington Hampshire, MD;  Location: [facility];  Service: Cardiovascular;  Laterality: Left;   COLONOSCOPY     COLONOSCOPY WITH PROPOFOL N/A 05/18/2016   Procedure: COLONOSCOPY WITH PROPOFOL;  Surgeon: Lucilla Lame, MD;  Location: [facility] ENDOSCOPY;  Service: Endoscopy;  Laterality: N/A;   COLONOSCOPY WITH PROPOFOL N/A 03/19/2020   Procedure: COLONOSCOPY WITH PROPOFOL;  Surgeon: Lin Landsman, MD;  Location[facility]COPY;  Service: Gastroenterology;  Laterality: N/A;   CORONARY ANGIOPLASTY WITH STENT PLACEMENT  09/19/2009   LAD 3.0 X23 mm Xience DES, RCA: 4.0 X 15 mm Xience DES   ELECTROMAGNETIC NAVIGATION BROCHOSCOPY N/A 10/27/2017   Procedure: ELECTROMAGNETIC NAVIGATION BRONCHOSCOPY;  Surgeon:  Flora Lipps, MD;  Location: [facility] ORS;  Service: Cardiopulmonary;  Laterality: N/A;   ESOPHAGEAL DILATION     ESOPHAGOGASTRODUODENOSCOPY  12/20/2006   ESOPHAGOGASTRODUODENOSCOPY  08/20/2012   ESOPHAGOGASTRODUODENOSCOPY (EGD) WITH PROPOFOL N/A 04/19/2022   Procedure: ESOPHAGOGASTRODUODENOSCOPY (EGD) WITH PROPOFOL;  Surgeon: [name], MD;  Location: [facility] ENDOSCOPY;  Service: Endoscopy;  Laterality: N/A;   ESOPHAGOGASTRODUODENOSCOPY (EGD) WITH PROPOFOL N/A 04/27/2022   Procedure: ESOPHAGOGASTRODUODENOSCOPY (EGD) WITH PROPOFOL;  Surgeon: [name], MD;  Location: [facility] ENDOSCOPY;  Service: Endoscopy;  Laterality: N/A;   EUS N/A 05/20/2022    Procedure: UPPER ENDOSCOPIC ULTRASOUND (EUS) LINEAR;  Surgeon: Reita Cliche, MD;  Location: [facility] ENDOSCOPY;  Service: Gastroenterology;  Laterality: N/A;  [facility]   HERNIA REPAIR  07/20/2012   L inguinal hernia repair   PENILE PROSTHESIS IMPLANT      Allergies  Allergies  Allergen Reactions   Prozac [Fluoxetine Hcl] Shortness Of Breath   Hydroxyzine    Bupropion Other (See Comments)    "Makes me feel funny" Other reaction(s): Other (See Comments) Unknown/"made me feel real funny" "Makes me feel funny" "Makes me feel funny" Other reaction(s): Other (See Comments) Unknown/"made me feel real funny" "Makes me feel funny"   Effexor Xr [Venlafaxine Hcl Er] Other (See Comments)    "Makes me feel funny"    History of Present Illness    Leslie Sims is a 71 year old male with a previous listed medical history of coronary artery disease, ischemic cardiomyopathy, severe COPD, previous tobacco use, anxiety/depression, hyperlipidemia, prostate cancer, and ventricular bigeminy.  He has history of CAD started with an anterior MI in 2018 at with finding of occluded LAD and 90% stenosis in the RCA.  He had DES placed to LAD and RCA at that time.  He subsequently underwent repeat heart catheterization in November 2017 which showed patent LAD and RCA stents with an LVEF of 35-40%.  Repeat echocardiogram revealed improved EF 40-45%, G2 DD, without valvular abnormalities.  History of multiple hospitalizations for COPD quit smoking in 2020.  He did have an admission in July 2023 for COPD exacerbation which he had chest pressure that was thought to be from his COPD at that time.  During his clinic evaluation of 07/28/2022 he continued to report chronic chest pressure on the left side of his left chest and shoulder pain.  The pains worsen when he underwent chemotherapy.  He did undergo Myoview stress testing which revealed no significant ischemia, large region fixed fixed defect mid to distal anterior  wall and apical region.  EF estimated 55% no EKG changes concerning for ischemia at peak stress or recovery, moderate risk scan given the large fixed anterior wall defect.  Was last seen in clinic 08/20/2022 after having a stress testing completed.  He is also just finished chemo and radiation.  He had a significant chest discomfort after radiation but since he finished with his last treatment his discomfort was slowly resolving.  He was currently being treated for an exacerbation of his COPD on antibiotics and prednisone during his visits and there were no medication changes that were made at that time.  He returns to clinic today still complaining of left upper chest left shoulder discomfort that is unchanged from his previous visit.  It has gradually decreased since he had finished chemotherapy and radiation but is still noticeable.  He also notes that he is currently on antibiotics again for issues with his COPD and remains on 2 L of O2 via nasal cannula.  He denies any current palpitations or peripheral edema.  He denies any recent hospitalizations or visits to the emergency department.    Home Medications    Current Outpatient Medications  Medication Sig Dispense Refill   albuterol (VENTOLIN HFA) 108 (90 Base) MCG/ACT inhaler Inhale 2 puffs into the lungs every 6 (six) hours as needed for wheezing or shortness of breath. 18 g 4   aspirin 81 MG tablet Take 81 mg by mouth daily.     atorvastatin (LIPITOR) 80 MG tablet Take 1 tablet (80 mg total) by mouth daily. 90 tablet 3   azithromycin (ZITHROMAX) 250 MG tablet TAKE 1 TABLET BY MOUTH THREE TIMES A WEEK 12 tablet 4   budesonide (PULMICORT) 0.5 MG/2ML nebulizer solution Take 2 mLs (0.5 mg total) by nebulization in the morning and at bedtime. 320 mL 12   docusate sodium (COLACE) 100 MG capsule Take 1 capsule (100 mg total) by mouth 2 (two) times daily. 60 capsule 0   esomeprazole (NEXIUM) 40 MG capsule TAKE ONE CAPSULE BY MOUTH ONCE DAILY 30  capsule 4   fluticasone (FLONASE) 50 MCG/ACT nasal spray Place 2 sprays into both nostrils daily.     furosemide (LASIX) 40 MG tablet TAKE 1 TABLET BY MOUTH EVERY DAY 90 tablet 0   ipratropium-albuterol (DUONEB) 0.5-2.5 (3) MG/3ML SOLN Take 3 mLs by nebulization 2 (two) times daily.     ketoconazole (NIZORAL) 2 % cream Apply topically 2 (two) times daily.     loratadine (CLARITIN) 10 MG tablet Take 10 mg by mouth daily.     LORazepam (ATIVAN) 1 MG tablet Take 1 tablet (1 mg total) by mouth every 4 (four) hours while awake. (Patient taking differently: Take 1 mg by mouth 3 (three) times daily.) 30 tablet 0   losartan (COZAAR) 25 MG tablet Take 25 mg by mouth daily.     mirtazapine (REMERON) 45 MG tablet Take 45 mg by mouth at bedtime.     Multiple Vitamin (MULTIVITAMIN) capsule Take 1 capsule by mouth daily.     OXYGEN Inhale 2.5 L into the lungs daily.     polyethylene glycol (MIRALAX) 17 g packet Take 17 g by mouth daily as needed.     Spacer/Aero-Holding Chambers (AEROCHAMBER MV) inhaler Use as instructed 1 each 2   sucralfate (CARAFATE) 1 g tablet Take 1 g by mouth 2 (two) times daily with a meal.     nitroGLYCERIN (NITROSTAT) 0.4 MG SL tablet Place 1 tablet (0.4 mg total) under the tongue every 5 (five) minutes as needed. (Patient not taking: Reported on 09/06/2022) 25 tablet 3   No current facility-administered medications for this visit.     Family History    Family History  Problem Relation Age of Onset   Heart attack Brother        Brother #1   Diabetes Brother    Hypertension Brother        #3   Coronary artery disease Father 77       deceased   Heart attack Father    Diabetes Father    Heart disease Father    COPD Mother 53       deceased   Alcohol abuse Sister        polysubstance abuse   COPD Sister    Lung cancer Sister    Alcohol abuse Sister        polysubstance abuse   Penile cancer Brother    Diabetes Brother    Prostate cancer Neg  Hx    Bladder Cancer Neg  Hx    Kidney cancer Neg Hx    He indicated that his mother is deceased. He indicated that his father is deceased. He indicated that both of his sisters are deceased. He indicated that three of his five brothers are alive. He indicated that the status of his neg hx is unknown.  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  Vaping Use   Vaping Use: Never used  Substance and Sexual Activity   Alcohol use: No    Alcohol/week: 0.0 standard drinks of alcohol   Drug use: No   Sexual activity: Yes  Other Topics Concern   Not on file  Social History Narrative   Marital status: married x 32 years; happily married.      Children: 4 children;  1 stepchild and 15 grandchildren; no great grandchildren.      Lives: with wife; 2 dogs, 1 cat.      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      Tobacco: 1 ppd x 45 years      Alcohol: none; previous alcoholism      Drugs: none      Exercise: no formal exercise; physically demanding job; owns horses.      Seatbelts:  100%      Guns: one loaded unsecured gun in the home.      Sunscreen: none   Social Determinants of Radio broadcast assistant Strain: Not on file  Food Insecurity: Not on file  Transportation Needs: Not on file  Physical Activity: Not on file  Stress: Not on file  Social Connections: Not on file  Intimate Partner Violence: Not on file     Review of Systems    General:  No chills,  fever, night sweats or weight changes.  Endorses fatigue Cardiovascular: Endorses chest pain, dyspnea on exertion, but denies loss edema, orthopnea, palpitations, paroxysmal nocturnal dyspnea. Dermatological: No rash, lesions/masses Respiratory: Endorses cough, dyspnea Urologic: No hematuria, dysuria Abdominal:   No nausea, vomiting, diarrhea, bright red blood per rectum, melena, or hematemesis Neurologic:  No visual changes, wkns, changes in mental status. All other systems reviewed and are otherwise negative except as noted above.   Physical Exam    VS:  BP 120/70 (BP Location: Left Arm, Patient Position: Sitting, Cuff Size: Normal)   Pulse 84   Ht '5\' 10"'$  (1.778 m)   Wt 191 lb 3.2 oz (86.7 kg)   SpO2 98%   BMI 27.43 kg/m  , BMI Body mass index is 27.43 kg/m.     GEN: Well nourished, well developed, in no acute distress. HEENT: normal. Neck: Supple, no JVD, carotid bruits, or masses. Cardiac: RRR, no murmurs, rubs, or gallops. No clubbing, cyanosis, edema.  Radials/DP/PT 2+ and equal bilaterally.  Respiratory:  Respirations regular and unlabored, expiratory wheezes to auscultation bilaterally.  Rations are unlabored at rest on O2 2 L via nasal cannula GI: Soft, nontender, nondistended, BS + x 4. MS: no deformity or atrophy. Skin: warm and dry, no rash. Neuro:  Strength and sensation are intact. Psych: Normal affect.  Accessory Clinical Findings    ECG personally reviewed by me today-no new tracings were completed today  Lab Results  Component Value Date   WBC 6.7 09/06/2022   HGB 13.4 09/06/2022   HCT 41.4 09/06/2022   MCV 93.7 09/06/2022   PLT 194 09/06/2022   Lab Results  Component Value Date   CREATININE 0.78 09/06/2022   BUN 16 09/06/2022   NA 139 09/06/2022   K 3.3 (L) 09/06/2022   CL 98 09/06/2022   CO2 31 09/06/2022   Lab Results  Component Value Date   ALT 27 09/06/2022   AST 29 09/06/2022   ALKPHOS 68 09/06/2022   BILITOT 0.5 09/06/2022   Lab  Results  Component Value Date   CHOL 165 03/18/2021   HDL 69 03/18/2021   LDLCALC 81 03/18/2021   TRIG 77 03/18/2021   CHOLHDL 2.4 03/18/2021    Lab Results  Component Value Date   HGBA1C 6.2 (H) 01/30/2019    Assessment & Plan   1.  Chest discomfort with a history of CAD with remote stenting.  His he did have a DES placed to the LAD and RCA.  Chest discomfort sounds mostly atypical which is likely from nerve pain exacerbated by his chemotherapy and radiation therapy it is reproducible.  His last left heart catheterization in 2017 showed patent LAD and RCA stents and nonobstructive CAD.  He just recently underwent Myoview stress testing which revealed no significant ischemia.  Since his chest discomfort has continued we will start him on Imdur 30 mg daily.  2.  HFrEF with a last echocardiogram revealed LVEF of 40-45%.  He is euvolemic on exam.  He was unable to tolerate beta-blocker therapy in the past due to his COPD.  He is continued on furosemide which is decreased to 20 mg daily today after starting Imdur as well as being euvolemic.  He previously been on losartan 25 mg daily but currently is unsure whether he is still taking that medication.  3.  Severe COPD on chronic oxygen therapy 2 L via nasal cannula chronic shortness of breath and acute exacerbation currently on chronic therapy.  He continues to be followed by pulmonary.  4.  Hypertension with blood pressure today of 120/70 continues to remain stable.  There are some question today whether he is taking his losartan 25 mg daily, he is to reevaluate his medications and I will call the office back if he is no longer taking so he can be removed from his current medication list.  He is also been encouraged to monitor his pressures at home.  5.  Hyperlipidemia with LDL 67 in 02/2021 on return we will need repeat a lipid panel.  He has been continued on atorvastatin 80 mg daily.  6.  Disposition he is return to clinic to see MD/APP in 2  weeks after being started on Antianginals for his complaints of chest discomfort and. Oluwatobiloba Martin, NP 09/28/2022, 3:17 PM

## 2022-10-02 ENCOUNTER — Other Ambulatory Visit: Payer: Self-pay | Admitting: Pulmonary Disease

## 2022-10-11 ENCOUNTER — Emergency Department
Admission: EM | Admit: 2022-10-11 | Discharge: 2022-10-11 | Disposition: A | Discharge status: Home / Self Care | Payer: Medicare Other | Attending: Emergency Medicine | Admitting: Emergency Medicine

## 2022-10-11 ENCOUNTER — Emergency Department: Payer: Medicare Other

## 2022-10-11 ENCOUNTER — Other Ambulatory Visit: Payer: Self-pay

## 2022-10-11 DIAGNOSIS — J441 Chronic obstructive pulmonary disease with (acute) exacerbation: Secondary | ICD-10-CM

## 2022-10-11 DIAGNOSIS — Z8501 Personal history of malignant neoplasm of esophagus: Secondary | ICD-10-CM | POA: Diagnosis not present

## 2022-10-11 DIAGNOSIS — R0602 Shortness of breath: Secondary | ICD-10-CM | POA: Diagnosis present

## 2022-10-11 DIAGNOSIS — J449 Chronic obstructive pulmonary disease, unspecified: Secondary | ICD-10-CM | POA: Diagnosis not present

## 2022-10-11 LAB — CBC
HCT: 40 % (ref 39.0–52.0)
Hemoglobin: 12.5 g/dL — ABNORMAL LOW (ref 13.0–17.0)
MCH: 30 pg (ref 26.0–34.0)
MCHC: 31.3 g/dL (ref 30.0–36.0)
MCV: 95.9 fL (ref 80.0–100.0)
Platelets: 190 10*3/uL (ref 150–400)
RBC: 4.17 MIL/uL — ABNORMAL LOW (ref 4.22–5.81)
RDW: 14 % (ref 11.5–15.5)
WBC: 6.8 10*3/uL (ref 4.0–10.5)
nRBC: 0 % (ref 0.0–0.2)

## 2022-10-11 LAB — BASIC METABOLIC PANEL
Anion gap: 9 (ref 5–15)
BUN: 19 mg/dL (ref 8–23)
CO2: 28 mmol/L (ref 22–32)
Calcium: 9.2 mg/dL (ref 8.9–10.3)
Chloride: 103 mmol/L (ref 98–111)
Creatinine, Ser: 0.76 mg/dL (ref 0.61–1.24)
GFR, Estimated: >60 mL/min (ref >60)
Glucose, Bld: 143 mg/dL — ABNORMAL HIGH (ref 70–99)
Potassium: 3.9 mmol/L (ref 3.5–5.1)
Sodium: 140 mmol/L (ref 135–145)

## 2022-10-11 MED ORDER — PREDNISONE 50 MG PO TABS: 50.0000 mg | ORAL_TABLET | Freq: Every day | ORAL | 0 refills | Status: DC

## 2022-10-11 MED ORDER — ALBUTEROL SULFATE HFA 108 (90 BASE) MCG/ACT IN AERS: 2.0000 | INHALATION_SPRAY | RESPIRATORY_TRACT | Status: DC | PRN

## 2022-10-11 MED ORDER — IPRATROPIUM-ALBUTEROL 0.5-2.5 (3) MG/3ML IN SOLN
3.0000 mL | Freq: Once | RESPIRATORY_TRACT | Status: AC
  Administered 2022-10-11: 3 mL via RESPIRATORY_TRACT
  Filled 2022-10-11: qty 3

## 2022-10-11 MED ORDER — MAGNESIUM SULFATE 2 GM/50ML IV SOLN
2.0000 g | Freq: Once | INTRAVENOUS | Status: AC
  Administered 2022-10-11: 2 g via INTRAVENOUS
  Filled 2022-10-11: qty 50

## 2022-10-11 MED ORDER — METHYLPREDNISOLONE SODIUM SUCC 125 MG IJ SOLR
125.0000 mg | Freq: Once | INTRAMUSCULAR | Status: AC
  Administered 2022-10-11: 125 mg via INTRAVENOUS
  Filled 2022-10-11: qty 2

## 2022-10-11 NOTE — ED Triage Notes (Signed)
See note for 10/11/2022 at 1214.

## 2022-10-11 NOTE — ED Provider Notes (Signed)
[facility] Provider Note    Event Date/Time   First MD Initiated Contact with Patient 10/11/22 1326     (approximate)   History   Shortness of Breath   HPI  Leslie Sims is a 71 y.o. male with a history of esophageal cancer, COPD who presents with complaints of shortness of breath.  Patient is on 2 to 3 L home oxygen.  He reports worsening breathing over the last 2 weeks, trialed prednisone and antibiotics with some improvement but symptoms have worsened again     Physical Exam   Triage Vital Signs: ED Triage Vitals  Enc Vitals Group     BP 10/11/22 1222 126/80     Pulse Rate 10/11/22 1222 94     Resp 10/11/22 1222 (!) 21     Temp 10/11/22 1222 97.7 F (36.5 C)     Temp Source 10/11/22 1222 Oral     SpO2 10/11/22 1221 95 %     Weight 10/11/22 1223 86.6 kg (191 lb)     Height --      Head Circumference --      Peak Flow --      Pain Score 10/11/22 1223 0     Pain Loc --      Pain Edu? --      Excl. in [place]? --     Most recent vital signs: Vitals:   10/11/22 1221 10/11/22 1222  BP:  126/80  Pulse:  94  Resp:  (!) 21  Temp:  97.7 F (36.5 C)  SpO2: 95% 94%     General: Awake, no distress.  CV:  Good peripheral perfusion.  Resp:  Mild apnea, diffuse wheezing with poor airflow Abd:  No distention.  Other:  No calf pain or swelling   ED Results / Procedures / Treatments   Labs (all labs ordered are listed, but only abnormal results are displayed) Labs Reviewed  BASIC METABOLIC PANEL - Abnormal; Notable for the following components:      Result Value   Glucose, Bld 143 (*)    All other components within normal limits  CBC - Abnormal; Notable for the following components:   RBC 4.17 (*)    Hemoglobin 12.5 (*)    All other components within normal limits     EKG  ED ECG REPORT I, Lavonia Drafts, the attending physician, personally viewed and interpreted this ECG.  Date: 10/11/2022  Rhythm: normal sinus  rhythm QRS Axis: Left axis deviation Intervals: Abnormal ST/T Wave abnormalities: Nonspecific changes     RADIOLOGY Chest x-ray viewed interpreted by me, no evidence of pneumonia    PROCEDURES:  Critical Care performed:   Procedures   MEDICATIONS ORDERED IN ED: Medications  magnesium sulfate IVPB 2 g 50 mL (2 g Intravenous New Bag/Given 10/11/22 1423)  methylPREDNISolone sodium succinate (SOLU-MEDROL) 125 mg/2 mL injection 125 mg (125 mg Intravenous Given 10/11/22 1413)  ipratropium-albuterol (DUONEB) 0.5-2.5 (3) MG/3ML nebulizer solution 3 mL (3 mLs Nebulization Given 10/11/22 1415)  ipratropium-albuterol (DUONEB) 0.5-2.5 (3) MG/3ML nebulizer solution 3 mL (3 mLs Nebulization Given 10/11/22 1412)     IMPRESSION / MDM / ASSESSMENT AND PLAN / ED COURSE  I reviewed the triage vital signs and the nursing notes. Patient's presentation is most consistent with severe exacerbation of chronic illness.  Presents with shortness of breath as described above.  Differential includes COPD exacerbation, pneumonia, bronchitis  Significant wheezing on exam, will treat with DuoNebs, IV Solu-Medrol, IV magnesium  X-ray reviewed no evidence of pneumonia.  Lab work is overall reassuring  ----------------------------------------- 2:48 PM on 10/11/2022 ----------------------------------------- Patient reports feeling significantly better after treatment, he has less wheezing on exam.  He states that he would like to try going home, I did discuss admission with the patient however he stated that he feels comfortable going home and will return if any worsening.      FINAL CLINICAL IMPRESSION(S) / ED DIAGNOSES   Final diagnoses:  COPD exacerbation ([place])     Rx / DC Orders   ED Discharge Orders          Ordered    predniSONE (DELTASONE) 50 MG tablet  Daily with breakfast        10/11/22 1448             Note:  This document was prepared using Dragon voice recognition  software and may include unintentional dictation errors.   Lavonia Drafts, MD 10/11/22 [phone]

## 2022-10-11 NOTE — ED Triage Notes (Signed)
Pt from home via [facility], reports that he has been having SOB x 2 weeks, went to UC for same and was placed on amoxicillin and prednisone of which he completed Thursday, since Thursday has been feeling bad again with sob. Pt wears 2.5L [place] at home.

## 2022-10-12 ENCOUNTER — Ambulatory Visit: Payer: Medicare Other | Attending: Cardiology | Admitting: Cardiology

## 2022-10-12 ENCOUNTER — Encounter: Payer: Self-pay | Admitting: Cardiology

## 2022-10-12 VITALS — BP 132/70 | HR 105 | Ht 70.0 in | Wt 194.0 lb

## 2022-10-12 DIAGNOSIS — I5022 Chronic systolic (congestive) heart failure: Secondary | ICD-10-CM | POA: Diagnosis not present

## 2022-10-12 DIAGNOSIS — R0602 Shortness of breath: Secondary | ICD-10-CM | POA: Diagnosis not present

## 2022-10-12 DIAGNOSIS — J449 Chronic obstructive pulmonary disease, unspecified: Secondary | ICD-10-CM | POA: Insufficient documentation

## 2022-10-12 DIAGNOSIS — I10 Essential (primary) hypertension: Secondary | ICD-10-CM | POA: Diagnosis present

## 2022-10-12 DIAGNOSIS — E782 Mixed hyperlipidemia: Secondary | ICD-10-CM | POA: Insufficient documentation

## 2022-10-12 DIAGNOSIS — I25118 Atherosclerotic heart disease of native coronary artery with other forms of angina pectoris: Secondary | ICD-10-CM | POA: Insufficient documentation

## 2022-10-12 NOTE — Progress Notes (Signed)
Cardiology Clinic Note   Patient Name: Leslie Sims [facility] Date of Encounter: 10/12/2022  Primary Care Provider:  [name], MD Primary Cardiologist:  Kathlyn Sacramento, MD  Patient Profile    71 year old male with past medical history of CAD, ischemic cardiomyopathy, severe COPD on chronic 2 L of oxygen via nasal cannula, previous tobacco use, anxiety/depression, hyperlipidemia, prostate cancer, history of ventricular bigeminy, who is here today to follow-up on his coronary artery disease.  Past Medical History    Past Medical History:  Diagnosis Date   Allergic rhinitis    Anxiety    Back pain    Cancer (HCC)    prostate   CHF (congestive heart failure) ([place])    Colon polyps 08/2012   colonoscopy; multiple colon polyps; repeat colonoscopy in one year.   Complication of anesthesia    COPD (chronic obstructive pulmonary disease) ([place])    a. on home O2.   Coronary artery disease 11/2009   a. late presenting ant MI; b. LHC 100% pLAD s/p PCI/DES, 99% mRCA s/p PCI/DES, EF 35%; c. nuclear stress test 05/13: prior ant/inf infarcts w/o ischemia, EF 43%; d. LHC 02/14: widely patent stents with no other obs dz, EF 40%; e. LHC 11/17: LM nl, mLAD 10%, patent LAD stent, dLAD 20%, p-mRCA 10%, mRCA 40%, patent RCA stent, EF 35-45%    Depression    Dysphagia    Family history of adverse reaction to anesthesia    GERD (gastroesophageal reflux disease)    BHALPFXT(024.0)    Helicobacter pylori (H. pylori)    Hemorrhoids    Hernia    inguinal   HFrEF (heart failure with reduced ejection fraction) ([place])    a. 10/2016 LV gram: EF 35-45%; b. 06/2018 Echo: EF 40-45%. c. 01/2021 Echo: EF 40-45%   Hyperlipidemia    Hypertension    Insomnia    Ischemic cardiomyopathy    a. 10/2016 LV gram: EF 35-45%; b. 06/2018 Echo: EF 40-45%.   Myalgia    Oxygen dependent    Peptic ulcer    Pulmonary nodules    ST elevation (STEMI) myocardial infarction involving left anterior descending coronary  artery ([place]) 11/2009   a. s/p PCI to the LAD   Status post dilation of esophageal narrowing 2000   Thickening of esophagus    Tinea pedis    Wears dentures    partial upper   Past Surgical History:  Procedure Laterality Date    coronary stents     Admission  12/20/2012   COPD exacerbation.  [place].   CARDIAC CATHETERIZATION     CARDIAC CATHETERIZATION  02/05/2013   [facility]   CARDIAC CATHETERIZATION  09/19/2013   [facility] : patent stents with no change in anatomy. EF: 40$   CARDIAC CATHETERIZATION Left 11/12/2016   Procedure: Left Heart Cath and Coronary Angiography;  Surgeon: Wellington Hampshire, MD;  Location: [place] [facility];  Service: Cardiovascular;  Laterality: Left;   COLONOSCOPY     COLONOSCOPY WITH PROPOFOL N/A 05/18/2016   Procedure: COLONOSCOPY WITH PROPOFOL;  Surgeon: Lucilla Lame, MD;  Location: [facility] ENDOSCOPY;  Service: Endoscopy;  Laterality: N/A;   COLONOSCOPY WITH PROPOFOL N/A 03/19/2020   Procedure: COLONOSCOPY WITH PROPOFOL;  Surgeon: Lin Landsman, MD;  Location: [facility] ENDOSCOPY;  Service: Gastroenterology;  Laterality: N/A;   CORONARY ANGIOPLASTY WITH STENT PLACEMENT  09/19/2009   LAD 3.0 X23 mm Xience DES, RCA: 4.0 X 15 mm Xience DES   ELECTROMAGNETIC NAVIGATION BROCHOSCOPY N/A 10/27/2017  Procedure: ELECTROMAGNETIC NAVIGATION BRONCHOSCOPY;  Surgeon: Flora Lipps, MD;  Location: [facility] ORS;  Service: Cardiopulmonary;  Laterality: N/A;   ESOPHAGEAL DILATION     ESOPHAGOGASTRODUODENOSCOPY  12/20/2006   ESOPHAGOGASTRODUODENOSCOPY  08/20/2012   ESOPHAGOGASTRODUODENOSCOPY (EGD) WITH PROPOFOL N/A 04/19/2022   Procedure: ESOPHAGOGASTRODUODENOSCOPY (EGD) WITH PROPOFOL;  Surgeon: [name], MD;  Location: [facility] ENDOSCOPY;  Service: Endoscopy;  Laterality: N/A;   ESOPHAGOGASTRODUODENOSCOPY (EGD) WITH PROPOFOL N/A 04/27/2022   Procedure: ESOPHAGOGASTRODUODENOSCOPY (EGD) WITH PROPOFOL;  Surgeon: [name], MD;  Location: [facility] ENDOSCOPY;  Service: Endoscopy;   Laterality: N/A;   EUS N/A 05/20/2022   Procedure: UPPER ENDOSCOPIC ULTRASOUND (EUS) LINEAR;  Surgeon: Reita Cliche, MD;  Location: [facility] ENDOSCOPY;  Service: Gastroenterology;  Laterality: N/A;  [facility]   HERNIA REPAIR  07/20/2012   L inguinal hernia repair   PENILE PROSTHESIS IMPLANT      Allergies  Allergies  Allergen Reactions   Prozac [Fluoxetine Hcl] Shortness Of Breath   Hydroxyzine    Bupropion Other (See Comments)    "Makes me feel funny" Other reaction(s): Other (See Comments) Unknown/"made me feel real funny" "Makes me feel funny" "Makes me feel funny" Other reaction(s): Other (See Comments) Unknown/"made me feel real funny" "Makes me feel funny"   Effexor Xr [Venlafaxine Hcl Er] Other (See Comments)    "Makes me feel funny"    History of Present Illness    Leslie Sims is a 71 year old male with a previous lady listed past medical history of coronary artery disease, ischemic cardiomyopathy, severe COPD, previous tobacco use, anxiety/depression, hyperlipidemia, prostate cancer, ventricular bigeminy.  Has history of CAD started with an anterior MI in 2014 which is a finding of occluded LAD with 90% stenosis in the RCA.  He had a DES placed to the LAD and RCA at that time.  He subsequently underwent repeat heart catheterization November 2017 which showed patent LAD and RCA stents, LVEF of 35-40%, repeat echocardiogram revealed improved EF 40-45%, G2 DD, without valvular abnormalities.  History of multiple hospitalizations for COPD, quit smoking in 2020.  He did have an admission in July 2023 for COPD exacerbation which she had chest pressure and was thought to be from his COPD at that time.  Clinic evaluation on 07/2022 he continues to report chronic chest pressure on the left side and left chest and shoulder pain.  The pain worsened when he underwent chemotherapy.  He did undergo Myoview stress testing which revealed no significant ischemia, large region fixed defect  mid to distal anterior wall and apical region.  EF estimated 55% without EKG changes concerning for ischemia at peak stress recovery, moderate risk scan given the large fixed anterior wall defect.  He was last seen in clinic 09/28/2022 where he continued to complain of discomfort in the left upper chest and left shoulder that was unchanged from his previous visit.  He was previously decreased since he had finished chemotherapy and radiation but was still noticeable.  He also notes that he was currently on antibiotics again for issues with his COPD and remains on chronic oxygen via nasal cannula.  He was started on Imdur and then scheduled for follow-up.  He was evaluated at the [facility] emergency department on 10/11/2022 for shortness of breath.  And he reported worsening breathing over the last 2 weeks and been tried on prednisone and antibiotics with some improvement but symptoms had worsened again.  He was given 2 g of magnesium IVP, Solu-Medrol 125 mg IVP, and DuoNeb nebulizers.  Chest x-ray revealed no evidence of pneumonia and his lab work was overall reassuring.  After steroids and DuoNebs he was feeling significantly better after treatment with less wheezing on exam.  He was subsequently discharged home with a diagnosis of COPD exacerbation.  He returns to clinic today evaluated in the emergency department in urgent care for COPD flare.  He has had several medications for.  He does follow-up with pulmonary on Thursday of this week.  He denies any current chest pain but he does state that he has discomfort with deep breathing at his last visit he was started on Imdur and was asking we did continue to give him a headache because the headache had gone away after he taken it for subsequent days.  2.5L of O2 via [place] at rest and he turns it up to 3Lpm with exertion.  He currently chest pain shortness of breath dyspnea on exertion COPD exacerbation.  Home Medications    Current Outpatient Medications   Medication Sig Dispense Refill   albuterol (VENTOLIN HFA) 108 (90 Base) MCG/ACT inhaler TAKE 2 PUFFS BY MOUTH EVERY 6 HOURS AS NEEDED FOR WHEEZE OR SHORTNESS OF BREATH 18 each 4   aspirin 81 MG tablet Take 81 mg by mouth daily.     atorvastatin (LIPITOR) 80 MG tablet Take 1 tablet (80 mg total) by mouth daily. 90 tablet 3   azithromycin (ZITHROMAX) 250 MG tablet TAKE 1 TABLET BY MOUTH THREE TIMES A WEEK 12 tablet 4   budesonide (PULMICORT) 0.5 MG/2ML nebulizer solution Take 2 mLs (0.5 mg total) by nebulization in the morning and at bedtime. 320 mL 12   docusate sodium (COLACE) 100 MG capsule Take 1 capsule (100 mg total) by mouth 2 (two) times daily. 60 capsule 0   esomeprazole (NEXIUM) 40 MG capsule TAKE ONE CAPSULE BY MOUTH ONCE DAILY 30 capsule 4   fluticasone (FLONASE) 50 MCG/ACT nasal spray Place 2 sprays into both nostrils daily.     furosemide (LASIX) 20 MG tablet Take 1 tablet (20 mg total) by mouth daily. 30 tablet 2   ipratropium-albuterol (DUONEB) 0.5-2.5 (3) MG/3ML SOLN Take 3 mLs by nebulization 2 (two) times daily.     isosorbide mononitrate (IMDUR) 30 MG 24 hr tablet Take 1 tablet (30 mg total) by mouth daily. 30 tablet 2   ketoconazole (NIZORAL) 2 % cream Apply topically 2 (two) times daily.     loratadine (CLARITIN) 10 MG tablet Take 10 mg by mouth daily.     LORazepam (ATIVAN) 1 MG tablet Take 1 tablet (1 mg total) by mouth every 4 (four) hours while awake. (Patient taking differently: Take 1 mg by mouth 3 (three) times daily.) 30 tablet 0   losartan (COZAAR) 25 MG tablet Take 25 mg by mouth daily.     mirtazapine (REMERON) 45 MG tablet Take 45 mg by mouth at bedtime.     Multiple Vitamin (MULTIVITAMIN) capsule Take 1 capsule by mouth daily.     nitroGLYCERIN (NITROSTAT) 0.4 MG SL tablet Place 1 tablet (0.4 mg total) under the tongue every 5 (five) minutes as needed. 25 tablet 3   OXYGEN Inhale 2.5 L into the lungs daily.     polyethylene glycol (MIRALAX) 17 g packet Take 17 g  by mouth daily as needed.     predniSONE (DELTASONE) 50 MG tablet Take 1 tablet (50 mg total) by mouth daily with breakfast. 4 tablet 0   Spacer/Aero-Holding Chambers (AEROCHAMBER MV) inhaler Use as instructed 1 each 2  sucralfate (CARAFATE) 1 g tablet Take 1 g by mouth 2 (two) times daily with a meal.     No current facility-administered medications for this visit.     Family History    Family History  Problem Relation Age of Onset   Heart attack Brother        Brother #1   Diabetes Brother    Hypertension Brother        #3   Coronary artery disease Father 47       deceased   Heart attack Father    Diabetes Father    Heart disease Father    COPD Mother 43       deceased   Alcohol abuse Sister        polysubstance abuse   COPD Sister    Lung cancer Sister    Alcohol abuse Sister        polysubstance abuse   Penile cancer Brother    Diabetes Brother    Prostate cancer Neg Hx    Bladder Cancer Neg Hx    Kidney cancer Neg Hx    He indicated that his mother is deceased. He indicated that his father is deceased. He indicated that both of his sisters are deceased. He indicated that three of his five brothers are alive. He indicated that the status of his neg hx is unknown.  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  Vaping Use   Vaping Use: Never used  Substance and Sexual Activity   Alcohol use: No    Alcohol/week: 0.0 standard drinks of  alcohol   Drug use: No   Sexual activity: Yes  Other Topics Concern   Not on file  Social History Narrative   Marital status: married x 32 years; happily married.      Children: 4 children;  1 stepchild and 15 grandchildren; no great grandchildren.      Lives: with wife; 2 dogs, 1 cat.      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      Tobacco: 1 ppd x 45 years      Alcohol: none; previous alcoholism      Drugs: none      Exercise: no formal exercise; physically demanding job; owns horses.      Seatbelts:  100%      Guns: one loaded unsecured gun in the home.      Sunscreen: none   Social Determinants of Radio broadcast assistant Strain: Not on file  Food Insecurity: Not on file  Transportation Needs: Not on file  Physical Activity: Not on file  Stress: Not on file  Social Connections: Not on file  Intimate Partner Violence: Not on file     Review of Systems    General:  No chills,  fever, night sweats or weight changes.  Endorses fatigue Cardiovascular:  No chest pain, endorses dyspnea dyspnea on exertion, edema, orthopnea, palpitations, paroxysmal nocturnal dyspnea. Dermatological: No rash, lesions/masses Respiratory: Endorses cough, dyspnea Urologic: No hematuria, dysuria Abdominal:   No nausea, vomiting, diarrhea, bright red blood per rectum, melena, or hematemesis Neurologic:  No visual changes, wkns, changes in mental status. All other systems reviewed and are otherwise negative except as noted above.   Physical Exam    VS:  BP 132/70 (BP Location: Left Arm, Patient Position: Sitting, Cuff Size: Normal)   Pulse (!) 105   Ht '5\' 10"'$  (1.778 m)   Wt 194 lb (88 kg)   SpO2 95%   BMI 27.84 kg/m  , BMI Body mass index is 27.84 kg/m.     GEN: Well nourished, well developed, in no acute distress. HEENT: normal. Neck: Supple, no JVD, carotid bruits, or masses. Cardiac: RRR, no murmurs, rubs, or gallops. No clubbing, cyanosis, edema.  Radials/DP/PT  2+ and equal bilaterally.  Respiratory:  Respirations regular and unlabored, diminished to auscultation bilaterally.  Currently on 2.5 L of oxygen via nasal cannula GI: Soft, nontender, nondistended, BS + x 4. MS: no deformity or atrophy. Skin: warm and dry, no rash. Neuro:  Strength and sensation are intact. Psych: Normal affect.  Accessory Clinical Findings    ECG personally reviewed by me today-no new tracings were done today  Lab Results  Component Value Date   WBC 6.8 10/11/2022   HGB 12.5 (L) 10/11/2022   HCT 40.0 10/11/2022   MCV 95.9 10/11/2022   PLT 190 10/11/2022   Lab Results  Component Value Date   CREATININE 0.76 10/11/2022   BUN 19 10/11/2022   NA 140 10/11/2022   K 3.9 10/11/2022   CL 103 10/11/2022   CO2 28 10/11/2022   Lab Results  Component Value Date   ALT 27 09/06/2022   AST 29 09/06/2022   ALKPHOS 68 09/06/2022   BILITOT 0.5 09/06/2022   Lab Results  Component Value Date   CHOL 165 03/18/2021   HDL 69 03/18/2021   LDLCALC 81 03/18/2021   TRIG 77 03/18/2021   CHOLHDL 2.4 03/18/2021    Lab Results  Component Value Date   HGBA1C 6.2 (H) 01/30/2019    Assessment & Plan   1.  Coronary artery disease with remote stenting.  DES placed in LAD and RCA.  Chest discomfort is resolved.  He has been started on Imdur 30 mg daily without continued complaints of chest discomfort or headache.  His last heart catheterization was in 2017 which showed patent LAD and RCA stents and nonobstructive CAD.  He states that his chest pain was exacerbated by chemotherapy and radiation therapy.  Previously his chest discomfort was reproducible.  He had recently underwent Myoview stress testing which revealed no significant ischemia.  2.  HFrEF with his last echocardiogram revealing an LVEF of 40-45%.  He is euvolemic on exam.  He is unable to tolerate beta-blocker therapy due to his COPD.  He is continued on furosemide and losartan.  Unable to escalate GDMT at this time  with him being currently in a COPD exacerbation.  3.  Severe COPD patient on chronic oxygen therapy 2.5 L/min with chronic shortness of breath.  Was just recently evaluated in urgent care and the emergency department for exacerbation.  He has been encouraged to continue to follow with pulmonary and keep his follow-up appointment on Thursday of this week.  4.  Hypertension with blood  pressure today 132/70, which continues to remain stable after previously reducing his furosemide.  He continues to take losartan 25 mg daily.  He has been encouraged to continue to monitor his pressures at home.  5.  Hyperlipidemia with an LDL 67 in 02/2021 he has been continued on atorvastatin 80, on his next blood draw he will need lipid panel  6.  Disposition patient return to clinic to see MD/APP in 2 months to follow-up on his chest discomfort after being continued on Imdur.  He has been encouraged to continue with his evaluation with pulmonary.  Leslie Westberg, NP 10/12/2022, 5:22 PM

## 2022-10-12 NOTE — Patient Instructions (Signed)
Medication Instructions:  No changes at this time.   *If you need a refill on your cardiac medications before your next appointment, please call your pharmacy*   Lab Work: None  If you have labs (blood work) drawn today and your tests are completely normal, you will receive your results only by: [place] (if you have MyChart) OR A paper copy in the mail If you have any lab test that is abnormal or we need to change your treatment, we will call you to review the results.   Testing/Procedures: None   Follow-Up: At [facility], you and your health needs are our priority.  As part of our continuing mission to provide you with exceptional heart care, we have created designated Provider Care Teams.  These Care Teams include your primary Cardiologist (physician) and Advanced Practice Providers (APPs -  Physician Assistants and Nurse Practitioners) who all work together to provide you with the care you need, when you need it.   Your next appointment:   2 month(s)  The format for your next appointment:   In Person  Provider:   Kathlyn Sacramento, MD or Gerrie Nordmann, NP        Important Information About Sugar

## 2022-10-14 ENCOUNTER — Ambulatory Visit (INDEPENDENT_AMBULATORY_CARE_PROVIDER_SITE_OTHER): Payer: Medicare Other | Admitting: Pulmonary Disease

## 2022-10-14 ENCOUNTER — Telehealth: Payer: Self-pay | Admitting: Cardiovascular Disease

## 2022-10-14 ENCOUNTER — Encounter: Payer: Self-pay | Admitting: Pulmonary Disease

## 2022-10-14 VITALS — BP 118/68 | HR 104 | Temp 98.3°F | Ht 70.0 in | Wt 194.4 lb

## 2022-10-14 DIAGNOSIS — J9611 Chronic respiratory failure with hypoxia: Secondary | ICD-10-CM | POA: Diagnosis not present

## 2022-10-14 DIAGNOSIS — J441 Chronic obstructive pulmonary disease with (acute) exacerbation: Secondary | ICD-10-CM | POA: Diagnosis not present

## 2022-10-14 DIAGNOSIS — J449 Chronic obstructive pulmonary disease, unspecified: Secondary | ICD-10-CM | POA: Diagnosis not present

## 2022-10-14 MED ORDER — DOXYCYCLINE HYCLATE 100 MG PO TABS: 100.0000 mg | ORAL_TABLET | Freq: Two times a day (BID) | ORAL | 0 refills | Status: DC

## 2022-10-14 MED ORDER — PREDNISONE 10 MG (21) PO TBPK: ORAL_TABLET | ORAL | 0 refills | Status: DC

## 2022-10-14 NOTE — Patient Instructions (Signed)
We have sent an extended prednisone taper to your pharmacy.  We have also sent an antibiotic called doxycycline that you will take twice a day for 7 days.  Make sure that you do not get to exposed to the sun while on this medication and if you go outside make sure you wear sunscreen and be protected as it can react with the sun.  We will see you in follow-up in 4 to 6 weeks time call sooner should any new problems arise.

## 2022-10-14 NOTE — Progress Notes (Signed)
Subjective:    Patient ID: Leslie Sims, male    DOB: [birth year], 71 y.o.   MRN: [id number] Patient Care Team: [name], MD as PCP - General (Family Medicine) Iran Ouch, MD as PCP - Cardiology (Cardiology) [name], MD (Family Medicine) [place] Ouch, MD as Consulting Physician (Cardiology) Glory Buff, RN as Registered Nurse Salena Saner, MD as Consulting Physician (Pulmonary Disease) Carmina Miller, MD as Radiation Oncologist (Radiation Oncology) Benita Gutter, RN as Oncology Nurse Navigator  Chief Complaint  Patient presents with   Follow-up    ER on 10/11/2022. Wheezing and SOB. Dry cough.   Patient ID: Leslie Sims, male    DOB: Nov 18, 1951 MRN: [id number] Requesting MD/Service: Nilda Simmer, MD Date of initial consultation: 08/10/16 by Dr. Sung Amabile Reason for consultation: COPD, smoker   PT PROFILE: 52 M, former smoker quit 02/10/2019 with severe emphysema seen by multiple pulmonologists in past ([place], [place], [place], [place]) referred for further eval and mgmt of COPD.  Previously seen by Dr. Sung Amabile.   DATA/EVENTS: 07/24/12 Office Spirometry: Severe obstruction (FEV1 31% pred) 08/31/13 Office Spirometry: very severe obstruction (FEV1 25% pred) 06/18/15 CT chest: Severe emphysema 03/03/17 Overnight oximetry: Lowest SpO2 72%. 95% of time SpO2 was below 90% 03/03/17 : 384 meters. Desat to 84% 10/04/17 CT chest: Two new irregularly marginated nodules, one in the RUL, and a second in the mid LUL worrisome for either synchronous lung ca or metastatic involvement of the lungs. The two small nodules described on the prior dictated report have resolved in the left superior segment and most likely were postinflammatory. Diffuse centrilobular emphysema. 10/13/17 PET scan: The new left upper lobe nodule is FDG avid consistent with malignancy. Recommend tissue confirmation. The new nodule in the right upper lobe demonstrates low  level uptake. While the level of uptake suggests the possibility of an inflammatory or infectious process, a low-grade malignancy is not excluded. Given the suspected malignancy on the left, tissue confirmation should be considered on the right despite the low level of uptake. No distant metastatic disease identified 10/27/17 ENB ([facility]): Transbronchial Fine Needle Aspirations 21G X7, Transbronchial Forceps Biopsy X6. ALVEOLATED LUNG TISSUE WITH HEMOSIDERIN LADEN MACROPHAGES.  NEGATIVE FOR ATYPIA AND MALIGNANCY 11/01/17 PFTs: FVC: 2.96 > 3.14 L (68 > 72 %pred), FEV1: 0.94 > 0.93 L (27 %pred), FEV1/FVC: 32%, TLC: 9.04 L (137 %pred), DLCO 62 %pred 12/07/17 CTA chest: No pulmonary embolus identified. Anterior left upper lobe ground-glass and nodular consolidation likely representing pneumonitis. Stable bilateral upper lobe pulmonary nodules. Stable emphysema. 01/21/18 CT chest: new masslike architectural distortion and ground-glass attenuation surrounding the index nodule within the RUL. The appearance favors an inflammatory or infectious process. The index nodule in the left upper lobe is slightly decreased in volume when compared with the previous exam favoring a benign process. Attention on follow-up imaging advise. Advanced changes of emphysema (very severe) 06/01/18 CTA chest: No evidence of pulmonary embolism. Waxing and waning bilateral pulmonary nodular process as described. New 8 mm nodular ill-defined focus of opacification over the lingula which may represent an acute inflammatory or infectious process. No effusion. Emphysema 10/19/18 CTA chest: No acute cardiopulmonary disease and no evidence of pulm embolism. Hospitalization 1/12-1/14/2020: COPD exacerbation Hospitalization 2/22-2/27/20: COPD exacerbation, HCAP 02/11/19 CT chest: Progressed masslike consolidation within the RUL in the region of previous waxing and waning nodule, this is contiguous with the pleural surface and demonstrates  spiculated margin and probable small focus of central cavitation. Findings could be secondary  to infection superimposed on chronic nodule. However cavitary neoplasm is also a concern. Multiple additional lung nodules which have not significantly changed. Small foci of ground-glass density in the RUL may reflect small focus of infection. Mod-severe emphysema 05/01/19 CT chest: Waxing/waning right lung opacities, suggesting infection, including atypical/mycobacterial infection. Improvement of the prior dominant masslike opacity argues against neoplasm. Stable left upper lobe nodule, likely benign. Aortic Atherosclerosis and Emphysema 11/02/2019 CT chest: Multiple larger pulmonary nodules and masslike areas seen on the prior study have largely regressed.  Findings indicative of infectious/inflammatory process.  Diffuse bronchial wall thickening with moderate centrilobular and paraseptal emphysema.  Suggestion of left main and three-vessel coronary artery disease. 04/23/2020 CT chest: "Multiple bilateral stable pulmonary nodules. New 7 mm nodule over the posterior left upper lobe. Stable nodularity over an area ofright upper lobe/apical scarring with new 9 mm nodular focus along the most inferior aspect of this scarring. This area has shown to wax and wane on previous exams and is most likely inflammatory". 07/24/2020 CT chest: "Background of marked pulmonary emphysema with stable post infectious or inflammatory changes and with near complete resolution of a presumably infectious or inflammatory nodule in the LEFT upper lobe as compared to previous exam". 01/21/2021 CT chest: Severe emphysematous changes, pulmonary scarring, bilateral calcified granulomas. 01/29/2021 PFTs: Further decline in lung function.  FEV1 0.74 L or 22% predicted, FVC 1.91 L or 42% predicted.  No significant bronchodilator response.  FEV1/FVC 39%.  Patient could not perform lung volumes. Diffusion capacity severely reduced.  This is consistent  with very severe COPD. 02/13/2021 2D echo: LVEF 40 to 45% global hypokinesis, severe hypokinesis of the periapical region.  Grade II diastolic dysfunction, dilated left atrium. 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  Biopsies high-grade dysplastic. 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 05/09/2022 PET/CT: Intense tracer uptake associated with the distal esophageal mass. 06/03/2022 chemotherapy-XRT: Patient began concurrent carboplatin/Taxol/XRT. 07/26/2022 completed XRT: Completing chemotherapy. 09/02/2022 PET/CT: Distal esophageal wall thickening with significant reduction in abnormal FDG activity, small focus of minimally metabolic activity, possibly reflecting posttreatment change.  No evidence of hypermetabolic metastatic disease. 10/11/2022 chest x-ray: Hyperinflation of the lungs no acute cardiopulmonary disease.  Granuloma of the LEFT upper lobe.   INTERVAL: Last seen by  me on 05 August 2022.   Since then has had prolonged exacerbation starting 3 weeks ago.  HPI The patient is a 71 year old former smoker (quit 2020, 126 PY) with severe stage IV COPD and chronic hypoxic respiratory failure on supplemental oxygen who presents here for follow-up on these issues.  His respiratory status is also compromised by ischemic cardiomyopathy with LVEF of 40% and grade II diastolic dysfunction giving him chronic combined systolic and diastolic heart failure.  He has frequent visits to the ED due to COPD exacerbations and CHF exacerbations.  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  He has been undergoing concurrent chemo therapy with carboplatin/Taxol and radiation.  He completed radiation 26 July 2022.  He has tolerated therapy surprisingly well.  He has been having issues with swallowing after the therapy but this is improving day by day.  He has  started PT/OT at home and feels that this helps.  Recent nuclear medicine stress test was negative for active ischemia.    Over the last 3 weeks he has had an issue with an exacerbation of COPD.  This initially occurred when he entered  a barn that was storing bales of hay.  He feels that he developed an allergic reaction.  He has received antibiotics twice and a short prednisone course through the ED but has persistent symptoms.  He does feel that the prednisone is "starting to kick in" but he completes this tomorrow.  He does not endorse any fevers, chills or sweats.  He has had some cough productive of yellowish to greenish sputum.  He is concerned that this may turn into pneumonia.  Shortness of breath is at baseline.  He is compliant with oxygen therapy.  He does not endorse any other symptomatology.  He has not had to escalate the liter flow of his oxygen.    Review of Systems A 10 point review of systems was performed and it is as noted above otherwise negative.  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   COPD exacerbation (HCC) 07/31/2021   Prostate cancer (HCC) 10/11/2020   Elevated PSA 07/28/2020   Iron deficiency anemia 05/20/2020   Goals of care, counseling/discussion    Palliative care by specialist    Healthcare-associated pneumonia 01/28/2019   COPD with acute exacerbation (HCC) 12/31/2018   Generalized anxiety disorder 11/12/2018   Moderate episode of recurrent major depressive disorder (HCC) 11/12/2018   Acute respiratory failure with hypoxia (HCC) 09/29/2018   Acute respiratory failure with hypoxia and hypercapnia (HCC) 09/13/2018   Acute on chronic respiratory failure with hypoxia and  hypercapnia (HCC) 08/24/2018   Atypical chest pain 07/02/2018   Grief reaction 01/13/2018   COPD with hypoxia (HCC) 12/25/2017   Unstable angina (HCC)    Benign neoplasm of ascending colon    Benign neoplasm of descending colon    Benign neoplasm of sigmoid colon    History of colonic polyps    Pulmonary nodules/lesions, multiple 01/05/2015   Bruit of left carotid artery 11/04/2014   Sleep disorder 07/18/2013   Seasonal and perennial allergic rhinitis 05/29/2013   Bradycardia 04/20/2011   SMOKER 07/30/2010   CAD, NATIVE VESSEL 04/23/2010   Hyperlipidemia 03/24/2010   DEPRESSION/ANXIETY 03/24/2010   Chronic systolic heart failure (HCC) 03/24/2010   COPD, very severe (HCC) 03/24/2010   GERD 03/24/2010   Essential hypertension 11/21/2009   Social History   Tobacco Use   Smoking status: Former    Packs/day: 3.00    Years: 42.00    Total pack years: 126.00    Types: Cigarettes    Quit date: 02/10/2019    Years since quitting: 3.6   Smokeless tobacco: Never   Tobacco comments:    25 months ago most smoked 4 packs a day .  Substance Use Topics   Alcohol use: No    Alcohol/week: 0.0 standard drinks of alcohol   Allergies  Allergen Reactions   Prozac [Fluoxetine Hcl] Shortness Of Breath   Hydroxyzine    Bupropion Other (See Comments)    "Makes me feel funny" Other reaction(s): Other (See Comments) Unknown/"made me feel real funny" "Makes me feel funny" "Makes me feel funny" Other reaction(s): Other (See Comments) Unknown/"made me feel real funny" "Makes me feel funny"   Effexor Xr [Venlafaxine Hcl Er] Other (See Comments)    "Makes me feel funny"   Current Meds  Medication Sig   albuterol (  VENTOLIN HFA) 108 (90 Base) MCG/ACT inhaler TAKE 2 PUFFS BY MOUTH EVERY 6 HOURS AS NEEDED FOR WHEEZE OR SHORTNESS OF BREATH   aspirin 81 MG tablet Take 81 mg by mouth daily.   atorvastatin (LIPITOR) 80 MG tablet Take 1 tablet (80 mg total) by mouth daily.   budesonide (PULMICORT) 0.5  MG/2ML nebulizer solution Take 2 mLs (0.5 mg total) by nebulization in the morning and at bedtime.   docusate sodium (COLACE) 100 MG capsule Take 1 capsule (100 mg total) by mouth 2 (two) times daily.   esomeprazole (NEXIUM) 40 MG capsule TAKE ONE CAPSULE BY MOUTH ONCE DAILY   fluticasone (FLONASE) 50 MCG/ACT nasal spray Place 2 sprays into both nostrils daily.   furosemide (LASIX) 20 MG tablet Take 1 tablet (20 mg total) by mouth daily.   ipratropium-albuterol (DUONEB) 0.5-2.5 (3) MG/3ML SOLN Take 3 mLs by nebulization 2 (two) times daily.   isosorbide mononitrate (IMDUR) 30 MG 24 hr tablet Take 1 tablet (30 mg total) by mouth daily.   ketoconazole (NIZORAL) 2 % cream Apply topically 2 (two) times daily.   loratadine (CLARITIN) 10 MG tablet Take 10 mg by mouth daily.   LORazepam (ATIVAN) 1 MG tablet Take 1 tablet (1 mg total) by mouth every 4 (four) hours while awake. (Patient taking differently: Take 1 mg by mouth 3 (three) times daily.)   losartan (COZAAR) 25 MG tablet Take 25 mg by mouth daily.   mirtazapine (REMERON) 45 MG tablet Take 45 mg by mouth at bedtime.   Multiple Vitamin (MULTIVITAMIN) capsule Take 1 capsule by mouth daily.   nitroGLYCERIN (NITROSTAT) 0.4 MG SL tablet Place 1 tablet (0.4 mg total) under the tongue every 5 (five) minutes as needed.   OXYGEN Inhale 2.5 L into the lungs daily.   polyethylene glycol (MIRALAX) 17 g packet Take 17 g by mouth daily as needed.   predniSONE (DELTASONE) 50 MG tablet Take 1 tablet (50 mg total) by mouth daily with breakfast.   Spacer/Aero-Holding Chambers (AEROCHAMBER MV) inhaler Use as instructed   sucralfate (CARAFATE) 1 g tablet Take 1 g by mouth 2 (two) times daily with a meal.   Immunization History  Administered Date(s) Administered   Fluad Quad(high Dose 65+) 11/16/2021   Influenza Whole 09/19/2012   Influenza, High Dose Seasonal PF 09/28/2019   Influenza,inj,Quad PF,6+ Mos 10/24/2013, 11/25/2014, 03/02/2016, 10/27/2016, 10/05/2017    Influenza-Unspecified 10/24/2013, 11/25/2014, 03/02/2016, 10/27/2016, 10/05/2017, 09/18/2018, 09/28/2019, 09/30/2020   Moderna Sars-Covid-2 Vaccination 01/26/2020, 02/23/2020   PFIZER Comirnaty(Gray Top)Covid-19 Tri-Sucrose Vaccine 12/15/2020, 07/15/2021   PFIZER(Purple Top)SARS-COV-2 Vaccination 01/26/2020, 02/23/2020   PNEUMOCOCCAL CONJUGATE-20 03/22/2022   Pfizer Covid-19 Vaccine Bivalent Booster 87yrs & up 01/14/2022, 05/25/2022   Pneumococcal Conjugate-13 11/25/2014   Pneumococcal Polysaccharide-23 08/24/2016, 10/27/2016   Pneumococcal-Unspecified 12/20/2008   Tdap 12/20/2008       Objective:   Physical Exam BP 118/68 (BP Location: Left Arm, Cuff Size: Normal)   Pulse (!) 104   Temp 98.3 F (36.8 C)   Ht 5\' 10"  (1.778 m)   Wt 194 lb 6.4 oz (88.2 kg)   SpO2 95%   BMI 27.89 kg/m   SpO2: 95 % O2 Device: Nasal cannula O2 Flow Rate (L/min): 3 L/min  GENERAL: Well-developed well-nourished gentleman, chronic use of accessories, no conversational dyspnea. No acute distress.  Ambulating with cane. Using oxygen via portable concentrator at 2 L pulsed. HEAD: Normocephalic, atraumatic. EYES: Pupils equal, round, reactive to light.  No scleral icterus. MOUTH: Poor dentition, oral mucosa moist.  No  thrush. NECK: Supple. No thyromegaly. No nodules. No JVD.  Trachea midline, no crepitus. PULMONARY: Very distant breath sounds, not moving much air, no adventitious sounds. CARDIOVASCULAR: S1 and S2. Regular rate and rhythm.  No rubs murmurs or gallops heard. GASTROINTESTINAL: Protuberant abdomen, soft, benign. MUSCULOSKELETAL: No joint deformity, no clubbing, no edema. NEUROLOGIC: Awake, alert, oriented.  No overt focal deficits.  Speech is fluent. SKIN: Intact,warm,dry.  Limited exam: No rashes. PSYCH: Normal mood and behavior.      Assessment & Plan:     ICD-10-CM   1. Stage 4 very severe COPD by GOLD classification (HCC)  J44.9    Continue DuoNeb Continue Pulmicort Continue  as needed albuterol    2. COPD exacerbation (HCC)  J44.1    Extended prednisone taper Doxycycline 100 mg twice daily x 7 days    3. Chronic hypoxemic respiratory failure (HCC)  J96.11    Patient compliant with O2 Continue therapy     Meds ordered this encounter  Medications   predniSONE (STERAPRED UNI-PAK 21 TAB) 10 MG (21) TBPK tablet    Sig: Start when you finish your current prednisone and take as directed in the package.    Dispense:  21 tablet    Refill:  0   doxycycline (VIBRA-TABS) 100 MG tablet    Sig: Take 1 tablet (100 mg total) by mouth 2 (two) times daily. Make sure you are protected from the sun if going outside while taking this medication.    Dispense:  14 tablet    Refill:  0   Will see the patient in follow-up in 4 to 6 weeks time he is to contact us prior to that time should any new difficulties arise.   Gailen Shelter, MD Advanced Bronchoscopy PCCM [facility]    *This note was dictated using voice recognition software/Dragon.  Despite best efforts to proofread, errors can occur which can change the meaning. Any transcriptional errors that result from this process are unintentional and may not be fully corrected at the time of dictation.

## 2022-10-14 NOTE — Telephone Encounter (Signed)
Patient came by office  States Gerrie Nordmann wanted an update from Dr Patsey Berthold appointment States she wants him to taper down prednisone and also sent in a 7 day Zpak He will return to see her on 12/21

## 2022-10-15 NOTE — Telephone Encounter (Signed)
Left detailed voicemail message on home number.  Left detailed voicemail message on mobile number.   Message was that we received update and sent to provider with no need to call back unless he has any further needs.

## 2022-10-27 ENCOUNTER — Other Ambulatory Visit: Payer: Self-pay

## 2022-10-27 ENCOUNTER — Emergency Department
Admission: EM | Admit: 2022-10-27 | Discharge: 2022-10-27 | Disposition: A | Discharge status: Home / Self Care | Payer: Medicare Other | Attending: Emergency Medicine | Admitting: Emergency Medicine

## 2022-10-27 ENCOUNTER — Telehealth: Payer: Self-pay | Admitting: Pulmonary Disease

## 2022-10-27 ENCOUNTER — Emergency Department: Payer: Medicare Other

## 2022-10-27 DIAGNOSIS — I509 Heart failure, unspecified: Secondary | ICD-10-CM | POA: Diagnosis not present

## 2022-10-27 DIAGNOSIS — I110 Hypertensive heart disease with heart failure: Secondary | ICD-10-CM | POA: Insufficient documentation

## 2022-10-27 DIAGNOSIS — Z8501 Personal history of malignant neoplasm of esophagus: Secondary | ICD-10-CM | POA: Insufficient documentation

## 2022-10-27 DIAGNOSIS — J441 Chronic obstructive pulmonary disease with (acute) exacerbation: Secondary | ICD-10-CM | POA: Diagnosis not present

## 2022-10-27 DIAGNOSIS — Z20822 Contact with and (suspected) exposure to covid-19: Secondary | ICD-10-CM | POA: Diagnosis not present

## 2022-10-27 DIAGNOSIS — R0602 Shortness of breath: Secondary | ICD-10-CM | POA: Diagnosis present

## 2022-10-27 DIAGNOSIS — J9611 Chronic respiratory failure with hypoxia: Secondary | ICD-10-CM | POA: Insufficient documentation

## 2022-10-27 LAB — CBC WITH DIFFERENTIAL/PLATELET
Abs Immature Granulocytes: 0.06 10*3/uL (ref 0.00–0.07)
Basophils Absolute: 0 10*3/uL (ref 0.0–0.1)
Basophils Relative: 0 %
Eosinophils Absolute: 0.1 10*3/uL (ref 0.0–0.5)
Eosinophils Relative: 2 %
HCT: 40 % (ref 39.0–52.0)
Hemoglobin: 12.9 g/dL — ABNORMAL LOW (ref 13.0–17.0)
Immature Granulocytes: 1 %
Lymphocytes Relative: 9 %
Lymphs Abs: 0.5 10*3/uL — ABNORMAL LOW (ref 0.7–4.0)
MCH: 30.6 pg (ref 26.0–34.0)
MCHC: 32.3 g/dL (ref 30.0–36.0)
MCV: 94.8 fL (ref 80.0–100.0)
Monocytes Absolute: 0.8 10*3/uL (ref 0.1–1.0)
Monocytes Relative: 13 %
Neutro Abs: 4.5 10*3/uL (ref 1.7–7.7)
Neutrophils Relative %: 75 %
Platelets: 183 10*3/uL (ref 150–400)
RBC: 4.22 MIL/uL (ref 4.22–5.81)
RDW: 13.2 % (ref 11.5–15.5)
WBC: 6 10*3/uL (ref 4.0–10.5)
nRBC: 0 % (ref 0.0–0.2)

## 2022-10-27 LAB — BASIC METABOLIC PANEL
Anion gap: 9 (ref 5–15)
BUN: 13 mg/dL (ref 8–23)
CO2: 29 mmol/L (ref 22–32)
Calcium: 9 mg/dL (ref 8.9–10.3)
Chloride: 104 mmol/L (ref 98–111)
Creatinine, Ser: 0.78 mg/dL (ref 0.61–1.24)
GFR, Estimated: >60 mL/min (ref >60)
Glucose, Bld: 130 mg/dL — ABNORMAL HIGH (ref 70–99)
Potassium: 3.9 mmol/L (ref 3.5–5.1)
Sodium: 142 mmol/L (ref 135–145)

## 2022-10-27 LAB — D-DIMER, QUANTITATIVE: D-Dimer, Quant: 0.3 ug/mL-FEU (ref 0.00–0.50)

## 2022-10-27 LAB — SARS CORONAVIRUS 2 BY RT PCR: SARS Coronavirus 2 by RT PCR: NEGATIVE

## 2022-10-27 MED ORDER — IPRATROPIUM-ALBUTEROL 0.5-2.5 (3) MG/3ML IN SOLN
3.0000 mL | Freq: Once | RESPIRATORY_TRACT | Status: AC
  Administered 2022-10-27: 3 mL via RESPIRATORY_TRACT
  Filled 2022-10-27: qty 3

## 2022-10-27 MED ORDER — PREDNISONE 10 MG PO TABS: ORAL_TABLET | ORAL | 0 refills | Status: AC

## 2022-10-27 MED ORDER — METHYLPREDNISOLONE SODIUM SUCC 125 MG IJ SOLR
125.0000 mg | INTRAMUSCULAR | Status: AC
  Administered 2022-10-27: 125 mg via INTRAVENOUS
  Filled 2022-10-27: qty 2

## 2022-10-27 MED ORDER — ALBUTEROL SULFATE (2.5 MG/3ML) 0.083% IN NEBU
5.0000 mg | INHALATION_SOLUTION | Freq: Once | RESPIRATORY_TRACT | Status: AC
  Administered 2022-10-27: 5 mg via RESPIRATORY_TRACT
  Filled 2022-10-27: qty 6

## 2022-10-27 MED ORDER — AMOXICILLIN-POT CLAVULANATE 875-125 MG PO TABS: 1.0000 | ORAL_TABLET | Freq: Two times a day (BID) | ORAL | 0 refills | Status: AC

## 2022-10-27 MED ORDER — MAGNESIUM SULFATE 2 GM/50ML IV SOLN
2.0000 g | INTRAVENOUS | Status: AC
  Administered 2022-10-27: 2 g via INTRAVENOUS
  Filled 2022-10-27: qty 50

## 2022-10-27 NOTE — ED Provider Notes (Signed)
[facility] Provider Note    Event Date/Time   First MD Initiated Contact with Patient 10/27/22 1135     (approximate)   History   Chief Complaint: Weakness   HPI  Leslie Sims is a 71 y.o. male with a history of COPD on 3 L nasal cannula at home, hypertension, esophageal cancer, GERD, heart failure who comes the ED complaining of shortness of breath and wheezing that started this morning.  Denies chest pain fever vomiting or cough.  Feels like usual respiratory issues.  Tried using his nebulizer at home without improvement so he called EMS.  No pleuritic pain.     Physical Exam   Triage Vital Signs: ED Triage Vitals  Enc Vitals Group     BP 10/27/22 1135 121/72     Pulse Rate 10/27/22 1130 (!) 102     Resp 10/27/22 1130 16     Temp 10/27/22 1130 99.4 F (37.4 C)     Temp Source 10/27/22 1130 Oral     SpO2 10/27/22 1130 95 %     Weight --      Height 10/27/22 1133 '5\' 10"'$  (1.778 m)     Head Circumference --      Peak Flow --      Pain Score 10/27/22 1132 1     Pain Loc --      Pain Edu? --      Excl. in [place]? --     Most recent vital signs: Vitals:   10/27/22 1130 10/27/22 1135  BP:  121/72  Pulse: (!) 102 98  Resp: 16 20  Temp: 99.4 F (37.4 C)   SpO2: 95% 95%    General: Awake, no distress.  CV:  Good peripheral perfusion.  Mild tachycardia rate 100. Resp:  Normal effort.  Prolonged expiratory phase.  Diffuse expiratory wheezing.  Quiet breath sounds. Abd:  No distention.  Soft nontender Other:  No lower extremity edema or calf tenderness.  Moist oral mucosa   ED Results / Procedures / Treatments   Labs (all labs ordered are listed, but only abnormal results are displayed) Labs Reviewed  BASIC METABOLIC PANEL - Abnormal; Notable for the following components:      Result Value   Glucose, Bld 130 (*)    All other components within normal limits  CBC WITH DIFFERENTIAL/PLATELET - Abnormal; Notable for the following  components:   Hemoglobin 12.9 (*)    Lymphs Abs 0.5 (*)    All other components within normal limits  SARS CORONAVIRUS 2 BY RT PCR  D-DIMER, QUANTITATIVE     EKG Interpreted by me Sinus rhythm rate of 99.  Normal axis, normal intervals.  Left bundle branch block.  No acute ischemic changes.   RADIOLOGY Chest x-ray interpreted by me, appears unremarkable.  Negative for edema or consolidation.  Radiology report reviewed.   PROCEDURES:  Procedures   MEDICATIONS ORDERED IN ED: Medications  methylPREDNISolone sodium succinate (SOLU-MEDROL) 125 mg/2 mL injection 125 mg (125 mg Intravenous Given 10/27/22 1212)  ipratropium-albuterol (DUONEB) 0.5-2.5 (3) MG/3ML nebulizer solution 3 mL (3 mLs Nebulization Given 10/27/22 1211)  albuterol (PROVENTIL) (2.5 MG/3ML) 0.083% nebulizer solution 5 mg (5 mg Nebulization Given 10/27/22 1211)  magnesium sulfate IVPB 2 g 50 mL (0 g Intravenous Stopped 10/27/22 1323)     IMPRESSION / MDM / ASSESSMENT AND PLAN / ED COURSE  I reviewed the triage vital signs and the nursing notes.  Differential diagnosis includes, but is not limited to, COPD exacerbation, pleural effusion, pulmonary edema, pneumonia, AKI, anemia, electrolyte abnormality  Patient's presentation is most consistent with acute presentation with potential threat to life or bodily function.  patient presents with shortness of breath, wheezing, consistent with COPD exacerbation.  With his history and slight tachycardia, D-dimer obtained which is negative.  No further PE work-up needed.  COVID test is negative.  If symptomatically improved, he can be discharged to follow-up with primary care.   ----------------------------------------- 1:45 PM on 10/27/2022 ----------------------------------------- Patient feeling much better.  Repeat auscultation reveals improved air movement bilaterally.  Improved E to I ratio.  Resolved expiratory wheezing.  Patient feels  comfortable with discharge and outpatient management.  He was recently on a short course of prednisone and doxycycline so I will prescribe Augmentin and longer prednisone taper.      FINAL CLINICAL IMPRESSION(S) / ED DIAGNOSES   Final diagnoses:  COPD exacerbation ([place])  Chronic respiratory failure with hypoxia ([place])     Rx / DC Orders   ED Discharge Orders          Ordered    amoxicillin-clavulanate (AUGMENTIN) 875-125 MG tablet  2 times daily        10/27/22 1345    predniSONE (DELTASONE) 10 MG tablet  Daily        10/27/22 1345             Note:  This document was prepared using Dragon voice recognition software and may include unintentional dictation errors.   Carrie Mew, MD 10/27/22 1346

## 2022-10-27 NOTE — Telephone Encounter (Signed)
I notified the patient. He will go to the ED. I have notified Dr. Patsey Berthold. Nothing further needed.

## 2022-10-27 NOTE — Telephone Encounter (Signed)
With the discomfort in his chest he needs to be seen in the emergency room.  Also because he has failed outpatient therapy he needs to be evaluated in the emergency room.  These issues are likely now not related necessarily to his lungs but could be related to his esophagus issues or to his heart issues.

## 2022-10-27 NOTE — ED Triage Notes (Signed)
Pt felt SOB and weakness. Audible wheezing. Pt took two nebulizer and called EMS after still feeling SOB.

## 2022-10-27 NOTE — Telephone Encounter (Signed)
I spoke with the patient. He is having constant SOB and "discomfort" in his chest. No fever, chills or sweats. No cough. Wheezing. He has finished the Prednisone and Z-Pack you gave him on 10/14/2022. He said they did no help.

## 2022-11-08 ENCOUNTER — Encounter
Admission: RE | Disposition: A | Discharge status: Home / Self Care | Payer: Self-pay | Source: Home / Self Care | Attending: Gastroenterology

## 2022-11-08 ENCOUNTER — Ambulatory Visit: Payer: Medicare Other | Admitting: Certified Registered"

## 2022-11-08 ENCOUNTER — Ambulatory Visit
Admission: RE | Admit: 2022-11-08 | Discharge: 2022-11-08 | Disposition: A | Discharge status: Home / Self Care | Payer: Medicare Other | Attending: Gastroenterology | Admitting: Gastroenterology

## 2022-11-08 DIAGNOSIS — E785 Hyperlipidemia, unspecified: Secondary | ICD-10-CM | POA: Diagnosis not present

## 2022-11-08 DIAGNOSIS — Z8711 Personal history of peptic ulcer disease: Secondary | ICD-10-CM | POA: Insufficient documentation

## 2022-11-08 DIAGNOSIS — Z9981 Dependence on supplemental oxygen: Secondary | ICD-10-CM | POA: Diagnosis not present

## 2022-11-08 DIAGNOSIS — I252 Old myocardial infarction: Secondary | ICD-10-CM | POA: Insufficient documentation

## 2022-11-08 DIAGNOSIS — I110 Hypertensive heart disease with heart failure: Secondary | ICD-10-CM | POA: Insufficient documentation

## 2022-11-08 DIAGNOSIS — I255 Ischemic cardiomyopathy: Secondary | ICD-10-CM | POA: Insufficient documentation

## 2022-11-08 DIAGNOSIS — K219 Gastro-esophageal reflux disease without esophagitis: Secondary | ICD-10-CM | POA: Diagnosis not present

## 2022-11-08 DIAGNOSIS — Z955 Presence of coronary angioplasty implant and graft: Secondary | ICD-10-CM | POA: Insufficient documentation

## 2022-11-08 DIAGNOSIS — C159 Malignant neoplasm of esophagus, unspecified: Secondary | ICD-10-CM | POA: Insufficient documentation

## 2022-11-08 DIAGNOSIS — Z86010 Personal history of colonic polyps: Secondary | ICD-10-CM | POA: Insufficient documentation

## 2022-11-08 DIAGNOSIS — K449 Diaphragmatic hernia without obstruction or gangrene: Secondary | ICD-10-CM | POA: Insufficient documentation

## 2022-11-08 DIAGNOSIS — J449 Chronic obstructive pulmonary disease, unspecified: Secondary | ICD-10-CM | POA: Insufficient documentation

## 2022-11-08 DIAGNOSIS — I5022 Chronic systolic (congestive) heart failure: Secondary | ICD-10-CM | POA: Insufficient documentation

## 2022-11-08 DIAGNOSIS — I2510 Atherosclerotic heart disease of native coronary artery without angina pectoris: Secondary | ICD-10-CM | POA: Diagnosis not present

## 2022-11-08 DIAGNOSIS — K208 Other esophagitis without bleeding: Secondary | ICD-10-CM | POA: Diagnosis not present

## 2022-11-08 DIAGNOSIS — Y842 Radiological procedure and radiotherapy as the cause of abnormal reaction of the patient, or of later complication, without mention of misadventure at the time of the procedure: Secondary | ICD-10-CM | POA: Diagnosis not present

## 2022-11-08 HISTORY — PX: ESOPHAGOGASTRODUODENOSCOPY (EGD) WITH PROPOFOL: SHX5813

## 2022-11-08 SURGERY — ESOPHAGOGASTRODUODENOSCOPY (EGD) WITH PROPOFOL
Anesthesia: General

## 2022-11-08 MED ORDER — LIDOCAINE HCL (CARDIAC) PF 100 MG/5ML IV SOSY
PREFILLED_SYRINGE | INTRAVENOUS | Status: DC | PRN
  Administered 2022-11-08: 50 mg via INTRAVENOUS

## 2022-11-08 MED ORDER — SODIUM CHLORIDE 0.9 % IV SOLN: INTRAVENOUS | Status: DC

## 2022-11-08 MED ORDER — PROPOFOL 1000 MG/100ML IV EMUL
INTRAVENOUS | Status: AC
  Filled 2022-11-08: qty 100

## 2022-11-08 MED ORDER — DEXMEDETOMIDINE HCL IN NACL 200 MCG/50ML IV SOLN
INTRAVENOUS | Status: DC | PRN
  Administered 2022-11-08: 12 ug via INTRAVENOUS

## 2022-11-08 MED ORDER — PROPOFOL 10 MG/ML IV BOLUS
INTRAVENOUS | Status: DC | PRN
  Administered 2022-11-08: 70 mg via INTRAVENOUS

## 2022-11-08 MED ORDER — BUTAMBEN-TETRACAINE-BENZOCAINE 2-2-14 % EX AERO
INHALATION_SPRAY | CUTANEOUS | Status: DC | PRN
  Administered 2022-11-08: 3 via TOPICAL

## 2022-11-08 NOTE — Anesthesia Preprocedure Evaluation (Signed)
Anesthesia Evaluation  Patient identified by MRN, date of birth, ID band Patient awake    Reviewed: Allergy & Precautions, NPO status , Patient's Chart, lab work & pertinent test results  Airway Mallampati: III  TM Distance: >3 FB Neck ROM: Full    Dental  (+) Partial Upper   Pulmonary neg pulmonary ROS, COPD,  COPD inhaler, former smoker   Pulmonary exam normal  + decreased breath sounds      Cardiovascular Exercise Tolerance: Poor hypertension, Pt. on medications + CAD, + Past MI and +CHF  negative cardio ROS Normal cardiovascular exam Rhythm:Regular Rate:Normal     Neuro/Psych  Headaches   Depression    negative neurological ROS  negative psych ROS   GI/Hepatic negative GI ROS, Neg liver ROS, PUD,GERD  Medicated,,  Endo/Other  negative endocrine ROS    Renal/GU negative Renal ROS  negative genitourinary   Musculoskeletal   Abdominal  (+) + obese  Peds negative pediatric ROS (+)  Hematology negative hematology ROS (+) Blood dyscrasia, anemia   Anesthesia Other Findings Past Medical History: No date: Allergic rhinitis No date: Anxiety No date: Back pain No date: Cancer [facility])     Comment:  prostate No date: CHF (congestive heart failure) ([place]) 08/2012: Colon polyps     Comment:  colonoscopy; multiple colon polyps; repeat colonoscopy               in one year. No date: Complication of anesthesia No date: COPD (chronic obstructive pulmonary disease) ([place])     Comment:  a. on home O2. 11/2009: Coronary artery disease     Comment:  a. late presenting ant MI; b. LHC 100% pLAD s/p PCI/DES,              99% mRCA s/p PCI/DES, EF 35%; c. nuclear stress test               05/13: prior ant/inf infarcts w/o ischemia, EF 43%; d.               LHC 02/14: widely patent stents with no other obs dz, EF               40%; e. LHC 11/17: LM nl, mLAD 10%, patent LAD stent,               dLAD 20%, p-mRCA 10%, mRCA 40%, patent  RCA stent, EF               35-45%  No date: Depression No date: Dysphagia No date: Family history of adverse reaction to anesthesia No date: GERD (gastroesophageal reflux disease) No date: OJJKKXFG(182.9) No date: Helicobacter pylori (H. pylori) No date: Hemorrhoids No date: Hernia     Comment:  inguinal No date: HFrEF (heart failure with reduced ejection fraction) ([place])     Comment:  a. 10/2016 LV gram: EF 35-45%; b. 06/2018 Echo: EF               40-45%. c. 01/2021 Echo: EF 40-45% No date: Hyperlipidemia No date: Hypertension No date: Insomnia No date: Ischemic cardiomyopathy     Comment:  a. 10/2016 LV gram: EF 35-45%; b. 06/2018 Echo: EF               40-45%. No date: Myalgia No date: Oxygen dependent No date: Peptic ulcer No date: Pulmonary nodules 11/2009: ST elevation (STEMI) myocardial infarction involving left  anterior descending coronary artery (HCC)     Comment:  a. s/p PCI to the LAD  2000: Status post dilation of esophageal narrowing No date: Thickening of esophagus No date: Tinea pedis No date: Wears dentures     Comment:  partial upper  Past Surgical History: No date:  coronary stents 12/20/2012: Admission     Comment:  COPD exacerbation.  [place]. No date: CARDIAC CATHETERIZATION 02/05/2013: CARDIAC CATHETERIZATION     Comment:  [place] 09/19/2013: CARDIAC CATHETERIZATION     Comment:  [facility] : patent stents with no change in anatomy. EF: 40$ 11/12/2016: CARDIAC CATHETERIZATION; Left     Comment:  Procedure: Left Heart Cath and Coronary Angiography;                Surgeon: Wellington Hampshire, MD;  Location: [facility]               CV LAB;  Service: Cardiovascular;  Laterality: Left; No date: COLONOSCOPY 05/18/2016: COLONOSCOPY WITH PROPOFOL; N/A     Comment:  Procedure: COLONOSCOPY WITH PROPOFOL;  Surgeon: Lucilla Lame, MD;  Location: [facility] ENDOSCOPY;  Service: Endoscopy;              Laterality: N/A; 03/19/2020: COLONOSCOPY WITH PROPOFOL; N/A      Comment:  Procedure: COLONOSCOPY WITH PROPOFOL;  Surgeon: Lin Landsman, MD;  Location: [facility] ENDOSCOPY;  Service:               Gastroenterology;  Laterality: N/A; 09/19/2009: CORONARY ANGIOPLASTY WITH STENT PLACEMENT     Comment:  LAD 3.0 X23 mm Xience DES, RCA: 4.0 X 15 mm Xience DES 10/27/2017: ELECTROMAGNETIC NAVIGATION BROCHOSCOPY; N/A     Comment:  Procedure: ELECTROMAGNETIC NAVIGATION BRONCHOSCOPY;                Surgeon: Flora Lipps, MD;  Location: [facility] ORS;  Service:              Cardiopulmonary;  Laterality: N/A; No date: ESOPHAGEAL DILATION 12/20/2006: ESOPHAGOGASTRODUODENOSCOPY 08/20/2012: ESOPHAGOGASTRODUODENOSCOPY 04/19/2022: ESOPHAGOGASTRODUODENOSCOPY (EGD) WITH PROPOFOL; N/A     Comment:  Procedure: ESOPHAGOGASTRODUODENOSCOPY (EGD) WITH               PROPOFOL;  Surgeon: [name], MD;  Location:               [facility] ENDOSCOPY;  Service: Endoscopy;  Laterality: N/A; 04/27/2022: ESOPHAGOGASTRODUODENOSCOPY (EGD) WITH PROPOFOL; N/A     Comment:  Procedure: ESOPHAGOGASTRODUODENOSCOPY (EGD) WITH               PROPOFOL;  Surgeon: [name], MD;  Location:               [facility] ENDOSCOPY;  Service: Endoscopy;  Laterality: N/A; 05/20/2022: EUS; N/A     Comment:  Procedure: UPPER ENDOSCOPIC ULTRASOUND (EUS) LINEAR;                Surgeon: Reita Cliche, MD;  Location: [facility] ENDOSCOPY;              Service: Gastroenterology;  Laterality: N/A;  [facility] 07/20/2012: HERNIA REPAIR     Comment:  L inguinal hernia repair No date: PENILE PROSTHESIS IMPLANT  BMI    Body Mass Index: 27.84 kg/m      Reproductive/Obstetrics negative OB ROS  Anesthesia Physical Anesthesia Plan  ASA: 4  Anesthesia Plan: General   Post-op Pain Management:    Induction: Intravenous  PONV Risk Score and Plan: Propofol infusion and TIVA  Airway Management Planned: Natural Airway  Additional Equipment:    Intra-op Plan:   Post-operative Plan:   Informed Consent: I have reviewed the patients History and Physical, chart, labs and discussed the procedure including the risks, benefits and alternatives for the proposed anesthesia with the patient or authorized representative who has indicated his/her understanding and acceptance.     Dental Advisory Given  Plan Discussed with: CRNA and Surgeon  Anesthesia Plan Comments:        Anesthesia Quick Evaluation

## 2022-11-08 NOTE — Interval H&P Note (Signed)
History and Physical Interval Note:  11/08/2022 10:28 AM  Leslie Sims  has presented today for surgery, with the diagnosis of esophageal cancer.  The various methods of treatment have been discussed with the patient and family. After consideration of risks, benefits and other options for treatment, the patient has consented to  Procedure(s): ESOPHAGOGASTRODUODENOSCOPY (EGD) WITH PROPOFOL (N/A) as a surgical intervention.  The patient's history has been reviewed, patient examined, no change in status, stable for surgery.  I have reviewed the patient's chart and labs.  Questions were answered to the patient's satisfaction.     [name]  Ok to proceed with EGD

## 2022-11-08 NOTE — Anesthesia Postprocedure Evaluation (Signed)
Anesthesia Post Note  Patient: Leslie Sims  Procedure(s) Performed: ESOPHAGOGASTRODUODENOSCOPY (EGD) WITH PROPOFOL  Patient location during evaluation: PACU Anesthesia Type: General Level of consciousness: awake and awake and alert Pain management: satisfactory to patient Vital Signs Assessment: post-procedure vital signs reviewed and stable Respiratory status: nonlabored ventilation and respiratory function stable Cardiovascular status: stable Anesthetic complications: no  No notable events documented.   Last Vitals:  Vitals:   11/08/22 0952 11/08/22 1045  BP: (!) 147/89 126/76  Pulse: 73   Resp: 18   Temp: (!) 36.1 C (!) 36.1 C  SpO2: 100%     Last Pain:  Vitals:   11/08/22 1105  TempSrc:   PainSc: 0-No pain                 VAN STAVEREN,Manley Fason

## 2022-11-08 NOTE — H&P (Signed)
Outpatient short stay form Pre-procedure 11/08/2022  Leslie Rubenstein, MD  Primary Physician: [name], MD  Reason for visit:  History of esophageal cancer  History of present illness:    71 y/o gentleman with severe COPD here for EGD for recent history of squamous cell carcinoma s/p radiation/chemo here to assess for any residual disease. Dysphagia has improved. No blood thinners. PET scan with possible residual disease versus esophagitis.    Current Facility-Administered Medications:    0.9 %  sodium chloride infusion, , Intravenous, Continuous, Vardaan Depascale, Hilton Cork, MD, Last Rate: 20 mL/hr at 11/08/22 1006, New Bag at 11/08/22 1006  Medications Prior to Admission  Medication Sig Dispense Refill Last Dose   LORazepam (ATIVAN) 1 MG tablet Take 1 tablet (1 mg total) by mouth every 4 (four) hours while awake. (Patient taking differently: Take 1 mg by mouth 3 (three) times daily.) 30 tablet 0 11/08/2022   albuterol (VENTOLIN HFA) 108 (90 Base) MCG/ACT inhaler TAKE 2 PUFFS BY MOUTH EVERY 6 HOURS AS NEEDED FOR WHEEZE OR SHORTNESS OF BREATH 18 each 4    aspirin 81 MG tablet Take 81 mg by mouth daily.      atorvastatin (LIPITOR) 80 MG tablet Take 1 tablet (80 mg total) by mouth daily. 90 tablet 3    budesonide (PULMICORT) 0.5 MG/2ML nebulizer solution Take 2 mLs (0.5 mg total) by nebulization in the morning and at bedtime. 320 mL 12    docusate sodium (COLACE) 100 MG capsule Take 1 capsule (100 mg total) by mouth 2 (two) times daily. 60 capsule 0    esomeprazole (NEXIUM) 40 MG capsule TAKE ONE CAPSULE BY MOUTH ONCE DAILY 30 capsule 4    fluticasone (FLONASE) 50 MCG/ACT nasal spray Place 2 sprays into both nostrils daily.      furosemide (LASIX) 20 MG tablet Take 1 tablet (20 mg total) by mouth daily. 30 tablet 2    ipratropium-albuterol (DUONEB) 0.5-2.5 (3) MG/3ML SOLN Take 3 mLs by nebulization 2 (two) times daily.      isosorbide mononitrate (IMDUR) 30 MG 24 hr tablet Take 1 tablet  (30 mg total) by mouth daily. 30 tablet 2    ketoconazole (NIZORAL) 2 % cream Apply topically 2 (two) times daily.      loratadine (CLARITIN) 10 MG tablet Take 10 mg by mouth daily.      losartan (COZAAR) 25 MG tablet Take 25 mg by mouth daily.      mirtazapine (REMERON) 45 MG tablet Take 45 mg by mouth at bedtime.      Multiple Vitamin (MULTIVITAMIN) capsule Take 1 capsule by mouth daily.      nitroGLYCERIN (NITROSTAT) 0.4 MG SL tablet Place 1 tablet (0.4 mg total) under the tongue every 5 (five) minutes as needed. 25 tablet 3    OXYGEN Inhale 2.5 L into the lungs daily.      polyethylene glycol (MIRALAX) 17 g packet Take 17 g by mouth daily as needed.      predniSONE (DELTASONE) 10 MG tablet Take 5 tablets (50 mg total) by mouth daily for 3 days, THEN 4 tablets (40 mg total) daily for 3 days, THEN 3 tablets (30 mg total) daily for 3 days, THEN 2 tablets (20 mg total) daily for 3 days, THEN 1 tablet (10 mg total) daily for 3 days. 45 tablet 0    Spacer/Aero-Holding Chambers (AEROCHAMBER MV) inhaler Use as instructed 1 each 2    sucralfate (CARAFATE) 1 g tablet Take 1 g by mouth  2 (two) times daily with a meal.        Allergies  Allergen Reactions   Prozac [Fluoxetine Hcl] Shortness Of Breath   Hydroxyzine    Bupropion Other (See Comments)    "Makes me feel funny" Other reaction(s): Other (See Comments) Unknown/"made me feel real funny" "Makes me feel funny" "Makes me feel funny" Other reaction(s): Other (See Comments) Unknown/"made me feel real funny" "Makes me feel funny"   Effexor Xr [Venlafaxine Hcl Er] Other (See Comments)    "Makes me feel funny"     Past Medical History:  Diagnosis Date   Allergic rhinitis    Anxiety    Back pain    Cancer (HCC)    prostate   CHF (congestive heart failure) ([place])    Colon polyps 08/2012   colonoscopy; multiple colon polyps; repeat colonoscopy in one year.   Complication of anesthesia    COPD (chronic obstructive pulmonary disease) ([place])     a. on home O2.   Coronary artery disease 11/2009   a. late presenting ant MI; b. LHC 100% pLAD s/p PCI/DES, 99% mRCA s/p PCI/DES, EF 35%; c. nuclear stress test 05/13: prior ant/inf infarcts w/o ischemia, EF 43%; d. LHC 02/14: widely patent stents with no other obs dz, EF 40%; e. LHC 11/17: LM nl, mLAD 10%, patent LAD stent, dLAD 20%, p-mRCA 10%, mRCA 40%, patent RCA stent, EF 35-45%    Depression    Dysphagia    Family history of adverse reaction to anesthesia    GERD (gastroesophageal reflux disease)    HOZYYQMG(500.3)    Helicobacter pylori (H. pylori)    Hemorrhoids    Hernia    inguinal   HFrEF (heart failure with reduced ejection fraction) ([place])    a. 10/2016 LV gram: EF 35-45%; b. 06/2018 Echo: EF 40-45%. c. 01/2021 Echo: EF 40-45%   Hyperlipidemia    Hypertension    Insomnia    Ischemic cardiomyopathy    a. 10/2016 LV gram: EF 35-45%; b. 06/2018 Echo: EF 40-45%.   Myalgia    Oxygen dependent    Peptic ulcer    Pulmonary nodules    ST elevation (STEMI) myocardial infarction involving left anterior descending coronary artery ([place]) 11/2009   a. s/p PCI to the LAD   Status post dilation of esophageal narrowing 2000   Thickening of esophagus    Tinea pedis    Wears dentures    partial upper    Review of systems:  Otherwise negative.    Physical Exam  Gen: Alert, oriented. Appears stated age.  HEENT: PERRLA. Lungs: No respiratory distress CV: RRR Abd: soft, benign, no masses Ext: No edema    Planned procedures: Proceed with EGD. The patient understands the nature of the planned procedure, indications, risks, alternatives and potential complications including but not limited to bleeding, infection, perforation, damage to internal organs and possible oversedation/side effects from anesthesia. The patient agrees and gives consent to proceed.  Please refer to procedure notes for findings, recommendations and patient disposition/instructions.     Leslie Rubenstein,  MD [facility] Gastroenterology

## 2022-11-08 NOTE — Transfer of Care (Signed)
Immediate Anesthesia Transfer of Care Note  Patient: Leslie Sims  Procedure(s) Performed: ESOPHAGOGASTRODUODENOSCOPY (EGD) WITH PROPOFOL  Patient Location: PACU and Endoscopy Unit  Anesthesia Type:General  Level of Consciousness: awake  Airway & Oxygen Therapy: Patient Spontanous Breathing and Patient connected to nasal cannula oxygen  Post-op Assessment: Report given to RN and Post -op Vital signs reviewed and stable  Post vital signs: Reviewed and stable  Last Vitals:  Vitals Value Taken Time  BP 126/76 11/08/22 1045  Temp    Pulse 66 11/08/22 1045  Resp 17 11/08/22 1045  SpO2 97 % 11/08/22 1045  Vitals shown include unvalidated device data.  Last Pain:  Vitals:   11/08/22 0952  TempSrc: Temporal  PainSc: 2          Complications: No notable events documented.

## 2022-11-08 NOTE — Op Note (Signed)
[facility] Gastroenterology Patient Name: Leslie Sims Procedure Date: 11/08/2022 10:22 AM MRN: [id number] Account #: [id number] Date of Birth: Aug 13, 1951 Admit Type: Outpatient Age: 71 Room: [facility] ENDO ROOM 3 Gender: Male Note Status: Finalized Instrument Name: Upper Endoscope [id number] Procedure:             Upper GI endoscopy Indications:           Malignant esophageal squamous cell carcinoma Providers:             Andrey Farmer MD, MD Referring MD:          No Local Md, MD (Referring MD) Medicines:             Monitored Anesthesia Care Complications:         No immediate complications. Estimated blood loss:                         Minimal. Procedure:             Pre-Anesthesia Assessment:                        - Prior to the procedure, a History and Physical was                         performed, and patient medications and allergies were                         reviewed. The patient is competent. The risks and                         benefits of the procedure and the sedation options and                         risks were discussed with the patient. All questions                         were answered and informed consent was obtained.                         Patient identification and proposed procedure were                         verified by the physician, the nurse, the                         anesthesiologist, the anesthetist and the technician                         in the endoscopy suite. Mental Status Examination:                         alert and oriented. Airway Examination: normal                         oropharyngeal airway and neck mobility. Respiratory                         Examination: clear to auscultation. CV Examination:  normal. Prophylactic Antibiotics: The patient does not                         require prophylactic antibiotics. Prior                         Anticoagulants: The patient has taken no  anticoagulant                         or antiplatelet agents. ASA Grade Assessment: III - A                         patient with severe systemic disease. After reviewing                         the risks and benefits, the patient was deemed in                         satisfactory condition to undergo the procedure. The                         anesthesia plan was to use monitored anesthesia care                         (MAC). Immediately prior to administration of                         medications, the patient was re-assessed for adequacy                         to receive sedatives. The heart rate, respiratory                         rate, oxygen saturations, blood pressure, adequacy of                         pulmonary ventilation, and response to care were                         monitored throughout the procedure. The physical                         status of the patient was re-assessed after the                         procedure.                        After obtaining informed consent, the endoscope was                         passed under direct vision. Throughout the procedure,                         the patient's blood pressure, pulse, and oxygen                         saturations were monitored continuously. The Endoscope  was introduced through the mouth, and advanced to the                         second part of duodenum. The upper GI endoscopy was                         accomplished without difficulty. The patient tolerated                         the procedure well. Findings:      Non-severe esophagitis with no bleeding was found. Biopsies were taken       with a cold forceps for histology. Estimated blood loss was minimal.      A small hiatal hernia was present.      The entire examined stomach was normal.      The examined duodenum was normal. 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                          Biopsied.                        - Small hiatal hernia.                        - Normal stomach.                        - Normal examined duodenum. Recommendation:        - Discharge patient to home.                        - Resume previous diet.                        - Continue present medications.                        - Await pathology results.                        - Return to referring physician as previously                         scheduled. Procedure Code(s):     --- Professional ---                        [phone], Esophagogastroduodenoscopy, flexible,                         transoral; with biopsy, single or multiple Diagnosis Code(s):     --- Professional ---                        T66.XXXA, Radiation sickness, unspecified, initial                         encounter                        K20.80, Other esophagitis without bleeding  K44.9, Diaphragmatic hernia without obstruction or                         gangrene                        C15.9, Malignant neoplasm of esophagus, unspecified CPT copyright 2022 [employer]. All rights reserved. The codes documented in this report are preliminary and upon coder review may  be revised to meet current compliance requirements. Andrey Farmer MD, MD 11/08/2022 10:45:43 AM Number of Addenda: 0 Note Initiated On: 11/08/2022 10:22 AM Estimated Blood Loss:  Estimated blood loss was minimal.      [facility]

## 2022-11-08 NOTE — Anesthesia Procedure Notes (Signed)
Procedure Name: MAC Date/Time: 11/08/2022 10:33 AM  Performed by: Biagio Borg, CRNAPre-anesthesia Checklist: Patient identified, Emergency Drugs available, Suction available, Patient being monitored and Timeout performed Patient Re-evaluated:Patient Re-evaluated prior to induction Oxygen Delivery Method: Simple face mask Induction Type: IV induction Placement Confirmation: positive ETCO2 and CO2 detector

## 2022-11-09 ENCOUNTER — Encounter: Payer: Self-pay | Admitting: Gastroenterology

## 2022-11-10 LAB — SURGICAL PATHOLOGY

## 2022-11-13 ENCOUNTER — Inpatient Hospital Stay
Admission: EM | Admit: 2022-11-13 | Discharge: 2022-11-16 | DRG: 191 | Disposition: A | Discharge status: Home Health | Payer: Medicare Other | Attending: Internal Medicine | Admitting: Internal Medicine

## 2022-11-13 ENCOUNTER — Other Ambulatory Visit: Payer: Self-pay

## 2022-11-13 ENCOUNTER — Emergency Department: Payer: Medicare Other

## 2022-11-13 DIAGNOSIS — Z8719 Personal history of other diseases of the digestive system: Secondary | ICD-10-CM

## 2022-11-13 DIAGNOSIS — Z8249 Family history of ischemic heart disease and other diseases of the circulatory system: Secondary | ICD-10-CM

## 2022-11-13 DIAGNOSIS — Y842 Radiological procedure and radiotherapy as the cause of abnormal reaction of the patient, or of later complication, without mention of misadventure at the time of the procedure: Secondary | ICD-10-CM | POA: Diagnosis present

## 2022-11-13 DIAGNOSIS — Z7951 Long term (current) use of inhaled steroids: Secondary | ICD-10-CM

## 2022-11-13 DIAGNOSIS — F32A Depression, unspecified: Secondary | ICD-10-CM | POA: Diagnosis present

## 2022-11-13 DIAGNOSIS — K208 Other esophagitis without bleeding: Secondary | ICD-10-CM | POA: Diagnosis present

## 2022-11-13 DIAGNOSIS — J441 Chronic obstructive pulmonary disease with (acute) exacerbation: Secondary | ICD-10-CM | POA: Diagnosis not present

## 2022-11-13 DIAGNOSIS — I5032 Chronic diastolic (congestive) heart failure: Secondary | ICD-10-CM | POA: Diagnosis not present

## 2022-11-13 DIAGNOSIS — J9611 Chronic respiratory failure with hypoxia: Secondary | ICD-10-CM | POA: Diagnosis not present

## 2022-11-13 DIAGNOSIS — Z801 Family history of malignant neoplasm of trachea, bronchus and lung: Secondary | ICD-10-CM

## 2022-11-13 DIAGNOSIS — F419 Anxiety disorder, unspecified: Secondary | ICD-10-CM | POA: Diagnosis present

## 2022-11-13 DIAGNOSIS — Z87891 Personal history of nicotine dependence: Secondary | ICD-10-CM

## 2022-11-13 DIAGNOSIS — G4700 Insomnia, unspecified: Secondary | ICD-10-CM | POA: Diagnosis present

## 2022-11-13 DIAGNOSIS — F341 Dysthymic disorder: Secondary | ICD-10-CM | POA: Diagnosis present

## 2022-11-13 DIAGNOSIS — I2510 Atherosclerotic heart disease of native coronary artery without angina pectoris: Secondary | ICD-10-CM | POA: Diagnosis not present

## 2022-11-13 DIAGNOSIS — I10 Essential (primary) hypertension: Secondary | ICD-10-CM | POA: Diagnosis present

## 2022-11-13 DIAGNOSIS — Z8711 Personal history of peptic ulcer disease: Secondary | ICD-10-CM

## 2022-11-13 DIAGNOSIS — Z79899 Other long term (current) drug therapy: Secondary | ICD-10-CM

## 2022-11-13 DIAGNOSIS — Z9221 Personal history of antineoplastic chemotherapy: Secondary | ICD-10-CM

## 2022-11-13 DIAGNOSIS — I110 Hypertensive heart disease with heart failure: Secondary | ICD-10-CM | POA: Diagnosis present

## 2022-11-13 DIAGNOSIS — R0602 Shortness of breath: Secondary | ICD-10-CM

## 2022-11-13 DIAGNOSIS — Z20822 Contact with and (suspected) exposure to covid-19: Secondary | ICD-10-CM | POA: Diagnosis present

## 2022-11-13 DIAGNOSIS — I255 Ischemic cardiomyopathy: Secondary | ICD-10-CM | POA: Diagnosis present

## 2022-11-13 DIAGNOSIS — Z86010 Personal history of colonic polyps: Secondary | ICD-10-CM

## 2022-11-13 DIAGNOSIS — Z923 Personal history of irradiation: Secondary | ICD-10-CM

## 2022-11-13 DIAGNOSIS — K219 Gastro-esophageal reflux disease without esophagitis: Secondary | ICD-10-CM | POA: Diagnosis present

## 2022-11-13 DIAGNOSIS — I5042 Chronic combined systolic (congestive) and diastolic (congestive) heart failure: Secondary | ICD-10-CM | POA: Diagnosis present

## 2022-11-13 DIAGNOSIS — C159 Malignant neoplasm of esophagus, unspecified: Secondary | ICD-10-CM | POA: Diagnosis present

## 2022-11-13 DIAGNOSIS — Z955 Presence of coronary angioplasty implant and graft: Secondary | ICD-10-CM

## 2022-11-13 DIAGNOSIS — Z7982 Long term (current) use of aspirin: Secondary | ICD-10-CM

## 2022-11-13 DIAGNOSIS — Z9981 Dependence on supplemental oxygen: Secondary | ICD-10-CM

## 2022-11-13 DIAGNOSIS — I252 Old myocardial infarction: Secondary | ICD-10-CM

## 2022-11-13 DIAGNOSIS — E785 Hyperlipidemia, unspecified: Secondary | ICD-10-CM | POA: Diagnosis present

## 2022-11-13 DIAGNOSIS — J309 Allergic rhinitis, unspecified: Secondary | ICD-10-CM | POA: Diagnosis present

## 2022-11-13 DIAGNOSIS — Z825 Family history of asthma and other chronic lower respiratory diseases: Secondary | ICD-10-CM

## 2022-11-13 LAB — BRAIN NATRIURETIC PEPTIDE: B Natriuretic Peptide: 31 pg/mL (ref 0.0–100.0)

## 2022-11-13 LAB — HEPATIC FUNCTION PANEL
ALT: 36 U/L (ref 0–44)
AST: 29 U/L (ref 15–41)
Albumin: 3.7 g/dL (ref 3.5–5.0)
Alkaline Phosphatase: 58 U/L (ref 38–126)
Bilirubin, Direct: <0.1 mg/dL (ref 0.0–0.2)
Total Bilirubin: 0.6 mg/dL (ref 0.3–1.2)
Total Protein: 6.3 g/dL — ABNORMAL LOW (ref 6.5–8.1)

## 2022-11-13 LAB — CBC
HCT: 41.1 % (ref 39.0–52.0)
Hemoglobin: 13.2 g/dL (ref 13.0–17.0)
MCH: 30.3 pg (ref 26.0–34.0)
MCHC: 32.1 g/dL (ref 30.0–36.0)
MCV: 94.5 fL (ref 80.0–100.0)
Platelets: 187 10*3/uL (ref 150–400)
RBC: 4.35 MIL/uL (ref 4.22–5.81)
RDW: 12.6 % (ref 11.5–15.5)
WBC: 5.7 10*3/uL (ref 4.0–10.5)
nRBC: 0 % (ref 0.0–0.2)

## 2022-11-13 LAB — BASIC METABOLIC PANEL
Anion gap: 6 (ref 5–15)
BUN: 17 mg/dL (ref 8–23)
CO2: 28 mmol/L (ref 22–32)
Calcium: 8.9 mg/dL (ref 8.9–10.3)
Chloride: 108 mmol/L (ref 98–111)
Creatinine, Ser: 0.72 mg/dL (ref 0.61–1.24)
GFR, Estimated: >60 mL/min (ref >60)
Glucose, Bld: 118 mg/dL — ABNORMAL HIGH (ref 70–99)
Potassium: 3.9 mmol/L (ref 3.5–5.1)
Sodium: 142 mmol/L (ref 135–145)

## 2022-11-13 LAB — RESP PANEL BY RT-PCR (FLU A&B, COVID) ARPGX2
Influenza A by PCR: NEGATIVE
Influenza B by PCR: NEGATIVE
SARS Coronavirus 2 by RT PCR: NEGATIVE

## 2022-11-13 LAB — TROPONIN I (HIGH SENSITIVITY)
Troponin I (High Sensitivity): 11 ng/L (ref <18)
Troponin I (High Sensitivity): 13 ng/L (ref <18)

## 2022-11-13 LAB — LIPASE, BLOOD: Lipase: 29 U/L (ref 11–51)

## 2022-11-13 MED ORDER — SODIUM CHLORIDE 0.9 % IV SOLN
1.0000 g | Freq: Once | INTRAVENOUS | Status: AC
  Administered 2022-11-13: 1 g via INTRAVENOUS
  Filled 2022-11-13: qty 10

## 2022-11-13 MED ORDER — ALBUTEROL SULFATE (2.5 MG/3ML) 0.083% IN NEBU: 2.5000 mg | INHALATION_SOLUTION | RESPIRATORY_TRACT | Status: DC | PRN

## 2022-11-13 MED ORDER — SODIUM CHLORIDE 0.9 % IV SOLN: 500.0000 mg | Freq: Once | INTRAVENOUS | Status: DC

## 2022-11-13 MED ORDER — METHYLPREDNISOLONE SODIUM SUCC 125 MG IJ SOLR
125.0000 mg | Freq: Two times a day (BID) | INTRAMUSCULAR | Status: AC
  Administered 2022-11-14 (×2): 125 mg via INTRAVENOUS
  Filled 2022-11-13 (×2): qty 2

## 2022-11-13 MED ORDER — SODIUM CHLORIDE 0.9% FLUSH
3.0000 mL | Freq: Two times a day (BID) | INTRAVENOUS | Status: DC
  Administered 2022-11-13 – 2022-11-16 (×6): 3 mL via INTRAVENOUS

## 2022-11-13 MED ORDER — MIRTAZAPINE 15 MG PO TABS
45.0000 mg | ORAL_TABLET | Freq: Every day | ORAL | Status: DC
  Administered 2022-11-13 – 2022-11-15 (×3): 45 mg via ORAL
  Filled 2022-11-13 (×3): qty 3

## 2022-11-13 MED ORDER — ONDANSETRON HCL 4 MG/2ML IJ SOLN: 4.0000 mg | Freq: Four times a day (QID) | INTRAMUSCULAR | Status: DC | PRN

## 2022-11-13 MED ORDER — ALUM & MAG HYDROXIDE-SIMETH 200-200-20 MG/5ML PO SUSP
15.0000 mL | ORAL | Status: DC | PRN
  Administered 2022-11-13: 15 mL via ORAL
  Filled 2022-11-13: qty 30

## 2022-11-13 MED ORDER — PANTOPRAZOLE SODIUM 40 MG IV SOLR
40.0000 mg | Freq: Once | INTRAVENOUS | Status: AC
  Administered 2022-11-13: 40 mg via INTRAVENOUS
  Filled 2022-11-13: qty 10

## 2022-11-13 MED ORDER — IPRATROPIUM-ALBUTEROL 0.5-2.5 (3) MG/3ML IN SOLN
3.0000 mL | RESPIRATORY_TRACT | Status: DC | PRN
  Administered 2022-11-13: 3 mL via RESPIRATORY_TRACT
  Filled 2022-11-13: qty 3

## 2022-11-13 MED ORDER — PREDNISONE 20 MG PO TABS
40.0000 mg | ORAL_TABLET | Freq: Every day | ORAL | Status: DC
  Administered 2022-11-15 – 2022-11-16 (×2): 40 mg via ORAL
  Filled 2022-11-13 (×2): qty 2

## 2022-11-13 MED ORDER — POLYETHYLENE GLYCOL 3350 17 G PO PACK: 17.0000 g | PACK | Freq: Every day | ORAL | Status: DC | PRN

## 2022-11-13 MED ORDER — METHYLPREDNISOLONE SODIUM SUCC 125 MG IJ SOLR
125.0000 mg | Freq: Once | INTRAMUSCULAR | Status: AC
  Administered 2022-11-13: 125 mg via INTRAVENOUS
  Filled 2022-11-13: qty 2

## 2022-11-13 MED ORDER — ISOSORBIDE MONONITRATE ER 30 MG PO TB24
30.0000 mg | ORAL_TABLET | Freq: Every day | ORAL | Status: DC
  Administered 2022-11-14 – 2022-11-16 (×3): 30 mg via ORAL
  Filled 2022-11-13 (×3): qty 1

## 2022-11-13 MED ORDER — IPRATROPIUM-ALBUTEROL 0.5-2.5 (3) MG/3ML IN SOLN
3.0000 mL | Freq: Four times a day (QID) | RESPIRATORY_TRACT | Status: DC
  Administered 2022-11-13 – 2022-11-16 (×12): 3 mL via RESPIRATORY_TRACT
  Filled 2022-11-13 (×12): qty 3

## 2022-11-13 MED ORDER — PANTOPRAZOLE SODIUM 40 MG PO TBEC
40.0000 mg | DELAYED_RELEASE_TABLET | Freq: Every day | ORAL | Status: DC
  Administered 2022-11-14 – 2022-11-16 (×3): 40 mg via ORAL
  Filled 2022-11-13 (×3): qty 1

## 2022-11-13 MED ORDER — SUCRALFATE 1 G PO TABS
1.0000 g | ORAL_TABLET | Freq: Two times a day (BID) | ORAL | Status: DC
  Administered 2022-11-14 – 2022-11-16 (×5): 1 g via ORAL
  Filled 2022-11-13 (×5): qty 1

## 2022-11-13 MED ORDER — ATORVASTATIN CALCIUM 20 MG PO TABS
80.0000 mg | ORAL_TABLET | Freq: Every day | ORAL | Status: DC
  Administered 2022-11-14 – 2022-11-16 (×3): 80 mg via ORAL
  Filled 2022-11-13 (×3): qty 4

## 2022-11-13 MED ORDER — ENOXAPARIN SODIUM 40 MG/0.4ML IJ SOSY
40.0000 mg | PREFILLED_SYRINGE | INTRAMUSCULAR | Status: DC
  Administered 2022-11-13 – 2022-11-15 (×3): 40 mg via SUBCUTANEOUS
  Filled 2022-11-13 (×3): qty 0.4

## 2022-11-13 MED ORDER — ASPIRIN 81 MG PO TBEC
81.0000 mg | DELAYED_RELEASE_TABLET | Freq: Every day | ORAL | Status: DC
  Administered 2022-11-14 – 2022-11-16 (×3): 81 mg via ORAL
  Filled 2022-11-13 (×3): qty 1

## 2022-11-13 MED ORDER — ACETAMINOPHEN 325 MG PO TABS
650.0000 mg | ORAL_TABLET | Freq: Four times a day (QID) | ORAL | Status: DC | PRN
  Filled 2022-11-13: qty 2

## 2022-11-13 MED ORDER — ONDANSETRON HCL 4 MG PO TABS: 4.0000 mg | ORAL_TABLET | Freq: Four times a day (QID) | ORAL | Status: DC | PRN

## 2022-11-13 MED ORDER — LOSARTAN POTASSIUM 25 MG PO TABS
25.0000 mg | ORAL_TABLET | Freq: Every day | ORAL | Status: DC
  Administered 2022-11-14 – 2022-11-16 (×3): 25 mg via ORAL
  Filled 2022-11-13 (×3): qty 1

## 2022-11-13 MED ORDER — LORAZEPAM 1 MG PO TABS
1.0000 mg | ORAL_TABLET | Freq: Three times a day (TID) | ORAL | Status: DC | PRN
  Administered 2022-11-14 – 2022-11-16 (×7): 1 mg via ORAL
  Filled 2022-11-13 (×7): qty 1

## 2022-11-13 MED ORDER — ACETAMINOPHEN 650 MG RE SUPP: 650.0000 mg | Freq: Four times a day (QID) | RECTAL | Status: DC | PRN

## 2022-11-13 MED ORDER — SODIUM CHLORIDE 0.9 % IV SOLN
1.0000 g | INTRAVENOUS | Status: DC
  Administered 2022-11-14 – 2022-11-15 (×2): 1 g via INTRAVENOUS
  Filled 2022-11-13 (×2): qty 1
  Filled 2022-11-13: qty 10

## 2022-11-13 MED ORDER — FUROSEMIDE 20 MG PO TABS
20.0000 mg | ORAL_TABLET | Freq: Every day | ORAL | Status: DC
  Administered 2022-11-14 – 2022-11-16 (×3): 20 mg via ORAL
  Filled 2022-11-13 (×3): qty 1

## 2022-11-13 NOTE — ED Notes (Signed)
Pt given graham crackers and diet gingerale per his request.

## 2022-11-13 NOTE — ED Provider Notes (Addendum)
[facility] Provider Note    Event Date/Time   First MD Initiated Contact with Patient 11/13/22 1759     (approximate)   History   Shortness of Breath   HPI  Leslie Sims is a 71 y.o. male with past medical history significant for COPD (baseline 3 L home O2), CAD, presents to the emergency department with shortness of breath and cough.  Endorses 1 week of shortness of breath and cough.  Body aches and myalgias.  States that this occurred 4 days after COVID and influenza vaccinations and thought that he would improve but has not improved.  Recent antibiotic use and steroid use.  No recent hospitalizations.  Denies history of DVT or PE.  Endorses some mild chest discomfort.  Denies nausea vomiting or diarrhea.  No fever.     Physical Exam   Triage Vital Signs: ED Triage Vitals  Enc Vitals Group     BP 11/13/22 1411 116/72     Pulse Rate 11/13/22 1411 88     Resp 11/13/22 1411 18     Temp 11/13/22 1411 98.6 F (37 C)     Temp Source 11/13/22 1411 Oral     SpO2 11/13/22 1411 96 %     Weight --      Height --      Head Circumference --      Peak Flow --      Pain Score 11/13/22 1416 2     Pain Loc --      Pain Edu? --      Excl. in [place]? --     Most recent vital signs: Vitals:   11/13/22 1800 11/13/22 1806  BP: 114/82 (!) 154/91  Pulse: 82 76  Resp:  20  Temp:  97.7 F (36.5 C)  SpO2: 96% 97%    Physical Exam Constitutional:      Appearance: He is well-developed.  HENT:     Head: Atraumatic.  Eyes:     Conjunctiva/sclera: Conjunctivae normal.  Cardiovascular:     Rate and Rhythm: Regular rhythm.  Pulmonary:     Effort: No respiratory distress.     Comments: 82% on 3 L nasal cannula, placed on 4 L nasal cannula with improvement to 96%.  Diffuse inspiratory and expiratory wheezing throughout all lung fields.  No focal rhonchi or rales. Abdominal:     Tenderness: There is no abdominal tenderness.  Musculoskeletal:     Cervical  back: Normal range of motion.     Right lower leg: No tenderness. No edema.     Left lower leg: No tenderness. No edema.  Skin:    General: Skin is warm.  Neurological:     Mental Status: He is alert. Mental status is at baseline.          IMPRESSION / MDM / ASSESSMENT AND PLAN / ED COURSE  I reviewed the triage vital signs and the nursing notes.  Differential diagnosis including COPD exacerbation, pneumonia, COVID/influenza, heart failure exacerbation, ACS.  Have a low suspicion for pulmonary embolism.  On chart review of outside records patient is followed by cardiology for ischemic CAD.  Followed by pulmonology.  Has completed radiation and chemotherapy  EKG  I, Nathaniel Man, the attending physician, personally viewed and interpreted this ECG. No change compared to prior EKG  Rate: Normal  Rhythm: Normal sinus  Axis: Normal  Intervals: Normal  ST&T Change: None.  Nonspecific, T waves inverted to the lateral leads  No  tachycardic or bradycardic dysrhythmias while on cardiac telemetry.  RADIOLOGY I independently reviewed imaging, my interpretation of imaging: Chest x-ray without focal findings consistent with pneumonia  Chest x-ray was read as no acute findings    ED Results / Procedures / Treatments   Labs (all labs ordered are listed, but only abnormal results are displayed) Labs interpreted as -  No significant leukocytosis or anemia.  Troponin is negative.  Normal BNP.  Labs Reviewed  BASIC METABOLIC PANEL - Abnormal; Notable for the following components:      Result Value   Glucose, Bld 118 (*)    All other components within normal limits  RESP PANEL BY RT-PCR (FLU A&B, COVID) ARPGX2  CBC  BRAIN NATRIURETIC PEPTIDE  TROPONIN I (HIGH SENSITIVITY)  TROPONIN I (HIGH SENSITIVITY)   Treatment -DuoNeb treatment, IV Solu-Medrol  On reevaluation patient continues to have respiratory distress with increased work of breathing with tachypnea and retractions.   Clinical picture concerning for COPD exacerbation.  Given Rocephin and azithromycin given his sputum production.  Consulted hospitalist for admission.  PROCEDURES:  Critical Care performed: No  Procedures  Patient's presentation is most consistent with acute presentation with potential threat to life or bodily function.   MEDICATIONS ORDERED IN ED: Medications  ipratropium-albuterol (DUONEB) 0.5-2.5 (3) MG/3ML nebulizer solution 3 mL (3 mLs Nebulization Given 11/13/22 1833)  cefTRIAXone (ROCEPHIN) 1 g in sodium chloride 0.9 % 100 mL IVPB (has no administration in time range)  azithromycin (ZITHROMAX) 500 mg in sodium chloride 0.9 % 250 mL IVPB (has no administration in time range)  methylPREDNISolone sodium succinate (SOLU-MEDROL) 125 mg/2 mL injection 125 mg (125 mg Intravenous Given 11/13/22 1830)    FINAL CLINICAL IMPRESSION(S) / ED DIAGNOSES   Final diagnoses:  COPD exacerbation (HCC)  Shortness of breath     Rx / DC Orders   ED Discharge Orders     None        Note:  This document was prepared using Dragon voice recognition software and may include unintentional dictation errors.   Nathaniel Man, MD 11/13/22 [place]    Nathaniel Man, MD 11/13/22 9935

## 2022-11-13 NOTE — H&P (Signed)
History and Physical    [facility] FOY:[id number] DOB: August 11, 1951 DOA: 11/13/2022  PCP: [name], MD   Patient coming from: Home   Chief Complaint: Epigastric discomfort, lightheaded, SOB   HPI: Leslie Sims is a 71 y.o. male with medical history significant for COPD, chronic hypoxic respiratory failure, CAD, anxiety, and esophageal cancer presents emergency department with epigastric discomfort, lightheadedness, and worsening shortness of breath.  Patient reports that he recently completed a course of antibiotics and steroids with an any appreciable improvement in his shortness of breath or cough.  He has also been experiencing epigastric discomfort since his EGD on 11/08/2022, but this seems to be improving some.  He denies any fever or chest pain.  He has had general malaise with fatigue, aches, and lightheadedness on standing since he received his COVID and influenza vaccines roughly a week ago.  He became acutely lightheaded upon standing earlier today but did not lose consciousness.  ED Course: Upon arrival to the ED, patient is found to be afebrile and saturating mid 90s on his usual 3 L/min of supplemental oxygen.  EKG demonstrates sinus rhythm with PACs.  Chest x-ray negative for acute cardiopulmonary disease.  BMP and CBC are unremarkable, troponin is normal x 2, BNP is normal, and COVID and influenza PCR are negative.  Patient was treated in the emergency department with IV steroids, antibiotics, and DuoNeb.  Review of Systems:  All other systems reviewed and apart from HPI, are negative.  Past Medical History:  Diagnosis Date   Allergic rhinitis    Anxiety    Back pain    Cancer (HCC)    prostate   CHF (congestive heart failure) ([place])    Colon polyps 08/2012   colonoscopy; multiple colon polyps; repeat colonoscopy in one year.   Complication of anesthesia    COPD (chronic obstructive pulmonary disease) ([place])    a. on home O2.   Coronary artery  disease 11/2009   a. late presenting ant MI; b. LHC 100% pLAD s/p PCI/DES, 99% mRCA s/p PCI/DES, EF 35%; c. nuclear stress test 05/13: prior ant/inf infarcts w/o ischemia, EF 43%; d. LHC 02/14: widely patent stents with no other obs dz, EF 40%; e. LHC 11/17: LM nl, mLAD 10%, patent LAD stent, dLAD 20%, p-mRCA 10%, mRCA 40%, patent RCA stent, EF 35-45%    Depression    Dysphagia    Family history of adverse reaction to anesthesia    GERD (gastroesophageal reflux disease)    VEHMCNOB(096.2)    Helicobacter pylori (H. pylori)    Hemorrhoids    Hernia    inguinal   HFrEF (heart failure with reduced ejection fraction) ([place])    a. 10/2016 LV gram: EF 35-45%; b. 06/2018 Echo: EF 40-45%. c. 01/2021 Echo: EF 40-45%   Hyperlipidemia    Hypertension    Insomnia    Ischemic cardiomyopathy    a. 10/2016 LV gram: EF 35-45%; b. 06/2018 Echo: EF 40-45%.   Myalgia    Oxygen dependent    Peptic ulcer    Pulmonary nodules    ST elevation (STEMI) myocardial infarction involving left anterior descending coronary artery ([place]) 11/2009   a. s/p PCI to the LAD   Status post dilation of esophageal narrowing 2000   Thickening of esophagus    Tinea pedis    Wears dentures    partial upper    Past Surgical History:  Procedure Laterality Date    coronary stents     Admission  12/20/2012   COPD exacerbation.  [place].   CARDIAC CATHETERIZATION     CARDIAC CATHETERIZATION  02/05/2013   [facility]   CARDIAC CATHETERIZATION  09/19/2013   [facility] : patent stents with no change in anatomy. EF: 40$   CARDIAC CATHETERIZATION Left 11/12/2016   Procedure: Left Heart Cath and Coronary Angiography;  Surgeon: Wellington Hampshire, MD;  Location: [place] [facility];  Service: Cardiovascular;  Laterality: Left;   COLONOSCOPY     COLONOSCOPY WITH PROPOFOL N/A 05/18/2016   Procedure: COLONOSCOPY WITH PROPOFOL;  Surgeon: Lucilla Lame, MD;  Location: [facility] ENDOSCOPY;  Service: Endoscopy;  Laterality: N/A;   COLONOSCOPY WITH PROPOFOL N/A  03/19/2020   Procedure: COLONOSCOPY WITH PROPOFOL;  Surgeon: Lin Landsman, MD;  Location: [facility] ENDOSCOPY;  Service: Gastroenterology;  Laterality: N/A;   CORONARY ANGIOPLASTY WITH STENT PLACEMENT  09/19/2009   LAD 3.0 X23 mm Xience DES, RCA: 4.0 X 15 mm Xience DES   ELECTROMAGNETIC NAVIGATION BROCHOSCOPY N/A 10/27/2017   Procedure: ELECTROMAGNETIC NAVIGATION BRONCHOSCOPY;  Surgeon: Flora Lipps, MD;  Location: [facility] ORS;  Service: Cardiopulmonary;  Laterality: N/A;   ESOPHAGEAL DILATION     ESOPHAGOGASTRODUODENOSCOPY  12/20/2006   ESOPHAGOGASTRODUODENOSCOPY  08/20/2012   ESOPHAGOGASTRODUODENOSCOPY (EGD) WITH PROPOFOL N/A 04/19/2022   Procedure: ESOPHAGOGASTRODUODENOSCOPY (EGD) WITH PROPOFOL;  Surgeon: [name], MD;  Location: [facility] ENDOSCOPY;  Service: Endoscopy;  Laterality: N/A;   ESOPHAGOGASTRODUODENOSCOPY (EGD) WITH PROPOFOL N/A 04/27/2022   Procedure: ESOPHAGOGASTRODUODENOSCOPY (EGD) WITH PROPOFOL;  Surgeon: [name], MD;  Location: [facility] ENDOSCOPY;  Service: Endoscopy;  Laterality: N/A;   ESOPHAGOGASTRODUODENOSCOPY (EGD) WITH PROPOFOL N/A 11/08/2022   Procedure: ESOPHAGOGASTRODUODENOSCOPY (EGD) WITH PROPOFOL;  Surgeon: [name], MD;  Location: [facility] ENDOSCOPY;  Service: Endoscopy;  Laterality: N/A;   EUS N/A 05/20/2022   Procedure: UPPER ENDOSCOPIC ULTRASOUND (EUS) LINEAR;  Surgeon: Reita Cliche, MD;  Location: [facility] ENDOSCOPY;  Service: Gastroenterology;  Laterality: N/A;  [facility]   HERNIA REPAIR  07/20/2012   L inguinal hernia repair   PENILE PROSTHESIS IMPLANT      Social History:   reports that he quit smoking about 3 years ago. His smoking use included cigarettes. He has a 126.00 pack-year smoking history. He has never used smokeless tobacco. He reports that he does not drink alcohol and does not use drugs.  Allergies  Allergen Reactions   Prozac [Fluoxetine Hcl] Shortness Of Breath   Hydroxyzine    Bupropion Other (See Comments)     "Makes me feel funny" Other reaction(s): Other (See Comments) Unknown/"made me feel real funny" "Makes me feel funny" "Makes me feel funny" Other reaction(s): Other (See Comments) Unknown/"made me feel real funny" "Makes me feel funny"   Effexor Xr [Venlafaxine Hcl Er] Other (See Comments)    "Makes me feel funny"    Family History  Problem Relation Age of Onset   Heart attack Brother        Brother #1   Diabetes Brother    Hypertension Brother        #3   Coronary artery disease Father 14       deceased   Heart attack Father    Diabetes Father    Heart disease Father    COPD Mother 72       deceased   Alcohol abuse Sister        polysubstance abuse   COPD Sister    Lung cancer Sister    Alcohol abuse Sister        polysubstance abuse  Penile cancer Brother    Diabetes Brother    Prostate cancer Neg Hx    Bladder Cancer Neg Hx    Kidney cancer Neg Hx      Prior to Admission medications   Medication Sig Start Date End Date Taking? Authorizing Provider  albuterol (VENTOLIN HFA) 108 (90 Base) MCG/ACT inhaler TAKE 2 PUFFS BY MOUTH EVERY 6 HOURS AS NEEDED FOR WHEEZE OR SHORTNESS OF BREATH 10/04/22   [name], MD  aspirin 81 MG tablet Take 81 mg by mouth daily.    [provider]  atorvastatin (LIPITOR) 80 MG tablet Take 1 tablet (80 mg total) by mouth daily. 03/18/21   Dunn, Areta Haber, PA-C  budesonide (PULMICORT) 0.5 MG/2ML nebulizer solution Take 2 mLs (0.5 mg total) by nebulization in the morning and at bedtime. 05/24/22   [name], MD  docusate sodium (COLACE) 100 MG capsule Take 1 capsule (100 mg total) by mouth 2 (two) times daily. 06/18/22   [name], MD  esomeprazole (NEXIUM) 40 MG capsule TAKE ONE CAPSULE BY MOUTH ONCE DAILY 09/04/21   [name], MD  fluticasone [facility]) 50 MCG/ACT nasal spray Place 2 sprays into both nostrils daily. 06/08/21   [provider]  furosemide (LASIX) 20 MG tablet Take 1 tablet (20 mg total) by  mouth daily. 09/28/22 12/27/22  Hammock, Barbera Setters, NP  ipratropium-albuterol (DUONEB) 0.5-2.5 (3) MG/3ML SOLN Take 3 mLs by nebulization 2 (two) times daily.    [provider]  isosorbide mononitrate (IMDUR) 30 MG 24 hr tablet Take 1 tablet (30 mg total) by mouth daily. 09/28/22 12/27/22  Gerrie Nordmann, NP  ketoconazole (NIZORAL) 2 % cream Apply topically 2 (two) times daily. 09/10/22   [provider]  loratadine (CLARITIN) 10 MG tablet Take 10 mg by mouth daily. 06/08/21   [provider]  LORazepam (ATIVAN) 1 MG tablet Take 1 tablet (1 mg total) by mouth every 4 (four) hours while awake. Patient taking differently: Take 1 mg by mouth 3 (three) times daily. 02/14/19   [name], MD  losartan (COZAAR) 25 MG tablet Take 25 mg by mouth daily. 02/23/19   [provider]  mirtazapine (REMERON) 45 MG tablet Take 45 mg by mouth at bedtime. 06/29/22   [provider]  Multiple Vitamin (MULTIVITAMIN) capsule Take 1 capsule by mouth daily.    [provider]  nitroGLYCERIN (NITROSTAT) 0.4 MG SL tablet Place 1 tablet (0.4 mg total) under the tongue every 5 (five) minutes as needed. 07/28/22   Furth, Cadence H, PA-C  OXYGEN Inhale 2.5 L into the lungs daily.    [provider]  polyethylene glycol (MIRALAX) 17 g packet Take 17 g by mouth daily as needed.    [provider]  Spacer/Aero-Holding Chambers (AEROCHAMBER MV) inhaler Use as instructed 05/04/21   Margarette Canada, NP  sucralfate (CARAFATE) 1 g tablet Take 1 g by mouth 2 (two) times daily with a meal. 06/03/20   [provider]  prochlorperazine (COMPAZINE) 10 MG tablet Take 1 tablet (10 mg total) by mouth every 6 (six) hours as needed (Nausea or vomiting). 05/31/22 08/29/22  [name], MD    Physical Exam: Vitals:   11/13/22 1411 11/13/22 1800 11/13/22 1806  BP: 116/72 114/82 (!) 154/91  Pulse: 88 82 76  Resp: 18  20  Temp: 98.6 F (37 C)  97.7 F (36.5 C)  TempSrc: Oral  Oral   SpO2: 96% 96% 97%    Constitutional: NAD, calm  Eyes: PERTLA, lids and conjunctivae normal ENMT: Mucous membranes are moist. Posterior pharynx clear of any exudate or lesions.   Neck: supple, no masses  Respiratory: Diminished bilaterally with prolonged expiratory phase, wheezes. Dyspnea with speech.  Cardiovascular: S1 & S2 heard, regular rate and rhythm. No extremity edema.  Abdomen: No distension, no tenderness, soft. Bowel sounds active.  Musculoskeletal: no clubbing / cyanosis. No joint deformity upper and lower extremities.   Skin: no significant rashes, lesions, ulcers. Warm, dry, well-perfused. Neurologic: CN 2-12 grossly intact. Moving all extremities. Alert and oriented.  Psychiatric: Calm. Cooperative.    Labs and Imaging on Admission: I have personally reviewed following labs and imaging studies  CBC: Recent Labs  Lab 11/13/22 1412  WBC 5.7  HGB 13.2  HCT 41.1  MCV 94.5  PLT 124   Basic Metabolic Panel: Recent Labs  Lab 11/13/22 1412  NA 142  K 3.9  CL 108  CO2 28  GLUCOSE 118*  BUN 17  CREATININE 0.72  CALCIUM 8.9   GFR: Estimated Creatinine Clearance: 97.4 mL/min (by C-G formula based on SCr of 0.72 mg/dL). Liver Function Tests: No results for input(s): "AST", "ALT", "ALKPHOS", "BILITOT", "PROT", "ALBUMIN" in the last 168 hours. No results for input(s): "LIPASE", "AMYLASE" in the last 168 hours. No results for input(s): "AMMONIA" in the last 168 hours. Coagulation Profile: No results for input(s): "INR", "PROTIME" in the last 168 hours. Cardiac Enzymes: No results for input(s): "CKTOTAL", "CKMB", "CKMBINDEX", "TROPONINI" in the last 168 hours. BNP (last 3 results) No results for input(s): "PROBNP" in the last 8760 hours. HbA1C: No results for input(s): "HGBA1C" in the last 72 hours. CBG: No results for input(s): "GLUCAP" in the last 168 hours. Lipid Profile: No results for input(s): "CHOL", "HDL", "LDLCALC", "TRIG", "CHOLHDL", "LDLDIRECT" in  the last 72 hours. Thyroid Function Tests: No results for input(s): "TSH", "T4TOTAL", "FREET4", "T3FREE", "THYROIDAB" in the last 72 hours. Anemia Panel: No results for input(s): "VITAMINB12", "FOLATE", "FERRITIN", "TIBC", "IRON", "RETICCTPCT" in the last 72 hours. Urine analysis:    Component Value Date/Time   COLORURINE YELLOW (A) 12/22/2021 0238   APPEARANCEUR CLEAR (A) 12/22/2021 0238   APPEARANCEUR Clear 01/10/2013 1205   LABSPEC 1.021 12/22/2021 0238   LABSPEC 1.004 01/10/2013 1205   PHURINE 7.0 12/22/2021 0238   GLUCOSEU NEGATIVE 12/22/2021 0238   GLUCOSEU 50 mg/dL 01/10/2013 1205   HGBUR NEGATIVE 12/22/2021 0238   BILIRUBINUR NEGATIVE 12/22/2021 0238   BILIRUBINUR negative 07/27/2017 1259   BILIRUBINUR neg 05/10/2014 1208   BILIRUBINUR Negative 01/10/2013 1205   KETONESUR NEGATIVE 12/22/2021 0238   PROTEINUR 30 (A) 12/22/2021 0238   UROBILINOGEN 0.2 07/27/2017 1259   NITRITE NEGATIVE 12/22/2021 0238   LEUKOCYTESUR NEGATIVE 12/22/2021 0238   LEUKOCYTESUR Negative 01/10/2013 1205   Sepsis Labs: '@LABRCNTIP'$ (procalcitonin:4,lacticidven:4) ) Recent Results (from the past 240 hour(s))  Resp Panel by RT-PCR (Flu A&B, Covid) Anterior Nasal Swab     Status: None   Collection Time: 11/13/22  6:28 PM   Specimen: Anterior Nasal Swab  Result Value Ref Range Status   SARS Coronavirus 2 by RT PCR NEGATIVE NEGATIVE Final    Comment: (NOTE) SARS-CoV-2 target nucleic acids are NOT DETECTED.  The SARS-CoV-2 RNA is generally detectable in upper respiratory specimens during the acute phase of infection. The lowest concentration of SARS-CoV-2 viral copies this assay can detect is 138 copies/mL. A negative result does not preclude SARS-Cov-2 infection and should not be used as the sole basis for treatment or other patient management decisions.  A negative result may occur with  improper specimen collection/handling, submission of specimen other than nasopharyngeal swab, presence of  viral mutation(s) within the areas targeted by this assay, and inadequate number of viral copies(<138 copies/mL). A negative result must be combined with clinical observations, patient history, and epidemiological information. The expected result is Negative.  Fact Sheet for Patients:  EntrepreneurPulse.com.au  Fact Sheet for Healthcare Providers:  IncredibleEmployment.be  This test is no t yet approved or cleared by the [place] FDA and  has been authorized for detection and/or diagnosis of SARS-CoV-2 by FDA under an Emergency Use Authorization (EUA). This EUA will remain  in effect (meaning this test can be used) for the duration of the COVID-19 declaration under Section 564(b)(1) of the Act, 21 U.S.C.section 360bbb-3(b)(1), unless the authorization is terminated  or revoked sooner.       Influenza A by PCR NEGATIVE NEGATIVE Final   Influenza B by PCR NEGATIVE NEGATIVE Final    Comment: (NOTE) The Xpert Xpress SARS-CoV-2/FLU/RSV plus assay is intended as an aid in the diagnosis of influenza from Nasopharyngeal swab specimens and should not be used as a sole basis for treatment. Nasal washings and aspirates are unacceptable for Xpert Xpress SARS-CoV-2/FLU/RSV testing.  Fact Sheet for Patients: EntrepreneurPulse.com.au  Fact Sheet for Healthcare Providers: IncredibleEmployment.be  This test is not yet approved or cleared by the [place] FDA and has been authorized for detection and/or diagnosis of SARS-CoV-2 by FDA under an Emergency Use Authorization (EUA). This EUA will remain in effect (meaning this test can be used) for the duration of the COVID-19 declaration under Section 564(b)(1) of the Act, 21 U.S.C. section 360bbb-3(b)(1), unless the authorization is terminated or revoked.  Performed at [facility], [address] [place], [place] [address]      Radiological Exams on  Admission: DG Chest 2 View  Result Date: 11/13/2022 CLINICAL DATA:  Shortness of breath EXAM: CHEST - 2 VIEW COMPARISON:  10/27/2022 radiograph, 09/02/2022 PET CT and prior studies FINDINGS: The cardiomediastinal silhouette is unremarkable. There is no evidence of focal airspace disease, pulmonary edema, suspicious pulmonary nodule/mass, pleural effusion, or pneumothorax. No interval change from the prior study. No acute bony abnormalities are identified. IMPRESSION: No acute cardiopulmonary disease. Electronically Signed   By: Margarette Canada M.D.   On: 11/13/2022 14:41    EKG: Independently reviewed. Sinus rhythm, PACs.   Assessment/Plan   1. COPD exacerbation; chronic hypoxic respiratory failure  - Did not improve with recent outpatient coarse of steroids and antibiotics  - Treated in ED with DuoNeb, IV Solu-Medrol, and Rocephin  - Culture sputum, continue systemic steroid and antibiotic, schedule DuoNeb, use additional SABA prn, continue supplemental O2    2. 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 CAD  - No anginal complaints   - Troponin normal x2 in ED and no acute ischemic features on EKG  - Continue ASA, Lipitor, Imdur    4. Chronic diastolic CHF  - Appears compensated   - EF was preserved on TTE from September 2023  - Continue Lasix, monitor volume status    5. 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 Depression, anxiety  - Prescription database reviewed, continue Ativan TID as at home, continue Remeron   7. Hypertension  - Continue losartan    DVT prophylaxis: Lovenox  Code Status: Full  Level of  Care: Level of care: Telemetry Medical Family Communication: none present   Disposition Plan:  Patient is from: home  Anticipated d/c is to: home  Anticipated d/c date is: Possibly as early as 11/26 or 11/15/22  Patient currently: Pending improvement in respiratory status  Consults called: none  Admission status: Observation     Vianne Bulls, MD [facility]  11/13/2022, 8:02 PM

## 2022-11-13 NOTE — ED Triage Notes (Signed)
Pt to ED via [facility] from home for shortness of breath. Pt states that he has COPD and he went to get up to do a treatment today and he got dizziness and fell. Pt states that he took the new COVID shot and a flu shot on Monday and has felt bad since then. Pt states that he is having some discomfort in his chest area. Pt is chronically on 2.5-3 liters of oxygen. Pt is currently on 3 liters and SpO2 is 96%. Pt is in NAD

## 2022-11-14 ENCOUNTER — Encounter: Payer: Self-pay | Admitting: Family Medicine

## 2022-11-14 DIAGNOSIS — J441 Chronic obstructive pulmonary disease with (acute) exacerbation: Secondary | ICD-10-CM | POA: Diagnosis present

## 2022-11-14 DIAGNOSIS — I252 Old myocardial infarction: Secondary | ICD-10-CM | POA: Diagnosis not present

## 2022-11-14 DIAGNOSIS — I5042 Chronic combined systolic (congestive) and diastolic (congestive) heart failure: Secondary | ICD-10-CM | POA: Diagnosis present

## 2022-11-14 DIAGNOSIS — J9611 Chronic respiratory failure with hypoxia: Secondary | ICD-10-CM | POA: Diagnosis present

## 2022-11-14 DIAGNOSIS — J309 Allergic rhinitis, unspecified: Secondary | ICD-10-CM | POA: Diagnosis present

## 2022-11-14 DIAGNOSIS — Z955 Presence of coronary angioplasty implant and graft: Secondary | ICD-10-CM | POA: Diagnosis not present

## 2022-11-14 DIAGNOSIS — Z87891 Personal history of nicotine dependence: Secondary | ICD-10-CM | POA: Diagnosis not present

## 2022-11-14 DIAGNOSIS — Y842 Radiological procedure and radiotherapy as the cause of abnormal reaction of the patient, or of later complication, without mention of misadventure at the time of the procedure: Secondary | ICD-10-CM | POA: Diagnosis present

## 2022-11-14 DIAGNOSIS — Z8249 Family history of ischemic heart disease and other diseases of the circulatory system: Secondary | ICD-10-CM | POA: Diagnosis not present

## 2022-11-14 DIAGNOSIS — K208 Other esophagitis without bleeding: Secondary | ICD-10-CM | POA: Diagnosis present

## 2022-11-14 DIAGNOSIS — Z9981 Dependence on supplemental oxygen: Secondary | ICD-10-CM | POA: Diagnosis not present

## 2022-11-14 DIAGNOSIS — C159 Malignant neoplasm of esophagus, unspecified: Secondary | ICD-10-CM | POA: Diagnosis present

## 2022-11-14 DIAGNOSIS — I110 Hypertensive heart disease with heart failure: Secondary | ICD-10-CM | POA: Diagnosis present

## 2022-11-14 DIAGNOSIS — Z9221 Personal history of antineoplastic chemotherapy: Secondary | ICD-10-CM | POA: Diagnosis not present

## 2022-11-14 DIAGNOSIS — Z8719 Personal history of other diseases of the digestive system: Secondary | ICD-10-CM | POA: Diagnosis not present

## 2022-11-14 DIAGNOSIS — E785 Hyperlipidemia, unspecified: Secondary | ICD-10-CM | POA: Diagnosis present

## 2022-11-14 DIAGNOSIS — K219 Gastro-esophageal reflux disease without esophagitis: Secondary | ICD-10-CM | POA: Diagnosis present

## 2022-11-14 DIAGNOSIS — Z923 Personal history of irradiation: Secondary | ICD-10-CM | POA: Diagnosis not present

## 2022-11-14 DIAGNOSIS — F419 Anxiety disorder, unspecified: Secondary | ICD-10-CM | POA: Diagnosis present

## 2022-11-14 DIAGNOSIS — F32A Depression, unspecified: Secondary | ICD-10-CM | POA: Diagnosis present

## 2022-11-14 DIAGNOSIS — R0602 Shortness of breath: Secondary | ICD-10-CM | POA: Diagnosis present

## 2022-11-14 DIAGNOSIS — Z8711 Personal history of peptic ulcer disease: Secondary | ICD-10-CM | POA: Diagnosis not present

## 2022-11-14 DIAGNOSIS — I255 Ischemic cardiomyopathy: Secondary | ICD-10-CM | POA: Diagnosis present

## 2022-11-14 DIAGNOSIS — I2510 Atherosclerotic heart disease of native coronary artery without angina pectoris: Secondary | ICD-10-CM | POA: Diagnosis present

## 2022-11-14 DIAGNOSIS — G4700 Insomnia, unspecified: Secondary | ICD-10-CM | POA: Diagnosis present

## 2022-11-14 DIAGNOSIS — Z20822 Contact with and (suspected) exposure to covid-19: Secondary | ICD-10-CM | POA: Diagnosis present

## 2022-11-14 LAB — CBC
HCT: 43.8 % (ref 39.0–52.0)
Hemoglobin: 14.2 g/dL (ref 13.0–17.0)
MCH: 30.3 pg (ref 26.0–34.0)
MCHC: 32.4 g/dL (ref 30.0–36.0)
MCV: 93.6 fL (ref 80.0–100.0)
Platelets: 198 10*3/uL (ref 150–400)
RBC: 4.68 MIL/uL (ref 4.22–5.81)
RDW: 12.5 % (ref 11.5–15.5)
WBC: 6.8 10*3/uL (ref 4.0–10.5)
nRBC: 0 % (ref 0.0–0.2)

## 2022-11-14 LAB — BASIC METABOLIC PANEL
Anion gap: 8 (ref 5–15)
BUN: 13 mg/dL (ref 8–23)
CO2: 31 mmol/L (ref 22–32)
Calcium: 9.6 mg/dL (ref 8.9–10.3)
Chloride: 104 mmol/L (ref 98–111)
Creatinine, Ser: 0.91 mg/dL (ref 0.61–1.24)
GFR, Estimated: >60 mL/min (ref >60)
Glucose, Bld: 163 mg/dL — ABNORMAL HIGH (ref 70–99)
Potassium: 3.7 mmol/L (ref 3.5–5.1)
Sodium: 143 mmol/L (ref 135–145)

## 2022-11-14 LAB — HIV ANTIBODY (ROUTINE TESTING W REFLEX): HIV Screen 4th Generation wRfx: NONREACTIVE

## 2022-11-14 LAB — MAGNESIUM: Magnesium: 2.1 mg/dL (ref 1.7–2.4)

## 2022-11-14 NOTE — Plan of Care (Signed)

## 2022-11-14 NOTE — Progress Notes (Addendum)
Progress Note    Jur[facility]  SWH:[id number] DOB: [birth year]  DOA: 11/13/2022 PCP: [name], MD      Brief Narrative:    Medical records reviewed and are as summarized below:  Leslie Sims is a 71 y.o. male  with medical history significant for COPD, chronic hypoxic respiratory failure, CAD, anxiety, and esophageal cancer, who presented to the emergency department with epigastric discomfort, lightheadedness, cough, wheezing and worsening shortness of breath.   Patient reports that he recently completed a course of antibiotics and steroids with an any appreciable improvement in his shortness of breath or cough.  He has also been experiencing epigastric discomfort since his EGD on 11/08/2022, but this seems to be improving some   He was admitted to the hospital for COPD exacerbation.      Assessment/Plan:   Principal Problem:   COPD with acute exacerbation (HCC) Active Problems:   DEPRESSION/ANXIETY   Essential hypertension   CAD, NATIVE VESSEL   Chronic heart failure with preserved ejection fraction (HFpEF) (HCC)   Chronic respiratory failure with hypoxia (HCC)   Primary esophageal squamous cell carcinoma (HCC)    COPD exacerbation: Continue steroids, bronchodilators and IV ceftriaxone  Chronic hypoxic respiratory failure: Continue 3 L/min oxygen via nasal cannula.  He uses 2.5 L/min oxygen at rest and up to 3 L/min oxygen with activity.  Epigastric pain, known severe radiation esophagitis EGD on 11/08/2022: Probably related to history of radiation.  Liver enzymes and lipase levels were normal.  Continue Protonix  Chronic diastolic CHF: Compensated.  Continue Lasix  Other comorbidities include depression, anxiety, hypertension, history of esophageal cancer followed by oncologist   Diet Order             Diet regular Room service appropriate? Yes; Fluid consistency: Thin  Diet effective now                             Consultants: None  Procedures: None    Medications:    aspirin EC  81 mg Oral Daily   atorvastatin  80 mg Oral Daily   enoxaparin (LOVENOX) injection  40 mg Subcutaneous Q24H   furosemide  20 mg Oral Daily   ipratropium-albuterol  3 mL Nebulization Q6H   isosorbide mononitrate  30 mg Oral Daily   losartan  25 mg Oral Daily   methylPREDNISolone (SOLU-MEDROL) injection  125 mg Intravenous Q12H   Followed by   Derrill Memo ON 11/15/2022] predniSONE  40 mg Oral Q breakfast   mirtazapine  45 mg Oral QHS   pantoprazole  40 mg Oral Daily   sodium chloride flush  3 mL Intravenous Q12H   sucralfate  1 g Oral BID WC   Continuous Infusions:  cefTRIAXone (ROCEPHIN)  IV       Anti-infectives (From admission, onward)    Start     Dose/Rate Route Frequency Ordered Stop   11/14/22 2000  cefTRIAXone (ROCEPHIN) 1 g in sodium chloride 0.9 % 100 mL IVPB        1 g 200 mL/hr over 30 Minutes Intravenous Every 24 hours 11/13/22 2001 11/18/22 1959   11/13/22 1945  cefTRIAXone (ROCEPHIN) 1 g in sodium chloride 0.9 % 100 mL IVPB        1 g 200 mL/hr over 30 Minutes Intravenous  Once 11/13/22 1940 11/13/22 2036   11/13/22 1945  azithromycin (ZITHROMAX) 500 mg in sodium chloride 0.9 % 250 mL IVPB  Status:  Discontinued        500 mg 250 mL/hr over 60 Minutes Intravenous  Once 11/13/22 1940 11/13/22 2001              Family Communication/Anticipated D/C date and plan/Code Status   DVT prophylaxis: enoxaparin (LOVENOX) injection 40 mg Start: 11/13/22 2200     Code Status: Full Code  Family Communication: None Disposition Plan: Plan to discharge home tomorrow   Status is: Observation The patient will require care spanning > 2 midnights and should be moved to inpatient because: Shortness of breath       Subjective:   Interval events noted.  He complains of cough, shortness of breath and wheezing.  No chest pain  Objective:    Vitals:   11/14/22 0425  11/14/22 0507 11/14/22 0812 11/14/22 0828  BP:  107/77  (!) 158/86  Pulse:  77  94  Resp:  18  17  Temp: 97.7 F (36.5 C)   97.7 F (36.5 C)  TempSrc: Oral     SpO2:  95% 98% 100%   No data found.   Intake/Output Summary (Last 24 hours) at 11/14/2022 1140 Last data filed at 11/14/2022 1100 Gross per 24 hour  Intake --  Output 700 ml  Net -700 ml   There were no vitals filed for this visit.  Exam:  GEN: NAD SKIN: Warm and dry EYES: No pallor or icterus ENT: MMM CV: RRR PULM: Decreased air entry bilaterally, bilateral expiratory wheezing ABD: soft, ND, NT, +BS CNS: AAO x 3, non focal EXT: No edema or tenderness        Data Reviewed:   I have personally reviewed following labs and imaging studies:  Labs: Labs show the following:   Basic Metabolic Panel: Recent Labs  Lab 11/13/22 1412 11/14/22 0426  NA 142 143  K 3.9 3.7  CL 108 104  CO2 28 31  GLUCOSE 118* 163*  BUN 17 13  CREATININE 0.72 0.91  CALCIUM 8.9 9.6  MG  --  2.1   GFR Estimated Creatinine Clearance: 85.6 mL/min (by C-G formula based on SCr of 0.91 mg/dL). Liver Function Tests: Recent Labs  Lab 11/13/22 1818  AST 29  ALT 36  ALKPHOS 58  BILITOT 0.6  PROT 6.3*  ALBUMIN 3.7   Recent Labs  Lab 11/13/22 1818  LIPASE 29   No results for input(s): "AMMONIA" in the last 168 hours. Coagulation profile No results for input(s): "INR", "PROTIME" in the last 168 hours.  CBC: Recent Labs  Lab 11/13/22 1412 11/14/22 0426  WBC 5.7 6.8  HGB 13.2 14.2  HCT 41.1 43.8  MCV 94.5 93.6  PLT 187 198   Cardiac Enzymes: No results for input(s): "CKTOTAL", "CKMB", "CKMBINDEX", "TROPONINI" in the last 168 hours. BNP (last 3 results) No results for input(s): "PROBNP" in the last 8760 hours. CBG: No results for input(s): "GLUCAP" in the last 168 hours. D-Dimer: No results for input(s): "DDIMER" in the last 72 hours. Hgb A1c: No results for input(s): "HGBA1C" in the last 72 hours. Lipid  Profile: No results for input(s): "CHOL", "HDL", "LDLCALC", "TRIG", "CHOLHDL", "LDLDIRECT" in the last 72 hours. Thyroid function studies: No results for input(s): "TSH", "T4TOTAL", "T3FREE", "THYROIDAB" in the last 72 hours.  Invalid input(s): "FREET3" Anemia work up: No results for input(s): "VITAMINB12", "FOLATE", "FERRITIN", "TIBC", "IRON", "RETICCTPCT" in the last 72 hours. Sepsis Labs: Recent Labs  Lab 11/13/22 1412 11/14/22 0426  WBC 5.7 6.8    Microbiology Recent  Results (from the past 240 hour(s))  Resp Panel by RT-PCR (Flu A&B, Covid) Anterior Nasal Swab     Status: None   Collection Time: 11/13/22  6:28 PM   Specimen: Anterior Nasal Swab  Result Value Ref Range Status   SARS Coronavirus 2 by RT PCR NEGATIVE NEGATIVE Final    Comment: (NOTE) SARS-CoV-2 target nucleic acids are NOT DETECTED.  The SARS-CoV-2 RNA is generally detectable in upper respiratory specimens during the acute phase of infection. The lowest concentration of SARS-CoV-2 viral copies this assay can detect is 138 copies/mL. A negative result does not preclude SARS-Cov-2 infection and should not be used as the sole basis for treatment or other patient management decisions. A negative result may occur with  improper specimen collection/handling, submission of specimen other than nasopharyngeal swab, presence of viral mutation(s) within the areas targeted by this assay, and inadequate number of viral copies(<138 copies/mL). A negative result must be combined with clinical observations, patient history, and epidemiological information. The expected result is Negative.  Fact Sheet for Patients:  EntrepreneurPulse.com.au  Fact Sheet for Healthcare Providers:  IncredibleEmployment.be  This test is no t yet approved or cleared by the [place] FDA and  has been authorized for detection and/or diagnosis of SARS-CoV-2 by FDA under an Emergency Use Authorization  (EUA). This EUA will remain  in effect (meaning this test can be used) for the duration of the COVID-19 declaration under Section 564(b)(1) of the Act, 21 U.S.C.section 360bbb-3(b)(1), unless the authorization is terminated  or revoked sooner.       Influenza A by PCR NEGATIVE NEGATIVE Final   Influenza B by PCR NEGATIVE NEGATIVE Final    Comment: (NOTE) The Xpert Xpress SARS-CoV-2/FLU/RSV plus assay is intended as an aid in the diagnosis of influenza from Nasopharyngeal swab specimens and should not be used as a sole basis for treatment. Nasal washings and aspirates are unacceptable for Xpert Xpress SARS-CoV-2/FLU/RSV testing.  Fact Sheet for Patients: EntrepreneurPulse.com.au  Fact Sheet for Healthcare Providers: IncredibleEmployment.be  This test is not yet approved or cleared by the [place] FDA and has been authorized for detection and/or diagnosis of SARS-CoV-2 by FDA under an Emergency Use Authorization (EUA). This EUA will remain in effect (meaning this test can be used) for the duration of the COVID-19 declaration under Section 564(b)(1) of the Act, 21 U.S.C. section 360bbb-3(b)(1), unless the authorization is terminated or revoked.  Performed at [facility], [address] [place], [place] [address]     Procedures and diagnostic studies:  DG Chest 2 View  Result Date: 11/13/2022 CLINICAL DATA:  Shortness of breath EXAM: CHEST - 2 VIEW COMPARISON:  10/27/2022 radiograph, 09/02/2022 PET CT and prior studies FINDINGS: The cardiomediastinal silhouette is unremarkable. There is no evidence of focal airspace disease, pulmonary edema, suspicious pulmonary nodule/mass, pleural effusion, or pneumothorax. No interval change from the prior study. No acute bony abnormalities are identified. IMPRESSION: No acute cardiopulmonary disease. Electronically Signed   By: Margarette Canada M.D.   On: 11/13/2022 14:41                LOS: 0 days   Navdeep Fessenden  [facility]   Pager on [email or web address]. If 7PM-7AM, please contact night-coverage at [email or web address]     11/14/2022, 11:40 AM

## 2022-11-15 DIAGNOSIS — J441 Chronic obstructive pulmonary disease with (acute) exacerbation: Secondary | ICD-10-CM | POA: Diagnosis not present

## 2022-11-15 MED ORDER — ORAL CARE MOUTH RINSE: 15.0000 mL | OROMUCOSAL | Status: DC | PRN

## 2022-11-15 NOTE — Progress Notes (Signed)
Progress Note    Leslie Sims [facility]  VEL:[id number] DOB: 02/02/1951  DOA: 11/13/2022 PCP: [name], MD      Brief Narrative:    Medical records reviewed and are as summarized below:  Leslie Sims is a 71 y.o. male  with medical history significant for COPD, chronic hypoxic respiratory failure, CAD, anxiety, and esophageal cancer, who presented to the emergency department with epigastric discomfort, lightheadedness, cough, wheezing and worsening shortness of breath.   Patient reports that he recently completed a course of antibiotics and steroids with an any appreciable improvement in his shortness of breath or cough.  He has also been experiencing epigastric discomfort since his EGD on 11/08/2022, but this seems to be improving some   He was admitted to the hospital for COPD exacerbation.      Assessment/Plan:   Principal Problem:   COPD with acute exacerbation (HCC) Active Problems:   DEPRESSION/ANXIETY   Essential hypertension   CAD, NATIVE VESSEL   Chronic heart failure with preserved ejection fraction (HFpEF) (HCC)   Chronic respiratory failure with hypoxia (HCC)   Primary esophageal squamous cell carcinoma (HCC)    COPD exacerbation: Continue prednisone, bronchodilators and IV ceftriaxone.  Chronic hypoxic respiratory failure: Continue 3 L/min oxygen via nasal cannula.  He uses 2.5 L/min oxygen at rest and up to 3 L/min oxygen with activity.  Epigastric pain, known severe radiation esophagitis EGD on 11/08/2022: Probably related to history of radiation.  Liver enzymes and lipase levels were normal.   Continue Protonix  Chronic diastolic CHF: Compensated.  Continue Lasix  Continue Ativan and mirtazapine for depression and anxiety.  Other comorbidities include depression, anxiety, hypertension, history of esophageal cancer followed by oncologist   Diet Order             Diet regular Room service appropriate? Yes; Fluid consistency: Thin   Diet effective now                            Consultants: None  Procedures: None    Medications:    aspirin EC  81 mg Oral Daily   atorvastatin  80 mg Oral Daily   enoxaparin (LOVENOX) injection  40 mg Subcutaneous Q24H   furosemide  20 mg Oral Daily   ipratropium-albuterol  3 mL Nebulization Q6H   isosorbide mononitrate  30 mg Oral Daily   losartan  25 mg Oral Daily   mirtazapine  45 mg Oral QHS   pantoprazole  40 mg Oral Daily   predniSONE  40 mg Oral Q breakfast   sodium chloride flush  3 mL Intravenous Q12H   sucralfate  1 g Oral BID WC   Continuous Infusions:  cefTRIAXone (ROCEPHIN)  IV Stopped (11/14/22 2250)     Anti-infectives (From admission, onward)    Start     Dose/Rate Route Frequency Ordered Stop   11/14/22 2000  cefTRIAXone (ROCEPHIN) 1 g in sodium chloride 0.9 % 100 mL IVPB        1 g 200 mL/hr over 30 Minutes Intravenous Every 24 hours 11/13/22 2001 11/18/22 1959   11/13/22 1945  cefTRIAXone (ROCEPHIN) 1 g in sodium chloride 0.9 % 100 mL IVPB        1 g 200 mL/hr over 30 Minutes Intravenous  Once 11/13/22 1940 11/13/22 2036   11/13/22 1945  azithromycin (ZITHROMAX) 500 mg in sodium chloride 0.9 % 250 mL IVPB  Status:  Discontinued  500 mg 250 mL/hr over 60 Minutes Intravenous  Once 11/13/22 1940 11/13/22 2001              Family Communication/Anticipated D/C date and plan/Code Status   DVT prophylaxis: enoxaparin (LOVENOX) injection 40 mg Start: 11/13/22 2200     Code Status: Full Code  Family Communication: None Disposition Plan: Plan to discharge home tomorrow   Status is: Inpatient Remains inpatient appropriate because: COPD exacerbation         Subjective:   He complains of cough, chest tightness and wheezing.  Breathing is a little better.  However, he said he does not feel well enough to go home today.  Objective:    Vitals:   11/14/22 2132 11/15/22 0030 11/15/22 0215 11/15/22 0832  BP:   134/79  (!) 151/71  Pulse:  97  96  Resp:  20    Temp:  97.8 F (36.6 C)  97.6 F (36.4 C)  TempSrc:      SpO2: 98% 96% 97% 98%   No data found.   Intake/Output Summary (Last 24 hours) at 11/15/2022 1113 Last data filed at 11/15/2022 0700 Gross per 24 hour  Intake 100 ml  Output 2550 ml  Net -2450 ml   There were no vitals filed for this visit.  Exam:  GEN: NAD SKIN: Warm and dry EYES: EOMI ENT: MMM CV: RRR PULM: Decreased air entry, b/l expiratory wheezing ABD: soft, ND, NT, +BS CNS: AAO x 3, non focal EXT: No edema or tenderness           Data Reviewed:   I have personally reviewed following labs and imaging studies:  Labs: Labs show the following:   Basic Metabolic Panel: Recent Labs  Lab 11/13/22 1412 11/14/22 0426  NA 142 143  K 3.9 3.7  CL 108 104  CO2 28 31  GLUCOSE 118* 163*  BUN 17 13  CREATININE 0.72 0.91  CALCIUM 8.9 9.6  MG  --  2.1   GFR Estimated Creatinine Clearance: 85.6 mL/min (by C-G formula based on SCr of 0.91 mg/dL). Liver Function Tests: Recent Labs  Lab 11/13/22 1818  AST 29  ALT 36  ALKPHOS 58  BILITOT 0.6  PROT 6.3*  ALBUMIN 3.7   Recent Labs  Lab 11/13/22 1818  LIPASE 29   No results for input(s): "AMMONIA" in the last 168 hours. Coagulation profile No results for input(s): "INR", "PROTIME" in the last 168 hours.  CBC: Recent Labs  Lab 11/13/22 1412 11/14/22 0426  WBC 5.7 6.8  HGB 13.2 14.2  HCT 41.1 43.8  MCV 94.5 93.6  PLT 187 198   Cardiac Enzymes: No results for input(s): "CKTOTAL", "CKMB", "CKMBINDEX", "TROPONINI" in the last 168 hours. BNP (last 3 results) No results for input(s): "PROBNP" in the last 8760 hours. CBG: No results for input(s): "GLUCAP" in the last 168 hours. D-Dimer: No results for input(s): "DDIMER" in the last 72 hours. Hgb A1c: No results for input(s): "HGBA1C" in the last 72 hours. Lipid Profile: No results for input(s): "CHOL", "HDL", "LDLCALC", "TRIG",  "CHOLHDL", "LDLDIRECT" in the last 72 hours. Thyroid function studies: No results for input(s): "TSH", "T4TOTAL", "T3FREE", "THYROIDAB" in the last 72 hours.  Invalid input(s): "FREET3" Anemia work up: No results for input(s): "VITAMINB12", "FOLATE", "FERRITIN", "TIBC", "IRON", "RETICCTPCT" in the last 72 hours. Sepsis Labs: Recent Labs  Lab 11/13/22 1412 11/14/22 0426  WBC 5.7 6.8    Microbiology Recent Results (from the past 240 hour(s))  Resp Panel by  RT-PCR (Flu A&B, Covid) Anterior Nasal Swab     Status: None   Collection Time: 11/13/22  6:28 PM   Specimen: Anterior Nasal Swab  Result Value Ref Range Status   SARS Coronavirus 2 by RT PCR NEGATIVE NEGATIVE Final    Comment: (NOTE) SARS-CoV-2 target nucleic acids are NOT DETECTED.  The SARS-CoV-2 RNA is generally detectable in upper respiratory specimens during the acute phase of infection. The lowest concentration of SARS-CoV-2 viral copies this assay can detect is 138 copies/mL. A negative result does not preclude SARS-Cov-2 infection and should not be used as the sole basis for treatment or other patient management decisions. A negative result may occur with  improper specimen collection/handling, submission of specimen other than nasopharyngeal swab, presence of viral mutation(s) within the areas targeted by this assay, and inadequate number of viral copies(<138 copies/mL). A negative result must be combined with clinical observations, patient history, and epidemiological information. The expected result is Negative.  Fact Sheet for Patients:  EntrepreneurPulse.com.au  Fact Sheet for Healthcare Providers:  IncredibleEmployment.be  This test is no t yet approved or cleared by the [place] FDA and  has been authorized for detection and/or diagnosis of SARS-CoV-2 by FDA under an Emergency Use Authorization (EUA). This EUA will remain  in effect (meaning this test can be used)  for the duration of the COVID-19 declaration under Section 564(b)(1) of the Act, 21 U.S.C.section 360bbb-3(b)(1), unless the authorization is terminated  or revoked sooner.       Influenza A by PCR NEGATIVE NEGATIVE Final   Influenza B by PCR NEGATIVE NEGATIVE Final    Comment: (NOTE) The Xpert Xpress SARS-CoV-2/FLU/RSV plus assay is intended as an aid in the diagnosis of influenza from Nasopharyngeal swab specimens and should not be used as a sole basis for treatment. Nasal washings and aspirates are unacceptable for Xpert Xpress SARS-CoV-2/FLU/RSV testing.  Fact Sheet for Patients: EntrepreneurPulse.com.au  Fact Sheet for Healthcare Providers: IncredibleEmployment.be  This test is not yet approved or cleared by the [place] FDA and has been authorized for detection and/or diagnosis of SARS-CoV-2 by FDA under an Emergency Use Authorization (EUA). This EUA will remain in effect (meaning this test can be used) for the duration of the COVID-19 declaration under Section 564(b)(1) of the Act, 21 U.S.C. section 360bbb-3(b)(1), unless the authorization is terminated or revoked.  Performed at [facility], [address] [place], [place] [address]     Procedures and diagnostic studies:  DG Chest 2 View  Result Date: 11/13/2022 CLINICAL DATA:  Shortness of breath EXAM: CHEST - 2 VIEW COMPARISON:  10/27/2022 radiograph, 09/02/2022 PET CT and prior studies FINDINGS: The cardiomediastinal silhouette is unremarkable. There is no evidence of focal airspace disease, pulmonary edema, suspicious pulmonary nodule/mass, pleural effusion, or pneumothorax. No interval change from the prior study. No acute bony abnormalities are identified. IMPRESSION: No acute cardiopulmonary disease. Electronically Signed   By: Margarette Canada M.D.   On: 11/13/2022 14:41               LOS: 1 day   Leslie Sims  [facility]   Pager on  [email or web address]. If 7PM-7AM, please contact night-coverage at [email or web address]     11/15/2022, 11:13 AM

## 2022-11-15 NOTE — Care Management Obs Status (Signed)
[facility] NOTIFICATION   Patient Details  Name: Leslie Sims MRN: [id number] Date of Birth: 13-Feb-1951   Medicare Observation Status Notification Given:  Yes    Conception Oms, RN 11/15/2022, 8:55 AM

## 2022-11-16 DIAGNOSIS — J441 Chronic obstructive pulmonary disease with (acute) exacerbation: Secondary | ICD-10-CM | POA: Diagnosis not present

## 2022-11-16 MED ORDER — PREDNISONE 20 MG PO TABS: 40.0000 mg | ORAL_TABLET | Freq: Every day | ORAL | 0 refills | Status: AC

## 2022-11-16 MED ORDER — LORAZEPAM 1 MG PO TABS: 1.0000 mg | ORAL_TABLET | Freq: Three times a day (TID) | ORAL | Status: DC | PRN

## 2022-11-16 NOTE — Discharge Summary (Signed)
Physician Discharge Summary   Patient: Leslie [facility] MRN: [id number] DOB: Jun 20, 1951  Admit date:     11/13/2022  Discharge date: 11/16/2022  Discharge Physician: Jennye Boroughs   PCP: [name], MD   Recommendations at discharge:    Follow up with PCP in 1 week  Discharge Diagnoses: Principal Problem:   COPD with acute exacerbation ([place]) Active Problems:   DEPRESSION/ANXIETY   Essential hypertension   CAD, NATIVE VESSEL   Chronic heart failure with preserved ejection fraction (HFpEF) (HCC)   Chronic respiratory failure with hypoxia (HCC)   Primary esophageal squamous cell carcinoma (HCC)  Resolved Problems:   * No resolved hospital problems. [facility] Course:  [name] is a 71 y.o. male  with medical history significant for COPD, chronic hypoxic respiratory failure, CAD, anxiety, and esophageal cancer, who presented to the emergency department with epigastric discomfort, lightheadedness, cough, wheezing and worsening shortness of breath.   Patient reports that he recently completed a course of antibiotics and steroids with an any appreciable improvement in his shortness of breath or cough.  He has also been experiencing epigastric discomfort since his EGD on 11/08/2022, but this seems to be improving some     He was admitted to the hospital for COPD exacerbation.  He was treated with steroids, bronchodilators and antibiotics.  His condition has improved and he is deemed stable for discharge to home today.     Assessment and Plan:  COPD exacerbation: He completed 3 days of IV ceftriaxone.  Continue prednisone for 2 days at discharge.  Continue bronchodilators at home  Chronic hypoxic respiratory failure: Continue 3 L/min oxygen via nasal cannula.  He uses 2.5 L/min oxygen at rest and up to 3 L/min oxygen with activity.   Epigastric pain, known severe radiation esophagitis EGD on 11/08/2022: Probably related to history of radiation.  Liver  enzymes and lipase levels were normal.   Continue Protonix   Chronic diastolic CHF: Compensated.  Continue Lasix   Continue Ativan and mirtazapine for depression and anxiety.   Other comorbidities include depression, anxiety, hypertension, history of esophageal cancer followed by oncologist           Consultants: None Procedures performed: None  Disposition: Home Diet recommendation:  Discharge Diet Orders (From admission, onward)     Start     Ordered   11/16/22 0000  Diet - low sodium heart healthy        11/16/22 1211           Cardiac diet DISCHARGE MEDICATION: Allergies as of 11/16/2022       Reactions   Prozac [fluoxetine Hcl] Shortness Of Breath   Hydroxyzine    Bupropion Other (See Comments)   "Makes me feel funny" Other reaction(s): Other (See Comments) Unknown/"made me feel real funny" "Makes me feel funny" "Makes me feel funny" Other reaction(s): Other (See Comments) Unknown/"made me feel real funny" "Makes me feel funny"   Effexor Xr [venlafaxine Hcl Er] Other (See Comments)   "Makes me feel funny"        Medication List     TAKE these medications    AeroChamber MV inhaler Use as instructed   albuterol 108 (90 Base) MCG/ACT inhaler Commonly known as: VENTOLIN HFA TAKE 2 PUFFS BY MOUTH EVERY 6 HOURS AS NEEDED FOR WHEEZE OR SHORTNESS OF BREATH   aspirin 81 MG tablet Take 81 mg by mouth daily.   atorvastatin 80 MG tablet Commonly known as: LIPITOR Take 1 tablet (  80 mg total) by mouth daily.   budesonide 0.5 MG/2ML nebulizer solution Commonly known as: PULMICORT Take 2 mLs (0.5 mg total) by nebulization in the morning and at bedtime.   docusate sodium 100 MG capsule Commonly known as: Colace Take 1 capsule (100 mg total) by mouth 2 (two) times daily.   esomeprazole 40 MG capsule Commonly known as: NEXIUM TAKE ONE CAPSULE BY MOUTH ONCE DAILY   fluticasone 50 MCG/ACT nasal spray Commonly known as: FLONASE Place 2 sprays into both  nostrils daily.   furosemide 20 MG tablet Commonly known as: LASIX Take 1 tablet (20 mg total) by mouth daily.   ipratropium-albuterol 0.5-2.5 (3) MG/3ML Soln Commonly known as: DUONEB Take 3 mLs by nebulization 2 (two) times daily.   isosorbide mononitrate 30 MG 24 hr tablet Commonly known as: IMDUR Take 1 tablet (30 mg total) by mouth daily.   ketoconazole 2 % cream Commonly known as: NIZORAL Apply topically 2 (two) times daily.   loratadine 10 MG tablet Commonly known as: CLARITIN Take 10 mg by mouth daily.   LORazepam 1 MG tablet Commonly known as: ATIVAN Take 1 tablet (1 mg total) by mouth 3 (three) times daily as needed for anxiety. What changed:  when to take this reasons to take this   losartan 25 MG tablet Commonly known as: COZAAR Take 25 mg by mouth daily.   MiraLax 17 g packet Generic drug: polyethylene glycol Take 17 g by mouth daily as needed.   mirtazapine 45 MG tablet Commonly known as: REMERON Take 45 mg by mouth at bedtime.   multivitamin capsule Take 1 capsule by mouth daily.   nitroGLYCERIN 0.4 MG SL tablet Commonly known as: NITROSTAT Place 1 tablet (0.4 mg total) under the tongue every 5 (five) minutes as needed.   OXYGEN Inhale 2.5 L into the lungs daily.   predniSONE 20 MG tablet Commonly known as: DELTASONE Take 2 tablets (40 mg total) by mouth daily with breakfast for 2 days. Start taking on: November 17, 2022   sucralfate 1 g tablet Commonly known as: CARAFATE Take 1 g by mouth 2 (two) times daily with a meal.        Discharge Exam: There were no vitals filed for this visit. GEN: NAD SKIN: Warm and dry EYES: No pallor or icterus ENT: MMM CV: RRR PULM: Air entry adequate bilaterally.  Occasional wheezing but no rales ABD: soft, ND, NT, +BS CNS: AAO x 3, non focal EXT: No edema or tenderness   Condition at discharge: good  The results of significant diagnostics from this hospitalization (including imaging,  microbiology, ancillary and laboratory) are listed below for reference.   Imaging Studies: DG Chest 2 View  Result Date: 11/13/2022 CLINICAL DATA:  Shortness of breath EXAM: CHEST - 2 VIEW COMPARISON:  10/27/2022 radiograph, 09/02/2022 PET CT and prior studies FINDINGS: The cardiomediastinal silhouette is unremarkable. There is no evidence of focal airspace disease, pulmonary edema, suspicious pulmonary nodule/mass, pleural effusion, or pneumothorax. No interval change from the prior study. No acute bony abnormalities are identified. IMPRESSION: No acute cardiopulmonary disease. Electronically Signed   By: Margarette Canada M.D.   On: 11/13/2022 14:41   DG Chest Portable 1 View  Result Date: 10/27/2022 CLINICAL DATA:  Wheezing and shortness of breath. EXAM: PORTABLE CHEST 1 VIEW COMPARISON:  10/11/2022 FINDINGS: The heart size and mediastinal contours are within normal limits. Stable emphysematous lung disease without airspace consolidation, edema, pneumothorax or pleural fluid. Stable calcified granuloma in the left upper  lobe. The visualized skeletal structures are unremarkable. IMPRESSION: Stable emphysematous lung disease. No acute findings. Electronically Signed   By: Aletta Edouard M.D.   On: 10/27/2022 12:14    Microbiology: Results for orders placed or performed during the hospital encounter of 11/13/22  Resp Panel by RT-PCR (Flu A&B, Covid) Anterior Nasal Swab     Status: None   Collection Time: 11/13/22  6:28 PM   Specimen: Anterior Nasal Swab  Result Value Ref Range Status   SARS Coronavirus 2 by RT PCR NEGATIVE NEGATIVE Final    Comment: (NOTE) SARS-CoV-2 target nucleic acids are NOT DETECTED.  The SARS-CoV-2 RNA is generally detectable in upper respiratory specimens during the acute phase of infection. The lowest concentration of SARS-CoV-2 viral copies this assay can detect is 138 copies/mL. A negative result does not preclude SARS-Cov-2 infection and should not be used as the sole  basis for treatment or other patient management decisions. A negative result may occur with  improper specimen collection/handling, submission of specimen other than nasopharyngeal swab, presence of viral mutation(s) within the areas targeted by this assay, and inadequate number of viral copies(<138 copies/mL). A negative result must be combined with clinical observations, patient history, and epidemiological information. The expected result is Negative.  Fact Sheet for Patients:  EntrepreneurPulse.com.au  Fact Sheet for Healthcare Providers:  IncredibleEmployment.be  This test is no t yet approved or cleared by the [place] FDA and  has been authorized for detection and/or diagnosis of SARS-CoV-2 by FDA under an Emergency Use Authorization (EUA). This EUA will remain  in effect (meaning this test can be used) for the duration of the COVID-19 declaration under Section 564(b)(1) of the Act, 21 U.S.C.section 360bbb-3(b)(1), unless the authorization is terminated  or revoked sooner.       Influenza A by PCR NEGATIVE NEGATIVE Final   Influenza B by PCR NEGATIVE NEGATIVE Final    Comment: (NOTE) The Xpert Xpress SARS-CoV-2/FLU/RSV plus assay is intended as an aid in the diagnosis of influenza from Nasopharyngeal swab specimens and should not be used as a sole basis for treatment. Nasal washings and aspirates are unacceptable for Xpert Xpress SARS-CoV-2/FLU/RSV testing.  Fact Sheet for Patients: EntrepreneurPulse.com.au  Fact Sheet for Healthcare Providers: IncredibleEmployment.be  This test is not yet approved or cleared by the [place] FDA and has been authorized for detection and/or diagnosis of SARS-CoV-2 by FDA under an Emergency Use Authorization (EUA). This EUA will remain in effect (meaning this test can be used) for the duration of the COVID-19 declaration under Section 564(b)(1) of the Act,  21 U.S.C. section 360bbb-3(b)(1), unless the authorization is terminated or revoked.  Performed at [facility], [address] [place], [place] [address]    *Note: Due to a large number of results and/or encounters for the requested time period, some results have not been displayed. A complete set of results can be found in Results Review.    Labs: CBC: Recent Labs  Lab 11/13/22 1412 11/14/22 0426  WBC 5.7 6.8  HGB 13.2 14.2  HCT 41.1 43.8  MCV 94.5 93.6  PLT 187 086   Basic Metabolic Panel: Recent Labs  Lab 11/13/22 1412 11/14/22 0426  NA 142 143  K 3.9 3.7  CL 108 104  CO2 28 31  GLUCOSE 118* 163*  BUN 17 13  CREATININE 0.72 0.91  CALCIUM 8.9 9.6  MG  --  2.1   Liver Function Tests: Recent Labs  Lab 11/13/22 1818  AST 29  ALT 36  ALKPHOS 58  BILITOT 0.6  PROT 6.3*  ALBUMIN 3.7   CBG: No results for input(s): "GLUCAP" in the last 168 hours.  Discharge time spent: greater than 30 minutes.  Signed: Jennye Boroughs, MD [facility] 11/16/2022

## 2022-11-16 NOTE — Progress Notes (Signed)
The patient is up and independent in the room, he is Open with [facility] for [facility] services, he has DME at home, no additional TOC needs

## 2022-11-17 NOTE — TOC Progression Note (Signed)
Transition of Care [facility]) - Progression Note    Patient Details  Name: Yeshaya Vath MRN: [id number] Date of Birth: 06/25/1951  Transition of Care [facility] CM/SW [place], RN Phone Number: 11/17/2022, 9:47 AM  Clinical Narrative:    Verbal orders for Home health for PT and OT received and provided to [facility]   Expected Discharge Plan: [place] Barriers to Discharge: Barriers Resolved  Expected Discharge Plan and Services Expected Discharge Plan: [place] In-house Referral: Clinical Social Work Discharge Planning Services: CM Consult   Living arrangements for the past 2 months: [place] Expected Discharge Date: 11/16/22               DME Arranged: N/A DME Agency: NA                   Social Determinants of Health (SDOH) Interventions    Readmission Risk Interventions    07/12/2022   11:24 AM 07/11/2022   11:17 AM  Readmission Risk Prevention Plan  Transportation Screening  Complete  PCP or Specialist Appt within 3-5 Days Complete   HRI or Home Care Consult  Complete  Palliative Care Screening  Not Applicable  Medication Review (RN Care Manager)  Complete

## 2022-12-06 ENCOUNTER — Emergency Department: Payer: Medicare Other

## 2022-12-06 ENCOUNTER — Ambulatory Visit
Admission: RE | Admit: 2022-12-06 | Discharge: 2022-12-06 | Disposition: A | Discharge status: Home / Self Care | Payer: Medicare Other | Source: Ambulatory Visit | Attending: Oncology | Admitting: Oncology

## 2022-12-06 ENCOUNTER — Emergency Department
Admission: EM | Admit: 2022-12-06 | Discharge: 2022-12-07 | Disposition: A | Discharge status: Home / Self Care | Payer: Medicare Other | Attending: Emergency Medicine | Admitting: Emergency Medicine

## 2022-12-06 ENCOUNTER — Other Ambulatory Visit: Payer: Self-pay

## 2022-12-06 DIAGNOSIS — D3502 Benign neoplasm of left adrenal gland: Secondary | ICD-10-CM | POA: Insufficient documentation

## 2022-12-06 DIAGNOSIS — C159 Malignant neoplasm of esophagus, unspecified: Secondary | ICD-10-CM

## 2022-12-06 DIAGNOSIS — I700 Atherosclerosis of aorta: Secondary | ICD-10-CM | POA: Insufficient documentation

## 2022-12-06 DIAGNOSIS — J441 Chronic obstructive pulmonary disease with (acute) exacerbation: Secondary | ICD-10-CM | POA: Insufficient documentation

## 2022-12-06 DIAGNOSIS — D3501 Benign neoplasm of right adrenal gland: Secondary | ICD-10-CM | POA: Insufficient documentation

## 2022-12-06 DIAGNOSIS — Z8546 Personal history of malignant neoplasm of prostate: Secondary | ICD-10-CM | POA: Insufficient documentation

## 2022-12-06 DIAGNOSIS — R0602 Shortness of breath: Secondary | ICD-10-CM | POA: Diagnosis present

## 2022-12-06 DIAGNOSIS — Z1152 Encounter for screening for COVID-19: Secondary | ICD-10-CM | POA: Diagnosis not present

## 2022-12-06 DIAGNOSIS — C61 Malignant neoplasm of prostate: Secondary | ICD-10-CM

## 2022-12-06 LAB — BRAIN NATRIURETIC PEPTIDE: B Natriuretic Peptide: 59.2 pg/mL (ref 0.0–100.0)

## 2022-12-06 LAB — COMPREHENSIVE METABOLIC PANEL
ALT: 27 U/L (ref 0–44)
AST: 21 U/L (ref 15–41)
Albumin: 4 g/dL (ref 3.5–5.0)
Alkaline Phosphatase: 55 U/L (ref 38–126)
Anion gap: 9 (ref 5–15)
BUN: 22 mg/dL (ref 8–23)
CO2: 28 mmol/L (ref 22–32)
Calcium: 9.4 mg/dL (ref 8.9–10.3)
Chloride: 103 mmol/L (ref 98–111)
Creatinine, Ser: 0.9 mg/dL (ref 0.61–1.24)
GFR, Estimated: >60 mL/min (ref >60)
Glucose, Bld: 158 mg/dL — ABNORMAL HIGH (ref 70–99)
Potassium: 3.9 mmol/L (ref 3.5–5.1)
Sodium: 140 mmol/L (ref 135–145)
Total Bilirubin: 0.6 mg/dL (ref 0.3–1.2)
Total Protein: 6.8 g/dL (ref 6.5–8.1)

## 2022-12-06 LAB — CBC WITH DIFFERENTIAL/PLATELET
Abs Immature Granulocytes: 0.1 10*3/uL — ABNORMAL HIGH (ref 0.00–0.07)
Basophils Absolute: 0 10*3/uL (ref 0.0–0.1)
Basophils Relative: 0 %
Eosinophils Absolute: 0 10*3/uL (ref 0.0–0.5)
Eosinophils Relative: 0 %
HCT: 41.2 % (ref 39.0–52.0)
Hemoglobin: 13.2 g/dL (ref 13.0–17.0)
Immature Granulocytes: 1 %
Lymphocytes Relative: 6 %
Lymphs Abs: 0.4 10*3/uL — ABNORMAL LOW (ref 0.7–4.0)
MCH: 29.6 pg (ref 26.0–34.0)
MCHC: 32 g/dL (ref 30.0–36.0)
MCV: 92.4 fL (ref 80.0–100.0)
Monocytes Absolute: 0.6 10*3/uL (ref 0.1–1.0)
Monocytes Relative: 8 %
Neutro Abs: 6 10*3/uL (ref 1.7–7.7)
Neutrophils Relative %: 85 %
Platelets: 216 10*3/uL (ref 150–400)
RBC: 4.46 MIL/uL (ref 4.22–5.81)
RDW: 12.6 % (ref 11.5–15.5)
WBC: 7.1 10*3/uL (ref 4.0–10.5)
nRBC: 0 % (ref 0.0–0.2)

## 2022-12-06 LAB — RESP PANEL BY RT-PCR (RSV, FLU A&B, COVID)  RVPGX2
Influenza A by PCR: NEGATIVE
Influenza B by PCR: NEGATIVE
Resp Syncytial Virus by PCR: NEGATIVE
SARS Coronavirus 2 by RT PCR: NEGATIVE

## 2022-12-06 LAB — GLUCOSE, CAPILLARY: Glucose-Capillary: 109 mg/dL — ABNORMAL HIGH (ref 70–99)

## 2022-12-06 LAB — TROPONIN I (HIGH SENSITIVITY): Troponin I (High Sensitivity): 11 ng/L (ref <18)

## 2022-12-06 MED ORDER — IPRATROPIUM-ALBUTEROL 0.5-2.5 (3) MG/3ML IN SOLN
3.0000 mL | Freq: Once | RESPIRATORY_TRACT | Status: AC
  Administered 2022-12-06: 3 mL via RESPIRATORY_TRACT
  Filled 2022-12-06: qty 3

## 2022-12-06 MED ORDER — ALBUTEROL SULFATE (2.5 MG/3ML) 0.083% IN NEBU: 2.5000 mg | INHALATION_SOLUTION | RESPIRATORY_TRACT | 0 refills | Status: DC | PRN

## 2022-12-06 MED ORDER — METHYLPREDNISOLONE SODIUM SUCC 125 MG IJ SOLR: 125.0000 mg | Freq: Once | INTRAMUSCULAR | Status: DC

## 2022-12-06 MED ORDER — FLUDEOXYGLUCOSE F - 18 (FDG) INJECTION
10.1000 | Freq: Once | INTRAVENOUS | Status: AC | PRN
  Administered 2022-12-06: 10.36 via INTRAVENOUS

## 2022-12-06 MED ORDER — PREDNISONE 10 MG PO TABS: ORAL_TABLET | ORAL | 0 refills | Status: AC

## 2022-12-06 MED ORDER — METHYLPREDNISOLONE SODIUM SUCC 125 MG IJ SOLR
125.0000 mg | Freq: Once | INTRAMUSCULAR | Status: AC
  Administered 2022-12-06: 125 mg via INTRAVENOUS
  Filled 2022-12-06: qty 2

## 2022-12-06 MED ORDER — SODIUM CHLORIDE 0.9 % IV SOLN
2.0000 g | Freq: Once | INTRAVENOUS | Status: AC
  Administered 2022-12-06: 2 g via INTRAVENOUS
  Filled 2022-12-06: qty 20

## 2022-12-06 MED ORDER — DOXYCYCLINE HYCLATE 100 MG PO CAPS: 100.0000 mg | ORAL_CAPSULE | Freq: Two times a day (BID) | ORAL | 0 refills | Status: AC

## 2022-12-06 NOTE — Discharge Instructions (Addendum)
Take the higher dose prednisone and antibiotic as prescribed  For your wheezing, use your albuterol inhaler or nebulizer every 4 hours for then next 2-3 days when awake, then as needed  I've prescribed albuterol for your nebulizer as well if you'd prefer to take that

## 2022-12-06 NOTE — ED Triage Notes (Signed)
First nurse note:  Pt here via [employer] with c/o of SOB since 2 weeks. Exp wheezes per EMS, 2 duonebs given PTA. Pt on 4L/min via [place], pt wears O2 at home, EMS states RA sat was 75%.

## 2022-12-06 NOTE — ED Triage Notes (Signed)
Pt presents to the ED via EMS from home due to SOB x2 weeks. Pt has hx COPD. Pt states its not getting any better. Pt quit smoking 3.5 years ago. Pt c/o little cough. Pt denies N/V/D. Pt wears 2L [place] at all times at home. Pt is currently requiring 4L [place]. Pt A&Ox4

## 2022-12-06 NOTE — ED Provider Triage Note (Signed)
Emergency Medicine Provider Triage Evaluation Note  Nitin Mckowen [facility] , a 71 y.o. male  was evaluated in triage.  Pt complains of cough, shob, increased O2 demand.  Review of Systems  Positive: Cough, shob Negative: CP, fever  Physical Exam  BP (!) 164/72 (BP Location: Left Arm)   Pulse 98   Temp 97.8 F (36.6 C) (Oral)   Resp 18   SpO2 96%  Gen:   Awake, no distress   Resp:  Normal effort  MSK:   Moves extremities without difficulty  Other:    Medical Decision Making  Medically screening exam initiated at 4:54 PM.  Appropriate orders placed.  Kamdon Reisig Vlcek was informed that the remainder of the evaluation will be completed by another provider, this initial triage assessment does not replace that evaluation, and the importance of remaining in the ED until their evaluation is complete.  Labs, EKG, chest xray   Darletta Moll, PA-C 12/06/22 1654

## 2022-12-06 NOTE — ED Provider Notes (Signed)
[facility] Provider Note    Event Date/Time   First MD Initiated Contact with Patient 12/06/22 2115     (approximate)   History   Shortness of Breath   HPI  Leslie Sims is a 71 y.o. male  with PMHx chronic COPD on 2.5L [place] here with cough, SOB. Pt reports that over the past 2 weeks he has had progressively worsening cough, SOB, and dyspnea. He has actually been on a course of prednisone and abx, and was just restarted "a few days ago" though on what he describes is a very low dose of prednisone. Since then he's had worsening SOB, wheezing. He has fairly chronic chest pain but it is not acutely worse. No fevers, chills. He has had cough but no sputum production. No alleviating factors. No other complaints.      Physical Exam   Triage Vital Signs: ED Triage Vitals [12/06/22 1627]  Enc Vitals Group     BP (!) 164/72     Pulse Rate 98     Resp 18     Temp 97.8 F (36.6 C)     Temp Source Oral     SpO2 96 %     Weight      Height      Head Circumference      Peak Flow      Pain Score      Pain Loc      Pain Edu?      Excl. in [place]?     Most recent vital signs: Vitals:   12/06/22 2230 12/07/22 0022  BP: (!) 120/93 128/78  Pulse: 81 87  Resp:  15  Temp:  98.1 F (36.7 C)  SpO2: 99% 95%     General: Awake, no distress.  CV:  Good peripheral perfusion. RRR. Resp:  Normal effort. Tachypneic, speaking in short sentences. Globally diminished aeration with bilateral wheezes. Abd:  No distention. No tenderness. Other:  No LE edema.   ED Results / Procedures / Treatments   Labs (all labs ordered are listed, but only abnormal results are displayed) Labs Reviewed  CBC WITH DIFFERENTIAL/PLATELET - Abnormal; Notable for the following components:      Result Value   Lymphs Abs 0.4 (*)    Abs Immature Granulocytes 0.10 (*)    All other components within normal limits  COMPREHENSIVE METABOLIC PANEL - Abnormal; Notable for the following  components:   Glucose, Bld 158 (*)    All other components within normal limits  RESP PANEL BY RT-PCR (RSV, FLU A&B, COVID)  RVPGX2  BRAIN NATRIURETIC PEPTIDE  TROPONIN I (HIGH SENSITIVITY)  TROPONIN I (HIGH SENSITIVITY)     EKG Normal sinus rhythm, VR 99. PR 152, QRS 110, QTc 464. No acute St elevations. Non specific TWI in lateral precordial leads.   RADIOLOGY CXR: No active disease.   I also independently reviewed and agree with radiologist interpretations.   PROCEDURES:  Critical Care performed: No    MEDICATIONS ORDERED IN ED: Medications  ipratropium-albuterol (DUONEB) 0.5-2.5 (3) MG/3ML nebulizer solution 3 mL (3 mLs Nebulization Given 12/06/22 2211)  ipratropium-albuterol (DUONEB) 0.5-2.5 (3) MG/3ML nebulizer solution 3 mL (3 mLs Nebulization Given 12/06/22 2211)  ipratropium-albuterol (DUONEB) 0.5-2.5 (3) MG/3ML nebulizer solution 3 mL (3 mLs Nebulization Given 12/06/22 2211)  methylPREDNISolone sodium succinate (SOLU-MEDROL) 125 mg/2 mL injection 125 mg (125 mg Intravenous Given 12/06/22 2211)  cefTRIAXone (ROCEPHIN) 2 g in sodium chloride 0.9 % 100 mL IVPB (0 g Intravenous  Stopped 12/06/22 2316)     IMPRESSION / MDM / ASSESSMENT AND PLAN / ED COURSE  I reviewed the triage vital signs and the nursing notes.                              Differential diagnosis includes, but is not limited to, COPD exacerbation, CAP, PE, CHF, ACS.  Patient's presentation is most consistent with acute presentation with potential threat to life or bodily function.  71 yo well appearing male with PMHx COPD, esophageal CA, chronic hypoxia here with SOB. Clinically, suspect acute on chronic COPD exacerbation, unlikely superimposed PNA. COVID, flu, RSV is negative. CXR shows no focal PNA. CBC without significant leukocytosis. CMP at baseline.   Pt given IV steroids, nebs, with significant improvement. He is at his baseline sats on his 2.5L [place]. Suspect pt jsut needs higher dose of  steroids for a more prolonged course, along with abx for his sputum production/bronchitis with COPD.   Discussed management options, pt has close PCP and specialist f/u this week and would like to tx as outpatient at this time which I think is reasonable. Return precautions given.  FINAL CLINICAL IMPRESSION(S) / ED DIAGNOSES   Final diagnoses:  COPD exacerbation ([place])     Rx / DC Orders   ED Discharge Orders          Ordered    predniSONE (DELTASONE) 10 MG tablet  Daily        12/06/22 2255    doxycycline (VIBRAMYCIN) 100 MG capsule  2 times daily        12/06/22 2255    albuterol (PROVENTIL) (2.5 MG/3ML) 0.083% nebulizer solution  Every 4 hours PRN        12/06/22 2255             Note:  This document was prepared using Dragon voice recognition software and may include unintentional dictation errors.   Duffy Bruce, MD 12/07/22 [phone]

## 2022-12-07 ENCOUNTER — Ambulatory Visit
Admission: RE | Admit: 2022-12-07 | Discharge: 2022-12-07 | Disposition: A | Discharge status: Home / Self Care | Payer: Medicare Other | Source: Ambulatory Visit | Attending: Radiation Oncology | Admitting: Radiation Oncology

## 2022-12-07 ENCOUNTER — Encounter: Payer: Self-pay | Admitting: Radiation Oncology

## 2022-12-07 VITALS — BP 142/77 | HR 100 | Temp 98.8°F | Resp 20 | Ht 70.0 in | Wt 192.1 lb

## 2022-12-07 DIAGNOSIS — Z8501 Personal history of malignant neoplasm of esophagus: Secondary | ICD-10-CM | POA: Insufficient documentation

## 2022-12-07 DIAGNOSIS — Z923 Personal history of irradiation: Secondary | ICD-10-CM | POA: Diagnosis not present

## 2022-12-07 DIAGNOSIS — J441 Chronic obstructive pulmonary disease with (acute) exacerbation: Secondary | ICD-10-CM | POA: Diagnosis not present

## 2022-12-07 DIAGNOSIS — C155 Malignant neoplasm of lower third of esophagus: Secondary | ICD-10-CM

## 2022-12-07 NOTE — Progress Notes (Signed)
Radiation Oncology Follow up Note  Name: Leslie Sims [facility]   Date:   12/07/2022 MRN:  [id number] DOB: Mar 10, 1951    This 71 y.o. male presents to the clinic today for 87-monthfollow-up status post concurrent chemoradiation therapy for distal esophageal squamous cell carcinoma.  REFERRING PROVIDER: SWardell Honour MD  HPI: Patient is a 71year old male now out for months having completed concurrent chemoradiation therapy for squamous cell carcinoma of distal esophagus.  He is doing well.  He is having no dysphagia at this time.  He was in the emergency room yesterday for exacerbation of COPD was put on steroids and antibiotic therapy and that seems to be stable..  He had last month upper endoscopy showing no evidence of disease.  Some minimal radiation changes.  He had a PET scan yesterday which I reviewed personally and shows no hypermetabolic activity in the distal esophagus.  COMPLICATIONS OF TREATMENT: none  FOLLOW UP COMPLIANCE: keeps appointments   PHYSICAL EXAM:  BP (!) 142/77 (BP Location: Left Arm, Patient Position: Sitting, Cuff Size: Normal)   Pulse 100   Temp 98.8 F (37.1 C) (Tympanic)   Resp 20   Ht '5\' 10"'$  (1.778 m)   Wt 192 lb 1.6 oz (87.1 kg)   BMI 27.56 kg/m  Well-developed well-nourished patient in NAD. HEENT reveals PERLA, EOMI, discs not visualized.  Oral cavity is clear. No oral mucosal lesions are identified. Neck is clear without evidence of cervical or supraclavicular adenopathy. Lungs are clear to A&P. Cardiac examination is essentially unremarkable with regular rate and rhythm without murmur rub or thrill. Abdomen is benign with no organomegaly or masses noted. Motor sensory and DTR levels are equal and symmetric in the upper and lower extremities. Cranial nerves II through XII are grossly intact. Proprioception is intact. No peripheral adenopathy or edema is identified. No motor or sensory levels are noted. Crude visual fields are within normal  range.  RADIOLOGY RESULTS: PET scan reviewed compatible with above-stated findings  PLAN: Present time patient is doing well with no evidence of disease.  I have asked to see him back in 6 months for follow-up.  He continues close follow-up care with medical oncology.  Patient is to call with any concerns.  I would like to take this opportunity to thank you for allowing me to participate in the care of your patient..Noreene Filbert MD

## 2022-12-07 NOTE — Progress Notes (Signed)
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  Past Medical History    Past Medical History:  Diagnosis Date   Allergic rhinitis    Anxiety    Back pain    Cancer (HCC)    prostate   CHF (congestive heart failure) ([place])    Colon polyps 08/2012   colonoscopy; multiple colon polyps; repeat colonoscopy in one year.   Complication of anesthesia    COPD (chronic obstructive pulmonary disease) ([place])    a. on home O2.   Coronary artery disease 11/2009   a. late presenting ant MI; b. LHC 100% pLAD s/p PCI/DES, 99% mRCA s/p PCI/DES, EF 35%; c. nuclear stress test 05/13: prior ant/inf infarcts w/o ischemia, EF 43%; d. LHC 02/14: widely patent stents with no other obs dz, EF 40%; e. LHC 11/17: LM nl, mLAD 10%, patent LAD stent, dLAD 20%, p-mRCA 10%, mRCA 40%, patent RCA stent, EF 35-45%    Depression    Diabetes ([place])    Dysphagia    Family history of adverse reaction to anesthesia    GERD (gastroesophageal reflux disease)    [id number](009.2)    Helicobacter pylori (H. pylori)    Hemorrhoids    Hernia    inguinal   HFrEF (heart failure with reduced ejection fraction) ([place])    a. 10/2016 LV gram: EF 35-45%; b. 06/2018 Echo: EF 40-45%. c. 01/2021 Echo: EF 40-45%   Hyperlipidemia    Hypertension    Insomnia    Ischemic cardiomyopathy    a. 10/2016 LV gram: EF 35-45%; b. 06/2018 Echo: EF 40-45%.   Myalgia    Oxygen dependent    Peptic ulcer    Pulmonary nodules    ST elevation (STEMI)  myocardial infarction involving left anterior descending coronary artery ([place]) 11/2009   a. s/p PCI to the LAD   Status post dilation of esophageal narrowing 2000   Thickening of esophagus    Tinea pedis    Wears dentures    partial upper   Past Surgical History:  Procedure Laterality Date    coronary stents     Admission  12/20/2012   COPD exacerbation.  [place].   CARDIAC CATHETERIZATION     CARDIAC CATHETERIZATION  02/05/2013   [facility]   CARDIAC CATHETERIZATION  09/19/2013   [facility] : patent stents with no change in anatomy. EF: 40$   CARDIAC CATHETERIZATION Left 11/12/2016   Procedure: Left Heart Cath and Coronary Angiography;  Surgeon: Wellington Hampshire, MD;  Location: [place] [facility];  Service: Cardiovascular;  Laterality: Left;   COLONOSCOPY     COLONOSCOPY WITH PROPOFOL N/A 05/18/2016   Procedure: COLONOSCOPY WITH PROPOFOL;  Surgeon: Lucilla Lame, MD;  Location: [facility] ENDOSCOPY;  Service: Endoscopy;  Laterality: N/A;   COLONOSCOPY WITH PROPOFOL N/A 03/19/2020   Procedure: COLONOSCOPY WITH PROPOFOL;  Surgeon: Lin Landsman, MD;  Location: [facility] ENDOSCOPY;  Service: Gastroenterology;  Laterality: N/A;   CORONARY ANGIOPLASTY WITH STENT PLACEMENT  09/19/2009   LAD 3.0 X23 mm Xience DES, RCA: 4.0 X 15 mm Xience  DES   ELECTROMAGNETIC NAVIGATION BROCHOSCOPY N/A 10/27/2017   Procedure: ELECTROMAGNETIC NAVIGATION BRONCHOSCOPY;  Surgeon: Flora Lipps, MD;  Location: [facility] ORS;  Service: Cardiopulmonary;  Laterality: N/A;   ESOPHAGEAL DILATION     ESOPHAGOGASTRODUODENOSCOPY  12/20/2006   ESOPHAGOGASTRODUODENOSCOPY  08/20/2012   ESOPHAGOGASTRODUODENOSCOPY (EGD) WITH PROPOFOL N/A 04/19/2022   Procedure: ESOPHAGOGASTRODUODENOSCOPY (EGD) WITH PROPOFOL;  Surgeon: [name], MD;  Location: [facility] ENDOSCOPY;  Service: Endoscopy;  Laterality: N/A;   ESOPHAGOGASTRODUODENOSCOPY (EGD) WITH PROPOFOL N/A 04/27/2022   Procedure: ESOPHAGOGASTRODUODENOSCOPY (EGD) WITH PROPOFOL;  Surgeon:  [name], MD;  Location: [facility] ENDOSCOPY;  Service: Endoscopy;  Laterality: N/A;   ESOPHAGOGASTRODUODENOSCOPY (EGD) WITH PROPOFOL N/A 11/08/2022   Procedure: ESOPHAGOGASTRODUODENOSCOPY (EGD) WITH PROPOFOL;  Surgeon: [name], MD;  Location: [facility] ENDOSCOPY;  Service: Endoscopy;  Laterality: N/A;   EUS N/A 05/20/2022   Procedure: UPPER ENDOSCOPIC ULTRASOUND (EUS) LINEAR;  Surgeon: Reita Cliche, MD;  Location: [facility] ENDOSCOPY;  Service: Gastroenterology;  Laterality: N/A;  [facility]   HERNIA REPAIR  07/20/2012   L inguinal hernia repair   PENILE PROSTHESIS IMPLANT      Allergies  Allergies  Allergen Reactions   Prozac [Fluoxetine Hcl] Shortness Of Breath   Hydroxyzine    Bupropion Other (See Comments)    "Makes me feel funny" Other reaction(s): Other (See Comments) Unknown/"made me feel real funny" "Makes me feel funny" "Makes me feel funny" Other reaction(s): Other (See Comments) Unknown/"made me feel real funny" "Makes me feel funny"   Effexor Xr [Venlafaxine Hcl Er] Other (See Comments)    "Makes me feel funny"    History of Present Illness    Leslie Sims is a 71 year old male with a previously complex mentioned past medical history of coronary artery disease, ischemic cardiomyopathy, severe COPD stage IV gold, on chronic 2 L of O2 via nasal cannula, previous tobacco use, anxiety/depression, hyperlipidemia, prostate cancer, history of ventricular bigeminy, esophageal squamous cell carcinoma who is undergone a chemotherapy and completed XRT 07/26/2022.  Coronary artery disease started with an anterior MI in 2014 with finding of occluded LAD with 90% stenosis in the RCA.  He had DES placed to the LAD and RCA at that time.  He subsequently underwent repeat LHC November 2017 which showed patent LAD and RCA stents.  LVEF of 35-40%, repeat echocardiogram revealed improved EF 40-45%, G2 DD, without valvular abnormalities.  He has had multiple hospitalizations for  COPD exacerbations.  He quit smoking in 2020.  Continues to be followed by Dr. Patsey Berthold at the pulmonary clinic.  Lexiscan Myoview was completed 08/03/2022 which showed a moderate risk scan given the large fixed anterior wall defect, hypokinesis of the distal anterior wall, EF estimated at 55%, no EKG changes concerning for ischemia at peak stress or in recovery, no significant ischemia noted.  Echocardiogram was completed 09/09/2022 which revealed LVEF of 50-55%, no regional wall motion abnormalities, G1 DD, and no valvular abnormalities noted.  He was last seen in clinic 10/12/2022 stating overall he was not feeling his best.  He just recently had an urgent care visit for a COPD flare.  Previously he had had complaints of chest discomfort and was started on Imdur. At his follow-up his discomfort had improved and after a few days he no longer was suffering from a headache.  He was hospitalized at [facility] from 11/13/2022 until 11/16/2022 for epigastric discomfort, lightheadedness, and shortness of breath.  He stated he had previously completed a course of antibiotics and steroids without any appreciable improvement  in his shortness of breath or cough.  He had also been experiencing epigastric discomfort since that the last EGD he had undergone 11/08/2022.  He also correlated his general fatigue and body aches and lightheadedness on standing to his COVID and influenza vaccines that he had previously received roughly a week prior.  Workup in the emergency department was unrevealing and he was treated with DuoNebs, IV steroids, and antibiotics.  His esophageal discomfort was thought to be esophagitis from previous radiation and subsequent EGD.  He was discharged home on continued steroid therapy.  He returns to clinic today with continued symptoms of COPD exacerbation. He remains on antibiotic therapy and on steroids. His breathing medication was recently changed by the pulmonary clinic a few days prior. He states  that he has noticed no improvement but it has only been a few days at this point. He is looking in to a research  study at [facility] on COPD with recurrent flares. He states that his anxiety and depression is worse this time of the year. Denies any chest pain since starting imdur except muscle pain when he coughs.  He is continued on oxygen 2.5 to 3 L via nasal cannula.  He is to follow with pulmonary and oncology.  States that he just had his follow-up PET scan and his repeated appointment to discuss those results.  Home Medications    Current Outpatient Medications  Medication Sig Dispense Refill   albuterol (VENTOLIN HFA) 108 (90 Base) MCG/ACT inhaler TAKE 2 PUFFS BY MOUTH EVERY 6 HOURS AS NEEDED FOR WHEEZE OR SHORTNESS OF BREATH 18 each 4   aspirin 81 MG tablet Take 81 mg by mouth daily.     atorvastatin (LIPITOR) 80 MG tablet Take 1 tablet (80 mg total) by mouth daily. 90 tablet 3   budesonide (PULMICORT) 0.5 MG/2ML nebulizer solution Take 2 mLs (0.5 mg total) by nebulization in the morning and at bedtime. 320 mL 12   docusate sodium (COLACE) 100 MG capsule Take 1 capsule (100 mg total) by mouth 2 (two) times daily. 60 capsule 0   esomeprazole (NEXIUM) 40 MG capsule TAKE ONE CAPSULE BY MOUTH ONCE DAILY 30 capsule 4   fluticasone (FLONASE) 50 MCG/ACT nasal spray Place 2 sprays into both nostrils daily.     ipratropium-albuterol (DUONEB) 0.5-2.5 (3) MG/3ML SOLN Take 3 mLs by nebulization 2 (two) times daily.     ipratropium-albuterol (DUONEB) 0.5-2.5 (3) MG/3ML SOLN Take 3 mLs by nebulization every 6 (six) hours as needed. 360 mL 11   ketoconazole (NIZORAL) 2 % cream Apply topically 2 (two) times daily.     loratadine (CLARITIN) 10 MG tablet Take 10 mg by mouth daily.     LORazepam (ATIVAN) 1 MG tablet Take 1 tablet (1 mg total) by mouth 3 (three) times daily as needed for anxiety.     mirtazapine (REMERON) 45 MG tablet Take 45 mg by mouth at bedtime.     Multiple Vitamin (MULTIVITAMIN) capsule Take  1 capsule by mouth daily.     nitroGLYCERIN (NITROSTAT) 0.4 MG SL tablet Place 1 tablet (0.4 mg total) under the tongue every 5 (five) minutes as needed. 25 tablet 3   OXYGEN Inhale 2.5 L into the lungs daily.     polyethylene glycol (MIRALAX) 17 g packet Take 17 g by mouth daily as needed.     predniSONE (DELTASONE) 10 MG tablet Take 6 tablets (60 mg total) by mouth daily for 3 days, THEN 4 tablets (40 mg total) daily for  3 days, THEN 2 tablets (20 mg total) daily for 3 days, THEN 1 tablet (10 mg total) daily for 3 days. 39 tablet 0   Spacer/Aero-Holding Chambers (AEROCHAMBER MV) inhaler Use as instructed 1 each 2   sucralfate (CARAFATE) 1 g tablet Take 1 g by mouth 2 (two) times daily with a meal.     furosemide (LASIX) 20 MG tablet Take 1 tablet (20 mg total) by mouth daily. 90 tablet 3   isosorbide mononitrate (IMDUR) 30 MG 24 hr tablet Take 1 tablet (30 mg total) by mouth daily. 90 tablet 3   losartan (COZAAR) 25 MG tablet Take 1 tablet (25 mg total) by mouth daily. 90 tablet 3   No current facility-administered medications for this visit.     Family History    Family History  Problem Relation Age of Onset   Heart attack Brother        Brother #1   Diabetes Brother    Hypertension Brother        #3   Coronary artery disease Father 67       deceased   Heart attack Father    Diabetes Father    Heart disease Father    COPD Mother 38       deceased   Alcohol abuse Sister        polysubstance abuse   COPD Sister    Lung cancer Sister    Alcohol abuse Sister        polysubstance abuse   Penile cancer Brother    Diabetes Brother    Prostate cancer Neg Hx    Bladder Cancer Neg Hx    Kidney cancer Neg Hx    He indicated that his mother is deceased. He indicated that his father is deceased. He indicated that both of his sisters are deceased. He indicated that three of his five brothers are alive. He indicated that the status of his neg hx is unknown.  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  Vaping Use   Vaping Use: Never used  Substance and Sexual Activity   Alcohol use: No    Alcohol/week: 0.0 standard drinks of alcohol   Drug use: No   Sexual activity: Yes  Other Topics Concern   Not on file  Social History Narrative   Marital status: married x 32 years; happily married.      Children: 4 children;  1 stepchild and 15 grandchildren; no great grandchildren.      Lives: with wife; 2 dogs, 1 cat.      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      Tobacco: 1 ppd x 45 years      Alcohol: none; previous alcoholism      Drugs: none      Exercise:  no formal exercise; physically demanding job; owns horses.      Seatbelts:  100%      Guns: one loaded unsecured gun in the home.      Sunscreen: none   Social Determinants of Health   Financial Resource Strain: Not on file  Food Insecurity: No Food Insecurity (11/14/2022)   Hunger Vital Sign    Worried About Running Out of Food in the Last Year: Never true    Ran Out of Food in the Last Year: Never true  Transportation Needs: No Transportation Needs (11/14/2022)   PRAPARE - Hydrologist (Medical): No    Lack of Transportation (Non-Medical): No  Physical Activity: Not on file  Stress: Not on file  Social Connections: Not on file  Intimate Partner Violence: Not At Risk (11/14/2022)   Humiliation, Afraid, Rape, and Kick questionnaire     Fear of Current or Ex-Partner: No    Emotionally Abused: No    Physically Abused: No    Sexually Abused: No     Review of Systems    General:  No chills, fever, night sweats or weight changes.  Endorses fatigue Cardiovascular:  No chest pain, endorses chronic dyspnea on exertion, edema, orthopnea, palpitations, paroxysmal nocturnal dyspnea. Dermatological: No rash, lesions/masses Respiratory: Endorses cough, endorses chronic dyspnea Urologic: No hematuria, dysuria Abdominal:   No nausea, vomiting, diarrhea, bright red blood per rectum, melena, or hematemesis Neurologic:  No visual changes, wkns, changes in mental status. All other systems reviewed and are otherwise negative except as noted above.   Physical Exam    VS:  BP 130/74 (BP Location: Left Arm, Patient Position: Sitting, Cuff Size: Normal)   Pulse (!) 109   Ht '5\' 10"'$  (1.778 m)   Wt 196 lb (88.9 kg)   SpO2 98%   BMI 28.12 kg/m  , BMI Body mass index is 28.12 kg/m.     GEN: Well nourished, well developed, in no acute distress. HEENT: normal. Neck: Supple, no JVD, carotid bruits, or masses. Cardiac: RRR, no murmurs, rubs, or gallops. No clubbing, cyanosis, edema.  Radials 2+/PT 2+ and equal bilaterally.  Respiratory:  Respirations regular and unlabored, diminished to auscultation bilaterally, no wheezing, rales, or rhonchi noted, respirations are unlabored at rest on 2.5 L of O2 via [place] GI: Soft, nontender, nondistended, BS + x 4. MS: no deformity or atrophy. Skin: warm and dry, no rash. Neuro:  Strength and sensation are intact. Psych: Normal affect.  Accessory Clinical Findings    ECG personally reviewed by me today- no new tracings today  Lab Results  Component Value Date   WBC 7.1 12/06/2022   HGB 13.2 12/06/2022   HCT 41.2 12/06/2022   MCV 92.4 12/06/2022   PLT 216 12/06/2022   Lab Results  Component Value Date   CREATININE 0.90 12/06/2022   BUN 22 12/06/2022   NA 140 12/06/2022   K 3.9 12/06/2022    CL 103 12/06/2022   CO2 28 12/06/2022   Lab Results  Component Value Date   ALT 27 12/06/2022   AST 21 12/06/2022   ALKPHOS 55 12/06/2022   BILITOT 0.6 12/06/2022   Lab Results  Component Value Date   CHOL 165 03/18/2021   HDL 69 03/18/2021   LDLCALC 81 03/18/2021   TRIG 77 03/18/2021   CHOLHDL 2.4 03/18/2021    Lab Results  Component Value Date   HGBA1C 6.2 (H) 01/30/2019    Assessment & Plan   1.  Coronary disease with remote stenting.  DES placed to LAD and RCA.  Has been continued on Imdur 30 mg daily with continued complaints of chest discomfort or headache.  Last heart catheterization was in 2017 which showed just patent LAD and RCA and nonobstructive coronary artery disease.  He is requesting refill of Imdur to the pharmacy of choice today.  We have also continued on aspirin and statin therapy as well as Nitrostat 0.4 mg sublingual as needed for chest discomfort.  2.  HFrEF.  Echocardiogram with LVEF of 40-45%.  Euvolemic on exam. Unable to  Tolerate beta blockers due to severe COPD.  Continued on lasix and losartan.  Unable to escalate GDMT at this time as he is currently continued steroid and antibiotic therapy for COPD flare.   3.  Severe COPD.  Patient is on chronic oxygen therapy of 2.5 to 3 L/min with chronic shortness of breath.  Which was recently evaluated by the emergency department and followed up with pulmonary and had medication changes made prior to Christmas.  He states that he is only been on the medications for couple of days and has not noticed full improvement at this point.  He also states that he is looking into a research study possibly at [facility] on COPD under the recommendation of pulmonary.  Advised him to continue with all of his follow-ups with pulmonary, continue steroids and antibiotic therapy.  4.  Hypertension blood pressure 130/74 labs remained stable.  He has been continued on furosemide losartan which she requests refills is sent to the  pharmacy of choice.  5.  Hyperlipidemia with LDL 67, he is  continued on atorvastatin 80 mg daily with a lipid panel ordered at next upcoming lab appointment.  6.  Tachycardia noted on exam.  Patient states he has been running between 90-110 since being restarted on steroids and having no new inhaler for COPD.  He is asymptomatic.  He states that it is likely driven from his breathing and anxiety. He has an upcoming appointment to follow up on changes to anti-anxiety medications.  7.  Physician patient return to clinic to see MD/APP in 3 months or sooner if needed  Jourdain Guay, NP 12/14/2022, 2:41 PM

## 2022-12-09 ENCOUNTER — Ambulatory Visit (INDEPENDENT_AMBULATORY_CARE_PROVIDER_SITE_OTHER): Payer: Medicare Other | Admitting: Pulmonary Disease

## 2022-12-09 ENCOUNTER — Encounter: Payer: Self-pay | Admitting: Pulmonary Disease

## 2022-12-09 VITALS — BP 128/78 | HR 104 | Ht 70.0 in | Wt 195.4 lb

## 2022-12-09 DIAGNOSIS — J449 Chronic obstructive pulmonary disease, unspecified: Secondary | ICD-10-CM | POA: Diagnosis not present

## 2022-12-09 DIAGNOSIS — I5042 Chronic combined systolic (congestive) and diastolic (congestive) heart failure: Secondary | ICD-10-CM

## 2022-12-09 DIAGNOSIS — J9611 Chronic respiratory failure with hypoxia: Secondary | ICD-10-CM

## 2022-12-09 DIAGNOSIS — C159 Malignant neoplasm of esophagus, unspecified: Secondary | ICD-10-CM | POA: Diagnosis not present

## 2022-12-09 MED ORDER — IPRATROPIUM-ALBUTEROL 0.5-2.5 (3) MG/3ML IN SOLN: 3.0000 mL | Freq: Four times a day (QID) | RESPIRATORY_TRACT | 11 refills | Status: DC | PRN

## 2022-12-09 NOTE — Progress Notes (Signed)
Subjective:    Patient ID: Leslie Sims, male    DOB: July 22, 1951, 71 y.o.   MRN: [id number] Patient Care Team: [name], MD as PCP - General (Family Medicine) Iran Ouch, MD as PCP - Cardiology (Cardiology) [place] Ouch, MD as Consulting Physician (Cardiology) Glory Buff, RN as Registered Nurse Salena Saner, MD as Consulting Physician (Pulmonary Disease) Carmina Miller, MD as Radiation Oncologist (Radiation Oncology) Benita Gutter, RN as Oncology Nurse Navigator Rickard Patience, MD as Consulting Physician (Oncology) Sherlean Foot, MD (Neurology) Rickard Patience, MD as Consulting Physician (Oncology)  Chief Complaint  Patient presents with   Follow-up    SOB, wheezing and dry cough. ED on 11/25 and 12/18 for COPD Exacerbation.     HPI The patient is a 71 year old former smoker (quit 2020, 126 PY) with severe stage IV COPD and chronic hypoxic respiratory failure on supplemental oxygen, who presents here for follow-up on these issues.  This is a scheduled visit.  Since his last visit he had to be seen in the emergency room on 25 November and 18 December with COPD exacerbations both were related to exposures to fumes (wood stove, [employer]).  He started antibiotics and prednisone prescribed by the ED and is still on these.  I have reviewed the ED reports.  We last saw him on 14 October 2022 at that time he required a prednisone taper and doxycycline taper due to ongoing issues with COPD exacerbation.  Since his most recent visit at the ED he feels that he is back to baseline. He has not had any fevers, chills or sweats.  Sputum has not been overly discolored.  No hemoptysis.  No nausea, vomiting or hematemesis. He is maintained on DuoNebs and budesonide via nebulizer as he does not have breath-holding capacity for proper use of DPI/MDI.  He requests a refill on his DuoNeb.  His respiratory care is complicated by ischemic cardiomyopathy with LVEF of 40% and  grade II diastolic dysfunction giving him chronic combined systolic and diastolic heart failure.  In addition the patient has had diagnosis of esophageal squamous cell carcinoma sitter cured after concurrent chemotherapy and radiation.  Completed these and August 2023.  He is on continued CT surveillance every 3 months.  He also has a history of prostate cancer status post radiation in the past.  DATA/EVENTS: 07/24/12 Office Spirometry: Severe obstruction (FEV1 31% pred) 08/31/13 Office Spirometry: very severe obstruction (FEV1 25% pred) 06/18/15 CT chest: Severe emphysema 03/03/17 Overnight oximetry: Lowest SpO2 72%. 95% of time SpO2 was below 90% 03/03/17 : 384 meters. Desat to 84% 10/04/17 CT chest: Two new irregularly marginated nodules, one in the RUL, and a second in the mid LUL worrisome for either synchronous lung ca or metastatic involvement of the lungs. The two small nodules described on the prior dictated report have resolved in the left superior segment and most likely were postinflammatory. Diffuse centrilobular emphysema. 10/13/17 PET scan: The new left upper lobe nodule is FDG avid consistent with malignancy. Recommend tissue confirmation. The new nodule in the right upper lobe demonstrates low level uptake. While the level of uptake suggests the possibility of an inflammatory or infectious process, a low-grade malignancy is not excluded. Given the suspected malignancy on the left, tissue confirmation should be considered on the right despite the low level of uptake. No distant metastatic disease identified 10/27/17 ENB ([facility]): Transbronchial Fine Needle Aspirations 21G X7, Transbronchial Forceps Biopsy X6. ALVEOLATED LUNG TISSUE WITH HEMOSIDERIN LADEN MACROPHAGES.  NEGATIVE FOR ATYPIA AND MALIGNANCY 11/01/17 PFTs: FVC: 2.96 > 3.14 L (68 > 72 %pred), FEV1: 0.94 > 0.93 L (27 %pred), FEV1/FVC: 32%, TLC: 9.04 L (137 %pred), DLCO 62 %pred 12/07/17 CTA chest: No pulmonary embolus  identified. Anterior left upper lobe ground-glass and nodular consolidation likely representing pneumonitis. Stable bilateral upper lobe pulmonary nodules. Stable emphysema. 01/21/18 CT chest: new masslike architectural distortion and ground-glass attenuation surrounding the index nodule within the RUL. The appearance favors an inflammatory or infectious process. The index nodule in the left upper lobe is slightly decreased in volume when compared with the previous exam favoring a benign process. Attention on follow-up imaging advise. Advanced changes of emphysema (very severe) 06/01/18 CTA chest: No evidence of pulmonary embolism. Waxing and waning bilateral pulmonary nodular process as described. New 8 mm nodular ill-defined focus of opacification over the lingula which may represent an acute inflammatory or infectious process. No effusion. Emphysema 10/19/18 CTA chest: No acute cardiopulmonary disease and no evidence of pulm embolism. Hospitalization 1/12-1/14/2020: COPD exacerbation Hospitalization 2/22-2/27/20: COPD exacerbation, HCAP 02/11/19 CT chest: Progressed masslike consolidation within the RUL in the region of previous waxing and waning nodule, this is contiguous with the pleural surface and demonstrates spiculated margin and probable small focus of central cavitation. Findings could be secondary to infection superimposed on chronic nodule. However cavitary neoplasm is also a concern. Multiple additional lung nodules which have not significantly changed. Small foci of ground-glass density in the RUL may reflect small focus of infection. Mod-severe emphysema 05/01/19 CT chest: Waxing/waning right lung opacities, suggesting infection, including atypical/mycobacterial infection. Improvement of the prior dominant masslike opacity argues against neoplasm. Stable left upper lobe nodule, likely benign. Aortic Atherosclerosis and Emphysema 11/02/2019 CT chest: Multiple larger pulmonary nodules and  masslike areas seen on the prior study have largely regressed.  Findings indicative of infectious/inflammatory process.  Diffuse bronchial wall thickening with moderate centrilobular and paraseptal emphysema.  Suggestion of left main and three-vessel coronary artery disease. 04/23/2020 CT chest: "Multiple bilateral stable pulmonary nodules. New 7 mm nodule over the posterior left upper lobe. Stable nodularity over an area ofright upper lobe/apical scarring with new 9 mm nodular focus along the most inferior aspect of this scarring. This area has shown to wax and wane on previous exams and is most likely inflammatory". 07/24/2020 CT chest: "Background of marked pulmonary emphysema with stable post infectious or inflammatory changes and with near complete resolution of a presumably infectious or inflammatory nodule in the LEFT upper lobe as compared to previous exam". 01/21/2021 CT chest: Severe emphysematous changes, pulmonary scarring, bilateral calcified granulomas. 01/29/2021 PFTs: Further decline in lung function.  FEV1 0.74 L or 22% predicted, FVC 1.91 L or 42% predicted.  No significant bronchodilator response.  FEV1/FVC 39%.  Patient could not perform lung volumes. Diffusion capacity severely reduced.  This is consistent with very severe COPD. 02/13/2021 2D echo: LVEF 40 to 45% global hypokinesis, severe hypokinesis of the periapical region.  Grade II diastolic dysfunction, dilated left atrium. 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  Biopsies high-grade dysplastic. 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 05/09/2022 PET/CT: Intense tracer uptake associated with the distal esophageal mass. 06/03/2022 chemotherapy-XRT: Patient began concurrent carboplatin/Taxol/XRT. 07/26/2022 completed XRT: Completing chemotherapy. 09/02/2022 PET/CT: Distal esophageal wall thickening with significant  reduction in abnormal FDG activity, small focus of minimally metabolic activity, possibly reflecting posttreatment change.  No evidence of hypermetabolic metastatic disease. 09/09/2022 echocardiogram: LVEF 50 to  55%, grade 1 DD, normal RV function, 10/11/2022 chest x-ray: Hyperinflation of the lungs no acute cardiopulmonary disease.  Granuloma of the LEFT upper lobe. 12/06/2022 PET/CT: No evidence of local recurrence or metastatic disease after treatment for esophageal cancer.  Stable bilateral adrenal adenomas.   Review of Systems A 10 point review of systems was performed and it is as noted above otherwise negative.  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   Anemia due to antineoplastic chemotherapy 06/26/2022   Constipation 06/19/2022   Encounter for antineoplastic chemotherapy 05/31/2022   Weight loss 05/31/2022   Dysphagia 05/31/2022   Primary esophageal squamous cell carcinoma (HCC) 05/04/2022   COPD exacerbation (HCC) 07/31/2021   Prostate cancer (HCC) 10/11/2020   Elevated PSA 07/28/2020   Iron deficiency anemia 05/20/2020   Goals of care, counseling/discussion    Palliative care by specialist    Healthcare-associated pneumonia 01/28/2019   COPD with acute exacerbation (HCC) 12/31/2018   Generalized anxiety disorder 11/12/2018   Moderate episode of recurrent major depressive disorder (HCC) 11/12/2018   Chronic respiratory failure with hypoxia (HCC) 09/29/2018   Acute respiratory failure with hypoxia and hypercapnia (HCC) 09/13/2018   Acute on chronic respiratory failure with hypoxia and hypercapnia (HCC) 08/24/2018   Atypical chest pain 07/02/2018   Grief reaction 01/13/2018   COPD with hypoxia (HCC) 12/25/2017   Unstable angina (HCC)    Benign neoplasm of ascending colon    Benign neoplasm of descending colon    Benign neoplasm of sigmoid colon    History of colonic polyps     Pulmonary nodules/lesions, multiple 01/05/2015   Bruit of left carotid artery 11/04/2014   Sleep disorder 07/18/2013   Seasonal and perennial allergic rhinitis 05/29/2013   Bradycardia 04/20/2011   SMOKER 07/30/2010   Coronary artery disease 04/23/2010   Hyperlipidemia 03/24/2010   DEPRESSION/ANXIETY 03/24/2010   Chronic heart failure with preserved ejection fraction (HFpEF) (HCC) 03/24/2010   COPD, very severe (HCC) 03/24/2010   GERD 03/24/2010   Essential hypertension 11/21/2009   Social History   Tobacco Use   Smoking status: Former    Packs/day: 3.00    Years: 42.00    Additional pack years: 0.00    Total pack years: 126.00    Types: Cigarettes    Quit date: 02/10/2019    Years since quitting: 4.3   Smokeless tobacco: Never   Tobacco comments:    25 months ago most smoked 4 packs a day .  Substance Use Topics   Alcohol use: No    Alcohol/week: 0.0 standard drinks of alcohol   Allergies  Allergen Reactions   Prozac [Fluoxetine Hcl] Shortness Of Breath   Hydroxyzine    Bupropion Other (See Comments)    "Makes me feel funny" Other reaction(s): Other (See Comments) Unknown/"made me feel real funny" "Makes me feel funny" "Makes me feel funny" Other reaction(s): Other (See Comments) Unknown/"made me feel real funny" "Makes me feel funny"   Effexor Xr [Venlafaxine Hcl Er] Other (See Comments)    "Makes me feel funny"   Medications wear reviewed with the patient and are as noted.  Immunization History  Administered Date(s) Administered   Covid-19, Mrna,Vaccine(Spikevax)1yrs and older 03/29/2023   Fluad Quad(high Dose 65+) 11/16/2021, 11/05/2022   Influenza Whole 09/19/2012   Influenza, High Dose Seasonal PF 09/28/2019   Influenza,inj,Quad PF,6+ Mos 10/24/2013, 11/25/2014, 03/02/2016, 10/27/2016, 10/05/2017   Influenza-Unspecified 10/24/2013, 11/25/2014, 03/02/2016, 10/27/2016, 10/05/2017, 09/18/2018,  09/28/2019, 09/30/2020   Moderna Sars-Covid-2 Vaccination  01/26/2020, 02/23/2020   PFIZER Comirnaty(Gray Top)Covid-19 Tri-Sucrose Vaccine 12/15/2020, 07/15/2021   PFIZER(Purple Top)SARS-COV-2 Vaccination 01/26/2020, 02/23/2020   PNEUMOCOCCAL CONJUGATE-20 03/22/2022   Pfizer Covid-19 Vaccine Bivalent Booster 48yrs & up 01/14/2022, 05/25/2022   Pneumococcal Conjugate-13 11/25/2014   Pneumococcal Polysaccharide-23 08/24/2016, 10/27/2016   Pneumococcal-Unspecified 12/20/2008   Tdap 12/20/2008        Objective:   Physical Exam BP 128/78 (BP Location: Left Arm, Cuff Size: Normal)   Pulse (!) 104   Ht 5\' 10"  (1.778 m)   Wt 195 lb 6.4 oz (88.6 kg)   SpO2 97%   BMI 28.04 kg/m   SpO2: 97 % O2 Device: Nasal cannula O2 Flow Rate (L/min): 3 L/min  GENERAL: Well-developed well-nourished gentleman, chronic use of accessories, no conversational dyspnea. No acute distress.  Ambulating with cane. Using oxygen via portable concentrator at 2 L pulsed. HEAD: Normocephalic, atraumatic. EYES: Pupils equal, round, reactive to light.  No scleral icterus. MOUTH: Nose/mouth/throat not examined due to masking requirements for COVID 19. NECK: Supple. No thyromegaly. No nodules. No JVD.  Trachea midline, no crepitus. PULMONARY: Very distant breath sounds, not moving much air, no adventitious sounds. CARDIOVASCULAR: S1 and S2. Regular rate and rhythm.  No rubs murmurs or gallops heard. GASTROINTESTINAL: Protuberant abdomen, soft, benign. MUSCULOSKELETAL: No joint deformity, no clubbing, no edema. NEUROLOGIC: Awake, alert, oriented.  No overt focal deficits.  Speech is fluent. SKIN: Intact,warm,dry.  Limited exam: No rashes. PSYCH: Normal mood and behavior.    Assessment & Plan:     ICD-10-CM   1. Stage 4 very severe COPD by GOLD classification (HCC)  J44.9    Continue DuoNebs Continue Pulmicort via neb    2. Chronic hypoxemic respiratory failure (HCC)  J96.11    Continue oxygen supplementation Patient is compliant with therapy    3. Chronic combined  systolic and diastolic CHF (congestive heart failure) (HCC)  I50.42    This issue adds complexity to his management Follows with cardiology    4. Primary esophageal squamous cell carcinoma (HCC)  C15.9    This issue adds complexity to his management Follows with oncology     No orders of the defined types were placed in this encounter.  Meds ordered this encounter  Medications   ipratropium-albuterol (DUONEB) 0.5-2.5 (3) MG/3ML SOLN    Sig: Take 3 mLs by nebulization every 6 (six) hours as needed.    Dispense:  360 mL    Refill:  11   ipratropium-albuterol (DUONEB) 0.5-2.5 (3) MG/3ML SOLN    Sig: Take 3 mLs by nebulization every 6 (six) hours as needed.    Dispense:  360 mL    Refill:  11    Dx. is COPD J 44.9   We will see the patient in follow-up in 6 to 8 weeks time call sooner should any new problems arise.  Gailen Shelter, MD Advanced Bronchoscopy PCCM  [facility]    *This note was dictated using voice recognition software/Dragon.  Despite best efforts to proofread, errors can occur which can change the meaning. Any transcriptional errors that result from this process are unintentional and may not be fully corrected at the time of dictation.

## 2022-12-09 NOTE — Patient Instructions (Addendum)
Your nebulizer solutions should be as follows: Ipratropium/albuterol (DuoNeb): This is 4 times a day via nebulizer you may have 1 extra dose if short of breath for no more than 5 doses a day.  Note that the prescription says "as needed" but this is because this is the way Medicare will cover it.  But you should use it 4 times a day. Budesonide (Pulmicort): Twice a day use it after one of your DuoNeb doses.  You may use albuterol as needed only.   Take your antibiotic and prednisone that you started today until it is all gone.  We will see you in follow-up in 6 to 8 weeks time call sooner should any new problems arise.

## 2022-12-14 ENCOUNTER — Telehealth: Payer: Self-pay | Admitting: *Deleted

## 2022-12-14 ENCOUNTER — Ambulatory Visit: Payer: Medicare Other | Attending: Cardiology | Admitting: Cardiology

## 2022-12-14 ENCOUNTER — Encounter: Payer: Self-pay | Admitting: Cardiology

## 2022-12-14 VITALS — BP 130/74 | HR 109 | Ht 70.0 in | Wt 196.0 lb

## 2022-12-14 DIAGNOSIS — E782 Mixed hyperlipidemia: Secondary | ICD-10-CM

## 2022-12-14 DIAGNOSIS — I25118 Atherosclerotic heart disease of native coronary artery with other forms of angina pectoris: Secondary | ICD-10-CM | POA: Diagnosis not present

## 2022-12-14 DIAGNOSIS — I5022 Chronic systolic (congestive) heart failure: Secondary | ICD-10-CM | POA: Insufficient documentation

## 2022-12-14 DIAGNOSIS — J449 Chronic obstructive pulmonary disease, unspecified: Secondary | ICD-10-CM | POA: Insufficient documentation

## 2022-12-14 DIAGNOSIS — R000 Tachycardia, unspecified: Secondary | ICD-10-CM | POA: Insufficient documentation

## 2022-12-14 DIAGNOSIS — I10 Essential (primary) hypertension: Secondary | ICD-10-CM | POA: Insufficient documentation

## 2022-12-14 MED ORDER — ISOSORBIDE MONONITRATE ER 30 MG PO TB24: 30.0000 mg | ORAL_TABLET | Freq: Every day | ORAL | 3 refills | Status: DC

## 2022-12-14 MED ORDER — LOSARTAN POTASSIUM 25 MG PO TABS: 25.0000 mg | ORAL_TABLET | Freq: Every day | ORAL | 3 refills | Status: DC

## 2022-12-14 MED ORDER — FUROSEMIDE 20 MG PO TABS: 20.0000 mg | ORAL_TABLET | Freq: Every day | ORAL | 3 refills | Status: DC

## 2022-12-14 NOTE — Telephone Encounter (Signed)
I spoke with pt today concerning add on order for Lipid panel per Gerrie Nordmann, NP after his appointment today. Pt made aware that he will need to go to [facility] for fasting Lipid panel the day of Pulmonary visit or if he would like to check with his oncologist and have them draw the labs. He is aware to have these labs drawn soon as possible before next visit. He was understanding and mentioned that he would have those labs drawn.

## 2022-12-14 NOTE — Patient Instructions (Addendum)
Medication Instructions:   NONE  *If you need a refill on your cardiac medications before your next appointment, please call your pharmacy*   Lab Work:  NONE  If you have labs (blood work) drawn today and your tests are completely normal, you will receive your results only by: [place] (if you have MyChart) OR A paper copy in the mail If you have any lab test that is abnormal or we need to change your treatment, we will call you to review the results.   Testing/Procedures:  NONE   Follow-Up: At [facility], you and your health needs are our priority.  As part of our continuing mission to provide you with exceptional heart care, we have created designated Provider Care Teams.  These Care Teams include your primary Cardiologist (physician) and Advanced Practice Providers (APPs -  Physician Assistants and Nurse Practitioners) who all work together to provide you with the care you need, when you need it.  We recommend signing up for the patient portal called "MyChart".  Sign up information is provided on this After Visit Summary.  MyChart is used to connect with patients for Virtual Visits (Telemedicine).  Patients are able to view lab/test results, encounter notes, upcoming appointments, etc.  Non-urgent messages can be sent to your provider as well.   To learn more about what you can do with MyChart, go to [email or web address].    Your next appointment:   3 month(s)  The format for your next appointment:   In Person  Provider:   You may see Kathlyn Sacramento, MD or one of the following Advanced Practice Providers on your designated Care Team:   Murray Hodgkins, NP Christell Faith, PA-C Cadence Kathlen Mody, PA-C Gerrie Nordmann, NP      Important Information About Sugar

## 2022-12-15 ENCOUNTER — Encounter: Payer: Self-pay | Admitting: Oncology

## 2022-12-15 ENCOUNTER — Inpatient Hospital Stay: Payer: Medicare Other | Attending: Oncology

## 2022-12-15 ENCOUNTER — Inpatient Hospital Stay (HOSPITAL_BASED_OUTPATIENT_CLINIC_OR_DEPARTMENT_OTHER): Payer: Medicare Other | Admitting: Oncology

## 2022-12-15 VITALS — BP 116/74 | HR 98 | Temp 98.7°F | Resp 18 | Wt 197.5 lb

## 2022-12-15 DIAGNOSIS — C159 Malignant neoplasm of esophagus, unspecified: Secondary | ICD-10-CM

## 2022-12-15 DIAGNOSIS — C61 Malignant neoplasm of prostate: Secondary | ICD-10-CM

## 2022-12-15 DIAGNOSIS — I252 Old myocardial infarction: Secondary | ICD-10-CM | POA: Diagnosis not present

## 2022-12-15 DIAGNOSIS — Z8501 Personal history of malignant neoplasm of esophagus: Secondary | ICD-10-CM | POA: Insufficient documentation

## 2022-12-15 DIAGNOSIS — J449 Chronic obstructive pulmonary disease, unspecified: Secondary | ICD-10-CM | POA: Diagnosis not present

## 2022-12-15 DIAGNOSIS — J9610 Chronic respiratory failure, unspecified whether with hypoxia or hypercapnia: Secondary | ICD-10-CM | POA: Insufficient documentation

## 2022-12-15 DIAGNOSIS — Z8546 Personal history of malignant neoplasm of prostate: Secondary | ICD-10-CM | POA: Diagnosis present

## 2022-12-15 LAB — CBC WITH DIFFERENTIAL/PLATELET
Abs Immature Granulocytes: 0.32 10*3/uL — ABNORMAL HIGH (ref 0.00–0.07)
Basophils Absolute: 0 10*3/uL (ref 0.0–0.1)
Basophils Relative: 0 %
Eosinophils Absolute: 0 10*3/uL (ref 0.0–0.5)
Eosinophils Relative: 0 %
HCT: 41.9 % (ref 39.0–52.0)
Hemoglobin: 13.5 g/dL (ref 13.0–17.0)
Immature Granulocytes: 4 %
Lymphocytes Relative: 4 %
Lymphs Abs: 0.4 10*3/uL — ABNORMAL LOW (ref 0.7–4.0)
MCH: 30 pg (ref 26.0–34.0)
MCHC: 32.2 g/dL (ref 30.0–36.0)
MCV: 93.1 fL (ref 80.0–100.0)
Monocytes Absolute: 0.4 10*3/uL (ref 0.1–1.0)
Monocytes Relative: 4 %
Neutro Abs: 8.2 10*3/uL — ABNORMAL HIGH (ref 1.7–7.7)
Neutrophils Relative %: 88 %
Platelets: 190 10*3/uL (ref 150–400)
RBC: 4.5 MIL/uL (ref 4.22–5.81)
RDW: 12.7 % (ref 11.5–15.5)
WBC: 9.3 10*3/uL (ref 4.0–10.5)
nRBC: 0 % (ref 0.0–0.2)

## 2022-12-15 LAB — COMPREHENSIVE METABOLIC PANEL
ALT: 30 U/L (ref 0–44)
AST: 26 U/L (ref 15–41)
Albumin: 3.9 g/dL (ref 3.5–5.0)
Alkaline Phosphatase: 53 U/L (ref 38–126)
Anion gap: 10 (ref 5–15)
BUN: 22 mg/dL (ref 8–23)
CO2: 28 mmol/L (ref 22–32)
Calcium: 8.5 mg/dL — ABNORMAL LOW (ref 8.9–10.3)
Chloride: 102 mmol/L (ref 98–111)
Creatinine, Ser: 1.01 mg/dL (ref 0.61–1.24)
GFR, Estimated: >60 mL/min (ref >60)
Glucose, Bld: 215 mg/dL — ABNORMAL HIGH (ref 70–99)
Potassium: 3.8 mmol/L (ref 3.5–5.1)
Sodium: 140 mmol/L (ref 135–145)
Total Bilirubin: 0.5 mg/dL (ref 0.3–1.2)
Total Protein: 6.4 g/dL — ABNORMAL LOW (ref 6.5–8.1)

## 2022-12-15 NOTE — Progress Notes (Signed)
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 S/p concurrent chemotherapy and radiation [finished 07/26/22] Repeat EGD showed no evidence of malignancy. PET restaging showed resolution of hypermetabolic activity.  Patient has achieved CR Recommend continued surveillance CT scan every 3 months.  Prostate cancer ([place]) Gleason score 3+4, status post definitive radiation. Continue follow-up with urology Dr. Diamantina Providence.        Orders Placed This Encounter  Procedures   CT CHEST ABDOMEN PELVIS W CONTRAST    To be done in 3 months, a few days prior to seeing Dr. Tasia Catchings    Standing Status:   Future    Standing Expiration Date:   12/16/2023    Order Specific Question:   If indicated for the ordered procedure, I authorize the administration of contrast media per Radiology protocol    Answer:   Yes    Order Specific Question:   Does the patient have a contrast media/X-ray dye allergy?    Answer:   Yes    Order Specific Question:   Preferred imaging location?    Answer:   [facility]    Order Specific Question:   Is Oral Contrast requested for this exam?    Answer:   Yes, Per Radiology protocol   CBC with Differential/Platelet    Standing Status:   Future    Standing Expiration Date:   12/16/2023   Comprehensive metabolic panel    Standing Status:   Future    Standing  Expiration Date:   12/15/2023    Follow up  3 months.  Repeat CTscan in 3 months and follow-up after.  The patient understands the plans discussed today and is in agreement with them.  He knows to contact our office if he develops concerns prior to his next appointment.   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 showed partially obstructive likely malignant esophageal tumor was found at GE junction.  Biopsied.  Normal stomach and normal examined duodenum. Biopsy pathology showed fragments of the squamous mucosa with high-grade dysplastic.  Scant gastric mucosa present with reactive changes and no dysplastic or significant intestinal metaplastic.  Fragments of fibrillar purulent ulcer diabetes also present.  04/27/2022, repeat EGD and a biopsy of GE junction partially obstructed ulcerating mass. Repeat biopsy pathology showed superficial and disaggregated fragments of squamous mucosa with at least severe dysplastic/squamous cell carcinoma in situ.  Definite invasion cannot be identified.  In part secondary  to specimen fragmentation and tangential sectioning.    04/27/2022 Imaging   MRI abdomen MRCP with and without contrast showed bilateral benign adrenal adenoma.  Small hepatic hemangioma and small right hepatic parenchymal vascular anomalies. Small hiatal hernia.  Colonic diverticulosis.   05/04/2022 Initial Diagnosis   Primary esophageal squamous cell carcinoma  + Stomach discomfort for 4 months, dysphagia  for both solids and liquids at times. + Weight loss   05/04/2022 Cancer Staging   Staging form: Esophagus - Squamous Cell Carcinoma, AJCC 8th Edition - Clinical: Stage II (cT3, cN0, cM0, L: Lower) - Signed by [name], MD on 05/31/2022 Stage prefix: Initial diagnosis Total positive nodes: 0   05/09/2022 Imaging   PET scan showed intense tracer uptake associated with the distal esophageal mass just above the level of the GE junction compatible with primary esophageal neoplasm. No signs of tracer avid nodal metastasis or solid organ metastasis.   05/20/2022 Procedure   EUS by Dr.Spaete.  T3,Nx 05/20/2022, esophagus mass biopsy showed invasive moderately differentiated squamous cell carcinoma, with basaloid features and minimal keratinization.  Background squamous cell carcinoma in situ.   06/03/2022 -  Chemotherapy   Patient is on Treatment Plan :  ESOPHAGUS Carboplatin/PACLitaxel weekly x 6 weeks with XRT       07/09/2022 - 07/12/2022 Hospital Admission   Hospitalization due to COPD exacerbation.     09/03/2022 Imaging   PET restaging  1. Similar symmetric distal esophageal wall thickening with significant reduction in the abnormal FDG avidity, now with a small focus of minimally metabolic activity, nonspecific and possibly reflecting posttreatment change or residual tumor. Suggest continued attention on follow-up imaging. 2. No evidence of hypermetabolic metastatic disease in the chest, abdomen or pelvis. 3. Aortic Atherosclerosis (ICD10-I70.0) and Emphysema (ICD10-J43.9).   11/08/2022 Procedure   Repeat EGD by [name] showed  -Non-severe radiation esophagitis with no bleeding. Biopsied.-Pathology showed mild acute esophagitis.  No evidence of Barrett mucosa or dysplasia, no evidence of atypia or malignancy. - Small hiatal hernia. - Normal stomach. - Normal examined duodenum       Patient has multiple comorbidities including COPD/chronic respiratory failure, history of  STEMI/combined systolic and diastolic CHF [last LVEF 40 to 45%], lung nodules previously avid on PET scan, biopsy negative and was felt to be calcified granulomas.-Pulmonology Dr. Patsey Berthold Patient lives by himself.  Independent with ADL and some of the iADL  Interval History Leslie Sims is here today for repeat clinical assessment. He denies fevers or chills. He denies pain. His appetite is fair His weight is stable. + Esophageal dysphagia, improved.  He has no difficulty with pured food and liquids Weight is stable. + COPD exacerbation - urgent care visit.     Review of Systems  Constitutional:  Negative for appetite change, chills, fatigue, fever and unexpected weight change.  HENT:   Negative for hearing loss and voice change.   Eyes:  Negative for eye problems and icterus.  Respiratory:  Positive for shortness of breath. Negative for chest tightness and cough.        Chronic respiratory failure on oxygen  Cardiovascular:  Negative for leg swelling.  Gastrointestinal:  Negative for abdominal distention, abdominal pain and constipation.       Dysphagia  Endocrine: Negative for hot flashes.  Genitourinary:  Negative for difficulty urinating, dysuria and frequency.   Musculoskeletal:  Negative for arthralgias.  Skin:  Negative for itching and rash.  Neurological:  Negative for light-headedness and numbness.  Hematological:  Negative for adenopathy. Does  not bruise/bleed easily.  Psychiatric/Behavioral:  Negative for confusion.      VITALS:  Blood pressure 116/74, pulse 98, temperature 98.7 F (37.1 C), resp. rate 18, weight 197 lb 8 oz (89.6 kg), SpO2 98 %, peak flow (!) 3 L/min.  Wt Readings from Last 3 Encounters:  12/15/22 197 lb 8 oz (89.6 kg)  12/14/22 196 lb (88.9 kg)  12/09/22 195 lb 6.4 oz (88.6 kg)    Body mass index is 28.34 kg/m.  Performance status (ECOG): 2 - Symptomatic, <50% confined to bed  PHYSICAL EXAM:  Physical Exam Constitutional:      General: He is not  in acute distress.    Appearance: He is obese. He is not diaphoretic.  HENT:     Head: Normocephalic and atraumatic.     Nose: Nose normal.     Mouth/Throat:     Pharynx: No oropharyngeal exudate.  Eyes:     General: No scleral icterus.    Pupils: Pupils are equal, round, and reactive to light.  Cardiovascular:     Rate and Rhythm: Normal rate.     Heart sounds: No murmur heard. Pulmonary:     Effort: Pulmonary effort is normal. No respiratory distress.     Comments: Patient is on nasal cannula oxygen Abdominal:     General: There is no distension.     Palpations: Abdomen is soft.  Musculoskeletal:        General: Normal range of motion.     Cervical back: Normal range of motion.  Skin:    Findings: No erythema.  Neurological:     Mental Status: He is alert and oriented to person, place, and time.     Motor: No abnormal muscle tone.  Psychiatric:        Mood and Affect: Mood and affect normal.      LABS:      Latest Ref Rng & Units 12/15/2022    1:44 PM 12/06/2022   10:08 PM 11/14/2022    4:26 AM  CBC  WBC 4.0 - 10.5 K/uL 9.3  7.1  6.8   Hemoglobin 13.0 - 17.0 g/dL 13.5  13.2  14.2   Hematocrit 39.0 - 52.0 % 41.9  41.2  43.8   Platelets 150 - 400 K/uL 190  216  198       Latest Ref Rng & Units 12/15/2022    1:44 PM 12/06/2022   10:08 PM 11/14/2022    4:26 AM  CMP  Glucose 70 - 99 mg/dL 215  158  163   BUN 8 - 23 mg/dL '22  22  13   '$ Creatinine 0.61 - 1.24 mg/dL 1.01  0.90  0.91   Sodium 135 - 145 mmol/L 140  140  143   Potassium 3.5 - 5.1 mmol/L 3.8  3.9  3.7   Chloride 98 - 111 mmol/L 102  103  104   CO2 22 - 32 mmol/L '28  28  31   '$ Calcium 8.9 - 10.3 mg/dL 8.5  9.4  9.6   Total Protein 6.5 - 8.1 g/dL 6.4  6.8    Total Bilirubin 0.3 - 1.2 mg/dL 0.5  0.6    Alkaline Phos 38 - 126 U/L 53  55    AST 15 - 41 U/L 26  21    ALT 0 - 44 U/L 30  27       STUDIES:  NM PET Image Restage (PS) Skull Base to Thigh (F-18 FDG)  Result Date: 12/08/2022  CLINICAL  DATA:  Subsequent treatment strategy for squamous cell carcinoma of the esophagus. History of prostate cancer. EXAM: NUCLEAR MEDICINE PET SKULL BASE TO THIGH TECHNIQUE: 10.4 mCi F-18 FDG was injected intravenously. Full-ring PET imaging was performed from the skull base to thigh after the radiotracer. CT data was obtained and used for attenuation correction and anatomic localization. Fasting blood glucose: 109 mg/dl COMPARISON:  PET-CT 09/02/2022 FINDINGS: Mediastinal blood pool activity: SUV max 1.3 NECK: No hypermetabolic cervical lymph nodes are identified.Activity anteriorly in the oral cavity is likely related to the tongue or dentition.No suspicious activity identified within the pharyngeal mucosal space. Incidental CT findings: Mild bilateral carotid atherosclerosis. CHEST: There are no hypermetabolic mediastinal, hilar or axillary lymph nodes. No hypermetabolic pulmonary activity or suspicious nodularity. No residual abnormal esophageal activity. Incidental CT findings: Atherosclerosis of the aorta, great vessels and coronary arteries. Stable wall thickening of the distal esophagus without recurrent focal mass lesion. Stable emphysema with scattered pulmonary scarring and calcified granulomas. There is a calcified lymph node in the right axilla. ABDOMEN/PELVIS: There is no hypermetabolic activity within the liver, adrenal glands, spleen or pancreas. There is no hypermetabolic nodal activity in the abdomen or pelvis. Stable mildly prominent activity at the anus, within physiologic limits. No abnormal activity within the prostate gland. Incidental CT findings: Small calcified splenic granulomas. Mild aortic and branch vessel atherosclerosis and mild distal colonic diverticulosis. Previously characterized bilateral adrenal adenomas are unchanged; no follow-up imaging recommended. Penile prosthesis and fiducial markers posterior to the prostate gland are unchanged. SKELETON: There is no hypermetabolic activity  to suggest osseous metastatic disease. Incidental CT findings: Mild scoliosis and spondylosis. IMPRESSION: 1. Interval resolution of residual hypermetabolic activity within the distal esophagus consistent with response to therapy. 2. No evidence of local recurrence or metastatic disease. 3. Stable bilateral adrenal adenomas. 4.  Aortic Atherosclerosis (ICD10-I70.0). Electronically Signed   By: Richardean Sale M.D.   On: 12/08/2022 11:28   DG Chest 2 View  Result Date: 12/06/2022 CLINICAL DATA:  Shortness of breath EXAM: CHEST - 2 VIEW COMPARISON:  11/13/2022, PET CT 09/02/2022 FINDINGS: No acute airspace disease. Stable left apical nodule corresponding to partially calcified nodule on CT. Normal cardiomediastinal silhouette. Hiatal hernia. Aortic atherosclerosis. No pneumothorax IMPRESSION: No active cardiopulmonary disease. Electronically Signed   By: Donavan Foil M.D.   On: 12/06/2022 17:16   DG Chest 2 View  Result Date: 11/13/2022 CLINICAL DATA:  Shortness of breath EXAM: CHEST - 2 VIEW COMPARISON:  10/27/2022 radiograph, 09/02/2022 PET CT and prior studies FINDINGS: The cardiomediastinal silhouette is unremarkable. There is no evidence of focal airspace disease, pulmonary edema, suspicious pulmonary nodule/mass, pleural effusion, or pneumothorax. No interval change from the prior study. No acute bony abnormalities are identified. IMPRESSION: No acute cardiopulmonary disease. Electronically Signed   By: Margarette Canada M.D.   On: 11/13/2022 14:41   DG Chest Portable 1 View  Result Date: 10/27/2022 CLINICAL DATA:  Wheezing and shortness of breath. EXAM: PORTABLE CHEST 1 VIEW COMPARISON:  10/11/2022 FINDINGS: The heart size and mediastinal contours are within normal limits. Stable emphysematous lung disease without airspace consolidation, edema, pneumothorax or pleural fluid. Stable calcified granuloma in the left upper lobe. The visualized skeletal structures are unremarkable. IMPRESSION: Stable  emphysematous lung disease. No acute findings. Electronically Signed   By: Aletta Edouard M.D.   On: 10/27/2022 12:14   DG Chest 2 View  Result Date: 10/11/2022 CLINICAL DATA:  A 71 year old male presents with shortness of breath. EXAM: CHEST - 2  VIEW COMPARISON:  July 21, 2022. FINDINGS: Lungs are hyperinflated. No effusion. No consolidation. No pneumothorax. Granuloma in the LEFT upper lobe. On limited assessment no acute skeletal process. IMPRESSION: 1. Hyperinflated lungs without acute cardiopulmonary disease. 2. Granuloma in the LEFT upper lobe. Electronically Signed   By: Zetta Bills M.D.   On: 10/11/2022 12:41      Past Medical History:  Diagnosis Date   Allergic rhinitis    Anxiety    Back pain    Cancer (HCC)    prostate   CHF (congestive heart failure) ([place])    Colon polyps 08/2012   colonoscopy; multiple colon polyps; repeat colonoscopy in one year.   Complication of anesthesia    COPD (chronic obstructive pulmonary disease) ([place])    a. on home O2.   Coronary artery disease 11/2009   a. late presenting ant MI; b. LHC 100% pLAD s/p PCI/DES, 99% mRCA s/p PCI/DES, EF 35%; c. nuclear stress test 05/13: prior ant/inf infarcts w/o ischemia, EF 43%; d. LHC 02/14: widely patent stents with no other obs dz, EF 40%; e. LHC 11/17: LM nl, mLAD 10%, patent LAD stent, dLAD 20%, p-mRCA 10%, mRCA 40%, patent RCA stent, EF 35-45%    Depression    Diabetes ([place])    Dysphagia    Family history of adverse reaction to anesthesia    GERD (gastroesophageal reflux disease)    [id number](829.9)    Helicobacter pylori (H. pylori)    Hemorrhoids    Hernia    inguinal   HFrEF (heart failure with reduced ejection fraction) ([place])    a. 10/2016 LV gram: EF 35-45%; b. 06/2018 Echo: EF 40-45%. c. 01/2021 Echo: EF 40-45%   Hyperlipidemia    Hypertension    Insomnia    Ischemic cardiomyopathy    a. 10/2016 LV gram: EF 35-45%; b. 06/2018 Echo: EF 40-45%.   Myalgia    Oxygen dependent    Peptic  ulcer    Pulmonary nodules    ST elevation (STEMI) myocardial infarction involving left anterior descending coronary artery ([place]) 11/2009   a. s/p PCI to the LAD   Status post dilation of esophageal narrowing 2000   Thickening of esophagus    Tinea pedis    Wears dentures    partial upper    Past Surgical History:  Procedure Laterality Date    coronary stents     Admission  12/20/2012   COPD exacerbation.  [place].   CARDIAC CATHETERIZATION     CARDIAC CATHETERIZATION  02/05/2013   [facility]   CARDIAC CATHETERIZATION  09/19/2013   [facility] : patent stents with no change in anatomy. EF: 40$   CARDIAC CATHETERIZATION Left 11/12/2016   Procedure: Left Heart Cath and Coronary Angiography;  Surgeon: Wellington Hampshire, MD;  Location: [place] [facility];  Service: Cardiovascular;  Laterality: Left;   COLONOSCOPY     COLONOSCOPY WITH PROPOFOL N/A 05/18/2016   Procedure: COLONOSCOPY WITH PROPOFOL;  Surgeon: Lucilla Lame, MD;  Location: [facility] ENDOSCOPY;  Service: Endoscopy;  Laterality: N/A;   COLONOSCOPY WITH PROPOFOL N/A 03/19/2020   Procedure: COLONOSCOPY WITH PROPOFOL;  Surgeon: Lin Landsman, MD;  Location[facility] ENDOSCOPY;  Service: Gastroenterology;  Laterality: N/A;   CORONARY ANGIOPLASTY WITH STENT PLACEMENT  09/19/2009   LAD 3.0 X23 mm Xience DES, RCA: 4.0 X 15 mm Xience DES   ELECTROMAGNETIC NAVIGATION BROCHOSCOPY N/A 10/27/2017   Procedure: ELECTROMAGNETIC NAVIGATION BRONCHOSCOPY;  Surgeon: Flora Lipps, MD;  Location: [facility] ORS;  Service: Cardiopulmonary;  Laterality: N/A;  ESOPHAGEAL DILATION     ESOPHAGOGASTRODUODENOSCOPY  12/20/2006   ESOPHAGOGASTRODUODENOSCOPY  08/20/2012   ESOPHAGOGASTRODUODENOSCOPY (EGD) WITH PROPOFOL N/A 04/19/2022   Procedure: ESOPHAGOGASTRODUODENOSCOPY (EGD) WITH PROPOFOL;  Surgeon: [name], MD;  Location: [facility] ENDOSCOPY;  Service: Endoscopy;  Laterality: N/A;   ESOPHAGOGASTRODUODENOSCOPY (EGD) WITH PROPOFOL N/A 04/27/2022   Procedure:  ESOPHAGOGASTRODUODENOSCOPY (EGD) WITH PROPOFOL;  Surgeon: [name], MD;  Location: [facility] ENDOSCOPY;  Service: Endoscopy;  Laterality: N/A;   ESOPHAGOGASTRODUODENOSCOPY (EGD) WITH PROPOFOL N/A 11/08/2022   Procedure: ESOPHAGOGASTRODUODENOSCOPY (EGD) WITH PROPOFOL;  Surgeon: [name], MD;  Location: [facility] ENDOSCOPY;  Service: Endoscopy;  Laterality: N/A;   EUS N/A 05/20/2022   Procedure: UPPER ENDOSCOPIC ULTRASOUND (EUS) LINEAR;  Surgeon: Reita Cliche, MD;  Location: [facility] ENDOSCOPY;  Service: Gastroenterology;  Laterality: N/A;  [facility]   HERNIA REPAIR  07/20/2012   L inguinal hernia repair   PENILE PROSTHESIS IMPLANT      Family History  Problem Relation Age of Onset   Heart attack Brother        Brother #1   Diabetes Brother    Hypertension Brother        #3   Coronary artery disease Father 76       deceased   Heart attack Father    Diabetes Father    Heart disease Father    COPD Mother 56       deceased   Alcohol abuse Sister        polysubstance abuse   COPD Sister    Lung cancer Sister    Alcohol abuse Sister        polysubstance abuse   Penile cancer Brother    Diabetes Brother    Prostate cancer Neg Hx    Bladder Cancer Neg Hx    Kidney cancer Neg Hx     Social History:  reports that he quit smoking about 3 years ago. His smoking use included cigarettes. He has a 126.00 pack-year smoking history. He has never used smokeless tobacco. He reports that he does not drink alcohol and does not use drugs.  Allergies:  Allergies  Allergen Reactions   Prozac [Fluoxetine Hcl] Shortness Of Breath   Hydroxyzine    Bupropion Other (See Comments)    "Makes me feel funny" Other reaction(s): Other (See Comments) Unknown/"made me feel real funny" "Makes me feel funny" "Makes me feel funny" Other reaction(s): Other (See Comments) Unknown/"made me feel real funny" "Makes me feel funny"   Effexor Xr [Venlafaxine Hcl Er] Other (See Comments)    "Makes me feel  funny"    Current Medications: Current Outpatient Medications  Medication Sig Dispense Refill   albuterol (VENTOLIN HFA) 108 (90 Base) MCG/ACT inhaler TAKE 2 PUFFS BY MOUTH EVERY 6 HOURS AS NEEDED FOR WHEEZE OR SHORTNESS OF BREATH 18 each 4   aspirin 81 MG tablet Take 81 mg by mouth daily.     atorvastatin (LIPITOR) 80 MG tablet Take 1 tablet (80 mg total) by mouth daily. 90 tablet 3   budesonide (PULMICORT) 0.5 MG/2ML nebulizer solution Take 2 mLs (0.5 mg total) by nebulization in the morning and at bedtime. 320 mL 12   docusate sodium (COLACE) 100 MG capsule Take 1 capsule (100 mg total) by mouth 2 (two) times daily. 60 capsule 0   esomeprazole (NEXIUM) 40 MG capsule TAKE ONE CAPSULE BY MOUTH ONCE DAILY 30 capsule 4   fluticasone (FLONASE) 50 MCG/ACT nasal spray Place 2 sprays into both nostrils daily.  furosemide (LASIX) 20 MG tablet Take 1 tablet (20 mg total) by mouth daily. 90 tablet 3   ipratropium-albuterol (DUONEB) 0.5-2.5 (3) MG/3ML SOLN Take 3 mLs by nebulization 2 (two) times daily.     ipratropium-albuterol (DUONEB) 0.5-2.5 (3) MG/3ML SOLN Take 3 mLs by nebulization every 6 (six) hours as needed. 360 mL 11   isosorbide mononitrate (IMDUR) 30 MG 24 hr tablet Take 1 tablet (30 mg total) by mouth daily. 90 tablet 3   ketoconazole (NIZORAL) 2 % cream Apply topically 2 (two) times daily.     loratadine (CLARITIN) 10 MG tablet Take 10 mg by mouth daily.     LORazepam (ATIVAN) 1 MG tablet Take 1 tablet (1 mg total) by mouth 3 (three) times daily as needed for anxiety.     losartan (COZAAR) 25 MG tablet Take 1 tablet (25 mg total) by mouth daily. 90 tablet 3   mirtazapine (REMERON) 45 MG tablet Take 45 mg by mouth at bedtime.     Multiple Vitamin (MULTIVITAMIN) capsule Take 1 capsule by mouth daily.     OXYGEN Inhale 2.5 L into the lungs daily.     polyethylene glycol (MIRALAX) 17 g packet Take 17 g by mouth daily as needed.     predniSONE (DELTASONE) 10 MG tablet Take 6 tablets (60  mg total) by mouth daily for 3 days, THEN 4 tablets (40 mg total) daily for 3 days, THEN 2 tablets (20 mg total) daily for 3 days, THEN 1 tablet (10 mg total) daily for 3 days. 39 tablet 0   Spacer/Aero-Holding Chambers (AEROCHAMBER MV) inhaler Use as instructed 1 each 2   sucralfate (CARAFATE) 1 g tablet Take 1 g by mouth 2 (two) times daily with a meal.     nitroGLYCERIN (NITROSTAT) 0.4 MG SL tablet Place 1 tablet (0.4 mg total) under the tongue every 5 (five) minutes as needed. (Patient not taking: Reported on 12/15/2022) 25 tablet 3   No current facility-administered medications for this visit.

## 2022-12-15 NOTE — Assessment & Plan Note (Addendum)
He is on concurrent chemotherapy carboplatin/Taxol weekly with radiation. S/p concurrent chemotherapy and radiation [finished 07/26/22] Repeat EGD showed no evidence of malignancy. PET restaging showed resolution of hypermetabolic activity.  Patient has achieved CR Recommend continued surveillance CT scan every 3 months.

## 2022-12-15 NOTE — Assessment & Plan Note (Signed)
Gleason score 3+4, status post definitive radiation. Continue follow-up with urology Dr. Diamantina Providence.

## 2022-12-21 ENCOUNTER — Other Ambulatory Visit: Payer: Self-pay

## 2022-12-21 ENCOUNTER — Emergency Department: Payer: Medicare Other

## 2022-12-21 ENCOUNTER — Encounter: Payer: Self-pay | Admitting: Emergency Medicine

## 2022-12-21 ENCOUNTER — Emergency Department
Admission: EM | Admit: 2022-12-21 | Discharge: 2022-12-21 | Disposition: A | Discharge status: Home / Self Care | Payer: Medicare Other | Attending: Emergency Medicine | Admitting: Emergency Medicine

## 2022-12-21 DIAGNOSIS — Z20822 Contact with and (suspected) exposure to covid-19: Secondary | ICD-10-CM | POA: Insufficient documentation

## 2022-12-21 DIAGNOSIS — J441 Chronic obstructive pulmonary disease with (acute) exacerbation: Secondary | ICD-10-CM | POA: Diagnosis not present

## 2022-12-21 DIAGNOSIS — R0602 Shortness of breath: Secondary | ICD-10-CM | POA: Diagnosis present

## 2022-12-21 LAB — RESP PANEL BY RT-PCR (RSV, FLU A&B, COVID)  RVPGX2
Influenza A by PCR: NEGATIVE
Influenza B by PCR: NEGATIVE
Resp Syncytial Virus by PCR: NEGATIVE
SARS Coronavirus 2 by RT PCR: NEGATIVE

## 2022-12-21 LAB — CBC
HCT: 44.5 % (ref 39.0–52.0)
Hemoglobin: 14.3 g/dL (ref 13.0–17.0)
MCH: 30 pg (ref 26.0–34.0)
MCHC: 32.1 g/dL (ref 30.0–36.0)
MCV: 93.3 fL (ref 80.0–100.0)
Platelets: 179 10*3/uL (ref 150–400)
RBC: 4.77 MIL/uL (ref 4.22–5.81)
RDW: 12.8 % (ref 11.5–15.5)
WBC: 7 10*3/uL (ref 4.0–10.5)
nRBC: 0 % (ref 0.0–0.2)

## 2022-12-21 LAB — BASIC METABOLIC PANEL
Anion gap: 9 (ref 5–15)
BUN: 19 mg/dL (ref 8–23)
CO2: 30 mmol/L (ref 22–32)
Calcium: 9.3 mg/dL (ref 8.9–10.3)
Chloride: 100 mmol/L (ref 98–111)
Creatinine, Ser: 0.93 mg/dL (ref 0.61–1.24)
GFR, Estimated: >60 mL/min (ref >60)
Glucose, Bld: 118 mg/dL — ABNORMAL HIGH (ref 70–99)
Potassium: 3.7 mmol/L (ref 3.5–5.1)
Sodium: 139 mmol/L (ref 135–145)

## 2022-12-21 LAB — TROPONIN I (HIGH SENSITIVITY)
Troponin I (High Sensitivity): 14 ng/L (ref <18)
Troponin I (High Sensitivity): 15 ng/L (ref <18)

## 2022-12-21 MED ORDER — IPRATROPIUM-ALBUTEROL 0.5-2.5 (3) MG/3ML IN SOLN
3.0000 mL | Freq: Once | RESPIRATORY_TRACT | Status: AC
  Administered 2022-12-21: 3 mL via RESPIRATORY_TRACT
  Filled 2022-12-21: qty 3

## 2022-12-21 MED ORDER — PREDNISONE 10 MG (21) PO TBPK: ORAL_TABLET | ORAL | 0 refills | Status: DC

## 2022-12-21 MED ORDER — METHYLPREDNISOLONE SODIUM SUCC 125 MG IJ SOLR
125.0000 mg | Freq: Once | INTRAMUSCULAR | Status: AC
  Administered 2022-12-21: 125 mg via INTRAVENOUS
  Filled 2022-12-21: qty 2

## 2022-12-21 MED ORDER — AZITHROMYCIN 250 MG PO TABS: ORAL_TABLET | ORAL | 0 refills | Status: AC

## 2022-12-21 NOTE — Discharge Instructions (Signed)
Please seek medical attention for any high fevers, chest pain, shortness of breath, change in behavior, persistent vomiting, bloody stool or any other new or concerning symptoms.  

## 2022-12-21 NOTE — ED Triage Notes (Signed)
PT to ED via ems with reports of SOB. Pt has hx of copd, seen pulmonary last week and was placed on nebs q4h that are not working. Last neb 0830. Pt chronically on 3L [place] and was 98% with EMS.   88HR 142/76 160CBG

## 2022-12-21 NOTE — ED Triage Notes (Signed)
Pt via EMS from home. Pt c/o SOB for the past week, states there was recent change in his neb/inhaler and since then he has been having increased SOB. Pt wears 3L [place] with a hx of COPD. States that he also has been having chest discomfort since the change in his medication. Pt is A&Ox4 and NAD

## 2022-12-21 NOTE — ED Provider Notes (Signed)
[facility] Provider Note    Event Date/Time   First MD Initiated Contact with Patient 12/21/22 1121     (approximate)   History   Shortness of Breath   HPI  Leslie Sims is a 72 y.o. male who presents to the emergency department today because of concerns for shortness of breath.  Patient has a history of COPD is on 2 L of oxygen at baseline.  His doctor recently changed his medications.  He thinks that this might have contributed why he is little bit more short of breath this past week.  Patient has some associated chest tightness.  He denies any fevers or chills.  No productive cough.     Physical Exam   Triage Vital Signs: ED Triage Vitals  Enc Vitals Group     BP 12/21/22 1033 123/77     Pulse Rate 12/21/22 1033 89     Resp 12/21/22 1033 20     Temp 12/21/22 1033 98.1 F (36.7 C)     Temp Source 12/21/22 1033 Oral     SpO2 12/21/22 1033 96 %     Weight 12/21/22 1032 194 lb (88 kg)     Height 12/21/22 1032 '5\' 10"'$  (1.778 m)     Head Circumference --      Peak Flow --      Pain Score 12/21/22 1031 3     Pain Loc --      Pain Edu? --      Excl. in [place]? --     Most recent vital signs: Vitals:   12/21/22 1033  BP: 123/77  Pulse: 89  Resp: 20  Temp: 98.1 F (36.7 C)  SpO2: 96%   General: Awake, alert, oriented. CV:  Good peripheral perfusion. Regular rate and rhythm. Resp:  Slightly increased respiratory effort. Diffuse expiratory wheezing. Abd:  No distention.    ED Results / Procedures / Treatments   Labs (all labs ordered are listed, but only abnormal results are displayed) Labs Reviewed  BASIC METABOLIC PANEL - Abnormal; Notable for the following components:      Result Value   Glucose, Bld 118 (*)    All other components within normal limits  RESP PANEL BY RT-PCR (RSV, FLU A&B, COVID)  RVPGX2  CBC  TROPONIN I (HIGH SENSITIVITY)     EKG  I, Nance Pear, attending physician, personally viewed and interpreted  this EKG  EKG Time: 1036 Rate: 86 Rhythm: normal sinus rhythm Axis: left axis deviation Intervals: qtc 476 QRS: narrow ST changes: no st elevation Impression: abnormal ekg   RADIOLOGY I independently interpreted and visualized the CXR. My interpretation: No pneumonia Radiology interpretation:  IMPRESSION:  1. No acute lung process.  2. Chronic emphysematous changes.  3. Stable benign calcified granuloma in the superolateral left lung.     PROCEDURES:  Critical Care performed: No  Procedures   MEDICATIONS ORDERED IN ED: Medications - No data to display   IMPRESSION / MDM / [place] / ED COURSE  I reviewed the triage vital signs and the nursing notes.                              Differential diagnosis includes, but is not limited to, pneumonia, covid, influenza, RSV, COPD.  Patient's presentation is most consistent with acute presentation with potential threat to life or bodily function.  Patient presented to the urgency department today because of  concerns for shortness of breath.  On exam patient does have diffuse expiratory wheezing.  Chest x-ray without pneumonia.  Patient was given breathing treatments and did feel better.  Additionally patient was given steroids here.  He did feel comfortable with discharge home which I think is reasonable at this time.  Will give patient prescription for steroids. Discussed return precautions.    FINAL CLINICAL IMPRESSION(S) / ED DIAGNOSES   Final diagnoses:  COPD exacerbation ([place])   Note:  This document was prepared using Dragon voice recognition software and may include unintentional dictation errors.    Nance Pear, MD 12/21/22 [phone]

## 2022-12-23 ENCOUNTER — Other Ambulatory Visit (HOSPITAL_COMMUNITY): Payer: Self-pay

## 2023-01-08 ENCOUNTER — Emergency Department: Payer: Medicare Other

## 2023-01-08 ENCOUNTER — Other Ambulatory Visit: Payer: Self-pay

## 2023-01-08 ENCOUNTER — Emergency Department
Admission: EM | Admit: 2023-01-08 | Discharge: 2023-01-08 | Disposition: A | Discharge status: Home / Self Care | Payer: Medicare Other | Attending: Emergency Medicine | Admitting: Emergency Medicine

## 2023-01-08 DIAGNOSIS — Z1152 Encounter for screening for COVID-19: Secondary | ICD-10-CM | POA: Diagnosis not present

## 2023-01-08 DIAGNOSIS — R0789 Other chest pain: Secondary | ICD-10-CM | POA: Insufficient documentation

## 2023-01-08 DIAGNOSIS — J449 Chronic obstructive pulmonary disease, unspecified: Secondary | ICD-10-CM | POA: Insufficient documentation

## 2023-01-08 LAB — CBC WITH DIFFERENTIAL/PLATELET
Abs Immature Granulocytes: 0.1 10*3/uL — ABNORMAL HIGH (ref 0.00–0.07)
Basophils Absolute: 0 10*3/uL (ref 0.0–0.1)
Basophils Relative: 1 %
Eosinophils Absolute: 0.1 10*3/uL (ref 0.0–0.5)
Eosinophils Relative: 1 %
HCT: 42.5 % (ref 39.0–52.0)
Hemoglobin: 13.5 g/dL (ref 13.0–17.0)
Immature Granulocytes: 2 %
Lymphocytes Relative: 16 %
Lymphs Abs: 0.8 10*3/uL (ref 0.7–4.0)
MCH: 29.3 pg (ref 26.0–34.0)
MCHC: 31.8 g/dL (ref 30.0–36.0)
MCV: 92.4 fL (ref 80.0–100.0)
Monocytes Absolute: 0.7 10*3/uL (ref 0.1–1.0)
Monocytes Relative: 14 %
Neutro Abs: 3.3 10*3/uL (ref 1.7–7.7)
Neutrophils Relative %: 66 %
Platelets: 193 10*3/uL (ref 150–400)
RBC: 4.6 MIL/uL (ref 4.22–5.81)
RDW: 13 % (ref 11.5–15.5)
WBC: 5 10*3/uL (ref 4.0–10.5)
nRBC: 0 % (ref 0.0–0.2)

## 2023-01-08 LAB — URINALYSIS, ROUTINE W REFLEX MICROSCOPIC
Bilirubin Urine: NEGATIVE
Glucose, UA: NEGATIVE mg/dL
Hgb urine dipstick: NEGATIVE
Ketones, ur: NEGATIVE mg/dL
Leukocytes,Ua: NEGATIVE
Nitrite: NEGATIVE
Protein, ur: NEGATIVE mg/dL
Specific Gravity, Urine: 1.005 (ref 1.005–1.030)
pH: 7 (ref 5.0–8.0)

## 2023-01-08 LAB — COMPREHENSIVE METABOLIC PANEL
ALT: 31 U/L (ref 0–44)
AST: 24 U/L (ref 15–41)
Albumin: 3.6 g/dL (ref 3.5–5.0)
Alkaline Phosphatase: 51 U/L (ref 38–126)
Anion gap: 9 (ref 5–15)
BUN: 13 mg/dL (ref 8–23)
CO2: 29 mmol/L (ref 22–32)
Calcium: 8.7 mg/dL — ABNORMAL LOW (ref 8.9–10.3)
Chloride: 99 mmol/L (ref 98–111)
Creatinine, Ser: 0.95 mg/dL (ref 0.61–1.24)
GFR, Estimated: >60 mL/min (ref >60)
Glucose, Bld: 110 mg/dL — ABNORMAL HIGH (ref 70–99)
Potassium: 3.7 mmol/L (ref 3.5–5.1)
Sodium: 137 mmol/L (ref 135–145)
Total Bilirubin: 1.1 mg/dL (ref 0.3–1.2)
Total Protein: 6.2 g/dL — ABNORMAL LOW (ref 6.5–8.1)

## 2023-01-08 LAB — RESP PANEL BY RT-PCR (RSV, FLU A&B, COVID)  RVPGX2
Influenza A by PCR: NEGATIVE
Influenza B by PCR: NEGATIVE
Resp Syncytial Virus by PCR: NEGATIVE
SARS Coronavirus 2 by RT PCR: NEGATIVE

## 2023-01-08 LAB — TROPONIN I (HIGH SENSITIVITY)
Troponin I (High Sensitivity): 13 ng/L (ref <18)
Troponin I (High Sensitivity): 11 ng/L (ref <18)

## 2023-01-08 LAB — BRAIN NATRIURETIC PEPTIDE: B Natriuretic Peptide: 33.3 pg/mL (ref 0.0–100.0)

## 2023-01-08 MED ORDER — IPRATROPIUM-ALBUTEROL 0.5-2.5 (3) MG/3ML IN SOLN
6.0000 mL | Freq: Once | RESPIRATORY_TRACT | Status: AC
  Administered 2023-01-08: 6 mL via RESPIRATORY_TRACT
  Filled 2023-01-08: qty 3

## 2023-01-08 MED ORDER — ACETAMINOPHEN 325 MG PO TABS
650.0000 mg | ORAL_TABLET | Freq: Once | ORAL | Status: AC
  Administered 2023-01-08: 650 mg via ORAL
  Filled 2023-01-08: qty 2

## 2023-01-08 MED ORDER — DOXYCYCLINE HYCLATE 50 MG PO CAPS: 100.0000 mg | ORAL_CAPSULE | Freq: Two times a day (BID) | ORAL | 0 refills | Status: AC

## 2023-01-08 NOTE — ED Triage Notes (Signed)
Pt to ED via EMS from home--Complaining of chest pain starting this morning around 0600. Pt has a hx of COPD with frequent aspiration and exacerbations. Pt is on 2.5 L at baseline and was given a nebulizer treatment via EMS.

## 2023-01-08 NOTE — ED Provider Notes (Signed)
[facility] Provider Note   Event Date/Time   First MD Initiated Contact with Patient 01/08/23 [id number]     (approximate) History  Chest Pain  HPI Leslie Sims is a 72 y.o. male with a past medical history of COPD and ACS who presents via EMS complaining of left upper chest pain.  Patient states that he is currently being treated with doxycycline and prednisone for a COPD exacerbation and beginning yesterday he had a sharp, left upper chest pain that was 9/10 in severity and resolved spontaneously within the next few seconds.  Patient states when he woke up today he was having the same pain that did not resolve.  Patient denies any exacerbating or relieving factors for this pain.  Patient has received aspirin but refused nitroglycerin due to extreme hypotension the last time he got it.  Patient states that this pain has remained stable throughout the morning.  EMS noted wheezing during transport and patient was given 1 DuoNeb prior to arrival without any improvement in patient's pain.  Patient is reportedly on 2 and half liters by nasal cannula at baseline ROS: Patient currently denies any vision changes, tinnitus, difficulty speaking, facial droop, sore throat, shortness of breath, abdominal pain, nausea/vomiting/diarrhea, dysuria, or weakness/numbness/paresthesias in any extremity   Physical Exam  Triage Vital Signs: ED Triage Vitals  Enc Vitals Group     BP      Pulse      Resp      Temp      Temp src      SpO2      Weight      Height      Head Circumference      Peak Flow      Pain Score      Pain Loc      Pain Edu?      Excl. in [place]?    Most recent vital signs: Vitals:   01/08/23 1230 01/08/23 1235  BP: 136/66   Pulse: 79   Resp: 13   Temp:  98.5 F (36.9 C)  SpO2: 95%    General: Awake, oriented x4. CV:  Good peripheral perfusion.  Resp:  Normal effort.  Expiratory wheezing over bilateral lung fields. Abd:  No distention.  Other:  Elderly  Caucasian male laying in stretcher in no acute distress ED Results / Procedures / Treatments  Labs (all labs ordered are listed, but only abnormal results are displayed) Labs Reviewed  COMPREHENSIVE METABOLIC PANEL - Abnormal; Notable for the following components:      Result Value   Glucose, Bld 110 (*)    Calcium 8.7 (*)    Total Protein 6.2 (*)    All other components within normal limits  CBC WITH DIFFERENTIAL/PLATELET - Abnormal; Notable for the following components:   Abs Immature Granulocytes 0.10 (*)    All other components within normal limits  URINALYSIS, ROUTINE W REFLEX MICROSCOPIC - Abnormal; Notable for the following components:   Color, Urine STRAW (*)    APPearance CLEAR (*)    All other components within normal limits  RESP PANEL BY RT-PCR (RSV, FLU A&B, COVID)  RVPGX2  BRAIN NATRIURETIC PEPTIDE  CBC WITH DIFFERENTIAL/PLATELET  TROPONIN I (HIGH SENSITIVITY)  TROPONIN I (HIGH SENSITIVITY)   EKG ED ECG REPORT I, Naaman Plummer, the attending physician, personally viewed and interpreted this ECG. Date: 01/08/2023 EKG Time: 0826 Rate: 78 Rhythm: normal sinus rhythm QRS Axis: normal Intervals: normal ST/T Wave abnormalities: normal  Narrative Interpretation: no evidence of acute ischemia RADIOLOGY ED MD interpretation: One-view portable chest x-ray interpreted by me shows no evidence of acute abnormalities including no pneumonia, pneumothorax, or widened mediastinum -Agree with radiology assessment Official radiology report(s): DG Chest Port 1 View  Result Date: 01/08/2023 CLINICAL DATA:  Shortness of breath and chest pain EXAM: PORTABLE CHEST 1 VIEW COMPARISON:  12/21/2022 FINDINGS: Calcified granuloma at the left apex. Branching calcification at the right base. Apical lucency in the setting of emphysema. There is no edema, consolidation, effusion, or pneumothorax. Normal heart size and mediastinal contours. IMPRESSION: No acute finding. COPD. Electronically  Signed   By: Jorje Guild M.D.   On: 01/08/2023 08:42   PROCEDURES: Critical Care performed: No .1-3 Lead EKG Interpretation  Performed by: Naaman Plummer, MD Authorized by: Naaman Plummer, MD     Interpretation: normal     ECG rate:  71   ECG rate assessment: normal     Rhythm: sinus rhythm     Ectopy: none     Conduction: normal    MEDICATIONS ORDERED IN ED: Medications  acetaminophen (TYLENOL) tablet 650 mg (650 mg Oral Given 01/08/23 1046)  ipratropium-albuterol (DUONEB) 0.5-2.5 (3) MG/3ML nebulizer solution 6 mL (6 mLs Nebulization Given 01/08/23 1200)   IMPRESSION / MDM / ASSESSMENT AND PLAN / ED COURSE  I reviewed the triage vital signs and the nursing notes.                             The patient is on the cardiac monitor to evaluate for evidence of arrhythmia and/or significant heart rate changes. Patient's presentation is most consistent with acute presentation with potential threat to life or bodily function. 72 year old male presents for left upper chest wall pain Workup: ECG, CXR, CBC, BMP, Troponin Findings: ECG: No overt evidence of STEMI. No evidence of Brugadas sign, delta wave, epsilon wave, significantly prolonged QTc, or malignant arrhythmia HS Troponin: Negative x1 Other Labs unremarkable for emergent problems. CXR: Without PTX, PNA, or widened mediastinum Last Stress Test:  2017 Last Heart Catheterization:  2023 HEART Score: 4  Given History, Exam, and Workup I have low suspicion for ACS, Pneumothorax, Pneumonia, Pulmonary Embolus, Tamponade, Aortic Dissection or other emergent problem as a cause for this presentation.   Reassesment: Prior to discharge patients pain was controlled and they were well appearing.  Disposition:  Discharge. Strict return precautions discussed with patient with full understanding. Advised patient to follow up promptly with primary care provider    FINAL CLINICAL IMPRESSION(S) / ED DIAGNOSES   Final diagnoses:   Left-sided chest wall pain   Rx / DC Orders   ED Discharge Orders          Ordered    doxycycline (VIBRAMYCIN) 50 MG capsule  2 times daily        01/08/23 1252           Note:  This document was prepared using Dragon voice recognition software and may include unintentional dictation errors.   Naaman Plummer, MD 01/08/23 1254

## 2023-01-10 ENCOUNTER — Telehealth: Payer: Self-pay | Admitting: Pulmonary Disease

## 2023-01-10 NOTE — Telephone Encounter (Addendum)
This is unlikely due to the medication.  He has been on DuoNeb for a long long time.  He can go back to using DuoNeb twice a day followed by Pulmicort and then using an extra dose of DuoNeb if he develops breakthrough symptoms.  The symptoms he is having is either related to his esophageal issues or cardiac issues.

## 2023-01-10 NOTE — Telephone Encounter (Signed)
Patient is aware of below message/recommendations and voiced his understanding.  Nothing further needed.  

## 2023-01-10 NOTE — Telephone Encounter (Signed)
Spoke to patient.  He stated since starting duoneb QID and pulmicort 12/09/2022, he has been experiencing chest tightness/pain on left side of chest, occ increased SOB and wheezing. ED visit Saturday due to chest tightness. Sx have been present for roughly one month but have worsened over the past 2-3 days.  Dr. Patsey Berthold, please advise. Thanks.

## 2023-01-16 ENCOUNTER — Emergency Department: Payer: Medicare Other

## 2023-01-16 ENCOUNTER — Emergency Department
Admission: EM | Admit: 2023-01-16 | Discharge: 2023-01-16 | Disposition: A | Discharge status: Home / Self Care | Payer: Medicare Other | Attending: Emergency Medicine | Admitting: Emergency Medicine

## 2023-01-16 DIAGNOSIS — E119 Type 2 diabetes mellitus without complications: Secondary | ICD-10-CM | POA: Diagnosis not present

## 2023-01-16 DIAGNOSIS — J441 Chronic obstructive pulmonary disease with (acute) exacerbation: Secondary | ICD-10-CM | POA: Insufficient documentation

## 2023-01-16 DIAGNOSIS — I110 Hypertensive heart disease with heart failure: Secondary | ICD-10-CM | POA: Diagnosis not present

## 2023-01-16 DIAGNOSIS — Z9981 Dependence on supplemental oxygen: Secondary | ICD-10-CM | POA: Insufficient documentation

## 2023-01-16 DIAGNOSIS — I2510 Atherosclerotic heart disease of native coronary artery without angina pectoris: Secondary | ICD-10-CM | POA: Insufficient documentation

## 2023-01-16 DIAGNOSIS — I509 Heart failure, unspecified: Secondary | ICD-10-CM | POA: Diagnosis not present

## 2023-01-16 DIAGNOSIS — R0602 Shortness of breath: Secondary | ICD-10-CM | POA: Diagnosis present

## 2023-01-16 LAB — CBC
HCT: 40.3 % (ref 39.0–52.0)
Hemoglobin: 12.9 g/dL — ABNORMAL LOW (ref 13.0–17.0)
MCH: 29.8 pg (ref 26.0–34.0)
MCHC: 32 g/dL (ref 30.0–36.0)
MCV: 93.1 fL (ref 80.0–100.0)
Platelets: 199 10*3/uL (ref 150–400)
RBC: 4.33 MIL/uL (ref 4.22–5.81)
RDW: 13.2 % (ref 11.5–15.5)
WBC: 6.1 10*3/uL (ref 4.0–10.5)
nRBC: 0 % (ref 0.0–0.2)

## 2023-01-16 LAB — COMPREHENSIVE METABOLIC PANEL
ALT: 39 U/L (ref 0–44)
AST: 32 U/L (ref 15–41)
Albumin: 3.8 g/dL (ref 3.5–5.0)
Alkaline Phosphatase: 55 U/L (ref 38–126)
Anion gap: 11 (ref 5–15)
BUN: 12 mg/dL (ref 8–23)
CO2: 30 mmol/L (ref 22–32)
Calcium: 9.3 mg/dL (ref 8.9–10.3)
Chloride: 103 mmol/L (ref 98–111)
Creatinine, Ser: 1.02 mg/dL (ref 0.61–1.24)
GFR, Estimated: >60 mL/min (ref >60)
Glucose, Bld: 114 mg/dL — ABNORMAL HIGH (ref 70–99)
Potassium: 3.6 mmol/L (ref 3.5–5.1)
Sodium: 144 mmol/L (ref 135–145)
Total Bilirubin: 0.4 mg/dL (ref 0.3–1.2)
Total Protein: 6.7 g/dL (ref 6.5–8.1)

## 2023-01-16 LAB — TROPONIN I (HIGH SENSITIVITY): Troponin I (High Sensitivity): 13 ng/L (ref <18)

## 2023-01-16 MED ORDER — IPRATROPIUM-ALBUTEROL 0.5-2.5 (3) MG/3ML IN SOLN
3.0000 mL | Freq: Once | RESPIRATORY_TRACT | Status: AC
  Administered 2023-01-16: 3 mL via RESPIRATORY_TRACT
  Filled 2023-01-16: qty 3

## 2023-01-16 MED ORDER — AZITHROMYCIN 500 MG PO TABS
500.0000 mg | ORAL_TABLET | Freq: Once | ORAL | Status: AC
  Administered 2023-01-16: 500 mg via ORAL
  Filled 2023-01-16: qty 1

## 2023-01-16 MED ORDER — PREDNISONE 10 MG PO TABS: 10.0000 mg | ORAL_TABLET | Freq: Every day | ORAL | 0 refills | Status: DC

## 2023-01-16 MED ORDER — METHYLPREDNISOLONE SODIUM SUCC 125 MG IJ SOLR
125.0000 mg | Freq: Once | INTRAMUSCULAR | Status: AC
  Administered 2023-01-16: 125 mg via INTRAVENOUS
  Filled 2023-01-16: qty 2

## 2023-01-16 MED ORDER — AZITHROMYCIN 250 MG PO TABS: ORAL_TABLET | ORAL | 0 refills | Status: AC

## 2023-01-16 NOTE — ED Triage Notes (Signed)
Pt from home for SOB x24 hours, baseline 2.5 L [place], pt reports O2 dropping into 80's w/ exertion.  Pt reports 3/10 CP, sharp, middle chest, nonradiating.

## 2023-01-16 NOTE — ED Provider Notes (Signed)
[facility] Provider Note    Event Date/Time   First MD Initiated Contact with Patient 01/16/23 2118     (approximate)  History   Chief Complaint: Shortness of Breath  HPI  Leslie Sims is a 72 y.o. male with a past medical history of CHF, COPD on 2.5 L of oxygen at baseline, CAD, diabetes, gastric reflux, hypertension, hyperlipidemia, presents to the emergency department for shortness of breath.  According to the patient for the past week or 2 he has been experiencing some intermittent chest discomfort.  States he came to the emergency department last week for the same.  Patient follows up with his pulmonologist for his chronic COPD for which he wears 2.5 L of oxygen at all times.  He states of the last few days he has felt somewhat more short of breath.  Has been checking his home pulse oximeter and it has been in the low 90s and sometimes in the upper 80s with ambulation.  Patient states slight cough but denies any fever.  Patient states he has been in contact with his pulmonologist who has adjusted his medications but he did not feel better so he came to the emergency department for evaluation.  Physical Exam   Triage Vital Signs: ED Triage Vitals  Enc Vitals Group     BP 01/16/23 2119 (!) 157/101     Pulse Rate 01/16/23 2119 83     Resp 01/16/23 2119 18     Temp 01/16/23 2119 98.6 F (37 C)     Temp Source 01/16/23 2119 Oral     SpO2 01/16/23 2119 97 %     Weight 01/16/23 2117 194 lb (88 kg)     Height 01/16/23 2117 '5\' 9"'$  (1.753 m)     Head Circumference --      Peak Flow --      Pain Score 01/16/23 2116 3     Pain Loc --      Pain Edu? --      Excl. in [place]? --     Most recent vital signs: Vitals:   01/16/23 2119  BP: (!) 157/101  Pulse: 83  Resp: 18  Temp: 98.6 F (37 C)  SpO2: 97%    General: Awake, no distress.  CV:  Good peripheral perfusion.  Regular rate and rhythm  Resp:  Normal effort.  Equal breath sounds bilaterally.   Slight expiratory wheeze Abd:  No distention.  Soft, nontender.  No rebound or guarding.  ED Results / Procedures / Treatments   EKG  EKG viewed and interpreted by myself shows a normal sinus rhythm at 80 bpm with a widened QRS, normal axis, normal intervals, nonspecific ST changes.  RADIOLOGY  Patient's chest x-ray viewed and interpreted by myself does not appear to show any obvious consolidation. Radiology has read the x-ray as negative   [place] ED: Medications  ipratropium-albuterol (DUONEB) 0.5-2.5 (3) MG/3ML nebulizer solution 3 mL (has no administration in time range)  ipratropium-albuterol (DUONEB) 0.5-2.5 (3) MG/3ML nebulizer solution 3 mL (has no administration in time range)     IMPRESSION / MDM / ASSESSMENT AND PLAN / ED COURSE  I reviewed the triage vital signs and the nursing notes.  Patient's presentation is most consistent with acute presentation with potential threat to life or bodily function.  Patient presents emergency department for shortness of breath and intermittent chest discomfort.  Overall the patient appears well currently satting in the upper 90s on 2.5 L.  Vital signs otherwise reassuring.  Very slight expiratory wheeze on exam we will dose to DuoNebs.  Remainder the physical exam is nonrevealing no lower extremity edema.  We will check labs including cardiac enzymes we will obtain a chest x-ray and continue to closely monitor.  Patient agreeable to plan of care.  Patient's chest x-ray is negative.  Lab work is reassuring including a normal chemistry reassuring CBC and a negative troponin.  Given the patient's reassuring workup I suspect more of a COPD exacerbation.  Currently satting in the mid upper 90s on his typical 2.5 L.  We will dose Solu-Medrol and start the patient on Zithromax we will have the patient follow-up with his pulmonologist.  Patient agreeable to plan of care.  I sent a note to the patient's pulmonologist as well.  Provided  my typical dyspnea return precautions.  FINAL CLINICAL IMPRESSION(S) / ED DIAGNOSES   COPD Dyspnea   Note:  This document was prepared using Dragon voice recognition software and may include unintentional dictation errors.   Harvest Dark, MD 01/16/23 [phone]

## 2023-01-17 ENCOUNTER — Telehealth: Payer: Self-pay | Admitting: Pulmonary Disease

## 2023-01-17 NOTE — Telephone Encounter (Signed)
Patient is aware of below message/recommendations and voiced his understanding.  Appt scheduled 01/19/2023 at 11:30. Nothing further needed.

## 2023-01-17 NOTE — Telephone Encounter (Signed)
I reviewed the notes from the ED the imaging.  Needs to start the medications that were prescribed.  These should make him feel better.  Could potentially see him on an 11:30 slot either Wednesday or Friday.

## 2023-01-17 NOTE — Telephone Encounter (Signed)
Spoke to patient.  He stated that he was seen at ED over the weekend for COPDE. He was tx with zpak and prednisone.  He was told to follow up with Dr. Patsey Berthold sooner then 01/27/2023. He has not started zpak and prednisone yet. He plans to start it today.  He reports of increased SOB with exertion, dry cough, wheezing and left chest pain in middle of breast.  Denied f/c/s or additional sx.  He wears 2.5L normally, but he increased it to 3L. Spo2 maintaining 92-96% at rest.  Dr. Patsey Berthold, please advise. Thanks.

## 2023-01-19 ENCOUNTER — Ambulatory Visit (INDEPENDENT_AMBULATORY_CARE_PROVIDER_SITE_OTHER): Payer: Medicare Other | Admitting: Pulmonary Disease

## 2023-01-19 ENCOUNTER — Encounter: Payer: Self-pay | Admitting: Pulmonary Disease

## 2023-01-19 VITALS — BP 124/70 | HR 99 | Temp 98.4°F | Ht 69.0 in | Wt 194.0 lb

## 2023-01-19 DIAGNOSIS — J449 Chronic obstructive pulmonary disease, unspecified: Secondary | ICD-10-CM

## 2023-01-19 DIAGNOSIS — R0789 Other chest pain: Secondary | ICD-10-CM

## 2023-01-19 DIAGNOSIS — C159 Malignant neoplasm of esophagus, unspecified: Secondary | ICD-10-CM

## 2023-01-19 DIAGNOSIS — J441 Chronic obstructive pulmonary disease with (acute) exacerbation: Secondary | ICD-10-CM

## 2023-01-19 DIAGNOSIS — J9611 Chronic respiratory failure with hypoxia: Secondary | ICD-10-CM | POA: Diagnosis not present

## 2023-01-19 DIAGNOSIS — Z87891 Personal history of nicotine dependence: Secondary | ICD-10-CM

## 2023-01-19 DIAGNOSIS — K219 Gastro-esophageal reflux disease without esophagitis: Secondary | ICD-10-CM

## 2023-01-19 MED ORDER — LANSOPRAZOLE 30 MG PO CPDR: 30.0000 mg | DELAYED_RELEASE_CAPSULE | Freq: Two times a day (BID) | ORAL | 1 refills | Status: DC

## 2023-01-19 NOTE — Progress Notes (Signed)
Subjective:    Patient ID: Leslie Sims, male    DOB: Dec 19, 1951, 72 y.o.   MRN: [id number] Patient Care Team: [name], MD as PCP - General (Family Medicine) Wellington Hampshire, MD as PCP - Cardiology (Cardiology) [name], MD (Family Medicine) Wellington Hampshire, MD as Consulting Physician (Cardiology) Telford Nab, RN as Registered Nurse [name], MD as Consulting Physician (Pulmonary Disease) Noreene Filbert, MD as Radiation Oncologist (Radiation Oncology) Clent Jacks, RN as Oncology Nurse Navigator [name], MD as Consulting Physician (Oncology)  Chief Complaint  Patient presents with   Follow-up    Left side chest pain. SOB with exertion. Some wheezing. Dry cough. ED on 1/28 and 1/20.   Patient ID: Leslie Sims, male    DOB: 02-05-1951, 72 y.o.   MRN: [id number] Requesting MD/Service: [name], MD Date of initial consultation: 08/10/16 by Dr. [name] Reason for consultation: COPD, smoker   PT PROFILE: 45 M, former smoker quit 02/10/2019 with severe emphysema seen by multiple pulmonologists in past ([place], [place], [place], [place]) referred for further eval and mgmt of COPD.  Previously seen by Dr. [name].   DATA/EVENTS: 07/24/12 Office Spirometry: Severe obstruction (FEV1 31% pred) 08/31/13 Office Spirometry: very severe obstruction (FEV1 25% pred) 06/18/15 CT chest: Severe emphysema 03/03/17 Overnight oximetry: Lowest SpO2 72%. 95% of time SpO2 was below 90% 03/03/17 6MWT: 384 meters. Desat to 84% 10/04/17 CT chest: Two new irregularly marginated nodules, one in the RUL, and a second in the mid LUL worrisome for either synchronous lung ca or metastatic involvement of the lungs. The two small nodules described on the prior dictated report have resolved in the left superior segment and most likely were postinflammatory. Diffuse centrilobular emphysema. 10/13/17 PET scan: The new left upper lobe nodule is FDG avid consistent with  malignancy. Recommend tissue confirmation. The new nodule in the right upper lobe demonstrates low level uptake. While the level of uptake suggests the possibility of an inflammatory or infectious process, a low-grade malignancy is not excluded. Given the suspected malignancy on the left, tissue confirmation should be considered on the right despite the low level of uptake. No distant metastatic disease identified 10/27/17 ENB ([place]): Transbronchial Fine Needle Aspirations 21G X7, Transbronchial Forceps Biopsy X6. ALVEOLATED LUNG TISSUE WITH HEMOSIDERIN LADEN MACROPHAGES.  NEGATIVE FOR ATYPIA AND MALIGNANCY 11/01/17 PFTs: FVC: 2.96 > 3.14 L (68 > 72 %pred), FEV1: 0.94 > 0.93 L (27 %pred), FEV1/FVC: 32%, TLC: 9.04 L (137 %pred), DLCO 62 %pred 12/07/17 CTA chest: No pulmonary embolus identified. Anterior left upper lobe ground-glass and nodular consolidation likely representing pneumonitis. Stable bilateral upper lobe pulmonary nodules. Stable emphysema. 01/21/18 CT chest: new masslike architectural distortion and ground-glass attenuation surrounding the index nodule within the RUL. The appearance favors an inflammatory or infectious process. The index nodule in the left upper lobe is slightly decreased in volume when compared with the previous exam favoring a benign process. Attention on follow-up imaging advise. Advanced changes of emphysema (very severe) 06/01/18 CTA chest: No evidence of pulmonary embolism. Waxing and waning bilateral pulmonary nodular process as described. New 8 mm nodular ill-defined focus of opacification over the lingula which may represent an acute inflammatory or infectious process. No effusion. Emphysema 10/19/18 CTA chest: No acute cardiopulmonary disease and no evidence of pulm embolism. Hospitalization 1/12-1/14/2020: COPD exacerbation Hospitalization 2/22-2/27/20: COPD exacerbation, HCAP 02/11/19 CT chest: Progressed masslike consolidation within the RUL in the region of  previous waxing and waning nodule, this is contiguous  with the pleural surface and demonstrates spiculated margin and probable small focus of central cavitation. Findings could be secondary to infection superimposed on chronic nodule. However cavitary neoplasm is also a concern. Multiple additional lung nodules which have not significantly changed. Small foci of ground-glass density in the RUL may reflect small focus of infection. Mod-severe emphysema 05/01/19 CT chest: Waxing/waning right lung opacities, suggesting infection, including atypical/mycobacterial infection. Improvement of the prior dominant masslike opacity argues against neoplasm. Stable left upper lobe nodule, likely benign. Aortic Atherosclerosis and Emphysema 11/02/2019 CT chest: Multiple larger pulmonary nodules and masslike areas seen on the prior study have largely regressed.  Findings indicative of infectious/inflammatory process.  Diffuse bronchial wall thickening with moderate centrilobular and paraseptal emphysema.  Suggestion of left main and three-vessel coronary artery disease. 04/23/2020 CT chest: "Multiple bilateral stable pulmonary nodules. New 7 mm nodule over the posterior left upper lobe. Stable nodularity over an area ofright upper lobe/apical scarring with new 9 mm nodular focus along the most inferior aspect of this scarring. This area has shown to wax and wane on previous exams and is most likely inflammatory". 07/24/2020 CT chest: "Background of marked pulmonary emphysema with stable post infectious or inflammatory changes and with near complete resolution of a presumably infectious or inflammatory nodule in the LEFT upper lobe as compared to previous exam". 01/21/2021 CT chest: Severe emphysematous changes, pulmonary scarring, bilateral calcified granulomas. 01/29/2021 PFTs: Further decline in lung function.  FEV1 0.74 L or 22% predicted, FVC 1.91 L or 42% predicted.  No significant bronchodilator response.  FEV1/FVC 39%.   Patient could not perform lung volumes. Diffusion capacity severely reduced.  This is consistent with very severe COPD. 02/13/2021 2D echo: LVEF 40 to 45% global hypokinesis, severe hypokinesis of the periapical region.  Grade II diastolic dysfunction, dilated left atrium. 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  Biopsies high-grade dysplastic. 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 05/09/2022 PET/CT: Intense tracer uptake associated with the distal esophageal mass. 06/03/2022 chemotherapy-XRT: Patient began concurrent carboplatin/Taxol/XRT. 07/26/2022 completed XRT: Completing chemotherapy. 09/02/2022 PET/CT: Distal esophageal wall thickening with significant reduction in abnormal FDG activity, small focus of minimally metabolic activity, possibly reflecting posttreatment change.  No evidence of hypermetabolic metastatic disease. 09/09/2022 echocardiogram: LVEF 50 to 55%, grade 1 DD, normal RV function, 10/11/2022 chest x-ray: Hyperinflation of the lungs no acute cardiopulmonary disease.  Granuloma of the LEFT upper lobe. 12/06/2022 PET/CT: No evidence of local recurrence or metastatic disease after treatment for esophageal cancer.  Stable bilateral adrenal adenomas. 01/16/2023 chest x-ray: Changes consistent with COPD with no active disease.   INTERVAL: Last seen by  me on 09 December 2022.   Since then was seen in the ED on 16 January 2023 treated with Solu-Medrol and Zithromax.  Complains of burning chest pain particularly on the left side.  HPI The patient is a 72 year old number smoker (quit 2020, 126 PY) with severe stage IV COPD and chronic hypoxic respiratory failure on supplemental oxygen, who presents here for follow-up on these issues.  This is a scheduled visit.  Since his last visit he had to be seen in the emergency room on 16 January 2023 due to chest pain.  I have  reviewed the ED report.  The patient's main complaint is that of burning chest pain, severe dyspepsia, shortness of breath is pretty much at baseline which is Grade III.  He was treated with Azithromycin Z-Pak, he received Solu-Medrol in  the ED.  He is currently on the Azithromycin and a prednisone taper.  He has felt perhaps somewhat better but still has the discomfort in his chest.  He has not had any fevers, chills or sweats.  Sputum has not been overly discolored.  No hemoptysis.  No nausea, vomiting or hematemesis.  He has had however, worsening dyspepsia and gastroesophageal reflux symptoms.  He is maintained on DuoNebs and budesonide via nebulizer as he does not have breath-holding capacity for proper use of DPI/MDI.  He is not currently on PPIs, he is on Carafate twice a day but does not take it regularly.  His respiratory care is complicated by ischemic cardiomyopathy with LVEF of 40% and grade II diastolic dysfunction giving him chronic combined systolic and diastolic heart failure.  In addition the patient has had diagnosis of esophageal squamous cell carcinoma sitter cured after concurrent chemotherapy and radiation.  Completed these and August 2023.  He is on continued CT surveillance every 3 months.  He also has a history of prostate cancer status post radiation in the past.  Review of Systems A 10 point review of systems was performed and it is as noted above otherwise negative.  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   Anemia due to antineoplastic chemotherapy 06/26/2022   Constipation 06/19/2022   Encounter for antineoplastic chemotherapy 05/31/2022   Weight loss 05/31/2022   Dysphagia 05/31/2022   Primary esophageal squamous cell carcinoma ([place]) 05/04/2022   COPD exacerbation ([place]) 07/31/2021   Prostate cancer ([place]) 10/11/2020   Elevated PSA 07/28/2020   Iron deficiency anemia 05/20/2020   Goals of care, counseling/discussion     Palliative care by specialist    Healthcare-associated pneumonia 01/28/2019   COPD with acute exacerbation ([place]) 12/31/2018   Generalized anxiety disorder 11/12/2018   Moderate episode of recurrent major depressive disorder ([place]) 11/12/2018   Chronic respiratory failure with hypoxia ([place]) 09/29/2018   Acute respiratory failure with hypoxia and hypercapnia ([place]) 09/13/2018   Acute on chronic respiratory failure with hypoxia and hypercapnia (HCC) 08/24/2018   Atypical chest pain 07/02/2018   Grief reaction 01/13/2018   COPD with hypoxia ([place]) 12/25/2017   Unstable angina (HCC)    Benign neoplasm of ascending colon    Benign neoplasm of descending colon    Benign neoplasm of sigmoid colon    History of colonic polyps    Pulmonary nodules/lesions, multiple 01/05/2015   Bruit of left carotid artery 11/04/2014   Sleep disorder 07/18/2013   Seasonal and perennial allergic rhinitis 05/29/2013   Bradycardia 04/20/2011   SMOKER 07/30/2010   CAD, NATIVE VESSEL 04/23/2010   Hyperlipidemia 03/24/2010   DEPRESSION/ANXIETY 03/24/2010   Chronic heart failure with preserved ejection fraction (HFpEF) ([place]) 03/24/2010   COPD, very severe ([place]) 03/24/2010   GERD 03/24/2010   Essential hypertension 11/21/2009   Social History   Tobacco Use   Smoking status: Former    Packs/day: 3.00    Years: 42.00    Total pack years: 126.00    Types: Cigarettes    Quit date: 02/10/2019    Years since quitting: 3.9   Smokeless tobacco: Never   Tobacco comments:    25 months ago most smoked 4 packs a day .  Substance Use Topics   Alcohol use: No    Alcohol/week: 0.0 standard drinks of alcohol   Allergies  Allergen Reactions   Prozac [Fluoxetine Hcl] Shortness Of Breath   Hydroxyzine    Bupropion Other (  See Comments)    "Makes me feel funny" Other reaction(s): Other (See Comments) Unknown/"made me feel real funny" "Makes me feel funny" "Makes me feel funny" Other reaction(s): Other (See Comments)  Unknown/"made me feel real funny" "Makes me feel funny"   Effexor Xr [Venlafaxine Hcl Er] Other (See Comments)    "Makes me feel funny"   Current Meds  Medication Sig   albuterol (VENTOLIN HFA) 108 (90 Base) MCG/ACT inhaler TAKE 2 PUFFS BY MOUTH EVERY 6 HOURS AS NEEDED FOR WHEEZE OR SHORTNESS OF BREATH   aspirin 81 MG tablet Take 81 mg by mouth daily.   atorvastatin (LIPITOR) 80 MG tablet Take 1 tablet (80 mg total) by mouth daily.   azithromycin (ZITHROMAX Z-PAK) 250 MG tablet Take 2 tablets (500 mg) on  Day 1,  followed by 1 tablet (250 mg) once daily on Days 2 through 5.   budesonide (PULMICORT) 0.5 MG/2ML nebulizer solution Take 2 mLs (0.5 mg total) by nebulization in the morning and at bedtime.   docusate sodium (COLACE) 100 MG capsule Take 1 capsule (100 mg total) by mouth 2 (two) times daily.   esomeprazole (NEXIUM) 40 MG capsule TAKE ONE CAPSULE BY MOUTH ONCE DAILY   fluticasone (FLONASE) 50 MCG/ACT nasal spray Place 2 sprays into both nostrils daily.   furosemide (LASIX) 20 MG tablet Take 1 tablet (20 mg total) by mouth daily.   ipratropium-albuterol (DUONEB) 0.5-2.5 (3) MG/3ML SOLN Take 3 mLs by nebulization 2 (two) times daily.   ipratropium-albuterol (DUONEB) 0.5-2.5 (3) MG/3ML SOLN Take 3 mLs by nebulization every 6 (six) hours as needed.   isosorbide mononitrate (IMDUR) 30 MG 24 hr tablet Take 1 tablet (30 mg total) by mouth daily.   ketoconazole (NIZORAL) 2 % cream Apply topically 2 (two) times daily.   loratadine (CLARITIN) 10 MG tablet Take 10 mg by mouth daily.   LORazepam (ATIVAN) 1 MG tablet Take 1 tablet (1 mg total) by mouth 3 (three) times daily as needed for anxiety.   losartan (COZAAR) 25 MG tablet Take 1 tablet (25 mg total) by mouth daily.   mirtazapine (REMERON) 45 MG tablet Take 45 mg by mouth at bedtime.   Multiple Vitamin (MULTIVITAMIN) capsule Take 1 capsule by mouth daily.   OXYGEN Inhale 2.5 L into the lungs daily.   polyethylene glycol (MIRALAX) 17 g packet  Take 17 g by mouth daily as needed.   predniSONE (DELTASONE) 10 MG tablet Take 1 tablet (10 mg total) by mouth daily. Day 1-3: take 4 tablets PO daily Day 4-6: take 3 tablets PO daily Day 7-9: take 2 tablets PO daily Day 10-12: take 1 tablet PO daily   Spacer/Aero-Holding Chambers (AEROCHAMBER MV) inhaler Use as instructed   sucralfate (CARAFATE) 1 g tablet Take 1 g by mouth 2 (two) times daily with a meal.   Immunization History  Administered Date(s) Administered   Fluad Quad(high Dose 65+) 11/16/2021, 11/05/2022   Influenza Whole 09/19/2012   Influenza, High Dose Seasonal PF 09/28/2019   Influenza,inj,Quad PF,6+ Mos 10/24/2013, 11/25/2014, 03/02/2016, 10/27/2016, 10/05/2017   Influenza-Unspecified 10/24/2013, 11/25/2014, 03/02/2016, 10/27/2016, 10/05/2017, 09/18/2018, 09/28/2019, 09/30/2020   Moderna Sars-Covid-2 Vaccination 01/26/2020, 02/23/2020   PFIZER Comirnaty(Gray Top)Covid-19 Tri-Sucrose Vaccine 12/15/2020, 07/15/2021   PFIZER(Purple Top)SARS-COV-2 Vaccination 01/26/2020, 02/23/2020   PNEUMOCOCCAL CONJUGATE-20 03/22/2022   Pfizer Covid-19 Vaccine Bivalent Booster 23yr & up 01/14/2022, 05/25/2022   Pneumococcal Conjugate-13 11/25/2014   Pneumococcal Polysaccharide-23 08/24/2016, 10/27/2016   Pneumococcal-Unspecified 12/20/2008   Tdap 12/20/2008       Objective:  Physical Exam BP 124/70 (BP Location: Left Arm, Cuff Size: Normal)   Pulse 99   Temp 98.4 F (36.9 C)   Ht '5\' 9"'$  (1.753 m)   Wt 194 lb (88 kg)   SpO2 96%   BMI 28.65 kg/m   SpO2: 96 % O2 Device: Nasal cannula O2 Flow Rate (L/min): 3 L/min  GENERAL: Well-developed well-nourished gentleman, chronic use of accessories, no conversational dyspnea. No acute distress.  Presents in transport chair due to dyspnea. Using oxygen via portable concentrator at 2 L pulsed. HEAD: Normocephalic, atraumatic. EYES: Pupils equal, round, reactive to light.  No scleral icterus. MOUTH: Wears dentures. NECK: Supple. No  thyromegaly. No nodules. No JVD.  Trachea midline, no crepitus. PULMONARY: Very distant breath sounds, no adventitious sounds. CARDIOVASCULAR: S1 and S2. Regular rate and rhythm.  No rubs murmurs or gallops heard. GASTROINTESTINAL: Slightly protuberant abdomen, soft, benign. MUSCULOSKELETAL: No joint deformity, no clubbing, no edema.  Mild costochondral junction tenderness on the left. NEUROLOGIC: Awake, alert, oriented.  No overt focal deficits.  Speech is fluent. SKIN: Intact,warm,dry.  Limited exam: No rashes. PSYCH: Normal mood and behavior.      Assessment & Plan:     ICD-10-CM   1. Stage 4 very severe COPD by GOLD classification ([place])  J44.9    Appears to be at baseline Continue DuoNebs 4 times a day Continue Pulmicort twice a day    2. COPD exacerbation (HCC)  J44.1    Currently completing azithromycin and prednisone Not convinced that he had an actual exacerbation    3. Chronic hypoxemic respiratory failure (HCC)  J96.11    Compliant with oxygen at 3 L/min Continue same    4. Atypical chest pain  R07.89 DG ESOPHAGUS W SINGLE CM (SOL OR THIN BA)   Suspect related to severe GERD    5. Gastroesophageal reflux disease, unspecified whether esophagitis present  K21.9 DG ESOPHAGUS W SINGLE CM (SOL OR THIN BA)   Upper GI study to evaluate for reflux Lansoprazole 30 mg twice daily Discontinue Carafate    6. Primary esophageal squamous cell carcinoma (HCC)  C15.9 DG ESOPHAGUS W SINGLE CM (SOL OR THIN BA)   Patient appears to have achieved cure May have reflux symptoms post radiation therapy On surveillance every 3 months Follows with oncology    7. Former smoker  Z87.891    No evidence of recurrence     Orders Placed This Encounter  Procedures   DG ESOPHAGUS W SINGLE CM (SOL OR THIN BA)    Standing Status:   Future    Standing Expiration Date:   01/20/2024    Order Specific Question:   Reason for Exam (SYMPTOM  OR DIAGNOSIS REQUIRED)    Answer:   History of Esophageal  Cancer    Order Specific Question:   Preferred imaging location?    Answer:   [facility] ordered this encounter  Medications   lansoprazole (PREVACID) 30 MG capsule    Sig: Take 1 capsule (30 mg total) by mouth 2 (two) times daily before a meal.    Dispense:  60 capsule    Refill:  1   We will see the patient in follow-up in 4 to 6 weeks time he is to contact us prior to that time should any new difficulties arise.  Renold Don, MD Advanced Bronchoscopy PCCM [facility]    *This note was dictated using voice recognition software/Dragon.  Despite best efforts to proofread, errors can occur which can  change the meaning. Any transcriptional errors that result from this process are unintentional and may not be fully corrected at the time of dictation.

## 2023-01-19 NOTE — Patient Instructions (Signed)
We are getting a barium study of your esophagus to see if you are having some significant reflux.  Stop taking the Carafate.  Stop other antacid pills.  We have sent a new antacid pill for you to take twice a day before meals.  This was sent to your pharmacy.  Your DuoNeb (ipratropium/albuterol) should be used 4 times a day.  Continue using the budesonide (Pulmicort) twice a day after a DuoNeb dose.  We will call you with the results of the barium study once we know.  We will see you in follow-up in 4 to 6 weeks time call sooner should any new problems arise.

## 2023-01-20 ENCOUNTER — Telehealth: Payer: Self-pay | Admitting: Pulmonary Disease

## 2023-01-20 NOTE — Telephone Encounter (Signed)
Routing to PA team. Patient is aware.

## 2023-01-22 ENCOUNTER — Encounter: Payer: Self-pay | Admitting: Pulmonary Disease

## 2023-01-26 ENCOUNTER — Ambulatory Visit
Admission: RE | Admit: 2023-01-26 | Discharge: 2023-01-26 | Disposition: A | Discharge status: Home / Self Care | Payer: Medicare Other | Source: Ambulatory Visit | Attending: Pulmonary Disease | Admitting: Pulmonary Disease

## 2023-01-26 ENCOUNTER — Other Ambulatory Visit
Admission: RE | Admit: 2023-01-26 | Discharge: 2023-01-26 | Disposition: A | Discharge status: Home / Self Care | Payer: Medicare Other | Source: Ambulatory Visit | Attending: Pulmonary Disease | Admitting: Pulmonary Disease

## 2023-01-26 DIAGNOSIS — E782 Mixed hyperlipidemia: Secondary | ICD-10-CM

## 2023-01-26 DIAGNOSIS — C159 Malignant neoplasm of esophagus, unspecified: Secondary | ICD-10-CM | POA: Diagnosis present

## 2023-01-26 DIAGNOSIS — K219 Gastro-esophageal reflux disease without esophagitis: Secondary | ICD-10-CM | POA: Insufficient documentation

## 2023-01-26 DIAGNOSIS — R0789 Other chest pain: Secondary | ICD-10-CM | POA: Insufficient documentation

## 2023-01-26 LAB — LIPID PANEL
Cholesterol: 156 mg/dL (ref 0–200)
HDL: 87 mg/dL (ref >40)
LDL Cholesterol: 47 mg/dL (ref 0–99)
Total CHOL/HDL Ratio: 1.8 RATIO
Triglycerides: 110 mg/dL (ref <150)
VLDL: 22 mg/dL (ref 0–40)

## 2023-01-26 NOTE — Telephone Encounter (Signed)
I spoke with the patient. He said he has already pick up the prescription.   Nothing further needed.

## 2023-01-26 NOTE — Telephone Encounter (Signed)
Per test claim, 60 caps per 30 days does NOT require a prior authorization and patient has a $5.00 co-pay

## 2023-01-27 ENCOUNTER — Ambulatory Visit: Payer: Medicare Other | Admitting: Pulmonary Disease

## 2023-01-27 NOTE — Progress Notes (Signed)
Results have improved. Continue current medication regimen without changes.

## 2023-01-28 ENCOUNTER — Other Ambulatory Visit (HOSPITAL_COMMUNITY): Payer: Self-pay

## 2023-01-31 ENCOUNTER — Telehealth: Payer: Self-pay

## 2023-01-31 NOTE — Telephone Encounter (Signed)
PA request received via CMM for Lansoprazole 30MG dr capsules  PA has been submitted to [facility] and is pending determination.   Key: [id number]

## 2023-02-02 ENCOUNTER — Telehealth: Payer: Self-pay | Admitting: Pulmonary Disease

## 2023-02-02 MED ORDER — METHYLPREDNISOLONE 4 MG PO TBPK: ORAL_TABLET | ORAL | 0 refills | Status: DC

## 2023-02-02 NOTE — Telephone Encounter (Signed)
I have sent in the medication and notified the patient.  Nothing further needed.

## 2023-02-02 NOTE — Telephone Encounter (Signed)
PA has been APPROVED from 01/31/2023-12/20/2023

## 2023-02-02 NOTE — Telephone Encounter (Signed)
Called and spoke to patient. He reports of increased SOB with exertion, wheezing and prod cough with clear sputum x3-4d. Denied f/c/s or additional sx.  No recent covid or flu test. He wears 3L cont. Spo2 is maintaining around 92-94%.  Not currently taking prednisone or abx. He is taking duoneb 4-5x daily and pulmicort BID.   Dr. Patsey Berthold, please advise. Thanks

## 2023-02-02 NOTE — Telephone Encounter (Signed)
May call in a Medrol Dosepak.  I do not think he needs antibiotics at present.  I recommend that he check test for COVID.  If symptoms persist he needs to go to the emergency room.

## 2023-02-04 ENCOUNTER — Telehealth: Payer: Self-pay | Admitting: Pulmonary Disease

## 2023-02-04 NOTE — Telephone Encounter (Signed)
Dr. Lendell Caprice- this is just an FYI.

## 2023-02-04 NOTE — Telephone Encounter (Addendum)
I spoke with the patient. He said he is not feeling any better, and said he thinks he is worse. He said the Prevacid was not helping and he is having burning in his chest and stomach. He cut down on the dose and was still having pain.  Per Dr. Domingo Dimes last telephone encounter on 02/02/2023, If symptoms persist he needs to go to the emergency room.   I notified the patient. He will present to the ER.   Nothing further needed.

## 2023-02-04 NOTE — Telephone Encounter (Signed)
Noted  

## 2023-02-11 ENCOUNTER — Encounter: Payer: Self-pay | Admitting: Pulmonary Disease

## 2023-02-12 ENCOUNTER — Other Ambulatory Visit: Payer: Self-pay | Admitting: Pulmonary Disease

## 2023-02-14 NOTE — Telephone Encounter (Signed)
Dr. Gonzalez, please advise if okay to refill? Thanks ?

## 2023-02-14 NOTE — Telephone Encounter (Signed)
I suspect this was inadvertently discontinued through the ED.  Please continue azithromycin 250 mg 3 times a week.

## 2023-02-15 ENCOUNTER — Other Ambulatory Visit: Payer: Self-pay

## 2023-02-15 MED ORDER — LANSOPRAZOLE 30 MG PO CPDR: 30.0000 mg | DELAYED_RELEASE_CAPSULE | Freq: Two times a day (BID) | ORAL | 1 refills | Status: DC

## 2023-02-17 ENCOUNTER — Other Ambulatory Visit: Payer: Self-pay | Admitting: Pulmonary Disease

## 2023-03-01 ENCOUNTER — Encounter: Payer: Self-pay | Admitting: Pulmonary Disease

## 2023-03-01 ENCOUNTER — Ambulatory Visit (INDEPENDENT_AMBULATORY_CARE_PROVIDER_SITE_OTHER): Payer: Medicare Other | Admitting: Pulmonary Disease

## 2023-03-01 VITALS — BP 110/70 | HR 104 | Temp 97.8°F | Ht 69.0 in | Wt 197.4 lb

## 2023-03-01 DIAGNOSIS — Z87891 Personal history of nicotine dependence: Secondary | ICD-10-CM

## 2023-03-01 DIAGNOSIS — K219 Gastro-esophageal reflux disease without esophagitis: Secondary | ICD-10-CM

## 2023-03-01 DIAGNOSIS — J9611 Chronic respiratory failure with hypoxia: Secondary | ICD-10-CM | POA: Diagnosis not present

## 2023-03-01 DIAGNOSIS — J449 Chronic obstructive pulmonary disease, unspecified: Secondary | ICD-10-CM | POA: Diagnosis not present

## 2023-03-01 MED ORDER — AZITHROMYCIN 250 MG PO TABS: ORAL_TABLET | ORAL | 0 refills | Status: DC

## 2023-03-01 NOTE — Progress Notes (Signed)
Subjective:    Patient ID: Leslie Sims, male    DOB: Dec 13, 1951, 72 y.o.   MRN: [id number] Patient Care Team: [name], MD as PCP - General (Family Medicine) Wellington Hampshire, MD as PCP - Cardiology (Cardiology) Wellington Hampshire, MD as Consulting Physician (Cardiology) Telford Nab, RN as Registered Nurse [name], MD as Consulting Physician (Pulmonary Disease) Noreene Filbert, MD as Radiation Oncologist (Radiation Oncology) Clent Jacks, RN as Oncology Nurse Navigator [name], MD as Consulting Physician (Oncology)  Chief Complaint  Patient presents with   Follow-up    SOB with exertion. Some wheezing. Cough with white sputum.    Patient ID: Leslie Sims, male    DOB: March 27, 1951, 72 y.o.   MRN: [id number] Requesting MD/Service: [name], MD Date of initial consultation: 08/10/16 by Dr. [name] Reason for consultation: COPD, smoker   PT PROFILE: 18 M, former smoker quit 02/10/2019 with severe emphysema seen by multiple pulmonologists in past ([place], [place], [place], [place]) referred for further eval and mgmt of COPD.  Previously seen by Dr. [name].   DATA/EVENTS: 07/24/12 Office Spirometry: Severe obstruction (FEV1 31% pred) 08/31/13 Office Spirometry: very severe obstruction (FEV1 25% pred) 06/18/15 CT chest: Severe emphysema 03/03/17 Overnight oximetry: Lowest SpO2 72%. 95% of time SpO2 was below 90% 03/03/17 6MWT: 384 meters. Desat to 84% 10/04/17 CT chest: Two new irregularly marginated nodules, one in the RUL, and a second in the mid LUL worrisome for either synchronous lung ca or metastatic involvement of the lungs. The two small nodules described on the prior dictated report have resolved in the left superior segment and most likely were postinflammatory. Diffuse centrilobular emphysema. 10/13/17 PET scan: The new left upper lobe nodule is FDG avid consistent with malignancy. Recommend tissue confirmation. The new nodule in the  right upper lobe demonstrates low level uptake. While the level of uptake suggests the possibility of an inflammatory or infectious process, a low-grade malignancy is not excluded. Given the suspected malignancy on the left, tissue confirmation should be considered on the right despite the low level of uptake. No distant metastatic disease identified 10/27/17 ENB ([place]): Transbronchial Fine Needle Aspirations 21G X7, Transbronchial Forceps Biopsy X6. ALVEOLATED LUNG TISSUE WITH HEMOSIDERIN LADEN MACROPHAGES.  NEGATIVE FOR ATYPIA AND MALIGNANCY 11/01/17 PFTs: FVC: 2.96 > 3.14 L (68 > 72 %pred), FEV1: 0.94 > 0.93 L (27 %pred), FEV1/FVC: 32%, TLC: 9.04 L (137 %pred), DLCO 62 %pred 12/07/17 CTA chest: No pulmonary embolus identified. Anterior left upper lobe ground-glass and nodular consolidation likely representing pneumonitis. Stable bilateral upper lobe pulmonary nodules. Stable emphysema. 01/21/18 CT chest: new masslike architectural distortion and ground-glass attenuation surrounding the index nodule within the RUL. The appearance favors an inflammatory or infectious process. The index nodule in the left upper lobe is slightly decreased in volume when compared with the previous exam favoring a benign process. Attention on follow-up imaging advise. Advanced changes of emphysema (very severe) 06/01/18 CTA chest: No evidence of pulmonary embolism. Waxing and waning bilateral pulmonary nodular process as described. New 8 mm nodular ill-defined focus of opacification over the lingula which may represent an acute inflammatory or infectious process. No effusion. Emphysema 10/19/18 CTA chest: No acute cardiopulmonary disease and no evidence of pulm embolism. Hospitalization 1/12-1/14/2020: COPD exacerbation Hospitalization 2/22-2/27/20: COPD exacerbation, HCAP 02/11/19 CT chest: Progressed masslike consolidation within the RUL in the region of previous waxing and waning nodule, this is contiguous with the pleural  surface and demonstrates spiculated margin and probable small focus  of central cavitation. Findings could be secondary to infection superimposed on chronic nodule. However cavitary neoplasm is also a concern. Multiple additional lung nodules which have not significantly changed. Small foci of ground-glass density in the RUL may reflect small focus of infection. Mod-severe emphysema 05/01/19 CT chest: Waxing/waning right lung opacities, suggesting infection, including atypical/mycobacterial infection. Improvement of the prior dominant masslike opacity argues against neoplasm. Stable left upper lobe nodule, likely benign. Aortic Atherosclerosis and Emphysema 11/02/2019 CT chest: Multiple larger pulmonary nodules and masslike areas seen on the prior study have largely regressed.  Findings indicative of infectious/inflammatory process.  Diffuse bronchial wall thickening with moderate centrilobular and paraseptal emphysema.  Suggestion of left main and three-vessel coronary artery disease. 04/23/2020 CT chest: "Multiple bilateral stable pulmonary nodules. New 7 mm nodule over the posterior left upper lobe. Stable nodularity over an area ofright upper lobe/apical scarring with new 9 mm nodular focus along the most inferior aspect of this scarring. This area has shown to wax and wane on previous exams and is most likely inflammatory". 07/24/2020 CT chest: "Background of marked pulmonary emphysema with stable post infectious or inflammatory changes and with near complete resolution of a presumably infectious or inflammatory nodule in the LEFT upper lobe as compared to previous exam". 01/21/2021 CT chest: Severe emphysematous changes, pulmonary scarring, bilateral calcified granulomas. 01/29/2021 PFTs: Further decline in lung function.  FEV1 0.74 L or 22% predicted, FVC 1.91 L or 42% predicted.  No significant bronchodilator response.  FEV1/FVC 39%.  Patient could not perform lung volumes. Diffusion capacity severely  reduced.  This is consistent with very severe COPD. 02/13/2021 2D echo: LVEF 40 to 45% global hypokinesis, severe hypokinesis of the periapical region.  Grade II diastolic dysfunction, dilated left atrium. 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  Biopsies high-grade dysplastic. 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 05/09/2022 PET/CT: Intense tracer uptake associated with the distal esophageal mass. 06/03/2022 chemotherapy-XRT: Patient began concurrent carboplatin/Taxol/XRT. 07/26/2022 completed XRT: Completing chemotherapy. 09/02/2022 PET/CT: Distal esophageal wall thickening with significant reduction in abnormal FDG activity, small focus of minimally metabolic activity, possibly reflecting posttreatment change.  No evidence of hypermetabolic metastatic disease. 09/09/2022 echocardiogram: LVEF 50 to 55%, grade 1 DD, normal RV function, 10/11/2022 chest x-ray: Hyperinflation of the lungs no acute cardiopulmonary disease.  Granuloma of the LEFT upper lobe. 12/06/2022 PET/CT: No evidence of local recurrence or metastatic disease after treatment for esophageal cancer.  Stable bilateral adrenal adenomas. 01/16/2023 chest x-ray: Changes consistent with COPD with no active disease. 01/26/2023 barium swallow: Moderate esophageal dysmotility, markedly delayed passage of barium tablet at the level of the GE junction passing after approximately 20 minutes.   INTERVAL: Last seen by  me on 19 January 2023.  At that time he had had ED visit the days prior for burning chest pain.  Lansoprazole started empirically for GERD and barium swallow ordered.  Since his last visit here chest pain has subsided.  HPI The patient is a 72 year old former smoker (quit 2020, 126 PY) with severe stage IV COPD and chronic hypoxic respiratory failure on supplemental oxygen, who presents here for follow-up on these  issues.  This is a scheduled visit.  Since his last visit he has not had ED visits.  He did have a visit to the urgent care on 18 February for a minor COPD exacerbation.  He was treated with prednisone, Augmentin and doxycycline.  At his prior visit he was noted  to have significant chest discomfort.  He has responded to management of GERD with PPI.  He has not had any fevers, chills or sweats.  Sputum has not been overly discolored.  No hemoptysis.  No nausea, vomiting or hematemesis.  He is maintained on DuoNebs and budesonide via nebulizer as he does not have breath-holding capacity for proper use of DPI/MDI.  He initially was on lansoprazole twice a day to reduce it to once daily due to abdominal cramping.  Since he decreased to once a day he has noted marked improvement on his symptoms.  His respiratory care is complicated by ischemic cardiomyopathy most recent LVEF 50 to 55% and grade II diastolic dysfunction giving him chronic combined systolic and diastolic heart failure.  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  He completed these and August 2023.  He is on continued CT surveillance every 3 months.  He also has a history of prostate cancer status post radiation in the past.  Most recently he has been plagued with worsening issues with anxiety and depression to have been a problem for him of longstanding.  He is working with his psychiatrist to try to get these issues under control.   Review of Systems A 10 point review of systems was performed and it is as noted above otherwise negative.  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   Anemia due to antineoplastic chemotherapy 06/26/2022   Constipation 06/19/2022   Encounter for antineoplastic chemotherapy 05/31/2022   Weight loss 05/31/2022   Dysphagia 05/31/2022   Primary esophageal  squamous cell carcinoma ([place]) 05/04/2022   COPD exacerbation ([place]) 07/31/2021   Prostate cancer ([place]) 10/11/2020   Elevated PSA 07/28/2020   Iron deficiency anemia 05/20/2020   Goals of care, counseling/discussion    Palliative care by specialist    Healthcare-associated pneumonia 01/28/2019   COPD with acute exacerbation ([place]) 12/31/2018   Generalized anxiety disorder 11/12/2018   Moderate episode of recurrent major depressive disorder ([place]) 11/12/2018   Chronic respiratory failure with hypoxia ([place]) 09/29/2018   Acute respiratory failure with hypoxia and hypercapnia ([place]) 09/13/2018   Acute on chronic respiratory failure with hypoxia and hypercapnia (HCC) 08/24/2018   Atypical chest pain 07/02/2018   Grief reaction 01/13/2018   COPD with hypoxia ([place]) 12/25/2017   Unstable angina (HCC)    Benign neoplasm of ascending colon    Benign neoplasm of descending colon    Benign neoplasm of sigmoid colon    History of colonic polyps    Pulmonary nodules/lesions, multiple 01/05/2015   Bruit of left carotid artery 11/04/2014   Sleep disorder 07/18/2013   Seasonal and perennial allergic rhinitis 05/29/2013   Bradycardia 04/20/2011   SMOKER 07/30/2010   CAD, NATIVE VESSEL 04/23/2010   Hyperlipidemia 03/24/2010   DEPRESSION/ANXIETY 03/24/2010   Chronic heart failure with preserved ejection fraction (HFpEF) ([place]) 03/24/2010   COPD, very severe ([place]) 03/24/2010   GERD 03/24/2010   Essential hypertension 11/21/2009   Social History   Tobacco Use   Smoking status: Former    Packs/day: 3.00    Years: 42.00    Total pack years: 126.00    Types: Cigarettes    Quit date: 02/10/2019    Years since quitting: 4.0   Smokeless tobacco: Never   Tobacco comments:    25 months ago most smoked 4 packs a day .  Substance Use  Topics   Alcohol use: No    Alcohol/week: 0.0 standard drinks of alcohol   Allergies  Allergen Reactions   Prozac [Fluoxetine Hcl] Shortness Of Breath   Hydroxyzine     Bupropion Other (See Comments)    "Makes me feel funny" Other reaction(s): Other (See Comments) Unknown/"made me feel real funny" "Makes me feel funny" "Makes me feel funny" Other reaction(s): Other (See Comments) Unknown/"made me feel real funny" "Makes me feel funny"   Effexor Xr [Venlafaxine Hcl Er] Other (See Comments)    "Makes me feel funny"   Current Meds  Medication Sig   albuterol (VENTOLIN HFA) 108 (90 Base) MCG/ACT inhaler TAKE 2 PUFFS BY MOUTH EVERY 6 HOURS AS NEEDED FOR WHEEZE OR SHORTNESS OF BREATH   aspirin 81 MG tablet Take 81 mg by mouth daily.   atorvastatin (LIPITOR) 80 MG tablet Take 1 tablet (80 mg total) by mouth daily.   budesonide (PULMICORT) 0.5 MG/2ML nebulizer solution Take 2 mLs (0.5 mg total) by nebulization in the morning and at bedtime.   docusate sodium (COLACE) 100 MG capsule Take 1 capsule (100 mg total) by mouth 2 (two) times daily.   fluticasone (FLONASE) 50 MCG/ACT nasal spray Place 2 sprays into both nostrils daily.   furosemide (LASIX) 20 MG tablet Take 1 tablet (20 mg total) by mouth daily.   ipratropium-albuterol (DUONEB) 0.5-2.5 (3) MG/3ML SOLN Take 3 mLs by nebulization every 6 (six) hours as needed.   isosorbide mononitrate (IMDUR) 30 MG 24 hr tablet Take 1 tablet (30 mg total) by mouth daily.   ketoconazole (NIZORAL) 2 % cream Apply topically 2 (two) times daily.   lansoprazole (PREVACID) 30 MG capsule Take 1 capsule (30 mg total) by mouth 2 (two) times daily before a meal.   loratadine (CLARITIN) 10 MG tablet Take 10 mg by mouth daily.   LORazepam (ATIVAN) 1 MG tablet Take 1 tablet (1 mg total) by mouth 3 (three) times daily as needed for anxiety.   losartan (COZAAR) 25 MG tablet Take 1 tablet (25 mg total) by mouth daily.   mirtazapine (REMERON) 45 MG tablet Take 45 mg by mouth at bedtime.   Multiple Vitamin (MULTIVITAMIN) capsule Take 1 capsule by mouth daily.   OXYGEN Inhale 2.5 L into the lungs daily.   polyethylene glycol (MIRALAX) 17 g  packet Take 17 g by mouth daily as needed.   Spacer/Aero-Holding Chambers (AEROCHAMBER MV) inhaler Use as instructed   Immunization History  Administered Date(s) Administered   Fluad Quad(high Dose 65+) 11/16/2021, 11/05/2022   Influenza Whole 09/19/2012   Influenza, High Dose Seasonal PF 09/28/2019   Influenza,inj,Quad PF,6+ Mos 10/24/2013, 11/25/2014, 03/02/2016, 10/27/2016, 10/05/2017   Influenza-Unspecified 10/24/2013, 11/25/2014, 03/02/2016, 10/27/2016, 10/05/2017, 09/18/2018, 09/28/2019, 09/30/2020   Moderna Sars-Covid-2 Vaccination 01/26/2020, 02/23/2020   PFIZER Comirnaty(Gray Top)Covid-19 Tri-Sucrose Vaccine 12/15/2020, 07/15/2021   PFIZER(Purple Top)SARS-COV-2 Vaccination 01/26/2020, 02/23/2020   PNEUMOCOCCAL CONJUGATE-20 03/22/2022   Pfizer Covid-19 Vaccine Bivalent Booster 76yr & up 01/14/2022, 05/25/2022   Pneumococcal Conjugate-13 11/25/2014   Pneumococcal Polysaccharide-23 08/24/2016, 10/27/2016   Pneumococcal-Unspecified 12/20/2008   Tdap 12/20/2008       Objective:   Physical Exam BP 110/70 (BP Location: Left Arm, Cuff Size: Normal)   Pulse (!) 104   Temp 97.8 F (36.6 C)   Ht '5\' 9"'$  (1.753 m)   Wt 197 lb 6.4 oz (89.5 kg)   SpO2 95%   BMI 29.15 kg/m   SpO2: 95 % O2 Device: Nasal cannula O2 Flow Rate (L/min): 3 L/min O2  Type: Pulse O2  GENERAL: Well-developed well-nourished gentleman, chronic use of accessories, no conversational dyspnea. No acute distress.  Presents in transport chair due to dyspnea. Using oxygen via portable concentrator at 2 L pulsed. HEAD: Normocephalic, atraumatic. EYES: Pupils equal, round, reactive to light.  No scleral icterus. MOUTH: Wears dentures. NECK: Supple. No thyromegaly. No nodules. No JVD.  Trachea midline, no crepitus. PULMONARY: Very distant breath sounds, no adventitious sounds. CARDIOVASCULAR: S1 and S2. Regular rate and rhythm.  No rubs murmurs or gallops heard. GASTROINTESTINAL: Slightly protuberant abdomen, soft,  benign. MUSCULOSKELETAL: No joint deformity, no clubbing, no edema.  Mild costochondral junction tenderness on the left. NEUROLOGIC: Awake, alert, oriented.  No overt focal deficits.  Speech is fluent. SKIN: Intact,warm,dry.  Limited exam: No rashes. PSYCH: Normal mood and behavior.         Assessment & Plan:     ICD-10-CM   1. Stage 4 very severe COPD by GOLD classification ([place])  J44.9 AMB referral to pulmonary rehabilitation   Continue DuoNeb 4 times a day Continue Pulmicort twice a day Continue as needed albuterol    2. Chronic hypoxemic respiratory failure (HCC)  J96.11    Patient compliant with oxygen at 2 L/min    3. Gastroesophageal reflux disease, unspecified whether esophagitis present  K21.9    Continue lansoprazole Encourage patient to make appointment with GI for follow-up EGD    4. Former smoker  Z87.891    No evidence of relapse     Orders Placed This Encounter  Procedures   AMB referral to pulmonary rehabilitation    Referral Priority:   Routine    Referral Type:   Consultation    Number of Visits Requested:   1   Meds ordered this encounter  Medications   azithromycin (ZITHROMAX) 250 MG tablet    Sig: Take 2 tablets (500 mg) on  Day 1,  followed by 1 tablet (250 mg) once daily on Days 2 through 5.    Dispense:  6 each    Refill:  0   Discussed potential enrollment in pulmonary rehabilitation again.  Patient is concerned about frequent exacerbations have sent in a prescription for azithromycin to have on hand and start taking if his sputum changes color.  He is also to notify us if he needs to start taking it.  We will see him in follow-up in 3 months time he is to contact us prior to that time should any new difficulties arise.  Renold Don, MD Advanced Bronchoscopy PCCM [facility]    *This note was dictated using voice recognition software/Dragon.  Despite best efforts to proofread, errors can occur which can change the  meaning. Any transcriptional errors that result from this process are unintentional and may not be fully corrected at the time of dictation.

## 2023-03-01 NOTE — Patient Instructions (Signed)
We have sent a referral to pulmonary rehab.  I sent in a prescription for a Z-Pak in case your sputum changes color then you can start taking it.  We will see you in follow-up in 3 months time call sooner should any new problems arise.

## 2023-03-11 ENCOUNTER — Ambulatory Visit
Admission: RE | Admit: 2023-03-11 | Discharge: 2023-03-11 | Disposition: A | Discharge status: Home / Self Care | Payer: Medicare Other | Source: Ambulatory Visit | Attending: Oncology | Admitting: Oncology

## 2023-03-11 DIAGNOSIS — C159 Malignant neoplasm of esophagus, unspecified: Secondary | ICD-10-CM | POA: Diagnosis not present

## 2023-03-11 MED ORDER — IOHEXOL 300 MG/ML  SOLN
100.0000 mL | Freq: Once | INTRAMUSCULAR | Status: AC | PRN
  Administered 2023-03-11: 100 mL via INTRAVENOUS

## 2023-03-14 ENCOUNTER — Telehealth: Payer: Self-pay | Admitting: Pulmonary Disease

## 2023-03-14 MED ORDER — ALBUTEROL SULFATE HFA 108 (90 BASE) MCG/ACT IN AERS: 2.0000 | INHALATION_SPRAY | Freq: Four times a day (QID) | RESPIRATORY_TRACT | 2 refills | Status: DC | PRN

## 2023-03-14 NOTE — Telephone Encounter (Signed)
Pt calling for a Albuterol refill. He usually gets the kind in a grey cover. Pharm wants to give him one with a red cover. When he protested they said he'd need Dr. to call in a new RX for the grey covered Albuterol.   [facility] [place]  PT's # [phone]

## 2023-03-14 NOTE — Telephone Encounter (Signed)
Called and spoke to patient. He is requesting ventolin Rx. He received proair from pharmacy, however he feels that it is not difficult to use then ventolin.  Ventolin sent to preferred pharmacy. Nothing further needed.

## 2023-03-16 ENCOUNTER — Inpatient Hospital Stay: Payer: Medicare Other | Attending: Oncology

## 2023-03-16 ENCOUNTER — Inpatient Hospital Stay: Payer: Medicare Other

## 2023-03-16 DIAGNOSIS — C159 Malignant neoplasm of esophagus, unspecified: Secondary | ICD-10-CM | POA: Insufficient documentation

## 2023-03-16 DIAGNOSIS — C61 Malignant neoplasm of prostate: Secondary | ICD-10-CM | POA: Insufficient documentation

## 2023-03-16 LAB — CBC WITH DIFFERENTIAL/PLATELET
Abs Immature Granulocytes: 0.12 10*3/uL — ABNORMAL HIGH (ref 0.00–0.07)
Basophils Absolute: 0.1 10*3/uL (ref 0.0–0.1)
Basophils Relative: 1 %
Eosinophils Absolute: 0.1 10*3/uL (ref 0.0–0.5)
Eosinophils Relative: 2 %
HCT: 40 % (ref 39.0–52.0)
Hemoglobin: 12.9 g/dL — ABNORMAL LOW (ref 13.0–17.0)
Immature Granulocytes: 2 %
Lymphocytes Relative: 13 %
Lymphs Abs: 0.8 10*3/uL (ref 0.7–4.0)
MCH: 30.6 pg (ref 26.0–34.0)
MCHC: 32.3 g/dL (ref 30.0–36.0)
MCV: 94.8 fL (ref 80.0–100.0)
Monocytes Absolute: 0.9 10*3/uL (ref 0.1–1.0)
Monocytes Relative: 15 %
Neutro Abs: 3.9 10*3/uL (ref 1.7–7.7)
Neutrophils Relative %: 67 %
Platelets: 193 10*3/uL (ref 150–400)
RBC: 4.22 MIL/uL (ref 4.22–5.81)
RDW: 12.8 % (ref 11.5–15.5)
WBC: 5.8 10*3/uL (ref 4.0–10.5)
nRBC: 0 % (ref 0.0–0.2)

## 2023-03-16 LAB — COMPREHENSIVE METABOLIC PANEL
ALT: 35 U/L (ref 0–44)
AST: 30 U/L (ref 15–41)
Albumin: 4.1 g/dL (ref 3.5–5.0)
Alkaline Phosphatase: 64 U/L (ref 38–126)
Anion gap: 8 (ref 5–15)
BUN: 15 mg/dL (ref 8–23)
CO2: 28 mmol/L (ref 22–32)
Calcium: 8.7 mg/dL — ABNORMAL LOW (ref 8.9–10.3)
Chloride: 102 mmol/L (ref 98–111)
Creatinine, Ser: 0.87 mg/dL (ref 0.61–1.24)
GFR, Estimated: >60 mL/min (ref >60)
Glucose, Bld: 121 mg/dL — ABNORMAL HIGH (ref 70–99)
Potassium: 3.5 mmol/L (ref 3.5–5.1)
Sodium: 138 mmol/L (ref 135–145)
Total Bilirubin: 0.6 mg/dL (ref 0.3–1.2)
Total Protein: 6.9 g/dL (ref 6.5–8.1)

## 2023-03-18 ENCOUNTER — Emergency Department
Admission: EM | Admit: 2023-03-18 | Discharge: 2023-03-18 | Disposition: A | Discharge status: Home / Self Care | Payer: Medicare Other | Attending: Emergency Medicine | Admitting: Emergency Medicine

## 2023-03-18 ENCOUNTER — Emergency Department: Payer: Medicare Other

## 2023-03-18 ENCOUNTER — Other Ambulatory Visit: Payer: Self-pay

## 2023-03-18 ENCOUNTER — Ambulatory Visit: Payer: Medicare Other | Admitting: Cardiology

## 2023-03-18 DIAGNOSIS — R0609 Other forms of dyspnea: Secondary | ICD-10-CM

## 2023-03-18 DIAGNOSIS — Z955 Presence of coronary angioplasty implant and graft: Secondary | ICD-10-CM | POA: Insufficient documentation

## 2023-03-18 DIAGNOSIS — I2510 Atherosclerotic heart disease of native coronary artery without angina pectoris: Secondary | ICD-10-CM | POA: Insufficient documentation

## 2023-03-18 DIAGNOSIS — I5032 Chronic diastolic (congestive) heart failure: Secondary | ICD-10-CM | POA: Diagnosis not present

## 2023-03-18 DIAGNOSIS — R0602 Shortness of breath: Secondary | ICD-10-CM

## 2023-03-18 DIAGNOSIS — J449 Chronic obstructive pulmonary disease, unspecified: Secondary | ICD-10-CM | POA: Insufficient documentation

## 2023-03-18 DIAGNOSIS — R079 Chest pain, unspecified: Secondary | ICD-10-CM | POA: Diagnosis present

## 2023-03-18 LAB — CBC
HCT: 41.3 % (ref 39.0–52.0)
Hemoglobin: 13.1 g/dL (ref 13.0–17.0)
MCH: 30.3 pg (ref 26.0–34.0)
MCHC: 31.7 g/dL (ref 30.0–36.0)
MCV: 95.6 fL (ref 80.0–100.0)
Platelets: 214 10*3/uL (ref 150–400)
RBC: 4.32 MIL/uL (ref 4.22–5.81)
RDW: 12.9 % (ref 11.5–15.5)
WBC: 9.7 10*3/uL (ref 4.0–10.5)
nRBC: 0 % (ref 0.0–0.2)

## 2023-03-18 LAB — TROPONIN I (HIGH SENSITIVITY)
Troponin I (High Sensitivity): 14 ng/L (ref <18)
Troponin I (High Sensitivity): 11 ng/L (ref <18)

## 2023-03-18 LAB — BASIC METABOLIC PANEL
Anion gap: 11 (ref 5–15)
BUN: 18 mg/dL (ref 8–23)
CO2: 28 mmol/L (ref 22–32)
Calcium: 9.6 mg/dL (ref 8.9–10.3)
Chloride: 102 mmol/L (ref 98–111)
Creatinine, Ser: 0.94 mg/dL (ref 0.61–1.24)
GFR, Estimated: >60 mL/min (ref >60)
Glucose, Bld: 172 mg/dL — ABNORMAL HIGH (ref 70–99)
Potassium: 3.3 mmol/L — ABNORMAL LOW (ref 3.5–5.1)
Sodium: 141 mmol/L (ref 135–145)

## 2023-03-18 LAB — BRAIN NATRIURETIC PEPTIDE: B Natriuretic Peptide: 58.6 pg/mL (ref 0.0–100.0)

## 2023-03-18 MED ORDER — FUROSEMIDE 20 MG PO TABS: 20.0000 mg | ORAL_TABLET | Freq: Two times a day (BID) | ORAL | 0 refills | Status: DC

## 2023-03-18 NOTE — ED Triage Notes (Signed)
Pt to ED via [facility] from home. Pt reports SOB and CP. Pt was seen on Wed and PCP and placed on prednsione and levaquin and was referred to ER but did not come. 125mg  solumedrol and breathing tx. 20g LAC. Pt has gained 8lbs in short time frame with no hx CHF. Pt on 3L [place] chronically.   EMS VS:  BP 127/53 98% on 3L [place] Wheezing in all fields CBG 234 RR 16  ET 35

## 2023-03-18 NOTE — ED Provider Notes (Signed)
[facility] Provider Note   Event Date/Time   First MD Initiated Contact with Patient 03/18/23 1116     (approximate) History  Shortness of Breath  HPI Leslie Sims is a 72 y.o. male with a stated past medical history of CAD with 2 stents and COPD on chronic 3 L oxygen nasal cannula who presents complaining of worsening left-sided chest pain and dyspnea on exertion.  Patient states that he is currently being treated with steroids and Levaquin for an assumed COPD exacerbation.  Patient states he is more concerned about this left-sided chest pain that he states is very dull 5/10 pain that does not radiate and is somewhat worsened with exertion. ROS: Patient currently denies any vision changes, tinnitus, difficulty speaking, facial droop, sore throat, abdominal pain, nausea/vomiting/diarrhea, dysuria, or weakness/numbness/paresthesias in any extremity   Physical Exam  Triage Vital Signs: ED Triage Vitals  Enc Vitals Group     BP 03/18/23 1101 123/79     Pulse Rate 03/18/23 1101 93     Resp 03/18/23 1101 18     Temp 03/18/23 1101 98.9 F (37.2 C)     Temp Source 03/18/23 1101 Oral     SpO2 03/18/23 1101 95 %     Weight 03/18/23 1101 201 lb (91.2 kg)     Height 03/18/23 1101 5\' 10"  (1.778 m)     Head Circumference --      Peak Flow --      Pain Score 03/18/23 1054 5     Pain Loc --      Pain Edu? --      Excl. in [place]? --    Most recent vital signs: Vitals:   03/18/23 1101 03/18/23 1200  BP: 123/79 122/64  Pulse: 93 90  Resp: 18 12  Temp: 98.9 F (37.2 C)   SpO2: 95% 97%   General: Awake, oriented x4. CV:  Good peripheral perfusion.  Resp:  Normal effort.  Expiratory wheezing over bilateral lung fields Abd:  No distention.  Other:  Elderly overweight Caucasian male laying in bed in no acute distress ED Results / Procedures / Treatments  Labs (all labs ordered are listed, but only abnormal results are displayed) Labs Reviewed  BASIC  METABOLIC PANEL - Abnormal; Notable for the following components:      Result Value   Potassium 3.3 (*)    Glucose, Bld 172 (*)    All other components within normal limits  CBC  BRAIN NATRIURETIC PEPTIDE  TROPONIN I (HIGH SENSITIVITY)  TROPONIN I (HIGH SENSITIVITY)   EKG ED ECG REPORT I, Naaman Plummer, the attending physician, personally viewed and interpreted this ECG. Date: 03/18/2023 EKG Time: 1106 Rate: 95 Rhythm: normal sinus rhythm QRS Axis: normal Intervals: normal ST/T Wave abnormalities: normal Narrative Interpretation: no evidence of acute ischemia RADIOLOGY ED MD interpretation: 2 view chest x-ray interpreted by me shows no evidence of acute abnormalities including no pneumonia, pneumothorax, or widened mediastinum -Agree with radiology assessment Official radiology report(s): DG Chest 2 View  Result Date: 03/18/2023 CLINICAL DATA:  Chest pain. EXAM: CHEST - 2 VIEW COMPARISON:  Chest CT 03/11/2023. FINDINGS: Unchanged calcified granuloma in the left upper lobe. No new airspace opacity. Stable cardiac and mediastinal contours. No pleural effusion or pneumothorax. IMPRESSION: No evidence of acute cardiopulmonary disease. Electronically Signed   By: Emmit Alexanders M.D.   On: 03/18/2023 11:29   PROCEDURES: Critical Care performed: No .1-3 Lead EKG Interpretation  Performed by: Naaman Plummer,  MD Authorized by: Naaman Plummer, MD     Interpretation: normal     ECG rate:  71   ECG rate assessment: normal     Rhythm: sinus rhythm     Ectopy: none     Conduction: normal    MEDICATIONS ORDERED IN ED: Medications - No data to display IMPRESSION / MDM / [place] / ED COURSE  I reviewed the triage vital signs and the nursing notes.                             The patient is on the cardiac monitor to evaluate for evidence of arrhythmia and/or significant heart rate changes. Patient's presentation is most consistent with acute presentation with potential  threat to life or bodily function. The patient appears to be suffering from a moderate exacerbation of COPD.  Based on the history, exam, CXR/EKG, and further workup I dont suspect any other emergent cause of this presentation, such as pneumonia, acute coronary syndrome, congestive heart failure, pulmonary embolism, or pneumothorax.  ED Interventions: bronchodilators, steroids, antibiotics, reassess  1312 Reassessment: After treatment, the patients shortness of breath is resolved, and their lung exam has returned to baseline. They are comfortable and want to go home.  Rx: Steroids, Antibiotics, Albuterol Disposition: Discharge home with SRP. PCP follow up recommended in next 48hours.   FINAL CLINICAL IMPRESSION(S) / ED DIAGNOSES   Final diagnoses:  SOB (shortness of breath)  Dyspnea on exertion  Chronic heart failure with preserved ejection fraction (HFpEF) ([name])   Rx / DC Orders   ED Discharge Orders          Ordered    furosemide (LASIX) 20 MG tablet  2 times daily        03/18/23 1313    Ambulatory referral to Cardiology       Comments: If you have not heard from the Cardiology office within the next 72 hours please call [phone].   03/18/23 1314           Note:  This document was prepared using Dragon voice recognition software and may include unintentional dictation errors.   Naaman Plummer, MD 03/18/23 1314

## 2023-03-21 ENCOUNTER — Encounter: Payer: Self-pay | Admitting: *Deleted

## 2023-03-21 ENCOUNTER — Encounter: Payer: Medicare Other | Attending: Pulmonary Disease | Admitting: *Deleted

## 2023-03-21 DIAGNOSIS — Z5189 Encounter for other specified aftercare: Secondary | ICD-10-CM | POA: Insufficient documentation

## 2023-03-21 DIAGNOSIS — J449 Chronic obstructive pulmonary disease, unspecified: Secondary | ICD-10-CM | POA: Insufficient documentation

## 2023-03-21 DIAGNOSIS — I5032 Chronic diastolic (congestive) heart failure: Secondary | ICD-10-CM | POA: Insufficient documentation

## 2023-03-21 NOTE — Progress Notes (Signed)
Virtual orientation call completed today. he has an appointment on Date: 03/28/2023  for EP eval and gym Orientation.  Documentation of diagnosis can be found in [facility] Date: 03/01/2023 .

## 2023-03-23 ENCOUNTER — Encounter: Payer: Self-pay | Admitting: Oncology

## 2023-03-23 ENCOUNTER — Inpatient Hospital Stay: Payer: Medicare Other | Attending: Oncology | Admitting: Oncology

## 2023-03-23 VITALS — BP 114/63 | HR 96 | Temp 96.9°F | Resp 18 | Wt 197.3 lb

## 2023-03-23 DIAGNOSIS — Z923 Personal history of irradiation: Secondary | ICD-10-CM | POA: Insufficient documentation

## 2023-03-23 DIAGNOSIS — Z8501 Personal history of malignant neoplasm of esophagus: Secondary | ICD-10-CM | POA: Insufficient documentation

## 2023-03-23 DIAGNOSIS — C159 Malignant neoplasm of esophagus, unspecified: Secondary | ICD-10-CM

## 2023-03-23 DIAGNOSIS — C61 Malignant neoplasm of prostate: Secondary | ICD-10-CM

## 2023-03-23 DIAGNOSIS — J9610 Chronic respiratory failure, unspecified whether with hypoxia or hypercapnia: Secondary | ICD-10-CM | POA: Insufficient documentation

## 2023-03-23 DIAGNOSIS — Z9221 Personal history of antineoplastic chemotherapy: Secondary | ICD-10-CM | POA: Insufficient documentation

## 2023-03-23 DIAGNOSIS — Z8546 Personal history of malignant neoplasm of prostate: Secondary | ICD-10-CM | POA: Insufficient documentation

## 2023-03-23 DIAGNOSIS — Z9981 Dependence on supplemental oxygen: Secondary | ICD-10-CM | POA: Diagnosis not present

## 2023-03-23 NOTE — Assessment & Plan Note (Addendum)
Gleason score 3+4, status post definitive radiation. Has not followed up with urology Dr. Diamantina Providence.   Check PSA at next visit.

## 2023-03-23 NOTE — Assessment & Plan Note (Addendum)
He is on concurrent chemotherapy carboplatin/Taxol weekly with radiation. S/p concurrent chemotherapy and radiation [finished 07/26/22] Repeat EGD showed no evidence of malignancy. PET restaging- CR Recommend continued surveillance CT scan, next in 4 months

## 2023-03-23 NOTE — Progress Notes (Signed)
Hematology/Oncology Progress note [phone] [phone]     Clinic Day:  03/23/2023   Referring physician: [name], MD  Assessment & Plan:  Cancer Staging  Primary esophageal squamous cell carcinoma Staging form: Esophagus - Squamous Cell Carcinoma, AJCC 8th Edition - Clinical: Stage II (cT3, cN0, cM0, L: Lower) - Signed by [name], MD on 05/31/2022  Prostate cancer Staging form: Prostate, AJCC 8th Edition - Clinical: cT2c, cN0, cM0, Grade Group: 2 - Signed by [name], MD on 05/04/2022   Primary esophageal squamous cell carcinoma ([place]) He is on concurrent chemotherapy carboplatin/Taxol weekly with radiation. S/p concurrent chemotherapy and radiation [finished 07/26/22] Repeat EGD showed no evidence of malignancy. PET restaging- CR Recommend continued surveillance CT scan, next in 4 months   Prostate cancer ([place]) Gleason score 3+4, status post definitive radiation. Has not followed up with urology Dr. Diamantina Providence.   Check PSA at next visit.       Orders Placed This Encounter  Procedures   CT CHEST ABDOMEN PELVIS W CONTRAST    Standing Status:   Future    Standing Expiration Date:   03/22/2024    Order Specific Question:   If indicated for the ordered procedure, I authorize the administration of contrast media per Radiology protocol    Answer:   Yes    Order Specific Question:   Does the patient have a contrast media/X-ray dye allergy?    Answer:   No    Order Specific Question:   Preferred imaging location?    Answer:   [facility]    Order Specific Question:   Is Oral Contrast requested for this exam?    Answer:   Yes, Per Radiology protocol   CEA    Standing Status:   Future    Standing Expiration Date:   03/22/2024   CBC with Differential ([place] Only)    Standing Status:   Future    Standing Expiration Date:   03/22/2024   CMP ([place] only)    Standing Status:   Future    Standing Expiration Date:   03/22/2024   PSA     Standing Status:   Future    Standing Expiration Date:   03/22/2024    Follow up  4 months.  Repeat CTscan in 4 months and follow-up after.  The patient understands the plans discussed today and is in agreement with them.  He knows to contact our office if he develops concerns prior to his next appointment.   [name], MD      Chief complaints: follow up for esophageal squamous cell carcinoma  History of Present Illness  Oncology History  Prostate cancer  10/11/2020 Initial Diagnosis   Prostate cancer ([place])   05/04/2022 Cancer Staging   Staging form: Prostate, AJCC 8th Edition - Clinical: cT2c, cN0, cM0, Grade Group: 2 - Signed by [name], MD on 05/04/2022 Gleason score: 7 Histologic grading system: 5 grade system   Primary esophageal squamous cell carcinoma  04/19/2022 Procedure   04/19/2022 EGD by [name] showed partially obstructive likely malignant esophageal tumor was found at [place] junction.  Biopsied.  Normal stomach and normal examined duodenum. Biopsy pathology showed fragments of the squamous mucosa with high-grade dysplastic.  Scant gastric mucosa present with reactive changes and no dysplastic or significant intestinal metaplastic.  Fragments of fibrillar purulent ulcer diabetes also present.  04/27/2022, repeat EGD and a biopsy of GE junction partially obstructed ulcerating mass. Repeat biopsy pathology showed superficial and  disaggregated fragments of squamous mucosa with at least severe dysplastic/squamous cell carcinoma in situ.  Definite invasion cannot be identified.  In part secondary to specimen fragmentation and tangential sectioning.    04/27/2022 Imaging   MRI abdomen MRCP with and without contrast showed bilateral benign adrenal adenoma.  Small hepatic hemangioma and small right hepatic parenchymal vascular anomalies. Small hiatal hernia.  Colonic diverticulosis.   05/04/2022 Initial Diagnosis   Primary esophageal squamous cell carcinoma  + Stomach discomfort  for 4 months, dysphagia for both solids and liquids at times. + Weight loss   05/04/2022 Cancer Staging   Staging form: Esophagus - Squamous Cell Carcinoma, AJCC 8th Edition - Clinical: Stage II (cT3, cN0, cM0, L: Lower) - Signed by [name], MD on 05/31/2022 Stage prefix: Initial diagnosis Total positive nodes: 0   05/09/2022 Imaging   PET scan showed intense tracer uptake associated with the distal esophageal mass just above the level of the GE junction compatible with primary esophageal neoplasm. No signs of tracer avid nodal metastasis or solid organ metastasis.   05/20/2022 Procedure   EUS by Dr.Spaete.  T3,Nx 05/20/2022, esophagus mass biopsy showed invasive moderately differentiated squamous cell carcinoma, with basaloid features and minimal keratinization.  Background squamous cell carcinoma in situ.   06/03/2022 -  Chemotherapy   Patient is on Treatment Plan :  ESOPHAGUS Carboplatin/PACLitaxel weekly x 6 weeks with XRT       07/09/2022 - 07/12/2022 Hospital Admission   Hospitalization due to COPD exacerbation.     09/03/2022 Imaging   PET restaging  1. Similar symmetric distal esophageal wall thickening with significant reduction in the abnormal FDG avidity, now with a small focus of minimally metabolic activity, nonspecific and possibly reflecting posttreatment change or residual tumor. Suggest continued attention on follow-up imaging. 2. No evidence of hypermetabolic metastatic disease in the chest, abdomen or pelvis. 3. Aortic Atherosclerosis (ICD10-I70.0) and Emphysema (ICD10-J43.9).   11/08/2022 Procedure   Repeat EGD by [name] showed  -Non-severe radiation esophagitis with no bleeding. Biopsied.-Pathology showed mild acute esophagitis.  No evidence of Barrett mucosa or dysplasia, no evidence of atypia or malignancy. - Small hiatal hernia. - Normal stomach. - Normal examined duodenum     12/08/2022 Imaging   Repeat PET scan showed interval resolution of residual  hypermetabolism within the distal esophagus consistent with response to therapy.  No evidence of local recurrence or metastatic disease.  Stable bilateral adrenal adenomas.  Aortic atherosclerosis.     Patient has multiple comorbidities including COPD/chronic respiratory failure, history of STEMI/combined systolic and diastolic CHF [last LVEF 40 to 45%], lung nodules previously avid on PET scan, biopsy negative and was felt to be calcified granulomas.-Pulmonology Dr. Patsey Berthold Patient lives by himself.  Independent with ADL and some of the iADL  Interval History Leslie Sims is here today for repeat clinical assessment. He denies fevers or chills. He denies pain. His appetite is fair His weight is stable. + Esophageal dysphagia, improved.   stating it is "a whole lot better." Weight is stable. - Reports "a little short" of breath and he follows up with cardiologist and pulmonologist, -    Review of Systems  Constitutional:  Negative for appetite change, chills, fatigue, fever and unexpected weight change.  HENT:   Negative for hearing loss and voice change.   Eyes:  Negative for eye problems and icterus.  Respiratory:  Positive for shortness of breath. Negative for chest tightness and cough.        Chronic respiratory failure on  oxygen  Cardiovascular:  Negative for leg swelling.  Gastrointestinal:  Negative for abdominal distention, abdominal pain and constipation.  Endocrine: Negative for hot flashes.  Genitourinary:  Negative for difficulty urinating, dysuria and frequency.   Musculoskeletal:  Negative for arthralgias.  Skin:  Negative for itching and rash.  Neurological:  Negative for light-headedness and numbness.  Hematological:  Negative for adenopathy. Does not bruise/bleed easily.  Psychiatric/Behavioral:  Negative for confusion.      VITALS:  Blood pressure 114/63, pulse 96, temperature (!) 96.9 F (36.1 C), temperature source Tympanic, resp. rate 18, weight 197 lb 4.8 oz (89.5  kg), SpO2 98 %, peak flow (!) 3 L/min.  Wt Readings from Last 3 Encounters:  03/23/23 197 lb 4.8 oz (89.5 kg)  03/18/23 201 lb (91.2 kg)  03/01/23 197 lb 6.4 oz (89.5 kg)    Body mass index is 28.31 kg/m.  Performance status (ECOG): 2 - Symptomatic, <50% confined to bed  PHYSICAL EXAM:  Physical Exam Constitutional:      General: He is not in acute distress.    Appearance: He is obese. He is not diaphoretic.  HENT:     Head: Normocephalic and atraumatic.  Eyes:     General: No scleral icterus. Cardiovascular:     Rate and Rhythm: Normal rate.     Heart sounds: No murmur heard. Pulmonary:     Effort: Pulmonary effort is normal. No respiratory distress.     Comments: Patient is on nasal cannula oxygen Abdominal:     General: There is no distension.     Palpations: Abdomen is soft.  Musculoskeletal:        General: Normal range of motion.     Cervical back: Normal range of motion.  Skin:    Findings: No erythema.  Neurological:     Mental Status: He is alert and oriented to person, place, and time. Mental status is at baseline.     Motor: No abnormal muscle tone.  Psychiatric:        Mood and Affect: Mood and affect normal.      LABS:      Latest Ref Rng & Units 03/18/2023   11:10 AM 03/16/2023   10:42 AM 01/16/2023    9:31 PM  CBC  WBC 4.0 - 10.5 K/uL 9.7  5.8  6.1   Hemoglobin 13.0 - 17.0 g/dL 13.1  12.9  12.9   Hematocrit 39.0 - 52.0 % 41.3  40.0  40.3   Platelets 150 - 400 K/uL 214  193  199       Latest Ref Rng & Units 03/18/2023   11:10 AM 03/16/2023   10:42 AM 01/16/2023    9:31 PM  CMP  Glucose 70 - 99 mg/dL 172  121  114   BUN 8 - 23 mg/dL 18  15  12    Creatinine 0.61 - 1.24 mg/dL 0.94  0.87  1.02   Sodium 135 - 145 mmol/L 141  138  144   Potassium 3.5 - 5.1 mmol/L 3.3  3.5  3.6   Chloride 98 - 111 mmol/L 102  102  103   CO2 22 - 32 mmol/L 28  28  30    Calcium 8.9 - 10.3 mg/dL 9.6  8.7  9.3   Total Protein 6.5 - 8.1 g/dL  6.9  6.7   Total  Bilirubin 0.3 - 1.2 mg/dL  0.6  0.4   Alkaline Phos 38 - 126 U/L  64  55   AST 15 -  41 U/L  30  32   ALT 0 - 44 U/L  35  39      STUDIES:  DG Chest 2 View  Result Date: 03/18/2023 CLINICAL DATA:  Chest pain. EXAM: CHEST - 2 VIEW COMPARISON:  Chest CT 03/11/2023. FINDINGS: Unchanged calcified granuloma in the left upper lobe. No new airspace opacity. Stable cardiac and mediastinal contours. No pleural effusion or pneumothorax. IMPRESSION: No evidence of acute cardiopulmonary disease. Electronically Signed   By: Emmit Alexanders M.D.   On: 03/18/2023 11:29   CT CHEST ABDOMEN PELVIS W CONTRAST  Result Date: 03/14/2023 CLINICAL DATA:  Restaging squamous cell carcinoma of the esophagus. History of prostate cancer also. * Tracking Code: BO * EXAM: CT CHEST, ABDOMEN, AND PELVIS WITH CONTRAST TECHNIQUE: Multidetector CT imaging of the chest, abdomen and pelvis was performed following the standard protocol during bolus administration of intravenous contrast. RADIATION DOSE REDUCTION: This exam was performed according to the departmental dose-optimization program which includes automated exposure control, adjustment of the mA and/or kV according to patient size and/or use of iterative reconstruction technique. CONTRAST:  133mL OMNIPAQUE IOHEXOL 300 MG/ML  SOLN COMPARISON:  PET CTs 12/06/2022 and 09/02/2022 FINDINGS: CT CHEST FINDINGS Cardiovascular: The heart is normal in size. Small amount of pericardial fluid but no overt effusion. Stable aortic and coronary artery calcifications. The aorta is normal in caliber. No dissection. Pulmonary arteries are unremarkable. Mediastinum/Nodes: No mediastinal or hilar mass or lymphadenopathy. Stable mild to moderate distal esophageal wall thickening without discrete mass. No paraesophageal lymphadenopathy the. Lungs/Pleura: Stable advanced emphysematous changes pulmonary scarring. There are stable calcified granulomas in both upper lobes. No worrisome pulmonary lesions or  pulmonary nodules to suggest metastatic disease. Branching high attenuation material noted in the right lower lobe possibly related to prior barium aspiration. Musculoskeletal: No chest wall mass, supraclavicular or axillary adenopathy. Stable densely calcified right axillary node. No worrisome bone lesions to suggest metastatic disease. CT ABDOMEN PELVIS FINDINGS Hepatobiliary: No hepatic lesions or intrahepatic biliary dilatation. The gallbladder is unremarkable. No common bile duct dilatation. Pancreas: No mass, inflammation or ductal dilatation. Spleen: Normal size. Small scattered calcified granulomas are noted. Adrenals/Urinary Tract: Stable 2.5 cm left adrenal gland nodule shown to be a benign adenoma on prior MRI from 04/24/2022 similar benign adenoma measuring 12 mm in the right adrenal gland. No renal lesions, renal calculi or hydronephrosis. The bladder is unremarkable. Stomach/Bowel: The stomach, duodenum, small bowel and colon are unremarkable. No acute inflammatory process, mass lesions or obstructive findings. The terminal ileum and appendix are normal. Scattered sigmoid colon diverticulosis. Vascular/Lymphatic: Stable age advanced atherosclerotic calcification involving the aorta and iliac arteries and branch vessels but no aneurysm or dissection. No mesenteric or retroperitoneal mass or adenopathy. Specifically, no gastrohepatic ligament or celiac axis adenopathy. Reproductive: The prostate gland seminal vesicles are unremarkable. Penile prosthesis in place. Other: No pelvic mass or adenopathy. No free pelvic fluid collections. No inguinal mass or adenopathy. Right inguinal hernia containing fat. Musculoskeletal: Scoliosis and degenerative lumbar spondylosis but no worrisome lytic or sclerotic bone lesions to suggest metastatic disease. IMPRESSION: 1. Stable mild to moderate distal esophageal wall thickening without discrete mass. No paraesophageal lymphadenopathy. 2. No findings for metastatic  disease involving the chest, abdomen or pelvis. 3. Stable bilateral adrenal gland adenomas. Aortic Atherosclerosis (ICD10-I70.0) and Emphysema (ICD10-J43.9). Electronically Signed   By: Marijo Sanes M.D.   On: 03/14/2023 09:55   DG ESOPHAGUS W DOUBLE CM (HD)  Result Date: 01/26/2023 CLINICAL DATA:  Patient with history of  esophageal cancer, reports finishing treatment 3 months ago. Patient reports symptoms of reflux postradiation therapy. EXAM: ESOPHAGUS/BARIUM SWALLOW/TABLET STUDY TECHNIQUE: Combined double and single contrast examination was performed using effervescent crystals, high-density barium, and thin liquid barium. This exam was performed by Reatha Armour, PA-C , and was supervised and interpreted by Dr Albin Felling. FLUOROSCOPY: Radiation Exposure Index (as provided by the fluoroscopic device): 2.2 minutes (18 mGy) COMPARISON:  None Available. FINDINGS: Swallowing: Vestibular penetration with spontaneous clearance visualized. Pharynx: Unremarkable. Esophagus: Normal appearance. Esophageal motility: Moderate esophageal dysmotility with tertiary contractions visualized. Hiatal Hernia: None. Gastroesophageal reflux: None observed. Ingested 54mm barium tablet: Became stuck at gastroesophageal junction. Passed after approximately 20 minutes. Other: None. IMPRESSION: 1. Moderate esophageal dysmotility. 2. Markedly delayed passage of the barium tablet at the level of the GE junction, passing after approximately 20 minutes. Electronically Signed   By: Albin Felling M.D.   On: 01/26/2023 13:31   DG Chest Portable 1 View  Result Date: 01/16/2023 CLINICAL DATA:  Shortness of breath for 24 hours EXAM: PORTABLE CHEST 1 VIEW COMPARISON:  01/08/2023 FINDINGS: No change from 01/08/2023. Stable cardiomediastinal silhouette. Aortic atherosclerotic calcification. Bibasilar atelectasis/scarring. Emphysema and chronic bronchitic change. IMPRESSION: No active disease.  COPD. Electronically Signed   By: Placido Sou M.D.   On: 01/16/2023 21:41   DG Chest Port 1 View  Result Date: 01/08/2023 CLINICAL DATA:  Shortness of breath and chest pain EXAM: PORTABLE CHEST 1 VIEW COMPARISON:  12/21/2022 FINDINGS: Calcified granuloma at the left apex. Branching calcification at the right base. Apical lucency in the setting of emphysema. There is no edema, consolidation, effusion, or pneumothorax. Normal heart size and mediastinal contours. IMPRESSION: No acute finding. COPD. Electronically Signed   By: Jorje Guild M.D.   On: 01/08/2023 08:42      Past Medical History:  Diagnosis Date   Allergic rhinitis    Anxiety    Back pain    Cancer    prostate   CHF (congestive heart failure)    Colon polyps 08/2012   colonoscopy; multiple colon polyps; repeat colonoscopy in one year.   Complication of anesthesia    COPD (chronic obstructive pulmonary disease)    a. on home O2.   Coronary artery disease 11/2009   a. late presenting ant MI; b. LHC 100% pLAD s/p PCI/DES, 99% mRCA s/p PCI/DES, EF 35%; c. nuclear stress test 05/13: prior ant/inf infarcts w/o ischemia, EF 43%; d. LHC 02/14: widely patent stents with no other obs dz, EF 40%; e. LHC 11/17: LM nl, mLAD 10%, patent LAD stent, dLAD 20%, p-mRCA 10%, mRCA 40%, patent RCA stent, EF 35-45%    Depression    Diabetes    Dysphagia    Family history of adverse reaction to anesthesia    GERD (gastroesophageal reflux disease)    [id number])    Helicobacter pylori (H. pylori)    Hemorrhoids    Hernia    inguinal   HFrEF (heart failure with reduced ejection fraction)    a. 10/2016 LV gram: EF 35-45%; b. 06/2018 Echo: EF 40-45%. c. 01/2021 Echo: EF 40-45%   Hyperlipidemia    Hypertension    Insomnia    Ischemic cardiomyopathy    a. 10/2016 LV gram: EF 35-45%; b. 06/2018 Echo: EF 40-45%.   Myalgia    Oxygen dependent    Peptic ulcer    Pulmonary nodules    ST elevation (STEMI) myocardial infarction involving left anterior descending coronary artery  11/2009   a. s/p PCI  to the LAD   Status post dilation of esophageal narrowing 2000   Thickening of esophagus    Tinea pedis    Wears dentures    partial upper    Past Surgical History:  Procedure Laterality Date    coronary stents     Admission  12/20/2012   COPD exacerbation.  [place].   CARDIAC CATHETERIZATION     CARDIAC CATHETERIZATION  02/05/2013   [facility]   CARDIAC CATHETERIZATION  09/19/2013   [facility] : patent stents with no change in anatomy. EF: 40$   CARDIAC CATHETERIZATION Left 11/12/2016   Procedure: Left Heart Cath and Coronary Angiography;  Surgeon: Wellington Hampshire, MD;  Location: [place];  Service: Cardiovascular;  Laterality: Left;   COLONOSCOPY     COLONOSCOPY WITH PROPOFOL N/A 05/18/2016   Procedure: COLONOSCOPY WITH PROPOFOL;  Surgeon: Lucilla Lame, MD;  Location: [facility] ENDOSCOPY;  Service: Endoscopy;  Laterality: N/A;   COLONOSCOPY WITH PROPOFOL N/A 03/19/2020   Procedure: COLONOSCOPY WITH PROPOFOL;  Surgeon: Lin Landsman, MD;  Location: [facility] ENDOSCOPY;  Service: Gastroenterology;  Laterality: N/A;   CORONARY ANGIOPLASTY WITH STENT PLACEMENT  09/19/2009   LAD 3.0 X23 mm Xience DES, RCA: 4.0 X 15 mm Xience DES   ELECTROMAGNETIC NAVIGATION BROCHOSCOPY N/A 10/27/2017   Procedure: ELECTROMAGNETIC NAVIGATION BRONCHOSCOPY;  Surgeon: Flora Lipps, MD;  Location: [facility] ORS;  Service: Cardiopulmonary;  Laterality: N/A;   ESOPHAGEAL DILATION     ESOPHAGOGASTRODUODENOSCOPY  12/20/2006   ESOPHAGOGASTRODUODENOSCOPY  08/20/2012   ESOPHAGOGASTRODUODENOSCOPY (EGD) WITH PROPOFOL N/A 04/19/2022   Procedure: ESOPHAGOGASTRODUODENOSCOPY (EGD) WITH PROPOFOL;  Surgeon: [name], MD;  Location: [facility] ENDOSCOPY;  Service: Endoscopy;  Laterality: N/A;   ESOPHAGOGASTRODUODENOSCOPY (EGD) WITH PROPOFOL N/A 04/27/2022   Procedure: ESOPHAGOGASTRODUODENOSCOPY (EGD) WITH PROPOFOL;  Surgeon: [name], MD;  Location: [facility] ENDOSCOPY;  Service: Endoscopy;   Laterality: N/A;   ESOPHAGOGASTRODUODENOSCOPY (EGD) WITH PROPOFOL N/A 11/08/2022   Procedure: ESOPHAGOGASTRODUODENOSCOPY (EGD) WITH PROPOFOL;  Surgeon: [name], MD;  Location: [facility] ENDOSCOPY;  Service: Endoscopy;  Laterality: N/A;   EUS N/A 05/20/2022   Procedure: UPPER ENDOSCOPIC ULTRASOUND (EUS) LINEAR;  Surgeon: Reita Cliche, MD;  Location: [facility] ENDOSCOPY;  Service: Gastroenterology;  Laterality: N/A;  [facility]   HERNIA REPAIR  07/20/2012   L inguinal hernia repair   PENILE PROSTHESIS IMPLANT      Family History  Problem Relation Age of Onset   Heart attack Brother        Brother #1   Diabetes Brother    Hypertension Brother        #3   Coronary artery disease Father 7       deceased   Heart attack Father    Diabetes Father    Heart disease Father    COPD Mother 32       deceased   Alcohol abuse Sister        polysubstance abuse   COPD Sister    Lung cancer Sister    Alcohol abuse Sister        polysubstance abuse   Penile cancer Brother    Diabetes Brother    Prostate cancer Neg Hx    Bladder Cancer Neg Hx    Kidney cancer Neg Hx     Social History:  reports that he quit smoking about 4 years ago. His smoking use included cigarettes. He has a 126.00 pack-year smoking history. He has never used smokeless tobacco. He reports that he does not drink alcohol and  does not use drugs.  Allergies:  Allergies  Allergen Reactions   Prozac [Fluoxetine Hcl] Shortness Of Breath   Hydroxyzine    Bupropion Other (See Comments)    "Makes me feel funny" Other reaction(s): Other (See Comments) Unknown/"made me feel real funny" "Makes me feel funny" "Makes me feel funny" Other reaction(s): Other (See Comments) Unknown/"made me feel real funny" "Makes me feel funny"   Effexor Xr [Venlafaxine Hcl Er] Other (See Comments)    "Makes me feel funny"    Current Medications: Current Outpatient Medications  Medication Sig Dispense Refill   albuterol (VENTOLIN HFA) 108  (90 Base) MCG/ACT inhaler Inhale 2 puffs into the lungs every 6 (six) hours as needed for wheezing or shortness of breath. 8 g 2   ALPRAZolam (XANAX) 0.5 MG tablet Take 0.5 mg by mouth 3 (three) times daily.     aspirin 81 MG tablet Take 81 mg by mouth daily.     atorvastatin (LIPITOR) 80 MG tablet Take 1 tablet (80 mg total) by mouth daily. 90 tablet 3   budesonide (PULMICORT) 0.5 MG/2ML nebulizer solution Take 2 mLs (0.5 mg total) by nebulization in the morning and at bedtime. 320 mL 12   fluticasone (FLONASE) 50 MCG/ACT nasal spray Place 2 sprays into both nostrils daily.     furosemide (LASIX) 20 MG tablet Take 1 tablet (20 mg total) by mouth 2 (two) times daily for 5 days. 10 tablet 0   ipratropium-albuterol (DUONEB) 0.5-2.5 (3) MG/3ML SOLN Take 3 mLs by nebulization 2 (two) times daily.     ipratropium-albuterol (DUONEB) 0.5-2.5 (3) MG/3ML SOLN Take 3 mLs by nebulization every 6 (six) hours as needed. 360 mL 11   isosorbide mononitrate (IMDUR) 30 MG 24 hr tablet Take 1 tablet (30 mg total) by mouth daily. 90 tablet 3   ketoconazole (NIZORAL) 2 % cream Apply topically 2 (two) times daily.     lansoprazole (PREVACID) 30 MG capsule Take 1 capsule (30 mg total) by mouth 2 (two) times daily before a meal. 180 capsule 1   loratadine (CLARITIN) 10 MG tablet Take 10 mg by mouth daily.     losartan (COZAAR) 25 MG tablet Take 1 tablet (25 mg total) by mouth daily. 90 tablet 3   mirtazapine (REMERON) 45 MG tablet Take 45 mg by mouth at bedtime.     Multiple Vitamin (MULTIVITAMIN) capsule Take 1 capsule by mouth daily.     nitroGLYCERIN (NITROSTAT) 0.4 MG SL tablet Place 1 tablet (0.4 mg total) under the tongue every 5 (five) minutes as needed. 25 tablet 3   OXYGEN Inhale 2.5 L into the lungs daily.     promethazine-dextromethorphan (PROMETHAZINE-DM) 6.25-15 MG/5ML syrup Take 5 mLs by mouth every 6 (six) hours as needed.     Spacer/Aero-Holding Chambers (AEROCHAMBER MV) inhaler Use as instructed 1 each  2   azithromycin (ZITHROMAX) 250 MG tablet Take 2 tablets (500 mg) on  Day 1,  followed by 1 tablet (250 mg) once daily on Days 2 through 5. (Patient not taking: Reported on 03/21/2023) 6 each 0   docusate sodium (COLACE) 100 MG capsule Take 1 capsule (100 mg total) by mouth 2 (two) times daily. (Patient not taking: Reported on 03/21/2023) 60 capsule 0   LORazepam (ATIVAN) 1 MG tablet Take 1 tablet (1 mg total) by mouth 3 (three) times daily as needed for anxiety. (Patient not taking: Reported on 03/21/2023)     methylPREDNISolone (MEDROL DOSEPAK) 4 MG TBPK tablet Use as directed (Patient not  taking: Reported on 03/01/2023) 21 each 0   polyethylene glycol (MIRALAX) 17 g packet Take 17 g by mouth daily as needed. (Patient not taking: Reported on 03/21/2023)     sucralfate (CARAFATE) 1 g tablet Take 1 g by mouth 2 (two) times daily with a meal. (Patient not taking: Reported on 03/01/2023)     No current facility-administered medications for this [email or web address]

## 2023-03-28 ENCOUNTER — Encounter: Payer: Medicare Other | Admitting: *Deleted

## 2023-03-28 VITALS — Ht 64.0 in | Wt 200.6 lb

## 2023-03-28 DIAGNOSIS — J449 Chronic obstructive pulmonary disease, unspecified: Secondary | ICD-10-CM

## 2023-03-28 NOTE — Progress Notes (Signed)
Pulmonary Individual Treatment Plan  Patient Details  Name: Leslie Sims MRN: [id number] Date of Birth: 16-Feb-1951 Referring Provider:   Flowsheet Row Pulmonary Rehab from 03/28/2023 in [facility] Cardiac and Pulmonary Rehab  Referring Provider [name]       Initial Encounter Date:  Flowsheet Row Pulmonary Rehab from 03/28/2023 in [facility] Cardiac and Pulmonary Rehab  Date 03/28/23       Visit Diagnosis: Stage 4 very severe COPD by GOLD classification  Patient's Home Medications on Admission:  Current Outpatient Medications:    albuterol (VENTOLIN HFA) 108 (90 Base) MCG/ACT inhaler, Inhale 2 puffs into the lungs every 6 (six) hours as needed for wheezing or shortness of breath., Disp: 8 g, Rfl: 2   ALPRAZolam (XANAX) 0.5 MG tablet, Take 0.5 mg by mouth 3 (three) times daily., Disp: , Rfl:    aspirin 81 MG tablet, Take 81 mg by mouth daily., Disp: , Rfl:    atorvastatin (LIPITOR) 80 MG tablet, Take 1 tablet (80 mg total) by mouth daily., Disp: 90 tablet, Rfl: 3   azithromycin (ZITHROMAX) 250 MG tablet, Take 2 tablets (500 mg) on  Day 1,  followed by 1 tablet (250 mg) once daily on Days 2 through 5. (Patient not taking: Reported on 03/21/2023), Disp: 6 each, Rfl: 0   budesonide (PULMICORT) 0.5 MG/2ML nebulizer solution, Take 2 mLs (0.5 mg total) by nebulization in the morning and at bedtime., Disp: 320 mL, Rfl: 12   docusate sodium (COLACE) 100 MG capsule, Take 1 capsule (100 mg total) by mouth 2 (two) times daily. (Patient not taking: Reported on 03/21/2023), Disp: 60 capsule, Rfl: 0   fluticasone (FLONASE) 50 MCG/ACT nasal spray, Place 2 sprays into both nostrils daily., Disp: , Rfl:    furosemide (LASIX) 20 MG tablet, Take 1 tablet (20 mg total) by mouth 2 (two) times daily for 5 days., Disp: 10 tablet, Rfl: 0   ipratropium-albuterol (DUONEB) 0.5-2.5 (3) MG/3ML SOLN, Take 3 mLs by nebulization 2 (two) times daily., Disp: , Rfl:    ipratropium-albuterol (DUONEB) 0.5-2.5 (3) MG/3ML SOLN, Take  3 mLs by nebulization every 6 (six) hours as needed., Disp: 360 mL, Rfl: 11   isosorbide mononitrate (IMDUR) 30 MG 24 hr tablet, Take 1 tablet (30 mg total) by mouth daily., Disp: 90 tablet, Rfl: 3   ketoconazole (NIZORAL) 2 % cream, Apply topically 2 (two) times daily., Disp: , Rfl:    lansoprazole (PREVACID) 30 MG capsule, Take 1 capsule (30 mg total) by mouth 2 (two) times daily before a meal., Disp: 180 capsule, Rfl: 1   loratadine (CLARITIN) 10 MG tablet, Take 10 mg by mouth daily., Disp: , Rfl:    LORazepam (ATIVAN) 1 MG tablet, Take 1 tablet (1 mg total) by mouth 3 (three) times daily as needed for anxiety. (Patient not taking: Reported on 03/21/2023), Disp: , Rfl:    losartan (COZAAR) 25 MG tablet, Take 1 tablet (25 mg total) by mouth daily., Disp: 90 tablet, Rfl: 3   methylPREDNISolone (MEDROL DOSEPAK) 4 MG TBPK tablet, Use as directed (Patient not taking: Reported on 03/01/2023), Disp: 21 each, Rfl: 0   mirtazapine (REMERON) 45 MG tablet, Take 45 mg by mouth at bedtime., Disp: , Rfl:    Multiple Vitamin (MULTIVITAMIN) capsule, Take 1 capsule by mouth daily., Disp: , Rfl:    nitroGLYCERIN (NITROSTAT) 0.4 MG SL tablet, Place 1 tablet (0.4 mg total) under the tongue every 5 (five) minutes as needed., Disp: 25 tablet, Rfl: 3   OXYGEN, Inhale 2.5  L into the lungs daily., Disp: , Rfl:    polyethylene glycol (MIRALAX) 17 g packet, Take 17 g by mouth daily as needed. (Patient not taking: Reported on 03/21/2023), Disp: , Rfl:    promethazine-dextromethorphan (PROMETHAZINE-DM) 6.25-15 MG/5ML syrup, Take 5 mLs by mouth every 6 (six) hours as needed., Disp: , Rfl:    Spacer/Aero-Holding Chambers (AEROCHAMBER MV) inhaler, Use as instructed, Disp: 1 each, Rfl: 2   sucralfate (CARAFATE) 1 g tablet, Take 1 g by mouth 2 (two) times daily with a meal. (Patient not taking: Reported on 03/01/2023), Disp: , Rfl:   Past Medical History: Past Medical History:  Diagnosis Date   Allergic rhinitis    Anxiety    Back  pain    Cancer    prostate   CHF (congestive heart failure)    Colon polyps 08/2012   colonoscopy; multiple colon polyps; repeat colonoscopy in one year.   Complication of anesthesia    COPD (chronic obstructive pulmonary disease)    a. on home O2.   Coronary artery disease 11/2009   a. late presenting ant MI; b. LHC 100% pLAD s/p PCI/DES, 99% mRCA s/p PCI/DES, EF 35%; c. nuclear stress test 05/13: prior ant/inf infarcts w/o ischemia, EF 43%; d. LHC 02/14: widely patent stents with no other obs dz, EF 40%; e. LHC 11/17: LM nl, mLAD 10%, patent LAD stent, dLAD 20%, p-mRCA 10%, mRCA 40%, patent RCA stent, EF 35-45%    Depression    Diabetes    Dysphagia    Family history of adverse reaction to anesthesia    GERD (gastroesophageal reflux disease)    Headache(784.0)    Helicobacter pylori (H. pylori)    Hemorrhoids    Hernia    inguinal   HFrEF (heart failure with reduced ejection fraction)    a. 10/2016 LV gram: EF 35-45%; b. 06/2018 Echo: EF 40-45%. c. 01/2021 Echo: EF 40-45%   Hyperlipidemia    Hypertension    Insomnia    Ischemic cardiomyopathy    a. 10/2016 LV gram: EF 35-45%; b. 06/2018 Echo: EF 40-45%.   Myalgia    Oxygen dependent    Peptic ulcer    Pulmonary nodules    ST elevation (STEMI) myocardial infarction involving left anterior descending coronary artery 11/2009   a. s/p PCI to the LAD   Status post dilation of esophageal narrowing 2000   Thickening of esophagus    Tinea pedis    Wears dentures    partial upper    Tobacco Use: Social History   Tobacco Use  Smoking Status Former   Packs/day: 3.00   Years: 42.00   Additional pack years: 0.00   Total pack years: 126.00   Types: Cigarettes   Quit date: 02/10/2019   Years since quitting: 4.1  Smokeless Tobacco Never  Tobacco Comments   25 months ago most smoked 4 packs a day .    Labs: Review Flowsheet  More data exists      Latest Ref Rng & Units 09/29/2018 01/30/2019 04/10/2020 03/18/2021 01/26/2023   Labs for ITP Cardiac and Pulmonary Rehab  Cholestrol 0 - 200 mg/dL - - - 409  811   LDL (calc) 0 - 99 mg/dL - - - 81  47   HDL-C >91 mg/dL - - - 69  87   Trlycerides <150 mg/dL - - - 77  478   Hemoglobin A1c 4.8 - 5.6 % - 6.2  - - -  Bicarbonate 20.0 - 28.0 mmol/L 38.1  -  31.3  - -  O2 Saturation % - - 92.4  - -     Pulmonary Assessment Scores:  Pulmonary Assessment Scores     Row Name 03/28/23 1702         ADL UCSD   ADL Phase Entry     SOB Score total 77     Rest 1     Walk 3     Stairs 4     Bath 4     Dress 3     Shop 4       CAT Score   CAT Score 21       mMRC Score   mMRC Score 3              UCSD: Self-administered rating of dyspnea associated with activities of daily living (ADLs) 6-point scale (0 = "not at all" to 5 = "maximal or unable to do because of breathlessness")  Scoring Scores range from 0 to 120.  Minimally important difference is 5 units  CAT: CAT can identify the health impairment of COPD patients and is better correlated with disease progression.  CAT has a scoring range of zero to 40. The CAT score is classified into four groups of low (less than 10), medium (10 - 20), high (21-30) and very high (31-40) based on the impact level of disease on health status. A CAT score over 10 suggests significant symptoms.  A worsening CAT score could be explained by an exacerbation, poor medication adherence, poor inhaler technique, or progression of COPD or comorbid conditions.  CAT MCID is 2 points  mMRC: mMRC (Modified Medical Research Council) Dyspnea Scale is used to assess the degree of baseline functional disability in patients of respiratory disease due to dyspnea. No minimal important difference is established. A decrease in score of 1 point or greater is considered a positive change.   Pulmonary Function Assessment:  Pulmonary Function Assessment - 03/28/23 1651       Pulmonary Function Tests   FVC% 40 %    FEV1% 20 %   post  bronchodilator   FEV1/FVC Ratio 37             Exercise Target Goals: Exercise Program Goal: Individual exercise prescription set using results from initial 6 min walk test and THRR while considering  patient's activity barriers and safety.   Exercise Prescription Goal: Initial exercise prescription builds to 30-45 minutes a day of aerobic activity, 2-3 days per week.  Home exercise guidelines will be given to patient during program as part of exercise prescription that the participant will acknowledge.  Education: Aerobic Exercise: - Group verbal and visual presentation on the components of exercise prescription. Introduces F.I.T.T principle from ACSM for exercise prescriptions.  Reviews F.I.T.T. principles of aerobic exercise including progression. Written material given at graduation.   Education: Resistance Exercise: - Group verbal and visual presentation on the components of exercise prescription. Introduces F.I.T.T principle from ACSM for exercise prescriptions  Reviews F.I.T.T. principles of resistance exercise including progression. Written material given at graduation.    Education: Exercise & Equipment Safety: - Individual verbal instruction and demonstration of equipment use and safety with use of the equipment. Flowsheet Row Pulmonary Rehab from 03/28/2023 in [facility] Cardiac and Pulmonary Rehab  Date 03/28/23  Educator [facility]  Instruction Review Code 1- Verbalizes Understanding       Education: Exercise Physiology & General Exercise Guidelines: - Group verbal and written instruction with models to review the exercise physiology of the  cardiovascular system and associated critical values. Provides general exercise guidelines with specific guidelines to those with heart or lung disease.  Flowsheet Row Pulmonary Rehab from 03/28/2023 in [facility] Cardiac and Pulmonary Rehab  Education need identified 03/28/23       Education: Flexibility, Balance, Mind/Body Relaxation: - Group  verbal and visual presentation with interactive activity on the components of exercise prescription. Introduces F.I.T.T principle from ACSM for exercise prescriptions. Reviews F.I.T.T. principles of flexibility and balance exercise training including progression. Also discusses the mind body connection.  Reviews various relaxation techniques to help reduce and manage stress (i.e. Deep breathing, progressive muscle relaxation, and visualization). Balance handout provided to take home. Written material given at graduation.   Activity Barriers & Risk Stratification:  Activity Barriers & Cardiac Risk Stratification - 03/28/23 1647       Activity Barriers & Cardiac Risk Stratification   Activity Barriers Shortness of Breath             6 Minute Walk:  6 Minute Walk     Row Name 03/28/23 1644         6 Minute Walk   Phase Initial     Distance 765 feet     Walk Time 4.75 minutes     # of Rest Breaks 2     MPH 1.83     METS 1.9     RPE 13     Perceived Dyspnea  3     VO2 Peak 6.64     Symptoms No     Resting HR 87 bpm     Resting BP 122/60     Resting Oxygen Saturation  94 %     Exercise Oxygen Saturation  during 6 min walk 80 %     Max Ex. HR 116 bpm     Max Ex. BP 148/64     2 Minute Post BP 126/80       Interval HR   1 Minute HR 104     2 Minute HR 101     3 Minute HR 105     4 Minute HR 103     5 Minute HR 112     6 Minute HR 116     2 Minute Post HR 93     Interval Heart Rate? Yes       Interval Oxygen   Interval Oxygen? Yes     Baseline Oxygen Saturation % 94 %     1 Minute Oxygen Saturation % 90 %     1 Minute Liters of Oxygen 3 L     2 Minute Oxygen Saturation % 87 %     2 Minute Liters of Oxygen 3 L     3 Minute Oxygen Saturation % 81 %     3 Minute Liters of Oxygen 3 L     4 Minute Oxygen Saturation % 83 %     4 Minute Liters of Oxygen 3 L     5 Minute Oxygen Saturation % 80 %     5 Minute Liters of Oxygen 3 L     6 Minute Oxygen Saturation % 81 %      6 Minute Liters of Oxygen 3 L     2 Minute Post Oxygen Saturation % 89 %     2 Minute Post Liters of Oxygen 3 L             Oxygen Initial Assessment:  Oxygen Initial Assessment - 03/28/23 1700  Home Oxygen   Home Oxygen Device Home Concentrator;Portable Concentrator    Sleep Oxygen Prescription Continuous    Liters per minute 3    Home Exercise Oxygen Prescription Continuous    Liters per minute 3    Home Resting Oxygen Prescription Continuous    Liters per minute 3    Compliance with Home Oxygen Use Yes      Initial 6 min Walk   Oxygen Used Continuous;E-Tanks    Liters per minute 3      Program Oxygen Prescription   Program Oxygen Prescription Continuous;E-Tanks    Liters per minute 3      Intervention   Short Term Goals To learn and exhibit compliance with exercise, home and travel O2 prescription;To learn and understand importance of monitoring SPO2 with pulse oximeter and demonstrate accurate use of the pulse oximeter.;To learn and understand importance of maintaining oxygen saturations>88%;To learn and demonstrate proper pursed lip breathing techniques or other breathing techniques. ;To learn and demonstrate proper use of respiratory medications    Long  Term Goals Exhibits compliance with exercise, home  and travel O2 prescription;Verbalizes importance of monitoring SPO2 with pulse oximeter and return demonstration;Maintenance of O2 saturations>88%;Exhibits proper breathing techniques, such as pursed lip breathing or other method taught during program session;Compliance with respiratory medication;Demonstrates proper use of MDI's             Oxygen Re-Evaluation:   Oxygen Discharge (Final Oxygen Re-Evaluation):   Initial Exercise Prescription:  Initial Exercise Prescription - 03/28/23 1600       Date of Initial Exercise RX and Referring Provider   Date 03/28/23    Referring Provider [name]      Oxygen   Oxygen Continuous    Liters 3     Maintain Oxygen Saturation 88% or higher      Treadmill   MPH 11.4    Grade 0    Minutes 15    METs 2.02      Recumbant Bike   Level 1    RPM 50    Watts 15    Minutes 15      NuStep   Level 1    SPM 80    Minutes 15    METs 1.9      Arm Ergometer   Level 1    RPM 30    Minutes 15    METs 1.9      REL-XR   Level 1    Speed 50    Minutes 15    METs 1.9      Track   Laps 20    Minutes 15    METs 2.09      Prescription Details   Frequency (times per week) 3    Duration Progress to 30 minutes of continuous aerobic without signs/symptoms of physical distress      Intensity   THRR 40-80% of Max Heartrate 112-130    Ratings of Perceived Exertion 11-13    Perceived Dyspnea 0-4      Progression   Progression Continue to progress workloads to maintain intensity without signs/symptoms of physical distress.      Resistance Training   Training Prescription Yes    Weight 5    Reps 10-15             Perform Capillary Blood Glucose checks as needed.  Exercise Prescription Changes:   Exercise Prescription Changes     Row Name 03/28/23 1600  Response to Exercise   Blood Pressure (Admit) 122/60       Blood Pressure (Exercise) 148/64       Blood Pressure (Exit) 126/80       Heart Rate (Admit) 87 bpm       Heart Rate (Exercise) 116 bpm       Heart Rate (Exit) 93 bpm       Oxygen Saturation (Admit) 94 %       Oxygen Saturation (Exercise) 80 %       Oxygen Saturation (Exit) 95 %       Rating of Perceived Exertion (Exercise) 13       Perceived Dyspnea (Exercise) 3       Symptoms none       Comments 6 MWT resutls                Exercise Comments:   Exercise Goals and Review:   Exercise Goals     Row Name 03/28/23 1656             Exercise Goals   Increase Physical Activity Yes       Intervention Provide advice, education, support and counseling about physical activity/exercise needs.;Develop an individualized exercise  prescription for aerobic and resistive training based on initial evaluation findings, risk stratification, comorbidities and participant's personal goals.       Expected Outcomes Short Term: Attend rehab on a regular basis to increase amount of physical activity.;Long Term: Add in home exercise to make exercise part of routine and to increase amount of physical activity.;Long Term: Exercising regularly at least 3-5 days a week.       Increase Strength and Stamina Yes       Intervention Provide advice, education, support and counseling about physical activity/exercise needs.;Develop an individualized exercise prescription for aerobic and resistive training based on initial evaluation findings, risk stratification, comorbidities and participant's personal goals.       Expected Outcomes Short Term: Increase workloads from initial exercise prescription for resistance, speed, and METs.;Short Term: Perform resistance training exercises routinely during rehab and add in resistance training at home;Long Term: Improve cardiorespiratory fitness, muscular endurance and strength as measured by increased METs and functional capacity (6MWT)       Able to understand and use rate of perceived exertion (RPE) scale Yes       Intervention Provide education and explanation on how to use RPE scale       Expected Outcomes Short Term: Able to use RPE daily in rehab to express subjective intensity level       Able to understand and use Dyspnea scale Yes       Intervention Provide education and explanation on how to use Dyspnea scale       Expected Outcomes Short Term: Able to use Dyspnea scale daily in rehab to express subjective sense of shortness of breath during exertion;Long Term: Able to use Dyspnea scale to guide intensity level when exercising independently       Knowledge and understanding of Target Heart Rate Range (THRR) Yes       Intervention Provide education and explanation of THRR including how the numbers were  predicted and where they are located for reference       Expected Outcomes Short Term: Able to state/look up THRR;Long Term: Able to use THRR to govern intensity when exercising independently;Short Term: Able to use daily as guideline for intensity in rehab       Able to check pulse independently  Yes       Intervention Provide education and demonstration on how to check pulse in carotid and radial arteries.;Review the importance of being able to check your own pulse for safety during independent exercise       Expected Outcomes Short Term: Able to explain why pulse checking is important during independent exercise;Long Term: Able to check pulse independently and accurately       Understanding of Exercise Prescription Yes       Intervention Provide education, explanation, and written materials on patient's individual exercise prescription       Expected Outcomes Short Term: Able to explain program exercise prescription;Long Term: Able to explain home exercise prescription to exercise independently                Exercise Goals Re-Evaluation :   Discharge Exercise Prescription (Final Exercise Prescription Changes):  Exercise Prescription Changes - 03/28/23 1600       Response to Exercise   Blood Pressure (Admit) 122/60    Blood Pressure (Exercise) 148/64    Blood Pressure (Exit) 126/80    Heart Rate (Admit) 87 bpm    Heart Rate (Exercise) 116 bpm    Heart Rate (Exit) 93 bpm    Oxygen Saturation (Admit) 94 %    Oxygen Saturation (Exercise) 80 %    Oxygen Saturation (Exit) 95 %    Rating of Perceived Exertion (Exercise) 13    Perceived Dyspnea (Exercise) 3    Symptoms none    Comments 6 MWT resutls             Nutrition:  Target Goals: Understanding of nutrition guidelines, daily intake of sodium 1500mg , cholesterol 200mg , calories 30% from fat and 7% or less from saturated fats, daily to have 5 or more servings of fruits and vegetables.  Education: All About  Nutrition: -Group instruction provided by verbal, written material, interactive activities, discussions, models, and posters to present general guidelines for heart healthy nutrition including fat, fiber, MyPlate, the role of sodium in heart healthy nutrition, utilization of the nutrition label, and utilization of this knowledge for meal planning. Follow up email sent as well. Written material given at graduation. Flowsheet Row Pulmonary Rehab from 03/28/2023 in [facility] Cardiac and Pulmonary Rehab  Education need identified 03/28/23       Biometrics:  Pre Biometrics - 03/28/23 1657       Pre Biometrics   Height 5\' 4"  (1.626 m)    Weight 200 lb 9.6 oz (91 kg)    Waist Circumference 45 inches    Hip Circumference 43 inches    Waist to Hip Ratio 1.05 %    BMI (Calculated) 34.42    Single Leg Stand 8.06 seconds              Nutrition Therapy Plan and Nutrition Goals:  Nutrition Therapy & Goals - 03/28/23 1700       Intervention Plan   Intervention Prescribe, educate and counsel regarding individualized specific dietary modifications aiming towards targeted core components such as weight, hypertension, lipid management, diabetes, heart failure and other comorbidities.    Expected Outcomes Short Term Goal: Understand basic principles of dietary content, such as calories, fat, sodium, cholesterol and nutrients.;Short Term Goal: A plan has been developed with personal nutrition goals set during dietitian appointment.;Long Term Goal: Adherence to prescribed nutrition plan.             Nutrition Assessments:  MEDIFICTS Score Key: ?70 Need to make dietary changes  40-70 Heart  Healthy Diet ? 40 Therapeutic Level Cholesterol Diet   Picture Your Plate Scores: <40 Unhealthy dietary pattern with much room for improvement. 41-50 Dietary pattern unlikely to meet recommendations for good health and room for improvement. 51-60 More healthful dietary pattern, with some room for  improvement.  >60 Healthy dietary pattern, although there may be some specific behaviors that could be improved.   Nutrition Goals Re-Evaluation:   Nutrition Goals Discharge (Final Nutrition Goals Re-Evaluation):   Psychosocial: Target Goals: Acknowledge presence or absence of significant depression and/or stress, maximize coping skills, provide positive support system. Participant is able to verbalize types and ability to use techniques and skills needed for reducing stress and depression.   Education: Stress, Anxiety, and Depression - Group verbal and visual presentation to define topics covered.  Reviews how body is impacted by stress, anxiety, and depression.  Also discusses healthy ways to reduce stress and to treat/manage anxiety and depression.  Written material given at graduation. Flowsheet Row Pulmonary Rehab from 03/31/2018 in [facility] Cardiac and Pulmonary Rehab  Date 02/08/18  Educator [facility]  Instruction Review Code 1- [employer] Understanding       Education: Sleep Hygiene -Provides group verbal and written instruction about how sleep can affect your health.  Define sleep hygiene, discuss sleep cycles and impact of sleep habits. Review good sleep hygiene tips.    Initial Review & Psychosocial Screening:  Initial Psych Review & Screening - 03/21/23 1116       Initial Review   Current issues with Current Psychotropic Meds;Current Depression;Current Anxiety/Panic      Family Dynamics   Good Support System? Yes   daughters  live close, brother in law takes him to appts     Barriers   Psychosocial barriers to participate in program There are no identifiable barriers or psychosocial needs.      Screening Interventions   Interventions Encouraged to exercise;To provide support and resources with identified psychosocial needs;Provide feedback about the scores to participant    Expected Outcomes Short Term goal: Utilizing psychosocial counselor, staff and physician to assist with  identification of specific Stressors or current issues interfering with healing process. Setting desired goal for each stressor or current issue identified.;Long Term Goal: Stressors or current issues are controlled or eliminated.;Short Term goal: Identification and review with participant of any Quality of Life or Depression concerns found by scoring the questionnaire.;Long Term goal: The participant improves quality of Life and PHQ9 Scores as seen by post scores and/or verbalization of changes             Quality of Life Scores:  Scores of 19 and below usually indicate a poorer quality of life in these areas.  A difference of  2-3 points is a clinically meaningful difference.  A difference of 2-3 points in the total score of the Quality of Life Index has been associated with significant improvement in overall quality of life, self-image, physical symptoms, and general health in studies assessing change in quality of life.  PHQ-9: Review Flowsheet  More data exists      03/28/2023 07/07/2018 05/22/2018 04/19/2018 03/03/2018  Depression screen PHQ 2/9  Decreased Interest 3 2 2 3 3   Down, Depressed, Hopeless 3 2 2 3 3   PHQ - 2 Score 6 4 4 6 6   Altered sleeping 2 2 1 3 3   Tired, decreased energy 2 2 1 3 3   Change in appetite 2 2 1 3 1   Feeling bad or failure about yourself  2 2 1  3 1  Trouble concentrating 2 0 0 1 1  Moving slowly or fidgety/restless 2 0 0 0 1  Suicidal thoughts 0 0 0 0 1  PHQ-9 Score 18 12 8 19 17   Difficult doing work/chores Somewhat difficult - - - -   Interpretation of Total Score  Total Score Depression Severity:  1-4 = Minimal depression, 5-9 = Mild depression, 10-14 = Moderate depression, 15-19 = Moderately severe depression, 20-27 = Severe depression   Psychosocial Evaluation and Intervention:  Psychosocial Evaluation - 03/21/23 1125       Psychosocial Evaluation & Interventions   Comments [place] has no barriers to attending the program. He lives alone. HIs  daughter and brother in law are his support. He has history of depression/anxiety and is on medications managed by his primary physician. He wants to see if he can improve his breathing and be able to perform activities longer than he is at this time. He does wear oxygen all day and night and is compliant. He is ready to return to the program. He was here in 2019.    He is still greiving the loss of his wife in 2019;they had been married 40 years.    Expected Outcomes STG Hairo will attend all scheduled sessions, he will continue to work on controlling his depression/anxiety symptoms. He will follow the exercise progression to work on improving his breathing. LTG Jayen maintiais control of his depression/anxiety symptoms. He is able to be a bit more active without increased shortness of breath.    Continue Psychosocial Services  Follow up required by staff             Psychosocial Re-Evaluation:   Psychosocial Discharge (Final Psychosocial Re-Evaluation):   Education: Education Goals: Education classes will be provided on a weekly basis, covering required topics. Participant will state understanding/return demonstration of topics presented.  Learning Barriers/Preferences:   General Pulmonary Education Topics:  Infection Prevention: - Provides verbal and written material to individual with discussion of infection control including proper hand washing and proper equipment cleaning during exercise session. Flowsheet Row Pulmonary Rehab from 03/28/2023 in [facility] Cardiac and Pulmonary Rehab  Date 03/28/23  Educator [facility]  Instruction Review Code 1- Verbalizes Understanding       Falls Prevention: - Provides verbal and written material to individual with discussion of falls prevention and safety. Flowsheet Row Pulmonary Rehab from 03/28/2023 in [facility] Cardiac and Pulmonary Rehab  Date 03/28/23  Educator [facility]  Instruction Review Code 5- Refused Teaching       Chronic Lung Disease  Review: - Group verbal instruction with posters, models, PowerPoint presentations and videos,  to review new updates, new respiratory medications, new advancements in procedures and treatments. Providing information on websites and "800" numbers for continued self-education. Includes information about supplement oxygen, available portable oxygen systems, continuous and intermittent flow rates, oxygen safety, concentrators, and Medicare reimbursement for oxygen. Explanation of Pulmonary Drugs, including class, frequency, complications, importance of spacers, rinsing mouth after steroid MDI's, and proper cleaning methods for nebulizers. Review of basic lung anatomy and physiology related to function, structure, and complications of lung disease. Review of risk factors. Discussion about methods for diagnosing sleep apnea and types of masks and machines for OSA. Includes a review of the use of types of environmental controls: home humidity, furnaces, filters, dust mite/pet prevention, HEPA vacuums. Discussion about weather changes, air quality and the benefits of nasal washing. Instruction on Warning signs, infection symptoms, calling MD promptly, preventive modes, and value of vaccinations.  Review of effective airway clearance, coughing and/or vibration techniques. Emphasizing that all should Create an Action Plan. Written material given at graduation. Flowsheet Row Pulmonary Rehab from 03/28/2023 in [facility] Cardiac and Pulmonary Rehab  Education need identified 03/28/23       AED/CPR: - Group verbal and written instruction with the use of models to demonstrate the basic use of the AED with the basic ABC's of resuscitation. Flowsheet Row Pulmonary Rehab from 03/31/2018 in [facility] Cardiac and Pulmonary Rehab  Date 02/17/18  Educator [facility]  Instruction Review Code 1- Verbalizes Understanding        Anatomy and Cardiac Procedures: - Group verbal and visual presentation and models provide information about basic  cardiac anatomy and function. Reviews the testing methods done to diagnose heart disease and the outcomes of the test results. Describes the treatment choices: Medical Management, Angioplasty, or Coronary Bypass Surgery for treating various heart conditions including Myocardial Infarction, Angina, Valve Disease, and Cardiac Arrhythmias.  Written material given at graduation.   Medication Safety: - Group verbal and visual instruction to review commonly prescribed medications for heart and lung disease. Reviews the medication, class of the drug, and side effects. Includes the steps to properly store meds and maintain the prescription regimen.  Written material given at graduation. Flowsheet Row Pulmonary Rehab from 03/31/2018 in [facility] Cardiac and Pulmonary Rehab  Date 02/03/18  Educator [facility]  Instruction Review Code 1- Verbalizes Understanding       Other: -Provides group and verbal instruction on various topics (see comments)   Knowledge Questionnaire Score:  Knowledge Questionnaire Score - 03/28/23 1702       Knowledge Questionnaire Score   Pre Score 12/18              Core Components/Risk Factors/Patient Goals at Admission:  Personal Goals and Risk Factors at Admission - 03/28/23 1657       Core Components/Risk Factors/Patient Goals on Admission    Weight Management Yes    Intervention Weight Management: Develop a combined nutrition and exercise program designed to reach desired caloric intake, while maintaining appropriate intake of nutrient and fiber, sodium and fats, and appropriate energy expenditure required for the weight goal.;Weight Management: Provide education and appropriate resources to help participant work on and attain dietary goals.;Weight Management/Obesity: Establish reasonable short term and long term weight goals.    Admit Weight 200 lb 9.6 oz (91 kg)    Goal Weight: Short Term 190 lb (86.2 kg)    Goal Weight: Long Term 175 lb (79.4 kg)    Expected Outcomes  Short Term: Continue to assess and modify interventions until short term weight is achieved;Long Term: Adherence to nutrition and physical activity/exercise program aimed toward attainment of established weight goal;Weight Loss: Understanding of general recommendations for a balanced deficit meal plan, which promotes 1-2 lb weight loss per week and includes a negative energy balance of [phone] kcal/d;Understanding recommendations for meals to include 15-35% energy as protein, 25-35% energy from fat, 35-60% energy from carbohydrates, less than 200mg  of dietary cholesterol, 20-35 gm of total fiber daily;Understanding of distribution of calorie intake throughout the day with the consumption of 4-5 meals/snacks    Improve shortness of breath with ADL's Yes    Intervention Provide education, individualized exercise plan and daily activity instruction to help decrease symptoms of SOB with activities of daily living.    Expected Outcomes Short Term: Improve cardiorespiratory fitness to achieve a reduction of symptoms when performing ADLs;Long Term: Be able to perform more ADLs without symptoms  or delay the onset of symptoms    Increase knowledge of respiratory medications and ability to use respiratory devices properly  Yes    Intervention Provide education and demonstration as needed of appropriate use of medications, inhalers, and oxygen therapy.    Expected Outcomes Short Term: Achieves understanding of medications use. Understands that oxygen is a medication prescribed by physician. Demonstrates appropriate use of inhaler and oxygen therapy.;Long Term: Maintain appropriate use of medications, inhalers, and oxygen therapy.    Diabetes Yes   no meds, getting A1C checked soon to confirm proper diagnosis   Intervention Provide education about signs/symptoms and action to take for hypo/hyperglycemia.;Provide education about proper nutrition, including hydration, and aerobic/resistive exercise prescription along  with prescribed medications to achieve blood glucose in normal ranges: Fasting glucose 65-99 mg/dL    Expected Outcomes Short Term: Participant verbalizes understanding of the signs/symptoms and immediate care of hyper/hypoglycemia, proper foot care and importance of medication, aerobic/resistive exercise and nutrition plan for blood glucose control.;Long Term: Attainment of HbA1C < 7%.    Heart Failure Yes    Intervention Provide a combined exercise and nutrition program that is supplemented with education, support and counseling about heart failure. Directed toward relieving symptoms such as shortness of breath, decreased exercise tolerance, and extremity edema.    Expected Outcomes Improve functional capacity of life;Short term: Attendance in program 2-3 days a week with increased exercise capacity. Reported lower sodium intake. Reported increased fruit and vegetable intake. Reports medication compliance.;Short term: Daily weights obtained and reported for increase. Utilizing diuretic protocols set by physician.;Long term: Adoption of self-care skills and reduction of barriers for early signs and symptoms recognition and intervention leading to self-care maintenance.    Hypertension Yes    Intervention Provide education on lifestyle modifcations including regular physical activity/exercise, weight management, moderate sodium restriction and increased consumption of fresh fruit, vegetables, and low fat dairy, alcohol moderation, and smoking cessation.;Monitor prescription use compliance.    Expected Outcomes Short Term: Continued assessment and intervention until BP is < 140/7mm HG in hypertensive participants. < 130/40mm HG in hypertensive participants with diabetes, heart failure or chronic kidney disease.;Long Term: Maintenance of blood pressure at goal levels.    Lipids Yes    Intervention Provide education and support for participant on nutrition & aerobic/resistive exercise along with prescribed  medications to achieve LDL 70mg , HDL >40mg .    Expected Outcomes Short Term: Participant states understanding of desired cholesterol values and is compliant with medications prescribed. Participant is following exercise prescription and nutrition guidelines.;Long Term: Cholesterol controlled with medications as prescribed, with individualized exercise RX and with personalized nutrition plan. Value goals: LDL < 70mg , HDL > 40 mg.    Stress Yes    Intervention Offer individual and/or small group education and counseling on adjustment to heart disease, stress management and health-related lifestyle change. Teach and support self-help strategies.;Refer participants experiencing significant psychosocial distress to appropriate mental health specialists for further evaluation and treatment. When possible, include family members and significant others in education/counseling sessions.    Expected Outcomes Short Term: Participant demonstrates changes in health-related behavior, relaxation and other stress management skills, ability to obtain effective social support, and compliance with psychotropic medications if prescribed.;Long Term: Emotional wellbeing is indicated by absence of clinically significant psychosocial distress or social isolation.             Education:Diabetes - Individual verbal and written instruction to review signs/symptoms of diabetes, desired ranges of glucose level fasting, after meals and with exercise. Acknowledge  that pre and post exercise glucose checks will be done for 3 sessions at entry of program.   Know Your Numbers and Heart Failure: - Group verbal and visual instruction to discuss disease risk factors for cardiac and pulmonary disease and treatment options.  Reviews associated critical values for Overweight/Obesity, Hypertension, Cholesterol, and Diabetes.  Discusses basics of heart failure: signs/symptoms and treatments.  Introduces Heart Failure Zone chart for action  plan for heart failure.  Written material given at graduation.   Core Components/Risk Factors/Patient Goals Review:    Core Components/Risk Factors/Patient Goals at Discharge (Final Review):    ITP Comments:  ITP Comments     Row Name 03/21/23 1132 03/28/23 1643         ITP Comments Virtual orientation call completed today. he has an appointment on Date: 03/28/2023  for EP eval and gym Orientation.  Documentation of diagnosis can be found in [facility] Date: 03/01/2023 . Completed and gym orientation. Initial ITP created and sent for review to Dr. Jinny Sanders, Medical Director.               Comments: initial ITP
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
## 2023-03-28 NOTE — Patient Instructions (Signed)
Patient Instructions  Patient Details  Name: Leslie Sims MRN: [id number] Date of Birth: 1951-06-02 Referring Provider:  Salena Saner, MD  Below are your personal goals for exercise, nutrition, and risk factors. Our goal is to help you stay on track towards obtaining and maintaining these goals. We will be discussing your progress on these goals with you throughout the program.  Initial Exercise Prescription:  Initial Exercise Prescription - 03/28/23 1600       Date of Initial Exercise RX and Referring Provider   Date 03/28/23    Referring Provider [name]      Oxygen   Oxygen Continuous    Liters 3    Maintain Oxygen Saturation 88% or higher      Treadmill   MPH 11.4    Grade 0    Minutes 15    METs 2.02      Recumbant Bike   Level 1    RPM 50    Watts 15    Minutes 15      NuStep   Level 1    SPM 80    Minutes 15    METs 1.9      Arm Ergometer   Level 1    RPM 30    Minutes 15    METs 1.9      REL-XR   Level 1    Speed 50    Minutes 15    METs 1.9      Track   Laps 20    Minutes 15    METs 2.09      Prescription Details   Frequency (times per week) 3    Duration Progress to 30 minutes of continuous aerobic without signs/symptoms of physical distress      Intensity   THRR 40-80% of Max Heartrate 112-130    Ratings of Perceived Exertion 11-13    Perceived Dyspnea 0-4      Progression   Progression Continue to progress workloads to maintain intensity without signs/symptoms of physical distress.      Resistance Training   Training Prescription Yes    Weight 5    Reps 10-15             Exercise Goals: Frequency: Be able to perform aerobic exercise two to three times per week in program working toward 2-5 days per week of home exercise.  Intensity: Work with a perceived exertion of 11 (fairly light) - 15 (hard) while following your exercise prescription.  We will make changes to your prescription with you as you progress  through the program.   Duration: Be able to do 30 to 45 minutes of continuous aerobic exercise in addition to a 5 minute warm-up and a 5 minute cool-down routine.   Nutrition Goals: Your personal nutrition goals will be established when you do your nutrition analysis with the dietician.  The following are general nutrition guidelines to follow: Cholesterol < 200mg /day Sodium < 1500mg /day Fiber: Men over 50 yrs - 30 grams per day  Personal Goals:  Personal Goals and Risk Factors at Admission - 03/28/23 1657       Core Components/Risk Factors/Patient Goals on Admission    Weight Management Yes    Intervention Weight Management: Develop a combined nutrition and exercise program designed to reach desired caloric intake, while maintaining appropriate intake of nutrient and fiber, sodium and fats, and appropriate energy expenditure required for the weight goal.;Weight Management: Provide education and appropriate resources to help participant work on  and attain dietary goals.;Weight Management/Obesity: Establish reasonable short term and long term weight goals.    Admit Weight 200 lb 9.6 oz (91 kg)    Goal Weight: Short Term 190 lb (86.2 kg)    Goal Weight: Long Term 175 lb (79.4 kg)    Expected Outcomes Short Term: Continue to assess and modify interventions until short term weight is achieved;Long Term: Adherence to nutrition and physical activity/exercise program aimed toward attainment of established weight goal;Weight Loss: Understanding of general recommendations for a balanced deficit meal plan, which promotes 1-2 lb weight loss per week and includes a negative energy balance of [phone] kcal/d;Understanding recommendations for meals to include 15-35% energy as protein, 25-35% energy from fat, 35-60% energy from carbohydrates, less than 200mg  of dietary cholesterol, 20-35 gm of total fiber daily;Understanding of distribution of calorie intake throughout the day with the consumption of 4-5  meals/snacks    Improve shortness of breath with ADL's Yes    Intervention Provide education, individualized exercise plan and daily activity instruction to help decrease symptoms of SOB with activities of daily living.    Expected Outcomes Short Term: Improve cardiorespiratory fitness to achieve a reduction of symptoms when performing ADLs;Long Term: Be able to perform more ADLs without symptoms or delay the onset of symptoms    Increase knowledge of respiratory medications and ability to use respiratory devices properly  Yes    Intervention Provide education and demonstration as needed of appropriate use of medications, inhalers, and oxygen therapy.    Expected Outcomes Short Term: Achieves understanding of medications use. Understands that oxygen is a medication prescribed by physician. Demonstrates appropriate use of inhaler and oxygen therapy.;Long Term: Maintain appropriate use of medications, inhalers, and oxygen therapy.    Diabetes Yes   no meds, getting A1C checked soon to confirm proper diagnosis   Intervention Provide education about signs/symptoms and action to take for hypo/hyperglycemia.;Provide education about proper nutrition, including hydration, and aerobic/resistive exercise prescription along with prescribed medications to achieve blood glucose in normal ranges: Fasting glucose 65-99 mg/dL    Expected Outcomes Short Term: Participant verbalizes understanding of the signs/symptoms and immediate care of hyper/hypoglycemia, proper foot care and importance of medication, aerobic/resistive exercise and nutrition plan for blood glucose control.;Long Term: Attainment of HbA1C < 7%.    Heart Failure Yes    Intervention Provide a combined exercise and nutrition program that is supplemented with education, support and counseling about heart failure. Directed toward relieving symptoms such as shortness of breath, decreased exercise tolerance, and extremity edema.    Expected Outcomes Improve  functional capacity of life;Short term: Attendance in program 2-3 days a week with increased exercise capacity. Reported lower sodium intake. Reported increased fruit and vegetable intake. Reports medication compliance.;Short term: Daily weights obtained and reported for increase. Utilizing diuretic protocols set by physician.;Long term: Adoption of self-care skills and reduction of barriers for early signs and symptoms recognition and intervention leading to self-care maintenance.    Hypertension Yes    Intervention Provide education on lifestyle modifcations including regular physical activity/exercise, weight management, moderate sodium restriction and increased consumption of fresh fruit, vegetables, and low fat dairy, alcohol moderation, and smoking cessation.;Monitor prescription use compliance.    Expected Outcomes Short Term: Continued assessment and intervention until BP is < 140/45mm HG in hypertensive participants. < 130/34mm HG in hypertensive participants with diabetes, heart failure or chronic kidney disease.;Long Term: Maintenance of blood pressure at goal levels.    Lipids Yes    Intervention Provide education  and support for participant on nutrition & aerobic/resistive exercise along with prescribed medications to achieve LDL 70mg , HDL >40mg .    Expected Outcomes Short Term: Participant states understanding of desired cholesterol values and is compliant with medications prescribed. Participant is following exercise prescription and nutrition guidelines.;Long Term: Cholesterol controlled with medications as prescribed, with individualized exercise RX and with personalized nutrition plan. Value goals: LDL < 70mg , HDL > 40 mg.    Stress Yes    Intervention Offer individual and/or small group education and counseling on adjustment to heart disease, stress management and health-related lifestyle change. Teach and support self-help strategies.;Refer participants experiencing significant  psychosocial distress to appropriate mental health specialists for further evaluation and treatment. When possible, include family members and significant others in education/counseling sessions.    Expected Outcomes Short Term: Participant demonstrates changes in health-related behavior, relaxation and other stress management skills, ability to obtain effective social support, and compliance with psychotropic medications if prescribed.;Long Term: Emotional wellbeing is indicated by absence of clinically significant psychosocial distress or social isolation.             Tobacco Use Initial Evaluation: Social History   Tobacco Use  Smoking Status Former   Packs/day: 3.00   Years: 42.00   Additional pack years: 0.00   Total pack years: 126.00   Types: Cigarettes   Quit date: 02/10/2019   Years since quitting: 4.1  Smokeless Tobacco Never  Tobacco Comments   25 months ago most smoked 4 packs a day .    Exercise Goals and Review:  Exercise Goals     Row Name 03/28/23 1656             Exercise Goals   Increase Physical Activity Yes       Intervention Provide advice, education, support and counseling about physical activity/exercise needs.;Develop an individualized exercise prescription for aerobic and resistive training based on initial evaluation findings, risk stratification, comorbidities and participant's personal goals.       Expected Outcomes Short Term: Attend rehab on a regular basis to increase amount of physical activity.;Long Term: Add in home exercise to make exercise part of routine and to increase amount of physical activity.;Long Term: Exercising regularly at least 3-5 days a week.       Increase Strength and Stamina Yes       Intervention Provide advice, education, support and counseling about physical activity/exercise needs.;Develop an individualized exercise prescription for aerobic and resistive training based on initial evaluation findings, risk stratification,  comorbidities and participant's personal goals.       Expected Outcomes Short Term: Increase workloads from initial exercise prescription for resistance, speed, and METs.;Short Term: Perform resistance training exercises routinely during rehab and add in resistance training at home;Long Term: Improve cardiorespiratory fitness, muscular endurance and strength as measured by increased METs and functional capacity ( )       Able to understand and use rate of perceived exertion (RPE) scale Yes       Intervention Provide education and explanation on how to use RPE scale       Expected Outcomes Short Term: Able to use RPE daily in rehab to express subjective intensity level       Able to understand and use Dyspnea scale Yes       Intervention Provide education and explanation on how to use Dyspnea scale       Expected Outcomes Short Term: Able to use Dyspnea scale daily in rehab to express subjective sense of shortness of  breath during exertion;Long Term: Able to use Dyspnea scale to guide intensity level when exercising independently       Knowledge and understanding of Target Heart Rate Range (THRR) Yes       Intervention Provide education and explanation of THRR including how the numbers were predicted and where they are located for reference       Expected Outcomes Short Term: Able to state/look up THRR;Long Term: Able to use THRR to govern intensity when exercising independently;Short Term: Able to use daily as guideline for intensity in rehab       Able to check pulse independently Yes       Intervention Provide education and demonstration on how to check pulse in carotid and radial arteries.;Review the importance of being able to check your own pulse for safety during independent exercise       Expected Outcomes Short Term: Able to explain why pulse checking is important during independent exercise;Long Term: Able to check pulse independently and accurately       Understanding of Exercise  Prescription Yes       Intervention Provide education, explanation, and written materials on patient's individual exercise prescription       Expected Outcomes Short Term: Able to explain program exercise prescription;Long Term: Able to explain home exercise prescription to exercise independently                Copy of goals given to participant.
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
## 2023-03-29 ENCOUNTER — Telehealth: Payer: Self-pay

## 2023-03-29 ENCOUNTER — Ambulatory Visit: Payer: Medicare Other | Attending: Physician Assistant | Admitting: Physician Assistant

## 2023-03-29 ENCOUNTER — Other Ambulatory Visit
Admission: RE | Admit: 2023-03-29 | Discharge: 2023-03-29 | Disposition: A | Discharge status: Home / Self Care | Payer: Medicare Other | Source: Ambulatory Visit | Attending: Physician Assistant | Admitting: Physician Assistant

## 2023-03-29 ENCOUNTER — Encounter: Payer: Self-pay | Admitting: Physician Assistant

## 2023-03-29 ENCOUNTER — Other Ambulatory Visit: Payer: Self-pay | Admitting: Physician Assistant

## 2023-03-29 VITALS — BP 98/76 | HR 86 | Ht 70.0 in | Wt 200.6 lb

## 2023-03-29 DIAGNOSIS — J9611 Chronic respiratory failure with hypoxia: Secondary | ICD-10-CM | POA: Insufficient documentation

## 2023-03-29 DIAGNOSIS — I255 Ischemic cardiomyopathy: Secondary | ICD-10-CM | POA: Diagnosis present

## 2023-03-29 DIAGNOSIS — I5022 Chronic systolic (congestive) heart failure: Secondary | ICD-10-CM | POA: Diagnosis present

## 2023-03-29 DIAGNOSIS — E785 Hyperlipidemia, unspecified: Secondary | ICD-10-CM | POA: Insufficient documentation

## 2023-03-29 DIAGNOSIS — J449 Chronic obstructive pulmonary disease, unspecified: Secondary | ICD-10-CM

## 2023-03-29 DIAGNOSIS — I25118 Atherosclerotic heart disease of native coronary artery with other forms of angina pectoris: Secondary | ICD-10-CM | POA: Insufficient documentation

## 2023-03-29 DIAGNOSIS — I10 Essential (primary) hypertension: Secondary | ICD-10-CM | POA: Insufficient documentation

## 2023-03-29 DIAGNOSIS — I25110 Atherosclerotic heart disease of native coronary artery with unstable angina pectoris: Secondary | ICD-10-CM | POA: Insufficient documentation

## 2023-03-29 LAB — BASIC METABOLIC PANEL
Anion gap: 7 (ref 5–15)
BUN: 15 mg/dL (ref 8–23)
CO2: 31 mmol/L (ref 22–32)
Calcium: 8.9 mg/dL (ref 8.9–10.3)
Chloride: 104 mmol/L (ref 98–111)
Creatinine, Ser: 0.94 mg/dL (ref 0.61–1.24)
GFR, Estimated: >60 mL/min (ref >60)
Glucose, Bld: 114 mg/dL — ABNORMAL HIGH (ref 70–99)
Potassium: 3.6 mmol/L (ref 3.5–5.1)
Sodium: 142 mmol/L (ref 135–145)

## 2023-03-29 LAB — CBC
HCT: 41.5 % (ref 39.0–52.0)
Hemoglobin: 13 g/dL (ref 13.0–17.0)
MCH: 30.3 pg (ref 26.0–34.0)
MCHC: 31.3 g/dL (ref 30.0–36.0)
MCV: 96.7 fL (ref 80.0–100.0)
Platelets: 205 10*3/uL (ref 150–400)
RBC: 4.29 MIL/uL (ref 4.22–5.81)
RDW: 12.6 % (ref 11.5–15.5)
WBC: 6.3 10*3/uL (ref 4.0–10.5)
nRBC: 0 % (ref 0.0–0.2)

## 2023-03-29 NOTE — Patient Instructions (Signed)
Medication Instructions:  No changes at this time.   *If you need a refill on your cardiac medications before your next appointment, please call your pharmacy*   Lab Work: CBC & BMP today over at the [facility]. Stop at registration desk to check in.   If you have labs (blood work) drawn today and your tests are completely normal, you will receive your results only by: MyChart Message (if you have MyChart) OR A paper copy in the mail If you have any lab test that is abnormal or we need to change your treatment, we will call you to review the results.   Testing/Procedures:  [place] [facility] DEPT OF [facility] AT [place] [address] [place] [address] Dept: [phone] Loc: [phone]  Jozeph Romer [facility]  03/29/2023  You are scheduled for a Cardiac Catheterization on Monday, April 15 with Dr. Lorine Bears.  1. Please arrive at the Heart & Vascular Center Entrance of [facility], [address], [place] [address] at 08:30 am/pm (This is 1 hour prior to your procedure time).  Proceed to the Check-In Desk directly inside the entrance.  Procedure Parking: Use the entrance off of the [address] side of the hospital. Turn right upon entering and follow the driveway to parking that is directly in front of the Heart & Vascular Center. There is no valet parking available at this entrance, however there is an awning directly in front of the Heart & Vascular Center for drop off/ pick up for patients.  Special note: Every effort is made to have your procedure done on time. Please understand that emergencies sometimes delay scheduled procedures.  2. Diet: Do not eat solid foods after midnight.  The patient may have clear liquids until 5am upon the day of the procedure.  3. Medication instructions in preparation for your procedure:   Stop taking, Lasix (Furosemide)  Monday, April 15,    On the  morning of your procedure, take your Aspirin 81 mg and any morning medicines NOT listed above.  You may use sips of water.  5. Plan to go home the same day, you will only stay overnight if medically necessary. 6. Bring a current list of your medications and current insurance cards. 7. You MUST have a responsible person to drive you home. 8. Someone MUST be with you the first 24 hours after you arrive home or your discharge will be delayed. 9. Please wear clothes that are easy to get on and off and wear slip-on shoes.  Thank you for allowing [place] to care for you!   -- [place]    Follow-Up: At [facility], you and your health needs are our priority.  As part of our continuing mission to provide you with exceptional heart care, we have created designated Provider Care Teams.  These Care Teams include your primary Cardiologist (physician) and Advanced Practice Providers (APPs -  Physician Assistants and Nurse Practitioners) who all work together to provide you with the care you need, when you need it.   Your next appointment:   2 week(s) after heart cath  Provider:   Lorine Bears, MD or Eula Listen, PA-C

## 2023-03-29 NOTE — Telephone Encounter (Signed)
Patient called to let us know he is scheduled for a heart cath on 4/15, and has to delay his start date with pulmonary rehab with [place]. Patient will need clearance to return- patient aware and will call us to let us know when he is able to return with clearance.

## 2023-03-29 NOTE — Progress Notes (Signed)
Orders for cardiac cath placed. 

## 2023-04-04 ENCOUNTER — Ambulatory Visit
Admission: RE | Admit: 2023-04-04 | Discharge: 2023-04-04 | Disposition: A | Discharge status: Home / Self Care | Payer: Medicare Other | Attending: Cardiovascular Disease | Admitting: Cardiovascular Disease

## 2023-04-04 ENCOUNTER — Other Ambulatory Visit: Payer: Self-pay

## 2023-04-04 ENCOUNTER — Encounter
Admission: RE | Disposition: A | Discharge status: Home / Self Care | Payer: Self-pay | Source: Home / Self Care | Attending: Cardiovascular Disease

## 2023-04-04 ENCOUNTER — Encounter: Payer: Self-pay | Admitting: Cardiovascular Disease

## 2023-04-04 DIAGNOSIS — I255 Ischemic cardiomyopathy: Secondary | ICD-10-CM | POA: Insufficient documentation

## 2023-04-04 DIAGNOSIS — I110 Hypertensive heart disease with heart failure: Secondary | ICD-10-CM | POA: Insufficient documentation

## 2023-04-04 DIAGNOSIS — I5042 Chronic combined systolic (congestive) and diastolic (congestive) heart failure: Secondary | ICD-10-CM

## 2023-04-04 DIAGNOSIS — J449 Chronic obstructive pulmonary disease, unspecified: Secondary | ICD-10-CM | POA: Insufficient documentation

## 2023-04-04 DIAGNOSIS — Z87891 Personal history of nicotine dependence: Secondary | ICD-10-CM | POA: Insufficient documentation

## 2023-04-04 DIAGNOSIS — Z9981 Dependence on supplemental oxygen: Secondary | ICD-10-CM | POA: Diagnosis not present

## 2023-04-04 DIAGNOSIS — Z9221 Personal history of antineoplastic chemotherapy: Secondary | ICD-10-CM | POA: Diagnosis not present

## 2023-04-04 DIAGNOSIS — J9611 Chronic respiratory failure with hypoxia: Secondary | ICD-10-CM | POA: Insufficient documentation

## 2023-04-04 DIAGNOSIS — Z955 Presence of coronary angioplasty implant and graft: Secondary | ICD-10-CM | POA: Insufficient documentation

## 2023-04-04 DIAGNOSIS — E785 Hyperlipidemia, unspecified: Secondary | ICD-10-CM | POA: Insufficient documentation

## 2023-04-04 DIAGNOSIS — I5022 Chronic systolic (congestive) heart failure: Secondary | ICD-10-CM | POA: Diagnosis not present

## 2023-04-04 DIAGNOSIS — Z8546 Personal history of malignant neoplasm of prostate: Secondary | ICD-10-CM | POA: Diagnosis not present

## 2023-04-04 DIAGNOSIS — I25118 Atherosclerotic heart disease of native coronary artery with other forms of angina pectoris: Secondary | ICD-10-CM | POA: Insufficient documentation

## 2023-04-04 DIAGNOSIS — C159 Malignant neoplasm of esophagus, unspecified: Secondary | ICD-10-CM | POA: Diagnosis not present

## 2023-04-04 DIAGNOSIS — I272 Pulmonary hypertension, unspecified: Secondary | ICD-10-CM | POA: Insufficient documentation

## 2023-04-04 DIAGNOSIS — Z923 Personal history of irradiation: Secondary | ICD-10-CM | POA: Insufficient documentation

## 2023-04-04 DIAGNOSIS — I252 Old myocardial infarction: Secondary | ICD-10-CM | POA: Diagnosis not present

## 2023-04-04 DIAGNOSIS — I2510 Atherosclerotic heart disease of native coronary artery without angina pectoris: Secondary | ICD-10-CM | POA: Diagnosis present

## 2023-04-04 DIAGNOSIS — Z79899 Other long term (current) drug therapy: Secondary | ICD-10-CM | POA: Diagnosis not present

## 2023-04-04 HISTORY — PX: RIGHT/LEFT HEART CATH AND CORONARY ANGIOGRAPHY: CATH118266

## 2023-04-04 LAB — POCT I-STAT EG7
Acid-Base Excess: 1 mmol/L (ref 0.0–2.0)
Bicarbonate: 29.2 mmol/L — ABNORMAL HIGH (ref 20.0–28.0)
Calcium, Ion: 1.25 mmol/L (ref 1.15–1.40)
HCT: 35 % — ABNORMAL LOW (ref 39.0–52.0)
Hemoglobin: 11.9 g/dL — ABNORMAL LOW (ref 13.0–17.0)
O2 Saturation: 70 %
Potassium: 3.9 mmol/L (ref 3.5–5.1)
Sodium: 142 mmol/L (ref 135–145)
TCO2: 31 mmol/L (ref 22–32)
pCO2, Ven: 60.1 mmHg — ABNORMAL HIGH (ref 44–60)
pH, Ven: 7.294 (ref 7.25–7.43)
pO2, Ven: 42 mmHg (ref 32–45)

## 2023-04-04 LAB — POCT I-STAT 7, (LYTES, BLD GAS, ICA,H+H)
Acid-Base Excess: 6 mmol/L — ABNORMAL HIGH (ref 0.0–2.0)
Bicarbonate: 34 mmol/L — ABNORMAL HIGH (ref 20.0–28.0)
Calcium, Ion: 1.23 mmol/L (ref 1.15–1.40)
HCT: 33 % — ABNORMAL LOW (ref 39.0–52.0)
Hemoglobin: 11.2 g/dL — ABNORMAL LOW (ref 13.0–17.0)
O2 Saturation: 96 %
Potassium: 3.8 mmol/L (ref 3.5–5.1)
Sodium: 142 mmol/L (ref 135–145)
TCO2: 36 mmol/L — ABNORMAL HIGH (ref 22–32)
pCO2 arterial: 66.4 mmHg (ref 32–48)
pH, Arterial: 7.317 — ABNORMAL LOW (ref 7.35–7.45)
pO2, Arterial: 90 mmHg (ref 83–108)

## 2023-04-04 LAB — GLUCOSE, CAPILLARY: Glucose-Capillary: 113 mg/dL — ABNORMAL HIGH (ref 70–99)

## 2023-04-04 SURGERY — RIGHT/LEFT HEART CATH AND CORONARY ANGIOGRAPHY
Anesthesia: Moderate Sedation | Laterality: Bilateral

## 2023-04-04 MED ORDER — HEPARIN (PORCINE) IN NACL 1000-0.9 UT/500ML-% IV SOLN
INTRAVENOUS | Status: DC | PRN
  Administered 2023-04-04: 1000 mL

## 2023-04-04 MED ORDER — HEPARIN SODIUM (PORCINE) 1000 UNIT/ML IJ SOLN
INTRAMUSCULAR | Status: AC
  Filled 2023-04-04: qty 10

## 2023-04-04 MED ORDER — VERAPAMIL HCL 2.5 MG/ML IV SOLN
INTRAVENOUS | Status: DC | PRN
  Administered 2023-04-04: 2.5 mg via INTRA_ARTERIAL

## 2023-04-04 MED ORDER — FENTANYL CITRATE (PF) 100 MCG/2ML IJ SOLN
INTRAMUSCULAR | Status: AC
  Filled 2023-04-04: qty 2

## 2023-04-04 MED ORDER — MIDAZOLAM HCL 2 MG/2ML IJ SOLN
INTRAMUSCULAR | Status: AC
  Filled 2023-04-04: qty 2

## 2023-04-04 MED ORDER — SODIUM CHLORIDE 0.9 % IV SOLN: 250.0000 mL | INTRAVENOUS | Status: DC | PRN

## 2023-04-04 MED ORDER — HEPARIN (PORCINE) IN NACL 1000-0.9 UT/500ML-% IV SOLN
INTRAVENOUS | Status: AC
  Filled 2023-04-04: qty 1000

## 2023-04-04 MED ORDER — ACETAMINOPHEN 325 MG PO TABS: 650.0000 mg | ORAL_TABLET | ORAL | Status: DC | PRN

## 2023-04-04 MED ORDER — NITROGLYCERIN 1 MG/10 ML FOR IR/CATH LAB
INTRA_ARTERIAL | Status: AC
  Filled 2023-04-04: qty 10

## 2023-04-04 MED ORDER — HEPARIN (PORCINE) IN NACL 1000-0.9 UT/500ML-% IV SOLN
INTRAVENOUS | Status: AC
  Filled 2023-04-04: qty 500

## 2023-04-04 MED ORDER — SODIUM CHLORIDE 0.9% FLUSH: 3.0000 mL | Freq: Two times a day (BID) | INTRAVENOUS | Status: DC

## 2023-04-04 MED ORDER — ONDANSETRON HCL 4 MG/2ML IJ SOLN: 4.0000 mg | Freq: Four times a day (QID) | INTRAMUSCULAR | Status: DC | PRN

## 2023-04-04 MED ORDER — NITROGLYCERIN 1 MG/10 ML FOR IR/CATH LAB
INTRA_ARTERIAL | Status: DC | PRN
  Administered 2023-04-04: 200 ug via INTRACORONARY

## 2023-04-04 MED ORDER — SODIUM CHLORIDE 0.9% FLUSH: 3.0000 mL | INTRAVENOUS | Status: DC | PRN

## 2023-04-04 MED ORDER — MIDAZOLAM HCL 2 MG/2ML IJ SOLN
INTRAMUSCULAR | Status: DC | PRN
  Administered 2023-04-04: 1 mg via INTRAVENOUS

## 2023-04-04 MED ORDER — VERAPAMIL HCL 2.5 MG/ML IV SOLN
INTRAVENOUS | Status: AC
  Filled 2023-04-04: qty 2

## 2023-04-04 MED ORDER — SODIUM CHLORIDE 0.9 % IV SOLN: INTRAVENOUS | Status: DC

## 2023-04-04 MED ORDER — HEPARIN SODIUM (PORCINE) 1000 UNIT/ML IJ SOLN
INTRAMUSCULAR | Status: DC | PRN
  Administered 2023-04-04: 4500 [IU] via INTRAVENOUS

## 2023-04-04 MED ORDER — IOHEXOL 300 MG/ML  SOLN
INTRAMUSCULAR | Status: DC | PRN
  Administered 2023-04-04: 58 mL

## 2023-04-04 MED ORDER — FENTANYL CITRATE (PF) 100 MCG/2ML IJ SOLN
INTRAMUSCULAR | Status: DC | PRN
  Administered 2023-04-04: 25 ug via INTRAVENOUS

## 2023-04-04 SURGICAL SUPPLY — 12 items
CATH BALLN WEDGE 5F 110CM (CATHETERS) IMPLANT
CATH INFINITI 5FR JK (CATHETERS) IMPLANT
DEVICE RAD TR BAND REGULAR (VASCULAR PRODUCTS) IMPLANT
DRAPE BRACHIAL (DRAPES) IMPLANT
GLIDESHEATH SLEND SS 6F .021 (SHEATH) IMPLANT
GUIDEWIRE INQWIRE 1.5J.035X260 (WIRE) IMPLANT
INQWIRE 1.5J .035X260CM (WIRE) ×1
PACK CARDIAC CATH (CUSTOM PROCEDURE TRAY) ×1 IMPLANT
PROTECTION STATION PRESSURIZED (MISCELLANEOUS) ×1
SET ATX-X65L (MISCELLANEOUS) IMPLANT
SHEATH GLIDE SLENDER 4/5FR (SHEATH) IMPLANT
STATION PROTECTION PRESSURIZED (MISCELLANEOUS) IMPLANT

## 2023-04-04 NOTE — Interval H&P Note (Signed)
History and Physical Interval Note:  04/04/2023 9:42 AM  Leslie Sims  has presented today for surgery, with the diagnosis of R and L Cath   Coronary artery disease.  The various methods of treatment have been discussed with the patient and family. After consideration of risks, benefits and other options for treatment, the patient has consented to  Procedure(s): RIGHT/LEFT HEART CATH AND CORONARY ANGIOGRAPHY (Bilateral) as a surgical intervention.  The patient's history has been reviewed, patient examined, no change in status, stable for surgery.  I have reviewed the patient's chart and labs.  Questions were answered to the patient's satisfaction.     Lorine Bears

## 2023-04-05 ENCOUNTER — Encounter: Payer: Self-pay | Admitting: Cardiovascular Disease

## 2023-04-06 ENCOUNTER — Telehealth: Payer: Self-pay | Admitting: Pulmonary Disease

## 2023-04-06 ENCOUNTER — Encounter: Payer: Self-pay | Admitting: *Deleted

## 2023-04-06 DIAGNOSIS — J449 Chronic obstructive pulmonary disease, unspecified: Secondary | ICD-10-CM

## 2023-04-06 NOTE — Progress Notes (Signed)
Pulmonary Individual Treatment Plan  Patient Details  Name: Leslie Sims MRN: [id number] Date of Birth: 05/30/1951 Referring Provider:   Flowsheet Row Pulmonary Rehab from 03/28/2023 in [facility] Cardiac and Pulmonary Rehab  Referring Provider [name]       Initial Encounter Date:  Flowsheet Row Pulmonary Rehab from 03/28/2023 in [facility] Cardiac and Pulmonary Rehab  Date 03/28/23       Visit Diagnosis: Stage 4 very severe COPD by GOLD classification  Patient's Home Medications on Admission:  Current Outpatient Medications:    albuterol (VENTOLIN HFA) 108 (90 Base) MCG/ACT inhaler, Inhale 2 puffs into the lungs every 6 (six) hours as needed for wheezing or shortness of breath., Disp: 8 g, Rfl: 2   ALPRAZolam (XANAX) 0.5 MG tablet, Take 0.5 mg by mouth 3 (three) times daily., Disp: , Rfl:    aspirin 81 MG tablet, Take 81 mg by mouth daily., Disp: , Rfl:    atorvastatin (LIPITOR) 80 MG tablet, Take 1 tablet (80 mg total) by mouth daily., Disp: 90 tablet, Rfl: 3   budesonide (PULMICORT) 0.5 MG/2ML nebulizer solution, Take 2 mLs (0.5 mg total) by nebulization in the morning and at bedtime., Disp: 320 mL, Rfl: 12   fluticasone (FLONASE) 50 MCG/ACT nasal spray, Place 2 sprays into both nostrils daily., Disp: , Rfl:    furosemide (LASIX) 20 MG tablet, Take 1 tablet (20 mg total) by mouth 2 (two) times daily for 5 days. (Patient taking differently: Take 20 mg by mouth daily.), Disp: 10 tablet, Rfl: 0   ipratropium-albuterol (DUONEB) 0.5-2.5 (3) MG/3ML SOLN, Take 3 mLs by nebulization 2 (two) times daily., Disp: , Rfl:    ipratropium-albuterol (DUONEB) 0.5-2.5 (3) MG/3ML SOLN, Take 3 mLs by nebulization every 6 (six) hours as needed., Disp: 360 mL, Rfl: 11   isosorbide mononitrate (IMDUR) 30 MG 24 hr tablet, Take 1 tablet (30 mg total) by mouth daily., Disp: 90 tablet, Rfl: 3   ketoconazole (NIZORAL) 2 % cream, Apply topically 2 (two) times daily., Disp: , Rfl:    lansoprazole (PREVACID) 30 MG  capsule, Take 1 capsule (30 mg total) by mouth 2 (two) times daily before a meal., Disp: 180 capsule, Rfl: 1   loratadine (CLARITIN) 10 MG tablet, Take 10 mg by mouth daily., Disp: , Rfl:    losartan (COZAAR) 25 MG tablet, Take 1 tablet (25 mg total) by mouth daily., Disp: 90 tablet, Rfl: 3   mirtazapine (REMERON) 45 MG tablet, Take 45 mg by mouth at bedtime., Disp: , Rfl:    Multiple Vitamin (MULTIVITAMIN) capsule, Take 1 capsule by mouth daily., Disp: , Rfl:    nitroGLYCERIN (NITROSTAT) 0.4 MG SL tablet, Place 1 tablet (0.4 mg total) under the tongue every 5 (five) minutes as needed., Disp: 25 tablet, Rfl: 3   OXYGEN, Inhale 3 L into the lungs daily., Disp: , Rfl:    Spacer/Aero-Holding Chambers (AEROCHAMBER MV) inhaler, Use as instructed, Disp: 1 each, Rfl: 2  Past Medical History: Past Medical History:  Diagnosis Date   Allergic rhinitis    Anxiety    Back pain    Cancer    prostate   CHF (congestive heart failure)    Colon polyps 08/2012   colonoscopy; multiple colon polyps; repeat colonoscopy in one year.   Complication of anesthesia    COPD (chronic obstructive pulmonary disease)    a. on home O2.   Coronary artery disease 11/2009   a. late presenting ant MI; b. LHC 100% pLAD s/p PCI/DES, 99% mRCA  s/p PCI/DES, EF 35%; c. nuclear stress test 05/13: prior ant/inf infarcts w/o ischemia, EF 43%; d. LHC 02/14: widely patent stents with no other obs dz, EF 40%; e. LHC 11/17: LM nl, mLAD 10%, patent LAD stent, dLAD 20%, p-mRCA 10%, mRCA 40%, patent RCA stent, EF 35-45%    Depression    Diabetes    Dysphagia    Family history of adverse reaction to anesthesia    GERD (gastroesophageal reflux disease)    Headache(784.0)    Helicobacter pylori (H. pylori)    Hemorrhoids    Hernia    inguinal   HFrEF (heart failure with reduced ejection fraction)    a. 10/2016 LV gram: EF 35-45%; b. 06/2018 Echo: EF 40-45%. c. 01/2021 Echo: EF 40-45%   Hyperlipidemia    Hypertension    Insomnia     Ischemic cardiomyopathy    a. 10/2016 LV gram: EF 35-45%; b. 06/2018 Echo: EF 40-45%.   Myalgia    Oxygen dependent    Peptic ulcer    Pulmonary nodules    ST elevation (STEMI) myocardial infarction involving left anterior descending coronary artery 11/2009   a. s/p PCI to the LAD   Status post dilation of esophageal narrowing 2000   Thickening of esophagus    Tinea pedis    Wears dentures    partial upper    Tobacco Use: Social History   Tobacco Use  Smoking Status Former   Packs/day: 3.00   Years: 42.00   Additional pack years: 0.00   Total pack years: 126.00   Types: Cigarettes   Quit date: 02/10/2019   Years since quitting: 4.1  Smokeless Tobacco Never  Tobacco Comments   25 months ago most smoked 4 packs a day .    Labs: Review Flowsheet  More data exists      Latest Ref Rng & Units 01/30/2019 04/10/2020 03/18/2021 01/26/2023 04/04/2023  Labs for ITP Cardiac and Pulmonary Rehab  Cholestrol 0 - 200 mg/dL - - 657  846  -  LDL (calc) 0 - 99 mg/dL - - 81  47  -  HDL-C >96 mg/dL - - 69  87  -  Trlycerides <150 mg/dL - - 77  295  -  Hemoglobin A1c 4.8 - 5.6 % 6.2  - - - -  PH, Arterial 7.35 - 7.45 - - - - 7.317   PCO2 arterial 32 - 48 mmHg - - - - 66.4   Bicarbonate 20.0 - 28.0 mmol/L - 31.3  - - 34.0  29.2   TCO2 22 - 32 mmol/L - - - - 36  31   O2 Saturation % - 92.4  - - 96  70      Pulmonary Assessment Scores:  Pulmonary Assessment Scores     Row Name 03/28/23 1702         ADL UCSD   ADL Phase Entry     SOB Score total 77     Rest 1     Walk 3     Stairs 4     Bath 4     Dress 3     Shop 4       CAT Score   CAT Score 21       mMRC Score   mMRC Score 3              UCSD: Self-administered rating of dyspnea associated with activities of daily living (ADLs) 6-point scale (0 = "not at all" to 5 = "  maximal or unable to do because of breathlessness")  Scoring Scores range from 0 to 120.  Minimally important difference is 5 units  CAT: CAT can  identify the health impairment of COPD patients and is better correlated with disease progression.  CAT has a scoring range of zero to 40. The CAT score is classified into four groups of low (less than 10), medium (10 - 20), high (21-30) and very high (31-40) based on the impact level of disease on health status. A CAT score over 10 suggests significant symptoms.  A worsening CAT score could be explained by an exacerbation, poor medication adherence, poor inhaler technique, or progression of COPD or comorbid conditions.  CAT MCID is 2 points  mMRC: mMRC (Modified Medical Research Council) Dyspnea Scale is used to assess the degree of baseline functional disability in patients of respiratory disease due to dyspnea. No minimal important difference is established. A decrease in score of 1 point or greater is considered a positive change.   Pulmonary Function Assessment:  Pulmonary Function Assessment - 03/28/23 1651       Pulmonary Function Tests   FVC% 40 %    FEV1% 20 %   post bronchodilator   FEV1/FVC Ratio 37             Exercise Target Goals: Exercise Program Goal: Individual exercise prescription set using results from initial 6 min walk test and THRR while considering  patient's activity barriers and safety.   Exercise Prescription Goal: Initial exercise prescription builds to 30-45 minutes a day of aerobic activity, 2-3 days per week.  Home exercise guidelines will be given to patient during program as part of exercise prescription that the participant will acknowledge.  Education: Aerobic Exercise: - Group verbal and visual presentation on the components of exercise prescription. Introduces F.I.T.T principle from ACSM for exercise prescriptions.  Reviews F.I.T.T. principles of aerobic exercise including progression. Written material given at graduation.   Education: Resistance Exercise: - Group verbal and visual presentation on the components of exercise prescription.  Introduces F.I.T.T principle from ACSM for exercise prescriptions  Reviews F.I.T.T. principles of resistance exercise including progression. Written material given at graduation.    Education: Exercise & Equipment Safety: - Individual verbal instruction and demonstration of equipment use and safety with use of the equipment. Flowsheet Row Pulmonary Rehab from 03/28/2023 in [facility] Cardiac and Pulmonary Rehab  Date 03/28/23  Educator [facility]  Instruction Review Code 1- Verbalizes Understanding       Education: Exercise Physiology & General Exercise Guidelines: - Group verbal and written instruction with models to review the exercise physiology of the cardiovascular system and associated critical values. Provides general exercise guidelines with specific guidelines to those with heart or lung disease.  Flowsheet Row Pulmonary Rehab from 03/28/2023 in [facility] Cardiac and Pulmonary Rehab  Education need identified 03/28/23       Education: Flexibility, Balance, Mind/Body Relaxation: - Group verbal and visual presentation with interactive activity on the components of exercise prescription. Introduces F.I.T.T principle from ACSM for exercise prescriptions. Reviews F.I.T.T. principles of flexibility and balance exercise training including progression. Also discusses the mind body connection.  Reviews various relaxation techniques to help reduce and manage stress (i.e. Deep breathing, progressive muscle relaxation, and visualization). Balance handout provided to take home. Written material given at graduation.   Activity Barriers & Risk Stratification:  Activity Barriers & Cardiac Risk Stratification - 03/28/23 1647       Activity Barriers & Cardiac Risk Stratification   Activity Barriers  Shortness of Breath             6 Minute Walk:  6 Minute Walk     Row Name 03/28/23 1644         6 Minute Walk   Phase Initial     Distance 765 feet     Walk Time 4.75 minutes     # of Rest Breaks 2      MPH 1.83     METS 1.9     RPE 13     Perceived Dyspnea  3     VO2 Peak 6.64     Symptoms No     Resting HR 87 bpm     Resting BP 122/60     Resting Oxygen Saturation  94 %     Exercise Oxygen Saturation  during 6 min walk 80 %     Max Ex. HR 116 bpm     Max Ex. BP 148/64     2 Minute Post BP 126/80       Interval HR   1 Minute HR 104     2 Minute HR 101     3 Minute HR 105     4 Minute HR 103     5 Minute HR 112     6 Minute HR 116     2 Minute Post HR 93     Interval Heart Rate? Yes       Interval Oxygen   Interval Oxygen? Yes     Baseline Oxygen Saturation % 94 %     1 Minute Oxygen Saturation % 90 %     1 Minute Liters of Oxygen 3 L     2 Minute Oxygen Saturation % 87 %     2 Minute Liters of Oxygen 3 L     3 Minute Oxygen Saturation % 81 %     3 Minute Liters of Oxygen 3 L     4 Minute Oxygen Saturation % 83 %     4 Minute Liters of Oxygen 3 L     5 Minute Oxygen Saturation % 80 %     5 Minute Liters of Oxygen 3 L     6 Minute Oxygen Saturation % 81 %     6 Minute Liters of Oxygen 3 L     2 Minute Post Oxygen Saturation % 89 %     2 Minute Post Liters of Oxygen 3 L             Oxygen Initial Assessment:  Oxygen Initial Assessment - 03/28/23 1700       Home Oxygen   Home Oxygen Device Home Concentrator;Portable Concentrator    Sleep Oxygen Prescription Continuous    Liters per minute 3    Home Exercise Oxygen Prescription Continuous    Liters per minute 3    Home Resting Oxygen Prescription Continuous    Liters per minute 3    Compliance with Home Oxygen Use Yes      Initial 6 min Walk   Oxygen Used Continuous;E-Tanks    Liters per minute 3      Program Oxygen Prescription   Program Oxygen Prescription Continuous;E-Tanks    Liters per minute 3      Intervention   Short Term Goals To learn and exhibit compliance with exercise, home and travel O2 prescription;To learn and understand importance of monitoring SPO2 with pulse oximeter and  demonstrate accurate use of the pulse oximeter.;To learn  and understand importance of maintaining oxygen saturations>88%;To learn and demonstrate proper pursed lip breathing techniques or other breathing techniques. ;To learn and demonstrate proper use of respiratory medications    Long  Term Goals Exhibits compliance with exercise, home  and travel O2 prescription;Verbalizes importance of monitoring SPO2 with pulse oximeter and return demonstration;Maintenance of O2 saturations>88%;Exhibits proper breathing techniques, such as pursed lip breathing or other method taught during program session;Compliance with respiratory medication;Demonstrates proper use of MDI's             Oxygen Re-Evaluation:   Oxygen Discharge (Final Oxygen Re-Evaluation):   Initial Exercise Prescription:  Initial Exercise Prescription - 03/28/23 1600       Date of Initial Exercise RX and Referring Provider   Date 03/28/23    Referring Provider [name]      Oxygen   Oxygen Continuous    Liters 3    Maintain Oxygen Saturation 88% or higher      Treadmill   MPH 11.4    Grade 0    Minutes 15    METs 2.02      Recumbant Bike   Level 1    RPM 50    Watts 15    Minutes 15      NuStep   Level 1    SPM 80    Minutes 15    METs 1.9      Arm Ergometer   Level 1    RPM 30    Minutes 15    METs 1.9      REL-XR   Level 1    Speed 50    Minutes 15    METs 1.9      Track   Laps 20    Minutes 15    METs 2.09      Prescription Details   Frequency (times per week) 3    Duration Progress to 30 minutes of continuous aerobic without signs/symptoms of physical distress      Intensity   THRR 40-80% of Max Heartrate 112-130    Ratings of Perceived Exertion 11-13    Perceived Dyspnea 0-4      Progression   Progression Continue to progress workloads to maintain intensity without signs/symptoms of physical distress.      Resistance Training   Training Prescription Yes    Weight 5    Reps  10-15             Perform Capillary Blood Glucose checks as needed.  Exercise Prescription Changes:   Exercise Prescription Changes     Row Name 03/28/23 1600             Response to Exercise   Blood Pressure (Admit) 122/60       Blood Pressure (Exercise) 148/64       Blood Pressure (Exit) 126/80       Heart Rate (Admit) 87 bpm       Heart Rate (Exercise) 116 bpm       Heart Rate (Exit) 93 bpm       Oxygen Saturation (Admit) 94 %       Oxygen Saturation (Exercise) 80 %       Oxygen Saturation (Exit) 95 %       Rating of Perceived Exertion (Exercise) 13       Perceived Dyspnea (Exercise) 3       Symptoms none       Comments 6 MWT resutls  Exercise Comments:   Exercise Goals and Review:   Exercise Goals     Row Name 03/28/23 1656             Exercise Goals   Increase Physical Activity Yes       Intervention Provide advice, education, support and counseling about physical activity/exercise needs.;Develop an individualized exercise prescription for aerobic and resistive training based on initial evaluation findings, risk stratification, comorbidities and participant's personal goals.       Expected Outcomes Short Term: Attend rehab on a regular basis to increase amount of physical activity.;Long Term: Add in home exercise to make exercise part of routine and to increase amount of physical activity.;Long Term: Exercising regularly at least 3-5 days a week.       Increase Strength and Stamina Yes       Intervention Provide advice, education, support and counseling about physical activity/exercise needs.;Develop an individualized exercise prescription for aerobic and resistive training based on initial evaluation findings, risk stratification, comorbidities and participant's personal goals.       Expected Outcomes Short Term: Increase workloads from initial exercise prescription for resistance, speed, and METs.;Short Term: Perform resistance training  exercises routinely during rehab and add in resistance training at home;Long Term: Improve cardiorespiratory fitness, muscular endurance and strength as measured by increased METs and functional capacity ( )       Able to understand and use rate of perceived exertion (RPE) scale Yes       Intervention Provide education and explanation on how to use RPE scale       Expected Outcomes Short Term: Able to use RPE daily in rehab to express subjective intensity level       Able to understand and use Dyspnea scale Yes       Intervention Provide education and explanation on how to use Dyspnea scale       Expected Outcomes Short Term: Able to use Dyspnea scale daily in rehab to express subjective sense of shortness of breath during exertion;Long Term: Able to use Dyspnea scale to guide intensity level when exercising independently       Knowledge and understanding of Target Heart Rate Range (THRR) Yes       Intervention Provide education and explanation of THRR including how the numbers were predicted and where they are located for reference       Expected Outcomes Short Term: Able to state/look up THRR;Long Term: Able to use THRR to govern intensity when exercising independently;Short Term: Able to use daily as guideline for intensity in rehab       Able to check pulse independently Yes       Intervention Provide education and demonstration on how to check pulse in carotid and radial arteries.;Review the importance of being able to check your own pulse for safety during independent exercise       Expected Outcomes Short Term: Able to explain why pulse checking is important during independent exercise;Long Term: Able to check pulse independently and accurately       Understanding of Exercise Prescription Yes       Intervention Provide education, explanation, and written materials on patient's individual exercise prescription       Expected Outcomes Short Term: Able to explain program exercise  prescription;Long Term: Able to explain home exercise prescription to exercise independently                Exercise Goals Re-Evaluation :   Discharge Exercise Prescription (Final Exercise Prescription Changes):  Exercise Prescription Changes - 03/28/23 1600       Response to Exercise   Blood Pressure (Admit) 122/60    Blood Pressure (Exercise) 148/64    Blood Pressure (Exit) 126/80    Heart Rate (Admit) 87 bpm    Heart Rate (Exercise) 116 bpm    Heart Rate (Exit) 93 bpm    Oxygen Saturation (Admit) 94 %    Oxygen Saturation (Exercise) 80 %    Oxygen Saturation (Exit) 95 %    Rating of Perceived Exertion (Exercise) 13    Perceived Dyspnea (Exercise) 3    Symptoms none    Comments 6 MWT resutls             Nutrition:  Target Goals: Understanding of nutrition guidelines, daily intake of sodium 1500mg , cholesterol 200mg , calories 30% from fat and 7% or less from saturated fats, daily to have 5 or more servings of fruits and vegetables.  Education: All About Nutrition: -Group instruction provided by verbal, written material, interactive activities, discussions, models, and posters to present general guidelines for heart healthy nutrition including fat, fiber, MyPlate, the role of sodium in heart healthy nutrition, utilization of the nutrition label, and utilization of this knowledge for meal planning. Follow up email sent as well. Written material given at graduation. Flowsheet Row Pulmonary Rehab from 03/28/2023 in [facility] Cardiac and Pulmonary Rehab  Education need identified 03/28/23       Biometrics:  Pre Biometrics - 03/28/23 1657       Pre Biometrics   Height 5\' 4"  (1.626 m)    Weight 200 lb 9.6 oz (91 kg)    Waist Circumference 45 inches    Hip Circumference 43 inches    Waist to Hip Ratio 1.05 %    BMI (Calculated) 34.42    Single Leg Stand 8.06 seconds              Nutrition Therapy Plan and Nutrition Goals:  Nutrition Therapy & Goals - 03/28/23  1818       Intervention Plan   Intervention Prescribe, educate and counsel regarding individualized specific dietary modifications aiming towards targeted core components such as weight, hypertension, lipid management, diabetes, heart failure and other comorbidities.    Expected Outcomes Short Term Goal: Understand basic principles of dietary content, such as calories, fat, sodium, cholesterol and nutrients.;Short Term Goal: A plan has been developed with personal nutrition goals set during dietitian appointment.;Long Term Goal: Adherence to prescribed nutrition plan.             Nutrition Assessments:  MEDIFICTS Score Key: ?70 Need to make dietary changes  40-70 Heart Healthy Diet ? 40 Therapeutic Level Cholesterol Diet  Flowsheet Row Pulmonary Rehab from 03/28/2023 in [facility] Cardiac and Pulmonary Rehab  Picture Your Plate Total Score on Admission 53      Picture Your Plate Scores: <16 Unhealthy dietary pattern with much room for improvement. 41-50 Dietary pattern unlikely to meet recommendations for good health and room for improvement. 51-60 More healthful dietary pattern, with some room for improvement.  >60 Healthy dietary pattern, although there may be some specific behaviors that could be improved.   Nutrition Goals Re-Evaluation:   Nutrition Goals Discharge (Final Nutrition Goals Re-Evaluation):   Psychosocial: Target Goals: Acknowledge presence or absence of significant depression and/or stress, maximize coping skills, provide positive support system. Participant is able to verbalize types and ability to use techniques and skills needed for reducing stress and depression.   Education: Stress, Anxiety, and Depression -  Group verbal and visual presentation to define topics covered.  Reviews how body is impacted by stress, anxiety, and depression.  Also discusses healthy ways to reduce stress and to treat/manage anxiety and depression.  Written material given at  graduation. Flowsheet Row Pulmonary Rehab from 03/31/2018 in [facility] Cardiac and Pulmonary Rehab  Date 02/08/18  Educator [facility]  Instruction Review Code 1- [employer] Understanding       Education: Sleep Hygiene -Provides group verbal and written instruction about how sleep can affect your health.  Define sleep hygiene, discuss sleep cycles and impact of sleep habits. Review good sleep hygiene tips.    Initial Review & Psychosocial Screening:  Initial Psych Review & Screening - 03/21/23 1116       Initial Review   Current issues with Current Psychotropic Meds;Current Depression;Current Anxiety/Panic      Family Dynamics   Good Support System? Yes   daughters  live close, brother in law takes him to appts     Barriers   Psychosocial barriers to participate in program There are no identifiable barriers or psychosocial needs.      Screening Interventions   Interventions Encouraged to exercise;To provide support and resources with identified psychosocial needs;Provide feedback about the scores to participant    Expected Outcomes Short Term goal: Utilizing psychosocial counselor, staff and physician to assist with identification of specific Stressors or current issues interfering with healing process. Setting desired goal for each stressor or current issue identified.;Long Term Goal: Stressors or current issues are controlled or eliminated.;Short Term goal: Identification and review with participant of any Quality of Life or Depression concerns found by scoring the questionnaire.;Long Term goal: The participant improves quality of Life and PHQ9 Scores as seen by post scores and/or verbalization of changes             Quality of Life Scores:  Scores of 19 and below usually indicate a poorer quality of life in these areas.  A difference of  2-3 points is a clinically meaningful difference.  A difference of 2-3 points in the total score of the Quality of Life Index has been associated with  significant improvement in overall quality of life, self-image, physical symptoms, and general health in studies assessing change in quality of life.  PHQ-9: Review Flowsheet  More data exists      03/28/2023 07/07/2018 05/22/2018 04/19/2018 03/03/2018  Depression screen PHQ 2/9  Decreased Interest 3 2 2 3 3   Down, Depressed, Hopeless 3 2 2 3 3   PHQ - 2 Score 6 4 4 6 6   Altered sleeping 2 2 1 3 3   Tired, decreased energy 2 2 1 3 3   Change in appetite 2 2 1 3 1   Feeling bad or failure about yourself  2 2 1 3 1   Trouble concentrating 2 0 0 1 1  Moving slowly or fidgety/restless 2 0 0 0 1  Suicidal thoughts 0 0 0 0 1  PHQ-9 Score 18 12 8 19 17   Difficult doing work/chores Somewhat difficult - - - -   Interpretation of Total Score  Total Score Depression Severity:  1-4 = Minimal depression, 5-9 = Mild depression, 10-14 = Moderate depression, 15-19 = Moderately severe depression, 20-27 = Severe depression   Psychosocial Evaluation and Intervention:  Psychosocial Evaluation - 03/21/23 1125       Psychosocial Evaluation & Interventions   Comments Shaheed has no barriers to attending the program. He lives alone. HIs daughter and brother in law are his support. He  has history of depression/anxiety and is on medications managed by his primary physician. He wants to see if he can improve his breathing and be able to perform activities longer than he is at this time. He does wear oxygen all day and night and is compliant. He is ready to return to the program. He was here in 2019.    He is still greiving the loss of his wife in 2019;they had been married 40 years.    Expected Outcomes STG Shedrick will attend all scheduled sessions, he will continue to work on controlling his depression/anxiety symptoms. He will follow the exercise progression to work on improving his breathing. LTG Jordyn maintiais control of his depression/anxiety symptoms. He is able to be a bit more active without increased shortness  of breath.    Continue Psychosocial Services  Follow up required by staff             Psychosocial Re-Evaluation:   Psychosocial Discharge (Final Psychosocial Re-Evaluation):   Education: Education Goals: Education classes will be provided on a weekly basis, covering required topics. Participant will state understanding/return demonstration of topics presented.  Learning Barriers/Preferences:   General Pulmonary Education Topics:  Infection Prevention: - Provides verbal and written material to individual with discussion of infection control including proper hand washing and proper equipment cleaning during exercise session. Flowsheet Row Pulmonary Rehab from 03/28/2023 in [facility] Cardiac and Pulmonary Rehab  Date 03/28/23  Educator [facility]  Instruction Review Code 1- Verbalizes Understanding       Falls Prevention: - Provides verbal and written material to individual with discussion of falls prevention and safety. Flowsheet Row Pulmonary Rehab from 03/28/2023 in [facility] Cardiac and Pulmonary Rehab  Date 03/28/23  Educator [facility]  Instruction Review Code 5- Refused Teaching       Chronic Lung Disease Review: - Group verbal instruction with posters, models, PowerPoint presentations and videos,  to review new updates, new respiratory medications, new advancements in procedures and treatments. Providing information on websites and "800" numbers for continued self-education. Includes information about supplement oxygen, available portable oxygen systems, continuous and intermittent flow rates, oxygen safety, concentrators, and Medicare reimbursement for oxygen. Explanation of Pulmonary Drugs, including class, frequency, complications, importance of spacers, rinsing mouth after steroid MDI's, and proper cleaning methods for nebulizers. Review of basic lung anatomy and physiology related to function, structure, and complications of lung disease. Review of risk factors. Discussion about methods for  diagnosing sleep apnea and types of masks and machines for OSA. Includes a review of the use of types of environmental controls: home humidity, furnaces, filters, dust mite/pet prevention, HEPA vacuums. Discussion about weather changes, air quality and the benefits of nasal washing. Instruction on Warning signs, infection symptoms, calling MD promptly, preventive modes, and value of vaccinations. Review of effective airway clearance, coughing and/or vibration techniques. Emphasizing that all should Create an Action Plan. Written material given at graduation. Flowsheet Row Pulmonary Rehab from 03/28/2023 in [facility] Cardiac and Pulmonary Rehab  Education need identified 03/28/23       AED/CPR: - Group verbal and written instruction with the use of models to demonstrate the basic use of the AED with the basic ABC's of resuscitation. Flowsheet Row Pulmonary Rehab from 03/31/2018 in [facility] Cardiac and Pulmonary Rehab  Date 02/17/18  Educator [facility]  Instruction Review Code 1- Verbalizes Understanding        Anatomy and Cardiac Procedures: - Group verbal and visual presentation and models provide information about basic cardiac anatomy and function. Reviews the  testing methods done to diagnose heart disease and the outcomes of the test results. Describes the treatment choices: Medical Management, Angioplasty, or Coronary Bypass Surgery for treating various heart conditions including Myocardial Infarction, Angina, Valve Disease, and Cardiac Arrhythmias.  Written material given at graduation.   Medication Safety: - Group verbal and visual instruction to review commonly prescribed medications for heart and lung disease. Reviews the medication, class of the drug, and side effects. Includes the steps to properly store meds and maintain the prescription regimen.  Written material given at graduation. Flowsheet Row Pulmonary Rehab from 03/31/2018 in [facility] Cardiac and Pulmonary Rehab  Date 02/03/18  Educator [facility]   Instruction Review Code 1- Verbalizes Understanding       Other: -Provides group and verbal instruction on various topics (see comments)   Knowledge Questionnaire Score:  Knowledge Questionnaire Score - 03/28/23 1702       Knowledge Questionnaire Score   Pre Score 12/18              Core Components/Risk Factors/Patient Goals at Admission:  Personal Goals and Risk Factors at Admission - 03/28/23 1657       Core Components/Risk Factors/Patient Goals on Admission    Weight Management Yes    Intervention Weight Management: Develop a combined nutrition and exercise program designed to reach desired caloric intake, while maintaining appropriate intake of nutrient and fiber, sodium and fats, and appropriate energy expenditure required for the weight goal.;Weight Management: Provide education and appropriate resources to help participant work on and attain dietary goals.;Weight Management/Obesity: Establish reasonable short term and long term weight goals.    Admit Weight 200 lb 9.6 oz (91 kg)    Goal Weight: Short Term 190 lb (86.2 kg)    Goal Weight: Long Term 175 lb (79.4 kg)    Expected Outcomes Short Term: Continue to assess and modify interventions until short term weight is achieved;Long Term: Adherence to nutrition and physical activity/exercise program aimed toward attainment of established weight goal;Weight Loss: Understanding of general recommendations for a balanced deficit meal plan, which promotes 1-2 lb weight loss per week and includes a negative energy balance of [phone] kcal/d;Understanding recommendations for meals to include 15-35% energy as protein, 25-35% energy from fat, 35-60% energy from carbohydrates, less than 200mg  of dietary cholesterol, 20-35 gm of total fiber daily;Understanding of distribution of calorie intake throughout the day with the consumption of 4-5 meals/snacks    Improve shortness of breath with ADL's Yes    Intervention Provide education,  individualized exercise plan and daily activity instruction to help decrease symptoms of SOB with activities of daily living.    Expected Outcomes Short Term: Improve cardiorespiratory fitness to achieve a reduction of symptoms when performing ADLs;Long Term: Be able to perform more ADLs without symptoms or delay the onset of symptoms    Increase knowledge of respiratory medications and ability to use respiratory devices properly  Yes    Intervention Provide education and demonstration as needed of appropriate use of medications, inhalers, and oxygen therapy.    Expected Outcomes Short Term: Achieves understanding of medications use. Understands that oxygen is a medication prescribed by physician. Demonstrates appropriate use of inhaler and oxygen therapy.;Long Term: Maintain appropriate use of medications, inhalers, and oxygen therapy.    Diabetes Yes   no meds, getting A1C checked soon to confirm proper diagnosis   Intervention Provide education about signs/symptoms and action to take for hypo/hyperglycemia.;Provide education about proper nutrition, including hydration, and aerobic/resistive exercise prescription along with prescribed medications  to achieve blood glucose in normal ranges: Fasting glucose 65-99 mg/dL    Expected Outcomes Short Term: Participant verbalizes understanding of the signs/symptoms and immediate care of hyper/hypoglycemia, proper foot care and importance of medication, aerobic/resistive exercise and nutrition plan for blood glucose control.;Long Term: Attainment of HbA1C < 7%.    Heart Failure Yes    Intervention Provide a combined exercise and nutrition program that is supplemented with education, support and counseling about heart failure. Directed toward relieving symptoms such as shortness of breath, decreased exercise tolerance, and extremity edema.    Expected Outcomes Improve functional capacity of life;Short term: Attendance in program 2-3 days a week with increased  exercise capacity. Reported lower sodium intake. Reported increased fruit and vegetable intake. Reports medication compliance.;Short term: Daily weights obtained and reported for increase. Utilizing diuretic protocols set by physician.;Long term: Adoption of self-care skills and reduction of barriers for early signs and symptoms recognition and intervention leading to self-care maintenance.    Hypertension Yes    Intervention Provide education on lifestyle modifcations including regular physical activity/exercise, weight management, moderate sodium restriction and increased consumption of fresh fruit, vegetables, and low fat dairy, alcohol moderation, and smoking cessation.;Monitor prescription use compliance.    Expected Outcomes Short Term: Continued assessment and intervention until BP is < 140/42mm HG in hypertensive participants. < 130/32mm HG in hypertensive participants with diabetes, heart failure or chronic kidney disease.;Long Term: Maintenance of blood pressure at goal levels.    Lipids Yes    Intervention Provide education and support for participant on nutrition & aerobic/resistive exercise along with prescribed medications to achieve LDL 70mg , HDL >40mg .    Expected Outcomes Short Term: Participant states understanding of desired cholesterol values and is compliant with medications prescribed. Participant is following exercise prescription and nutrition guidelines.;Long Term: Cholesterol controlled with medications as prescribed, with individualized exercise RX and with personalized nutrition plan. Value goals: LDL < , HDL > 40 mg.    Stress Yes    Intervention Offer individual and/or small group education and counseling on adjustment to heart disease, stress management and health-related lifestyle change. Teach and support self-help strategies.;Refer participants experiencing significant psychosocial distress to appropriate mental health specialists for further evaluation and treatment.  When possible, include family members and significant others in education/counseling sessions.    Expected Outcomes Short Term: Participant demonstrates changes in health-related behavior, relaxation and other stress management skills, ability to obtain effective social support, and compliance with psychotropic medications if prescribed.;Long Term: Emotional wellbeing is indicated by absence of clinically significant psychosocial distress or social isolation.             Education:Diabetes - Individual verbal and written instruction to review signs/symptoms of diabetes, desired ranges of glucose level fasting, after meals and with exercise. Acknowledge that pre and post exercise glucose checks will be done for 3 sessions at entry of program.   Know Your Numbers and Heart Failure: - Group verbal and visual instruction to discuss disease risk factors for cardiac and pulmonary disease and treatment options.  Reviews associated critical values for Overweight/Obesity, Hypertension, Cholesterol, and Diabetes.  Discusses basics of heart failure: signs/symptoms and treatments.  Introduces Heart Failure Zone chart for action plan for heart failure.  Written material given at graduation.   Core Components/Risk Factors/Patient Goals Review:    Core Components/Risk Factors/Patient Goals at Discharge (Final Review):    ITP Comments:  ITP Comments     Row Name 03/21/23 1132 03/28/23 1643 03/29/23 1132 04/06/23 1610  ITP Comments Virtual orientation call completed today. he has an appointment on Date: 03/28/2023  for EP eval and gym Orientation.  Documentation of diagnosis can be found in [facility] Date: 03/01/2023 . Completed and gym orientation. Initial ITP created and sent for review to Dr. Jinny Sanders, Medical Director. Patient called to let us know he is scheduled for a heart cath on 4/15, and has to delay his start date with pulmonary rehab with [place]. Patient will need clearance to return-  patient aware and will call us to let us know when he is able to return with clearance. 30 day review completed. ITP sent to Dr. Jinny Sanders, Medical Director of  Pulmonary Rehab. Continue with ITP unless changes are made by physician.  Delayed start, no exercise sessions yet.             Comments: 30 day review

## 2023-04-06 NOTE — Telephone Encounter (Signed)
I have faxed the script back and notified the patient.

## 2023-04-06 NOTE — Telephone Encounter (Signed)
Pt states [facility] told him they have faxed a request for a RX for his nebulizer meds 2x and Dr. Reece Agar hasn't returned it.

## 2023-04-06 NOTE — Telephone Encounter (Signed)
The fax is in your folder for signature.

## 2023-04-07 ENCOUNTER — Other Ambulatory Visit: Payer: Self-pay

## 2023-04-07 ENCOUNTER — Emergency Department: Payer: Medicare Other

## 2023-04-07 ENCOUNTER — Emergency Department
Admission: EM | Admit: 2023-04-07 | Discharge: 2023-04-07 | Disposition: A | Discharge status: Home / Self Care | Payer: Medicare Other | Attending: Emergency Medicine | Admitting: Emergency Medicine

## 2023-04-07 DIAGNOSIS — I509 Heart failure, unspecified: Secondary | ICD-10-CM | POA: Diagnosis not present

## 2023-04-07 DIAGNOSIS — Z87891 Personal history of nicotine dependence: Secondary | ICD-10-CM | POA: Diagnosis not present

## 2023-04-07 DIAGNOSIS — R0602 Shortness of breath: Secondary | ICD-10-CM | POA: Diagnosis present

## 2023-04-07 DIAGNOSIS — Z9981 Dependence on supplemental oxygen: Secondary | ICD-10-CM | POA: Insufficient documentation

## 2023-04-07 DIAGNOSIS — J441 Chronic obstructive pulmonary disease with (acute) exacerbation: Secondary | ICD-10-CM | POA: Insufficient documentation

## 2023-04-07 LAB — CBC
HCT: 41.2 % (ref 39.0–52.0)
Hemoglobin: 13.3 g/dL (ref 13.0–17.0)
MCH: 30.6 pg (ref 26.0–34.0)
MCHC: 32.3 g/dL (ref 30.0–36.0)
MCV: 94.7 fL (ref 80.0–100.0)
Platelets: 225 10*3/uL (ref 150–400)
RBC: 4.35 MIL/uL (ref 4.22–5.81)
RDW: 12.5 % (ref 11.5–15.5)
WBC: 4.3 10*3/uL (ref 4.0–10.5)
nRBC: 0 % (ref 0.0–0.2)

## 2023-04-07 LAB — BASIC METABOLIC PANEL
Anion gap: 11 (ref 5–15)
BUN: 14 mg/dL (ref 8–23)
CO2: 27 mmol/L (ref 22–32)
Calcium: 8.9 mg/dL (ref 8.9–10.3)
Chloride: 102 mmol/L (ref 98–111)
Creatinine, Ser: 0.95 mg/dL (ref 0.61–1.24)
GFR, Estimated: >60 mL/min (ref >60)
Glucose, Bld: 123 mg/dL — ABNORMAL HIGH (ref 70–99)
Potassium: 4.1 mmol/L (ref 3.5–5.1)
Sodium: 140 mmol/L (ref 135–145)

## 2023-04-07 LAB — TROPONIN I (HIGH SENSITIVITY): Troponin I (High Sensitivity): 8 ng/L (ref <18)

## 2023-04-07 LAB — BRAIN NATRIURETIC PEPTIDE: B Natriuretic Peptide: 91.3 pg/mL (ref 0.0–100.0)

## 2023-04-07 MED ORDER — IPRATROPIUM-ALBUTEROL 0.5-2.5 (3) MG/3ML IN SOLN
9.0000 mL | Freq: Once | RESPIRATORY_TRACT | Status: AC
  Administered 2023-04-07: 9 mL via RESPIRATORY_TRACT
  Filled 2023-04-07: qty 3

## 2023-04-07 MED ORDER — PREDNISONE 20 MG PO TABS
60.0000 mg | ORAL_TABLET | Freq: Once | ORAL | Status: AC
  Administered 2023-04-07: 60 mg via ORAL
  Filled 2023-04-07: qty 3

## 2023-04-07 MED ORDER — PREDNISONE 50 MG PO TABS: ORAL_TABLET | ORAL | 0 refills | Status: DC

## 2023-04-07 NOTE — ED Triage Notes (Addendum)
Pt comes via EMs from home with c/o sob. Pt states neg cath on Monday. Pt has hx of copd. Pt states sob and burning in chest yesterday. Pt given 4 aspirin and refused nitro.   Pt on fluid pills too. Pt on 3L [place]

## 2023-04-07 NOTE — ED Provider Notes (Signed)
[facility] Provider Note    Event Date/Time   First MD Initiated Contact with Patient 04/07/23 1106     (approximate)   History   Shortness of Breath   HPI  Leslie Sims is a 72 y.o. male   Past medical history of former smoker, COPD, CHF, who presents to the emergency department with several days of exertional dyspnea and mild cough.  No fever or chills.  A tight burning sensation across the chest but none currently.  No orthopnea or swelling.  No other acute medical complaints.  Baseline oxygen at home without an increase in his requirements.   External Medical Documents Reviewed: heart cath on 04/04/23 w mild CAD and evidence of cardiomyopathy c/w CHF      Physical Exam   Triage Vital Signs: ED Triage Vitals [04/07/23 0939]  Enc Vitals Group     BP 117/79     Pulse Rate 87     Resp 20     Temp 98.3 F (36.8 C)     Temp src      SpO2 95 %     Weight      Height      Head Circumference      Peak Flow      Pain Score      Pain Loc      Pain Edu?      Excl. in GC?     Most recent vital signs: Vitals:   04/07/23 1100 04/07/23 1130  BP: 109/64 (!) 130/104  Pulse: 85 89  Resp: 12 (!) 22  Temp:    SpO2: 100% 94%    General: Awake, no distress.  CV:  Good peripheral perfusion.  Resp:  Normal effort.  Abd:  No distention.  Other:  No respiratory distress and normal hemodynamics and no hypoxemia on his home O2 requirements.  Mild diffuse wheezing throughout without focality.  No peripheral edema.   ED Results / Procedures / Treatments   Labs (all labs ordered are listed, but only abnormal results are displayed) Labs Reviewed  BASIC METABOLIC PANEL - Abnormal; Notable for the following components:      Result Value   Glucose, Bld 123 (*)    All other components within normal limits  CBC  BRAIN NATRIURETIC PEPTIDE  TROPONIN I (HIGH SENSITIVITY)     I ordered and reviewed the above labs they are notable for initial  troponin is 8.  EKG  ED ECG REPORT I, Pilar [name], the attending physician, personally viewed and interpreted this ECG.   Date: 04/07/2023  EKG Time: 0937  Rate: 92  Rhythm: sinus  Axis: LAD  Intervals:none  ST&T Change: no acute ischemic chnges    RADIOLOGY I independently reviewed and interpreted cxr and see no focality pneumothorax   PROCEDURES:  Critical Care performed: No  Procedures   MEDICATIONS ORDERED IN ED: Medications  ipratropium-albuterol (DUONEB) 0.5-2.5 (3) MG/3ML nebulizer solution 9 mL (9 mLs Nebulization Given 04/07/23 1154)  predniSONE (DELTASONE) tablet 60 mg (60 mg Oral Given 04/07/23 1154)     IMPRESSION / MDM / ASSESSMENT AND PLAN / ED COURSE  I reviewed the triage vital signs and the nursing notes.                                Patient's presentation is most consistent with acute presentation with potential threat to life or bodily function.  Differential  diagnosis includes, but is not limited to, exacerbation, CHF exacerbation, respiratory infection, bacterial pneumonia, ACS or PE   The patient is on the cardiac monitor to evaluate for evidence of arrhythmia and/or significant heart rate changes.  MDM: Patient with symptoms consistent with COPD exacerbation wheezing on auscultation, given DuoNebs and prednisone.  Doubt infection given clear chest x-ray no white blood cell count and afebrile.  Doubt CHF exacerbation given euvolemic appearance.  No current chest pain to suggest ACS, nonischemic EKG and initial troponin is negative and had a recent catheterization that shows no significant CAD so we will defer second troponin.    I considered hospitalization for admission or observation however given his improvement with treatment in the emergency department normal vital signs and negative otherwise workup as above, I think outpatient treatment and follow-up most appropriate this time.  He will return with any new or worsening symptoms.         FINAL CLINICAL IMPRESSION(S) / ED DIAGNOSES   Final diagnoses:  COPD exacerbation     Rx / DC Orders   ED Discharge Orders          Ordered    predniSONE (DELTASONE) 50 MG tablet        04/07/23 1217             Note:  This document was prepared using Dragon voice recognition software and may include unintentional dictation errors.    Pilar [name], MD 04/07/23 [phone]

## 2023-04-07 NOTE — ED Notes (Signed)
AVS with prescriptions provided to and discussed with patient. Pt verbalizes understanding of discharge instructions and denies any questions or concerns at this time. Pt has ride home. Pt taken out of department via W/C with O2 tank. Pt's ride is on the way with his portable O2 tank. First nurse made aware of patient.

## 2023-04-07 NOTE — Discharge Instructions (Addendum)
Take your albuterol nebulizer treatment every 4-6 hours as needed for shortness of breath and wheezing.  Take prednisone daily starting tomorrow as prescribed.  See your primary doctor for checkup this week.

## 2023-04-08 ENCOUNTER — Ambulatory Visit: Payer: Medicare Other | Attending: Cardiology | Admitting: Cardiology

## 2023-04-08 ENCOUNTER — Other Ambulatory Visit (HOSPITAL_COMMUNITY): Payer: Self-pay

## 2023-04-08 VITALS — BP 125/62 | HR 90 | Wt 198.0 lb

## 2023-04-08 DIAGNOSIS — I5032 Chronic diastolic (congestive) heart failure: Secondary | ICD-10-CM

## 2023-04-08 MED ORDER — SPIRONOLACTONE 25 MG PO TABS
12.5000 mg | ORAL_TABLET | Freq: Every day | ORAL | 5 refills | Status: DC
Start: 2023-04-08 — End: 2023-06-27

## 2023-04-08 MED ORDER — DAPAGLIFLOZIN PROPANEDIOL 10 MG PO TABS
10.0000 mg | ORAL_TABLET | Freq: Every day | ORAL | 6 refills | Status: DC
Start: 2023-04-08 — End: 2023-11-08

## 2023-04-08 NOTE — Patient Instructions (Signed)
Medication Changes:  Start taking Farxiga 10 mg (1 tablet) daily  Start taking Spironolactone 12.5 mg (0.5 tab) daily  Lab Work:  You will have lab work to complete in 10 days 4/29 at [facility] , please come before 5pm.  Testing/Procedures:  Your physician has requested that you have an echocardiogram. Echocardiography is a painless test that uses sound waves to create images of your heart. It provides your doctor with information about the size and shape of your heart and how well your heart's chambers and valves are working. This procedure takes approximately one hour. There are no restrictions for this procedure. Please do NOT wear cologne, perfume, aftershave, or lotions (deodorant is allowed). Please arrive 15 minutes prior to your appointment time.   Special Instructions // Education:  Do the following things EVERYDAY: Weigh yourself in the morning before breakfast. Write it down and keep it in a log. Take your medicines as prescribed Eat low salt foods--Limit salt (sodium) to 2000 mg per day.  Stay as active as you can everyday Limit all fluids for the day to less than 2 liters   Follow-Up in: you have a follow up with Dr. Shirlee Latch.    If you have any questions or concerns before your next appointment please send [place] a message through [place] or call our office at [phone] Monday-Friday 8 am-5 pm.   If you have an urgent need after hours on the weekend please call your Primary Cardiologist or the Advanced [facility] in [place] at [phone].

## 2023-04-08 NOTE — Progress Notes (Signed)
PCP: [name], MD Cardiology: Dr. Kirke Corin HF Cardiology: Dr. Shirlee Latch  72 y.o. with history of CAD, ischemic cardiomyopathy, and severe COPD on home oxygen was referred by Dr. Kirke Corin for evaluation of heart failure. Patient had an anterior MI in 2010.  At that time, he had DES to LAD and to RCA.  Repeat caths in 2017 and in 4/24 have shown patent stents and minimal progressive CAD.  Echoes showed EF 40-45% generally, then echo in 9/23 showed EF up to 50-55% though study was technically difficult.  LV-gram with cath in 4/24 suggested lower EF at 25-35%. Patient was a heavy smoker in the past, quit in 2020.  He has severe COPD and wears 3L home oxygen. 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    Due to progressive dyspnea and atypical chest pain, he had RHC/LHC done in 4/24.  Results of angiography and LV-gram as above, RHC showed normal filling pressures and preserved cardiac output.  He went to the ER yesterday with cough and worsening dyspnea, he was thought to have a COPD exacerbation and was given prednisone and nebs.    Today, he is feeling better than yesterday, thinks prednisone has helped.  He is not audibly wheezing and appears comfortable/non-tachypneic.  He was mildly short of breath walking into the office today, this is better than yesterday.  He has episodes of atypical chest pain that he thinks may be related to wheezing. He sees [facility].   ECG (04/07/23, personally reviewed): NSR, LAFB, old ASMI  Labs (2/24): LDL 47 Labs (4/24): hgb 13.3, K 4.1, creatinine 0.95  PMH: 1. CAD: Anterior MI in 2010 with DES to LAD and RCA.  - LHC (2017): Patent stents.  - LHC (4/24): Mild nonobstructive CAD with patent stents.  2. Chronic systolic CHF: Ischemic cardiomyopathy.   - Echo (2019): EF 40-45% - Echo (2/22): EF 40-45% - Echo (9/23): Technically difficult, EF 50-55%.  - LV-gram (4/24):  EF 25-35% - RHC (4/24): mean RA 6, PA 38/15, mean PCWP 15, CI 3.16 3. COPD: On 3.5 L home oxygen, severe. Quit smoking 2020.  4. H/o PVCs 5. Esophageal squamous cell carcinoma: S/p chemo-radiation.  6. Prostate cancer: Radiation.   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  Vaping Use   Vaping Use: Never used  Substance and Sexual Activity   Alcohol use: No    Alcohol/week: 0.0 standard drinks of alcohol   Drug use: No   Sexual activity: Yes  Other Topics Concern   Not on file  Social History Narrative   Marital status: Widowed. Lives alone.    Social Determinants of Health   Financial Resource Strain: Not on file  Food Insecurity: No Food Insecurity (11/14/2022)   Hunger  Vital Sign    Worried About Programme researcher, broadcasting/film/video in the Last Year: Never true    Ran Out of Food in the Last Year: Never true  Transportation Needs: No Transportation Needs (11/14/2022)   PRAPARE - Administrator, Civil Service (Medical): No    Lack of Transportation (Non-Medical): No  Physical Activity: Not on file  Stress: Not on file  Social Connections: Not on file  Intimate Partner Violence: Not At Risk (11/14/2022)   Humiliation, Afraid, Rape, and Kick questionnaire    Fear of Current or Ex-Partner: No    Emotionally Abused: No    Physically Abused: No    Sexually Abused: No   Family History  Problem Relation Age of Onset   Heart attack  Brother        Brother #1   Diabetes Brother    Hypertension Brother        #3   Coronary artery disease Father 25       deceased   Heart attack Father    Diabetes Father    Heart disease Father    COPD Mother 61       deceased   Alcohol abuse Sister        polysubstance abuse   COPD Sister    Lung cancer Sister    Alcohol abuse Sister        polysubstance abuse   Penile cancer Brother    Diabetes Brother    Prostate cancer Neg Hx    Bladder Cancer Neg Hx    Kidney cancer Neg Hx    ROS: All systems reviewed and negative except as per HPI.   Current Outpatient Medications  Medication Sig Dispense Refill   albuterol (VENTOLIN HFA) 108 (90 Base) MCG/ACT inhaler Inhale 2 puffs into the lungs every 6 (six) hours as needed for wheezing or shortness of breath. 8 g 2   ALPRAZolam (XANAX) 0.5 MG tablet Take 0.5 mg by mouth 3 (three) times daily.     aspirin 81 MG tablet Take 81 mg by mouth daily.     atorvastatin (LIPITOR) 80 MG tablet Take 1 tablet (80 mg total) by mouth daily. 90 tablet 3   budesonide (PULMICORT) 0.5 MG/2ML nebulizer solution Take 2 mLs (0.5 mg total) by nebulization in the morning and at bedtime. 320 mL 12   dapagliflozin propanediol (FARXIGA) 10 MG TABS tablet Take 1 tablet (10 mg total) by mouth daily before breakfast. 30 tablet 6   fluticasone (FLONASE) 50 MCG/ACT nasal spray Place 2 sprays into both nostrils daily.     furosemide (LASIX) 20 MG tablet Take 20 mg by mouth daily.     ipratropium-albuterol (DUONEB) 0.5-2.5 (3) MG/3ML SOLN Take 3 mLs by nebulization 2 (two) times daily.     ipratropium-albuterol (DUONEB) 0.5-2.5 (3) MG/3ML SOLN Take 3 mLs by nebulization every 6 (six) hours as needed. 360 mL 11   isosorbide mononitrate (IMDUR) 30 MG 24 hr tablet Take 1 tablet (30 mg total) by mouth daily. 90 tablet 3   ketoconazole (NIZORAL) 2 % cream Apply topically 2 (two) times daily.     lansoprazole (PREVACID) 30 MG capsule Take 1 capsule (30 mg total) by mouth  2 (two) times daily before a meal. 180 capsule 1   loratadine (CLARITIN) 10 MG tablet Take 10 mg by mouth daily.     losartan (COZAAR) 25 MG tablet Take 1 tablet (25 mg total) by mouth daily. 90 tablet 3   mirtazapine (REMERON)  45 MG tablet Take 45 mg by mouth at bedtime.     Multiple Vitamin (MULTIVITAMIN) capsule Take 1 capsule by mouth daily.     nitroGLYCERIN (NITROSTAT) 0.4 MG SL tablet Place 1 tablet (0.4 mg total) under the tongue every 5 (five) minutes as needed. 25 tablet 3   OXYGEN Inhale 3 L into the lungs daily.     predniSONE (DELTASONE) 50 MG tablet Take 1 tablet daily for the next 4 days starting on 04/08/2023 4 tablet 0   Spacer/Aero-Holding Chambers (AEROCHAMBER MV) inhaler Use as instructed 1 each 2   spironolactone (ALDACTONE) 25 MG tablet Take 0.5 tablets (12.5 mg total) by mouth daily. 30 tablet 5   No current facility-administered medications for this visit.   BP 125/62   Pulse 90   Wt 198 lb (89.8 kg)   SpO2 98% Comment: on 3L  BMI 28.41 kg/m  General: NAD Neck: No JVD, no thyromegaly or thyroid nodule.  Lungs: Distant breath sounds.  CV: Nondisplaced PMI.  Heart regular S1/S2, no S3/S4, no murmur.  No peripheral edema.  No carotid bruit.  Normal pedal pulses.  Abdomen: Soft, nontender, no hepatosplenomegaly, no distention.  Skin: Intact without lesions or rashes.  Neurologic: Alert and oriented x 3.  Psych: Normal affect. Extremities: No clubbing or cyanosis.  HEENT: Normal.   Assessment/Plan: 1. Chronic systolic CHF: Ischemic cardiomyopathy.  Historically, EF has been in the 40-45% range, but echo in 9/23 showed EF 50-55% (technically difficult study).  LV-gram with cath in 4/24 suggested that EF may be down to the 25-35% range.  He had no new coronary disease on cath in 4/24 to explain fall in EF.  NYHA class III symptoms, but this is confounded by severe COPD.  Recent symptoms, I think, are due to COPD (he feels better with prednisone started yesterday).  He  is not volume overloaded on exam.  He has had trouble with GDMT in the past due to lightheadedness.  - Continue Lasix 20 mg daily.  Recent BMET was stable.  - Continue losartan 25 mg daily, may be able to transition to [facility] in the future if BP remains stable.  - Add Farxiga 10 mg daily and spironolactone 12.5 mg daily.  BMET in 10 days.  - With severe COPD/exacerbations, would not use Coreg.  Could consider more beta-1 selective bisoprolol at some point down the road.  - I will arrange for formal echo to reassess EF given significant drop documented on recent LV-gram.  2. COPD: Severe, on 3.5 L home oxygen. Prior smoker.  As above, I suspect his recent increased dyspnea is COPD-related.  He does not appear volume overloaded on exam.  - Continue followup with pulmonary.  3.  CAD: Anterior MI in 2010 with DES to LAD and RCA.  Cath in 4/24 showed patent stents and mild nonobstructive CAD.  He has episodes of atypical chest pain likely related to COPD/wheezing.  - Continue ASA 81 daily.  - Continue atorvastatin 80 mg daily.  - Continue Imdur for now, may be able to stop this in the future to allow more BP room for GDMT.   Followup in 3 weeks for med titration.   Marca Ancona 04/08/2023

## 2023-04-11 ENCOUNTER — Telehealth (HOSPITAL_COMMUNITY): Payer: Self-pay

## 2023-04-11 ENCOUNTER — Other Ambulatory Visit (HOSPITAL_COMMUNITY): Payer: Self-pay

## 2023-04-11 NOTE — Telephone Encounter (Signed)
Advanced Heart Failure Patient Advocate Encounter  The patient was approved for a [employer] grant that will help cover the cost of Farxiga, Losartan, Spironolactone.  Total amount awarded, $10,000.  Effective: 03/12/2023 - 03/10/2024.  BIN [id number] PCN [id number] Group [id number] ID [id number]  Approval and processing information given to [facility]. Called patient to confirm.  Burnell Blanks, CPhT Rx Patient Advocate Phone: [phone]

## 2023-04-13 ENCOUNTER — Encounter: Payer: Medicare Other | Admitting: *Deleted

## 2023-04-13 DIAGNOSIS — I5032 Chronic diastolic (congestive) heart failure: Secondary | ICD-10-CM | POA: Diagnosis not present

## 2023-04-13 DIAGNOSIS — Z5189 Encounter for other specified aftercare: Secondary | ICD-10-CM | POA: Diagnosis not present

## 2023-04-13 DIAGNOSIS — J449 Chronic obstructive pulmonary disease, unspecified: Secondary | ICD-10-CM

## 2023-04-13 NOTE — Progress Notes (Signed)
Daily Session Note  Patient Details  Name: Leslie Sims MRN: [id number] Date of Birth: Feb 18, 1951 Referring Provider:   Flowsheet Row Pulmonary Rehab from 03/28/2023 in [facility] Cardiac and Pulmonary Rehab  Referring Provider Leslie Sims       Encounter Date: 04/13/2023  Check In:  Session Check In - 04/13/23 1348       Check-In   Supervising physician immediately available to respond to emergencies See telemetry face sheet for immediately available ER MD    Location [facility]-Cardiac & Pulmonary Rehab    Staff Present Leslie Givens, RN Leslie Sims, BS, ACSM CEP, Exercise Physiologist;Leslie Sims, [place]    Virtual Visit No    Medication changes reported     Yes    Comments started farxiga and spirinolactone    Fall or balance concerns reported    No    Warm-up and Cool-down Performed on first and last piece of equipment    Resistance Training Performed Yes    VAD Patient? No    PAD/SET Patient? No      Pain Assessment   Currently in Pain? No/denies                Social History   Tobacco Use  Smoking Status Former   Packs/day: 3.00   Years: 42.00   Additional pack years: 0.00   Total pack years: 126.00   Types: Cigarettes   Quit date: 02/10/2019   Years since quitting: 4.1  Smokeless Tobacco Never  Tobacco Comments   25 months ago most smoked 4 packs a day .    Goals Met:  Independence with exercise equipment Exercise tolerated well No report of concerns or symptoms today Strength training completed today  Goals Unmet:  Not Applicable  Comments: First full day of exercise!  Patient was oriented to gym and equipment including functions, settings, policies, and procedures.  Patient's individual exercise prescription and treatment plan were reviewed.  All starting workloads were established based on the results of the 6 minute walk test done at initial orientation visit.  The plan for exercise progression was also introduced and progression will be  customized based on patient's performance and goals.    Dr. Bethann Punches is Medical Director for [facility] Cardiac Rehabilitation.  Dr. Vida Rigger is Medical Director for [facility] Pulmonary Rehabilitation.

## 2023-04-14 ENCOUNTER — Encounter: Payer: Medicare Other | Admitting: *Deleted

## 2023-04-14 DIAGNOSIS — J449 Chronic obstructive pulmonary disease, unspecified: Secondary | ICD-10-CM | POA: Diagnosis not present

## 2023-04-14 NOTE — Progress Notes (Signed)
Daily Session Note  Patient Details  Name: Leslie Sims MRN: [id number] Date of Birth: November 27, 1951 Referring Provider:   Flowsheet Row Pulmonary Rehab from 03/28/2023 in [facility] Cardiac and Pulmonary Rehab  Referring Provider [name]       Encounter Date: 04/14/2023  Check In:  Session Check In - 04/14/23 1355       Check-In   Supervising physician immediately available to respond to emergencies See telemetry face sheet for immediately available ER MD    Location [facility]-Cardiac & Pulmonary Rehab    Staff Present Lanny Hurst, RN, Silas Flood, BS, Exercise Physiologist;Other   [place] Bigelow, BS & Terrilyn Saver, [place]   Virtual Visit No    Medication changes reported     No    Fall or balance concerns reported    No    Warm-up and Cool-down Performed on first and last piece of equipment    Resistance Training Performed Yes    VAD Patient? No    PAD/SET Patient? No      Pain Assessment   Currently in Pain? No/denies                Social History   Tobacco Use  Smoking Status Former   Packs/day: 3.00   Years: 42.00   Additional pack years: 0.00   Total pack years: 126.00   Types: Cigarettes   Quit date: 02/10/2019   Years since quitting: 4.1  Smokeless Tobacco Never  Tobacco Comments   25 months ago most smoked 4 packs a day .    Goals Met:  Independence with exercise equipment Exercise tolerated well No report of concerns or symptoms today Strength training completed today  Goals Unmet:  Not Applicable  Comments: Pt able to follow exercise prescription today without complaint.  Will continue to monitor for progression.    Dr. Bethann Punches is Medical Director for [facility] Cardiac Rehabilitation.  Dr. Vida Rigger is Medical Director for [facility] Pulmonary Rehabilitation.

## 2023-04-15 ENCOUNTER — Telehealth: Payer: Self-pay | Admitting: Pulmonary Disease

## 2023-04-15 ENCOUNTER — Emergency Department
Admission: EM | Admit: 2023-04-15 | Discharge: 2023-04-15 | Disposition: A | Discharge status: Home / Self Care | Payer: Medicare Other | Attending: Emergency Medicine | Admitting: Emergency Medicine

## 2023-04-15 ENCOUNTER — Emergency Department: Payer: Medicare Other

## 2023-04-15 ENCOUNTER — Telehealth (HOSPITAL_COMMUNITY): Payer: Self-pay

## 2023-04-15 DIAGNOSIS — Z8546 Personal history of malignant neoplasm of prostate: Secondary | ICD-10-CM | POA: Insufficient documentation

## 2023-04-15 DIAGNOSIS — E119 Type 2 diabetes mellitus without complications: Secondary | ICD-10-CM | POA: Insufficient documentation

## 2023-04-15 DIAGNOSIS — I2510 Atherosclerotic heart disease of native coronary artery without angina pectoris: Secondary | ICD-10-CM | POA: Insufficient documentation

## 2023-04-15 DIAGNOSIS — R079 Chest pain, unspecified: Secondary | ICD-10-CM | POA: Diagnosis present

## 2023-04-15 DIAGNOSIS — R062 Wheezing: Secondary | ICD-10-CM | POA: Diagnosis not present

## 2023-04-15 DIAGNOSIS — J449 Chronic obstructive pulmonary disease, unspecified: Secondary | ICD-10-CM | POA: Insufficient documentation

## 2023-04-15 DIAGNOSIS — I5020 Unspecified systolic (congestive) heart failure: Secondary | ICD-10-CM | POA: Insufficient documentation

## 2023-04-15 DIAGNOSIS — I110 Hypertensive heart disease with heart failure: Secondary | ICD-10-CM | POA: Diagnosis not present

## 2023-04-15 LAB — CBC
HCT: 41.2 % (ref 39.0–52.0)
Hemoglobin: 13.2 g/dL (ref 13.0–17.0)
MCH: 29.9 pg (ref 26.0–34.0)
MCHC: 32 g/dL (ref 30.0–36.0)
MCV: 93.4 fL (ref 80.0–100.0)
Platelets: 258 10*3/uL (ref 150–400)
RBC: 4.41 MIL/uL (ref 4.22–5.81)
RDW: 12.3 % (ref 11.5–15.5)
WBC: 6.8 10*3/uL (ref 4.0–10.5)
nRBC: 0 % (ref 0.0–0.2)

## 2023-04-15 LAB — BASIC METABOLIC PANEL
Anion gap: 11 (ref 5–15)
BUN: 18 mg/dL (ref 8–23)
CO2: 26 mmol/L (ref 22–32)
Calcium: 9 mg/dL (ref 8.9–10.3)
Chloride: 99 mmol/L (ref 98–111)
Creatinine, Ser: 0.98 mg/dL (ref 0.61–1.24)
GFR, Estimated: >60 mL/min (ref >60)
Glucose, Bld: 150 mg/dL — ABNORMAL HIGH (ref 70–99)
Potassium: 3.8 mmol/L (ref 3.5–5.1)
Sodium: 136 mmol/L (ref 135–145)

## 2023-04-15 LAB — BRAIN NATRIURETIC PEPTIDE: B Natriuretic Peptide: 24.3 pg/mL (ref 0.0–100.0)

## 2023-04-15 LAB — TROPONIN I (HIGH SENSITIVITY)
Troponin I (High Sensitivity): 9 ng/L (ref <18)
Troponin I (High Sensitivity): 11 ng/L (ref <18)

## 2023-04-15 MED ORDER — IPRATROPIUM-ALBUTEROL 0.5-2.5 (3) MG/3ML IN SOLN
3.0000 mL | Freq: Once | RESPIRATORY_TRACT | Status: AC
  Administered 2023-04-15: 3 mL via RESPIRATORY_TRACT
  Filled 2023-04-15: qty 3

## 2023-04-15 NOTE — Telephone Encounter (Signed)
He is not having cough or fever.  I am concerned about his left side discomfort and increased shortness of breath after addition of new medications.  It is very hard for me to say over a phone call what is causing his symptoms.  I would recommend evaluation at the ED these issues can be sorted out.

## 2023-04-15 NOTE — Discharge Instructions (Signed)
Your cardiac workup today was reassuring.  Please continue to use your inhalers for your COPD.  If your breathing is worsening or your chest pain is worsening or changing then please return to the emergency department.

## 2023-04-15 NOTE — Telephone Encounter (Signed)
Patient called indicating some chest discomfort and burning. He denies pain or pressure. He is having some shortness of breath, but it is relieved by his nebulizer. He denies any swelling or weight gain, or any signs of fluid issues. After speaking with Herbert Seta, we advised him to call his pulmonary doctor to evaluate if COPD exacerbation.

## 2023-04-15 NOTE — ED Triage Notes (Signed)
Pt arrives via EMS from home with SOB and CP. Pt sees the heart clinic and a pulmonologist as well. The heart clinic advised pt to come and be seen at the ED. Pt was seen at the heart clinic and here previously for the same thing. Pt also sts that he is having increased SOB.

## 2023-04-15 NOTE — Telephone Encounter (Signed)
Patient is aware of recommendations and voiced his understanding.  Nothing further needed. 

## 2023-04-15 NOTE — ED Provider Notes (Signed)
[facility] Provider Note    Event Date/Time   First MD Initiated Contact with Patient 04/15/23 1811     (approximate)   History   Chest Pain and Shortness of Breath   HPI  Leslie Sims is a 72 y.o. male with past medical history of COPD, coronary artery disease, CHF, diabetes who presents with chest pain and dyspnea.  Patient tells me that he he has been having left-sided chest pain really since he had a cardiac cath last week.  The pain is left-sided and feels like a burning pressure type pain it is constant nonexertional nonpleuritic and does not radiate.  He does note increasing dyspnea.  He was treated for COPD last week and prednisone seem to help his symptoms.  He has not had any productive cough denies hemoptysis denies lower extremity swelling or pain.  He is compliant with his medications.  Patient saw cardiology as an outpatient on 4/9.  That time was noted that for several months he was having intermittent left-sided chest discomfort.  Decision was made to proceed to heart cath which was performed on 4/15 which showed no new coronary disease but did show that his EF was reduced.  It was felt that his chest pain was likely not cardiac.     Past Medical History:  Diagnosis Date   Allergic rhinitis    Anxiety    Back pain    Cancer (HCC)    prostate   CHF (congestive heart failure) (HCC)    Colon polyps 08/2012   colonoscopy; multiple colon polyps; repeat colonoscopy in one year.   Complication of anesthesia    COPD (chronic obstructive pulmonary disease) (HCC)    a. on home O2.   Coronary artery disease 11/2009   a. late presenting ant MI; b. LHC 100% pLAD s/p PCI/DES, 99% mRCA s/p PCI/DES, EF 35%; c. nuclear stress test 05/13: prior ant/inf infarcts w/o ischemia, EF 43%; d. LHC 02/14: widely patent stents with no other obs dz, EF 40%; e. LHC 11/17: LM nl, mLAD 10%, patent LAD stent, dLAD 20%, p-mRCA 10%, mRCA 40%, patent RCA stent, EF  35-45%    Depression    Diabetes (HCC)    Dysphagia    Family history of adverse reaction to anesthesia    GERD (gastroesophageal reflux disease)    Headache(784.0)    Helicobacter pylori (H. pylori)    Hemorrhoids    Hernia    inguinal   HFrEF (heart failure with reduced ejection fraction) (HCC)    a. 10/2016 LV gram: EF 35-45%; b. 06/2018 Echo: EF 40-45%. c. 01/2021 Echo: EF 40-45%   Hyperlipidemia    Hypertension    Insomnia    Ischemic cardiomyopathy    a. 10/2016 LV gram: EF 35-45%; b. 06/2018 Echo: EF 40-45%.   Myalgia    Oxygen dependent    Peptic ulcer    Pulmonary nodules    ST elevation (STEMI) myocardial infarction involving left anterior descending coronary artery (HCC) 11/2009   a. 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   Anemia due to antineoplastic chemotherapy 06/26/2022   Constipation 06/19/2022   Encounter for antineoplastic chemotherapy 05/31/2022   Weight  loss 05/31/2022   Dysphagia 05/31/2022   Primary esophageal squamous cell carcinoma (HCC) 05/04/2022   COPD exacerbation (HCC) 07/31/2021   Prostate cancer (HCC) 10/11/2020   Elevated PSA 07/28/2020   Iron deficiency anemia 05/20/2020   Goals of care, counseling/discussion    Palliative care by specialist    Healthcare-associated pneumonia 01/28/2019   COPD with acute exacerbation (HCC) 12/31/2018   Generalized anxiety disorder 11/12/2018   Moderate episode of recurrent major depressive disorder (HCC) 11/12/2018   Chronic respiratory failure with hypoxia (HCC) 09/29/2018   Acute respiratory failure with hypoxia and hypercapnia (HCC) 09/13/2018   Acute on chronic respiratory failure with hypoxia and hypercapnia (HCC) 08/24/2018   Atypical chest pain  07/02/2018   Grief reaction 01/13/2018   COPD with hypoxia (HCC) 12/25/2017   Unstable angina (HCC)    Benign neoplasm of ascending colon    Benign neoplasm of descending colon    Benign neoplasm of sigmoid colon    History of colonic polyps    Pulmonary nodules/lesions, multiple 01/05/2015   Bruit of left carotid artery 11/04/2014   Sleep disorder 07/18/2013   Seasonal and perennial allergic rhinitis 05/29/2013   Bradycardia 04/20/2011   SMOKER 07/30/2010   Coronary artery disease 04/23/2010   Hyperlipidemia 03/24/2010   DEPRESSION/ANXIETY 03/24/2010   Chronic heart failure with preserved ejection fraction (HFpEF) (HCC) 03/24/2010   COPD, very severe (HCC) 03/24/2010   GERD 03/24/2010   Essential hypertension 11/21/2009     Physical Exam  Triage Vital Signs: ED Triage Vitals  Enc Vitals Group     BP 04/15/23 1721 136/73     Pulse Rate 04/15/23 1721 90     Resp 04/15/23 1721 17     Temp 04/15/23 1721 98 F (36.7 C)     Temp Source 04/15/23 1721 Oral     SpO2 04/15/23 1721 94 %     Weight 04/15/23 1722 195 lb (88.5 kg)     Height --      Head Circumference --      Peak Flow --      Pain Score 04/15/23 1722 3     Pain Loc --      Pain Edu? --      Excl. in GC? --     Most recent vital signs: Vitals:   04/15/23 2100 04/15/23 2130  BP: 139/71 133/77  Pulse: 85 84  Resp:  16  Temp:  98.1 F (36.7 C)  SpO2: 97% 94%     General: Awake, no distress.  CV:  Good peripheral perfusion.  No peripheral edema Resp:  Normal effort.  Expiratory wheezing but good air movement Abd:  No distention.  Neuro:             Awake, Alert, Oriented x 3  Other:     ED Results / Procedures / Treatments  Labs (all labs ordered are listed, but only abnormal results are displayed) Labs Reviewed  BASIC METABOLIC PANEL - Abnormal; Notable for the following components:      Result Value   Glucose, Bld 150 (*)    All other components within normal limits  CBC  BRAIN NATRIURETIC  PEPTIDE  TROPONIN I (HIGH SENSITIVITY)  TROPONIN I (HIGH SENSITIVITY)     EKG  EKG reviewed interpreted myself shows sinus rhythm with left anterior fascicular block nonspecific intraventricular conduction delay ST depression V5 V6   RADIOLOGY I reviewed and interpreted the CXR which does not show any acute cardiopulmonary process    PROCEDURES:  Critical Care performed: No  Procedures  The patient is on the cardiac monitor to evaluate for evidence of arrhythmia and/or significant heart rate changes.   MEDICATIONS ORDERED IN ED: Medications  ipratropium-albuterol (DUONEB) 0.5-2.5 (3) MG/3ML nebulizer solution 3 mL (3 mLs Nebulization Given 04/15/23 1919)  ipratropium-albuterol (DUONEB) 0.5-2.5 (3) MG/3ML nebulizer solution 3 mL (3 mLs Nebulization Given 04/15/23 1918)  ipratropium-albuterol (DUONEB) 0.5-2.5 (3) MG/3ML nebulizer solution 3 mL (3 mLs Nebulization Given 04/15/23 1918)     IMPRESSION / MDM / ASSESSMENT AND PLAN / ED COURSE  I reviewed the triage vital signs and the nursing notes.                              Patient's presentation is most consistent with acute presentation with potential threat to life or bodily function.  Differential diagnosis includes, but is not limited to, ACS, pneumothorax, pericarditis/myocarditis, pulmonary embolism, musculoskeletal pain  The patient is a 72 year old male with past medical history of COPD and heart failure coronary artery disease who presents because of chest pain and shortness of breath.  Patient has had chest pain for several months.  He tells me that it started when he had his cath last week but I see from recent cardiology visit on 4/9 that patient was describing similar pain at that time and had been going on for several months.  It is a constant pain over the last week burning/pressure-like nonradiating nonexertional.  Patient does feel somewhat more short of breath over the last several days as well with no coughing  no fevers.  Denies lower extremity swelling.  She had recent cath which did not show any new lesion and this is in the setting of his similar chest pain at the time which is overall reassuring that the chest pain is unlikely to be due to new cardiac lesion that would benefit from intervention.  Patient's pulse ox is within normal limits on his baseline O2.  Does have some end expiratory wheezing but is moving good air is not in any respiratory distress.  No signs of DVT no edema in the lower extremities.  Chest x-ray does not show any pulmonary edema.  Patient's EKG does show some lateral ST depression but similar to prior.  Troponins x 2 are negative.  BNP is negative.  Patient was given 3 DuoNebs and he did feel improved from a respiratory standpoint.  Given reassuring workup today I do think patient is stable for discharge and he would like to be discharged home.  We discussed return precautions.  Recommended cardiology and pulmonology follow-up.      FINAL CLINICAL IMPRESSION(S) / ED DIAGNOSES   Final diagnoses:  Chest pain, unspecified type     Rx / DC Orders   ED Discharge Orders     None        Note:  This document was prepared using Dragon voice recognition software and may include unintentional dictation errors.   Georga Hacking, MD 04/15/23 [phone]

## 2023-04-15 NOTE — Telephone Encounter (Signed)
Called and spoke pt patient.  He reports of increased SOB, mild left side chest discomfort and mild wheezing.  Denied cough, f/c/s or additional sx. He spoke with heart failure clinic and was advised to contact our office.  He started [place] 10mg  and Spironolactone 25mg  on 04/12/2023. Sx developed yesterday.  He is concerned that sx are related to new meds, as these are side affects. He wears 3L cont spo2 maintains between 88-94%. He is doing Duoneb 4-5x daily and Ventolin PRN. He has not used Ventolin since sx developed.   Dr. [name], please advise. Thanks.

## 2023-04-18 ENCOUNTER — Encounter: Payer: Medicare Other | Admitting: *Deleted

## 2023-04-18 ENCOUNTER — Other Ambulatory Visit
Admission: RE | Admit: 2023-04-18 | Discharge: 2023-04-18 | Disposition: A | Discharge status: Home / Self Care | Payer: Medicare Other | Source: Home / Self Care | Attending: Cardiology | Admitting: Cardiology

## 2023-04-18 DIAGNOSIS — I5032 Chronic diastolic (congestive) heart failure: Secondary | ICD-10-CM

## 2023-04-18 DIAGNOSIS — J449 Chronic obstructive pulmonary disease, unspecified: Secondary | ICD-10-CM

## 2023-04-18 LAB — BASIC METABOLIC PANEL
Anion gap: 10 (ref 5–15)
BUN: 13 mg/dL (ref 8–23)
CO2: 25 mmol/L (ref 22–32)
Calcium: 8.9 mg/dL (ref 8.9–10.3)
Chloride: 105 mmol/L (ref 98–111)
Creatinine, Ser: 0.89 mg/dL (ref 0.61–1.24)
GFR, Estimated: >60 mL/min (ref >60)
Glucose, Bld: 95 mg/dL (ref 70–99)
Potassium: 3.9 mmol/L (ref 3.5–5.1)
Sodium: 140 mmol/L (ref 135–145)

## 2023-04-18 LAB — BRAIN NATRIURETIC PEPTIDE: B Natriuretic Peptide: 32.3 pg/mL (ref 0.0–100.0)

## 2023-04-18 NOTE — Progress Notes (Signed)
Daily Session Note  Patient Details  Name: Regie Bunner MRN: [id number] Date of Birth: 11-Feb-1951 Referring Provider:   Flowsheet Row Pulmonary Rehab from 03/28/2023 in [facility] Cardiac and Pulmonary Rehab  Referring Provider [name]       Encounter Date: 04/18/2023  Check In:  Session Check In - 04/18/23 1341       Check-In   Supervising physician immediately available to respond to emergencies See telemetry face sheet for immediately available ER MD    Location [facility]-Cardiac & Pulmonary Rehab    Staff Present Susann Givens, RN BSN;Joseph Reino Kent, Guinevere Ferrari, RN, [place]    Virtual Visit No    Medication changes reported     No    Fall or balance concerns reported    No    Warm-up and Cool-down Performed on first and last piece of equipment    Resistance Training Performed Yes    VAD Patient? No    PAD/SET Patient? No      Pain Assessment   Currently in Pain? No/denies                Social History   Tobacco Use  Smoking Status Former   Packs/day: 3.00   Years: 42.00   Additional pack years: 0.00   Total pack years: 126.00   Types: Cigarettes   Quit date: 02/10/2019   Years since quitting: 4.1  Smokeless Tobacco Never  Tobacco Comments   25 months ago most smoked 4 packs a day .    Goals Met:  Independence with exercise equipment Exercise tolerated well No report of concerns or symptoms today Strength training completed today  Goals Unmet:  Not Applicable  Comments: Pt able to follow exercise prescription today without complaint.  Will continue to monitor for progression.    Dr. Bethann Punches is Medical Director for [facility] Cardiac Rehabilitation.  Dr. Vida Rigger is Medical Director for [facility] Pulmonary Rehabilitation.

## 2023-04-20 ENCOUNTER — Encounter: Payer: Medicare Other | Attending: Pulmonary Disease | Admitting: *Deleted

## 2023-04-20 DIAGNOSIS — J449 Chronic obstructive pulmonary disease, unspecified: Secondary | ICD-10-CM | POA: Insufficient documentation

## 2023-04-20 NOTE — Progress Notes (Signed)
Daily Session Note  Patient Details  Name: Leslie Sims MRN: [id number] Date of Birth: 08/18/1951 Referring Provider:   Flowsheet Row Pulmonary Rehab from 03/28/2023 in [facility] Cardiac and Pulmonary Rehab  Referring Provider [name]       Encounter Date: 04/20/2023  Check In:  Session Check In - 04/20/23 1348       Check-In   Supervising physician immediately available to respond to emergencies See telemetry face sheet for immediately available ER MD    Location [facility]-Cardiac & Pulmonary Rehab    Staff Present Susann Givens, RN Mabeline Caras, BS, ACSM CEP, Exercise Physiologist;Laureen Manson Passey, BS, RRT, CPFT;Melissa Caiola MS, RDN, LDN    Virtual Visit No    Medication changes reported     No    Fall or balance concerns reported    No    Warm-up and Cool-down Performed on first and last piece of equipment    Resistance Training Performed Yes    VAD Patient? No    PAD/SET Patient? No      Pain Assessment   Currently in Pain? No/denies                Social History   Tobacco Use  Smoking Status Former   Packs/day: 3.00   Years: 42.00   Additional pack years: 0.00   Total pack years: 126.00   Types: Cigarettes   Quit date: 02/10/2019   Years since quitting: 4.1  Smokeless Tobacco Never  Tobacco Comments   25 months ago most smoked 4 packs a day .    Goals Met:  Independence with exercise equipment Exercise tolerated well No report of concerns or symptoms today Strength training completed today  Goals Unmet:  Not Applicable  Comments: Pt able to follow exercise prescription today without complaint.  Will continue to monitor for progression.    Dr. Bethann Punches is Medical Director for [facility] Cardiac Rehabilitation.  Dr. Vida Rigger is Medical Director for [facility] Pulmonary Rehabilitation.

## 2023-04-21 ENCOUNTER — Telehealth: Payer: Self-pay

## 2023-04-21 ENCOUNTER — Ambulatory Visit: Payer: Medicare Other | Admitting: Cardiovascular Disease

## 2023-04-21 NOTE — Telephone Encounter (Signed)
Pt called to report that he has continued to experience increased symptoms since his last appt where farxiga 10 mg and spironolactone 12.5 mg were started. He states last week he went to the ED on advice of [facility] and his pulmonologist. He states ED didn't find any problems and released him. He states this past week he has continued to experience worse than baseline SOB. Pt denies weight gain or swelling. He states has increased his breathing treatments but states minimal improvement. He believes this is related to the spironolactone and asks if he can stop taking spironolactone. [facility] notified. Per Clarisa Kindred, NFP: Tell patient he can stop the spironolactone to see if his symptoms improve and see if he wants to see Dr Shirlee Latch next week.  Pt aware, and verbalized understanding. He did not want to schedule an appt for next week yet. He states he will stop the medication and see if he notices improvement first. He states he will call office Monday, 5/6, to update on symptoms and schedule sooner appt.

## 2023-04-24 ENCOUNTER — Other Ambulatory Visit: Payer: Self-pay

## 2023-04-24 ENCOUNTER — Emergency Department
Admission: EM | Admit: 2023-04-24 | Discharge: 2023-04-24 | Disposition: A | Discharge status: Home / Self Care | Payer: Medicare Other | Attending: Emergency Medicine | Admitting: Emergency Medicine

## 2023-04-24 ENCOUNTER — Emergency Department: Payer: Medicare Other

## 2023-04-24 DIAGNOSIS — J441 Chronic obstructive pulmonary disease with (acute) exacerbation: Secondary | ICD-10-CM | POA: Diagnosis not present

## 2023-04-24 DIAGNOSIS — R0602 Shortness of breath: Secondary | ICD-10-CM | POA: Diagnosis present

## 2023-04-24 DIAGNOSIS — I2510 Atherosclerotic heart disease of native coronary artery without angina pectoris: Secondary | ICD-10-CM | POA: Diagnosis not present

## 2023-04-24 DIAGNOSIS — I509 Heart failure, unspecified: Secondary | ICD-10-CM | POA: Insufficient documentation

## 2023-04-24 DIAGNOSIS — E119 Type 2 diabetes mellitus without complications: Secondary | ICD-10-CM | POA: Diagnosis not present

## 2023-04-24 LAB — CBC
HCT: 42 % (ref 39.0–52.0)
Hemoglobin: 13.3 g/dL (ref 13.0–17.0)
MCH: 30 pg (ref 26.0–34.0)
MCHC: 31.7 g/dL (ref 30.0–36.0)
MCV: 94.6 fL (ref 80.0–100.0)
Platelets: 211 10*3/uL (ref 150–400)
RBC: 4.44 MIL/uL (ref 4.22–5.81)
RDW: 12.5 % (ref 11.5–15.5)
WBC: 5.8 10*3/uL (ref 4.0–10.5)
nRBC: 0 % (ref 0.0–0.2)

## 2023-04-24 LAB — BASIC METABOLIC PANEL
Anion gap: 7 (ref 5–15)
BUN: 15 mg/dL (ref 8–23)
CO2: 29 mmol/L (ref 22–32)
Calcium: 8.9 mg/dL (ref 8.9–10.3)
Chloride: 104 mmol/L (ref 98–111)
Creatinine, Ser: 0.96 mg/dL (ref 0.61–1.24)
GFR, Estimated: >60 mL/min (ref >60)
Glucose, Bld: 164 mg/dL — ABNORMAL HIGH (ref 70–99)
Potassium: 3.6 mmol/L (ref 3.5–5.1)
Sodium: 140 mmol/L (ref 135–145)

## 2023-04-24 LAB — TROPONIN I (HIGH SENSITIVITY)
Troponin I (High Sensitivity): 10 ng/L (ref <18)
Troponin I (High Sensitivity): 10 ng/L (ref <18)

## 2023-04-24 LAB — BRAIN NATRIURETIC PEPTIDE: B Natriuretic Peptide: 38.3 pg/mL (ref 0.0–100.0)

## 2023-04-24 MED ORDER — IPRATROPIUM-ALBUTEROL 0.5-2.5 (3) MG/3ML IN SOLN
3.0000 mL | Freq: Once | RESPIRATORY_TRACT | Status: AC
  Administered 2023-04-24: 3 mL via RESPIRATORY_TRACT
  Filled 2023-04-24: qty 3

## 2023-04-24 MED ORDER — PREDNISONE 20 MG PO TABS: 40.0000 mg | ORAL_TABLET | Freq: Every day | ORAL | 0 refills | Status: AC

## 2023-04-24 MED ORDER — METHYLPREDNISOLONE SODIUM SUCC 125 MG IJ SOLR
125.0000 mg | Freq: Once | INTRAMUSCULAR | Status: AC
  Administered 2023-04-24: 125 mg via INTRAVENOUS
  Filled 2023-04-24: qty 2

## 2023-04-24 MED ORDER — FUROSEMIDE 10 MG/ML IJ SOLN
20.0000 mg | Freq: Once | INTRAMUSCULAR | Status: AC
  Administered 2023-04-24: 20 mg via INTRAVENOUS
  Filled 2023-04-24: qty 4

## 2023-04-24 NOTE — ED Triage Notes (Signed)
Pt to ED for SOB since 3 days, wears 3L at baseline, on 3L currently. Hx CHF and COPD. States has gained 3# in 3 days.  Skin dry. Respirations slightly labored, prolonged expiration.

## 2023-04-24 NOTE — ED Notes (Signed)
Pt ambulated around nurses station. Pts sp02 dropped to 91%.......Marland Kitchenbut he maintained mostly 98% on 4 lpm

## 2023-04-24 NOTE — ED Provider Notes (Signed)
[facility] Provider Note    Event Date/Time   First MD Initiated Contact with Patient 04/24/23 1222     (approximate)   History   Shortness of Breath   HPI  Leslie Sims is a 72 y.o. male with COPD, coronary disease, CHF, diabetes who comes in with concerns for shortness of breath.  To note patient did have a heart cath done on 4/15 that did not show any coronary disease but did show reduced EF.  Patient was also seen on 04/07/2023 and had treatment with steroids for COPD exacerbation.  Appears the patient is on Lasix 20.  Patient reports that he had a 3 pound weight gain in 2 days and was told that if he gains more than 2 pounds in a day he should come into the emergency room.  He reports been compliant with his Lasix.  He reports no significantly worsening swelling.  Does report feeling like his COPD is flared up with similar symptoms as previous.  He denies any history of blood clots any swelling in his legs or calf tenderness   Physical Exam   Triage Vital Signs: ED Triage Vitals  Enc Vitals Group     BP 04/24/23 1122 137/75     Pulse Rate 04/24/23 1122 (!) 102     Resp 04/24/23 1122 (!) 22     Temp 04/24/23 1128 98.3 F (36.8 C)     Temp Source 04/24/23 1122 Oral     SpO2 04/24/23 1122 98 %     Weight 04/24/23 1124 193 lb (87.5 kg)     Height 04/24/23 1124 5\' 10"  (1.778 m)     Head Circumference --      Peak Flow --      Pain Score 04/24/23 1124 0     Pain Loc --      Pain Edu? --      Excl. in GC? --     Most recent vital signs: Vitals:   04/24/23 1122 04/24/23 1128  BP: 137/75   Pulse: (!) 102   Resp: (!) 22   Temp:  98.3 F (36.8 C)  SpO2: 98%      General: Awake, no distress.  CV:  Good peripheral perfusion.  Resp:  Normal effort.  Wheezing noted bilaterally Abd:  No distention.  Soft nontender Other:  Trace edema noted bilaterally.   ED Results / Procedures / Treatments   Labs (all labs ordered are listed, but  only abnormal results are displayed) Labs Reviewed  BASIC METABOLIC PANEL - Abnormal; Notable for the following components:      Result Value   Glucose, Bld 164 (*)    All other components within normal limits  CBC  BRAIN NATRIURETIC PEPTIDE  TROPONIN I (HIGH SENSITIVITY)     EKG  My interpretation of EKG:  Normal sinus rhythm 97 without any ST elevation, T wave version aVL, incomplete right bundle branch block. RADIOLOGY I have reviewed the xray personally and interpreted no evidence of any pneumonia  PROCEDURES:  Critical Care performed: No  Procedures   MEDICATIONS ORDERED IN ED: Medications - No data to display   IMPRESSION / MDM / ASSESSMENT AND PLAN / ED COURSE  I reviewed the triage vital signs and the nursing notes.  Patient comes in with concerns for some shortness of breath and some weight gain.  Does not look significantly fluid overloaded on exam he does have some trace edema bilaterally.  Last EF on his  cath on 4/15 was 25 to 35%.  Will give a dose of IV Lasix, breathing treatments, steroids for COPD.  Considered PE but seems less likely given no prior history of this.  No evidence of DVT on examination.  Discussed with patient and he states this feels exactly like his typical COPD exacerbations and does not want to proceed with any CT imaging.  BNP is normal.  BMP is normal CBC reassuring.  Troponin is negative  Reevaluation patient reports feeling much better.  He is ambulatory with saturations above 92% we discussed admission versus discharge but he felt comfortable going home.  He is going to keep track of his weights and if he is continuing to gain weight tomorrow he can take additional dose of Lasix.  Will refer him to the heart failure clinic for close monitoring.  Will start him on some prednisone for his COPD exacerbation.  No indication for antibiotics at this time.  Patient's presentation is most consistent with exacerbation of chronic illness.         FINAL CLINICAL IMPRESSION(S) / ED DIAGNOSES   Final diagnoses:  COPD exacerbation (HCC)  Acute on chronic congestive heart failure, unspecified heart failure type (HCC)     Rx / DC Orders   ED Discharge Orders          Ordered    predniSONE (DELTASONE) 20 MG tablet  Daily with breakfast        04/24/23 1509    AMB referral to CHF clinic        04/24/23 1509             Note:  This document was prepared using Dragon voice recognition software and may include unintentional dictation errors.   Concha Se, MD 04/24/23 [phone]

## 2023-04-24 NOTE — Discharge Instructions (Addendum)
Take your breathing treatments. Take steroids to help with COPD.  Keep track of your weights- You can take an additional dose of lasix if needed to help increase urine outpt.  Follow up with cardiology for your heart.   Return to the ER if you develop worsening breathing, fevers or any other concerns

## 2023-04-25 ENCOUNTER — Encounter: Payer: Medicare Other | Admitting: *Deleted

## 2023-04-25 DIAGNOSIS — J449 Chronic obstructive pulmonary disease, unspecified: Secondary | ICD-10-CM

## 2023-04-25 NOTE — Progress Notes (Signed)
Daily Session Note  Patient Details  Name: Leslie Sims MRN: [id number] Date of Birth: December 14, 1951 Referring Provider:   Flowsheet Row Pulmonary Rehab from 03/28/2023 in [facility] Cardiac and Pulmonary Rehab  Referring Provider [name]       Encounter Date: 04/25/2023  Check In:  Session Check In - 04/25/23 1340       Check-In   Supervising physician immediately available to respond to emergencies See telemetry face sheet for immediately available ER MD    Location [facility]-Cardiac & Pulmonary Rehab    Staff Present Susann Givens, RN BSN;Joseph Reino Kent, RCP,RRT,BSRT;Megan Wilton, RN, [place]    Virtual Visit No    Medication changes reported     No    Comments increased prednisone 40mg     Fall or balance concerns reported    No    Warm-up and Cool-down Performed on first and last piece of equipment    Resistance Training Performed Yes    VAD Patient? No    PAD/SET Patient? No      Pain Assessment   Currently in Pain? No/denies                Social History   Tobacco Use  Smoking Status Former   Packs/day: 3.00   Years: 42.00   Additional pack years: 0.00   Total pack years: 126.00   Types: Cigarettes   Quit date: 02/10/2019   Years since quitting: 4.2  Smokeless Tobacco Never  Tobacco Comments   25 months ago most smoked 4 packs a day .    Goals Met:  Independence with exercise equipment Exercise tolerated well No report of concerns or symptoms today Strength training completed today  Goals Unmet:  Not Applicable  Comments: Pt able to follow exercise prescription today without complaint.  Will continue to monitor for progression.    Dr. Bethann Punches is Medical Director for [name] Cardiac Rehabilitation.  Dr. Vida Rigger is Medical Director for [facility] Pulmonary Rehabilitation.

## 2023-04-28 ENCOUNTER — Encounter: Payer: Medicare Other | Admitting: *Deleted

## 2023-04-28 DIAGNOSIS — J449 Chronic obstructive pulmonary disease, unspecified: Secondary | ICD-10-CM

## 2023-04-28 NOTE — Progress Notes (Signed)
Daily Session Note  Patient Details  Name: Leslie Sims MRN: [id number] Date of Birth: [birth year] Referring Provider:   Flowsheet Row Pulmonary Rehab from 03/28/2023 in [facility] Cardiac and Pulmonary Rehab  Referring Provider [name]       Encounter Date: 04/28/2023  Check In:  Session Check In - 04/28/23 1349       Check-In   Supervising physician immediately available to respond to emergencies See telemetry face sheet for immediately available ER MD    Location [facility]-Cardiac & Pulmonary Rehab    Staff Present Lanny Hurst, RN, ADN;Jessica Juanetta Gosling, MA, RCEP, CCRP, CCET;Joseph [place], [place]    Virtual Visit No    Medication changes reported     No    Fall or balance concerns reported    No    Warm-up and Cool-down Performed on first and last piece of equipment    Resistance Training Performed Yes    VAD Patient? No    PAD/SET Patient? No      Pain Assessment   Currently in Pain? No/denies                Social History   Tobacco Use  Smoking Status Former   Packs/day: 3.00   Years: 42.00   Additional pack years: 0.00   Total pack years: 126.00   Types: Cigarettes   Quit date: 02/10/2019   Years since quitting: 4.2  Smokeless Tobacco Never  Tobacco Comments   25 months ago most smoked 4 packs a day .    Goals Met:  Independence with exercise equipment Exercise tolerated well No report of concerns or symptoms today Strength training completed today  Goals Unmet:  Not Applicable  Comments: Pt able to follow exercise prescription today without complaint.  Will continue to monitor for progression.    Dr. Bethann Punches is Medical Director for [facility] Cardiac Rehabilitation.  Dr. Vida Rigger is Medical Director for [facility] Pulmonary Rehabilitation.

## 2023-04-29 ENCOUNTER — Encounter: Payer: Self-pay | Admitting: Pulmonary Disease

## 2023-05-02 ENCOUNTER — Encounter: Payer: Medicare Other | Admitting: *Deleted

## 2023-05-02 ENCOUNTER — Ambulatory Visit
Admission: RE | Admit: 2023-05-02 | Discharge: 2023-05-02 | Disposition: A | Discharge status: Home / Self Care | Payer: Medicare Other | Source: Ambulatory Visit | Attending: Cardiology | Admitting: Cardiology

## 2023-05-02 DIAGNOSIS — E785 Hyperlipidemia, unspecified: Secondary | ICD-10-CM | POA: Diagnosis not present

## 2023-05-02 DIAGNOSIS — I110 Hypertensive heart disease with heart failure: Secondary | ICD-10-CM | POA: Insufficient documentation

## 2023-05-02 DIAGNOSIS — I509 Heart failure, unspecified: Secondary | ICD-10-CM | POA: Insufficient documentation

## 2023-05-02 DIAGNOSIS — I2510 Atherosclerotic heart disease of native coronary artery without angina pectoris: Secondary | ICD-10-CM | POA: Diagnosis not present

## 2023-05-02 DIAGNOSIS — Z87891 Personal history of nicotine dependence: Secondary | ICD-10-CM | POA: Diagnosis not present

## 2023-05-02 DIAGNOSIS — J449 Chronic obstructive pulmonary disease, unspecified: Secondary | ICD-10-CM | POA: Diagnosis not present

## 2023-05-02 DIAGNOSIS — I5032 Chronic diastolic (congestive) heart failure: Secondary | ICD-10-CM | POA: Insufficient documentation

## 2023-05-02 LAB — ECHOCARDIOGRAM COMPLETE
AR max vel: 2.62 cm2
AV Area VTI: 2.77 cm2
AV Area mean vel: 2.49 cm2
AV Mean grad: 3 mmHg
AV Peak grad: 5.6 mmHg
Ao pk vel: 1.18 m/s
Area-P 1/2: 3.5 cm2
MV VTI: 2.61 cm2
S' Lateral: 4 cm

## 2023-05-02 MED ORDER — PERFLUTREN LIPID MICROSPHERE
1.0000 mL | INTRAVENOUS | Status: AC | PRN
  Administered 2023-05-02: 5 mL via INTRAVENOUS

## 2023-05-02 NOTE — Progress Notes (Signed)
*  PRELIMINARY RESULTS* Echocardiogram 2D Echocardiogram has been performed.  Leslie Sims 05/02/2023, 12:48 PM

## 2023-05-02 NOTE — Progress Notes (Signed)
Daily Session Note  Patient Details  Name: Leslie Sims MRN: [id number] Date of Birth: [birth year] Referring Provider:   Flowsheet Row Pulmonary Rehab from 03/28/2023 in [facility] Cardiac and Pulmonary Rehab  Referring Provider [name]       Encounter Date: 05/02/2023  Check In:  Session Check In - 05/02/23 1342       Check-In   Supervising physician immediately available to respond to emergencies See telemetry face sheet for immediately available ER MD    Location [facility]-Cardiac & Pulmonary Rehab    Staff Present Susann Givens, RN BSN;Joseph Reino Kent, RCP,RRT,BSRT;Megan Katrinka Blazing, RN, [place]    Virtual Visit No    Medication changes reported     No    Fall or balance concerns reported    No    Warm-up and Cool-down Performed on first and last piece of equipment    Resistance Training Performed Yes    VAD Patient? No    PAD/SET Patient? No      Pain Assessment   Currently in Pain? No/denies                Social History   Tobacco Use  Smoking Status Former   Packs/day: 3.00   Years: 42.00   Additional pack years: 0.00   Total pack years: 126.00   Types: Cigarettes   Quit date: 02/10/2019   Years since quitting: 4.2  Smokeless Tobacco Never  Tobacco Comments   25 months ago most smoked 4 packs a day .    Goals Met:  Independence with exercise equipment Exercise tolerated well No report of concerns or symptoms today Strength training completed today  Goals Unmet:  Not Applicable  Comments: Pt able to follow exercise prescription today without complaint.  Will continue to monitor for progression.'    Dr. Bethann Punches is Medical Director for [facility] Cardiac Rehabilitation.  Dr. Vida Rigger is Medical Director for [facility] Pulmonary Rehabilitation.

## 2023-05-03 ENCOUNTER — Telehealth (HOSPITAL_COMMUNITY): Payer: Self-pay

## 2023-05-03 NOTE — Telephone Encounter (Signed)
Patient aware of echo results.

## 2023-05-03 NOTE — Telephone Encounter (Signed)
-----   Message from Laurey Morale, MD sent at 05/02/2023  8:40 PM EDT ----- EF 35-40%, lower than prior echoes but higher than recorded on left ventriculogram at cath. Continue medical management of cardiomyopathy.

## 2023-05-04 ENCOUNTER — Encounter: Payer: Self-pay | Admitting: *Deleted

## 2023-05-04 ENCOUNTER — Encounter: Payer: Medicare Other | Admitting: *Deleted

## 2023-05-04 DIAGNOSIS — J449 Chronic obstructive pulmonary disease, unspecified: Secondary | ICD-10-CM

## 2023-05-04 NOTE — Progress Notes (Signed)
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)  Patient's Home Medications on Admission:  Current Outpatient Medications:    albuterol (VENTOLIN HFA) 108 (90 Base) MCG/ACT inhaler, Inhale 2 puffs into the lungs every 6 (six) hours as needed for wheezing or shortness of breath., Disp: 8 g, Rfl: 2   ALPRAZolam (XANAX) 0.5 MG tablet, Take 0.5 mg by mouth 3 (three) times daily., Disp: , Rfl:    aspirin 81 MG tablet, Take 81 mg by mouth daily., Disp: , Rfl:    atorvastatin (LIPITOR) 80 MG tablet, Take 1 tablet (80 mg total) by mouth daily., Disp: 90 tablet, Rfl: 3   budesonide (PULMICORT) 0.5 MG/2ML nebulizer solution, Take 2 mLs (0.5 mg total) by nebulization in the morning and at bedtime., Disp: 320 mL, Rfl: 12   dapagliflozin propanediol (FARXIGA) 10 MG TABS tablet, Take 1 tablet (10 mg total) by mouth daily before breakfast., Disp: 30 tablet, Rfl: 6   fluticasone (FLONASE) 50 MCG/ACT nasal spray, Place 2 sprays into both nostrils daily., Disp: , Rfl:    furosemide (LASIX) 20 MG tablet, Take 20 mg by mouth daily., Disp: , Rfl:    ipratropium-albuterol (DUONEB) 0.5-2.5 (3) MG/3ML SOLN, Take 3 mLs by nebulization 2 (two) times daily., Disp: , Rfl:    ipratropium-albuterol (DUONEB) 0.5-2.5 (3) MG/3ML SOLN, Take 3 mLs by nebulization every 6 (six) hours as needed., Disp: 360 mL, Rfl: 11   isosorbide mononitrate (IMDUR) 30 MG 24 hr tablet, Take 1 tablet (30 mg total) by mouth daily., Disp: 90 tablet, Rfl: 3   ketoconazole (NIZORAL) 2 % cream, Apply topically 2 (two) times daily., Disp: ,  Rfl:    lansoprazole (PREVACID) 30 MG capsule, Take 1 capsule (30 mg total) by mouth 2 (two) times daily before a meal., Disp: 180 capsule, Rfl: 1   loratadine (CLARITIN) 10 MG tablet, Take 10 mg by mouth daily., Disp: , Rfl:    losartan (COZAAR) 25 MG tablet, Take 1 tablet (25 mg total) by mouth daily., Disp: 90 tablet, Rfl: 3   mirtazapine (REMERON) 45 MG tablet, Take 45 mg by mouth at bedtime., Disp: , Rfl:    Multiple Vitamin (MULTIVITAMIN) capsule, Take 1 capsule by mouth daily., Disp: , Rfl:    nitroGLYCERIN (NITROSTAT) 0.4 MG SL tablet, Place 1 tablet (0.4 mg total) under the tongue every 5 (five) minutes as needed., Disp: 25 tablet, Rfl: 3   OXYGEN, Inhale 3 L into the lungs daily., Disp: , Rfl:    Spacer/Aero-Holding Chambers (AEROCHAMBER MV) inhaler, Use as instructed, Disp: 1 each, Rfl: 2   spironolactone (ALDACTONE) 25 MG tablet, Take 0.5 tablets (12.5 mg total) by mouth daily., Disp: 30 tablet, Rfl: 5  Past Medical History: Past Medical History:  Diagnosis Date   Allergic rhinitis    Anxiety    Back pain    Cancer (HCC)    prostate   CHF (congestive heart failure) (HCC)    Colon polyps 08/2012   colonoscopy; multiple colon polyps; repeat colonoscopy in one year.   Complication of anesthesia    COPD (chronic obstructive  pulmonary disease) (HCC)    a. on home O2.   Coronary artery disease 11/2009   a. late presenting ant MI; b. LHC 100% pLAD s/p PCI/DES, 99% mRCA s/p PCI/DES, EF 35%; c. nuclear stress test 05/13: prior ant/inf infarcts w/o ischemia, EF 43%; d. LHC 02/14: widely patent stents with no other obs dz, EF 40%; e. LHC 11/17: LM nl, mLAD 10%, patent LAD stent, dLAD 20%, p-mRCA 10%, mRCA 40%, patent RCA stent, EF 35-45%    Depression    Diabetes (HCC)    Dysphagia    Family history of adverse reaction to anesthesia    GERD (gastroesophageal reflux disease)    Headache(784.0)    Helicobacter pylori (H. pylori)    Hemorrhoids    Hernia    inguinal   HFrEF (heart  failure with reduced ejection fraction) (HCC)    a. 10/2016 LV gram: EF 35-45%; b. 06/2018 Echo: EF 40-45%. c. 01/2021 Echo: EF 40-45%   Hyperlipidemia    Hypertension    Insomnia    Ischemic cardiomyopathy    a. 10/2016 LV gram: EF 35-45%; b. 06/2018 Echo: EF 40-45%.   Myalgia    Oxygen dependent    Peptic ulcer    Pulmonary nodules    ST elevation (STEMI) myocardial infarction involving left anterior descending coronary artery (HCC) 11/2009   a. s/p PCI to the LAD   Status post dilation of esophageal narrowing 2000   Thickening of esophagus    Tinea pedis    Wears dentures    partial upper    Tobacco Use: Social History   Tobacco Use  Smoking Status Former   Packs/day: 3.00   Years: 42.00   Additional pack years: 0.00   Total pack years: 126.00   Types: Cigarettes   Quit date: 02/10/2019   Years since quitting: 4.2  Smokeless Tobacco Never  Tobacco Comments   25 months ago most smoked 4 packs a day .    Labs: Review Flowsheet  More data exists      Latest Ref Rng & Units 01/30/2019 04/10/2020 03/18/2021 01/26/2023 04/04/2023  Labs for ITP Cardiac and Pulmonary Rehab  Cholestrol 0 - 200 mg/dL - - 161  096  -  LDL (calc) 0 - 99 mg/dL - - 81  47  -  HDL-C >04 mg/dL - - 69  87  -  Trlycerides <150 mg/dL - - 77  540  -  Hemoglobin A1c 4.8 - 5.6 % 6.2  - - - -  PH, Arterial 7.35 - 7.45 - - - - 7.317   PCO2 arterial 32 - 48 mmHg - - - - 66.4   Bicarbonate 20.0 - 28.0 mmol/L - 31.3  - - 34.0  29.2   TCO2 22 - 32 mmol/L - - - - 36  31   O2 Saturation % - 92.4  - - 96  70      Pulmonary Assessment Scores:  Pulmonary Assessment Scores     Row Name 03/28/23 1702         ADL UCSD   ADL Phase Entry     SOB Score total 77     Rest 1     Walk 3     Stairs 4     Bath 4     Dress 3     Shop 4       CAT Score   CAT Score 21       mMRC Score   mMRC Score 3  UCSD: Self-administered rating of dyspnea associated with activities of daily living  (ADLs) 6-point scale (0 = "not at all" to 5 = "maximal or unable to do because of breathlessness")  Scoring Scores range from 0 to 120.  Minimally important difference is 5 units  CAT: CAT can identify the health impairment of COPD patients and is better correlated with disease progression.  CAT has a scoring range of zero to 40. The CAT score is classified into four groups of low (less than 10), medium (10 - 20), high (21-30) and very high (31-40) based on the impact level of disease on health status. A CAT score over 10 suggests significant symptoms.  A worsening CAT score could be explained by an exacerbation, poor medication adherence, poor inhaler technique, or progression of COPD or comorbid conditions.  CAT MCID is 2 points  mMRC: mMRC (Modified Medical Research Council) Dyspnea Scale is used to assess the degree of baseline functional disability in patients of respiratory disease due to dyspnea. No minimal important difference is established. A decrease in score of 1 point or greater is considered a positive change.   Pulmonary Function Assessment:  Pulmonary Function Assessment - 03/28/23 1651       Pulmonary Function Tests   FVC% 40 %    FEV1% 20 %   post bronchodilator   FEV1/FVC Ratio 37             Exercise Target Goals: Exercise Program Goal: Individual exercise prescription set using results from initial 6 min walk test and THRR while considering  patient's activity barriers and safety.   Exercise Prescription Goal: Initial exercise prescription builds to 30-45 minutes a day of aerobic activity, 2-3 days per week.  Home exercise guidelines will be given to patient during program as part of exercise prescription that the participant will acknowledge.  Education: Aerobic Exercise: - Group verbal and visual presentation on the components of exercise prescription. Introduces F.I.T.T principle from ACSM for exercise prescriptions.  Reviews F.I.T.T. principles of aerobic  exercise including progression. Written material given at graduation.   Education: Resistance Exercise: - Group verbal and visual presentation on the components of exercise prescription. Introduces F.I.T.T principle from ACSM for exercise prescriptions  Reviews F.I.T.T. principles of resistance exercise including progression. Written material given at graduation.    Education: Exercise & Equipment Safety: - Individual verbal instruction and demonstration of equipment use and safety with use of the equipment. Flowsheet Row Pulmonary Rehab from 03/28/2023 in [facility] Cardiac and Pulmonary Rehab  Date 03/28/23  Educator [facility]  Instruction Review Code 1- Verbalizes Understanding       Education: Exercise Physiology & General Exercise Guidelines: - Group verbal and written instruction with models to review the exercise physiology of the cardiovascular system and associated critical values. Provides general exercise guidelines with specific guidelines to those with heart or lung disease.  Flowsheet Row Pulmonary Rehab from 03/28/2023 in [facility] Cardiac and Pulmonary Rehab  Education need identified 03/28/23       Education: Flexibility, Balance, Mind/Body Relaxation: - Group verbal and visual presentation with interactive activity on the components of exercise prescription. Introduces F.I.T.T principle from ACSM for exercise prescriptions. Reviews F.I.T.T. principles of flexibility and balance exercise training including progression. Also discusses the mind body connection.  Reviews various relaxation techniques to help reduce and manage stress (i.e. Deep breathing, progressive muscle relaxation, and visualization). Balance handout provided to take home. Written material given at graduation.   Activity Barriers & Risk Stratification:  Activity Barriers &  Cardiac Risk Stratification - 03/28/23 1647       Activity Barriers & Cardiac Risk Stratification   Activity Barriers Shortness of Breath              6 Minute Walk:  6 Minute Walk     Row Name 03/28/23 1644         6 Minute Walk   Phase Initial     Distance 765 feet     Walk Time 4.75 minutes     # of Rest Breaks 2     MPH 1.83     METS 1.9     RPE 13     Perceived Dyspnea  3     VO2 Peak 6.64     Symptoms No     Resting HR 87 bpm     Resting BP 122/60     Resting Oxygen Saturation  94 %     Exercise Oxygen Saturation  during 6 min walk 80 %     Max Ex. HR 116 bpm     Max Ex. BP 148/64     2 Minute Post BP 126/80       Interval HR   1 Minute HR 104     2 Minute HR 101     3 Minute HR 105     4 Minute HR 103     5 Minute HR 112     6 Minute HR 116     2 Minute Post HR 93     Interval Heart Rate? Yes       Interval Oxygen   Interval Oxygen? Yes     Baseline Oxygen Saturation % 94 %     1 Minute Oxygen Saturation % 90 %     1 Minute Liters of Oxygen 3 L     2 Minute Oxygen Saturation % 87 %     2 Minute Liters of Oxygen 3 L     3 Minute Oxygen Saturation % 81 %     3 Minute Liters of Oxygen 3 L     4 Minute Oxygen Saturation % 83 %     4 Minute Liters of Oxygen 3 L     5 Minute Oxygen Saturation % 80 %     5 Minute Liters of Oxygen 3 L     6 Minute Oxygen Saturation % 81 %     6 Minute Liters of Oxygen 3 L     2 Minute Post Oxygen Saturation % 89 %     2 Minute Post Liters of Oxygen 3 L             Oxygen Initial Assessment:  Oxygen Initial Assessment - 03/28/23 1700       Home Oxygen   Home Oxygen Device Home Concentrator;Portable Concentrator    Sleep Oxygen Prescription Continuous    Liters per minute 3    Home Exercise Oxygen Prescription Continuous    Liters per minute 3    Home Resting Oxygen Prescription Continuous    Liters per minute 3    Compliance with Home Oxygen Use Yes      Initial 6 min Walk   Oxygen Used Continuous;E-Tanks    Liters per minute 3      Program Oxygen Prescription   Program Oxygen Prescription Continuous;E-Tanks    Liters per minute 3       Intervention   Short Term Goals To learn and exhibit compliance with exercise, home and  travel O2 prescription;To learn and understand importance of monitoring SPO2 with pulse oximeter and demonstrate accurate use of the pulse oximeter.;To learn and understand importance of maintaining oxygen saturations>88%;To learn and demonstrate proper pursed lip breathing techniques or other breathing techniques. ;To learn and demonstrate proper use of respiratory medications    Long  Term Goals Exhibits compliance with exercise, home  and travel O2 prescription;Verbalizes importance of monitoring SPO2 with pulse oximeter and return demonstration;Maintenance of O2 saturations>88%;Exhibits proper breathing techniques, such as pursed lip breathing or other method taught during program session;Compliance with respiratory medication;Demonstrates proper use of MDI's             Oxygen Re-Evaluation:  Oxygen Re-Evaluation     Row Name 04/13/23 1352 04/25/23 1346           Program Oxygen Prescription   Program Oxygen Prescription -- Continuous;E-Tanks      Liters per minute -- 3  4 liters if needed        Home Oxygen   Home Oxygen Device -- Home Concentrator;Portable Concentrator      Sleep Oxygen Prescription -- Continuous      Liters per minute -- 3      Home Exercise Oxygen Prescription -- Continuous      Liters per minute -- 3      Home Resting Oxygen Prescription -- Continuous      Liters per minute -- 3      Compliance with Home Oxygen Use -- Yes        Goals/Expected Outcomes   Short Term Goals -- To learn and demonstrate proper pursed lip breathing techniques or other breathing techniques.       Long  Term Goals -- Exhibits proper breathing techniques, such as pursed lip breathing or other method taught during program session      Comments Reviewed PLB technique with pt.  Talked about how it works and it's importance in maintaining their exercise saturations. Informed patient how to perform  the Pursed Lipped breathing technique. Told patient to Inhale through the nose and out the mouth with pursed lips to keep their airways open, help oxygenate them better, practice when at rest or doing strenuous activity. Patient Verbalizes understanding of technique and will work on and be reiterated during [facility].      Goals/Expected Outcomes Short: Become more profiecient at using PLB.   Long: Become independent at using PLB. Short: use PLB with exertion. Long: use PLB on exertion proficiently and independently.               Oxygen Discharge (Final Oxygen Re-Evaluation):  Oxygen Re-Evaluation - 04/25/23 1346       Program Oxygen Prescription   Program Oxygen Prescription Continuous;E-Tanks    Liters per minute 3   4 liters if needed     Home Oxygen   Home Oxygen Device Home Concentrator;Portable Concentrator    Sleep Oxygen Prescription Continuous    Liters per minute 3    Home Exercise Oxygen Prescription Continuous    Liters per minute 3    Home Resting Oxygen Prescription Continuous    Liters per minute 3    Compliance with Home Oxygen Use Yes      Goals/Expected Outcomes   Short Term Goals To learn and demonstrate proper pursed lip breathing techniques or other breathing techniques.     Long  Term Goals Exhibits proper breathing techniques, such as pursed lip breathing or other method taught during program session    Comments  Informed patient how to perform the Pursed Lipped breathing technique. Told patient to Inhale through the nose and out the mouth with pursed lips to keep their airways open, help oxygenate them better, practice when at rest or doing strenuous activity. Patient Verbalizes understanding of technique and will work on and be reiterated during [facility].    Goals/Expected Outcomes Short: use PLB with exertion. Long: use PLB on exertion proficiently and independently.             Initial Exercise Prescription:  Initial Exercise Prescription - 03/28/23  1600       Date of Initial Exercise RX and Referring Provider   Date 03/28/23    Referring Provider [name]      Oxygen   Oxygen Continuous    Liters 3    Maintain Oxygen Saturation 88% or higher      Treadmill   MPH 11.4    Grade 0    Minutes 15    METs 2.02      Recumbant Bike   Level 1    RPM 50    Watts 15    Minutes 15      NuStep   Level 1    SPM 80    Minutes 15    METs 1.9      Arm Ergometer   Level 1    RPM 30    Minutes 15    METs 1.9      REL-XR   Level 1    Speed 50    Minutes 15    METs 1.9      Track   Laps 20    Minutes 15    METs 2.09      Prescription Details   Frequency (times per week) 3    Duration Progress to 30 minutes of continuous aerobic without signs/symptoms of physical distress      Intensity   THRR 40-80% of Max Heartrate 112-130    Ratings of Perceived Exertion 11-13    Perceived Dyspnea 0-4      Progression   Progression Continue to progress workloads to maintain intensity without signs/symptoms of physical distress.      Resistance Training   Training Prescription Yes    Weight 5    Reps 10-15             Perform Capillary Blood Glucose checks as needed.  Exercise Prescription Changes:   Exercise Prescription Changes     Row Name 03/28/23 1600 04/14/23 1600 04/25/23 1400         Response to Exercise   Blood Pressure (Admit) 122/60 134/66 140/68     Blood Pressure (Exercise) 148/64 162/70 178/82     Blood Pressure (Exit) 126/80 134/68 112/64     Heart Rate (Admit) 87 bpm 99 bpm 88 bpm     Heart Rate (Exercise) 116 bpm 127 bpm 132 bpm     Heart Rate (Exit) 93 bpm 80 bpm 114 bpm     Oxygen Saturation (Admit) 94 % 93 % 94 %     Oxygen Saturation (Exercise) 80 % 86 % 88 %     Oxygen Saturation (Exit) 95 % 93 % 93 %     Rating of Perceived Exertion (Exercise) 13 12 13      Perceived Dyspnea (Exercise) 3 2 3      Symptoms none none none     Comments 6 MWT resutls First two sessions of rehab --      Duration --  Progress to 30 minutes of  aerobic without signs/symptoms of physical distress Progress to 30 minutes of  aerobic without signs/symptoms of physical distress     Intensity -- THRR unchanged THRR unchanged       Progression   Progression -- Continue to progress workloads to maintain intensity without signs/symptoms of physical distress. Continue to progress workloads to maintain intensity without signs/symptoms of physical distress.     Average METs -- 3.5 2.94       Resistance Training   Training Prescription -- Yes Yes     Weight -- 5 lb 5 lb     Reps -- 10-15 10-15       Interval Training   Interval Training -- No No       Oxygen   Oxygen -- Continuous Continuous     Liters -- 3 3       Treadmill   MPH -- -- 1.4     Grade -- -- 0     Minutes -- -- 15     METs -- -- 2.07       Recumbant Bike   Level -- 3 3     Watts -- 15 15     Minutes -- 15 15       NuStep   Level -- 4 4     Minutes -- 30 15     METs -- 3.4 3.5       Oxygen   Maintain Oxygen Saturation -- 88% or higher 88% or higher              Exercise Comments:   Exercise Comments     Row Name 04/13/23 1350           Exercise Comments First full day of exercise!  Patient was oriented to gym and equipment including functions, settings, policies, and procedures.  Patient's individual exercise prescription and treatment plan were reviewed.  All starting workloads were established based on the results of the 6 minute walk test done at initial orientation visit.  The plan for exercise progression was also introduced and progression will be customized based on patient's performance and goals.                Exercise Goals and Review:   Exercise Goals     Row Name 03/28/23 1656             Exercise Goals   Increase Physical Activity Yes       Intervention Provide advice, education, support and counseling about physical activity/exercise needs.;Develop an individualized exercise  prescription for aerobic and resistive training based on initial evaluation findings, risk stratification, comorbidities and participant's personal goals.       Expected Outcomes Short Term: Attend rehab on a regular basis to increase amount of physical activity.;Long Term: Add in home exercise to make exercise part of routine and to increase amount of physical activity.;Long Term: Exercising regularly at least 3-5 days a week.       Increase Strength and Stamina Yes       Intervention Provide advice, education, support and counseling about physical activity/exercise needs.;Develop an individualized exercise prescription for aerobic and resistive training based on initial evaluation findings, risk stratification, comorbidities and participant's personal goals.       Expected Outcomes Short Term: Increase workloads from initial exercise prescription for resistance, speed, and METs.;Short Term: Perform resistance training exercises routinely during rehab and add in resistance training at home;Long Term: Improve cardiorespiratory fitness, muscular  endurance and strength as measured by increased METs and functional capacity ( )       Able to understand and use rate of perceived exertion (RPE) scale Yes       Intervention Provide education and explanation on how to use RPE scale       Expected Outcomes Short Term: Able to use RPE daily in rehab to express subjective intensity level       Able to understand and use Dyspnea scale Yes       Intervention Provide education and explanation on how to use Dyspnea scale       Expected Outcomes Short Term: Able to use Dyspnea scale daily in rehab to express subjective sense of shortness of breath during exertion;Long Term: Able to use Dyspnea scale to guide intensity level when exercising independently       Knowledge and understanding of Target Heart Rate Range (THRR) Yes       Intervention Provide education and explanation of THRR including how the numbers were  predicted and where they are located for reference       Expected Outcomes Short Term: Able to state/look up THRR;Long Term: Able to use THRR to govern intensity when exercising independently;Short Term: Able to use daily as guideline for intensity in rehab       Able to check pulse independently Yes       Intervention Provide education and demonstration on how to check pulse in carotid and radial arteries.;Review the importance of being able to check your own pulse for safety during independent exercise       Expected Outcomes Short Term: Able to explain why pulse checking is important during independent exercise;Long Term: Able to check pulse independently and accurately       Understanding of Exercise Prescription Yes       Intervention Provide education, explanation, and written materials on patient's individual exercise prescription       Expected Outcomes Short Term: Able to explain program exercise prescription;Long Term: Able to explain home exercise prescription to exercise independently                Exercise Goals Re-Evaluation :  Exercise Goals Re-Evaluation     Row Name 04/13/23 1351 04/14/23 1643 04/25/23 1441         Exercise Goal Re-Evaluation   Exercise Goals Review Increase Physical Activity;Able to understand and use rate of perceived exertion (RPE) scale;Knowledge and understanding of Target Heart Rate Range (THRR);Understanding of Exercise Prescription;Able to check pulse independently;Able to understand and use Dyspnea scale;Increase Strength and Stamina Increase Physical Activity;Understanding of Exercise Prescription;Increase Strength and Stamina Increase Physical Activity;Understanding of Exercise Prescription;Increase Strength and Stamina     Comments Reviewed RPE scale, THR and program prescription with pt today.  Pt voiced understanding and was given a copy of goals to take home. Johnson is off to a good start in rehab. He had an average MET level of 3.5 METs  during his first two sessions. He also was able to work up to level 4 on the T4 nustep and level 3 on the recumbent bike. He has done well with 5 lb hand weights for resistance training as well. We will continue to monitor his progress. Bethann Berkshire is doing well in rehab. He recently began walking on the treadmill at a speed of 1.4 mph with no incline. He also has continued to work at level 4 on the T4 nustep and level 3 on the recumbent bike. He has done well  with 5 lb hand weights for resistance training as well. We will continue to monitor his progress in the program.     Expected Outcomes Short: Use RPE daily to regulate intensity.  Long: Follow program prescription in THR. Short: Continue to follow current exercise prescription. Long: Continue to improve strength and stamina. Short: Continue to progressively increase treadmill workload. Long: Continue to improve strength and stamina.              Discharge Exercise Prescription (Final Exercise Prescription Changes):  Exercise Prescription Changes - 04/25/23 1400       Response to Exercise   Blood Pressure (Admit) 140/68    Blood Pressure (Exercise) 178/82    Blood Pressure (Exit) 112/64    Heart Rate (Admit) 88 bpm    Heart Rate (Exercise) 132 bpm    Heart Rate (Exit) 114 bpm    Oxygen Saturation (Admit) 94 %    Oxygen Saturation (Exercise) 88 %    Oxygen Saturation (Exit) 93 %    Rating of Perceived Exertion (Exercise) 13    Perceived Dyspnea (Exercise) 3    Symptoms none    Duration Progress to 30 minutes of  aerobic without signs/symptoms of physical distress    Intensity THRR unchanged      Progression   Progression Continue to progress workloads to maintain intensity without signs/symptoms of physical distress.    Average METs 2.94      Resistance Training   Training Prescription Yes    Weight 5 lb    Reps 10-15      Interval Training   Interval Training No      Oxygen   Oxygen Continuous    Liters 3      Treadmill    MPH 1.4    Grade 0    Minutes 15    METs 2.07      Recumbant Bike   Level 3    Watts 15    Minutes 15      NuStep   Level 4    Minutes 15    METs 3.5      Oxygen   Maintain Oxygen Saturation 88% or higher             Nutrition:  Target Goals: Understanding of nutrition guidelines, daily intake of sodium 1500mg , cholesterol 200mg , calories 30% from fat and 7% or less from saturated fats, daily to have 5 or more servings of fruits and vegetables.  Education: All About Nutrition: -Group instruction provided by verbal, written material, interactive activities, discussions, models, and posters to present general guidelines for heart healthy nutrition including fat, fiber, MyPlate, the role of sodium in heart healthy nutrition, utilization of the nutrition label, and utilization of this knowledge for meal planning. Follow up email sent as well. Written material given at graduation. Flowsheet Row Pulmonary Rehab from 03/28/2023 in [facility] Cardiac and Pulmonary Rehab  Education need identified 03/28/23       Biometrics:  Pre Biometrics - 03/28/23 1657       Pre Biometrics   Height 5\' 4"  (1.626 m)    Weight 200 lb 9.6 oz (91 kg)    Waist Circumference 45 inches    Hip Circumference 43 inches    Waist to Hip Ratio 1.05 %    BMI (Calculated) 34.42    Single Leg Stand 8.06 seconds              Nutrition Therapy Plan and Nutrition Goals:  Nutrition Therapy & Goals -  03/28/23 1818       Intervention Plan   Intervention Prescribe, educate and counsel regarding individualized specific dietary modifications aiming towards targeted core components such as weight, hypertension, lipid management, diabetes, heart failure and other comorbidities.    Expected Outcomes Short Term Goal: Understand basic principles of dietary content, such as calories, fat, sodium, cholesterol and nutrients.;Short Term Goal: A plan has been developed with personal nutrition goals set during  dietitian appointment.;Long Term Goal: Adherence to prescribed nutrition plan.             Nutrition Assessments:  MEDIFICTS Score Key: ?70 Need to make dietary changes  40-70 Heart Healthy Diet ? 40 Therapeutic Level Cholesterol Diet  Flowsheet Row Pulmonary Rehab from 03/28/2023 in [facility] Cardiac and Pulmonary Rehab  Picture Your Plate Total Score on Admission 53      Picture Your Plate Scores: <16 Unhealthy dietary pattern with much room for improvement. 41-50 Dietary pattern unlikely to meet recommendations for good health and room for improvement. 51-60 More healthful dietary pattern, with some room for improvement.  >60 Healthy dietary pattern, although there may be some specific behaviors that could be improved.   Nutrition Goals Re-Evaluation:  Nutrition Goals Re-Evaluation     Row Name 04/25/23 1348             Goals   Current Weight 193 lb (87.5 kg)       Comment Patient was informed on why it is important to maintain a balanced diet when dealing with Respiratory issues. Explained that it takes a lot of energy to breath and when they are short of breath often they will need to have a good diet to help keep up with the calories they are expending for breathing.       Expected Outcome Short: Choose and plan snacks accordingly to patients caloric intake to improve breathing. Long: Maintain a diet independently that meets their caloric intake to aid in daily shortness of breath.                Nutrition Goals Discharge (Final Nutrition Goals Re-Evaluation):  Nutrition Goals Re-Evaluation - 04/25/23 1348       Goals   Current Weight 193 lb (87.5 kg)    Comment Patient was informed on why it is important to maintain a balanced diet when dealing with Respiratory issues. Explained that it takes a lot of energy to breath and when they are short of breath often they will need to have a good diet to help keep up with the calories they are expending for breathing.     Expected Outcome Short: Choose and plan snacks accordingly to patients caloric intake to improve breathing. Long: Maintain a diet independently that meets their caloric intake to aid in daily shortness of breath.             Psychosocial: Target Goals: Acknowledge presence or absence of significant depression and/or stress, maximize coping skills, provide positive support system. Participant is able to verbalize types and ability to use techniques and skills needed for reducing stress and depression.   Education: Stress, Anxiety, and Depression - Group verbal and visual presentation to define topics covered.  Reviews how body is impacted by stress, anxiety, and depression.  Also discusses healthy ways to reduce stress and to treat/manage anxiety and depression.  Written material given at graduation. Flowsheet Row Pulmonary Rehab from 03/31/2018 in [facility] Cardiac and Pulmonary Rehab  Date 02/08/18  Educator [facility]  Instruction Review Code 1- [employer]  Understanding       Education: Sleep Hygiene -Provides group verbal and written instruction about how sleep can affect your health.  Define sleep hygiene, discuss sleep cycles and impact of sleep habits. Review good sleep hygiene tips.    Initial Review & Psychosocial Screening:  Initial Psych Review & Screening - 03/21/23 1116       Initial Review   Current issues with Current Psychotropic Meds;Current Depression;Current Anxiety/Panic      Family Dynamics   Good Support System? Yes   daughters  live close, brother in law takes him to appts     Barriers   Psychosocial barriers to participate in program There are no identifiable barriers or psychosocial needs.      Screening Interventions   Interventions Encouraged to exercise;To provide support and resources with identified psychosocial needs;Provide feedback about the scores to participant    Expected Outcomes Short Term goal: Utilizing psychosocial counselor, staff and physician to  assist with identification of specific Stressors or current issues interfering with healing process. Setting desired goal for each stressor or current issue identified.;Long Term Goal: Stressors or current issues are controlled or eliminated.;Short Term goal: Identification and review with participant of any Quality of Life or Depression concerns found by scoring the questionnaire.;Long Term goal: The participant improves quality of Life and PHQ9 Scores as seen by post scores and/or verbalization of changes             Quality of Life Scores:  Scores of 19 and below usually indicate a poorer quality of life in these areas.  A difference of  2-3 points is a clinically meaningful difference.  A difference of 2-3 points in the total score of the Quality of Life Index has been associated with significant improvement in overall quality of life, self-image, physical symptoms, and general health in studies assessing change in quality of life.  PHQ-9: Review Flowsheet  More data exists      04/25/2023 03/28/2023 07/07/2018 05/22/2018 04/19/2018  Depression screen PHQ 2/9  Decreased Interest 2 3 2 2 3   Down, Depressed, Hopeless 3 3 2 2 3   PHQ - 2 Score 5 6 4 4 6   Altered sleeping 2 2 2 1 3   Tired, decreased energy 2 2 2 1 3   Change in appetite 2 2 2 1 3   Feeling bad or failure about yourself  3 2 2 1 3   Trouble concentrating 2 2 0 0 1  Moving slowly or fidgety/restless 2 2 0 0 0  Suicidal thoughts 0 0 0 0 0  PHQ-9 Score 18 18 12 8 19   Difficult doing work/chores Somewhat difficult Somewhat difficult - - -   Interpretation of Total Score  Total Score Depression Severity:  1-4 = Minimal depression, 5-9 = Mild depression, 10-14 = Moderate depression, 15-19 = Moderately severe depression, 20-27 = Severe depression   Psychosocial Evaluation and Intervention:  Psychosocial Evaluation - 03/21/23 1125       Psychosocial Evaluation & Interventions   Comments Espn has no barriers to attending the  program. He lives alone. HIs daughter and brother in law are his support. He has history of depression/anxiety and is on medications managed by his primary physician. He wants to see if he can improve his breathing and be able to perform activities longer than he is at this time. He does wear oxygen all day and night and is compliant. He is ready to return to the program. He was here in 2019.  He is still greiving the loss of his wife in 2019;they had been married 40 years.    Expected Outcomes [facility] [place] will attend all scheduled sessions, he will continue to work on controlling his depression/anxiety symptoms. He will follow the exercise progression to work on improving his breathing. LTG Ura maintiais control of his depression/anxiety symptoms. He is able to be a bit more active without increased shortness of breath.    Continue Psychosocial Services  Follow up required by staff             Psychosocial Re-Evaluation:  Psychosocial Re-Evaluation     Row Name 04/25/23 1353             Psychosocial Re-Evaluation   Current issues with Current Anxiety/Panic;History of Depression;Current Depression;Current Psychotropic Meds       Comments Reviewed patient health questionnaire (PHQ-9) with patient for follow up. Previously, patients score indicated signs/symptoms of depression.  Reviewed to see if patient is improving symptom wise while in program.  Score stayed the same and patient states that it is because he has some depression. He is going to meet with his doctor about his depression.       Expected Outcomes Short: Continue to work toward an improvement in PHQ9 scores by attending [facility] regularly. Long: Continue to improve stress and depression coping skills by talking with staff and attending [facility] regularly and work toward a positive mental state.       Continue Psychosocial Services  Follow up required by staff                Psychosocial Discharge (Final  Psychosocial Re-Evaluation):  Psychosocial Re-Evaluation - 04/25/23 1353       Psychosocial Re-Evaluation   Current issues with Current Anxiety/Panic;History of Depression;Current Depression;Current Psychotropic Meds    Comments Reviewed patient health questionnaire (PHQ-9) with patient for follow up. Previously, patients score indicated signs/symptoms of depression.  Reviewed to see if patient is improving symptom wise while in program.  Score stayed the same and patient states that it is because he has some depression. He is going to meet with his doctor about his depression.    Expected Outcomes Short: Continue to work toward an improvement in PHQ9 scores by attending [facility] regularly. Long: Continue to improve stress and depression coping skills by talking with staff and attending [facility] regularly and work toward a positive mental state.    Continue Psychosocial Services  Follow up required by staff             Education: Education Goals: Education classes will be provided on a weekly basis, covering required topics. Participant will state understanding/return demonstration of topics presented.  Learning Barriers/Preferences:   General Pulmonary Education Topics:  Infection Prevention: - Provides verbal and written material to individual with discussion of infection control including proper hand washing and proper equipment cleaning during exercise session. Flowsheet Row Pulmonary Rehab from 03/28/2023 in [facility] Cardiac and Pulmonary Rehab  Date 03/28/23  Educator [facility]  Instruction Review Code 1- Verbalizes Understanding       Falls Prevention: - Provides verbal and written material to individual with discussion of falls prevention and safety. Flowsheet Row Pulmonary Rehab from 03/28/2023 in [facility] Cardiac and Pulmonary Rehab  Date 03/28/23  Educator [facility]  Instruction Review Code 5- Refused Teaching       Chronic Lung Disease Review: - Group verbal instruction  with posters, models, PowerPoint presentations and videos,  to review new updates, new respiratory medications, new advancements in  procedures and treatments. Providing information on websites and "800" numbers for continued self-education. Includes information about supplement oxygen, available portable oxygen systems, continuous and intermittent flow rates, oxygen safety, concentrators, and Medicare reimbursement for oxygen. Explanation of Pulmonary Drugs, including class, frequency, complications, importance of spacers, rinsing mouth after steroid MDI's, and proper cleaning methods for nebulizers. Review of basic lung anatomy and physiology related to function, structure, and complications of lung disease. Review of risk factors. Discussion about methods for diagnosing sleep apnea and types of masks and machines for OSA. Includes a review of the use of types of environmental controls: home humidity, furnaces, filters, dust mite/pet prevention, HEPA vacuums. Discussion about weather changes, air quality and the benefits of nasal washing. Instruction on Warning signs, infection symptoms, calling MD promptly, preventive modes, and value of vaccinations. Review of effective airway clearance, coughing and/or vibration techniques. Emphasizing that all should Create an Action Plan. Written material given at graduation. Flowsheet Row Pulmonary Rehab from 03/28/2023 in [facility] Cardiac and Pulmonary Rehab  Education need identified 03/28/23       AED/CPR: - Group verbal and written instruction with the use of models to demonstrate the basic use of the AED with the basic ABC's of resuscitation. Flowsheet Row Pulmonary Rehab from 03/31/2018 in [facility] Cardiac and Pulmonary Rehab  Date 02/17/18  Educator [facility]  Instruction Review Code 1- Verbalizes Understanding        Anatomy and Cardiac Procedures: - Group verbal and visual presentation and models provide information about basic cardiac anatomy and function. Reviews  the testing methods done to diagnose heart disease and the outcomes of the test results. Describes the treatment choices: Medical Management, Angioplasty, or Coronary Bypass Surgery for treating various heart conditions including Myocardial Infarction, Angina, Valve Disease, and Cardiac Arrhythmias.  Written material given at graduation.   Medication Safety: - Group verbal and visual instruction to review commonly prescribed medications for heart and lung disease. Reviews the medication, class of the drug, and side effects. Includes the steps to properly store meds and maintain the prescription regimen.  Written material given at graduation. Flowsheet Row Pulmonary Rehab from 03/31/2018 in [facility] Cardiac and Pulmonary Rehab  Date 02/03/18  Educator [facility]  Instruction Review Code 1- Verbalizes Understanding       Other: -Provides group and verbal instruction on various topics (see comments)   Knowledge Questionnaire Score:  Knowledge Questionnaire Score - 03/28/23 1702       Knowledge Questionnaire Score   Pre Score 12/18              Core Components/Risk Factors/Patient Goals at Admission:  Personal Goals and Risk Factors at Admission - 03/28/23 1657       Core Components/Risk Factors/Patient Goals on Admission    Weight Management Yes    Intervention Weight Management: Develop a combined nutrition and exercise program designed to reach desired caloric intake, while maintaining appropriate intake of nutrient and fiber, sodium and fats, and appropriate energy expenditure required for the weight goal.;Weight Management: Provide education and appropriate resources to help participant work on and attain dietary goals.;Weight Management/Obesity: Establish reasonable short term and long term weight goals.    Admit Weight 200 lb 9.6 oz (91 kg)    Goal Weight: Short Term 190 lb (86.2 kg)    Goal Weight: Long Term 175 lb (79.4 kg)    Expected Outcomes Short Term: Continue to assess and  modify interventions until short term weight is achieved;Long Term: Adherence to nutrition and physical activity/exercise program aimed  toward attainment of established weight goal;Weight Loss: Understanding of general recommendations for a balanced deficit meal plan, which promotes 1-2 lb weight loss per week and includes a negative energy balance of [phone] kcal/d;Understanding recommendations for meals to include 15-35% energy as protein, 25-35% energy from fat, 35-60% energy from carbohydrates, less than 200mg  of dietary cholesterol, 20-35 gm of total fiber daily;Understanding of distribution of calorie intake throughout the day with the consumption of 4-5 meals/snacks    Improve shortness of breath with ADL's Yes    Intervention Provide education, individualized exercise plan and daily activity instruction to help decrease symptoms of SOB with activities of daily living.    Expected Outcomes Short Term: Improve cardiorespiratory fitness to achieve a reduction of symptoms when performing ADLs;Long Term: Be able to perform more ADLs without symptoms or delay the onset of symptoms    Increase knowledge of respiratory medications and ability to use respiratory devices properly  Yes    Intervention Provide education and demonstration as needed of appropriate use of medications, inhalers, and oxygen therapy.    Expected Outcomes Short Term: Achieves understanding of medications use. Understands that oxygen is a medication prescribed by physician. Demonstrates appropriate use of inhaler and oxygen therapy.;Long Term: Maintain appropriate use of medications, inhalers, and oxygen therapy.    Diabetes Yes   no meds, getting A1C checked soon to confirm proper diagnosis   Intervention Provide education about signs/symptoms and action to take for hypo/hyperglycemia.;Provide education about proper nutrition, including hydration, and aerobic/resistive exercise prescription along with prescribed medications to achieve  blood glucose in normal ranges: Fasting glucose 65-99 mg/dL    Expected Outcomes Short Term: Participant verbalizes understanding of the signs/symptoms and immediate care of hyper/hypoglycemia, proper foot care and importance of medication, aerobic/resistive exercise and nutrition plan for blood glucose control.;Long Term: Attainment of HbA1C < 7%.    Heart Failure Yes    Intervention Provide a combined exercise and nutrition program that is supplemented with education, support and counseling about heart failure. Directed toward relieving symptoms such as shortness of breath, decreased exercise tolerance, and extremity edema.    Expected Outcomes Improve functional capacity of life;Short term: Attendance in program 2-3 days a week with increased exercise capacity. Reported lower sodium intake. Reported increased fruit and vegetable intake. Reports medication compliance.;Short term: Daily weights obtained and reported for increase. Utilizing diuretic protocols set by physician.;Long term: Adoption of self-care skills and reduction of barriers for early signs and symptoms recognition and intervention leading to self-care maintenance.    Hypertension Yes    Intervention Provide education on lifestyle modifcations including regular physical activity/exercise, weight management, moderate sodium restriction and increased consumption of fresh fruit, vegetables, and low fat dairy, alcohol moderation, and smoking cessation.;Monitor prescription use compliance.    Expected Outcomes Short Term: Continued assessment and intervention until BP is < 140/17mm HG in hypertensive participants. < 130/34mm HG in hypertensive participants with diabetes, heart failure or chronic kidney disease.;Long Term: Maintenance of blood pressure at goal levels.    Lipids Yes    Intervention Provide education and support for participant on nutrition & aerobic/resistive exercise along with prescribed medications to achieve LDL 70mg , HDL  >40mg .    Expected Outcomes Short Term: Participant states understanding of desired cholesterol values and is compliant with medications prescribed. Participant is following exercise prescription and nutrition guidelines.;Long Term: Cholesterol controlled with medications as prescribed, with individualized exercise RX and with personalized nutrition plan. Value goals: LDL < 70mg , HDL > 40 mg.  Stress Yes    Intervention Offer individual and/or small group education and counseling on adjustment to heart disease, stress management and health-related lifestyle change. Teach and support self-help strategies.;Refer participants experiencing significant psychosocial distress to appropriate mental health specialists for further evaluation and treatment. When possible, include family members and significant others in education/counseling sessions.    Expected Outcomes Short Term: Participant demonstrates changes in health-related behavior, relaxation and other stress management skills, ability to obtain effective social support, and compliance with psychotropic medications if prescribed.;Long Term: Emotional wellbeing is indicated by absence of clinically significant psychosocial distress or social isolation.             Education:Diabetes - Individual verbal and written instruction to review signs/symptoms of diabetes, desired ranges of glucose level fasting, after meals and with exercise. Acknowledge that pre and post exercise glucose checks will be done for 3 sessions at entry of program.   Know Your Numbers and Heart Failure: - Group verbal and visual instruction to discuss disease risk factors for cardiac and pulmonary disease and treatment options.  Reviews associated critical values for Overweight/Obesity, Hypertension, Cholesterol, and Diabetes.  Discusses basics of heart failure: signs/symptoms and treatments.  Introduces Heart Failure Zone chart for action plan for heart failure.  Written  material given at graduation.   Core Components/Risk Factors/Patient Goals Review:   Goals and Risk Factor Review     Row Name 04/25/23 1347             Core Components/Risk Factors/Patient Goals Review   Personal Goals Review Improve shortness of breath with ADL's       Review Spoke to patient about their shortness of breath and what they can do to improve. Patient has been informed of breathing techniques when starting the program. Patient is informed to tell staff if they have had any med changes and that certain meds they are taking or not taking can be causing shortness of breath.       Expected Outcomes Short: Attend [facility] regularly to improve shortness of breath with ADL's. Long: maintain independence with ADL's                Core Components/Risk Factors/Patient Goals at Discharge (Final Review):   Goals and Risk Factor Review - 04/25/23 1347       Core Components/Risk Factors/Patient Goals Review   Personal Goals Review Improve shortness of breath with ADL's    Review Spoke to patient about their shortness of breath and what they can do to improve. Patient has been informed of breathing techniques when starting the program. Patient is informed to tell staff if they have had any med changes and that certain meds they are taking or not taking can be causing shortness of breath.    Expected Outcomes Short: Attend [facility] regularly to improve shortness of breath with ADL's. Long: maintain independence with ADL's             ITP Comments:  ITP Comments     Row Name 03/21/23 1132 03/28/23 1643 03/29/23 1132 04/06/23 0904 04/13/23 1350   ITP Comments Virtual orientation call completed today. he has an appointment on Date: 03/28/2023  for EP eval and gym Orientation.  Documentation of diagnosis can be found in [facility] Date: 03/01/2023 . Completed and gym orientation. Initial ITP created and sent for review to Dr. Jinny Sanders, Medical Director. Patient called to  let us know he is scheduled for a heart cath on 4/15, and has to delay his  start date with pulmonary rehab with [place]. Patient will need clearance to return- patient aware and will call us to let us know when he is able to return with clearance. 30 day review completed. ITP sent to Dr. Jinny Sanders, Medical Director of  Pulmonary Rehab. Continue with ITP unless changes are made by physician.  Delayed start, no exercise sessions yet. First full day of exercise!  Patient was oriented to gym and equipment including functions, settings, policies, and procedures.  Patient's individual exercise prescription and treatment plan were reviewed.  All starting workloads were established based on the results of the 6 minute walk test done at initial orientation visit.  The plan for exercise progression was also introduced and progression will be customized based on patient's performance and goals.    Row Name 05/04/23 0830           ITP Comments 30 Day review completed. Medical Director ITP review done, changes made as directed, and signed approval by Medical Director.   new to program                Comments:

## 2023-05-04 NOTE — Progress Notes (Signed)
Daily Session Note  Patient Details  Name: Dynell Diskin MRN: [id number] Date of Birth: 1951-12-03 Referring Provider:   Flowsheet Row Pulmonary Rehab from 03/28/2023 in [facility] Cardiac and Pulmonary Rehab  Referring Provider [name]       Encounter Date: 05/04/2023  Check In:  Session Check In - 05/04/23 1340       Check-In   Supervising physician immediately available to respond to emergencies See telemetry face sheet for immediately available ER MD    Location [facility]-Cardiac & Pulmonary Rehab    Staff Present Susann Givens, RN Atilano Median, RN, ADN;Laureen Manson Passey, BS, RRT, CPFT;Jessica Juanetta Gosling, MA, RCEP, CCRP, CCET    Virtual Visit No    Medication changes reported     Yes    Comments prednisone 10mg     Fall or balance concerns reported    No    Warm-up and Cool-down Performed on first and last piece of equipment    Resistance Training Performed Yes    VAD Patient? No    PAD/SET Patient? No      Pain Assessment   Currently in Pain? No/denies                Social History   Tobacco Use  Smoking Status Former   Packs/day: 3.00   Years: 42.00   Additional pack years: 0.00   Total pack years: 126.00   Types: Cigarettes   Quit date: 02/10/2019   Years since quitting: 4.2  Smokeless Tobacco Never  Tobacco Comments   25 months ago most smoked 4 packs a day .    Goals Met:  Independence with exercise equipment Exercise tolerated well No report of concerns or symptoms today Strength training completed today  Goals Unmet:  Not Applicable  Comments: Pt able to follow exercise prescription today without complaint.  Will continue to monitor for progression.    Dr. Bethann Punches is Medical Director for [facility] Cardiac Rehabilitation.  Dr. Vida Rigger is Medical Director for [facility] Pulmonary Rehabilitation.

## 2023-05-05 ENCOUNTER — Encounter: Payer: Medicare Other | Admitting: *Deleted

## 2023-05-05 DIAGNOSIS — J449 Chronic obstructive pulmonary disease, unspecified: Secondary | ICD-10-CM

## 2023-05-05 NOTE — Progress Notes (Signed)
Daily Session Note  Patient Details  Name: Anush Udy MRN: [id number] Date of Birth: 1951/08/22 Referring Provider:   Flowsheet Row Pulmonary Rehab from 03/28/2023 in [facility] Cardiac and Pulmonary Rehab  Referring Provider [name]       Encounter Date: 05/05/2023  Check In:  Session Check In - 05/05/23 1348       Check-In   Supervising physician immediately available to respond to emergencies See telemetry face sheet for immediately available ER MD    Location [facility]-Cardiac & Pulmonary Rehab    Staff Present Lanny Hurst, RN, ADN;Jessica Juanetta Gosling, MA, RCEP, CCRP, CCET;Joseph Toxey, [place]    Virtual Visit No    Medication changes reported     No    Fall or balance concerns reported    No    Warm-up and Cool-down Performed on first and last piece of equipment    Resistance Training Performed Yes    VAD Patient? No    PAD/SET Patient? No      Pain Assessment   Currently in Pain? No/denies                Social History   Tobacco Use  Smoking Status Former   Packs/day: 3.00   Years: 42.00   Additional pack years: 0.00   Total pack years: 126.00   Types: Cigarettes   Quit date: 02/10/2019   Years since quitting: 4.2  Smokeless Tobacco Never  Tobacco Comments   25 months ago most smoked 4 packs a day .    Goals Met:  Independence with exercise equipment Exercise tolerated well No report of concerns or symptoms today Strength training completed today  Goals Unmet:  Not Applicable  Comments: Pt able to follow exercise prescription today without complaint.  Will continue to monitor for progression.    Dr. Bethann Punches is Medical Director for [facility] Cardiac Rehabilitation.  Dr. Vida Rigger is Medical Director for [facility] Pulmonary Rehabilitation.

## 2023-05-08 ENCOUNTER — Other Ambulatory Visit: Payer: Self-pay

## 2023-05-08 ENCOUNTER — Encounter: Payer: Self-pay | Admitting: Emergency Medicine

## 2023-05-08 ENCOUNTER — Emergency Department: Payer: Medicare Other

## 2023-05-08 ENCOUNTER — Emergency Department
Admission: EM | Admit: 2023-05-08 | Discharge: 2023-05-08 | Disposition: A | Discharge status: Home / Self Care | Payer: Medicare Other | Attending: Emergency Medicine | Admitting: Emergency Medicine

## 2023-05-08 DIAGNOSIS — J441 Chronic obstructive pulmonary disease with (acute) exacerbation: Secondary | ICD-10-CM | POA: Insufficient documentation

## 2023-05-08 DIAGNOSIS — E119 Type 2 diabetes mellitus without complications: Secondary | ICD-10-CM | POA: Insufficient documentation

## 2023-05-08 DIAGNOSIS — R0602 Shortness of breath: Secondary | ICD-10-CM | POA: Diagnosis present

## 2023-05-08 DIAGNOSIS — I509 Heart failure, unspecified: Secondary | ICD-10-CM | POA: Insufficient documentation

## 2023-05-08 LAB — BASIC METABOLIC PANEL
Anion gap: 9 (ref 5–15)
BUN: 16 mg/dL (ref 8–23)
CO2: 28 mmol/L (ref 22–32)
Calcium: 8.8 mg/dL — ABNORMAL LOW (ref 8.9–10.3)
Chloride: 103 mmol/L (ref 98–111)
Creatinine, Ser: 0.96 mg/dL (ref 0.61–1.24)
GFR, Estimated: >60 mL/min (ref >60)
Glucose, Bld: 126 mg/dL — ABNORMAL HIGH (ref 70–99)
Potassium: 3.5 mmol/L (ref 3.5–5.1)
Sodium: 140 mmol/L (ref 135–145)

## 2023-05-08 LAB — CBC
HCT: 41.2 % (ref 39.0–52.0)
Hemoglobin: 13 g/dL (ref 13.0–17.0)
MCH: 30 pg (ref 26.0–34.0)
MCHC: 31.6 g/dL (ref 30.0–36.0)
MCV: 94.9 fL (ref 80.0–100.0)
Platelets: 208 10*3/uL (ref 150–400)
RBC: 4.34 MIL/uL (ref 4.22–5.81)
RDW: 12.5 % (ref 11.5–15.5)
WBC: 7.2 10*3/uL (ref 4.0–10.5)
nRBC: 0 % (ref 0.0–0.2)

## 2023-05-08 MED ORDER — IPRATROPIUM-ALBUTEROL 0.5-2.5 (3) MG/3ML IN SOLN
3.0000 mL | Freq: Once | RESPIRATORY_TRACT | Status: AC
  Administered 2023-05-08: 3 mL via RESPIRATORY_TRACT
  Filled 2023-05-08: qty 3

## 2023-05-08 MED ORDER — PREDNISONE 20 MG PO TABS: 40.0000 mg | ORAL_TABLET | Freq: Every day | ORAL | 0 refills | Status: AC

## 2023-05-08 MED ORDER — DOXYCYCLINE HYCLATE 50 MG PO CAPS: 50.0000 mg | ORAL_CAPSULE | Freq: Two times a day (BID) | ORAL | 0 refills | Status: AC

## 2023-05-08 NOTE — ED Provider Notes (Signed)
[facility] Provider Note    Event Date/Time   First MD Initiated Contact with Patient 05/08/23 1147     (approximate)   History   Shortness of Breath   HPI  Leslie Sims is a 72 y.o. male   with history of COPD on home oxygen, CHF, T2DM presenting to the emergency department for evaluation of shortness of breath.  He reports that last week he had onset of cough and congestion consistent with a COPD exacerbation.  He was seen in urgent care and was prescribed Augmentin and a prednisone taper.  He reports he has completed this, but does not feel improved.  No significant worsening.  No fevers.  Continues to use his home breathing treatments 4-5 times a day.  Patient reports that he typically responds to doxycycline and a 5-day course of 40 mg of prednisone and is concerned that the medications he received were not as effective.     Physical Exam   Triage Vital Signs: ED Triage Vitals  Enc Vitals Group     BP 05/08/23 0934 (!) 147/72     Pulse Rate 05/08/23 0934 82     Resp 05/08/23 0934 18     Temp 05/08/23 0934 98.4 F (36.9 C)     Temp Source 05/08/23 0934 Oral     SpO2 05/08/23 0934 95 %     Weight 05/08/23 0935 192 lb (87.1 kg)     Height 05/08/23 0935 5\' 10"  (1.778 m)     Head Circumference --      Peak Flow --      Pain Score 05/08/23 0935 4     Pain Loc --      Pain Edu? --      Excl. in GC? --     Most recent vital signs: Vitals:   05/08/23 0934  BP: (!) 147/72  Pulse: 82  Resp: 18  Temp: 98.4 F (36.9 C)  SpO2: 95%     General: Awake, interactive  CV:  Regular rate, good peripheral perfusion.  Resp:  Respirations not significantly labored, but expiratory wheezing noted in multiple lung fields Abd:  Soft, nondistended.  Neuro:  Symmetric facial movement, fluid speech   ED Results / Procedures / Treatments   Labs (all labs ordered are listed, but only abnormal results are displayed) Labs Reviewed  BASIC METABOLIC  PANEL - Abnormal; Notable for the following components:      Result Value   Glucose, Bld 126 (*)    Calcium 8.8 (*)    All other components within normal limits  CBC     EKG EKG independently reviewed interpreted by myself (ER attending) demonstrates:  EKG demonstrates normal sinus rhythm rate of 79, PR 150, QRS 116, QTc 451  RADIOLOGY Imaging independently reviewed and interpreted by myself demonstrates:  Chest x-Redford Behrle without evidence of pneumonia  PROCEDURES:  Critical Care performed: No  Procedures   MEDICATIONS ORDERED IN ED: Medications  ipratropium-albuterol (DUONEB) 0.5-2.5 (3) MG/3ML nebulizer solution 3 mL (3 mLs Nebulization Given 05/08/23 1231)     IMPRESSION / MDM / ASSESSMENT AND PLAN / ED COURSE  I reviewed the triage vital signs and the nursing notes.  Differential diagnosis includes, but is not limited to, COPD exacerbation secondary to viral illness, pneumonia, lower suspicion CHF exacerbation, very low suspicion ACS based on history  Patient's presentation is most consistent with exacerbation of chronic illness.  Presentation seems most consistent with mild COPD flare.  Lab work  reassuring.  Patient fortunately not in respiratory distress here.  Will provide DuoNeb treatment for mild wheezing noted.  Patient does not feel that he has a severe exacerbation and does feel that he can be safely discharged.  Given patient report that he is more responsive to doxycycline and a 5-day course of prednisone, will trial this for him.  Strict return precautions provided.  Patient discharged stable condition.   Clinical Course as of 05/08/23 1250  Sun May 08, 2023  1248 Patient reevaluated after single nebulizer treatment.  He feels much improved and is ready for discharge.  Lungs clear to auscultation without appreciable wheezing after completion of treatment. [NR]    Clinical Course User Index [NR] Trinna Post, MD     FINAL CLINICAL IMPRESSION(S) / ED DIAGNOSES    Final diagnoses:  COPD exacerbation (HCC)     Rx / DC Orders   ED Discharge Orders          Ordered    predniSONE (DELTASONE) 20 MG tablet  Daily with breakfast        05/08/23 1249    doxycycline (VIBRAMYCIN) 50 MG capsule  2 times daily        05/08/23 1249             Note:  This document was prepared using Dragon voice recognition software and may include unintentional dictation errors.   Trinna Post, MD 05/08/23 1250

## 2023-05-08 NOTE — ED Triage Notes (Signed)
Pt to ED via POV from home for increased shortness of breath. Pt states that he has a history of COPD. Pt states that this has been going on for about 2 weeks. Pt is currently on antibiotics. Pt states that he finished his prednisone this morning. Pt report that he thinks the medication is not strong enough. Pt is on chronic O2 at home. Pt is able to speak in complete sentences. Pts color is WNL.

## 2023-05-08 NOTE — Discharge Instructions (Addendum)
He was in the emergency part today for evaluation of your cough.  I suspect you have a mild COPD flare, possibly related to a viral illness.  As your symptoms have not improved, I did send a prednisone course as well as a prescription for doxycycline to your pharmacy.  Please take these as directed.  Follow-up your primary care doctor within a few days for reevaluation.  Return to the ER for new or worsening symptoms.

## 2023-05-11 ENCOUNTER — Encounter: Payer: Medicare Other | Admitting: *Deleted

## 2023-05-11 DIAGNOSIS — J449 Chronic obstructive pulmonary disease, unspecified: Secondary | ICD-10-CM

## 2023-05-11 NOTE — Progress Notes (Signed)
Daily Session Note  Patient Details  Name: Leslie Sims MRN: [id number] Date of Birth: 1951/03/27 Referring Provider:   Flowsheet Row Pulmonary Rehab from 03/28/2023 in [facility] Cardiac and Pulmonary Rehab  Referring Provider [name]       Encounter Date: 05/11/2023  Check In:  Session Check In - 05/11/23 1345       Check-In   Supervising physician immediately available to respond to emergencies See telemetry face sheet for immediately available ER MD    Location [facility]-Cardiac & Pulmonary Rehab    Staff Present Bess Kinds RN, Atilano Median, RN, ADN;Joseph Hood, Algernon Huxley, BS, ACSM CEP, Exercise Physiologist    Virtual Visit No    Medication changes reported     Yes    Comments Short term additions: Prednisone 20mg , take two each day for 5 days; Doxycylcine hyclate 50 mg BID for 7 days    Fall or balance concerns reported    No    Warm-up and Cool-down Performed on first and last piece of equipment    Resistance Training Performed Yes    VAD Patient? No    PAD/SET Patient? No      Pain Assessment   Currently in Pain? No/denies                Social History   Tobacco Use  Smoking Status Former   Packs/day: 3.00   Years: 42.00   Additional pack years: 0.00   Total pack years: 126.00   Types: Cigarettes   Quit date: 02/10/2019   Years since quitting: 4.2  Smokeless Tobacco Never  Tobacco Comments   25 months ago most smoked 4 packs a day .    Goals Met:  Independence with exercise equipment Exercise tolerated well No report of concerns or symptoms today Strength training completed today  Goals Unmet:  Not Applicable  Comments: Pt able to follow exercise prescription today without complaint.  Will continue to monitor for progression.    Dr. Bethann Punches is Medical Director for [facility] Cardiac Rehabilitation.  Dr. Vida Rigger is Medical Director for [facility] Pulmonary Rehabilitation.

## 2023-05-12 ENCOUNTER — Encounter: Payer: Medicare Other | Admitting: *Deleted

## 2023-05-12 DIAGNOSIS — J449 Chronic obstructive pulmonary disease, unspecified: Secondary | ICD-10-CM | POA: Diagnosis not present

## 2023-05-12 NOTE — Progress Notes (Signed)
Daily Session Note  Patient Details  Name: Leslie Sims MRN: [id number] Date of Birth: Apr 30, 1951 Referring Provider:   Flowsheet Row Pulmonary Rehab from 03/28/2023 in [facility] Cardiac and Pulmonary Rehab  Referring Provider [name]       Encounter Date: 05/12/2023  Check In:  Session Check In - 05/12/23 1334       Check-In   Supervising physician immediately available to respond to emergencies See telemetry face sheet for immediately available ER MD    Location [facility]-Cardiac & Pulmonary Rehab    Staff Present Lanny Hurst, RN, ADN;Joseph Brownell, RCP,RRT,BSRT;Other   [place], MS   Virtual Visit No    Medication changes reported     No    Fall or balance concerns reported    No    Warm-up and Cool-down Performed on first and last piece of equipment    Resistance Training Performed Yes    VAD Patient? No    PAD/SET Patient? No      Pain Assessment   Currently in Pain? No/denies                Social History   Tobacco Use  Smoking Status Former   Packs/day: 3.00   Years: 42.00   Additional pack years: 0.00   Total pack years: 126.00   Types: Cigarettes   Quit date: 02/10/2019   Years since quitting: 4.2  Smokeless Tobacco Never  Tobacco Comments   25 months ago most smoked 4 packs a day .    Goals Met:  Independence with exercise equipment Exercise tolerated well No report of concerns or symptoms today Strength training completed today  Goals Unmet:  Not Applicable  Comments: Pt able to follow exercise prescription today without complaint.  Will continue to monitor for progression.    Dr. Bethann Punches is Medical Director for [facility] Cardiac Rehabilitation.  Dr. Vida Rigger is Medical Director for [facility] Pulmonary Rehabilitation.

## 2023-05-17 ENCOUNTER — Encounter: Payer: Self-pay | Admitting: Emergency Medicine

## 2023-05-17 ENCOUNTER — Emergency Department: Payer: Medicare Other

## 2023-05-17 ENCOUNTER — Other Ambulatory Visit: Payer: Self-pay

## 2023-05-17 ENCOUNTER — Encounter: Payer: Medicare Other | Admitting: Cardiology

## 2023-05-17 ENCOUNTER — Emergency Department
Admission: EM | Admit: 2023-05-17 | Discharge: 2023-05-17 | Disposition: A | Discharge status: Home / Self Care | Payer: Medicare Other | Attending: Emergency Medicine | Admitting: Emergency Medicine

## 2023-05-17 DIAGNOSIS — I509 Heart failure, unspecified: Secondary | ICD-10-CM | POA: Insufficient documentation

## 2023-05-17 DIAGNOSIS — J441 Chronic obstructive pulmonary disease with (acute) exacerbation: Secondary | ICD-10-CM | POA: Insufficient documentation

## 2023-05-17 DIAGNOSIS — R059 Cough, unspecified: Secondary | ICD-10-CM | POA: Diagnosis present

## 2023-05-17 DIAGNOSIS — I110 Hypertensive heart disease with heart failure: Secondary | ICD-10-CM | POA: Insufficient documentation

## 2023-05-17 DIAGNOSIS — I2510 Atherosclerotic heart disease of native coronary artery without angina pectoris: Secondary | ICD-10-CM | POA: Diagnosis not present

## 2023-05-17 LAB — CBC
HCT: 42.4 % (ref 39.0–52.0)
Hemoglobin: 13.3 g/dL (ref 13.0–17.0)
MCH: 29.6 pg (ref 26.0–34.0)
MCHC: 31.4 g/dL (ref 30.0–36.0)
MCV: 94.4 fL (ref 80.0–100.0)
Platelets: 200 K/uL (ref 150–400)
RBC: 4.49 MIL/uL (ref 4.22–5.81)
RDW: 12.7 % (ref 11.5–15.5)
WBC: 7 K/uL (ref 4.0–10.5)
nRBC: 0 % (ref 0.0–0.2)

## 2023-05-17 LAB — BASIC METABOLIC PANEL
Anion gap: 9 (ref 5–15)
BUN: 18 mg/dL (ref 8–23)
CO2: 26 mmol/L (ref 22–32)
Calcium: 8.8 mg/dL — ABNORMAL LOW (ref 8.9–10.3)
Chloride: 102 mmol/L (ref 98–111)
Creatinine, Ser: 0.94 mg/dL (ref 0.61–1.24)
GFR, Estimated: >60 mL/min (ref >60)
Glucose, Bld: 131 mg/dL — ABNORMAL HIGH (ref 70–99)
Potassium: 4 mmol/L (ref 3.5–5.1)
Sodium: 137 mmol/L (ref 135–145)

## 2023-05-17 LAB — TROPONIN I (HIGH SENSITIVITY)
Troponin I (High Sensitivity): 11 ng/L
Troponin I (High Sensitivity): 10 ng/L (ref <18)

## 2023-05-17 MED ORDER — LEVOFLOXACIN 750 MG PO TABS: 750.0000 mg | ORAL_TABLET | Freq: Every day | ORAL | 0 refills | Status: AC

## 2023-05-17 MED ORDER — LEVOFLOXACIN 750 MG PO TABS
750.0000 mg | ORAL_TABLET | Freq: Once | ORAL | Status: AC
  Administered 2023-05-17: 750 mg via ORAL
  Filled 2023-05-17: qty 1

## 2023-05-17 MED ORDER — IPRATROPIUM-ALBUTEROL 0.5-2.5 (3) MG/3ML IN SOLN
6.0000 mL | Freq: Once | RESPIRATORY_TRACT | Status: AC
  Administered 2023-05-17: 6 mL via RESPIRATORY_TRACT
  Filled 2023-05-17: qty 3

## 2023-05-17 MED ORDER — DEXAMETHASONE 4 MG PO TABS
8.0000 mg | ORAL_TABLET | Freq: Once | ORAL | Status: AC
  Administered 2023-05-17: 8 mg via ORAL
  Filled 2023-05-17: qty 2

## 2023-05-17 MED ORDER — IPRATROPIUM-ALBUTEROL 0.5-2.5 (3) MG/3ML IN SOLN
RESPIRATORY_TRACT | Status: AC
  Filled 2023-05-17: qty 3

## 2023-05-17 NOTE — ED Notes (Signed)
See triage note  Presents with some SOB  states the sx's started about 2 weeks ago   Has been seentwice for same  Feels like he is not any better

## 2023-05-17 NOTE — Discharge Instructions (Addendum)
Use your albuterol inhaler 2 puffs every 4-6 hours as needed for shortness of breath.  Take levofloxacin antibiotic for full course for 5 days as prescribed.  Follow-up with your doctor this week for checkup.  If you experience any new, worsening or unexpected symptoms come back to the emergency department for recheck.

## 2023-05-17 NOTE — ED Provider Notes (Signed)
[facility] Provider Note    Event Date/Time   First MD Initiated Contact with Patient 05/17/23 1156     (approximate)   History   Shortness of Breath and Chest Pain   HPI  Leslie Sims is a 72 y.o. male   Past medical history of COPD on 3 L, CHF, CAD, GERD, hypertension hyperlipidemia, peptic ulcer, here with cough, exertional dyspnea for 1 week, got better with steroids and doxycycline last week but now slowly getting worse again.    No fevers chills, no chest pain but he does describe there is tightness across his chest.    Feels like COPD exacerbation.    Also history of heart failure but has weighed himself every day and has not been edematous or weight gain.  No orthopnea.  No known sick contacts.  Able to perform activities of daily living despite the symptoms.   External Medical Documents Reviewed: Emergency department visit dated last week where he was evaluated for likely COPD exacerbation and discharged with doxycycline      Physical Exam   Triage Vital Signs: ED Triage Vitals  Enc Vitals Group     BP 05/17/23 0946 113/68     Pulse Rate 05/17/23 0946 83     Resp 05/17/23 0946 (!) 21     Temp 05/17/23 0946 (!) 97.5 F (36.4 C)     Temp src --      SpO2 05/17/23 0946 96 %     Weight 05/17/23 0945 190 lb (86.2 kg)     Height 05/17/23 0945 5\' 10"  (1.778 m)     Head Circumference --      Peak Flow --      Pain Score 05/17/23 0944 1     Pain Loc --      Pain Edu? --      Excl. in GC? --     Most recent vital signs: Vitals:   05/17/23 0946 05/17/23 1350  BP: 113/68 118/70  Pulse: 83 80  Resp: (!) 21 18  Temp: (!) 97.5 F (36.4 C) 98 F (36.7 C)  SpO2: 96% 96%    General: Awake, no distress.  CV:  Good peripheral perfusion.  Resp:  Normal effort.  Abd:  No distention.  Other:  Awake alert comfortable speaking full sentences no respiratory distress, very scant wheezing to apices bilaterally but no focality no  fever.  Soft nontender abdomen appears euvolemic, no peripheral edema.  No rales on auscultation of the lungs.   ED Results / Procedures / Treatments   Labs (all labs ordered are listed, but only abnormal results are displayed) Labs Reviewed  BASIC METABOLIC PANEL - Abnormal; Notable for the following components:      Result Value   Glucose, Bld 131 (*)    Calcium 8.8 (*)    All other components within normal limits  CBC  TROPONIN I (HIGH SENSITIVITY)  TROPONIN I (HIGH SENSITIVITY)     I ordered and reviewed the above labs they are notable for flat troponin x 2 white blood cell count is normal.  EKG  ED ECG REPORT I, Pilar [name], the attending physician, personally viewed and interpreted this ECG.   Date: 05/17/2023  EKG Time: 0944  Rate: 88  Rhythm: sinus  Axis: nl  Intervals:none  ST&T Change: no stemi    RADIOLOGY I independently reviewed and interpreted chest x-ray and see no obvious consolidations or pneumothorax   PROCEDURES:  Critical Care performed: no  Procedures   MEDICATIONS ORDERED IN ED: Medications  ipratropium-albuterol (DUONEB) 0.5-2.5 (3) MG/3ML nebulizer solution 6 mL (6 mLs Nebulization Given 05/17/23 1311)  dexamethasone (DECADRON) tablet 8 mg (8 mg Oral Given 05/17/23 1311)  levofloxacin (LEVAQUIN) tablet 750 mg (750 mg Oral Given 05/17/23 1311)     IMPRESSION / MDM / ASSESSMENT AND PLAN / ED COURSE  I reviewed the triage vital signs and the nursing notes.                                Patient's presentation is most consistent with acute presentation with potential threat to life or bodily function.  Differential diagnosis includes, but is not limited to, COPD exacerbation, heart failure exacerbation, bacterial pneumonia, viral URI, ACS, PE   The patient is on the cardiac monitor to evaluate for evidence of arrhythmia and/or significant heart rate changes.  MDM: This is a patient who was responsive to antibiotics and COPD  treatment last week but has slowly gotten worse with a productive cough and some scant wheezing on auscultation.  Will give him DuoNebs in the emergency department which she responded very well to with shortness of breath greatly improving.  Will give him a one-time dose of Decadron rather than a course of prednisone burst due to his concerns of hyperglycemia and diabetes.  Will start him on a course of levofloxacin given his productive cough and potentially double sickening from recent URI, though there is no fever or white blood cell count elevation.  He understands to follow-up with his primary doctor/pulmonologist for recheck later this week and opts for at home treatment rather than hospitalization at this time which I think is reasonable given his stability in the emergency department no hypoxemia no respiratory distress.        FINAL CLINICAL IMPRESSION(S) / ED DIAGNOSES   Final diagnoses:  COPD exacerbation (HCC)     Rx / DC Orders   ED Discharge Orders          Ordered    levofloxacin (LEVAQUIN) 750 MG tablet  Daily        05/17/23 1306             Note:  This document was prepared using Dragon voice recognition software and may include unintentional dictation errors.    Pilar [name], MD 05/17/23 [phone]

## 2023-05-17 NOTE — ED Triage Notes (Signed)
Pt to ED for shob for past 2 weeks. States was here over weekened and put on antibiotic. Wears 3 L Barry at home. Reports chest discomfort for 1 week. Pt speaking in complete sentences.

## 2023-05-19 ENCOUNTER — Encounter: Payer: Medicare Other | Admitting: *Deleted

## 2023-05-23 ENCOUNTER — Encounter: Payer: Medicare Other | Attending: Pulmonary Disease | Admitting: *Deleted

## 2023-05-23 DIAGNOSIS — J449 Chronic obstructive pulmonary disease, unspecified: Secondary | ICD-10-CM | POA: Diagnosis present

## 2023-05-23 NOTE — Progress Notes (Signed)
Daily Session Note  Patient Details  Name: Leslie Sims MRN: [id number] Date of Birth: May 04, 1951 Referring Provider:   Flowsheet Row Pulmonary Rehab from 03/28/2023 in [facility] Cardiac and Pulmonary Rehab  Referring Provider [name]       Encounter Date: 05/23/2023  Check In:  Session Check In - 05/23/23 1346       Check-In   Supervising physician immediately available to respond to emergencies See telemetry face sheet for immediately available ER MD    Location [facility]-Cardiac & Pulmonary Rehab    Staff Present Cyndia Diver, RN, BSN, Cammie Sickle, Guinevere Ferrari, RN, ADN    Virtual Visit No    Medication changes reported     Yes    Comments Pt states that he has started a 5 day course of Levaquin 750 mg for URI    Fall or balance concerns reported    No    Tobacco Cessation No Change    Warm-up and Cool-down Performed on first and last piece of equipment    Resistance Training Performed Yes    VAD Patient? No    PAD/SET Patient? No      Pain Assessment   Currently in Pain? No/denies                Social History   Tobacco Use  Smoking Status Former   Packs/day: 3.00   Years: 42.00   Additional pack years: 0.00   Total pack years: 126.00   Types: Cigarettes   Quit date: 02/10/2019   Years since quitting: 4.2  Smokeless Tobacco Never  Tobacco Comments   25 months ago most smoked 4 packs a day .    Goals Met:  Independence with exercise equipment Exercise tolerated well No report of concerns or symptoms today  Goals Unmet:  Not Applicable  Comments: .exgoo   Dr. Bethann Punches is Medical Director for [facility] Cardiac Rehabilitation.  Dr. Vida Rigger is Medical Director for [facility] Pulmonary Rehabilitation.

## 2023-05-25 ENCOUNTER — Encounter: Payer: Medicare Other | Admitting: *Deleted

## 2023-05-25 DIAGNOSIS — J449 Chronic obstructive pulmonary disease, unspecified: Secondary | ICD-10-CM

## 2023-05-25 NOTE — Progress Notes (Signed)
Daily Session Note  Patient Details  Name: Leslie Sims MRN: [id number] Date of Birth: 1951/03/25 Referring Provider:   Flowsheet Row Pulmonary Rehab from 03/28/2023 in [facility] Cardiac and Pulmonary Rehab  Referring Provider [name]       Encounter Date: 05/25/2023  Check In:  Session Check In - 05/25/23 1352       Check-In   Supervising physician immediately available to respond to emergencies See telemetry face sheet for immediately available ER MD    Location [facility]-Cardiac & Pulmonary Rehab    Staff Present Cora Collum, RN, BSN, CCRP;Jessica Burnside, MA, RCEP, CCRP, CCET;Noah Tickle, BS, Exercise Physiologist;Tramaine Snell Firefighter, BSN    Virtual Visit No    Medication changes reported     No    Fall or balance concerns reported    No    Tobacco Cessation No Change    Warm-up and Cool-down Performed on first and last piece of equipment    Resistance Training Performed Yes    VAD Patient? No    PAD/SET Patient? No      Pain Assessment   Currently in Pain? No/denies                Social History   Tobacco Use  Smoking Status Former   Packs/day: 3.00   Years: 42.00   Additional pack years: 0.00   Total pack years: 126.00   Types: Cigarettes   Quit date: 02/10/2019   Years since quitting: 4.2  Smokeless Tobacco Never  Tobacco Comments   25 months ago most smoked 4 packs a day .    Goals Met:  Independence with exercise equipment Exercise tolerated well No report of concerns or symptoms today Strength training completed today  Goals Unmet:  Not Applicable  Comments: Pt able to follow exercise prescription today without complaint.  Will continue to monitor for progression.    Dr. Bethann Punches is Medical Director for [facility] Cardiac Rehabilitation.  Dr. Vida Rigger is Medical Director for [facility] Pulmonary Rehabilitation.

## 2023-05-26 ENCOUNTER — Encounter: Payer: Medicare Other | Admitting: *Deleted

## 2023-05-26 DIAGNOSIS — J449 Chronic obstructive pulmonary disease, unspecified: Secondary | ICD-10-CM

## 2023-05-26 NOTE — Progress Notes (Signed)
Daily Session Note  Patient Details  Name: Javarious Huard MRN: [id number] Date of Birth: September 12, 1951 Referring Provider:   Flowsheet Row Pulmonary Rehab from 03/28/2023 in [facility]  Referring Provider [name]       Encounter Date: 05/26/2023  Check In:  Session Check In - 05/26/23 1349       Check-In   Supervising physician immediately available to respond to emergencies See telemetry face sheet for immediately available ER MD    Location [facility]-Cardiac & Pulmonary Rehab    Staff Present Cyndia Diver, RN, BSN, Cammie Sickle, [place], [place], RCEP, CCRP, CCET    Virtual Visit No    Medication changes reported     No    Fall or balance concerns reported    No    Tobacco Cessation No Change    Warm-up and Cool-down Performed on first and last piece of equipment    Resistance Training Performed Yes    VAD Patient? No    PAD/SET Patient? No      Pain Assessment   Currently in Pain? No/denies                Social History   Tobacco Use  Smoking Status Former   Packs/day: 3.00   Years: 42.00   Additional pack years: 0.00   Total pack years: 126.00   Types: Cigarettes   Quit date: 02/10/2019   Years since quitting: 4.2  Smokeless Tobacco Never  Tobacco Comments   25 months ago most smoked 4 packs a day .    Goals Met:  Independence with exercise equipment Exercise tolerated well No report of concerns or symptoms today  Goals Unmet:  Not Applicable  Comments: Pt able to follow exercise prescription today without complaint.  Will continue to monitor for progression.    Dr. Bethann Punches is Medical Director for [facility] Cardiac Rehabilitation.  Dr. Vida Rigger is Medical Director for [facility] Pulmonary Rehabilitation.

## 2023-05-28 ENCOUNTER — Emergency Department: Payer: Medicare Other

## 2023-05-28 ENCOUNTER — Other Ambulatory Visit: Payer: Self-pay

## 2023-05-28 ENCOUNTER — Encounter: Payer: Self-pay | Admitting: Emergency Medicine

## 2023-05-28 ENCOUNTER — Emergency Department
Admission: EM | Admit: 2023-05-28 | Discharge: 2023-05-28 | Disposition: A | Discharge status: Home / Self Care | Payer: Medicare Other | Attending: Emergency Medicine | Admitting: Emergency Medicine

## 2023-05-28 DIAGNOSIS — J449 Chronic obstructive pulmonary disease, unspecified: Secondary | ICD-10-CM | POA: Insufficient documentation

## 2023-05-28 DIAGNOSIS — R0602 Shortness of breath: Secondary | ICD-10-CM

## 2023-05-28 LAB — BRAIN NATRIURETIC PEPTIDE: B Natriuretic Peptide: 41.7 pg/mL (ref 0.0–100.0)

## 2023-05-28 LAB — CBC WITH DIFFERENTIAL/PLATELET
Abs Immature Granulocytes: 0.02 10*3/uL (ref 0.00–0.07)
Basophils Absolute: 0 10*3/uL (ref 0.0–0.1)
Basophils Relative: 0 %
Eosinophils Absolute: 0.1 10*3/uL (ref 0.0–0.5)
Eosinophils Relative: 1 %
HCT: 41.6 % (ref 39.0–52.0)
Hemoglobin: 12.8 g/dL — ABNORMAL LOW (ref 13.0–17.0)
Immature Granulocytes: 0 %
Lymphocytes Relative: 7 %
Lymphs Abs: 0.5 10*3/uL — ABNORMAL LOW (ref 0.7–4.0)
MCH: 29.1 pg (ref 26.0–34.0)
MCHC: 30.8 g/dL (ref 30.0–36.0)
MCV: 94.5 fL (ref 80.0–100.0)
Monocytes Absolute: 1 10*3/uL (ref 0.1–1.0)
Monocytes Relative: 14 %
Neutro Abs: 5.3 10*3/uL (ref 1.7–7.7)
Neutrophils Relative %: 78 %
Platelets: 196 10*3/uL (ref 150–400)
RBC: 4.4 MIL/uL (ref 4.22–5.81)
RDW: 12.6 % (ref 11.5–15.5)
WBC: 6.8 10*3/uL (ref 4.0–10.5)
nRBC: 0 % (ref 0.0–0.2)

## 2023-05-28 LAB — COMPREHENSIVE METABOLIC PANEL
ALT: 31 U/L (ref 0–44)
AST: 28 U/L (ref 15–41)
Albumin: 3.8 g/dL (ref 3.5–5.0)
Alkaline Phosphatase: 51 U/L (ref 38–126)
Anion gap: 7 (ref 5–15)
BUN: 18 mg/dL (ref 8–23)
CO2: 29 mmol/L (ref 22–32)
Calcium: 9 mg/dL (ref 8.9–10.3)
Chloride: 101 mmol/L (ref 98–111)
Creatinine, Ser: 0.93 mg/dL (ref 0.61–1.24)
GFR, Estimated: >60 mL/min (ref >60)
Glucose, Bld: 91 mg/dL (ref 70–99)
Potassium: 3.9 mmol/L (ref 3.5–5.1)
Sodium: 137 mmol/L (ref 135–145)
Total Bilirubin: 0.6 mg/dL (ref 0.3–1.2)
Total Protein: 6.5 g/dL (ref 6.5–8.1)

## 2023-05-28 LAB — TROPONIN I (HIGH SENSITIVITY): Troponin I (High Sensitivity): 9 ng/L (ref <18)

## 2023-05-28 LAB — D-DIMER, QUANTITATIVE: D-Dimer, Quant: 0.28 ug/mL-FEU (ref 0.00–0.50)

## 2023-05-28 MED ORDER — IPRATROPIUM-ALBUTEROL 0.5-2.5 (3) MG/3ML IN SOLN
3.0000 mL | Freq: Once | RESPIRATORY_TRACT | Status: AC
  Administered 2023-05-28: 3 mL via RESPIRATORY_TRACT
  Filled 2023-05-28: qty 3

## 2023-05-28 MED ORDER — FUROSEMIDE 10 MG/ML IJ SOLN
20.0000 mg | Freq: Once | INTRAMUSCULAR | Status: AC
  Administered 2023-05-28: 20 mg via INTRAVENOUS
  Filled 2023-05-28: qty 4

## 2023-05-28 NOTE — ED Provider Notes (Signed)
[facility] Provider Note    Event Date/Time   First MD Initiated Contact with Patient 05/28/23 0945     (approximate)   History   Chest Pain and Shortness of Breath   HPI  Leslie Sims is a 72 y.o. male presenting to the ER for shortness of breath.  Patient reports this is an ongoing for the last several weeks, not significantly changed over the past few days.  He was actually seen by myself on 5/19.  Had another ER visit on 5/28.  Workup both of those times were overall reassuring and presentation was felt to be related to his underlying COPD.  Patient states that he thinks he had some mild improvement after receiving antibiotics, but actually reports that he has been having a slow progression of worsening difficulty breathing over several months.  He has seen his primary care doctor, but has not followed up with his pulmonologist yet.  He does take daily controlling medicines for his COPD and is on home oxygen.  No fevers.  Reports occasional chest tightness, not significantly changed over the past few days.  Patient is in cardiac rehab.  He reports that he does get winded after several minutes, but is able to complete this.  Progress notes from rehab note that exercise tolerated well without reported concerns from 6/3, 6/5, and 6/6.     Physical Exam   Triage Vital Signs: ED Triage Vitals  Enc Vitals Group     BP 05/28/23 0940 (!) 128/109     Pulse Rate 05/28/23 0940 92     Resp 05/28/23 0940 (!) 22     Temp 05/28/23 0940 98.2 F (36.8 C)     Temp Source 05/28/23 0940 Oral     SpO2 05/28/23 0940 97 %     Weight 05/28/23 0941 189 lb 9.5 oz (86 kg)     Height 05/28/23 0941 5\' 10"  (1.778 m)     Head Circumference --      Peak Flow --      Pain Score 05/28/23 0941 7     Pain Loc --      Pain Edu? --      Excl. in GC? --     Most recent vital signs: Vitals:   05/28/23 0945 05/28/23 1000  BP: 127/68 117/68  Pulse: (!) 124 63  Resp: 13 10   Temp:    SpO2: 91% 97%     General: Awake, interactive  CV:  Regular rate, good peripheral perfusion.  Resp:  Respirations unlabored, satting in the upper 90s on his home oxygen, good air movement, but prolonged expiratory phase with mild appreciable wheezing throughout  Abd:  Soft, nondistended.  Neuro:  Symmetric facial movement, fluid speech   ED Results / Procedures / Treatments   Labs (all labs ordered are listed, but only abnormal results are displayed) Labs Reviewed  CBC WITH DIFFERENTIAL/PLATELET - Abnormal; Notable for the following components:      Result Value   Hemoglobin 12.8 (*)    Lymphs Abs 0.5 (*)    All other components within normal limits  COMPREHENSIVE METABOLIC PANEL  BRAIN NATRIURETIC PEPTIDE  D-DIMER, QUANTITATIVE  TROPONIN I (HIGH SENSITIVITY)     EKG EKG independently reviewed interpreted by myself (ER attending) demonstrates:  EKG demonstrates normal sinus rhythm at a rate of 96, PR 168, QRS 112, QTc 478, no acute ST changes  RADIOLOGY Imaging independently reviewed and interpreted by myself demonstrates:  PROCEDURES:  Critical Care performed: No  Procedures   MEDICATIONS ORDERED IN ED: Medications  ipratropium-albuterol (DUONEB) 0.5-2.5 (3) MG/3ML nebulizer solution 3 mL (3 mLs Nebulization Given 05/28/23 1030)  furosemide (LASIX) injection 20 mg (20 mg Intravenous Given 05/28/23 1153)     IMPRESSION / MDM / ASSESSMENT AND PLAN / ED COURSE  I reviewed the triage vital signs and the nursing notes.  Differential diagnosis includes, but is not limited to, progressive COPD with or without mild flare, CHF exacerbation, ACS, PE  Patient's presentation is most consistent with acute presentation with potential threat to life or bodily function.  72 year old male presenting for the third time to the ER for intermittent chest pressure and shortness of breath without significant change in symptoms.  I suspect that presentation may be due to  slow progression of his underlying COPD.  He is continuing to participate in cardiac rehab without significant functional limitation.  I did discuss the importance of following up with his pulmonologist as I do think he is at the point of needing specialist care for further management.  However, with this being his third ER presentation we will obtain repeat labs including a D-dimer, x-Brion Sossamon.  With mild wheezing we will go ahead and give him a DuoNeb treatment as well.  Lab work with mild anemia, not significantly different from prior, otherwise reassuring including normal troponin and D-dimer.  Patient reevaluated.  He does feel improved.  I do not there is limited utility of another round of antibiotics or steroids after multiple rounds.  I did strongly recommend that he follow-up closely with his pulmonologist for further evaluation to see if this daily controlling medicines can be adjusted.  He is continuing with cardiac rehab.  Do think he stable for discharge home.  Strict return precautions provided.    FINAL CLINICAL IMPRESSION(S) / ED DIAGNOSES   Final diagnoses:  Shortness of breath  Chronic obstructive pulmonary disease, unspecified COPD type (HCC)     Rx / DC Orders   ED Discharge Orders     None        Note:  This document was prepared using Dragon voice recognition software and may include unintentional dictation errors.   Trinna Post, MD 05/28/23 [phone]

## 2023-05-28 NOTE — Discharge Instructions (Addendum)
You were seen in the ER today for your ongoing shortness of breath.  Your labs are reassuring.  Your x-Rigley Niess does look like you may have a small amount of fluid buildup in your lungs.  We did give you an extra dose of your diuretic.  Please return to taking your medications as prescribed.  As we discussed, I am concerned that you are having progressive COPD symptoms.  Please contact your pulmonologist to arrange close outpatient follow-up for further evaluation.  Return to the ER for new or worsening symptoms.

## 2023-05-28 NOTE — ED Triage Notes (Signed)
Pt to ER with c/o chest burning and SHOB for last several weeks.  States has been seen several times for the same with no improvement.

## 2023-05-30 ENCOUNTER — Encounter: Payer: Medicare Other | Admitting: *Deleted

## 2023-05-30 ENCOUNTER — Telehealth: Payer: Self-pay | Admitting: Pulmonary Disease

## 2023-05-30 DIAGNOSIS — J449 Chronic obstructive pulmonary disease, unspecified: Secondary | ICD-10-CM | POA: Diagnosis not present

## 2023-05-30 NOTE — Telephone Encounter (Signed)
Appt scheduled for tomorrow at 9:00am. The patient is aware.  Nothing further needed.

## 2023-05-30 NOTE — Telephone Encounter (Signed)
Patient called stating he has had a lot of breathing issues the last few weeks,he has been in the hospital 3 times and was told he needs to make an appt with his pulmonologist. Patient has an appt with Rhunette Croft, NP on 6/28, but patient needs to be seen sooner. 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

## 2023-05-30 NOTE — Progress Notes (Signed)
Daily Session Note  Patient Details  Name: Leslie Sims MRN: [id number] Date of Birth: May 27, 1951 Referring Provider:   Flowsheet Row Pulmonary Rehab from 03/28/2023 in [facility] Pulmonary Re[facility]  Referring Provider [name]       Encounter Date: 05/30/2023  Check In:  Session Check In - 05/30/23 1344       Check-In   Supervising physician immediately available to respond to emergencies See telemetry face sheet for immediately available ER MD    Location [facility] & Pulmonary Re[facility]    Staff Present Susann Givens, RN BSN;Joseph Campton, RCP,RRT,BSRT;Laureen Phoenix, [place], RRT, CPFT    Virtual Visit No    Medication changes reported     No    Fall or balance concerns reported    No    Warm-up and Cool-down Performed on first and last piece of equipment    Resistance Training Performed Yes    VAD Patient? No      Pain Assessment   Currently in Pain? No/denies                Social History   Tobacco Use  Smoking Status Former   Packs/day: 3.00   Years: 42.00   Additional pack years: 0.00   Total pack years: 126.00   Types: Cigarettes   Quit date: 02/10/2019   Years since quitting: 4.3  Smokeless Tobacco Never  Tobacco Comments   25 months ago most smoked 4 packs a day .    Goals Met:  Independence with exercise equipment Exercise tolerated well No report of concerns or symptoms today Strength training completed today  Goals Unmet:  Not Applicable  Comments: Pt able to follow exercise prescription today without complaint.  Will continue to monitor for progression.    Dr. Bethann Punches is Medical Director for [facility] Cardiac Rehabilitation.  Dr. Vida Rigger is Medical Director for [facility] Pulmonary Rehabilitation.

## 2023-05-31 ENCOUNTER — Ambulatory Visit (INDEPENDENT_AMBULATORY_CARE_PROVIDER_SITE_OTHER): Payer: Medicare Other | Admitting: Pulmonary Disease

## 2023-05-31 ENCOUNTER — Encounter: Payer: Self-pay | Admitting: Pulmonary Disease

## 2023-05-31 VITALS — BP 120/70 | HR 84 | Temp 97.6°F | Ht 70.0 in | Wt 197.0 lb

## 2023-05-31 DIAGNOSIS — J449 Chronic obstructive pulmonary disease, unspecified: Secondary | ICD-10-CM | POA: Diagnosis not present

## 2023-05-31 DIAGNOSIS — I5042 Chronic combined systolic (congestive) and diastolic (congestive) heart failure: Secondary | ICD-10-CM | POA: Diagnosis not present

## 2023-05-31 DIAGNOSIS — J9611 Chronic respiratory failure with hypoxia: Secondary | ICD-10-CM

## 2023-05-31 DIAGNOSIS — K224 Dyskinesia of esophagus: Secondary | ICD-10-CM | POA: Diagnosis not present

## 2023-05-31 DIAGNOSIS — K219 Gastro-esophageal reflux disease without esophagitis: Secondary | ICD-10-CM

## 2023-05-31 MED ORDER — PANTOPRAZOLE SODIUM 40 MG PO TBEC: 40.0000 mg | DELAYED_RELEASE_TABLET | Freq: Two times a day (BID) | ORAL | 3 refills | Status: DC

## 2023-05-31 NOTE — Progress Notes (Unsigned)
Patient seen in the office today and instructed on use of flutter valve.  Patient expressed understanding and demonstrated technique.  

## 2023-05-31 NOTE — Progress Notes (Unsigned)
Subjective:    Patient ID: Leslie Sims, male    DOB: [birth year], 72 y.o.   MRN: [id number] Patient Care Team: [name], MD as PCP - General (Family Medicine) Iran Ouch, MD as PCP - Cardiology (Cardiology) Iran Ouch, MD as Consulting Physician (Cardiology) Glory Buff, RN as Registered Nurse Salena Saner, MD as Consulting Physician (Pulmonary Disease) Carmina Miller, MD as Radiation Oncologist (Radiation Oncology) Benita Gutter, RN as Oncology Nurse Navigator Rickard Patience, MD as Consulting Physician (Oncology) Sherlean Foot, MD (Neurology) Rickard Patience, MD as Consulting Physician (Oncology)  Chief Complaint  Patient presents with   Follow-up    Seen in ED multiple times due to shortness of breath.   Patient ID: Leslie Sims, male    DOB: 08-17-1951, 72 y.o.   MRN: [id number] Requesting MD/Service: Nilda Simmer, MD Date of initial consultation: 08/10/16 by Dr. Sung Amabile Reason for consultation: COPD, smoker   PT PROFILE: 44 M, former smoker quit 02/10/2019 with severe emphysema seen by multiple pulmonologists in past ([place], [place], [place], [place]) referred for further eval and mgmt of COPD.  Previously seen by Dr. Sung Amabile.   DATA/EVENTS: 07/24/12 Office Spirometry: Severe obstruction (FEV1 31% pred) 08/31/13 Office Spirometry: very severe obstruction (FEV1 25% pred) 06/18/15 CT chest: Severe emphysema 03/03/17 Overnight oximetry: Lowest SpO2 72%. 95% of time SpO2 was below 90% 03/03/17 : 384 meters. Desat to 84% 10/04/17 CT chest: Two new irregularly marginated nodules, one in the RUL, and a second in the mid LUL worrisome for either synchronous lung ca or metastatic involvement of the lungs. The two small nodules described on the prior dictated report have resolved in the left superior segment and most likely were postinflammatory. Diffuse centrilobular emphysema. 10/13/17 PET scan: The new left upper lobe nodule is FDG avid consistent  with malignancy. Recommend tissue confirmation. The new nodule in the right upper lobe demonstrates low level uptake. While the level of uptake suggests the possibility of an inflammatory or infectious process, a low-grade malignancy is not excluded. Given the suspected malignancy on the left, tissue confirmation should be considered on the right despite the low level of uptake. No distant metastatic disease identified 10/27/17 ENB ([facility]): Transbronchial Fine Needle Aspirations 21G X7, Transbronchial Forceps Biopsy X6. ALVEOLATED LUNG TISSUE WITH HEMOSIDERIN LADEN MACROPHAGES.  NEGATIVE FOR ATYPIA AND MALIGNANCY 11/01/17 PFTs: FVC: 2.96 > 3.14 L (68 > 72 %pred), FEV1: 0.94 > 0.93 L (27 %pred), FEV1/FVC: 32%, TLC: 9.04 L (137 %pred), DLCO 62 %pred 12/07/17 CTA chest: No pulmonary embolus identified. Anterior left upper lobe ground-glass and nodular consolidation likely representing pneumonitis. Stable bilateral upper lobe pulmonary nodules. Stable emphysema. 01/21/18 CT chest: new masslike architectural distortion and ground-glass attenuation surrounding the index nodule within the RUL. The appearance favors an inflammatory or infectious process. The index nodule in the left upper lobe is slightly decreased in volume when compared with the previous exam favoring a benign process. Attention on follow-up imaging advise. Advanced changes of emphysema (very severe) 06/01/18 CTA chest: No evidence of pulmonary embolism. Waxing and waning bilateral pulmonary nodular process as described. New 8 mm nodular ill-defined focus of opacification over the lingula which may represent an acute inflammatory or infectious process. No effusion. Emphysema 10/19/18 CTA chest: No acute cardiopulmonary disease and no evidence of pulm embolism. Hospitalization 1/12-1/14/2020: COPD exacerbation Hospitalization 2/22-2/27/20: COPD exacerbation, HCAP 02/11/19 CT chest: Progressed masslike consolidation within the RUL in the region of  previous waxing and waning nodule, this is contiguous with  the pleural surface and demonstrates spiculated margin and probable small focus of central cavitation. Findings could be secondary to infection superimposed on chronic nodule. However cavitary neoplasm is also a concern. Multiple additional lung nodules which have not significantly changed. Small foci of ground-glass density in the RUL may reflect small focus of infection. Mod-severe emphysema 05/01/19 CT chest: Waxing/waning right lung opacities, suggesting infection, including atypical/mycobacterial infection. Improvement of the prior dominant masslike opacity argues against neoplasm. Stable left upper lobe nodule, likely benign. Aortic Atherosclerosis and Emphysema 11/02/2019 CT chest: Multiple larger pulmonary nodules and masslike areas seen on the prior study have largely regressed.  Findings indicative of infectious/inflammatory process.  Diffuse bronchial wall thickening with moderate centrilobular and paraseptal emphysema.  Suggestion of left main and three-vessel coronary artery disease. 04/23/2020 CT chest: "Multiple bilateral stable pulmonary nodules. New 7 mm nodule over the posterior left upper lobe. Stable nodularity over an area ofright upper lobe/apical scarring with new 9 mm nodular focus along the most inferior aspect of this scarring. This area has shown to wax and wane on previous exams and is most likely inflammatory". 07/24/2020 CT chest: "Background of marked pulmonary emphysema with stable post infectious or inflammatory changes and with near complete resolution of a presumably infectious or inflammatory nodule in the LEFT upper lobe as compared to previous exam". 01/21/2021 CT chest: Severe emphysematous changes, pulmonary scarring, bilateral calcified granulomas. 01/29/2021 PFTs: Further decline in lung function.  FEV1 0.74 L or 22% predicted, FVC 1.91 L or 42% predicted.  No significant bronchodilator response.  FEV1/FVC 39%.   Patient could not perform lung volumes. Diffusion capacity severely reduced.  This is consistent with very severe COPD. 02/13/2021 2D echo: LVEF 40 to 45% global hypokinesis, severe hypokinesis of the periapical region.  Grade II diastolic dysfunction, dilated left atrium. 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  Biopsies high-grade dysplastic. 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 05/09/2022 PET/CT: Intense tracer uptake associated with the distal esophageal mass. 06/03/2022 chemotherapy-XRT: Patient began concurrent carboplatin/Taxol/XRT. 07/26/2022 completed XRT: Completing chemotherapy. 09/02/2022 PET/CT: Distal esophageal wall thickening with significant reduction in abnormal FDG activity, small focus of minimally metabolic activity, possibly reflecting posttreatment change.  No evidence of hypermetabolic metastatic disease. 09/09/2022 echocardiogram: LVEF 50 to 55%, grade 1 DD, normal RV function, 10/11/2022 chest x-ray: Hyperinflation of the lungs no acute cardiopulmonary disease.  Granuloma of the LEFT upper lobe. 12/06/2022 PET/CT: No evidence of local recurrence or metastatic disease after treatment for esophageal cancer.  Stable bilateral adrenal adenomas. 01/16/2023 chest x-ray: Changes consistent with COPD with no active disease. 01/26/2023 barium swallow: Moderate esophageal dysmotility, markedly delayed passage of barium tablet at the level of the GE junction passing after approximately 20 minutes. 04/04/2023 right and left heart cath: LVEF 25 to 35%, moderately elevated LV end-diastolic pressure, moderate to severe left ventricular systolic dysfunction, moderate to severely reduced LV systolic function with akinesis of mid to distal anterior and apical myocardium.  Right heart catheterization showed normal RA pressure, mild pulmonary hypertension mild elevated wedge  pressure and normal cardiac output.  Patient referred to advanced heart failure clinic. 05/02/2023 echocardiogram: LVEF 35 to 40%, moderate decreased function on the LV, LV with regional wall motion abnormalities.  LV internal cavity dilated.  Grade 1 DD severe hypokinesis of the left ventricular, mid apical anterior wall, anterolateral wall and apical segment.  LA mildly dilated.  No valvular abnormalities.   INTERVAL: Last seen by  me on 19 January 2023.  At that time he had had ED visit the days prior for burning chest pain.  Lansoprazole started empirically for GERD and barium swallow ordered.  Since his last visit here chest pain has subsided.  HPI    Review of Systems     Current Meds  Medication Sig   albuterol (VENTOLIN HFA) 108 (90 Base) MCG/ACT inhaler Inhale 2 puffs into the lungs every 6 (six) hours as needed for wheezing or shortness of breath.   ALPRAZolam (XANAX) 0.5 MG tablet Take 0.5 mg by mouth 3 (three) times daily.   aspirin 81 MG tablet Take 81 mg by mouth daily.   atorvastatin (LIPITOR) 80 MG tablet Take 1 tablet (80 mg total) by mouth daily.   budesonide (PULMICORT) 0.5 MG/2ML nebulizer solution Take 2 mLs (0.5 mg total) by nebulization in the morning and at bedtime.   dapagliflozin propanediol (FARXIGA) 10 MG TABS tablet Take 1 tablet (10 mg total) by mouth daily before breakfast.   fluticasone (FLONASE) 50 MCG/ACT nasal spray Place 2 sprays into both nostrils daily.   furosemide (LASIX) 20 MG tablet Take 20 mg by mouth daily.   ipratropium-albuterol (DUONEB) 0.5-2.5 (3) MG/3ML SOLN Take 3 mLs by nebulization 2 (two) times daily.   ipratropium-albuterol (DUONEB) 0.5-2.5 (3) MG/3ML SOLN Take 3 mLs by nebulization every 6 (six) hours as needed.   isosorbide mononitrate (IMDUR) 30 MG 24 hr tablet Take 1 tablet (30 mg total) by mouth daily.   ketoconazole (NIZORAL) 2 % cream Apply topically 2 (two) times daily.   lansoprazole (PREVACID) 30 MG capsule Take 1 capsule  (30 mg total) by mouth 2 (two) times daily before a meal.   loratadine (CLARITIN) 10 MG tablet Take 10 mg by mouth daily.   losartan (COZAAR) 25 MG tablet Take 1 tablet (25 mg total) by mouth daily.   mirtazapine (REMERON) 45 MG tablet Take 45 mg by mouth at bedtime.   Multiple Vitamin (MULTIVITAMIN) capsule Take 1 capsule by mouth daily.   nitroGLYCERIN (NITROSTAT) 0.4 MG SL tablet Place 1 tablet (0.4 mg total) under the tongue every 5 (five) minutes as needed.   OXYGEN Inhale 3 L into the lungs daily.   Spacer/Aero-Holding Chambers (AEROCHAMBER MV) inhaler Use as instructed   spironolactone (ALDACTONE) 25 MG tablet Take 0.5 tablets (12.5 mg total) by mouth daily.       Objective:   Physical Exam BP 120/70 (BP Location: Left Arm, Patient Position: Sitting, Cuff Size: Normal)   Pulse 84   Temp 97.6 F (36.4 C) (Oral)   Ht 5\' 10"  (1.778 m)   Wt 197 lb (89.4 kg)   SpO2 99%   BMI 28.27 kg/m   SpO2: 99 % O2 Device: Nasal cannula O2 Flow Rate (L/min): 4 L/min O2 Type: Pulse O2        Assessment & Plan:

## 2023-05-31 NOTE — Patient Instructions (Addendum)
I have changed the medication for your reflux.  This is a medication called Protonix (pantoprazole) this will be twice a day.  I have provided you with a valve call and Acapella that you will use after your nebulizer treatments to help break up mucus.  I think your shortness of breath has to do with your recent decline in heart function.  The burning in your chest has to do with the fact that your esophagus is not working like it should previously prior to your cancer treatment and acid gets up in your chest.  We will put a hold on your pulmonary rehab until you are get your heart issues under control.  Will see him in follow-up in 4 to 6 weeks time call sooner should any new problems arise.

## 2023-06-01 ENCOUNTER — Encounter: Payer: Self-pay | Admitting: *Deleted

## 2023-06-01 DIAGNOSIS — J449 Chronic obstructive pulmonary disease, unspecified: Secondary | ICD-10-CM

## 2023-06-01 NOTE — Progress Notes (Signed)
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)  Patient's Home Medications on Admission:  Current Outpatient Medications:    albuterol (VENTOLIN HFA) 108 (90 Base) MCG/ACT inhaler, Inhale 2 puffs into the lungs every 6 (six) hours as needed for wheezing or shortness of breath., Disp: 8 g, Rfl: 2   ALPRAZolam (XANAX) 0.5 MG tablet, Take 0.5 mg by mouth 3 (three) times daily., Disp: , Rfl:    aspirin 81 MG tablet, Take 81 mg by mouth daily., Disp: , Rfl:    atorvastatin (LIPITOR) 80 MG tablet, Take 1 tablet (80 mg total) by mouth daily., Disp: 90 tablet, Rfl: 3   budesonide (PULMICORT) 0.5 MG/2ML nebulizer solution, Take 2 mLs (0.5 mg total) by nebulization in the morning and at bedtime., Disp: 320 mL, Rfl: 12   dapagliflozin propanediol (FARXIGA) 10 MG TABS tablet, Take 1 tablet (10 mg total) by mouth daily before breakfast., Disp: 30 tablet, Rfl: 6   fluticasone (FLONASE) 50 MCG/ACT nasal spray, Place 2 sprays into both nostrils daily., Disp: , Rfl:    furosemide (LASIX) 20 MG tablet, Take 20 mg by mouth daily., Disp: , Rfl:    ipratropium-albuterol (DUONEB) 0.5-2.5 (3) MG/3ML SOLN, Take 3 mLs by nebulization 2 (two) times daily., Disp: , Rfl:    ipratropium-albuterol (DUONEB) 0.5-2.5 (3) MG/3ML SOLN, Take 3 mLs by nebulization every 6 (six) hours as needed., Disp: 360 mL, Rfl: 11   isosorbide mononitrate (IMDUR) 30 MG 24 hr tablet, Take 1 tablet (30 mg total) by mouth daily., Disp: 90 tablet, Rfl: 3   ketoconazole (NIZORAL) 2 % cream, Apply topically 2 (two) times daily., Disp: ,  Rfl:    loratadine (CLARITIN) 10 MG tablet, Take 10 mg by mouth daily., Disp: , Rfl:    losartan (COZAAR) 25 MG tablet, Take 1 tablet (25 mg total) by mouth daily., Disp: 90 tablet, Rfl: 3   mirtazapine (REMERON) 45 MG tablet, Take 45 mg by mouth at bedtime., Disp: , Rfl:    Multiple Vitamin (MULTIVITAMIN) capsule, Take 1 capsule by mouth daily., Disp: , Rfl:    nitroGLYCERIN (NITROSTAT) 0.4 MG SL tablet, Place 1 tablet (0.4 mg total) under the tongue every 5 (five) minutes as needed., Disp: 25 tablet, Rfl: 3   OXYGEN, Inhale 3 L into the lungs daily., Disp: , Rfl:    pantoprazole (PROTONIX) 40 MG tablet, Take 1 tablet (40 mg total) by mouth 2 (two) times daily before a meal., Disp: 60 tablet, Rfl: 3   Spacer/Aero-Holding Chambers (AEROCHAMBER MV) inhaler, Use as instructed, Disp: 1 each, Rfl: 2   spironolactone (ALDACTONE) 25 MG tablet, Take 0.5 tablets (12.5 mg total) by mouth daily., Disp: 30 tablet, Rfl: 5  Past Medical History: Past Medical History:  Diagnosis Date   Allergic rhinitis    Anxiety    Back pain    Cancer (HCC)    prostate   CHF (congestive heart failure) (HCC)    Colon polyps 08/2012   colonoscopy; multiple colon polyps; repeat colonoscopy in one year.   Complication of anesthesia    COPD (chronic obstructive  pulmonary disease) (HCC)    a. on home O2.   Coronary artery disease 11/2009   a. late presenting ant MI; b. LHC 100% pLAD s/p PCI/DES, 99% mRCA s/p PCI/DES, EF 35%; c. nuclear stress test 05/13: prior ant/inf infarcts w/o ischemia, EF 43%; d. LHC 02/14: widely patent stents with no other obs dz, EF 40%; e. LHC 11/17: LM nl, mLAD 10%, patent LAD stent, dLAD 20%, p-mRCA 10%, mRCA 40%, patent RCA stent, EF 35-45%    Depression    Diabetes (HCC)    Dysphagia    Family history of adverse reaction to anesthesia    GERD (gastroesophageal reflux disease)    Headache(784.0)    Helicobacter pylori (H. pylori)    Hemorrhoids    Hernia    inguinal   HFrEF (heart  failure with reduced ejection fraction) (HCC)    a. 10/2016 LV gram: EF 35-45%; b. 06/2018 Echo: EF 40-45%. c. 01/2021 Echo: EF 40-45%   Hyperlipidemia    Hypertension    Insomnia    Ischemic cardiomyopathy    a. 10/2016 LV gram: EF 35-45%; b. 06/2018 Echo: EF 40-45%.   Myalgia    Oxygen dependent    Peptic ulcer    Pulmonary nodules    ST elevation (STEMI) myocardial infarction involving left anterior descending coronary artery (HCC) 11/2009   a. s/p PCI to the LAD   Status post dilation of esophageal narrowing 2000   Thickening of esophagus    Tinea pedis    Wears dentures    partial upper    Tobacco Use: Social History   Tobacco Use  Smoking Status Former   Packs/day: 3.00   Years: 42.00   Additional pack years: 0.00   Total pack years: 126.00   Types: Cigarettes   Quit date: 02/10/2019   Years since quitting: 4.3  Smokeless Tobacco Never  Tobacco Comments   25 months ago most smoked 4 packs a day .    Labs: Review Flowsheet  More data exists      Latest Ref Rng & Units 01/30/2019 04/10/2020 03/18/2021 01/26/2023 04/04/2023  Labs for ITP Cardiac and Pulmonary Rehab  Cholestrol 0 - 200 mg/dL - - 161  096  -  LDL (calc) 0 - 99 mg/dL - - 81  47  -  HDL-C >04 mg/dL - - 69  87  -  Trlycerides <150 mg/dL - - 77  540  -  Hemoglobin A1c 4.8 - 5.6 % 6.2  - - - -  PH, Arterial 7.35 - 7.45 - - - - 7.317   PCO2 arterial 32 - 48 mmHg - - - - 66.4   Bicarbonate 20.0 - 28.0 mmol/L - 31.3  - - 34.0  29.2   TCO2 22 - 32 mmol/L - - - - 36  31   O2 Saturation % - 92.4  - - 96  70      Pulmonary Assessment Scores:  Pulmonary Assessment Scores     Row Name 03/28/23 1702         ADL UCSD   ADL Phase Entry     SOB Score total 77     Rest 1     Walk 3     Stairs 4     Bath 4     Dress 3     Shop 4       CAT Score   CAT Score 21       mMRC Score   mMRC Score 3  UCSD: Self-administered rating of dyspnea associated with activities of daily living  (ADLs) 6-point scale (0 = "not at all" to 5 = "maximal or unable to do because of breathlessness")  Scoring Scores range from 0 to 120.  Minimally important difference is 5 units  CAT: CAT can identify the health impairment of COPD patients and is better correlated with disease progression.  CAT has a scoring range of zero to 40. The CAT score is classified into four groups of low (less than 10), medium (10 - 20), high (21-30) and very high (31-40) based on the impact level of disease on health status. A CAT score over 10 suggests significant symptoms.  A worsening CAT score could be explained by an exacerbation, poor medication adherence, poor inhaler technique, or progression of COPD or comorbid conditions.  CAT MCID is 2 points  mMRC: mMRC (Modified Medical Research Council) Dyspnea Scale is used to assess the degree of baseline functional disability in patients of respiratory disease due to dyspnea. No minimal important difference is established. A decrease in score of 1 point or greater is considered a positive change.   Pulmonary Function Assessment:  Pulmonary Function Assessment - 03/28/23 1651       Pulmonary Function Tests   FVC% 40 %    FEV1% 20 %   post bronchodilator   FEV1/FVC Ratio 37             Exercise Target Goals: Exercise Program Goal: Individual exercise prescription set using results from initial 6 min walk test and THRR while considering  patient's activity barriers and safety.   Exercise Prescription Goal: Initial exercise prescription builds to 30-45 minutes a day of aerobic activity, 2-3 days per week.  Home exercise guidelines will be given to patient during program as part of exercise prescription that the participant will acknowledge.  Education: Aerobic Exercise: - Group verbal and visual presentation on the components of exercise prescription. Introduces F.I.T.T principle from ACSM for exercise prescriptions.  Reviews F.I.T.T. principles of aerobic  exercise including progression. Written material given at graduation.   Education: Resistance Exercise: - Group verbal and visual presentation on the components of exercise prescription. Introduces F.I.T.T principle from ACSM for exercise prescriptions  Reviews F.I.T.T. principles of resistance exercise including progression. Written material given at graduation.    Education: Exercise & Equipment Safety: - Individual verbal instruction and demonstration of equipment use and safety with use of the equipment. Flowsheet Row Pulmonary Rehab from 05/04/2023 in [facility] Cardiac and Pulmonary Rehab  Date 03/28/23  Educator [facility]  Instruction Review Code 1- Verbalizes Understanding       Education: Exercise Physiology & General Exercise Guidelines: - Group verbal and written instruction with models to review the exercise physiology of the cardiovascular system and associated critical values. Provides general exercise guidelines with specific guidelines to those with heart or lung disease.  Flowsheet Row Pulmonary Rehab from 05/04/2023 in [facility] Cardiac and Pulmonary Rehab  Education need identified 03/28/23       Education: Flexibility, Balance, Mind/Body Relaxation: - Group verbal and visual presentation with interactive activity on the components of exercise prescription. Introduces F.I.T.T principle from ACSM for exercise prescriptions. Reviews F.I.T.T. principles of flexibility and balance exercise training including progression. Also discusses the mind body connection.  Reviews various relaxation techniques to help reduce and manage stress (i.e. Deep breathing, progressive muscle relaxation, and visualization). Balance handout provided to take home. Written material given at graduation.   Activity Barriers & Risk Stratification:  Activity Barriers &  Cardiac Risk Stratification - 03/28/23 1647       Activity Barriers & Cardiac Risk Stratification   Activity Barriers Shortness of Breath              6 Minute Walk:  6 Minute Walk     Row Name 03/28/23 1644         6 Minute Walk   Phase Initial     Distance 765 feet     Walk Time 4.75 minutes     # of Rest Breaks 2     MPH 1.83     METS 1.9     RPE 13     Perceived Dyspnea  3     VO2 Peak 6.64     Symptoms No     Resting HR 87 bpm     Resting BP 122/60     Resting Oxygen Saturation  94 %     Exercise Oxygen Saturation  during 6 min walk 80 %     Max Ex. HR 116 bpm     Max Ex. BP 148/64     2 Minute Post BP 126/80       Interval HR   1 Minute HR 104     2 Minute HR 101     3 Minute HR 105     4 Minute HR 103     5 Minute HR 112     6 Minute HR 116     2 Minute Post HR 93     Interval Heart Rate? Yes       Interval Oxygen   Interval Oxygen? Yes     Baseline Oxygen Saturation % 94 %     1 Minute Oxygen Saturation % 90 %     1 Minute Liters of Oxygen 3 L     2 Minute Oxygen Saturation % 87 %     2 Minute Liters of Oxygen 3 L     3 Minute Oxygen Saturation % 81 %     3 Minute Liters of Oxygen 3 L     4 Minute Oxygen Saturation % 83 %     4 Minute Liters of Oxygen 3 L     5 Minute Oxygen Saturation % 80 %     5 Minute Liters of Oxygen 3 L     6 Minute Oxygen Saturation % 81 %     6 Minute Liters of Oxygen 3 L     2 Minute Post Oxygen Saturation % 89 %     2 Minute Post Liters of Oxygen 3 L             Oxygen Initial Assessment:  Oxygen Initial Assessment - 03/28/23 1700       Home Oxygen   Home Oxygen Device Home Concentrator;Portable Concentrator    Sleep Oxygen Prescription Continuous    Liters per minute 3    Home Exercise Oxygen Prescription Continuous    Liters per minute 3    Home Resting Oxygen Prescription Continuous    Liters per minute 3    Compliance with Home Oxygen Use Yes      Initial 6 min Walk   Oxygen Used Continuous;E-Tanks    Liters per minute 3      Program Oxygen Prescription   Program Oxygen Prescription Continuous;E-Tanks    Liters per minute 3       Intervention   Short Term Goals To learn and exhibit compliance with exercise, home and  travel O2 prescription;To learn and understand importance of monitoring SPO2 with pulse oximeter and demonstrate accurate use of the pulse oximeter.;To learn and understand importance of maintaining oxygen saturations>88%;To learn and demonstrate proper pursed lip breathing techniques or other breathing techniques. ;To learn and demonstrate proper use of respiratory medications    Long  Term Goals Exhibits compliance with exercise, home  and travel O2 prescription;Verbalizes importance of monitoring SPO2 with pulse oximeter and return demonstration;Maintenance of O2 saturations>88%;Exhibits proper breathing techniques, such as pursed lip breathing or other method taught during program session;Compliance with respiratory medication;Demonstrates proper use of MDI's             Oxygen Re-Evaluation:  Oxygen Re-Evaluation     Row Name 04/13/23 1352 04/25/23 1346 05/23/23 1353         Program Oxygen Prescription   Program Oxygen Prescription -- Continuous;E-Tanks Continuous;E-Tanks     Liters per minute -- 3  4 liters if needed 4       Home Oxygen   Home Oxygen Device -- Home Concentrator;Portable Concentrator Home Concentrator;Portable Concentrator     Sleep Oxygen Prescription -- Continuous Continuous     Liters per minute -- 3 3     Home Exercise Oxygen Prescription -- Continuous Continuous     Liters per minute -- 3 3     Home Resting Oxygen Prescription -- Continuous Continuous     Liters per minute -- 3 3     Compliance with Home Oxygen Use -- Yes Yes       Goals/Expected Outcomes   Short Term Goals -- To learn and demonstrate proper pursed lip breathing techniques or other breathing techniques.  To learn and understand importance of maintaining oxygen saturations>88%;To learn and understand importance of monitoring SPO2 with pulse oximeter and demonstrate accurate use of the pulse oximeter.      Long  Term Goals -- Exhibits proper breathing techniques, such as pursed lip breathing or other method taught during program session Verbalizes importance of monitoring SPO2 with pulse oximeter and return demonstration;Maintenance of O2 saturations>88%     Comments Reviewed PLB technique with pt.  Talked about how it works and it's importance in maintaining their exercise saturations. Informed patient how to perform the Pursed Lipped breathing technique. Told patient to Inhale through the nose and out the mouth with pursed lips to keep their airways open, help oxygenate them better, practice when at rest or doing strenuous activity. Patient Verbalizes understanding of technique and will work on and be reiterated during [facility]. Patient has a pulse oximeter to check oxygen saturation at home. Informed and explained to patient why it is important to check oxygen at rest and on exertion at home. Informed patient that they should be 88 percent and above with their oxygen readings and that it is important to have levels above 88 to prevent damage to tissues in the body. Patient verbalizes understanding.     Goals/Expected Outcomes Short: Become more profiecient at using PLB.   Long: Become independent at using PLB. Short: use PLB with exertion. Long: use PLB on exertion proficiently and independently. Short: monitor oxygen at rest and with exertion at home. Long: maintain oxygen saturations above 88 percent independently.              Oxygen Discharge (Final Oxygen Re-Evaluation):  Oxygen Re-Evaluation - 05/23/23 1353       Program Oxygen Prescription   Program Oxygen Prescription Continuous;E-Tanks    Liters per minute 4  Home Oxygen   Home Oxygen Device Home Concentrator;Portable Concentrator    Sleep Oxygen Prescription Continuous    Liters per minute 3    Home Exercise Oxygen Prescription Continuous    Liters per minute 3    Home Resting Oxygen Prescription Continuous    Liters per  minute 3    Compliance with Home Oxygen Use Yes      Goals/Expected Outcomes   Short Term Goals To learn and understand importance of maintaining oxygen saturations>88%;To learn and understand importance of monitoring SPO2 with pulse oximeter and demonstrate accurate use of the pulse oximeter.    Long  Term Goals Verbalizes importance of monitoring SPO2 with pulse oximeter and return demonstration;Maintenance of O2 saturations>88%    Comments Patient has a pulse oximeter to check oxygen saturation at home. Informed and explained to patient why it is important to check oxygen at rest and on exertion at home. Informed patient that they should be 88 percent and above with their oxygen readings and that it is important to have levels above 88 to prevent damage to tissues in the body. Patient verbalizes understanding.    Goals/Expected Outcomes Short: monitor oxygen at rest and with exertion at home. Long: maintain oxygen saturations above 88 percent independently.             Initial Exercise Prescription:  Initial Exercise Prescription - 03/28/23 1600       Date of Initial Exercise RX and Referring Provider   Date 03/28/23    Referring Provider [name]      Oxygen   Oxygen Continuous    Liters 3    Maintain Oxygen Saturation 88% or higher      Treadmill   MPH 11.4    Grade 0    Minutes 15    METs 2.02      Recumbant Bike   Level 1    RPM 50    Watts 15    Minutes 15      NuStep   Level 1    SPM 80    Minutes 15    METs 1.9      Arm Ergometer   Level 1    RPM 30    Minutes 15    METs 1.9      REL-XR   Level 1    Speed 50    Minutes 15    METs 1.9      Track   Laps 20    Minutes 15    METs 2.09      Prescription Details   Frequency (times per week) 3    Duration Progress to 30 minutes of continuous aerobic without signs/symptoms of physical distress      Intensity   THRR 40-80% of Max Heartrate 112-130    Ratings of Perceived Exertion 11-13     Perceived Dyspnea 0-4      Progression   Progression Continue to progress workloads to maintain intensity without signs/symptoms of physical distress.      Resistance Training   Training Prescription Yes    Weight 5    Reps 10-15             Perform Capillary Blood Glucose checks as needed.  Exercise Prescription Changes:   Exercise Prescription Changes     Row Name 03/28/23 1600 04/14/23 1600 04/25/23 1400 05/09/23 1400 05/24/23 1500     Response to Exercise   Blood Pressure (Admit) 122/60 134/66 140/68 128/70 104/60   Blood Pressure (  Exercise) 148/64 162/70 178/82 134/60 --   Blood Pressure (Exit) 126/80 134/68 112/64 126/72 98/58   Heart Rate (Admit) 87 bpm 99 bpm 88 bpm 99 bpm 103 bpm   Heart Rate (Exercise) 116 bpm 127 bpm 132 bpm 111 bpm 116 bpm   Heart Rate (Exit) 93 bpm 80 bpm 114 bpm 105 bpm 107 bpm   Oxygen Saturation (Admit) 94 % 93 % 94 % 94 % 93 %   Oxygen Saturation (Exercise) 80 % 86 % 88 % 88 % 91 %   Oxygen Saturation (Exit) 95 % 93 % 93 % 94 % 95 %   Rating of Perceived Exertion (Exercise) 13 12 13 13 12    Perceived Dyspnea (Exercise) 3 2 3 3 3    Symptoms none none none SOB SOB   Comments 6 MWT resutls First two sessions of rehab -- -- --   Duration -- Progress to 30 minutes of  aerobic without signs/symptoms of physical distress Progress to 30 minutes of  aerobic without signs/symptoms of physical distress Continue with 30 min of aerobic exercise without signs/symptoms of physical distress. Continue with 30 min of aerobic exercise without signs/symptoms of physical distress.   Intensity -- THRR unchanged THRR unchanged THRR unchanged THRR unchanged     Progression   Progression -- Continue to progress workloads to maintain intensity without signs/symptoms of physical distress. Continue to progress workloads to maintain intensity without signs/symptoms of physical distress. Continue to progress workloads to maintain intensity without signs/symptoms of  physical distress. Continue to progress workloads to maintain intensity without signs/symptoms of physical distress.   Average METs -- 3.5 2.94 2.68 2.27     Resistance Training   Training Prescription -- Yes Yes Yes Yes   Weight -- 5 lb 5 lb 5 lb 5 lb   Reps -- 10-15 10-15 10-15 10-15     Interval Training   Interval Training -- No No No No     Oxygen   Oxygen -- Continuous Continuous Continuous Continuous   Liters -- 3 3 3-4 4     Treadmill   MPH -- -- 1.4 1.4 --   Grade -- -- 0 0 --   Minutes -- -- 15 15 --   METs -- -- 2.07 2.07 --     Recumbant Bike   Level -- 3 3 3  2.5   Watts -- 15 15 26 15    Minutes -- 15 15 15 15    METs -- -- -- 2.54 2.53     NuStep   Level -- 4 4 3 3    Minutes -- 30 15 15 15    METs -- 3.4 3.5 3.5 --     REL-XR   Level -- -- -- -- 1   Minutes -- -- -- -- 15   METs -- -- -- -- 2     T5 Nustep   Level -- -- -- 4 --   Minutes -- -- -- 15 --     Oxygen   Maintain Oxygen Saturation -- 88% or higher 88% or higher 88% or higher 88% or higher            Exercise Comments:   Exercise Comments     Row Name 04/13/23 1350           Exercise Comments First full day of exercise!  Patient was oriented to gym and equipment including functions, settings, policies, and procedures.  Patient's individual exercise prescription and treatment plan were reviewed.  All starting workloads  were established based on the results of the 6 minute walk test done at initial orientation visit.  The plan for exercise progression was also introduced and progression will be customized based on patient's performance and goals.                Exercise Goals and Review:   Exercise Goals     Row Name 03/28/23 1656             Exercise Goals   Increase Physical Activity Yes       Intervention Provide advice, education, support and counseling about physical activity/exercise needs.;Develop an individualized exercise prescription for aerobic and resistive  training based on initial evaluation findings, risk stratification, comorbidities and participant's personal goals.       Expected Outcomes Short Term: Attend rehab on a regular basis to increase amount of physical activity.;Long Term: Add in home exercise to make exercise part of routine and to increase amount of physical activity.;Long Term: Exercising regularly at least 3-5 days a week.       Increase Strength and Stamina Yes       Intervention Provide advice, education, support and counseling about physical activity/exercise needs.;Develop an individualized exercise prescription for aerobic and resistive training based on initial evaluation findings, risk stratification, comorbidities and participant's personal goals.       Expected Outcomes Short Term: Increase workloads from initial exercise prescription for resistance, speed, and METs.;Short Term: Perform resistance training exercises routinely during rehab and add in resistance training at home;Long Term: Improve cardiorespiratory fitness, muscular endurance and strength as measured by increased METs and functional capacity ( )       Able to understand and use rate of perceived exertion (RPE) scale Yes       Intervention Provide education and explanation on how to use RPE scale       Expected Outcomes Short Term: Able to use RPE daily in rehab to express subjective intensity level       Able to understand and use Dyspnea scale Yes       Intervention Provide education and explanation on how to use Dyspnea scale       Expected Outcomes Short Term: Able to use Dyspnea scale daily in rehab to express subjective sense of shortness of breath during exertion;Long Term: Able to use Dyspnea scale to guide intensity level when exercising independently       Knowledge and understanding of Target Heart Rate Range (THRR) Yes       Intervention Provide education and explanation of THRR including how the numbers were predicted and where they are located for  reference       Expected Outcomes Short Term: Able to state/look up THRR;Long Term: Able to use THRR to govern intensity when exercising independently;Short Term: Able to use daily as guideline for intensity in rehab       Able to check pulse independently Yes       Intervention Provide education and demonstration on how to check pulse in carotid and radial arteries.;Review the importance of being able to check your own pulse for safety during independent exercise       Expected Outcomes Short Term: Able to explain why pulse checking is important during independent exercise;Long Term: Able to check pulse independently and accurately       Understanding of Exercise Prescription Yes       Intervention Provide education, explanation, and written materials on patient's individual exercise prescription       Expected  Outcomes Short Term: Able to explain program exercise prescription;Long Term: Able to explain home exercise prescription to exercise independently                Exercise Goals Re-Evaluation :  Exercise Goals Re-Evaluation     Row Name 04/13/23 1351 04/14/23 1643 04/25/23 1441 05/09/23 1406 05/23/23 1354     Exercise Goal Re-Evaluation   Exercise Goals Review Increase Physical Activity;Able to understand and use rate of perceived exertion (RPE) scale;Knowledge and understanding of Target Heart Rate Range (THRR);Understanding of Exercise Prescription;Able to check pulse independently;Able to understand and use Dyspnea scale;Increase Strength and Stamina Increase Physical Activity;Understanding of Exercise Prescription;Increase Strength and Stamina Increase Physical Activity;Understanding of Exercise Prescription;Increase Strength and Stamina Increase Physical Activity;Understanding of Exercise Prescription;Increase Strength and Stamina Increase Physical Activity;Increase Strength and Stamina   Comments Reviewed RPE scale, THR and program prescription with pt today.  Pt voiced  understanding and was given a copy of goals to take home. Decarlos is off to a good start in rehab. He had an average MET level of 3.5 METs during his first two sessions. He also was able to work up to level 4 on the T4 nustep and level 3 on the recumbent bike. He has done well with 5 lb hand weights for resistance training as well. We will continue to monitor his progress. Bethann Berkshire is doing well in rehab. He recently began walking on the treadmill at a speed of 1.4 mph with no incline. He also has continued to work at level 4 on the T4 nustep and level 3 on the recumbent bike. He has done well with 5 lb hand weights for resistance training as well. We will continue to monitor his progress in the program. Dylen continues to do well in rehab. He increased his watts on the recumbent bike to 26! He has been consistent at a 1.4 mph speed on the treadmill and could benefit from adding a small incline to his workload. He uses between 3-4 L of O2 during exercise. We will continue to monitor. Rohit has been doing well in rehab.  He is doing nothing as of yet on their off days from rehab. He is feeling like their strength and stamina are at a standtill at the moment. He wants to get better and stronger but does not have the energy to do exercise at home yet.   Expected Outcomes Short: Use RPE daily to regulate intensity.  Long: Follow program prescription in THR. Short: Continue to follow current exercise prescription. Long: Continue to improve strength and stamina. Short: Continue to progressively increase treadmill workload. Long: Continue to improve strength and stamina. Short: Add small incline to treadmill Long: Continue to increase overall MET level and stamina Short: Continue to add in exercise at home on off days Long: Continue to improve strength and stamina    Row Name 05/24/23 1535             Exercise Goal Re-Evaluation   Exercise Goals Review Increase Physical Activity;Increase Strength and  Stamina;Understanding of Exercise Prescription       Comments Kadeem is doing well in the program. He has only attended rehab twice since the last review and has not done any walking in the program during that time. He has kept his workloads consistent on seated machines at level 1 on the XR, level 2.5 on the recumbent bike, and level 3 on the T4 nustep. He also continues to do well with 5 lb hand weights for  resistance training. We will continue to monitor his progress.       Expected Outcomes Short: Return to walking the treadmill in rehab. Long: Continue to increase overall average METs and stamina.                Discharge Exercise Prescription (Final Exercise Prescription Changes):  Exercise Prescription Changes - 05/24/23 1500       Response to Exercise   Blood Pressure (Admit) 104/60    Blood Pressure (Exit) 98/58    Heart Rate (Admit) 103 bpm    Heart Rate (Exercise) 116 bpm    Heart Rate (Exit) 107 bpm    Oxygen Saturation (Admit) 93 %    Oxygen Saturation (Exercise) 91 %    Oxygen Saturation (Exit) 95 %    Rating of Perceived Exertion (Exercise) 12    Perceived Dyspnea (Exercise) 3    Symptoms SOB    Duration Continue with 30 min of aerobic exercise without signs/symptoms of physical distress.    Intensity THRR unchanged      Progression   Progression Continue to progress workloads to maintain intensity without signs/symptoms of physical distress.    Average METs 2.27      Resistance Training   Training Prescription Yes    Weight 5 lb    Reps 10-15      Interval Training   Interval Training No      Oxygen   Oxygen Continuous    Liters 4      Recumbant Bike   Level 2.5    Watts 15    Minutes 15    METs 2.53      NuStep   Level 3    Minutes 15      REL-XR   Level 1    Minutes 15    METs 2      Oxygen   Maintain Oxygen Saturation 88% or higher             Nutrition:  Target Goals: Understanding of nutrition guidelines, daily intake of  sodium 1500mg , cholesterol 200mg , calories 30% from fat and 7% or less from saturated fats, daily to have 5 or more servings of fruits and vegetables.  Education: All About Nutrition: -Group instruction provided by verbal, written material, interactive activities, discussions, models, and posters to present general guidelines for heart healthy nutrition including fat, fiber, MyPlate, the role of sodium in heart healthy nutrition, utilization of the nutrition label, and utilization of this knowledge for meal planning. Follow up email sent as well. Written material given at graduation. Flowsheet Row Pulmonary Rehab from 05/04/2023 in [facility]  Education need identified 03/28/23       Biometrics:  Pre Biometrics - 03/28/23 1657       Pre Biometrics   Height 5\' 4"  (1.626 m)    Weight 200 lb 9.6 oz (91 kg)    Waist Circumference 45 inches    Hip Circumference 43 inches    Waist to Hip Ratio 1.05 %    BMI (Calculated) 34.42    Single Leg Stand 8.06 seconds              Nutrition Therapy Plan and Nutrition Goals:  Nutrition Therapy & Goals - 03/28/23 1818       Intervention Plan   Intervention Prescribe, educate and counsel regarding individualized specific dietary modifications aiming towards targeted core components such as weight, hypertension, lipid management, diabetes, heart failure and other comorbidities.    Expected  Outcomes Short Term Goal: Understand basic principles of dietary content, such as calories, fat, sodium, cholesterol and nutrients.;Short Term Goal: A plan has been developed with personal nutrition goals set during dietitian appointment.;Long Term Goal: Adherence to prescribed nutrition plan.             Nutrition Assessments:  MEDIFICTS Score Key: ?70 Need to make dietary changes  40-70 Heart Healthy Diet ? 40 Therapeutic Level Cholesterol Diet  Flowsheet Row Pulmonary Rehab from 03/28/2023 in [facility] Cardiac and Pulmonary Rehab   Picture Your Plate Total Score on Admission 53      Picture Your Plate Scores: <25 Unhealthy dietary pattern with much room for improvement. 41-50 Dietary pattern unlikely to meet recommendations for good health and room for improvement. 51-60 More healthful dietary pattern, with some room for improvement.  >60 Healthy dietary pattern, although there may be some specific behaviors that could be improved.   Nutrition Goals Re-Evaluation:  Nutrition Goals Re-Evaluation     Row Name 04/25/23 1348             Goals   Current Weight 193 lb (87.5 kg)       Comment Patient was informed on why it is important to maintain a balanced diet when dealing with Respiratory issues. Explained that it takes a lot of energy to breath and when they are short of breath often they will need to have a good diet to help keep up with the calories they are expending for breathing.       Expected Outcome Short: Choose and plan snacks accordingly to patients caloric intake to improve breathing. Long: Maintain a diet independently that meets their caloric intake to aid in daily shortness of breath.                Nutrition Goals Discharge (Final Nutrition Goals Re-Evaluation):  Nutrition Goals Re-Evaluation - 04/25/23 1348       Goals   Current Weight 193 lb (87.5 kg)    Comment Patient was informed on why it is important to maintain a balanced diet when dealing with Respiratory issues. Explained that it takes a lot of energy to breath and when they are short of breath often they will need to have a good diet to help keep up with the calories they are expending for breathing.    Expected Outcome Short: Choose and plan snacks accordingly to patients caloric intake to improve breathing. Long: Maintain a diet independently that meets their caloric intake to aid in daily shortness of breath.             Psychosocial: Target Goals: Acknowledge presence or absence of significant depression and/or  stress, maximize coping skills, provide positive support system. Participant is able to verbalize types and ability to use techniques and skills needed for reducing stress and depression.   Education: Stress, Anxiety, and Depression - Group verbal and visual presentation to define topics covered.  Reviews how body is impacted by stress, anxiety, and depression.  Also discusses healthy ways to reduce stress and to treat/manage anxiety and depression.  Written material given at graduation. Flowsheet Row Pulmonary Rehab from 03/31/2018 in [facility] Cardiac and Pulmonary Rehab  Date 02/08/18  Educator [facility]  Instruction Review Code 1- [employer] Understanding       Education: Sleep Hygiene -Provides group verbal and written instruction about how sleep can affect your health.  Define sleep hygiene, discuss sleep cycles and impact of sleep habits. Review good sleep hygiene tips.  Initial Review & Psychosocial Screening:  Initial Psych Review & Screening - 03/21/23 1116       Initial Review   Current issues with Current Psychotropic Meds;Current Depression;Current Anxiety/Panic      Family Dynamics   Good Support System? Yes   daughters  live close, brother in law takes him to appts     Barriers   Psychosocial barriers to participate in program There are no identifiable barriers or psychosocial needs.      Screening Interventions   Interventions Encouraged to exercise;To provide support and resources with identified psychosocial needs;Provide feedback about the scores to participant    Expected Outcomes Short Term goal: Utilizing psychosocial counselor, staff and physician to assist with identification of specific Stressors or current issues interfering with healing process. Setting desired goal for each stressor or current issue identified.;Long Term Goal: Stressors or current issues are controlled or eliminated.;Short Term goal: Identification and review with participant of any Quality of Life or  Depression concerns found by scoring the questionnaire.;Long Term goal: The participant improves quality of Life and PHQ9 Scores as seen by post scores and/or verbalization of changes             Quality of Life Scores:  Scores of 19 and below usually indicate a poorer quality of life in these areas.  A difference of  2-3 points is a clinically meaningful difference.  A difference of 2-3 points in the total score of the Quality of Life Index has been associated with significant improvement in overall quality of life, self-image, physical symptoms, and general health in studies assessing change in quality of life.  PHQ-9: Review Flowsheet  More data exists      05/23/2023 04/25/2023 03/28/2023 07/07/2018 05/22/2018  Depression screen PHQ 2/9  Decreased Interest 2 2 3 2 2   Down, Depressed, Hopeless 2 3 3 2 2   PHQ - 2 Score 4 5 6 4 4   Altered sleeping 2 2 2 2 1   Tired, decreased energy 2 2 2 2 1   Change in appetite 1 2 2 2 1   Feeling bad or failure about yourself  2 3 2 2 1   Trouble concentrating 2 2 2  0 0  Moving slowly or fidgety/restless 2 2 2  0 0  Suicidal thoughts 0 0 0 0 0  PHQ-9 Score 15 18 18 12 8   Difficult doing work/chores Somewhat difficult Somewhat difficult Somewhat difficult - -   Interpretation of Total Score  Total Score Depression Severity:  1-4 = Minimal depression, 5-9 = Mild depression, 10-14 = Moderate depression, 15-19 = Moderately severe depression, 20-27 = Severe depression   Psychosocial Evaluation and Intervention:  Psychosocial Evaluation - 03/21/23 1125       Psychosocial Evaluation & Interventions   Comments Dmon has no barriers to attending the program. He lives alone. HIs daughter and brother in law are his support. He has history of depression/anxiety and is on medications managed by his primary physician. He wants to see if he can improve his breathing and be able to perform activities longer than he is at this time. He does wear oxygen all day and  night and is compliant. He is ready to return to the program. He was here in 2019.    He is still greiving the loss of his wife in 2019;they had been married 40 years.    Expected Outcomes STG Elmo will attend all scheduled sessions, he will continue to work on controlling his depression/anxiety symptoms. He will follow the  exercise progression to work on improving his breathing. LTG Waldon maintiais control of his depression/anxiety symptoms. He is able to be a bit more active without increased shortness of breath.    Continue Psychosocial Services  Follow up required by staff             Psychosocial Re-Evaluation:  Psychosocial Re-Evaluation     Row Name 04/25/23 1353 05/23/23 1359           Psychosocial Re-Evaluation   Current issues with Current Anxiety/Panic;History of Depression;Current Depression;Current Psychotropic Meds Current Anxiety/Panic;History of Depression;Current Depression;Current Psychotropic Meds      Comments Reviewed patient health questionnaire (PHQ-9) with patient for follow up. Previously, patients score indicated signs/symptoms of depression.  Reviewed to see if patient is improving symptom wise while in program.  Score stayed the same and patient states that it is because he has some depression. He is going to meet with his doctor about his depression. Reviewed patient health questionnaire (PHQ-9) with patient for follow up. Previously, patients score indicated signs/symptoms of depression.  Reviewed to see if patient is improving symptom wise while in program.  Score improved and patient states that it is because he has  the motivation to get better.      Expected Outcomes Short: Continue to work toward an improvement in PHQ9 scores by attending [facility] regularly. Long: Continue to improve stress and depression coping skills by talking with staff and attending [facility] regularly and work toward a positive mental state. Short: Continue to attend  [facility] regularly for regular exercise and social engagement. Long: Continue to improve symptoms and manage a positive mental state.      Interventions -- Encouraged to attend Pulmonary Rehabilitation for the exercise      Continue Psychosocial Services  Follow up required by staff Follow up required by staff               Psychosocial Discharge (Final Psychosocial Re-Evaluation):  Psychosocial Re-Evaluation - 05/23/23 1359       Psychosocial Re-Evaluation   Current issues with Current Anxiety/Panic;History of Depression;Current Depression;Current Psychotropic Meds    Comments Reviewed patient health questionnaire (PHQ-9) with patient for follow up. Previously, patients score indicated signs/symptoms of depression.  Reviewed to see if patient is improving symptom wise while in program.  Score improved and patient states that it is because he has  the motivation to get better.    Expected Outcomes Short: Continue to attend [facility] regularly for regular exercise and social engagement. Long: Continue to improve symptoms and manage a positive mental state.    Interventions Encouraged to attend Pulmonary Rehabilitation for the exercise    Continue Psychosocial Services  Follow up required by staff             Education: Education Goals: Education classes will be provided on a weekly basis, covering required topics. Participant will state understanding/return demonstration of topics presented.  Learning Barriers/Preferences:   General Pulmonary Education Topics:  Infection Prevention: - Provides verbal and written material to individual with discussion of infection control including proper hand washing and proper equipment cleaning during exercise session. Flowsheet Row Pulmonary Rehab from 05/04/2023 in [facility] Cardiac and Pulmonary Rehab  Date 03/28/23  Educator [facility]  Instruction Review Code 1- Verbalizes Understanding       Falls Prevention: - Provides verbal and written  material to individual with discussion of falls prevention and safety. Flowsheet Row Pulmonary Rehab from 05/04/2023 in [facility] Cardiac and Pulmonary Rehab  Date 03/28/23  Educator  [facility]  Instruction Review Code 5- Refused Teaching       Chronic Lung Disease Review: - Group verbal instruction with posters, models, PowerPoint presentations and videos,  to review new updates, new respiratory medications, new advancements in procedures and treatments. Providing information on websites and "800" numbers for continued self-education. Includes information about supplement oxygen, available portable oxygen systems, continuous and intermittent flow rates, oxygen safety, concentrators, and Medicare reimbursement for oxygen. Explanation of Pulmonary Drugs, including class, frequency, complications, importance of spacers, rinsing mouth after steroid MDI's, and proper cleaning methods for nebulizers. Review of basic lung anatomy and physiology related to function, structure, and complications of lung disease. Review of risk factors. Discussion about methods for diagnosing sleep apnea and types of masks and machines for OSA. Includes a review of the use of types of environmental controls: home humidity, furnaces, filters, dust mite/pet prevention, HEPA vacuums. Discussion about weather changes, air quality and the benefits of nasal washing. Instruction on Warning signs, infection symptoms, calling MD promptly, preventive modes, and value of vaccinations. Review of effective airway clearance, coughing and/or vibration techniques. Emphasizing that all should Create an Action Plan. Written material given at graduation. Flowsheet Row Pulmonary Rehab from 05/04/2023 in [facility] Cardiac and Pulmonary Rehab  Education need identified 03/28/23  Date 05/04/23  Educator [facility]  Instruction Review Code 1- Verbalizes Understanding       AED/CPR: - Group verbal and written instruction with the use of models to demonstrate the basic use  of the AED with the basic ABC's of resuscitation. Flowsheet Row Pulmonary Rehab from 03/31/2018 in [facility] Card[facility] and Pulmonary Rehab  Date 02/17/18  Educator [facility]  Instruction Review Code 1- Verbalizes Understanding        Anatomy and Cardiac Procedures: - Group verbal and visual presentation and models provide information about basic cardiac anatomy and function. Reviews the testing methods done to diagnose heart disease and the outcomes of the test results. Describes the treatment choices: Medical Management, Angioplasty, or Coronary Bypass Surgery for treating various heart conditions including Myocardial Infarction, Angina, Valve Disease, and Cardiac Arrhythmias.  Written material given at graduation.   Medication Safety: - Group verbal and visual instruction to review commonly prescribed medications for heart and lung disease. Reviews the medication, class of the drug, and side effects. Includes the steps to properly store meds and maintain the prescription regimen.  Written material given at graduation. Flowsheet Row Pulmonary Rehab from 03/31/2018 in [place] [facility] and Pulmonary Rehab  Date 02/03/18  Educator [facility]  Instruction Review Code 1- Verbalizes Understanding       Other: -Provides group and verbal instruction on various topics (see comments)   Knowledge Questionnaire Score:  Knowledge Questionnaire Score - 03/28/23 1702       Knowledge Questionnaire Score   Pre Score 12/18              Core Components/Risk Factors/Patient Goals at Admission:  Personal Goals and Risk Factors at Admission - 03/28/23 1657       Core Components/Risk Factors/Patient Goals on Admission    Weight Management Yes    Intervention Weight Management: Develop a combined nutrition and exercise program designed to reach desired caloric intake, while maintaining appropriate intake of nutrient and fiber, sodium and fats, and appropriate energy expenditure required for the weight goal.;Weight  Management: Provide education and appropriate resources to help participant work on and attain dietary goals.;Weight Management/Obesity: Establish reasonable short term and long term weight goals.    Admit Weight 200 lb 9.6  oz (91 kg)    Goal Weight: Short Term 190 lb (86.2 kg)    Goal Weight: Long Term 175 lb (79.4 kg)    Expected Outcomes Short Term: Continue to assess and modify interventions until short term weight is achieved;Long Term: Adherence to nutrition and physical activity/exercise program aimed toward attainment of established weight goal;Weight Loss: Understanding of general recommendations for a balanced deficit meal plan, which promotes 1-2 lb weight loss per week and includes a negative energy balance of [phone] kcal/d;Understanding recommendations for meals to include 15-35% energy as protein, 25-35% energy from fat, 35-60% energy from carbohydrates, less than 200mg  of dietary cholesterol, 20-35 gm of total fiber daily;Understanding of distribution of calorie intake throughout the day with the consumption of 4-5 meals/snacks    Improve shortness of breath with ADL's Yes    Intervention Provide education, individualized exercise plan and daily activity instruction to help decrease symptoms of SOB with activities of daily living.    Expected Outcomes Short Term: Improve cardiorespiratory fitness to achieve a reduction of symptoms when performing ADLs;Long Term: Be able to perform more ADLs without symptoms or delay the onset of symptoms    Increase knowledge of respiratory medications and ability to use respiratory devices properly  Yes    Intervention Provide education and demonstration as needed of appropriate use of medications, inhalers, and oxygen therapy.    Expected Outcomes Short Term: Achieves understanding of medications use. Understands that oxygen is a medication prescribed by physician. Demonstrates appropriate use of inhaler and oxygen therapy.;Long Term: Maintain  appropriate use of medications, inhalers, and oxygen therapy.    Diabetes Yes   no meds, getting A1C checked soon to confirm proper diagnosis   Intervention Provide education about signs/symptoms and action to take for hypo/hyperglycemia.;Provide education about proper nutrition, including hydration, and aerobic/resistive exercise prescription along with prescribed medications to achieve blood glucose in normal ranges: Fasting glucose 65-99 mg/dL    Expected Outcomes Short Term: Participant verbalizes understanding of the signs/symptoms and immediate care of hyper/hypoglycemia, proper foot care and importance of medication, aerobic/resistive exercise and nutrition plan for blood glucose control.;Long Term: Attainment of HbA1C < 7%.    Heart Failure Yes    Intervention Provide a combined exercise and nutrition program that is supplemented with education, support and counseling about heart failure. Directed toward relieving symptoms such as shortness of breath, decreased exercise tolerance, and extremity edema.    Expected Outcomes Improve functional capacity of life;Short term: Attendance in program 2-3 days a week with increased exercise capacity. Reported lower sodium intake. Reported increased fruit and vegetable intake. Reports medication compliance.;Short term: Daily weights obtained and reported for increase. Utilizing diuretic protocols set by physician.;Long term: Adoption of self-care skills and reduction of barriers for early signs and symptoms recognition and intervention leading to self-care maintenance.    Hypertension Yes    Intervention Provide education on lifestyle modifcations including regular physical activity/exercise, weight management, moderate sodium restriction and increased consumption of fresh fruit, vegetables, and low fat dairy, alcohol moderation, and smoking cessation.;Monitor prescription use compliance.    Expected Outcomes Short Term: Continued assessment and intervention  until BP is < 140/32mm HG in hypertensive participants. < 130/72mm HG in hypertensive participants with diabetes, heart failure or chronic kidney disease.;Long Term: Maintenance of blood pressure at goal levels.    Lipids Yes    Intervention Provide education and support for participant on nutrition & aerobic/resistive exercise along with prescribed medications to achieve LDL 70mg , HDL >40mg .  Expected Outcomes Short Term: Participant states understanding of desired cholesterol values and is compliant with medications prescribed. Participant is following exercise prescription and nutrition guidelines.;Long Term: Cholesterol controlled with medications as prescribed, with individualized exercise RX and with personalized nutrition plan. Value goals: LDL < 70mg , HDL > 40 mg.    Stress Yes    Intervention Offer individual and/or small group education and counseling on adjustment to heart disease, stress management and health-related lifestyle change. Teach and support self-help strategies.;Refer participants experiencing significant psychosocial distress to appropriate mental health specialists for further evaluation and treatment. When possible, include family members and significant others in education/counseling sessions.    Expected Outcomes Short Term: Participant demonstrates changes in health-related behavior, relaxation and other stress management skills, ability to obtain effective social support, and compliance with psychotropic medications if prescribed.;Long Term: Emotional wellbeing is indicated by absence of clinically significant psychosocial distress or social isolation.             Education:Diabetes - Individual verbal and written instruction to review signs/symptoms of diabetes, desired ranges of glucose level fasting, after meals and with exercise. Acknowledge that pre and post exercise glucose checks will be done for 3 sessions at entry of program.   Know Your Numbers and Heart  Failure: - Group verbal and visual instruction to discuss disease risk factors for cardiac and pulmonary disease and treatment options.  Reviews associated critical values for Overweight/Obesity, Hypertension, Cholesterol, and Diabetes.  Discusses basics of heart failure: signs/symptoms and treatments.  Introduces Heart Failure Zone chart for action plan for heart failure.  Written material given at graduation.   Core Components/Risk Factors/Patient Goals Review:   Goals and Risk Factor Review     Row Name 04/25/23 1347 05/23/23 1356           Core Components/Risk Factors/Patient Goals Review   Personal Goals Review Improve shortness of breath with ADL's Other      Review Spoke to patient about their shortness of breath and what they can do to improve. Patient has been informed of breathing techniques when starting the program. Patient is informed to tell staff if they have had any med changes and that certain meds they are taking or not taking can be causing shortness of breath. Spoke to patient about COPD Action Plan. Reviewed and talked with the patient about the different levels of COPD severity that they should review daily and the steps they can take to manage their COPD. Went over pertinent actions for the patient can take when they feel like they are having a COPD exacerbation and the necessary treatment if needed. If patient has any changes with their breathing or feel like they are in a different zone of severity to inform staff for re-evaluation. Patient verbalizes understanding. Copy given to patient.      Expected Outcomes Short: Attend [facility] regularly to improve shortness of breath with ADL's. Long: maintain independence with ADL's Short: Follow COPD action plan. Long: Report any changes and severity of COPD.               Core Components/Risk Factors/Patient Goals at Discharge (Final Review):   Goals and Risk Factor Review - 05/23/23 1356       Core Components/Risk  Factors/Patient Goals Review   Personal Goals Review Other    Review Spoke to patient about COPD Action Plan. Reviewed and talked with the patient about the different levels of COPD severity that they should review daily and the steps they can take to  manage their COPD. Went over pertinent actions for the patient can take when they feel like they are having a COPD exacerbation and the necessary treatment if needed. If patient has any changes with their breathing or feel like they are in a different zone of severity to inform staff for re-evaluation. Patient verbalizes understanding. Copy given to patient.    Expected Outcomes Short: Follow COPD action plan. Long: Report any changes and severity of COPD.             ITP Comments:  ITP Comments     Row Name 03/21/23 1132 03/28/23 1643 03/29/23 1132 04/06/23 0904 04/13/23 1350   ITP Comments Virtual orientation call completed today. he has an appointment on Date: 03/28/2023  for EP eval and gym Orientation.  Documentation of diagnosis can be found in [facility] Date: 03/01/2023 . Completed and gym orientation. Initial ITP created and sent for review to Dr. Jinny Sanders, Medical Director. Patient called to let us know he is scheduled for a heart cath on 4/15, and has to delay his start date with pulmonary rehab with [place]. Patient will need clearance to return- patient aware and will call us to let us know when he is able to return with clearance. 30 day review completed. ITP sent to Dr. Jinny Sanders, Medical Director of  Pulmonary Rehab. Continue with ITP unless changes are made by physician.  Delayed start, no exercise sessions yet. First full day of exercise!  Patient was oriented to gym and equipment including functions, settings, policies, and procedures.  Patient's individual exercise prescription and treatment plan were reviewed.  All starting workloads were established based on the results of the 6 minute walk test done at initial orientation  visit.  The plan for exercise progression was also introduced and progression will be customized based on patient's performance and goals.    Row Name 05/04/23 0830 05/31/23 1101 06/01/23 0920       ITP Comments 30 Day review completed. Medical Director ITP review done, changes made as directed, and signed approval by Medical Director.   new to program Isaid saw his pulmonologist today.  They would like to pull him on medical hold for rehab at this time.  They feel that his reflux and cancer treatment responses are hindering his breathing on top of the exercise and not letting him make a recovery.  They would like him to hold off until he is feeling better.  Doctor said hold until follow up in August.  Avari wants to call her back in about 4-6 weeks to see how he is doing.  He was told to keep up updated and to call us to sign up for time slot prior to returning to rehab in August. 30 Day review completed. Medical Director ITP review done, changes made as directed, and signed approval by Medical Director.    on medical hold              Comments:

## 2023-06-07 ENCOUNTER — Other Ambulatory Visit: Payer: Self-pay | Admitting: Pulmonary Disease

## 2023-06-07 ENCOUNTER — Other Ambulatory Visit: Payer: Self-pay

## 2023-06-07 MED ORDER — BUDESONIDE 0.5 MG/2ML IN SUSP: 0.5000 mg | Freq: Two times a day (BID) | RESPIRATORY_TRACT | 10 refills | Status: DC

## 2023-06-08 ENCOUNTER — Ambulatory Visit: Payer: Medicare Other | Attending: Radiation Oncology | Admitting: Radiation Oncology

## 2023-06-16 ENCOUNTER — Other Ambulatory Visit: Payer: Self-pay | Admitting: Pulmonary Disease

## 2023-06-17 ENCOUNTER — Ambulatory Visit: Payer: Medicare Other | Admitting: Nurse Practitioner

## 2023-06-20 ENCOUNTER — Encounter: Payer: Self-pay | Admitting: Pulmonary Disease

## 2023-06-27 ENCOUNTER — Other Ambulatory Visit
Admission: RE | Admit: 2023-06-27 | Discharge: 2023-06-27 | Disposition: A | Discharge status: Home / Self Care | Payer: Medicare Other | Source: Ambulatory Visit | Attending: Cardiology | Admitting: Cardiology

## 2023-06-27 ENCOUNTER — Ambulatory Visit (HOSPITAL_BASED_OUTPATIENT_CLINIC_OR_DEPARTMENT_OTHER): Payer: Medicare Other | Admitting: Cardiology

## 2023-06-27 ENCOUNTER — Other Ambulatory Visit (HOSPITAL_COMMUNITY): Payer: Self-pay

## 2023-06-27 VITALS — BP 122/79 | HR 103 | Ht 70.0 in | Wt 197.0 lb

## 2023-06-27 DIAGNOSIS — I2510 Atherosclerotic heart disease of native coronary artery without angina pectoris: Secondary | ICD-10-CM | POA: Diagnosis not present

## 2023-06-27 DIAGNOSIS — I5022 Chronic systolic (congestive) heart failure: Secondary | ICD-10-CM | POA: Insufficient documentation

## 2023-06-27 DIAGNOSIS — Z9981 Dependence on supplemental oxygen: Secondary | ICD-10-CM | POA: Diagnosis not present

## 2023-06-27 DIAGNOSIS — Z8249 Family history of ischemic heart disease and other diseases of the circulatory system: Secondary | ICD-10-CM | POA: Insufficient documentation

## 2023-06-27 DIAGNOSIS — I5032 Chronic diastolic (congestive) heart failure: Secondary | ICD-10-CM | POA: Diagnosis not present

## 2023-06-27 DIAGNOSIS — I252 Old myocardial infarction: Secondary | ICD-10-CM | POA: Diagnosis not present

## 2023-06-27 DIAGNOSIS — Z79899 Other long term (current) drug therapy: Secondary | ICD-10-CM | POA: Diagnosis not present

## 2023-06-27 DIAGNOSIS — Z87891 Personal history of nicotine dependence: Secondary | ICD-10-CM | POA: Diagnosis not present

## 2023-06-27 DIAGNOSIS — Z7982 Long term (current) use of aspirin: Secondary | ICD-10-CM | POA: Insufficient documentation

## 2023-06-27 DIAGNOSIS — J449 Chronic obstructive pulmonary disease, unspecified: Secondary | ICD-10-CM | POA: Insufficient documentation

## 2023-06-27 DIAGNOSIS — I255 Ischemic cardiomyopathy: Secondary | ICD-10-CM | POA: Insufficient documentation

## 2023-06-27 LAB — BASIC METABOLIC PANEL
Anion gap: 10 (ref 5–15)
BUN: 18 mg/dL (ref 8–23)
CO2: 27 mmol/L (ref 22–32)
Calcium: 8.7 mg/dL — ABNORMAL LOW (ref 8.9–10.3)
Chloride: 101 mmol/L (ref 98–111)
Creatinine, Ser: 0.94 mg/dL (ref 0.61–1.24)
GFR, Estimated: >60 mL/min (ref >60)
Glucose, Bld: 134 mg/dL — ABNORMAL HIGH (ref 70–99)
Potassium: 3.6 mmol/L (ref 3.5–5.1)
Sodium: 138 mmol/L (ref 135–145)

## 2023-06-27 LAB — LIPID PANEL
Cholesterol: 120 mg/dL (ref 0–200)
HDL: 48 mg/dL (ref >40)
LDL Cholesterol: 48 mg/dL (ref 0–99)
Total CHOL/HDL Ratio: 2.5 RATIO
Triglycerides: 118 mg/dL (ref <150)
VLDL: 24 mg/dL (ref 0–40)

## 2023-06-27 LAB — BRAIN NATRIURETIC PEPTIDE: B Natriuretic Peptide: 25 pg/mL (ref 0.0–100.0)

## 2023-06-27 MED ORDER — ENTRESTO 24-26 MG PO TABS: 1.0000 | ORAL_TABLET | Freq: Two times a day (BID) | ORAL | 6 refills | Status: DC

## 2023-06-27 MED ORDER — SPIRONOLACTONE 25 MG PO TABS
12.5000 mg | ORAL_TABLET | Freq: Every evening | ORAL | 6 refills | Status: DC
Start: 2023-06-27 — End: 2023-11-29

## 2023-06-27 MED ORDER — FUROSEMIDE 20 MG PO TABS: 20.0000 mg | ORAL_TABLET | ORAL | 1 refills | Status: DC | PRN

## 2023-06-27 NOTE — Patient Instructions (Addendum)
Medication Changes:  Please use your Lasix only as needed.   Please stop taking Losartan.  Restart Spironolactone 12.5 mg (0.5 tablet) every evening.  Start Entresto 24/26 mg (1 tablet) 2 times a day.   Lab Work:  Labs done today, your results will be available in MyChart, we will contact you for abnormal readings.   Special Instructions // Education:  Do the following things EVERYDAY: Weigh yourself in the morning before breakfast. Write it down and keep it in a log. Take your medicines as prescribed Eat low salt foods--Limit salt (sodium) to 2000 mg per day.  Stay as active as you can everyday Limit all fluids for the day to less than 2 liters   Follow-Up in: please follow up in 6 weeks with Dr. Shirlee Latch.     If you have any questions or concerns before your next appointment please send [place] a message through [place] or call our office at [phone] Monday-Friday 8 am-5 pm.   If you have an urgent need after hours on the weekend please call your Primary Cardiologist or the [facility] in [place] at [phone].

## 2023-06-27 NOTE — Progress Notes (Signed)
PCP: [name], MD Cardiology: Dr. Kirke Corin HF Cardiology: Dr. Shirlee Latch  72 y.o. with history of CAD, ischemic cardiomyopathy, and severe COPD on home oxygen was referred by Dr. Kirke Corin for evaluation of heart failure. Patient had an anterior MI in 2010.  At that time, he had DES to LAD and to RCA.  Repeat caths in 2017 and in 4/24 have shown patent stents and minimal progression of CAD.  Echoes showed EF 40-45% generally, then echo in 9/23 showed EF up to 50-55% though study was technically difficult.  LV-gram with cath in 4/24 suggested lower EF at 25-35%. Patient was a heavy smoker in the past, quit in 2020.  He has severe COPD and wears 3L home oxygen. 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    Due to progressive dyspnea and atypical chest pain, he had RHC/LHC done in 4/24.  Results of angiography and LV-gram as above, RHC showed normal filling pressures and preserved cardiac output.  He went to the ER yesterday with cough and worsening dyspnea, he was thought to have a COPD exacerbation and was given prednisone and nebs.    Echo in 5/24 showed EF 35-40%, mild LV dilation, normal RV.    He has been overall breathing better in recent weeks.  No wheezing.  He does have a hard time in the extreme heat.  No lightheadedness.  Still has episodes of atypical chest burning (nonexertional, no change since prior to last cath).  No dyspnea walking around house, gets short of breath if he walks longer distances or up inclines.  He continues to wear 3L home oxygen. He is off spironolactone as he thought it made his breathing worse.    Labs (2/24): LDL 47 Labs (4/24): hgb 13.3, K 4.1, creatinine 0.95 Labs (6/24): BNP 42, K 3.9, creatinine 0.93  PMH: 1. CAD: Anterior MI in 2010 with DES to LAD and RCA.  - LHC (2017): Patent stents.  - LHC (4/24): Mild nonobstructive CAD with patent stents.  2. Chronic systolic  CHF: Ischemic cardiomyopathy.   - Echo (2019): EF 40-45% - Echo (2/22): EF 40-45% - Echo (9/23): Technically difficult, EF 50-55%.  - LV-gram (4/24): EF 25-35% - RHC (4/24): mean RA 6, PA 38/15, mean PCWP 15, CI 3.16 3. COPD: On 3.5 L home oxygen, severe. Quit smoking 2020.  4. H/o PVCs 5. Esophageal squamous cell carcinoma: S/p chemo-radiation.  6. Prostate cancer: Radiation.   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  Vaping Use   Vaping Use: Never used  Substance and Sexual Activity   Alcohol use: No    Alcohol/week: 0.0 standard drinks of alcohol   Drug use: No   Sexual activity: Yes  Other Topics Concern   Not  on file  Social History Narrative   Marital status: Widowed. Lives alone.    Social Determinants of Health   Financial Resource Strain: Not on file  Food Insecurity: No Food Insecurity (11/14/2022)   Hunger Vital Sign    Worried About Running Out of Food in the Last Year: Never true    Ran Out of Food in the Last Year: Never true  Transportation Needs: No Transportation Needs (11/14/2022)   PRAPARE - Administrator, Civil Service (Medical): No    Lack of Transportation (Non-Medical): No  Physical Activity: Not on file  Stress: Not on file  Social Connections: Not on file  Intimate Partner Violence: Not At Risk (11/14/2022)   Humiliation, Afraid, Rape, and Kick questionnaire    Fear of Current or Ex-Partner:  No    Emotionally Abused: No    Physically Abused: No    Sexually Abused: No   Family History  Problem Relation Age of Onset   Heart attack Brother        Brother #1   Diabetes Brother    Hypertension Brother        #3   Coronary artery disease Father 25       deceased   Heart attack Father    Diabetes Father    Heart disease Father    COPD Mother 61       deceased   Alcohol abuse Sister        polysubstance abuse   COPD Sister    Lung cancer Sister    Alcohol abuse Sister        polysubstance abuse   Penile cancer Brother    Diabetes Brother    Prostate cancer Neg Hx    Bladder Cancer Neg Hx    Kidney cancer Neg Hx    ROS: All systems reviewed and negative except as per HPI.   Current Outpatient Medications  Medication Sig Dispense Refill   albuterol (VENTOLIN HFA) 108 (90 Base) MCG/ACT inhaler TAKE 2 PUFFS BY MOUTH EVERY 6 HOURS AS NEEDED FOR WHEEZE OR SHORTNESS OF BREATH 18 each 2   ALPRAZolam (XANAX) 0.5 MG tablet Take 0.5 mg by mouth 3 (three) times daily.     aspirin 81 MG tablet Take 81 mg by mouth daily.     atorvastatin (LIPITOR) 80 MG tablet Take 1 tablet (80 mg total) by mouth daily. 90 tablet 3   budesonide (PULMICORT) 0.5 MG/2ML nebulizer solution Take 2 mLs (0.5 mg total) by nebulization in the morning and at bedtime. 360 mL 10   dapagliflozin propanediol (FARXIGA) 10 MG TABS tablet Take 1 tablet (10 mg total) by mouth daily before breakfast. 30 tablet 6   fluticasone (FLONASE) 50 MCG/ACT nasal spray Place 2 sprays into both nostrils daily.     ipratropium-albuterol (DUONEB) 0.5-2.5 (3) MG/3ML SOLN Take 3 mLs by nebulization 2 (two) times daily.     ipratropium-albuterol (DUONEB) 0.5-2.5 (3) MG/3ML SOLN Take 3 mLs by nebulization every 6 (six) hours as needed. 360 mL 11   isosorbide mononitrate (IMDUR) 30 MG 24 hr tablet Take 1 tablet (30 mg total) by mouth daily. 90 tablet 3   ketoconazole (NIZORAL) 2 % cream Apply topically 2 (two) times daily.      loratadine (CLARITIN) 10 MG tablet Take 10 mg by mouth daily.     mirtazapine (REMERON) 45 MG tablet Take 45 mg by mouth at bedtime.     Multiple Vitamin (MULTIVITAMIN) capsule Take 1 capsule by mouth  daily.     OXYGEN Inhale 3 L into the lungs daily.     pantoprazole (PROTONIX) 40 MG tablet Take 1 tablet (40 mg total) by mouth 2 (two) times daily before a meal. 60 tablet 3   sacubitril-valsartan (ENTRESTO) 24-26 MG Take 1 tablet by mouth 2 (two) times daily. 60 tablet 6   Spacer/Aero-Holding Chambers (AEROCHAMBER MV) inhaler Use as instructed 1 each 2   furosemide (LASIX) 20 MG tablet Take 1 tablet (20 mg total) by mouth as needed. 30 tablet 1   nitroGLYCERIN (NITROSTAT) 0.4 MG SL tablet Place 1 tablet (0.4 mg total) under the tongue every 5 (five) minutes as needed. (Patient not taking: Reported on 06/27/2023) 25 tablet 3   spironolactone (ALDACTONE) 25 MG tablet Take 0.5 tablets (12.5 mg total) by mouth every evening. 30 tablet 6   No current facility-administered medications for this visit.   BP 122/79   Pulse (!) 103   Ht 5\' 10"  (1.778 m)   Wt 197 lb (89.4 kg)   SpO2 95%   BMI 28.27 kg/m  General: NAD Neck: No JVD, no thyromegaly or thyroid nodule.  Lungs: Distant BS CV: Nondisplaced PMI.  Heart regular S1/S2, no S3/S4, no murmur.  No peripheral edema.  No carotid bruit.  Normal pedal pulses.  Abdomen: Soft, nontender, no hepatosplenomegaly, no distention.  Skin: Intact without lesions or rashes.  Neurologic: Alert and oriented x 3.  Psych: Normal affect. Extremities: No clubbing or cyanosis.  HEENT: Normal.   Assessment/Plan: 1. Chronic systolic CHF: Ischemic cardiomyopathy.  Historically, EF has been in the 40-45% range, but echo in 9/23 showed EF 50-55% (technically difficult study).  LV-gram with cath in 4/24 suggested that EF may be down to the 25-35% range.  He had no new coronary disease on cath in 4/24 to explain fall in EF.  Echo in 5/24 showed EF 35-40%, mild LV dilation,  normal RV.  NYHA class III symptoms, but this is confounded by severe COPD.  He is not volume overloaded on exam.  - I think he can restart spironolactone 12.5 daily, I think he was short of breath due to a COPD flare when he took spironolactone and not the medication.  - Stop losartan, start Entresto 24/26 bid. BMET/BNP today and BMET in 10 days.   - Stop Lasix with starting Entresto.  - Continue Farxiga 10 mg daily.  - With severe COPD/exacerbations, would not use Coreg.  Could consider the more beta-1 selective bisoprolol at some point down the road.  - EF appears to be just out of range of ICD and narrow QRS precludes CRT use.  2. COPD: Severe, on 3 L home oxygen. Prior smoker.  As above, I suspect COPD is a major contributor to his dyspnea.  He does not appear volume overloaded on exam.  - Continue followup with pulmonary.  3.  CAD: Anterior MI in 2010 with DES to LAD and RCA.  Cath in 4/24 showed patent stents and mild nonobstructive CAD.  He has episodes of atypical chest pain likely related to COPD/wheezing.  - Continue ASA 81 daily.  - Continue atorvastatin 80 mg daily, check lipids.  - Continue Imdur for now, may be able to stop this in the future to allow more BP room for GDMT.   Followup in 6 weeks for med titration.   Leslie Sims 06/27/2023

## 2023-06-28 ENCOUNTER — Encounter: Payer: Self-pay | Admitting: *Deleted

## 2023-06-28 DIAGNOSIS — J449 Chronic obstructive pulmonary disease, unspecified: Secondary | ICD-10-CM

## 2023-06-28 NOTE — Progress Notes (Signed)
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)  Patient's Home Medications on Admission:  Current Outpatient Medications:    albuterol (VENTOLIN HFA) 108 (90 Base) MCG/ACT inhaler, TAKE 2 PUFFS BY MOUTH EVERY 6 HOURS AS NEEDED FOR WHEEZE OR SHORTNESS OF BREATH, Disp: 18 each, Rfl: 2   ALPRAZolam (XANAX) 0.5 MG tablet, Take 0.5 mg by mouth 3 (three) times daily., Disp: , Rfl:    aspirin 81 MG tablet, Take 81 mg by mouth daily., Disp: , Rfl:    atorvastatin (LIPITOR) 80 MG tablet, Take 1 tablet (80 mg total) by mouth daily., Disp: 90 tablet, Rfl: 3   budesonide (PULMICORT) 0.5 MG/2ML nebulizer solution, Take 2 mLs (0.5 mg total) by nebulization in the morning and at bedtime., Disp: 360 mL, Rfl: 10   dapagliflozin propanediol (FARXIGA) 10 MG TABS tablet, Take 1 tablet (10 mg total) by mouth daily before breakfast., Disp: 30 tablet, Rfl: 6   fluticasone (FLONASE) 50 MCG/ACT nasal spray, Place 2 sprays into both nostrils daily., Disp: , Rfl:    furosemide (LASIX) 20 MG tablet, Take 1 tablet (20 mg total) by mouth as needed., Disp: 30 tablet, Rfl: 1   ipratropium-albuterol (DUONEB) 0.5-2.5 (3) MG/3ML SOLN, Take 3 mLs by nebulization 2 (two) times daily., Disp: , Rfl:    ipratropium-albuterol (DUONEB) 0.5-2.5 (3) MG/3ML SOLN, Take 3 mLs by nebulization every 6 (six) hours as needed., Disp: 360 mL, Rfl: 11   isosorbide mononitrate (IMDUR) 30 MG 24 hr tablet, Take 1 tablet (30 mg total) by mouth daily., Disp: 90 tablet, Rfl: 3   ketoconazole (NIZORAL) 2 % cream, Apply topically 2 (two)  times daily., Disp: , Rfl:    loratadine (CLARITIN) 10 MG tablet, Take 10 mg by mouth daily., Disp: , Rfl:    mirtazapine (REMERON) 45 MG tablet, Take 45 mg by mouth at bedtime., Disp: , Rfl:    Multiple Vitamin (MULTIVITAMIN) capsule, Take 1 capsule by mouth daily., Disp: , Rfl:    nitroGLYCERIN (NITROSTAT) 0.4 MG SL tablet, Place 1 tablet (0.4 mg total) under the tongue every 5 (five) minutes as needed. (Patient not taking: Reported on 06/27/2023), Disp: 25 tablet, Rfl: 3   OXYGEN, Inhale 3 L into the lungs daily., Disp: , Rfl:    pantoprazole (PROTONIX) 40 MG tablet, Take 1 tablet (40 mg total) by mouth 2 (two) times daily before a meal., Disp: 60 tablet, Rfl: 3   sacubitril-valsartan (ENTRESTO) 24-26 MG, Take 1 tablet by mouth 2 (two) times daily., Disp: 60 tablet, Rfl: 6   Spacer/Aero-Holding Chambers (AEROCHAMBER MV) inhaler, Use as instructed, Disp: 1 each, Rfl: 2   spironolactone (ALDACTONE) 25 MG tablet, Take 0.5 tablets (12.5 mg total) by mouth every evening., Disp: 30 tablet, Rfl: 6  Past Medical History: Past Medical History:  Diagnosis Date   Allergic rhinitis    Anxiety    Back pain    Cancer (HCC)    prostate   CHF (congestive heart failure) (HCC)    Colon polyps 08/2012   colonoscopy; multiple colon polyps; repeat colonoscopy in one year.  Complication of anesthesia    COPD (chronic obstructive pulmonary disease) (HCC)    a. on home O2.   Coronary artery disease 11/2009   a. late presenting ant MI; b. LHC 100% pLAD s/p PCI/DES, 99% mRCA s/p PCI/DES, EF 35%; c. nuclear stress test 05/13: prior ant/inf infarcts w/o ischemia, EF 43%; d. LHC 02/14: widely patent stents with no other obs dz, EF 40%; e. LHC 11/17: LM nl, mLAD 10%, patent LAD stent, dLAD 20%, p-mRCA 10%, mRCA 40%, patent RCA stent, EF 35-45%    Depression    Diabetes (HCC)    Dysphagia    Family history of adverse reaction to anesthesia    GERD (gastroesophageal reflux disease)    Headache(784.0)     Helicobacter pylori (H. pylori)    Hemorrhoids    Hernia    inguinal   HFrEF (heart failure with reduced ejection fraction) (HCC)    a. 10/2016 LV gram: EF 35-45%; b. 06/2018 Echo: EF 40-45%. c. 01/2021 Echo: EF 40-45%   Hyperlipidemia    Hypertension    Insomnia    Ischemic cardiomyopathy    a. 10/2016 LV gram: EF 35-45%; b. 06/2018 Echo: EF 40-45%.   Myalgia    Oxygen dependent    Peptic ulcer    Pulmonary nodules    ST elevation (STEMI) myocardial infarction involving left anterior descending coronary artery (HCC) 11/2009   a. s/p PCI to the LAD   Status post dilation of esophageal narrowing 2000   Thickening of esophagus    Tinea pedis    Wears dentures    partial upper    Tobacco Use: Social History   Tobacco Use  Smoking Status Former   Packs/day: 3.00   Years: 42.00   Additional pack years: 0.00   Total pack years: 126.00   Types: Cigarettes   Quit date: 02/10/2019   Years since quitting: 4.3  Smokeless Tobacco Never  Tobacco Comments   25 months ago most smoked 4 packs a day .    Labs: Review Flowsheet  More data exists      Latest Ref Rng & Units 04/10/2020 03/18/2021 01/26/2023 04/04/2023 06/27/2023  Labs for ITP Cardiac and Pulmonary Rehab  Cholestrol 0 - 200 mg/dL - 161  096  - 045   LDL (calc) 0 - 99 mg/dL - 81  47  - 48   HDL-C >40 mg/dL - 69  87  - 48   Trlycerides <150 mg/dL - 77  409  - 811   PH, Arterial 7.35 - 7.45 - - - 7.317  -  PCO2 arterial 32 - 48 mmHg - - - 66.4  -  Bicarbonate 20.0 - 28.0 mmol/L 31.3  - - 34.0  29.2  -  TCO2 22 - 32 mmol/L - - - 36  31  -  O2 Saturation % 92.4  - - 96  70  -     Pulmonary Assessment Scores:  Pulmonary Assessment Scores     Row Name 03/28/23 1702         ADL UCSD   ADL Phase Entry     SOB Score total 77     Rest 1     Walk 3     Stairs 4     Bath 4     Dress 3     Shop 4       CAT Score   CAT Score 21       mMRC Score   mMRC Score 3  UCSD: Self-administered rating of  dyspnea associated with activities of daily living (ADLs) 6-point scale (0 = "not at all" to 5 = "maximal or unable to do because of breathlessness")  Scoring Scores range from 0 to 120.  Minimally important difference is 5 units  CAT: CAT can identify the health impairment of COPD patients and is better correlated with disease progression.  CAT has a scoring range of zero to 40. The CAT score is classified into four groups of low (less than 10), medium (10 - 20), high (21-30) and very high (31-40) based on the impact level of disease on health status. A CAT score over 10 suggests significant symptoms.  A worsening CAT score could be explained by an exacerbation, poor medication adherence, poor inhaler technique, or progression of COPD or comorbid conditions.  CAT MCID is 2 points  mMRC: mMRC (Modified Medical Research Council) Dyspnea Scale is used to assess the degree of baseline functional disability in patients of respiratory disease due to dyspnea. No minimal important difference is established. A decrease in score of 1 point or greater is considered a positive change.   Pulmonary Function Assessment:  Pulmonary Function Assessment - 03/28/23 1651       Pulmonary Function Tests   FVC% 40 %    FEV1% 20 %   post bronchodilator   FEV1/FVC Ratio 37             Exercise Target Goals: Exercise Program Goal: Individual exercise prescription set using results from initial 6 min walk test and THRR while considering  patient's activity barriers and safety.   Exercise Prescription Goal: Initial exercise prescription builds to 30-45 minutes a day of aerobic activity, 2-3 days per week.  Home exercise guidelines will be given to patient during program as part of exercise prescription that the participant will acknowledge.  Education: Aerobic Exercise: - Group verbal and visual presentation on the components of exercise prescription. Introduces F.I.T.T principle from ACSM for exercise  prescriptions.  Reviews F.I.T.T. principles of aerobic exercise including progression. Written material given at graduation.   Education: Resistance Exercise: - Group verbal and visual presentation on the components of exercise prescription. Introduces F.I.T.T principle from ACSM for exercise prescriptions  Reviews F.I.T.T. principles of resistance exercise including progression. Written material given at graduation.    Education: Exercise & Equipment Safety: - Individual verbal instruction and demonstration of equipment use and safety with use of the equipment. Flowsheet Row Pulmonary Rehab from 05/04/2023 in [facility] Cardiac and Pulmonary Rehab  Date 03/28/23  Educator [facility]  Instruction Review Code 1- Verbalizes Understanding       Education: Exercise Physiology & General Exercise Guidelines: - Group verbal and written instruction with models to review the exercise physiology of the cardiovascular system and associated critical values. Provides general exercise guidelines with specific guidelines to those with heart or lung disease.  Flowsheet Row Pulmonary Rehab from 05/04/2023 in [facility] Cardiac and Pulmonary Rehab  Education need identified 03/28/23       Education: Flexibility, Balance, Mind/Body Relaxation: - Group verbal and visual presentation with interactive activity on the components of exercise prescription. Introduces F.I.T.T principle from ACSM for exercise prescriptions. Reviews F.I.T.T. principles of flexibility and balance exercise training including progression. Also discusses the mind body connection.  Reviews various relaxation techniques to help reduce and manage stress (i.e. Deep breathing, progressive muscle relaxation, and visualization). Balance handout provided to take home. Written material given at graduation.   Activity Barriers & Risk Stratification:  Activity Barriers &  Cardiac Risk Stratification - 03/28/23 1647       Activity Barriers & Cardiac Risk  Stratification   Activity Barriers Shortness of Breath             6 Minute Walk:  6 Minute Walk     Row Name 03/28/23 1644         6 Minute Walk   Phase Initial     Distance 765 feet     Walk Time 4.75 minutes     # of Rest Breaks 2     MPH 1.83     METS 1.9     RPE 13     Perceived Dyspnea  3     VO2 Peak 6.64     Symptoms No     Resting HR 87 bpm     Resting BP 122/60     Resting Oxygen Saturation  94 %     Exercise Oxygen Saturation  during 6 min walk 80 %     Max Ex. HR 116 bpm     Max Ex. BP 148/64     2 Minute Post BP 126/80       Interval HR   1 Minute HR 104     2 Minute HR 101     3 Minute HR 105     4 Minute HR 103     5 Minute HR 112     6 Minute HR 116     2 Minute Post HR 93     Interval Heart Rate? Yes       Interval Oxygen   Interval Oxygen? Yes     Baseline Oxygen Saturation % 94 %     1 Minute Oxygen Saturation % 90 %     1 Minute Liters of Oxygen 3 L     2 Minute Oxygen Saturation % 87 %     2 Minute Liters of Oxygen 3 L     3 Minute Oxygen Saturation % 81 %     3 Minute Liters of Oxygen 3 L     4 Minute Oxygen Saturation % 83 %     4 Minute Liters of Oxygen 3 L     5 Minute Oxygen Saturation % 80 %     5 Minute Liters of Oxygen 3 L     6 Minute Oxygen Saturation % 81 %     6 Minute Liters of Oxygen 3 L     2 Minute Post Oxygen Saturation % 89 %     2 Minute Post Liters of Oxygen 3 L             Oxygen Initial Assessment:  Oxygen Initial Assessment - 03/28/23 1700       Home Oxygen   Home Oxygen Device Home Concentrator;Portable Concentrator    Sleep Oxygen Prescription Continuous    Liters per minute 3    Home Exercise Oxygen Prescription Continuous    Liters per minute 3    Home Resting Oxygen Prescription Continuous    Liters per minute 3    Compliance with Home Oxygen Use Yes      Initial 6 min Walk   Oxygen Used Continuous;E-Tanks    Liters per minute 3      Program Oxygen Prescription   Program Oxygen  Prescription Continuous;E-Tanks    Liters per minute 3      Intervention   Short Term Goals To learn and exhibit compliance with exercise, home and  travel O2 prescription;To learn and understand importance of monitoring SPO2 with pulse oximeter and demonstrate accurate use of the pulse oximeter.;To learn and understand importance of maintaining oxygen saturations>88%;To learn and demonstrate proper pursed lip breathing techniques or other breathing techniques. ;To learn and demonstrate proper use of respiratory medications    Long  Term Goals Exhibits compliance with exercise, home  and travel O2 prescription;Verbalizes importance of monitoring SPO2 with pulse oximeter and return demonstration;Maintenance of O2 saturations>88%;Exhibits proper breathing techniques, such as pursed lip breathing or other method taught during program session;Compliance with respiratory medication;Demonstrates proper use of MDI's             Oxygen Re-Evaluation:  Oxygen Re-Evaluation     Row Name 04/13/23 1352 04/25/23 1346 05/23/23 1353         Program Oxygen Prescription   Program Oxygen Prescription -- Continuous;E-Tanks Continuous;E-Tanks     Liters per minute -- 3  4 liters if needed 4       Home Oxygen   Home Oxygen Device -- Home Concentrator;Portable Concentrator Home Concentrator;Portable Concentrator     Sleep Oxygen Prescription -- Continuous Continuous     Liters per minute -- 3 3     Home Exercise Oxygen Prescription -- Continuous Continuous     Liters per minute -- 3 3     Home Resting Oxygen Prescription -- Continuous Continuous     Liters per minute -- 3 3     Compliance with Home Oxygen Use -- Yes Yes       Goals/Expected Outcomes   Short Term Goals -- To learn and demonstrate proper pursed lip breathing techniques or other breathing techniques.  To learn and understand importance of maintaining oxygen saturations>88%;To learn and understand importance of monitoring SPO2 with pulse  oximeter and demonstrate accurate use of the pulse oximeter.     Long  Term Goals -- Exhibits proper breathing techniques, such as pursed lip breathing or other method taught during program session Verbalizes importance of monitoring SPO2 with pulse oximeter and return demonstration;Maintenance of O2 saturations>88%     Comments Reviewed PLB technique with pt.  Talked about how it works and it's importance in maintaining their exercise saturations. Informed patient how to perform the Pursed Lipped breathing technique. Told patient to Inhale through the nose and out the mouth with pursed lips to keep their airways open, help oxygenate them better, practice when at rest or doing strenuous activity. Patient Verbalizes understanding of technique and will work on and be reiterated during [facility]. Patient has a pulse oximeter to check oxygen saturation at home. Informed and explained to patient why it is important to check oxygen at rest and on exertion at home. Informed patient that they should be 88 percent and above with their oxygen readings and that it is important to have levels above 88 to prevent damage to tissues in the body. Patient verbalizes understanding.     Goals/Expected Outcomes Short: Become more profiecient at using PLB.   Long: Become independent at using PLB. Short: use PLB with exertion. Long: use PLB on exertion proficiently and independently. Short: monitor oxygen at rest and with exertion at home. Long: maintain oxygen saturations above 88 percent independently.              Oxygen Discharge (Final Oxygen Re-Evaluation):  Oxygen Re-Evaluation - 05/23/23 1353       Program Oxygen Prescription   Program Oxygen Prescription Continuous;E-Tanks    Liters per minute 4  Home Oxygen   Home Oxygen Device Home Concentrator;Portable Concentrator    Sleep Oxygen Prescription Continuous    Liters per minute 3    Home Exercise Oxygen Prescription Continuous    Liters per minute 3     Home Resting Oxygen Prescription Continuous    Liters per minute 3    Compliance with Home Oxygen Use Yes      Goals/Expected Outcomes   Short Term Goals To learn and understand importance of maintaining oxygen saturations>88%;To learn and understand importance of monitoring SPO2 with pulse oximeter and demonstrate accurate use of the pulse oximeter.    Long  Term Goals Verbalizes importance of monitoring SPO2 with pulse oximeter and return demonstration;Maintenance of O2 saturations>88%    Comments Patient has a pulse oximeter to check oxygen saturation at home. Informed and explained to patient why it is important to check oxygen at rest and on exertion at home. Informed patient that they should be 88 percent and above with their oxygen readings and that it is important to have levels above 88 to prevent damage to tissues in the body. Patient verbalizes understanding.    Goals/Expected Outcomes Short: monitor oxygen at rest and with exertion at home. Long: maintain oxygen saturations above 88 percent independently.             Initial Exercise Prescription:  Initial Exercise Prescription - 03/28/23 1600       Date of Initial Exercise RX and Referring Provider   Date 03/28/23    Referring Provider [name]      Oxygen   Oxygen Continuous    Liters 3    Maintain Oxygen Saturation 88% or higher      Treadmill   MPH 11.4    Grade 0    Minutes 15    METs 2.02      Recumbant Bike   Level 1    RPM 50    Watts 15    Minutes 15      NuStep   Level 1    SPM 80    Minutes 15    METs 1.9      Arm Ergometer   Level 1    RPM 30    Minutes 15    METs 1.9      REL-XR   Level 1    Speed 50    Minutes 15    METs 1.9      Track   Laps 20    Minutes 15    METs 2.09      Prescription Details   Frequency (times per week) 3    Duration Progress to 30 minutes of continuous aerobic without signs/symptoms of physical distress      Intensity   THRR 40-80% of Max  Heartrate 112-130    Ratings of Perceived Exertion 11-13    Perceived Dyspnea 0-4      Progression   Progression Continue to progress workloads to maintain intensity without signs/symptoms of physical distress.      Resistance Training   Training Prescription Yes    Weight 5    Reps 10-15             Perform Capillary Blood Glucose checks as needed.  Exercise Prescription Changes:   Exercise Prescription Changes     Row Name 03/28/23 1600 04/14/23 1600 04/25/23 1400 05/09/23 1400 05/24/23 1500     Response to Exercise   Blood Pressure (Admit) 122/60 134/66 140/68 128/70 104/60   Blood Pressure (  Exercise) 148/64 162/70 178/82 134/60 --   Blood Pressure (Exit) 126/80 134/68 112/64 126/72 98/58   Heart Rate (Admit) 87 bpm 99 bpm 88 bpm 99 bpm 103 bpm   Heart Rate (Exercise) 116 bpm 127 bpm 132 bpm 111 bpm 116 bpm   Heart Rate (Exit) 93 bpm 80 bpm 114 bpm 105 bpm 107 bpm   Oxygen Saturation (Admit) 94 % 93 % 94 % 94 % 93 %   Oxygen Saturation (Exercise) 80 % 86 % 88 % 88 % 91 %   Oxygen Saturation (Exit) 95 % 93 % 93 % 94 % 95 %   Rating of Perceived Exertion (Exercise) 13 12 13 13 12    Perceived Dyspnea (Exercise) 3 2 3 3 3    Symptoms none none none SOB SOB   Comments 6 MWT resutls First two sessions of rehab -- -- --   Duration -- Progress to 30 minutes of  aerobic without signs/symptoms of physical distress Progress to 30 minutes of  aerobic without signs/symptoms of physical distress Continue with 30 min of aerobic exercise without signs/symptoms of physical distress. Continue with 30 min of aerobic exercise without signs/symptoms of physical distress.   Intensity -- THRR unchanged THRR unchanged THRR unchanged THRR unchanged     Progression   Progression -- Continue to progress workloads to maintain intensity without signs/symptoms of physical distress. Continue to progress workloads to maintain intensity without signs/symptoms of physical distress. Continue to progress  workloads to maintain intensity without signs/symptoms of physical distress. Continue to progress workloads to maintain intensity without signs/symptoms of physical distress.   Average METs -- 3.5 2.94 2.68 2.27     Resistance Training   Training Prescription -- Yes Yes Yes Yes   Weight -- 5 lb 5 lb 5 lb 5 lb   Reps -- 10-15 10-15 10-15 10-15     Interval Training   Interval Training -- No No No No     Oxygen   Oxygen -- Continuous Continuous Continuous Continuous   Liters -- 3 3 3-4 4     Treadmill   MPH -- -- 1.4 1.4 --   Grade -- -- 0 0 --   Minutes -- -- 15 15 --   METs -- -- 2.07 2.07 --     Recumbant Bike   Level -- 3 3 3  2.5   Watts -- 15 15 26 15    Minutes -- 15 15 15 15    METs -- -- -- 2.54 2.53     NuStep   Level -- 4 4 3 3    Minutes -- 30 15 15 15    METs -- 3.4 3.5 3.5 --     REL-XR   Level -- -- -- -- 1   Minutes -- -- -- -- 15   METs -- -- -- -- 2     T5 Nustep   Level -- -- -- 4 --   Minutes -- -- -- 15 --     Oxygen   Maintain Oxygen Saturation -- 88% or higher 88% or higher 88% or higher 88% or higher    Row Name 06/08/23 1600             Response to Exercise   Blood Pressure (Admit) 100/62       Blood Pressure (Exit) 110/68       Heart Rate (Admit) 97 bpm       Heart Rate (Exercise) 116 bpm       Heart Rate (Exit) 115  bpm       Oxygen Saturation (Admit) 93 %       Oxygen Saturation (Exercise) 88 %       Oxygen Saturation (Exit) 93 %       Rating of Perceived Exertion (Exercise) 12       Perceived Dyspnea (Exercise) 3       Symptoms SOB       Duration Continue with 30 min of aerobic exercise without signs/symptoms of physical distress.       Intensity THRR unchanged         Progression   Progression Continue to progress workloads to maintain intensity without signs/symptoms of physical distress.       Average METs 2.81         Resistance Training   Training Prescription Yes       Weight 5 lb       Reps 10-15         Interval  Training   Interval Training No         Oxygen   Oxygen Continuous       Liters 4         Treadmill   MPH 1.5       Grade 0       Minutes 15       METs 2.15         Recumbant Bike   Level 5       Watts 40       Minutes 15         Arm Ergometer   Level 2.5       Minutes 15       METs 2.1         REL-XR   Level 1       Minutes 15       METs 3.4         T5 Nustep   Level 5       Minutes 15       METs 2.4         Oxygen   Maintain Oxygen Saturation 88% or higher                Exercise Comments:   Exercise Comments     Row Name 04/13/23 1350           Exercise Comments First full day of exercise!  Patient was oriented to gym and equipment including functions, settings, policies, and procedures.  Patient's individual exercise prescription and treatment plan were reviewed.  All starting workloads were established based on the results of the 6 minute walk test done at initial orientation visit.  The plan for exercise progression was also introduced and progression will be customized based on patient's performance and goals.                Exercise Goals and Review:   Exercise Goals     Row Name 03/28/23 1656             Exercise Goals   Increase Physical Activity Yes       Intervention Provide advice, education, support and counseling about physical activity/exercise needs.;Develop an individualized exercise prescription for aerobic and resistive training based on initial evaluation findings, risk stratification, comorbidities and participant's personal goals.       Expected Outcomes Short Term: Attend rehab on a regular basis to increase amount of physical activity.;Long Term: Add in home exercise to make exercise part of routine  and to increase amount of physical activity.;Long Term: Exercising regularly at least 3-5 days a week.       Increase Strength and Stamina Yes       Intervention Provide advice, education, support and counseling about  physical activity/exercise needs.;Develop an individualized exercise prescription for aerobic and resistive training based on initial evaluation findings, risk stratification, comorbidities and participant's personal goals.       Expected Outcomes Short Term: Increase workloads from initial exercise prescription for resistance, speed, and METs.;Short Term: Perform resistance training exercises routinely during rehab and add in resistance training at home;Long Term: Improve cardiorespiratory fitness, muscular endurance and strength as measured by increased METs and functional capacity ( )       Able to understand and use rate of perceived exertion (RPE) scale Yes       Intervention Provide education and explanation on how to use RPE scale       Expected Outcomes Short Term: Able to use RPE daily in rehab to express subjective intensity level       Able to understand and use Dyspnea scale Yes       Intervention Provide education and explanation on how to use Dyspnea scale       Expected Outcomes Short Term: Able to use Dyspnea scale daily in rehab to express subjective sense of shortness of breath during exertion;Long Term: Able to use Dyspnea scale to guide intensity level when exercising independently       Knowledge and understanding of Target Heart Rate Range (THRR) Yes       Intervention Provide education and explanation of THRR including how the numbers were predicted and where they are located for reference       Expected Outcomes Short Term: Able to state/look up THRR;Long Term: Able to use THRR to govern intensity when exercising independently;Short Term: Able to use daily as guideline for intensity in rehab       Able to check pulse independently Yes       Intervention Provide education and demonstration on how to check pulse in carotid and radial arteries.;Review the importance of being able to check your own pulse for safety during independent exercise       Expected Outcomes Short Term:  Able to explain why pulse checking is important during independent exercise;Long Term: Able to check pulse independently and accurately       Understanding of Exercise Prescription Yes       Intervention Provide education, explanation, and written materials on patient's individual exercise prescription       Expected Outcomes Short Term: Able to explain program exercise prescription;Long Term: Able to explain home exercise prescription to exercise independently                Exercise Goals Re-Evaluation :  Exercise Goals Re-Evaluation     Row Name 04/13/23 1351 04/14/23 1643 04/25/23 1441 05/09/23 1406 05/23/23 1354     Exercise Goal Re-Evaluation   Exercise Goals Review Increase Physical Activity;Able to understand and use rate of perceived exertion (RPE) scale;Knowledge and understanding of Target Heart Rate Range (THRR);Understanding of Exercise Prescription;Able to check pulse independently;Able to understand and use Dyspnea scale;Increase Strength and Stamina Increase Physical Activity;Understanding of Exercise Prescription;Increase Strength and Stamina Increase Physical Activity;Understanding of Exercise Prescription;Increase Strength and Stamina Increase Physical Activity;Understanding of Exercise Prescription;Increase Strength and Stamina Increase Physical Activity;Increase Strength and Stamina   Comments Reviewed RPE scale, THR and program prescription with pt today.  Pt voiced understanding and was  given a copy of goals to take home. Harshil is off to a good start in rehab. He had an average MET level of 3.5 METs during his first two sessions. He also was able to work up to level 4 on the T4 nustep and level 3 on the recumbent bike. He has done well with 5 lb hand weights for resistance training as well. We will continue to monitor his progress. Bethann Berkshire is doing well in rehab. He recently began walking on the treadmill at a speed of 1.4 mph with no incline. He also has continued to work  at level 4 on the T4 nustep and level 3 on the recumbent bike. He has done well with 5 lb hand weights for resistance training as well. We will continue to monitor his progress in the program. Andro continues to do well in rehab. He increased his watts on the recumbent bike to 26! He has been consistent at a 1.4 mph speed on the treadmill and could benefit from adding a small incline to his workload. He uses between 3-4 L of O2 during exercise. We will continue to monitor. Jaspar has been doing well in rehab.  He is doing nothing as of yet on their off days from rehab. He is feeling like their strength and stamina are at a standtill at the moment. He wants to get better and stronger but does not have the energy to do exercise at home yet.   Expected Outcomes Short: Use RPE daily to regulate intensity.  Long: Follow program prescription in THR. Short: Continue to follow current exercise prescription. Long: Continue to improve strength and stamina. Short: Continue to progressively increase treadmill workload. Long: Continue to improve strength and stamina. Short: Add small incline to treadmill Long: Continue to increase overall MET level and stamina Short: Continue to add in exercise at home on off days Long: Continue to improve strength and stamina    Row Name 05/24/23 1535 06/08/23 1613 06/21/23 1528         Exercise Goal Re-Evaluation   Exercise Goals Review Increase Physical Activity;Increase Strength and Stamina;Understanding of Exercise Prescription Increase Physical Activity;Increase Strength and Stamina;Understanding of Exercise Prescription Increase Physical Activity;Increase Strength and Stamina;Understanding of Exercise Prescription     Comments F…Lix is doing well in the program. He has only attended rehab twice since the last review and has not done any walking in the program during that time. He has kept his workloads consistent on seated machines at level 1 on the XR, level 2.5 on the  recumbent bike, and level 3 on the T4 nustep. He also continues to do well with 5 lb hand weights for resistance training. We will continue to monitor his progress. Deontra is doing well in the program. He recently increased his overall average MET level to 2.81 METs. He also increased his treadmill workload to a speed of 1.5 mph with no incline, and improved to level 5 on both the recumbent bike and T5 nustep. We will continue to monitor his progress in the program. Exzavier has been placed on a medical hold until August. We will continue to monitor his progress once he returns to the program.     Expected Outcomes Short: Return to walking the treadmill in rehab. Long: Continue to increase overall average METs and stamina. Short: Try level 2 on the XR. Long: Continue to improve strength and stamina. Short: Return to rehab when appropriate. Long: Continue to improve stamina.  Discharge Exercise Prescription (Final Exercise Prescription Changes):  Exercise Prescription Changes - 06/08/23 1600       Response to Exercise   Blood Pressure (Admit) 100/62    Blood Pressure (Exit) 110/68    Heart Rate (Admit) 97 bpm    Heart Rate (Exercise) 116 bpm    Heart Rate (Exit) 115 bpm    Oxygen Saturation (Admit) 93 %    Oxygen Saturation (Exercise) 88 %    Oxygen Saturation (Exit) 93 %    Rating of Perceived Exertion (Exercise) 12    Perceived Dyspnea (Exercise) 3    Symptoms SOB    Duration Continue with 30 min of aerobic exercise without signs/symptoms of physical distress.    Intensity THRR unchanged      Progression   Progression Continue to progress workloads to maintain intensity without signs/symptoms of physical distress.    Average METs 2.81      Resistance Training   Training Prescription Yes    Weight 5 lb    Reps 10-15      Interval Training   Interval Training No      Oxygen   Oxygen Continuous    Liters 4      Treadmill   MPH 1.5    Grade 0    Minutes 15     METs 2.15      Recumbant Bike   Level 5    Watts 40    Minutes 15      Arm Ergometer   Level 2.5    Minutes 15    METs 2.1      REL-XR   Level 1    Minutes 15    METs 3.4      T5 Nustep   Level 5    Minutes 15    METs 2.4      Oxygen   Maintain Oxygen Saturation 88% or higher             Nutrition:  Target Goals: Understanding of nutrition guidelines, daily intake of sodium 1500mg , cholesterol 200mg , calories 30% from fat and 7% or less from saturated fats, daily to have 5 or more servings of fruits and vegetables.  Education: All About Nutrition: -Group instruction provided by verbal, written material, interactive activities, discussions, models, and posters to present general guidelines for heart healthy nutrition including fat, fiber, MyPlate, the role of sodium in heart healthy nutrition, utilization of the nutrition label, and utilization of this knowledge for meal planning. Follow up email sent as well. Written material given at graduation. Flowsheet Row Pulmonary Rehab from 05/04/2023 in [facility] Cardiac and Pulmonary Rehab  Education need identified 03/28/23       Biometrics:  Pre Biometrics - 03/28/23 1657       Pre Biometrics   Height 5\' 4"  (1.626 m)    Weight 200 lb 9.6 oz (91 kg)    Waist Circumference 45 inches    Hip Circumference 43 inches    Waist to Hip Ratio 1.05 %    BMI (Calculated) 34.42    Single Leg Stand 8.06 seconds              Nutrition Therapy Plan and Nutrition Goals:  Nutrition Therapy & Goals - 03/28/23 1818       Intervention Plan   Intervention Prescribe, educate and counsel regarding individualized specific dietary modifications aiming towards targeted core components such as weight, hypertension, lipid management, diabetes, heart failure and other comorbidities.    Expected Outcomes Short  Term Goal: Understand basic principles of dietary content, such as calories, fat, sodium, cholesterol and nutrients.;Short Term  Goal: A plan has been developed with personal nutrition goals set during dietitian appointment.;Long Term Goal: Adherence to prescribed nutrition plan.             Nutrition Assessments:  MEDIFICTS Score Key: ?70 Need to make dietary changes  40-70 Heart Healthy Diet ? 40 Therapeutic Level Cholesterol Diet  Flowsheet Row Pulmonary Rehab from 03/28/2023 in [facility] Cardiac and Pulmonary Rehab  Picture Your Plate Total Score on Admission 53      Picture Your Plate Scores: <91 Unhealthy dietary pattern with much room for improvement. 41-50 Dietary pattern unlikely to meet recommendations for good health and room for improvement. 51-60 More healthful dietary pattern, with some room for improvement.  >60 Healthy dietary pattern, although there may be some specific behaviors that could be improved.   Nutrition Goals Re-Evaluation:  Nutrition Goals Re-Evaluation     Row Name 04/25/23 1348             Goals   Current Weight 193 lb (87.5 kg)       Comment Patient was informed on why it is important to maintain a balanced diet when dealing with Respiratory issues. Explained that it takes a lot of energy to breath and when they are short of breath often they will need to have a good diet to help keep up with the calories they are expending for breathing.       Expected Outcome Short: Choose and plan snacks accordingly to patients caloric intake to improve breathing. Long: Maintain a diet independently that meets their caloric intake to aid in daily shortness of breath.                Nutrition Goals Discharge (Final Nutrition Goals Re-Evaluation):  Nutrition Goals Re-Evaluation - 04/25/23 1348       Goals   Current Weight 193 lb (87.5 kg)    Comment Patient was informed on why it is important to maintain a balanced diet when dealing with Respiratory issues. Explained that it takes a lot of energy to breath and when they are short of breath often they will need to have a good diet  to help keep up with the calories they are expending for breathing.    Expected Outcome Short: Choose and plan snacks accordingly to patients caloric intake to improve breathing. Long: Maintain a diet independently that meets their caloric intake to aid in daily shortness of breath.             Psychosocial: Target Goals: Acknowledge presence or absence of significant depression and/or stress, maximize coping skills, provide positive support system. Participant is able to verbalize types and ability to use techniques and skills needed for reducing stress and depression.   Education: Stress, Anxiety, and Depression - Group verbal and visual presentation to define topics covered.  Reviews how body is impacted by stress, anxiety, and depression.  Also discusses healthy ways to reduce stress and to treat/manage anxiety and depression.  Written material given at graduation. Flowsheet Row Pulmonary Rehab from 03/31/2018 in [facility] Cardiac and Pulmonary Rehab  Date 02/08/18  Educator [facility]  Instruction Review Code 1- [employer] Understanding       Education: Sleep Hygiene -Provides group verbal and written instruction about how sleep can affect your health.  Define sleep hygiene, discuss sleep cycles and impact of sleep habits. Review good sleep hygiene tips.    Initial Review &  Psychosocial Screening:  Initial Psych Review & Screening - 03/21/23 1116       Initial Review   Current issues with Current Psychotropic Meds;Current Depression;Current Anxiety/Panic      Family Dynamics   Good Support System? Yes   daughters  live close, brother in law takes him to appts     Barriers   Psychosocial barriers to participate in program There are no identifiable barriers or psychosocial needs.      Screening Interventions   Interventions Encouraged to exercise;To provide support and resources with identified psychosocial needs;Provide feedback about the scores to participant    Expected Outcomes  Short Term goal: Utilizing psychosocial counselor, staff and physician to assist with identification of specific Stressors or current issues interfering with healing process. Setting desired goal for each stressor or current issue identified.;Long Term Goal: Stressors or current issues are controlled or eliminated.;Short Term goal: Identification and review with participant of any Quality of Life or Depression concerns found by scoring the questionnaire.;Long Term goal: The participant improves quality of Life and PHQ9 Scores as seen by post scores and/or verbalization of changes             Quality of Life Scores:  Scores of 19 and below usually indicate a poorer quality of life in these areas.  A difference of  2-3 points is a clinically meaningful difference.  A difference of 2-3 points in the total score of the Quality of Life Index has been associated with significant improvement in overall quality of life, self-image, physical symptoms, and general health in studies assessing change in quality of life.  PHQ-9: Review Flowsheet  More data exists      05/23/2023 04/25/2023 03/28/2023 07/07/2018 05/22/2018  Depression screen PHQ 2/9  Decreased Interest 2 2 3 2 2   Down, Depressed, Hopeless 2 3 3 2 2   PHQ - 2 Score 4 5 6 4 4   Altered sleeping 2 2 2 2 1   Tired, decreased energy 2 2 2 2 1   Change in appetite 1 2 2 2 1   Feeling bad or failure about yourself  2 3 2 2 1   Trouble concentrating 2 2 2  0 0  Moving slowly or fidgety/restless 2 2 2  0 0  Suicidal thoughts 0 0 0 0 0  PHQ-9 Score 15 18 18 12 8   Difficult doing work/chores Somewhat difficult Somewhat difficult Somewhat difficult - -   Interpretation of Total Score  Total Score Depression Severity:  1-4 = Minimal depression, 5-9 = Mild depression, 10-14 = Moderate depression, 15-19 = Moderately severe depression, 20-27 = Severe depression   Psychosocial Evaluation and Intervention:  Psychosocial Evaluation - 03/21/23 1125        Psychosocial Evaluation & Interventions   Comments Khyden has no barriers to attending the program. He lives alone. HIs daughter and brother in law are his support. He has history of depression/anxiety and is on medications managed by his primary physician. He wants to see if he can improve his breathing and be able to perform activities longer than he is at this time. He does wear oxygen all day and night and is compliant. He is ready to return to the program. He was here in 2019.    He is still greiving the loss of his wife in 2019;they had been married 40 years.    Expected Outcomes STG Nicklas will attend all scheduled sessions, he will continue to work on controlling his depression/anxiety symptoms. He will follow the exercise progression to  work on improving his breathing. LTG Decorian maintiais control of his depression/anxiety symptoms. He is able to be a bit more active without increased shortness of breath.    Continue Psychosocial Services  Follow up required by staff             Psychosocial Re-Evaluation:  Psychosocial Re-Evaluation     Row Name 04/25/23 1353 05/23/23 1359           Psychosocial Re-Evaluation   Current issues with Current Anxiety/Panic;History of Depression;Current Depression;Current Psychotropic Meds Current Anxiety/Panic;History of Depression;Current Depression;Current Psychotropic Meds      Comments Reviewed patient health questionnaire (PHQ-9) with patient for follow up. Previously, patients score indicated signs/symptoms of depression.  Reviewed to see if patient is improving symptom wise while in program.  Score stayed the same and patient states that it is because he has some depression. He is going to meet with his doctor about his depression. Reviewed patient health questionnaire (PHQ-9) with patient for follow up. Previously, patients score indicated signs/symptoms of depression.  Reviewed to see if patient is improving symptom wise while in program.   Score improved and patient states that it is because he has  the motivation to get better.      Expected Outcomes Short: Continue to work toward an improvement in PHQ9 scores by attending [facility] regularly. Long: Continue to improve stress and depression coping skills by talking with staff and attending [facility] regularly and work toward a positive mental state. Short: Continue to attend [facility] regularly for regular exercise and social engagement. Long: Continue to improve symptoms and manage a positive mental state.      Interventions -- Encouraged to attend Pulmonary Rehabilitation for the exercise      Continue Psychosocial Services  Follow up required by staff Follow up required by staff               Psychosocial Discharge (Final Psychosocial Re-Evaluation):  Psychosocial Re-Evaluation - 05/23/23 1359       Psychosocial Re-Evaluation   Current issues with Current Anxiety/Panic;History of Depression;Current Depression;Current Psychotropic Meds    Comments Reviewed patient health questionnaire (PHQ-9) with patient for follow up. Previously, patients score indicated signs/symptoms of depression.  Reviewed to see if patient is improving symptom wise while in program.  Score improved and patient states that it is because he has  the motivation to get better.    Expected Outcomes Short: Continue to attend [facility] regularly for regular exercise and social engagement. Long: Continue to improve symptoms and manage a positive mental state.    Interventions Encouraged to attend Pulmonary Rehabilitation for the exercise    Continue Psychosocial Services  Follow up required by staff             Education: Education Goals: Education classes will be provided on a weekly basis, covering required topics. Participant will state understanding/return demonstration of topics presented.  Learning Barriers/Preferences:   General Pulmonary Education Topics:  Infection  Prevention: - Provides verbal and written material to individual with discussion of infection control including proper hand washing and proper equipment cleaning during exercise session. Flowsheet Row Pulmonary Rehab from 05/04/2023 in [facility] Cardiac and Pulmonary Rehab  Date 03/28/23  Educator [facility]  Instruction Review Code 1- Verbalizes Understanding       Falls Prevention: - Provides verbal and written material to individual with discussion of falls prevention and safety. Flowsheet Row Pulmonary Rehab from 05/04/2023 in [facility] Cardiac and Pulmonary Rehab  Date 03/28/23  Educator [facility]  Instruction  Review Code 5- Refused Teaching       Chronic Lung Disease Review: - Group verbal instruction with posters, models, PowerPoint presentations and videos,  to review new updates, new respiratory medications, new advancements in procedures and treatments. Providing information on websites and "800" numbers for continued self-education. Includes information about supplement oxygen, available portable oxygen systems, continuous and intermittent flow rates, oxygen safety, concentrators, and Medicare reimbursement for oxygen. Explanation of Pulmonary Drugs, including class, frequency, complications, importance of spacers, rinsing mouth after steroid MDI's, and proper cleaning methods for nebulizers. Review of basic lung anatomy and physiology related to function, structure, and complications of lung disease. Review of risk factors. Discussion about methods for diagnosing sleep apnea and types of masks and machines for OSA. Includes a review of the use of types of environmental controls: home humidity, furnaces, filters, dust mite/pet prevention, HEPA vacuums. Discussion about weather changes, air quality and the benefits of nasal washing. Instruction on Warning signs, infection symptoms, calling MD promptly, preventive modes, and value of vaccinations. Review of effective airway clearance, coughing and/or vibration  techniques. Emphasizing that all should Create an Action Plan. Written material given at graduation. Flowsheet Row Pulmonary Rehab from 05/04/2023 in [facility] Cardiac and Pulmonary Rehab  Education need identified 03/28/23  Date 05/04/23  Educator [facility]  Instruction Review Code 1- Verbalizes Understanding       AED/CPR: - Group verbal and written instruction with the use of models to demonstrate the basic use of the AED with the basic ABC's of resuscitation. Flowsheet Row Pulmonary Rehab from 03/31/2018 in [facility] Cardiac and Pulmonary Rehab  Date 02/17/18  Educator [facility]  Instruction Review Code 1- Verbalizes Understanding        Anatomy and Cardiac Procedures: - Group verbal and visual presentation and models provide information about basic cardiac anatomy and function. Reviews the testing methods done to diagnose heart disease and the outcomes of the test results. Describes the treatment choices: Medical Management, Angioplasty, or Coronary Bypass Surgery for treating various heart conditions including Myocardial Infarction, Angina, Valve Disease, and Cardiac Arrhythmias.  Written material given at graduation.   Medication Safety: - Group verbal and visual instruction to review commonly prescribed medications for heart and lung disease. Reviews the medication, class of the drug, and side effects. Includes the steps to properly store meds and maintain the prescription regimen.  Written material given at graduation. Flowsheet Row Pulmonary Rehab from 03/31/2018 in [facility] Cardiac and Pulmonary Rehab  Date 02/03/18  Educator [facility]  Instruction Review Code 1- Verbalizes Understanding       Other: -Provides group and verbal instruction on various topics (see comments)   Knowledge Questionnaire Score:  Knowledge Questionnaire Score - 03/28/23 1702       Knowledge Questionnaire Score   Pre Score 12/18              Core Components/Risk Factors/Patient Goals at Admission:  Personal Goals and  Risk Factors at Admission - 03/28/23 1657       Core Components/Risk Factors/Patient Goals on Admission    Weight Management Yes    Intervention Weight Management: Develop a combined nutrition and exercise program designed to reach desired caloric intake, while maintaining appropriate intake of nutrient and fiber, sodium and fats, and appropriate energy expenditure required for the weight goal.;Weight Management: Provide education and appropriate resources to help participant work on and attain dietary goals.;Weight Management/Obesity: Establish reasonable short term and long term weight goals.    Admit Weight 200 lb 9.6 oz (91 kg)  Goal Weight: Short Term 190 lb (86.2 kg)    Goal Weight: Long Term 175 lb (79.4 kg)    Expected Outcomes Short Term: Continue to assess and modify interventions until short term weight is achieved;Long Term: Adherence to nutrition and physical activity/exercise program aimed toward attainment of established weight goal;Weight Loss: Understanding of general recommendations for a balanced deficit meal plan, which promotes 1-2 lb weight loss per week and includes a negative energy balance of [phone] kcal/d;Understanding recommendations for meals to include 15-35% energy as protein, 25-35% energy from fat, 35-60% energy from carbohydrates, less than 200mg  of dietary cholesterol, 20-35 gm of total fiber daily;Understanding of distribution of calorie intake throughout the day with the consumption of 4-5 meals/snacks    Improve shortness of breath with ADL's Yes    Intervention Provide education, individualized exercise plan and daily activity instruction to help decrease symptoms of SOB with activities of daily living.    Expected Outcomes Short Term: Improve cardiorespiratory fitness to achieve a reduction of symptoms when performing ADLs;Long Term: Be able to perform more ADLs without symptoms or delay the onset of symptoms    Increase knowledge of respiratory medications and  ability to use respiratory devices properly  Yes    Intervention Provide education and demonstration as needed of appropriate use of medications, inhalers, and oxygen therapy.    Expected Outcomes Short Term: Achieves understanding of medications use. Understands that oxygen is a medication prescribed by physician. Demonstrates appropriate use of inhaler and oxygen therapy.;Long Term: Maintain appropriate use of medications, inhalers, and oxygen therapy.    Diabetes Yes   no meds, getting A1C checked soon to confirm proper diagnosis   Intervention Provide education about signs/symptoms and action to take for hypo/hyperglycemia.;Provide education about proper nutrition, including hydration, and aerobic/resistive exercise prescription along with prescribed medications to achieve blood glucose in normal ranges: Fasting glucose 65-99 mg/dL    Expected Outcomes Short Term: Participant verbalizes understanding of the signs/symptoms and immediate care of hyper/hypoglycemia, proper foot care and importance of medication, aerobic/resistive exercise and nutrition plan for blood glucose control.;Long Term: Attainment of HbA1C < 7%.    Heart Failure Yes    Intervention Provide a combined exercise and nutrition program that is supplemented with education, support and counseling about heart failure. Directed toward relieving symptoms such as shortness of breath, decreased exercise tolerance, and extremity edema.    Expected Outcomes Improve functional capacity of life;Short term: Attendance in program 2-3 days a week with increased exercise capacity. Reported lower sodium intake. Reported increased fruit and vegetable intake. Reports medication compliance.;Short term: Daily weights obtained and reported for increase. Utilizing diuretic protocols set by physician.;Long term: Adoption of self-care skills and reduction of barriers for early signs and symptoms recognition and intervention leading to self-care maintenance.     Hypertension Yes    Intervention Provide education on lifestyle modifcations including regular physical activity/exercise, weight management, moderate sodium restriction and increased consumption of fresh fruit, vegetables, and low fat dairy, alcohol moderation, and smoking cessation.;Monitor prescription use compliance.    Expected Outcomes Short Term: Continued assessment and intervention until BP is < 140/32mm HG in hypertensive participants. < 130/64mm HG in hypertensive participants with diabetes, heart failure or chronic kidney disease.;Long Term: Maintenance of blood pressure at goal levels.    Lipids Yes    Intervention Provide education and support for participant on nutrition & aerobic/resistive exercise along with prescribed medications to achieve LDL 70mg , HDL >40mg .    Expected Outcomes Short Term: Participant states  understanding of desired cholesterol values and is compliant with medications prescribed. Participant is following exercise prescription and nutrition guidelines.;Long Term: Cholesterol controlled with medications as prescribed, with individualized exercise RX and with personalized nutrition plan. Value goals: LDL < 70mg , HDL > 40 mg.    Stress Yes    Intervention Offer individual and/or small group education and counseling on adjustment to heart disease, stress management and health-related lifestyle change. Teach and support self-help strategies.;Refer participants experiencing significant psychosocial distress to appropriate mental health specialists for further evaluation and treatment. When possible, include family members and significant others in education/counseling sessions.    Expected Outcomes Short Term: Participant demonstrates changes in health-related behavior, relaxation and other stress management skills, ability to obtain effective social support, and compliance with psychotropic medications if prescribed.;Long Term: Emotional wellbeing is indicated by absence of  clinically significant psychosocial distress or social isolation.             Education:Diabetes - Individual verbal and written instruction to review signs/symptoms of diabetes, desired ranges of glucose level fasting, after meals and with exercise. Acknowledge that pre and post exercise glucose checks will be done for 3 sessions at entry of program.   Know Your Numbers and Heart Failure: - Group verbal and visual instruction to discuss disease risk factors for cardiac and pulmonary disease and treatment options.  Reviews associated critical values for Overweight/Obesity, Hypertension, Cholesterol, and Diabetes.  Discusses basics of heart failure: signs/symptoms and treatments.  Introduces Heart Failure Zone chart for action plan for heart failure.  Written material given at graduation.   Core Components/Risk Factors/Patient Goals Review:   Goals and Risk Factor Review     Row Name 04/25/23 1347 05/23/23 1356           Core Components/Risk Factors/Patient Goals Review   Personal Goals Review Improve shortness of breath with ADL's Other      Review Spoke to patient about their shortness of breath and what they can do to improve. Patient has been informed of breathing techniques when starting the program. Patient is informed to tell staff if they have had any med changes and that certain meds they are taking or not taking can be causing shortness of breath. Spoke to patient about COPD Action Plan. Reviewed and talked with the patient about the different levels of COPD severity that they should review daily and the steps they can take to manage their COPD. Went over pertinent actions for the patient can take when they feel like they are having a COPD exacerbation and the necessary treatment if needed. If patient has any changes with their breathing or feel like they are in a different zone of severity to inform staff for re-evaluation. Patient verbalizes understanding. Copy given to patient.       Expected Outcomes Short: Attend [facility] regularly to improve shortness of breath with ADL's. Long: maintain independence with ADL's Short: Follow COPD action plan. Long: Report any changes and severity of COPD.               Core Components/Risk Factors/Patient Goals at Discharge (Final Review):   Goals and Risk Factor Review - 05/23/23 1356       Core Components/Risk Factors/Patient Goals Review   Personal Goals Review Other    Review Spoke to patient about COPD Action Plan. Reviewed and talked with the patient about the different levels of COPD severity that they should review daily and the steps they can take to manage their COPD. Went over pertinent  actions for the patient can take when they feel like they are having a COPD exacerbation and the necessary treatment if needed. If patient has any changes with their breathing or feel like they are in a different zone of severity to inform staff for re-evaluation. Patient verbalizes understanding. Copy given to patient.    Expected Outcomes Short: Follow COPD action plan. Long: Report any changes and severity of COPD.             ITP Comments:  ITP Comments     Row Name 03/21/23 1132 03/28/23 1643 03/29/23 1132 04/06/23 0904 04/13/23 1350   ITP Comments Virtual orientation call completed today. he has an appointment on Date: 03/28/2023  for EP eval and gym Orientation.  Documentation of diagnosis can be found in [facility] Date: 03/01/2023 . Completed and gym orientation. Initial ITP created and sent for review to Dr. Jinny Sanders, Medical Director. Patient called to let us know he is scheduled for a heart cath on 4/15, and has to delay his start date with pulmonary rehab with [place]. Patient will need clearance to return- patient aware and will call us to let us know when he is able to return with clearance. 30 day review completed. ITP sent to Dr. Jinny Sanders, Medical Director of  Pulmonary Rehab. Continue with ITP unless changes  are made by physician.  Delayed start, no exercise sessions yet. First full day of exercise!  Patient was oriented to gym and equipment including functions, settings, policies, and procedures.  Patient's individual exercise prescription and treatment plan were reviewed.  All starting workloads were established based on the results of the 6 minute walk test done at initial orientation visit.  The plan for exercise progression was also introduced and progression will be customized based on patient's performance and goals.    Row Name 05/04/23 0830 05/31/23 1101 06/01/23 0920 06/28/23 1419     ITP Comments 30 Day review completed. Medical Director ITP review done, changes made as directed, and signed approval by Medical Director.   new to program Jareese saw his pulmonologist today.  They would like to pull him on medical hold for rehab at this time.  They feel that his reflux and cancer treatment responses are hindering his breathing on top of the exercise and not letting him make a recovery.  They would like him to hold off until he is feeling better.  Doctor said hold until follow up in August.  Chaske wants to call her back in about 4-6 weeks to see how he is doing.  He was told to keep up updated and to call us to sign up for time slot prior to returning to rehab in August. 30 Day review completed. Medical Director ITP review done, changes made as directed, and signed approval by Medical Director.    on medical hold 30 Day review completed. Medical Director ITP review done, changes made as directed, and signed approval by Medical Director.             Comments:

## 2023-06-28 NOTE — Progress Notes (Signed)
Medication Samples have been provided to the patient.  Drug name: Sherryll Burger       Strength: 24/26 mg       Qty: 2  LOT: [id number]  Exp.Date: Aug 2025  Dosing instructions:1 tablet 2 times a day.  The patient has been instructed regarding the correct time, dose, and frequency of taking this medication, including desired effects and most common side effects.   Electa Sniff 12:10 PM 06/28/2023

## 2023-06-29 ENCOUNTER — Ambulatory Visit
Admission: RE | Admit: 2023-06-29 | Discharge: 2023-06-29 | Disposition: A | Discharge status: Home / Self Care | Payer: Medicare Other | Source: Ambulatory Visit | Attending: Radiation Oncology | Admitting: Radiation Oncology

## 2023-06-29 ENCOUNTER — Encounter: Payer: Self-pay | Admitting: Radiation Oncology

## 2023-06-29 VITALS — BP 128/70 | HR 70 | Temp 97.6°F | Resp 20 | Wt 187.0 lb

## 2023-06-29 DIAGNOSIS — C155 Malignant neoplasm of lower third of esophagus: Secondary | ICD-10-CM | POA: Insufficient documentation

## 2023-06-29 NOTE — Progress Notes (Signed)
Radiation Oncology Follow up Note  Name: Tryst[facility]   Date:   06/29/2023 MRN:  [id number] DOB: 1951/11/28    This 72 y.o. 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  He is asymptomatic at this time from an esophageal standpoint having nodes to specific dysphagia except for extremely hard food like uncooked hamburger.  He has had not upper endoscopy which was negative as well as a PET scan showing no evidence of residual disease in his esophagus.  His major problem at this time is.  Severe COPD chronic hypoxic respiratory failure and that is being followed by Dr. [name].  Most of his pulmonary symptoms Dr. [name] feels are related to his cardiac function.  COMPLICATIONS OF TREATMENT: none  FOLLOW UP COMPLIANCE: keeps appointments   PHYSICAL EXAM:  BP 128/70   Pulse 70   Temp 97.6 F (36.4 C) (Tympanic)   Resp 20   Wt 187 lb (84.8 kg)   BMI 26.83 kg/m  Well-developed well-nourished patient in NAD. HEENT reveals PERLA, EOMI, discs not visualized.  Oral cavity is clear. No oral mucosal lesions are identified. Neck is clear without evidence of cervical or supraclavicular adenopathy. Lungs are clear to A&P. Cardiac examination is essentially unremarkable with regular rate and rhythm without murmur rub or thrill. Abdomen is benign with no organomegaly or masses noted. Motor sensory and DTR levels are equal and symmetric in the upper and lower extremities. Cranial nerves II through XII are grossly intact. Proprioception is intact. No peripheral adenopathy or edema is identified. No motor or sensory levels are noted. Crude visual fields are within normal range.  RADIOLOGY RESULTS:    Follow-up in patient CT scan from March reviewed showing no evidence of recurrent esophageal cancer  PLAN: Present time from our standpoint patient is clinically doing well he has a CT scan scheduled in August which I will review when it becomes available.  He continues close follow-up care with cardiology and pulmonology.  Also continues follow-up care with Dr. Cathie Hoops in medical oncology.  Patient is to call with any concerns.  I would like to take this opportunity to thank you for allowing me to participate in the care of your patient.Carmina Miller, MD

## 2023-07-07 ENCOUNTER — Other Ambulatory Visit
Admission: RE | Admit: 2023-07-07 | Discharge: 2023-07-07 | Disposition: A | Discharge status: Home / Self Care | Payer: Medicare Other | Attending: Cardiology | Admitting: Cardiology

## 2023-07-07 DIAGNOSIS — I5032 Chronic diastolic (congestive) heart failure: Secondary | ICD-10-CM | POA: Diagnosis present

## 2023-07-07 LAB — LIPID PANEL
Cholesterol: 127 mg/dL (ref 0–200)
HDL: 46 mg/dL (ref >40)
LDL Cholesterol: 58 mg/dL (ref 0–99)
Total CHOL/HDL Ratio: 2.8 RATIO
Triglycerides: 114 mg/dL (ref <150)
VLDL: 23 mg/dL (ref 0–40)

## 2023-07-15 ENCOUNTER — Emergency Department
Admission: EM | Admit: 2023-07-15 | Discharge: 2023-07-15 | Disposition: A | Discharge status: Home / Self Care | Payer: Medicare Other | Attending: Student in an Organized Health Care Education/Training Program | Admitting: Student in an Organized Health Care Education/Training Program

## 2023-07-15 ENCOUNTER — Emergency Department: Payer: Medicare Other

## 2023-07-15 ENCOUNTER — Telehealth: Payer: Self-pay

## 2023-07-15 ENCOUNTER — Other Ambulatory Visit: Payer: Self-pay

## 2023-07-15 DIAGNOSIS — J40 Bronchitis, not specified as acute or chronic: Secondary | ICD-10-CM | POA: Insufficient documentation

## 2023-07-15 DIAGNOSIS — J449 Chronic obstructive pulmonary disease, unspecified: Secondary | ICD-10-CM | POA: Insufficient documentation

## 2023-07-15 DIAGNOSIS — R0602 Shortness of breath: Secondary | ICD-10-CM | POA: Diagnosis present

## 2023-07-15 LAB — COMPREHENSIVE METABOLIC PANEL
ALT: 26 U/L (ref 0–44)
AST: 21 U/L (ref 15–41)
Albumin: 3.6 g/dL (ref 3.5–5.0)
Alkaline Phosphatase: 62 U/L (ref 38–126)
Anion gap: 8 (ref 5–15)
BUN: 15 mg/dL (ref 8–23)
CO2: 29 mmol/L (ref 22–32)
Calcium: 8.7 mg/dL — ABNORMAL LOW (ref 8.9–10.3)
Chloride: 104 mmol/L (ref 98–111)
Creatinine, Ser: 0.85 mg/dL (ref 0.61–1.24)
GFR, Estimated: >60 mL/min (ref >60)
Glucose, Bld: 96 mg/dL (ref 70–99)
Potassium: 3.8 mmol/L (ref 3.5–5.1)
Sodium: 141 mmol/L (ref 135–145)
Total Bilirubin: 0.3 mg/dL (ref 0.3–1.2)
Total Protein: 6.1 g/dL — ABNORMAL LOW (ref 6.5–8.1)

## 2023-07-15 LAB — CBC WITH DIFFERENTIAL/PLATELET
Abs Immature Granulocytes: 0.02 10*3/uL (ref 0.00–0.07)
Basophils Absolute: 0 10*3/uL (ref 0.0–0.1)
Basophils Relative: 0 %
Eosinophils Absolute: 0.1 10*3/uL (ref 0.0–0.5)
Eosinophils Relative: 2 %
HCT: 41.2 % (ref 39.0–52.0)
Hemoglobin: 12.7 g/dL — ABNORMAL LOW (ref 13.0–17.0)
Immature Granulocytes: 0 %
Lymphocytes Relative: 11 %
Lymphs Abs: 0.7 10*3/uL (ref 0.7–4.0)
MCH: 28.3 pg (ref 26.0–34.0)
MCHC: 30.8 g/dL (ref 30.0–36.0)
MCV: 92 fL (ref 80.0–100.0)
Monocytes Absolute: 1 10*3/uL (ref 0.1–1.0)
Monocytes Relative: 17 %
Neutro Abs: 4 10*3/uL (ref 1.7–7.7)
Neutrophils Relative %: 70 %
Platelets: 228 10*3/uL (ref 150–400)
RBC: 4.48 MIL/uL (ref 4.22–5.81)
RDW: 12.9 % (ref 11.5–15.5)
WBC: 5.8 10*3/uL (ref 4.0–10.5)
nRBC: 0 % (ref 0.0–0.2)

## 2023-07-15 LAB — TROPONIN I (HIGH SENSITIVITY)
Troponin I (High Sensitivity): 8 ng/L (ref <18)
Troponin I (High Sensitivity): 7 ng/L (ref <18)

## 2023-07-15 MED ORDER — IPRATROPIUM-ALBUTEROL 0.5-2.5 (3) MG/3ML IN SOLN
3.0000 mL | Freq: Once | RESPIRATORY_TRACT | Status: AC
  Administered 2023-07-15: 3 mL via RESPIRATORY_TRACT
  Filled 2023-07-15: qty 3

## 2023-07-15 MED ORDER — AZITHROMYCIN 250 MG PO TABS: ORAL_TABLET | ORAL | 0 refills | Status: AC

## 2023-07-15 MED ORDER — PREDNISONE 20 MG PO TABS
60.0000 mg | ORAL_TABLET | Freq: Once | ORAL | Status: AC
  Administered 2023-07-15: 60 mg via ORAL
  Filled 2023-07-15: qty 3

## 2023-07-15 MED ORDER — PREDNISONE 20 MG PO TABS: 40.0000 mg | ORAL_TABLET | Freq: Every day | ORAL | 0 refills | Status: AC

## 2023-07-15 NOTE — ED Triage Notes (Signed)
Pt to ED via [facility] from home for c/o intermittent non-radiating chest pain since Monday rated at 6/10. Pt has hx of 2 stents, exploratory cath one month ago, and has a referral to CHF clinic. Pt has hx COPD, reports increased SOB since Monday, wears 3L O2 at baseline. Vital signs stable. 324 aspirin administered by EMS.

## 2023-07-15 NOTE — ED Provider Notes (Signed)
[facility] Provider Note    Event Date/Time   First MD Initiated Contact with Patient 07/15/23 1315     (approximate)   History   No chief complaint on file.   HPI  Leslie Sims is a 72 y.o. male with history of COPD presents to the ER for evaluation of intermittent chest discomfort and shortness of breath.  Symptoms been ongoing for several days now.  Denies any fevers.  Has had increasing cough feels like his COPD may be acting up.  Also admits that he is quite anxious and took anxiety medication this morning with improvement in symptoms.     Physical Exam   Triage Vital Signs: ED Triage Vitals  Encounter Vitals Group     BP 07/15/23 1331 (!) 110/98     Systolic BP Percentile --      Diastolic BP Percentile --      Pulse Rate 07/15/23 1331 93     Resp 07/15/23 1331 14     Temp 07/15/23 1331 98.3 F (36.8 C)     Temp Source 07/15/23 1331 Oral     SpO2 07/15/23 1331 100 %     Weight 07/15/23 1328 193 lb 3.2 oz (87.6 kg)     Height 07/15/23 1328 5\' 10"  (1.778 m)     Head Circumference --      Peak Flow --      Pain Score 07/15/23 1327 4     Pain Loc --      Pain Education --      Exclude from Growth Chart --     Most recent vital signs: Vitals:   07/15/23 1331  BP: (!) 110/98  Pulse: 93  Resp: 14  Temp: 98.3 F (36.8 C)  SpO2: 100%     Constitutional: Alert  Eyes: Conjunctivae are normal.  Head: Atraumatic. Nose: No congestion/rhinnorhea. Mouth/Throat: Mucous membranes are moist.   Neck: Painless ROM.  Cardiovascular:   Good peripheral circulation. Respiratory: No tachypnea but expiratory wheezes throughout. Gastrointestinal: Soft and nontender.  Musculoskeletal:  no deformity Neurologic:  MAE spontaneously. No gross focal neurologic deficits are appreciated.  Skin:  Skin is warm, dry and intact. No rash noted. Psychiatric: Mood and affect are normal. Speech and behavior are normal.    ED Results / Procedures /  Treatments   Labs (all labs ordered are listed, but only abnormal results are displayed) Labs Reviewed  CBC WITH DIFFERENTIAL/PLATELET - Abnormal; Notable for the following components:      Result Value   Hemoglobin 12.7 (*)    All other components within normal limits  COMPREHENSIVE METABOLIC PANEL - Abnormal; Notable for the following components:   Calcium 8.7 (*)    Total Protein 6.1 (*)    All other components within normal limits  TROPONIN I (HIGH SENSITIVITY)  TROPONIN I (HIGH SENSITIVITY)  TROPONIN I (HIGH SENSITIVITY)     EKG  ED ECG REPORT I, Willy Eddy, the attending physician, personally viewed and interpreted this ECG.   Date: 07/15/2023  EKG Time: 13:34  Rate: 90  Rhythm: sinus  Axis: normal  Intervals: nomal  ST&T Change: no stemi, nonspecific st abn    RADIOLOGY Please see ED Course for my review and interpretation.  I personally reviewed all radiographic images ordered to evaluate for the above acute complaints and reviewed radiology reports and findings.  These findings were personally discussed with the patient.  Please see medical record for radiology report.    PROCEDURES:  Critical Care performed: No  Procedures   MEDICATIONS ORDERED IN ED: Medications  predniSONE (DELTASONE) tablet 60 mg (has no administration in time range)  ipratropium-albuterol (DUONEB) 0.5-2.5 (3) MG/3ML nebulizer solution 3 mL (3 mLs Nebulization Given 07/15/23 1402)     IMPRESSION / MDM / ASSESSMENT AND PLAN / ED COURSE  I reviewed the triage vital signs and the nursing notes.                              Differential diagnosis includes, but is not limited to, Asthma, copd, CHF, pna, ptx, malignancy, Pe, anemia   Patient presenting to the ER for evaluation of symptoms as described above.  Based on symptoms, risk factors and considered above differential, this presenting complaint could reflect a potentially life-threatening illness therefore the patient  will be placed on continuous pulse oximetry and telemetry for monitoring.  Laboratory evaluation will be sent to evaluate for the above complaints.      Clinical Course as of 07/15/23 1524  Fri Jul 15, 2023  1522 Patient feels significantly improved after nebulizer treatment.  I have a lower suspicion for ACS.  Will plan repeat troponin if negative do think will be appropriate for outpatient follow-up and treatment of bronchitis.  Patient is agreeable to plan. [PR]    Clinical Course User Index [PR] Willy Eddy, MD     FINAL CLINICAL IMPRESSION(S) / ED DIAGNOSES   Final diagnoses:  Bronchitis     Rx / DC Orders   ED Discharge Orders          Ordered    predniSONE (DELTASONE) 20 MG tablet  Daily        07/15/23 1524    azithromycin (ZITHROMAX Z-PAK) 250 MG tablet        07/15/23 1524             Note:  This document was prepared using Dragon voice recognition software and may include unintentional dictation errors.    Willy Eddy, MD 07/15/23 1524

## 2023-07-15 NOTE — Telephone Encounter (Signed)
Pt called stating he needed to speak with one of Dr. Alford Highland nurses. Pt stated he had been having a burning pain in his chest since Monday. Advised pt to go to ER immediately. Offered to call EMS. Pt declined. Stated he would call them and go to ER.

## 2023-07-21 ENCOUNTER — Encounter: Payer: Self-pay | Admitting: *Deleted

## 2023-07-21 ENCOUNTER — Telehealth: Payer: Self-pay | Admitting: *Deleted

## 2023-07-21 DIAGNOSIS — J449 Chronic obstructive pulmonary disease, unspecified: Secondary | ICD-10-CM

## 2023-07-21 NOTE — Telephone Encounter (Signed)
Called to follow up with pt after being placed on medical hold for pulmonary rehab. His last rehab session was 05/30/23 and completed 14/36 sessions. He had discussed possible early discharge d/t being in the hospital and was also referred to the heart failure clinic. Pt will discharge at this time and states he will follow up with Dr. [name] this month. He is interested in returning when he is able to attend consistently with a new referral.

## 2023-07-21 NOTE — Progress Notes (Signed)
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)  Patient's Home Medications on Admission:  Current Outpatient Medications:    albuterol (VENTOLIN HFA) 108 (90 Base) MCG/ACT inhaler, TAKE 2 PUFFS BY MOUTH EVERY 6 HOURS AS NEEDED FOR WHEEZE OR SHORTNESS OF BREATH, Disp: 18 each, Rfl: 2   ALPRAZolam (XANAX) 0.5 MG tablet, Take 0.5 mg by mouth 3 (three) times daily., Disp: , Rfl:    aspirin 81 MG tablet, Take 81 mg by mouth daily., Disp: , Rfl:    atorvastatin (LIPITOR) 80 MG tablet, Take 1 tablet (80 mg total) by mouth daily., Disp: 90 tablet, Rfl: 3   azithromycin (ZITHROMAX) 250 MG tablet, Take 250 mg by mouth 3 (three) times a week., Disp: , Rfl:    budesonide (PULMICORT) 0.5 MG/2ML nebulizer solution, Take 2 mLs (0.5 mg total) by nebulization in the morning and at bedtime., Disp: 360 mL, Rfl: 10   dapagliflozin propanediol (FARXIGA) 10 MG TABS tablet, Take 1 tablet (10 mg total) by mouth daily before breakfast., Disp: 30 tablet, Rfl: 6   fluticasone (FLONASE) 50 MCG/ACT nasal spray, Place 2 sprays into both nostrils daily., Disp: , Rfl:    furosemide (LASIX) 20 MG tablet, Take 1 tablet (20 mg total) by mouth as needed., Disp: 30 tablet, Rfl: 1   ipratropium-albuterol (DUONEB) 0.5-2.5 (3) MG/3ML SOLN, Take 3 mLs by nebulization 2 (two) times daily., Disp: , Rfl:    ipratropium-albuterol (DUONEB) 0.5-2.5 (3) MG/3ML SOLN, Take 3 mLs by nebulization every 6 (six) hours as needed., Disp: 360 mL, Rfl: 11   isosorbide mononitrate (IMDUR) 30 MG 24 hr tablet, Take 1 tablet (30 mg total) by  mouth daily., Disp: 90 tablet, Rfl: 3   ketoconazole (NIZORAL) 2 % cream, Apply topically 2 (two) times daily., Disp: , Rfl:    loratadine (CLARITIN) 10 MG tablet, Take 10 mg by mouth daily., Disp: , Rfl:    mirtazapine (REMERON) 45 MG tablet, Take 45 mg by mouth at bedtime., Disp: , Rfl:    Multiple Vitamin (MULTIVITAMIN) capsule, Take 1 capsule by mouth daily., Disp: , Rfl:    nitroGLYCERIN (NITROSTAT) 0.4 MG SL tablet, Place 1 tablet (0.4 mg total) under the tongue every 5 (five) minutes as needed., Disp: 25 tablet, Rfl: 3   OXYGEN, Inhale 3 L into the lungs daily., Disp: , Rfl:    pantoprazole (PROTONIX) 40 MG tablet, Take 1 tablet (40 mg total) by mouth 2 (two) times daily before a meal., Disp: 60 tablet, Rfl: 3   sacubitril-valsartan (ENTRESTO) 24-26 MG, Take 1 tablet by mouth 2 (two) times daily., Disp: 60 tablet, Rfl: 6   Spacer/Aero-Holding Chambers (AEROCHAMBER MV) inhaler, Use as instructed, Disp: 1 each, Rfl: 2   spironolactone (ALDACTONE) 25 MG tablet, Take 0.5 tablets (12.5 mg total) by mouth every evening., Disp: 30 tablet, Rfl: 6  Past Medical History: Past Medical History:  Diagnosis Date   Allergic rhinitis    Anxiety    Back pain    Cancer (HCC)    prostate   CHF (congestive heart failure) (HCC)    Colon  polyps 08/2012   colonoscopy; multiple colon polyps; repeat colonoscopy in one year.   Complication of anesthesia    COPD (chronic obstructive pulmonary disease) (HCC)    a. on home O2.   Coronary artery disease 11/2009   a. late presenting ant MI; b. LHC 100% pLAD s/p PCI/DES, 99% mRCA s/p PCI/DES, EF 35%; c. nuclear stress test 05/13: prior ant/inf infarcts w/o ischemia, EF 43%; d. LHC 02/14: widely patent stents with no other obs dz, EF 40%; e. LHC 11/17: LM nl, mLAD 10%, patent LAD stent, dLAD 20%, p-mRCA 10%, mRCA 40%, patent RCA stent, EF 35-45%    Depression    Diabetes (HCC)    Dysphagia    Family history of adverse reaction to anesthesia    GERD  (gastroesophageal reflux disease)    Headache(784.0)    Helicobacter pylori (H. pylori)    Hemorrhoids    Hernia    inguinal   HFrEF (heart failure with reduced ejection fraction) (HCC)    a. 10/2016 LV gram: EF 35-45%; b. 06/2018 Echo: EF 40-45%. c. 01/2021 Echo: EF 40-45%   Hyperlipidemia    Hypertension    Insomnia    Ischemic cardiomyopathy    a. 10/2016 LV gram: EF 35-45%; b. 06/2018 Echo: EF 40-45%.   Myalgia    Oxygen dependent    Peptic ulcer    Pulmonary nodules    ST elevation (STEMI) myocardial infarction involving left anterior descending coronary artery (HCC) 11/2009   a. s/p PCI to the LAD   Status post dilation of esophageal narrowing 2000   Thickening of esophagus    Tinea pedis    Wears dentures    partial upper    Tobacco Use: Social History   Tobacco Use  Smoking Status Former   Current packs/day: 0.00   Average packs/day: 3.0 packs/day for 42.0 years (126.0 ttl pk-yrs)   Types: Cigarettes   Start date: 02/10/1977   Quit date: 02/10/2019   Years since quitting: 4.4  Smokeless Tobacco Never  Tobacco Comments   25 months ago most smoked 4 packs a day .    Labs: Review Flowsheet  More data exists      Latest Ref Rng & Units 03/18/2021 01/26/2023 04/04/2023 06/27/2023 07/07/2023  Labs for ITP Cardiac and Pulmonary Rehab  Cholestrol 0 - 200 mg/dL 161  096  - 045  409   LDL (calc) 0 - 99 mg/dL 81  47  - 48  58   HDL-C >40 mg/dL 69  87  - 48  46   Trlycerides <150 mg/dL 77  811  - 914  782   PH, Arterial 7.35 - 7.45 - - 7.317  - -  PCO2 arterial 32 - 48 mmHg - - 66.4  - -  Bicarbonate 20.0 - 28.0 mmol/L - - 34.0  29.2  - -  TCO2 22 - 32 mmol/L - - 36  31  - -  O2 Saturation % - - 96  70  - -    Details       Multiple values from one day are sorted in reverse-chronological order          Pulmonary Assessment Scores:  Pulmonary Assessment Scores     Row Name 03/28/23 1702         ADL UCSD   ADL Phase Entry     SOB Score total 77     Rest 1      Walk 3     Stairs  4     Bath 4     Dress 3     Shop 4       CAT Score   CAT Score 21       mMRC Score   mMRC Score 3              UCSD: Self-administered rating of dyspnea associated with activities of daily living (ADLs) 6-point scale (0 = "not at all" to 5 = "maximal or unable to do because of breathlessness")  Scoring Scores range from 0 to 120.  Minimally important difference is 5 units  CAT: CAT can identify the health impairment of COPD patients and is better correlated with disease progression.  CAT has a scoring range of zero to 40. The CAT score is classified into four groups of low (less than 10), medium (10 - 20), high (21-30) and very high (31-40) based on the impact level of disease on health status. A CAT score over 10 suggests significant symptoms.  A worsening CAT score could be explained by an exacerbation, poor medication adherence, poor inhaler technique, or progression of COPD or comorbid conditions.  CAT MCID is 2 points  mMRC: mMRC (Modified Medical Research Council) Dyspnea Scale is used to assess the degree of baseline functional disability in patients of respiratory disease due to dyspnea. No minimal important difference is established. A decrease in score of 1 point or greater is considered a positive change.   Pulmonary Function Assessment:  Pulmonary Function Assessment - 03/28/23 1651       Pulmonary Function Tests   FVC% 40 %    FEV1% 20 %   post bronchodilator   FEV1/FVC Ratio 37             Exercise Target Goals: Exercise Program Goal: Individual exercise prescription set using results from initial 6 min walk test and THRR while considering  patient's activity barriers and safety.   Exercise Prescription Goal: Initial exercise prescription builds to 30-45 minutes a day of aerobic activity, 2-3 days per week.  Home exercise guidelines will be given to patient during program as part of exercise prescription that the participant will  acknowledge.  Education: Aerobic Exercise: - Group verbal and visual presentation on the components of exercise prescription. Introduces F.I.T.T principle from ACSM for exercise prescriptions.  Reviews F.I.T.T. principles of aerobic exercise including progression. Written material given at graduation.   Education: Resistance Exercise: - Group verbal and visual presentation on the components of exercise prescription. Introduces F.I.T.T principle from ACSM for exercise prescriptions  Reviews F.I.T.T. principles of resistance exercise including progression. Written material given at graduation.    Education: Exercise & Equipment Safety: - Individual verbal instruction and demonstration of equipment use and safety with use of the equipment. Flowsheet Row Pulmonary Rehab from 05/04/2023 in [facility] Cardiac and Pulmonary Rehab  Date 03/28/23  Educator [facility]  Instruction Review Code 1- Verbalizes Understanding       Education: Exercise Physiology & General Exercise Guidelines: - Group verbal and written instruction with models to review the exercise physiology of the cardiovascular system and associated critical values. Provides general exercise guidelines with specific guidelines to those with heart or lung disease.  Flowsheet Row Pulmonary Rehab from 05/04/2023 in [facility] Cardiac and Pulmonary Rehab  Education need identified 03/28/23       Education: Flexibility, Balance, Mind/Body Relaxation: - Group verbal and visual presentation with interactive activity on the components of exercise prescription. Introduces F.I.T.T principle from ACSM for exercise prescriptions. Reviews  F.I.T.T. principles of flexibility and balance exercise training including progression. Also discusses the mind body connection.  Reviews various relaxation techniques to help reduce and manage stress (i.e. Deep breathing, progressive muscle relaxation, and visualization). Balance handout provided to take home. Written material given  at graduation.   Activity Barriers & Risk Stratification:  Activity Barriers & Cardiac Risk Stratification - 03/28/23 1647       Activity Barriers & Cardiac Risk Stratification   Activity Barriers Shortness of Breath             6 Minute Walk:  6 Minute Walk     Row Name 03/28/23 1644         6 Minute Walk   Phase Initial     Distance 765 feet     Walk Time 4.75 minutes     # of Rest Breaks 2     MPH 1.83     METS 1.9     RPE 13     Perceived Dyspnea  3     VO2 Peak 6.64     Symptoms No     Resting HR 87 bpm     Resting BP 122/60     Resting Oxygen Saturation  94 %     Exercise Oxygen Saturation  during 6 min walk 80 %     Max Ex. HR 116 bpm     Max Ex. BP 148/64     2 Minute Post BP 126/80       Interval HR   1 Minute HR 104     2 Minute HR 101     3 Minute HR 105     4 Minute HR 103     5 Minute HR 112     6 Minute HR 116     2 Minute Post HR 93     Interval Heart Rate? Yes       Interval Oxygen   Interval Oxygen? Yes     Baseline Oxygen Saturation % 94 %     1 Minute Oxygen Saturation % 90 %     1 Minute Liters of Oxygen 3 L     2 Minute Oxygen Saturation % 87 %     2 Minute Liters of Oxygen 3 L     3 Minute Oxygen Saturation % 81 %     3 Minute Liters of Oxygen 3 L     4 Minute Oxygen Saturation % 83 %     4 Minute Liters of Oxygen 3 L     5 Minute Oxygen Saturation % 80 %     5 Minute Liters of Oxygen 3 L     6 Minute Oxygen Saturation % 81 %     6 Minute Liters of Oxygen 3 L     2 Minute Post Oxygen Saturation % 89 %     2 Minute Post Liters of Oxygen 3 L             Oxygen Initial Assessment:  Oxygen Initial Assessment - 03/28/23 1700       Home Oxygen   Home Oxygen Device Home Concentrator;Portable Concentrator    Sleep Oxygen Prescription Continuous    Liters per minute 3    Home Exercise Oxygen Prescription Continuous    Liters per minute 3    Home Resting Oxygen Prescription Continuous    Liters per minute 3     Compliance with Home Oxygen Use Yes  Initial 6 min Walk   Oxygen Used Continuous;E-Tanks    Liters per minute 3      Program Oxygen Prescription   Program Oxygen Prescription Continuous;E-Tanks    Liters per minute 3      Intervention   Short Term Goals To learn and exhibit compliance with exercise, home and travel O2 prescription;To learn and understand importance of monitoring SPO2 with pulse oximeter and demonstrate accurate use of the pulse oximeter.;To learn and understand importance of maintaining oxygen saturations>88%;To learn and demonstrate proper pursed lip breathing techniques or other breathing techniques. ;To learn and demonstrate proper use of respiratory medications    Long  Term Goals Exhibits compliance with exercise, home  and travel O2 prescription;Verbalizes importance of monitoring SPO2 with pulse oximeter and return demonstration;Maintenance of O2 saturations>88%;Exhibits proper breathing techniques, such as pursed lip breathing or other method taught during program session;Compliance with respiratory medication;Demonstrates proper use of MDI's             Oxygen Re-Evaluation:  Oxygen Re-Evaluation     Row Name 04/13/23 1352 04/25/23 1346 05/23/23 1353         Program Oxygen Prescription   Program Oxygen Prescription -- Continuous;E-Tanks Continuous;E-Tanks     Liters per minute -- 3  4 liters if needed 4       Home Oxygen   Home Oxygen Device -- Home Concentrator;Portable Concentrator Home Concentrator;Portable Concentrator     Sleep Oxygen Prescription -- Continuous Continuous     Liters per minute -- 3 3     Home Exercise Oxygen Prescription -- Continuous Continuous     Liters per minute -- 3 3     Home Resting Oxygen Prescription -- Continuous Continuous     Liters per minute -- 3 3     Compliance with Home Oxygen Use -- Yes Yes       Goals/Expected Outcomes   Short Term Goals -- To learn and demonstrate proper pursed lip breathing techniques  or other breathing techniques.  To learn and understand importance of maintaining oxygen saturations>88%;To learn and understand importance of monitoring SPO2 with pulse oximeter and demonstrate accurate use of the pulse oximeter.     Long  Term Goals -- Exhibits proper breathing techniques, such as pursed lip breathing or other method taught during program session Verbalizes importance of monitoring SPO2 with pulse oximeter and return demonstration;Maintenance of O2 saturations>88%     Comments Reviewed PLB technique with pt.  Talked about how it works and it's importance in maintaining their exercise saturations. Informed patient how to perform the Pursed Lipped breathing technique. Told patient to Inhale through the nose and out the mouth with pursed lips to keep their airways open, help oxygenate them better, practice when at rest or doing strenuous activity. Patient Verbalizes understanding of technique and will work on and be reiterated during [facility]. Patient has a pulse oximeter to check oxygen saturation at home. Informed and explained to patient why it is important to check oxygen at rest and on exertion at home. Informed patient that they should be 88 percent and above with their oxygen readings and that it is important to have levels above 88 to prevent damage to tissues in the body. Patient verbalizes understanding.     Goals/Expected Outcomes Short: Become more profiecient at using PLB.   Long: Become independent at using PLB. Short: use PLB with exertion. Long: use PLB on exertion proficiently and independently. Short: monitor oxygen at rest and with exertion at home. Long:  maintain oxygen saturations above 88 percent independently.              Oxygen Discharge (Final Oxygen Re-Evaluation):  Oxygen Re-Evaluation - 05/23/23 1353       Program Oxygen Prescription   Program Oxygen Prescription Continuous;E-Tanks    Liters per minute 4      Home Oxygen   Home Oxygen Device Home  Concentrator;Portable Concentrator    Sleep Oxygen Prescription Continuous    Liters per minute 3    Home Exercise Oxygen Prescription Continuous    Liters per minute 3    Home Resting Oxygen Prescription Continuous    Liters per minute 3    Compliance with Home Oxygen Use Yes      Goals/Expected Outcomes   Short Term Goals To learn and understand importance of maintaining oxygen saturations>88%;To learn and understand importance of monitoring SPO2 with pulse oximeter and demonstrate accurate use of the pulse oximeter.    Long  Term Goals Verbalizes importance of monitoring SPO2 with pulse oximeter and return demonstration;Maintenance of O2 saturations>88%    Comments Patient has a pulse oximeter to check oxygen saturation at home. Informed and explained to patient why it is important to check oxygen at rest and on exertion at home. Informed patient that they should be 88 percent and above with their oxygen readings and that it is important to have levels above 88 to prevent damage to tissues in the body. Patient verbalizes understanding.    Goals/Expected Outcomes Short: monitor oxygen at rest and with exertion at home. Long: maintain oxygen saturations above 88 percent independently.             Initial Exercise Prescription:  Initial Exercise Prescription - 03/28/23 1600       Date of Initial Exercise RX and Referring Provider   Date 03/28/23    Referring Provider [name]      Oxygen   Oxygen Continuous    Liters 3    Maintain Oxygen Saturation 88% or higher      Treadmill   MPH 11.4    Grade 0    Minutes 15    METs 2.02      Recumbant Bike   Level 1    RPM 50    Watts 15    Minutes 15      NuStep   Level 1    SPM 80    Minutes 15    METs 1.9      Arm Ergometer   Level 1    RPM 30    Minutes 15    METs 1.9      REL-XR   Level 1    Speed 50    Minutes 15    METs 1.9      Track   Laps 20    Minutes 15    METs 2.09      Prescription Details    Frequency (times per week) 3    Duration Progress to 30 minutes of continuous aerobic without signs/symptoms of physical distress      Intensity   THRR 40-80% of Max Heartrate 112-130    Ratings of Perceived Exertion 11-13    Perceived Dyspnea 0-4      Progression   Progression Continue to progress workloads to maintain intensity without signs/symptoms of physical distress.      Resistance Training   Training Prescription Yes    Weight 5    Reps 10-15  Perform Capillary Blood Glucose checks as needed.  Exercise Prescription Changes:   Exercise Prescription Changes     Row Name 03/28/23 1600 04/14/23 1600 04/25/23 1400 05/09/23 1400 05/24/23 1500     Response to Exercise   Blood Pressure (Admit) 122/60 134/66 140/68 128/70 104/60   Blood Pressure (Exercise) 148/64 162/70 178/82 134/60 --   Blood Pressure (Exit) 126/80 134/68 112/64 126/72 98/58   Heart Rate (Admit) 87 bpm 99 bpm 88 bpm 99 bpm 103 bpm   Heart Rate (Exercise) 116 bpm 127 bpm 132 bpm 111 bpm 116 bpm   Heart Rate (Exit) 93 bpm 80 bpm 114 bpm 105 bpm 107 bpm   Oxygen Saturation (Admit) 94 % 93 % 94 % 94 % 93 %   Oxygen Saturation (Exercise) 80 % 86 % 88 % 88 % 91 %   Oxygen Saturation (Exit) 95 % 93 % 93 % 94 % 95 %   Rating of Perceived Exertion (Exercise) 13 12 13 13 12    Perceived Dyspnea (Exercise) 3 2 3 3 3    Symptoms none none none SOB SOB   Comments 6 MWT resutls First two sessions of rehab -- -- --   Duration -- Progress to 30 minutes of  aerobic without signs/symptoms of physical distress Progress to 30 minutes of  aerobic without signs/symptoms of physical distress Continue with 30 min of aerobic exercise without signs/symptoms of physical distress. Continue with 30 min of aerobic exercise without signs/symptoms of physical distress.   Intensity -- THRR unchanged THRR unchanged THRR unchanged THRR unchanged     Progression   Progression -- Continue to progress workloads to maintain  intensity without signs/symptoms of physical distress. Continue to progress workloads to maintain intensity without signs/symptoms of physical distress. Continue to progress workloads to maintain intensity without signs/symptoms of physical distress. Continue to progress workloads to maintain intensity without signs/symptoms of physical distress.   Average METs -- 3.5 2.94 2.68 2.27     Resistance Training   Training Prescription -- Yes Yes Yes Yes   Weight -- 5 lb 5 lb 5 lb 5 lb   Reps -- 10-15 10-15 10-15 10-15     Interval Training   Interval Training -- No No No No     Oxygen   Oxygen -- Continuous Continuous Continuous Continuous   Liters -- 3 3 3-4 4     Treadmill   MPH -- -- 1.4 1.4 --   Grade -- -- 0 0 --   Minutes -- -- 15 15 --   METs -- -- 2.07 2.07 --     Recumbant Bike   Level -- 3 3 3  2.5   Watts -- 15 15 26 15    Minutes -- 15 15 15 15    METs -- -- -- 2.54 2.53     NuStep   Level -- 4 4 3 3    Minutes -- 30 15 15 15    METs -- 3.4 3.5 3.5 --     REL-XR   Level -- -- -- -- 1   Minutes -- -- -- -- 15   METs -- -- -- -- 2     T5 Nustep   Level -- -- -- 4 --   Minutes -- -- -- 15 --     Oxygen   Maintain Oxygen Saturation -- 88% or higher 88% or higher 88% or higher 88% or higher    Row Name 06/08/23 1600  Response to Exercise   Blood Pressure (Admit) 100/62       Blood Pressure (Exit) 110/68       Heart Rate (Admit) 97 bpm       Heart Rate (Exercise) 116 bpm       Heart Rate (Exit) 115 bpm       Oxygen Saturation (Admit) 93 %       Oxygen Saturation (Exercise) 88 %       Oxygen Saturation (Exit) 93 %       Rating of Perceived Exertion (Exercise) 12       Perceived Dyspnea (Exercise) 3       Symptoms SOB       Duration Continue with 30 min of aerobic exercise without signs/symptoms of physical distress.       Intensity THRR unchanged         Progression   Progression Continue to progress workloads to maintain intensity without  signs/symptoms of physical distress.       Average METs 2.81         Resistance Training   Training Prescription Yes       Weight 5 lb       Reps 10-15         Interval Training   Interval Training No         Oxygen   Oxygen Continuous       Liters 4         Treadmill   MPH 1.5       Grade 0       Minutes 15       METs 2.15         Recumbant Bike   Level 5       Watts 40       Minutes 15         Arm Ergometer   Level 2.5       Minutes 15       METs 2.1         REL-XR   Level 1       Minutes 15       METs 3.4         T5 Nustep   Level 5       Minutes 15       METs 2.4         Oxygen   Maintain Oxygen Saturation 88% or higher                Exercise Comments:   Exercise Comments     Row Name 04/13/23 1350           Exercise Comments First full day of exercise!  Patient was oriented to gym and equipment including functions, settings, policies, and procedures.  Patient's individual exercise prescription and treatment plan were reviewed.  All starting workloads were established based on the results of the 6 minute walk test done at initial orientation visit.  The plan for exercise progression was also introduced and progression will be customized based on patient's performance and goals.                Exercise Goals and Review:   Exercise Goals     Row Name 03/28/23 1656             Exercise Goals   Increase Physical Activity Yes       Intervention Provide advice, education, support and counseling about physical activity/exercise needs.;Develop an individualized exercise  prescription for aerobic and resistive training based on initial evaluation findings, risk stratification, comorbidities and participant's personal goals.       Expected Outcomes Short Term: Attend rehab on a regular basis to increase amount of physical activity.;Long Term: Add in home exercise to make exercise part of routine and to increase amount of physical  activity.;Long Term: Exercising regularly at least 3-5 days a week.       Increase Strength and Stamina Yes       Intervention Provide advice, education, support and counseling about physical activity/exercise needs.;Develop an individualized exercise prescription for aerobic and resistive training based on initial evaluation findings, risk stratification, comorbidities and participant's personal goals.       Expected Outcomes Short Term: Increase workloads from initial exercise prescription for resistance, speed, and METs.;Short Term: Perform resistance training exercises routinely during rehab and add in resistance training at home;Long Term: Improve cardiorespiratory fitness, muscular endurance and strength as measured by increased METs and functional capacity ( )       Able to understand and use rate of perceived exertion (RPE) scale Yes       Intervention Provide education and explanation on how to use RPE scale       Expected Outcomes Short Term: Able to use RPE daily in rehab to express subjective intensity level       Able to understand and use Dyspnea scale Yes       Intervention Provide education and explanation on how to use Dyspnea scale       Expected Outcomes Short Term: Able to use Dyspnea scale daily in rehab to express subjective sense of shortness of breath during exertion;Long Term: Able to use Dyspnea scale to guide intensity level when exercising independently       Knowledge and understanding of Target Heart Rate Range (THRR) Yes       Intervention Provide education and explanation of THRR including how the numbers were predicted and where they are located for reference       Expected Outcomes Short Term: Able to state/look up THRR;Long Term: Able to use THRR to govern intensity when exercising independently;Short Term: Able to use daily as guideline for intensity in rehab       Able to check pulse independently Yes       Intervention Provide education and demonstration on how  to check pulse in carotid and radial arteries.;Review the importance of being able to check your own pulse for safety during independent exercise       Expected Outcomes Short Term: Able to explain why pulse checking is important during independent exercise;Long Term: Able to check pulse independently and accurately       Understanding of Exercise Prescription Yes       Intervention Provide education, explanation, and written materials on patient's individual exercise prescription       Expected Outcomes Short Term: Able to explain program exercise prescription;Long Term: Able to explain home exercise prescription to exercise independently                Exercise Goals Re-Evaluation :  Exercise Goals Re-Evaluation     Row Name 04/13/23 1351 04/14/23 1643 04/25/23 1441 05/09/23 1406 05/23/23 1354     Exercise Goal Re-Evaluation   Exercise Goals Review Increase Physical Activity;Able to understand and use rate of perceived exertion (RPE) scale;Knowledge and understanding of Target Heart Rate Range (THRR);Understanding of Exercise Prescription;Able to check pulse independently;Able to understand and use Dyspnea scale;Increase Strength and Stamina Increase  Physical Activity;Understanding of Exercise Prescription;Increase Strength and Stamina Increase Physical Activity;Understanding of Exercise Prescription;Increase Strength and Stamina Increase Physical Activity;Understanding of Exercise Prescription;Increase Strength and Stamina Increase Physical Activity;Increase Strength and Stamina   Comments Reviewed RPE scale, THR and program prescription with pt today.  Pt voiced understanding and was given a copy of goals to take home. Damarii is off to a good start in rehab. He had an average MET level of 3.5 METs during his first two sessions. He also was able to work up to level 4 on the T4 nustep and level 3 on the recumbent bike. He has done well with 5 lb hand weights for resistance training as well. We  will continue to monitor his progress. Bethann Berkshire is doing well in rehab. He recently began walking on the treadmill at a speed of 1.4 mph with no incline. He also has continued to work at level 4 on the T4 nustep and level 3 on the recumbent bike. He has done well with 5 lb hand weights for resistance training as well. We will continue to monitor his progress in the program. Harald continues to do well in rehab. He increased his watts on the recumbent bike to 26! He has been consistent at a 1.4 mph speed on the treadmill and could benefit from adding a small incline to his workload. He uses between 3-4 L of O2 during exercise. We will continue to monitor. Shelby has been doing well in rehab.  He is doing nothing as of yet on their off days from rehab. He is feeling like their strength and stamina are at a standtill at the moment. He wants to get better and stronger but does not have the energy to do exercise at home yet.   Expected Outcomes Short: Use RPE daily to regulate intensity.  Long: Follow program prescription in THR. Short: Continue to follow current exercise prescription. Long: Continue to improve strength and stamina. Short: Continue to progressively increase treadmill workload. Long: Continue to improve strength and stamina. Short: Add small incline to treadmill Long: Continue to increase overall MET level and stamina Short: Continue to add in exercise at home on off days Long: Continue to improve strength and stamina    Row Name 05/24/23 1535 06/08/23 1613 06/21/23 1528 07/07/23 1426       Exercise Goal Re-Evaluation   Exercise Goals Review Increase Physical Activity;Increase Strength and Stamina;Understanding of Exercise Prescription Increase Physical Activity;Increase Strength and Stamina;Understanding of Exercise Prescription Increase Physical Activity;Increase Strength and Stamina;Understanding of Exercise Prescription Increase Physical Activity;Increase Strength and Stamina;Understanding of  Exercise Prescription    Comments Daegen is doing well in the program. He has only attended rehab twice since the last review and has not done any walking in the program during that time. He has kept his workloads consistent on seated machines at level 1 on the XR, level 2.5 on the recumbent bike, and level 3 on the T4 nustep. He also continues to do well with 5 lb hand weights for resistance training. We will continue to monitor his progress. Khalel is doing well in the program. He recently increased his overall average MET level to 2.81 METs. He also increased his treadmill workload to a speed of 1.5 mph with no incline, and improved to level 5 on both the recumbent bike and T5 nustep. We will continue to monitor his progress in the program. Derrik has been placed on a medical hold until August. We will continue to monitor his progress once he  returns to the program. Marcangelo continues to be on a medical hold until August. We will continue to monitor his progress once he returns to the program.    Expected Outcomes Short: Return to walking the treadmill in rehab. Long: Continue to increase overall average METs and stamina. Short: Try level 2 on the XR. Long: Continue to improve strength and stamina. Short: Return to rehab when appropriate. Long: Continue to improve stamina. Short: Return to rehab when appropriate. Long: Continue to improve stamina.             Discharge Exercise Prescription (Final Exercise Prescription Changes):  Exercise Prescription Changes - 06/08/23 1600       Response to Exercise   Blood Pressure (Admit) 100/62    Blood Pressure (Exit) 110/68    Heart Rate (Admit) 97 bpm    Heart Rate (Exercise) 116 bpm    Heart Rate (Exit) 115 bpm    Oxygen Saturation (Admit) 93 %    Oxygen Saturation (Exercise) 88 %    Oxygen Saturation (Exit) 93 %    Rating of Perceived Exertion (Exercise) 12    Perceived Dyspnea (Exercise) 3    Symptoms SOB    Duration Continue with 30 min of  aerobic exercise without signs/symptoms of physical distress.    Intensity THRR unchanged      Progression   Progression Continue to progress workloads to maintain intensity without signs/symptoms of physical distress.    Average METs 2.81      Resistance Training   Training Prescription Yes    Weight 5 lb    Reps 10-15      Interval Training   Interval Training No      Oxygen   Oxygen Continuous    Liters 4      Treadmill   MPH 1.5    Grade 0    Minutes 15    METs 2.15      Recumbant Bike   Level 5    Watts 40    Minutes 15      Arm Ergometer   Level 2.5    Minutes 15    METs 2.1      REL-XR   Level 1    Minutes 15    METs 3.4      T5 Nustep   Level 5    Minutes 15    METs 2.4      Oxygen   Maintain Oxygen Saturation 88% or higher             Nutrition:  Target Goals: Understanding of nutrition guidelines, daily intake of sodium 1500mg , cholesterol 200mg , calories 30% from fat and 7% or less from saturated fats, daily to have 5 or more servings of fruits and vegetables.  Education: All About Nutrition: -Group instruction provided by verbal, written material, interactive activities, discussions, models, and posters to present general guidelines for heart healthy nutrition including fat, fiber, MyPlate, the role of sodium in heart healthy nutrition, utilization of the nutrition label, and utilization of this knowledge for meal planning. Follow up email sent as well. Written material given at graduation. Flowsheet Row Pulmonary Rehab from 05/04/2023 in [facility] Cardiac and Pulmonary Rehab  Education need identified 03/28/23       Biometrics:  Pre Biometrics - 03/28/23 1657       Pre Biometrics   Height 5\' 4"  (1.626 m)    Weight 200 lb 9.6 oz (91 kg)    Waist Circumference 45 inches  Hip Circumference 43 inches    Waist to Hip Ratio 1.05 %    BMI (Calculated) 34.42    Single Leg Stand 8.06 seconds              Nutrition Therapy Plan and  Nutrition Goals:  Nutrition Therapy & Goals - 03/28/23 1818       Intervention Plan   Intervention Prescribe, educate and counsel regarding individualized specific dietary modifications aiming towards targeted core components such as weight, hypertension, lipid management, diabetes, heart failure and other comorbidities.    Expected Outcomes Short Term Goal: Understand basic principles of dietary content, such as calories, fat, sodium, cholesterol and nutrients.;Short Term Goal: A plan has been developed with personal nutrition goals set during dietitian appointment.;Long Term Goal: Adherence to prescribed nutrition plan.             Nutrition Assessments:  MEDIFICTS Score Key: ?70 Need to make dietary changes  40-70 Heart Healthy Diet ? 40 Therapeutic Level Cholesterol Diet  Flowsheet Row Pulmonary Rehab from 03/28/2023 in [facility] Cardiac and Pulmonary Rehab  Picture Your Plate Total Score on Admission 53      Picture Your Plate Scores: <40 Unhealthy dietary pattern with much room for improvement. 41-50 Dietary pattern unlikely to meet recommendations for good health and room for improvement. 51-60 More healthful dietary pattern, with some room for improvement.  >60 Healthy dietary pattern, although there may be some specific behaviors that could be improved.   Nutrition Goals Re-Evaluation:  Nutrition Goals Re-Evaluation     Row Name 04/25/23 1348             Goals   Current Weight 193 lb (87.5 kg)       Comment Patient was informed on why it is important to maintain a balanced diet when dealing with Respiratory issues. Explained that it takes a lot of energy to breath and when they are short of breath often they will need to have a good diet to help keep up with the calories they are expending for breathing.       Expected Outcome Short: Choose and plan snacks accordingly to patients caloric intake to improve breathing. Long: Maintain a diet independently that meets their  caloric intake to aid in daily shortness of breath.                Nutrition Goals Discharge (Final Nutrition Goals Re-Evaluation):  Nutrition Goals Re-Evaluation - 04/25/23 1348       Goals   Current Weight 193 lb (87.5 kg)    Comment Patient was informed on why it is important to maintain a balanced diet when dealing with Respiratory issues. Explained that it takes a lot of energy to breath and when they are short of breath often they will need to have a good diet to help keep up with the calories they are expending for breathing.    Expected Outcome Short: Choose and plan snacks accordingly to patients caloric intake to improve breathing. Long: Maintain a diet independently that meets their caloric intake to aid in daily shortness of breath.             Psychosocial: Target Goals: Acknowledge presence or absence of significant depression and/or stress, maximize coping skills, provide positive support system. Participant is able to verbalize types and ability to use techniques and skills needed for reducing stress and depression.   Education: Stress, Anxiety, and Depression - Group verbal and visual presentation to define topics covered.  Reviews how  body is impacted by stress, anxiety, and depression.  Also discusses healthy ways to reduce stress and to treat/manage anxiety and depression.  Written material given at graduation. Flowsheet Row Pulmonary Rehab from 03/31/2018 in [facility] Cardiac and Pulmonary Rehab  Date 02/08/18  Educator [facility]  Instruction Review Code 1- [employer] Understanding       Education: Sleep Hygiene -Provides group verbal and written instruction about how sleep can affect your health.  Define sleep hygiene, discuss sleep cycles and impact of sleep habits. Review good sleep hygiene tips.    Initial Review & Psychosocial Screening:  Initial Psych Review & Screening - 03/21/23 1116       Initial Review   Current issues with Current Psychotropic  Meds;Current Depression;Current Anxiety/Panic      Family Dynamics   Good Support System? Yes   daughters  live close, brother in law takes him to appts     Barriers   Psychosocial barriers to participate in program There are no identifiable barriers or psychosocial needs.      Screening Interventions   Interventions Encouraged to exercise;To provide support and resources with identified psychosocial needs;Provide feedback about the scores to participant    Expected Outcomes Short Term goal: Utilizing psychosocial counselor, staff and physician to assist with identification of specific Stressors or current issues interfering with healing process. Setting desired goal for each stressor or current issue identified.;Long Term Goal: Stressors or current issues are controlled or eliminated.;Short Term goal: Identification and review with participant of any Quality of Life or Depression concerns found by scoring the questionnaire.;Long Term goal: The participant improves quality of Life and PHQ9 Scores as seen by post scores and/or verbalization of changes             Quality of Life Scores:  Scores of 19 and below usually indicate a poorer quality of life in these areas.  A difference of  2-3 points is a clinically meaningful difference.  A difference of 2-3 points in the total score of the Quality of Life Index has been associated with significant improvement in overall quality of life, self-image, physical symptoms, and general health in studies assessing change in quality of life.  PHQ-9: Review Flowsheet  More data exists      05/23/2023 04/25/2023 03/28/2023 07/07/2018 05/22/2018  Depression screen PHQ 2/9  Decreased Interest 2 2 3 2 2   Down, Depressed, Hopeless 2 3 3 2 2   PHQ - 2 Score 4 5 6 4 4   Altered sleeping 2 2 2 2 1   Tired, decreased energy 2 2 2 2 1   Change in appetite 1 2 2 2 1   Feeling bad or failure about yourself  2 3 2 2 1   Trouble concentrating 2 2 2  0 0  Moving slowly or  fidgety/restless 2 2 2  0 0  Suicidal thoughts 0 0 0 0 0  PHQ-9 Score 15 18 18 12 8   Difficult doing work/chores Somewhat difficult Somewhat difficult Somewhat difficult - -    Details           Interpretation of Total Score  Total Score Depression Severity:  1-4 = Minimal depression, 5-9 = Mild depression, 10-14 = Moderate depression, 15-19 = Moderately severe depression, 20-27 = Severe depression   Psychosocial Evaluation and Intervention:  Psychosocial Evaluation - 03/21/23 1125       Psychosocial Evaluation & Interventions   Comments Keighan has no barriers to attending the program. He lives alone. HIs daughter and brother in law are his  support. He has history of depression/anxiety and is on medications managed by his primary physician. He wants to see if he can improve his breathing and be able to perform activities longer than he is at this time. He does wear oxygen all day and night and is compliant. He is ready to return to the program. He was here in 2019.    He is still greiving the loss of his wife in 2019;they had been married 40 years.    Expected Outcomes STG Jatayvion will attend all scheduled sessions, he will continue to work on controlling his depression/anxiety symptoms. He will follow the exercise progression to work on improving his breathing. LTG Nasser maintiais control of his depression/anxiety symptoms. He is able to be a bit more active without increased shortness of breath.    Continue Psychosocial Services  Follow up required by staff             Psychosocial Re-Evaluation:  Psychosocial Re-Evaluation     Row Name 04/25/23 1353 05/23/23 1359           Psychosocial Re-Evaluation   Current issues with Current Anxiety/Panic;History of Depression;Current Depression;Current Psychotropic Meds Current Anxiety/Panic;History of Depression;Current Depression;Current Psychotropic Meds      Comments Reviewed patient health questionnaire (PHQ-9) with patient for  follow up. Previously, patients score indicated signs/symptoms of depression.  Reviewed to see if patient is improving symptom wise while in program.  Score stayed the same and patient states that it is because he has some depression. He is going to meet with his doctor about his depression. Reviewed patient health questionnaire (PHQ-9) with patient for follow up. Previously, patients score indicated signs/symptoms of depression.  Reviewed to see if patient is improving symptom wise while in program.  Score improved and patient states that it is because he has  the motivation to get better.      Expected Outcomes Short: Continue to work toward an improvement in PHQ9 scores by attending [facility] regularly. Long: Continue to improve stress and depression coping skills by talking with staff and attending [facility] regularly and work toward a positive mental state. Short: Continue to attend [facility] regularly for regular exercise and social engagement. Long: Continue to improve symptoms and manage a positive mental state.      Interventions -- Encouraged to attend Pulmonary Rehabilitation for the exercise      Continue Psychosocial Services  Follow up required by staff Follow up required by staff               Psychosocial Discharge (Final Psychosocial Re-Evaluation):  Psychosocial Re-Evaluation - 05/23/23 1359       Psychosocial Re-Evaluation   Current issues with Current Anxiety/Panic;History of Depression;Current Depression;Current Psychotropic Meds    Comments Reviewed patient health questionnaire (PHQ-9) with patient for follow up. Previously, patients score indicated signs/symptoms of depression.  Reviewed to see if patient is improving symptom wise while in program.  Score improved and patient states that it is because he has  the motivation to get better.    Expected Outcomes Short: Continue to attend [facility] regularly for regular exercise and social engagement. Long: Continue to  improve symptoms and manage a positive mental state.    Interventions Encouraged to attend Pulmonary Rehabilitation for the exercise    Continue Psychosocial Services  Follow up required by staff             Education: Education Goals: Education classes will be provided on a weekly basis, covering required topics. Participant will  state understanding/return demonstration of topics presented.  Learning Barriers/Preferences:   General Pulmonary Education Topics:  Infection Prevention: - Provides verbal and written material to individual with discussion of infection control including proper hand washing and proper equipment cleaning during exercise session. Flowsheet Row Pulmonary Rehab from 05/04/2023 in [facility] Cardiac and Pulmonary Rehab  Date 03/28/23  Educator [facility]  Instruction Review Code 1- Verbalizes Understanding       Falls Prevention: - Provides verbal and written material to individual with discussion of falls prevention and safety. Flowsheet Row Pulmonary Rehab from 05/04/2023 in [facility] Cardiac and Pulmonary Rehab  Date 03/28/23  Educator [facility]  Instruction Review Code 5- Refused Teaching       Chronic Lung Disease Review: - Group verbal instruction with posters, models, PowerPoint presentations and videos,  to review new updates, new respiratory medications, new advancements in procedures and treatments. Providing information on websites and "800" numbers for continued self-education. Includes information about supplement oxygen, available portable oxygen systems, continuous and intermittent flow rates, oxygen safety, concentrators, and Medicare reimbursement for oxygen. Explanation of Pulmonary Drugs, including class, frequency, complications, importance of spacers, rinsing mouth after steroid MDI's, and proper cleaning methods for nebulizers. Review of basic lung anatomy and physiology related to function, structure, and complications of lung disease. Review of risk factors.  Discussion about methods for diagnosing sleep apnea and types of masks and machines for OSA. Includes a review of the use of types of environmental controls: home humidity, furnaces, filters, dust mite/pet prevention, HEPA vacuums. Discussion about weather changes, air quality and the benefits of nasal washing. Instruction on Warning signs, infection symptoms, calling MD promptly, preventive modes, and value of vaccinations. Review of effective airway clearance, coughing and/or vibration techniques. Emphasizing that all should Create an Action Plan. Written material given at graduation. Flowsheet Row Pulmonary Rehab from 05/04/2023 in [facility] Cardiac and Pulmonary Rehab  Education need identified 03/28/23  Date 05/04/23  Educator [facility]  Instruction Review Code 1- Verbalizes Understanding       AED/CPR: - Group verbal and written instruction with the use of models to demonstrate the basic use of the AED with the basic ABC's of resuscitation. Flowsheet Row Pulmonary Rehab from 03/31/2018 in [facility] Cardiac and Pulmonary Rehab  Date 02/17/18  Educator [facility]  Instruction Review Code 1- Verbalizes Understanding        Anatomy and Cardiac Procedures: - Group verbal and visual presentation and models provide information about basic cardiac anatomy and function. Reviews the testing methods done to diagnose heart disease and the outcomes of the test results. Describes the treatment choices: Medical Management, Angioplasty, or Coronary Bypass Surgery for treating various heart conditions including Myocardial Infarction, Angina, Valve Disease, and Cardiac Arrhythmias.  Written material given at graduation.   Medication Safety: - Group verbal and visual instruction to review commonly prescribed medications for heart and lung disease. Reviews the medication, class of the drug, and side effects. Includes the steps to properly store meds and maintain the prescription regimen.  Written material given at  graduation. Flowsheet Row Pulmonary Rehab from 03/31/2018 in [facility] Cardiac and Pulmonary Rehab  Date 02/03/18  Educator [facility]  Instruction Review Code 1- Verbalizes Understanding       Other: -Provides group and verbal instruction on various topics (see comments)   Knowledge Questionnaire Score:  Knowledge Questionnaire Score - 03/28/23 1702       Knowledge Questionnaire Score   Pre Score 12/18  Core Components/Risk Factors/Patient Goals at Admission:  Personal Goals and Risk Factors at Admission - 03/28/23 1657       Core Components/Risk Factors/Patient Goals on Admission    Weight Management Yes    Intervention Weight Management: Develop a combined nutrition and exercise program designed to reach desired caloric intake, while maintaining appropriate intake of nutrient and fiber, sodium and fats, and appropriate energy expenditure required for the weight goal.;Weight Management: Provide education and appropriate resources to help participant work on and attain dietary goals.;Weight Management/Obesity: Establish reasonable short term and long term weight goals.    Admit Weight 200 lb 9.6 oz (91 kg)    Goal Weight: Short Term 190 lb (86.2 kg)    Goal Weight: Long Term 175 lb (79.4 kg)    Expected Outcomes Short Term: Continue to assess and modify interventions until short term weight is achieved;Long Term: Adherence to nutrition and physical activity/exercise program aimed toward attainment of established weight goal;Weight Loss: Understanding of general recommendations for a balanced deficit meal plan, which promotes 1-2 lb weight loss per week and includes a negative energy balance of [phone] kcal/d;Understanding recommendations for meals to include 15-35% energy as protein, 25-35% energy from fat, 35-60% energy from carbohydrates, less than 200mg  of dietary cholesterol, 20-35 gm of total fiber daily;Understanding of distribution of calorie intake throughout the day with  the consumption of 4-5 meals/snacks    Improve shortness of breath with ADL's Yes    Intervention Provide education, individualized exercise plan and daily activity instruction to help decrease symptoms of SOB with activities of daily living.    Expected Outcomes Short Term: Improve cardiorespiratory fitness to achieve a reduction of symptoms when performing ADLs;Long Term: Be able to perform more ADLs without symptoms or delay the onset of symptoms    Increase knowledge of respiratory medications and ability to use respiratory devices properly  Yes    Intervention Provide education and demonstration as needed of appropriate use of medications, inhalers, and oxygen therapy.    Expected Outcomes Short Term: Achieves understanding of medications use. Understands that oxygen is a medication prescribed by physician. Demonstrates appropriate use of inhaler and oxygen therapy.;Long Term: Maintain appropriate use of medications, inhalers, and oxygen therapy.    Diabetes Yes   no meds, getting A1C checked soon to confirm proper diagnosis   Intervention Provide education about signs/symptoms and action to take for hypo/hyperglycemia.;Provide education about proper nutrition, including hydration, and aerobic/resistive exercise prescription along with prescribed medications to achieve blood glucose in normal ranges: Fasting glucose 65-99 mg/dL    Expected Outcomes Short Term: Participant verbalizes understanding of the signs/symptoms and immediate care of hyper/hypoglycemia, proper foot care and importance of medication, aerobic/resistive exercise and nutrition plan for blood glucose control.;Long Term: Attainment of HbA1C < 7%.    Heart Failure Yes    Intervention Provide a combined exercise and nutrition program that is supplemented with education, support and counseling about heart failure. Directed toward relieving symptoms such as shortness of breath, decreased exercise tolerance, and extremity edema.     Expected Outcomes Improve functional capacity of life;Short term: Attendance in program 2-3 days a week with increased exercise capacity. Reported lower sodium intake. Reported increased fruit and vegetable intake. Reports medication compliance.;Short term: Daily weights obtained and reported for increase. Utilizing diuretic protocols set by physician.;Long term: Adoption of self-care skills and reduction of barriers for early signs and symptoms recognition and intervention leading to self-care maintenance.    Hypertension Yes    Intervention Provide  education on lifestyle modifcations including regular physical activity/exercise, weight management, moderate sodium restriction and increased consumption of fresh fruit, vegetables, and low fat dairy, alcohol moderation, and smoking cessation.;Monitor prescription use compliance.    Expected Outcomes Short Term: Continued assessment and intervention until BP is < 140/97mm HG in hypertensive participants. < 130/19mm HG in hypertensive participants with diabetes, heart failure or chronic kidney disease.;Long Term: Maintenance of blood pressure at goal levels.    Lipids Yes    Intervention Provide education and support for participant on nutrition & aerobic/resistive exercise along with prescribed medications to achieve LDL 70mg , HDL >40mg .    Expected Outcomes Short Term: Participant states understanding of desired cholesterol values and is compliant with medications prescribed. Participant is following exercise prescription and nutrition guidelines.;Long Term: Cholesterol controlled with medications as prescribed, with individualized exercise RX and with personalized nutrition plan. Value goals: LDL < 70mg , HDL > 40 mg.    Stress Yes    Intervention Offer individual and/or small group education and counseling on adjustment to heart disease, stress management and health-related lifestyle change. Teach and support self-help strategies.;Refer participants  experiencing significant psychosocial distress to appropriate mental health specialists for further evaluation and treatment. When possible, include family members and significant others in education/counseling sessions.    Expected Outcomes Short Term: Participant demonstrates changes in health-related behavior, relaxation and other stress management skills, ability to obtain effective social support, and compliance with psychotropic medications if prescribed.;Long Term: Emotional wellbeing is indicated by absence of clinically significant psychosocial distress or social isolation.             Education:Diabetes - Individual verbal and written instruction to review signs/symptoms of diabetes, desired ranges of glucose level fasting, after meals and with exercise. Acknowledge that pre and post exercise glucose checks will be done for 3 sessions at entry of program.   Know Your Numbers and Heart Failure: - Group verbal and visual instruction to discuss disease risk factors for cardiac and pulmonary disease and treatment options.  Reviews associated critical values for Overweight/Obesity, Hypertension, Cholesterol, and Diabetes.  Discusses basics of heart failure: signs/symptoms and treatments.  Introduces Heart Failure Zone chart for action plan for heart failure.  Written material given at graduation.   Core Components/Risk Factors/Patient Goals Review:   Goals and Risk Factor Review     Row Name 04/25/23 1347 05/23/23 1356           Core Components/Risk Factors/Patient Goals Review   Personal Goals Review Improve shortness of breath with ADL's Other      Review Spoke to patient about their shortness of breath and what they can do to improve. Patient has been informed of breathing techniques when starting the program. Patient is informed to tell staff if they have had any med changes and that certain meds they are taking or not taking can be causing shortness of breath. Spoke to patient  about COPD Action Plan. Reviewed and talked with the patient about the different levels of COPD severity that they should review daily and the steps they can take to manage their COPD. Went over pertinent actions for the patient can take when they feel like they are having a COPD exacerbation and the necessary treatment if needed. If patient has any changes with their breathing or feel like they are in a different zone of severity to inform staff for re-evaluation. Patient verbalizes understanding. Copy given to patient.      Expected Outcomes Short: Attend [facility] regularly to improve shortness  of breath with ADL's. Long: maintain independence with ADL's Short: Follow COPD action plan. Long: Report any changes and severity of COPD.               Core Components/Risk Factors/Patient Goals at Discharge (Final Review):   Goals and Risk Factor Review - 05/23/23 1356       Core Components/Risk Factors/Patient Goals Review   Personal Goals Review Other    Review Spoke to patient about COPD Action Plan. Reviewed and talked with the patient about the different levels of COPD severity that they should review daily and the steps they can take to manage their COPD. Went over pertinent actions for the patient can take when they feel like they are having a COPD exacerbation and the necessary treatment if needed. If patient has any changes with their breathing or feel like they are in a different zone of severity to inform staff for re-evaluation. Patient verbalizes understanding. Copy given to patient.    Expected Outcomes Short: Follow COPD action plan. Long: Report any changes and severity of COPD.             ITP Comments:  ITP Comments     Row Name 03/21/23 1132 03/28/23 1643 03/29/23 1132 04/06/23 0904 04/13/23 1350   ITP Comments Virtual orientation call completed today. he has an appointment on Date: 03/28/2023  for EP eval and gym Orientation.  Documentation of diagnosis can be found in [facility]  Date: 03/01/2023 . Completed and gym orientation. Initial ITP created and sent for review to Dr. Jinny Sanders, Medical Director. Patient called to let us know he is scheduled for a heart cath on 4/15, and has to delay his start date with pulmonary rehab with [place]. Patient will need clearance to return- patient aware and will call us to let us know when he is able to return with clearance. 30 day review completed. ITP sent to Dr. Jinny Sanders, Medical Director of  Pulmonary Rehab. Continue with ITP unless changes are made by physician.  Delayed start, no exercise sessions yet. First full day of exercise!  Patient was oriented to gym and equipment including functions, settings, policies, and procedures.  Patient's individual exercise prescription and treatment plan were reviewed.  All starting workloads were established based on the results of the 6 minute walk test done at initial orientation visit.  The plan for exercise progression was also introduced and progression will be customized based on patient's performance and goals.    Row Name 05/04/23 0830 05/31/23 1101 06/01/23 0920 06/28/23 1419 07/21/23 1611   ITP Comments 30 Day review completed. Medical Director ITP review done, changes made as directed, and signed approval by Medical Director.   new to program Dezman saw his pulmonologist today.  They would like to pull him on medical hold for rehab at this time.  They feel that his reflux and cancer treatment responses are hindering his breathing on top of the exercise and not letting him make a recovery.  They would like him to hold off until he is feeling better.  Doctor said hold until follow up in August.  Surya wants to call her back in about 4-6 weeks to see how he is doing.  He was told to keep up updated and to call us to sign up for time slot prior to returning to rehab in August. 30 Day review completed. Medical Director ITP review done, changes made as directed, and signed approval by  Medical Director.  on medical hold 30 Day review completed. Medical Director ITP review done, changes made as directed, and signed approval by Medical Director. Called to follow up with pt after being placed on medical hold for pulmonary rehab. His last rehab session was 05/30/23 and completed 14/36 sessions. He had discussed possible early discharge d/t being in the hospital and was also referred to the heart failure clinic. Pt will discharge at this time and states he will follow up with Dr. [name] this month. He is interested in returning when he is able to attend consistently with a new referral.            Comments: Discharge ITP

## 2023-07-21 NOTE — Progress Notes (Signed)
Discharge Summary: Leslie Sims (DOB 1951-09-07)  Pt discharged early from pulmonary rehab. He completed 14/36 sessions. Pt unable to attend at this time.    6 Minute Walk     Row Name 03/28/23 1644         6 Minute Walk   Phase Initial     Distance 765 feet     Walk Time 4.75 minutes     # of Rest Breaks 2     MPH 1.83     METS 1.9     RPE 13     Perceived Dyspnea  3     VO2 Peak 6.64     Symptoms No     Resting HR 87 bpm     Resting BP 122/60     Resting Oxygen Saturation  94 %     Exercise Oxygen Saturation  during 6 min walk 80 %     Max Ex. HR 116 bpm     Max Ex. BP 148/64     2 Minute Post BP 126/80       Interval HR   1 Minute HR 104     2 Minute HR 101     3 Minute HR 105     4 Minute HR 103     5 Minute HR 112     6 Minute HR 116     2 Minute Post HR 93     Interval Heart Rate? Yes       Interval Oxygen   Interval Oxygen? Yes     Baseline Oxygen Saturation % 94 %     1 Minute Oxygen Saturation % 90 %     1 Minute Liters of Oxygen 3 L     2 Minute Oxygen Saturation % 87 %     2 Minute Liters of Oxygen 3 L     3 Minute Oxygen Saturation % 81 %     3 Minute Liters of Oxygen 3 L     4 Minute Oxygen Saturation % 83 %     4 Minute Liters of Oxygen 3 L     5 Minute Oxygen Saturation % 80 %     5 Minute Liters of Oxygen 3 L     6 Minute Oxygen Saturation % 81 %     6 Minute Liters of Oxygen 3 L     2 Minute Post Oxygen Saturation % 89 %     2 Minute Post Liters of Oxygen 3 L

## 2023-07-22 ENCOUNTER — Ambulatory Visit
Admission: RE | Admit: 2023-07-22 | Discharge: 2023-07-22 | Disposition: A | Discharge status: Home / Self Care | Payer: Medicare Other | Source: Ambulatory Visit | Attending: Oncology | Admitting: Oncology

## 2023-07-22 ENCOUNTER — Inpatient Hospital Stay: Payer: Medicare Other | Attending: Oncology

## 2023-07-22 DIAGNOSIS — C159 Malignant neoplasm of esophagus, unspecified: Secondary | ICD-10-CM | POA: Insufficient documentation

## 2023-07-22 DIAGNOSIS — C61 Malignant neoplasm of prostate: Secondary | ICD-10-CM | POA: Diagnosis not present

## 2023-07-22 DIAGNOSIS — Z9221 Personal history of antineoplastic chemotherapy: Secondary | ICD-10-CM | POA: Diagnosis not present

## 2023-07-22 DIAGNOSIS — Z08 Encounter for follow-up examination after completed treatment for malignant neoplasm: Secondary | ICD-10-CM | POA: Insufficient documentation

## 2023-07-22 DIAGNOSIS — Z8501 Personal history of malignant neoplasm of esophagus: Secondary | ICD-10-CM | POA: Insufficient documentation

## 2023-07-22 DIAGNOSIS — K219 Gastro-esophageal reflux disease without esophagitis: Secondary | ICD-10-CM | POA: Diagnosis not present

## 2023-07-22 DIAGNOSIS — Z923 Personal history of irradiation: Secondary | ICD-10-CM | POA: Insufficient documentation

## 2023-07-22 DIAGNOSIS — I5040 Unspecified combined systolic (congestive) and diastolic (congestive) heart failure: Secondary | ICD-10-CM | POA: Insufficient documentation

## 2023-07-22 DIAGNOSIS — I110 Hypertensive heart disease with heart failure: Secondary | ICD-10-CM | POA: Insufficient documentation

## 2023-07-22 LAB — CBC WITH DIFFERENTIAL (CANCER CENTER ONLY)
Abs Immature Granulocytes: 0.04 10*3/uL (ref 0.00–0.07)
Basophils Absolute: 0 10*3/uL (ref 0.0–0.1)
Basophils Relative: 1 %
Eosinophils Absolute: 0.3 10*3/uL (ref 0.0–0.5)
Eosinophils Relative: 4 %
HCT: 41.6 % (ref 39.0–52.0)
Hemoglobin: 13.1 g/dL (ref 13.0–17.0)
Immature Granulocytes: 1 %
Lymphocytes Relative: 15 %
Lymphs Abs: 0.9 10*3/uL (ref 0.7–4.0)
MCH: 28.3 pg (ref 26.0–34.0)
MCHC: 31.5 g/dL (ref 30.0–36.0)
MCV: 89.8 fL (ref 80.0–100.0)
Monocytes Absolute: 0.9 10*3/uL (ref 0.1–1.0)
Monocytes Relative: 15 %
Neutro Abs: 4 10*3/uL (ref 1.7–7.7)
Neutrophils Relative %: 64 %
Platelet Count: 263 10*3/uL (ref 150–400)
RBC: 4.63 MIL/uL (ref 4.22–5.81)
RDW: 13.1 % (ref 11.5–15.5)
WBC Count: 6.1 10*3/uL (ref 4.0–10.5)
nRBC: 0 % (ref 0.0–0.2)

## 2023-07-22 LAB — CMP (CANCER CENTER ONLY)
ALT: 26 U/L (ref 0–44)
AST: 20 U/L (ref 15–41)
Albumin: 3.7 g/dL (ref 3.5–5.0)
Alkaline Phosphatase: 60 U/L (ref 38–126)
Anion gap: 8 (ref 5–15)
BUN: 15 mg/dL (ref 8–23)
CO2: 30 mmol/L (ref 22–32)
Calcium: 8.5 mg/dL — ABNORMAL LOW (ref 8.9–10.3)
Chloride: 103 mmol/L (ref 98–111)
Creatinine: 0.92 mg/dL (ref 0.61–1.24)
GFR, Estimated: >60 mL/min (ref >60)
Glucose, Bld: 116 mg/dL — ABNORMAL HIGH (ref 70–99)
Potassium: 3.5 mmol/L (ref 3.5–5.1)
Sodium: 141 mmol/L (ref 135–145)
Total Bilirubin: 0.4 mg/dL (ref 0.3–1.2)
Total Protein: 6.3 g/dL — ABNORMAL LOW (ref 6.5–8.1)

## 2023-07-22 LAB — PSA: Prostatic Specific Antigen: 0.52 ng/mL (ref 0.00–4.00)

## 2023-07-22 MED ORDER — IOHEXOL 300 MG/ML  SOLN
100.0000 mL | Freq: Once | INTRAMUSCULAR | Status: AC | PRN
  Administered 2023-07-22: 100 mL via INTRAVENOUS

## 2023-07-26 ENCOUNTER — Encounter: Payer: Self-pay | Admitting: Oncology

## 2023-07-26 ENCOUNTER — Inpatient Hospital Stay (HOSPITAL_BASED_OUTPATIENT_CLINIC_OR_DEPARTMENT_OTHER): Payer: Medicare Other | Admitting: Oncology

## 2023-07-26 VITALS — BP 117/60 | HR 95 | Temp 98.2°F | Resp 18 | Wt 190.5 lb

## 2023-07-26 DIAGNOSIS — C61 Malignant neoplasm of prostate: Secondary | ICD-10-CM

## 2023-07-26 DIAGNOSIS — I5032 Chronic diastolic (congestive) heart failure: Secondary | ICD-10-CM

## 2023-07-26 DIAGNOSIS — Z8501 Personal history of malignant neoplasm of esophagus: Secondary | ICD-10-CM | POA: Diagnosis not present

## 2023-07-26 DIAGNOSIS — K219 Gastro-esophageal reflux disease without esophagitis: Secondary | ICD-10-CM | POA: Diagnosis not present

## 2023-07-26 DIAGNOSIS — C159 Malignant neoplasm of esophagus, unspecified: Secondary | ICD-10-CM

## 2023-07-26 NOTE — Assessment & Plan Note (Signed)
Continue PPI ?

## 2023-07-26 NOTE — Progress Notes (Signed)
Hematology/Oncology Progress note [phone] [phone]     Clinic Day:  07/26/2023   Referring physician: [name], MD  Assessment & Plan:  Cancer Staging  Primary esophageal squamous cell carcinoma (HCC) Staging form: Esophagus - Squamous Cell Carcinoma, AJCC 8th Edition - Clinical: Stage II (cT3, cN0, cM0, L: Lower) - Signed by Rickard Patience, MD on 05/31/2022  Prostate cancer [facility]) Staging form: Prostate, AJCC 8th Edition - Clinical: cT2c, cN0, cM0, Grade Group: 2 - Signed by Rickard Patience, MD on 05/04/2022   Primary esophageal squamous cell carcinoma (HCC) He is on concurrent chemotherapy carboplatin/Taxol weekly with radiation. S/p concurrent chemotherapy and radiation [finished 07/26/22] Repeat EGD showed no evidence of malignancy. PET restaging- CR CT scan showed no evidence of recurrence.  Recommend continued surveillance CT scan, next in 6 months   Prostate cancer (HCC) Gleason score 3+4, status post definitive radiation. Has not followed up with urology Dr. Richardo Hanks.   PSA is stable.   Chronic heart failure with preserved ejection fraction (HFpEF) (HCC) Continue follow up with pulmonology  GERD Continue PPI    Orders Placed This Encounter  Procedures   CT CHEST ABDOMEN PELVIS W CONTRAST    Standing Status:   Future    Standing Expiration Date:   07/25/2024    Order Specific Question:   If indicated for the ordered procedure, I authorize the administration of contrast media per Radiology protocol    Answer:   Yes    Order Specific Question:   Does the patient have a contrast media/X-ray dye allergy?    Answer:   No    Order Specific Question:   Preferred imaging location?    Answer:   [facility]    Order Specific Question:   If indicated for the ordered procedure, I authorize the administration of oral contrast media per Radiology protocol    Answer:   Yes   CBC with Differential (Cancer Center Only)    Standing Status:   Future     Standing Expiration Date:   07/25/2024   CMP (Cancer Center only)    Standing Status:   Future    Standing Expiration Date:   07/25/2024    Follow up 6 months.  Repeat CTscan in 6 months and follow-up after.  The patient understands the plans discussed today and is in agreement with them.  He knows to contact our office if he develops concerns prior to his next appointment.   Rickard Patience, MD      Chief complaints: follow up for esophageal squamous cell carcinoma  History of Present Illness  Oncology History  Prostate cancer (HCC)  10/11/2020 Initial Diagnosis   Prostate cancer (HCC)   05/04/2022 Cancer Staging   Staging form: Prostate, AJCC 8th Edition - Clinical: cT2c, cN0, cM0, Grade Group: 2 - Signed by Rickard Patience, MD on 05/04/2022 Gleason score: 7 Histologic grading system: 5 grade system   Primary esophageal squamous cell carcinoma (HCC)  04/19/2022 Procedure   04/19/2022 EGD by Dr. Mia Creek showed partially obstructive likely malignant esophageal tumor was found at GE junction.  Biopsied.  Normal stomach and normal examined duodenum. Biopsy pathology showed fragments of the squamous mucosa with high-grade dysplastic.  Scant gastric mucosa present with reactive changes and no dysplastic or significant intestinal metaplastic.  Fragments of fibrillar purulent ulcer diabetes also present.  04/27/2022, repeat EGD and a biopsy of GE junction partially obstructed ulcerating mass. Repeat biopsy pathology showed superficial and disaggregated fragments of squamous mucosa with at  least severe dysplastic/squamous cell carcinoma in situ.  Definite invasion cannot be identified.  In part secondary to specimen fragmentation and tangential sectioning.    04/27/2022 Imaging   MRI abdomen MRCP with and without contrast showed bilateral benign adrenal adenoma.  Small hepatic hemangioma and small right hepatic parenchymal vascular anomalies. Small hiatal hernia.  Colonic diverticulosis.   05/04/2022 Initial  Diagnosis   Primary esophageal squamous cell carcinoma  + Stomach discomfort for 4 months, dysphagia for both solids and liquids at times. + Weight loss   05/04/2022 Cancer Staging   Staging form: Esophagus - Squamous Cell Carcinoma, AJCC 8th Edition - Clinical: Stage II (cT3, cN0, cM0, L: Lower) - Signed by Rickard Patience, MD on 05/31/2022 Stage prefix: Initial diagnosis Total positive nodes: 0   05/09/2022 Imaging   PET scan showed intense tracer uptake associated with the distal esophageal mass just above the level of the GE junction compatible with primary esophageal neoplasm. No signs of tracer avid nodal metastasis or solid organ metastasis.   05/20/2022 Procedure   EUS by Dr.Spaete.  T3,Nx 05/20/2022, esophagus mass biopsy showed invasive moderately differentiated squamous cell carcinoma, with basaloid features and minimal keratinization.  Background squamous cell carcinoma in situ.   06/03/2022 -  Chemotherapy   Patient is on Treatment Plan :  ESOPHAGUS Carboplatin/PACLitaxel weekly x 6 weeks with XRT       07/09/2022 - 07/12/2022 Hospital Admission   Hospitalization due to COPD exacerbation.     09/03/2022 Imaging   PET restaging  1. Similar symmetric distal esophageal wall thickening with significant reduction in the abnormal FDG avidity, now with a small focus of minimally metabolic activity, nonspecific and possibly reflecting posttreatment change or residual tumor. Suggest continued attention on follow-up imaging. 2. No evidence of hypermetabolic metastatic disease in the chest, abdomen or pelvis. 3. Aortic Atherosclerosis (ICD10-I70.0) and Emphysema (ICD10-J43.9).   11/08/2022 Procedure   Repeat EGD by Dr. Mia Creek showed  -Non-severe radiation esophagitis with no bleeding. Biopsied.-Pathology showed mild acute esophagitis.  No evidence of Barrett mucosa or dysplasia, no evidence of atypia or malignancy. - Small hiatal hernia. - Normal stomach. - Normal examined duodenum      12/08/2022 Imaging   Repeat PET scan showed interval resolution of residual hypermetabolism within the distal esophagus consistent with response to therapy.  No evidence of local recurrence or metastatic disease.  Stable bilateral adrenal adenomas.  Aortic atherosclerosis.   03/11/2023 Imaging   CT chest abdomen pelvis wo contrast  1. Stable mild to moderate distal esophageal wall thickening without discrete mass. No paraesophageal lymphadenopathy. 2. No findings for metastatic disease involving the chest, abdomen or pelvis. 3. Stable bilateral adrenal gland adenomas.   Aortic Atherosclerosis (ICD10-I70.0) and Emphysema (ICD10-J43.9).     Patient has multiple comorbidities including COPD/chronic respiratory failure, history of STEMI/combined systolic and diastolic CHF [last LVEF 40 to 40%], lung nodules previously avid on PET scan, biopsy negative and was felt to be calcified granulomas.-Pulmonology Dr. [name] Patient lives by himself.  Independent with ADL and some of the iADL  Interval History Leslie Sims is here today for repeat clinical assessment. He denies fevers or chills. He denies pain. His appetite is fair His weight is stable. + Esophageal dysphagia, improved an stable symptoms.  He has lost 7 pounds.  + SOB, stable. Not worse.    Review of Systems  Constitutional:  Negative for appetite change, chills, fatigue, fever and unexpected weight change.  HENT:   Negative for hearing loss and voice change.  Eyes:  Negative for eye problems and icterus.  Respiratory:  Positive for shortness of breath. Negative for chest tightness and cough.        Chronic respiratory failure on oxygen  Cardiovascular:  Negative for leg swelling.  Gastrointestinal:  Negative for abdominal distention, abdominal pain and constipation.  Endocrine: Negative for hot flashes.  Genitourinary:  Negative for difficulty urinating, dysuria and frequency.   Musculoskeletal:  Negative for arthralgias.  Skin:   Negative for itching and rash.  Neurological:  Negative for light-headedness and numbness.  Hematological:  Negative for adenopathy. Does not bruise/bleed easily.  Psychiatric/Behavioral:  Negative for confusion.      VITALS:  Blood pressure 117/60, pulse 95, temperature 98.2 F (36.8 C), resp. rate 18, weight 190 lb 8 oz (86.4 kg), SpO2 97%, peak flow (!) 3 L/min.  Wt Readings from Last 3 Encounters:  07/26/23 190 lb 8 oz (86.4 kg)  07/15/23 193 lb 3.2 oz (87.6 kg)  06/29/23 187 lb (84.8 kg)    Body mass index is 27.33 kg/m.  Performance status (ECOG): 2 - Symptomatic, <50% confined to bed  PHYSICAL EXAM:  Physical Exam Constitutional:      General: He is not in acute distress.    Appearance: He is obese. He is not diaphoretic.  HENT:     Head: Normocephalic and atraumatic.  Eyes:     General: No scleral icterus. Cardiovascular:     Rate and Rhythm: Normal rate.     Heart sounds: No murmur heard. Pulmonary:     Effort: Pulmonary effort is normal. No respiratory distress.     Comments: Patient is on nasal cannula oxygen Abdominal:     General: There is no distension.     Palpations: Abdomen is soft.  Musculoskeletal:        General: Normal range of motion.     Cervical back: Normal range of motion.  Skin:    Findings: No erythema.  Neurological:     Mental Status: He is alert and oriented to person, place, and time. Mental status is at baseline.     Motor: No abnormal muscle tone.  Psychiatric:        Mood and Affect: Mood and affect normal.      LABS:      Latest Ref Rng & Units 07/22/2023    9:59 AM 07/15/2023    1:40 PM 05/28/2023   10:18 AM  CBC  WBC 4.0 - 10.5 K/uL 6.1  5.8  6.8   Hemoglobin 13.0 - 17.0 g/dL 16.1  09.6  04.5   Hematocrit 39.0 - 52.0 % 41.6  41.2  41.6   Platelets 150 - 400 K/uL 263  228  196       Latest Ref Rng & Units 07/22/2023    9:59 AM 07/15/2023    1:40 PM 06/27/2023    4:49 PM  CMP  Glucose 70 - 99 mg/dL 409  96  811   BUN 8 -  23 mg/dL 15  15  18    Creatinine 0.61 - 1.24 mg/dL 9.14  7.82  9.56   Sodium 135 - 145 mmol/L 141  141  138   Potassium 3.5 - 5.1 mmol/L 3.5  3.8  3.6   Chloride 98 - 111 mmol/L 103  104  101   CO2 22 - 32 mmol/L 30  29  27    Calcium 8.9 - 10.3 mg/dL 8.5  8.7  8.7   Total Protein 6.5 - 8.1 g/dL 6.3  6.1    Total Bilirubin 0.3 - 1.2 mg/dL 0.4  0.3    Alkaline Phos 38 - 126 U/L 60  62    AST 15 - 41 U/L 20  21    ALT 0 - 44 U/L 26  26       STUDIES:  CT CHEST ABDOMEN PELVIS W CONTRAST  Result Date: 07/25/2023 CLINICAL DATA:  Follow-up of esophageal cancer. No recent therapy or surgery. Prior prostate carcinoma. * Tracking Code: BO * EXAM: CT CHEST, ABDOMEN, AND PELVIS WITH CONTRAST TECHNIQUE: Multidetector CT imaging of the chest, abdomen and pelvis was performed following the standard protocol during bolus administration of intravenous contrast. RADIATION DOSE REDUCTION: This exam was performed according to the departmental dose-optimization program which includes automated exposure control, adjustment of the mA and/or kV according to patient size and/or use of iterative reconstruction technique. CONTRAST:  OMNIPAQUE IOHEXOL 300 MG/ML  SOLN COMPARISON:  03/11/2023 FINDINGS: CT CHEST FINDINGS Cardiovascular: Aortic atherosclerosis. Normal heart size, without pericardial effusion. Left apical ventricular hypoattenuation likely represents remote infarct. Lad coronary artery calcification. No central pulmonary embolism, on this non-dedicated study. Mediastinum/Nodes: No supraclavicular adenopathy. No mediastinal or hilar adenopathy. The mid esophagus is mildly dilated and fluid-filled. Again identified is mild wall thickening involving the distal esophagus including on 54/2. No well-defined mass. Lungs/Pleura: No pleural fluid.  Moderate centrilobular emphysema. Probable secretions along the anterior right side of the trachea. Right upper lobe scarring is similar. There is a mild focal thickening  posteriorly on 41/4 and more anteriorly on 47/4 unchanged. Left upper lobe centrally calcified 10 mm nodule on 50/4 is unchanged, likely a granuloma. Probable aspirated contrast in the lateral right lower lobe, similar. Superior segment left lower lobe calcified granuloma. No pulmonary metastasis. Musculoskeletal: No acute osseous abnormality. CT ABDOMEN PELVIS FINDINGS Hepatobiliary: Normal liver. Normal gallbladder. Common duct measures up to 12 mm in the porta hepatis on 67/2, similar. Tapers in the region of the pancreatic head without obstructive stone or mass. Similar back to 10/19/2018. Pancreas: Normal, without mass or ductal dilatation. Spleen: Old granulomatous disease within. Adrenals/Urinary Tract: Left larger than right adrenal nodules are unchanged including at up to 2.4 cm. Present and similar back to 10/19/2018. Normal kidneys, without hydronephrosis.  Normal urinary bladder. Stomach/Bowel: Normal stomach, without wall thickening. Normal colon and terminal ileum. Normal appendix. Vascular/Lymphatic: Aortic atherosclerosis. No abdominopelvic adenopathy. Reproductive: Radiation seeds in the prostate. Penile prosthesis with deflated pump. Other: No significant free fluid. Small fat containing right inguinal hernia. No evidence of omental or peritoneal disease. Musculoskeletal: No acute osseous abnormality. IMPRESSION: 1. Similar mild distal esophageal wall thickening, without well-defined mass. No evidence of metastatic disease. 2. Esophageal air fluid level suggests dysmotility or gastroesophageal reflux. 3. Bilateral adrenal nodules, similar back to 2019 and presumably adenomas. No specific follow-up indicated. 4. Mild common duct dilatation which is unchanged back to at least 2019 and most likely within normal variation. This could be correlated with bilirubin levels. 5. Incidental findings, including: Aortic atherosclerosis (ICD10-I70.0), coronary artery atherosclerosis and emphysema (ICD10-J43.9).  Electronically Signed   By: Jeronimo Greaves M.D.   On: 07/25/2023 11:43   DG Chest Portable 1 View  Result Date: 07/15/2023 CLINICAL DATA:  sob EXAM: PORTABLE CHEST 1 VIEW COMPARISON:  05/28/2023. FINDINGS: Redemonstration of mild central and basilar pulmonary vascular congestion/edema. Bilateral lung fields are otherwise clear. Bilateral costophrenic angles are clear. Stable cardio-mediastinal silhouette. No acute osseous abnormalities. Old/healed fracture of posterior left seventh rib again seen. The soft tissues  are within normal limits. IMPRESSION: 1. Mild central and basilar pulmonary vascular congestion/edema. Electronically Signed   By: Jules Schick M.D.   On: 07/15/2023 14:38   DG Chest 1 View  Result Date: 05/28/2023 CLINICAL DATA:  Shortness of breath. EXAM: CHEST  1 VIEW COMPARISON:  05/17/2023. FINDINGS: New pulmonary venous congestion and basilar predominant reticular opacities, consistent with mild pulmonary edema. No pleural effusion or pneumothorax. Stable cardiac and mediastinal contours. IMPRESSION: New mild pulmonary edema. Electronically Signed   By: Orvan Falconer M.D.   On: 05/28/2023 11:28   DG Chest 2 View  Result Date: 05/17/2023 CLINICAL DATA:  Shortness of breath, chest pain EXAM: CHEST - 2 VIEW COMPARISON:  Previous studies including the examination of 05/08/2023 FINDINGS: Cardiac size is within normal limits. Increase in the AP diameter of the chest suggests COPD. There are no signs of pulmonary edema or focal pulmonary consolidation. There is no pleural effusion or pneumothorax. Faint nodular density in left upper lung field has not changed. IMPRESSION: There are no new infiltrates or signs of pulmonary edema. Electronically Signed   By: Ernie Avena M.D.   On: 05/17/2023 10:19   DG Chest 2 View  Result Date: 05/08/2023 CLINICAL DATA:  COPD EXAM: CHEST - 2 VIEW COMPARISON:  04/24/2023, 03/11/2023 FINDINGS: The heart size and mediastinal contours are within normal  limits. Left upper lobe lung nodule corresponding to partially calcified nodule seen on recent CT. Hyperinflated lungs. No focal airspace consolidation, pleural effusion, or pneumothorax. The visualized skeletal structures are unremarkable. IMPRESSION: 1. COPD.  No active cardiopulmonary disease. 2. Left upper lobe lung nodule corresponding to partially calcified nodule seen on recent CT. Electronically Signed   By: Duanne Guess D.O.   On: 05/08/2023 10:11   ECHOCARDIOGRAM COMPLETE  Result Date: 05/02/2023    ECHOCARDIOGRAM REPORT   Patient Name:   NICKLOUS FLORCZAK [facility] Date of Exam: 05/02/2023 Medical Rec #:  [id number]           Height:       70.0 in Accession #:    [id number]          Weight:       193.0 lb Date of Birth:  March 26, 1951          BSA:          2.056 m Patient Age:    72 years            BP:           141/81 mmHg Patient Gender: M                   HR:           75 bpm. Exam Location:  [facility] Procedure: 2D Echo, Cardiac Doppler, Color Doppler and Intracardiac            Opacification Agent Indications:     CHF  History:         Patient has prior history of Echocardiogram examinations, most                  recent 09/09/2022. CHF, CAD, COPD, Arrythmias:Bradycardia,                  Signs/Symptoms:Chest Pain; Risk Factors:Hypertension,                  Dyslipidemia and Former Smoker. S/P Chemotherapy.  Sonographer:     Mikki Harbor Referring Phys:  [phone] Eliot Ford [facility] Diagnosing Phys: Jerolyn Center  Arida MD IMPRESSIONS  1. Left ventricular ejection fraction, by estimation, is 35 to 40%. The left ventricle has moderately decreased function. The left ventricle demonstrates regional wall motion abnormalities (see scoring diagram/findings for description). The left ventricular internal cavity size was mildly dilated. There is mild left ventricular hypertrophy. Left ventricular diastolic parameters are consistent with Grade I diastolic dysfunction (impaired relaxation). There is severe hypokinesis of the  left ventricular, mid-apical anterior wall, anterolateral wall and apical segment.  2. Right ventricular systolic function is normal. The right ventricular size is normal. Tricuspid regurgitation signal is inadequate for assessing PA pressure.  3. Left atrial size was mildly dilated.  4. The mitral valve is normal in structure. No evidence of mitral valve regurgitation. No evidence of mitral stenosis.  5. The aortic valve is normal in structure. Aortic valve regurgitation is not visualized. No aortic stenosis is present.  6. Suboptimal image quality. FINDINGS  Left Ventricle: Left ventricular ejection fraction, by estimation, is 35 to 40%. The left ventricle has moderately decreased function. The left ventricle demonstrates regional wall motion abnormalities. Severe hypokinesis of the left ventricular, mid-apical anterior wall, anterolateral wall and apical segment. Definity contrast agent was given IV to delineate the left ventricular endocardial borders. The left ventricular internal cavity size was mildly dilated. There is mild left ventricular hypertrophy. Left ventricular diastolic parameters are consistent with Grade I diastolic dysfunction (impaired relaxation). Right Ventricle: The right ventricular size is normal. No increase in right ventricular wall thickness. Right ventricular systolic function is normal. Tricuspid regurgitation signal is inadequate for assessing PA pressure. Left Atrium: Left atrial size was mildly dilated. Right Atrium: Right atrial size was normal in size. Pericardium: There is no evidence of pericardial effusion. Mitral Valve: The mitral valve is normal in structure. No evidence of mitral valve regurgitation. No evidence of mitral valve stenosis. MV peak gradient, 3.3 mmHg. The mean mitral valve gradient is 1.0 mmHg. Tricuspid Valve: The tricuspid valve is normal in structure. Tricuspid valve regurgitation is not demonstrated. No evidence of tricuspid stenosis. Aortic Valve: The  aortic valve is normal in structure. Aortic valve regurgitation is not visualized. No aortic stenosis is present. Aortic valve mean gradient measures 3.0 mmHg. Aortic valve peak gradient measures 5.6 mmHg. Aortic valve area, by VTI measures 2.77 cm. Pulmonic Valve: The pulmonic valve was normal in structure. Pulmonic valve regurgitation is not visualized. No evidence of pulmonic stenosis. Aorta: The aortic root is normal in size and structure. Venous: The inferior vena cava was not well visualized. IAS/Shunts: No atrial level shunt detected by color flow Doppler.  LEFT VENTRICLE PLAX 2D LVIDd:         5.70 cm   Diastology LVIDs:         4.00 cm   LV e' medial:    5.87 cm/s LV PW:         1.20 cm   LV E/e' medial:  8.5 LV IVS:        1.30 cm   LV e' lateral:   7.94 cm/s LVOT diam:     1.90 cm   LV E/e' lateral: 6.3 LV SV:         64 LV SV Index:   31 LVOT Area:     2.84 cm  RIGHT VENTRICLE RV Basal diam:  3.50 cm RV Mid diam:    3.20 cm RV S prime:     11.30 cm/s TAPSE (M-mode): 2.2 cm LEFT ATRIUM  Index        RIGHT ATRIUM           Index LA diam:      3.50 cm 1.70 cm/m   RA Area:     17.30 cm LA Vol (A2C): 35.9 ml 17.46 ml/m  RA Volume:   48.60 ml  23.64 ml/m LA Vol (A4C): 66.9 ml 32.54 ml/m  AORTIC VALVE                    PULMONIC VALVE AV Area (Vmax):    2.62 cm     PV Vmax:       1.05 m/s AV Area (Vmean):   2.49 cm     PV Peak grad:  4.4 mmHg AV Area (VTI):     2.77 cm AV Vmax:           118.00 cm/s AV Vmean:          75.000 cm/s AV VTI:            0.230 m AV Peak Grad:      5.6 mmHg AV Mean Grad:      3.0 mmHg LVOT Vmax:         109.00 cm/s LVOT Vmean:        65.900 cm/s LVOT VTI:          0.225 m LVOT/AV VTI ratio: 0.98  AORTA Ao Root diam: 3.70 cm MITRAL VALVE MV Area (PHT): 3.50 cm    SHUNTS MV Area VTI:   2.61 cm    Systemic VTI:  0.22 m MV Peak grad:  3.3 mmHg    Systemic Diam: 1.90 cm MV Mean grad:  1.0 mmHg MV Vmax:       0.91 m/s MV Vmean:      53.3 cm/s MV Decel Time: 217 msec MV  E velocity: 49.80 cm/s MV A velocity: 71.80 cm/s MV E/A ratio:  0.69 Lorine Bears MD Electronically signed by Lorine Bears MD Signature Date/Time: 05/02/2023/1:28:09 PM    Final       Past Medical History:  Diagnosis Date   Allergic rhinitis    Anxiety    Back pain    Cancer (HCC)    prostate   CHF (congestive heart failure) (HCC)    Colon polyps 08/2012   colonoscopy; multiple colon polyps; repeat colonoscopy in one year.   Complication of anesthesia    COPD (chronic obstructive pulmonary disease) (HCC)    a. on home O2.   Coronary artery disease 11/2009   a. late presenting ant MI; b. LHC 100% pLAD s/p PCI/DES, 99% mRCA s/p PCI/DES, EF 35%; c. nuclear stress test 05/13: prior ant/inf infarcts w/o ischemia, EF 43%; d. LHC 02/14: widely patent stents with no other obs dz, EF 40%; e. LHC 11/17: LM nl, mLAD 10%, patent LAD stent, dLAD 20%, p-mRCA 10%, mRCA 40%, patent RCA stent, EF 35-45%    Depression    Diabetes (HCC)    Dysphagia    Family history of adverse reaction to anesthesia    GERD (gastroesophageal reflux disease)    Headache(784.0)    Helicobacter pylori (H. pylori)    Hemorrhoids    Hernia    inguinal   HFrEF (heart failure with reduced ejection fraction) (HCC)    a. 10/2016 LV gram: EF 35-45%; b. 06/2018 Echo: EF 40-45%. c. 01/2021 Echo: EF 40-45%   Hyperlipidemia    Hypertension    Insomnia    Ischemic cardiomyopathy  a. 10/2016 LV gram: EF 35-45%; b. 06/2018 Echo: EF 40-45%.   Myalgia    Oxygen dependent    Peptic ulcer    Pulmonary nodules    ST elevation (STEMI) myocardial infarction involving left anterior descending coronary artery (HCC) 11/2009   a. s/p PCI to the LAD   Status post dilation of esophageal narrowing 2000   Thickening of esophagus    Tinea pedis    Wears dentures    partial upper    Past Surgical History:  Procedure Laterality Date    coronary stents     Admission  12/20/2012   COPD exacerbation.  [facility].   CARDIAC CATHETERIZATION      CARDIAC CATHETERIZATION  02/05/2013   [facility]   CARDIAC CATHETERIZATION  09/19/2013   [facility] : patent stents with no change in anatomy. EF: 40$   CARDIAC CATHETERIZATION Left 11/12/2016   Procedure: Left Heart Cath and Coronary Angiography;  Surgeon: Iran Ouch, MD;  Location: [facility] INVASIVE CV LAB;  Service: Cardiovascular;  Laterality: Left;   COLONOSCOPY     COLONOSCOPY WITH PROPOFOL N/A 05/18/2016   Procedure: COLONOSCOPY WITH PROPOFOL;  Surgeon: Midge Minium, MD;  Location: [facility] ENDOSCOPY;  Service: Endoscopy;  Laterality: N/A;   COLONOSCOPY WITH PROPOFOL N/A 03/19/2020   Procedure: COLONOSCOPY WITH PROPOFOL;  Surgeon: Toney Reil, MD;  Location: [facility] [facility];  Service: Gastroenterology;  Laterality: N/A;   CORONARY ANGIOPLASTY WITH STENT PLACEMENT  09/19/2009   LAD 3.0 X23 mm Xience DES, RCA: 4.0 X 15 mm Xience DES   ELECTROMAGNETIC NAVIGATION BROCHOSCOPY N/A 10/27/2017   Procedure: ELECTROMAGNETIC NAVIGATION BRONCHOSCOPY;  Surgeon: Erin Fulling, MD;  Location: [facility];  Service: Cardiopulmonary;  Laterality: N/A;   ESOPHAGEAL DILATION     ESOPHAGOGASTRODUODENOSCOPY  12/20/2006   ESOPHAGOGASTRODUODENOSCOPY  08/20/2012   ESOPHAGOGASTRODUODENOSCOPY (EGD) WITH PROPOFOL N/A 04/19/2022   Procedure: ESOPHAGOGASTRODUODENOSCOPY (EGD) WITH PROPOFOL;  Surgeon: Regis Bill, MD;  Location: [facility];  Service: Endoscopy;  Laterality: N/A;   ESOPHAGOGASTRODUODENOSCOPY (EGD) WITH PROPOFOL N/A 04/27/2022   Procedure: ESOPHAGOGASTRODUODENOSCOPY (EGD) WITH PROPOFOL;  Surgeon: Regis Bill, MD;  Location: [facility];  Service: Endoscopy;  Laterality: N/A;   ESOPHAGOGASTRODUODENOSCOPY (EGD) WITH PROPOFOL N/A 11/08/2022   Procedure: ESOPHAGOGASTRODUODENOSCOPY (EGD) WITH PROPOFOL;  Surgeon: Regis Bill, MD;  Location: [facility];  Service: Endoscopy;  Laterality: N/A;   EUS N/A 05/20/2022   Procedure: UPPER ENDOSCOPIC ULTRASOUND (EUS) LINEAR;  Surgeon:  Doren Custard, MD;  Location: [facility];  Service: Gastroenterology;  Laterality: N/A;  [facility]   HERNIA REPAIR  07/20/2012   L inguinal hernia repair   PENILE PROSTHESIS IMPLANT     RIGHT/LEFT HEART CATH AND CORONARY ANGIOGRAPHY Bilateral 04/04/2023   Procedure: RIGHT/LEFT HEART CATH AND CORONARY ANGIOGRAPHY;  Surgeon: Iran Ouch, MD;  Location: [facility];  Service: Cardiovascular;  Laterality: Bilateral;    Family History  Problem Relation Age of Onset   Heart attack Brother        Brother #1   Diabetes Brother    Hypertension Brother        #3   Coronary artery disease Father 75       deceased   Heart attack Father    Diabetes Father    Heart disease Father    COPD Mother 53       deceased   Alcohol abuse Sister        polysubstance abuse   COPD Sister    Lung cancer Sister  Alcohol abuse Sister        polysubstance abuse   Penile cancer Brother    Diabetes Brother    Prostate cancer Neg Hx    Bladder Cancer Neg Hx    Kidney cancer Neg Hx     Social History:  reports that he quit smoking about 4 years ago. His smoking use included cigarettes. He started smoking about 46 years ago. He has a 126 pack-year smoking history. He has never used smokeless tobacco. He reports that he does not drink alcohol and does not use drugs.  Allergies:  Allergies  Allergen Reactions   Prozac [Fluoxetine Hcl] Shortness Of Breath   Hydroxyzine    Bupropion Other (See Comments)    "Makes me feel funny" Other reaction(s): Other (See Comments) Unknown/"made me feel real funny" "Makes me feel funny" "Makes me feel funny" Other reaction(s): Other (See Comments) Unknown/"made me feel real funny" "Makes me feel funny"   Effexor Xr [Venlafaxine Hcl Er] Other (See Comments)    "Makes me feel funny"    Current Medications: Current Outpatient Medications  Medication Sig Dispense Refill   albuterol (VENTOLIN HFA) 108 (90 Base) MCG/ACT inhaler TAKE 2 PUFFS BY MOUTH  EVERY 6 HOURS AS NEEDED FOR WHEEZE OR SHORTNESS OF BREATH 18 each 2   ALPRAZolam (XANAX) 0.5 MG tablet Take 0.5 mg by mouth 3 (three) times daily.     aspirin 81 MG tablet Take 81 mg by mouth daily.     atorvastatin (LIPITOR) 80 MG tablet Take 1 tablet (80 mg total) by mouth daily. 90 tablet 3   azithromycin (ZITHROMAX) 250 MG tablet Take 250 mg by mouth 3 (three) times a week.     budesonide (PULMICORT) 0.5 MG/2ML nebulizer solution Take 2 mLs (0.5 mg total) by nebulization in the morning and at bedtime. 360 mL 10   dapagliflozin propanediol (FARXIGA) 10 MG TABS tablet Take 1 tablet (10 mg total) by mouth daily before breakfast. 30 tablet 6   fluticasone (FLONASE) 50 MCG/ACT nasal spray Place 2 sprays into both nostrils daily.     furosemide (LASIX) 20 MG tablet Take 1 tablet (20 mg total) by mouth as needed. 30 tablet 1   ipratropium-albuterol (DUONEB) 0.5-2.5 (3) MG/3ML SOLN Take 3 mLs by nebulization 2 (two) times daily.     ipratropium-albuterol (DUONEB) 0.5-2.5 (3) MG/3ML SOLN Take 3 mLs by nebulization every 6 (six) hours as needed. 360 mL 11   isosorbide mononitrate (IMDUR) 30 MG 24 hr tablet Take 1 tablet (30 mg total) by mouth daily. 90 tablet 3   ketoconazole (NIZORAL) 2 % cream Apply topically 2 (two) times daily.     loratadine (CLARITIN) 10 MG tablet Take 10 mg by mouth daily.     mirtazapine (REMERON) 45 MG tablet Take 45 mg by mouth at bedtime.     Multiple Vitamin (MULTIVITAMIN) capsule Take 1 capsule by mouth daily.     nitroGLYCERIN (NITROSTAT) 0.4 MG SL tablet Place 1 tablet (0.4 mg total) under the tongue every 5 (five) minutes as needed. 25 tablet 3   OXYGEN Inhale 3 L into the lungs daily.     pantoprazole (PROTONIX) 40 MG tablet Take 1 tablet (40 mg total) by mouth 2 (two) times daily before a meal. 60 tablet 3   sacubitril-valsartan (ENTRESTO) 24-26 MG Take 1 tablet by mouth 2 (two) times daily. 60 tablet 6   Spacer/Aero-Holding Chambers (AEROCHAMBER MV) inhaler Use as  instructed 1 each 2   spironolactone (ALDACTONE) 25 MG  tablet Take 0.5 tablets (12.5 mg total) by mouth every evening. 30 tablet 6   No current facility-administered medications for this visit.

## 2023-07-26 NOTE — Assessment & Plan Note (Signed)
Continue follow up with pulmonology

## 2023-07-26 NOTE — Assessment & Plan Note (Addendum)
Gleason score 3+4, status post definitive radiation. Has not followed up with urology Dr. Richardo Hanks.   PSA is stable.

## 2023-07-26 NOTE — Assessment & Plan Note (Addendum)
He is on concurrent chemotherapy carboplatin/Taxol weekly with radiation. S/p concurrent chemotherapy and radiation [finished 07/26/22] Repeat EGD showed no evidence of malignancy. PET restaging- CR CT scan showed no evidence of recurrence.  Recommend continued surveillance CT scan, next in 6 months

## 2023-07-27 ENCOUNTER — Encounter: Payer: Self-pay | Admitting: *Deleted

## 2023-07-27 DIAGNOSIS — J449 Chronic obstructive pulmonary disease, unspecified: Secondary | ICD-10-CM

## 2023-07-27 NOTE — Progress Notes (Signed)
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)  Patient's Home Medications on Admission:  Current Outpatient Medications:    albuterol (VENTOLIN HFA) 108 (90 Base) MCG/ACT inhaler, TAKE 2 PUFFS BY MOUTH EVERY 6 HOURS AS NEEDED FOR WHEEZE OR SHORTNESS OF BREATH, Disp: 18 each, Rfl: 2   ALPRAZolam (XANAX) 0.5 MG tablet, Take 0.5 mg by mouth 3 (three) times daily., Disp: , Rfl:    aspirin 81 MG tablet, Take 81 mg by mouth daily., Disp: , Rfl:    atorvastatin (LIPITOR) 80 MG tablet, Take 1 tablet (80 mg total) by mouth daily., Disp: 90 tablet, Rfl: 3   azithromycin (ZITHROMAX) 250 MG tablet, Take 250 mg by mouth 3 (three) times a week., Disp: , Rfl:    budesonide (PULMICORT) 0.5 MG/2ML nebulizer solution, Take 2 mLs (0.5 mg total) by nebulization in the morning and at bedtime., Disp: 360 mL, Rfl: 10   dapagliflozin propanediol (FARXIGA) 10 MG TABS tablet, Take 1 tablet (10 mg total) by mouth daily before breakfast., Disp: 30 tablet, Rfl: 6   fluticasone (FLONASE) 50 MCG/ACT nasal spray, Place 2 sprays into both nostrils daily., Disp: , Rfl:    furosemide (LASIX) 20 MG tablet, Take 1 tablet (20 mg total) by mouth as needed., Disp: 30 tablet, Rfl: 1   ipratropium-albuterol (DUONEB) 0.5-2.5 (3) MG/3ML SOLN, Take 3 mLs by nebulization 2 (two) times daily., Disp: , Rfl:    ipratropium-albuterol (DUONEB) 0.5-2.5 (3) MG/3ML SOLN, Take 3 mLs by nebulization every 6 (six) hours as needed., Disp: 360 mL, Rfl: 11   isosorbide mononitrate (IMDUR) 30 MG 24 hr tablet, Take 1 tablet (30 mg total) by  mouth daily., Disp: 90 tablet, Rfl: 3   ketoconazole (NIZORAL) 2 % cream, Apply topically 2 (two) times daily., Disp: , Rfl:    loratadine (CLARITIN) 10 MG tablet, Take 10 mg by mouth daily., Disp: , Rfl:    mirtazapine (REMERON) 45 MG tablet, Take 45 mg by mouth at bedtime., Disp: , Rfl:    Multiple Vitamin (MULTIVITAMIN) capsule, Take 1 capsule by mouth daily., Disp: , Rfl:    nitroGLYCERIN (NITROSTAT) 0.4 MG SL tablet, Place 1 tablet (0.4 mg total) under the tongue every 5 (five) minutes as needed., Disp: 25 tablet, Rfl: 3   OXYGEN, Inhale 3 L into the lungs daily., Disp: , Rfl:    pantoprazole (PROTONIX) 40 MG tablet, Take 1 tablet (40 mg total) by mouth 2 (two) times daily before a meal., Disp: 60 tablet, Rfl: 3   sacubitril-valsartan (ENTRESTO) 24-26 MG, Take 1 tablet by mouth 2 (two) times daily., Disp: 60 tablet, Rfl: 6   Spacer/Aero-Holding Chambers (AEROCHAMBER MV) inhaler, Use as instructed, Disp: 1 each, Rfl: 2   spironolactone (ALDACTONE) 25 MG tablet, Take 0.5 tablets (12.5 mg total) by mouth every evening., Disp: 30 tablet, Rfl: 6  Past Medical History: Past Medical History:  Diagnosis Date   Allergic rhinitis    Anxiety    Back pain    Cancer (HCC)    prostate   CHF (congestive heart failure) (HCC)    Colon  polyps 08/2012   colonoscopy; multiple colon polyps; repeat colonoscopy in one year.   Complication of anesthesia    COPD (chronic obstructive pulmonary disease) (HCC)    a. on home O2.   Coronary artery disease 11/2009   a. late presenting ant MI; b. LHC 100% pLAD s/p PCI/DES, 99% mRCA s/p PCI/DES, EF 35%; c. nuclear stress test 05/13: prior ant/inf infarcts w/o ischemia, EF 43%; d. LHC 02/14: widely patent stents with no other obs dz, EF 40%; e. LHC 11/17: LM nl, mLAD 10%, patent LAD stent, dLAD 20%, p-mRCA 10%, mRCA 40%, patent RCA stent, EF 35-45%    Depression    Diabetes (HCC)    Dysphagia    Family history of adverse reaction to anesthesia    GERD  (gastroesophageal reflux disease)    Headache(784.0)    Helicobacter pylori (H. pylori)    Hemorrhoids    Hernia    inguinal   HFrEF (heart failure with reduced ejection fraction) (HCC)    a. 10/2016 LV gram: EF 35-45%; b. 06/2018 Echo: EF 40-45%. c. 01/2021 Echo: EF 40-45%   Hyperlipidemia    Hypertension    Insomnia    Ischemic cardiomyopathy    a. 10/2016 LV gram: EF 35-45%; b. 06/2018 Echo: EF 40-45%.   Myalgia    Oxygen dependent    Peptic ulcer    Pulmonary nodules    ST elevation (STEMI) myocardial infarction involving left anterior descending coronary artery (HCC) 11/2009   a. s/p PCI to the LAD   Status post dilation of esophageal narrowing 2000   Thickening of esophagus    Tinea pedis    Wears dentures    partial upper    Tobacco Use: Social History   Tobacco Use  Smoking Status Former   Current packs/day: 0.00   Average packs/day: 3.0 packs/day for 42.0 years (126.0 ttl pk-yrs)   Types: Cigarettes   Start date: 02/10/1977   Quit date: 02/10/2019   Years since quitting: 4.4  Smokeless Tobacco Never  Tobacco Comments   25 months ago most smoked 4 packs a day .    Labs: Review Flowsheet  More data exists      Latest Ref Rng & Units 03/18/2021 01/26/2023 04/04/2023 06/27/2023 07/07/2023  Labs for ITP Cardiac and Pulmonary Rehab  Cholestrol 0 - 200 mg/dL 595  638  - 756  433   LDL (calc) 0 - 99 mg/dL 81  47  - 48  58   HDL-C >40 mg/dL 69  87  - 48  46   Trlycerides <150 mg/dL 77  295  - 188  416   PH, Arterial 7.35 - 7.45 - - 7.317  - -  PCO2 arterial 32 - 48 mmHg - - 66.4  - -  Bicarbonate 20.0 - 28.0 mmol/L - - 34.0  29.2  - -  TCO2 22 - 32 mmol/L - - 36  31  - -  O2 Saturation % - - 96  70  - -    Details       Multiple values from one day are sorted in reverse-chronological order          Pulmonary Assessment Scores:   UCSD: Self-administered rating of dyspnea associated with activities of daily living (ADLs) 6-point scale (0 = "not at all" to 5 =  "maximal or unable to do because of breathlessness")  Scoring Scores range from 0 to 120.  Minimally important difference is 5 units  CAT: CAT can identify  the health impairment of COPD patients and is better correlated with disease progression.  CAT has a scoring range of zero to 40. The CAT score is classified into four groups of low (less than 10), medium (10 - 20), high (21-30) and very high (31-40) based on the impact level of disease on health status. A CAT score over 10 suggests significant symptoms.  A worsening CAT score could be explained by an exacerbation, poor medication adherence, poor inhaler technique, or progression of COPD or comorbid conditions.  CAT MCID is 2 points  mMRC: mMRC (Modified Medical Research Council) Dyspnea Scale is used to assess the degree of baseline functional disability in patients of respiratory disease due to dyspnea. No minimal important difference is established. A decrease in score of 1 point or greater is considered a positive change.   Pulmonary Function Assessment:   Exercise Target Goals: Exercise Program Goal: Individual exercise prescription set using results from initial 6 min walk test and THRR while considering  patient's activity barriers and safety.   Exercise Prescription Goal: Initial exercise prescription builds to 30-45 minutes a day of aerobic activity, 2-3 days per week.  Home exercise guidelines will be given to patient during program as part of exercise prescription that the participant will acknowledge.  Education: Aerobic Exercise: - Group verbal and visual presentation on the components of exercise prescription. Introduces F.I.T.T principle from ACSM for exercise prescriptions.  Reviews F.I.T.T. principles of aerobic exercise including progression. Written material given at graduation.   Education: Resistance Exercise: - Group verbal and visual presentation on the components of exercise prescription. Introduces F.I.T.T  principle from ACSM for exercise prescriptions  Reviews F.I.T.T. principles of resistance exercise including progression. Written material given at graduation.    Education: Exercise & Equipment Safety: - Individual verbal instruction and demonstration of equipment use and safety with use of the equipment. Flowsheet Row Pulmonary Rehab from 05/04/2023 in [facility] Cardiac and Pulmonary Rehab  Date 03/28/23  Educator [facility]  Instruction Review Code 1- Verbalizes Understanding       Education: Exercise Physiology & General Exercise Guidelines: - Group verbal and written instruction with models to review the exercise physiology of the cardiovascular system and associated critical values. Provides general exercise guidelines with specific guidelines to those with heart or lung disease.  Flowsheet Row Pulmonary Rehab from 05/04/2023 in [facility] Cardiac and Pulmonary Rehab  Education need identified 03/28/23       Education: Flexibility, Balance, Mind/Body Relaxation: - Group verbal and visual presentation with interactive activity on the components of exercise prescription. Introduces F.I.T.T principle from ACSM for exercise prescriptions. Reviews F.I.T.T. principles of flexibility and balance exercise training including progression. Also discusses the mind body connection.  Reviews various relaxation techniques to help reduce and manage stress (i.e. Deep breathing, progressive muscle relaxation, and visualization). Balance handout provided to take home. Written material given at graduation.   Activity Barriers & Risk Stratification:   6 Minute Walk:  Oxygen Initial Assessment:   Oxygen Re-Evaluation:   Oxygen Discharge (Final Oxygen Re-Evaluation):   Initial Exercise Prescription:   Perform Capillary Blood Glucose checks as needed.  Exercise Prescription Changes:   Exercise Prescription Changes     Row Name 06/08/23 1600             Response to Exercise   Blood Pressure (Admit)  100/62       Blood Pressure (Exit) 110/68       Heart Rate (Admit) 97 bpm       Heart  Rate (Exercise) 116 bpm       Heart Rate (Exit) 115 bpm       Oxygen Saturation (Admit) 93 %       Oxygen Saturation (Exercise) 88 %       Oxygen Saturation (Exit) 93 %       Rating of Perceived Exertion (Exercise) 12       Perceived Dyspnea (Exercise) 3       Symptoms SOB       Duration Continue with 30 min of aerobic exercise without signs/symptoms of physical distress.       Intensity THRR unchanged         Progression   Progression Continue to progress workloads to maintain intensity without signs/symptoms of physical distress.       Average METs 2.81         Resistance Training   Training Prescription Yes       Weight 5 lb       Reps 10-15         Interval Training   Interval Training No         Oxygen   Oxygen Continuous       Liters 4         Treadmill   MPH 1.5       Grade 0       Minutes 15       METs 2.15         Recumbant Bike   Level 5       Watts 40       Minutes 15         Arm Ergometer   Level 2.5       Minutes 15       METs 2.1         REL-XR   Level 1       Minutes 15       METs 3.4         T5 Nustep   Level 5       Minutes 15       METs 2.4         Oxygen   Maintain Oxygen Saturation 88% or higher                Exercise Comments:   Exercise Goals and Review:   Exercise Goals Re-Evaluation :  Exercise Goals Re-Evaluation     Row Name 06/08/23 1613 06/21/23 1528 07/07/23 1426         Exercise Goal Re-Evaluation   Exercise Goals Review Increase Physical Activity;Increase Strength and Stamina;Understanding of Exercise Prescription Increase Physical Activity;Increase Strength and Stamina;Understanding of Exercise Prescription Increase Physical Activity;Increase Strength and Stamina;Understanding of Exercise Prescription     Comments Tiam is doing well in the program. He recently increased his overall average MET level to 2.81 METs. He  also increased his treadmill workload to a speed of 1.5 mph with no incline, and improved to level 5 on both the recumbent bike and T5 nustep. We will continue to monitor his progress in the program. [place] has been placed on a medical hold until August. We will continue to monitor his progress once he returns to the program. Giuliano continues to be on a medical hold until August. We will continue to monitor his progress once he returns to the program.     Expected Outcomes Short: Try level 2 on the XR. Long: Continue to improve strength and stamina. Short: Return to rehab  when appropriate. Long: Continue to improve stamina. Short: Return to rehab when appropriate. Long: Continue to improve stamina.              Discharge Exercise Prescription (Final Exercise Prescription Changes):  Exercise Prescription Changes - 06/08/23 1600       Response to Exercise   Blood Pressure (Admit) 100/62    Blood Pressure (Exit) 110/68    Heart Rate (Admit) 97 bpm    Heart Rate (Exercise) 116 bpm    Heart Rate (Exit) 115 bpm    Oxygen Saturation (Admit) 93 %    Oxygen Saturation (Exercise) 88 %    Oxygen Saturation (Exit) 93 %    Rating of Perceived Exertion (Exercise) 12    Perceived Dyspnea (Exercise) 3    Symptoms SOB    Duration Continue with 30 min of aerobic exercise without signs/symptoms of physical distress.    Intensity THRR unchanged      Progression   Progression Continue to progress workloads to maintain intensity without signs/symptoms of physical distress.    Average METs 2.81      Resistance Training   Training Prescription Yes    Weight 5 lb    Reps 10-15      Interval Training   Interval Training No      Oxygen   Oxygen Continuous    Liters 4      Treadmill   MPH 1.5    Grade 0    Minutes 15    METs 2.15      Recumbant Bike   Level 5    Watts 40    Minutes 15      Arm Ergometer   Level 2.5    Minutes 15    METs 2.1      REL-XR   Level 1    Minutes 15     METs 3.4      T5 Nustep   Level 5    Minutes 15    METs 2.4      Oxygen   Maintain Oxygen Saturation 88% or higher             Nutrition:  Target Goals: Understanding of nutrition guidelines, daily intake of sodium 1500mg , cholesterol 200mg , calories 30% from fat and 7% or less from saturated fats, daily to have 5 or more servings of fruits and vegetables.  Education: All About Nutrition: -Group instruction provided by verbal, written material, interactive activities, discussions, models, and posters to present general guidelines for heart healthy nutrition including fat, fiber, MyPlate, the role of sodium in heart healthy nutrition, utilization of the nutrition label, and utilization of this knowledge for meal planning. Follow up email sent as well. Written material given at graduation. Flowsheet Row Pulmonary Rehab from 05/04/2023 in [facility] Cardiac and Pulmonary Rehab  Education need identified 03/28/23       Biometrics:    Nutrition Therapy Plan and Nutrition Goals:   Nutrition Assessments:  MEDIFICTS Score Key: ?70 Need to make dietary changes  40-70 Heart Healthy Diet ? 40 Therapeutic Level Cholesterol Diet  Flowsheet Row Pulmonary Rehab from 03/28/2023 in [facility] Cardiac and Pulmonary Rehab  Picture Your Plate Total Score on Admission 53      Picture Your Plate Scores: <16 Unhealthy dietary pattern with much room for improvement. 41-50 Dietary pattern unlikely to meet recommendations for good health and room for improvement. 51-60 More healthful dietary pattern, with some room for improvement.  >60 Healthy dietary pattern, although there may be  some specific behaviors that could be improved.   Nutrition Goals Re-Evaluation:   Nutrition Goals Discharge (Final Nutrition Goals Re-Evaluation):   Psychosocial: Target Goals: Acknowledge presence or absence of significant depression and/or stress, maximize coping skills, provide positive support system. Participant  is able to verbalize types and ability to use techniques and skills needed for reducing stress and depression.   Education: Stress, Anxiety, and Depression - Group verbal and visual presentation to define topics covered.  Reviews how body is impacted by stress, anxiety, and depression.  Also discusses healthy ways to reduce stress and to treat/manage anxiety and depression.  Written material given at graduation. Flowsheet Row Pulmonary Rehab from 03/31/2018 in [facility] Cardiac and Pulmonary Rehab  Date 02/08/18  Educator [facility]  Instruction Review Code 1- [employer] Understanding       Education: Sleep Hygiene -Provides group verbal and written instruction about how sleep can affect your health.  Define sleep hygiene, discuss sleep cycles and impact of sleep habits. Review good sleep hygiene tips.    Initial Review & Psychosocial Screening:   Quality of Life Scores:  Scores of 19 and below usually indicate a poorer quality of life in these areas.  A difference of  2-3 points is a clinically meaningful difference.  A difference of 2-3 points in the total score of the Quality of Life Index has been associated with significant improvement in overall quality of life, self-image, physical symptoms, and general health in studies assessing change in quality of life.  PHQ-9: Review Flowsheet  More data exists      05/23/2023 04/25/2023 03/28/2023 07/07/2018 05/22/2018  Depression screen PHQ 2/9  Decreased Interest 2 2 3 2 2   Down, Depressed, Hopeless 2 3 3 2 2   PHQ - 2 Score 4 5 6 4 4   Altered sleeping 2 2 2 2 1   Tired, decreased energy 2 2 2 2 1   Change in appetite 1 2 2 2 1   Feeling bad or failure about yourself  2 3 2 2 1   Trouble concentrating 2 2 2  0 0  Moving slowly or fidgety/restless 2 2 2  0 0  Suicidal thoughts 0 0 0 0 0  PHQ-9 Score 15 18 18 12 8   Difficult doing work/chores Somewhat difficult Somewhat difficult Somewhat difficult - -    Details           Interpretation of Total Score   Total Score Depression Severity:  1-4 = Minimal depression, 5-9 = Mild depression, 10-14 = Moderate depression, 15-19 = Moderately severe depression, 20-27 = Severe depression   Psychosocial Evaluation and Intervention:   Psychosocial Re-Evaluation:   Psychosocial Discharge (Final Psychosocial Re-Evaluation):   Education: Education Goals: Education classes will be provided on a weekly basis, covering required topics. Participant will state understanding/return demonstration of topics presented.  Learning Barriers/Preferences:   General Pulmonary Education Topics:  Infection Prevention: - Provides verbal and written material to individual with discussion of infection control including proper hand washing and proper equipment cleaning during exercise session. Flowsheet Row Pulmonary Rehab from 05/04/2023 in [facility] Cardiac and Pulmonary Rehab  Date 03/28/23  Educator [facility]  Instruction Review Code 1- Verbalizes Understanding       Falls Prevention: - Provides verbal and written material to individual with discussion of falls prevention and safety. Flowsheet Row Pulmonary Rehab from 05/04/2023 in [facility] Cardiac and Pulmonary Rehab  Date 03/28/23  Educator [facility]  Instruction Review Code 5- Refused Teaching       Chronic Lung Disease Review: -  Group verbal instruction with posters, models, PowerPoint presentations and videos,  to review new updates, new respiratory medications, new advancements in procedures and treatments. Providing information on websites and "800" numbers for continued self-education. Includes information about supplement oxygen, available portable oxygen systems, continuous and intermittent flow rates, oxygen safety, concentrators, and Medicare reimbursement for oxygen. Explanation of Pulmonary Drugs, including class, frequency, complications, importance of spacers, rinsing mouth after steroid MDI's, and proper cleaning methods for nebulizers. Review of basic lung  anatomy and physiology related to function, structure, and complications of lung disease. Review of risk factors. Discussion about methods for diagnosing sleep apnea and types of masks and machines for OSA. Includes a review of the use of types of environmental controls: home humidity, furnaces, filters, dust mite/pet prevention, HEPA vacuums. Discussion about weather changes, air quality and the benefits of nasal washing. Instruction on Warning signs, infection symptoms, calling MD promptly, preventive modes, and value of vaccinations. Review of effective airway clearance, coughing and/or vibration techniques. Emphasizing that all should Create an Action Plan. Written material given at graduation. Flowsheet Row Pulmonary Rehab from 05/04/2023 in [facility] Cardiac and Pulmonary Rehab  Education need identified 03/28/23  Date 05/04/23  Educator [facility]  Instruction Review Code 1- Verbalizes Understanding       AED/CPR: - Group verbal and written instruction with the use of models to demonstrate the basic use of the AED with the basic ABC's of resuscitation. Flowsheet Row Pulmonary Rehab from 03/31/2018 in [facility] Cardiac and Pulmonary Rehab  Date 02/17/18  Educator [facility]  Instruction Review Code 1- Verbalizes Understanding        Anatomy and Cardiac Procedures: - Group verbal and visual presentation and models provide information about basic cardiac anatomy and function. Reviews the testing methods done to diagnose heart disease and the outcomes of the test results. Describes the treatment choices: Medical Management, Angioplasty, or Coronary Bypass Surgery for treating various heart conditions including Myocardial Infarction, Angina, Valve Disease, and Cardiac Arrhythmias.  Written material given at graduation.   Medication Safety: - Group verbal and visual instruction to review commonly prescribed medications for heart and lung disease. Reviews the medication, class of the drug, and side effects. Includes  the steps to properly store meds and maintain the prescription regimen.  Written material given at graduation. Flowsheet Row Pulmonary Rehab from 03/31/2018 in [facility] Cardiac and Pulmonary Rehab  Date 02/03/18  Educator [facility]  Instruction Review Code 1- Verbalizes Understanding       Other: -Provides group and verbal instruction on various topics (see comments)   Knowledge Questionnaire Score:    Core Components/Risk Factors/Patient Goals at Admission:   Education:Diabetes - Individual verbal and written instruction to review signs/symptoms of diabetes, desired ranges of glucose level fasting, after meals and with exercise. Acknowledge that pre and post exercise glucose checks will be done for 3 sessions at entry of program.   Know Your Numbers and Heart Failure: - Group verbal and visual instruction to discuss disease risk factors for cardiac and pulmonary disease and treatment options.  Reviews associated critical values for Overweight/Obesity, Hypertension, Cholesterol, and Diabetes.  Discusses basics of heart failure: signs/symptoms and treatments.  Introduces Heart Failure Zone chart for action plan for heart failure.  Written material given at graduation.   Core Components/Risk Factors/Patient Goals Review:    Core Components/Risk Factors/Patient Goals at Discharge (Final Review):    ITP Comments:  ITP Comments     Row Name 05/31/23 1101 06/01/23 0920 06/28/23 1419 07/21/23 1611 07/27/23 1407  ITP Comments Tan saw his pulmonologist today.  They would like to pull him on medical hold for rehab at this time.  They feel that his reflux and cancer treatment responses are hindering his breathing on top of the exercise and not letting him make a recovery.  They would like him to hold off until he is feeling better.  Doctor said hold until follow up in August.  Kruze wants to call her back in about 4-6 weeks to see how he is doing.  He was told to keep up updated and to call us  to sign up for time slot prior to returning to rehab in August. 30 Day review completed. Medical Director ITP review done, changes made as directed, and signed approval by Medical Director.    on medical hold 30 Day review completed. Medical Director ITP review done, changes made as directed, and signed approval by Medical Director. Called to follow up with pt after being placed on medical hold for pulmonary rehab. His last rehab session was 05/30/23 and completed 14/36 sessions. He had discussed possible early discharge d/t being in the hospital and was also referred to the heart failure clinic. Pt will discharge at this time and states he will follow up with Dr. [name] this month. He is interested in returning when he is able to attend consistently with a new referral. DIscharged            Comments:

## 2023-07-29 ENCOUNTER — Encounter: Payer: Self-pay | Admitting: Pulmonary Disease

## 2023-08-02 ENCOUNTER — Encounter (HOSPITAL_COMMUNITY): Payer: Medicare Other | Admitting: Cardiology

## 2023-08-05 ENCOUNTER — Ambulatory Visit (INDEPENDENT_AMBULATORY_CARE_PROVIDER_SITE_OTHER): Payer: Medicare Other | Admitting: Nurse Practitioner

## 2023-08-05 ENCOUNTER — Encounter: Payer: Self-pay | Admitting: Nurse Practitioner

## 2023-08-05 ENCOUNTER — Telehealth: Payer: Self-pay

## 2023-08-05 VITALS — BP 116/68 | HR 107 | Temp 98.4°F | Ht 70.0 in | Wt 191.2 lb

## 2023-08-05 DIAGNOSIS — J9611 Chronic respiratory failure with hypoxia: Secondary | ICD-10-CM

## 2023-08-05 DIAGNOSIS — J449 Chronic obstructive pulmonary disease, unspecified: Secondary | ICD-10-CM | POA: Diagnosis not present

## 2023-08-05 DIAGNOSIS — C159 Malignant neoplasm of esophagus, unspecified: Secondary | ICD-10-CM

## 2023-08-05 DIAGNOSIS — K219 Gastro-esophageal reflux disease without esophagitis: Secondary | ICD-10-CM | POA: Diagnosis not present

## 2023-08-05 MED ORDER — SUCRALFATE 1 G PO TABS: 1.0000 g | ORAL_TABLET | Freq: Three times a day (TID) | ORAL | 2 refills | Status: DC

## 2023-08-05 MED ORDER — OHTUVAYRE 3 MG/2.5ML IN SUSP: 3.0000 mL | Freq: Two times a day (BID) | RESPIRATORY_TRACT | 0 refills | Status: DC

## 2023-08-05 NOTE — Progress Notes (Signed)
@Patient  ID: Leslie Sims, male    DOB: [birth year], 72 y.o.   MRN: [id number]  Chief Complaint  Patient presents with   Follow-up    SOB and mild dry cough    Referring provider: [name], MD  HPI: 72 year old male, former heavy smoker followed for very severe COPD and chronic respiratory failure on supplemental oxygen.  He is a patient Dr. [name] and last seen in office 05/31/2023.  Past medical history significant for hypertension, CAD, CHF, allergic rhinitis, GERD, hx of esophageal cancer, HLD, depression, anxiety, IDA.   TEST/EVENTS:  01/29/2021 PFT: FVC 42, FEV1 22, ratio 37, DLCO 21 corrects to 45% for alveolar volume.  No bronchodilator response.  Very severe obstructive disease with severe diffusing defect. 04/04/2023 RHC/LHC: Mild nonobstructive CAD.  EF 25 to 35%.  Normal RA pressure, mild pulmonary hypertension, mildly elevated wedge pressure and normal cardiac output. 05/02/2023 echocardiogram: EF 35 to 40%.  Mild LVH.  G1 DD.  Severe hypokinesis.  RV size and function is normal.  LA mildly dilated. 07/22/2023 CT chest: Atherosclerosis/CAD.  Mid esophagus is mildly dilated and fluid-filled.  Mild wall thickening involving the distal esophagus.  Moderate centrilobular emphysema.  Probable secretions in the right side of the trachea.  Right upper lobe scarring.  Mild focal thickening is unchanged.  Left upper lobe centrally calcified 10 mm nodule, unchanged and likely granuloma.  Probable aspirated contrast in the right lower lobe, similar.  Superior segment left lower lobe calcified granuloma.  No pulmonary metastases.  05/31/2023: OV with Dr. [name].  Last seen March 2024.  Since that time has had 7 visits to the ED due to burning chest pain and shortness of breath.  Last ED visit was 05/28/2023.  Gets no relief from steroids or antibiotics.  Only partial relief from nebulizers.  Since that time, has noted to have significant drop in heart function.  Has been referred to  the advanced heart failure clinic.  Currently appears compensated.  Advised to continue DuoNebs and budesonide.  Increase pulmonary hygiene and add Acapella device on.  Compliant with oxygen.  Suspect recent issues with dyspnea likely related to combined systolic and diastolic heart failure.  Esophageal dysmotility is likely adding to complexity of his management.  Discontinue lansoprazole.  Start pantoprazole twice daily.  Recommend follow-up with GI.  08/05/2023: Today-follow-up Patient presents today for follow-up.  Since he was here last, he was seen in the emergency department on 07/15/2023 at [facility] and provided azithromycin and prednisone for AECOPD/bronchitis.  He then went to urgent care on 07/31/2023 with complaints of burning sensation in center of his chest, increased shortness of breath and cough.  Felt as though this was consistent with his prior COPD exacerbations.  Chest x-ray did not show any acute process.  He was started on Augmentin and prednisone taper, which she is still on today.  He does tell me that he is feeling some better.  Still has trouble with his breathing during any sort of activity but feels like it is closer to his baseline.  Sputum production has improved.  He still having an occasional mild, dry cough.  Chest congestion also feels better.  Has not noticed as much wheezing.  Denies any fevers, chills, hemoptysis.  His weights have been stable.  Not noticing any leg swelling or orthopnea.  Has not had any increased oxygen requirements.  He is using DuoNebs 4 times a day and we does 9 twice a day.  He is taking the  Protonix for his reflux.  Felt like the lansoprazole was working better for him but it gave him stomach upset. Denies any difficulties swallowing, nausea, vomiting, hoarse voice quality.  He informed me today that he is actually taking azithromycin 3 times a week for the last couple of years.  He has not noticed a significant difference in his symptoms.  He has still had  recurrent exacerbations.  He wants to know if he needs to continue taking this.  Allergies  Allergen Reactions   Prozac [Fluoxetine Hcl] Shortness Of Breath   Hydroxyzine    Bupropion Other (See Comments)    "Makes me feel funny" Other reaction(s): Other (See Comments) Unknown/"made me feel real funny" "Makes me feel funny" "Makes me feel funny" Other reaction(s): Other (See Comments) Unknown/"made me feel real funny" "Makes me feel funny"   Effexor Xr [Venlafaxine Hcl Er] Other (See Comments)    "Makes me feel funny"    Immunization History  Administered Date(s) Administered   Covid-19, Mrna,Vaccine(Spikevax)73yrs and older 11/05/2022, 03/29/2023   Covid-19,MRNA Vaccine(Spikevax)74mos. thru 83yrs 11/05/2022   Fluad Quad(high Dose 65+) 11/16/2021, 11/05/2022   Influenza Whole 09/19/2012   Influenza, High Dose Seasonal PF 09/28/2019   Influenza,inj,Quad PF,6+ Mos 10/24/2013, 11/25/2014, 03/02/2016, 10/27/2016, 10/05/2017   Influenza-Unspecified 10/24/2013, 11/25/2014, 03/02/2016, 10/27/2016, 10/05/2017, 09/18/2018, 09/28/2019, 09/30/2020   Moderna Sars-Covid-2 Vaccination 01/26/2020, 02/23/2020   PFIZER Comirnaty(Gray Top)Covid-19 Tri-Sucrose Vaccine 12/15/2020, 07/15/2021   PFIZER(Purple Top)SARS-COV-2 Vaccination 01/26/2020, 02/23/2020   PNEUMOCOCCAL CONJUGATE-20 03/22/2022   Pfizer Covid-19 Vaccine Bivalent Booster 17yrs & up 01/14/2022, 05/25/2022   Pneumococcal Conjugate-13 11/25/2014   Pneumococcal Polysaccharide-23 08/24/2016, 10/27/2016   Pneumococcal-Unspecified 12/20/2008   Tdap 12/20/2008    Past Medical History:  Diagnosis Date   Allergic rhinitis    Anxiety    Back pain    Cancer (HCC)    prostate   CHF (congestive heart failure) (HCC)    Colon polyps 08/2012   colonoscopy; multiple colon polyps; repeat colonoscopy in one year.   Complication of anesthesia    COPD (chronic obstructive pulmonary disease) (HCC)    a. on home O2.   Coronary artery disease  11/2009   a. late presenting ant MI; b. LHC 100% pLAD s/p PCI/DES, 99% mRCA s/p PCI/DES, EF 35%; c. nuclear stress test 05/13: prior ant/inf infarcts w/o ischemia, EF 43%; d. LHC 02/14: widely patent stents with no other obs dz, EF 40%; e. LHC 11/17: LM nl, mLAD 10%, patent LAD stent, dLAD 20%, p-mRCA 10%, mRCA 40%, patent RCA stent, EF 35-45%    Depression    Diabetes (HCC)    Dysphagia    Family history of adverse reaction to anesthesia    GERD (gastroesophageal reflux disease)    Headache(784.0)    Helicobacter pylori (H. pylori)    Hemorrhoids    Hernia    inguinal   HFrEF (heart failure with reduced ejection fraction) (HCC)    a. 10/2016 LV gram: EF 35-45%; b. 06/2018 Echo: EF 40-45%. c. 01/2021 Echo: EF 40-45%   Hyperlipidemia    Hypertension    Insomnia    Ischemic cardiomyopathy    a. 10/2016 LV gram: EF 35-45%; b. 06/2018 Echo: EF 40-45%.   Myalgia    Oxygen dependent    Peptic ulcer    Pulmonary nodules    ST elevation (STEMI) myocardial infarction involving left anterior descending coronary artery (HCC) 11/2009   a. s/p PCI to the LAD   Status post dilation of esophageal narrowing 2000   Thickening of  esophagus    Tinea pedis    Wears dentures    partial upper    Tobacco History: Social History   Tobacco Use  Smoking Status Former   Current packs/day: 0.00   Average packs/day: 3.0 packs/day for 42.0 years (126.0 ttl pk-yrs)   Types: Cigarettes   Start date: 02/10/1977   Quit date: 02/10/2019   Years since quitting: 4.4  Smokeless Tobacco Never  Tobacco Comments   25 months ago most smoked 4 packs a day .   Counseling given: Not Answered Tobacco comments: 25 months ago most smoked 4 packs a day .   Outpatient Medications Prior to Visit  Medication Sig Dispense Refill   albuterol (VENTOLIN HFA) 108 (90 Base) MCG/ACT inhaler TAKE 2 PUFFS BY MOUTH EVERY 6 HOURS AS NEEDED FOR WHEEZE OR SHORTNESS OF BREATH 18 each 2   ALPRAZolam (XANAX) 0.5 MG tablet Take 0.5 mg  by mouth 3 (three) times daily.     amoxicillin-clavulanate (AUGMENTIN) 875-125 MG tablet Take by mouth.     aspirin 81 MG tablet Take 81 mg by mouth daily.     atorvastatin (LIPITOR) 80 MG tablet Take 1 tablet (80 mg total) by mouth daily. 90 tablet 3   budesonide (PULMICORT) 0.5 MG/2ML nebulizer solution Take 2 mLs (0.5 mg total) by nebulization in the morning and at bedtime. 360 mL 10   dapagliflozin propanediol (FARXIGA) 10 MG TABS tablet Take 1 tablet (10 mg total) by mouth daily before breakfast. 30 tablet 6   fluticasone (FLONASE) 50 MCG/ACT nasal spray Place 2 sprays into both nostrils daily.     furosemide (LASIX) 20 MG tablet Take 1 tablet (20 mg total) by mouth as needed. 30 tablet 1   ipratropium-albuterol (DUONEB) 0.5-2.5 (3) MG/3ML SOLN Take 3 mLs by nebulization every 6 (six) hours as needed. 360 mL 11   isosorbide mononitrate (IMDUR) 30 MG 24 hr tablet Take 1 tablet (30 mg total) by mouth daily. 90 tablet 3   ketoconazole (NIZORAL) 2 % cream Apply topically 2 (two) times daily.     loratadine (CLARITIN) 10 MG tablet Take 10 mg by mouth daily.     mirtazapine (REMERON) 45 MG tablet Take 45 mg by mouth at bedtime.     Multiple Vitamin (MULTIVITAMIN) capsule Take 1 capsule by mouth daily.     nitroGLYCERIN (NITROSTAT) 0.4 MG SL tablet Place 1 tablet (0.4 mg total) under the tongue every 5 (five) minutes as needed. 25 tablet 3   OXYGEN Inhale 3 L into the lungs daily.     pantoprazole (PROTONIX) 40 MG tablet Take 1 tablet (40 mg total) by mouth 2 (two) times daily before a meal. 60 tablet 3   predniSONE (DELTASONE) 10 MG tablet Take 6 tabs today, then decrease by one tab daily - 5 tabs on day 2, 4 tabs on day 3, 3 tabs on day 4, 2 tabs on day 5, 1 tab on day 6.     sacubitril-valsartan (ENTRESTO) 24-26 MG Take 1 tablet by mouth 2 (two) times daily. 60 tablet 6   Spacer/Aero-Holding Chambers (AEROCHAMBER MV) inhaler Use as instructed 1 each 2   spironolactone (ALDACTONE) 25 MG tablet  Take 0.5 tablets (12.5 mg total) by mouth every evening. 30 tablet 6   azithromycin (ZITHROMAX) 250 MG tablet Take 250 mg by mouth 3 (three) times a week.     ipratropium-albuterol (DUONEB) 0.5-2.5 (3) MG/3ML SOLN Take 3 mLs by nebulization 2 (two) times daily.     No  facility-administered medications prior to visit.     Review of Systems:   Constitutional: No weight loss or gain, night sweats, fevers, chills, or lassitude. +fatigue (baseline) HEENT: No headaches, difficulty swallowing, tooth/dental problems, or sore throat. No sneezing, itching, ear ache, nasal congestion, or post nasal drip CV:  No chest pain, orthopnea, PND, swelling in lower extremities, anasarca, dizziness, palpitations, syncope Resp: +shortness of breath with exertion; cough; resolved wheeze; improving chest congestion. No excess mucus or change in color of mucus. No hemoptysis. No chest wall deformity GI:  +heartburn, indigestion. No abdominal pain, nausea, vomiting, diarrhea, change in bowel habits, loss of appetite, bloody stools.  GU: No dysuria, change in color of urine, urgency or frequency.   Skin: No rash, lesions, ulcerations MSK:  No joint pain or swelling.  Neuro: No dizziness or lightheadedness.  Psych: No depression or anxiety. Mood stable.     Physical Exam:  BP 116/68 (BP Location: Left Arm, Cuff Size: Normal)   Pulse (!) 107   Temp 98.4 F (36.9 C) (Temporal)   Ht 5\' 10"  (1.778 m)   Wt 191 lb 3.2 oz (86.7 kg)   SpO2 94%   BMI 27.43 kg/m   GEN: Pleasant, interactive, chronically-ill appearing; in no acute distress. HEENT:  Normocephalic and atraumatic. PERRLA. Sclera white. Nasal turbinates pink, moist and patent bilaterally. No rhinorrhea present. Oropharynx pink and moist, without exudate or edema. No lesions, ulcerations, or postnasal drip.  NECK:  Supple w/ fair ROM. No JVD present. Normal carotid impulses w/o bruits. Thyroid symmetrical with no goiter or nodules palpated. No  lymphadenopathy.   CV: RRR, no m/r/g, no peripheral edema. Pulses intact, +2 bilaterally. No cyanosis, pallor or clubbing. PULMONARY:  Unlabored, regular breathing. Diminished bilaterally A&P w/o wheezes/rales/rhonchi. No accessory muscle use.  GI: BS present and normoactive. Soft, non-tender to palpation. No organomegaly or masses detected. MSK: No erythema, warmth or tenderness. Cap refil <2 sec all extrem. No deformities or joint swelling noted.  Neuro: A/Ox3. No focal deficits noted.   Skin: Warm, no lesions or rashe Psych: Normal affect and behavior. Judgement and thought content appropriate.     Lab Results:  CBC    Component Value Date/Time   WBC 6.1 07/22/2023 0959   WBC 5.8 07/15/2023 1340   RBC 4.63 07/22/2023 0959   HGB 13.1 07/22/2023 0959   HGB 15.1 05/22/2018 1601   HCT 41.6 07/22/2023 0959   HCT 44.3 05/22/2018 1601   PLT 263 07/22/2023 0959   PLT 287 05/22/2018 1601   MCV 89.8 07/22/2023 0959   MCV 88 05/22/2018 1601   MCV 86 01/11/2013 0546   MCH 28.3 07/22/2023 0959   MCHC 31.5 07/22/2023 0959   RDW 13.1 07/22/2023 0959   RDW 13.5 05/22/2018 1601   RDW 14.7 (H) 01/11/2013 0546   LYMPHSABS 0.9 07/22/2023 0959   LYMPHSABS 1.8 05/22/2018 1601   LYMPHSABS 0.5 (L) 01/11/2013 0546   MONOABS 0.9 07/22/2023 0959   MONOABS 0.7 01/11/2013 0546   EOSABS 0.3 07/22/2023 0959   EOSABS 0.1 05/22/2018 1601   EOSABS 0.0 01/11/2013 0546   BASOSABS 0.0 07/22/2023 0959   BASOSABS 0.0 05/22/2018 1601   BASOSABS 0.0 01/11/2013 0546    BMET    Component Value Date/Time   NA 141 07/22/2023 0959   NA 141 05/22/2018 1601   NA 140 01/11/2013 0546   K 3.5 07/22/2023 0959   K 4.4 01/11/2013 0546   CL 103 07/22/2023 0959   CL 105 01/11/2013 0546  CO2 30 07/22/2023 0959   CO2 31 01/11/2013 0546   GLUCOSE 116 (H) 07/22/2023 0959   GLUCOSE 145 (H) 01/11/2013 0546   BUN 15 07/22/2023 0959   BUN 20 05/22/2018 1601   BUN 10 01/11/2013 0546   CREATININE 0.92 07/22/2023  0959   CREATININE 0.92 10/13/2016 1241   CALCIUM 8.5 (L) 07/22/2023 0959   CALCIUM 9.2 01/11/2013 0546   GFRNONAA >60 07/22/2023 0959   GFRNONAA >89 01/08/2014 1100   GFRAA >60 07/28/2020 1250   GFRAA >89 01/08/2014 1100    BNP    Component Value Date/Time   BNP 25.0 06/27/2023 1649     Imaging:  CT CHEST ABDOMEN PELVIS W CONTRAST  Result Date: 07/25/2023 CLINICAL DATA:  Follow-up of esophageal cancer. No recent therapy or surgery. Prior prostate carcinoma. * Tracking Code: BO * EXAM: CT CHEST, ABDOMEN, AND PELVIS WITH CONTRAST TECHNIQUE: Multidetector CT imaging of the chest, abdomen and pelvis was performed following the standard protocol during bolus administration of intravenous contrast. RADIATION DOSE REDUCTION: This exam was performed according to the departmental dose-optimization program which includes automated exposure control, adjustment of the mA and/or kV according to patient size and/or use of iterative reconstruction technique. CONTRAST:  OMNIPAQUE IOHEXOL 300 MG/ML  SOLN COMPARISON:  03/11/2023 FINDINGS: CT CHEST FINDINGS Cardiovascular: Aortic atherosclerosis. Normal heart size, without pericardial effusion. Left apical ventricular hypoattenuation likely represents remote infarct. Lad coronary artery calcification. No central pulmonary embolism, on this non-dedicated study. Mediastinum/Nodes: No supraclavicular adenopathy. No mediastinal or hilar adenopathy. The mid esophagus is mildly dilated and fluid-filled. Again identified is mild wall thickening involving the distal esophagus including on 54/2. No well-defined mass. Lungs/Pleura: No pleural fluid.  Moderate centrilobular emphysema. Probable secretions along the anterior right side of the trachea. Right upper lobe scarring is similar. There is a mild focal thickening posteriorly on 41/4 and more anteriorly on 47/4 unchanged. Left upper lobe centrally calcified 10 mm nodule on 50/4 is unchanged, likely a granuloma.  Probable aspirated contrast in the lateral right lower lobe, similar. Superior segment left lower lobe calcified granuloma. No pulmonary metastasis. Musculoskeletal: No acute osseous abnormality. CT ABDOMEN PELVIS FINDINGS Hepatobiliary: Normal liver. Normal gallbladder. Common duct measures up to 12 mm in the porta hepatis on 67/2, similar. Tapers in the region of the pancreatic head without obstructive stone or mass. Similar back to 10/19/2018. Pancreas: Normal, without mass or ductal dilatation. Spleen: Old granulomatous disease within. Adrenals/Urinary Tract: Left larger than right adrenal nodules are unchanged including at up to 2.4 cm. Present and similar back to 10/19/2018. Normal kidneys, without hydronephrosis.  Normal urinary bladder. Stomach/Bowel: Normal stomach, without wall thickening. Normal colon and terminal ileum. Normal appendix. Vascular/Lymphatic: Aortic atherosclerosis. No abdominopelvic adenopathy. Reproductive: Radiation seeds in the prostate. Penile prosthesis with deflated pump. Other: No significant free fluid. Small fat containing right inguinal hernia. No evidence of omental or peritoneal disease. Musculoskeletal: No acute osseous abnormality. IMPRESSION: 1. Similar mild distal esophageal wall thickening, without well-defined mass. No evidence of metastatic disease. 2. Esophageal air fluid level suggests dysmotility or gastroesophageal reflux. 3. Bilateral adrenal nodules, similar back to 2019 and presumably adenomas. No specific follow-up indicated. 4. Mild common duct dilatation which is unchanged back to at least 2019 and most likely within normal variation. This could be correlated with bilirubin levels. 5. Incidental findings, including: Aortic atherosclerosis (ICD10-I70.0), coronary artery atherosclerosis and emphysema (ICD10-J43.9). Electronically Signed   By: Jeronimo Greaves M.D.   On: 07/25/2023 11:43   DG Chest Portable  1 View  Result Date: 07/15/2023 CLINICAL DATA:  sob EXAM:  PORTABLE CHEST 1 VIEW COMPARISON:  05/28/2023. FINDINGS: Redemonstration of mild central and basilar pulmonary vascular congestion/edema. Bilateral lung fields are otherwise clear. Bilateral costophrenic angles are clear. Stable cardio-mediastinal silhouette. No acute osseous abnormalities. Old/healed fracture of posterior left seventh rib again seen. The soft tissues are within normal limits. IMPRESSION: 1. Mild central and basilar pulmonary vascular congestion/edema. Electronically Signed   By: Jules Schick M.D.   On: 07/15/2023 14:38    Administration History     None          Latest Ref Rng & Units 01/29/2021    3:57 PM  PFT Results  FVC-Predicted Pre % 42  P  FVC-Post L 1.83  P  FVC-Predicted Post % 40  P  Pre FEV1/FVC % % 39  P  Post FEV1/FCV % % 37  P  FEV1-Pre L 0.74  P  FEV1-Predicted Pre % 22  P  FEV1-Post L 0.68  P  DLCO uncorrected ml/min/mmHg 5.57  P  DLCO [facility]% % 21  P  DLVA Predicted % 45  P    P Preliminary result    No results found for: "NITRICOXIDE"      Assessment & Plan:   COPD, very severe (HCC) Resolving AECOPD.  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  He also has a component of GERD that exacerbates his dyspnea/cough.  He is already maintained on triple therapy regimen.  Will add on phosphodiesterase inhibitor with Ohtuvayre twice daily.  Unbeknownst to [place], he has been on chronic macrolide therapy for the last few years.  Does not seem to be effective.  Will discontinue.  Action plan in place.  Encouraged him to attend cardiopulmonary rehab.  Patient Instructions  Continue Albuterol inhaler 2 puffs every 6 hours as needed for shortness of breath or wheezing. Notify if symptoms persist despite rescue inhaler/neb use.  Continue duoneb 3 mL every 6 hours  Continue budesonide 2 mL Twice daily. Brush tongue and rinse mouth afterwards Continue supplemental oxygen  4 lpm for goal >88-90% Continue pantoprazole 1 tab Twice daily   Stop azithromycin   Complete augmentin and prednisone  Carafate 1 tab Three times a day 1 hour before meals for reflux Start Ohtuvayre 2.5 mL nebulizer treatment Twice daily   Follow up with your heart doctor as scheduled   Follow up in 4-6 weeks with Dr. [name] or Katie Neve Branscomb,NP. If symptoms do not improve or worsen, please contact office for sooner follow up or seek emergency care.    Chronic respiratory failure with hypoxia (HCC) Stable on 4 L/min supplemental O2.  No increased O2 requirements.  Goal greater than 88 to 90%.  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  He is on PPI twice daily with pantoprazole.  Had better response to lansoprazole but could not tolerate this due to GI side effects.  Trial addition of Carafate 1 hour prior to meals.  Educated on side effect profile and proper administration.  Needs to follow-up with GI.  Advised him of this today.  Primary esophageal squamous cell carcinoma (HCC) Completed treatment of chemotherapy and radiation August 2023.  No evidence of recurrence.  Under surveillance every 6 months with CT imaging.  Next scan due February 2025.  Follow-up with oncology as scheduled.   I spent 41 minutes of dedicated to the care of this patient on the  date of this encounter to include pre-visit review of records, face-to-face time with the patient discussing conditions above, post visit ordering of testing, clinical documentation with the electronic health record, making appropriate referrals as documented, and communicating necessary findings to members of the patients care team.  Noemi Chapel, NP 08/05/2023  Pt aware and understands NP's role.

## 2023-08-05 NOTE — Assessment & Plan Note (Signed)
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  He is on PPI twice daily with pantoprazole.  Had better response to lansoprazole but could not tolerate this due to GI side effects.  Trial addition of Carafate 1 hour prior to meals.  Educated on side effect profile and proper administration.  Needs to follow-up with GI.  Advised him of this today.

## 2023-08-05 NOTE — Patient Instructions (Addendum)
Continue Albuterol inhaler 2 puffs every 6 hours as needed for shortness of breath or wheezing. Notify if symptoms persist despite rescue inhaler/neb use.  Continue duoneb 3 mL every 6 hours  Continue budesonide 2 mL Twice daily. Brush tongue and rinse mouth afterwards Continue supplemental oxygen 4 lpm for goal >88-90% Continue pantoprazole 1 tab Twice daily   Stop azithromycin   Complete augmentin and prednisone  Carafate 1 tab Three times a day 1 hour before meals for reflux Start Ohtuvayre 2.5 mL nebulizer treatment Twice daily   Follow up with your heart doctor as scheduled   Follow up in 4-6 weeks with Dr. Jayme Sims or Leslie Greycen Felter,NP. If symptoms do not improve or worsen, please contact office for sooner follow up or seek emergency care.

## 2023-08-05 NOTE — Telephone Encounter (Signed)
Ohtuvayre rx form completed and faxed to [facility] at [phone]

## 2023-08-05 NOTE — Progress Notes (Signed)
Agree with the details of the visit as noted by Katherine Cobb, NP. ° °C. Laura Gonzalez, MD °Advanced Bronchoscopy °[facility] ° °

## 2023-08-05 NOTE — Assessment & Plan Note (Signed)
Completed treatment of chemotherapy and radiation August 2023.  No evidence of recurrence.  Under surveillance every 6 months with CT imaging.  Next scan due February 2025.  Follow-up with oncology as scheduled.

## 2023-08-05 NOTE — Assessment & Plan Note (Signed)
Resolving AECOPD.  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  He also has a component of GERD that exacerbates his dyspnea/cough.  He is already maintained on triple therapy regimen.  Will add on phosphodiesterase inhibitor with Ohtuvayre twice daily.  Unbeknownst to [place], he has been on chronic macrolide therapy for the last few years.  Does not seem to be effective.  Will discontinue.  Action plan in place.  Encouraged him to attend cardiopulmonary rehab.  Patient Instructions  Continue Albuterol inhaler 2 puffs every 6 hours as needed for shortness of breath or wheezing. Notify if symptoms persist despite rescue inhaler/neb use.  Continue duoneb 3 mL every 6 hours  Continue budesonide 2 mL Twice daily. Brush tongue and rinse mouth afterwards Continue supplemental oxygen 4 lpm for goal >88-90% Continue pantoprazole 1 tab Twice daily   Stop azithromycin   Complete augmentin and prednisone  Carafate 1 tab Three times a day 1 hour before meals for reflux Start Ohtuvayre 2.5 mL nebulizer treatment Twice daily   Follow up with your heart doctor as scheduled   Follow up in 4-6 weeks with Dr. [name] or Katie Charitie Hinote,NP. If symptoms do not improve or worsen, please contact office for sooner follow up or seek emergency care.

## 2023-08-05 NOTE — Assessment & Plan Note (Signed)
Stable on 4 L/min supplemental O2.  No increased O2 requirements.  Goal greater than 88 to 90%.

## 2023-08-10 ENCOUNTER — Ambulatory Visit: Payer: Medicare Other | Attending: Cardiology | Admitting: Cardiology

## 2023-08-10 VITALS — BP 113/67 | HR 88 | Wt 192.5 lb

## 2023-08-10 DIAGNOSIS — I5032 Chronic diastolic (congestive) heart failure: Secondary | ICD-10-CM

## 2023-08-10 MED ORDER — BISOPROLOL FUMARATE 5 MG PO TABS: 2.5000 mg | ORAL_TABLET | Freq: Every day | ORAL | 6 refills | Status: DC

## 2023-08-10 NOTE — Patient Instructions (Signed)
Medication Changes:  STOP Imdur (Isosorbide)  START Bisoprolol 2.5 mg (1/2 tab) Daily  Lab Work:  Labs done today, your results will be available in MyChart, we will contact you for abnormal readings.  Special Instructions // Education:  Do the following things EVERYDAY: Weigh yourself in the morning before breakfast. Write it down and keep it in a log. Take your medicines as prescribed Eat low salt foods--Limit salt (sodium) to 2000 mg per day.  Stay as active as you can everyday Limit all fluids for the day to less than 2 liters   Follow-Up in: 3 months, **PLEASE CALL OUR OFFICE IN OCTOBER TO SCHEDULE THIS APPOINTMENT    If you have any questions or concerns before your next appointment please send [place] a message through mychart or call our office at [phone] Monday-Friday 8 am-5 pm.   If you have an urgent need after hours on the weekend please call your Primary Cardiologist or the [facility] in [place] at [phone].

## 2023-08-10 NOTE — Progress Notes (Signed)
PCP: [name], MD Cardiology: Dr. Kirke Corin HF Cardiology: Dr. Shirlee Latch  72 y.o. with history of CAD, ischemic cardiomyopathy, and severe COPD on home oxygen was referred by Dr. Kirke Corin for evaluation of heart failure. Patient had an anterior MI in 2010.  At that time, he had DES to LAD and to RCA.  Repeat caths in 2017 and in 4/24 have shown patent stents and minimal progression of CAD.  Echoes showed EF 40-45% generally, then echo in 9/23 showed EF up to 50-55% though study was technically difficult.  LV-gram with cath in 4/24 suggested lower EF at 25-35%. Patient was a heavy smoker in the past, quit in 2020.  He has severe COPD and wears 3L home oxygen. 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    Due to progressive dyspnea and atypical chest pain, he had RHC/LHC done in 4/24.  Results of angiography and LV-gram as above, RHC showed normal filling pressures and preserved cardiac output.  He went to the ER yesterday with cough and worsening dyspnea, he was thought to have a COPD exacerbation and was given prednisone and nebs.    Echo in 5/24 showed EF 35-40%, mild LV dilation, normal RV.    Patient continues to wear 3L home oxygen.  He has had GERD symptoms (has esophageal dysmotility post-esophageal cancer therapy) and has recently started on Carafate.  Still has occasional episodes of "heart burn."  No exertional chest pain.  Fatigues easily, not very active.  Treated recently with prednisone and Augmentin for COPD exacerbation.  Took 1 Lasix on Monday due to ankle edema.  Mild dyspnea walking into the office.  Has slept in recliner chronically x 6 years.  No PND.                                                                                                       Labs (2/24): LDL 47 Labs (4/24): hgb 13.3, K 4.1, creatinine 0.95 Labs (6/24): BNP 42, K 3.9, creatinine 0.93 Labs (7/24): LDL 58 Labs (*/24):  K 3.5, creatinine 0.92  PMH: 1. CAD: Anterior MI in 2010 with DES to LAD and RCA.  - LHC (2017): Patent stents.  - LHC (4/24): Mild nonobstructive CAD with patent stents.  2. Chronic systolic CHF: Ischemic cardiomyopathy.   - Echo (2019): EF 40-45% - Echo (2/22): EF 40-45% - Echo (9/23): Technically difficult, EF 50-55%.  - LV-gram (4/24): EF 25-35% - RHC (4/24): mean RA 6, PA 38/15, mean PCWP 15, CI 3.16 - Echo (5/24): EF 35-40%, mild LV dilation, normal RV. 3. COPD: On 3.5 L home oxygen, severe. Quit smoking 2020.  4. H/o PVCs 5. Esophageal squamous cell carcinoma: S/p chemo-radiation.  6. Prostate cancer: Radiation.   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  Vaping Use   Vaping status: Never Used  Substance and Sexual Activity   Alcohol use: No    Alcohol/week: 0.0 standard drinks of alcohol   Drug use: No   Sexual activity: Yes  Other Topics Concern   Not on file  Social History Narrative   Marital status: Widowed. Lives alone.    Social Determinants of Health   Financial Resource Strain: Low Risk  (03/29/2023)   Received from [facility], [facility]   Overall Financial Resource Strain (CARDIA)    Difficulty of Paying Living Expenses: Not hard at all  Food Insecurity: No Food Insecurity (03/29/2023)   Received from [facility], [facility]   Hunger Vital Sign    Worried About Running Out of Food in the Last Year: Never true    Ran Out of Food in the Last Year: Never true  Transportation Needs: No Transportation Needs (03/29/2023)   Received from [facility], [facility]   [facility] - Transportation    In the past 12 months, has lack of transportation kept you from medical appointments or from getting medications?: No    Lack of Transportation (Non-Medical): No  Physical Activity: Inactive (01/06/2022)   Received from [facility], [facility]   Exercise Vital Sign    Days of Exercise per Week: 0 days    Minutes of Exercise per Session: 0 min  Stress: Stress Concern Present (01/06/2022)   Received from [facility], [facility]   [employer] [facility] - Occupational Stress Questionnaire    Feeling of Stress : Rather much  Social Connections: Unknown (01/06/2022)   Received from [facility], [facility]   Social Connection and Isolation Panel [NHANES]    Frequency of Communication with Friends and Family: Once a week    Frequency of Social Gatherings with Friends and Family: Once a week    Attends Religious Services: Not on file    Active Member of Clubs or Organizations: No    Attends Banker Meetings: Never    Marital Status: Widowed  Intimate Partner Violence: Not At Risk (11/14/2022)   Humiliation, Afraid, Rape, and Kick questionnaire    Fear of Current or Ex-Partner: No    Emotionally Abused: No    Physically Abused: No    Sexually Abused: No   Family History  Problem Relation Age of Onset   Heart attack Brother        Brother #1   Diabetes Brother    Hypertension Brother        #3   Coronary artery disease Father 4       deceased   Heart attack Father    Diabetes Father    Heart disease Father    COPD Mother 36        deceased   Alcohol abuse Sister        polysubstance abuse   COPD Sister  Lung cancer Sister    Alcohol abuse Sister        polysubstance abuse   Penile cancer Brother    Diabetes Brother    Prostate cancer Neg Hx    Bladder Cancer Neg Hx    Kidney cancer Neg Hx    ROS: All systems reviewed and negative except as per HPI.   Current Outpatient Medications  Medication Sig Dispense Refill   albuterol (VENTOLIN HFA) 108 (90 Base) MCG/ACT inhaler TAKE 2 PUFFS BY MOUTH EVERY 6 HOURS AS NEEDED FOR WHEEZE OR SHORTNESS OF BREATH 18 each 2   ALPRAZolam (XANAX) 0.5 MG tablet Take 0.5 mg by mouth 3 (three) times daily.     amoxicillin-clavulanate (AUGMENTIN) 875-125 MG tablet Take by mouth.     aspirin 81 MG tablet Take 81 mg by mouth daily.     atorvastatin (LIPITOR) 80 MG tablet Take 1 tablet (80 mg total) by mouth daily. 90 tablet 3   bisoprolol (ZEBETA) 5 MG tablet Take 0.5 tablets (2.5 mg total) by mouth daily. 15 tablet 6   budesonide (PULMICORT) 0.5 MG/2ML nebulizer solution Take 2 mLs (0.5 mg total) by nebulization in the morning and at bedtime. 360 mL 10   dapagliflozin propanediol (FARXIGA) 10 MG TABS tablet Take 1 tablet (10 mg total) by mouth daily before breakfast. 30 tablet 6   Ensifentrine (OHTUVAYRE) 3 MG/2.5ML SUSP Inhale 3 mLs into the lungs 2 (two) times daily. 360 mL 0   fluticasone (FLONASE) 50 MCG/ACT nasal spray Place 2 sprays into both nostrils daily.     furosemide (LASIX) 20 MG tablet Take 1 tablet (20 mg total) by mouth as needed. 30 tablet 1   ipratropium-albuterol (DUONEB) 0.5-2.5 (3) MG/3ML SOLN Take 3 mLs by nebulization every 6 (six) hours as needed. 360 mL 11   ketoconazole (NIZORAL) 2 % cream Apply topically 2 (two) times daily.     loratadine (CLARITIN) 10 MG tablet Take 10 mg by mouth daily.     mirtazapine (REMERON) 45 MG tablet Take 45 mg by mouth at bedtime.     Multiple Vitamin (MULTIVITAMIN) capsule Take 1 capsule by mouth daily.     nitroGLYCERIN  (NITROSTAT) 0.4 MG SL tablet Place 1 tablet (0.4 mg total) under the tongue every 5 (five) minutes as needed. 25 tablet 3   OXYGEN Inhale 3 L into the lungs daily.     pantoprazole (PROTONIX) 40 MG tablet Take 1 tablet (40 mg total) by mouth 2 (two) times daily before a meal. 60 tablet 3   sacubitril-valsartan (ENTRESTO) 24-26 MG Take 1 tablet by mouth 2 (two) times daily. 60 tablet 6   Spacer/Aero-Holding Chambers (AEROCHAMBER MV) inhaler Use as instructed 1 each 2   spironolactone (ALDACTONE) 25 MG tablet Take 0.5 tablets (12.5 mg total) by mouth every evening. 30 tablet 6   sucralfate (CARAFATE) 1 g tablet Take 1 tablet (1 g total) by mouth 3 (three) times daily with meals. 1 hour before meals and medications 90 tablet 2   No current facility-administered medications for this visit.   BP 113/67   Pulse 88   Wt 192 lb 8 oz (87.3 kg)   SpO2 99% Comment: 3L  BMI 27.62 kg/m  General: NAD Neck: No JVD, no thyromegaly or thyroid nodule.  Lungs: Distant BS CV: Nondisplaced PMI.  Heart regular S1/S2, no S3/S4, no murmur.  No peripheral edema.  No carotid bruit.  Normal pedal pulses.  Abdomen: Soft, nontender, no hepatosplenomegaly, no distention.  Skin: Intact  without lesions or rashes.  Neurologic: Alert and oriented x 3.  Psych: Normal affect. Extremities: No clubbing or cyanosis.  HEENT: Normal.   Assessment/Plan: 1. Chronic systolic CHF: Ischemic cardiomyopathy.  Historically, EF has been in the 40-45% range, but echo in 9/23 showed EF 50-55% (technically difficult study).  LV-gram with cath in 4/24 suggested that EF may be down to the 25-35% range.  He had no new coronary disease on cath in 4/24 to explain fall in EF.  Echo in 5/24 showed EF 35-40%, mild LV dilation, normal RV.  NYHA class III symptoms, but this is confounded by severe COPD.  Not volume overloaded on exam.   - Continue spironolactone 12.5 daily.  - Continue Entresto 24/26 bid. BMET/BNP today.   - Continue Farxiga 10 mg  daily.  - With severe COPD/exacerbations, would not use Coreg.  I will start the more beta-1 selective bisoprolol 2.5 mg daily today.   - EF appears to be just out of range of ICD and narrow QRS precludes CRT use.  2. COPD: Severe, on 3 L home oxygen. Prior smoker.  As above, I suspect COPD is a major contributor to his dyspnea.  He does not appear volume overloaded on exam.  - Continue followup with pulmonary.  3.  CAD: Anterior MI in 2010 with DES to LAD and RCA.  Cath in 4/24 showed patent stents and mild nonobstructive CAD.  He has episodes of atypical chest pain likely related to COPD/wheezing as well as GERD.  - Continue ASA 81 daily.  - Continue atorvastatin 80 mg daily, good lipids in 7/24.  - I think he can stop Imdur.   Followup in 3 months.   Marca Ancona 08/10/2023

## 2023-08-11 LAB — BASIC METABOLIC PANEL
BUN/Creatinine Ratio: 16 (ref 10–24)
BUN: 15 mg/dL (ref 8–27)
CO2: 25 mmol/L (ref 20–29)
Calcium: 8.9 mg/dL (ref 8.6–10.2)
Chloride: 101 mmol/L (ref 96–106)
Creatinine, Ser: 0.95 mg/dL (ref 0.76–1.27)
Glucose: 105 mg/dL — ABNORMAL HIGH (ref 70–99)
Potassium: 4.4 mmol/L (ref 3.5–5.2)
Sodium: 141 mmol/L (ref 134–144)
eGFR: 87 mL/min/{1.73_m2} (ref >59)

## 2023-08-11 LAB — BRAIN NATRIURETIC PEPTIDE: BNP: 25 pg/mL (ref 0.0–100.0)

## 2023-08-23 ENCOUNTER — Telehealth: Payer: Self-pay | Admitting: Nurse Practitioner

## 2023-08-23 DIAGNOSIS — J449 Chronic obstructive pulmonary disease, unspecified: Secondary | ICD-10-CM

## 2023-08-23 DIAGNOSIS — J9611 Chronic respiratory failure with hypoxia: Secondary | ICD-10-CM

## 2023-08-23 NOTE — Telephone Encounter (Signed)
Needs a refill sent in for Ensifentrine [facility]) 3 MG/2.5ML SUSP [[id number]]

## 2023-08-25 MED ORDER — OHTUVAYRE 3 MG/2.5ML IN SUSP
3.0000 mL | Freq: Two times a day (BID) | RESPIRATORY_TRACT | 0 refills | Status: DC
Start: 2023-08-25 — End: 2024-08-06

## 2023-08-25 NOTE — Telephone Encounter (Signed)
Pt notified refill sent

## 2023-08-28 ENCOUNTER — Other Ambulatory Visit: Payer: Self-pay | Admitting: Pulmonary Disease

## 2023-08-30 ENCOUNTER — Telehealth: Payer: Self-pay | Admitting: Nurse Practitioner

## 2023-08-30 NOTE — Telephone Encounter (Signed)
I have tried to reach out to the [place] rep Oletha Cruel) to see what pharmacy to send the script into. I was not able to leave him a voicemail. I will have to try again later.

## 2023-08-30 NOTE — Telephone Encounter (Signed)
I spoke with Leslie Sims. He will have someone from his office contact me regarding Leslie Sims.

## 2023-08-30 NOTE — Telephone Encounter (Signed)
Patient states RX for Sharyn Blitz needs to be sent to [email or web address]. Phone number is [phone]. Patient phone number is [phone].

## 2023-08-31 NOTE — Telephone Encounter (Signed)
I spoke with Leslie Sims from [place]. She said they did not receive the patient's initial paperwork that was faxed on 08/05/2023. I have re-faxed the patient's paperwork and notified the patient.   Nothing further needed.

## 2023-09-20 ENCOUNTER — Other Ambulatory Visit: Payer: Self-pay | Admitting: Pulmonary Disease

## 2023-09-20 ENCOUNTER — Ambulatory Visit: Payer: Medicare Other | Attending: Cardiovascular Disease | Admitting: Cardiovascular Disease

## 2023-09-20 ENCOUNTER — Encounter: Payer: Self-pay | Admitting: Cardiovascular Disease

## 2023-09-20 VITALS — BP 104/58 | HR 72 | Ht 70.0 in | Wt 192.1 lb

## 2023-09-20 DIAGNOSIS — I10 Essential (primary) hypertension: Secondary | ICD-10-CM | POA: Diagnosis present

## 2023-09-20 DIAGNOSIS — E785 Hyperlipidemia, unspecified: Secondary | ICD-10-CM | POA: Diagnosis present

## 2023-09-20 DIAGNOSIS — I2510 Atherosclerotic heart disease of native coronary artery without angina pectoris: Secondary | ICD-10-CM | POA: Insufficient documentation

## 2023-09-20 DIAGNOSIS — I493 Ventricular premature depolarization: Secondary | ICD-10-CM | POA: Diagnosis present

## 2023-09-20 DIAGNOSIS — I255 Ischemic cardiomyopathy: Secondary | ICD-10-CM | POA: Diagnosis present

## 2023-09-20 DIAGNOSIS — I5022 Chronic systolic (congestive) heart failure: Secondary | ICD-10-CM | POA: Insufficient documentation

## 2023-09-20 NOTE — Patient Instructions (Signed)
Medication Instructions:  No changes *If you need a refill on your cardiac medications before your next appointment, please call your pharmacy*   Lab Work: None ordered If you have labs (blood work) drawn today and your tests are completely normal, you will receive your results only by: MyChart Message (if you have MyChart) OR A paper copy in the mail If you have any lab test that is abnormal or we need to change your treatment, we will call you to review the results.   Testing/Procedures: None ordered   Follow-Up: At [facility], you and your health needs are our priority.  As part of our continuing mission to provide you with exceptional heart care, we have created designated Provider Care Teams.  These Care Teams include your primary Cardiologist (physician) and Advanced Practice Providers (APPs -  Physician Assistants and Nurse Practitioners) who all work together to provide you with the care you need, when you need it.  We recommend signing up for the patient portal called "MyChart".  Sign up information is provided on this After Visit Summary.  MyChart is used to connect with patients for Virtual Visits (Telemedicine).  Patients are able to view lab/test results, encounter notes, upcoming appointments, etc.  Non-urgent messages can be sent to your provider as well.   To learn more about what you can do with MyChart, go to [email or web address].    Your next appointment:   9 month(s)  Provider:   You may see Muhammad Arida, MD or one of the following Advanced Practice Providers on your designated Care Team:   Christopher Berge, NP Ryan Dunn, PA-C Cadence Furth, PA-C Sheri Hammock, NP    

## 2023-09-20 NOTE — Progress Notes (Signed)
Cardiology Office Note   Date:  09/20/2023   ID:  [name], DOB 1951-08-30, MRN [id number]  PCP:  [name], MD  Cardiologist:   Lorine Bears, MD   Chief Complaint  Patient presents with   Follow-up    OD 2 wk f/u c/o chest discomfort worsening. Meds reviewed verbally with pt.      History of Present Illness: Leslie Sims is a 72 y.o. male who presents for a follow-up visit regarding coronary artery disease and ischemic cardiomyopathy.  He has known history of severe COPD on home oxygen, prolonged history of previous tobacco use, anxiety/depression and hyperlipidemia. He is status post anterior myocardial infarction in 2010 with finding of an occluded LAD and a 99% stenosis in the RCA.  He was treated with drug-eluting stent placement to the LAD and RCA at that time.   He quit smoking in 2020.   He had prostate cancer that was treated with radiation therapy.  He did not tolerate any beta-blocker due to worsening shortness of breath.  Most recent cardiac catheterization in April of this year showed widely patent LAD and RCA stents with minimal restenosis with mild nonobstructive disease overall.  Ejection fraction was 30 to 35%.  The patient was seen by Dr. Shirlee Latch and his heart failure medications were optimized.  He feels significantly better with less shortness of breath.  He continues to have atypical left-sided chest pain that is not consistent with angina.  Past Medical History:  Diagnosis Date   Allergic rhinitis    Anxiety    Back pain    Cancer (HCC)    prostate   CHF (congestive heart failure) (HCC)    Colon polyps 08/2012   colonoscopy; multiple colon polyps; repeat colonoscopy in one year.   Complication of anesthesia    COPD (chronic obstructive pulmonary disease) (HCC)    a. on home O2.   Coronary artery disease 11/2009   a. late presenting ant MI; b. LHC 100% pLAD s/p PCI/DES, 99% mRCA s/p PCI/DES, EF 35%; c. nuclear stress test  05/13: prior ant/inf infarcts w/o ischemia, EF 43%; d. LHC 02/14: widely patent stents with no other obs dz, EF 40%; e. LHC 11/17: LM nl, mLAD 10%, patent LAD stent, dLAD 20%, p-mRCA 10%, mRCA 40%, patent RCA stent, EF 35-45%    Depression    Diabetes (HCC)    Dysphagia    Family history of adverse reaction to anesthesia    GERD (gastroesophageal reflux disease)    Headache(784.0)    Helicobacter pylori (H. pylori)    Hemorrhoids    Hernia    inguinal   HFrEF (heart failure with reduced ejection fraction) (HCC)    a. 10/2016 LV gram: EF 35-45%; b. 06/2018 Echo: EF 40-45%. c. 01/2021 Echo: EF 40-45%   Hyperlipidemia    Hypertension    Insomnia    Ischemic cardiomyopathy    a. 10/2016 LV gram: EF 35-45%; b. 06/2018 Echo: EF 40-45%.   Myalgia    Oxygen dependent    Peptic ulcer    Pulmonary nodules    ST elevation (STEMI) myocardial infarction involving left anterior descending coronary artery (HCC) 11/2009   a. s/p PCI to the LAD   Status post dilation of esophageal narrowing 2000   Thickening of esophagus    Tinea pedis    Wears dentures    partial upper    Past Surgical History:  Procedure Laterality Date    coronary stents  Admission  12/20/2012   COPD exacerbation.  [facility].   CARDIAC CATHETERIZATION     CARDIAC CATHETERIZATION  02/05/2013   [facility]   CARDIAC CATHETERIZATION  09/19/2013   [facility] : patent stents with no change in anatomy. EF: 40$   CARDIAC CATHETERIZATION Left 11/12/2016   Procedure: Left Heart Cath and Coronary Angiography;  Surgeon: Iran Ouch, MD;  Location: [facility] INVASIVE CV LAB;  Service: Cardiovascular;  Laterality: Left;   COLONOSCOPY     COLONOSCOPY WITH PROPOFOL N/A 05/18/2016   Procedure: COLONOSCOPY WITH PROPOFOL;  Surgeon: Midge Minium, MD;  Location: [facility] ENDOSCOPY;  Service: Endoscopy;  Laterality: N/A;   COLONOSCOPY WITH PROPOFOL N/A 03/19/2020   Procedure: COLONOSCOPY WITH PROPOFOL;  Surgeon: Toney Reil, MD;  Location: [facility]  ENDOSCOPY;  Service: Gastroenterology;  Laterality: N/A;   CORONARY ANGIOPLASTY WITH STENT PLACEMENT  09/19/2009   LAD 3.0 X23 mm Xience DES, RCA: 4.0 X 15 mm Xience DES   ELECTROMAGNETIC NAVIGATION BROCHOSCOPY N/A 10/27/2017   Procedure: ELECTROMAGNETIC NAVIGATION BRONCHOSCOPY;  Surgeon: Erin Fulling, MD;  Location: [facility] ORS;  Service: Cardiopulmonary;  Laterality: N/A;   ESOPHAGEAL DILATION     ESOPHAGOGASTRODUODENOSCOPY  12/20/2006   ESOPHAGOGASTRODUODENOSCOPY  08/20/2012   ESOPHAGOGASTRODUODENOSCOPY (EGD) WITH PROPOFOL N/A 04/19/2022   Procedure: ESOPHAGOGASTRODUODENOSCOPY (EGD) WITH PROPOFOL;  Surgeon: Regis Bill, MD;  Location: [facility] ENDOSCOPY;  Service: Endoscopy;  Laterality: N/A;   ESOPHAGOGASTRODUODENOSCOPY (EGD) WITH PROPOFOL N/A 04/27/2022   Procedure: ESOPHAGOGASTRODUODENOSCOPY (EGD) WITH PROPOFOL;  Surgeon: Regis Bill, MD;  Location: [facility] ENDOSCOPY;  Service: Endoscopy;  Laterality: N/A;   ESOPHAGOGASTRODUODENOSCOPY (EGD) WITH PROPOFOL N/A 11/08/2022   Procedure: ESOPHAGOGASTRODUODENOSCOPY (EGD) WITH PROPOFOL;  Surgeon: Regis Bill, MD;  Location: [facility] ENDOSCOPY;  Service: Endoscopy;  Laterality: N/A;   EUS N/A 05/20/2022   Procedure: UPPER ENDOSCOPIC ULTRASOUND (EUS) LINEAR;  Surgeon: Doren Custard, MD;  Location: [facility] ENDOSCOPY;  Service: Gastroenterology;  Laterality: N/A;  [facility]   HERNIA REPAIR  07/20/2012   L inguinal hernia repair   PENILE PROSTHESIS IMPLANT     RIGHT/LEFT HEART CATH AND CORONARY ANGIOGRAPHY Bilateral 04/04/2023   Procedure: RIGHT/LEFT HEART CATH AND CORONARY ANGIOGRAPHY;  Surgeon: Iran Ouch, MD;  Location: [facility] INVASIVE CV LAB;  Service: Cardiovascular;  Laterality: Bilateral;     Current Outpatient Medications  Medication Sig Dispense Refill   albuterol (VENTOLIN HFA) 108 (90 Base) MCG/ACT inhaler TAKE 2 PUFFS BY MOUTH EVERY 6 HOURS AS NEEDED FOR WHEEZE OR SHORTNESS OF BREATH 18 each 2   ALPRAZolam (XANAX) 0.5 MG  tablet Take 0.5 mg by mouth 3 (three) times daily.     aspirin 81 MG tablet Take 81 mg by mouth daily.     atorvastatin (LIPITOR) 80 MG tablet Take 1 tablet (80 mg total) by mouth daily. 90 tablet 3   bisoprolol (ZEBETA) 5 MG tablet Take 0.5 tablets (2.5 mg total) by mouth daily. 15 tablet 6   budesonide (PULMICORT) 0.5 MG/2ML nebulizer solution Take 2 mLs (0.5 mg total) by nebulization in the morning and at bedtime. 360 mL 10   dapagliflozin propanediol (FARXIGA) 10 MG TABS tablet Take 1 tablet (10 mg total) by mouth daily before breakfast. 30 tablet 6   Ensifentrine (OHTUVAYRE) 3 MG/2.5ML SUSP Inhale 3 mLs into the lungs 2 (two) times daily. DX:J44.9 360 mL 0   fluticasone (FLONASE) 50 MCG/ACT nasal spray Place 2 sprays into both nostrils daily.     furosemide (LASIX) 20 MG tablet Take 1 tablet (20 mg total) by  mouth as needed. 30 tablet 1   ipratropium-albuterol (DUONEB) 0.5-2.5 (3) MG/3ML SOLN Take 3 mLs by nebulization every 6 (six) hours as needed. 360 mL 11   ketoconazole (NIZORAL) 2 % cream Apply topically 2 (two) times daily.     loratadine (CLARITIN) 10 MG tablet Take 10 mg by mouth daily.     mirtazapine (REMERON) 45 MG tablet Take 45 mg by mouth at bedtime.     Multiple Vitamin (MULTIVITAMIN) capsule Take 1 capsule by mouth daily.     nitroGLYCERIN (NITROSTAT) 0.4 MG SL tablet Place 1 tablet (0.4 mg total) under the tongue every 5 (five) minutes as needed. 25 tablet 3   OXYGEN Inhale 3 L into the lungs daily.     pantoprazole (PROTONIX) 40 MG tablet TAKE 1 TABLET (40 MG TOTAL) BY MOUTH TWICE A DAY BEFORE MEALS 180 tablet 1   sacubitril-valsartan (ENTRESTO) 24-26 MG Take 1 tablet by mouth 2 (two) times daily. 60 tablet 6   Spacer/Aero-Holding Chambers (AEROCHAMBER MV) inhaler Use as instructed 1 each 2   spironolactone (ALDACTONE) 25 MG tablet Take 0.5 tablets (12.5 mg total) by mouth every evening. 30 tablet 6   sucralfate (CARAFATE) 1 g tablet Take 1 tablet (1 g total) by mouth 3  (three) times daily with meals. 1 hour before meals and medications 90 tablet 2   No current facility-administered medications for this visit.    Allergies:   Prozac [fluoxetine hcl], Hydroxyzine, Bupropion, and Effexor xr [venlafaxine hcl er]    Social History:  The patient  reports that he quit smoking about 4 years ago. His smoking use included cigarettes. He started smoking about 46 years ago. He has a 126 pack-year smoking history. He has never used smokeless tobacco. He reports that he does not drink alcohol and does not use drugs.   Family History:  The patient's family history includes Alcohol abuse in his sister and sister; COPD in his sister; COPD (age of onset: 81) in his mother; Coronary artery disease (age of onset: 72) in his father; Diabetes in his brother, brother, and father; Heart attack in his brother and father; Heart disease in his father; Hypertension in his brother; Lung cancer in his sister; Penile cancer in his brother.    ROS:  Please see the history of present illness.   Otherwise, review of systems are positive for none.   All other systems are reviewed and negative.    PHYSICAL EXAM: VS:  BP (!) 104/58 (BP Location: Left Arm, Patient Position: Sitting, Cuff Size: Normal)   Pulse 72   Ht 5\' 10"  (1.778 m)   Wt 192 lb 2 oz (87.1 kg)   SpO2 95%   BMI 27.57 kg/m  , BMI Body mass index is 27.57 kg/m. GEN: Well nourished, well developed, in no acute distress  HEENT: normal  Neck: no JVD, carotid bruits, or masses Cardiac: RRR; no murmurs, rubs, or gallops, trace edema  Respiratory:  clear to auscultation bilaterally with diminished breath sounds, normal work of breathing GI: soft, nontender, nondistended, + BS MS: no deformity or atrophy  Skin: warm and dry, no rash Neuro:  Strength and sensation are intact Psych: euthymic mood, full affect  EKG:  EKG is  ordered today. EKG showed: Normal sinus rhythm Left anterior fascicular block Cannot rule out  Anterior infarct , age undetermined T wave abnormality, consider lateral ischemia      Recent Labs: 11/14/2022: Magnesium 2.1 07/22/2023: ALT 26; Hemoglobin 13.1; Platelet Count 263 08/10/2023: BNP  25.0; BUN 15; Creatinine, Ser 0.95; Potassium 4.4; Sodium 141    Lipid Panel    Component Value Date/Time   CHOL 127 07/07/2023 1123   CHOL 143 07/27/2017 1525   TRIG 114 07/07/2023 1123   TRIG 65 01/15/2010 0000   HDL 46 07/07/2023 1123   HDL 62 07/27/2017 1525   CHOLHDL 2.8 07/07/2023 1123   VLDL 23 07/07/2023 1123   LDLCALC 58 07/07/2023 1123   LDLCALC 67 07/27/2017 1525      Wt Readings from Last 3 Encounters:  09/20/23 192 lb 2 oz (87.1 kg)  08/10/23 192 lb 8 oz (87.3 kg)  08/05/23 191 lb 3.2 oz (86.7 kg)        ASSESSMENT AND PLAN:  1.  Coronary artery disease involving native coronary arteries without angina: Status post prior anterior infarct with drug-eluting stent placement to the LAD and RCA in 2010.  Most recent cardiac catheterization in April of this year showed no evidence of obstructive disease.  Continue medical therapy.  2.    Chronic systolic heart failure due to ischemic cardiomyopathy with moderately reduced ejection fraction: He is now managed by Dr. Shirlee Latch and is doing well with medical therapy.  He appears to be euvolemic.   3.  Hyperlipidemia: Most recent lipid profile in July showed an LDL of 58 which is at target.  Continue atorvastatin.  4.  Severe COPD: Followed by pulmonary: He is on home oxygen.    5.  Asymptomatic PVCs: He is tolerating bisoprolol.    Disposition:   FU with me in 9 months  Signed,  Lorine Bears, MD  09/20/2023 1:59 PM     [facility]

## 2023-10-09 ENCOUNTER — Other Ambulatory Visit: Payer: Self-pay | Admitting: Pulmonary Disease

## 2023-10-10 ENCOUNTER — Encounter: Payer: Self-pay | Admitting: Nurse Practitioner

## 2023-10-10 ENCOUNTER — Ambulatory Visit (INDEPENDENT_AMBULATORY_CARE_PROVIDER_SITE_OTHER): Payer: Medicare Other | Admitting: Nurse Practitioner

## 2023-10-10 VITALS — BP 102/58 | HR 75 | Temp 97.8°F | Ht 70.0 in | Wt 191.8 lb

## 2023-10-10 DIAGNOSIS — J9611 Chronic respiratory failure with hypoxia: Secondary | ICD-10-CM

## 2023-10-10 DIAGNOSIS — F17200 Nicotine dependence, unspecified, uncomplicated: Secondary | ICD-10-CM

## 2023-10-10 DIAGNOSIS — I5022 Chronic systolic (congestive) heart failure: Secondary | ICD-10-CM | POA: Diagnosis not present

## 2023-10-10 DIAGNOSIS — J449 Chronic obstructive pulmonary disease, unspecified: Secondary | ICD-10-CM

## 2023-10-10 DIAGNOSIS — C159 Malignant neoplasm of esophagus, unspecified: Secondary | ICD-10-CM

## 2023-10-10 DIAGNOSIS — K219 Gastro-esophageal reflux disease without esophagitis: Secondary | ICD-10-CM | POA: Diagnosis not present

## 2023-10-10 MED ORDER — DEXLANSOPRAZOLE 60 MG PO CPDR
60.0000 mg | DELAYED_RELEASE_CAPSULE | Freq: Every day | ORAL | 5 refills | Status: DC
Start: 2023-10-10 — End: 2024-02-15

## 2023-10-10 NOTE — Assessment & Plan Note (Signed)
Previous history of esophageal cancer.  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  Unfortunately, continues to have symptoms despite PPI regimen.  Will trial him on Dexilant.  Previously had better response to lansoprazole but had some abdominal discomfort with it.  Will have to monitor him on the Dexilant to see if he is able to tolerate it or responds the same way.  Advised him to notify us of any side effects.  Also encouraged him to utilize H2 blocker with Pepcid for breakthrough symptoms.  He will keep his appointment with GI.  Also follows with radiation oncology with previous stable scans.

## 2023-10-10 NOTE — Assessment & Plan Note (Signed)
Stable without increased O2 requirement.  Goal greater than 88 to 90%.

## 2023-10-10 NOTE — Progress Notes (Signed)
@Patient  ID: Leslie Sims, male    DOB: 22-Oct-1951, 72 y.o.   MRN: [id number]  Chief Complaint  Patient presents with   Follow-up    Cough, shortness of breath and wheezing.     Referring provider: [name], MD  HPI: 72 year old male, former heavy smoker followed for very severe COPD and chronic respiratory failure on supplemental oxygen.  He is a patient Dr. [name] and last seen in office 08/05/2023 by [facility] NP.  Past medical history significant for hypertension, CAD, CHF, allergic rhinitis, GERD, hx of esophageal cancer, HLD, depression, anxiety, IDA.   TEST/EVENTS:  01/29/2021 PFT: FVC 42, FEV1 22, ratio 37, DLCO 21 corrects to 45% for alveolar volume.  No bronchodilator response.  Very severe obstructive disease with severe diffusing defect. 04/04/2023 RHC/LHC: Mild nonobstructive CAD.  EF 25 to 35%.  Normal RA pressure, mild pulmonary hypertension, mildly elevated wedge pressure and normal cardiac output. 05/02/2023 echocardiogram: EF 35 to 40%.  Mild LVH.  G1 DD.  Severe hypokinesis.  RV size and function is normal.  LA mildly dilated. 07/22/2023 CT chest: Atherosclerosis/CAD.  Mid esophagus is mildly dilated and fluid-filled.  Mild wall thickening involving the distal esophagus.  Moderate centrilobular emphysema.  Probable secretions in the right side of the trachea.  Right upper lobe scarring.  Mild focal thickening is unchanged.  Left upper lobe centrally calcified 10 mm nodule, unchanged and likely granuloma.  Probable aspirated contrast in the right lower lobe, similar.  Superior segment left lower lobe calcified granuloma.  No pulmonary metastases.  05/31/2023: OV with Dr. [name].  Last seen March 2024.  Since that time has had 7 visits to the ED due to burning chest pain and shortness of breath.  Last ED visit was 05/28/2023.  Gets no relief from steroids or antibiotics.  Only partial relief from nebulizers.  Since that time, has noted to have significant drop in heart  function.  Has been referred to the advanced heart failure clinic.  Currently appears compensated.  Advised to continue DuoNebs and budesonide.  Increase pulmonary hygiene and add Acapella device on.  Compliant with oxygen.  Suspect recent issues with dyspnea likely related to combined systolic and diastolic heart failure.  Esophageal dysmotility is likely adding to complexity of his management.  Discontinue lansoprazole.  Start pantoprazole twice daily.  Recommend follow-up with GI.  08/05/2023: OV with Nelli Swalley NP for follow-up.  Since he was here last, he was seen in the emergency department on 07/15/2023 at [facility] and provided azithromycin and prednisone for AECOPD/bronchitis.  He then went to urgent care on 07/31/2023 with complaints of burning sensation in center of his chest, increased shortness of breath and cough.  Felt as though this was consistent with his prior COPD exacerbations.  Chest x-ray did not show any acute process.  He was started on Augmentin and prednisone taper, which she is still on today.  He does tell me that he is feeling some better.  Still has trouble with his breathing during any sort of activity but feels like it is closer to his baseline.  Sputum production has improved.  He still having an occasional mild, dry cough.  Chest congestion also feels better.  Has not noticed as much wheezing.  Denies any fevers, chills, hemoptysis.  His weights have been stable.  Not noticing any leg swelling or orthopnea.  Has not had any increased oxygen requirements.  He is using DuoNebs 4 times a day and budesonide twice a day.  He  is taking the Protonix for his reflux.  Felt like the lansoprazole was working better for him but it gave him stomach upset. Denies any difficulties swallowing, nausea, vomiting, hoarse voice quality.  He informed me today that he is actually taking azithromycin 3 times a week for the last couple of years.  He has not noticed a significant difference in his symptoms.  He has  still had recurrent exacerbations.  He wants to know if he needs to continue taking this.  10/10/2023: Today - follow up Patient presents today for follow-up.  After his last visit, he started on inhaled phosphodiesterase inhibitor with Ohtuvayre.  Fortunately, he has not had a exacerbation since his last visit.  This is an improvement for him as he was frequently in the emergency department with recurrent AECOPD/bronchitis episodes.  Feels like the new nebulizer treatments are helping him a little bit.  He does still have shortness of breath daily and an occasional cough.  Cough is nonproductive for the most part.  He is also still having difficulties with reflux symptoms.  He has upcoming appointment with GI but it is not until January.  Denies any fevers, chills, hemoptysis, anorexia, difficulty swallowing, or hoarse voice.  He is still taking his DuoNebs every 6 hours and budesonide twice a day.  He is on pantoprazole and Carafate for reflux.  He was previously on lansoprazole which worked better for him.  No increased O2 requirement.  Allergies  Allergen Reactions   Prozac [Fluoxetine Hcl] Shortness Of Breath   Hydroxyzine    Bupropion Other (See Comments)    "Makes me feel funny" Other reaction(s): Other (See Comments) Unknown/"made me feel real funny" "Makes me feel funny" "Makes me feel funny" Other reaction(s): Other (See Comments) Unknown/"made me feel real funny" "Makes me feel funny"   Effexor Xr [Venlafaxine Hcl Er] Other (See Comments)    "Makes me feel funny"    Immunization History  Administered Date(s) Administered   Fluad Quad(high Dose 65+) 11/16/2021, 11/05/2022   Influenza Whole 09/19/2012   Influenza, High Dose Seasonal PF 09/28/2019   Influenza,inj,Quad PF,6+ Mos 10/24/2013, 11/25/2014, 03/02/2016, 10/27/2016, 10/05/2017   Influenza-Unspecified 10/24/2013, 11/25/2014, 03/02/2016, 10/27/2016, 10/05/2017, 09/18/2018, 09/28/2019, 09/30/2020   Moderna Covid-19 Fall  Seasonal Vaccine 59yrs & older 11/05/2022, 03/29/2023   Moderna Covid-19 Seasonal Vaccine 6 months thru 70years of age 03/05/2022   Moderna Sars-Covid-2 Vaccination 01/26/2020, 02/23/2020   PFIZER Comirnaty(Gray Top)Covid-19 Tri-Sucrose Vaccine 12/15/2020, 07/15/2021   PFIZER(Purple Top)SARS-COV-2 Vaccination 01/26/2020, 02/23/2020   PNEUMOCOCCAL CONJUGATE-20 03/22/2022   Pfizer Covid-19 Vaccine Bivalent Booster 58yrs & up 01/14/2022, 05/25/2022   Pneumococcal Conjugate-13 11/25/2014   Pneumococcal Polysaccharide-23 08/24/2016, 10/27/2016   Pneumococcal-Unspecified 12/20/2008   Rsv, Bivalent, Protein Subunit Rsvpref,pf Verdis Frederickson) 02/20/2023   Tdap 12/20/2008    Past Medical History:  Diagnosis Date   Allergic rhinitis    Anxiety    Back pain    Cancer (HCC)    prostate   CHF (congestive heart failure) (HCC)    Colon polyps 08/2012   colonoscopy; multiple colon polyps; repeat colonoscopy in one year.   Complication of anesthesia    COPD (chronic obstructive pulmonary disease) (HCC)    a. on home O2.   Coronary artery disease 11/2009   a. late presenting ant MI; b. LHC 100% pLAD s/p PCI/DES, 99% mRCA s/p PCI/DES, EF 35%; c. nuclear stress test 05/13: prior ant/inf infarcts w/o ischemia, EF 43%; d. LHC 02/14: widely patent stents with no other obs dz, EF 40%; e. LHC  11/17: LM nl, mLAD 10%, patent LAD stent, dLAD 20%, p-mRCA 10%, mRCA 40%, patent RCA stent, EF 35-45%    Depression    Diabetes (HCC)    Dysphagia    Family history of adverse reaction to anesthesia    GERD (gastroesophageal reflux disease)    Headache(784.0)    Helicobacter pylori (H. pylori)    Hemorrhoids    Hernia    inguinal   HFrEF (heart failure with reduced ejection fraction) (HCC)    a. 10/2016 LV gram: EF 35-45%; b. 06/2018 Echo: EF 40-45%. c. 01/2021 Echo: EF 40-45%   Hyperlipidemia    Hypertension    Insomnia    Ischemic cardiomyopathy    a. 10/2016 LV gram: EF 35-45%; b. 06/2018 Echo: EF 40-45%.    Myalgia    Oxygen dependent    Peptic ulcer    Pulmonary nodules    ST elevation (STEMI) myocardial infarction involving left anterior descending coronary artery (HCC) 11/2009   a. s/p PCI to the LAD   Status post dilation of esophageal narrowing 2000   Thickening of esophagus    Tinea pedis    Wears dentures    partial upper    Tobacco History: Social History   Tobacco Use  Smoking Status Former   Current packs/day: 0.00   Average packs/day: 3.0 packs/day for 42.0 years (126.0 ttl pk-yrs)   Types: Cigarettes   Start date: 02/10/1977   Quit date: 02/10/2019   Years since quitting: 4.6  Smokeless Tobacco Never  Tobacco Comments   25 months ago most smoked 4 packs a day .   Counseling given: Not Answered Tobacco comments: 25 months ago most smoked 4 packs a day .   Outpatient Medications Prior to Visit  Medication Sig Dispense Refill   albuterol (VENTOLIN HFA) 108 (90 Base) MCG/ACT inhaler TAKE 2 PUFFS BY MOUTH EVERY 6 HOURS AS NEEDED FOR WHEEZE OR SHORTNESS OF BREATH 18 each 2   ALPRAZolam (XANAX) 0.5 MG tablet Take 0.5 mg by mouth 3 (three) times daily.     aspirin 81 MG tablet Take 81 mg by mouth daily.     atorvastatin (LIPITOR) 80 MG tablet Take 1 tablet (80 mg total) by mouth daily. 90 tablet 3   bisoprolol (ZEBETA) 5 MG tablet Take 0.5 tablets (2.5 mg total) by mouth daily. 15 tablet 6   budesonide (PULMICORT) 0.5 MG/2ML nebulizer solution Take 2 mLs (0.5 mg total) by nebulization in the morning and at bedtime. 360 mL 10   dapagliflozin propanediol (FARXIGA) 10 MG TABS tablet Take 1 tablet (10 mg total) by mouth daily before breakfast. 30 tablet 6   Ensifentrine (OHTUVAYRE) 3 MG/2.5ML SUSP Inhale 3 mLs into the lungs 2 (two) times daily. DX:J44.9 360 mL 0   fluticasone (FLONASE) 50 MCG/ACT nasal spray Place 2 sprays into both nostrils daily.     furosemide (LASIX) 20 MG tablet Take 1 tablet (20 mg total) by mouth as needed. 30 tablet 1   ipratropium-albuterol (DUONEB)  0.5-2.5 (3) MG/3ML SOLN Take 3 mLs by nebulization every 6 (six) hours as needed. 360 mL 11   ketoconazole (NIZORAL) 2 % cream Apply topically 2 (two) times daily.     loratadine (CLARITIN) 10 MG tablet Take 10 mg by mouth daily.     mirtazapine (REMERON) 45 MG tablet Take 45 mg by mouth at bedtime.     Multiple Vitamin (MULTIVITAMIN) capsule Take 1 capsule by mouth daily.     nitroGLYCERIN (NITROSTAT) 0.4 MG SL tablet  Place 1 tablet (0.4 mg total) under the tongue every 5 (five) minutes as needed. 25 tablet 3   OXYGEN Inhale 3 L into the lungs daily.     sacubitril-valsartan (ENTRESTO) 24-26 MG Take 1 tablet by mouth 2 (two) times daily. 60 tablet 6   Spacer/Aero-Holding Chambers (AEROCHAMBER MV) inhaler Use as instructed 1 each 2   spironolactone (ALDACTONE) 25 MG tablet Take 0.5 tablets (12.5 mg total) by mouth every evening. 30 tablet 6   sucralfate (CARAFATE) 1 g tablet Take 1 tablet (1 g total) by mouth 3 (three) times daily with meals. 1 hour before meals and medications 90 tablet 2   pantoprazole (PROTONIX) 40 MG tablet TAKE 1 TABLET (40 MG TOTAL) BY MOUTH TWICE A DAY BEFORE MEALS 180 tablet 1   No facility-administered medications prior to visit.     Review of Systems:   Constitutional: No weight loss or gain, night sweats, fevers, chills, or lassitude. +fatigue (baseline) HEENT: No headaches, difficulty swallowing, tooth/dental problems, or sore throat. No sneezing, itching, ear ache, nasal congestion, or post nasal drip CV:  No chest pain, orthopnea, PND, swelling in lower extremities, anasarca, dizziness, palpitations, syncope Resp: +shortness of breath with exertion; cough. No wheeze. No excess mucus or change in color of mucus. No hemoptysis. No chest wall deformity GI:  +heartburn, indigestion. No abdominal pain, nausea, vomiting, diarrhea, change in bowel habits, loss of appetite, bloody stools.  GU: No dysuria, change in color of urine, urgency or frequency.   Skin: No rash,  lesions, ulcerations MSK:  No joint pain or swelling.  Neuro: No dizziness or lightheadedness.  Psych: No depression or anxiety. Mood stable.     Physical Exam:  BP (!) 102/58 (BP Location: Left Arm, Patient Position: Sitting, Cuff Size: Normal)   Pulse 75   Temp 97.8 F (36.6 C) (Temporal)   Ht 5\' 10"  (1.778 m)   Wt 191 lb 12.8 oz (87 kg)   SpO2 95%   BMI 27.52 kg/m   GEN: Pleasant, interactive, chronically-ill appearing; in no acute distress. HEENT:  Normocephalic and atraumatic. PERRLA. Sclera white. Nasal turbinates pink, moist and patent bilaterally. No rhinorrhea present. Oropharynx pink and moist, without exudate or edema. No lesions, ulcerations, or postnasal drip.  NECK:  Supple w/ fair ROM. No JVD present. Normal carotid impulses w/o bruits. Thyroid symmetrical with no goiter or nodules palpated. No lymphadenopathy.   CV: RRR, no m/r/g, no peripheral edema. Pulses intact, +2 bilaterally. No cyanosis, pallor or clubbing. PULMONARY:  Unlabored, regular breathing. Diminished bilaterally A&P w/o wheezes/rales/rhonchi. No accessory muscle use.  GI: BS present and normoactive. Soft, non-tender to palpation. No organomegaly or masses detected. MSK: No erythema, warmth or tenderness. Cap refil <2 sec all extrem. No deformities or joint swelling noted.  Neuro: A/Ox3. No focal deficits noted.   Skin: Warm, no lesions or rashe Psych: Normal affect and behavior. Judgement and thought content appropriate.     Lab Results:  CBC    Component Value Date/Time   WBC 6.1 07/22/2023 0959   WBC 5.8 07/15/2023 1340   RBC 4.63 07/22/2023 0959   HGB 13.1 07/22/2023 0959   HGB 15.1 05/22/2018 1601   HCT 41.6 07/22/2023 0959   HCT 44.3 05/22/2018 1601   PLT 263 07/22/2023 0959   PLT 287 05/22/2018 1601   MCV 89.8 07/22/2023 0959   MCV 88 05/22/2018 1601   MCV 86 01/11/2013 0546   MCH 28.3 07/22/2023 0959   MCHC 31.5 07/22/2023 0959  RDW 13.1 07/22/2023 0959   RDW 13.5 05/22/2018  1601   RDW 14.7 (H) 01/11/2013 0546   LYMPHSABS 0.9 07/22/2023 0959   LYMPHSABS 1.8 05/22/2018 1601   LYMPHSABS 0.5 (L) 01/11/2013 0546   MONOABS 0.9 07/22/2023 0959   MONOABS 0.7 01/11/2013 0546   EOSABS 0.3 07/22/2023 0959   EOSABS 0.1 05/22/2018 1601   EOSABS 0.0 01/11/2013 0546   BASOSABS 0.0 07/22/2023 0959   BASOSABS 0.0 05/22/2018 1601   BASOSABS 0.0 01/11/2013 0546    BMET    Component Value Date/Time   NA 141 08/10/2023 1220   NA 140 01/11/2013 0546   K 4.4 08/10/2023 1220   K 4.4 01/11/2013 0546   CL 101 08/10/2023 1220   CL 105 01/11/2013 0546   CO2 25 08/10/2023 1220   CO2 31 01/11/2013 0546   GLUCOSE 105 (H) 08/10/2023 1220   GLUCOSE 116 (H) 07/22/2023 0959   GLUCOSE 145 (H) 01/11/2013 0546   BUN 15 08/10/2023 1220   BUN 10 01/11/2013 0546   CREATININE 0.95 08/10/2023 1220   CREATININE 0.92 07/22/2023 0959   CREATININE 0.92 10/13/2016 1241   CALCIUM 8.9 08/10/2023 1220   CALCIUM 9.2 01/11/2013 0546   GFRNONAA >60 07/22/2023 0959   GFRNONAA >89 01/08/2014 1100   GFRAA >60 07/28/2020 1250   GFRAA >89 01/08/2014 1100    BNP    Component Value Date/Time   BNP 25.0 08/10/2023 1220   BNP 25.0 06/27/2023 1649     Imaging:  No results found.  Administration History     None          Latest Ref Rng & Units 01/29/2021    3:57 PM  PFT Results  FVC-Predicted Pre % 42  P  FVC-Post L 1.83  P  FVC-Predicted Post % 40  P  Pre FEV1/FVC % % 39  P  Post FEV1/FCV % % 37  P  FEV1-Pre L 0.74  P  FEV1-Predicted Pre % 22  P  FEV1-Post L 0.68  P  DLCO uncorrected ml/min/mmHg 5.57  P  DLCO UNC% % 21  P  DLVA Predicted % 45  P    P Preliminary result    No results found for: "NITRICOXIDE"      Assessment & Plan:   COPD, very severe (HCC) Very severe COPD with high symptom burden which is likely multifactorial related to lung disease, cardiac disease and deconditioning.  He also has a component of GERD that exacerbates his dyspnea/cough.  He is  on an aggressive maintenance regimen.  Started on inhaled phosphodiesterase inhibitor at his last visit.  Macrolide therapy was also discontinued due to suboptimal response.  Seems to be somewhat improved.  Will continue to monitor his response.  Action plan in place.  Encouraged him to attend cardiopulmonary rehab.  Offered him flu vaccine today.  He declined as he would like to get it with his primary care provider on November 1.  Patient Instructions  Continue Albuterol inhaler 2 puffs every 6 hours as needed for shortness of breath or wheezing. Notify if symptoms persist despite rescue inhaler/neb use.  Continue duoneb 3 mL every 6 hours  Continue budesonide 2 mL Twice daily. Brush tongue and rinse mouth afterwards Continue supplemental oxygen 4 lpm for goal >88-90% Continue carafate 1 tab Three times a day 1 hour before meals for reflux Continue Ohtuvayre 2.5 mL nebulizer treatment Twice daily    Stop pantoprazole. Start dexilant 60 mg daily. Take 30 minutes before meal in  AM. If this doesn't help, we can go back to the pantoprazole. Also if this upsets your stomach, go back to the pantoprazole.  Famotidine 20 mg Twice daily for breakthrough reflux   Follow up with GI as schedule    Follow up in 3 months with Dr. [name] or Katie Andilynn Delavega,NP. If symptoms do not improve or worsen, please contact office for sooner follow up or seek emergency care.    Chronic respiratory failure with hypoxia (HCC) Stable without increased O2 requirement.  Goal greater than 88 to 90%.  Chronic systolic heart failure (HCC) Euvolemic on exam.  Follow-up with cardiology as scheduled.  GERD Previous history of esophageal cancer.  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  Unfortunately, continues to have symptoms despite PPI regimen.  Will trial him on Dexilant.  Previously had better response to lansoprazole but had some abdominal discomfort with it.  Will have to monitor him on the  Dexilant to see if he is able to tolerate it or responds the same way.  Advised him to notify us of any side effects.  Also encouraged him to utilize H2 blocker with Pepcid for breakthrough symptoms.  He will keep his appointment with GI.  Also follows with radiation oncology with previous stable scans.  Primary esophageal squamous cell carcinoma (HCC) Completed treatment of chemotherapy and radiation August 2023.  No evidence of recurrence.  Under surveillance every 6 months with CT imaging.  Next scan due February 2025.  Follow-up with oncology as scheduled.   SMOKER Former heavy smoker.  Quit in 2020.  He is undergoing surveillance CTs for esophageal cancer every 6 months, which include CT chest.  Counseled to remain smoke-free.    I spent 35 minutes of dedicated to the care of this patient on the date of this encounter to include pre-visit review of records, face-to-face time with the patient discussing conditions above, post visit ordering of testing, clinical documentation with the electronic health record, making appropriate referrals as documented, and communicating necessary findings to members of the patients care team.  Noemi Chapel, NP 10/10/2023  Pt aware and understands NP's role.

## 2023-10-10 NOTE — Assessment & Plan Note (Signed)
Euvolemic on exam. Follow up with cardiology as scheduled

## 2023-10-10 NOTE — Assessment & Plan Note (Signed)
Former heavy smoker.  Quit in 2020.  He is undergoing surveillance CTs for esophageal cancer every 6 months, which include CT chest.  Counseled to remain smoke-free.

## 2023-10-10 NOTE — Assessment & Plan Note (Signed)
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  He also has a component of GERD that exacerbates his dyspnea/cough.  He is on an aggressive maintenance regimen.  Started on inhaled phosphodiesterase inhibitor at his last visit.  Macrolide therapy was also discontinued due to suboptimal response.  Seems to be somewhat improved.  Will continue to monitor his response.  Action plan in place.  Encouraged him to attend cardiopulmonary rehab.  Offered him flu vaccine today.  He declined as he would like to get it with his primary care provider on November 1.  Patient Instructions  Continue Albuterol inhaler 2 puffs every 6 hours as needed for shortness of breath or wheezing. Notify if symptoms persist despite rescue inhaler/neb use.  Continue duoneb 3 mL every 6 hours  Continue budesonide 2 mL Twice daily. Brush tongue and rinse mouth afterwards Continue supplemental oxygen 4 lpm for goal >88-90% Continue carafate 1 tab Three times a day 1 hour before meals for reflux Continue Ohtuvayre 2.5 mL nebulizer treatment Twice daily    Stop pantoprazole. Start dexilant 60 mg daily. Take 30 minutes before meal in AM. If this doesn't help, we can go back to the pantoprazole. Also if this upsets your stomach, go back to the pantoprazole.  Famotidine 20 mg Twice daily for breakthrough reflux   Follow up with GI as schedule    Follow up in 3 months with Dr. [name] or Katie Xaden Kaufman,NP. If symptoms do not improve or worsen, please contact office for sooner follow up or seek emergency care.

## 2023-10-10 NOTE — Assessment & Plan Note (Signed)
Completed treatment of chemotherapy and radiation August 2023.  No evidence of recurrence.  Under surveillance every 6 months with CT imaging.  Next scan due February 2025.  Follow-up with oncology as scheduled.

## 2023-10-10 NOTE — Progress Notes (Signed)
Agree with the details of the visit as noted by Katherine Cobb, NP. ° °C. Laura Yasin Ducat, MD °Advanced Bronchoscopy °[facility] ° °

## 2023-10-10 NOTE — Patient Instructions (Addendum)
Continue Albuterol inhaler 2 puffs every 6 hours as needed for shortness of breath or wheezing. Notify if symptoms persist despite rescue inhaler/neb use.  Continue duoneb 3 mL every 6 hours  Continue budesonide 2 mL Twice daily. Brush tongue and rinse mouth afterwards Continue supplemental oxygen 4 lpm for goal >88-90% Continue carafate 1 tab Three times a day 1 hour before meals for reflux Continue Ohtuvayre 2.5 mL nebulizer treatment Twice daily    Stop pantoprazole. Start dexilant 60 mg daily. Take 30 minutes before meal in AM. If this doesn't help, we can go back to the pantoprazole. Also if this upsets your stomach, go back to the pantoprazole.  Famotidine 20 mg Twice daily for breakthrough reflux   Follow up with GI as schedule    Follow up in 3 months with Dr. [name] or Katie Seraphine Gudiel,NP. If symptoms do not improve or worsen, please contact office for sooner follow up or seek emergency care.

## 2023-10-11 MED ORDER — FAMOTIDINE 20 MG PO TABS: 20.0000 mg | ORAL_TABLET | Freq: Two times a day (BID) | ORAL | 1 refills | Status: DC

## 2023-10-11 NOTE — Addendum Note (Signed)
Addended by: Bonney Leitz on: 10/11/2023 11:25 AM   Modules accepted: Orders

## 2023-11-01 ENCOUNTER — Other Ambulatory Visit: Payer: Self-pay

## 2023-11-01 ENCOUNTER — Emergency Department: Payer: Medicare Other

## 2023-11-01 ENCOUNTER — Emergency Department
Admission: EM | Admit: 2023-11-01 | Discharge: 2023-11-01 | Disposition: A | Discharge status: Home / Self Care | Payer: Medicare Other | Attending: Emergency Medicine | Admitting: Emergency Medicine

## 2023-11-01 DIAGNOSIS — R0789 Other chest pain: Secondary | ICD-10-CM | POA: Insufficient documentation

## 2023-11-01 LAB — CBC
HCT: 42.6 % (ref 39.0–52.0)
Hemoglobin: 13 g/dL (ref 13.0–17.0)
MCH: 28.2 pg (ref 26.0–34.0)
MCHC: 30.5 g/dL (ref 30.0–36.0)
MCV: 92.4 fL (ref 80.0–100.0)
Platelets: 229 10*3/uL (ref 150–400)
RBC: 4.61 MIL/uL (ref 4.22–5.81)
RDW: 14.2 % (ref 11.5–15.5)
WBC: 6.6 10*3/uL (ref 4.0–10.5)
nRBC: 0 % (ref 0.0–0.2)

## 2023-11-01 LAB — TROPONIN I (HIGH SENSITIVITY)
Troponin I (High Sensitivity): 7 ng/L (ref <18)
Troponin I (High Sensitivity): 7 ng/L (ref <18)

## 2023-11-01 LAB — BASIC METABOLIC PANEL
Anion gap: 8 (ref 5–15)
BUN: 14 mg/dL (ref 8–23)
CO2: 29 mmol/L (ref 22–32)
Calcium: 9.1 mg/dL (ref 8.9–10.3)
Chloride: 104 mmol/L (ref 98–111)
Creatinine, Ser: 0.95 mg/dL (ref 0.61–1.24)
GFR, Estimated: >60 mL/min (ref >60)
Glucose, Bld: 134 mg/dL — ABNORMAL HIGH (ref 70–99)
Potassium: 3.9 mmol/L (ref 3.5–5.1)
Sodium: 141 mmol/L (ref 135–145)

## 2023-11-01 MED ORDER — LIDOCAINE VISCOUS HCL 2 % MT SOLN
15.0000 mL | Freq: Once | OROMUCOSAL | Status: AC
  Administered 2023-11-01: 15 mL via ORAL
  Filled 2023-11-01: qty 15

## 2023-11-01 MED ORDER — ALUM & MAG HYDROXIDE-SIMETH 200-200-20 MG/5ML PO SUSP
30.0000 mL | Freq: Once | ORAL | Status: AC
  Administered 2023-11-01: 30 mL via ORAL
  Filled 2023-11-01: qty 30

## 2023-11-01 NOTE — ED Provider Notes (Signed)
[facility] Provider Note    Event Date/Time   First MD Initiated Contact with Patient 11/01/23 [phone]     (approximate)   History   Chest Pain   HPI  Leslie Sims is a 72 y.o. male  who presents to the emergency department today because of concern for chest and abdominal pain. Patient states that he has been having issues with this pain for months. Told his doctor about it a few months ago and has appointment with GI scheduled in 2 months. The patient states that he is on antacids. Did have a large meal yesterday. Denies any worsening of his breathing with the pain. Denies any vomiting.        Physical Exam   Triage Vital Signs: ED Triage Vitals [11/01/23 0936]  Encounter Vitals Group     BP 132/84     Systolic BP Percentile      Diastolic BP Percentile      Pulse Rate 74     Resp 20     Temp 97.8 F (36.6 C)     Temp src      SpO2 99 %     Weight      Height      Head Circumference      Peak Flow      Pain Score      Pain Loc      Pain Education      Exclude from Growth Chart     Most recent vital signs: Vitals:   11/01/23 0936  BP: 132/84  Pulse: 74  Resp: 20  Temp: 97.8 F (36.6 C)  SpO2: 99%   General: Awake, alert, oriented. CV:  Good peripheral perfusion. Regular rate or rhythm. Resp:  Normal effort. Lungs clear. Abd:  No distention.    ED Results / Procedures / Treatments   Labs (all labs ordered are listed, but only abnormal results are displayed) Labs Reviewed  BASIC METABOLIC PANEL - Abnormal; Notable for the following components:      Result Value   Glucose, Bld 134 (*)    All other components within normal limits  CBC  TROPONIN I (HIGH SENSITIVITY)  TROPONIN I (HIGH SENSITIVITY)     EKG  I, Phineas Semen, attending physician, personally viewed and interpreted this EKG  EKG Time: 0937 Rate: 68 Rhythm: sinus rhythm with fusion complex Axis: normal Intervals: qtc 416 QRS: narrow, q waves  v1 ST changes: no st elevation Impression: abnormal ekg   RADIOLOGY I independently interpreted and visualized the CXR. My interpretation: No pneumonia Radiology interpretation:  IMPRESSION:  Emphysema (ICD10-J43.9). No acute cardiopulmonary abnormality.      PROCEDURES:  Critical Care performed: No    MEDICATIONS ORDERED IN ED: Medications - No data to display   IMPRESSION / MDM / ASSESSMENT AND PLAN / ED COURSE  I reviewed the triage vital signs and the nursing notes.                              Differential diagnosis includes, but is not limited to, ACS, pneumonia, GERD  Patient's presentation is most consistent with acute presentation with potential threat to life or bodily function.   The patient is on the cardiac monitor to evaluate for evidence of arrhythmia and/or significant heart rate changes.  Patient presented to the emergency department today because of concerns for chest pain.  EKG without concerning ST elevation.  Troponin  negative x 2.  Chest x-ray without pneumonia.  Patient did feel better after GI cocktail.  At this time I think GERD esophagitis is likely.  Patient is on an antacid and sucralfate already.  Did encourage patient to continue management with primary care.      FINAL CLINICAL IMPRESSION(S) / ED DIAGNOSES   Final diagnoses:  Atypical chest pain      Note:  This document was prepared using Dragon voice recognition software and may include unintentional dictation errors.    Phineas Semen, MD 11/01/23 [phone]

## 2023-11-01 NOTE — ED Notes (Signed)
Pt taken to Xray and then they will bring to ED 11

## 2023-11-01 NOTE — ED Notes (Signed)
Report received from Dee, RN 

## 2023-11-01 NOTE — ED Triage Notes (Addendum)
Pt comes with mid sternal cp. Pt states he felt lightheaded. Pt states pain all in chest and belly. Pt wears 3L O2. Pt states some sob. Pt not on thinners. Pt states pain is dull.  Pt states this all started months back but has just become worse.

## 2023-11-01 NOTE — ED Notes (Signed)
Pt given graham crackers and water per approval from Dr. Derrill Kay at this time.

## 2023-11-01 NOTE — Discharge Instructions (Signed)
Please seek medical attention for any high fevers, chest pain, shortness of breath, change in behavior, persistent vomiting, bloody stool or any other new or concerning symptoms.  

## 2023-11-02 ENCOUNTER — Other Ambulatory Visit: Payer: Self-pay | Admitting: Nurse Practitioner

## 2023-11-06 ENCOUNTER — Other Ambulatory Visit: Payer: Self-pay | Admitting: Cardiology

## 2023-11-06 DIAGNOSIS — I5032 Chronic diastolic (congestive) heart failure: Secondary | ICD-10-CM

## 2023-11-15 ENCOUNTER — Encounter: Payer: Medicare Other | Admitting: Cardiology

## 2023-11-28 ENCOUNTER — Other Ambulatory Visit: Payer: Self-pay | Admitting: Nurse Practitioner

## 2023-11-28 ENCOUNTER — Telehealth: Payer: Self-pay | Admitting: Cardiology

## 2023-11-28 NOTE — Progress Notes (Unsigned)
ADVANCED HEART FAILURE NEW PATIENT CLINIC NOTE  Referring Physician: [name], MD  Primary Care: [name], MD Primary Cardiologist:  HPI: Leslie Sims is a 72 y.o. male with a PMH of CAD, ischemic cardiomyopathy, COPD, tobacco use, HLD who presents for initial visit for further evaluation and treatment of heart failure/cardiomyopathy.      Patient had an anterior MI in 2010. At that time, he had DES to LAD and to RCA. Repeat caths in 2017 and in 4/24 have shown patent stents and minimal progression of CAD. Echoes showed EF 40-45% generally, then echo in 9/23 showed EF up to 50-55% though study was technically difficult. LV-gram with cath in 4/24 suggested lower EF at 25-35%. Patient was a heavy smoker in the past, quit in 2020. He has severe COPD and wears 3L home oxygen. 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   Most recent cardiac catheterization in April of this year showed widely patent LAD and RCA stents with minimal restenosis with mild nonobstructive disease overall. Ejection fraction was 30 to 35%. The patient was seen by Dr. Shirlee Latch and his heart failure medications were optimized. Seen in the ED 10/2023 for chest pain, cardiac workup unremarkable.      SUBJECTIVE:  Patient reports that overall he is doing fair.  The day he went to the emergency department with burning chest pain he admits to eating multiple pieces of garlic bread as well as 5 hotdogs the night before which probably contributed to his GERD.  He otherwise has been dealing with his brother-in-law being diagnosed with throat cancer, which is difficult for him.  He gets around the house okay, and reports that his lung disease is somewhat improved, though has had to go on antibiotics and steroids recently.  He missed his appointment last month and so was scheduled with me in clinic today.  PMH, current  medications, allergies, social history, and family history reviewed in epic.  PHYSICAL EXAM: Vitals:   11/29/23 1321  BP: (!) 139/58  Pulse: 84  SpO2: 94%   GENERAL: Well nourished and in no apparent distress at rest.  HEENT: The mucous membranes are pink and moist.   PULM: Diminished breath sounds, mild end expiratory wheezing, mildly increased work of breathing CARDIAC:  JVP: Not elevated         Normal rate with regular rhythm. No murmurs, rubs or gallops.  No lower extremity edema.  ABDOMEN: Soft, non-tender, non-distended. NEUROLOGIC: Patient is oriented x3 with no focal or lateralizing neurologic deficits.  PSYCH: Patients affect is appropriate, there is no evidence of anxiety or depression.  SKIN: Warm and dry; no lesions or wounds. Warm and well perfused extremities.    ASSESSMENT & PLAN:  Chronic systolic CHF: Ischemic cardiomyopathy.  Historically, EF has been in the 40-45% range, but echo in 9/23 showed EF 50-55% (technically difficult study).  LV-gram with cath in 4/24 suggested that EF may be down to the 25-35% range.  He had no new coronary disease on cath in 4/24 to explain fall in EF.  Echo in 5/24 showed EF 35-40%, mild LV dilation, normal RV.  NYHA class III symptoms, but this is confounded by severe COPD.  Not volume overloaded on exam. -Increase spironolactone to 25 mg daily, labs with next PCP appointment  - Continue Entresto 24/26 bid.  - Continue Farxiga 10 mg daily.  - Bisoprolol 2.5mg  daily - EF appears  to be just out of range of ICD and narrow QRS precludes CRT use.   COPD: End-stage, on 3 L home oxygen. Prior smoker. FEV1 22% predicted in 2022 consistent with severe obstruction. Already on triple therapy, recently started ensifentrine.  - Continue follow up with pulmonary.   CAD: Anterior MI in 2010 with DES to LAD and RCA.  Cath in 4/24 showed patent stents and mild nonobstructive CAD.  He has episodes of atypical chest pain likely related to COPD/wheezing  as well as GERD.  - Continue ASA 81 daily.  - Continue atorvastatin 80 mg daily  Follow-up with Dr. Shirlee Latch in 3 months   Clearnce Hasten, MD Advanced Heart Failure Mechanical Circulatory Support 11/29/23

## 2023-11-28 NOTE — Telephone Encounter (Signed)
Pt confirmed appt for 11/29/23

## 2023-11-29 ENCOUNTER — Ambulatory Visit: Payer: Medicare Other | Attending: Cardiology | Admitting: Cardiology

## 2023-11-29 ENCOUNTER — Encounter: Payer: Self-pay | Admitting: Cardiology

## 2023-11-29 VITALS — BP 139/58 | HR 84 | Wt 196.0 lb

## 2023-11-29 DIAGNOSIS — I5022 Chronic systolic (congestive) heart failure: Secondary | ICD-10-CM | POA: Diagnosis not present

## 2023-11-29 DIAGNOSIS — J449 Chronic obstructive pulmonary disease, unspecified: Secondary | ICD-10-CM | POA: Diagnosis not present

## 2023-11-29 DIAGNOSIS — I25118 Atherosclerotic heart disease of native coronary artery with other forms of angina pectoris: Secondary | ICD-10-CM | POA: Diagnosis not present

## 2023-11-29 MED ORDER — SPIRONOLACTONE 25 MG PO TABS: 25.0000 mg | ORAL_TABLET | Freq: Every day | ORAL | 3 refills | Status: DC

## 2023-11-29 NOTE — Patient Instructions (Addendum)
INCREASE YOUR SPIRONOLACTONE TO 25 MG ONCE DAILY  Our provider's schedule isn't open for 2025 yet. We will place you on our recall list and call you for scheduling closer to that time.

## 2023-12-20 ENCOUNTER — Other Ambulatory Visit: Payer: Self-pay | Admitting: Nurse Practitioner

## 2023-12-20 ENCOUNTER — Telehealth: Payer: Self-pay | Admitting: Adult Health

## 2023-12-20 MED ORDER — FAMOTIDINE 20 MG PO TABS: 20.0000 mg | ORAL_TABLET | Freq: Two times a day (BID) | ORAL | 3 refills | Status: DC

## 2023-12-20 NOTE — Telephone Encounter (Signed)
 After clinic call for refill of Pepcid .  Rx sent to pharmacy

## 2024-01-02 ENCOUNTER — Ambulatory Visit
Admission: RE | Admit: 2024-01-02 | Discharge: 2024-01-02 | Disposition: A | Discharge status: Home / Self Care | Payer: Medicare Other | Source: Ambulatory Visit | Attending: Radiation Oncology | Admitting: Radiation Oncology

## 2024-01-02 ENCOUNTER — Encounter: Payer: Self-pay | Admitting: Radiation Oncology

## 2024-01-02 VITALS — BP 133/65 | HR 75 | Temp 97.0°F | Resp 20 | Wt 194.0 lb

## 2024-01-02 DIAGNOSIS — Z923 Personal history of irradiation: Secondary | ICD-10-CM | POA: Diagnosis not present

## 2024-01-02 DIAGNOSIS — Z8501 Personal history of malignant neoplasm of esophagus: Secondary | ICD-10-CM | POA: Insufficient documentation

## 2024-01-02 DIAGNOSIS — C155 Malignant neoplasm of lower third of esophagus: Secondary | ICD-10-CM

## 2024-01-02 DIAGNOSIS — C61 Malignant neoplasm of prostate: Secondary | ICD-10-CM | POA: Diagnosis not present

## 2024-01-02 NOTE — Progress Notes (Signed)
 Radiation Oncology Follow up Note  Name: Leslie Sims [facility]   Date:   01/02/2024 MRN:  [id number] DOB: [birth year]    This 73 y.o. male presents to the clinic today for 56-month follow-up status post concurrent chemoradiation therapy for.  Distal esophageal squamous cell carcinoma.  Patient is also been treated 3 years prior for adenocarcinoma the prostate  REFERRING PROVIDER: Claudene Rayfield HERO, MD  HPI: Patient is a 73 year old male previously treated both to his prostate and distal esophagus.  He had adenocarcinoma the prostate Gleason 7 (3+4).  He also distal esophageal squamous cell carcinoma.  He is doing well he specifically denies dysphagia.  He is having no increased lower urinary tract symptoms at this time.SABRA  His PSA 5 months ago was 0.52 steady or declining over time.  He had upper endoscopy over a year ago showing no evidence of disease.  CT scan of the chest back in August showed similar mild distal esophageal wall thickening without defined mass no evidence of metastatic disease.  He is scheduled for repeat PET CT scan in February  COMPLICATIONS OF TREATMENT: none  FOLLOW UP COMPLIANCE: keeps appointments   PHYSICAL EXAM:  BP 133/65   Pulse 75   Temp (!) 97 F (36.1 C) (Tympanic)   Resp 20   Wt 194 lb (88 kg)   BMI 27.84 kg/m  Well-developed well-nourished patient in NAD. HEENT reveals PERLA, EOMI, discs not visualized.  Oral cavity is clear. No oral mucosal lesions are identified. Neck is clear without evidence of cervical or supraclavicular adenopathy. Lungs are clear to A&P. Cardiac examination is essentially unremarkable with regular rate and rhythm without murmur rub or thrill. Abdomen is benign with no organomegaly or masses noted. Motor sensory and DTR levels are equal and symmetric in the upper and lower extremities. Cranial nerves II through XII are grossly intact. Proprioception is intact. No peripheral adenopathy or edema is identified. No motor or sensory levels are  noted. Crude visual fields are within normal range.  RADIOLOGY RESULTS: CT scans reviewed PET CT scan has been ordered  PLAN: Present time patient is doing well now out 16 months completing concurrent chemoradiation therapy for distal esophageal squamous cell carcinoma.  He has no evidence of disease at this time.  His prostate cancer is under excellent biochemical control.  I have asked to see him back in 1 year for follow-up.  Patient knows to call with any concerns.  He continues close follow-up care with medical oncology.  I would like to take this opportunity to thank you for allowing me to participate in the care of your patient.SABRA [name], MD

## 2024-01-20 ENCOUNTER — Other Ambulatory Visit: Payer: Self-pay | Admitting: Cardiology

## 2024-01-25 ENCOUNTER — Other Ambulatory Visit: Payer: Self-pay | Admitting: *Deleted

## 2024-01-25 DIAGNOSIS — C159 Malignant neoplasm of esophagus, unspecified: Secondary | ICD-10-CM

## 2024-01-26 ENCOUNTER — Inpatient Hospital Stay: Payer: Medicare Other | Attending: Oncology

## 2024-01-26 ENCOUNTER — Other Ambulatory Visit: Payer: Self-pay | Admitting: Pulmonary Disease

## 2024-01-26 ENCOUNTER — Ambulatory Visit
Admission: RE | Admit: 2024-01-26 | Discharge: 2024-01-26 | Disposition: A | Discharge status: Home / Self Care | Payer: Medicare Other | Source: Ambulatory Visit | Attending: Oncology | Admitting: Oncology

## 2024-01-26 DIAGNOSIS — C61 Malignant neoplasm of prostate: Secondary | ICD-10-CM | POA: Insufficient documentation

## 2024-01-26 DIAGNOSIS — C159 Malignant neoplasm of esophagus, unspecified: Secondary | ICD-10-CM | POA: Diagnosis present

## 2024-01-26 DIAGNOSIS — Z8501 Personal history of malignant neoplasm of esophagus: Secondary | ICD-10-CM | POA: Insufficient documentation

## 2024-01-26 DIAGNOSIS — D509 Iron deficiency anemia, unspecified: Secondary | ICD-10-CM | POA: Diagnosis present

## 2024-01-26 LAB — CBC WITH DIFFERENTIAL (CANCER CENTER ONLY)
Abs Immature Granulocytes: 0.06 10*3/uL (ref 0.00–0.07)
Basophils Absolute: 0.1 10*3/uL (ref 0.0–0.1)
Basophils Relative: 1 %
Eosinophils Absolute: 0.2 10*3/uL (ref 0.0–0.5)
Eosinophils Relative: 2 %
HCT: 42.7 % (ref 39.0–52.0)
Hemoglobin: 13 g/dL (ref 13.0–17.0)
Immature Granulocytes: 1 %
Lymphocytes Relative: 5 %
Lymphs Abs: 0.5 10*3/uL — ABNORMAL LOW (ref 0.7–4.0)
MCH: 28.1 pg (ref 26.0–34.0)
MCHC: 30.4 g/dL (ref 30.0–36.0)
MCV: 92.2 fL (ref 80.0–100.0)
Monocytes Absolute: 1 10*3/uL (ref 0.1–1.0)
Monocytes Relative: 11 %
Neutro Abs: 7.4 10*3/uL (ref 1.7–7.7)
Neutrophils Relative %: 80 %
Platelet Count: 286 10*3/uL (ref 150–400)
RBC: 4.63 MIL/uL (ref 4.22–5.81)
RDW: 13.3 % (ref 11.5–15.5)
WBC Count: 9.2 10*3/uL (ref 4.0–10.5)
nRBC: 0 % (ref 0.0–0.2)

## 2024-01-26 LAB — CMP (CANCER CENTER ONLY)
ALT: 27 U/L (ref 0–44)
AST: 27 U/L (ref 15–41)
Albumin: 3.7 g/dL (ref 3.5–5.0)
Alkaline Phosphatase: 71 U/L (ref 38–126)
Anion gap: 11 (ref 5–15)
BUN: 20 mg/dL (ref 8–23)
CO2: 32 mmol/L (ref 22–32)
Calcium: 9.3 mg/dL (ref 8.9–10.3)
Chloride: 101 mmol/L (ref 98–111)
Creatinine: 1.15 mg/dL (ref 0.61–1.24)
GFR, Estimated: >60 mL/min (ref >60)
Glucose, Bld: 126 mg/dL — ABNORMAL HIGH (ref 70–99)
Potassium: 4.6 mmol/L (ref 3.5–5.1)
Sodium: 144 mmol/L (ref 135–145)
Total Bilirubin: 0.5 mg/dL (ref 0.0–1.2)
Total Protein: 7 g/dL (ref 6.5–8.1)

## 2024-01-26 MED ORDER — IOHEXOL 300 MG/ML  SOLN
100.0000 mL | Freq: Once | INTRAMUSCULAR | Status: AC | PRN
  Administered 2024-01-26: 100 mL via INTRAVENOUS

## 2024-01-28 LAB — CEA: CEA: 4 ng/mL (ref 0.0–4.7)

## 2024-02-02 NOTE — Assessment & Plan Note (Deleted)
 S/p concurrent chemotherapy and radiation [finished 07/26/22] Repeat EGD showed no evidence of malignancy. PET restaging- CR CT scan showed no evidence of recurrence.  Recommend continued surveillance CT scan, next in 6 months

## 2024-02-03 ENCOUNTER — Inpatient Hospital Stay: Payer: Medicare Other | Admitting: Oncology

## 2024-02-03 DIAGNOSIS — C159 Malignant neoplasm of esophagus, unspecified: Secondary | ICD-10-CM

## 2024-02-06 ENCOUNTER — Ambulatory Visit: Payer: Medicare Other | Admitting: Radiation Oncology

## 2024-02-13 ENCOUNTER — Telehealth: Payer: Self-pay | Admitting: Oncology

## 2024-02-13 ENCOUNTER — Inpatient Hospital Stay: Payer: Medicare Other | Admitting: Oncology

## 2024-02-13 NOTE — Telephone Encounter (Signed)
 pt called and said he wont make it to his appt today as the paramedics just left his house. he will call back when he can, he just wants to try to get through this week

## 2024-02-14 DIAGNOSIS — C159 Malignant neoplasm of esophagus, unspecified: Secondary | ICD-10-CM

## 2024-02-14 NOTE — Progress Notes (Signed)
 Survivorship Care Plan visit completed.  Treatment summary reviewed and mailed to patient.  ASCO answers booklet reviewed and mailed to patient.  CARE program and Cancer Transitions discussed with patient along with other resources cancer center offers to patients and caregivers.

## 2024-02-16 ENCOUNTER — Ambulatory Visit: Payer: Medicare Other | Admitting: Pulmonary Disease

## 2024-03-06 ENCOUNTER — Other Ambulatory Visit: Payer: Self-pay | Admitting: Pulmonary Disease

## 2024-03-06 ENCOUNTER — Encounter: Payer: Self-pay | Admitting: Oncology

## 2024-03-06 ENCOUNTER — Inpatient Hospital Stay: Attending: Oncology | Admitting: Oncology

## 2024-03-06 ENCOUNTER — Ambulatory Visit: Admitting: Pulmonary Disease

## 2024-03-06 ENCOUNTER — Encounter: Payer: Self-pay | Admitting: Pulmonary Disease

## 2024-03-06 VITALS — BP 120/58 | HR 80 | Temp 97.1°F | Ht 70.0 in | Wt 190.4 lb

## 2024-03-06 VITALS — BP 112/74 | HR 63 | Temp 96.8°F | Resp 18 | Wt 190.3 lb

## 2024-03-06 DIAGNOSIS — I5023 Acute on chronic systolic (congestive) heart failure: Secondary | ICD-10-CM

## 2024-03-06 DIAGNOSIS — C159 Malignant neoplasm of esophagus, unspecified: Secondary | ICD-10-CM | POA: Diagnosis not present

## 2024-03-06 DIAGNOSIS — J449 Chronic obstructive pulmonary disease, unspecified: Secondary | ICD-10-CM

## 2024-03-06 DIAGNOSIS — Z87891 Personal history of nicotine dependence: Secondary | ICD-10-CM | POA: Diagnosis not present

## 2024-03-06 DIAGNOSIS — Z8501 Personal history of malignant neoplasm of esophagus: Secondary | ICD-10-CM | POA: Diagnosis present

## 2024-03-06 DIAGNOSIS — R0789 Other chest pain: Secondary | ICD-10-CM

## 2024-03-06 DIAGNOSIS — Z923 Personal history of irradiation: Secondary | ICD-10-CM | POA: Diagnosis not present

## 2024-03-06 DIAGNOSIS — C61 Malignant neoplasm of prostate: Secondary | ICD-10-CM | POA: Diagnosis not present

## 2024-03-06 DIAGNOSIS — Z9221 Personal history of antineoplastic chemotherapy: Secondary | ICD-10-CM | POA: Diagnosis not present

## 2024-03-06 DIAGNOSIS — J9611 Chronic respiratory failure with hypoxia: Secondary | ICD-10-CM

## 2024-03-06 DIAGNOSIS — R600 Localized edema: Secondary | ICD-10-CM

## 2024-03-06 DIAGNOSIS — R0609 Other forms of dyspnea: Secondary | ICD-10-CM

## 2024-03-06 DIAGNOSIS — Z9981 Dependence on supplemental oxygen: Secondary | ICD-10-CM

## 2024-03-06 MED ORDER — PREDNISONE 20 MG PO TABS: 20.0000 mg | ORAL_TABLET | Freq: Every day | ORAL | 0 refills | Status: AC

## 2024-03-06 MED ORDER — IPRATROPIUM-ALBUTEROL 0.5-2.5 (3) MG/3ML IN SOLN: 3.0000 mL | Freq: Four times a day (QID) | RESPIRATORY_TRACT | 11 refills | Status: AC | PRN

## 2024-03-06 NOTE — Progress Notes (Signed)
 Subjective:    Patient ID: Leslie Sims, male    DOB: Mar 10, 1951, 73 y.o.   MRN: [id number]  Patient Care Team: [name], MD as PCP - General (Family Medicine) Iran Ouch, MD as PCP - Cardiology (Cardiology) Iran Ouch, MD as Consulting Physician (Cardiology) Glory Buff, RN as Registered Nurse Salena Saner, MD as Consulting Physician (Pulmonary Disease) Carmina Miller, MD as Radiation Oncologist (Radiation Oncology) Benita Gutter, RN as Oncology Nurse Navigator Rickard Patience, MD as Consulting Physician (Oncology) Sherlean Foot, MD (Neurology) Rickard Patience, MD as Consulting Physician (Oncology)  Chief Complaint  Patient presents with   Follow-up    Increased DOE for 6-8 weeks. Wheezing. Little dry cough.     BACKGROUND/INTERVAL:  17 M, former smoker quit 02/10/2019 with severe emphysema seen by multiple pulmonologists in past ([place], [place], [place], [place], [place]) followed here for management of COPD. Last seen by  me on 31 May 2023.  He was then seen by Micheline Maze, NP on 10 October 2023.  He has been on Ohtuvayre.  He has also an ischemic cardiomyopathy and prior history of esophageal cancer.  Has chronic respiratory failure with hypoxia on chronic oxygen therapy.  HPI Discussed the use of AI scribe software for clinical note transcription with the patient, who gave verbal consent to proceed.  History of Present Illness   Leslie Sims is a 73 year old male who presents with increased shortness of breath and swelling in the feet.  He has experienced increased shortness of breath and swelling in his feet, which he noticed this morning. In response, he took an extra dose of his diuretic medication. His current medication, Ohtuvayre, was initially effective but now seems less so.  He has a mild cough without significant sputum production and occasional wheezing, which he describes as 'lightly' and not as severe as in the past. He has not  taken prednisone recently.  He mentions experiencing more chest discomfort since his medication regimen was changed from his original prescription.  He is on the borderline of diabetes and has been trying to manage his sugar intake, although he admits to indulging in a donut recently. He is concerned about the cost of his heart medication, which was initially listed as expensive but is covered by his insurance.      DATA/EVENTS: 07/24/12 Office Spirometry: Severe obstruction (FEV1 31% pred) 08/31/13 Office Spirometry: very severe obstruction (FEV1 25% pred) 06/18/15 CT chest: Severe emphysema 03/03/17 Overnight oximetry: Lowest SpO2 72%. 95% of time SpO2 was below 90% 03/03/17 : 384 meters. Desat to 84% 10/04/17 CT chest: Two new irregularly marginated nodules, one in the RUL, and a second in the mid LUL worrisome for either synchronous lung ca or metastatic involvement of the lungs. The two small nodules described on the prior dictated report have resolved in the left superior segment and most likely were postinflammatory. Diffuse centrilobular emphysema. 10/13/17 PET scan: The new left upper lobe nodule is FDG avid consistent with malignancy. Recommend tissue confirmation. The new nodule in the right upper lobe demonstrates low level uptake. While the level of uptake suggests the possibility of an inflammatory or infectious process, a low-grade malignancy is not excluded. Given the suspected malignancy on the left, tissue confirmation should be considered on the right despite the low level of uptake. No distant metastatic disease identified 10/27/17 ENB ([facility]): Transbronchial Fine Needle Aspirations 21G X7, Transbronchial Forceps Biopsy X6. ALVEOLATED LUNG TISSUE WITH HEMOSIDERIN LADEN MACROPHAGES.  NEGATIVE FOR  ATYPIA AND MALIGNANCY 11/01/17 PFTs: FVC: 2.96 > 3.14 L (68 > 72 %pred), FEV1: 0.94 > 0.93 L (27 %pred), FEV1/FVC: 32%, TLC: 9.04 L (137 %pred), DLCO 62 %pred 12/07/17 CTA chest: No  pulmonary embolus identified. Anterior left upper lobe ground-glass and nodular consolidation likely representing pneumonitis. Stable bilateral upper lobe pulmonary nodules. Stable emphysema. 01/21/18 CT chest: new masslike architectural distortion and ground-glass attenuation surrounding the index nodule within the RUL. The appearance favors an inflammatory or infectious process. The index nodule in the left upper lobe is slightly decreased in volume when compared with the previous exam favoring a benign process. Attention on follow-up imaging advise. Advanced changes of emphysema (very severe) 06/01/18 CTA chest: No evidence of pulmonary embolism. Waxing and waning bilateral pulmonary nodular process as described. New 8 mm nodular ill-defined focus of opacification over the lingula which may represent an acute inflammatory or infectious process. No effusion. Emphysema 10/19/18 CTA chest: No acute cardiopulmonary disease and no evidence of pulm embolism. Hospitalization 1/12-1/14/2020: COPD exacerbation Hospitalization 2/22-2/27/20: COPD exacerbation, HCAP 02/11/19 CT chest: Progressed masslike consolidation within the RUL in the region of previous waxing and waning nodule, this is contiguous with the pleural surface and demonstrates spiculated margin and probable small focus of central cavitation. Findings could be secondary to infection superimposed on chronic nodule. However cavitary neoplasm is also a concern. Multiple additional lung nodules which have not significantly changed. Small foci of ground-glass density in the RUL may reflect small focus of infection. Mod-severe emphysema 05/01/19 CT chest: Waxing/waning right lung opacities, suggesting infection, including atypical/mycobacterial infection. Improvement of the prior dominant masslike opacity argues against neoplasm. Stable left upper lobe nodule, likely benign. Aortic Atherosclerosis and Emphysema 11/02/2019 CT chest: Multiple larger pulmonary  nodules and masslike areas seen on the prior study have largely regressed.  Findings indicative of infectious/inflammatory process.  Diffuse bronchial wall thickening with moderate centrilobular and paraseptal emphysema.  Suggestion of left main and three-vessel coronary artery disease. 04/23/2020 CT chest: "Multiple bilateral stable pulmonary nodules. New 7 mm nodule over the posterior left upper lobe. Stable nodularity over an area ofright upper lobe/apical scarring with new 9 mm nodular focus along the most inferior aspect of this scarring. This area has shown to wax and wane on previous exams and is most likely inflammatory". 07/24/2020 CT chest: "Background of marked pulmonary emphysema with stable post infectious or inflammatory changes and with near complete resolution of a presumably infectious or inflammatory nodule in the LEFT upper lobe as compared to previous exam". 01/21/2021 CT chest: Severe emphysematous changes, pulmonary scarring, bilateral calcified granulomas. 01/29/2021 PFTs: Further decline in lung function.  FEV1 0.74 L or 22% predicted, FVC 1.91 L or 42% predicted.  No significant bronchodilator response.  FEV1/FVC 39%.  Patient could not perform lung volumes. Diffusion capacity severely reduced.  This is consistent with very severe COPD. 02/13/2021 2D echo: LVEF 40 to 45% global hypokinesis, severe hypokinesis of the periapical region.  Grade II diastolic dysfunction, dilated left atrium. 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  Biopsies high-grade dysplastic. 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 05/09/2022 PET/CT: Intense tracer uptake associated with the distal esophageal mass. 06/03/2022 chemotherapy-XRT: Patient began concurrent carboplatin/Taxol/XRT. 07/26/2022 completed XRT: Completing chemotherapy. 09/02/2022 PET/CT: Distal esophageal wall thickening with  significant reduction in abnormal FDG activity, small focus of minimally metabolic activity, possibly reflecting posttreatment change.  No evidence of hypermetabolic metastatic disease. 09/09/2022 echocardiogram: LVEF 50 to 55%, grade  1 DD, normal RV function, 10/11/2022 chest x-ray: Hyperinflation of the lungs no acute cardiopulmonary disease.  Granuloma of the LEFT upper lobe. 12/06/2022 PET/CT: No evidence of local recurrence or metastatic disease after treatment for esophageal cancer.  Stable bilateral adrenal adenomas. 01/16/2023 chest x-ray: Changes consistent with COPD with no active disease. 01/26/2023 barium swallow: Moderate esophageal dysmotility, markedly delayed passage of barium tablet at the level of the GE junction passing after approximately 20 minutes. 04/04/2023 right and left heart cath: LVEF 25 to 35%, moderately elevated LV end-diastolic pressure, moderate to severe left ventricular systolic dysfunction, moderate to severely reduced LV systolic function with akinesis of mid to distal anterior and apical myocardium.  Right heart catheterization showed normal RA pressure, mild pulmonary hypertension mild elevated wedge pressure and normal cardiac output.  Patient referred to advanced heart failure clinic. 05/02/2023 echocardiogram: LVEF 35 to 40%, moderate decreased function on the LV, LV with regional wall motion abnormalities.  LV internal cavity dilated.  Grade 1 DD severe hypokinesis of the left ventricular, mid apical anterior wall, anterolateral wall and apical segment.  LA mildly dilated.  No valvular abnormalities.    Review of Systems A 10 point review of systems was performed and it is as noted above otherwise negative.   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   COPD exacerbation (HCC) 07/31/2021   Prostate cancer (HCC) 10/11/2020   Elevated PSA 07/28/2020   Iron deficiency anemia 05/20/2020   Goals of care, counseling/discussion    Palliative care by specialist    Healthcare-associated pneumonia 01/28/2019   COPD with acute exacerbation (HCC) 12/31/2018   Generalized anxiety disorder 11/12/2018   Moderate episode of recurrent major depressive disorder (HCC) 11/12/2018   Chronic respiratory failure with hypoxia (HCC) 09/29/2018   Acute respiratory failure with hypoxia and hypercapnia (HCC) 09/13/2018   Acute on chronic respiratory failure with hypoxia and hypercapnia (HCC) 08/24/2018   Atypical chest pain 07/02/2018   Grief reaction 01/13/2018   COPD with hypoxia (HCC) 12/25/2017   Unstable angina (HCC)    Benign neoplasm of ascending colon    Benign neoplasm of descending colon    Benign neoplasm of sigmoid colon    History of colonic polyps    Pulmonary nodules/lesions, multiple 01/05/2015   Bruit of left carotid artery 11/04/2014   Sleep disorder 07/18/2013   Seasonal and perennial allergic rhinitis 05/29/2013   Bradycardia 04/20/2011   SMOKER 07/30/2010   Coronary artery disease 04/23/2010   Hyperlipidemia 03/24/2010   DEPRESSION/ANXIETY 03/24/2010   Chronic heart failure with preserved ejection fraction (HFpEF) (HCC) 03/24/2010   COPD, very severe (HCC) 03/24/2010   GERD 03/24/2010   Essential hypertension 11/21/2009    Social History   Tobacco Use   Smoking status: Former    Current packs/day: 0.00    Average packs/day: 3.0 packs/day for 42.0 years (126.0 ttl pk-yrs)    Types: Cigarettes    Start date: 02/10/1977    Quit date: 02/10/2019    Years since quitting: 5.1   Smokeless tobacco: Never   Tobacco comments:    25 months ago most smoked 4 packs a day .  Substance Use Topics    Alcohol use: No  Alcohol/week: 0.0 standard drinks of alcohol    Allergies  Allergen Reactions   Prozac [Fluoxetine Hcl] Shortness Of Breath   Hydroxyzine    Bupropion Other (See Comments)    "Makes me feel funny" Other reaction(s): Other (See Comments) Unknown/"made me feel real funny" "Makes me feel funny" "Makes me feel funny" Other reaction(s): Other (See Comments) Unknown/"made me feel real funny" "Makes me feel funny"   Effexor Xr [Venlafaxine Hcl Er] Other (See Comments)    "Makes me feel funny"    Current Meds  Medication Sig   ALPRAZolam (XANAX) 0.5 MG tablet Take 0.5 mg by mouth 3 (three) times daily.   aspirin 81 MG tablet Take 81 mg by mouth daily.   atorvastatin (LIPITOR) 80 MG tablet Take 1 tablet (80 mg total) by mouth daily.   bisoprolol (ZEBETA) 5 MG tablet TAKE 1/2 TABLET BY MOUTH DAILY   budesonide (PULMICORT) 0.5 MG/2ML nebulizer solution Take 2 mLs (0.5 mg total) by nebulization in the morning and at bedtime.   Ensifentrine (OHTUVAYRE) 3 MG/2.5ML SUSP Inhale 3 mLs into the lungs 2 (two) times daily. DX:J44.9   ENTRESTO 24-26 MG TAKE 1 TABLET BY MOUTH TWICE A DAY   famotidine (PEPCID) 20 MG tablet Take 1 tablet (20 mg total) by mouth 2 (two) times daily. for breakthrough reflux   FARXIGA 10 MG TABS tablet TAKE 1 TABLET BY MOUTH DAILY BEFORE BREAKFAST.   FLUoxetine (PROZAC) 20 MG tablet Take 20 mg by mouth daily.   fluticasone (FLONASE) 50 MCG/ACT nasal spray Place 2 sprays into both nostrils daily.   furosemide (LASIX) 20 MG tablet Take 1 tablet (20 mg total) by mouth as needed.   ketoconazole (NIZORAL) 2 % cream Apply topically 2 (two) times daily.   loratadine (CLARITIN) 10 MG tablet Take 10 mg by mouth daily.   mirtazapine (REMERON) 45 MG tablet Take 45 mg by mouth at bedtime.   Multiple Vitamin (MULTIVITAMIN) capsule Take 1 capsule by mouth daily.   nitroGLYCERIN (NITROSTAT) 0.4 MG SL tablet Place 1 tablet (0.4 mg total) under the tongue every 5 (five)  minutes as needed.   OXYGEN Inhale 3 L into the lungs daily.   pantoprazole (PROTONIX) 40 MG tablet Take 40 mg by mouth 2 (two) times daily before a meal.   [EXPIRED] predniSONE (DELTASONE) 20 MG tablet Take 1 tablet (20 mg total) by mouth daily with breakfast for 5 days.   Spacer/Aero-Holding Chambers (AEROCHAMBER MV) inhaler Use as instructed   spironolactone (ALDACTONE) 25 MG tablet Take 1 tablet (25 mg total) by mouth daily.   sucralfate (CARAFATE) 1 g tablet Take 1 tablet (1 g total) by mouth 3 (three) times daily with meals. 1 hour before meals and medications   [DISCONTINUED] albuterol (VENTOLIN HFA) 108 (90 Base) MCG/ACT inhaler TAKE 2 PUFFS BY MOUTH EVERY 6 HOURS AS NEEDED FOR WHEEZE OR SHORTNESS OF BREATH   [DISCONTINUED] ipratropium-albuterol (DUONEB) 0.5-2.5 (3) MG/3ML SOLN Take 3 mLs by nebulization every 6 (six) hours as needed.    Immunization History  Administered Date(s) Administered   Fluad Quad(high Dose 65+) 11/16/2021, 11/05/2022   Influenza Whole 09/19/2012   Influenza, High Dose Seasonal PF 09/28/2019, 10/21/2023   Influenza,inj,Quad PF,6+ Mos 10/24/2013, 11/25/2014, 03/02/2016, 10/27/2016, 10/05/2017   Influenza-Unspecified 10/24/2013, 11/25/2014, 03/02/2016, 10/27/2016, 10/05/2017, 09/18/2018, 09/28/2019, 09/30/2020   Moderna Covid-19 Fall Seasonal Vaccine 7yrs & older 11/05/2022, 03/29/2023, 10/21/2023   Moderna Covid-19 Seasonal Vaccine 6 months thru 71years of age 27/17/2023   Moderna Sars-Covid-2 Vaccination 01/26/2020, 02/23/2020  PFIZER Comirnaty(Gray Top)Covid-19 Tri-Sucrose Vaccine 12/15/2020, 07/15/2021   PFIZER(Purple Top)SARS-COV-2 Vaccination 01/26/2020, 02/23/2020   PNEUMOCOCCAL CONJUGATE-20 03/22/2022   Pfizer Covid-19 Vaccine Bivalent Booster 8yrs & up 01/14/2022, 05/25/2022   Pneumococcal Conjugate-13 11/25/2014   Pneumococcal Polysaccharide-23 08/24/2016, 10/27/2016   Pneumococcal-Unspecified 12/20/2008   Rsv, Bivalent, Protein Subunit  Rsvpref,pf Verdis Frederickson) 02/20/2023   Tdap 12/20/2008        Objective:     BP (!) 120/58 (BP Location: Right Arm, Cuff Size: Normal)   Pulse 80   Temp (!) 97.1 F (36.2 C)   Ht 5\' 10"  (1.778 m)   Wt 190 lb 6.4 oz (86.4 kg)   SpO2 96%   BMI 27.32 kg/m   SpO2: 96 % O2 Device: Nasal cannula O2 Flow Rate (L/min): 3 L/min O2 Type: Pulse O2  GENERAL: Well-developed well-nourished gentleman, chronic use of accessories, no conversational dyspnea. No acute distress.  Presents in transport chair due to dyspnea. Using oxygen via portable concentrator at 4 L pulsed. HEAD: Normocephalic, atraumatic. EYES: Pupils equal, round, reactive to light.  No scleral icterus. MOUTH: Wears dentures. NECK: Supple. No thyromegaly. No nodules. No JVD.  Trachea midline, no crepitus. PULMONARY: Very distant breath sounds, no adventitious sounds. CARDIOVASCULAR: S1 and S2. Regular rate and rhythm.  No rubs murmurs or gallops heard. GASTROINTESTINAL: Protuberant abdomen, benign. MUSCULOSKELETAL: No joint deformity, no clubbing, 2+ pitting edema feet/ankles. NEUROLOGIC: Awake, alert, oriented.  No overt focal deficits.  Speech is fluent. SKIN: Intact,warm,dry.  Limited exam: No rashes. PSYCH: Normal mood and behavior.      Assessment & Plan:     ICD-10-CM   1. Stage 4 very severe COPD by GOLD classification (HCC)  J44.9     2. Chronic respiratory failure with hypoxia (HCC)  J96.11     3. Acute on chronic systolic heart failure (HCC)  Z61.09 ECHOCARDIOGRAM COMPLETE    4. Dyspnea on exertion  R06.09 ECHOCARDIOGRAM COMPLETE    5. Primary esophageal squamous cell carcinoma (HCC)  C15.9       Orders Placed This Encounter  Procedures   ECHOCARDIOGRAM COMPLETE    Standing Status:   Future    Expected Date:   03/13/2024    Expiration Date:   03/06/2025    Where should this test be performed:   [facility]    Perflutren DEFINITY (image enhancing agent) should be administered unless  hypersensitivity or allergy exist:   Administer Perflutren    Reason for exam-Echo:   Congestive Heart Failure  I50.9    Reason for exam-Echo:   Dyspnea  R06.00    Meds ordered this encounter  Medications   ipratropium-albuterol (DUONEB) 0.5-2.5 (3) MG/3ML SOLN    Sig: Take 3 mLs by nebulization every 6 (six) hours as needed.    Dispense:  360 mL    Refill:  11    Dx. is COPD J 44.9   predniSONE (DELTASONE) 20 MG tablet    Sig: Take 1 tablet (20 mg total) by mouth daily with breakfast for 5 days.    Dispense:  5 tablet    Refill:  0   Discussion:    Dyspnea Increased shortness of breath and mild wheezing, potentially related to weather changes. Utilized extra diuretics for peripheral edema, but current regimen appears less effective. No infectious etiology suspected. - Prescribe a 5-day course of prednisone for wheezing - Order an echocardiogram to assess cardiac function - Send prescription for albuterol nebulizer solution  Peripheral edema Mild swelling in feet, managed with extra  diuretics.  He is maximized on COPD management, including Ohtuvayre, i do not believe pulmonary in etiology. Echocardiogram needed to evaluate cardiac function. - Order an echocardiogram to assess cardiac function  Chest discomfort Increased chest discomfort since cardiac medication change, persistent in chest area. Echocardiogram will assess cardiac involvement. - Order an echocardiogram to assess cardiac function - Needs to follow-up with cardiology  Medication management On multiple medications, including heart medications, with cost and management concerns. One heart medication confirmed covered by insurance at no cost. Adjustments may be necessary based on echocardiogram results. - Review and manage current medication regimen - Ensure prescriptions are sent to the pharmacy  Diabetes risk Borderline diabetes, managing sugar intake. Indulged in a donut, indicating dietary management  challenges. - Provide dietary counseling to manage sugar intake  Follow-up Scheduled for follow-up in two months to reassess treatment plan and address all issues. - Schedule follow-up appointment in two months      Advised if symptoms do not improve or worsen, to please contact office for sooner follow up or seek emergency care.    I spent 42 minutes of dedicated to the care of this patient on the date of this encounter to include pre-visit review of records, face-to-face time with the patient discussing conditions above, post visit ordering of testing, clinical documentation with the electronic health record, making appropriate referrals as documented, and communicating necessary findings to members of the patients care team.     C. Danice Goltz, MD Advanced Bronchoscopy PCCM [facility]    *This note was generated using voice recognition software/Dragon and/or AI transcription program.  Despite best efforts to proofread, errors can occur which can change the meaning. Any transcriptional errors that result from this process are unintentional and may not be fully corrected at the time of dictation.

## 2024-03-06 NOTE — Assessment & Plan Note (Addendum)
 He is on concurrent chemotherapy carboplatin/Taxol weekly with radiation. S/p concurrent chemotherapy and radiation [finished 07/26/22] Repeat EGD showed no evidence of malignancy. Dec 2023 PET showed CR  CT scan showed no evidence of recurrence.  Recommend continued surveillance CT scan, next in 6 months  Very high risk for additional endoscopic surveillance

## 2024-03-06 NOTE — Patient Instructions (Signed)
 VISIT SUMMARY:  Today, we discussed your increased shortness of breath, swelling in your feet, mild cough, occasional wheezing, and chest discomfort. We also reviewed your medication regimen and concerns about diabetes risk.  YOUR PLAN:  -DYSPNEA: Dyspnea means shortness of breath. We will start a 5-day course of prednisone to help with your wheezing and have prescribed an albuterol nebulizer solution. An echocardiogram will be done to check your heart function.  -PERIPHERAL EDEMA: Peripheral edema is swelling in the feet or legs. We will perform an echocardiogram to evaluate your heart function and determine the effectiveness of your current medication, Otovera.  -CHEST DISCOMFORT: Chest discomfort can be a sign of heart issues. We will perform an echocardiogram to assess your heart function and understand the cause of your discomfort.  -MEDICATION MANAGEMENT: We reviewed your current medications, including your heart medication, which is covered by your insurance. We will manage and adjust your medications as needed based on the results of your echocardiogram.  -DIABETES RISK: You are at risk for diabetes and need to manage your sugar intake. We will provide dietary counseling to help you manage your sugar levels.  INSTRUCTIONS:  Please schedule a follow-up appointment in two months to reassess your treatment plan and address all issues. An echocardiogram has been ordered to evaluate your heart function. Ensure you take the prescribed prednisone for 5 days and use the albuterol nebulizer solution as directed.

## 2024-03-06 NOTE — Assessment & Plan Note (Signed)
 Gleason score 3+4, status post definitive radiation. Has not followed up with urology Dr. Richardo Hanks.   PSA is stable. Today's level is pending.

## 2024-03-06 NOTE — Progress Notes (Signed)
 Hematology/Oncology Progress note [phone] [phone]     Clinic Day:  03/06/2024   Referring physician: [name], MD  Assessment & Plan:  Cancer Staging  Primary esophageal squamous cell carcinoma (HCC) Staging form: Esophagus - Squamous Cell Carcinoma, AJCC 8th Edition - Clinical: Stage II (cT3, cN0, cM0, L: Lower) - Signed by Rickard Patience, MD on 05/31/2022  Prostate cancer [facility]) Staging form: Prostate, AJCC 8th Edition - Clinical: cT2c, cN0, cM0, Grade Group: 2 - Signed by Rickard Patience, MD on 05/04/2022   Primary esophageal squamous cell carcinoma (HCC) He is on concurrent chemotherapy carboplatin/Taxol weekly with radiation. S/p concurrent chemotherapy and radiation [finished 07/26/22] Repeat EGD showed no evidence of malignancy. Dec 2023 PET showed CR  CT scan showed no evidence of recurrence.  Recommend continued surveillance CT scan, next in 6 months  Very high risk for additional endoscopic surveillance  Prostate cancer (HCC) Gleason score 3+4, status post definitive radiation. Has not followed up with urology Dr. Richardo Hanks.   PSA is stable. Today's level is pending.   COPD with hypoxia (HCC) He is on nasal cannula oxygen.     Orders Placed This Encounter  Procedures   CT CHEST ABDOMEN PELVIS W CONTRAST    Standing Status:   Future    Expected Date:   09/06/2024    Expiration Date:   03/06/2025    If indicated for the ordered procedure, I authorize the administration of contrast media per Radiology protocol:   Yes    Does the patient have a contrast media/X-ray dye allergy?:   No    Preferred imaging location?:   [facility]    If indicated for the ordered procedure, I authorize the administration of oral contrast media per Radiology protocol:   Yes   CBC with Differential (Cancer Center Only)    Standing Status:   Future    Expected Date:   09/06/2024    Expiration Date:   03/06/2025   CMP (Cancer Center only)    Standing Status:    Future    Expected Date:   09/06/2024    Expiration Date:   03/06/2025   PSA    Standing Status:   Future    Expected Date:   09/06/2024    Expiration Date:   03/06/2025   CEA    Standing Status:   Future    Expected Date:   09/06/2024    Expiration Date:   03/06/2025    Follow up 6 months.  The patient understands the plans discussed today and is in agreement with them.  He knows to contact our office if he develops concerns prior to his next appointment.   Rickard Patience, MD      Chief complaints: follow up for esophageal squamous cell carcinoma  History of Present Illness  Oncology History  Prostate cancer (HCC)  10/11/2020 Initial Diagnosis   Prostate cancer (HCC)   05/04/2022 Cancer Staging   Staging form: Prostate, AJCC 8th Edition - Clinical: cT2c, cN0, cM0, Grade Group: 2 - Signed by Rickard Patience, MD on 05/04/2022 Gleason score: 7 Histologic grading system: 5 grade system   07/22/2023 Imaging   CT chest abdomen pelvis w contrast showed 1. Similar mild distal esophageal wall thickening, without well-defined mass. No evidence of metastatic disease. 2. Esophageal air fluid level suggests dysmotility or gastroesophageal reflux. 3. Bilateral adrenal nodules, similar back to 2019 and presumably adenomas. No specific follow-up indicated. 4. Mild common duct dilatation which is unchanged back to at  least 2019 and most likely within normal variation. This could be correlated with bilirubin levels. 5. Incidental findings, including: Aortic atherosclerosis (ICD10-I70.0), coronary artery atherosclerosis and emphysema (ICD10-J43.9).   Primary esophageal squamous cell carcinoma (HCC)  04/19/2022 Procedure   04/19/2022 EGD by Dr. Mia Creek showed partially obstructive likely malignant esophageal tumor was found at GE junction.  Biopsied.  Normal stomach and normal examined duodenum. Biopsy pathology showed fragments of the squamous mucosa with high-grade dysplastic.  Scant gastric mucosa present with  reactive changes and no dysplastic or significant intestinal metaplastic.  Fragments of fibrillar purulent ulcer diabetes also present.  04/27/2022, repeat EGD and a biopsy of GE junction partially obstructed ulcerating mass. Repeat biopsy pathology showed superficial and disaggregated fragments of squamous mucosa with at least severe dysplastic/squamous cell carcinoma in situ.  Definite invasion cannot be identified.  In part secondary to specimen fragmentation and tangential sectioning.    04/27/2022 Imaging   MRI abdomen MRCP with and without contrast showed bilateral benign adrenal adenoma.  Small hepatic hemangioma and small right hepatic parenchymal vascular anomalies. Small hiatal hernia.  Colonic diverticulosis.   05/04/2022 Initial Diagnosis   Primary esophageal squamous cell carcinoma  + Stomach discomfort for 4 months, dysphagia for both solids and liquids at times. + Weight loss   05/04/2022 Cancer Staging   Staging form: Esophagus - Squamous Cell Carcinoma, AJCC 8th Edition - Clinical: Stage II (cT3, cN0, cM0, L: Lower) - Signed by Rickard Patience, MD on 05/31/2022 Stage prefix: Initial diagnosis Total positive nodes: 0   05/09/2022 Imaging   PET scan showed intense tracer uptake associated with the distal esophageal mass just above the level of the GE junction compatible with primary esophageal neoplasm. No signs of tracer avid nodal metastasis or solid organ metastasis.   05/20/2022 Procedure   EUS by Dr.Spaete.  T3,Nx 05/20/2022, esophagus mass biopsy showed invasive moderately differentiated squamous cell carcinoma, with basaloid features and minimal keratinization.  Background squamous cell carcinoma in situ.   06/03/2022 -  Chemotherapy   Patient is on Treatment Plan :  ESOPHAGUS Carboplatin/PACLitaxel weekly x 6 weeks with XRT       07/09/2022 - 07/12/2022 Hospital Admission   Hospitalization due to COPD exacerbation.     09/03/2022 Imaging   PET restaging  1. Similar symmetric  distal esophageal wall thickening with significant reduction in the abnormal FDG avidity, now with a small focus of minimally metabolic activity, nonspecific and possibly reflecting posttreatment change or residual tumor. Suggest continued attention on follow-up imaging. 2. No evidence of hypermetabolic metastatic disease in the chest, abdomen or pelvis. 3. Aortic Atherosclerosis (ICD10-I70.0) and Emphysema (ICD10-J43.9).   11/08/2022 Procedure   Repeat EGD by Dr. Mia Creek showed  -Non-severe radiation esophagitis with no bleeding. Biopsied.-Pathology showed mild acute esophagitis.  No evidence of Barrett mucosa or dysplasia, no evidence of atypia or malignancy. - Small hiatal hernia. - Normal stomach. - Normal examined duodenum     12/08/2022 Imaging   Repeat PET scan showed interval resolution of residual hypermetabolism within the distal esophagus consistent with response to therapy.  No evidence of local recurrence or metastatic disease.  Stable bilateral adrenal adenomas.  Aortic atherosclerosis.   03/11/2023 Imaging   CT chest abdomen pelvis wo contrast  1. Stable mild to moderate distal esophageal wall thickening without discrete mass. No paraesophageal lymphadenopathy. 2. No findings for metastatic disease involving the chest, abdomen or pelvis. 3. Stable bilateral adrenal gland adenomas.   Aortic Atherosclerosis (ICD10-I70.0) and Emphysema (ICD10-J43.9).  Patient has multiple comorbidities including COPD/chronic respiratory failure, history of STEMI/combined systolic and diastolic CHF [last LVEF 40 to 66%], lung nodules previously avid on PET scan, biopsy negative and was felt to be calcified granulomas.-Pulmonology Dr. [name] Patient lives by himself.  Independent with ADL and some of the iADL  Interval History R[place] is here today for repeat clinical assessment. He denies fevers or chills. He denies pain. His appetite is fair His weight is stable. He reports feeling  well. No new complaints.  + SOB, stable. Not worse.    Review of Systems  Constitutional:  Negative for appetite change, chills, fatigue, fever and unexpected weight change.  HENT:   Negative for hearing loss and voice change.   Eyes:  Negative for eye problems and icterus.  Respiratory:  Positive for shortness of breath. Negative for chest tightness and cough.        Chronic respiratory failure on oxygen  Cardiovascular:  Negative for leg swelling.  Gastrointestinal:  Negative for abdominal distention, abdominal pain and constipation.  Endocrine: Negative for hot flashes.  Genitourinary:  Negative for difficulty urinating, dysuria and frequency.   Musculoskeletal:  Negative for arthralgias.  Skin:  Negative for itching and rash.  Neurological:  Negative for light-headedness and numbness.  Hematological:  Negative for adenopathy. Does not bruise/bleed easily.  Psychiatric/Behavioral:  Negative for confusion.      VITALS:  Blood pressure 112/74, pulse 63, temperature (!) 96.8 F (36 C), temperature source Tympanic, resp. rate 18, weight 190 lb 4.8 oz (86.3 kg), SpO2 95%, peak flow (!) 3 L/min.  Wt Readings from Last 3 Encounters:  03/06/24 190 lb 6.4 oz (86.4 kg)  03/06/24 190 lb 4.8 oz (86.3 kg)  01/02/24 194 lb (88 kg)    Body mass index is 27.31 kg/m.  Performance status (ECOG): 2 - Symptomatic, <50% confined to bed  PHYSICAL EXAM:  Physical Exam Constitutional:      General: He is not in acute distress.    Appearance: He is obese. He is not diaphoretic.  HENT:     Head: Normocephalic and atraumatic.  Eyes:     General: No scleral icterus. Cardiovascular:     Rate and Rhythm: Normal rate.     Heart sounds: No murmur heard. Pulmonary:     Effort: Pulmonary effort is normal. No respiratory distress.     Comments: Patient is on nasal cannula oxygen Abdominal:     General: There is no distension.     Palpations: Abdomen is soft.  Musculoskeletal:        General:  Normal range of motion.     Cervical back: Normal range of motion.  Skin:    Findings: No erythema.  Neurological:     Mental Status: He is alert and oriented to person, place, and time. Mental status is at baseline.     Motor: No abnormal muscle tone.  Psychiatric:        Mood and Affect: Mood and affect normal.      LABS:      Latest Ref Rng & Units 01/26/2024   10:14 AM 11/01/2023    9:39 AM 07/22/2023    9:59 AM  CBC  WBC 4.0 - 10.5 K/uL 9.2  6.6  6.1   Hemoglobin 13.0 - 17.0 g/dL 44.0  34.7  42.5   Hematocrit 39.0 - 52.0 % 42.7  42.6  41.6   Platelets 150 - 400 K/uL 286  229  263       Latest  Ref Rng & Units 01/26/2024   10:14 AM 11/01/2023    9:39 AM 08/10/2023   12:20 PM  CMP  Glucose 70 - 99 mg/dL 161  096  045   BUN 8 - 23 mg/dL 20  14  15    Creatinine 0.61 - 1.24 mg/dL 4.09  8.11  9.14   Sodium 135 - 145 mmol/L 144  141  141   Potassium 3.5 - 5.1 mmol/L 4.6  3.9  4.4   Chloride 98 - 111 mmol/L 101  104  101   CO2 22 - 32 mmol/L 32  29  25   Calcium 8.9 - 10.3 mg/dL 9.3  9.1  8.9   Total Protein 6.5 - 8.1 g/dL 7.0     Total Bilirubin 0.0 - 1.2 mg/dL 0.5     Alkaline Phos 38 - 126 U/L 71     AST 15 - 41 U/L 27     ALT 0 - 44 U/L 27        STUDIES:  CT CHEST ABDOMEN PELVIS W CONTRAST Result Date: 02/03/2024 CLINICAL DATA:  Esophageal cancer. History of prostate cancer. * Tracking Code: BO * EXAM: CT CHEST, ABDOMEN, AND PELVIS WITH CONTRAST TECHNIQUE: Multidetector CT imaging of the chest, abdomen and pelvis was performed following the standard protocol during bolus administration of intravenous contrast. RADIATION DOSE REDUCTION: This exam was performed according to the departmental dose-optimization program which includes automated exposure control, adjustment of the mA and/or kV according to patient size and/or use of iterative reconstruction technique. CONTRAST:  OMNIPAQUE IOHEXOL 300 MG/ML  SOLN COMPARISON:  07/22/2023. FINDINGS: CT CHEST FINDINGS  Cardiovascular: Atherosclerotic calcification of the aorta and all 3 coronary arteries. Heart size normal. No pericardial effusion. Mediastinum/Nodes: No pathologically enlarged mediastinal, hilar or axillary lymph nodes. Slight distal esophageal wall thickening, similar to 07/22/2023. Lungs/Pleura: Centrilobular emphysema. Slightly nodular scarring in the right upper lobe (4/44), unchanged. Partially calcified 8 mm nodule in the posterior left upper lobe (4/49) and 3 mm nodule in the superior segment left lower lobe (4/67), unchanged. Scarring in the lingula and both lower lobes. No new pulmonary nodules. No pleural fluid. Airway is unremarkable. Musculoskeletal: Degenerative changes in the spine. No worrisome lytic or sclerotic lesions. CT ABDOMEN PELVIS FINDINGS Hepatobiliary: Faint blush of hyperattenuation in the inferior right hepatic lobe (2/75), likely a perfusion anomaly. There may be a small exophytic cyst off the medial right hepatic lobe (2/69). No specific follow-up necessary. Liver and gallbladder are otherwise unremarkable. Pancreas: Negative. Spleen: Negative. Adrenals/Urinary Tract: Low-attenuation thickening of the right adrenal gland, unchanged. Left adrenal nodule measures 2.7 cm and 15 Hounsfield units. No specific follow-up necessary. Kidneys are unremarkable. Ureters are decompressed. Bladder is grossly unremarkable. Stomach/Bowel: Duodenal diverticulum. Stomach, small bowel, appendix and colon are unremarkable. Vascular/Lymphatic: Atherosclerotic calcification of the aorta. No pathologically enlarged lymph nodes. Reproductive: Prostate is visualized. Penile prosthesis. Deflated reservoir anterior to the bladder. Other: Small bilateral inguinal hernias contain fat. No free fluid. Mesenteries and peritoneum are unremarkable. Musculoskeletal: Degenerative changes in the spine. No worrisome lytic or sclerotic lesions. IMPRESSION: 1. Minimal residual distal esophageal wall thickening, stable. No  evidence of metastatic disease. 2. Small left lung nodules, stable. Recommend continued attention on follow-up. 3. Right adrenal thickening specially left adrenal adenoma. 4. Aortic atherosclerosis (ICD10-I70.0). Three-vessel coronary artery calcification. 5.  Emphysema (ICD10-J43.9). Electronically Signed   By: Leanna Battles M.D.   On: 02/03/2024 09:11      Past Medical History:  Diagnosis Date  Allergic rhinitis    Anxiety    Back pain    Cancer (HCC)    prostate   CHF (congestive heart failure) (HCC)    Colon polyps 08/2012   colonoscopy; multiple colon polyps; repeat colonoscopy in one year.   Complication of anesthesia    COPD (chronic obstructive pulmonary disease) (HCC)    a. on home O2.   Coronary artery disease 11/2009   a. late presenting ant MI; b. LHC 100% pLAD s/p PCI/DES, 99% mRCA s/p PCI/DES, EF 35%; c. nuclear stress test 05/13: prior ant/inf infarcts w/o ischemia, EF 43%; d. LHC 02/14: widely patent stents with no other obs dz, EF 40%; e. LHC 11/17: LM nl, mLAD 10%, patent LAD stent, dLAD 20%, p-mRCA 10%, mRCA 40%, patent RCA stent, EF 35-45%    Depression    Diabetes (HCC)    Dysphagia    Family history of adverse reaction to anesthesia    GERD (gastroesophageal reflux disease)    Headache(784.0)    Helicobacter pylori (H. pylori)    Hemorrhoids    Hernia    inguinal   HFrEF (heart failure with reduced ejection fraction) (HCC)    a. 10/2016 LV gram: EF 35-45%; b. 06/2018 Echo: EF 40-45%. c. 01/2021 Echo: EF 40-45%   Hyperlipidemia    Hypertension    Insomnia    Ischemic cardiomyopathy    a. 10/2016 LV gram: EF 35-45%; b. 06/2018 Echo: EF 40-45%.   Myalgia    Oxygen dependent    Peptic ulcer    Pulmonary nodules    ST elevation (STEMI) myocardial infarction involving left anterior descending coronary artery (HCC) 11/2009   a. s/p PCI to the LAD   Status post dilation of esophageal narrowing 2000   Thickening of esophagus    Tinea pedis    Wears dentures     partial upper    Past Surgical History:  Procedure Laterality Date    coronary stents     Admission  12/20/2012   COPD exacerbation.  [facility].   CARDIAC CATHETERIZATION     CARDIAC CATHETERIZATION  02/05/2013   [facility]   CARDIAC CATHETERIZATION  09/19/2013   [facility] : patent stents with no change in anatomy. EF: 40$   CARDIAC CATHETERIZATION Left 11/12/2016   Procedure: Left Heart Cath and Coronary Angiography;  Surgeon: Iran Ouch, MD;  Location: [facility] INVASIVE CV LAB;  Service: Cardiovascular;  Laterality: Left;   COLONOSCOPY     COLONOSCOPY WITH PROPOFOL N/A 05/18/2016   Procedure: COLONOSCOPY WITH PROPOFOL;  Surgeon: Midge Minium, MD;  Location: [facility] ENDOSCOPY;  Service: Endoscopy;  Laterality: N/A;   COLONOSCOPY WITH PROPOFOL N/A 03/19/2020   Procedure: COLONOSCOPY WITH PROPOFOL;  Surgeon: Toney Reil, MD;  Location: [facility] ENDOSCOPY;  Service: Gastroenterology;  Laterality: N/A;   CORONARY ANGIOPLASTY WITH STENT PLACEMENT  09/19/2009   LAD 3.0 X23 mm Xience DES, RCA: 4.0 X 15 mm Xience DES   ELECTROMAGNETIC NAVIGATION BROCHOSCOPY N/A 10/27/2017   Procedure: ELECTROMAGNETIC NAVIGATION BRONCHOSCOPY;  Surgeon: Erin Fulling, MD;  Location: [facility] ORS;  Service: Cardiopulmonary;  Laterality: N/A;   ESOPHAGEAL DILATION     ESOPHAGOGASTRODUODENOSCOPY  12/20/2006   ESOPHAGOGASTRODUODENOSCOPY  08/20/2012   ESOPHAGOGASTRODUODENOSCOPY (EGD) WITH PROPOFOL N/A 04/19/2022   Procedure: ESOPHAGOGASTRODUODENOSCOPY (EGD) WITH PROPOFOL;  Surgeon: Regis Bill, MD;  Location: [facility] ENDOSCOPY;  Service: Endoscopy;  Laterality: N/A;   ESOPHAGOGASTRODUODENOSCOPY (EGD) WITH PROPOFOL N/A 04/27/2022   Procedure: ESOPHAGOGASTRODUODENOSCOPY (EGD) WITH PROPOFOL;  Surgeon: Regis Bill, MD;  Location: [facility]  ENDOSCOPY;  Service: Endoscopy;  Laterality: N/A;   ESOPHAGOGASTRODUODENOSCOPY (EGD) WITH PROPOFOL N/A 11/08/2022   Procedure: ESOPHAGOGASTRODUODENOSCOPY (EGD) WITH PROPOFOL;  Surgeon:  Regis Bill, MD;  Location: [facility] ENDOSCOPY;  Service: Endoscopy;  Laterality: N/A;   EUS N/A 05/20/2022   Procedure: UPPER ENDOSCOPIC ULTRASOUND (EUS) LINEAR;  Surgeon: Doren Custard, MD;  Location: [facility] ENDOSCOPY;  Service: Gastroenterology;  Laterality: N/A;  [facility]   HERNIA REPAIR  07/20/2012   L inguinal hernia repair   PENILE PROSTHESIS IMPLANT     RIGHT/LEFT HEART CATH AND CORONARY ANGIOGRAPHY Bilateral 04/04/2023   Procedure: RIGHT/LEFT HEART CATH AND CORONARY ANGIOGRAPHY;  Surgeon: Iran Ouch, MD;  Location: [facility] INVASIVE CV LAB;  Service: Cardiovascular;  Laterality: Bilateral;    Family History  Problem Relation Age of Onset   Heart attack Brother        Brother #1   Diabetes Brother    Hypertension Brother        #3   Coronary artery disease Father 55       deceased   Heart attack Father    Diabetes Father    Heart disease Father    COPD Mother 51       deceased   Alcohol abuse Sister        polysubstance abuse   COPD Sister    Lung cancer Sister    Alcohol abuse Sister        polysubstance abuse   Penile cancer Brother    Diabetes Brother    Prostate cancer Neg Hx    Bladder Cancer Neg Hx    Kidney cancer Neg Hx     Social History:  reports that he quit smoking about 5 years ago. His smoking use included cigarettes. He started smoking about 47 years ago. He has a 126 pack-year smoking history. He has never used smokeless tobacco. He reports that he does not drink alcohol and does not use drugs.  Allergies:  Allergies  Allergen Reactions   Prozac [Fluoxetine Hcl] Shortness Of Breath   Hydroxyzine    Bupropion Other (See Comments)    "Makes me feel funny" Other reaction(s): Other (See Comments) Unknown/"made me feel real funny" "Makes me feel funny" "Makes me feel funny" Other reaction(s): Other (See Comments) Unknown/"made me feel real funny" "Makes me feel funny"   Effexor Xr [Venlafaxine Hcl Er] Other (See Comments)    "Makes me feel  funny"    Current Medications: Current Outpatient Medications  Medication Sig Dispense Refill   ALPRAZolam (XANAX) 0.5 MG tablet Take 0.5 mg by mouth 3 (three) times daily.     aspirin 81 MG tablet Take 81 mg by mouth daily.     atorvastatin (LIPITOR) 80 MG tablet Take 1 tablet (80 mg total) by mouth daily. 90 tablet 3   bisoprolol (ZEBETA) 5 MG tablet TAKE 1/2 TABLET BY MOUTH DAILY 45 tablet 2   budesonide (PULMICORT) 0.5 MG/2ML nebulizer solution Take 2 mLs (0.5 mg total) by nebulization in the morning and at bedtime. 360 mL 10   Ensifentrine (OHTUVAYRE) 3 MG/2.5ML SUSP Inhale 3 mLs into the lungs 2 (two) times daily. DX:J44.9 360 mL 0   ENTRESTO 24-26 MG TAKE 1 TABLET BY MOUTH TWICE A DAY 60 tablet 6   famotidine (PEPCID) 20 MG tablet Take 1 tablet (20 mg total) by mouth 2 (two) times daily. for breakthrough reflux 60 tablet 3   FARXIGA 10 MG TABS tablet TAKE 1 TABLET BY MOUTH DAILY BEFORE BREAKFAST.  30 tablet 6   FLUoxetine (PROZAC) 20 MG tablet Take 20 mg by mouth daily.     fluticasone (FLONASE) 50 MCG/ACT nasal spray Place 2 sprays into both nostrils daily.     furosemide (LASIX) 20 MG tablet Take 1 tablet (20 mg total) by mouth as needed. 30 tablet 1   ketoconazole (NIZORAL) 2 % cream Apply topically 2 (two) times daily.     loratadine (CLARITIN) 10 MG tablet Take 10 mg by mouth daily.     mirtazapine (REMERON) 45 MG tablet Take 45 mg by mouth at bedtime.     Multiple Vitamin (MULTIVITAMIN) capsule Take 1 capsule by mouth daily.     nitroGLYCERIN (NITROSTAT) 0.4 MG SL tablet Place 1 tablet (0.4 mg total) under the tongue every 5 (five) minutes as needed. 25 tablet 3   OXYGEN Inhale 3 L into the lungs daily.     pantoprazole (PROTONIX) 40 MG tablet Take 40 mg by mouth 2 (two) times daily before a meal.     Spacer/Aero-Holding Chambers (AEROCHAMBER MV) inhaler Use as instructed 1 each 2   spironolactone (ALDACTONE) 25 MG tablet Take 1 tablet (25 mg total) by mouth daily. 90 tablet 3    sucralfate (CARAFATE) 1 g tablet Take 1 tablet (1 g total) by mouth 3 (three) times daily with meals. 1 hour before meals and medications 90 tablet 2   albuterol (VENTOLIN HFA) 108 (90 Base) MCG/ACT inhaler TAKE 2 PUFFS BY MOUTH EVERY 6 HOURS AS NEEDED FOR WHEEZE OR SHORTNESS OF BREATH 18 each 2   ipratropium-albuterol (DUONEB) 0.5-2.5 (3) MG/3ML SOLN Take 3 mLs by nebulization every 6 (six) hours as needed. 360 mL 11   predniSONE (DELTASONE) 20 MG tablet Take 1 tablet (20 mg total) by mouth daily with breakfast for 5 days. 5 tablet 0   No current facility-administered medications for this visit.

## 2024-03-06 NOTE — Assessment & Plan Note (Signed)
 He is on nasal cannula oxygen.

## 2024-03-14 ENCOUNTER — Other Ambulatory Visit: Payer: Self-pay | Admitting: Pulmonary Disease

## 2024-03-16 ENCOUNTER — Telehealth: Payer: Self-pay | Admitting: Cardiology

## 2024-03-16 NOTE — Telephone Encounter (Incomplete)
 Called to confirm/remind patient of their appointment [facility] on 03/19/24***.   Appointment:   [] Confirmed  [x] Left mess   [] No answer/No voice mail  [] Phone not in service  Patient reminded to bring all medications and/or complete list.  Confirmed patient has transportation. Gave directions, instructed to utilize valet parking.

## 2024-03-19 ENCOUNTER — Encounter: Payer: Self-pay | Admitting: Pulmonary Disease

## 2024-03-19 ENCOUNTER — Ambulatory Visit: Attending: Cardiology | Admitting: Cardiology

## 2024-03-19 VITALS — BP 115/57 | HR 70 | Wt 192.0 lb

## 2024-03-19 DIAGNOSIS — Z7984 Long term (current) use of oral hypoglycemic drugs: Secondary | ICD-10-CM | POA: Diagnosis not present

## 2024-03-19 DIAGNOSIS — R0602 Shortness of breath: Secondary | ICD-10-CM | POA: Diagnosis not present

## 2024-03-19 DIAGNOSIS — Z955 Presence of coronary angioplasty implant and graft: Secondary | ICD-10-CM | POA: Diagnosis not present

## 2024-03-19 DIAGNOSIS — R0789 Other chest pain: Secondary | ICD-10-CM | POA: Diagnosis not present

## 2024-03-19 DIAGNOSIS — Z79899 Other long term (current) drug therapy: Secondary | ICD-10-CM | POA: Insufficient documentation

## 2024-03-19 DIAGNOSIS — Z87891 Personal history of nicotine dependence: Secondary | ICD-10-CM | POA: Insufficient documentation

## 2024-03-19 DIAGNOSIS — Z9981 Dependence on supplemental oxygen: Secondary | ICD-10-CM | POA: Insufficient documentation

## 2024-03-19 DIAGNOSIS — I252 Old myocardial infarction: Secondary | ICD-10-CM | POA: Insufficient documentation

## 2024-03-19 DIAGNOSIS — I5022 Chronic systolic (congestive) heart failure: Secondary | ICD-10-CM | POA: Diagnosis present

## 2024-03-19 DIAGNOSIS — I2510 Atherosclerotic heart disease of native coronary artery without angina pectoris: Secondary | ICD-10-CM | POA: Diagnosis not present

## 2024-03-19 DIAGNOSIS — Z7982 Long term (current) use of aspirin: Secondary | ICD-10-CM | POA: Diagnosis not present

## 2024-03-19 DIAGNOSIS — K219 Gastro-esophageal reflux disease without esophagitis: Secondary | ICD-10-CM | POA: Diagnosis not present

## 2024-03-19 DIAGNOSIS — J449 Chronic obstructive pulmonary disease, unspecified: Secondary | ICD-10-CM | POA: Diagnosis not present

## 2024-03-19 DIAGNOSIS — I255 Ischemic cardiomyopathy: Secondary | ICD-10-CM | POA: Insufficient documentation

## 2024-03-19 MED ORDER — SACUBITRIL-VALSARTAN 49-51 MG PO TABS: 1.0000 | ORAL_TABLET | Freq: Two times a day (BID) | ORAL | 6 refills | Status: DC

## 2024-03-19 NOTE — Addendum Note (Signed)
 Addended by: Red Christians A on: 03/19/2024 03:15 PM   Modules accepted: Orders

## 2024-03-19 NOTE — Patient Instructions (Addendum)
 Medication Changes:  INCREASE Entresto 49-51mg  (1 tab) two times daily  Lab Work: TODAY Go DOWN to LOWER LEVEL (LL) to have your blood work completed inside of [employer] office.   IN TWO WEEKS Go over to the MEDICAL MALL. Go pass the gift shop and have your blood work completed.  We will only call you if the results are abnormal or if the provider would like to make medication changes.   Referrals:  You have been referred to [facility]'s pulmonary rehab. They will be in contact with you in order to schedule an appointment.   Follow-Up in: Please follow up with the [facility] in 3 months with Dr. Shirlee Latch. We currently do not have that schedule. Please give [place] a call in June in order to schedule your appointment for July.  At the [facility], you and your health needs are our priority. We have a designated team specialized in the treatment of Heart Failure. This Care Team includes your primary Heart Failure Specialized Cardiologist (physician), Advanced Practice Providers (APPs- Physician Assistants and Nurse Practitioners), and Pharmacist who all work together to provide you with the care you need, when you need it.   You may see any of the following providers on your designated Care Team at your next follow up:  Dr. Arvilla Meres Dr. Marca Ancona Dr. Dorthula Nettles Dr. Theresia Bough Clarisa Kindred, FNP Enos Fling, RPH-CPP  Please be sure to bring in all your medications bottles to every appointment.   Need to Contact us:  If you have any questions or concerns before your next appointment please send [place] a message through [place] or call our office at [phone].    TO LEAVE A MESSAGE FOR THE NURSE SELECT OPTION 2, PLEASE LEAVE A MESSAGE INCLUDING: YOUR NAME DATE OF BIRTH CALL BACK NUMBER REASON FOR CALL**this is important as we prioritize the call backs  YOU WILL RECEIVE A CALL BACK THE SAME DAY AS LONG AS YOU CALL BEFORE 4:00  PM

## 2024-03-19 NOTE — Progress Notes (Signed)
 PCP: [name], MD Cardiology: Dr. Kirke Corin HF Cardiology: Dr. Shirlee Latch  Chief complaint: CHF  73 y.o. with history of CAD, ischemic cardiomyopathy, and severe COPD on home oxygen was referred by Dr. Kirke Corin for evaluation of heart failure. Patient had an anterior MI in 2010.  At that time, he had DES to LAD and to RCA.  Repeat caths in 2017 and in 4/24 have shown patent stents and minimal progression of CAD.  Echoes showed EF 40-45% generally, then echo in 9/23 showed EF up to 50-55% though study was technically difficult.  LV-gram with cath in 4/24 suggested lower EF at 25-35%. Patient was a heavy smoker in the past, quit in 2020.  He has severe COPD and wears 3L home oxygen. 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    Due to progressive dyspnea and atypical chest pain, he had RHC/LHC done in 4/24.  Results of angiography and LV-gram as above, RHC showed normal filling pressures and preserved cardiac output.  He went to the ER yesterday with cough and worsening dyspnea, he was thought to have a COPD exacerbation and was given prednisone and nebs.    Echo in 5/24 showed EF 35-40%, mild LV dilation, normal RV.    Patient continues to wear 3L home oxygen.  He has had frequent GERD symptoms (has esophageal dysmotility post-esophageal cancer radiation therapy) and is on bother Carafate and Protonix.  Still has occasional episodes of "heart burn" but overall seems better than in the past.  No exertional chest pain.  Fatigues easily, not very active.  Generally does ok walking on flat ground if he is wearing his oxygen but notes shortness of breath washing dishes, sweeping, or doing any moderate exertion.  Has taken Lasix only twice in the last few months.  Occasional wheezing.  No lightheadedness or palpitations.                                                                                                        Labs (2/24): LDL 47 Labs (4/24): hgb 13.3, K 4.1, creatinine 0.95 Labs (6/24): BNP 42, K 3.9, creatinine 0.93 Labs (7/24): LDL 58 Labs (2/25): K 4.6, creatinine 1.15  PMH: 1. CAD: Anterior MI in 2010 with DES to LAD and RCA.  - LHC (2017): Patent stents.  - LHC (4/24): Mild nonobstructive CAD with patent stents.  2. Chronic systolic CHF: Ischemic cardiomyopathy.   - Echo (2019): EF 40-45% - Echo (2/22): EF 40-45% - Echo (9/23): Technically difficult, EF 50-55%.  - LV-gram (4/24): EF 25-35% - RHC (4/24): mean RA 6, PA 38/15, mean PCWP 15, CI 3.16 - Echo (5/24): EF 35-40%, mild LV dilation, normal RV. 3. COPD: On 3.5 L home oxygen, severe. Quit smoking 2020.  4. H/o PVCs 5. Esophageal squamous cell carcinoma: S/p chemo-radiation.  6. Prostate cancer: Radiation.   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  Vaping Use   Vaping status: Never Used  Substance and Sexual Activity   Alcohol use: No    Alcohol/week: 0.0 standard drinks of alcohol   Drug use: No   Sexual activity: Yes  Other Topics Concern   Not on file  Social History Narrative   Marital status: Widowed. Lives alone.    Social Drivers of Corporate investment banker Strain: Low Risk  (03/29/2023)   Received from [facility], [facility]   Overall Financial Resource Strain (CARDIA)    Difficulty of Paying Living  Expenses: Not hard at all  Food Insecurity: No Food Insecurity (03/29/2023)   Received from [facility], [facility]   Hunger Vital Sign    Worried About Running Out of Food in the Last Year: Never true    Ran Out of Food in the Last Year: Never true  Transportation Needs: No Transportation Needs (03/29/2023)   Received from [facility], [facility]   [facility] - Transportation    In the past 12 months, has lack of transportation kept you from medical appointments or from getting medications?: No    Lack of Transportation (Non-Medical): No  Physical Activity: Inactive (01/06/2022)   Received from [facility], [facility]   Exercise Vital Sign    Days of Exercise per Week: 0 days    Minutes of Exercise per Session: 0 min  Stress: Stress Concern Present (01/06/2022)   Received from [facility], [facility]   [employer] of Occupational Health - Occupational Stress Questionnaire    Feeling of Stress : Rather much  Social Connections: Unknown (01/06/2022)   Received from [facility], [facility]   Social Connection and Isolation Panel [NHANES]    Frequency of Communication with Friends and Family: Once a week    Frequency of Social Gatherings with Friends and Family: Once a week    Attends Religious Services: Not on file    Active Member of Clubs or Organizations: No    Attends Banker Meetings: Never    Marital Status: Widowed  Intimate Partner Violence: Not At Risk (11/14/2022)   Humiliation, Afraid, Rape, and Kick questionnaire    Fear of Current or Ex-Partner: No    Emotionally Abused: No    Physically Abused: No    Sexually Abused: No   Family History  Problem Relation Age of Onset   Heart attack Brother        Brother #1   Diabetes Brother    Hypertension Brother        #3   Coronary  artery disease Father 14       deceased   Heart attack Father    Diabetes Father    Heart disease Father    COPD Mother 98  deceased   Alcohol abuse Sister        polysubstance abuse   COPD Sister    Lung cancer Sister    Alcohol abuse Sister        polysubstance abuse   Penile cancer Brother    Diabetes Brother    Prostate cancer Neg Hx    Bladder Cancer Neg Hx    Kidney cancer Neg Hx    ROS: All systems reviewed and negative except as per HPI.   Current Outpatient Medications  Medication Sig Dispense Refill   albuterol (VENTOLIN HFA) 108 (90 Base) MCG/ACT inhaler TAKE 2 PUFFS BY MOUTH EVERY 6 HOURS AS NEEDED FOR WHEEZE OR SHORTNESS OF BREATH 18 each 2   ALPRAZolam (XANAX) 0.5 MG tablet Take 0.5 mg by mouth 3 (three) times daily.     aspirin 81 MG tablet Take 81 mg by mouth daily.     atorvastatin (LIPITOR) 80 MG tablet Take 1 tablet (80 mg total) by mouth daily. 90 tablet 3   bisoprolol (ZEBETA) 5 MG tablet TAKE 1/2 TABLET BY MOUTH DAILY 45 tablet 2   budesonide (PULMICORT) 0.5 MG/2ML nebulizer solution Take 2 mLs (0.5 mg total) by nebulization in the morning and at bedtime. 360 mL 10   Ensifentrine (OHTUVAYRE) 3 MG/2.5ML SUSP Inhale 3 mLs into the lungs 2 (two) times daily. DX:J44.9 360 mL 0   famotidine (PEPCID) 20 MG tablet Take 1 tablet (20 mg total) by mouth 2 (two) times daily. for breakthrough reflux 60 tablet 3   FARXIGA 10 MG TABS tablet TAKE 1 TABLET BY MOUTH DAILY BEFORE BREAKFAST. 30 tablet 6   FLUoxetine (PROZAC) 20 MG tablet Take 20 mg by mouth daily.     fluticasone (FLONASE) 50 MCG/ACT nasal spray Place 2 sprays into both nostrils daily.     furosemide (LASIX) 20 MG tablet Take 1 tablet (20 mg total) by mouth as needed. 30 tablet 1   ipratropium-albuterol (DUONEB) 0.5-2.5 (3) MG/3ML SOLN Take 3 mLs by nebulization every 6 (six) hours as needed. 360 mL 11   ketoconazole (NIZORAL) 2 % cream Apply topically 2 (two) times daily.     loratadine (CLARITIN) 10 MG  tablet Take 10 mg by mouth daily.     mirtazapine (REMERON) 45 MG tablet Take 45 mg by mouth at bedtime.     Multiple Vitamin (MULTIVITAMIN) capsule Take 1 capsule by mouth daily.     nitroGLYCERIN (NITROSTAT) 0.4 MG SL tablet Place 1 tablet (0.4 mg total) under the tongue every 5 (five) minutes as needed. 25 tablet 3   OXYGEN Inhale 3 L into the lungs daily.     pantoprazole (PROTONIX) 40 MG tablet Take 40 mg by mouth 2 (two) times daily before a meal.     Spacer/Aero-Holding Chambers (AEROCHAMBER MV) inhaler Use as instructed 1 each 2   spironolactone (ALDACTONE) 25 MG tablet Take 1 tablet (25 mg total) by mouth daily. 90 tablet 3   sucralfate (CARAFATE) 1 g tablet Take 1 tablet (1 g total) by mouth 3 (three) times daily with meals. 1 hour before meals and medications 90 tablet 2   No current facility-administered medications for this visit.   BP (!) 115/57   Pulse 70   Wt 192 lb (87.1 kg)   SpO2 94%   PF (!) 3 L/min   BMI 27.55 kg/m  General: NAD Neck: No JVD, no thyromegaly or thyroid nodule.  Lungs: Distant BS with end expiratory wheezes.  CV: Nondisplaced PMI.  Heart  regular S1/S2, no S3/S4, no murmur.  Trace ankle edema.  No carotid bruit.  Normal pedal pulses.  Abdomen: Soft, nontender, no hepatosplenomegaly, no distention.  Skin: Intact without lesions or rashes.  Neurologic: Alert and oriented x 3.  Psych: Normal affect. Extremities: No clubbing or cyanosis.  HEENT: Normal.   Assessment/Plan: 1. Chronic systolic CHF: Ischemic cardiomyopathy.  Historically, EF has been in the 40-45% range, but echo in 9/23 showed EF 50-55% (technically difficult study).  LV-gram with cath in 4/24 suggested that EF may be down to the 25-35% range.  He had no new coronary disease on cath in 4/24 to explain fall in EF.  Echo in 5/24 showed EF 35-40%, mild LV dilation, normal RV.  NYHA class III, but think severe COPD plays a major role in his exercise limitation.  He does not look significantly  volume overloaded on exam today.   - Continue spironolactone 25 daily.  - Increase Entresto to 49/51 bid. BMET/BNP today, BMET in 10 days.   - Continue Farxiga 10 mg daily.  - Continue beta-1 selective bisoprolol 2.5 mg daily.   - EF on last echo appears to be just out of range of ICD and narrow QRS precludes CRT use. I will arrange for repeat echo. 2. COPD: Severe, on 3 L home oxygen. Prior smoker.  As above, I suspect COPD is a major contributor to his dyspnea.  He does not appear volume overloaded on exam.  - Continue followup with pulmonary.  3.  CAD: Anterior MI in 2010 with DES to LAD and RCA.  Cath in 4/24 showed patent stents and mild nonobstructive CAD.  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  - Continue ASA 81 daily.  - Continue atorvastatin 80 mg daily, check lipids today.  - ECG today.   Followup in 3 months.   I spent 32 minutes reviewing records, interviewing/examining patient, and managing order   Marca Ancona 03/19/2024

## 2024-03-20 ENCOUNTER — Other Ambulatory Visit (HOSPITAL_COMMUNITY): Payer: Self-pay

## 2024-03-20 ENCOUNTER — Telehealth: Payer: Self-pay

## 2024-03-20 LAB — LIPID PANEL
Chol/HDL Ratio: 2.7 ratio (ref 0.0–5.0)
Cholesterol, Total: 147 mg/dL (ref 100–199)
HDL: 54 mg/dL (ref >39)
LDL Chol Calc (NIH): 69 mg/dL (ref 0–99)
Triglycerides: 137 mg/dL (ref 0–149)
VLDL Cholesterol Cal: 24 mg/dL (ref 5–40)

## 2024-03-20 LAB — BASIC METABOLIC PANEL WITH GFR
BUN/Creatinine Ratio: 15 (ref 10–24)
BUN: 15 mg/dL (ref 8–27)
CO2: 27 mmol/L (ref 20–29)
Calcium: 9.2 mg/dL (ref 8.6–10.2)
Chloride: 99 mmol/L (ref 96–106)
Creatinine, Ser: 0.99 mg/dL (ref 0.76–1.27)
Glucose: 100 mg/dL — ABNORMAL HIGH (ref 70–99)
Potassium: 4.6 mmol/L (ref 3.5–5.2)
Sodium: 142 mmol/L (ref 134–144)
eGFR: 82 mL/min/{1.73_m2} (ref >59)

## 2024-03-20 LAB — BRAIN NATRIURETIC PEPTIDE: BNP: 58.8 pg/mL (ref 0.0–100.0)

## 2024-03-20 MED ORDER — SACUBITRIL-VALSARTAN 49-51 MG PO TABS: 1.0000 | ORAL_TABLET | Freq: Two times a day (BID) | ORAL | Status: DC

## 2024-03-20 NOTE — Telephone Encounter (Signed)
 Pt called stating that he cannot afford his new dose of entresto. Advised him to come pick up samples in clinic and I would check with PharmD to see if there is any assistance he could qualify for.

## 2024-03-20 NOTE — Telephone Encounter (Signed)
 Advanced [facility] Failure Patient Advocate Encounter  The patient was approved for a [employer] grant that will help cover the cost of Entresto, Farxiga, Spironolactone.  Total amount awarded, $10,000.  Effective: 03/11/2024 - 03/10/2025.  BIN [id number] PCN [id number] Group [id number] ID [id number]  Pharmacy provided with approval and processing information. Confirmed $0 copay for Entresto  49/51 MG. Patient informed in office.  Burnell Blanks, CPhT Rx Patient Advocate Phone: [phone]

## 2024-03-23 ENCOUNTER — Telehealth: Payer: Self-pay

## 2024-03-23 MED ORDER — EZETIMIBE 10 MG PO TABS: 10.0000 mg | ORAL_TABLET | Freq: Every day | ORAL | 3 refills | Status: DC

## 2024-03-23 NOTE — Telephone Encounter (Signed)
-----   Message from Marca Ancona sent at 03/20/2024  5:21 PM EDT ----- Would like to see LDL < 55.  Add Zetia 10 mg daily.  Check lipids 2 months.

## 2024-03-23 NOTE — Telephone Encounter (Signed)
 Pt returned call and is agreeable to new medication. Script sent in to [facility] in [place] per pt.

## 2024-03-29 ENCOUNTER — Other Ambulatory Visit
Admission: RE | Admit: 2024-03-29 | Discharge: 2024-03-29 | Disposition: A | Discharge status: Home / Self Care | Attending: Cardiology | Admitting: Cardiology

## 2024-03-29 DIAGNOSIS — I5022 Chronic systolic (congestive) heart failure: Secondary | ICD-10-CM | POA: Insufficient documentation

## 2024-03-29 LAB — BASIC METABOLIC PANEL WITH GFR
Anion gap: 8 (ref 5–15)
BUN: 19 mg/dL (ref 8–23)
CO2: 26 mmol/L (ref 22–32)
Calcium: 8.1 mg/dL — ABNORMAL LOW (ref 8.9–10.3)
Chloride: 105 mmol/L (ref 98–111)
Creatinine, Ser: 1.01 mg/dL (ref 0.61–1.24)
GFR, Estimated: >60 mL/min (ref >60)
Glucose, Bld: 147 mg/dL — ABNORMAL HIGH (ref 70–99)
Potassium: 3.6 mmol/L (ref 3.5–5.1)
Sodium: 139 mmol/L (ref 135–145)

## 2024-04-03 ENCOUNTER — Ambulatory Visit: Attending: Pulmonary Disease

## 2024-04-03 DIAGNOSIS — R0609 Other forms of dyspnea: Secondary | ICD-10-CM | POA: Diagnosis present

## 2024-04-03 DIAGNOSIS — I5023 Acute on chronic systolic (congestive) heart failure: Secondary | ICD-10-CM | POA: Diagnosis present

## 2024-04-03 MED ORDER — PERFLUTREN LIPID MICROSPHERE
1.0000 mL | INTRAVENOUS | Status: AC | PRN
  Administered 2024-04-03: 2 mL via INTRAVENOUS

## 2024-04-04 LAB — ECHOCARDIOGRAM COMPLETE
AR max vel: 3.36 cm2
AV Area VTI: 3.13 cm2
AV Area mean vel: 3.18 cm2
AV Mean grad: 4 mmHg
AV Peak grad: 7.5 mmHg
Ao pk vel: 1.37 m/s
Area-P 1/2: 3.37 cm2

## 2024-04-15 ENCOUNTER — Other Ambulatory Visit: Payer: Self-pay | Admitting: Adult Health

## 2024-05-05 ENCOUNTER — Emergency Department

## 2024-05-05 ENCOUNTER — Emergency Department
Admission: EM | Admit: 2024-05-05 | Discharge: 2024-05-05 | Disposition: A | Discharge status: Home / Self Care | Attending: Emergency Medicine | Admitting: Emergency Medicine

## 2024-05-05 DIAGNOSIS — R0602 Shortness of breath: Secondary | ICD-10-CM | POA: Insufficient documentation

## 2024-05-05 DIAGNOSIS — I2510 Atherosclerotic heart disease of native coronary artery without angina pectoris: Secondary | ICD-10-CM | POA: Insufficient documentation

## 2024-05-05 DIAGNOSIS — J449 Chronic obstructive pulmonary disease, unspecified: Secondary | ICD-10-CM | POA: Insufficient documentation

## 2024-05-05 DIAGNOSIS — R0789 Other chest pain: Secondary | ICD-10-CM | POA: Diagnosis not present

## 2024-05-05 DIAGNOSIS — I509 Heart failure, unspecified: Secondary | ICD-10-CM | POA: Insufficient documentation

## 2024-05-05 DIAGNOSIS — R079 Chest pain, unspecified: Secondary | ICD-10-CM | POA: Diagnosis present

## 2024-05-05 DIAGNOSIS — E119 Type 2 diabetes mellitus without complications: Secondary | ICD-10-CM | POA: Insufficient documentation

## 2024-05-05 LAB — CBC
HCT: 41.7 % (ref 39.0–52.0)
Hemoglobin: 12.6 g/dL — ABNORMAL LOW (ref 13.0–17.0)
MCH: 28 pg (ref 26.0–34.0)
MCHC: 30.2 g/dL (ref 30.0–36.0)
MCV: 92.7 fL (ref 80.0–100.0)
Platelets: 195 10*3/uL (ref 150–400)
RBC: 4.5 MIL/uL (ref 4.22–5.81)
RDW: 14.1 % (ref 11.5–15.5)
WBC: 8.7 10*3/uL (ref 4.0–10.5)
nRBC: 0 % (ref 0.0–0.2)

## 2024-05-05 LAB — COMPREHENSIVE METABOLIC PANEL WITH GFR
ALT: 26 U/L (ref 0–44)
AST: 22 U/L (ref 15–41)
Albumin: 3.6 g/dL (ref 3.5–5.0)
Alkaline Phosphatase: 66 U/L (ref 38–126)
Anion gap: 11 (ref 5–15)
BUN: 17 mg/dL (ref 8–23)
CO2: 28 mmol/L (ref 22–32)
Calcium: 8.7 mg/dL — ABNORMAL LOW (ref 8.9–10.3)
Chloride: 103 mmol/L (ref 98–111)
Creatinine, Ser: 1.02 mg/dL (ref 0.61–1.24)
GFR, Estimated: >60 mL/min (ref >60)
Glucose, Bld: 131 mg/dL — ABNORMAL HIGH (ref 70–99)
Potassium: 4.1 mmol/L (ref 3.5–5.1)
Sodium: 142 mmol/L (ref 135–145)
Total Bilirubin: 0.5 mg/dL (ref 0.0–1.2)
Total Protein: 6.8 g/dL (ref 6.5–8.1)

## 2024-05-05 LAB — TROPONIN I (HIGH SENSITIVITY): Troponin I (High Sensitivity): 6 ng/L (ref <18)

## 2024-05-05 MED ORDER — IPRATROPIUM-ALBUTEROL 0.5-2.5 (3) MG/3ML IN SOLN
3.0000 mL | Freq: Once | RESPIRATORY_TRACT | Status: AC
  Administered 2024-05-05: 3 mL via RESPIRATORY_TRACT
  Filled 2024-05-05: qty 3

## 2024-05-05 NOTE — ED Triage Notes (Signed)
 C/o chest pain 3/10 sharp and pressure that started today around 5pm. States he has had increasing SHOB/ tightness. Productive cough with wheezing. On EMS arrival Sats 88%. Hx of COPD/ Heart stents. Gave 325 mg ASA, 1 Inch Nitro paste and solumedrol

## 2024-05-05 NOTE — ED Provider Notes (Signed)
 [facility] Provider Note    Event Date/Time   First MD Initiated Contact with Patient 05/05/24 2148     (approximate)   History   Chest Pain and Shortness of Breath   HPI  Leslie Sims is a 73 y.o. male with a history of COPD on 3 L nasal cannula at home, CHF, diabetes, CAD who presents with complaints of dull chest ache and shortness of breath that seems worse today.  He reports EMS treated with nebs and he is feeling somewhat improved     Physical Exam   Triage Vital Signs: ED Triage Vitals  Encounter Vitals Group     BP 05/05/24 2147 (!) 140/99     Systolic BP Percentile --      Diastolic BP Percentile --      Pulse Rate 05/05/24 2143 79     Resp 05/05/24 2143 20     Temp 05/05/24 2143 98.3 F (36.8 C)     Temp Source 05/05/24 2143 Oral     SpO2 05/05/24 2143 96 %     Weight --      Height --      Head Circumference --      Peak Flow --      Pain Score 05/05/24 2143 3     Pain Loc --      Pain Education --      Exclude from Growth Chart --     Most recent vital signs: Vitals:   05/05/24 2147 05/05/24 2230  BP: (!) 140/99 102/80  Pulse:  80  Resp:  15  Temp:    SpO2:  94%     General: Awake, no distress.  CV:  Good peripheral perfusion.  Regular rate and rhythm Resp:  Normal effort.  Scattered mild wheezes Abd:  No distention.  Other:  No calf pain or swelling   ED Results / Procedures / Treatments   Labs (all labs ordered are listed, but only abnormal results are displayed) Labs Reviewed  CBC - Abnormal; Notable for the following components:      Result Value   Hemoglobin 12.6 (*)    All other components within normal limits  COMPREHENSIVE METABOLIC PANEL WITH GFR - Abnormal; Notable for the following components:   Glucose, Bld 131 (*)    Calcium  8.7 (*)    All other components within normal limits  TROPONIN I (HIGH SENSITIVITY)     EKG  ED ECG REPORT I, Bryson Carbine, the attending physician,  personally viewed and interpreted this ECG.  Date: 05/05/2024  Rhythm: normal sinus rhythm QRS Axis: normal Intervals: Left bundle branch block, chronic ST/T Wave abnormalities: normal Narrative Interpretation: no evidence of acute ischemia    RADIOLOGY Chest x-ray without acute abnormality, I viewed interpret by me    PROCEDURES:  Critical Care performed:   Procedures   MEDICATIONS ORDERED IN ED: Medications  ipratropium-albuterol  (DUONEB) 0.5-2.5 (3) MG/3ML nebulizer solution 3 mL (3 mLs Nebulization Given 05/05/24 2217)     IMPRESSION / MDM / ASSESSMENT AND PLAN / ED COURSE  I reviewed the triage vital signs and the nursing notes. Patient's presentation is most consistent with acute presentation with potential threat to life or bodily function.  Patient with a history of CAD, diabetes, CHF COPD presents with chest discomfort, shortness of breath as detailed above.  Differential includes COPD exacerbation, ACS, angina, pneumonia  Wheezing on exam improved with DuoNeb, chest x-ray without evidence of pneumonia or pneumothorax.  High sensitive troponin and EKG are reassuring  Patient is reassured by workup, offered admission however he declined, he reports he will return if any change in his symptoms.  Appropriate for discharge at this time.        FINAL CLINICAL IMPRESSION(S) / ED DIAGNOSES   Final diagnoses:  Atypical chest pain     Rx / DC Orders   ED Discharge Orders     None        Note:  This document was prepared using Dragon voice recognition software and may include unintentional dictation errors.   Bryson Carbine, MD 05/05/24 2308

## 2024-05-05 NOTE — ED Notes (Signed)
 Pt discharge information reviewed. Pt understands need for follow up care and when to return if symptoms worsen. All questions answered. Pt is alert and oriented with even and regular respirations. Pt brought out of department in wheelchair and picked up by family.

## 2024-05-08 ENCOUNTER — Ambulatory Visit
Admission: RE | Admit: 2024-05-08 | Discharge: 2024-05-08 | Disposition: A | Discharge status: Home / Self Care | Source: Ambulatory Visit | Attending: Pulmonary Disease | Admitting: Pulmonary Disease

## 2024-05-08 ENCOUNTER — Encounter: Payer: Self-pay | Admitting: Pulmonary Disease

## 2024-05-08 ENCOUNTER — Ambulatory Visit (INDEPENDENT_AMBULATORY_CARE_PROVIDER_SITE_OTHER): Admitting: Pulmonary Disease

## 2024-05-08 VITALS — BP 110/60 | HR 106 | Temp 97.7°F | Ht 70.0 in | Wt 190.6 lb

## 2024-05-08 DIAGNOSIS — J9611 Chronic respiratory failure with hypoxia: Secondary | ICD-10-CM

## 2024-05-08 DIAGNOSIS — J189 Pneumonia, unspecified organism: Secondary | ICD-10-CM

## 2024-05-08 DIAGNOSIS — J449 Chronic obstructive pulmonary disease, unspecified: Secondary | ICD-10-CM

## 2024-05-08 DIAGNOSIS — J441 Chronic obstructive pulmonary disease with (acute) exacerbation: Secondary | ICD-10-CM | POA: Insufficient documentation

## 2024-05-08 DIAGNOSIS — Z87891 Personal history of nicotine dependence: Secondary | ICD-10-CM

## 2024-05-08 DIAGNOSIS — Z9981 Dependence on supplemental oxygen: Secondary | ICD-10-CM

## 2024-05-08 MED ORDER — AMOXICILLIN-POT CLAVULANATE 875-125 MG PO TABS: 1.0000 | ORAL_TABLET | Freq: Two times a day (BID) | ORAL | 0 refills | Status: DC

## 2024-05-08 MED ORDER — AZITHROMYCIN 500 MG PO TABS: 500.0000 mg | ORAL_TABLET | Freq: Every day | ORAL | 0 refills | Status: AC

## 2024-05-08 MED ORDER — METHYLPREDNISOLONE ACETATE 80 MG/ML IJ SUSP
80.0000 mg | Freq: Once | INTRAMUSCULAR | Status: AC
  Administered 2024-05-08: 80 mg via INTRAMUSCULAR

## 2024-05-08 MED ORDER — METHYLPREDNISOLONE 4 MG PO TBPK: ORAL_TABLET | ORAL | 0 refills | Status: DC

## 2024-05-08 NOTE — Progress Notes (Signed)
 Subjective:    Patient ID: Leslie Sims, male    DOB: 1951-11-05, 73 y.o.   MRN: [id number]  Patient Care Team: Valere Gata, MD as PCP - General (Family Medicine) Wenona Hamilton, MD as PCP - Cardiology (Cardiology) Wenona Hamilton, MD as Consulting Physician (Cardiology) Drake Gens, RN as Registered Nurse Marc Senior, MD as Consulting Physician (Pulmonary Disease) Glenis Langdon, MD as Radiation Oncologist (Radiation Oncology) Rochell Chroman, RN as Oncology Nurse Navigator Timmy Forbes, MD as Consulting Physician (Oncology) Lenell Query, MD (Neurology) Timmy Forbes, MD as Consulting Physician (Oncology)  Chief Complaint  Patient presents with   Follow-up    Chest congestion, cough, and wheezing x 1 week.     BACKGROUND/INTERVAL:72 M, former smoker quit 02/10/2019 with severe emphysema seen by multiple pulmonologists in past ([place], [place], [place], [place], [place]) followed here for management of COPD. Last seen by  me on 06 March 2024. He has been on Ohtuvayre  as well as DuoNeb via neb.  He has also an ischemic cardiomyopathy and prior history of esophageal cancer.  Has chronic respiratory failure with hypoxia on chronic oxygen  therapy.   HPI Discussed the use of AI scribe software for clinical note transcription with the patient, who gave verbal consent to proceed.  History of Present Illness   Leslie Sims is a 73 year old male with severe COPD and chronic respiratory failure who presents with increased mucus production and difficulty breathing over the last week.  He has been experiencing significant difficulty breathing and increased mucus production for about a week. Despite using his nebulizer, he continues to experience wheezing and has no strength. He feels 'clogged up with mucus' and has persistent tightness in his chest.  He visited the emergency room on 17th May where he was given a nebulizer treatment and sent home. However, his symptoms  have persisted despite this intervention. He used his nebulizer about an hour prior to the visit, but continues to feel tightness in his chest.  A chest x-ray was performed today.,  See results below.   DATA/EVENTS: 07/24/12 Office Spirometry: Severe obstruction (FEV1 31% pred) 08/31/13 Office Spirometry: very severe obstruction (FEV1 25% pred) 06/18/15 CT chest: Severe emphysema 03/03/17 Overnight oximetry: Lowest SpO2 72%. 95% of time SpO2 was below 90% 03/03/17 : 384 meters. Desat to 84% 10/04/17 CT chest: Two new irregularly marginated nodules, one in the RUL, and a second in the mid LUL worrisome for either synchronous lung ca or metastatic involvement of the lungs. The two small nodules described on the prior dictated report have resolved in the left superior segment and most likely were postinflammatory. Diffuse centrilobular emphysema. 10/13/17 PET scan: The new left upper lobe nodule is FDG avid consistent with malignancy. Recommend tissue confirmation. The new nodule in the right upper lobe demonstrates low level uptake. While the level of uptake suggests the possibility of an inflammatory or infectious process, a low-grade malignancy is not excluded. Given the suspected malignancy on the left, tissue confirmation should be considered on the right despite the low level of uptake. No distant metastatic disease identified 10/27/17 ENB ([facility]): Transbronchial Fine Needle Aspirations 21G X7, Transbronchial Forceps Biopsy X6. ALVEOLATED LUNG TISSUE WITH HEMOSIDERIN LADEN MACROPHAGES.  NEGATIVE FOR ATYPIA AND MALIGNANCY 11/01/17 PFTs: FVC: 2.96 > 3.14 L (68 > 72 %pred), FEV1: 0.94 > 0.93 L (27 %pred), FEV1/FVC: 32%, TLC: 9.04 L (137 %pred), DLCO 62 %pred 12/07/17 CTA chest: No pulmonary embolus identified. Anterior left upper lobe ground-glass and  nodular consolidation likely representing pneumonitis. Stable bilateral upper lobe pulmonary nodules. Stable emphysema. 01/21/18 CT chest: new  masslike architectural distortion and ground-glass attenuation surrounding the index nodule within the RUL. The appearance favors an inflammatory or infectious process. The index nodule in the left upper lobe is slightly decreased in volume when compared with the previous exam favoring a benign process. Attention on follow-up imaging advise. Advanced changes of emphysema (very severe) 06/01/18 CTA chest: No evidence of pulmonary embolism. Waxing and waning bilateral pulmonary nodular process as described. New 8 mm nodular ill-defined focus of opacification over the lingula which may represent an acute inflammatory or infectious process. No effusion. Emphysema 10/19/18 CTA chest: No acute cardiopulmonary disease and no evidence of pulm embolism. Hospitalization 1/12-1/14/2020: COPD exacerbation Hospitalization 2/22-2/27/20: COPD exacerbation, HCAP 02/11/19 CT chest: Progressed masslike consolidation within the RUL in the region of previous waxing and waning nodule, this is contiguous with the pleural surface and demonstrates spiculated margin and probable small focus of central cavitation. Findings could be secondary to infection superimposed on chronic nodule. However cavitary neoplasm is also a concern. Multiple additional lung nodules which have not significantly changed. Small foci of ground-glass density in the RUL may reflect small focus of infection. Mod-severe emphysema 05/01/19 CT chest: Waxing/waning right lung opacities, suggesting infection, including atypical/mycobacterial infection. Improvement of the prior dominant masslike opacity argues against neoplasm. Stable left upper lobe nodule, likely benign. Aortic Atherosclerosis and Emphysema 11/02/2019 CT chest: Multiple larger pulmonary nodules and masslike areas seen on the prior study have largely regressed.  Findings indicative of infectious/inflammatory process.  Diffuse bronchial wall thickening with moderate centrilobular and paraseptal  emphysema.  Suggestion of left main and three-vessel coronary artery disease. 04/23/2020 CT chest: "Multiple bilateral stable pulmonary nodules. New 7 mm nodule over the posterior left upper lobe. Stable nodularity over an area ofright upper lobe/apical scarring with new 9 mm nodular focus along the most inferior aspect of this scarring. This area has shown to wax and wane on previous exams and is most likely inflammatory". 07/24/2020 CT chest: "Background of marked pulmonary emphysema with stable post infectious or inflammatory changes and with near complete resolution of a presumably infectious or inflammatory nodule in the LEFT upper lobe as compared to previous exam". 01/21/2021 CT chest: Severe emphysematous changes, pulmonary scarring, bilateral calcified granulomas. 01/29/2021 PFTs: Further decline in lung function.  FEV1 0.74 L or 22% predicted, FVC 1.91 L or 42% predicted.  No significant bronchodilator response.  FEV1/FVC 39%.  Patient could not perform lung volumes. Diffusion capacity severely reduced.  This is consistent with very severe COPD. 02/13/2021 2D echo: LVEF 40 to 45% global hypokinesis, severe hypokinesis of the periapical region.  Grade II diastolic dysfunction, dilated left atrium. 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  Biopsies high-grade dysplastic. 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 05/09/2022 PET/CT: Intense tracer uptake associated with the distal esophageal mass. 06/03/2022 chemotherapy-XRT: Patient began concurrent carboplatin /Taxol /XRT. 07/26/2022 completed XRT: Completing chemotherapy. 09/02/2022 PET/CT: Distal esophageal wall thickening with significant reduction in abnormal FDG activity, small focus of minimally metabolic activity, possibly reflecting posttreatment change.  No evidence of hypermetabolic metastatic disease. 09/09/2022 echocardiogram: LVEF  50 to 55%, grade 1 DD, normal RV function, 10/11/2022 chest x-ray: Hyperinflation of the lungs no acute cardiopulmonary disease.  Granuloma of the LEFT upper lobe. 12/06/2022 PET/CT: No evidence of local recurrence or metastatic disease after treatment for esophageal cancer.  Stable bilateral adrenal adenomas. 01/16/2023  chest x-ray: Changes consistent with COPD with no active disease. 01/26/2023 barium swallow: Moderate esophageal dysmotility, markedly delayed passage of barium tablet at the level of the GE junction passing after approximately 20 minutes. 04/04/2023 right and left heart cath: LVEF 25 to 35%, moderately elevated LV end-diastolic pressure, moderate to severe left ventricular systolic dysfunction, moderate to severely reduced LV systolic function with akinesis of mid to distal anterior and apical myocardium.  Right heart catheterization showed normal RA pressure, mild pulmonary hypertension mild elevated wedge pressure and normal cardiac output.  Patient referred to advanced heart failure clinic. 05/02/2023 echocardiogram: LVEF 35 to 40%, moderate decreased function on the LV, LV with regional wall motion abnormalities.  LV internal cavity dilated.  Grade 1 DD severe hypokinesis of the left ventricular, mid apical anterior wall, anterolateral wall and apical segment.  LA mildly dilated.  No valvular abnormalities.    Review of Systems A 10 point review of systems was performed and it is as noted above otherwise negative.   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   COPD exacerbation (HCC) 07/31/2021   Prostate cancer (HCC) 10/11/2020   Elevated PSA 07/28/2020   Iron deficiency anemia 05/20/2020   Goals of care, counseling/discussion    Palliative care by specialist    Healthcare-associated pneumonia 01/28/2019   COPD with acute exacerbation (HCC) 12/31/2018   Generalized anxiety disorder 11/12/2018   Moderate episode of recurrent major depressive disorder (HCC) 11/12/2018   Chronic respiratory failure with hypoxia (HCC) 09/29/2018   Acute respiratory failure with hypoxia and hypercapnia (HCC) 09/13/2018   Acute on chronic respiratory failure with hypoxia and hypercapnia (HCC) 08/24/2018   Atypical chest pain 07/02/2018   Grief reaction 01/13/2018   COPD with hypoxia (HCC) 12/25/2017   Unstable angina (HCC)    Controlled type 2 diabetes mellitus without complication, without long-term current use of insulin  (HCC) 10/25/2016   Benign neoplasm of ascending colon    Benign neoplasm of descending colon    Benign neoplasm of sigmoid colon    History of colonic polyps    Pulmonary nodules/lesions, multiple 01/05/2015   Bruit of left carotid artery 11/04/2014   Sleep disorder 07/18/2013   Seasonal and perennial allergic rhinitis 05/29/2013   Bradycardia 04/20/2011   SMOKER 07/30/2010   Coronary artery disease 04/23/2010   Hyperlipidemia 03/24/2010   DEPRESSION/ANXIETY 03/24/2010   Chronic heart failure with preserved ejection fraction (HFpEF) (HCC) 03/24/2010   COPD, very severe (HCC) 03/24/2010   GERD (gastroesophageal reflux disease) 03/24/2010   Essential hypertension 11/21/2009    Social History   Tobacco Use   Smoking status: Former    Current packs/day: 0.00    Average packs/day: 3.0 packs/day for 42.0 years (126.0 ttl pk-yrs)    Types: Cigarettes    Start date: 02/10/1977    Quit date: 02/10/2019    Years since quitting: 5.2   Smokeless tobacco:  Never   Tobacco comments:    25 months ago most smoked 4 packs a day .  Substance Use Topics   Alcohol  use: No    Alcohol/week: 0.0 standard drinks of alcohol    Allergies  Allergen Reactions   Prozac  [Fluoxetine  Hcl] Shortness Of Breath   Hydroxyzine     Bupropion Other (See Comments)    "Makes me feel funny" Other reaction(s): Other (See Comments) Unknown/"made me feel real funny" "Makes me feel funny" "Makes me feel funny" Other reaction(s): Other (See Comments) Unknown/"made me feel real funny" "Makes me feel funny"   Effexor Xr [Venlafaxine Hcl Er] Other (See Comments)    "Makes me feel funny"    Current Meds  Medication Sig   albuterol  (VENTOLIN  HFA) 108 (90 Base) MCG/ACT inhaler TAKE 2 PUFFS BY MOUTH EVERY 6 HOURS AS NEEDED FOR WHEEZE OR SHORTNESS OF BREATH   ALPRAZolam  (XANAX ) 0.5 MG tablet Take 0.5 mg by mouth 3 (three) times daily.   aspirin  81 MG tablet Take 81 mg by mouth daily.   atorvastatin  (LIPITOR) 80 MG tablet Take 1 tablet (80 mg total) by mouth daily.   [EXPIRED] azithromycin  (ZITHROMAX ) 500 MG tablet Take 1 tablet (500 mg total) by mouth daily for 3 days. Take 1 tablet daily for 3 days.   bisoprolol  (ZEBETA ) 5 MG tablet TAKE 1/2 TABLET BY MOUTH DAILY   budesonide  (PULMICORT ) 0.5 MG/2ML nebulizer solution Take 2 mLs (0.5 mg total) by nebulization in the morning and at bedtime.   Ensifentrine  (OHTUVAYRE ) 3 MG/2.5ML SUSP Inhale 3 mLs into the lungs 2 (two) times daily. DX:J44.9 (Patient not taking: Reported on 05/15/2024)   ezetimibe  (ZETIA ) 10 MG tablet Take 1 tablet (10 mg total) by mouth daily.   famotidine  (PEPCID ) 20 MG tablet TAKE 1 TABLET (20 MG TOTAL) BY MOUTH 2 (TWO) TIMES DAILY. FOR BREAKTHROUGH REFLUX   FARXIGA  10 MG TABS tablet TAKE 1 TABLET BY MOUTH DAILY BEFORE BREAKFAST.   FLUoxetine  (PROZAC ) 20 MG tablet Take 20 mg by mouth daily.   fluticasone  (FLONASE ) 50 MCG/ACT nasal spray Place 2 sprays into both nostrils daily.   furosemide  (LASIX ) 20 MG  tablet Take 1 tablet (20 mg total) by mouth as needed.   ipratropium-albuterol  (DUONEB) 0.5-2.5 (3) MG/3ML SOLN Take 3 mLs by nebulization every 6 (six) hours as needed.   ketoconazole  (NIZORAL ) 2 % cream Apply 1 Application topically daily.   loratadine  (CLARITIN ) 10 MG tablet Take 10 mg by mouth daily.   mirtazapine  (REMERON ) 45 MG tablet Take 45 mg by mouth at bedtime.   Multiple Vitamin (MULTIVITAMIN) capsule Take 1 capsule by mouth daily.   nitroGLYCERIN  (NITROSTAT ) 0.4 MG SL tablet Place 1 tablet (0.4 mg total) under the tongue every 5 (five) minutes as needed.   OXYGEN  Inhale 3 L into the lungs daily.   pantoprazole  (PROTONIX ) 40 MG tablet Take 40 mg by mouth 2 (two) times daily before a meal.   sacubitril -valsartan  (ENTRESTO ) 49-51 MG Take 1 tablet by mouth 2 (two) times daily.   Spacer/Aero-Holding Chambers (AEROCHAMBER MV) inhaler Use as instructed   sucralfate  (CARAFATE ) 1 g tablet Take 1 tablet (1 g total) by mouth 3 (three) times daily with meals. 1 hour before meals and medications   [DISCONTINUED] amoxicillin -clavulanate (AUGMENTIN ) 875-125 MG tablet Take 1 tablet by mouth 2 (two) times daily for 7 days.   [DISCONTINUED] methylPREDNISolone  (MEDROL  DOSEPAK) 4 MG TBPK tablet Take as directed on the package, this is a taper pack (Patient not taking: Reported on 05/15/2024)    Immunization History  Administered Date(s) Administered   Fluad Quad(high Dose 65+) 11/16/2021, 11/05/2022   Influenza Whole 09/19/2012  Influenza, High Dose Seasonal PF 09/28/2019, 10/21/2023   Influenza,inj,Quad PF,6+ Mos 10/24/2013, 11/25/2014, 03/02/2016, 10/27/2016, 10/05/2017   Influenza-Unspecified 10/24/2013, 11/25/2014, 03/02/2016, 10/27/2016, 10/05/2017, 09/18/2018, 09/28/2019, 09/30/2020   Moderna Covid-19 Fall Seasonal Vaccine 76yrs & older 11/05/2022, 03/29/2023, 10/21/2023, 03/30/2024   Moderna Covid-19 Seasonal Vaccine 6 months thru 71years of age 28/17/2023   Moderna Sars-Covid-2 Vaccination  01/26/2020, 02/23/2020   PFIZER Comirnaty(Gray Top)Covid-19 Tri-Sucrose Vaccine 12/15/2020, 07/15/2021   PFIZER(Purple Top)SARS-COV-2 Vaccination 01/26/2020, 02/23/2020   PNEUMOCOCCAL CONJUGATE-20 03/22/2022   Pfizer Covid-19 Vaccine Bivalent Booster 68yrs & up 01/14/2022, 05/25/2022   Pneumococcal Conjugate-13 11/25/2014   Pneumococcal Polysaccharide-23 08/24/2016, 10/27/2016   Pneumococcal-Unspecified 12/20/2008   Rsv, Bivalent, Protein Subunit Rsvpref,pf Pattricia Bores) 02/20/2023   Tdap 12/20/2008        Objective:     BP 110/60 (BP Location: Left Arm, Patient Position: Sitting, Cuff Size: Normal)   Pulse (!) 106   Temp 97.7 F (36.5 C) (Temporal)   Ht 5\' 10"  (1.778 m)   Wt 190 lb 9.6 oz (86.5 kg)   SpO2 91% Comment: 3L pulse oxygen   BMI 27.35 kg/m   SpO2: 91 % (3L pulse oxygen )  GENERAL: Well-developed well-nourished gentleman, chronic use of accessories, no conversational dyspnea. No acute distress.  Presents in transport chair due to dyspnea. Using oxygen  via portable concentrator at 4 L pulsed. HEAD: Normocephalic, atraumatic. EYES: Pupils equal, round, reactive to light.  No scleral icterus. MOUTH: Wears dentures. NECK: Supple. No thyromegaly. No nodules. No JVD.  Trachea midline, no crepitus. PULMONARY: Very distant breath sounds, worse on left base, air movement poor, end expiratory wheezes throughout.  No rhonchi. CARDIOVASCULAR: S1 and S2. Regular rate and rhythm.  No rubs murmurs or gallops heard. GASTROINTESTINAL: Protuberant abdomen, benign. MUSCULOSKELETAL: No joint deformity, no clubbing, 1+ pitting edema feet/ankles. NEUROLOGIC: Awake, alert, oriented.  No overt focal deficits.  Speech is fluent. SKIN: Intact,warm,dry.  Limited exam: No rashes. PSYCH: Normal mood and behavior.  Chest x-ray obtained today consistent with left lung infiltrate (upper and lower):    Assessment & Plan:     ICD-10-CM   1. Stage 4 very severe COPD by GOLD classification (HCC)   J44.9     2. COPD with acute exacerbation (HCC)  J44.1 DG Chest 2 View    methylPREDNISolone  acetate (DEPO-MEDROL ) injection 80 mg    3. Chronic respiratory failure with hypoxia (HCC)  J96.11 DG Chest 2 View      Orders Placed This Encounter  Procedures   DG Chest 2 View    Standing Status:   Future    Number of Occurrences:   1    Expiration Date:   05/08/2025    Reason for Exam (SYMPTOM  OR DIAGNOSIS REQUIRED):   Dyspnea, r/o pneumonia    Preferred imaging location?:   [facility]    Meds ordered this encounter  Medications   methylPREDNISolone  acetate (DEPO-MEDROL ) injection 80 mg   methylPREDNISolone  (MEDROL  DOSEPAK) 4 MG TBPK tablet    Sig: Take as directed on the package, this is a taper pack    Dispense:  21 tablet    Refill:  0   azithromycin  (ZITHROMAX ) 500 MG tablet    Sig: Take 1 tablet (500 mg total) by mouth daily for 3 days. Take 1 tablet daily for 3 days.    Dispense:  3 tablet    Refill:  0   amoxicillin -clavulanate (AUGMENTIN ) 875-125 MG tablet    Sig: Take 1 tablet by mouth 2 (two) times daily for 7 days.  Dispense:  14 tablet    Refill:  0   Discussion:    Severe COPD with chronic respiratory failure Exacerbation of severe COPD with chronic respiratory failure characterized by increased mucus production, wheezing, and dyspnea for one week.  Chest x-ray shows pneumonia in the left lung consistent with physical exam.  Recent emergency room visit included nebulizer treatment. Treatment plan includes corticosteroids and antibiotics to manage exacerbation and treat pneumonia. - Administer Solu-Medrol  80 mg IM once - Start Medrol  dose pack on 5/21 - Prescribe azithromycin  500 mg daily for 3 days  - Augmentin  875 mg twice daily for 7 days - Follow-up 3 to 4 weeks time, call sooner should any new problems arise     Advised if symptoms do not improve or worsen, to please contact office for sooner follow up or seek emergency care.    I spent 42 minutes  of dedicated to the care of this patient on the date of this encounter to include pre-visit review of records, face-to-face time with the patient discussing conditions above, post visit ordering of testing, clinical documentation with the electronic health record, making appropriate referrals as documented, and communicating necessary findings to members of the patients care team.     C. Chloe Counter, MD Advanced Bronchoscopy PCCM [facility]    *This note was generated using voice recognition software/Dragon and/or AI transcription program.  Despite best efforts to proofread, errors can occur which can change the meaning. Any transcriptional errors that result from this process are unintentional and may not be fully corrected at the time of dictation.

## 2024-05-08 NOTE — Patient Instructions (Signed)
 VISIT SUMMARY:  Leslie Sims, you were seen today for increased mucus production and difficulty breathing related to your severe COPD and chronic respiratory failure. Despite recent treatment in the emergency room, your symptoms have persisted.  YOUR PLAN:  -SEVERE COPD WITH CHRONIC RESPIRATORY FAILURE: Chronic Obstructive Pulmonary Disease (COPD) is a long-term lung condition that makes it hard to breathe. Chronic respiratory failure means your lungs are not getting enough oxygen  into your blood. Your symptoms have worsened recently, and we are treating this exacerbation with medications to reduce inflammation and prevent infection. You received a Solu-Medrol  injection today, and you will start a Medrol  dose pack on 5/21. Additionally, you will take azithromycin  500 mg daily for 3 days to address any potential infection.  INSTRUCTIONS:  Please follow the medication plan as discussed. Start the Medrol  dose pack on 5/21 and take azithromycin  500 mg daily for 3 days. If your symptoms do not improve or worsen, please seek medical attention immediately.  Will see you in follow-up otherwise in 3 to 4 weeks time call sooner should any new problems arise.

## 2024-05-09 ENCOUNTER — Ambulatory Visit: Payer: Self-pay | Admitting: Pulmonary Disease

## 2024-05-14 ENCOUNTER — Other Ambulatory Visit: Payer: Self-pay

## 2024-05-14 ENCOUNTER — Inpatient Hospital Stay
Admission: EM | Admit: 2024-05-14 | Discharge: 2024-05-19 | DRG: 871 | Disposition: A | Discharge status: Home Health | Attending: Internal Medicine | Admitting: Internal Medicine

## 2024-05-14 ENCOUNTER — Emergency Department

## 2024-05-14 DIAGNOSIS — Z811 Family history of alcohol abuse and dependence: Secondary | ICD-10-CM

## 2024-05-14 DIAGNOSIS — Z888 Allergy status to other drugs, medicaments and biological substances status: Secondary | ICD-10-CM

## 2024-05-14 DIAGNOSIS — Z9221 Personal history of antineoplastic chemotherapy: Secondary | ICD-10-CM | POA: Diagnosis not present

## 2024-05-14 DIAGNOSIS — Z801 Family history of malignant neoplasm of trachea, bronchus and lung: Secondary | ICD-10-CM

## 2024-05-14 DIAGNOSIS — Z923 Personal history of irradiation: Secondary | ICD-10-CM | POA: Diagnosis not present

## 2024-05-14 DIAGNOSIS — Z9981 Dependence on supplemental oxygen: Secondary | ICD-10-CM | POA: Diagnosis not present

## 2024-05-14 DIAGNOSIS — J9622 Acute and chronic respiratory failure with hypercapnia: Secondary | ICD-10-CM | POA: Diagnosis present

## 2024-05-14 DIAGNOSIS — J441 Chronic obstructive pulmonary disease with (acute) exacerbation: Secondary | ICD-10-CM | POA: Diagnosis not present

## 2024-05-14 DIAGNOSIS — I5042 Chronic combined systolic (congestive) and diastolic (congestive) heart failure: Secondary | ICD-10-CM | POA: Diagnosis present

## 2024-05-14 DIAGNOSIS — K2100 Gastro-esophageal reflux disease with esophagitis, without bleeding: Secondary | ICD-10-CM | POA: Diagnosis not present

## 2024-05-14 DIAGNOSIS — J440 Chronic obstructive pulmonary disease with acute lower respiratory infection: Secondary | ICD-10-CM | POA: Diagnosis present

## 2024-05-14 DIAGNOSIS — I2510 Atherosclerotic heart disease of native coronary artery without angina pectoris: Secondary | ICD-10-CM | POA: Diagnosis present

## 2024-05-14 DIAGNOSIS — Z833 Family history of diabetes mellitus: Secondary | ICD-10-CM | POA: Diagnosis not present

## 2024-05-14 DIAGNOSIS — J9621 Acute and chronic respiratory failure with hypoxia: Secondary | ICD-10-CM | POA: Diagnosis present

## 2024-05-14 DIAGNOSIS — I255 Ischemic cardiomyopathy: Secondary | ICD-10-CM | POA: Diagnosis present

## 2024-05-14 DIAGNOSIS — Z87891 Personal history of nicotine dependence: Secondary | ICD-10-CM | POA: Diagnosis not present

## 2024-05-14 DIAGNOSIS — B9689 Other specified bacterial agents as the cause of diseases classified elsewhere: Secondary | ICD-10-CM | POA: Diagnosis not present

## 2024-05-14 DIAGNOSIS — E785 Hyperlipidemia, unspecified: Secondary | ICD-10-CM | POA: Diagnosis present

## 2024-05-14 DIAGNOSIS — K219 Gastro-esophageal reflux disease without esophagitis: Secondary | ICD-10-CM | POA: Diagnosis present

## 2024-05-14 DIAGNOSIS — A419 Sepsis, unspecified organism: Principal | ICD-10-CM | POA: Diagnosis present

## 2024-05-14 DIAGNOSIS — Z8049 Family history of malignant neoplasm of other genital organs: Secondary | ICD-10-CM

## 2024-05-14 DIAGNOSIS — R197 Diarrhea, unspecified: Secondary | ICD-10-CM | POA: Diagnosis present

## 2024-05-14 DIAGNOSIS — J9602 Acute respiratory failure with hypercapnia: Secondary | ICD-10-CM | POA: Diagnosis present

## 2024-05-14 DIAGNOSIS — J189 Pneumonia, unspecified organism: Secondary | ICD-10-CM

## 2024-05-14 DIAGNOSIS — I110 Hypertensive heart disease with heart failure: Secondary | ICD-10-CM | POA: Diagnosis present

## 2024-05-14 DIAGNOSIS — R54 Age-related physical debility: Secondary | ICD-10-CM | POA: Diagnosis present

## 2024-05-14 DIAGNOSIS — Z8711 Personal history of peptic ulcer disease: Secondary | ICD-10-CM

## 2024-05-14 DIAGNOSIS — Z825 Family history of asthma and other chronic lower respiratory diseases: Secondary | ICD-10-CM

## 2024-05-14 DIAGNOSIS — Z7982 Long term (current) use of aspirin: Secondary | ICD-10-CM

## 2024-05-14 DIAGNOSIS — J9601 Acute respiratory failure with hypoxia: Secondary | ICD-10-CM | POA: Diagnosis not present

## 2024-05-14 DIAGNOSIS — Z7951 Long term (current) use of inhaled steroids: Secondary | ICD-10-CM

## 2024-05-14 DIAGNOSIS — Z955 Presence of coronary angioplasty implant and graft: Secondary | ICD-10-CM | POA: Diagnosis not present

## 2024-05-14 DIAGNOSIS — F32A Depression, unspecified: Secondary | ICD-10-CM | POA: Diagnosis present

## 2024-05-14 DIAGNOSIS — Z79899 Other long term (current) drug therapy: Secondary | ICD-10-CM

## 2024-05-14 DIAGNOSIS — Z8501 Personal history of malignant neoplasm of esophagus: Secondary | ICD-10-CM

## 2024-05-14 DIAGNOSIS — B957 Other staphylococcus as the cause of diseases classified elsewhere: Secondary | ICD-10-CM | POA: Diagnosis not present

## 2024-05-14 DIAGNOSIS — Z8249 Family history of ischemic heart disease and other diseases of the circulatory system: Secondary | ICD-10-CM

## 2024-05-14 DIAGNOSIS — Z7984 Long term (current) use of oral hypoglycemic drugs: Secondary | ICD-10-CM

## 2024-05-14 DIAGNOSIS — E119 Type 2 diabetes mellitus without complications: Secondary | ICD-10-CM

## 2024-05-14 DIAGNOSIS — F419 Anxiety disorder, unspecified: Secondary | ICD-10-CM | POA: Insufficient documentation

## 2024-05-14 DIAGNOSIS — Z8546 Personal history of malignant neoplasm of prostate: Secondary | ICD-10-CM

## 2024-05-14 DIAGNOSIS — Z860100 Personal history of colon polyps, unspecified: Secondary | ICD-10-CM

## 2024-05-14 DIAGNOSIS — I252 Old myocardial infarction: Secondary | ICD-10-CM

## 2024-05-14 DIAGNOSIS — R7881 Bacteremia: Secondary | ICD-10-CM | POA: Diagnosis not present

## 2024-05-14 LAB — CBC WITH DIFFERENTIAL/PLATELET
Abs Immature Granulocytes: 0.53 10*3/uL — ABNORMAL HIGH (ref 0.00–0.07)
Basophils Absolute: 0.1 10*3/uL (ref 0.0–0.1)
Basophils Relative: 0 %
Eosinophils Absolute: 0.1 10*3/uL (ref 0.0–0.5)
Eosinophils Relative: 1 %
HCT: 38.4 % — ABNORMAL LOW (ref 39.0–52.0)
Hemoglobin: 12 g/dL — ABNORMAL LOW (ref 13.0–17.0)
Immature Granulocytes: 4 %
Lymphocytes Relative: 4 %
Lymphs Abs: 0.6 10*3/uL — ABNORMAL LOW (ref 0.7–4.0)
MCH: 28 pg (ref 26.0–34.0)
MCHC: 31.3 g/dL (ref 30.0–36.0)
MCV: 89.5 fL (ref 80.0–100.0)
Monocytes Absolute: 1.5 10*3/uL — ABNORMAL HIGH (ref 0.1–1.0)
Monocytes Relative: 10 %
Neutro Abs: 11.9 10*3/uL — ABNORMAL HIGH (ref 1.7–7.7)
Neutrophils Relative %: 81 %
Platelets: 423 10*3/uL — ABNORMAL HIGH (ref 150–400)
RBC: 4.29 MIL/uL (ref 4.22–5.81)
RDW: 14.6 % (ref 11.5–15.5)
WBC: 14.8 10*3/uL — ABNORMAL HIGH (ref 4.0–10.5)
nRBC: 0 % (ref 0.0–0.2)

## 2024-05-14 LAB — BASIC METABOLIC PANEL WITH GFR
Anion gap: 15 (ref 5–15)
BUN: 19 mg/dL (ref 8–23)
CO2: 25 mmol/L (ref 22–32)
Calcium: 7.9 mg/dL — ABNORMAL LOW (ref 8.9–10.3)
Chloride: 98 mmol/L (ref 98–111)
Creatinine, Ser: 0.81 mg/dL (ref 0.61–1.24)
GFR, Estimated: >60 mL/min (ref >60)
Glucose, Bld: 89 mg/dL (ref 70–99)
Potassium: 4.1 mmol/L (ref 3.5–5.1)
Sodium: 138 mmol/L (ref 135–145)

## 2024-05-14 LAB — BLOOD GAS, VENOUS
Acid-Base Excess: 7.2 mmol/L — ABNORMAL HIGH (ref 0.0–2.0)
Bicarbonate: 32.6 mmol/L — ABNORMAL HIGH (ref 20.0–28.0)
O2 Saturation: 66.7 %
Patient temperature: 37
pCO2, Ven: 48 mmHg (ref 44–60)
pH, Ven: 7.44 — ABNORMAL HIGH (ref 7.25–7.43)
pO2, Ven: 39 mmHg (ref 32–45)

## 2024-05-14 LAB — RESP PANEL BY RT-PCR (RSV, FLU A&B, COVID)  RVPGX2
Influenza A by PCR: NEGATIVE
Influenza B by PCR: NEGATIVE
Resp Syncytial Virus by PCR: NEGATIVE
SARS Coronavirus 2 by RT PCR: NEGATIVE

## 2024-05-14 LAB — GLUCOSE, CAPILLARY
Glucose-Capillary: 183 mg/dL — ABNORMAL HIGH (ref 70–99)
Glucose-Capillary: 168 mg/dL — ABNORMAL HIGH (ref 70–99)

## 2024-05-14 LAB — TROPONIN I (HIGH SENSITIVITY)
Troponin I (High Sensitivity): 11 ng/L (ref <18)
Troponin I (High Sensitivity): 14 ng/L (ref <18)

## 2024-05-14 LAB — LACTIC ACID, PLASMA
Lactic Acid, Venous: 1.6 mmol/L (ref 0.5–1.9)
Lactic Acid, Venous: 1.7 mmol/L (ref 0.5–1.9)

## 2024-05-14 MED ORDER — PANTOPRAZOLE SODIUM 40 MG PO TBEC
40.0000 mg | DELAYED_RELEASE_TABLET | Freq: Two times a day (BID) | ORAL | Status: DC
  Administered 2024-05-15 – 2024-05-19 (×9): 40 mg via ORAL
  Filled 2024-05-14 (×9): qty 1

## 2024-05-14 MED ORDER — ALBUTEROL SULFATE (2.5 MG/3ML) 0.083% IN NEBU
10.0000 mg | INHALATION_SOLUTION | Freq: Once | RESPIRATORY_TRACT | Status: AC
  Administered 2024-05-14: 10 mg via RESPIRATORY_TRACT
  Filled 2024-05-14: qty 12

## 2024-05-14 MED ORDER — ALPRAZOLAM 0.5 MG PO TABS: 0.5000 mg | ORAL_TABLET | Freq: Three times a day (TID) | ORAL | Status: DC | PRN

## 2024-05-14 MED ORDER — ACETAMINOPHEN 650 MG RE SUPP: 650.0000 mg | Freq: Four times a day (QID) | RECTAL | Status: DC | PRN

## 2024-05-14 MED ORDER — ONDANSETRON HCL 4 MG/2ML IJ SOLN: 4.0000 mg | Freq: Four times a day (QID) | INTRAMUSCULAR | Status: DC | PRN

## 2024-05-14 MED ORDER — ALBUTEROL SULFATE (2.5 MG/3ML) 0.083% IN NEBU
2.5000 mg | INHALATION_SOLUTION | RESPIRATORY_TRACT | Status: DC | PRN
  Administered 2024-05-14 – 2024-05-19 (×8): 2.5 mg via RESPIRATORY_TRACT
  Filled 2024-05-14 (×8): qty 3

## 2024-05-14 MED ORDER — LACTATED RINGERS IV SOLN: INTRAVENOUS | Status: DC

## 2024-05-14 MED ORDER — METHYLPREDNISOLONE SODIUM SUCC 40 MG IJ SOLR
40.0000 mg | Freq: Two times a day (BID) | INTRAMUSCULAR | Status: DC
  Administered 2024-05-14 – 2024-05-16 (×4): 40 mg via INTRAVENOUS
  Filled 2024-05-14 (×4): qty 1

## 2024-05-14 MED ORDER — FLUOXETINE HCL 20 MG PO CAPS
20.0000 mg | ORAL_CAPSULE | Freq: Every day | ORAL | Status: DC
Start: 2024-05-15 — End: 2024-05-19
  Administered 2024-05-15 – 2024-05-19 (×5): 20 mg via ORAL
  Filled 2024-05-14 (×5): qty 1

## 2024-05-14 MED ORDER — TRAZODONE HCL 50 MG PO TABS
25.0000 mg | ORAL_TABLET | Freq: Every evening | ORAL | Status: DC | PRN
  Administered 2024-05-15 – 2024-05-17 (×3): 25 mg via ORAL
  Filled 2024-05-14 (×3): qty 1

## 2024-05-14 MED ORDER — MIRTAZAPINE 15 MG PO TABS
45.0000 mg | ORAL_TABLET | Freq: Every day | ORAL | Status: DC
Start: 2024-05-14 — End: 2024-05-19
  Administered 2024-05-14 – 2024-05-18 (×5): 45 mg via ORAL
  Filled 2024-05-14 (×5): qty 3

## 2024-05-14 MED ORDER — LORATADINE 10 MG PO TABS
10.0000 mg | ORAL_TABLET | Freq: Every day | ORAL | Status: DC
  Administered 2024-05-17 – 2024-05-19 (×3): 10 mg via ORAL
  Filled 2024-05-14 (×4): qty 1

## 2024-05-14 MED ORDER — ENOXAPARIN SODIUM 40 MG/0.4ML IJ SOSY
40.0000 mg | PREFILLED_SYRINGE | INTRAMUSCULAR | Status: DC
  Administered 2024-05-14 – 2024-05-18 (×5): 40 mg via SUBCUTANEOUS
  Filled 2024-05-14 (×5): qty 0.4

## 2024-05-14 MED ORDER — SODIUM CHLORIDE 0.9 % IV SOLN
500.0000 mg | Freq: Once | INTRAVENOUS | Status: AC
  Administered 2024-05-14: 500 mg via INTRAVENOUS
  Filled 2024-05-14: qty 5

## 2024-05-14 MED ORDER — SACUBITRIL-VALSARTAN 49-51 MG PO TABS
1.0000 | ORAL_TABLET | Freq: Two times a day (BID) | ORAL | Status: DC
  Administered 2024-05-14 – 2024-05-19 (×10): 1 via ORAL
  Filled 2024-05-14 (×10): qty 1

## 2024-05-14 MED ORDER — ACETAMINOPHEN 325 MG PO TABS: 650.0000 mg | ORAL_TABLET | Freq: Four times a day (QID) | ORAL | Status: DC | PRN

## 2024-05-14 MED ORDER — IPRATROPIUM-ALBUTEROL 0.5-2.5 (3) MG/3ML IN SOLN
3.0000 mL | Freq: Four times a day (QID) | RESPIRATORY_TRACT | Status: DC
  Administered 2024-05-14 – 2024-05-15 (×4): 3 mL via RESPIRATORY_TRACT
  Filled 2024-05-14 (×4): qty 3

## 2024-05-14 MED ORDER — FLUTICASONE PROPIONATE 50 MCG/ACT NA SUSP
2.0000 | Freq: Every day | NASAL | Status: DC
  Administered 2024-05-15 – 2024-05-19 (×5): 2 via NASAL
  Filled 2024-05-14: qty 16

## 2024-05-14 MED ORDER — SODIUM CHLORIDE 0.9 % IV SOLN
1.0000 g | INTRAVENOUS | Status: DC
  Administered 2024-05-15: 1 g via INTRAVENOUS
  Filled 2024-05-14: qty 10

## 2024-05-14 MED ORDER — DAPAGLIFLOZIN PROPANEDIOL 10 MG PO TABS
10.0000 mg | ORAL_TABLET | Freq: Every day | ORAL | Status: DC
  Administered 2024-05-15 – 2024-05-19 (×5): 10 mg via ORAL
  Filled 2024-05-14 (×5): qty 1

## 2024-05-14 MED ORDER — INSULIN ASPART 100 UNIT/ML IJ SOLN
0.0000 [IU] | Freq: Three times a day (TID) | INTRAMUSCULAR | Status: DC
  Administered 2024-05-14: 3 [IU] via SUBCUTANEOUS
  Administered 2024-05-15 (×2): 2 [IU] via SUBCUTANEOUS
  Administered 2024-05-15: 3 [IU] via SUBCUTANEOUS
  Administered 2024-05-16: 2 [IU] via SUBCUTANEOUS
  Administered 2024-05-16: 3 [IU] via SUBCUTANEOUS
  Administered 2024-05-16 – 2024-05-17 (×2): 2 [IU] via SUBCUTANEOUS
  Administered 2024-05-17: 3 [IU] via SUBCUTANEOUS
  Administered 2024-05-18: 2 [IU] via SUBCUTANEOUS
  Filled 2024-05-14 (×10): qty 1

## 2024-05-14 MED ORDER — SODIUM CHLORIDE 0.9 % IV SOLN
1.0000 g | Freq: Once | INTRAVENOUS | Status: AC
  Administered 2024-05-14: 1 g via INTRAVENOUS
  Filled 2024-05-14: qty 10

## 2024-05-14 MED ORDER — SUCRALFATE 1 G PO TABS
1.0000 g | ORAL_TABLET | Freq: Three times a day (TID) | ORAL | Status: DC
Start: 2024-05-15 — End: 2024-05-19
  Administered 2024-05-16 – 2024-05-19 (×7): 1 g via ORAL
  Filled 2024-05-14 (×8): qty 1

## 2024-05-14 MED ORDER — SODIUM CHLORIDE 0.9 % IV BOLUS: 1000.0000 mL | Freq: Once | INTRAVENOUS | Status: DC

## 2024-05-14 MED ORDER — SODIUM CHLORIDE 0.9 % IV BOLUS
500.0000 mL | Freq: Once | INTRAVENOUS | Status: AC
  Administered 2024-05-14: 500 mL via INTRAVENOUS

## 2024-05-14 MED ORDER — INSULIN ASPART 100 UNIT/ML IJ SOLN: 0.0000 [IU] | Freq: Every day | INTRAMUSCULAR | Status: DC

## 2024-05-14 MED ORDER — ONDANSETRON HCL 4 MG PO TABS: 4.0000 mg | ORAL_TABLET | Freq: Four times a day (QID) | ORAL | Status: DC | PRN

## 2024-05-14 MED ORDER — SODIUM CHLORIDE 0.9 % IV SOLN
500.0000 mg | INTRAVENOUS | Status: DC
  Administered 2024-05-15: 500 mg via INTRAVENOUS
  Filled 2024-05-14: qty 5

## 2024-05-14 MED ORDER — ASPIRIN 81 MG PO TBEC
81.0000 mg | DELAYED_RELEASE_TABLET | Freq: Every day | ORAL | Status: DC
  Administered 2024-05-15 – 2024-05-19 (×5): 81 mg via ORAL
  Filled 2024-05-14 (×5): qty 1

## 2024-05-14 NOTE — Sepsis Progress Note (Signed)
 Elink following code sepsis

## 2024-05-14 NOTE — H&P (Signed)
 History and Physical  Leslie Sims [facility] ZOX:[id number] DOB: May 19, 1951 DOA: 05/14/2024  PCP: Valere Gata, MD   Chief Complaint: Cough, shortness of breath  HPI: Leslie Sims is a 73 y.o. male with medical history significant for heart failure with recovered EF, CAD, non-insulin -dependent type 2 diabetes, GERD, COPD chronically on 3 L nasal cannula oxygen  being admitted to the hospital with acute exacerbation of COPD as well as sepsis due to community-acquired pneumonia.  Patient states he has had progressive shortness of breath for the past 7 days, he was recently prescribed oral steroid taper as well as Augmentin  by his pulmonologist on 5/20 but noticed no improvement.  He has had progressive shortness of breath, cough productive of scant sputum.  He denies any chest pain, fevers, nausea, vomiting.  Called EMS this morning, was noted to be saturating in the 80s on 3 L/min.  Received 3 DuoNebs with EMS, as well as 125 mg Solu-Medrol .  After treatment with EMS as well as in the ER, patient reported to be looking more comfortable however he tells me he does not feel much better than he did at home this morning.  Review of Systems: Please see HPI for pertinent positives and negatives. A complete 10 system review of systems are otherwise negative.  Past Medical History:  Diagnosis Date   Allergic rhinitis    Anxiety    Back pain    Cancer (HCC)    prostate   CHF (congestive heart failure) (HCC)    Colon polyps 08/2012   colonoscopy; multiple colon polyps; repeat colonoscopy in one year.   Complication of anesthesia    COPD (chronic obstructive pulmonary disease) (HCC)    a. on home O2.   Coronary artery disease 11/2009   a. late presenting ant MI; b. LHC 100% pLAD s/p PCI/DES, 99% mRCA s/p PCI/DES, EF 35%; c. nuclear stress test 05/13: prior ant/inf infarcts w/o ischemia, EF 43%; d. LHC 02/14: widely patent stents with no other obs dz, EF 40%; e. LHC 11/17: LM nl, mLAD 10%, patent  LAD stent, dLAD 20%, p-mRCA 10%, mRCA 40%, patent RCA stent, EF 35-45%    Depression    Diabetes (HCC)    Dysphagia    Family history of adverse reaction to anesthesia    GERD (gastroesophageal reflux disease)    Headache(784.0)    Helicobacter pylori (H. pylori)    Hemorrhoids    Hernia    inguinal   HFrEF (heart failure with reduced ejection fraction) (HCC)    a. 10/2016 LV gram: EF 35-45%; b. 06/2018 Echo: EF 40-45%. c. 01/2021 Echo: EF 40-45%   Hyperlipidemia    Hypertension    Insomnia    Ischemic cardiomyopathy    a. 10/2016 LV gram: EF 35-45%; b. 06/2018 Echo: EF 40-45%.   Myalgia    Oxygen  dependent    Peptic ulcer    Pulmonary nodules    ST elevation (STEMI) myocardial infarction involving left anterior descending coronary artery (HCC) 11/2009   a. s/p PCI to the LAD   Status post dilation of esophageal narrowing 2000   Thickening of esophagus    Tinea pedis    Wears dentures    partial upper   Past Surgical History:  Procedure Laterality Date    coronary stents     Admission  12/20/2012   COPD exacerbation.  [facility].   CARDIAC CATHETERIZATION     CARDIAC CATHETERIZATION  02/05/2013   [facility]   CARDIAC CATHETERIZATION  09/19/2013   [facility] :  patent stents with no change in anatomy. EF: 40$   CARDIAC CATHETERIZATION Left 11/12/2016   Procedure: Left Heart Cath and Coronary Angiography;  Surgeon: Wenona Hamilton, MD;  Location: [facility] INVASIVE CV LAB;  Service: Cardiovascular;  Laterality: Left;   COLONOSCOPY     COLONOSCOPY WITH PROPOFOL  N/A 05/18/2016   Procedure: COLONOSCOPY WITH PROPOFOL ;  Surgeon: Marnee Sink, MD;  Location: [facility] ENDOSCOPY;  Service: Endoscopy;  Laterality: N/A;   COLONOSCOPY WITH PROPOFOL  N/A 03/19/2020   Procedure: COLONOSCOPY WITH PROPOFOL ;  Surgeon: Selena Daily, MD;  Location: [facility] ENDOSCOPY;  Service: Gastroenterology;  Laterality: N/A;   CORONARY ANGIOPLASTY WITH STENT PLACEMENT  09/19/2009   LAD 3.0 X23 mm Xience DES, RCA: 4.0 X 15 mm  Xience DES   ELECTROMAGNETIC NAVIGATION BROCHOSCOPY N/A 10/27/2017   Procedure: ELECTROMAGNETIC NAVIGATION BRONCHOSCOPY;  Surgeon: Cleve Dale, MD;  Location: [facility] ORS;  Service: Cardiopulmonary;  Laterality: N/A;   ESOPHAGEAL DILATION     ESOPHAGOGASTRODUODENOSCOPY  12/20/2006   ESOPHAGOGASTRODUODENOSCOPY  08/20/2012   ESOPHAGOGASTRODUODENOSCOPY (EGD) WITH PROPOFOL  N/A 04/19/2022   Procedure: ESOPHAGOGASTRODUODENOSCOPY (EGD) WITH PROPOFOL ;  Surgeon: Shane Darling, MD;  Location: [facility] ENDOSCOPY;  Service: Endoscopy;  Laterality: N/A;   ESOPHAGOGASTRODUODENOSCOPY (EGD) WITH PROPOFOL  N/A 04/27/2022   Procedure: ESOPHAGOGASTRODUODENOSCOPY (EGD) WITH PROPOFOL ;  Surgeon: Shane Darling, MD;  Location: [facility] ENDOSCOPY;  Service: Endoscopy;  Laterality: N/A;   ESOPHAGOGASTRODUODENOSCOPY (EGD) WITH PROPOFOL  N/A 11/08/2022   Procedure: ESOPHAGOGASTRODUODENOSCOPY (EGD) WITH PROPOFOL ;  Surgeon: Shane Darling, MD;  Location: [facility] ENDOSCOPY;  Service: Endoscopy;  Laterality: N/A;   EUS N/A 05/20/2022   Procedure: UPPER ENDOSCOPIC ULTRASOUND (EUS) LINEAR;  Surgeon: Rayford Cake, MD;  Location: [facility] ENDOSCOPY;  Service: Gastroenterology;  Laterality: N/A;  [facility]   HERNIA REPAIR  07/20/2012   L inguinal hernia repair   PENILE PROSTHESIS IMPLANT     RIGHT/LEFT HEART CATH AND CORONARY ANGIOGRAPHY Bilateral 04/04/2023   Procedure: RIGHT/LEFT HEART CATH AND CORONARY ANGIOGRAPHY;  Surgeon: Wenona Hamilton, MD;  Location: [facility] INVASIVE CV LAB;  Service: Cardiovascular;  Laterality: Bilateral;   Social History:  reports that he quit smoking about 5 years ago. His smoking use included cigarettes. He started smoking about 47 years ago. He has a 126 pack-year smoking history. He has never used smokeless tobacco. He reports that he does not drink alcohol and does not use drugs.  Allergies  Allergen Reactions   Prozac  [Fluoxetine  Hcl] Shortness Of Breath   Hydroxyzine     Bupropion Other (See  Comments)    "Makes me feel funny" Other reaction(s): Other (See Comments) Unknown/"made me feel real funny" "Makes me feel funny" "Makes me feel funny" Other reaction(s): Other (See Comments) Unknown/"made me feel real funny" "Makes me feel funny"   Effexor Xr [Venlafaxine Hcl Er] Other (See Comments)    "Makes me feel funny"    Family History  Problem Relation Age of Onset   Heart attack Brother        Brother #1   Diabetes Brother    Hypertension Brother        #3   Coronary artery disease Father 51       deceased   Heart attack Father    Diabetes Father    Heart disease Father    COPD Mother 40       deceased   Alcohol abuse Sister        polysubstance abuse   COPD Sister    Lung cancer Sister    Alcohol abuse  Sister        polysubstance abuse   Penile cancer Brother    Diabetes Brother    Prostate cancer Neg Hx    Bladder Cancer Neg Hx    Kidney cancer Neg Hx      Prior to Admission medications   Medication Sig Start Date End Date Taking? Authorizing Provider  albuterol  (VENTOLIN  HFA) 108 (90 Base) MCG/ACT inhaler TAKE 2 PUFFS BY MOUTH EVERY 6 HOURS AS NEEDED FOR WHEEZE OR SHORTNESS OF BREATH 03/06/24   Marc Senior, MD  ALPRAZolam (XANAX) 0.5 MG tablet Take 0.5 mg by mouth 3 (three) times daily. 03/16/23   [provider]  amoxicillin -clavulanate (AUGMENTIN ) 875-125 MG tablet Take 1 tablet by mouth 2 (two) times daily for 7 days. 05/08/24 05/15/24  Marc Senior, MD  aspirin  81 MG tablet Take 81 mg by mouth daily.    [provider]  atorvastatin  (LIPITOR) 80 MG tablet Take 1 tablet (80 mg total) by mouth daily. 03/18/21   Dunn, Elvia Hammans, PA-C  bisoprolol  (ZEBETA ) 5 MG tablet TAKE 1/2 TABLET BY MOUTH DAILY 01/20/24   McLean, Dalton S, MD  budesonide  (PULMICORT ) 0.5 MG/2ML nebulizer solution Take 2 mLs (0.5 mg total) by nebulization in the morning and at bedtime. 06/07/23   Marc Senior, MD  Ensifentrine  (OHTUVAYRE ) 3 MG/2.5ML SUSP Inhale 3  mLs into the lungs 2 (two) times daily. DX:J44.9 08/25/23   Cobb, Mariah Shines, NP  ezetimibe  (ZETIA ) 10 MG tablet Take 1 tablet (10 mg total) by mouth daily. 03/23/24 06/21/24  Darlis Eisenmenger, MD  famotidine  (PEPCID ) 20 MG tablet TAKE 1 TABLET (20 MG TOTAL) BY MOUTH 2 (TWO) TIMES DAILY. FOR BREAKTHROUGH REFLUX 04/16/24   Marc Senior, MD  FARXIGA  10 MG TABS tablet TAKE 1 TABLET BY MOUTH DAILY BEFORE BREAKFAST. 11/08/23   Darlis Eisenmenger, MD  FLUoxetine  (PROZAC ) 20 MG tablet Take 20 mg by mouth daily. 02/04/24   [provider]  fluticasone  (FLONASE ) 50 MCG/ACT nasal spray Place 2 sprays into both nostrils daily. 06/08/21   [provider]  furosemide  (LASIX ) 20 MG tablet Take 1 tablet (20 mg total) by mouth as needed. 06/27/23   Darlis Eisenmenger, MD  ipratropium-albuterol  (DUONEB) 0.5-2.5 (3) MG/3ML SOLN Take 3 mLs by nebulization every 6 (six) hours as needed. 03/06/24   Marc Senior, MD  ketoconazole  (NIZORAL ) 2 % cream Apply topically 2 (two) times daily. 09/10/22   [provider]  loratadine  (CLARITIN ) 10 MG tablet Take 10 mg by mouth daily. 06/08/21   [provider]  methylPREDNISolone  (MEDROL  DOSEPAK) 4 MG TBPK tablet Take as directed on the package, this is a taper pack 05/09/24   Marc Senior, MD  mirtazapine  (REMERON ) 45 MG tablet Take 45 mg by mouth at bedtime. 06/29/22   [provider]  Multiple Vitamin (MULTIVITAMIN) capsule Take 1 capsule by mouth daily.    [provider]  nitroGLYCERIN  (NITROSTAT ) 0.4 MG SL tablet Place 1 tablet (0.4 mg total) under the tongue every 5 (five) minutes as needed. 07/28/22   Furth, Cadence H, PA-C  OXYGEN  Inhale 3 L into the lungs daily.    [provider]  pantoprazole  (PROTONIX ) 40 MG tablet Take 40 mg by mouth 2 (two) times daily before a meal. 01/26/24 01/25/25  [provider]  sacubitril -valsartan  (ENTRESTO ) 49-51 MG Take 1 tablet by mouth 2 (two) times daily. 03/20/24   Darlis Eisenmenger, MD  Spacer/Aero-Holding Chambers (AEROCHAMBER MV)  inhaler Use as instructed 05/04/21   Kent Pear, NP  spironolactone  (ALDACTONE ) 25 MG tablet Take 1 tablet (25 mg total) by mouth daily. 11/29/23 03/19/24  Lauralee Poll, MD  sucralfate  (CARAFATE ) 1 g tablet Take 1 tablet (1 g total) by mouth 3 (three) times daily with meals. 1 hour before meals and medications 08/05/23   Cobb, Mariah Shines, NP  prochlorperazine  (COMPAZINE ) 10 MG tablet Take 1 tablet (10 mg total) by mouth every 6 (six) hours as needed (Nausea or vomiting). 05/31/22 08/29/22  Timmy Forbes, MD    Physical Exam: BP 128/69   Pulse (!) 106   Temp (!) 95.4 F (35.2 C) (Oral)   Resp (!) 21   Ht 5\' 10"  (1.778 m)   Wt 86 kg   SpO2 90%   BMI 27.20 kg/m  General:  Alert, oriented, calm, in no acute distress, very minimal tachypnea, able to speak in full sentences, no cough.  Currently wearing 3 L nasal cannula oxygen . Cardiovascular: RRR, no murmurs or rubs, no peripheral edema  Respiratory: Breath sounds are globally distant, he has significant wheezing in all lung fields, no crackles, mild tachypnea.  No retractions, rhonchi or stridor. Abdomen: soft, nontender, nondistended, normal bowel tones heard  Musculoskeletal: no joint effusions, normal range of motion  Psychiatric: appropriate affect, normal speech  Neurologic: extraocular muscles intact, clear speech, moving all extremities with intact sensorium         Labs on Admission:  Basic Metabolic Panel: Recent Labs  Lab 05/14/24 0950  NA 138  K 4.1  CL 98  CO2 25  GLUCOSE 89  BUN 19  CREATININE 0.81  CALCIUM  7.9*   Liver Function Tests: No results for input(s): "AST", "ALT", "ALKPHOS", "BILITOT", "PROT", "ALBUMIN" in the last 168 hours. No results for input(s): "LIPASE", "AMYLASE" in the last 168 hours. No results for input(s): "AMMONIA" in the last 168 hours. CBC: Recent Labs  Lab 05/14/24 0950  WBC 14.8*  NEUTROABS 11.9*  HGB 12.0*  HCT 38.4*   MCV 89.5  PLT 423*   Cardiac Enzymes: No results for input(s): "CKTOTAL", "CKMB", "CKMBINDEX", "TROPONINI" in the last 168 hours. BNP (last 3 results) Recent Labs    06/27/23 1649 08/10/23 1220 03/19/24 1518  BNP 25.0 25.0 58.8    ProBNP (last 3 results) No results for input(s): "PROBNP" in the last 8760 hours.  CBG: No results for input(s): "GLUCAP" in the last 168 hours.  Radiological Exams on Admission: DG Chest Portable 1 View Result Date: 05/14/2024 CLINICAL DATA:  SOB, wheezing EXAM: PORTABLE CHEST - 1 VIEW COMPARISON:  05/08/2024. FINDINGS: The heart size and mediastinal contours are within normal limits. Lungs are hyperinflated suggesting COPD. Alveolar consolidation left upper lungs consistent with pneumonia. No pneumothorax or pleural effusion. Thoracic degenerative disc disease. IMPRESSION: Left upper lung pneumonia. Electronically Signed   By: Sydell Eva M.D.   On: 05/14/2024 10:30   Assessment/Plan Leslie Sims is a 73 y.o. male with medical history significant for heart failure with recovered EF, CAD, non-insulin -dependent type 2 diabetes, GERD, COPD chronically on 3 L nasal cannula oxygen  being admitted to the hospital with acute exacerbation of COPD as well as sepsis due to community-acquired pneumonia.   Sepsis-meeting criteria with tachycardia, leukocytosis, tachypnea.  Source is left upper lobe community-acquired pneumonia.  He is hemodynamically stable, and lactic acid level is pending. -Inpatient admission -Follow-up peripheral blood cultures, note negative viral respiratory panel -Empiric IV azithromycin  and IV Rocephin  -Check lactate, and trend -Due to  persistent tachycardia, will give 500 cc bolus (reduced volume due to history of heart failure)  Acute exacerbation of COPD-with increased dyspnea, hypoxia, and cough.  Likely instigated from his community-acquired pneumonia which is being treated as above. -Continue supplemental oxygen , wean as  tolerated, he is on 3 L nasal cannula at baseline -Scheduled DuoNebs, as needed albuterol  inhaler -Incentive spirometer and flutter valve -IV Solu-Medrol  40 mg twice daily  Heart failure with preserved EF-recent echo shows recovery of his ejection fraction, currently patient appears euvolemic without evidence of acute heart failure exacerbation -Entresto , Farxiga  -Given small fluid bolus as above, will hold off on maintenance IV fluids for the time being  Type 2 diabetes-historically well-controlled -Carb modified heart healthy diet -Moderate sliding scale insulin  while hospitalized  GERD-Protonix  twice daily, with sucralfate   Anxiety and depression-Prozac , Remeron  nightly, Xanax 3 times daily as needed  DVT prophylaxis: Lovenox      Code Status: Full Code  Consults called: None  Admission status: The appropriate patient status for this patient is INPATIENT. Inpatient status is judged to be reasonable and necessary in order to provide the required intensity of service to ensure the patient's safety. The patient's presenting symptoms, physical exam findings, and initial radiographic and laboratory data in the context of their chronic comorbidities is felt to place them at high risk for further clinical deterioration. Furthermore, it is not anticipated that the patient will be medically stable for discharge from the hospital within 2 midnights of admission.    I certify that at the point of admission it is my clinical judgment that the patient will require inpatient hospital care spanning beyond 2 midnights from the point of admission due to high intensity of service, high risk for further deterioration and high frequency of surveillance required  Time spent: 55 minutes  Leslie Sims Rickey Charm MD [facility] Pager [phone]  If 7PM-7AM, please contact night-coverage [email or web address] Password [facility]  05/14/2024, 12:40 PM

## 2024-05-14 NOTE — Progress Notes (Deleted)
 Tennis shoes and personal oxygen  tank

## 2024-05-14 NOTE — Consult Note (Signed)
 CODE SEPSIS - PHARMACY COMMUNICATION  **Broad Spectrum Antibiotics should be administered within 1 hour of Sepsis diagnosis**  Time Code Sepsis Called/Page Received: 05/14/2024 @ 1200  Antibiotics Ordered:  Ceftriaxone  1g IV x 1 Zithromax  500 mg IV x 1  Time of 1st antibiotic administration: 05/14/2024 @ 1027  Additional action taken by pharmacy: NA  If necessary, Name of Provider/Nurse Contacted: NA  Craven Do, PharmD Pharmacy Resident  05/14/2024 12:15 PM

## 2024-05-14 NOTE — ED Provider Notes (Signed)
 [facility] Provider Note    Event Date/Time   First MD Initiated Contact with Patient 05/14/24 [phone]     (approximate)   History   Shortness of Breath   HPI Leslie Sims is a 73 y.o. male with history of COPD, chronic hypoxic respiratory failure chronically on 2 L presenting today for shortness of breath.  He states worsening shortness of breath over the past couple of days.  Saw his pulmonologist 6 days ago and was started on antibiotics and steroids with no improvement.  EMS picked him up today and found him to be hypoxic in the low 80s on his 2 L.  Received 3 DuoNeb treatments and Solu-Medrol  and route.  Also notes some mild left-sided chest pain.  Dry cough.  No congestion.  Denies any leg pain or leg swelling.  No nausea or vomiting.  Chart review: Patient seen in the ED on 5/17 and pulmonology on 5/20 for same symptoms.  Has been on outpatient antibiotics with no improvement in symptoms.  Patient was on azithromycin  and prednisone .     Physical Exam   Triage Vital Signs: ED Triage Vitals  Encounter Vitals Group     BP 05/14/24 0948 (!) 151/77     Systolic BP Percentile --      Diastolic BP Percentile --      Pulse Rate 05/14/24 0948 96     Resp 05/14/24 0948 20     Temp 05/14/24 0945 97.8 F (36.6 C)     Temp Source 05/14/24 0945 Oral     SpO2 05/14/24 0948 95 %     Weight 05/14/24 0946 189 lb 9.5 oz (86 kg)     Height 05/14/24 0946 5\' 10"  (1.778 m)     Head Circumference --      Peak Flow --      Pain Score 05/14/24 0946 8     Pain Loc --      Pain Education --      Exclude from Growth Chart --     Most recent vital signs: Vitals:   05/14/24 0945 05/14/24 0948  BP:  (!) 151/77  Pulse:  96  Resp:  20  Temp: 97.8 F (36.6 C) (!) 95.4 F (35.2 C)  SpO2:  95%   Physical Exam: I have reviewed the vital signs and nursing notes. General: Awake, alert, no acute distress.  Nontoxic appearing. Head:  Atraumatic, normocephalic.    ENT:  EOM intact, PERRL. Oral mucosa is pink and moist with no lesions. Neck: Neck is supple with full range of motion, No meningeal signs. Cardiovascular:  RRR, No murmurs. Peripheral pulses palpable and equal bilaterally. Respiratory:  Symmetrical chest wall expansion.  Tachypnea with significant inspiratory and expiratory wheezing throughout.  No significant accessory muscle usage. Musculoskeletal:  No cyanosis or edema. Moving extremities with full ROM Abdomen:  Soft, nontender, nondistended. Neuro:  GCS 15, moving all four extremities, interacting appropriately. Speech clear. Psych:  Calm, appropriate.   Skin:  Warm, dry, no rash.    ED Results / Procedures / Treatments   Labs (all labs ordered are listed, but only abnormal results are displayed) Labs Reviewed  CBC WITH DIFFERENTIAL/PLATELET - Abnormal; Notable for the following components:      Result Value   WBC 14.8 (*)    Hemoglobin 12.0 (*)    HCT 38.4 (*)    Platelets 423 (*)    Neutro Abs 11.9 (*)    Lymphs Abs 0.6 (*)  Monocytes Absolute 1.5 (*)    Abs Immature Granulocytes 0.53 (*)    All other components within normal limits  BASIC METABOLIC PANEL WITH GFR - Abnormal; Notable for the following components:   Calcium  7.9 (*)    All other components within normal limits  BLOOD GAS, VENOUS - Abnormal; Notable for the following components:   pH, Ven 7.44 (*)    Bicarbonate 32.6 (*)    Acid-Base Excess 7.2 (*)    All other components within normal limits  RESP PANEL BY RT-PCR (RSV, FLU A&B, COVID)  [id number]  TROPONIN I (HIGH SENSITIVITY)     EKG My EKG interpretation: Rate of 93, normal sinus rhythm, normal axis, normal intervals.  No acute ST elevations or depressions   RADIOLOGY Independently interpreted chest x-ray with evidence of left upper lobe pneumonia   PROCEDURES:  Critical Care performed: Yes, see critical care procedure note(s)  .Critical Care  Performed by: Kandee Orion, MD Authorized  by: Kandee Orion, MD   Critical care provider statement:    Critical care time (minutes):  30   Critical care was necessary to treat or prevent imminent or life-threatening deterioration of the following conditions:  Respiratory failure   Critical care was time spent personally by me on the following activities:  Development of treatment plan with patient or surrogate, discussions with consultants, evaluation of patient's response to treatment, examination of patient, ordering and review of laboratory studies, ordering and review of radiographic studies, ordering and performing treatments and interventions, pulse oximetry, re-evaluation of patient's condition and review of old charts   I assumed direction of critical care for this patient from another provider in my specialty: no     Care discussed with: admitting provider      MEDICATIONS ORDERED IN ED: Medications  azithromycin  (ZITHROMAX ) 500 mg in sodium chloride  0.9 % 250 mL IVPB (500 mg Intravenous New Bag/Given 05/14/24 1058)  albuterol  (PROVENTIL ) (2.5 MG/3ML) 0.083% nebulizer solution 10 mg (10 mg Nebulization Given 05/14/24 1020)  cefTRIAXone  (ROCEPHIN ) 1 g in sodium chloride  0.9 % 100 mL IVPB (0 g Intravenous Stopped 05/14/24 1055)     IMPRESSION / MDM / ASSESSMENT AND PLAN / ED COURSE  I reviewed the triage vital signs and the nursing notes.                              Differential diagnosis includes, but is not limited to, COPD exacerbation, pneumonia, COVID, flu, RSV  Patient's presentation is most consistent with acute presentation with potential threat to life or bodily function.  Patient is a 73 year old male presenting today for worsening shortness of breath from his baseline over the past several days.  Was found to be hypoxic with EMS on his 2 L and placed up to 4 L with improvement.  Received 3 DuoNeb treatments and Solu-Medrol  with EMS.  On arrival was still having significant wheezing.  Given 10 mg albuterol   treatment.  Concern for pneumonia with his COPD and started on IV antibiotics with ceftriaxone  azithromycin .  Slight leukocytosis which may be either from infection or the steroids that he has been on.  Chest x-ray confirms left upper lobe pneumonia.  Negative for COVID, flu, RSV.  Breathing slightly improved following albuterol  treatment.  Will admit to hospitalist for ongoing treatment of pneumonia with COPD exacerbation and hypoxia.  The patient is on the cardiac monitor to evaluate for evidence of arrhythmia and/or significant heart rate  changes.     FINAL CLINICAL IMPRESSION(S) / ED DIAGNOSES   Final diagnoses:  Acute hypoxic respiratory failure (HCC)  Pneumonia of left upper lobe due to infectious organism  COPD exacerbation (HCC)     Rx / DC Orders   ED Discharge Orders     None        Note:  This document was prepared using Dragon voice recognition software and may include unintentional dictation errors.   Kandee Orion, MD 05/14/24 1131

## 2024-05-14 NOTE — ED Triage Notes (Signed)
 Pt here via [facility] from home with c/o of SOB, pt D/C'ed around 5/20 for the same. Pt wears 3L/min chronically-EMS states pt was in the 80's on 3L/min at home. Pt denies fevers.   3 Duonebs by EMS PTA 125mg  Solumedrol IV by EMS PTA   Pt sent home with ABX with no improvement.   Pt has HX of COPD

## 2024-05-15 DIAGNOSIS — J9601 Acute respiratory failure with hypoxia: Secondary | ICD-10-CM | POA: Diagnosis not present

## 2024-05-15 DIAGNOSIS — J189 Pneumonia, unspecified organism: Secondary | ICD-10-CM | POA: Diagnosis not present

## 2024-05-15 DIAGNOSIS — J9602 Acute respiratory failure with hypercapnia: Secondary | ICD-10-CM

## 2024-05-15 DIAGNOSIS — F419 Anxiety disorder, unspecified: Secondary | ICD-10-CM

## 2024-05-15 DIAGNOSIS — F32A Depression, unspecified: Secondary | ICD-10-CM | POA: Insufficient documentation

## 2024-05-15 DIAGNOSIS — J441 Chronic obstructive pulmonary disease with (acute) exacerbation: Secondary | ICD-10-CM

## 2024-05-15 DIAGNOSIS — R197 Diarrhea, unspecified: Secondary | ICD-10-CM

## 2024-05-15 DIAGNOSIS — E119 Type 2 diabetes mellitus without complications: Secondary | ICD-10-CM

## 2024-05-15 DIAGNOSIS — K2100 Gastro-esophageal reflux disease with esophagitis, without bleeding: Secondary | ICD-10-CM

## 2024-05-15 DIAGNOSIS — I5042 Chronic combined systolic (congestive) and diastolic (congestive) heart failure: Secondary | ICD-10-CM

## 2024-05-15 LAB — GASTROINTESTINAL PANEL BY PCR, STOOL (REPLACES STOOL CULTURE)

## 2024-05-15 LAB — CBC
HCT: 34.8 % — ABNORMAL LOW (ref 39.0–52.0)
Hemoglobin: 11 g/dL — ABNORMAL LOW (ref 13.0–17.0)
MCH: 27.6 pg (ref 26.0–34.0)
MCHC: 31.6 g/dL (ref 30.0–36.0)
MCV: 87.2 fL (ref 80.0–100.0)
Platelets: 340 10*3/uL (ref 150–400)
RBC: 3.99 MIL/uL — ABNORMAL LOW (ref 4.22–5.81)
RDW: 14.4 % (ref 11.5–15.5)
WBC: 11.2 10*3/uL — ABNORMAL HIGH (ref 4.0–10.5)
nRBC: 0 % (ref 0.0–0.2)

## 2024-05-15 LAB — GLUCOSE, CAPILLARY
Glucose-Capillary: 161 mg/dL — ABNORMAL HIGH (ref 70–99)
Glucose-Capillary: 133 mg/dL — ABNORMAL HIGH (ref 70–99)
Glucose-Capillary: 143 mg/dL — ABNORMAL HIGH (ref 70–99)
Glucose-Capillary: 151 mg/dL — ABNORMAL HIGH (ref 70–99)

## 2024-05-15 LAB — C DIFFICILE QUICK SCREEN W PCR REFLEX
C Diff antigen: NEGATIVE
C Diff interpretation: NOT DETECTED
C Diff toxin: NEGATIVE

## 2024-05-15 LAB — BASIC METABOLIC PANEL WITH GFR
Anion gap: 13 (ref 5–15)
BUN: 19 mg/dL (ref 8–23)
CO2: 26 mmol/L (ref 22–32)
Calcium: 8.6 mg/dL — ABNORMAL LOW (ref 8.9–10.3)
Chloride: 101 mmol/L (ref 98–111)
Creatinine, Ser: 0.74 mg/dL (ref 0.61–1.24)
GFR, Estimated: >60 mL/min (ref >60)
Glucose, Bld: 168 mg/dL — ABNORMAL HIGH (ref 70–99)
Potassium: 4 mmol/L (ref 3.5–5.1)
Sodium: 140 mmol/L (ref 135–145)

## 2024-05-15 LAB — HIV ANTIBODY (ROUTINE TESTING W REFLEX): HIV Screen 4th Generation wRfx: NONREACTIVE

## 2024-05-15 LAB — HEMOGLOBIN A1C
Hgb A1c MFr Bld: 6.3 % — ABNORMAL HIGH (ref 4.8–5.6)
Mean Plasma Glucose: 134.11 mg/dL

## 2024-05-15 MED ORDER — LOPERAMIDE HCL 2 MG PO CAPS: 2.0000 mg | ORAL_CAPSULE | Freq: Three times a day (TID) | ORAL | Status: DC | PRN

## 2024-05-15 MED ORDER — AZITHROMYCIN 500 MG PO TABS
500.0000 mg | ORAL_TABLET | Freq: Every day | ORAL | Status: DC
  Administered 2024-05-16 – 2024-05-17 (×2): 500 mg via ORAL
  Filled 2024-05-15 (×2): qty 1

## 2024-05-15 MED ORDER — EZETIMIBE 10 MG PO TABS
10.0000 mg | ORAL_TABLET | Freq: Every day | ORAL | Status: DC
  Administered 2024-05-15 – 2024-05-19 (×5): 10 mg via ORAL
  Filled 2024-05-15 (×5): qty 1

## 2024-05-15 MED ORDER — SODIUM CHLORIDE 0.9 % IV SOLN
2.0000 g | INTRAVENOUS | Status: DC
  Administered 2024-05-16 – 2024-05-17 (×2): 2 g via INTRAVENOUS
  Filled 2024-05-15 (×2): qty 20

## 2024-05-15 MED ORDER — IPRATROPIUM-ALBUTEROL 0.5-2.5 (3) MG/3ML IN SOLN
3.0000 mL | Freq: Three times a day (TID) | RESPIRATORY_TRACT | Status: DC
  Administered 2024-05-15 – 2024-05-19 (×11): 3 mL via RESPIRATORY_TRACT
  Filled 2024-05-15 (×11): qty 3

## 2024-05-15 MED ORDER — ATORVASTATIN CALCIUM 20 MG PO TABS
80.0000 mg | ORAL_TABLET | Freq: Every day | ORAL | Status: DC
  Administered 2024-05-15 – 2024-05-19 (×5): 80 mg via ORAL
  Filled 2024-05-15 (×5): qty 4

## 2024-05-15 NOTE — Plan of Care (Signed)
  Problem: Education: Goal: Ability to describe self-care measures that may prevent or decrease complications (Diabetes Survival Skills Education) will improve Outcome: Progressing Goal: Individualized Educational Video(s) Outcome: Progressing   Problem: Coping: Goal: Ability to adjust to condition or change in health will improve Outcome: Progressing   Problem: Fluid Volume: Goal: Ability to maintain a balanced intake and output will improve Outcome: Progressing   Problem: Health Behavior/Discharge Planning: Goal: Ability to identify and utilize available resources and services will improve Outcome: Progressing Goal: Ability to manage health-related needs will improve Outcome: Progressing   Problem: Metabolic: Goal: Ability to maintain appropriate glucose levels will improve Outcome: Progressing   Problem: Nutritional: Goal: Maintenance of adequate nutrition will improve Outcome: Progressing Goal: Progress toward achieving an optimal weight will improve Outcome: Progressing   Problem: Skin Integrity: Goal: Risk for impaired skin integrity will decrease Outcome: Progressing   Problem: Education: Goal: Knowledge of General Education information will improve Description: Including pain rating scale, medication(s)/side effects and non-pharmacologic comfort measures Outcome: Progressing   Problem: Health Behavior/Discharge Planning: Goal: Ability to manage health-related needs will improve Outcome: Progressing   Problem: Clinical Measurements: Goal: Ability to maintain clinical measurements within normal limits will improve Outcome: Progressing Goal: Will remain free from infection Outcome: Progressing Goal: Diagnostic test results will improve Outcome: Progressing Goal: Respiratory complications will improve Outcome: Progressing Goal: Cardiovascular complication will be avoided Outcome: Progressing   Problem: Activity: Goal: Risk for activity intolerance will  decrease Outcome: Progressing   Problem: Nutrition: Goal: Adequate nutrition will be maintained Outcome: Progressing   Problem: Elimination: Goal: Will not experience complications related to bowel motility Outcome: Progressing Goal: Will not experience complications related to urinary retention Outcome: Progressing   Problem: Pain Managment: Goal: General experience of comfort will improve and/or be controlled Outcome: Progressing   Problem: Skin Integrity: Goal: Risk for impaired skin integrity will decrease Outcome: Progressing

## 2024-05-15 NOTE — Progress Notes (Signed)
 PHARMACIST - PHYSICIAN COMMUNICATION DR: Clelia Current  CONCERNING: Antibiotic IV to Oral Route Change Policy  RECOMMENDATION: This patient is receiving azithromycin  by the intravenous route. Based on criteria approved by the Pharmacy and Therapeutics Committee, the antibiotic(s) is/are being converted to the equivalent oral dose form(s).  DESCRIPTION: These criteria include: Patient being treated for a respiratory tract infection, urinary tract infection, cellulitis or clostridium difficile associated diarrhea if on metronidazole The patient is not neutropenic and does not exhibit a GI malabsorption state The patient is eating (either orally or via tube) and/or has been taking other orally administered medications for a least 24 hours The patient is improving clinically and has a Tmax < 100.5  If you have questions about this conversion, please contact the Pharmacy Department.

## 2024-05-15 NOTE — Hospital Course (Addendum)
 73 y.o. male with medical history significant for heart failure with recovered EF, CAD, non-insulin -dependent type 2 diabetes, GERD, COPD chronically on 3 L nasal cannula oxygen  being admitted to the hospital with acute exacerbation of COPD as well as sepsis due to community-acquired pneumonia.  Patient states he has had progressive shortness of breath for the past 7 days, he was recently prescribed oral steroid taper as well as Augmentin  by his pulmonologist on 5/20 but noticed no improvement.  He has had progressive shortness of breath, cough productive of scant sputum.  He denies any chest pain, fevers, nausea, vomiting.  Called EMS this morning, was noted to be saturating in the 80s on 3 L/min.  Received 3 DuoNebs with EMS, as well as 125 mg Solu-Medrol .  After treatment with EMS as well as in the ER, patient reported to be looking more comfortable however he tells me he does not feel much better than he did at home this morning.   5/27.  Patient still not feeling well.  Still with shortness of breath and cough and wheezing.  Admitted with sepsis secondary to pneumonia and COPD exacerbation.  5/28: Still not feeling well and becoming short of breath with minor exertion.  Wheezing seems improving.   5/29: Still feeling short of breath and wheezing.  5/30: Hemodynamically stable and feeling much improved.  Blood cultures started turning positive after more than 3 years, 1 set with staph epi and a corynebacterium.  Likely a contaminant, case was discussed with infectious disease and they were recommending repeat cultures so discharge got postponed.  5/31: Patient remained hemodynamically stable on baseline oxygen  use.  No more wheezing.  Wants to go home.  Blood cultures were repeated yesterday and negative on preliminary results.  Procalcitonin was negative, repeat respiratory viral panel with rhinovirus.  Fungitell labs are sent and pending.  Patient is being discharged on 3 more days of Augmentin  and  prednisone . He need to have close follow-up with primary care provider and pulmonologist and they can follow-up the results of these labs.  Patient also needed repeat chest imaging in 4 to 6 weeks to see the resolution of left upper lobe pneumonia.  He will continue on current medications and need to have a close follow-up with his providers for further assistance.

## 2024-05-15 NOTE — Assessment & Plan Note (Signed)
 Last EF 50%.  Improved ejection fraction from previous.  -Continue Entresto , Farxiga .  No beta-blocker with bronchospasm.

## 2024-05-15 NOTE — Assessment & Plan Note (Signed)
 Continue psychiatric medications.

## 2024-05-15 NOTE — Assessment & Plan Note (Signed)
 On Protonix and Carafate

## 2024-05-15 NOTE — Assessment & Plan Note (Signed)
 Switching Solu-Medrol  with prednisone  - Continue nebulizer treatments.

## 2024-05-15 NOTE — Assessment & Plan Note (Addendum)
 Present on admission with tachycardia, hypothermia (1 temperature on 5/26), leukocytosis and tachypnea with left upper lobe pneumonia.  No problems swallowing.  On Zithromax  and Rocephin .

## 2024-05-15 NOTE — Assessment & Plan Note (Signed)
 Patient had 3 episodes of diarrhea yesterday 1 which was uncontrollable that he needed help being cleaned up.  Stool studies are negative.  As needed Imodium .

## 2024-05-15 NOTE — Assessment & Plan Note (Signed)
 Sugars likely little bit higher with steroids.  Hemoglobin A1c 6.3.  Patient on sliding scale insulin  and also on Farxiga .

## 2024-05-15 NOTE — Assessment & Plan Note (Signed)
 Patient had increased oxygen  requirements up to 5 L but now back to his baseline oxygen  level of 3 L.  Pulse ox of 89% on 3 L.

## 2024-05-15 NOTE — Progress Notes (Signed)
 Progress Note   Patient: Leslie Sims DOB: 04-12-1951 DOA: 05/14/2024     1 DOS: the patient was seen and examined on 05/15/2024   Brief hospital course: 73 y.o. male with medical history significant for heart failure with recovered EF, CAD, non-insulin -dependent type 2 diabetes, GERD, COPD chronically on 3 L nasal cannula oxygen  being admitted to the hospital with acute exacerbation of COPD as well as sepsis due to community-acquired pneumonia.  Patient states he has had progressive shortness of breath for the past 7 days, he was recently prescribed oral steroid taper as well as Augmentin  by his pulmonologist on 5/20 but noticed no improvement.  He has had progressive shortness of breath, cough productive of scant sputum.  He denies any chest pain, fevers, nausea, vomiting.  Called EMS this morning, was noted to be saturating in the 80s on 3 L/min.  Received 3 DuoNebs with EMS, as well as 125 mg Solu-Medrol .  After treatment with EMS as well as in the ER, patient reported to be looking more comfortable however he tells me he does not feel much better than he did at home this morning.   5/27.  Patient still not feeling well.  Still with shortness of breath and cough and wheezing.  Admitted with sepsis secondary to pneumonia and COPD exacerbation.  Assessment and Plan: * Sepsis due to pneumonia [facility]) Present on admission with tachycardia, hypothermia (1 temperature on 5/26), leukocytosis and tachypnea with left upper lobe pneumonia.  No problems swallowing.  On Zithromax  and Rocephin .  COPD with acute exacerbation (HCC) Continue Solu-Medrol  with bronchospasm and wheezing.  Continue nebulizer treatments.  Diarrhea Patient had 3 episodes of diarrhea yesterday 1 which was uncontrollable that he needed help being cleaned up.  Stool studies are negative.  As needed Imodium.  Acute respiratory failure with hypoxia and hypercapnia (HCC) Patient had increased oxygen  requirements up  to 5 L but now back to his baseline oxygen  level of 3 L.  Pulse ox of 89% on 3 L.  Anxiety and depression Continue psychiatric medications  Chronic combined systolic and diastolic congestive heart failure (HCC) Last EF 50%.  Improved ejection fraction from previous.  Continue Entresto , Farxiga .  No beta-blocker with bronchospasm.  Controlled type 2 diabetes mellitus without complication, without long-term current use of insulin  (HCC) Sugars likely little bit higher with steroids.  Hemoglobin A1c 6.3.  Patient on sliding scale insulin  and also on Farxiga .  GERD (gastroesophageal reflux disease) On Protonix  and Carafate         Subjective: Patient still not feeling well.  Complains of cough and wheeze and shortness of breath.  Had 3 episodes of diarrhea last night 1 was uncontrollable where he needed staff to clean him up.  Admitted with sepsis and pneumonia.  Physical Exam: Vitals:   05/14/24 1951 05/15/24 0422 05/15/24 0437 05/15/24 0800  BP: 118/76 114/72  (!) 120/58  Pulse: (!) 105 73 70 88  Resp: 16 16  20   Temp: 97.6 F (36.4 C) 97.6 F (36.4 C)  97.9 F (36.6 C)  TempSrc: Oral     SpO2: 91% (!) 87% 92% (!) 89%  Weight:      Height:       Physical Exam HENT:     Head: Normocephalic.  Eyes:     General: Lids are normal.     Conjunctiva/sclera: Conjunctivae normal.  Cardiovascular:     Rate and Rhythm: Normal rate and regular rhythm.     Heart sounds: Normal heart sounds,  S1 normal and S2 normal.  Pulmonary:     Breath sounds: Examination of the right-middle field reveals decreased breath sounds and wheezing. Examination of the left-middle field reveals decreased breath sounds and wheezing. Examination of the right-lower field reveals decreased breath sounds and rhonchi. Examination of the left-lower field reveals decreased breath sounds and rhonchi. Decreased breath sounds, wheezing and rhonchi present. No rales.  Abdominal:     Palpations: Abdomen is soft.      Tenderness: There is no abdominal tenderness.  Musculoskeletal:     Right lower leg: No swelling.     Left lower leg: No swelling.  Skin:    General: Skin is warm.     Findings: No rash.  Neurological:     Mental Status: He is alert and oriented to person, place, and time.     Data Reviewed: White blood cell 11.2, hemoglobin 11.0, platelet count 340, creatinine 0.74, electrolytes normal range, sugar 168, blood cultures negative for less than 24 hours, stool studies negative, chest x-ray left upper lung pneumonia  Family Communication: Deferred  Disposition: Status is: Inpatient Remains inpatient appropriate because: Continue IV antibiotics and IV steroids today for bronchospasm and wheezing.  Reassess tomorrow.  Planned Discharge Destination: Home    Time spent: 28 minutes  Author: Verla Glaze, MD 05/15/2024 2:58 PM  For on call review [email or web address].

## 2024-05-16 DIAGNOSIS — J441 Chronic obstructive pulmonary disease with (acute) exacerbation: Secondary | ICD-10-CM | POA: Diagnosis not present

## 2024-05-16 DIAGNOSIS — J189 Pneumonia, unspecified organism: Secondary | ICD-10-CM | POA: Diagnosis not present

## 2024-05-16 DIAGNOSIS — K219 Gastro-esophageal reflux disease without esophagitis: Secondary | ICD-10-CM

## 2024-05-16 DIAGNOSIS — R197 Diarrhea, unspecified: Secondary | ICD-10-CM | POA: Diagnosis not present

## 2024-05-16 DIAGNOSIS — J9601 Acute respiratory failure with hypoxia: Secondary | ICD-10-CM | POA: Diagnosis not present

## 2024-05-16 LAB — GLUCOSE, CAPILLARY
Glucose-Capillary: 132 mg/dL — ABNORMAL HIGH (ref 70–99)
Glucose-Capillary: 151 mg/dL — ABNORMAL HIGH (ref 70–99)
Glucose-Capillary: 141 mg/dL — ABNORMAL HIGH (ref 70–99)
Glucose-Capillary: 155 mg/dL — ABNORMAL HIGH (ref 70–99)

## 2024-05-16 MED ORDER — PREDNISONE 50 MG PO TABS
50.0000 mg | ORAL_TABLET | Freq: Every day | ORAL | Status: DC
  Administered 2024-05-17: 50 mg via ORAL
  Filled 2024-05-16: qty 1

## 2024-05-16 NOTE — Plan of Care (Signed)

## 2024-05-16 NOTE — Progress Notes (Signed)
 Progress Note   Patient: Leslie Sims ZOX:[id number] DOB: 1951-12-19 DOA: 05/14/2024     2 DOS: the patient was seen and examined on 05/16/2024   Brief hospital course: 73 y.o. male with medical history significant for heart failure with recovered EF, CAD, non-insulin -dependent type 2 diabetes, GERD, COPD chronically on 3 L nasal cannula oxygen  being admitted to the hospital with acute exacerbation of COPD as well as sepsis due to community-acquired pneumonia.  Patient states he has had progressive shortness of breath for the past 7 days, he was recently prescribed oral steroid taper as well as Augmentin  by his pulmonologist on 5/20 but noticed no improvement.  He has had progressive shortness of breath, cough productive of scant sputum.  He denies any chest pain, fevers, nausea, vomiting.  Called EMS this morning, was noted to be saturating in the 80s on 3 L/min.  Received 3 DuoNebs with EMS, as well as 125 mg Solu-Medrol .  After treatment with EMS as well as in the ER, patient reported to be looking more comfortable however he tells me he does not feel much better than he did at home this morning.   5/27.  Patient still not feeling well.  Still with shortness of breath and cough and wheezing.  Admitted with sepsis secondary to pneumonia and COPD exacerbation.  5/28: Still not feeling well and becoming short of breath with minor exertion.  Wheezing seems improving.  Likely will go home tomorrow  Assessment and Plan: * Sepsis due to pneumonia [facility]) Present on admission with tachycardia, hypothermia (1 temperature on 5/26), leukocytosis and tachypnea with left upper lobe pneumonia.  No problems swallowing.   - Continue Zithromax  and Rocephin -will complete a 5-day course -Continue with supportive care  COPD with acute exacerbation (HCC) Switching Solu-Medrol  with prednisone  - Continue nebulizer treatments.  Diarrhea Improved, stool studies are negative.   -Continue as needed  Imodium.  Acute respiratory failure with hypoxia and hypercapnia (HCC) Patient had increased oxygen  requirements up to 4 L now , uses 3 L at baseline -Continue supplemental oxygen -wean to baseline as tolerated  Anxiety and depression Continue psychiatric medications  Chronic combined systolic and diastolic congestive heart failure (HCC) Last EF 50%.  Improved ejection fraction from previous.  -Continue Entresto , Farxiga .  No beta-blocker with bronchospasm.  Controlled type 2 diabetes mellitus without complication, without long-term current use of insulin  (HCC) Sugars likely little bit higher with steroids.  Hemoglobin A1c 6.3.  Patient on sliding scale insulin  and also on Farxiga .  GERD (gastroesophageal reflux disease) On Protonix  and Carafate    Subjective: Patient still not feeling well, stating that he is slowly improving but still becoming quite short of breath with ambulation.  Diarrhea improved.  Physical Exam: Vitals:   05/15/24 2103 05/16/24 0518 05/16/24 0722 05/16/24 1524  BP: 123/61 (!) 107/50 110/67 132/72  Pulse: 87 65 77 87  Resp:   18 18  Temp: 98 F (36.7 C) 97.9 F (36.6 C) (!) 97.4 F (36.3 C) 98 F (36.7 C)  TempSrc: Oral Oral  Oral  SpO2: (!) 88% 93% 96% 91%  Weight:      Height:       General.  Frail elderly man, in no acute distress. Pulmonary.  Lungs clear bilaterally, mildly increased work of breathing CV.  Regular rate and rhythm, no JVD, rub or murmur. Abdomen.  Soft, nontender, nondistended, BS positive. CNS.  Alert and oriented .  No focal neurologic deficit. Extremities.  Mild right foot edema with erythema Psychiatry.  Judgment and insight appears normal.    Data Reviewed: Prior data reviewed  Family Communication: Discussed with patient  Disposition: Status is: Inpatient Remains inpatient appropriate because: Continue IV antibiotics and IV steroids today for bronchospasm and wheezing.  Reassess tomorrow.  Planned Discharge  Destination: Home  DVT prophylaxis.  Lovenox  Time spent: 50 minutes  This record has been created using Conservation officer, historic buildings. Errors have been sought and corrected,but may not always be located. Such creation errors do not reflect on the standard of care.   Author: Luna Salinas, MD 05/16/2024 5:02 PM  For on call review [email or web address].

## 2024-05-16 NOTE — Evaluation (Signed)
 Physical Therapy Evaluation Patient Details Name: Fraser Busche MRN: [id number] DOB: [birth year] Today's Date: 05/16/2024  History of Present Illness  73 y.o. male with medical history significant for heart failure with recovered EF, CAD, non-insulin -dependent type 2 diabetes, GERD, COPD chronically on 3 L nasal cannula oxygen  being admitted to the hospital with acute exacerbation of COPD as well as sepsis due to community-acquired pneumonia.  Patient states he has had progressive shortness of breath for the past 7 days, he was recently prescribed oral steroid taper as well as Augmentin  by his pulmonologist on 5/20 but noticed no improvement.  He has had progressive shortness of breath, cough productive of scant sputum.  Clinical Impression  Pt very pleasant and willing to participate with PT. He endorses feeling weak, but still confident with his mobility/balance/etc.  He ultimately was able to circumambulate the nurses' station on 4L O2, but did fatigue and had SpO2 drop to 84% with some mild DOE/fatigue.  He had no LOBs and was able to do most of the loop w/o AD.  Pt is not at his baseline and will benefit from continued PT to address functional limitations.        If plan is discharge home, recommend the following: Assist for transportation   Can travel by private vehicle        Equipment Recommendations None recommended by PT  Recommendations for Other Services       Functional Status Assessment Patient has had a recent decline in their functional status and demonstrates the ability to make significant improvements in function in a reasonable and predictable amount of time.     Precautions / Restrictions Precautions Precautions: Fall Restrictions Weight Bearing Restrictions Per Provider Order: No      Mobility  Bed Mobility Overal bed mobility: Independent                  Transfers Overall transfer level: Independent Equipment used: None                General transfer comment: Able to rise to standing with relative ease, no LOBs or lightheadedness    Ambulation/Gait Ambulation/Gait assistance: Modified independent (Device/Increase time) Gait Distance (Feet): 200 Feet Assistive device: Rolling walker (2 wheels), None         General Gait Details: Pt initially wanting to use the walker as he felt weak, but after ~75 ft he had the confidence to do the next 125 ft w/o UEs.  Pt with safe but slow cadence t/o the effort.  Pt initially on 3L with SpO2 dropping from low 90s to to mid 80s after ~40 ft, standing rest break with  focused breathing back to low 90s, bumped to 4L and then continued amulation with no further breaks needed but SpO2 down to 84% at the end of the effort with mild SOB.  No LOBs or safety issues t/o the ambulation effort.  Stairs            Wheelchair Mobility     Tilt Bed    Modified Rankin (Stroke Patients Only)       Balance Overall balance assessment: Modified Independent                                           Pertinent Vitals/Pain Pain Assessment Pain Assessment: No/denies pain    Home Living Family/patient expects to be discharged to::  Private residence Living Arrangements: Alone Available Help at Discharge: Available PRN/intermittently (son-in-law typically takes him to MD appts, 2 children locally that can help as needed) Type of Home: House Home Access: Stairs to enter Entrance Stairs-Rails: Can reach both Entrance Stairs-Number of Steps: 3   Home Layout: One level Home Equipment: Rollator (4 wheels);Cane - single point      Prior Function Prior Level of Function : Independent/Modified Independent             Mobility Comments: Pt reports that he sits (and sleeps) in recliner most of the day, not overly active but able to get around as needed ADLs Comments: manages his home w/o assist, does not do yardwork, etc     Extremity/Trunk Assessment   Upper  Extremity Assessment Upper Extremity Assessment: Overall WFL for tasks assessed    Lower Extremity Assessment Lower Extremity Assessment: Overall WFL for tasks assessed       Communication   Communication Communication: No apparent difficulties    Cognition Arousal: Alert Behavior During Therapy: WFL for tasks assessed/performed   PT - Cognitive impairments: No apparent impairments                         Following commands: Intact       Cueing Cueing Techniques: Verbal cues     General Comments General comments (skin integrity, edema, etc.): Pt pleasant and interactive t/o session, prologned ambulation was safe and confident apart from SpO2/breathing issues    Exercises     Assessment/Plan    PT Assessment Patient needs continued PT services  PT Problem List Decreased activity tolerance;Cardiopulmonary status limiting activity       PT Treatment Interventions DME instruction;Gait training;Stair training;Functional mobility training;Therapeutic activities;Balance training;Therapeutic exercise;Patient/family education    PT Goals (Current goals can be found in the Care Plan section)  Acute Rehab PT Goals Patient Stated Goal: get energy back PT Goal Formulation: With patient Time For Goal Achievement: 05/29/24 Potential to Achieve Goals: Good    Frequency Min 1X/week     Co-evaluation               AM-PAC PT "6 Clicks" Mobility  Outcome Measure Help needed turning from your back to your side while in a flat bed without using bedrails?: None Help needed moving from lying on your back to sitting on the side of a flat bed without using bedrails?: None Help needed moving to and from a bed to a chair (including a wheelchair)?: None Help needed standing up from a chair using your arms (e.g., wheelchair or bedside chair)?: None Help needed to walk in hospital room?: None Help needed climbing 3-5 steps with a railing? : A Little 6 Click Score:  23    End of Session Equipment Utilized During Treatment: Gait belt Activity Tolerance: Patient tolerated treatment well;Patient limited by fatigue Patient left: with chair alarm set;with call bell/phone within reach Nurse Communication: Mobility status PT Visit Diagnosis: Muscle weakness (generalized) (M62.81);Difficulty in walking, not elsewhere classified (R26.2)    Time: 0865-7846 PT Time Calculation (min) (ACUTE ONLY): 26 min   Charges:   PT Evaluation $PT Eval Low Complexity: 1 Low PT Treatments $Gait Training: 8-22 mins PT General Charges $$ ACUTE PT VISIT: 1 Visit         Darice Edelman, DPT 05/16/2024, 9:27 AM

## 2024-05-16 NOTE — TOC Initial Note (Signed)
 Transition of Care [facility]) - Initial/Assessment Note    Patient Details  Name: Leslie Sims MRN: [id number] Date of Birth: 12/18/1951  Transition of Care [facility]) CM/SW Contact:    Elsie Halo, RN Phone Number: 05/16/2024, 1:34 PM  Clinical Narrative:                  Patient is a widow of 6 years and lives alone. He drives and doesn't report any difficulty getting to appointments or getting medications. He has a cane and RW at home.  His PCP is Adron Albino with [facility]. He uses [facility] in [place].  The patient has had HH before but can not remember the name of the agency. Patient advised that TOC could identify agency that is [facility] with his insurance.  TOC outreached to [place]  [phone] who accepted referral for [facility] PT.  TOC will continue to follow. Expected Discharge Plan: Home w Home Health Services Barriers to Discharge: Continued Medical Work up   Patient Goals and CMS Choice            Expected Discharge Plan and Services       Living arrangements for the past 2 months: Single Family Home                           HH Arranged: PT HH Agency: [facility] Date HH Agency Contacted: 05/16/24 Time HH Agency Contacted: [phone] Representative spoke with at [facility] Agency: [place]   Prior Living Arrangements/Services Living arrangements for the past 2 months: Single Family Home Lives with:: Self                   Activities of Daily Living   ADL Screening (condition at time of admission) Independently performs ADLs?: Yes (appropriate for developmental age) Is the patient deaf or have difficulty hearing?: No Does the patient have difficulty seeing, even when wearing glasses/contacts?: No Does the patient have difficulty concentrating, remembering, or making decisions?: No  Permission Sought/Granted                  Emotional Assessment       Orientation: : Oriented to Self, Oriented to Place, Oriented to   Time, Oriented to Situation   Psych Involvement: No (comment)  Admission diagnosis:  COPD exacerbation (HCC) [J44.1] Sepsis due to pneumonia (HCC) [J18.9, A41.9] Pneumonia of left upper lobe due to infectious organism [J18.9] Sepsis, due to unspecified organism, unspecified whether acute organ dysfunction present (HCC) [A41.9] Acute hypoxic respiratory failure (HCC) [J96.01] Patient Active Problem List   Diagnosis Date Noted   Diarrhea 05/15/2024   Anxiety and depression 05/15/2024   Sepsis due to pneumonia (HCC) 05/14/2024   Chronic combined systolic and diastolic congestive heart failure (HCC) 04/04/2023   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   Anemia due to antineoplastic chemotherapy 06/26/2022   Constipation 06/19/2022   Encounter for antineoplastic chemotherapy 05/31/2022   Weight loss 05/31/2022   Dysphagia 05/31/2022   Primary esophageal squamous cell carcinoma (HCC) 05/04/2022   COPD exacerbation (HCC) 07/31/2021   Prostate cancer (HCC) 10/11/2020   Elevated PSA 07/28/2020   Iron deficiency anemia 05/20/2020   Goals of care, counseling/discussion    Palliative care by specialist    Healthcare-associated pneumonia 01/28/2019   COPD with acute exacerbation (HCC) 12/31/2018   Generalized anxiety disorder 11/12/2018   Moderate episode of recurrent major depressive disorder (HCC) 11/12/2018   Chronic respiratory failure with hypoxia (HCC) 09/29/2018  Acute respiratory failure with hypoxia and hypercapnia (HCC) 09/13/2018   Acute on chronic respiratory failure with hypoxia and hypercapnia (HCC) 08/24/2018   Atypical chest pain 07/02/2018   Grief reaction 01/13/2018   COPD with hypoxia (HCC) 12/25/2017   Unstable angina (HCC)    Controlled type 2 diabetes mellitus without complication, without long-term current use of insulin  (HCC) 10/25/2016   Benign neoplasm of ascending colon    Benign neoplasm of descending colon    Benign neoplasm of sigmoid colon    History of colonic  polyps    Pulmonary nodules/lesions, multiple 01/05/2015   Bruit of left carotid artery 11/04/2014   Sleep disorder 07/18/2013   Seasonal and perennial allergic rhinitis 05/29/2013   Bradycardia 04/20/2011   SMOKER 07/30/2010   Coronary artery disease 04/23/2010   Hyperlipidemia 03/24/2010   DEPRESSION/ANXIETY 03/24/2010   Chronic heart failure with preserved ejection fraction (HFpEF) (HCC) 03/24/2010   COPD, very severe (HCC) 03/24/2010   GERD (gastroesophageal reflux disease) 03/24/2010   Essential hypertension 11/21/2009   PCP:  Valere Gata, MD Pharmacy[facility]   [phone] [place] [place] - [address] [place] [address] Phone: [phone] Fax: [phone]     Social Drivers of Health (SDOH) Social History: SDOH Screenings   Food Insecurity: Patient Declined (05/14/2024)  Housing: Patient Declined (05/14/2024)  Transportation Needs: Patient Declined (05/14/2024)  Utilities: Patient Declined (05/14/2024)  Depression (PHQ2-9): High Risk (05/23/2023)  Financial Resource Strain: Low Risk  (03/30/2024)   Received from [facility]  Physical Activity: Inactive (01/06/2022)   Received from [facility], [facility]  Social Connections: Patient Declined (05/14/2024)  Stress: Stress Concern Present (01/06/2022)   Received from [facility], [facility]  Tobacco Use: Medium Risk (05/14/2024)   SDOH Interventions:     Readmission Risk Interventions    07/12/2022   11:24 AM 07/11/2022   11:17 AM  Readmission Risk Prevention Plan  Transportation Screening  Complete  PCP or Specialist Appt within 3-5 Days Complete   HRI or Home Care Consult  Complete  Palliative Care Screening  Not Applicable  Medication Review (RN Care Manager)  Complete

## 2024-05-17 DIAGNOSIS — J189 Pneumonia, unspecified organism: Secondary | ICD-10-CM | POA: Diagnosis not present

## 2024-05-17 DIAGNOSIS — J9601 Acute respiratory failure with hypoxia: Secondary | ICD-10-CM | POA: Diagnosis not present

## 2024-05-17 DIAGNOSIS — R197 Diarrhea, unspecified: Secondary | ICD-10-CM | POA: Diagnosis not present

## 2024-05-17 DIAGNOSIS — J441 Chronic obstructive pulmonary disease with (acute) exacerbation: Secondary | ICD-10-CM | POA: Diagnosis not present

## 2024-05-17 LAB — GLUCOSE, CAPILLARY
Glucose-Capillary: 99 mg/dL (ref 70–99)
Glucose-Capillary: 138 mg/dL — ABNORMAL HIGH (ref 70–99)
Glucose-Capillary: 166 mg/dL — ABNORMAL HIGH (ref 70–99)
Glucose-Capillary: 118 mg/dL — ABNORMAL HIGH (ref 70–99)

## 2024-05-17 MED ORDER — PREDNISONE 50 MG PO TABS
50.0000 mg | ORAL_TABLET | Freq: Every day | ORAL | Status: AC
  Administered 2024-05-18: 50 mg via ORAL
  Filled 2024-05-17: qty 1

## 2024-05-17 MED ORDER — SODIUM CHLORIDE 0.9 % IV SOLN
2.0000 g | INTRAVENOUS | Status: AC
  Administered 2024-05-18: 2 g via INTRAVENOUS
  Filled 2024-05-17: qty 20

## 2024-05-17 MED ORDER — AZITHROMYCIN 500 MG PO TABS
500.0000 mg | ORAL_TABLET | Freq: Every day | ORAL | Status: AC
  Administered 2024-05-18: 500 mg via ORAL
  Filled 2024-05-17: qty 1

## 2024-05-17 NOTE — Progress Notes (Signed)
 PHARMACY - PHYSICIAN COMMUNICATION CRITICAL VALUE ALERT - BLOOD CULTURE IDENTIFICATION (BCID)  Ebrima Moo Gravley is an 73 y.o. male who presented to [facility] on 05/14/2024 with a chief complaint of cough and shortness of breath  Assessment:  blood culture from 5/26 with GPR in 1 of 4 bottles.  BCID are not performed in these cases.    Name of physician (or Provider) Contacted: Dr Ariel Begun  Current antibiotics: ceftriaxone  and azithromycin   Changes to prescribed antibiotics recommended:  Patient is on recommended antibiotics - No changes needed - blood culture likely contaminant and monitor on current therapy  No results found. However, due to the size of the patient record, not all encounters were searched. Please check Results Review for a complete set of results.  Marven Veley, PharmD, BCPS, BCIDP Work Cell: [phone] 05/17/2024 9:24 AM

## 2024-05-17 NOTE — Care Management Important Message (Signed)
 Important Message  Patient Details  Name: Leslie Sims MRN: [id number] Date of Birth: November 19, 1951   Important Message Given:  Yes - Medicare IM     Eliyana Pagliaro W, CMA 05/17/2024, 11:24 AM

## 2024-05-17 NOTE — TOC Progression Note (Signed)
 Transition of Care [facility]) - Progression Note    Patient Details  Name: Leslie Sims MRN: [id number] Date of Birth: 1951-04-28  Transition of Care [facility]) CM/SW Contact  Crayton Docker, RN 05/17/2024, 1:45 PM  Clinical Narrative:     CM to patient's room regarding discharge care planning. Per patient lives alone and uses home oxygen  at 3 LPM via [employer]. Per patient, patient's brother in law, Lawton Price will provide transportation at discharge.   Expected Discharge Plan: Home w Home Health Services Barriers to Discharge: Continued Medical Work up  Expected Discharge Plan and Services      Living arrangements for the past 2 months: Single Family Home      HH Arranged: PT HH Agency: [facility] Date [facility] Agency Contacted: 05/16/24 Time HH Agency Contacted: 1334 Representative spoke with at [facility] Agency: [place]    Social Determinants of Health (SDOH) Interventions SDOH Screenings   Food Insecurity: Patient Declined (05/14/2024)  Housing: Patient Declined (05/14/2024)  Transportation Needs: Patient Declined (05/14/2024)  Utilities: Patient Declined (05/14/2024)  Depression (PHQ2-9): High Risk (05/23/2023)  Financial Resource Strain: Low Risk  (03/30/2024)   Received from [facility]  Physical Activity: Inactive (01/06/2022)   Received from [facility], [facility]  Social Connections: Patient Declined (05/14/2024)  Stress: Stress Concern Present (01/06/2022)   Received from [facility], [facility]  Tobacco Use: Medium Risk (05/14/2024)    Readmission Risk Interventions    07/12/2022   11:24 AM 07/11/2022   11:17 AM  Readmission Risk Prevention Plan  Transportation Screening  Complete  PCP or Specialist Appt within 3-5 Days Complete   HRI or Home Care Consult  Complete  Palliative Care Screening  Not Applicable  Medication Review (RN Care Manager)  Complete

## 2024-05-17 NOTE — Plan of Care (Signed)

## 2024-05-17 NOTE — Progress Notes (Signed)
 Progress Note   Patient: Leslie Sims [facility] YQM:[id number] DOB: [birth year] DOA: 05/14/2024     3 DOS: the patient was seen and examined on 05/17/2024   Brief hospital course: 74 y.o. male with medical history significant for heart failure with recovered EF, CAD, non-insulin -dependent type 2 diabetes, GERD, COPD chronically on 3 L nasal cannula oxygen  being admitted to the hospital with acute exacerbation of COPD as well as sepsis due to community-acquired pneumonia.  Patient states he has had progressive shortness of breath for the past 7 days, he was recently prescribed oral steroid taper as well as Augmentin  by his pulmonologist on 5/20 but noticed no improvement.  He has had progressive shortness of breath, cough productive of scant sputum.  He denies any chest pain, fevers, nausea, vomiting.  Called EMS this morning, was noted to be saturating in the 80s on 3 L/min.  Received 3 DuoNebs with EMS, as well as 125 mg Solu-Medrol .  After treatment with EMS as well as in the ER, patient reported to be looking more comfortable however he tells me he does not feel much better than he did at home this morning.   5/27.  Patient still not feeling well.  Still with shortness of breath and cough and wheezing.  Admitted with sepsis secondary to pneumonia and COPD exacerbation.  5/28: Still not feeling well and becoming short of breath with minor exertion.  Wheezing seems improving.   5/29: Still feeling short of breath and wheezing.  Assessment and Plan: * Sepsis due to pneumonia [facility]) Present on admission with tachycardia, hypothermia (1 temperature on 5/26), leukocytosis and tachypnea with left upper lobe pneumonia.  No problems swallowing.   - Continue Zithromax  and Rocephin -will complete a 5-day course -Continue with supportive care  COPD with acute exacerbation (HCC) Still having some wheeze and shortness of breath - Continue with prednisone  - Continue nebulizer treatments.  Diarrhea Improved,  stool studies are negative.   -Continue as needed Imodium .  Acute respiratory failure with hypoxia and hypercapnia (HCC) Oxygen  requirement now improved to baseline -Continue supplemental oxygen -wean to baseline as tolerated  Anxiety and depression Continue psychiatric medications  Chronic combined systolic and diastolic congestive heart failure (HCC) Last EF 50%.  Improved ejection fraction from previous.  -Continue Entresto , Farxiga .  No beta-blocker with bronchospasm.  Controlled type 2 diabetes mellitus without complication, without long-term current use of insulin  (HCC) Sugars likely little bit higher with steroids.  Hemoglobin A1c 6.3.  Patient on sliding scale insulin  and also on Farxiga .  GERD (gastroesophageal reflux disease) On Protonix  and Carafate    Subjective: Patient was still not feeling well, complaining of shortness of breath and wheezing and does not think that he can take care of himself at home at this time.  Physical Exam: Vitals:   05/16/24 1940 05/16/24 2054 05/17/24 0350 05/17/24 0745  BP:  126/72 (!) 108/53 135/84  Pulse:  73 69 90  Resp:  15 16 18   Temp:  98.6 F (37 C) 98 F (36.7 C) 97.8 F (36.6 C)  TempSrc:      SpO2: 95% 95% (!) 88% 95%  Weight:      Height:       General.  Well-developed elderly man, in no acute distress. Pulmonary.  Scattered wheeze bilaterally, normal respiratory effort. CV.  Regular rate and rhythm, no JVD, rub or murmur. Abdomen.  Soft, nontender, nondistended, BS positive. CNS.  Alert and oriented .  No focal neurologic deficit. Extremities.  No edema, no cyanosis, pulses intact  and symmetrical. Psychiatry.  Judgment and insight appears normal.    Data Reviewed: Prior data reviewed  Family Communication: Discussed with patient  Disposition: Status is: Inpatient Remains inpatient appropriate because: Continue IV antibiotics and  steroids today for bronchospasm and wheezing.  Reassess tomorrow.  Planned  Discharge Destination: Home  DVT prophylaxis.  Lovenox  Time spent: 50 minutes  This record has been created using Conservation officer, historic buildings. Errors have been sought and corrected,but may not always be located. Such creation errors do not reflect on the standard of care.   Author: Luna Salinas, MD 05/17/2024 2:50 PM  For on call review [email or web address].

## 2024-05-18 DIAGNOSIS — R7881 Bacteremia: Secondary | ICD-10-CM

## 2024-05-18 DIAGNOSIS — J189 Pneumonia, unspecified organism: Secondary | ICD-10-CM | POA: Diagnosis not present

## 2024-05-18 DIAGNOSIS — B957 Other staphylococcus as the cause of diseases classified elsewhere: Secondary | ICD-10-CM

## 2024-05-18 DIAGNOSIS — Z87891 Personal history of nicotine dependence: Secondary | ICD-10-CM

## 2024-05-18 DIAGNOSIS — J9601 Acute respiratory failure with hypoxia: Secondary | ICD-10-CM | POA: Diagnosis not present

## 2024-05-18 DIAGNOSIS — J441 Chronic obstructive pulmonary disease with (acute) exacerbation: Secondary | ICD-10-CM | POA: Diagnosis not present

## 2024-05-18 DIAGNOSIS — R197 Diarrhea, unspecified: Secondary | ICD-10-CM | POA: Diagnosis not present

## 2024-05-18 DIAGNOSIS — B9689 Other specified bacterial agents as the cause of diseases classified elsewhere: Secondary | ICD-10-CM

## 2024-05-18 LAB — CBC WITH DIFFERENTIAL/PLATELET
Abs Immature Granulocytes: 0.4 10*3/uL — ABNORMAL HIGH (ref 0.00–0.07)
Basophils Absolute: 0 10*3/uL (ref 0.0–0.1)
Basophils Relative: 0 %
Eosinophils Absolute: 0 10*3/uL (ref 0.0–0.5)
Eosinophils Relative: 0 %
HCT: 39.1 % (ref 39.0–52.0)
Hemoglobin: 12.1 g/dL — ABNORMAL LOW (ref 13.0–17.0)
Immature Granulocytes: 2 %
Lymphocytes Relative: 2 %
Lymphs Abs: 0.3 10*3/uL — ABNORMAL LOW (ref 0.7–4.0)
MCH: 27.5 pg (ref 26.0–34.0)
MCHC: 30.9 g/dL (ref 30.0–36.0)
MCV: 88.9 fL (ref 80.0–100.0)
Monocytes Absolute: 0.5 10*3/uL (ref 0.1–1.0)
Monocytes Relative: 3 %
Neutro Abs: 16.3 10*3/uL — ABNORMAL HIGH (ref 1.7–7.7)
Neutrophils Relative %: 93 %
Platelets: 388 10*3/uL (ref 150–400)
RBC: 4.4 MIL/uL (ref 4.22–5.81)
RDW: 14.6 % (ref 11.5–15.5)
WBC: 17.4 10*3/uL — ABNORMAL HIGH (ref 4.0–10.5)
nRBC: 0 % (ref 0.0–0.2)

## 2024-05-18 LAB — RESPIRATORY PANEL BY PCR

## 2024-05-18 LAB — PROCALCITONIN: Procalcitonin: <0.1 ng/mL

## 2024-05-18 LAB — GLUCOSE, CAPILLARY
Glucose-Capillary: 109 mg/dL — ABNORMAL HIGH (ref 70–99)
Glucose-Capillary: 110 mg/dL — ABNORMAL HIGH (ref 70–99)
Glucose-Capillary: 141 mg/dL — ABNORMAL HIGH (ref 70–99)
Glucose-Capillary: 118 mg/dL — ABNORMAL HIGH (ref 70–99)

## 2024-05-18 NOTE — Progress Notes (Signed)
 Progress Note   Patient: Leslie Sims [facility] FAO:[id number] DOB: 1950/12/24 DOA: 05/14/2024     4 DOS: the patient was seen and examined on 05/18/2024   Brief hospital course: 73 y.o. male with medical history significant for heart failure with recovered EF, CAD, non-insulin -dependent type 2 diabetes, GERD, COPD chronically on 3 L nasal cannula oxygen  being admitted to the hospital with acute exacerbation of COPD as well as sepsis due to community-acquired pneumonia.  Patient states he has had progressive shortness of breath for the past 7 days, he was recently prescribed oral steroid taper as well as Augmentin  by his pulmonologist on 5/20 but noticed no improvement.  He has had progressive shortness of breath, cough productive of scant sputum.  He denies any chest pain, fevers, nausea, vomiting.  Called EMS this morning, was noted to be saturating in the 80s on 3 L/min.  Received 3 DuoNebs with EMS, as well as 125 mg Solu-Medrol .  After treatment with EMS as well as in the ER, patient reported to be looking more comfortable however he tells me he does not feel much better than he did at home this morning.   5/27.  Patient still not feeling well.  Still with shortness of breath and cough and wheezing.  Admitted with sepsis secondary to pneumonia and COPD exacerbation.  5/28: Still not feeling well and becoming short of breath with minor exertion.  Wheezing seems improving.   5/29: Still feeling short of breath and wheezing.  5/30: Hemodynamically stable and feeling much improved.  Blood cultures started turning positive after more than 3 years, 1 set with staph epi and a corynebacterium.  Likely a contaminant, case was discussed with infectious disease and they were recommending repeat cultures so discharge got postponed.  Assessment and Plan: * Sepsis due to pneumonia [facility]) Present on admission with tachycardia, hypothermia (1 temperature on 5/26), leukocytosis and tachypnea with left upper lobe  pneumonia.  No problems swallowing.   Blood cultures started turning positive after 3 days, identified as staph epi and corynebacterium JK-discussed with ID and decided to repeat blood cultures-likely a contaminant - Continue Zithromax  and Rocephin -will complete a 5-day course -Continue with supportive care  COPD with acute exacerbation (HCC) Still having some wheeze and shortness of breath - Continue with prednisone  - Continue nebulizer treatments.  Diarrhea Improved, stool studies are negative.   -Continue as needed Imodium .  Acute respiratory failure with hypoxia and hypercapnia (HCC) Oxygen  requirement now improved to baseline -Continue supplemental oxygen -wean to baseline as tolerated  Anxiety and depression Continue psychiatric medications  Chronic combined systolic and diastolic congestive heart failure (HCC) Last EF 50%.  Improved ejection fraction from previous.  -Continue Entresto , Farxiga .  No beta-blocker with bronchospasm.  Controlled type 2 diabetes mellitus without complication, without long-term current use of insulin  (HCC) Sugars likely little bit higher with steroids.  Hemoglobin A1c 6.3.  Patient on sliding scale insulin  and also on Farxiga .  GERD (gastroesophageal reflux disease) On Protonix  and Carafate    Subjective: Patient was resting comfortably when seen today.  Feeling much improved.  No shortness of breath today.  Wants to go home.  Physical Exam: Vitals:   05/17/24 2007 05/17/24 2016 05/18/24 0353 05/18/24 0755  BP:  112/73 118/63 (!) 141/76  Pulse:  82 68 81  Resp:  18 17 18   Temp:  98.3 F (36.8 C) 98.1 F (36.7 C) 98.1 F (36.7 C)  TempSrc:  Oral    SpO2: 91% 97% 97% 93%  Weight:  Height:       General.  Well-developed gentleman, in no acute distress. Pulmonary.  Lungs clear bilaterally, normal respiratory effort. CV.  Regular rate and rhythm, no JVD, rub or murmur. Abdomen.  Soft, nontender, nondistended, BS positive. CNS.   Alert and oriented .  No focal neurologic deficit. Extremities.  No edema, no cyanosis, pulses intact and symmetrical. Psychiatry.  Judgment and insight appears normal.     Data Reviewed: Prior data reviewed  Family Communication: Discussed with patient  Disposition: Status is: Inpatient Remains inpatient appropriate because: Continue IV antibiotics and  steroids today for bronchospasm and wheezing.  Reassess tomorrow.  Planned Discharge Destination: Home  DVT prophylaxis.  Lovenox  Time spent: 50 minutes  This record has been created using Conservation officer, historic buildings. Errors have been sought and corrected,but may not always be located. Such creation errors do not reflect on the standard of care.   Author: Luna Salinas, MD 05/18/2024 1:25 PM  For on call review [email or web address].

## 2024-05-18 NOTE — Progress Notes (Signed)
 Physical Therapy Treatment Patient Details Name: Leslie Sims MRN: [id number] DOB: January 22, 1951 Today's Date: 05/18/2024   History of Present Illness 73 y.o. male with medical history significant for heart failure with recovered EF, CAD, non-insulin -dependent type 2 diabetes, GERD, COPD chronically on 3 L nasal cannula oxygen  being admitted to the hospital with acute exacerbation of COPD as well as sepsis due to community-acquired pneumonia.  Patient states he has had progressive shortness of breath for the past 7 days, he was recently prescribed oral steroid taper as well as Augmentin  by his pulmonologist on 5/20 but noticed no improvement.  He has had progressive shortness of breath, cough productive of scant sputum.    PT Comments  Progressive increase in gait distance and overall activity tolerance; does prefer to maintain use of RW at this time.  BORG 7-8/10 after distance (200' x2), cuing for activity pacing, energy conservation throughout.  Does desat to 87% with gait efforts on 3L supplemental O2; recovers >90% within 30-40 seconds of seated rest and pursed lip breathing.  Encouraged for additional gait/mobilization efforts outside of therapy sessions to optimize pulmonary function and functional activity tolerance.     If plan is discharge home, recommend the following:     Can travel by private vehicle        Equipment Recommendations  None recommended by PT    Recommendations for Other Services       Precautions / Restrictions Precautions Precautions: Fall Restrictions Weight Bearing Restrictions Per Provider Order: No     Mobility  Bed Mobility Overal bed mobility: Independent                  Transfers Overall transfer level: Independent Equipment used: None                    Ambulation/Gait Ambulation/Gait assistance: Modified independent (Device/Increase time) Gait Distance (Feet): 200 Feet (x2) Assistive device: Rolling walker (2  wheels)   Gait velocity: 10' walk time, 7-8 seconds     General Gait Details: reciprocal stepping pattern with fair cadence; cuing to minimize conversation during gait (to minimize SOB).  Does prefer to utilize RW at this time.  BORG 7-8/10 after distance; cuing for activity pacing, pursed lip breathing throughout   Stairs             Wheelchair Mobility     Tilt Bed    Modified Rankin (Stroke Patients Only)       Balance Overall balance assessment: Needs assistance Sitting-balance support: No upper extremity supported, Feet supported Sitting balance-Leahy Scale: Good     Standing balance support: Bilateral upper extremity supported Standing balance-Leahy Scale: Good                              Communication    Cognition Arousal: Alert Behavior During Therapy: WFL for tasks assessed/performed   PT - Cognitive impairments: No apparent impairments                                Cueing    Exercises      General Comments        Pertinent Vitals/Pain Pain Assessment Pain Assessment: No/denies pain    Home Living                          Prior Function  PT Goals (current goals can now be found in the care plan section) Acute Rehab PT Goals Patient Stated Goal: get energy back PT Goal Formulation: With patient Time For Goal Achievement: 05/29/24 Potential to Achieve Goals: Good Progress towards PT goals: Progressing toward goals    Frequency    Min 1X/week      PT Plan      Co-evaluation              AM-PAC PT "6 Clicks" Mobility   Outcome Measure  Help needed turning from your back to your side while in a flat bed without using bedrails?: None Help needed moving from lying on your back to sitting on the side of a flat bed without using bedrails?: None Help needed moving to and from a bed to a chair (including a wheelchair)?: None Help needed standing up from a chair using your arms  (e.g., wheelchair or bedside chair)?: None Help needed to walk in hospital room?: None Help needed climbing 3-5 steps with a railing? : A Little 6 Click Score: 23    End of Session Equipment Utilized During Treatment: Gait belt Activity Tolerance: Patient tolerated treatment well;Patient limited by fatigue Patient left: in bed;with call bell/phone within reach Nurse Communication: Mobility status PT Visit Diagnosis: Muscle weakness (generalized) (M62.81);Difficulty in walking, not elsewhere classified (R26.2)     Time: 4782-9562 PT Time Calculation (min) (ACUTE ONLY): 21 min  Charges:    $Gait Training: 8-22 mins PT General Charges $$ ACUTE PT VISIT: 1 Visit                     Koleen Celia H. Bevin Bucks, PT, DPT, NCS 05/18/24, 4:17 PM [phone]

## 2024-05-18 NOTE — Plan of Care (Signed)
  Problem: Education: Goal: Ability to describe self-care measures that may prevent or decrease complications (Diabetes Survival Skills Education) will improve Outcome: Progressing Goal: Individualized Educational Video(s) Outcome: Progressing   Problem: Coping: Goal: Ability to adjust to condition or change in health will improve Outcome: Progressing   Problem: Health Behavior/Discharge Planning: Goal: Ability to identify and utilize available resources and services will improve Outcome: Progressing Goal: Ability to manage health-related needs will improve Outcome: Progressing   Problem: Fluid Volume: Goal: Ability to maintain a balanced intake and output will improve Outcome: Progressing   Problem: Skin Integrity: Goal: Risk for impaired skin integrity will decrease Outcome: Progressing   Problem: Clinical Measurements: Goal: Ability to maintain clinical measurements within normal limits will improve Outcome: Progressing Goal: Will remain free from infection Outcome: Progressing Goal: Diagnostic test results will improve Outcome: Progressing Goal: Respiratory complications will improve Outcome: Progressing Goal: Cardiovascular complication will be avoided Outcome: Progressing   Problem: Pain Managment: Goal: General experience of comfort will improve and/or be controlled Outcome: Progressing   Problem: Safety: Goal: Ability to remain free from injury will improve Outcome: Progressing

## 2024-05-18 NOTE — Plan of Care (Signed)

## 2024-05-18 NOTE — Consult Note (Signed)
 NAME: Leslie Sims  DOB: 1951-09-20  MRN: [id number]  Date/Time: 05/18/2024 12:55 PM  REQUESTING PROVIDER: Dr. Ariel Begun Subjective:  REASON FOR CONSULT: Back anemia due to Corynebacterium ? Leslie Sims is a 73 y.o. 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 CXR on 5/17 was N . Saw Dr.Gonzalez pulmonologist on 5/20 and a CXR was obtained and I showed new infiltrare left upper lobe prescribed augmentin  and azithromycin  as OP. He returned o the ED by EMS on 5/26 with worsenign sob and puls ox low 80s and EMS gave him solumedrol and duonebs  Vitals  05/14/24 09:48  BP 151/77 !  Temp 95.4 F (35.2 C) !  Pulse Rate 96  Resp 20  SpO2 95 %    Latest Reference Range & Units 05/14/24 09:50  WBC 4.0 - 10.5 K/uL 14.8 (H)  Hemoglobin 13.0 - 17.0 g/dL 40.1 (L)  HCT 02.7 - 25.3 % 38.4 (L)  Platelets 150 - 400 K/uL 423 (H)  Creatinine 0.61 - 1.24 mg/dL 6.64   Blood culture sent'CXR left upper lob pneumonia persisting RSV, FLU, COVID neg Started on azithromycin  and IV ceftriaxone .  He received high-dose Solu-Medrol .  Patient was doing well and they were planning to discharge the patient today when the blood culture that was taken on admission came back yesterday 1 out of 2 as Gram positive rods in aerobic bottle and the second set also had gram-positive rods today.  The lab has identified it as Corynebacterium Jeikium and Staph epidermidis in the first set of blood culture I am asked to see the patient for the same Patient is feeling better today He has had cough and shortness of breath for the past 2 weeks Yellow sputum He lives by himself He has no pets at home No travel  history He has limited activity.  No outdoor activities He has home oxygen  He has a penile prosthesis placed many years ago.  But has no issues with that.  It is nonfunctioning He has 2 cardiac stents No other hardware     Past Medical History:  Diagnosis Date   Allergic rhinitis    Anxiety    Back pain    Cancer (HCC)    prostate   CHF (congestive heart failure) (HCC)    Colon polyps 08/2012   colonoscopy; multiple colon polyps; repeat colonoscopy in one year.   Complication of anesthesia    COPD (chronic obstructive pulmonary disease) (HCC)    a. on home O2.   Coronary artery disease 11/2009   a. late presenting ant MI; b. LHC 100% pLAD s/p PCI/DES, 99% mRCA s/p PCI/DES, EF 35%; c. nuclear stress test 05/13: prior ant/inf infarcts w/o ischemia, EF 43%; d. LHC 02/14: widely patent stents with no other obs dz, EF 40%; e. LHC 11/17: LM nl, mLAD 10%, patent LAD stent, dLAD 20%, p-mRCA 10%, mRCA 40%, patent RCA stent, EF 35-45%    Depression    Diabetes (HCC)    Dysphagia    Family history of adverse reaction to anesthesia    GERD (gastroesophageal reflux disease)    Headache(784.0)    Helicobacter pylori (H. pylori)  Hemorrhoids    Hernia    inguinal   HFrEF (heart failure with reduced ejection fraction) (HCC)    a. 10/2016 LV gram: EF 35-45%; b. 06/2018 Echo: EF 40-45%. c. 01/2021 Echo: EF 40-45%   Hyperlipidemia    Hypertension    Insomnia    Ischemic cardiomyopathy    a. 10/2016 LV gram: EF 35-45%; b. 06/2018 Echo: EF 40-45%.   Myalgia    Oxygen  dependent    Peptic ulcer    Pulmonary nodules    ST elevation (STEMI) myocardial infarction involving left anterior descending coronary artery (HCC) 11/2009   a. s/p PCI to the LAD   Status post dilation of esophageal narrowing 2000   Thickening of esophagus    Tinea pedis    Wears dentures    partial upper    Past Surgical History:  Procedure Laterality Date    coronary stents     Admission  12/20/2012   COPD  exacerbation.  [facility].   CARDIAC CATHETERIZATION     CARDIAC CATHETERIZATION  02/05/2013   [facility]   CARDIAC CATHETERIZATION  09/19/2013   [facility] : patent stents with no change in anatomy. EF: 40$   CARDIAC CATHETERIZATION Left 11/12/2016   Procedure: Left Heart Cath and Coronary Angiography;  Surgeon: Wenona Hamilton, MD;  Location: [facility] INVASIVE CV LAB;  Service: Cardiovascular;  Laterality: Left;   COLONOSCOPY     COLONOSCOPY WITH PROPOFOL  N/A 05/18/2016   Procedure: COLONOSCOPY WITH PROPOFOL ;  Surgeon: Marnee Sink, MD;  Location: [facility] ENDOSCOPY;  Service: Endoscopy;  Laterality: N/A;   COLONOSCOPY WITH PROPOFOL  N/A 03/19/2020   Procedure: COLONOSCOPY WITH PROPOFOL ;  Surgeon: Selena Daily, MD;  Location: [facility] ENDOSCOPY;  Service: Gastroenterology;  Laterality: N/A;   CORONARY ANGIOPLASTY WITH STENT PLACEMENT  09/19/2009   LAD 3.0 X23 mm Xience DES, RCA: 4.0 X 15 mm Xience DES   ELECTROMAGNETIC NAVIGATION BROCHOSCOPY N/A 10/27/2017   Procedure: ELECTROMAGNETIC NAVIGATION BRONCHOSCOPY;  Surgeon: Cleve Dale, MD;  Location: [facility] ORS;  Service: Cardiopulmonary;  Laterality: N/A;   ESOPHAGEAL DILATION     ESOPHAGOGASTRODUODENOSCOPY  12/20/2006   ESOPHAGOGASTRODUODENOSCOPY  08/20/2012   ESOPHAGOGASTRODUODENOSCOPY (EGD) WITH PROPOFOL  N/A 04/19/2022   Procedure: ESOPHAGOGASTRODUODENOSCOPY (EGD) WITH PROPOFOL ;  Surgeon: Shane Darling, MD;  Location: [facility] ENDOSCOPY;  Service: Endoscopy;  Laterality: N/A;   ESOPHAGOGASTRODUODENOSCOPY (EGD) WITH PROPOFOL  N/A 04/27/2022   Procedure: ESOPHAGOGASTRODUODENOSCOPY (EGD) WITH PROPOFOL ;  Surgeon: Shane Darling, MD;  Location: [facility] ENDOSCOPY;  Service: Endoscopy;  Laterality: N/A;   ESOPHAGOGASTRODUODENOSCOPY (EGD) WITH PROPOFOL  N/A 11/08/2022   Procedure: ESOPHAGOGASTRODUODENOSCOPY (EGD) WITH PROPOFOL ;  Surgeon: Shane Darling, MD;  Location: [facility] ENDOSCOPY;  Service: Endoscopy;  Laterality: N/A;   EUS N/A 05/20/2022   Procedure: UPPER  ENDOSCOPIC ULTRASOUND (EUS) LINEAR;  Surgeon: Rayford Cake, MD;  Location: [facility] ENDOSCOPY;  Service: Gastroenterology;  Laterality: N/A;  [facility]   HERNIA REPAIR  07/20/2012   L inguinal hernia repair   PENILE PROSTHESIS IMPLANT     RIGHT/LEFT HEART CATH AND CORONARY ANGIOGRAPHY Bilateral 04/04/2023   Procedure: RIGHT/LEFT HEART CATH AND CORONARY ANGIOGRAPHY;  Surgeon: Wenona Hamilton, MD;  Location: [facility] INVASIVE CV LAB;  Service: Cardiovascular;  Laterality: Bilateral;    Social History   Socioeconomic History   Marital status: Widowed    Spouse name: Not on file   Number of children: 5   Years of education: Not on file   Highest education level: Not on file  Occupational History   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  Vaping Use   Vaping status: Never Used  Substance and Sexual Activity   Alcohol use: No    Alcohol/week: 0.0 standard drinks of alcohol   Drug use: No   Sexual activity: Yes  Other Topics Concern   Not on file  Social History Narrative   Marital status: Widowed. Lives alone.    Social Drivers of Corporate investment banker Strain: Low Risk  (03/30/2024)   Received from [name]   Overall Financial Resource Strain (CARDIA)    Difficulty of Paying Living Expenses: Not hard at all  Food Insecurity: Patient Declined (05/14/2024)   Hunger Vital Sign    Worried About Running Out of Food in the Last Year: Patient declined    Ran Out of Food in the Last Year: Patient declined  Transportation Needs: Patient Declined (05/14/2024)   PRAPARE - Administrator, Civil Service  (Medical): Patient declined    Lack of Transportation (Non-Medical): Patient declined  Physical Activity: Inactive (01/06/2022)   [facility], [facility]   Exercise Vital Sign    Days of Exercise per Week: 0 days    Minutes of Exercise per Session: 0 min  Stress: Stress Concern Present (01/06/2022)   Received from [facility], [facility]   [employer] of Occupational Health - Occupational Stress Questionnaire    Feeling of Stress : Rather much  Social Connections: Patient Declined (05/14/2024)   Social Connection and Isolation Panel [NHANES]    Frequency of Communication with Friends and Family: Patient declined    Frequency of Social Gatherings with Friends and Family: Patient declined    Attends Religious Services: Patient declined    Database administrator or Organizations: Patient declined    Attends Banker Meetings: Patient declined    Marital Status: Patient declined  Intimate Partner Violence: Patient Declined (05/14/2024)   Humiliation, Afraid, Rape, and Kick questionnaire    Fear of Current or Ex-Partner: Patient declined    Emotionally Abused: Patient declined    Physically Abused: Patient declined    Sexually Abused: Patient declined    Family History  Problem Relation Age of Onset   Heart attack Brother        Brother #1   Diabetes Brother    Hypertension Brother        #3   Coronary artery disease Father 39       deceased   Heart attack Father    Diabetes Father    Heart disease Father    COPD Mother 45       deceased   Alcohol abuse Sister        polysubstance abuse   COPD Sister    Lung cancer Sister    Alcohol abuse Sister        polysubstance abuse   Penile cancer Brother    Diabetes Brother    Prostate cancer Neg Hx    Bladder Cancer Neg Hx    Kidney cancer Neg  Hx    Allergies  Allergen Reactions   Prozac  [Fluoxetine  Hcl] Shortness Of Breath    Hydroxyzine     Bupropion Other (See Comments)    "Makes me feel funny" Other reaction(s): Other (See Comments) Unknown/"made me feel real funny" "Makes me feel funny" "Makes me feel funny" Other reaction(s): Other (See Comments) Unknown/"made me feel real funny" "Makes me feel funny"   Effexor Xr [Venlafaxine Hcl Er] Other (See Comments)    "Makes me feel funny"   I? Current Facility-Administered Medications  Medication Dose Route Frequency Provider Last Rate Last Admin   acetaminophen  (TYLENOL ) tablet 650 mg  650 mg Oral Q6H PRN Jannette Mend, Mir M, MD       Or   acetaminophen  (TYLENOL ) suppository 650 mg  650 mg Rectal Q6H PRN Jannette Mend, Mir M, MD       albuterol  (PROVENTIL ) (2.5 MG/3ML) 0.083% nebulizer solution 2.5 mg  2.5 mg Nebulization Q2H PRN Jannette Mend, Mir M, MD   2.5 mg at 05/18/24 1241   ALPRAZolam (XANAX) tablet 0.5 mg  0.5 mg Oral TID PRN Gaylin Ke, MD       aspirin  EC tablet 81 mg  81 mg Oral Daily Ikramullah, Mir M, MD   81 mg at 05/18/24 9147   atorvastatin  (LIPITOR) tablet 80 mg  80 mg Oral Daily Verla Glaze, MD   80 mg at 05/18/24 0913   dapagliflozin  propanediol (FARXIGA ) tablet 10 mg  10 mg Oral QAC breakfast Jannette Mend, Mir M, MD   10 mg at 05/18/24 8295   enoxaparin  (LOVENOX ) injection 40 mg  40 mg Subcutaneous Q24H Jannette Mend, Mir M, MD   40 mg at 05/18/24 1125   ezetimibe  (ZETIA ) tablet 10 mg  10 mg Oral Daily Verla Glaze, MD   10 mg at 05/18/24 6213   FLUoxetine  (PROZAC ) capsule 20 mg  20 mg Oral Daily Ikramullah, Mir M, MD   20 mg at 05/18/24 0913   fluticasone  (FLONASE ) 50 MCG/ACT nasal spray 2 spray  2 spray Each Nare Daily Jannette Mend, Mir M, MD   2 spray at 05/18/24 0865   insulin  aspart (novoLOG ) injection 0-15 Units  0-15 Units Subcutaneous TID WC Jannette Mend, Mir M, MD   3 Units at 05/17/24 1712   insulin  aspart (novoLOG ) injection 0-5 Units  0-5 Units Subcutaneous QHS Jannette Mend, Mir M, MD       ipratropium-albuterol  (DUONEB) 0.5-2.5 (3)  MG/3ML nebulizer solution 3 mL  3 mL Nebulization TID Verla Glaze, MD   3 mL at 05/17/24 2007   loperamide (IMODIUM) capsule 2 mg  2 mg Oral Q8H PRN Verla Glaze, MD       loratadine  (CLARITIN ) tablet 10 mg  10 mg Oral Daily Ikramullah, Mir M, MD   10 mg at 05/18/24 7846   mirtazapine  (REMERON ) tablet 45 mg  45 mg Oral QHS Jannette Mend, Mir M, MD   45 mg at 05/17/24 2219   ondansetron  (ZOFRAN ) tablet 4 mg  4 mg Oral Q6H PRN Jannette Mend, Mir M, MD       Or   ondansetron  (ZOFRAN ) injection 4 mg  4 mg Intravenous Q6H PRN Jannette Mend, Mir M, MD       pantoprazole  (PROTONIX ) EC tablet 40 mg  40 mg Oral BID AC Ikramullah, Mir M, MD   40 mg at 05/18/24 9629   sacubitril -valsartan  (ENTRESTO ) 49-51 mg per tablet  1 tablet Oral BID Gaylin Ke, MD   1 tablet at 05/18/24 0913   sucralfate  (CARAFATE ) tablet 1 g  1  g Oral TID Annamae Kick, Mir M, MD   1 g at 05/18/24 9528   traZODone  (DESYREL ) tablet 25 mg  25 mg Oral QHS PRN Gaylin Ke, MD   25 mg at 05/17/24 2219     Abtx:  Anti-infectives (From admission, onward)    Start     Dose/Rate Route Frequency Ordered Stop   05/18/24 1100  cefTRIAXone  (ROCEPHIN ) 2 g in sodium chloride  0.9 % 100 mL IVPB        2 g 200 mL/hr over 30 Minutes Intravenous Every 24 hours 05/17/24 1315 05/18/24 1155   05/18/24 1000  azithromycin  (ZITHROMAX ) tablet 500 mg        500 mg Oral Daily 05/17/24 1315 05/18/24 0913   05/16/24 1100  cefTRIAXone  (ROCEPHIN ) 2 g in sodium chloride  0.9 % 100 mL IVPB  Status:  Discontinued        2 g 200 mL/hr over 30 Minutes Intravenous Every 24 hours 05/15/24 1226 05/17/24 1315   05/16/24 1000  azithromycin  (ZITHROMAX ) tablet 500 mg  Status:  Discontinued        500 mg Oral Daily 05/15/24 1206 05/17/24 1315   05/15/24 1100  azithromycin  (ZITHROMAX ) 500 mg in sodium chloride  0.9 % 250 mL IVPB  Status:  Discontinued        500 mg 250 mL/hr over 60 Minutes Intravenous Every 24 hours 05/14/24 1233 05/15/24 1206   05/15/24 1100   cefTRIAXone  (ROCEPHIN ) 1 g in sodium chloride  0.9 % 100 mL IVPB  Status:  Discontinued        1 g 200 mL/hr over 30 Minutes Intravenous Every 24 hours 05/14/24 1233 05/15/24 1226   05/14/24 1015  cefTRIAXone  (ROCEPHIN ) 1 g in sodium chloride  0.9 % 100 mL IVPB        1 g 200 mL/hr over 30 Minutes Intravenous  Once 05/14/24 1002 05/14/24 1055   05/14/24 1015  azithromycin  (ZITHROMAX ) 500 mg in sodium chloride  0.9 % 250 mL IVPB        500 mg 250 mL/hr over 60 Minutes Intravenous  Once 05/14/24 1002 05/14/24 1203       REVIEW OF SYSTEMS:  Const: negative fever, negative chills, negative weight loss Eyes: negative diplopia or visual changes, negative eye pain ENT: negative coryza, negative sore throat Resp: negative cough, hemoptysis, dyspnea Cards: negative for chest pain, palpitations, lower extremity edema GU: negative for frequency, dysuria and hematuria GI: Negative for abdominal pain, diarrhea, bleeding, constipation Skin: negative for rash and pruritus Heme: negative for easy bruising and gum/nose bleeding MS: negative for myalgias, arthralgias, back pain and muscle weakness Neurolo:negative for headaches, dizziness, vertigo, memory problems  Psych: negative for feelings of anxiety, depression  Endocrine: negative for thyroid, diabetes Allergy /Immunology- negative for any medication or food allergies ? Pertinent Positives include : Objective:  VITALS:  BP (!) 141/76 (BP Location: Right Arm)   Pulse 81   Temp 98.1 F (36.7 C)   Resp 18   Ht 5\' 10"  (1.778 m)   Wt 86 kg   SpO2 93%   BMI 27.20 kg/m   PHYSICAL EXAM:  General: Alert, cooperative, no distress at rest, oxygen  by nasal cannula,  Head: Normocephalic, without obvious abnormality, atraumatic. Eyes: Conjunctivae clear, anicteric sclerae. Pupils are equal ENT Nares normal. No drainage or sinus tenderness. Lips, mucosa, and tongue normal. No Thrush Neck: Supple, symmetrical, no adenopathy, thyroid: non tender no  carotid bruit and no JVD. Back: No CVA tenderness. Lungs: Bilateral rhonchi Heart: Regular rate and  rhythm, no murmur, rub or gallop. Abdomen: Soft, non-tender,not distended. Bowel sounds normal. No masses Extremities: atraumatic, no cyanosis. No edema. No clubbing Skin: No rashes or lesions. Or bruising Lymph: Cervical, supraclavicular normal. Neurologic: Grossly non-focal Pertinent Labs Lab Results CBC    Component Value Date/Time   WBC 11.2 (H) 05/15/2024 0453   RBC 3.99 (L) 05/15/2024 0453   HGB 11.0 (L) 05/15/2024 0453   HGB 13.0 01/26/2024 1014   HGB 15.1 05/22/2018 1601   HCT 34.8 (L) 05/15/2024 0453   HCT 44.3 05/22/2018 1601   PLT 340 05/15/2024 0453   PLT 286 01/26/2024 1014   PLT 287 05/22/2018 1601   MCV 87.2 05/15/2024 0453   MCV 88 05/22/2018 1601   MCV 86 01/11/2013 0546   MCH 27.6 05/15/2024 0453   MCHC 31.6 05/15/2024 0453   RDW 14.4 05/15/2024 0453   RDW 13.5 05/22/2018 1601   RDW 14.7 (H) 01/11/2013 0546   LYMPHSABS 0.6 (L) 05/14/2024 0950   LYMPHSABS 1.8 05/22/2018 1601   LYMPHSABS 0.5 (L) 01/11/2013 0546   MONOABS 1.5 (H) 05/14/2024 0950   MONOABS 0.7 01/11/2013 0546   EOSABS 0.1 05/14/2024 0950   EOSABS 0.1 05/22/2018 1601   EOSABS 0.0 01/11/2013 0546   BASOSABS 0.1 05/14/2024 0950   BASOSABS 0.0 05/22/2018 1601   BASOSABS 0.0 01/11/2013 0546       Latest Ref Rng & Units 05/15/2024    4:53 AM 05/14/2024    9:50 AM 05/05/2024    9:46 PM  CMP  Glucose 70 - 99 mg/dL 161  89  096   BUN 8 - 23 mg/dL 19  19  17    Creatinine 0.61 - 1.24 mg/dL 0.45  4.09  8.11   Sodium 135 - 145 mmol/L 140  138  142   Potassium 3.5 - 5.1 mmol/L 4.0  4.1  4.1   Chloride 98 - 111 mmol/L 101  98  103   CO2 22 - 32 mmol/L 26  25  28    Calcium  8.9 - 10.3 mg/dL 8.6  7.9  8.7   Total Protein 6.5 - 8.1 g/dL   6.8   Total Bilirubin 0.0 - 1.2 mg/dL   0.5   Alkaline Phos 38 - 126 U/L   66   AST 15 - 41 U/L   22   ALT 0 - 44 U/L   26       Microbiology: Recent  Results (from the past 240 hours)  Resp panel by RT-PCR (RSV, Flu A&B, Covid) Anterior Nasal Swab     Status: None   Collection Time: 05/14/24 10:20 AM   Specimen: Anterior Nasal Swab  Result Value Ref Range Status   SARS Coronavirus 2 by RT PCR NEGATIVE NEGATIVE Final    Comment: (NOTE) SARS-CoV-2 target nucleic acids are NOT DETECTED.  The SARS-CoV-2 RNA is generally detectable in upper respiratory specimens during the acute phase of infection. The lowest concentration of SARS-CoV-2 viral copies this assay can detect is 138 copies/mL. A negative result does not preclude SARS-Cov-2 infection and should not be used as the sole basis for treatment or other patient management decisions. A negative result may occur with  improper specimen collection/handling, submission of specimen other than nasopharyngeal swab, presence of viral mutation(s) within the areas targeted by this assay, and inadequate number of viral copies(<138 copies/mL). A negative result must be combined with clinical observations, patient history, and epidemiological information. The expected result is Negative.  Fact Sheet for Patients:  BloggerCourse.com  Fact Sheet for Healthcare Providers:  SeriousBroker.it  This test is no t yet approved or cleared by the [place]  FDA and  has been authorized for detection and/or diagnosis of SARS-CoV-2 by FDA under an Emergency Use Authorization (EUA). This EUA will remain  in effect (meaning this test can be used) for the duration of the COVID-19 declaration under Section 564(b)(1) of the Act, 21 U.S.C.section 360bbb-3(b)(1), unless the authorization is terminated  or revoked sooner.       Influenza A by PCR NEGATIVE NEGATIVE Final   Influenza B by PCR NEGATIVE NEGATIVE Final    Comment: (NOTE) The Xpert Xpress SARS-CoV-2/FLU/RSV plus assay is intended as an aid in the diagnosis of influenza from Nasopharyngeal swab  specimens and should not be used as a sole basis for treatment. Nasal washings and aspirates are unacceptable for Xpert Xpress SARS-CoV-2/FLU/RSV testing.  Fact Sheet for Patients: BloggerCourse.com  Fact Sheet for Healthcare Providers: SeriousBroker.it  This test is not yet approved or cleared by the [place]  FDA and has been authorized for detection and/or diagnosis of SARS-CoV-2 by FDA under an Emergency Use Authorization (EUA). This EUA will remain in effect (meaning this test can be used) for the duration of the COVID-19 declaration under Section 564(b)(1) of the Act, 21 U.S.C. section 360bbb-3(b)(1), unless the authorization is terminated or revoked.     Resp Syncytial Virus by PCR NEGATIVE NEGATIVE Final    Comment: (NOTE) Fact Sheet for Patients: BloggerCourse.com  Fact Sheet for Healthcare Providers: SeriousBroker.it  This test is not yet approved or cleared by the [place]  FDA and has been authorized for detection and/or diagnosis of SARS-CoV-2 by FDA under an Emergency Use Authorization (EUA). This EUA will remain in effect (meaning this test can be used) for the duration of the COVID-19 declaration under Section 564(b)(1) of the Act, 21 U.S.C. section 360bbb-3(b)(1), unless the authorization is terminated or revoked.  Performed at [facility], [address] [place], [place] [address]   Blood culture (routine x 2)     Status: Abnormal (Preliminary result)   Collection Time: 05/14/24 12:20 PM   Specimen: BLOOD  Result Value Ref Range Status   Specimen Description   Final    BLOOD RIGHT ANTECUBITAL Performed at [facility], [address] [place], [place] [address]    Special Requests   Final    BOTTLES DRAWN AEROBIC AND ANAEROBIC Blood Culture results may not be optimal due to an inadequate volume of blood received in culture  bottles Performed at [facility], [address], [place], [place] [address]    Culture  Setup Time   Final    GRAM POSITIVE RODS CRITICAL RESULT CALLED TO, READ BACK BY AND VERIFIED WITH: ELVIRA MADUME PHARMD [id number] 05/17/24 [facility] GRAM STAIN REVIEWED-AGREE WITH RESULT DRT    Culture (A)  Final    CORYNEBACTERIUM, GROUP JK STAPHYLOCOCCUS EPIDERMIDIS THE SIGNIFICANCE OF ISOLATING THIS ORGANISM FROM A SINGLE SET OF BLOOD CULTURES WHEN MULTIPLE SETS ARE DRAWN IS UNCERTAIN. PLEASE NOTIFY THE MICROBIOLOGY DEPARTMENT WITHIN ONE WEEK IF SPECIATION AND SENSITIVITIES ARE REQUIRED. CALL MICROBIOLOGY LAB IF SENSITIVITIES ARE REQUIRED. FOR CORYNEBACTERIUM, GROUP JK Performed at [facility], [address] [place], [place] [address]    Report Status PENDING  Incomplete  Blood culture (routine x 2)     Status: None (Preliminary result)   Collection Time: 05/14/24 12:20 PM   Specimen: BLOOD LEFT HAND  Result Value Ref Range Status   Specimen Description  Final    BLOOD LEFT HAND Performed at [facility], [address], [place], [place] [address]    Special Requests   Final    BOTTLES DRAWN AEROBIC AND ANAEROBIC Blood Culture results may not be optimal due to an inadequate volume of blood received in culture bottles   Culture  Setup Time   Final    GRAM POSITIVE RODS AEROBIC BOTTLE ONLY CRITICAL VALUE NOTED.  VALUE IS CONSISTENT WITH PREVIOUSLY REPORTED AND CALLED VALUE. Performed at [facility], [address] [place], [place] [address]    Culture GRAM POSITIVE RODS  Final   Report Status PENDING  Incomplete  C Difficile Quick Screen w PCR reflex     Status: None   Collection Time: 05/15/24 10:26 AM   Specimen: STOOL  Result Value Ref Range Status   C Diff antigen NEGATIVE NEGATIVE Final   C Diff toxin NEGATIVE NEGATIVE Final   C Diff interpretation No C. difficile detected.  Final    Comment: Performed at [facility], [address]  [place], [place] [address]  Gastrointestinal Panel by PCR , Stool     Status: None   Collection Time: 05/15/24 10:26 AM   Specimen: STOOL  Result Value Ref Range Status   Campylobacter species NOT DETECTED NOT DETECTED Final   Plesimonas shigelloides NOT DETECTED NOT DETECTED Final   Salmonella species NOT DETECTED NOT DETECTED Final   Yersinia enterocolitica NOT DETECTED NOT DETECTED Final   Vibrio species NOT DETECTED NOT DETECTED Final   Vibrio cholerae NOT DETECTED NOT DETECTED Final   Enteroaggregative E coli (EAEC) NOT DETECTED NOT DETECTED Final   Enteropathogenic E coli (EPEC) NOT DETECTED NOT DETECTED Final   Enterotoxigenic E coli (ETEC) NOT DETECTED NOT DETECTED Final   Shiga like toxin producing E coli (STEC) NOT DETECTED NOT DETECTED Final   Shigella/Enteroinvasive E coli (EIEC) NOT DETECTED NOT DETECTED Final   Cryptosporidium NOT DETECTED NOT DETECTED Final   Cyclospora cayetanensis NOT DETECTED NOT DETECTED Final   Entamoeba histolytica NOT DETECTED NOT DETECTED Final   Giardia lamblia NOT DETECTED NOT DETECTED Final   Adenovirus F40/41 NOT DETECTED NOT DETECTED Final   Astrovirus NOT DETECTED NOT DETECTED Final   Norovirus GI/GII NOT DETECTED NOT DETECTED Final   Rotavirus A NOT DETECTED NOT DETECTED Final   Sapovirus (I, II, IV, and V) NOT DETECTED NOT DETECTED Final    Comment: Performed at [facility], 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 Did not respond to 1 week of azithromycin  and Augmentin  given as outpatient after left upper lobe pneumonia was diagnosed  Acute hypoxic respiratory failure secondary to  COPD exacerbation with underlying left upper lobe pneumonia Patient received IV ceftriaxone , with azithromycin  and Solu-Medrol .  And he i appears to be improved.  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

## 2024-05-19 ENCOUNTER — Other Ambulatory Visit (HOSPITAL_BASED_OUTPATIENT_CLINIC_OR_DEPARTMENT_OTHER): Payer: Self-pay

## 2024-05-19 ENCOUNTER — Other Ambulatory Visit: Payer: Self-pay

## 2024-05-19 DIAGNOSIS — A419 Sepsis, unspecified organism: Secondary | ICD-10-CM | POA: Diagnosis not present

## 2024-05-19 DIAGNOSIS — J441 Chronic obstructive pulmonary disease with (acute) exacerbation: Secondary | ICD-10-CM | POA: Diagnosis not present

## 2024-05-19 DIAGNOSIS — J9601 Acute respiratory failure with hypoxia: Secondary | ICD-10-CM | POA: Diagnosis not present

## 2024-05-19 DIAGNOSIS — J189 Pneumonia, unspecified organism: Secondary | ICD-10-CM | POA: Diagnosis not present

## 2024-05-19 LAB — GLUCOSE, CAPILLARY
Glucose-Capillary: 91 mg/dL (ref 70–99)
Glucose-Capillary: 120 mg/dL — ABNORMAL HIGH (ref 70–99)

## 2024-05-19 MED ORDER — SPIRONOLACTONE 25 MG PO TABS
12.5000 mg | ORAL_TABLET | Freq: Every day | ORAL | 3 refills | Status: DC
Start: 2024-05-19 — End: 2024-10-23

## 2024-05-19 MED ORDER — SPIRONOLACTONE 25 MG PO TABS
12.5000 mg | ORAL_TABLET | Freq: Every day | ORAL | 3 refills | Status: DC
Start: 2024-05-19 — End: 2024-05-19
  Filled 2024-05-19: qty 90, 180d supply, fill #0

## 2024-05-19 MED ORDER — AMOXICILLIN-POT CLAVULANATE 875-125 MG PO TABS: 1.0000 | ORAL_TABLET | Freq: Two times a day (BID) | ORAL | 0 refills | Status: AC

## 2024-05-19 MED ORDER — PREDNISONE 20 MG PO TABS: 20.0000 mg | ORAL_TABLET | Freq: Every day | ORAL | 0 refills | Status: AC

## 2024-05-19 NOTE — Plan of Care (Signed)

## 2024-05-19 NOTE — Discharge Instructions (Signed)
 [facility].  [address] , [place], [place], [address]. [phone] They will call you to arrange when they can come to see you

## 2024-05-19 NOTE — TOC Transition Note (Signed)
 Transition of Care [facility]) - Discharge Note   Patient Details  Name: Leslie Sims MRN: [id number] Date of Birth: May 17, 1951  Transition of [facility]) CM/SW Contact:  Alexandra Ice, RN Phone Number: 05/19/2024, 11:02 AM   Clinical Narrative:    Patient to discharge today, home with home health services. [facility] accepted patient. Notified [place]  with [facility] patient to discharge today. No additional TOC needs identified.    Final next level of care: Home w Home Health Services Barriers to Discharge: Barriers Resolved   Patient Goals and CMS Choice            Discharge Placement                  Name of family member notified: Daughter Jewel Patient and family notified of of transfer: 05/19/24  Discharge Plan and Services Additional resources added to the After Visit Summary for                    DME Agency: NA       HH Arranged: PT, OT HH Agency: [facility] Date [facility] Agency Contacted: 05/19/24 Time HH Agency Contacted: [phone] Representative spoke with at [facility] Agency: [place]   Social Drivers of Health (SDOH) Interventions SDOH Screenings   Food Insecurity: Patient Declined (05/14/2024)  Housing: Patient Declined (05/14/2024)  Transportation Needs: Patient Declined (05/14/2024)  Utilities: Patient Declined (05/14/2024)  Depression (PHQ2-9): High Risk (05/23/2023)  Financial Resource Strain: Low Risk  (03/30/2024)   Received from [facility]  Physical Activity: Inactive (01/06/2022)   Received from [facility], [facility]  Social Connections: Patient Declined (05/14/2024)  Stress: Stress Concern Present (01/06/2022)   Received from [facility], [facility]  Tobacco Use: Medium Risk (05/14/2024)     Readmission Risk Interventions    07/12/2022   11:24 AM 07/11/2022   11:17 AM  Readmission Risk Prevention Plan  Transportation Screening   Complete  PCP or Specialist Appt within 3-5 Days Complete   HRI or Home Care Consult  Complete  Palliative Care Screening  Not Applicable  Medication Review (RN Care Manager)  Complete

## 2024-05-19 NOTE — Discharge Summary (Addendum)
 Physician Discharge Summary   Patient: Leslie Sims [facility] MRN: [id number] DOB: Sep 25, 1951  Admit date:     05/14/2024  Discharge date: 05/19/24  Discharge Physician: Luna Salinas   PCP: Valere Gata, MD   Recommendations at discharge:  Please follow-up on repeat blood culture results and Fungitell labs Obtain CBC and BMP and follow-up Follow-up with primary care provider Follow-up with pulmonary Patient need repeat chest imaging in 4 to 6 weeks  Discharge Diagnoses: Principal Problem:   Sepsis due to pneumonia [facility]) Active Problems:   COPD with acute exacerbation (HCC)   Diarrhea   Acute respiratory failure with hypoxia and hypercapnia (HCC)   GERD (gastroesophageal reflux disease)   Controlled type 2 diabetes mellitus without complication, without long-term current use of insulin  (HCC)   Chronic combined systolic and diastolic congestive heart failure (HCC)   Anxiety and depression   Sepsis [facility])   Hospital Course: 73 y.o. male with medical history significant for heart failure with recovered EF, CAD, non-insulin -dependent type 2 diabetes, GERD, COPD chronically on 3 L nasal cannula oxygen  being admitted to the hospital with acute exacerbation of COPD as well as sepsis due to community-acquired pneumonia.  Patient states he has had progressive shortness of breath for the past 7 days, he was recently prescribed oral steroid taper as well as Augmentin  by his pulmonologist on 5/20 but noticed no improvement.  He has had progressive shortness of breath, cough productive of scant sputum.  He denies any chest pain, fevers, nausea, vomiting.  Called EMS this morning, was noted to be saturating in the 80s on 3 L/min.  Received 3 DuoNebs with EMS, as well as 125 mg Solu-Medrol .  After treatment with EMS as well as in the ER, patient reported to be looking more comfortable however he tells me he does not feel much better than he did at home this morning.   5/27.  Patient still not feeling  well.  Still with shortness of breath and cough and wheezing.  Admitted with sepsis secondary to pneumonia and COPD exacerbation.  5/28: Still not feeling well and becoming short of breath with minor exertion.  Wheezing seems improving.   5/29: Still feeling short of breath and wheezing.  5/30: Hemodynamically stable and feeling much improved.  Blood cultures started turning positive after more than 3 years, 1 set with staph epi and a corynebacterium.  Likely a contaminant, case was discussed with infectious disease and they were recommending repeat cultures so discharge got postponed.  5/31: Patient remained hemodynamically stable on baseline oxygen  use.  No more wheezing.  Wants to go home.  Blood cultures were repeated yesterday and negative on preliminary results.  Procalcitonin was negative, repeat respiratory viral panel with rhinovirus.  Fungitell labs are sent and pending.  Patient is being discharged on 3 more days of Augmentin  and prednisone . He need to have close follow-up with primary care provider and pulmonologist and they can follow-up the results of these labs.  Patient also needed repeat chest imaging in 4 to 6 weeks to see the resolution of left upper lobe pneumonia.  He will continue on current medications and need to have a close follow-up with his providers for further assistance.  Assessment and Plan: * Sepsis due to pneumonia [facility]) Present on admission with tachycardia, hypothermia (1 temperature on 5/26), leukocytosis and tachypnea with left upper lobe pneumonia.  No problems swallowing.   Blood cultures started turning positive after 3 days, identified as staph epi and corynebacterium JK-discussed with ID  and decided to repeat blood cultures-likely a contaminant - Patient received ceftriaxone  and Zithromax  in the hospital and is being discharged on 3 more days of Augmentin . -Repeat blood cultures are pending -Fungitell labs pending -Continue with supportive  care  COPD with acute exacerbation (HCC) Still having some wheeze and shortness of breath - Continue with prednisone  - Continue nebulizer treatments.  Diarrhea Improved, stool studies are negative.   -Continue as needed Imodium .  Acute respiratory failure with hypoxia and hypercapnia (HCC) Oxygen  requirement now improved to baseline -Continue supplemental oxygen -wean to baseline as tolerated  Anxiety and depression Continue psychiatric medications  Chronic combined systolic and diastolic congestive heart failure (HCC) Last EF 50%.  Improved ejection fraction from previous.  -Continue Entresto , Farxiga .  No beta-blocker with bronchospasm.  Controlled type 2 diabetes mellitus without complication, without long-term current use of insulin  (HCC) Sugars likely little bit higher with steroids.  Hemoglobin A1c 6.3.  Patient on sliding scale insulin  and also on Farxiga .  GERD (gastroesophageal reflux disease) On Protonix  and Carafate   Consultants: Infectious disease Procedures performed: None Disposition: Home health Diet recommendation:  Discharge Diet Orders (From admission, onward)     Start     Ordered   05/19/24 0000  Diet - low sodium heart healthy        05/19/24 1043           Cardiac and Carb modified diet DISCHARGE MEDICATION: Allergies as of 05/19/2024       Reactions   Prozac  [fluoxetine  Hcl] Shortness Of Breath   Hydroxyzine     Bupropion Other (See Comments)   "Makes me feel funny" Other reaction(s): Other (See Comments) Unknown/"made me feel real funny" "Makes me feel funny" "Makes me feel funny" Other reaction(s): Other (See Comments) Unknown/"made me feel real funny" "Makes me feel funny"   Effexor Xr [venlafaxine Hcl Er] Other (See Comments)   "Makes me feel funny"        Medication List     STOP taking these medications    methylPREDNISolone  4 MG Tbpk tablet Commonly known as: MEDROL  DOSEPAK       TAKE these medications     AeroChamber MV inhaler Use as instructed   albuterol  108 (90 Base) MCG/ACT inhaler Commonly known as: VENTOLIN  HFA TAKE 2 PUFFS BY MOUTH EVERY 6 HOURS AS NEEDED FOR WHEEZE OR SHORTNESS OF BREATH   ALPRAZolam  0.5 MG tablet Commonly known as: XANAX  Take 0.5 mg by mouth 3 (three) times daily.   amoxicillin -clavulanate 875-125 MG tablet Commonly known as: AUGMENTIN  Take 1 tablet by mouth 2 (two) times daily for 3 days.   aspirin  81 MG tablet Take 81 mg by mouth daily.   atorvastatin  80 MG tablet Commonly known as: LIPITOR Take 1 tablet (80 mg total) by mouth daily.   bisoprolol  5 MG tablet Commonly known as: ZEBETA  TAKE 1/2 TABLET BY MOUTH DAILY   budesonide  0.5 MG/2ML nebulizer solution Commonly known as: PULMICORT  Take 2 mLs (0.5 mg total) by nebulization in the morning and at bedtime.   ezetimibe  10 MG tablet Commonly known as: ZETIA  Take 1 tablet (10 mg total) by mouth daily.   famotidine  20 MG tablet Commonly known as: PEPCID  TAKE 1 TABLET (20 MG TOTAL) BY MOUTH 2 (TWO) TIMES DAILY. FOR BREAKTHROUGH REFLUX   Farxiga  10 MG Tabs tablet Generic drug: dapagliflozin  propanediol TAKE 1 TABLET BY MOUTH DAILY BEFORE BREAKFAST.   FLUoxetine  20 MG tablet Commonly known as: PROZAC  Take 20 mg by mouth daily.   fluticasone  50  MCG/ACT nasal spray Commonly known as: FLONASE  Place 2 sprays into both nostrils daily.   furosemide  20 MG tablet Commonly known as: LASIX  Take 1 tablet (20 mg total) by mouth as needed.   ipratropium-albuterol  0.5-2.5 (3) MG/3ML Soln Commonly known as: DUONEB Take 3 mLs by nebulization every 6 (six) hours as needed.   ketoconazole  2 % cream Commonly known as: NIZORAL  Apply 1 Application topically daily.   loratadine  10 MG tablet Commonly known as: CLARITIN  Take 10 mg by mouth daily.   mirtazapine  45 MG tablet Commonly known as: REMERON  Take 45 mg by mouth at bedtime.   multivitamin capsule Take 1 capsule by mouth daily.    nitroGLYCERIN  0.4 MG SL tablet Commonly known as: NITROSTAT  Place 1 tablet (0.4 mg total) under the tongue every 5 (five) minutes as needed.   Ohtuvayre  3 MG/2.5ML Susp Generic drug: Ensifentrine  Inhale 3 mLs into the lungs 2 (two) times daily. DX:J44.9   OXYGEN  Inhale 3 L into the lungs daily.   pantoprazole  40 MG tablet Commonly known as: PROTONIX  Take 40 mg by mouth 2 (two) times daily before a meal.   predniSONE  20 MG tablet Commonly known as: DELTASONE  Take 1 tablet (20 mg total) by mouth daily with breakfast for 3 days.   sacubitril -valsartan  49-51 MG Commonly known as: ENTRESTO  Take 1 tablet by mouth 2 (two) times daily.   spironolactone  25 MG tablet Commonly known as: ALDACTONE  Take 0.5 tablets (12.5 mg total) by mouth daily. What changed: how much to take   sucralfate  1 g tablet Commonly known as: Carafate  Take 1 tablet (1 g total) by mouth 3 (three) times daily with meals. 1 hour before meals and medications        Follow-up Information     Valere Gata, MD Follow up.   Specialty: Family Medicine Why: Office is closed, patient needs to make their own hospital follow up. Contact information: [address] [place] [address] [phone]         [facility] Follow up.   Specialty: Home Health Services Why: Agency will call to schedule the first appointment Contact information: [address] [place] [address] [phone]         Marc Senior, MD. Schedule an appointment as soon as possible for a visit.   Specialty: Pulmonary Disease Why: Office is closed, patient needs to make their own hospital follow up. Contact information: [address] [place] [address] [phone]                Discharge Exam: Leslie Sims Weights   05/14/24 0946  Weight: 86 kg   General.  Frail elderly man, in no acute distress. Pulmonary.  Lungs clear bilaterally, normal respiratory  effort. CV.  Regular rate and rhythm, no JVD, rub or murmur. Abdomen.  Soft, nontender, nondistended, BS positive. CNS.  Alert and oriented .  No focal neurologic deficit. Extremities.  No edema, no cyanosis, pulses intact and symmetrical. Psychiatry.  Judgment and insight appears normal.   Condition at discharge: stable  The results of significant diagnostics from this hospitalization (including imaging, microbiology, ancillary and laboratory) are listed below for reference.   Imaging Studies: DG Chest Portable 1 View Result Date: 05/14/2024 CLINICAL DATA:  SOB, wheezing EXAM: PORTABLE CHEST - 1 VIEW COMPARISON:  05/08/2024. FINDINGS: The heart size and mediastinal contours are within normal limits. Lungs are hyperinflated suggesting COPD. Alveolar consolidation left upper lungs consistent with pneumonia. No pneumothorax  or pleural effusion. Thoracic degenerative disc disease. IMPRESSION: Left upper lung pneumonia. Electronically Signed   By: Sydell Eva M.D.   On: 05/14/2024 10:30   DG Chest 2 View Result Date: 05/08/2024 CLINICAL DATA:  Dyspnea EXAM: CHEST - 2 VIEW COMPARISON:  Chest x-ray 05/05/2024 FINDINGS: There are new patchy airspace opacities in the inferior left upper lobe and anterior left lower lobe. There is no pleural effusion or pneumothorax. There is a calcified granuloma in the right lower lung. The cardiomediastinal silhouette is within normal limits. No acute fractures are seen. IMPRESSION: New patchy airspace opacities in the inferior left upper lobe and anterior left lower lobe, concerning for pneumonia. Electronically Signed   By: Tyron Gallon M.D.   On: 05/08/2024 16:01   DG Chest Port 1 View Result Date: 05/05/2024 CLINICAL DATA:  Chest pain and pressure EXAM: PORTABLE CHEST 1 VIEW COMPARISON:  11/01/2023, 01/26/2024 FINDINGS: Cardiac shadow is within normal limits. Lungs are well aerated bilaterally. No focal infiltrate or effusion is seen. Mild basilar atelectasis  is noted. Some dense material is noted in the right base which is stable from the prior CT and may be related to inspissated material. Partially calcified nodule in the left upper lobe stable from recent CT. IMPRESSION: No acute abnormality noted. Electronically Signed   By: Violeta Grey M.D.   On: 05/05/2024 22:06    Microbiology: Results for orders placed or performed during the hospital encounter of 05/14/24  Resp panel by RT-PCR (RSV, Flu A&B, Covid) Anterior Nasal Swab     Status: None   Collection Time: 05/14/24 10:20 AM   Specimen: Anterior Nasal Swab  Result Value Ref Range Status   SARS Coronavirus 2 by RT PCR NEGATIVE NEGATIVE Final    Comment: (NOTE) SARS-CoV-2 target nucleic acids are NOT DETECTED.  The SARS-CoV-2 RNA is generally detectable in upper respiratory specimens during the acute phase of infection. The lowest concentration of SARS-CoV-2 viral copies this assay can detect is 138 copies/mL. A negative result does not preclude SARS-Cov-2 infection and should not be used as the sole basis for treatment or other patient management decisions. A negative result may occur with  improper specimen collection/handling, submission of specimen other than nasopharyngeal swab, presence of viral mutation(s) within the areas targeted by this assay, and inadequate number of viral copies(<138 copies/mL). A negative result must be combined with clinical observations, patient history, and epidemiological information. The expected result is Negative.  Fact Sheet for Patients:  BloggerCourse.com  Fact Sheet for Healthcare Providers:  SeriousBroker.it  This test is no t yet approved or cleared by the [place]  FDA and  has been authorized for detection and/or diagnosis of SARS-CoV-2 by FDA under an Emergency Use Authorization (EUA). This EUA will remain  in effect (meaning this test can be used) for the duration of the COVID-19  declaration under Section 564(b)(1) of the Act, 21 U.S.C.section 360bbb-3(b)(1), unless the authorization is terminated  or revoked sooner.       Influenza A by PCR NEGATIVE NEGATIVE Final   Influenza B by PCR NEGATIVE NEGATIVE Final    Comment: (NOTE) The Xpert Xpress SARS-CoV-2/FLU/RSV plus assay is intended as an aid in the diagnosis of influenza from Nasopharyngeal swab specimens and should not be used as a sole basis for treatment. Nasal washings and aspirates are unacceptable for Xpert Xpress SARS-CoV-2/FLU/RSV testing.  Fact Sheet for Patients: BloggerCourse.com  Fact Sheet for Healthcare Providers: SeriousBroker.it  This test is not yet approved or cleared by the  [place]  FDA and has been authorized for detection and/or diagnosis of SARS-CoV-2 by FDA under an Emergency Use Authorization (EUA). This EUA will remain in effect (meaning this test can be used) for the duration of the COVID-19 declaration under Section 564(b)(1) of the Act, 21 U.S.C. section 360bbb-3(b)(1), unless the authorization is terminated or revoked.     Resp Syncytial Virus by PCR NEGATIVE NEGATIVE Final    Comment: (NOTE) Fact Sheet for Patients: BloggerCourse.com  Fact Sheet for Healthcare Providers: SeriousBroker.it  This test is not yet approved or cleared by the [place]  FDA and has been authorized for detection and/or diagnosis of SARS-CoV-2 by FDA under an Emergency Use Authorization (EUA). This EUA will remain in effect (meaning this test can be used) for the duration of the COVID-19 declaration under Section 564(b)(1) of the Act, 21 U.S.C. section 360bbb-3(b)(1), unless the authorization is terminated or revoked.  Performed at [facility], [address] [place], [place] [address]   Blood culture (routine x 2)     Status: Abnormal (Preliminary result)   Collection  Time: 05/14/24 12:20 PM   Specimen: BLOOD  Result Value Ref Range Status   Specimen Description   Final    BLOOD RIGHT ANTECUBITAL Performed at [facility], [address], [place], [place] [address]    Special Requests   Final    BOTTLES DRAWN AEROBIC AND ANAEROBIC Blood Culture results may not be optimal due to an inadequate volume of blood received in culture bottles Performed at [facility], [address] [place], [place] [address]    Culture  Setup Time   Final    GRAM POSITIVE RODS CRITICAL RESULT CALLED TO, READ BACK BY AND VERIFIED WITH: ELVIRA MADUME PHARMD 0840 05/17/24 HNM GRAM STAIN REVIEWED-AGREE WITH RESULT DRT    Culture (A)  Final    CORYNEBACTERIUM, GROUP JK STAPHYLOCOCCUS EPIDERMIDIS THE SIGNIFICANCE OF ISOLATING THIS ORGANISM FROM A SINGLE SET OF BLOOD CULTURES WHEN MULTIPLE SETS ARE DRAWN IS UNCERTAIN. PLEASE NOTIFY THE MICROBIOLOGY DEPARTMENT WITHIN ONE WEEK IF SPECIATION AND SENSITIVITIES ARE REQUIRED. CALL MICROBIOLOGY LAB IF SENSITIVITIES ARE REQUIRED. FOR CORYNEBACTERIUM, GROUP JK Performed at [facility], [address], [place], [place] [address]    Report Status PENDING  Incomplete  Blood culture (routine x 2)     Status: None (Preliminary result)   Collection Time: 05/14/24 12:20 PM   Specimen: BLOOD LEFT HAND  Result Value Ref Range Status   Specimen Description   Final    BLOOD LEFT HAND Performed at [facility], [address], [place], [place] [address]    Special Requests   Final    BOTTLES DRAWN AEROBIC AND ANAEROBIC Blood Culture results may not be optimal due to an inadequate volume of blood received in culture bottles Performed at [facility], [address] [place], [place] [address]    Culture  Setup Time   Final    GRAM POSITIVE RODS AEROBIC BOTTLE ONLY CRITICAL VALUE NOTED.  VALUE IS CONSISTENT WITH PREVIOUSLY REPORTED AND CALLED VALUE. Performed at [facility], [address], [place], [place] [address]    Culture   Final    Hillis Lu POSITIVE RODS IDENTIFICATION TO FOLLOW Performed at [facility], [address], [place], [place] [address]    Report Status PENDING  Incomplete  C Difficile Quick Screen w PCR reflex     Status: None   Collection Time: 05/15/24 10:26 AM  Specimen: STOOL  Result Value Ref Range Status   C Diff antigen NEGATIVE NEGATIVE Final   C Diff toxin NEGATIVE NEGATIVE Final   C Diff interpretation No C. difficile detected.  Final    Comment: Performed at [facility], [address] [place], [place] [address]  Gastrointestinal Panel by PCR , Stool     Status: None   Collection Time: 05/15/24 10:26 AM   Specimen: STOOL  Result Value Ref Range Status   Campylobacter species NOT DETECTED NOT DETECTED Final   Plesimonas shigelloides NOT DETECTED NOT DETECTED Final   Salmonella species NOT DETECTED NOT DETECTED Final   Yersinia enterocolitica NOT DETECTED NOT DETECTED Final   Vibrio species NOT DETECTED NOT DETECTED Final   Vibrio cholerae NOT DETECTED NOT DETECTED Final   Enteroaggregative E coli (EAEC) NOT DETECTED NOT DETECTED Final   Enteropathogenic E coli (EPEC) NOT DETECTED NOT DETECTED Final   Enterotoxigenic E coli (ETEC) NOT DETECTED NOT DETECTED Final   Shiga like toxin producing E coli (STEC) NOT DETECTED NOT DETECTED Final   Shigella/Enteroinvasive E coli (EIEC) NOT DETECTED NOT DETECTED Final   Cryptosporidium NOT DETECTED NOT DETECTED Final   Cyclospora cayetanensis NOT DETECTED NOT DETECTED Final   Entamoeba histolytica NOT DETECTED NOT DETECTED Final   Giardia lamblia NOT DETECTED NOT DETECTED Final   Adenovirus F40/41 NOT DETECTED NOT DETECTED Final   Astrovirus NOT DETECTED NOT DETECTED Final   Norovirus GI/GII NOT DETECTED NOT DETECTED Final   Rotavirus A NOT DETECTED NOT DETECTED Final   Sapovirus (I, II, IV, and V) NOT DETECTED NOT DETECTED Final    Comment: Performed at [facility],  [address] [place], [place] [address]  Respiratory (~20 pathogens) panel by PCR     Status: Abnormal   Collection Time: 05/18/24  1:19 PM   Specimen: Nasopharyngeal Swab; Respiratory  Result Value Ref Range Status   Adenovirus NOT DETECTED NOT DETECTED Final   Coronavirus 229E NOT DETECTED NOT DETECTED Final    Comment: (NOTE) The Coronavirus on the Respiratory Panel, DOES NOT test for the novel  Coronavirus (2019 nCoV)    Coronavirus HKU1 NOT DETECTED NOT DETECTED Final   Coronavirus NL63 NOT DETECTED NOT DETECTED Final   Coronavirus OC43 NOT DETECTED NOT DETECTED Final   Metapneumovirus NOT DETECTED NOT DETECTED Final   Rhinovirus / Enterovirus DETECTED (A) NOT DETECTED Final   Influenza A NOT DETECTED NOT DETECTED Final   Influenza B NOT DETECTED NOT DETECTED Final   Parainfluenza Virus 1 NOT DETECTED NOT DETECTED Final   Parainfluenza Virus 2 NOT DETECTED NOT DETECTED Final   Parainfluenza Virus 3 NOT DETECTED NOT DETECTED Final   Parainfluenza Virus 4 NOT DETECTED NOT DETECTED Final   Respiratory Syncytial Virus NOT DETECTED NOT DETECTED Final   Bordetella pertussis NOT DETECTED NOT DETECTED Final   Bordetella Parapertussis NOT DETECTED NOT DETECTED Final   Chlamydophila pneumoniae NOT DETECTED NOT DETECTED Final   Mycoplasma pneumoniae NOT DETECTED NOT DETECTED Final    Comment: Performed at [facility], [address], [place], [place] [address]   *Note: Due to a large number of results and/or encounters for the requested time period, some results have not been displayed. A complete set of results can be found in Results Review.    Labs: CBC: Recent Labs  Lab 05/14/24 0950 05/15/24 0453 05/18/24 1328  WBC 14.8* 11.2* 17.4*  NEUTROABS 11.9*  --  16.3*  HGB 12.0* 11.0* 12.1*  HCT 38.4*  34.8* 39.1  MCV 89.5 87.2 88.9  PLT 423* 340 388   Basic Metabolic Panel: Recent Labs  Lab 05/14/24 0950 05/15/24 0453  NA 138 140  K 4.1 4.0  CL 98 101  CO2 25  26  GLUCOSE 89 168*  BUN 19 19  CREATININE 0.81 0.74  CALCIUM  7.9* 8.6*   Liver Function Tests: No results for input(s): "AST", "ALT", "ALKPHOS", "BILITOT", "PROT", "ALBUMIN" in the last 168 hours. CBG: Recent Labs  Lab 05/18/24 0756 05/18/24 1115 05/18/24 1628 05/18/24 2122 05/19/24 0741  GLUCAP 109* 110* 141* 118* 91    Discharge time spent: greater than 30 minutes.  This record has been created using Conservation officer, historic buildings. Errors have been sought and corrected,but may not always be located. Such creation errors do not reflect on the standard of care.   Signed: Luna Salinas, MD [facility] 05/19/2024

## 2024-05-20 LAB — CULTURE, BLOOD (ROUTINE X 2)

## 2024-05-21 LAB — CULTURE, BLOOD (ROUTINE X 2)

## 2024-05-23 LAB — FUNGITELL BETA-D-GLUCAN
Fungitell Value:: <31.25 pg/mL
Result Name:: NEGATIVE

## 2024-05-23 LAB — CULTURE, BLOOD (ROUTINE X 2)
Culture: NO GROWTH
Culture: NO GROWTH
Special Requests: ADEQUATE
Special Requests: ADEQUATE

## 2024-06-10 ENCOUNTER — Emergency Department

## 2024-06-10 ENCOUNTER — Emergency Department
Admission: EM | Admit: 2024-06-10 | Discharge: 2024-06-10 | Disposition: A | Discharge status: Home / Self Care | Attending: Emergency Medicine | Admitting: Emergency Medicine

## 2024-06-10 ENCOUNTER — Other Ambulatory Visit: Payer: Self-pay

## 2024-06-10 DIAGNOSIS — I2510 Atherosclerotic heart disease of native coronary artery without angina pectoris: Secondary | ICD-10-CM | POA: Diagnosis not present

## 2024-06-10 DIAGNOSIS — J449 Chronic obstructive pulmonary disease, unspecified: Secondary | ICD-10-CM | POA: Diagnosis not present

## 2024-06-10 DIAGNOSIS — J181 Lobar pneumonia, unspecified organism: Secondary | ICD-10-CM | POA: Insufficient documentation

## 2024-06-10 DIAGNOSIS — R059 Cough, unspecified: Secondary | ICD-10-CM | POA: Diagnosis present

## 2024-06-10 DIAGNOSIS — I509 Heart failure, unspecified: Secondary | ICD-10-CM | POA: Diagnosis not present

## 2024-06-10 DIAGNOSIS — J42 Unspecified chronic bronchitis: Secondary | ICD-10-CM

## 2024-06-10 DIAGNOSIS — K219 Gastro-esophageal reflux disease without esophagitis: Secondary | ICD-10-CM | POA: Diagnosis present

## 2024-06-10 DIAGNOSIS — J189 Pneumonia, unspecified organism: Secondary | ICD-10-CM | POA: Diagnosis not present

## 2024-06-10 DIAGNOSIS — I110 Hypertensive heart disease with heart failure: Secondary | ICD-10-CM | POA: Diagnosis not present

## 2024-06-10 DIAGNOSIS — Z8501 Personal history of malignant neoplasm of esophagus: Secondary | ICD-10-CM | POA: Diagnosis not present

## 2024-06-10 DIAGNOSIS — E119 Type 2 diabetes mellitus without complications: Secondary | ICD-10-CM

## 2024-06-10 DIAGNOSIS — I5042 Chronic combined systolic (congestive) and diastolic (congestive) heart failure: Secondary | ICD-10-CM | POA: Diagnosis present

## 2024-06-10 HISTORY — DX: Anxiety disorder, unspecified: F41.9

## 2024-06-10 HISTORY — DX: Depression, unspecified: F32.A

## 2024-06-10 LAB — PROCALCITONIN: Procalcitonin: <0.1 ng/mL

## 2024-06-10 LAB — RESP PANEL BY RT-PCR (RSV, FLU A&B, COVID)  RVPGX2
Influenza A by PCR: NEGATIVE
Influenza B by PCR: NEGATIVE
Resp Syncytial Virus by PCR: NEGATIVE
SARS Coronavirus 2 by RT PCR: NEGATIVE

## 2024-06-10 LAB — CBC
HCT: 40 % (ref 39.0–52.0)
Hemoglobin: 12.1 g/dL — ABNORMAL LOW (ref 13.0–17.0)
MCH: 28.1 pg (ref 26.0–34.0)
MCHC: 30.3 g/dL (ref 30.0–36.0)
MCV: 93 fL (ref 80.0–100.0)
Platelets: 294 10*3/uL (ref 150–400)
RBC: 4.3 MIL/uL (ref 4.22–5.81)
RDW: 15.7 % — ABNORMAL HIGH (ref 11.5–15.5)
WBC: 12.6 10*3/uL — ABNORMAL HIGH (ref 4.0–10.5)
nRBC: 0 % (ref 0.0–0.2)

## 2024-06-10 LAB — BASIC METABOLIC PANEL WITH GFR
Anion gap: 13 (ref 5–15)
BUN: 15 mg/dL (ref 8–23)
CO2: 27 mmol/L (ref 22–32)
Calcium: 8.6 mg/dL — ABNORMAL LOW (ref 8.9–10.3)
Chloride: 96 mmol/L — ABNORMAL LOW (ref 98–111)
Creatinine, Ser: 1.05 mg/dL (ref 0.61–1.24)
GFR, Estimated: >60 mL/min (ref >60)
Glucose, Bld: 192 mg/dL — ABNORMAL HIGH (ref 70–99)
Potassium: 3.9 mmol/L (ref 3.5–5.1)
Sodium: 136 mmol/L (ref 135–145)

## 2024-06-10 LAB — TROPONIN I (HIGH SENSITIVITY): Troponin I (High Sensitivity): 9 ng/L (ref <18)

## 2024-06-10 MED ORDER — SODIUM CHLORIDE 0.9 % IV SOLN
500.0000 mg | Freq: Once | INTRAVENOUS | Status: DC
  Filled 2024-06-10: qty 5

## 2024-06-10 MED ORDER — SODIUM CHLORIDE 0.9 % IV SOLN
2.0000 g | Freq: Once | INTRAVENOUS | Status: AC
  Administered 2024-06-10: 2 g via INTRAVENOUS
  Filled 2024-06-10: qty 12.5

## 2024-06-10 MED ORDER — PREDNISONE 20 MG PO TABS: 60.0000 mg | ORAL_TABLET | Freq: Every day | ORAL | 0 refills | Status: AC

## 2024-06-10 MED ORDER — VANCOMYCIN HCL IN DEXTROSE 1-5 GM/200ML-% IV SOLN: 1000.0000 mg | Freq: Once | INTRAVENOUS | Status: DC

## 2024-06-10 MED ORDER — IPRATROPIUM-ALBUTEROL 0.5-2.5 (3) MG/3ML IN SOLN
3.0000 mL | Freq: Once | RESPIRATORY_TRACT | Status: AC
  Administered 2024-06-10: 3 mL via RESPIRATORY_TRACT
  Filled 2024-06-10: qty 3

## 2024-06-10 MED ORDER — IOHEXOL 350 MG/ML SOLN
75.0000 mL | Freq: Once | INTRAVENOUS | Status: AC | PRN
  Administered 2024-06-10: 75 mL via INTRAVENOUS

## 2024-06-10 MED ORDER — LEVOFLOXACIN 750 MG PO TABS: 750.0000 mg | ORAL_TABLET | Freq: Every day | ORAL | 0 refills | Status: AC

## 2024-06-10 NOTE — ED Provider Notes (Signed)
 [facility] Provider Note    Event Date/Time   First MD Initiated Contact with Patient 06/10/24 [phone]     (approximate)   History   Chief Complaint Chest Pain   HPI  Leslie Sims is a 73 y.o. male with past medical history of hypertension, diabetes, CAD, COPD, chronic hypoxic respiratory failure on 3 L, CHF, esophageal cancer, and anemia who presents to the ED complaining of chest pain.  Patient reports that he has had 2 days of increasing right-sided chest pain with worsening difficulty breathing compared to his baseline.  He describes the pain as a dull ache that is worse when he takes a deep breath or coughs.  He has a chronic cough that has been slightly worse than usual, productive of yellowish sputum.  He was admitted to the hospital for pneumonia last month and states symptoms today are similar to that time.  He has not noticed any pain or swelling in his legs, denies any fevers.     Physical Exam   Triage Vital Signs: ED Triage Vitals  Encounter Vitals Group     BP 06/10/24 0946 (!) 128/101     Girls Systolic BP Percentile --      Girls Diastolic BP Percentile --      Boys Systolic BP Percentile --      Boys Diastolic BP Percentile --      Pulse Rate 06/10/24 0946 84     Resp 06/10/24 0946 (!) 22     Temp 06/10/24 0946 98 F (36.7 C)     Temp src --      SpO2 06/10/24 0946 (!) 86 %     Weight 06/10/24 0946 192 lb (87.1 kg)     Height 06/10/24 0946 5' 10 (1.778 m)     Head Circumference --      Peak Flow --      Pain Score 06/10/24 0945 5     Pain Loc --      Pain Education --      Exclude from Growth Chart --     Most recent vital signs: Vitals:   06/10/24 0946 06/10/24 1237  BP: (!) 128/101 100/69  Pulse: 84 73  Resp: (!) 22 19  Temp: 98 F (36.7 C) 98.3 F (36.8 C)  SpO2: (!) 86% 100%    Constitutional: Alert and oriented. Eyes: Conjunctivae are normal. Head: Atraumatic. Nose: No  congestion/rhinnorhea. Mouth/Throat: Mucous membranes are moist.  Cardiovascular: Normal rate, regular rhythm. Grossly normal heart sounds.  2+ radial pulses bilaterally. Respiratory: Mild tachypnea with normal respiratory effort.  No retractions. Lungs with wheezing and rhonchi throughout. Gastrointestinal: Soft and nontender. No distention. Musculoskeletal: No lower extremity tenderness nor edema.  Neurologic:  Normal speech and language. No gross focal neurologic deficits are appreciated.    ED Results / Procedures / Treatments   Labs (all labs ordered are listed, but only abnormal results are displayed) Labs Reviewed  BASIC METABOLIC PANEL WITH GFR - Abnormal; Notable for the following components:      Result Value   Chloride 96 (*)    Glucose, Bld 192 (*)    Calcium  8.6 (*)    All other components within normal limits  CBC - Abnormal; Notable for the following components:   WBC 12.6 (*)    Hemoglobin 12.1 (*)    RDW 15.7 (*)    All other components within normal limits  RESP PANEL BY RT-PCR (RSV, FLU A&B, COVID)  [id number]  PROCALCITONIN  TROPONIN I (HIGH SENSITIVITY)     EKG  ED ECG REPORT I, Carlin Palin, the attending physician, personally viewed and interpreted this ECG.   Date: 06/10/2024  EKG Time: 9:46  Rate: 83  Rhythm: normal sinus rhythm  Axis: Normal  Intervals:none  ST&T Change: None  RADIOLOGY CTA chest reviewed and interpreted by me with bilateral upper lobe infiltrates, no evidence of pulmonary embolism.  PROCEDURES:  Critical Care performed: No  Procedures   MEDICATIONS ORDERED IN ED: Medications  vancomycin  (VANCOCIN ) IVPB 1000 mg/200 mL premix (has no administration in time range)  azithromycin  (ZITHROMAX ) 500 mg in sodium chloride  0.9 % 250 mL IVPB (has no administration in time range)  ipratropium-albuterol  (DUONEB) 0.5-2.5 (3) MG/3ML nebulizer solution 3 mL (3 mLs Nebulization Given 06/10/24 1018)  iohexol  (OMNIPAQUE ) 350 MG/ML  injection 75 mL (75 mLs Intravenous Contrast Given 06/10/24 1151)  ceFEPIme  (MAXIPIME ) 2 g in sodium chloride  0.9 % 100 mL IVPB (2 g Intravenous New Bag/Given 06/10/24 1313)     IMPRESSION / MDM / ASSESSMENT AND PLAN / ED COURSE  I reviewed the triage vital signs and the nursing notes.                              73 y.o. male with past medical history of hypertension, diabetes, CAD, COPD, chronic hypoxic respiratory failure on 3 L, CHF, esophageal cancer, and anemia who presents to the ED with 2 days of productive cough, right-sided chest pain, and increasing difficulty breathing compared to his baseline.  Patient's presentation is most consistent with acute presentation with potential threat to life or bodily function.  Differential diagnosis includes, but is not limited to, ACS, PE, pneumonia, pneumothorax, bronchitis, COVID-19, influenza, anemia, electrolyte abnormality, AKI.  Patient chronically ill but nontoxic-appearing and in no acute distress, he is mildly tachypneic without significant increase in his work of breathing, noted to have oxygen  saturation of 86% on 3 L in triage but now improved to 96% at the time of my evaluation.  EKG shows no evidence of arrhythmia or ischemia, symptoms seem atypical for ACS but troponin is pending at this time.  We will further assess with CTA of his chest for recurrent pneumonia versus pulmonary embolism.  Additional labs are also pending.  CTA is negative for PE, does show bilateral upper lobe infiltrates concerning for pneumonia.  Labs with mild leukocytosis but otherwise no findings concerning for sepsis, he continues to maintain oxygen  saturations on his usual 3 L nasal cannula.  No significant anemia, electrolyte abnormality, or AKI noted and troponin within normal limits.  He was given broad-spectrum IV antibiotic and evaluated by the hospitalist, but stated that he was feeling better and wanted to go home.  We will prescribe prednisone  and Levaquin ,  he was counseled to follow-up with his pulmonologist and to return to the ED for new or worsening symptoms.  Patient agrees with plan.      FINAL CLINICAL IMPRESSION(S) / ED DIAGNOSES   Final diagnoses:  Pneumonia of both upper lobes due to infectious organism  Chronic bronchitis, unspecified chronic bronchitis type (HCC)     Rx / DC Orders   ED Discharge Orders          Ordered    predniSONE  (DELTASONE ) 20 MG tablet  Daily with breakfast        06/10/24 1357    levofloxacin  (LEVAQUIN ) 750 MG tablet  Daily  06/10/24 1357             Note:  This document was prepared using Dragon voice recognition software and may include unintentional dictation errors.   Willo Dunnings, MD 06/10/24 [phone]

## 2024-06-10 NOTE — ED Triage Notes (Signed)
 Pt comes with cp that started on Friday on his right side. Pt states it has now raveled to his left side. Pt recently was in hospital for pneumonia. Pt wears 3-4L chronically. Pt states some sob.

## 2024-06-10 NOTE — Consult Note (Addendum)
 ER Consult Note   Patient: Leslie Sims [facility] FMW:[id number] DOB: 09-Sep-1951 DOA: 06/10/2024 DOS: the patient was seen and examined on 06/10/2024 PCP: Claudene Rayfield HERO, MD  Patient coming from: Home - lives alone; NOK: Warren and Ruby, daughters   Chief Complaint: Cough, Pleuritic CP  HPI: Leslie Sims is a 73 y.o. male with medical history significant of prostate CA, chronic HFrEF, CAD s/p stent, mood d/o, DM, COPD on home O2, HTN, and HLD who presented on 6/22 with CP.  He was last hospitalized from 5/26-31 with sepsis due to PNA.  He has done fairly well since last dc.  Therapy has been coming in.  He started feeling bad on Friday, developed some substernal CP Friday night.  He sleeps in a recliner and he got up, no improvement. Worse with coughing. Last night it happened again.  He awaited it out and decided to come in here.  Cough is different from usual, mild phlegm, uncertain about color.  No sick contacts.  Has not had to increase O2.    ER Course:  Recent PNA.  Here with increasing R-sided CP, cough.  CTA negative for PE but concern for worsening PNA.       Review of Systems: As mentioned in the history of present illness. All other systems reviewed and are negative. Past Medical History:  Diagnosis Date   Allergic rhinitis    Anxiety and depression    Back pain    Cancer (HCC)    prostate   Colon polyps 08/2012   colonoscopy; multiple colon polyps; repeat colonoscopy in one year.   COPD (chronic obstructive pulmonary disease) (HCC)    a. on home O2.   Coronary artery disease 11/2009   a. late presenting ant MI; b. LHC 100% pLAD s/p PCI/DES, 99% mRCA s/p PCI/DES, EF 35%; c. nuclear stress test 05/13: prior ant/inf infarcts w/o ischemia, EF 43%; d. LHC 02/14: widely patent stents with no other obs dz, EF 40%; e. LHC 11/17: LM nl, mLAD 10%, patent LAD stent, dLAD 20%, p-mRCA 10%, mRCA 40%, patent RCA stent, EF 35-45%    Diabetes (HCC)    Dysphagia    GERD  (gastroesophageal reflux disease)    Headache(784.0)    Helicobacter pylori (H. pylori)    Hernia    inguinal   HFrEF (heart failure with reduced ejection fraction) (HCC)    a. 10/2016 LV gram: EF 35-45%; b. 06/2018 Echo: EF 40-45%. c. 01/2021 Echo: EF 40-45%   Hyperlipidemia    Hypertension    Insomnia    Myalgia    Peptic ulcer    Pulmonary nodules    Status post dilation of esophageal narrowing 2000   Thickening of esophagus    Tinea pedis    Wears dentures    partial upper   Past Surgical History:  Procedure Laterality Date    coronary stents     Admission  12/20/2012   COPD exacerbation.  [facility].   CARDIAC CATHETERIZATION     CARDIAC CATHETERIZATION  02/05/2013   [facility]   CARDIAC CATHETERIZATION  09/19/2013   [facility] : patent stents with no change in anatomy. EF: 40$   CARDIAC CATHETERIZATION Left 11/12/2016   Procedure: Left Heart Cath and Coronary Angiography;  Surgeon: Deatrice DELENA Cage, MD;  Location: [facility] INVASIVE CV LAB;  Service: Cardiovascular;  Laterality: Left;   COLONOSCOPY     COLONOSCOPY WITH PROPOFOL  N/A 05/18/2016   Procedure: COLONOSCOPY WITH PROPOFOL ;  Surgeon: Rogelia Copping, MD;  Location: [facility]  ENDOSCOPY;  Service: Endoscopy;  Laterality: N/A;   COLONOSCOPY WITH PROPOFOL  N/A 03/19/2020   Procedure: COLONOSCOPY WITH PROPOFOL ;  Surgeon: Unk Corinn Skiff, MD;  Location: [facility] ENDOSCOPY;  Service: Gastroenterology;  Laterality: N/A;   CORONARY ANGIOPLASTY WITH STENT PLACEMENT  09/19/2009   LAD 3.0 X23 mm Xience DES, RCA: 4.0 X 15 mm Xience DES   ELECTROMAGNETIC NAVIGATION BROCHOSCOPY N/A 10/27/2017   Procedure: ELECTROMAGNETIC NAVIGATION BRONCHOSCOPY;  Surgeon: Isaiah Scrivener, MD;  Location: [facility] ORS;  Service: Cardiopulmonary;  Laterality: N/A;   ESOPHAGEAL DILATION     ESOPHAGOGASTRODUODENOSCOPY  12/20/2006   ESOPHAGOGASTRODUODENOSCOPY  08/20/2012   ESOPHAGOGASTRODUODENOSCOPY (EGD) WITH PROPOFOL  N/A 04/19/2022   Procedure: ESOPHAGOGASTRODUODENOSCOPY (EGD) WITH  PROPOFOL ;  Surgeon: Maryruth Ole DASEN, MD;  Location: [facility] ENDOSCOPY;  Service: Endoscopy;  Laterality: N/A;   ESOPHAGOGASTRODUODENOSCOPY (EGD) WITH PROPOFOL  N/A 04/27/2022   Procedure: ESOPHAGOGASTRODUODENOSCOPY (EGD) WITH PROPOFOL ;  Surgeon: Maryruth Ole DASEN, MD;  Location: [facility] ENDOSCOPY;  Service: Endoscopy;  Laterality: N/A;   ESOPHAGOGASTRODUODENOSCOPY (EGD) WITH PROPOFOL  N/A 11/08/2022   Procedure: ESOPHAGOGASTRODUODENOSCOPY (EGD) WITH PROPOFOL ;  Surgeon: Maryruth Ole DASEN, MD;  Location: [facility] ENDOSCOPY;  Service: Endoscopy;  Laterality: N/A;   EUS N/A 05/20/2022   Procedure: UPPER ENDOSCOPIC ULTRASOUND (EUS) LINEAR;  Surgeon: Elta Fonda SQUIBB, MD;  Location: [facility] ENDOSCOPY;  Service: Gastroenterology;  Laterality: N/A;  [facility]   HERNIA REPAIR  07/20/2012   L inguinal hernia repair   PENILE PROSTHESIS IMPLANT     RIGHT/LEFT HEART CATH AND CORONARY ANGIOGRAPHY Bilateral 04/04/2023   Procedure: RIGHT/LEFT HEART CATH AND CORONARY ANGIOGRAPHY;  Surgeon: Darron Deatrice LABOR, MD;  Location: [facility] INVASIVE CV LAB;  Service: Cardiovascular;  Laterality: Bilateral;   Social History:  reports that he quit smoking about 5 years ago. His smoking use included cigarettes. He started smoking about 47 years ago. He has a 126 pack-year smoking history. He has never used smokeless tobacco. He reports that he does not drink alcohol and does not use drugs.  Allergies  Allergen Reactions   Prozac  [Fluoxetine  Hcl] Shortness Of Breath   Hydroxyzine     Bupropion Other (See Comments)    Makes me feel funny Other reaction(s): Other (See Comments) Unknown/made me feel real funny Makes me feel funny Makes me feel funny Other reaction(s): Other (See Comments) Unknown/made me feel real funny Makes me feel funny   Effexor Xr [Venlafaxine Hcl Er] Other (See Comments)    Makes me feel funny    Family History  Problem Relation Age of Onset   Heart attack Brother        Brother #1   Diabetes  Brother    Hypertension Brother        #3   Coronary artery disease Father 29       deceased   Heart attack Father    Diabetes Father    Heart disease Father    COPD Mother 60       deceased   Alcohol abuse Sister        polysubstance abuse   COPD Sister    Lung cancer Sister    Alcohol abuse Sister        polysubstance abuse   Penile cancer Brother    Diabetes Brother    Prostate cancer Neg Hx    Bladder Cancer Neg Hx    Kidney cancer Neg Hx     Prior to Admission medications   Medication Sig Start Date End Date Taking? Authorizing Provider  albuterol  (VENTOLIN  HFA) 108 (90 Base) MCG/ACT  inhaler TAKE 2 PUFFS BY MOUTH EVERY 6 HOURS AS NEEDED FOR WHEEZE OR SHORTNESS OF BREATH 03/06/24   Tamea Dedra CROME, MD  ALPRAZolam  (XANAX ) 0.5 MG tablet Take 0.5 mg by mouth 3 (three) times daily. 03/16/23   [provider]  aspirin  81 MG tablet Take 81 mg by mouth daily.    [provider]  atorvastatin  (LIPITOR) 80 MG tablet Take 1 tablet (80 mg total) by mouth daily. 03/18/21   Dunn, Bernardino HERO, PA-C  bisoprolol  (ZEBETA ) 5 MG tablet TAKE 1/2 TABLET BY MOUTH DAILY 01/20/24   McLean, Dalton S, MD  budesonide  (PULMICORT ) 0.5 MG/2ML nebulizer solution Take 2 mLs (0.5 mg total) by nebulization in the morning and at bedtime. 06/07/23   Tamea Dedra CROME, MD  Ensifentrine  (OHTUVAYRE ) 3 MG/2.5ML SUSP Inhale 3 mLs into the lungs 2 (two) times daily. DX:J44.9 Patient not taking: Reported on 05/15/2024 08/25/23   Malachy Comer GAILS, NP  ezetimibe  (ZETIA ) 10 MG tablet Take 1 tablet (10 mg total) by mouth daily. 03/23/24 06/21/24  Rolan Ezra RAMAN, MD  famotidine  (PEPCID ) 20 MG tablet TAKE 1 TABLET (20 MG TOTAL) BY MOUTH 2 (TWO) TIMES DAILY. FOR BREAKTHROUGH REFLUX 04/16/24   Tamea Dedra CROME, MD  FARXIGA  10 MG TABS tablet TAKE 1 TABLET BY MOUTH DAILY BEFORE BREAKFAST. 11/08/23   Rolan Ezra RAMAN, MD  FLUoxetine  (PROZAC ) 20 MG tablet Take 20 mg by mouth daily. 02/04/24   [provider]   fluticasone  (FLONASE ) 50 MCG/ACT nasal spray Place 2 sprays into both nostrils daily. 06/08/21   [provider]  furosemide  (LASIX ) 20 MG tablet Take 1 tablet (20 mg total) by mouth as needed. 06/27/23   Rolan Ezra RAMAN, MD  ipratropium-albuterol  (DUONEB) 0.5-2.5 (3) MG/3ML SOLN Take 3 mLs by nebulization every 6 (six) hours as needed. 03/06/24   Tamea Dedra CROME, MD  ketoconazole  (NIZORAL ) 2 % cream Apply 1 Application topically daily. 09/10/22   [provider]  loratadine  (CLARITIN ) 10 MG tablet Take 10 mg by mouth daily. 06/08/21   [provider]  mirtazapine  (REMERON ) 45 MG tablet Take 45 mg by mouth at bedtime. 06/29/22   [provider]  Multiple Vitamin (MULTIVITAMIN) capsule Take 1 capsule by mouth daily.    [provider]  nitroGLYCERIN  (NITROSTAT ) 0.4 MG SL tablet Place 1 tablet (0.4 mg total) under the tongue every 5 (five) minutes as needed. 07/28/22   Furth, Cadence H, PA-C  OXYGEN  Inhale 3 L into the lungs daily.    [provider]  pantoprazole  (PROTONIX ) 40 MG tablet Take 40 mg by mouth 2 (two) times daily before a meal. 01/26/24 01/25/25  [provider]  sacubitril -valsartan  (ENTRESTO ) 49-51 MG Take 1 tablet by mouth 2 (two) times daily. 03/20/24   Rolan Ezra RAMAN, MD  Spacer/Aero-Holding Chambers (AEROCHAMBER MV) inhaler Use as instructed 05/04/21   Bernardino Ditch, NP  spironolactone  (ALDACTONE ) 25 MG tablet Take 0.5 tablets (12.5 mg total) by mouth daily. 05/19/24 08/17/24  Caleen Qualia, MD  sucralfate  (CARAFATE ) 1 g tablet Take 1 tablet (1 g total) by mouth 3 (three) times daily with meals. 1 hour before meals and medications 08/05/23   Cobb, Comer GAILS, NP  prochlorperazine  (COMPAZINE ) 10 MG tablet Take 1 tablet (10 mg total) by mouth every 6 (six) hours as needed (Nausea or vomiting). 05/31/22 08/29/22  Babara Call, MD    Physical Exam: Vitals:   06/10/24 0946 06/10/24 1237  BP: (!) 128/101 100/69  Pulse: 84 73  Resp: ROLLEN)  22 19  Temp: 98 F (36.7 C) 98.3 F (36.8 C)  TempSrc:  Oral  SpO2: (!) 86% 100%  Weight: 87.1 kg   Height: 5' 10 (1.778 m)    General:  Appears calm and comfortable and is in NAD, on home O2 Eyes:  PERRL, EOMI, normal lids, iris ENT:  grossly normal hearing, lips & tongue, mmm Neck:  no LAD, masses or thyromegaly Cardiovascular:  RRR. No LE edema.  Respiratory:   Scattered rhonchi.  Normal respiratory effort. Abdomen:  soft, NT, ND Skin:  no rash or induration seen on limited exam Musculoskeletal:  grossly normal tone BUE/BLE, good ROM, no bony abnormality Psychiatric:  grossly normal mood and affect, speech fluent and appropriate, AOx3 Neurologic:  CN 2-12 grossly intact, moves all extremities in coordinated fashion   Radiological Exams on Admission: Independently reviewed - see discussion in A/P where applicable  CT Angio Chest PE W/Cm &/Or Wo Cm Result Date: 06/10/2024 CLINICAL DATA:  Migratory chest pain, now on the left associated with mild shortness of breath. EXAM: CT ANGIOGRAPHY CHEST WITH CONTRAST TECHNIQUE: Multidetector CT imaging of the chest was performed using the standard protocol during bolus administration of intravenous contrast. Multiplanar CT image reconstructions and MIPs were obtained to evaluate the vascular anatomy. RADIATION DOSE REDUCTION: This exam was performed according to the departmental dose-optimization program which includes automated exposure control, adjustment of the mA and/or kV according to patient size and/or use of iterative reconstruction technique. CONTRAST:  75mL OMNIPAQUE  IOHEXOL  350 MG/ML SOLN COMPARISON:  Chest radiograph dated 05/14/2024, CT chest dated 01/26/2024 FINDINGS: Cardiovascular: The study is high quality for the evaluation of pulmonary embolism. There are no filling defects in the central, lobar, segmental or subsegmental pulmonary artery branches to suggest acute pulmonary embolism. Great vessels are normal in course and caliber.  Normal heart size. No significant pericardial fluid/thickening. Coronary artery calcifications and aortic atherosclerosis. Mediastinum/Nodes: Imaged thyroid gland without nodules meeting criteria for imaging follow-up by size. Normal esophagus. No pathologically enlarged axillary, supraclavicular, mediastinal, or hilar lymph nodes. Lungs/Pleura: The central airways are patent. Layering secretions within the trachea extending into bilateral airways. Moderate diffuse bronchial wall thickening with multifocal subsegmental mucous plugging. Irregular consolidation within the bilateral lung apices and anterior upper lobes. Moderate centrilobular emphysema. Unchanged centrally calcified left upper lobe 8 x 6 mm nodule (5:31) and 3 mm superior segment left lower lobe nodule (5:42). No pneumothorax. No pleural effusion. Upper abdomen: Punctate calcified splenic granulomata. Partially imaged left adrenal nodule, better evaluated on prior examinations. Musculoskeletal: No acute or abnormal lytic or blastic osseous lesions. Multilevel degenerative changes of the thoracic spine. Review of the MIP images confirms the above findings. IMPRESSION: 1. No evidence of pulmonary embolism. 2. Irregular consolidation within the bilateral lung apices and anterior upper lobes, likely multifocal pneumonia. 3. Moderate diffuse bronchial wall thickening with multifocal subsegmental mucous plugging, likely due to bronchitis. 4. Unchanged left lung nodules. Aortic Atherosclerosis (ICD10-I70.0) and Emphysema (ICD10-J43.9). Coronary artery calcifications. Assessment for potential risk factor modification, dietary therapy or pharmacologic therapy may be warranted, if clinically indicated. Electronically Signed   By: Limin  Xu M.D.   On: 06/10/2024 12:13    EKG: Independently reviewed.  NSR with rate 83; nonspecific ST changes with no evidence of acute ischemia   Labs on Admission: I have personally reviewed the available labs and imaging  studies at the time of the admission.  Pertinent labs:    Glucose 192 HS troponin 9 WBC 12.6, improved from 17.4 Hgb 12.1,  stable COVID/flu/RSV negative   Assessment and Plan: Principal Problem:   Multifocal pneumonia Active Problems:   COPD, very severe (HCC)   GERD (gastroesophageal reflux disease)   Controlled type 2 diabetes mellitus without complication, without long-term current use of insulin  (HCC)   Chronic combined systolic and diastolic congestive heart failure (HCC)     Multifocal PNA/bronchitis Patient with recent admission for PNA Was doing well, had acute worsening over the last 1-2 days Comfortable now on home O2 CTA with bronchitis, possibly multifocal PNA He does not appear to require hospitalization and prefers outpatient management Recommend empiric treatment with Levaquin  x 5 days   COPD  No frank wheezing at this time Recommend concurrent steroids given history Continues 3L home O2 Continue nebulizer treatments   Anxiety and depression Continue psychiatric medications   Chronic combined systolic and diastolic congestive heart failure Last EF 50%, improved from prior  Continue Entresto , Farxiga  Appears compensated at this time   Controlled type 2 diabetes mellitus without complication, without long-term current use of insulin   Hemoglobin A1c 6.3, good control Continue Farxiga .   GERD (gastroesophageal reflux disease) On Protonix  and Carafate    Thank you for this interesting consult.  The patient appears to be hemodynamically stable and appropriate for outpatient treatment, which is his preference.  Author: Delon Herald, MD 06/10/2024 2:35 PM  For on call review [email or web address].

## 2024-06-11 ENCOUNTER — Other Ambulatory Visit: Payer: Self-pay | Admitting: Cardiology

## 2024-06-11 DIAGNOSIS — I5032 Chronic diastolic (congestive) heart failure: Secondary | ICD-10-CM

## 2024-06-15 ENCOUNTER — Other Ambulatory Visit: Payer: Self-pay | Admitting: Pulmonary Disease

## 2024-06-19 ENCOUNTER — Other Ambulatory Visit: Payer: Self-pay

## 2024-06-19 MED ORDER — BUDESONIDE 0.5 MG/2ML IN SUSP
0.5000 mg | Freq: Two times a day (BID) | RESPIRATORY_TRACT | 10 refills | Status: AC
Start: 2024-06-19 — End: ?

## 2024-07-10 ENCOUNTER — Other Ambulatory Visit: Payer: Self-pay | Admitting: Pulmonary Disease

## 2024-07-12 ENCOUNTER — Telehealth: Payer: Self-pay | Admitting: Cardiology

## 2024-07-12 ENCOUNTER — Encounter: Payer: Self-pay | Admitting: Pulmonary Disease

## 2024-07-12 ENCOUNTER — Ambulatory Visit (INDEPENDENT_AMBULATORY_CARE_PROVIDER_SITE_OTHER): Admitting: Pulmonary Disease

## 2024-07-12 VITALS — BP 110/60 | HR 72 | Temp 97.5°F | Ht 70.0 in | Wt 179.4 lb

## 2024-07-12 DIAGNOSIS — Z87891 Personal history of nicotine dependence: Secondary | ICD-10-CM | POA: Diagnosis not present

## 2024-07-12 DIAGNOSIS — J479 Bronchiectasis, uncomplicated: Secondary | ICD-10-CM | POA: Diagnosis not present

## 2024-07-12 DIAGNOSIS — J9611 Chronic respiratory failure with hypoxia: Secondary | ICD-10-CM

## 2024-07-12 DIAGNOSIS — J449 Chronic obstructive pulmonary disease, unspecified: Secondary | ICD-10-CM | POA: Diagnosis not present

## 2024-07-12 MED ORDER — AZITHROMYCIN 250 MG PO TABS: 250.0000 mg | ORAL_TABLET | ORAL | 2 refills | Status: DC

## 2024-07-12 NOTE — Progress Notes (Signed)
 Subjective:    Patient ID: Leslie Sims, male    DOB: [birth year], 73 y.o.   MRN: [id number]  Patient Care Team: Claudene Rayfield HERO, MD as PCP - General (Family Medicine) Darron Deatrice LABOR, MD as PCP - Cardiology (Cardiology) Darron Deatrice LABOR, MD as Consulting Physician (Cardiology) Verdene Gills, RN as Registered Nurse Tamea Dedra CROME, MD as Consulting Physician (Pulmonary Disease) Lenn Aran, MD as Radiation Oncologist (Radiation Oncology) Maurie Rayfield BIRCH, RN as Oncology Nurse Navigator Babara Call, MD as Consulting Physician (Oncology) Edith Rogue, MD (Neurology) Babara Call, MD as Consulting Physician (Oncology)  Chief Complaint  Patient presents with   Follow-up    SOB and wheezing.     BACKGROUND/INTERVAL:72 M, former smoker quit 02/10/2019 with severe emphysema seen by multiple pulmonologists in past ([place], [place], [place], [place], [place]) followed here for management of COPD. Last seen by  me on 08 May 2024. He has been on Ohtuvayre  as well as DuoNeb via neb.  He has also an ischemic cardiomyopathy and prior history of esophageal cancer.  Has chronic respiratory failure with hypoxia on chronic oxygen  therapy.  Admitted to [facility] with COPD exacerbation on 26 May and then subsequently with pneumonia on 22nd June.  HPI Discussed the use of AI scribe software for clinical note transcription with the patient, who gave verbal consent to proceed.  History of Present Illness   Leslie Sims is a 73 year old male with severe COPD and chronic respiratory failure who presents for follow-up after recent pneumonia.  He experiences ongoing congestion with secretions often getting stuck.  He has had a chronic cough for years.  He uses a flutter valve, an Brazil device, which involves blowing and sucking through the device twelve times. These help somewhat, but significant congestion persists.  He is currently using a rescue inhaler and a new medication, Ohtuvayre , twice  daily, along with Pulmicort . He recalls being on azithromycin  in the past, which he took as a pill, but he believes he is allergic to it.  His sputum is described as 'white cream' and can sometimes be brought up without much strain.  He has a history of smoking, having previously smoked four packs a day. He expresses a strong aversion to pneumonia, stating, 'I would never wish I don't know nobody, especially somebody with COPD.'     DATA/EVENTS: 07/24/12 Office Spirometry: Severe obstruction (FEV1 31% pred) 08/31/13 Office Spirometry: very severe obstruction (FEV1 25% pred) 06/18/15 CT chest: Severe emphysema 03/03/17 Overnight oximetry: Lowest SpO2 72%. 95% of time SpO2 was below 90% 03/03/17 : 384 meters. Desat to 84% 10/04/17 CT chest: Two new irregularly marginated nodules, one in the RUL, and a second in the mid LUL worrisome for either synchronous lung ca or metastatic involvement of the lungs. The two small nodules described on the prior dictated report have resolved in the left superior segment and most likely were postinflammatory. Diffuse centrilobular emphysema. 10/13/17 PET scan: The new left upper lobe nodule is FDG avid consistent with malignancy. Recommend tissue confirmation. The new nodule in the right upper lobe demonstrates low level uptake. While the level of uptake suggests the possibility of an inflammatory or infectious process, a low-grade malignancy is not excluded. Given the suspected malignancy on the left, tissue confirmation should be considered on the right despite the low level of uptake. No distant metastatic disease identified 10/27/17 ENB ([place]): Transbronchial Fine Needle Aspirations 21G X7, Transbronchial Forceps Biopsy X6. ALVEOLATED LUNG TISSUE WITH HEMOSIDERIN LADEN MACROPHAGES.  NEGATIVE  FOR ATYPIA AND MALIGNANCY 11/01/17 PFTs: FVC: 2.96 > 3.14 L (68 > 72 %pred), FEV1: 0.94 > 0.93 L (27 %pred), FEV1/FVC: 32%, TLC: 9.04 L (137 %pred), DLCO 62 %pred 12/07/17  CTA chest: No pulmonary embolus identified. Anterior left upper lobe ground-glass and nodular consolidation likely representing pneumonitis. Stable bilateral upper lobe pulmonary nodules. Stable emphysema. 01/21/18 CT chest: new masslike architectural distortion and ground-glass attenuation surrounding the index nodule within the RUL. The appearance favors an inflammatory or infectious process. The index nodule in the left upper lobe is slightly decreased in volume when compared with the previous exam favoring a benign process. Attention on follow-up imaging advise. Advanced changes of emphysema (very severe) 06/01/18 CTA chest: No evidence of pulmonary embolism. Waxing and waning bilateral pulmonary nodular process as described. New 8 mm nodular ill-defined focus of opacification over the lingula which may represent an acute inflammatory or infectious process. No effusion. Emphysema 10/19/18 CTA chest: No acute cardiopulmonary disease and no evidence of pulm embolism. Hospitalization 1/12-1/14/2020: COPD exacerbation Hospitalization 2/22-2/27/20: COPD exacerbation, HCAP 02/11/19 CT chest: Progressed masslike consolidation within the RUL in the region of previous waxing and waning nodule, this is contiguous with the pleural surface and demonstrates spiculated margin and probable small focus of central cavitation. Findings could be secondary to infection superimposed on chronic nodule. However cavitary neoplasm is also a concern. Multiple additional lung nodules which have not significantly changed. Small foci of ground-glass density in the RUL may reflect small focus of infection. Mod-severe emphysema 05/01/19 CT chest: Waxing/waning right lung opacities, suggesting infection, including atypical/mycobacterial infection. Improvement of the prior dominant masslike opacity argues against neoplasm. Stable left upper lobe nodule, likely benign. Aortic Atherosclerosis and Emphysema 11/02/2019 CT chest: Multiple  larger pulmonary nodules and masslike areas seen on the prior study have largely regressed.  Findings indicative of infectious/inflammatory process.  Diffuse bronchial wall thickening with moderate centrilobular and paraseptal emphysema.  Suggestion of left main and three-vessel coronary artery disease. 04/23/2020 CT chest: Multiple bilateral stable pulmonary nodules. New 7 mm nodule over the posterior left upper lobe. Stable nodularity over an area ofright upper lobe/apical scarring with new 9 mm nodular focus along the most inferior aspect of this scarring. This area has shown to wax and wane on previous exams and is most likely inflammatory. 07/24/2020 CT chest: Background of marked pulmonary emphysema with stable post infectious or inflammatory changes and with near complete resolution of a presumably infectious or inflammatory nodule in the LEFT upper lobe as compared to previous exam. 01/21/2021 CT chest: Severe emphysematous changes, pulmonary scarring, bilateral calcified granulomas. 01/29/2021 PFTs: Further decline in lung function.  FEV1 0.74 L or 22% predicted, FVC 1.91 L or 42% predicted.  No significant bronchodilator response.  FEV1/FVC 39%.  Patient could not perform lung volumes. Diffusion capacity severely reduced.  This is consistent with very severe COPD. 02/13/2021 2D echo: LVEF 40 to 45% global hypokinesis, severe hypokinesis of the periapical region.  Grade II diastolic dysfunction, dilated left atrium. 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  Biopsies high-grade dysplastic. 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 05/09/2022 PET/CT: Intense tracer uptake associated with the distal esophageal mass. 06/03/2022 chemotherapy-XRT: Patient began concurrent carboplatin /Taxol /XRT. 07/26/2022 completed XRT: Completing chemotherapy. 09/02/2022 PET/CT: Distal esophageal wall  thickening with significant reduction in abnormal FDG activity, small focus of minimally metabolic activity, possibly reflecting posttreatment change.  No evidence of hypermetabolic metastatic disease. 09/09/2022 echocardiogram: LVEF 50 to 55%,  grade 1 DD, normal RV function, 10/11/2022 chest x-ray: Hyperinflation of the lungs no acute cardiopulmonary disease.  Granuloma of the LEFT upper lobe. 12/06/2022 PET/CT: No evidence of local recurrence or metastatic disease after treatment for esophageal cancer.  Stable bilateral adrenal adenomas. 01/16/2023 chest x-ray: Changes consistent with COPD with no active disease. 01/26/2023 barium swallow: Moderate esophageal dysmotility, markedly delayed passage of barium tablet at the level of the GE junction passing after approximately 20 minutes. 04/04/2023 right and left heart cath: LVEF 25 to 35%, moderately elevated LV end-diastolic pressure, moderate to severe left ventricular systolic dysfunction, moderate to severely reduced LV systolic function with akinesis of mid to distal anterior and apical myocardium.  Right heart catheterization showed normal RA pressure, mild pulmonary hypertension mild elevated wedge pressure and normal cardiac output.  Patient referred to advanced heart failure clinic. 05/02/2023 echocardiogram: LVEF 35 to 40%, moderate decreased function on the LV, LV with regional wall motion abnormalities.  LV internal cavity dilated.  Grade 1 DD severe hypokinesis of the left ventricular, mid apical anterior wall, anterolateral wall and apical segment.  LA mildly dilated.  No valvular abnormalities. 06/10/2024 CT angio chest: No evidence of pulmonary embolism, irregular consolidation within the bilateral lung apices and anterior upper lobes, likely multifocal pneumonia.  Moderate diffuse bronchial wall thickening with multifocal segmental mucous plugging likely due to bronchitis, there are areas of significant bronchiectasis.  Unchanged left lung  nodules.  Review of Systems A 10 point review of systems was performed and it is as noted above otherwise negative.   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   COPD exacerbation (HCC) 07/31/2021   Prostate cancer (HCC) 10/11/2020   Elevated PSA 07/28/2020   Iron deficiency anemia 05/20/2020   Goals of care, counseling/discussion    Palliative care by specialist    Multifocal pneumonia 01/28/2019   COPD with acute exacerbation (HCC) 12/31/2018   Generalized anxiety disorder 11/12/2018   Moderate episode of recurrent major depressive disorder (HCC) 11/12/2018   Chronic respiratory failure with hypoxia (HCC) 09/29/2018   Acute respiratory failure with hypoxia and hypercapnia (HCC) 09/13/2018   Acute on chronic respiratory failure with hypoxia and hypercapnia (HCC) 08/24/2018   Atypical chest pain 07/02/2018   Grief reaction 01/13/2018   COPD with hypoxia (HCC) 12/25/2017   Unstable angina (HCC)    Controlled type 2 diabetes mellitus without complication, without long-term current use of insulin  (HCC) 10/25/2016   Benign neoplasm of ascending colon    Benign neoplasm of descending colon    Benign neoplasm of sigmoid colon    History of colonic polyps    Pulmonary nodules/lesions, multiple 01/05/2015   Bruit of left carotid artery 11/04/2014   Sleep disorder 07/18/2013   Seasonal and perennial allergic rhinitis 05/29/2013   Bradycardia 04/20/2011   SMOKER 07/30/2010   Coronary  artery disease 04/23/2010   Hyperlipidemia 03/24/2010   DEPRESSION/ANXIETY 03/24/2010   Chronic heart failure with preserved ejection fraction (HFpEF) (HCC) 03/24/2010   COPD, very severe (HCC) 03/24/2010   GERD (gastroesophageal reflux disease)  03/24/2010   Essential hypertension 11/21/2009    Social History   Tobacco Use   Smoking status: Former    Current packs/day: 0.00    Average packs/day: 3.0 packs/day for 42.0 years (126.0 ttl pk-yrs)    Types: Cigarettes    Start date: 02/10/1977    Quit date: 02/10/2019    Years since quitting: 5.4   Smokeless tobacco: Never   Tobacco comments:    25 months ago most smoked 4 packs a day .  Substance Use Topics   Alcohol use: No    Alcohol/week: 0.0 standard drinks of alcohol    Allergies  Allergen Reactions   Prozac  [Fluoxetine  Hcl] Shortness Of Breath   Hydroxyzine     Bupropion Other (See Comments)    Makes me feel funny Other reaction(s): Other (See Comments) Unknown/made me feel real funny Makes me feel funny Makes me feel funny Other reaction(s): Other (See Comments) Unknown/made me feel real funny Makes me feel funny   Effexor Xr [Venlafaxine Hcl Er] Other (See Comments)    Makes me feel funny    Current Meds  Medication Sig   albuterol  (VENTOLIN  HFA) 108 (90 Base) MCG/ACT inhaler TAKE 2 PUFFS BY MOUTH EVERY 6 HOURS AS NEEDED FOR WHEEZE OR SHORTNESS OF BREATH   ALPRAZolam  (XANAX ) 0.5 MG tablet Take 0.5 mg by mouth 3 (three) times daily.   aspirin  81 MG tablet Take 81 mg by mouth daily.   atorvastatin  (LIPITOR) 80 MG tablet Take 1 tablet (80 mg total) by mouth daily.   [START ON 07/13/2024] azithromycin  (ZITHROMAX ) 250 MG tablet Take 1 tablet (250 mg total) by mouth 3 (three) times a week. M-W-F   bisoprolol  (ZEBETA ) 5 MG tablet TAKE 1/2 TABLET BY MOUTH DAILY   budesonide  (PULMICORT ) 0.5 MG/2ML nebulizer solution Take 2 mLs (0.5 mg total) by nebulization in the morning and at bedtime.   dapagliflozin   propanediol (FARXIGA ) 10 MG TABS tablet TAKE 1 TABLET BY MOUTH DAILY BEFORE BREAKFAST.   Ensifentrine  (OHTUVAYRE ) 3 MG/2.5ML SUSP Inhale 3 mLs into the lungs 2 (two) times daily. DX:J44.9   ezetimibe  (ZETIA ) 10 MG tablet Take 1 tablet (10 mg total) by mouth daily.   famotidine  (PEPCID ) 20 MG tablet TAKE 1 TABLET (20 MG TOTAL) BY MOUTH 2 (TWO) TIMES DAILY. FOR BREAKTHROUGH REFLUX   FLUoxetine  (PROZAC ) 20 MG tablet Take 20 mg by mouth daily.   fluticasone  (FLONASE ) 50 MCG/ACT nasal spray Place 2 sprays into both nostrils daily.   furosemide  (LASIX ) 20 MG tablet Take 1 tablet (20 mg total) by mouth as needed.   ipratropium-albuterol  (DUONEB) 0.5-2.5 (3) MG/3ML SOLN Take 3 mLs by nebulization every 6 (six) hours as needed.   ketoconazole  (NIZORAL ) 2 % cream Apply 1 Application topically daily.   loratadine  (CLARITIN ) 10 MG tablet Take 10 mg by mouth daily.   mirtazapine  (REMERON ) 45 MG tablet Take 45 mg by mouth at bedtime.   Multiple Vitamin (MULTIVITAMIN) capsule Take 1 capsule by mouth daily.   nitroGLYCERIN  (NITROSTAT ) 0.4 MG SL tablet Place 1 tablet (0.4 mg total) under the tongue every 5 (five) minutes as needed.   OXYGEN  Inhale 3 L into the lungs daily.   pantoprazole  (PROTONIX ) 40 MG tablet Take 40 mg by mouth 2 (two) times daily before a meal.   sacubitril -valsartan  (ENTRESTO ) 49-51 MG Take 1 tablet by mouth 2 (two) times daily.   Spacer/Aero-Holding Chambers (AEROCHAMBER MV) inhaler Use as instructed   spironolactone  (ALDACTONE ) 25 MG tablet Take 0.5 tablets (12.5 mg total)  by mouth daily.   sucralfate  (CARAFATE ) 1 g tablet Take 1 tablet (1 g total) by mouth 3 (three) times daily with meals. 1 hour before meals and medications    Immunization History  Administered Date(s) Administered    sv, Bivalent, Protein Subunit Rsvpref,pf (Abrysvo) 02/20/2023   Fluad Quad(high Dose 65+) 11/16/2021, 11/05/2022   Influenza Whole 09/19/2012   Influenza, High Dose Seasonal PF 09/28/2019, 10/21/2023    Influenza,inj,Quad PF,6+ Mos 10/24/2013, 11/25/2014, 03/02/2016, 10/27/2016, 10/05/2017   Influenza-Unspecified 10/24/2013, 11/25/2014, 03/02/2016, 10/27/2016, 10/05/2017, 09/18/2018, 09/28/2019, 09/30/2020   Moderna Covid-19 Fall Seasonal Vaccine 43yrs & older 11/05/2022, 03/29/2023, 10/21/2023, 03/30/2024   Moderna Covid-19 Seasonal Vaccine 6 months thru 71years of age 46/17/2023   Moderna Sars-Covid-2 Vaccination 01/26/2020, 02/23/2020   PFIZER Comirnaty(Gray Top)Covid-19 Tri-Sucrose Vaccine 12/15/2020, 07/15/2021   PFIZER(Purple Top)SARS-COV-2 Vaccination 01/26/2020, 02/23/2020   PNEUMOCOCCAL CONJUGATE-20 03/22/2022   Pfizer Covid-19 Vaccine Bivalent Booster 37yrs & up 01/14/2022, 05/25/2022   Pneumococcal Conjugate-13 11/25/2014   Pneumococcal Polysaccharide-23 08/24/2016, 10/27/2016   Pneumococcal-Unspecified 12/20/2008   Tdap 12/20/2008        Objective:     BP 110/60 (BP Location: Right Arm, Cuff Size: Normal)   Pulse 72   Temp (!) 97.5 F (36.4 C)   Ht 5' 10 (1.778 m)   Wt 179 lb 6.4 oz (81.4 kg)   SpO2 95%   BMI 25.74 kg/m   SpO2: 95 % O2 Device: None (Room air), Nasal cannula O2 Flow Rate (L/min): 3 L/min O2 Type: Pulse O2  GENERAL: Well-developed well-nourished gentleman, chronic use of accessories, no conversational dyspnea. No acute distress.  Presents in transport chair due to dyspnea. Using oxygen  via portable concentrator at 4 L pulsed. HEAD: Normocephalic, atraumatic. EYES: Pupils equal, round, reactive to light.  No scleral icterus. MOUTH: Wears dentures. NECK: Supple. No thyromegaly. No nodules. No JVD.  Trachea midline, no crepitus. PULMONARY: Very distant breath sounds, worse on left base, air movement poor, coarse breath sounds.  No rhonchi or wheezes. CARDIOVASCULAR: S1 and S2. Regular rate and rhythm.  No rubs murmurs or gallops heard. GASTROINTESTINAL: Protuberant abdomen, benign. MUSCULOSKELETAL: No joint deformity, no clubbing, 1+ pitting  edema feet/ankles. NEUROLOGIC: Awake, alert, oriented.  No overt focal deficits.  Speech is fluent. SKIN: Intact,warm,dry.  Limited exam: No rashes. PSYCH: Normal mood and behavior.   Assessment & Plan:     ICD-10-CM   1. Stage 4 very severe COPD by GOLD classification (HCC)  J44.9     2. Bronchiectasis without complication (HCC)  J47.9     3. Chronic respiratory failure with hypoxia (HCC)  J96.11     4. Former smoker  Z87.891      Orders Placed This Encounter  Procedures   AMB REFERRAL FOR DME    Referral Priority:   Routine    Referral Type:   Durable Medical Equipment Purchase    Number of Visits Requested:   1   Meds ordered this encounter  Medications   azithromycin  (ZITHROMAX ) 250 MG tablet    Sig: Take 1 tablet (250 mg total) by mouth 3 (three) times a week. M-W-F    Dispense:  12 tablet    Refill:  2   Discussion:    Severe COPD with chronic respiratory failure Severe COPD with chronic respiratory failure, characterized by congestion and difficulty clearing secretions. Currently using an [place] device to aid in secretion clearance. On Ohtuvayre , a new medication for COPD that addresses airway inflammation, and uses a rescue inhaler and Pulmicort . There is  a need to improve secretion clearance to prevent further complications. - Procure a smart vest to aid in secretion clearance - Continue Ohtuvayre , rescue inhaler, and Pulmicort  as prescribed  Bronchiectasis Bronchiectasis contributes to difficulty in clearing secretions and recurrent infections. The use of azithromycin  is intended to manage this condition by reducing bacterial load and inflammation. - Continue azithromycin  as prescribed for bronchiectasis management (MWF) - Will order smart vest for management of secretions - Patient has failed other airway clearance devices such as Aerobika  Recent pneumonia Recent pneumonia has exacerbated COPD symptoms. Reports white creamy sputum production, which he can  bring up with some effort. There is a need to prevent further infections and manage current symptoms.    Advised if symptoms do not improve or worsen, to please contact office for sooner follow up or seek emergency care.    I spent 33 minutes of dedicated to the care of this patient on the date of this encounter to include pre-visit review of records, face-to-face time with the patient discussing conditions above, post visit ordering of testing, clinical documentation with the electronic health record, making appropriate referrals as documented, and communicating necessary findings to members of the patients care team.     C. Leita Sanders, MD Advanced Bronchoscopy PCCM [facility]    *This note was generated using voice recognition software/Dragon and/or AI transcription program.  Despite best efforts to proofread, errors can occur which can change the meaning. Any transcriptional errors that result from this process are unintentional and may not be fully corrected at the time of dictation.

## 2024-07-12 NOTE — Telephone Encounter (Signed)
 Called to confirm/remind patient of their appointment at the [facility] on 07/12/24 .   Appointment:   [] Confirmed  [x] Left mess   [] No answer/No voice mail  [] VM Full/unable to leave message  [] Phone not in service  Patient reminded to bring all medications and/or complete list.  Confirmed patient has transportation. Gave directions, instructed to utilize valet parking.

## 2024-07-12 NOTE — Patient Instructions (Signed)
 VISIT SUMMARY:  You came in today for a follow-up visit after your recent pneumonia. We discussed your ongoing issues with severe COPD, chronic respiratory failure, and bronchiectasis, focusing on your congestion and difficulty clearing secretions.  YOUR PLAN:  -SEVERE COPD WITH CHRONIC RESPIRATORY FAILURE: Chronic Obstructive Pulmonary Disease (COPD) is a long-term lung condition that makes it hard to breathe, and chronic respiratory failure means your lungs are not getting enough oxygen  into your blood. You should continue using your flutter valve to help clear secretions. We will also procure a chest physiotherapy vest to further aid in secretion clearance. Keep taking Ohtuvayre , DuoNeb, your rescue inhaler, and Pulmicort  as prescribed.  -BRONCHIECTASIS: Bronchiectasis is a condition where the airways in your lungs become damaged and widened, leading to mucus build-up and infections.  Start taking azithromycin  3 times a week as prescribed to help manage this condition by reducing bacterial load and inflammation.  -RECENT PNEUMONIA: Pneumonia is an infection that inflames the air sacs in one or both lungs. Your recent pneumonia has worsened your COPD symptoms. You should continue to monitor your sputum production and try to bring up secretions as much as possible to prevent further infections.  INSTRUCTIONS:  We will procure a chest physiotherapy vest to help with secretion clearance. Continue taking Ohtuvayre , your rescue inhaler, Pulmicort , and azithromycin  as prescribed. Monitor your sputum production and try to bring up secretions as much as possible. Follow up with us  if you experience any worsening symptoms or new issues.

## 2024-07-13 ENCOUNTER — Encounter: Payer: Self-pay | Admitting: Cardiology

## 2024-07-13 ENCOUNTER — Ambulatory Visit (HOSPITAL_BASED_OUTPATIENT_CLINIC_OR_DEPARTMENT_OTHER): Admitting: Cardiology

## 2024-07-13 ENCOUNTER — Ambulatory Visit (HOSPITAL_COMMUNITY): Payer: Self-pay | Admitting: Cardiology

## 2024-07-13 ENCOUNTER — Other Ambulatory Visit
Admission: RE | Admit: 2024-07-13 | Discharge: 2024-07-13 | Disposition: A | Discharge status: Home / Self Care | Source: Ambulatory Visit | Attending: Cardiology | Admitting: Cardiology

## 2024-07-13 VITALS — BP 119/58 | HR 71 | Wt 181.0 lb

## 2024-07-13 DIAGNOSIS — I5022 Chronic systolic (congestive) heart failure: Secondary | ICD-10-CM

## 2024-07-13 DIAGNOSIS — I5032 Chronic diastolic (congestive) heart failure: Secondary | ICD-10-CM

## 2024-07-13 LAB — BASIC METABOLIC PANEL WITH GFR
Anion gap: 6 (ref 5–15)
BUN: 18 mg/dL (ref 8–23)
CO2: 32 mmol/L (ref 22–32)
Calcium: 8.6 mg/dL — ABNORMAL LOW (ref 8.9–10.3)
Chloride: 103 mmol/L (ref 98–111)
Creatinine, Ser: 0.75 mg/dL (ref 0.61–1.24)
GFR, Estimated: >60 mL/min (ref >60)
Glucose, Bld: 98 mg/dL (ref 70–99)
Potassium: 3.5 mmol/L (ref 3.5–5.1)
Sodium: 141 mmol/L (ref 135–145)

## 2024-07-13 LAB — BRAIN NATRIURETIC PEPTIDE: B Natriuretic Peptide: 165.7 pg/mL — ABNORMAL HIGH (ref 0.0–100.0)

## 2024-07-13 NOTE — Patient Instructions (Signed)
 Medication Changes:  No medication changes today!  Lab Work:  Go over to the [facility]. Go pass the gift shop and have your blood work completed.  We will only call you if the results are abnormal or if the provider would like to make medication changes.   If you are having labs drawn today, any labs that are abnormal the clinic will call you. No news is good news.  Follow-Up in: Please follow up with the [facility] in 4 months with Ellouise Class, FNP.   Thank you for choosing [facility].    At the [facility], you and your health needs are our priority. We have a designated team specialized in the treatment of Heart Failure. This Care Team includes your primary Heart Failure Specialized Cardiologist (physician), Advanced Practice Providers (APPs- Physician Assistants and Nurse Practitioners), and Pharmacist who all work together to provide you with the care you need, when you need it.   You may see any of the following providers on your designated Care Team at your next follow up:  Dr. Toribio Fuel Dr. Ezra Shuck Dr. Ria Commander Dr. Morene Brownie Ellouise Class, FNP Jaun Bash, RPH-CPP  Please be sure to bring in all your medications bottles to every appointment.   Need to Contact Us :  If you have any questions or concerns before your next appointment please send us  a message through [place] or call our office at [phone].    TO LEAVE A MESSAGE FOR THE NURSE SELECT OPTION 2, PLEASE LEAVE A MESSAGE INCLUDING: YOUR NAME DATE OF BIRTH CALL BACK NUMBER REASON FOR CALL**this is important as we prioritize the call backs  YOU WILL RECEIVE A CALL BACK THE SAME DAY AS LONG AS YOU CALL BEFORE 4:00 PM

## 2024-07-15 NOTE — Progress Notes (Signed)
 PCP: Claudene Rayfield HERO, MD Cardiology: Dr. Darron HF Cardiology: Dr. Rolan  Chief complaint: CHF  73 y.o. with history of CAD, ischemic cardiomyopathy, and severe COPD on home oxygen  was referred by Dr. Darron for evaluation of heart failure. Patient had an anterior MI in 2010.  At that time, he had DES to LAD and to RCA.  Repeat caths in 2017 and in 4/24 have shown patent stents and minimal progression of CAD.  Echoes showed EF 40-45% generally, then echo in 9/23 showed EF up to 50-55% though study was technically difficult.  LV-gram with cath in 4/24 suggested lower EF at 25-35%. Patient was a heavy smoker in the past, quit in 2020.  He has severe COPD and wears 3L home oxygen . 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    Due to progressive dyspnea and atypical chest pain, he had RHC/LHC done in 4/24.  Results of angiography and LV-gram as above, RHC showed normal filling pressures and preserved cardiac output.  He went to the ER yesterday with cough and worsening dyspnea, he was thought to have a COPD exacerbation and was given prednisone  and nebs.    Echo in 5/24 showed EF 35-40%, mild LV dilation, normal RV.    Echo in 4/25 showed EF up to 50-55%, RV normal, IVC normal.   Patient continues to wear 3L home oxygen .  He was hospitalized with PNA in 5/25.  Breathing is still worse post-PNA than prior.  He is short of breath with moderate exertion. Weight is down 9 lbs.  No chest pain.  Some coughing and wheezing.  Rarely taking Lasix .                                                                                                       Labs (2/24): LDL 47 Labs (4/24): hgb 13.3, K 4.1, creatinine 0.95 Labs (6/24): BNP 42, K 3.9, creatinine 0.93 Labs (7/24): LDL 58 Labs (2/25): K 4.6, creatinine 1.15 Labs (3/25): LDL 69 Labs (6/25): K 3.9, creatinine 1.05  PMH: 1. CAD: Anterior MI in 2010 with DES to  LAD and RCA.  - LHC (2017): Patent stents.  - LHC (4/24): Mild nonobstructive CAD with patent stents.  2. Chronic systolic CHF: Ischemic cardiomyopathy.   - Echo (2019): EF 40-45% - Echo (2/22): EF 40-45% - Echo (9/23): Technically difficult, EF 50-55%.  - LV-gram (4/24): EF 25-35% - RHC (4/24): mean RA 6, PA 38/15, mean PCWP 15, CI 3.16 - Echo (5/24): EF 35-40%, mild LV dilation, normal RV. - Echo (4/25): EF 50-55%, RV normal, IVC normal. 3. COPD: On 3.5 L home oxygen , severe. Quit smoking 2020.  4. H/o PVCs 5. Esophageal squamous cell carcinoma: S/p chemo-radiation.  6. Prostate cancer: Radiation.   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  Vaping Use   Vaping status: Never Used  Substance and Sexual Activity   Alcohol use: No    Alcohol/week: 0.0 standard drinks of alcohol   Drug use: No   Sexual activity: Yes  Other Topics Concern   Not on file  Social History Narrative   Marital status: Widowed. Lives alone.    Social Drivers of Corporate investment banker Strain: Low Risk  (03/30/2024)   Received from [facility]   Overall Financial Resource Strain (CARDIA)    Difficulty of Paying Living Expenses: Not hard at all  Food Insecurity: Patient Declined (05/14/2024)   Hunger Vital Sign    Worried About Running Out of Food in the Last Year: Patient declined    Ran Out of Food in the Last  Year: Patient declined  Transportation Needs: Patient Declined (05/14/2024)   PRAPARE - Administrator, Civil Service (Medical): Patient declined    Lack of Transportation (Non-Medical): Patient declined  Physical Activity: Inactive (01/06/2022)   Received from [facility]   Exercise Vital Sign    On average, how many days per week do you engage in moderate to strenuous exercise (like a brisk walk)?: 0 days    On average, how many minutes do you engage in exercise at this level?: 0 min  Stress: Stress Concern Present (01/06/2022)   Received from [facility] - Occupational Stress Questionnaire    Feeling of Stress : Rather much  Social Connections: Patient Declined (05/14/2024)   Social Connection and Isolation Panel    Frequency of Communication with Friends and Family: Patient declined    Frequency of Social Gatherings with Friends and Family: Patient declined    Attends Religious Services: Patient declined    Database administrator or Organizations: Patient declined    Attends Banker Meetings: Patient declined    Marital Status: Patient declined  Intimate Partner Violence: Patient Declined (05/14/2024)   Humiliation, Afraid, Rape, and Kick questionnaire    Fear of Current or Ex-Partner: Patient declined    Emotionally Abused: Patient declined    Physically Abused: Patient declined    Sexually Abused: Patient declined   Family History  Problem Relation Age of Onset   Heart attack Brother        Brother #1   Diabetes Brother    Hypertension Brother        #3   Coronary artery disease Father 29       deceased   Heart attack Father    Diabetes Father    Heart disease Father    COPD Mother 82       deceased   Alcohol abuse Sister        polysubstance abuse   COPD Sister    Lung cancer Sister    Alcohol abuse Sister        polysubstance abuse   Penile cancer Brother     Diabetes Brother  Prostate cancer Neg Hx    Bladder Cancer Neg Hx    Kidney cancer Neg Hx    ROS: All systems reviewed and negative except as per HPI.   Current Outpatient Medications  Medication Sig Dispense Refill   albuterol  (VENTOLIN  HFA) 108 (90 Base) MCG/ACT inhaler TAKE 2 PUFFS BY MOUTH EVERY 6 HOURS AS NEEDED FOR WHEEZE OR SHORTNESS OF BREATH 18 each 2   ALPRAZolam  (XANAX ) 0.5 MG tablet Take 0.5 mg by mouth 3 (three) times daily.     aspirin  81 MG tablet Take 81 mg by mouth daily.     atorvastatin  (LIPITOR) 80 MG tablet Take 1 tablet (80 mg total) by mouth daily. 90 tablet 3   azithromycin  (ZITHROMAX ) 250 MG tablet Take 1 tablet (250 mg total) by mouth 3 (three) times a week. M-W-F 12 tablet 2   bisoprolol  (ZEBETA ) 5 MG tablet TAKE 1/2 TABLET BY MOUTH DAILY 45 tablet 2   budesonide  (PULMICORT ) 0.5 MG/2ML nebulizer solution Take 2 mLs (0.5 mg total) by nebulization in the morning and at bedtime. 360 mL 10   dapagliflozin  propanediol (FARXIGA ) 10 MG TABS tablet TAKE 1 TABLET BY MOUTH DAILY BEFORE BREAKFAST. 30 tablet 11   Ensifentrine  (OHTUVAYRE ) 3 MG/2.5ML SUSP Inhale 3 mLs into the lungs 2 (two) times daily. DX:J44.9 360 mL 0   ezetimibe  (ZETIA ) 10 MG tablet Take 1 tablet (10 mg total) by mouth daily. 90 tablet 3   famotidine  (PEPCID ) 20 MG tablet TAKE 1 TABLET (20 MG TOTAL) BY MOUTH 2 (TWO) TIMES DAILY. FOR BREAKTHROUGH REFLUX 60 tablet 3   FLUoxetine  (PROZAC ) 20 MG tablet Take 20 mg by mouth daily.     fluticasone  (FLONASE ) 50 MCG/ACT nasal spray Place 2 sprays into both nostrils daily.     furosemide  (LASIX ) 20 MG tablet Take 1 tablet (20 mg total) by mouth as needed. 30 tablet 1   ipratropium-albuterol  (DUONEB) 0.5-2.5 (3) MG/3ML SOLN Take 3 mLs by nebulization every 6 (six) hours as needed. 360 mL 11   ketoconazole  (NIZORAL ) 2 % cream Apply 1 Application topically daily.     loratadine  (CLARITIN ) 10 MG tablet Take 10 mg by mouth daily.     mirtazapine  (REMERON ) 45 MG tablet  Take 45 mg by mouth at bedtime.     Multiple Vitamin (MULTIVITAMIN) capsule Take 1 capsule by mouth daily.     nitroGLYCERIN  (NITROSTAT ) 0.4 MG SL tablet Place 1 tablet (0.4 mg total) under the tongue every 5 (five) minutes as needed. 25 tablet 3   OXYGEN  Inhale 3 L into the lungs daily.     pantoprazole  (PROTONIX ) 40 MG tablet Take 40 mg by mouth 2 (two) times daily before a meal.     sacubitril -valsartan  (ENTRESTO ) 49-51 MG Take 1 tablet by mouth 2 (two) times daily.     Spacer/Aero-Holding Chambers (AEROCHAMBER MV) inhaler Use as instructed 1 each 2   spironolactone  (ALDACTONE ) 25 MG tablet Take 0.5 tablets (12.5 mg total) by mouth daily. 90 tablet 3   sucralfate  (CARAFATE ) 1 g tablet Take 1 tablet (1 g total) by mouth 3 (three) times daily with meals. 1 hour before meals and medications 90 tablet 2   No current facility-administered medications for this visit.   BP (!) 119/58   Pulse 71   Wt 181 lb (82.1 kg)   SpO2 95%   PF (!) 2 L/min   BMI 25.97 kg/m  General: NAD Neck: No JVD, no thyromegaly or thyroid nodule.  Lungs: Distant BS CV: Nondisplaced  PMI.  Heart regular S1/S2, no S3/S4, no murmur.  No peripheral edema.  No carotid bruit.  Normal pedal pulses.  Abdomen: Soft, nontender, no hepatosplenomegaly, no distention.  Skin: Intact without lesions or rashes.  Neurologic: Alert and oriented x 3.  Psych: Normal affect. Extremities: No clubbing or cyanosis.  HEENT: Normal.   Assessment/Plan: 1. Chronic systolic CHF: Ischemic cardiomyopathy.  Historically, EF has been in the 40-45% range, but echo in 9/23 showed EF 50-55% (technically difficult study).  LV-gram with cath in 4/24 suggested that EF may be down to the 25-35% range.  He had no new coronary disease on cath in 4/24 to explain fall in EF.  Echo in 5/24 showed EF 35-40%, mild LV dilation, normal RV.  Echo in 4/25 showed EF up to 50-55%, RV normal, IVC normal.  NYHA class III, but think severe COPD plays the major role in  symptoms currently.  He does not look significantly volume overloaded on exam today.   - Continue spironolactone  25 daily.  - Increase Entresto  to 49/51 bid. BMET/BNP today.    - Continue Farxiga  10 mg daily.  - Continue beta-1 selective bisoprolol  2.5 mg daily.   2. COPD: Severe, on 3 L home oxygen . Prior smoker.  As above, I suspect COPD is currently the major contributor to his dyspnea.  He does not appear volume overloaded on exam.  - Continue followup with pulmonary.  3.  CAD: Anterior MI in 2010 with DES to LAD and RCA.  Cath in 4/24 showed patent stents and mild nonobstructive CAD.  No recent exertional chest pain.   - Continue ASA 81 daily.  - Continue atorvastatin  80 mg daily, lipids ok in 3/25.    Followup in 4 months with APP   I spent 21 minutes reviewing records, interviewing/examining patient, and managing order   Ezra Shuck 07/15/2024

## 2024-07-17 ENCOUNTER — Telehealth: Payer: Self-pay | Admitting: Cardiology

## 2024-07-17 MED ORDER — FUROSEMIDE 20 MG PO TABS: 20.0000 mg | ORAL_TABLET | ORAL | 1 refills | Status: DC | PRN

## 2024-07-17 NOTE — Telephone Encounter (Signed)
 Refill sent to preferred pharmacy.

## 2024-07-21 ENCOUNTER — Other Ambulatory Visit: Payer: Self-pay | Admitting: Pulmonary Disease

## 2024-08-06 ENCOUNTER — Other Ambulatory Visit: Payer: Self-pay

## 2024-08-06 DIAGNOSIS — J449 Chronic obstructive pulmonary disease, unspecified: Secondary | ICD-10-CM

## 2024-08-06 DIAGNOSIS — J9611 Chronic respiratory failure with hypoxia: Secondary | ICD-10-CM

## 2024-08-06 MED ORDER — OHTUVAYRE 3 MG/2.5ML IN SUSP: 3.0000 mL | Freq: Two times a day (BID) | RESPIRATORY_TRACT | 2 refills | Status: DC

## 2024-08-06 NOTE — Telephone Encounter (Signed)
 Refill sent for OHTUVAYRE  to [facility]: [phone]  Dose: 3mg  inhaled via nebulizer twice daily  Last OV: 07/12/2024 Provider: Dr. Tamea  Next OV: 09/13/2024  Sherry Pennant, PharmD, MPH, BCPS Clinical Pharmacist (Rheumatology and Pulmonology)

## 2024-08-07 ENCOUNTER — Emergency Department

## 2024-08-07 ENCOUNTER — Emergency Department
Admission: EM | Admit: 2024-08-07 | Discharge: 2024-08-07 | Disposition: A | Discharge status: Home / Self Care | Attending: Emergency Medicine | Admitting: Emergency Medicine

## 2024-08-07 ENCOUNTER — Other Ambulatory Visit: Payer: Self-pay

## 2024-08-07 DIAGNOSIS — I10 Essential (primary) hypertension: Secondary | ICD-10-CM | POA: Insufficient documentation

## 2024-08-07 DIAGNOSIS — I2510 Atherosclerotic heart disease of native coronary artery without angina pectoris: Secondary | ICD-10-CM | POA: Insufficient documentation

## 2024-08-07 DIAGNOSIS — D72829 Elevated white blood cell count, unspecified: Secondary | ICD-10-CM | POA: Insufficient documentation

## 2024-08-07 DIAGNOSIS — R0602 Shortness of breath: Secondary | ICD-10-CM | POA: Diagnosis present

## 2024-08-07 DIAGNOSIS — J441 Chronic obstructive pulmonary disease with (acute) exacerbation: Secondary | ICD-10-CM | POA: Insufficient documentation

## 2024-08-07 LAB — BASIC METABOLIC PANEL WITH GFR
Anion gap: 9 (ref 5–15)
BUN: 10 mg/dL (ref 8–23)
CO2: 33 mmol/L — ABNORMAL HIGH (ref 22–32)
Calcium: 9.3 mg/dL (ref 8.9–10.3)
Chloride: 101 mmol/L (ref 98–111)
Creatinine, Ser: 0.75 mg/dL (ref 0.61–1.24)
GFR, Estimated: >60 mL/min (ref >60)
Glucose, Bld: 116 mg/dL — ABNORMAL HIGH (ref 70–99)
Potassium: 4.1 mmol/L (ref 3.5–5.1)
Sodium: 143 mmol/L (ref 135–145)

## 2024-08-07 LAB — TROPONIN I (HIGH SENSITIVITY): Troponin I (High Sensitivity): 5 ng/L (ref <18)

## 2024-08-07 LAB — CBC
HCT: 42.7 % (ref 39.0–52.0)
Hemoglobin: 12.5 g/dL — ABNORMAL LOW (ref 13.0–17.0)
MCH: 27.9 pg (ref 26.0–34.0)
MCHC: 29.3 g/dL — ABNORMAL LOW (ref 30.0–36.0)
MCV: 95.3 fL (ref 80.0–100.0)
Platelets: 359 K/uL (ref 150–400)
RBC: 4.48 MIL/uL (ref 4.22–5.81)
RDW: 15.3 % (ref 11.5–15.5)
WBC: 11.3 K/uL — ABNORMAL HIGH (ref 4.0–10.5)
nRBC: 0 % (ref 0.0–0.2)

## 2024-08-07 MED ORDER — IPRATROPIUM-ALBUTEROL 0.5-2.5 (3) MG/3ML IN SOLN
6.0000 mL | Freq: Once | RESPIRATORY_TRACT | Status: AC
  Administered 2024-08-07: 6 mL via RESPIRATORY_TRACT
  Filled 2024-08-07: qty 6

## 2024-08-07 MED ORDER — METHYLPREDNISOLONE SODIUM SUCC 125 MG IJ SOLR
125.0000 mg | Freq: Once | INTRAMUSCULAR | Status: AC
  Administered 2024-08-07: 125 mg via INTRAVENOUS
  Filled 2024-08-07: qty 2

## 2024-08-07 MED ORDER — PREDNISONE 20 MG PO TABS: 40.0000 mg | ORAL_TABLET | Freq: Every day | ORAL | 0 refills | Status: AC

## 2024-08-07 MED ORDER — AMOXICILLIN-POT CLAVULANATE 875-125 MG PO TABS: 1.0000 | ORAL_TABLET | Freq: Two times a day (BID) | ORAL | 0 refills | Status: AC

## 2024-08-07 NOTE — Discharge Instructions (Addendum)
 You were seen in the emergency room today for evaluation of your shortness of breath.  I suspect this is related to COPD exacerbation.  Your testing here was overall reassuring.  I am glad you are feeling better after treatment here.  He sent a prescription of antibiotics and steroids to your pharmacy.  Follow with your primary care doctor and pulmonologist for further evaluation.  Return to the ER for new or worsening symptoms.

## 2024-08-07 NOTE — ED Triage Notes (Signed)
 Pt sts that he has been having SOB for the last three weeks. Pt sts that he has been wanting to come but just hasn't been able to get here. Pt sts that he wears 3L/min chronically at home.

## 2024-08-07 NOTE — ED Provider Notes (Signed)
 [facility] Provider Note    Event Date/Time   First MD Initiated Contact with Patient 08/07/24 1138     (approximate)   History   Shortness of Breath   HPI  Leslie Sims is a 73 year old male with history of COPD on 3 L home oxygen , HTN, CAD presenting to the emergency department for evaluation of shortness of breath.  Patient reports that for the past several weeks he has had ongoing shortness of breath.  No associated chest pain.  Still using his home oxygen .  No fevers.     Physical Exam   Triage Vital Signs: ED Triage Vitals  Encounter Vitals Group     BP 08/07/24 1133 110/67     Girls Systolic BP Percentile --      Girls Diastolic BP Percentile --      Boys Systolic BP Percentile --      Boys Diastolic BP Percentile --      Pulse Rate 08/07/24 1132 72     Resp 08/07/24 1132 (!) 22     Temp 08/07/24 1132 98 F (36.7 C)     Temp Source 08/07/24 1132 Oral     SpO2 08/07/24 1132 96 %     Weight 08/07/24 1128 171 lb (77.6 kg)     Height 08/07/24 1128 5' 10 (1.778 m)     Head Circumference --      Peak Flow --      Pain Score 08/07/24 1128 7     Pain Loc --      Pain Education --      Exclude from Growth Chart --     Most recent vital signs: Vitals:   08/07/24 1330 08/07/24 1400  BP: (!) 116/49 (!) 107/58  Pulse: 64 69  Resp: 15 18  Temp:    SpO2: 100% 99%     General: Awake, interactive  CV:  Regular rate, good peripheral perfusion.  Resp:  Mildly labored respirations, diminished expiratory sounds with occasional wheezing Abd:  Nondistended.  Neuro:  Symmetric facial movement, fluid speech   ED Results / Procedures / Treatments   Labs (all labs ordered are listed, but only abnormal results are displayed) Labs Reviewed  BASIC METABOLIC PANEL WITH GFR - Abnormal; Notable for the following components:      Result Value   CO2 33 (*)    Glucose, Bld 116 (*)    All other components within normal limits  CBC -  Abnormal; Notable for the following components:   WBC 11.3 (*)    Hemoglobin 12.5 (*)    MCHC 29.3 (*)    All other components within normal limits  TROPONIN I (HIGH SENSITIVITY)     EKG EKG independently reviewed and interpreted by myself demonstrates:  EKG demonstrates normal sinus rhythm rate of 66, PR 190, QRS 104, QTc 442, no acute ST changes  RADIOLOGY Imaging independently reviewed and interpreted by myself demonstrates:  CXR without focal consolidation  Formal Radiology Read:  [facility] Chest Port 1 View Result Date: 08/07/2024 EXAM: 2 FRONTAL VIEWS XRAY OF THE CHEST 08/07/2024 12:24:48 PM COMPARISON: 04/24/2024 CLINICAL HISTORY: [id number] SOB (shortness of breath) [id number]. Table formatting from the original note was not included.; Pt sts that he has been having SOB for the last three weeks. Pt sts that he has been wanting to come but just hasn't been able to get here. Pt sts that he wears 3L/min chronically at home FINDINGS: LUNGS AND PLEURA: No focal pulmonary  opacity. No pulmonary edema. No pleural effusion. No pneumothorax. Resolved left upper lobe pneumonia. Interstitial / peribronchial thickening. HEART AND MEDIASTINUM: No acute abnormality of the cardiac and mediastinal silhouettes. Atherosclerosis in the transverse aorta. BONES AND SOFT TISSUES: No acute osseous abnormality. Multiple wires and leads project over the chest on the frontal radiograph. Apical lordotic positioning. Metallic object projecting over the left side of the chest is presumably external to the patient. IMPRESSION: 1. No acute findings. 2. Interstitial thickening likely related to smoking/chronic bronchitis. Electronically signed by: Rockey Kilts MD 08/07/2024 01:01 PM EDT RP Workstation: [id number]    PROCEDURES:  Critical Care performed: No  Procedures   MEDICATIONS ORDERED IN ED: Medications  ipratropium-albuterol  (DUONEB) 0.5-2.5 (3) MG/3ML nebulizer solution 6 mL (6 mLs Nebulization Given 08/07/24 1248)   methylPREDNISolone  sodium succinate (SOLU-MEDROL ) 125 mg/2 mL injection 125 mg (125 mg Intravenous Given 08/07/24 1248)  ipratropium-albuterol  (DUONEB) 0.5-2.5 (3) MG/3ML nebulizer solution 6 mL (6 mLs Nebulization Given 08/07/24 1416)     IMPRESSION / MDM / ASSESSMENT AND PLAN / ED COURSE  I reviewed the triage vital signs and the nursing notes.  Differential diagnosis includes, but is not limited to, COPD exacerbation, viral illness, pneumonia, anemia, lower suspicion ACS  Patient's presentation is most consistent with acute presentation with potential threat to life or bodily function.  73 year old male presenting with several weeks of shortness of breath found to have wheezing on exam in the setting of severe COPD.  Stable vitals on presentation.  Labs with mild leukocytosis and anemia, BMP without significant derangement.  Negative troponin and several days of symptoms.  Low suspicion ACS.  X-Opaline [place] without focal consolidation.  Patient given steroids and nebs here, did report improvement but some ongoing wheezing.  He was given another round of DuoNeb treatments.  He did have mild ongoing wheezing, but felt significantly improved and was comfortable with discharge home.  Improved symptoms and stable oxygen  saturation on his home oxygen , do think discharge is reasonable.  Strict return precautions provided.  Patient discharged with prescription for prednisone  and Augmentin  given suspected COPD exacerbation.  He was discharged in stable condition.     FINAL CLINICAL IMPRESSION(S) / ED DIAGNOSES   Final diagnoses:  COPD exacerbation (HCC)     Rx / DC Orders   ED Discharge Orders          Ordered    predniSONE  (DELTASONE ) 20 MG tablet  Daily with breakfast        08/07/24 1452    amoxicillin -clavulanate (AUGMENTIN ) 875-125 MG tablet  2 times daily        08/07/24 1452             Note:  This document was prepared using Dragon voice recognition software and may include  unintentional dictation errors.   Levander Slate, MD 08/07/24 1452

## 2024-08-10 ENCOUNTER — Other Ambulatory Visit: Payer: Self-pay | Admitting: Cardiology

## 2024-08-16 ENCOUNTER — Other Ambulatory Visit: Payer: Self-pay | Admitting: Cardiology

## 2024-08-22 ENCOUNTER — Telehealth: Payer: Self-pay | Admitting: Cardiology

## 2024-09-06 ENCOUNTER — Inpatient Hospital Stay: Attending: Oncology

## 2024-09-06 ENCOUNTER — Ambulatory Visit
Admission: RE | Admit: 2024-09-06 | Discharge: 2024-09-06 | Disposition: A | Discharge status: Home / Self Care | Source: Ambulatory Visit | Attending: Oncology | Admitting: Oncology

## 2024-09-06 ENCOUNTER — Other Ambulatory Visit

## 2024-09-06 DIAGNOSIS — J9611 Chronic respiratory failure with hypoxia: Secondary | ICD-10-CM | POA: Diagnosis not present

## 2024-09-06 DIAGNOSIS — C159 Malignant neoplasm of esophagus, unspecified: Secondary | ICD-10-CM

## 2024-09-06 DIAGNOSIS — Z9981 Dependence on supplemental oxygen: Secondary | ICD-10-CM | POA: Diagnosis not present

## 2024-09-06 DIAGNOSIS — I252 Old myocardial infarction: Secondary | ICD-10-CM | POA: Insufficient documentation

## 2024-09-06 DIAGNOSIS — C61 Malignant neoplasm of prostate: Secondary | ICD-10-CM | POA: Diagnosis not present

## 2024-09-06 DIAGNOSIS — Z8501 Personal history of malignant neoplasm of esophagus: Secondary | ICD-10-CM | POA: Insufficient documentation

## 2024-09-06 LAB — CBC WITH DIFFERENTIAL (CANCER CENTER ONLY)
Abs Immature Granulocytes: 0.03 K/uL (ref 0.00–0.07)
Basophils Absolute: 0 K/uL (ref 0.0–0.1)
Basophils Relative: 1 %
Eosinophils Absolute: 0.2 K/uL (ref 0.0–0.5)
Eosinophils Relative: 5 %
HCT: 41.5 % (ref 39.0–52.0)
Hemoglobin: 12.5 g/dL — ABNORMAL LOW (ref 13.0–17.0)
Immature Granulocytes: 1 %
Lymphocytes Relative: 14 %
Lymphs Abs: 0.7 K/uL (ref 0.7–4.0)
MCH: 28.9 pg (ref 26.0–34.0)
MCHC: 30.1 g/dL (ref 30.0–36.0)
MCV: 96.1 fL (ref 80.0–100.0)
Monocytes Absolute: 0.8 K/uL (ref 0.1–1.0)
Monocytes Relative: 16 %
Neutro Abs: 3.3 K/uL (ref 1.7–7.7)
Neutrophils Relative %: 63 %
Platelet Count: 227 K/uL (ref 150–400)
RBC: 4.32 MIL/uL (ref 4.22–5.81)
RDW: 14.3 % (ref 11.5–15.5)
WBC Count: 5.2 K/uL (ref 4.0–10.5)
nRBC: 0 % (ref 0.0–0.2)

## 2024-09-06 LAB — CMP (CANCER CENTER ONLY)
ALT: 29 U/L (ref 0–44)
AST: 28 U/L (ref 15–41)
Albumin: 3.7 g/dL (ref 3.5–5.0)
Alkaline Phosphatase: 67 U/L (ref 38–126)
Anion gap: 8 (ref 5–15)
BUN: 13 mg/dL (ref 8–23)
CO2: 32 mmol/L (ref 22–32)
Calcium: 8.9 mg/dL (ref 8.9–10.3)
Chloride: 100 mmol/L (ref 98–111)
Creatinine: 0.95 mg/dL (ref 0.61–1.24)
GFR, Estimated: >60 mL/min (ref >60)
Glucose, Bld: 88 mg/dL (ref 70–99)
Potassium: 3.5 mmol/L (ref 3.5–5.1)
Sodium: 140 mmol/L (ref 135–145)
Total Bilirubin: 0.5 mg/dL (ref 0.0–1.2)
Total Protein: 6.6 g/dL (ref 6.5–8.1)

## 2024-09-06 LAB — PSA: Prostatic Specific Antigen: 0.48 ng/mL (ref 0.00–4.00)

## 2024-09-06 MED ORDER — IOHEXOL 300 MG/ML  SOLN
100.0000 mL | Freq: Once | INTRAMUSCULAR | Status: AC | PRN
  Administered 2024-09-06: 100 mL via INTRAVENOUS

## 2024-09-07 LAB — CEA: CEA: 3.3 ng/mL (ref 0.0–4.7)

## 2024-09-11 ENCOUNTER — Inpatient Hospital Stay (HOSPITAL_BASED_OUTPATIENT_CLINIC_OR_DEPARTMENT_OTHER): Admitting: Oncology

## 2024-09-11 ENCOUNTER — Encounter: Payer: Self-pay | Admitting: Oncology

## 2024-09-11 VITALS — BP 110/59 | HR 88 | Temp 98.1°F | Resp 18 | Wt 177.7 lb

## 2024-09-11 DIAGNOSIS — C159 Malignant neoplasm of esophagus, unspecified: Secondary | ICD-10-CM

## 2024-09-11 DIAGNOSIS — C61 Malignant neoplasm of prostate: Secondary | ICD-10-CM

## 2024-09-11 DIAGNOSIS — J449 Chronic obstructive pulmonary disease, unspecified: Secondary | ICD-10-CM

## 2024-09-11 DIAGNOSIS — Z8501 Personal history of malignant neoplasm of esophagus: Secondary | ICD-10-CM | POA: Diagnosis not present

## 2024-09-11 NOTE — Assessment & Plan Note (Addendum)
 S/p concurrent chemotherapy carboplatin /Taxol  weekly with radiation. S/p concurrent chemotherapy and radiation [finished 07/26/22] Repeat EGD showed no evidence of malignancy. Dec 2023 PET showed CR  CT scan showed no evidence of recurrence-patient has some chronic esophageal thickness, further recurrence cannot be ruled out.  He is at Very high risk for additional endoscopic surveillance. Recommend continued surveillance CT scan, next in 6 months

## 2024-09-11 NOTE — Progress Notes (Signed)
 Hematology/Oncology Progress note [phone] [phone]     Clinic Day:  09/11/2024   Referring physician: Claudene Rayfield HERO, MD  Assessment & Plan:  Cancer Staging  Primary esophageal squamous cell carcinoma (HCC) Staging form: Esophagus - Squamous Cell Carcinoma, AJCC 8th Edition - Clinical: Stage II (cT3, cN0, cM0, L: Lower) - Signed by Babara Call, MD on 05/31/2022  Prostate cancer [facility]) Staging form: Prostate, AJCC 8th Edition - Clinical: cT2c, cN0, cM0, Grade Group: 2 - Signed by Babara Call, MD on 05/04/2022   Primary esophageal squamous cell carcinoma (HCC) S/p concurrent chemotherapy carboplatin /Taxol  weekly with radiation. S/p concurrent chemotherapy and radiation [finished 07/26/22] Repeat EGD showed no evidence of malignancy. Dec 2023 PET showed CR  CT scan showed no evidence of recurrence-patient has some chronic esophageal thickness, further recurrence cannot be ruled out.  He is at Very high risk for additional endoscopic surveillance. Recommend continued surveillance CT scan, next in 6 months    Prostate cancer (HCC) Gleason score 3+4, status post definitive radiation. Has not followed up with urology Dr. Francisca.   PSA is stable.   COPD with hypoxia (HCC) He is on nasal cannula oxygen .  Follow-up with pulmonology.    Orders Placed This Encounter  Procedures   CT CHEST ABDOMEN PELVIS W CONTRAST    Standing Status:   Future    Expected Date:   03/14/2025    Expiration Date:   09/11/2025    If indicated for the ordered procedure, I authorize the administration of contrast media per Radiology protocol:   Yes    Does the patient have a contrast media/X-ray dye allergy ?:   No    Preferred imaging location?:   [facility]    If indicated for the ordered procedure, I authorize the administration of oral contrast media per Radiology protocol:   Yes   CEA    Standing Status:   Future    Expected Date:   03/11/2025    Expiration Date:    06/09/2025   CBC with Differential (Cancer Center Only)    Standing Status:   Future    Expected Date:   03/11/2025    Expiration Date:   06/09/2025   CMP (Cancer Center only)    Standing Status:   Future    Expected Date:   03/11/2025    Expiration Date:   06/09/2025    Follow up 6 months.  The patient understands the plans discussed today and is in agreement with them.  He knows to contact our office if he develops concerns prior to his next appointment.   Call Babara, MD      Chief complaints: follow up for esophageal squamous cell carcinoma  History of Present Illness  Oncology History  Prostate cancer (HCC)  10/11/2020 Initial Diagnosis   Prostate cancer (HCC)   05/04/2022 Cancer Staging   Staging form: Prostate, AJCC 8th Edition - Clinical: cT2c, cN0, cM0, Grade Group: 2 - Signed by Babara Call, MD on 05/04/2022 Gleason score: 7 Histologic grading system: 5 grade system   07/22/2023 Imaging   CT chest abdomen pelvis w contrast showed 1. Similar mild distal esophageal wall thickening, without well-defined mass. No evidence of metastatic disease. 2. Esophageal air fluid level suggests dysmotility or gastroesophageal reflux. 3. Bilateral adrenal nodules, similar back to 2019 and presumably adenomas. No specific follow-up indicated. 4. Mild common duct dilatation which is unchanged back to at least 2019 and most likely within normal variation. This could be correlated with bilirubin levels.  5. Incidental findings, including: Aortic atherosclerosis (ICD10-I70.0), coronary artery atherosclerosis and emphysema (ICD10-J43.9).   Primary esophageal squamous cell carcinoma (HCC)  04/19/2022 Procedure   04/19/2022 EGD by Dr. Maryruth showed partially obstructive likely malignant esophageal tumor was found at GE junction.  Biopsied.  Normal stomach and normal examined duodenum. Biopsy pathology showed fragments of the squamous mucosa with high-grade dysplastic.  Scant gastric mucosa present with  reactive changes and no dysplastic or significant intestinal metaplastic.  Fragments of fibrillar purulent ulcer diabetes also present.  04/27/2022, repeat EGD and a biopsy of GE junction partially obstructed ulcerating mass. Repeat biopsy pathology showed superficial and disaggregated fragments of squamous mucosa with at least severe dysplastic/squamous cell carcinoma in situ.  Definite invasion cannot be identified.  In part secondary to specimen fragmentation and tangential sectioning.    04/27/2022 Imaging   MRI abdomen MRCP with and without contrast showed bilateral benign adrenal adenoma.  Small hepatic hemangioma and small right hepatic parenchymal vascular anomalies. Small hiatal hernia.  Colonic diverticulosis.   05/04/2022 Initial Diagnosis   Primary esophageal squamous cell carcinoma  + Stomach discomfort for 4 months, dysphagia for both solids and liquids at times. + Weight loss   05/04/2022 Cancer Staging   Staging form: Esophagus - Squamous Cell Carcinoma, AJCC 8th Edition - Clinical: Stage II (cT3, cN0, cM0, L: Lower) - Signed by Babara Call, MD on 05/31/2022 Stage prefix: Initial diagnosis Total positive nodes: 0   05/09/2022 Imaging   PET scan showed intense tracer uptake associated with the distal esophageal mass just above the level of the GE junction compatible with primary esophageal neoplasm. No signs of tracer avid nodal metastasis or solid organ metastasis.   05/20/2022 Procedure   EUS by Dr.Spaete.  T3,Nx 05/20/2022, esophagus mass biopsy showed invasive moderately differentiated squamous cell carcinoma, with basaloid features and minimal keratinization.  Background squamous cell carcinoma in situ.   06/03/2022 -  Chemotherapy   Patient is on Treatment Plan :  ESOPHAGUS Carboplatin /PACLitaxel  weekly x 6 weeks with XRT       07/09/2022 - 07/12/2022 Hospital Admission   Hospitalization due to COPD exacerbation.     09/03/2022 Imaging   PET restaging  1. Similar symmetric  distal esophageal wall thickening with significant reduction in the abnormal FDG avidity, now with a small focus of minimally metabolic activity, nonspecific and possibly reflecting posttreatment change or residual tumor. Suggest continued attention on follow-up imaging. 2. No evidence of hypermetabolic metastatic disease in the chest, abdomen or pelvis. 3. Aortic Atherosclerosis (ICD10-I70.0) and Emphysema (ICD10-J43.9).   11/08/2022 Procedure   Repeat EGD by Dr. Maryruth showed  -Non-severe radiation esophagitis with no bleeding. Biopsied.-Pathology showed mild acute esophagitis.  No evidence of Barrett mucosa or dysplasia, no evidence of atypia or malignancy. - Small hiatal hernia. - Normal stomach. - Normal examined duodenum     12/08/2022 Imaging   Repeat PET scan showed interval resolution of residual hypermetabolism within the distal esophagus consistent with response to therapy.  No evidence of local recurrence or metastatic disease.  Stable bilateral adrenal adenomas.  Aortic atherosclerosis.   03/11/2023 Imaging   CT chest abdomen pelvis wo contrast  1. Stable mild to moderate distal esophageal wall thickening without discrete mass. No paraesophageal lymphadenopathy. 2. No findings for metastatic disease involving the chest, abdomen or pelvis. 3. Stable bilateral adrenal gland adenomas.   Aortic Atherosclerosis (ICD10-I70.0) and Emphysema (ICD10-J43.9).     Patient has multiple comorbidities including COPD/chronic respiratory failure, history of STEMI/combined systolic and diastolic CHF [  last LVEF 40 to 45%], lung nodules previously avid on PET scan, biopsy negative and was felt to be calcified granulomas.-Pulmonology Dr. Tamea Patient lives by himself.  Independent with ADL and some of the iADL  Interval History Leslie Sims is here today for repeat clinical assessment. He denies fevers or chills. He denies pain. His appetite is fair His weight is stable. He reports feeling  well.  Patient denies swallowing difficulties. + Shortness of breath due to hypoxia respiratory failure.   recent pneumonia.  Patient was treated with antibiotics.   Review of Systems  Constitutional:  Negative for appetite change, chills, fatigue, fever and unexpected weight change.  HENT:   Negative for hearing loss and voice change.   Eyes:  Negative for eye problems and icterus.  Respiratory:  Positive for shortness of breath. Negative for chest tightness and cough.        Chronic respiratory failure on oxygen   Cardiovascular:  Negative for leg swelling.  Gastrointestinal:  Negative for abdominal distention, abdominal pain and constipation.  Endocrine: Negative for hot flashes.  Genitourinary:  Negative for difficulty urinating, dysuria and frequency.   Musculoskeletal:  Negative for arthralgias.  Skin:  Negative for itching and rash.  Neurological:  Negative for light-headedness and numbness.  Hematological:  Negative for adenopathy. Does not bruise/bleed easily.  Psychiatric/Behavioral:  Negative for confusion.      VITALS:  Blood pressure (!) 110/59, pulse 88, temperature 98.1 F (36.7 C), temperature source Tympanic, resp. rate 18, weight 177 lb 11.2 oz (80.6 kg), SpO2 92%, peak flow (!) 3 L/min.  Wt Readings from Last 3 Encounters:  09/11/24 177 lb 11.2 oz (80.6 kg)  08/07/24 171 lb (77.6 kg)  07/13/24 181 lb (82.1 kg)    Body mass index is 25.5 kg/m.  Performance status (ECOG): 2 - Symptomatic, <50% confined to bed  PHYSICAL EXAM:  Physical Exam Constitutional:      General: He is not in acute distress.    Appearance: He is obese. He is not diaphoretic.  HENT:     Head: Normocephalic and atraumatic.  Eyes:     General: No scleral icterus. Cardiovascular:     Rate and Rhythm: Normal rate.     Heart sounds: No murmur heard. Pulmonary:     Effort: Pulmonary effort is normal. No respiratory distress.     Comments: Patient is on nasal cannula oxygen  Abdominal:      General: There is no distension.     Palpations: Abdomen is soft.  Musculoskeletal:        General: Normal range of motion.     Cervical back: Normal range of motion.  Skin:    Findings: No erythema.  Neurological:     Mental Status: He is alert and oriented to person, place, and time. Mental status is at baseline.     Motor: No abnormal muscle tone.  Psychiatric:        Mood and Affect: Mood and affect normal.      LABS:      Latest Ref Rng & Units 09/06/2024    1:24 PM 08/07/2024   11:56 AM 06/10/2024    9:52 AM  CBC  WBC 4.0 - 10.5 K/uL 5.2  11.3  12.6   Hemoglobin 13.0 - 17.0 g/dL 87.4  87.4  87.8   Hematocrit 39.0 - 52.0 % 41.5  42.7  40.0   Platelets 150 - 400 K/uL 227  359  294       Latest Ref Rng &  Units 09/06/2024    1:26 PM 08/07/2024   11:56 AM 07/13/2024    3:23 PM  CMP  Glucose 70 - 99 mg/dL 88  883  98   BUN 8 - 23 mg/dL 13  10  18    Creatinine 0.61 - 1.24 mg/dL 9.04  9.24  9.24   Sodium 135 - 145 mmol/L 140  143  141   Potassium 3.5 - 5.1 mmol/L 3.5  4.1  3.5   Chloride 98 - 111 mmol/L 100  101  103   CO2 22 - 32 mmol/L 32  33  32   Calcium  8.9 - 10.3 mg/dL 8.9  9.3  8.6   Total Protein 6.5 - 8.1 g/dL 6.6     Total Bilirubin 0.0 - 1.2 mg/dL 0.5     Alkaline Phos 38 - 126 U/L 67     AST 15 - 41 U/L 28     ALT 0 - 44 U/L 29        STUDIES:  CT CHEST ABDOMEN PELVIS W CONTRAST Result Date: 09/10/2024 CLINICAL DATA:  Esophageal cancer restaging.  Prostate cancer. * Tracking Code: BO * EXAM: CT CHEST, ABDOMEN, AND PELVIS WITH CONTRAST TECHNIQUE: Multidetector CT imaging of the chest, abdomen and pelvis was performed following the standard protocol during bolus administration of intravenous contrast. RADIATION DOSE REDUCTION: This exam was performed according to the departmental dose-optimization program which includes automated exposure control, adjustment of the mA and/or kV according to patient size and/or use of iterative reconstruction technique.  CONTRAST:  OMNIPAQUE  IOHEXOL  300 MG/ML  SOLN COMPARISON:  Multiple exams, including 06/10/2024 and 01/26/2024 FINDINGS: CT CHEST FINDINGS Cardiovascular: Coronary, aortic arch, and branch vessel atherosclerotic vascular disease. Fat density along the left ventricular apex and adjacent interventricular septum could be related to a prior myocardial infarction, correlate with patient history. Mediastinum/Nodes: Wall thickening observed in the distal 30% of the thoracic esophagus, nonspecific for esophagitis versus tumor. No pathologic adenopathy observed. Homogeneous presumably calcified 1.1 cm right axillary lymph node on image 14 series 2 is unchanged. Lungs/Pleura: Improved aeration in both lung apices although with some residual bandlike density and adjacent ground-glass opacity at the right lung apex. Consolidation anteriorly in the right upper lobe is reduced although there still some bandlike density and pleural thickening as on image 72 series 4. Airway thickening is present, suggesting bronchitis or reactive airways disease. Emphysema noted. Subpleural consolidation anteriorly in the left upper lobe is substantially reduced. Mild new airspace opacity in the superior segment left lower lobe as on image 53 series 4, likely inflammatory or infectious. Densely calcified 9 mm left upper lobe nodule, likely postinflammatory such as a calcified granuloma. Mild lingular scarring. Minimal scarring laterally at the left lung base appears unchanged. Musculoskeletal: Stable lucent 1.3 by 0.7 cm lesion in the right anterior third rib on image 52 series 4, this is been present at least since 09/13/2018 and accordingly is not considered an active process. 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 Lower thoracic spondylosis. CT ABDOMEN PELVIS FINDINGS Hepatobiliary: No significant abnormal hypermetabolic activity in this region. Pancreas: Unremarkable Spleen: Punctate calcifications compatible with old  granulomatous disease. Adrenals/Urinary Tract: Chronic 2.7 by 2.3 cm left adrenal mass on image 67 series 2, shown on prior imaging to be an adenoma with internal density of 6 Hounsfield units on prior PET-CT noncontrast CT images from 12/06/2022. Similar 1.4 by 0.9 cm right adrenal adenoma noncontrast density of -3 Hounsfield units on the prior PET-CT.  No further imaging workup of these lesions is indicated. The kidneys appear unremarkable. Stomach/Bowel: Sigmoid colon diverticulosis. Upper normal caliber loops of jejunum in the left upper quadrant with air-fluid levels, cannot exclude mild proximal enteritis but no bowel wall thickening is observed. Vascular/Lymphatic: Atherosclerosis is present, including aortoiliac atherosclerotic disease. Reproductive: Penile prosthesis observed, collapsed reservoir suggesting past leak from the reservoir. Prostate fiducials noted. Other: No supplemental non-categorized findings. Musculoskeletal: Right groin hernia contains adipose tissue. Bridging spurring of the left sacroiliac joint. Moderate degenerative hip arthropathy bilaterally. Lower lumbar spondylosis and degenerative disc disease with suspected right foraminal impingement at L4-5 at L5-S1. IMPRESSION: 1. Wall thickening in the distal 30% of the thoracic esophagus, nonspecific for esophagitis versus tumor. 2. Improved aeration in both lung apices although with some residual bandlike density and adjacent ground-glass opacity at the right lung apex. Consolidation anteriorly in the right upper lobe is reduced although there still some bandlike density and pleural thickening. Subpleural consolidation anteriorly in the left upper lobe is substantially reduced. 3. Mild new airspace opacity in the superior segment left lower lobe, likely inflammatory or infectious, but surveillance is recommended. 4. Airway thickening is present, suggesting bronchitis or reactive airways disease. 5. Stable lucent lesion in the right anterior  third rib, this has been present at least since 09/13/2018 and accordingly is not considered an active process. 6. Stable bilateral adrenal adenomas. 7. Sigmoid colon diverticulosis. 8. Right groin hernia contains adipose tissue. 9. Lower lumbar spondylosis and degenerative disc disease with suspected right foraminal impingement at L4-5 and L5-S1. 10. Fat density along the left ventricular apex and adjacent interventricular septum could be related to a prior myocardial infarction, correlate with patient history. 11. Aortic Atherosclerosis (ICD10-I70.0) and Emphysema (ICD10-J43.9). Electronically Signed   By: Ryan Salvage M.D.   On: 09/10/2024 15:26   DG Chest Port 1 View Result Date: 08/07/2024 EXAM: 2 FRONTAL VIEWS XRAY OF THE CHEST 08/07/2024 12:24:48 PM COMPARISON: 04/24/2024 CLINICAL HISTORY: [id number] SOB (shortness of breath) [id number]. Table formatting from the original note was not included.; Pt sts that he has been having SOB for the last three weeks. Pt sts that he has been wanting to come but just hasn't been able to get here. Pt sts that he wears 3L/min chronically at home FINDINGS: LUNGS AND PLEURA: No focal pulmonary opacity. No pulmonary edema. No pleural effusion. No pneumothorax. Resolved left upper lobe pneumonia. Interstitial / peribronchial thickening. HEART AND MEDIASTINUM: No acute abnormality of the cardiac and mediastinal silhouettes. Atherosclerosis in the transverse aorta. BONES AND SOFT TISSUES: No acute osseous abnormality. Multiple wires and leads project over the chest on the frontal radiograph. Apical lordotic positioning. Metallic object projecting over the left side of the chest is presumably external to the patient. IMPRESSION: 1. No acute findings. 2. Interstitial thickening likely related to smoking/chronic bronchitis. Electronically signed by: Rockey Kilts MD 08/07/2024 01:01 PM EDT RP Workstation: [id number]      Past Medical History:  Diagnosis Date   Allergic rhinitis     Anxiety and depression    Back pain    Cancer (HCC)    prostate   Colon polyps 08/2012   colonoscopy; multiple colon polyps; repeat colonoscopy in one year.   COPD (chronic obstructive pulmonary disease) (HCC)    a. on home O2.   Coronary artery disease 11/2009   a. late presenting ant MI; b. LHC 100% pLAD s/p PCI/DES, 99% mRCA s/p PCI/DES, EF 35%; c. nuclear stress test 05/13: prior ant/inf infarcts w/o ischemia, EF 43%;  d. LHC 02/14: widely patent stents with no other obs dz, EF 40%; e. LHC 11/17: LM nl, mLAD 10%, patent LAD stent, dLAD 20%, p-mRCA 10%, mRCA 40%, patent RCA stent, EF 35-45%    Diabetes (HCC)    Dysphagia    GERD (gastroesophageal reflux disease)    Headache(784.0)    Helicobacter pylori (H. pylori)    Hernia    inguinal   HFrEF (heart failure with reduced ejection fraction) (HCC)    a. 10/2016 LV gram: EF 35-45%; b. 06/2018 Echo: EF 40-45%. c. 01/2021 Echo: EF 40-45%   Hyperlipidemia    Hypertension    Insomnia    Myalgia    Peptic ulcer    Pulmonary nodules    Status post dilation of esophageal narrowing 2000   Thickening of esophagus    Tinea pedis    Wears dentures    partial upper    Past Surgical History:  Procedure Laterality Date    coronary stents     Admission  12/20/2012   COPD exacerbation.  [facility].   CARDIAC CATHETERIZATION     CARDIAC CATHETERIZATION  02/05/2013   [facility]   CARDIAC CATHETERIZATION  09/19/2013   [facility] : patent stents with no change in anatomy. EF: 40$   CARDIAC CATHETERIZATION Left 11/12/2016   Procedure: Left Heart Cath and Coronary Angiography;  Surgeon: Deatrice DELENA Cage, MD;  Location: [facility] INVASIVE CV LAB;  Service: Cardiovascular;  Laterality: Left;   COLONOSCOPY     COLONOSCOPY WITH PROPOFOL  N/A 05/18/2016   Procedure: COLONOSCOPY WITH PROPOFOL ;  Surgeon: Rogelia Copping, MD;  Location: [facility] ENDOSCOPY;  Service: Endoscopy;  Laterality: N/A;   COLONOSCOPY WITH PROPOFOL  N/A 03/19/2020   Procedure: COLONOSCOPY WITH PROPOFOL ;   Surgeon: Unk Corinn Skiff, MD;  Location: [facility] ENDOSCOPY;  Service: Gastroenterology;  Laterality: N/A;   CORONARY ANGIOPLASTY WITH STENT PLACEMENT  09/19/2009   LAD 3.0 X23 mm Xience DES, RCA: 4.0 X 15 mm Xience DES   ELECTROMAGNETIC NAVIGATION BROCHOSCOPY N/A 10/27/2017   Procedure: ELECTROMAGNETIC NAVIGATION BRONCHOSCOPY;  Surgeon: Isaiah Scrivener, MD;  Location: [facility] ORS;  Service: Cardiopulmonary;  Laterality: N/A;   ESOPHAGEAL DILATION     ESOPHAGOGASTRODUODENOSCOPY  12/20/2006   ESOPHAGOGASTRODUODENOSCOPY  08/20/2012   ESOPHAGOGASTRODUODENOSCOPY (EGD) WITH PROPOFOL  N/A 04/19/2022   Procedure: ESOPHAGOGASTRODUODENOSCOPY (EGD) WITH PROPOFOL ;  Surgeon: Maryruth Ole DASEN, MD;  Location: [facility] ENDOSCOPY;  Service: Endoscopy;  Laterality: N/A;   ESOPHAGOGASTRODUODENOSCOPY (EGD) WITH PROPOFOL  N/A 04/27/2022   Procedure: ESOPHAGOGASTRODUODENOSCOPY (EGD) WITH PROPOFOL ;  Surgeon: Maryruth Ole DASEN, MD;  Location: [facility] ENDOSCOPY;  Service: Endoscopy;  Laterality: N/A;   ESOPHAGOGASTRODUODENOSCOPY (EGD) WITH PROPOFOL  N/A 11/08/2022   Procedure: ESOPHAGOGASTRODUODENOSCOPY (EGD) WITH PROPOFOL ;  Surgeon: Maryruth Ole DASEN, MD;  Location: [facility] ENDOSCOPY;  Service: Endoscopy;  Laterality: N/A;   EUS N/A 05/20/2022   Procedure: UPPER ENDOSCOPIC ULTRASOUND (EUS) LINEAR;  Surgeon: Elta Fonda SQUIBB, MD;  Location: [facility] ENDOSCOPY;  Service: Gastroenterology;  Laterality: N/A;  [facility]   HERNIA REPAIR  07/20/2012   L inguinal hernia repair   PENILE PROSTHESIS IMPLANT     RIGHT/LEFT HEART CATH AND CORONARY ANGIOGRAPHY Bilateral 04/04/2023   Procedure: RIGHT/LEFT HEART CATH AND CORONARY ANGIOGRAPHY;  Surgeon: Cage Deatrice DELENA, MD;  Location: [facility] INVASIVE CV LAB;  Service: Cardiovascular;  Laterality: Bilateral;    Family History  Problem Relation Age of Onset   Heart attack Brother        Brother #1   Diabetes Brother    Hypertension Brother        #  3   Coronary artery disease Father 67        deceased   Heart attack Father    Diabetes Father    Heart disease Father    COPD Mother 17       deceased   Alcohol abuse Sister        polysubstance abuse   COPD Sister    Lung cancer Sister    Alcohol abuse Sister        polysubstance abuse   Penile cancer Brother    Diabetes Brother    Prostate cancer Neg Hx    Bladder Cancer Neg Hx    Kidney cancer Neg Hx     Social History:  reports that he quit smoking about 5 years ago. His smoking use included cigarettes. He started smoking about 47 years ago. He has a 126 pack-year smoking history. He has never used smokeless tobacco. He reports that he does not drink alcohol and does not use drugs.  Allergies:  Allergies  Allergen Reactions   Prozac  [Fluoxetine  Hcl] Shortness Of Breath   Hydroxyzine     Bupropion Other (See Comments)    Makes me feel funny Other reaction(s): Other (See Comments) Unknown/made me feel real funny Makes me feel funny Makes me feel funny Other reaction(s): Other (See Comments) Unknown/made me feel real funny Makes me feel funny   Effexor Xr [Venlafaxine Hcl Er] Other (See Comments)    Makes me feel funny    Current Medications: Current Outpatient Medications  Medication Sig Dispense Refill   albuterol  (VENTOLIN  HFA) 108 (90 Base) MCG/ACT inhaler TAKE 2 PUFFS BY MOUTH EVERY 6 HOURS AS NEEDED FOR WHEEZE OR SHORTNESS OF BREATH 18 each 2   ALPRAZolam  (XANAX ) 0.5 MG tablet Take 0.5 mg by mouth 3 (three) times daily.     aspirin  81 MG tablet Take 81 mg by mouth daily.     atorvastatin  (LIPITOR) 80 MG tablet Take 1 tablet (80 mg total) by mouth daily. 90 tablet 3   azithromycin  (ZITHROMAX ) 250 MG tablet Take 1 tablet (250 mg total) by mouth 3 (three) times a week. M-W-F 12 tablet 2   bisoprolol  (ZEBETA ) 5 MG tablet TAKE 1/2 TABLET BY MOUTH DAILY 45 tablet 2   budesonide  (PULMICORT ) 0.5 MG/2ML nebulizer solution Take 2 mLs (0.5 mg total) by nebulization in the morning and at bedtime. 360 mL 10    dapagliflozin  propanediol (FARXIGA ) 10 MG TABS tablet TAKE 1 TABLET BY MOUTH DAILY BEFORE BREAKFAST. 30 tablet 11   Ensifentrine  (OHTUVAYRE ) 3 MG/2.5ML SUSP Inhale 3 mLs into the lungs 2 (two) times daily. DX:J44.9 450 mL 2   ezetimibe  (ZETIA ) 10 MG tablet Take 1 tablet (10 mg total) by mouth daily. 90 tablet 3   famotidine  (PEPCID ) 20 MG tablet TAKE 1 TABLET (20 MG TOTAL) BY MOUTH 2 (TWO) TIMES DAILY. FOR BREAKTHROUGH REFLUX 180 tablet 3   FLUoxetine  (PROZAC ) 20 MG tablet Take 20 mg by mouth daily.     fluticasone  (FLONASE ) 50 MCG/ACT nasal spray Place 2 sprays into both nostrils daily.     furosemide  (LASIX ) 20 MG tablet TAKE 1 TABLET BY MOUTH AS NEEDED 90 tablet 1   ipratropium-albuterol  (DUONEB) 0.5-2.5 (3) MG/3ML SOLN Take 3 mLs by nebulization every 6 (six) hours as needed. 360 mL 11   ketoconazole  (NIZORAL ) 2 % cream Apply 1 Application topically daily.     loratadine  (CLARITIN ) 10 MG tablet Take 10 mg by mouth daily.     mirtazapine  (REMERON ) 45 MG  tablet Take 45 mg by mouth at bedtime.     Multiple Vitamin (MULTIVITAMIN) capsule Take 1 capsule by mouth daily.     nitroGLYCERIN  (NITROSTAT ) 0.4 MG SL tablet Place 1 tablet (0.4 mg total) under the tongue every 5 (five) minutes as needed. 25 tablet 3   OXYGEN  Inhale 3 L into the lungs daily.     pantoprazole  (PROTONIX ) 40 MG tablet Take 40 mg by mouth 2 (two) times daily before a meal.     sacubitril -valsartan  (ENTRESTO ) 49-51 MG Take 1 tablet by mouth 2 (two) times daily.     Spacer/Aero-Holding Chambers (AEROCHAMBER MV) inhaler Use as instructed 1 each 2   spironolactone  (ALDACTONE ) 25 MG tablet Take 0.5 tablets (12.5 mg total) by mouth daily. 90 tablet 3   sucralfate  (CARAFATE ) 1 g tablet Take 1 tablet (1 g total) by mouth 3 (three) times daily with meals. 1 hour before meals and medications 90 tablet 2   No current facility-administered medications for this visit.

## 2024-09-11 NOTE — Assessment & Plan Note (Signed)
 He is on nasal cannula oxygen .  Follow-up with pulmonology.

## 2024-09-11 NOTE — Assessment & Plan Note (Addendum)
Gleason score 3+4, status post definitive radiation. Has not followed up with urology Dr. Richardo Hanks.   PSA is stable.

## 2024-09-12 ENCOUNTER — Encounter: Payer: Self-pay | Admitting: Oncology

## 2024-09-13 ENCOUNTER — Telehealth: Payer: Self-pay

## 2024-09-13 ENCOUNTER — Encounter: Payer: Self-pay | Admitting: Pulmonary Disease

## 2024-09-13 ENCOUNTER — Ambulatory Visit: Admitting: Pulmonary Disease

## 2024-09-13 VITALS — BP 108/68 | HR 72 | Temp 97.7°F | Ht 70.0 in | Wt 180.2 lb

## 2024-09-13 DIAGNOSIS — J441 Chronic obstructive pulmonary disease with (acute) exacerbation: Secondary | ICD-10-CM

## 2024-09-13 DIAGNOSIS — J479 Bronchiectasis, uncomplicated: Secondary | ICD-10-CM

## 2024-09-13 DIAGNOSIS — J449 Chronic obstructive pulmonary disease, unspecified: Secondary | ICD-10-CM

## 2024-09-13 DIAGNOSIS — J440 Chronic obstructive pulmonary disease with acute lower respiratory infection: Secondary | ICD-10-CM | POA: Diagnosis not present

## 2024-09-13 DIAGNOSIS — Z87891 Personal history of nicotine dependence: Secondary | ICD-10-CM | POA: Diagnosis not present

## 2024-09-13 LAB — NITRIC OXIDE: Nitric Oxide: 30

## 2024-09-13 MED ORDER — METHYLPREDNISOLONE 4 MG PO TBPK: ORAL_TABLET | ORAL | 0 refills | Status: DC

## 2024-09-13 MED ORDER — AZITHROMYCIN 500 MG PO TABS: 500.0000 mg | ORAL_TABLET | Freq: Every day | ORAL | 0 refills | Status: DC

## 2024-09-13 NOTE — Progress Notes (Addendum)
 Subjective:    Patient ID: Leslie Sims, male    DOB: July 30, 1951, 73 y.o.   MRN: [id number]  Patient Care Team: Claudene [place] HERO, MD as PCP - General (Family Medicine) Darron Deatrice LABOR, MD as PCP - Cardiology (Cardiology) Darron Deatrice LABOR, MD as Consulting Physician (Cardiology) Verdene Gills, RN as Registered Nurse Tamea Dedra CROME, MD as Consulting Physician (Pulmonary Disease) Lenn Aran, MD as Radiation Oncologist (Radiation Oncology) Maurie [place] BIRCH, RN as Oncology Nurse Navigator Babara Call, MD as Consulting Physician (Oncology) Edith Rogue, MD (Neurology) Babara Call, MD as Consulting Physician (Oncology)  Chief Complaint  Patient presents with   COPD    Wheezing and shortness of breath.     BACKGROUND/INTERVAL:72 yo HERO, former smoker quit 02/10/2019 with severe emphysema seen by multiple pulmonologists in past ([place], [place], [place], [place], [place]) followed here for management of COPD. Last seen by  me on 12 July 2024. He has been on Ohtuvayre  as well as DuoNeb via neb.  He has also an ischemic cardiomyopathy and prior history of esophageal cancer.  Has chronic respiratory failure with hypoxia on chronic oxygen  therapy.  Admitted to [facility] with COPD exacerbation on 26 May and then subsequently with pneumonia on 22nd June.   HPI Discussed the use of AI scribe software for clinical note transcription with the patient, who gave verbal consent to proceed.  History of Present Illness   Leslie Sims is a 73 year old male with COPD who presents with difficulty breathing and mucus retention.  He experiences difficulty breathing and a sensation of phlegm that is not expectorated. He has not been using Mucinex  or similar medications to address this issue. He recently started therapy with a vest machine, which he describes as expensive but potentially beneficial. He has not been able to use the vest machine for the last two nights due to feeling unwell, although he  has used it in the mornings.  He has been informed about a tea, possibly mullein, which his daughter ordered for him to help with mucus loosening. He forgot to bring it to the appointment but plans to use it.   He is currently not taking any Mucinex  but recalls using it previously, which his daughter disapproved of because it was not doctor-approved. He recalls confusion with Robitussin, which he had taken before.  He reports wheezing. He is also experiencing mucus production with light green sputum.  No fevers or chills.  No hemoptysis.  DATA/EVENTS: 07/24/12 Office Spirometry: Severe obstruction (FEV1 31% pred) 08/31/13 Office Spirometry: very severe obstruction (FEV1 25% pred) 06/18/15 CT chest: Severe emphysema 03/03/17 Overnight oximetry: Lowest SpO2 72%. 95% of time SpO2 was below 90% 03/03/17 : 384 meters. Desat to 84% 10/04/17 CT chest: Two new irregularly marginated nodules, one in the RUL, and a second in the mid LUL worrisome for either synchronous lung ca or metastatic involvement of the lungs. The two small nodules described on the prior dictated report have resolved in the left superior segment and most likely were postinflammatory. Diffuse centrilobular emphysema. 10/13/17 PET scan: The new left upper lobe nodule is FDG avid consistent with malignancy. Recommend tissue confirmation. The new nodule in the right upper lobe demonstrates low level uptake. While the level of uptake suggests the possibility of an inflammatory or infectious process, a low-grade malignancy is not excluded. Given the suspected malignancy on the left, tissue confirmation should be considered on the right despite the low level of uptake. No distant metastatic disease identified 10/27/17  ENB ([place]): Transbronchial Fine Needle Aspirations 21G X7, Transbronchial Forceps Biopsy X6. ALVEOLATED LUNG TISSUE WITH HEMOSIDERIN LADEN MACROPHAGES.  NEGATIVE FOR ATYPIA AND MALIGNANCY 11/01/17 PFTs: FVC: 2.96 > 3.14 L (68  > 72 %pred), FEV1: 0.94 > 0.93 L (27 %pred), FEV1/FVC: 32%, TLC: 9.04 L (137 %pred), DLCO 62 %pred 12/07/17 CTA chest: No pulmonary embolus identified. Anterior left upper lobe ground-glass and nodular consolidation likely representing pneumonitis. Stable bilateral upper lobe pulmonary nodules. Stable emphysema. 01/21/18 CT chest: new masslike architectural distortion and ground-glass attenuation surrounding the index nodule within the RUL. The appearance favors an inflammatory or infectious process. The index nodule in the left upper lobe is slightly decreased in volume when compared with the previous exam favoring a benign process. Attention on follow-up imaging advise. Advanced changes of emphysema (very severe) 06/01/18 CTA chest: No evidence of pulmonary embolism. Waxing and waning bilateral pulmonary nodular process as described. New 8 mm nodular ill-defined focus of opacification over the lingula which may represent an acute inflammatory or infectious process. No effusion. Emphysema 10/19/18 CTA chest: No acute cardiopulmonary disease and no evidence of pulm embolism. Hospitalization 1/12-1/14/2020: COPD exacerbation Hospitalization 2/22-2/27/20: COPD exacerbation, HCAP 02/11/19 CT chest: Progressed masslike consolidation within the RUL in the region of previous waxing and waning nodule, this is contiguous with the pleural surface and demonstrates spiculated margin and probable small focus of central cavitation. Findings could be secondary to infection superimposed on chronic nodule. However cavitary neoplasm is also a concern. Multiple additional lung nodules which have not significantly changed. Small foci of ground-glass density in the RUL may reflect small focus of infection. Mod-severe emphysema 05/01/19 CT chest: Waxing/waning right lung opacities, suggesting infection, including atypical/mycobacterial infection. Improvement of the prior dominant masslike opacity argues against neoplasm. Stable  left upper lobe nodule, likely benign. Aortic Atherosclerosis and Emphysema 11/02/2019 CT chest: Multiple larger pulmonary nodules and masslike areas seen on the prior study have largely regressed.  Findings indicative of infectious/inflammatory process.  Diffuse bronchial wall thickening with moderate centrilobular and paraseptal emphysema.  Suggestion of left main and three-vessel coronary artery disease. 04/23/2020 CT chest: Multiple bilateral stable pulmonary nodules. New 7 mm nodule over the posterior left upper lobe. Stable nodularity over an area ofright upper lobe/apical scarring with new 9 mm nodular focus along the most inferior aspect of this scarring. This area has shown to wax and wane on previous exams and is most likely inflammatory. 07/24/2020 CT chest: Background of marked pulmonary emphysema with stable post infectious or inflammatory changes and with near complete resolution of a presumably infectious or inflammatory nodule in the LEFT upper lobe as compared to previous exam. 01/21/2021 CT chest: Severe emphysematous changes, pulmonary scarring, bilateral calcified granulomas. 01/29/2021 PFTs: Further decline in lung function.  FEV1 0.74 L or 22% predicted, FVC 1.91 L or 42% predicted.  No significant bronchodilator response.  FEV1/FVC 39%.  Patient could not perform lung volumes. Diffusion capacity severely reduced.  This is consistent with very severe COPD. 02/13/2021 2D echo: LVEF 40 to 45% global hypokinesis, severe hypokinesis of the periapical region.  Grade II diastolic dysfunction, dilated left atrium. 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  Biopsies high-grade dysplastic. 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 05/09/2022 PET/CT: Intense tracer uptake associated with the distal esophageal mass. 06/03/2022 chemotherapy-XRT: Patient began concurrent  carboplatin /Taxol /XRT. 07/26/2022 completed XRT: Completing chemotherapy. 09/02/2022 PET/CT: Distal esophageal wall thickening with significant reduction in abnormal FDG activity, small focus  of minimally metabolic activity, possibly reflecting posttreatment change.  No evidence of hypermetabolic metastatic disease. 09/09/2022 echocardiogram: LVEF 50 to 55%, grade 1 DD, normal RV function, 10/11/2022 chest x-ray: Hyperinflation of the lungs no acute cardiopulmonary disease.  Granuloma of the LEFT upper lobe. 12/06/2022 PET/CT: No evidence of local recurrence or metastatic disease after treatment for esophageal cancer.  Stable bilateral adrenal adenomas. 01/16/2023 chest x-ray: Changes consistent with COPD with no active disease. 01/26/2023 barium swallow: Moderate esophageal dysmotility, markedly delayed passage of barium tablet at the level of the GE junction passing after approximately 20 minutes. 04/04/2023 right and left heart cath: LVEF 25 to 35%, moderately elevated LV end-diastolic pressure, moderate to severe left ventricular systolic dysfunction, moderate to severely reduced LV systolic function with akinesis of mid to distal anterior and apical myocardium.  Right heart catheterization showed normal RA pressure, mild pulmonary hypertension mild elevated wedge pressure and normal cardiac output.  Patient referred to advanced heart failure clinic. 05/02/2023 echocardiogram: LVEF 35 to 40%, moderate decreased function on the LV, LV with regional wall motion abnormalities.  LV internal cavity dilated.  Grade 1 DD severe hypokinesis of the left ventricular, mid apical anterior wall, anterolateral wall and apical segment.  LA mildly dilated.  No valvular abnormalities. 06/10/2024 CT angio chest: No evidence of pulmonary embolism, irregular consolidation within the bilateral lung apices and anterior upper lobes, likely multifocal pneumonia.  Moderate diffuse bronchial wall thickening with multifocal  segmental mucous plugging likely due to bronchitis, there are areas of significant bronchiectasis.  Unchanged left lung nodules.    Review of Systems A 10 point review of systems was performed and it is as noted above otherwise negative.   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   COPD exacerbation (HCC) 07/31/2021   Prostate cancer (HCC) 10/11/2020   Elevated PSA 07/28/2020   Iron deficiency anemia 05/20/2020   Goals of care, counseling/discussion    Palliative care by specialist    Multifocal pneumonia 01/28/2019   COPD with acute exacerbation (HCC) 12/31/2018   Generalized anxiety disorder 11/12/2018   Moderate episode of recurrent major depressive disorder (HCC) 11/12/2018   Chronic respiratory failure with hypoxia (HCC) 09/29/2018   Acute respiratory failure with hypoxia and hypercapnia (HCC) 09/13/2018   Acute on chronic respiratory failure with hypoxia and hypercapnia (HCC) 08/24/2018   Atypical chest pain 07/02/2018   Grief reaction 01/13/2018   COPD with hypoxia (HCC) 12/25/2017   Unstable angina (HCC)    Controlled type 2 diabetes mellitus without complication, without long-term current use of insulin  (HCC) 10/25/2016   Benign neoplasm of ascending colon    Benign neoplasm of descending colon    Benign neoplasm of sigmoid colon    History of colonic polyps    Pulmonary nodules/lesions, multiple 01/05/2015   Bruit of left carotid artery 11/04/2014   Sleep disorder  07/18/2013   Seasonal and perennial allergic rhinitis 05/29/2013   Bradycardia 04/20/2011   SMOKER 07/30/2010   Coronary artery disease 04/23/2010   Hyperlipidemia 03/24/2010   DEPRESSION/ANXIETY 03/24/2010  Chronic heart failure with preserved ejection fraction (HFpEF) (HCC) 03/24/2010   COPD, very severe (HCC) 03/24/2010   GERD (gastroesophageal reflux disease) 03/24/2010   Essential hypertension 11/21/2009    Social History   Tobacco Use   Smoking status: Former    Current packs/day: 0.00    Average packs/day: 3.0 packs/day for 42.0 years (126.0 ttl pk-yrs)    Types: Cigarettes    Start date: 02/10/1977    Quit date: 02/10/2019    Years since quitting: 5.5   Smokeless tobacco: Never   Tobacco comments:    25 months ago most smoked 4 packs a day .  Substance Use Topics   Alcohol use: No    Alcohol/week: 0.0 standard drinks of alcohol    Allergies  Allergen Reactions   Prozac  [Fluoxetine  Hcl] Shortness Of Breath   Hydroxyzine     Bupropion Other (See Comments)    Makes me feel funny Other reaction(s): Other (See Comments) Unknown/made me feel real funny Makes me feel funny Makes me feel funny Other reaction(s): Other (See Comments) Unknown/made me feel real funny Makes me feel funny   Effexor Xr [Venlafaxine Hcl Er] Other (See Comments)    Makes me feel funny    Current Meds  Medication Sig   albuterol  (VENTOLIN  HFA) 108 (90 Base) MCG/ACT inhaler TAKE 2 PUFFS BY MOUTH EVERY 6 HOURS AS NEEDED FOR WHEEZE OR SHORTNESS OF BREATH   ALPRAZolam  (XANAX ) 0.5 MG tablet Take 0.5 mg by mouth 3 (three) times daily.   aspirin  81 MG tablet Take 81 mg by mouth daily.   atorvastatin  (LIPITOR) 80 MG tablet Take 1 tablet (80 mg total) by mouth daily.   azithromycin  (ZITHROMAX ) 500 MG tablet Take 1 tablet (500 mg total) by mouth daily for 3 days.   bisoprolol  (ZEBETA ) 5 MG tablet TAKE 1/2 TABLET BY MOUTH DAILY   budesonide  (PULMICORT ) 0.5 MG/2ML nebulizer solution Take 2  mLs (0.5 mg total) by nebulization in the morning and at bedtime.   dapagliflozin  propanediol (FARXIGA ) 10 MG TABS tablet TAKE 1 TABLET BY MOUTH DAILY BEFORE BREAKFAST.   Ensifentrine  (OHTUVAYRE ) 3 MG/2.5ML SUSP Inhale 3 mLs into the lungs 2 (two) times daily. DX:J44.9   ezetimibe  (ZETIA ) 10 MG tablet Take 1 tablet (10 mg total) by mouth daily.   famotidine  (PEPCID ) 20 MG tablet TAKE 1 TABLET (20 MG TOTAL) BY MOUTH 2 (TWO) TIMES DAILY. FOR BREAKTHROUGH REFLUX   FLUoxetine  (PROZAC ) 20 MG tablet Take 20 mg by mouth daily.   fluticasone  (FLONASE ) 50 MCG/ACT nasal spray Place 2 sprays into both nostrils daily.   furosemide  (LASIX ) 20 MG tablet TAKE 1 TABLET BY MOUTH AS NEEDED   ipratropium-albuterol  (DUONEB) 0.5-2.5 (3) MG/3ML SOLN Take 3 mLs by nebulization every 6 (six) hours as needed.   ketoconazole  (NIZORAL ) 2 % cream Apply 1 Application topically daily.   loratadine  (CLARITIN ) 10 MG tablet Take 10 mg by mouth daily.   methylPREDNISolone  (MEDROL  DOSEPAK) 4 MG TBPK tablet Take as directed in the package,this is a taper pack.   mirtazapine  (REMERON ) 45 MG tablet Take 45 mg by mouth at bedtime.   Multiple Vitamin (MULTIVITAMIN) capsule Take 1 capsule by mouth daily.   nitroGLYCERIN  (NITROSTAT ) 0.4 MG SL tablet Place 1 tablet (0.4 mg total) under the tongue every 5 (five) minutes as needed.   OXYGEN  Inhale 3 L into the lungs daily.   pantoprazole  (PROTONIX ) 40 MG tablet Take 40 mg by mouth 2 (two) times daily before a meal.   sacubitril -valsartan  (ENTRESTO )  49-51 MG Take 1 tablet by mouth 2 (two) times daily.   Spacer/Aero-Holding Chambers (AEROCHAMBER MV) inhaler Use as instructed   spironolactone  (ALDACTONE ) 25 MG tablet Take 0.5 tablets (12.5 mg total) by mouth daily.   sucralfate  (CARAFATE ) 1 g tablet Take 1 tablet (1 g total) by mouth 3 (three) times daily with meals. 1 hour before meals and medications   [DISCONTINUED] azithromycin  (ZITHROMAX ) 250 MG tablet Take 1 tablet (250 mg total) by  mouth 3 (three) times a week. M-W-F    Immunization History  Administered Date(s) Administered    sv, Bivalent, Protein Subunit Rsvpref,pf (Abrysvo) 02/20/2023   Fluad Quad(high Dose 65+) 11/16/2021, 11/05/2022   INFLUENZA, HIGH DOSE SEASONAL PF 09/28/2019, 10/21/2023   Influenza Whole 09/19/2012   Influenza,inj,Quad PF,6+ Mos 10/24/2013, 11/25/2014, 03/02/2016, 10/27/2016, 10/05/2017   Influenza-Unspecified 10/24/2013, 11/25/2014, 03/02/2016, 10/27/2016, 10/05/2017, 09/18/2018, 09/28/2019, 09/30/2020   Moderna Covid-19 Fall Seasonal Vaccine 47yrs & older 11/05/2022, 03/29/2023, 10/21/2023, 03/30/2024   Moderna Covid-19 Seasonal Vaccine 6 months thru 71years of age 10/05/2022   Moderna Sars-Covid-2 Vaccination 01/26/2020, 02/23/2020   PFIZER Comirnaty(Gray Top)Covid-19 Tri-Sucrose Vaccine 12/15/2020, 07/15/2021   PFIZER(Purple Top)SARS-COV-2 Vaccination 01/26/2020, 02/23/2020   PNEUMOCOCCAL CONJUGATE-20 03/22/2022   Pfizer Covid-19 Vaccine Bivalent Booster 26yrs & up 01/14/2022, 05/25/2022   Pneumococcal Conjugate-13 11/25/2014   Pneumococcal Polysaccharide-23 08/24/2016, 10/27/2016   Pneumococcal-Unspecified 12/20/2008   Tdap 12/20/2008        Objective:     BP 108/68   Pulse 72   Temp 97.7 F (36.5 C) (Temporal)   Ht 5' 10 (1.778 m)   Wt 180 lb 3.2 oz (81.7 kg)   SpO2 92% Comment: 3L oxygen  (POC)  BMI 25.86 kg/m   SpO2: 92 % (3L oxygen  (POC))  GENERAL: Well-developed well-nourished gentleman, chronic use of accessories, no conversational dyspnea. No acute distress.  Presents in transport chair due to dyspnea. Using oxygen  via portable concentrator at 4 L pulsed. HEAD: Normocephalic, atraumatic. EYES: Pupils equal, round, reactive to light.  No scleral icterus. MOUTH: Wears dentures. NECK: Supple. No thyromegaly. No nodules. No JVD.  Trachea midline, no crepitus. PULMONARY: Very distant breath sounds, worse on left base, air movement poor, coarse breath sounds.   Scattered wheezes noted.   CARDIOVASCULAR: S1 and S2. Regular rate and rhythm.  No rubs murmurs or gallops heard. GASTROINTESTINAL: Protuberant abdomen, benign otherwise. MUSCULOSKELETAL: No joint deformity, no clubbing, 1+ pitting edema feet/ankles. NEUROLOGIC: Awake, alert, oriented.  No overt focal deficits.  Speech is fluent. SKIN: Intact,warm,dry.  Limited exam: No rashes. PSYCH: Normal mood and behavior.   Lab Results  Component Value Date   NITRICOXIDE 30 09/13/2024  *Nitric oxide  is noted to be at intermediate level indicating type II inflammation present.  Clinical correlation needed.        Assessment & Plan:     ICD-10-CM   1. Stage 4 very severe COPD by GOLD classification (HCC)  J44.9     2. Acute exacerbation of COPD with asthma (HCC)  J44.1 Nitric oxide     3. Bronchiectasis without complication (HCC)  J47.9     4. Former smoker  Z87.891       Orders Placed This Encounter  Procedures   Nitric oxide     Meds ordered this encounter  Medications   methylPREDNISolone  (MEDROL  DOSEPAK) 4 MG TBPK tablet    Sig: Take as directed in the package,this is a taper pack.    Dispense:  21 tablet    Refill:  0   azithromycin  (ZITHROMAX ) 500 MG tablet  Sig: Take 1 tablet (500 mg total) by mouth daily for 3 days.    Dispense:  3 tablet    Refill:  0   Discussion:    Chronic obstructive pulmonary disease (COPD) with acute lower respiratory tract infection and asthma overlap Reports difficulty breathing and wheezing. Sputum is light green, indicating possible infection. Inflammation in the airways is elevated, suggesting asthma involvement. Recently started therapy with a vest to help clear secretions. - Start Mucinex  twice a day with plenty of water to loosen mucus. - Initiate Nucala  injection for COPD and asthma overlap. First injection to be administered in [place] for training purposes. - Prescribe a Medrol  taper due to wheezing. - Prescribe an antibiotic  (azithromycin ) for a few days to address the infection. - Continue using the vest therapy to help clear secretions. - Provide instructions for using Mullen tea and ginger tea as natural remedies.     Follow-up will be in 6 weeks time call sooner should any problems arise.  Advised if symptoms do not improve or worsen, to please contact office for sooner follow up or seek emergency care.    I spent 40 minutes of dedicated to the care of this patient on the date of this encounter to include pre-visit review of records, face-to-face time with the patient discussing conditions above, post visit ordering of testing, clinical documentation with the electronic health record, making appropriate referrals as documented, and communicating necessary findings to members of the patients care team.     C. Leita Sanders, MD Advanced Bronchoscopy PCCM [facility]    *This note was generated using voice recognition software/Dragon and/or AI transcription program.  Despite best efforts to proofread, errors can occur which can change the meaning. Any transcriptional errors that result from this process are unintentional and may not be fully corrected at the time of dictation.

## 2024-09-13 NOTE — Telephone Encounter (Signed)
 Patient seen in office today, by Dr. Tamea. Patient will be started on Nucala . Forms have been signed, and will be faxed to pharmacy team.

## 2024-09-13 NOTE — Patient Instructions (Addendum)
 VISIT SUMMARY:  Leslie Sims, a 73 year old male with COPD, came in due to difficulty breathing and mucus retention. He has been experiencing wheezing and light green sputum, indicating a possible infection. He recently started using a vest machine to help clear secretions but has not been able to use it consistently. He is considering using Mullen tea and ginger tea as natural remedies.  YOUR PLAN:  -CHRONIC OBSTRUCTIVE PULMONARY DISEASE (COPD) WITH ACUTE LOWER RESPIRATORY TRACT INFECTION AND ASTHMA OVERLAP: COPD is a chronic lung disease that makes it hard to breathe, and it can be complicated by infections and asthma. You will start taking Mucinex  twice a day with plenty of water to help loosen mucus. You will also begin Nucala  injections to manage both COPD and asthma, with the first injection to be administered in [place] for training. A prednisone  taper has been prescribed to reduce wheezing, and you will take an antibiotic for a few days to address the infection. Continue using the vest therapy to help clear secretions. Additionally, you can use Mullen tea and ginger tea as natural remedies to help with mucus loosening.  INSTRUCTIONS:  Please follow up for your first Nucala  injection in [place]. Continue taking your medications as prescribed and use the vest therapy regularly. If symptoms persist or worsen, contact our office.

## 2024-09-14 ENCOUNTER — Other Ambulatory Visit (HOSPITAL_COMMUNITY): Payer: Self-pay

## 2024-09-14 ENCOUNTER — Telehealth: Payer: Self-pay

## 2024-09-14 DIAGNOSIS — J449 Chronic obstructive pulmonary disease, unspecified: Secondary | ICD-10-CM

## 2024-09-14 MED ORDER — NUCALA 100 MG/ML ~~LOC~~ SOAJ
100.0000 mg | SUBCUTANEOUS | 0 refills | Status: DC
  Filled 2024-09-19: qty 1, 28d supply, fill #0

## 2024-09-14 NOTE — Telephone Encounter (Signed)
 Spoke to patient - scheduled for new start Nucala  on Monday, 09/24/24 at 2:00 PM.   He is aware that location differs from Dr. Evalyn office. Provided address: [address].   He is aware he will receive call from [place] for onboarding.   Aleck Puls, PharmD, BCPS Clinical Pharmacist  [facility]

## 2024-09-14 NOTE — Telephone Encounter (Signed)
 Received notification that pt is Nucala  new start. Submitted a Prior Authorization request to [facility] for NUCALA  via CoverMyMeds. Will update once we receive a response.  Key: [id number]

## 2024-09-14 NOTE — Addendum Note (Signed)
 Addended by: Kearra Calkin L on: 09/14/2024 02:09 PM   Modules accepted: Orders

## 2024-09-14 NOTE — Telephone Encounter (Signed)
 Received notification from [facility] regarding a prior authorization for NUCALA . Authorization has been APPROVED from 12/21/23 to 09/14/25. Approval letter sent to scan center.  Per test claim, copay for 28 days supply is $0  Patient can fill through [facility]: [phone]   Authorization # [id number] Phone # [phone]

## 2024-09-16 ENCOUNTER — Other Ambulatory Visit: Payer: Self-pay

## 2024-09-16 ENCOUNTER — Emergency Department

## 2024-09-16 ENCOUNTER — Inpatient Hospital Stay
Admission: EM | Admit: 2024-09-16 | Discharge: 2024-09-18 | DRG: 191 | Disposition: A | Discharge status: Home / Self Care | Attending: Internal Medicine | Admitting: Internal Medicine

## 2024-09-16 DIAGNOSIS — Z87891 Personal history of nicotine dependence: Secondary | ICD-10-CM

## 2024-09-16 DIAGNOSIS — E119 Type 2 diabetes mellitus without complications: Secondary | ICD-10-CM | POA: Diagnosis present

## 2024-09-16 DIAGNOSIS — Z888 Allergy status to other drugs, medicaments and biological substances status: Secondary | ICD-10-CM

## 2024-09-16 DIAGNOSIS — Z1152 Encounter for screening for COVID-19: Secondary | ICD-10-CM

## 2024-09-16 DIAGNOSIS — Z8049 Family history of malignant neoplasm of other genital organs: Secondary | ICD-10-CM

## 2024-09-16 DIAGNOSIS — Z860100 Personal history of colon polyps, unspecified: Secondary | ICD-10-CM

## 2024-09-16 DIAGNOSIS — Z955 Presence of coronary angioplasty implant and graft: Secondary | ICD-10-CM

## 2024-09-16 DIAGNOSIS — J441 Chronic obstructive pulmonary disease with (acute) exacerbation: Principal | ICD-10-CM | POA: Diagnosis present

## 2024-09-16 DIAGNOSIS — Z794 Long term (current) use of insulin: Secondary | ICD-10-CM

## 2024-09-16 DIAGNOSIS — I2510 Atherosclerotic heart disease of native coronary artery without angina pectoris: Secondary | ICD-10-CM | POA: Diagnosis present

## 2024-09-16 DIAGNOSIS — J9611 Chronic respiratory failure with hypoxia: Secondary | ICD-10-CM | POA: Diagnosis present

## 2024-09-16 DIAGNOSIS — E785 Hyperlipidemia, unspecified: Secondary | ICD-10-CM | POA: Diagnosis present

## 2024-09-16 DIAGNOSIS — Z7951 Long term (current) use of inhaled steroids: Secondary | ICD-10-CM

## 2024-09-16 DIAGNOSIS — K219 Gastro-esophageal reflux disease without esophagitis: Secondary | ICD-10-CM | POA: Diagnosis present

## 2024-09-16 DIAGNOSIS — C159 Malignant neoplasm of esophagus, unspecified: Secondary | ICD-10-CM | POA: Diagnosis present

## 2024-09-16 DIAGNOSIS — I5020 Unspecified systolic (congestive) heart failure: Secondary | ICD-10-CM

## 2024-09-16 DIAGNOSIS — I5032 Chronic diastolic (congestive) heart failure: Principal | ICD-10-CM

## 2024-09-16 DIAGNOSIS — Z7982 Long term (current) use of aspirin: Secondary | ICD-10-CM

## 2024-09-16 DIAGNOSIS — Z9981 Dependence on supplemental oxygen: Secondary | ICD-10-CM

## 2024-09-16 DIAGNOSIS — F32A Depression, unspecified: Secondary | ICD-10-CM | POA: Diagnosis present

## 2024-09-16 DIAGNOSIS — M7989 Other specified soft tissue disorders: Secondary | ICD-10-CM | POA: Diagnosis present

## 2024-09-16 DIAGNOSIS — I5022 Chronic systolic (congestive) heart failure: Secondary | ICD-10-CM | POA: Diagnosis present

## 2024-09-16 DIAGNOSIS — Z79899 Other long term (current) drug therapy: Secondary | ICD-10-CM

## 2024-09-16 DIAGNOSIS — Z8249 Family history of ischemic heart disease and other diseases of the circulatory system: Secondary | ICD-10-CM

## 2024-09-16 DIAGNOSIS — Z833 Family history of diabetes mellitus: Secondary | ICD-10-CM

## 2024-09-16 DIAGNOSIS — Z8546 Personal history of malignant neoplasm of prostate: Secondary | ICD-10-CM

## 2024-09-16 DIAGNOSIS — C61 Malignant neoplasm of prostate: Secondary | ICD-10-CM | POA: Diagnosis present

## 2024-09-16 DIAGNOSIS — F419 Anxiety disorder, unspecified: Secondary | ICD-10-CM | POA: Diagnosis present

## 2024-09-16 DIAGNOSIS — Z8711 Personal history of peptic ulcer disease: Secondary | ICD-10-CM

## 2024-09-16 DIAGNOSIS — I110 Hypertensive heart disease with heart failure: Secondary | ICD-10-CM | POA: Diagnosis present

## 2024-09-16 LAB — CBC
HCT: 41.3 % (ref 39.0–52.0)
Hemoglobin: 12.8 g/dL — ABNORMAL LOW (ref 13.0–17.0)
MCH: 29.1 pg (ref 26.0–34.0)
MCHC: 31 g/dL (ref 30.0–36.0)
MCV: 93.9 fL (ref 80.0–100.0)
Platelets: 257 K/uL (ref 150–400)
RBC: 4.4 MIL/uL (ref 4.22–5.81)
RDW: 14.1 % (ref 11.5–15.5)
WBC: 7.3 K/uL (ref 4.0–10.5)
nRBC: 0 % (ref 0.0–0.2)

## 2024-09-16 LAB — BASIC METABOLIC PANEL WITH GFR
Anion gap: 11 (ref 5–15)
BUN: 20 mg/dL (ref 8–23)
CO2: 32 mmol/L (ref 22–32)
Calcium: 9 mg/dL (ref 8.9–10.3)
Chloride: 97 mmol/L — ABNORMAL LOW (ref 98–111)
Creatinine, Ser: 1.06 mg/dL (ref 0.61–1.24)
GFR, Estimated: >60 mL/min (ref >60)
Glucose, Bld: 137 mg/dL — ABNORMAL HIGH (ref 70–99)
Potassium: 3.9 mmol/L (ref 3.5–5.1)
Sodium: 140 mmol/L (ref 135–145)

## 2024-09-16 LAB — BRAIN NATRIURETIC PEPTIDE: B Natriuretic Peptide: 110.5 pg/mL — ABNORMAL HIGH (ref 0.0–100.0)

## 2024-09-16 LAB — RESP PANEL BY RT-PCR (RSV, FLU A&B, COVID)  RVPGX2
Influenza A by PCR: NEGATIVE
Influenza B by PCR: NEGATIVE
Resp Syncytial Virus by PCR: NEGATIVE
SARS Coronavirus 2 by RT PCR: NEGATIVE

## 2024-09-16 LAB — TROPONIN I (HIGH SENSITIVITY): Troponin I (High Sensitivity): 7 ng/L (ref <18)

## 2024-09-16 MED ORDER — IPRATROPIUM-ALBUTEROL 0.5-2.5 (3) MG/3ML IN SOLN
3.0000 mL | Freq: Once | RESPIRATORY_TRACT | Status: AC
  Administered 2024-09-16: 3 mL via RESPIRATORY_TRACT
  Filled 2024-09-16: qty 3

## 2024-09-16 MED ORDER — METHYLPREDNISOLONE SODIUM SUCC 125 MG IJ SOLR
125.0000 mg | INTRAMUSCULAR | Status: AC
  Administered 2024-09-16: 125 mg via INTRAVENOUS
  Filled 2024-09-16: qty 2

## 2024-09-16 NOTE — ED Triage Notes (Signed)
 Pt arrives via [facility] from home for difficulty breathing x3 days. Hx of COPD and on 3L of O2 at home. Pt audibly wheezing during triage  EMS vitals: 96-98% on 3L 80 pulse 126/50 BP

## 2024-09-17 ENCOUNTER — Other Ambulatory Visit: Payer: Self-pay

## 2024-09-17 DIAGNOSIS — I5020 Unspecified systolic (congestive) heart failure: Secondary | ICD-10-CM

## 2024-09-17 DIAGNOSIS — Z860100 Personal history of colon polyps, unspecified: Secondary | ICD-10-CM | POA: Diagnosis not present

## 2024-09-17 DIAGNOSIS — Z833 Family history of diabetes mellitus: Secondary | ICD-10-CM | POA: Diagnosis not present

## 2024-09-17 DIAGNOSIS — J441 Chronic obstructive pulmonary disease with (acute) exacerbation: Secondary | ICD-10-CM | POA: Diagnosis present

## 2024-09-17 DIAGNOSIS — J9611 Chronic respiratory failure with hypoxia: Secondary | ICD-10-CM | POA: Diagnosis present

## 2024-09-17 DIAGNOSIS — Z1152 Encounter for screening for COVID-19: Secondary | ICD-10-CM | POA: Diagnosis not present

## 2024-09-17 DIAGNOSIS — F419 Anxiety disorder, unspecified: Secondary | ICD-10-CM

## 2024-09-17 DIAGNOSIS — Z8711 Personal history of peptic ulcer disease: Secondary | ICD-10-CM | POA: Diagnosis not present

## 2024-09-17 DIAGNOSIS — M7989 Other specified soft tissue disorders: Secondary | ICD-10-CM | POA: Diagnosis present

## 2024-09-17 DIAGNOSIS — C159 Malignant neoplasm of esophagus, unspecified: Secondary | ICD-10-CM | POA: Diagnosis present

## 2024-09-17 DIAGNOSIS — I5022 Chronic systolic (congestive) heart failure: Secondary | ICD-10-CM | POA: Diagnosis present

## 2024-09-17 DIAGNOSIS — I2510 Atherosclerotic heart disease of native coronary artery without angina pectoris: Secondary | ICD-10-CM | POA: Diagnosis present

## 2024-09-17 DIAGNOSIS — Z8249 Family history of ischemic heart disease and other diseases of the circulatory system: Secondary | ICD-10-CM | POA: Diagnosis not present

## 2024-09-17 DIAGNOSIS — Z87891 Personal history of nicotine dependence: Secondary | ICD-10-CM | POA: Diagnosis not present

## 2024-09-17 DIAGNOSIS — E785 Hyperlipidemia, unspecified: Secondary | ICD-10-CM | POA: Diagnosis present

## 2024-09-17 DIAGNOSIS — F32A Depression, unspecified: Secondary | ICD-10-CM | POA: Diagnosis present

## 2024-09-17 DIAGNOSIS — Z7982 Long term (current) use of aspirin: Secondary | ICD-10-CM | POA: Diagnosis not present

## 2024-09-17 DIAGNOSIS — C61 Malignant neoplasm of prostate: Secondary | ICD-10-CM | POA: Diagnosis present

## 2024-09-17 DIAGNOSIS — Z79899 Other long term (current) drug therapy: Secondary | ICD-10-CM | POA: Diagnosis not present

## 2024-09-17 DIAGNOSIS — Z794 Long term (current) use of insulin: Secondary | ICD-10-CM | POA: Diagnosis not present

## 2024-09-17 DIAGNOSIS — K219 Gastro-esophageal reflux disease without esophagitis: Secondary | ICD-10-CM | POA: Diagnosis present

## 2024-09-17 DIAGNOSIS — I110 Hypertensive heart disease with heart failure: Secondary | ICD-10-CM | POA: Diagnosis present

## 2024-09-17 DIAGNOSIS — E119 Type 2 diabetes mellitus without complications: Secondary | ICD-10-CM

## 2024-09-17 DIAGNOSIS — Z8546 Personal history of malignant neoplasm of prostate: Secondary | ICD-10-CM | POA: Diagnosis not present

## 2024-09-17 DIAGNOSIS — Z955 Presence of coronary angioplasty implant and graft: Secondary | ICD-10-CM | POA: Diagnosis not present

## 2024-09-17 LAB — CBG MONITORING, ED
Glucose-Capillary: 245 mg/dL — ABNORMAL HIGH (ref 70–99)
Glucose-Capillary: 124 mg/dL — ABNORMAL HIGH (ref 70–99)
Glucose-Capillary: 127 mg/dL — ABNORMAL HIGH (ref 70–99)

## 2024-09-17 LAB — GLUCOSE, CAPILLARY
Glucose-Capillary: 126 mg/dL — ABNORMAL HIGH (ref 70–99)
Glucose-Capillary: 85 mg/dL (ref 70–99)

## 2024-09-17 MED ORDER — ENSIFENTRINE 3 MG/2.5ML IN SUSP: 3.0000 mL | Freq: Two times a day (BID) | RESPIRATORY_TRACT | Status: DC

## 2024-09-17 MED ORDER — METHYLPREDNISOLONE SODIUM SUCC 40 MG IJ SOLR
40.0000 mg | Freq: Two times a day (BID) | INTRAMUSCULAR | Status: AC
  Administered 2024-09-17 (×2): 40 mg via INTRAVENOUS
  Filled 2024-09-17 (×2): qty 1

## 2024-09-17 MED ORDER — DAPAGLIFLOZIN PROPANEDIOL 10 MG PO TABS
10.0000 mg | ORAL_TABLET | Freq: Every day | ORAL | Status: DC
  Administered 2024-09-17 – 2024-09-18 (×2): 10 mg via ORAL
  Filled 2024-09-17 (×2): qty 1

## 2024-09-17 MED ORDER — INSULIN ASPART 100 UNIT/ML IJ SOLN
0.0000 [IU] | Freq: Every day | INTRAMUSCULAR | Status: DC
  Administered 2024-09-17: 2 [IU] via SUBCUTANEOUS
  Filled 2024-09-17: qty 1

## 2024-09-17 MED ORDER — BUDESONIDE 0.5 MG/2ML IN SUSP
0.5000 mg | Freq: Two times a day (BID) | RESPIRATORY_TRACT | Status: DC
  Administered 2024-09-17 – 2024-09-18 (×4): 0.5 mg via RESPIRATORY_TRACT
  Filled 2024-09-17 (×4): qty 2

## 2024-09-17 MED ORDER — HYDROCODONE-ACETAMINOPHEN 5-325 MG PO TABS: 1.0000 | ORAL_TABLET | ORAL | Status: DC | PRN

## 2024-09-17 MED ORDER — INSULIN ASPART 100 UNIT/ML IJ SOLN
0.0000 [IU] | Freq: Three times a day (TID) | INTRAMUSCULAR | Status: DC
  Administered 2024-09-17 – 2024-09-18 (×4): 2 [IU] via SUBCUTANEOUS
  Filled 2024-09-17 (×2): qty 1
  Filled 2024-09-17: qty 2
  Filled 2024-09-17: qty 1

## 2024-09-17 MED ORDER — ATORVASTATIN CALCIUM 80 MG PO TABS
80.0000 mg | ORAL_TABLET | Freq: Every day | ORAL | Status: DC
  Administered 2024-09-17 – 2024-09-18 (×2): 80 mg via ORAL
  Filled 2024-09-17: qty 1
  Filled 2024-09-17: qty 4

## 2024-09-17 MED ORDER — FUROSEMIDE 40 MG PO TABS: 20.0000 mg | ORAL_TABLET | ORAL | Status: DC | PRN

## 2024-09-17 MED ORDER — FUROSEMIDE 10 MG/ML IJ SOLN
20.0000 mg | Freq: Two times a day (BID) | INTRAMUSCULAR | Status: DC
  Administered 2024-09-17 – 2024-09-18 (×3): 20 mg via INTRAVENOUS
  Filled 2024-09-17 (×3): qty 4

## 2024-09-17 MED ORDER — MIRTAZAPINE 15 MG PO TABS
45.0000 mg | ORAL_TABLET | Freq: Every day | ORAL | Status: DC
  Administered 2024-09-17 (×2): 45 mg via ORAL
  Filled 2024-09-17 (×2): qty 3

## 2024-09-17 MED ORDER — ALBUTEROL SULFATE (2.5 MG/3ML) 0.083% IN NEBU: 2.5000 mg | INHALATION_SOLUTION | RESPIRATORY_TRACT | Status: DC | PRN

## 2024-09-17 MED ORDER — IPRATROPIUM-ALBUTEROL 0.5-2.5 (3) MG/3ML IN SOLN
3.0000 mL | Freq: Four times a day (QID) | RESPIRATORY_TRACT | Status: DC
  Administered 2024-09-17 – 2024-09-18 (×6): 3 mL via RESPIRATORY_TRACT
  Filled 2024-09-17 (×6): qty 3

## 2024-09-17 MED ORDER — ASPIRIN 81 MG PO TBEC
81.0000 mg | DELAYED_RELEASE_TABLET | Freq: Every day | ORAL | Status: DC
  Administered 2024-09-17 – 2024-09-18 (×2): 81 mg via ORAL
  Filled 2024-09-17 (×2): qty 1

## 2024-09-17 MED ORDER — PREDNISONE 20 MG PO TABS
40.0000 mg | ORAL_TABLET | Freq: Every day | ORAL | Status: DC
  Administered 2024-09-18: 40 mg via ORAL
  Filled 2024-09-17: qty 2

## 2024-09-17 MED ORDER — EZETIMIBE 10 MG PO TABS
10.0000 mg | ORAL_TABLET | Freq: Every day | ORAL | Status: DC
  Administered 2024-09-17 – 2024-09-18 (×2): 10 mg via ORAL
  Filled 2024-09-17 (×2): qty 1

## 2024-09-17 MED ORDER — SACUBITRIL-VALSARTAN 49-51 MG PO TABS
1.0000 | ORAL_TABLET | Freq: Two times a day (BID) | ORAL | Status: DC
  Administered 2024-09-17 – 2024-09-18 (×3): 1 via ORAL
  Filled 2024-09-17 (×3): qty 1

## 2024-09-17 MED ORDER — ONDANSETRON HCL 4 MG PO TABS: 4.0000 mg | ORAL_TABLET | Freq: Four times a day (QID) | ORAL | Status: DC | PRN

## 2024-09-17 MED ORDER — SPIRONOLACTONE 12.5 MG HALF TABLET
12.5000 mg | ORAL_TABLET | Freq: Every day | ORAL | Status: DC
  Administered 2024-09-17 – 2024-09-18 (×2): 12.5 mg via ORAL
  Filled 2024-09-17 (×2): qty 1

## 2024-09-17 MED ORDER — MEPOLIZUMAB 100 MG/ML ~~LOC~~ SOAJ
100.0000 mg | SUBCUTANEOUS | Status: DC
Start: 2024-09-17 — End: 2024-09-17

## 2024-09-17 MED ORDER — BISOPROLOL FUMARATE 5 MG PO TABS
2.5000 mg | ORAL_TABLET | Freq: Every day | ORAL | Status: DC
  Administered 2024-09-17 – 2024-09-18 (×2): 2.5 mg via ORAL
  Filled 2024-09-17 (×2): qty 0.5

## 2024-09-17 MED ORDER — NITROGLYCERIN 0.4 MG SL SUBL: 0.4000 mg | SUBLINGUAL_TABLET | SUBLINGUAL | Status: DC | PRN

## 2024-09-17 MED ORDER — FUROSEMIDE 10 MG/ML IJ SOLN: 20.0000 mg | Freq: Two times a day (BID) | INTRAMUSCULAR | Status: DC

## 2024-09-17 MED ORDER — ACETAMINOPHEN 325 MG PO TABS: 650.0000 mg | ORAL_TABLET | Freq: Four times a day (QID) | ORAL | Status: DC | PRN

## 2024-09-17 MED ORDER — ENOXAPARIN SODIUM 40 MG/0.4ML IJ SOSY
40.0000 mg | PREFILLED_SYRINGE | INTRAMUSCULAR | Status: DC
  Administered 2024-09-17 – 2024-09-18 (×2): 40 mg via SUBCUTANEOUS
  Filled 2024-09-17 (×2): qty 0.4

## 2024-09-17 MED ORDER — ACETAMINOPHEN 650 MG RE SUPP: 650.0000 mg | Freq: Four times a day (QID) | RECTAL | Status: DC | PRN

## 2024-09-17 MED ORDER — ALPRAZOLAM 0.5 MG PO TABS
0.5000 mg | ORAL_TABLET | Freq: Three times a day (TID) | ORAL | Status: DC
  Administered 2024-09-17 – 2024-09-18 (×4): 0.5 mg via ORAL
  Filled 2024-09-17 (×4): qty 1

## 2024-09-17 MED ORDER — ONDANSETRON HCL 4 MG/2ML IJ SOLN: 4.0000 mg | Freq: Four times a day (QID) | INTRAMUSCULAR | Status: DC | PRN

## 2024-09-17 MED ORDER — FLUOXETINE HCL 20 MG PO CAPS
20.0000 mg | ORAL_CAPSULE | Freq: Every day | ORAL | Status: DC
  Administered 2024-09-17 – 2024-09-18 (×2): 20 mg via ORAL
  Filled 2024-09-17 (×2): qty 1

## 2024-09-17 NOTE — ED Provider Notes (Signed)
 [facility] Provider Note    Event Date/Time   First MD Initiated Contact with Patient 09/16/24 2124     (approximate)   History   Shortness of Breath   HPI  Leslie Sims is a 73 y.o. male history of severe COPD also congestive heart failure  Patient reports been having COPD wheezing for few days.  Saw Dr. Tamea and was given steroids and azithromycin  but has not seen improvement, continues to have dyspnea and wheezing.  Also has some swelling in both lower feet.  No fevers no chills no chest pain.  He is wheezing.  Uses 3 L of oxygen  at baseline.     Physical Exam   Triage Vital Signs: ED Triage Vitals  Encounter Vitals Group     BP 09/16/24 2121 118/67     Girls Systolic BP Percentile --      Girls Diastolic BP Percentile --      Boys Systolic BP Percentile --      Boys Diastolic BP Percentile --      Pulse Rate 09/16/24 2121 64     Resp 09/16/24 2121 20     Temp 09/16/24 2121 98.4 F (36.9 C)     Temp Source 09/16/24 2121 Oral     SpO2 09/16/24 2121 98 %     Weight 09/16/24 2116 179 lb 6.4 oz (81.4 kg)     Height 09/16/24 2116 5' 10 (1.778 m)     Head Circumference --      Peak Flow --      Pain Score 09/16/24 2116 2     Pain Loc --      Pain Education --      Exclude from Growth Chart --     Most recent vital signs: Vitals:   09/16/24 2230 09/16/24 2300  BP: 134/71 126/87  Pulse: 72 (!) 59  Resp: 19 17  Temp:    SpO2: 98% 98%     General: Awake, barrel chested, not hypoxic on 3 L nasal cannula but mild to moderate accessory muscle use and increased expiratory phase.  Slight pursed lip breathing CV:  Good peripheral perfusion.  Normal rate Resp:  Mild to moderate increased work of breathing, speaking in phrases, with diffuse expiratory wheezing throughout.  No crackles. Abd:  No distention.  Other:  Mild bilateral lower extremity edema   ED Results / Procedures / Treatments   Labs (all labs ordered are  listed, but only abnormal results are displayed) Labs Reviewed  BASIC METABOLIC PANEL WITH GFR - Abnormal; Notable for the following components:      Result Value   Chloride 97 (*)    Glucose, Bld 137 (*)    All other components within normal limits  CBC - Abnormal; Notable for the following components:   Hemoglobin 12.8 (*)    All other components within normal limits  BRAIN NATRIURETIC PEPTIDE - Abnormal; Notable for the following components:   B Natriuretic Peptide 110.5 (*)    All other components within normal limits  RESP PANEL BY RT-PCR (RSV, FLU A&B, COVID)  [id number]  TROPONIN I (HIGH SENSITIVITY)     EKG  Inter by me at 2135 heart rate 60 QRS 120 QTc 420 Sinus rhythm, mild nonspecific T wave abnormality.  No frank evidence   RADIOLOGY  Chest x-ray inter by me is negative for acute infiltrate  DG Chest Port 1 View Result Date: 09/16/2024 CLINICAL DATA:  [id number] SOB (shortness of breath)  [id number] Pt arrives via [facility] from home for difficulty breathing x3 days. Hx of COPD and on 3L of O2 at home. Pt audibly wheezing during triage EMS vitals: 96-98% on 3L 80 pulse 126/50 BP CHANGE PER DR Norleen Xie PATIENT CONDITION EXAM: PORTABLE CHEST 1 VIEW COMPARISON:  Chest x-ray 08/07/2024 FINDINGS: The heart and mediastinal contours are unchanged. Atherosclerotic plaque. No focal consolidation. No pulmonary edema. No pleural effusion. No pneumothorax. No acute osseous abnormality. IMPRESSION: 1. No active disease. 2.  Aortic Atherosclerosis (ICD10-I70.0). Electronically Signed   By: Morgane  Naveau M.D.   On: 09/16/2024 22:43      PROCEDURES:  Critical Care performed: Yes, see critical care procedure note(s)  Procedures CRITICAL CARE Performed by: Oneil Budge   Total critical care time: 25 minutes  Critical care time was exclusive of separately billable procedures and treating other patients.  Critical care was necessary to treat or prevent imminent or life-threatening  deterioration.  Critical care was time spent personally by me on the following activities: development of treatment plan with patient and/or surrogate as well as nursing, discussions with consultants, evaluation of patient's response to treatment, examination of patient, obtaining history from patient or surrogate, ordering and performing treatments and interventions, ordering and review of laboratory studies, ordering and review of radiographic studies, pulse oximetry and re-evaluation of patient's condition.   MEDICATIONS ORDERED IN ED: Medications  ipratropium-albuterol  (DUONEB) 0.5-2.5 (3) MG/3ML nebulizer solution 3 mL (3 mLs Nebulization Given 09/16/24 2204)  ipratropium-albuterol  (DUONEB) 0.5-2.5 (3) MG/3ML nebulizer solution 3 mL (3 mLs Nebulization Given 09/16/24 2204)  methylPREDNISolone  sodium succinate (SOLU-MEDROL ) 125 mg/2 mL injection 125 mg (125 mg Intravenous Given 09/16/24 2201)  ipratropium-albuterol  (DUONEB) 0.5-2.5 (3) MG/3ML nebulizer solution 3 mL (3 mLs Nebulization Given 09/16/24 2317)     IMPRESSION / MDM / ASSESSMENT AND PLAN / ED COURSE  I reviewed the triage vital signs and the nursing notes.                              Differential diagnosis includes, but is not limited to, COPD exacerbation volume overload pneumonia, pneumothorax, far less likely cause such as ACS.  History of same history of severe COPD.  Minimal improvement after 3 DuoNebs but patient does report feeling more comfortable still with wheezing.  Steroid given.  Recent antibiotic no fever no elevated white count.  Plan to admit for further workup.  No chest pain, no pleuritic symptoms no tachycardia, given his previous history examination with diffuse wheezing I think thromboembolism would be of low concern at this time.  Further care treatment under the hospitalist service excepted Dr. Cleatus.  Patient agreeable  Patient's presentation is most consistent with acute presentation with potential threat to  life or bodily function.   The patient is on the cardiac monitor to evaluate for evidence of arrhythmia and/or significant heart rate changes.       FINAL CLINICAL IMPRESSION(S) / ED DIAGNOSES   Final diagnoses:  COPD exacerbation (HCC)     Rx / DC Orders   ED Discharge Orders     None        Note:  This document was prepared using Dragon voice recognition software and may include unintentional dictation errors.   Budge Oneil, MD 09/17/24 [phone]

## 2024-09-17 NOTE — ED Notes (Signed)
 CCMD notified of pt transfer to room 31.

## 2024-09-17 NOTE — ED Notes (Signed)
Report received from [facility] RN

## 2024-09-17 NOTE — ED Notes (Signed)
 Pt ambulated with assistance to restroom while on portable O2 @ 3L [place], pt called for assistance getting back to room d/t SOB. Pt assisted with wheelchair, back to treatment room & in bed locked lowest position with call bell in reach.

## 2024-09-17 NOTE — Assessment & Plan Note (Signed)
 Chronic respiratory failure with hypoxia Schedule and as needed nebulized bronchodilators IV steroids Flutter valve, incentive spirometer Mucolytic meds Continue supplemental oxygen 

## 2024-09-17 NOTE — H&P (Signed)
 History and Physical    Patient: Leslie Sims [facility] FMW:[id number] DOB: 04/23/1951 DOA: 09/16/2024 DOS: the patient was seen and examined on 09/17/2024 PCP: Claudene Rayfield HERO, MD  Patient coming from: Home  Chief Complaint:  Chief Complaint  Patient presents with   Shortness of Breath    HPI: Jianni Batten is a 73 y.o. 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  He saw his pulmonologist on 9/25 who started him on Z pak and a prednisone  dose pak but symptoms persisted prompting the visit to the ED.  Of note, patient also noted some swelling of his feet that started 3 days prior.  He said his diuretic medication was changed some months ago by his cardiologist but was told to go back to his old diuretics if he had swelling and he started doing so when he noted the swelling 3 days ago.  He denies abdominal distention and denies chest pain and denies leg pain.  At baseline he sleeps in his recliner.  No worsening orthopnea from baseline In the ED his vitals were within normal limits and labs including CBC, BMP, troponin were all unremarkable as well as respiratory viral panel.  BNP slightly elevated at 110. EKG showing sinus at 61 and chest x-ray was nonacute After several DuoNebs and methylprednisolone  in the ED he continued to have increased work of breathing. Admission requested.     Review of Systems: As mentioned in the history of present illness. All other systems reviewed and are negative.  Past Medical History:  Diagnosis Date   Allergic rhinitis    Anxiety and depression    Back pain    Cancer (HCC)    prostate   Colon polyps 08/2012   colonoscopy; multiple colon polyps; repeat colonoscopy in one year.   COPD (chronic obstructive pulmonary  disease) (HCC)    a. on home O2.   Coronary artery disease 11/2009   a. late presenting ant MI; b. LHC 100% pLAD s/p PCI/DES, 99% mRCA s/p PCI/DES, EF 35%; c. nuclear stress test 05/13: prior ant/inf infarcts w/o ischemia, EF 43%; d. LHC 02/14: widely patent stents with no other obs dz, EF 40%; e. LHC 11/17: LM nl, mLAD 10%, patent LAD stent, dLAD 20%, p-mRCA 10%, mRCA 40%, patent RCA stent, EF 35-45%    Diabetes (HCC)    Dysphagia    GERD (gastroesophageal reflux disease)    Headache(784.0)    Helicobacter pylori (H. pylori)    Hernia    inguinal   HFrEF (heart failure with reduced ejection fraction) (HCC)    a. 10/2016 LV gram: EF 35-45%; b. 06/2018 Echo: EF 40-45%. c. 01/2021 Echo: EF 40-45%   Hyperlipidemia    Hypertension    Insomnia    Myalgia    Peptic ulcer    Pulmonary nodules    Status post dilation of esophageal narrowing 2000   Thickening of esophagus    Tinea pedis    Wears dentures    partial upper   Past Surgical History:  Procedure Laterality Date    coronary stents     Admission  12/20/2012   COPD exacerbation.  [facility].   CARDIAC CATHETERIZATION     CARDIAC CATHETERIZATION  02/05/2013   [facility]   CARDIAC CATHETERIZATION  09/19/2013   [facility] : patent stents  with no change in anatomy. EF: 40$   CARDIAC CATHETERIZATION Left 11/12/2016   Procedure: Left Heart Cath and Coronary Angiography;  Surgeon: Deatrice DELENA Cage, MD;  Location: [facility] INVASIVE CV LAB;  Service: Cardiovascular;  Laterality: Left;   COLONOSCOPY     COLONOSCOPY WITH PROPOFOL  N/A 05/18/2016   Procedure: COLONOSCOPY WITH PROPOFOL ;  Surgeon: Rogelia Copping, MD;  Location: [facility] ENDOSCOPY;  Service: Endoscopy;  Laterality: N/A;   COLONOSCOPY WITH PROPOFOL  N/A 03/19/2020   Procedure: COLONOSCOPY WITH PROPOFOL ;  Surgeon: Unk Corinn Skiff, MD;  Location: [facility] ENDOSCOPY;  Service: Gastroenterology;  Laterality: N/A;   CORONARY ANGIOPLASTY WITH STENT PLACEMENT  09/19/2009   LAD 3.0 X23 mm Xience DES, RCA: 4.0 X 15  mm Xience DES   ELECTROMAGNETIC NAVIGATION BROCHOSCOPY N/A 10/27/2017   Procedure: ELECTROMAGNETIC NAVIGATION BRONCHOSCOPY;  Surgeon: Isaiah Scrivener, MD;  Location: [facility] ORS;  Service: Cardiopulmonary;  Laterality: N/A;   ESOPHAGEAL DILATION     ESOPHAGOGASTRODUODENOSCOPY  12/20/2006   ESOPHAGOGASTRODUODENOSCOPY  08/20/2012   ESOPHAGOGASTRODUODENOSCOPY (EGD) WITH PROPOFOL  N/A 04/19/2022   Procedure: ESOPHAGOGASTRODUODENOSCOPY (EGD) WITH PROPOFOL ;  Surgeon: Maryruth Ole DASEN, MD;  Location: [facility] ENDOSCOPY;  Service: Endoscopy;  Laterality: N/A;   ESOPHAGOGASTRODUODENOSCOPY (EGD) WITH PROPOFOL  N/A 04/27/2022   Procedure: ESOPHAGOGASTRODUODENOSCOPY (EGD) WITH PROPOFOL ;  Surgeon: Maryruth Ole DASEN, MD;  Location: [facility] ENDOSCOPY;  Service: Endoscopy;  Laterality: N/A;   ESOPHAGOGASTRODUODENOSCOPY (EGD) WITH PROPOFOL  N/A 11/08/2022   Procedure: ESOPHAGOGASTRODUODENOSCOPY (EGD) WITH PROPOFOL ;  Surgeon: Maryruth Ole DASEN, MD;  Location: [facility] ENDOSCOPY;  Service: Endoscopy;  Laterality: N/A;   EUS N/A 05/20/2022   Procedure: UPPER ENDOSCOPIC ULTRASOUND (EUS) LINEAR;  Surgeon: Elta Fonda SQUIBB, MD;  Location: [facility] ENDOSCOPY;  Service: Gastroenterology;  Laterality: N/A;  [facility]   HERNIA REPAIR  07/20/2012   L inguinal hernia repair   PENILE PROSTHESIS IMPLANT     RIGHT/LEFT HEART CATH AND CORONARY ANGIOGRAPHY Bilateral 04/04/2023   Procedure: RIGHT/LEFT HEART CATH AND CORONARY ANGIOGRAPHY;  Surgeon: Cage Deatrice DELENA, MD;  Location: [facility] INVASIVE CV LAB;  Service: Cardiovascular;  Laterality: Bilateral;   Social History:  reports that he quit smoking about 5 years ago. His smoking use included cigarettes. He started smoking about 47 years ago. He has a 126 pack-year smoking history. He has never used smokeless tobacco. He reports that he does not drink alcohol and does not use drugs.  Allergies  Allergen Reactions   Prozac  [Fluoxetine  Hcl] Shortness Of Breath   Hydroxyzine     Bupropion Other (See  Comments)    Makes me feel funny Other reaction(s): Other (See Comments) Unknown/made me feel real funny Makes me feel funny Makes me feel funny Other reaction(s): Other (See Comments) Unknown/made me feel real funny Makes me feel funny   Effexor Xr [Venlafaxine Hcl Er] Other (See Comments)    Makes me feel funny    Family History  Problem Relation Age of Onset   Heart attack Brother        Brother #1   Diabetes Brother    Hypertension Brother        #3   Coronary artery disease Father 29       deceased   Heart attack Father    Diabetes Father    Heart disease Father    COPD Mother 25       deceased   Alcohol abuse Sister        polysubstance abuse   COPD Sister    Lung cancer Sister    Alcohol abuse Sister  polysubstance abuse   Penile cancer Brother    Diabetes Brother    Prostate cancer Neg Hx    Bladder Cancer Neg Hx    Kidney cancer Neg Hx     Prior to Admission medications   Medication Sig Start Date End Date Taking? Authorizing Provider  albuterol  (VENTOLIN  HFA) 108 (90 Base) MCG/ACT inhaler TAKE 2 PUFFS BY MOUTH EVERY 6 HOURS AS NEEDED FOR WHEEZE OR SHORTNESS OF BREATH 07/10/24   Tamea Dedra CROME, MD  ALPRAZolam  (XANAX ) 0.5 MG tablet Take 0.5 mg by mouth 3 (three) times daily. 03/16/23   [provider]  aspirin  81 MG tablet Take 81 mg by mouth daily.    [provider]  atorvastatin  (LIPITOR) 80 MG tablet Take 1 tablet (80 mg total) by mouth daily. 03/18/21   Dunn, Bernardino HERO, PA-C  bisoprolol  (ZEBETA ) 5 MG tablet TAKE 1/2 TABLET BY MOUTH DAILY 08/16/24   Rolan Ezra RAMAN, MD  budesonide  (PULMICORT ) 0.5 MG/2ML nebulizer solution Take 2 mLs (0.5 mg total) by nebulization in the morning and at bedtime. 06/19/24   Tamea Dedra CROME, MD  dapagliflozin  propanediol (FARXIGA ) 10 MG TABS tablet TAKE 1 TABLET BY MOUTH DAILY BEFORE BREAKFAST. 06/11/24   Rolan Ezra RAMAN, MD  Ensifentrine  (OHTUVAYRE ) 3 MG/2.5ML SUSP Inhale 3 mLs into the lungs  2 (two) times daily. DX:J44.9 08/06/24   Tamea Dedra CROME, MD  ezetimibe  (ZETIA ) 10 MG tablet Take 1 tablet (10 mg total) by mouth daily. 03/23/24 09/13/24  Rolan Ezra RAMAN, MD  famotidine  (PEPCID ) 20 MG tablet TAKE 1 TABLET (20 MG TOTAL) BY MOUTH 2 (TWO) TIMES DAILY. FOR BREAKTHROUGH REFLUX 07/23/24   Tamea Dedra CROME, MD  FLUoxetine  (PROZAC ) 20 MG tablet Take 20 mg by mouth daily. 02/04/24   [provider]  fluticasone  (FLONASE ) 50 MCG/ACT nasal spray Place 2 sprays into both nostrils daily. 06/08/21   [provider]  furosemide  (LASIX ) 20 MG tablet TAKE 1 TABLET BY MOUTH AS NEEDED 08/10/24   McLean, Dalton S, MD  ipratropium-albuterol  (DUONEB) 0.5-2.5 (3) MG/3ML SOLN Take 3 mLs by nebulization every 6 (six) hours as needed. 03/06/24   Tamea Dedra CROME, MD  ketoconazole  (NIZORAL ) 2 % cream Apply 1 Application topically daily. 09/10/22   [provider]  loratadine  (CLARITIN ) 10 MG tablet Take 10 mg by mouth daily. 06/08/21   [provider]  Mepolizumab  (NUCALA ) 100 MG/ML SOAJ Inject 1 mL (100 mg total) into the skin every 28 (twenty-eight) days. Courier to pulm: [address], [place] [address]. Appt on 09/24/24 09/14/24   Tamea Dedra CROME, MD  methylPREDNISolone  (MEDROL  DOSEPAK) 4 MG TBPK tablet Take as directed in the package,this is a taper pack. 09/13/24   Tamea Dedra CROME, MD  mirtazapine  (REMERON ) 45 MG tablet Take 45 mg by mouth at bedtime. 06/29/22   [provider]  Multiple Vitamin (MULTIVITAMIN) capsule Take 1 capsule by mouth daily.    [provider]  nitroGLYCERIN  (NITROSTAT ) 0.4 MG SL tablet Place 1 tablet (0.4 mg total) under the tongue every 5 (five) minutes as needed. 07/28/22   Furth, Cadence H, PA-C  OXYGEN  Inhale 3 L into the lungs daily.    [provider]  pantoprazole  (PROTONIX ) 40 MG tablet Take 40 mg by mouth 2 (two) times daily before a meal. 01/26/24 01/25/25  [provider]   sacubitril -valsartan  (ENTRESTO ) 49-51 MG Take 1 tablet by mouth 2 (two) times daily. 03/20/24   Rolan Ezra RAMAN, MD  Spacer/Aero-Holding Chambers (AEROCHAMBER MV) inhaler Use as instructed 05/04/21   Bernardino Ditch, NP  spironolactone  (ALDACTONE ) 25 MG tablet Take 0.5 tablets (12.5 mg total) by mouth daily. 05/19/24 09/13/24  Amin, Sumayya, MD  sucralfate  (CARAFATE ) 1 g tablet Take 1 tablet (1 g total) by mouth 3 (three) times daily with meals. 1 hour before meals and medications 08/05/23   Cobb, Comer GAILS, NP  prochlorperazine  (COMPAZINE ) 10 MG tablet Take 1 tablet (10 mg total) by mouth every 6 (six) hours as needed (Nausea or vomiting). 05/31/22 08/29/22  Babara Call, MD    Physical Exam: Vitals:   09/16/24 2130 09/16/24 2200 09/16/24 2230 09/16/24 2300  BP: 138/69  134/71 126/87  Pulse: 67 62 72 (!) 59  Resp: 17 16 19 17   Temp:      TempSrc:      SpO2: 97% 98% 98% 98%  Weight:      Height:       Physical Exam Vitals and nursing note reviewed.  Constitutional:      General: He is not in acute distress. HENT:     Head: Normocephalic and atraumatic.  Cardiovascular:     Rate and Rhythm: Normal rate and regular rhythm.     Heart sounds: Normal heart sounds.     Comments: Pitting edema dorsum of feet, scattered wheezing Pulmonary:     Effort: Pulmonary effort is normal.     Breath sounds: Normal breath sounds.  Abdominal:     Palpations: Abdomen is soft.     Tenderness: There is no abdominal tenderness.  Musculoskeletal:     Right lower leg: Edema present.  Neurological:     Mental Status: Mental status is at baseline.     Labs on Admission: I have personally reviewed following labs and imaging studies  CBC: Recent Labs  Lab 09/16/24 2133  WBC 7.3  HGB 12.8*  HCT 41.3  MCV 93.9  PLT 257   Basic Metabolic Panel: Recent Labs  Lab 09/16/24 2133  NA 140  K 3.9  CL 97*  CO2 32  GLUCOSE 137*  BUN 20  CREATININE 1.06  CALCIUM  9.0   GFR: Estimated Creatinine  Clearance: 67 mL/min (by C-G formula based on SCr of 1.06 mg/dL). Liver Function Tests: No results for input(s): AST, ALT, ALKPHOS, BILITOT, PROT, ALBUMIN in the last 168 hours. No results for input(s): LIPASE, AMYLASE in the last 168 hours. No results for input(s): AMMONIA in the last 168 hours. Coagulation Profile: No results for input(s): INR, PROTIME in the last 168 hours. Cardiac Enzymes: No results for input(s): CKTOTAL, CKMB, CKMBINDEX, TROPONINI in the last 168 hours. BNP (last 3 results) No results for input(s): PROBNP in the last 8760 hours. HbA1C: No results for input(s): HGBA1C in the last 72 hours. CBG: No results for input(s): GLUCAP in the last 168 hours. Lipid Profile: No results for input(s): CHOL, HDL, LDLCALC, TRIG, CHOLHDL, LDLDIRECT in the last 72 hours. Thyroid Function Tests: No results for input(s): TSH, T4TOTAL, FREET4, T3FREE, THYROIDAB in the last 72 hours. Anemia Panel: No results for input(s): VITAMINB12, FOLATE, FERRITIN, TIBC, IRON, RETICCTPCT in the last 72 hours. Urine analysis:    Component Value Date/Time   COLORURINE STRAW (A) 01/08/2023 1035   APPEARANCEUR CLEAR (A) 01/08/2023 1035   APPEARANCEUR Clear 01/10/2013 1205   LABSPEC 1.005 01/08/2023 1035   LABSPEC 1.004 01/10/2013 1205   PHURINE 7.0 01/08/2023 1035   GLUCOSEU NEGATIVE 01/08/2023 1035   GLUCOSEU 50 mg/dL 98/77/7985 8794   HGBUR  NEGATIVE 01/08/2023 1035   BILIRUBINUR NEGATIVE 01/08/2023 1035   BILIRUBINUR negative 07/27/2017 1259   BILIRUBINUR neg 05/10/2014 1208   BILIRUBINUR Negative 01/10/2013 1205   KETONESUR NEGATIVE 01/08/2023 1035   PROTEINUR NEGATIVE 01/08/2023 1035   UROBILINOGEN 0.2 07/27/2017 1259   NITRITE NEGATIVE 01/08/2023 1035   LEUKOCYTESUR NEGATIVE 01/08/2023 1035   LEUKOCYTESUR Negative 01/10/2013 1205    Radiological Exams on Admission: DG Chest Port 1 View Result Date:  09/16/2024 CLINICAL DATA:  [id number] SOB (shortness of breath) [id number] Pt arrives via [facility] from home for difficulty breathing x3 days. Hx of COPD and on 3L of O2 at home. Pt audibly wheezing during triage EMS vitals: 96-98% on 3L 80 pulse 126/50 BP CHANGE PER DR QUALE PATIENT CONDITION EXAM: PORTABLE CHEST 1 VIEW COMPARISON:  Chest x-ray 08/07/2024 FINDINGS: The heart and mediastinal contours are unchanged. Atherosclerotic plaque. No focal consolidation. No pulmonary edema. No pleural effusion. No pneumothorax. No acute osseous abnormality. IMPRESSION: 1. No active disease. 2.  Aortic Atherosclerosis (ICD10-I70.0). Electronically Signed   By: Morgane  Naveau M.D.   On: 09/16/2024 22:43   Data Reviewed for HPI: Relevant notes from primary care and specialist visits, past discharge summaries as available in EHR, including Care Everywhere. Prior diagnostic testing as pertinent to current admission diagnoses Updated medications and problem lists for reconciliation ED course, including vitals, labs, imaging, treatment and response to treatment Triage notes, nursing and pharmacy notes and ED provider's notes Notable results as noted above in HPI      Assessment and Plan: * Stage IV COPD with acute exacerbation (HCC) Chronic respiratory failure with hypoxia Schedule and as needed nebulized bronchodilators IV steroids Flutter valve, incentive spirometer Mucolytic meds Continue supplemental oxygen   HFrEF (heart failure with reduced ejection fraction) (HCC) Possible acute exacerbation, mild Patient with swelling of feet, chest x-ray clear.  BNP slightly elevated at 110 Continue GDMT with bisoprolol , Entresto , spironolactone , Farxiga  Will switch to IV Lasix  while hospitalized Continue home spironolactone  Daily weights  Anxiety and depression Continue home fluoxetine  and alprazolam   Primary esophageal squamous cell carcinoma (HCC) No acute issues suspected  Prostate cancer (HCC) No acute issues  suspected  Controlled type 2 diabetes mellitus without complication, without long-term current use of insulin  (HCC) Continue Farxiga   Coronary artery disease Continue aspirin , atorvastatin , bisoprolol  and ezetimibe  with nitroglycerin  sublingual as needed     DVT prophylaxis: Lovenox   Consults: none  Advance Care Planning:   Code Status: Prior   Family Communication: none  Disposition Plan: Back to previous home environment  Severity of Illness: The appropriate patient status for this patient is OBSERVATION. Observation status is judged to be reasonable and necessary in order to provide the required intensity of service to ensure the patient's safety. The patient's presenting symptoms, physical exam findings, and initial radiographic and laboratory data in the context of their medical condition is felt to place them at decreased risk for further clinical deterioration. Furthermore, it is anticipated that the patient will be medically stable for discharge from the hospital within 2 midnights of admission.   Author: Delayne LULLA Solian, MD 09/17/2024 12:05 AM  For on call review [email or web address].

## 2024-09-17 NOTE — Assessment & Plan Note (Signed)
 No acute issues suspected

## 2024-09-17 NOTE — Assessment & Plan Note (Addendum)
 Continue Farxiga .  Last hemoglobin A1c back in May is 6.3

## 2024-09-17 NOTE — Progress Notes (Signed)
 Progress Note   Patient: Leslie Sims [facility] FMW:[id number] DOB: [birth year] DOA: 09/16/2024     0 DOS: the patient was seen and examined on 09/17/2024   Brief hospital course: 73 y.o. 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  He saw his pulmonologist on 9/25 who started him on Z pak and a prednisone  dose pak but symptoms persisted prompting the visit to the ED.  Of note, patient also noted some swelling of his feet that started 3 days prior.  He said his diuretic medication was changed some months ago by his cardiologist but was told to go back to his old diuretics if he had swelling and he started doing so when he noted the swelling 3 days ago.  He denies abdominal distention and denies chest pain and denies leg pain.  At baseline he sleeps in his recliner.  No worsening orthopnea from baseline In the ED his vitals were within normal limits and labs including CBC, BMP, troponin were all unremarkable as well as respiratory viral panel.  BNP slightly elevated at 110. EKG showing sinus at 61 and chest x-ray was nonacute After several DuoNebs and methylprednisolone  in the ED he continued to have increased work of breathing. Admission requested.   9/27.  Patient states his breathing is a little bit better today.  Continue IV Solu-Medrol  and IV Lasix  today and reassess tomorrow  Assessment and Plan: * Stage IV COPD with acute exacerbation (HCC) Chronic respiratory failure with hypoxia on 3 L Schedule and as needed nebulized bronchodilators, continue IV steroids Flutter valve, incentive spirometer Mucolytic meds Continue supplemental oxygen   HFrEF (heart failure with reduced ejection fraction) (HCC) Possible acute exacerbation, mild Patient with swelling of feet,  chest x-ray clear.  BNP slightly elevated at 110 Continue GDMT with bisoprolol , Entresto , spironolactone , Farxiga  Will switch to IV Lasix  while hospitalized Continue home spironolactone  Daily weights  Anxiety and depression Continue home fluoxetine  and alprazolam   Primary esophageal squamous cell carcinoma (HCC) No acute issues suspected  Prostate cancer (HCC) No acute issues suspected  Controlled type 2 diabetes mellitus without complication, without long-term current use of insulin  (HCC) Continue Farxiga .  Last hemoglobin A1c back in May is 6.3  Coronary artery disease Continue aspirin , atorvastatin , bisoprolol  and ezetimibe  with nitroglycerin  sublingual as needed        Subjective: Patient feels a little bit better but not back to his baseline breathing.  Wears 3 L chronically.  Coming in with shortness of breath.  Also had some lower extremity swelling  Physical Exam: Vitals:   09/17/24 0745 09/17/24 0800 09/17/24 0827 09/17/24 1011  BP:  135/85    Pulse:  82 (!) 111   Resp:  19 (!) 25   Temp:    97.9 F (36.6 C)  TempSrc:    Oral  SpO2: 100% 100% 96%   Weight:      Height:       Physical Exam HENT:     Head: Normocephalic.     Mouth/Throat:     Pharynx: No oropharyngeal exudate.  Eyes:     General: Lids are normal.     Conjunctiva/sclera: Conjunctivae normal.  Cardiovascular:     Rate and Rhythm: Regular rhythm. Tachycardia present.     Heart sounds: Normal heart sounds, S1 normal and S2 normal.  Pulmonary:     Breath sounds: Examination of the right-lower field reveals decreased breath sounds and rhonchi. Examination of the left-lower field reveals decreased breath sounds and rhonchi. Decreased breath sounds and rhonchi present. No wheezing or rales.  Abdominal:     Palpations: Abdomen is soft.     Tenderness: There is no abdominal tenderness.  Musculoskeletal:     Right lower leg: Swelling present.     Left lower leg: Swelling present.  Skin:     General: Skin is warm.     Findings: No rash.  Neurological:     Mental Status: He is alert and oriented to person, place, and time.     Data Reviewed: Creatinine 1.06, troponin 7, hemoglobin 12.8, white blood cell count 7.3, platelet count 257  Family Communication: Updated daughter on the phone  Disposition: Status is: Observation Reevaluate on a day-to-day basis on when to go home.  Continue IV Solu-Medrol  today.  Continue IV Lasix  today.  Planned Discharge Destination: Home    Time spent: 28 minutes  Author: Charlie Patterson, MD 09/17/2024 11:16 AM  For on call review [email or web address].

## 2024-09-17 NOTE — Assessment & Plan Note (Signed)
 Continue aspirin , atorvastatin , bisoprolol  and ezetimibe  with nitroglycerin  sublingual as needed

## 2024-09-17 NOTE — Assessment & Plan Note (Signed)
 Continue home fluoxetine  and alprazolam 

## 2024-09-17 NOTE — Assessment & Plan Note (Addendum)
 Possible acute exacerbation, mild Patient with swelling of feet, chest x-ray clear.  BNP slightly elevated at 110 Continue GDMT with bisoprolol , Entresto , spironolactone , Farxiga  Will switch to IV Lasix  while hospitalized Continue home spironolactone  Daily weights

## 2024-09-17 NOTE — Hospital Course (Signed)
 73 y.o. 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  He saw his pulmonologist on 9/25 who started him on Z pak and a prednisone  dose pak but symptoms persisted prompting the visit to the ED.  Of note, patient also noted some swelling of his feet that started 3 days prior.  He said his diuretic medication was changed some months ago by his cardiologist but was told to go back to his old diuretics if he had swelling and he started doing so when he noted the swelling 3 days ago.  He denies abdominal distention and denies chest pain and denies leg pain.  At baseline he sleeps in his recliner.  No worsening orthopnea from baseline In the ED his vitals were within normal limits and labs including CBC, BMP, troponin were all unremarkable as well as respiratory viral panel.  BNP slightly elevated at 110. EKG showing sinus at 61 and chest x-ray was nonacute After several DuoNebs and methylprednisolone  in the ED he continued to have increased work of breathing. Admission requested.   9/29.  Patient states his breathing is a little bit better today.  Continue IV Solu-Medrol  and IV Lasix  today and reassess tomorrow. 9/30.  Patient feeling better and ready to go home.  Patient has a steroid taper already at home.

## 2024-09-17 NOTE — ED Notes (Signed)
 Pt up to side of bed eating ordered breakfast tray. Pt states he has no needs at this time.

## 2024-09-18 DIAGNOSIS — J9611 Chronic respiratory failure with hypoxia: Secondary | ICD-10-CM | POA: Diagnosis not present

## 2024-09-18 DIAGNOSIS — I5020 Unspecified systolic (congestive) heart failure: Secondary | ICD-10-CM | POA: Diagnosis not present

## 2024-09-18 DIAGNOSIS — J441 Chronic obstructive pulmonary disease with (acute) exacerbation: Secondary | ICD-10-CM | POA: Diagnosis not present

## 2024-09-18 DIAGNOSIS — F419 Anxiety disorder, unspecified: Secondary | ICD-10-CM | POA: Diagnosis not present

## 2024-09-18 LAB — BASIC METABOLIC PANEL WITH GFR
Anion gap: 13 (ref 5–15)
BUN: 27 mg/dL — ABNORMAL HIGH (ref 8–23)
CO2: 33 mmol/L — ABNORMAL HIGH (ref 22–32)
Calcium: 9.4 mg/dL (ref 8.9–10.3)
Chloride: 95 mmol/L — ABNORMAL LOW (ref 98–111)
Creatinine, Ser: 1 mg/dL (ref 0.61–1.24)
GFR, Estimated: >60 mL/min (ref >60)
Glucose, Bld: 217 mg/dL — ABNORMAL HIGH (ref 70–99)
Potassium: 3.6 mmol/L (ref 3.5–5.1)
Sodium: 141 mmol/L (ref 135–145)

## 2024-09-18 LAB — GLUCOSE, CAPILLARY
Glucose-Capillary: 138 mg/dL — ABNORMAL HIGH (ref 70–99)
Glucose-Capillary: 136 mg/dL — ABNORMAL HIGH (ref 70–99)

## 2024-09-18 MED ORDER — AZITHROMYCIN 250 MG PO TABS: 500.0000 mg | ORAL_TABLET | ORAL | Status: AC

## 2024-09-18 MED ORDER — FUROSEMIDE 20 MG PO TABS: 20.0000 mg | ORAL_TABLET | Freq: Every day | ORAL | 0 refills | Status: AC

## 2024-09-18 NOTE — Plan of Care (Signed)

## 2024-09-18 NOTE — Discharge Summary (Signed)
 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  He saw his pulmonologist on 9/25 who started him on Z pak and a prednisone  dose pak but symptoms persisted prompting the visit to the ED.  Of note, patient also noted some swelling of his feet that started 3 days prior.  He said his diuretic medication was changed some months ago by his cardiologist but was told to go back to his old diuretics if he had swelling and he started doing so when he noted the swelling 3 days ago.  He denies abdominal distention and denies chest pain and denies leg pain.  At baseline he sleeps in his recliner.  No worsening orthopnea from baseline In the ED his vitals were within normal limits and labs including CBC, BMP, troponin were all unremarkable as well as respiratory viral panel.  BNP slightly elevated at 110. EKG showing sinus at 61 and chest x-ray was nonacute After several DuoNebs and  methylprednisolone  in the ED he continued to have increased work of breathing. Admission requested.   9/29.  Patient states his breathing is a little bit better today.  Continue IV Solu-Medrol  and IV Lasix  today and reassess tomorrow. 9/30.  Patient feeling better and ready to go home.  Patient has a steroid taper already at home.  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  He has a steroid taper at home.  Patient breathing better and ready to go home.  HFrEF (heart failure with reduced ejection fraction) (HCC) Possible acute exacerbation, mild Patient with swelling of feet, chest x-ray clear.  BNP slightly elevated at 110 Continue GDMT with bisoprolol , Entresto , spironolactone , Farxiga  Patient given IV Lasix  twice a day here.  Will give daily low-dose Lasix  at home. Last EF shows improved ejection fraction to 50%  Anxiety and depression Continue home fluoxetine  and alprazolam   Primary esophageal squamous cell carcinoma (HCC) No acute issues suspected  Prostate cancer (HCC) No acute issues suspected  Controlled type 2 diabetes mellitus without complication, without long-term current use of insulin  (HCC) Continue Farxiga .  Last hemoglobin A1c back in May is 6.3  Coronary artery disease Continue aspirin , atorvastatin , bisoprolol  and ezetimibe  with nitroglycerin  sublingual as needed         Consultants: None Procedures performed: None Disposition: Home Diet recommendation:  Cardiac diet DISCHARGE MEDICATION: Allergies as of 09/18/2024       Reactions   Prozac  [fluoxetine   Hcl] Shortness Of Breath   Hydroxyzine     Bupropion Other (See Comments)   Makes me feel funny Other reaction(s): Other (See Comments) Unknown/made me feel real funny Makes me feel funny Makes me feel funny Other reaction(s): Other (See Comments) Unknown/made me feel real funny Makes  me feel funny   Effexor Xr [venlafaxine Hcl Er] Other (See Comments)   Makes me feel funny        Medication List     STOP taking these medications    loratadine  10 MG tablet Commonly known as: CLARITIN    sucralfate  1 g tablet Commonly known as: Carafate        TAKE these medications    AeroChamber MV inhaler Use as instructed   albuterol  108 (90 Base) MCG/ACT inhaler Commonly known as: VENTOLIN  HFA TAKE 2 PUFFS BY MOUTH EVERY 6 HOURS AS NEEDED FOR WHEEZE OR SHORTNESS OF BREATH   ALPRAZolam  0.5 MG tablet Commonly known as: XANAX  Take 0.5 mg by mouth 3 (three) times daily.   aspirin  81 MG tablet Take 81 mg by mouth daily.   atorvastatin  80 MG tablet Commonly known as: LIPITOR Take 1 tablet (80 mg total) by mouth daily.   azithromycin  250 MG tablet Commonly known as: Zithromax  Take 2 tablets (500 mg total) by mouth 3 (three) times a week for 3 days. Start taking on: September 19, 2024 What changed:  medication strength when to take this   bisoprolol  5 MG tablet Commonly known as: ZEBETA  TAKE 1/2 TABLET BY MOUTH DAILY   budesonide  0.5 MG/2ML nebulizer solution Commonly known as: PULMICORT  Take 2 mLs (0.5 mg total) by nebulization in the morning and at bedtime.   ezetimibe  10 MG tablet Commonly known as: ZETIA  Take 1 tablet (10 mg total) by mouth daily.   famotidine  20 MG tablet Commonly known as: PEPCID  TAKE 1 TABLET (20 MG TOTAL) BY MOUTH 2 (TWO) TIMES DAILY. FOR BREAKTHROUGH REFLUX   Farxiga  10 MG Tabs tablet Generic drug: dapagliflozin  propanediol TAKE 1 TABLET BY MOUTH DAILY BEFORE BREAKFAST.   FLUoxetine  20 MG tablet Commonly known as: PROZAC  Take 20 mg by mouth daily.   fluticasone  50 MCG/ACT nasal spray Commonly known as: FLONASE  Place 2 sprays into both nostrils daily.   furosemide  20 MG tablet Commonly known as: LASIX  Take 1 tablet (20 mg total) by mouth daily. What changed:  when to take this reasons to take this    ipratropium-albuterol  0.5-2.5 (3) MG/3ML Soln Commonly known as: DUONEB Take 3 mLs by nebulization every 6 (six) hours as needed.   ketoconazole  2 % cream Commonly known as: NIZORAL  Apply 1 Application topically daily.   methylPREDNISolone  4 MG Tbpk tablet Commonly known as: MEDROL  DOSEPAK Take as directed in the package,this is a taper pack.   mirtazapine  45 MG tablet Commonly known as: REMERON  Take 45 mg by mouth at bedtime.   multivitamin capsule Take 1 capsule by mouth daily.   nitroGLYCERIN  0.4 MG SL tablet Commonly known as: NITROSTAT  Place 1 tablet (0.4 mg total) under the tongue every 5 (five) minutes as needed.   Nucala  100 MG/ML Soaj Generic drug: Mepolizumab  Inject 1 mL (100 mg total) into the skin every 28 (twenty-eight) days. Courier to pulm: [address], [address], [place] [address]. Appt on 09/24/24   Ohtuvayre  3 MG/2.5ML Susp Generic drug: Ensifentrine  Inhale 3 mLs into the lungs 2 (two) times daily. DX:J44.9   OXYGEN  Inhale 3 L into the lungs daily.   pantoprazole  40 MG tablet Commonly  known as: PROTONIX  Take 40 mg by mouth 2 (two) times daily before a meal.   sacubitril -valsartan  49-51 MG Commonly known as: ENTRESTO  Take 1 tablet by mouth 2 (two) times daily.   spironolactone  25 MG tablet Commonly known as: ALDACTONE  Take 0.5 tablets (12.5 mg total) by mouth daily.        Follow-up Information     Claudene Rayfield HERO, MD Follow up in 5 day(s).   Specialty: Family Medicine Contact information: [address] [place] [address] [phone]                Discharge Exam: Fredricka Weights   09/16/24 2116  Weight: 81.4 kg   Physical Exam HENT:     Head: Normocephalic.     Mouth/Throat:     Pharynx: No oropharyngeal exudate.  Eyes:     General: Lids are normal.     Conjunctiva/sclera: Conjunctivae normal.  Cardiovascular:     Rate and Rhythm: Normal rate and regular rhythm.     Heart sounds: Normal heart sounds,  S1 normal and S2 normal.  Pulmonary:     Breath sounds: Examination of the right-lower field reveals decreased breath sounds. Examination of the left-lower field reveals decreased breath sounds. Decreased breath sounds present. No wheezing, rhonchi or rales.  Abdominal:     Palpations: Abdomen is soft.     Tenderness: There is no abdominal tenderness.  Musculoskeletal:     Right lower leg: Swelling present.     Left lower leg: Swelling present.  Skin:    General: Skin is warm.     Findings: No rash.  Neurological:     Mental Status: He is alert and oriented to person, place, and time.      Condition at discharge: stable  The results of significant diagnostics from this hospitalization (including imaging, microbiology, ancillary and laboratory) are listed below for reference.   Imaging Studies: DG Chest Port 1 View Result Date: 09/16/2024 CLINICAL DATA:  [id number] SOB (shortness of breath) [id number] Pt arrives via [facility] from home for difficulty breathing x3 days. Hx of COPD and on 3L of O2 at home. Pt audibly wheezing during triage EMS vitals: 96-98% on 3L 80 pulse 126/50 BP CHANGE PER DR QUALE PATIENT CONDITION EXAM: PORTABLE CHEST 1 VIEW COMPARISON:  Chest x-ray 08/07/2024 FINDINGS: The heart and mediastinal contours are unchanged. Atherosclerotic plaque. No focal consolidation. No pulmonary edema. No pleural effusion. No pneumothorax. No acute osseous abnormality. IMPRESSION: 1. No active disease. 2.  Aortic Atherosclerosis (ICD10-I70.0). Electronically Signed   By: Morgane  Naveau M.D.   On: 09/16/2024 22:43   CT CHEST ABDOMEN PELVIS W CONTRAST Result Date: 09/10/2024 CLINICAL DATA:  Esophageal cancer restaging.  Prostate cancer. * Tracking Code: BO * EXAM: CT CHEST, ABDOMEN, AND PELVIS WITH CONTRAST TECHNIQUE: Multidetector CT imaging of the chest, abdomen and pelvis was performed following the standard protocol during bolus administration of intravenous contrast. RADIATION DOSE REDUCTION:  This exam was performed according to the departmental dose-optimization program which includes automated exposure control, adjustment of the mA and/or kV according to patient size and/or use of iterative reconstruction technique. CONTRAST:  OMNIPAQUE  IOHEXOL  300 MG/ML  SOLN COMPARISON:  Multiple exams, including 06/10/2024 and 01/26/2024 FINDINGS: CT CHEST FINDINGS Cardiovascular: Coronary, aortic arch, and branch vessel atherosclerotic vascular disease. Fat density along the left ventricular apex and adjacent interventricular septum could be related to a prior myocardial infarction, correlate with patient history. Mediastinum/Nodes: Wall thickening observed in the distal 30% of the thoracic  esophagus, nonspecific for esophagitis versus tumor. No pathologic adenopathy observed. Homogeneous presumably calcified 1.1 cm right axillary lymph node on image 14 series 2 is unchanged. Lungs/Pleura: Improved aeration in both lung apices although with some residual bandlike density and adjacent ground-glass opacity at the right lung apex. Consolidation anteriorly in the right upper lobe is reduced although there still some bandlike density and pleural thickening as on image 72 series 4. Airway thickening is present, suggesting bronchitis or reactive airways disease. Emphysema noted. Subpleural consolidation anteriorly in the left upper lobe is substantially reduced. Mild new airspace opacity in the superior segment left lower lobe as on image 53 series 4, likely inflammatory or infectious. Densely calcified 9 mm left upper lobe nodule, likely postinflammatory such as a calcified granuloma. Mild lingular scarring. Minimal scarring laterally at the left lung base appears unchanged. Musculoskeletal: Stable lucent 1.3 by 0.7 cm lesion in the right anterior third rib on image 52 series 4, this is been present at least since 09/13/2018 and accordingly is not considered an active process. 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 Lower thoracic spondylosis. CT ABDOMEN PELVIS FINDINGS Hepatobiliary: No significant abnormal hypermetabolic activity in this region. Pancreas: Unremarkable Spleen: Punctate calcifications compatible with old granulomatous disease. Adrenals/Urinary Tract: Chronic 2.7 by 2.3 cm left adrenal mass on image 67 series 2, shown on prior imaging to be an adenoma with internal density of 6 Hounsfield units on prior PET-CT noncontrast CT images from 12/06/2022. Similar 1.4 by 0.9 cm right adrenal adenoma noncontrast density of -3 Hounsfield units on the prior PET-CT. No further imaging workup of these lesions is indicated. The kidneys appear unremarkable. Stomach/Bowel: Sigmoid colon diverticulosis. Upper normal caliber loops of jejunum in the left upper quadrant with air-fluid levels, cannot exclude mild proximal enteritis but no bowel wall thickening is observed. Vascular/Lymphatic: Atherosclerosis is present, including aortoiliac atherosclerotic disease. Reproductive: Penile prosthesis observed, collapsed reservoir suggesting past leak from the reservoir. Prostate fiducials noted. Other: No supplemental non-categorized findings. Musculoskeletal: Right groin hernia contains adipose tissue. Bridging spurring of the left sacroiliac joint. Moderate degenerative hip arthropathy bilaterally. Lower lumbar spondylosis and degenerative disc disease with suspected right foraminal impingement at L4-5 at L5-S1. IMPRESSION: 1. Wall thickening in the distal 30% of the thoracic esophagus, nonspecific for esophagitis versus tumor. 2. Improved aeration in both lung apices although with some residual bandlike density and adjacent ground-glass opacity at the right lung apex. Consolidation anteriorly in the right upper lobe is reduced although there still some bandlike density and pleural thickening. Subpleural consolidation anteriorly in the left upper lobe is substantially reduced. 3. Mild new airspace opacity in the  superior segment left lower lobe, likely inflammatory or infectious, but surveillance is recommended. 4. Airway thickening is present, suggesting bronchitis or reactive airways disease. 5. Stable lucent lesion in the right anterior third rib, this has been present at least since 09/13/2018 and accordingly is not considered an active process. 6. Stable bilateral adrenal adenomas. 7. Sigmoid colon diverticulosis. 8. Right groin hernia contains adipose tissue. 9. Lower lumbar spondylosis and degenerative disc disease with suspected right foraminal impingement at L4-5 and L5-S1. 10. Fat density along the left ventricular apex and adjacent interventricular septum could be related to a prior myocardial infarction, correlate with patient history. 11. Aortic Atherosclerosis (ICD10-I70.0) and Emphysema (ICD10-J43.9). Electronically Signed   By: Ryan Salvage M.D.   On: 09/10/2024 15:26    Microbiology: Results for orders placed or performed during the hospital encounter of 09/16/24  Resp  panel by RT-PCR (RSV, Flu A&B, Covid) Anterior Nasal Swab     Status: None   Collection Time: 09/16/24  9:33 PM   Specimen: Anterior Nasal Swab  Result Value Ref Range Status   SARS Coronavirus 2 by RT PCR NEGATIVE NEGATIVE Final    Comment: (NOTE) SARS-CoV-2 target nucleic acids are NOT DETECTED.  The SARS-CoV-2 RNA is generally detectable in upper respiratory specimens during the acute phase of infection. The lowest concentration of SARS-CoV-2 viral copies this assay can detect is 138 copies/mL. A negative result does not preclude SARS-Cov-2 infection and should not be used as the sole basis for treatment or other patient management decisions. A negative result may occur with  improper specimen collection/handling, submission of specimen other than nasopharyngeal swab, presence of viral mutation(s) within the areas targeted by this assay, and inadequate number of viral copies(<138 copies/mL). A negative result  must be combined with clinical observations, patient history, and epidemiological information. The expected result is Negative.  Fact Sheet for Patients:  BloggerCourse.com  Fact Sheet for Healthcare Providers:  SeriousBroker.it  This test is no t yet approved or cleared by the [place]  FDA and  has been authorized for detection and/or diagnosis of SARS-CoV-2 by FDA under an Emergency Use Authorization (EUA). This EUA will remain  in effect (meaning this test can be used) for the duration of the COVID-19 declaration under Section 564(b)(1) of the Act, 21 U.S.C.section 360bbb-3(b)(1), unless the authorization is terminated  or revoked sooner.       Influenza A by PCR NEGATIVE NEGATIVE Final   Influenza B by PCR NEGATIVE NEGATIVE Final    Comment: (NOTE) The Xpert Xpress SARS-CoV-2/FLU/RSV plus assay is intended as an aid in the diagnosis of influenza from Nasopharyngeal swab specimens and should not be used as a sole basis for treatment. Nasal washings and aspirates are unacceptable for Xpert Xpress SARS-CoV-2/FLU/RSV testing.  Fact Sheet for Patients: BloggerCourse.com  Fact Sheet for Healthcare Providers: SeriousBroker.it  This test is not yet approved or cleared by the [place]  FDA and has been authorized for detection and/or diagnosis of SARS-CoV-2 by FDA under an Emergency Use Authorization (EUA). This EUA will remain in effect (meaning this test can be used) for the duration of the COVID-19 declaration under Section 564(b)(1) of the Act, 21 U.S.C. section 360bbb-3(b)(1), unless the authorization is terminated or revoked.     Resp Syncytial Virus by PCR NEGATIVE NEGATIVE Final    Comment: (NOTE) Fact Sheet for Patients: BloggerCourse.com  Fact Sheet for Healthcare Providers: SeriousBroker.it  This test is  not yet approved or cleared by the [place]  FDA and has been authorized for detection and/or diagnosis of SARS-CoV-2 by FDA under an Emergency Use Authorization (EUA). This EUA will remain in effect (meaning this test can be used) for the duration of the COVID-19 declaration under Section 564(b)(1) of the Act, 21 U.S.C. section 360bbb-3(b)(1), unless the authorization is terminated or revoked.  Performed at [facility], [address] [place], [place] [address]    *Note: Due to a large number of results and/or encounters for the requested time period, some results have not been displayed. A complete set of results can be found in Results Review.    Labs: CBC: Recent Labs  Lab 09/16/24 2133  WBC 7.3  HGB 12.8*  HCT 41.3  MCV 93.9  PLT 257   Basic Metabolic Panel: Recent Labs  Lab 09/16/24 2133 09/18/24 0922  NA 140 141  K 3.9 3.6  CL 97* 95*  CO2 32 33*  GLUCOSE 137* 217*  BUN 20 27*  CREATININE 1.06 1.00  CALCIUM  9.0 9.4   Liver Function Tests: No results for input(s): AST, ALT, ALKPHOS, BILITOT, PROT, ALBUMIN in the last 168 hours. CBG: Recent Labs  Lab 09/17/24 1203 09/17/24 1613 09/17/24 2216 09/18/24 0434 09/18/24 0730  GLUCAP 127* 126* 85 138* 136*    Discharge time spent: greater than 30 minutes.  Signed: Charlie Patterson, MD [facility] 09/18/2024

## 2024-09-18 NOTE — Telephone Encounter (Signed)
 See 09/14/2024 telephone encounter. NFN.

## 2024-09-19 ENCOUNTER — Other Ambulatory Visit: Payer: Self-pay

## 2024-09-19 NOTE — Progress Notes (Signed)
 Specialty Pharmacy Initial Fill Coordination Note  Leslie Sims is a 73 y.o. male contacted today regarding initial fill of specialty medication(s) Mepolizumab  (Nucala )   Patient requested Courier to Provider Office   Delivery date: 09/24/24   Verified address: [address], [place], [place] [address]   Medication will be filled on 10/2.   Patient is aware of $0 copayment.    **Please mail welcome packet and call pt for refills (opted out of MyChart questionnaires)**

## 2024-09-20 ENCOUNTER — Other Ambulatory Visit: Payer: Self-pay

## 2024-09-24 ENCOUNTER — Other Ambulatory Visit: Payer: Self-pay

## 2024-09-24 ENCOUNTER — Ambulatory Visit

## 2024-09-24 ENCOUNTER — Other Ambulatory Visit (HOSPITAL_COMMUNITY): Payer: Self-pay

## 2024-09-24 DIAGNOSIS — J449 Chronic obstructive pulmonary disease, unspecified: Secondary | ICD-10-CM

## 2024-09-24 DIAGNOSIS — Z7189 Other specified counseling: Secondary | ICD-10-CM | POA: Diagnosis not present

## 2024-09-24 MED ORDER — NUCALA 100 MG/ML ~~LOC~~ SOAJ
100.0000 mg | SUBCUTANEOUS | 3 refills | Status: AC
  Filled 2024-09-24 – 2024-11-12 (×2): qty 1, 28d supply, fill #0
  Filled 2024-12-10: qty 1, 28d supply, fill #1
  Filled 2025-01-07 – 2025-01-22 (×8): qty 1, 28d supply, fill #2

## 2024-09-24 NOTE — Progress Notes (Signed)
 HPI Patient presents today to [facility] to see pharmacy team for Nucala  new start. He has severe COPD and asthma. PMH also includes bronchiectasis. He has frequent exacerbations evidenced by 8 prednisone  prescriptions dispensed since Jan 2025.  Respiratory Medications Current regimen: Ohtuvayre  3mg /2.42mL susp (Inhale 3 mLs into the lungs 2 (two) times daily), Pulmicort  0.5mg /23mL neb soln (Take 2 mLs (0.5 mg total) by nebulization in the morning and at bedtime), DuoNeb 0.5-2.5 mg/35mL soln (Take 3 mLs by nebulization every 6 (six) hours as needed), Ventolin  108 mcg/act (Take 2 puffs by mouth every 6 hours PRN wheeze or SOB)   Patient reports no known adherence challenges  OBJECTIVE Allergies  Allergen Reactions   Prozac  [Fluoxetine  Hcl] Shortness Of Breath   Hydroxyzine     Bupropion Other (See Comments)    Makes me feel funny Other reaction(s): Other (See Comments) Unknown/made me feel real funny Makes me feel funny Makes me feel funny Other reaction(s): Other (See Comments) Unknown/made me feel real funny Makes me feel funny   Effexor Xr [Venlafaxine Hcl Er] Other (See Comments)    Makes me feel funny    Outpatient Encounter Medications as of 09/24/2024  Medication Sig Note   albuterol  (VENTOLIN  HFA) 108 (90 Base) MCG/ACT inhaler TAKE 2 PUFFS BY MOUTH EVERY 6 HOURS AS NEEDED FOR WHEEZE OR SHORTNESS OF BREATH    ALPRAZolam  (XANAX ) 0.5 MG tablet Take 0.5 mg by mouth 3 (three) times daily.    aspirin  81 MG tablet Take 81 mg by mouth daily.    atorvastatin  (LIPITOR) 80 MG tablet Take 1 tablet (80 mg total) by mouth daily.    bisoprolol  (ZEBETA ) 5 MG tablet TAKE 1/2 TABLET BY MOUTH DAILY    budesonide  (PULMICORT ) 0.5 MG/2ML nebulizer solution Take 2 mLs (0.5 mg total) by nebulization in the morning and at bedtime.    dapagliflozin  propanediol (FARXIGA ) 10 MG TABS tablet TAKE 1 TABLET BY MOUTH DAILY BEFORE BREAKFAST.    Ensifentrine  (OHTUVAYRE ) 3 MG/2.5ML SUSP Inhale  3 mLs into the lungs 2 (two) times daily. DX:J44.9    ezetimibe  (ZETIA ) 10 MG tablet Take 1 tablet (10 mg total) by mouth daily.    famotidine  (PEPCID ) 20 MG tablet TAKE 1 TABLET (20 MG TOTAL) BY MOUTH 2 (TWO) TIMES DAILY. FOR BREAKTHROUGH REFLUX    FLUoxetine  (PROZAC ) 20 MG tablet Take 20 mg by mouth daily.    fluticasone  (FLONASE ) 50 MCG/ACT nasal spray Place 2 sprays into both nostrils daily.    furosemide  (LASIX ) 20 MG tablet Take 1 tablet (20 mg total) by mouth daily.    ipratropium-albuterol  (DUONEB) 0.5-2.5 (3) MG/3ML SOLN Take 3 mLs by nebulization every 6 (six) hours as needed. 05/15/2024: prn   ketoconazole  (NIZORAL ) 2 % cream Apply 1 Application topically daily. 09/17/2024: PRN   Mepolizumab  (NUCALA ) 100 MG/ML SOAJ Inject 1 mL (100 mg total) into the skin every 28 (twenty-eight) days. Courier to pulm: [address], [address], [place] [address]. Appt on 09/24/24 09/17/2024: Hasn't started yet   methylPREDNISolone  (MEDROL  DOSEPAK) 4 MG TBPK tablet Take as directed in the package,this is a taper pack.    mirtazapine  (REMERON ) 45 MG tablet Take 45 mg by mouth at bedtime.    Multiple Vitamin (MULTIVITAMIN) capsule Take 1 capsule by mouth daily.    nitroGLYCERIN  (NITROSTAT ) 0.4 MG SL tablet Place 1 tablet (0.4 mg total) under the tongue every 5 (five) minutes as needed. 05/15/2024: prn   OXYGEN  Inhale 3 L into the lungs daily.  pantoprazole  (PROTONIX ) 40 MG tablet Take 40 mg by mouth 2 (two) times daily before a meal.    sacubitril -valsartan  (ENTRESTO ) 49-51 MG Take 1 tablet by mouth 2 (two) times daily.    Spacer/Aero-Holding Chambers (AEROCHAMBER MV) inhaler Use as instructed    spironolactone  (ALDACTONE ) 25 MG tablet Take 0.5 tablets (12.5 mg total) by mouth daily.    [DISCONTINUED] prochlorperazine  (COMPAZINE ) 10 MG tablet Take 1 tablet (10 mg total) by mouth every 6 (six) hours as needed (Nausea or vomiting).    No facility-administered encounter medications on file as of 09/24/2024.      Immunization History  Administered Date(s) Administered    sv, Bivalent, Protein Subunit Rsvpref,pf (Abrysvo) 02/20/2023   Fluad Quad(high Dose 65+) 11/16/2021, 11/05/2022   INFLUENZA, HIGH DOSE SEASONAL PF 09/28/2019, 10/21/2023   Influenza Whole 09/19/2012   Influenza,inj,Quad PF,6+ Mos 10/24/2013, 11/25/2014, 03/02/2016, 10/27/2016, 10/05/2017   Influenza-Unspecified 10/24/2013, 11/25/2014, 03/02/2016, 10/27/2016, 10/05/2017, 09/18/2018, 09/28/2019, 09/30/2020   Moderna Covid-19 Fall Seasonal Vaccine 85yrs & older 11/05/2022, 03/29/2023, 10/21/2023, 03/30/2024   Moderna Covid-19 Seasonal Vaccine 6 months thru 71years of age 15/17/2023   Moderna Sars-Covid-2 Vaccination 01/26/2020, 02/23/2020   PFIZER Comirnaty(Gray Top)Covid-19 Tri-Sucrose Vaccine 12/15/2020, 07/15/2021   PFIZER(Purple Top)SARS-COV-2 Vaccination 01/26/2020, 02/23/2020   PNEUMOCOCCAL CONJUGATE-20 03/22/2022   Pfizer Covid-19 Vaccine Bivalent Booster 13yrs & up 01/14/2022, 05/25/2022   Pneumococcal Conjugate-13 11/25/2014   Pneumococcal Polysaccharide-23 08/24/2016, 10/27/2016   Pneumococcal-Unspecified 12/20/2008   Tdap 12/20/2008     PFTs    Latest Ref Rng & Units 01/29/2021    3:57 PM  PFT Results  FVC-Predicted Pre % 42  P  FVC-Post L 1.83  P  FVC-Predicted Post % 40  P  Pre FEV1/FVC % % 39  P  Post FEV1/FCV % % 37  P  FEV1-Pre L 0.74  P  FEV1-Predicted Pre % 22  P  FEV1-Post L 0.68  P  DLCO uncorrected ml/min/mmHg 5.57  P  DLCO UNC% % 21  P  DLVA Predicted % 45  P    P Preliminary result     Eosinophils Most recent blood eosinophil count was 200 cells/microL taken on 09/06/24.   Assessment   Biologics training for mepolizumab  (Nucala )  Goals of therapy: Mechanism of Action: Not fully understood. It does act an interleukin-5 (IL-5) antagonist monoclonal antibody that reduces the production and survival of eosinophils by blocking the binding of IL-5 to the alpha chain of the receptor complex  on the eosinophil cell surface. Reviewed that Nucala  is add-on medication and patient must continue maintenance inhaler regimen. Response to therapy: may take 3 months to 6 months to determine efficacy. Discussed that patients generally feel improvement sooner than 3 months.  Side effects: headache (19%), injection site reaction (7-15%), antibody development (6%), backache (5%), fatigue (5%)  Dose: 100 mg subcutaneously every 4 weeks  Administration/Storage:  Reviewed administration sites of thigh or abdomen (at least 2-3 inches away from abdomen). Reviewed the upper arm is only appropriate if caregiver is administering injection  Do not shake the reconstituted solution as this could lead to product foaming or precipitation. Solution should be clear to opalescent and colorless to pale yellow or pale brown, essentially particle free. Small air bubbles, however, are expected and acceptable. If particulate matter remains in the solution or if the solution appears cloudy or milky, discard the solution.  Reviewed storage of medication in refrigerator. Reviewed that Nucala  can be stored at room temperature in unopened carton for up to 7 days.  Access:  Approval of Nucala  through: insurance  Patient self-administered Nucala  100mg /mL in right lower abdomen using [facility]-supplied pen.   Nucala  100mg /mL autoinjector pen NDC: 9826-9107-98 Lot: [id number] Expiration: 2028-APR  Patient monitored for 30 minutes for adverse reaction.  Patient tolerated well.  Patient denies itchiness and irritation at injection.  Medication Reconciliation  A drug regimen assessment was performed, including review of allergies, interactions, disease-state management, dosing and immunization history. Medications were reviewed with the patient, including name, instructions, indication, goals of therapy, potential side effects, importance of adherence, and safe use.   PLAN Continue Nucala  100mg  every 4 weeks. Next dose is due  10/22/24 and every 4 weeks thereafter. Rx sent to: [facility]: [phone] . Patient provided with pharmacy phone number.  Continue regimen:  Ohtuvayre  3mg /2.58mL susp (Inhale 3 mLs into the lungs 2 (two) times daily), Pulmicort  0.5mg /79mL neb soln (Take 2 mLs (0.5 mg total) by nebulization in the morning and at bedtime), DuoNeb 0.5-2.5 mg/69mL soln (Take 3 mLs by nebulization every 6 (six) hours as needed), Ventolin  108 mcg/act (Take 2 puffs by mouth every 6 hours PRN wheeze or SOB)  Follow-up with Dr. Tamea as planned on 11/06/24.   All questions encouraged and answered.  Instructed patient to reach out with any further questions or concerns.  Thank you for allowing pharmacy to participate in this patient's care.  This appointment required 45 minutes of patient care (this includes precharting, chart review, review of results, face-to-face care, etc.).

## 2024-09-24 NOTE — Patient Instructions (Signed)
 Your next Nucala  dose is due on 10/22/24, 11/19/24, and every 28 days thereafter  CONTINUE all other respiratory medications as prescribed by Dr. Tamea.  Your prescription will be shipped from [facility]. Their phone number is [phone].  Someone will call to schedule shipment and confirm address. They will mail your medication to your home.  You will need to be seen by your provider in 3 to 4 months to assess how Nucala  is working for you. You have a follow-up appointment scheduled on 11/06/24 with Dr. Tamea. Call our clinic if you need to make this appointment.  Stay up to date on all routine vaccines: influenza, pneumonia, COVID19, Shingles  How to manage an injection site reaction: Remember the 5 C's: COUNTER - leave on the counter at least 30 minutes but up to overnight to bring medication to room temperature. This may help prevent stinging COLD - place something cold (like an ice gel pack or cold water bottle) on the injection site just before cleansing with alcohol. This may help reduce pain CLARITIN  - use Claritin  (generic name is loratadine ) for the first two weeks of treatment or the day of, the day before, and the day after injecting. This will help to minimize injection site reactions CORTISONE CREAM - apply if injection site is irritated and itching CALL ME - if injection site reaction is bigger than the size of your fist, looks infected, blisters, or if you develop hives

## 2024-09-27 NOTE — Progress Notes (Unsigned)
 PCP: Claudene Rayfield HERO, MD Cardiology: Dr. Darron HF Cardiology: Dr. Rolan  Chief complaint: shortness of breath   HPI:  Ms Guay is a 73 y.o. male with a history of CAD, ischemic cardiomyopathy, and severe COPD on home oxygen  was referred by Dr. Darron for evaluation of heart failure. Patient had an anterior MI in 2010.  At that time, he had DES to LAD and to RCA.  Repeat caths in 2017 and in 4/24 have shown patent stents and minimal progression of CAD.  Echoes showed EF 40-45% generally, then echo in 9/23 showed EF up to 50-55% though study was technically difficult.  LV-gram with cath in 4/24 suggested lower EF at 25-35%. Patient was a heavy smoker in the past, quit in 2020.  He has severe COPD and wears 3L home oxygen . 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    Due to progressive dyspnea and atypical chest pain, he had RHC/LHC done in 4/24.  Results of angiography and LV-gram as above, RHC showed normal filling pressures and preserved cardiac output.  He went to the ER 04/24 with cough and worsening dyspnea, he was thought to have a COPD exacerbation and was given prednisone  and nebs.    Echo in 5/24 showed EF 35-40%, mild LV dilation, normal RV.    Echo in 4/25 showed EF up to 50-55%, RV normal, IVC normal.   Admitted 05/14/24 with worsening SOB over previous week. + productive cough, EMS had oxygen  sats of 80's on 3L. Given nebulizer and IV solu-medrol . Found to have COPD exacerbation along with sepsis due to pneumonia. Treated with antibiotics and symptoms improved.   Admitted 09/16/24 with shortness of breath and increased swelling of feet. No worsening orthopnea. BNP slightly elevated in ED at 110. CXR was nonacute. Given IV solu-medrol  and IV lasix  with improvement of symptoms.   He presents today for an acute HF visit with a chief complaint of moderate shortness of breath. Has associated  fatigue, chest discomfort, lightheaded at times, pedal edema (improved over last couple of days). Sleeping well. He generally takes his furosemide  PRN but for the last 2 days, he's taken it daily and reports improvement in SOB/ pedal edema. Wearing oxygen  at 3L with increase to 4L when showering.   Saturday (6 days ago) he and his daughter went shopping and he stayed in the car. Was getting hot in the car and feels like this is when his SOB worsened.                                                                                                      Labs (2/24): LDL 47 Labs (4/24): hgb 13.3, K 4.1, creatinine 0.95 Labs (6/24): BNP 42, K 3.9, creatinine 0.93 Labs (7/24): LDL 58 Labs (2/25): K 4.6, creatinine 1.15 Labs (3/25): LDL 69 Labs (6/25): K 3.9, creatinine 1.05 Labs (7/25): K 3.9, creatinine 0.75, BNP 165.7 Labs (8/25): K 4.1, creatinine 0.75, Hg 12.5, HS troponin 5 Labs (9/25): K 3.6, creatinine 1.00  PMH: 1.  CAD: Anterior MI in 2010 with DES to LAD and RCA.  - LHC (2017): Patent stents.  - LHC (4/24): Mild nonobstructive CAD with patent stents.  2. Chronic systolic CHF: Ischemic cardiomyopathy.   - Echo (2019): EF 40-45% - Echo (2/22): EF 40-45% - Echo (9/23): Technically difficult, EF 50-55%.  - LV-gram (4/24): EF 25-35% - RHC (4/24): mean RA 6, PA 38/15, mean PCWP 15, CI 3.16 - Echo (5/24): EF 35-40%, mild LV dilation, normal RV. - Echo (4/25): EF 50-55%, RV normal, IVC normal. 3. COPD: On 3.5 L home oxygen , severe. Quit smoking 2020.  4. H/o PVCs 5. Esophageal squamous cell carcinoma: S/p chemo-radiation.  6. Prostate cancer: Radiation.   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  Vaping Use   Vaping status: Never Used  Substance and Sexual Activity   Alcohol use: No    Alcohol/week: 0.0 standard drinks of alcohol   Drug use: No   Sexual activity: Yes  Other Topics Concern   Not on file  Social History Narrative   Marital status: Widowed. Lives alone.    Social Drivers of Corporate investment banker Strain: Low Risk  (09/19/2024)   Received from [facility]   Overall Financial Resource Strain (CARDIA)    Difficulty of Paying Living Expenses: Not hard at all  Food Insecurity: No Food Insecurity (09/19/2024)   Received from [facility]   Hunger Vital Sign    Within the past 12 months, you worried that your food would run out before you got the money to buy more.: Never true    Within the past 12 months, the food you bought just didn't last and you didn't have money to get more.: Never true  Transportation Needs: No Transportation Needs (09/19/2024)   Received from [facility] - Transportation    In the past 12 months, has lack of transportation kept you from medical appointments or from getting medications?: No    Lack of Transportation (Non-Medical): No  Physical Activity: Inactive (09/10/2024)   Received from [facility]   Exercise Vital Sign    On average, how many days per week do you engage in moderate to strenuous exercise (like a brisk walk)?: 0 days    On average, how many minutes do you engage in exercise at this level?: 0 min  Stress: No Stress Concern Present (09/10/2024)   Received from [facility] - Occupational Stress  Questionnaire    Feeling of Stress : Not at all  Social Connections: Unknown (09/17/2024)   Social Connection and Isolation Panel    Frequency of Communication with Friends and Family: Patient declined    Frequency of Social Gatherings with Friends  and Family: Patient declined    Attends Religious Services: Not on file    Active Member of Clubs or Organizations: Not on file    Attends Club or Organization Meetings: Not on file    Marital Status: Not on file  Recent Concern: Social Connections - Moderately Isolated (09/10/2024)   Received from [facility]   Social Connection and Isolation Panel    In a typical week, how many times do you talk on the phone with family, friends, or neighbors?: Twice a week    How often do you get together with friends or relatives?: Once a week    How often do you attend church or religious services?: More than 4 times per year    Do you belong to any clubs or organizations such as church groups, unions, fraternal or athletic groups, or school groups?: No    How often do you attend meetings of the clubs or organizations you belong to?: Never    Are you married, widowed, divorced, separated, never married, or living with a partner?: Widowed  Intimate Partner Violence: Unknown (09/17/2024)   Humiliation, Afraid, Rape, and Kick questionnaire    Fear of Current or Ex-Partner: Patient declined    Emotionally Abused: Patient declined    Physically Abused: Not on file    Sexually Abused: Patient declined   Family History  Problem Relation Age of Onset   Heart attack Brother        Brother #1   Diabetes Brother    Hypertension Brother        #3   Coronary artery disease Father 1       deceased   Heart attack Father    Diabetes Father    Heart disease Father    COPD Mother 28       deceased   Alcohol abuse Sister        polysubstance abuse   COPD Sister    Lung cancer Sister    Alcohol abuse Sister        polysubstance abuse   Penile  cancer Brother    Diabetes Brother    Prostate cancer Neg Hx    Bladder Cancer Neg Hx    Kidney cancer Neg Hx    ROS: All systems reviewed and negative except as per HPI.   Current Outpatient Medications  Medication Sig Dispense Refill   albuterol  (VENTOLIN  HFA) 108 (90 Base) MCG/ACT inhaler TAKE 2 PUFFS BY MOUTH EVERY 6 HOURS AS NEEDED FOR WHEEZE OR SHORTNESS OF BREATH 18 each 2   ALPRAZolam  (XANAX ) 0.5 MG tablet Take 0.5 mg by mouth 3 (three) times daily.     aspirin  81 MG tablet Take 81 mg by mouth daily.     atorvastatin  (LIPITOR) 80 MG tablet Take 1 tablet (80 mg total) by mouth daily. 90 tablet 3   bisoprolol  (ZEBETA ) 5 MG tablet TAKE 1/2 TABLET BY MOUTH DAILY 45 tablet 2   budesonide  (PULMICORT ) 0.5 MG/2ML nebulizer solution Take 2 mLs (0.5 mg total) by nebulization in the morning and at bedtime. 360 mL 10   dapagliflozin  propanediol (FARXIGA ) 10 MG TABS tablet TAKE 1 TABLET BY MOUTH DAILY BEFORE BREAKFAST. 30 tablet 11   Ensifentrine  (OHTUVAYRE ) 3 MG/2.5ML SUSP Inhale 3 mLs into the lungs 2 (two) times daily. DX:J44.9 450 mL 2   ezetimibe  (ZETIA ) 10 MG tablet Take 1 tablet (10 mg total) by mouth daily. 90 tablet 3   famotidine  (PEPCID ) 20 MG tablet TAKE 1 TABLET (  20 MG TOTAL) BY MOUTH 2 (TWO) TIMES DAILY. FOR BREAKTHROUGH REFLUX 180 tablet 3   FLUoxetine  (PROZAC ) 20 MG tablet Take 20 mg by mouth daily.     fluticasone  (FLONASE ) 50 MCG/ACT nasal spray Place 2 sprays into both nostrils daily.     furosemide  (LASIX ) 20 MG tablet Take 1 tablet (20 mg total) by mouth daily. 30 tablet 0   ipratropium-albuterol  (DUONEB) 0.5-2.5 (3) MG/3ML SOLN Take 3 mLs by nebulization every 6 (six) hours as needed. (Patient taking differently: Take 3 mLs by nebulization every 6 (six) hours.) 360 mL 11   ketoconazole  (NIZORAL ) 2 % cream Apply 1 Application topically daily.     Mepolizumab  (NUCALA ) 100 MG/ML SOAJ Inject 1 mL (100 mg total) into the skin every 28 (twenty-eight) days. 1 mL 3    methylPREDNISolone  (MEDROL  DOSEPAK) 4 MG TBPK tablet Take as directed in the package,this is a taper pack. 21 tablet 0   mirtazapine  (REMERON ) 45 MG tablet Take 45 mg by mouth at bedtime.     Multiple Vitamin (MULTIVITAMIN) capsule Take 1 capsule by mouth daily.     nitroGLYCERIN  (NITROSTAT ) 0.4 MG SL tablet Place 1 tablet (0.4 mg total) under the tongue every 5 (five) minutes as needed. 25 tablet 3   OXYGEN  Inhale 3 L into the lungs daily.     pantoprazole  (PROTONIX ) 40 MG tablet Take 40 mg by mouth 2 (two) times daily before a meal.     sacubitril -valsartan  (ENTRESTO ) 49-51 MG Take 1 tablet by mouth 2 (two) times daily.     Spacer/Aero-Holding Chambers (AEROCHAMBER MV) inhaler Use as instructed 1 each 2   spironolactone  (ALDACTONE ) 25 MG tablet Take 0.5 tablets (12.5 mg total) by mouth daily. 90 tablet 3   No current facility-administered medications for this visit.   Vitals:   09/28/24 1132  BP: 102/64  Pulse: 89  SpO2: 91%  Weight: 174 lb 6.4 oz (79.1 kg)   Wt Readings from Last 3 Encounters:  09/28/24 174 lb 6.4 oz (79.1 kg)  09/16/24 179 lb 6.4 oz (81.4 kg)  09/13/24 180 lb 3.2 oz (81.7 kg)   Lab Results  Component Value Date   CREATININE 1.00 09/18/2024   CREATININE 1.06 09/16/2024   CREATININE 0.95 09/06/2024     Physical Exam:  General: Well appearing.  Cor: No JVD. Regular rhythm, rate.  Lungs: sporadic expiratory wheezing throughout, diminished in bilateral lower lobes Abdomen: soft, nontender, nondistended. Extremities: trace pedal edema Neuro:. Affect pleasant   Assessment/Plan: 1. Chronic systolic CHF: Ischemic cardiomyopathy.  Historically, EF has been in the 40-45% range, but echo in 9/23 showed EF 50-55% (technically difficult study).  LV-gram with cath in 4/24 suggested that EF may be down to the 25-35% range.  He had no new coronary disease on cath in 4/24 to explain fall in EF.  Echo in 5/24 showed EF 35-40%, mild LV dilation, normal RV.  Echo in 4/25  showed EF up to 50-55%, RV normal, IVC normal.  NYHA class III, but think severe COPD plays the major role in symptoms currently. Minimally fluid up with edema although he reports improved symptoms since he's taken lasix  for last 2 days  - Take 2 more days of lasix  and then revert back to PRN usage - weight down 7 pounds from last visit 3 months ago - Continue spironolactone  12.5mg  daily.  - Continue Entresto  49/51 bid.   - Continue Farxiga  10 mg daily.  - Continue beta-1 selective bisoprolol  2.5 mg daily.   -  Wanted to get BMET but patient defers as he's going to PCP today and she does labs in her office 2. COPD: Severe, on 3 L home oxygen . Prior smoker.  As above, suspect COPD is currently the major contributor to his dyspnea.   - Saw pulmonology 09/25.  3.  CAD: Anterior MI in 2010 with DES to LAD and RCA.  Cath in 4/24 showed patent stents and mild nonobstructive CAD.  No recent exertional chest pain.   - Continue ASA 81 daily.  - Continue atorvastatin  80 mg daily, lipids ok in 3/25.     Return in 1 more for already f/u appt, sooner if needed.   I spent 39 minutes reviewing records, interviewing/ examing patient and managing plan/ orders.    Ellouise DELENA Class 09/27/2024

## 2024-09-28 ENCOUNTER — Ambulatory Visit: Attending: Family | Admitting: Family

## 2024-09-28 ENCOUNTER — Encounter: Payer: Self-pay | Admitting: Family

## 2024-09-28 VITALS — BP 102/64 | HR 89 | Wt 174.4 lb

## 2024-09-28 DIAGNOSIS — Z9221 Personal history of antineoplastic chemotherapy: Secondary | ICD-10-CM | POA: Diagnosis not present

## 2024-09-28 DIAGNOSIS — R0602 Shortness of breath: Secondary | ICD-10-CM | POA: Diagnosis present

## 2024-09-28 DIAGNOSIS — Z79899 Other long term (current) drug therapy: Secondary | ICD-10-CM | POA: Insufficient documentation

## 2024-09-28 DIAGNOSIS — Z8546 Personal history of malignant neoplasm of prostate: Secondary | ICD-10-CM | POA: Insufficient documentation

## 2024-09-28 DIAGNOSIS — Z87891 Personal history of nicotine dependence: Secondary | ICD-10-CM | POA: Diagnosis not present

## 2024-09-28 DIAGNOSIS — Z9981 Dependence on supplemental oxygen: Secondary | ICD-10-CM | POA: Diagnosis not present

## 2024-09-28 DIAGNOSIS — I5022 Chronic systolic (congestive) heart failure: Secondary | ICD-10-CM | POA: Insufficient documentation

## 2024-09-28 DIAGNOSIS — Z8501 Personal history of malignant neoplasm of esophagus: Secondary | ICD-10-CM | POA: Diagnosis not present

## 2024-09-28 DIAGNOSIS — I2510 Atherosclerotic heart disease of native coronary artery without angina pectoris: Secondary | ICD-10-CM | POA: Diagnosis not present

## 2024-09-28 DIAGNOSIS — J449 Chronic obstructive pulmonary disease, unspecified: Secondary | ICD-10-CM | POA: Insufficient documentation

## 2024-09-28 DIAGNOSIS — Z923 Personal history of irradiation: Secondary | ICD-10-CM | POA: Diagnosis not present

## 2024-09-28 DIAGNOSIS — I255 Ischemic cardiomyopathy: Secondary | ICD-10-CM | POA: Insufficient documentation

## 2024-09-28 DIAGNOSIS — Z7982 Long term (current) use of aspirin: Secondary | ICD-10-CM | POA: Diagnosis not present

## 2024-09-28 DIAGNOSIS — Z955 Presence of coronary angioplasty implant and graft: Secondary | ICD-10-CM | POA: Insufficient documentation

## 2024-09-28 DIAGNOSIS — I252 Old myocardial infarction: Secondary | ICD-10-CM | POA: Insufficient documentation

## 2024-09-28 DIAGNOSIS — Z7984 Long term (current) use of oral hypoglycemic drugs: Secondary | ICD-10-CM | POA: Diagnosis not present

## 2024-09-28 NOTE — Patient Instructions (Signed)
 It was good to see you today!  Take a couple more days of your lasix .

## 2024-09-28 NOTE — Progress Notes (Signed)
 Leslie Sims was counseled in complete detail on use of Nucala  prior to initial dose self-administered in clinic on 09/24/24. Tolerated well. Requested handout that he could share with daughters. Handout from manufacturer provided.   PLAN Continue Nucala  100mg  every 4 weeks. Next dose is due 10/22/24 and every 4 weeks thereafter. Rx sent to: [facility]: [phone] . Patient provided with pharmacy phone number.  Continue regimen:  Ohtuvayre  3mg /2.10mL susp (Inhale 3 mLs into the lungs 2 (two) times daily), Pulmicort  0.5mg /70mL neb soln (Take 2 mLs (0.5 mg total) by nebulization in the morning and at bedtime), DuoNeb 0.5-2.5 mg/71mL soln (Take 3 mLs by nebulization every 6 (six) hours as needed), Ventolin  108 mcg/act (Take 2 puffs by mouth every 6 hours PRN wheeze or SOB)  Follow-up with Dr. Tamea as planned on 11/06/24.    All questions encouraged and answered.  Instructed patient to reach out with any further questions or concerns.  Aleck Puls, PharmD, BCPS, CPP Clinical Pharmacist  [facility]

## 2024-10-10 ENCOUNTER — Emergency Department
Admission: EM | Admit: 2024-10-10 | Discharge: 2024-10-10 | Disposition: A | Discharge status: Home / Self Care | Attending: Emergency Medicine | Admitting: Emergency Medicine

## 2024-10-10 ENCOUNTER — Other Ambulatory Visit: Payer: Self-pay

## 2024-10-10 ENCOUNTER — Emergency Department

## 2024-10-10 ENCOUNTER — Encounter: Payer: Self-pay | Admitting: Intensive Care

## 2024-10-10 DIAGNOSIS — I509 Heart failure, unspecified: Secondary | ICD-10-CM | POA: Insufficient documentation

## 2024-10-10 DIAGNOSIS — Z8546 Personal history of malignant neoplasm of prostate: Secondary | ICD-10-CM | POA: Insufficient documentation

## 2024-10-10 DIAGNOSIS — I2510 Atherosclerotic heart disease of native coronary artery without angina pectoris: Secondary | ICD-10-CM | POA: Diagnosis not present

## 2024-10-10 DIAGNOSIS — R0602 Shortness of breath: Secondary | ICD-10-CM | POA: Diagnosis present

## 2024-10-10 DIAGNOSIS — J441 Chronic obstructive pulmonary disease with (acute) exacerbation: Secondary | ICD-10-CM | POA: Diagnosis not present

## 2024-10-10 DIAGNOSIS — E119 Type 2 diabetes mellitus without complications: Secondary | ICD-10-CM | POA: Diagnosis not present

## 2024-10-10 DIAGNOSIS — R42 Dizziness and giddiness: Secondary | ICD-10-CM | POA: Diagnosis not present

## 2024-10-10 LAB — CBC
HCT: 43.7 % (ref 39.0–52.0)
Hemoglobin: 13.1 g/dL (ref 13.0–17.0)
MCH: 29.3 pg (ref 26.0–34.0)
MCHC: 30 g/dL (ref 30.0–36.0)
MCV: 97.8 fL (ref 80.0–100.0)
Platelets: 209 K/uL (ref 150–400)
RBC: 4.47 MIL/uL (ref 4.22–5.81)
RDW: 13.6 % (ref 11.5–15.5)
WBC: 7.5 K/uL (ref 4.0–10.5)
nRBC: 0 % (ref 0.0–0.2)

## 2024-10-10 LAB — BASIC METABOLIC PANEL WITH GFR
Anion gap: 13 (ref 5–15)
BUN: 15 mg/dL (ref 8–23)
CO2: 29 mmol/L (ref 22–32)
Calcium: 8.8 mg/dL — ABNORMAL LOW (ref 8.9–10.3)
Chloride: 99 mmol/L (ref 98–111)
Creatinine, Ser: 0.9 mg/dL (ref 0.61–1.24)
GFR, Estimated: >60 mL/min (ref >60)
Glucose, Bld: 179 mg/dL — ABNORMAL HIGH (ref 70–99)
Potassium: 4 mmol/L (ref 3.5–5.1)
Sodium: 141 mmol/L (ref 135–145)

## 2024-10-10 LAB — TROPONIN I (HIGH SENSITIVITY): Troponin I (High Sensitivity): 8 ng/L (ref <18)

## 2024-10-10 MED ORDER — IOHEXOL 350 MG/ML SOLN
75.0000 mL | Freq: Once | INTRAVENOUS | Status: AC | PRN
  Administered 2024-10-10: 75 mL via INTRAVENOUS

## 2024-10-10 MED ORDER — PREDNISONE 20 MG PO TABS
60.0000 mg | ORAL_TABLET | Freq: Once | ORAL | Status: AC
  Administered 2024-10-10: 60 mg via ORAL
  Filled 2024-10-10: qty 3

## 2024-10-10 MED ORDER — PREDNISONE 10 MG PO TABS: ORAL_TABLET | ORAL | 0 refills | Status: AC

## 2024-10-10 MED ORDER — IPRATROPIUM-ALBUTEROL 0.5-2.5 (3) MG/3ML IN SOLN
3.0000 mL | Freq: Once | RESPIRATORY_TRACT | Status: AC
Start: 2024-10-10 — End: 2024-10-10
  Administered 2024-10-10: 3 mL via RESPIRATORY_TRACT
  Filled 2024-10-10: qty 3

## 2024-10-10 MED ORDER — IPRATROPIUM-ALBUTEROL 0.5-2.5 (3) MG/3ML IN SOLN
3.0000 mL | Freq: Once | RESPIRATORY_TRACT | Status: AC
  Administered 2024-10-10: 3 mL via RESPIRATORY_TRACT
  Filled 2024-10-10: qty 3

## 2024-10-10 MED ORDER — LACTATED RINGERS IV BOLUS
1000.0000 mL | Freq: Once | INTRAVENOUS | Status: AC
  Administered 2024-10-10: 1000 mL via INTRAVENOUS

## 2024-10-10 NOTE — ED Notes (Signed)
 Pt ambulatory to restroom

## 2024-10-10 NOTE — ED Triage Notes (Signed)
 C/o headaches, lightheadedness, burning in chest and sob.   Wears 3L O2 chronically    Reports getting the Nucala  shot 09/24/24 for the first time and believes he is reacting from it

## 2024-10-10 NOTE — ED Provider Notes (Signed)
 [facility] Provider Note    Event Date/Time   First MD Initiated Contact with Patient 10/10/24 1129     (approximate)   History   Chief Complaint Shortness of Breath   HPI  Leslie Sims is a 73 y.o. male with past medical history of diabetes, CAD, CHF, COPD, chronic hypoxic respiratory failure on 3 L, and prostate cancer who presents to the ED complaining of shortness of breath.  Patient reports that he was started on Nucala  for his COPD earlier this month and a few days after starting the medication developed burning discomfort in the left side of his chest along with increasing difficulty breathing.  He states he had some swelling in his legs last week that resolved with increased dose of diuretic.  He denies any cough and has not had any fever, has been using his nebulizer at home with partial relief.     Physical Exam   Triage Vital Signs: ED Triage Vitals  Encounter Vitals Group     BP 10/10/24 0952 (!) 108/58     Girls Systolic BP Percentile --      Girls Diastolic BP Percentile --      Boys Systolic BP Percentile --      Boys Diastolic BP Percentile --      Pulse Rate 10/10/24 0952 75     Resp 10/10/24 0952 (!) 22     Temp 10/10/24 0952 98.2 F (36.8 C)     Temp Source 10/10/24 0952 Oral     SpO2 10/10/24 0952 97 %     Weight 10/10/24 0954 172 lb (78 kg)     Height 10/10/24 0954 5' 10 (1.778 m)     Head Circumference --      Peak Flow --      Pain Score 10/10/24 0954 4     Pain Loc --      Pain Education --      Exclude from Growth Chart --     Most recent vital signs: Vitals:   10/10/24 1345 10/10/24 1403  BP:  110/72  Pulse: 65 70  Resp: 13 15  Temp:  98 F (36.7 C)  SpO2: 97% 100%    Constitutional: Alert and oriented. Eyes: Conjunctivae are normal. Head: Atraumatic. Nose: No congestion/rhinnorhea. Mouth/Throat: Mucous membranes are moist.  Cardiovascular: Normal rate, regular rhythm. Grossly normal heart  sounds.  2+ radial pulses bilaterally. Respiratory: Normal respiratory effort.  No retractions. Lungs with expiratory wheezing throughout. Gastrointestinal: Soft and nontender. No distention. Musculoskeletal: No lower extremity tenderness nor edema.  Neurologic:  Normal speech and language. No gross focal neurologic deficits are appreciated.    ED Results / Procedures / Treatments   Labs (all labs ordered are listed, but only abnormal results are displayed) Labs Reviewed  BASIC METABOLIC PANEL WITH GFR - Abnormal; Notable for the following components:      Result Value   Glucose, Bld 179 (*)    Calcium  8.8 (*)    All other components within normal limits  CBC  TROPONIN I (HIGH SENSITIVITY)     EKG  ED ECG REPORT I, Carlin Palin, the attending physician, personally viewed and interpreted this ECG.   Date: 10/10/2024  EKG Time: 9:59  Rate: 66  Rhythm: normal sinus rhythm  Axis: Normal  Intervals:none  ST&T Change: None  RADIOLOGY Chest x-ray reviewed and interpreted by me with no infiltrate, Dema, or effusion.  PROCEDURES:  Critical Care performed: No  Procedures  MEDICATIONS ORDERED IN ED: Medications  lactated ringers  bolus 1,000 mL (0 mLs Intravenous Stopped 10/10/24 1440)  ipratropium-albuterol  (DUONEB) 0.5-2.5 (3) MG/3ML nebulizer solution 3 mL (3 mLs Nebulization Given 10/10/24 1156)  predniSONE  (DELTASONE ) tablet 60 mg (60 mg Oral Given 10/10/24 1153)  iohexol  (OMNIPAQUE ) 350 MG/ML injection 75 mL (75 mLs Intravenous Contrast Given 10/10/24 1311)  ipratropium-albuterol  (DUONEB) 0.5-2.5 (3) MG/3ML nebulizer solution 3 mL (3 mLs Nebulization Given 10/10/24 1447)     IMPRESSION / MDM / ASSESSMENT AND PLAN / ED COURSE  I reviewed the triage vital signs and the nursing notes.                              73 y.o. male with past medical history of diabetes, CAD, CHF, COPD on 3 L, and prostate cancer who presents to the ED complaining of burning chest  discomfort and increasing difficulty breathing over the past 2 weeks.  Patient's presentation is most consistent with acute presentation with potential threat to life or bodily function.  Differential diagnosis includes, but is not limited to, ACS, PE, pneumonia, CHF exacerbation, COPD exacerbation, anemia, electrolyte abnormality, AKI, medication effect.  Patient nontoxic-appearing and in no acute distress, vital signs remarkable for borderline hypotension and mild tachypnea but otherwise reassuring.  He is maintaining oxygen  saturations on his usual 3 L nasal cannula, does have moderate amount of wheezing on exam.  We will treat with prednisone  and DuoNeb, but also assess for PE with CTA chest given known metastatic prostate cancer with no chest discomfort.  It does appear that Nucala  has the potential to cause chest discomfort and increasing difficulty breathing.  Labs are reassuring with no significant anemia, leukocytosis, electrolyte abnormality, or AKI.  Troponin within normal limits and I doubt ACS.  CTA chest negative for PE or other acute finding, does note pulmonary nodule that patient may follow-up with his pulmonologist.  Wheezing improved on reassessment and patient reports lightheadedness also improved following IV fluid bolus.  He was given additional DuoNeb with ongoing improvement in wheezing, states breathing is essentially back to baseline.  Ongoing lightheadedness may be due to his Nucala  and he was counseled to discuss this with his pulmonologist prior to administering another dose.  We will prescribe additional steroid taper and he was counseled to use nebulizer as needed.  He was counseled to return to the ED for new or worsening symptoms, patient agrees with plan.      FINAL CLINICAL IMPRESSION(S) / ED DIAGNOSES   Final diagnoses:  COPD exacerbation (HCC)  Lightheadedness     Rx / DC Orders   ED Discharge Orders          Ordered    predniSONE  (DELTASONE ) 10 MG  tablet  Daily        10/10/24 1458             Note:  This document was prepared using Dragon voice recognition software and may include unintentional dictation errors.   Willo Dunnings, MD 10/10/24 [phone]

## 2024-10-12 ENCOUNTER — Other Ambulatory Visit (HOSPITAL_COMMUNITY): Payer: Self-pay

## 2024-10-15 ENCOUNTER — Telehealth: Payer: Self-pay

## 2024-10-15 ENCOUNTER — Other Ambulatory Visit: Payer: Self-pay

## 2024-10-15 NOTE — Telephone Encounter (Signed)
 Skip next dose of Nucala  and resume next month.

## 2024-10-15 NOTE — Telephone Encounter (Signed)
 Copied from CRM [id number]. Topic: General - Other >> Oct 12, 2024  3:44 PM Corean SAUNDERS wrote: Reason for CRM:  Lauraine Pinal from [facility] is requesting a call back from Dr. Tamea or her nurse as she would like to discuss the following:   After patients appointment with Dr. Tamea last week patient had the injection of the new COPD medication and 4 days later received his covid and flu vaccine, Lauraine is concerned this could be an issue as his next injection is on 11/3   Please call Lauraine back [phone]

## 2024-10-15 NOTE — Telephone Encounter (Signed)
 Patient advised. NFN.

## 2024-10-19 ENCOUNTER — Other Ambulatory Visit: Payer: Self-pay

## 2024-10-23 ENCOUNTER — Encounter: Payer: Self-pay | Admitting: Physician Assistant

## 2024-10-23 ENCOUNTER — Ambulatory Visit: Attending: Physician Assistant | Admitting: Physician Assistant

## 2024-10-23 ENCOUNTER — Telehealth: Payer: Self-pay

## 2024-10-23 VITALS — BP 100/50 | HR 82 | Ht 70.0 in | Wt 176.8 lb

## 2024-10-23 DIAGNOSIS — I10 Essential (primary) hypertension: Secondary | ICD-10-CM | POA: Diagnosis present

## 2024-10-23 DIAGNOSIS — I2510 Atherosclerotic heart disease of native coronary artery without angina pectoris: Secondary | ICD-10-CM | POA: Diagnosis present

## 2024-10-23 DIAGNOSIS — I255 Ischemic cardiomyopathy: Secondary | ICD-10-CM | POA: Diagnosis present

## 2024-10-23 DIAGNOSIS — Z79899 Other long term (current) drug therapy: Secondary | ICD-10-CM | POA: Insufficient documentation

## 2024-10-23 DIAGNOSIS — E785 Hyperlipidemia, unspecified: Secondary | ICD-10-CM | POA: Insufficient documentation

## 2024-10-23 DIAGNOSIS — R911 Solitary pulmonary nodule: Secondary | ICD-10-CM | POA: Insufficient documentation

## 2024-10-23 DIAGNOSIS — J9611 Chronic respiratory failure with hypoxia: Secondary | ICD-10-CM | POA: Diagnosis present

## 2024-10-23 DIAGNOSIS — J449 Chronic obstructive pulmonary disease, unspecified: Secondary | ICD-10-CM | POA: Insufficient documentation

## 2024-10-23 DIAGNOSIS — I5020 Unspecified systolic (congestive) heart failure: Secondary | ICD-10-CM | POA: Insufficient documentation

## 2024-10-23 MED ORDER — SPIRONOLACTONE 25 MG PO TABS: 12.5000 mg | ORAL_TABLET | Freq: Every day | ORAL | 3 refills | Status: AC

## 2024-10-23 MED ORDER — EZETIMIBE 10 MG PO TABS: 10.0000 mg | ORAL_TABLET | Freq: Every day | ORAL | 3 refills | Status: AC

## 2024-10-23 NOTE — Patient Instructions (Signed)
 Medication Instructions:  Your physician recommends that you continue on your current medications as directed. Please refer to the Current Medication list given to you today.   *If you need a refill on your cardiac medications before your next appointment, please call your pharmacy*  Lab Work: Your provider would like for you to have following labs drawn today BNP.   If you have labs (blood work) drawn today and your tests are completely normal, you will receive your results only by: MyChart Message (if you have MyChart) OR A paper copy in the mail If you have any lab test that is abnormal or we need to change your treatment, we will call you to review the results.  Follow-Up: At [facility], you and your health needs are our priority.  As part of our continuing mission to provide you with exceptional heart care, our providers are all part of one team.  This team includes your primary Cardiologist (physician) and Advanced Practice Providers or APPs (Physician Assistants and Nurse Practitioners) who all work together to provide you with the care you need, when you need it.  Your next appointment:   6 month(s)  Provider:   You may see [name], MD or [name], PA-C   We recommend signing up for the patient portal called MyChart.  Sign up information is provided on this After Visit Summary.  MyChart is used to connect with patients for Virtual Visits (Telemedicine).  Patients are able to view lab/test results, encounter notes, upcoming appointments, etc.  Non-urgent messages can be sent to your provider as well.   To learn more about what you can do with MyChart, go to [email or web address].

## 2024-10-23 NOTE — Progress Notes (Signed)
 Cardiology Office Note    Date:  10/23/2024   ID:  Leslie Sims, DOB 12-15-1951, MRN [id number]  PCP:  Claudene Rayfield HERO, MD  Cardiologist:  [name], MD  Electrophysiologist:  None   Chief Complaint: Follow-up  History of Present Illness:   Leslie Sims is a 73 y.o. 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  He was admitted with an anterior MI in 2010 with LHC showing an occluded LAD and 99% stenosis in the RCA.  He was treated with PCI/DES to the LAD and RCA at that time.  He underwent repeat LHC in 2017 which showed patent LAD and RCA stents with an EF of 35 to 40%.  Echo in 2019 showed an EF of 40 to 45%.  Echo from 2022 demonstrated an EF of 40 to 45%, global hypokinesis with severe hypokinesis of the periapical region, grade 2 diastolic dysfunction, normal RV systolic function and ventricular cavity size, and a mildly dilated left atrium.  Lexiscan  MPI in 07/2022 showed no significant ischemia with a large region of fixed defect in the mid to distal anterior wall and apical region, LVEF estimated at 55%, and was overall moderate risk given large fixed anterior wall defect.  CT attenuated corrected images showed calcification in the proximal LAD.  Echo from 08/2022 showed an EF of 50 to 55%, no regional wall motion abnormalities, grade 1 diastolic dysfunction, normal RV systolic function and ventricular cavity size, and no significant valvular abnormalities.  Over the years, he has had multiple ED visits and hospitalizations for COPD exacerbations.  Most recent cardiac cath from 03/2023 showed widely patent LAD and  RCA stents with minimal restenosis with mild nonobstructive disease overall.  EF was 30 to 35%.  He has subsequently been followed by the advanced heart failure service with optimization of GDMT.  Most recent echo from 03/2024 showed improvement in LV systolic with an EF of 50 to 55%, no regional wall motion abnormalities, grade 1 diastolic dysfunction, normal RV systolic function and ventricular cavity size, no significant valvular abnormality, and an estimated right atrial pressure of 3 mmHg.  He was admitted with COPD exacerbation and pneumonia in 04/2024 with progression of baseline dyspnea post pneumonia.  He was admitted in 08/2024 with progressive shortness of breath treated with IV Solu-Medrol  and Lasix .  BNP 110 at that point.  He was seen by the [facility] CHF clinic on 09/28/2024 increasing shortness of breath having taken as needed furosemide  for 2 days in a row with improvement in dyspnea and pedal edema.  It was advised for him to take 2 more days of Lasix  and then revert back to as needed dosing.  He was seen in the ED on 10/10/2024 with an increase in shortness of breath and lightheadedness.  High-sensitivity troponin negative.  Chest x-ray without acute cardiopulmonary finding.  CTA chest negative for PE with persistent nodular density in the superior segment of the left lower lobe unchanged from prior study.  He was treated for COPD exacerbation with DuoNebs with improvement in dyspnea and advised to take short prednisone  taper.  He was advised to discuss lightheadedness with pulmonology given her recent initiation of Nucala .  He comes in today and is without symptoms of angina.  He does feel like his dyspnea has been slightly worse after receiving Nucala  injection followed by influenza and COVID vaccines.  Since being evaluated by the heart failure clinic on 10/10, he has not needed to take as needed furosemide  3 times, including after his ER visit on 10/22 due to lower extremity swelling that  has since resolved.  His weight is down 16 pounds by our scale today when compared to his last clinic visit in our office in 09/2023.  He reports this is intentional.  Remains on supplemental oxygen  at 3 to 4 L which is his baseline.  Sleeps in a recliner at baseline for the past 7 years.  Monitors his sodium and fluid intake.  No falls, hematochezia, or melena.  No dizziness, presyncope, or syncope.   Labs independently reviewed: 09/2024 - Hgb 13.1, PLT 209, potassium 4.0, BUN 15, serum creatinine 0.9, albumin 3.5, AST/ALT normal, A1c 6.6 08/2024 - BNP 110 02/2024 - TC 147, TG 137, HDL 54, LDL 69 01/2023 - TSH normal  Past Medical History:  Diagnosis Date   Allergic rhinitis    Anxiety and depression    Back pain    Cancer (HCC)    prostate   Colon polyps 08/2012   colonoscopy; multiple colon polyps; repeat colonoscopy in one year.   COPD (chronic obstructive pulmonary disease) (HCC)    a. on home O2.   Coronary artery disease 11/2009   a. late presenting ant MI; b. LHC 100% pLAD s/p PCI/DES, 99% mRCA s/p PCI/DES, EF 35%; c. nuclear stress test 05/13: prior ant/inf infarcts w/o ischemia, EF 43%; d. LHC 02/14: widely patent stents with no other obs dz, EF 40%; e. LHC 11/17: LM nl, mLAD 10%, patent LAD stent, dLAD 20%, p-mRCA 10%, mRCA 40%, patent RCA stent, EF 35-45%    Diabetes (HCC)    Dysphagia    GERD (gastroesophageal reflux disease)    Headache(784.0)    Helicobacter pylori (H. pylori)    Hernia    inguinal   HFrEF (heart failure with reduced ejection fraction) (HCC)    a. 10/2016 LV gram: EF 35-45%; b. 06/2018 Echo: EF 40-45%. c. 01/2021 Echo: EF 40-45%   Hyperlipidemia    Hypertension    Insomnia    Myalgia    Peptic ulcer    Pulmonary nodules    Status post dilation of esophageal narrowing 2000   Thickening of esophagus    Tinea pedis    Wears dentures    partial upper    Past Surgical History:  Procedure Laterality Date    coronary stents     Admission  12/20/2012    COPD exacerbation.  [facility].   CARDIAC CATHETERIZATION     CARDIAC CATHETERIZATION  02/05/2013   [facility]   CARDIAC CATHETERIZATION  09/19/2013   [facility] : patent stents with no change in anatomy. EF: 40$   CARDIAC CATHETERIZATION Left 11/12/2016   Procedure: Left Heart Cath and Coronary Angiography;  Surgeon: Deatrice DELENA Cage, MD;  Location: [facility] INVASIVE CV LAB;  Service: Cardiovascular;  Laterality: Left;   COLONOSCOPY     COLONOSCOPY WITH PROPOFOL  N/A 05/18/2016   Procedure: COLONOSCOPY WITH PROPOFOL ;  Surgeon: Rogelia Copping, MD;  Location: [facility] ENDOSCOPY;  Service: Endoscopy;  Laterality: N/A;   COLONOSCOPY WITH PROPOFOL  N/A 03/19/2020   Procedure: COLONOSCOPY WITH PROPOFOL ;  Surgeon: Unk Corinn Skiff, MD;  Location: [facility] ENDOSCOPY;  Service: Gastroenterology;  Laterality: N/A;   CORONARY ANGIOPLASTY  WITH STENT PLACEMENT  09/19/2009   LAD 3.0 X23 mm Xience DES, RCA: 4.0 X 15 mm Xience DES   ELECTROMAGNETIC NAVIGATION BROCHOSCOPY N/A 10/27/2017   Procedure: ELECTROMAGNETIC NAVIGATION BRONCHOSCOPY;  Surgeon: Isaiah Scrivener, MD;  Location: [facility] ORS;  Service: Cardiopulmonary;  Laterality: N/A;   ESOPHAGEAL DILATION     ESOPHAGOGASTRODUODENOSCOPY  12/20/2006   ESOPHAGOGASTRODUODENOSCOPY  08/20/2012   ESOPHAGOGASTRODUODENOSCOPY (EGD) WITH PROPOFOL  N/A 04/19/2022   Procedure: ESOPHAGOGASTRODUODENOSCOPY (EGD) WITH PROPOFOL ;  Surgeon: Maryruth Ole DASEN, MD;  Location: [facility] ENDOSCOPY;  Service: Endoscopy;  Laterality: N/A;   ESOPHAGOGASTRODUODENOSCOPY (EGD) WITH PROPOFOL  N/A 04/27/2022   Procedure: ESOPHAGOGASTRODUODENOSCOPY (EGD) WITH PROPOFOL ;  Surgeon: Maryruth Ole DASEN, MD;  Location: [facility] ENDOSCOPY;  Service: Endoscopy;  Laterality: N/A;   ESOPHAGOGASTRODUODENOSCOPY (EGD) WITH PROPOFOL  N/A 11/08/2022   Procedure: ESOPHAGOGASTRODUODENOSCOPY (EGD) WITH PROPOFOL ;  Surgeon: Maryruth Ole DASEN, MD;  Location: [facility] ENDOSCOPY;  Service: Endoscopy;  Laterality: N/A;   EUS N/A 05/20/2022   Procedure:  UPPER ENDOSCOPIC ULTRASOUND (EUS) LINEAR;  Surgeon: Elta Fonda SQUIBB, MD;  Location: [facility] ENDOSCOPY;  Service: Gastroenterology;  Laterality: N/A;  [facility]   HERNIA REPAIR  07/20/2012   L inguinal hernia repair   PENILE PROSTHESIS IMPLANT     RIGHT/LEFT HEART CATH AND CORONARY ANGIOGRAPHY Bilateral 04/04/2023   Procedure: RIGHT/LEFT HEART CATH AND CORONARY ANGIOGRAPHY;  Surgeon: Darron Deatrice LABOR, MD;  Location: [facility] INVASIVE CV LAB;  Service: Cardiovascular;  Laterality: Bilateral;    Current Medications: Current Meds  Medication Sig   albuterol  (VENTOLIN  HFA) 108 (90 Base) MCG/ACT inhaler TAKE 2 PUFFS BY MOUTH EVERY 6 HOURS AS NEEDED FOR WHEEZE OR SHORTNESS OF BREATH   ALPRAZolam  (XANAX ) 0.5 MG tablet Take 0.5 mg by mouth 3 (three) times daily.   aspirin  81 MG tablet Take 81 mg by mouth daily.   atorvastatin  (LIPITOR) 80 MG tablet Take 1 tablet (80 mg total) by mouth daily.   bisoprolol  (ZEBETA ) 5 MG tablet TAKE 1/2 TABLET BY MOUTH DAILY   budesonide  (PULMICORT ) 0.5 MG/2ML nebulizer solution Take 2 mLs (0.5 mg total) by nebulization in the morning and at bedtime.   dapagliflozin  propanediol (FARXIGA ) 10 MG TABS tablet TAKE 1 TABLET BY MOUTH DAILY BEFORE BREAKFAST.   Ensifentrine  (OHTUVAYRE ) 3 MG/2.5ML SUSP Inhale 3 mLs into the lungs 2 (two) times daily. DX:J44.9   famotidine  (PEPCID ) 20 MG tablet TAKE 1 TABLET (20 MG TOTAL) BY MOUTH 2 (TWO) TIMES DAILY. FOR BREAKTHROUGH REFLUX   FLUoxetine  (PROZAC ) 20 MG tablet Take 20 mg by mouth daily.   fluticasone  (FLONASE ) 50 MCG/ACT nasal spray Place 2 sprays into both nostrils daily.   furosemide  (LASIX ) 20 MG tablet Take 1 tablet (20 mg total) by mouth daily. (Patient taking differently: Take 20 mg by mouth as needed for fluid.)   ipratropium-albuterol  (DUONEB) 0.5-2.5 (3) MG/3ML SOLN Take 3 mLs by nebulization every 6 (six) hours as needed. (Patient taking differently: Take 3 mLs by nebulization every 6 (six) hours.)   ketoconazole  (NIZORAL ) 2 %  cream Apply 1 Application topically daily.   Mepolizumab  (NUCALA ) 100 MG/ML SOAJ Inject 1 mL (100 mg total) into the skin every 28 (twenty-eight) days.   methylPREDNISolone  (MEDROL  DOSEPAK) 4 MG TBPK tablet Take as directed in the package,this is a taper pack.   mirtazapine  (REMERON ) 45 MG tablet Take 45 mg by mouth at bedtime.   Multiple Vitamin (MULTIVITAMIN) capsule Take 1 capsule by mouth daily.   nitroGLYCERIN  (NITROSTAT ) 0.4 MG SL tablet Place 1 tablet (0.4 mg total) under the tongue  every 5 (five) minutes as needed.   OXYGEN  Inhale 3 L into the lungs daily.   pantoprazole  (PROTONIX ) 40 MG tablet Take 40 mg by mouth 2 (two) times daily before a meal.   sacubitril -valsartan  (ENTRESTO ) 49-51 MG Take 1 tablet by mouth 2 (two) times daily.   Spacer/Aero-Holding Chambers (AEROCHAMBER MV) inhaler Use as instructed   [DISCONTINUED] ezetimibe  (ZETIA ) 10 MG tablet Take 1 tablet (10 mg total) by mouth daily.   [DISCONTINUED] spironolactone  (ALDACTONE ) 25 MG tablet Take 0.5 tablets (12.5 mg total) by mouth daily.    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  Vaping Use   Vaping status: Never Used  Substance and Sexual Activity   Alcohol use: No    Alcohol/week: 0.0 standard drinks of alcohol   Drug use: No   Sexual activity:  Yes  Other Topics Concern   Not on file  Social History Narrative   Marital status: Widowed. Lives alone.    Social Drivers of Corporate Investment Banker Strain: Low Risk  (10/11/2024)   Received from [facility]   Overall Financial Resource Strain (CARDIA)    Difficulty of Paying Living Expenses: Not hard at all  Food Insecurity: No Food Insecurity (10/11/2024)   Received from [facility]   Hunger Vital Sign    Within the past 12 months, you worried that your food would run out before you got the money to buy more.: Never true    Within the past 12 months, the food you bought just didn't last and you didn't have money to get more.: Never true  Transportation Needs: No Transportation Needs (10/11/2024)   Received from [facility] - Transportation    In the past 12 months, has lack of transportation kept you from medical appointments or from getting medications?: No    Lack of Transportation (Non-Medical): No  Physical Activity: Inactive (09/10/2024)   Received from [facility]   Exercise Vital Sign    On average, how many days per week do you engage in moderate to strenuous exercise (like a brisk walk)?: 0 days    On average, how many minutes do you engage in exercise at this level?: 0 min  Stress: No Stress Concern Present (09/10/2024)   Received from [facility] - Occupational Stress Questionnaire    Feeling of Stress : Not at all  Social Connections: Unknown (09/17/2024)   Social Connection and Isolation Panel    Frequency of Communication with Friends and Family: Patient declined    Frequency of Social Gatherings with Friends and Family: Patient declined    Attends  Religious Services: Not on file    Active Member of Clubs or Organizations: Not on file    Attends Club or Organization Meetings: Not on file    Marital Status: Not on file   Recent Concern: Social Connections - Moderately Isolated (09/10/2024)   Received from [facility]   Social Connection and Isolation Panel    In a typical week, how many times do you talk on the phone with family, friends, or neighbors?: Twice a week    How often do you get together with friends or relatives?: Once a week    How often do you attend church or religious services?: More than 4 times per year    Do you belong to any clubs or organizations such as church groups, unions, fraternal or athletic groups, or school groups?: No    How often do you attend meetings of the clubs or organizations you belong to?: Never    Are you married, widowed, divorced, separated, never married, or living with a partner?: Widowed     Family History:  The patient's family history includes Alcohol abuse in his sister and sister; COPD in his sister; COPD (age of onset: 30) in his mother; Coronary artery disease (age of onset: 11) in his father; Diabetes in his brother, brother, and father; Heart attack in his brother and father; Heart disease in his father; Hypertension in his brother; Lung cancer in his sister; Penile cancer in his brother. There is no history of Prostate cancer, Bladder Cancer, or Kidney cancer.  ROS:   12-point review of systems is negative unless otherwise noted in the HPI.   EKGs/Labs/Other Studies Reviewed:    Studies reviewed were summarized above. The additional studies were reviewed today:  2D echo 04/03/2024: 1. Left ventricular ejection fraction, by estimation, is 50 to 55%. The  left ventricle has low normal function. The left ventricle has no regional  wall motion abnormalities. Left ventricular diastolic parameters are  consistent with Grade I diastolic  dysfunction (impaired relaxation).   2. Right ventricular systolic function is normal. The right ventricular  size is normal. Tricuspid regurgitation signal is inadequate for assessing  PA pressure.   3.  The mitral valve is normal in structure. No evidence of mitral valve  regurgitation. No evidence of mitral stenosis.   4. The aortic valve is normal in structure. Aortic valve regurgitation is  not visualized. No aortic stenosis is present.   5. The inferior vena cava is normal in size with greater than 50%  respiratory variability, suggesting right atrial pressure of 3 mmHg.  __________  2D echo 05/02/2023: 1. Left ventricular ejection fraction, by estimation, is 35 to 40%. The  left ventricle has moderately decreased function. The left ventricle  demonstrates regional wall motion abnormalities (see scoring  diagram/findings for description). The left  ventricular internal cavity size was mildly dilated. There is mild left  ventricular hypertrophy. Left ventricular diastolic parameters are  consistent with Grade I diastolic dysfunction (impaired relaxation). There  is severe hypokinesis of the left  ventricular, mid-apical anterior wall, anterolateral wall and apical  segment.   2. Right ventricular systolic function is normal. The right ventricular  size is normal. Tricuspid regurgitation signal is inadequate for assessing  PA pressure.   3. Left atrial size was mildly dilated.   4. The mitral valve is normal in structure. No evidence of mitral valve  regurgitation. No evidence of mitral stenosis.   5. The aortic valve is normal in structure.  Aortic valve regurgitation is  not visualized. No aortic stenosis is present.   6. Suboptimal image quality.  __________  R/LHC/15/2024:   Prox RCA to Mid RCA lesion is 10% stenosed.   Prox RCA lesion is 30% stenosed.   Mid RCA lesion is 30% stenosed.   Prox LAD to Mid LAD lesion is 10% stenosed.   There is moderate to severe left ventricular systolic dysfunction.   LV end diastolic pressure is moderately elevated.   The left ventricular ejection fraction is 25-35% by visual estimate.   1.  Widely patent LAD and RCA stents with minimal  restenosis.  Mild nonobstructive disease overall.  Significant stenosis in the proximal and mid right coronary artery at the edges of the previously placed stent improved significantly with intracoronary nitroglycerin . 2.  Moderately to severely reduced LV systolic function with akinesis of mid to distal anterior and apical myocardium. 3.  Right heart catheterization showed normal RA pressure, mild pulmonary hypertension, mildly elevated wedge pressure and normal cardiac output.   Recommendations: Continue medical therapy for coronary artery disease. His cardiomyopathy seems to be worse than before with akinesis in the LAD distribution and spite of widely patent stent.  The patient has history of poor tolerance to heart failure medications and will refer to the advanced heart failure clinic. __________  2D echo 09/09/2022: 1. Challenging images   2. Left ventricular ejection fraction, by estimation, is 50 to 55%. The  left ventricle has low normal function. The left ventricle has no regional  wall motion abnormalities. Left ventricular diastolic parameters are  consistent with Grade I diastolic  dysfunction (impaired relaxation).   3. Right ventricular systolic function is normal. The right ventricular  size is normal.   4. The mitral valve is normal in structure. No evidence of mitral valve  regurgitation. No evidence of mitral stenosis.   5. The aortic valve was not well visualized. Aortic valve regurgitation  is not visualized. No aortic stenosis is present.  __________  Lexiscan  MPI 08/03/2022: Pharmacological myocardial perfusion imaging study with no significant  ischemia Large region fixed defect mid to distal anterior wall and apical region Hypokinesis of distal anterior wall, EF estimated at 55% No EKG changes concerning for ischemia at peak stress or in recovery. CT attenuation correction images with calcification in the proximal LAD Moderate risk scan given large fixed anterior  wall defect __________  2D echo 02/13/2021: 1. Left ventricular ejection fraction, by estimation, is 40 to 45%. The  left ventricle has mildly decreased function. The left ventricle  demonstrates global hypokinesis, severe hypokinesis of the periapical  region. Left ventricular diastolic parameters  are consistent with Grade II diastolic dysfunction (pseudonormalization).   2. Right ventricular systolic function is normal. The right ventricular  size is normal. Tricuspid regurgitation signal is inadequate for assessing  PA pressure.   3. Left atrial size was mildly dilated.  __________  2D echo 07/03/2018: - Procedure narrative: Transthoracic echocardiography. Image    quality was poor. The study was technically difficult, as a    result of poor acoustic windows and poor sound wave transmission.  - Left ventricle: The cavity size was normal. Wall thickness was    normal. Systolic function was mildly to moderately reduced. The    estimated ejection fraction was in the range of 40% to 45%. The    study is not technically sufficient to allow evaluation of LV    diastolic function.  - Pulmonary arteries: Systolic pressure could not be accurately  estimated.  __________  [facility] 11/08/2016: There is mild to moderate left ventricular systolic dysfunction. The left ventricular ejection fraction is 35-45% by visual estimate. LV end diastolic pressure is moderately elevated. Prox RCA to Mid RCA lesion, 10 %stenosed. Mid RCA lesion, 40 %stenosed. Mid LAD lesion, 10 %stenosed. Dist LAD lesion, 20 %stenosed.   1. Patent LAD and RCA stents with no evidence of obstructive coronary artery disease. 2. Mildly to moderately reduced LV systolic function with severe anterior and apical hypokinesis. Ejection fraction is 35-40%. 3. Moderately elevated left ventricular end-diastolic pressure.   Recommendations: Continue aggressive medical therapy. Consider treatment with a beta blocker if the patient  can tolerate and consider a small dose diuretic. __________  See CV Studies for more remote imaging   EKG:  EKG is ordered today.  The EKG ordered today demonstrates NSR, 82 bpm, baseline artifact, prior anterior MI, nonspecific lateral ST-T changes  Recent Labs: 09/06/2024: ALT 29 09/16/2024: B Natriuretic Peptide 110.5 10/10/2024: BUN 15; Creatinine, Ser 0.90; Hemoglobin 13.1; Platelets 209; Potassium 4.0; Sodium 141  Recent Lipid Panel    Component Value Date/Time   CHOL 147 03/19/2024 1518   TRIG 137 03/19/2024 1518   TRIG 65 01/15/2010 0000   HDL 54 03/19/2024 1518   CHOLHDL 2.7 03/19/2024 1518   CHOLHDL 2.8 07/07/2023 1123   VLDL 23 07/07/2023 1123   LDLCALC 69 03/19/2024 1518    PHYSICAL EXAM:    VS:  BP (!) 100/50 (BP Location: Left Arm, Patient Position: Sitting, Cuff Size: Normal)   Pulse 82 Comment: 76 oximeter  Ht 5' 10 (1.778 m)   Wt 176 lb 12.8 oz (80.2 kg)   SpO2 94%   BMI 25.37 kg/m   BMI: Body mass index is 25.37 kg/m.  Physical Exam Constitutional:      Appearance: He is well-developed.  HENT:     Head: Normocephalic and atraumatic.  Eyes:     General:        Right eye: No discharge.        Left eye: No discharge.  Cardiovascular:     Rate and Rhythm: Normal rate and regular rhythm.     Heart sounds: Normal heart sounds, S1 normal and S2 normal. Heart sounds not distant. No midsystolic click and no opening snap. No murmur heard.    No friction rub.  Pulmonary:     Effort: Pulmonary effort is normal. No respiratory distress.     Breath sounds: Normal breath sounds. No rhonchi or rales.     Comments: Diminished breath sounds throughout with faint bilateral expiratory wheezing. Musculoskeletal:     Cervical back: Normal range of motion.     Right lower leg: No edema.     Left lower leg: No edema.  Skin:    General: Skin is warm and dry.     Nails: There is no clubbing.  Neurological:     Mental Status: He is alert and oriented to person,  place, and time.  Psychiatric:        Speech: Speech normal.        Behavior: Behavior normal.        Thought Content: Thought content normal.        Judgment: Judgment normal.     Wt Readings from Last 3 Encounters:  10/23/24 176 lb 12.8 oz (80.2 kg)  10/10/24 172 lb (78 kg)  09/28/24 174 lb 6.4 oz (79.1 kg)     ASSESSMENT & PLAN:   CAD involving the native  coronary arteries without angina: Without symptoms of angina or cardiac decompensation.  Most recent cath showed patent stents with otherwise nonobstructive disease.  Remains on aspirin  81 mg, atorvastatin  80 mg, and ezetimibe  10 mg.  No indication for further ischemic testing at this time.  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  Most recent echo showed improvement in LV systolic function with low normal EF of 50 to 55%.  Remains on Entresto  49/51 mg twice daily, spironolactone  12.5 mg daily, Farxiga  10 mg, bisoprolol  2.5 mg, and furosemide  20 mg daily as needed.  Check BNP.  Follow-up with advanced heart failure.  Chronic hypoxic respiratory failure on supplemental oxygen  with severe COPD: Remains on 3 to 4 L supplemental oxygen .  Suspect this is the main driving force of his dyspnea.  Does not appear volume overloaded on exam, may be volume depleted.  Follow-up with pulmonology.  HTN: Blood pressure is well-controlled in the office today.  Continue pharmacotherapy as outlined above.  HLD: LDL 69 in 02/2024.  Remains on atorvastatin  80 mg and ezetimibe  10 mg.  Pulmonary nodule: Stable on imaging last month.  Followed by pulmonology.     Disposition: F/u with Dr. Darron or an APP in 6 months.   Medication Adjustments/Labs and Tests Ordered: Current medicines are reviewed at length with the patient today.  Concerns regarding medicines are outlined above. Medication changes, Labs and Tests ordered today are  summarized above and listed in the Patient Instructions accessible in Encounters.   Signed, [name], PA-C 10/23/2024 5:15 PM     [facility] [place] [address] [place], [place] [address] [phone]

## 2024-10-23 NOTE — Telephone Encounter (Signed)
 Received fax from [facility] requesting completion of medical necessity form to bill to part B. Filled out form and faxed to [facility] clinic for MD's signature. Will fax back to [facility] with chart notes once received back.

## 2024-10-24 LAB — BRAIN NATRIURETIC PEPTIDE: BNP: 32.7 pg/mL (ref 0.0–100.0)

## 2024-10-25 ENCOUNTER — Ambulatory Visit: Payer: Self-pay | Admitting: Physician Assistant

## 2024-10-26 ENCOUNTER — Telehealth: Payer: Self-pay | Admitting: Pulmonary Disease

## 2024-10-26 DIAGNOSIS — I5042 Chronic combined systolic (congestive) and diastolic (congestive) heart failure: Secondary | ICD-10-CM

## 2024-10-26 DIAGNOSIS — R0602 Shortness of breath: Secondary | ICD-10-CM

## 2024-10-26 DIAGNOSIS — J9611 Chronic respiratory failure with hypoxia: Secondary | ICD-10-CM

## 2024-10-26 DIAGNOSIS — J449 Chronic obstructive pulmonary disease, unspecified: Secondary | ICD-10-CM

## 2024-10-26 NOTE — Telephone Encounter (Signed)
 Copied from CRM [id number]. Topic: Clinical - Medication Refill >> Oct 26, 2024  9:20 AM Joesph PARAS wrote: Medication: Ensifentrine  (OHTUVAYRE ) 3 MG/2.5ML SUSP -  Patient also wants a rx of prednisone  for ongoing breathing issues. States they upped dose of depression medication and breathing got worse so patient wans prednisone  to get it back to baseline.   Has the patient contacted their pharmacy? Yes - Pharmacy states has been trying to contact us , chart says they have not.  This is the patient's preferred pharmacy:   [facility] - [place], [place] - [address] [place] [address] Phone: [phone] Fax: [phone] Is this the correct pharmacy for this prescription? Yes If no, delete pharmacy and type the correct one.   Has the prescription been filled recently? No  Is the patient out of the medication? No - Has three days left of it.   Has the patient been seen for an appointment in the last year OR does the patient have an upcoming appointment? Yes  Can we respond through MyChart? Yes  Agent: Please be advised that Rx refills may take up to 3 business days. We ask that you follow-up with your pharmacy.

## 2024-10-29 ENCOUNTER — Telehealth: Payer: Self-pay | Admitting: *Deleted

## 2024-10-29 DIAGNOSIS — J9611 Chronic respiratory failure with hypoxia: Secondary | ICD-10-CM

## 2024-10-29 DIAGNOSIS — J449 Chronic obstructive pulmonary disease, unspecified: Secondary | ICD-10-CM

## 2024-10-29 MED ORDER — OHTUVAYRE 3 MG/2.5ML IN SUSP: 3.0000 mL | Freq: Two times a day (BID) | RESPIRATORY_TRACT | 2 refills | Status: AC

## 2024-10-29 NOTE — Telephone Encounter (Signed)
 Rf sent NFn Copied from CRM [id number]. Topic: Clinical - Medication Refill >> Oct 26, 2024  9:20 AM Joesph PARAS wrote: Medication: Ensifentrine  (OHTUVAYRE ) 3 MG/2.5ML SUSP  Has the patient contacted their pharmacy? Yes - Pharmacy states has been trying to contact us , chart says they have not.  This is the patient's preferred pharmacy:   [facility] - [place], [place] - [address] [place] [address] Phone: [phone] Fax: [phone] Is this the correct pharmacy for this prescription? Yes If no, delete pharmacy and type the correct one.   Has the prescription been filled recently? No  Is the patient out of the medication? No - Has three days left of it.   Has the patient been seen for an appointment in the last year OR does the patient have an upcoming appointment? Yes  Can we respond through MyChart? Yes  Agent: Please be advised that Rx refills may take up to 3 business days. We ask that you follow-up with your pharmacy.

## 2024-10-29 NOTE — Telephone Encounter (Signed)
NA. LMTCB 

## 2024-10-29 NOTE — Telephone Encounter (Signed)
 Copied from CRM [id number]. Topic: Clinical - Medication Refill >> Oct 26, 2024  9:20 AM Leslie Sims wrote: Medication: Ensifentrine  (OHTUVAYRE ) 3 MG/2.5ML SUSP  Has the patient contacted their pharmacy? Yes - Pharmacy states has been trying to contact us , chart says they have not.  This is the patient's preferred pharmacy:   [facility] - [place], [place] - [address] [place] [address] Phone: [phone] Fax: [phone] Is this the correct pharmacy for this prescription? Yes If no, delete pharmacy and type the correct one.   Has the prescription been filled recently? No  Is the patient out of the medication? No - Has three days left of it.   Has the patient been seen for an appointment in the last year OR does the patient have an upcoming appointment? Yes  Can we respond through MyChart? Yes  Agent: Please be advised that Rx refills may take up to 3 business days. We ask that you follow-up with your pharmacy.

## 2024-10-30 NOTE — Telephone Encounter (Signed)
 I spoke with the patient. He said he was told there is a form he needs to get filled out for the [facility].  Pharmacy team to you know of a form that the pharmacy would be waiting on?

## 2024-10-30 NOTE — Telephone Encounter (Signed)
 Is he taking his Nucala  as well?  If the shortness of breath came after a change on the pressure medication this may be a side effect of the medication and prednisone  is not going to help with that.  He should discuss with whoever prescribed the change to decreased to where it was before to see if this changes his shortness of breath before we prescribe prednisone  which can be detrimental in the long run.

## 2024-10-30 NOTE — Telephone Encounter (Signed)
 Copied from CRM [id number]. Topic: Clinical - Medication Refill >> Oct 26, 2024  9:20 AM Joesph PARAS wrote:  Patient also wants a rx of prednisone  for ongoing breathing issues. States they upped dose of depression medication and breathing got worse so patient wans prednisone  to get it back to baseline.       I spoke with the patient. He is asking for a prescription of Prednisone  to be sent in to [facility]. How long have your symptoms been going on for? 1 month Any fevers, chills or sweats? No  Little cough Any SOB? Normal SOB Any wheezing? Wheezing Do you monitor your oxygen  at home? Running from 92-94 Do you wear O2? Yes  Albuterol - BID Pulmicort -BID Ohtuvayre - BID Duoneb- BID  Not on Prednisone  right now.

## 2024-10-31 ENCOUNTER — Other Ambulatory Visit
Admission: RE | Admit: 2024-10-31 | Discharge: 2024-10-31 | Disposition: A | Discharge status: Home / Self Care | Source: Home / Self Care | Attending: Pulmonary Disease | Admitting: Pulmonary Disease

## 2024-10-31 ENCOUNTER — Ambulatory Visit
Admission: RE | Admit: 2024-10-31 | Discharge: 2024-10-31 | Disposition: A | Discharge status: Home / Self Care | Attending: Pulmonary Disease | Admitting: Pulmonary Disease

## 2024-10-31 ENCOUNTER — Ambulatory Visit
Admission: RE | Admit: 2024-10-31 | Discharge: 2024-10-31 | Disposition: A | Discharge status: Home / Self Care | Source: Ambulatory Visit | Attending: Pulmonary Disease | Admitting: Pulmonary Disease

## 2024-10-31 DIAGNOSIS — J9611 Chronic respiratory failure with hypoxia: Secondary | ICD-10-CM | POA: Insufficient documentation

## 2024-10-31 DIAGNOSIS — R0602 Shortness of breath: Secondary | ICD-10-CM | POA: Insufficient documentation

## 2024-10-31 DIAGNOSIS — J449 Chronic obstructive pulmonary disease, unspecified: Secondary | ICD-10-CM | POA: Insufficient documentation

## 2024-10-31 LAB — CBC WITH DIFFERENTIAL/PLATELET
Abs Immature Granulocytes: 0.04 K/uL (ref 0.00–0.07)
Basophils Absolute: 0 K/uL (ref 0.0–0.1)
Basophils Relative: 1 %
Eosinophils Absolute: 0 K/uL (ref 0.0–0.5)
Eosinophils Relative: 0 %
HCT: 43.7 % (ref 39.0–52.0)
Hemoglobin: 13 g/dL (ref 13.0–17.0)
Immature Granulocytes: 1 %
Lymphocytes Relative: 9 %
Lymphs Abs: 0.6 K/uL — ABNORMAL LOW (ref 0.7–4.0)
MCH: 29.3 pg (ref 26.0–34.0)
MCHC: 29.7 g/dL — ABNORMAL LOW (ref 30.0–36.0)
MCV: 98.6 fL (ref 80.0–100.0)
Monocytes Absolute: 0.6 K/uL (ref 0.1–1.0)
Monocytes Relative: 10 %
Neutro Abs: 4.7 K/uL (ref 1.7–7.7)
Neutrophils Relative %: 79 %
Platelets: 190 K/uL (ref 150–400)
RBC: 4.43 MIL/uL (ref 4.22–5.81)
RDW: 13.2 % (ref 11.5–15.5)
WBC: 6 K/uL (ref 4.0–10.5)
nRBC: 0 % (ref 0.0–0.2)

## 2024-10-31 LAB — COMPREHENSIVE METABOLIC PANEL WITH GFR
ALT: 29 U/L (ref 0–44)
AST: 23 U/L (ref 15–41)
Albumin: 3.9 g/dL (ref 3.5–5.0)
Alkaline Phosphatase: 67 U/L (ref 38–126)
Anion gap: 7 (ref 5–15)
BUN: 21 mg/dL (ref 8–23)
CO2: 35 mmol/L — ABNORMAL HIGH (ref 22–32)
Calcium: 9.1 mg/dL (ref 8.9–10.3)
Chloride: 104 mmol/L (ref 98–111)
Creatinine, Ser: 0.98 mg/dL (ref 0.61–1.24)
GFR, Estimated: >60 mL/min (ref >60)
Glucose, Bld: 193 mg/dL — ABNORMAL HIGH (ref 70–99)
Potassium: 4.1 mmol/L (ref 3.5–5.1)
Sodium: 146 mmol/L — ABNORMAL HIGH (ref 135–145)
Total Bilirubin: 0.2 mg/dL (ref 0.0–1.2)
Total Protein: 6.1 g/dL — ABNORMAL LOW (ref 6.5–8.1)

## 2024-10-31 LAB — D-DIMER, QUANTITATIVE: D-Dimer, Quant: 0.29 ug{FEU}/mL (ref 0.00–0.50)

## 2024-10-31 NOTE — Telephone Encounter (Signed)
 I spoke with the patient. He said he took one Nucala  injection and had a reaction to it. He was told to hold off on it until Dec (see message from 10/27). He said his symptoms started before they increased his depression medication so he does not think that has anything to do with it.   Please advise.

## 2024-10-31 NOTE — Telephone Encounter (Signed)
 Noted. Nothing further needed.

## 2024-10-31 NOTE — Telephone Encounter (Signed)
 I spoke to the patient.  I advised him that we need to know what we are treating before giving him steroids without making sure what is going on.  I have put in for some blood work chest x-ray to be done at the med center in [place].  Once I know the results we can determine what else he needs to have done i.e. possible CT angio scan etc.  He has follow-up appointment with us  on 18 November (Tuesday).

## 2024-10-31 NOTE — Telephone Encounter (Signed)
 Yes it was faxed to you all last week, look at Medication Management encounter on 11/4. It was placed in Dr. Evalyn folder. I can refax it again if need be.

## 2024-11-01 ENCOUNTER — Ambulatory Visit: Payer: Self-pay | Admitting: Pulmonary Disease

## 2024-11-01 ENCOUNTER — Other Ambulatory Visit: Payer: Self-pay | Admitting: Pulmonary Disease

## 2024-11-01 ENCOUNTER — Ambulatory Visit: Payer: Self-pay | Admitting: Family

## 2024-11-01 ENCOUNTER — Telehealth: Payer: Self-pay

## 2024-11-01 LAB — PRO BRAIN NATRIURETIC PEPTIDE: Pro Brain Natriuretic Peptide: 145 pg/mL (ref <300.0)

## 2024-11-01 MED ORDER — PREDNISONE 20 MG PO TABS: 20.0000 mg | ORAL_TABLET | Freq: Every day | ORAL | 0 refills | Status: DC

## 2024-11-01 NOTE — Telephone Encounter (Signed)
 Forms received. Have been sent to media tab for retention.

## 2024-11-01 NOTE — Telephone Encounter (Unsigned)
 Copied from CRM #[id number]. Topic: Clinical - Medication Question >> Nov 01, 2024  4:45 PM Devaughn RAMAN wrote: Reason for CRM: Pt is calling to f/u regarding his Ensifentrine  (OHTUVAYRE ) 3 MG/2.5ML SUSP medication. Pt stated he is nearly out of the medication and he would like to know what is going on.

## 2024-11-02 NOTE — Telephone Encounter (Signed)
 Messaged pharmacy team, to send documentation, to [facility].

## 2024-11-02 NOTE — Telephone Encounter (Signed)
 This was faxed to [facility] at [phone] at 9:52 this morning.

## 2024-11-02 NOTE — Telephone Encounter (Signed)
 Refaxed to correct fax number from CRM.

## 2024-11-02 NOTE — Telephone Encounter (Signed)
Form has been faxed. Nothing further needed.

## 2024-11-02 NOTE — Telephone Encounter (Signed)
 Copied from CRM [id number]. Topic: Clinical - Prescription Issue >> Nov 02, 2024  9:38 AM Devaughn RAMAN wrote: Reason for CRM: [place] with [facility] is calling because the pt's clinical charts are expired and they need new ones for his Ensifentrine  (OHTUVAYRE ) 3 MG/2.5ML SUSP medication.   [facility] Phone-[phone] Fax-[phone]

## 2024-11-02 NOTE — Telephone Encounter (Signed)
 Patient advised. NFN.

## 2024-11-02 NOTE — Telephone Encounter (Signed)
 This was done.

## 2024-11-05 ENCOUNTER — Telehealth: Payer: Self-pay | Admitting: Family

## 2024-11-05 ENCOUNTER — Other Ambulatory Visit: Payer: Self-pay

## 2024-11-05 DIAGNOSIS — R251 Tremor, unspecified: Secondary | ICD-10-CM

## 2024-11-05 DIAGNOSIS — H538 Other visual disturbances: Secondary | ICD-10-CM

## 2024-11-05 DIAGNOSIS — R42 Dizziness and giddiness: Secondary | ICD-10-CM

## 2024-11-05 NOTE — Progress Notes (Unsigned)
 PCP: Leslie Rayfield HERO, MD Cardiology: Dr. Darron Sims Cardiology: Dr. Rolan  Chief complaint: shortness of breath   HPI:  Ms Leslie Sims is a 73 y.o. male with a history of CAD, ischemic cardiomyopathy, and severe COPD on home oxygen  was referred by Dr. Darron for evaluation of heart failure. Patient had an anterior MI in 2010.  At that time, he had DES to LAD and to RCA.  Repeat caths in 2017 and in 4/24 have shown patent stents and minimal progression of CAD.  Echoes showed EF 40-45% generally, then echo in 9/23 showed EF up to 50-55% though study was technically difficult.  LV-gram with cath in 4/24 suggested lower EF at 25-35%. Patient was a heavy smoker in the past, quit in 2020.  He has severe COPD and wears 3L home oxygen . 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    Due to progressive dyspnea and atypical chest pain, he had RHC/LHC done in 4/24.  Results of angiography and LV-gram as above, RHC showed normal filling pressures and preserved cardiac output.  He went to the ER 04/24 with cough and worsening dyspnea, he was thought to have a COPD exacerbation and was given prednisone  and nebs.    Echo in 5/24 showed EF 35-40%, mild LV dilation, normal RV.    Echo in 4/25 showed EF up to 50-55%, RV normal, IVC normal.   Admitted 05/14/24 with worsening SOB over previous week. + productive cough, EMS had oxygen  sats of 80's on 3L. Given nebulizer and IV solu-medrol . Found to have COPD exacerbation along with sepsis due to pneumonia. Treated with antibiotics and symptoms improved.   Admitted 09/16/24 with shortness of breath and increased swelling of feet. No worsening orthopnea. BNP slightly elevated in ED at 110. CXR was nonacute. Given IV solu-medrol  and IV lasix  with improvement of symptoms.   Seen in [facility] 09/28/24 for worsening SOB where lasix  was given for 2 more days and then returned back to PRN.    Seen in ED 10/10/24 for an increase in shortness of breath and lightheadedness. High-sensitivity troponin negative. Chest x-ray without acute cardiopulmonary finding. CTA chest negative for PE with persistent nodular density in the superior segment of the left lower lobe unchanged from prior study. He was treated for COPD exacerbation with DuoNebs with improvement in dyspnea and advised to take short prednisone  taper. He was advised to discuss lightheadedness with pulmonology given her recent initiation of Nucala .   Seen by cardiology 10/23/24 with dyspnea which he feels worsened after recent Nucala  injection followed by flu / covid vaccine. Weight down 16 pounds Sleeps in recliner at baseline for the last 7 years. Appeared euvolemic possibly volume depleted. BNP drawn.   He presents today for a Sims follow-up visit with a chief complaint of shortness of breath. Has associated fatigue, constant dizziness, tremors left hand > right hand, blurry vision, little bit of pedal edema. Concerned that symptoms began after Nucala  injection. He is currently scheduled for a brain MRI tomorrow from his PCP due to his symptoms of tremors, dizziness and blurry vision. He says that the tremors even occur at rest or when he's holding something, like a coffee cup. Taking lasix  PRN and last took it yesterday due to swelling in his feet. Prior to yesterday, it had been several weeks since he had taken it.  Labs (2/24): LDL 47 Labs (4/24): hgb 13.3, K 4.1, creatinine 0.95 Labs (6/24): BNP 42, K 3.9, creatinine 0.93 Labs (7/24): LDL 58 Labs (2/25): K 4.6, creatinine 1.15 Labs (3/25): LDL 69 Labs (6/25): K 3.9, creatinine 1.05 Labs (7/25): K 3.9, creatinine 0.75, BNP 165.7 Labs (8/25): K 4.1, creatinine 0.75, Hg 12.5, HS troponin 5 Labs (9/25): K 3.6, creatinine 1.00 Labs (10/25): K 4, creatinine 0.9, HS-troponin 8, Hg  13.1 Labs (11/25): K 4.1, creatinine 0.98, LFT's normal, proBNP 145, Hg 13, D-dimer 0.29  PMH: 1. CAD: Anterior MI in 2010 with DES to LAD and RCA.  - LHC (2017): Patent stents.  - LHC (4/24): Mild nonobstructive CAD with patent stents.  2. Chronic systolic CHF: Ischemic cardiomyopathy.   - Echo (2019): EF 40-45% - Echo (2/22): EF 40-45% - Echo (9/23): Technically difficult, EF 50-55%.  - LV-gram (4/24): EF 25-35% - RHC (4/24): mean RA 6, PA 38/15, mean PCWP 15, CI 3.16 - Echo (5/24): EF 35-40%, mild LV dilation, normal RV. - Echo (4/25): EF 50-55%, RV normal, IVC normal. 3. COPD: On 3.5 L home oxygen , severe. Quit smoking 2020.  4. H/o PVCs 5. Esophageal squamous cell carcinoma: S/p chemo-radiation.  6. Prostate cancer: Radiation.   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  Vaping Use   Vaping status: Never Used  Substance and Sexual Activity   Alcohol use: No    Alcohol/week: 0.0 standard drinks of alcohol   Drug use: No   Sexual activity: Yes  Other Topics Concern   Not on file  Social History Narrative   Marital status: Widowed. Lives alone.    Social Drivers of Corporate Investment Banker Strain: Low Risk  (10/11/2024)   Received from [facility]   Overall Financial Resource Strain (CARDIA)    Difficulty of Paying Living Expenses: Not hard at all  Food Insecurity: No Food  Insecurity (10/11/2024)   Received from [facility]   Hunger Vital Sign    Within the past 12 months, you worried that your food would run out before you got the money to buy more.: Never true    Within the past 12 months, the food you bought just didn't last and you didn't have money to get more.: Never true  Transportation Needs: No Transportation Needs (10/11/2024)   Received from [facility] - Transportation    In the past 12 months, has lack of transportation kept you from medical appointments or from getting medications?: No    Lack of Transportation (Non-Medical): No  Physical Activity: Inactive (09/10/2024)   Received from [facility]   Exercise Vital Sign    On average, how many days per week do you engage in moderate to strenuous exercise (like  a brisk walk)?: 0 days    On average, how many minutes do you engage in exercise at this level?: 0 min  Stress: No Stress Concern Present (09/10/2024)   Received from [facility] - Occupational Stress Questionnaire    Feeling of Stress : Not at all  Social Connections: Unknown (09/17/2024)   Social Connection and Isolation Panel    Frequency of Communication with Friends and Family: Patient declined    Frequency of Social Gatherings with Friends and Family: Patient declined    Attends Religious Services: Not on Insurance Claims Handler of Clubs or Organizations: Not on file    Attends Banker Meetings: Not on file    Marital Status: Not on file  Recent Concern: Social Connections - Moderately Isolated (09/10/2024)   Received from [facility]   Social Connection and Isolation Panel    In a typical week, how many times do you talk on the phone with family, friends, or neighbors?: Twice a week    How often do you get together with friends or relatives?: Once a week    How often do you attend  church or religious services?: More than 4 times per year    Do you belong to any clubs or organizations such as church groups, unions, fraternal or athletic groups, or school groups?: No    How often do you attend meetings of the clubs or organizations you belong to?: Never    Are you married, widowed, divorced, separated, never married, or living with a partner?: Widowed  Intimate Partner Violence: Unknown (09/17/2024)   Humiliation, Afraid, Rape, and Kick questionnaire    Fear of Current or Ex-Partner: Patient declined    Emotionally Abused: Patient declined    Physically Abused: Not on file    Sexually Abused: Patient declined   Family History  Problem Relation Age of Onset   Heart attack Brother        Brother #1   Diabetes Brother    Hypertension Brother        #3   Coronary artery disease Father 24       deceased   Heart attack Father    Diabetes Father    Heart disease Father    COPD Mother 36       deceased   Alcohol abuse Sister        polysubstance abuse   COPD Sister    Lung cancer Sister    Alcohol abuse Sister        polysubstance abuse   Penile cancer Brother    Diabetes Brother    Prostate cancer Neg Hx    Bladder Cancer Neg Hx    Kidney cancer Neg Hx    ROS: All systems reviewed and negative except as per HPI.   Current Outpatient Medications  Medication Sig Dispense Refill   albuterol  (VENTOLIN  HFA) 108 (90 Base) MCG/ACT inhaler TAKE 2 PUFFS BY MOUTH EVERY 6 HOURS AS NEEDED FOR WHEEZE OR SHORTNESS OF BREATH 18 each 2   ALPRAZolam  (XANAX ) 0.5 MG tablet Take 0.5 mg by mouth 3 (three) times daily.     aspirin  81 MG tablet Take 81 mg by mouth daily.     atorvastatin  (LIPITOR) 80 MG tablet Take 1 tablet (80 mg total) by mouth daily. 90 tablet 3   bisoprolol  (ZEBETA ) 5 MG tablet TAKE 1/2 TABLET BY MOUTH DAILY 45 tablet 2   budesonide  (  PULMICORT ) 0.5 MG/2ML nebulizer solution Take 2 mLs (0.5 mg total) by nebulization in the morning and at bedtime. 360 mL 10    dapagliflozin  propanediol (FARXIGA ) 10 MG TABS tablet TAKE 1 TABLET BY MOUTH DAILY BEFORE BREAKFAST. 30 tablet 11   Ensifentrine  (OHTUVAYRE ) 3 MG/2.5ML SUSP Inhale 3 mLs into the lungs 2 (two) times daily. DX:J44.9 450 mL 2   ezetimibe  (ZETIA ) 10 MG tablet Take 1 tablet (10 mg total) by mouth daily. 90 tablet 3   famotidine  (PEPCID ) 20 MG tablet TAKE 1 TABLET (20 MG TOTAL) BY MOUTH 2 (TWO) TIMES DAILY. FOR BREAKTHROUGH REFLUX 180 tablet 3   FLUoxetine  (PROZAC ) 20 MG tablet Take 20 mg by mouth daily.     fluticasone  (FLONASE ) 50 MCG/ACT nasal spray Place 2 sprays into both nostrils daily.     furosemide  (LASIX ) 20 MG tablet Take 1 tablet (20 mg total) by mouth daily. (Patient taking differently: Take 20 mg by mouth as needed for fluid.) 30 tablet 0   ipratropium-albuterol  (DUONEB) 0.5-2.5 (3) MG/3ML SOLN Take 3 mLs by nebulization every 6 (six) hours as needed. (Patient taking differently: Take 3 mLs by nebulization every 6 (six) hours.) 360 mL 11   ketoconazole  (NIZORAL ) 2 % cream Apply 1 Application topically daily.     Mepolizumab  (NUCALA ) 100 MG/ML SOAJ Inject 1 mL (100 mg total) into the skin every 28 (twenty-eight) days. 1 mL 3   mirtazapine  (REMERON ) 45 MG tablet Take 45 mg by mouth at bedtime.     Multiple Vitamin (MULTIVITAMIN) capsule Take 1 capsule by mouth daily.     nitroGLYCERIN  (NITROSTAT ) 0.4 MG SL tablet Place 1 tablet (0.4 mg total) under the tongue every 5 (five) minutes as needed. 25 tablet 3   OXYGEN  Inhale 3 L into the lungs daily.     pantoprazole  (PROTONIX ) 40 MG tablet Take 40 mg by mouth 2 (two) times daily before a meal.     predniSONE  (DELTASONE ) 20 MG tablet Take 1 tablet (20 mg total) by mouth daily with breakfast for 5 days. 5 tablet 0   sacubitril -valsartan  (ENTRESTO ) 49-51 MG Take 1 tablet by mouth 2 (two) times daily.     Spacer/Aero-Holding Chambers (AEROCHAMBER MV) inhaler Use as instructed 1 each 2   spironolactone  (ALDACTONE ) 25 MG tablet Take 0.5 tablets (12.5  mg total) by mouth daily. 90 tablet 3   No current facility-administered medications for this visit.   Vitals:   11/06/24 1330  BP: 110/69  Pulse: 70  SpO2: 93%  Weight: 178 lb 6.4 oz (80.9 kg)  PF: (!) 3 L/min   Wt Readings from Last 3 Encounters:  11/06/24 177 lb 9.6 oz (80.6 kg)  11/06/24 178 lb 6.4 oz (80.9 kg)  10/23/24 176 lb 12.8 oz (80.2 kg)   Lab Results  Component Value Date   CREATININE 0.98 10/31/2024   CREATININE 0.90 10/10/2024   CREATININE 1.00 09/18/2024    Physical Exam:  General: Well appearing.  Cor: No JVD. Regular rhythm, rate.  Lungs: clear, diminished throughout all lung fields Abdomen: soft, nontender, nondistended. Extremities: trace pitting edema BLE. + tremors bilateral hands with L>R Neuro:. Affect pleasant   Assessment/Plan: 1. Chronic systolic CHF: Ischemic cardiomyopathy.  Historically, EF has been in the 40-45% range, but echo in 9/23 showed EF 50-55% (technically difficult study).  LV-gram with cath in 4/24 suggested that EF may be down to the 25-35% range.  He had no new coronary disease on cath in 4/24 to explain fall in EF.  Echo in 5/24 showed EF 35-40%, mild LV dilation, normal RV.  Echo in 4/25 showed EF up to 50-55%, RV normal, IVC normal.  NYHA class III, but think severe COPD plays the major role in symptoms currently. Minimally fluid up with pedal edema.  - Take 3 more days of lasix  and then revert back to PRN usage - weight up 4 pounds from last visit 4 months ago - Continue beta-1 selective bisoprolol  2.5 mg daily.   - Continue Farxiga  10 mg daily.  - Continue Entresto  49/51 bid.   - Continue spironolactone  12.5mg  daily.  - BMET 10/31/24 reviewed: sodium 146, potassium 4.1, creatinine 0.98, GFR >60 2. COPD: Severe, on 3 L home oxygen . Prior smoker.  As above, suspect COPD is currently the major contributor to his dyspnea.   - Saw pulmonology 09/25. Returns later today 3.  CAD: Anterior MI in 2010 with DES to LAD and RCA.  Cath  in 4/24 showed patent stents and mild nonobstructive CAD.  No recent exertional chest pain.   - Continue ASA 81 daily.  4: Hyperlipidemia- - Continue zetia  10mg  daily - Continue atorvastatin  80 mg daily, lipids ok in 3/25.   5: Hand tremors- - present for several days. Occurring both at rest and while holding a cup. Saw PCP who has ordered Brain MRI for tomorrow.    Return in 2 months, sooner if needed.   I spent 37 minutes reviewing records, interviewing/ examing patient and managing plan/ orders.   Leslie Sims Class 11/05/2024

## 2024-11-05 NOTE — Telephone Encounter (Signed)
 Called to confirm/remind patient of their appointment [facility] on 11/06/24.   Appointment:   [x] Confirmed  [] Left mess   [] No answer/No voice mail  [] VM Full/unable to leave message  [] Phone not in service  Patient reminded to bring all medications and/or complete list.  Confirmed patient has transportation. Gave directions, instructed to utilize valet parking.

## 2024-11-06 ENCOUNTER — Ambulatory Visit (INDEPENDENT_AMBULATORY_CARE_PROVIDER_SITE_OTHER): Admitting: Pulmonary Disease

## 2024-11-06 ENCOUNTER — Encounter: Payer: Self-pay | Admitting: Pulmonary Disease

## 2024-11-06 ENCOUNTER — Ambulatory Visit: Attending: Family | Admitting: Family

## 2024-11-06 ENCOUNTER — Encounter: Payer: Self-pay | Admitting: Family

## 2024-11-06 VITALS — BP 102/60 | HR 79 | Temp 97.6°F | Ht 70.0 in | Wt 177.6 lb

## 2024-11-06 VITALS — BP 110/69 | HR 70 | Wt 178.4 lb

## 2024-11-06 DIAGNOSIS — Z7982 Long term (current) use of aspirin: Secondary | ICD-10-CM | POA: Insufficient documentation

## 2024-11-06 DIAGNOSIS — J9611 Chronic respiratory failure with hypoxia: Secondary | ICD-10-CM

## 2024-11-06 DIAGNOSIS — E785 Hyperlipidemia, unspecified: Secondary | ICD-10-CM | POA: Insufficient documentation

## 2024-11-06 DIAGNOSIS — Z9981 Dependence on supplemental oxygen: Secondary | ICD-10-CM | POA: Insufficient documentation

## 2024-11-06 DIAGNOSIS — Z79899 Other long term (current) drug therapy: Secondary | ICD-10-CM | POA: Insufficient documentation

## 2024-11-06 DIAGNOSIS — I5022 Chronic systolic (congestive) heart failure: Secondary | ICD-10-CM | POA: Diagnosis present

## 2024-11-06 DIAGNOSIS — E782 Mixed hyperlipidemia: Secondary | ICD-10-CM

## 2024-11-06 DIAGNOSIS — Z7984 Long term (current) use of oral hypoglycemic drugs: Secondary | ICD-10-CM | POA: Diagnosis not present

## 2024-11-06 DIAGNOSIS — Z955 Presence of coronary angioplasty implant and graft: Secondary | ICD-10-CM | POA: Insufficient documentation

## 2024-11-06 DIAGNOSIS — I252 Old myocardial infarction: Secondary | ICD-10-CM | POA: Diagnosis not present

## 2024-11-06 DIAGNOSIS — M7989 Other specified soft tissue disorders: Secondary | ICD-10-CM | POA: Insufficient documentation

## 2024-11-06 DIAGNOSIS — Z87891 Personal history of nicotine dependence: Secondary | ICD-10-CM | POA: Diagnosis not present

## 2024-11-06 DIAGNOSIS — I5020 Unspecified systolic (congestive) heart failure: Secondary | ICD-10-CM

## 2024-11-06 DIAGNOSIS — J449 Chronic obstructive pulmonary disease, unspecified: Secondary | ICD-10-CM

## 2024-11-06 DIAGNOSIS — I255 Ischemic cardiomyopathy: Secondary | ICD-10-CM | POA: Insufficient documentation

## 2024-11-06 DIAGNOSIS — I2510 Atherosclerotic heart disease of native coronary artery without angina pectoris: Secondary | ICD-10-CM | POA: Diagnosis not present

## 2024-11-06 DIAGNOSIS — R42 Dizziness and giddiness: Secondary | ICD-10-CM

## 2024-11-06 DIAGNOSIS — G252 Other specified forms of tremor: Secondary | ICD-10-CM

## 2024-11-06 DIAGNOSIS — R0602 Shortness of breath: Secondary | ICD-10-CM

## 2024-11-06 DIAGNOSIS — R251 Tremor, unspecified: Secondary | ICD-10-CM | POA: Insufficient documentation

## 2024-11-06 DIAGNOSIS — F411 Generalized anxiety disorder: Secondary | ICD-10-CM | POA: Diagnosis not present

## 2024-11-06 MED ORDER — PREDNISONE 10 MG PO TABS: 10.0000 mg | ORAL_TABLET | Freq: Every day | ORAL | 1 refills | Status: DC

## 2024-11-06 NOTE — Patient Instructions (Addendum)
 VISIT SUMMARY:  Leslie Sims, a 73 year old male with severe COPD, chronic respiratory failure with hypoxia, and ischemic cardiomyopathy, visited us  today due to worsening shortness of breath. He also reported dizziness and lightheadedness, which he associated with recent vaccinations. He has not used his Ohtuvayre  nebulizer for about a week due to insurance issues and concerns about its impact on his depression. He is scheduled for an MRI of the brain tomorrow and has a follow-up eye appointment.  YOUR PLAN:  -CHRONIC OBSTRUCTIVE PULMONARY DISEASE (COPD) WITH CHRONIC RESPIRATORY FAILURE AND HYPOXIA: COPD is a chronic lung disease that makes it hard to breathe, and chronic respiratory failure with hypoxia means your body isn't getting enough oxygen . You experienced a worsening of your symptoms recently. We have prescribed 10 mg of prednisone  for a couple of weeks to help reduce inflammation in your lungs. We have also held off on using the Ohtuvayre  nebulizer due to insurance issues and its potential impact on your depression. An MRI of the brain is scheduled to rule out other causes of your symptoms.  -ISCHEMIC CARDIOMYOPATHY: Ischemic cardiomyopathy is a condition where your heart's ability to pump blood is decreased due to coronary artery disease. Recent evaluations showed no evidence of heart failure worsening, which is good news.  -DIZZINESS AND LIGHTHEADEDNESS: You have been experiencing dizziness and lightheadedness, which you thought might be related to recent vaccinations. However, these symptoms are not attributed to the vaccinations. An EMT evaluation showed normal heart and oxygen  levels. We suspect that your medications, including those for depression and your nebulizer, might be contributing to these symptoms. An MRI of the brain is scheduled to further investigate the cause. If no other cause is found, we will consider restarting Nucala , a medication that can help with your breathing  issues.  Will also get a Doppler of your carotid arteries that may explain the dizziness.  -DEPRESSIVE DISORDER: Your depression medication might be affecting your breathing. Additionally, the Ohtuvayre  nebulizer can worsen depression. We plan to adjust your depression medication back to the previous dosage and have held off on using the Ohtuvayre  nebulizer for now.  INSTRUCTIONS:  Please continue with the prescribed 10 mg prednisone  for a couple of weeks. Attend your scheduled MRI of the brain tomorrow and follow-up eye appointment. We will adjust your depression medication to the previous dosage. If no other cause for your dizziness is found, we will consider restarting Nucala . Please monitor your symptoms and contact us  if you experience any worsening or new symptoms.

## 2024-11-06 NOTE — Patient Instructions (Signed)
 If you receive a satisfaction survey regarding the [facility], please take the time to fill it out. This way we can continue to provide excellent care and make any changes that need to be made.   Medication Changes:  FOR THE NEXT 3 DAYS ONLY take Furosemide  1 tab daily as needed.   Follow-Up in: Please follow up with the [facility] in 2 months with Ellouise Class, FNP.   Thank you for choosing [facility].    At the [facility], you and your health needs are our priority. We have a designated team specialized in the treatment of Heart Failure. This Care Team includes your primary Heart Failure Specialized Cardiologist (physician), Advanced Practice Providers (APPs- Physician Assistants and Nurse Practitioners), and Pharmacist who all work together to provide you with the care you need, when you need it.   You may see any of the following providers on your designated Care Team at your next follow up:  Dr. Toribio Fuel Dr. Ezra Shuck Dr. Ria Commander Dr. Morene Brownie Ellouise Class, FNP Jaun Bash, RPH-CPP  Please be sure to bring in all your medications bottles to every appointment.   Need to Contact Us :  If you have any questions or concerns before your next appointment please send us  a message through [place] or call our office at [phone].    TO LEAVE A MESSAGE FOR THE NURSE SELECT OPTION 2, PLEASE LEAVE A MESSAGE INCLUDING: YOUR NAME DATE OF BIRTH CALL BACK NUMBER REASON FOR CALL**this is important as we prioritize the call backs  YOU WILL RECEIVE A CALL BACK THE SAME DAY AS LONG AS YOU CALL BEFORE 4:00 PM

## 2024-11-06 NOTE — Progress Notes (Unsigned)
 Subjective:    Patient ID: Leslie Sims, male    DOB: [birth year], 73 y.o.   MRN: [id number]  Patient Care Team: Claudene Rayfield HERO, MD as PCP - General (Family Medicine) Darron Deatrice LABOR, MD as PCP - Cardiology (Cardiology) Darron Deatrice LABOR, MD as Consulting Physician (Cardiology) Verdene Gills, RN as Registered Nurse Tamea Dedra CROME, MD as Consulting Physician (Pulmonary Disease) Lenn Aran, MD as Radiation Oncologist (Radiation Oncology) Maurie Rayfield BIRCH, RN as Oncology Nurse Navigator Babara Call, MD as Consulting Physician (Oncology) Edith Rogue, MD (Neurology) Babara Call, MD as Consulting Physician (Oncology)  Chief Complaint  Patient presents with  . COPD    Shortness of breath. Cough and wheezing.     BACKGROUND/INTERVAL:  HPI   Review of Systems A 10 point review of systems was performed and it is as noted above otherwise negative.   Patient Active Problem List   Diagnosis Date Noted  . HFrEF (heart failure with reduced ejection fraction) (HCC)   . Sepsis (HCC) 05/19/2024  . Diarrhea 05/15/2024  . Anxiety and depression 05/15/2024  . Sepsis due to pneumonia (HCC) 05/14/2024  . Chronic combined systolic and diastolic congestive heart failure (HCC) 04/04/2023  . 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 Anemia due to antineoplastic chemotherapy 06/26/2022  . Constipation 06/19/2022  . Encounter for antineoplastic chemotherapy 05/31/2022  . Weight loss 05/31/2022  . Dysphagia 05/31/2022  . Primary esophageal squamous cell carcinoma (HCC) 05/04/2022  . COPD exacerbation (HCC) 07/31/2021  . Prostate cancer (HCC) 10/11/2020  . Elevated PSA 07/28/2020  . Iron deficiency anemia 05/20/2020  . Goals of care, counseling/discussion   . Palliative care by specialist   . Multifocal pneumonia 01/28/2019  . 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 Generalized anxiety disorder 11/12/2018  . Moderate episode of recurrent major depressive disorder  (HCC) 11/12/2018  . Chronic respiratory failure with hypoxia (HCC) 09/29/2018  . Acute respiratory failure with hypoxia and hypercapnia (HCC) 09/13/2018  . Acute on chronic respiratory failure with hypoxia and hypercapnia (HCC) 08/24/2018  . Atypical chest pain 07/02/2018  . Grief reaction 01/13/2018  . COPD with hypoxia (HCC) 12/25/2017  . Unstable angina (HCC)   . Controlled type 2 diabetes mellitus without complication, without long-term current use of insulin  (HCC) 10/25/2016  . Benign neoplasm of ascending colon   . Benign neoplasm of descending colon   . Benign neoplasm of sigmoid colon   . History of colonic polyps   . Pulmonary nodules/lesions, multiple 01/05/2015  . Bruit of left carotid artery 11/04/2014  . Sleep disorder 07/18/2013  . Seasonal and perennial allergic rhinitis 05/29/2013  . Bradycardia 04/20/2011  . SMOKER 07/30/2010  . Coronary artery disease 04/23/2010  . Hyperlipidemia 03/24/2010  . DEPRESSION/ANXIETY 03/24/2010  . Chronic heart failure with preserved ejection fraction (HFpEF) (HCC) 03/24/2010  . COPD, very severe (HCC) 03/24/2010  . GERD (gastroesophageal reflux disease) 03/24/2010  . Essential hypertension 11/21/2009    Social History   Tobacco Use  . Smoking status: Former    Current packs/day: 0.00    Average packs/day: 3.0 packs/day for 42.0 years (126.0 ttl pk-yrs)    Types: Cigarettes    Start date: 02/10/1977    Quit date: 02/10/2019    Years since quitting: 5.7  . Smokeless tobacco: Never  . Tobacco comments:    25 months ago most smoked 4 packs a day .  Substance Use Topics  . Alcohol use: No    Alcohol/week: 0.0 standard drinks of alcohol  Allergies  Allergen Reactions  . Prozac  [Fluoxetine  Hcl] Shortness Of Breath  . Hydroxyzine    . Bupropion Other (See Comments)    Makes me feel funny Other reaction(s): Other (See Comments) Unknown/made me feel real funny Makes me feel funny Makes me feel funny Other reaction(s):  Other (See Comments) Unknown/made me feel real funny Makes me feel funny  . Effexor Xr [Venlafaxine Hcl Er] Other (See Comments)    Makes me feel funny    Current Meds  Medication Sig  . albuterol  (VENTOLIN  HFA) 108 (90 Base) MCG/ACT inhaler TAKE 2 PUFFS BY MOUTH EVERY 6 HOURS AS NEEDED FOR WHEEZE OR SHORTNESS OF BREATH  . ALPRAZolam  (XANAX ) 0.5 MG tablet Take 0.5 mg by mouth 3 (three) times daily.  . aspirin  81 MG tablet Take 81 mg by mouth daily.  . atorvastatin  (LIPITOR) 80 MG tablet Take 1 tablet (80 mg total) by mouth daily.  . bisoprolol  (ZEBETA ) 5 MG tablet TAKE 1/2 TABLET BY MOUTH DAILY  . budesonide  (PULMICORT ) 0.5 MG/2ML nebulizer solution Take 2 mLs (0.5 mg total) by nebulization in the morning and at bedtime.  . dapagliflozin  propanediol (FARXIGA ) 10 MG TABS tablet TAKE 1 TABLET BY MOUTH DAILY BEFORE BREAKFAST.  . Ensifentrine  (OHTUVAYRE ) 3 MG/2.5ML SUSP Inhale 3 mLs into the lungs 2 (two) times daily. DX:J44.9  . ezetimibe  (ZETIA ) 10 MG tablet Take 1 tablet (10 mg total) by mouth daily.  . famotidine  (PEPCID ) 20 MG tablet TAKE 1 TABLET (20 MG TOTAL) BY MOUTH 2 (TWO) TIMES DAILY. FOR BREAKTHROUGH REFLUX  . FLUoxetine  (PROZAC ) 20 MG tablet Take 20 mg by mouth daily.  . fluticasone  (FLONASE ) 50 MCG/ACT nasal spray Place 2 sprays into both nostrils daily.  . furosemide  (LASIX ) 20 MG tablet Take 1 tablet (20 mg total) by mouth daily. (Patient taking differently: Take 20 mg by mouth as needed for fluid.)  . ipratropium-albuterol  (DUONEB) 0.5-2.5 (3) MG/3ML SOLN Take 3 mLs by nebulization every 6 (six) hours as needed.  . ketoconazole  (NIZORAL ) 2 % cream Apply 1 Application topically daily.  . Mepolizumab  (NUCALA ) 100 MG/ML SOAJ Inject 1 mL (100 mg total) into the skin every 28 (twenty-eight) days.  . mirtazapine  (REMERON ) 45 MG tablet Take 45 mg by mouth at bedtime.  . Multiple Vitamin (MULTIVITAMIN) capsule Take 1 capsule by mouth daily.  . nitroGLYCERIN  (NITROSTAT ) 0.4 MG SL  tablet Place 1 tablet (0.4 mg total) under the tongue every 5 (five) minutes as needed.  . OXYGEN  Inhale 3 L into the lungs daily.  . pantoprazole  (PROTONIX ) 40 MG tablet Take 40 mg by mouth 2 (two) times daily before a meal.  . sacubitril -valsartan  (ENTRESTO ) 49-51 MG Take 1 tablet by mouth 2 (two) times daily.  SABRA Spacer/Aero-Holding Chambers (AEROCHAMBER MV) inhaler Use as instructed  . spironolactone  (ALDACTONE ) 25 MG tablet Take 0.5 tablets (12.5 mg total) by mouth daily.    Immunization History  Administered Date(s) Administered  .  sv, Bivalent, Protein Subunit Rsvpref,pf [place]) 02/20/2023  . Fluad Quad(high Dose 65+) 11/16/2021, 11/05/2022  . INFLUENZA, HIGH DOSE SEASONAL PF 09/28/2019, 10/21/2023  . Influenza Whole 09/19/2012  . Influenza,inj,Quad PF,6+ Mos 10/24/2013, 11/25/2014, 03/02/2016, 10/27/2016, 10/05/2017  . Influenza-Unspecified 10/24/2013, 11/25/2014, 03/02/2016, 10/27/2016, 10/05/2017, 09/18/2018, 09/28/2019, 09/30/2020  . Moderna Covid-19 Fall Seasonal Vaccine 71yrs & older 11/05/2022, 03/29/2023, 10/21/2023, 03/30/2024  . Moderna Covid-19 Seasonal Vaccine 6 months thru 71years of age 36/17/2023  . Moderna Sars-Covid-2 Vaccination 01/26/2020, 02/23/2020  . PFIZER Comirnaty(Gray Top)Covid-19 Tri-Sucrose Vaccine 12/15/2020, 07/15/2021  . PFIZER(Purple  Top)SARS-COV-2 Vaccination 01/26/2020, 02/23/2020  . PNEUMOCOCCAL CONJUGATE-20 03/22/2022  . Pfizer Covid-19 Vaccine Bivalent Booster 64yrs & up 01/14/2022, 05/25/2022  . Pneumococcal Conjugate-13 11/25/2014  . Pneumococcal Polysaccharide-23 08/24/2016, 10/27/2016  . Pneumococcal-Unspecified 12/20/2008  . Tdap 12/20/2008        Objective:     BP 102/60   Pulse 79   Temp 97.6 F (36.4 C) (Temporal)   Ht 5' 10 (1.778 m)   Wt 177 lb 9.6 oz (80.6 kg)   SpO2 94% Comment: 3L oxygen   BMI 25.48 kg/m   SpO2: 94 % (3L oxygen )  GENERAL: HEAD: Normocephalic, atraumatic.  EYES: Pupils equal, round, reactive to  light.  No scleral icterus.  MOUTH:  NECK: Supple. No thyromegaly. Trachea midline. No JVD.  No adenopathy. PULMONARY: Good air entry bilaterally.  No adventitious sounds. CARDIOVASCULAR: S1 and S2. Regular rate and rhythm.  ABDOMEN: MUSCULOSKELETAL: No joint deformity, no clubbing, no edema.  NEUROLOGIC:  SKIN: Intact,warm,dry. PSYCH:  Recent Results (from the past 2160 hours)  CEA     Status: None   Collection Time: 09/06/24  1:24 PM  Result Value Ref Range   CEA 3.3 0.0 - 4.7 ng/mL    Comment: (NOTE)                             Nonsmokers          <3.9                             Smokers             <5.6 Roche [employer] Electrochemiluminescence Immunoassay (ECLIA) Values obtained with different assay methods or kits cannot be used interchangeably.  Results cannot be interpreted as absolute evidence of the presence or absence of malignant disease. Performed At: [facility] [address] [place], [place] [id number] Jennette Shorter MD Ey:[id number]   CBC with Differential (Cancer Center Only)     Status: Abnormal   Collection Time: 09/06/24  1:24 PM  Result Value Ref Range   WBC Count 5.2 4.0 - 10.5 K/uL   RBC 4.32 4.22 - 5.81 MIL/uL   Hemoglobin 12.5 (L) 13.0 - 17.0 g/dL   HCT 58.4 60.9 - 47.9 %   MCV 96.1 80.0 - 100.0 fL   MCH 28.9 26.0 - 34.0 pg   MCHC 30.1 30.0 - 36.0 g/dL   RDW 85.6 88.4 - 84.4 %   Platelet Count 227 150 - 400 K/uL   nRBC 0.0 0.0 - 0.2 %   Neutrophils Relative % 63 %   Neutro Abs 3.3 1.7 - 7.7 K/uL   Lymphocytes Relative 14 %   Lymphs Abs 0.7 0.7 - 4.0 K/uL   Monocytes Relative 16 %   Monocytes Absolute 0.8 0.1 - 1.0 K/uL   Eosinophils Relative 5 %   Eosinophils Absolute 0.2 0.0 - 0.5 K/uL   Basophils Relative 1 %   Basophils Absolute 0.0 0.0 - 0.1 K/uL   Immature Granulocytes 1 %   Abs Immature Granulocytes 0.03 0.00 - 0.07 K/uL    Comment: Performed at [facility], [address] [place], [place] [address]  PSA      Status: None   Collection Time: 09/06/24  1:26 PM  Result Value Ref Range   Prostatic Specific Antigen 0.48 0.00 - 4.00 ng/mL    Comment: (NOTE) While PSA levels of <=4.00 ng/ml are reported as  reference range, some men with levels below 4.00 ng/ml can have prostate cancer and many men with PSA above 4.00 ng/ml do not have prostate cancer.  Other tests such as free PSA, age specific reference ranges, PSA velocity and PSA doubling time may be helpful especially in men less than 73 years old. Performed at [facility], [address] [place], [place] [address]   CMP (Cancer Center only)     Status: None   Collection Time: 09/06/24  1:26 PM  Result Value Ref Range   Sodium 140 135 - 145 mmol/L   Potassium 3.5 3.5 - 5.1 mmol/L   Chloride 100 98 - 111 mmol/L   CO2 32 22 - 32 mmol/L   Glucose, Bld 88 70 - 99 mg/dL    Comment: Glucose reference range applies only to samples taken after fasting for at least 8 hours.   BUN 13 8 - 23 mg/dL   Creatinine 9.04 9.38 - 1.24 mg/dL   Calcium  8.9 8.9 - 10.3 mg/dL   Total Protein 6.6 6.5 - 8.1 g/dL   Albumin 3.7 3.5 - 5.0 g/dL   AST 28 15 - 41 U/L   ALT 29 0 - 44 U/L   Alkaline Phosphatase 67 38 - 126 U/L   Total Bilirubin 0.5 0.0 - 1.2 mg/dL   GFR, Estimated >39 >39 mL/min    Comment: (NOTE) Calculated using the CKD-EPI Creatinine Equation (2021)    Anion gap 8 5 - 15    Comment: Performed at [facility], [address] [place], [place] [address]  Nitric oxide      Status: None   Collection Time: 09/13/24  2:31 PM  Result Value Ref Range   Nitric Oxide  30   Basic metabolic panel     Status: Abnormal   Collection Time: 09/16/24  9:33 PM  Result Value Ref Range   Sodium 140 135 - 145 mmol/L   Potassium 3.9 3.5 - 5.1 mmol/L   Chloride 97 (L) 98 - 111 mmol/L   CO2 32 22 - 32 mmol/L   Glucose, Bld 137 (H) 70 - 99 mg/dL    Comment: Glucose reference range applies only to samples taken after fasting for at least 8 hours.    BUN 20 8 - 23 mg/dL   Creatinine, Ser 8.93 0.61 - 1.24 mg/dL   Calcium  9.0 8.9 - 10.3 mg/dL   GFR, Estimated >39 >39 mL/min    Comment: (NOTE) Calculated using the CKD-EPI Creatinine Equation (2021)    Anion gap 11 5 - 15    Comment: Performed at [facility], [address] [place], [place] [address]  CBC     Status: Abnormal   Collection Time: 09/16/24  9:33 PM  Result Value Ref Range   WBC 7.3 4.0 - 10.5 K/uL   RBC 4.40 4.22 - 5.81 MIL/uL   Hemoglobin 12.8 (L) 13.0 - 17.0 g/dL   HCT 58.6 60.9 - 47.9 %   MCV 93.9 80.0 - 100.0 fL   MCH 29.1 26.0 - 34.0 pg   MCHC 31.0 30.0 - 36.0 g/dL   RDW 85.8 88.4 - 84.4 %   Platelets 257 150 - 400 K/uL   nRBC 0.0 0.0 - 0.2 %    Comment: Performed at [facility], [address], [place], [place] [address]  Brain natriuretic peptide     Status: Abnormal   Collection Time: 09/16/24  9:33 PM  Result Value Ref Range   B Natriuretic Peptide  110.5 (H) 0.0 - 100.0 pg/mL    Comment: Performed at [facility], [address] [place], [place] [address]  Troponin I (High Sensitivity)     Status: None   Collection Time: 09/16/24  9:33 PM  Result Value Ref Range   Troponin I (High Sensitivity) 7 <18 ng/L    Comment: (NOTE) Elevated high sensitivity troponin I (hsTnI) values and significant  changes across serial measurements may suggest ACS but many other  chronic and acute conditions are known to elevate hsTnI results.  Refer to the Links section for chest pain algorithms and additional  guidance. Performed at [facility], [address] [place], [place] [address]   Resp panel by RT-PCR (RSV, Flu A&B, Covid) Anterior Nasal Swab     Status: None   Collection Time: 09/16/24  9:33 PM   Specimen: Anterior Nasal Swab  Result Value Ref Range   SARS Coronavirus 2 by RT PCR NEGATIVE NEGATIVE    Comment: (NOTE) SARS-CoV-2 target nucleic acids are NOT DETECTED.  The SARS-CoV-2 RNA is generally detectable in  upper respiratory specimens during the acute phase of infection. The lowest concentration of SARS-CoV-2 viral copies this assay can detect is 138 copies/mL. A negative result does not preclude SARS-Cov-2 infection and should not be used as the sole basis for treatment or other patient management decisions. A negative result may occur with  improper specimen collection/handling, submission of specimen other than nasopharyngeal swab, presence of viral mutation(s) within the areas targeted by this assay, and inadequate number of viral copies(<138 copies/mL). A negative result must be combined with clinical observations, patient history, and epidemiological information. The expected result is Negative.  Fact Sheet for Patients:  bloggercourse.com  Fact Sheet for Healthcare Providers:  seriousbroker.it  This test is no t yet approved or cleared by the [place]  FDA and  has been authorized for detection and/or diagnosis of SARS-CoV-2 by FDA under an Emergency Use Authorization (EUA). This EUA will remain  in effect (meaning this test can be used) for the duration of the COVID-19 declaration under Section 564(b)(1) of the Act, 21 U.S.C.section 360bbb-3(b)(1), unless the authorization is terminated  or revoked sooner.       Influenza A by PCR NEGATIVE NEGATIVE   Influenza B by PCR NEGATIVE NEGATIVE    Comment: (NOTE) The Xpert Xpress SARS-CoV-2/FLU/RSV plus assay is intended as an aid in the diagnosis of influenza from Nasopharyngeal swab specimens and should not be used as a sole basis for treatment. Nasal washings and aspirates are unacceptable for Xpert Xpress SARS-CoV-2/FLU/RSV testing.  Fact Sheet for Patients: bloggercourse.com  Fact Sheet for Healthcare Providers: seriousbroker.it  This test is not yet approved or cleared by the [place]  FDA and has been authorized  for detection and/or diagnosis of SARS-CoV-2 by FDA under an Emergency Use Authorization (EUA). This EUA will remain in effect (meaning this test can be used) for the duration of the COVID-19 declaration under Section 564(b)(1) of the Act, 21 U.S.C. section 360bbb-3(b)(1), unless the authorization is terminated or revoked.     Resp Syncytial Virus by PCR NEGATIVE NEGATIVE    Comment: (NOTE) Fact Sheet for Patients: bloggercourse.com  Fact Sheet for Healthcare Providers: seriousbroker.it  This test is not yet approved or cleared by the [place]  FDA and has been authorized for detection and/or diagnosis of SARS-CoV-2 by FDA under an Emergency Use Authorization (EUA). This EUA will remain in effect (meaning this test can be used) for the duration of the  COVID-19 declaration under Section 564(b)(1) of the Act, 21 U.S.C. section 360bbb-3(b)(1), unless the authorization is terminated or revoked.  Performed at [facility], [address] [place], [place] [address]   CBG monitoring, ED     Status: Abnormal   Collection Time: 09/17/24  1:48 AM  Result Value Ref Range   Glucose-Capillary 245 (H) 70 - 99 mg/dL    Comment: Glucose reference range applies only to samples taken after fasting for at least 8 hours.  CBG monitoring, ED     Status: Abnormal   Collection Time: 09/17/24  7:47 AM  Result Value Ref Range   Glucose-Capillary 124 (H) 70 - 99 mg/dL    Comment: Glucose reference range applies only to samples taken after fasting for at least 8 hours.  CBG monitoring, ED     Status: Abnormal   Collection Time: 09/17/24 12:03 PM  Result Value Ref Range   Glucose-Capillary 127 (H) 70 - 99 mg/dL    Comment: Glucose reference range applies only to samples taken after fasting for at least 8 hours.  Glucose, capillary     Status: Abnormal   Collection Time: 09/17/24  4:13 PM  Result Value Ref Range   Glucose-Capillary 126  (H) 70 - 99 mg/dL    Comment: Glucose reference range applies only to samples taken after fasting for at least 8 hours.  Glucose, capillary     Status: None   Collection Time: 09/17/24 10:16 PM  Result Value Ref Range   Glucose-Capillary 85 70 - 99 mg/dL    Comment: Glucose reference range applies only to samples taken after fasting for at least 8 hours.  Glucose, capillary     Status: Abnormal   Collection Time: 09/18/24  4:34 AM  Result Value Ref Range   Glucose-Capillary 138 (H) 70 - 99 mg/dL    Comment: Glucose reference range applies only to samples taken after fasting for at least 8 hours.  Glucose, capillary     Status: Abnormal   Collection Time: 09/18/24  7:30 AM  Result Value Ref Range   Glucose-Capillary 136 (H) 70 - 99 mg/dL    Comment: Glucose reference range applies only to samples taken after fasting for at least 8 hours.  Basic metabolic panel     Status: Abnormal   Collection Time: 09/18/24  9:22 AM  Result Value Ref Range   Sodium 141 135 - 145 mmol/L   Potassium 3.6 3.5 - 5.1 mmol/L   Chloride 95 (L) 98 - 111 mmol/L   CO2 33 (H) 22 - 32 mmol/L   Glucose, Bld 217 (H) 70 - 99 mg/dL    Comment: Glucose reference range applies only to samples taken after fasting for at least 8 hours.   BUN 27 (H) 8 - 23 mg/dL   Creatinine, Ser 8.99 0.61 - 1.24 mg/dL   Calcium  9.4 8.9 - 10.3 mg/dL   GFR, Estimated >39 >39 mL/min    Comment: (NOTE) Calculated using the CKD-EPI Creatinine Equation (2021)    Anion gap 13 5 - 15    Comment: Performed at [facility], [address] [place], [place] [address]  Basic metabolic panel     Status: Abnormal   Collection Time: 10/10/24  9:56 AM  Result Value Ref Range   Sodium 141 135 - 145 mmol/L   Potassium 4.0 3.5 - 5.1 mmol/L   Chloride 99 98 - 111 mmol/L   CO2 29 22 - 32 mmol/L   Glucose, Bld 179 (  H) 70 - 99 mg/dL    Comment: Glucose reference range applies only to samples taken after fasting for at least 8 hours.   BUN  15 8 - 23 mg/dL   Creatinine, Ser 9.09 0.61 - 1.24 mg/dL   Calcium  8.8 (L) 8.9 - 10.3 mg/dL   GFR, Estimated >39 >39 mL/min    Comment: (NOTE) Calculated using the CKD-EPI Creatinine Equation (2021)    Anion gap 13 5 - 15    Comment: Performed at [facility], [address] [place], [place] [address]  CBC     Status: None   Collection Time: 10/10/24  9:56 AM  Result Value Ref Range   WBC 7.5 4.0 - 10.5 K/uL   RBC 4.47 4.22 - 5.81 MIL/uL   Hemoglobin 13.1 13.0 - 17.0 g/dL   HCT 56.2 60.9 - 47.9 %   MCV 97.8 80.0 - 100.0 fL   MCH 29.3 26.0 - 34.0 pg   MCHC 30.0 30.0 - 36.0 g/dL   RDW 86.3 88.4 - 84.4 %   Platelets 209 150 - 400 K/uL   nRBC 0.0 0.0 - 0.2 %    Comment: Performed at [facility], [address], [place], [place] [address]  Troponin I (High Sensitivity)     Status: None   Collection Time: 10/10/24  9:56 AM  Result Value Ref Range   Troponin I (High Sensitivity) 8 <18 ng/L    Comment: (NOTE) Elevated high sensitivity troponin I (hsTnI) values and significant  changes across serial measurements may suggest ACS but many other  chronic and acute conditions are known to elevate hsTnI results.  Refer to the Links section for chest pain algorithms and additional  guidance. Performed at [facility], [address] [place], [place] [address]   Brain natriuretic peptide     Status: None   Collection Time: 10/23/24  4:01 PM  Result Value Ref Range   BNP 32.7 0.0 - 100.0 pg/mL    Comment: Siemens ADVIA Centaur XP methodology  D-dimer, quantitative     Status: None   Collection Time: 10/31/24  4:35 PM  Result Value Ref Range   D-Dimer, Quant 0.29 0.00 - 0.50 ug/mL-FEU    Comment: (NOTE) At the manufacturer cut-off value of 0.5 g/mL FEU, this assay has a negative predictive value of 95-100%.This assay is intended for use in conjunction with a clinical pretest probability (PTP) assessment model to exclude pulmonary embolism (PE) and  deep venous thrombosis (DVT) in outpatients suspected of PE or DVT. Results should be correlated with clinical presentation. Performed at [facility], [address] [place], [place] [address]   Comprehensive metabolic panel     Status: Abnormal   Collection Time: 10/31/24  4:35 PM  Result Value Ref Range   Sodium 146 (H) 135 - 145 mmol/L   Potassium 4.1 3.5 - 5.1 mmol/L   Chloride 104 98 - 111 mmol/L   CO2 35 (H) 22 - 32 mmol/L   Glucose, Bld 193 (H) 70 - 99 mg/dL    Comment: Glucose reference range applies only to samples taken after fasting for at least 8 hours.   BUN 21 8 - 23 mg/dL   Creatinine, Ser 9.01 0.61 - 1.24 mg/dL   Calcium  9.1 8.9 - 10.3 mg/dL   Total Protein 6.1 (L) 6.5 - 8.1 g/dL   Albumin 3.9 3.5 - 5.0 g/dL   AST 23 15 - 41 U/L   ALT 29 0 - 44 U/L  Alkaline Phosphatase 67 38 - 126 U/L   Total Bilirubin 0.2 0.0 - 1.2 mg/dL   GFR, Estimated >39 >39 mL/min    Comment: (NOTE) Calculated using the CKD-EPI Creatinine Equation (2021)    Anion gap 7 5 - 15    Comment: Performed at [facility], [address] [place], [place] [address]  CBC with Differential/Platelet     Status: Abnormal   Collection Time: 10/31/24  4:35 PM  Result Value Ref Range   WBC 6.0 4.0 - 10.5 K/uL   RBC 4.43 4.22 - 5.81 MIL/uL   Hemoglobin 13.0 13.0 - 17.0 g/dL   HCT 56.2 60.9 - 47.9 %   MCV 98.6 80.0 - 100.0 fL   MCH 29.3 26.0 - 34.0 pg   MCHC 29.7 (L) 30.0 - 36.0 g/dL   RDW 86.7 88.4 - 84.4 %   Platelets 190 150 - 400 K/uL   nRBC 0.0 0.0 - 0.2 %   Neutrophils Relative % 79 %   Neutro Abs 4.7 1.7 - 7.7 K/uL   Lymphocytes Relative 9 %   Lymphs Abs 0.6 (L) 0.7 - 4.0 K/uL   Monocytes Relative 10 %   Monocytes Absolute 0.6 0.1 - 1.0 K/uL   Eosinophils Relative 0 %   Eosinophils Absolute 0.0 0.0 - 0.5 K/uL   Basophils Relative 1 %   Basophils Absolute 0.0 0.0 - 0.1 K/uL   Immature Granulocytes 1 %   Abs Immature Granulocytes 0.04 0.00 - 0.07 K/uL    Comment:  Performed at [facility], [address] [place], [place] [address]  Pro Brain natriuretic peptide     Status: None   Collection Time: 10/31/24  4:38 PM  Result Value Ref Range   Pro Brain Natriuretic Peptide 145.0 <300.0 pg/mL    Comment: (NOTE) Age Group        Cut-Points    Interpretation  < 50 years     450 pg/mL       NT-proBNP > 450 pg/mL indicates                                ADHF is likely              50 to 75 years  900 pg/mL      NT-proBNP > 900 pg/mL indicates          ADHF is likely  > 75 years      1800 pg/mL     NT-proBNP > 1800 pg/mL indicates          ADHF is likely                           All ages    Results between       Indeterminate. Further clinical             300 and the cut-   information is needed to determine            point for age group   if ADHF is present.  Elecsys proBNP II/ Elecsys proBNP II STAT           Cut-Point                       Interpretation  300 pg/mL                    NT-proBNP <300pg/mL indicates                             ADHF is not likely  Performed at [facility], [address] [place], [place] [address]       Assessment & Plan:   No diagnosis found.  No orders of the defined types were placed in this encounter.   No orders of the defined types were placed in this encounter.     Advised if symptoms do not improve or worsen, to please contact office for sooner follow up or seek emergency care.    I spent xxx minutes of dedicated to the care of this patient on the date of this encounter to include pre-visit review of records, face-to-face time with the patient discussing conditions above, post visit ordering of testing, clinical documentation with the electronic health record, making appropriate referrals as documented, and communicating necessary findings to members of the patients care team.     C. Leita Sanders,  MD Advanced Bronchoscopy PCCM [facility]    *This note was generated using voice recognition software/Dragon and/or AI transcription program.  Despite best efforts to proofread, errors can occur which can change the meaning. Any transcriptional errors that result from this process are unintentional and may not be fully corrected at the time of dictation.

## 2024-11-07 ENCOUNTER — Ambulatory Visit
Admission: RE | Admit: 2024-11-07 | Discharge: 2024-11-07 | Disposition: A | Discharge status: Home / Self Care | Source: Ambulatory Visit

## 2024-11-07 ENCOUNTER — Ambulatory Visit: Admission: RE | Admit: 2024-11-07 | Discharge: 2024-11-07 | Attending: Pulmonary Disease | Admitting: Pulmonary Disease

## 2024-11-07 ENCOUNTER — Other Ambulatory Visit: Admission: RE | Admit: 2024-11-07 | Discharge: 2024-11-07 | Disposition: A | Source: Ambulatory Visit

## 2024-11-07 DIAGNOSIS — H538 Other visual disturbances: Secondary | ICD-10-CM | POA: Diagnosis present

## 2024-11-07 DIAGNOSIS — J449 Chronic obstructive pulmonary disease, unspecified: Secondary | ICD-10-CM | POA: Insufficient documentation

## 2024-11-07 DIAGNOSIS — R251 Tremor, unspecified: Secondary | ICD-10-CM | POA: Diagnosis present

## 2024-11-07 DIAGNOSIS — R42 Dizziness and giddiness: Secondary | ICD-10-CM | POA: Insufficient documentation

## 2024-11-07 MED ORDER — GADOBUTROL 1 MMOL/ML IV SOLN
7.5000 mL | Freq: Once | INTRAVENOUS | Status: AC | PRN
  Administered 2024-11-07: 7.5 mL via INTRAVENOUS

## 2024-11-08 ENCOUNTER — Ambulatory Visit: Payer: Self-pay | Admitting: Pulmonary Disease

## 2024-11-08 NOTE — Progress Notes (Signed)
 1st attempt -- called pt and the line would ring but drop. Tried twice. Will try again later.

## 2024-11-09 NOTE — Progress Notes (Signed)
 2nd attempt -- busy signal, call will not go through.

## 2024-11-12 ENCOUNTER — Other Ambulatory Visit: Payer: Self-pay

## 2024-11-12 NOTE — Progress Notes (Signed)
 Specialty Pharmacy Refill Coordination Note  Leslie Sims is a 73 y.o. male contacted today regarding refills of specialty medication(s) Mepolizumab  (Nucala )   Patient requested Delivery   Delivery date: 11/14/24   Verified address: [address]   [place] [phone]   Medication will be filled on: 11/13/24

## 2024-11-13 LAB — ALPHA-1-ANTITRYPSIN PHENOTYP: A-1 Antitrypsin, Ser: 159 mg/dL (ref 101–187)

## 2024-11-13 NOTE — Progress Notes (Signed)
 Letter generated for pt. NFN.

## 2024-11-16 DIAGNOSIS — H538 Other visual disturbances: Secondary | ICD-10-CM

## 2024-11-16 DIAGNOSIS — R251 Tremor, unspecified: Secondary | ICD-10-CM

## 2024-11-16 DIAGNOSIS — R42 Dizziness and giddiness: Secondary | ICD-10-CM

## 2024-11-19 ENCOUNTER — Other Ambulatory Visit: Payer: Self-pay

## 2024-11-19 DIAGNOSIS — H538 Other visual disturbances: Secondary | ICD-10-CM

## 2024-11-19 DIAGNOSIS — R251 Tremor, unspecified: Secondary | ICD-10-CM

## 2024-11-19 DIAGNOSIS — R42 Dizziness and giddiness: Secondary | ICD-10-CM

## 2024-11-21 ENCOUNTER — Telehealth: Payer: Self-pay

## 2024-11-21 NOTE — Telephone Encounter (Signed)
 Copied from CRM #[id number]. Topic: Clinical - Lab/Test Results >> Nov 21, 2024  9:13 AM Devaughn RAMAN wrote: Reason for CRM: Pt called regarding MRI and Ultrasound Results. Advised pt of Ultrasound results per Dr.Gonzalez.  Pt would like a voicemail left on [phone] regarding MRI Results.

## 2024-11-27 ENCOUNTER — Other Ambulatory Visit: Payer: Self-pay | Admitting: Cardiology

## 2024-12-04 ENCOUNTER — Other Ambulatory Visit (HOSPITAL_COMMUNITY): Payer: Self-pay

## 2024-12-10 ENCOUNTER — Other Ambulatory Visit: Payer: Self-pay

## 2024-12-12 ENCOUNTER — Other Ambulatory Visit: Payer: Self-pay

## 2024-12-14 ENCOUNTER — Other Ambulatory Visit: Payer: Self-pay

## 2024-12-14 NOTE — Progress Notes (Signed)
 Specialty Pharmacy Refill Coordination Note  Leslie Sims is a 73 y.o. male contacted today regarding refills of specialty medication(s) Mepolizumab  (Nucala )   Patient requested Delivery   Delivery date: 12/18/24   Verified address: [address]   [place] [address]   Medication will be filled on: 12/17/24

## 2024-12-17 ENCOUNTER — Other Ambulatory Visit: Payer: Self-pay

## 2024-12-17 ENCOUNTER — Telehealth: Payer: Self-pay

## 2024-12-17 NOTE — Telephone Encounter (Signed)
 Pt lvm 12/11/25 requesting a call back.  No Reason provided.  Chart reviewed.  He will be due for his repeat colonoscopy 03/19/25.  It was performed by Dr. Unk 03/19/20. She recommended repeat in 5 years.  Pt is diabetic takes Farxiga  (3) day hold.  Cardiac history: CHF, Unstable Angina, CAD Cardiac Clearance sent to [facility] in advance.  Pulmonary history:  COPD Pulmonary clearance sent to [facility].  Thanks,  [place], CMA

## 2024-12-21 ENCOUNTER — Telehealth (HOSPITAL_BASED_OUTPATIENT_CLINIC_OR_DEPARTMENT_OTHER): Payer: Self-pay

## 2024-12-21 NOTE — Telephone Encounter (Signed)
 Ellouise, you recently saw this pt in clinic. Are you able to comment on surgical clearance for upcoming colonoscopy scheduled for TBD? Please route your response to P CV DIV PREOP. Thank you!

## 2024-12-21 NOTE — Telephone Encounter (Signed)
"  ° °  Pre-operative Risk Assessment    Patient Name: Leslie Sims [facility]  DOB: [birth year] MRN: [id number]   Date of last office visit: 11/06/2024 with Ellouise Class, FNP ([facility]) and 10/23/2024 with [name], PA-C Date of next office visit: None  Request for Surgical Clearance    Procedure:  colonoscopy   Date of Surgery:  Clearance TBD                                 Surgeon:  TBD upon scheduling  Surgeon's Group or Practice Name:  [facility]  GI  Phone number:  [phone]  Fax number:  [phone] Lilly   Type of Clearance Requested:   - Medical  - Pharmacy:  Hold Aspirin  - clearance states that pt does not take any blood thinners that require stopping and that Farxiga  will be stopped 3 days prior       Type of Anesthesia:  General    Additional requests/questions:  None  Signed, Patrcia Iverson CROME   12/21/2024, 12:23 PM   "

## 2024-12-24 NOTE — Telephone Encounter (Signed)
 Will forward back to preop APP as the requesting office sent a duplicate today inquiring if the pt has been cleared.

## 2024-12-24 NOTE — Telephone Encounter (Signed)
"  ° °  Patient Name: Leslie Sims [facility]  DOB: 01-12-1951 MRN: [id number]  Primary Cardiologist: [name], MD  Chart reviewed as part of pre-operative protocol coverage. Given past medical history and time since last visit, based on ACC/AHA guidelines, Leslie Sims is at acceptable risk for the planned procedure without further cardiovascular testing.   Patient has RCRI score of 2 points which indicates 5% risk of major cardiac event. Even though history of HF / MI, risk of cardiac even should be low with having colonoscopy done.   Per office protocol, if patient is without any new symptoms or concerns at the time of their virtual visit, she may hold Aspirin  for  up to 7 days prior to procedure and Farxiga  for 3 days. Please resume aspirin  and Farxiga  as soon as possible postprocedure, at the discretion of the surgeon.    The patient was advised that if he develops new symptoms prior to surgery to contact our office to arrange for a follow-up visit, and he verbalized understanding.  I will route this recommendation to the requesting party via Epic fax function and remove from pre-op pool.  Please call with questions.  Lamarr Satterfield, NP 12/24/2024, 12:36 PM  "

## 2024-12-25 ENCOUNTER — Other Ambulatory Visit: Payer: Self-pay

## 2024-12-25 ENCOUNTER — Ambulatory Visit
Admission: EM | Admit: 2024-12-25 | Discharge: 2024-12-25 | Disposition: A | Discharge status: Home / Self Care | Attending: Emergency Medicine | Admitting: Emergency Medicine

## 2024-12-25 ENCOUNTER — Ambulatory Visit: Payer: Self-pay | Admitting: Emergency Medicine

## 2024-12-25 ENCOUNTER — Ambulatory Visit

## 2024-12-25 DIAGNOSIS — Z860100 Personal history of colon polyps, unspecified: Secondary | ICD-10-CM

## 2024-12-25 DIAGNOSIS — J441 Chronic obstructive pulmonary disease with (acute) exacerbation: Secondary | ICD-10-CM | POA: Diagnosis not present

## 2024-12-25 MED ORDER — IPRATROPIUM-ALBUTEROL 0.5-2.5 (3) MG/3ML IN SOLN
3.0000 mL | Freq: Once | RESPIRATORY_TRACT | Status: AC
  Administered 2024-12-25: 3 mL via RESPIRATORY_TRACT

## 2024-12-25 MED ORDER — PREDNISONE 20 MG PO TABS: 40.0000 mg | ORAL_TABLET | Freq: Every day | ORAL | 0 refills | Status: AC

## 2024-12-25 MED ORDER — PREDNISONE 50 MG PO TABS: 50.0000 mg | ORAL_TABLET | Freq: Once | ORAL | Status: DC

## 2024-12-25 MED ORDER — AMOXICILLIN-POT CLAVULANATE 875-125 MG PO TABS: 1.0000 | ORAL_TABLET | Freq: Two times a day (BID) | ORAL | 0 refills | Status: DC

## 2024-12-25 MED ORDER — ALBUTEROL SULFATE (2.5 MG/3ML) 0.083% IN NEBU
2.5000 mg | INHALATION_SOLUTION | Freq: Once | RESPIRATORY_TRACT | Status: AC
  Administered 2024-12-25: 2.5 mg via RESPIRATORY_TRACT

## 2024-12-25 MED ORDER — PREDNISONE 20 MG PO TABS
40.0000 mg | ORAL_TABLET | Freq: Once | ORAL | Status: AC
  Administered 2024-12-25: 40 mg via ORAL

## 2024-12-25 NOTE — Telephone Encounter (Signed)
 Patient has since been established with [facility] despite last colonoscopy being with Dr Unk here at [facility].  Pt has been informed that since he is now established with [facility], his colonoscopy will need to be performed by [facility].  He verbalized understanding.  I told him I would contact [facility] on his behalf as well to make them aware that pt will be due for his colonoscopy in April of 2026.  Spoke with Delon and informed her that cardiac clearance has been obtained.  Faxed clearance and procedure notes to [facility] and asked them to schedule.  Faxed to [place] at [facility].  Thanks,  [place] CMA

## 2024-12-25 NOTE — ED Provider Notes (Signed)
 " HPI  SUBJECTIVE:  Leslie Sims is a 74 y.o. male who presents with shortness of breath and wheezing since being exposed to dust last week.  He reports worsening dyspnea on exertion, nonproductive cough.  No chest pain, pressure or heaviness, fevers.  He has not needed additional supplemental oxygen .  No lower extremity edema, unintentional weight gain, nocturia, orthopnea, PND, abdominal pain.  He has not needed to take Lasix  over the past week.  He states this is identical to previous COPD exacerbations and not CHF.  He has been doing his albuterol  nebs 4 times a day, normally thousand 3 times a day and has been using his rescue inhaler 2-3 times a day.  He normally uses this as needed.  The albuterol  helps.  Symptoms are worse with exertion.  He has a past medical history of prostate cancer, severe COPD on home oxygen  with a remote history of admission/intubation, coronary artery disease status post CABG x 2 and stent placement, diabetes, HFrEF, hyperlipidemia, hypertension, lacunar CVA.  PCP: [facility].  Pulmonology: [facility].  Past Medical History:  Diagnosis Date   Allergic rhinitis    Anxiety and depression    Back pain    Cancer (HCC)    prostate   Colon polyps 08/2012   colonoscopy; multiple colon polyps; repeat colonoscopy in one year.   COPD (chronic obstructive pulmonary disease) (HCC)    a. on home O2.   Coronary artery disease 11/2009   a. late presenting ant MI; b. LHC 100% pLAD s/p PCI/DES, 99% mRCA s/p PCI/DES, EF 35%; c. nuclear stress test 05/13: prior ant/inf infarcts w/o ischemia, EF 43%; d. LHC 02/14: widely patent stents with no other obs dz, EF 40%; e. LHC 11/17: LM nl, mLAD 10%, patent LAD stent, dLAD 20%, p-mRCA 10%, mRCA 40%, patent RCA stent, EF 35-45%    Diabetes (HCC)    Dysphagia    GERD (gastroesophageal reflux disease)    Headache(784.0)    Helicobacter pylori (H. pylori)    Hernia    inguinal   HFrEF (heart failure with reduced ejection  fraction) (HCC)    a. 10/2016 LV gram: EF 35-45%; b. 06/2018 Echo: EF 40-45%. c. 01/2021 Echo: EF 40-45%   Hyperlipidemia    Hypertension    Insomnia    Myalgia    Peptic ulcer    Pulmonary nodules    Status post dilation of esophageal narrowing 2000   Thickening of esophagus    Tinea pedis    Wears dentures    partial upper    Past Surgical History:  Procedure Laterality Date    coronary stents     Admission  12/20/2012   COPD exacerbation.  [facility].   CARDIAC CATHETERIZATION     CARDIAC CATHETERIZATION  02/05/2013   [facility]   CARDIAC CATHETERIZATION  09/19/2013   [facility] : patent stents with no change in anatomy. EF: 40$   CARDIAC CATHETERIZATION Left 11/12/2016   Procedure: Left Heart Cath and Coronary Angiography;  Surgeon: Deatrice DELENA Cage, MD;  Location: [facility] INVASIVE CV LAB;  Service: Cardiovascular;  Laterality: Left;   COLONOSCOPY     COLONOSCOPY WITH PROPOFOL  N/A 05/18/2016   Procedure: COLONOSCOPY WITH PROPOFOL ;  Surgeon: Rogelia Copping, MD;  Location: [facility] ENDOSCOPY;  Service: Endoscopy;  Laterality: N/A;   COLONOSCOPY WITH PROPOFOL  N/A 03/19/2020   Procedure: COLONOSCOPY WITH PROPOFOL ;  Surgeon: Unk Corinn Skiff, MD;  Location: [facility] ENDOSCOPY;  Service: Gastroenterology;  Laterality: N/A;   CORONARY ANGIOPLASTY WITH STENT PLACEMENT  09/19/2009   LAD 3.0 X23 mm Xience DES, RCA: 4.0 X 15 mm Xience DES   ELECTROMAGNETIC NAVIGATION BROCHOSCOPY N/A 10/27/2017   Procedure: ELECTROMAGNETIC NAVIGATION BRONCHOSCOPY;  Surgeon: Isaiah Scrivener, MD;  Location: [facility] ORS;  Service: Cardiopulmonary;  Laterality: N/A;   ESOPHAGEAL DILATION     ESOPHAGOGASTRODUODENOSCOPY  12/20/2006   ESOPHAGOGASTRODUODENOSCOPY  08/20/2012   ESOPHAGOGASTRODUODENOSCOPY (EGD) WITH PROPOFOL  N/A 04/19/2022   Procedure: ESOPHAGOGASTRODUODENOSCOPY (EGD) WITH PROPOFOL ;  Surgeon: Maryruth Ole DASEN, MD;  Location: [facility] ENDOSCOPY;  Service: Endoscopy;  Laterality: N/A;   ESOPHAGOGASTRODUODENOSCOPY (EGD) WITH PROPOFOL   N/A 04/27/2022   Procedure: ESOPHAGOGASTRODUODENOSCOPY (EGD) WITH PROPOFOL ;  Surgeon: Maryruth Ole DASEN, MD;  Location: [facility] ENDOSCOPY;  Service: Endoscopy;  Laterality: N/A;   ESOPHAGOGASTRODUODENOSCOPY (EGD) WITH PROPOFOL  N/A 11/08/2022   Procedure: ESOPHAGOGASTRODUODENOSCOPY (EGD) WITH PROPOFOL ;  Surgeon: Maryruth Ole DASEN, MD;  Location: [facility] ENDOSCOPY;  Service: Endoscopy;  Laterality: N/A;   EUS N/A 05/20/2022   Procedure: UPPER ENDOSCOPIC ULTRASOUND (EUS) LINEAR;  Surgeon: Elta Fonda SQUIBB, MD;  Location: [facility] ENDOSCOPY;  Service: Gastroenterology;  Laterality: N/A;  [facility]   HERNIA REPAIR  07/20/2012   L inguinal hernia repair   PENILE PROSTHESIS IMPLANT     RIGHT/LEFT HEART CATH AND CORONARY ANGIOGRAPHY Bilateral 04/04/2023   Procedure: RIGHT/LEFT HEART CATH AND CORONARY ANGIOGRAPHY;  Surgeon: Darron Deatrice LABOR, MD;  Location: [facility] INVASIVE CV LAB;  Service: Cardiovascular;  Laterality: Bilateral;    Family History  Problem Relation Age of Onset   Heart attack Brother        Brother #1   Diabetes Brother    Hypertension Brother        #3   Coronary artery disease Father 58       deceased   Heart attack Father    Diabetes Father    Heart disease Father    COPD Mother 105       deceased   Alcohol abuse Sister        polysubstance abuse   COPD Sister    Lung cancer Sister    Alcohol abuse Sister        polysubstance abuse   Penile cancer Brother    Diabetes Brother    Prostate cancer Neg Hx    Bladder Cancer Neg Hx    Kidney cancer Neg Hx     Social History[1]  Current Medications[2]  Allergies[3]   ROS  As noted in HPI.   Physical Exam  BP 104/64 (BP Location: Left Arm)   Pulse 65   Temp 98.1 F (36.7 C) (Oral)   Resp 19   Wt 82.1 kg   SpO2 93%   BMI 25.97 kg/m   Constitutional: Well developed, well nourished, no acute distress.  Speaking in full sentences.  no respiratory distress.  on supplemental oxygen . Eyes: PERRL, EOMI, conjunctiva normal  bilaterally HENT: Normocephalic, atraumatic,mucus membranes moist Respiratory: Poor air movement, faint wheezing left lower lobe. Cardiovascular: Normal rate and rhythm, no murmurs, no gallops, no rubs GI: nondistended skin: normal Musculoskeletal: no lower extremity edema Neurologic: Alert & oriented x 3, CN III-XII grossly intact, no motor deficits, sensation grossly intact Psychiatric: Speech and behavior appropriate   ED Course   Medications  albuterol  (PROVENTIL ) (2.5 MG/3ML) 0.083% nebulizer solution 2.5 mg (2.5 mg Nebulization Given 12/25/24 1255)  ipratropium-albuterol  (DUONEB) 0.5-2.5 (3) MG/3ML nebulizer solution 3 mL (3 mLs Nebulization Given 12/25/24 1254)  predniSONE  (DELTASONE ) tablet 40 mg (40 mg Oral Given 12/25/24 1254)    Orders Placed This Encounter  Procedures   DG Chest 2 View    Standing Status:   Standing    Number of Occurrences:   1    Reason for Exam (SYMPTOM  OR DIAGNOSIS REQUIRED):   History of stage IV COPD, wheezing left lower lobe, rule out pneumonia   ED EKG    Standing Status:   Standing    Number of Occurrences:   1    Reason for Exam:   Shortness of breath   EKG 12-Lead    Standing Status:   Standing    Number of Occurrences:   1   No results found.  DG Chest 2 View Result Date: 12/25/2024 CLINICAL DATA:  COPD with new left lower lobe wheezing EXAM: CHEST - 2 VIEW COMPARISON:  Prior chest x-ray 10/31/2024; CT scan of the chest 10/10/2024 FINDINGS: Cardiac and mediastinal contours are within normal limits. Atherosclerotic calcifications are present within the aorta. No evidence of focal infiltrate, pulmonary edema, pleural effusion or pneumothorax. 9 mm left upper lobe pulmonary nodule is unchanged compared to prior imaging. Prior CT imaging demonstrates this to represent a calcified benign granuloma. Hyperinflation and diffuse chronic bronchitic changes are again noted. Dystrophic calcification present within the soft tissues of the left axillary  region. IMPRESSION: 1. Stable chest x-ray without evidence of acute cardiopulmonary process. 2. Hyperinflation and chronic bronchitic changes consistent with the clinical history of COPD. Electronically Signed   By: Wilkie Lent M.D.   On: 12/25/2024 13:26     ED Clinical Impression  1. COPD exacerbation (HCC)      ED Assessment/Plan    O2 sat is at baseline  Suspect COPD exacerbation, however, given his comorbidities will check at chest x-ray and EKG.  EKG: Normal sinus rhythm, rate 63.  Normal axis.  Nonspecific intraventricular conduction delay.  No hypertrophy.  T wave inversion in 1 and aVL present in previous EKG from November 2025.  No ST elevation or other reciprocal changes.  Reviewed imaging independently.  No acute changes compared to chest x-ray from October 31, 2024 formal radiology overread pending.  Discussed this with patient.  Will contact him at [phone] if radiology overread differs not from mine and we need to change management.  Reviewed radiology report.  Stable bronchitic changes, hyperinflation consistent with COPD, no acute cardiopulmonary disease consistent with my read. See radiology report for full details.  Patient was given DuoNeb 5/0.5 mg and 40 mg of prednisone  here.  On reevaluation, patient states that he feels better.  Improved air movement louder isolated wheezing left lower lobe.  will treat as a COPD exacerbation with Augmentin  875 mg p.o. twice daily for 1 week, regularly scheduled albuterol  inhaler with a spacer for 4 days, then as prescribed/needed thereafter, prednisone  40 mg for 5 days.  He does not need a refill of his bronchodilators.  Discussed EKG,, imaging, MDM, treatment plan, and plan for follow-up with patient Discussed sn/sx that should prompt return to the ED. patient agrees with plan.   Meds ordered this encounter  Medications   albuterol  (PROVENTIL ) (2.5 MG/3ML) 0.083% nebulizer solution 2.5 mg   ipratropium-albuterol   (DUONEB) 0.5-2.5 (3) MG/3ML nebulizer solution 3 mL   DISCONTD: predniSONE  (DELTASONE ) tablet 50 mg   predniSONE  (DELTASONE ) tablet 40 mg   amoxicillin -clavulanate (AUGMENTIN ) 875-125 MG tablet    Sig: Take 1 tablet by mouth every 12 (twelve) hours.    Dispense:  14 tablet    Refill:  0   predniSONE  (DELTASONE ) 20 MG tablet  Sig: Take 2 tablets (40 mg total) by mouth daily with breakfast for 5 days.    Dispense:  10 tablet    Refill:  0      *This clinic note was created using Scientist, clinical (histocompatibility and immunogenetics). Therefore, there may be occasional mistakes despite careful proofreading. ?      [1]  Social History Tobacco Use   Smoking status: Former    Current packs/day: 0.00    Average packs/day: 3.0 packs/day for 42.0 years (126.0 ttl pk-yrs)    Types: Cigarettes    Start date: 02/10/1977    Quit date: 02/10/2019    Years since quitting: 5.8   Smokeless tobacco: Never   Tobacco comments:    25 months ago most smoked 4 packs a day .  Vaping Use   Vaping status: Never Used  Substance Use Topics   Alcohol use: No    Alcohol/week: 0.0 standard drinks of alcohol   Drug use: No  [2] No current facility-administered medications for this encounter.  Current Outpatient Medications:    albuterol  (VENTOLIN  HFA) 108 (90 Base) MCG/ACT inhaler, TAKE 2 PUFFS BY MOUTH EVERY 6 HOURS AS NEEDED FOR WHEEZE OR SHORTNESS OF BREATH, Disp: 18 each, Rfl: 2   ALPRAZolam  (XANAX ) 0.5 MG tablet, Take 0.5 mg by mouth 3 (three) times daily., Disp: , Rfl:    amoxicillin -clavulanate (AUGMENTIN ) 875-125 MG tablet, Take 1 tablet by mouth every 12 (twelve) hours., Disp: 14 tablet, Rfl: 0   aspirin  81 MG tablet, Take 81 mg by mouth daily., Disp: , Rfl:    atorvastatin  (LIPITOR ) 80 MG tablet, Take 1 tablet (80 mg total) by mouth daily., Disp: 90 tablet, Rfl: 3   bisoprolol  (ZEBETA ) 5 MG tablet, TAKE 1/2 TABLET BY MOUTH DAILY, Disp: 45 tablet, Rfl: 2   budesonide  (PULMICORT ) 0.5 MG/2ML nebulizer solution, Take 2 mLs  (0.5 mg total) by nebulization in the morning and at bedtime., Disp: 360 mL, Rfl: 10   dapagliflozin  propanediol (FARXIGA ) 10 MG TABS tablet, TAKE 1 TABLET BY MOUTH DAILY BEFORE BREAKFAST., Disp: 30 tablet, Rfl: 11   Ensifentrine  (OHTUVAYRE ) 3 MG/2.5ML SUSP, Inhale 3 mLs into the lungs 2 (two) times daily. DX:J44.9, Disp: 450 mL, Rfl: 2   ezetimibe  (ZETIA ) 10 MG tablet, Take 1 tablet (10 mg total) by mouth daily., Disp: 90 tablet, Rfl: 3   famotidine  (PEPCID ) 20 MG tablet, TAKE 1 TABLET (20 MG TOTAL) BY MOUTH 2 (TWO) TIMES DAILY. FOR BREAKTHROUGH REFLUX, Disp: 180 tablet, Rfl: 3   FLUoxetine  (PROZAC ) 20 MG tablet, Take 20 mg by mouth daily., Disp: , Rfl:    fluticasone  (FLONASE ) 50 MCG/ACT nasal spray, Place 2 sprays into both nostrils daily., Disp: , Rfl:    furosemide  (LASIX ) 20 MG tablet, Take 1 tablet (20 mg total) by mouth daily. (Patient taking differently: Take 20 mg by mouth as needed for fluid.), Disp: 30 tablet, Rfl: 0   ipratropium-albuterol  (DUONEB) 0.5-2.5 (3) MG/3ML SOLN, Take 3 mLs by nebulization every 6 (six) hours as needed., Disp: 360 mL, Rfl: 11   ketoconazole  (NIZORAL ) 2 % cream, Apply 1 Application topically daily., Disp: , Rfl:    Mepolizumab  (NUCALA ) 100 MG/ML SOAJ, Inject 1 mL (100 mg total) into the skin every 28 (twenty-eight) days., Disp: 1 mL, Rfl: 3   mirtazapine  (REMERON ) 45 MG tablet, Take 45 mg by mouth at bedtime., Disp: , Rfl:    Multiple Vitamin (MULTIVITAMIN) capsule, Take 1 capsule by mouth daily., Disp: , Rfl:    nitroGLYCERIN  (NITROSTAT ) 0.4  MG SL tablet, Place 1 tablet (0.4 mg total) under the tongue every 5 (five) minutes as needed., Disp: 25 tablet, Rfl: 3   OXYGEN , Inhale 3 L into the lungs daily., Disp: , Rfl:    pantoprazole  (PROTONIX ) 40 MG tablet, Take 40 mg by mouth 2 (two) times daily before a meal., Disp: , Rfl:    predniSONE  (DELTASONE ) 10 MG tablet, Take 1 tablet (10 mg total) by mouth daily with breakfast., Disp: 30 tablet, Rfl: 1   predniSONE   (DELTASONE ) 20 MG tablet, Take 2 tablets (40 mg total) by mouth daily with breakfast for 5 days., Disp: 10 tablet, Rfl: 0   sacubitril -valsartan  (ENTRESTO ) 49-51 MG, TAKE 1 TABLET BY MOUTH TWICE A DAY, Disp: 60 tablet, Rfl: 6   Spacer/Aero-Holding Chambers (AEROCHAMBER MV) inhaler, Use as instructed, Disp: 1 each, Rfl: 2   spironolactone  (ALDACTONE ) 25 MG tablet, Take 0.5 tablets (12.5 mg total) by mouth daily., Disp: 90 tablet, Rfl: 3 [3]  Allergies Allergen Reactions   Prozac  [Fluoxetine  Hcl] Shortness Of Breath   Hydroxyzine     Bupropion Other (See Comments)    Makes me feel funny Other reaction(s): Other (See Comments) Unknown/made me feel real funny Makes me feel funny Makes me feel funny Other reaction(s): Other (See Comments) Unknown/made me feel real funny Makes me feel funny   Effexor Xr [Venlafaxine Hcl Er] Other (See Comments)    Makes me feel funny     Van Knee, MD 12/26/24 1452  "

## 2024-12-25 NOTE — Discharge Instructions (Addendum)
 EKG, chest x-ray unchanged from previous in November. Take two puffs from your albuterol  inhaler with a spacer or a 2.5 mg albuterol  nebulizer treatment every 4 hours for 2 days, then every 6 hours for 2 days, then as needed. You can back off if you start to improve  sooner. Finish the steroids unless your doctor tells you to stop. Finish the antibiotics, even if you feel better. You may start the steroids tomorrow, as you have already taken today's dose.  Make sure you drink extra fluids. Return to the ER if you get worse, have a fever >100.4, or any other concerns.   If the spacer is too expensive at the pharmacy, you can get an AeroChamber Z-Stat off of [employer] for about $10-$15.  Go to www.goodrx.com  or www.costplusdrugs.com to look up your medications. This will give you a list of where you can find your prescriptions at the most affordable prices. Or ask the pharmacist what the cash price is, or if they have any other discount programs available to help make your medication more affordable. This can be less expensive than what you would pay with insurance.

## 2024-12-25 NOTE — ED Triage Notes (Signed)
 Patient states that he's having SOB and wheezing for 1 week. No respiratory sx. Patient has a hx of COPD. Patient is using his portable oxygen 

## 2024-12-30 ENCOUNTER — Other Ambulatory Visit: Payer: Self-pay | Admitting: Pulmonary Disease

## 2025-01-03 ENCOUNTER — Ambulatory Visit: Admitting: Pulmonary Disease

## 2025-01-03 ENCOUNTER — Other Ambulatory Visit: Payer: Self-pay | Admitting: Pulmonary Disease

## 2025-01-03 ENCOUNTER — Telehealth: Payer: Self-pay

## 2025-01-03 MED ORDER — AZITHROMYCIN 500 MG PO TABS: 500.0000 mg | ORAL_TABLET | Freq: Every day | ORAL | 0 refills | Status: AC

## 2025-01-03 MED ORDER — METHYLPREDNISOLONE 4 MG PO TBPK: ORAL_TABLET | ORAL | 0 refills | Status: AC

## 2025-01-03 NOTE — Telephone Encounter (Signed)
 Patient was scheduled for a surgical clearance today for a colonoscopy. His procedure was canceled and after speaking to provider Dr. Lenda, he did not need to be seen today.  Spoke with patient and notified him of the noted above. He stated that he was seen at UC last week and was still having a hard time with his cough. I spoke with the provider and it was stated that he did not need to come into the office and medications would be sent to his pharmacy. Patient verbally understood.

## 2025-01-03 NOTE — Progress Notes (Signed)
 Patient was supposed to come in today for a surgical clearance for screening colonoscopy under general anesthesia.  However the patient is a high/prohibitive risk for general anesthesia.  Colonoscopy has been canceled.  Patient would like to reschedule.  He was seen in urgent care on 6 January with an exacerbation.  States he still coughing up purulent looking sputum.  Prescribed azithromycin  and Medrol  Dosepak.  Will see the patient in follow-up in 2 months time.   [place] Leita Sanders, MD Advanced Bronchoscopy PCCM [facility]-

## 2025-01-03 NOTE — Telephone Encounter (Signed)
 I agree with narrative as below.

## 2025-01-07 ENCOUNTER — Other Ambulatory Visit: Payer: Self-pay

## 2025-01-07 ENCOUNTER — Telehealth: Payer: Self-pay | Admitting: Family

## 2025-01-07 NOTE — Telephone Encounter (Signed)
 Called to confirm/remind patient of their appointment at [facility] on 01/08/25.   Appointment:   [x] Confirmed  [] Left mess   [] No answer/No voice mail  [] VM Full/unable to leave message  [] Phone not in service  Patient reminded to bring all medications and/or complete list.  Confirmed patient has transportation. Gave directions, instructed to utilize valet parking.

## 2025-01-07 NOTE — Progress Notes (Unsigned)
 PCP: Claudene Rayfield HERO, MD Cardiology: Dr. Darron HF Cardiology: Dr. Rolan  Chief complaint: shortness of breath   HPI:  Leslie Sims is a 74 y.o. male with a history of CAD, ischemic cardiomyopathy, and severe COPD on home oxygen  was referred by Dr. Darron for evaluation of heart failure. Patient had an anterior MI in 2010.  At that time, he had DES to LAD and to RCA.  Repeat caths in 2017 and in 4/24 have shown patent stents and minimal progression of CAD.  Echoes showed EF 40-45% generally, then echo in 9/23 showed EF up to 50-55% though study was technically difficult.  LV-gram with cath in 4/24 suggested lower EF at 25-35%. Patient was a heavy smoker in the past, quit in 2020.  He has severe COPD and wears 3L home oxygen . 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    Due to progressive dyspnea and atypical chest pain, he had RHC/LHC done in 4/24.  Results of angiography and LV-gram as above, RHC showed normal filling pressures and preserved cardiac output.  He went to the ER 04/24 with cough and worsening dyspnea, he was thought to have a COPD exacerbation and was given prednisone  and nebs.    Echo in 5/24 showed EF 35-40%, mild LV dilation, normal RV.    Echo in 4/25 showed EF up to 50-55%, RV normal, IVC normal.   Admitted 05/14/24 with worsening SOB over previous week. + productive cough, EMS had oxygen  sats of 80's on 3L. Given nebulizer and IV solu-medrol . Found to have COPD exacerbation along with sepsis due to pneumonia. Treated with antibiotics and symptoms improved.   Admitted 09/16/24 with shortness of breath and increased swelling of feet. No worsening orthopnea. BNP slightly elevated in ED at 110. CXR was nonacute. Given IV solu-medrol  and IV lasix  with improvement of symptoms.   Seen in [facility] 09/28/24 for worsening SOB where lasix  was given for 2 more days and then returned back to PRN.    Seen in ED 10/10/24 for an increase in shortness of breath and lightheadedness. High-sensitivity troponin negative. Chest x-ray without acute cardiopulmonary finding. CTA chest negative for PE with persistent nodular density in the superior segment of the left lower lobe unchanged from prior study. He was treated for COPD exacerbation with DuoNebs with improvement in dyspnea and advised to take short prednisone  taper. He was advised to discuss lightheadedness with pulmonology given her recent initiation of Nucala .   Seen by cardiology 10/23/24 with dyspnea which he feels worsened after recent Nucala  injection followed by flu / covid vaccine. Weight down 16 pounds Sleeps in recliner at baseline for the last 7 years. Appeared euvolemic possibly volume depleted. BNP drawn.   He presents today for a HF follow-up visit with a chief complaint of shortness of breath. Has associated fatigue, constant dizziness, tremors left hand > right hand, blurry vision, little bit of pedal edema. Concerned that symptoms began after Nucala  injection. He is currently scheduled for a brain MRI tomorrow from his PCP due to his symptoms of tremors, dizziness and blurry vision. He says that the tremors even occur at rest or when he's holding something, like a coffee cup. Taking lasix  PRN and last took it yesterday due to swelling in his feet. Prior to yesterday, it had been several weeks since he had taken it.  Labs (2/24): LDL 47 Labs (4/24): hgb 13.3, K 4.1, creatinine 0.95 Labs (6/24): BNP 42, K 3.9, creatinine 0.93 Labs (7/24): LDL 58 Labs (2/25): K 4.6, creatinine 1.15 Labs (3/25): LDL 69 Labs (6/25): K 3.9, creatinine 1.05 Labs (7/25): K 3.9, creatinine 0.75, BNP 165.7 Labs (8/25): K 4.1, creatinine 0.75, Hg 12.5, HS troponin 5 Labs (9/25): K 3.6, creatinine 1.00 Labs (10/25): K 4, creatinine 0.9, HS-troponin 8, Hg  13.1 Labs (11/25): K 4.1, creatinine 0.98, LFT's normal, proBNP 145, Hg 13, D-dimer 0.29  PMH: 1. CAD: Anterior MI in 2010 with DES to LAD and RCA.  - LHC (2017): Patent stents.  - LHC (4/24): Mild nonobstructive CAD with patent stents.  2. Chronic systolic CHF: Ischemic cardiomyopathy.   - Echo (2019): EF 40-45% - Echo (2/22): EF 40-45% - Echo (9/23): Technically difficult, EF 50-55%.  - LV-gram (4/24): EF 25-35% - RHC (4/24): mean RA 6, PA 38/15, mean PCWP 15, CI 3.16 - Echo (5/24): EF 35-40%, mild LV dilation, normal RV. - Echo (4/25): EF 50-55%, RV normal, IVC normal. 3. COPD: On 3.5 L home oxygen , severe. Quit smoking 2020.  4. H/o PVCs 5. Esophageal squamous cell carcinoma: S/p chemo-radiation.  6. Prostate cancer: Radiation.   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  Vaping Use   Vaping status: Never Used  Substance and Sexual Activity   Alcohol use: No    Alcohol/week: 0.0 standard drinks of alcohol   Drug use: No   Sexual activity: Yes  Other Topics Concern   Not on file  Social History Narrative   Marital status: Widowed. Lives alone.    Social Drivers of Health   Tobacco Use: Medium Risk (12/25/2024)   Patient History    Smoking Tobacco Use: Former    Smokeless Tobacco Use: Never    Passive Exposure: Not on file  Financial Resource Strain: Low Risk  (10/11/2024)   Received from [facility]   Overall Financial Resource Strain (CARDIA)    Difficulty of Paying Living Expenses: Not hard at all  Food Insecurity: No Food Insecurity (10/11/2024)   Received from [facility]   Epic    Within the past 12 months, you worried that your food would run out before you got the money to buy more.: Never true    Within the past 12 months, the food you bought just didn't last and you didn't have money to get more.: Never true  Transportation Needs: No Transportation Needs (10/11/2024)   Received from [facility] - Transportation    In the past 12 months, has lack of transportation kept you from medical appointments or from getting medications?: No    Lack of Transportation (Non-Medical): No  Physical Activity: Inactive (09/10/2024)  Received from [facility]   Exercise Vital Sign    On average, how many days per week do you engage in moderate to strenuous exercise (like a brisk walk)?: 0 days    On average, how many minutes do you engage in exercise at this level?: 0 min  Stress: No Stress Concern Present (09/10/2024)   Received from [facility] - Occupational Stress Questionnaire    Feeling of Stress : Not at all  Social Connections: Unknown (09/17/2024)   Social Connection and Isolation Panel    Frequency of Communication with Friends and Family: Patient declined    Frequency of Social Gatherings with Friends and Family: Patient declined    Attends Religious Services: Not on Insurance Claims Handler of Clubs or Organizations: Not on file    Attends Banker Meetings: Not on file    Marital Status: Not on file  Recent Concern: Social Connections - Moderately Isolated (09/10/2024)   Received from [facility]   Social Connection and Isolation Panel    In a typical week, how many times do you talk on the phone with  family, friends, or neighbors?: Twice a week    How often do you get together with friends or relatives?: Once a week    How often do you attend church or religious services?: More than 4 times per year    Do you belong to any clubs or organizations such as church groups, unions, fraternal or athletic groups, or school groups?: No    How often do you attend meetings of the clubs or organizations you belong to?: Never    Are you married, widowed, divorced, separated, never married, or living with a partner?: Widowed  Intimate Partner Violence: Unknown (09/17/2024)   Epic    Fear of Current or Ex-Partner: Patient declined    Emotionally Abused: Patient declined    Physically Abused: Not on file    Sexually Abused: Patient declined  Depression (PHQ2-9): High Risk (05/23/2023)   Depression (PHQ2-9)    PHQ-2 Score: 15  Alcohol Screen: Not on file  Housing: Unknown (09/17/2024)   Epic    Unable to Pay for Housing in the Last Year: Patient declined    Number of Times Moved in the Last Year: Not on file    Homeless in the Last Year: Not on file  Utilities: Patient Declined (09/17/2024)   Epic    Threatened with loss of utilities: Patient declined  Health Literacy: Adequate Health Literacy (09/10/2024)   Received from [facility]   [phone] Health Literacy    How often do you need to have someone help you when you read instructions, pamphlets, or other written material from your doctor or pharmacy?: Rarely   Family History  Problem Relation Age of Onset   Heart attack Brother        Brother #1   Diabetes Brother    Hypertension Brother        #3   Coronary artery disease Father 68       deceased   Heart attack Father    Diabetes Father    Heart disease Father    COPD Mother 9       deceased   Alcohol abuse Sister        polysubstance abuse   COPD Sister    Lung cancer Sister    Alcohol abuse Sister  polysubstance abuse   Penile cancer Brother    Diabetes  Brother    Prostate cancer Neg Hx    Bladder Cancer Neg Hx    Kidney cancer Neg Hx    ROS: All systems reviewed and negative except as per HPI.   Current Outpatient Medications  Medication Sig Dispense Refill   albuterol  (VENTOLIN  HFA) 108 (90 Base) MCG/ACT inhaler TAKE 2 PUFFS BY MOUTH EVERY 6 HOURS AS NEEDED FOR WHEEZE OR SHORTNESS OF BREATH 18 each 2   ALPRAZolam  (XANAX ) 0.5 MG tablet Take 0.5 mg by mouth 3 (three) times daily.     amoxicillin -clavulanate (AUGMENTIN ) 875-125 MG tablet Take 1 tablet by mouth every 12 (twelve) hours. 14 tablet 0   aspirin  81 MG tablet Take 81 mg by mouth daily.     atorvastatin  (LIPITOR ) 80 MG tablet Take 1 tablet (80 mg total) by mouth daily. 90 tablet 3   bisoprolol  (ZEBETA ) 5 MG tablet TAKE 1/2 TABLET BY MOUTH DAILY 45 tablet 2   budesonide  (PULMICORT ) 0.5 MG/2ML nebulizer solution Take 2 mLs (0.5 mg total) by nebulization in the morning and at bedtime. 360 mL 10   dapagliflozin  propanediol (FARXIGA ) 10 MG TABS tablet TAKE 1 TABLET BY MOUTH DAILY BEFORE BREAKFAST. 30 tablet 11   Ensifentrine  (OHTUVAYRE ) 3 MG/2.5ML SUSP Inhale 3 mLs into the lungs 2 (two) times daily. DX:J44.9 450 mL 2   ezetimibe  (ZETIA ) 10 MG tablet Take 1 tablet (10 mg total) by mouth daily. 90 tablet 3   famotidine  (PEPCID ) 20 MG tablet TAKE 1 TABLET (20 MG TOTAL) BY MOUTH 2 (TWO) TIMES DAILY. FOR BREAKTHROUGH REFLUX 180 tablet 3   FLUoxetine  (PROZAC ) 20 MG tablet Take 20 mg by mouth daily.     fluticasone  (FLONASE ) 50 MCG/ACT nasal spray Place 2 sprays into both nostrils daily.     furosemide  (LASIX ) 20 MG tablet Take 1 tablet (20 mg total) by mouth daily. (Patient taking differently: Take 20 mg by mouth as needed for fluid.) 30 tablet 0   ipratropium-albuterol  (DUONEB) 0.5-2.5 (3) MG/3ML SOLN Take 3 mLs by nebulization every 6 (six) hours as needed. 360 mL 11   ketoconazole  (NIZORAL ) 2 % cream Apply 1 Application topically daily.     Mepolizumab  (NUCALA ) 100 MG/ML SOAJ Inject 1 mL  (100 mg total) into the skin every 28 (twenty-eight) days. 1 mL 3   methylPREDNISolone  (MEDROL  DOSEPAK) 4 MG TBPK tablet Take as directed in the package.  This is a taper pack. Hold regular prednisone  dose while taking this. 21 tablet 0   mirtazapine  (REMERON ) 45 MG tablet Take 45 mg by mouth at bedtime.     Multiple Vitamin (MULTIVITAMIN) capsule Take 1 capsule by mouth daily.     nitroGLYCERIN  (NITROSTAT ) 0.4 MG SL tablet Place 1 tablet (0.4 mg total) under the tongue every 5 (five) minutes as needed. 25 tablet 3   OXYGEN  Inhale 3 L into the lungs daily.     pantoprazole  (PROTONIX ) 40 MG tablet Take 40 mg by mouth 2 (two) times daily before a meal.     predniSONE  (DELTASONE ) 10 MG tablet TAKE 1 TABLET (10 MG TOTAL) BY MOUTH DAILY WITH BREAKFAST. 30 tablet 1   sacubitril -valsartan  (ENTRESTO ) 49-51 MG TAKE 1 TABLET BY MOUTH TWICE A DAY 60 tablet 6   Spacer/Aero-Holding Chambers (AEROCHAMBER MV) inhaler Use as instructed 1 each 2   spironolactone  (ALDACTONE ) 25 MG tablet Take 0.5 tablets (12.5 mg total) by mouth daily. 90 tablet 3   No current facility-administered  medications for this visit.   There were no vitals filed for this visit.  Wt Readings from Last 3 Encounters:  12/25/24 181 lb (82.1 kg)  11/06/24 177 lb 9.6 oz (80.6 kg)  11/06/24 178 lb 6.4 oz (80.9 kg)   Lab Results  Component Value Date   CREATININE 0.98 10/31/2024   CREATININE 0.90 10/10/2024   CREATININE 1.00 09/18/2024    Physical Exam:  General: Well appearing.  Cor: No JVD. Regular rhythm, rate.  Lungs: clear, diminished throughout all lung fields Abdomen: soft, nontender, nondistended. Extremities: trace pitting edema BLE. + tremors bilateral hands with L>R Neuro:. Affect pleasant   Assessment/Plan: 1. Chronic systolic CHF: Ischemic cardiomyopathy.  Historically, EF has been in the 40-45% range, but echo in 9/23 showed EF 50-55% (technically difficult study).  LV-gram with cath in 4/24 suggested that EF may  be down to the 25-35% range.  He had no new coronary disease on cath in 4/24 to explain fall in EF.  Echo in 5/24 showed EF 35-40%, mild LV dilation, normal RV.  Echo in 4/25 showed EF up to 50-55%, RV normal, IVC normal.  NYHA class III, but think severe COPD plays the major role in symptoms currently. Minimally fluid up with pedal edema.  - Take 3 more days of lasix  and then revert back to PRN usage - weight up 4 pounds from last visit 4 months ago - Continue beta-1 selective bisoprolol  2.5 mg daily.   - Continue Farxiga  10 mg daily.  - Continue Entresto  49/51 bid.   - Continue spironolactone  12.5mg  daily.  - BMET 10/31/24 reviewed: sodium 146, potassium 4.1, creatinine 0.98, GFR >60 2. COPD: Severe, on 3 L home oxygen . Prior smoker.  As above, suspect COPD is currently the major contributor to his dyspnea.   - Saw pulmonology 09/25. Returns later today 3.  CAD: Anterior MI in 2010 with DES to LAD and RCA.  Cath in 4/24 showed patent stents and mild nonobstructive CAD.  No recent exertional chest pain.   - Continue ASA 81 daily.  4: Hyperlipidemia- - Continue zetia  10mg  daily - Continue atorvastatin  80 mg daily, lipids ok in 3/25.   5: Hand tremors- - present for several days. Occurring both at rest and while holding a cup. Saw PCP who has ordered Brain MRI for tomorrow.    Return in 2 months, sooner if needed.   I spent 37 minutes reviewing records, interviewing/ examing patient and managing plan/ orders.   Ellouise DELENA Class 01/07/2025

## 2025-01-08 ENCOUNTER — Encounter: Payer: Self-pay | Admitting: Family

## 2025-01-08 ENCOUNTER — Other Ambulatory Visit: Payer: Self-pay

## 2025-01-08 ENCOUNTER — Ambulatory Visit: Attending: Family | Admitting: Family

## 2025-01-08 VITALS — BP 112/59 | HR 82 | Wt 187.4 lb

## 2025-01-08 DIAGNOSIS — Z7951 Long term (current) use of inhaled steroids: Secondary | ICD-10-CM | POA: Diagnosis not present

## 2025-01-08 DIAGNOSIS — Z87891 Personal history of nicotine dependence: Secondary | ICD-10-CM | POA: Insufficient documentation

## 2025-01-08 DIAGNOSIS — Z7952 Long term (current) use of systemic steroids: Secondary | ICD-10-CM | POA: Diagnosis not present

## 2025-01-08 DIAGNOSIS — R140 Abdominal distension (gaseous): Secondary | ICD-10-CM | POA: Diagnosis not present

## 2025-01-08 DIAGNOSIS — Z9981 Dependence on supplemental oxygen: Secondary | ICD-10-CM | POA: Insufficient documentation

## 2025-01-08 DIAGNOSIS — J449 Chronic obstructive pulmonary disease, unspecified: Secondary | ICD-10-CM

## 2025-01-08 DIAGNOSIS — I5022 Chronic systolic (congestive) heart failure: Secondary | ICD-10-CM | POA: Diagnosis not present

## 2025-01-08 DIAGNOSIS — I252 Old myocardial infarction: Secondary | ICD-10-CM | POA: Diagnosis not present

## 2025-01-08 DIAGNOSIS — I255 Ischemic cardiomyopathy: Secondary | ICD-10-CM | POA: Diagnosis present

## 2025-01-08 DIAGNOSIS — I2510 Atherosclerotic heart disease of native coronary artery without angina pectoris: Secondary | ICD-10-CM | POA: Diagnosis not present

## 2025-01-08 DIAGNOSIS — Z955 Presence of coronary angioplasty implant and graft: Secondary | ICD-10-CM | POA: Diagnosis not present

## 2025-01-08 DIAGNOSIS — J440 Chronic obstructive pulmonary disease with acute lower respiratory infection: Secondary | ICD-10-CM | POA: Insufficient documentation

## 2025-01-08 DIAGNOSIS — Z79899 Other long term (current) drug therapy: Secondary | ICD-10-CM | POA: Insufficient documentation

## 2025-01-08 DIAGNOSIS — E785 Hyperlipidemia, unspecified: Secondary | ICD-10-CM | POA: Insufficient documentation

## 2025-01-08 DIAGNOSIS — E782 Mixed hyperlipidemia: Secondary | ICD-10-CM

## 2025-01-08 DIAGNOSIS — Z9221 Personal history of antineoplastic chemotherapy: Secondary | ICD-10-CM | POA: Diagnosis not present

## 2025-01-08 DIAGNOSIS — Z7982 Long term (current) use of aspirin: Secondary | ICD-10-CM | POA: Insufficient documentation

## 2025-01-08 DIAGNOSIS — Z923 Personal history of irradiation: Secondary | ICD-10-CM | POA: Diagnosis not present

## 2025-01-08 DIAGNOSIS — Z7984 Long term (current) use of oral hypoglycemic drugs: Secondary | ICD-10-CM | POA: Insufficient documentation

## 2025-01-09 ENCOUNTER — Other Ambulatory Visit: Payer: Self-pay

## 2025-01-11 ENCOUNTER — Other Ambulatory Visit: Payer: Self-pay

## 2025-01-14 ENCOUNTER — Other Ambulatory Visit: Payer: Self-pay

## 2025-01-15 ENCOUNTER — Other Ambulatory Visit (HOSPITAL_COMMUNITY): Payer: Self-pay

## 2025-01-15 ENCOUNTER — Telehealth: Payer: Self-pay

## 2025-01-15 NOTE — Telephone Encounter (Signed)
 Received notification via specialty pharmacy encounter that pt's copay for Nucala  is currently $1,475.89. Sent message to pt to see if he would like to enroll in [facility]. Will await pt's response.

## 2025-01-16 ENCOUNTER — Other Ambulatory Visit: Payer: Self-pay

## 2025-01-16 NOTE — Telephone Encounter (Signed)
 Patient reports he did talk to pharmacy team. However, the payment plan is still too much for him.   Please advise.    Copied from CRM [id number]. Topic: Clinical - Prescription Issue >> Jan 16, 2025  4:16 PM Rilla B wrote: Reason for CRM: Patient is calling regarding the Nucala  and states he cannot afford that.  Patient would like to see if there is anything Dr Tamea can do for him.  Please call patient  @ [phone] to discuss option.

## 2025-01-17 ENCOUNTER — Other Ambulatory Visit (HOSPITAL_COMMUNITY): Payer: Self-pay

## 2025-01-17 NOTE — Telephone Encounter (Signed)
 Unfortunately there is nothing else I can do from my end.  He is on maximum therapy for his COPD.

## 2025-01-18 ENCOUNTER — Other Ambulatory Visit: Payer: Self-pay

## 2025-01-18 ENCOUNTER — Other Ambulatory Visit (HOSPITAL_COMMUNITY): Payer: Self-pay

## 2025-01-18 NOTE — Telephone Encounter (Signed)
 I have notified the patient. He will reach out to the pharmacy team about enrolling in the patient assistance program.  Nothing further needed.

## 2025-01-21 ENCOUNTER — Ambulatory Visit: Payer: Medicare Other | Admitting: Radiation Oncology

## 2025-01-22 ENCOUNTER — Other Ambulatory Visit (HOSPITAL_COMMUNITY): Payer: Self-pay

## 2025-01-24 ENCOUNTER — Inpatient Hospital Stay: Admission: EM | Admit: 2025-01-24 | Source: Home / Self Care | Attending: Family Medicine | Admitting: Family Medicine

## 2025-01-24 ENCOUNTER — Other Ambulatory Visit (HOSPITAL_COMMUNITY): Payer: Self-pay

## 2025-01-24 ENCOUNTER — Emergency Department

## 2025-01-24 ENCOUNTER — Other Ambulatory Visit: Payer: Self-pay

## 2025-01-24 DIAGNOSIS — J9611 Chronic respiratory failure with hypoxia: Secondary | ICD-10-CM

## 2025-01-24 DIAGNOSIS — F419 Anxiety disorder, unspecified: Secondary | ICD-10-CM

## 2025-01-24 DIAGNOSIS — I5032 Chronic diastolic (congestive) heart failure: Secondary | ICD-10-CM | POA: Insufficient documentation

## 2025-01-24 DIAGNOSIS — I25118 Atherosclerotic heart disease of native coronary artery with other forms of angina pectoris: Secondary | ICD-10-CM

## 2025-01-24 DIAGNOSIS — J189 Pneumonia, unspecified organism: Secondary | ICD-10-CM | POA: Diagnosis not present

## 2025-01-24 DIAGNOSIS — J441 Chronic obstructive pulmonary disease with (acute) exacerbation: Secondary | ICD-10-CM | POA: Diagnosis not present

## 2025-01-24 DIAGNOSIS — I2510 Atherosclerotic heart disease of native coronary artery without angina pectoris: Secondary | ICD-10-CM | POA: Diagnosis present

## 2025-01-24 DIAGNOSIS — J449 Chronic obstructive pulmonary disease, unspecified: Secondary | ICD-10-CM

## 2025-01-24 DIAGNOSIS — A419 Sepsis, unspecified organism: Secondary | ICD-10-CM | POA: Diagnosis not present

## 2025-01-24 DIAGNOSIS — J188 Other pneumonia, unspecified organism: Secondary | ICD-10-CM

## 2025-01-24 DIAGNOSIS — K219 Gastro-esophageal reflux disease without esophagitis: Secondary | ICD-10-CM | POA: Insufficient documentation

## 2025-01-24 DIAGNOSIS — F32A Depression, unspecified: Secondary | ICD-10-CM | POA: Diagnosis present

## 2025-01-24 LAB — COMPREHENSIVE METABOLIC PANEL WITH GFR
ALT: 29 U/L (ref 0–44)
AST: 17 U/L (ref 15–41)
Albumin: 3.3 g/dL — ABNORMAL LOW (ref 3.5–5.0)
Alkaline Phosphatase: 77 U/L (ref 38–126)
Anion gap: 13 (ref 5–15)
BUN: 9 mg/dL (ref 8–23)
CO2: 32 mmol/L (ref 22–32)
Calcium: 9.3 mg/dL (ref 8.9–10.3)
Chloride: 97 mmol/L — ABNORMAL LOW (ref 98–111)
Creatinine, Ser: 0.75 mg/dL (ref 0.61–1.24)
GFR, Estimated: >60 mL/min
Glucose, Bld: 94 mg/dL (ref 70–99)
Potassium: 3.7 mmol/L (ref 3.5–5.1)
Sodium: 142 mmol/L (ref 135–145)
Total Bilirubin: 0.4 mg/dL (ref 0.0–1.2)
Total Protein: 6.6 g/dL (ref 6.5–8.1)

## 2025-01-24 LAB — CBC
HCT: 38.3 % — ABNORMAL LOW (ref 39.0–52.0)
Hemoglobin: 11.8 g/dL — ABNORMAL LOW (ref 13.0–17.0)
MCH: 29.8 pg (ref 26.0–34.0)
MCHC: 30.8 g/dL (ref 30.0–36.0)
MCV: 96.7 fL (ref 80.0–100.0)
Platelets: 345 10*3/uL (ref 150–400)
RBC: 3.96 MIL/uL — ABNORMAL LOW (ref 4.22–5.81)
RDW: 12.7 % (ref 11.5–15.5)
WBC: 8.1 10*3/uL (ref 4.0–10.5)
nRBC: 0 % (ref 0.0–0.2)

## 2025-01-24 LAB — RESP PANEL BY RT-PCR (RSV, FLU A&B, COVID)  RVPGX2
Influenza A by PCR: NEGATIVE
Influenza B by PCR: NEGATIVE
Resp Syncytial Virus by PCR: NEGATIVE
SARS Coronavirus 2 by RT PCR: NEGATIVE

## 2025-01-24 MED ORDER — METHYLPREDNISOLONE SODIUM SUCC 125 MG IJ SOLR: 125.0000 mg | Freq: Once | INTRAMUSCULAR | Status: DC

## 2025-01-24 MED ORDER — SODIUM CHLORIDE 0.9 % IV SOLN
2.0000 g | Freq: Once | INTRAVENOUS | Status: AC
  Administered 2025-01-24: 2 g via INTRAVENOUS
  Filled 2025-01-24: qty 20

## 2025-01-24 MED ORDER — IPRATROPIUM-ALBUTEROL 0.5-2.5 (3) MG/3ML IN SOLN
3.0000 mL | Freq: Once | RESPIRATORY_TRACT | Status: AC
  Administered 2025-01-24: 3 mL via RESPIRATORY_TRACT
  Filled 2025-01-24: qty 3

## 2025-01-24 MED ORDER — SODIUM CHLORIDE 0.9 % IV SOLN
500.0000 mg | Freq: Once | INTRAVENOUS | Status: AC
  Administered 2025-01-24: 500 mg via INTRAVENOUS
  Filled 2025-01-24: qty 5

## 2025-01-24 NOTE — ED Triage Notes (Signed)
 Patient brought in by EMS from home for worsening SoB x5 days, hx COPD. 3L [place] baseline. Multiple mechanical falls over past few days without injury, pt denies head impact, 81mg  aspirin  daily. Daughter reported to EMS that patient hasn't been acting like himself. Albuterol  and 125mg  solumedrol given by EMS. AxOx4 in triage.

## 2025-01-24 NOTE — ED Provider Notes (Signed)
 "  [facility] Provider Note    Event Date/Time   First MD Initiated Contact with Patient 01/24/25 2211     (approximate)  History   Chief Complaint: Shortness of Breath  HPI  Leslie Sims is a 74 y.o. male with a past medical history of anxiety, depression, COPD on 3 L chronically, CAD, diabetes, hypertension, hyperlipidemia, presents to the emergency department for shortness of breath.  Patient states over the past 3 days he has had progressive worsening shortness of breath.  Patient states some tightness in the chest but denies any pain.  Patient also states he has had anxiety issues and feels like the anxiety might be contributing to his shortness of breath symptoms.  Patient denies any increased cough over baseline.  No known fever.  Physical Exam   Triage Vital Signs: ED Triage Vitals  Encounter Vitals Group     BP 01/24/25 2213 111/81     Girls Systolic BP Percentile --      Girls Diastolic BP Percentile --      Boys Systolic BP Percentile --      Boys Diastolic BP Percentile --      Pulse Rate 01/24/25 2209 91     Resp 01/24/25 2209 18     Temp 01/24/25 2209 99 F (37.2 C)     Temp Source 01/24/25 2209 Oral     SpO2 01/24/25 2209 100 %     Weight 01/24/25 2210 198 lb 4.8 oz (89.9 kg)     Height 01/24/25 2210 5' 10 (1.778 m)     Head Circumference --      Peak Flow --      Pain Score 01/24/25 2210 0     Pain Loc --      Pain Education --      Exclude from Growth Chart --     Most recent vital signs: Vitals:   01/24/25 2209 01/24/25 2213  BP:  111/81  Pulse: 91   Resp: 18   Temp: 99 F (37.2 C)   SpO2: 100%     General: Awake, no distress.  CV:  Good peripheral perfusion.  Regular rate and rhythm  Resp:  Normal effort.  Somewhat diminished breath sounds bilateral with mild expiratory wheeze. Abd:  No distention.  Soft, nontender.  No rebound or guarding.  ED Results / Procedures / Treatments   EKG  EKG viewed and  interpreted by myself shows a normal sinus rhythm at 88 bpm with a narrow QRS, normal axis, normal intervals, no concerning ST changes.  RADIOLOGY  I have reviewed and interpreted the chest x-ray images.  Possible right upper lobe consolidation. Radiology has read the x-ray as multifocal airspace disease consistent with bilateral pneumonia, in addition to emphysema.   MEDICATIONS ORDERED IN ED: Medications  ipratropium-albuterol  (DUONEB) 0.5-2.5 (3) MG/3ML nebulizer solution 3 mL (has no administration in time range)  ipratropium-albuterol  (DUONEB) 0.5-2.5 (3) MG/3ML nebulizer solution 3 mL (has no administration in time range)  methylPREDNISolone  sodium succinate (SOLU-MEDROL ) 125 mg/2 mL injection 125 mg (has no administration in time range)     IMPRESSION / MDM / ASSESSMENT AND PLAN / ED COURSE  I reviewed the triage vital signs and the nursing notes.  Patient's presentation is most consistent with acute presentation with potential threat to life or bodily function.  Patient presents to the emergency department for worsening shortness of breath.  Patient states symptoms have been ongoing/worsening over the past 3 days.  Slight  expiratory wheeze and diminished breath sounds.  We will check labs including cardiac enzyme we will obtain a chest x-ray will dose DuoNebs, Solu-Medrol  and continue to closely monitor.  Patient agreeable to plan of care.  Patient received Solu-Medrol  by EMS we will hold off on additional steroids.  Will dose DuoNebs and continue to close monitor while awaiting results.  Patient's chest x-ray shows multifocal pneumonia.  Lab work is overall reassuring with a normal white blood cell count reassuring CBC reassuring chemistry.  Troponin is pending.  I have added blood cultures and a lactic acid we will cover with antibiotics for multifocal pneumonia.  Patient will be admitted to the hospital service.  FINAL CLINICAL IMPRESSION(S) / ED DIAGNOSES   COPD  exacerbation Dyspnea Multifocal pneumonia  Note:  This document was prepared using Dragon voice recognition software and may include unintentional dictation errors.   Dorothyann Drivers, MD 01/24/25 2304  "

## 2025-01-25 DIAGNOSIS — K219 Gastro-esophageal reflux disease without esophagitis: Secondary | ICD-10-CM | POA: Insufficient documentation

## 2025-01-25 DIAGNOSIS — I5032 Chronic diastolic (congestive) heart failure: Secondary | ICD-10-CM | POA: Insufficient documentation

## 2025-01-25 DIAGNOSIS — J441 Chronic obstructive pulmonary disease with (acute) exacerbation: Secondary | ICD-10-CM

## 2025-01-25 DIAGNOSIS — F32A Depression, unspecified: Secondary | ICD-10-CM | POA: Insufficient documentation

## 2025-01-25 LAB — HEMOGLOBIN A1C
Hgb A1c MFr Bld: 6.7 % — ABNORMAL HIGH (ref 4.8–5.6)
Mean Plasma Glucose: 145.59 mg/dL

## 2025-01-25 LAB — CULTURE, BLOOD (ROUTINE X 2)
Culture: NO GROWTH
Culture: NO GROWTH
Special Requests: ADEQUATE
Special Requests: ADEQUATE

## 2025-01-25 LAB — CBC
HCT: 34.4 % — ABNORMAL LOW (ref 39.0–52.0)
Hemoglobin: 10.8 g/dL — ABNORMAL LOW (ref 13.0–17.0)
MCH: 30.3 pg (ref 26.0–34.0)
MCHC: 31.4 g/dL (ref 30.0–36.0)
MCV: 96.4 fL (ref 80.0–100.0)
Platelets: 344 10*3/uL (ref 150–400)
RBC: 3.57 MIL/uL — ABNORMAL LOW (ref 4.22–5.81)
RDW: 12.8 % (ref 11.5–15.5)
WBC: 7.5 10*3/uL (ref 4.0–10.5)
nRBC: 0 % (ref 0.0–0.2)

## 2025-01-25 LAB — CBG MONITORING, ED
Glucose-Capillary: 237 mg/dL — ABNORMAL HIGH (ref 70–99)
Glucose-Capillary: 163 mg/dL — ABNORMAL HIGH (ref 70–99)

## 2025-01-25 LAB — BASIC METABOLIC PANEL WITH GFR
Anion gap: 10 (ref 5–15)
BUN: 17 mg/dL (ref 8–23)
CO2: 30 mmol/L (ref 22–32)
Calcium: 8.8 mg/dL — ABNORMAL LOW (ref 8.9–10.3)
Chloride: 97 mmol/L — ABNORMAL LOW (ref 98–111)
Creatinine, Ser: 0.89 mg/dL (ref 0.61–1.24)
GFR, Estimated: >60 mL/min
Glucose, Bld: 160 mg/dL — ABNORMAL HIGH (ref 70–99)
Potassium: 4.2 mmol/L (ref 3.5–5.1)
Sodium: 138 mmol/L (ref 135–145)

## 2025-01-25 LAB — GLUCOSE, CAPILLARY
Glucose-Capillary: 107 mg/dL — ABNORMAL HIGH (ref 70–99)
Glucose-Capillary: 134 mg/dL — ABNORMAL HIGH (ref 70–99)

## 2025-01-25 LAB — LACTIC ACID, PLASMA
Lactic Acid, Venous: 0.8 mmol/L (ref 0.5–1.9)
Lactic Acid, Venous: 1.4 mmol/L (ref 0.5–1.9)
Lactic Acid, Venous: 1.9 mmol/L (ref 0.5–1.9)

## 2025-01-25 LAB — TROPONIN T, HIGH SENSITIVITY
Troponin T High Sensitivity: 13 ng/L (ref 0–19)
Troponin T High Sensitivity: 11 ng/L (ref 0–19)

## 2025-01-25 LAB — PROCALCITONIN: Procalcitonin: <0.1 ng/mL

## 2025-01-25 MED ORDER — EZETIMIBE 10 MG PO TABS
10.0000 mg | ORAL_TABLET | Freq: Every day | ORAL | Status: AC
  Administered 2025-01-25: 10 mg via ORAL
  Filled 2025-01-25: qty 1

## 2025-01-25 MED ORDER — ORAL CARE MOUTH RINSE: 15.0000 mL | OROMUCOSAL | Status: AC | PRN

## 2025-01-25 MED ORDER — SODIUM CHLORIDE 0.9 % IV SOLN: 500.0000 mg | INTRAVENOUS | Status: DC

## 2025-01-25 MED ORDER — METHYLPREDNISOLONE SODIUM SUCC 125 MG IJ SOLR: 80.0000 mg | Freq: Every day | INTRAMUSCULAR | Status: AC

## 2025-01-25 MED ORDER — PANTOPRAZOLE SODIUM 40 MG PO TBEC
40.0000 mg | DELAYED_RELEASE_TABLET | Freq: Two times a day (BID) | ORAL | Status: AC
  Administered 2025-01-25 (×2): 40 mg via ORAL
  Filled 2025-01-25 (×3): qty 1

## 2025-01-25 MED ORDER — ALPRAZOLAM 0.5 MG PO TABS
0.5000 mg | ORAL_TABLET | Freq: Three times a day (TID) | ORAL | Status: AC
  Administered 2025-01-25 (×3): 0.5 mg via ORAL
  Filled 2025-01-25 (×3): qty 1

## 2025-01-25 MED ORDER — ATORVASTATIN CALCIUM 80 MG PO TABS
80.0000 mg | ORAL_TABLET | Freq: Every day | ORAL | Status: AC
  Administered 2025-01-25: 80 mg via ORAL
  Filled 2025-01-25: qty 4

## 2025-01-25 MED ORDER — IPRATROPIUM-ALBUTEROL 0.5-2.5 (3) MG/3ML IN SOLN
3.0000 mL | Freq: Four times a day (QID) | RESPIRATORY_TRACT | Status: AC
  Administered 2025-01-25 (×3): 3 mL via RESPIRATORY_TRACT
  Filled 2025-01-25 (×3): qty 3

## 2025-01-25 MED ORDER — ASPIRIN 81 MG PO TBEC
81.0000 mg | DELAYED_RELEASE_TABLET | Freq: Every day | ORAL | Status: AC
  Administered 2025-01-25: 81 mg via ORAL
  Filled 2025-01-25: qty 1

## 2025-01-25 MED ORDER — INSULIN ASPART 100 UNIT/ML IJ SOLN: 0.0000 [IU] | Freq: Every day | INTRAMUSCULAR | Status: AC

## 2025-01-25 MED ORDER — SPIRONOLACTONE 12.5 MG HALF TABLET
12.5000 mg | ORAL_TABLET | Freq: Every day | ORAL | Status: AC
  Administered 2025-01-25: 12.5 mg via ORAL
  Filled 2025-01-25: qty 1

## 2025-01-25 MED ORDER — SACUBITRIL-VALSARTAN 49-51 MG PO TABS
1.0000 | ORAL_TABLET | Freq: Two times a day (BID) | ORAL | Status: AC
  Administered 2025-01-25 (×2): 1 via ORAL
  Filled 2025-01-25 (×2): qty 1

## 2025-01-25 MED ORDER — FUROSEMIDE 20 MG PO TABS: 20.0000 mg | ORAL_TABLET | ORAL | Status: AC | PRN

## 2025-01-25 MED ORDER — ENSIFENTRINE 3 MG/2.5ML IN SUSP: 3.0000 mL | Freq: Two times a day (BID) | RESPIRATORY_TRACT | Status: AC

## 2025-01-25 MED ORDER — NITROGLYCERIN 0.4 MG SL SUBL: 0.4000 mg | SUBLINGUAL_TABLET | SUBLINGUAL | Status: AC | PRN

## 2025-01-25 MED ORDER — GUAIFENESIN ER 600 MG PO TB12
600.0000 mg | ORAL_TABLET | Freq: Two times a day (BID) | ORAL | Status: AC
  Administered 2025-01-25 (×3): 600 mg via ORAL
  Filled 2025-01-25 (×3): qty 1

## 2025-01-25 MED ORDER — LACTATED RINGERS IV BOLUS
500.0000 mL | Freq: Once | INTRAVENOUS | Status: AC
  Administered 2025-01-25: 500 mL via INTRAVENOUS

## 2025-01-25 MED ORDER — MIRTAZAPINE 15 MG PO TABS
45.0000 mg | ORAL_TABLET | Freq: Every day | ORAL | Status: AC
  Administered 2025-01-25 (×2): 45 mg via ORAL
  Filled 2025-01-25 (×2): qty 3

## 2025-01-25 MED ORDER — LACTATED RINGERS IV SOLN: INTRAVENOUS | Status: AC

## 2025-01-25 MED ORDER — DAPAGLIFLOZIN PROPANEDIOL 10 MG PO TABS
10.0000 mg | ORAL_TABLET | Freq: Every day | ORAL | Status: AC
  Administered 2025-01-25: 10 mg via ORAL
  Filled 2025-01-25 (×2): qty 1

## 2025-01-25 MED ORDER — FLUOXETINE HCL 20 MG PO CAPS
20.0000 mg | ORAL_CAPSULE | Freq: Every day | ORAL | Status: AC
  Administered 2025-01-25: 20 mg via ORAL
  Filled 2025-01-25: qty 1

## 2025-01-25 MED ORDER — FLUTICASONE PROPIONATE 50 MCG/ACT NA SUSP: 2.0000 | Freq: Every day | NASAL | Status: AC | PRN

## 2025-01-25 MED ORDER — INSULIN ASPART 100 UNIT/ML IJ SOLN
0.0000 [IU] | Freq: Three times a day (TID) | INTRAMUSCULAR | Status: AC
  Administered 2025-01-25: 4 [IU] via SUBCUTANEOUS
  Administered 2025-01-25: 7 [IU] via SUBCUTANEOUS
  Filled 2025-01-25: qty 4
  Filled 2025-01-25: qty 7

## 2025-01-25 MED ORDER — BISOPROLOL FUMARATE 5 MG PO TABS
2.5000 mg | ORAL_TABLET | Freq: Every day | ORAL | Status: AC
  Administered 2025-01-25: 2.5 mg via ORAL
  Filled 2025-01-25: qty 0.5

## 2025-01-25 MED ORDER — FAMOTIDINE 20 MG PO TABS: 20.0000 mg | ORAL_TABLET | Freq: Two times a day (BID) | ORAL | Status: AC | PRN

## 2025-01-25 MED ORDER — SODIUM CHLORIDE 0.9 % IV SOLN
1.0000 g | INTRAVENOUS | Status: AC
  Administered 2025-01-25: 1 g via INTRAVENOUS
  Filled 2025-01-25: qty 10

## 2025-01-25 MED ORDER — HYDROCOD POLI-CHLORPHE POLI ER 10-8 MG/5ML PO SUER: 5.0000 mL | Freq: Two times a day (BID) | ORAL | Status: AC | PRN

## 2025-01-25 MED ORDER — SODIUM CHLORIDE 0.9 % IV SOLN
100.0000 mg | Freq: Two times a day (BID) | INTRAVENOUS | Status: AC
  Administered 2025-01-25: 100 mg via INTRAVENOUS
  Filled 2025-01-25: qty 100

## 2025-01-25 MED ORDER — IPRATROPIUM-ALBUTEROL 0.5-2.5 (3) MG/3ML IN SOLN: 3.0000 mL | RESPIRATORY_TRACT | Status: AC | PRN

## 2025-01-25 NOTE — Assessment & Plan Note (Signed)
-   Will continue aspirin , beta-blocker therapy and statin therapy.

## 2025-01-25 NOTE — Assessment & Plan Note (Signed)
-   Will continue statin therapy anxiety.

## 2025-01-25 NOTE — Assessment & Plan Note (Signed)
-   The patient will be admitted to a medical telemetry bed. - Will continue antibiotic therapy with IV Rocephin  and p.o. Zithromax . - Mucolytic therapy be provided as well as duo nebs q.i.d. and q.4 hours p.r.n. - We will follow blood cultures.

## 2025-01-25 NOTE — Plan of Care (Signed)
  Problem: Education: Goal: Ability to describe self-care measures that may prevent or decrease complications (Diabetes Survival Skills Education) will improve Outcome: Progressing Goal: Individualized Educational Video(s) Outcome: Progressing   Problem: Coping: Goal: Ability to adjust to condition or change in health will improve Outcome: Progressing   Problem: Nutritional: Goal: Maintenance of adequate nutrition will improve Outcome: Progressing Goal: Progress toward achieving an optimal weight will improve Outcome: Progressing   Problem: Clinical Measurements: Goal: Ability to maintain clinical measurements within normal limits will improve Outcome: Progressing Goal: Will remain free from infection Outcome: Progressing Goal: Diagnostic test results will improve Outcome: Progressing Goal: Respiratory complications will improve Outcome: Progressing Goal: Cardiovascular complication will be avoided Outcome: Progressing   Problem: Activity: Goal: Risk for activity intolerance will decrease Outcome: Progressing   Problem: Safety: Goal: Ability to remain free from injury will improve Outcome: Progressing

## 2025-01-25 NOTE — Assessment & Plan Note (Signed)
 Bronchodilator therapy and mucolytic therapy will be provided as mentioned above in addition to antibiotic therapy.

## 2025-01-25 NOTE — Progress Notes (Signed)
 " Progress Note   Patient: Leslie Sims [facility] FMW:[id number] DOB: 1951/03/22 DOA: 01/24/2025     1 DOS: the patient was seen and examined on 01/25/2025   Brief hospital course: Pt has been admitted for sepsis due to multifocal pneumonia with COPD exacerbation   Assessment and Plan: Sepsis due to pneumonia (HCC) BP is low at 91/73, Give IVF bolus, Check lactic acid, procalcitonin  Continue with IV Rocephin  , change Zithromax  to PO Doxycycline  BID Give continuous IVFs after IVF bolus Mucolytic therapy be provided  Duo nebs q.i.d. and q.4 hours p.r.n. Follow blood cultures.  COPD with acute exacerbation (HCC) Continue O2 therapy, on 3LPM O2 [place] Give IV solumedrol Bronchodilator therapy and mucolytic therapy Antibiotics as above  Anxiety and depression continue Xanax  and Prozac .  GERD without esophagitis continue PPI therapy and H2 blocker therapy.  Chronic diastolic CHF (congestive heart failure) (HCC) Continue Zebeta  Aldactone  and Entresto .  Coronary artery disease: Stable Continue aspirin , beta-blocker therapy and statin therapy.      Subjective:   Physical Exam: Vitals:   01/25/25 0930 01/25/25 1000 01/25/25 1030 01/25/25 1602  BP: 121/66 118/70 91/73 115/63  Pulse: 83 89 86 79  Resp: (!) 32 18 20 18   Temp:    97.9 F (36.6 C)  TempSrc:    Oral  SpO2: 93% (!) 86% (!) 87% 93%  Weight:    83.1 kg  Height:    5' 10 (1.778 m)   Constitutional:      Appearance: Normal appearance. He is ill-appearing.  HENT:     Head: Normocephalic and atraumatic.     Nose: Nose normal. No congestion.     Mouth/Throat:     Pharynx: Oropharynx is clear. No oropharyngeal exudate.  Eyes:     Extraocular Movements: Extraocular movements intact.     Conjunctiva/sclera: Conjunctivae normal.  Cardiovascular:     Rate and Rhythm: Normal rate and regular rhythm.     Pulses: Normal pulses.     Heart sounds: Normal heart sounds.  Pulmonary:     Effort: on 3L/min O2 nasal cannula     Breath sounds: Has rhonchi b/l Abdominal:     General: Obese abdomen, Bowel sounds are normal.     Palpations: Abdomen is soft.  Musculoskeletal:        General: Normal range of motion.     Cervical back: Normal range of motion and neck supple.     Right lower leg:     Left lower leg:  Skin:    General: Skin is warm     Capillary Refill: Capillary refill takes 2 to 3 seconds.  Neurological:     General: No focal deficit present.     Mental Status: She is alert and oriented to person, place, and time. Mental status is at baseline.  Psychiatric:        Mood and Affect: Mood normal.        Behavior: Behavior normal.   Data Reviewed:  Latest Reference Range & Units 01/24/25 22:09  Sodium 135 - 145 mmol/L 142  Potassium 3.5 - 5.1 mmol/L 3.7  Chloride 98 - 111 mmol/L 97 (L)  CO2 22 - 32 mmol/L 32  Glucose 70 - 99 mg/dL 94  BUN 8 - 23 mg/dL 9  Creatinine 9.38 - 8.75 mg/dL 9.24  Calcium  8.9 - 10.3 mg/dL 9.3  Anion gap 5 - 15  13  Alkaline Phosphatase 38 - 126 U/L 77  Albumin 3.5 - 5.0 g/dL 3.3 (L)  AST  15 - 41 U/L 17  ALT 0 - 44 U/L 29  Total Protein 6.5 - 8.1 g/dL 6.6  Total Bilirubin 0.0 - 1.2 mg/dL 0.4  GFR, Estimated >39 mL/min >60  (L): Data is abnormally low  Family Communication:   Disposition: Status is: Inpatient   Planned Discharge Destination: Home    Time spent: 35 minutes  Author: Terral DELENA Seashore, MD 01/25/2025 4:58 PM  For on call review [email or web address].  "

## 2025-01-25 NOTE — Assessment & Plan Note (Signed)
-   Will continue PPI therapy and H2 blocker therapy.

## 2025-01-25 NOTE — H&P (Signed)
 "     [place]   PATIENT NAME: Leslie Sims    MR#:  [id number]  DATE OF BIRTH:  04-14-1951  DATE OF ADMISSION:  01/24/2025  PRIMARY CARE PHYSICIAN: Claudene Rayfield HERO, MD   Patient is coming from: Home  REQUESTING/REFERRING PHYSICIAN: Dorothyann Drivers, MD  CHIEF COMPLAINT:   Chief Complaint  Patient presents with   Shortness of Breath    HISTORY OF PRESENT ILLNESS:  Leslie Sims is a 74 y.o. male with medical history significant for anxiety, depression, COPD, coronary artery disease, type 2 diabetes mellitus, GERD, HFrEF, hypertension, dyslipidemia, and peptic ulcer disease, presented to the emergency room with acute onset of worsening cough that is mainly dry with associated dyspnea and wheezing over the last week.  He denies any fever or chills.  No chest pain or palpitations.  No dysuria, oliguria or hematuria or flank pain.  No nausea or vomiting or abdominal pain or diarrhea.  No bleeding diathesis.  ED Course: When the patient came to the ER, he was.  Thana rate was 27 and later on 20 and otherwise vital signs were within normal.  Labs revealed chloride of 97 and albumin 3.3 otherwise unremarkable CMP.  CBC showed mild anemia with hemoglobin 11.8 hematocrit 38.3. EKG as reviewed by me :   EKG showed normal sinus rhythm with a rate of 88 and Q waves anteriorly. Imaging: Portable chest x-ray showed the following: 1. Multifocal bilateral airspace disease, greatest in the right upper lobe, consistent with bilateral pneumonia. 2. Background emphysema.   The patient was given IV Rocephin  for max as well as 2 DuoNebs, and will be admitted to a telemetry bed for further evaluation and management. PAST MEDICAL HISTORY:   Past Medical History:  Diagnosis Date   Allergic rhinitis    Anxiety and depression    Back pain    Cancer (HCC)    prostate   Colon polyps 08/2012   colonoscopy; multiple colon polyps; repeat colonoscopy in one year.   COPD (chronic  obstructive pulmonary disease) (HCC)    a. on home O2.   Coronary artery disease 11/2009   a. late presenting ant MI; b. LHC 100% pLAD s/p PCI/DES, 99% mRCA s/p PCI/DES, EF 35%; c. nuclear stress test 05/13: prior ant/inf infarcts w/o ischemia, EF 43%; d. LHC 02/14: widely patent stents with no other obs dz, EF 40%; e. LHC 11/17: LM nl, mLAD 10%, patent LAD stent, dLAD 20%, p-mRCA 10%, mRCA 40%, patent RCA stent, EF 35-45%    Diabetes (HCC)    Dysphagia    GERD (gastroesophageal reflux disease)    Headache(784.0)    Helicobacter pylori (H. pylori)    Hernia    inguinal   HFrEF (heart failure with reduced ejection fraction) (HCC)    a. 10/2016 LV gram: EF 35-45%; b. 06/2018 Echo: EF 40-45%. c. 01/2021 Echo: EF 40-45%   Hyperlipidemia    Hypertension    Insomnia    Myalgia    Peptic ulcer    Pulmonary nodules    Status post dilation of esophageal narrowing 2000   Thickening of esophagus    Tinea pedis    Wears dentures    partial upper    PAST SURGICAL HISTORY:   Past Surgical History:  Procedure Laterality Date    coronary stents     Admission  12/20/2012   COPD exacerbation.  [facility].   CARDIAC CATHETERIZATION     CARDIAC CATHETERIZATION  02/05/2013   [facility]   CARDIAC  CATHETERIZATION  09/19/2013   [facility] : patent stents with no change in anatomy. EF: 40$   CARDIAC CATHETERIZATION Left 11/12/2016   Procedure: Left Heart Cath and Coronary Angiography;  Surgeon: Deatrice DELENA Cage, MD;  Location: [facility] INVASIVE CV LAB;  Service: Cardiovascular;  Laterality: Left;   COLONOSCOPY     COLONOSCOPY WITH PROPOFOL  N/A 05/18/2016   Procedure: COLONOSCOPY WITH PROPOFOL ;  Surgeon: Rogelia Copping, MD;  Location: [facility] ENDOSCOPY;  Service: Endoscopy;  Laterality: N/A;   COLONOSCOPY WITH PROPOFOL  N/A 03/19/2020   Procedure: COLONOSCOPY WITH PROPOFOL ;  Surgeon: Unk Corinn Skiff, MD;  Location: [facility] ENDOSCOPY;  Service: Gastroenterology;  Laterality: N/A;   CORONARY ANGIOPLASTY WITH STENT PLACEMENT   09/19/2009   LAD 3.0 X23 mm Xience DES, RCA: 4.0 X 15 mm Xience DES   ELECTROMAGNETIC NAVIGATION BROCHOSCOPY N/A 10/27/2017   Procedure: ELECTROMAGNETIC NAVIGATION BRONCHOSCOPY;  Surgeon: Isaiah Scrivener, MD;  Location: [facility] ORS;  Service: Cardiopulmonary;  Laterality: N/A;   ESOPHAGEAL DILATION     ESOPHAGOGASTRODUODENOSCOPY  12/20/2006   ESOPHAGOGASTRODUODENOSCOPY  08/20/2012   ESOPHAGOGASTRODUODENOSCOPY (EGD) WITH PROPOFOL  N/A 04/19/2022   Procedure: ESOPHAGOGASTRODUODENOSCOPY (EGD) WITH PROPOFOL ;  Surgeon: Maryruth Ole DASEN, MD;  Location: [facility] ENDOSCOPY;  Service: Endoscopy;  Laterality: N/A;   ESOPHAGOGASTRODUODENOSCOPY (EGD) WITH PROPOFOL  N/A 04/27/2022   Procedure: ESOPHAGOGASTRODUODENOSCOPY (EGD) WITH PROPOFOL ;  Surgeon: Maryruth Ole DASEN, MD;  Location: [facility] ENDOSCOPY;  Service: Endoscopy;  Laterality: N/A;   ESOPHAGOGASTRODUODENOSCOPY (EGD) WITH PROPOFOL  N/A 11/08/2022   Procedure: ESOPHAGOGASTRODUODENOSCOPY (EGD) WITH PROPOFOL ;  Surgeon: Maryruth Ole DASEN, MD;  Location: [facility] ENDOSCOPY;  Service: Endoscopy;  Laterality: N/A;   EUS N/A 05/20/2022   Procedure: UPPER ENDOSCOPIC ULTRASOUND (EUS) LINEAR;  Surgeon: Elta Fonda SQUIBB, MD;  Location: [facility] ENDOSCOPY;  Service: Gastroenterology;  Laterality: N/A;  [facility]   HERNIA REPAIR  07/20/2012   L inguinal hernia repair   PENILE PROSTHESIS IMPLANT     RIGHT/LEFT HEART CATH AND CORONARY ANGIOGRAPHY Bilateral 04/04/2023   Procedure: RIGHT/LEFT HEART CATH AND CORONARY ANGIOGRAPHY;  Surgeon: Cage Deatrice DELENA, MD;  Location: [facility] INVASIVE CV LAB;  Service: Cardiovascular;  Laterality: Bilateral;    SOCIAL HISTORY:   Social History   Tobacco Use   Smoking status: Former    Current packs/day: 0.00    Average packs/day: 3.0 packs/day for 42.0 years (126.0 ttl pk-yrs)    Types: Cigarettes    Start date: 02/10/1977    Quit date: 02/10/2019    Years since quitting: 5.9   Smokeless tobacco: Never   Tobacco comments:    25 months ago  most smoked 4 packs a day .  Substance Use Topics   Alcohol use: No    Alcohol/week: 0.0 standard drinks of alcohol    FAMILY HISTORY:   Family History  Problem Relation Age of Onset   Heart attack Brother        Brother #1   Diabetes Brother    Hypertension Brother        #3   Coronary artery disease Father 73       deceased   Heart attack Father    Diabetes Father    Heart disease Father    COPD Mother 73       deceased   Alcohol abuse Sister        polysubstance abuse   COPD Sister    Lung cancer Sister    Alcohol abuse Sister        polysubstance abuse   Penile cancer Brother    Diabetes  Brother    Prostate cancer Neg Hx    Bladder Cancer Neg Hx    Kidney cancer Neg Hx     DRUG ALLERGIES:  Allergies[1]  REVIEW OF SYSTEMS:   ROS As per history of present illness. All pertinent systems were reviewed above. Constitutional, HEENT, cardiovascular, respiratory, GI, GU, musculoskeletal, neuro, psychiatric, endocrine, integumentary and hematologic systems were reviewed and are otherwise negative/unremarkable except for positive findings mentioned above in the HPI.   MEDICATIONS AT HOME:   Prior to Admission medications  Medication Sig Start Date End Date Taking? Authorizing Provider  albuterol  (VENTOLIN  HFA) 108 (90 Base) MCG/ACT inhaler TAKE 2 PUFFS BY MOUTH EVERY 6 HOURS AS NEEDED FOR WHEEZE OR SHORTNESS OF BREATH 11/01/24   Tamea Dedra CROME, MD  ALPRAZolam  (XANAX ) 0.5 MG tablet Take 0.5 mg by mouth 3 (three) times daily. 03/16/23   [provider]  aspirin  81 MG tablet Take 81 mg by mouth daily.    [provider]  atorvastatin  (LIPITOR ) 80 MG tablet Take 1 tablet (80 mg total) by mouth daily. 03/18/21   Abigail Bernardino HERO, PA-C  bisoprolol  (ZEBETA ) 5 MG tablet TAKE 1/2 TABLET BY MOUTH DAILY 08/16/24   Rolan Ezra RAMAN, MD  budesonide  (PULMICORT ) 0.5 MG/2ML nebulizer solution Take 2 mLs (0.5 mg total) by nebulization in the morning and at bedtime. 06/19/24    Tamea Dedra CROME, MD  dapagliflozin  propanediol (FARXIGA ) 10 MG TABS tablet TAKE 1 TABLET BY MOUTH DAILY BEFORE BREAKFAST. 06/11/24   Rolan Ezra RAMAN, MD  Ensifentrine  (OHTUVAYRE ) 3 MG/2.5ML SUSP Inhale 3 mLs into the lungs 2 (two) times daily. DX:J44.9 10/29/24   Tamea Dedra CROME, MD  ezetimibe  (ZETIA ) 10 MG tablet Take 1 tablet (10 mg total) by mouth daily. 10/23/24 01/21/25  Abigail Bernardino HERO, PA-C  famotidine  (PEPCID ) 20 MG tablet TAKE 1 TABLET (20 MG TOTAL) BY MOUTH 2 (TWO) TIMES DAILY. FOR BREAKTHROUGH REFLUX 07/23/24   Tamea Dedra CROME, MD  FLUoxetine  (PROZAC ) 20 MG tablet Take 20 mg by mouth daily. 02/04/24   [provider]  fluticasone  (FLONASE ) 50 MCG/ACT nasal spray Place 2 sprays into both nostrils daily. 06/08/21   [provider]  furosemide  (LASIX ) 20 MG tablet Take 1 tablet (20 mg total) by mouth daily. Patient taking differently: Take 20 mg by mouth as needed for fluid. 09/18/24   Josette Ade, MD  ipratropium-albuterol  (DUONEB) 0.5-2.5 (3) MG/3ML SOLN Take 3 mLs by nebulization every 6 (six) hours as needed. 03/06/24   Tamea Dedra CROME, MD  ketoconazole  (NIZORAL ) 2 % cream Apply 1 Application topically daily. 09/10/22   [provider]  Mepolizumab  (NUCALA ) 100 MG/ML SOAJ Inject 1 mL (100 mg total) into the skin every 28 (twenty-eight) days. 09/24/24   Tamea Dedra CROME, MD  methylPREDNISolone  (MEDROL  DOSEPAK) 4 MG TBPK tablet Take as directed in the package.  This is a taper pack. Hold regular prednisone  dose while taking this. 01/03/25   Tamea Dedra CROME, MD  mirtazapine  (REMERON ) 45 MG tablet Take 45 mg by mouth at bedtime. 06/29/22   [provider]  Multiple Vitamin (MULTIVITAMIN) capsule Take 1 capsule by mouth daily.    [provider]  nitroGLYCERIN  (NITROSTAT ) 0.4 MG SL tablet Place 1 tablet (0.4 mg total) under the tongue every 5 (five) minutes as needed. 07/28/22   Furth, Cadence H, PA-C  OXYGEN  Inhale 3 L into the lungs daily.     [provider]  pantoprazole  (PROTONIX ) 40 MG tablet Take 40 mg by  mouth 2 (two) times daily before a meal. 01/26/24 01/25/25  [provider]  predniSONE  (DELTASONE ) 10 MG tablet TAKE 1 TABLET (10 MG TOTAL) BY MOUTH DAILY WITH BREAKFAST. 12/31/24   Tamea Dedra CROME, MD  sacubitril -valsartan  (ENTRESTO ) 49-51 MG TAKE 1 TABLET BY MOUTH TWICE A DAY 11/27/24   Rolan Ezra RAMAN, MD  Spacer/Aero-Holding Chambers (AEROCHAMBER MV) inhaler Use as instructed 05/04/21   Bernardino Ditch, NP  spironolactone  (ALDACTONE ) 25 MG tablet Take 0.5 tablets (12.5 mg total) by mouth daily. 10/23/24 01/21/25  Abigail Bernardino HERO, PA-C  prochlorperazine  (COMPAZINE ) 10 MG tablet Take 1 tablet (10 mg total) by mouth every 6 (six) hours as needed (Nausea or vomiting). 05/31/22 08/29/22  Babara Call, MD      VITAL SIGNS:  Blood pressure 111/81, pulse 91, temperature 99 F (37.2 C), temperature source Oral, resp. rate 18, height 5' 10 (1.778 m), weight 89.9 kg, SpO2 100%.  PHYSICAL EXAMINATION:  Physical Exam  GENERAL:  74 y.o.-year-old patient lying in the bed with no acute distress.  EYES: Pupils equal, round, reactive to light and accommodation. No scleral icterus. Extraocular muscles intact.  HEENT: Head atraumatic, normocephalic. Oropharynx and nasopharynx clear.  NECK:  Supple, no jugular venous distention. No thyroid enlargement, no tenderness.  LUNGS: Diminished bibasilar breath sounds with bibasilar crackles and diffuse expiratory wheezes with tight expiratory airflow and a harsh vesicular breathing.. No use of accessory muscles of respiration.  CARDIOVASCULAR: Regular rate and rhythm, S1, S2 normal. No murmurs, rubs, or gallops.  ABDOMEN: Soft, nondistended, nontender. Bowel sounds present. No organomegaly or mass.  EXTREMITIES: No pedal edema, cyanosis, or clubbing.  NEUROLOGIC: Cranial nerves II through XII are intact. Muscle strength 5/5 in all extremities. Sensation intact. Gait not checked.  PSYCHIATRIC:  The patient is alert and oriented x 3.  Normal affect and good eye contact. SKIN: No obvious rash, lesion, or ulcer.   LABORATORY PANEL:   CBC Recent Labs  Lab 01/24/25 2209  WBC 8.1  HGB 11.8*  HCT 38.3*  PLT 345   ------------------------------------------------------------------------------------------------------------------  Chemistries  Recent Labs  Lab 01/24/25 2209  NA 142  K 3.7  CL 97*  CO2 32  GLUCOSE 94  BUN 9  CREATININE 0.75  CALCIUM  9.3  AST 17  ALT 29  ALKPHOS 77  BILITOT 0.4   ------------------------------------------------------------------------------------------------------------------  Cardiac Enzymes No results for input(s): TROPONINI in the last 168 hours. ------------------------------------------------------------------------------------------------------------------  RADIOLOGY:  DG Chest Portable 1 View Result Date: 01/24/2025 CLINICAL DATA:  Worsening shortness of breath for 5 days, history of COPD EXAM: PORTABLE CHEST 1 VIEW COMPARISON:  12/25/2024 FINDINGS: Single frontal view of the chest demonstrates a stable cardiac silhouette. Since the previous exam, multifocal bilateral airspace disease has developed. There is patchy bibasilar consolidation, with dense consolidation at the right apex. No effusion or pneumothorax. Stable emphysema. No acute bony abnormalities. IMPRESSION: 1. Multifocal bilateral airspace disease, greatest in the right upper lobe, consistent with bilateral pneumonia. 2. Background emphysema. Electronically Signed   By: Ozell Daring M.D.   On: 01/24/2025 22:31      IMPRESSION AND PLAN:  Assessment and Plan: * Sepsis due to pneumonia [facility]) - The patient will be admitted to a medical telemetry bed. - Will continue antibiotic therapy with IV Rocephin  and p.o. Zithromax . - Mucolytic therapy be provided as well as duo nebs q.i.d. and q.4 hours p.r.n. - We will follow blood cultures.   COPD with acute exacerbation  (HCC) Bronchodilator therapy and mucolytic therapy will be provided  as mentioned above in addition to antibiotic therapy.  Anxiety and depression - Will continue Xanax  and Prozac .  GERD without esophagitis Will continue PPI therapy and H2 blocker therapy.  Chronic diastolic CHF (congestive heart failure) (HCC) - Will continue Zebeta  Aldactone  and Entresto .  Coronary artery disease - Will continue aspirin , beta-blocker therapy and statin therapy.     DVT prophylaxis: Lovenox .  Advanced Care Planning:  Code Status: full code.  Family Communication:  The plan of care was discussed in details with the patient (and family). I answered all questions. The patient agreed to proceed with the above mentioned plan. Further management will depend upon hospital course. Disposition Plan: Back to previous home environment Consults called: none.  All the records are reviewed and case discussed with ED provider.  Status is: Inpatient  At the time of the admission, it appears that the appropriate admission status for this patient is inpatient.  This is judged to be reasonable and necessary in order to provide the required intensity of service to ensure the patient's safety given the presenting symptoms, physical exam findings and initial radiographic and laboratory data in the context of comorbid conditions.  The patient requires inpatient status due to high intensity of service, high risk of further deterioration and high frequency of surveillance required.  I certify that at the time of admission, it is my clinical judgment that the patient will require inpatient hospital care extending more than 2 midnights.                            Dispo: The patient is from: Home              Anticipated d/c is to: Home              Patient currently is not medically stable to d/c.              Difficult to place patient: No  [place]ison DELENA Peaches M.D on 01/25/2025 at 12:21 AM  [facility]   From 7 PM-7 AM,  contact night-coverage [email or web address]  CC: Primary care physician; Claudene Rayfield HERO, MD     [1]  Allergies Allergen Reactions   Prozac  [Fluoxetine  Hcl] Shortness Of Breath   Hydroxyzine     Bupropion Other (See Comments)    Makes me feel funny Other reaction(s): Other (See Comments) Unknown/made me feel real funny Makes me feel funny Makes me feel funny Other reaction(s): Other (See Comments) Unknown/made me feel real funny Makes me feel funny   Effexor Xr [Venlafaxine Hcl Er] Other (See Comments)    Makes me feel funny   "

## 2025-01-25 NOTE — Assessment & Plan Note (Addendum)
-   Will continue Xanax  and Prozac .

## 2025-01-25 NOTE — Assessment & Plan Note (Signed)
-   Will continue Zebeta  Aldactone  and Entresto .

## 2025-02-11 ENCOUNTER — Ambulatory Visit: Admitting: Radiation Oncology

## 2025-03-04 ENCOUNTER — Ambulatory Visit: Admitting: Pulmonary Disease

## 2025-03-04 ENCOUNTER — Other Ambulatory Visit

## 2025-03-11 ENCOUNTER — Other Ambulatory Visit

## 2025-03-18 ENCOUNTER — Ambulatory Visit: Admitting: Oncology

## 2025-03-26 ENCOUNTER — Ambulatory Visit: Admit: 2025-03-26 | Admitting: Gastroenterology

## 2025-03-26 SURGERY — COLONOSCOPY
Anesthesia: General

## 2025-05-01 ENCOUNTER — Ambulatory Visit: Admitting: Cardiology
